# Patient Record
Sex: Male | Born: 1950
Health system: Southern US, Community
[De-identification: ages and names within clinical notes are randomized; demographics above are authoritative.]

## PROBLEM LIST (undated history)

## (undated) DIAGNOSIS — I10 Essential (primary) hypertension: Secondary | ICD-10-CM

## (undated) DIAGNOSIS — E119 Type 2 diabetes mellitus without complications: Secondary | ICD-10-CM

## (undated) DIAGNOSIS — E7800 Pure hypercholesterolemia, unspecified: Secondary | ICD-10-CM

## (undated) DIAGNOSIS — E1110 Type 2 diabetes mellitus with ketoacidosis without coma: Secondary | ICD-10-CM

## (undated) HISTORY — DX: Type 2 diabetes mellitus with ketoacidosis without coma: E11.10

## (undated) HISTORY — PX: KNEE SURGERY: SHX244

---

## 2004-09-25 ENCOUNTER — Other Ambulatory Visit: Payer: Self-pay

## 2004-09-25 ENCOUNTER — Emergency Department: Payer: Self-pay | Admitting: Emergency Medicine

## 2005-03-01 ENCOUNTER — Emergency Department: Payer: Self-pay | Admitting: Internal Medicine

## 2005-09-13 ENCOUNTER — Emergency Department: Payer: Self-pay | Admitting: Emergency Medicine

## 2005-10-05 ENCOUNTER — Ambulatory Visit: Payer: Self-pay | Admitting: Family Medicine

## 2006-05-31 DIAGNOSIS — E1040 Type 1 diabetes mellitus with diabetic neuropathy, unspecified: Secondary | ICD-10-CM | POA: Insufficient documentation

## 2006-06-07 DIAGNOSIS — I10 Essential (primary) hypertension: Secondary | ICD-10-CM | POA: Insufficient documentation

## 2006-06-28 ENCOUNTER — Ambulatory Visit: Payer: Self-pay | Admitting: Unknown Physician Specialty

## 2006-06-28 ENCOUNTER — Other Ambulatory Visit: Payer: Self-pay

## 2006-09-25 ENCOUNTER — Emergency Department: Payer: Self-pay | Admitting: Internal Medicine

## 2006-09-28 ENCOUNTER — Ambulatory Visit: Payer: Self-pay | Admitting: Ophthalmology

## 2006-09-28 IMAGING — CT CT ORBITS WITH CONTRAST
1 series · 15 of 30 positions shown, 19 images · non-contrast
Comparison: none

REASON FOR EXAM: orbittal cellulits    CALL report  [PHONE_NUMBER]
COMMENTS:

[Series 2: orbits_w 3.0 h30f · axial · 0.33mm/px · z∈[+364,+460]mm · 15 of 36 slices shown, 19 images]
[im 2/36  brain]
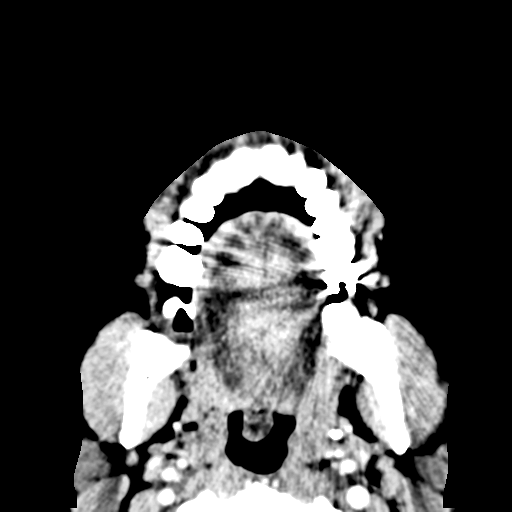
[im 2/36  bone]
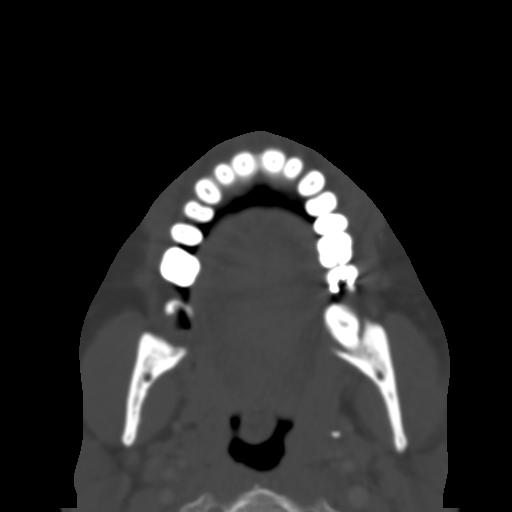
[im 4/36  bone]
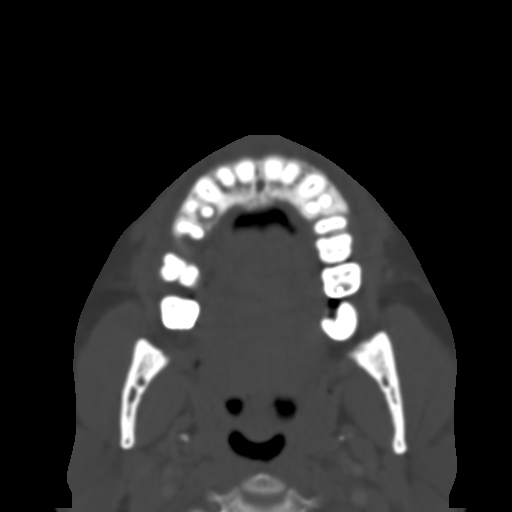
[im 7/36  bone]
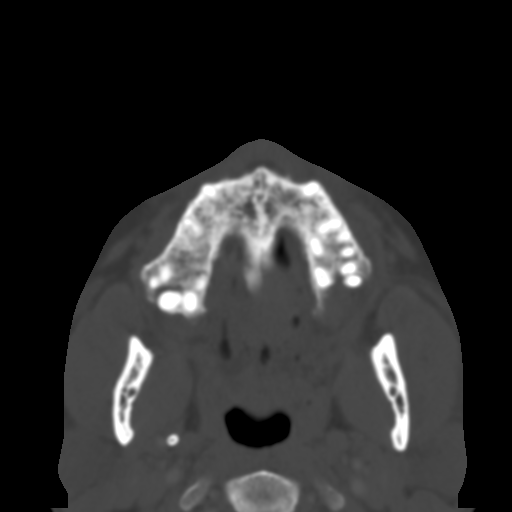
[im 9/36  bone]
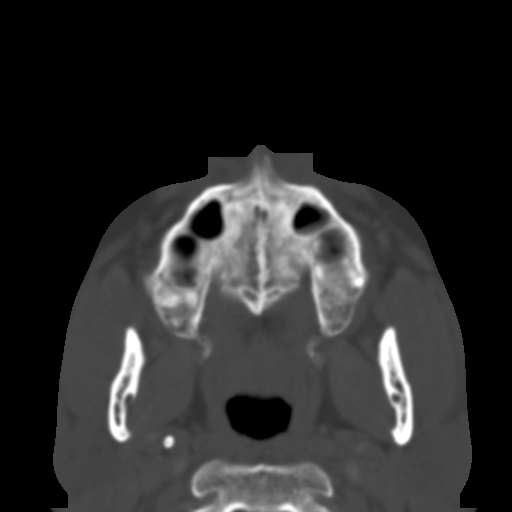
[im 11/36  brain]
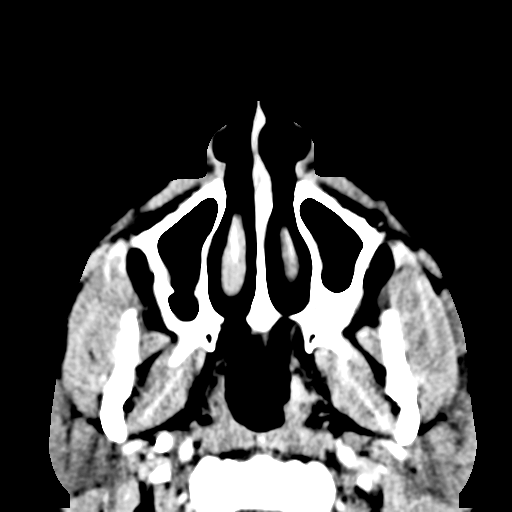
[im 11/36  bone]
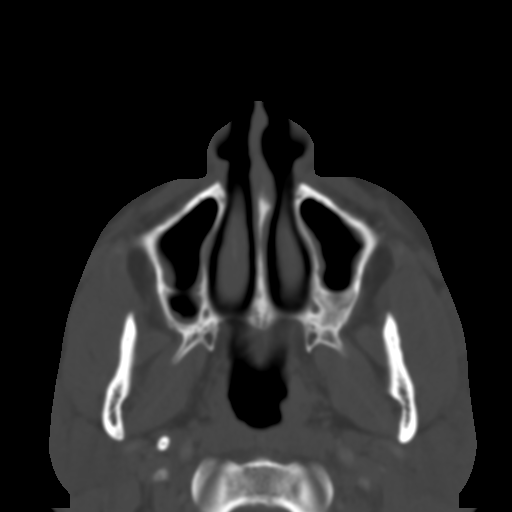
[im 14/36  bone]
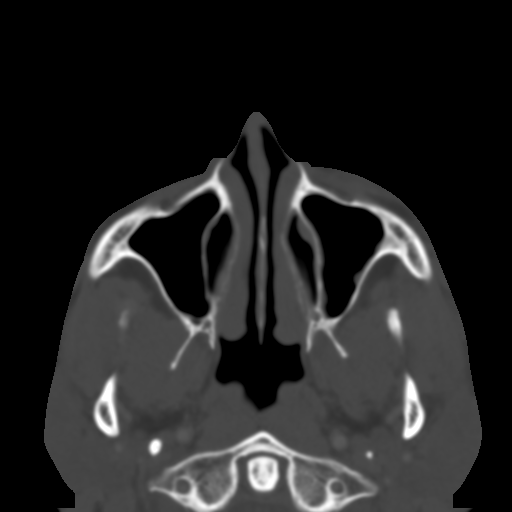
[im 16/36  bone]
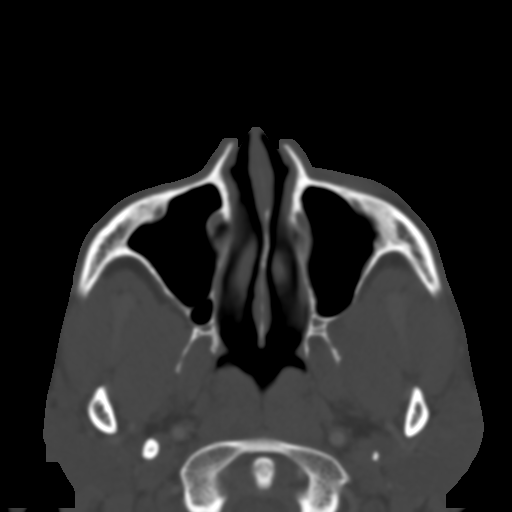
[im 19/36  bone]
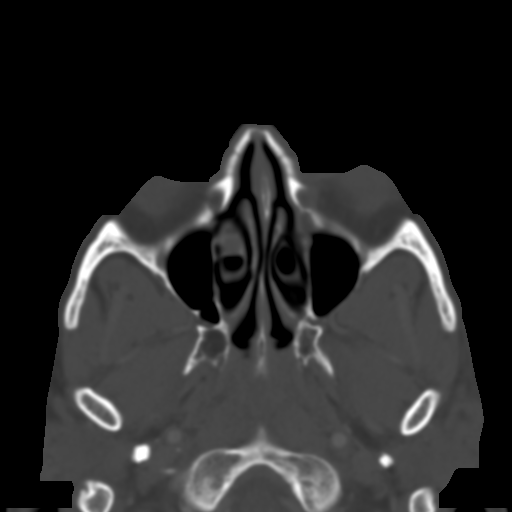
[im 20/36  brain]
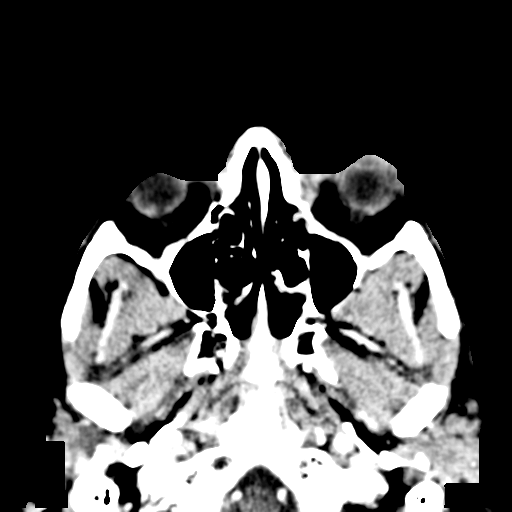
[im 20/36  bone]
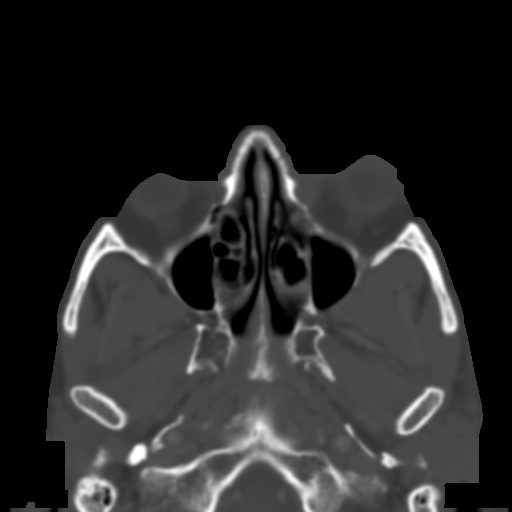
[im 22/36  bone]
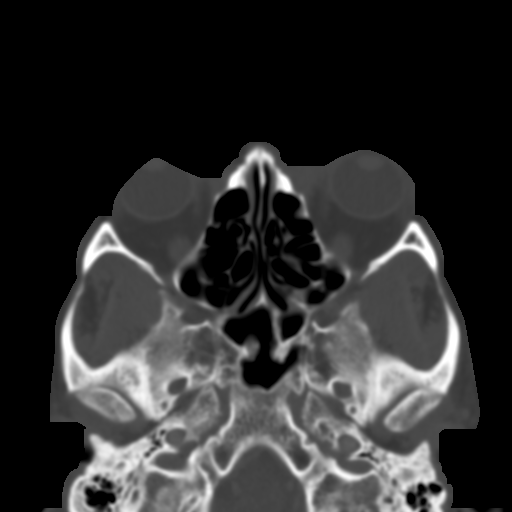
[im 25/36  bone]
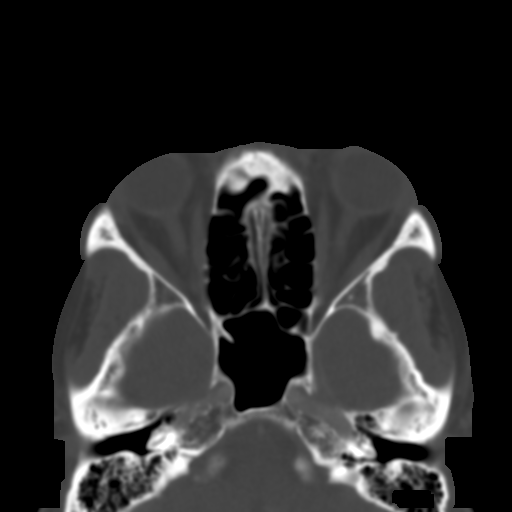
[im 27/36  bone]
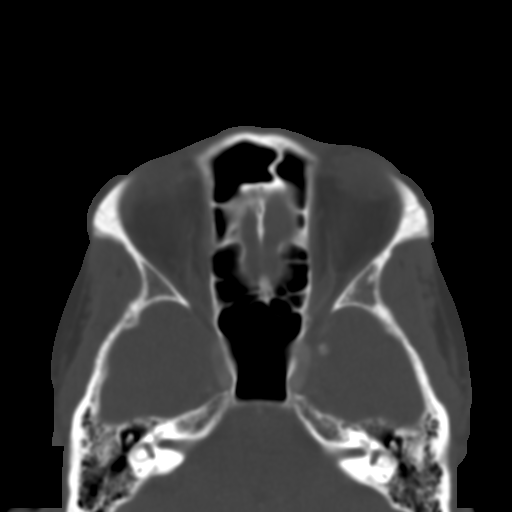
[im 29/36  brain]
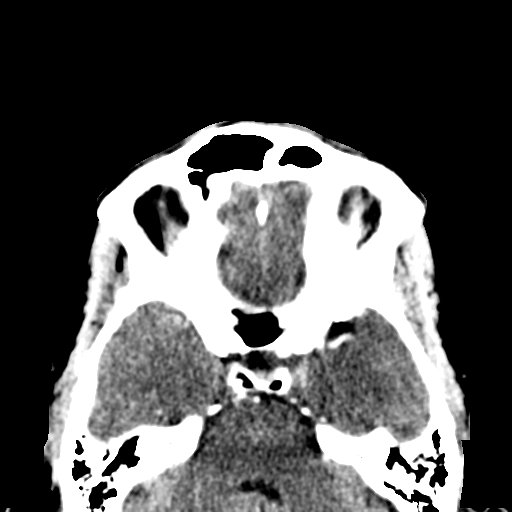
[im 29/36  bone]
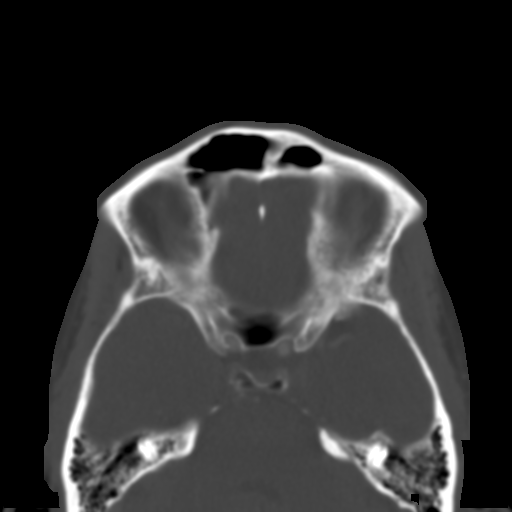
[im 32/36  bone]
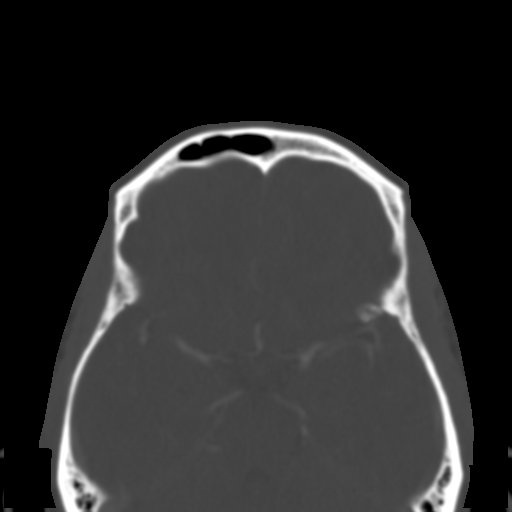
[im 34/36  bone]
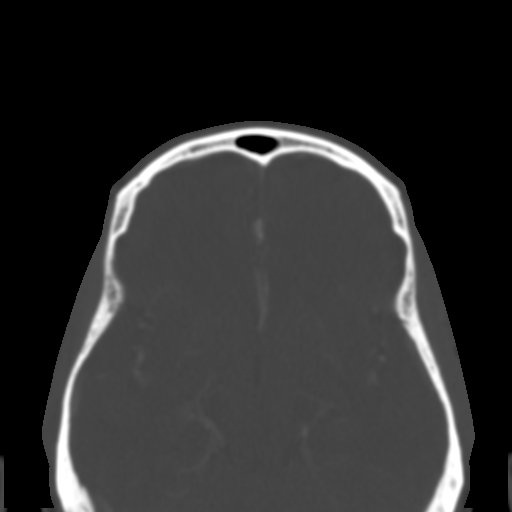

[15 of 30 positions shown; findings below may reference images not displayed]

PROCEDURE:     CT  - CT ORBITS OR TEMPORAL BONE W  - [DATE]  [DATE]

RESULT:     Patient has clinical diagnosis of orbital cellulitis on the
left. The patient received 60 cc of [VS].

There is inflammatory change in the preseptal compartment on the left. No
post septal inflammatory changes seen. The globe appears intact. There is no
more than minimal proptosis though clinically there  may appear to be more
due to swelling of the lids. I do not see findings to suggest periosteal
enhancement or subperiosteal abscess formation. The optic nerve and
extraocular muscles appear normal. At bone window settings there are no
air-fluid levels in the sinuses. A small amount of mucoperiosteal thickening
is seen involving the left maxillary sinus. The right maxillary sinus
exhibits minimal mucoperiosteal thickening inferiorly as well. The frontal
sinuses are clear.
IMPRESSION: 1. There is evidence of preseptal inflammation of the left orbit consistent
with cellulitis. No soft tissue gas is seen and no discrete abscess is
identified.
2. No postseptal abnormality is identified.
3. There is minimal mucoperiosteal thickening of the maxillary sinuses but
no air-fluid levels.

This report was called to the [facility] at [DATE] p.m. on [DATE]

## 2007-09-07 ENCOUNTER — Emergency Department: Payer: Self-pay | Admitting: Emergency Medicine

## 2007-09-07 IMAGING — CR RIGHT FOOT COMPLETE - 3+ VIEW
1 series · 3 of 3 positions shown · non-contrast
Comparison: none

REASON FOR EXAM: diabetic with breakdown
COMMENTS:

PROCEDURE:     DXR - DXR FOOT RT COMPLETE W/OBLIQUES  - [DATE] [DATE]
RESULT:     There does not appear to be evidence of fracture or dislocation.
 An exostosis is identified along the superior aspect of the talus.

[Series 1: view not recorded · 0.17mm/px · 3 of 3 slices shown]
[im 1/3]
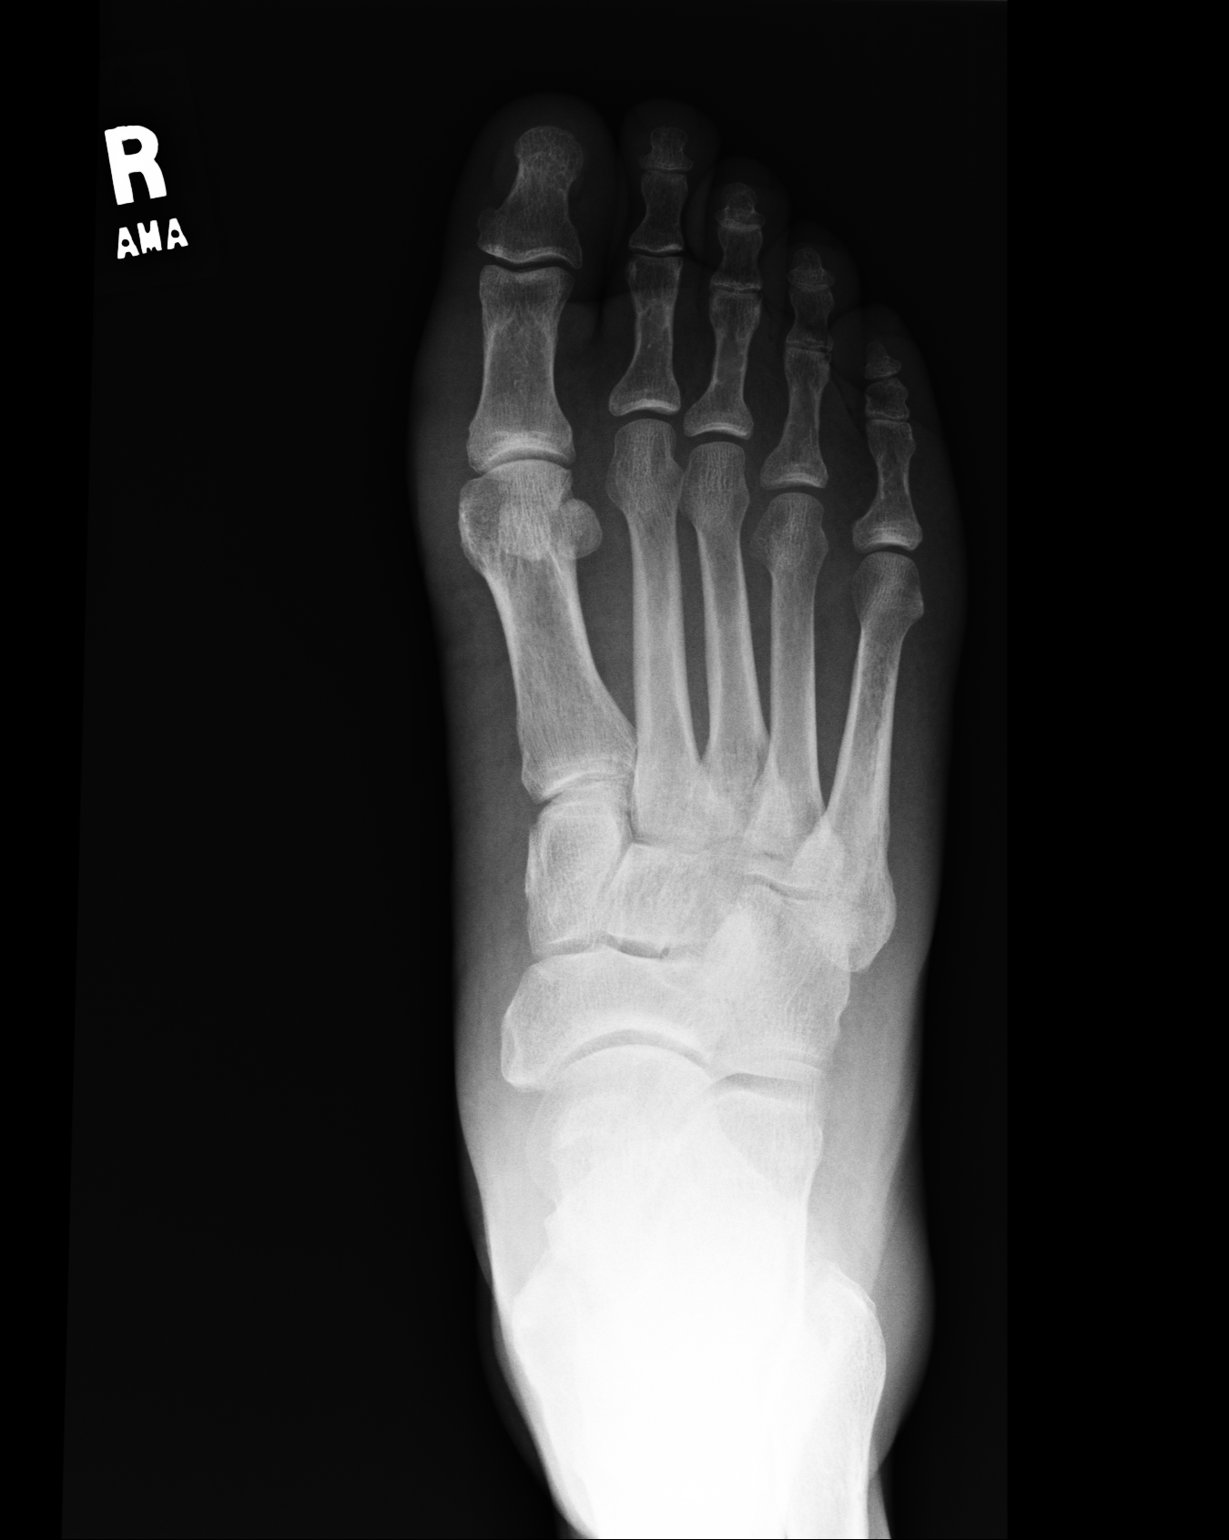
[im 2/3]
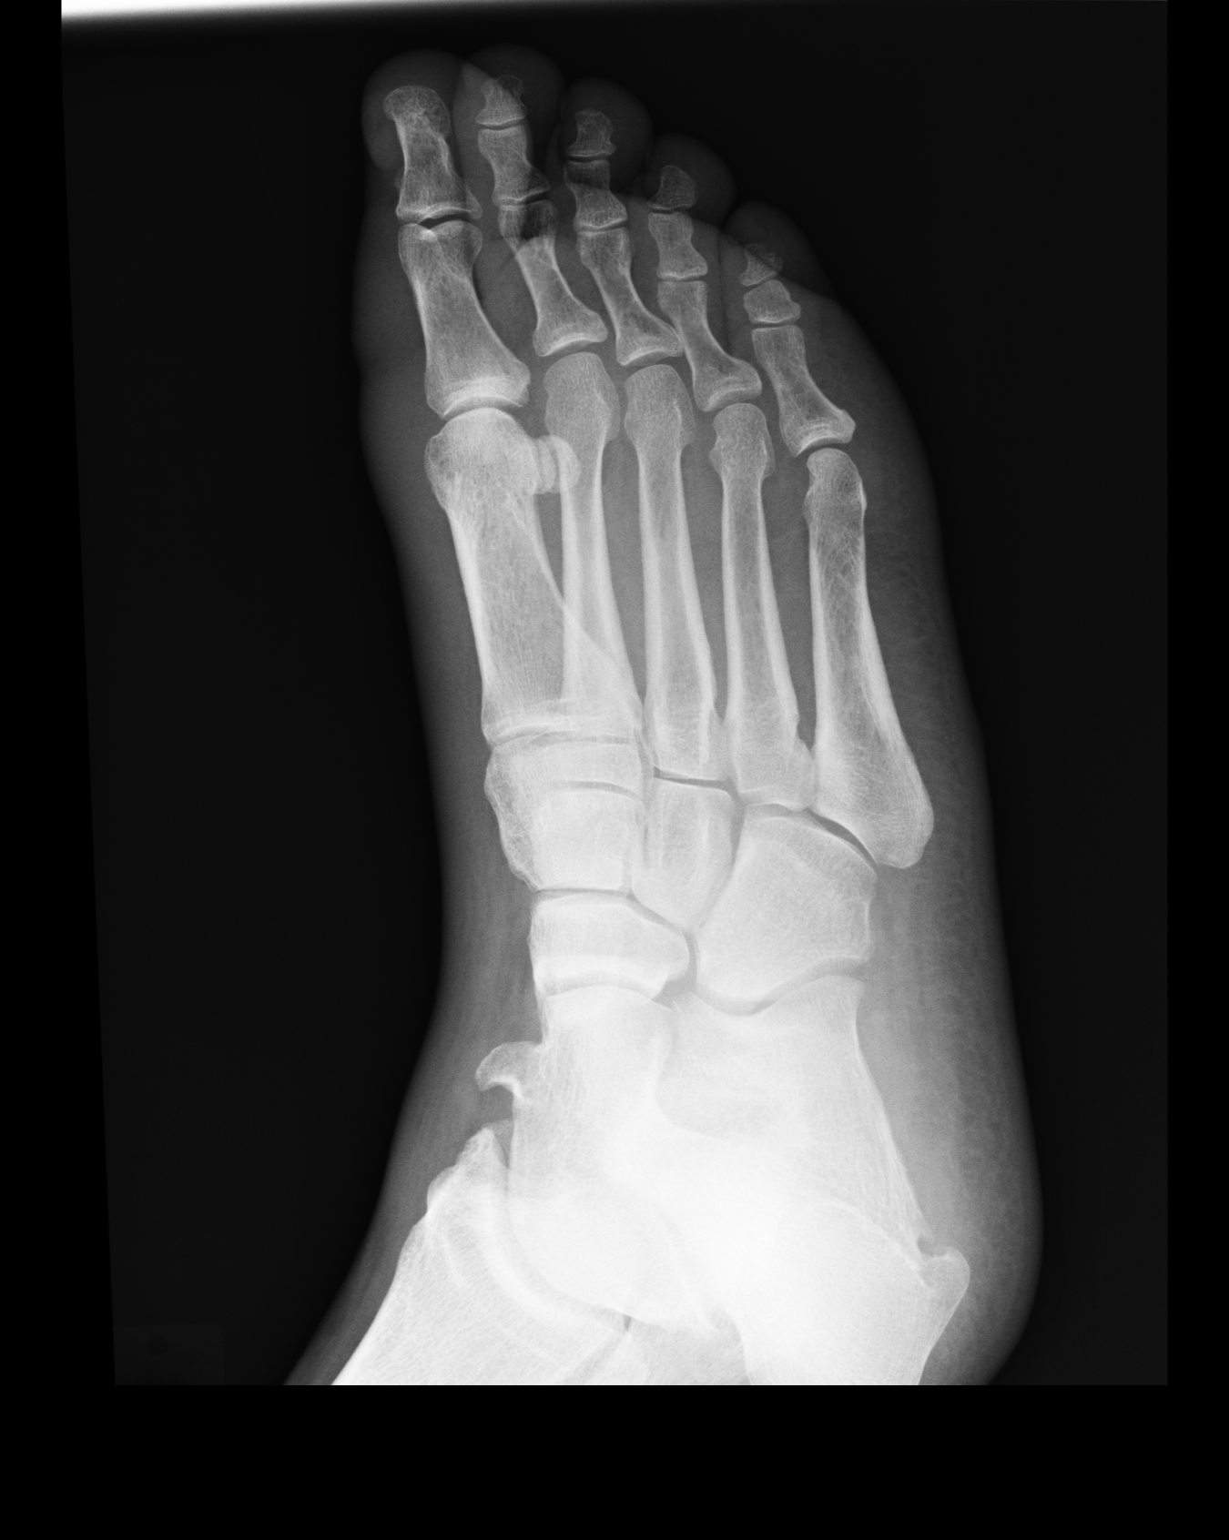
[im 3/3]
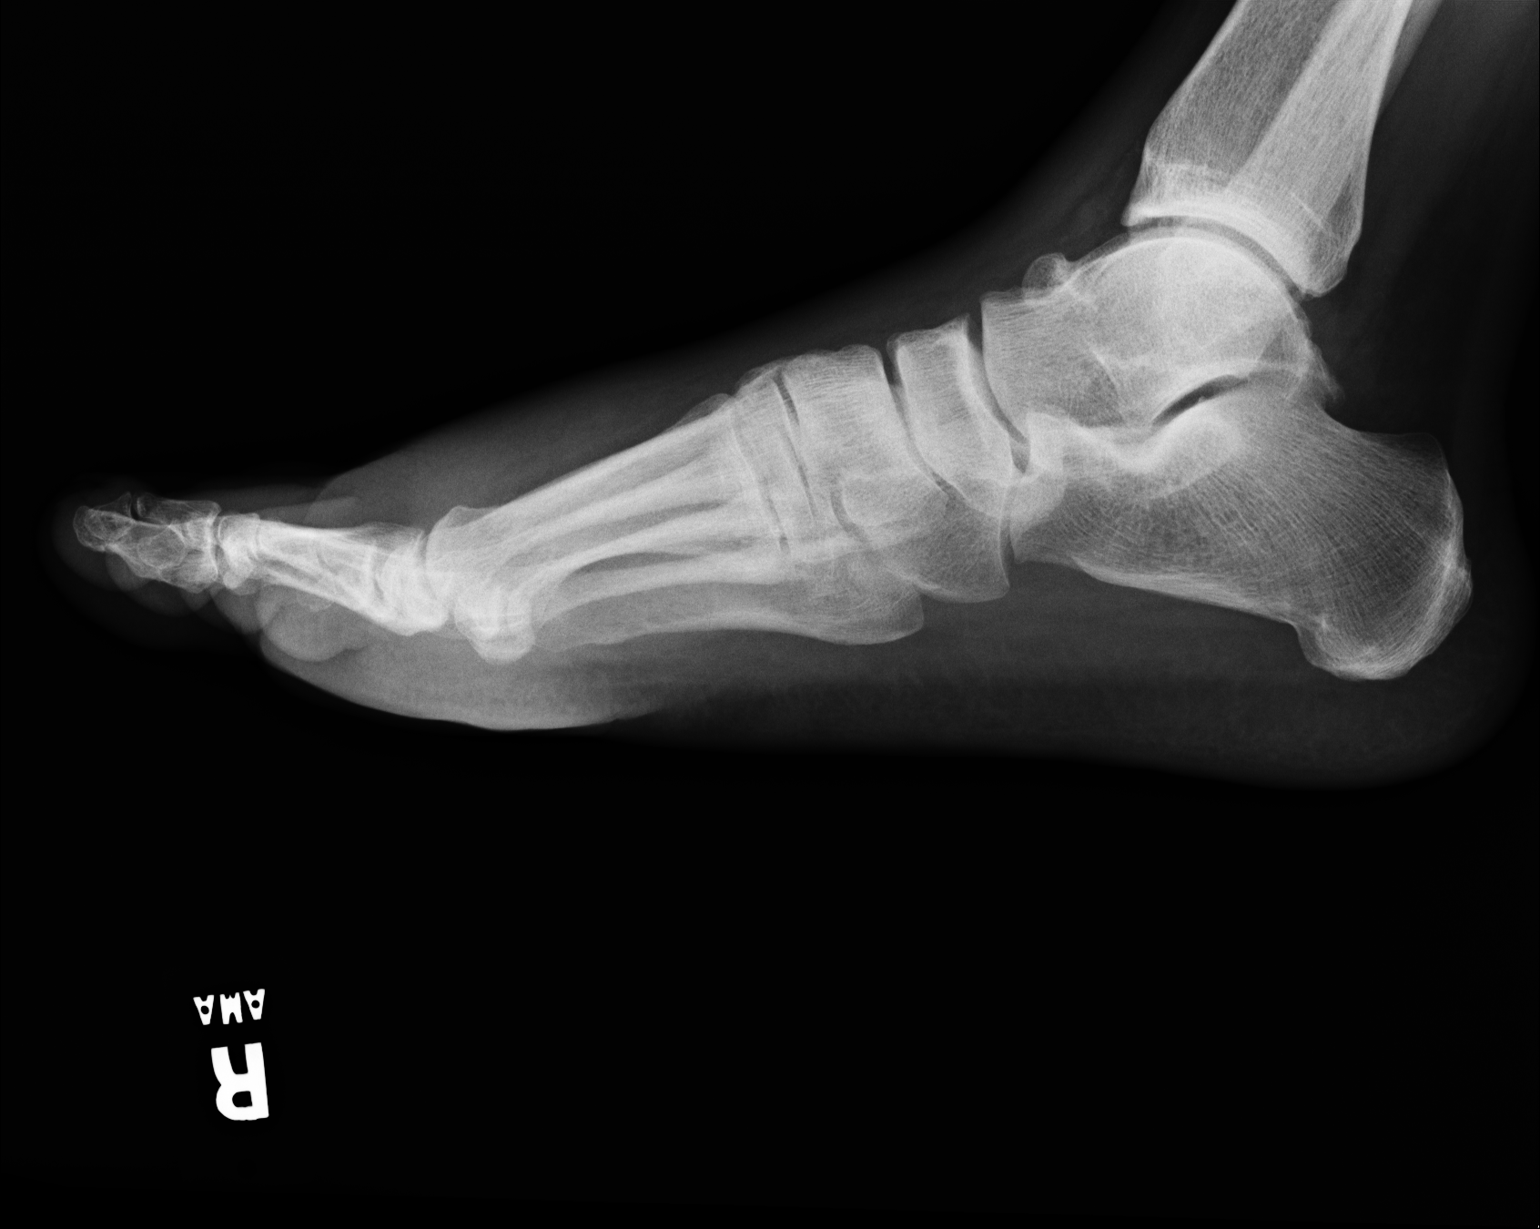

[3 of 3 positions shown; findings below may reference images not displayed]

IMPRESSION: No evidence of acute osseous abnormalities. If there is persistent clinical
concern, repeat evaluation in 7-10 days is recommended if clinically
warranted.

## 2008-08-13 ENCOUNTER — Emergency Department: Payer: Self-pay | Admitting: Emergency Medicine

## 2008-08-13 IMAGING — CR DG SHOULDER 3+V*L*
1 series · 4 of 4 positions shown · non-contrast
Comparison: none

REASON FOR EXAM: painful shoulder after moving some rims waiting room
COMMENTS:

PROCEDURE:     DXR - DXR SHOULDER LEFT COMPLETE  - [DATE]  [DATE]
RESULT:     No fracture, dislocation or other acute bony abnormality is
identified.

[Series 1: view not recorded · 0.17mm/px · 4 of 4 slices shown]
[im 1/4]
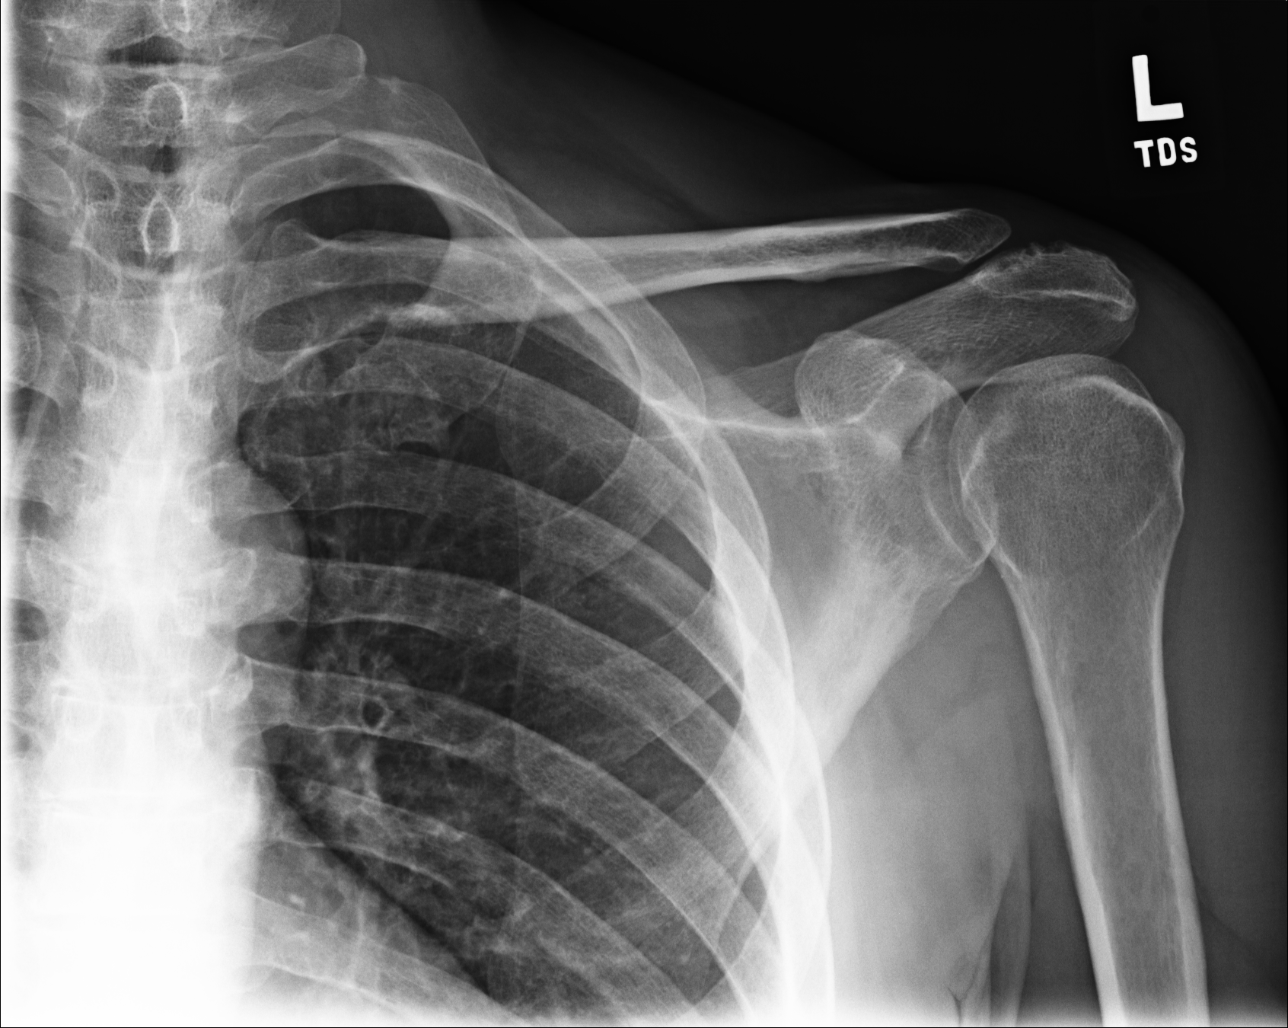
[im 2/4]
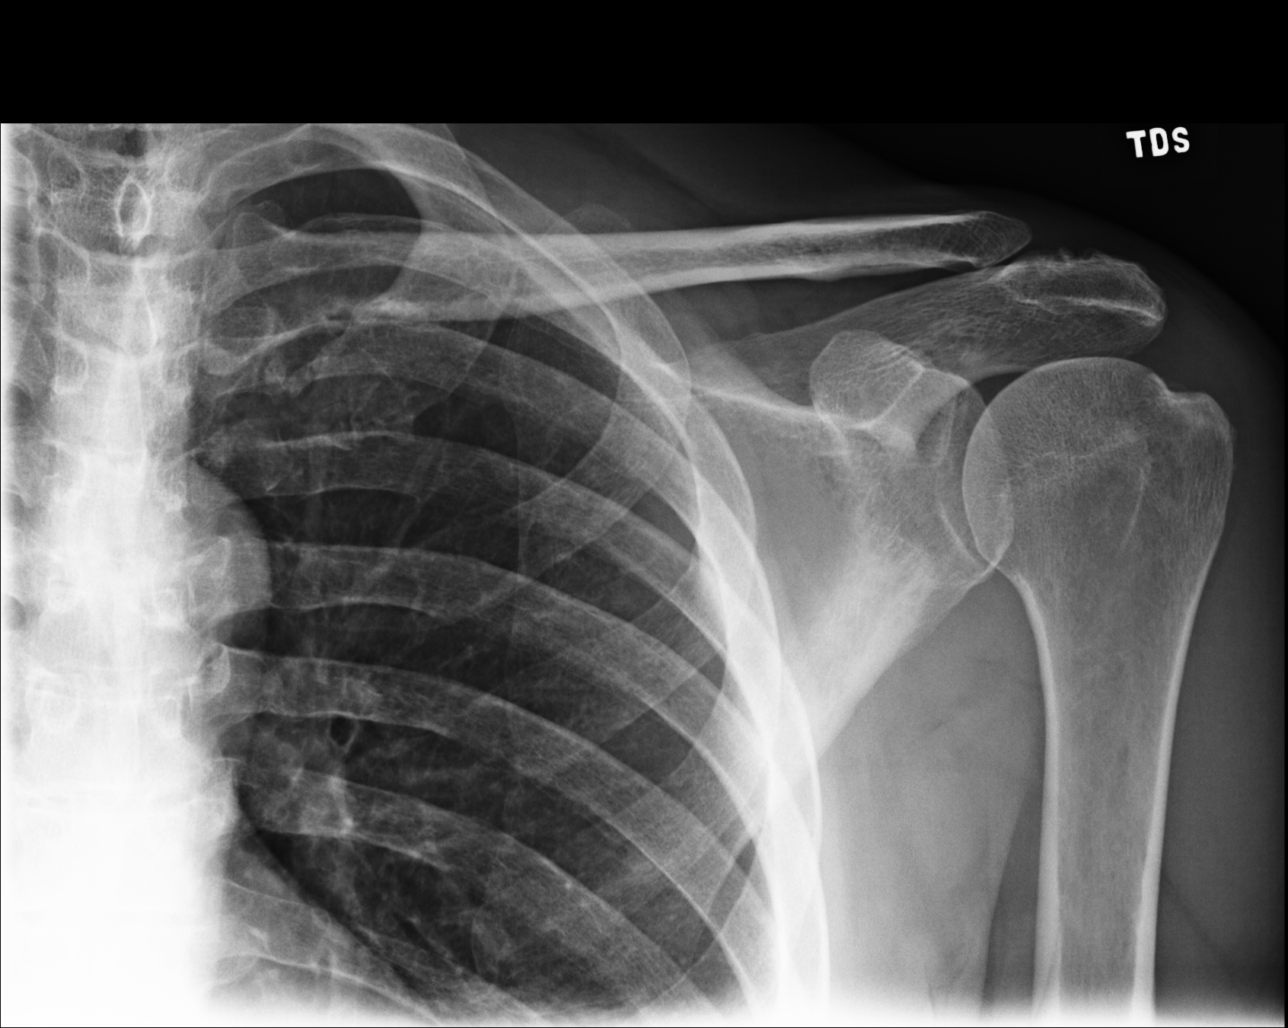
[im 3/4]
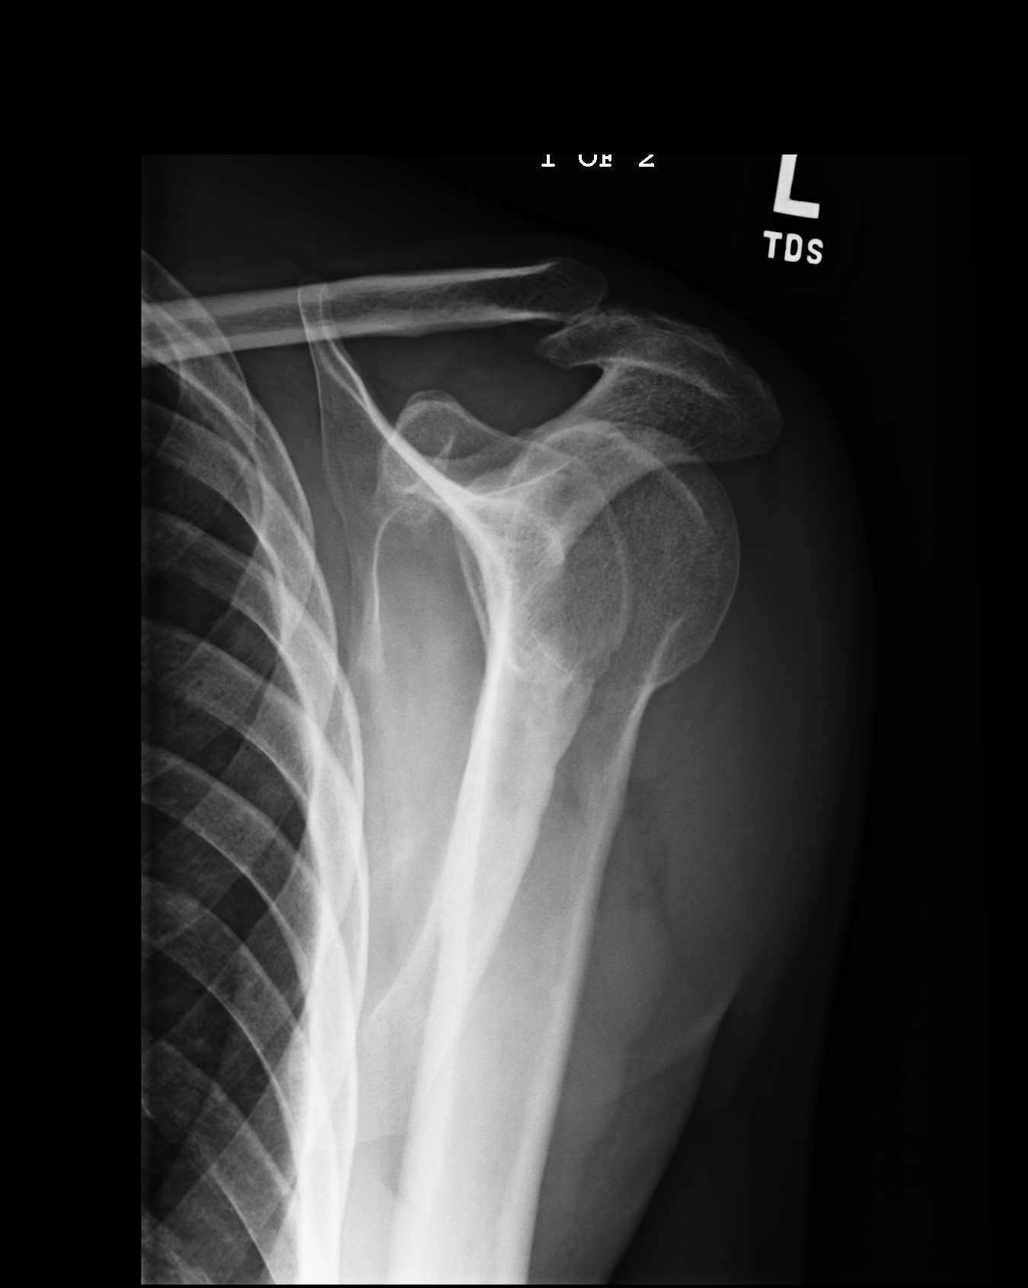
[im 4/4]
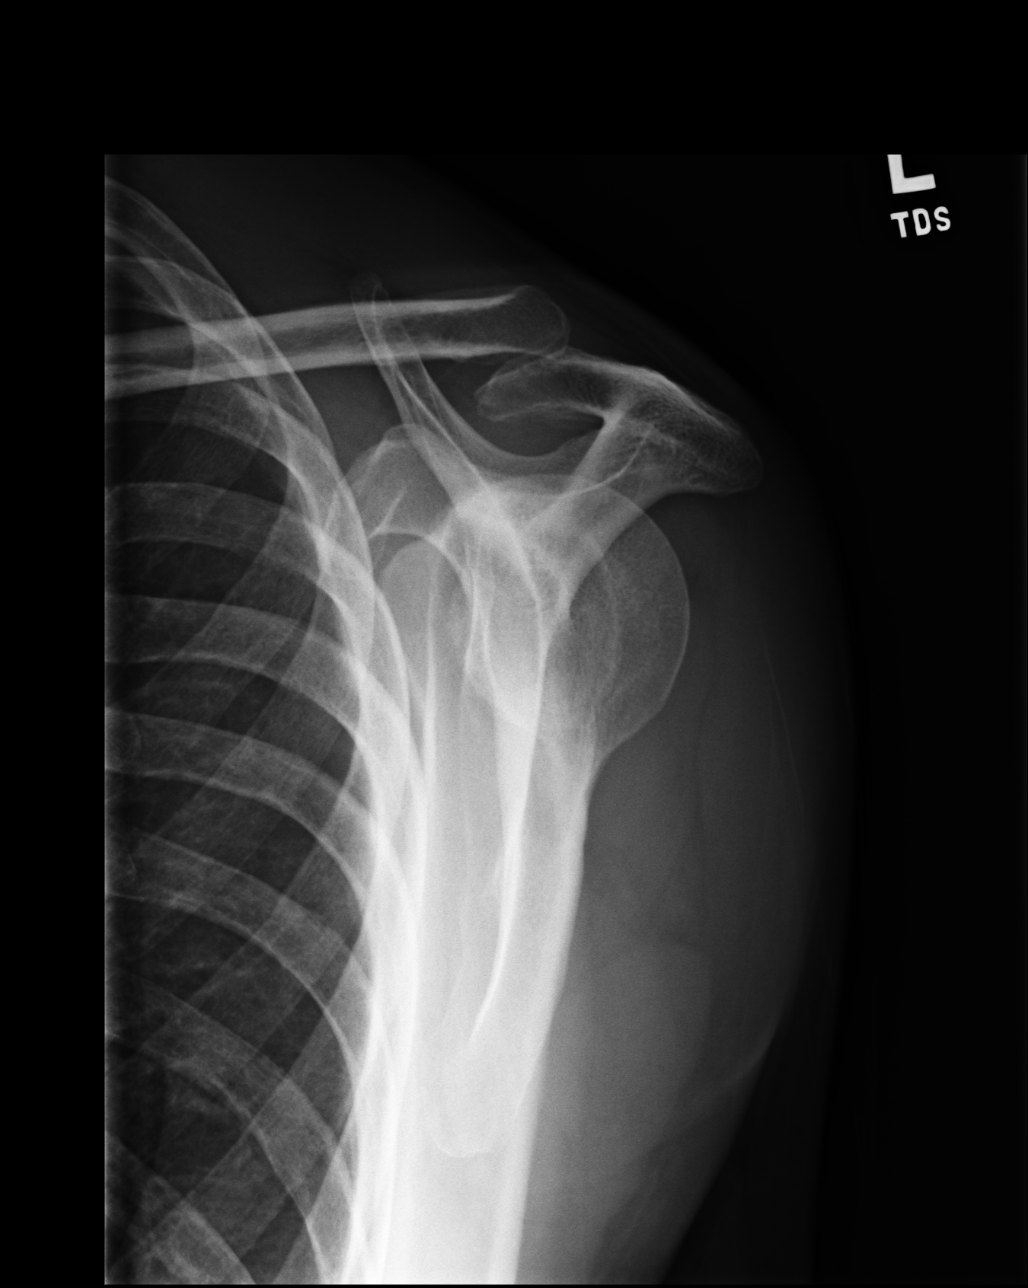

[4 of 4 positions shown; findings below may reference images not displayed]

IMPRESSION: No acute changes are identified.

## 2009-04-27 ENCOUNTER — Emergency Department: Payer: Self-pay | Admitting: Internal Medicine

## 2012-01-01 ENCOUNTER — Emergency Department: Payer: Self-pay | Admitting: Emergency Medicine

## 2012-03-15 ENCOUNTER — Emergency Department: Payer: Self-pay | Admitting: Emergency Medicine

## 2012-03-15 IMAGING — CR PELVIS - 1-2 VIEW
1 series · 1 of 1 positions shown · non-contrast
Comparison: none

REASON FOR EXAM: left hip pain
COMMENTS:   May transport without cardiac monitor

PROCEDURE:     DXR - DXR PELVIS AP ONLY  - [DATE]  [DATE]
RESULT:     Single view of the pelvis shows no fracture, dislocation or
foreign body. Atherosclerotic calcification is present. No significant
degenerative disease is evident. Femoral heads are smoothly marginated and
located properly.

[ap]
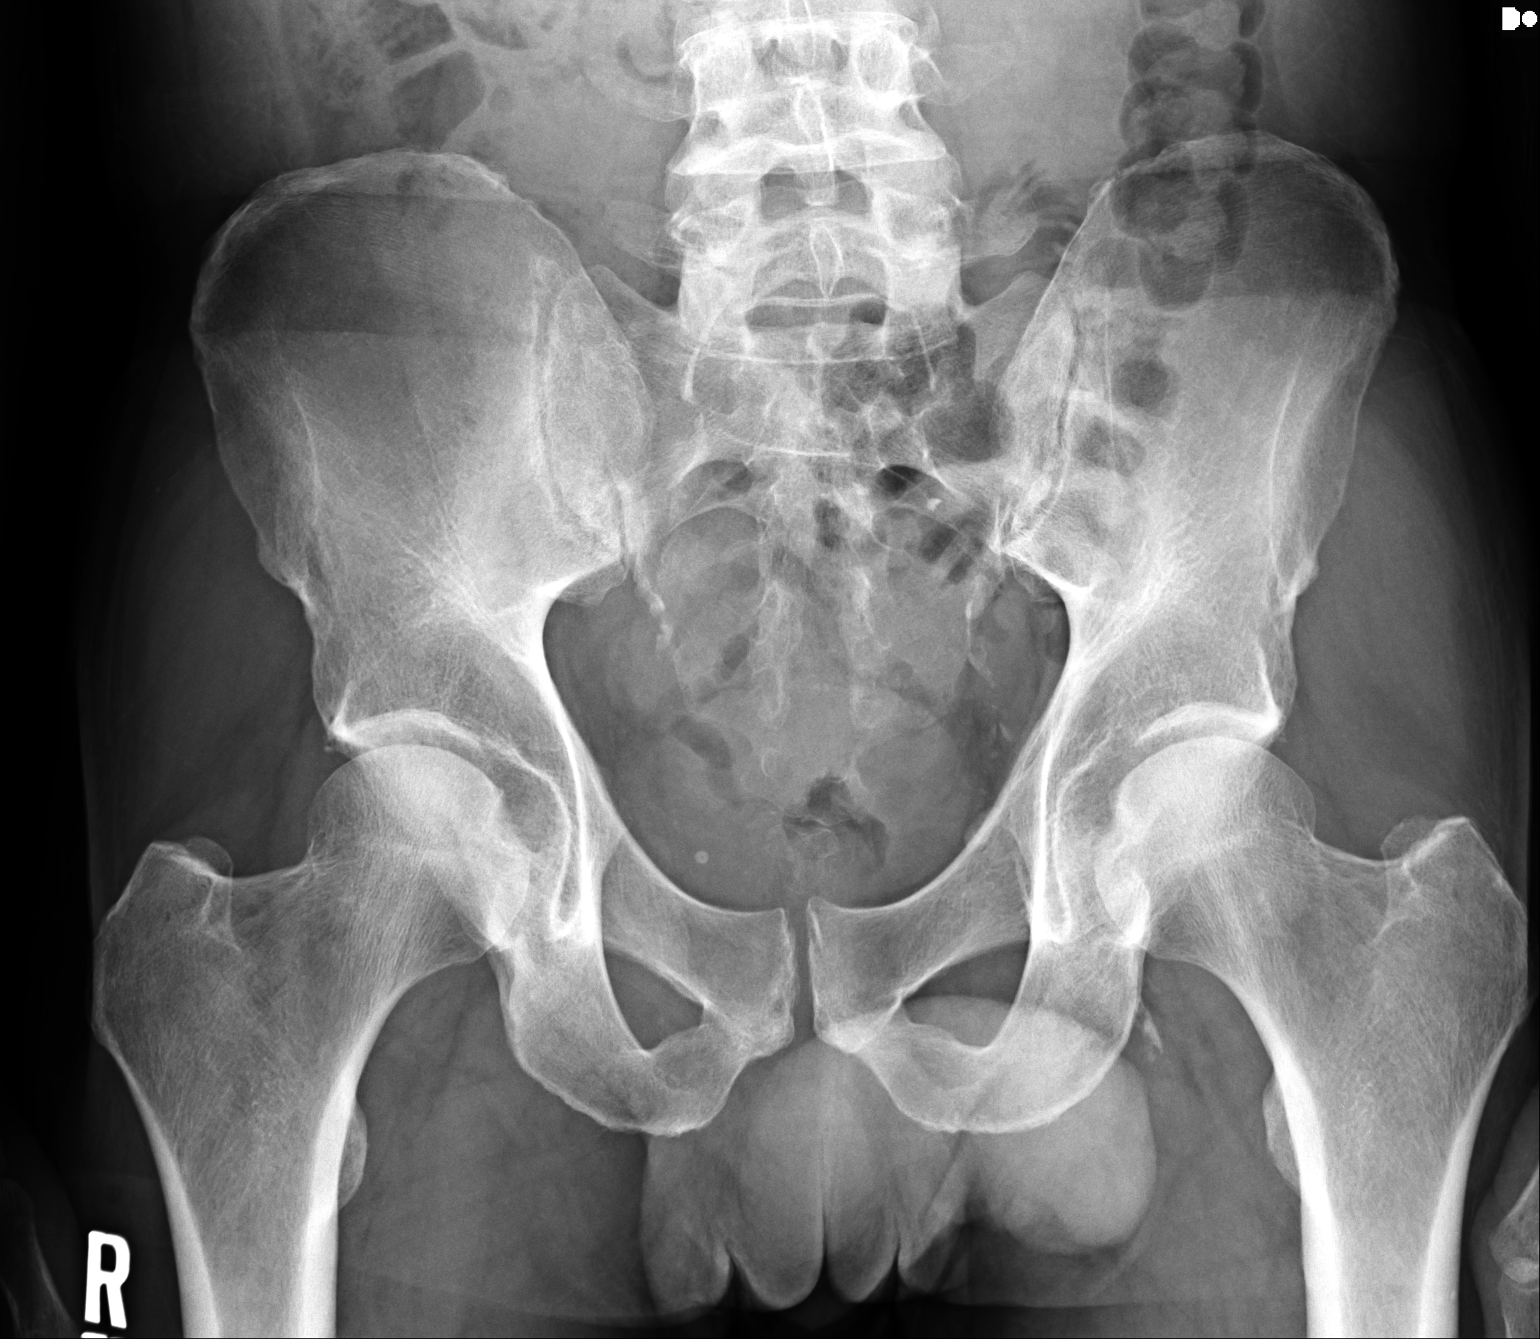

[1 of 1 positions shown; findings below may reference images not displayed]

IMPRESSION: No fracture or dislocation evident.

[REDACTED]

## 2012-03-15 IMAGING — CR DG HIP COMPLETE 2+V*L*
1 series · 2 of 2 positions shown · non-contrast
Comparison: none

REASON FOR EXAM: acute onset pain
COMMENTS:   May transport without cardiac monitor

PROCEDURE:     DXR - DXR HIP LEFT COMPLETE  - [DATE]  [DATE]
RESULT:     AP and frog leg views of the left hip show the femoral head is
smoothly marginated and located in the acetabulum. There is no fracture. No
foreign body is evident.

[Series 1: ap · 0.17mm/px · 2 of 2 slices shown]
[im 1/2]
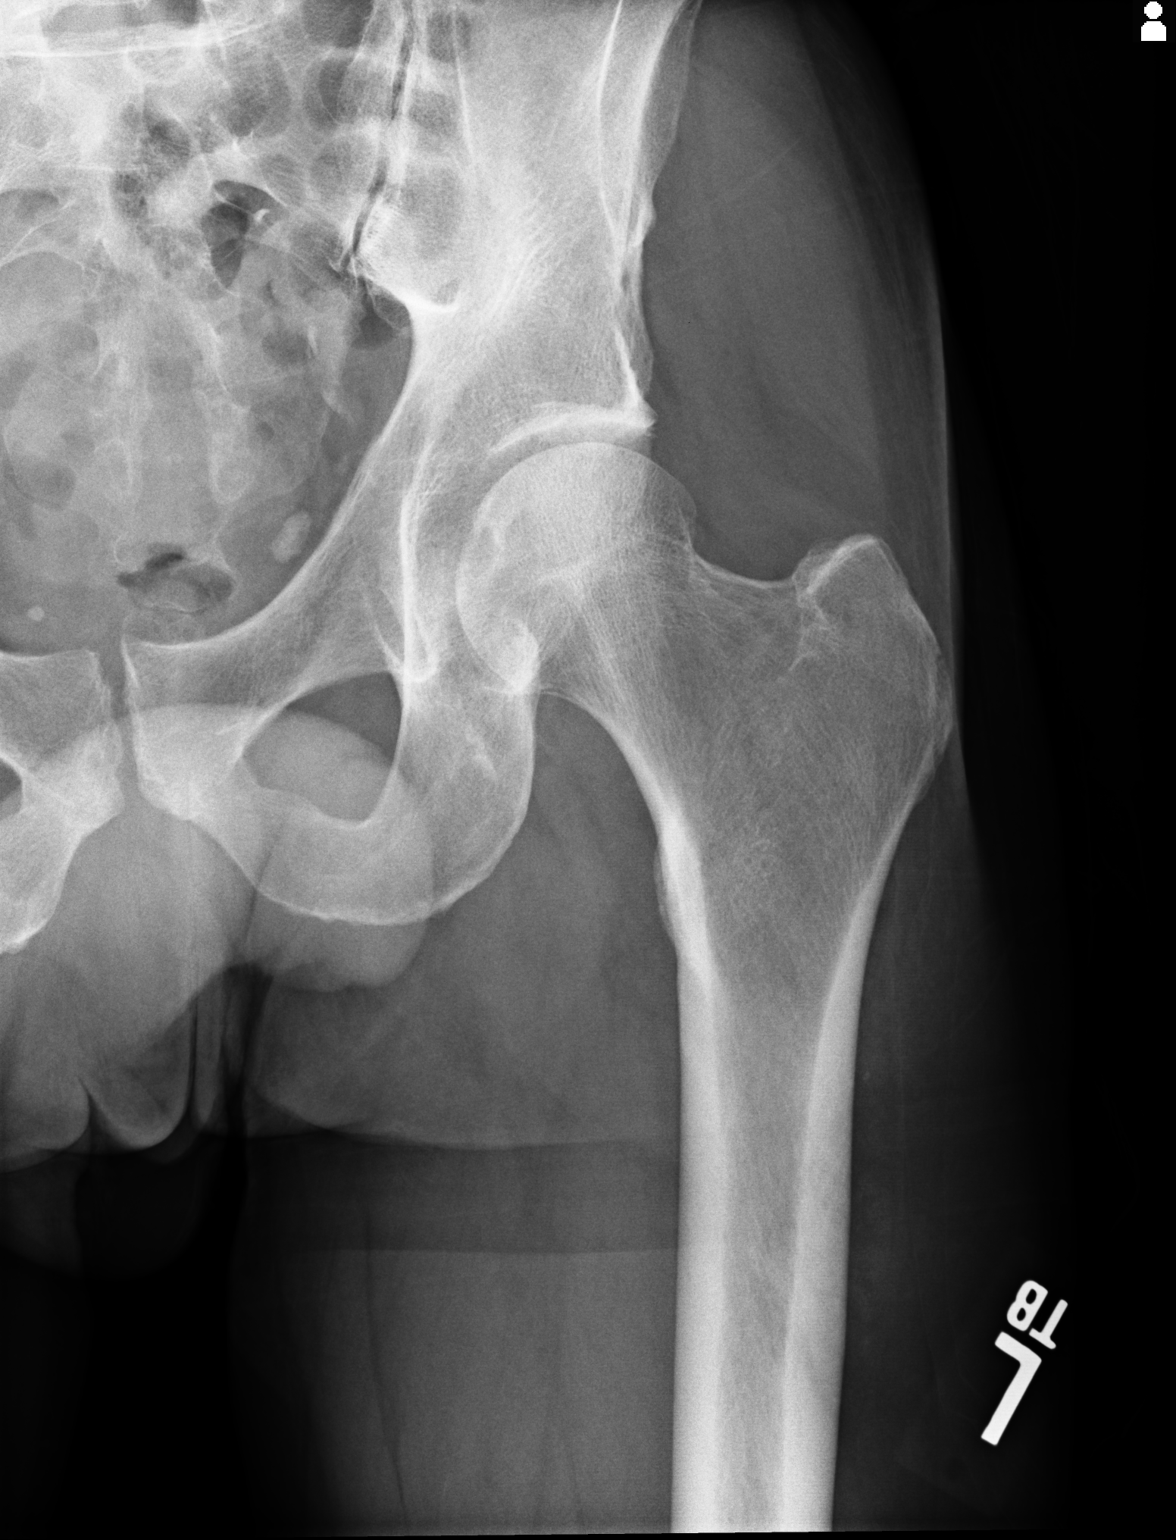
[im 2/2]
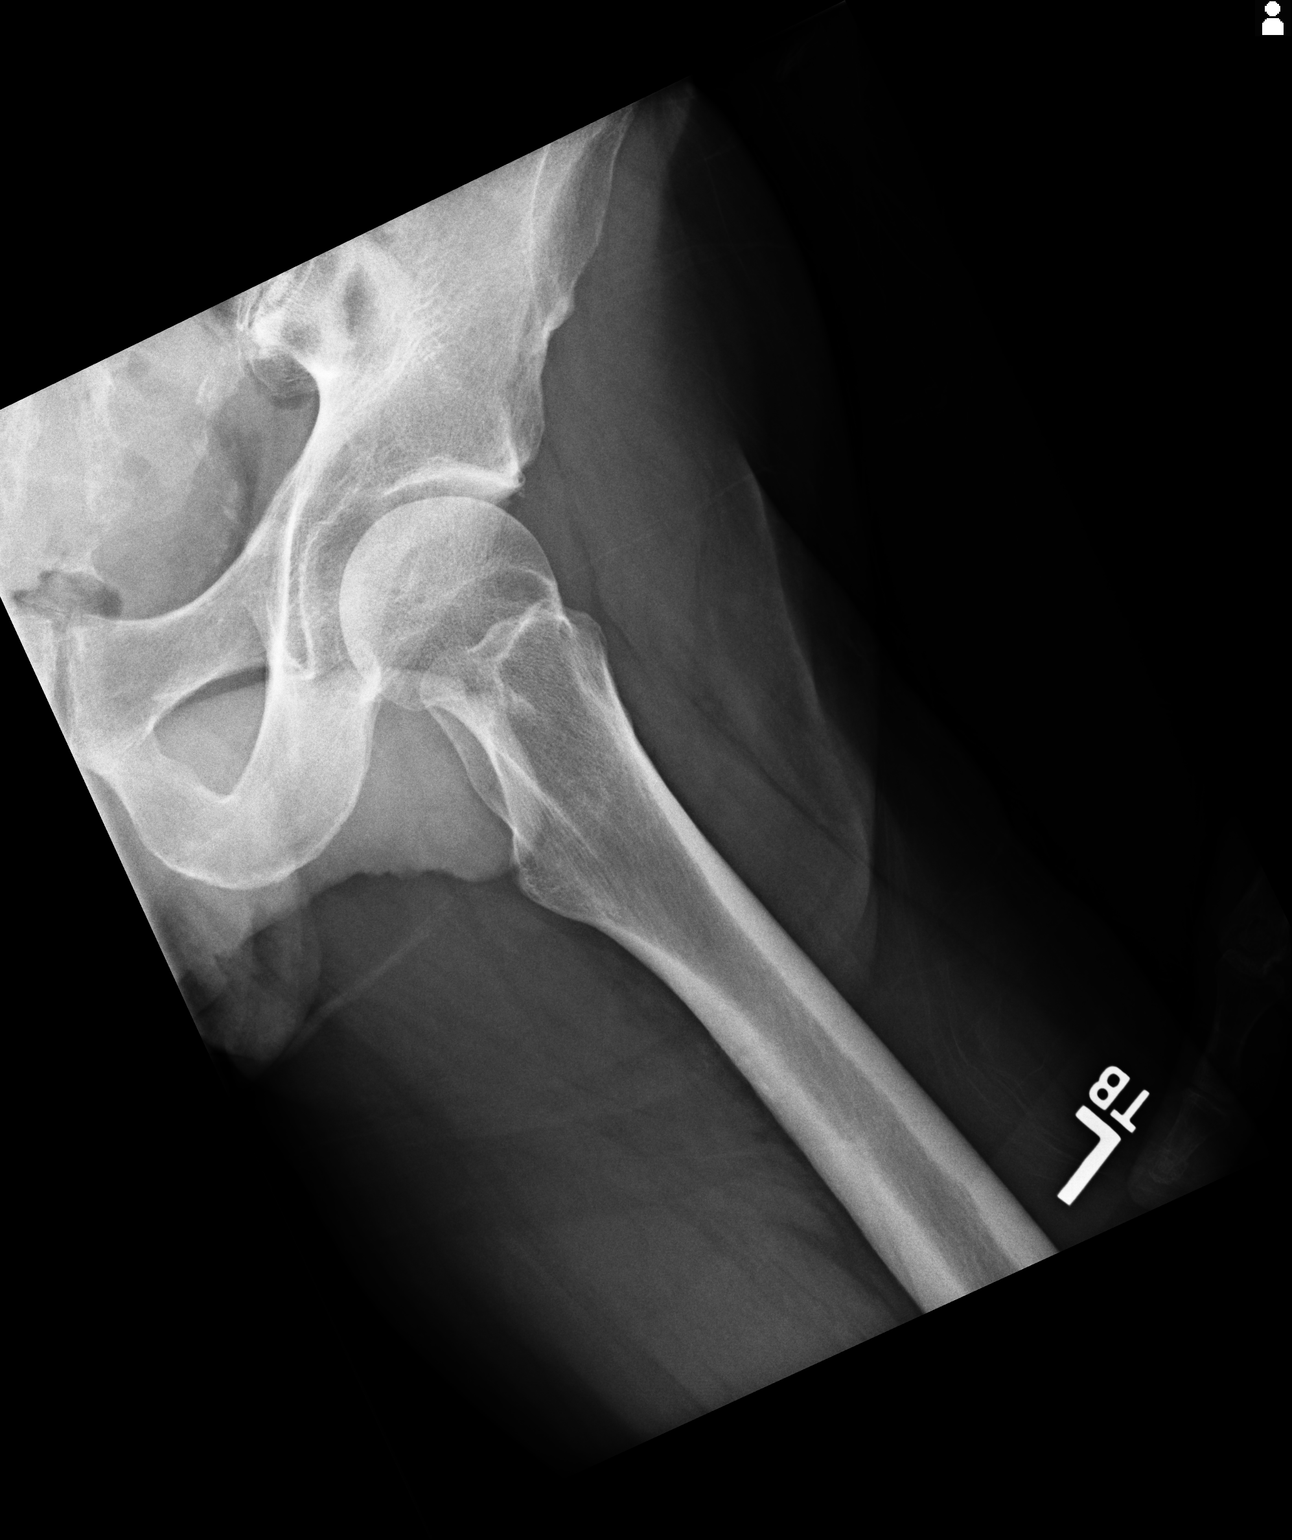

[2 of 2 positions shown; findings below may reference images not displayed]

IMPRESSION: No acute bony abnormality evident. MRI can be performed for
further assessment if clinically indicated.

[REDACTED]

## 2012-08-12 ENCOUNTER — Ambulatory Visit: Payer: Self-pay | Admitting: Family Medicine

## 2012-08-12 IMAGING — CR DG HIP COMPLETE 2+V*L*
1 series · 2 of 2 positions shown · non-contrast
Comparison: none

REASON FOR EXAM: pain
COMMENTS:

PROCEDURE:     KDR - KDXR HIP LEFT COMPLETE  - [DATE] [DATE]
RESULT:     There is atherosclerotic calcification present. There is no
fracture, dislocation or radiopaque foreign body.

[Series 1: ap · 0.17mm/px · 2 of 2 slices shown]
[im 1/2]
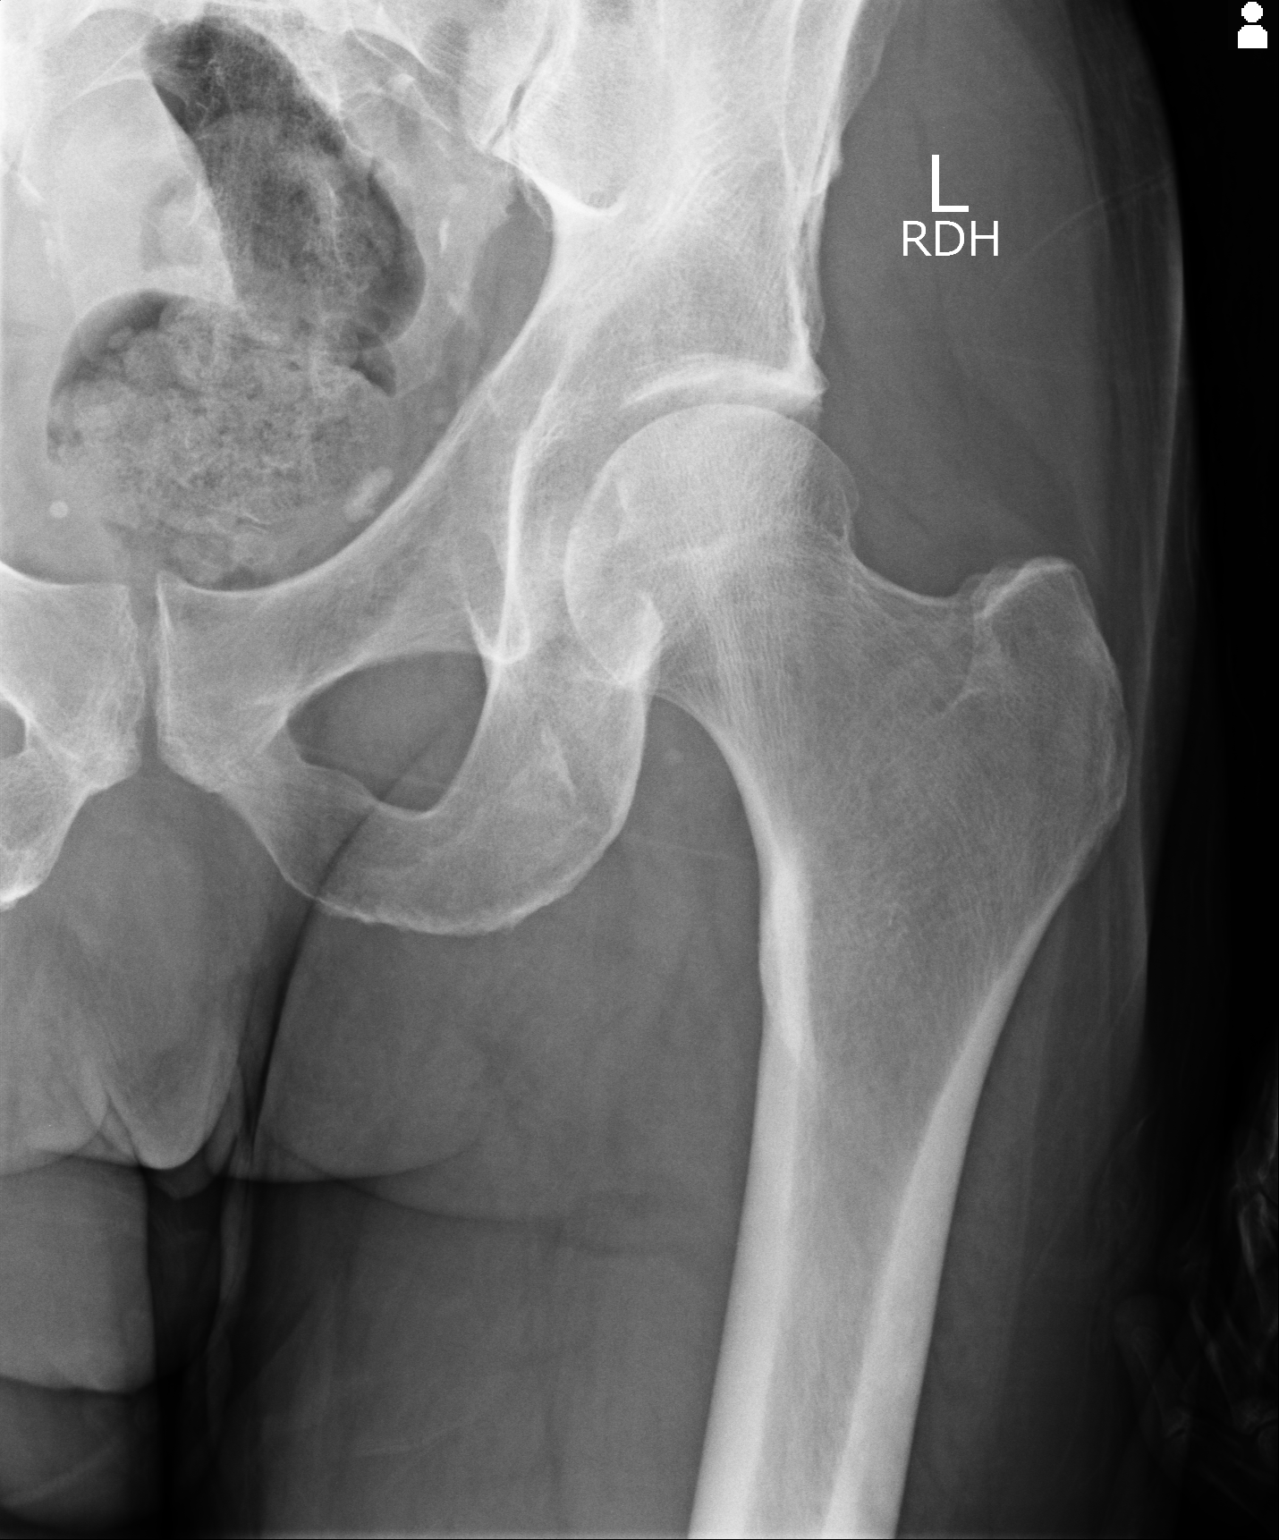
[im 2/2]
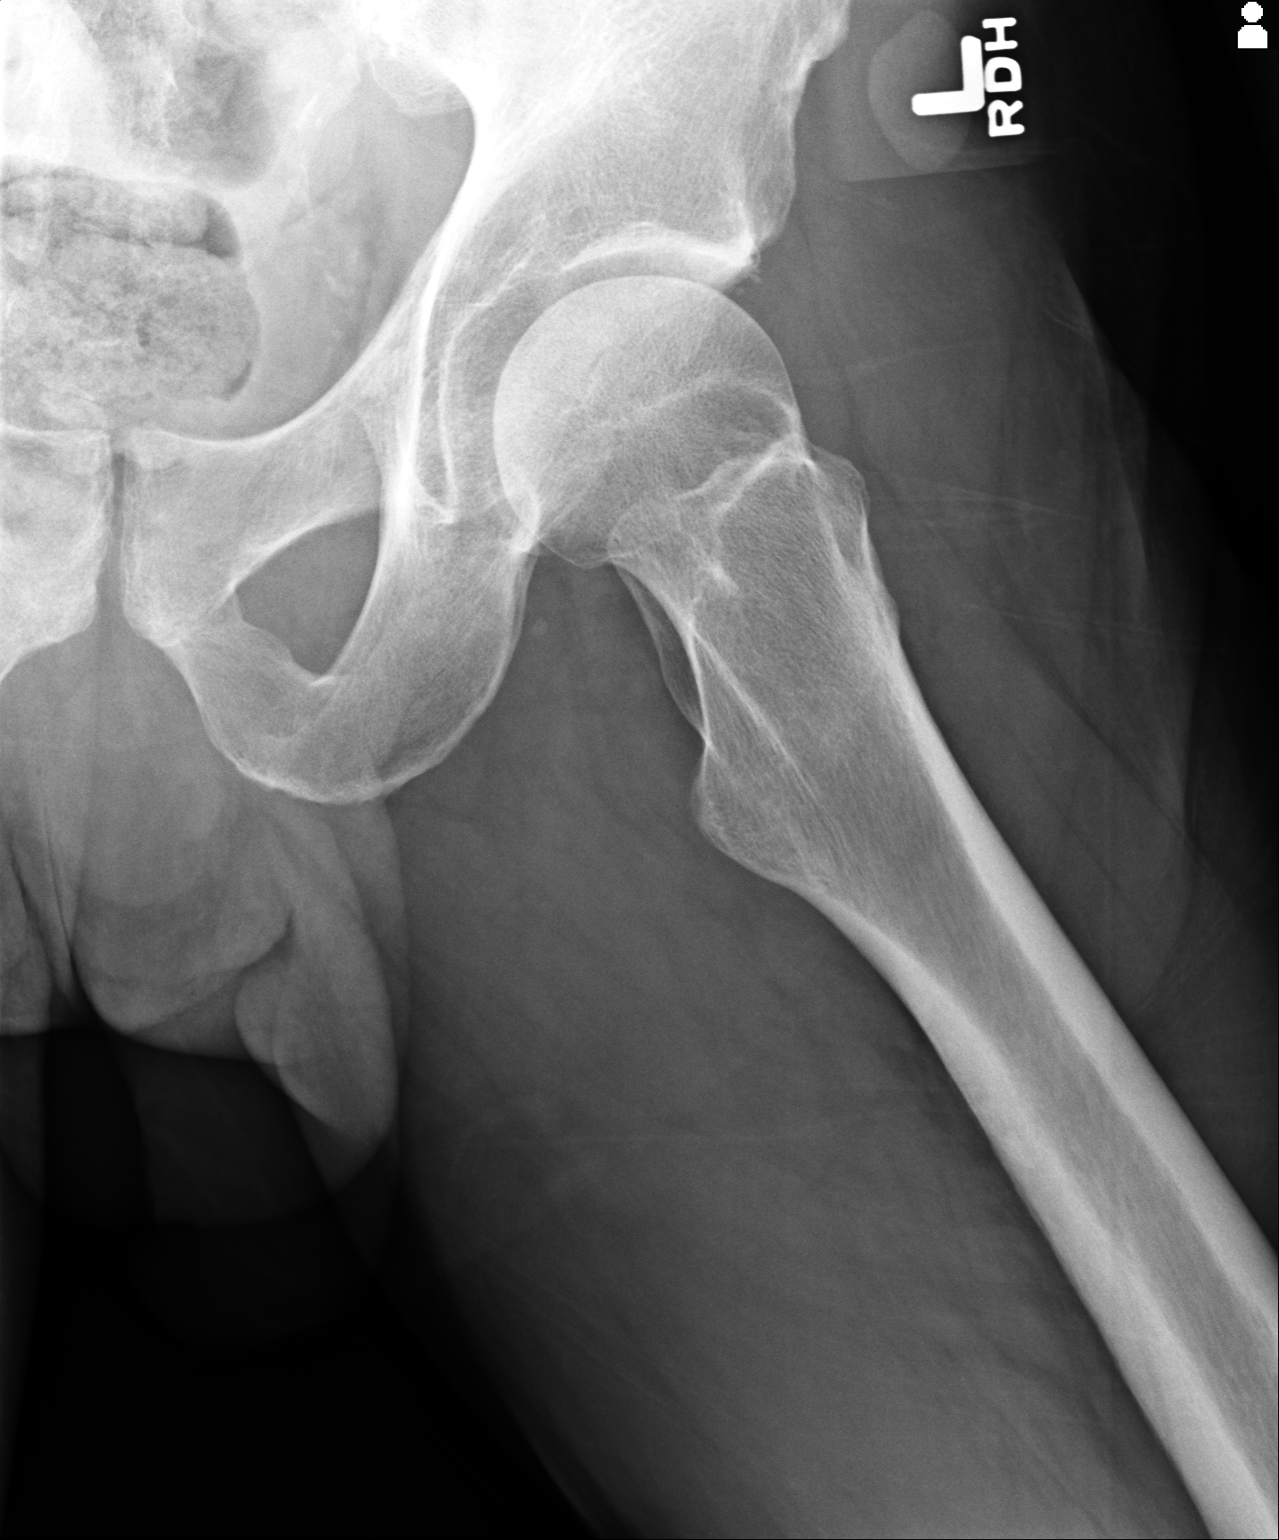

[2 of 2 positions shown; findings below may reference images not displayed]

IMPRESSION: No acute bony abnormality of the left hip. MRI followup is
available for further assessment if desired.

[REDACTED]

## 2012-08-12 IMAGING — CR DG LUMBAR SPINE 2-3V
1 series · 3 of 3 positions shown · non-contrast
Comparison: none

REASON FOR EXAM: pain
COMMENTS:

[Series 1: ap · 0.17mm/px · 3 of 3 slices shown]
[im 1/3]
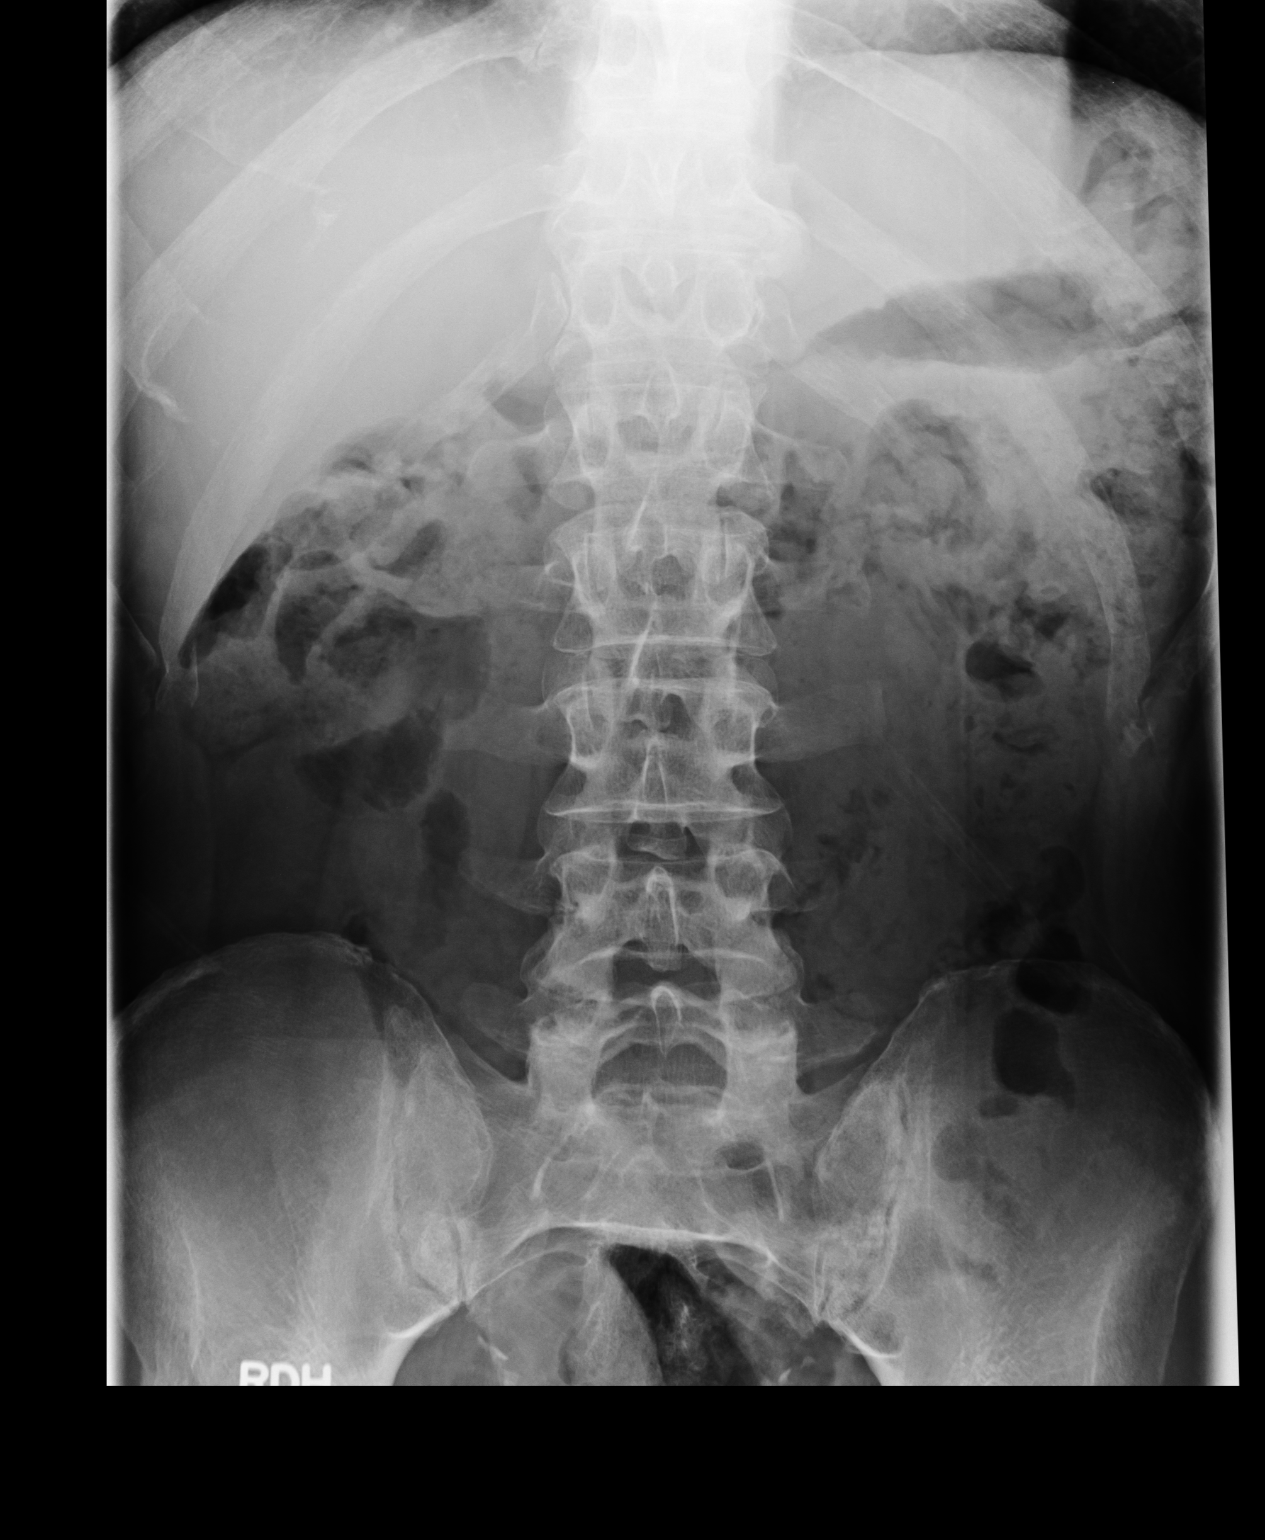
[im 2/3]
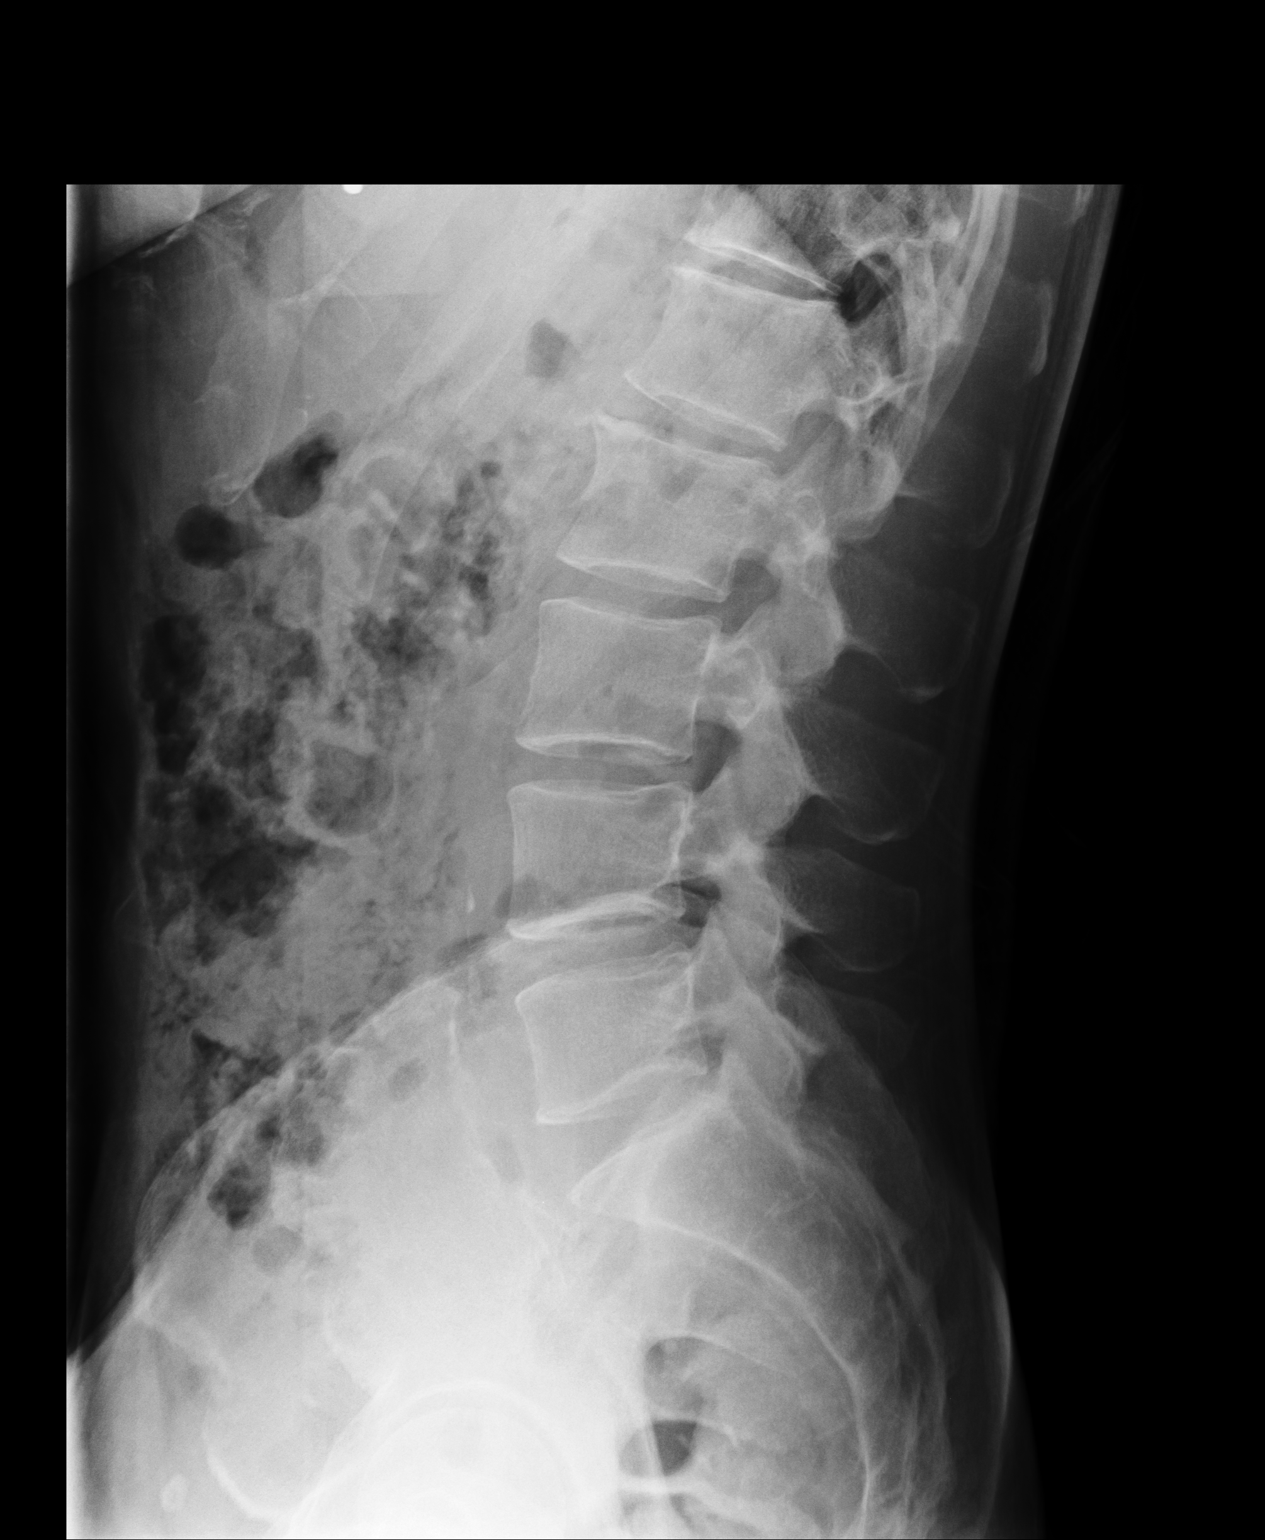
[im 3/3]
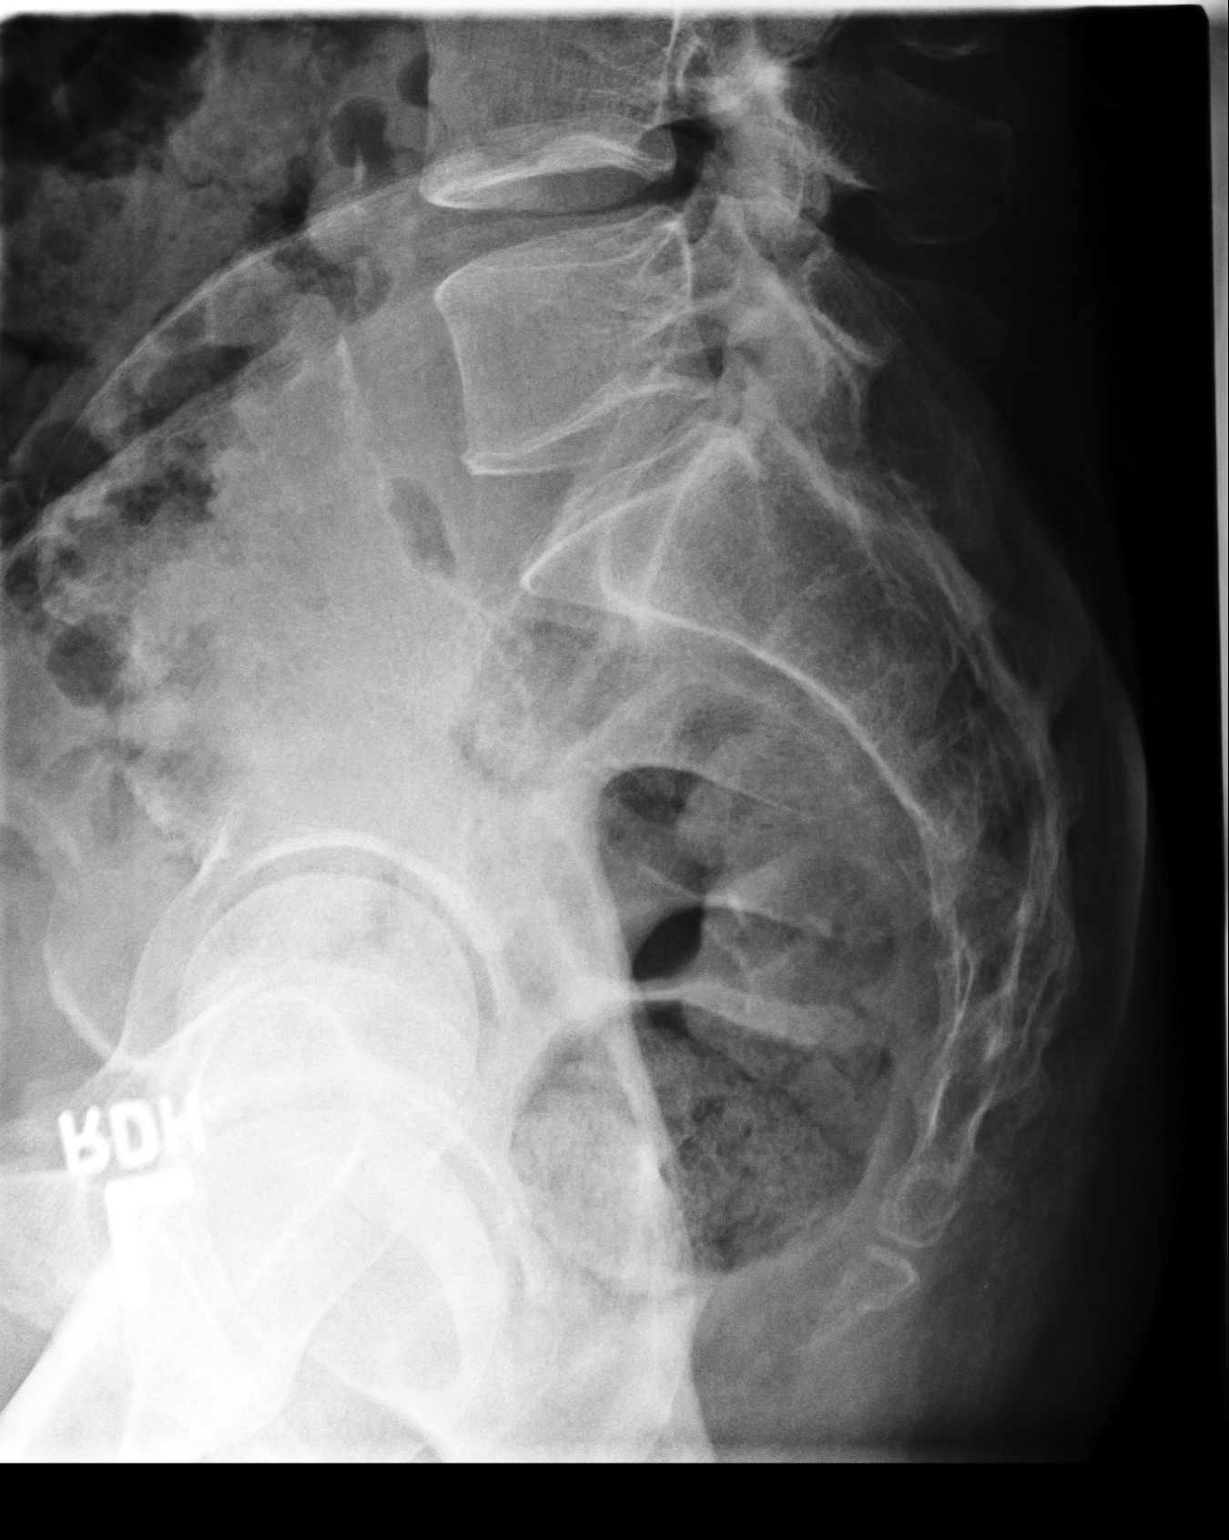

[3 of 3 positions shown; findings below may reference images not displayed]

PROCEDURE:     KDR - KDXR LUMBAR SPINE AP AND LATERAL  - [DATE] [DATE]

RESULT:     Lumbar spine images demonstrate no compression deformity,
subluxation, lytic or sclerotic mass or severe degenerative change. The
alignment is maintained. The disc spaces and vertebral body heights appear
unremarkable. Scattered atherosclerotic calcification is present.
IMPRESSION: 1. No acute bony abnormality evident.

[REDACTED]

## 2012-10-08 ENCOUNTER — Emergency Department: Payer: Self-pay | Admitting: Emergency Medicine

## 2012-10-10 ENCOUNTER — Inpatient Hospital Stay: Payer: Self-pay | Admitting: Internal Medicine

## 2012-10-10 LAB — CBC WITH DIFFERENTIAL/PLATELET
Basophil #: 0.1 10*3/uL (ref 0.0–0.1)
Basophil %: 0.4 %
Eosinophil %: 0.6 %
HGB: 12.8 g/dL — ABNORMAL LOW (ref 13.0–18.0)
Lymphocyte #: 1.4 10*3/uL (ref 1.0–3.6)
Lymphocyte %: 10.9 %
MCH: 30.8 pg (ref 26.0–34.0)
Neutrophil #: 10.1 10*3/uL — ABNORMAL HIGH (ref 1.4–6.5)
Neutrophil %: 78.4 %
Platelet: 251 10*3/uL (ref 150–440)
RBC: 4.15 10*6/uL — ABNORMAL LOW (ref 4.40–5.90)
RDW: 13 % (ref 11.5–14.5)
WBC: 12.8 10*3/uL — ABNORMAL HIGH (ref 3.8–10.6)

## 2012-10-10 LAB — BASIC METABOLIC PANEL
Anion Gap: 4 — ABNORMAL LOW (ref 7–16)
BUN: 21 mg/dL — ABNORMAL HIGH (ref 7–18)
Calcium, Total: 9.3 mg/dL (ref 8.5–10.1)
Chloride: 104 mmol/L (ref 98–107)
Co2: 31 mmol/L (ref 21–32)
EGFR (African American): 49 — ABNORMAL LOW
EGFR (Non-African Amer.): 42 — ABNORMAL LOW
Osmolality: 278 (ref 275–301)
Sodium: 139 mmol/L (ref 136–145)

## 2012-10-10 IMAGING — CT CT NECK WITH CONTRAST
2 series · 10 of 14 positions shown, 12 images · non-contrast
Comparison: none

REASON FOR EXAM: severe throat pain eval for RPA
COMMENTS:

[Series 2: soft tissue · axial · 0.59mm/px · z∈[+142,+394]mm · 8 of 108 slices shown, 10 images]
[im 12/108  soft-tissue]
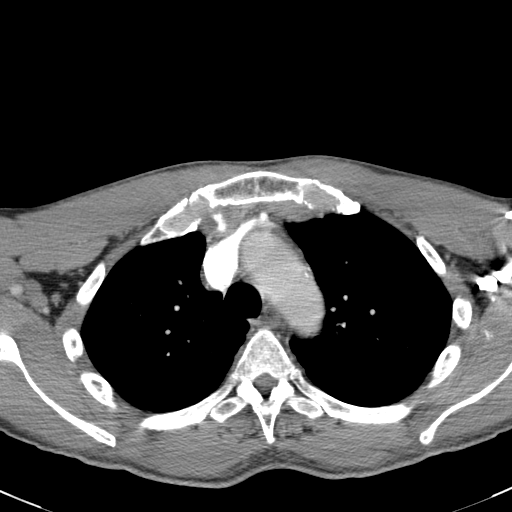
[im 12/108  bone]
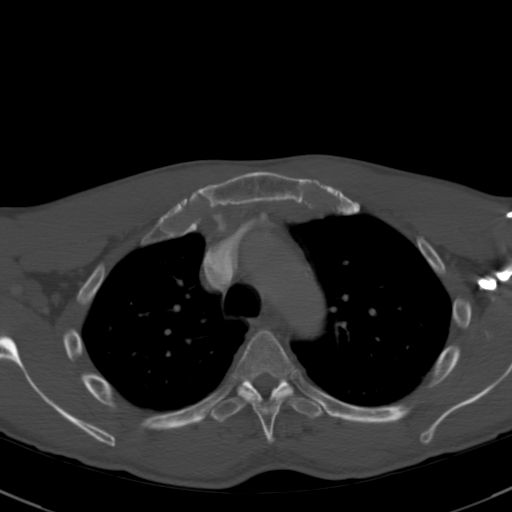
[im 24/108  bone]
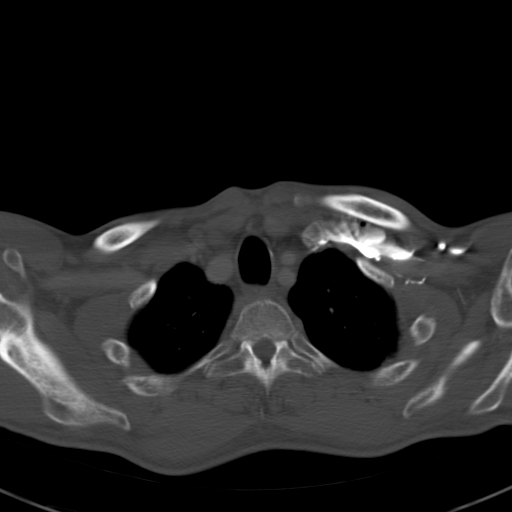
[im 36/108  bone]
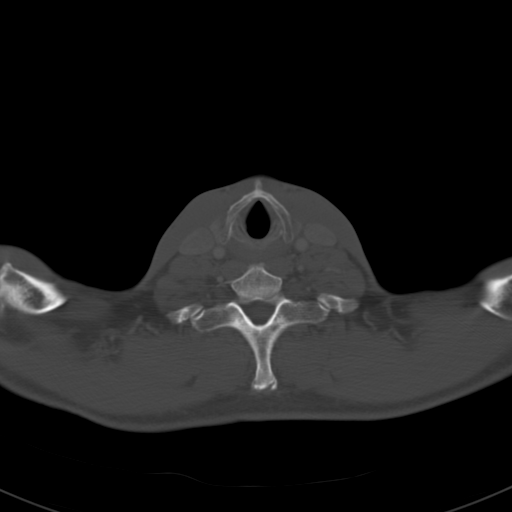
[im 48/108  bone]
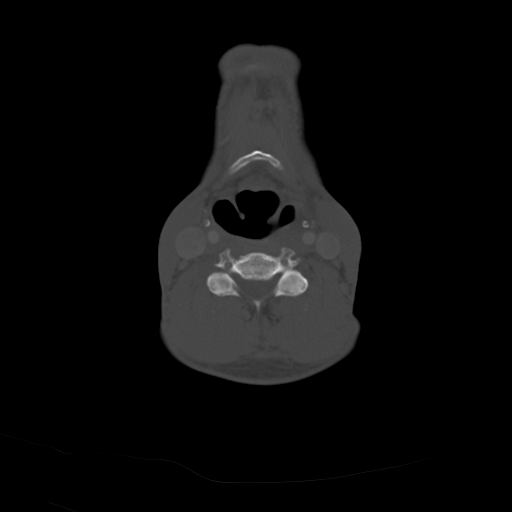
[im 60/108  soft-tissue]
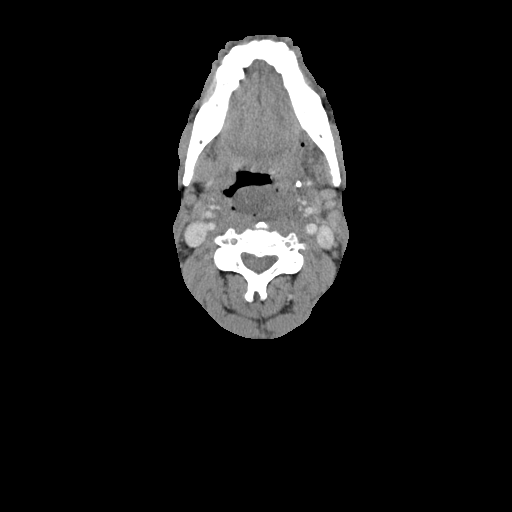
[im 60/108  bone]
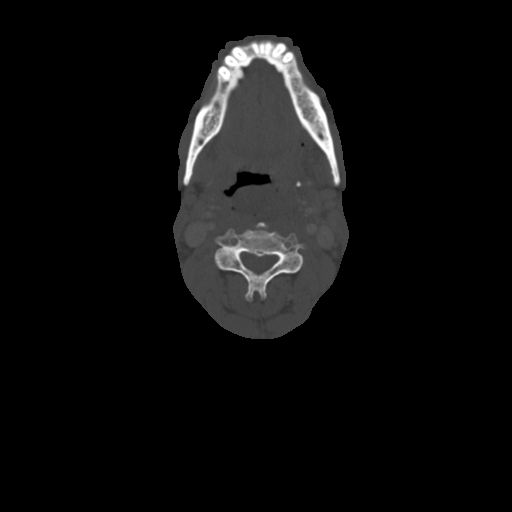
[im 72/108  bone]
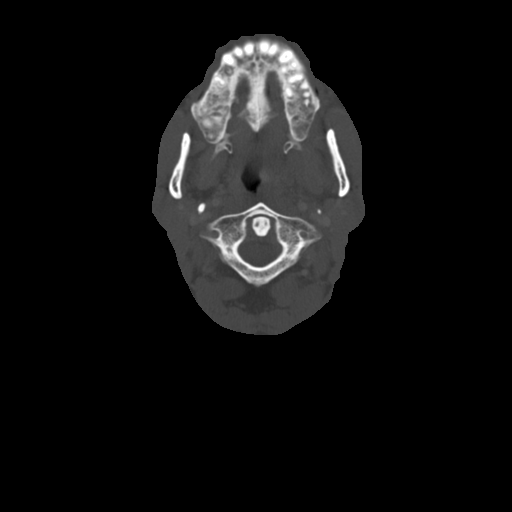
[im 84/108  bone]
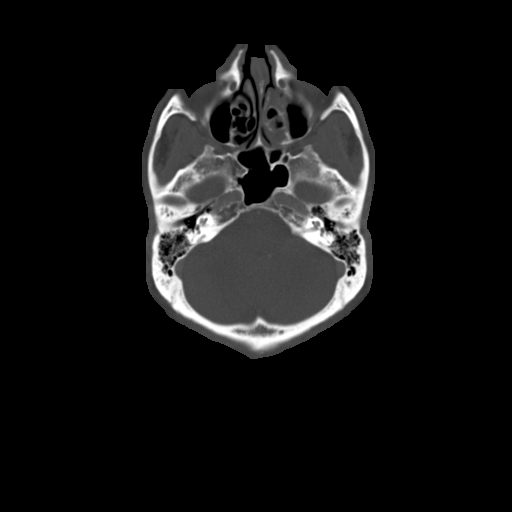
[im 96/108  bone]
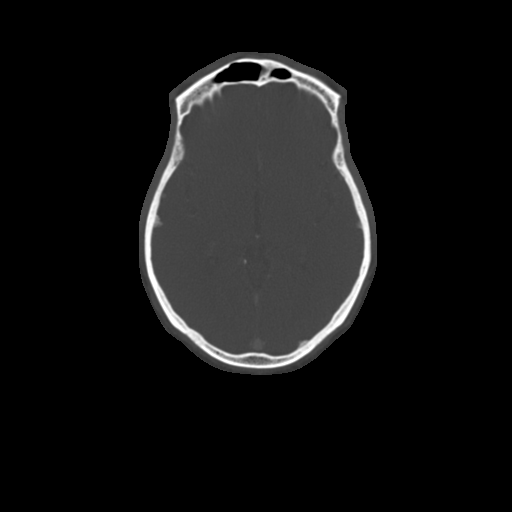

[Series 4: lung windows · axial · 0.66mm/px · z∈[+142,+178]mm · 2 of 36 slices shown]
[im 12/36  bone]
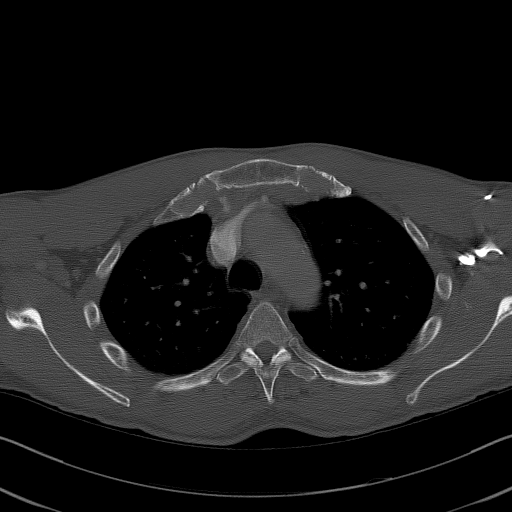
[im 24/36  bone]
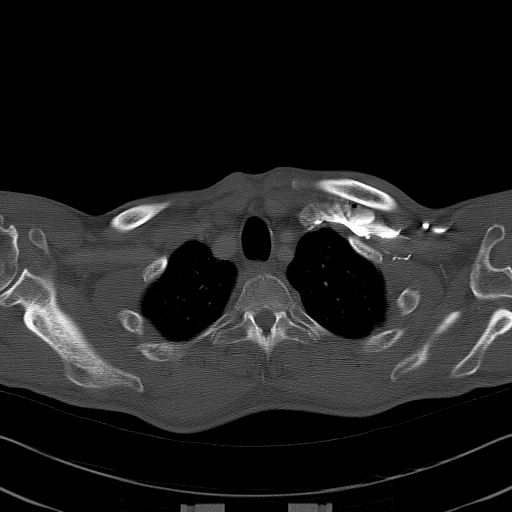

[10 of 14 positions shown; findings below may reference images not displayed]

PROCEDURE:     CT  - CT NECK WITH CONTRAST  - [DATE]  [DATE]

RESULT:     Axial CT scanning was performed through the neck with
reconstructions at 3 mm intervals and slice thicknesses. The patient's
suspected chronic renal insufficiency was discussed with Dr. MARKO ANA . The
patient received a reduced dose of 70 cc of [YF] due to an elevated
serum creatinine of 1.7. The patient was hydrated with 1 liter of fluid
prior to the study and was to receive followup fluid afterwards.

There is soft tissue fullness in the tonsillar regions slightly more
conspicuously on the left. No discrete in low density mass or area of
abnormal enhancement is demonstrated on the left or on the right. There is
narrowing of the oropharyngeal airway by the soft tissue swelling of the
tonsillar regions. There also is apparent edema of the uvula.

I do not see abnormal soft tissue gas collections in the peritonsillar
regions. No pathologic sized anterior or posterior cervical lymph nodes are
demonstrated. The jugular and carotid vessels demonstrate opacification.
There are calcifications at the level of the carotid bulbs bilaterally.

The laryngeal structures and the thyroid lobes appear normal. The pulmonary
apices exhibit no acute abnormalities. The cervical vertebral bodies are
preserved in height. There is some loss of the normal cervical lordosis. The
prevertebral soft tissue spaces do not appear significantly thickened.
IMPRESSION: 1. There is soft tissue swelling of the tonsillar regions bilaterally but
most conspicuously on the left. There is also uvular edema. The findings are
worrisome for severe pharyngitis. A discrete abscess is not demonstrated.
2. There are no air fluid levels in the paranasal sinuses. There is minimal
mucoperiosteal thickening within the left ethmoid and left maxillary sinuses.
3. There is soft tissue fullness in the region of the left piriform sinus
which is nonspecific. This merits further evaluation with direct
visualization.
4. There is no bulky cervical lymphadenopathy. There is atherosclerotic
calcification in the carotid bulbs bilaterally.

The above findings may be purely inflammatory or could reflect both
inflammatory and neoplastic processes. Neoplasm is raised due to the
asymmetric soft tissue density noted in the region of the left piriform
sinus which is entirely separate from the area of presumed inflammation in
the tonsillar region.

ENT evaluation is recommended.

[REDACTED]

## 2012-10-11 LAB — CBC WITH DIFFERENTIAL/PLATELET
Basophil #: 0 10*3/uL (ref 0.0–0.1)
Basophil %: 0.1 %
Eosinophil #: 0 10*3/uL (ref 0.0–0.7)
Eosinophil %: 0 %
HCT: 35.8 % — ABNORMAL LOW (ref 40.0–52.0)
HGB: 11.5 g/dL — ABNORMAL LOW (ref 13.0–18.0)
Lymphocyte #: 0.7 10*3/uL — ABNORMAL LOW (ref 1.0–3.6)
MCH: 29.2 pg (ref 26.0–34.0)
MCHC: 32.1 g/dL (ref 32.0–36.0)
MCV: 91 fL (ref 80–100)
Monocyte #: 0.3 x10 3/mm (ref 0.2–1.0)
Neutrophil #: 11.6 10*3/uL — ABNORMAL HIGH (ref 1.4–6.5)
Neutrophil %: 91.8 %
Platelet: 233 10*3/uL (ref 150–440)
RDW: 13.1 % (ref 11.5–14.5)

## 2012-10-11 LAB — BASIC METABOLIC PANEL
BUN: 17 mg/dL (ref 7–18)
Calcium, Total: 8.7 mg/dL (ref 8.5–10.1)
Chloride: 105 mmol/L (ref 98–107)
Creatinine: 1.36 mg/dL — ABNORMAL HIGH (ref 0.60–1.30)
EGFR (African American): >60
EGFR (Non-African Amer.): 57 — ABNORMAL LOW
Potassium: 4.2 mmol/L (ref 3.5–5.1)
Sodium: 137 mmol/L (ref 136–145)

## 2013-05-16 ENCOUNTER — Emergency Department: Payer: Self-pay | Admitting: Emergency Medicine

## 2013-08-27 ENCOUNTER — Emergency Department: Payer: Self-pay | Admitting: Emergency Medicine
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
## 2014-07-20 NOTE — Discharge Summary (Signed)
PATIENT NAME:  Nicholas Weiss, Nicholas Weiss MR#:  [id number] DATE OF BIRTH:  1950-08-16  DATE OF ADMISSION:  10/10/2012 DATE OF DISCHARGE:  10/11/2012  ADMITTING DIAGNOSIS:  Acute pharyngitis.   DISCHARGE DIAGNOSES:   1.  Severe pharyngitis with dysphagia, history of pharyngitis as well as dehydration and acute renal failure, resolving on IV fluids. 2.  History of hypertension.  3.  Hyperlipidemia.  4.  Diabetes mellitus, insulin dependent.   DISCHARGE CONDITION:  Stable.   DISCHARGE MEDICATIONS:  The patient is to continue: 1.  Azithromycin 250 mg by mouth 2 tablets once daily on the first day and then 1 tablet once daily for 4 days.  2.  Lovastatin 20 mg by mouth daily at bedtime.  3.  Tramadol 50 mg by mouth every 4 to 6 hours as needed.  4.  NovoLog mix 70/30 6 units subcutaneously once daily.  5.  Prednisone taper 60 mg by mouth once on 10/12/2012, then taper by 10 mg daily until stopped.  6.  Amoxicillin clavulanate 875/125 1 tablet twice daily for 10 days.  7.  Benzocaine/menthol topical lozenges one lozenge every two hours as needed.  70  The patient is not to take HCTZ unless recommended by primary care physician.   DIET:  2 gram salt, low-fat, low-cholesterol, carbohydrate-controlled diet.  Pureed consistency.  The patient was advised to advance diet as tolerated over the next few days and drink plenty of fluids.   ACTIVITY LIMITATIONS:  As tolerated.   FOLLOW-UP APPOINTMENT:  With Dr. Caryn Section in two days after discharge.  The patient was also advised to have kidney function checked in the next few days after discharge.   CONSULTANTS:  Care management.   RADIOLOGIC STUDIES:  CT scan of neck with contrast, 10/10/2012, showed soft tissue swelling of the tonsillar region bilaterally, but most conspicuity on the left.  There is also uvula edema.  The findings are worrisome for severe pharyngitis.  A discrete abscesses is not demonstrated.  There are no ear fluid levels in the paranasal  sinuses.  There is minimal mucoperiosteal thickening within the left ethmoid and left maxillary sinuses.  There is a soft tissue fullness in the region of the left pyriform sinus which is nonspecific.  This merits further evaluation with direct visualization.  There is no bulky cervical lymphadenopathy.  There is atherosclerotic calcification in the carotid bulbs bilaterally.  The above findings may be purely inflammatory or could be reflecting both inflammatory and neoplastic processes.  Neoplasm is raised due to the asymmetric soft tissue density noted in the region of left pyriform sinus which is entirely separate from the area of presumed inflammation in the frontal region.  ENT evaluation was recommended.   HOSPITAL COURSE:  The patient is a 64 year old male with past medical history significant for history of diabetes, hypertension as well as hyperlipidemia who presented to the hospital with sore throat.  Please refer to Dr. Ward Givens admission on 10/10/2012.  On arrival to the Emergency Room, the patient's temperature was 99, pulse was 92, respiratory rate was 18, blood pressure 145/95, saturation was 98% on room air.  Physical exam revealed some swelling of the throat area, sublingual area as well as submandibular lymphadenopathy, otherwise unremarkable.  However, visualization was somewhat impaired because of pain and swelling.    The patient's lab data done on admission showed elevation of BUN and creatinine to 21 and 1.73, glucose only 61, otherwise BMP was unremarkable.  The patient's CBC showed white blood cell count 12.8,  hemoglobin 7.8 and platelet count 251.  Absolute neutrophil count was elevated to 10.1.  The patient had beta strep checked on throat exam and showed that no beta streptococcus was (Dictation Anomaly) isolated in 8 to 12 hours.  The patient was admitted to the hospital for further evaluation.  He was started on a steroid taper as well as antibiotic therapy IV with Rocephin.   With this therapy he improved significantly.  He was able to tolerate clear liquid diet and diet was advanced to full liquid.  He was also advised to continue advancing diet depending on his tolerance.  We consulted ENT physician, however ENT physician was not available so we recommend the patient to be followed up by ENT physician as outpatient.  Meanwhile he is to continue antibiotics as well as steroid taper.  Double antibiotics were given for him including anaerobic bacteria antibiotic which would cover anaerobic bacteria.  In regards to his other medical problems such as diabetes and hyperlipidemia as well as hypertension, the patient is to continue his outpatient management.  In regards to acute renal failure, the patient was given IV fluids and his kidney function improved.  On 10/11/2012, the patient's creatinine is 1.36 with normal BUN of 17.  The patient was drinking plenty of fluids and was felt that he is going to recover perfectly fine, however would recommend to check his creatinine levels as outpatient to make sure of its stabilization and possibly normalization, normal levels.  If however the patient fails to have creatinine normalize, I would recommend nephrology consultation in this diabetic patient.  In regards to smoking, we discussed with patient smoking risks and recommended to stop.  The patient is being discharged in stable condition with above-mentioned medications and follow-up.    His vital signs on the day of discharge:  Temperature was 99.1, pulse was ranging from 86 to 93, respiration rate was 17 to 18, blood pressure rate was around 145/69 and saturations were 98% to 99% on room air at rest.   TIME SPENT:  40 minutes.    ____________________________ Theodoro Grist, MD rv:ea D: 10/11/2012 21:47:24 ET T: 10/12/2012 00:30:50 ET JOB#: [id number]  cc: Theodoro Grist, MD, <Dictator> Mazikeen Hehn MD ELECTRONICALLY SIGNED 10/20/2012 11:48

## 2014-07-20 NOTE — H&P (Signed)
PATIENT NAME:  Nicholas, Weiss MR#:  [id number] DATE OF BIRTH:  1951/03/12  DATE OF ADMISSION:  10/10/2012  PRIMARY CARE PHYSICIAN: Lelon Huh.    REFERRING PHYSICIAN: Marsa Aris.   CHIEF COMPLAINT: Sore throat.   HISTORY OF PRESENT ILLNESS: Nicholas Weiss is a 64 year old African American male with a past medical history of hypertension, diabetes mellitus. Started to experience symptoms of sore throat for the last 4 to 5 days. The symptoms have significantly worsened to the point that unable to swallow or speak. Concerning this, came to the Emergency Department. In the Emergency Department, the patient was somewhat drooling. The patient received Unasyn and Decadron with mild improvement, as well as pain medication. The patient experienced some subjective fever. Had some mild pain in the ears. Denies having any previous episodes. Denies having any sick contacts. CT neck done in the Emergency Department showed soft tissue swelling of the tonsillar regions bilaterally, mostly on the left, with uvula edema, worrisome for severe pharyngitis. No discrete abscess was identified. Also, there is soft tissue fullness in the region of the left piriform sinus. Concerning this, as the patient is unable to swallow, the decision is made to admit the patient for further treatment.   PAST MEDICAL HISTORY:  1. Hypertension.  2. Diabetes mellitus.  3. Right knee surgery.   ALLERGIES: No known drug allergies.   HOME MEDICATIONS:  1. Tramadol 50 mg every 4 to 6 hours as needed.  2. NovoLog 70/30 six units subcutaneous once a day.  3. Lovastatin 20 mg once a day.  4. Hydrochlorothiazide 25 mg once a day.  5. Azithromycin 250 mg once a day.   SOCIAL HISTORY: Smokes 1 pack a day. Drinks alcohol occasionally. Denies using any illicit drugs. Works as a Sports coach.   FAMILY HISTORY: Father had an MI. Mother is still living.   REVIEW OF SYSTEMS:  CONSTITUTIONAL: Generalized weakness.  EYES: No change in vision.   EARS, NOSE, THROAT: Has a sore throat, pain in the ears.  RESPIRATORY: Has cough. No shortness of breath.  CARDIOVASCULAR: No chest pain, palpitations. No pedal edema.  GASTROINTESTINAL: Has difficulty swallowing. No abdominal pain, nausea or vomiting.  GENITOURINARY: No dysuria or hematuria.  SKIN: No rash or lesions.  MUSCULOSKELETAL: No joint pains and aches.  NEUROLOGIC: No weakness or numbness in any part of the body.  HEMATOLOGY: No easy bruising or bleeding.  PSYCHIATRIC: No depression.   PHYSICAL EXAMINATION:  GENERAL: This is a well-built, well-nourished, age-appropriate male lying down in the bed, unable to speak sentences secondary to pain in the throat as well as swelling.  VITAL SIGNS: Temperature 99, pulse 92, blood pressure 145/95, respiratory rate of 18, oxygen saturation is 98% on room air.  HEENT: Head normocephalic, atraumatic. There is no scleral icterus. Conjunctivae normal. Pupils equal and react to light. Extraocular movements are intact. Mucous membranes moist. I could not see the oropharynx. Significant swelling of the uvula. Has submandibular lymphadenopathy.  HEART: S1, S2 regular. No murmurs are heard.  ABDOMEN: Bowel sounds present. Soft, nontender, nondistended. No hepatosplenomegaly.  EXTREMITIES: No pedal edema. Pulses 2+.  MUSCULOSKELETAL: Good range of motion in all of the extremities.  SKIN: No rash or lesions.  NEUROLOGIC: Alert, oriented to place, person and time. Cranial nerves II through XII intact. Motor 5/5 in upper and lower extremities.   LABS: CBC: WBC of 12.8, hemoglobin 12.8, platelet count of 251. CMP: BUN 21, creatinine of 1.73. CT neck shows soft tissue swelling of the tonsillar region  bilaterally,  on the left. Uvular edema. Worrisome for severe pharyngitis. No discrete abscess. No air-fluid levels in the paranasal sinuses. Soft tissue fullness in the region of the left piriform sinus. No bulky cervical lymphadenopathy. Atherosclerotic  calcifications in the carotid  bilaterally.   ASSESSMENT AND PLAN: [name] is a 64 year old male who comes to the Emergency Department with significant swelling of the pharynx.  1. Acute pharyngitis: The patient does not have any fever. Has a mild elevation of the WBC count. The patient received already Unasyn. Will continue with the Rocephin and Zithromax. Continue with the Decadron. If the patient does not have much improvement in the next few days, also consider doing excisional biopsy. Will obtain strep throat and cultures.  2. Diabetes mellitus: Continue with home medications. Especially starting the patient on Decadron, watch closely and cover with sliding scale insulin. Based on the requirement, will increase the dose of the insulin.  3. Hypertension: Currently well controlled. Continue the home medications. Will keep the patient on hydralazine as needed.  4. Mild renal insufficiency: Continue with intravenous fluids and follow up.  5. Keep the patient on deep vein thrombosis prophylaxis with Lovenox.   TIME SPENT: 45 minutes.    ____________________________ Nicholas Becton, MD pv:gb D: 10/11/2012 00:02:39 ET T: 10/11/2012 02:09:25 ET JOB#: [id number]  cc: Nicholas Becton, MD, <Dictator> Nicholas Weiss. Nicholas Section, MD Nicholas Becton MD ELECTRONICALLY SIGNED 10/12/2012 0:26

## 2014-10-09 IMAGING — US US ABDOMEN LIMITED
1 series · 14 of 25 positions shown · non-contrast
Comparison: Reports from CT of the abdomen and pelvis performed
[DATE], and abdominal ultrasound performed [DATE]

CLINICAL DATA: Acute onset of right upper quadrant abdominal pain
for 1 day. Initial encounter.

EXAM:
US ABDOMEN LIMITED - RIGHT UPPER QUADRANT

[Series 1: us abdomen limited · 0.18mm/px · 14 of 47 slices shown]
[im 1/47]
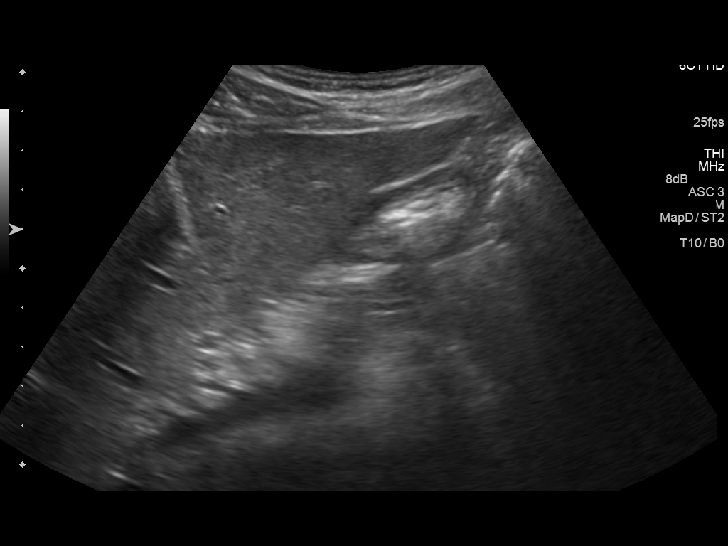
[im 4/47]
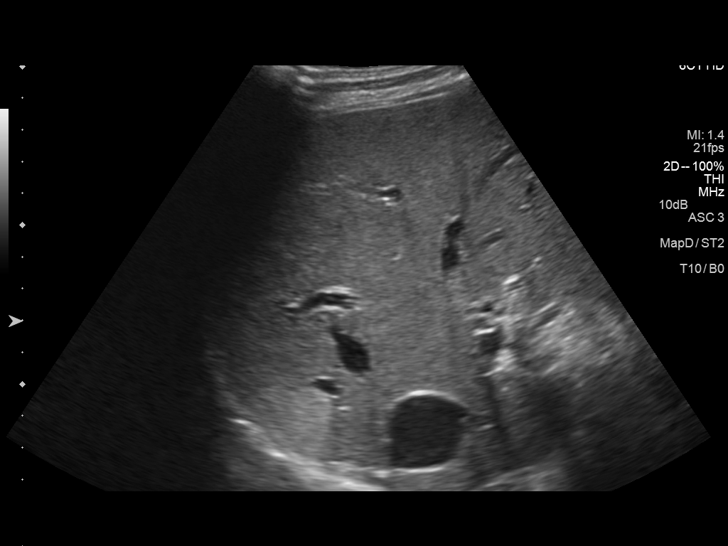
[im 8/47]
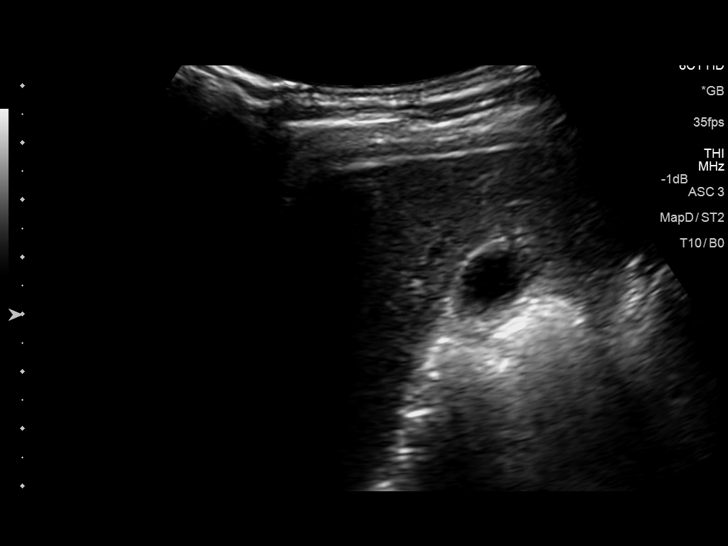
[im 12/47]
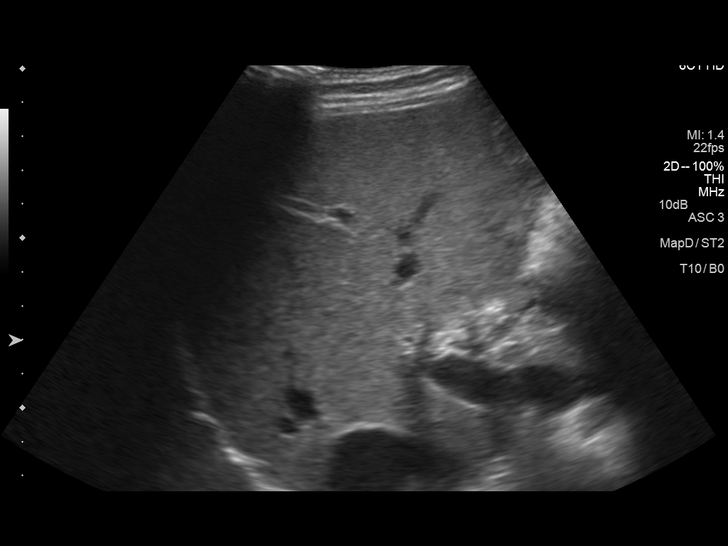
[im 16/47]
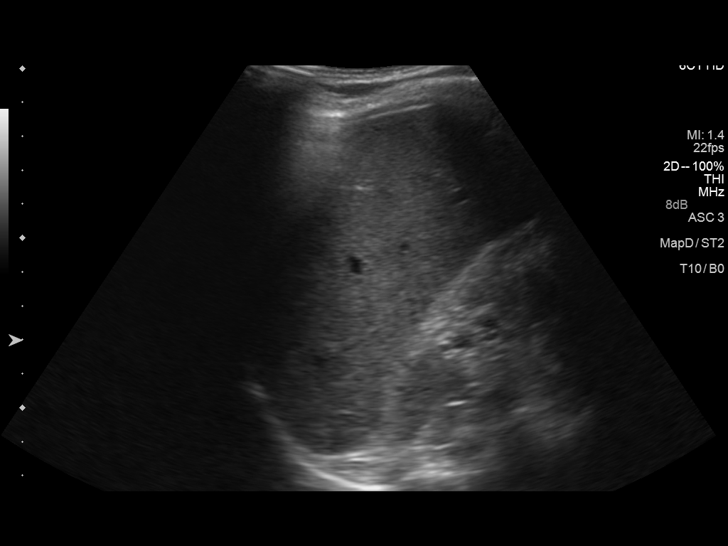
[im 18/47]
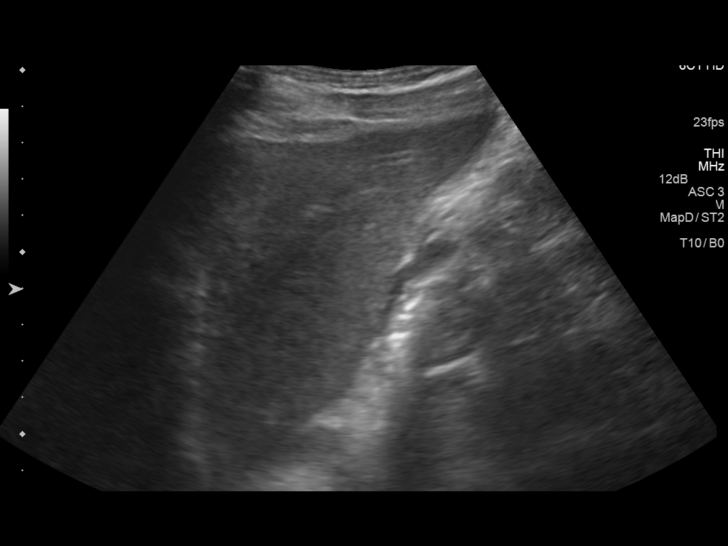
[im 22/47]
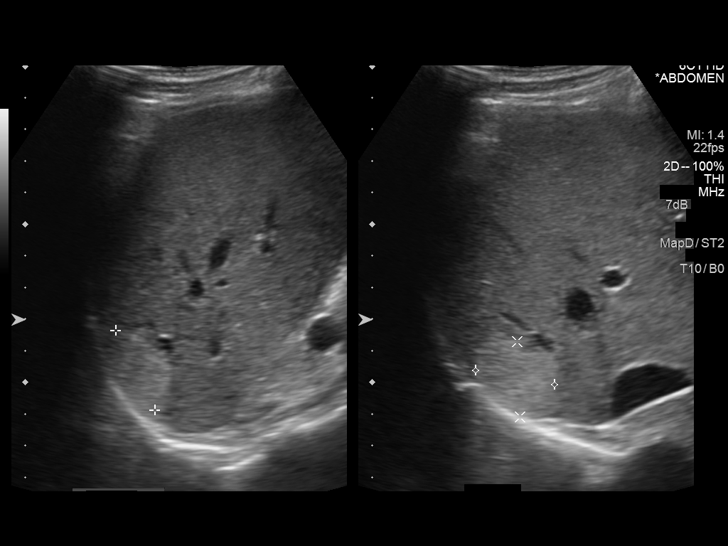
[im 25/47]
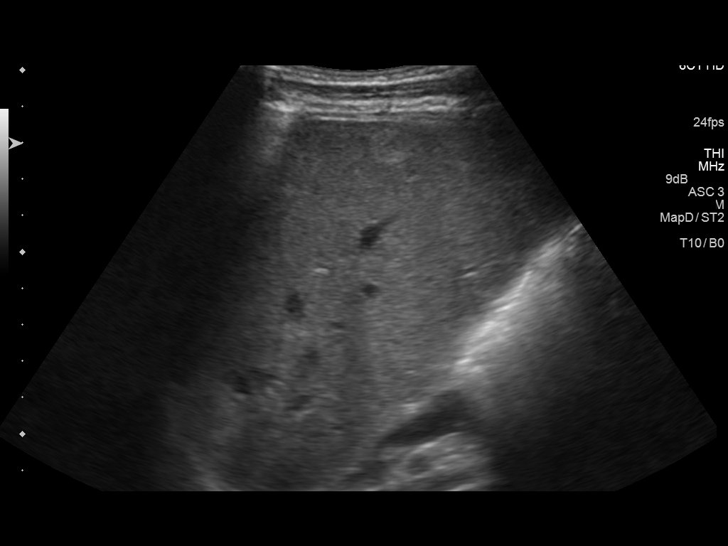
[im 29/47]
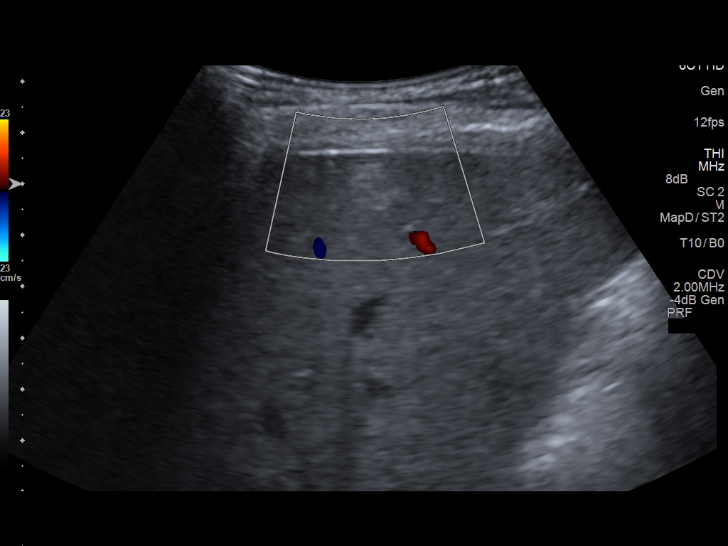
[im 31/47]
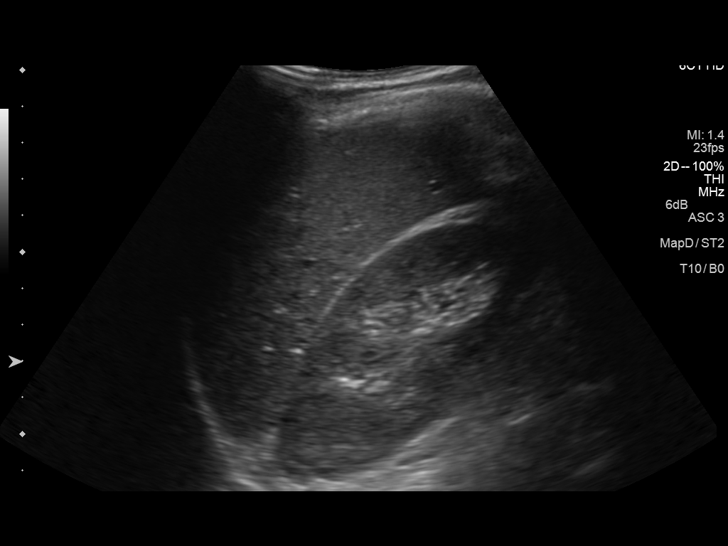
[im 35/47]
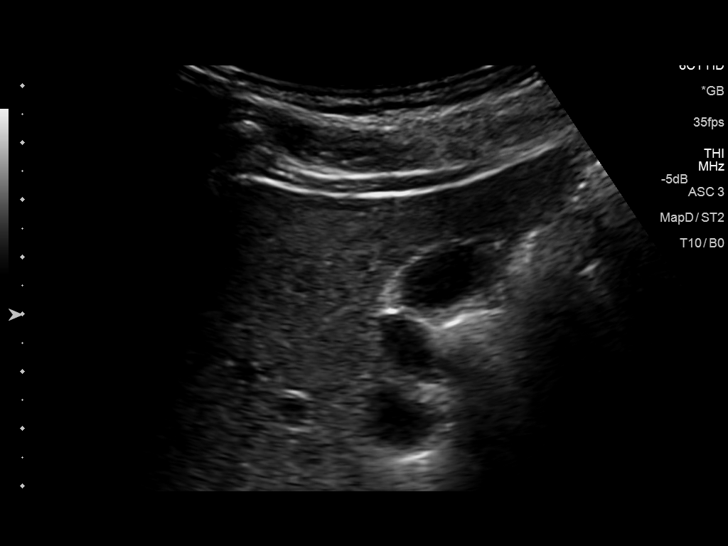
[im 39/47]
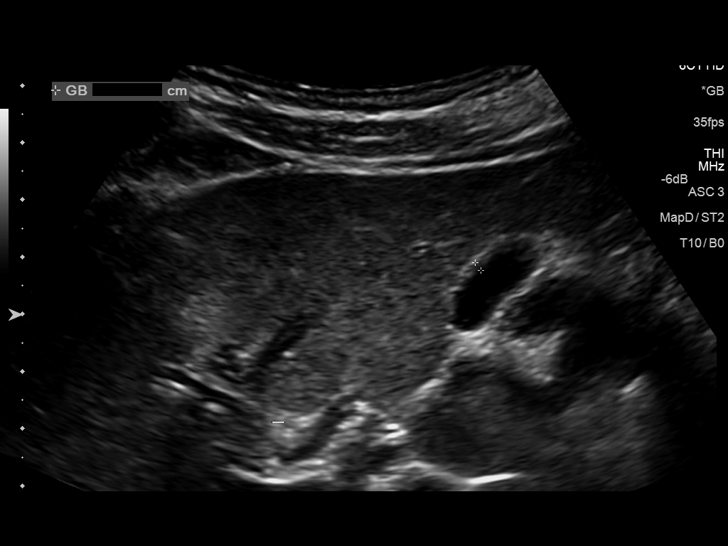
[im 43/47]
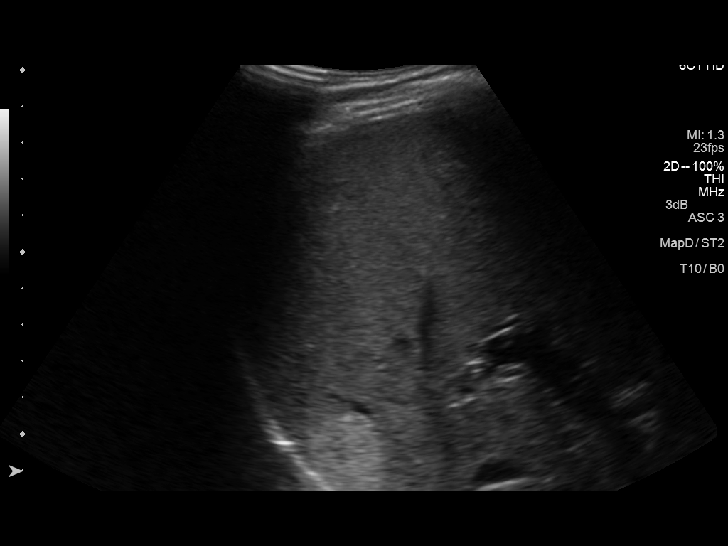
[im 47/47]
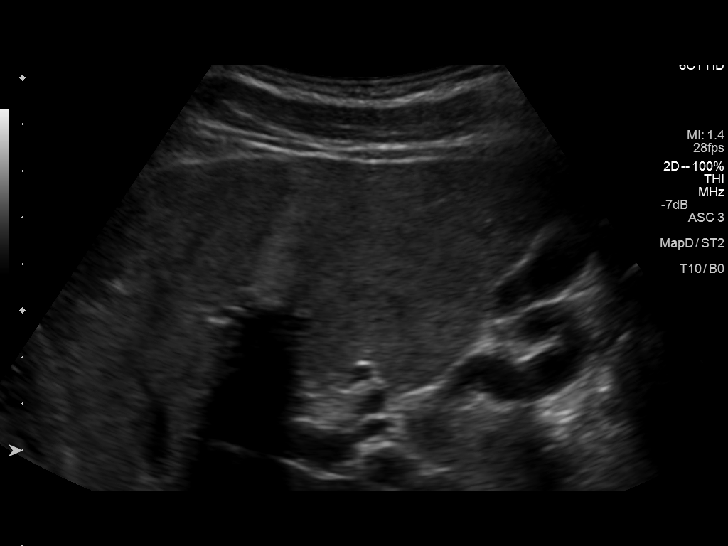

[14 of 25 positions shown; findings below may reference images not displayed]

FINDINGS: Gallbladder:

No gallstones or wall thickening visualized. No sonographic Murphy
sign noted.

Common bile duct:

Diameter: 0.3 cm, within normal limits in caliber.

Liver:

Vague regions of increased echogenicity are noted within the right
hepatic lobe, measuring 2.8 cm and 1.1 cm in size. These were first
visualized in [LG] and likely reflect hemangiomas. Within normal
limits in parenchymal echogenicity.
IMPRESSION: 1. No acute abnormality seen in the right upper quadrant.
2. Hemangiomas again noted within the liver. Liver otherwise
unremarkable.

## 2014-10-16 ENCOUNTER — Encounter: Payer: Self-pay | Admitting: Emergency Medicine

## 2014-10-16 DIAGNOSIS — K5900 Constipation, unspecified: Secondary | ICD-10-CM | POA: Insufficient documentation

## 2014-10-16 DIAGNOSIS — E119 Type 2 diabetes mellitus without complications: Secondary | ICD-10-CM | POA: Insufficient documentation

## 2014-10-16 DIAGNOSIS — N200 Calculus of kidney: Secondary | ICD-10-CM | POA: Insufficient documentation

## 2014-10-16 DIAGNOSIS — R1110 Vomiting, unspecified: Secondary | ICD-10-CM | POA: Insufficient documentation

## 2014-10-16 DIAGNOSIS — Z880 Allergy status to penicillin: Secondary | ICD-10-CM | POA: Insufficient documentation

## 2014-10-16 LAB — URINALYSIS COMPLETE WITH MICROSCOPIC (ARMC ONLY)
BACTERIA UA: NONE SEEN
Bilirubin Urine: NEGATIVE
Glucose, UA: >500 mg/dL — AB
Leukocytes, UA: NEGATIVE
Nitrite: NEGATIVE
Protein, ur: NEGATIVE mg/dL
RBC / HPF: NONE SEEN RBC/hpf (ref 0–5)
Specific Gravity, Urine: 1.026 (ref 1.005–1.030)
pH: 5 (ref 5.0–8.0)

## 2014-10-16 LAB — COMPREHENSIVE METABOLIC PANEL
ALK PHOS: 67 U/L (ref 38–126)
ALT: 19 U/L (ref 17–63)
AST: 16 U/L (ref 15–41)
Albumin: 4.4 g/dL (ref 3.5–5.0)
Anion gap: 11 (ref 5–15)
BUN: 29 mg/dL — AB (ref 6–20)
CALCIUM: 9.3 mg/dL (ref 8.9–10.3)
CO2: 27 mmol/L (ref 22–32)
Chloride: 97 mmol/L — ABNORMAL LOW (ref 101–111)
Creatinine, Ser: 1.93 mg/dL — ABNORMAL HIGH (ref 0.61–1.24)
GFR calc Af Amer: 41 mL/min — ABNORMAL LOW (ref >60)
GFR calc non Af Amer: 36 mL/min — ABNORMAL LOW (ref >60)
GLUCOSE: 410 mg/dL — AB (ref 65–99)
Potassium: 4.2 mmol/L (ref 3.5–5.1)
Sodium: 135 mmol/L (ref 135–145)
Total Bilirubin: 0.8 mg/dL (ref 0.3–1.2)
Total Protein: 8.7 g/dL — ABNORMAL HIGH (ref 6.5–8.1)

## 2014-10-16 LAB — CBC WITH DIFFERENTIAL/PLATELET
Basophils Absolute: 0 10*3/uL (ref 0–0.1)
Basophils Relative: 1 %
EOS PCT: 1 %
Eosinophils Absolute: 0 10*3/uL (ref 0–0.7)
HCT: 40.7 % (ref 40.0–52.0)
Hemoglobin: 13.2 g/dL (ref 13.0–18.0)
LYMPHS ABS: 0.9 10*3/uL — AB (ref 1.0–3.6)
LYMPHS PCT: 18 %
MCH: 29.7 pg (ref 26.0–34.0)
MCHC: 32.5 g/dL (ref 32.0–36.0)
MCV: 91.3 fL (ref 80.0–100.0)
MONOS PCT: 9 %
Monocytes Absolute: 0.5 10*3/uL (ref 0.2–1.0)
NEUTROS PCT: 71 %
Neutro Abs: 3.4 10*3/uL (ref 1.4–6.5)
Platelets: 261 10*3/uL (ref 150–440)
RBC: 4.46 MIL/uL (ref 4.40–5.90)
RDW: 13.1 % (ref 11.5–14.5)
WBC: 4.8 10*3/uL (ref 3.8–10.6)

## 2014-10-16 LAB — LIPASE, BLOOD: Lipase: 25 U/L (ref 22–51)

## 2014-10-16 NOTE — ED Notes (Signed)
Pt presents to ED with vomiting for the past 2 evenings and epigastric abd pain. Pt reports he is also a diabetic and his sugar was elevated tonight. Pain described as burning. Denies similar symptoms in the past. Last time vomiting was approx 1 hour ago. Denies diarrhea.

## 2014-10-17 ENCOUNTER — Emergency Department: Payer: Self-pay

## 2014-10-17 ENCOUNTER — Emergency Department
Admission: EM | Admit: 2014-10-17 | Discharge: 2014-10-17 | Disposition: A | Discharge status: Home / Self Care | Payer: Self-pay | Attending: Emergency Medicine | Admitting: Emergency Medicine

## 2014-10-17 DIAGNOSIS — R1011 Right upper quadrant pain: Secondary | ICD-10-CM

## 2014-10-17 DIAGNOSIS — K5900 Constipation, unspecified: Secondary | ICD-10-CM

## 2014-10-17 DIAGNOSIS — N200 Calculus of kidney: Secondary | ICD-10-CM

## 2014-10-17 HISTORY — DX: Type 2 diabetes mellitus without complications: E11.9

## 2014-10-17 LAB — GLUCOSE, CAPILLARY
GLUCOSE-CAPILLARY: 338 mg/dL — AB (ref 65–99)
Glucose-Capillary: 335 mg/dL — ABNORMAL HIGH (ref 65–99)

## 2014-10-17 IMAGING — CT CT ABD-PELV W/ CM
2 of 4 series · 14 of 42 positions shown, 18 images · IV contrast (omnipaque)
Comparison: Prior ultrasound from earlier the same day.

CLINICAL DATA: Initial evaluation for acute right upper
quadrant/epigastric pain, vomiting.

EXAM:
CT ABDOMEN AND PELVIS WITH CONTRAST
TECHNIQUE: Multidetector CT imaging of the abdomen and pelvis was performed
using the standard protocol following bolus administration of
intravenous contrast.
CONTRAST:  80mL OMNIPAQUE IOHEXOL 300 MG/ML  SOLN

[Series 2: routine abd pel with · axial · 0.67mm/px · z∈[-526,-116]mm · 11 of 94 slices shown, 15 images]
[im 6/94  soft-tissue]
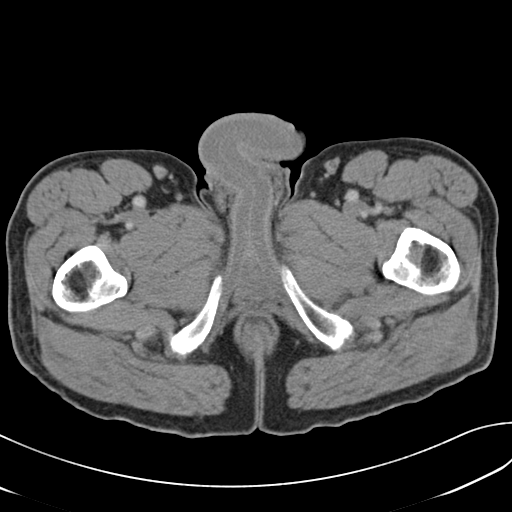
[im 6/94  bone]
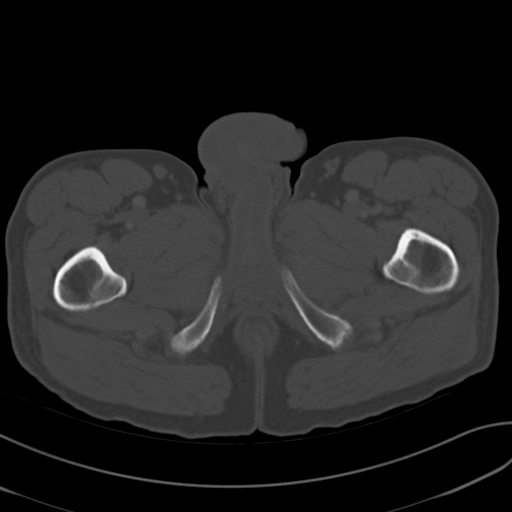
[im 16/94  soft-tissue]
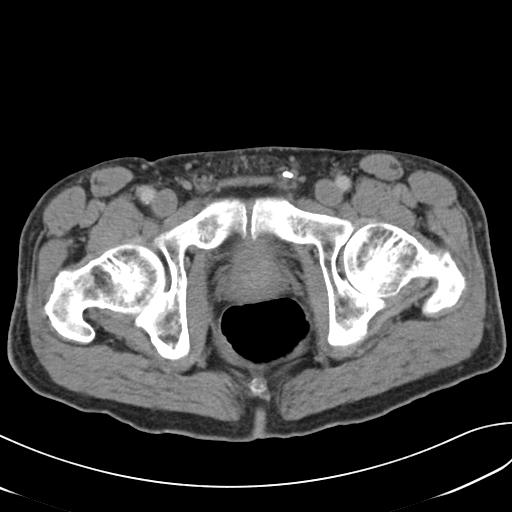
[im 26/94  soft-tissue]
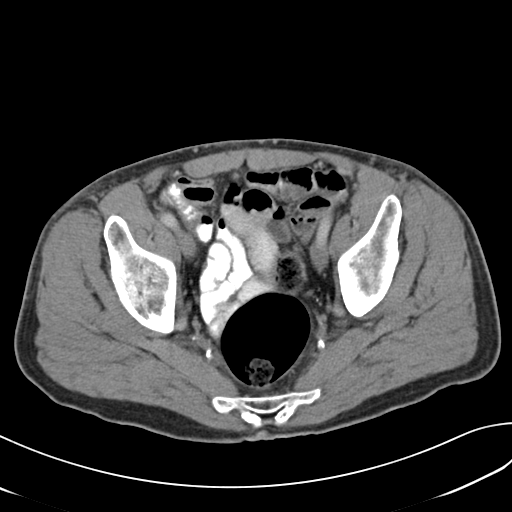
[im 37/94  soft-tissue]
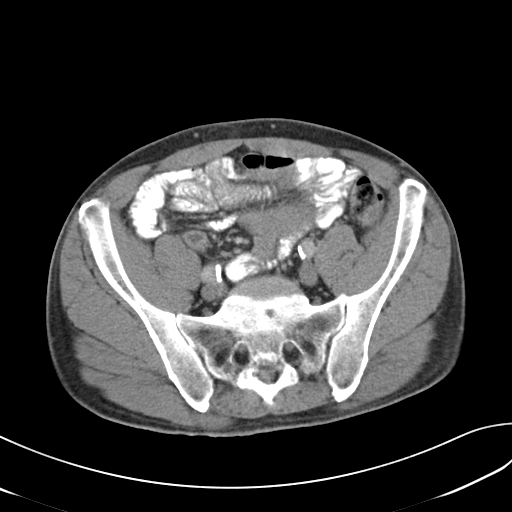
[im 47/94  soft-tissue]
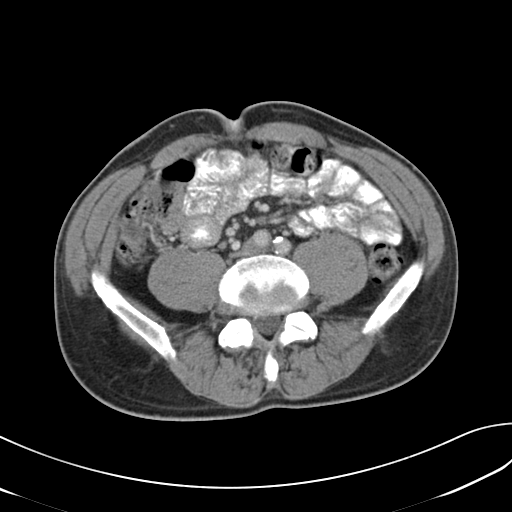
[im 57/94  soft-tissue]
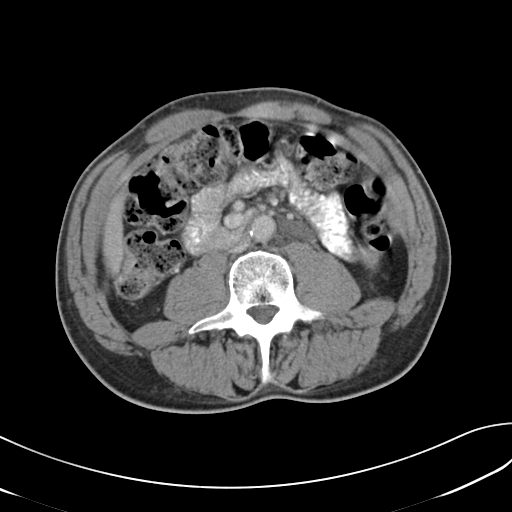
[im 68/94  soft-tissue]
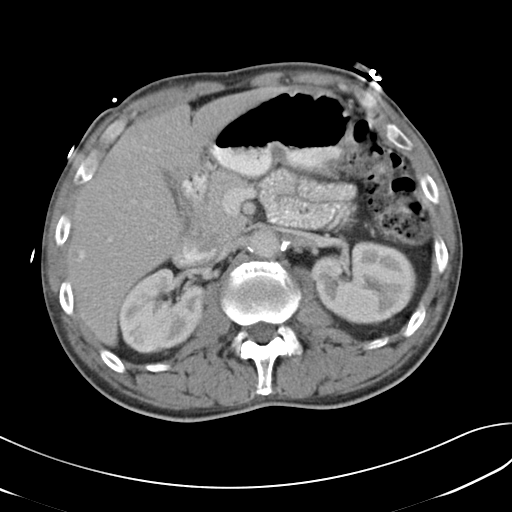
[im 73/94  lung]
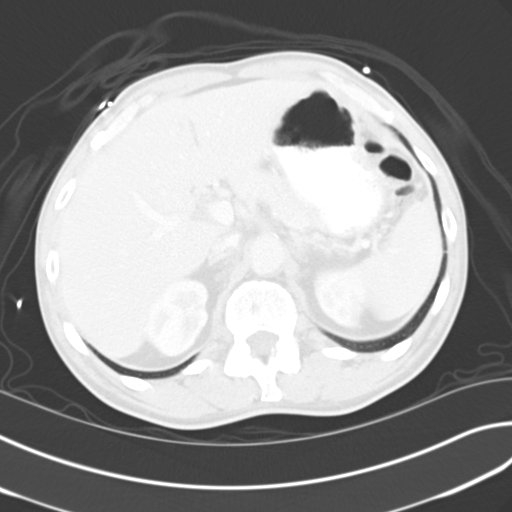
[im 78/94  soft-tissue]
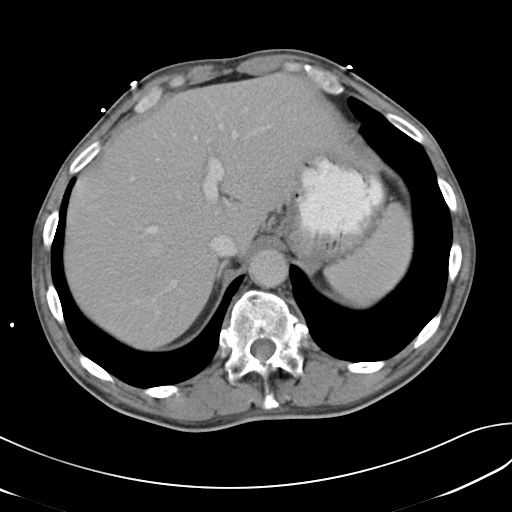
[im 78/94  lung]
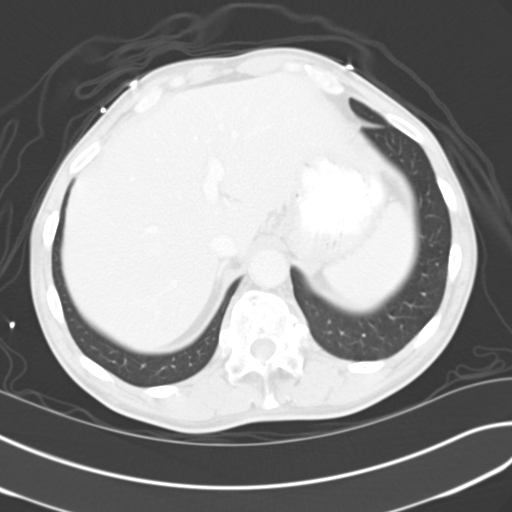
[im 83/94  lung]
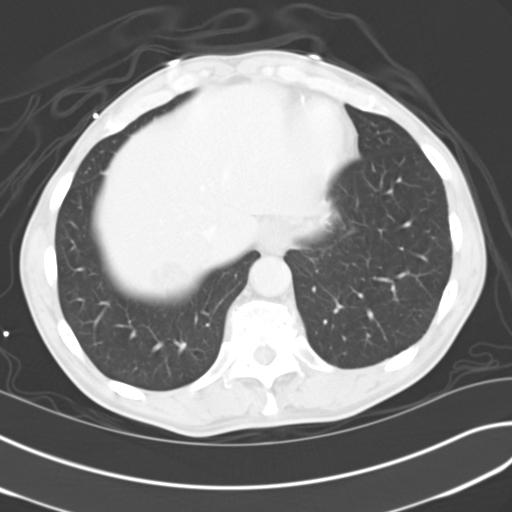
[im 88/94  soft-tissue]
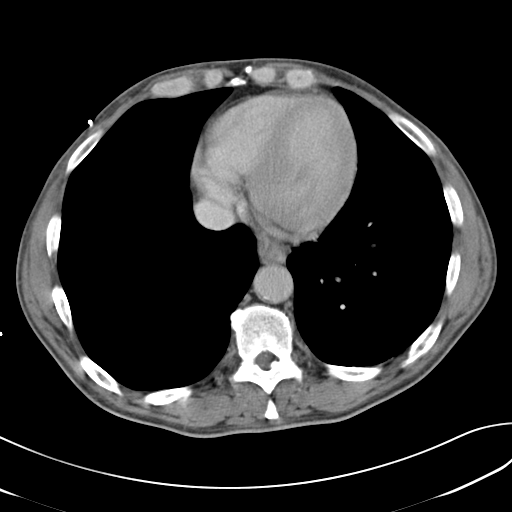
[im 88/94  lung]
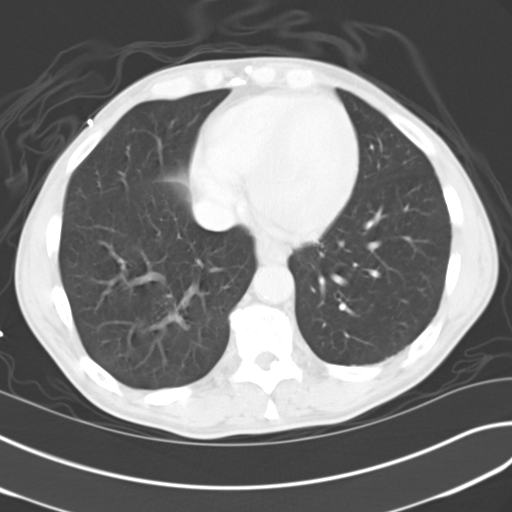
[im 88/94  bone]
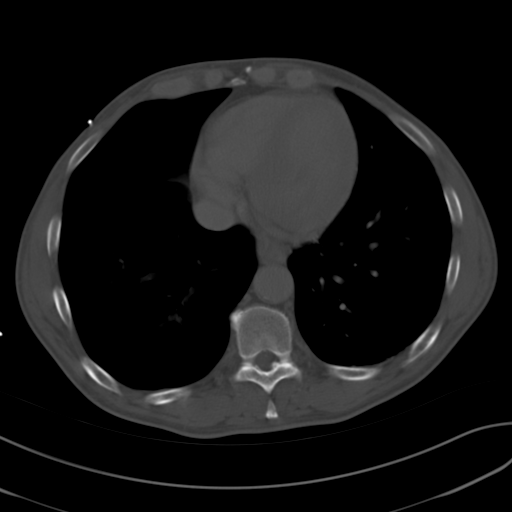

[Series 6: cor routine abd pel with · coronal · 0.97mm/px · 3 of 130 slices shown]
[im 44/130  soft-tissue]
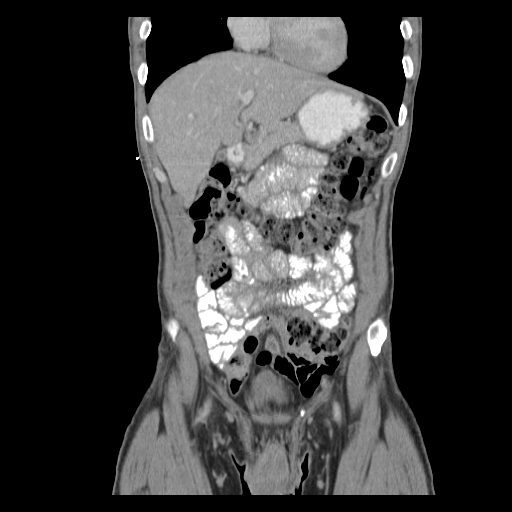
[im 58/130  soft-tissue]
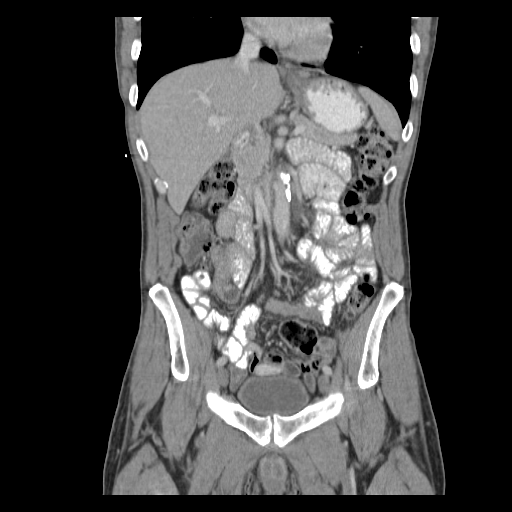
[im 72/130  soft-tissue]
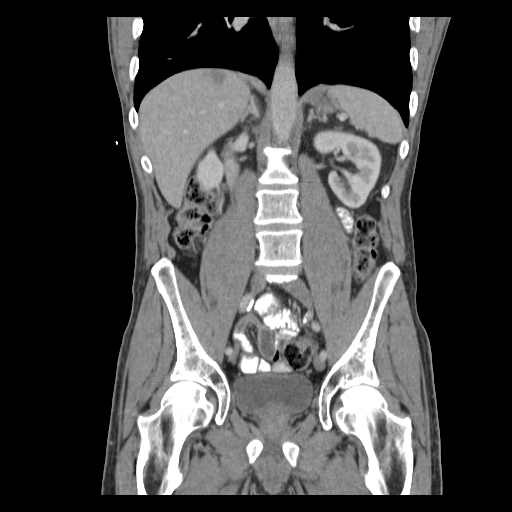

[14 of 42 positions shown; findings below may reference images not displayed]

FINDINGS: Visualized lung bases are clear. No pleural or pericardial effusion.
Single 3 mm nodule noted within the right middle lobe (series 4,
image 7).

2.0 x 2.6 cm hypodense lesion with irregular peripheral nodular
enhancement within the hepatic dome noted, most consistent with a
benign hemangioma. Liver is otherwise unremarkable.

Gallbladder is decompressed but grossly normal. No biliary
dilatation. Spleen, adrenal glands, and pancreas demonstrate a
normal contrast enhanced appearance.

Kidneys are equal in size with symmetric enhancement. Single 3 mm
nonobstructive stone present within the upper pole the right kidney.
No other radiopaque calculi identified. No hydronephrosis. No focal
enhancing renal mass.

Small hiatal hernia noted. Stomach within normal limits. No evidence
for bowel obstruction. No acute inflammatory changes seen about the
bowels. Appendix well visualized in the right lower quadrant and is
of normal caliber and appearance without associated inflammatory
changes to suggest acute appendicitis. Moderate amount of retained
stool diffusely throughout the colon, suggesting constipation.

Mild circumferential bladder wall thickening noted, likely related
to incomplete distension. Prostate within normal limits.

No free air or fluid. No pathologically enlarged intra-abdominal or
pelvic lymph nodes identified. Moderate aorto bi-iliac atheromatous
plaque. No aneurysm. There is a somewhat ill-defined hypodense
lesion that measures approximately 1.4 x 3.0 x 4.4 cm within the
left periaortic region (series 2, image 39). This measures simple
fluid density, and may reflect a small lymphocele or seroma.

No acute osseous abnormality. Small 1 cm lucent lesion within the
central sacrum noted (series 6, image 90), of doubtful clinical
significance. No worrisome lytic or blastic osseous lesions
identified.
IMPRESSION: 1. No CT evidence for acute intra-abdominal or pelvic process
identified.
2. Moderate amount of retained stool within the colon, suggesting
constipation.
3. 2.0 x 2.6 x 1.8 cm benign hemangioma within the right hepatic
lobe.
4. 3 mm nonobstructive right renal calculus.
5. Somewhat ill-defined cystic lesion within the left periaortic
region. This measures simple fluid density, and may reflect a small
lymphocele or possibly seroma. This is of doubtful clinical
significance.
6. Moderate aorto bi-iliac atheromatous disease.  No aneurysm.

## 2014-10-17 MED ORDER — MAGNESIUM CITRATE PO SOLN
ORAL | Status: AC
  Filled 2014-10-17: qty 296

## 2014-10-17 MED ORDER — IOHEXOL 240 MG/ML SOLN
25.0000 mL | Freq: Once | INTRAMUSCULAR | Status: AC | PRN
  Administered 2014-10-17: 25 mL via ORAL

## 2014-10-17 MED ORDER — SODIUM CHLORIDE 0.9 % IV BOLUS (SEPSIS)
1000.0000 mL | Freq: Once | INTRAVENOUS | Status: AC
  Administered 2014-10-17: 1000 mL via INTRAVENOUS

## 2014-10-17 MED ORDER — INSULIN ASPART 100 UNIT/ML ~~LOC~~ SOLN
8.0000 [IU] | Freq: Once | SUBCUTANEOUS | Status: AC
  Administered 2014-10-17: 8 [IU] via SUBCUTANEOUS

## 2014-10-17 MED ORDER — IOHEXOL 300 MG/ML  SOLN
80.0000 mL | Freq: Once | INTRAMUSCULAR | Status: AC | PRN
  Administered 2014-10-17: 80 mL via INTRAVENOUS

## 2014-10-17 MED ORDER — MAGNESIUM CITRATE PO SOLN
1.0000 | Freq: Once | ORAL | Status: AC
Start: 2014-10-17 — End: 2014-10-17
  Administered 2014-10-17: 1 via ORAL

## 2014-10-17 MED ORDER — INSULIN ASPART 100 UNIT/ML ~~LOC~~ SOLN
SUBCUTANEOUS | Status: AC
  Filled 2014-10-17: qty 8

## 2014-10-17 NOTE — ED Notes (Signed)
Pt resting comfortably with eyes closed, no distress noted.  

## 2014-10-17 NOTE — ED Provider Notes (Signed)
[facility] Emergency Department Provider Note  ____________________________________________  Time seen: Clock a.m.  I have reviewed the triage vital signs and the nursing notes.   HISTORY  Chief Complaint Abdominal Pain and Emesis      HPI Nicholas Weiss is a 64 y.o. male presents with epigastric abdominal pain that is currently 8 out of 10 and history of vomiting 2 days. Of note patient is a history of diabetes and states that his sugars been elevated tonight. Patient denies any fever and no diarrhea    Past Medical History  Diagnosis Date  . Diabetes mellitus without complication     There are no active problems to display for this patient.   Past Surgical History  Procedure Laterality Date  . Knee surgery      No current outpatient prescriptions on file.  Allergies Penicillins  No family history on file.  Social History History  Substance Use Topics  . Smoking status: Never Smoker   . Smokeless tobacco: Never Used  . Alcohol Use: Yes    Review of Systems  Constitutional: Negative for fever. Eyes: Negative for visual changes. ENT: Negative for sore throat. Cardiovascular: Negative for chest pain. Respiratory: Negative for shortness of breath. Gastrointestinal: Positive for abdominal pain and vomiting. Genitourinary: Negative for dysuria. Musculoskeletal: Negative for back pain. Skin: Negative for rash. Neurological: Negative for headaches, focal weakness or numbness.   10-point ROS otherwise negative.  ____________________________________________   PHYSICAL EXAM:  VITAL SIGNS: ED Triage Vitals  Enc Vitals Group     BP 10/16/14 2215 158/94 mmHg     Pulse Rate 10/16/14 2215 96     Resp 10/16/14 2215 22     Temp 10/16/14 2215 98.9 F (37.2 C)     Temp Source 10/16/14 2215 Oral     SpO2 10/16/14 2215 98 %     Weight 10/16/14 2215 150 lb (68.04 kg)     Height 10/16/14 2215 5\' 10"  (1.778 m)     Head Cir --    Peak Flow --      Pain Score 10/16/14 2216 9     Pain Loc --      Pain Edu? --      Excl. in [place]? --      Constitutional: Alert and oriented. Well appearing and in no distress. Eyes: Conjunctivae are normal. PERRL. Normal extraocular movements. ENT   Head: Normocephalic and atraumatic.   Nose: No congestion/rhinnorhea.   Mouth/Throat: Mucous membranes are moist.   Neck: No stridor. Cardiovascular: Normal rate, regular rhythm. Normal and symmetric distal pulses are present in all extremities. No murmurs, rubs, or gallops. Respiratory: Normal respiratory effort without tachypnea nor retractions. Breath sounds are clear and equal bilaterally. No wheezes/rales/rhonchi. Gastrointestinal: Positive epigastric pain with palpation. No distention. There is no CVA tenderness. Genitourinary: deferred Musculoskeletal: Nontender with normal range of motion in all extremities. No joint effusions.  No lower extremity tenderness nor edema. Neurologic:  Normal speech and language. No gross focal neurologic deficits are appreciated. Speech is normal.  Skin:  Skin is warm, dry and intact. No rash noted. Psychiatric: Mood and affect are normal. Speech and behavior are normal. Patient exhibits appropriate insight and judgment.  ____________________________________________    LABS (pertinent positives/negatives)  Labs Reviewed  CBC WITH DIFFERENTIAL/PLATELET - Abnormal; Notable for the following:    Lymphs Abs 0.9 (*)    All other components within normal limits  COMPREHENSIVE METABOLIC PANEL - Abnormal; Notable for the following:  Chloride 97 (*)    Glucose, Bld 410 (*)    BUN 29 (*)    Creatinine, Ser 1.93 (*)    Total Protein 8.7 (*)    GFR calc non Af Amer 36 (*)    GFR calc Af Amer 41 (*)    All other components within normal limits  URINALYSIS COMPLETEWITH MICROSCOPIC ([facility] ONLY) - Abnormal; Notable for the following:    Color, Urine STRAW (*)    APPearance CLEAR (*)     Glucose, UA >500 (*)    Ketones, ur 1+ (*)    Hgb urine dipstick 1+ (*)    Squamous Epithelial / LPF 0-5 (*)    All other components within normal limits  GLUCOSE, CAPILLARY - Abnormal; Notable for the following:    Glucose-Capillary 335 (*)    All other components within normal limits  GLUCOSE, CAPILLARY - Abnormal; Notable for the following:    Glucose-Capillary 338 (*)    All other components within normal limits  LIPASE, BLOOD       RADIOLOGY  Ultrasound of the abdomen revealed: IMPRESSION: 1. No acute abnormality seen in the right upper quadrant. 2. Hemangiomas again noted within the liver. Liver otherwise unremarkable.   CT of the abdomen revealed: IMPRESSION: 1. No CT evidence for acute intra-abdominal or pelvic process identified. 2. Moderate amount of retained stool within the colon, suggesting constipation. 3. 2.0 x 2.6 x 1.8 cm benign hemangioma within the right hepatic lobe. 4. 3 mm nonobstructive right renal calculus. 5. Somewhat ill-defined cystic lesion within the left periaortic region. This measures simple fluid density, and may reflect a small lymphocele or possibly seroma. This is of doubtful clinical significance. 6. Moderate aorto bi-iliac atheromatous disease. No aneurysm.   Electronically Signed By: Jeannine Boga M.D. On: 10/17/2014 06:03          INITIAL IMPRESSION / ASSESSMENT AND PLAN / ED COURSE  Pertinent labs & imaging results that were available during my care of the patient were reviewed by me and considered in my medical decision making (see chart for details).   ____________________________________________   FINAL CLINICAL IMPRESSION(S) / ED DIAGNOSES  Final diagnoses:  Constipation, unspecified constipation type  Right kidney stone      Gregor Hams, MD 10/17/14 [phone]

## 2014-10-17 NOTE — ED Notes (Signed)
Pt given oral contrast for CT

## 2014-10-17 NOTE — Discharge Instructions (Signed)
Constipation °Constipation is when a person has fewer than three bowel movements a week, has difficulty having a bowel movement, or has stools that are dry, hard, or larger than normal. As people grow older, constipation is more common. If you try to fix constipation with medicines that make you have a bowel movement (laxatives), the problem may get worse. Long-term laxative use may cause the muscles of the colon to become weak. A low-fiber diet, not taking in enough fluids, and taking certain medicines may make constipation worse.  °CAUSES  °· Certain medicines, such as antidepressants, pain medicine, iron supplements, antacids, and water pills.   °· Certain diseases, such as diabetes, irritable bowel syndrome (IBS), thyroid disease, or depression.   °· Not drinking enough water.   °· Not eating enough fiber-rich foods.   °· Stress or travel.   °· Lack of physical activity or exercise.   °· Ignoring the urge to have a bowel movement.   °· Using laxatives too much.   °SIGNS AND SYMPTOMS  °· Having fewer than three bowel movements a week.   °· Straining to have a bowel movement.   °· Having stools that are hard, dry, or larger than normal.   °· Feeling full or bloated.   °· Pain in the lower abdomen.   °· Not feeling relief after having a bowel movement.   °DIAGNOSIS  °Your health care provider will take a medical history and perform a physical exam. Further testing may be done for severe constipation. Some tests may include: °· A barium enema X-ray to examine your rectum, colon, and, sometimes, your small intestine.   °· A sigmoidoscopy to examine your lower colon.   °· A colonoscopy to examine your entire colon. °TREATMENT  °Treatment will depend on the severity of your constipation and what is causing it. Some dietary treatments include drinking more fluids and eating more fiber-rich foods. Lifestyle treatments may include regular exercise. If these diet and lifestyle recommendations do not help, your health care  provider may recommend taking over-the-counter laxative medicines to help you have bowel movements. Prescription medicines may be prescribed if over-the-counter medicines do not work.  °HOME CARE INSTRUCTIONS  °· Eat foods that have a lot of fiber, such as fruits, vegetables, whole grains, and beans. °· Limit foods high in fat and processed sugars, such as french fries, hamburgers, cookies, candies, and soda.   °· A fiber supplement may be added to your diet if you cannot get enough fiber from foods.   °· Drink enough fluids to keep your urine clear or pale yellow.   °· Exercise regularly or as directed by your health care provider.   °· Go to the restroom when you have the urge to go. Do not hold it.   °· Only take over-the-counter or prescription medicines as directed by your health care provider. Do not take other medicines for constipation without talking to your health care provider first.   °SEEK IMMEDIATE MEDICAL CARE IF:  °· You have bright red blood in your stool.   °· Your constipation lasts for more than 4 days or gets worse.   °· You have abdominal or rectal pain.   °· You have thin, pencil-like stools.   °· You have unexplained weight loss. °MAKE SURE YOU:  °· Understand these instructions. °· Will watch your condition. °· Will get help right away if you are not doing well or get worse. °Document Released: 12/13/2003 Document Revised: 03/21/2013 Document Reviewed: 12/26/2012 °[employer]® Patient Information ©2015 [employer]. This information is not intended to replace advice given to you by your health care provider. Make sure you discuss any questions   you have with your health care provider.  Kidney Stones Kidney stones (urolithiasis) are deposits that form inside your kidneys. The intense pain is caused by the stone moving through the urinary tract. When the stone moves, the ureter goes into spasm around the stone. The stone is usually passed in the urine.  CAUSES   A disorder that makes  certain neck glands produce too much parathyroid hormone (primary hyperparathyroidism).  A buildup of uric acid crystals, similar to gout in your joints.  Narrowing (stricture) of the ureter.  A kidney obstruction present at birth (congenital obstruction).  Previous surgery on the kidney or ureters.  Numerous kidney infections. SYMPTOMS   Feeling sick to your stomach (nauseous).  Throwing up (vomiting).  Blood in the urine (hematuria).  Pain that usually spreads (radiates) to the groin.  Frequency or urgency of urination. DIAGNOSIS   Taking a history and physical exam.  Blood or urine tests.  CT scan.  Occasionally, an examination of the inside of the urinary bladder (cystoscopy) is performed. TREATMENT   Observation.  Increasing your fluid intake.  Extracorporeal shock wave lithotripsy--This is a noninvasive procedure that uses shock waves to break up kidney stones.  Surgery may be needed if you have severe pain or persistent obstruction. There are various surgical procedures. Most of the procedures are performed with the use of small instruments. Only small incisions are needed to accommodate these instruments, so recovery time is minimized. The size, location, and chemical composition are all important variables that will determine the proper choice of action for you. Talk to your health care provider to better understand your situation so that you will minimize the risk of injury to yourself and your kidney.  HOME CARE INSTRUCTIONS   Drink enough water and fluids to keep your urine clear or pale yellow. This will help you to pass the stone or stone fragments.  Strain all urine through the provided strainer. Keep all particulate matter and stones for your health care provider to see. The stone causing the pain may be as small as a grain of salt. It is very important to use the strainer each and every time you pass your urine. The collection of your stone will allow  your health care provider to analyze it and verify that a stone has actually passed. The stone analysis will often identify what you can do to reduce the incidence of recurrences.  Only take over-the-counter or prescription medicines for pain, discomfort, or fever as directed by your health care provider.  Make a follow-up appointment with your health care provider as directed.  Get follow-up X-rays if required. The absence of pain does not always mean that the stone has passed. It may have only stopped moving. If the urine remains completely obstructed, it can cause loss of kidney function or even complete destruction of the kidney. It is your responsibility to make sure X-rays and follow-ups are completed. Ultrasounds of the kidney can show blockages and the status of the kidney. Ultrasounds are not associated with any radiation and can be performed easily in a matter of minutes. SEEK MEDICAL CARE IF:  You experience pain that is progressive and unresponsive to any pain medicine you have been prescribed. SEEK IMMEDIATE MEDICAL CARE IF:   Pain cannot be controlled with the prescribed medicine.  You have a fever or shaking chills.  The severity or intensity of pain increases over 18 hours and is not relieved by pain medicine.  You develop a new onset of  abdominal pain.  You feel faint or pass out.  You are unable to urinate. MAKE SURE YOU:   Understand these instructions.  Will watch your condition.  Will get help right away if you are not doing well or get worse. Document Released: 03/16/2005 Document Revised: 11/16/2012 Document Reviewed: 08/17/2012 [facility] Patient Information 2015 [place], [place]. This information is not intended to replace advice given to you by your health care provider. Make sure you discuss any questions you have with your health care provider.

## 2014-10-17 NOTE — ED Notes (Signed)
Report received from Marisa Severin RN. Patient care assumed. Patient/RN introduction complete. Will continue to monitor.

## 2014-10-17 NOTE — ED Notes (Signed)
Pt to ct 

## 2014-10-17 NOTE — ED Notes (Signed)
MD at bedside. 

## 2014-10-17 NOTE — ED Notes (Signed)
Patient discharged to home per MD order. Patient in stable condition, and deemed medically cleared by ED provider for discharge. Discharge instructions reviewed with patient/family using "Teach Back"; verbalized understanding of medication education and administration, and information about follow-up care. Denies further concerns. ° °

## 2015-01-12 ENCOUNTER — Emergency Department: Payer: Self-pay

## 2015-01-12 ENCOUNTER — Encounter: Payer: Self-pay | Admitting: *Deleted

## 2015-01-12 ENCOUNTER — Emergency Department
Admission: EM | Admit: 2015-01-12 | Discharge: 2015-01-12 | Disposition: A | Discharge status: Home / Self Care | Payer: Self-pay | Attending: Student | Admitting: Student

## 2015-01-12 DIAGNOSIS — R51 Headache: Secondary | ICD-10-CM | POA: Insufficient documentation

## 2015-01-12 DIAGNOSIS — Z794 Long term (current) use of insulin: Secondary | ICD-10-CM | POA: Insufficient documentation

## 2015-01-12 DIAGNOSIS — E1165 Type 2 diabetes mellitus with hyperglycemia: Secondary | ICD-10-CM | POA: Insufficient documentation

## 2015-01-12 DIAGNOSIS — R1013 Epigastric pain: Secondary | ICD-10-CM | POA: Insufficient documentation

## 2015-01-12 DIAGNOSIS — I10 Essential (primary) hypertension: Secondary | ICD-10-CM | POA: Insufficient documentation

## 2015-01-12 DIAGNOSIS — R1010 Upper abdominal pain, unspecified: Secondary | ICD-10-CM | POA: Insufficient documentation

## 2015-01-12 DIAGNOSIS — R42 Dizziness and giddiness: Secondary | ICD-10-CM | POA: Insufficient documentation

## 2015-01-12 DIAGNOSIS — Z79899 Other long term (current) drug therapy: Secondary | ICD-10-CM | POA: Insufficient documentation

## 2015-01-12 DIAGNOSIS — R739 Hyperglycemia, unspecified: Secondary | ICD-10-CM

## 2015-01-12 DIAGNOSIS — R079 Chest pain, unspecified: Secondary | ICD-10-CM | POA: Insufficient documentation

## 2015-01-12 DIAGNOSIS — M542 Cervicalgia: Secondary | ICD-10-CM | POA: Insufficient documentation

## 2015-01-12 DIAGNOSIS — R0602 Shortness of breath: Secondary | ICD-10-CM | POA: Insufficient documentation

## 2015-01-12 DIAGNOSIS — R110 Nausea: Secondary | ICD-10-CM | POA: Insufficient documentation

## 2015-01-12 DIAGNOSIS — Z880 Allergy status to penicillin: Secondary | ICD-10-CM | POA: Insufficient documentation

## 2015-01-12 HISTORY — DX: Pure hypercholesterolemia, unspecified: E78.00

## 2015-01-12 HISTORY — DX: Essential (primary) hypertension: I10

## 2015-01-12 LAB — TROPONIN I: Troponin I: <0.03 ng/mL (ref <0.031)

## 2015-01-12 LAB — CBC
HEMATOCRIT: 38.2 % — AB (ref 40.0–52.0)
HEMOGLOBIN: 12.6 g/dL — AB (ref 13.0–18.0)
MCH: 30.5 pg (ref 26.0–34.0)
MCHC: 33.1 g/dL (ref 32.0–36.0)
MCV: 92 fL (ref 80.0–100.0)
Platelets: 238 10*3/uL (ref 150–440)
RBC: 4.15 MIL/uL — AB (ref 4.40–5.90)
RDW: 13.3 % (ref 11.5–14.5)
WBC: 7.2 10*3/uL (ref 3.8–10.6)

## 2015-01-12 LAB — COMPREHENSIVE METABOLIC PANEL
ALBUMIN: 3.8 g/dL (ref 3.5–5.0)
ALT: 16 U/L — ABNORMAL LOW (ref 17–63)
AST: 18 U/L (ref 15–41)
Alkaline Phosphatase: 64 U/L (ref 38–126)
Anion gap: 14 (ref 5–15)
BUN: 30 mg/dL — AB (ref 6–20)
CHLORIDE: 94 mmol/L — AB (ref 101–111)
CO2: 22 mmol/L (ref 22–32)
Calcium: 8.9 mg/dL (ref 8.9–10.3)
Creatinine, Ser: 1.73 mg/dL — ABNORMAL HIGH (ref 0.61–1.24)
GFR calc Af Amer: 47 mL/min — ABNORMAL LOW (ref >60)
GFR, EST NON AFRICAN AMERICAN: 41 mL/min — AB (ref >60)
Glucose, Bld: 547 mg/dL (ref 65–99)
POTASSIUM: 4.9 mmol/L (ref 3.5–5.1)
SODIUM: 130 mmol/L — AB (ref 135–145)
Total Bilirubin: 1.1 mg/dL (ref 0.3–1.2)
Total Protein: 7.6 g/dL (ref 6.5–8.1)

## 2015-01-12 LAB — LIPASE, BLOOD: LIPASE: 24 U/L (ref 22–51)

## 2015-01-12 LAB — GLUCOSE, CAPILLARY
GLUCOSE-CAPILLARY: 294 mg/dL — AB (ref 65–99)
GLUCOSE-CAPILLARY: 433 mg/dL — AB (ref 65–99)
GLUCOSE-CAPILLARY: 481 mg/dL — AB (ref 65–99)
Glucose-Capillary: 398 mg/dL — ABNORMAL HIGH (ref 65–99)

## 2015-01-12 LAB — LACTIC ACID, PLASMA: Lactic Acid, Venous: 1.8 mmol/L (ref 0.5–2.0)

## 2015-01-12 IMAGING — CR DG CHEST 2V
1 series · 2 of 2 positions shown · non-contrast
Comparison: None.

CLINICAL DATA: Upper left chest pain for 2-3 days

EXAM:
CHEST  2 VIEW

[Series 1: dg chest 2 view · 0.14mm/px · 2 of 2 slices shown]
[im 1/2]
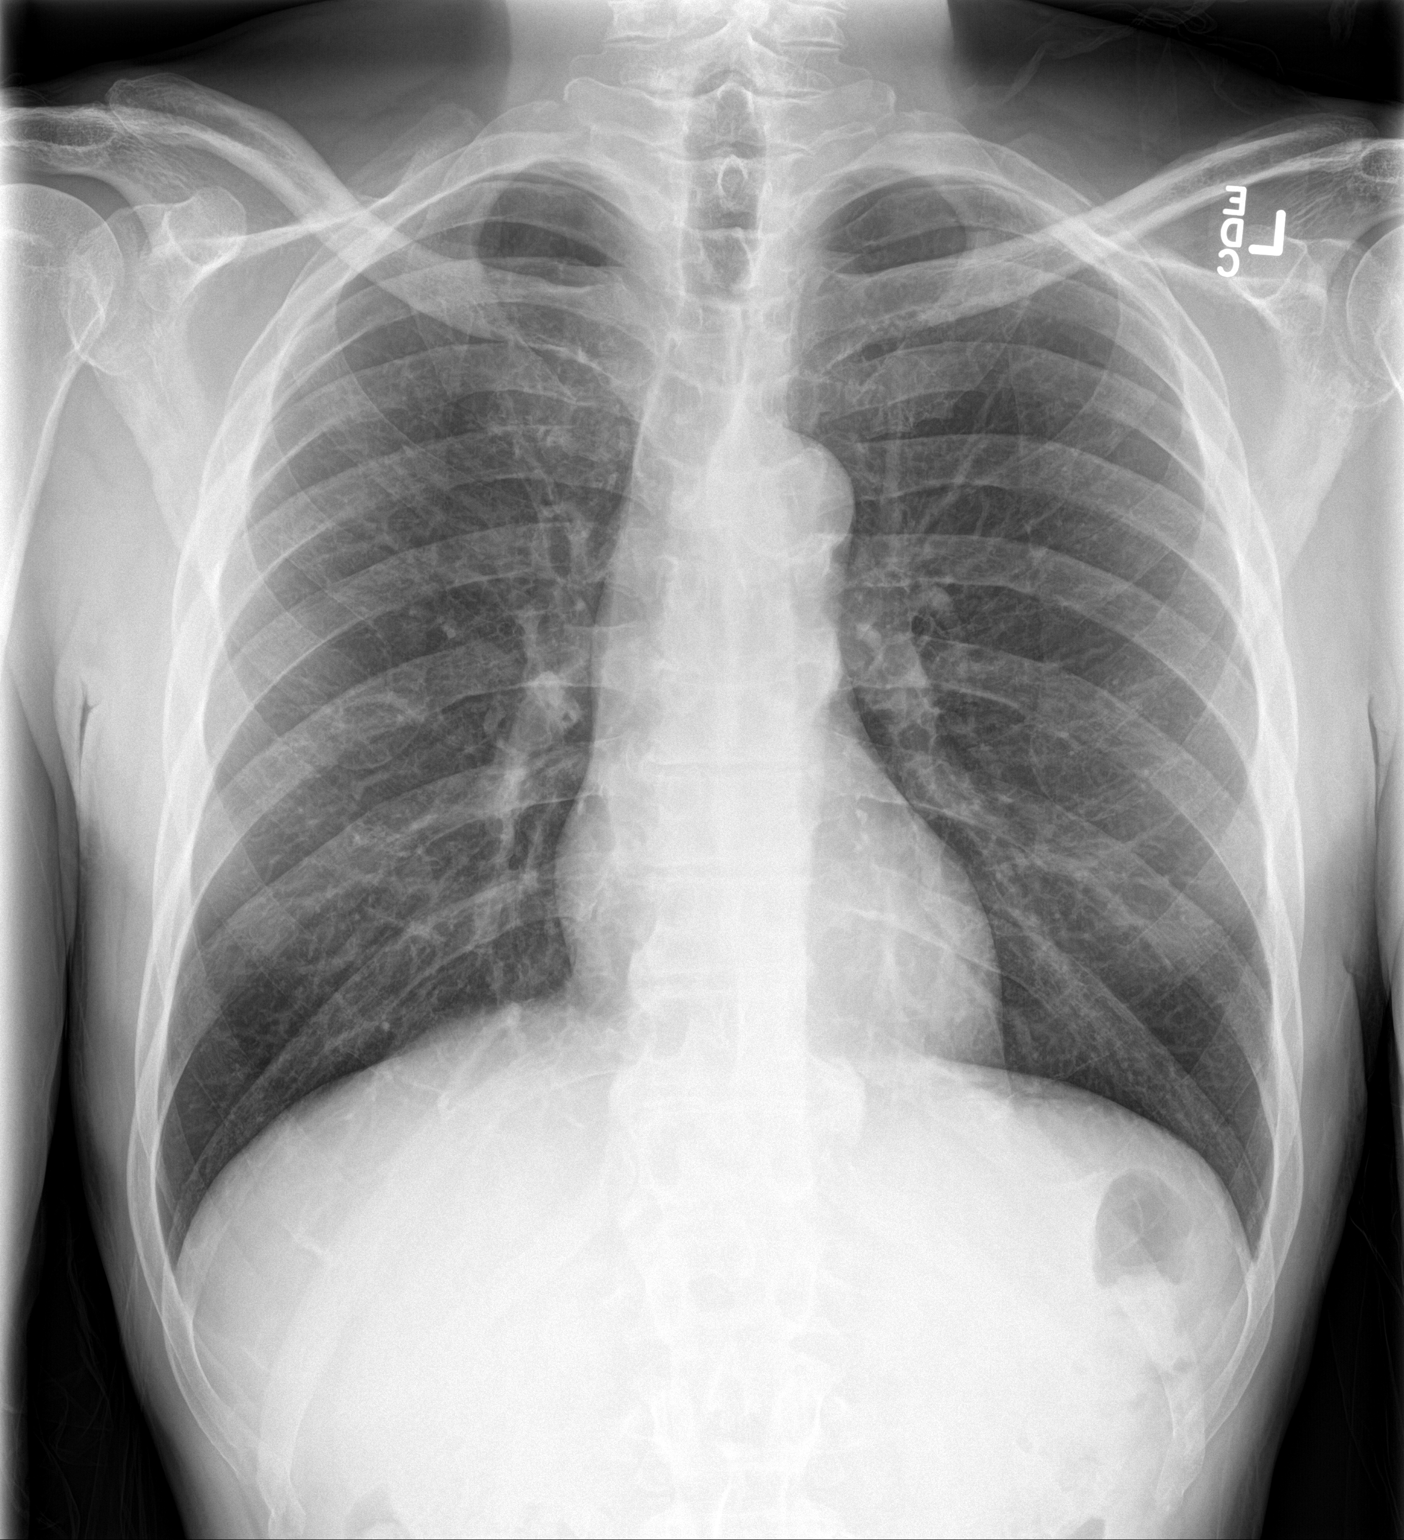
[im 2/2]
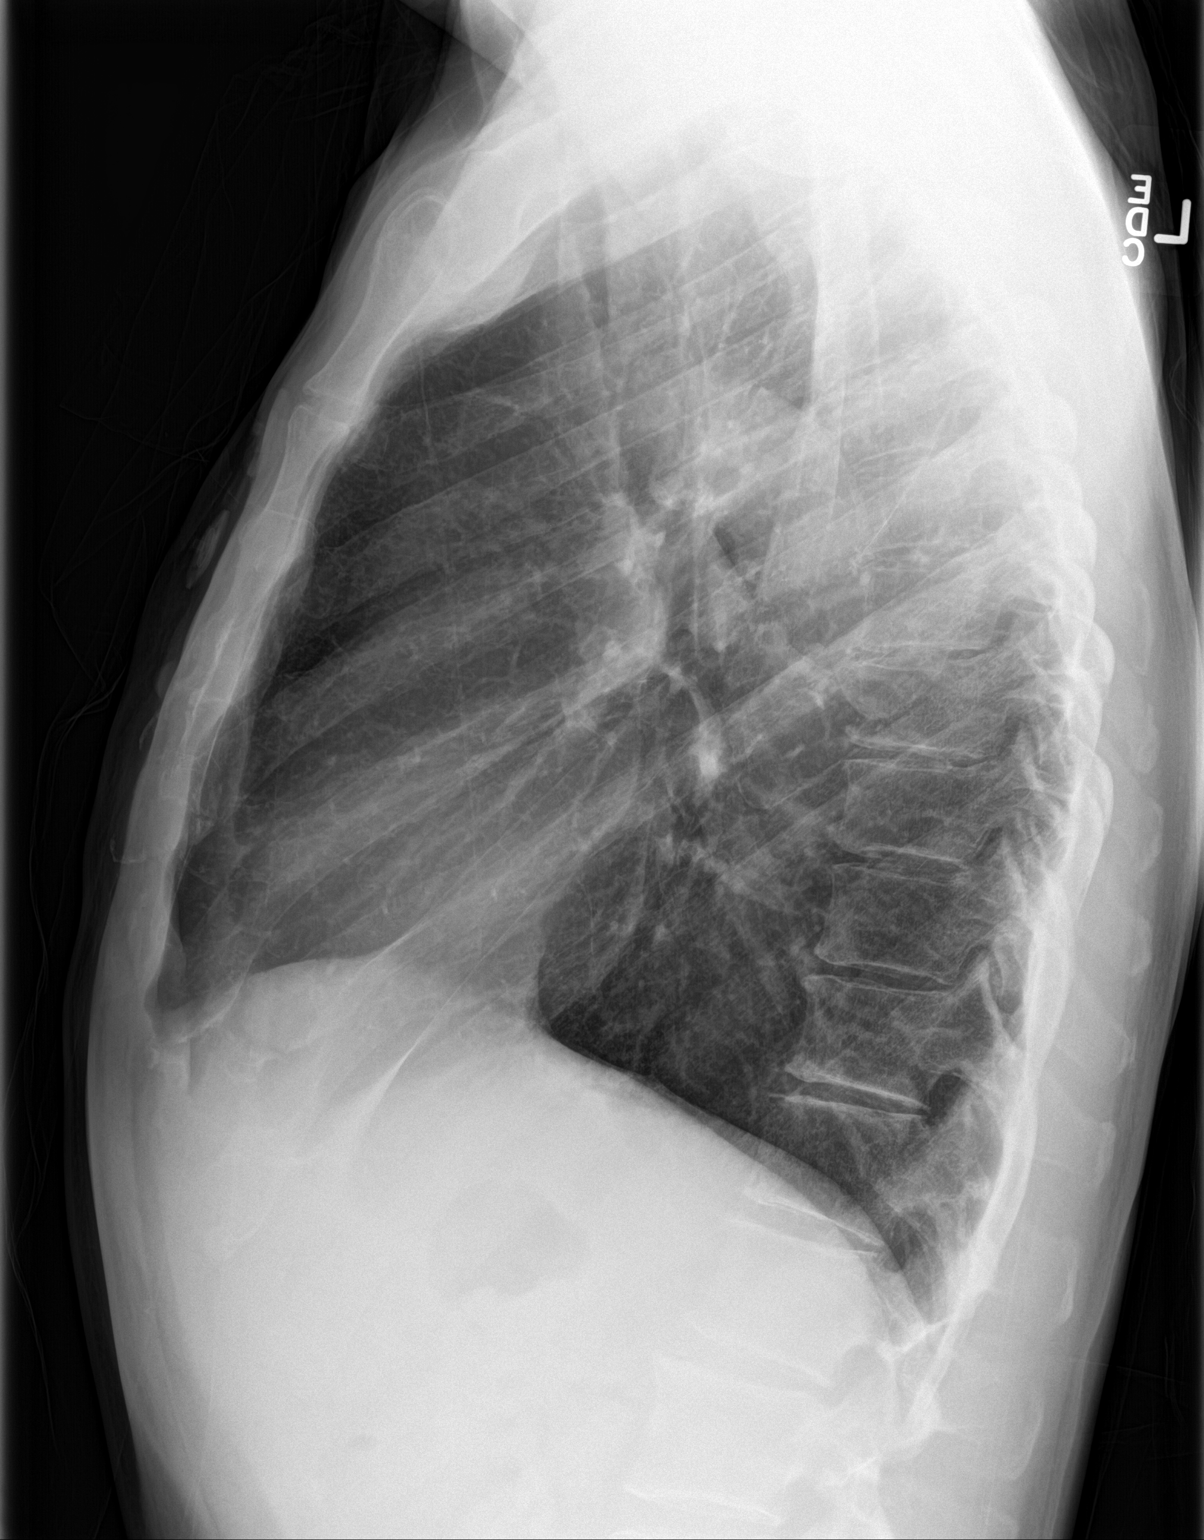

[2 of 2 positions shown; findings below may reference images not displayed]

FINDINGS: The heart size and mediastinal contours are within normal limits.
Both lungs are clear. The visualized skeletal structures are
unremarkable.
IMPRESSION: No active cardiopulmonary disease.

## 2015-01-12 IMAGING — CT CT ANGIO CHEST
2 of 6 series · 17 of 36 positions shown · IV contrast (APPLIED)
Comparison: None.

CLINICAL DATA: Neck pain starting this morning reproducible with
movement. Chest pain starting at [DATE] today. History of diabetes.

EXAM:
CT ANGIOGRAPHY CHEST, ABDOMEN AND PELVIS
TECHNIQUE: Multidetector CT imaging through the chest, abdomen and pelvis was
performed using the standard protocol during bolus administration of
intravenous contrast. Multiplanar reconstructed images and MIPs were
obtained and reviewed to evaluate the vascular anatomy.
CONTRAST:  100mL OMNIPAQUE IOHEXOL 350 MG/ML SOLN

[Series 6: arterial · axial · arterial · 0.66mm/px · z∈[+616,+1220]mm · 16 of 334 slices shown]
[im 16/334  lung]
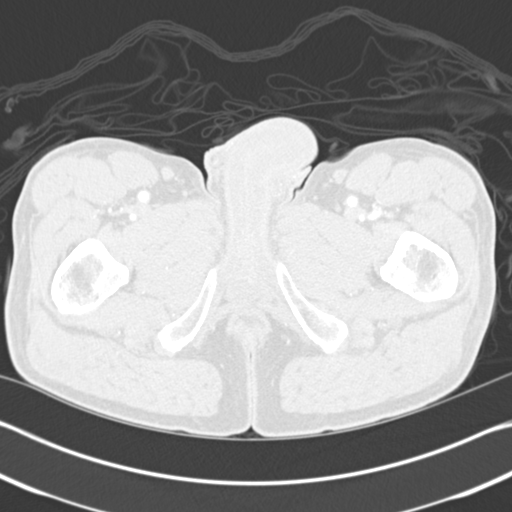
[im 31/334  mediastinal]
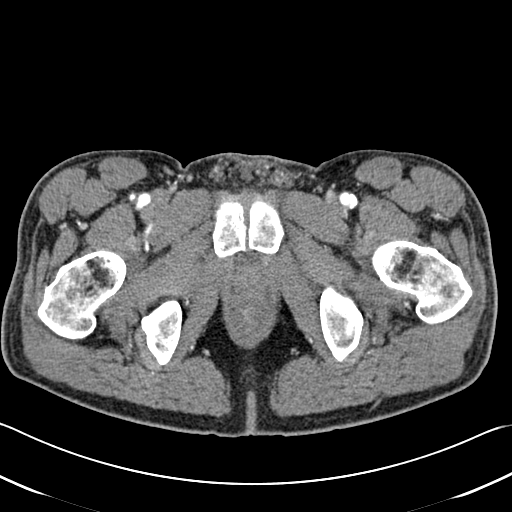
[im 61/334  lung]
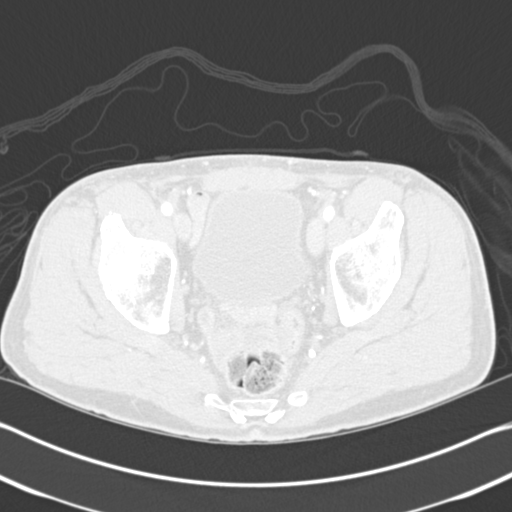
[im 76/334  mediastinal]
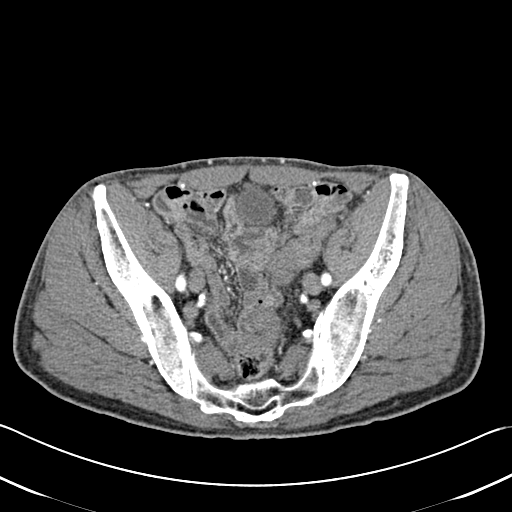
[im 91/334  lung]
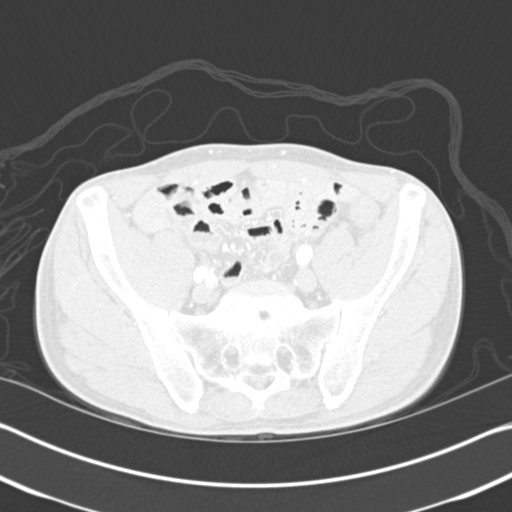
[im 122/334  mediastinal]
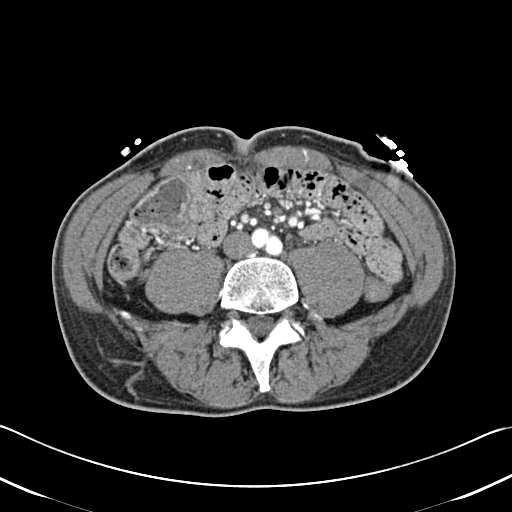
[im 137/334  lung]
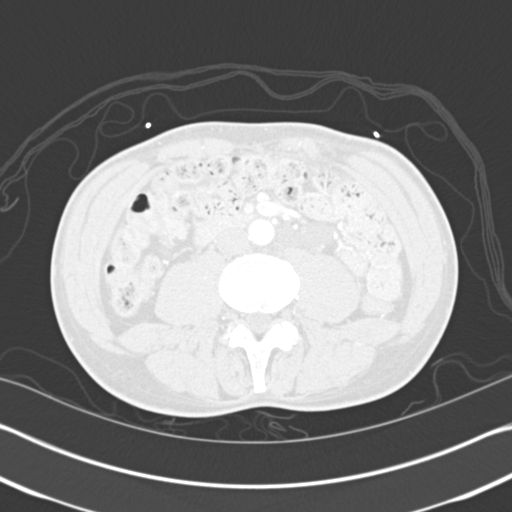
[im 152/334  mediastinal]
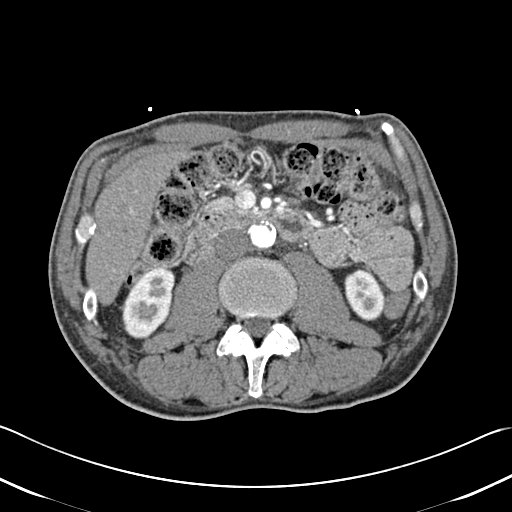
[im 182/334  lung]
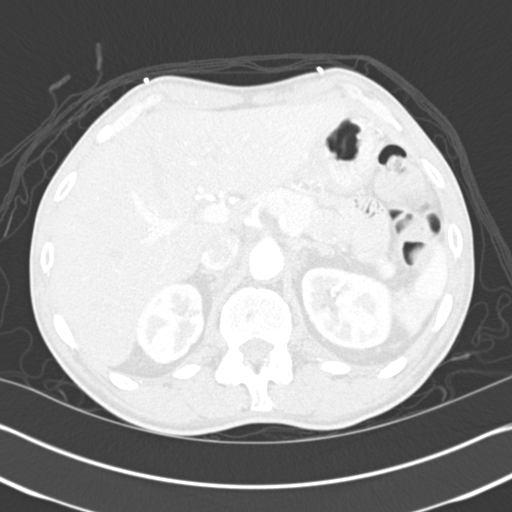
[im 197/334  mediastinal]
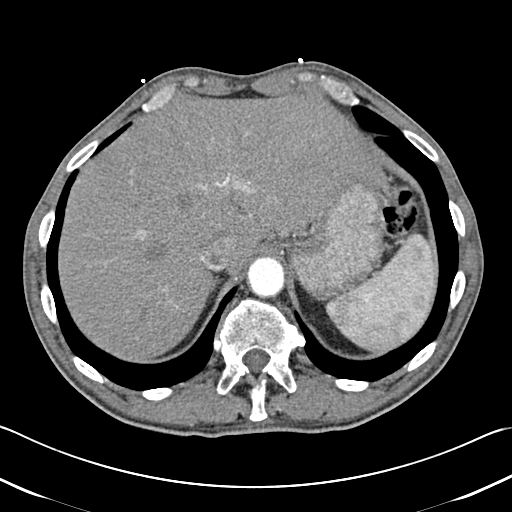
[im 212/334  lung]
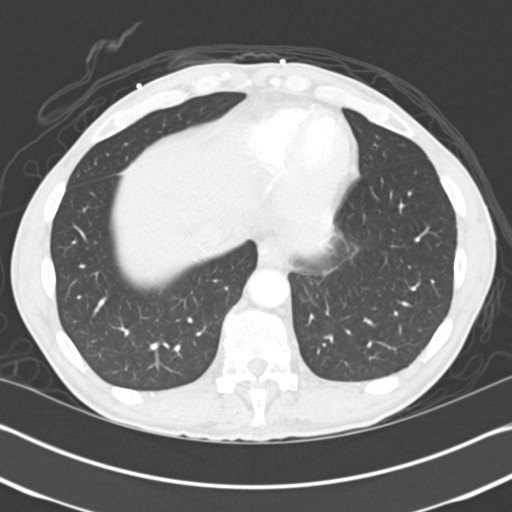
[im 243/334  mediastinal]
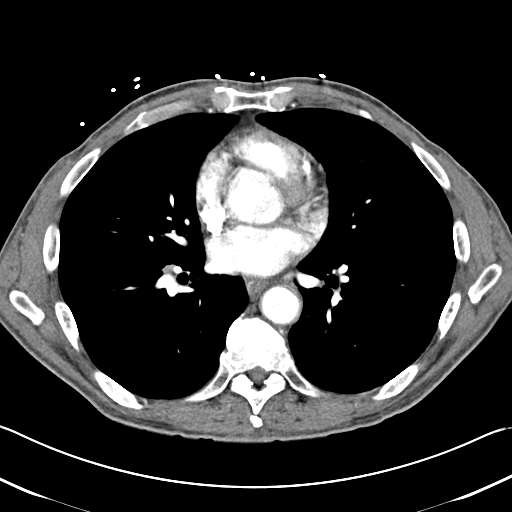
[im 258/334  lung]
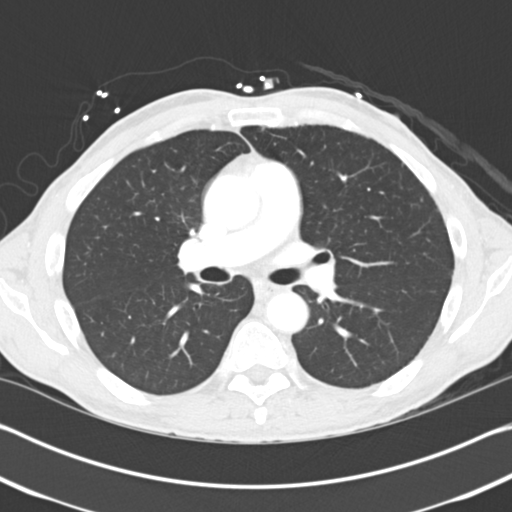
[im 273/334  mediastinal]
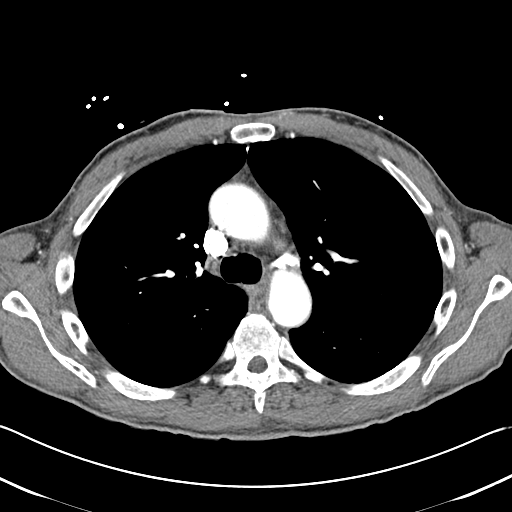
[im 303/334  lung]
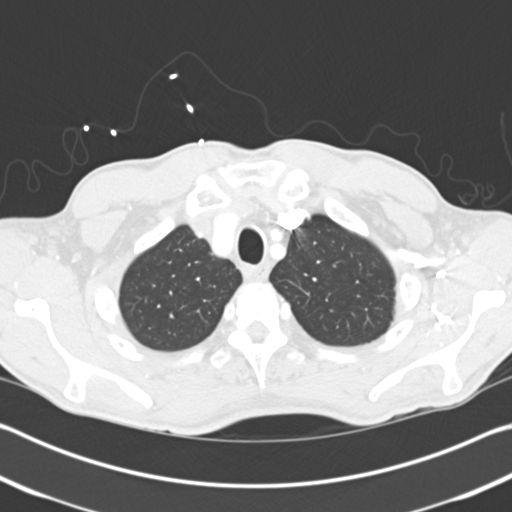
[im 318/334  mediastinal]
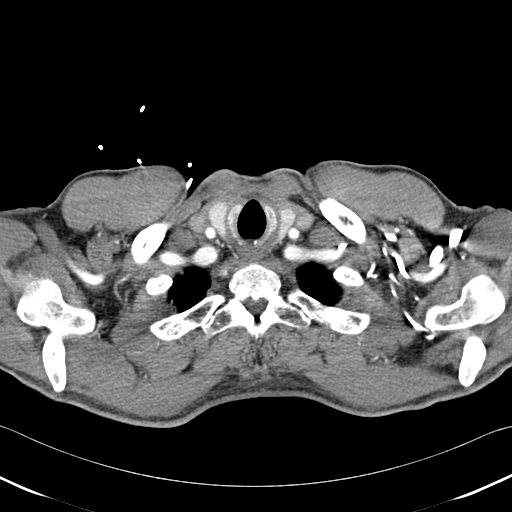

[Series 8: cor arterial mpr · coronal · arterial · 0.76mm/px · 1 of 125 slices shown]
[im 63/125  mediastinal]
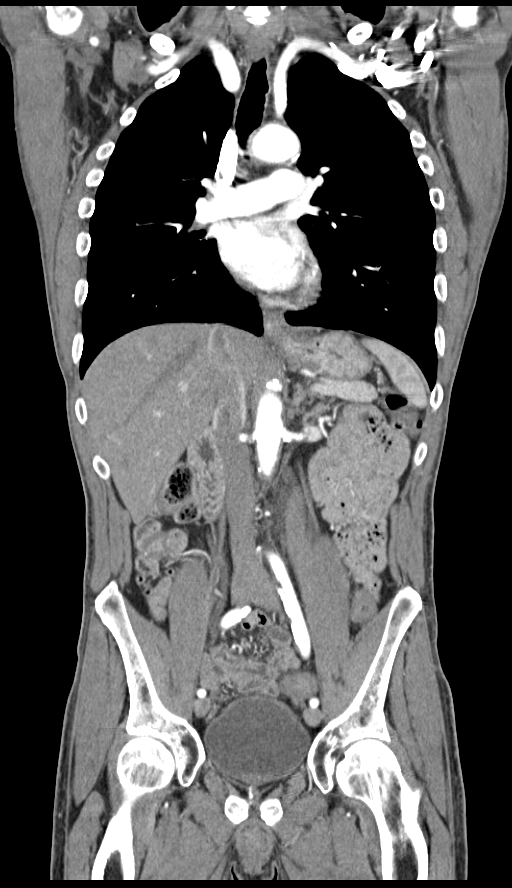

[17 of 36 positions shown; findings below may reference images not displayed]

FINDINGS: CTA CHEST FINDINGS

Unenhanced images of the chest demonstrate normal caliber thoracic
aorta with scattered calcifications. No intramural hematoma.
Coronary artery calcifications.

Images obtained during arterial phase of contrast bolus demonstrate
normal caliber thoracic aorta. No aortic dissection or aneurysm.
Motion artifact in the aortic root. Great vessel origins are patent.
Good opacification of the central and segmental pulmonary arteries.
No filling defects. No evidence of significant pulmonary embolus.

Esophagus is decompressed. No significant lymphadenopathy in the
chest. Lungs are clear and expanded. No focal airspace disease or
consolidation. There is a nodule in the superior segment left lower
lung measuring 10 mm maximal diameter. Suggest follow-up CT at
around 3, 9, and 24 months or PET-CT for further evaluation.

No pleural effusions.  No pneumothorax.  Airways appear patent.

Review of the MIP images confirms the above findings.

CTA ABDOMEN AND PELVIS FINDINGS

Normal caliber abdominal aorta with diffuse calcific and noncalcific
atherosclerotic change. No aneurysm or dissection. The abdominal
aorta, celiac axis, superior mesenteric artery, single bilateral
renal arteries, inferior mesenteric artery, and bilateral iliac,
external iliac, internal iliac, and common femoral arteries are
patent. Scattered calcifications are present throughout the
visualized major arteries. Renal nephrograms are symmetrical.

There is a 2.6 cm diameter hypodense lesion with peripheral nodular
enhancement demonstrated in segment 7 of the liver. This is
consistent with a hemangioma. Mild diffuse fatty infiltration of the
liver. The gallbladder, spleen, pancreas, adrenal glands, kidneys,
inferior vena cava, and retroperitoneal lymph nodes are
unremarkable. Portal and mesenteric veins are patent. Stomach, small
bowel, and colon are decompressed although the colon is stool
filled. No free air or free fluid in the abdomen. Abdominal wall
musculature appears intact.

Pelvis: Appendix is normal. Prostate gland is mildly enlarged.
Bladder wall is not thickened. No free or loculated pelvic fluid
collections. No pelvic mass or lymphadenopathy. No evidence of
diverticulitis.

Bones: Normal alignment of the thoracic and lumbar spine. No
vertebral compression deformities. Posterior elements appear intact.
Sternum and ribs are non depressed. Sacrum, pelvic, and hips appear
intact.

Review of the MIP images confirms the above findings.
IMPRESSION: No evidence of aneurysm or dissection of the thoracic or abdominal
aorta. Diffuse atherosclerotic changes are present. No significant
central pulmonary embolus. No acute process demonstrated in the
chest abdomen or pelvis. 1 cm nodule in the left lower lung. See
above followup recommendations. Fatty infiltration of the liver with
cavernous hemangioma in segment 6.

## 2015-01-12 MED ORDER — SODIUM CHLORIDE 0.9 % IV BOLUS (SEPSIS)
1000.0000 mL | Freq: Once | INTRAVENOUS | Status: AC
  Administered 2015-01-12: 1000 mL via INTRAVENOUS

## 2015-01-12 MED ORDER — INSULIN ASPART 100 UNIT/ML ~~LOC~~ SOLN
8.0000 [IU] | Freq: Once | SUBCUTANEOUS | Status: AC
  Administered 2015-01-12: 8 [IU] via SUBCUTANEOUS

## 2015-01-12 MED ORDER — MORPHINE SULFATE (PF) 4 MG/ML IV SOLN
4.0000 mg | Freq: Once | INTRAVENOUS | Status: AC
  Administered 2015-01-12: 4 mg via INTRAVENOUS
  Filled 2015-01-12: qty 1

## 2015-01-12 MED ORDER — OXYCODONE-ACETAMINOPHEN 5-325 MG PO TABS
1.0000 | ORAL_TABLET | Freq: Once | ORAL | Status: AC
  Administered 2015-01-12: 1 via ORAL
  Filled 2015-01-12: qty 1

## 2015-01-12 MED ORDER — INSULIN ASPART 100 UNIT/ML ~~LOC~~ SOLN
8.0000 [IU] | Freq: Once | SUBCUTANEOUS | Status: DC
  Filled 2015-01-12: qty 8

## 2015-01-12 MED ORDER — IOHEXOL 350 MG/ML SOLN
100.0000 mL | Freq: Once | INTRAVENOUS | Status: AC | PRN
  Administered 2015-01-12: 100 mL via INTRAVENOUS

## 2015-01-12 NOTE — ED Provider Notes (Signed)
-----------------------------------------   9:19 AM on 01/12/2015 -----------------------------------------  Care was assumed from Dr. Dahlia Client at 7 AM pending repeat glucose check. Glucose is now 294. The patient is comfortable. Vital signs stable. Heart rate was transiently elevated to the low 100s (this lasted less than 5 minutes) however has improved to 85 bpm at this time without any additional intervention. We'll discharge with return precautions, discharge infections provided by Dr. Dahlia Client, and PCP follow-up.  Joanne Gavel, MD 01/12/15 [phone]

## 2015-01-12 NOTE — ED Notes (Signed)
Pt. requested 2 blankets.

## 2015-01-12 NOTE — ED Notes (Signed)
Pt returned from CT °

## 2015-01-12 NOTE — ED Notes (Signed)
CBG 481 

## 2015-01-12 NOTE — ED Notes (Signed)
Pt presents w/ c/o neck pain starting this morning that is reproducible w/ movement. Pt c/o chest pain starting at 1400 today w/ accompanying sxs. Pt c/o abdominal starting at 1700. Pt denies cardiac hx, denies prior MI. Pt drove self to ED today.

## 2015-01-12 NOTE — ED Provider Notes (Signed)
[facility] Emergency Department Provider Note  ____________________________________________  Time seen: Approximately 0052 AM  I have reviewed the triage vital signs and the nursing notes.   HISTORY  Chief Complaint Chest Pain    HPI Nicholas Weiss is a 64 y.o. male with a history of diabetes and high blood pressure who comes in today with chest pain, neck pain and abdominal pain. The patient reports that the pain started around 5 PM when he was cleaning up. The patient reports that the pain felt like a flutter in his chest. The patient reports that he also had the abdominal pain as well. He reports that he does have some neck pain that has been going on for a couple of days that has been sharp and worse when he turns his neck side to side. The patient reports this pain is 8 out of 10 in intensity and although he would state is his whole body is mainly in his upper abdomen. The patient has had some nausea shortness of breath dizzy lightheadedness with no sweats no headache no problems with urination or bowel movements. The patient was concerned due to the pain today decided to come in for evaluation.   Past Medical History  Diagnosis Date  . Diabetes mellitus without complication ([place])   . Hypertension   . Hypercholesteremia     There are no active problems to display for this patient.   Past Surgical History  Procedure Laterality Date  . Knee surgery    . Knee surgery Left     Current Outpatient Rx  Name  Route  Sig  Dispense  Refill  . hydrochlorothiazide (HYDRODIURIL) 50 MG tablet   Oral   Take 1 tablet by mouth daily.         . insulin aspart (NOVOLOG) 100 UNIT/ML injection   Subcutaneous   Inject 10 Units into the skin 3 (three) times daily before meals. Per sliding scale         . insulin glargine (LANTUS) 100 UNIT/ML injection   Subcutaneous   Inject 20 Units into the skin at bedtime.         . lovastatin (MEVACOR) 40 MG tablet    Oral   Take 1 tablet by mouth daily.           Allergies Penicillins  History reviewed. No pertinent family history.  Social History Social History  Substance Use Topics  . Smoking status: Never Smoker   . Smokeless tobacco: Never Used  . Alcohol Use: 0.6 oz/week    1 Cans of beer per week     Comment: weekly    Review of Systems Constitutional: No fever/chills Eyes: No visual changes. ENT: No sore throat. Cardiovascular:  chest pain. Respiratory:  shortness of breath. Gastrointestinal: abdominal pain, nausea, no vomiting.  No diarrhea.  No constipation. Genitourinary: Negative for dysuria. Musculoskeletal: Negative for back pain. Skin: Negative for rash. Neurological: Headache  10-point ROS otherwise negative.  ____________________________________________   PHYSICAL EXAM:  VITAL SIGNS: ED Triage Vitals  Enc Vitals Group     BP 01/12/15 0020 147/102 mmHg     Pulse Rate 01/12/15 0020 127     Resp 01/12/15 0020 20     Temp 01/12/15 0020 98.3 F (36.8 C)     Temp Source 01/12/15 0020 Oral     SpO2 01/12/15 0020 98 %     Weight 01/12/15 0020 162 lb (73.483 kg)     Height 01/12/15 0020 5' 10.5" (1.791  m)     Head Cir --      Peak Flow --      Pain Score 01/12/15 0031 8     Pain Loc --      Pain Edu? --      Excl. in [place]? --     Constitutional: Alert and oriented. Well appearing and in no acute distress. Eyes: Conjunctivae are normal. PERRL. EOMI. Head: Atraumatic. Nose: No congestion/rhinnorhea. Mouth/Throat: Mucous membranes are moist.  Oropharynx non-erythematous. Cardiovascular: Normal rate, regular rhythm. Grossly normal heart sounds.  Good peripheral circulation. Respiratory: Normal respiratory effort.  No retractions. Lungs CTAB. Gastrointestinal: Soft and nontender. No distention. Positive bowel sounds Musculoskeletal: No lower extremity tenderness nor edema.   Neurologic:  Normal speech and language. No gross focal neurologic deficits are  appreciated. No gait instability. Skin:  Skin is warm, dry and intact.  Psychiatric: Mood and affect are normal.   ____________________________________________   LABS (all labs ordered are listed, but only abnormal results are displayed)  Labs Reviewed  CBC - Abnormal; Notable for the following:    RBC 4.15 (*)    Hemoglobin 12.6 (*)    HCT 38.2 (*)    All other components within normal limits  GLUCOSE, CAPILLARY - Abnormal; Notable for the following:    Glucose-Capillary 481 (*)    All other components within normal limits  COMPREHENSIVE METABOLIC PANEL - Abnormal; Notable for the following:    Sodium 130 (*)    Chloride 94 (*)    Glucose, Bld 547 (*)    BUN 30 (*)    Creatinine, Ser 1.73 (*)    ALT 16 (*)    GFR calc non Af Amer 41 (*)    GFR calc Af Amer 47 (*)    All other components within normal limits  GLUCOSE, CAPILLARY - Abnormal; Notable for the following:    Glucose-Capillary 433 (*)    All other components within normal limits  GLUCOSE, CAPILLARY - Abnormal; Notable for the following:    Glucose-Capillary 398 (*)    All other components within normal limits  TROPONIN I  LIPASE, BLOOD  LACTIC ACID, PLASMA  TROPONIN I  CBG MONITORING, ED  CBG MONITORING, ED   ____________________________________________  EKG  ED ECG REPORT I, Loney Hering, the attending physician, personally viewed and interpreted this ECG.   Date: 01/12/2015  EKG Time: 0028  Rate: 117  Rhythm: sinus tachycardia  Axis: normal  Intervals:none  ST&T Change: none  ____________________________________________  RADIOLOGY  CT angio chest/Abd/Pelvis: No evidence of aneurysm or dissection of the thoracic or abdominal aorta. Diffuse atherosclerotic changes are present, no significant central pulmonary embolus, no acute process demonstrated within the chest abdomen or pelvis. Chest x-ray: No cardiopulmonary  disease. ____________________________________________   PROCEDURES  Procedure(s) performed: None  Critical Care performed: No  ____________________________________________   INITIAL IMPRESSION / ASSESSMENT AND PLAN / ED COURSE  Pertinent labs & imaging results that were available during my care of the patient were reviewed by me and considered in my medical decision making (see chart for details).  This is a 64 year old male who comes in with chest pain. The patient also has some hyperglycemia with glucose of 547. The patient is tachycardic to the 1 teens. I'll give him a liter of normal saline check his blood work do a CT scan as the patient having chest pain belly pain and neck pain and reassess the patient.  The patient's blood work is unremarkable and the patient's  CT scan is unremarkable. I feel the patient may have been dehydrated which was the cause of his initial tachycardia. Patient's blood sugar is still elevated. He did receive 2 L of normal saline as well as 8 units of subcutaneous insulin. The patient's care was done at Dr. Darrick Penna who will continue to monitor the decrease of the patient's blood sugar and discharge him once his glucose is improved. ____________________________________________   FINAL CLINICAL IMPRESSION(S) / ED DIAGNOSES  Final diagnoses:  Chest pain, unspecified chest pain type  Epigastric pain  Hyperglycemia      Loney Hering, MD 01/12/15 [phone]

## 2015-01-12 NOTE — Discharge Instructions (Signed)
Abdominal Pain, Adult Many things can cause abdominal pain. Usually, abdominal pain is not caused by a disease and will improve without treatment. It can often be observed and treated at home. Your health care provider will do a physical exam and possibly order blood tests and X-rays to help determine the seriousness of your pain. However, in many cases, more time must pass before a clear cause of the pain can be found. Before that point, your health care provider may not know if you need more testing or further treatment. HOME CARE INSTRUCTIONS Monitor your abdominal pain for any changes. The following actions may help to alleviate any discomfort you are experiencing:  Only take over-the-counter or prescription medicines as directed by your health care provider.  Do not take laxatives unless directed to do so by your health care provider.  Try a clear liquid diet (broth, tea, or water) as directed by your health care provider. Slowly move to a bland diet as tolerated. SEEK MEDICAL CARE IF:  You have unexplained abdominal pain.  You have abdominal pain associated with nausea or diarrhea.  You have pain when you urinate or have a bowel movement.  You experience abdominal pain that wakes you in the night.  You have abdominal pain that is worsened or improved by eating food.  You have abdominal pain that is worsened with eating fatty foods.  You have a fever. SEEK IMMEDIATE MEDICAL CARE IF:  Your pain does not go away within 2 hours.  You keep throwing up (vomiting).  Your pain is felt only in portions of the abdomen, such as the right side or the left lower portion of the abdomen.  You pass bloody or black tarry stools. MAKE SURE YOU:  Understand these instructions.  Will watch your condition.  Will get help right away if you are not doing well or get worse.   This information is not intended to replace advice given to you by your health care provider. Make sure you discuss  any questions you have with your health care provider.   Document Released: 12/24/2004 Document Revised: 12/05/2014 Document Reviewed: 11/23/2012 Elsevier Interactive Patient Education 2016 [place].  Hyperglycemia Hyperglycemia occurs when the glucose (sugar) in your blood is too high. Hyperglycemia can happen for many reasons, but it most often happens to people who do not know they have diabetes or are not managing their diabetes properly.  CAUSES  Whether you have diabetes or not, there are other causes of hyperglycemia. Hyperglycemia can occur when you have diabetes, but it can also occur in other situations that you might not be as aware of, such as: Diabetes  If you have diabetes and are having problems controlling your blood glucose, hyperglycemia could occur because of some of the following reasons:  Not following your meal plan.  Not taking your diabetes medications or not taking it properly.  Exercising less or doing less activity than you normally do.  Being sick. Pre-diabetes  This cannot be ignored. Before people develop Type 2 diabetes, they almost always have "pre-diabetes." This is when your blood glucose levels are higher than normal, but not yet high enough to be diagnosed as diabetes. Research has shown that some long-term damage to the body, especially the heart and circulatory system, may already be occurring during pre-diabetes. If you take action to manage your blood glucose when you have pre-diabetes, you may delay or prevent Type 2 diabetes from developing. Stress  If you have diabetes, you may be "diet"  controlled or on oral medications or insulin to control your diabetes. However, you may find that your blood glucose is higher than usual in the hospital whether you have diabetes or not. This is often referred to as "stress hyperglycemia." Stress can elevate your blood glucose. This happens because of hormones put out by the body during times of stress. If  stress has been the cause of your high blood glucose, it can be followed regularly by your caregiver. That way he/she can make sure your hyperglycemia does not continue to get worse or progress to diabetes. Steroids  Steroids are medications that act on the infection fighting system (immune system) to block inflammation or infection. One side effect can be a rise in blood glucose. Most people can produce enough extra insulin to allow for this rise, but for those who cannot, steroids make blood glucose levels go even higher. It is not unusual for steroid treatments to "uncover" diabetes that is developing. It is not always possible to determine if the hyperglycemia will go away after the steroids are stopped. A special blood test called an A1c is sometimes done to determine if your blood glucose was elevated before the steroids were started. SYMPTOMS  Thirsty.  Frequent urination.  Dry mouth.  Blurred vision.  Tired or fatigue.  Weakness.  Sleepy.  Tingling in feet or leg. DIAGNOSIS  Diagnosis is made by monitoring blood glucose in one or all of the following ways:  A1c test. This is a chemical found in your blood.  Fingerstick blood glucose monitoring.  Laboratory results. TREATMENT  First, knowing the cause of the hyperglycemia is important before the hyperglycemia can be treated. Treatment may include, but is not be limited to:  Education.  Change or adjustment in medications.  Change or adjustment in meal plan.  Treatment for an illness, infection, etc.  More frequent blood glucose monitoring.  Change in exercise plan.  Decreasing or stopping steroids.  Lifestyle changes. HOME CARE INSTRUCTIONS   Test your blood glucose as directed.  Exercise regularly. Your caregiver will give you instructions about exercise. Pre-diabetes or diabetes which comes on with stress is helped by exercising.  Eat wholesome, balanced meals. Eat often and at regular, fixed times. Your  caregiver or nutritionist will give you a meal plan to guide your sugar intake.  Being at an ideal weight is important. If needed, losing as little as 10 to 15 pounds may help improve blood glucose levels. SEEK MEDICAL CARE IF:   You have questions about medicine, activity, or diet.  You continue to have symptoms (problems such as increased thirst, urination, or weight gain). SEEK IMMEDIATE MEDICAL CARE IF:   You are vomiting or have diarrhea.  Your breath smells fruity.  You are breathing faster or slower.  You are very sleepy or incoherent.  You have numbness, tingling, or pain in your feet or hands.  You have chest pain.  Your symptoms get worse even though you have been following your caregiver's orders.  If you have any other questions or concerns.   This information is not intended to replace advice given to you by your health care provider. Make sure you discuss any questions you have with your health care provider.   Document Released: 09/09/2000 Document Revised: 06/08/2011 Document Reviewed: 11/20/2014 Elsevier Interactive Patient Education [employer].

## 2015-01-12 NOTE — ED Notes (Signed)
Patient transported to CT 

## 2015-08-06 ENCOUNTER — Encounter: Payer: Self-pay | Admitting: Emergency Medicine

## 2015-08-06 ENCOUNTER — Emergency Department
Admission: EM | Admit: 2015-08-06 | Discharge: 2015-08-06 | Disposition: A | Discharge status: Home / Self Care | Payer: Self-pay | Attending: Emergency Medicine | Admitting: Emergency Medicine

## 2015-08-06 DIAGNOSIS — F1290 Cannabis use, unspecified, uncomplicated: Secondary | ICD-10-CM | POA: Insufficient documentation

## 2015-08-06 DIAGNOSIS — K859 Acute pancreatitis without necrosis or infection, unspecified: Secondary | ICD-10-CM | POA: Insufficient documentation

## 2015-08-06 DIAGNOSIS — R739 Hyperglycemia, unspecified: Secondary | ICD-10-CM

## 2015-08-06 DIAGNOSIS — Z794 Long term (current) use of insulin: Secondary | ICD-10-CM | POA: Insufficient documentation

## 2015-08-06 DIAGNOSIS — Z79899 Other long term (current) drug therapy: Secondary | ICD-10-CM | POA: Insufficient documentation

## 2015-08-06 DIAGNOSIS — E1165 Type 2 diabetes mellitus with hyperglycemia: Secondary | ICD-10-CM | POA: Insufficient documentation

## 2015-08-06 DIAGNOSIS — I10 Essential (primary) hypertension: Secondary | ICD-10-CM | POA: Insufficient documentation

## 2015-08-06 LAB — COMPREHENSIVE METABOLIC PANEL
ALK PHOS: 61 U/L (ref 38–126)
ALT: 16 U/L — ABNORMAL LOW (ref 17–63)
ANION GAP: 8 (ref 5–15)
AST: 19 U/L (ref 15–41)
Albumin: 4.1 g/dL (ref 3.5–5.0)
BUN: 32 mg/dL — ABNORMAL HIGH (ref 6–20)
CALCIUM: 9.3 mg/dL (ref 8.9–10.3)
CO2: 26 mmol/L (ref 22–32)
CREATININE: 1.74 mg/dL — AB (ref 0.61–1.24)
Chloride: 98 mmol/L — ABNORMAL LOW (ref 101–111)
GFR, EST AFRICAN AMERICAN: 47 mL/min — AB (ref >60)
GFR, EST NON AFRICAN AMERICAN: 40 mL/min — AB (ref >60)
Glucose, Bld: 560 mg/dL (ref 65–99)
Potassium: 4.7 mmol/L (ref 3.5–5.1)
SODIUM: 132 mmol/L — AB (ref 135–145)
TOTAL PROTEIN: 8.4 g/dL — AB (ref 6.5–8.1)
Total Bilirubin: 0.8 mg/dL (ref 0.3–1.2)

## 2015-08-06 LAB — GLUCOSE, CAPILLARY
GLUCOSE-CAPILLARY: 463 mg/dL — AB (ref 65–99)
GLUCOSE-CAPILLARY: 462 mg/dL — AB (ref 65–99)
Glucose-Capillary: 461 mg/dL — ABNORMAL HIGH (ref 65–99)
Glucose-Capillary: 250 mg/dL — ABNORMAL HIGH (ref 65–99)

## 2015-08-06 LAB — URINALYSIS COMPLETE WITH MICROSCOPIC (ARMC ONLY)
Bacteria, UA: NONE SEEN
Bilirubin Urine: NEGATIVE
Glucose, UA: >500 mg/dL — AB
HGB URINE DIPSTICK: NEGATIVE
LEUKOCYTES UA: NEGATIVE
NITRITE: NEGATIVE
PH: 5 (ref 5.0–8.0)
PROTEIN: NEGATIVE mg/dL
SPECIFIC GRAVITY, URINE: 1.024 (ref 1.005–1.030)

## 2015-08-06 LAB — CBC
HCT: 40.7 % (ref 40.0–52.0)
HEMOGLOBIN: 13.6 g/dL (ref 13.0–18.0)
MCH: 30.6 pg (ref 26.0–34.0)
MCHC: 33.5 g/dL (ref 32.0–36.0)
MCV: 91.4 fL (ref 80.0–100.0)
PLATELETS: 283 10*3/uL (ref 150–440)
RBC: 4.46 MIL/uL (ref 4.40–5.90)
RDW: 13.1 % (ref 11.5–14.5)
WBC: 4.9 10*3/uL (ref 3.8–10.6)

## 2015-08-06 LAB — LIPASE, BLOOD: LIPASE: 62 U/L — AB (ref 11–51)

## 2015-08-06 MED ORDER — OXYCODONE-ACETAMINOPHEN 5-325 MG PO TABS: 2.0000 | ORAL_TABLET | Freq: Four times a day (QID) | ORAL | Status: DC | PRN

## 2015-08-06 MED ORDER — INSULIN ASPART 100 UNIT/ML ~~LOC~~ SOLN
10.0000 [IU] | Freq: Once | SUBCUTANEOUS | Status: AC
Start: 2015-08-06 — End: 2015-08-06
  Administered 2015-08-06: 10 [IU] via SUBCUTANEOUS
  Filled 2015-08-06: qty 10

## 2015-08-06 MED ORDER — MORPHINE SULFATE (PF) 4 MG/ML IV SOLN
4.0000 mg | Freq: Once | INTRAVENOUS | Status: AC
  Administered 2015-08-06: 4 mg via INTRAVENOUS
  Filled 2015-08-06: qty 1

## 2015-08-06 MED ORDER — SODIUM CHLORIDE 0.9 % IV SOLN
Freq: Once | INTRAVENOUS | Status: AC
  Administered 2015-08-06: 13:00:00 via INTRAVENOUS

## 2015-08-06 MED ORDER — ONDANSETRON HCL 4 MG PO TABS: 4.0000 mg | ORAL_TABLET | Freq: Every day | ORAL | Status: DC | PRN

## 2015-08-06 MED ORDER — INSULIN ASPART 100 UNIT/ML ~~LOC~~ SOLN
10.0000 [IU] | Freq: Once | SUBCUTANEOUS | Status: AC
  Administered 2015-08-06: 10 [IU] via INTRAVENOUS
  Filled 2015-08-06: qty 10

## 2015-08-06 MED ORDER — SODIUM CHLORIDE 0.9 % IV SOLN
Freq: Once | INTRAVENOUS | Status: AC
  Administered 2015-08-06: 11:00:00 via INTRAVENOUS

## 2015-08-06 MED ORDER — METOCLOPRAMIDE HCL 5 MG/ML IJ SOLN
10.0000 mg | Freq: Once | INTRAMUSCULAR | Status: AC
  Administered 2015-08-06: 10 mg via INTRAVENOUS
  Filled 2015-08-06: qty 2

## 2015-08-06 NOTE — ED Notes (Signed)
Pt to ed with c/o abd pain and n/v x 3 days.  Pt states his cbg has been high.   States he is taking insulin as ordered.

## 2015-08-06 NOTE — Discharge Instructions (Signed)
Acute Pancreatitis Acute pancreatitis is a disease in which the pancreas becomes suddenly inflamed. The pancreas is a large gland located behind your stomach. The pancreas produces enzymes that help digest food. The pancreas also releases the hormones glucagon and insulin that help regulate blood sugar. Damage to the pancreas occurs when the digestive enzymes from the pancreas are activated and begin attacking the pancreas before being released into the intestine. Most acute attacks last a couple of days and can cause serious complications. Some people become dehydrated and develop low blood pressure. In severe cases, bleeding into the pancreas can lead to shock and can be life-threatening. The lungs, heart, and kidneys may fail. CAUSES  Pancreatitis can happen to anyone. In some cases, the cause is unknown. Most cases are caused by:  Alcohol abuse.  Gallstones. Other less common causes are:  Certain medicines.  Exposure to certain chemicals.  Infection.  Damage caused by an accident (trauma).  Abdominal surgery. SYMPTOMS   Pain in the upper abdomen that may radiate to the back.  Tenderness and swelling of the abdomen.  Nausea and vomiting. DIAGNOSIS  Your caregiver will perform a physical exam. Blood and stool tests may be done to confirm the diagnosis. Imaging tests may also be done, such as X-rays, CT scans, or an ultrasound of the abdomen. TREATMENT  Treatment usually requires a stay in the hospital. Treatment may include:  Pain medicine.  Fluid replacement through an intravenous line (IV).  Placing a tube in the stomach to remove stomach contents and control vomiting.  Not eating for 3 or 4 days. This gives your pancreas a rest, because enzymes are not being produced that can cause further damage.  Antibiotic medicines if your condition is caused by an infection.  Surgery of the pancreas or gallbladder. HOME CARE INSTRUCTIONS   Follow the diet advised by your  caregiver. This may involve avoiding alcohol and decreasing the amount of fat in your diet.  Eat smaller, more frequent meals. This reduces the amount of digestive juices the pancreas produces.  Drink enough fluids to keep your urine clear or pale yellow.  Only take over-the-counter or prescription medicines as directed by your caregiver.  Avoid drinking alcohol if it caused your condition.  Do not smoke.  Get plenty of rest.  Check your blood sugar at home as directed by your caregiver.  Keep all follow-up appointments as directed by your caregiver. SEEK MEDICAL CARE IF:   You do not recover as quickly as expected.  You develop new or worsening symptoms.  You have persistent pain, weakness, or nausea.  You recover and then have another episode of pain. SEEK IMMEDIATE MEDICAL CARE IF:   You are unable to eat or keep fluids down.  Your pain becomes severe.  You have a fever or persistent symptoms for more than 2 to 3 days.  You have a fever and your symptoms suddenly get worse.  Your skin or the white part of your eyes turn yellow (jaundice).  You develop vomiting.  You feel dizzy, or you faint.  Your blood sugar is high (over 300 mg/dL). MAKE SURE YOU:   Understand these instructions.  Will watch your condition.  Will get help right away if you are not doing well or get worse.   This information is not intended to replace advice given to you by your health care provider. Make sure you discuss any questions you have with your health care provider.   Document Released: 03/16/2005 Document Revised: 09/15/2011  Document Reviewed: 06/25/2011 Elsevier Interactive Patient Education 2016 [place].  Hyperglycemia Hyperglycemia occurs when the glucose (sugar) in your blood is too high. Hyperglycemia can happen for many reasons, but it most often happens to people who do not know they have diabetes or are not managing their diabetes properly.  CAUSES  Whether you  have diabetes or not, there are other causes of hyperglycemia. Hyperglycemia can occur when you have diabetes, but it can also occur in other situations that you might not be as aware of, such as: Diabetes  If you have diabetes and are having problems controlling your blood glucose, hyperglycemia could occur because of some of the following reasons:  Not following your meal plan.  Not taking your diabetes medications or not taking it properly.  Exercising less or doing less activity than you normally do.  Being sick. Pre-diabetes  This cannot be ignored. Before people develop Type 2 diabetes, they almost always have "pre-diabetes." This is when your blood glucose levels are higher than normal, but not yet high enough to be diagnosed as diabetes. Research has shown that some long-term damage to the body, especially the heart and circulatory system, may already be occurring during pre-diabetes. If you take action to manage your blood glucose when you have pre-diabetes, you may delay or prevent Type 2 diabetes from developing. Stress  If you have diabetes, you may be "diet" controlled or on oral medications or insulin to control your diabetes. However, you may find that your blood glucose is higher than usual in the hospital whether you have diabetes or not. This is often referred to as "stress hyperglycemia." Stress can elevate your blood glucose. This happens because of hormones put out by the body during times of stress. If stress has been the cause of your high blood glucose, it can be followed regularly by your caregiver. That way he/she can make sure your hyperglycemia does not continue to get worse or progress to diabetes. Steroids  Steroids are medications that act on the infection fighting system (immune system) to block inflammation or infection. One side effect can be a rise in blood glucose. Most people can produce enough extra insulin to allow for this rise, but for those who cannot,  steroids make blood glucose levels go even higher. It is not unusual for steroid treatments to "uncover" diabetes that is developing. It is not always possible to determine if the hyperglycemia will go away after the steroids are stopped. A special blood test called an A1c is sometimes done to determine if your blood glucose was elevated before the steroids were started. SYMPTOMS  Thirsty.  Frequent urination.  Dry mouth.  Blurred vision.  Tired or fatigue.  Weakness.  Sleepy.  Tingling in feet or leg. DIAGNOSIS  Diagnosis is made by monitoring blood glucose in one or all of the following ways:  A1c test. This is a chemical found in your blood.  Fingerstick blood glucose monitoring.  Laboratory results. TREATMENT  First, knowing the cause of the hyperglycemia is important before the hyperglycemia can be treated. Treatment may include, but is not be limited to:  Education.  Change or adjustment in medications.  Change or adjustment in meal plan.  Treatment for an illness, infection, etc.  More frequent blood glucose monitoring.  Change in exercise plan.  Decreasing or stopping steroids.  Lifestyle changes. HOME CARE INSTRUCTIONS   Test your blood glucose as directed.  Exercise regularly. Your caregiver will give you instructions about exercise. Pre-diabetes or diabetes  which comes on with stress is helped by exercising.  Eat wholesome, balanced meals. Eat often and at regular, fixed times. Your caregiver or nutritionist will give you a meal plan to guide your sugar intake.  Being at an ideal weight is important. If needed, losing as little as 10 to 15 pounds may help improve blood glucose levels. SEEK MEDICAL CARE IF:   You have questions about medicine, activity, or diet.  You continue to have symptoms (problems such as increased thirst, urination, or weight gain). SEEK IMMEDIATE MEDICAL CARE IF:   You are vomiting or have diarrhea.  Your breath smells  fruity.  You are breathing faster or slower.  You are very sleepy or incoherent.  You have numbness, tingling, or pain in your feet or hands.  You have chest pain.  Your symptoms get worse even though you have been following your caregiver's orders.  If you have any other questions or concerns.   This information is not intended to replace advice given to you by your health care provider. Make sure you discuss any questions you have with your health care provider.   Document Released: 09/09/2000 Document Revised: 06/08/2011 Document Reviewed: 11/20/2014 Elsevier Interactive Patient Education [employer].

## 2015-08-06 NOTE — ED Provider Notes (Signed)
[facility] Emergency Department Provider Note        Time seen: ----------------------------------------- 10:40 AM on 08/06/2015 -----------------------------------------    I have reviewed the triage vital signs and the nursing notes.   HISTORY  Chief Complaint Abdominal Pain    HPI Nicholas Weiss is a 65 y.o. male who presents ER with abdominal pain that is generalized as well as nausea vomiting for 3 days. Patient states his blood sugars been elevated but his been taking his insulin as prescribed. Denies alcohol use, denies fevers chills or other complaints. Main complaint is just persistent abdominal pain and vomiting   Past Medical History  Diagnosis Date  . Diabetes mellitus without complication ([place])   . Hypertension   . Hypercholesteremia     There are no active problems to display for this patient.   Past Surgical History  Procedure Laterality Date  . Knee surgery    . Knee surgery Left     Allergies Penicillins  Social History Social History  Substance Use Topics  . Smoking status: Never Smoker   . Smokeless tobacco: Never Used  . Alcohol Use: 0.6 oz/week    1 Cans of beer per week     Comment: weekly    Review of Systems Constitutional: Negative for fever. Eyes: Negative for visual changes. ENT: Negative for sore throat. Cardiovascular: Negative for chest pain. Respiratory: Negative for shortness of breath. Gastrointestinal: Positive for abdominal pain and vomiting Genitourinary: Negative for dysuria. Musculoskeletal: Negative for back pain. Skin: Negative for rash. Neurological: Negative for headaches, focal weakness or numbness.  10-point ROS otherwise negative.  ____________________________________________   PHYSICAL EXAM:  VITAL SIGNS: ED Triage Vitals  Enc Vitals Group     BP 08/06/15 0929 140/93 mmHg     Pulse Rate 08/06/15 0929 102     Resp 08/06/15 0929 18     Temp 08/06/15 0929 98.1 F (36.7  C)     Temp Source 08/06/15 0929 Oral     SpO2 08/06/15 0929 100 %     Weight 08/06/15 0929 150 lb (68.04 kg)     Height 08/06/15 0929 5\' 10"  (1.778 m)     Head Cir --      Peak Flow --      Pain Score 08/06/15 0929 8     Pain Loc --      Pain Edu? --      Excl. in [place]? --     Constitutional: Alert and oriented. Mild distress Eyes: Conjunctivae are normal. PERRL. Normal extraocular movements. ENT   Head: Normocephalic and atraumatic.   Nose: No congestion/rhinnorhea.   Mouth/Throat: Mucous membranes are moist.   Neck: No stridor. Cardiovascular: Normal rate, regular rhythm. No murmurs, rubs, or gallops. Respiratory: Normal respiratory effort without tachypnea nor retractions. Breath sounds are clear and equal bilaterally. No wheezes/rales/rhonchi. Gastrointestinal: Soft and nontender. Normal bowel sounds Musculoskeletal: Nontender with normal range of motion in all extremities. No lower extremity tenderness nor edema. Neurologic:  Normal speech and language. No gross focal neurologic deficits are appreciated.  Skin:  Skin is warm, dry and intact. No rash noted. Psychiatric: Mood and affect are normal. Speech and behavior are normal.  ____________________________________________  ED COURSE:  Pertinent labs & imaging results that were available during my care of the patient were reviewed by me and considered in my medical decision making (see chart for details). Patient presents to ER with abdominal pain and vomiting, he'll receive IV fluids, pain medicine and antiemetics.  We will assess for DKA. ____________________________________________    LABS (pertinent positives/negatives)  Labs Reviewed  GLUCOSE, CAPILLARY - Abnormal; Notable for the following:    Glucose-Capillary 461 (*)    All other components within normal limits  GLUCOSE, CAPILLARY - Abnormal; Notable for the following:    Glucose-Capillary 463 (*)    All other components within normal limits   LIPASE, BLOOD - Abnormal; Notable for the following:    Lipase 62 (*)    All other components within normal limits  COMPREHENSIVE METABOLIC PANEL - Abnormal; Notable for the following:    Sodium 132 (*)    Chloride 98 (*)    Glucose, Bld 560 (*)    BUN 32 (*)    Creatinine, Ser 1.74 (*)    Total Protein 8.4 (*)    ALT 16 (*)    GFR calc non Af Amer 40 (*)    GFR calc Af Amer 47 (*)    All other components within normal limits  URINALYSIS COMPLETEWITH MICROSCOPIC ([facility] ONLY) - Abnormal; Notable for the following:    Color, Urine STRAW (*)    APPearance CLEAR (*)    Glucose, UA >500 (*)    Ketones, ur 1+ (*)    Squamous Epithelial / LPF 0-5 (*)    All other components within normal limits  GLUCOSE, CAPILLARY - Abnormal; Notable for the following:    Glucose-Capillary 462 (*)    All other components within normal limits  CBC  CBG MONITORING, ED   ____________________________________________  FINAL ASSESSMENT AND PLAN  Hyperglycemia, possible gastroparesis, mild pancreatitis  Plan: Patient with labs as dictated above. Patient presented to the ER with abdominal pain which is likely secondary to pancreatitis and/or gastroparesis from hypoglycemia. His blood sugars have continued to trend down here. I will advise strict insulin use and close outpatient follow-up with his doctor. He is able to keep liquids down here before leaving    Earleen Newport, MD   Note: This dictation was prepared with Dragon dictation. Any transcriptional errors that result from this process are unintentional   Earleen Newport, MD 08/06/15 1242

## 2016-01-16 ENCOUNTER — Emergency Department
Admission: EM | Admit: 2016-01-16 | Discharge: 2016-01-16 | Disposition: A | Discharge status: Home / Self Care | Payer: Self-pay | Attending: Emergency Medicine | Admitting: Emergency Medicine

## 2016-01-16 ENCOUNTER — Encounter: Payer: Self-pay | Admitting: *Deleted

## 2016-01-16 DIAGNOSIS — Y929 Unspecified place or not applicable: Secondary | ICD-10-CM | POA: Insufficient documentation

## 2016-01-16 DIAGNOSIS — Z79899 Other long term (current) drug therapy: Secondary | ICD-10-CM | POA: Insufficient documentation

## 2016-01-16 DIAGNOSIS — X58XXXA Exposure to other specified factors, initial encounter: Secondary | ICD-10-CM | POA: Insufficient documentation

## 2016-01-16 DIAGNOSIS — I10 Essential (primary) hypertension: Secondary | ICD-10-CM | POA: Insufficient documentation

## 2016-01-16 DIAGNOSIS — Y999 Unspecified external cause status: Secondary | ICD-10-CM | POA: Insufficient documentation

## 2016-01-16 DIAGNOSIS — E1165 Type 2 diabetes mellitus with hyperglycemia: Secondary | ICD-10-CM | POA: Insufficient documentation

## 2016-01-16 DIAGNOSIS — R739 Hyperglycemia, unspecified: Secondary | ICD-10-CM

## 2016-01-16 DIAGNOSIS — Y939 Activity, unspecified: Secondary | ICD-10-CM | POA: Insufficient documentation

## 2016-01-16 DIAGNOSIS — T148XXA Other injury of unspecified body region, initial encounter: Secondary | ICD-10-CM

## 2016-01-16 DIAGNOSIS — S90822A Blister (nonthermal), left foot, initial encounter: Secondary | ICD-10-CM | POA: Insufficient documentation

## 2016-01-16 DIAGNOSIS — Z794 Long term (current) use of insulin: Secondary | ICD-10-CM | POA: Insufficient documentation

## 2016-01-16 LAB — GLUCOSE, CAPILLARY: Glucose-Capillary: 386 mg/dL — ABNORMAL HIGH (ref 65–99)

## 2016-01-16 MED ORDER — MUPIROCIN 2 % EX OINT: TOPICAL_OINTMENT | CUTANEOUS | 0 refills | Status: DC

## 2016-01-16 NOTE — ED Notes (Signed)
Discharge instructions reviewed with patient. Patient verbalized understanding. Patient ambulated to lobby without difficulty.   

## 2016-01-16 NOTE — ED Provider Notes (Signed)
[facility] Emergency Department Provider Note        Time seen: ----------------------------------------- 9:53 PM on 01/16/2016 -----------------------------------------    I have reviewed the triage vital signs and the nursing notes.   HISTORY  Chief Complaint Insect Bite    HPI Nicholas Weiss is a 65 y.o. male who presents to the ER for a blisteringarea to his left ankle. Patient has some swelling in his ankle throughout the day, and thinks he may been bitten by an insect. Patient states he woke up in the night rubbing it with his other leg. In the morning it was swollen, he was concerned it was a severe insect bite. He also reports she has not had his insulin tonight, blood sugarn  is 386   Past Medical History:  Diagnosis Date  . Diabetes mellitus without complication ([place])   . Hypercholesteremia   . Hypertension     There are no active problems to display for this patient.   Past Surgical History:  Procedure Laterality Date  . KNEE SURGERY    . KNEE SURGERY Left     Allergies Penicillins  Social History Social History  Substance Use Topics  . Smoking status: Never Smoker  . Smokeless tobacco: Never Used  . Alcohol use 0.6 oz/week    1 Cans of beer per week     Comment: weekly    Review of Systems Constitutional: Negative for fever. Skin:Positive for blister Neurological: Negative for headaches, focal weakness or numbness.  ____________________________________________   PHYSICAL EXAM:  VITAL SIGNS: ED Triage Vitals  Enc Vitals Group     BP 01/16/16 2127 110/87     Pulse Rate 01/16/16 2127 (!) 106     Resp 01/16/16 2127 16     Temp 01/16/16 2127 98.2 F (36.8 C)     Temp Source 01/16/16 2127 Oral     SpO2 01/16/16 2127 100 %     Weight 01/16/16 2128 157 lb (71.2 kg)     Height 01/16/16 2128 5\' 10"  (1.778 m)     Head Circumference --      Peak Flow --      Pain Score 01/16/16 2128 6     Pain Loc --      Pain  Edu? --      Excl. in [place]? --     Constitutional: Alert and oriented. Well appearing and in no distress. Musculoskeletal: Nontender with normal range of motion in all extremities. 2 cm fluid-filled blister is noted over the left foot medially, no surrounding erythema or cellulitis Neurologic:  Normal speech and language. No gross focal neurologic deficits are appreciated.  Skin:  Left foot blister as noted above Psychiatric: Mood and affect are normal. Speech and behavior are normal.  ___________________________________________  ED COURSE:  Pertinent labs & imaging results that were available during my care of the patient were reviewed by me and considered in my medical decision making (see chart for details). Clinical Course  Patient presents to ER for a blister on his left foot. No specific treatment is required.  Procedures ____________________________________________   LABS (pertinent positives/negatives)  Labs Reviewed  GLUCOSE, CAPILLARY - Abnormal; Notable for the following:       Result Value   Glucose-Capillary 386 (*)    All other components within normal limits  ____________________________________________  FINAL ASSESSMENT AND PLAN  Blister, hyperglycemia  Plan: Patient  a small left foot blister. I will prescribe Bactroban to use as needed. Advised he take  his home insulin as prescribed.   Earleen Newport, MD   Note: This dictation was prepared with Dragon dictation. Any transcriptional errors that result from this process are unintentional    Earleen Newport, MD 01/16/16 2155

## 2016-01-16 NOTE — ED Triage Notes (Signed)
Pt says he woke up this morning and he had a blistering/itchy area to the left ankle. Has had some swelling in his ankle through the day, thinks he may have been bit by an insect. Has been applying peroxide to the area.

## 2016-01-16 NOTE — ED Triage Notes (Signed)
CBG 386, pt states he has not had his insulin tonight

## 2016-01-24 ENCOUNTER — Ambulatory Visit (INDEPENDENT_AMBULATORY_CARE_PROVIDER_SITE_OTHER): Payer: Self-pay | Admitting: Family Medicine

## 2016-01-24 ENCOUNTER — Encounter: Payer: Self-pay | Admitting: Family Medicine

## 2016-01-24 VITALS — BP 110/64 | HR 92 | Temp 97.8°F | Resp 16 | Ht 70.0 in | Wt 149.0 lb

## 2016-01-24 DIAGNOSIS — E1140 Type 2 diabetes mellitus with diabetic neuropathy, unspecified: Secondary | ICD-10-CM

## 2016-01-24 DIAGNOSIS — E785 Hyperlipidemia, unspecified: Secondary | ICD-10-CM | POA: Insufficient documentation

## 2016-01-24 DIAGNOSIS — E7800 Pure hypercholesterolemia, unspecified: Secondary | ICD-10-CM | POA: Insufficient documentation

## 2016-01-24 DIAGNOSIS — I10 Essential (primary) hypertension: Secondary | ICD-10-CM

## 2016-01-24 DIAGNOSIS — E1165 Type 2 diabetes mellitus with hyperglycemia: Secondary | ICD-10-CM

## 2016-01-24 MED ORDER — INSULIN DEGLUDEC 200 UNIT/ML ~~LOC~~ SOPN: 40.0000 [IU] | PEN_INJECTOR | Freq: Every day | SUBCUTANEOUS | 0 refills | Status: DC

## 2016-01-24 NOTE — Progress Notes (Signed)
Patient: Nicholas Weiss, Male    DOB: [birth year], 65 y.o.   MRN: [id number] Visit Date: 01/24/2016  Today's Provider: Lelon Huh, MD   Chief Complaint  Patient presents with  . Annual Exam  . Diabetes    follow up  . Hyperlipidemia    follow up  . Hypertension    follow up   Subjective:    Annual physical exam Nicholas Weiss is a 65 y.o. male who presents today for health maintenance and complete physical. He feels poorly. He reports exercising daily . He reports he is sleeping fairly well. Patient was recently [facility] at [facility] 01/16/2016 for blister of his foot and hyperglycemia. Patient was given a prescription for Bactroban cream to apply and reports it has stopped draining and is much less swollen.  -----------------------------------------------------------------  Diabetes Mellitus Type II, Follow-up:   No results found for: HGBA1C Last seen for diabetes 2 years ago.  Management since then includes increasing Lantus to 48 units daily. He reports poor compliance with treatment. He is having side effects.  Current symptoms include hyperglycemia, paresthesia of the feet, polydipsia and visual disturbances and have been worsening. Home blood sugar records: fasting range: 400's  Episodes of hypoglycemia? no   Current Insulin Regimen: Humalog sliding scale. Had been on 40 units Lantus daily but has been out for about a month due to expense of medication.  Most Recent Eye Exam: >1 year ago Weight trend: decreasing steadily Prior visit with dietician: no Current diet: in general, an "unhealthy" diet Current exercise: walking  ------------------------------------------------------------------------   Hypertension, follow-up:  BP Readings from Last 3 Encounters:  09/08/13 126/88  01/16/16 110/87  08/06/15 (!) 145/93    He was last seen for hypertension 2 years ago.  BP at that visit was 126/88. Management since that visit includes no changes.He  reports good compliance with treatment. He is not having side effects.  He is exercising. He is adherent to low salt diet.   Outside blood pressures are not being checked. He is experiencing fatigue.  Patient denies chest pain, chest pressure/discomfort, claudication, dyspnea, exertional chest pressure/discomfort, irregular heart beat, lower extremity edema, near-syncope, orthopnea, palpitations, paroxysmal nocturnal dyspnea, syncope and tachypnea.   Cardiovascular risk factors include diabetes mellitus, dyslipidemia, hypertension and male gender.  Use of agents associated with hypertension: none.   ------------------------------------------------------------------------    Lipid/Cholesterol, Follow-up:   Last seen for this 2 years ago. Labs were ordered but not completed by the patient.  Management since that visit includes no changes.  Last Lipid Panel: No results found for: CHOL, TRIG, HDL, CHOLHDL, VLDL, LDLCALC, LDLDIRECT  He reports fair compliance with treatment. He is not having side effects.   Wt Readings from Last 3 Encounters:  09/08/13 164 lb (74.4 kg)  01/16/16 157 lb (71.2 kg)  08/06/15 150 lb (68 kg)    ------------------------------------------------------------------------   Review of Systems  Constitutional: Positive for appetite change, fatigue and unexpected weight change. Negative for chills and fever.  HENT: Positive for dental problem, nosebleeds and rhinorrhea. Negative for congestion, ear pain, hearing loss and trouble swallowing.   Eyes: Negative for pain and visual disturbance.  Respiratory: Positive for shortness of breath. Negative for cough and chest tightness.   Cardiovascular: Negative for chest pain, palpitations and leg swelling.  Gastrointestinal: Positive for abdominal pain. Negative for blood in stool, constipation, diarrhea, nausea and vomiting.  Endocrine: Positive for cold intolerance, polydipsia and polyuria. Negative for polyphagia.  Genitourinary: Negative for dysuria and flank pain.  Musculoskeletal: Positive for back pain. Negative for arthralgias, joint swelling, myalgias and neck stiffness.  Skin: Positive for wound. Negative for color change and rash.  Neurological: Positive for weakness and headaches. Negative for dizziness, tremors, seizures, speech difficulty and light-headedness.  Psychiatric/Behavioral: Negative for behavioral problems, confusion, decreased concentration, dysphoric mood and sleep disturbance. The patient is not nervous/anxious.   All other systems reviewed and are negative.   Social History      He  reports that he has never smoked. He has never used smokeless tobacco. He reports that he drinks about 0.6 oz of alcohol per week . He reports that he uses drugs, including Marijuana.       Social History   Social History  . Marital status: Single    Spouse name: N/A  . Number of children: 3  . Years of education: N/A   Occupational History  . Unemployed     gets Control and instrumentation engineer   Social History Main Topics  . Smoking status: Never Smoker  . Smokeless tobacco: Never Used  . Alcohol use 0.6 oz/week    1 Cans of beer per week     Comment: weekly  . Drug use:     Types: Marijuana     Comment: Last used 01/08/15  . Sexual activity: Yes    Birth control/ protection: None   Other Topics Concern  . None   Social History Narrative  . None    Past Medical History:  Diagnosis Date  . Diabetes mellitus without complication ([place])   . Hypercholesteremia   . Hypertension      Patient Active Problem List   Diagnosis Date Noted  . Hypercholesterolemia 01/24/2016  . Essential (primary) hypertension 06/07/2006  . Diabetes mellitus type 2, uncontrolled ([place]) 05/31/2006    Past Surgical History:  Procedure Laterality Date  . KNEE SURGERY Right    Torn meniscus  . KNEE SURGERY Left     Family History        Family Status  Relation Status  . Mother Deceased at age 71    complications of blocked Vascular veins   . Father Deceased at age 59   MI  . Sister Alive  . Brother Alive  . Sister Alive  . Sister Alive  . Sister Alive        His family history includes Heart attack in his father; Hypertension in his sister.    Allergies  Allergen Reactions  . Penicillins Other (See Comments)    Syncope  Has patient had a PCN reaction causing immediate rash, facial/tongue/throat swelling, SOB or lightheadedness with hypotension: No Has patient had a PCN reaction causing severe rash involving mucus membranes or skin necrosis: No Has patient had a PCN reaction that required hospitalization No Has patient had a PCN reaction occurring within the last 10 years: No If all of the above answers are "NO", then may proceed with Cephalosporin use.     Current Meds  Medication Sig  . hydrochlorothiazide (HYDRODIURIL) 50 MG tablet Take 1 tablet by mouth daily.  . insulin glargine (LANTUS) 100 UNIT/ML injection Inject 20 Units into the skin daily.   . insulin lispro (HUMALOG) 100 UNIT/ML injection Inject 10 Units into the skin 3 (three) times daily before meals. Per sliding scale  . lovastatin (MEVACOR) 40 MG tablet Take 1 tablet by mouth daily.  . mupirocin ointment (BACTROBAN) 2 % Apply to affected area 3 times daily  .  ondansetron (ZOFRAN) 4 MG tablet Take 1 tablet (4 mg total) by mouth daily as needed for nausea or vomiting.  Marland Kitchen oxyCODONE-acetaminophen (PERCOCET) 5-325 MG tablet Take 2 tablets by mouth every 6 (six) hours as needed for moderate pain or severe pain.    Patient Care Team: Birdie Sons, MD as PCP - General (Family Medicine)     Objective:   Vitals: BP 110/64 (BP Location: Left Arm, Patient Position: Sitting, Cuff Size: Normal)   Pulse 92   Temp 97.8 F (36.6 C) (Oral)   Resp 16   Ht 5\' 10"  (1.778 m)   Wt 149 lb (67.6 kg)   SpO2 100% Comment: room air  BMI 21.38 kg/m    Physical Exam   General Appearance:    Alert, cooperative, no  distress  Eyes:    PERRL, conjunctiva/corneas clear, EOM's intact       Lungs:     Clear to auscultation bilaterally, respirations unlabored  Heart:    Regular rate and rhythm  Neurologic:   Awake, alert, oriented x 3. No apparent focal neurological           defect.   Skin : About 4cm round denuded blister side of heel with no surrounding erythema and no drainage.     Depression Screen No flowsheet data found.    Assessment & Plan:    1. Essential (primary) hypertension   2. Uncontrolled type 2 diabetes mellitus with diabetic neuropathy, unspecified long term insulin use status ([place]) Uncontrolled due to expense of Lantus insulin. Given samples of Tresiba 200/ml to take 40 units daily and continue humalog sliding scale. Advised to increase water consumption until his sugars normalize and follow up in 1 month for labs.   3. Blister heel Does not appear infected at this time. Advised to keep covered when ambulating or wearing socks, and to continue bactroban until healed.   Lelon Huh, MD  [facility]

## 2016-02-24 ENCOUNTER — Ambulatory Visit: Payer: Self-pay | Admitting: Family Medicine

## 2016-03-25 ENCOUNTER — Encounter: Payer: Self-pay | Admitting: Emergency Medicine

## 2016-03-25 ENCOUNTER — Emergency Department: Payer: Self-pay

## 2016-03-25 ENCOUNTER — Emergency Department
Admission: EM | Admit: 2016-03-25 | Discharge: 2016-03-25 | Disposition: A | Discharge status: Home / Self Care | Payer: Self-pay | Attending: Emergency Medicine | Admitting: Emergency Medicine

## 2016-03-25 DIAGNOSIS — Z794 Long term (current) use of insulin: Secondary | ICD-10-CM | POA: Insufficient documentation

## 2016-03-25 DIAGNOSIS — R4182 Altered mental status, unspecified: Secondary | ICD-10-CM | POA: Insufficient documentation

## 2016-03-25 DIAGNOSIS — Z79899 Other long term (current) drug therapy: Secondary | ICD-10-CM | POA: Insufficient documentation

## 2016-03-25 DIAGNOSIS — E119 Type 2 diabetes mellitus without complications: Secondary | ICD-10-CM | POA: Insufficient documentation

## 2016-03-25 DIAGNOSIS — I10 Essential (primary) hypertension: Secondary | ICD-10-CM | POA: Insufficient documentation

## 2016-03-25 LAB — COMPREHENSIVE METABOLIC PANEL
ALT: 9 U/L — AB (ref 17–63)
AST: 18 U/L (ref 15–41)
Albumin: 3.1 g/dL — ABNORMAL LOW (ref 3.5–5.0)
Alkaline Phosphatase: 50 U/L (ref 38–126)
Anion gap: 5 (ref 5–15)
BUN: 18 mg/dL (ref 6–20)
CHLORIDE: 106 mmol/L (ref 101–111)
CO2: 24 mmol/L (ref 22–32)
CREATININE: 1.36 mg/dL — AB (ref 0.61–1.24)
Calcium: 8.3 mg/dL — ABNORMAL LOW (ref 8.9–10.3)
GFR calc non Af Amer: 54 mL/min — ABNORMAL LOW (ref >60)
Glucose, Bld: 316 mg/dL — ABNORMAL HIGH (ref 65–99)
POTASSIUM: 4.4 mmol/L (ref 3.5–5.1)
SODIUM: 135 mmol/L (ref 135–145)
Total Bilirubin: 0.4 mg/dL (ref 0.3–1.2)
Total Protein: 6.5 g/dL (ref 6.5–8.1)

## 2016-03-25 LAB — URINE DRUG SCREEN, QUALITATIVE (ARMC ONLY)
AMPHETAMINES, UR SCREEN: NOT DETECTED
BARBITURATES, UR SCREEN: NOT DETECTED
Benzodiazepine, Ur Scrn: NOT DETECTED
COCAINE METABOLITE, UR ~~LOC~~: POSITIVE — AB
Cannabinoid 50 Ng, Ur ~~LOC~~: POSITIVE — AB
MDMA (ECSTASY) UR SCREEN: NOT DETECTED
METHADONE SCREEN, URINE: NOT DETECTED
OPIATE, UR SCREEN: NOT DETECTED
Phencyclidine (PCP) Ur S: NOT DETECTED
TRICYCLIC, UR SCREEN: POSITIVE — AB

## 2016-03-25 LAB — ETHANOL: Alcohol, Ethyl (B): <5 mg/dL (ref <5)

## 2016-03-25 LAB — CBC
HCT: 34.1 % — ABNORMAL LOW (ref 40.0–52.0)
Hemoglobin: 11.6 g/dL — ABNORMAL LOW (ref 13.0–18.0)
MCH: 31.2 pg (ref 26.0–34.0)
MCHC: 33.9 g/dL (ref 32.0–36.0)
MCV: 92.1 fL (ref 80.0–100.0)
PLATELETS: 211 10*3/uL (ref 150–440)
RBC: 3.7 MIL/uL — AB (ref 4.40–5.90)
RDW: 13.9 % (ref 11.5–14.5)
WBC: 4.7 10*3/uL (ref 3.8–10.6)

## 2016-03-25 LAB — SALICYLATE LEVEL

## 2016-03-25 LAB — ACETAMINOPHEN LEVEL

## 2016-03-25 IMAGING — CT CT HEAD W/O CM
3 series · 15 of 46 positions shown, 18 images · non-contrast
Comparison: CT [DATE]

CLINICAL DATA: Altered mental status

EXAM:
CT HEAD WITHOUT CONTRAST
TECHNIQUE: Contiguous axial images were obtained from the base of the skull
through the vertex without intravenous contrast.

[Series 2: head wo · axial · 0.42mm/px · z∈[-65,+55]mm · 9 of 29 slices shown, 12 images]
[im 3/29  brain]
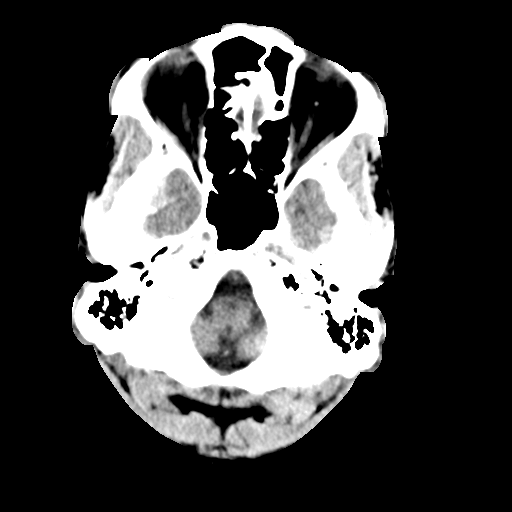
[im 3/29  bone]
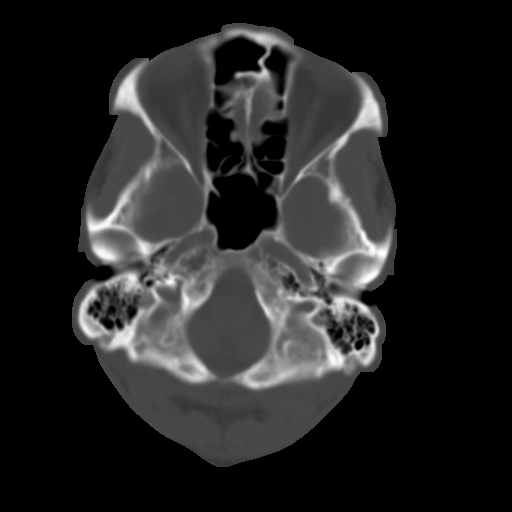
[im 6/29  brain]
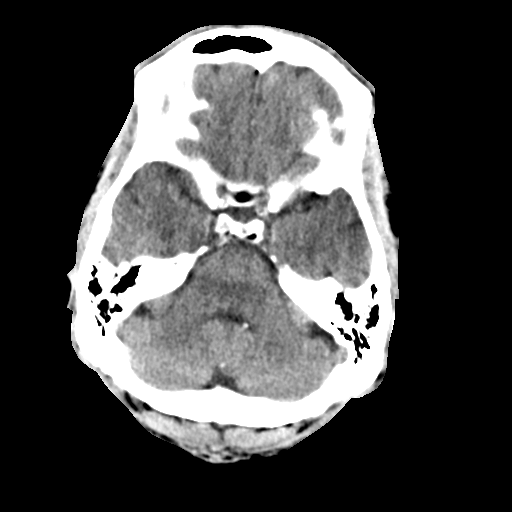
[im 9/29  brain]
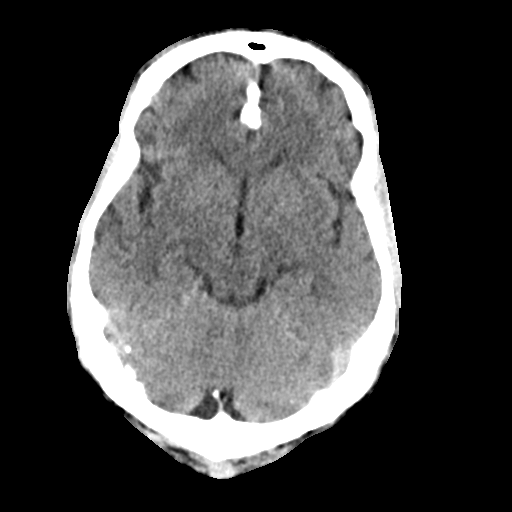
[im 12/29  brain]
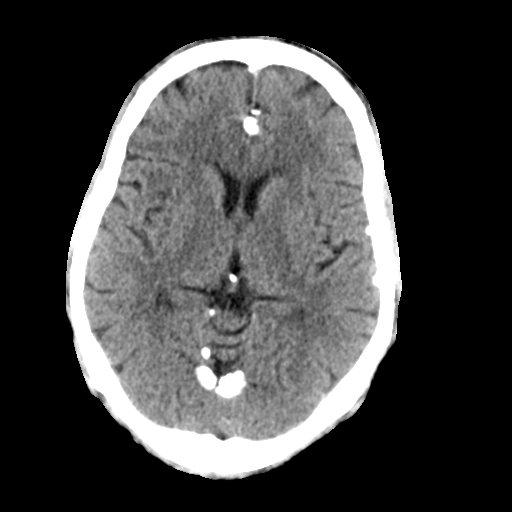
[im 15/29  brain]
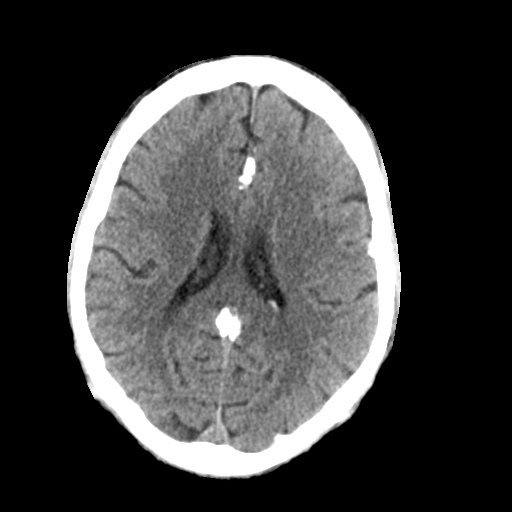
[im 15/29  bone]
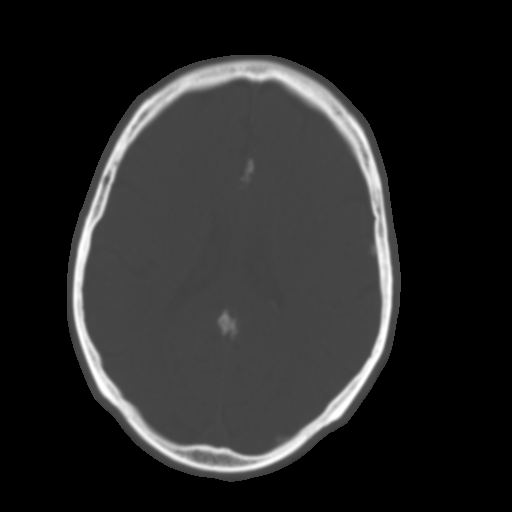
[im 18/29  brain]
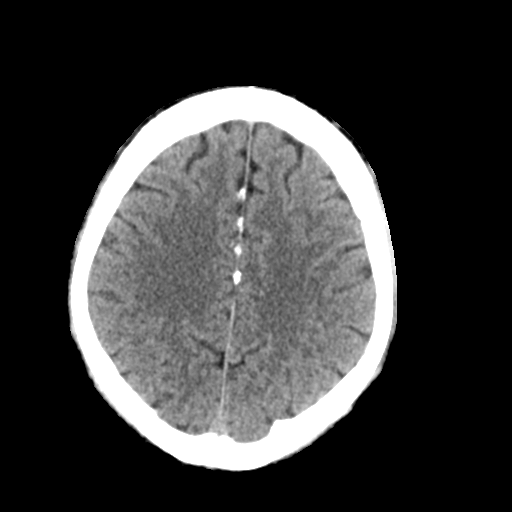
[im 21/29  brain]
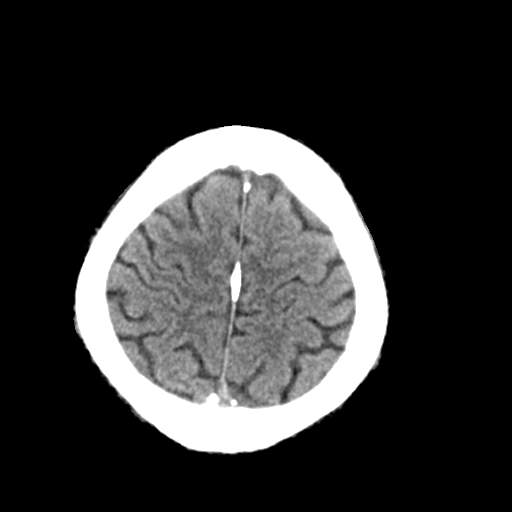
[im 24/29  brain]
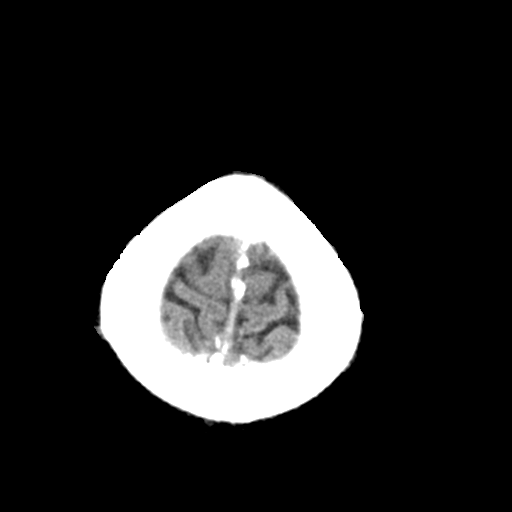
[im 27/29  brain]
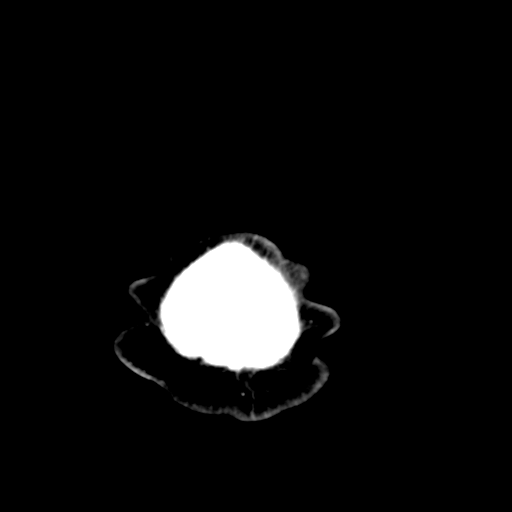
[im 27/29  bone]
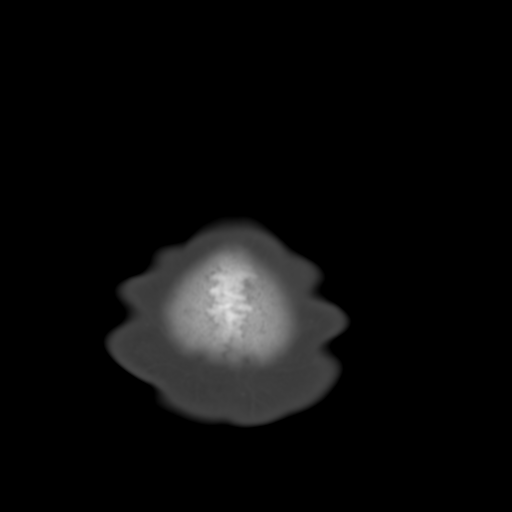

[Series 4: coronal soft tissue · coronal · 0.32mm/px · 3 of 71 slices shown]
[im 24/71  brain]
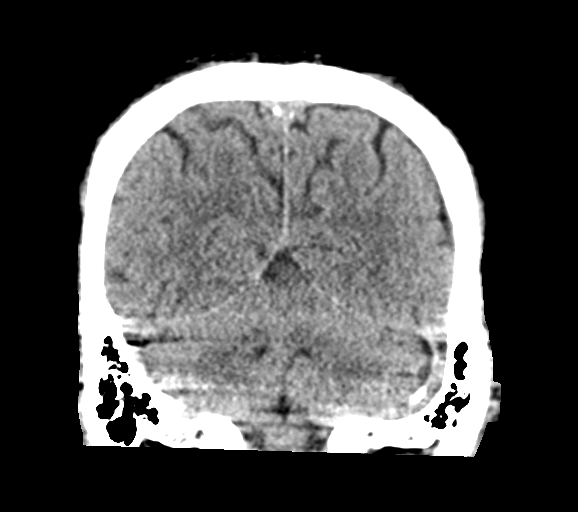
[im 32/71  brain]
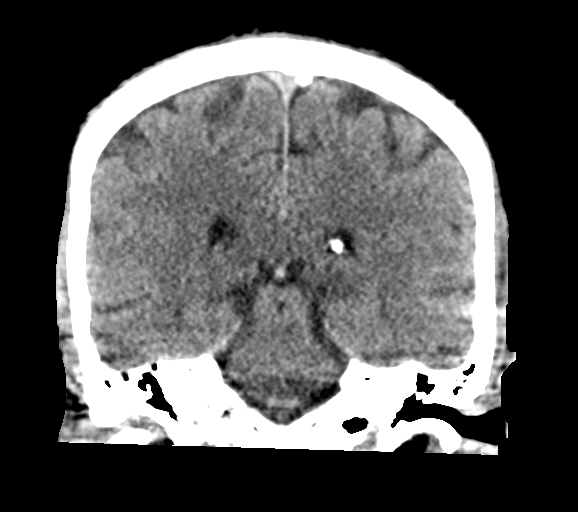
[im 39/71  brain]
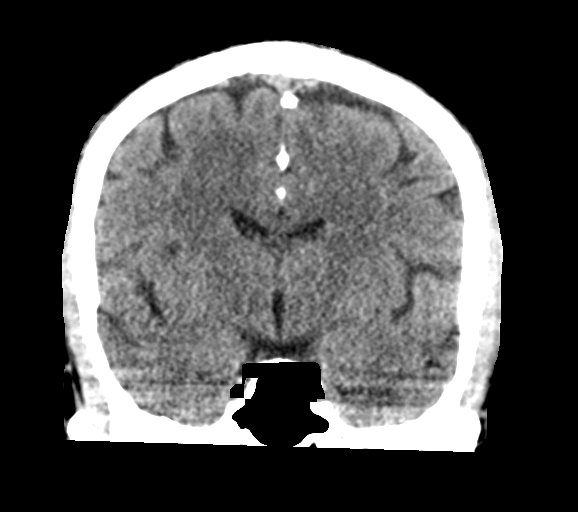

[Series 5: sagittal soft tissue · sagittal · 0.31mm/px · 3 of 60 slices shown]
[im 20/60  brain]
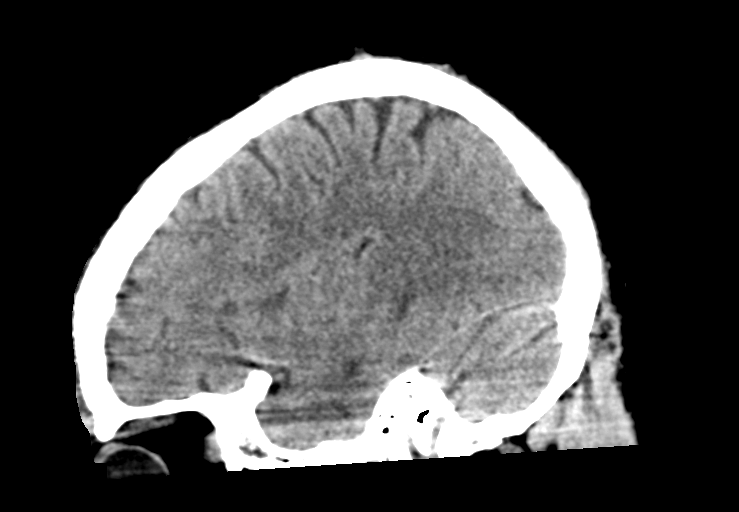
[im 30/60  brain]
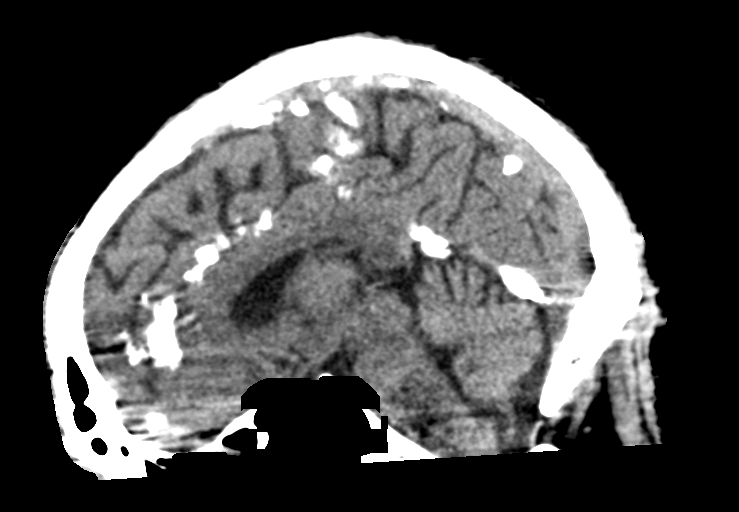
[im 40/60  brain]
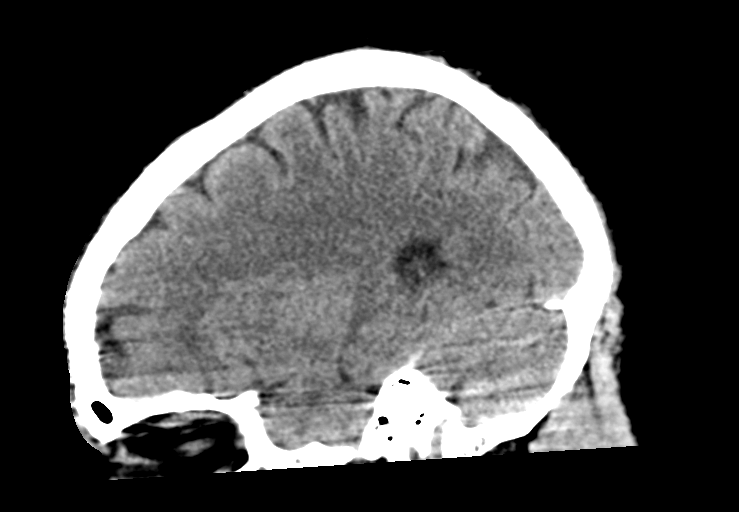

[15 of 46 positions shown; findings below may reference images not displayed]

FINDINGS: Brain: Ventricle size and cerebral volume normal. Negative for acute
or chronic infarction. Negative for intracranial hemorrhage. No mass
or edema. Incidental note is made of extensive dural calcification.

Vascular: No hyperdense vessel or unexpected calcification.

Skull: Negative

Sinuses/Orbits: Mild mucosal edema in the paranasal sinuses. Normal
orbit.

Other: None
IMPRESSION: Negative CT head

Mild mucosal edema in the paranasal sinuses

## 2016-03-25 MED ORDER — IBUPROFEN 400 MG PO TABS
ORAL_TABLET | ORAL | Status: AC
  Administered 2016-03-25: 400 mg via ORAL
  Filled 2016-03-25: qty 1

## 2016-03-25 MED ORDER — IBUPROFEN 400 MG PO TABS
400.0000 mg | ORAL_TABLET | Freq: Once | ORAL | Status: AC
  Administered 2016-03-25: 400 mg via ORAL

## 2016-03-25 NOTE — ED Provider Notes (Signed)
[facility] Emergency Department Provider Note   ____________________________________________    I have reviewed the triage vital signs and the nursing notes.   HISTORY  Chief Complaint Altered Mental Status     HPI Nicholas Weiss is a 65 y.o. male who was sent to the emergency department by group home because of possible altered mental status. Patient reports he feels well and has no complaints. He reported this morning that he had a mild headache so he took an Excedrin, apparently this alarmed group home staff because there was some question of whether was actually an Excedrin although he says he bought at the store. He reports his headache is improved. He does feel mildly dizzy, he denies focal neuro deficits. Denies drug or alcohol abuse   Past Medical History:  Diagnosis Date  . Diabetes mellitus without complication ([place])   . Hypercholesteremia   . Hypertension     Patient Active Problem List   Diagnosis Date Noted  . Hypercholesterolemia 01/24/2016  . Essential (primary) hypertension 06/07/2006  . Diabetes mellitus type 2, uncontrolled ([place]) 05/31/2006    Past Surgical History:  Procedure Laterality Date  . KNEE SURGERY Right    Torn meniscus  . KNEE SURGERY Left     Prior to Admission medications   Medication Sig Start Date End Date Taking? Authorizing Provider  hydrochlorothiazide (HYDRODIURIL) 50 MG tablet Take 1 tablet by mouth daily.    Historical Provider, MD  Insulin Degludec (TRESIBA FLEXTOUCH) 200 UNIT/ML SOPN Inject 40 Units into the skin daily. 01/24/16   Birdie Sons, MD  insulin lispro (HUMALOG) 100 UNIT/ML injection Inject 10 Units into the skin 3 (three) times daily before meals. Per sliding scale    Historical Provider, MD  lovastatin (MEVACOR) 40 MG tablet Take 1 tablet by mouth daily.    Historical Provider, MD  mupirocin ointment (BACTROBAN) 2 % Apply to affected area 3 times daily 01/16/16 01/15/17  Earleen Newport, MD  ondansetron (ZOFRAN) 4 MG tablet Take 1 tablet (4 mg total) by mouth daily as needed for nausea or vomiting. 08/06/15   Earleen Newport, MD  oxyCODONE-acetaminophen (PERCOCET) 5-325 MG tablet Take 2 tablets by mouth every 6 (six) hours as needed for moderate pain or severe pain. 08/06/15   Earleen Newport, MD     Allergies Penicillins  Family History  Problem Relation Age of Onset  . Heart attack Father   . Hypertension Sister   . Cancer Sister     Social History Social History  Substance Use Topics  . Smoking status: Never Smoker  . Smokeless tobacco: Never Used  . Alcohol use 0.6 oz/week    1 Cans of beer per week     Comment: weekly    Review of Systems  Constitutional: No fever/chills Eyes: No visual changes.  ENT: No neck pain Cardiovascular: Denies chest pain. Respiratory: Denies shortness of breath. No cough Gastrointestinal: No nausea, no vomiting.    Musculoskeletal: Negative for back pain. Skin: Negative for rash. Neurological: Negative for focal weakness  10-point ROS otherwise negative.  ____________________________________________   PHYSICAL EXAM:  VITAL SIGNS: ED Triage Vitals [03/25/16 0801]  Enc Vitals Group     BP (!) 179/101     Pulse Rate 86     Resp 18     Temp 98 F (36.7 C)     Temp src      SpO2 97 %     Weight 160 lb (72.6  kg)     Height 5' 10.5" (1.791 m)     Head Circumference      Peak Flow      Pain Score 7     Pain Loc      Pain Edu?      Excl. in [place]?     Constitutional: Alert and oriented. No acute distress. Pleasant and interactive Eyes: Conjunctivae are normal.  Head: Atraumatic. Nose: No congestion/rhinnorhea. Mouth/Throat: Mucous membranes are moist.    Cardiovascular: Normal rate, regular rhythm. Grossly normal heart sounds.  Good peripheral circulation. Respiratory: Normal respiratory effort.  No retractions. Lungs CTAB. Gastrointestinal: Soft and nontender. No distention.  No CVA  tenderness. Genitourinary: deferred Musculoskeletal: No lower extremity tenderness nor edema.  Warm and well perfused Neurologic:  Normal speech and language. No gross focal neurologic deficits are appreciated. Cranial nerves II through XII normal, normal strength in all extremities Skin:  Skin is warm, dry and intact. No rash noted. Psychiatric: Mood and affect are normal. Speech and behavior are normal.  ____________________________________________   LABS (all labs ordered are listed, but only abnormal results are displayed)  Labs Reviewed  CBC  COMPREHENSIVE METABOLIC PANEL  URINE DRUG SCREEN, QUALITATIVE ([place])  ETHANOL  ACETAMINOPHEN LEVEL  SALICYLATE LEVEL   ____________________________________________  EKG  ED ECG REPORT I, Lavonia Drafts, the attending physician, personally viewed and interpreted this ECG.  Date: 03/25/2016  Rate: 83 Rhythm: normal sinus rhythm QRS Axis: normal Intervals: normal ST/T Wave abnormalities: normal Conduction Disturbances: none Narrative Interpretation: unremarkable  ____________________________________________  RADIOLOGY  CT head unremarkable ____________________________________________   PROCEDURES  Procedure(s) performed: No    Critical Care performed: No ____________________________________________   INITIAL IMPRESSION / ASSESSMENT AND PLAN / ED COURSE  Pertinent labs & imaging results that were available during my care of the patient were reviewed by me and considered in my medical decision making (see chart for details).  Patient overall well-appearing. No evidence of altered mental status at this time, we will check labs, CT and reevaluate.  Clinical Course as of Mar 25 1258  Wed Mar 25, 2016  1052 Patient sleeping in room, fiance says he seems normal to her  [RK]  1151 Fiance again reiterates that the patient is at his baseline. He states that he is ready to go  [RK]    Clinical Course User  Index [RK] Lavonia Drafts, MD  Patient appears to be at his baseline. Fianc agrees, okay for discharge at this point suspect drug-related altered mental status ____________________________________________   FINAL CLINICAL IMPRESSION(S) / ED DIAGNOSES  Final diagnoses:  Altered mental status, unspecified altered mental status type      NEW MEDICATIONS STARTED DURING THIS VISIT:  New Prescriptions   No medications on file     Note:  This document was prepared using Dragon voice recognition software and may include unintentional dictation errors.    Lavonia Drafts, MD 03/25/16 1300

## 2016-03-25 NOTE — ED Triage Notes (Signed)
EMS pt from "[facility]" , ems reports slurred speech, drowsy, AMS.  Pt arrives drowsy , headache, dizzy. Pt alert and oriented x4

## 2016-04-06 ENCOUNTER — Encounter: Payer: Self-pay | Admitting: Emergency Medicine

## 2016-04-06 ENCOUNTER — Inpatient Hospital Stay
Admission: EM | Admit: 2016-04-06 | Discharge: 2016-04-08 | DRG: 637 | Disposition: A | Discharge status: Home / Self Care | Payer: Self-pay | Attending: Internal Medicine | Admitting: Internal Medicine

## 2016-04-06 DIAGNOSIS — E860 Dehydration: Secondary | ICD-10-CM | POA: Diagnosis present

## 2016-04-06 DIAGNOSIS — F1410 Cocaine abuse, uncomplicated: Secondary | ICD-10-CM | POA: Diagnosis present

## 2016-04-06 DIAGNOSIS — Z8249 Family history of ischemic heart disease and other diseases of the circulatory system: Secondary | ICD-10-CM

## 2016-04-06 DIAGNOSIS — Z7982 Long term (current) use of aspirin: Secondary | ICD-10-CM

## 2016-04-06 DIAGNOSIS — F1210 Cannabis abuse, uncomplicated: Secondary | ICD-10-CM | POA: Diagnosis present

## 2016-04-06 DIAGNOSIS — E1110 Type 2 diabetes mellitus with ketoacidosis without coma: Principal | ICD-10-CM | POA: Diagnosis present

## 2016-04-06 DIAGNOSIS — I10 Essential (primary) hypertension: Secondary | ICD-10-CM | POA: Diagnosis present

## 2016-04-06 DIAGNOSIS — R0789 Other chest pain: Secondary | ICD-10-CM | POA: Diagnosis present

## 2016-04-06 DIAGNOSIS — N170 Acute kidney failure with tubular necrosis: Secondary | ICD-10-CM | POA: Diagnosis present

## 2016-04-06 DIAGNOSIS — E7800 Pure hypercholesterolemia, unspecified: Secondary | ICD-10-CM | POA: Diagnosis present

## 2016-04-06 DIAGNOSIS — Z880 Allergy status to penicillin: Secondary | ICD-10-CM

## 2016-04-06 DIAGNOSIS — T383X6A Underdosing of insulin and oral hypoglycemic [antidiabetic] drugs, initial encounter: Secondary | ICD-10-CM | POA: Diagnosis present

## 2016-04-06 DIAGNOSIS — N289 Disorder of kidney and ureter, unspecified: Secondary | ICD-10-CM

## 2016-04-06 DIAGNOSIS — Z23 Encounter for immunization: Secondary | ICD-10-CM

## 2016-04-06 DIAGNOSIS — Z79899 Other long term (current) drug therapy: Secondary | ICD-10-CM

## 2016-04-06 DIAGNOSIS — Z794 Long term (current) use of insulin: Secondary | ICD-10-CM

## 2016-04-06 DIAGNOSIS — R1110 Vomiting, unspecified: Secondary | ICD-10-CM

## 2016-04-06 DIAGNOSIS — E785 Hyperlipidemia, unspecified: Secondary | ICD-10-CM | POA: Diagnosis present

## 2016-04-06 DIAGNOSIS — Z91128 Patient's intentional underdosing of medication regimen for other reason: Secondary | ICD-10-CM

## 2016-04-06 DIAGNOSIS — E1142 Type 2 diabetes mellitus with diabetic polyneuropathy: Secondary | ICD-10-CM | POA: Diagnosis present

## 2016-04-06 HISTORY — DX: Type 2 diabetes mellitus with ketoacidosis without coma: E11.10

## 2016-04-06 LAB — CBC
HEMATOCRIT: 42.9 % (ref 40.0–52.0)
HEMOGLOBIN: 14 g/dL (ref 13.0–18.0)
MCH: 30.6 pg (ref 26.0–34.0)
MCHC: 32.6 g/dL (ref 32.0–36.0)
MCV: 93.7 fL (ref 80.0–100.0)
Platelets: 311 10*3/uL (ref 150–440)
RBC: 4.58 MIL/uL (ref 4.40–5.90)
RDW: 13.7 % (ref 11.5–14.5)
WBC: 8.9 10*3/uL (ref 3.8–10.6)

## 2016-04-06 LAB — BLOOD GAS, ARTERIAL
ACID-BASE DEFICIT: 4.7 mmol/L — AB (ref 0.0–2.0)
BICARBONATE: 21.1 mmol/L (ref 20.0–28.0)
FIO2: 0.21
O2 SAT: 95.8 %
PATIENT TEMPERATURE: 37
PH ART: 7.32 — AB (ref 7.350–7.450)
pCO2 arterial: 41 mmHg (ref 32.0–48.0)
pO2, Arterial: 87 mmHg (ref 83.0–108.0)

## 2016-04-06 LAB — URINALYSIS, COMPLETE (UACMP) WITH MICROSCOPIC
BACTERIA UA: NONE SEEN
BILIRUBIN URINE: NEGATIVE
Glucose, UA: >=500 mg/dL — AB
HGB URINE DIPSTICK: NEGATIVE
Ketones, ur: 20 mg/dL — AB
LEUKOCYTES UA: NEGATIVE
NITRITE: NEGATIVE
PROTEIN: NEGATIVE mg/dL
SPECIFIC GRAVITY, URINE: 1.023 (ref 1.005–1.030)
pH: 5 (ref 5.0–8.0)

## 2016-04-06 LAB — COMPREHENSIVE METABOLIC PANEL
ALBUMIN: 4.1 g/dL (ref 3.5–5.0)
ALT: 15 U/L — ABNORMAL LOW (ref 17–63)
ANION GAP: 18 — AB (ref 5–15)
AST: 21 U/L (ref 15–41)
Alkaline Phosphatase: 74 U/L (ref 38–126)
BUN: 63 mg/dL — AB (ref 6–20)
CHLORIDE: 101 mmol/L (ref 101–111)
CO2: 17 mmol/L — ABNORMAL LOW (ref 22–32)
Calcium: 9 mg/dL (ref 8.9–10.3)
Creatinine, Ser: 2.91 mg/dL — ABNORMAL HIGH (ref 0.61–1.24)
GFR calc Af Amer: 25 mL/min — ABNORMAL LOW (ref >60)
GFR calc non Af Amer: 21 mL/min — ABNORMAL LOW (ref >60)
GLUCOSE: 696 mg/dL — AB (ref 65–99)
Potassium: 4.7 mmol/L (ref 3.5–5.1)
Sodium: 136 mmol/L (ref 135–145)
Total Bilirubin: 1.3 mg/dL — ABNORMAL HIGH (ref 0.3–1.2)
Total Protein: 8.6 g/dL — ABNORMAL HIGH (ref 6.5–8.1)

## 2016-04-06 LAB — GLUCOSE, CAPILLARY
GLUCOSE-CAPILLARY: 550 mg/dL — AB (ref 65–99)
GLUCOSE-CAPILLARY: 518 mg/dL — AB (ref 65–99)
GLUCOSE-CAPILLARY: 389 mg/dL — AB (ref 65–99)
Glucose-Capillary: 285 mg/dL — ABNORMAL HIGH (ref 65–99)
Glucose-Capillary: 388 mg/dL — ABNORMAL HIGH (ref 65–99)

## 2016-04-06 LAB — LIPASE, BLOOD: LIPASE: 19 U/L (ref 11–51)

## 2016-04-06 MED ORDER — SODIUM CHLORIDE 0.9 % IV SOLN: INTRAVENOUS | Status: DC

## 2016-04-06 MED ORDER — INSULIN REGULAR HUMAN 100 UNIT/ML IJ SOLN
INTRAMUSCULAR | Status: DC
  Administered 2016-04-06: 4.9 [IU]/h via INTRAVENOUS
  Filled 2016-04-06: qty 2.5

## 2016-04-06 MED ORDER — SODIUM CHLORIDE 0.9 % IV BOLUS (SEPSIS)
1000.0000 mL | Freq: Once | INTRAVENOUS | Status: AC
  Administered 2016-04-06: 1000 mL via INTRAVENOUS

## 2016-04-06 MED ORDER — INSULIN REGULAR HUMAN 100 UNIT/ML IJ SOLN
INTRAMUSCULAR | Status: DC
  Filled 2016-04-06: qty 2.5

## 2016-04-06 MED ORDER — DEXTROSE-NACL 5-0.45 % IV SOLN
INTRAVENOUS | Status: DC
  Administered 2016-04-06: 20:00:00 via INTRAVENOUS

## 2016-04-06 NOTE — ED Triage Notes (Signed)
Feeling fatigue.  Says sugar was up.  Brought by ems.  2 iv access left arm.

## 2016-04-06 NOTE — ED Provider Notes (Signed)
[facility] Emergency Department Provider Note  ____________________________________________  Time seen: Approximately 6:43 PM  I have reviewed the triage vital signs and the nursing notes.   HISTORY  Chief Complaint Weakness    HPI Nicholas Weiss is a 66 y.o. male with a history of DM 2, HTN, HL, presenting with 2 days of vomiting and hyperglycemia. The patient reports that for the past 3-4 days he has had blood sugars, as high as 500s this morning. For 2 days, he has been unable to keep down any food or liquid with associated anorexia. No abdominal pain, constipation or diarrhea. No fevers or chills. No known sick contacts. He reports that he has been compliant with his insulin regimen.   Past Medical History:  Diagnosis Date  . Diabetes mellitus without complication ([place])   . Hypercholesteremia   . Hypertension     Patient Active Problem List   Diagnosis Date Noted  . Hypercholesterolemia 01/24/2016  . Essential (primary) hypertension 06/07/2006  . Diabetes mellitus type 2, uncontrolled ([place]) 05/31/2006    Past Surgical History:  Procedure Laterality Date  . KNEE SURGERY Right    Torn meniscus  . KNEE SURGERY Left     Current Outpatient Rx  . Order #: [id number] Class: Historical Med  . Order #: [id number] Class: Historical Med  . Order #: [id number] Class: Sample  . Order #: [id number] Class: Historical Med  . Order #: [id number] Class: Historical Med  . Order #: [id number] Class: Print  . Order #: [id number] Class: Print  . Order #: [id number] Class: Print    Allergies Penicillins  Family History  Problem Relation Age of Onset  . Heart attack Father   . Hypertension Sister   . Cancer Sister     Social History Social History  Substance Use Topics  . Smoking status: Never Smoker  . Smokeless tobacco: Never Used  . Alcohol use 0.6 oz/week    1 Cans of beer per week     Comment: weekly    Review of Systems Constitutional: No  fever/chills.No lightheadedness or syncope. Positive generalized malaise and weakness. Eyes: No visual changes. ENT: No sore throat. No congestion or rhinorrhea. Cardiovascular: Denies chest pain. Denies palpitations. Respiratory: Denies shortness of breath.  No cough. Gastrointestinal: No abdominal pain.  Positive nausea, positive vomiting.  No diarrhea.  No constipation. Genitourinary: Negative for dysuria. Musculoskeletal: Negative for back pain. Skin: Negative for rash. Neurological: Negative for headaches. No focal numbness, tingling or weakness.   10-point ROS otherwise negative.  ____________________________________________   PHYSICAL EXAM:  VITAL SIGNS: ED Triage Vitals  Enc Vitals Group     BP 04/06/16 1730 112/84     Pulse Rate 04/06/16 1730 (!) 110     Resp 04/06/16 1730 18     Temp 04/06/16 1730 97.4 F (36.3 C)     Temp Source 04/06/16 1730 Oral     SpO2 04/06/16 1730 100 %     Weight 04/06/16 1731 160 lb (72.6 kg)     Height 04/06/16 1731 5\' 10"  (1.778 m)     Head Circumference --      Peak Flow --      Pain Score 04/06/16 1731 0     Pain Loc --      Pain Edu? --      Excl. in [place]? --     Constitutional: Alert and oriented. Chronically ill appearing, mildly uncomfortable but nontoxic. Answers questions appropriately. Eyes: Conjunctivae are normal.  EOMI. No scleral icterus. Head: Atraumatic. Nose: No congestion/rhinnorhea.  Mouth/Throat: Mucous membranes are moist.  Neck: No stridor.  Supple.  No JVD. No meningismus. Cardiovascular: Normal rate, regular rhythm. No murmurs, rubs or gallops.  Respiratory: Normal respiratory effort.  No accessory muscle use or retractions. Lungs CTAB.  No wheezes, rales or ronchi. Gastrointestinal: Soft, nontender and nondistended.  No guarding or rebound.  No peritoneal signs. Musculoskeletal: No LE edema.  Neurologic:  A&Ox3.  Speech is clear.  Face and smile are symmetric.  EOMI.  Moves all extremities well. Skin:  Skin is  warm, dry and intact. No rash noted. Mild jaundice. Psychiatric: Mood and affect are normal. Speech and behavior are normal.  Normal judgement.  ____________________________________________   LABS (all labs ordered are listed, but only abnormal results are displayed)  Labs Reviewed  COMPREHENSIVE METABOLIC PANEL - Abnormal; Notable for the following:       Result Value   CO2 17 (*)    Glucose, Bld 696 (*)    BUN 63 (*)    Creatinine, Ser 2.91 (*)    Total Protein 8.6 (*)    ALT 15 (*)    Total Bilirubin 1.3 (*)    GFR calc non Af Amer 21 (*)    GFR calc Af Amer 25 (*)    Anion gap 18 (*)    All other components within normal limits  LIPASE, BLOOD  CBC  URINALYSIS, COMPLETE (UACMP) WITH MICROSCOPIC  BLOOD GAS, VENOUS   ____________________________________________  EKG  ED ECG REPORT I, Eula Listen, the attending physician, personally viewed and interpreted this ECG.   Date: 04/06/2016  EKG Time: 2019  Rate: 88  Rhythm: normal sinus rhythm  Axis: normal  Intervals:none  ST&T Change: twave inv V3-5; no STEMI  ____________________________________________  RADIOLOGY  No results found.  ____________________________________________   PROCEDURES  Procedure(s) performed: None  Procedures  Critical Care performed: Yes ____________________________________________   INITIAL IMPRESSION / ASSESSMENT AND PLAN / ED COURSE  Pertinent labs & imaging results that were available during my care of the patient were reviewed by me and considered in my medical decision making (see chart for details).  65 y.o. M w/ a hx of DM 2 presenting with 2 days of vomiting and hyperglycemia. The patient's labs are consistent with DKA today; he has a blood sugar in the 600s and an anion gap of 18. It is possible that the patient has a viral GI illness which has him over into DKA, or that his DKA is the cause of his vomiting. There is no finding on his abdominal examination of  the consistent with an acute intra-abdominal infectious or surgical process. Imaging is not warranted at this time. We'll start him on an insulin drip, treat him with IV fluids and antiemetics, and admit him to the hospital for further treatment and evaluation.  CRITICAL CARE Performed by: Eula Listen   Total critical care time: 35 minutes  Critical care time was exclusive of separately billable procedures and treating other patients.  Critical care was necessary to treat or prevent imminent or life-threatening deterioration.  Critical care was time spent personally by me on the following activities: development of treatment plan with patient and/or surrogate as well as nursing, discussions with consultants, evaluation of patient's response to treatment, examination of patient, obtaining history from patient or surrogate, ordering and performing treatments and interventions, ordering and review of laboratory studies, ordering and review of radiographic studies, pulse oximetry and re-evaluation of patient's condition.   ____________________________________________  FINAL CLINICAL IMPRESSION(S) / ED DIAGNOSES  Final diagnoses:  Diabetic ketoacidosis without coma associated with type 2 diabetes mellitus (HCC)  Intractable vomiting, presence of nausea not specified, unspecified vomiting type    Clinical Course       NEW MEDICATIONS STARTED DURING THIS VISIT:  New Prescriptions   No medications on file      Eula Listen, MD 04/06/16 2331

## 2016-04-06 NOTE — H&P (Signed)
History and Physical   [facility] [place] @ [facility] Admission History and Physical [employer], D.O.    Patient Name: Nicholas Weiss MR#: [id number] Date of Birth: [birth year] Date of Admission: 04/06/2016  Referring MD/NP/PA: Eula Listen, MD Primary Care Physician: Lelon Huh, MD Patient coming from: Home  Chief Complaint: "High sugars", nausea, vomiting  HPI: Nicholas Weiss is a 66 y.o. male with a known history of insulin dependent DM, HTN, HLD was in a usual state of health until 4 days prior to arrival when he noticed his blood sugar levels were high in the morning. He reports blood sugar levels in the 500s during this time. He developed nausea and vomiting 2 days prior to arrival. He tried to eat but has been unable to keep either food or liquid over this time. He states he may have over eaten over the last few days. Admits fatigue, weakness, numbness and tingling in feet and hands. He states he has been compliant "for the most part" with his insulin regimen and with checking his blood glucose. States he sometimes misses checks and will skip insulin.  He denies fevers, chills, recent illness, diarrhea/constipation, abdominal pain, CP or SOB. Denies headache, dizziness, or change in mental status.   Incidentally he noted a recurrent pain in his right lateral lower rib cage. It comes and goes is sharp in nature worse with inspiration and pressure. He has noticed this pain over the last month. Believes it may be associated with meals but is unsure. He has not taken anything to relieve this pain and notices it both at rest and with activity. It resolves spontaneously. It is not associated with diaphoresis, nausea, vomiting, dizziness, SOB or chest pain.   Otherwise there has been no change in status. Patient has been taking medication as prescribed and there has been no recent change in medication.  No recent antibiotics.  There has been no travel or sick contacts.  Last hospitalization for hyperglycemia was 08/06/15.  ED Course: Given IVF NS bolus, IVF D5 drip, Insulin regular.  Review of Systems:  CONSTITUTIONAL: Positive for fatigue, weakness. No fever/chills, weight gain/loss, headache. EYES: No blurry or double vision. ENT: No tinnitus, postnasal drip, redness or soreness of the oropharynx. RESPIRATORY: No cough, dyspnea, wheeze.  No hemoptysis.  CARDIOVASCULAR: Positive for right lateral chest pain. No palpitations, syncope, orthopnea. No lower extremity edema.  GASTROINTESTINAL: Positive for nausea, vomiting. No abdominal pain, diarrhea, constipation.  No hematemesis, melena or hematochezia. GENITOURINARY: No dysuria, frequency, hematuria. ENDOCRINE: No polyuria or nocturia. No heat or cold intolerance. HEMATOLOGY: No anemia, bruising, bleeding. INTEGUMENTARY: No rashes, ulcers, lesions. MUSCULOSKELETAL: No arthritis, gout, dyspnea. NEUROLOGIC: No numbness, tingling, ataxia, seizure-type activity, weakness. PSYCHIATRIC: No anxiety, depression, insomnia.   Past Medical History:  Diagnosis Date  . Diabetes mellitus without complication ([place])   . Hypercholesteremia   . Hypertension     Past Surgical History:  Procedure Laterality Date  . KNEE SURGERY Right    Torn meniscus  . KNEE SURGERY Left      reports that he has never smoked. He has never used smokeless tobacco. He reports that he drinks about 0.6 oz of alcohol per week . He reports that he uses drugs, including Marijuana.  Allergies  Allergen Reactions  . Penicillins Other (See Comments)    Syncope  Has patient had a PCN reaction causing immediate rash, facial/tongue/throat swelling, SOB or lightheadedness with hypotension: No Has patient had a PCN reaction causing severe rash involving mucus membranes  or skin necrosis: No Has patient had a PCN reaction that required hospitalization No Has patient had a PCN reaction occurring within the last 10 years: No If all of the above  answers are "NO", then may proceed with Cephalosporin use.     Family History  Problem Relation Age of Onset  . Heart attack Father   . Hypertension Sister   . Cancer Sister    Family history has been reviewed and confirmed with patient.   Prior to Admission medications   Medication Sig Start Date End Date Taking? Authorizing Provider  aspirin 81 MG chewable tablet Chew 1 tablet by mouth daily.   Yes Historical Provider, MD  hydrochlorothiazide (HYDRODIURIL) 50 MG tablet Take 1 tablet by mouth daily.   Yes Historical Provider, MD  insulin detemir (LEVEMIR) 100 UNIT/ML injection Inject 40 Units into the skin at bedtime.   Yes Historical Provider, MD  insulin lispro (HUMALOG) 100 UNIT/ML injection Inject 10 Units into the skin 3 (three) times daily as needed for high blood sugar. Per sliding scale    Yes Historical Provider, MD  lovastatin (MEVACOR) 40 MG tablet Take 40 mg by mouth at bedtime.    Yes Historical Provider, MD    Physical Exam: Vitals:   04/06/16 1730 04/06/16 1731 04/06/16 1936  BP: 112/84  125/82  Pulse: (!) 110  92  Resp: 18  18  Temp: 97.4 F (36.3 C)    TempSrc: Oral    SpO2: 100%  99%  Weight:  72.6 kg (160 lb)   Height:  5\' 10"  (1.778 m)     GENERAL: 66 y.o.-year-old African American  patient, well-developed, well-nourished lying in the bed in no acute distress.  Pleasant and cooperative.   HEENT: Head atraumatic, normocephalic. Pupils equal, round, reactive to light and accommodation. No scleral icterus. Extraocular muscles intact. Nares are patent. Oropharynx is clear. Mucus membranes dry. NECK: Supple, full range of motion. No JVD, no bruit heard. No thyroid enlargement, no tenderness, no cervical lymphadenopathy. CHEST: Normal breath sounds bilaterally. No wheezing, rales, rhonchi or crackles. No use of accessory muscles of respiration.  Positive for reproducible right lateral chest wall tenderness.  CARDIOVASCULAR: S1, S2 normal. No murmurs, rubs, or  gallops. Cap refill <2 seconds. Pulses intact distally.  ABDOMEN: Soft, nondistended, nontender. No rebound, guarding, rigidity. Normoactive bowel sounds present in all four quadrants. No organomegaly or mass. EXTREMITIES: No pedal edema, cyanosis, or clubbing. No calf tenderness or Homan's sign.  NEUROLOGIC: The patient is alert and oriented x 3. Cranial nerves II through XII are grossly intact with no focal sensorimotor deficit. Muscle strength 5/5 in all extremities. Sensation intact. Gait not checked. PSYCHIATRIC:  Normal affect, mood, thought content. SKIN: Warm, dry, and intact without obvious rash, lesion, or ulcer.    Labs on Admission:  CBC:  Recent Labs Lab 04/06/16 1734  WBC 8.9  HGB 14.0  HCT 42.9  MCV 93.7  PLT 151   Basic Metabolic Panel:  Recent Labs Lab 04/06/16 1734  NA 136  K 4.7  CL 101  CO2 17*  GLUCOSE 696*  BUN 63*  CREATININE 2.91*  CALCIUM 9.0   GFR: Estimated Creatinine Clearance: 26.7 mL/min (by C-G formula based on SCr of 2.91 mg/dL (H)). Liver Function Tests:  Recent Labs Lab 04/06/16 1734  AST 21  ALT 15*  ALKPHOS 74  BILITOT 1.3*  PROT 8.6*  ALBUMIN 4.1    Recent Labs Lab 04/06/16 1734  LIPASE 19   No results for  input(s): AMMONIA in the last 168 hours. Coagulation Profile: No results for input(s): INR, PROTIME in the last 168 hours. Cardiac Enzymes: No results for input(s): CKTOTAL, CKMB, CKMBINDEX, TROPONINI in the last 168 hours. BNP (last 3 results) No results for input(s): PROBNP in the last 8760 hours. HbA1C: No results for input(s): HGBA1C in the last 72 hours. CBG:  Recent Labs Lab 04/06/16 1945 04/06/16 2044  GLUCAP 550* 518*   Lipid Profile: No results for input(s): CHOL, HDL, LDLCALC, TRIG, CHOLHDL, LDLDIRECT in the last 72 hours. Thyroid Function Tests: No results for input(s): TSH, T4TOTAL, FREET4, T3FREE, THYROIDAB in the last 72 hours. Anemia Panel: No results for input(s): VITAMINB12, FOLATE,  FERRITIN, TIBC, IRON, RETICCTPCT in the last 72 hours. Urine analysis:    Component Value Date/Time   COLORURINE STRAW (A) 04/06/2016 1734   APPEARANCEUR CLEAR (A) 04/06/2016 1734   LABSPEC 1.023 04/06/2016 1734   PHURINE 5.0 04/06/2016 1734   GLUCOSEU >=500 (A) 04/06/2016 1734   HGBUR NEGATIVE 04/06/2016 1734   BILIRUBINUR NEGATIVE 04/06/2016 1734   KETONESUR 20 (A) 04/06/2016 1734   PROTEINUR NEGATIVE 04/06/2016 1734   NITRITE NEGATIVE 04/06/2016 1734   LEUKOCYTESUR NEGATIVE 04/06/2016 1734   Sepsis Labs: @LABRCNTIP (procalcitonin:4,lacticidven:4) )No results found for this or any previous visit (from the past 240 hour(s)).   Radiological Exams on Admission: No results found.  EKG: Normal sinus rhythm at 88 bpm with normal axis andT wave inversion V3-5.   Assessment/Plan Active Problems:   * No active hospital problems. *    This is a 66 y.o. male with a history of  insulin dependent DM, HTN, HLD now being admitted with:  1. DKA, mild - Admit to Step down  - NPO - IVF, Insulin - Check BG hourly - Check A1C - BMP Q 4hrs - Check blood cultures, fluswab.  2. AKI 2/2 Dehydration - IVF - Monitor I/O  3. Atypical chest pain with T wave inversion I, II, V3-6 - Reproducible and right sided - trend troponin - Aspirin  4. Peripheral neuropathy - Start Gabapentin - Diabetic Education Team  5. History of hyperlipidemia - Continue Lovastatin  6. History of hypertension - Hold HCTZ for now   Admission status: Step Down IV Fluids: NS --> D5 Diet/Nutrition: NPO Consults called: Diabetes Coordinator  DVT Px: Lovenox, SCDs and early ambulation. Code Status: Full Code  Disposition Plan: Discharge to home 1-2 days   All the records are reviewed and case discussed with ED provider. Management plans discussed with the patient and/or family who express understanding and agree with plan of care.  Sham Alviar D.O. on 04/06/2016 at 8:57 PM Between 7am to 6pm - Pager  - [phone] After 6pm go to [email or web address] - Proofreader [facility] [place] [facility] Office [phone] CC: Primary care physician; Lelon Huh, MD   04/06/2016, 8:57 PM

## 2016-04-06 NOTE — ED Notes (Signed)
Pt arrived from triage.  Reports generalized weakness x 2 days.  Skin w/d.  Receiving fluids

## 2016-04-06 NOTE — ED Notes (Signed)
Lab reported a blood glucose of 696.  Charge nurse has been informed.

## 2016-04-07 LAB — URINE DRUG SCREEN, QUALITATIVE (ARMC ONLY)
AMPHETAMINES, UR SCREEN: NOT DETECTED
Barbiturates, Ur Screen: NOT DETECTED
Benzodiazepine, Ur Scrn: NOT DETECTED
COCAINE METABOLITE, UR ~~LOC~~: POSITIVE — AB
Cannabinoid 50 Ng, Ur ~~LOC~~: POSITIVE — AB
MDMA (ECSTASY) UR SCREEN: NOT DETECTED
METHADONE SCREEN, URINE: NOT DETECTED
Opiate, Ur Screen: NOT DETECTED
Phencyclidine (PCP) Ur S: NOT DETECTED
TRICYCLIC, UR SCREEN: NOT DETECTED

## 2016-04-07 LAB — CBC
HEMATOCRIT: 36.8 % — AB (ref 40.0–52.0)
HEMOGLOBIN: 12.4 g/dL — AB (ref 13.0–18.0)
MCH: 30.8 pg (ref 26.0–34.0)
MCHC: 33.8 g/dL (ref 32.0–36.0)
MCV: 91.2 fL (ref 80.0–100.0)
Platelets: 265 10*3/uL (ref 150–440)
RBC: 4.04 MIL/uL — AB (ref 4.40–5.90)
RDW: 13.3 % (ref 11.5–14.5)
WBC: 8.2 10*3/uL (ref 3.8–10.6)

## 2016-04-07 LAB — BASIC METABOLIC PANEL
ANION GAP: 6 (ref 5–15)
ANION GAP: 6 (ref 5–15)
ANION GAP: 7 (ref 5–15)
Anion gap: 7 (ref 5–15)
BUN: 52 mg/dL — AB (ref 6–20)
BUN: 54 mg/dL — AB (ref 6–20)
BUN: 53 mg/dL — AB (ref 6–20)
BUN: 52 mg/dL — AB (ref 6–20)
CHLORIDE: 109 mmol/L (ref 101–111)
CHLORIDE: 110 mmol/L (ref 101–111)
CHLORIDE: 109 mmol/L (ref 101–111)
CO2: 23 mmol/L (ref 22–32)
CO2: 23 mmol/L (ref 22–32)
CO2: 26 mmol/L (ref 22–32)
CO2: 22 mmol/L (ref 22–32)
CREATININE: 2.43 mg/dL — AB (ref 0.61–1.24)
Calcium: 8.6 mg/dL — ABNORMAL LOW (ref 8.9–10.3)
Calcium: 8.6 mg/dL — ABNORMAL LOW (ref 8.9–10.3)
Calcium: 8.9 mg/dL (ref 8.9–10.3)
Calcium: 8.6 mg/dL — ABNORMAL LOW (ref 8.9–10.3)
Chloride: 109 mmol/L (ref 101–111)
Creatinine, Ser: 2.45 mg/dL — ABNORMAL HIGH (ref 0.61–1.24)
Creatinine, Ser: 2.34 mg/dL — ABNORMAL HIGH (ref 0.61–1.24)
Creatinine, Ser: 1.95 mg/dL — ABNORMAL HIGH (ref 0.61–1.24)
GFR calc Af Amer: 31 mL/min — ABNORMAL LOW (ref >60)
GFR calc Af Amer: 31 mL/min — ABNORMAL LOW (ref >60)
GFR calc Af Amer: 32 mL/min — ABNORMAL LOW (ref >60)
GFR calc Af Amer: 40 mL/min — ABNORMAL LOW (ref >60)
GFR calc non Af Amer: 27 mL/min — ABNORMAL LOW (ref >60)
GFR, EST NON AFRICAN AMERICAN: 26 mL/min — AB (ref >60)
GFR, EST NON AFRICAN AMERICAN: 28 mL/min — AB (ref >60)
GFR, EST NON AFRICAN AMERICAN: 35 mL/min — AB (ref >60)
GLUCOSE: 268 mg/dL — AB (ref 65–99)
Glucose, Bld: 270 mg/dL — ABNORMAL HIGH (ref 65–99)
Glucose, Bld: 134 mg/dL — ABNORMAL HIGH (ref 65–99)
Glucose, Bld: 164 mg/dL — ABNORMAL HIGH (ref 65–99)
POTASSIUM: 3.7 mmol/L (ref 3.5–5.1)
POTASSIUM: 3.8 mmol/L (ref 3.5–5.1)
POTASSIUM: 4.2 mmol/L (ref 3.5–5.1)
POTASSIUM: 4.1 mmol/L (ref 3.5–5.1)
SODIUM: 139 mmol/L (ref 135–145)
Sodium: 138 mmol/L (ref 135–145)
Sodium: 142 mmol/L (ref 135–145)
Sodium: 138 mmol/L (ref 135–145)

## 2016-04-07 LAB — MAGNESIUM: Magnesium: 2.4 mg/dL (ref 1.7–2.4)

## 2016-04-07 LAB — GLUCOSE, CAPILLARY
GLUCOSE-CAPILLARY: 183 mg/dL — AB (ref 65–99)
GLUCOSE-CAPILLARY: 100 mg/dL — AB (ref 65–99)
GLUCOSE-CAPILLARY: 83 mg/dL (ref 65–99)
GLUCOSE-CAPILLARY: 108 mg/dL — AB (ref 65–99)
GLUCOSE-CAPILLARY: 128 mg/dL — AB (ref 65–99)
GLUCOSE-CAPILLARY: 157 mg/dL — AB (ref 65–99)
GLUCOSE-CAPILLARY: 340 mg/dL — AB (ref 65–99)
GLUCOSE-CAPILLARY: 220 mg/dL — AB (ref 65–99)
Glucose-Capillary: 274 mg/dL — ABNORMAL HIGH (ref 65–99)
Glucose-Capillary: 123 mg/dL — ABNORMAL HIGH (ref 65–99)
Glucose-Capillary: 177 mg/dL — ABNORMAL HIGH (ref 65–99)
Glucose-Capillary: 168 mg/dL — ABNORMAL HIGH (ref 65–99)

## 2016-04-07 LAB — PHOSPHORUS: Phosphorus: 2.8 mg/dL (ref 2.5–4.6)

## 2016-04-07 LAB — TROPONIN I
TROPONIN I: 0.04 ng/mL — AB (ref <0.03)
TROPONIN I: 0.03 ng/mL — AB (ref <0.03)
TROPONIN I: 0.03 ng/mL — AB (ref <0.03)

## 2016-04-07 LAB — BETA-HYDROXYBUTYRIC ACID: BETA-HYDROXYBUTYRIC ACID: 0.09 mmol/L (ref 0.05–0.27)

## 2016-04-07 MED ORDER — ACETAMINOPHEN 650 MG RE SUPP: 650.0000 mg | Freq: Four times a day (QID) | RECTAL | Status: DC | PRN

## 2016-04-07 MED ORDER — SODIUM CHLORIDE 0.9 % IV SOLN
INTRAVENOUS | Status: DC
  Administered 2016-04-07 – 2016-04-08 (×4): via INTRAVENOUS

## 2016-04-07 MED ORDER — SODIUM CHLORIDE 0.9 % IV SOLN
INTRAVENOUS | Status: DC
  Filled 2016-04-07: qty 2.5

## 2016-04-07 MED ORDER — HYDROCHLOROTHIAZIDE 25 MG PO TABS
50.0000 mg | ORAL_TABLET | Freq: Every day | ORAL | Status: DC
  Administered 2016-04-07: 50 mg via ORAL
  Filled 2016-04-07: qty 2

## 2016-04-07 MED ORDER — SODIUM CHLORIDE 0.9 % IV SOLN
INTRAVENOUS | Status: DC
Start: 2016-04-07 — End: 2016-04-07

## 2016-04-07 MED ORDER — INSULIN ASPART 100 UNIT/ML ~~LOC~~ SOLN
0.0000 [IU] | Freq: Every day | SUBCUTANEOUS | Status: DC
  Administered 2016-04-07: 22:00:00 2 [IU] via SUBCUTANEOUS
  Filled 2016-04-07: qty 2

## 2016-04-07 MED ORDER — ZOLPIDEM TARTRATE 5 MG PO TABS: 5.0000 mg | ORAL_TABLET | Freq: Every evening | ORAL | Status: DC | PRN

## 2016-04-07 MED ORDER — DEXTROSE-NACL 5-0.45 % IV SOLN: INTRAVENOUS | Status: DC

## 2016-04-07 MED ORDER — INFLUENZA VAC SPLIT QUAD 0.5 ML IM SUSY
0.5000 mL | PREFILLED_SYRINGE | INTRAMUSCULAR | Status: AC
  Administered 2016-04-08: 09:00:00 0.5 mL via INTRAMUSCULAR
  Filled 2016-04-07: qty 0.5

## 2016-04-07 MED ORDER — SODIUM CHLORIDE 0.9 % IV SOLN: INTRAVENOUS | Status: DC

## 2016-04-07 MED ORDER — BISACODYL 5 MG PO TBEC: 5.0000 mg | DELAYED_RELEASE_TABLET | Freq: Every day | ORAL | Status: DC | PRN

## 2016-04-07 MED ORDER — SODIUM CHLORIDE 0.9% FLUSH: 3.0000 mL | Freq: Two times a day (BID) | INTRAVENOUS | Status: DC

## 2016-04-07 MED ORDER — MAGNESIUM CITRATE PO SOLN
1.0000 | Freq: Once | ORAL | Status: DC | PRN
  Filled 2016-04-07: qty 296

## 2016-04-07 MED ORDER — OXYCODONE HCL 5 MG PO TABS: 5.0000 mg | ORAL_TABLET | ORAL | Status: DC | PRN

## 2016-04-07 MED ORDER — GABAPENTIN 100 MG PO CAPS
100.0000 mg | ORAL_CAPSULE | Freq: Two times a day (BID) | ORAL | Status: DC
  Administered 2016-04-07 – 2016-04-08 (×3): 100 mg via ORAL
  Filled 2016-04-07 (×3): qty 1

## 2016-04-07 MED ORDER — INSULIN GLARGINE 100 UNIT/ML ~~LOC~~ SOLN
12.0000 [IU] | Freq: Two times a day (BID) | SUBCUTANEOUS | Status: DC
  Administered 2016-04-07 – 2016-04-08 (×3): 12 [IU] via SUBCUTANEOUS
  Filled 2016-04-07 (×5): qty 0.12

## 2016-04-07 MED ORDER — PRAVASTATIN SODIUM 40 MG PO TABS
40.0000 mg | ORAL_TABLET | Freq: Every day | ORAL | Status: DC
  Administered 2016-04-07: 18:00:00 40 mg via ORAL
  Filled 2016-04-07: qty 1

## 2016-04-07 MED ORDER — ONDANSETRON HCL 4 MG/2ML IJ SOLN: 4.0000 mg | Freq: Four times a day (QID) | INTRAMUSCULAR | Status: DC | PRN

## 2016-04-07 MED ORDER — HEPARIN SODIUM (PORCINE) 5000 UNIT/ML IJ SOLN
5000.0000 [IU] | Freq: Three times a day (TID) | INTRAMUSCULAR | Status: DC
  Administered 2016-04-07 – 2016-04-08 (×3): 5000 [IU] via SUBCUTANEOUS
  Filled 2016-04-07 (×3): qty 1

## 2016-04-07 MED ORDER — IPRATROPIUM BROMIDE 0.02 % IN SOLN
0.5000 mg | Freq: Four times a day (QID) | RESPIRATORY_TRACT | Status: DC | PRN
  Filled 2016-04-07: qty 2.5

## 2016-04-07 MED ORDER — ASPIRIN 81 MG PO CHEW
81.0000 mg | CHEWABLE_TABLET | Freq: Every day | ORAL | Status: DC
  Administered 2016-04-07 – 2016-04-08 (×2): 81 mg via ORAL
  Filled 2016-04-07 (×2): qty 1

## 2016-04-07 MED ORDER — ACETAMINOPHEN 325 MG PO TABS
650.0000 mg | ORAL_TABLET | Freq: Four times a day (QID) | ORAL | Status: DC | PRN
  Administered 2016-04-08: 09:00:00 650 mg via ORAL
  Filled 2016-04-07: qty 2

## 2016-04-07 MED ORDER — ALBUTEROL SULFATE (2.5 MG/3ML) 0.083% IN NEBU: 2.5000 mg | INHALATION_SOLUTION | Freq: Four times a day (QID) | RESPIRATORY_TRACT | Status: DC | PRN

## 2016-04-07 MED ORDER — ONDANSETRON HCL 4 MG PO TABS: 4.0000 mg | ORAL_TABLET | Freq: Four times a day (QID) | ORAL | Status: DC | PRN

## 2016-04-07 MED ORDER — INSULIN ASPART 100 UNIT/ML ~~LOC~~ SOLN
4.0000 [IU] | Freq: Three times a day (TID) | SUBCUTANEOUS | Status: DC
  Administered 2016-04-07 – 2016-04-08 (×4): 4 [IU] via SUBCUTANEOUS
  Filled 2016-04-07 (×4): qty 4

## 2016-04-07 MED ORDER — INSULIN ASPART 100 UNIT/ML ~~LOC~~ SOLN
0.0000 [IU] | Freq: Three times a day (TID) | SUBCUTANEOUS | Status: DC
  Administered 2016-04-07: 3 [IU] via SUBCUTANEOUS
  Administered 2016-04-07: 18:00:00 11 [IU] via SUBCUTANEOUS
  Administered 2016-04-07: 3 [IU] via SUBCUTANEOUS
  Filled 2016-04-07 (×2): qty 3
  Filled 2016-04-07: qty 11

## 2016-04-07 MED ORDER — SODIUM CHLORIDE 0.9 % IV SOLN: 30.0000 meq | Freq: Once | INTRAVENOUS | Status: DC

## 2016-04-07 MED ORDER — PNEUMOCOCCAL VAC POLYVALENT 25 MCG/0.5ML IJ INJ
0.5000 mL | INJECTION | INTRAMUSCULAR | Status: AC
  Administered 2016-04-08: 09:00:00 0.5 mL via INTRAMUSCULAR
  Filled 2016-04-07: qty 0.5

## 2016-04-07 MED ORDER — SENNOSIDES-DOCUSATE SODIUM 8.6-50 MG PO TABS: 1.0000 | ORAL_TABLET | Freq: Every evening | ORAL | Status: DC | PRN

## 2016-04-07 NOTE — ED Notes (Signed)
Attempted to call report. Nurse chris in isolation room and will call me back

## 2016-04-07 NOTE — Progress Notes (Addendum)
Spoke with patient about diabetes and home regimen for diabetes control. Patient reports that he is followed by PCP for diabetes management. Patient reports that he currently does not have insurance and is having issues with getting his insulin. Patient states that he has 1 vial of Tresiba (almost completely used up), 1 vial of Levemir, 2 vials of Humalog, and 2 vials of 70/30 insulin at home. Patient reports that he is able to get some insulin from family that have extra insulin which is where he got 70/30 from. Patient reports that he has not taken any of the 70/30 insulin because he doesn't know anything about it. Patient reports that he has been on Levemir and Humalog and when he went to his doctor the last time his PCP provided him with some samples of Tresiba insulin to take once the Levemir ran out. Patient states that he has been using [name] 40 units QHS but admits that he is not taking the [name] every day if he doesn't need it. Patient reports that he uses Humalog correction based on glucose value. Asked for clarification on how he determines if he needs it. Patient states that if he checks his sugar and it is good then he doesn't take the [name] and sometimes skips the Humalog as well if his sugar is good. However, then he states that his sugars go way back up and he just takes the Humalog "cause it will bring my sugar down but the [name] will not." Discussed Tresiba, Levemir, Humalog and 70/30 in detail and how they should be taken.  Explained to the patient that if basal insulin is taken as prescribed and glucose is trending well, it is that way because the basal insulin is working. However when the basal insulin is skipped, the glucose steadily rises because there is no basal insulin working.  Patient states that he checks his glucose 3-4 times per day and that it fluctuates from low to Hi. Patient reports that he has experienced some issues with hypoglycemia over the past few weeks which is  another reason why he skips insulin sometimes if he doesn't think he needs it.  Discussed glucose and A1C goals. Discussed importance of checking CBGs and maintaining good CBG control to prevent long-term and short-term complications.  Explained how hyperglycemia leads to damage within blood vessels which lead to the common complications seen with uncontrolled diabetes. Stressed to the patient the importance of improving glycemic control to prevent further complications from uncontrolled diabetes. Encouraged patient to continue checking his glucose 3-4 times per day (before meals and at bedtime) and to keep a log book of glucose readings and insulin taken which he will need to take to doctor appointments. Explained to patient if he has any lows glucose values he needs to be sure his doctor is aware of that as well as insulin may need to be adjusted. Discussed generic NOVOLIN 70/30 insulin which can be purchased at [facility] for $25 per vial and provided handout on [employer] products. Encouraged patient to talk with his doctor prior to using the 70/30 to specific instructions on how much to take and ask about switching to 70/30 since it is more affordable. Patient states that he would like to see if he could qualify for any type of medication assistance. Informed patient a consult for CM would be ordered to discuss further and to request application for [facility]. Patient verbalized understanding of information discussed and he states that he has no further questions at this  time related to diabetes.  Thanks, Barnie Alderman, RN, MSN, CDE Diabetes Coordinator Inpatient Diabetes Program [phone] (Team Pager)

## 2016-04-07 NOTE — Progress Notes (Signed)
[facility] [place] NAME: Nicholas Weiss    MR#:  [id number]  DATE OF BIRTH:  1951/01/10  SUBJECTIVE: Today because of mild DKA, started on aggressive IV hydration, never required insulin drip. And the patient moved to 1C. He says he feels better. Patient says that he ran out of samples for tresiba for almost 3-4 days , and to have elevated blood sugar more than 500 so she he came to hospital because of that and also nausea and vomiting.   CHIEF COMPLAINT:   Chief Complaint  Patient presents with  . Weakness    REVIEW OF SYSTEMS:   ROS CONSTITUTIONAL: No fever, fatigue or weakness.  EYES: No blurred or double vision.  EARS, NOSE, AND THROAT: No tinnitus or ear pain.  RESPIRATORY: No cough, shortness of breath, wheezing or hemoptysis.  CARDIOVASCULAR: No chest pain, orthopnea, edema.  GASTROINTESTINAL: No nausea, vomiting,  GENITOURINARY: No dysuria, hematuria.  ENDOCRINE: No polyuria, nocturia,  HEMATOLOGY: No anemia, easy bruising or bleeding SKIN: No rash or lesion. MUSCULOSKELETAL: No joint pain or arthritis.   NEUROLOGIC: No tingling, numbness, weakness.  PSYCHIATRY: No anxiety or depression.   DRUG ALLERGIES:   Allergies  Allergen Reactions  . Penicillins Other (See Comments)    Syncope  Has patient had a PCN reaction causing immediate rash, facial/tongue/throat swelling, SOB or lightheadedness with hypotension: No Has patient had a PCN reaction causing severe rash involving mucus membranes or skin necrosis: No Has patient had a PCN reaction that required hospitalization No Has patient had a PCN reaction occurring within the last 10 years: No If all of the above answers are "NO", then may proceed with Cephalosporin use.     VITALS:  Blood pressure 124/75, pulse 95, temperature 98.2 F (36.8 C), temperature source Oral, resp. rate 16, height 5\' 10"  (1.778 m), weight 67.4 kg (148 lb 11.2 oz), SpO2 100 %.  PHYSICAL  EXAMINATION:  GENERAL:  66 y.o.-year-old patient lying in the bed with no acute distress.  EYES: Pupils equal, round, reactive to light and accommodation. No scleral icterus. Extraocular muscles intact.  HEENT: Head atraumatic, normocephalic. Oropharynx and nasopharynx clear.  NECK:  Supple, no jugular venous distention. No thyroid enlargement, no tenderness.  LUNGS: Normal breath sounds bilaterally, no wheezing, rales,rhonchi or crepitation. No use of accessory muscles of respiration.  CARDIOVASCULAR: S1, S2 normal. No murmurs, rubs, or gallops.  ABDOMEN: Soft, nontender, nondistended. Bowel sounds present. No organomegaly or mass.  EXTREMITIES: No pedal edema, cyanosis, or clubbing.  NEUROLOGIC: Cranial nerves II through XII are intact. Muscle strength 5/5 in all extremities. Sensation intact. Gait not checked.  PSYCHIATRIC: The patient is alert and oriented x 3.  SKIN: No obvious rash, lesion, or ulcer.    LABORATORY PANEL:   CBC  Recent Labs Lab 04/07/16 0115  WBC 8.2  HGB 12.4*  HCT 36.8*  PLT 265   ------------------------------------------------------------------------------------------------------------------  Chemistries   Recent Labs Lab 04/06/16 1734 04/07/16 0115  04/07/16 1108  NA 136 139  138  < > 138  K 4.7 3.8  3.7  < > 4.1  CL 101 109  109  < > 109  CO2 17* 23  23  < > 22  GLUCOSE 696* 270*  268*  < > 164*  BUN 63* 52*  54*  < > 52*  CREATININE 2.91* 2.43*  2.45*  < > 1.95*  CALCIUM 9.0 8.6*  8.6*  < > 8.6*  MG  --  2.4  --   --   AST 21  --   --   --   ALT 15*  --   --   --   ALKPHOS 74  --   --   --   BILITOT 1.3*  --   --   --   < > = values in this interval not displayed. ------------------------------------------------------------------------------------------------------------------  Cardiac Enzymes  Recent Labs Lab 04/07/16 1108  TROPONINI 0.04*    ------------------------------------------------------------------------------------------------------------------  RADIOLOGY:  No results found.  EKG:   Orders placed or performed during the hospital encounter of 04/06/16  . ED EKG  . ED EKG  . EKG 12-Lead  . EKG 12-Lead  . EKG 12-Lead    ASSESSMENT AND PLAN:   #1. Mild DKA;  Medication non compliance;Patient renal function improving, continue aggressive IV hydration, seen by diabetes nurse, now on Lantus, SSI with coverage.   2. ATN secondary to dehydration and DKA: Improving with IV fluids. #3 polysubstance abuse, urine toxicology showed cocaine,cannabinoids. in the urine.   All the records are reviewed and case discussed with Care Management/Social Workerr. Management plans discussed with the patient, family and they are in agreement.  CODE STATUS:full  TOTAL TIME TAKING CARE OF THIS PATIENT: 20minutes.   POSSIBLE D/C IN 1-2DAYS, DEPENDING ON CLINICAL CONDITION.   Epifanio Lesches M.D on 04/07/2016 at 4:40 PM  Between 7am to 6pm - Pager - [phone]  After 6pm go to [email or web address] - password [email or web address] [facility]  Office  [phone]  CC: Primary care physician; Lelon Huh, MD   Note: This dictation was prepared with Dragon dictation along with smaller phrase technology. Any transcriptional errors that result from this process are unintentional.[email or web address] [phone]

## 2016-04-07 NOTE — ED Notes (Signed)
Pt. Ate sandwich tray and is now resting comfortably. Denies needs. VS stable

## 2016-04-08 LAB — GLUCOSE, CAPILLARY
Glucose-Capillary: 103 mg/dL — ABNORMAL HIGH (ref 65–99)
Glucose-Capillary: 70 mg/dL (ref 65–99)

## 2016-04-08 LAB — BLOOD GAS, VENOUS
Acid-base deficit: 6 mmol/L — ABNORMAL HIGH (ref 0.0–2.0)
Bicarbonate: 21.9 mmol/L (ref 20.0–28.0)
PATIENT TEMPERATURE: 37
PCO2 VEN: 51 mmHg (ref 44.0–60.0)
PH VEN: 7.24 — AB (ref 7.250–7.430)

## 2016-04-08 LAB — BASIC METABOLIC PANEL
ANION GAP: 6 (ref 5–15)
BUN: 32 mg/dL — AB (ref 6–20)
CO2: 25 mmol/L (ref 22–32)
Calcium: 8.2 mg/dL — ABNORMAL LOW (ref 8.9–10.3)
Chloride: 108 mmol/L (ref 101–111)
Creatinine, Ser: 1.42 mg/dL — ABNORMAL HIGH (ref 0.61–1.24)
GFR calc Af Amer: 59 mL/min — ABNORMAL LOW (ref >60)
GFR, EST NON AFRICAN AMERICAN: 51 mL/min — AB (ref >60)
GLUCOSE: 106 mg/dL — AB (ref 65–99)
Potassium: 3.6 mmol/L (ref 3.5–5.1)
Sodium: 139 mmol/L (ref 135–145)

## 2016-04-08 LAB — HEMOGLOBIN A1C
HEMOGLOBIN A1C: 12.3 % — AB (ref 4.8–5.6)
MEAN PLASMA GLUCOSE: 306 mg/dL

## 2016-04-08 MED ORDER — INSULIN GLARGINE 100 UNIT/ML ~~LOC~~ SOLN: 12.0000 [IU] | Freq: Two times a day (BID) | SUBCUTANEOUS | 11 refills | Status: DC

## 2016-04-08 MED ORDER — INSULIN ASPART 100 UNIT/ML ~~LOC~~ SOLN: 8.0000 [IU] | Freq: Three times a day (TID) | SUBCUTANEOUS | 11 refills | Status: DC

## 2016-04-08 NOTE — Progress Notes (Signed)
Initial Nutrition Assessment  DOCUMENTATION CODES:   Not applicable  INTERVENTION:  Patient refused handout on carbohydrate counting.  Discussed in detail general healthy diet for patients with diabetes. Encouraged regular intake of balanced meals and snacks that include a carbohydrate, protein, fat, and fiber. Discussed more appropriate snack options patient can have in place of cake or pudding. Patient agreeable to adding a balanced breakfast and 1-2 snacks daily.  NUTRITION DIAGNOSIS:   Unintentional weight loss related to poor appetite, acute illness (noncompliance with insulin) as evidenced by per patient/family report, 7.5 percent weight loss over 2 weeks.  GOAL:   Patient will meet greater than or equal to 90% of their needs  MONITOR:   PO intake, Labs, Weight trends, I & O's  REASON FOR ASSESSMENT:   Diagnosis (DKA)    ASSESSMENT:   66 y.o. male with a known history of insulin dependent DM, HTN, HLD admitted with DKA, AKI secondary to dehydration, peripheral neuropathy.    -Per Home Meds patient home insulin regimen is Levemir 40 units QHS, Humalog 10 units TID PRN per sliding scale. However, after reviewing Diabetes Coordinator note, patient may not have been compliant with prescribed regimen due to confusion and difficulty getting medications.  Spoke with patient at bedside. He reports his appetite is good now, but PTA it had been poor for 2-3 days. He had been experiencing polyuria, weakness, and fatigue which made it difficult for him to eat. He reports he typically has 2 meals per day with snacks between meals, but he is unsure how best to eat to control his diabetes. He typically skips breakfast, lunch may be a sandwich, dinner is a hot meal with meat, carb, and vegetables. He reports snacks are usually cake or pudding. Patient refused handout to go over carbohydrate counting, but was open to discussion on eating with diabetes.  UBW 160-168 lbs. Patient has lost 12  lbs (7.5% body weight) over 2 weeks, which is significant for time frame.   Meal Completion: 60-100%  Medications reviewed and include: Novolog sliding scale TID with meals and daily at bedtime, Novolog 4 units TID with meals, Lantus 12 units BID, NS @ 150 ml/hr.  Labs reviewed: CBG 70-340 past 24 hrs, BUN 32, Creatinine 1.42, EGFR 59, elevated Troponin.   Nutrition-Focused physical exam completed. Findings are no fat depletion, no muscle depletion, and no edema.   Patient does not meet criteria for severe acute malnutrition as he reports his intake was only poor for 2-3 days PTA. He is at high risk for malnutrition in setting of significant weight loss. If patient was noncompliant with insulin, could be cause for weight loss.  Discussed with RN.   Diet Order:  Diet Carb Modified Fluid consistency: Thin; Room service appropriate? Yes  Skin:  Reviewed, no issues  Last BM:  04/07/2016  Height:   Ht Readings from Last 1 Encounters:  04/07/16 5' 10"  (1.778 m)    Weight:   Wt Readings from Last 1 Encounters:  04/07/16 148 lb 11.2 oz (67.4 kg)    Ideal Body Weight:  75.5 kg  BMI:  Body mass index is 21.34 kg/m.  Estimated Nutritional Needs:   Kcal:  3374-4514 (MSJ x 1.1-1.3)  Protein:  70-80 grams (1-1.2 grams/kg)  Fluid:  >/= 1.7 L/day (25 ml/kg)  EDUCATION NEEDS:   Education needs addressed  Willey Blade, MS, RD, LDN Pager: [phone] After Hours Pager: [phone]

## 2016-04-08 NOTE — Progress Notes (Signed)
PT Cancellation Note  Patient Details Name: Nicholas Weiss MRN: [id number] DOB: [birth year]   Cancelled Treatment:    Reason Eval/Treat Not Completed: Other (comment). Per RN, pt Independently ambulating in room to bathroom without evidence of instability or balance impairments.  Pt Ind PTA.  PT will sign off.  Please place new PT order if pt's presentation changes.  Thank you.   Collie Siad PT, DPT 04/08/2016, 9:39 AM

## 2016-04-08 NOTE — Discharge Summary (Signed)
Nicholas Weiss, is a 66 y.o. male  DOB Feb 20, 1951  MRN [id number].  Admission date:  04/06/2016  Admitting Physician  Harvie Bridge, DO  Discharge Date:  04/08/2016   Primary MD  Lelon Huh, MD  Recommendations for primary care physician for things to follow:  Follow  with primary doctor in 1 week   Admission Diagnosis  Dehydration [E86.0] Acute renal insufficiency [N28.9] Diabetic ketoacidosis without coma associated with type 2 diabetes mellitus ([place]) [E13.10] Intractable vomiting, presence of nausea not specified, unspecified vomiting type [R11.10]   Discharge Diagnosis  Dehydration [E86.0] Acute renal insufficiency [N28.9] Diabetic ketoacidosis without coma associated with type 2 diabetes mellitus ([place]) [E13.10] Intractable vomiting, presence of nausea not specified, unspecified vomiting type [R11.10]    Active Problems:   DKA (diabetic ketoacidoses) ([place])      Past Medical History:  Diagnosis Date  . Diabetes mellitus without complication ([place])   . Hypercholesteremia   . Hypertension     Past Surgical History:  Procedure Laterality Date  . KNEE SURGERY Right    Torn meniscus  . KNEE SURGERY Left        History of present illness and  Hospital Course:     Kindly see H&P for history of present illness and admission details, please review complete Labs, Consult reports and Test reports for all details in brief  HPI  from the history and physical done on the day of admission 66 year old male patient admitted for close, nausea, vomiting, mild DKA.    Hospital Course   #1 mild DKA sugars more than 500 at home with nausea, vomiting. Never required insulin drip. Patient received insulin IV push in the emergency room, admitted to hospitalist service , by diabetes nurse, patient noncompliant with  medications at home, patient stopped taking tresiba as he ran out of sample. sometimes skips Humalog.  Now patient on Lantus 12 units twice a day, Humalog 8 units 3 times a day with meals. Encouraged the patient to talk to pmd  using 70/30 insulin which is cheaper to purchase, right now discharged home with Lantus, Humalog.  #2 acute renal failure secondary to DKA: Improved with IV hydration, creatinine on admission 0.34 improved to 1.42.  #3 hypertension #4 hyperlipidemia.      Discharge Condition:    Follow UP  Follow-up Information    Lelon Huh, MD Follow up in 1 week(s).   Specialty:  Family Medicine Contact information: [address] [place] [address] [phone]             Discharge Instructions  and  Discharge Medications   Low sodium ADA diet    Allergies as of 04/08/2016      Reactions   Penicillins Other (See Comments)   Syncope  Has patient had a PCN reaction causing immediate rash, facial/tongue/throat swelling, SOB or lightheadedness with hypotension: No Has patient had a PCN reaction causing severe rash involving mucus membranes or skin necrosis: No Has patient had a PCN reaction that required hospitalization No Has patient had a PCN reaction occurring within the last 10 years: No If all of the above answers are "NO", then may proceed with Cephalosporin use.      Medication List    STOP taking these medications   insulin detemir 100 UNIT/ML injection Commonly known as:  LEVEMIR     TAKE these medications   aspirin 81 MG chewable tablet Chew 1 tablet by mouth daily.   hydrochlorothiazide 50 MG tablet Commonly known  as:  HYDRODIURIL Take 1 tablet by mouth daily.   insulin aspart 100 UNIT/ML injection Commonly known as:  novoLOG Inject 8 Units into the skin 3 (three) times daily with meals.   insulin glargine 100 UNIT/ML injection Commonly known as:  LANTUS Inject 0.12 mLs (12 Units total) into the skin 2 (two)  times daily.   insulin lispro 100 UNIT/ML injection Commonly known as:  HUMALOG Inject 10 Units into the skin 3 (three) times daily as needed for high blood sugar. Per sliding scale   lovastatin 40 MG tablet Commonly known as:  MEVACOR Take 40 mg by mouth at bedtime.         Diet and Activity recommendation: See Discharge Instructions above   Consults obtained - diabetes co ordinator   Major procedures and Radiology Reports - PLEASE review detailed and final reports for all details, in brief -      Ct Head Wo Contrast  Result Date: 03/25/2016 CLINICAL DATA:  Altered mental status EXAM: CT HEAD WITHOUT CONTRAST TECHNIQUE: Contiguous axial images were obtained from the base of the skull through the vertex without intravenous contrast. COMPARISON:  CT 09/25/2004 FINDINGS: Brain: Ventricle size and cerebral volume normal. Negative for acute or chronic infarction. Negative for intracranial hemorrhage. No mass or edema. Incidental note is made of extensive dural calcification. Vascular: No hyperdense vessel or unexpected calcification. Skull: Negative Sinuses/Orbits: Mild mucosal edema in the paranasal sinuses. Normal orbit. Other: None IMPRESSION: Negative CT head Mild mucosal edema in the paranasal sinuses Electronically Signed   By: Franchot Gallo M.D.   On: 03/25/2016 08:29    Micro Results     No results found for this or any previous visit (from the past 240 hour(s)).     Today   Subjective:   Nicholas Weiss today has no headache,no chest abdominal pain,no new weakness tingling or numbness, feels much better wants to go home today.   Objective:   Blood pressure (!) 153/94, pulse 76, temperature 97.8 F (36.6 C), temperature source Oral, resp. rate 20, height 5\' 10"  (1.778 m), weight 67.4 kg (148 lb 11.2 oz), SpO2 100 %.   Intake/Output Summary (Last 24 hours) at 04/08/16 0845 Last data filed at 04/08/16 0546  Gross per 24 hour  Intake          5144.06 ml   Output              450 ml  Net          4694.06 ml    Exam Awake Alert, Oriented x 3, No new F.N deficits, Normal affect [place].AT,PERRAL Supple Neck,No JVD, No cervical lymphadenopathy appriciated.  Symmetrical Chest wall movement, Good air movement bilaterally, CTAB RRR,No Gallops,Rubs or new Murmurs, No Parasternal Heave +ve B.Sounds, Abd Soft, Non tender, No organomegaly appriciated, No rebound -guarding or rigidity. No Cyanosis, Clubbing or edema, No new Rash or bruise  Data Review   CBC w Diff: Lab Results  Component Value Date   WBC 8.2 04/07/2016   HGB 12.4 (L) 04/07/2016   HGB 11.5 (L) 10/11/2012   HCT 36.8 (L) 04/07/2016   HCT 35.8 (L) 10/11/2012   PLT 265 04/07/2016   PLT 233 10/11/2012   LYMPHOPCT 18 10/16/2014   LYMPHOPCT 5.5 10/11/2012   MONOPCT 9 10/16/2014   MONOPCT 2.6 10/11/2012   EOSPCT 1 10/16/2014   EOSPCT 0.0 10/11/2012   BASOPCT 1 10/16/2014   BASOPCT 0.1 10/11/2012    CMP: Lab Results  Component Value Date  NA 139 04/08/2016   NA 137 10/11/2012   K 3.6 04/08/2016   K 4.2 10/11/2012   CL 108 04/08/2016   CL 105 10/11/2012   CO2 25 04/08/2016   CO2 26 10/11/2012   BUN 32 (H) 04/08/2016   BUN 17 10/11/2012   CREATININE 1.42 (H) 04/08/2016   CREATININE 1.36 (H) 10/11/2012   PROT 8.6 (H) 04/06/2016   ALBUMIN 4.1 04/06/2016   BILITOT 1.3 (H) 04/06/2016   ALKPHOS 74 04/06/2016   AST 21 04/06/2016   ALT 15 (L) 04/06/2016  .   Total Time in preparing paper work, data evaluation and todays exam - 73 minutes  Abrianna Sidman M.D on 04/08/2016 at 8:45 AM    Note: This dictation was prepared with Dragon dictation along with smaller phrase technology. Any transcriptional errors that result from this process are unintentional.

## 2016-04-08 NOTE — Progress Notes (Signed)
Inpatient Diabetes Program Recommendations  AACE/ADA: New Consensus Statement on Inpatient Glycemic Control (2015)  Target Ranges:  Prepandial:   less than 140 mg/dL      Peak postprandial:   less than 180 mg/dL (1-2 hours)      Critically ill patients:  140 - 180 mg/dL   Results for OMARION, MINNEHAN (MRN [id number]) as of 04/08/2016 08:09  Ref. Range 04/07/2016 06:17 04/07/2016 07:41 04/07/2016 09:07 04/07/2016 10:39 04/07/2016 11:44 04/07/2016 16:47 04/07/2016 21:17 04/08/2016 07:33  Glucose-Capillary Latest Ref Range: 65 - 99 mg/dL 177 (H) 168 (H) 108 (H) 128 (H) 157 (H) 340 (H) 220 (H) 103 (H)   Review of Glycemic Control  Current orders for Inpatient glycemic control: Lantus 12 units BID, Novolog 0-15 units TID with meals, Novolog 0-5 units QHS, Novolog 4 units TID with meals for meal coverage  Inpatient Diabetes Program Recommendations: Insulin - Meal Coverage: Please consider increasing meal coverage to Novolog 8 units TID with meals. Outpatient DM medications: Recommend MD re-evaluate outpatient DM medications and consider discharging on insulin dosages comparable to dosages being used as an inpatient. Please note patient has Levemir and Humalog at home.  Thanks, Barnie Alderman, RN, MSN, CDE Diabetes Coordinator Inpatient Diabetes Program [phone] (Team Pager from 8am to 5pm)

## 2016-04-08 NOTE — Care Management (Signed)
Admitted to [facility] with the diagnosis of DKA. Friend is Chrystie Nose. Sees Dr. Caryn Section as primary care physician. Medicaid application pending. Works at [employer]. Seen by Diabetic Coordination and Nutritional management prior to discharge. Discharge to home per Dr. Vianne Bulls. Shelbie Ammons RN MSN CCM Care Management

## 2016-04-12 LAB — CULTURE, BLOOD (ROUTINE X 2)
CULTURE: NO GROWTH
Culture: NO GROWTH

## 2016-05-10 ENCOUNTER — Telehealth: Payer: Self-pay | Admitting: Family Medicine

## 2016-05-10 NOTE — Telephone Encounter (Signed)
Please advise patient he is overdue for follow up of diabetes and needs hospital follow up scheduled.

## 2016-05-11 NOTE — Telephone Encounter (Signed)
Unable to reach pt at this time and unable to leave vm. Will try again later.

## 2016-05-18 NOTE — Telephone Encounter (Signed)
Unable to reach pt at this time and unable to leave vm. Will try again later.

## 2016-05-20 NOTE — Telephone Encounter (Signed)
Please advise 

## 2016-05-20 NOTE — Telephone Encounter (Signed)
Another attempt has been made to contact patient without luck.  Since it has been 10 days since Dr F initially wrote the message will you talk to him about what he wants to do. Thanks  ED

## 2016-09-07 ENCOUNTER — Telehealth: Payer: Self-pay | Admitting: Family Medicine

## 2016-09-07 NOTE — Telephone Encounter (Signed)
Pt contacted office to  request samples on the following medications:  CB# [phone]/MW  insulin aspart (NOVOLOG) 100 UNIT/ML injection  insulin glargine (LANTUS) 100 UNIT/ML injection

## 2016-09-07 NOTE — Telephone Encounter (Signed)
Please advise samples? 

## 2016-09-09 NOTE — Telephone Encounter (Signed)
He can have one  Basaglar pen which is the same thing as Lantus. We don't have any Novolog.

## 2016-09-10 NOTE — Telephone Encounter (Signed)
Pt returned Michelle's call. Pt was advised and voiced understanding. Pt stated he would come in today for the samples. Thanks TNP

## 2016-09-10 NOTE — Telephone Encounter (Signed)
Called pt to let him know that we have a sample of [place] waiting for him. No answer and unable to leave vm.

## 2017-01-12 ENCOUNTER — Ambulatory Visit: Payer: Self-pay | Admitting: Family Medicine

## 2017-01-20 ENCOUNTER — Encounter: Payer: Self-pay | Admitting: Family Medicine

## 2017-01-20 ENCOUNTER — Ambulatory Visit (INDEPENDENT_AMBULATORY_CARE_PROVIDER_SITE_OTHER): Payer: Self-pay | Admitting: Family Medicine

## 2017-01-20 VITALS — BP 152/84 | HR 95 | Temp 98.7°F | Resp 16 | Wt 178.0 lb

## 2017-01-20 DIAGNOSIS — E1165 Type 2 diabetes mellitus with hyperglycemia: Secondary | ICD-10-CM

## 2017-01-20 DIAGNOSIS — E1149 Type 2 diabetes mellitus with other diabetic neurological complication: Secondary | ICD-10-CM

## 2017-01-20 LAB — POCT GLYCOSYLATED HEMOGLOBIN (HGB A1C)
Est. average glucose Bld gHb Est-mCnc: 252
Hemoglobin A1C: 10.4

## 2017-01-20 MED ORDER — HYDROCHLOROTHIAZIDE 50 MG PO TABS: 50.0000 mg | ORAL_TABLET | Freq: Every day | ORAL | 1 refills | Status: DC

## 2017-01-20 NOTE — Progress Notes (Signed)
Patient: Nicholas Weiss Male    DOB: June 21, 1950   66 y.o.   MRN: [id number] Visit Date: 01/20/2017  Today's Provider: Lelon Huh, MD   Chief Complaint  Patient presents with  . Diabetes  . Hypertension   Subjective:    HPI   Diabetes Mellitus Type II, Follow-up:   Lab Results  Component Value Date   HGBA1C 12.3 (H) 04/06/2016   Last seen for diabetes 1 years ago.  Management since then includes; given samples of tresba. He reports fair compliance with treatment. Has been out of Novolog for 2-3 weeks. He is not having side effects.  Current symptoms include paresthesia of the feet and polyuria and have been worsening. Home blood sugar records: 240-260 in the evenings  Episodes of hypoglycemia? no   Current Insulin Regimen: Lantus 40 units at night Most Recent Eye Exam: >1 year ago Weight trend: increasing steadily Prior visit with dietician: no Current diet: diabetic Current exercise: none  ------------------------------------------------------------------------   Hypertension, follow-up:  BP Readings from Last 3 Encounters:  04/08/16 (!) 153/94  03/25/16 (!) 176/99  01/24/16 110/64    He was last seen for hypertension 1 years ago.  BP at that visit was 110/64. Management since that visit includes; no changes.He reports poor compliance with treatment. Has been out of HCTZ for the past 2-3 months He is not having side effects.  He is not exercising. He is adherent to low salt diet.   Outside blood pressures are checked at work and have been stable per patient report. He is experiencing none.  Patient denies chest pain, chest pressure/discomfort, claudication, dyspnea, exertional chest pressure/discomfort, fatigue, irregular heart beat, lower extremity edema, near-syncope, orthopnea, palpitations, paroxysmal nocturnal dyspnea, syncope and tachypnea.   Cardiovascular risk factors include advanced age (older than 41 for men, 12 for women),  hypertension and male gender.  Use of agents associated with hypertension: NSAIDS.   ------------------------------------------------------------------------     Allergies  Allergen Reactions  . Penicillins Other (See Comments)    Syncope  Has patient had a PCN reaction causing immediate rash, facial/tongue/throat swelling, SOB or lightheadedness with hypotension: No Has patient had a PCN reaction causing severe rash involving mucus membranes or skin necrosis: No Has patient had a PCN reaction that required hospitalization No Has patient had a PCN reaction occurring within the last 10 years: No If all of the above answers are "NO", then may proceed with Cephalosporin use.      Current Outpatient Prescriptions:  .  aspirin 81 MG chewable tablet, Chew 1 tablet by mouth daily., Disp: , Rfl:  .  insulin glargine (LANTUS) 100 UNIT/ML injection, Inject 0.12 mLs (12 Units total) into the skin 2 (two) times daily., Disp: 10 mL, Rfl: 11 .  lovastatin (MEVACOR) 40 MG tablet, Take 40 mg by mouth at bedtime. , Disp: , Rfl:  .  hydrochlorothiazide (HYDRODIURIL) 50 MG tablet, Take 1 tablet by mouth daily., Disp: , Rfl:  .  insulin aspart (NOVOLOG) 100 UNIT/ML injection, Inject 8 Units into the skin 3 (three) times daily with meals. (Patient not taking: Reported on 01/20/2017), Disp: 10 mL, Rfl: 11 .  insulin lispro (HUMALOG) 100 UNIT/ML injection, Inject 10 Units into the skin 3 (three) times daily as needed for high blood sugar. Per sliding scale , Disp: , Rfl:   Review of Systems  Constitutional: Negative for appetite change, chills and fever.  Respiratory: Negative for chest tightness, shortness of breath and wheezing.  Cardiovascular: Negative for chest pain and palpitations.  Gastrointestinal: Negative for abdominal pain, nausea and vomiting.  Endocrine: Positive for polyuria.  Neurological: Positive for numbness.    Social History  Substance Use Topics  . Smoking status: Never Smoker   . Smokeless tobacco: Never Used  . Alcohol use 0.6 oz/week    1 Cans of beer per week     Comment: weekly   Objective:   BP (!) 152/84 (BP Location: Right Arm, Cuff Size: Normal)   Pulse 95   Temp 98.7 F (37.1 C) (Oral)   Resp 16   Wt 178 lb (80.7 kg)   SpO2 98% Comment: room air  BMI 25.54 kg/m  Vitals:   01/20/17 0840 01/20/17 0844  BP: (!) 152/82 (!) 152/84  Pulse: 95   Resp: 16   Temp: 98.7 F (37.1 C)   TempSrc: Oral   SpO2: 98%   Weight: 178 lb (80.7 kg)      Physical Exam   General Appearance:    Alert, cooperative, no distress  Eyes:    PERRL, conjunctiva/corneas clear, EOM's intact       Lungs:     Clear to auscultation bilaterally, respirations unlabored  Heart:    Regular rate and rhythm  Neurologic:   Awake, alert, oriented x 3. No apparent focal neurological           defect.         Results for orders placed or performed in visit on 01/20/17  POCT HgB A1C  Result Value Ref Range   Hemoglobin A1C 10.4    Est. average glucose Bld gHb Est-mCnc 252        Assessment & Plan:      1. Uncontrolled type 2 diabetes mellitus with hyperglycemia ([place]) Fastings pretty well controlled on 40 units Lantus daily. Given 2 sample pens Lantus and 2 sample pens Humalog to restart sliding scale. Given contact information for medication management.  - POCT HgB A1C  2. Other diabetic neurological complication associated with type 2 diabetes mellitus ([place])  Return in about 3 months (around 04/22/2017).       Lelon Huh, MD  [facility]

## 2017-01-20 NOTE — Patient Instructions (Signed)
Check blood sugar before each meal      If blood sugar is >=150, but <200, take 8 units Humalog insulin before eating     If blood sugar is >=200, but <250, take 10 units Humalog insulin     If blood sugar is >=250, but <300, take 12 units Humalog insulin     If blood sugar is >=300, but <350, take 14 units Humalog insulin     If blood sugar is >=350, but <400, take 16 units Humalog insulin     If blood sugar is >400, take 18 units Humalog insulin

## 2017-04-15 ENCOUNTER — Telehealth: Payer: Self-pay | Admitting: Family Medicine

## 2017-04-15 NOTE — Telephone Encounter (Signed)
Advised pt samples are ready to pick-up.

## 2017-04-15 NOTE — Telephone Encounter (Signed)
He can have one vial of lantus and one vial of novolog

## 2017-04-15 NOTE — Telephone Encounter (Signed)
Please advise samples? 

## 2017-04-15 NOTE — Telephone Encounter (Signed)
Pt calling wanting to know if he can get simples for his insulin. Please return pt's call @ [phone]. Thanks CC

## 2017-04-22 ENCOUNTER — Encounter: Payer: Self-pay | Admitting: Family Medicine

## 2017-04-22 ENCOUNTER — Ambulatory Visit (INDEPENDENT_AMBULATORY_CARE_PROVIDER_SITE_OTHER): Payer: Self-pay | Admitting: Family Medicine

## 2017-04-22 VITALS — BP 120/70 | HR 89 | Temp 97.7°F | Resp 16 | Wt 177.0 lb

## 2017-04-22 DIAGNOSIS — Z23 Encounter for immunization: Secondary | ICD-10-CM

## 2017-04-22 DIAGNOSIS — E1149 Type 2 diabetes mellitus with other diabetic neurological complication: Secondary | ICD-10-CM

## 2017-04-22 DIAGNOSIS — Z1211 Encounter for screening for malignant neoplasm of colon: Secondary | ICD-10-CM

## 2017-04-22 LAB — POCT UA - MICROALBUMIN: Microalbumin Ur, POC: 20 mg/L

## 2017-04-22 LAB — POCT GLYCOSYLATED HEMOGLOBIN (HGB A1C)
Est. average glucose Bld gHb Est-mCnc: 263
Hemoglobin A1C: 10.8

## 2017-04-22 MED ORDER — LOVASTATIN 40 MG PO TABS: 40.0000 mg | ORAL_TABLET | Freq: Every day | ORAL | 4 refills | Status: DC

## 2017-04-22 MED ORDER — INSULIN GLARGINE 100 UNIT/ML ~~LOC~~ SOLN: 40.0000 [IU] | Freq: Every day | SUBCUTANEOUS | 1 refills | Status: DC

## 2017-04-22 NOTE — Progress Notes (Signed)
Patient: Nicholas Weiss Male    DOB: 1950/05/08   67 y.o.   MRN: [id number] Visit Date: 04/22/2017  Today's Provider: Lelon Huh, MD   Chief Complaint  Patient presents with  . Follow-up  . Diabetes  . Hypertension  . Hyperlipidemia   Subjective:    HPI   Diabetes Mellitus Type II, Follow-up:   Lab Results  Component Value Date   HGBA1C 10.8 04/22/2017   HGBA1C 10.4 01/20/2017   HGBA1C 12.3 (H) 04/06/2016   Last seen for diabetes 3 months ago.  Management since then includes; patient was given sample pens of Lantus and Humalog to restart sliding scale. He reports good compliance with treatment. He is having side effects. Tingling in feet and hands Current symptoms include none and have been unchanged. Home blood sugar records: fasting range: 145/220  Episodes of hypoglycemia? no   Current Insulin Regimen: Lantus 40 units every night and sliding scale novolog, but has cut Lantus back to 30 units the last few weeks.  Most Recent Eye Exam: 2 years ago Weight trend: stable Prior visit with dietician: no Current diet: well balanced Current exercise: some  ----------------------------------------------------------------    Hypertension, follow-up:  BP Readings from Last 3 Encounters:  04/22/17 120/70  01/20/17 (!) 152/84  04/08/16 (!) 153/94    He was last seen for hypertension 2 years ago.  BP at that visit was 110/64. Management since that visit includes; no cahnges.He reports good compliance with treatment. He is not having side effects. none He is exercising. He is not adherent to low salt diet.   Outside blood pressures are normal. He is experiencing none.  Patient denies none.   Cardiovascular risk factors include diabetes mellitus.  Use of agents associated with hypertension: none.   ----------------------------------------------------------------     Lipid/Cholesterol, Follow-up:   Last seen for this 2 years ago.  Management  since that visit includes; no changes.  Last Lipid Panel: No results found for: CHOL, TRIG, HDL, CHOLHDL, VLDL, LDLCALC, LDLDIRECT  He reports good compliance with treatment. He is not having side effects. none  Wt Readings from Last 3 Encounters:  04/22/17 177 lb (80.3 kg)  01/20/17 178 lb (80.7 kg)  04/07/16 148 lb 11.2 oz (67.4 kg)    ----------------------------------------------------------------     Allergies  Allergen Reactions  . Penicillins Other (See Comments)    Syncope  Has patient had a PCN reaction causing immediate rash, facial/tongue/throat swelling, SOB or lightheadedness with hypotension: No Has patient had a PCN reaction causing severe rash involving mucus membranes or skin necrosis: No Has patient had a PCN reaction that required hospitalization No Has patient had a PCN reaction occurring within the last 10 years: No If all of the above answers are "NO", then may proceed with Cephalosporin use.      Current Outpatient Medications:  .  aspirin 81 MG chewable tablet, Chew 1 tablet by mouth daily., Disp: , Rfl:  .  hydrochlorothiazide (HYDRODIURIL) 50 MG tablet, Take 1 tablet (50 mg total) by mouth daily., Disp: 90 tablet, Rfl: 1 .  insulin aspart (NOVOLOG) 100 UNIT/ML injection, Inject 8 Units into the skin 3 (three) times daily with meals., Disp: 10 mL, Rfl: 11 .  insulin glargine (LANTUS) 100 UNIT/ML injection, Inject 0.12 mLs (12 Units total) into the skin 2 (two) times daily. (Patient taking differently: Inject 40 Units into the skin 2 (two) times daily. ), Disp: 10 mL, Rfl: 11 .  insulin lispro (HUMALOG)  100 UNIT/ML injection, Inject 10 Units into the skin 3 (three) times daily as needed for high blood sugar. Per sliding scale , Disp: , Rfl:  .  lovastatin (MEVACOR) 40 MG tablet, Take 40 mg by mouth at bedtime. , Disp: , Rfl:   Review of Systems  Constitutional: Negative for appetite change, chills and fever.  Respiratory: Negative for chest tightness,  shortness of breath and wheezing.   Cardiovascular: Negative for chest pain and palpitations.  Gastrointestinal: Negative for abdominal pain, nausea and vomiting.    Social History   Tobacco Use  . Smoking status: Never Smoker  . Smokeless tobacco: Never Used  Substance Use Topics  . Alcohol use: Yes    Alcohol/week: 0.6 oz    Types: 1 Cans of beer per week    Comment: weekly   Objective:   BP 120/70 (BP Location: Right Arm, Patient Position: Sitting, Cuff Size: Large)   Pulse 89   Temp 97.7 F (36.5 C) (Oral)   Resp 16   Wt 177 lb (80.3 kg)   SpO2 95%   BMI 25.40 kg/m  Vitals:   04/22/17 0953  BP: 120/70  Pulse: 89  Resp: 16  Temp: 97.7 F (36.5 C)  TempSrc: Oral  SpO2: 95%  Weight: 177 lb (80.3 kg)     Physical Exam  General appearance: alert, well developed, well nourished, cooperative and in no distress Head: Normocephalic, without obvious abnormality, atraumatic Respiratory: Respirations even and unlabored, normal respiratory rate Extremities: No gross deformities Skin: Skin color, texture, turgor normal. No rashes seen  Psych: Appropriate mood and affect. Neurologic: Mental status: Alert, oriented to person, place, and time, thought content appropriate.  Results for orders placed or performed in visit on 04/22/17  POCT glycosylated hemoglobin (Hb A1C)  Result Value Ref Range   Hemoglobin A1C 10.8    Est. average glucose Bld gHb Est-mCnc 263   POCT UA - Microalbumin  Result Value Ref Range   Microalbumin Ur, POC 20 mg/L       Assessment & Plan:     1. Other diabetic neurological complication associated with type 2 diabetes mellitus ([place]) He provided inconsistent account regarding compliance with insulin regiment. Upon further questioning he states his fasting sugars are very good when he is able to get insulin. He does not have insurance so depends on samples of Lantus, so I don't think he is actually able to take it consistently. We doe have  samples today and was given two vials. Is is going to try to get on social security/Medicare. If not able to get pharmacy insurance then we could change to Novolin N.  - POCT glycosylated hemoglobin (Hb A1C) - POCT UA - Microalbumin  Recommended OTC vitamin B12 Refilled lovastatin  2. Colon cancer screening Given iFOBT collection kit.        Lelon Huh, MD  [facility]

## 2017-04-22 NOTE — Patient Instructions (Signed)
   Try OTC vitamin B12 1,000 mg once every day for diabetic neuropathy

## 2017-04-23 DIAGNOSIS — Z1211 Encounter for screening for malignant neoplasm of colon: Secondary | ICD-10-CM

## 2017-04-23 LAB — IFOBT (OCCULT BLOOD): IFOBT: NEGATIVE

## 2017-04-23 NOTE — Progress Notes (Signed)
Lab ordered and results in chart.

## 2017-04-23 NOTE — Progress Notes (Signed)
Test needs to be ordered and result entered into Epic so I get a result to sign off on. Thanks.  Df     Patient dropped off OC-Light. Results are negative.

## 2017-04-28 ENCOUNTER — Telehealth: Payer: Self-pay | Admitting: *Deleted

## 2017-04-28 NOTE — Telephone Encounter (Signed)
Left message for pt to return call.

## 2017-04-28 NOTE — Telephone Encounter (Signed)
-----   Message from Birdie Sons, MD sent at 04/23/2017  3:46 PM EST ----- Stool test is normal. Need to check once a year.

## 2017-04-29 NOTE — Telephone Encounter (Signed)
Tried calling patient. Left message to call back. 

## 2017-05-03 NOTE — Telephone Encounter (Signed)
Tried calling patient, no answer. Unable to contact the patient. Will save message to the chart.

## 2017-05-05 NOTE — Telephone Encounter (Signed)
Pt advised.   Thanks,   -Hamlin Devine  

## 2017-07-20 ENCOUNTER — Emergency Department
Admission: EM | Admit: 2017-07-20 | Discharge: 2017-07-20 | Disposition: A | Discharge status: Home / Self Care | Payer: Self-pay | Attending: Emergency Medicine | Admitting: Emergency Medicine

## 2017-07-20 ENCOUNTER — Emergency Department: Payer: Self-pay

## 2017-07-20 ENCOUNTER — Encounter: Payer: Self-pay | Admitting: Emergency Medicine

## 2017-07-20 ENCOUNTER — Other Ambulatory Visit: Payer: Self-pay

## 2017-07-20 DIAGNOSIS — R739 Hyperglycemia, unspecified: Secondary | ICD-10-CM

## 2017-07-20 DIAGNOSIS — R1012 Left upper quadrant pain: Secondary | ICD-10-CM | POA: Insufficient documentation

## 2017-07-20 DIAGNOSIS — E1140 Type 2 diabetes mellitus with diabetic neuropathy, unspecified: Secondary | ICD-10-CM | POA: Insufficient documentation

## 2017-07-20 DIAGNOSIS — E1165 Type 2 diabetes mellitus with hyperglycemia: Secondary | ICD-10-CM | POA: Insufficient documentation

## 2017-07-20 DIAGNOSIS — Z79899 Other long term (current) drug therapy: Secondary | ICD-10-CM | POA: Insufficient documentation

## 2017-07-20 DIAGNOSIS — Z794 Long term (current) use of insulin: Secondary | ICD-10-CM | POA: Insufficient documentation

## 2017-07-20 DIAGNOSIS — I10 Essential (primary) hypertension: Secondary | ICD-10-CM | POA: Insufficient documentation

## 2017-07-20 DIAGNOSIS — K2900 Acute gastritis without bleeding: Secondary | ICD-10-CM | POA: Insufficient documentation

## 2017-07-20 DIAGNOSIS — Z7982 Long term (current) use of aspirin: Secondary | ICD-10-CM | POA: Insufficient documentation

## 2017-07-20 DIAGNOSIS — E860 Dehydration: Secondary | ICD-10-CM

## 2017-07-20 LAB — COMPREHENSIVE METABOLIC PANEL
ALBUMIN: 4.2 g/dL (ref 3.5–5.0)
ALT: 15 U/L — AB (ref 17–63)
AST: 19 U/L (ref 15–41)
Alkaline Phosphatase: 68 U/L (ref 38–126)
Anion gap: 10 (ref 5–15)
BUN: 43 mg/dL — ABNORMAL HIGH (ref 6–20)
CHLORIDE: 94 mmol/L — AB (ref 101–111)
CO2: 25 mmol/L (ref 22–32)
CREATININE: 2.56 mg/dL — AB (ref 0.61–1.24)
Calcium: 8.8 mg/dL — ABNORMAL LOW (ref 8.9–10.3)
GFR calc Af Amer: 29 mL/min — ABNORMAL LOW (ref >60)
GFR calc non Af Amer: 25 mL/min — ABNORMAL LOW (ref >60)
GLUCOSE: 515 mg/dL — AB (ref 65–99)
POTASSIUM: 4.3 mmol/L (ref 3.5–5.1)
Sodium: 129 mmol/L — ABNORMAL LOW (ref 135–145)
Total Bilirubin: 0.6 mg/dL (ref 0.3–1.2)
Total Protein: 8.5 g/dL — ABNORMAL HIGH (ref 6.5–8.1)

## 2017-07-20 LAB — CBC
HEMATOCRIT: 38.3 % — AB (ref 40.0–52.0)
Hemoglobin: 12.9 g/dL — ABNORMAL LOW (ref 13.0–18.0)
MCH: 31.5 pg (ref 26.0–34.0)
MCHC: 33.8 g/dL (ref 32.0–36.0)
MCV: 93.4 fL (ref 80.0–100.0)
Platelets: 270 10*3/uL (ref 150–440)
RBC: 4.1 MIL/uL — ABNORMAL LOW (ref 4.40–5.90)
RDW: 13.1 % (ref 11.5–14.5)
WBC: 5.9 10*3/uL (ref 3.8–10.6)

## 2017-07-20 LAB — URINALYSIS, COMPLETE (UACMP) WITH MICROSCOPIC
Bilirubin Urine: NEGATIVE
Glucose, UA: >=500 mg/dL — AB
Hgb urine dipstick: NEGATIVE
Ketones, ur: 5 mg/dL — AB
LEUKOCYTES UA: NEGATIVE
Nitrite: NEGATIVE
PH: 5 (ref 5.0–8.0)
Protein, ur: NEGATIVE mg/dL
SPECIFIC GRAVITY, URINE: 1.021 (ref 1.005–1.030)

## 2017-07-20 LAB — BASIC METABOLIC PANEL
ANION GAP: 6 (ref 5–15)
BUN: 39 mg/dL — ABNORMAL HIGH (ref 6–20)
CALCIUM: 8.3 mg/dL — AB (ref 8.9–10.3)
CO2: 26 mmol/L (ref 22–32)
CREATININE: 1.85 mg/dL — AB (ref 0.61–1.24)
Chloride: 100 mmol/L — ABNORMAL LOW (ref 101–111)
GFR, EST AFRICAN AMERICAN: 43 mL/min — AB (ref >60)
GFR, EST NON AFRICAN AMERICAN: 37 mL/min — AB (ref >60)
Glucose, Bld: 315 mg/dL — ABNORMAL HIGH (ref 65–99)
Potassium: 4.2 mmol/L (ref 3.5–5.1)
Sodium: 132 mmol/L — ABNORMAL LOW (ref 135–145)

## 2017-07-20 LAB — GLUCOSE, CAPILLARY
GLUCOSE-CAPILLARY: 441 mg/dL — AB (ref 65–99)
GLUCOSE-CAPILLARY: 373 mg/dL — AB (ref 65–99)
Glucose-Capillary: 400 mg/dL — ABNORMAL HIGH (ref 65–99)
Glucose-Capillary: 275 mg/dL — ABNORMAL HIGH (ref 65–99)

## 2017-07-20 LAB — LIPASE, BLOOD: LIPASE: 30 U/L (ref 11–51)

## 2017-07-20 LAB — BLOOD GAS, VENOUS
Acid-base deficit: 2.4 mmol/L — ABNORMAL HIGH (ref 0.0–2.0)
BICARBONATE: 25.3 mmol/L (ref 20.0–28.0)
O2 SAT: 66.5 %
PCO2 VEN: 55 mmHg (ref 44.0–60.0)
PO2 VEN: 40 mmHg (ref 32.0–45.0)
Patient temperature: 37
pH, Ven: 7.27 (ref 7.250–7.430)

## 2017-07-20 IMAGING — CR DG CHEST 2V
1 series · 2 of 2 positions shown · non-contrast
Comparison: Chest radiograph and chest CT [DATE]

CLINICAL DATA: Cough and nausea

EXAM:
CHEST - 2 VIEW

[Series 1: dg chest 2 view · 0.14mm/px · 2 of 2 slices shown]
[im 1/2]
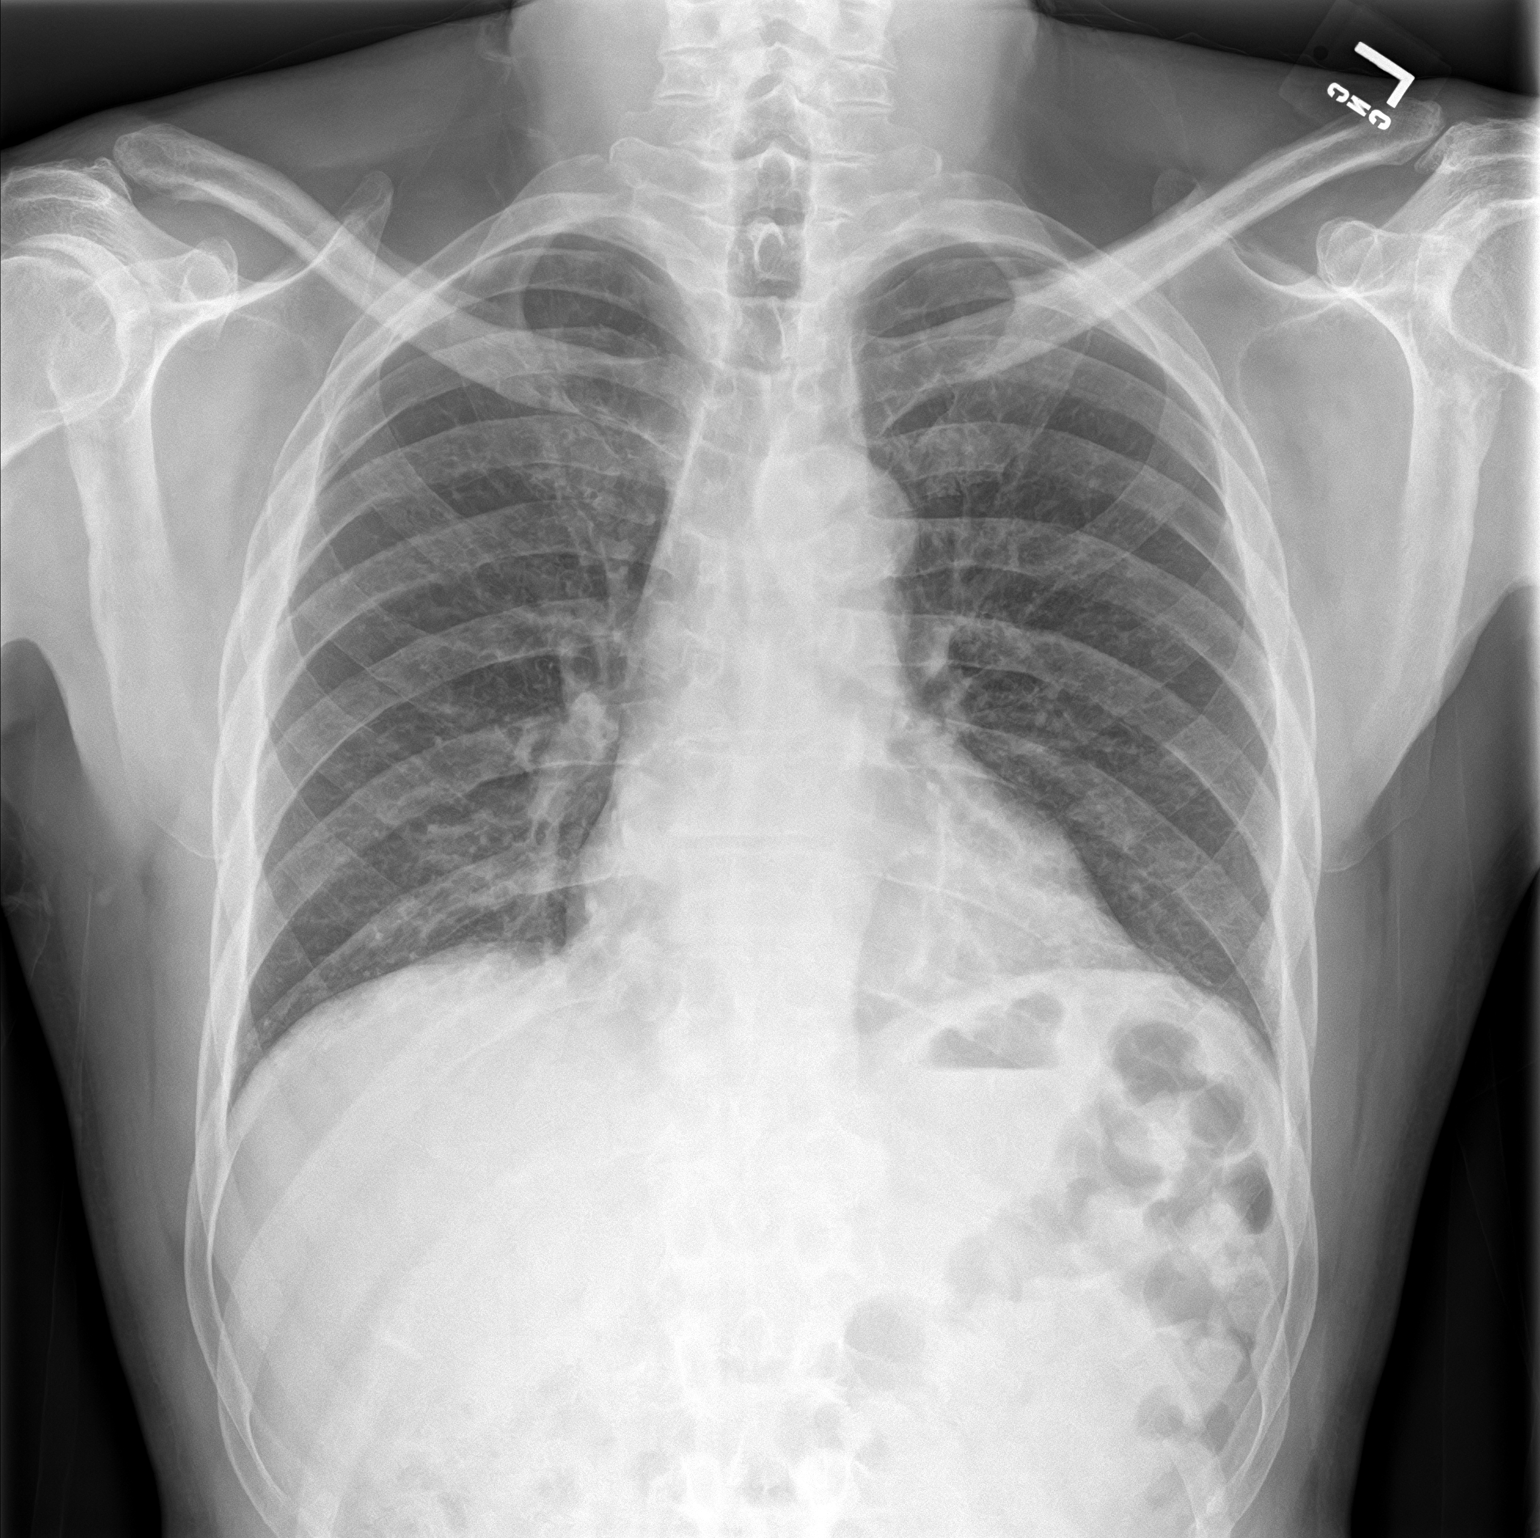
[im 2/2]
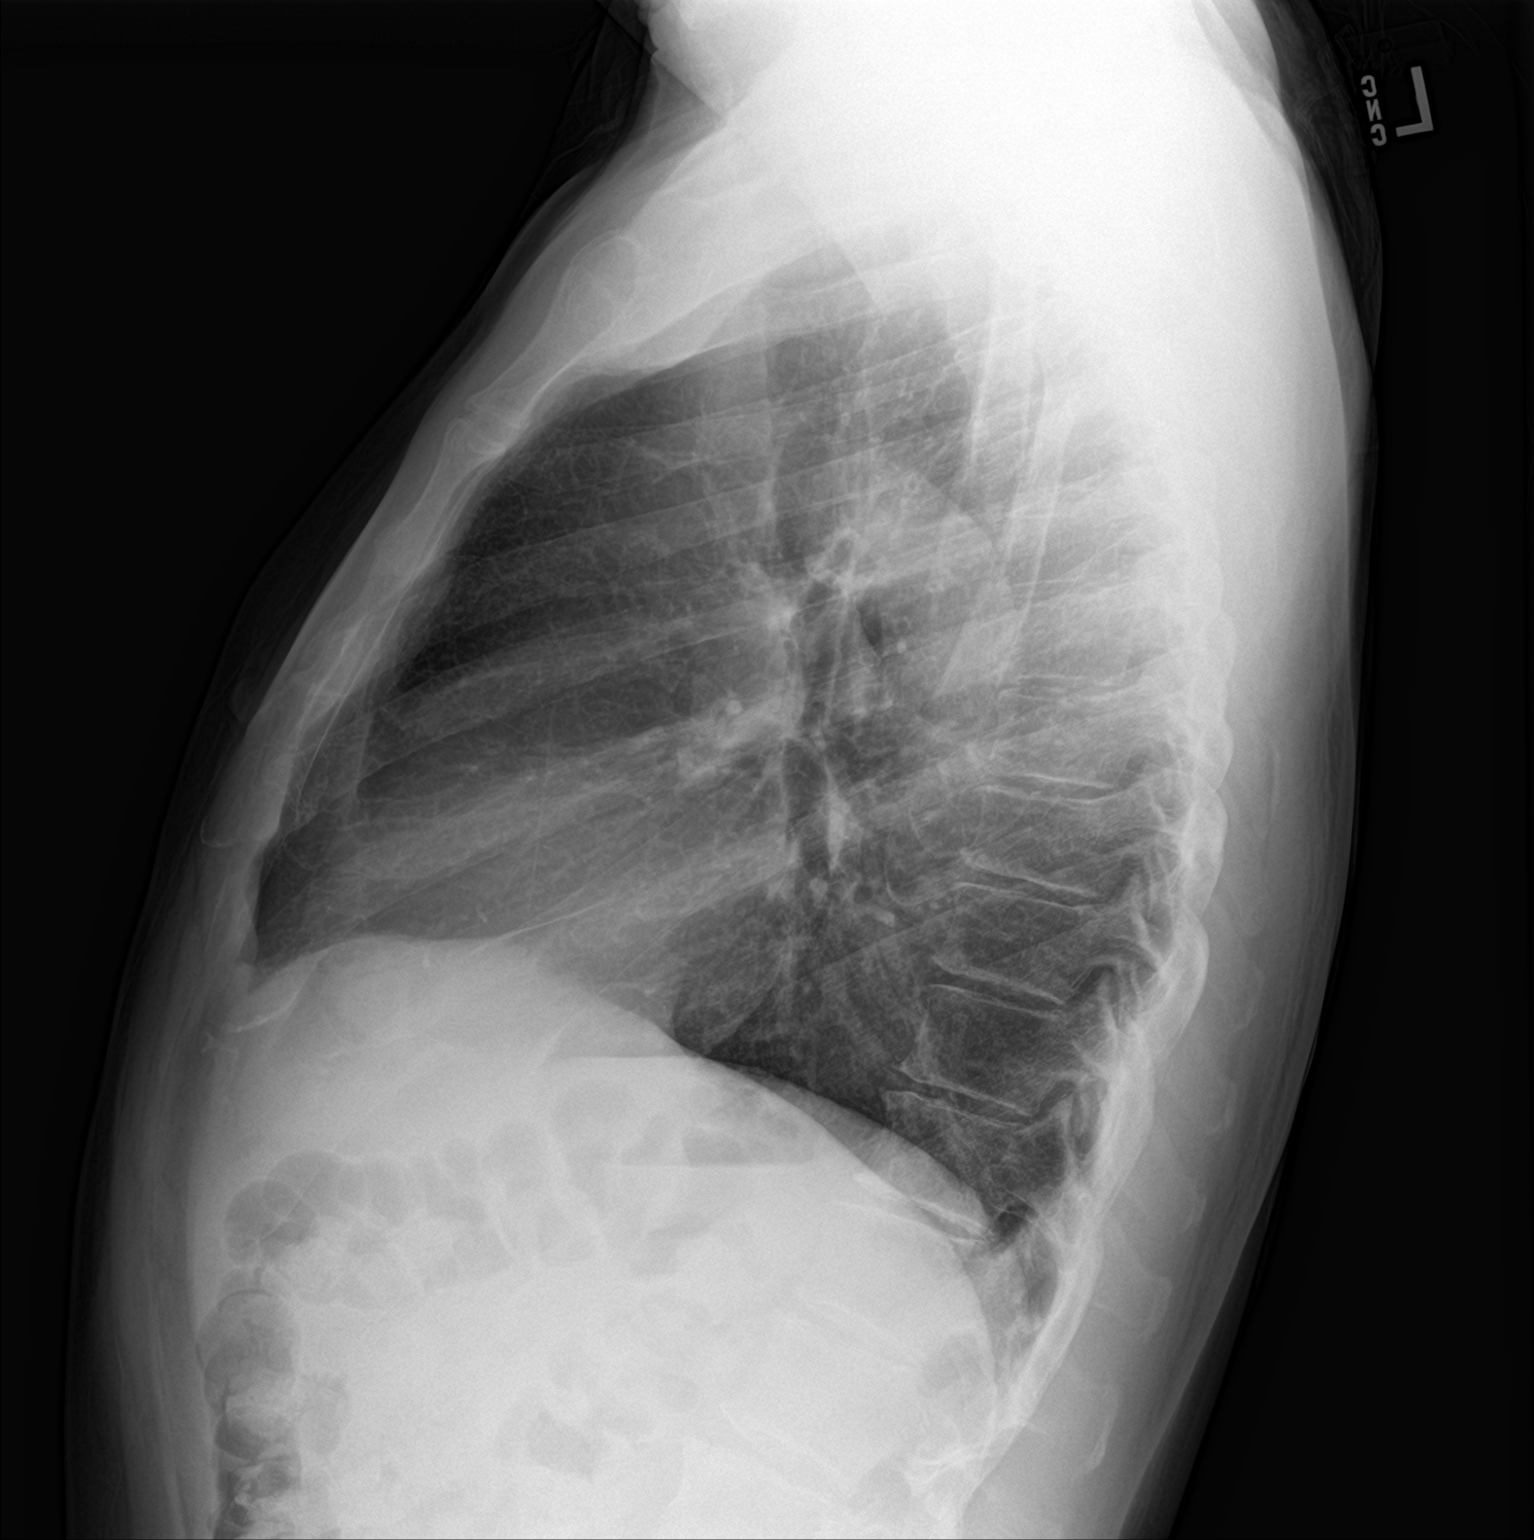

[2 of 2 positions shown; findings below may reference images not displayed]

FINDINGS: There is no appreciable edema or consolidation. Heart size and
pulmonary vascularity are normal. No adenopathy. No evident bone
lesions. There is aortic atherosclerosis.
IMPRESSION: Aortic atherosclerosis.  No edema or consolidation.

Aortic Atherosclerosis ([ZN]-[ZN]).

## 2017-07-20 MED ORDER — FAMOTIDINE IN NACL 20-0.9 MG/50ML-% IV SOLN
20.0000 mg | Freq: Once | INTRAVENOUS | Status: AC
  Administered 2017-07-20: 20 mg via INTRAVENOUS
  Filled 2017-07-20: qty 50

## 2017-07-20 MED ORDER — INSULIN ASPART 100 UNIT/ML ~~LOC~~ SOLN
8.0000 [IU] | Freq: Once | SUBCUTANEOUS | Status: AC
  Administered 2017-07-20: 8 [IU] via SUBCUTANEOUS
  Filled 2017-07-20: qty 1

## 2017-07-20 MED ORDER — LACTATED RINGERS IV BOLUS
2000.0000 mL | Freq: Once | INTRAVENOUS | Status: AC
  Administered 2017-07-20: 2000 mL via INTRAVENOUS

## 2017-07-20 MED ORDER — INSULIN GLARGINE 100 UNIT/ML ~~LOC~~ SOLN
20.0000 [IU] | Freq: Once | SUBCUTANEOUS | Status: AC
  Administered 2017-07-20: 20 [IU] via SUBCUTANEOUS
  Filled 2017-07-20: qty 0.2

## 2017-07-20 MED ORDER — ONDANSETRON HCL 4 MG/2ML IJ SOLN
4.0000 mg | Freq: Once | INTRAMUSCULAR | Status: AC
  Administered 2017-07-20: 4 mg via INTRAVENOUS
  Filled 2017-07-20: qty 2

## 2017-07-20 MED ORDER — ONDANSETRON 4 MG PO TBDP: 4.0000 mg | ORAL_TABLET | Freq: Three times a day (TID) | ORAL | 0 refills | Status: DC | PRN

## 2017-07-20 MED ORDER — METOCLOPRAMIDE HCL 10 MG PO TABS: 10.0000 mg | ORAL_TABLET | Freq: Four times a day (QID) | ORAL | 0 refills | Status: DC | PRN

## 2017-07-20 MED ORDER — CLONIDINE HCL 0.1 MG PO TABS
0.2000 mg | ORAL_TABLET | Freq: Once | ORAL | Status: AC
  Administered 2017-07-20: 0.2 mg via ORAL
  Filled 2017-07-20: qty 2

## 2017-07-20 MED ORDER — INSULIN ASPART 100 UNIT/ML ~~LOC~~ SOLN
6.0000 [IU] | Freq: Once | SUBCUTANEOUS | Status: AC
  Administered 2017-07-20: 6 [IU] via INTRAVENOUS
  Filled 2017-07-20: qty 1

## 2017-07-20 MED ORDER — DIPHENHYDRAMINE HCL 50 MG/ML IJ SOLN
25.0000 mg | Freq: Once | INTRAMUSCULAR | Status: AC
  Administered 2017-07-20: 25 mg via INTRAVENOUS
  Filled 2017-07-20: qty 1

## 2017-07-20 MED ORDER — FAMOTIDINE 20 MG PO TABS: 20.0000 mg | ORAL_TABLET | Freq: Two times a day (BID) | ORAL | 0 refills | Status: DC

## 2017-07-20 NOTE — ED Notes (Signed)
Pt given sandwich tray by RN

## 2017-07-20 NOTE — ED Triage Notes (Signed)
Patient ambulatory to triage with steady gait, without difficulty or distress noted; pt reports lower abd pain accomp by nausea since Friday

## 2017-07-20 NOTE — ED Notes (Signed)
Given diet ginger ale for PO challenge. Pt will call if has vomiting

## 2017-07-20 NOTE — ED Notes (Signed)
Patient transported to X-ray 

## 2017-07-20 NOTE — Discharge Instructions (Addendum)
Avoid alcohol for the next week.  Take your insulins as usual and follow up with your doctor this week.

## 2017-07-20 NOTE — ED Provider Notes (Signed)
[facility] Emergency Department Provider Note  ____________________________________________  Time seen: Approximately 8:15 AM  I have reviewed the triage vital signs and the nursing notes.   HISTORY  Chief Complaint Abdominal Pain    HPI Nicholas Weiss is a 67 y.o. male who complains of generalized abdominal pain nausea and vomiting for the past 4 days. He also when he went to a concert, tried to drink a shot of [place], after which she quickly had upset stomach and vomiting. Since then, he has been able to tolerate liquids but has continued to attempt taking alcohol which has provoked additional vomiting episodes. He's been compliant with his medications except that he forgot to take his Lantus last night. Denies fever or chills. Denies shortness of breath or chest pain. Abdominal pain is nonradiating, worse with alcohol, no alleviating factors. No black or bloody stool, no hematemesis.  patient also reports polyuria today.  Past Medical History:  Diagnosis Date  . Diabetes mellitus without complication ([place])   . Hypercholesteremia   . Hypertension      Patient Active Problem List   Diagnosis Date Noted  . DKA (diabetic ketoacidoses) ([place]) 04/06/2016  . Hypercholesterolemia 01/24/2016  . Essential (primary) hypertension 06/07/2006  . DM neuropathy with neurologic complication ([place]) 81/85/6314     Past Surgical History:  Procedure Laterality Date  . KNEE SURGERY Right    Torn meniscus  . KNEE SURGERY Left      Prior to Admission medications   Medication Sig Start Date End Date Taking? Authorizing Provider  aspirin 81 MG chewable tablet Chew 1 tablet by mouth daily.   Yes [provider]  hydrochlorothiazide (HYDRODIURIL) 50 MG tablet Take 1 tablet (50 mg total) by mouth daily. 01/20/17  Yes Birdie Sons, MD  insulin aspart (NOVOLOG) 100 UNIT/ML injection Inject 8 Units into the skin 3 (three) times daily with meals. 04/08/16  Yes  Epifanio Lesches, MD  insulin glargine (LANTUS) 100 UNIT/ML injection Inject 0.4 mLs (40 Units total) into the skin at bedtime. 04/22/17  Yes Birdie Sons, MD  lovastatin (MEVACOR) 40 MG tablet Take 1 tablet (40 mg total) by mouth at bedtime. 04/22/17  Yes Birdie Sons, MD  famotidine (PEPCID) 20 MG tablet Take 1 tablet (20 mg total) by mouth 2 (two) times daily. 07/20/17   Carrie Mew, MD  metoCLOPramide (REGLAN) 10 MG tablet Take 1 tablet (10 mg total) by mouth every 6 (six) hours as needed. 07/20/17   Carrie Mew, MD  ondansetron (ZOFRAN ODT) 4 MG disintegrating tablet Take 1 tablet (4 mg total) by mouth every 8 (eight) hours as needed for nausea or vomiting. 07/20/17   Carrie Mew, MD     Allergies Penicillins   Family History  Problem Relation Age of Onset  . Heart attack Father   . Hypertension Sister   . Cancer Sister     Social History Social History   Tobacco Use  . Smoking status: Never Smoker  . Smokeless tobacco: Never Used  Substance Use Topics  . Alcohol use: Yes    Alcohol/week: 0.6 oz    Types: 1 Cans of beer per week    Comment: weekly  . Drug use: Yes    Types: Marijuana    Comment: Last used 01/08/15    Review of Systems  Constitutional:   No fever or chills.  ENT:   No sore throat. No rhinorrhea. Cardiovascular:   No chest pain or syncope. Respiratory:   No dyspnea,  positive nonproductive cough. Gastrointestinal:   positive generalized abdominal pain with vomiting. No diarrhea, no constipation.a.  Musculoskeletal:   Negative for focal pain or swelling All other systems reviewed and are negative except as documented above in ROS and HPI.  ____________________________________________   PHYSICAL EXAM:  VITAL SIGNS: ED Triage Vitals [07/20/17 0535]  Enc Vitals Group     BP (!) 184/94     Pulse Rate (!) 105     Resp 18     Temp (!) 97.4 F (36.3 C)     Temp src      SpO2 94 %     Weight 170 lb (77.1 kg)     Height  5\' 10"  (1.778 m)     Head Circumference      Peak Flow      Pain Score 8     Pain Loc      Pain Edu?      Excl. in [place]?     Vital signs reviewed, nursing assessments reviewed.   Constitutional:   Alert and oriented. Well appearing and in no distress. Eyes:   Conjunctivae are normal. EOMI. PERRL. ENT      Head:   Normocephalic and atraumatic.      Nose:   No congestion/rhinnorhea.       Mouth/Throat:   MMM, no pharyngeal erythema. No peritonsillar mass.       Neck:   No meningismus. Full ROM. Hematological/Lymphatic/Immunilogical:   No cervical lymphadenopathy. Cardiovascular:   RRR. Symmetric bilateral radial and DP pulses.  No murmurs.  Respiratory:   Normal respiratory effort without tachypnea/retractions. Breath sounds are clear and equal bilaterally. No wheezes/rales/rhonchi. Gastrointestinal:   Soft without focal tenderness. Non distended. There is no CVA tenderness.  No rebound, rigidity, or guarding.  Musculoskeletal:   Normal range of motion in all extremities. No joint effusions.  No lower extremity tenderness.  No edema. Neurologic:   Normal speech and language.  Motor grossly intact. No acute focal neurologic deficits are appreciated.  Skin:    Skin is warm, dry and intact. No rash noted.  No petechiae, purpura, or bullae.  ____________________________________________    LABS (pertinent positives/negatives) (all labs ordered are listed, but only abnormal results are displayed) Labs Reviewed  COMPREHENSIVE METABOLIC PANEL - Abnormal; Notable for the following components:      Result Value   Sodium 129 (*)    Chloride 94 (*)    Glucose, Bld 515 (*)    BUN 43 (*)    Creatinine, Ser 2.56 (*)    Calcium 8.8 (*)    Total Protein 8.5 (*)    ALT 15 (*)    GFR calc non Af Amer 25 (*)    GFR calc Af Amer 29 (*)    All other components within normal limits  CBC - Abnormal; Notable for the following components:   RBC 4.10 (*)    Hemoglobin 12.9 (*)    HCT 38.3 (*)     All other components within normal limits  URINALYSIS, COMPLETE (UACMP) WITH MICROSCOPIC - Abnormal; Notable for the following components:   Color, Urine YELLOW (*)    APPearance CLEAR (*)    Glucose, UA >=500 (*)    Ketones, ur 5 (*)    All other components within normal limits  BLOOD GAS, VENOUS - Abnormal; Notable for the following components:   Acid-base deficit 2.4 (*)    All other components within normal limits  GLUCOSE, CAPILLARY - Abnormal; Notable for the  following components:   Glucose-Capillary 441 (*)    All other components within normal limits  GLUCOSE, CAPILLARY - Abnormal; Notable for the following components:   Glucose-Capillary 400 (*)    All other components within normal limits  BASIC METABOLIC PANEL - Abnormal; Notable for the following components:   Sodium 132 (*)    Chloride 100 (*)    Glucose, Bld 315 (*)    BUN 39 (*)    Creatinine, Ser 1.85 (*)    Calcium 8.3 (*)    GFR calc non Af Amer 37 (*)    GFR calc Af Amer 43 (*)    All other components within normal limits  GLUCOSE, CAPILLARY - Abnormal; Notable for the following components:   Glucose-Capillary 275 (*)    All other components within normal limits  GLUCOSE, CAPILLARY - Abnormal; Notable for the following components:   Glucose-Capillary 373 (*)    All other components within normal limits  LIPASE, BLOOD   ____________________________________________   EKG    ____________________________________________    RADIOLOGY  Dg Chest 2 View  Result Date: 07/20/2017 CLINICAL DATA:  Cough and nausea EXAM: CHEST - 2 VIEW COMPARISON:  Chest radiograph and chest CT January 12, 2015 FINDINGS: There is no appreciable edema or consolidation. Heart size and pulmonary vascularity are normal. No adenopathy. No evident bone lesions. There is aortic atherosclerosis. IMPRESSION: Aortic atherosclerosis.  No edema or consolidation. Aortic Atherosclerosis (ICD10-I70.0). Electronically Signed   By: Lowella Grip III M.D.   On: 07/20/2017 08:36    ____________________________________________   PROCEDURES Procedures  ____________________________________________  DIFFERENTIAL DIAGNOSIS   peptic ulcer disease, alcoholic gastritis, gastroparesis, dehydration secondary to hyperglycemia and osmotic diuresis.  CLINICAL IMPRESSION / ASSESSMENT AND PLAN / ED COURSE  Pertinent labs & imaging results that were available during my care of the patient were reviewed by me and considered in my medical decision making (see chart for details).    patient presents with abdominal pain and vomiting, likely alcoholic gastritis or related to underlying peptic ulcer disease aggravated by liquor intake. He also appears to be mildly dehydrated secondary to hyperglycemia. Give IV fluids for hydration, we'll give her half dose of his Lantus for better glycemic control. Labs are unremarkable and don't show any evidence of acidosis. No significant ketosis. I'll get a chest x-ray for his cough. If symptoms are improved with hydration, antacid therapy, he'll be suitable for discharge home.  Considering the patient's symptoms, medical history, and physical examination today, I have low suspicion for cholecystitis or biliary pathology, pancreatitis, perforation or bowel obstruction, hernia, intra-abdominal abscess, AAA or dissection, volvulus or intussusception, mesenteric ischemia, or appendicitis.    Clinical Course as of Jul 20 1332  Tue Jul 20, 2017  1249 Repeat bmp significantly improved.  Eating. Given AC insulin. Stable for DC home.   Creatinine(!): 1.85 [PS]    Clinical Course User Index [PS] Carrie Mew, MD     ____________________________________________   FINAL CLINICAL IMPRESSION(S) / ED DIAGNOSES    Final diagnoses:  Left upper quadrant pain  Acute gastritis without hemorrhage, unspecified gastritis type  Hyperglycemia  Dehydration     ED Discharge Orders        Ordered     ondansetron (ZOFRAN ODT) 4 MG disintegrating tablet  Every 8 hours PRN     07/20/17 1333    metoCLOPramide (REGLAN) 10 MG tablet  Every 6 hours PRN     07/20/17 1333    famotidine (PEPCID) 20 MG tablet  2 times daily     07/20/17 1333      Portions of this note were generated with dragon dictation software. Dictation errors may occur despite best attempts at proofreading.    Carrie Mew, MD 07/20/17 1334

## 2017-07-27 ENCOUNTER — Ambulatory Visit (INDEPENDENT_AMBULATORY_CARE_PROVIDER_SITE_OTHER): Payer: Self-pay | Admitting: Family Medicine

## 2017-07-27 ENCOUNTER — Encounter: Payer: Self-pay | Admitting: Family Medicine

## 2017-07-27 VITALS — BP 158/92 | HR 80 | Temp 97.8°F | Resp 16 | Wt 182.0 lb

## 2017-07-27 DIAGNOSIS — E1165 Type 2 diabetes mellitus with hyperglycemia: Secondary | ICD-10-CM

## 2017-07-27 DIAGNOSIS — K2970 Gastritis, unspecified, without bleeding: Secondary | ICD-10-CM

## 2017-07-27 DIAGNOSIS — E1149 Type 2 diabetes mellitus with other diabetic neurological complication: Secondary | ICD-10-CM

## 2017-07-27 DIAGNOSIS — I10 Essential (primary) hypertension: Secondary | ICD-10-CM

## 2017-07-27 LAB — POCT GLYCOSYLATED HEMOGLOBIN (HGB A1C)
Est. average glucose Bld gHb Est-mCnc: 272
Hemoglobin A1C: 11.1

## 2017-07-27 MED ORDER — INSULIN REGULAR HUMAN 100 UNIT/ML IJ SOLN: 8.0000 [IU] | Freq: Three times a day (TID) | INTRAMUSCULAR | 11 refills | Status: DC

## 2017-07-27 MED ORDER — GABAPENTIN 100 MG PO CAPS: ORAL_CAPSULE | ORAL | 5 refills | Status: DC

## 2017-07-27 MED ORDER — FAMOTIDINE 20 MG PO TABS: 20.0000 mg | ORAL_TABLET | Freq: Two times a day (BID) | ORAL | 3 refills | Status: DC

## 2017-07-27 MED ORDER — INSULIN NPH (HUMAN) (ISOPHANE) 100 UNIT/ML ~~LOC~~ SUSP: 20.0000 [IU] | Freq: Two times a day (BID) | SUBCUTANEOUS | 5 refills | Status: DC

## 2017-07-27 MED ORDER — INSULIN NPH (HUMAN) (ISOPHANE) 100 UNIT/ML ~~LOC~~ SUSP: SUBCUTANEOUS | 5 refills | Status: DC

## 2017-07-27 NOTE — Progress Notes (Signed)
Patient: Nicholas Weiss Male    DOB: 1950/04/14   67 y.o.   MRN: [id number] Visit Date: 07/27/2017  Today's Provider: Lelon Huh, MD   Chief Complaint  Patient presents with  . Follow-up  . Diabetes   Subjective:    HPI   Diabetes Mellitus Type II, Follow-up:   Lab Results  Component Value Date   HGBA1C 10.8 04/22/2017   HGBA1C 10.4 01/20/2017   HGBA1C 12.3 (H) 04/06/2016   Last seen for diabetes 3 months ago.  Management since then includes; patient was given samples of Lantus. He had no insurance and was encouraged to get on Banker or Pharmacy insurance. He reports good compliance with treatment. He is not having side effects.  Current symptoms include paresthesia of the feet and visual disturbances and have been stable. Home blood sugar records: fasting range: 96 this morning  Episodes of hypoglycemia? yes - 1-2 times a week   Current Insulin Regimen: see medication list Most Recent Eye Exam: >1 year ago Weight trend: increasing steadily Prior visit with dietician: no Current diet: in general, an "unhealthy" diet Current exercise: none  ------------------------------------------------------------------------   Follow up ER visit  Patient was seen in ER for LUQ abdominal pain, gastritis, Hyperglycemia, and Dehydration on 07/20/2017. He was treated for LUQ abdominal pain, gastritis, Hyperglycemia, and Dehydration. Treatment for this included; given IV fluids for hydration. Discharged home with rx for famotidine, metoclopramide, ondansetron. Glucose as high as 400,  He reports good compliance with treatment. He reports this condition is Improved.  ------------------------------------------------------------------------------------  He also complains of burning pain into his left first, second and third digits getting progressively worse over the last several month. Sometimes keeps up a night.    Allergies  Allergen Reactions  .  Penicillins Other (See Comments)    Syncope  Has patient had a PCN reaction causing immediate rash, facial/tongue/throat swelling, SOB or lightheadedness with hypotension: No Has patient had a PCN reaction causing severe rash involving mucus membranes or skin necrosis: No Has patient had a PCN reaction that required hospitalization No Has patient had a PCN reaction occurring within the last 10 years: No If all of the above answers are "NO", then may proceed with Cephalosporin use.      Current Outpatient Medications:  .  aspirin 81 MG chewable tablet, Chew 1 tablet by mouth daily., Disp: , Rfl:  .  famotidine (PEPCID) 20 MG tablet, Take 1 tablet (20 mg total) by mouth 2 (two) times daily., Disp: 60 tablet, Rfl: 0 .  hydrochlorothiazide (HYDRODIURIL) 50 MG tablet, Take 1 tablet (50 mg total) by mouth daily., Disp: 90 tablet, Rfl: 1 .  insulin aspart (NOVOLOG) 100 UNIT/ML injection, Inject 8 Units into the skin 3 (three) times daily with meals., Disp: 10 mL, Rfl: 11 .  insulin glargine (LANTUS) 100 UNIT/ML injection, Inject 0.4 mLs (40 Units total) into the skin at bedtime., Disp: 1 mL, Rfl: 1 .  lovastatin (MEVACOR) 40 MG tablet, Take 1 tablet (40 mg total) by mouth at bedtime., Disp: 90 tablet, Rfl: 4 .  metoCLOPramide (REGLAN) 10 MG tablet, Take 1 tablet (10 mg total) by mouth every 6 (six) hours as needed., Disp: 30 tablet, Rfl: 0 .  ondansetron (ZOFRAN ODT) 4 MG disintegrating tablet, Take 1 tablet (4 mg total) by mouth every 8 (eight) hours as needed for nausea or vomiting., Disp: 20 tablet, Rfl: 0  Review of Systems  Constitutional: Negative for appetite change, chills  and fever.  Eyes: Positive for visual disturbance (sometimes sees spots).  Respiratory: Negative for chest tightness, shortness of breath and wheezing.   Cardiovascular: Positive for leg swelling (in feet). Negative for chest pain and palpitations.  Gastrointestinal: Negative for abdominal pain, nausea and vomiting.    Endocrine: Positive for polyuria. Negative for polyphagia.  Neurological: Positive for numbness (in hands and feet) and headaches.    Social History   Tobacco Use  . Smoking status: Never Smoker  . Smokeless tobacco: Never Used  Substance Use Topics  . Alcohol use: Yes    Alcohol/week: 0.6 oz    Types: 1 Cans of beer per week    Comment: weekly   Objective:   BP (!) 158/92 (BP Location: Right Arm, Cuff Size: Large)   Pulse 80   Temp 97.8 F (36.6 C) (Oral)   Resp 16   Wt 182 lb (82.6 kg)   SpO2 99% Comment: room air  BMI 26.11 kg/m  Vitals:   07/27/17 1010 07/27/17 1013  BP: (!) 170/100 (!) 158/92  Pulse: 80   Resp: 16   Temp: 97.8 F (36.6 C)   TempSrc: Oral   SpO2: 99%   Weight: 182 lb (82.6 kg)      Physical Exam   General Appearance:    Alert, cooperative, no distress  Eyes:    PERRL, conjunctiva/corneas clear, EOM's intact       Lungs:     Clear to auscultation bilaterally, respirations unlabored  Heart:    Regular rate and rhythm  Neurologic:   Awake, alert, oriented x 3. No apparent focal neurological           defect. Negative Tinel's, negative Phalen's      Results for orders placed or performed in visit on 07/27/17  POCT HgB A1C  Result Value Ref Range   Hemoglobin A1C 11.1    Est. average glucose Bld gHb Est-mCnc 272        Assessment & Plan:     1. Uncontrolled type 2 diabetes mellitus with hyperglycemia ([place]) He has been relying of insulin samples to get by, but I don't think he is getting his insulin consistently, will change to generic NPH and insulin R which should be much more affordable. Advised of importance of taking insulin consistently. Will replace 40units lantus with 20 units daily NPH, but he is to call if he sees sugars over 250.  - POCT HgB A1C - insulin NPH Human (HUMULIN N,NOVOLIN N) 100 UNIT/ML injection; 20 units Daily at supper times  Dispense: 10 mL; Refill: 5 - insulin regular (NOVOLIN R RELION) 100 units/mL injection;  Inject 0.08 mLs (8 Units total) into the skin 3 (three) times daily before meals.  Dispense: 10 mL; Refill: 11  2. Other diabetic neurological complication associated with type 2 diabetes mellitus ([place]) start- gabapentin (NEURONTIN) 100 MG capsule; Take one at night for 4 days, then increase to two at night for 4 days, then increase to 3a night if needed.  Dispense: 90 capsule; Refill: 5  3. Essential (primary) hypertension Taking hctz inconsistently. He is to take every day and consider adding another medication if not better at follow up.   4. Gastritis without bleeding, unspecified chronicity, unspecified gastritis type He states improved with medications prescribed from ER which we will refill today.  - famotidine (PEPCID) 20 MG tablet; Take 1 tablet (20 mg total) by mouth 2 (two) times daily. As needed For stomach pains  Dispense: 60 tablet; Refill: 3  Lelon Huh, MD  [facility]

## 2017-07-27 NOTE — Patient Instructions (Signed)
   Start taking NPH insulin in the evening in  place of Lantus. Call me if your fasting blood sugars get over 250   Start taking Humulin R (regular insulin) in place of Humalog before meals.

## 2017-08-27 ENCOUNTER — Other Ambulatory Visit: Payer: Self-pay

## 2017-08-27 ENCOUNTER — Emergency Department: Payer: Self-pay

## 2017-08-27 ENCOUNTER — Emergency Department
Admission: EM | Admit: 2017-08-27 | Discharge: 2017-08-27 | Disposition: A | Discharge status: Home / Self Care | Payer: Self-pay | Attending: Student in an Organized Health Care Education/Training Program | Admitting: Student in an Organized Health Care Education/Training Program

## 2017-08-27 DIAGNOSIS — Z7982 Long term (current) use of aspirin: Secondary | ICD-10-CM | POA: Insufficient documentation

## 2017-08-27 DIAGNOSIS — E119 Type 2 diabetes mellitus without complications: Secondary | ICD-10-CM | POA: Insufficient documentation

## 2017-08-27 DIAGNOSIS — M25472 Effusion, left ankle: Secondary | ICD-10-CM | POA: Insufficient documentation

## 2017-08-27 DIAGNOSIS — I10 Essential (primary) hypertension: Secondary | ICD-10-CM | POA: Insufficient documentation

## 2017-08-27 DIAGNOSIS — E1140 Type 2 diabetes mellitus with diabetic neuropathy, unspecified: Secondary | ICD-10-CM | POA: Insufficient documentation

## 2017-08-27 DIAGNOSIS — Z794 Long term (current) use of insulin: Secondary | ICD-10-CM | POA: Insufficient documentation

## 2017-08-27 LAB — COMPREHENSIVE METABOLIC PANEL
ALK PHOS: 58 U/L (ref 38–126)
ALT: 12 U/L — ABNORMAL LOW (ref 17–63)
ANION GAP: 8 (ref 5–15)
AST: 16 U/L (ref 15–41)
Albumin: 3.9 g/dL (ref 3.5–5.0)
BUN: 19 mg/dL (ref 6–20)
CALCIUM: 8.9 mg/dL (ref 8.9–10.3)
CO2: 25 mmol/L (ref 22–32)
Chloride: 101 mmol/L (ref 101–111)
Creatinine, Ser: 1.35 mg/dL — ABNORMAL HIGH (ref 0.61–1.24)
GFR, EST NON AFRICAN AMERICAN: 54 mL/min — AB (ref >60)
Glucose, Bld: 254 mg/dL — ABNORMAL HIGH (ref 65–99)
Potassium: 4 mmol/L (ref 3.5–5.1)
SODIUM: 134 mmol/L — AB (ref 135–145)
Total Bilirubin: 0.5 mg/dL (ref 0.3–1.2)
Total Protein: 8.2 g/dL — ABNORMAL HIGH (ref 6.5–8.1)

## 2017-08-27 LAB — CBC
HCT: 36.9 % — ABNORMAL LOW (ref 40.0–52.0)
Hemoglobin: 12.3 g/dL — ABNORMAL LOW (ref 13.0–18.0)
MCH: 30.7 pg (ref 26.0–34.0)
MCHC: 33.3 g/dL (ref 32.0–36.0)
MCV: 92 fL (ref 80.0–100.0)
PLATELETS: 261 10*3/uL (ref 150–440)
RBC: 4.01 MIL/uL — ABNORMAL LOW (ref 4.40–5.90)
RDW: 13.6 % (ref 11.5–14.5)
WBC: 4 10*3/uL (ref 3.8–10.6)

## 2017-08-27 LAB — URIC ACID: URIC ACID, SERUM: 7.2 mg/dL (ref 4.4–7.6)

## 2017-08-27 IMAGING — DX DG ANKLE COMPLETE 3+V*L*
3 series · 3 of 3 positions shown · non-contrast
Comparison: None.

CLINICAL DATA: Left ankle swelling for 1 week.  No trauma.

EXAM:
LEFT ANKLE COMPLETE - 3+ VIEW

[ankle ap]
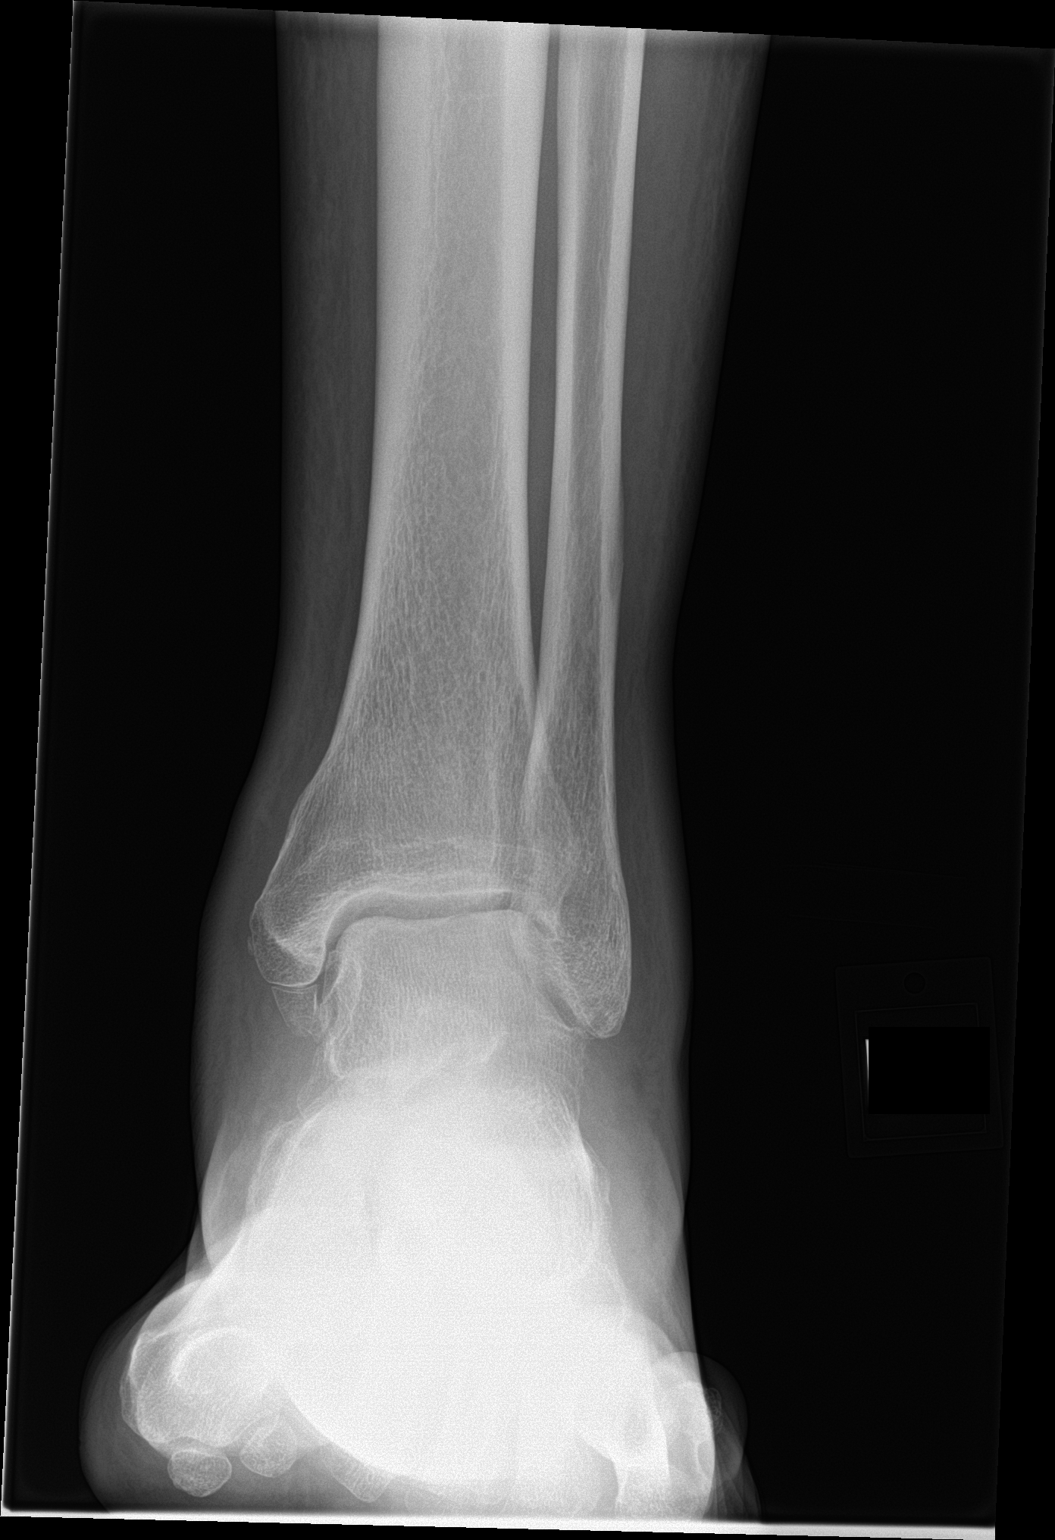

[ankle obl]
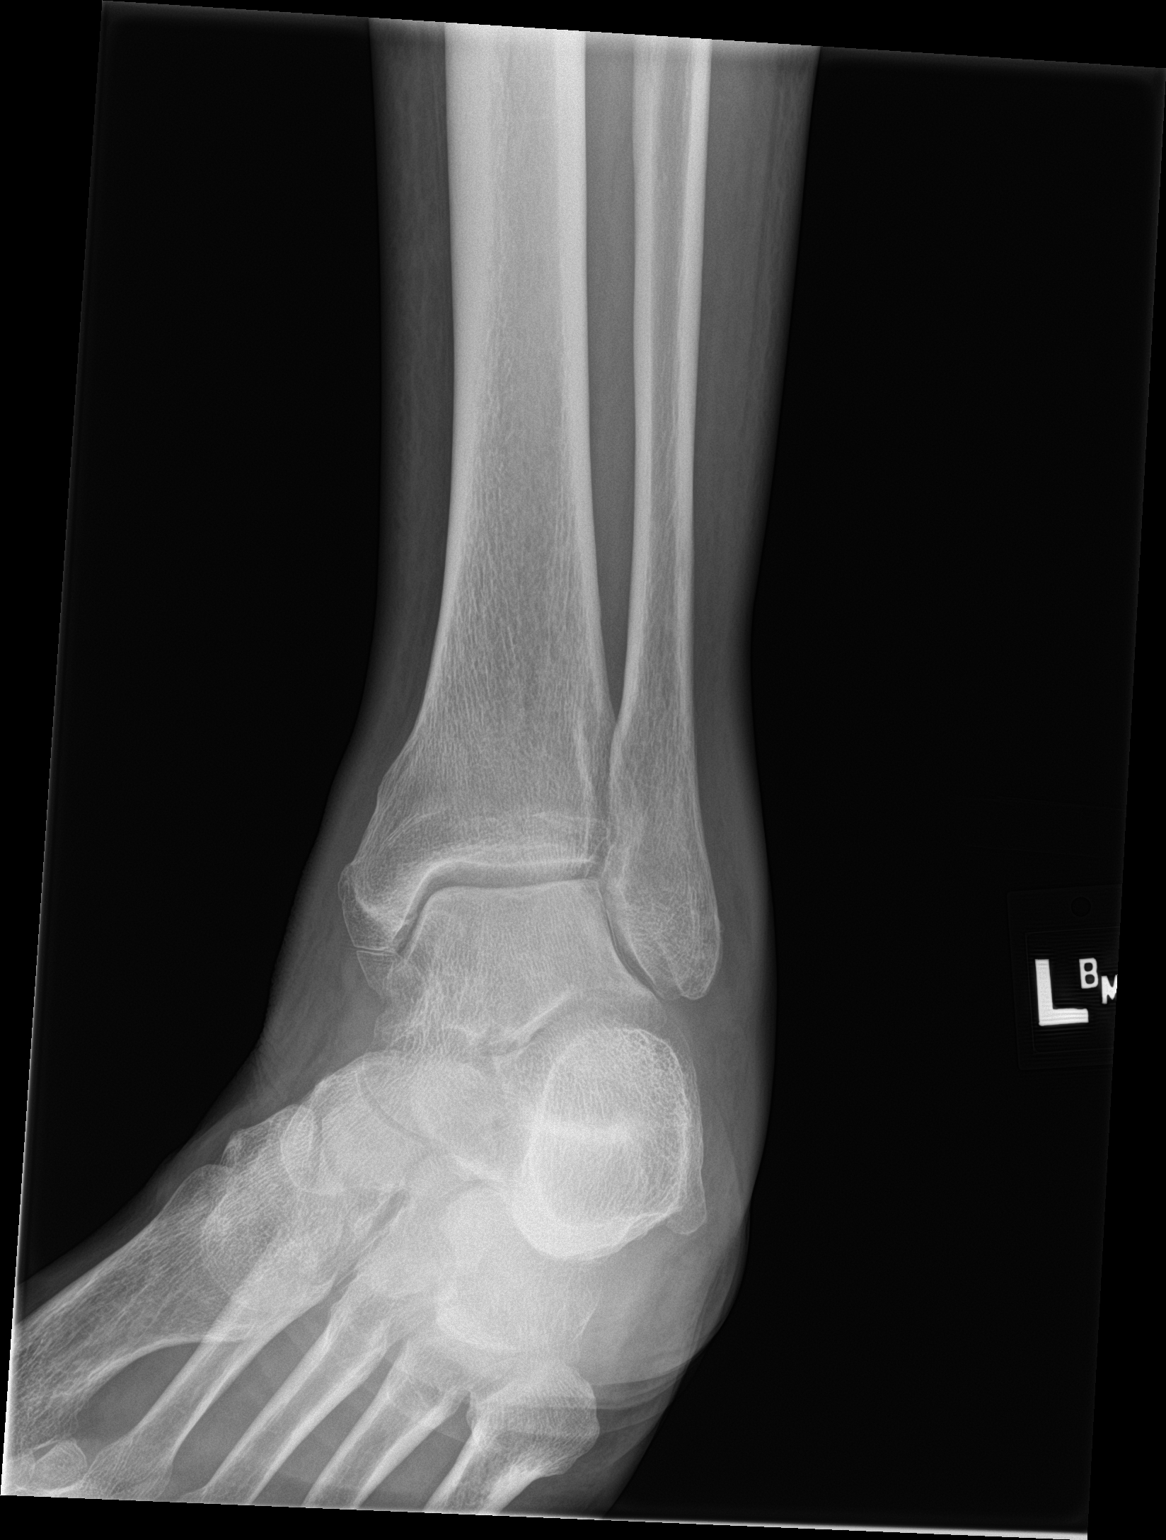

[ankle lat]
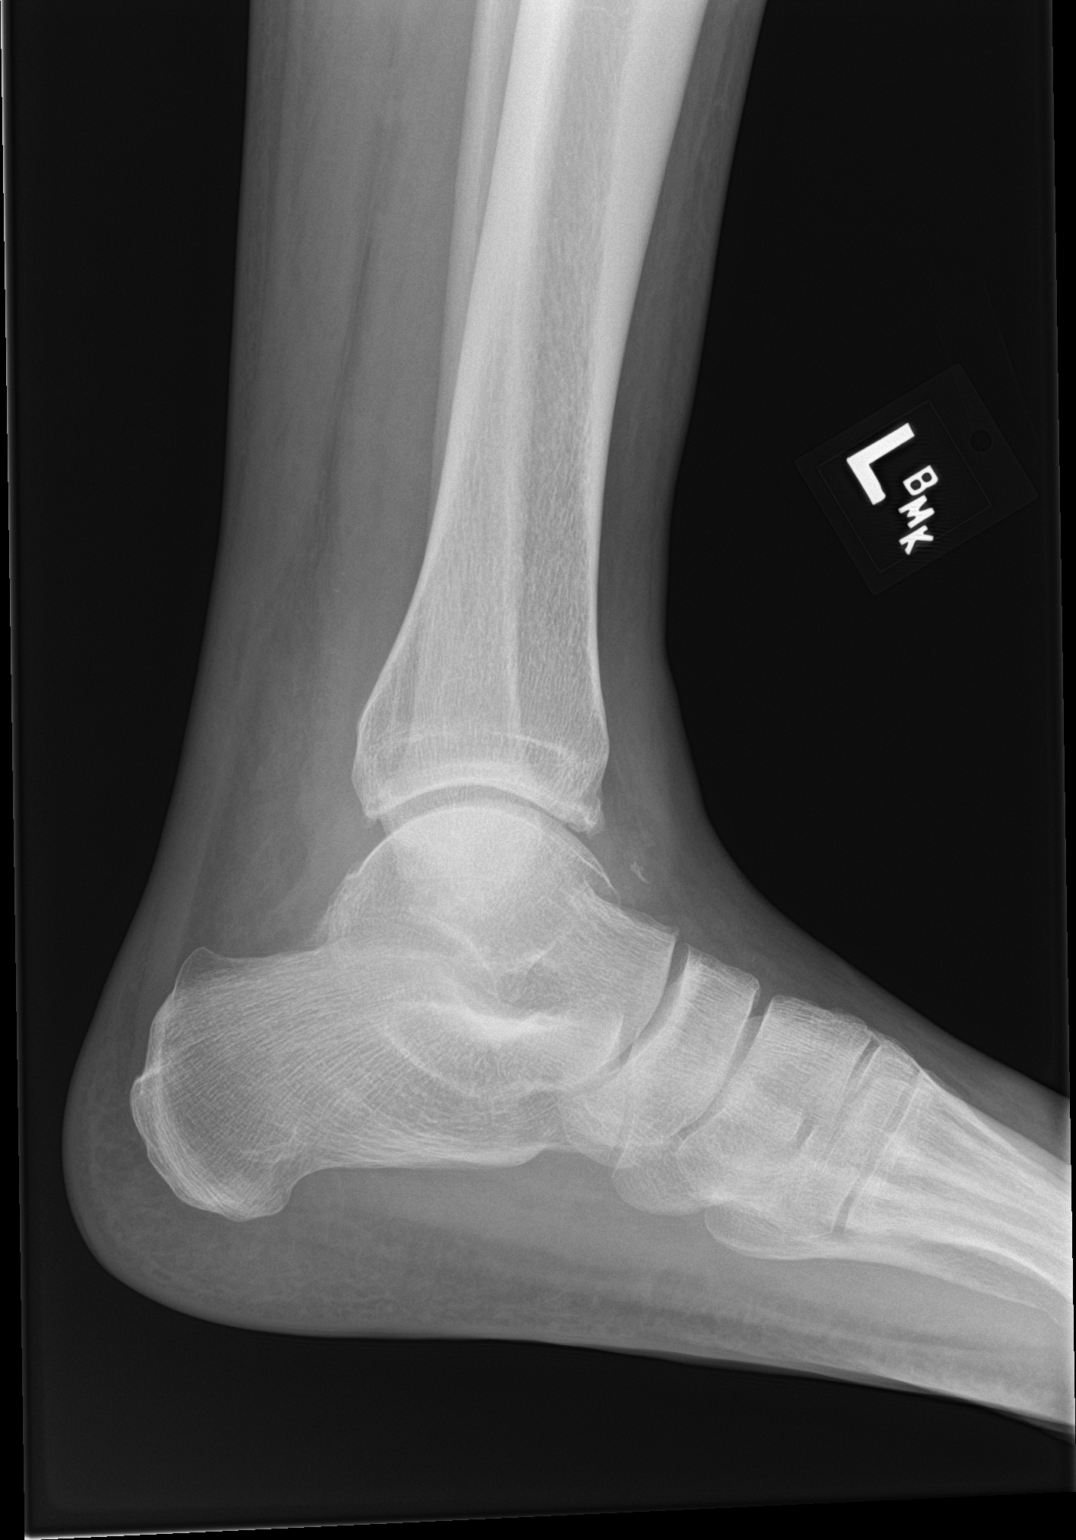

[3 of 3 positions shown; findings below may reference images not displayed]

FINDINGS: Well corticated calcification distal to the medial malleolus is
consistent with previous avulsion injury. Bilateral soft tissue
swelling identified. No acute fracture noted. A linear calcification
anterior to the ankle on the lateral view is likely from a previous
injury, possibly an avulsion injury. No other acute abnormalities.
IMPRESSION: No evidence of acute fracture. No dislocation. Soft tissue swelling.

## 2017-08-27 MED ORDER — DICLOFENAC SODIUM 1 % TD GEL: 2.0000 g | Freq: Four times a day (QID) | TRANSDERMAL | 0 refills | Status: DC

## 2017-08-27 MED ORDER — DICLOFENAC SODIUM 1 % TD GEL: 2.0000 g | Freq: Two times a day (BID) | TRANSDERMAL | Status: DC

## 2017-08-27 NOTE — ED Provider Notes (Signed)
[facility] Emergency Department Provider Note  ____________________________________________  Time seen: Approximately 10:48 PM  I have reviewed the triage vital signs and the nursing notes.   HISTORY  Chief Complaint Joint Swelling    HPI Nicholas Weiss is a 67 y.o. male that presents to the emergency department for evaluation of left ankle swelling for 4 days and discolored toenail for an unknown amount of time.  Patient denies any trauma.  He states that ankle swelled in his past when he would play basketball.  He has some numbness and tingling in his toes, which he says is normal with his diabetes.  He does not smoke.  He denies fever, chills, shortness of breath, chest pain, nausea, vomiting, abdominal pain.  Past Medical History:  Diagnosis Date  . Diabetes mellitus without complication ([place])   . Hypercholesteremia   . Hypertension     Patient Active Problem List   Diagnosis Date Noted  . DKA (diabetic ketoacidoses) ([place]) 04/06/2016  . Hypercholesterolemia 01/24/2016  . Essential (primary) hypertension 06/07/2006  . DM neuropathy with neurologic complication ([place]) 09/98/3382    Past Surgical History:  Procedure Laterality Date  . KNEE SURGERY Right    Torn meniscus  . KNEE SURGERY Left     Prior to Admission medications   Medication Sig Start Date End Date Taking? Authorizing Provider  aspirin 81 MG chewable tablet Chew 1 tablet by mouth daily.    [provider]  diclofenac sodium (VOLTAREN) 1 % GEL Apply 2 g topically 4 (four) times daily. 08/27/17   Laban Emperor, PA-C  famotidine (PEPCID) 20 MG tablet Take 1 tablet (20 mg total) by mouth 2 (two) times daily. As needed For stomach pains 07/27/17   Birdie Sons, MD  gabapentin (NEURONTIN) 100 MG capsule Take one at night for 4 days, then increase to two at night for 4 days, then increase to 3a night if needed. 07/27/17   Birdie Sons, MD  hydrochlorothiazide (HYDRODIURIL) 50  MG tablet Take 1 tablet (50 mg total) by mouth daily. 01/20/17   Birdie Sons, MD  insulin NPH Human (HUMULIN N,NOVOLIN N) 100 UNIT/ML injection 20 units Daily at supper times 07/27/17   Birdie Sons, MD  insulin regular (NOVOLIN R RELION) 100 units/mL injection Inject 0.08 mLs (8 Units total) into the skin 3 (three) times daily before meals. 07/27/17   Birdie Sons, MD  lovastatin (MEVACOR) 40 MG tablet Take 1 tablet (40 mg total) by mouth at bedtime. 04/22/17   Birdie Sons, MD    Allergies Penicillins  Family History  Problem Relation Age of Onset  . Heart attack Father   . Hypertension Sister   . Cancer Sister     Social History Social History   Tobacco Use  . Smoking status: Never Smoker  . Smokeless tobacco: Never Used  Substance Use Topics  . Alcohol use: Yes    Alcohol/week: 0.6 oz    Types: 1 Cans of beer per week    Comment: weekly  . Drug use: Yes    Types: Marijuana    Comment: Last used 01/08/15     Review of Systems  Constitutional: No fever/chills Cardiovascular: No chest pain. Respiratory: No SOB. Gastrointestinal: No abdominal pain.  No nausea, no vomiting.  Musculoskeletal: Positive for ankle pain. Skin: Negative for rash, abrasions, lacerations, ecchymosis. Neurological: Negative for headaches.   ____________________________________________   PHYSICAL EXAM:  VITAL SIGNS: ED Triage Vitals [08/27/17 1952]  Enc Vitals  Group     BP (!) 181/102     Pulse Rate 74     Resp 16     Temp 98.2 F (36.8 C)     Temp Source Oral     SpO2 98 %     Weight 178 lb (80.7 kg)     Height 5\' 10"  (1.778 m)     Head Circumference      Peak Flow      Pain Score 6     Pain Loc      Pain Edu?      Excl. in [place]?      Constitutional: Alert and oriented. Well appearing and in no acute distress. Eyes: Conjunctivae are normal. PERRL. EOMI. Head: Atraumatic. ENT:      Ears:      Nose: No congestion/rhinnorhea.      Mouth/Throat: Mucous  membranes are moist.  Neck: No stridor.  Cardiovascular: Normal rate, regular rhythm.  Good peripheral circulation.  Symmetric dorsalis pedis pulses bilaterally. Respiratory: Normal respiratory effort without tachypnea or retractions. Lungs CTAB. Good air entry to the bases with no decreased or absent breath sounds. Musculoskeletal: Full range of motion to all extremities. No gross deformities appreciated.  1+ nonpitting edema to left ankle.  Mild tenderness to palpation over superior lateral and medial malleolus. No calf or foot tenderness.  Neurologic:  Normal speech and language. No gross focal neurologic deficits are appreciated.  Skin:  Skin is warm, dry and intact.  Yellow right great toenail. Psychiatric: Mood and affect are normal. Speech and behavior are normal. Patient exhibits appropriate insight and judgement.   ____________________________________________   LABS (all labs ordered are listed, but only abnormal results are displayed)  Labs Reviewed  CBC - Abnormal; Notable for the following components:      Result Value   RBC 4.01 (*)    Hemoglobin 12.3 (*)    HCT 36.9 (*)    All other components within normal limits  COMPREHENSIVE METABOLIC PANEL - Abnormal; Notable for the following components:   Sodium 134 (*)    Glucose, Bld 254 (*)    Creatinine, Ser 1.35 (*)    Total Protein 8.2 (*)    ALT 12 (*)    GFR calc non Af Amer 54 (*)    All other components within normal limits  URIC ACID   ____________________________________________  EKG   ____________________________________________  RADIOLOGY Robinette Haines, personally viewed and evaluated these images (plain radiographs) as part of my medical decision making, as well as reviewing the written report by the radiologist.  Dg Ankle Complete Left  Result Date: 08/27/2017 CLINICAL DATA:  Left ankle swelling for 1 week.  No trauma. EXAM: LEFT ANKLE COMPLETE - 3+ VIEW COMPARISON:  None. FINDINGS: Well corticated  calcification distal to the medial malleolus is consistent with previous avulsion injury. Bilateral soft tissue swelling identified. No acute fracture noted. A linear calcification anterior to the ankle on the lateral view is likely from a previous injury, possibly an avulsion injury. No other acute abnormalities. IMPRESSION: No evidence of acute fracture. No dislocation. Soft tissue swelling. Electronically Signed   By: Dorise Bullion III M.D   On: 08/27/2017 22:31    ____________________________________________    PROCEDURES  Procedure(s) performed:    Procedures    Medications - No data to display   ____________________________________________   INITIAL IMPRESSION / ASSESSMENT AND PLAN / ED COURSE  Pertinent labs & imaging results that were available during my care of  the patient were reviewed by me and considered in my medical decision making (see chart for details).  Review of the [place] CSRS was performed in accordance of the [place] prior to dispensing any controlled drugs.    Patient presented to the emergency department for evaluation of ankle pain and swelling for 4 days.  Vital signs and exam are reassuring.  No acute injury on x-ray.  Labwork consistent with previous. Ankle was ace wrapped. Patient will be discharged home with prescriptions for voltaren gel. Patient is to follow up with PCP as directed. Patient is given ED precautions to return to the ED for any worsening or new symptoms.     ____________________________________________  FINAL CLINICAL IMPRESSION(S) / ED DIAGNOSES  Final diagnoses:  Left ankle swelling      NEW MEDICATIONS STARTED DURING THIS VISIT:  ED Discharge Orders        Ordered    diclofenac sodium (VOLTAREN) 1 % GEL  4 times daily     08/27/17 2324          This chart was dictated using voice recognition software/Dragon. Despite best efforts to proofread, errors can occur which can change the meaning. Any change was purely  unintentional.    Laban Emperor, PA-C 08/28/17 0009    Merlyn Lot, MD 08/28/17 0010

## 2017-08-27 NOTE — ED Triage Notes (Signed)
Pt states has left ankle swelling for one week. Pt states "my toenail is green too". Pt states pain is present "a little bit" with ambulation, no known injury. No redness noted on exam. Great toenail is discolored, but no drainage present.

## 2017-09-27 ENCOUNTER — Encounter: Payer: Self-pay | Admitting: Family Medicine

## 2017-09-27 ENCOUNTER — Ambulatory Visit (INDEPENDENT_AMBULATORY_CARE_PROVIDER_SITE_OTHER): Payer: Self-pay | Admitting: Family Medicine

## 2017-09-27 VITALS — BP 122/74 | HR 84 | Temp 98.2°F | Resp 16 | Ht 70.0 in | Wt 173.0 lb

## 2017-09-27 DIAGNOSIS — E1165 Type 2 diabetes mellitus with hyperglycemia: Secondary | ICD-10-CM

## 2017-09-27 LAB — POCT GLYCOSYLATED HEMOGLOBIN (HGB A1C)
ESTIMATED AVERAGE GLUCOSE: 275
Hemoglobin A1C: 11.2 % — AB (ref 4.0–5.6)

## 2017-09-27 MED ORDER — INSULIN REGULAR HUMAN 100 UNIT/ML IJ SOLN: 4.0000 [IU] | Freq: Three times a day (TID) | INTRAMUSCULAR | 11 refills | Status: DC

## 2017-09-27 MED ORDER — INSULIN NPH (HUMAN) (ISOPHANE) 100 UNIT/ML ~~LOC~~ SUSP: SUBCUTANEOUS | 5 refills | Status: DC

## 2017-09-27 MED ORDER — LOVASTATIN 40 MG PO TABS: 40.0000 mg | ORAL_TABLET | Freq: Every day | ORAL | 4 refills | Status: DC

## 2017-09-27 NOTE — Patient Instructions (Signed)
Check blood sugar before each meal      If blood sugar is >=150, but <250, take 4 units Regular insulin     If blood sugar is >=250, but <300, take 6 units Regular insulin     If blood sugar is >=300, but <350, take 8 units Regular insulin     If blood sugar is >=350, but <400, take 10 units Regular insulin     If blood sugar is >400, take 12 units Regular insulin

## 2017-09-27 NOTE — Progress Notes (Signed)
Patient: Nicholas Weiss Male    DOB: May 08, 1950   67 y.o.   MRN: [id number] Visit Date: 09/27/2017  Today's Provider: Lelon Huh, MD   Chief Complaint  Patient presents with  . Follow-up  . Diabetes  . Hypertension   Subjective:    HPI   Diabetes Mellitus Type II, Follow-up:   Lab Results  Component Value Date   HGBA1C 11.2 (A) 09/27/2017   HGBA1C 11.1 07/27/2017   HGBA1C 10.8 04/22/2017   Last seen for diabetes 2 months ago.  Management since then includes; changed to generic NPH and Insulin R which should be more affordable. Replaced 40 units Lantus with 20 units daily NPH, however, he states he still has some Lantus and novolog so he hasn't changed medications yet.  He reports good compliance with treatment. He is not having side effects. none Current symptoms include none and have been unchanged. Home blood sugar records: fasting range: 80-110  Episodes of hypoglycemia? no    Most Recent Eye Exam: due Weight trend: decreasing steadily Prior visit with dietician: no Current diet: in general, a "healthy" diet   Current exercise: none  ----------------------------------------------------------------   Hypertension, follow-up:  BP Readings from Last 3 Encounters:  09/27/17 122/74  08/27/17 (!) 166/88  07/27/17 (!) 158/92    He was last seen for hypertension 2 months ago.  BP at that visit was 170/100. Management since that visit includes; counseled patient to take mediation consistently qd.He reports good compliance with treatment. He is not having side effects. none He is not exercising. He is not adherent to low salt diet.   Outside blood pressures are 160/90. He is experiencing none.  Patient denies none.   Cardiovascular risk factors include diabetes mellitus.  Use of agents associated with hypertension: none.   ----------------------------------------------------------------   Other diabetic neurological complication associated with  type 2 diabetes mellitus ([place]) From 07/27/2017-started gabapentin (NEURONTIN) 100 MG capsule.    Allergies  Allergen Reactions  . Penicillins Other (See Comments)    Syncope  Has patient had a PCN reaction causing immediate rash, facial/tongue/throat swelling, SOB or lightheadedness with hypotension: No Has patient had a PCN reaction causing severe rash involving mucus membranes or skin necrosis: No Has patient had a PCN reaction that required hospitalization No Has patient had a PCN reaction occurring within the last 10 years: No If all of the above answers are "NO", then may proceed with Cephalosporin use.      Current Outpatient Medications:  .  aspirin 81 MG chewable tablet, Chew 1 tablet by mouth daily., Disp: , Rfl:  .  diclofenac sodium (VOLTAREN) 1 % GEL, Apply 2 g topically 4 (four) times daily., Disp: 100 g, Rfl: 0 .  famotidine (PEPCID) 20 MG tablet, Take 1 tablet (20 mg total) by mouth 2 (two) times daily. As needed For stomach pains, Disp: 60 tablet, Rfl: 3 .  gabapentin (NEURONTIN) 100 MG capsule, Take one at night for 4 days, then increase to two at night for 4 days, then increase to 3a night if needed., Disp: 90 capsule, Rfl: 5 .  hydrochlorothiazide (HYDRODIURIL) 50 MG tablet, Take 1 tablet (50 mg total) by mouth daily., Disp: 90 tablet, Rfl: 1 .  insulin NPH Human (HUMULIN N,NOVOLIN N) 100 UNIT/ML injection, 20 units Daily at supper times, Disp: 10 mL, Rfl: 5 .  insulin regular (NOVOLIN R RELION) 100 units/mL injection, Inject 0.08 mLs (8 Units total) into the skin 3 (three) times daily  before meals., Disp: 10 mL, Rfl: 11 .  lovastatin (MEVACOR) 40 MG tablet, Take 1 tablet (40 mg total) by mouth at bedtime., Disp: 90 tablet, Rfl: 4  Review of Systems  Constitutional: Negative for appetite change, chills and fever.  Respiratory: Negative for chest tightness, shortness of breath and wheezing.   Cardiovascular: Negative for chest pain and palpitations.  Gastrointestinal:  Negative for abdominal pain, nausea and vomiting.    Social History   Tobacco Use  . Smoking status: Never Smoker  . Smokeless tobacco: Never Used  Substance Use Topics  . Alcohol use: Yes    Alcohol/week: 0.6 oz    Types: 1 Cans of beer per week    Comment: weekly   Objective:   BP 122/74 (BP Location: Right Arm, Patient Position: Sitting, Cuff Size: Large)   Pulse 84   Temp 98.2 F (36.8 C) (Oral)   Resp 16   Ht 5\' 10"  (1.778 m)   Wt 173 lb (78.5 kg)   SpO2 98%   BMI 24.82 kg/m     Physical Exam  General appearance: alert, well developed, well nourished, cooperative and in no distress Head: Normocephalic, without obvious abnormality, atraumatic Respiratory: Respirations even and unlabored, normal respiratory rate Extremities: No gross deformities Skin: Skin color, texture, turgor normal. No rashes seen  Psych: Appropriate mood and affect. Neurologic: Mental status: Alert, oriented to person, place, and time, thought content appropriate. Results for orders placed or performed in visit on 09/27/17  POCT glycosylated hemoglobin (Hb A1C)  Result Value Ref Range   Hemoglobin A1C 11.2 (A) 4.0 - 5.6 %   HbA1c POC (<> result, manual entry)  4.0 - 5.6 %   HbA1c, POC (prediabetic range)  5.7 - 6.4 %   HbA1c, POC (controlled diabetic range)  0.0 - 7.0 %   Est. average glucose Bld gHb Est-mCnc 275        Assessment & Plan:     1. Uncontrolled type 2 diabetes mellitus with hyperglycemia (HCC) Change from Lantus and Novolog to generic- insulin NPH Human (HUMULIN N,NOVOLIN N) 100 UNIT/ML injection; 20 units Daily at supper times  Dispense: 10 mL; Refill: 5  - insulin regular (NOVOLIN R RELION) 100 units/mL injection; Inject 0.04-0.12 mLs (4-12 Units total) into the skin 3 (three) times daily before meals. According to sliding scale  Dispense: 10 mL; Refill: 11  Return in about 3 months (around 12/28/2017).       Lelon Huh, MD  [facility]

## 2018-01-03 ENCOUNTER — Ambulatory Visit (INDEPENDENT_AMBULATORY_CARE_PROVIDER_SITE_OTHER): Payer: Self-pay | Admitting: Family Medicine

## 2018-01-03 ENCOUNTER — Encounter: Payer: Self-pay | Admitting: Family Medicine

## 2018-01-03 VITALS — BP 124/74 | HR 94 | Temp 97.7°F | Wt 169.0 lb

## 2018-01-03 DIAGNOSIS — Z23 Encounter for immunization: Secondary | ICD-10-CM

## 2018-01-03 DIAGNOSIS — E1142 Type 2 diabetes mellitus with diabetic polyneuropathy: Secondary | ICD-10-CM

## 2018-01-03 DIAGNOSIS — E1165 Type 2 diabetes mellitus with hyperglycemia: Secondary | ICD-10-CM

## 2018-01-03 LAB — POCT GLYCOSYLATED HEMOGLOBIN (HGB A1C): Hemoglobin A1C: 11.2 % — AB (ref 4.0–5.6)

## 2018-01-03 MED ORDER — AMITRIPTYLINE HCL 10 MG PO TABS: ORAL_TABLET | ORAL | 4 refills | Status: DC

## 2018-01-03 MED ORDER — INSULIN NPH (HUMAN) (ISOPHANE) 100 UNIT/ML ~~LOC~~ SUSP: SUBCUTANEOUS | 5 refills | Status: DC

## 2018-01-03 MED ORDER — INSULIN REGULAR HUMAN 100 UNIT/ML IJ SOLN
4.0000 [IU] | Freq: Three times a day (TID) | INTRAMUSCULAR | 11 refills | Status: DC
Start: 2018-01-03 — End: 2019-07-28

## 2018-01-03 NOTE — Progress Notes (Signed)
Patient: Nicholas Weiss Male    DOB: 1951-03-08   67 y.o.   MRN: [id number] Visit Date: 01/03/2018  Today's Provider: Lelon Huh, MD   Chief Complaint  Patient presents with  . Diabetes  . Hypertension  . Hyperlipidemia   Subjective:    HPI    Diabetes Mellitus Type II, Follow-up:   Lab Results  Component Value Date   HGBA1C 11.2 (A) 09/27/2017   HGBA1C 11.1 07/27/2017   HGBA1C 10.8 04/22/2017   Last seen for diabetes 3 months ago.  Management since then includes changing lantus to NPH due to cost of basal insulin. Marland Kitchen He reports good compliance with treatment. He is not having side effects.  Current symptoms include paresthesia of the feet and have been worsening. Home blood sugar records: fasting range: 170-180's  Episodes of hypoglycemia? yes - pt says he has 2-3 lows a week.   Current Insulin Regimen: either 20 units NPH or 40 units Lantus every evening. And SS humulin R before before meals.  Most Recent Eye Exam: Pt has an appointment this month for an eye exam Weight trend: decreasing steadily Prior visit with dietician: no Current diet: in general, a "healthy" diet  , diabetic Current exercise: walking  ------------------------------------------------------------------------   Hypertension, follow-up:  BP Readings from Last 3 Encounters:  01/03/18 124/74  09/27/17 122/74  08/27/17 (!) 166/88    He was last seen for hypertension 3 months ago.   He is experiencing none.  Patient denies chest pain, chest pressure/discomfort, irregular heart beat and near-syncope.   Cardiovascular risk factors include diabetes mellitus.  Use of agents associated with hypertension: none.   ------------------------------------------------------------------------     Allergies  Allergen Reactions  . Penicillins Other (See Comments)    Syncope  Has patient had a PCN reaction causing immediate rash, facial/tongue/throat swelling, SOB or lightheadedness with  hypotension: No Has patient had a PCN reaction causing severe rash involving mucus membranes or skin necrosis: No Has patient had a PCN reaction that required hospitalization No Has patient had a PCN reaction occurring within the last 10 years: No If all of the above answers are "NO", then may proceed with Cephalosporin use.      Current Outpatient Medications:  .  aspirin 81 MG chewable tablet, Chew 1 tablet by mouth daily., Disp: , Rfl:  .  diclofenac sodium (VOLTAREN) 1 % GEL, Apply 2 g topically 4 (four) times daily., Disp: 100 g, Rfl: 0 .  famotidine (PEPCID) 20 MG tablet, Take 1 tablet (20 mg total) by mouth 2 (two) times daily. As needed For stomach pains, Disp: 60 tablet, Rfl: 3 .  gabapentin (NEURONTIN) 100 MG capsule, Take one at night for 4 days, then increase to two at night for 4 days, then increase to 3a night if needed., Disp: 90 capsule, Rfl: 5 .  hydrochlorothiazide (HYDRODIURIL) 50 MG tablet, Take 1 tablet (50 mg total) by mouth daily., Disp: 90 tablet, Rfl: 1 .  insulin NPH Human (HUMULIN N,NOVOLIN N) 100 UNIT/ML injection, 20 units Daily at supper times, Disp: 10 mL, Rfl: 5 .  insulin regular (NOVOLIN R RELION) 100 units/mL injection, Inject 0.04-0.12 mLs (4-12 Units total) into the skin 3 (three) times daily before meals. According to sliding scale, Disp: 10 mL, Rfl: 11 .  lovastatin (MEVACOR) 40 MG tablet, Take 1 tablet (40 mg total) by mouth at bedtime., Disp: 90 tablet, Rfl: 4  Review of Systems  Constitutional: Negative.   Respiratory: Negative.  Cardiovascular: Positive for leg swelling (Left ankle swells.). Negative for chest pain and palpitations.  Gastrointestinal: Negative.   Endocrine: Negative.   Musculoskeletal: Negative.   Neurological: Positive for numbness. Negative for dizziness, light-headedness and headaches.    Social History   Tobacco Use  . Smoking status: Never Smoker  . Smokeless tobacco: Never Used  Substance Use Topics  . Alcohol use:  Yes    Alcohol/week: 1.0 standard drinks    Types: 1 Cans of beer per week    Comment: weekly   Objective:   BP 124/74 (BP Location: Right Arm, Patient Position: Sitting, Cuff Size: Normal)   Pulse 94   Temp 97.7 F (36.5 C) (Oral)   Wt 169 lb (76.7 kg)   SpO2 99%   BMI 24.25 kg/m  Vitals:   01/03/18 1115  BP: 124/74  Pulse: 94  Temp: 97.7 F (36.5 C)  TempSrc: Oral  SpO2: 99%  Weight: 169 lb (76.7 kg)     Physical Exam   General Appearance:    Alert, cooperative, no distress  Eyes:    PERRL, conjunctiva/corneas clear, EOM's intact       Lungs:     Clear to auscultation bilaterally, respirations unlabored  Heart:    Regular rate and rhythm  Neurologic:   Awake, alert, oriented x 3. No apparent focal neurological           defect.       Results for orders placed or performed in visit on 01/03/18  POCT glycosylated hemoglobin (Hb A1C)  Result Value Ref Range   Hemoglobin A1C 11.2 (A) 4.0 - 5.6 %       Assessment & Plan:     1. Uncontrolled type 2 diabetes mellitus with hyperglycemia (HCC) Increase NPH to 20 units twice a day and reminded not to take both NPH and Lantus. Continue SS humulin r during the day.  - POCT glycosylated hemoglobin (Hb A1C) - insulin NPH Human (HUMULIN N,NOVOLIN N) 100 UNIT/ML injection; 20 units twice a day  Dispense: 10 mL; Refill: 5 - insulin regular (NOVOLIN R RELION) 100 units/mL injection; Inject 0.04-0.12 mLs (4-12 Units total) into the skin 3 (three) times daily before meals. According to sliding scale  Dispense: 10 mL; Refill: 11  2. Need for influenza vaccination  - Flu Vaccine QUAD 6+ mos PF IM (Fluarix Quad PF)   3. Diabetic polyneuropathy associated with type 2 diabetes mellitus (HCC) Having persistent tingling in hands and feet, but no numbness or burning.         Lelon Huh, MD  [facility]

## 2018-04-01 ENCOUNTER — Other Ambulatory Visit: Payer: Self-pay

## 2018-04-01 ENCOUNTER — Encounter: Payer: Self-pay | Admitting: *Deleted

## 2018-04-01 ENCOUNTER — Emergency Department
Admission: EM | Admit: 2018-04-01 | Discharge: 2018-04-02 | Disposition: A | Discharge status: Home / Self Care | Payer: Medicare Other | Attending: Emergency Medicine | Admitting: Emergency Medicine

## 2018-04-01 ENCOUNTER — Emergency Department: Payer: Medicare Other

## 2018-04-01 DIAGNOSIS — I10 Essential (primary) hypertension: Secondary | ICD-10-CM | POA: Diagnosis not present

## 2018-04-01 DIAGNOSIS — K209 Esophagitis, unspecified without bleeding: Secondary | ICD-10-CM

## 2018-04-01 DIAGNOSIS — R05 Cough: Secondary | ICD-10-CM | POA: Diagnosis not present

## 2018-04-01 DIAGNOSIS — R064 Hyperventilation: Secondary | ICD-10-CM | POA: Diagnosis not present

## 2018-04-01 DIAGNOSIS — R52 Pain, unspecified: Secondary | ICD-10-CM | POA: Diagnosis not present

## 2018-04-01 DIAGNOSIS — R000 Tachycardia, unspecified: Secondary | ICD-10-CM | POA: Diagnosis not present

## 2018-04-01 DIAGNOSIS — R1084 Generalized abdominal pain: Secondary | ICD-10-CM | POA: Diagnosis not present

## 2018-04-01 DIAGNOSIS — E119 Type 2 diabetes mellitus without complications: Secondary | ICD-10-CM | POA: Diagnosis not present

## 2018-04-01 DIAGNOSIS — R112 Nausea with vomiting, unspecified: Secondary | ICD-10-CM | POA: Diagnosis not present

## 2018-04-01 DIAGNOSIS — Z794 Long term (current) use of insulin: Secondary | ICD-10-CM | POA: Insufficient documentation

## 2018-04-01 DIAGNOSIS — R0602 Shortness of breath: Secondary | ICD-10-CM | POA: Diagnosis not present

## 2018-04-01 DIAGNOSIS — R109 Unspecified abdominal pain: Secondary | ICD-10-CM | POA: Diagnosis not present

## 2018-04-01 IMAGING — DX DG CHEST 1V PORT
1 series · 2 of 2 positions shown · non-contrast
Comparison: [DATE]

CLINICAL DATA: Cough and shortness of breath.

EXAM:
PORTABLE CHEST 1 VIEW

[Series 1: chest ap · 0.14mm/px · 2 of 2 slices shown]
[im 1/2]
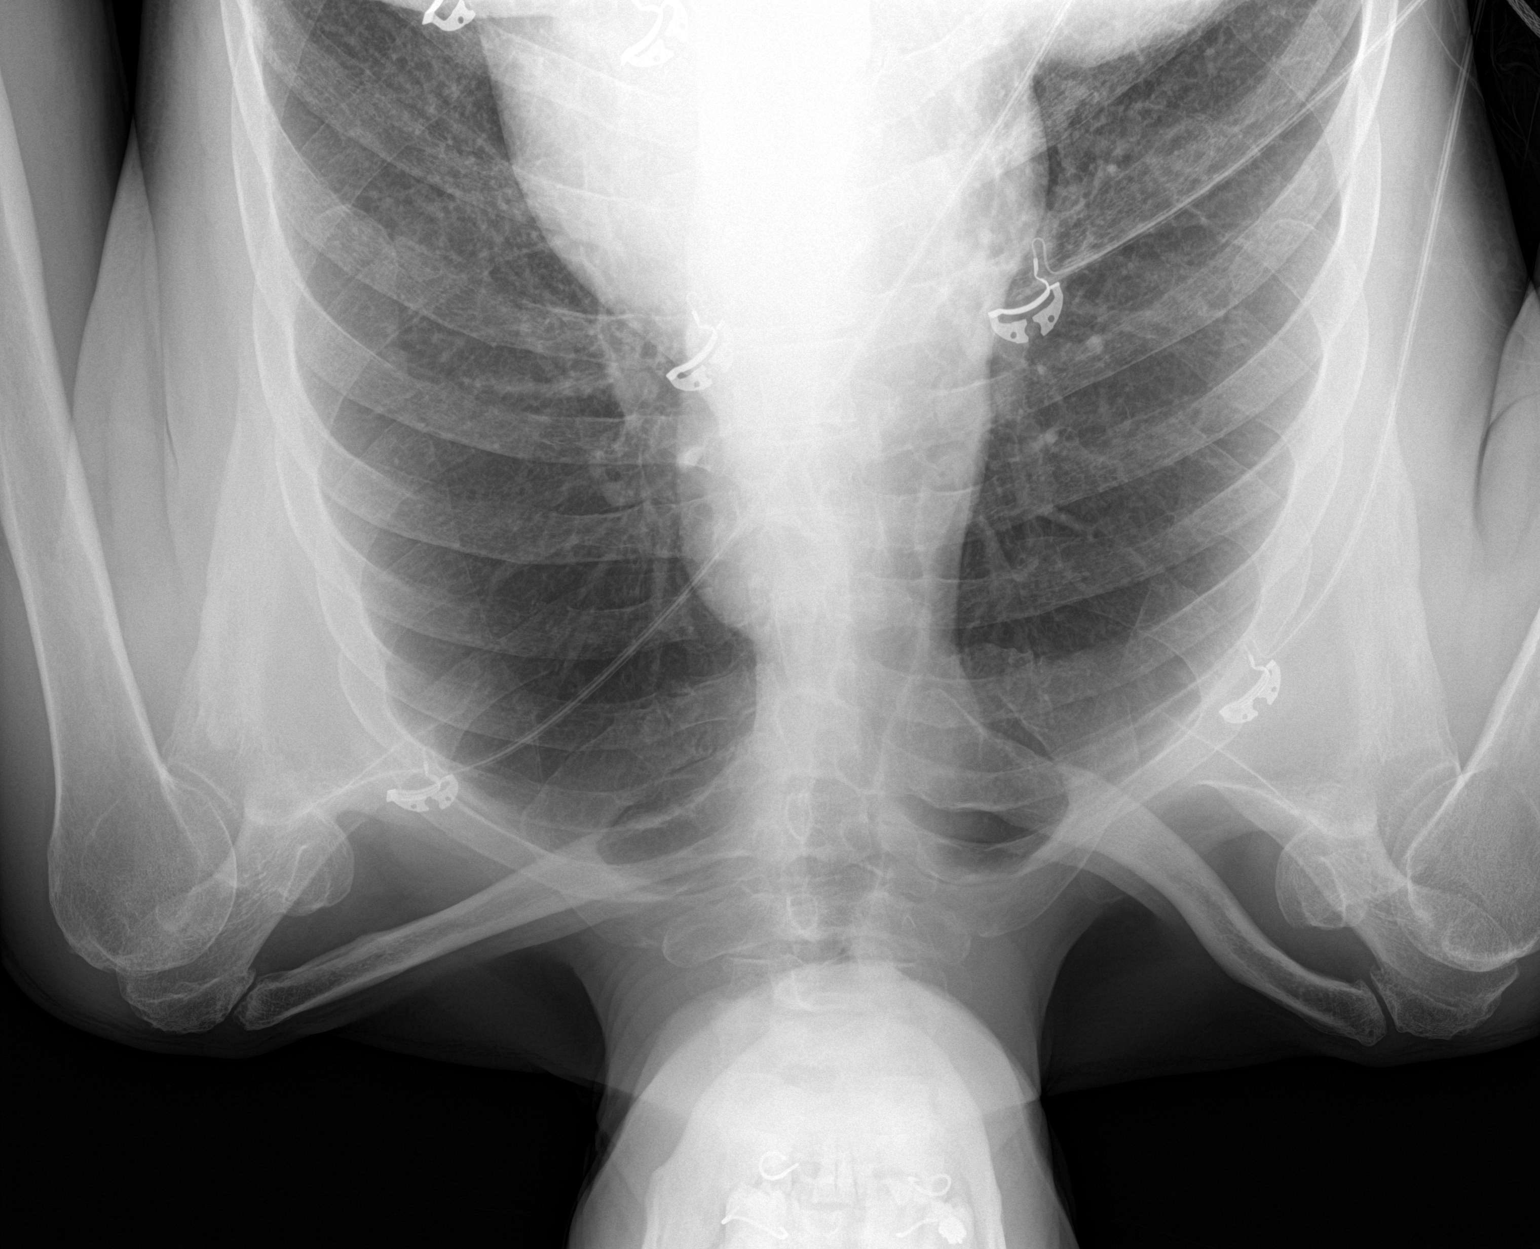
[im 2/2]
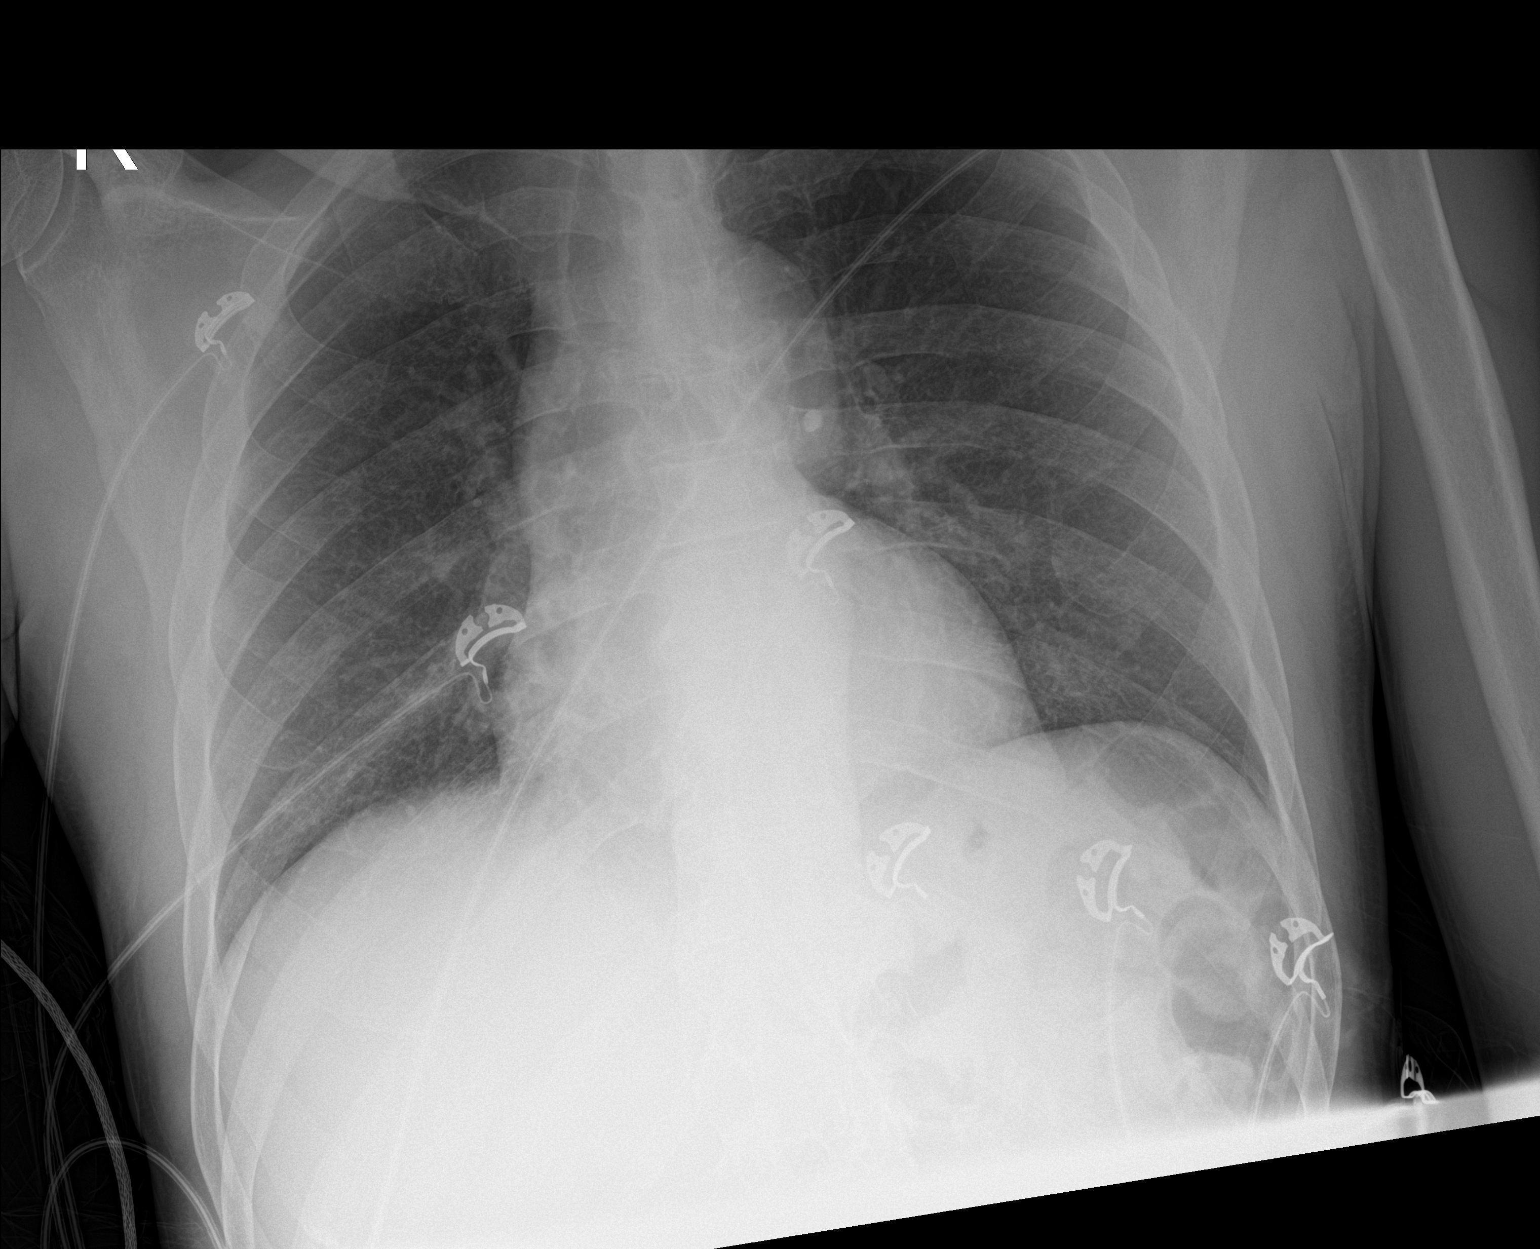

[2 of 2 positions shown; findings below may reference images not displayed]

FINDINGS: The cardiomediastinal contours are normal. The lungs are clear.
Pulmonary vasculature is normal. No consolidation, pleural effusion,
or pneumothorax. No acute osseous abnormalities are seen.
IMPRESSION: No acute chest findings.

## 2018-04-01 MED ORDER — ONDANSETRON HCL 4 MG/2ML IJ SOLN
4.0000 mg | Freq: Once | INTRAMUSCULAR | Status: AC
  Administered 2018-04-01: 4 mg via INTRAVENOUS
  Filled 2018-04-01: qty 2

## 2018-04-01 MED ORDER — SODIUM CHLORIDE 0.9 % IV BOLUS
1000.0000 mL | Freq: Once | INTRAVENOUS | Status: AC
  Administered 2018-04-01: 1000 mL via INTRAVENOUS

## 2018-04-01 NOTE — ED Triage Notes (Signed)
Pt arrives via EMS. Pt was pulled over by [employer], started c/o abdominal pain. Pt reporting that he has had abdominal pain all day and vomiting. Last insulin dose was this morning. Glucose en route to ED was 451

## 2018-04-01 NOTE — ED Provider Notes (Addendum)
[facility] Emergency Department Provider Note  ____________________________________________   First MD Initiated Contact with Patient 04/01/18 2342     (approximate)  I have reviewed the triage vital signs and the nursing notes.   HISTORY  Chief Complaint Abdominal Pain   HPI Nicholas Weiss is a 68 y.o. male who comes to the emergency department via EMS with abdominal pain nausea and vomiting.  The patient did not want to come to the hospital however he was pulled over by [facility] police for a traffic violation and once police showed up they noticed that he had abdominal discomfort and he requested an ambulance to come to the hospital.  He has a longstanding history of diabetes mellitus that is insulin-dependent and reports compliance with his insulin.  He has had DKA in the past.  His abdominal pain was gradual onset slowly progressive is now moderate severity and constant.  Nothing seems to make it better or worse.  He has no history of abdominal surgeries.      Past Medical History:  Diagnosis Date  . Diabetes mellitus without complication ([place])   . Hypercholesteremia   . Hypertension     Patient Active Problem List   Diagnosis Date Noted  . DKA (diabetic ketoacidoses) ([place]) 04/06/2016  . Hypercholesterolemia 01/24/2016  . Essential (primary) hypertension 06/07/2006  . DM neuropathy with neurologic complication ([place]) 29/93/7169    Past Surgical History:  Procedure Laterality Date  . KNEE SURGERY Right    Torn meniscus  . KNEE SURGERY Left     Prior to Admission medications   Medication Sig Start Date End Date Taking? Authorizing Provider  amitriptyline (ELAVIL) 10 MG tablet Start one tablet at bedtime x 4 days, then increase to two at bedtime x 4 days, then 3 at bedtime if needed for tingling in feet Patient taking differently: Take 30 mg by mouth at bedtime. Start one tablet at bedtime x 4 days, then increase to two at bedtime x 4 days,  then 3 at bedtime if needed for tingling in feet 01/03/18  Yes Fisher, Kirstie Peri, MD  aspirin 81 MG chewable tablet Chew 1 tablet by mouth daily.   Yes [provider]  gabapentin (NEURONTIN) 100 MG capsule Take one at night for 4 days, then increase to two at night for 4 days, then increase to 3a night if needed. Patient taking differently: Take 300 mg by mouth at bedtime. Take one at night for 4 days, then increase to two at night for 4 days, then increase to 3a night if needed. 07/27/17  Yes Birdie Sons, MD  hydrochlorothiazide (HYDRODIURIL) 50 MG tablet Take 1 tablet (50 mg total) by mouth daily. 01/20/17  Yes Birdie Sons, MD  insulin NPH Human (HUMULIN N,NOVOLIN N) 100 UNIT/ML injection 20 units twice a day 01/03/18  Yes Fisher, Kirstie Peri, MD  insulin regular (NOVOLIN R RELION) 100 units/mL injection Inject 0.04-0.12 mLs (4-12 Units total) into the skin 3 (three) times daily before meals. According to sliding scale 01/03/18  Yes Birdie Sons, MD  lovastatin (MEVACOR) 40 MG tablet Take 1 tablet (40 mg total) by mouth at bedtime. 09/27/17  Yes Birdie Sons, MD  diclofenac sodium (VOLTAREN) 1 % GEL Apply 2 g topically 4 (four) times daily. Patient not taking: Reported on 04/02/2018 08/27/17   Laban Emperor, PA-C  famotidine (PEPCID) 20 MG tablet Take 1 tablet (20 mg total) by mouth 2 (two) times daily. 04/02/18 04/02/19  Darel Hong,  MD  pantoprazole (PROTONIX) 40 MG tablet Take 1 tablet (40 mg total) by mouth daily. 04/02/18 04/02/19  Darel Hong, MD    Allergies Penicillins  Family History  Problem Relation Age of Onset  . Heart attack Father   . Hypertension Sister   . Cancer Sister     Social History Social History   Tobacco Use  . Smoking status: Never Smoker  . Smokeless tobacco: Never Used  Substance Use Topics  . Alcohol use: Yes    Alcohol/week: 1.0 standard drinks    Types: 1 Cans of beer per week    Comment: weekly  . Drug use: Yes    Types: Marijuana     Comment: Last used 01/08/15    Review of Systems Constitutional: No fever/chills Eyes: No visual changes. ENT: No sore throat. Cardiovascular: Denies chest pain. Respiratory: Denies shortness of breath. Gastrointestinal: Positive for abdominal pain.  Positive for nausea, positive for vomiting.  No diarrhea.  No constipation. Genitourinary: Negative for dysuria. Musculoskeletal: Negative for back pain. Skin: Negative for rash. Neurological: Negative for headaches, focal weakness or numbness.   ____________________________________________   PHYSICAL EXAM:  VITAL SIGNS: ED Triage Vitals  Enc Vitals Group     BP      Pulse      Resp      Temp      Temp src      SpO2      Weight      Height      Head Circumference      Peak Flow      Pain Score      Pain Loc      Pain Edu?      Excl. in [place]?     Constitutional: Alert and oriented x4 quite anxious appearing and hyperventilating.  Some ketones on his breath Eyes: PERRL EOMI. Head: Atraumatic. Nose: No congestion/rhinnorhea. Mouth/Throat: No trismus Neck: No stridor.   Cardiovascular: Tachycardic rate, regular rhythm. Grossly normal heart sounds.  Good peripheral circulation. Respiratory: Increased respiratory effort.  No retractions. Lungs CTAB and moving good air Gastrointestinal: Soft mild diffuse tenderness although with no focality.  No rebound or guarding no peritonitis Musculoskeletal: No lower extremity edema   Neurologic:  Normal speech and language. No gross focal neurologic deficits are appreciated. Skin:  Skin is warm, dry and intact. No rash noted. Psychiatric: Very anxious appearing and nervous    ____________________________________________   DIFFERENTIAL includes but not limited to  Mesenteric ischemia, bowel obstruction, DKA, HHS, pyelonephritis, dehydration ____________________________________________   LABS (all labs ordered are listed, but only abnormal results are displayed)  Labs  Reviewed  ACETAMINOPHEN LEVEL - Abnormal; Notable for the following components:      Result Value   Acetaminophen (Tylenol), Serum <10 (*)    All other components within normal limits  COMPREHENSIVE METABOLIC PANEL - Abnormal; Notable for the following components:   Sodium 134 (*)    CO2 20 (*)    Glucose, Bld 449 (*)    Creatinine, Ser 1.66 (*)    Total Protein 8.6 (*)    GFR calc non Af Amer 43 (*)    GFR calc Af Amer 49 (*)    Anion gap 16 (*)    All other components within normal limits  CBC WITH DIFFERENTIAL/PLATELET - Abnormal; Notable for the following components:   RBC 4.13 (*)    Hemoglobin 12.4 (*)    HCT 36.9 (*)    All other components within normal limits  BLOOD GAS, VENOUS - Abnormal; Notable for the following components:   pH, Ven 7.62 (*)    pCO2, Ven 22 (*)    pO2, Ven <31.0 (*)    Acid-Base Excess 3.1 (*)    All other components within normal limits  BETA-HYDROXYBUTYRIC ACID - Abnormal; Notable for the following components:   Beta-Hydroxybutyric Acid 3.19 (*)    All other components within normal limits  LACTIC ACID, PLASMA - Abnormal; Notable for the following components:   Lactic Acid, Venous 3.1 (*)    All other components within normal limits  TROPONIN I - Abnormal; Notable for the following components:   Troponin I 0.04 (*)    All other components within normal limits  POCT I-STAT, CHEM 8 - Abnormal; Notable for the following components:   Sodium 133 (*)    Creatinine, Ser 1.50 (*)    Glucose, Bld 443 (*)    Calcium, Ion 1.06 (*)    All other components within normal limits  ETHANOL  SALICYLATE LEVEL  LACTIC ACID, PLASMA    Lab work reviewed by me is very interesting.  His venous blood gas shows a pH of 7.62 and a very low CO2 at 22 which is consistent with acute respiratory alkalosis.  His first lactic acid is elevated although his second is not consistent with dehydration.  His blood sugar is high although no evidence of diabetic  ketoacidosis __________________________________________  EKG  ED ECG REPORT I, Darel Hong, the attending physician, personally viewed and interpreted this ECG.  Date: 04/05/2018 EKG Time:  Rate: 116 Rhythm: Sinus tachycardia QRS Axis: Leftward axis Intervals: First-degree AV block ST/T Wave abnormalities: normal Narrative Interpretation: no evidence of acute ischemia  ____________________________________________  RADIOLOGY  Chest x-ray reviewed by me with no acute disease CT of the abdomen pelvis reviewed by me with no acute intra-abdominal process but is suggestive of possible esophagitis ____________________________________________   PROCEDURES  Procedure(s) performed: no  .Critical Care Performed by: Darel Hong, MD Authorized by: Darel Hong, MD   Critical care provider statement:    Critical care time (minutes):  30   Critical care time was exclusive of:  Separately billable procedures and treating other patients   Critical care was necessary to treat or prevent imminent or life-threatening deterioration of the following conditions:  Dehydration   Critical care was time spent personally by me on the following activities:  Development of treatment plan with patient or surrogate, discussions with consultants, evaluation of patient's response to treatment, examination of patient, obtaining history from patient or surrogate, ordering and performing treatments and interventions, ordering and review of laboratory studies, ordering and review of radiographic studies, pulse oximetry, re-evaluation of patient's condition and review of old charts    Critical Care performed: yes  ____________________________________________   INITIAL IMPRESSION / [place] / ED COURSE  Pertinent labs & imaging results that were available during my care of the patient were reviewed by me and considered in my medical decision making (see chart for details).   As part  of my medical decision making, I reviewed the following data within the [place] History obtained from family if available, nursing notes, old chart and ekg, as well as notes from prior ED visits.  The patient comes to the emergency department with ketones on his breath along with elevated respiratory rate and abdominal pain and nausea.  My primary concern is actually diabetic ketoacidosis so we sent off a VBG in addition to his labs.  VBG is quite interesting showing acute respiratory alkalosis which could be secondary to a panic attack.  Given the severity of the patient's symptoms I am sending him to CT to evaluate for intra-abdominal pathology.     ----------------------------------------- 12:04 AM on 04/02/2018 -----------------------------------------  ISTAT cr 1.5 with no dka.  Will send straight to the scanner. ____________________________________________  The patient CT scan is negative for acute intra-abdominal pathology but is suggestive of severe esophagitis.  I initially treated the patient symptomatically with IV fentanyl and haloperidol for pain and nausea along with a liter of fluids.  When the CT came back suggesting esophagitis I treated him instead with continued Zofran, IV Pepcid, oral Cytotec, Maalox, and Carafate with near complete resolution of his symptoms.  His initial lactic acid was elevated although this cleared up nicely with fluids.  I think initially he was having anxiety related to being pulled over and his abdominal pain explaining the respiratory alkalosis.  I had a lengthy discussion with the patient regarding his findings and will start him on Protonix as well as Pepcid as an outpatient.  He was able to eat and drink and is discharged home in significantly improved condition.  He verbalizes understanding and agreement with the plan.  FINAL CLINICAL IMPRESSION(S) / ED DIAGNOSES  Final diagnoses:  Esophagitis  Hyperventilation      NEW  MEDICATIONS STARTED DURING THIS VISIT:  Discharge Medication List as of 04/02/2018  3:09 AM    START taking these medications   Details  pantoprazole (PROTONIX) 40 MG tablet Take 1 tablet (40 mg total) by mouth daily., Starting Sat 04/02/2018, Until Sun 04/02/2019, Print         Note:  This document was prepared using Dragon voice recognition software and may include unintentional dictation errors.    Darel Hong, MD 04/05/18 [id number]    Darel Hong, MD 04/14/18 [phone]

## 2018-04-02 ENCOUNTER — Emergency Department: Payer: Medicare Other

## 2018-04-02 ENCOUNTER — Encounter: Payer: Self-pay | Admitting: Radiology

## 2018-04-02 DIAGNOSIS — R05 Cough: Secondary | ICD-10-CM | POA: Diagnosis not present

## 2018-04-02 DIAGNOSIS — R109 Unspecified abdominal pain: Secondary | ICD-10-CM | POA: Diagnosis not present

## 2018-04-02 DIAGNOSIS — R0602 Shortness of breath: Secondary | ICD-10-CM | POA: Diagnosis not present

## 2018-04-02 LAB — COMPREHENSIVE METABOLIC PANEL
ALT: 16 U/L (ref 0–44)
ANION GAP: 16 — AB (ref 5–15)
AST: 20 U/L (ref 15–41)
Albumin: 4.2 g/dL (ref 3.5–5.0)
Alkaline Phosphatase: 66 U/L (ref 38–126)
BILIRUBIN TOTAL: 0.7 mg/dL (ref 0.3–1.2)
BUN: 23 mg/dL (ref 8–23)
CO2: 20 mmol/L — AB (ref 22–32)
Calcium: 9.2 mg/dL (ref 8.9–10.3)
Chloride: 98 mmol/L (ref 98–111)
Creatinine, Ser: 1.66 mg/dL — ABNORMAL HIGH (ref 0.61–1.24)
GFR calc Af Amer: 49 mL/min — ABNORMAL LOW (ref >60)
GFR calc non Af Amer: 43 mL/min — ABNORMAL LOW (ref >60)
Glucose, Bld: 449 mg/dL — ABNORMAL HIGH (ref 70–99)
Potassium: 5 mmol/L (ref 3.5–5.1)
Sodium: 134 mmol/L — ABNORMAL LOW (ref 135–145)
Total Protein: 8.6 g/dL — ABNORMAL HIGH (ref 6.5–8.1)

## 2018-04-02 LAB — POCT I-STAT, CHEM 8
BUN: 21 mg/dL (ref 8–23)
Calcium, Ion: 1.06 mmol/L — ABNORMAL LOW (ref 1.15–1.40)
Chloride: 100 mmol/L (ref 98–111)
Creatinine, Ser: 1.5 mg/dL — ABNORMAL HIGH (ref 0.61–1.24)
Glucose, Bld: 443 mg/dL — ABNORMAL HIGH (ref 70–99)
HCT: 41 % (ref 39.0–52.0)
Hemoglobin: 13.9 g/dL (ref 13.0–17.0)
Potassium: 5 mmol/L (ref 3.5–5.1)
Sodium: 133 mmol/L — ABNORMAL LOW (ref 135–145)
TCO2: 23 mmol/L (ref 22–32)

## 2018-04-02 LAB — BLOOD GAS, VENOUS
Acid-Base Excess: 3.1 mmol/L — ABNORMAL HIGH (ref 0.0–2.0)
Bicarbonate: 22.6 mmol/L (ref 20.0–28.0)
O2 Saturation: 62.8 %
Patient temperature: 37
pCO2, Ven: 22 mmHg — ABNORMAL LOW (ref 44.0–60.0)
pH, Ven: 7.62 (ref 7.250–7.430)
pO2, Ven: <31 mmHg — CL (ref 32.0–45.0)

## 2018-04-02 LAB — BETA-HYDROXYBUTYRIC ACID: Beta-Hydroxybutyric Acid: 3.19 mmol/L — ABNORMAL HIGH (ref 0.05–0.27)

## 2018-04-02 LAB — CBC WITH DIFFERENTIAL/PLATELET
Abs Immature Granulocytes: 0.02 10*3/uL (ref 0.00–0.07)
BASOS PCT: 0 %
Basophils Absolute: 0 10*3/uL (ref 0.0–0.1)
Eosinophils Absolute: 0 10*3/uL (ref 0.0–0.5)
Eosinophils Relative: 0 %
HCT: 36.9 % — ABNORMAL LOW (ref 39.0–52.0)
Hemoglobin: 12.4 g/dL — ABNORMAL LOW (ref 13.0–17.0)
Immature Granulocytes: 0 %
LYMPHS PCT: 15 %
Lymphs Abs: 1.2 10*3/uL (ref 0.7–4.0)
MCH: 30 pg (ref 26.0–34.0)
MCHC: 33.6 g/dL (ref 30.0–36.0)
MCV: 89.3 fL (ref 80.0–100.0)
MONOS PCT: 7 %
Monocytes Absolute: 0.5 10*3/uL (ref 0.1–1.0)
Neutro Abs: 6.5 10*3/uL (ref 1.7–7.7)
Neutrophils Relative %: 78 %
Platelets: 295 10*3/uL (ref 150–400)
RBC: 4.13 MIL/uL — ABNORMAL LOW (ref 4.22–5.81)
RDW: 12.5 % (ref 11.5–15.5)
WBC: 8.3 10*3/uL (ref 4.0–10.5)
nRBC: 0 % (ref 0.0–0.2)

## 2018-04-02 LAB — SALICYLATE LEVEL: Salicylate Lvl: <7 mg/dL (ref 2.8–30.0)

## 2018-04-02 LAB — ACETAMINOPHEN LEVEL: Acetaminophen (Tylenol), Serum: <10 ug/mL — ABNORMAL LOW (ref 10–30)

## 2018-04-02 LAB — ETHANOL: Alcohol, Ethyl (B): <10 mg/dL (ref <10)

## 2018-04-02 LAB — LACTIC ACID, PLASMA
Lactic Acid, Venous: 1.9 mmol/L (ref 0.5–1.9)
Lactic Acid, Venous: 3.1 mmol/L (ref 0.5–1.9)

## 2018-04-02 LAB — TROPONIN I: Troponin I: 0.04 ng/mL (ref <0.03)

## 2018-04-02 IMAGING — CT CT ABD-PELV W/ CM
2 of 5 series · 16 of 46 positions shown, 18 images · IV contrast (APPLIED)
Comparison: CT [DATE].

CLINICAL DATA: Abdominal pain. Abdominal infection. Vomiting.

EXAM:
CT ABDOMEN AND PELVIS WITH CONTRAST
TECHNIQUE: Multidetector CT imaging of the abdomen and pelvis was performed
using the standard protocol following bolus administration of
intravenous contrast.
CONTRAST:  100mL [YM] IOPAMIDOL ([YM]) INJECTION 61%

[Series 2: axial st · axial · 0.74mm/px · z∈[-750,-335]mm · 13 of 93 slices shown, 15 images]
[im 5/93  soft-tissue]
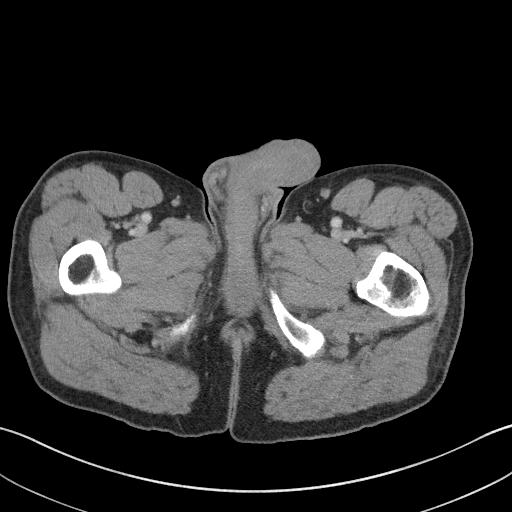
[im 5/93  bone]
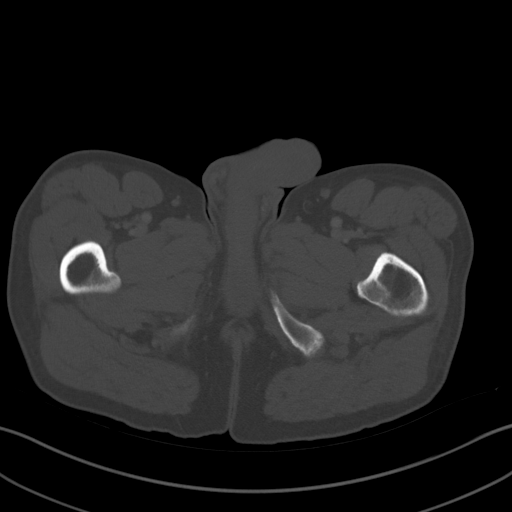
[im 14/93  soft-tissue]
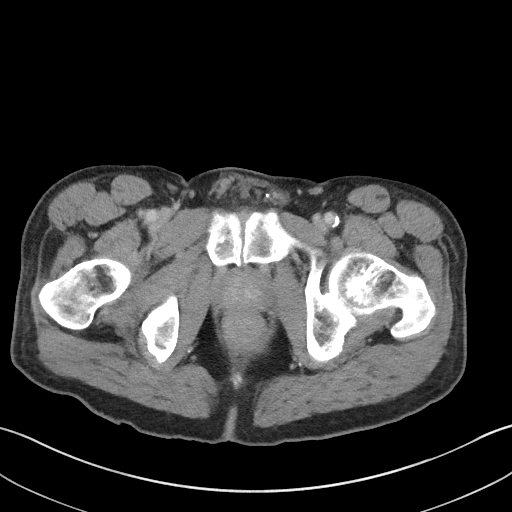
[im 18/93  soft-tissue]
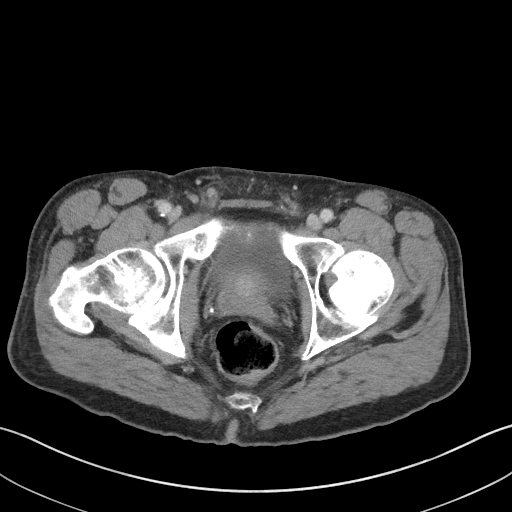
[im 27/93  soft-tissue]
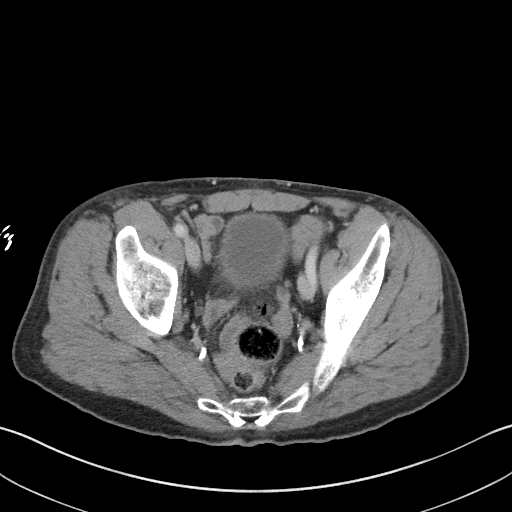
[im 31/93  soft-tissue]
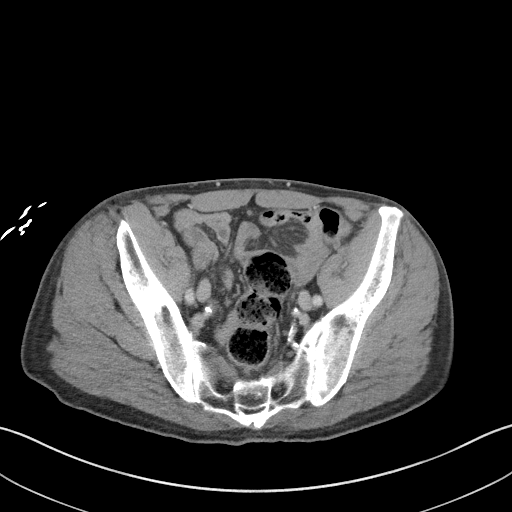
[im 40/93  soft-tissue]
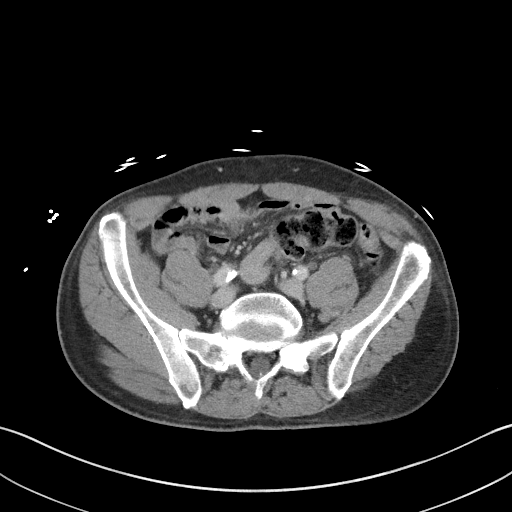
[im 49/93  soft-tissue]
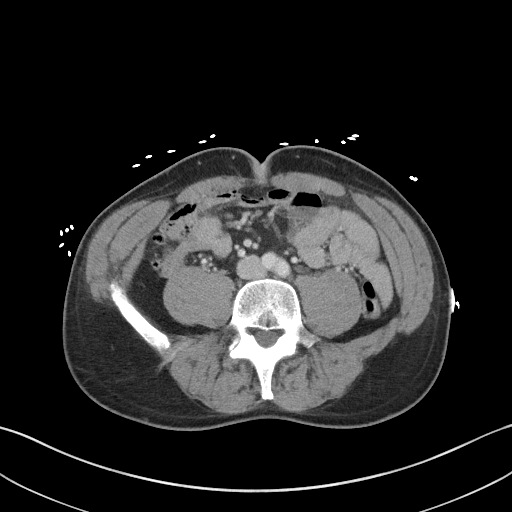
[im 53/93  soft-tissue]
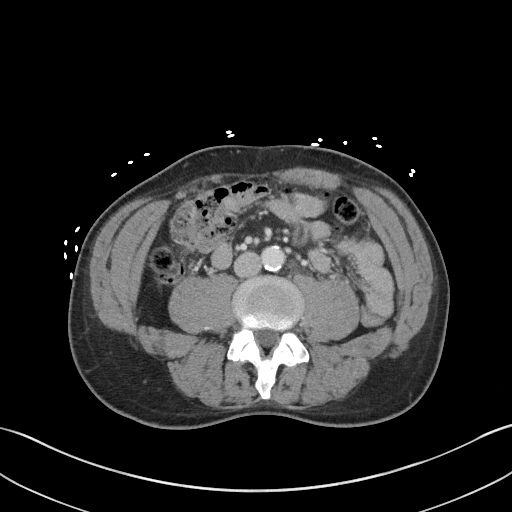
[im 62/93  soft-tissue]
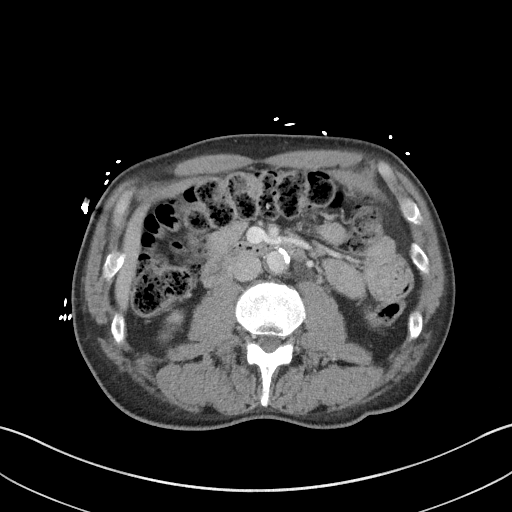
[im 62/93  bone]
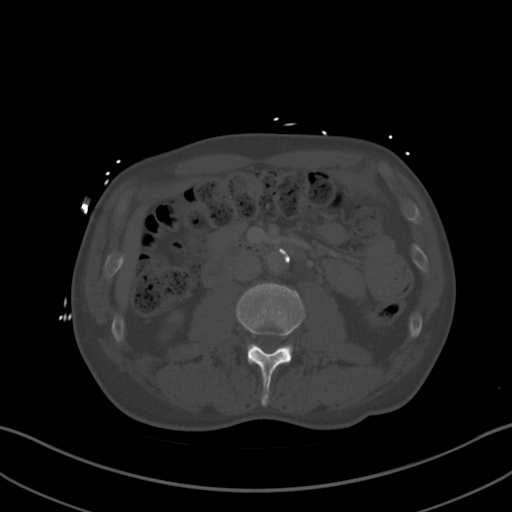
[im 66/93  soft-tissue]
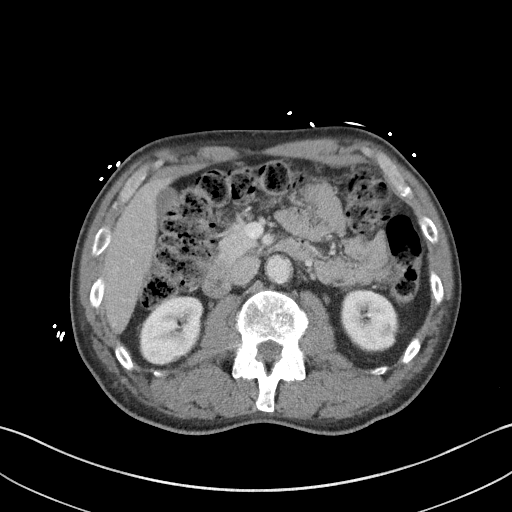
[im 75/93  soft-tissue]
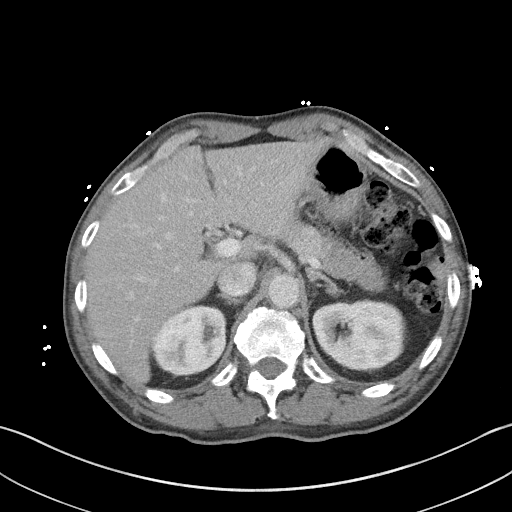
[im 79/93  soft-tissue]
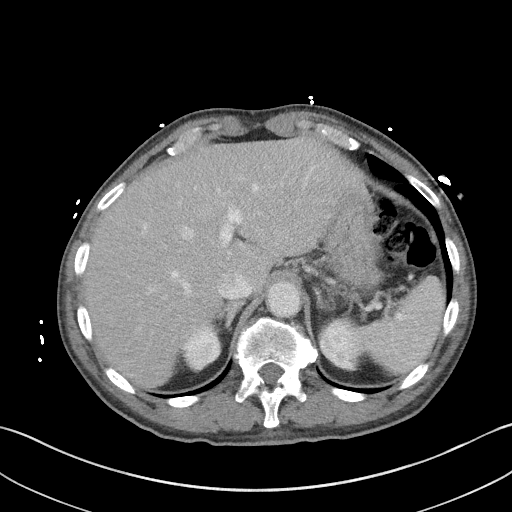
[im 88/93  soft-tissue]
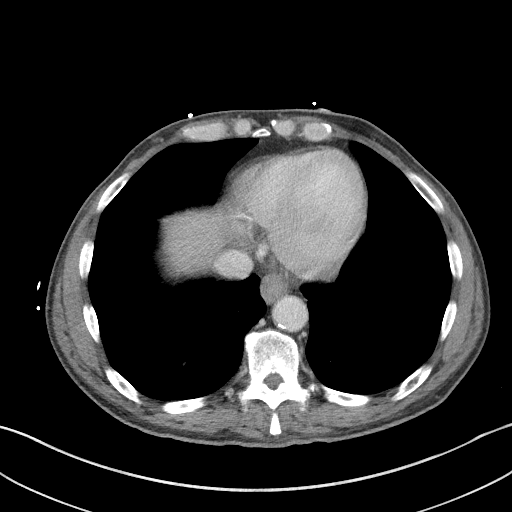

[Series 5: coronal st · coronal · 0.72mm/px · 3 of 79 slices shown]
[im 27/79  soft-tissue]
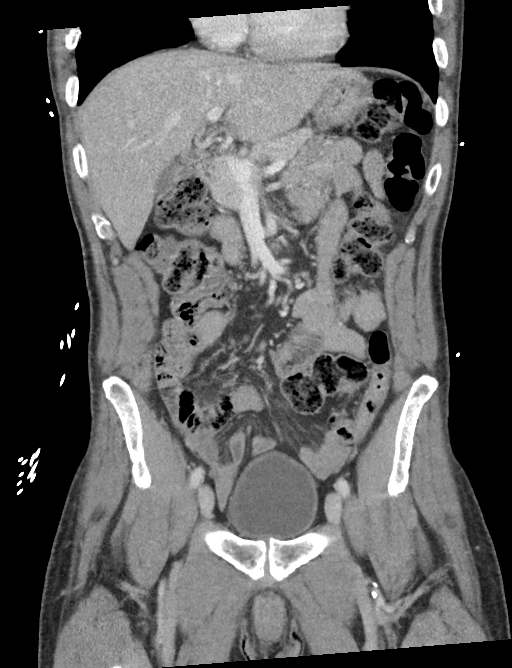
[im 35/79  soft-tissue]
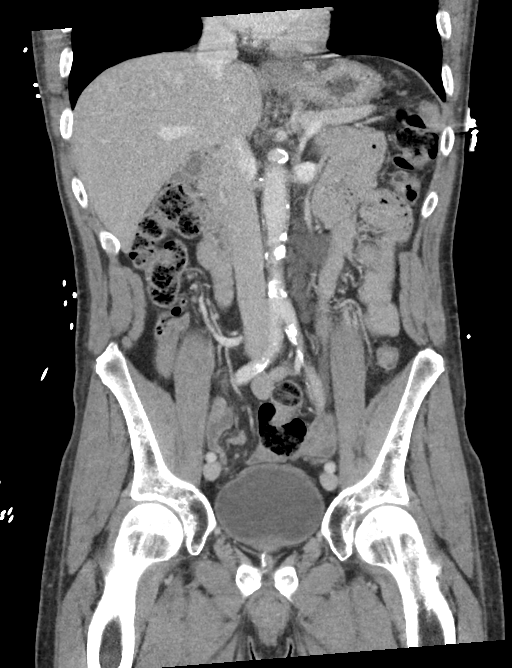
[im 44/79  soft-tissue]
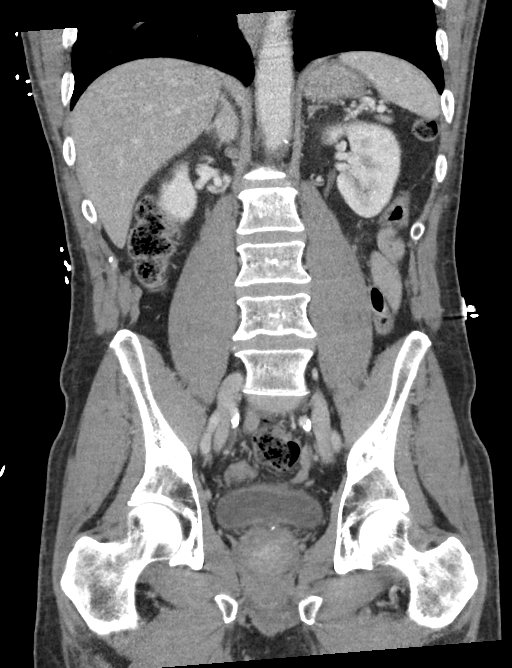

[16 of 46 positions shown; findings below may reference images not displayed]

FINDINGS: Lower chest: No pleural fluid or consolidation. Tiny right middle
lobe pulmonary nodule stable from [YM] and considered benign.

Hepatobiliary: Stable subcapsular hypodensity in the right lobe of
the liver consistent with hemangioma. No new focal hepatic
abnormality. Gallbladder physiologically distended, no calcified
stone. No biliary dilatation.

Pancreas: No ductal dilatation or inflammation.

Spleen: Normal in size without focal abnormality.

Adrenals/Urinary Tract: No adrenal nodule. No hydronephrosis or
perinephric edema. Homogeneous renal enhancement with symmetric
excretion on delayed phase imaging. Minimal cortical scarring in
both kidneys. Urinary bladder is physiologically distended without
wall thickening.

Stomach/Bowel: Mild distal esophageal wall thickening. Stomach is
under distended limiting assessment. No small bowel wall thickening,
inflammatory change, or obstruction. Normal appendix. Moderate
volume of stool throughout the colon. No colonic wall thickening or
inflammatory change. No significant diverticular disease.

Vascular/Lymphatic: Aorto bi-iliac atherosclerosis. No aneurysm. No
enlarged lymph nodes in the abdomen or pelvis.

Reproductive: Prominent prostate gland. Seminal vesicle
calcifications.

Other: Lobulated cystic structure in the left periaortic region is
unchanged from [YM], likely lymphatic malformation. No free air or
free fluid. No abdominal wall hernia.

Musculoskeletal: There are no acute or suspicious osseous
abnormalities.
IMPRESSION: 1. No acute abnormality or explanation for abdominal pain.
2. Mild distal esophageal wall thickening may be reflux or
esophagitis.
3. Stable cystic structure in the left retroperitoneum since [YM]
CT, likely lymphatic malformation and of no clinical significance.
4.  Aortic Atherosclerosis ([YM]-[YM]).

## 2018-04-02 MED ORDER — SODIUM CHLORIDE 0.9 % IV SOLN
40.0000 mg | Freq: Once | INTRAVENOUS | Status: AC
  Administered 2018-04-02: 40 mg via INTRAVENOUS
  Filled 2018-04-02: qty 4

## 2018-04-02 MED ORDER — MISOPROSTOL 200 MCG PO TABS
200.0000 ug | ORAL_TABLET | Freq: Once | ORAL | Status: AC
  Administered 2018-04-02: 200 ug via ORAL
  Filled 2018-04-02: qty 1

## 2018-04-02 MED ORDER — FAMOTIDINE 20 MG PO TABS: 20.0000 mg | ORAL_TABLET | Freq: Two times a day (BID) | ORAL | 0 refills | Status: DC

## 2018-04-02 MED ORDER — FENTANYL CITRATE (PF) 100 MCG/2ML IJ SOLN: 50.0000 ug | Freq: Once | INTRAMUSCULAR | Status: DC

## 2018-04-02 MED ORDER — HALOPERIDOL LACTATE 5 MG/ML IJ SOLN
2.5000 mg | Freq: Once | INTRAMUSCULAR | Status: AC
  Administered 2018-04-02: 2.5 mg via INTRAVENOUS
  Filled 2018-04-02: qty 1

## 2018-04-02 MED ORDER — PANTOPRAZOLE SODIUM 40 MG PO TBEC: 40.0000 mg | DELAYED_RELEASE_TABLET | Freq: Every day | ORAL | 0 refills | Status: DC

## 2018-04-02 MED ORDER — SODIUM CHLORIDE 0.9 % IV BOLUS
1000.0000 mL | Freq: Once | INTRAVENOUS | Status: AC
  Administered 2018-04-02: 1000 mL via INTRAVENOUS

## 2018-04-02 MED ORDER — SUCRALFATE 1 G PO TABS
1.0000 g | ORAL_TABLET | Freq: Once | ORAL | Status: AC
  Administered 2018-04-02: 1 g via ORAL
  Filled 2018-04-02: qty 1

## 2018-04-02 MED ORDER — ALUM & MAG HYDROXIDE-SIMETH 200-200-20 MG/5ML PO SUSP
30.0000 mL | Freq: Once | ORAL | Status: AC
  Administered 2018-04-02: 30 mL via ORAL
  Filled 2018-04-02: qty 30

## 2018-04-02 MED ORDER — FENTANYL CITRATE (PF) 100 MCG/2ML IJ SOLN
75.0000 ug | Freq: Once | INTRAMUSCULAR | Status: AC
  Administered 2018-04-02: 75 ug via INTRAVENOUS
  Filled 2018-04-02: qty 2

## 2018-04-02 MED ORDER — IOPAMIDOL (ISOVUE-300) INJECTION 61%
100.0000 mL | Freq: Once | INTRAVENOUS | Status: AC | PRN
  Administered 2018-04-02: 100 mL via INTRAVENOUS

## 2018-04-02 NOTE — Discharge Instructions (Signed)
Fortunately today your lab work and your CT scan were reassuring.  Please resume taking Pepcid twice a day and begin taking Protonix every day to help calm down the acid in your stomach and follow-up with your primary care physician this coming Monday for a recheck.  Return to the emergency department sooner for any concerns.  It was a pleasure to take care of you today, and thank you for coming to our emergency department.  If you have any questions or concerns before leaving please ask the nurse to grab me and I'm more than happy to go through your aftercare instructions again.  If you were prescribed any opioid pain medication today such as Norco, Vicodin, Percocet, morphine, hydrocodone, or oxycodone please make sure you do not drive when you are taking this medication as it can alter your ability to drive safely.  If you have any concerns once you are home that you are not improving or are in fact getting worse before you can make it to your follow-up appointment, please do not hesitate to call [phone] and come back for further evaluation.  Darel Hong, MD  Results for orders placed or performed during the hospital encounter of 04/01/18  Acetaminophen level  Result Value Ref Range   Acetaminophen (Tylenol), Serum <10 (L) 10 - 30 ug/mL  Ethanol  Result Value Ref Range   Alcohol, Ethyl (B) <16 <10 mg/dL  Salicylate level  Result Value Ref Range   Salicylate Lvl <9.6 2.8 - 30.0 mg/dL  Comprehensive metabolic panel  Result Value Ref Range   Sodium 134 (L) 135 - 145 mmol/L   Potassium 5.0 3.5 - 5.1 mmol/L   Chloride 98 98 - 111 mmol/L   CO2 20 (L) 22 - 32 mmol/L   Glucose, Bld 449 (H) 70 - 99 mg/dL   BUN 23 8 - 23 mg/dL   Creatinine, Ser 1.66 (H) 0.61 - 1.24 mg/dL   Calcium 9.2 8.9 - 10.3 mg/dL   Total Protein 8.6 (H) 6.5 - 8.1 g/dL   Albumin 4.2 3.5 - 5.0 g/dL   AST 20 15 - 41 U/L   ALT 16 0 - 44 U/L   Alkaline Phosphatase 66 38 - 126 U/L   Total Bilirubin 0.7 0.3 - 1.2 mg/dL    GFR calc non Af Amer 43 (L) >60 mL/min   GFR calc Af Amer 49 (L) >60 mL/min   Anion gap 16 (H) 5 - 15  CBC with Differential  Result Value Ref Range   WBC 8.3 4.0 - 10.5 K/uL   RBC 4.13 (L) 4.22 - 5.81 MIL/uL   Hemoglobin 12.4 (L) 13.0 - 17.0 g/dL   HCT 36.9 (L) 39.0 - 52.0 %   MCV 89.3 80.0 - 100.0 fL   MCH 30.0 26.0 - 34.0 pg   MCHC 33.6 30.0 - 36.0 g/dL   RDW 12.5 11.5 - 15.5 %   Platelets 295 150 - 400 K/uL   nRBC 0.0 0.0 - 0.2 %   Neutrophils Relative % 78 %   Neutro Abs 6.5 1.7 - 7.7 K/uL   Lymphocytes Relative 15 %   Lymphs Abs 1.2 0.7 - 4.0 K/uL   Monocytes Relative 7 %   Monocytes Absolute 0.5 0.1 - 1.0 K/uL   Eosinophils Relative 0 %   Eosinophils Absolute 0.0 0.0 - 0.5 K/uL   Basophils Relative 0 %   Basophils Absolute 0.0 0.0 - 0.1 K/uL   Immature Granulocytes 0 %   Abs Immature Granulocytes 0.02  0.00 - 0.07 K/uL  Blood gas, venous  Result Value Ref Range   pH, Ven 7.62 (HH) 7.250 - 7.430   pCO2, Ven 22 (L) 44.0 - 60.0 mmHg   pO2, Ven <31.0 (LL) 32.0 - 45.0 mmHg   Bicarbonate 22.6 20.0 - 28.0 mmol/L   Acid-Base Excess 3.1 (H) 0.0 - 2.0 mmol/L   O2 Saturation 62.8 %   Patient temperature 37.0    Collection site LINE    Sample type VENOUS   Beta-hydroxybutyric acid  Result Value Ref Range   Beta-Hydroxybutyric Acid 3.19 (H) 0.05 - 0.27 mmol/L  Lactic acid, plasma  Result Value Ref Range   Lactic Acid, Venous 3.1 (HH) 0.5 - 1.9 mmol/L  Lactic acid, plasma  Result Value Ref Range   Lactic Acid, Venous 1.9 0.5 - 1.9 mmol/L  Troponin I - Once  Result Value Ref Range   Troponin I 0.04 (HH) <0.03 ng/mL  I-STAT, chem 8  Result Value Ref Range   Sodium 133 (L) 135 - 145 mmol/L   Potassium 5.0 3.5 - 5.1 mmol/L   Chloride 100 98 - 111 mmol/L   BUN 21 8 - 23 mg/dL   Creatinine, Ser 1.50 (H) 0.61 - 1.24 mg/dL   Glucose, Bld 443 (H) 70 - 99 mg/dL   Calcium, Ion 1.06 (L) 1.15 - 1.40 mmol/L   TCO2 23 22 - 32 mmol/L   Hemoglobin 13.9 13.0 - 17.0 g/dL   HCT 41.0  39.0 - 52.0 %   Ct Abdomen Pelvis W Contrast  Result Date: 04/02/2018 CLINICAL DATA:  Abdominal pain. Abdominal infection. Vomiting. EXAM: CT ABDOMEN AND PELVIS WITH CONTRAST TECHNIQUE: Multidetector CT imaging of the abdomen and pelvis was performed using the standard protocol following bolus administration of intravenous contrast. CONTRAST:  150mL ISOVUE-300 IOPAMIDOL (ISOVUE-300) INJECTION 61% COMPARISON:  CT 10/17/2014. FINDINGS: Lower chest: No pleural fluid or consolidation. Tiny right middle lobe pulmonary nodule stable from 2016 and considered benign. Hepatobiliary: Stable subcapsular hypodensity in the right lobe of the liver consistent with hemangioma. No new focal hepatic abnormality. Gallbladder physiologically distended, no calcified stone. No biliary dilatation. Pancreas: No ductal dilatation or inflammation. Spleen: Normal in size without focal abnormality. Adrenals/Urinary Tract: No adrenal nodule. No hydronephrosis or perinephric edema. Homogeneous renal enhancement with symmetric excretion on delayed phase imaging. Minimal cortical scarring in both kidneys. Urinary bladder is physiologically distended without wall thickening. Stomach/Bowel: Mild distal esophageal wall thickening. Stomach is under distended limiting assessment. No small bowel wall thickening, inflammatory change, or obstruction. Normal appendix. Moderate volume of stool throughout the colon. No colonic wall thickening or inflammatory change. No significant diverticular disease. Vascular/Lymphatic: Aorto bi-iliac atherosclerosis. No aneurysm. No enlarged lymph nodes in the abdomen or pelvis. Reproductive: Prominent prostate gland. Seminal vesicle calcifications. Other: Lobulated cystic structure in the left periaortic region is unchanged from 2016, likely lymphatic malformation. No free air or free fluid. No abdominal wall hernia. Musculoskeletal: There are no acute or suspicious osseous abnormalities. IMPRESSION: 1. No acute  abnormality or explanation for abdominal pain. 2. Mild distal esophageal wall thickening may be reflux or esophagitis. 3. 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 4.  Aortic Atherosclerosis (ICD10-I70.0). Electronically Signed   By: Keith Rake M.D.   On: 04/02/2018 00:48   Dg Chest Port 1 View  Result Date: 04/02/2018 CLINICAL DATA:  Cough and shortness of breath. EXAM: PORTABLE CHEST 1 VIEW COMPARISON:  07/20/2017 FINDINGS: The cardiomediastinal contours are  normal. The lungs are clear. Pulmonary vasculature is normal. No consolidation, pleural effusion, or pneumothorax. No acute osseous abnormalities are seen. IMPRESSION: No acute chest findings. Electronically Signed   By: Keith Rake M.D.   On: 04/02/2018 00:10

## 2018-04-02 NOTE — ED Notes (Signed)
MD notified I-stat results ready.

## 2018-04-02 NOTE — ED Notes (Signed)
ED Provider at bedside. 

## 2018-04-12 ENCOUNTER — Ambulatory Visit: Payer: Self-pay | Admitting: Family Medicine

## 2018-04-19 ENCOUNTER — Ambulatory Visit: Payer: Self-pay | Admitting: Family Medicine

## 2018-04-26 ENCOUNTER — Ambulatory Visit (INDEPENDENT_AMBULATORY_CARE_PROVIDER_SITE_OTHER): Payer: Medicare Other | Admitting: Family Medicine

## 2018-04-26 ENCOUNTER — Encounter: Payer: Self-pay | Admitting: Family Medicine

## 2018-04-26 VITALS — BP 148/88 | HR 88 | Temp 98.0°F | Resp 16 | Ht 70.0 in | Wt 175.0 lb

## 2018-04-26 DIAGNOSIS — E1165 Type 2 diabetes mellitus with hyperglycemia: Secondary | ICD-10-CM

## 2018-04-26 DIAGNOSIS — I10 Essential (primary) hypertension: Secondary | ICD-10-CM

## 2018-04-26 DIAGNOSIS — E7800 Pure hypercholesterolemia, unspecified: Secondary | ICD-10-CM

## 2018-04-26 DIAGNOSIS — E1149 Type 2 diabetes mellitus with other diabetic neurological complication: Secondary | ICD-10-CM | POA: Diagnosis not present

## 2018-04-26 LAB — POCT GLYCOSYLATED HEMOGLOBIN (HGB A1C): Hemoglobin A1C: 9.9 % — AB (ref 4.0–5.6)

## 2018-04-26 LAB — POCT UA - MICROALBUMIN: Microalbumin Ur, POC: 20 mg/L

## 2018-04-26 MED ORDER — LOVASTATIN 40 MG PO TABS: 40.0000 mg | ORAL_TABLET | Freq: Every day | ORAL | 4 refills | Status: DC

## 2018-04-26 NOTE — Progress Notes (Signed)
Patient: Nicholas Weiss Male    DOB: [birth year]   68 y.o.   MRN: [id number] Visit Date: 04/26/2018  Today's Provider: Lelon Huh, MD   Chief Complaint  Patient presents with  . Follow-up    ER Follow up 04/01/2018  . Hyperlipidemia  . Hypertension  . Diabetes   Subjective:     HPI    Follow up ER visit  Patient was seen in ER for Abdominal pain on 04/01/2018. He was treated for Esophagitis and Hyperventilation. Treatment for this included starting Pantoprazole 40mg . He reports inadequate compliance with treatment.  Pt reports he is no longer taking Pantoprazole.  He reports this condition is Improved.  ------------------------------------------------------------------------------------      Diabetes Mellitus Type II, Follow-up:   Lab Results  Component Value Date   HGBA1C 11.2 (A) 01/03/2018   HGBA1C 11.2 (A) 09/27/2017   HGBA1C 11.1 07/27/2017   Last seen for diabetes 3 months ago.  Management since then includes increast NPH to 20 Units twice a day, advised not to take NPH and Lantus together.  Continued SS Humlin R during the day. He reports excellent compliance with treatment. He is not having side effects.  Current symptoms include nausea and visual disturbances and have been stable. Home blood sugar records: Pt reports his blood sugar this morning was 61 but can run between 200-300 at times.   Episodes of hypoglycemia? yes - Occasionally   Current Insulin Regimen: NPH 20 units twice a day and sliding scale Humulin R, but had only been using once a day until last few days.  Most Recent Eye Exam: Over a year; pt needs referral Weight trend: stable  Current diet: in general, a "healthy" diet   Current exercise: occasionally  ------------------------------------------------------------------------   Hypertension, follow-up:  BP Readings from Last 3 Encounters:  04/26/18 (!) 148/88  04/02/18 (!) 182/94  01/03/18 124/74    He was last  seen for hypertension 9 months ago.  BP at that visit was 152/98. Management since that visit includes advising pt to take HCTZ more constantly.  Will consider adding another BP medication if not controlled.  He reports excellent compliance with treatment. He is not having side effects.  He is exercising some. He is adherent to low salt diet.   Outside blood pressures are fluctuating a lot. He is experiencing none.  Patient denies chest pain, chest pressure/discomfort, exertional chest pressure/discomfort, lower extremity edema and syncope.   Cardiovascular risk factors include advanced age (older than 24 for men, 21 for women), diabetes mellitus, dyslipidemia, hypertension and male gender.  Use of agents associated with hypertension: none.   ------------------------------------------------------------------------    Lipid/Cholesterol, Follow-up:   Management since that visit includes No changes.  He reports excellent compliance with treatment. He is not having side effects.   Wt Readings from Last 3 Encounters:  04/26/18 175 lb (79.4 kg)  04/01/18 160 lb (72.6 kg)  01/03/18 169 lb (76.7 kg)    ------------------------------------------------------------------------    Allergies  Allergen Reactions  . Penicillins Other (See Comments)    Syncope  Has patient had a PCN reaction causing immediate rash, facial/tongue/throat swelling, SOB or lightheadedness with hypotension: No Has patient had a PCN reaction causing severe rash involving mucus membranes or skin necrosis: No Has patient had a PCN reaction that required hospitalization No Has patient had a PCN reaction occurring within the last 10 years: No If all of the above answers are "NO", then may proceed with Cephalosporin  use.      Current Outpatient Medications:  .  amitriptyline (ELAVIL) 10 MG tablet, Start one tablet at bedtime x 4 days, then increase to two at bedtime x 4 days, then 3 at bedtime if needed for  tingling in feet (Patient taking differently: Take 30 mg by mouth at bedtime. Start one tablet at bedtime x 4 days, then increase to two at bedtime x 4 days, then 3 at bedtime if needed for tingling in feet), Disp: 60 tablet, Rfl: 4 .  aspirin 81 MG chewable tablet, Chew 1 tablet by mouth daily., Disp: , Rfl:  .  famotidine (PEPCID) 20 MG tablet, Take 1 tablet (20 mg total) by mouth 2 (two) times daily., Disp: 60 tablet, Rfl: 0 .  gabapentin (NEURONTIN) 100 MG capsule, Take one at night for 4 days, then increase to two at night for 4 days, then increase to 3a night if needed. (Patient taking differently: Take 300 mg by mouth at bedtime. Take one at night for 4 days, then increase to two at night for 4 days, then increase to 3a night if needed.), Disp: 90 capsule, Rfl: 5 .  hydrochlorothiazide (HYDRODIURIL) 50 MG tablet, Take 1 tablet (50 mg total) by mouth daily., Disp: 90 tablet, Rfl: 1 .  insulin NPH Human (HUMULIN N,NOVOLIN N) 100 UNIT/ML injection, 20 units twice a day, Disp: 10 mL, Rfl: 5 .  insulin regular (NOVOLIN R RELION) 100 units/mL injection, Inject 0.04-0.12 mLs (4-12 Units total) into the skin 3 (three) times daily before meals. According to sliding scale, Disp: 10 mL, Rfl: 11 .  lovastatin (MEVACOR) 40 MG tablet, Take 1 tablet (40 mg total) by mouth at bedtime., Disp: 90 tablet, Rfl: 4 .  diclofenac sodium (VOLTAREN) 1 % GEL, Apply 2 g topically 4 (four) times daily. (Patient not taking: Reported on 04/02/2018), Disp: 100 g, Rfl: 0 .  pantoprazole (PROTONIX) 40 MG tablet, Take 1 tablet (40 mg total) by mouth daily. (Patient not taking: Reported on 04/26/2018), Disp: 30 tablet, Rfl: 0  Review of Systems  Constitutional: Negative.   Respiratory: Negative.   Cardiovascular: Negative.   Gastrointestinal: Positive for abdominal pain and nausea. Negative for abdominal distention, anal bleeding, blood in stool, constipation, diarrhea, rectal pain and vomiting.  Endocrine: Negative.     Neurological: Positive for numbness. Negative for dizziness, light-headedness and headaches.    Social History   Tobacco Use  . Smoking status: Never Smoker  . Smokeless tobacco: Never Used  Substance Use Topics  . Alcohol use: Yes    Alcohol/week: 1.0 standard drinks    Types: 1 Cans of beer per week    Comment: weekly      Objective:   BP (!) 148/88 (BP Location: Right Arm, Patient Position: Sitting, Cuff Size: Large)   Pulse 88   Temp 98 F (36.7 C) (Oral)   Resp 16   Ht 5\' 10"  (1.778 m)   Wt 175 lb (79.4 kg)   BMI 25.11 kg/m  Vitals:   04/26/18 1002  BP: (!) 148/88  Pulse: 88  Resp: 16  Temp: 98 F (36.7 C)  TempSrc: Oral  Weight: 175 lb (79.4 kg)  Height: 5\' 10"  (1.778 m)     Physical Exam   General Appearance:    Alert, cooperative, no distress  Eyes:    PERRL, conjunctiva/corneas clear, EOM's intact       Lungs:     Clear to auscultation bilaterally, respirations unlabored  Heart:    Regular rate  and rhythm  Neurologic:   Awake, alert, oriented x 3. No apparent focal neurological           defect.       Results for orders placed or performed in visit on 04/26/18  POCT glycosylated hemoglobin (Hb A1C)  Result Value Ref Range   Hemoglobin A1C 9.9 (A) 4.0 - 5.6 %  POCT UA - Microalbumin  Result Value Ref Range   Microalbumin Ur, POC 20 mg/L       Assessment & Plan    1. Uncontrolled type 2 diabetes mellitus with hyperglycemia (HCC) a1c improving, but not too goal, has only recently been taking NPH consistently twice a day. Advised to continue taking BID, but may need to reduce evening dose of morning sugars stay below 100.   2. Other diabetic neurological complication associated with type 2 diabetes mellitus (HCC)  - POCT glycosylated hemoglobin (Hb A1C) - POCT UA - Microalbumin  3. Essential (primary) hypertension Not quite to goal, but improved since taking hctz consistently. Consider adding CCB if not continuing to improve.   4.  Hypercholesterolemia He is tolerating lovastatin well with no adverse effects.  Check lipids at follow up in 3 months.      Lelon Huh, MD  [facility]

## 2018-04-26 NOTE — Patient Instructions (Addendum)
.   Please review the attached list of medications and notify my office if there are any errors.   . Please bring all of your medications to every appointment so we can make sure that our medication list is the same as yours.    Continue NPH insulin 82m units twice a day       If your morning sugars stay below 100 then you can reduce nighttime dose of NPH to 15 units   Check blood sugar before each meal      If blood sugar is >=150, but <250, take 4 units Regular insulin     If blood sugar is >=250, but <300, take 6 units Regular insulin     If blood sugar is >=300, but <350, take 8 units Regular insulin     If blood sugar is >=350, but <400, take 10 units Regular insulin     If blood sugar is >400, take 12 units Regular insulin

## 2018-05-14 ENCOUNTER — Emergency Department
Admission: EM | Admit: 2018-05-14 | Discharge: 2018-05-14 | Disposition: A | Discharge status: Home / Self Care | Payer: Medicare Other | Attending: Emergency Medicine | Admitting: Emergency Medicine

## 2018-05-14 ENCOUNTER — Other Ambulatory Visit: Payer: Self-pay

## 2018-05-14 DIAGNOSIS — Z7982 Long term (current) use of aspirin: Secondary | ICD-10-CM | POA: Diagnosis not present

## 2018-05-14 DIAGNOSIS — E162 Hypoglycemia, unspecified: Secondary | ICD-10-CM

## 2018-05-14 DIAGNOSIS — Z79899 Other long term (current) drug therapy: Secondary | ICD-10-CM | POA: Insufficient documentation

## 2018-05-14 DIAGNOSIS — R410 Disorientation, unspecified: Secondary | ICD-10-CM | POA: Diagnosis not present

## 2018-05-14 DIAGNOSIS — I10 Essential (primary) hypertension: Secondary | ICD-10-CM | POA: Insufficient documentation

## 2018-05-14 DIAGNOSIS — E11649 Type 2 diabetes mellitus with hypoglycemia without coma: Secondary | ICD-10-CM | POA: Insufficient documentation

## 2018-05-14 DIAGNOSIS — E1165 Type 2 diabetes mellitus with hyperglycemia: Secondary | ICD-10-CM | POA: Diagnosis not present

## 2018-05-14 DIAGNOSIS — E161 Other hypoglycemia: Secondary | ICD-10-CM | POA: Diagnosis not present

## 2018-05-14 DIAGNOSIS — R404 Transient alteration of awareness: Secondary | ICD-10-CM | POA: Diagnosis not present

## 2018-05-14 LAB — GLUCOSE, CAPILLARY
GLUCOSE-CAPILLARY: 194 mg/dL — AB (ref 70–99)
Glucose-Capillary: 88 mg/dL (ref 70–99)

## 2018-05-14 NOTE — ED Notes (Signed)
MD at bedside. 

## 2018-05-14 NOTE — ED Notes (Signed)
Pt has [place] sandwich tray and juice at bedside.

## 2018-05-14 NOTE — Discharge Instructions (Addendum)
If you take your insulin at night make sure you eat in the morning.  return to the emergency room for any new or worrisome symptoms.

## 2018-05-14 NOTE — ED Notes (Signed)
Family at bedside. 

## 2018-05-14 NOTE — ED Triage Notes (Signed)
Hypoglycemic this AM. CBG 53 with EMS. 3 times CBG has dropped. "animated when sugar is low." CBG 55 with EMS. glucose tube completed. VSS.   Arrives A&O, ambulatory. Type 1 DM. Takes insulin. Hasn't eaten anything today.

## 2018-05-14 NOTE — ED Notes (Signed)
States he took 35 units of levemir last night. States "it calls for 40 units but my sugars weren't really that high."   Pt denies dizziness at this time. Speaking in complete sentences. Moving around on own. No diaphoresis noted.

## 2018-05-14 NOTE — ED Notes (Signed)
Family had brought pt [employer] to eat. Pt denies any pain or dizziness. Ambulating without difficulty. Family taking pt home.

## 2018-05-14 NOTE — ED Provider Notes (Addendum)
[facility] Emergency Department Provider Note  ____________________________________________   I have reviewed the triage vital signs and the nursing notes. Where available I have reviewed prior notes and, if possible and indicated, outside hospital notes.    HISTORY  Chief Complaint Hypoglycemia    HPI Nicholas Weiss is a 68 y.o. male with a history of diabetes mellitus, he takes insulin, has some issues with compliance, took his insulin last night did not have anything to eat or drink this morning he states, and had low sugars.  Has not yet had anything to eat or drink.  Last sugar was 83 here.  States he is hungry.  No other complaints.     Past Medical History:  Diagnosis Date  . Diabetes mellitus without complication ([place])   . DKA (diabetic ketoacidoses) ([place]) 04/06/2016  . Hypercholesteremia   . Hypertension     Patient Active Problem List   Diagnosis Date Noted  . Hypercholesterolemia 01/24/2016  . Essential (primary) hypertension 06/07/2006  . DM neuropathy with neurologic complication ([place]) 78/58/8502    Past Surgical History:  Procedure Laterality Date  . KNEE SURGERY Right    Torn meniscus  . KNEE SURGERY Left     Prior to Admission medications   Medication Sig Start Date End Date Taking? Authorizing Provider  amitriptyline (ELAVIL) 10 MG tablet Start one tablet at bedtime x 4 days, then increase to two at bedtime x 4 days, then 3 at bedtime if needed for tingling in feet Patient taking differently: Take 30 mg by mouth at bedtime. Start one tablet at bedtime x 4 days, then increase to two at bedtime x 4 days, then 3 at bedtime if needed for tingling in feet 01/03/18   Birdie Sons, MD  aspirin 81 MG chewable tablet Chew 1 tablet by mouth daily.    [provider]  diclofenac sodium (VOLTAREN) 1 % GEL Apply 2 g topically 4 (four) times daily. Patient not taking: Reported on 04/02/2018 08/27/17   Laban Emperor, PA-C   famotidine (PEPCID) 20 MG tablet Take 1 tablet (20 mg total) by mouth 2 (two) times daily. 04/02/18 04/02/19  Darel Hong, MD  gabapentin (NEURONTIN) 100 MG capsule Take one at night for 4 days, then increase to two at night for 4 days, then increase to 3a night if needed. Patient taking differently: Take 300 mg by mouth at bedtime. Take one at night for 4 days, then increase to two at night for 4 days, then increase to 3a night if needed. 07/27/17   Birdie Sons, MD  hydrochlorothiazide (HYDRODIURIL) 50 MG tablet Take 1 tablet (50 mg total) by mouth daily. 01/20/17   Birdie Sons, MD  insulin NPH Human (HUMULIN N,NOVOLIN N) 100 UNIT/ML injection 20 units twice a day 01/03/18   Birdie Sons, MD  insulin regular (NOVOLIN R RELION) 100 units/mL injection Inject 0.04-0.12 mLs (4-12 Units total) into the skin 3 (three) times daily before meals. According to sliding scale 01/03/18   Birdie Sons, MD  lovastatin (MEVACOR) 40 MG tablet Take 1 tablet (40 mg total) by mouth at bedtime. 04/26/18   Birdie Sons, MD  pantoprazole (PROTONIX) 40 MG tablet Take 1 tablet (40 mg total) by mouth daily. Patient not taking: Reported on 04/26/2018 04/02/18 04/02/19  Darel Hong, MD    Allergies Penicillins  Family History  Problem Relation Age of Onset  . Heart attack Father   . Hypertension Sister   . Cancer Sister  Social History Social History   Tobacco Use  . Smoking status: Never Smoker  . Smokeless tobacco: Never Used  Substance Use Topics  . Alcohol use: Yes    Alcohol/week: 1.0 standard drinks    Types: 1 Cans of beer per week    Comment: weekly  . Drug use: Yes    Types: Marijuana    Comment: Last used 01/08/15    Review of Systems Constitutional: No fever/chills Eyes: No visual changes. ENT: No sore throat. No stiff neck no neck pain Cardiovascular: Denies chest pain. Respiratory: Denies shortness of breath. Gastrointestinal:   no vomiting.  No diarrhea.  No  constipation. Genitourinary: Negative for dysuria. Musculoskeletal: Negative lower extremity swelling Skin: Negative for rash. Neurological: Negative for severe headaches, focal weakness or numbness.   ____________________________________________   PHYSICAL EXAM:  VITAL SIGNS: ED Triage Vitals  Enc Vitals Group     BP 05/14/18 1105 (!) 164/82     Pulse Rate 05/14/18 1105 97     Resp 05/14/18 1105 18     Temp 05/14/18 1105 98.2 F (36.8 C)     Temp Source 05/14/18 1105 Oral     SpO2 05/14/18 1105 99 %     Weight 05/14/18 1101 172 lb (78 kg)     Height 05/14/18 1101 5\' 10"  (1.778 m)     Head Circumference --      Peak Flow --      Pain Score 05/14/18 1101 0     Pain Loc --      Pain Edu? --      Excl. in [place]? --     Constitutional: Alert and oriented. Well appearing and in no acute distress. Eyes: Conjunctivae are normal Head: Atraumatic HEENT: No congestion/rhinnorhea. Mucous membranes are moist.  Oropharynx non-erythematous Neck:   Nontender with no meningismus, no masses, no stridor Cardiovascular: Normal rate, regular rhythm. Grossly normal heart sounds.  Good peripheral circulation. Respiratory: Normal respiratory effort.  No retractions. Lungs CTAB. Abdominal: Soft and nontender. No distention. No guarding no rebound Back:  There is no focal tenderness or step off.  there is no midline tenderness there are no lesions noted. there is no CVA tenderness  Musculoskeletal: No lower extremity tenderness, no upper extremity tenderness. No joint effusions, no DVT signs strong distal pulses no edema Neurologic:  Normal speech and language. No gross focal neurologic deficits are appreciated.  Skin:  Skin is warm, dry and intact. No rash noted. Psychiatric: Mood and affect are normal. Speech and behavior are normal.  ____________________________________________   LABS (all labs ordered are listed, but only abnormal results are displayed)  Labs Reviewed  GLUCOSE, CAPILLARY     Pertinent labs  results that were available during my care of the patient were reviewed by me and considered in my medical decision making (see chart for details). ____________________________________________  EKG  I personally interpreted any EKGs ordered by me or triage  ____________________________________________  RADIOLOGY  Pertinent labs & imaging results that were available during my care of the patient were reviewed by me and considered in my medical decision making (see chart for details). If possible, patient and/or family made aware of any abnormal findings.  No results found. ____________________________________________    PROCEDURES  Procedure(s) performed: None  Procedures  Critical Care performed: None  ____________________________________________   INITIAL IMPRESSION / ASSESSMENT AND PLAN / ED COURSE  Pertinent labs & imaging results that were available during my care of the patient were reviewed by me and  considered in my medical decision making (see chart for details).  To medic patient here with recurrent hypoglycemia in the context of long entailing insulin usage and poor p.o. intake, he states he just did not feel like eating, however now he is very hungry, I have brought him food, he is eating, will monitor his sugars.  No other complaints or acute issues elicited on exam and work-up  ----------------------------------------- 1:01 PM on 05/14/2018 -----------------------------------------  Patient has finished eating the food that I provided him personally, and now is tucking into a [employer] sandwich.  He is evidence of other pathology identified, if he keeps eating we run the risk of hyperglycemia.  I think will probably discharge him before that.  Baseline sugars run high according to prior visits.  He is at 30 here has not been hypoglycemic here and has been eating nonstop we will hopefully get him home soon    ____________________________________________   FINAL CLINICAL IMPRESSION(S) / ED DIAGNOSES  Final diagnoses:  None      This chart was dictated using voice recognition software.  Despite best efforts to proofread,  errors can occur which can change meaning.     Schuyler Amor, MD 05/14/18 1153    Schuyler Amor, MD 05/14/18 1302

## 2018-06-22 ENCOUNTER — Other Ambulatory Visit: Payer: Self-pay | Admitting: Family Medicine

## 2018-07-28 ENCOUNTER — Ambulatory Visit: Payer: Medicare Other | Admitting: Family Medicine

## 2018-07-29 ENCOUNTER — Ambulatory Visit: Payer: Self-pay | Admitting: Family Medicine

## 2018-08-02 ENCOUNTER — Telehealth: Payer: Self-pay

## 2018-08-02 ENCOUNTER — Ambulatory Visit (INDEPENDENT_AMBULATORY_CARE_PROVIDER_SITE_OTHER): Payer: Medicare Other | Admitting: Family Medicine

## 2018-08-02 ENCOUNTER — Other Ambulatory Visit: Payer: Self-pay

## 2018-08-02 ENCOUNTER — Encounter: Payer: Self-pay | Admitting: Family Medicine

## 2018-08-02 VITALS — BP 117/72 | HR 83 | Temp 97.7°F | Wt 167.0 lb

## 2018-08-02 DIAGNOSIS — R42 Dizziness and giddiness: Secondary | ICD-10-CM | POA: Diagnosis not present

## 2018-08-02 DIAGNOSIS — E1149 Type 2 diabetes mellitus with other diabetic neurological complication: Secondary | ICD-10-CM

## 2018-08-02 DIAGNOSIS — N179 Acute kidney failure, unspecified: Secondary | ICD-10-CM

## 2018-08-02 DIAGNOSIS — R0600 Dyspnea, unspecified: Secondary | ICD-10-CM

## 2018-08-02 DIAGNOSIS — I10 Essential (primary) hypertension: Secondary | ICD-10-CM | POA: Diagnosis not present

## 2018-08-02 DIAGNOSIS — E7800 Pure hypercholesterolemia, unspecified: Secondary | ICD-10-CM

## 2018-08-02 LAB — POCT GLYCOSYLATED HEMOGLOBIN (HGB A1C): Hemoglobin A1C: 11.9 % — AB (ref 4.0–5.6)

## 2018-08-02 LAB — SPECIMEN STATUS REPORT

## 2018-08-02 MED ORDER — GABAPENTIN 100 MG PO CAPS: 300.0000 mg | ORAL_CAPSULE | Freq: Every day | ORAL | 5 refills | Status: DC

## 2018-08-02 MED ORDER — INSULIN DEGLUDEC 100 UNIT/ML ~~LOC~~ SOPN: PEN_INJECTOR | SUBCUTANEOUS | 0 refills | Status: DC

## 2018-08-02 MED ORDER — LOVASTATIN 40 MG PO TABS: 40.0000 mg | ORAL_TABLET | Freq: Every day | ORAL | 4 refills | Status: DC

## 2018-08-02 NOTE — Telephone Encounter (Signed)
Have sent new prescription with quantity of 90

## 2018-08-02 NOTE — Patient Instructions (Signed)
.   Please review the attached list of medications and notify my office if there are any errors.   . Please bring all of your medications to every appointment so we can make sure that our medication list is the same as yours.    Stop taking NPH insulin and take samples of Tresiba instead        Titrate dose of Tresiba to get your fasting sugars between 90 and 120.        Start Tresiba by taking 30 units every day Check fasting blood sugar every day. If fasting blood sugar is greater or equal to 200, take 2 units more than the day before. If fasting blood sugar is greater or equal to 120, but less than 200, take 1 unit more than the day before. If fasting blood sugar is less than 120, continue the same dose as the day before. If fasting blood sugar is less than 90, take 2 units LESS than the day before. If fasting blood sugar is less than 60, contact your healthcare provider. Do not take Insulin unless told otherwise by your provider.  Example:  Let's say that yesterday you took 30 units of Tresiba.  You check your blood sugar today before eating anything and it is 210.  You will INCREASE your dose by 2 units, so you will take 32 units today.  The next day you check your fasting blood sugar and it is 150.  You will INCREASE your dose another 1 unit, so you will take 33 units.  The next day you check your fasting blood sugar and it is 110.  You will continue 33 units of Tresiba per day.

## 2018-08-02 NOTE — Telephone Encounter (Signed)
Please advise patient he is very dehydrated, he needs to drink much more water. Need to recheck renal panel in 2 days to make sure it is improving. If he starts feeling any worse then he will need to go to the ER.

## 2018-08-02 NOTE — Telephone Encounter (Signed)
[place] needed clarification on Gabapentin.   The directions say to take three at bedtime but only dispense 30.  Should it be 90 with 5 refills?    Thanks,   -Mickel Baas

## 2018-08-02 NOTE — Progress Notes (Signed)
Patient: Nicholas Weiss Male    DOB: [birth year]   68 y.o.   MRN: [id number] Visit Date: 08/02/2018  Today's Provider: Lelon Huh, MD   Chief Complaint  Patient presents with  . Diabetes  . Hypertension  . Hyperlipidemia   Subjective:     HPI    Diabetes Mellitus Type II, Follow-up:   Lab Results  Component Value Date   HGBA1C 9.9 (A) 04/26/2018   HGBA1C 11.2 (A) 01/03/2018   HGBA1C 11.2 (A) 09/27/2017   Last seen for diabetes 4 months ago.  Management since then includes no changes. He reports excellent compliance with treatment. He is not having side effects.  Current symptoms include Numbness, Fatigue and have been stable. Home blood sugar records: fasting range: 100's  Episodes of hypoglycemia? On occasion   Current Insulin Regimen: Novolin NPH BID and sliding scale Novolin R Most Recent Eye Exam: Over a year Weight trend: stable Current diet: in general, a "healthy" diet   Current exercise: Some  ------------------------------------------------------------------------   Hypertension, follow-up:  BP Readings from Last 3 Encounters:  08/02/18 117/72  05/14/18 (!) 168/101  04/26/18 (!) 148/88    He was last seen for hypertension 4 months ago.  BP at that visit was 148/88. Management since that visit includes No changes He reports excellent compliance with treatment. He is not having side effects.  He is exercising. He is adherent to low salt diet.   Outside blood pressures are 160-180's/90/100's. He is experiencing dyspnea and lower extremity edema.  Patient denies chest pain, palpitations and syncope.   Cardiovascular risk factors include advanced age (older than 69 for men, 37 for women), diabetes mellitus, dyslipidemia, hypertension and male gender.  Use of agents associated with hypertension: none.   ------------------------------------------------------------------------    Lipid/Cholesterol, Follow-up:   Last seen for this 4  months ago.  Management since that visit includes no changes.  Last Lipid Panel: No results found for: CHOL, TRIG, HDL, CHOLHDL, VLDL, LDLCALC, LDLDIRECT  He reports excellent compliance with treatment. He is not having side effects.   Wt Readings from Last 3 Encounters:  08/02/18 167 lb (75.8 kg)  05/14/18 172 lb (78 kg)  04/26/18 175 lb (79.4 kg)    ------------------------------------------------------------------------  He also reports that he has been extremely fatigued for the last day. Started after mowing yesterday when the temperature was in the 80s. He checked his sugar which was just over 100. Denies any chest pain or pressure, but has felt a little short of breath. No coughing or other URI sx.   Allergies  Allergen Reactions  . Penicillins Other (See Comments)    Syncope  Has patient had a PCN reaction causing immediate rash, facial/tongue/throat swelling, SOB or lightheadedness with hypotension: No Has patient had a PCN reaction causing severe rash involving mucus membranes or skin necrosis: No Has patient had a PCN reaction that required hospitalization No Has patient had a PCN reaction occurring within the last 10 years: No If all of the above answers are "NO", then may proceed with Cephalosporin use.      Current Outpatient Medications:  .  amitriptyline (ELAVIL) 10 MG tablet, Start one tablet at bedtime x 4 days, then increase to two at bedtime x 4 days, then 3 at bedtime if needed for tingling in feet (Patient taking differently: Take 30 mg by mouth at bedtime. Start one tablet at bedtime x 4 days, then increase to two at bedtime x 4 days, then  3 at bedtime if needed for tingling in feet), Disp: 60 tablet, Rfl: 4 .  aspirin 81 MG chewable tablet, Chew 1 tablet by mouth daily., Disp: , Rfl:  .  famotidine (PEPCID) 20 MG tablet, Take 1 tablet (20 mg total) by mouth 2 (two) times daily., Disp: 60 tablet, Rfl: 0 .  gabapentin (NEURONTIN) 100 MG capsule, Take one at  night for 4 days, then increase to two at night for 4 days, then increase to 3a night if needed. (Patient taking differently: Take 300 mg by mouth at bedtime. Take one at night for 4 days, then increase to two at night for 4 days, then increase to 3a night if needed.), Disp: 90 capsule, Rfl: 5 .  hydrochlorothiazide (HYDRODIURIL) 50 MG tablet, Take 1 tablet by mouth once daily, Disp: 90 tablet, Rfl: 3 .  insulin NPH Human (HUMULIN N,NOVOLIN N) 100 UNIT/ML injection, 20 units twice a day, Disp: 10 mL, Rfl: 5 .  insulin regular (NOVOLIN R RELION) 100 units/mL injection, Inject 0.04-0.12 mLs (4-12 Units total) into the skin 3 (three) times daily before meals. According to sliding scale, Disp: 10 mL, Rfl: 11 .  lovastatin (MEVACOR) 40 MG tablet, Take 1 tablet (40 mg total) by mouth at bedtime., Disp: 90 tablet, Rfl: 4 .  diclofenac sodium (VOLTAREN) 1 % GEL, Apply 2 g topically 4 (four) times daily. (Patient not taking: Reported on 04/02/2018), Disp: 100 g, Rfl: 0 .  pantoprazole (PROTONIX) 40 MG tablet, Take 1 tablet (40 mg total) by mouth daily. (Patient not taking: Reported on 04/26/2018), Disp: 30 tablet, Rfl: 0  Review of Systems  Constitutional: Positive for fatigue. Negative for activity change, appetite change, chills, diaphoresis, fever and unexpected weight change.  Respiratory: Positive for shortness of breath. Negative for apnea, cough, choking, chest tightness, wheezing and stridor.   Cardiovascular: Positive for leg swelling. Negative for chest pain and palpitations.  Neurological: Positive for numbness.    Social History   Tobacco Use  . Smoking status: Never Smoker  . Smokeless tobacco: Never Used  Substance Use Topics  . Alcohol use: Yes    Alcohol/week: 1.0 standard drinks    Types: 1 Cans of beer per week    Comment: weekly      Objective:   BP 117/72 (BP Location: Right Arm, Patient Position: Sitting, Cuff Size: Normal)   Pulse 83   Temp 97.7 F (36.5 C) (Oral)   Wt 167  lb (75.8 kg)   BMI 23.96 kg/m  Vitals:   08/02/18 0952  BP: 117/72  Pulse: 83  Temp: 97.7 F (36.5 C)  TempSrc: Oral  Weight: 167 lb (75.8 kg)     Physical Exam   General Appearance:    Alert, cooperative, no distress  Eyes:    PERRL, conjunctiva/corneas clear, EOM's intact       Lungs:     Clear to auscultation bilaterally, respirations unlabored  Heart:    Regular rate and rhythm  Neurologic:   Awake, alert, oriented x 3. No apparent focal neurological           defect.       Results for orders placed or performed in visit on 08/02/18  POCT glycosylated hemoglobin (Hb A1C)  Result Value Ref Range   Hemoglobin A1C 11.9 (A) 4.0 - 5.6 %       Assessment & Plan    1. Essential (primary) hypertension Well controlled.  Continue current medications.     2. Hypercholesterolemia Doing well on  current medications. refill lovastatin (MEVACOR) 40 MG tablet; Take 1 tablet (40 mg total) by mouth at bedtime.  Dispense: 90 tablet; Refill: 4  3. Other diabetic neurological complication associated with type 2 diabetes mellitus (HCC) Normal finger stick glucoses, but a1c continues to climb. Likely having labilty sugars change. NPH to samples of Tresiba as per PI.  - POCT glycosylated hemoglobin (Hb A1C)  refill- gabapentin (NEURONTIN) 100 MG capsule; Take 3 capsules (300 mg total) by mouth at bedtime.  Dispense: 30 capsule; Refill: 5  4. Dizziness Onset yesterday after mowing lawn in hot weather.   - Comprehensive metabolic panel - Brain natriuretic peptide - CBC - Troponin I  5. Dyspnea, unspecified type  - Comprehensive metabolic panel - Brain natriuretic peptide - CBC - Troponin I     Lelon Huh, MD  [facility]

## 2018-08-02 NOTE — Telephone Encounter (Signed)
Nicholas Weiss from [employer] with STAT results.  CMP: BUN 34 high Creatinine: 2.27 high egr non african: 29 low egr if African: 34 Ag Ratio: 1.0 Low  CBC/platelet: no differential not able to run-Hemolyze  Troponin: 0.04 in range BNP: 100.7 High

## 2018-08-03 LAB — COMPREHENSIVE METABOLIC PANEL
ALT: 13 IU/L (ref 0–44)
AST: 14 IU/L (ref 0–40)
Albumin/Globulin Ratio: 1 — ABNORMAL LOW (ref 1.2–2.2)
Albumin: 4 g/dL (ref 3.8–4.8)
Alkaline Phosphatase: 66 IU/L (ref 39–117)
BUN/Creatinine Ratio: 15 (ref 10–24)
BUN: 34 mg/dL — ABNORMAL HIGH (ref 8–27)
Bilirubin Total: 0.2 mg/dL (ref 0.0–1.2)
CO2: 22 mmol/L (ref 20–29)
Calcium: 9.5 mg/dL (ref 8.6–10.2)
Chloride: 100 mmol/L (ref 96–106)
Creatinine, Ser: 2.27 mg/dL — ABNORMAL HIGH (ref 0.76–1.27)
GFR calc Af Amer: 34 mL/min/{1.73_m2} — ABNORMAL LOW (ref >59)
GFR calc non Af Amer: 29 mL/min/{1.73_m2} — ABNORMAL LOW (ref >59)
Globulin, Total: 4 g/dL (ref 1.5–4.5)
Glucose: 83 mg/dL (ref 65–99)
Potassium: 4.2 mmol/L (ref 3.5–5.2)
Sodium: 137 mmol/L (ref 134–144)
Total Protein: 8 g/dL (ref 6.0–8.5)

## 2018-08-03 LAB — CBC

## 2018-08-03 LAB — TROPONIN I: Troponin I: 0.04 ng/mL (ref 0.00–0.04)

## 2018-08-03 LAB — BRAIN NATRIURETIC PEPTIDE: BNP: 100.7 pg/mL — ABNORMAL HIGH (ref 0.0–100.0)

## 2018-08-03 NOTE — Telephone Encounter (Signed)
Patient advised and verbally voiced understanding. Patient agrees to have blood work rechecked tomorrow morning 08/04/2018. Lab slip left at the front desk.

## 2018-08-04 DIAGNOSIS — N179 Acute kidney failure, unspecified: Secondary | ICD-10-CM | POA: Diagnosis not present

## 2018-08-05 ENCOUNTER — Telehealth: Payer: Self-pay

## 2018-08-05 LAB — RENAL FUNCTION PANEL
Albumin: 3.9 g/dL (ref 3.8–4.8)
BUN/Creatinine Ratio: 16 (ref 10–24)
BUN: 31 mg/dL — ABNORMAL HIGH (ref 8–27)
CO2: 23 mmol/L (ref 20–29)
Calcium: 9.1 mg/dL (ref 8.6–10.2)
Chloride: 98 mmol/L (ref 96–106)
Creatinine, Ser: 1.97 mg/dL — ABNORMAL HIGH (ref 0.76–1.27)
GFR calc Af Amer: 40 mL/min/{1.73_m2} — ABNORMAL LOW (ref >59)
GFR calc non Af Amer: 35 mL/min/{1.73_m2} — ABNORMAL LOW (ref >59)
Glucose: 440 mg/dL — ABNORMAL HIGH (ref 65–99)
Phosphorus: 3.7 mg/dL (ref 2.8–4.1)
Potassium: 4.9 mmol/L (ref 3.5–5.2)
Sodium: 133 mmol/L — ABNORMAL LOW (ref 134–144)

## 2018-08-05 NOTE — Telephone Encounter (Signed)
-----   Message from Birdie Sons, MD sent at 08/05/2018  8:17 AM EDT ----- Kidney functions are better, but not back to baseline, is still a little dehydrated. Keep drinking plenty of fluids. Continue the samples of tresiba insulin and sliding scale novolin. We'll get in touch with him next week to see how his sugars are doing.

## 2018-08-05 NOTE — Telephone Encounter (Signed)
Left message to call back  

## 2018-08-08 NOTE — Telephone Encounter (Signed)
Patient advised and verbally voiced understanding.  

## 2018-08-16 ENCOUNTER — Telehealth: Payer: Self-pay | Admitting: Family Medicine

## 2018-08-16 NOTE — Telephone Encounter (Signed)
Tried calling; no answer.  08/16/2018   Thanks,   -Mickel Baas

## 2018-08-16 NOTE — Telephone Encounter (Signed)
-----   Message from Birdie Sons, MD sent at 08/05/2018  8:25 AM EDT ----- Regarding: FW: check to see how sugars are doing with samples of [name]  ----- Message ----- From: Birdie Sons, MD Sent: 08/05/2018   8:17 AM EDT To: Birdie Sons, MD Subject: check to see how sugars are doing with sampl#

## 2018-08-16 NOTE — Telephone Encounter (Signed)
Patient was changed from NPH insulin to samples of Tresiba at 08-02-2018 o.v. please check with patient and see what his sugars are running and how much tresiba he is using. If running low we can get more samples. Thanks!

## 2018-08-23 NOTE — Telephone Encounter (Signed)
Tried calling patient. No answer or voice message system.

## 2018-08-24 NOTE — Telephone Encounter (Signed)
Tried calling patient, no answer. Letter mailed to call the office and discuss medications.

## 2018-10-17 ENCOUNTER — Emergency Department
Admission: EM | Admit: 2018-10-17 | Discharge: 2018-10-17 | Disposition: A | Discharge status: Home / Self Care | Payer: Medicare HMO | Attending: Emergency Medicine | Admitting: Emergency Medicine

## 2018-10-17 ENCOUNTER — Other Ambulatory Visit: Payer: Self-pay

## 2018-10-17 ENCOUNTER — Emergency Department: Payer: Medicare HMO

## 2018-10-17 ENCOUNTER — Encounter: Payer: Self-pay | Admitting: Emergency Medicine

## 2018-10-17 DIAGNOSIS — Z794 Long term (current) use of insulin: Secondary | ICD-10-CM | POA: Diagnosis not present

## 2018-10-17 DIAGNOSIS — E860 Dehydration: Secondary | ICD-10-CM | POA: Diagnosis not present

## 2018-10-17 DIAGNOSIS — Z79899 Other long term (current) drug therapy: Secondary | ICD-10-CM | POA: Insufficient documentation

## 2018-10-17 DIAGNOSIS — R531 Weakness: Secondary | ICD-10-CM | POA: Insufficient documentation

## 2018-10-17 DIAGNOSIS — E1165 Type 2 diabetes mellitus with hyperglycemia: Secondary | ICD-10-CM | POA: Diagnosis not present

## 2018-10-17 DIAGNOSIS — E119 Type 2 diabetes mellitus without complications: Secondary | ICD-10-CM | POA: Diagnosis not present

## 2018-10-17 DIAGNOSIS — Z7982 Long term (current) use of aspirin: Secondary | ICD-10-CM | POA: Insufficient documentation

## 2018-10-17 DIAGNOSIS — I10 Essential (primary) hypertension: Secondary | ICD-10-CM | POA: Insufficient documentation

## 2018-10-17 LAB — CBC
HCT: 32.2 % — ABNORMAL LOW (ref 39.0–52.0)
Hemoglobin: 10.8 g/dL — ABNORMAL LOW (ref 13.0–17.0)
MCH: 30.1 pg (ref 26.0–34.0)
MCHC: 33.5 g/dL (ref 30.0–36.0)
MCV: 89.7 fL (ref 80.0–100.0)
Platelets: 251 10*3/uL (ref 150–400)
RBC: 3.59 MIL/uL — ABNORMAL LOW (ref 4.22–5.81)
RDW: 12.4 % (ref 11.5–15.5)
WBC: 4.3 10*3/uL (ref 4.0–10.5)
nRBC: 0 % (ref 0.0–0.2)

## 2018-10-17 LAB — COMPREHENSIVE METABOLIC PANEL
ALT: 13 U/L (ref 0–44)
AST: 16 U/L (ref 15–41)
Albumin: 3.6 g/dL (ref 3.5–5.0)
Alkaline Phosphatase: 56 U/L (ref 38–126)
Anion gap: 10 (ref 5–15)
BUN: 45 mg/dL — ABNORMAL HIGH (ref 8–23)
CO2: 23 mmol/L (ref 22–32)
Calcium: 9 mg/dL (ref 8.9–10.3)
Chloride: 104 mmol/L (ref 98–111)
Creatinine, Ser: 2.65 mg/dL — ABNORMAL HIGH (ref 0.61–1.24)
GFR calc Af Amer: 28 mL/min — ABNORMAL LOW (ref >60)
GFR calc non Af Amer: 24 mL/min — ABNORMAL LOW (ref >60)
Glucose, Bld: 315 mg/dL — ABNORMAL HIGH (ref 70–99)
Potassium: 3.6 mmol/L (ref 3.5–5.1)
Sodium: 137 mmol/L (ref 135–145)
Total Bilirubin: 0.8 mg/dL (ref 0.3–1.2)
Total Protein: 8 g/dL (ref 6.5–8.1)

## 2018-10-17 LAB — URINALYSIS, COMPLETE (UACMP) WITH MICROSCOPIC
Bacteria, UA: NONE SEEN
Bilirubin Urine: NEGATIVE
Glucose, UA: >=500 mg/dL — AB
Hgb urine dipstick: NEGATIVE
Ketones, ur: 20 mg/dL — AB
Leukocytes,Ua: NEGATIVE
Nitrite: NEGATIVE
Protein, ur: NEGATIVE mg/dL
Specific Gravity, Urine: 1.018 (ref 1.005–1.030)
pH: 5 (ref 5.0–8.0)

## 2018-10-17 LAB — GLUCOSE, CAPILLARY
Glucose-Capillary: 298 mg/dL — ABNORMAL HIGH (ref 70–99)
Glucose-Capillary: 258 mg/dL — ABNORMAL HIGH (ref 70–99)
Glucose-Capillary: 348 mg/dL — ABNORMAL HIGH (ref 70–99)
Glucose-Capillary: 367 mg/dL — ABNORMAL HIGH (ref 70–99)

## 2018-10-17 IMAGING — DX PORTABLE CHEST - 1 VIEW
1 series · 2 of 2 positions shown · non-contrast
Comparison: None.

CLINICAL DATA: Generalized weakness.

EXAM:
PORTABLE CHEST 1 VIEW

[Series 1: chest ap · 0.14mm/px · 2 of 2 slices shown]
[im 1/2]
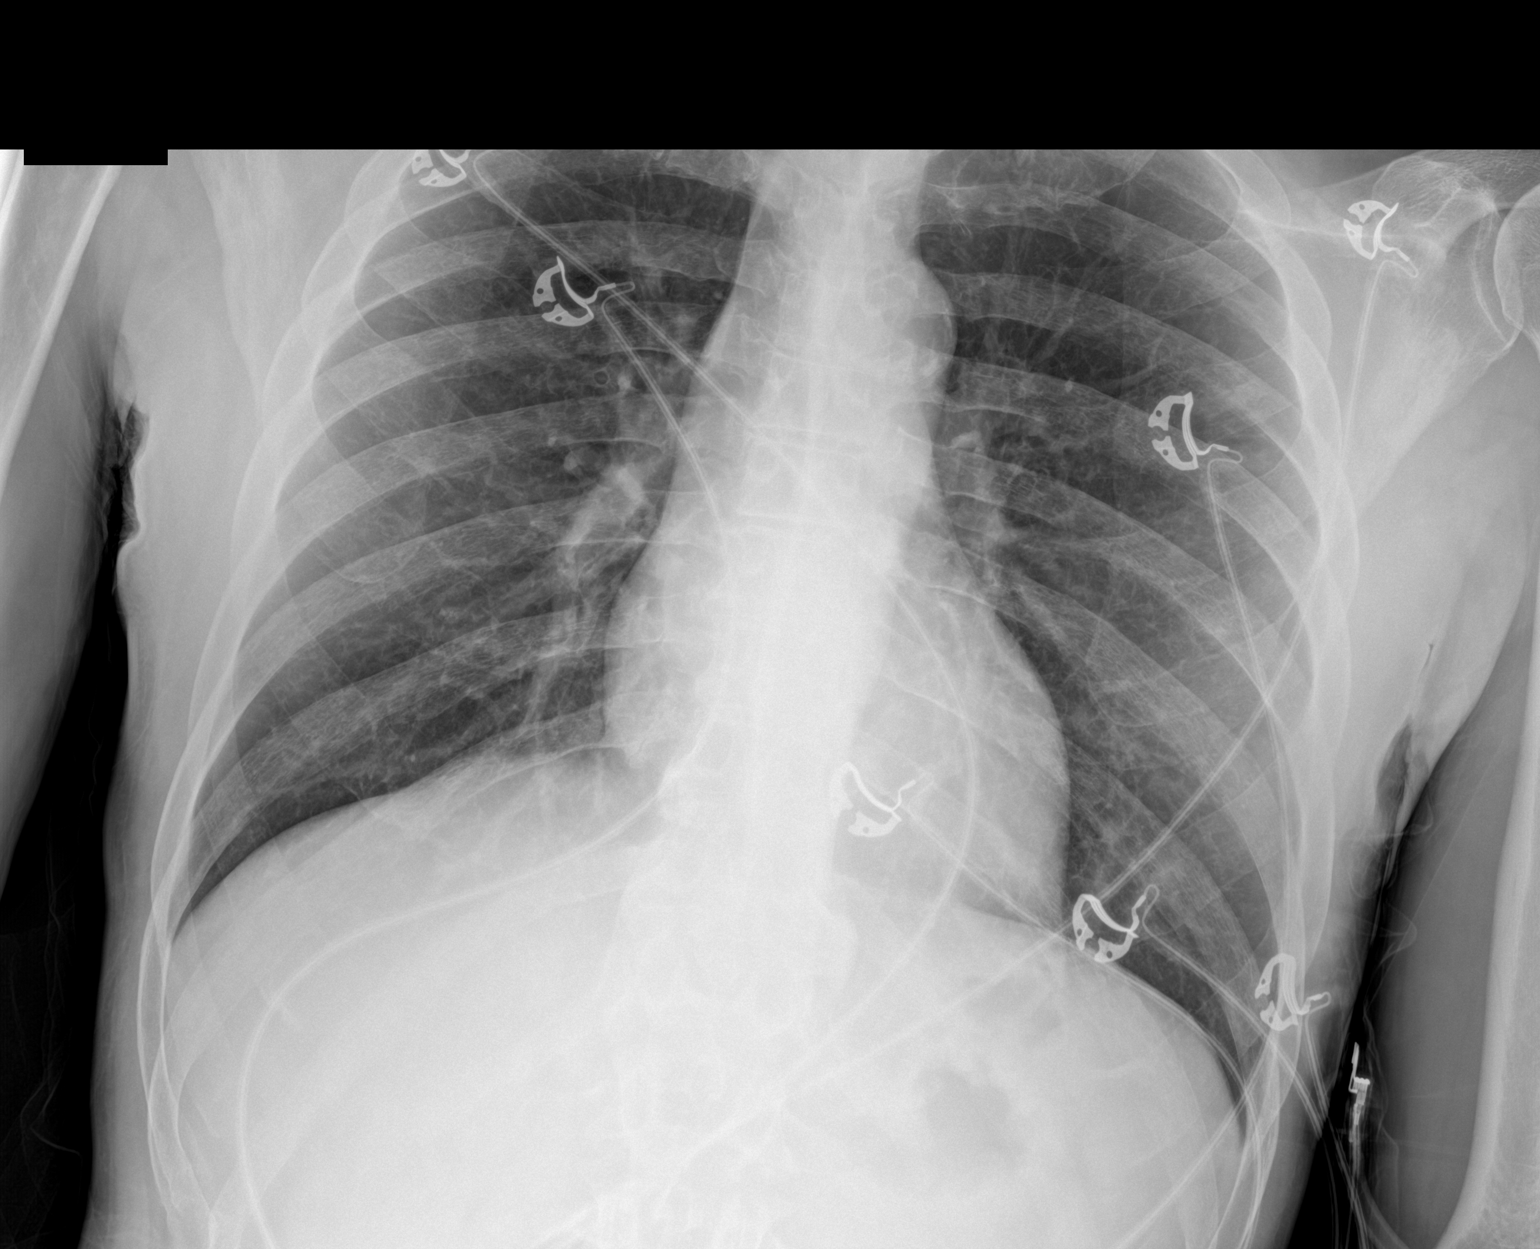
[im 2/2]
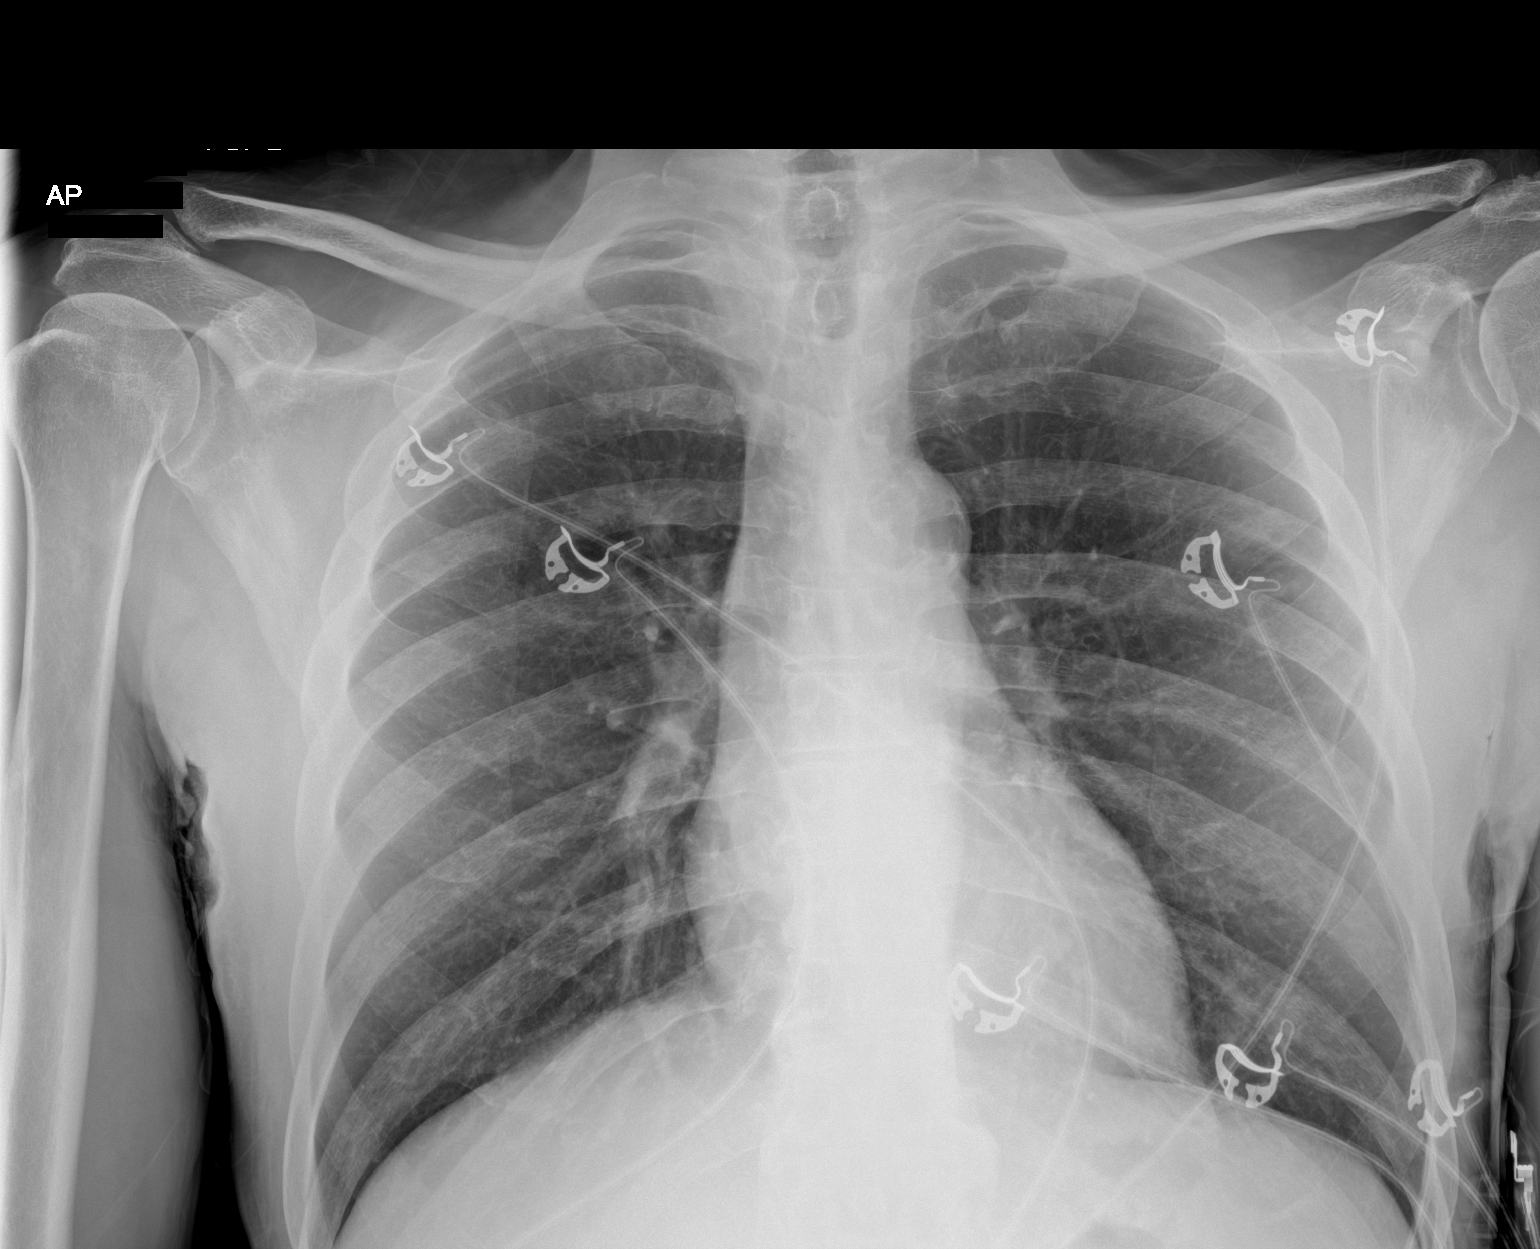

[2 of 2 positions shown; findings below may reference images not displayed]

FINDINGS: The heart size and mediastinal contours are within normal limits.
Both lungs are clear. The visualized skeletal structures are
unremarkable.
IMPRESSION: No active disease.

## 2018-10-17 MED ORDER — INSULIN ASPART 100 UNIT/ML ~~LOC~~ SOLN
6.0000 [IU] | Freq: Once | SUBCUTANEOUS | Status: DC
  Filled 2018-10-17: qty 1

## 2018-10-17 MED ORDER — SODIUM CHLORIDE 0.9 % IV BOLUS
1000.0000 mL | Freq: Once | INTRAVENOUS | Status: AC
  Administered 2018-10-17: 1000 mL via INTRAVENOUS

## 2018-10-17 MED ORDER — SODIUM CHLORIDE 0.9 % IV BOLUS
500.0000 mL | Freq: Once | INTRAVENOUS | Status: AC
  Administered 2018-10-17: 19:00:00 500 mL via INTRAVENOUS

## 2018-10-17 MED ORDER — SODIUM CHLORIDE 0.9 % IV BOLUS: 1000.0000 mL | Freq: Once | INTRAVENOUS | Status: DC

## 2018-10-17 MED ORDER — INSULIN ASPART 100 UNIT/ML ~~LOC~~ SOLN
5.0000 [IU] | Freq: Once | SUBCUTANEOUS | Status: AC
  Administered 2018-10-17: 5 [IU] via INTRAVENOUS
  Filled 2018-10-17: qty 1

## 2018-10-17 NOTE — ED Notes (Signed)
Patient given a diet ED sandwich tray and diet Ginger ale per Dr. Burlene Arnt.

## 2018-10-17 NOTE — ED Provider Notes (Addendum)
[facility] Emergency Department Provider Note  ____________________________________________   I have reviewed the triage vital signs and the nursing notes. Where available I have reviewed prior notes and, if possible and indicated, outside hospital notes.    HISTORY  Chief Complaint Weakness    HPI Nicholas Weiss is a 68 y.o. male with a history of diabetes mellitus, poor compliance, hypercholesterolemia hypertension, states that he has been having a little trouble controlling his sugar over the last couple days, see below so he did not take his insulin at some point and then it was high so he did take it.  He took his insulin most recently this morning and is been trending down.  He felt a little lightheaded.  He thinks he is getting little dehydrated because when his sugar is high he urinates a lot.  Denies dysuria or urinary frequency.  Denies chest pain shortness of breath focal numbness or weakness.  Did not fall did not hit his head.  He is eating and drinking well would like to eat and drink at this time.  No other complaints.   Past Medical History:  Diagnosis Date  . Diabetes mellitus without complication ([place])   . DKA (diabetic ketoacidoses) ([place]) 04/06/2016  . Hypercholesteremia   . Hypertension     Patient Active Problem List   Diagnosis Date Noted  . Hypercholesterolemia 01/24/2016  . Essential (primary) hypertension 06/07/2006  . DM neuropathy with neurologic complication ([place]) 02/54/2706    Past Surgical History:  Procedure Laterality Date  . KNEE SURGERY Right    Torn meniscus  . KNEE SURGERY Left     Prior to Admission medications   Medication Sig Start Date End Date Taking? Authorizing Provider  amitriptyline (ELAVIL) 10 MG tablet Start one tablet at bedtime x 4 days, then increase to two at bedtime x 4 days, then 3 at bedtime if needed for tingling in feet Patient taking differently: Take 30 mg by mouth at bedtime. Start one tablet  at bedtime x 4 days, then increase to two at bedtime x 4 days, then 3 at bedtime if needed for tingling in feet 01/03/18   Birdie Sons, MD  aspirin 81 MG chewable tablet Chew 1 tablet by mouth daily.    [provider]  diclofenac sodium (VOLTAREN) 1 % GEL Apply 2 g topically 4 (four) times daily. Patient not taking: Reported on 04/02/2018 08/27/17   Laban Emperor, PA-C  famotidine (PEPCID) 20 MG tablet Take 1 tablet (20 mg total) by mouth 2 (two) times daily. 04/02/18 04/02/19  Darel Hong, MD  gabapentin (NEURONTIN) 100 MG capsule Take 3 capsules (300 mg total) by mouth at bedtime. PLEASE DISREGARD PREVIOUS PRESCRIPTION FOR THAT WAS FOR 30 TABLETS 08/02/18   Birdie Sons, MD  hydrochlorothiazide (HYDRODIURIL) 50 MG tablet Take 1 tablet by mouth once daily 06/22/18   Birdie Sons, MD  insulin degludec (TRESIBA FLEXTOUCH) 100 UNIT/ML SOPN FlexTouch Pen Start at 30 units once a day, increase as directed to keep fasting glucose <120 08/02/18   Birdie Sons, MD  insulin regular (NOVOLIN R RELION) 100 units/mL injection Inject 0.04-0.12 mLs (4-12 Units total) into the skin 3 (three) times daily before meals. According to sliding scale 01/03/18   Birdie Sons, MD  lovastatin (MEVACOR) 40 MG tablet Take 1 tablet (40 mg total) by mouth at bedtime. 08/02/18   Birdie Sons, MD  pantoprazole (PROTONIX) 40 MG tablet Take 1 tablet (40 mg total) by  mouth daily. Patient not taking: Reported on 04/26/2018 04/02/18 04/02/19  Darel Hong, MD    Allergies Penicillins  Family History  Problem Relation Age of Onset  . Heart attack Father   . Hypertension Sister   . Cancer Sister     Social History Social History   Tobacco Use  . Smoking status: Never Smoker  . Smokeless tobacco: Never Used  Substance Use Topics  . Alcohol use: Yes    Alcohol/week: 1.0 standard drinks    Types: 1 Cans of beer per week    Comment: weekly  . Drug use: Yes    Types: Marijuana    Comment: Last used  01/08/15    Review of Systems Constitutional: No fever/chills Eyes: No visual changes. ENT: No sore throat. No stiff neck no neck pain Cardiovascular: Denies chest pain. Respiratory: Denies shortness of breath. Gastrointestinal:   no vomiting.  No diarrhea.  No constipation. Genitourinary: Negative for dysuria. Musculoskeletal: Negative lower extremity swelling Skin: Negative for rash. Neurological: Negative for severe headaches, focal weakness or numbness.   ____________________________________________   PHYSICAL EXAM:  VITAL SIGNS: ED Triage Vitals  Enc Vitals Group     BP 10/17/18 1422 102/70     Pulse Rate 10/17/18 1422 88     Resp 10/17/18 1422 14     Temp 10/17/18 1422 98.1 F (36.7 C)     Temp Source 10/17/18 1422 Oral     SpO2 10/17/18 1422 97 %     Weight 10/17/18 1414 160 lb (72.6 kg)     Height 10/17/18 1414 5\' 11"  (1.803 m)     Head Circumference --      Peak Flow --      Pain Score 10/17/18 1414 0     Pain Loc --      Pain Edu? --      Excl. in [place]? --     Constitutional: Alert and oriented. Well appearing and in no acute distress. Eyes: Conjunctivae are normal Head: Atraumatic HEENT: No congestion/rhinnorhea. Mucous membranes are dry.  Oropharynx non-erythematous Neck:   Nontender with no meningismus, no masses, no stridor Cardiovascular: Normal rate, regular rhythm. Grossly normal heart sounds.  Good peripheral circulation. Respiratory: Normal respiratory effort.  No retractions. Lungs CTAB. Abdominal: Soft and nontender. No distention. No guarding no rebound Back:  There is no focal tenderness or step off.  there is no midline tenderness there are no lesions noted. there is no CVA tenderness Musculoskeletal: No lower extremity tenderness, no upper extremity tenderness. No joint effusions, no DVT signs strong distal pulses no edema Neurologic:  Normal speech and language. No gross focal neurologic deficits are appreciated.  Skin:  Skin is warm, dry  and intact. No rash noted. Psychiatric: Mood and affect are normal. Speech and behavior are normal.  ____________________________________________   LABS (all labs ordered are listed, but only abnormal results are displayed)  Labs Reviewed  CBC - Abnormal; Notable for the following components:      Result Value   RBC 3.59 (*)    Hemoglobin 10.8 (*)    HCT 32.2 (*)    All other components within normal limits  COMPREHENSIVE METABOLIC PANEL - Abnormal; Notable for the following components:   Glucose, Bld 315 (*)    BUN 45 (*)    Creatinine, Ser 2.65 (*)    GFR calc non Af Amer 24 (*)    GFR calc Af Amer 28 (*)    All other components within normal limits  GLUCOSE, CAPILLARY - Abnormal; Notable for the following components:   Glucose-Capillary 298 (*)    All other components within normal limits  GLUCOSE, CAPILLARY - Abnormal; Notable for the following components:   Glucose-Capillary 258 (*)    All other components within normal limits  URINALYSIS, COMPLETE (UACMP) WITH MICROSCOPIC  CBG MONITORING, ED    Pertinent labs  results that were available during my care of the patient were reviewed by me and considered in my medical decision making (see chart for details). ____________________________________________  EKG  I personally interpreted any EKGs ordered by me or triage Sinus rhythm, prolonged PR interval, flipped T waves noted inferiorly and laterally which are consistent with multiple prior EKGs. ____________________________________________  RADIOLOGY  Pertinent labs & imaging results that were available during my care of the patient were reviewed by me and considered in my medical decision making (see chart for details). If possible, patient and/or family made aware of any abnormal findings.  Dg Chest Port 1 View  Result Date: 10/17/2018 CLINICAL DATA:  Generalized weakness. EXAM: PORTABLE CHEST 1 VIEW COMPARISON:  None. FINDINGS: The heart size and mediastinal  contours are within normal limits. Both lungs are clear. The visualized skeletal structures are unremarkable. IMPRESSION: No active disease. Electronically Signed   By: Dorise Bullion III M.D   On: 10/17/2018 14:46   ____________________________________________    PROCEDURES  Procedure(s) performed: None  Procedures  Critical Care performed: None  ____________________________________________   INITIAL IMPRESSION / ASSESSMENT AND PLAN / ED COURSE  Pertinent labs & imaging results that were available during my care of the patient were reviewed by me and considered in my medical decision making (see chart for details).  Patient's complaint at this time really is that he wants to eat something.  He has elevated blood sugar but she is not markedly above his baseline, there is no evidence of acute DKA.  He has a history of poor compliance and poor control of his sugars.  Sometimes he runs high sometimes he runs low.  This is currently more of a primary care problem.  He does appear to be somewhat dehydrated, has chronic renal insufficiency and his BUN and creatinine are elevated I am going to give him copious IV fluids here.  His preference would not be admission at this time he states.  We will see if we can get him safely home with close outpatient follow-up.  Patient eating and drinking.  His sugar has been trending down since he took his insulin this morning.  ----------------------------------------- 6:30 PM on 10/17/2018 -----------------------------------------  No evidence of DKA sugars back up a bit after taking a full meal here, we will give him some insulin prior to discharge, however, asymptomatic hyperglycemia is sufficient to mandate admission.  Creatinine is as noted somewhat up from his otherwise elevated baseline however we have given him a liter and half of fluid here he no longer feels generally weak he feels 100% better and he is requesting discharge.  Multiple patients in  the department to have coronavirus.  Is not safe for the patient to be here for longer than necessary however we will continue with the fluid bolus until it is gone I will give him some insulin here to start his sugar trending down again and he does understand the need to follow-up closely with primary care doctor for chronically poor control to hyperglycemia as well as dehydration secondary to hyperglycemia.  Patient will do so tomorrow he states.  He does understand  he needs a recheck of BUN/creatinine after these fluids from his doctor.  ----------------------------------------- 8:16 PM on 10/17/2018 -----------------------------------------  Requesting discharge does not 1 to be here anymore, he has been hydrated as much as we can in the time that he wants to stay, sugars are up but he has chronically poorly controlled sugars and there is no evidence of DKA.  He understands he needs to come back if he feels worse, and he will continue taking his insulin tonight, for this reason I do not want to give him more here, and he will follow closely with PCP for recheck of BUN/creatinine    ____________________________________________   FINAL CLINICAL IMPRESSION(S) / ED DIAGNOSES  Final diagnoses:  None      This chart was dictated using voice recognition software.  Despite best efforts to proofread,  errors can occur which can change meaning.      Schuyler Amor, MD 10/17/18 1737    Schuyler Amor, MD 10/17/18 1830    Schuyler Amor, MD 10/17/18 2017

## 2018-10-17 NOTE — ED Triage Notes (Signed)
Pt presents to ED via [facility] from home c/o generalized weakness. Pt states CBG this morning read "high," 388 with EMS. Pt states he is type 2 DM, however has hx of DKA.

## 2018-10-17 NOTE — Discharge Instructions (Addendum)
Drink plenty of nonsugar containing fluids, please see your doctor tomorrow to have a recheck of your BUN/creatinine, as her kidney function is appearing somewhat worse today.  Modify your diet appropriately and use your insulin as prescribed.  Return if you feel worse in any way

## 2018-10-17 NOTE — ED Notes (Signed)
E-signature  Pad not working.

## 2018-10-17 NOTE — ED Notes (Signed)
Patient stood and walked in room with a steady gait. Patient states he feels stronger. Dr. Burlene Arnt aware.

## 2018-10-17 NOTE — ED Notes (Signed)
Patient requested something to drink and eat. Dr. Burlene Arnt aware.

## 2018-10-18 ENCOUNTER — Telehealth: Payer: Self-pay | Admitting: Family Medicine

## 2018-10-18 DIAGNOSIS — E1149 Type 2 diabetes mellitus with other diabetic neurological complication: Secondary | ICD-10-CM

## 2018-10-18 MED ORDER — TRESIBA FLEXTOUCH 100 UNIT/ML ~~LOC~~ SOPN: PEN_INJECTOR | SUBCUTANEOUS | 0 refills | Status: DC

## 2018-10-18 NOTE — Telephone Encounter (Signed)
Pt called saying his blood sugar has been running high a couple days.  He went to the ER yesterday because he fell.  He was in their about 8 hrs.   Blood glucose numbers running 506, 340 yesterday  CB#  [phone]  teri

## 2018-10-18 NOTE — Telephone Encounter (Signed)
Patient called back asking if he can take more insulin. He was advised not to until he received a call back from his provider. He states that he is not in any distress, just concerned with his elevated numbers.

## 2018-10-18 NOTE — Telephone Encounter (Signed)
Patient advised as below and verbally voiced understanding. Samples left up front for pick up. Dr. Caryn Section, do you have the log book to log out samples? I didn't see it in the sample closet/

## 2018-10-18 NOTE — Telephone Encounter (Signed)
He can have 4 boxes of tresiba, we've got plenty of samples.

## 2018-10-18 NOTE — Telephone Encounter (Signed)
Take insulin by sliding scale three times daily before meals.  Take 8 units if >150 10 units if >200 12 units if >250 14 units if >300 16 units if >350 and  18 units if >400  Take insulin N 20 bid.

## 2018-10-19 NOTE — Telephone Encounter (Signed)
Check with Jiles Garter, sometimes she has it to make sure everything is being documented correctly.

## 2018-11-16 ENCOUNTER — Other Ambulatory Visit: Payer: Self-pay | Admitting: Family Medicine

## 2018-11-16 NOTE — Telephone Encounter (Signed)
LMTCB 11/16/2018  Thanks,   -Mickel Baas

## 2018-11-16 NOTE — Telephone Encounter (Signed)
Please check with patient and see if he is out of [name]. If not he can have 4 sample pens. Also needs to schedule follow up sometime within the next month to check A1c.

## 2018-11-17 NOTE — Telephone Encounter (Signed)
Samples ready for pt

## 2018-11-17 NOTE — Telephone Encounter (Signed)
lmtcb

## 2018-11-17 NOTE — Telephone Encounter (Signed)
Patient advised. Patient says he has one more pen left and would like more samples if we have them available. I scheduled patient's follow up appointment for 12/19/2018 at 9:40am.  Please prepare samples for patient.

## 2018-12-19 ENCOUNTER — Other Ambulatory Visit: Payer: Self-pay

## 2018-12-19 ENCOUNTER — Ambulatory Visit (INDEPENDENT_AMBULATORY_CARE_PROVIDER_SITE_OTHER): Payer: Medicare HMO | Admitting: Family Medicine

## 2018-12-19 ENCOUNTER — Encounter: Payer: Self-pay | Admitting: Family Medicine

## 2018-12-19 VITALS — BP 110/70 | HR 89 | Temp 96.8°F | Resp 18 | Wt 162.2 lb

## 2018-12-19 DIAGNOSIS — Z23 Encounter for immunization: Secondary | ICD-10-CM | POA: Diagnosis not present

## 2018-12-19 DIAGNOSIS — I10 Essential (primary) hypertension: Secondary | ICD-10-CM

## 2018-12-19 DIAGNOSIS — E7800 Pure hypercholesterolemia, unspecified: Secondary | ICD-10-CM

## 2018-12-19 DIAGNOSIS — E1165 Type 2 diabetes mellitus with hyperglycemia: Secondary | ICD-10-CM | POA: Diagnosis not present

## 2018-12-19 DIAGNOSIS — E1149 Type 2 diabetes mellitus with other diabetic neurological complication: Secondary | ICD-10-CM

## 2018-12-19 LAB — POCT GLYCOSYLATED HEMOGLOBIN (HGB A1C): Hemoglobin A1C: 12.8 % — AB (ref 4.0–5.6)

## 2018-12-19 MED ORDER — PANTOPRAZOLE SODIUM 40 MG PO TBEC: 40.0000 mg | DELAYED_RELEASE_TABLET | Freq: Every day | ORAL | 0 refills | Status: DC

## 2018-12-19 MED ORDER — TRESIBA FLEXTOUCH 100 UNIT/ML ~~LOC~~ SOPN: PEN_INJECTOR | SUBCUTANEOUS | 0 refills | Status: DC

## 2018-12-19 MED ORDER — TRESIBA FLEXTOUCH 100 UNIT/ML ~~LOC~~ SOPN: PEN_INJECTOR | SUBCUTANEOUS | 12 refills | Status: DC

## 2018-12-19 MED ORDER — GABAPENTIN 300 MG PO CAPS: 300.0000 mg | ORAL_CAPSULE | Freq: Every day | ORAL | 3 refills | Status: DC

## 2018-12-19 NOTE — Progress Notes (Signed)
Patient: Nicholas Weiss Male    DOB: [birth year]   68 y.o.   MRN: [id number] Visit Date: 12/19/2018  Today's Provider: Lelon Huh, MD   Chief Complaint  Patient presents with  . Hypertension  . Hyperlipidemia  . Diabetes   Subjective:     HPI   Diabetes Mellitus Type II, Follow-up:   Lab Results  Component Value Date   HGBA1C 11.9 (A) 08/02/2018   HGBA1C 9.9 (A) 04/26/2018   HGBA1C 11.2 (A) 01/03/2018   Last seen for diabetes 4 months ago.  Management since then includes giving samples of Tresiba. He reports excellent compliance with treatment. He is having side effects.  Current symptoms include none and have been stable. Home blood sugar records: patient states that fasting 80-160  Episodes of hypoglycemia? no   Current Insulin Regimen: Tresiba 30 units a day to keep sugar <120, Novolin NPH BID and sliding scale Novolin R (patient reports today that he has only been taking 20units of [name] an frequently skips it if sugar is in the low 100s.  Most Recent Eye Exam: >1year Weight trend: stable Current diet: well balanced Current exercise: walking  ------------------------------------------------------------------------   Hypertension, follow-up:  BP Readings from Last 3 Encounters:  12/19/18 110/70  10/17/18 (!) 168/91  08/02/18 117/72    He was last seen for hypertension 4 months ago.  /BP at that visit was 117/72. Management since that visit includes none.He reports excellent compliance with treatment. He is not having side effects.  He is exercising. He is adherent to low salt diet.   Outside blood pressures are systolic 220-254 and diastolic 27-06C. He is experiencing lower extremity edema.  Patient denies chest pain, chest pressure/discomfort, claudication, dyspnea, exertional chest pressure/discomfort, fatigue, irregular heart beat, near-syncope, orthopnea, palpitations, paroxysmal nocturnal dyspnea, syncope and tachypnea.    Cardiovascular risk factors include advanced age (older than 7 for men, 72 for women), diabetes mellitus, hypertension and male gender.  Use of agents associated with hypertension: NSAIDS.   ------------------------------------------------------------------------    Lipid/Cholesterol, Follow-up:   Last seen for this 4 months ago.  Management since that visit includes none.  Last Lipid Panel: No results found for: CHOL, TRIG, HDL, CHOLHDL, VLDL, LDLCALC, LDLDIRECT  He reports excellent compliance with treatment. He is not having side effects.   Wt Readings from Last 3 Encounters:  12/19/18 162 lb 3.2 oz (73.6 kg)  10/17/18 160 lb (72.6 kg)  08/02/18 167 lb (75.8 kg)    ------------------------------------------------------------------------   Allergies  Allergen Reactions  . Penicillins Other (See Comments)    Syncope  Has patient had a PCN reaction causing immediate rash, facial/tongue/throat swelling, SOB or lightheadedness with hypotension: No Has patient had a PCN reaction causing severe rash involving mucus membranes or skin necrosis: No Has patient had a PCN reaction that required hospitalization No Has patient had a PCN reaction occurring within the last 10 years: No If all of the above answers are "NO", then may proceed with Cephalosporin use.      Current Outpatient Medications:  .  amitriptyline (ELAVIL) 10 MG tablet, Start one tablet at bedtime x 4 days, then increase to two at bedtime x 4 days, then 3 at bedtime if needed for tingling in feet (Patient taking differently: Take 30 mg by mouth at bedtime. Start one tablet at bedtime x 4 days, then increase to two at bedtime x 4 days, then 3 at bedtime if needed for tingling in feet), Disp: 60 tablet,  Rfl: 4 .  aspirin 81 MG chewable tablet, Chew 1 tablet by mouth daily., Disp: , Rfl:  .  famotidine (PEPCID) 20 MG tablet, Take 1 tablet (20 mg total) by mouth 2 (two) times daily., Disp: 60 tablet, Rfl: 0 .   gabapentin (NEURONTIN) 100 MG capsule, Take 3 capsules (300 mg total) by mouth at bedtime. PLEASE DISREGARD PREVIOUS PRESCRIPTION FOR THAT WAS FOR 30 TABLETS, Disp: 90 capsule, Rfl: 5 .  hydrochlorothiazide (HYDRODIURIL) 50 MG tablet, Take 1 tablet by mouth once daily, Disp: 90 tablet, Rfl: 3 .  insulin degludec (TRESIBA FLEXTOUCH) 100 UNIT/ML SOPN FlexTouch Pen, Start at 30 units once a day, increase as directed to keep fasting glucose <120, Disp: 12 mL, Rfl: 0 .  insulin regular (NOVOLIN R RELION) 100 units/mL injection, Inject 0.04-0.12 mLs (4-12 Units total) into the skin 3 (three) times daily before meals. According to sliding scale, Disp: 10 mL, Rfl: 11 .  lovastatin (MEVACOR) 40 MG tablet, Take 1 tablet (40 mg total) by mouth at bedtime., Disp: 90 tablet, Rfl: 4 .  pantoprazole (PROTONIX) 40 MG tablet, Take 1 tablet (40 mg total) by mouth daily., Disp: 30 tablet, Rfl: 0 .  diclofenac sodium (VOLTAREN) 1 % GEL, Apply 2 g topically 4 (four) times daily. (Patient not taking: Reported on 04/02/2018), Disp: 100 g, Rfl: 0  Review of Systems  Constitutional: Negative for appetite change, chills and fever.  Respiratory: Negative for chest tightness, shortness of breath and wheezing.   Cardiovascular: Negative for chest pain and palpitations.  Gastrointestinal: Negative for abdominal pain, nausea and vomiting.    Social History   Tobacco Use  . Smoking status: Never Smoker  . Smokeless tobacco: Never Used  Substance Use Topics  . Alcohol use: Yes    Alcohol/week: 1.0 standard drinks    Types: 1 Cans of beer per week    Comment: weekly      Objective:   BP 110/70   Pulse 89   Temp (!) 96.8 F (36 C) (Oral)   Resp 18   Wt 162 lb 3.2 oz (73.6 kg)   SpO2 98%   BMI 22.62 kg/m  Vitals:   12/19/18 1002  BP: 110/70  Pulse: 89  Resp: 18  Temp: (!) 96.8 F (36 C)  TempSrc: Oral  SpO2: 98%  Weight: 162 lb 3.2 oz (73.6 kg)  Body mass index is 22.62 kg/m.   Physical Exam    General Appearance:    Alert, cooperative, no distress  Eyes:    PERRL, conjunctiva/corneas clear, EOM's intact       Lungs:     Clear to auscultation bilaterally, respirations unlabored  Heart:    Normal heart rate. Normal rhythm. No murmurs, rubs, or gallops.   MS:   All extremities are intact.   Neurologic:   Awake, alert, oriented x 3. No apparent focal neurological           defect.        Results for orders placed or performed in visit on 12/19/18  POCT glycosylated hemoglobin (Hb A1C)  Result Value Ref Range   Hemoglobin A1C 12.8 (A) 4.0 - 5.6 %   HbA1c POC (<> result, manual entry)     HbA1c, POC (prediabetic range)     HbA1c, POC (controlled diabetic range)         Assessment & Plan    1. Uncontrolled type 2 diabetes mellitus with hyperglycemia ([place]) He was instructed to take tresiba consistently every day. If  he starts seeing fasting sugars consistently under 80 we will reduce dose of tresiba.  - Comprehensive metabolic panel - Lipid panel  - insulin degludec (TRESIBA FLEXTOUCH) 100 UNIT/ML SOPN FlexTouch Pen; Start at 30 units once a day, increase as directed to keep fasting glucose <120  Dispense: 5 pen; Refill: 12 2. Need for influenza vaccination  - Flu Vaccine QUAD High Dose(Fluad)  3. Other diabetic neurological complication associated with type 2 diabetes mellitus (HCC) restarte- gabapentin (NEURONTIN) 300 MG capsule; Take 1 capsule (300 mg total) by mouth at bedtime. PLEASE DISREGARD PREVIOUS PRESCRIPTION FOR THAT WAS FOR 30 TABLETS  Dispense: 90 capsule; Refill: 3  4. Hypercholesterolemia He is tolerating lovastatin well with no adverse effects.    5. Essential (primary) hypertension Well controlled.  Continue current medications.    Refill pantoprazole (PROTONIX) 40 MG tablet; Take 1 tablet (40 mg total) by mouth daily. For acid reflux  Dispense: 30 tablet; Refill: 0     Lelon Huh, MD  [facility]

## 2018-12-19 NOTE — Patient Instructions (Addendum)
.   Please review the attached list of medications and notify my office if there are any errors.   . Please bring all of your medications to every appointment so we can make sure that our medication list is the same as yours.   . It is especially important to get the annual flu vaccine this year. If you haven't had it already, please go to your pharmacy or call the office as soon as possible to schedule you flu shot.   Start taking vitamin B12 1000mg  every day

## 2018-12-20 ENCOUNTER — Telehealth: Payer: Self-pay

## 2018-12-20 LAB — LIPID PANEL
Chol/HDL Ratio: 3 ratio (ref 0.0–5.0)
Cholesterol, Total: 138 mg/dL (ref 100–199)
HDL: 46 mg/dL (ref >39)
LDL Chol Calc (NIH): 78 mg/dL (ref 0–99)
Triglycerides: 70 mg/dL (ref 0–149)
VLDL Cholesterol Cal: 14 mg/dL (ref 5–40)

## 2018-12-20 LAB — COMPREHENSIVE METABOLIC PANEL
ALT: 16 IU/L (ref 0–44)
AST: 12 IU/L (ref 0–40)
Albumin/Globulin Ratio: 0.9 — ABNORMAL LOW (ref 1.2–2.2)
Albumin: 3.8 g/dL (ref 3.8–4.8)
Alkaline Phosphatase: 72 IU/L (ref 39–117)
BUN/Creatinine Ratio: 13 (ref 10–24)
BUN: 21 mg/dL (ref 8–27)
Bilirubin Total: <0.2 mg/dL (ref 0.0–1.2)
CO2: 25 mmol/L (ref 20–29)
Calcium: 9.1 mg/dL (ref 8.6–10.2)
Chloride: 101 mmol/L (ref 96–106)
Creatinine, Ser: 1.66 mg/dL — ABNORMAL HIGH (ref 0.76–1.27)
GFR calc Af Amer: 49 mL/min/{1.73_m2} — ABNORMAL LOW (ref >59)
GFR calc non Af Amer: 43 mL/min/{1.73_m2} — ABNORMAL LOW (ref >59)
Globulin, Total: 4.4 g/dL (ref 1.5–4.5)
Glucose: 148 mg/dL — ABNORMAL HIGH (ref 65–99)
Potassium: 4.3 mmol/L (ref 3.5–5.2)
Sodium: 139 mmol/L (ref 134–144)
Total Protein: 8.2 g/dL (ref 6.0–8.5)

## 2018-12-20 NOTE — Telephone Encounter (Signed)
-----   Message from Birdie Sons, MD sent at 12/20/2018  8:29 AM EDT ----- Kidney functions are at about 60%, which is better then when last checked. Cholesterol is good. Continue current dose of insulin and medications. Schedule follow up for diabetes in 3 months.

## 2018-12-20 NOTE — Telephone Encounter (Signed)
Attempted to contact patient on both contact numbers in the chart, no answer or voicemail.

## 2018-12-26 NOTE — Telephone Encounter (Signed)
Tried calling patient at both numbers listed in chart. No answer or voice message system.

## 2018-12-27 ENCOUNTER — Telehealth: Payer: Self-pay | Admitting: Family Medicine

## 2018-12-27 NOTE — Telephone Encounter (Addendum)
[id number] 

## 2018-12-27 NOTE — Telephone Encounter (Signed)
Pt returnerd call  teri

## 2018-12-30 NOTE — Telephone Encounter (Signed)
Unable to contact the patient. Letter mailed.  

## 2019-02-27 DIAGNOSIS — Z20828 Contact with and (suspected) exposure to other viral communicable diseases: Secondary | ICD-10-CM | POA: Diagnosis not present

## 2019-04-04 ENCOUNTER — Other Ambulatory Visit
Admission: RE | Admit: 2019-04-04 | Discharge: 2019-04-04 | Disposition: A | Discharge status: Home / Self Care | Payer: Medicare HMO | Source: Ambulatory Visit | Attending: Family Medicine | Admitting: Family Medicine

## 2019-04-04 ENCOUNTER — Telehealth: Payer: Self-pay

## 2019-04-04 ENCOUNTER — Ambulatory Visit (INDEPENDENT_AMBULATORY_CARE_PROVIDER_SITE_OTHER): Payer: Medicare HMO | Admitting: Family Medicine

## 2019-04-04 ENCOUNTER — Other Ambulatory Visit: Payer: Self-pay

## 2019-04-04 ENCOUNTER — Encounter: Payer: Self-pay | Admitting: Family Medicine

## 2019-04-04 VITALS — BP 158/80 | HR 92 | Temp 96.9°F | Resp 18 | Wt 163.0 lb

## 2019-04-04 DIAGNOSIS — E1165 Type 2 diabetes mellitus with hyperglycemia: Secondary | ICD-10-CM

## 2019-04-04 DIAGNOSIS — E1110 Type 2 diabetes mellitus with ketoacidosis without coma: Secondary | ICD-10-CM | POA: Diagnosis not present

## 2019-04-04 DIAGNOSIS — R531 Weakness: Secondary | ICD-10-CM

## 2019-04-04 DIAGNOSIS — E1010 Type 1 diabetes mellitus with ketoacidosis without coma: Secondary | ICD-10-CM | POA: Diagnosis not present

## 2019-04-04 LAB — COMPREHENSIVE METABOLIC PANEL
ALT: 11 U/L (ref 0–44)
AST: 15 U/L (ref 15–41)
Albumin: 3.7 g/dL (ref 3.5–5.0)
Alkaline Phosphatase: 65 U/L (ref 38–126)
Anion gap: 12 (ref 5–15)
BUN: 41 mg/dL — ABNORMAL HIGH (ref 8–23)
CO2: 23 mmol/L (ref 22–32)
Calcium: 9.1 mg/dL (ref 8.9–10.3)
Chloride: 100 mmol/L (ref 98–111)
Creatinine, Ser: 2.13 mg/dL — ABNORMAL HIGH (ref 0.61–1.24)
GFR calc Af Amer: 36 mL/min — ABNORMAL LOW (ref >60)
GFR calc non Af Amer: 31 mL/min — ABNORMAL LOW (ref >60)
Glucose, Bld: 313 mg/dL — ABNORMAL HIGH (ref 70–99)
Potassium: 3.6 mmol/L (ref 3.5–5.1)
Sodium: 135 mmol/L (ref 135–145)
Total Bilirubin: 0.6 mg/dL (ref 0.3–1.2)
Total Protein: 8.9 g/dL — ABNORMAL HIGH (ref 6.5–8.1)

## 2019-04-04 LAB — POCT GLYCOSYLATED HEMOGLOBIN (HGB A1C)
Est. average glucose Bld gHb Est-mCnc: 289
Hemoglobin A1C: 11.7 % — AB (ref 4.0–5.6)

## 2019-04-04 LAB — CBC
HCT: 33.2 % — ABNORMAL LOW (ref 39.0–52.0)
Hemoglobin: 10.9 g/dL — ABNORMAL LOW (ref 13.0–17.0)
MCH: 29.9 pg (ref 26.0–34.0)
MCHC: 32.8 g/dL (ref 30.0–36.0)
MCV: 91 fL (ref 80.0–100.0)
Platelets: 213 10*3/uL (ref 150–400)
RBC: 3.65 MIL/uL — ABNORMAL LOW (ref 4.22–5.81)
RDW: 12.4 % (ref 11.5–15.5)
WBC: 4.5 10*3/uL (ref 4.0–10.5)
nRBC: 0 % (ref 0.0–0.2)

## 2019-04-04 LAB — GLUCOSE, POCT (MANUAL RESULT ENTRY)
POC Glucose: 433 mg/dl — AB (ref 70–99)
POC Glucose: 391 mg/dl — AB (ref 70–99)
POC Glucose: 411 mg/dl — AB (ref 70–99)

## 2019-04-04 NOTE — Patient Instructions (Addendum)
.   Please review the attached list of medications and notify my office if there are any errors.   . Please go to the lab draw station through the [place] entrance at [facility]

## 2019-04-04 NOTE — Progress Notes (Signed)
Patient is very dehydrated and kidney functions are much worse. He needs to drink 6-8 glasses or water a day. Needs to increase Tresiba to 40 units EVERY day regardless of blood sugar  Needs to take 12 units of insulin R or Humalog three times a day before meals. Only skip insulin R if blood sugar is below 150 before eating.   Need to recheck renal panel on Thursday to make sure kidney functions are improving.

## 2019-04-04 NOTE — Telephone Encounter (Signed)
Patient advised and verbally voiced understanding. Patient agrees to treatment plan. Should the order for renal panel be placed as a STAT order for Thursday?

## 2019-04-04 NOTE — Progress Notes (Signed)
Patient: Nicholas Weiss Male    DOB: 09-30-1950   69 y.o.   MRN: [id number] Visit Date: 04/04/2019  Today's Provider: Lelon Huh, MD   Chief Complaint  Patient presents with  . Diabetes   Subjective:     HPI  Diabetes Mellitus Type II, Follow-up:  Lab Results  Component Value Date   HGBA1C 11.7 (A) 04/04/2019    Last seen for diabetes 3 months ago.  Management since then includes instructed to take tresiba consistently every day. If he starts seeing fasting sugars consistently under 80 we will reduce dose of tresiba. He reports excellent compliance with treatment. He is having side effects.  Current symptoms include none  Home blood sugar records: bloos glucose meter reading "HI"  Episodes of hypoglycemia? no              Current Insulin Regimen: Tresiba 30 units a day to keep sugar <120, Novolin NPH BID and sliding scale Novolin R  Most Recent Eye Exam: >1year 04/23/2015 Weight trend: stable Current diet: well balanced Current exercise: walking  He states that his skipped [name] yesterday when he took Insulin R instead due to very high blood sugar readints.  ------------------------------------------------------------------------   Hypertension, follow-up:     BP Readings from Last 3 Encounters:  12/19/18 110/70  10/17/18 (!) 168/91  08/02/18 117/72    He was last seen for hypertension 3 months ago.  /BP at that visit was 110/70. Management since that visit includes no change. He reports excellent compliance with treatment. He is not having side effects.  He is exercising. He is adherent to low salt diet.   Outside blood pressures  He is experiencing lower extremity edema.  Patient denies chest pain, chest pressure/discomfort, claudication, dyspnea, exertional chest pressure/discomfort, fatigue, irregular heart beat, near-syncope, orthopnea, palpitations, paroxysmal nocturnal dyspnea, syncope and tachypnea.   Cardiovascular risk factors  include advanced age (older than 24 for men, 37 for women), diabetes mellitus, hypertension and male gender.  Use of agents associated with hypertension: NSAIDS.   ------------------------------------------------------------------------    Lipid/Cholesterol, Follow-up:   Last seen for this 3 months ago.  Management since that visit includes none.  Last Lipid Panel: Lab Results  Component Value Date   CHOL 138 12/19/2018   HDL 46 12/19/2018   LDLCALC 78 12/19/2018   TRIG 70 12/19/2018   CHOLHDL 3.0 12/19/2018    He reports excellent compliance with treatment. He is not having side effects.      Wt Readings from Last 3 Encounters:  12/19/18 162 lb 3.2 oz (73.6 kg)  10/17/18 160 lb (72.6 kg)  08/02/18 167 lb (75.8 kg)    ------------------------------------------------------------------------   Allergies  Allergen Reactions  . Penicillins Other (See Comments)    Syncope  Has patient had a PCN reaction causing immediate rash, facial/tongue/throat swelling, SOB or lightheadedness with hypotension: No Has patient had a PCN reaction causing severe rash involving mucus membranes or skin necrosis: No Has patient had a PCN reaction that required hospitalization No Has patient had a PCN reaction occurring within the last 10 years: No If all of the above answers are "NO", then may proceed with Cephalosporin use.      Current Outpatient Medications:  .  aspirin 81 MG chewable tablet, Chew 1 tablet by mouth daily., Disp: , Rfl:  .  diclofenac sodium (VOLTAREN) 1 % GEL, Apply 2 g topically 4 (four) times daily., Disp: 100 g, Rfl: 0 .  gabapentin (NEURONTIN) 300  MG capsule, Take 1 capsule (300 mg total) by mouth at bedtime. PLEASE DISREGARD PREVIOUS PRESCRIPTION FOR THAT WAS FOR 30 TABLETS, Disp: 90 capsule, Rfl: 3 .  hydrochlorothiazide (HYDRODIURIL) 50 MG tablet, Take 1 tablet by mouth once daily, Disp: 90 tablet, Rfl: 3 .  insulin degludec (TRESIBA FLEXTOUCH) 100  UNIT/ML SOPN FlexTouch Pen, Start at 30 units once a day, increase as directed to keep fasting glucose <120, Disp: 5 pen, Rfl: 12 .  insulin regular (NOVOLIN R RELION) 100 units/mL injection, Inject 0.04-0.12 mLs (4-12 Units total) into the skin 3 (three) times daily before meals. According to sliding scale, Disp: 10 mL, Rfl: 11 .  lovastatin (MEVACOR) 40 MG tablet, Take 1 tablet (40 mg total) by mouth at bedtime., Disp: 90 tablet, Rfl: 4 .  pantoprazole (PROTONIX) 40 MG tablet, Take 1 tablet (40 mg total) by mouth daily. For acid reflux, Disp: 30 tablet, Rfl: 0 .  famotidine (PEPCID) 20 MG tablet, Take 1 tablet (20 mg total) by mouth 2 (two) times daily., Disp: 60 tablet, Rfl: 0  Review of Systems  Constitutional: Positive for fatigue. Negative for appetite change, chills and fever.  Respiratory: Negative.  Negative for chest tightness, shortness of breath and wheezing.   Cardiovascular: Negative.  Negative for chest pain and palpitations.  Gastrointestinal: Negative for abdominal pain, nausea and vomiting.  Endocrine: Negative.   Musculoskeletal: Negative.   Neurological: Positive for dizziness.    Social History   Tobacco Use  . Smoking status: Never Smoker  . Smokeless tobacco: Never Used  Substance Use Topics  . Alcohol use: Yes    Alcohol/week: 1.0 standard drinks    Types: 1 Cans of beer per week    Comment: weekly      Objective:   BP 110/70 (BP Location: Left Arm, Patient Position: Sitting, Cuff Size: Normal)   Pulse 92   Temp (!) 96.9 F (36.1 C) (Temporal)   Resp 18   Wt 163 lb (73.9 kg)   SpO2 96% Comment: room air  BMI 22.73 kg/m  Vitals:   04/04/19 1014  BP: 110/70  Pulse: 92  Resp: 18  Temp: (!) 96.9 F (36.1 C)  TempSrc: Temporal  SpO2: 96%  Weight: 163 lb (73.9 kg)  Body mass index is 22.73 kg/m.   Physical Exam   General Appearance:    Well developed, well nourished male. Initially very weak and lethargic.   Eyes:    PERRL,  conjunctiva/corneas clear, EOM's intact       Lungs:     Clear to auscultation bilaterally, respirations unlabored  Heart:    Normal heart rate. Normal rhythm. No murmurs, rubs, or gallops.   MS:   All extremities are intact.   Neurologic:   Awake, alert, oriented x 3. No apparent focal neurological           defect.        Results for orders placed or performed in visit on 04/04/19  POCT HgB A1C  Result Value Ref Range   Hemoglobin A1C 11.7 (A) 4.0 - 5.6 %   Est. average glucose Bld gHb Est-mCnc 289   POCT Glucose (CBG)  Result Value Ref Range   POC Glucose 433 (A) 70 - 99 mg/dl  POCT Glucose (CBG)  Result Value Ref Range   POC Glucose 411 (A) 70 - 99 mg/dl  POCT Glucose (CBG)  Result Value Ref Range   POC Glucose 391 (A) 70 - 99 mg/dl  Assessment & Plan    1. Uncontrolled type 2 diabetes mellitus with hyperglycemia ([place])   2. Weakness Patient initially given 10 units Humalog. FS glucose come down from 433 to 391 after 40 minutes. He was given additional 10 units after 391 FS glucose reading. He was given large glass of water to drink through today's. But the time he was released his weakness and dizziness had greatly improved. He was sent directly to outpatient lab at [facility]. Will advise on medication and insulin dose adjustments after reviewing labs.  - Comp Met (CMET) - CBC     Lelon Huh, MD  [facility]

## 2019-04-04 NOTE — Telephone Encounter (Signed)
-----   Message from Birdie Sons, MD sent at 04/04/2019 12:35 PM EST ----- Patient is very dehydrated and kidney functions are much worse. He needs to drink 6-8 glasses or water a day. Needs to increase Tresiba to 40 units EVERY day regardless of blood sugar  Needs to take 12 units of insulin R or Humalog three times a day before meals. Only skip insulin R if blood sugar is below 150 before eating.   Need to recheck renal panel on Thursday to make sure kidney functions are improving.

## 2019-04-05 ENCOUNTER — Encounter: Payer: Self-pay | Admitting: Emergency Medicine

## 2019-04-05 ENCOUNTER — Inpatient Hospital Stay
Admission: EM | Admit: 2019-04-05 | Discharge: 2019-04-07 | DRG: 638 | Disposition: A | Discharge status: Home / Self Care | Payer: Medicare HMO | Attending: Internal Medicine | Admitting: Internal Medicine

## 2019-04-05 ENCOUNTER — Other Ambulatory Visit: Payer: Self-pay

## 2019-04-05 ENCOUNTER — Inpatient Hospital Stay: Payer: Medicare HMO

## 2019-04-05 DIAGNOSIS — I10 Essential (primary) hypertension: Secondary | ICD-10-CM | POA: Diagnosis not present

## 2019-04-05 DIAGNOSIS — N179 Acute kidney failure, unspecified: Secondary | ICD-10-CM | POA: Diagnosis present

## 2019-04-05 DIAGNOSIS — R531 Weakness: Secondary | ICD-10-CM | POA: Diagnosis not present

## 2019-04-05 DIAGNOSIS — N1831 Chronic kidney disease, stage 3a: Secondary | ICD-10-CM

## 2019-04-05 DIAGNOSIS — E109 Type 1 diabetes mellitus without complications: Secondary | ICD-10-CM

## 2019-04-05 DIAGNOSIS — R0602 Shortness of breath: Secondary | ICD-10-CM | POA: Diagnosis not present

## 2019-04-05 DIAGNOSIS — Z20822 Contact with and (suspected) exposure to covid-19: Secondary | ICD-10-CM | POA: Diagnosis present

## 2019-04-05 DIAGNOSIS — E860 Dehydration: Secondary | ICD-10-CM | POA: Diagnosis present

## 2019-04-05 DIAGNOSIS — N183 Chronic kidney disease, stage 3 unspecified: Secondary | ICD-10-CM | POA: Diagnosis present

## 2019-04-05 DIAGNOSIS — E1165 Type 2 diabetes mellitus with hyperglycemia: Secondary | ICD-10-CM

## 2019-04-05 DIAGNOSIS — IMO0002 Reserved for concepts with insufficient information to code with codable children: Secondary | ICD-10-CM

## 2019-04-05 DIAGNOSIS — E1010 Type 1 diabetes mellitus with ketoacidosis without coma: Principal | ICD-10-CM | POA: Diagnosis present

## 2019-04-05 DIAGNOSIS — Z794 Long term (current) use of insulin: Secondary | ICD-10-CM | POA: Diagnosis not present

## 2019-04-05 DIAGNOSIS — J984 Other disorders of lung: Secondary | ICD-10-CM | POA: Diagnosis not present

## 2019-04-05 DIAGNOSIS — I160 Hypertensive urgency: Secondary | ICD-10-CM | POA: Diagnosis not present

## 2019-04-05 DIAGNOSIS — Z79899 Other long term (current) drug therapy: Secondary | ICD-10-CM | POA: Diagnosis not present

## 2019-04-05 DIAGNOSIS — I129 Hypertensive chronic kidney disease with stage 1 through stage 4 chronic kidney disease, or unspecified chronic kidney disease: Secondary | ICD-10-CM | POA: Diagnosis present

## 2019-04-05 DIAGNOSIS — R404 Transient alteration of awareness: Secondary | ICD-10-CM | POA: Diagnosis not present

## 2019-04-05 DIAGNOSIS — R112 Nausea with vomiting, unspecified: Secondary | ICD-10-CM | POA: Diagnosis not present

## 2019-04-05 DIAGNOSIS — E1310 Other specified diabetes mellitus with ketoacidosis without coma: Secondary | ICD-10-CM | POA: Diagnosis not present

## 2019-04-05 DIAGNOSIS — E1022 Type 1 diabetes mellitus with diabetic chronic kidney disease: Secondary | ICD-10-CM | POA: Diagnosis present

## 2019-04-05 DIAGNOSIS — I440 Atrioventricular block, first degree: Secondary | ICD-10-CM | POA: Diagnosis not present

## 2019-04-05 DIAGNOSIS — R069 Unspecified abnormalities of breathing: Secondary | ICD-10-CM | POA: Diagnosis not present

## 2019-04-05 DIAGNOSIS — Z8249 Family history of ischemic heart disease and other diseases of the circulatory system: Secondary | ICD-10-CM

## 2019-04-05 DIAGNOSIS — Z880 Allergy status to penicillin: Secondary | ICD-10-CM | POA: Diagnosis not present

## 2019-04-05 DIAGNOSIS — R911 Solitary pulmonary nodule: Secondary | ICD-10-CM | POA: Diagnosis present

## 2019-04-05 DIAGNOSIS — E785 Hyperlipidemia, unspecified: Secondary | ICD-10-CM | POA: Diagnosis present

## 2019-04-05 DIAGNOSIS — R110 Nausea: Secondary | ICD-10-CM | POA: Diagnosis present

## 2019-04-05 DIAGNOSIS — Z7982 Long term (current) use of aspirin: Secondary | ICD-10-CM | POA: Diagnosis not present

## 2019-04-05 DIAGNOSIS — E1110 Type 2 diabetes mellitus with ketoacidosis without coma: Secondary | ICD-10-CM | POA: Diagnosis not present

## 2019-04-05 DIAGNOSIS — E7800 Pure hypercholesterolemia, unspecified: Secondary | ICD-10-CM | POA: Diagnosis present

## 2019-04-05 DIAGNOSIS — E1065 Type 1 diabetes mellitus with hyperglycemia: Secondary | ICD-10-CM

## 2019-04-05 DIAGNOSIS — Z9114 Patient's other noncompliance with medication regimen: Secondary | ICD-10-CM

## 2019-04-05 DIAGNOSIS — N170 Acute kidney failure with tubular necrosis: Secondary | ICD-10-CM | POA: Diagnosis not present

## 2019-04-05 DIAGNOSIS — N6459 Other signs and symptoms in breast: Secondary | ICD-10-CM | POA: Diagnosis not present

## 2019-04-05 DIAGNOSIS — R000 Tachycardia, unspecified: Secondary | ICD-10-CM | POA: Diagnosis not present

## 2019-04-05 LAB — BASIC METABOLIC PANEL
Anion gap: 20 — ABNORMAL HIGH (ref 5–15)
Anion gap: 20 — ABNORMAL HIGH (ref 5–15)
BUN: 47 mg/dL — ABNORMAL HIGH (ref 8–23)
BUN: 44 mg/dL — ABNORMAL HIGH (ref 8–23)
CO2: 14 mmol/L — ABNORMAL LOW (ref 22–32)
CO2: 13 mmol/L — ABNORMAL LOW (ref 22–32)
Calcium: 8.4 mg/dL — ABNORMAL LOW (ref 8.9–10.3)
Calcium: 8.2 mg/dL — ABNORMAL LOW (ref 8.9–10.3)
Chloride: 100 mmol/L (ref 98–111)
Chloride: 103 mmol/L (ref 98–111)
Creatinine, Ser: 2.4 mg/dL — ABNORMAL HIGH (ref 0.61–1.24)
Creatinine, Ser: 2.35 mg/dL — ABNORMAL HIGH (ref 0.61–1.24)
GFR calc Af Amer: 31 mL/min — ABNORMAL LOW (ref >60)
GFR calc Af Amer: 32 mL/min — ABNORMAL LOW (ref >60)
GFR calc non Af Amer: 27 mL/min — ABNORMAL LOW (ref >60)
GFR calc non Af Amer: 28 mL/min — ABNORMAL LOW (ref >60)
Glucose, Bld: 714 mg/dL (ref 70–99)
Glucose, Bld: 657 mg/dL (ref 70–99)
Potassium: 5.6 mmol/L — ABNORMAL HIGH (ref 3.5–5.1)
Potassium: 5.3 mmol/L — ABNORMAL HIGH (ref 3.5–5.1)
Sodium: 134 mmol/L — ABNORMAL LOW (ref 135–145)
Sodium: 136 mmol/L (ref 135–145)

## 2019-04-05 LAB — URINALYSIS, COMPLETE (UACMP) WITH MICROSCOPIC
Bacteria, UA: NONE SEEN
Bilirubin Urine: NEGATIVE
Glucose, UA: >=500 mg/dL — AB
Hgb urine dipstick: NEGATIVE
Ketones, ur: 20 mg/dL — AB
Leukocytes,Ua: NEGATIVE
Nitrite: NEGATIVE
Protein, ur: NEGATIVE mg/dL
Specific Gravity, Urine: 1.021 (ref 1.005–1.030)
pH: 5 (ref 5.0–8.0)

## 2019-04-05 LAB — COMPREHENSIVE METABOLIC PANEL
ALT: 14 U/L (ref 0–44)
AST: 22 U/L (ref 15–41)
Albumin: 3.8 g/dL (ref 3.5–5.0)
Alkaline Phosphatase: 68 U/L (ref 38–126)
Anion gap: 25 — ABNORMAL HIGH (ref 5–15)
BUN: 47 mg/dL — ABNORMAL HIGH (ref 8–23)
CO2: 13 mmol/L — ABNORMAL LOW (ref 22–32)
Calcium: 9 mg/dL (ref 8.9–10.3)
Chloride: 94 mmol/L — ABNORMAL LOW (ref 98–111)
Creatinine, Ser: 2.6 mg/dL — ABNORMAL HIGH (ref 0.61–1.24)
GFR calc Af Amer: 29 mL/min — ABNORMAL LOW (ref >60)
GFR calc non Af Amer: 25 mL/min — ABNORMAL LOW (ref >60)
Glucose, Bld: 781 mg/dL (ref 70–99)
Potassium: 5.2 mmol/L — ABNORMAL HIGH (ref 3.5–5.1)
Sodium: 132 mmol/L — ABNORMAL LOW (ref 135–145)
Total Bilirubin: 1.4 mg/dL — ABNORMAL HIGH (ref 0.3–1.2)
Total Protein: 9.1 g/dL — ABNORMAL HIGH (ref 6.5–8.1)

## 2019-04-05 LAB — BLOOD GAS, VENOUS
Acid-base deficit: 12.8 mmol/L — ABNORMAL HIGH (ref 0.0–2.0)
Bicarbonate: 13.3 mmol/L — ABNORMAL LOW (ref 20.0–28.0)
FIO2: 0.21
O2 Saturation: 48.8 %
Patient temperature: 37
pCO2, Ven: 31 mmHg — ABNORMAL LOW (ref 44.0–60.0)
pH, Ven: 7.24 — ABNORMAL LOW (ref 7.250–7.430)
pO2, Ven: 32 mmHg (ref 32.0–45.0)

## 2019-04-05 LAB — CBC
HCT: 34.4 % — ABNORMAL LOW (ref 39.0–52.0)
Hemoglobin: 11.1 g/dL — ABNORMAL LOW (ref 13.0–17.0)
MCH: 29.9 pg (ref 26.0–34.0)
MCHC: 32.3 g/dL (ref 30.0–36.0)
MCV: 92.7 fL (ref 80.0–100.0)
Platelets: 223 10*3/uL (ref 150–400)
RBC: 3.71 MIL/uL — ABNORMAL LOW (ref 4.22–5.81)
RDW: 12.6 % (ref 11.5–15.5)
WBC: 6.7 10*3/uL (ref 4.0–10.5)
nRBC: 0 % (ref 0.0–0.2)

## 2019-04-05 LAB — GLUCOSE, CAPILLARY
Glucose-Capillary: 440 mg/dL — ABNORMAL HIGH (ref 70–99)
Glucose-Capillary: 459 mg/dL — ABNORMAL HIGH (ref 70–99)
Glucose-Capillary: 507 mg/dL (ref 70–99)
Glucose-Capillary: 557 mg/dL (ref 70–99)
Glucose-Capillary: 575 mg/dL (ref 70–99)
Glucose-Capillary: >600 mg/dL (ref 70–99)
Glucose-Capillary: >600 mg/dL (ref 70–99)
Glucose-Capillary: >600 mg/dL (ref 70–99)

## 2019-04-05 LAB — BETA-HYDROXYBUTYRIC ACID
Beta-Hydroxybutyric Acid: 6.55 mmol/L — ABNORMAL HIGH (ref 0.05–0.27)
Beta-Hydroxybutyric Acid: 6.6 mmol/L — ABNORMAL HIGH (ref 0.05–0.27)

## 2019-04-05 LAB — HEMOGLOBIN A1C
Hgb A1c MFr Bld: 12 % — ABNORMAL HIGH (ref 4.8–5.6)
Mean Plasma Glucose: 297.7 mg/dL

## 2019-04-05 LAB — POC SARS CORONAVIRUS 2 AG: SARS Coronavirus 2 Ag: NEGATIVE

## 2019-04-05 LAB — TROPONIN I (HIGH SENSITIVITY)
Troponin I (High Sensitivity): 16 ng/L (ref <18)
Troponin I (High Sensitivity): 19 ng/L — ABNORMAL HIGH (ref <18)

## 2019-04-05 LAB — LIPASE, BLOOD: Lipase: 19 U/L (ref 11–51)

## 2019-04-05 LAB — HIV ANTIBODY (ROUTINE TESTING W REFLEX): HIV Screen 4th Generation wRfx: NONREACTIVE

## 2019-04-05 IMAGING — DX DG CHEST 1V PORT
1 series · 2 of 2 positions shown · non-contrast
Comparison: [DATE]

CLINICAL DATA: DKA

EXAM:
PORTABLE CHEST 1 VIEW

[Series 1: chest ap · 0.14mm/px · 2 of 2 slices shown]
[im 1/2]
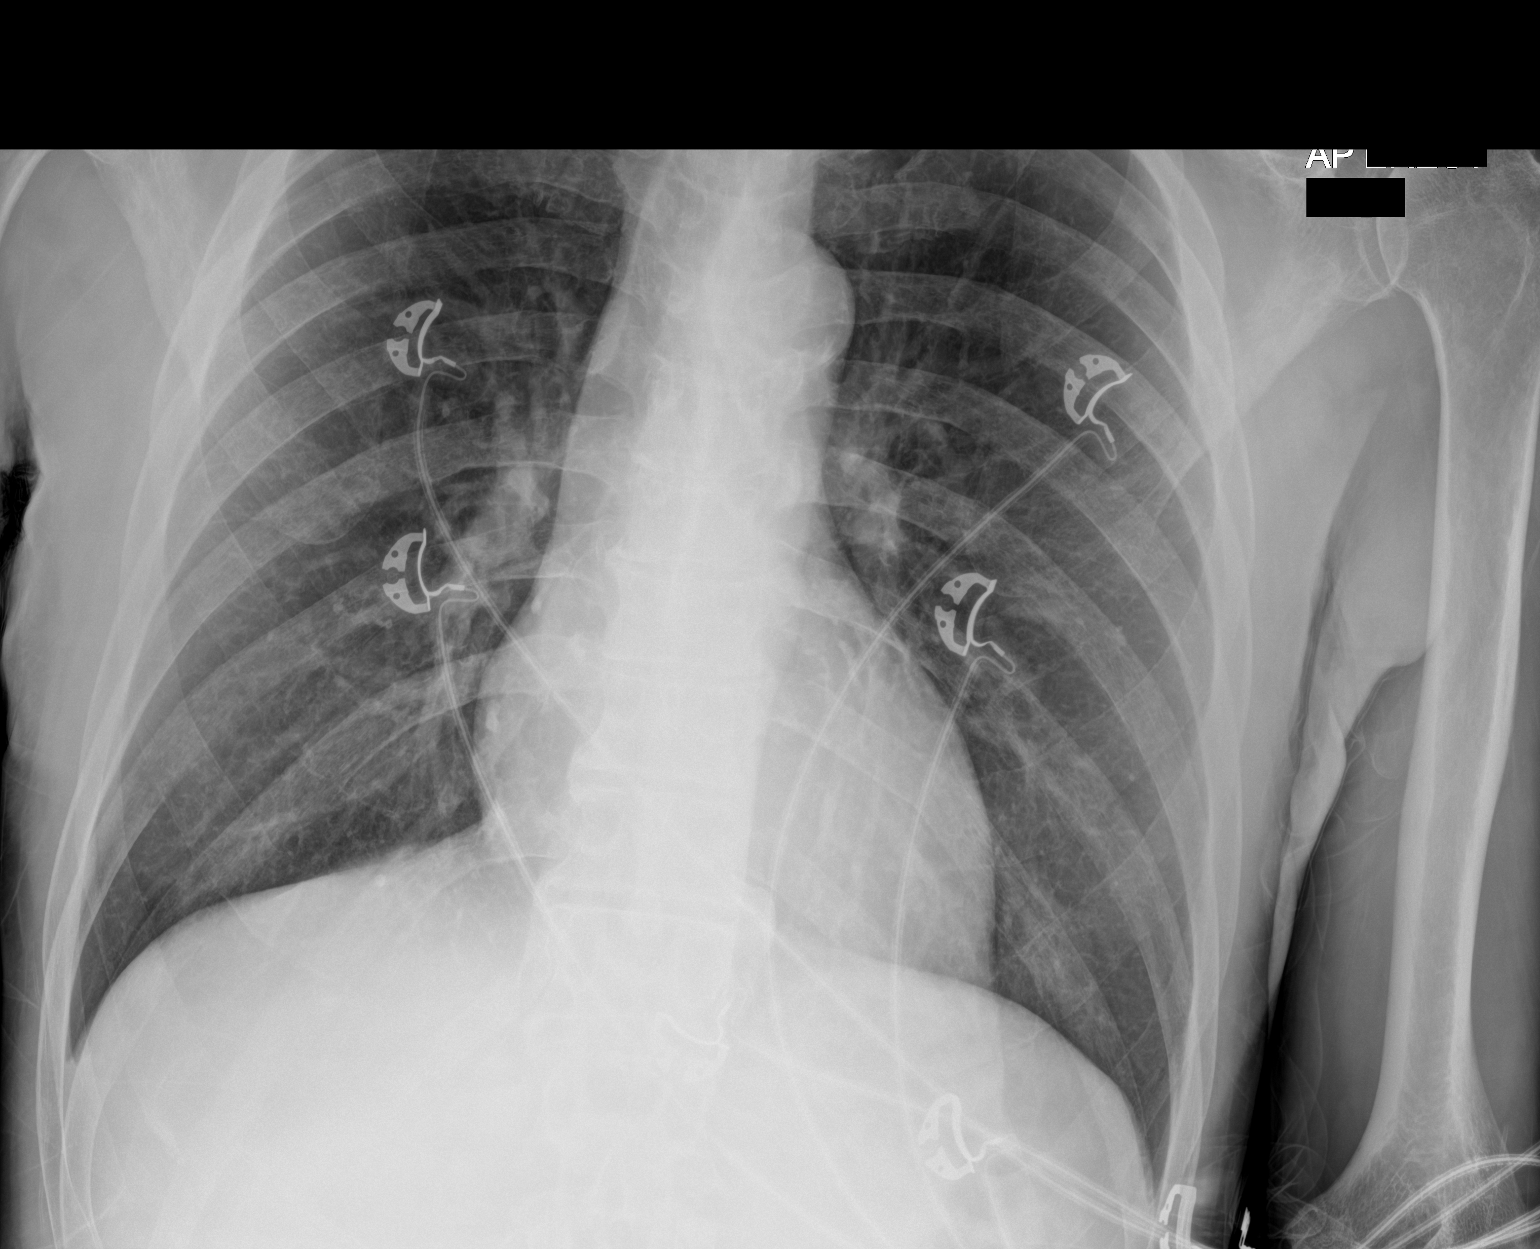
[im 2/2]
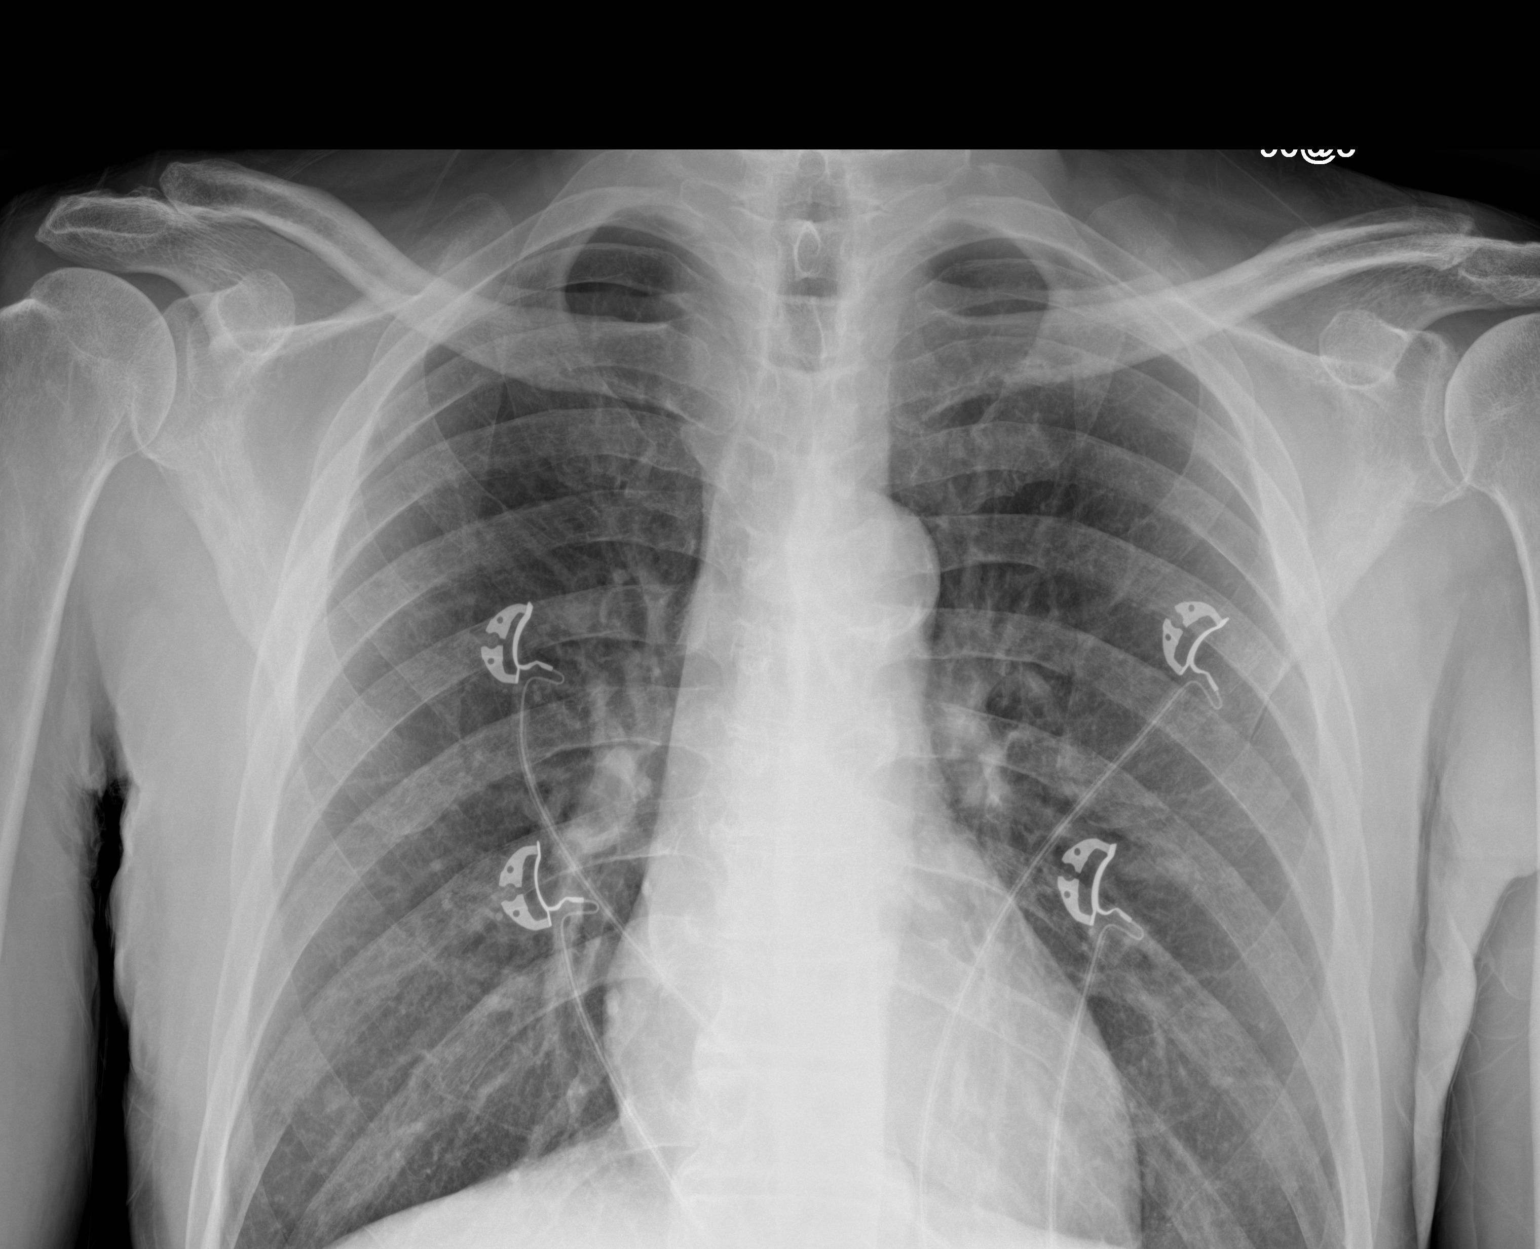

[2 of 2 positions shown; findings below may reference images not displayed]

FINDINGS: The heart size is normal. There is no pneumothorax or large pleural
effusion. There is no focal infiltrate. There is a rounded 1.1 cm
density overlying the left lower lung zone. Aortic calcifications
are noted.
IMPRESSION: 1. A 1.1 cm rounded density overlying the left lower lung zone.
Follow-up with a nonemergent outpatient two-view chest x-ray with
nipple markers is recommended.
2. Aortic atherosclerosis.
3. No acute cardiopulmonary findings.

## 2019-04-05 MED ORDER — DEXTROSE-NACL 5-0.45 % IV SOLN: INTRAVENOUS | Status: DC

## 2019-04-05 MED ORDER — SODIUM CHLORIDE 0.9 % IV BOLUS
1000.0000 mL | Freq: Once | INTRAVENOUS | Status: AC
  Administered 2019-04-05: 1000 mL via INTRAVENOUS

## 2019-04-05 MED ORDER — DEXTROSE 50 % IV SOLN: 0.0000 mL | INTRAVENOUS | Status: DC | PRN

## 2019-04-05 MED ORDER — LABETALOL HCL 5 MG/ML IV SOLN
10.0000 mg | Freq: Once | INTRAVENOUS | Status: AC
  Administered 2019-04-05: 10 mg via INTRAVENOUS
  Filled 2019-04-05: qty 4

## 2019-04-05 MED ORDER — PRAVASTATIN SODIUM 20 MG PO TABS
40.0000 mg | ORAL_TABLET | Freq: Every day | ORAL | Status: DC
  Administered 2019-04-06: 40 mg via ORAL
  Filled 2019-04-05: qty 2
  Filled 2019-04-05: qty 1

## 2019-04-05 MED ORDER — ENOXAPARIN SODIUM 30 MG/0.3ML ~~LOC~~ SOLN
30.0000 mg | SUBCUTANEOUS | Status: DC
  Administered 2019-04-05: 30 mg via SUBCUTANEOUS
  Filled 2019-04-05 (×2): qty 0.3

## 2019-04-05 MED ORDER — ASPIRIN 81 MG PO CHEW
81.0000 mg | CHEWABLE_TABLET | Freq: Every day | ORAL | Status: DC
  Administered 2019-04-05 – 2019-04-07 (×3): 81 mg via ORAL
  Filled 2019-04-05 (×3): qty 1

## 2019-04-05 MED ORDER — LABETALOL HCL 5 MG/ML IV SOLN: 10.0000 mg | INTRAVENOUS | Status: DC | PRN

## 2019-04-05 MED ORDER — FENTANYL CITRATE (PF) 100 MCG/2ML IJ SOLN
50.0000 ug | Freq: Once | INTRAMUSCULAR | Status: AC
  Administered 2019-04-05: 50 ug via INTRAVENOUS
  Filled 2019-04-05: qty 2

## 2019-04-05 MED ORDER — FAMOTIDINE 20 MG PO TABS
20.0000 mg | ORAL_TABLET | Freq: Two times a day (BID) | ORAL | Status: DC
  Administered 2019-04-05 – 2019-04-07 (×4): 20 mg via ORAL
  Filled 2019-04-05 (×4): qty 1

## 2019-04-05 MED ORDER — SODIUM CHLORIDE 0.9 % IV SOLN: INTRAVENOUS | Status: DC

## 2019-04-05 MED ORDER — INSULIN REGULAR(HUMAN) IN NACL 100-0.9 UT/100ML-% IV SOLN
INTRAVENOUS | Status: DC
  Filled 2019-04-05: qty 100

## 2019-04-05 MED ORDER — ONDANSETRON HCL 4 MG/2ML IJ SOLN
4.0000 mg | Freq: Once | INTRAMUSCULAR | Status: AC
  Administered 2019-04-05: 4 mg via INTRAVENOUS
  Filled 2019-04-05: qty 2

## 2019-04-05 MED ORDER — INSULIN REGULAR(HUMAN) IN NACL 100-0.9 UT/100ML-% IV SOLN
INTRAVENOUS | Status: DC
  Administered 2019-04-05: 9.5 [IU]/h via INTRAVENOUS
  Filled 2019-04-05: qty 100

## 2019-04-05 NOTE — ED Notes (Signed)
Pt st did not take his morning medication including insulin and hydrochlorothiazide  today due to "not feeling well".

## 2019-04-05 NOTE — ED Notes (Signed)
This RN Sharia Reeve to provide update at this time.

## 2019-04-05 NOTE — ED Notes (Signed)
Date and time results received: 04/05/19 1907 (use smartphrase ".now" to insert current time)  Test: cbg Critical Value: 781  Name of Provider Notified: Paduchowski  Orders Received? Or Actions Taken?: Actions Taken: edp paduchowski made aware

## 2019-04-05 NOTE — ED Notes (Signed)
ED Provider Paduchowski at bedside. 

## 2019-04-05 NOTE — ED Notes (Signed)
Date and time results received: 04/05/19 2145 (use smartphrase ".now" to insert current time)  Test: CBG Critical Value: 657  Name of Provider Notified: Sharlene Motts MD  Orders Received? Or Actions Taken?: Actions Taken: Sharlene Motts MD made aware

## 2019-04-05 NOTE — H&P (Signed)
History and Physical    Nicholas Weiss TGP:[id number] DOB: 1950-05-09 DOA: 04/05/2019  PCP: Birdie Sons, MD   Patient coming from: home  I have personally briefly reviewed patient's old medical records in [place]  Chief Complaint: Nausea, vomiting and weakness  HPI: Nicholas Weiss is a 69 y.o. male with medical history significant for insulin-dependent type 2 diabetes, hypertension, CKD 3, hospitalized in 2018 with DKA who presents to the emergency room with a several day onset of progressive weakness now associated with nausea and vomiting.  He has not been taking his medication as prescribed.  Says he was tested negative for Covid 2 weeks prior.  He denies abdominal pain or change in bowel habits.  He denies fever, cough or shortness of breath.  ED Course: On arrival in the emergency room he was afebrile with a temperature of 96 9, heart rate 92 respirations 18 with O2 sat 96% on room air.  His blood pressure was initially 110/70, increasing to 200/95 while in the ER requiring a single dose of IV labetalol.  On his blood work, blood sugar was 781 and he had an anion gap of 25.  Bicarb was 13, venous pH 7.24 and creatinine of 2.6 above baseline of 1.6 on last documentation in September 2020.  Troponin 16.  White cell count was normal, hemoglobin at baseline at 11.1.  Fit test was negative.  Urinalysis unremarkable.  EKG sinus tachycardia.  Lipase and chest x-ray pending. Review of Systems: As per HPI otherwise 10 point review of systems negative.  Past Medical History:  Diagnosis Date  . Diabetes mellitus without complication ([place])   . DKA (diabetic ketoacidoses) ([place]) 04/06/2016  . Hypercholesteremia   . Hypertension     Past Surgical History:  Procedure Laterality Date  . KNEE SURGERY Right    Torn meniscus  . KNEE SURGERY Left      reports that he has never smoked. He has never used smokeless tobacco. He reports current alcohol use of about 1.0 standard drinks of  alcohol per week. He reports current drug use. Drug: Marijuana.  Allergies  Allergen Reactions  . Penicillins Other (See Comments)    Syncope  Has patient had a PCN reaction causing immediate rash, facial/tongue/throat swelling, SOB or lightheadedness with hypotension: No Has patient had a PCN reaction causing severe rash involving mucus membranes or skin necrosis: No Has patient had a PCN reaction that required hospitalization No Has patient had a PCN reaction occurring within the last 10 years: No If all of the above answers are "NO", then may proceed with Cephalosporin use.     Family History  Problem Relation Age of Onset  . Heart attack Father   . Hypertension Sister   . Cancer Sister      Prior to Admission medications   Medication Sig Start Date End Date Taking? Authorizing Provider  aspirin 81 MG chewable tablet Chew 1 tablet by mouth daily.    [provider]  diclofenac sodium (VOLTAREN) 1 % GEL Apply 2 g topically 4 (four) times daily. Patient not taking: Reported on 04/04/2019 08/27/17   Laban Emperor, PA-C  famotidine (PEPCID) 20 MG tablet Take 1 tablet (20 mg total) by mouth 2 (two) times daily. 04/02/18 04/02/19  Darel Hong, MD  gabapentin (NEURONTIN) 300 MG capsule Take 1 capsule (300 mg total) by mouth at bedtime. PLEASE DISREGARD PREVIOUS PRESCRIPTION FOR THAT WAS FOR 30 TABLETS 12/19/18   Birdie Sons, MD  hydrochlorothiazide (HYDRODIURIL)  50 MG tablet Take 1 tablet by mouth once daily 06/22/18   Birdie Sons, MD  insulin degludec (TRESIBA FLEXTOUCH) 100 UNIT/ML SOPN FlexTouch Pen Start at 30 units once a day, increase as directed to keep fasting glucose <120 12/19/18   Birdie Sons, MD  insulin regular (NOVOLIN R RELION) 100 units/mL injection Inject 0.04-0.12 mLs (4-12 Units total) into the skin 3 (three) times daily before meals. According to sliding scale 01/03/18   Birdie Sons, MD  lovastatin (MEVACOR) 40 MG tablet Take 1 tablet (40 mg  total) by mouth at bedtime. 08/02/18   Birdie Sons, MD  pantoprazole (PROTONIX) 40 MG tablet Take 1 tablet (40 mg total) by mouth daily. For acid reflux Patient not taking: Reported on 04/04/2019 12/19/18   Birdie Sons, MD    Physical Exam: Vitals:   04/05/19 1756 04/05/19 1758 04/05/19 1830 04/05/19 1900  BP:   (!) 194/88 (!) 200/95  Pulse: (!) 104  (!) 107 (!) 110  Resp: 20  14 15   Temp: 98 F (36.7 C)     TempSrc: Oral     SpO2: 100%  97% 100%  Weight:  73.9 kg    Height:  5\' 10"  (1.778 m)       Vitals:   04/05/19 1756 04/05/19 1758 04/05/19 1830 04/05/19 1900  BP:   (!) 194/88 (!) 200/95  Pulse: (!) 104  (!) 107 (!) 110  Resp: 20  14 15   Temp: 98 F (36.7 C)     TempSrc: Oral     SpO2: 100%  97% 100%  Weight:  73.9 kg    Height:  5\' 10"  (1.778 m)      Constitutional: NAD, drowsy but easily arousable and oriented x 3 Eyes: PERRL, lids and conjunctivae normal ENMT: Mucous membranes are moist.  Neck: normal, supple, no masses, no thyromegaly Respiratory: clear to auscultation bilaterally, no wheezing, no crackles. Normal respiratory effort. No accessory muscle use.  Cardiovascular: Regular rate and rhythm, no murmurs / rubs / gallops. No extremity edema. 2+ pedal pulses. No carotid bruits.  Abdomen: no tenderness, no masses palpated. No hepatosplenomegaly. Bowel sounds positive.  Musculoskeletal: no clubbing / cyanosis. No joint deformity upper and lower extremities.  Skin: no rashes, lesions, ulcers.  Neurologic: No gross focal neurologic deficit. Psychiatric: Normal mood and affect.   Labs on Admission: I have personally reviewed following labs and imaging studies  CBC: Recent Labs  Lab 04/04/19 1205 04/05/19 1804  WBC 4.5 6.7  HGB 10.9* 11.1*  HCT 33.2* 34.4*  MCV 91.0 92.7  PLT 213 161   Basic Metabolic Panel: Recent Labs  Lab 04/04/19 1205 04/05/19 1804  NA 135 132*  K 3.6 5.2*  CL 100 94*  CO2 23 13*  GLUCOSE 313* 781*  BUN 41* 47*   CREATININE 2.13* 2.60*  CALCIUM 9.1 9.0   GFR: Estimated Creatinine Clearance: 28.9 mL/min (A) (by C-G formula based on SCr of 2.6 mg/dL (H)). Liver Function Tests: Recent Labs  Lab 04/04/19 1205 04/05/19 1804  AST 15 22  ALT 11 14  ALKPHOS 65 68  BILITOT 0.6 1.4*  PROT 8.9* 9.1*  ALBUMIN 3.7 3.8   No results for input(s): LIPASE, AMYLASE in the last 168 hours. No results for input(s): AMMONIA in the last 168 hours. Coagulation Profile: No results for input(s): INR, PROTIME in the last 168 hours. Cardiac Enzymes: No results for input(s): CKTOTAL, CKMB, CKMBINDEX, TROPONINI in the last 168 hours. BNP (last 3  results) No results for input(s): PROBNP in the last 8760 hours. HbA1C: Recent Labs    04/04/19 1022  HGBA1C 11.7*   CBG: Recent Labs  Lab 04/05/19 1806  GLUCAP >600*   Lipid Profile: No results for input(s): CHOL, HDL, LDLCALC, TRIG, CHOLHDL, LDLDIRECT in the last 72 hours. Thyroid Function Tests: No results for input(s): TSH, T4TOTAL, FREET4, T3FREE, THYROIDAB in the last 72 hours. Anemia Panel: No results for input(s): VITAMINB12, FOLATE, FERRITIN, TIBC, IRON, RETICCTPCT in the last 72 hours. Urine analysis:    Component Value Date/Time   COLORURINE STRAW (A) 04/05/2019 1804   APPEARANCEUR CLEAR (A) 04/05/2019 1804   LABSPEC 1.021 04/05/2019 1804   PHURINE 5.0 04/05/2019 1804   GLUCOSEU >=500 (A) 04/05/2019 1804   HGBUR NEGATIVE 04/05/2019 1804   BILIRUBINUR NEGATIVE 04/05/2019 1804   KETONESUR 20 (A) 04/05/2019 1804   PROTEINUR NEGATIVE 04/05/2019 1804   NITRITE NEGATIVE 04/05/2019 1804   LEUKOCYTESUR NEGATIVE 04/05/2019 1804    Radiological Exams on Admission: No results found.  EKG: Independently reviewed.  Assessment/Plan Active Problems:   DKA (diabetic ketoacidoses) ([place]) --Suspect related to medication noncompliance.  No evidence of acute infection at this time.  Troponin negative, lipase 19 and chest x-ray, no  infecton but lung nodule  seen. --Patient already started on IV bolus from the emergency room --Continue IV hydration with 1/2 normal saline, switching to D5 half NS when blood sugar falls below 250 --Serial BMPs, beta hydroxybutyric acid as ordered --A1c test as baseline glycemic control --IV insulin per protocol with plans to transition to subcutaneous insulin once anion gap closes --Supportive care with IV antiemetics, IV PPI or H2 blockers  Lung nodule CXR showed1.1 cm rounded density overlying the left lower lung zone. Follow-up with  two-view chest x-ray with nipple markers as recommended  Hypertensive urgency --BP was 200/95 in the ER, though he was normotensive on arrival --Related to poor medication compliance, as well as vomiting --IV labetalol was administered in the emergency room --Continue as needed IV labetalol in view of vomiting --Hold home hydrochlorothiazide. --Consider starting oral amlodipine    AKI (acute kidney injury) ([place]) superimposed on CKD 3 --Suspect related to intractable vomiting and DKA IV hydration Monitor renal function and avoid nephrotoxins Nephrology consult if worsening      DVT prophylaxis: lovenox  Code Status: full  Family Communication: none  Disposition Plan: Back to previous home environment Consults called: none     Athena Masse MD [facility]     04/05/2019, 7:37 PM

## 2019-04-05 NOTE — ED Provider Notes (Signed)
[facility] Emergency Department Provider Note  Time seen: 5:52 PM  I have reviewed the triage vital signs and the nursing notes.   HISTORY  Chief Complaint Hyperglycemia Weakness Vomiting   HPI Nicholas Weiss is a 69 y.o. male with a past medical history of diabetes, prior DKA, hypertension, hyperlipidemia, presents to the emergency department for generalized weakness nausea vomiting and high blood glucose.  According to the patient for the past 24 hours he has been nauseated with frequent episodes of vomiting abdominal soreness, feels weak and dehydrated.  Patient's blood glucose read "high" by EMS.  Patient does not know what his blood glucose has been at home.  Patient denies any fever cough or shortness of breath.  States he lives in a group home where there are known Covid positive patients.   Past Medical History:  Diagnosis Date  . Diabetes mellitus without complication ([place])   . DKA (diabetic ketoacidoses) ([place]) 04/06/2016  . Hypercholesteremia   . Hypertension     Patient Active Problem List   Diagnosis Date Noted  . Hypercholesterolemia 01/24/2016  . Essential (primary) hypertension 06/07/2006  . DM neuropathy with neurologic complication ([place]) 60/12/9321    Past Surgical History:  Procedure Laterality Date  . KNEE SURGERY Right    Torn meniscus  . KNEE SURGERY Left     Prior to Admission medications   Medication Sig Start Date End Date Taking? Authorizing Provider  aspirin 81 MG chewable tablet Chew 1 tablet by mouth daily.    [provider]  diclofenac sodium (VOLTAREN) 1 % GEL Apply 2 g topically 4 (four) times daily. Patient not taking: Reported on 04/04/2019 08/27/17   Laban Emperor, PA-C  famotidine (PEPCID) 20 MG tablet Take 1 tablet (20 mg total) by mouth 2 (two) times daily. 04/02/18 04/02/19  Darel Hong, MD  gabapentin (NEURONTIN) 300 MG capsule Take 1 capsule (300 mg total) by mouth at bedtime. PLEASE DISREGARD PREVIOUS  PRESCRIPTION FOR THAT WAS FOR 30 TABLETS 12/19/18   Birdie Sons, MD  hydrochlorothiazide (HYDRODIURIL) 50 MG tablet Take 1 tablet by mouth once daily 06/22/18   Birdie Sons, MD  insulin degludec (TRESIBA FLEXTOUCH) 100 UNIT/ML SOPN FlexTouch Pen Start at 30 units once a day, increase as directed to keep fasting glucose <120 12/19/18   Birdie Sons, MD  insulin regular (NOVOLIN R RELION) 100 units/mL injection Inject 0.04-0.12 mLs (4-12 Units total) into the skin 3 (three) times daily before meals. According to sliding scale 01/03/18   Birdie Sons, MD  lovastatin (MEVACOR) 40 MG tablet Take 1 tablet (40 mg total) by mouth at bedtime. 08/02/18   Birdie Sons, MD  pantoprazole (PROTONIX) 40 MG tablet Take 1 tablet (40 mg total) by mouth daily. For acid reflux Patient not taking: Reported on 04/04/2019 12/19/18   Birdie Sons, MD    Allergies  Allergen Reactions  . Penicillins Other (See Comments)    Syncope  Has patient had a PCN reaction causing immediate rash, facial/tongue/throat swelling, SOB or lightheadedness with hypotension: No Has patient had a PCN reaction causing severe rash involving mucus membranes or skin necrosis: No Has patient had a PCN reaction that required hospitalization No Has patient had a PCN reaction occurring within the last 10 years: No If all of the above answers are "NO", then may proceed with Cephalosporin use.     Family History  Problem Relation Age of Onset  . Heart attack Father   . Hypertension  Sister   . Cancer Sister     Social History Social History   Tobacco Use  . Smoking status: Never Smoker  . Smokeless tobacco: Never Used  Substance Use Topics  . Alcohol use: Yes    Alcohol/week: 1.0 standard drinks    Types: 1 Cans of beer per week    Comment: weekly  . Drug use: Yes    Types: Marijuana    Comment: Last used 01/08/15    Review of Systems Constitutional: Negative for fever.  Positive for generalized weakness and  fatigue Cardiovascular: Negative for chest pain. Respiratory: Negative for shortness of breath.  Negative for cough. Gastrointestinal: Mild abdominal soreness.  Positive for nausea vomiting. Genitourinary: Negative for urinary compaints Musculoskeletal: Negative for musculoskeletal complaints Neurological: Negative for headache All other ROS negative  ____________________________________________   PHYSICAL EXAM:  Constitutional: Alert and oriented.  Appears fatigued but no acute distress. Eyes: Normal exam ENT      Head: Normocephalic and atraumatic.      Mouth/Throat: Dry mucous membranes Cardiovascular: Regular rhythm rate around 100 bpm. Respiratory: Normal respiratory effort without tachypnea nor retractions. Breath sounds are clear  Gastrointestinal: Soft, mild tenderness diffusely without focal tenderness identified.  No rebound guarding or distention. Musculoskeletal: Nontender with normal range of motion in all extremities.  Neurologic:  Normal speech and language. No gross focal neurologic deficits Skin:  Skin is warm, dry and intact.  Psychiatric: Mood and affect are normal.  ____________________________________________    EKG  EKG viewed and interpreted by myself shows sinus tachycardia 105 bpm with a narrow QRS, normal axis, normal intervals, nonspecific ST changes.  ____________________________________________  INITIAL IMPRESSION / ASSESSMENT AND PLAN / ED COURSE  Pertinent labs & imaging results that were available during my care of the patient were reviewed by me and considered in my medical decision making (see chart for details).   Patient presents to the emergency department for generalized fatigue weakness nausea vomiting found to have "high" blood glucose.  Differential this time would include DKA, Covid, hyperglycemia, HHS, infectious etiology, metabolic or electrolyte abnormality, dehydration.  We will check labs including VBG and troponin, EKG.  We will  IV hydrate and continue to closely monitor in the emergency department.  We will also check a rapid Covid swab.  Covid swab is negative.  Patient's labs have resulted showing significant hyperglycemia with significant anion gap and VBG pH is 7.24, overall picture is consistent with diabetic ketoacidosis.  We will continue with IV hydration for a total of 2 L, I have ordered an insulin infusion.  Patient is quite hypertensive 200/95, states he was unable to take any medications this morning due to nausea and vomiting.  I have dose 10 mg of IV labetalol for the patient.  Spoke to the hospitalist and they will be admitting the patient to their service for further treatment.  Nicholas Weiss was evaluated in Emergency Department on 04/05/2019 for the symptoms described in the history of present illness. He was evaluated in the context of the global COVID-19 pandemic, which necessitated consideration that the patient might be at risk for infection with the SARS-CoV-2 virus that causes COVID-19. Institutional protocols and algorithms that pertain to the evaluation of patients at risk for COVID-19 are in a state of rapid change based on information released by regulatory bodies including the CDC and federal and state organizations. These policies and algorithms were followed during the patient's care in the ED.  CRITICAL CARE Performed by: Harvest Dark  Total critical care time: 30 minutes  Critical care time was exclusive of separately billable procedures and treating other patients.  Critical care was necessary to treat or prevent imminent or life-threatening deterioration.  Critical care was time spent personally by me on the following activities: development of treatment plan with patient and/or surrogate as well as nursing, discussions with consultants, evaluation of patient's response to treatment, examination of patient, obtaining history from patient or surrogate, ordering and performing  treatments and interventions, ordering and review of laboratory studies, ordering and review of radiographic studies, pulse oximetry and re-evaluation of patient's condition.  ____________________________________________   FINAL CLINICAL IMPRESSION(S) / ED DIAGNOSES  Diabetic ketoacidosis Nausea vomiting   Harvest Dark, MD 04/05/19 1929

## 2019-04-05 NOTE — ED Notes (Signed)
This Rn sent a message to pharmacy due to IV insulin reg (Myxredlin) out of stock at this time. Pharmacist, Gerald Stabs st he will send medication to this RN.

## 2019-04-05 NOTE — ED Triage Notes (Signed)
Pt from [facility] via [place]. Per EMS, pt c/o SHOB, lethargic today. Pt COVID neg 2 weeks ago. Per EMS CBG read "high". Pt given 200NS in route.

## 2019-04-06 ENCOUNTER — Inpatient Hospital Stay: Payer: Medicare HMO

## 2019-04-06 DIAGNOSIS — N1831 Chronic kidney disease, stage 3a: Secondary | ICD-10-CM

## 2019-04-06 DIAGNOSIS — N179 Acute kidney failure, unspecified: Secondary | ICD-10-CM

## 2019-04-06 DIAGNOSIS — E1310 Other specified diabetes mellitus with ketoacidosis without coma: Secondary | ICD-10-CM

## 2019-04-06 DIAGNOSIS — I160 Hypertensive urgency: Secondary | ICD-10-CM

## 2019-04-06 LAB — BASIC METABOLIC PANEL
Anion gap: 10 (ref 5–15)
Anion gap: 13 (ref 5–15)
Anion gap: 13 (ref 5–15)
Anion gap: 9 (ref 5–15)
BUN: 45 mg/dL — ABNORMAL HIGH (ref 8–23)
BUN: 46 mg/dL — ABNORMAL HIGH (ref 8–23)
BUN: 47 mg/dL — ABNORMAL HIGH (ref 8–23)
BUN: 48 mg/dL — ABNORMAL HIGH (ref 8–23)
CO2: 19 mmol/L — ABNORMAL LOW (ref 22–32)
CO2: 20 mmol/L — ABNORMAL LOW (ref 22–32)
CO2: 21 mmol/L — ABNORMAL LOW (ref 22–32)
CO2: 22 mmol/L (ref 22–32)
Calcium: 8.3 mg/dL — ABNORMAL LOW (ref 8.9–10.3)
Calcium: 8.4 mg/dL — ABNORMAL LOW (ref 8.9–10.3)
Calcium: 8.4 mg/dL — ABNORMAL LOW (ref 8.9–10.3)
Calcium: 8.4 mg/dL — ABNORMAL LOW (ref 8.9–10.3)
Chloride: 107 mmol/L (ref 98–111)
Chloride: 108 mmol/L (ref 98–111)
Chloride: 110 mmol/L (ref 98–111)
Chloride: 111 mmol/L (ref 98–111)
Creatinine, Ser: 2.06 mg/dL — ABNORMAL HIGH (ref 0.61–1.24)
Creatinine, Ser: 2.18 mg/dL — ABNORMAL HIGH (ref 0.61–1.24)
Creatinine, Ser: 2.39 mg/dL — ABNORMAL HIGH (ref 0.61–1.24)
Creatinine, Ser: 2.56 mg/dL — ABNORMAL HIGH (ref 0.61–1.24)
GFR calc Af Amer: 29 mL/min — ABNORMAL LOW (ref >60)
GFR calc Af Amer: 32 mL/min — ABNORMAL LOW (ref >60)
GFR calc Af Amer: 35 mL/min — ABNORMAL LOW (ref >60)
GFR calc Af Amer: 38 mL/min — ABNORMAL LOW (ref >60)
GFR calc non Af Amer: 25 mL/min — ABNORMAL LOW (ref >60)
GFR calc non Af Amer: 27 mL/min — ABNORMAL LOW (ref >60)
GFR calc non Af Amer: 30 mL/min — ABNORMAL LOW (ref >60)
GFR calc non Af Amer: 33 mL/min — ABNORMAL LOW (ref >60)
Glucose, Bld: 140 mg/dL — ABNORMAL HIGH (ref 70–99)
Glucose, Bld: 227 mg/dL — ABNORMAL HIGH (ref 70–99)
Glucose, Bld: 229 mg/dL — ABNORMAL HIGH (ref 70–99)
Glucose, Bld: 454 mg/dL — ABNORMAL HIGH (ref 70–99)
Potassium: 3.5 mmol/L (ref 3.5–5.1)
Potassium: 3.7 mmol/L (ref 3.5–5.1)
Potassium: 3.9 mmol/L (ref 3.5–5.1)
Potassium: 4 mmol/L (ref 3.5–5.1)
Sodium: 139 mmol/L (ref 135–145)
Sodium: 141 mmol/L (ref 135–145)
Sodium: 141 mmol/L (ref 135–145)
Sodium: 142 mmol/L (ref 135–145)

## 2019-04-06 LAB — GLUCOSE, CAPILLARY
Glucose-Capillary: 136 mg/dL — ABNORMAL HIGH (ref 70–99)
Glucose-Capillary: 137 mg/dL — ABNORMAL HIGH (ref 70–99)
Glucose-Capillary: 137 mg/dL — ABNORMAL HIGH (ref 70–99)
Glucose-Capillary: 164 mg/dL — ABNORMAL HIGH (ref 70–99)
Glucose-Capillary: 165 mg/dL — ABNORMAL HIGH (ref 70–99)
Glucose-Capillary: 217 mg/dL — ABNORMAL HIGH (ref 70–99)
Glucose-Capillary: 221 mg/dL — ABNORMAL HIGH (ref 70–99)
Glucose-Capillary: 259 mg/dL — ABNORMAL HIGH (ref 70–99)
Glucose-Capillary: 276 mg/dL — ABNORMAL HIGH (ref 70–99)
Glucose-Capillary: 358 mg/dL — ABNORMAL HIGH (ref 70–99)
Glucose-Capillary: 409 mg/dL — ABNORMAL HIGH (ref 70–99)
Glucose-Capillary: 76 mg/dL (ref 70–99)
Glucose-Capillary: 92 mg/dL (ref 70–99)

## 2019-04-06 LAB — BETA-HYDROXYBUTYRIC ACID
Beta-Hydroxybutyric Acid: 0.07 mmol/L (ref 0.05–0.27)
Beta-Hydroxybutyric Acid: 1.93 mmol/L — ABNORMAL HIGH (ref 0.05–0.27)

## 2019-04-06 LAB — SARS CORONAVIRUS 2 (TAT 6-24 HRS): SARS Coronavirus 2: NEGATIVE

## 2019-04-06 IMAGING — CR DG CHEST 2V
1 series · 2 of 2 positions shown · non-contrast
Comparison: [DATE].  [DATE].

CLINICAL DATA: Follow-up with nipple marker.

EXAM:
CHEST - 2 VIEW

[Series 1: dg chest 2 view · 0.14mm/px · 2 of 2 slices shown]
[im 1/2]
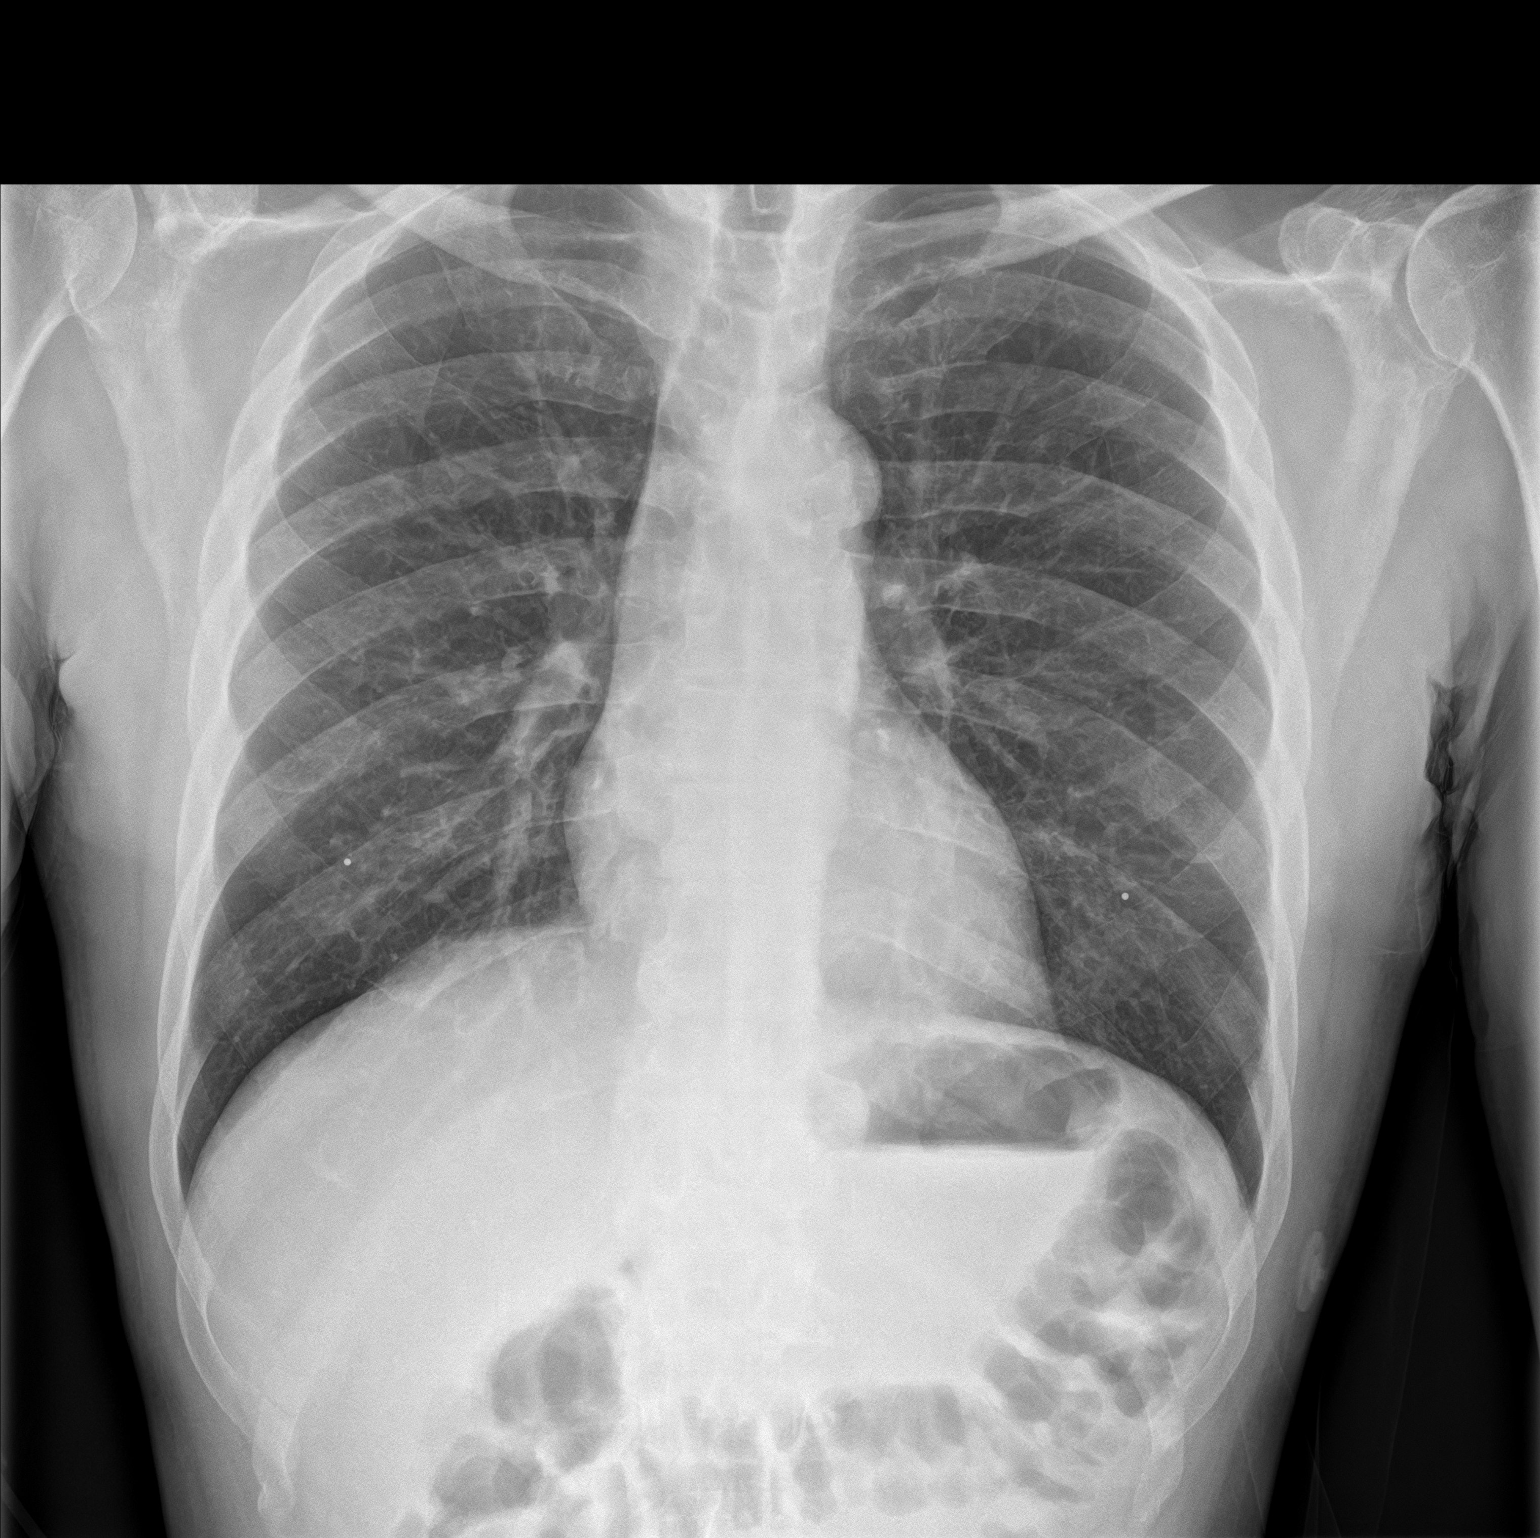
[im 2/2]
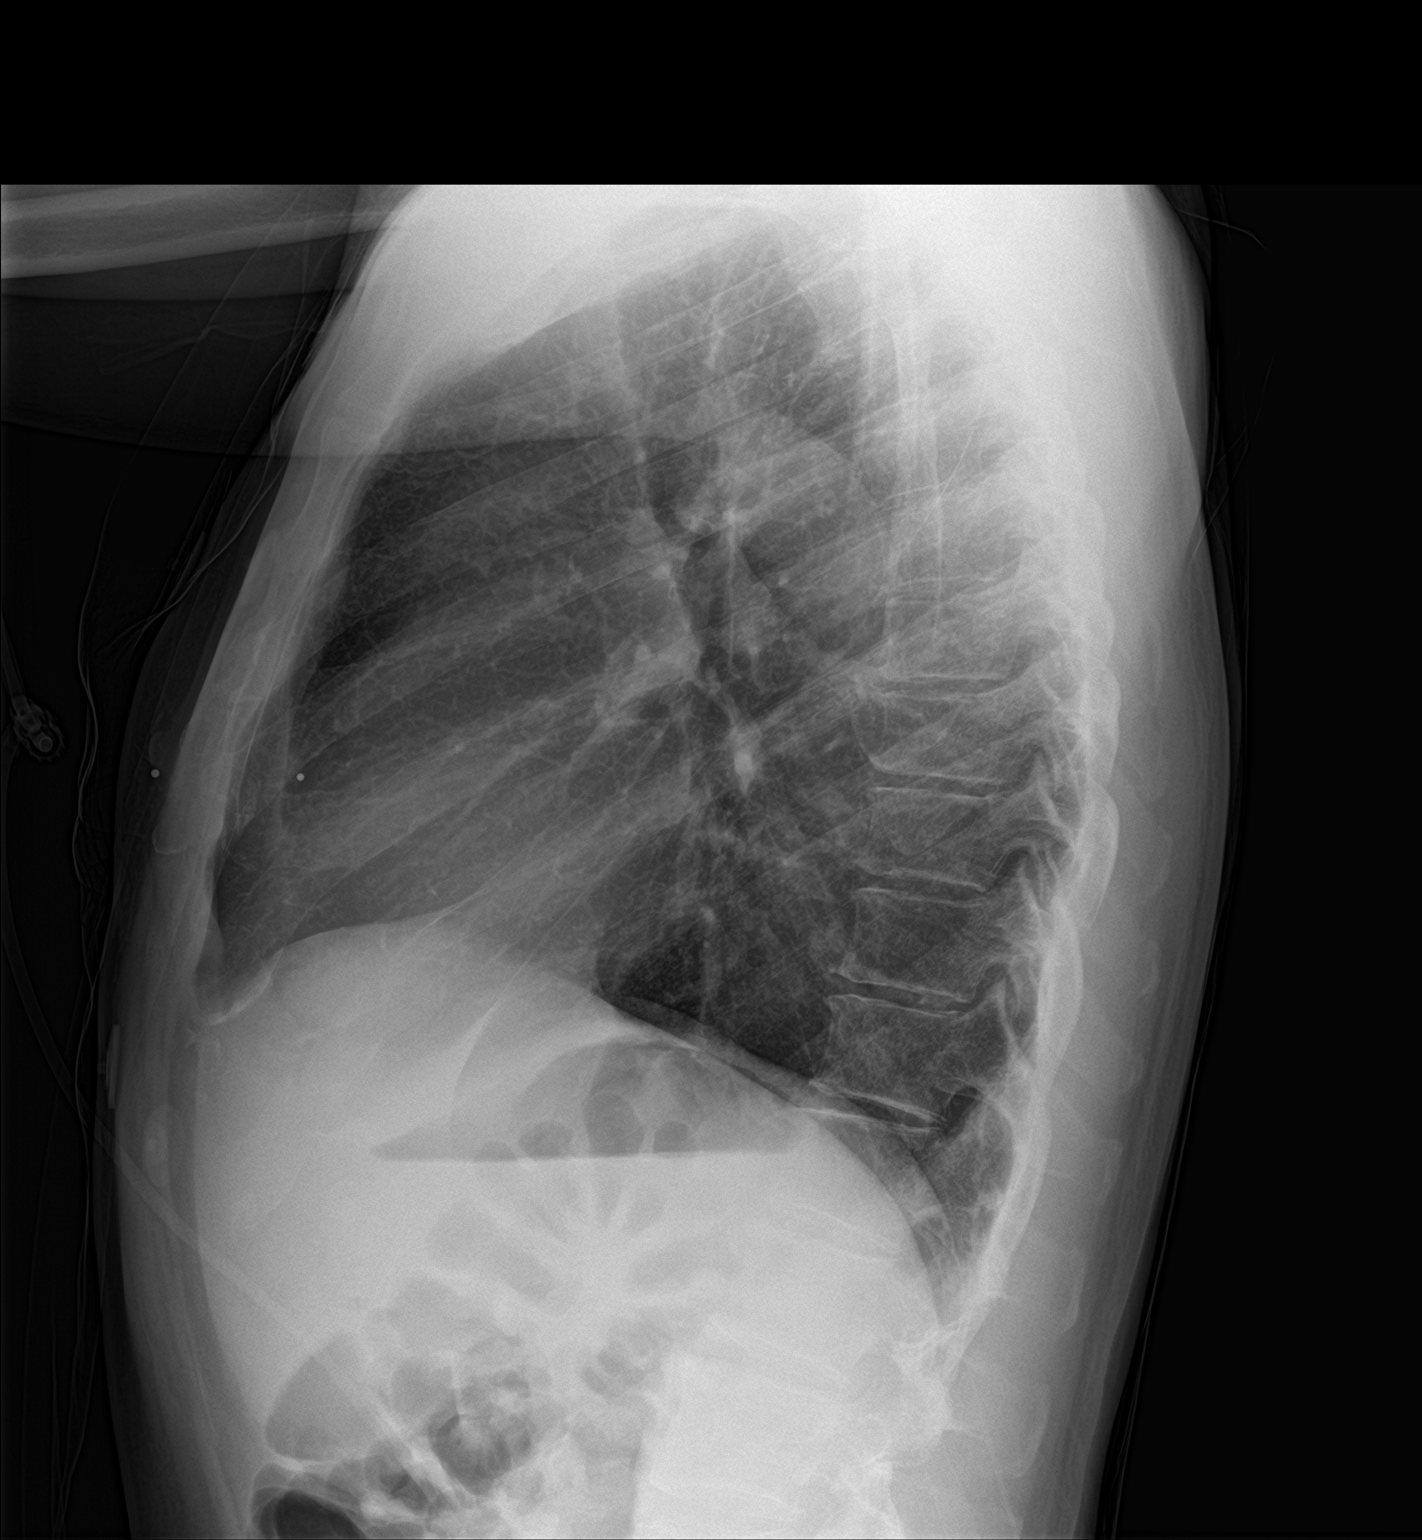

[2 of 2 positions shown; findings below may reference images not displayed]

FINDINGS: Mediastinum hilar structures normal. No clear-cut nodule noted on
today's exam. Nipple shadows are noted. No acute infiltrate. No
pleural effusion or pneumothorax. Heart size normal.
IMPRESSION: No clearcut nodule noted on today's exam. No acute cardiopulmonary
disease.

## 2019-04-06 MED ORDER — AMLODIPINE BESYLATE 5 MG PO TABS
5.0000 mg | ORAL_TABLET | Freq: Every day | ORAL | Status: DC
  Administered 2019-04-06 – 2019-04-07 (×2): 5 mg via ORAL
  Filled 2019-04-06: qty 1

## 2019-04-06 MED ORDER — INSULIN GLARGINE 100 UNIT/ML ~~LOC~~ SOLN
25.0000 [IU] | Freq: Every day | SUBCUTANEOUS | Status: DC
  Administered 2019-04-06 – 2019-04-07 (×2): 25 [IU] via SUBCUTANEOUS
  Filled 2019-04-06 (×2): qty 0.25

## 2019-04-06 MED ORDER — INSULIN ASPART 100 UNIT/ML ~~LOC~~ SOLN
0.0000 [IU] | SUBCUTANEOUS | Status: DC
  Administered 2019-04-06: 3 [IU] via SUBCUTANEOUS
  Administered 2019-04-06: 7 [IU] via SUBCUTANEOUS
  Administered 2019-04-06: 11 [IU] via SUBCUTANEOUS
  Filled 2019-04-06 (×3): qty 1

## 2019-04-06 MED ORDER — ENOXAPARIN SODIUM 40 MG/0.4ML ~~LOC~~ SOLN
40.0000 mg | SUBCUTANEOUS | Status: DC
  Administered 2019-04-06: 40 mg via SUBCUTANEOUS
  Filled 2019-04-06: qty 0.4

## 2019-04-06 NOTE — ED Notes (Signed)
Patient transported to X-ray 

## 2019-04-06 NOTE — ED Notes (Signed)
Gave pt meal tray.  Pt is sleeping at this time

## 2019-04-06 NOTE — Progress Notes (Signed)
PHARMACIST - PHYSICIAN COMMUNICATION  CONCERNING:  Enoxaparin (Lovenox) for DVT Prophylaxis    RECOMMENDATION: Patient was prescribed enoxaprin 30mg  q24 hours for VTE prophylaxis for CrCl <20ml/min  Filed Weights   04/05/19 1758  Weight: 163 lb (73.9 kg)    Body mass index is 23.39 kg/m.  Estimated Creatinine Clearance: 36.4 mL/min (A) (by C-G formula based on SCr of 2.06 mg/dL (H)).  Renal function has improved.   Based on [place] patient is candidate for enoxaparin 40mg  every 24  CrCl >23ml/min   DESCRIPTION: Pharmacy has adjusted enoxaparin dose per [facility] policy.  Patient is now receiving enoxaparin 40mg  every 24 hours.   Pernell Dupre, PharmD, BCPS Clinical Pharmacist 04/06/2019 1:45 PM

## 2019-04-06 NOTE — ED Notes (Signed)
Call from family member, gave phone to pt to update her. Called pharmacy to send lantus.  Gave pt his breakfast tray

## 2019-04-06 NOTE — Progress Notes (Signed)
PROGRESS NOTE                                                                                                                                                                                                             Patient Demographics:    Nicholas Weiss, is a 69 y.o. male, DOB - Oct 09, 1950, GYI:[id number]  Admit date - 04/05/2019   Admitting Physician Athena Masse, MD  Outpatient Primary MD for the patient is Fisher, Kirstie Peri, MD  LOS - 1    Chief Complaint  Patient presents with  . Shortness of Breath       Brief Narrative 69 year old male with insulin-dependent type 2 diabetes mellitus, chronic kidney disease stage III, hypertension, hospitalized in 2018 with DKA presented with generalized weakness, nausea and vomiting.  Patient reports that he saw his PCP 2 days back and CBG done in the office reported to be high and his Tresiba dose was increased from 30 units to 50 units daily.  He reports that his blood glucose were fairly stable in the 200s for the past week.  At work yesterday he was feeling weak and did not take his [name]. In the ED his blood sugar was 781 with an anion gap of 25, bicarb of 13 and acute on chronic kidney injury with creatinine of 2.6. Patient given aggressive IV hydration, placed on insulin drip and admitted for DKA.   Subjective:   Patient reports feeling better this morning.  No nausea or vomiting.  Still feels weak.   Assessment  & Plan :    Active Problems:   DKA (diabetic ketoacidoses) with uncontrolled type 2 diabetes mellitus ([place]) Possibly in the setting of dietary noncompliance and?  Insulin nonadherence. Tresiba dose increased by PCP just 2 days ago.  Anion gap closed.  Ordered subcu Lantus with sliding scale coverage (every 4 hours CBG monitoring).  Insulin drip to be discontinued 2 hours after supper Lantus given.  Continue IV hydration. Diabetic coordinator consulted for  education. Patient counseled on diet and medication adherence.  A1c of 12 (has been persistently >11 for the past year)  Active problems   Hypertensive urgency Possibly due to acute illness.  Blood pressure currently improved.  Patient only on HCTZ.  No proteinuria on UA. will discontinue HCTZ and add amlodipine and as needed hydralazine  AKI (acute kidney injury) superimposed on chronic kidney disease 3 ([place]) Baseline creatinine around 1.5-1.8.  Prerenal  secondary to DKA.  Avoid nephrotoxins.  Aggressive IV hydration      Code Status : Full code  Family Communication  : None  Disposition Plan  : Home possibly tomorrow if CBG stable and AKI resolved  Barriers For Discharge : Active symptoms  Consults  : None  Procedures  : None  DVT Prophylaxis  : Subcu Lovenox  Lab Results  Component Value Date   PLT 223 04/05/2019    Antibiotics  :    Anti-infectives (From admission, onward)   None        Objective:   Vitals:   04/06/19 0712 04/06/19 0735 04/06/19 0812 04/06/19 1000  BP:   117/60 (!) 147/81  Pulse: 81 82 80 80  Resp: 10 13 (!) 8 11  Temp:  98 F (36.7 C) 98.4 F (36.9 C) 98.3 F (36.8 C)  TempSrc:  Oral Oral Oral  SpO2: 99% 99% 100% 100%  Weight:      Height:        Wt Readings from Last 3 Encounters:  04/05/19 73.9 kg  04/04/19 73.9 kg  12/19/18 73.6 kg     Intake/Output Summary (Last 24 hours) at 04/06/2019 1119 Last data filed at 04/06/2019 0617 Gross per 24 hour  Intake 1000 ml  Output 500 ml  Net 500 ml     Physical Exam  Gen: not in distress, fatigued HEENT: moist mucosa, supple neck Chest: clear b/l, no added sounds CVS: N S1&S2, no murmurs, GI: soft, NT, ND, BS+ Musculoskeletal: warm, no edema     Data Review:    CBC Recent Labs  Lab 04/04/19 1205 04/05/19 1804  WBC 4.5 6.7  HGB 10.9* 11.1*  HCT 33.2* 34.4*  PLT 213 223  MCV 91.0 92.7  MCH 29.9 29.9  MCHC 32.8 32.3  RDW 12.4 12.6    Chemistries  Recent  Labs  Lab 04/04/19 1205 04/05/19 1804 04/05/19 1921 04/05/19 1930 04/05/19 2346 04/06/19 0318 04/06/19 0750  NA 135 132* 134* 136 139 141 142  K 3.6 5.2* 5.6* 5.3* 3.9 3.5 3.7  CL 100 94* 100 103 107 110 111  CO2 23 13* 14* 13* 19* 21* 22  GLUCOSE 313* 781* 714* 657* 454* 229* 140*  BUN 41* 47* 47* 44* 48* 46* 47*  CREATININE 2.13* 2.60* 2.40* 2.35* 2.56* 2.39* 2.06*  CALCIUM 9.1 9.0 8.4* 8.2* 8.4* 8.4* 8.3*  AST 15 22  --   --   --   --   --   ALT 11 14  --   --   --   --   --   ALKPHOS 65 68  --   --   --   --   --   BILITOT 0.6 1.4*  --   --   --   --   --    ------------------------------------------------------------------------------------------------------------------ No results for input(s): CHOL, HDL, LDLCALC, TRIG, CHOLHDL, LDLDIRECT in the last 72 hours.  Lab Results  Component Value Date   HGBA1C 12.0 (H) 04/05/2019   ------------------------------------------------------------------------------------------------------------------ No results for input(s): TSH, T4TOTAL, T3FREE, THYROIDAB in the last 72 hours.  Invalid input(s): FREET3 ------------------------------------------------------------------------------------------------------------------ No results for input(s): VITAMINB12, FOLATE, FERRITIN, TIBC, IRON, RETICCTPCT in the last 72 hours.  Coagulation profile No results for input(s): INR, PROTIME in the last 168 hours.  No results for input(s): DDIMER in the last 72 hours.  Cardiac Enzymes No  results for input(s): CKMB, TROPONINI, MYOGLOBIN in the last 168 hours.  Invalid input(s): CK ------------------------------------------------------------------------------------------------------------------    Component Value Date/Time   BNP 100.7 (H) 08/02/2018 1042    Inpatient Medications  Scheduled Meds: . aspirin  81 mg Oral Daily  . enoxaparin (LOVENOX) injection  30 mg Subcutaneous Q24H  . famotidine  20 mg Oral BID  . insulin aspart  0-20 Units  Subcutaneous Q4H  . insulin glargine  25 Units Subcutaneous Daily  . pravastatin  40 mg Oral q1800   Continuous Infusions: . sodium chloride Stopped (04/06/19 0617)  . insulin Stopped (04/06/19 1001)   PRN Meds:.dextrose, dextrose, labetalol  Micro Results Recent Results (from the past 240 hour(s))  SARS CORONAVIRUS 2 (TAT 6-24 HRS) Nasopharyngeal Nasopharyngeal Swab     Status: None   Collection Time: 04/05/19  7:21 PM   Specimen: Nasopharyngeal Swab  Result Value Ref Range Status   SARS Coronavirus 2 NEGATIVE NEGATIVE Final    Comment: (NOTE) SARS-CoV-2 target nucleic acids are NOT DETECTED. The SARS-CoV-2 RNA is generally detectable in upper and lower respiratory specimens during the acute phase of infection. Negative results do not preclude SARS-CoV-2 infection, do not rule out co-infections with other pathogens, and should not be used as the sole basis for treatment or other patient management decisions. Negative results must be combined with clinical observations, patient history, and epidemiological information. The expected result is Negative. Fact Sheet for Patients: SugarRoll.be Fact Sheet for Healthcare Providers: https://www.woods-mathews.com/ This test is not yet approved or cleared by the [place] FDA and  has been authorized for detection and/or diagnosis of SARS-CoV-2 by FDA under an Emergency Use Authorization (EUA). This EUA will remain  in effect (meaning this test can be used) for the duration of the COVID-19 declaration under Section 56 4(b)(1) of the Act, 21 U.S.C. section 360bbb-3(b)(1), unless the authorization is terminated or revoked sooner. Performed at [facility], [place] [address], [place], [place] [address]     Radiology Reports DG Chest 2 View  Result Date: 04/06/2019 CLINICAL DATA:  Follow-up with nipple marker. EXAM: CHEST - 2 VIEW COMPARISON:  2021.  10/17/2018. FINDINGS: Mediastinum hilar  structures normal. No clear-cut nodule noted on today's exam. Nipple shadows are noted. No acute infiltrate. No pleural effusion or pneumothorax. Heart size normal. IMPRESSION: No clearcut nodule noted on today's exam. No acute cardiopulmonary disease. Electronically Signed   By: Marcello Moores  Register   On: 04/06/2019 10:25   DG Chest Port 1 View  Result Date: 04/05/2019 CLINICAL DATA:  DKA EXAM: PORTABLE CHEST 1 VIEW COMPARISON:  October 17, 2018 FINDINGS: The heart size is normal. There is no pneumothorax or large pleural effusion. There is no focal infiltrate. There is a rounded 1.1 cm density overlying the left lower lung zone. Aortic calcifications are noted. IMPRESSION: 1. A 1.1 cm rounded density overlying the left lower lung zone. Follow-up with a nonemergent outpatient two-view chest x-ray with nipple markers is recommended. 2. Aortic atherosclerosis. 3. No acute cardiopulmonary findings. Electronically Signed   By: Constance Holster M.D.   On: 04/05/2019 20:03    Time Spent in minutes 35   Hiedi Touchton M.D on 04/06/2019 at 11:19 AM  Between 7am to 7pm - Pager - [phone]  After 7pm go to [email or web address] - password [place]  [facility] -  Office  [phone]

## 2019-04-06 NOTE — ED Notes (Signed)
Pt resting. Water given and TV turned on. New remote given. Pt in NAD at this time.

## 2019-04-06 NOTE — ED Notes (Signed)
Admitting MD notified that Lantus has been ordered and I will administer it as soon as I get it and stop insulin drip 2 hours after

## 2019-04-07 DIAGNOSIS — E109 Type 1 diabetes mellitus without complications: Secondary | ICD-10-CM

## 2019-04-07 DIAGNOSIS — E1065 Type 1 diabetes mellitus with hyperglycemia: Secondary | ICD-10-CM

## 2019-04-07 DIAGNOSIS — N179 Acute kidney failure, unspecified: Secondary | ICD-10-CM

## 2019-04-07 DIAGNOSIS — E1165 Type 2 diabetes mellitus with hyperglycemia: Secondary | ICD-10-CM

## 2019-04-07 DIAGNOSIS — IMO0002 Reserved for concepts with insufficient information to code with codable children: Secondary | ICD-10-CM

## 2019-04-07 DIAGNOSIS — N170 Acute kidney failure with tubular necrosis: Secondary | ICD-10-CM

## 2019-04-07 DIAGNOSIS — N1831 Chronic kidney disease, stage 3a: Secondary | ICD-10-CM

## 2019-04-07 DIAGNOSIS — R911 Solitary pulmonary nodule: Secondary | ICD-10-CM

## 2019-04-07 LAB — BASIC METABOLIC PANEL
Anion gap: 8 (ref 5–15)
BUN: 28 mg/dL — ABNORMAL HIGH (ref 8–23)
CO2: 23 mmol/L (ref 22–32)
Calcium: 8.2 mg/dL — ABNORMAL LOW (ref 8.9–10.3)
Chloride: 108 mmol/L (ref 98–111)
Creatinine, Ser: 1.49 mg/dL — ABNORMAL HIGH (ref 0.61–1.24)
GFR calc Af Amer: 56 mL/min — ABNORMAL LOW (ref >60)
GFR calc non Af Amer: 48 mL/min — ABNORMAL LOW (ref >60)
Glucose, Bld: 95 mg/dL (ref 70–99)
Potassium: 3.7 mmol/L (ref 3.5–5.1)
Sodium: 139 mmol/L (ref 135–145)

## 2019-04-07 LAB — GLUCOSE, CAPILLARY
Glucose-Capillary: 102 mg/dL — ABNORMAL HIGH (ref 70–99)
Glucose-Capillary: 123 mg/dL — ABNORMAL HIGH (ref 70–99)
Glucose-Capillary: 73 mg/dL (ref 70–99)

## 2019-04-07 MED ORDER — AMLODIPINE BESYLATE 10 MG PO TABS: 10.0000 mg | ORAL_TABLET | Freq: Every day | ORAL | 1 refills | Status: DC

## 2019-04-07 MED ORDER — TRESIBA FLEXTOUCH 100 UNIT/ML ~~LOC~~ SOPN: PEN_INJECTOR | SUBCUTANEOUS | 12 refills | Status: DC

## 2019-04-07 NOTE — Progress Notes (Signed)
Initial Nutrition Assessment  DOCUMENTATION CODES:   Not applicable  INTERVENTION:  Provide patient with diet education handout   NUTRITION DIAGNOSIS:   Food and nutrition related knowledge deficit related to chronic illness(IDDM) as evidenced by other (comment)(DKA; A1c 12).    GOAL:   Other (Comment)(Patient will adhere to dietary recommendations improving A1c)   MONITOR:   Labs, PO intake, Weight trends  REASON FOR ASSESSMENT:   Malnutrition Screening Tool    ASSESSMENT:  RD working remotely.  69 year old male with past medical history of T2DM, HTN, and CKD3. Patient  presented to ED with progressive weakness over the past several days associated with nausea and vomiting and admitted for DKA suspected secondary to medication noncompliance.  Unable to reach patient via phone this afternoon. Per notes, pt is discharging with recommendations to follow up with PCP. Patient with uncontrolled T2DM, blood sugar 781 in ED and his A1c has been >11 for the past year. Per notes, patient counseled on diet and medication adherence. RD will mail "Heart Healthy Consistent Carbohydrate Nutrition Therapy" handout from Academy of [employer] Dietetics for additional education.   Current wt 73.9 kg (162.58 lbs) Weight history reviewed; stable  Medications reviewed and include: SS novolog, Lantus 25 units daily,   Labs: CBGs 73-123 x 24 hrs, BUN 28 (H), Cr 1.49 (H) Lab Results  Component Value Date   HGBA1C 12.0 (H) 04/05/2019      NUTRITION - FOCUSED PHYSICAL EXAM: Unable to complete at this time, RD working remotely.  Diet Order:   Diet Order            Diet Carb Modified Fluid consistency: Thin; Room service appropriate? Yes  Diet effective now              EDUCATION NEEDS:   Education needs have been addressed  Skin:  Skin Assessment: Reviewed RN Assessment  Last BM:  1/07  Height:   Ht Readings from Last 1 Encounters:  04/05/19 5\' 10"  (1.778 m)     Weight:   Wt Readings from Last 1 Encounters:  04/05/19 73.9 kg    Ideal Body Weight:  75.5 kg  BMI:  Body mass index is 23.39 kg/m.  Estimated Nutritional Needs:   Kcal:  1800-2000  Protein:  90-100  Fluid:  >/= 1.8 L/day   Lajuan Lines, RD, LDN Clinical Nutrition [place] Telephone [phone] After Hours/Weekend Pager: [phone]

## 2019-04-07 NOTE — Discharge Instructions (Signed)
Preventing Diabetic Ketoacidosis Diabetic ketoacidosis (DKA) is a life-threatening complication of diabetes (diabetes mellitus). It develops when there is not enough of a hormone called insulin in the body. If the body does not have enough insulin, it cannot divide (break down) sugar (glucose) into usable cells, so it breaks down fats instead. This leads to the production of acids (ketones), which can cause the blood to have too much acid in it (acidosis). DKA is a medical emergency that must be treated at the hospital. You may be more likely to develop DKA if you have type 1 diabetes and you take insulin. You can prevent DKA by working closely with your health care provider to manage your diabetes. What nutrition changes can be made?   Follow your meal plan, as directed by your health care provider or diet and nutrition specialist (dietitian).  Eat healthy meals at about the same time every day. Have healthy snacks between meals.  Avoid not eating for long periods of time. Do not skip meals, especially if you are ill.  Avoid regularly eating foods that contain a lot of sugar. Also avoid drinking alcohol. Sugary food and alcohol increase your risk of high blood glucose (hyperglycemia), which increases your risk for DKA.  Drink enough fluid to keep your urine pale yellow. Dehydration increases your risk for DKA. What actions can I take to lower my risk? To lower your risk for diabetic ketoacidosis, manage your diabetes as directed by your health care provider:  Take insulin and other diabetes medicines as directed.  Check your blood glucose every day, as often as directed.  Follow your sick day plan whenever you cannot eat or drink as usual. Make this plan in advance with your health care provider.  Check your urine for ketones as often as directed. ? During times when you are sick, check your ketones every 4-6 hours. ? If you develop symptoms of DKA, check your ketones right away.  If you  have ketones in your urine: ? Contact your health care provider right away. ? Do not exercise.  Know the symptoms of DKA so that you can get treatment as soon as possible.  Make sure that people at work, home, and school know how to check your blood glucose, in case you are not able to do it yourself.  Carry a medical alert card or wear medical alert jewelry that says that you have diabetes. Why are these changes important? DKA is a warning sign that your diabetes is not being well-controlled. You may need to work with your health care provider to adjust your diabetes management plan. DKA can lead to a serious medical emergency that can be life-threatening. Where to find support For more support with preventing DKA:  Talk with your health care provider.  Consider joining a support group. The American Diabetes Association has an online support community at: community.diabetes.org/home Where to find more information Learn more about preventing DKA from:  American Diabetes Association: www.diabetes.org  American Heart Association: www.heart.org Contact a health care provider if: You develop symptoms of DKA, such as:  Fatigue.  Weight loss.  Excessive thirst.  Light-headedness.  Fruity or sweet-smelling breath.  Excessive urination.  Vision changes.  Confusion or irritability.  Nausea.  Vomiting.  Rapid breathing.  Pain in the abdomen.  Feeling warm in your face (flushed). This may or may not include a reddish color coming to your face. If you develop any of these symptoms, do not wait to see if the symptoms will  go away. Get medical help right away. Call your local emergency services (911 in the [place] Do not drive yourself to the hospital. Summary  DKA may be a warning sign that your diabetes is not being well-controlled. You may need to work with your health care provider to adjust your diabetes management plan.  Preventing high blood glucose and dehydration  helps prevent DKA.  Check your urine for ketones as often as directed. You may need to check more often when your blood glucose level is high and when you are ill.  DKA is a medical emergency. Make sure you know the symptoms so that you can recognize and get treatment right away. This information is not intended to replace advice given to you by your health care provider. Make sure you discuss any questions you have with your health care provider. Document Revised: 07/08/2018 Document Reviewed: 10/15/2016 Elsevier Patient Education  [place].

## 2019-04-07 NOTE — Progress Notes (Signed)
Inpatient Diabetes Program Recommendations  AACE/ADA: New Consensus Statement on Inpatient Glycemic Control (2015)  Target Ranges:  Prepandial:   less than 140 mg/dL      Peak postprandial:   less than 180 mg/dL (1-2 hours)      Critically ill patients:  140 - 180 mg/dL   Lab Results  Component Value Date   GLUCAP 123 (H) 04/07/2019   HGBA1C 12.0 (H) 04/05/2019    Review of Glycemic Control Results for TANK, DIFIORE (MRN [id number]) as of 04/07/2019 13:09  Ref. Range 04/06/2019 15:28 04/06/2019 20:22 04/07/2019 00:11 04/07/2019 05:35 04/07/2019 07:24  Glucose-Capillary Latest Ref Range: 70 - 99 mg/dL 259 (H) 76 73 102 (H) 123 (H)   Diabetes history: DM 2 Outpatient Diabetes medications:  Tresiba 50 units daily (just increased by MD), Novolin R 4-12 units tid with meals Current orders for Inpatient glycemic control:  Lantus 25 units daily, Novolog resistant q 4 hours  Inpatient Diabetes Program Recommendations:    Called and spoke with patient regarding A1C and DM.  Patient admits that he forgot to take his insulin the day that he came in the hospital.  He had just seen his PCP on 1/5 regarding his DM.  We briefly discussed A1C results of 12%.  He states that his MD wants his A1C to be 8% or lower.  Discussed basal versus meal coverage and normal blood sugar levels.  We also reviewed hypoglycemia signs symptoms and treatment.  Patient states "Donnald Garre got to do a better job".  Encouraged him to f/u with PCP and to call if blood sugars low <70 mg/dl or consistently high >200 mg/dL.  Patient appreciative of information.   Thanks  Adah Perl, RN, BC-ADM Inpatient Diabetes Coordinator Pager [phone] ([phone])

## 2019-04-07 NOTE — Progress Notes (Signed)
Pt discharged per MD order. IV removed. Prescription given to pt. Discharge instructions reviewed with pt. Pt verbalized understanding. All questions answered to pt satisfaction. Pt taken downstairs in wheelchair by staff.

## 2019-04-07 NOTE — Discharge Summary (Signed)
Physician Discharge Summary  Nicholas Weiss QVZ:[id number] DOB: 06/11/1950 DOA: 04/05/2019  PCP: Nicholas Sons, MD  Admit date: 04/05/2019 Discharge date: 04/07/2019  Admitted From: Home Disposition: Home  Recommendations for Outpatient Follow-up:  1. Follow up with PCP in 1-2 weeks  Home Health: None Equipment/Devices: None  Discharge Condition: Fair CODE STATUS: Full code Diet recommendation: Carb modified    Discharge Diagnoses:  Active Problems:   DKA (diabetic ketoacidoses) with uncontrolled diabetes type 1 (HCC)  Active problems   Hypertensive urgency   AKI (acute kidney injury) (HCC)   CKD (chronic kidney disease) stage 3, GFR 30-59 ml/min  Brief narrative/HPI  69 year old male with insulin-dependent type 2 diabetes mellitus, chronic kidney disease stage III, hypertension, hospitalized in 2018 with DKA presented with generalized weakness, nausea and vomiting.  Patient reports that he saw his PCP 2 days back and CBG done in the office reported to be high and his Tresiba dose was increased from 30 units to 50 units daily.  He reports that his blood glucose were fairly stable in the 200s for the past week.  At work yesterday he was feeling weak and did not take his [name]. In the ED his blood sugar was 781 with an anion gap of 25, bicarb of 13 and acute on chronic kidney injury with creatinine of 2.6. Patient given aggressive IV hydration, placed on insulin drip and admitted for DKA.   Hospital course Active Problems:   DKA (diabetic ketoacidoses) with uncontrolled type 2 diabetes mellitus ([place]) Possibly in the setting of dietary noncompliance and?  Insulin nonadherence.  Patient A1c has been persistently >11 for the past year. Tresiba dose increased to 50 units by PCP just 2 days ago, patient reports he has not taken the higher dose yet. Anion gap closed and patient given subcutaneous Lantus with premeal aspart. I will discharge him on his new dose of Tresiba (50  units) along with premeal aspart that he takes.  Patient counseled on diet and medication adherence.  Follow-up with PCP as outpatient.  Active problems   Hypertensive urgency Possibly due to acute illness.  Blood pressure currently improved.  Patient only on high dose HCTZ (50 mg). I will discontinue his HCTZ and place him on amlodipine 10 mg daily.  Adjust blood pressure medication as outpatient     AKI (acute kidney injury) superimposed on chronic kidney disease 3 (HCC) Baseline creatinine around 1.5-1.8.  Prerenal  secondary to DKA.    No proteinuria on UA.  Renal function improved to baseline with hydration.  Follow-up with PCP.   Patient stable to be discharged home with outpatient follow-up    Family Communication  : None  Disposition Plan  : Home    Consults  : None  Discharge Instructions   Allergies as of 04/07/2019      Reactions   Penicillins Other (See Comments)   Syncope  Has patient had a PCN reaction causing immediate rash, facial/tongue/throat swelling, SOB or lightheadedness with hypotension: No Has patient had a PCN reaction causing severe rash involving mucus membranes or skin necrosis: No Has patient had a PCN reaction that required hospitalization No Has patient had a PCN reaction occurring within the last 10 years: No If all of the above answers are "NO", then may proceed with Cephalosporin use.      Medication List    STOP taking these medications   diclofenac sodium 1 % Gel Commonly known as: Voltaren   hydrochlorothiazide 50 MG tablet Commonly known as:  HYDRODIURIL   pantoprazole 40 MG tablet Commonly known as: Protonix     TAKE these medications   amLODipine 10 MG tablet Commonly known as: NORVASC Take 1 tablet (10 mg total) by mouth daily.   aspirin 81 MG chewable tablet Chew 1 tablet by mouth daily.   famotidine 20 MG tablet Commonly known as: PEPCID Take 1 tablet (20 mg total) by mouth 2 (two) times daily.    gabapentin 300 MG capsule Commonly known as: NEURONTIN Take 1 capsule (300 mg total) by mouth at bedtime. PLEASE DISREGARD PREVIOUS PRESCRIPTION FOR THAT WAS FOR 30 TABLETS   insulin regular 100 units/mL injection Commonly known as: NovoLIN R ReliOn Inject 0.04-0.12 mLs (4-12 Units total) into the skin 3 (three) times daily before meals. According to sliding scale   lovastatin 40 MG tablet Commonly known as: MEVACOR Take 1 tablet (40 mg total) by mouth at bedtime.   Nicholas Weiss FlexTouch 100 UNIT/ML Sopn FlexTouch Pen Generic drug: insulin degludec 50 units once a day, increase as directed to keep fasting glucose <120 What changed: additional instructions      Follow-up Information    Nicholas Sons, MD Follow up in 1 week(s).   Specialty: Family Medicine Contact information: [address] [place] [address] [phone]          Allergies  Allergen Reactions  . Penicillins Other (See Comments)    Syncope  Has patient had a PCN reaction causing immediate rash, facial/tongue/throat swelling, SOB or lightheadedness with hypotension: No Has patient had a PCN reaction causing severe rash involving mucus membranes or skin necrosis: No Has patient had a PCN reaction that required hospitalization No Has patient had a PCN reaction occurring within the last 10 years: No If all of the above answers are "NO", then may proceed with Cephalosporin use.     Procedures/Studies: DG Chest 2 View  Result Date: 04/06/2019 CLINICAL DATA:  Follow-up with nipple marker. EXAM: CHEST - 2 VIEW COMPARISON:  2021.  10/17/2018. FINDINGS: Mediastinum hilar structures normal. No clear-cut nodule noted on today's exam. Nipple shadows are noted. No acute infiltrate. No pleural effusion or pneumothorax. Heart size normal. IMPRESSION: No clearcut nodule noted on today's exam. No acute cardiopulmonary disease. Electronically Signed   By: Nicholas Weiss  Register   On: 04/06/2019 10:25   DG Chest  Port 1 View  Result Date: 04/05/2019 CLINICAL DATA:  DKA EXAM: PORTABLE CHEST 1 VIEW COMPARISON:  October 17, 2018 FINDINGS: The heart size is normal. There is no pneumothorax or large pleural effusion. There is no focal infiltrate. There is a rounded 1.1 cm density overlying the left lower lung zone. Aortic calcifications are noted. IMPRESSION: 1. A 1.1 cm rounded density overlying the left lower lung zone. Follow-up with a nonemergent outpatient two-view chest x-ray with nipple markers is recommended. 2. Aortic atherosclerosis. 3. No acute cardiopulmonary findings. Electronically Signed   By: Constance Holster M.D.   On: 04/05/2019 20:03     Subjective: CBG stable.  Denies any symptoms. Discharge Exam: Vitals:   04/06/19 2022 04/07/19 0540  BP: (!) 150/84 (!) 175/91  Pulse: 83 77  Resp: 18 16  Temp: 98.2 F (36.8 C) 98.7 F (37.1 C)  SpO2: 100% 100%   Vitals:   04/06/19 1448 04/06/19 1508 04/06/19 2022 04/07/19 0540  BP: (!) 165/87 (!) 178/96 (!) 150/84 (!) 175/91  Pulse: 86 87 83 77  Resp: 15 17 18 16   Temp:  98.6 F (37 C) 98.2 F (36.8 C)  98.7 F (37.1 C)  TempSrc:  Oral Oral Oral  SpO2: 100% 100% 100% 100%  Weight:      Height:        General: Elderly male not in distress HEENT: Moist mucosa, supple neck Chest: Clear CVs: Normal S1-S2 GI: Soft, nondistended, nontender Musculoskeletal: Warm, no edema    The results of significant diagnostics from this hospitalization (including imaging, microbiology, ancillary and laboratory) are listed below for reference.     Microbiology: Recent Results (from the past 240 hour(s))  SARS CORONAVIRUS 2 (TAT 6-24 HRS) Nasopharyngeal Nasopharyngeal Swab     Status: None   Collection Time: 04/05/19  7:21 PM   Specimen: Nasopharyngeal Swab  Result Value Ref Range Status   SARS Coronavirus 2 NEGATIVE NEGATIVE Final    Comment: (NOTE) SARS-CoV-2 target nucleic acids are NOT DETECTED. The SARS-CoV-2 RNA is generally detectable in  upper and lower respiratory specimens during the acute phase of infection. Negative results do not preclude SARS-CoV-2 infection, do not rule out co-infections with other pathogens, and should not be used as the sole basis for treatment or other patient management decisions. Negative results must be combined with clinical observations, patient history, and epidemiological information. The expected result is Negative. Fact Sheet for Patients: SugarRoll.be Fact Sheet for Healthcare Providers: https://www.woods-mathews.com/ This test is not yet approved or cleared by the [place] FDA and  has been authorized for detection and/or diagnosis of SARS-CoV-2 by FDA under an Emergency Use Authorization (EUA). This EUA will remain  in effect (meaning this test can be used) for the duration of the COVID-19 declaration under Section 56 4(b)(1) of the Act, 21 U.S.C. section 360bbb-3(b)(1), unless the authorization is terminated or revoked sooner. Performed at [facility], [place] [address], [place], [place] [address]      Labs: BNP (last 3 results) Recent Labs    08/02/18 1042  BNP 242.3*   Basic Metabolic Panel: Recent Labs  Lab 04/05/19 2346 04/06/19 0318 04/06/19 0750 04/06/19 1345 04/07/19 0521  NA 139 141 142 141 139  K 3.9 3.5 3.7 4.0 3.7  CL 107 110 111 108 108  CO2 19* 21* 22 20* 23  GLUCOSE 454* 229* 140* 227* 95  BUN 48* 46* 47* 45* 28*  CREATININE 2.56* 2.39* 2.06* 2.18* 1.49*  CALCIUM 8.4* 8.4* 8.3* 8.4* 8.2*   Liver Function Tests: Recent Labs  Lab 04/04/19 1205 04/05/19 1804  AST 15 22  ALT 11 14  ALKPHOS 65 68  BILITOT 0.6 1.4*  PROT 8.9* 9.1*  ALBUMIN 3.7 3.8   Recent Labs  Lab 04/05/19 1950  LIPASE 19   No results for input(s): AMMONIA in the last 168 hours. CBC: Recent Labs  Lab 04/04/19 1205 04/05/19 1804  WBC 4.5 6.7  HGB 10.9* 11.1*  HCT 33.2* 34.4*  MCV 91.0 92.7  PLT 213 223    Cardiac Enzymes: No results for input(s): CKTOTAL, CKMB, CKMBINDEX, TROPONINI in the last 168 hours. BNP: Invalid input(s): POCBNP CBG: Recent Labs  Lab 04/06/19 1528 04/06/19 2022 04/07/19 0011 04/07/19 0535 04/07/19 0724  GLUCAP 259* 76 73 102* 123*   D-Dimer No results for input(s): DDIMER in the last 72 hours. Hgb A1c Recent Labs    04/04/19 1022 04/05/19 1930  HGBA1C 11.7* 12.0*   Lipid Profile No results for input(s): CHOL, HDL, LDLCALC, TRIG, CHOLHDL, LDLDIRECT in the last 72 hours. Thyroid function studies No results for input(s): TSH, T4TOTAL, T3FREE, THYROIDAB in the last 72 hours.  Invalid  input(s): FREET3 Anemia work up No results for input(s): VITAMINB12, FOLATE, FERRITIN, TIBC, IRON, RETICCTPCT in the last 72 hours. Urinalysis    Component Value Date/Time   COLORURINE STRAW (A) 04/05/2019 1804   APPEARANCEUR CLEAR (A) 04/05/2019 1804   LABSPEC 1.021 04/05/2019 1804   PHURINE 5.0 04/05/2019 1804   GLUCOSEU >=500 (A) 04/05/2019 1804   HGBUR NEGATIVE 04/05/2019 1804   BILIRUBINUR NEGATIVE 04/05/2019 1804   KETONESUR 20 (A) 04/05/2019 1804   PROTEINUR NEGATIVE 04/05/2019 1804   NITRITE NEGATIVE 04/05/2019 1804   LEUKOCYTESUR NEGATIVE 04/05/2019 1804   Sepsis Labs Invalid input(s): PROCALCITONIN,  WBC,  LACTICIDVEN Microbiology Recent Results (from the past 240 hour(s))  SARS CORONAVIRUS 2 (TAT 6-24 HRS) Nasopharyngeal Nasopharyngeal Swab     Status: None   Collection Time: 04/05/19  7:21 PM   Specimen: Nasopharyngeal Swab  Result Value Ref Range Status   SARS Coronavirus 2 NEGATIVE NEGATIVE Final    Comment: (NOTE) SARS-CoV-2 target nucleic acids are NOT DETECTED. The SARS-CoV-2 RNA is generally detectable in upper and lower respiratory specimens during the acute phase of infection. Negative results do not preclude SARS-CoV-2 infection, do not rule out co-infections with other pathogens, and should not be used as the sole basis for treatment  or other patient management decisions. Negative results must be combined with clinical observations, patient history, and epidemiological information. The expected result is Negative. Fact Sheet for Patients: SugarRoll.be Fact Sheet for Healthcare Providers: https://www.woods-mathews.com/ This test is not yet approved or cleared by the [place] FDA and  has been authorized for detection and/or diagnosis of SARS-CoV-2 by FDA under an Emergency Use Authorization (EUA). This EUA will remain  in effect (meaning this test can be used) for the duration of the COVID-19 declaration under Section 56 4(b)(1) of the Act, 21 U.S.C. section 360bbb-3(b)(1), unless the authorization is terminated or revoked sooner. Performed at [facility], [place] [address], [place], [place] [address]      Time coordinating discharge: 35 minutes  SIGNED:   Louellen Molder, MD  [facility] 04/07/2019, 9:25 AM Pager   If 7PM-7AM, please contact night-coverage [email or web address] Password [email or web address]

## 2019-04-07 NOTE — Care Management Important Message (Signed)
Important Message  Patient Details  Name: Nicholas Weiss MRN: [id number] Date of Birth: 01-18-1951   Medicare Important Message Given:  N/A - LOS <3 / Initial given by admissions  Initial Medicare IM given by Patient Access Associate on 04/07/2019 at 5:44am.     [name] 04/07/2019, 8:31 AM

## 2019-04-10 ENCOUNTER — Telehealth: Payer: Self-pay

## 2019-04-10 NOTE — Telephone Encounter (Signed)
HFU scheduled for 04/11/19 @ 9:40 AM.

## 2019-04-10 NOTE — Telephone Encounter (Signed)
Transition Care Management Follow-up Telephone Call  Date of discharge and from where: [facility] on 04/07/19  How have you been since you were released from the hospital? Doing good, blood sugar has been running around 105 in the AM and around 140-185 during the day. This AM it was 165 and pt took the [name]. Pt declined fever, pain, weakness, SOB or n/v/d since returning home.   Any questions or concerns? No   Items Reviewed:  Did the pt receive and understand the discharge instructions provided? Yes   Medications obtained and verified? Yes   Any new allergies since your discharge? No   Dietary orders reviewed? Yes  Do you have support at home? Yes   Other (ie: DME, Home Health, etc) N/A  Functional Questionnaire: (I = Independent and D = Dependent)  Bathing/Dressing- I   Meal Prep- I  Eating- I  Maintaining continence- I  Transferring/Ambulation- I  Managing Meds- I   Follow up appointments reviewed:    PCP Hospital f/u appt confirmed? Yes  Scheduled to see Dr Caryn Section on 04/11/19 @ 9:40 AM.  [facility] f/u appt confirmed? N/A   Are transportation arrangements needed? No   If their condition worsens, is the pt aware to call  their PCP or go to the ED? Yes  Was the patient provided with contact information for the PCP's office or ED? Yes  Was the pt encouraged to call back with questions or concerns? Yes

## 2019-04-11 ENCOUNTER — Other Ambulatory Visit: Payer: Self-pay

## 2019-04-11 ENCOUNTER — Encounter: Payer: Self-pay | Admitting: Family Medicine

## 2019-04-11 ENCOUNTER — Ambulatory Visit (INDEPENDENT_AMBULATORY_CARE_PROVIDER_SITE_OTHER): Payer: Medicare HMO | Admitting: Family Medicine

## 2019-04-11 VITALS — BP 140/72 | HR 88 | Temp 96.6°F | Resp 16 | Wt 172.0 lb

## 2019-04-11 DIAGNOSIS — E1165 Type 2 diabetes mellitus with hyperglycemia: Secondary | ICD-10-CM

## 2019-04-11 DIAGNOSIS — E1110 Type 2 diabetes mellitus with ketoacidosis without coma: Secondary | ICD-10-CM | POA: Diagnosis not present

## 2019-04-11 DIAGNOSIS — I10 Essential (primary) hypertension: Secondary | ICD-10-CM

## 2019-04-11 DIAGNOSIS — N179 Acute kidney failure, unspecified: Secondary | ICD-10-CM

## 2019-04-11 NOTE — Patient Instructions (Signed)
.   Please review the attached list of medications and notify my office if there are any errors.   . Please bring all of your medications to every appointment so we can make sure that our medication list is the same as yours.   

## 2019-04-11 NOTE — Progress Notes (Signed)
Patient: Nicholas Weiss Male    DOB: 05-01-1950   69 y.o.   MRN: [id number] Visit Date: 04/11/2019  Today's Provider: Lelon Huh, MD   Chief Complaint  Patient presents with  . Hospitalization Follow-up   Subjective:     HPI  Follow up Hospitalization  Patient was admitted to [facility] on 04/05/2019 and discharged on 04/07/2019. He was treated for Diabetic Ketoacidosis, hypertensive urgency, AKI, and CKD. Treatment for this included giving aggressive IV hydration, and placing on insulin drip.He was discharged on his new dose of Tresiba (50 units) along with premeal aspart that he takes. Patient was advised to follow up with PCP in 1-2 weeks. Telephone follow up was done on 04/10/2019 He reports good compliance with treatment. He reports this condition is Improved. His blood sugars this morning was 88.   ------------------------------------------------------------------------------------    Allergies  Allergen Reactions  . Penicillins Other (See Comments)    Syncope  Has patient had a PCN reaction causing immediate rash, facial/tongue/throat swelling, SOB or lightheadedness with hypotension: No Has patient had a PCN reaction causing severe rash involving mucus membranes or skin necrosis: No Has patient had a PCN reaction that required hospitalization No Has patient had a PCN reaction occurring within the last 10 years: No If all of the above answers are "NO", then may proceed with Cephalosporin use.      Current Outpatient Medications:  .  amLODipine (NORVASC) 10 MG tablet, Take 1 tablet (10 mg total) by mouth daily., Disp: 30 tablet, Rfl: 1 .  aspirin 81 MG chewable tablet, Chew 1 tablet by mouth daily., Disp: , Rfl:  .  gabapentin (NEURONTIN) 300 MG capsule, Take 1 capsule (300 mg total) by mouth at bedtime. PLEASE DISREGARD PREVIOUS PRESCRIPTION FOR THAT WAS FOR 30 TABLETS, Disp: 90 capsule, Rfl: 3 .  insulin degludec (TRESIBA FLEXTOUCH) 100 UNIT/ML SOPN  FlexTouch Pen, 50 units once a day, increase as directed to keep fasting glucose <120, Disp: 5 pen, Rfl: 12 .  insulin regular (NOVOLIN R RELION) 100 units/mL injection, Inject 0.04-0.12 mLs (4-12 Units total) into the skin 3 (three) times daily before meals. According to sliding scale, Disp: 10 mL, Rfl: 11 .  lovastatin (MEVACOR) 40 MG tablet, Take 1 tablet (40 mg total) by mouth at bedtime., Disp: 90 tablet, Rfl: 4  Review of Systems  Constitutional: Negative for appetite change, chills and fever.  Respiratory: Negative for chest tightness, shortness of breath and wheezing.   Cardiovascular: Negative for chest pain and palpitations.  Gastrointestinal: Negative for abdominal pain, nausea and vomiting.    Social History   Tobacco Use  . Smoking status: Never Smoker  . Smokeless tobacco: Never Used  Substance Use Topics  . Alcohol use: Yes    Alcohol/week: 1.0 standard drinks    Types: 1 Cans of beer per week    Comment: weekly      Objective:   BP 140/72 (BP Location: Right Arm, Cuff Size: Normal)   Pulse 88   Temp (!) 96.6 F (35.9 C) (Temporal)   Resp 16   Wt 172 lb (78 kg)   SpO2 97% Comment: room air  BMI 24.68 kg/m  Vitals:   04/11/19 0947 04/11/19 0949  BP: (!) 144/78 140/72  Pulse: 88   Resp: 16   Temp: (!) 96.6 F (35.9 C)   TempSrc: Temporal   SpO2: 97%   Weight: 172 lb (78 kg)   Body mass index is 24.68 kg/m.  Physical Exam   General Appearance:    Well developed, well nourished male in no acute distress  Eyes:    PERRL, conjunctiva/corneas clear, EOM's intact       Lungs:     Clear to auscultation bilaterally, respirations unlabored  Heart:    Normal heart rate. Normal rhythm. No murmurs, rubs, or gallops.   MS:   All extremities are intact.   Neurologic:   Awake, alert, oriented x 3. No apparent focal neurological           defect.            Assessment & Plan    1. Diabetic ketoacidosis without coma associated with type 2 diabetes mellitus  ([place]) Secondary to poor compliance with insulin regiment. Much better since hospitalization for IV rehydration and now taking insulin consistently. Stressed importance of taking basal insulin every day and regular insulin prior to mealss.  - Renal Function Panel  2. Essential (primary) hypertension Improved since discharge.   3. AKI (acute kidney injury) ([place]) Check renal panel today.  4. Uncontrolled type 2 diabetes mellitus with hyperglycemia ([place])  - C-peptide - Renal Function Panel  Future Appointments  Date Time Provider [place]  05/10/2019  9:40 AM Caryn Section, Kirstie Peri, MD [facility]    The entirety of the information documented in the History of Present Illness, Review of Systems and Physical Exam were personally obtained by me. Portions of this information were initially documented by Meyer Cory, CMA and reviewed by me for thoroughness and accuracy.       Lelon Huh, MD  [facility]

## 2019-04-12 ENCOUNTER — Encounter: Payer: Self-pay | Admitting: Family Medicine

## 2019-04-12 ENCOUNTER — Telehealth: Payer: Self-pay

## 2019-04-12 LAB — RENAL FUNCTION PANEL
Albumin: 3.6 g/dL — ABNORMAL LOW (ref 3.8–4.8)
BUN/Creatinine Ratio: 16 (ref 10–24)
BUN: 20 mg/dL (ref 8–27)
CO2: 24 mmol/L (ref 20–29)
Calcium: 8.7 mg/dL (ref 8.6–10.2)
Chloride: 101 mmol/L (ref 96–106)
Creatinine, Ser: 1.27 mg/dL (ref 0.76–1.27)
GFR calc Af Amer: 68 mL/min/{1.73_m2} (ref >59)
GFR calc non Af Amer: 58 mL/min/{1.73_m2} — ABNORMAL LOW (ref >59)
Glucose: 69 mg/dL (ref 65–99)
Phosphorus: 3.9 mg/dL (ref 2.8–4.1)
Potassium: 4.8 mmol/L (ref 3.5–5.2)
Sodium: 137 mmol/L (ref 134–144)

## 2019-04-12 LAB — C-PEPTIDE: C-Peptide: <0.1 ng/mL — ABNORMAL LOW (ref 1.1–4.4)

## 2019-04-12 NOTE — Telephone Encounter (Signed)
Attempted to contact patient, no answer or voicemail. Okay for [facility] to advise patient.  

## 2019-04-12 NOTE — Telephone Encounter (Signed)
-----   Message from Birdie Sons, MD sent at 04/12/2019 10:05 AM EST ----- Kidney functions are back to normal. Needs to take his insulin religiously every day and follow up in February as scheduled.

## 2019-04-12 NOTE — Telephone Encounter (Signed)
Patient advised and verbally voiced understanding.  

## 2019-04-22 ENCOUNTER — Emergency Department
Admission: EM | Admit: 2019-04-22 | Discharge: 2019-04-22 | Disposition: A | Discharge status: Home / Self Care | Payer: Medicare HMO | Attending: Student | Admitting: Student

## 2019-04-22 ENCOUNTER — Emergency Department: Payer: Medicare HMO

## 2019-04-22 ENCOUNTER — Other Ambulatory Visit: Payer: Self-pay

## 2019-04-22 DIAGNOSIS — E1022 Type 1 diabetes mellitus with diabetic chronic kidney disease: Secondary | ICD-10-CM | POA: Diagnosis not present

## 2019-04-22 DIAGNOSIS — E10649 Type 1 diabetes mellitus with hypoglycemia without coma: Secondary | ICD-10-CM | POA: Diagnosis not present

## 2019-04-22 DIAGNOSIS — R531 Weakness: Secondary | ICD-10-CM | POA: Insufficient documentation

## 2019-04-22 DIAGNOSIS — D649 Anemia, unspecified: Secondary | ICD-10-CM

## 2019-04-22 DIAGNOSIS — E11649 Type 2 diabetes mellitus with hypoglycemia without coma: Secondary | ICD-10-CM | POA: Diagnosis not present

## 2019-04-22 DIAGNOSIS — Z794 Long term (current) use of insulin: Secondary | ICD-10-CM | POA: Insufficient documentation

## 2019-04-22 DIAGNOSIS — E162 Hypoglycemia, unspecified: Secondary | ICD-10-CM | POA: Diagnosis not present

## 2019-04-22 DIAGNOSIS — I129 Hypertensive chronic kidney disease with stage 1 through stage 4 chronic kidney disease, or unspecified chronic kidney disease: Secondary | ICD-10-CM | POA: Insufficient documentation

## 2019-04-22 DIAGNOSIS — N1831 Chronic kidney disease, stage 3a: Secondary | ICD-10-CM | POA: Diagnosis not present

## 2019-04-22 DIAGNOSIS — E161 Other hypoglycemia: Secondary | ICD-10-CM | POA: Diagnosis not present

## 2019-04-22 DIAGNOSIS — Z79899 Other long term (current) drug therapy: Secondary | ICD-10-CM | POA: Diagnosis not present

## 2019-04-22 DIAGNOSIS — R404 Transient alteration of awareness: Secondary | ICD-10-CM | POA: Diagnosis not present

## 2019-04-22 DIAGNOSIS — R29810 Facial weakness: Secondary | ICD-10-CM | POA: Diagnosis not present

## 2019-04-22 DIAGNOSIS — I10 Essential (primary) hypertension: Secondary | ICD-10-CM | POA: Diagnosis not present

## 2019-04-22 LAB — URINALYSIS, COMPLETE (UACMP) WITH MICROSCOPIC
Bacteria, UA: NONE SEEN
Bilirubin Urine: NEGATIVE
Glucose, UA: 150 mg/dL — AB
Hgb urine dipstick: NEGATIVE
Ketones, ur: NEGATIVE mg/dL
Leukocytes,Ua: NEGATIVE
Nitrite: NEGATIVE
Protein, ur: NEGATIVE mg/dL
Specific Gravity, Urine: 1.009 (ref 1.005–1.030)
Squamous Epithelial / HPF: NONE SEEN (ref 0–5)
pH: 6 (ref 5.0–8.0)

## 2019-04-22 LAB — BASIC METABOLIC PANEL
Anion gap: 9 (ref 5–15)
BUN: 18 mg/dL (ref 8–23)
CO2: 27 mmol/L (ref 22–32)
Calcium: 8.8 mg/dL — ABNORMAL LOW (ref 8.9–10.3)
Chloride: 105 mmol/L (ref 98–111)
Creatinine, Ser: 1.44 mg/dL — ABNORMAL HIGH (ref 0.61–1.24)
GFR calc Af Amer: 58 mL/min — ABNORMAL LOW (ref >60)
GFR calc non Af Amer: 50 mL/min — ABNORMAL LOW (ref >60)
Glucose, Bld: 108 mg/dL — ABNORMAL HIGH (ref 70–99)
Potassium: 4.1 mmol/L (ref 3.5–5.1)
Sodium: 141 mmol/L (ref 135–145)

## 2019-04-22 LAB — CBC
HCT: 30.6 % — ABNORMAL LOW (ref 39.0–52.0)
Hemoglobin: 9.7 g/dL — ABNORMAL LOW (ref 13.0–17.0)
MCH: 30 pg (ref 26.0–34.0)
MCHC: 31.7 g/dL (ref 30.0–36.0)
MCV: 94.7 fL (ref 80.0–100.0)
Platelets: 356 10*3/uL (ref 150–400)
RBC: 3.23 MIL/uL — ABNORMAL LOW (ref 4.22–5.81)
RDW: 13.3 % (ref 11.5–15.5)
WBC: 5.1 10*3/uL (ref 4.0–10.5)
nRBC: 0 % (ref 0.0–0.2)

## 2019-04-22 LAB — GLUCOSE, CAPILLARY: Glucose-Capillary: 135 mg/dL — ABNORMAL HIGH (ref 70–99)

## 2019-04-22 IMAGING — CT CT HEAD W/O CM
3 series · 15 of 46 positions shown, 18 images · non-contrast
Comparison: [DATE]

CLINICAL DATA: Generalized weakness.

EXAM:
CT HEAD WITHOUT CONTRAST
TECHNIQUE: Contiguous axial images were obtained from the base of the skull
through the vertex without intravenous contrast.

[Series 3: head wo · axial · 0.39mm/px · z∈[-116,+4]mm · 9 of 29 slices shown, 12 images]
[im 3/29  brain]
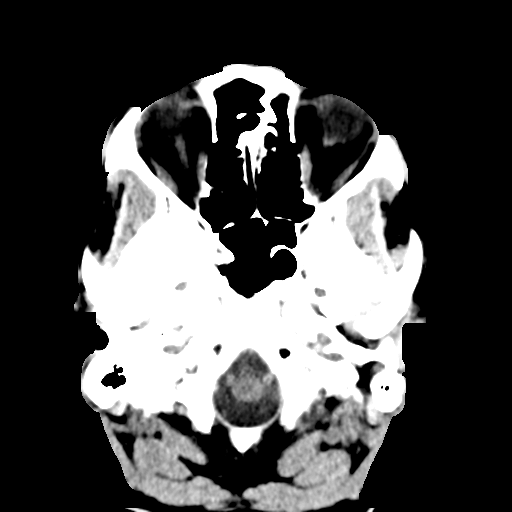
[im 3/29  bone]
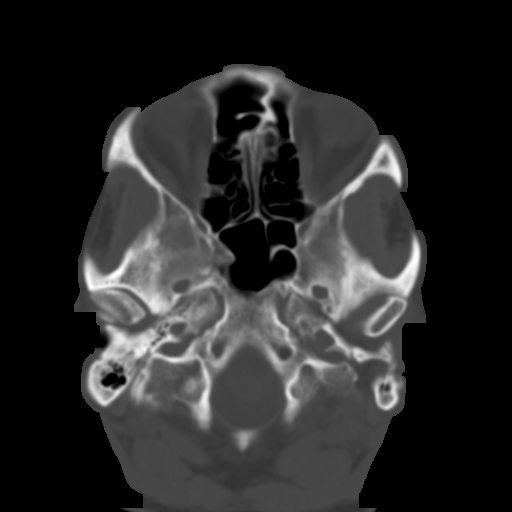
[im 6/29  brain]
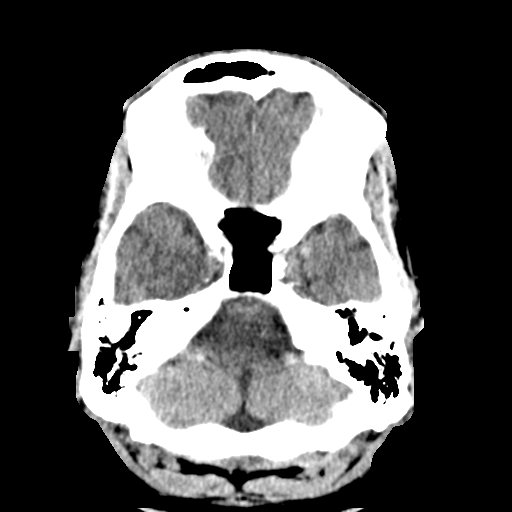
[im 9/29  brain]
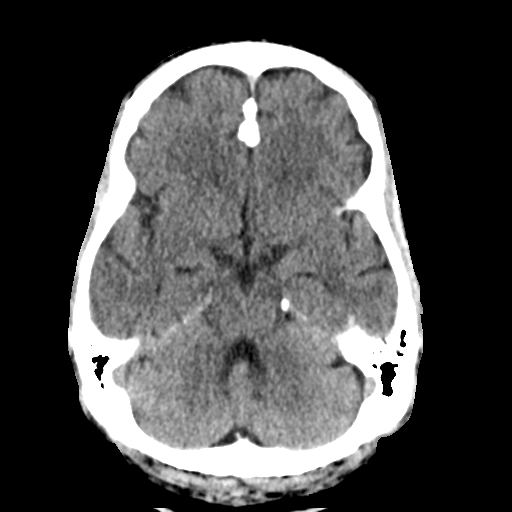
[im 12/29  brain]
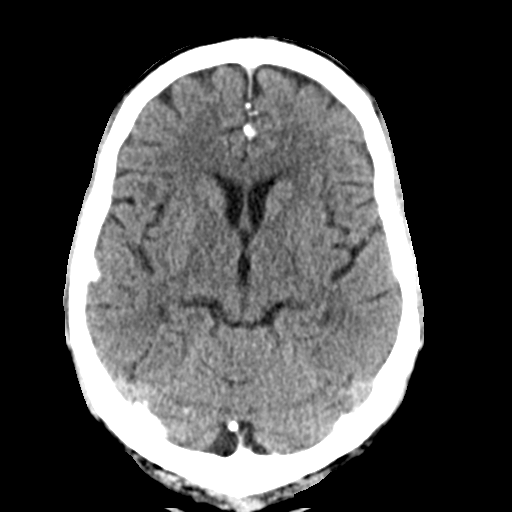
[im 15/29  brain]
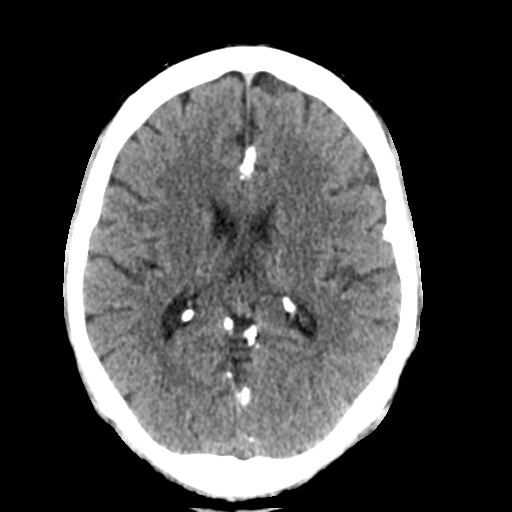
[im 15/29  bone]
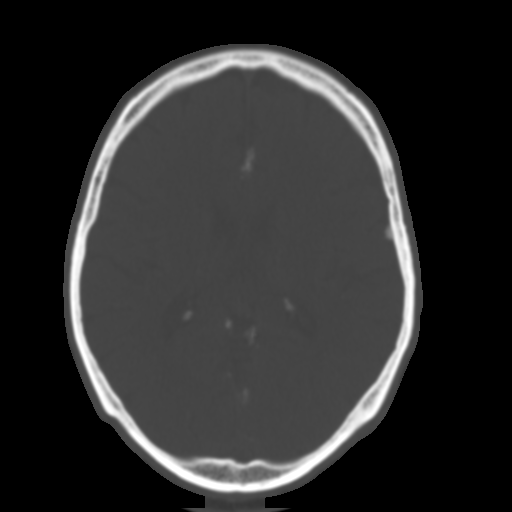
[im 18/29  brain]
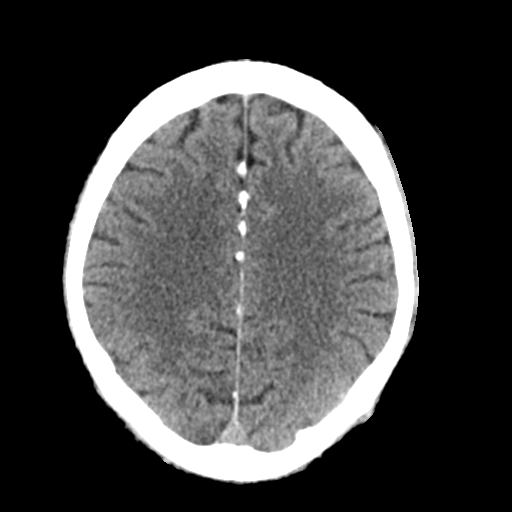
[im 21/29  brain]
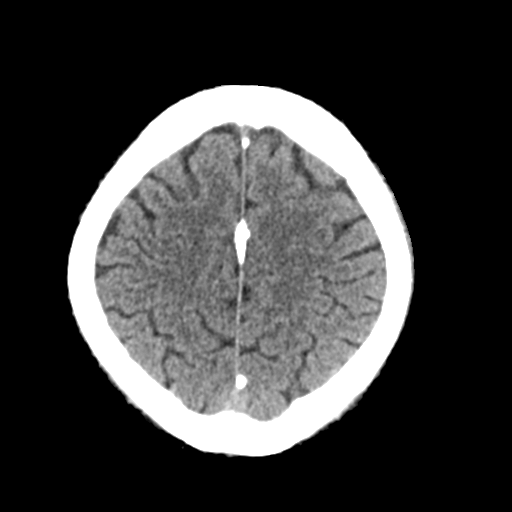
[im 24/29  brain]
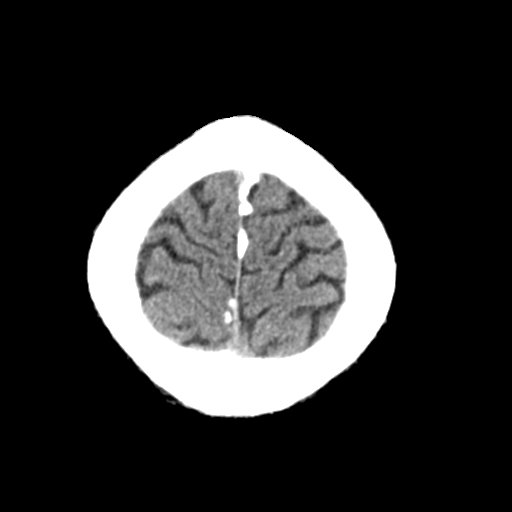
[im 27/29  brain]
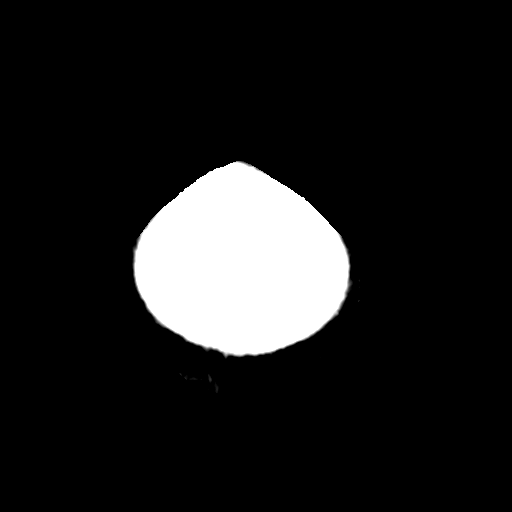
[im 27/29  bone]
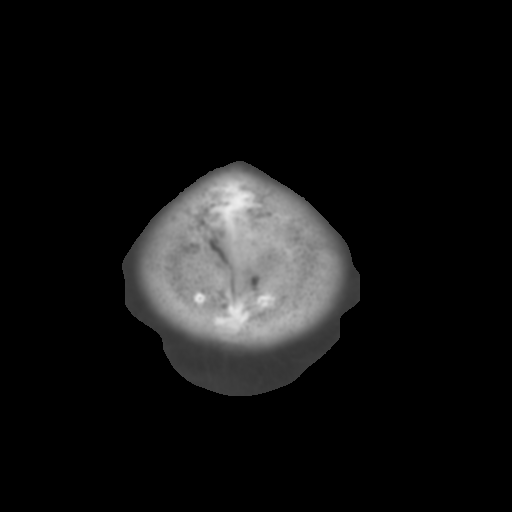

[Series 4: coronal soft tissue · coronal · 0.32mm/px · 3 of 67 slices shown]
[im 25/67  brain]
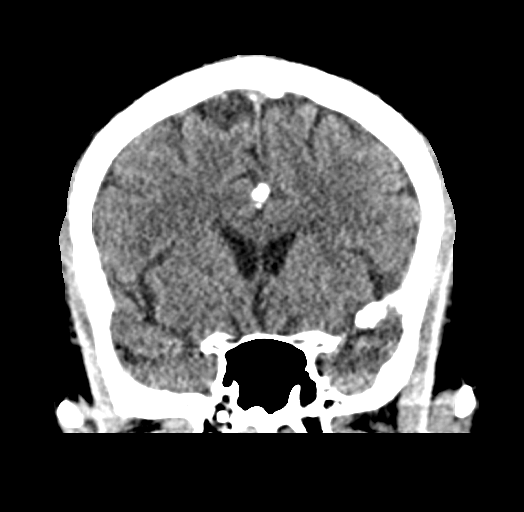
[im 31/67  brain]
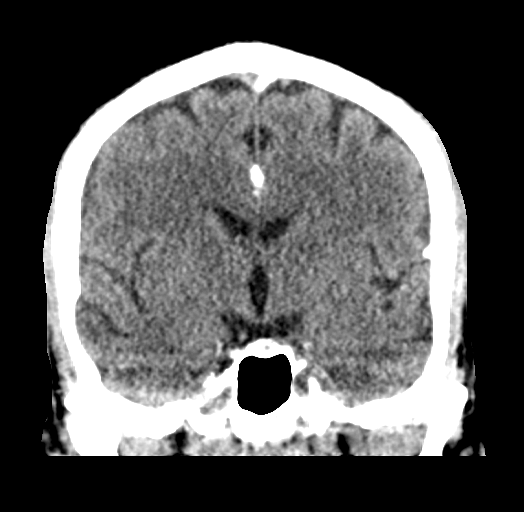
[im 37/67  brain]
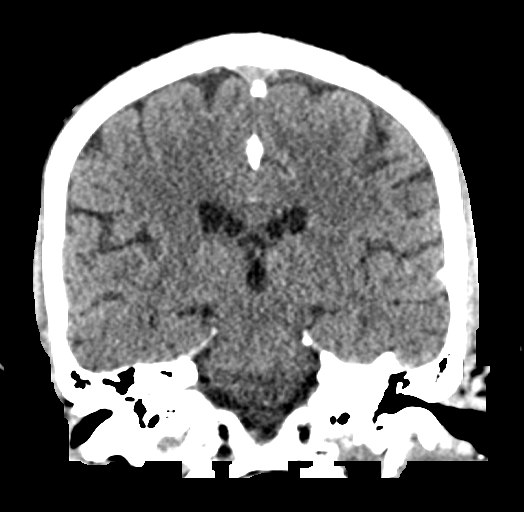

[Series 5: sagittal soft tissue · sagittal · 0.29mm/px · 3 of 53 slices shown]
[im 18/53  brain]
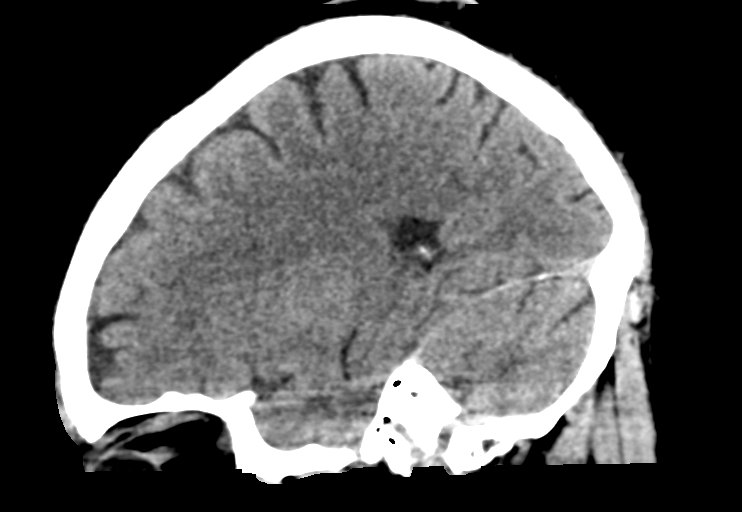
[im 27/53  brain]
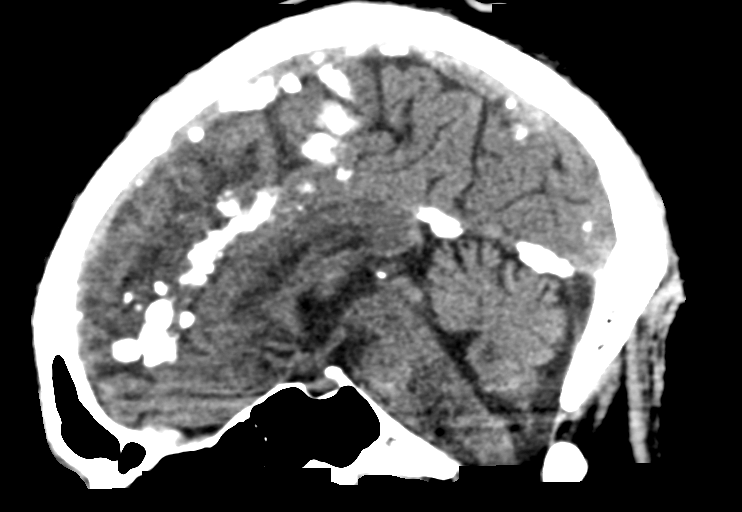
[im 35/53  brain]
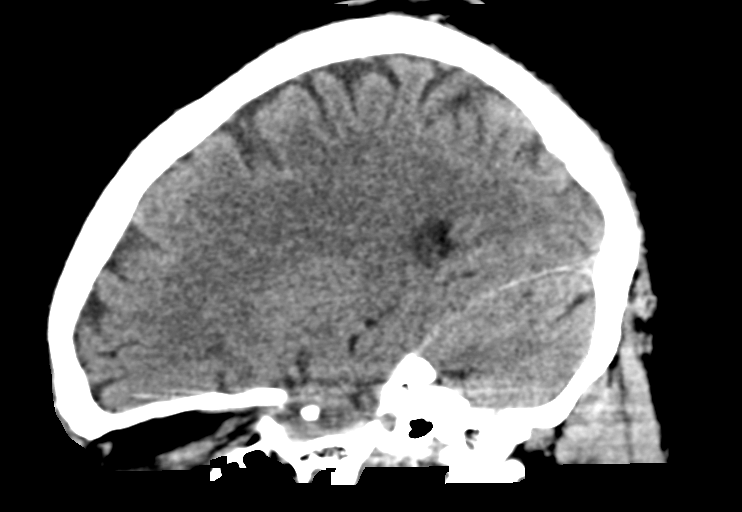

[15 of 46 positions shown; findings below may reference images not displayed]

FINDINGS: Brain: There is no evidence for acute hemorrhage, hydrocephalus,
mass lesion, or abnormal extra-axial fluid collection. No definite
CT evidence for acute infarction.

Vascular: No hyperdense vessel or unexpected calcification.

Skull: Normal. Negative for fracture or focal lesion.

Sinuses/Orbits: No acute finding.

Other: None.
IMPRESSION: No acute intracranial abnormality.

## 2019-04-22 NOTE — ED Provider Notes (Signed)
[facility] Emergency Department Provider Note  ____________________________________________   First MD Initiated Contact with Patient 04/22/19 [phone]     (approximate)  I have reviewed the triage vital signs and the nursing notes.  History  Chief Complaint Weakness    HPI Nicholas Weiss is a 69 y.o. male with history of HLD, HTN, DM1 who presents to the emergency department from his group home for seemingly sudden generalized weakness, found to be hypoglycemic to 67 by facility, CBG with EMS 72. Ate small breakfast.  On arrival to the ED point-of-care glucose 135.  Patient states he did administer too much of his sliding scale insulin (regular insulin) last night, for unclear reasons.  He states based on his blood sugar he should have given himself 6 units, but instead gave himself 8.  He reports taking his long-acting appropriately.  He only ate a small breakfast this morning when he began feeling generally weak, as above.  He denies any lateralizing weakness, no numbness, no facial droop, no speech difficulties.  No recent illnesses.  Denies any fevers, chills, cough, difficulty breathing, nausea, vomiting, diarrhea, urinary symptoms.   Past Medical Hx Past Medical History:  Diagnosis Date  . DKA (diabetic ketoacidoses) ([place]) 04/06/2016  . Hypercholesteremia   . Hypertension     Problem List Patient Active Problem List   Diagnosis Date Noted  . Lung nodule 04/07/2019  . Uncontrolled type 1 diabetes mellitus ([place])   . Acute renal failure with acute tubular necrosis superimposed on stage 3a chronic kidney disease ([place])   . Hypertensive urgency 04/05/2019  . AKI (acute kidney injury) ([place]) 04/05/2019  . CKD (chronic kidney disease) stage 3, GFR 30-59 ml/min 04/05/2019  . DKA (diabetic ketoacidoses) ([place]) 04/06/2016  . Hypercholesterolemia 01/24/2016  . Essential (primary) hypertension 06/07/2006  . Type 1 diabetes mellitus with diabetic neuropathy,  unspecified ([place]) 05/31/2006    Past Surgical Hx Past Surgical History:  Procedure Laterality Date  . KNEE SURGERY Right    Torn meniscus  . KNEE SURGERY Left     Medications Prior to Admission medications   Medication Sig Start Date End Date Taking? Authorizing Provider  amLODipine (NORVASC) 10 MG tablet Take 1 tablet (10 mg total) by mouth daily. 04/07/19   Dhungel, Flonnie Overman, MD  aspirin 81 MG chewable tablet Chew 1 tablet by mouth daily.    [provider]  gabapentin (NEURONTIN) 300 MG capsule Take 1 capsule (300 mg total) by mouth at bedtime. PLEASE DISREGARD PREVIOUS PRESCRIPTION FOR THAT WAS FOR 30 TABLETS 12/19/18   Birdie Sons, MD  insulin degludec (TRESIBA FLEXTOUCH) 100 UNIT/ML SOPN FlexTouch Pen 50 units once a day, increase as directed to keep fasting glucose <120 04/07/19   Dhungel, Nishant, MD  insulin regular (NOVOLIN R RELION) 100 units/mL injection Inject 0.04-0.12 mLs (4-12 Units total) into the skin 3 (three) times daily before meals. According to sliding scale 01/03/18   Birdie Sons, MD  lovastatin (MEVACOR) 40 MG tablet Take 1 tablet (40 mg total) by mouth at bedtime. 08/02/18   Birdie Sons, MD    Allergies Penicillins  Family Hx Family History  Problem Relation Age of Onset  . Heart attack Father   . Hypertension Sister   . Cancer Sister     Social Hx Social History   Tobacco Use  . Smoking status: Never Smoker  . Smokeless tobacco: Never Used  Substance Use Topics  . Alcohol use: Not Currently    Alcohol/week: 1.0  standard drinks    Types: 1 Cans of beer per week    Comment: Months since last ETOH consumptoin 04/22/2019  . Drug use: Yes    Types: Marijuana    Comment: Last used 04/01/2019     Review of Systems  Constitutional: Negative for fever, chills. + weakness, hypoglycemia Eyes: Negative for visual changes. ENT: Negative for sore throat. Cardiovascular: Negative for chest pain. Respiratory: Negative for shortness of  breath. Gastrointestinal: Negative for nausea, vomiting.  Genitourinary: Negative for dysuria. Musculoskeletal: Negative for leg swelling. Skin: Negative for rash. Neurological: Negative for for headaches.   Physical Exam  Vital Signs: ED Triage Vitals  Enc Vitals Group     BP 04/22/19 0920 (!) 150/133     Pulse Rate 04/22/19 0920 87     Resp 04/22/19 0920 14     Temp 04/22/19 0920 (!) 97.5 F (36.4 C)     Temp Source 04/22/19 0920 Oral     SpO2 04/22/19 0920 97 %     Weight 04/22/19 0927 172 lb 2.9 oz (78.1 kg)     Height 04/22/19 0927 5' 10.5" (1.791 m)     Head Circumference --      Peak Flow --      Pain Score 04/22/19 0927 0     Pain Loc --      Pain Edu? --      Excl. in [place]? --     Constitutional: Alert and oriented.  Head: Normocephalic. Atraumatic. Eyes: Conjunctivae clear. Sclera anicteric. Nose: No congestion. No rhinorrhea. Mouth/Throat: Wearing mask.  Neck: No stridor.   Cardiovascular: Normal rate, regular rhythm. Extremities well perfused. Respiratory: Normal respiratory effort.  Lungs CTAB. Gastrointestinal: Soft. Non-tender. Non-distended.  Rectal: RN chaperone present. Brown stool, slightly guaiac positive. Musculoskeletal: No lower extremity edema. No deformities. Neurologic:  Normal speech and language. No gross focal neurologic deficits are appreciated. Alert and oriented.  Face symmetric.  Tongue midline.  Cranial nerves II through XII intact. UE and LE strength 5/5 and symmetric. UE and LE SILT.  Skin: Skin is warm, dry and intact. No rash noted. Psychiatric: Mood and affect are appropriate for situation.  EKG  Personally reviewed.   Rate: 89 Rhythm: sinus Axis: normal Intervals: question prolonged PR, baseline artifact No acute ischemic changes No STEMI    Radiology  CTH: IMPRESSION:  No acute intracranial abnormality.    Procedures  Procedure(s) performed (including critical care):  Procedures   Initial Impression /  Assessment and Plan / ED Course  69 y.o. male who presents to the ED for generalized weakness, likely in the setting of hypoglycemia, related to increase insulin administration.  Ddx: symptomatic hypoglycemia, electrolyte abnormality, dehydration, UTI, amongst others. No focal weakness or other neurological symptoms, doubt central etiology.   Will obtain labs, CT imaging, EKG.  Suspect his presentation is related to his noted hypoglycemia during his weakness episode.  He does admit to administering slightly too much of his regular insulin last night, which is likely etiology of his hypoglycemia, especially in the setting of eating minimal at breakfast time.  Work up reveals mild anemia, Hgb 9.7, about ~1.5 drop from prior. Stool brown, mildly guaiac positive. No indication for transfusion, but will require outpatient GI follow up, given information, and patient voices understanding.   Otherwise, remainder of work up w/o actionable derangements. No electrolyte abnormality, no UTI, CT head negative. Has tolerated PO, ambulated, BG remained stable, and otherwise stable for discharge. Advised adherence to his medications only as  prescribed, healthy diet. Patient voices understanding and is comfortable w/ plan and discharge.   Final Clinical Impression(s) / ED Diagnosis  Final diagnoses:  Weakness  Hypoglycemia  Anemia, unspecified type       Note:  This document was prepared using Dragon voice recognition software and may include unintentional dictation errors.   Lilia Pro., MD 04/22/19 [phone]

## 2019-04-22 NOTE — ED Notes (Signed)
This RN called pt contact Nicholas Weiss at [phone]. Nicholas Weiss stated that she will send someone to come pick pt up.

## 2019-04-22 NOTE — Discharge Instructions (Addendum)
Thank you for letting us take care of you in the emergency department today.   Please continue to take any regular, prescribed medications. Please only dose your insulin as prescribed.  Please follow up with: - Your primary care doctor to review your ER visit and follow up on your symptoms.  - GI doctor, for follow up of your anemia + needing a colonoscopy  Please return to the ER for any new or worsening symptoms.

## 2019-04-22 NOTE — ED Triage Notes (Signed)
Patient arrived via EMS from [place]. Patient is AOx4 and ambulatory, however facility called due to presenting with stroke symptoms and sudden generalized weakness. Patient was able to get out of bed but was unable to get back in bed. Patient is stroke negative, patient has had weakness since this morning. Patient has been having trouble keeping blood sugar up. CBG on arrival was 53 per facility. CBG is now 72  CBG 72 BP156/100 HR 90 20 R AC

## 2019-04-22 NOTE — ED Notes (Signed)
Patient transported to CT 

## 2019-04-22 NOTE — ED Notes (Signed)
Patient ambulated in room without complication or assistance from staff or device.

## 2019-04-24 ENCOUNTER — Telehealth: Payer: Self-pay | Admitting: Family Medicine

## 2019-04-24 DIAGNOSIS — E1040 Type 1 diabetes mellitus with diabetic neuropathy, unspecified: Secondary | ICD-10-CM

## 2019-04-24 NOTE — Telephone Encounter (Signed)
Have sent order to sarah

## 2019-04-24 NOTE — Telephone Encounter (Signed)
Patient fiance is calling to request a referral to [facility] clinic to an endocrinologist Dr. Gabriel Carina for his diabetes. Please advise [phone]

## 2019-04-24 NOTE — Telephone Encounter (Signed)
Nicholas Weiss was advised that referral was placed. Also I stated to her that someone with the referral team will probably reach out to them for appointment.

## 2019-04-25 ENCOUNTER — Other Ambulatory Visit: Payer: Self-pay

## 2019-04-25 ENCOUNTER — Encounter: Payer: Self-pay | Admitting: Emergency Medicine

## 2019-04-25 ENCOUNTER — Emergency Department
Admission: EM | Admit: 2019-04-25 | Discharge: 2019-04-25 | Disposition: A | Discharge status: Home / Self Care | Payer: Medicare HMO | Attending: Emergency Medicine | Admitting: Emergency Medicine

## 2019-04-25 DIAGNOSIS — E161 Other hypoglycemia: Secondary | ICD-10-CM | POA: Diagnosis not present

## 2019-04-25 DIAGNOSIS — E162 Hypoglycemia, unspecified: Secondary | ICD-10-CM | POA: Diagnosis not present

## 2019-04-25 DIAGNOSIS — R000 Tachycardia, unspecified: Secondary | ICD-10-CM | POA: Diagnosis not present

## 2019-04-25 DIAGNOSIS — I129 Hypertensive chronic kidney disease with stage 1 through stage 4 chronic kidney disease, or unspecified chronic kidney disease: Secondary | ICD-10-CM | POA: Insufficient documentation

## 2019-04-25 DIAGNOSIS — E11649 Type 2 diabetes mellitus with hypoglycemia without coma: Secondary | ICD-10-CM | POA: Diagnosis not present

## 2019-04-25 DIAGNOSIS — R69 Illness, unspecified: Secondary | ICD-10-CM | POA: Diagnosis not present

## 2019-04-25 DIAGNOSIS — E1040 Type 1 diabetes mellitus with diabetic neuropathy, unspecified: Secondary | ICD-10-CM | POA: Diagnosis not present

## 2019-04-25 DIAGNOSIS — Z794 Long term (current) use of insulin: Secondary | ICD-10-CM | POA: Insufficient documentation

## 2019-04-25 DIAGNOSIS — F1210 Cannabis abuse, uncomplicated: Secondary | ICD-10-CM | POA: Diagnosis not present

## 2019-04-25 DIAGNOSIS — E10649 Type 1 diabetes mellitus with hypoglycemia without coma: Secondary | ICD-10-CM | POA: Diagnosis not present

## 2019-04-25 DIAGNOSIS — N1831 Chronic kidney disease, stage 3a: Secondary | ICD-10-CM | POA: Insufficient documentation

## 2019-04-25 DIAGNOSIS — E1022 Type 1 diabetes mellitus with diabetic chronic kidney disease: Secondary | ICD-10-CM | POA: Insufficient documentation

## 2019-04-25 DIAGNOSIS — I10 Essential (primary) hypertension: Secondary | ICD-10-CM | POA: Diagnosis not present

## 2019-04-25 DIAGNOSIS — Z7982 Long term (current) use of aspirin: Secondary | ICD-10-CM | POA: Diagnosis not present

## 2019-04-25 LAB — CBC WITH DIFFERENTIAL/PLATELET
Abs Immature Granulocytes: 0.02 10*3/uL (ref 0.00–0.07)
Basophils Absolute: 0 10*3/uL (ref 0.0–0.1)
Basophils Relative: 0 %
Eosinophils Absolute: 0 10*3/uL (ref 0.0–0.5)
Eosinophils Relative: 1 %
HCT: 30.8 % — ABNORMAL LOW (ref 39.0–52.0)
Hemoglobin: 10 g/dL — ABNORMAL LOW (ref 13.0–17.0)
Immature Granulocytes: 0 %
Lymphocytes Relative: 14 %
Lymphs Abs: 0.8 10*3/uL (ref 0.7–4.0)
MCH: 30.9 pg (ref 26.0–34.0)
MCHC: 32.5 g/dL (ref 30.0–36.0)
MCV: 95.1 fL (ref 80.0–100.0)
Monocytes Absolute: 0.3 10*3/uL (ref 0.1–1.0)
Monocytes Relative: 5 %
Neutro Abs: 4.2 10*3/uL (ref 1.7–7.7)
Neutrophils Relative %: 80 %
Platelets: 332 10*3/uL (ref 150–400)
RBC: 3.24 MIL/uL — ABNORMAL LOW (ref 4.22–5.81)
RDW: 13.4 % (ref 11.5–15.5)
WBC: 5.3 10*3/uL (ref 4.0–10.5)
nRBC: 0 % (ref 0.0–0.2)

## 2019-04-25 LAB — URINALYSIS, COMPLETE (UACMP) WITH MICROSCOPIC
Bilirubin Urine: NEGATIVE
Glucose, UA: >=500 mg/dL — AB
Hgb urine dipstick: NEGATIVE
Ketones, ur: NEGATIVE mg/dL
Leukocytes,Ua: NEGATIVE
Nitrite: NEGATIVE
Protein, ur: NEGATIVE mg/dL
Specific Gravity, Urine: 1.012 (ref 1.005–1.030)
Squamous Epithelial / HPF: NONE SEEN (ref 0–5)
pH: 6 (ref 5.0–8.0)

## 2019-04-25 LAB — BASIC METABOLIC PANEL
Anion gap: 9 (ref 5–15)
BUN: 25 mg/dL — ABNORMAL HIGH (ref 8–23)
CO2: 25 mmol/L (ref 22–32)
Calcium: 8.8 mg/dL — ABNORMAL LOW (ref 8.9–10.3)
Chloride: 104 mmol/L (ref 98–111)
Creatinine, Ser: 1.66 mg/dL — ABNORMAL HIGH (ref 0.61–1.24)
GFR calc Af Amer: 49 mL/min — ABNORMAL LOW (ref >60)
GFR calc non Af Amer: 42 mL/min — ABNORMAL LOW (ref >60)
Glucose, Bld: 183 mg/dL — ABNORMAL HIGH (ref 70–99)
Potassium: 4.1 mmol/L (ref 3.5–5.1)
Sodium: 138 mmol/L (ref 135–145)

## 2019-04-25 LAB — GLUCOSE, CAPILLARY
Glucose-Capillary: 193 mg/dL — ABNORMAL HIGH (ref 70–99)
Glucose-Capillary: 350 mg/dL — ABNORMAL HIGH (ref 70–99)

## 2019-04-25 MED ORDER — SODIUM CHLORIDE 0.9 % IV BOLUS
500.0000 mL | Freq: Once | INTRAVENOUS | Status: AC
  Administered 2019-04-25: 13:00:00 500 mL via INTRAVENOUS

## 2019-04-25 NOTE — ED Provider Notes (Signed)
[facility] Emergency Department Provider Note ____________________________________________   First MD Initiated Contact with Patient 04/25/19 1105     (approximate)  I have reviewed the triage vital signs and the nursing notes.   HISTORY  Chief Complaint Hypoglycemia    HPI Nicholas Weiss is a 69 y.o. male with PMH as noted below who presents with altered mental status and hypoglycemia.  Per EMS, his coworkers found him laying on a bed with his eyes rolled back in his head and minimally responsive.  His initial blood glucose was 28, and then the patient drank some orange juice which raised the glucose up to 65.  He then got oral glucose from EMS.  The patient states he is feeling a bit weak now but otherwise well.  He reports that this happened a few days ago and he had to come to the ER.  He states also that after taking his [name] yesterday morning, his blood sugars remained low enough throughout the day that he did not require any short acting insulin.  He states that his sugar was in the 60s yesterday before he took the [name] and he ate something to bring it up to 100.  The patient states that he did not eat breakfast this morning and has had decreased appetite over the last 1 to 2 days.  Past Medical History:  Diagnosis Date  . DKA (diabetic ketoacidoses) ([place]) 04/06/2016  . Hypercholesteremia   . Hypertension     Patient Active Problem List   Diagnosis Date Noted  . Lung nodule 04/07/2019  . Uncontrolled type 1 diabetes mellitus ([place])   . Acute renal failure with acute tubular necrosis superimposed on stage 3a chronic kidney disease ([place])   . Hypertensive urgency 04/05/2019  . AKI (acute kidney injury) ([place]) 04/05/2019  . CKD (chronic kidney disease) stage 3, GFR 30-59 ml/min 04/05/2019  . DKA (diabetic ketoacidoses) ([place]) 04/06/2016  . Hypercholesterolemia 01/24/2016  . Essential (primary) hypertension 06/07/2006  . Type 1 diabetes mellitus  with diabetic neuropathy, unspecified ([place]) 05/31/2006    Past Surgical History:  Procedure Laterality Date  . KNEE SURGERY Right    Torn meniscus  . KNEE SURGERY Left     Prior to Admission medications   Medication Sig Start Date End Date Taking? Authorizing Provider  amLODipine (NORVASC) 10 MG tablet Take 1 tablet (10 mg total) by mouth daily. 04/07/19   Dhungel, Flonnie Overman, MD  aspirin 81 MG chewable tablet Chew 1 tablet by mouth daily.    [provider]  gabapentin (NEURONTIN) 300 MG capsule Take 1 capsule (300 mg total) by mouth at bedtime. PLEASE DISREGARD PREVIOUS PRESCRIPTION FOR THAT WAS FOR 30 TABLETS 12/19/18   Birdie Sons, MD  insulin degludec (TRESIBA FLEXTOUCH) 100 UNIT/ML SOPN FlexTouch Pen 50 units once a day, increase as directed to keep fasting glucose <120 04/07/19   Dhungel, Nishant, MD  insulin regular (NOVOLIN R RELION) 100 units/mL injection Inject 0.04-0.12 mLs (4-12 Units total) into the skin 3 (three) times daily before meals. According to sliding scale 01/03/18   Birdie Sons, MD  lovastatin (MEVACOR) 40 MG tablet Take 1 tablet (40 mg total) by mouth at bedtime. 08/02/18   Birdie Sons, MD    Allergies Penicillins  Family History  Problem Relation Age of Onset  . Heart attack Father   . Hypertension Sister   . Cancer Sister     Social History Social History   Tobacco Use  . Smoking  status: Never Smoker  . Smokeless tobacco: Never Used  Substance Use Topics  . Alcohol use: Not Currently    Alcohol/week: 1.0 standard drinks    Types: 1 Cans of beer per week    Comment: Months since last ETOH consumptoin 04/22/2019  . Drug use: Yes    Types: Marijuana    Comment: Last used 04/01/2019    Review of Systems  Constitutional: No fever. Eyes: No redness. ENT: No sore throat. Cardiovascular: Denies chest pain. Respiratory: Denies shortness of breath. Gastrointestinal: No vomiting. Genitourinary: Negative for dysuria.  Musculoskeletal:  Negative for back pain. Skin: Negative for rash. Neurological: Negative for headache.   ____________________________________________   PHYSICAL EXAM:  VITAL SIGNS: ED Triage Vitals [04/25/19 1108]  Enc Vitals Group     BP (!) 158/97     Pulse Rate 93     Resp 15     Temp      Temp src      SpO2 100 %     Weight 168 lb (76.2 kg)     Height 5\' 11"  (1.803 m)     Head Circumference      Peak Flow      Pain Score 0     Pain Loc      Pain Edu?      Excl. in [place]?     Constitutional: Alert and oriented.  Relatively well appearing and in no acute distress. Eyes: Conjunctivae are normal.  EOMI. Head: Atraumatic. Nose: No congestion/rhinnorhea. Mouth/Throat: Mucous membranes are moist.   Neck: Normal range of motion.  Cardiovascular: Normal rate, regular rhythm.  Good peripheral circulation. Respiratory: Normal respiratory effort.  No retractions. Gastrointestinal: No distention.  Musculoskeletal: Extremities warm and well perfused.  Neurologic:  Normal speech and language.  Motor intact in all extremities.  No gross focal neurologic deficits are appreciated.  Skin:  Skin is warm and dry. No rash noted. Psychiatric: Mood and affect are normal. Speech and behavior are normal.  ____________________________________________   LABS (all labs ordered are listed, but only abnormal results are displayed)  Labs Reviewed  GLUCOSE, CAPILLARY - Abnormal; Notable for the following components:      Result Value   Glucose-Capillary 193 (*)    All other components within normal limits  BASIC METABOLIC PANEL - Abnormal; Notable for the following components:   Glucose, Bld 183 (*)    BUN 25 (*)    Creatinine, Ser 1.66 (*)    Calcium 8.8 (*)    GFR calc non Af Amer 42 (*)    GFR calc Af Amer 49 (*)    All other components within normal limits  CBC WITH DIFFERENTIAL/PLATELET - Abnormal; Notable for the following components:   RBC 3.24 (*)    Hemoglobin 10.0 (*)    HCT 30.8 (*)    All  other components within normal limits  GLUCOSE, CAPILLARY - Abnormal; Notable for the following components:   Glucose-Capillary 350 (*)    All other components within normal limits  URINALYSIS, COMPLETE (UACMP) WITH MICROSCOPIC   ____________________________________________  EKG   ____________________________________________  RADIOLOGY    ____________________________________________   PROCEDURES  Procedure(s) performed: No  Procedures  Critical Care performed: No ____________________________________________   INITIAL IMPRESSION / ASSESSMENT AND PLAN / ED COURSE  Pertinent labs & imaging results that were available during my care of the patient were reviewed by me and considered in my medical decision making (see chart for details).  69 year old male with history of diabetes both  on long-acting [name] and a Humalog sliding scale presents with altered mental status and hypoglycemia which has now resolved.  The patient has no acute complaints at this time other than feeling a bit tired.  I reviewed the past medical records in [place].  The patient was admitted with DKA earlier this month.  Subsequently he was seen on 1/23 due to hypoglycemia, and at that time he reported that he gave himself too much of his sliding scale short acting insulin the evening before and only ate a small breakfast.  I had an extensive discussion with the patient about his insulin management over the last few days.  He states that yesterday he hesitated about taking his [name] in the morning because his glucose was only in the 60s, so he ate something to bring it up to 100 before he took it.  He states he has eaten a bit less than normal in the last few days.  He did not end up needing any of his sliding scale insulin yesterday, and then did not eat breakfast this morning.  On exam he is slightly tired appearing but overall comfortable.  His vital signs are normal except for mild hypertension.   Neurologic exam is nonfocal.  The remainder of the exam is unremarkable.  Per the past records, the patient was recently increased to 50 units of the [name] and it appears that given his current p.o. intake his glucose is running a bit low at baseline.  ----------------------------------------- 2:25 PM on 04/25/2019 -----------------------------------------  The lab work-up is unremarkable except for a slightly increased creatinine although I suspect this is due to mild dehydration.  His hemoglobin is stable.  The patient has received fluids, and he ate a meal.  The glucose is now elevated, however because of the risk for recurrent hypoglycemia we will forego additional insulin at this time.  The patient feels comfortable going home.  I instructed him to decrease to 40 units of the Tresiba from 50 since his glucose appears to be running too low in the last few days.  I instructed him to check his glucose frequently and contact his doctor if he notices a trend of readings that are too high or too low.  I instructed him to follow-up with his PMD, and I see that the patient also has a referral to see endocrinology.  I gave him thorough return precautions and he expressed understanding.  ____________________________________________   FINAL CLINICAL IMPRESSION(S) / ED DIAGNOSES  Final diagnoses:  Hypoglycemia      NEW MEDICATIONS STARTED DURING THIS VISIT:  New Prescriptions   No medications on file     Note:  This document was prepared using Dragon voice recognition software and may include unintentional dictation errors.    Arta Silence, MD 04/25/19 1426

## 2019-04-25 NOTE — Discharge Instructions (Addendum)
We recommend that you decrease your Tresiba insulin to 40 units once a day since your sugars have been running low recently.  You should continue your short acting insulin sliding scale as prescribed.  Make sure you are eating at least small meals regularly throughout the day.  Call your doctor to schedule a follow-up appointment.  You should also follow-up with the endocrinologist as planned.  Return to the ER immediately for new or worsening glucose readings, either too high or too low, or if you feel weak, dizzy, short of breath, too sleepy, feel like you are going to pass out, or any other new or worsening symptoms that concern you.

## 2019-04-25 NOTE — ED Triage Notes (Signed)
Pt arrival via [facility] from home due to hypoglycemia. EMS states that the patient was at work when his coworkers found him laying on a bed with his eyes rolled back in his head. His blood sugar was 28 with them, they got him to drink 3 glasses of orange juice. With EMS his first blood sugar was 65.  EMS gave him one tube of oral glucose and got a second blood sugar of 105. Pt typically takes humolog and truceva at home but did not take anything last night and says he hasn't eaten much today.  VS with EMS- 180/90, 81 HR, 100% on RA

## 2019-04-26 ENCOUNTER — Ambulatory Visit (INDEPENDENT_AMBULATORY_CARE_PROVIDER_SITE_OTHER): Payer: Medicare HMO | Admitting: Physician Assistant

## 2019-04-26 ENCOUNTER — Ambulatory Visit: Payer: Self-pay | Admitting: *Deleted

## 2019-04-26 ENCOUNTER — Encounter: Payer: Self-pay | Admitting: Physician Assistant

## 2019-04-26 VITALS — BP 126/70 | HR 98 | Temp 96.8°F | Wt 170.0 lb

## 2019-04-26 DIAGNOSIS — E1040 Type 1 diabetes mellitus with diabetic neuropathy, unspecified: Secondary | ICD-10-CM

## 2019-04-26 NOTE — Telephone Encounter (Signed)
Tried calling patient. No answer or voice  message system. Will try calling back later.

## 2019-04-26 NOTE — Telephone Encounter (Signed)
His sugars are probably low because in the past he didn't take the tresiba consistently, but now that he is taking consistently he doesn't need as much.  He should reduce dose of Tresiba to 26 units, but still needs to take it consistently every day until his follow up.

## 2019-04-26 NOTE — Patient Instructions (Signed)
Diabetes Mellitus and Exercise Exercising regularly is important for your overall health, especially when you have diabetes (diabetes mellitus). Exercising is not only about losing weight. It has many other health benefits, such as increasing muscle strength and bone density and reducing body fat and stress. This leads to improved fitness, flexibility, and endurance, all of which result in better overall health. Exercise has additional benefits for people with diabetes, including:  Reducing appetite.  Helping to lower and control blood glucose.  Lowering blood pressure.  Helping to control amounts of fatty substances (lipids) in the blood, such as cholesterol and triglycerides.  Helping the body to respond better to insulin (improving insulin sensitivity).  Reducing how much insulin the body needs.  Decreasing the risk for heart disease by: ? Lowering cholesterol and triglyceride levels. ? Increasing the levels of good cholesterol. ? Lowering blood glucose levels. What is my activity plan? Your health care provider or certified diabetes educator can help you make a plan for the type and frequency of exercise (activity plan) that works for you. Make sure that you:  Do at least 150 minutes of moderate-intensity or vigorous-intensity exercise each week. This could be brisk walking, biking, or water aerobics. ? Do stretching and strength exercises, such as yoga or weightlifting, at least 2 times a week. ? Spread out your activity over at least 3 days of the week.  Get some form of physical activity every day. ? Do not go more than 2 days in a row without some kind of physical activity. ? Avoid being inactive for more than 30 minutes at a time. Take frequent breaks to walk or stretch.  Choose a type of exercise or activity that you enjoy, and set realistic goals.  Start slowly, and gradually increase the intensity of your exercise over time. What do I need to know about managing my  diabetes?   Check your blood glucose before and after exercising. ? If your blood glucose is 240 mg/dL (13.3 mmol/L) or higher before you exercise, check your urine for ketones. If you have ketones in your urine, do not exercise until your blood glucose returns to normal. ? If your blood glucose is 100 mg/dL (5.6 mmol/L) or lower, eat a snack containing 15-20 grams of carbohydrate. Check your blood glucose 15 minutes after the snack to make sure that your level is above 100 mg/dL (5.6 mmol/L) before you start your exercise.  Know the symptoms of low blood glucose (hypoglycemia) and how to treat it. Your risk for hypoglycemia increases during and after exercise. Common symptoms of hypoglycemia can include: ? Hunger. ? Anxiety. ? Sweating and feeling clammy. ? Confusion. ? Dizziness or feeling light-headed. ? Increased heart rate or palpitations. ? Blurry vision. ? Tingling or numbness around the mouth, lips, or tongue. ? Tremors or shakes. ? Irritability.  Keep a rapid-acting carbohydrate snack available before, during, and after exercise to help prevent or treat hypoglycemia.  Avoid injecting insulin into areas of the body that are going to be exercised. For example, avoid injecting insulin into: ? The arms, when playing tennis. ? The legs, when jogging.  Keep records of your exercise habits. Doing this can help you and your health care provider adjust your diabetes management plan as needed. Write down: ? Food that you eat before and after you exercise. ? Blood glucose levels before and after you exercise. ? The type and amount of exercise you have done. ? When your insulin is expected to peak, if you use   insulin. Avoid exercising at times when your insulin is peaking.  When you start a new exercise or activity, work with your health care provider to make sure the activity is safe for you, and to adjust your insulin, medicines, or food intake as needed.  Drink plenty of water while  you exercise to prevent dehydration or heat stroke. Drink enough fluid to keep your urine clear or pale yellow. Summary  Exercising regularly is important for your overall health, especially when you have diabetes (diabetes mellitus).  Exercising has many health benefits, such as increasing muscle strength and bone density and reducing body fat and stress.  Your health care provider or certified diabetes educator can help you make a plan for the type and frequency of exercise (activity plan) that works for you.  When you start a new exercise or activity, work with your health care provider to make sure the activity is safe for you, and to adjust your insulin, medicines, or food intake as needed. This information is not intended to replace advice given to you by your health care provider. Make sure you discuss any questions you have with your health care provider. Document Revised: 10/08/2016 Document Reviewed: 08/26/2015 Elsevier Patient Education  2020 Elsevier Inc.  

## 2019-04-26 NOTE — Progress Notes (Signed)
Patient: Nicholas Weiss Male    DOB: [birth year]   69 y.o.   MRN: [id number] Visit Date: 04/26/2019  Today's Provider: Trinna Post, PA-C   Chief Complaint  Patient presents with  . Follow-up   Subjective:     HPI Patient with a history of poorly controlled Type I diabetes presents today with concerns over hypoglycemia. States that he is concerned about taking insulin [facility]) due to his blood sugar dropping everytime he take the [facility].  Patient states that he gave his self the [facility] around 5:30 Am and then went back to sleep and does not remember anything afterwards. Patient states that he passed out around 12 noon and his son had called EMS due to him hitting his head. Patient had a  A1C done on 04/05/2019 and it was 12.0. He wants to figure out if he should stop taking the [facility] or be switched to another medication.  Partner reports that when he was found, his sugar was 26. Patient reports he used novolin last night before bed 10 units -at10 pm and went to bed at 1:00 AM. Reports his sugars were "three something" before he ate dinner. He woke up drenched in sweat, sugars was 26 reported by his family member. He has candy and juices by his bed side   During his ER visit his symptoms rapidly improved with oral intake. Today hasn't had anything besides half a peanut butter and jelly. Patient reports he doesn't eat regularly. His treiseba has been decreased from 50 units daily to 40 units daily after visiting the ER.    Allergies  Allergen Reactions  . Penicillins Other (See Comments)    Syncope  Has patient had a PCN reaction causing immediate rash, facial/tongue/throat swelling, SOB or lightheadedness with hypotension: No Has patient had a PCN reaction causing severe rash involving mucus membranes or skin necrosis: No Has patient had a PCN reaction that required hospitalization No Has patient had a PCN reaction occurring within the last 10 years: No If all of the  above answers are "NO", then may proceed with Cephalosporin use.      Current Outpatient Medications:  .  amLODipine (NORVASC) 10 MG tablet, Take 1 tablet (10 mg total) by mouth daily., Disp: 30 tablet, Rfl: 1 .  aspirin 81 MG chewable tablet, Chew 1 tablet by mouth daily., Disp: , Rfl:  .  gabapentin (NEURONTIN) 300 MG capsule, Take 1 capsule (300 mg total) by mouth at bedtime. PLEASE DISREGARD PREVIOUS PRESCRIPTION FOR THAT WAS FOR 30 TABLETS, Disp: 90 capsule, Rfl: 3 .  insulin degludec ([facility] FLEXTOUCH) 100 UNIT/ML SOPN FlexTouch Pen, 50 units once a day, increase as directed to keep fasting glucose <120, Disp: 5 pen, Rfl: 12 .  insulin regular (NOVOLIN R RELION) 100 units/mL injection, Inject 0.04-0.12 mLs (4-12 Units total) into the skin 3 (three) times daily before meals. According to sliding scale, Disp: 10 mL, Rfl: 11 .  lovastatin (MEVACOR) 40 MG tablet, Take 1 tablet (40 mg total) by mouth at bedtime., Disp: 90 tablet, Rfl: 4  Review of Systems  Social History   Tobacco Use  . Smoking status: Never Smoker  . Smokeless tobacco: Never Used  Substance Use Topics  . Alcohol use: Not Currently    Alcohol/week: 1.0 standard drinks    Types: 1 Cans of beer per week    Comment: Months since last ETOH consumptoin 04/22/2019      Objective:   BP 126/70 (BP  Location: Left Arm, Patient Position: Sitting, Cuff Size: Normal)   Pulse 98   Temp (!) 96.8 F (36 C) (Temporal)   Wt 170 lb (77.1 kg)   BMI 23.71 kg/m  Vitals:   04/26/19 1355  BP: 126/70  Pulse: 98  Temp: (!) 96.8 F (36 C)  TempSrc: Temporal  Weight: 170 lb (77.1 kg)  Body mass index is 23.71 kg/m.   Physical Exam Constitutional:      Appearance: Normal appearance.  Cardiovascular:     Rate and Rhythm: Normal rate and regular rhythm.     Heart sounds: Normal heart sounds.  Pulmonary:     Effort: Pulmonary effort is normal.     Breath sounds: Normal breath sounds.  Skin:    General: Skin is warm and  dry.  Neurological:     Mental Status: He is alert and oriented to person, place, and time. Mental status is at baseline.  Psychiatric:        Mood and Affect: Mood normal.        Behavior: Behavior normal.      No results found for any visits on 04/26/19.     Assessment & Plan    1. Type 1 diabetes mellitus with diabetic neuropathy, unspecified ([place])  Poorly controlled. His PCP has placed referral to [facility] on [phone]. His partner asks if I can call over to their clinic and make an appointment today. I have informed her that I cannot - the physicians personally review each referral and will schedule him when ready. Advised patient that he needs to be careful to not take his meal time insulin so close to bed as it has both rapid action but also does last for several hours in his body and can contribute to night time hypoglycemia. Patient also does not eat regularly. Patient has longstanding history of noncompliance with follow up and medication usage. I think he would be best served by endocrinology and this is underway. He may keep treseiba at 40 units daily until otherwise instructed by specialist.   The entirety of the information documented in the History of Present Illness, Review of Systems and Physical Exam were personally obtained by me. Portions of this information were initially documented by [facility] and reviewed by me for thoroughness and accuracy.        Trinna Post, PA-C  [facility]

## 2019-04-26 NOTE — Telephone Encounter (Signed)
Patient was up early this morning with high reading- took 40 unit [place] and went back to bed. Patient woke low and EMS were called. Patient refused to go back to ED for this. Patient states glucose has been high this week- and then he bottoms out after taking medication. Patient does admit his diet is not good. Appointment given due to patient ED history in the last few days.patient advised to monitor glucose level until appointment time.  Reason for Disposition . [1] Blood glucose < 70  mg/dL (3.9 mmol/L) or symptomatic, now improved with Care Advice AND [2] cause unknown  Answer Assessment - Initial Assessment Questions 1. SYMPTOMS: "What symptoms are you concerned about?"     43- glucose level-EMS called and got up to 91 with sugar water, peanut butter sandwich and orange juice 2. ONSET:  "When did the symptoms start?"     Ongoing- was seen yesterday at ED for same issue 3. BLOOD GLUCOSE: "What is your blood glucose level?"      Noon-12 after EMT left 4. USUAL RANGE: "What is your blood glucose level usually?" (e.g., usual fasting morning value, usual evening value)     200 5. TYPE 1 or 2:  "Do you know what type of diabetes you have?"  (e.g., Type 1, Type 2, Gestational; doesn't know)      Type 2 6. INSULIN: "Do you take insulin?" "What type of insulin(s) do you use? What is the mode of delivery? (syringe, pen; injection or pump) "When did you last give yourself an insulin dose?" (i.e., time or hours/minutes ago) "How much did you give?" (i.e., how many units)     Yes- triceba and nova log- last used this morning- 5:40 - took 40 units- reading 300 7. DIABETES PILLS: "Do you take any pills for your diabetes?"     no 8. OTHER SYMPTOMS: "Do you have any symptoms?" (e.g., fever, frequent urination, difficulty breathing, vomiting)     no 9. LOW BLOOD GLUCOSE TREATMENT: "What have you done so far to treat the low blood glucose level?"     Eating/drinking 10. FOOD: "When did you last eat or  drink?"       Stopped eating- 12 noon 11. ALONE: "Are you alone right now or is someone with you?"        No- son is calling 12. PREGNANCY: "Is there any chance you are pregnant?" "When was your last menstrual period?"       n/a  Protocols used: DIABETES - LOW BLOOD SUGAR-A-AH

## 2019-04-27 ENCOUNTER — Encounter: Payer: Self-pay | Admitting: Internal Medicine

## 2019-04-27 ENCOUNTER — Other Ambulatory Visit: Payer: Self-pay

## 2019-04-27 ENCOUNTER — Observation Stay
Admission: EM | Admit: 2019-04-27 | Discharge: 2019-04-28 | Disposition: A | Discharge status: Home / Self Care | Payer: Medicare HMO | Attending: Internal Medicine | Admitting: Internal Medicine

## 2019-04-27 DIAGNOSIS — E1022 Type 1 diabetes mellitus with diabetic chronic kidney disease: Secondary | ICD-10-CM | POA: Diagnosis not present

## 2019-04-27 DIAGNOSIS — N183 Chronic kidney disease, stage 3 unspecified: Secondary | ICD-10-CM | POA: Diagnosis present

## 2019-04-27 DIAGNOSIS — Z794 Long term (current) use of insulin: Secondary | ICD-10-CM | POA: Diagnosis not present

## 2019-04-27 DIAGNOSIS — R4189 Other symptoms and signs involving cognitive functions and awareness: Secondary | ICD-10-CM | POA: Diagnosis not present

## 2019-04-27 DIAGNOSIS — Z880 Allergy status to penicillin: Secondary | ICD-10-CM | POA: Diagnosis not present

## 2019-04-27 DIAGNOSIS — Z03818 Encounter for observation for suspected exposure to other biological agents ruled out: Secondary | ICD-10-CM | POA: Diagnosis not present

## 2019-04-27 DIAGNOSIS — E1149 Type 2 diabetes mellitus with other diabetic neurological complication: Secondary | ICD-10-CM

## 2019-04-27 DIAGNOSIS — E1040 Type 1 diabetes mellitus with diabetic neuropathy, unspecified: Secondary | ICD-10-CM | POA: Diagnosis present

## 2019-04-27 DIAGNOSIS — E1165 Type 2 diabetes mellitus with hyperglycemia: Secondary | ICD-10-CM

## 2019-04-27 DIAGNOSIS — E11649 Type 2 diabetes mellitus with hypoglycemia without coma: Secondary | ICD-10-CM | POA: Diagnosis not present

## 2019-04-27 DIAGNOSIS — Z79899 Other long term (current) drug therapy: Secondary | ICD-10-CM | POA: Insufficient documentation

## 2019-04-27 DIAGNOSIS — E10649 Type 1 diabetes mellitus with hypoglycemia without coma: Principal | ICD-10-CM | POA: Insufficient documentation

## 2019-04-27 DIAGNOSIS — E7800 Pure hypercholesterolemia, unspecified: Secondary | ICD-10-CM | POA: Insufficient documentation

## 2019-04-27 DIAGNOSIS — E162 Hypoglycemia, unspecified: Secondary | ICD-10-CM | POA: Diagnosis not present

## 2019-04-27 DIAGNOSIS — I129 Hypertensive chronic kidney disease with stage 1 through stage 4 chronic kidney disease, or unspecified chronic kidney disease: Secondary | ICD-10-CM | POA: Diagnosis not present

## 2019-04-27 DIAGNOSIS — E1065 Type 1 diabetes mellitus with hyperglycemia: Secondary | ICD-10-CM | POA: Diagnosis not present

## 2019-04-27 DIAGNOSIS — N1831 Chronic kidney disease, stage 3a: Secondary | ICD-10-CM | POA: Diagnosis not present

## 2019-04-27 DIAGNOSIS — R000 Tachycardia, unspecified: Secondary | ICD-10-CM | POA: Diagnosis not present

## 2019-04-27 DIAGNOSIS — Z20822 Contact with and (suspected) exposure to covid-19: Secondary | ICD-10-CM | POA: Diagnosis not present

## 2019-04-27 DIAGNOSIS — E161 Other hypoglycemia: Secondary | ICD-10-CM | POA: Diagnosis not present

## 2019-04-27 DIAGNOSIS — I10 Essential (primary) hypertension: Secondary | ICD-10-CM

## 2019-04-27 DIAGNOSIS — N170 Acute kidney failure with tubular necrosis: Secondary | ICD-10-CM | POA: Diagnosis not present

## 2019-04-27 DIAGNOSIS — Z7982 Long term (current) use of aspirin: Secondary | ICD-10-CM | POA: Insufficient documentation

## 2019-04-27 DIAGNOSIS — E785 Hyperlipidemia, unspecified: Secondary | ICD-10-CM | POA: Diagnosis present

## 2019-04-27 LAB — URINALYSIS, COMPLETE (UACMP) WITH MICROSCOPIC
Bacteria, UA: NONE SEEN
Bilirubin Urine: NEGATIVE
Glucose, UA: >=500 mg/dL — AB
Hgb urine dipstick: NEGATIVE
Ketones, ur: NEGATIVE mg/dL
Leukocytes,Ua: NEGATIVE
Nitrite: NEGATIVE
Protein, ur: NEGATIVE mg/dL
Specific Gravity, Urine: 1.003 — ABNORMAL LOW (ref 1.005–1.030)
Squamous Epithelial / HPF: NONE SEEN (ref 0–5)
pH: 6 (ref 5.0–8.0)

## 2019-04-27 LAB — COMPREHENSIVE METABOLIC PANEL
ALT: 20 U/L (ref 0–44)
AST: 25 U/L (ref 15–41)
Albumin: 3.4 g/dL — ABNORMAL LOW (ref 3.5–5.0)
Alkaline Phosphatase: 46 U/L (ref 38–126)
Anion gap: 10 (ref 5–15)
BUN: 23 mg/dL (ref 8–23)
CO2: 25 mmol/L (ref 22–32)
Calcium: 8.6 mg/dL — ABNORMAL LOW (ref 8.9–10.3)
Chloride: 99 mmol/L (ref 98–111)
Creatinine, Ser: 1.62 mg/dL — ABNORMAL HIGH (ref 0.61–1.24)
GFR calc Af Amer: 51 mL/min — ABNORMAL LOW (ref >60)
GFR calc non Af Amer: 44 mL/min — ABNORMAL LOW (ref >60)
Glucose, Bld: 213 mg/dL — ABNORMAL HIGH (ref 70–99)
Potassium: 4 mmol/L (ref 3.5–5.1)
Sodium: 134 mmol/L — ABNORMAL LOW (ref 135–145)
Total Bilirubin: 0.5 mg/dL (ref 0.3–1.2)
Total Protein: 8.1 g/dL (ref 6.5–8.1)

## 2019-04-27 LAB — TROPONIN I (HIGH SENSITIVITY)
Troponin I (High Sensitivity): 20 ng/L — ABNORMAL HIGH (ref <18)
Troponin I (High Sensitivity): 25 ng/L — ABNORMAL HIGH (ref <18)

## 2019-04-27 LAB — GLUCOSE, CAPILLARY
Glucose-Capillary: 212 mg/dL — ABNORMAL HIGH (ref 70–99)
Glucose-Capillary: 198 mg/dL — ABNORMAL HIGH (ref 70–99)
Glucose-Capillary: 195 mg/dL — ABNORMAL HIGH (ref 70–99)
Glucose-Capillary: 221 mg/dL — ABNORMAL HIGH (ref 70–99)
Glucose-Capillary: 175 mg/dL — ABNORMAL HIGH (ref 70–99)
Glucose-Capillary: 109 mg/dL — ABNORMAL HIGH (ref 70–99)
Glucose-Capillary: 101 mg/dL — ABNORMAL HIGH (ref 70–99)
Glucose-Capillary: 145 mg/dL — ABNORMAL HIGH (ref 70–99)
Glucose-Capillary: 191 mg/dL — ABNORMAL HIGH (ref 70–99)
Glucose-Capillary: 226 mg/dL — ABNORMAL HIGH (ref 70–99)
Glucose-Capillary: 259 mg/dL — ABNORMAL HIGH (ref 70–99)
Glucose-Capillary: 300 mg/dL — ABNORMAL HIGH (ref 70–99)

## 2019-04-27 LAB — CBC
HCT: 28.9 % — ABNORMAL LOW (ref 39.0–52.0)
Hemoglobin: 9.2 g/dL — ABNORMAL LOW (ref 13.0–17.0)
MCH: 30.3 pg (ref 26.0–34.0)
MCHC: 31.8 g/dL (ref 30.0–36.0)
MCV: 95.1 fL (ref 80.0–100.0)
Platelets: 267 10*3/uL (ref 150–400)
RBC: 3.04 MIL/uL — ABNORMAL LOW (ref 4.22–5.81)
RDW: 13.1 % (ref 11.5–15.5)
WBC: 5.9 10*3/uL (ref 4.0–10.5)
nRBC: 0 % (ref 0.0–0.2)

## 2019-04-27 LAB — URINE DRUG SCREEN, QUALITATIVE (ARMC ONLY)
Amphetamines, Ur Screen: NOT DETECTED
Barbiturates, Ur Screen: NOT DETECTED
Benzodiazepine, Ur Scrn: NOT DETECTED
Cannabinoid 50 Ng, Ur ~~LOC~~: NOT DETECTED
Cocaine Metabolite,Ur ~~LOC~~: POSITIVE — AB
MDMA (Ecstasy)Ur Screen: NOT DETECTED
Methadone Scn, Ur: NOT DETECTED
Opiate, Ur Screen: NOT DETECTED
Phencyclidine (PCP) Ur S: NOT DETECTED
Tricyclic, Ur Screen: NOT DETECTED

## 2019-04-27 LAB — SARS CORONAVIRUS 2 (TAT 6-24 HRS): SARS Coronavirus 2: NEGATIVE

## 2019-04-27 MED ORDER — HYDROXYZINE HCL 10 MG PO TABS
10.0000 mg | ORAL_TABLET | Freq: Three times a day (TID) | ORAL | Status: DC | PRN
  Filled 2019-04-27: qty 1

## 2019-04-27 MED ORDER — ACETAMINOPHEN 325 MG PO TABS
650.0000 mg | ORAL_TABLET | Freq: Four times a day (QID) | ORAL | Status: DC | PRN
  Administered 2019-04-28: 650 mg via ORAL
  Filled 2019-04-27: qty 2

## 2019-04-27 MED ORDER — HYDRALAZINE HCL 50 MG PO TABS
25.0000 mg | ORAL_TABLET | Freq: Three times a day (TID) | ORAL | Status: DC | PRN
  Filled 2019-04-27: qty 0.5

## 2019-04-27 MED ORDER — ENOXAPARIN SODIUM 40 MG/0.4ML ~~LOC~~ SOLN
40.0000 mg | SUBCUTANEOUS | Status: DC
  Administered 2019-04-27 – 2019-04-28 (×2): 40 mg via SUBCUTANEOUS
  Filled 2019-04-27 (×2): qty 0.4

## 2019-04-27 MED ORDER — ONDANSETRON HCL 4 MG PO TABS: 4.0000 mg | ORAL_TABLET | Freq: Four times a day (QID) | ORAL | Status: DC | PRN

## 2019-04-27 MED ORDER — AMLODIPINE BESYLATE 10 MG PO TABS
10.0000 mg | ORAL_TABLET | Freq: Every day | ORAL | Status: DC
  Administered 2019-04-27 – 2019-04-28 (×2): 10 mg via ORAL
  Filled 2019-04-27 (×2): qty 1

## 2019-04-27 MED ORDER — MAGNESIUM HYDROXIDE 400 MG/5ML PO SUSP: 30.0000 mL | Freq: Every day | ORAL | Status: DC | PRN

## 2019-04-27 MED ORDER — ENSURE MAX PROTEIN PO LIQD
11.0000 [oz_av] | Freq: Two times a day (BID) | ORAL | Status: DC
  Filled 2019-04-27: qty 330

## 2019-04-27 MED ORDER — PRAVASTATIN SODIUM 40 MG PO TABS
40.0000 mg | ORAL_TABLET | Freq: Every day | ORAL | Status: DC
  Administered 2019-04-27: 40 mg via ORAL
  Filled 2019-04-27: qty 1

## 2019-04-27 MED ORDER — INSULIN ASPART 100 UNIT/ML ~~LOC~~ SOLN
0.0000 [IU] | Freq: Three times a day (TID) | SUBCUTANEOUS | Status: DC
  Administered 2019-04-27: 5 [IU] via SUBCUTANEOUS
  Administered 2019-04-27: 2 [IU] via SUBCUTANEOUS
  Administered 2019-04-28: 1 [IU] via SUBCUTANEOUS
  Filled 2019-04-27 (×3): qty 1

## 2019-04-27 MED ORDER — ONDANSETRON HCL 4 MG/2ML IJ SOLN: 4.0000 mg | Freq: Four times a day (QID) | INTRAMUSCULAR | Status: DC | PRN

## 2019-04-27 MED ORDER — TRAZODONE HCL 50 MG PO TABS: 25.0000 mg | ORAL_TABLET | Freq: Every evening | ORAL | Status: DC | PRN

## 2019-04-27 MED ORDER — DEXTROSE-NACL 5-0.45 % IV SOLN: INTRAVENOUS | Status: DC

## 2019-04-27 MED ORDER — ACETAMINOPHEN 650 MG RE SUPP: 650.0000 mg | Freq: Four times a day (QID) | RECTAL | Status: DC | PRN

## 2019-04-27 MED ORDER — ASPIRIN 81 MG PO CHEW
81.0000 mg | CHEWABLE_TABLET | Freq: Every day | ORAL | Status: DC
  Administered 2019-04-27 – 2019-04-28 (×2): 81 mg via ORAL
  Filled 2019-04-27 (×2): qty 1

## 2019-04-27 MED ORDER — DEXTROSE 50 % IV SOLN: 50.0000 mL | INTRAVENOUS | Status: DC | PRN

## 2019-04-27 MED ORDER — DEXTROSE-NACL 5-0.9 % IV SOLN: INTRAVENOUS | Status: DC

## 2019-04-27 MED ORDER — GABAPENTIN 300 MG PO CAPS
300.0000 mg | ORAL_CAPSULE | Freq: Every day | ORAL | Status: DC
  Administered 2019-04-27: 300 mg via ORAL
  Filled 2019-04-27: qty 1

## 2019-04-27 NOTE — H&P (Signed)
History and Physical    Nicholas Weiss TIW:[id number] DOB: 1950/06/16 DOA: 04/27/2019  Referring MD/NP/PA:   PCP: Birdie Sons, MD   Patient coming from:  The patient is coming from home.  At baseline, pt is independent for most of ADL.        Chief Complaint: Hypoglycemia  HPI: Nicholas Weiss is a 69 y.o. male with medical history significant of hypertension, hyperlipidemia, type I diabetes mellitus, DKA, CKD stage IIIa, who presents with hypoglycemia.  Per report, pt was noted to hypoglycemia with blood sugar 45. Pt states that he took 40 units of Tresiba yesterday morning due to blood sugar being 227. Pt was given D10 and orange juice.  Patient was noted to be hypertensive and tachycardic. Of note, this was the second time in the last 24 hours. EMS presented to the patient's home yesterday morning at about 7:00 at which point the patient's glucose was noted to be 42. Patient was given glucose and advised to be transported to the emergency department, however the patient refused. Reportedly, pt was unresponsive initially. Pt was started on D5-1/2 NS in ED. His mental status improved quickly. When I saw pt in ED, he is alert, and orientated x4.  He moves all extremities normally.  No unilateral weakness, numbness or tingling his extremities.  Patient denies chest pain, cough, shortness breath, fever or chills.  No nausea, vomiting, diarrhea, abdominal pain, symptoms of UTI.  ED Course: pt was found to have WBC 5.9, positive UDS for cocaine, negative urinalysis, pending Covid PCR, renal function close to baseline, temperature 96.8 --> 98, blood pressure 178/101, tachycardia, oxygen saturation 96% on room air.  Patient is placed on MedSurg bed for observation.  Review of Systems:   General: no fevers, chills, no body weight gain, has fatigue HEENT: no blurry vision, hearing changes or sore throat Respiratory: no dyspnea, coughing, wheezing CV: no chest pain, no palpitations GI: no  nausea, vomiting, abdominal pain, diarrhea, constipation GU: no dysuria, burning on urination, increased urinary frequency, hematuria  Ext: no leg edema Neuro: no unilateral weakness, numbness, or tingling, no vision change or hearing loss. Had unresponsiveness Skin: no rash, no skin tear. MSK: No muscle spasm, no deformity, no limitation of range of movement in spin Heme: No easy bruising.  Travel history: No recent long distant travel.  Allergy:  Allergies  Allergen Reactions  . Penicillins Other (See Comments)    Syncope  Has patient had a PCN reaction causing immediate rash, facial/tongue/throat swelling, SOB or lightheadedness with hypotension: No Has patient had a PCN reaction causing severe rash involving mucus membranes or skin necrosis: No Has patient had a PCN reaction that required hospitalization No Has patient had a PCN reaction occurring within the last 10 years: No If all of the above answers are "NO", then may proceed with Cephalosporin use.     Past Medical History:  Diagnosis Date  . DKA (diabetic ketoacidoses) ([place]) 04/06/2016  . Hypercholesteremia   . Hypertension     Past Surgical History:  Procedure Laterality Date  . KNEE SURGERY Right    Torn meniscus  . KNEE SURGERY Left     Social History:  reports that he has never smoked. He has never used smokeless tobacco. He reports previous alcohol use of about 1.0 standard drinks of alcohol per week. He reports current drug use. Drug: Marijuana.  Family History:  Family History  Problem Relation Age of Onset  . Heart attack Father   . Hypertension  Sister   . Cancer Sister      Prior to Admission medications   Medication Sig Start Date End Date Taking? Authorizing Provider  amLODipine (NORVASC) 10 MG tablet Take 1 tablet (10 mg total) by mouth daily. 04/07/19  Yes Dhungel, Nishant, MD  aspirin 81 MG chewable tablet Chew 1 tablet by mouth daily.   Yes [provider]  gabapentin (NEURONTIN) 300 MG  capsule Take 1 capsule (300 mg total) by mouth at bedtime. PLEASE DISREGARD PREVIOUS PRESCRIPTION FOR THAT WAS FOR 30 TABLETS 12/19/18  Yes Birdie Sons, MD  insulin degludec (TRESIBA FLEXTOUCH) 100 UNIT/ML SOPN FlexTouch Pen 50 units once a day, increase as directed to keep fasting glucose <120 Patient taking differently: Inject 40 Units into the skin daily. 40 units once a day, increase as directed to keep fasting glucose <120 04/07/19  Yes Dhungel, Nishant, MD  insulin regular (NOVOLIN R RELION) 100 units/mL injection Inject 0.04-0.12 mLs (4-12 Units total) into the skin 3 (three) times daily before meals. According to sliding scale 01/03/18  Yes Birdie Sons, MD  lovastatin (MEVACOR) 40 MG tablet Take 1 tablet (40 mg total) by mouth at bedtime. 08/02/18  Yes Birdie Sons, MD    Physical Exam: Vitals:   04/27/19 0530 04/27/19 0600 04/27/19 0630 04/27/19 0700  BP: (!) 159/97 (!) 145/101 (!) 178/101 (!) 161/105  Pulse: 97 93 (!) 106 92  Resp: 16 11 (!) 22 14  Temp:      TempSrc:      SpO2: 100% 100% 96% 99%  Weight:      Height:       General: Not in acute distress HEENT:       Eyes: PERRL, EOMI, no scleral icterus.       ENT: No discharge from the ears and nose, no pharynx injection, no tonsillar enlargement.        Neck: No JVD, no bruit, no mass felt. Heme: No neck lymph node enlargement. Cardiac: S1/S2, RRR, No murmurs, No gallops or rubs. Respiratory: No rales, wheezing, rhonchi or rubs. GI: Soft, nondistended, nontender, no rebound pain, no organomegaly, BS present. GU: No hematuria Ext: No pitting leg edema bilaterally. 2+DP/PT pulse bilaterally. Musculoskeletal: No joint deformities, No joint redness or warmth, no limitation of ROM in spin. Skin: No rashes.  Neuro: Alert, oriented X3, cranial nerves II-XII grossly intact, moves all extremities normally. Muscle strength 5/5 in all extremities, sensation to light touch intact.  Psych: Patient is not psychotic, no  suicidal or hemocidal ideation.  Labs on Admission: I have personally reviewed following labs and imaging studies  CBC: Recent Labs  Lab 04/22/19 0925 04/25/19 1112 04/27/19 0434  WBC 5.1 5.3 5.9  NEUTROABS  --  4.2  --   HGB 9.7* 10.0* 9.2*  HCT 30.6* 30.8* 28.9*  MCV 94.7 95.1 95.1  PLT 356 332 127   Basic Metabolic Panel: Recent Labs  Lab 04/22/19 0925 04/25/19 1112 04/27/19 0434  NA 141 138 134*  K 4.1 4.1 4.0  CL 105 104 99  CO2 27 25 25   GLUCOSE 108* 183* 213*  BUN 18 25* 23  CREATININE 1.44* 1.66* 1.62*  CALCIUM 8.8* 8.8* 8.6*   GFR: Estimated Creatinine Clearance: 47.8 mL/min (A) (by C-G formula based on SCr of 1.62 mg/dL (H)). Liver Function Tests: Recent Labs  Lab 04/27/19 0434  AST 25  ALT 20  ALKPHOS 46  BILITOT 0.5  PROT 8.1  ALBUMIN 3.4*   No results for input(s):  LIPASE, AMYLASE in the last 168 hours. No results for input(s): AMMONIA in the last 168 hours. Coagulation Profile: No results for input(s): INR, PROTIME in the last 168 hours. Cardiac Enzymes: No results for input(s): CKTOTAL, CKMB, CKMBINDEX, TROPONINI in the last 168 hours. BNP (last 3 results) No results for input(s): PROBNP in the last 8760 hours. HbA1C: No results for input(s): HGBA1C in the last 72 hours. CBG: Recent Labs  Lab 04/27/19 0424 04/27/19 0458 04/27/19 0531 04/27/19 0603 04/27/19 0634  GLUCAP 212* 198* 221* 195* 175*   Lipid Profile: No results for input(s): CHOL, HDL, LDLCALC, TRIG, CHOLHDL, LDLDIRECT in the last 72 hours. Thyroid Function Tests: No results for input(s): TSH, T4TOTAL, FREET4, T3FREE, THYROIDAB in the last 72 hours. Anemia Panel: No results for input(s): VITAMINB12, FOLATE, FERRITIN, TIBC, IRON, RETICCTPCT in the last 72 hours. Urine analysis:    Component Value Date/Time   COLORURINE COLORLESS (A) 04/27/2019 0533   APPEARANCEUR CLEAR (A) 04/27/2019 0533   LABSPEC 1.003 (L) 04/27/2019 0533   PHURINE 6.0 04/27/2019 0533   GLUCOSEU  >=500 (A) 04/27/2019 0533   HGBUR NEGATIVE 04/27/2019 0533   BILIRUBINUR NEGATIVE 04/27/2019 0533   KETONESUR NEGATIVE 04/27/2019 0533   PROTEINUR NEGATIVE 04/27/2019 0533   NITRITE NEGATIVE 04/27/2019 0533   LEUKOCYTESUR NEGATIVE 04/27/2019 0533   Sepsis Labs: @LABRCNTIP (procalcitonin:4,lacticidven:4) )No results found for this or any previous visit (from the past 240 hour(s)).   Radiological Exams on Admission: No results found.   EKG: Independently reviewed.  Sinus tachycardia, QTC 527, nonspecific T wave change.   Assessment/Plan Principal Problem:   Hypoglycemia Active Problems:   Type 1 diabetes mellitus with diabetic neuropathy, unspecified (HCC)   Hypercholesterolemia   Essential (primary) hypertension   CKD (chronic kidney disease) stage 3, GFR 30-59 ml/min   Unresponsiveness   Hypoglycemia: it is most likely due to inappropriate use of insulin, or continuation of insulin with decreased oral intake.  Patient is currently on D5-1/2NS, blood sugar level is 170s.   -will place on med-surg bed for obs -check CBG q2h -prn D50. -change IVF to D5-NS at 75 cc/h since his sodium level is 134 -will consult diabetic educator  Type 1 diabetes mellitus with diabetic neuropathy, unspecified and with complication of CKD-IIIa: Most recent A1c 12.0, poorly controled. Patient is taking Novolin and [name] at home -hold tresiba  -SSI  Hypercholesterolemia: -Pravastatin  Essential (primary) hypertension: -Continue home medications: Amlodipine -hydralazine prn  CKD (chronic kidney disease) stage 3a, GFR 30-59 ml/min: Baseline creatinine 1.4-2.0 recently.  His creatinine is 1.62, BUN 23, renal function close to baseline -Patient is on IV fluid as above -Follow-up renal function by BMP  Unresponsiveness: Mental status is back to baseline.  Patient is alert and orientated x4.  No focal neurologic findings physical examination.  Most likely due to hypoglycemia. Pt has positive  UDS for cocaine metabolites, which may have contributed partially.  -Will hold off brain image -Frequent neuro check -Treat hypoglycemia as above   DVT ppx: SQ Lovenox Code Status: Full code Family Communication: None at bed side.     Disposition Plan:  Anticipate discharge back to previous home environment Consults called:  none Admission status: Med-surg bed for obs  Date of Service 04/27/2019    [facility]   If 7PM-7AM, please contact night-coverage [email or web address] Password [facility] 04/27/2019, 7:39 AM

## 2019-04-27 NOTE — Telephone Encounter (Signed)
Tried calling patient. Was advised by a male who answered the phone that patient has been hospitalized at [facility].

## 2019-04-27 NOTE — Plan of Care (Signed)
  Problem: Clinical Measurements: Goal: Ability to maintain clinical measurements within normal limits will improve Outcome: Progressing Goal: Respiratory complications will improve Outcome: Progressing   Problem: Activity: Goal: Risk for activity intolerance will decrease Outcome: Progressing   Problem: Elimination: Goal: Will not experience complications related to urinary retention Outcome: Progressing   Problem: Safety: Goal: Ability to remain free from injury will improve Outcome: Progressing   

## 2019-04-27 NOTE — ED Provider Notes (Signed)
[facility] Emergency Department Provider Note  ____________________________________________   First MD Initiated Contact with Patient 04/27/19 [phone]     (approximate)  I have reviewed the triage vital signs and the nursing notes.   HISTORY  Chief Complaint Hypoglycemia    HPI Nicholas Weiss is a 69 y.o. male with below list of previous medical conditions including diabetes mellitus DKA hypertension hyperlipidemia presents to the emergency department via EMS secondary to hypoglycemia.  EMS personnel states that this was the second time to the patient's home in the last 24 hours.  EMS presented to the patient's home yesterday morning at roughly 7:00 at which point the patient's glucose was noted to be 42 patient was given glucose at that time and advised to be transported to the emergency department however the patient refused.  Tonight patient was noted to be hypoglycemic again and on EMS arrival patient's glucose noted to be 45.  Patient given 2 and 50 mL of D10 with orange juice.  Patient noted to be hypertensive and tachycardic.  Patient states that he took 40 units of Tresiba yesterday morning before the initial episode of hypoglycemia.        Past Medical History:  Diagnosis Date  . DKA (diabetic ketoacidoses) ([place]) 04/06/2016  . Hypercholesteremia   . Hypertension     Patient Active Problem List   Diagnosis Date Noted  . Lung nodule 04/07/2019  . Uncontrolled type 1 diabetes mellitus ([place])   . Acute renal failure with acute tubular necrosis superimposed on stage 3a chronic kidney disease ([place])   . Hypertensive urgency 04/05/2019  . AKI (acute kidney injury) ([place]) 04/05/2019  . CKD (chronic kidney disease) stage 3, GFR 30-59 ml/min 04/05/2019  . DKA (diabetic ketoacidoses) ([place]) 04/06/2016  . Hypercholesterolemia 01/24/2016  . Essential (primary) hypertension 06/07/2006  . Type 1 diabetes mellitus with diabetic neuropathy, unspecified ([place])  05/31/2006    Past Surgical History:  Procedure Laterality Date  . KNEE SURGERY Right    Torn meniscus  . KNEE SURGERY Left     Prior to Admission medications   Medication Sig Start Date End Date Taking? Authorizing Provider  amLODipine (NORVASC) 10 MG tablet Take 1 tablet (10 mg total) by mouth daily. 04/07/19   Dhungel, Flonnie Overman, MD  aspirin 81 MG chewable tablet Chew 1 tablet by mouth daily.    [provider]  gabapentin (NEURONTIN) 300 MG capsule Take 1 capsule (300 mg total) by mouth at bedtime. PLEASE DISREGARD PREVIOUS PRESCRIPTION FOR THAT WAS FOR 30 TABLETS 12/19/18   Birdie Sons, MD  insulin degludec (TRESIBA FLEXTOUCH) 100 UNIT/ML SOPN FlexTouch Pen 50 units once a day, increase as directed to keep fasting glucose <120 04/07/19   Dhungel, Nishant, MD  insulin regular (NOVOLIN R RELION) 100 units/mL injection Inject 0.04-0.12 mLs (4-12 Units total) into the skin 3 (three) times daily before meals. According to sliding scale 01/03/18   Birdie Sons, MD  lovastatin (MEVACOR) 40 MG tablet Take 1 tablet (40 mg total) by mouth at bedtime. 08/02/18   Birdie Sons, MD    Allergies Penicillins  Family History  Problem Relation Age of Onset  . Heart attack Father   . Hypertension Sister   . Cancer Sister     Social History Social History   Tobacco Use  . Smoking status: Never Smoker  . Smokeless tobacco: Never Used  Substance Use Topics  . Alcohol use: Not Currently    Alcohol/week: 1.0 standard drinks  Types: 1 Cans of beer per week    Comment: Months since last ETOH consumptoin 04/22/2019  . Drug use: Yes    Types: Marijuana    Comment: Last used 04/01/2019    Review of Systems Constitutional: No fever/chills Eyes: No visual changes. ENT: No sore throat. Cardiovascular: Denies chest pain. Respiratory: Denies shortness of breath. Gastrointestinal: No abdominal pain.  No nausea, no vomiting.  No diarrhea.  No constipation. Genitourinary: Negative  for dysuria. Musculoskeletal: Negative for neck pain.  Negative for back pain. Integumentary: Negative for rash. Neurological: Negative for headaches, focal weakness or numbness. Endocrine: Positive for hypoglycemia  ____________________________________________   PHYSICAL EXAM:  VITAL SIGNS: ED Triage Vitals  Enc Vitals Group     BP 04/27/19 0430 (!) 187/94     Pulse Rate 04/27/19 0427 (!) 105     Resp 04/27/19 0427 14     Temp --      Temp Source 04/27/19 0427 Oral     SpO2 04/27/19 0426 100 %     Weight 04/27/19 0428 77.1 kg (170 lb)     Height 04/27/19 0428 1.803 m (5\' 11" )     Head Circumference --      Peak Flow --      Pain Score 04/27/19 0427 0     Pain Loc --      Pain Edu? --      Excl. in [place]? --     Constitutional: Alert and oriented.  Eyes: Conjunctivae are normal.  Mouth/Throat: Patient is wearing a mask. Neck: No stridor.  No meningeal signs.   Cardiovascular: Normal rate, regular rhythm. Good peripheral circulation. Grossly normal heart sounds. Respiratory: Normal respiratory effort.  No retractions. Gastrointestinal: Soft and nontender. No distention.  Musculoskeletal: No lower extremity tenderness nor edema. No gross deformities of extremities. Neurologic:  Normal speech and language. No gross focal neurologic deficits are appreciated.  Skin:  Skin is warm, dry and intact. Psychiatric: Mood and affect are normal. Speech and behavior are normal.  ____________________________________________   LABS (all labs ordered are listed, but only abnormal results are displayed)  Labs Reviewed  GLUCOSE, CAPILLARY - Abnormal; Notable for the following components:      Result Value   Glucose-Capillary 212 (*)    All other components within normal limits  CBC  COMPREHENSIVE METABOLIC PANEL  URINALYSIS, COMPLETE (UACMP) WITH MICROSCOPIC  URINE DRUG SCREEN, QUALITATIVE ([facility] ONLY)  TROPONIN I (HIGH SENSITIVITY)    _________ Procedures    ____________________________________________   INITIAL IMPRESSION / MDM / ASSESSMENT AND PLAN / ED COURSE  As part of my medical decision making, I reviewed the following data within the electronic MEDICAL RECORD NUMBER   69 year old male presented with above-stated history and physical exam secondary to hypoglycemia.  Patient does take [name] with last dose being yesterday morning.  Patient noted to be hypertensive and tachycardic on arrival despite current glucose of 212.  Reviewed the patient's chart revealed previous use of cocaine.  Patient does admit to using cocaine "a few hours ago".Given persistent hypoglycemia risk due to [name] half life of 25 hours, patient discussed with the hospitalist for admission     ____________________________________________  FINAL CLINICAL IMPRESSION(S) / ED DIAGNOSES  Final diagnoses:  Hypoglycemia     MEDICATIONS GIVEN DURING THIS VISIT:  Medications  dextrose 5 %-0.45 % sodium chloride infusion (has no administration in time range)     ED Discharge Orders    None      *Please note:  DANG MATHISON was evaluated in Emergency Department on 04/27/2019 for the symptoms described in the history of present illness. He was evaluated in the context of the global COVID-19 pandemic, which necessitated consideration that the patient might be at risk for infection with the SARS-CoV-2 virus that causes COVID-19. Institutional protocols and algorithms that pertain to the evaluation of patients at risk for COVID-19 are in a state of rapid change based on information released by regulatory bodies including the CDC and federal and state organizations. These policies and algorithms were followed during the patient's care in the ED.  Some ED evaluations and interventions may be delayed as a result of limited staffing during the pandemic.*  Note:  This document was prepared using Dragon voice recognition software and may include unintentional dictation errors.    Gregor Hams, MD 04/27/19 [phone]

## 2019-04-27 NOTE — ED Triage Notes (Addendum)
Pt to ED via [facility] from home. Per EMS at 0700 yesterday EMS was dispatched for low CBG. Upon arrival pt CBG 44 and pt became unresponsive. Per EMS pt took 40units of Tresiba due to CBG being 227.   EMS dispatched this morning due to low CBG of 45. Pt given D10 and orange juice.  Upon arrival pt A&Ox4. CBG 212. Pt hypertensive and tachycardic.

## 2019-04-27 NOTE — Progress Notes (Signed)
Inpatient Diabetes Program Recommendations  AACE/ADA: New Consensus Statement on Inpatient Glycemic Control (2015)  Target Ranges:  Prepandial:   less than 140 mg/dL      Peak postprandial:   less than 180 mg/dL (1-2 hours)      Critically ill patients:  140 - 180 mg/dL   Lab Results  Component Value Date   GLUCAP 101 (H) 04/27/2019   HGBA1C 12.0 (H) 04/05/2019    Review of Glycemic Control Results for Nicholas Weiss, Nicholas Weiss (MRN [id number]) as of 04/27/2019 11:59  Ref. Range 04/27/2019 05:31 04/27/2019 06:03 04/27/2019 06:34 04/27/2019 09:10 04/27/2019 11:05  Glucose-Capillary Latest Ref Range: 70 - 99 mg/dL 221 (H) 195 (H) 175 (H) 109 (H) 101 (H)   Diabetes history: DM2 Outpatient Diabetes medications: Tresiba 40 units daily + Novoling R 4-12 units tid meal coverage Current orders for Inpatient glycemic control: Novolog correction tid + hs 0-9 units  Inpatient Diabetes Program Recommendations:   DM coordinator spoke with patient on admission 04/07/19 concerning A1c of 12.0. While in the hospital: -Lantus 20 units qd (50% home basal insulin dose) -Decrease hs correction to 0-5 units  Tresiba effects on CBGs may last up to 42 hrs. Post injection.  Thank you, Nicholas Weiss. Nicholas Cosper, RN, MSN, CDE  Diabetes Coordinator Inpatient Glycemic Control Team Team Pager [phone] (8am-5pm) 04/27/2019 12:09 PM

## 2019-04-27 NOTE — ED Notes (Addendum)
Q62min sugar checks initiated per MD order.

## 2019-04-28 ENCOUNTER — Telehealth: Payer: Self-pay

## 2019-04-28 DIAGNOSIS — E162 Hypoglycemia, unspecified: Secondary | ICD-10-CM | POA: Diagnosis not present

## 2019-04-28 LAB — BASIC METABOLIC PANEL
Anion gap: 6 (ref 5–15)
BUN: 20 mg/dL (ref 8–23)
CO2: 25 mmol/L (ref 22–32)
Calcium: 8.3 mg/dL — ABNORMAL LOW (ref 8.9–10.3)
Chloride: 107 mmol/L (ref 98–111)
Creatinine, Ser: 1.26 mg/dL — ABNORMAL HIGH (ref 0.61–1.24)
GFR calc Af Amer: >60 mL/min (ref >60)
GFR calc non Af Amer: 59 mL/min — ABNORMAL LOW (ref >60)
Glucose, Bld: 147 mg/dL — ABNORMAL HIGH (ref 70–99)
Potassium: 4.5 mmol/L (ref 3.5–5.1)
Sodium: 138 mmol/L (ref 135–145)

## 2019-04-28 LAB — GLUCOSE, CAPILLARY
Glucose-Capillary: 100 mg/dL — ABNORMAL HIGH (ref 70–99)
Glucose-Capillary: 102 mg/dL — ABNORMAL HIGH (ref 70–99)
Glucose-Capillary: 142 mg/dL — ABNORMAL HIGH (ref 70–99)

## 2019-04-28 LAB — CBC
HCT: 29.1 % — ABNORMAL LOW (ref 39.0–52.0)
Hemoglobin: 9.6 g/dL — ABNORMAL LOW (ref 13.0–17.0)
MCH: 30.7 pg (ref 26.0–34.0)
MCHC: 33 g/dL (ref 30.0–36.0)
MCV: 93 fL (ref 80.0–100.0)
Platelets: 245 10*3/uL (ref 150–400)
RBC: 3.13 MIL/uL — ABNORMAL LOW (ref 4.22–5.81)
RDW: 13.1 % (ref 11.5–15.5)
WBC: 3.4 10*3/uL — ABNORMAL LOW (ref 4.0–10.5)
nRBC: 0 % (ref 0.0–0.2)

## 2019-04-28 MED ORDER — TRESIBA FLEXTOUCH 100 UNIT/ML ~~LOC~~ SOPN: PEN_INJECTOR | SUBCUTANEOUS | 12 refills | Status: DC

## 2019-04-28 MED ORDER — GABAPENTIN 300 MG PO CAPS: 300.0000 mg | ORAL_CAPSULE | Freq: Two times a day (BID) | ORAL | 3 refills | Status: DC

## 2019-04-28 NOTE — Discharge Summary (Signed)
Physician Discharge Summary  [name] LPF:[id number] DOB: 10-05-1950 DOA: 04/27/2019  PCP: Birdie Sons, MD  Admit date: 04/27/2019 Discharge date: 04/28/2019  Admitted From: Home Disposition: Home  Recommendations for Outpatient Follow-up:  1. Follow up with PCP in 3 to 5 days 2. Decrease Tresiba dose to 20 units daily 3. Discuss dosing changes with PCP  Home Health: No Equipment/Devices: None Discharge Condition: Stable CODE STATUS: Full Diet recommendation: Carb modified  Brief/Interim Summary: Nicholas Weiss is a 69 y.o. male with medical history significant of hypertension, hyperlipidemia, type I diabetes mellitus, DKA, CKD stage IIIa, who presents with hypoglycemia.  Per report, pt was noted to hypoglycemia with blood sugar 45. Pt states that he took 40 units of Tresiba yesterday morning due to blood sugar being 227.Pt was given D10and orange juice. Patient was noted to be hypertensive and tachycardic. Of note, this was the second time in the last 24 hours. EMS presented to the patient's home yesterday morning at about 7:00 at which point the patient's glucose was noted to be 42. Patient was given glucose and advised to be transported to the emergency department, however the patient refused. Reportedly, pt was unresponsive initially. Pt was started on D5-1/2 NS in ED. His mental status improved quickly. When I saw pt in ED, he is alert, and orientated x4.  He moves all extremities normally.  No unilateral weakness, numbness or tingling his extremities.  Patient denies chest pain, cough, shortness breath, fever or chills.  No nausea, vomiting, diarrhea, abdominal pain, symptoms of UTI.  1/29: Patient seen and examined.  Sitting up in bed.  Alert and oriented x3.  Tolerating p.o. diet.  Stable for discharge home at this time.  I have discussed with patient and made following recommendations.  He is currently on 40 units of Tresiba and has had at least 2 episodes of  nocturnal hypoglycemia.  I explained this medication is very long-acting insulin and that he should likely reduce the dose in the immediate post discharge time and discussed change with his primary care physician/prescribing provider.  I recommend he drop the dose down to 20 units of [name] daily.  He was also complaining of some neuropathy likely associated with his poorly controlled diabetes.  I recommended increasing his gabapentin to 300 mg twice daily and again to discuss this change with his primary care physician.  Patient expressed understanding of all instructions.  All questions were answered.  Patient stable for discharge home  Discharge Diagnoses:  Principal Problem:   Hypoglycemia Active Problems:   Type 1 diabetes mellitus with diabetic neuropathy, unspecified (HCC)   Hypercholesterolemia   Essential (primary) hypertension   CKD (chronic kidney disease) stage 3, GFR 30-59 ml/min   Unresponsiveness   Hypoglycemia most likely due to inappropriate use of insulin, or continuation of insulin with decreased oral intake.   Patient previously on 50 units of Tresiba and was decreased to 40 units Episodes of hypoglycemia appear to occur at night Recommend dropping the [name] dose down to 20 units daily on discharge Recommend discussing with primary care physician  Type 1 diabetes mellitus with diabetic neuropathy, unspecified and with complication of CKD-IIIa:  Most recent A1c 12.0, poorly controled.  Patient is taking Novolin and Tresiba at home See above for [name] recommendations Increase gabapentin dose to 300 mg twice daily   Hypercholesterolemia: -Pravastatin  Essential (primary) hypertension: -Continue home medications: Amlodipine   CKD (chronic kidney disease) stage 3a, GFR 30-59 ml/min:  Baseline creatinine 1.4-2.0 recently.  His creatinine is 1.62, BUN 23, renal function close to baseline Outpatient follow-up  Unresponsiveness:  Mental status is back to  baseline.   Patient is alert and oriented x4.   No focal neurologic findings physical examination.   Most likely due to hypoglycemia.  Pt has positive UDS for cocaine metabolites, which may have contributed partially.    Discharge Instructions  Discharge Instructions    Diet - low sodium heart healthy   Complete by: As directed    Increase activity slowly   Complete by: As directed      Allergies as of 04/28/2019      Reactions   Penicillins Other (See Comments)   Syncope  Has patient had a PCN reaction causing immediate rash, facial/tongue/throat swelling, SOB or lightheadedness with hypotension: No Has patient had a PCN reaction causing severe rash involving mucus membranes or skin necrosis: No Has patient had a PCN reaction that required hospitalization No Has patient had a PCN reaction occurring within the last 10 years: No If all of the above answers are "NO", then may proceed with Cephalosporin use.      Medication List    TAKE these medications   amLODipine 10 MG tablet Commonly known as: NORVASC Take 1 tablet (10 mg total) by mouth daily.   aspirin 81 MG chewable tablet Chew 1 tablet by mouth daily.   gabapentin 300 MG capsule Commonly known as: NEURONTIN Take 1 capsule (300 mg total) by mouth 2 (two) times daily. PLEASE DISREGARD PREVIOUS PRESCRIPTION FOR THAT WAS FOR 30 TABLETS What changed: when to take this   insulin regular 100 units/mL injection Commonly known as: NovoLIN R ReliOn Inject 0.04-0.12 mLs (4-12 Units total) into the skin 3 (three) times daily before meals. According to sliding scale   lovastatin 40 MG tablet Commonly known as: MEVACOR Take 1 tablet (40 mg total) by mouth at bedtime.   [name] FlexTouch 100 UNIT/ML Sopn FlexTouch Pen Generic drug: insulin degludec 20 units once a day, increase as directed to keep fasting glucose <120 What changed: additional instructions       Allergies  Allergen Reactions  . Penicillins Other (See  Comments)    Syncope  Has patient had a PCN reaction causing immediate rash, facial/tongue/throat swelling, SOB or lightheadedness with hypotension: No Has patient had a PCN reaction causing severe rash involving mucus membranes or skin necrosis: No Has patient had a PCN reaction that required hospitalization No Has patient had a PCN reaction occurring within the last 10 years: No If all of the above answers are "NO", then may proceed with Cephalosporin use.     Consultations:  None   Procedures/Studies: DG Chest 2 View  Result Date: 04/06/2019 CLINICAL DATA:  Follow-up with nipple marker. EXAM: CHEST - 2 VIEW COMPARISON:  2021.  10/17/2018. FINDINGS: Mediastinum hilar structures normal. No clear-cut nodule noted on today's exam. Nipple shadows are noted. No acute infiltrate. No pleural effusion or pneumothorax. Heart size normal. IMPRESSION: No clearcut nodule noted on today's exam. No acute cardiopulmonary disease. Electronically Signed   By: Marcello Moores  Register   On: 04/06/2019 10:25   CT Head Wo Contrast  Result Date: 04/22/2019 CLINICAL DATA:  Generalized weakness. EXAM: CT HEAD WITHOUT CONTRAST TECHNIQUE: Contiguous axial images were obtained from the base of the skull through the vertex without intravenous contrast. COMPARISON:  03/25/2016 FINDINGS: Brain: There is no evidence for acute hemorrhage, hydrocephalus, mass lesion, or abnormal extra-axial fluid collection. No definite CT evidence for acute infarction. Vascular: No  hyperdense vessel or unexpected calcification. Skull: Normal. Negative for fracture or focal lesion. Sinuses/Orbits: No acute finding. Other: None. IMPRESSION: No acute intracranial abnormality. Electronically Signed   By: Misty Stanley M.D.   On: 04/22/2019 13:39   DG Chest Port 1 View  Result Date: 04/05/2019 CLINICAL DATA:  DKA EXAM: PORTABLE CHEST 1 VIEW COMPARISON:  October 17, 2018 FINDINGS: The heart size is normal. There is no pneumothorax or large pleural  effusion. There is no focal infiltrate. There is a rounded 1.1 cm density overlying the left lower lung zone. Aortic calcifications are noted. IMPRESSION: 1. A 1.1 cm rounded density overlying the left lower lung zone. Follow-up with a nonemergent outpatient two-view chest x-ray with nipple markers is recommended. 2. Aortic atherosclerosis. 3. No acute cardiopulmonary findings. Electronically Signed   By: Constance Holster M.D.   On: 04/05/2019 20:03    (Echo, Carotid, EGD, Colonoscopy, ERCP)    Subjective: Seen and examined on the day of discharge Alert and oriented x4 Blood glucose improved Stable for discharge home  Discharge Exam: Vitals:   04/28/19 0426 04/28/19 0751  BP: (!) 143/92 (!) 160/95  Pulse: 81 82  Resp:  19  Temp:  97.6 F (36.4 C)  SpO2:  99%   Vitals:   04/27/19 1922 04/28/19 0411 04/28/19 0426 04/28/19 0751  BP: (!) 152/94 (!) 159/101 (!) 143/92 (!) 160/95  Pulse: 100 82 81 82  Resp: 20 16  19   Temp: 99.5 F (37.5 C) 98.3 F (36.8 C)  97.6 F (36.4 C)  TempSrc: Oral Oral  Oral  SpO2: 100% 100%  99%  Weight:   74.5 kg   Height:        General: Pt is alert, awake, not in acute distress Cardiovascular: RRR, S1/S2 +, no rubs, no gallops Respiratory: CTA bilaterally, no wheezing, no rhonchi Abdominal: Soft, NT, ND, bowel sounds + Extremities: no edema, no cyanosis    The results of significant diagnostics from this hospitalization (including imaging, microbiology, ancillary and laboratory) are listed below for reference.     Microbiology: Recent Results (from the past 240 hour(s))  SARS CORONAVIRUS 2 (TAT 6-24 HRS) Nasopharyngeal     Status: None   Collection Time: 04/27/19  5:33 AM   Specimen: Nasopharyngeal  Result Value Ref Range Status   SARS Coronavirus 2 NEGATIVE NEGATIVE Final    Comment: (NOTE) SARS-CoV-2 target nucleic acids are NOT DETECTED. The SARS-CoV-2 RNA is generally detectable in upper and lower respiratory specimens during the  acute phase of infection. Negative results do not preclude SARS-CoV-2 infection, do not rule out co-infections with other pathogens, and should not be used as the sole basis for treatment or other patient management decisions. Negative results must be combined with clinical observations, patient history, and epidemiological information. The expected result is Negative. Fact Sheet for Patients: SugarRoll.be Fact Sheet for Healthcare Providers: https://www.woods-mathews.com/ This test is not yet approved or cleared by the [place] FDA and  has been authorized for detection and/or diagnosis of SARS-CoV-2 by FDA under an Emergency Use Authorization (EUA). This EUA will remain  in effect (meaning this test can be used) for the duration of the COVID-19 declaration under Section 56 4(b)(1) of the Act, 21 U.S.C. section 360bbb-3(b)(1), unless the authorization is terminated or revoked sooner. Performed at [facility], [place] [address], [place], [place] [address]      Labs: BNP (last 3 results) Recent Labs    08/02/18 1042  BNP 100.7*   Basic  Metabolic Panel: Recent Labs  Lab 04/22/19 0925 04/25/19 1112 04/27/19 0434 04/28/19 0659  NA 141 138 134* 138  K 4.1 4.1 4.0 4.5  CL 105 104 99 107  CO2 27 25 25 25   GLUCOSE 108* 183* 213* 147*  BUN 18 25* 23 20  CREATININE 1.44* 1.66* 1.62* 1.26*  CALCIUM 8.8* 8.8* 8.6* 8.3*   Liver Function Tests: Recent Labs  Lab 04/27/19 0434  AST 25  ALT 20  ALKPHOS 46  BILITOT 0.5  PROT 8.1  ALBUMIN 3.4*   No results for input(s): LIPASE, AMYLASE in the last 168 hours. No results for input(s): AMMONIA in the last 168 hours. CBC: Recent Labs  Lab 04/22/19 0925 04/25/19 1112 04/27/19 0434 04/28/19 0659  WBC 5.1 5.3 5.9 3.4*  NEUTROABS  --  4.2  --   --   HGB 9.7* 10.0* 9.2* 9.6*  HCT 30.6* 30.8* 28.9* 29.1*  MCV 94.7 95.1 95.1 93.0  PLT 356 332 267 245   Cardiac Enzymes: No  results for input(s): CKTOTAL, CKMB, CKMBINDEX, TROPONINI in the last 168 hours. BNP: Invalid input(s): POCBNP CBG: Recent Labs  Lab 04/27/19 2013 04/27/19 2108 04/28/19 0013 04/28/19 0408 04/28/19 0752  GLUCAP 259* 300* 102* 100* 142*   D-Dimer No results for input(s): DDIMER in the last 72 hours. Hgb A1c No results for input(s): HGBA1C in the last 72 hours. Lipid Profile No results for input(s): CHOL, HDL, LDLCALC, TRIG, CHOLHDL, LDLDIRECT in the last 72 hours. Thyroid function studies No results for input(s): TSH, T4TOTAL, T3FREE, THYROIDAB in the last 72 hours.  Invalid input(s): FREET3 Anemia work up No results for input(s): VITAMINB12, FOLATE, FERRITIN, TIBC, IRON, RETICCTPCT in the last 72 hours. Urinalysis    Component Value Date/Time   COLORURINE COLORLESS (A) 04/27/2019 0533   APPEARANCEUR CLEAR (A) 04/27/2019 0533   LABSPEC 1.003 (L) 04/27/2019 0533   PHURINE 6.0 04/27/2019 0533   GLUCOSEU >=500 (A) 04/27/2019 0533   HGBUR NEGATIVE 04/27/2019 0533   BILIRUBINUR NEGATIVE 04/27/2019 0533   KETONESUR NEGATIVE 04/27/2019 0533   PROTEINUR NEGATIVE 04/27/2019 0533   NITRITE NEGATIVE 04/27/2019 0533   LEUKOCYTESUR NEGATIVE 04/27/2019 0533   Sepsis Labs Invalid input(s): PROCALCITONIN,  WBC,  LACTICIDVEN Microbiology Recent Results (from the past 240 hour(s))  SARS CORONAVIRUS 2 (TAT 6-24 HRS) Nasopharyngeal     Status: None   Collection Time: 04/27/19  5:33 AM   Specimen: Nasopharyngeal  Result Value Ref Range Status   SARS Coronavirus 2 NEGATIVE NEGATIVE Final    Comment: (NOTE) SARS-CoV-2 target nucleic acids are NOT DETECTED. The SARS-CoV-2 RNA is generally detectable in upper and lower respiratory specimens during the acute phase of infection. Negative results do not preclude SARS-CoV-2 infection, do not rule out co-infections with other pathogens, and should not be used as the sole basis for treatment or other patient management decisions. Negative  results must be combined with clinical observations, patient history, and epidemiological information. The expected result is Negative. Fact Sheet for Patients: SugarRoll.be Fact Sheet for Healthcare Providers: https://www.woods-mathews.com/ This test is not yet approved or cleared by the [place] FDA and  has been authorized for detection and/or diagnosis of SARS-CoV-2 by FDA under an Emergency Use Authorization (EUA). This EUA will remain  in effect (meaning this test can be used) for the duration of the COVID-19 declaration under Section 56 4(b)(1) of the Act, 21 U.S.C. section 360bbb-3(b)(1), unless the authorization is terminated or revoked sooner. Performed at [facility], [place]  [address] [place],  [address]      Time coordinating discharge: Over 30 minutes  SIGNED:   Sidney Ace, MD  [facility] 04/28/2019, 11:13 AM Pager   If 7PM-7AM, please contact night-coverage [email or web address] Password [email or web address]

## 2019-04-28 NOTE — Telephone Encounter (Signed)
Copied from [place] [id number]. Topic: General - Inquiry >> Apr 28, 2019 11:53 AM Mathis Bud wrote: Reason for CRM: Patient states he missed a call from office.  Patient is being discharged from [facility] today.  Did not find in chart why he was called. Call back [phone]

## 2019-04-28 NOTE — Progress Notes (Signed)
Discharge instructions explained to pt/ verbalized an understanding/ iv and tele removed/ will transport off unit via wheelchair.  

## 2019-05-05 DIAGNOSIS — E1042 Type 1 diabetes mellitus with diabetic polyneuropathy: Secondary | ICD-10-CM | POA: Diagnosis not present

## 2019-05-10 ENCOUNTER — Ambulatory Visit (INDEPENDENT_AMBULATORY_CARE_PROVIDER_SITE_OTHER): Payer: Medicare HMO | Admitting: Family Medicine

## 2019-05-10 ENCOUNTER — Encounter: Payer: Self-pay | Admitting: Family Medicine

## 2019-05-10 ENCOUNTER — Other Ambulatory Visit: Payer: Self-pay

## 2019-05-10 VITALS — BP 133/83 | HR 83 | Temp 98.2°F | Wt 174.0 lb

## 2019-05-10 DIAGNOSIS — E1040 Type 1 diabetes mellitus with diabetic neuropathy, unspecified: Secondary | ICD-10-CM | POA: Diagnosis not present

## 2019-05-10 DIAGNOSIS — I10 Essential (primary) hypertension: Secondary | ICD-10-CM | POA: Diagnosis not present

## 2019-05-10 DIAGNOSIS — E1149 Type 2 diabetes mellitus with other diabetic neurological complication: Secondary | ICD-10-CM

## 2019-05-10 DIAGNOSIS — E1065 Type 1 diabetes mellitus with hyperglycemia: Secondary | ICD-10-CM | POA: Diagnosis not present

## 2019-05-10 NOTE — Patient Instructions (Signed)
.   Please review the attached list of medications and notify my office if there are any errors.   . Please bring all of your medications to every appointment so we can make sure that our medication list is the same as yours.    Start taking gabapentin TWICE daily, once in the morning and once at night, to help with nerve pain in you feet.

## 2019-05-10 NOTE — Progress Notes (Signed)
Patient: Nicholas Weiss Male    DOB: 06-29-1950   69 y.o.   MRN: [id number] Visit Date: 05/10/2019  Today's Provider: Lelon Huh, MD   Chief Complaint  Patient presents with  . Diabetes   Subjective:     HPI  Diabetes Mellitus Type II, Follow-up:   Lab Results  Component Value Date   HGBA1C 12.0 (H) 04/05/2019   HGBA1C 11.7 (A) 04/04/2019   HGBA1C 12.8 (A) 12/19/2018    Last seen for diabetes 1 months ago.  Management since then includes patient advised to take insulin consistently. Patient reports Endo reduced [name] to 30 IU. He reports fair compliance with treatment. He is not having side effects.  Current symptoms include none  Home blood sugar records: fasting range: 339 this AM  Episodes of hypoglycemia? no   Current insulin regiment: Tresiba and Novolin Most Recent Eye Exam: not UTD Endo referred patient to [facility] on 05/09/2019 Weight trend: stable Current exercise: walking Current diet habits: in general, an "unhealthy" diet  Pertinent Labs:    Component Value Date/Time   CHOL 138 12/19/2018 1036   TRIG 70 12/19/2018 1036   HDL 46 12/19/2018 1036   LDLCALC 78 12/19/2018 1036   CREATININE 1.26 (H) 04/28/2019 0659   CREATININE 1.36 (H) 10/11/2012 0542    Wt Readings from Last 3 Encounters:  05/10/19 174 lb (78.9 kg)  04/28/19 164 lb 3.2 oz (74.5 kg)  04/26/19 170 lb (77.1 kg)   He did establish with Dr. Honor Junes on 05/05/2019 and reduced dose of Tresiba due to hypoglycemia. He has not had any significant hypoglycemic episodes since then. He is working on getting CGM system.   ------------------------------------------------------------------------  Allergies  Allergen Reactions  . Penicillins Other (See Comments)    Syncope  Has patient had a PCN reaction causing immediate rash, facial/tongue/throat swelling, SOB or lightheadedness with hypotension: No Has patient had a PCN reaction causing severe rash involving mucus  membranes or skin necrosis: No Has patient had a PCN reaction that required hospitalization No Has patient had a PCN reaction occurring within the last 10 years: No If all of the above answers are "NO", then may proceed with Cephalosporin use.      Current Outpatient Medications:  .  amLODipine (NORVASC) 10 MG tablet, Take 1 tablet (10 mg total) by mouth daily., Disp: 30 tablet, Rfl: 1 .  aspirin 81 MG chewable tablet, Chew 1 tablet by mouth daily., Disp: , Rfl:  .  gabapentin (NEURONTIN) 300 MG capsule, Take 1 capsule (300 mg total) by mouth 2 (two) times daily. PLEASE DISREGARD PREVIOUS PRESCRIPTION FOR THAT WAS FOR 30 TABLETS, Disp: 90 capsule, Rfl: 3 .  insulin degludec (TRESIBA FLEXTOUCH) 100 UNIT/ML SOPN FlexTouch Pen, 20 units once a day, increase as directed to keep fasting glucose <120, Disp: 5 pen, Rfl: 12 .  insulin regular (NOVOLIN R RELION) 100 units/mL injection, Inject 0.04-0.12 mLs (4-12 Units total) into the skin 3 (three) times daily before meals. According to sliding scale, Disp: 10 mL, Rfl: 11 .  lovastatin (MEVACOR) 40 MG tablet, Take 1 tablet (40 mg total) by mouth at bedtime., Disp: 90 tablet, Rfl: 4  Review of Systems  Constitutional: Negative.   Respiratory: Negative.   Cardiovascular: Negative.   Endocrine: Negative.   Musculoskeletal: Negative.     Social History   Tobacco Use  . Smoking status: Never Smoker  . Smokeless tobacco: Never Used  Substance Use Topics  . Alcohol  use: Not Currently    Alcohol/week: 1.0 standard drinks    Types: 1 Cans of beer per week    Comment: Months since last ETOH consumptoin 04/22/2019      Objective:   BP 133/83 (BP Location: Right Arm, Patient Position: Sitting, Cuff Size: Normal)   Pulse 83   Temp 98.2 F (36.8 C) (Temporal)   Wt 174 lb (78.9 kg)   BMI 24.61 kg/m  Vitals:   05/10/19 0949  BP: 133/83  Pulse: 83  Temp: 98.2 F (36.8 C)  TempSrc: Temporal  Weight: 174 lb (78.9 kg)  Body mass index is 24.61  kg/m.   Physical Exam   General: Appearance:    Well developed, well nourished male in no acute distress  Eyes:    PERRL, conjunctiva/corneas clear, EOM's intact       Lungs:     Clear to auscultation bilaterally, respirations unlabored  Heart:    Normal heart rate. Normal rhythm. No murmurs, rubs, or gallops.   MS:   All extremities are intact.   Neurologic:   Awake, alert, oriented x 3. No apparent focal neurological           defect.            Assessment & Plan    1. Uncontrolled type 1 diabetes mellitus with hyperglycemia (HCC) Recurrent hypoglycemic episodes since he has been able to get his basal insulin covered by insurance and now taking consistently. Improved since reducing dose of Tresiba. Is to follow up insulin regiment as instructed by Dr. Tildon Husky.   2. Type 1 diabetes mellitus with diabetic neuropathy, unspecified ([place]) Is now taking Gabapentin twice daily which is effective, he will need refill at the end of this month.   3. Essential (primary) hypertension Well controlled.  Continue current medications.        Lelon Huh, MD  [facility]

## 2019-05-19 MED ORDER — GABAPENTIN 300 MG PO CAPS: 300.0000 mg | ORAL_CAPSULE | Freq: Two times a day (BID) | ORAL | 3 refills | Status: DC

## 2019-05-23 DIAGNOSIS — E113393 Type 2 diabetes mellitus with moderate nonproliferative diabetic retinopathy without macular edema, bilateral: Secondary | ICD-10-CM | POA: Diagnosis not present

## 2019-05-23 LAB — HM DIABETES EYE EXAM

## 2019-06-02 DIAGNOSIS — Z20828 Contact with and (suspected) exposure to other viral communicable diseases: Secondary | ICD-10-CM | POA: Diagnosis not present

## 2019-07-14 DIAGNOSIS — Z20828 Contact with and (suspected) exposure to other viral communicable diseases: Secondary | ICD-10-CM | POA: Diagnosis not present

## 2019-07-27 ENCOUNTER — Inpatient Hospital Stay
Admission: EM | Admit: 2019-07-27 | Discharge: 2019-07-29 | DRG: 638 | Disposition: A | Discharge status: Home / Self Care | Payer: Medicare HMO | Attending: Internal Medicine | Admitting: Internal Medicine

## 2019-07-27 ENCOUNTER — Encounter: Payer: Self-pay | Admitting: Emergency Medicine

## 2019-07-27 ENCOUNTER — Emergency Department: Payer: Medicare HMO

## 2019-07-27 DIAGNOSIS — E1140 Type 2 diabetes mellitus with diabetic neuropathy, unspecified: Secondary | ICD-10-CM | POA: Diagnosis present

## 2019-07-27 DIAGNOSIS — I129 Hypertensive chronic kidney disease with stage 1 through stage 4 chronic kidney disease, or unspecified chronic kidney disease: Secondary | ICD-10-CM | POA: Diagnosis present

## 2019-07-27 DIAGNOSIS — Z79899 Other long term (current) drug therapy: Secondary | ICD-10-CM

## 2019-07-27 DIAGNOSIS — Z20822 Contact with and (suspected) exposure to covid-19: Secondary | ICD-10-CM | POA: Diagnosis not present

## 2019-07-27 DIAGNOSIS — F1410 Cocaine abuse, uncomplicated: Secondary | ICD-10-CM | POA: Diagnosis present

## 2019-07-27 DIAGNOSIS — R531 Weakness: Secondary | ICD-10-CM

## 2019-07-27 DIAGNOSIS — Z880 Allergy status to penicillin: Secondary | ICD-10-CM

## 2019-07-27 DIAGNOSIS — I10 Essential (primary) hypertension: Secondary | ICD-10-CM | POA: Diagnosis present

## 2019-07-27 DIAGNOSIS — Z794 Long term (current) use of insulin: Secondary | ICD-10-CM

## 2019-07-27 DIAGNOSIS — E1165 Type 2 diabetes mellitus with hyperglycemia: Secondary | ICD-10-CM | POA: Diagnosis not present

## 2019-07-27 DIAGNOSIS — Z9181 History of falling: Secondary | ICD-10-CM

## 2019-07-27 DIAGNOSIS — R0602 Shortness of breath: Secondary | ICD-10-CM | POA: Diagnosis not present

## 2019-07-27 DIAGNOSIS — E1122 Type 2 diabetes mellitus with diabetic chronic kidney disease: Secondary | ICD-10-CM | POA: Diagnosis present

## 2019-07-27 DIAGNOSIS — F1210 Cannabis abuse, uncomplicated: Secondary | ICD-10-CM | POA: Diagnosis present

## 2019-07-27 DIAGNOSIS — R61 Generalized hyperhidrosis: Secondary | ICD-10-CM | POA: Diagnosis not present

## 2019-07-27 DIAGNOSIS — Z8249 Family history of ischemic heart disease and other diseases of the circulatory system: Secondary | ICD-10-CM

## 2019-07-27 DIAGNOSIS — R739 Hyperglycemia, unspecified: Secondary | ICD-10-CM

## 2019-07-27 DIAGNOSIS — N1831 Chronic kidney disease, stage 3a: Secondary | ICD-10-CM | POA: Diagnosis present

## 2019-07-27 DIAGNOSIS — N183 Chronic kidney disease, stage 3 unspecified: Secondary | ICD-10-CM | POA: Diagnosis present

## 2019-07-27 DIAGNOSIS — E7800 Pure hypercholesterolemia, unspecified: Secondary | ICD-10-CM | POA: Diagnosis present

## 2019-07-27 DIAGNOSIS — Z7982 Long term (current) use of aspirin: Secondary | ICD-10-CM

## 2019-07-27 DIAGNOSIS — E872 Acidosis: Secondary | ICD-10-CM | POA: Diagnosis present

## 2019-07-27 DIAGNOSIS — N179 Acute kidney failure, unspecified: Secondary | ICD-10-CM | POA: Diagnosis present

## 2019-07-27 LAB — BLOOD GAS, VENOUS
Acid-base deficit: 2.4 mmol/L — ABNORMAL HIGH (ref 0.0–2.0)
Bicarbonate: 23.7 mmol/L (ref 20.0–28.0)
FIO2: 0.21
O2 Saturation: 20.6 %
Patient temperature: 37
pCO2, Ven: 45 mmHg (ref 44.0–60.0)
pH, Ven: 7.33 (ref 7.250–7.430)
pO2, Ven: <31 mmHg — CL (ref 32.0–45.0)

## 2019-07-27 LAB — BASIC METABOLIC PANEL
Anion gap: 15 (ref 5–15)
BUN: 40 mg/dL — ABNORMAL HIGH (ref 8–23)
CO2: 21 mmol/L — ABNORMAL LOW (ref 22–32)
Calcium: 9.3 mg/dL (ref 8.9–10.3)
Chloride: 102 mmol/L (ref 98–111)
Creatinine, Ser: 3.05 mg/dL — ABNORMAL HIGH (ref 0.61–1.24)
GFR calc Af Amer: 24 mL/min — ABNORMAL LOW (ref >60)
GFR calc non Af Amer: 20 mL/min — ABNORMAL LOW (ref >60)
Glucose, Bld: 449 mg/dL — ABNORMAL HIGH (ref 70–99)
Potassium: 4.8 mmol/L (ref 3.5–5.1)
Sodium: 138 mmol/L (ref 135–145)

## 2019-07-27 LAB — HEPATIC FUNCTION PANEL
ALT: 15 U/L (ref 0–44)
AST: 18 U/L (ref 15–41)
Albumin: 3.4 g/dL — ABNORMAL LOW (ref 3.5–5.0)
Alkaline Phosphatase: 61 U/L (ref 38–126)
Bilirubin, Direct: 0.1 mg/dL (ref 0.0–0.2)
Indirect Bilirubin: 0.9 mg/dL (ref 0.3–0.9)
Total Bilirubin: 1 mg/dL (ref 0.3–1.2)
Total Protein: 8.7 g/dL — ABNORMAL HIGH (ref 6.5–8.1)

## 2019-07-27 LAB — GLUCOSE, CAPILLARY: Glucose-Capillary: 449 mg/dL — ABNORMAL HIGH (ref 70–99)

## 2019-07-27 LAB — CBC
HCT: 32.4 % — ABNORMAL LOW (ref 39.0–52.0)
Hemoglobin: 11.1 g/dL — ABNORMAL LOW (ref 13.0–17.0)
MCH: 30.7 pg (ref 26.0–34.0)
MCHC: 34.3 g/dL (ref 30.0–36.0)
MCV: 89.5 fL (ref 80.0–100.0)
Platelets: 288 10*3/uL (ref 150–400)
RBC: 3.62 MIL/uL — ABNORMAL LOW (ref 4.22–5.81)
RDW: 12.4 % (ref 11.5–15.5)
WBC: 5.5 10*3/uL (ref 4.0–10.5)
nRBC: 0 % (ref 0.0–0.2)

## 2019-07-27 LAB — BETA-HYDROXYBUTYRIC ACID: Beta-Hydroxybutyric Acid: 2.56 mmol/L — ABNORMAL HIGH (ref 0.05–0.27)

## 2019-07-27 LAB — MAGNESIUM: Magnesium: 2.5 mg/dL — ABNORMAL HIGH (ref 1.7–2.4)

## 2019-07-27 LAB — LACTIC ACID, PLASMA: Lactic Acid, Venous: 3.7 mmol/L (ref 0.5–1.9)

## 2019-07-27 LAB — TROPONIN I (HIGH SENSITIVITY): Troponin I (High Sensitivity): 39 ng/L — ABNORMAL HIGH (ref <18)

## 2019-07-27 IMAGING — DX DG CHEST 1V PORT
1 series · 1 of 1 positions shown · non-contrast
Comparison: [DATE]

CLINICAL DATA: Shortness of breath and weakness

EXAM:
PORTABLE CHEST 1 VIEW

[chest ap]
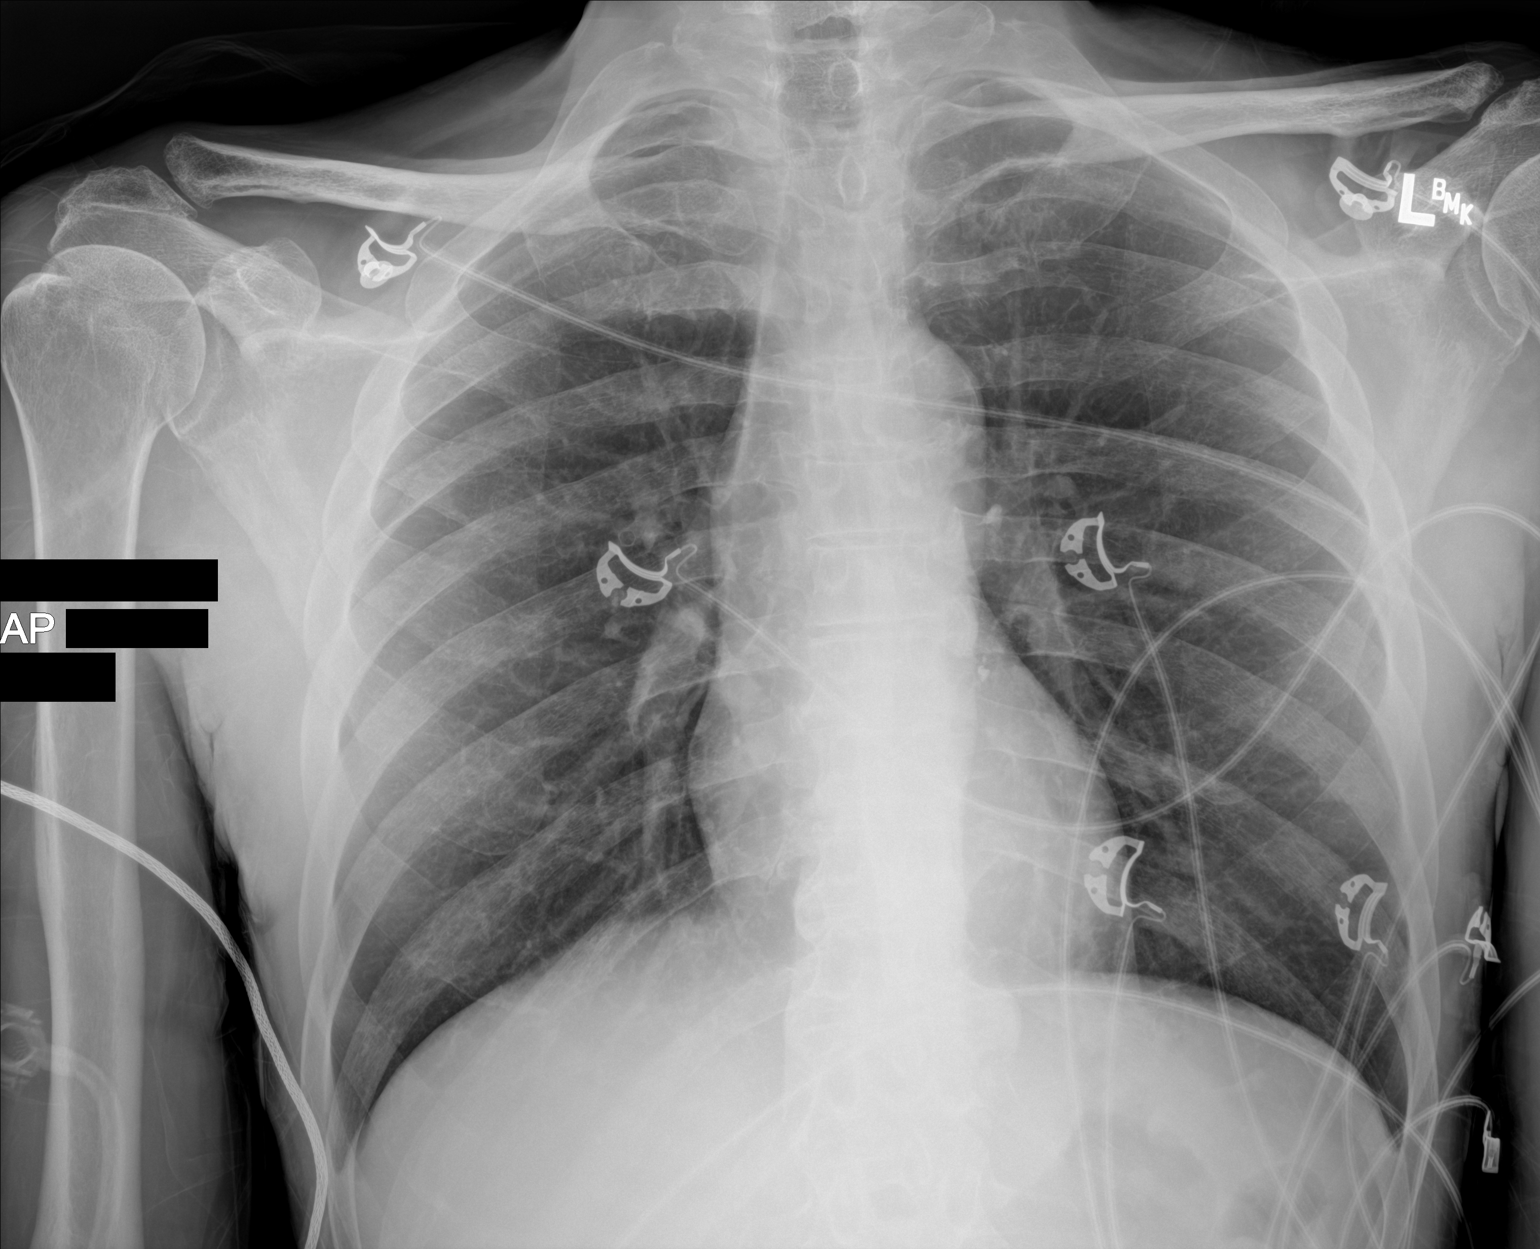

[1 of 1 positions shown; findings below may reference images not displayed]

FINDINGS: Cardiac shadow is within normal limits. Aortic calcifications are
noted. The lungs are well aerated bilaterally. No focal infiltrate
or sizable effusion is seen. No bony abnormality is noted.
IMPRESSION: No acute abnormality noted.

Aortic Atherosclerosis ([6I]-[6I]).

## 2019-07-27 MED ORDER — SODIUM CHLORIDE 0.9 % IV BOLUS
1000.0000 mL | Freq: Once | INTRAVENOUS | Status: AC
  Administered 2019-07-28: 1000 mL via INTRAVENOUS

## 2019-07-27 MED ORDER — SODIUM CHLORIDE 0.9 % IV BOLUS
1000.0000 mL | Freq: Once | INTRAVENOUS | Status: AC
  Administered 2019-07-27: 1000 mL via INTRAVENOUS

## 2019-07-27 NOTE — ED Triage Notes (Signed)
Pt arrived via EMS from home where pt has had increased weakness and hyperglycemia x 24 hours. Pt denies fever and SOB. Pt reports he took 20 units insulin approx 1500 today.

## 2019-07-27 NOTE — ED Provider Notes (Signed)
[facility] Emergency Department Provider Note  ____________________________________________   First MD Initiated Contact with Patient 07/27/19 2221     (approximate)  I have reviewed the triage vital signs and the nursing notes.   HISTORY  Chief Complaint Weakness and Hyperglycemia    HPI Nicholas Weiss is a 69 y.o. male with history of DKA, hypertension, hypercholesterol who comes in for weakness, hyperglycemia.  Patient stated that he does not feel well last night and did not sleep well so he did not take his normal insulin.  He stated that he just felt really weak all over.  He noted that his sugars were elevated.  His weakness is moderate, constant, nothing makes it better, nothing makes it worse.  Denies any chest pain, urinary symptoms, abdominal pain, chest pain.  He states that he might have taken some insulin afterwards but really hard to give exact confirmation on this.  He had some low-grade temperatures here but denies knowing he had any fevers.          Past Medical History:  Diagnosis Date  . DKA (diabetic ketoacidoses) ([place]) 04/06/2016  . Hypercholesteremia   . Hypertension     Patient Active Problem List   Diagnosis Date Noted  . Hypoglycemia 04/27/2019  . Unresponsiveness 04/27/2019  . Lung nodule 04/07/2019  . Uncontrolled type 1 diabetes mellitus ([place])   . Acute renal failure with acute tubular necrosis superimposed on stage 3a chronic kidney disease ([place])   . Hypertensive urgency 04/05/2019  . AKI (acute kidney injury) ([place]) 04/05/2019  . CKD (chronic kidney disease) stage 3, GFR 30-59 ml/min 04/05/2019  . DKA (diabetic ketoacidoses) ([place]) 04/06/2016  . Hypercholesterolemia 01/24/2016  . Essential (primary) hypertension 06/07/2006  . Type 1 diabetes mellitus with diabetic neuropathy, unspecified ([place]) 05/31/2006    Past Surgical History:  Procedure Laterality Date  . KNEE SURGERY Right    Torn meniscus  . KNEE SURGERY  Left     Prior to Admission medications   Medication Sig Start Date End Date Taking? Authorizing Provider  amLODipine (NORVASC) 10 MG tablet Take 1 tablet (10 mg total) by mouth daily. 04/07/19   Dhungel, Flonnie Overman, MD  aspirin 81 MG chewable tablet Chew 1 tablet by mouth daily.    [provider]  gabapentin (NEURONTIN) 300 MG capsule Take 1 capsule (300 mg total) by mouth 2 (two) times daily. 05/19/19   Birdie Sons, MD  insulin degludec (TRESIBA FLEXTOUCH) 100 UNIT/ML SOPN FlexTouch Pen 20 units once a day, increase as directed to keep fasting glucose <120 Patient taking differently: Inject 30 Units into the skin daily.  04/28/19   Sreenath, Sudheer B, MD  insulin regular (NOVOLIN R RELION) 100 units/mL injection Inject 0.04-0.12 mLs (4-12 Units total) into the skin 3 (three) times daily before meals. According to sliding scale 01/03/18   Birdie Sons, MD  lovastatin (MEVACOR) 40 MG tablet Take 1 tablet (40 mg total) by mouth at bedtime. 08/02/18   Birdie Sons, MD    Allergies Penicillins  Family History  Problem Relation Age of Onset  . Heart attack Father   . Hypertension Sister   . Cancer Sister     Social History Social History   Tobacco Use  . Smoking status: Never Smoker  . Smokeless tobacco: Never Used  Substance Use Topics  . Alcohol use: Not Currently    Alcohol/week: 1.0 standard drinks    Types: 1 Cans of beer per week  Comment: Months since last ETOH consumptoin 04/22/2019  . Drug use: Yes    Types: Marijuana    Comment: Last used 04/01/2019      Review of Systems Constitutional: No fever/chills positive weakness, high sugars Eyes: No visual changes. ENT: No sore throat. Cardiovascular: Denies chest pain. Respiratory: Denies shortness of breath. Gastrointestinal: No abdominal pain.  No nausea, no vomiting.  No diarrhea.  No constipation. Genitourinary: Negative for dysuria. Musculoskeletal: Negative for back pain. Skin: Negative for  rash. Neurological: Negative for headaches, focal weakness or numbness. All other ROS negative ____________________________________________   PHYSICAL EXAM:  VITAL SIGNS: ED Triage Vitals  Enc Vitals Group     BP 07/27/19 2219 105/84     Pulse Rate 07/27/19 2219 (!) 103     Resp 07/27/19 2219 13     Temp 07/27/19 2219 99.2 F (37.3 C)     Temp Source 07/27/19 2219 Oral     SpO2 07/27/19 2219 100 %     Weight 07/27/19 2220 156 lb 15.5 oz (71.2 kg)     Height --      Head Circumference --      Peak Flow --      Pain Score --      Pain Loc --      Pain Edu? --      Excl. in [place]? --     Constitutional: Alert and oriented. Well appearing and in no acute distress. Eyes: Conjunctivae are normal. EOMI. Head: Atraumatic. Nose: No congestion/rhinnorhea. Mouth/Throat: Mucous membranes are dry Neck: No stridor. Trachea Midline. FROM Cardiovascular: Tachycardic, regular rhythm. Grossly normal heart sounds.  Good peripheral circulation. Respiratory: Normal respiratory effort.  No retractions. Lungs CTAB. Gastrointestinal: Soft and nontender. No distention. No abdominal bruits.  Musculoskeletal: No lower extremity tenderness nor edema.  No joint effusions. Neurologic:  Normal speech and language. No gross focal neurologic deficits are appreciated.  Skin:  Skin is warm, dry and intact. No rash noted. Psychiatric: Mood and affect are normal. Speech and behavior are normal. GU: Deferred   ____________________________________________   LABS (all labs ordered are listed, but only abnormal results are displayed)  Labs Reviewed  GLUCOSE, CAPILLARY - Abnormal; Notable for the following components:      Result Value   Glucose-Capillary 449 (*)    All other components within normal limits  CULTURE, BLOOD (ROUTINE X 2)  CULTURE, BLOOD (ROUTINE X 2)  RESPIRATORY PANEL BY RT PCR (FLU A&B, COVID)  BASIC METABOLIC PANEL  CBC  URINALYSIS, COMPLETE (UACMP) WITH MICROSCOPIC  LACTIC ACID,  PLASMA  LACTIC ACID, PLASMA  HEPATIC FUNCTION PANEL  BLOOD GAS, VENOUS  BETA-HYDROXYBUTYRIC ACID  CBG MONITORING, ED  CBG MONITORING, ED  TROPONIN I (HIGH SENSITIVITY)   ____________________________________________   ED ECG REPORT I, Vanessa Hammon, the attending physician, personally viewed and interpreted this ECG.  EKG is sinus tachycardia rate of 106, no ST elevation, no T wave inversions, QTC of 576 ____________________________________________  RADIOLOGY Robert Bellow, personally viewed and evaluated these images (plain radiographs) as part of my medical decision making, as well as reviewing the written report by the radiologist.  ED MD interpretation:  No pna  Official radiology report(s): DG Chest Portable 1 View  Result Date: 07/27/2019 CLINICAL DATA:  Shortness of breath and weakness EXAM: PORTABLE CHEST 1 VIEW COMPARISON:  04/06/2019 FINDINGS: Cardiac shadow is within normal limits. Aortic calcifications are noted. The lungs are well aerated bilaterally. No focal infiltrate or sizable effusion is seen.  No bony abnormality is noted. IMPRESSION: No acute abnormality noted. Aortic Atherosclerosis (ICD10-I70.0). Electronically Signed   By: Inez Catalina M.D.   On: 07/27/2019 22:51    ____________________________________________   PROCEDURES  Procedure(s) performed (including Critical Care):  Procedures   ____________________________________________   INITIAL IMPRESSION / ASSESSMENT AND PLAN / ED COURSE  TYNER CODNER was evaluated in Emergency Department on 07/27/2019 for the symptoms described in the history of present illness. He was evaluated in the context of the global COVID-19 pandemic, which necessitated consideration that the patient might be at risk for infection with the SARS-CoV-2 virus that causes COVID-19. Institutional protocols and algorithms that pertain to the evaluation of patients at risk for COVID-19 are in a state of rapid change based on  information released by regulatory bodies including the CDC and federal and state organizations. These policies and algorithms were followed during the patient's care in the ED.    Patient is a 69 year old who comes in with generalized weakness in the setting of high sugars.  Patient was not compliant with his medications due to the weakness and not sleeping well.  Patient does have some low-grade temperatures.  Consider possible infection.  Will get labs evaluate for [place] abnormalities, AKI, DKA, chest x-ray evaluate for pneumonia, UA to evaluate for UTI, Covid swab.  Will give patient fluids.  Patient will be handed off to oncoming team pending labs.       ____________________________________________   FINAL CLINICAL IMPRESSION(S) / ED DIAGNOSES   Final diagnoses:  Hyperglycemia      MEDICATIONS GIVEN DURING THIS VISIT:  Medications  sodium chloride 0.9 % bolus 1,000 mL (has no administration in time range)     ED Discharge Orders    None       Note:  This document was prepared using Dragon voice recognition software and may include unintentional dictation errors.   Vanessa [place]lake, MD 07/27/19 [phone]

## 2019-07-28 ENCOUNTER — Other Ambulatory Visit: Payer: Self-pay

## 2019-07-28 ENCOUNTER — Encounter: Payer: Self-pay | Admitting: Internal Medicine

## 2019-07-28 DIAGNOSIS — N179 Acute kidney failure, unspecified: Secondary | ICD-10-CM

## 2019-07-28 DIAGNOSIS — R519 Headache, unspecified: Secondary | ICD-10-CM | POA: Diagnosis not present

## 2019-07-28 DIAGNOSIS — Z794 Long term (current) use of insulin: Secondary | ICD-10-CM

## 2019-07-28 DIAGNOSIS — E7800 Pure hypercholesterolemia, unspecified: Secondary | ICD-10-CM | POA: Diagnosis not present

## 2019-07-28 DIAGNOSIS — F1410 Cocaine abuse, uncomplicated: Secondary | ICD-10-CM | POA: Diagnosis present

## 2019-07-28 DIAGNOSIS — I10 Essential (primary) hypertension: Secondary | ICD-10-CM | POA: Diagnosis not present

## 2019-07-28 DIAGNOSIS — E1165 Type 2 diabetes mellitus with hyperglycemia: Principal | ICD-10-CM

## 2019-07-28 DIAGNOSIS — Z7982 Long term (current) use of aspirin: Secondary | ICD-10-CM | POA: Diagnosis not present

## 2019-07-28 DIAGNOSIS — E1122 Type 2 diabetes mellitus with diabetic chronic kidney disease: Secondary | ICD-10-CM | POA: Diagnosis not present

## 2019-07-28 DIAGNOSIS — I129 Hypertensive chronic kidney disease with stage 1 through stage 4 chronic kidney disease, or unspecified chronic kidney disease: Secondary | ICD-10-CM | POA: Diagnosis not present

## 2019-07-28 DIAGNOSIS — N1832 Chronic kidney disease, stage 3b: Secondary | ICD-10-CM

## 2019-07-28 DIAGNOSIS — Z79899 Other long term (current) drug therapy: Secondary | ICD-10-CM | POA: Diagnosis not present

## 2019-07-28 DIAGNOSIS — E1140 Type 2 diabetes mellitus with diabetic neuropathy, unspecified: Secondary | ICD-10-CM | POA: Diagnosis not present

## 2019-07-28 DIAGNOSIS — R531 Weakness: Secondary | ICD-10-CM

## 2019-07-28 DIAGNOSIS — Z9181 History of falling: Secondary | ICD-10-CM | POA: Diagnosis not present

## 2019-07-28 DIAGNOSIS — N1831 Chronic kidney disease, stage 3a: Secondary | ICD-10-CM | POA: Diagnosis not present

## 2019-07-28 DIAGNOSIS — S0990XA Unspecified injury of head, initial encounter: Secondary | ICD-10-CM | POA: Diagnosis not present

## 2019-07-28 DIAGNOSIS — R739 Hyperglycemia, unspecified: Secondary | ICD-10-CM

## 2019-07-28 DIAGNOSIS — Z880 Allergy status to penicillin: Secondary | ICD-10-CM | POA: Diagnosis not present

## 2019-07-28 DIAGNOSIS — F1210 Cannabis abuse, uncomplicated: Secondary | ICD-10-CM | POA: Diagnosis present

## 2019-07-28 DIAGNOSIS — Z20822 Contact with and (suspected) exposure to covid-19: Secondary | ICD-10-CM | POA: Diagnosis not present

## 2019-07-28 DIAGNOSIS — R69 Illness, unspecified: Secondary | ICD-10-CM | POA: Diagnosis not present

## 2019-07-28 DIAGNOSIS — Z8249 Family history of ischemic heart disease and other diseases of the circulatory system: Secondary | ICD-10-CM | POA: Diagnosis not present

## 2019-07-28 DIAGNOSIS — E872 Acidosis: Secondary | ICD-10-CM | POA: Diagnosis not present

## 2019-07-28 LAB — GLUCOSE, CAPILLARY
Glucose-Capillary: 320 mg/dL — ABNORMAL HIGH (ref 70–99)
Glucose-Capillary: 505 mg/dL (ref 70–99)
Glucose-Capillary: 421 mg/dL — ABNORMAL HIGH (ref 70–99)
Glucose-Capillary: 395 mg/dL — ABNORMAL HIGH (ref 70–99)
Glucose-Capillary: 329 mg/dL — ABNORMAL HIGH (ref 70–99)
Glucose-Capillary: 155 mg/dL — ABNORMAL HIGH (ref 70–99)
Glucose-Capillary: 126 mg/dL — ABNORMAL HIGH (ref 70–99)

## 2019-07-28 LAB — TROPONIN I (HIGH SENSITIVITY): Troponin I (High Sensitivity): 40 ng/L — ABNORMAL HIGH (ref <18)

## 2019-07-28 LAB — RESPIRATORY PANEL BY RT PCR (FLU A&B, COVID)
Influenza A by PCR: NEGATIVE
Influenza B by PCR: NEGATIVE
SARS Coronavirus 2 by RT PCR: NEGATIVE

## 2019-07-28 LAB — HEMOGLOBIN A1C
Hgb A1c MFr Bld: 12 % — ABNORMAL HIGH (ref 4.8–5.6)
Mean Plasma Glucose: 297.7 mg/dL

## 2019-07-28 LAB — BASIC METABOLIC PANEL
Anion gap: 8 (ref 5–15)
Anion gap: 10 (ref 5–15)
BUN: 39 mg/dL — ABNORMAL HIGH (ref 8–23)
BUN: 43 mg/dL — ABNORMAL HIGH (ref 8–23)
CO2: 22 mmol/L (ref 22–32)
CO2: 20 mmol/L — ABNORMAL LOW (ref 22–32)
Calcium: 8.3 mg/dL — ABNORMAL LOW (ref 8.9–10.3)
Calcium: 8.5 mg/dL — ABNORMAL LOW (ref 8.9–10.3)
Chloride: 110 mmol/L (ref 98–111)
Chloride: 108 mmol/L (ref 98–111)
Creatinine, Ser: 2.58 mg/dL — ABNORMAL HIGH (ref 0.61–1.24)
Creatinine, Ser: 2.62 mg/dL — ABNORMAL HIGH (ref 0.61–1.24)
GFR calc Af Amer: 29 mL/min — ABNORMAL LOW (ref >60)
GFR calc Af Amer: 28 mL/min — ABNORMAL LOW (ref >60)
GFR calc non Af Amer: 25 mL/min — ABNORMAL LOW (ref >60)
GFR calc non Af Amer: 24 mL/min — ABNORMAL LOW (ref >60)
Glucose, Bld: 330 mg/dL — ABNORMAL HIGH (ref 70–99)
Glucose, Bld: 386 mg/dL — ABNORMAL HIGH (ref 70–99)
Potassium: 4.5 mmol/L (ref 3.5–5.1)
Potassium: 4.2 mmol/L (ref 3.5–5.1)
Sodium: 140 mmol/L (ref 135–145)
Sodium: 138 mmol/L (ref 135–145)

## 2019-07-28 LAB — LIPASE, BLOOD: Lipase: 16 U/L (ref 11–51)

## 2019-07-28 LAB — LACTIC ACID, PLASMA: Lactic Acid, Venous: 1.2 mmol/L (ref 0.5–1.9)

## 2019-07-28 MED ORDER — SODIUM CHLORIDE 0.9 % IV SOLN: INTRAVENOUS | Status: DC

## 2019-07-28 MED ORDER — ENOXAPARIN SODIUM 30 MG/0.3ML ~~LOC~~ SOLN
30.0000 mg | SUBCUTANEOUS | Status: DC
  Administered 2019-07-28 – 2019-07-29 (×2): 30 mg via SUBCUTANEOUS
  Filled 2019-07-28 (×2): qty 0.3

## 2019-07-28 MED ORDER — ENSURE MAX PROTEIN PO LIQD
11.0000 [oz_av] | Freq: Two times a day (BID) | ORAL | Status: DC
  Administered 2019-07-29: 237 mL via ORAL
  Filled 2019-07-28: qty 330

## 2019-07-28 MED ORDER — AMLODIPINE BESYLATE 10 MG PO TABS
10.0000 mg | ORAL_TABLET | Freq: Every day | ORAL | Status: DC
  Administered 2019-07-28 – 2019-07-29 (×2): 10 mg via ORAL
  Filled 2019-07-28 (×2): qty 1

## 2019-07-28 MED ORDER — ONDANSETRON HCL 4 MG PO TABS: 4.0000 mg | ORAL_TABLET | Freq: Four times a day (QID) | ORAL | Status: DC | PRN

## 2019-07-28 MED ORDER — INSULIN ASPART 100 UNIT/ML ~~LOC~~ SOLN: 0.0000 [IU] | Freq: Every day | SUBCUTANEOUS | Status: DC

## 2019-07-28 MED ORDER — INSULIN ASPART 100 UNIT/ML ~~LOC~~ SOLN: 0.0000 [IU] | Freq: Three times a day (TID) | SUBCUTANEOUS | Status: DC

## 2019-07-28 MED ORDER — ACETAMINOPHEN 325 MG PO TABS
650.0000 mg | ORAL_TABLET | Freq: Four times a day (QID) | ORAL | Status: DC | PRN
  Administered 2019-07-28: 23:00:00 650 mg via ORAL
  Filled 2019-07-28: qty 2

## 2019-07-28 MED ORDER — TRAMADOL HCL 50 MG PO TABS
50.0000 mg | ORAL_TABLET | Freq: Four times a day (QID) | ORAL | Status: DC | PRN
  Administered 2019-07-28: 23:00:00 50 mg via ORAL
  Filled 2019-07-28: qty 1

## 2019-07-28 MED ORDER — ONDANSETRON HCL 4 MG/2ML IJ SOLN
4.0000 mg | Freq: Four times a day (QID) | INTRAMUSCULAR | Status: DC | PRN
  Administered 2019-07-29: 05:00:00 4 mg via INTRAVENOUS
  Filled 2019-07-28: qty 2

## 2019-07-28 MED ORDER — INSULIN ASPART 100 UNIT/ML ~~LOC~~ SOLN
15.0000 [IU] | Freq: Once | SUBCUTANEOUS | Status: AC
  Administered 2019-07-28: 09:00:00 15 [IU] via SUBCUTANEOUS
  Filled 2019-07-28: qty 1

## 2019-07-28 MED ORDER — INSULIN GLARGINE 100 UNIT/ML ~~LOC~~ SOLN
25.0000 [IU] | Freq: Every day | SUBCUTANEOUS | Status: DC
  Administered 2019-07-28 – 2019-07-29 (×2): 25 [IU] via SUBCUTANEOUS
  Filled 2019-07-28 (×3): qty 0.25

## 2019-07-28 MED ORDER — INSULIN ASPART 100 UNIT/ML ~~LOC~~ SOLN
0.0000 [IU] | Freq: Three times a day (TID) | SUBCUTANEOUS | Status: DC
  Administered 2019-07-28: 15 [IU] via SUBCUTANEOUS
  Administered 2019-07-28: 4 [IU] via SUBCUTANEOUS
  Filled 2019-07-28 (×2): qty 1

## 2019-07-28 NOTE — Progress Notes (Addendum)
Inpatient Diabetes Program Recommendations  AACE/ADA: New Consensus Statement on Inpatient Glycemic Control (2015)  Target Ranges:  Prepandial:   less than 140 mg/dL      Peak postprandial:   less than 180 mg/dL (1-2 hours)      Critically ill patients:  140 - 180 mg/dL   Lab Results  Component Value Date   ZESPQZ 300 (HH) 07/28/2019   HGBA1C 12.0 (H) 04/05/2019    Review of Glycemic Control  Results for JACKSYN, BEEKS (MRN [id number]) as of 07/28/2019 08:55  Ref. Range 07/27/2019 22:16 07/28/2019 02:11 07/28/2019 08:28  Glucose-Capillary Latest Ref Range: 70 - 99 mg/dL 449 (H) 320 (H) 505 (HH)    Diabetes history: DM1(does not make insulin.  Needs correction, basal and meal coverage)  Outpatient Diabetes medications:  Lantus 25 units daily + Novolog 0-20 TID + 0-5 units QHS  Current orders for Inpatient glycemic control: Lantus 30 units daily + Novolog 0-10 SSI  Inpatient Diabetes Program Recommendations:     CBG 505 mg/dl at 0828.  Please consider obtaining a current chemistry panel.  Patient has not had basal insulin in >24 hrs and has type 1 DM; concern for DKA.  Asked RN to administer Lantus as soon as possible.  It is ordered for 10:00.    Also, please consider adding Novolog 3 units meal coverage if eats at least 50%  Addendum @ 1345-Spoke with patient this morning and he was too lethargic to hold a conversation.  Back again to see him this afternoon and he is feeling better.  He states he did not take his [name] yesterday because he was not feeling well.  He then went to work and came to ED.  Educated patient on importance of taking basal insulin/Tresiba everyday even if he is not feeling well.  Explained basal insulin and how it works.  He can hold novolog if he does not eat.  He verbalizes understanding.  He does not have a follow up appointment with Honor Junes.  Asked him to follow up with Honor Junes in 1 week after discharge from hospital.  Reviewed patient's current A1c of  12%. (average 300mg /dl).   Explained what a A1c is and what it measures. Also reviewed goal A1c with patient, importance of good glucose control @ home, and blood sugar goals.  He is aware of symptoms of hypoglycemia and treatment.  Son entered room during conversation and I educated son as well on importance of administering [name] daily any why.     Thank you, Reche Dixon, RN, BSN Diabetes Coordinator Inpatient Diabetes Program [phone] (team pager from 8a-5p)

## 2019-07-28 NOTE — Plan of Care (Signed)
  RD identified need for education regarding diabetes at time of initial assessment.  Lab Results  Component Value Date   HGBA1C 12.0 (H) 07/27/2019    RD provided "Carbohydrate Counting for People with Diabetes" handout from the Academy of Nutrition and Dietetics. Discussed different food groups and their effects on blood sugar, emphasizing carbohydrate-containing foods. Provided list of carbohydrates and recommended serving sizes of common foods.  Discussed importance of controlled and consistent carbohydrate intake throughout the day. Provided examples of ways to balance meals/snacks and encouraged intake of high-fiber, whole grain complex carbohydrates. Teach back method used.  Expect fair to good compliance.  Body mass index is 22.52 kg/m. Pt meets criteria for normal weight based on current BMI.  Current diet order is heart healthy/carbohydrate modified, patient is consuming approximately 75% of meals at this time. Labs and medications reviewed. RD will continue to follow patient in setting of positive Malnutrition Screening Tool (MST) score.  Jacklynn Barnacle, MS, RD, LDN Pager number available on Amion

## 2019-07-28 NOTE — Progress Notes (Signed)
PHARMACIST - PHYSICIAN COMMUNICATION  CONCERNING:  Enoxaparin (Lovenox) for DVT Prophylaxis    RECOMMENDATION: Patient was prescribed enoxaprin 40mg  q24 hours for VTE prophylaxis.   Filed Weights   07/27/19 2220  Weight: 71.2 kg (156 lb 15.5 oz)    Body mass index is 22.52 kg/m.  Estimated Creatinine Clearance: 28.4 mL/min (A) (by C-G formula based on SCr of 2.58 mg/dL (H)).  Patient is candidate for enoxaparin 30mg  every 24 hours based on CrCl <28ml/min or Weight less then 45kg for women or <57kg for men   DESCRIPTION: Pharmacy has adjusted enoxaparin dose per [facility] policy.  Patient is now receiving enoxaparin 30mg  every 24 hours.  Ena Dawley, PharmD Clinical Pharmacist  07/28/2019 6:27 AM

## 2019-07-28 NOTE — Progress Notes (Signed)
Spoke to MD regarding patient lethargy and MD ordered BMP to check anion gap.

## 2019-07-28 NOTE — Progress Notes (Signed)
Call to Dr Mal Misty addressing FSBS 505 ml/dl. Per Dr Mal Misty we will administer 15 u short acting insulin.

## 2019-07-28 NOTE — Progress Notes (Signed)
Message sent to pharmacy to please make medication stat (lantus 25u). Awaiting med delivery this med is not in unit floorstock/ pyxis.

## 2019-07-28 NOTE — Progress Notes (Signed)
Initial Nutrition Assessment  DOCUMENTATION CODES:   Not applicable  INTERVENTION:  Provide Ensure Max Protein po BID, each supplement provides 150 kcal and 30 grams of protein.  Provided diet education regarding DM (note to follow).  NUTRITION DIAGNOSIS:   Inadequate oral intake related to decreased appetite, nausea, vomiting as evidenced by per patient/family report.  GOAL:   Patient will meet greater than or equal to 90% of their needs  MONITOR:   PO intake, Supplement acceptance, Labs, Weight trends, I & O's  REASON FOR ASSESSMENT:   Malnutrition Screening Tool    ASSESSMENT:   69 year old male with PMHx of HTN, DM, CKD stage III admitted with generalized weakness, vomiting, hyperglycemia, AKI.   Met with patient at bedside. He reports that he had nothing to eat for the two days PTA in setting of N/V. Today at lunch he was able to have soup and salad and reports he tolerated it well. Patient reports he usually has a good appetite at baseline. He was concerned regarding his recent weight loss. Discussed importance of adequate intake of protein. Patient is amenable to drinking Ensure Max to help meet protein needs. Also discussed importance of good glycemic control. Patient was amenable to discuss nutrition therapy for DM today so RD reviewed handout with patient.  Patient reports his UBW was 175 lbs and that he has been losing weight recently. According to chart patient was 78.9 kg on 05/10/2019. He is now 71.2 kg (156.97 lbs). He has lost 7.7 kg (9.8% body weight) over the past 2 months, which is significant for time frame.  Medications reviewed and include: Novolog 0-20 units TID, Novolog 0-5 units QHS, Lantus 25 units daily, NS at 100 mL/hr.  Labs reviewed: CBG 155-421, CO2 20, BUN 43, Creatinine 2.62. HgbA1c 12 on 4/29.  Patient is at risk for malnutrition.  NUTRITION - FOCUSED PHYSICAL EXAM:    Most Recent Value  Orbital Region  No depletion  Upper Arm Region   Mild depletion  Thoracic and Lumbar Region  No depletion  Buccal Region  No depletion  Temple Region  No depletion  Clavicle Bone Region  Mild depletion  Clavicle and Acromion Bone Region  No depletion  Scapular Bone Region  No depletion  Dorsal Hand  No depletion  Patellar Region  No depletion  Anterior Thigh Region  No depletion  Posterior Calf Region  No depletion  Edema (RD Assessment)  None  Hair  Reviewed  Eyes  Reviewed  Mouth  Reviewed  Skin  Reviewed  Nails  Reviewed     Diet Order:   Diet Order            Diet heart healthy/carb modified Room service appropriate? Yes; Fluid consistency: Thin  Diet effective now             EDUCATION NEEDS:   Education needs have been addressed  Skin:  Skin Assessment: Reviewed RN Assessment  Last BM:  Unknown  Height:   Ht Readings from Last 1 Encounters:  07/28/19 5' 10"  (1.778 m)   Weight:   Wt Readings from Last 1 Encounters:  07/27/19 71.2 kg   BMI:  Body mass index is 22.52 kg/m.  Estimated Nutritional Needs:   Kcal:  1800-2000  Protein:  90-100 grams  Fluid:  1.8-2 L/day  Jacklynn Barnacle, MS, RD, LDN Pager number available on Amion

## 2019-07-28 NOTE — H&P (Signed)
History and Physical    Nicholas Weiss:[id number] DOB: 02-02-1951 DOA: 07/27/2019  PCP: Birdie Sons, MD   Patient coming from: Home I have personally briefly reviewed patient's old medical records in [place]  Chief Complaint: Weakness  HPI: Nicholas Weiss is a 69 y.o. male with medical history significant for diabetes with several hospitalizations for DKA, hypertension, CKD 3 who presents to the emergency room with generalized weakness. Patient had been vomiting the past couple days and had not taken his insulin as a result. He denied cough, shortness of breath, fever or chills.  Denied abdominal pain vomiting or change in bowel habits.  Denies chest pain.  ED Course: On initial arrival in the emergency room blood sugar was in the 400s with an anion gap of 15.  Blood work was significant for creatinine of 3.58 above baseline of 1.62 for his CKD.  WBC was normal at 5.5.  Beta hydroxybutyric acid was 2.56.  Chest x-ray showed no acute findings.  Troponin slightly elevated at 40.  Patient was treated with IV fluid boluses in the emergency room.  At the time of hospitalist consult, blood sugar was 330 with an anion gap of 8.  Lactic acidosis initially elevated above to improve with IV hydration to 1.2.  Flu and Covid test negative Review of Systems: As per HPI otherwise 10 point review of systems negative.    Past Medical History:  Diagnosis Date  . DKA (diabetic ketoacidoses) ([place]) 04/06/2016  . Hypercholesteremia   . Hypertension     Past Surgical History:  Procedure Laterality Date  . KNEE SURGERY Right    Torn meniscus  . KNEE SURGERY Left      reports that he has never smoked. He has never used smokeless tobacco. He reports previous alcohol use of about 1.0 standard drinks of alcohol per week. He reports current drug use. Drug: Marijuana.  Allergies  Allergen Reactions  . Penicillins Other (See Comments)    Syncope  Has patient had a PCN reaction causing  immediate rash, facial/tongue/throat swelling, SOB or lightheadedness with hypotension: No Has patient had a PCN reaction causing severe rash involving mucus membranes or skin necrosis: No Has patient had a PCN reaction that required hospitalization No Has patient had a PCN reaction occurring within the last 10 years: No If all of the above answers are "NO", then may proceed with Cephalosporin use.     Family History  Problem Relation Age of Onset  . Heart attack Father   . Hypertension Sister   . Cancer Sister      Prior to Admission medications   Medication Sig Start Date End Date Taking? Authorizing Provider  amLODipine (NORVASC) 10 MG tablet Take 1 tablet (10 mg total) by mouth daily. 04/07/19  Yes Dhungel, Nishant, MD  aspirin 81 MG chewable tablet Chew 1 tablet by mouth daily.   Yes [provider]  gabapentin (NEURONTIN) 300 MG capsule Take 1 capsule (300 mg total) by mouth 2 (two) times daily. 05/19/19  Yes Birdie Sons, MD  insulin aspart (NOVOLOG) 100 UNIT/ML injection Inject 0-10 Units into the skin 4 (four) times daily -  before meals and at bedtime. If your sugar is under 100 before the meal and you eat a meal, take 2 units novolog after the meal If your sugar is 100-150, take 4 units before the meal If your sugar is 151-200, take 6 units before the meal If your sugar if 201-250 take 8 units  before the meal If your sugar is 251-300 take 10 units before the meal If your sugar is over 300, take 12 units before the meal.  If it is bedtime or any other time when you are not eating a meal and sugar is over 300, take 4 units If under 300 at bedtime, don't take any    Yes [provider]  insulin degludec (TRESIBA FLEXTOUCH) 100 UNIT/ML SOPN FlexTouch Pen 20 units once a day, increase as directed to keep fasting glucose <120 Patient taking differently: Inject 30 Units into the skin daily.  04/28/19  Yes Sreenath, Sudheer B, MD  lovastatin (MEVACOR) 40 MG tablet  Take 1 tablet (40 mg total) by mouth at bedtime. 08/02/18  Yes Birdie Sons, MD    Physical Exam: Vitals:   07/27/19 2220 07/27/19 2300 07/28/19 0000 07/28/19 0100  BP:  119/70 133/74 (!) 152/73  Pulse:  (!) 102 94 90  Resp:  13 12 13   Temp:      TempSrc:      SpO2:  99% 98% 98%  Weight: 71.2 kg        Vitals:   07/27/19 2220 07/27/19 2300 07/28/19 0000 07/28/19 0100  BP:  119/70 133/74 (!) 152/73  Pulse:  (!) 102 94 90  Resp:  13 12 13   Temp:      TempSrc:      SpO2:  99% 98% 98%  Weight: 71.2 kg       Constitutional: Drowsy but easily arousable, oriented x3, not in any acute distress. Eyes: PERLA, EOMI, irises appear normal, anicteric sclera,  ENMT: external ears and nose appear normal, normal hearing             Lips appears normal, oropharynx mucosa, tongue, posterior pharynx appear normal  Neck: neck appears normal, no masses, normal ROM, no thyromegaly, no JVD  CVS: S1-S2 clear, no murmur rubs or gallops,  , no carotid bruits, pedal pulses palpable, No LE edema Respiratory:  clear to auscultation bilaterally, no wheezing, rales or rhonchi. Respiratory effort normal. No accessory muscle use.  Abdomen: soft nontender, nondistended, normal bowel sounds, no hepatosplenomegaly, no hernias Musculoskeletal: : no cyanosis, clubbing , no contractures or atrophy Neuro: Cranial nerves II-XII intact, sensation, reflexes normal, strength Psych: judgement and insight appear normal, stable mood and affect,  Skin: no rashes or lesions or ulcers, no induration or nodules   Labs on Admission: I have personally reviewed following labs and imaging studies  CBC: Recent Labs  Lab 07/27/19 2232  WBC 5.5  HGB 11.1*  HCT 32.4*  MCV 89.5  PLT 144   Basic Metabolic Panel: Recent Labs  Lab 07/27/19 2232 07/28/19 0211  NA 138 140  K 4.8 4.5  CL 102 110  CO2 21* 22  GLUCOSE 449* 330*  BUN 40* 39*  CREATININE 3.05* 2.58*  CALCIUM 9.3 8.3*  MG 2.5*  --    GFR: Estimated  Creatinine Clearance: 28.4 mL/min (A) (by C-G formula based on SCr of 2.58 mg/dL (H)). Liver Function Tests: Recent Labs  Lab 07/27/19 2232  AST 18  ALT 15  ALKPHOS 61  BILITOT 1.0  PROT 8.7*  ALBUMIN 3.4*   No results for input(s): LIPASE, AMYLASE in the last 168 hours. No results for input(s): AMMONIA in the last 168 hours. Coagulation Profile: No results for input(s): INR, PROTIME in the last 168 hours. Cardiac Enzymes: No results for input(s): CKTOTAL, CKMB, CKMBINDEX, TROPONINI in the last 168 hours. BNP (last 3 results)  No results for input(s): PROBNP in the last 8760 hours. HbA1C: No results for input(s): HGBA1C in the last 72 hours. CBG: Recent Labs  Lab 07/27/19 2216 07/28/19 0211  GLUCAP 449* 320*   Lipid Profile: No results for input(s): CHOL, HDL, LDLCALC, TRIG, CHOLHDL, LDLDIRECT in the last 72 hours. Thyroid Function Tests: No results for input(s): TSH, T4TOTAL, FREET4, T3FREE, THYROIDAB in the last 72 hours. Anemia Panel: No results for input(s): VITAMINB12, FOLATE, FERRITIN, TIBC, IRON, RETICCTPCT in the last 72 hours. Urine analysis:    Component Value Date/Time   COLORURINE COLORLESS (A) 04/27/2019 0533   APPEARANCEUR CLEAR (A) 04/27/2019 0533   LABSPEC 1.003 (L) 04/27/2019 0533   PHURINE 6.0 04/27/2019 0533   GLUCOSEU >=500 (A) 04/27/2019 0533   HGBUR NEGATIVE 04/27/2019 0533   BILIRUBINUR NEGATIVE 04/27/2019 0533   KETONESUR NEGATIVE 04/27/2019 0533   PROTEINUR NEGATIVE 04/27/2019 0533   NITRITE NEGATIVE 04/27/2019 0533   LEUKOCYTESUR NEGATIVE 04/27/2019 0533    Radiological Exams on Admission: DG Chest Portable 1 View  Result Date: 07/27/2019 CLINICAL DATA:  Shortness of breath and weakness EXAM: PORTABLE CHEST 1 VIEW COMPARISON:  04/06/2019 FINDINGS: Cardiac shadow is within normal limits. Aortic calcifications are noted. The lungs are well aerated bilaterally. No focal infiltrate or sizable effusion is seen. No bony abnormality is noted.  IMPRESSION: No acute abnormality noted. Aortic Atherosclerosis (ICD10-I70.0). Electronically Signed   By: Inez Catalina M.D.   On: 07/27/2019 22:51    EKG: Independently reviewed.   Assessment/Plan Principal Problem:   Generalized weakness  Vomiting -Uncertain etiology. -No obvious source of infection. Covid negative -Continue IV hydration and continue to monitor -Follow-up lipase    Hyperglycemia due to type 2 diabetes mellitus (HCC) -Insulin sliding scale coverage.  Blood sugar now 330 with anion gap of 8    Essential (primary) hypertension -Continue home meds    AKI (acute kidney injury) ([place])   CKD (chronic kidney disease) stage 3, GFR 30-59 ml/min -Prerenal secondary to vomiting -IV hydration.  Monitor renal function and avoid nephrotoxins    DVT prophylaxis: Lovenox  Code Status: full code  Family Communication:  none  Disposition Plan: Back to previous home environment Consults called: none  Status:obs    Athena Masse MD [facility]     07/28/2019, 3:06 AM

## 2019-07-28 NOTE — Progress Notes (Addendum)
He feels better.  Earlier this morning, patient was lethargic according to his nurse, Elray Mcgregor.  However, his mental status had improved by the time of my visit.  He has no complaints.  He said he was diagnosed with diabetes mellitus at age 69 and was initially started on pills.  However, he was transitioned to insulin therapy because of poor glucose control.  He said his blood glucose levels have been running high at home (in the 300s and 400s).  He said he takes [name] 30 units daily but he had not taken it for the past 4 days or so.  He was only taking his fast acting insulin, usually about 12 units with each meal.  His thinking was that the fast acting insulin will bring down the glucose quickly so that he could go back to taking his [name].  However, his glucose levels were not improving with a fast acting insulin.  Vital signs are stable and physical exam is unremarkable.  Repeat BMP does not show any evidence of DKA but he is still hyperglycemic.  Glucose level was as high as 505 this morning.  He has been started on 25 units of Lantus daily.  Based on this information, patient will likely need more Lantus.  Continue NovoLog.  Continue IV fluids for hydration.  Patient has been educated on the importance of taking his [name], long-acting insulin, in addition to fast acting insulin every day.  He verbalized understanding.  Consult diabetic educator for more education on diabetes management.

## 2019-07-28 NOTE — Progress Notes (Signed)
Following message sent to MD:  patient lethargic and breath smells sweet last check done now remains 395 ml/dl. Can we please check anion gap, urine ketones, chem and VBG?

## 2019-07-29 ENCOUNTER — Inpatient Hospital Stay: Payer: Medicare HMO

## 2019-07-29 DIAGNOSIS — N1831 Chronic kidney disease, stage 3a: Secondary | ICD-10-CM

## 2019-07-29 LAB — BASIC METABOLIC PANEL
Anion gap: 5 (ref 5–15)
BUN: 27 mg/dL — ABNORMAL HIGH (ref 8–23)
CO2: 23 mmol/L (ref 22–32)
Calcium: 8.1 mg/dL — ABNORMAL LOW (ref 8.9–10.3)
Chloride: 108 mmol/L (ref 98–111)
Creatinine, Ser: 1.52 mg/dL — ABNORMAL HIGH (ref 0.61–1.24)
GFR calc Af Amer: 55 mL/min — ABNORMAL LOW (ref >60)
GFR calc non Af Amer: 47 mL/min — ABNORMAL LOW (ref >60)
Glucose, Bld: 173 mg/dL — ABNORMAL HIGH (ref 70–99)
Potassium: 4.2 mmol/L (ref 3.5–5.1)
Sodium: 136 mmol/L (ref 135–145)

## 2019-07-29 LAB — GLUCOSE, CAPILLARY
Glucose-Capillary: 108 mg/dL — ABNORMAL HIGH (ref 70–99)
Glucose-Capillary: 202 mg/dL — ABNORMAL HIGH (ref 70–99)

## 2019-07-29 IMAGING — CT CT HEAD W/O CM
4 series · 16 of 47 positions shown, 18 images · non-contrast
Comparison: [DATE]

CLINICAL DATA: Headache, recent fall

EXAM:
CT HEAD WITHOUT CONTRAST
TECHNIQUE: Contiguous axial images were obtained from the base of the skull
through the vertex without intravenous contrast.

[Series 2: head wo · axial · 0.41mm/px · z∈[-101,+4]mm · 7 of 29 slices shown, 9 images]
[im 4/29  brain]
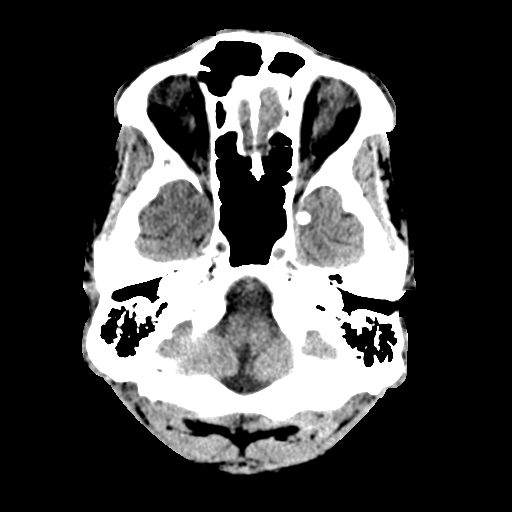
[im 4/29  bone]
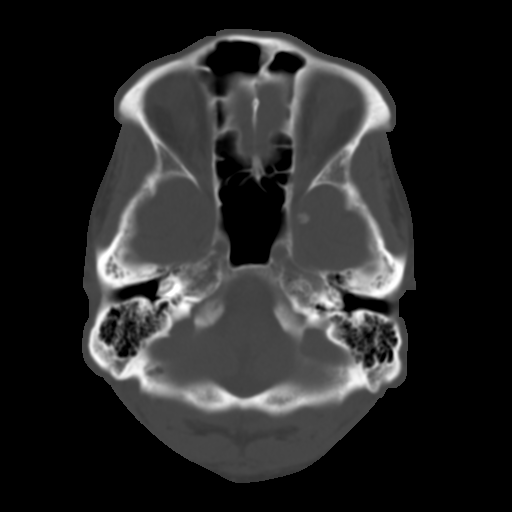
[im 8/29  brain]
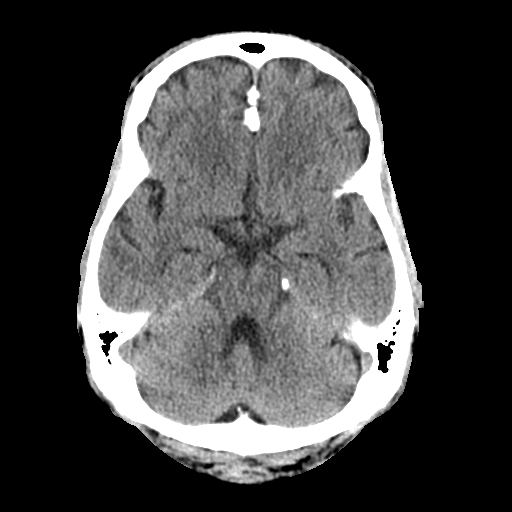
[im 11/29  brain]
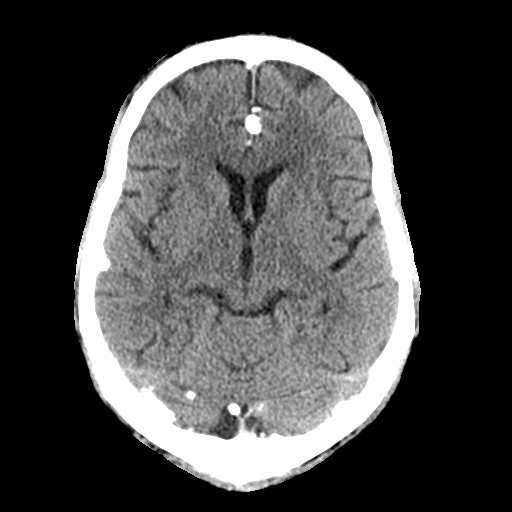
[im 15/29  brain]
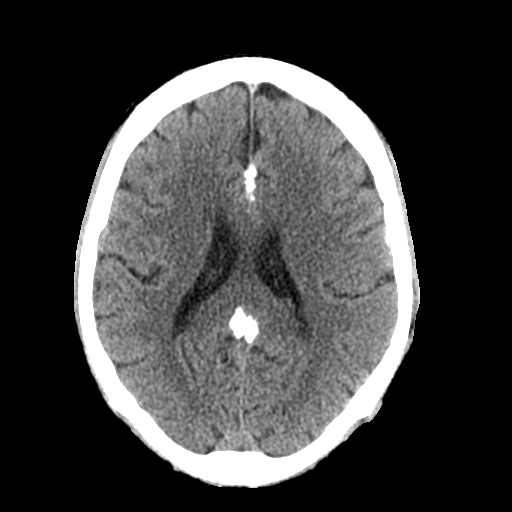
[im 18/29  brain]
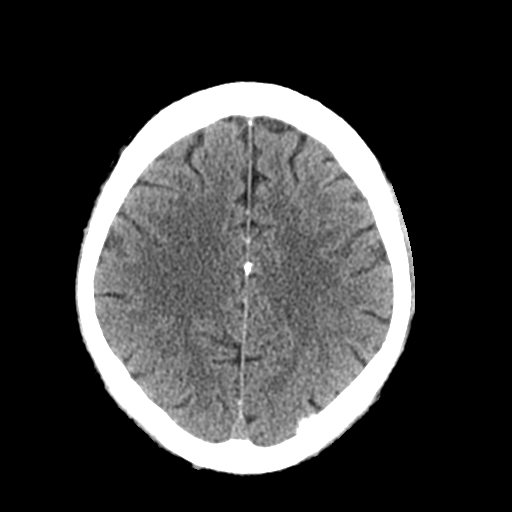
[im 18/29  bone]
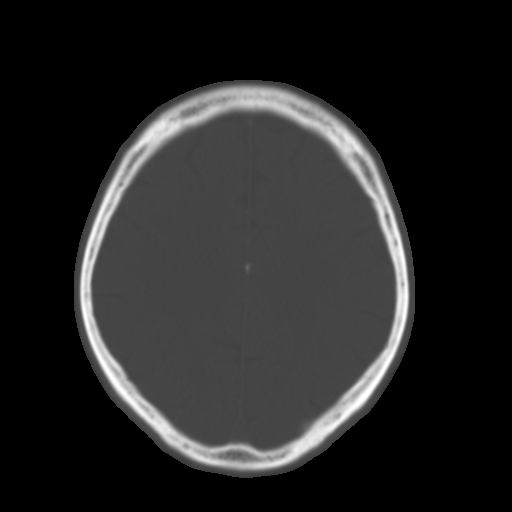
[im 22/29  brain]
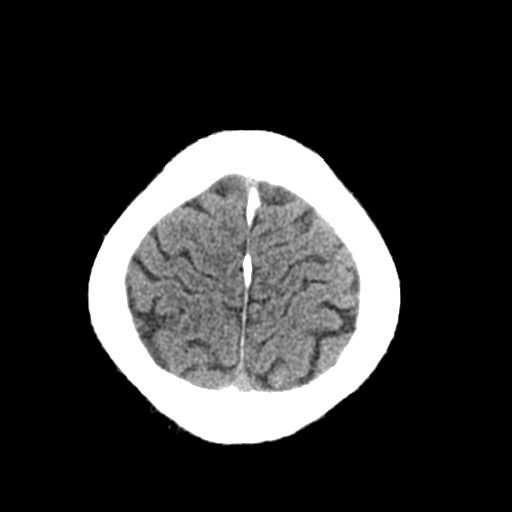
[im 25/29  brain]
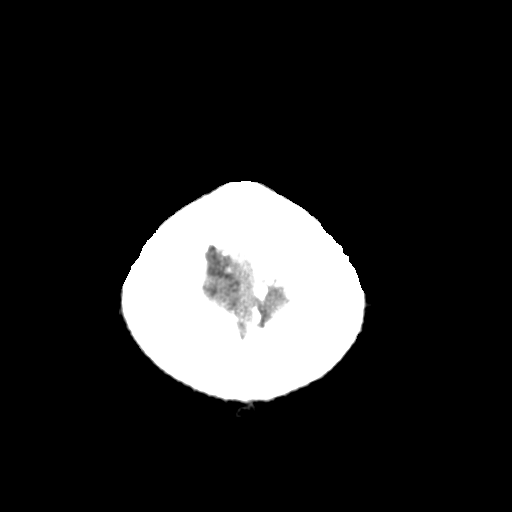

[Series 3: head bone · axial · 0.41mm/px · z∈[-102,-74]mm · 3 of 72 slices shown]
[im 8/72  bone]
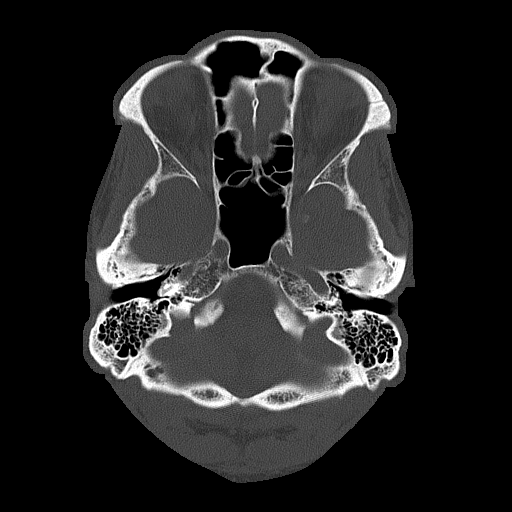
[im 15/72  bone]
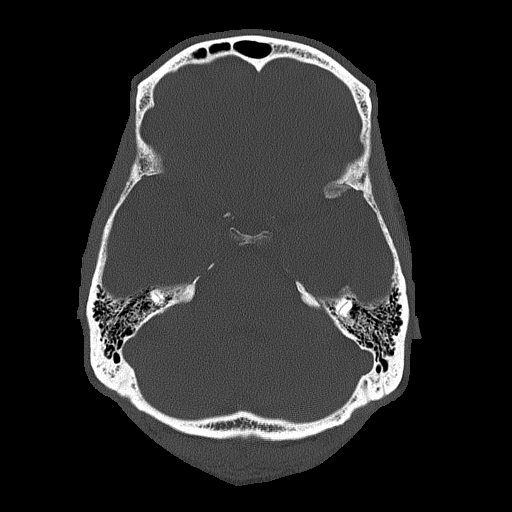
[im 22/72  bone]
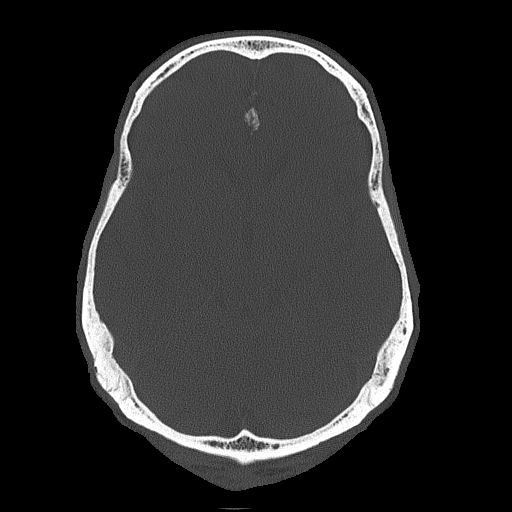

[Series 4: coronal soft tissue · coronal · 0.29mm/px · 3 of 68 slices shown]
[im 23/68  brain]
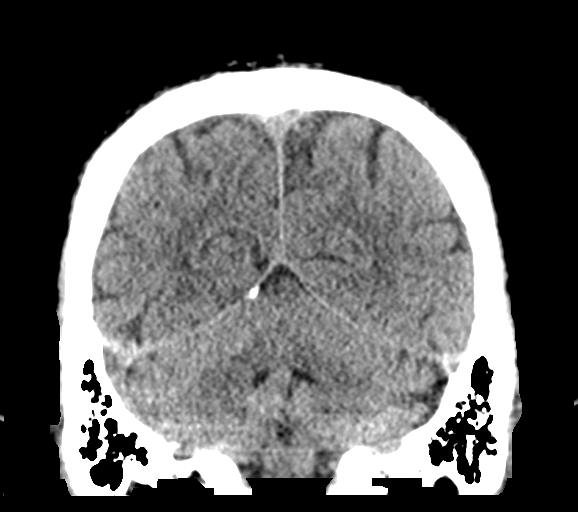
[im 30/68  brain]
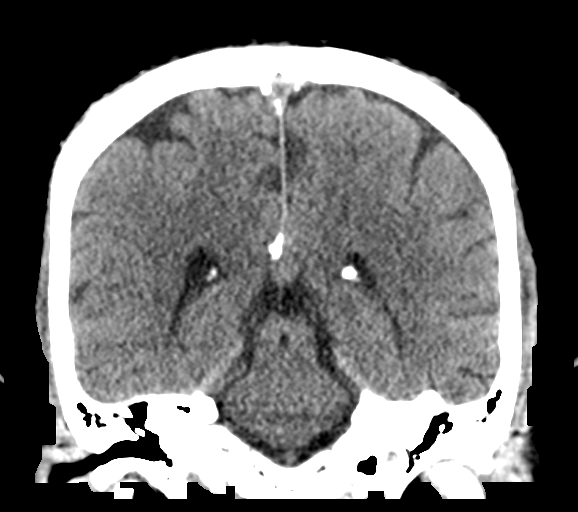
[im 38/68  brain]
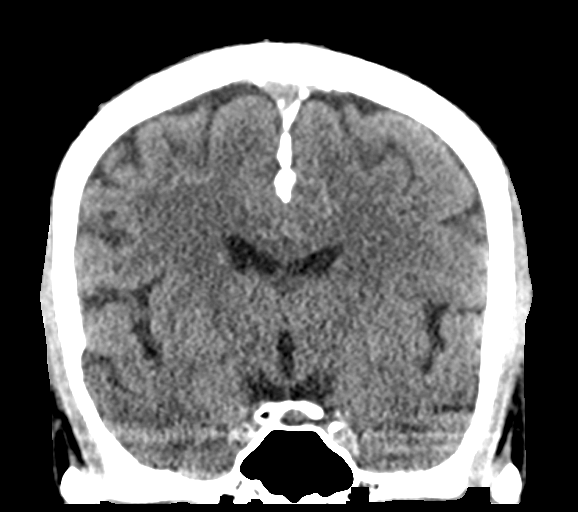

[Series 5: sagittal soft tissue · sagittal · 0.29mm/px · 3 of 56 slices shown]
[im 19/56  brain]
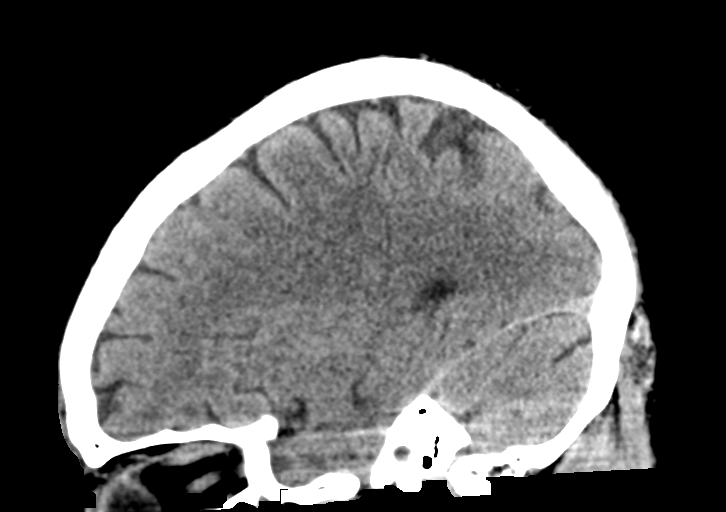
[im 28/56  brain]
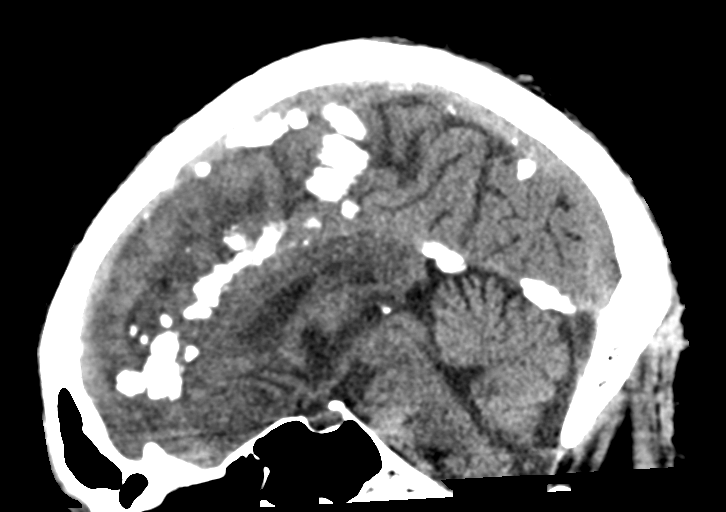
[im 37/56  brain]
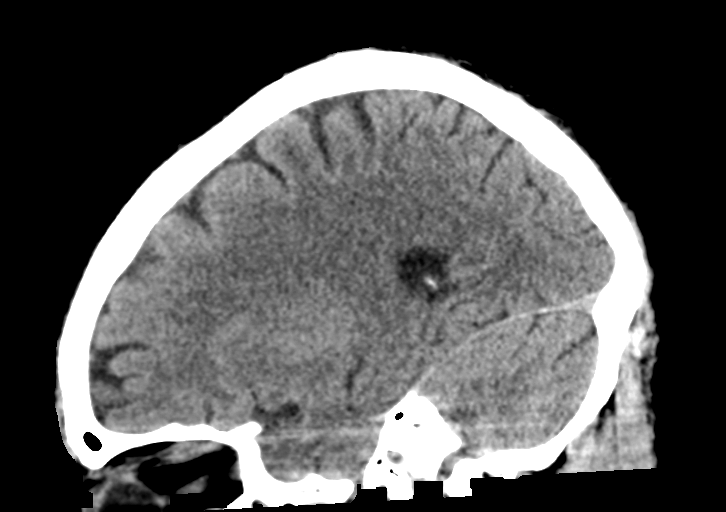

[16 of 47 positions shown; findings below may reference images not displayed]

FINDINGS: Brain: There is no acute intracranial hemorrhage, mass effect, or
edema. Gray-white differentiation is preserved. There is no
extra-axial fluid collection. Ventricles and sulci are within normal
limits in size and configuration. Prominent dural calcifications
again noted.

Vascular: There is atherosclerotic calcification at the skull base.

Skull: Calvarium is unremarkable.

Sinuses/Orbits: Mild patchy paranasal sinus mucosal thickening.
Visualized orbits are unremarkable.

Other: Mastoid air cells are clear.
IMPRESSION: No acute abnormality.

## 2019-07-29 MED ORDER — INSULIN ASPART 100 UNIT/ML ~~LOC~~ SOLN
0.0000 [IU] | Freq: Three times a day (TID) | SUBCUTANEOUS | Status: DC
  Administered 2019-07-29: 5 [IU] via SUBCUTANEOUS
  Administered 2019-07-29: 2 [IU] via SUBCUTANEOUS
  Filled 2019-07-29 (×2): qty 1

## 2019-07-29 MED ORDER — PRAVASTATIN SODIUM 20 MG PO TABS
40.0000 mg | ORAL_TABLET | Freq: Every day | ORAL | Status: DC
  Administered 2019-07-29: 16:00:00 40 mg via ORAL
  Filled 2019-07-29: qty 2

## 2019-07-29 MED ORDER — INSULIN ASPART 100 UNIT/ML ~~LOC~~ SOLN: 0.0000 [IU] | Freq: Every day | SUBCUTANEOUS | Status: DC

## 2019-07-29 MED ORDER — GABAPENTIN 300 MG PO CAPS
300.0000 mg | ORAL_CAPSULE | Freq: Two times a day (BID) | ORAL | Status: DC
  Administered 2019-07-29: 300 mg via ORAL
  Filled 2019-07-29: qty 1

## 2019-07-29 MED ORDER — ENOXAPARIN SODIUM 40 MG/0.4ML ~~LOC~~ SOLN: 40.0000 mg | SUBCUTANEOUS | Status: DC

## 2019-07-29 MED ORDER — ASPIRIN 81 MG PO CHEW
81.0000 mg | CHEWABLE_TABLET | Freq: Every day | ORAL | Status: DC
  Administered 2019-07-29: 81 mg via ORAL
  Filled 2019-07-29: qty 1

## 2019-07-29 MED ORDER — TRESIBA FLEXTOUCH 100 UNIT/ML ~~LOC~~ SOPN: 30.0000 [IU] | PEN_INJECTOR | Freq: Every day | SUBCUTANEOUS | Status: DC

## 2019-07-29 NOTE — Progress Notes (Signed)
Following message sent to MD Dr Mal Misty.   Wife asking to speak with you phone number is [phone].  Would like to discuss concerns. She is wondering if there may be some kind of concussion or TBI after episode of head trauma that required oral surgery that occurred appx one month ago. Pt did not seek hospital care but has had increased nausea since and wife says eyes have changed. Can we do imaging to check?  Suggested starting reglan for gastric motility. Awaiting response from MD

## 2019-07-29 NOTE — Progress Notes (Signed)
Discharge instructions reviewed using teach back method. Questions encouraged and answered. Pt leaving in stable condition via wheelchair.

## 2019-07-29 NOTE — Discharge Summary (Addendum)
Physician Discharge Summary  Nicholas Weiss WNI:[id number] DOB: 12-Jan-1951 DOA: 07/27/2019  PCP: Birdie Sons, MD  Admit date: 07/27/2019 Discharge date: 07/29/2019  Discharge disposition: Home    Recommendations for Outpatient Follow-Up:   Outpatient follow-up with PCP in 1 week  Discharge Diagnosis:   Principal Problem:   Generalized weakness Active Problems:   Essential (primary) hypertension   AKI (acute kidney injury) ([place])   CKD (chronic kidney disease) stage 3, GFR 30-59 ml/min   Hyperglycemia due to type 2 diabetes mellitus ([place])   Type 2 diabetes mellitus with hyperglycemia ([place])    Discharge Condition: Stable.  Diet recommendation: Low-salt diet, diabetic diet  Code status: Full code.    Hospital Course:   Nicholas Weiss is a 69 year old man with medical history significant for hypertension, type 2 diabetes mellitus with hospitalizations for DKA in the past, CKD stage IIIa, polysubstance abuse (cocaine, cannabis).  He presented to the hospital because of generalized weakness, nausea and vomiting.  Work-up revealed severe hyperglycemia (but he was not in DKA) and acute kidney injury.  He was treated with IV fluids and insulin (Lantus and NovoLog).  He takes [name] 30 units daily at home and NovoLog about 12 units with each meal.  However, he said that he had stopped taking his [name] for about 4 to 5 days (prior to admission) because his blood sugar was high.  He thought that he just needed NovoLog to bring his sugar down before he could resume [name].  He was therefore educated on the management of diabetes and its complications in general and the appropriate use of long-acting and fast acting insulin.  The patient stated he does not have any recurrent history of vomiting but he only had nausea and vomiting a few days prior to admission when his sugar was very high and he thinks that is what made him sick.  He insisted that nausea and vomiting happened for  a few days and it's not an ongoing problem.  His significant other Ivin Booty) was concerned that patient had fallen about a month prior to admission and at that time he hit his head on the floor.  She said patient had to undergo some type of oral surgery.  The wife was concerned that patient probably had something going on in his brain because he has been acting different. She said he had problems with his vision on 2 occasions. She also said she suspects that patient uses drugs like cocaine.  CT scan of the head was done but there was no evidence of acute intracranial abnormality. Patient said he takes Aspirin sparingly at home. I recommended he takes low dose Aspirin on a regular basis because of his risk for stroke. He's aware of risks associated with long term Aspirin use.   His condition has improved.  He was able to tolerate breakfast and lunch without any nausea or vomiting.  Creatinine and glucose levels have improved.  He is deemed stable for discharge to home. Case was discussed with Ivin Booty over the phone per patient's request.    Discharge Exam:   Vitals:   07/28/19 2230 07/28/19 2343  BP:  119/75  Pulse:  76  Resp:  16  Temp: 99.9 F (37.7 C) 98.3 F (36.8 C)  SpO2: 100% 98%   Vitals:   07/28/19 0827 07/28/19 1632 07/28/19 2230 07/28/19 2343  BP: (!) 167/81 (!) 152/79  119/75  Pulse: 97 86  76  Resp: 16 18  16   Temp:  98.1 F (36.7 C) 97.9 F (36.6 C) 99.9 F (37.7 C) 98.3 F (36.8 C)  TempSrc: Oral Oral Oral Oral  SpO2: 100% 100% 100% 98%  Weight:      Height:         GEN: NAD SKIN: No rash EYES: EOMI ENT: MMM CV: RRR PULM: CTA B ABD: soft, ND, NT, +BS CNS: AAO x 3, non focal EXT: No edema or tenderness   The results of significant diagnostics from this hospitalization (including imaging, microbiology, ancillary and laboratory) are listed below for reference.     Procedures and Diagnostic Studies:   DG Chest Portable 1 View  Result Date:  07/27/2019 CLINICAL DATA:  Shortness of breath and weakness EXAM: PORTABLE CHEST 1 VIEW COMPARISON:  04/06/2019 FINDINGS: Cardiac shadow is within normal limits. Aortic calcifications are noted. The lungs are well aerated bilaterally. No focal infiltrate or sizable effusion is seen. No bony abnormality is noted. IMPRESSION: No acute abnormality noted. Aortic Atherosclerosis (ICD10-I70.0). Electronically Signed   By: Inez Catalina M.D.   On: 07/27/2019 22:51     Labs:   Basic Metabolic Panel: Recent Labs  Lab 07/27/19 2232 07/27/19 2232 07/28/19 0211 07/28/19 0211 07/28/19 1113 07/29/19 0830  NA 138  --  140  --  138 136  K 4.8   < > 4.5   < > 4.2 4.2  CL 102  --  110  --  108 108  CO2 21*  --  22  --  20* 23  GLUCOSE 449*  --  330*  --  386* 173*  BUN 40*  --  39*  --  43* 27*  CREATININE 3.05*  --  2.58*  --  2.62* 1.52*  CALCIUM 9.3  --  8.3*  --  8.5* 8.1*  MG 2.5*  --   --   --   --   --    < > = values in this interval not displayed.   GFR Estimated Creatinine Clearance: 48.1 mL/min (A) (by C-G formula based on SCr of 1.52 mg/dL (H)). Liver Function Tests: Recent Labs  Lab 07/27/19 2232  AST 18  ALT 15  ALKPHOS 61  BILITOT 1.0  PROT 8.7*  ALBUMIN 3.4*   Recent Labs  Lab 07/28/19 0210  LIPASE 16   No results for input(s): AMMONIA in the last 168 hours. Coagulation profile No results for input(s): INR, PROTIME in the last 168 hours.  CBC: Recent Labs  Lab 07/27/19 2232  WBC 5.5  HGB 11.1*  HCT 32.4*  MCV 89.5  PLT 288   Cardiac Enzymes: No results for input(s): CKTOTAL, CKMB, CKMBINDEX, TROPONINI in the last 168 hours. BNP: Invalid input(s): POCBNP CBG: Recent Labs  Lab 07/28/19 1151 07/28/19 1602 07/28/19 2134 07/29/19 0501 07/29/19 1141  GLUCAP 329* 155* 126* 108* 202*   D-Dimer No results for input(s): DDIMER in the last 72 hours. Hgb A1c Recent Labs    07/27/19 2232  HGBA1C 12.0*   Lipid Profile No results for input(s): CHOL, HDL,  LDLCALC, TRIG, CHOLHDL, LDLDIRECT in the last 72 hours. Thyroid function studies No results for input(s): TSH, T4TOTAL, T3FREE, THYROIDAB in the last 72 hours.  Invalid input(s): FREET3 Anemia work up No results for input(s): VITAMINB12, FOLATE, FERRITIN, TIBC, IRON, RETICCTPCT in the last 72 hours. Microbiology Recent Results (from the past 240 hour(s))  Blood culture (routine x 2)     Status: None (Preliminary result)   Collection Time: 07/27/19 10:32 PM   Specimen: Right  Antecubital; Blood  Result Value Ref Range Status   Specimen Description RIGHT ANTECUBITAL  Final   Special Requests   Final    BOTTLES DRAWN AEROBIC AND ANAEROBIC Blood Culture results may not be optimal due to an excessive volume of blood received in culture bottles   Culture   Final    NO GROWTH 2 DAYS Performed at [facility], [address], [place], [place] [address]    Report Status PENDING  Incomplete  Blood culture (routine x 2)     Status: None (Preliminary result)   Collection Time: 07/27/19 10:32 PM   Specimen: Left Antecubital; Blood  Result Value Ref Range Status   Specimen Description LEFT ANTECUBITAL  Final   Special Requests   Final    BOTTLES DRAWN AEROBIC AND ANAEROBIC Blood Culture results may not be optimal due to an excessive volume of blood received in culture bottles   Culture   Final    NO GROWTH 2 DAYS Performed at [facility], [address] [place], [place] [address]    Report Status PENDING  Incomplete  Respiratory Panel by RT PCR (Flu A&B, Covid) - Nasopharyngeal Swab     Status: None   Collection Time: 07/27/19 10:32 PM   Specimen: Nasopharyngeal Swab  Result Value Ref Range Status   SARS Coronavirus 2 by RT PCR NEGATIVE NEGATIVE Final    Comment: (NOTE) SARS-CoV-2 target nucleic acids are NOT DETECTED. The SARS-CoV-2 RNA is generally detectable in upper respiratoy specimens during the acute phase of infection. The lowest concentration of SARS-CoV-2  viral copies this assay can detect is 131 copies/mL. A negative result does not preclude SARS-Cov-2 infection and should not be used as the sole basis for treatment or other patient management decisions. A negative result may occur with  improper specimen collection/handling, submission of specimen other than nasopharyngeal swab, presence of viral mutation(s) within the areas targeted by this assay, and inadequate number of viral copies (<131 copies/mL). A negative result must be combined with clinical observations, patient history, and epidemiological information. The expected result is Negative. Fact Sheet for Patients:  PinkCheek.be Fact Sheet for Healthcare Providers:  GravelBags.it This test is not yet ap proved or cleared by the [place] FDA and  has been authorized for detection and/or diagnosis of SARS-CoV-2 by FDA under an Emergency Use Authorization (EUA). This EUA will remain  in effect (meaning this test can be used) for the duration of the COVID-19 declaration under Section 564(b)(1) of the Act, 21 U.S.C. section 360bbb-3(b)(1), unless the authorization is terminated or revoked sooner.    Influenza A by PCR NEGATIVE NEGATIVE Final   Influenza B by PCR NEGATIVE NEGATIVE Final    Comment: (NOTE) The Xpert Xpress SARS-CoV-2/FLU/RSV assay is intended as an aid in  the diagnosis of influenza from Nasopharyngeal swab specimens and  should not be used as a sole basis for treatment. Nasal washings and  aspirates are unacceptable for Xpert Xpress SARS-CoV-2/FLU/RSV  testing. Fact Sheet for Patients: PinkCheek.be Fact Sheet for Healthcare Providers: GravelBags.it This test is not yet approved or cleared by the [place] FDA and  has been authorized for detection and/or diagnosis of SARS-CoV-2 by  FDA under an Emergency Use Authorization (EUA). This EUA will  remain  in effect (meaning this test can be used) for the duration of the  Covid-19 declaration under Section 564(b)(1) of the Act, 21  U.S.C. section 360bbb-3(b)(1), unless the authorization is  terminated or revoked. Performed at [facility], [place], [place] [address]      Discharge Instructions:   Discharge Instructions    Diet - low sodium heart healthy   Complete by: As directed    Diet Carb Modified   Complete by: As directed    Increase activity slowly   Complete by: As directed      Allergies as of 07/29/2019      Reactions   Penicillins Other (See Comments)   Syncope  Has patient had a PCN reaction causing immediate rash, facial/tongue/throat swelling, SOB or lightheadedness with hypotension: No Has patient had a PCN reaction causing severe rash involving mucus membranes or skin necrosis: No Has patient had a PCN reaction that required hospitalization No Has patient had a PCN reaction occurring within the last 10 years: No If all of the above answers are "NO", then may proceed with Cephalosporin use.      Medication List    TAKE these medications   amLODipine 10 MG tablet Commonly known as: NORVASC Take 1 tablet (10 mg total) by mouth daily.   aspirin 81 MG chewable tablet Chew 1 tablet by mouth daily.   gabapentin 300 MG capsule Commonly known as: NEURONTIN Take 1 capsule (300 mg total) by mouth 2 (two) times daily.   insulin aspart 100 UNIT/ML injection Commonly known as: novoLOG Inject 0-10 Units into the skin 4 (four) times daily -  before meals and at bedtime. If your sugar is under 100 before the meal and you eat a meal, take 2 units novolog after the meal If your sugar is 100-150, take 4 units before the meal If your sugar is 151-200, take 6 units before the meal If your sugar if 201-250 take 8 units before the meal If your sugar is 251-300 take 10 units before the meal If your sugar is over 300, take 12 units before the  meal.  If it is bedtime or any other time when you are not eating a meal and sugar is over 300, take 4 units If under 300 at bedtime, don't take any   lovastatin 40 MG tablet Commonly known as: MEVACOR Take 1 tablet (40 mg total) by mouth at bedtime.   [name] FlexTouch 100 UNIT/ML FlexTouch Pen Generic drug: insulin degludec Inject 0.3 mLs (30 Units total) into the skin daily.         Time coordinating discharge: 34 minutes  Signed:  Jerre Diguglielmo  [facility] 07/29/2019, 4:32 PM

## 2019-07-29 NOTE — Progress Notes (Signed)
PHARMACIST - PHYSICIAN COMMUNICATION  CONCERNING:  Enoxaparin (Lovenox) for DVT Prophylaxis    RECOMMENDATION: Patient was prescribed enoxaparin 30 mg q24 hours for VTE prophylaxis.   Filed Weights   07/27/19 2220  Weight: 71.2 kg (156 lb 15.5 oz)    Body mass index is 22.52 kg/m.  Estimated Creatinine Clearance: 48.1 mL/min (A) (by C-G formula based on SCr of 1.52 mg/dL (H)).   Based on [place] patient is candidate for enoxaparin 40 mg every 24 hours based on CrCl > 21ml/min  DESCRIPTION: Pharmacy has adjusted enoxaparin dose per [facility] policy.   Patient is now receiving enoxaparin 40 mg every 24 hours.  Tawnya Crook, PharmD Clinical Pharmacist  07/29/2019 12:03 PM

## 2019-07-31 ENCOUNTER — Telehealth: Payer: Self-pay

## 2019-07-31 LAB — GLUCOSE, CAPILLARY
Glucose-Capillary: 149 mg/dL — ABNORMAL HIGH (ref 70–99)
Glucose-Capillary: 75 mg/dL (ref 70–99)

## 2019-07-31 NOTE — Telephone Encounter (Signed)
No HFU scheduled at this time. 

## 2019-07-31 NOTE — Telephone Encounter (Signed)
Transition Care Management Follow-up Telephone Call  Date of discharge and from where: [facility] on 07/29/19.  How have you been since you were released from the hospital? Doing much better. BS is running between 117-200. The 200 reading is only at night before bed. Appetite has returned to normal. Declines pain, fever, weakness or n/v/d.  Any questions or concerns? No   Items Reviewed:  Did the pt receive and understand the discharge instructions provided? Yes   Medications obtained and verified? Yes   Any new allergies since your discharge? No   Dietary orders reviewed? Yes  Do you have support at home? Yes   Other (ie: DME, Home Health, etc): N/A  Functional Questionnaire: (I = Independent and D = Dependent)  Bathing/Dressing- I   Meal Prep- I  Eating- I  Maintaining continence- I  Transferring/Ambulation- I  Managing Meds- I   Follow up appointments reviewed:    PCP Hospital f/u appt confirmed? Pt has has a f/u apt already scheduled for 08/14/19. Pt declined scheduling an additional HFU apt.   [facility] f/u appt confirmed? N/A   Are transportation arrangements needed? No   If their condition worsens, is the pt aware to call  their PCP or go to the ED? Yes  Was the patient provided with contact information for the PCP's office or ED? Yes  Was the pt encouraged to call back with questions or concerns? Yes

## 2019-08-01 LAB — CULTURE, BLOOD (ROUTINE X 2)
Culture: NO GROWTH
Culture: NO GROWTH

## 2019-08-11 NOTE — Progress Notes (Signed)
Established patient visit   Patient: Nicholas Weiss   DOB: 04-28-1950   69 y.o. Male  MRN: [id number] Visit Date: 08/14/2019  Today's healthcare provider: Lelon Huh, MD   Chief Complaint  Patient presents with  . Hospitalization Follow-up   Subjective    HPI Follow up Hospitalization  Patient was admitted to [facility] on 07/28/2019 and discharged on 07/29/2019. He was treated for Hyperglycemia, Uncontrolled DM2. Treatment for this included insulin sliding scale coverage. Telephone follow up was done on 07/31/2019 He reports good compliance with treatment. He reports this condition is improved. Home fasting blood sugars are averaging between 80-140. His evening blood sugars has been as low as 80.   He states he often does not [name] is very high because he thought he was just supposed to take the Novolog sliding scale. He states he has frequently seen fasting sugars under 100 when he does take [name].   ----------------------------------------------------------------------------------------- -  Diabetes Mellitus Type II, Follow-up  Lab Results  Component Value Date   HGBA1C 12.0 (H) 07/27/2019   HGBA1C 12.0 (H) 04/05/2019   HGBA1C 11.7 (A) 04/04/2019   Wt Readings from Last 3 Encounters:  08/14/19 166 lb (75.3 kg)  07/27/19 156 lb 15.5 oz (71.2 kg)  05/10/19 174 lb (78.9 kg)   Last seen for diabetes 3 months ago.      Management since then includes patient advised to follow up insulin regiment as instructed by Dr.O'connol.   He reports good compliance with treatment. He is not having side effects.  Symptoms: No fatigue No foot ulcerations  No appetite changes No nausea  Yes paresthesia of the feet  No polydipsia  No polyuria No visual disturbances   No vomiting     Home blood sugar records: fasting range: 80-140  Episodes of hypoglycemia? No occurs mostly at night   Current insulin regiment: [name] and Novolog sliding scale.  Most Recent Eye Exam: 1  month ago Current exercise: none Current diet habits: in general, an "unhealthy" diet  Pertinent Labs: Lab Results  Component Value Date   CHOL 138 12/19/2018   HDL 46 12/19/2018   LDLCALC 78 12/19/2018   TRIG 70 12/19/2018   CHOLHDL 3.0 12/19/2018   Lab Results  Component Value Date   NA 136 07/29/2019   K 4.2 07/29/2019   CREATININE 1.52 (H) 07/29/2019   GFRNONAA 47 (L) 07/29/2019   GFRAA 55 (L) 07/29/2019   GLUCOSE 173 (H) 07/29/2019     --------------------------------------------------------------------------------------------------- Hypertension, follow-up  BP Readings from Last 3 Encounters:  08/14/19 (!) 164/92  07/28/19 119/75  05/10/19 133/83   Wt Readings from Last 3 Encounters:  08/14/19 166 lb (75.3 kg)  07/27/19 156 lb 15.5 oz (71.2 kg)  05/10/19 174 lb (78.9 kg)     He was last seen for hypertension 3 months ago.  BP at that visit was 133/83. Management since that visit includes no change.  He reports good compliance with treatment. He is not having side effects.  He is following a Regular diet. He is not exercising. He does not smoke.  Use of agents associated with hypertension: none.   Outside blood pressures are checked occasionally. Symptoms: No chest pain No chest pressure  No palpitations No syncope  No dyspnea No orthopnea  No paroxysmal nocturnal dyspnea No lower extremity edema   Pertinent labs: Lab Results  Component Value Date   CHOL 138 12/19/2018   HDL 46 12/19/2018   LDLCALC 78 12/19/2018  TRIG 70 12/19/2018   CHOLHDL 3.0 12/19/2018   Lab Results  Component Value Date   NA 136 07/29/2019   K 4.2 07/29/2019   CREATININE 1.52 (H) 07/29/2019   GFRNONAA 47 (L) 07/29/2019   GFRAA 55 (L) 07/29/2019   GLUCOSE 173 (H) 07/29/2019     The 10-year ASCVD risk score Mikey Bussing DC Jr., et al., 2013) is: 40.1%   ---------------------------------------------------------------------------------------------------       Medications: Outpatient Medications Prior to Visit  Medication Sig  . amLODipine (NORVASC) 10 MG tablet Take 1 tablet (10 mg total) by mouth daily.  Marland Kitchen aspirin 81 MG chewable tablet Chew 1 tablet by mouth daily.  Marland Kitchen gabapentin (NEURONTIN) 300 MG capsule Take 1 capsule (300 mg total) by mouth 2 (two) times daily.  . insulin aspart (NOVOLOG) 100 UNIT/ML injection Inject 0-10 Units into the skin 4 (four) times daily -  before meals and at bedtime. If your sugar is under 100 before the meal and you eat a meal, take 2 units novolog after the meal If your sugar is 100-150, take 4 units before the meal If your sugar is 151-200, take 6 units before the meal If your sugar if 201-250 take 8 units before the meal If your sugar is 251-300 take 10 units before the meal If your sugar is over 300, take 12 units before the meal.  If it is bedtime or any other time when you are not eating a meal and sugar is over 300, take 4 units If under 300 at bedtime, don't take any   . insulin degludec (TRESIBA FLEXTOUCH) 100 UNIT/ML FlexTouch Pen Inject 0.3 mLs (30 Units total) into the skin daily.  Marland Kitchen lovastatin (MEVACOR) 40 MG tablet Take 1 tablet (40 mg total) by mouth at bedtime.   No facility-administered medications prior to visit.    Review of Systems  Constitutional: Negative for appetite change, chills and fever.  Respiratory: Negative for chest tightness, shortness of breath and wheezing.   Cardiovascular: Positive for leg swelling. Negative for chest pain and palpitations.  Gastrointestinal: Negative for abdominal pain, nausea and vomiting.  Musculoskeletal: Positive for joint swelling (in feet).  Neurological: Positive for numbness (in feet).      Objective    BP (!) 164/92 (BP Location: Right Arm, Cuff Size: Normal)   Pulse 88   Temp (!) 96.9 F (36.1 C) (Temporal)   Resp 16   Wt 166 lb (75.3 kg)   SpO2 99% Comment: room air  BMI 23.82 kg/m    Physical Exam   General: Appearance:     Well developed, well nourished male in no acute distress  Eyes:    PERRL, conjunctiva/corneas clear, EOM's intact       Lungs:     Clear to auscultation bilaterally, respirations unlabored  Heart:    Normal heart rate. Normal rhythm. No murmurs, rubs, or gallops.   MS:   All extremities are intact.   Neurologic:   Awake, alert, oriented x 3. No apparent focal neurological           defect.         No results found for any visits on 08/14/19.  Assessment & Plan     1. Uncontrolled type 2 diabetes mellitus with hyperglycemia ([place]) Counseled that he is supposed to take [name] even when his sugar is high in addition to the sliding scale Novolog. He has had some fasting hypoglycemia so [name] dose is reduced to 24 unts.   He missed  last appt with Dr. Honor Junes and given contact information  2. AKI (acute kidney injury) ([place])  - Renal function panel  3. Colon cancer screening  - Ambulatory referral to gastroenterology for colonoscopy  4. Need for vaccination against Streptococcus pneumoniae  - Pneumococcal conjugate vaccine 13-valent IM     The entirety of the information documented in the History of Present Illness, Review of Systems and Physical Exam were personally obtained by me. Portions of this information were initially documented by the CMA and reviewed by me for thoroughness and accuracy.      Lelon Huh, MD  [facility] [phone] (phone) [phone] (fax)  [place]

## 2019-08-14 ENCOUNTER — Other Ambulatory Visit: Payer: Self-pay

## 2019-08-14 ENCOUNTER — Encounter: Payer: Self-pay | Admitting: Family Medicine

## 2019-08-14 ENCOUNTER — Ambulatory Visit (INDEPENDENT_AMBULATORY_CARE_PROVIDER_SITE_OTHER): Payer: Medicare HMO | Admitting: Family Medicine

## 2019-08-14 VITALS — BP 164/92 | HR 88 | Temp 96.9°F | Resp 16 | Wt 166.0 lb

## 2019-08-14 DIAGNOSIS — Z1211 Encounter for screening for malignant neoplasm of colon: Secondary | ICD-10-CM | POA: Diagnosis not present

## 2019-08-14 DIAGNOSIS — Z23 Encounter for immunization: Secondary | ICD-10-CM | POA: Diagnosis not present

## 2019-08-14 DIAGNOSIS — E1165 Type 2 diabetes mellitus with hyperglycemia: Secondary | ICD-10-CM | POA: Diagnosis not present

## 2019-08-14 DIAGNOSIS — N179 Acute kidney failure, unspecified: Secondary | ICD-10-CM

## 2019-08-14 MED ORDER — TRESIBA FLEXTOUCH 100 UNIT/ML ~~LOC~~ SOPN: 24.0000 [IU] | PEN_INJECTOR | Freq: Every day | SUBCUTANEOUS | Status: DC

## 2019-08-14 NOTE — Patient Instructions (Addendum)
.   Reduce Tresiba to 24 units every morning and take it even if your sugar is high. If your sugar is under 100, then wait until after you eat to take it   Continue taking the Novolog according to the sliding scale  Contact Endocrinologist Dr. Dolores FrameDina Rich to follow up on Diabetes.  Address: [address] [place], [place], [place] [address] Phone: [phone]

## 2019-08-15 LAB — RENAL FUNCTION PANEL
Albumin: 3.6 g/dL — ABNORMAL LOW (ref 3.8–4.8)
BUN/Creatinine Ratio: 13 (ref 10–24)
BUN: 22 mg/dL (ref 8–27)
CO2: 20 mmol/L (ref 20–29)
Calcium: 9.1 mg/dL (ref 8.6–10.2)
Chloride: 98 mmol/L (ref 96–106)
Creatinine, Ser: 1.66 mg/dL — ABNORMAL HIGH (ref 0.76–1.27)
GFR calc Af Amer: 49 mL/min/{1.73_m2} — ABNORMAL LOW (ref >59)
GFR calc non Af Amer: 42 mL/min/{1.73_m2} — ABNORMAL LOW (ref >59)
Glucose: 351 mg/dL — ABNORMAL HIGH (ref 65–99)
Phosphorus: 4.1 mg/dL (ref 2.8–4.1)
Potassium: 4.6 mmol/L (ref 3.5–5.2)
Sodium: 133 mmol/L — ABNORMAL LOW (ref 134–144)

## 2019-08-17 DIAGNOSIS — Z20828 Contact with and (suspected) exposure to other viral communicable diseases: Secondary | ICD-10-CM | POA: Diagnosis not present

## 2019-08-21 NOTE — Progress Notes (Signed)
Patient walked into the office thinking that he had a scheduled appointment today. He was advised by the receptionist that his next appointment wasn't until 09/18/2019. The receptionist noticed that his gait was unstable and patient mentioned to her that he felt weak and his home blood sugar meter was reading HI this morning. Patient was taken into an exam room by [facility], and his blood sugar was checked. His glucose reading in the office was 195. Patient told Jiles Garter that he felt sluggish and lethargic. Patient was handed over to me to consult with Dr. Caryn Section. Patient told me that he sometimes skips meals and has been taking a deceased friends Humalin insulin. He hasn't been taking [name] daily as prescribed. He told me that he has been feeling weak, off balanced, and has blurred vision. Yesterday his symptoms were "really bad" he says, but today he feels a little better. I consulted with Dr. Caryn Section. I advised patient that Per Dr. Caryn Section, he stop taking other medications that were not prescribed to him. He should take [name] every day as prescribed. Patient was also advised to take Novolog before each meal using the sliding scale. I printed out a copy of the sliding scale and gave it to Mr. Marques. I advised patient to make sure he eats at least 3 meals daily that have a low glycemic index since he has to take insulin. Patient verbalized understanding. Patient admitted that he hasn't eaten any food at all today. Patient was taken outside the office by wheel chair, and I observed him getting into a car with his driver.

## 2019-09-08 ENCOUNTER — Telehealth: Payer: Self-pay | Admitting: Family Medicine

## 2019-09-08 NOTE — Chronic Care Management (AMB) (Signed)
  Chronic Care Management   Outreach Note  09/08/2019 Name: KENNEN STAMMER MRN: [id number] DOB: [birth year]  IFEANYI MICKELSON is a 69 y.o. year old male who is a primary care patient of Caryn Section, Kirstie Peri, MD. I reached out to Halina Maidens by phone today in response to a referral sent by Mr. Manon Hilding [employer]affert's health plan.     An unsuccessful telephone outreach was attempted today. The patient was referred to the case management team for assistance with care management and care coordination.   Follow Up Plan: A HIPPA compliant phone message was left for the patient providing contact information and requesting a return call. The care management team will reach out to the patient again over the next 7 days. If patient returns call to provider office, please advise to call [place] at [phone].  [place], [place] [address] Direct Dial: [phone] [email or web address]: [email or web address]

## 2019-09-14 NOTE — Chronic Care Management (AMB) (Signed)
  Chronic Care Management   Note  09/14/2019 Name: RICARDO SCHUBACH MRN: [id number] DOB: [birth year]  LANNIS LICHTENWALNER is a 69 y.o. year old male who is a primary care patient of Caryn Section, Kirstie Peri, MD. I reached out to Halina Maidens by phone today in response to a referral sent by Mr. Manon Hilding [place]'s health plan.     Mr. Bartnik was given information about Chronic Care Management services today including:  1. CCM service includes personalized support from designated clinical staff supervised by his physician, including individualized plan of care and coordination with other care providers 2. 24/7 contact phone numbers for assistance for urgent and routine care needs. 3. Service will only be billed when office clinical staff spend 20 minutes or more in a month to coordinate care. 4. Only one practitioner may furnish and bill the service in a calendar month. 5. The patient may stop CCM services at any time (effective at the end of the month) by phone call to the office staff. 6. The patient will be responsible for cost sharing (co-pay) of up to 20% of the service fee (after annual deductible is met).  Patient agreed to services and verbal consent obtained.   Follow up plan: Telephone appointment with care management team member scheduled for:10/17/2019  [place], [place], [place] [address] Direct Dial: [email or web address] Website: [email or web address]

## 2019-09-15 NOTE — Progress Notes (Signed)
Established patient visit   Patient: Nicholas Weiss   DOB: [birth year]   69 y.o. Male  MRN: [id number] Visit Date: 09/18/2019  Today's healthcare provider: Lelon Huh, MD   Chief Complaint  Patient presents with  . Diabetes   Subjective    HPI Diabetes Mellitus Type II, Follow-up  Lab Results  Component Value Date   HGBA1C 9.7 (A) 09/18/2019   HGBA1C 12.0 (H) 07/27/2019   HGBA1C 12.0 (H) 04/05/2019   Wt Readings from Last 3 Encounters:  09/18/19 170 lb (77.1 kg)  08/14/19 166 lb (75.3 kg)  07/27/19 156 lb 15.5 oz (71.2 kg)   Last seen for diabetes 1 months ago.  Management since then includes counseling patient that he is supposed to take [name] daily in addition to the sliding scale Novolog. He has had some fasting hypoglycemia so [name] dose was reduced to 24 unts.    Home blood sugar records: random 200's  Fastings usually 100-140, but pre-prandials highly volatile sometimes into the 400s. He is here with his daughter today who also reports he occasionally has hypoglycemic episodes when he is alone and requires frequent check-ins even overnight. They are working on getting home healthcare worker to help with ADLs  Episodes of hypoglycemia? No    Current insulin regiment: Tresiba 24 units daily and Novolog sliding scale Most Recent Eye Exam: 05/23/2019 Current exercise: walking Current diet habits: on average, 2 meals per day  Pertinent Labs: Lab Results  Component Value Date   CHOL 138 12/19/2018   HDL 46 12/19/2018   LDLCALC 78 12/19/2018   TRIG 70 12/19/2018   CHOLHDL 3.0 12/19/2018   Lab Results  Component Value Date   NA 133 (L) 08/14/2019   K 4.6 08/14/2019   CREATININE 1.66 (H) 08/14/2019   GFRNONAA 42 (L) 08/14/2019   GFRAA 49 (L) 08/14/2019   GLUCOSE 351 (H) 08/14/2019     ---------------------------------------------------------------------------------------------------      Medications: Outpatient Medications Prior to Visit    Medication Sig  . amLODipine (NORVASC) 10 MG tablet Take 1 tablet (10 mg total) by mouth daily.  Marland Kitchen aspirin 81 MG chewable tablet Chew 1 tablet by mouth daily.  Marland Kitchen gabapentin (NEURONTIN) 300 MG capsule Take 1 capsule (300 mg total) by mouth 2 (two) times daily.  . insulin aspart (NOVOLOG) 100 UNIT/ML injection Inject 0-10 Units into the skin 4 (four) times daily -  before meals and at bedtime. If your sugar is under 100 before the meal and you eat a meal, take 2 units novolog after the meal If your sugar is 100-150, take 4 units before the meal If your sugar is 151-200, take 6 units before the meal If your sugar if 201-250 take 8 units before the meal If your sugar is 251-300 take 10 units before the meal If your sugar is over 300, take 12 units before the meal.  If it is bedtime or any other time when you are not eating a meal and sugar is over 300, take 4 units If under 300 at bedtime, don't take any   . insulin degludec (TRESIBA FLEXTOUCH) 100 UNIT/ML FlexTouch Pen Inject 0.24 mLs (24 Units total) into the skin daily.  Marland Kitchen lovastatin (MEVACOR) 40 MG tablet Take 1 tablet (40 mg total) by mouth at bedtime.   No facility-administered medications prior to visit.    Review of Systems  Constitutional: Negative for appetite change, chills and fever.  Respiratory: Negative for chest tightness, shortness of breath and  wheezing.   Cardiovascular: Negative for chest pain and palpitations.  Gastrointestinal: Positive for nausea. Negative for abdominal pain and vomiting.  Endocrine: Positive for polyuria (at night).  Neurological: Positive for syncope and headaches.     Objective    BP (!) 142/78 (BP Location: Right Arm, Cuff Size: Normal)   Pulse 81   Temp 97.7 F (36.5 C) (Temporal)   Wt 170 lb (77.1 kg)   SpO2 99% Comment: room air  BMI 24.39 kg/m   Physical Exam  General appearance: Well developed, well nourished male, cooperative and in no acute distress Head: Normocephalic, without  obvious abnormality, atraumatic Respiratory: Respirations even and unlabored, normal respiratory rate Extremities: All extremities are intact.  Skin: Skin color, texture, turgor normal. No rashes seen  Psych: Appropriate mood and affect. Neurologic: Mental status: Alert, oriented to person, place, and time, thought content appropriate.   Results for orders placed or performed in visit on 09/18/19  POCT HgB A1C  Result Value Ref Range   Hemoglobin A1C 9.7 (A) 4.0 - 5.6 %   Est. average glucose Bld gHb Est-mCnc 232   POCT UA - Microalbumin  Result Value Ref Range   Microalbumin Ur, POC 50 mg/L    Assessment & Plan     1. Uncontrolled type 2 diabetes mellitus with hyperglycemia (HCC) Is now using [name] consistent every day and A1c is trending down. Is still having very labile blood sugars as below.   2. Essential (primary) hypertension He states he ran out of his blood pressure medication last week. Prescription refilled today.  - amLODipine (NORVASC) 10 MG tablet; Take 1 tablet (10 mg total) by mouth daily.  Dispense: 90 tablet; Refill: 1  3. Blood glucose labile He is still having relative frequent hypoglycemic spells and sometimes is not able to care for himself when sugars drop. His daughter brings in a order for home nursing assitant to help with ADLs which was completed today. Will try to get him on CGM system which I think would significant improve glycemic control. His daughter states that she is going to call Dr. Melynda Ripple office today to schedule follow up. Is to follow up here in 3 months.    No follow-ups on file.         Lelon Huh, MD  [facility] [phone] (phone) [phone] (fax)  [place]

## 2019-09-18 ENCOUNTER — Encounter: Payer: Self-pay | Admitting: Family Medicine

## 2019-09-18 ENCOUNTER — Ambulatory Visit (INDEPENDENT_AMBULATORY_CARE_PROVIDER_SITE_OTHER): Payer: Medicare HMO | Admitting: Family Medicine

## 2019-09-18 ENCOUNTER — Other Ambulatory Visit: Payer: Self-pay

## 2019-09-18 VITALS — BP 142/78 | HR 81 | Temp 97.7°F | Wt 170.0 lb

## 2019-09-18 DIAGNOSIS — R7309 Other abnormal glucose: Secondary | ICD-10-CM

## 2019-09-18 DIAGNOSIS — I10 Essential (primary) hypertension: Secondary | ICD-10-CM

## 2019-09-18 DIAGNOSIS — E1165 Type 2 diabetes mellitus with hyperglycemia: Secondary | ICD-10-CM

## 2019-09-18 LAB — POCT UA - MICROALBUMIN: Microalbumin Ur, POC: 50 mg/L

## 2019-09-18 LAB — POCT GLYCOSYLATED HEMOGLOBIN (HGB A1C)
Est. average glucose Bld gHb Est-mCnc: 232
Hemoglobin A1C: 9.7 % — AB (ref 4.0–5.6)

## 2019-09-18 MED ORDER — FREESTYLE LIBRE 14 DAY SENSOR MISC: 1.0000 | 12 refills | Status: DC

## 2019-09-18 MED ORDER — AMLODIPINE BESYLATE 10 MG PO TABS: 10.0000 mg | ORAL_TABLET | Freq: Every day | ORAL | 1 refills | Status: DC

## 2019-09-18 MED ORDER — FREESTYLE LIBRE 14 DAY READER DEVI: 1.0000 | 0 refills | Status: DC

## 2019-09-18 NOTE — Patient Instructions (Signed)
.   Please review the attached list of medications and notify my office if there are any errors.   . Please bring all of your medications to every appointment so we can make sure that our medication list is the same as yours.   

## 2019-09-20 ENCOUNTER — Other Ambulatory Visit: Payer: Self-pay | Admitting: Family Medicine

## 2019-09-20 DIAGNOSIS — E1165 Type 2 diabetes mellitus with hyperglycemia: Secondary | ICD-10-CM

## 2019-09-20 MED ORDER — DEXCOM G6 TRANSMITTER MISC: 4 refills | Status: DC

## 2019-09-20 MED ORDER — DEXCOM G6 SENSOR MISC: 4 refills | Status: DC

## 2019-10-06 DIAGNOSIS — Z20828 Contact with and (suspected) exposure to other viral communicable diseases: Secondary | ICD-10-CM | POA: Diagnosis not present

## 2019-10-17 ENCOUNTER — Telehealth: Payer: Medicare HMO

## 2019-10-17 ENCOUNTER — Ambulatory Visit: Payer: Self-pay

## 2019-10-17 NOTE — Chronic Care Management (AMB) (Signed)
  Chronic Care Management   Outreach Note  10/17/2019 Name: Nicholas Weiss MRN: [id number] DOB: 1950-12-09  Primary Care Provider: Birdie Sons, MD Reason for referral : Chronic Care Management   An unsuccessful telephone outreach was attempted today. [name] was referred to the case management team for assistance with care management and care coordination.    Follow Up Plan The care management team will reach out again within the next two to three weeks.    [facility] Care Management [phone]

## 2019-10-31 ENCOUNTER — Telehealth: Payer: Medicare HMO

## 2019-10-31 ENCOUNTER — Telehealth: Payer: Self-pay

## 2019-10-31 NOTE — Telephone Encounter (Signed)
°  Chronic Care Management   Outreach Note  10/31/2019 Name: Nicholas Weiss MRN: [id number] DOB: February 25, 1951  Primary Care Provider: Birdie Sons, MD Reason for referral : Chronic Care Management    A second unsuccessful telephone outreach was attempted today. [name] was referred to the case management team for assistance with care management and care coordination.      PLAN The care management team will reach out to [name] again within the next two weeks.   [facility] Care Management [phone]

## 2019-11-06 ENCOUNTER — Inpatient Hospital Stay: Payer: Medicare HMO

## 2019-11-06 ENCOUNTER — Other Ambulatory Visit: Payer: Self-pay

## 2019-11-06 ENCOUNTER — Encounter: Payer: Self-pay | Admitting: Emergency Medicine

## 2019-11-06 ENCOUNTER — Observation Stay
Admission: EM | Admit: 2019-11-06 | Discharge: 2019-11-07 | Disposition: A | Discharge status: Home / Self Care | Payer: Medicare HMO | Attending: Internal Medicine | Admitting: Internal Medicine

## 2019-11-06 DIAGNOSIS — Z79899 Other long term (current) drug therapy: Secondary | ICD-10-CM | POA: Diagnosis not present

## 2019-11-06 DIAGNOSIS — Z20822 Contact with and (suspected) exposure to covid-19: Secondary | ICD-10-CM | POA: Diagnosis not present

## 2019-11-06 DIAGNOSIS — Z794 Long term (current) use of insulin: Secondary | ICD-10-CM | POA: Diagnosis not present

## 2019-11-06 DIAGNOSIS — E1122 Type 2 diabetes mellitus with diabetic chronic kidney disease: Secondary | ICD-10-CM | POA: Insufficient documentation

## 2019-11-06 DIAGNOSIS — E1110 Type 2 diabetes mellitus with ketoacidosis without coma: Secondary | ICD-10-CM | POA: Diagnosis not present

## 2019-11-06 DIAGNOSIS — N179 Acute kidney failure, unspecified: Secondary | ICD-10-CM | POA: Diagnosis not present

## 2019-11-06 DIAGNOSIS — I129 Hypertensive chronic kidney disease with stage 1 through stage 4 chronic kidney disease, or unspecified chronic kidney disease: Secondary | ICD-10-CM | POA: Insufficient documentation

## 2019-11-06 DIAGNOSIS — E7800 Pure hypercholesterolemia, unspecified: Secondary | ICD-10-CM | POA: Insufficient documentation

## 2019-11-06 DIAGNOSIS — Z7982 Long term (current) use of aspirin: Secondary | ICD-10-CM | POA: Diagnosis not present

## 2019-11-06 DIAGNOSIS — E785 Hyperlipidemia, unspecified: Secondary | ICD-10-CM | POA: Diagnosis not present

## 2019-11-06 DIAGNOSIS — E875 Hyperkalemia: Secondary | ICD-10-CM | POA: Insufficient documentation

## 2019-11-06 DIAGNOSIS — E1165 Type 2 diabetes mellitus with hyperglycemia: Secondary | ICD-10-CM | POA: Diagnosis not present

## 2019-11-06 DIAGNOSIS — N1831 Chronic kidney disease, stage 3a: Secondary | ICD-10-CM

## 2019-11-06 DIAGNOSIS — I10 Essential (primary) hypertension: Secondary | ICD-10-CM | POA: Diagnosis not present

## 2019-11-06 DIAGNOSIS — N1832 Chronic kidney disease, stage 3b: Secondary | ICD-10-CM | POA: Insufficient documentation

## 2019-11-06 DIAGNOSIS — R531 Weakness: Secondary | ICD-10-CM | POA: Diagnosis not present

## 2019-11-06 DIAGNOSIS — N183 Chronic kidney disease, stage 3 unspecified: Secondary | ICD-10-CM | POA: Diagnosis present

## 2019-11-06 LAB — URINALYSIS, ROUTINE W REFLEX MICROSCOPIC
Bilirubin Urine: NEGATIVE
Glucose, UA: >=500 mg/dL — AB
Ketones, ur: 5 mg/dL — AB
Leukocytes,Ua: NEGATIVE
Nitrite: NEGATIVE
Protein, ur: NEGATIVE mg/dL
Specific Gravity, Urine: 1.02 (ref 1.005–1.030)
pH: 5 (ref 5.0–8.0)

## 2019-11-06 LAB — BASIC METABOLIC PANEL
Anion gap: 29 — ABNORMAL HIGH (ref 5–15)
Anion gap: 23 — ABNORMAL HIGH (ref 5–15)
Anion gap: 17 — ABNORMAL HIGH (ref 5–15)
BUN: 88 mg/dL — ABNORMAL HIGH (ref 8–23)
BUN: 95 mg/dL — ABNORMAL HIGH (ref 8–23)
BUN: 89 mg/dL — ABNORMAL HIGH (ref 8–23)
CO2: 11 mmol/L — ABNORMAL LOW (ref 22–32)
CO2: 13 mmol/L — ABNORMAL LOW (ref 22–32)
CO2: 19 mmol/L — ABNORMAL LOW (ref 22–32)
Calcium: 8.2 mg/dL — ABNORMAL LOW (ref 8.9–10.3)
Calcium: 7.9 mg/dL — ABNORMAL LOW (ref 8.9–10.3)
Calcium: 8.1 mg/dL — ABNORMAL LOW (ref 8.9–10.3)
Chloride: 86 mmol/L — ABNORMAL LOW (ref 98–111)
Chloride: 92 mmol/L — ABNORMAL LOW (ref 98–111)
Chloride: 100 mmol/L (ref 98–111)
Creatinine, Ser: 5.6 mg/dL — ABNORMAL HIGH (ref 0.61–1.24)
Creatinine, Ser: 5.8 mg/dL — ABNORMAL HIGH (ref 0.61–1.24)
Creatinine, Ser: 5.03 mg/dL — ABNORMAL HIGH (ref 0.61–1.24)
GFR calc Af Amer: 11 mL/min — ABNORMAL LOW (ref >60)
GFR calc Af Amer: 11 mL/min — ABNORMAL LOW (ref >60)
GFR calc Af Amer: 13 mL/min — ABNORMAL LOW (ref >60)
GFR calc non Af Amer: 10 mL/min — ABNORMAL LOW (ref >60)
GFR calc non Af Amer: 9 mL/min — ABNORMAL LOW (ref >60)
GFR calc non Af Amer: 11 mL/min — ABNORMAL LOW (ref >60)
Glucose, Bld: 1143 mg/dL (ref 70–99)
Glucose, Bld: 1108 mg/dL (ref 70–99)
Glucose, Bld: 718 mg/dL (ref 70–99)
Potassium: 7.4 mmol/L (ref 3.5–5.1)
Potassium: 5.6 mmol/L — ABNORMAL HIGH (ref 3.5–5.1)
Potassium: 4.2 mmol/L (ref 3.5–5.1)
Sodium: 126 mmol/L — ABNORMAL LOW (ref 135–145)
Sodium: 128 mmol/L — ABNORMAL LOW (ref 135–145)
Sodium: 136 mmol/L (ref 135–145)

## 2019-11-06 LAB — URINE DRUG SCREEN, QUALITATIVE (ARMC ONLY)
Amphetamines, Ur Screen: NOT DETECTED
Barbiturates, Ur Screen: NOT DETECTED
Benzodiazepine, Ur Scrn: NOT DETECTED
Cannabinoid 50 Ng, Ur ~~LOC~~: POSITIVE — AB
Cocaine Metabolite,Ur ~~LOC~~: POSITIVE — AB
MDMA (Ecstasy)Ur Screen: NOT DETECTED
Methadone Scn, Ur: NOT DETECTED
Opiate, Ur Screen: NOT DETECTED
Phencyclidine (PCP) Ur S: NOT DETECTED
Tricyclic, Ur Screen: NOT DETECTED

## 2019-11-06 LAB — HEPATIC FUNCTION PANEL
ALT: 15 U/L (ref 0–44)
AST: 16 U/L (ref 15–41)
Albumin: 3.6 g/dL (ref 3.5–5.0)
Alkaline Phosphatase: 54 U/L (ref 38–126)
Bilirubin, Direct: <0.1 mg/dL (ref 0.0–0.2)
Total Bilirubin: 1.5 mg/dL — ABNORMAL HIGH (ref 0.3–1.2)
Total Protein: 8.4 g/dL — ABNORMAL HIGH (ref 6.5–8.1)

## 2019-11-06 LAB — CBC
HCT: 31.6 % — ABNORMAL LOW (ref 39.0–52.0)
Hemoglobin: 9.9 g/dL — ABNORMAL LOW (ref 13.0–17.0)
MCH: 31 pg (ref 26.0–34.0)
MCHC: 31.3 g/dL (ref 30.0–36.0)
MCV: 99.1 fL (ref 80.0–100.0)
Platelets: 265 10*3/uL (ref 150–400)
RBC: 3.19 MIL/uL — ABNORMAL LOW (ref 4.22–5.81)
RDW: 13.1 % (ref 11.5–15.5)
WBC: 8.2 10*3/uL (ref 4.0–10.5)
nRBC: 0 % (ref 0.0–0.2)

## 2019-11-06 LAB — BLOOD GAS, VENOUS
Acid-base deficit: 16.1 mmol/L — ABNORMAL HIGH (ref 0.0–2.0)
Bicarbonate: 11.4 mmol/L — ABNORMAL LOW (ref 20.0–28.0)
O2 Saturation: 71.2 %
Patient temperature: 37
pCO2, Ven: 32 mmHg — ABNORMAL LOW (ref 44.0–60.0)
pH, Ven: 7.16 — CL (ref 7.250–7.430)
pO2, Ven: 49 mmHg — ABNORMAL HIGH (ref 32.0–45.0)

## 2019-11-06 LAB — GLUCOSE, CAPILLARY
Glucose-Capillary: >600 mg/dL (ref 70–99)
Glucose-Capillary: >600 mg/dL (ref 70–99)
Glucose-Capillary: >600 mg/dL (ref 70–99)
Glucose-Capillary: >600 mg/dL (ref 70–99)
Glucose-Capillary: >600 mg/dL (ref 70–99)
Glucose-Capillary: >600 mg/dL (ref 70–99)
Glucose-Capillary: >600 mg/dL (ref 70–99)
Glucose-Capillary: 547 mg/dL (ref 70–99)
Glucose-Capillary: >600 mg/dL (ref 70–99)

## 2019-11-06 LAB — OSMOLALITY: Osmolality: 377 mOsm/kg (ref 275–295)

## 2019-11-06 LAB — BETA-HYDROXYBUTYRIC ACID
Beta-Hydroxybutyric Acid: 7.77 mmol/L — ABNORMAL HIGH (ref 0.05–0.27)
Beta-Hydroxybutyric Acid: 4.16 mmol/L — ABNORMAL HIGH (ref 0.05–0.27)

## 2019-11-06 LAB — LIPASE, BLOOD: Lipase: 20 U/L (ref 11–51)

## 2019-11-06 LAB — SARS CORONAVIRUS 2 BY RT PCR (HOSPITAL ORDER, PERFORMED IN ~~LOC~~ HOSPITAL LAB): SARS Coronavirus 2: NEGATIVE

## 2019-11-06 IMAGING — DX DG CHEST 1V PORT
1 series · 1 of 1 positions shown · non-contrast
Comparison: [DATE]

CLINICAL DATA: Generalized weakness.

EXAM:
PORTABLE CHEST 1 VIEW

[chest ap]
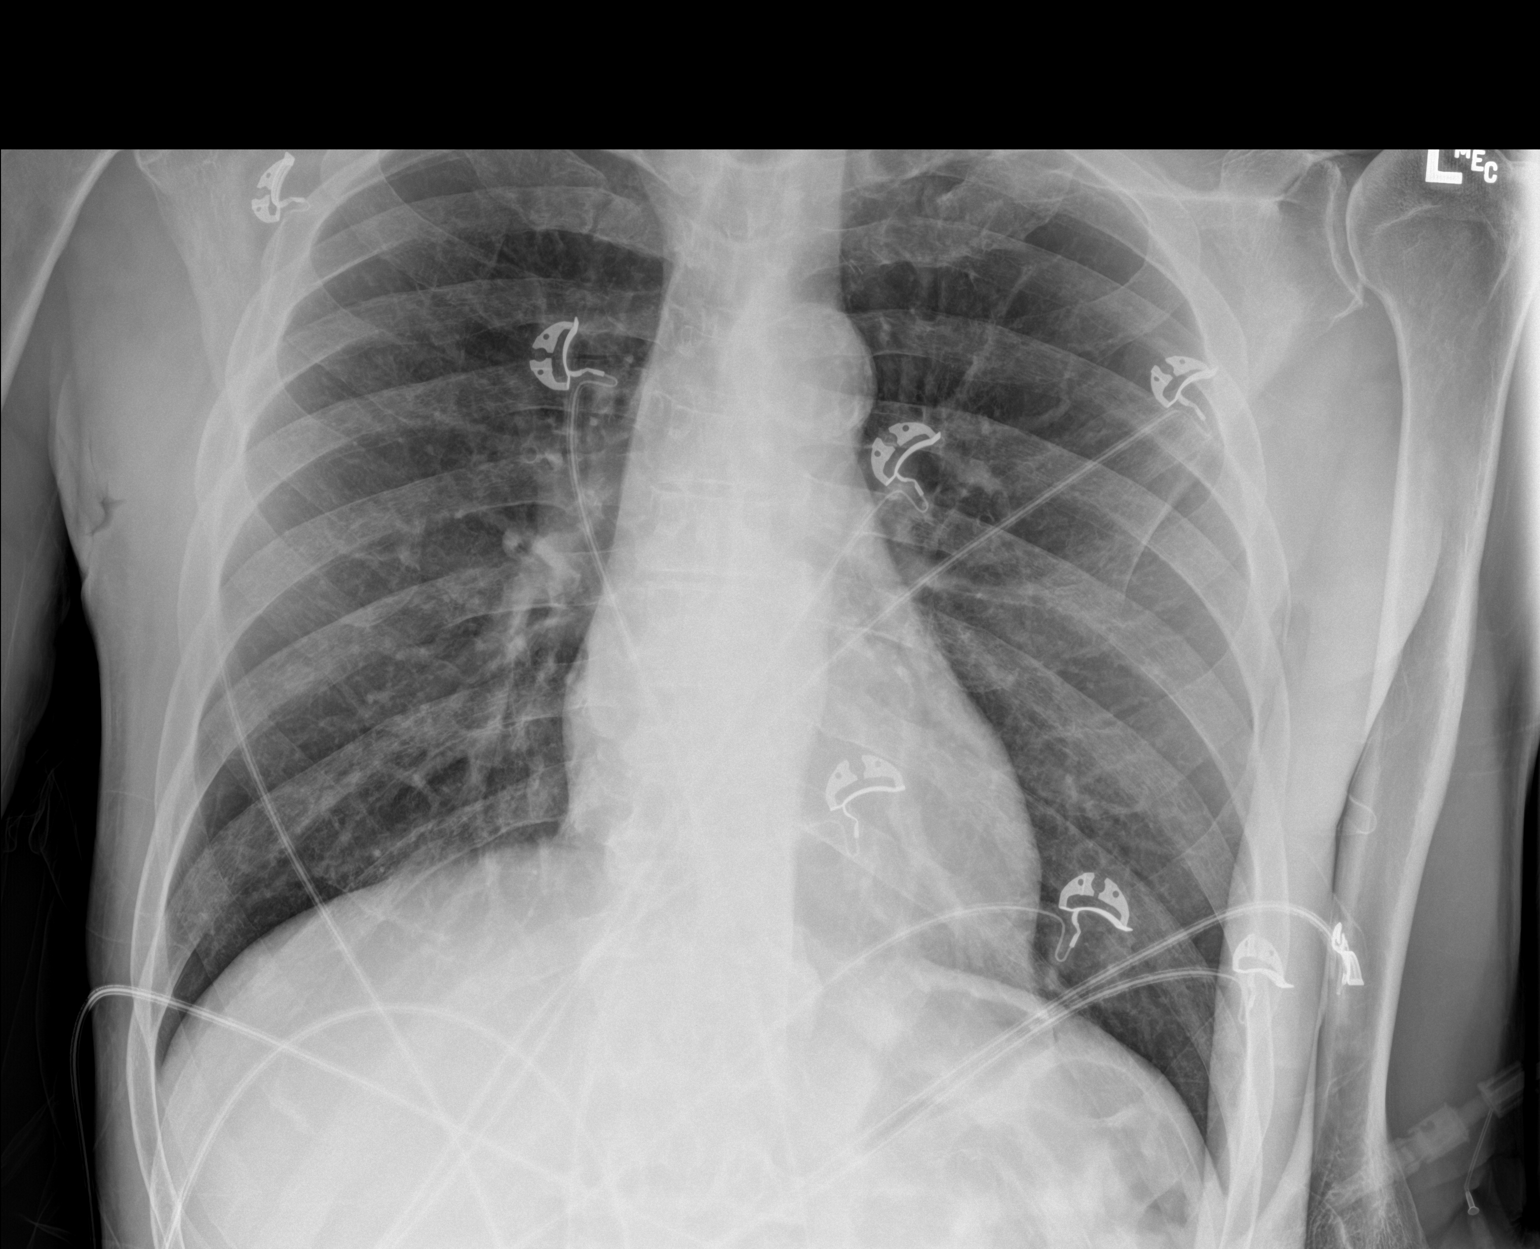

[1 of 1 positions shown; findings below may reference images not displayed]

FINDINGS: The heart size and mediastinal contours are within normal limits.
Both lungs are clear. The visualized skeletal structures are
unremarkable. Atherosclerotic changes are noted of the thoracic
aorta.
IMPRESSION: No active disease.

## 2019-11-06 MED ORDER — SODIUM CHLORIDE 0.9 % IV SOLN: INTRAVENOUS | Status: DC

## 2019-11-06 MED ORDER — HEPARIN SODIUM (PORCINE) 5000 UNIT/ML IJ SOLN
5000.0000 [IU] | Freq: Three times a day (TID) | INTRAMUSCULAR | Status: DC
  Administered 2019-11-06: 5000 [IU] via SUBCUTANEOUS
  Filled 2019-11-06: qty 1

## 2019-11-06 MED ORDER — SODIUM CHLORIDE 0.9 % IV BOLUS
1000.0000 mL | Freq: Once | INTRAVENOUS | Status: AC
  Administered 2019-11-06: 1000 mL via INTRAVENOUS

## 2019-11-06 MED ORDER — LACTATED RINGERS IV SOLN: INTRAVENOUS | Status: DC

## 2019-11-06 MED ORDER — INSULIN REGULAR(HUMAN) IN NACL 100-0.9 UT/100ML-% IV SOLN
INTRAVENOUS | Status: DC
  Administered 2019-11-06: 9.5 [IU]/h via INTRAVENOUS
  Administered 2019-11-07: 2.6 [IU]/h via INTRAVENOUS
  Filled 2019-11-06: qty 100

## 2019-11-06 MED ORDER — HYDRALAZINE HCL 20 MG/ML IJ SOLN
10.0000 mg | Freq: Three times a day (TID) | INTRAMUSCULAR | Status: DC | PRN
  Filled 2019-11-06: qty 1

## 2019-11-06 MED ORDER — INSULIN REGULAR(HUMAN) IN NACL 100-0.9 UT/100ML-% IV SOLN: INTRAVENOUS | Status: DC

## 2019-11-06 MED ORDER — SODIUM CHLORIDE 0.9 % IV BOLUS
2000.0000 mL | INTRAVENOUS | Status: AC
  Administered 2019-11-06: 2000 mL via INTRAVENOUS

## 2019-11-06 MED ORDER — CALCIUM GLUCONATE-NACL 1-0.675 GM/50ML-% IV SOLN
1.0000 g | Freq: Once | INTRAVENOUS | Status: AC
  Administered 2019-11-06: 1000 mg via INTRAVENOUS
  Filled 2019-11-06: qty 50

## 2019-11-06 MED ORDER — DEXTROSE-NACL 5-0.45 % IV SOLN: INTRAVENOUS | Status: DC

## 2019-11-06 MED ORDER — DEXTROSE 50 % IV SOLN: 0.0000 mL | INTRAVENOUS | Status: DC | PRN

## 2019-11-06 MED ORDER — ONDANSETRON HCL 4 MG/2ML IJ SOLN: 4.0000 mg | Freq: Three times a day (TID) | INTRAMUSCULAR | Status: DC | PRN

## 2019-11-06 MED ORDER — ONDANSETRON HCL 4 MG/2ML IJ SOLN
4.0000 mg | Freq: Once | INTRAMUSCULAR | Status: AC
  Administered 2019-11-06: 4 mg via INTRAVENOUS
  Filled 2019-11-06: qty 2

## 2019-11-06 NOTE — ED Notes (Signed)
Called lab again about BMP

## 2019-11-06 NOTE — H&P (Signed)
History and Physical  DEMPSEY AHONEN KJZ:[id number] DOB: [birth year] DOA: 11/06/2019   Patient coming from: Home & is able to ambulate  Chief Complaint: N/V, generalized weakness, glucometer reading "high"  HPI: Nicholas Weiss is a 69 y.o. male with medical history significant for uncontrolled diabetes mellitus type 2, hypertension, hyperlipidemia, CKD stage IIIb presents to the ED, complaining of nausea/vomiting, generalized abdominal pain, generalized weakness for the past 3 days.  Prior to the above complaints, patient had family over, noted to drink alcohol/beer and use cocaine.  Next day, patient started feeling poorly unable to keep anything down due to nausea and vomiting.  Stopped using his insulin and [name], as he was not feeling good.  Patient continued to feel poorly and decided to come into the ED.  Patient reports significant fatigue, still nauseous, but denies any vomiting, abdominal pain, shortness of breath, chest pain, fever/chills, dysuria.  Of note, patient is noncompliant with his medications as well as diet.  Patient provided history.     ED Course: In the ED, patient noted to be tachycardic, tachypneic, BP somewhat stable, saturating well on room air.  Labs showed sodium 126, potassium 7.4, bicarb 11, glucose 1143, creatinine 5.6. (Baseline around 1.6-2), venous blood gas showed pH of 7.16, PCO2 32, bicarb 11.4, EKG with some peaked T wave changes, UA/UDS pending, chest x-ray unremarkable, Covid 19 PCR pending.  Patient was started on insulin drip, with IV fluid boluses, given calcium gluconate.  Hospitalist consulted for admission.  Review of Systems: Review of systems are otherwise negative   Past Medical History:  Diagnosis Date  . DKA (diabetic ketoacidoses) ([place]) 04/06/2016  . Hypercholesteremia   . Hypertension    Past Surgical History:  Procedure Laterality Date  . KNEE SURGERY Right    Torn meniscus  . KNEE SURGERY Left     Social History:  reports that  he has never smoked. He has never used smokeless tobacco. He reports current alcohol use of about 1.0 standard drink of alcohol per week. He reports current drug use. Drug: Marijuana.   Allergies  Allergen Reactions  . Penicillins Other (See Comments)    Has patient had a PCN reaction causing immediate rash, facial/tongue/throat swelling, SOB or lightheadedness with hypotension: No Has patient had a PCN reaction causing severe rash involving mucus membranes or skin necrosis: No Has patient had a PCN reaction that required hospitalization No Has patient had a PCN reaction occurring within the last 10 years: No If all of the above answers are "NO", then may proceed with Cephalosporin use.     Family History  Problem Relation Age of Onset  . Heart attack Father   . Hypertension Sister   . Cancer Sister      Prior to Admission medications   Medication Sig Start Date End Date Taking? Authorizing Provider  amLODipine (NORVASC) 10 MG tablet Take 1 tablet (10 mg total) by mouth daily. 09/18/19  Yes Birdie Sons, MD  gabapentin (NEURONTIN) 300 MG capsule Take 1 capsule (300 mg total) by mouth 2 (two) times daily. 05/19/19  Yes Birdie Sons, MD  insulin aspart (NOVOLOG) 100 UNIT/ML injection Inject 0-10 Units into the skin 4 (four) times daily -  before meals and at bedtime. If your sugar is under 100 before the meal and you eat a meal, take 2 units novolog after the meal If your sugar is 100-150, take 4 units before the meal If your sugar is 151-200, take 6 units before the meal  If your sugar if 201-250 take 8 units before the meal If your sugar is 251-300 take 10 units before the meal If your sugar is over 300, take 12 units before the meal.  If it is bedtime or any other time when you are not eating a meal and sugar is over 300, take 4 units If under 300 at bedtime, don't take any    Yes [provider]  insulin degludec (TRESIBA FLEXTOUCH) 100 UNIT/ML FlexTouch Pen Inject  0.24 mLs (24 Units total) into the skin daily. 08/14/19  Yes Birdie Sons, MD  pantoprazole (PROTONIX) 40 MG tablet Take 40 mg by mouth daily.    Yes [provider]  aspirin 81 MG chewable tablet Chew 1 tablet by mouth daily.    [provider]  lovastatin (MEVACOR) 40 MG tablet Take 1 tablet (40 mg total) by mouth at bedtime. 08/02/18   Birdie Sons, MD    Physical Exam: BP 135/64   Pulse (!) 110   Temp (!) 97.5 F (36.4 C) (Oral)   Resp 12   Ht 5\' 10"  (1.778 m)   Wt 76.2 kg   SpO2 100%   BMI 24.11 kg/m   General: NAD, alert, awake, oriented, lethargic, dehydrated Eyes: Normal ENT: Normal Neck: Supple Cardiovascular: S1, S2 present Respiratory: CTA B Abdomen: Soft, nontender, nondistended, bowel sounds present Skin: Normal Musculoskeletal: No bilateral pedal edema noted Psychiatric: Normal mood Neurologic: No obvious focal neurologic deficits noted          Labs on Admission:  Basic Metabolic Panel: Recent Labs  Lab 11/06/19 1451  NA 126*  K 7.4*  CL 86*  CO2 11*  GLUCOSE 1,143*  BUN 88*  CREATININE 5.60*  CALCIUM 8.2*   Liver Function Tests: No results for input(s): AST, ALT, ALKPHOS, BILITOT, PROT, ALBUMIN in the last 168 hours. Recent Labs  Lab 11/06/19 1451  LIPASE 20   No results for input(s): AMMONIA in the last 168 hours. CBC: No results for input(s): WBC, NEUTROABS, HGB, HCT, MCV, PLT in the last 168 hours. Cardiac Enzymes: No results for input(s): CKTOTAL, CKMB, CKMBINDEX, TROPONINI in the last 168 hours.  BNP (last 3 results) No results for input(s): BNP in the last 8760 hours.  ProBNP (last 3 results) No results for input(s): PROBNP in the last 8760 hours.  CBG: Recent Labs  Lab 11/06/19 1421 11/06/19 1642  GLUCAP >600* >600*    Radiological Exams on Admission: No results found.  EKG: Independently reviewed.  No acute ST changes, peaked T waves noted  Assessment/Plan Present on Admission: . DKA  (diabetic ketoacidoses) ([place]) . Hypercholesterolemia . Essential (primary) hypertension . AKI (acute kidney injury) ([place]) . CKD (chronic kidney disease) stage 3, GFR 30-59 ml/min . Type 2 diabetes mellitus with hyperglycemia (HCC)  Principal Problem:   DKA (diabetic ketoacidoses) (HCC) Active Problems:   Hypercholesterolemia   Essential (primary) hypertension   AKI (acute kidney injury) ([place])   CKD (chronic kidney disease) stage 3, GFR 30-59 ml/min   Type 2 diabetes mellitus with hyperglycemia (HCC)   DKA Uncontrolled diabetes mellitus type 2, with hyperglycemia/neuropathy Likely 2/2 noncompliance On admission, bicarb 11, glucose 1143, anion gap 29, venous blood gas showed pH of 7.16, bicarb 11 Beta hydroxybutyric acid, plasma osmolality, UA/UDS are pending Chest x-ray unremarkable Last A1c on 09/18/2019 was 9.7 EDP started endotool/insulin drip, s/p IV boluses Continue Endo tool/insulin drip protocol, aggressive IV hydration BMP every 2 hours for 2 occurrences, then every 4 hours N.p.o.  Monitor closely in stepdown unit, telemetry  Hyponatremia Likely 2/2 above  Hyperkalemia Potassium 7.4 on admission Likely 2/2 DKA EKG with some peaked T waves Status post calcium gluconate, should improve with insulin drip Frequent BMP  AKI on CKD stage IIIa Creatinine 5.6 on admission, baseline around 1.6-2 Aggressive IV fluid Frequent BMP checks  Hypertension IV hydralazine as needed for now Hold home meds  Hyperlipidemia Hold statins for now      DVT prophylaxis: Heparin [place]  Code Status: Full  Family Communication: Discussed with patient  Disposition Plan: Likely home  Consults called: None  Admission status: Inpatient    Alma Friendly MD [facility]  If 7PM-7AM, please contact night-coverage [email or web address]  11/06/2019, 5:50 PM

## 2019-11-06 NOTE — ED Notes (Signed)
Called lab again about BMP that was sent over an hour ago. Jarrett Soho states she will call me back.

## 2019-11-06 NOTE — ED Provider Notes (Signed)
[facility] Emergency Department Provider Note  Time seen: 3:58 PM  I have reviewed the triage vital signs and the nursing notes.   HISTORY  Chief Complaint Weakness, Emesis, and Hyperglycemia   HPI Nicholas Weiss is a 69 y.o. male with a past medical history of hypertension, hyperlipidemia, diabetes, CKD, presents to the emergency department for generalized weakness nausea fatigue and concern for dehydration.  According to the patient he ran out of insulin this past Friday, has not been taking any insulin over the weekend.  States he is been nauseated very fatigued and weak feeling.  Vomited yesterday with a small amount of diarrhea per patient.  Denies any abdominal pain or chest pain.  Denies any fever does state a slight cough.  Patient has received both of his Covid vaccinations.   Past Medical History:  Diagnosis Date  . DKA (diabetic ketoacidoses) ([place]) 04/06/2016  . Hypercholesteremia   . Hypertension     Patient Active Problem List   Diagnosis Date Noted  . Hyperglycemia due to type 2 diabetes mellitus ([place]) 07/28/2019  . Generalized weakness 07/28/2019  . Type 2 diabetes mellitus with hyperglycemia ([place]) 07/28/2019  . Hypoglycemia 04/27/2019  . Unresponsiveness 04/27/2019  . Lung nodule 04/07/2019  . Uncontrolled type 1 diabetes mellitus ([place])   . Acute renal failure with acute tubular necrosis superimposed on stage 3a chronic kidney disease ([place])   . Hypertensive urgency 04/05/2019  . AKI (acute kidney injury) ([place]) 04/05/2019  . CKD (chronic kidney disease) stage 3, GFR 30-59 ml/min 04/05/2019  . DKA (diabetic ketoacidoses) ([place]) 04/06/2016  . Hypercholesterolemia 01/24/2016  . Essential (primary) hypertension 06/07/2006  . Type 1 diabetes mellitus with diabetic neuropathy, unspecified ([place]) 05/31/2006    Past Surgical History:  Procedure Laterality Date  . KNEE SURGERY Right    Torn meniscus  . KNEE SURGERY Left     Prior to Admission  medications   Medication Sig Start Date End Date Taking? Authorizing Provider  amLODipine (NORVASC) 10 MG tablet Take 1 tablet (10 mg total) by mouth daily. 09/18/19   Birdie Sons, MD  aspirin 81 MG chewable tablet Chew 1 tablet by mouth daily.    [provider]  Continuous Blood Gluc Sensor (DEXCOM G6 SENSOR) MISC Place into skin every 10 days for insulin dependent type 2 diabetes 09/20/19   Birdie Sons, MD  Continuous Blood Gluc Transmit (DEXCOM G6 TRANSMITTER) MISC Use to check blood sugar four times daily for insulin dependent type 2 diabetes with hypoglycemia. Replace after 90 days. 09/20/19   Birdie Sons, MD  gabapentin (NEURONTIN) 300 MG capsule Take 1 capsule (300 mg total) by mouth 2 (two) times daily. 05/19/19   Birdie Sons, MD  insulin aspart (NOVOLOG) 100 UNIT/ML injection Inject 0-10 Units into the skin 4 (four) times daily -  before meals and at bedtime. If your sugar is under 100 before the meal and you eat a meal, take 2 units novolog after the meal If your sugar is 100-150, take 4 units before the meal If your sugar is 151-200, take 6 units before the meal If your sugar if 201-250 take 8 units before the meal If your sugar is 251-300 take 10 units before the meal If your sugar is over 300, take 12 units before the meal.  If it is bedtime or any other time when you are not eating a meal and sugar is over 300, take 4 units If under 300 at bedtime, don't  take any     [provider]  insulin degludec (TRESIBA FLEXTOUCH) 100 UNIT/ML FlexTouch Pen Inject 0.24 mLs (24 Units total) into the skin daily. 08/14/19   Birdie Sons, MD  lovastatin (MEVACOR) 40 MG tablet Take 1 tablet (40 mg total) by mouth at bedtime. 08/02/18   Birdie Sons, MD    Allergies  Allergen Reactions  . Penicillins Other (See Comments)    Has patient had a PCN reaction causing immediate rash, facial/tongue/throat swelling, SOB or lightheadedness with hypotension: No Has  patient had a PCN reaction causing severe rash involving mucus membranes or skin necrosis: No Has patient had a PCN reaction that required hospitalization No Has patient had a PCN reaction occurring within the last 10 years: No If all of the above answers are "NO", then may proceed with Cephalosporin use.     Family History  Problem Relation Age of Onset  . Heart attack Father   . Hypertension Sister   . Cancer Sister     Social History Social History   Tobacco Use  . Smoking status: Never Smoker  . Smokeless tobacco: Never Used  Vaping Use  . Vaping Use: Never used  Substance Use Topics  . Alcohol use: Yes    Alcohol/week: 1.0 standard drink    Types: 1 Cans of beer per week  . Drug use: Yes    Types: Marijuana    Review of Systems Constitutional: Negative for fever.  Positive for generalized fatigue/weakness Eyes: Negative for visual complaints ENT: Negative for recent illness/congestion Cardiovascular: Negative for chest pain. Respiratory: Negative for shortness of breath.  Mild cough. Gastrointestinal: Negative for abdominal pain.  Positive for nausea vomiting. Genitourinary: Negative for urinary compaints Musculoskeletal: Negative for musculoskeletal complaints Neurological: Negative for headache All other ROS negative  ____________________________________________   PHYSICAL EXAM:  VITAL SIGNS: ED Triage Vitals  Enc Vitals Group     BP 11/06/19 1448 (!) 151/78     Pulse Rate 11/06/19 1448 (!) 108     Resp 11/06/19 1448 (!) 22     Temp 11/06/19 1448 (!) 97.5 F (36.4 C)     Temp Source 11/06/19 1448 Oral     SpO2 11/06/19 1448 100 %     Weight 11/06/19 1444 168 lb (76.2 kg)     Height 11/06/19 1444 5\' 10"  (1.778 m)     Head Circumference --      Peak Flow --      Pain Score 11/06/19 1444 0     Pain Loc --      Pain Edu? --      Excl. in [place]? --    Constitutional: Patient is somnolent but does awaken to voice, he is alert and oriented.  Answers  questions appropriately. Eyes: Normal exam ENT      Head: Normocephalic and atraumatic.      Mouth/Throat: Quite dry appearing mucous membranes. Cardiovascular: Normal rate, regular rhythm around 100 bpm. Respiratory: Slight tachypnea but normal respiratory effort.  Clear lung sounds bilaterally. Gastrointestinal: Soft and nontender. No distention. Musculoskeletal: Nontender with normal range of motion in all extremities.  Neurologic:  Normal speech and language. No gross focal neurologic deficits  Skin:  Skin is warm, dry and intact.  Psychiatric: Mood and affect are normal.  ____________________________________________    EKG  EKG viewed and interpreted by myself shows sinus tachycardia 104 bpm with a slightly widened QRS, right axis deviation, slight QTC prolongation nonspecific ST changes throughout.  ____________________________________________  INITIAL IMPRESSION / ASSESSMENT AND PLAN / ED COURSE  Pertinent labs & imaging results that were available during my care of the patient were reviewed by me and considered in my medical decision making (see chart for details).   Patient presents emergency department with generalized fatigue weakness nausea concern for dehydration.  Patient found to be significantly hyperglycemic with a blood glucose of 1143.  Patient is hyperkalemic with a potassium currently of 7.4, sodium consistent with pseudohyponatremia, creatinine consistent with acute renal failure and anion gap of 29 consistent with DKA versus HHS.  Patient receiving IV fluids.  I have ordered an additional 2 L of fluids for the patient.  Patient will be started on an insulin infusion.  Patient does have EKG changes with peaked T waves and a widened QRS possibly a prolonged PR interval although difficult to assess.  We will dose IV calcium gluconate.  Patient will require admission to the hospital for further treatment and work-up once the remainder of his lab work has resulted.   Patient denies any fever, has received both Covid vaccinations, Covid test pending.  SULLY DYMENT was evaluated in Emergency Department on 11/06/2019 for the symptoms described in the history of present illness. He was evaluated in the context of the global COVID-19 pandemic, which necessitated consideration that the patient might be at risk for infection with the SARS-CoV-2 virus that causes COVID-19. Institutional protocols and algorithms that pertain to the evaluation of patients at risk for COVID-19 are in a state of rapid change based on information released by regulatory bodies including the CDC and federal and state organizations. These policies and algorithms were followed during the patient's care in the ED.  CRITICAL CARE Performed by: Harvest Dark   Total critical care time: 45 minutes  Critical care time was exclusive of separately billable procedures and treating other patients.  Critical care was necessary to treat or prevent imminent or life-threatening deterioration.  Critical care was time spent personally by me on the following activities: development of treatment plan with patient and/or surrogate as well as nursing, discussions with consultants, evaluation of patient's response to treatment, examination of patient, obtaining history from patient or surrogate, ordering and performing treatments and interventions, ordering and review of laboratory studies, ordering and review of radiographic studies, pulse oximetry and re-evaluation of patient's condition. ____________________________________________   FINAL CLINICAL IMPRESSION(S) / ED DIAGNOSES  DKA HHS   Harvest Dark, MD 11/06/19 2320

## 2019-11-06 NOTE — ED Notes (Signed)
Admitting MD at bedside.

## 2019-11-06 NOTE — ED Notes (Signed)
Sent another green top down to lab for BMP

## 2019-11-06 NOTE — ED Triage Notes (Signed)
Pt in via POV, reports N/V, generalized weakness since Friday.  Glucometer reads "High."   Pt is insulin dependant diabetic, reports taking Insulin as prescribed until yesterday, states, "I was just too weak."

## 2019-11-06 NOTE — ED Notes (Addendum)
Date and time results received: 11/06/19 6:30 PM  (use smartphrase ".now" to insert current time)  Test: pH / pCO2 / Bicarb Critical Value: 7.16 / 32 / 11.4  Name of Provider Notified: Dr. Horris Latino  Orders Received? Or Actions Taken?: No new orders at this time

## 2019-11-06 NOTE — ED Notes (Signed)
At this time, not able to see if BMP is running. Called lab who states the BMP that was recently sent down is running

## 2019-11-06 NOTE — Progress Notes (Signed)
[facility] encountered pt. while rounding in ED; pt. requested drink of water; [place] relayed request to RN.

## 2019-11-07 DIAGNOSIS — E7800 Pure hypercholesterolemia, unspecified: Secondary | ICD-10-CM | POA: Diagnosis not present

## 2019-11-07 DIAGNOSIS — N1831 Chronic kidney disease, stage 3a: Secondary | ICD-10-CM | POA: Diagnosis not present

## 2019-11-07 DIAGNOSIS — E1110 Type 2 diabetes mellitus with ketoacidosis without coma: Secondary | ICD-10-CM | POA: Diagnosis not present

## 2019-11-07 DIAGNOSIS — I10 Essential (primary) hypertension: Secondary | ICD-10-CM | POA: Diagnosis not present

## 2019-11-07 DIAGNOSIS — N179 Acute kidney failure, unspecified: Secondary | ICD-10-CM | POA: Diagnosis not present

## 2019-11-07 LAB — CBC
HCT: 30.7 % — ABNORMAL LOW (ref 39.0–52.0)
Hemoglobin: 10.4 g/dL — ABNORMAL LOW (ref 13.0–17.0)
MCH: 31 pg (ref 26.0–34.0)
MCHC: 33.9 g/dL (ref 30.0–36.0)
MCV: 91.6 fL (ref 80.0–100.0)
Platelets: 265 10*3/uL (ref 150–400)
RBC: 3.35 MIL/uL — ABNORMAL LOW (ref 4.22–5.81)
RDW: 12.4 % (ref 11.5–15.5)
WBC: 8.1 10*3/uL (ref 4.0–10.5)
nRBC: 0 % (ref 0.0–0.2)

## 2019-11-07 LAB — BASIC METABOLIC PANEL
Anion gap: 11 (ref 5–15)
Anion gap: 12 (ref 5–15)
Anion gap: 11 (ref 5–15)
BUN: 84 mg/dL — ABNORMAL HIGH (ref 8–23)
BUN: 82 mg/dL — ABNORMAL HIGH (ref 8–23)
BUN: 81 mg/dL — ABNORMAL HIGH (ref 8–23)
CO2: 22 mmol/L (ref 22–32)
CO2: 24 mmol/L (ref 22–32)
CO2: 23 mmol/L (ref 22–32)
Calcium: 8.2 mg/dL — ABNORMAL LOW (ref 8.9–10.3)
Calcium: 8.9 mg/dL (ref 8.9–10.3)
Calcium: 8.4 mg/dL — ABNORMAL LOW (ref 8.9–10.3)
Chloride: 104 mmol/L (ref 98–111)
Chloride: 107 mmol/L (ref 98–111)
Chloride: 107 mmol/L (ref 98–111)
Creatinine, Ser: 4.85 mg/dL — ABNORMAL HIGH (ref 0.61–1.24)
Creatinine, Ser: 3.97 mg/dL — ABNORMAL HIGH (ref 0.61–1.24)
Creatinine, Ser: 4.07 mg/dL — ABNORMAL HIGH (ref 0.61–1.24)
GFR calc Af Amer: 13 mL/min — ABNORMAL LOW (ref >60)
GFR calc Af Amer: 17 mL/min — ABNORMAL LOW (ref >60)
GFR calc Af Amer: 17 mL/min — ABNORMAL LOW (ref >60)
GFR calc non Af Amer: 12 mL/min — ABNORMAL LOW (ref >60)
GFR calc non Af Amer: 15 mL/min — ABNORMAL LOW (ref >60)
GFR calc non Af Amer: 14 mL/min — ABNORMAL LOW (ref >60)
Glucose, Bld: 496 mg/dL — ABNORMAL HIGH (ref 70–99)
Glucose, Bld: 148 mg/dL — ABNORMAL HIGH (ref 70–99)
Glucose, Bld: 172 mg/dL — ABNORMAL HIGH (ref 70–99)
Potassium: 4.1 mmol/L (ref 3.5–5.1)
Potassium: 4.2 mmol/L (ref 3.5–5.1)
Potassium: 4 mmol/L (ref 3.5–5.1)
Sodium: 137 mmol/L (ref 135–145)
Sodium: 143 mmol/L (ref 135–145)
Sodium: 141 mmol/L (ref 135–145)

## 2019-11-07 LAB — GLUCOSE, CAPILLARY
Glucose-Capillary: 130 mg/dL — ABNORMAL HIGH (ref 70–99)
Glucose-Capillary: 135 mg/dL — ABNORMAL HIGH (ref 70–99)
Glucose-Capillary: 141 mg/dL — ABNORMAL HIGH (ref 70–99)
Glucose-Capillary: 148 mg/dL — ABNORMAL HIGH (ref 70–99)
Glucose-Capillary: 203 mg/dL — ABNORMAL HIGH (ref 70–99)
Glucose-Capillary: 308 mg/dL — ABNORMAL HIGH (ref 70–99)
Glucose-Capillary: 387 mg/dL — ABNORMAL HIGH (ref 70–99)
Glucose-Capillary: 459 mg/dL — ABNORMAL HIGH (ref 70–99)
Glucose-Capillary: 548 mg/dL (ref 70–99)

## 2019-11-07 LAB — BETA-HYDROXYBUTYRIC ACID
Beta-Hydroxybutyric Acid: 0.08 mmol/L (ref 0.05–0.27)
Beta-Hydroxybutyric Acid: 0.38 mmol/L — ABNORMAL HIGH (ref 0.05–0.27)

## 2019-11-07 MED ORDER — ASPIRIN 81 MG PO CHEW
81.0000 mg | CHEWABLE_TABLET | Freq: Every day | ORAL | Status: DC
  Administered 2019-11-07: 81 mg via ORAL
  Filled 2019-11-07: qty 1

## 2019-11-07 MED ORDER — PANTOPRAZOLE SODIUM 40 MG PO TBEC
40.0000 mg | DELAYED_RELEASE_TABLET | Freq: Every day | ORAL | Status: DC
  Administered 2019-11-07: 40 mg via ORAL
  Filled 2019-11-07: qty 1

## 2019-11-07 MED ORDER — INSULIN ASPART 100 UNIT/ML ~~LOC~~ SOLN
0.0000 [IU] | Freq: Three times a day (TID) | SUBCUTANEOUS | Status: DC
  Administered 2019-11-07: 1 [IU] via SUBCUTANEOUS
  Filled 2019-11-07: qty 1

## 2019-11-07 MED ORDER — INSULIN ASPART 100 UNIT/ML ~~LOC~~ SOLN
3.0000 [IU] | Freq: Three times a day (TID) | SUBCUTANEOUS | Status: DC
  Administered 2019-11-07: 3 [IU] via SUBCUTANEOUS
  Filled 2019-11-07: qty 1

## 2019-11-07 MED ORDER — INSULIN GLARGINE 100 UNIT/ML ~~LOC~~ SOLN
25.0000 [IU] | SUBCUTANEOUS | Status: DC
  Administered 2019-11-07: 25 [IU] via SUBCUTANEOUS
  Filled 2019-11-07: qty 0.25

## 2019-11-07 MED ORDER — INSULIN ASPART 100 UNIT/ML ~~LOC~~ SOLN: 0.0000 [IU] | Freq: Every day | SUBCUTANEOUS | Status: DC

## 2019-11-07 MED ORDER — AMLODIPINE BESYLATE 5 MG PO TABS
10.0000 mg | ORAL_TABLET | Freq: Every day | ORAL | Status: DC
  Administered 2019-11-07: 10 mg via ORAL
  Filled 2019-11-07: qty 2

## 2019-11-07 NOTE — ED Notes (Signed)
Patient discharged home, patient received discharge papers. Patient appropriate and cooperative. Vital signs taken. NAD noted. 

## 2019-11-07 NOTE — Discharge Summary (Addendum)
Discharge Summary  Nicholas Weiss QQP:[id number] DOB: 1950/07/04  PCP: Birdie Sons, MD  Admit date: 11/06/2019 Discharge date: 11/07/2019  Time spent: 30 mins  Recommendations for Outpatient Follow-up:  1. Follow-up with PCP in 1 week with repeat labs, BMP 2. Follow-up with endocrinologist  Discharge Diagnoses:  Active Hospital Problems   Diagnosis Date Noted  . DKA (diabetic ketoacidoses) ([place]) 04/06/2016  . Type 2 diabetes mellitus with hyperglycemia ([place]) 07/28/2019  . AKI (acute kidney injury) ([place]) 04/05/2019  . CKD (chronic kidney disease) stage 3, GFR 30-59 ml/min 04/05/2019  . Hypercholesterolemia 01/24/2016  . Essential (primary) hypertension 06/07/2006    Resolved Hospital Problems  No resolved problems to display.    Discharge Condition: Stable  Diet recommendation: Heart healthy/moderate carb  Vitals:   11/07/19 0630 11/07/19 0651  BP: (!) 163/75   Pulse: 91 88  Resp: 15 19  Temp:    SpO2: 100% 100%    History of present illness:  Nicholas Weiss is a 69 y.o. male with medical history significant for uncontrolled diabetes mellitus type 2, hypertension, hyperlipidemia, CKD stage IIIb presents to the ED, complaining of nausea/vomiting, generalized abdominal pain, generalized weakness for the past 3 days.  Prior to the above complaints, patient had family over, noted to drink alcohol/beer and use cocaine.  Next day, patient started feeling poorly unable to keep anything down due to nausea and vomiting.  Stopped using his insulin and [name], as he was not feeling good.  Patient continued to feel poorly and decided to come into the ED.  Patient reports significant fatigue, still nauseous, but denies any vomiting, abdominal pain, shortness of breath, chest pain, fever/chills, dysuria.  Of note, patient is noncompliant with his medications as well as diet.  Patient provided history. In the ED, patient noted to be tachycardic, tachypneic, BP somewhat stable,  saturating well on room air.  Labs showed sodium 126, potassium 7.4, bicarb 11, glucose 1143, creatinine 5.6. (Baseline around 1.6-2), venous blood gas showed pH of 7.16, PCO2 32, bicarb 11.4, EKG with some peaked T wave changes, chest x-ray unremarkable, Covid 19 PCR neg. Patient was started on insulin drip, with IV fluid boluses, given calcium gluconate.  Hospitalist consulted for admission.    Today, patient reported feeling much better, very eager to be discharged.  CBGs are well under control.  Patient tolerated his breakfast well, denied any nausea/vomiting or abdominal pain.  Patient denied any chest pain, shortness of breath, fever/chills.  Patient was able to ambulate the hallway without any issues.  Patient advised to be compliant with his medications, avoid alcohol or illicit drug use.  Follow-up with PCP with repeat labs.   Hospital Course:  Principal Problem:   DKA (diabetic ketoacidoses) ([facility]) Active Problems:   Hypercholesterolemia   Essential (primary) hypertension   AKI (acute kidney injury) ([place])   CKD (chronic kidney disease) stage 3, GFR 30-59 ml/min   Type 2 diabetes mellitus with hyperglycemia ([facility])   DKA Uncontrolled diabetes mellitus type 2, with hyperglycemia/neuropathy Resolved status post insulin drip, and aggressive IV hydration Likely 2/2 noncompliance On admission, bicarb 11, glucose 1143, anion gap 29, venous blood gas showed pH of 7.16, bicarb 11 Beta hydroxybutyric acid elevated, but trended down to normal UA with some ketones, UDS positive for marijuana and cocaine Chest x-ray unremarkable Last A1c on 09/18/2019 was 9.7 Continue home insulin regimen, patient advised to be compliant with his insulin regimen as well as his diet Follow-up with PCP, endocrinologist  Hyponatremia Resolved  Hyperkalemia Resolved Potassium 7.4 on admission Likely 2/2 DKA EKG with some peaked T waves Status post calcium gluconate, improved with insulin drip,  hydration  AKI on CKD stage IIIa Creatinine 5.6 on admission, baseline around 1.6-2 S/p aggressive IV fluid, encouraged to stay hydrated Follow-up with PCP with repeat labs in 1 week  Hypertension Continue home meds  Hyperlipidemia Continue statins        Malnutrition Type:      Malnutrition Characteristics:      Nutrition Interventions:      Estimated body mass index is 24.11 kg/m as calculated from the following:   Height as of this encounter: 5\' 10"  (1.778 m).   Weight as of this encounter: 76.2 kg.    Procedures:  None  Consultations:  None  Discharge Exam: BP (!) 163/75   Pulse 88   Temp (!) 97.5 F (36.4 C) (Oral)   Resp 19   Ht 5\' 10"  (1.778 m)   Wt 76.2 kg   SpO2 100%   BMI 24.11 kg/m   General: NAD Cardiovascular: S1, S2 present Respiratory: CTA B Abdomen: Soft, nontender, nondistended, bowel sounds present  Discharge Instructions You were cared for by a hospitalist during your hospital stay. If you have any questions about your discharge medications or the care you received while you were in the hospital after you are discharged, you can call the unit and asked to speak with the hospitalist on call if the hospitalist that took care of you is not available. Once you are discharged, your primary care physician will handle any further medical issues. Please note that NO REFILLS for any discharge medications will be authorized once you are discharged, as it is imperative that you return to your primary care physician (or establish a relationship with a primary care physician if you do not have one) for your aftercare needs so that they can reassess your need for medications and monitor your lab values.  Discharge Instructions    Diet - low sodium heart healthy   Complete by: As directed    Increase activity slowly   Complete by: As directed      Allergies as of 11/07/2019      Reactions   Penicillins Other (See Comments)   Has patient  had a PCN reaction causing immediate rash, facial/tongue/throat swelling, SOB or lightheadedness with hypotension: No Has patient had a PCN reaction causing severe rash involving mucus membranes or skin necrosis: No Has patient had a PCN reaction that required hospitalization No Has patient had a PCN reaction occurring within the last 10 years: No If all of the above answers are "NO", then may proceed with Cephalosporin use.      Medication List    TAKE these medications   amLODipine 10 MG tablet Commonly known as: NORVASC Take 1 tablet (10 mg total) by mouth daily.   aspirin 81 MG chewable tablet Chew 1 tablet by mouth daily.   insulin aspart 100 UNIT/ML injection Commonly known as: novoLOG Inject 0-10 Units into the skin 4 (four) times daily -  before meals and at bedtime. If your sugar is under 100 before the meal and you eat a meal, take 2 units novolog after the meal If your sugar is 100-150, take 4 units before the meal If your sugar is 151-200, take 6 units before the meal If your sugar if 201-250 take 8 units before the meal If your sugar is 251-300 take 10 units before the meal If your sugar is  over 300, take 12 units before the meal.  If it is bedtime or any other time when you are not eating a meal and sugar is over 300, take 4 units If under 300 at bedtime, don't take any   lovastatin 40 MG tablet Commonly known as: MEVACOR Take 1 tablet (40 mg total) by mouth at bedtime.   pantoprazole 40 MG tablet Commonly known as: PROTONIX Take 40 mg by mouth daily.   [name] FlexTouch 100 UNIT/ML FlexTouch Pen Generic drug: insulin degludec Inject 0.24 mLs (24 Units total) into the skin daily.      Allergies  Allergen Reactions  . Penicillins Other (See Comments)    Has patient had a PCN reaction causing immediate rash, facial/tongue/throat swelling, SOB or lightheadedness with hypotension: No Has patient had a PCN reaction causing severe rash involving mucus membranes  or skin necrosis: No Has patient had a PCN reaction that required hospitalization No Has patient had a PCN reaction occurring within the last 10 years: No If all of the above answers are "NO", then may proceed with Cephalosporin use.     Follow-up Information    Birdie Sons, MD. Schedule an appointment as soon as possible for a visit in 1 week(s).   Specialty: Family Medicine Contact information: [address] [place] [address] [phone]                The results of significant diagnostics from this hospitalization (including imaging, microbiology, ancillary and laboratory) are listed below for reference.    Significant Diagnostic Studies: DG Chest Port 1 View  Result Date: 11/06/2019 CLINICAL DATA:  Generalized weakness. EXAM: PORTABLE CHEST 1 VIEW COMPARISON:  07/27/2019 FINDINGS: The heart size and mediastinal contours are within normal limits. Both lungs are clear. The visualized skeletal structures are unremarkable. Atherosclerotic changes are noted of the thoracic aorta. IMPRESSION: No active disease. Electronically Signed   By: Constance Holster M.D.   On: 11/06/2019 18:08    Microbiology: Recent Results (from the past 240 hour(s))  SARS Coronavirus 2 by RT PCR (hospital order, performed in [facility] hospital lab) Nasopharyngeal Nasopharyngeal Swab     Status: None   Collection Time: 11/06/19  4:52 PM   Specimen: Nasopharyngeal Swab  Result Value Ref Range Status   SARS Coronavirus 2 NEGATIVE NEGATIVE Final    Comment: (NOTE) SARS-CoV-2 target nucleic acids are NOT DETECTED.  The SARS-CoV-2 RNA is generally detectable in upper and lower respiratory specimens during the acute phase of infection. The lowest concentration of SARS-CoV-2 viral copies this assay can detect is 250 copies / mL. A negative result does not preclude SARS-CoV-2 infection and should not be used as the sole basis for treatment or other patient management decisions.   A negative result may occur with improper specimen collection / handling, submission of specimen other than nasopharyngeal swab, presence of viral mutation(s) within the areas targeted by this assay, and inadequate number of viral copies (<250 copies / mL). A negative result must be combined with clinical observations, patient history, and epidemiological information.  Fact Sheet for Patients:   StrictlyIdeas.no  Fact Sheet for Healthcare Providers: BankingDealers.co.za  This test is not yet approved or  cleared by the [place] FDA and has been authorized for detection and/or diagnosis of SARS-CoV-2 by FDA under an Emergency Use Authorization (EUA).  This EUA will remain in effect (meaning this test can be used) for the duration of the COVID-19 declaration under Section 564(b)(1) of the Act,  21 U.S.C. section 360bbb-3(b)(1), unless the authorization is terminated or revoked sooner.  Performed at [facility], [address] [place], [place] [address]      Labs: Basic Metabolic Panel: Recent Labs  Lab 11/06/19 2000 11/06/19 2243 11/07/19 0046 11/07/19 0427 11/07/19 0549  NA 128* 136 137 141 143  K 5.6* 4.2 4.1 4.0 4.2  CL 92* 100 104 107 107  CO2 13* 19* 22 23 24   GLUCOSE 1,108* 718* 496* 172* 148*  BUN 95* 89* 84* 81* 82*  CREATININE 5.80* 5.03* 4.85* 4.07* 3.97*  CALCIUM 7.9* 8.1* 8.2* 8.4* 8.9   Liver Function Tests: Recent Labs  Lab 11/06/19 2000  AST 16  ALT 15  ALKPHOS 54  BILITOT 1.5*  PROT 8.4*  ALBUMIN 3.6   Recent Labs  Lab 11/06/19 1451  LIPASE 20   No results for input(s): AMMONIA in the last 168 hours. CBC: Recent Labs  Lab 11/06/19 1652 11/07/19 0427  WBC 8.2 8.1  HGB 9.9* 10.4*  HCT 31.6* 30.7*  MCV 99.1 91.6  PLT 265 265   Cardiac Enzymes: No results for input(s): CKTOTAL, CKMB, CKMBINDEX, TROPONINI in the last 168 hours. BNP: BNP (last 3 results) No results for  input(s): BNP in the last 8760 hours.  ProBNP (last 3 results) No results for input(s): PROBNP in the last 8760 hours.  CBG: Recent Labs  Lab 11/07/19 0336 11/07/19 0453 11/07/19 0544 11/07/19 0712 11/07/19 0929  GLUCAP 203* 141* 130* 135* 148*       Signed:  Alma Friendly, MD [facility] 11/07/2019, 1:29 PM

## 2019-11-07 NOTE — ED Notes (Signed)
Patient and writer ambulated in the hallway, patient gait was steady, patient did not demonstrate any SHOB. Patient was able to talk with writer, NAD noted

## 2019-11-07 NOTE — TOC Initial Note (Signed)
Transition of Care [facility]) - Initial/Assessment Note    Patient Details  Name: Nicholas Weiss MRN: [id number] Date of Birth: Jul 26, 1950  Transition of Care [facility]) CM/SW Contact:    Anselm Pancoast, RN Phone Number: 11/07/2019, 1:20 PM  Clinical Narrative:                 Spoke with patient via telephone with nurse, Donneta Romberg present. Updated that patient had been changed to Observation. Patient verbalized understanding and had no other needs or concerns. States he has no dc needs and lives home with his family support.         Patient Goals and CMS Choice        Expected Discharge Plan and Services                                                Prior Living Arrangements/Services                       Activities of Daily Living      Permission Sought/Granted                  Emotional Assessment              Admission diagnosis:  DKA (diabetic ketoacidoses) (Agency) [E11.10] Patient Active Problem List   Diagnosis Date Noted  . Hyperglycemia due to type 2 diabetes mellitus ([place]) 07/28/2019  . Generalized weakness 07/28/2019  . Type 2 diabetes mellitus with hyperglycemia ([place]) 07/28/2019  . Hypoglycemia 04/27/2019  . Unresponsiveness 04/27/2019  . Lung nodule 04/07/2019  . Uncontrolled type 1 diabetes mellitus ([place])   . Acute renal failure with acute tubular necrosis superimposed on stage 3a chronic kidney disease ([place])   . Hypertensive urgency 04/05/2019  . AKI (acute kidney injury) ([place]) 04/05/2019  . CKD (chronic kidney disease) stage 3, GFR 30-59 ml/min 04/05/2019  . DKA (diabetic ketoacidoses) ([place]) 04/06/2016  . Hypercholesterolemia 01/24/2016  . Essential (primary) hypertension 06/07/2006  . Type 1 diabetes mellitus with diabetic neuropathy, unspecified ([place]) 05/31/2006   PCP:  Birdie Sons, MD Pharmacy:   [facility] [address], [place] - [place] [address] [place] [address] Phone: [phone]  Fax: [place] [address]), [place] ([place]) [place] [address] Phone: [phone] Fax: [phone]     Social Determinants of Health (SDOH) Interventions    Readmission Risk Interventions No flowsheet data found.

## 2019-11-07 NOTE — Care Management CC44 (Signed)
Condition Code 44 Documentation Completed  Patient Details  Name: Nicholas Weiss MRN: [id number] Date of Birth: 1950-09-24   Condition Code 44 given:  Yes Patient signature on Condition Code 44 notice:  Yes Documentation of 2 MD's agreement:  Yes Code 44 added to claim:  Yes    Anselm Pancoast, RN 11/07/2019, 1:14 PM

## 2019-11-07 NOTE — Progress Notes (Addendum)
Inpatient Diabetes Program Recommendations  AACE/ADA: New Consensus Statement on Inpatient Glycemic Control   Target Ranges:  Prepandial:   less than 140 mg/dL      Peak postprandial:   less than 180 mg/dL (1-2 hours)      Critically ill patients:  140 - 180 mg/dL   Results for Nicholas Weiss, Nicholas Weiss (MRN [id number]) as of 11/07/2019 09:30  Ref. Range 11/07/2019 00:04 11/07/2019 00:44 11/07/2019 01:37 11/07/2019 02:36 11/07/2019 03:36 11/07/2019 04:53 11/07/2019 05:44 11/07/2019 07:12 11/07/2019 09:29  Glucose-Capillary Latest Ref Range: 70 - 99 mg/dL 548 (HH) 459 (H) 387 (H) 308 (H) 203 (H) 141 (H) 130 (H) 135 (H) 148 (H)  Results for Nicholas Weiss, Nicholas Weiss (MRN [id number]) as of 11/07/2019 09:30  Ref. Range 11/06/2019 20:00  CO2 Latest Ref Range: 22 - 32 mmol/L 13 (L)  Glucose Latest Ref Range: 70 - 99 mg/dL 1,108 (HH)  Anion gap Latest Ref Range: 5 - 15  23 (H)  Results for Nicholas Weiss, Nicholas Weiss (MRN [id number]) as of 11/07/2019 09:30  Ref. Range 07/27/2019 22:32 09/18/2019 11:09  Hemoglobin A1C Latest Ref Range: 4.0 - 5.6 % 12.0 (H) 9.7 (A)   Review of Glycemic Control  Diabetes history: DM1 Outpatient Diabetes medications: Tresiba 24 units daily, Novolog 0-10 units QID Current orders for Inpatient glycemic control: Lantus 25 units Q24H, Novolog 0-9 units TID with meals, Novolog 0-5 units QHS, Novolog 3 units TID with meals for meal coverage   NOTE: In reviewing chart, noted patient seen PCP on 09/18/19 and per office note patient was having frequent hypoglycemia and was asked to contact Dr. Honor Junes (Endocrinologist) for advice on adjustments with insulin. Last office visit in chart with Dr. Honor Junes was on 05/05/19.  Patient was last inpatient 07/28/19-07/29/19 and was seen by inpatient diabetes coordinator on 07/28/19. Per H&P on 11/06/19, "complaining of nausea/vomiting, generalized abdominal pain, generalized weakness for the past 3 days.  Prior to the above complaints, patient had family over, noted to drink alcohol/beer  and use cocaine.  Next day, patient started feeling poorly unable to keep anything down due to nausea and vomiting.  Stopped using his insulin and [name], as he was not feeling good.  Patient continued to feel poorly and decided to come into the ED." Initial glucose 1108 on 11/06/19 and patient was started on IV insulin for DKA. Patient has been transitioned from IV to SQ insulin and most current glucose 135 mg/dl.  Agree with current orders at this time.  Addendum 11/07/19@14 :00- Spoke with patient regarding DM, DKA, and importance of taking DM medications. Patient states that he had been consistently taking [name] 24 units daily and Novolog QID per correction scale but he admits that this past weekend he did not take the [name] on time and his sugar went up and he was having nausea, vomiting, and not feeling well. Patient states that his sister recently passed away 2 weeks ago and he was spending time with his brother this past weekend and they were drinking and smoked marijuana. Patient states he stayed up late and he was not awake on time to take his [name] the following morning. He notes when he did wake up he was already feeling really bad and was sick so he did not take his insulin. Discussed importance of taking insulin consistently. Explained that he has to take insulin consistently since he has DM1 and explained that he will go into DKA if he skips Tresiba insulin dosages.  Patient states that he understands that and he  knows better. Inquired about how glucose is trending when he does take insulin as prescribed and patient states that his glucose is trending much better lately and that he is not having hypoglycemia very often anymore. Patient states that he is using Novolog for correction per scale he was given by Dr. Honor Junes.  Patient reports that he is finding that his glucose is going up after eating.  Encouraged patient to talk with Dr. Honor Junes regarding glucose trends to see if he needs insulin  adjustments. Patient notes that he is in the process of trying to get the FreeStyle Libre CGM. Discussed how the FreeStyle Elenor Legato could provide more data for Dr. Honor Junes to use to continue to adjust insulin dosages if needed.  Asked patient to call Dr. Sherren Mocha office and ask about setting up a follow up appointment. Patient states he has everything he needs at home for DM management. Patient verbalized understanding of information discussed and is waiting to get discharge papers so he can be discharged from the Emergency Room today.   Thanks, Barnie Alderman, RN, MSN, CDE Diabetes Coordinator Inpatient Diabetes Program [phone] (Team Pager from 8am to 5pm)

## 2019-11-07 NOTE — Care Management Obs Status (Signed)
[place] NOTIFICATION   Patient Details  Name: Nicholas Weiss MRN: [id number] Date of Birth: [birth year]   Medicare Observation Status Notification Given:  Yes    Anselm Pancoast, RN 11/07/2019, 1:14 PM

## 2019-11-07 NOTE — ED Notes (Signed)
Patient eating breakfast. °

## 2019-11-07 NOTE — ED Notes (Signed)
Once cbg less than 246mL, message [place], NP  DO not start d5 .45%

## 2019-11-08 ENCOUNTER — Emergency Department
Admission: EM | Admit: 2019-11-08 | Discharge: 2019-11-08 | Discharge status: Against Medical Advice | Payer: Medicare HMO

## 2019-11-08 DIAGNOSIS — I959 Hypotension, unspecified: Secondary | ICD-10-CM | POA: Diagnosis not present

## 2019-11-08 DIAGNOSIS — R531 Weakness: Secondary | ICD-10-CM | POA: Diagnosis not present

## 2019-11-16 ENCOUNTER — Ambulatory Visit: Payer: Self-pay

## 2019-11-21 NOTE — Chronic Care Management (AMB) (Signed)
  Chronic Care Management   Outreach Note   Name: Nicholas Weiss MRN: [id number] DOB: December 29, 1950  Primary Care Provider: Birdie Sons, MD Reason for referral : Chronic Care Management    Third unsuccessful telephone outreach was attempted today. Nicholas Weiss was referred to the care management team for assistance with chronic care management and care coordination. His primary care provider will be notified of our unsuccessful attempts to maintain contact. The care management team will gladly outreach at any time in the future if he interested in receiving assistance.   PLAN The care management team will gladly follow up with Nicholas Weiss after the primary care provider has a conversation with him regarding recommendation for care management engagement and subsequent re-referral for care management services.    Cristy [facility] Care Management [facility] [phone]

## 2019-12-25 ENCOUNTER — Ambulatory Visit (INDEPENDENT_AMBULATORY_CARE_PROVIDER_SITE_OTHER): Payer: Medicare HMO | Admitting: Family Medicine

## 2019-12-25 ENCOUNTER — Encounter: Payer: Self-pay | Admitting: Family Medicine

## 2019-12-25 ENCOUNTER — Other Ambulatory Visit: Payer: Self-pay

## 2019-12-25 VITALS — BP 162/92 | HR 105 | Temp 98.3°F | Wt 169.6 lb

## 2019-12-25 DIAGNOSIS — E1165 Type 2 diabetes mellitus with hyperglycemia: Secondary | ICD-10-CM | POA: Diagnosis not present

## 2019-12-25 DIAGNOSIS — R7309 Other abnormal glucose: Secondary | ICD-10-CM | POA: Diagnosis not present

## 2019-12-25 DIAGNOSIS — Z23 Encounter for immunization: Secondary | ICD-10-CM | POA: Diagnosis not present

## 2019-12-25 DIAGNOSIS — I10 Essential (primary) hypertension: Secondary | ICD-10-CM

## 2019-12-25 LAB — POCT GLYCOSYLATED HEMOGLOBIN (HGB A1C)
Estimated Average Glucose: 226
Hemoglobin A1C: 9.5 % — AB (ref 4.0–5.6)

## 2019-12-25 MED ORDER — TRESIBA FLEXTOUCH 100 UNIT/ML ~~LOC~~ SOPN: 30.0000 [IU] | PEN_INJECTOR | Freq: Every day | SUBCUTANEOUS | Status: DC

## 2019-12-25 MED ORDER — INSULIN ASPART 100 UNIT/ML ~~LOC~~ SOLN: 0.0000 [IU] | Freq: Three times a day (TID) | SUBCUTANEOUS | 5 refills | Status: DC

## 2019-12-25 MED ORDER — PREGABALIN 75 MG PO CAPS: 75.0000 mg | ORAL_CAPSULE | Freq: Two times a day (BID) | ORAL | 3 refills | Status: DC

## 2019-12-25 NOTE — Patient Instructions (Signed)
.   Please review the attached list of medications and notify my office if there are any errors.    Please bring all of your medications to every appointment so we can make sure that our medication list is the same as yours.   Increase Tresiba to 30 units every day   Check your sugar before each meal If your sugar is 100-150, take 4 units before the meal If your sugar is 151-200, take 6 units before the meal If your sugar if 201-250 take 8 units before the meal If your sugar is 251-300 take 10 units before the meal If your sugar is over 300, take 12 units before the meal.  If it is bedtime or any other time when you are not eating a meal and sugar is over 300, take 4 units If under 300 at bedtime, don't take any

## 2019-12-25 NOTE — Progress Notes (Signed)
Established patient visit   Patient: Nicholas Weiss   DOB: Dec 08, 1950   69 y.o. Male  MRN: [id number] Visit Date: 12/25/2019  Today's healthcare provider: Lelon Huh, MD   Chief Complaint  Patient presents with  . Diabetes  . Hypertension   Subjective    HPI  Diabetes Mellitus Type II, Follow-up  Lab Results  Component Value Date   HGBA1C 9.7 (A) 09/18/2019   HGBA1C 12.0 (H) 07/27/2019   HGBA1C 12.0 (H) 04/05/2019   Wt Readings from Last 3 Encounters:  12/25/19 169 lb 9.6 oz (76.9 kg)  11/06/19 168 lb (76.2 kg)  09/18/19 170 lb (77.1 kg)   Last seen for diabetes 3 months ago. Marland Kitchen He reports excellent compliance with treatment. He is not having side effects.    Home blood sugar records: Fasting 150s to low 200s.   Episodes of hypoglycemia? No    Current insulin regiment: tresiba 0.24 ML daily He has been much more consistent in taking Levemir every day.  Current exercise: walking Current diet habits: on average, 2 meals per day  Pertinent Labs: Lab Results  Component Value Date   CHOL 138 12/19/2018   HDL 46 12/19/2018   LDLCALC 78 12/19/2018   TRIG 70 12/19/2018   CHOLHDL 3.0 12/19/2018   Lab Results  Component Value Date   NA 143 11/07/2019   K 4.2 11/07/2019   CREATININE 3.97 (H) 11/07/2019   GFRNONAA 15 (L) 11/07/2019   GFRAA 17 (L) 11/07/2019   GLUCOSE 148 (H) 11/07/2019     --------------------------------------------------------------------------------------------------- Hypertension, follow-up  BP Readings from Last 3 Encounters:  12/25/19 (!) 162/92  11/07/19 136/88  09/18/19 (!) 142/78   Wt Readings from Last 3 Encounters:  12/25/19 169 lb 9.6 oz (76.9 kg)  11/06/19 168 lb (76.2 kg)  09/18/19 170 lb (77.1 kg)     He was last seen for hypertension 3 months ago.  BP at that visit was 142/78. Management since that visit includes no changes.  He reports excellent compliance with treatment. He is not having side effects.  He  is following a Regular diet. He is exercising. He does not smoke.-  Use of agents associated with hypertension: none.  .  Pertinent labs: Lab Results  Component Value Date   CHOL 138 12/19/2018   HDL 46 12/19/2018   LDLCALC 78 12/19/2018   TRIG 70 12/19/2018   CHOLHDL 3.0 12/19/2018   Lab Results  Component Value Date   NA 143 11/07/2019   K 4.2 11/07/2019   CREATININE 3.97 (H) 11/07/2019   GFRNONAA 15 (L) 11/07/2019   GFRAA 17 (L) 11/07/2019   GLUCOSE 148 (H) 11/07/2019     The 10-year ASCVD risk score Mikey Bussing DC Jr., et al., 2013) is: 39.3%   ---------------------------------------------------------------------------------------------------    Medications: Outpatient Medications Prior to Visit  Medication Sig  . amLODipine (NORVASC) 10 MG tablet Take 1 tablet (10 mg total) by mouth daily.  Marland Kitchen aspirin 81 MG chewable tablet Chew 1 tablet by mouth daily.  . insulin aspart (NOVOLOG) 100 UNIT/ML injection Inject 0-10 Units into the skin 4 (four) times daily -  before meals and at bedtime. If your sugar is under 100 before the meal and you eat a meal, take 2 units novolog after the meal If your sugar is 100-150, take 4 units before the meal If your sugar is 151-200, take 6 units before the meal If your sugar if 201-250 take 8 units before the meal If your  sugar is 251-300 take 10 units before the meal If your sugar is over 300, take 12 units before the meal.  If it is bedtime or any other time when you are not eating a meal and sugar is over 300, take 4 units If under 300 at bedtime, don't take any   . insulin degludec (TRESIBA FLEXTOUCH) 100 UNIT/ML FlexTouch Pen Inject 0.24 mLs (24 Units total) into the skin daily.  Marland Kitchen lovastatin (MEVACOR) 40 MG tablet Take 1 tablet (40 mg total) by mouth at bedtime.  . pantoprazole (PROTONIX) 40 MG tablet Take 40 mg by mouth daily.    No facility-administered medications prior to visit.    Review of Systems  Constitutional: Negative  for appetite change, chills and fever.  Respiratory: Negative for chest tightness, shortness of breath and wheezing.   Cardiovascular: Negative for chest pain and palpitations.  Gastrointestinal: Negative for abdominal pain, nausea and vomiting.    Objective    BP (!) 162/92 (BP Location: Left Arm, Patient Position: Sitting, Cuff Size: Normal)   Pulse (!) 105   Temp 98.3 F (36.8 C) (Oral)   Wt 169 lb 9.6 oz (76.9 kg)   BMI 24.34 kg/m    Physical Exam   General: Appearance:    Well developed, well nourished male in no acute distress  Eyes:    PERRL, conjunctiva/corneas clear, EOM's intact       Lungs:     Clear to auscultation bilaterally, respirations unlabored  Heart:    Tachycardic. Normal rhythm. No murmurs, rubs, or gallops.   MS:   All extremities are intact.   Neurologic:   Awake, alert, oriented x 3. No apparent focal neurological           defect.        Results for orders placed or performed in visit on 12/25/19  POCT HgB A1C  Result Value Ref Range   Hemoglobin A1C 9.5 (A) 4.0 - 5.6 %   Estimated Average Glucose 226     Assessment & Plan     1. Uncontrolled type 2 diabetes mellitus with hyperglycemia ([place]) Better since taking Levemir consistently every day. [place] not to goal. Will increase from 24 units to 30 units levemir daily and continue current SS novolog.   2. Essential (primary) hypertension Well controlled.  Continue current medications.    3. Blood glucose labile Better now that he is taking basal insulin consistently every day.   4. Need for immunization against influenza  - Flu Vaccine QUAD High Dose(Fluad)  5. Diabetic neuropathy He states gabapentin is not helping at all. He would like to see podiatrist.  - pregabalin (LYRICA) 75 MG capsule; Take 1 capsule (75 mg total) by mouth 2 (two) times daily.  Dispense: 60 capsule; Refill: 3   Follow up 2 months.        The entirety of the information documented in the History of Present  Illness, Review of Systems and Physical Exam were personally obtained by me. Portions of this information were initially documented by the CMA and reviewed by me for thoroughness and accuracy.      Lelon Huh, MD  [facility] [phone] (phone) [phone] (fax)  [place]

## 2019-12-26 ENCOUNTER — Telehealth: Payer: Self-pay

## 2019-12-26 MED ORDER — INSULIN ASPART 100 UNIT/ML ~~LOC~~ SOLN: SUBCUTANEOUS | 5 refills | Status: DC

## 2019-12-26 NOTE — Telephone Encounter (Signed)
Spoke to pharmacy about the clarification for the prescription because she was wondering which instructions to use because the prescription says "Sig: Inject 0-10 Units into the skin 4 (four) times daily - before meals and at bedtime. Inject up to 14 units three times daily before mails according to sliding scale" please advise?

## 2019-12-26 NOTE — Telephone Encounter (Signed)
4x daily.

## 2019-12-26 NOTE — Telephone Encounter (Signed)
Copied from [place] [id number]. Topic: General - Other >> Dec 26, 2019  1:00 PM Rainey Pines A wrote: [facility] would like clarification on novolog instructions for patient. Best contact [phone]

## 2019-12-26 NOTE — Telephone Encounter (Signed)
Please review. Novlog sig has 3x daily and 4x daily. Which do you prefer? Thanks!

## 2019-12-27 NOTE — Telephone Encounter (Signed)
Pharmacist advised as below and verbalized understanding.

## 2019-12-27 NOTE — Telephone Encounter (Signed)
I called and spoke with pharmacist and advised her as below. Pharmacist says she is confused because the prescription that they received (which is now ready for patient to pick up) says to inject up to 14 units into the skin three times daily according to sliding scale. Pharmacist wants clarification on the directions. She says she also needs to know the maximin amount of units per day. Please clarify.

## 2019-12-27 NOTE — Telephone Encounter (Signed)
Up to 14 units 3 times a day before meals. Up to 42 units in a day.

## 2019-12-28 NOTE — Progress Notes (Addendum)
Subjective:   Nicholas Weiss is a 69 y.o. male who presents for an Initial Medicare Annual Wellness Visit.  Review of Systems    N/A  Cardiac Risk Factors include: advanced age (>71men, >40 women);diabetes mellitus;dyslipidemia;male gender;hypertension     Objective:    There were no vitals filed for this visit. There is no height or weight on file to calculate BMI.  Advanced Directives 01/01/2020 11/06/2019 07/28/2019 04/27/2019 04/27/2019 04/25/2019 04/22/2019  Does Patient Have a Medical Advance Directive? No No No No No No No  Would patient like information on creating a medical advance directive? No - Patient declined No - Patient declined No - Patient declined No - Patient declined No - Guardian declined - No - Patient declined    Current Medications (verified) Outpatient Encounter Medications as of 01/01/2020  Medication Sig  . amLODipine (NORVASC) 10 MG tablet Take 1 tablet (10 mg total) by mouth daily.  Marland Kitchen aspirin 81 MG chewable tablet Chew 1 tablet by mouth daily.  . insulin aspart (NOVOLOG) 100 UNIT/ML injection Inject up to 14 units three times daily before meals according to sliding scale  . insulin degludec (TRESIBA FLEXTOUCH) 100 UNIT/ML FlexTouch Pen Inject 30 Units into the skin daily.  Marland Kitchen lovastatin (MEVACOR) 40 MG tablet Take 1 tablet (40 mg total) by mouth at bedtime.  . pantoprazole (PROTONIX) 40 MG tablet Take 40 mg by mouth daily.   . pregabalin (LYRICA) 75 MG capsule Take 1 capsule (75 mg total) by mouth 2 (two) times daily.   No facility-administered encounter medications on file as of 01/01/2020.    Allergies (verified) Penicillins   History: Past Medical History:  Diagnosis Date  . DKA (diabetic ketoacidoses) 04/06/2016  . Hypercholesteremia   . Hypertension    Past Surgical History:  Procedure Laterality Date  . KNEE SURGERY Right    Torn meniscus  . KNEE SURGERY Left    Family History  Problem Relation Age of Onset  . Heart attack Father   .  Hypertension Sister   . Cancer Sister    Social History   Socioeconomic History  . Marital status: Significant Other    Spouse name: Not on file  . Number of children: 3  . Years of education: Not on file  . 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 Smoking status: Never Smoker  . Smokeless tobacco: Never Used  Vaping Use  . Vaping Use: Never used  Substance and Sexual Activity  . Alcohol use: Yes    Alcohol/week: 0.0 - 1.0 standard drinks  . Drug use: Yes    Types: Marijuana  . Sexual activity: Yes    Birth control/protection: None  Other Topics Concern  . Not on file  Social History Narrative  . Not on file   Social Determinants of Health   Financial Resource Strain: Low Risk   . Difficulty of Paying Living Expenses: Not hard at all  Food Insecurity: No Food Insecurity  . Worried About Charity fundraiser in the Last Year: Never true  . Ran Out of Food in the Last Year: Never true  Transportation Needs: No Transportation Needs  . Lack of Transportation (Medical): No  . Lack of Transportation (Non-Medical): No  Physical Activity: Insufficiently Active  . Days of Exercise per Week: 7 days  . Minutes of Exercise per Session: 20 min  Stress: No Stress Concern Present  . Feeling  of Stress : Not at all  Social Connections: Moderately Isolated  . Frequency of Communication with Friends and Family: More than three times a week  . Frequency of Social Gatherings with Friends and Family: More than three times a week  . Attends Religious Services: Never  . Active Member of Clubs or Organizations: No  . Attends Archivist Meetings: Never  . Marital Status: Living with partner    Tobacco Counseling Counseling given: Not Answered   Clinical Intake:  Pre-visit preparation completed: Yes  Pain : No/denies pain     Nutritional Risks: None Diabetes: Yes  How often do you need to  have someone help you when you read instructions, pamphlets, or other written materials from your doctor or pharmacy?: 1 - Never  Diabetic? Yes  Nutrition Risk Assessment:  Has the patient had any N/V/D within the last 2 months?  No  Does the patient have any non-healing wounds?  No  Has the patient had any unintentional weight loss or weight gain?  No   Diabetes:  Is the patient diabetic? Yes If diabetic, was a CBG obtained today?  No  Did the patient bring in their glucometer from home?  No  How often do you monitor your CBG's? Three to four times daily. .   Financial Strains and Diabetes Management:  Are you having any financial strains with the device, your supplies or your medication? No .  Does the patient want to be seen by Chronic Care Management for management of their diabetes?  No  Would the patient like to be referred to a Nutritionist or for Diabetic Management? Yes, referral placed today for a nutritionist.   Diabetic Exams:  Diabetic Eye Exam: Completed 05/23/19 Diabetic Foot Exam: Overdue, Pt has been advised about the importance in completing this exam. Note made to follow up on this next in office apt.    Interpreter Needed?: No  Information entered by :: [facility], LPN   Activities of Daily Living In your present state of health, do you have any difficulty performing the following activities: 01/01/2020 12/25/2019  Hearing? N N  Vision? Y Y  Comment Needs a new eye glass prescription. -  Difficulty concentrating or making decisions? N N  Comment - -  Walking or climbing stairs? N N  Dressing or bathing? N N  Doing errands, shopping? N N  Preparing Food and eating ? N -  Using the Toilet? N -  In the past six months, have you accidently leaked urine? N -  Do you have problems with loss of bowel control? N -  Managing your Medications? N -  Managing your Finances? N -  Housekeeping or managing your Housekeeping? N -  Some recent data might be hidden     Patient Care Team: Birdie Sons, MD as PCP - General (Family Medicine) Neldon Labella, RN as Case Manager Pa, [place] (Optometry)  Indicate any recent Medical Services you may have received from other than [facility] providers in the past year (date may be approximate).     Assessment:   This is a routine wellness examination for [facility].  Hearing/Vision screen No exam data present  Dietary issues and exercise activities discussed: Current Exercise Habits: Home exercise routine, Type of exercise: walking, Time (Minutes): 20, Frequency (Times/Week): 7, Weekly Exercise (Minutes/Week): 140, Intensity: Mild, Exercise limited by: None identified  Goals    . DIET - INCREASE WATER INTAKE     Recommend to drink at least  6-8 8oz glasses of water per day.      Depression Screen PHQ 2/9 Scores 12/25/2019 05/10/2019 04/26/2018 04/22/2017 01/24/2016  PHQ - 2 Score 2 0 0 0 6  PHQ- 9 Score 4 - 2 2 18     Fall Risk Fall Risk  01/01/2020 12/25/2019 05/10/2019 04/26/2018  Falls in the past year? 1 0 1 0  Number falls in past yr: 0 0 1 -  Injury with Fall? 0 0 0 -  Risk for fall due to : Other (Comment) - - -  Risk for fall due to: Comment Due to blood sugar drop. - - -  Follow up Falls prevention discussed Falls evaluation completed - -    Any stairs in or around the home? Yes  If so, are there any without handrails? No  Home free of loose throw rugs in walkways, pet beds, electrical cords, etc? Yes  Adequate lighting in your home to reduce risk of falls? Yes   ASSISTIVE DEVICES UTILIZED TO PREVENT FALLS:  Life alert? No  Use of a cane, walker or w/c? No  Grab bars in the bathroom? Yes  Shower chair or bench in shower? Yes  Elevated toilet seat or a handicapped toilet? Yes    Cognitive Function: Declined today.         Immunizations Immunization History  Administered Date(s) Administered  . Fluad Quad(high Dose 65+) 12/19/2018, 12/25/2019  . Influenza,inj,Quad PF,6+  Mos 04/08/2016, 04/22/2017, 01/03/2018  . Pneumococcal Conjugate-13 08/14/2019  . Pneumococcal Polysaccharide-23 04/08/2016  . Td 03/30/2016    TDAP status: Up to date Flu Vaccine status: Up to date Pneumococcal vaccine status: Up to date Covid-19 vaccine status: Completed vaccines  Qualifies for Shingles Vaccine? Yes   Zostavax completed No   Shingrix Completed?: No.    Education has been provided regarding the importance of this vaccine. Patient has been advised to call insurance company to determine out of pocket expense if they have not yet received this vaccine. Advised may also receive vaccine at local pharmacy or Health Dept. Verbalized acceptance and understanding.  Screening Tests Health Maintenance  Topic Date Due  . Hepatitis C Screening  Never done  . FOOT EXAM  Never done  . COVID-19 Vaccine (1) Never done  . COLONOSCOPY  Never done  . COLON CANCER SCREENING ANNUAL FOBT  04/23/2018  . OPHTHALMOLOGY EXAM  05/22/2020  . HEMOGLOBIN A1C  06/23/2020  . URINE MICROALBUMIN  09/17/2020  . PNA vac Low Risk Adult (2 of 2 - PPSV23) 04/08/2021  . TETANUS/TDAP  03/30/2026  . INFLUENZA VACCINE  Completed    Health Maintenance  Health Maintenance Due  Topic Date Due  . Hepatitis C Screening  Never done  . FOOT EXAM  Never done  . COVID-19 Vaccine (1) Never done  . COLONOSCOPY  Never done  . COLON CANCER SCREENING ANNUAL FOBT  04/23/2018    Colorectal cancer screening: Referral to GI placed today. Pt aware the office will call re: appt.  Lung Cancer Screening: (Low Dose CT Chest recommended if Age 30-80 years, 30 pack-year currently smoking OR have quit w/in 15years.) does not qualify.   Additional Screening:   Hepatitis C Screening: does qualify and would like this added to next blood work orders in office.  Vision Screening: Recommended annual ophthalmology exams for early detection of glaucoma and other disorders of the eye. Is the patient up to date with their  annual eye exam?  Yes  Who is the provider or what is  the name of the office in which the patient attends annual eye exams? Pt to schedule an apt @ [place] this fall.  If pt is not established with a provider, would they like to be referred to a provider to establish care? No .   Dental Screening: Recommended annual dental exams for proper oral hygiene  Community Resource Referral / Chronic Care Management: CRR required this visit?  No   CCM required this visit?  No      Plan:     I have personally reviewed and noted the following in the patient's chart:   . Medical and social history . Use of alcohol, tobacco or illicit drugs  . Current medications and supplements . Functional ability and status . Nutritional status . Physical activity . Advanced directives . List of other physicians . Hospitalizations, surgeries, and ER visits in previous 12 months . Vitals . Screenings to include cognitive, depression, and falls . Referrals and appointments  In addition, I have reviewed and discussed with patient certain preventive protocols, quality metrics, and best practice recommendations. A written personalized care plan for preventive services as well as general preventive health recommendations were provided to patient.     Quayshaun Hubbert [place], [place]   [id number]/05/[id number]   Nurse Notes: Pt needs a diabetic foot exam at next in office apt. Requested Covid vaccine card information to up date chart.

## 2020-01-01 ENCOUNTER — Ambulatory Visit (INDEPENDENT_AMBULATORY_CARE_PROVIDER_SITE_OTHER): Payer: Medicare HMO

## 2020-01-01 ENCOUNTER — Other Ambulatory Visit: Payer: Self-pay

## 2020-01-01 DIAGNOSIS — N1831 Chronic kidney disease, stage 3a: Secondary | ICD-10-CM | POA: Diagnosis not present

## 2020-01-01 DIAGNOSIS — Z0000 Encounter for general adult medical examination without abnormal findings: Secondary | ICD-10-CM

## 2020-01-01 DIAGNOSIS — E1040 Type 1 diabetes mellitus with diabetic neuropathy, unspecified: Secondary | ICD-10-CM | POA: Diagnosis not present

## 2020-01-01 DIAGNOSIS — Z1211 Encounter for screening for malignant neoplasm of colon: Secondary | ICD-10-CM | POA: Diagnosis not present

## 2020-01-01 DIAGNOSIS — E1165 Type 2 diabetes mellitus with hyperglycemia: Secondary | ICD-10-CM | POA: Diagnosis not present

## 2020-01-01 DIAGNOSIS — Z794 Long term (current) use of insulin: Secondary | ICD-10-CM | POA: Diagnosis not present

## 2020-01-01 NOTE — Patient Instructions (Signed)
[name] , Thank you for taking time to come for your Medicare Wellness Visit. I appreciate your ongoing commitment to your health goals. Please review the following plan we discussed and let me know if I can assist you in the future.   Screening recommendations/referrals: Colonoscopy: Referral placed today. Pt aware office will contact him re:apt.  Recommended yearly ophthalmology/optometry visit for glaucoma screening and checkup Recommended yearly dental visit for hygiene and checkup  Vaccinations: Influenza vaccine: Done 12/25/19. Pneumococcal vaccine: Completed series Tdap vaccine: Up to date, due 03/2026 Shingles vaccine: Shingrix discussed. Please contact your pharmacy for coverage information.     Advanced directives: Advance directive discussed with you today. Even though you declined this today please call our office should you change your mind and we can give you the proper paperwork for you to fill out.  Conditions/risks identified: Recommend to drink at least 6-8 8oz glasses of water per day.  Next appointment: 01/03/20 @ 11:00 AM with Dr Caryn Section   Preventive Care 16 Years and Older, Male Preventive care refers to lifestyle choices and visits with your health care provider that can promote health and wellness. What does preventive care include?  A yearly physical exam. This is also called an annual well check.  Dental exams once or twice a year.  Routine eye exams. Ask your health care provider how often you should have your eyes checked.  Personal lifestyle choices, including:  Daily care of your teeth and gums.  Regular physical activity.  Eating a healthy diet.  Avoiding tobacco and drug use.  Limiting alcohol use.  Practicing safe sex.  Taking low doses of aspirin every day.  Taking vitamin and mineral supplements as recommended by your health care provider. What happens during an annual well check? The services and screenings done by your health care  provider during your annual well check will depend on your age, overall health, lifestyle risk factors, and family history of disease. Counseling  Your health care provider may ask you questions about your:  Alcohol use.  Tobacco use.  Drug use.  Emotional well-being.  Home and relationship well-being.  Sexual activity.  Eating habits.  History of falls.  Memory and ability to understand (cognition).  Work and work Statistician. Screening  You may have the following tests or measurements:  Height, weight, and BMI.  Blood pressure.  Lipid and cholesterol levels. These may be checked every 5 years, or more frequently if you are over 73 years old.  Skin check.  Lung cancer screening. You may have this screening every year starting at age 93 if you have a 30-pack-year history of smoking and currently smoke or have quit within the past 15 years.  Fecal occult blood test (FOBT) of the stool. You may have this test every year starting at age 38.  Flexible sigmoidoscopy or colonoscopy. You may have a sigmoidoscopy every 5 years or a colonoscopy every 10 years starting at age 14.  Prostate cancer screening. Recommendations will vary depending on your family history and other risks.  Hepatitis C blood test.  Hepatitis B blood test.  Sexually transmitted disease (STD) testing.  Diabetes screening. This is done by checking your blood sugar (glucose) after you have not eaten for a while (fasting). You may have this done every 1-3 years.  Abdominal aortic aneurysm (AAA) screening. You may need this if you are a current or former smoker.  Osteoporosis. You may be screened starting at age 57 if you are at high risk.  Talk with your health care provider about your test results, treatment options, and if necessary, the need for more tests. Vaccines  Your health care provider may recommend certain vaccines, such as:  Influenza vaccine. This is recommended every year.  Tetanus,  diphtheria, and acellular pertussis (Tdap, Td) vaccine. You may need a Td booster every 10 years.  Zoster vaccine. You may need this after age 66.  Pneumococcal 13-valent conjugate (PCV13) vaccine. One dose is recommended after age 35.  Pneumococcal polysaccharide (PPSV23) vaccine. One dose is recommended after age 7. Talk to your health care provider about which screenings and vaccines you need and how often you need them. This information is not intended to replace advice given to you by your health care provider. Make sure you discuss any questions you have with your health care provider. Document Released: 04/12/2015 Document Revised: 12/04/2015 Document Reviewed: 01/15/2015 Elsevier Interactive Patient Education  2017 [place] Prevention in the Home Falls can cause injuries. They can happen to people of all ages. There are many things you can do to make your home safe and to help prevent falls. What can I do on the outside of my home?  Regularly fix the edges of walkways and driveways and fix any cracks.  Remove anything that might make you trip as you walk through a door, such as a raised step or threshold.  Trim any bushes or trees on the path to your home.  Use bright outdoor lighting.  Clear any walking paths of anything that might make someone trip, such as rocks or tools.  Regularly check to see if handrails are loose or broken. Make sure that both sides of any steps have handrails.  Any raised decks and porches should have guardrails on the edges.  Have any leaves, snow, or ice cleared regularly.  Use sand or salt on walking paths during winter.  Clean up any spills in your garage right away. This includes oil or grease spills. What can I do in the bathroom?  Use night lights.  Install grab bars by the toilet and in the tub and shower. Do not use towel bars as grab bars.  Use non-skid mats or decals in the tub or shower.  If you need to sit down in  the shower, use a plastic, non-slip stool.  Keep the floor dry. Clean up any water that spills on the floor as soon as it happens.  Remove soap buildup in the tub or shower regularly.  Attach bath mats securely with double-sided non-slip rug tape.  Do not have throw rugs and other things on the floor that can make you trip. What can I do in the bedroom?  Use night lights.  Make sure that you have a light by your bed that is easy to reach.  Do not use any sheets or blankets that are too big for your bed. They should not hang down onto the floor.  Have a firm chair that has side arms. You can use this for support while you get dressed.  Do not have throw rugs and other things on the floor that can make you trip. What can I do in the kitchen?  Clean up any spills right away.  Avoid walking on wet floors.  Keep items that you use a lot in easy-to-reach places.  If you need to reach something above you, use a strong step stool that has a grab bar.  Keep electrical cords out of the way.  Do not use floor polish or wax that makes floors slippery. If you must use wax, use non-skid floor wax.  Do not have throw rugs and other things on the floor that can make you trip. What can I do with my stairs?  Do not leave any items on the stairs.  Make sure that there are handrails on both sides of the stairs and use them. Fix handrails that are broken or loose. Make sure that handrails are as long as the stairways.  Check any carpeting to make sure that it is firmly attached to the stairs. Fix any carpet that is loose or worn.  Avoid having throw rugs at the top or bottom of the stairs. If you do have throw rugs, attach them to the floor with carpet tape.  Make sure that you have a light switch at the top of the stairs and the bottom of the stairs. If you do not have them, ask someone to add them for you. What else can I do to help prevent falls?  Wear shoes that:  Do not have high  heels.  Have rubber bottoms.  Are comfortable and fit you well.  Are closed at the toe. Do not wear sandals.  If you use a stepladder:  Make sure that it is fully opened. Do not climb a closed stepladder.  Make sure that both sides of the stepladder are locked into place.  Ask someone to hold it for you, if possible.  Clearly mark and make sure that you can see:  Any grab bars or handrails.  First and last steps.  Where the edge of each step is.  Use tools that help you move around (mobility aids) if they are needed. These include:  Canes.  Walkers.  Scooters.  Crutches.  Turn on the lights when you go into a dark area. Replace any light bulbs as soon as they burn out.  Set up your furniture so you have a clear path. Avoid moving your furniture around.  If any of your floors are uneven, fix them.  If there are any pets around you, be aware of where they are.  Review your medicines with your doctor. Some medicines can make you feel dizzy. This can increase your chance of falling. Ask your doctor what other things that you can do to help prevent falls. This information is not intended to replace advice given to you by your health care provider. Make sure you discuss any questions you have with your health care provider. Document Released: 01/10/2009 Document Revised: 08/22/2015 Document Reviewed: 04/20/2014 Elsevier Interactive Patient Education  2017 [employer].

## 2020-01-03 ENCOUNTER — Ambulatory Visit: Payer: Self-pay | Admitting: Family Medicine

## 2020-01-09 ENCOUNTER — Telehealth: Payer: Self-pay

## 2020-01-09 NOTE — Telephone Encounter (Signed)
Copied from [place] [id number]. Topic: General - Other >> Jan 09, 2020 11:49 AM Leward Quan A wrote: Reason for CRM: Patient called to inform Dr Caryn Section that he have not received the pregabalin (LYRICA) 75 MG capsule was informed by the pharmacy that he need Prior authorization to get that medication. Ph# [phone]

## 2020-01-18 ENCOUNTER — Other Ambulatory Visit: Payer: Self-pay

## 2020-01-18 ENCOUNTER — Telehealth (INDEPENDENT_AMBULATORY_CARE_PROVIDER_SITE_OTHER): Payer: Self-pay | Admitting: Gastroenterology

## 2020-01-18 DIAGNOSIS — Z1211 Encounter for screening for malignant neoplasm of colon: Secondary | ICD-10-CM

## 2020-01-18 MED ORDER — NA SULFATE-K SULFATE-MG SULF 17.5-3.13-1.6 GM/177ML PO SOLN: 1.0000 | Freq: Once | ORAL | 0 refills | Status: AC

## 2020-01-18 NOTE — Progress Notes (Signed)
Gastroenterology Pre-Procedure Review  Request Date: Thursday 02/01/20 Requesting Physician: Dr. Marius Ditch  PATIENT REVIEW QUESTIONS: The patient responded to the following health history questions as indicated:    1. Are you having any GI issues? no 2. Do you have a personal history of Polyps? no 3. Do you have a family history of Colon Cancer or Polyps? no 4. Diabetes Mellitus? yes (TYPE 1) 5. Joint replacements in the past 12 months?no 6. Major health problems in the past 3 months?yes (DKA ER Visit 11/06/19) 7. Any artificial heart valves, MVP, or defibrillator?no    MEDICATIONS & ALLERGIES:    Patient reports the following regarding taking any anticoagulation/antiplatelet therapy:   Plavix, Coumadin, Eliquis, Xarelto, Lovenox, Pradaxa, Brilinta, or Effient? no Aspirin? yes (81 mg daily)  Patient confirms/reports the following medications:  Current Outpatient Medications  Medication Sig Dispense Refill   amLODipine (NORVASC) 10 MG tablet Take 1 tablet (10 mg total) by mouth daily. 90 tablet 1   aspirin 81 MG chewable tablet Chew 1 tablet by mouth daily.     insulin aspart (NOVOLOG) 100 UNIT/ML injection Inject up to 14 units three times daily before meals according to sliding scale 10 mL 5   insulin degludec (TRESIBA FLEXTOUCH) 100 UNIT/ML FlexTouch Pen Inject 30 Units into the skin daily.     lovastatin (MEVACOR) 40 MG tablet Take 1 tablet (40 mg total) by mouth at bedtime. 90 tablet 4   pregabalin (LYRICA) 75 MG capsule Take 1 capsule (75 mg total) by mouth 2 (two) times daily. 60 capsule 3   Na Sulfate-K Sulfate-Mg Sulf 17.5-3.13-1.6 GM/177ML SOLN Take 1 kit by mouth once for 1 dose. 354 mL 0   pantoprazole (PROTONIX) 40 MG tablet Take 40 mg by mouth daily.  (Patient not taking: Reported on 01/18/2020)     No current facility-administered medications for this visit.    Patient confirms/reports the following allergies:  Allergies  Allergen Reactions   Penicillins Other  (See Comments)    Has patient had a PCN reaction causing immediate rash, facial/tongue/throat swelling, SOB or lightheadedness with hypotension: No Has patient had a PCN reaction causing severe rash involving mucus membranes or skin necrosis: No Has patient had a PCN reaction that required hospitalization No Has patient had a PCN reaction occurring within the last 10 years: No If all of the above answers are "NO", then may proceed with Cephalosporin use.     Orders Placed This Encounter  Procedures   Procedural/ Surgical Case Request: COLONOSCOPY WITH PROPOFOL    Standing Status:   Standing    Number of Occurrences:   1    Order Specific Question:   Pre-op diagnosis    Answer:   screening colonoscopy    Order Specific Question:   CPT Code    Answer:   59458    AUTHORIZATION INFORMATION Primary Insurance: 1D#: Group #:  Secondary Insurance: 1D#: Group #:  SCHEDULE INFORMATION: Date: 02/01/20 Time: Location:[facility]

## 2020-01-30 ENCOUNTER — Other Ambulatory Visit: Payer: Self-pay

## 2020-01-30 ENCOUNTER — Other Ambulatory Visit
Admission: RE | Admit: 2020-01-30 | Discharge: 2020-01-30 | Disposition: A | Discharge status: Home / Self Care | Payer: Medicare HMO | Source: Ambulatory Visit | Attending: Gastroenterology | Admitting: Gastroenterology

## 2020-01-30 DIAGNOSIS — Z01812 Encounter for preprocedural laboratory examination: Secondary | ICD-10-CM | POA: Diagnosis not present

## 2020-01-30 DIAGNOSIS — Z20822 Contact with and (suspected) exposure to covid-19: Secondary | ICD-10-CM | POA: Diagnosis not present

## 2020-01-30 LAB — SARS CORONAVIRUS 2 (TAT 6-24 HRS): SARS Coronavirus 2: NEGATIVE

## 2020-01-31 ENCOUNTER — Encounter: Payer: Self-pay | Admitting: Gastroenterology

## 2020-01-31 ENCOUNTER — Telehealth: Payer: Self-pay | Admitting: Gastroenterology

## 2020-01-31 ENCOUNTER — Other Ambulatory Visit: Payer: Self-pay | Admitting: Family Medicine

## 2020-01-31 ENCOUNTER — Other Ambulatory Visit: Payer: Self-pay

## 2020-01-31 MED ORDER — GOLYTELY 236 G PO SOLR: 4000.0000 mL | Freq: Once | ORAL | 0 refills | Status: AC

## 2020-01-31 NOTE — Telephone Encounter (Signed)
Requested Prescriptions  Pending Prescriptions Disp Refills  . TRESIBA FLEXTOUCH 100 UNIT/ML FlexTouch Pen [Pharmacy Med Name: [name] FlexTouch 100 UNIT/ML Subcutaneous Solution Pen-injector] 15 mL 0    Sig: INJECT 30 UNITS SUBCUTANEOUSLY ONCE DAILY. INCREASE AS DIRECTED TO KEEP FASTING GLUCOSE LESS THAN 120.     Endocrinology:  Diabetes - Insulins Failed - 01/31/2020  1:27 PM      Failed - HBA1C is between 0 and 7.9 and within 180 days    Hemoglobin A1C  Date Value Ref Range Status  12/25/2019 9.5 (A) 4.0 - 5.6 % Final   Hgb A1c MFr Bld  Date Value Ref Range Status  07/27/2019 12.0 (H) 4.8 - 5.6 % Final    Comment:    (NOTE) Pre diabetes:          5.7%-6.4% Diabetes:              >6.4% Glycemic control for   <7.0% adults with diabetes          Passed - Valid encounter within last 6 months    Recent Outpatient Visits          1 month ago Uncontrolled type 2 diabetes mellitus with hyperglycemia [facility])   [facility] Birdie Sons, MD   4 months ago Uncontrolled type 2 diabetes mellitus with hyperglycemia [facility])   [facility] Birdie Sons, MD   5 months ago Uncontrolled type 2 diabetes mellitus with hyperglycemia [facility])   [facility] Birdie Sons, MD   8 months ago Uncontrolled type 1 diabetes mellitus with hyperglycemia [facility])   [facility] Birdie Sons, MD   9 months ago Type 1 diabetes mellitus with diabetic neuropathy, unspecified [facility])   [place], Wendee Beavers, PA-C      Future Appointments            In 1 month Fisher, Kirstie Peri, MD [facility] [place]

## 2020-01-31 NOTE — Telephone Encounter (Signed)
[facility] calling stating they do not have prescription for pt prep TOMORROW 11.4.21 and pt is there to pick it up. Please resend prep or get pt sample for tomorrow. Pt had screening call on 10.21.21.

## 2020-02-01 ENCOUNTER — Ambulatory Visit: Payer: Medicare HMO | Admitting: Certified Registered"

## 2020-02-01 ENCOUNTER — Other Ambulatory Visit: Payer: Self-pay

## 2020-02-01 ENCOUNTER — Encounter
Admission: RE | Disposition: A | Discharge status: Home / Self Care | Payer: Self-pay | Source: Home / Self Care | Attending: Gastroenterology

## 2020-02-01 ENCOUNTER — Ambulatory Visit
Admission: RE | Admit: 2020-02-01 | Discharge: 2020-02-01 | Disposition: A | Discharge status: Home / Self Care | Payer: Medicare HMO | Attending: Gastroenterology | Admitting: Gastroenterology

## 2020-02-01 ENCOUNTER — Encounter: Payer: Self-pay | Admitting: Gastroenterology

## 2020-02-01 DIAGNOSIS — N1831 Chronic kidney disease, stage 3a: Secondary | ICD-10-CM | POA: Diagnosis not present

## 2020-02-01 DIAGNOSIS — K649 Unspecified hemorrhoids: Secondary | ICD-10-CM | POA: Diagnosis not present

## 2020-02-01 DIAGNOSIS — K259 Gastric ulcer, unspecified as acute or chronic, without hemorrhage or perforation: Secondary | ICD-10-CM | POA: Diagnosis not present

## 2020-02-01 DIAGNOSIS — E1122 Type 2 diabetes mellitus with diabetic chronic kidney disease: Secondary | ICD-10-CM | POA: Diagnosis not present

## 2020-02-01 DIAGNOSIS — K644 Residual hemorrhoidal skin tags: Secondary | ICD-10-CM | POA: Insufficient documentation

## 2020-02-01 DIAGNOSIS — K279 Peptic ulcer, site unspecified, unspecified as acute or chronic, without hemorrhage or perforation: Secondary | ICD-10-CM | POA: Diagnosis not present

## 2020-02-01 DIAGNOSIS — R1319 Other dysphagia: Secondary | ICD-10-CM

## 2020-02-01 DIAGNOSIS — B9681 Helicobacter pylori [H. pylori] as the cause of diseases classified elsewhere: Secondary | ICD-10-CM | POA: Insufficient documentation

## 2020-02-01 DIAGNOSIS — R1310 Dysphagia, unspecified: Secondary | ICD-10-CM | POA: Insufficient documentation

## 2020-02-01 DIAGNOSIS — E119 Type 2 diabetes mellitus without complications: Secondary | ICD-10-CM | POA: Diagnosis not present

## 2020-02-01 DIAGNOSIS — K2950 Unspecified chronic gastritis without bleeding: Secondary | ICD-10-CM | POA: Diagnosis not present

## 2020-02-01 DIAGNOSIS — K2930 Chronic superficial gastritis without bleeding: Secondary | ICD-10-CM | POA: Diagnosis not present

## 2020-02-01 DIAGNOSIS — Z1211 Encounter for screening for malignant neoplasm of colon: Secondary | ICD-10-CM | POA: Diagnosis not present

## 2020-02-01 DIAGNOSIS — I129 Hypertensive chronic kidney disease with stage 1 through stage 4 chronic kidney disease, or unspecified chronic kidney disease: Secondary | ICD-10-CM | POA: Diagnosis not present

## 2020-02-01 DIAGNOSIS — R933 Abnormal findings on diagnostic imaging of other parts of digestive tract: Secondary | ICD-10-CM | POA: Diagnosis not present

## 2020-02-01 HISTORY — PX: ESOPHAGOGASTRODUODENOSCOPY: SHX5428

## 2020-02-01 HISTORY — PX: COLONOSCOPY WITH PROPOFOL: SHX5780

## 2020-02-01 LAB — GLUCOSE, CAPILLARY: Glucose-Capillary: 174 mg/dL — ABNORMAL HIGH (ref 70–99)

## 2020-02-01 SURGERY — COLONOSCOPY WITH PROPOFOL
Anesthesia: General

## 2020-02-01 MED ORDER — GLYCOPYRROLATE 0.2 MG/ML IJ SOLN
INTRAMUSCULAR | Status: DC | PRN
  Administered 2020-02-01: .2 mg via INTRAVENOUS

## 2020-02-01 MED ORDER — PROPOFOL 500 MG/50ML IV EMUL
INTRAVENOUS | Status: DC | PRN
  Administered 2020-02-01: 145 ug/kg/min via INTRAVENOUS

## 2020-02-01 MED ORDER — PHENYLEPHRINE HCL (PRESSORS) 10 MG/ML IV SOLN
INTRAVENOUS | Status: DC | PRN
  Administered 2020-02-01 (×2): 100 ug via INTRAVENOUS

## 2020-02-01 MED ORDER — PROPOFOL 10 MG/ML IV BOLUS
INTRAVENOUS | Status: DC | PRN
  Administered 2020-02-01: 50 mg via INTRAVENOUS

## 2020-02-01 MED ORDER — TRESIBA FLEXTOUCH 100 UNIT/ML ~~LOC~~ SOPN
PEN_INJECTOR | SUBCUTANEOUS | 0 refills | Status: DC
Start: 2020-02-01 — End: 2020-02-23

## 2020-02-01 MED ORDER — SODIUM CHLORIDE 0.9 % IV SOLN: INTRAVENOUS | Status: DC

## 2020-02-01 MED ORDER — OMEPRAZOLE 40 MG PO CPDR: 40.0000 mg | DELAYED_RELEASE_CAPSULE | Freq: Two times a day (BID) | ORAL | 2 refills | Status: DC

## 2020-02-01 MED ORDER — LIDOCAINE HCL (CARDIAC) PF 100 MG/5ML IV SOSY
PREFILLED_SYRINGE | INTRAVENOUS | Status: DC | PRN
  Administered 2020-02-01: 100 mg via INTRAVENOUS

## 2020-02-01 NOTE — Op Note (Signed)
[facility] Gastroenterology Patient Name: Nicholas Weiss Procedure Date: 02/01/2020 10:19 AM MRN: [id number] Account #: [id number] Date of Birth: [birth year] Admit Type: Outpatient Age: 69 Room: [facility] ENDO ROOM 4 Gender: Male Note Status: Finalized Procedure:             Colonoscopy Indications:           Screening for colorectal malignant neoplasm, This is                         the patient's first colonoscopy Providers:             Lin Landsman MD, MD Referring MD:          Kirstie Peri. Caryn Section, MD (Referring MD) Medicines:             General Anesthesia Complications:         No immediate complications. Estimated blood loss: None. Procedure:             Pre-Anesthesia Assessment:                        - Prior to the procedure, a History and Physical was                         performed, and patient medications and allergies were                         reviewed. The patient is competent. The risks and                         benefits of the procedure and the sedation options and                         risks were discussed with the patient. All questions                         were answered and informed consent was obtained.                         Patient identification and proposed procedure were                         verified by the physician, the nurse, the                         anesthesiologist, the anesthetist and the technician                         in the pre-procedure area in the procedure room in the                         endoscopy suite. Mental Status Examination: alert and                         oriented. Airway Examination: normal oropharyngeal                         airway and neck mobility. Respiratory Examination:  clear to auscultation. CV Examination: normal.                         Prophylactic Antibiotics: The patient does not require                         prophylactic antibiotics. Prior Anticoagulants:  The                         patient has taken no previous anticoagulant or                         antiplatelet agents. ASA Grade Assessment: III - A                         patient with severe systemic disease. After reviewing                         the risks and benefits, the patient was deemed in                         satisfactory condition to undergo the procedure. The                         anesthesia plan was to use general anesthesia.                         Immediately prior to administration of medications,                         the patient was re-assessed for adequacy to receive                         sedatives. The heart rate, respiratory rate, oxygen                         saturations, blood pressure, adequacy of pulmonary                         ventilation, and response to care were monitored                         throughout the procedure. The physical status of the                         patient was re-assessed after the procedure.                        After obtaining informed consent, the colonoscope was                         passed under direct vision. Throughout the procedure,                         the patient's blood pressure, pulse, and oxygen                         saturations were monitored continuously. The  Colonoscope was introduced through the anus and                         advanced to the the cecum, identified by appendiceal                         orifice and ileocecal valve. The colonoscopy was                         performed without difficulty. The patient tolerated                         the procedure well. The quality of the bowel                         preparation was evaluated using the BBPS [facility] Bowel                         Preparation Scale) with scores of: Right Colon = 2                         (minor amount of residual staining, small fragments of                         stool and/or opaque liquid, but  mucosa seen well),                         Transverse Colon = 2 (minor amount of residual                         staining, small fragments of stool and/or opaque                         liquid, but mucosa seen well) and Left Colon = 2                         (minor amount of residual staining, small fragments of                         stool and/or opaque liquid, but mucosa seen well). The                         total BBPS score equals 6. Findings:      Hemorrhoids were found on perianal exam.      Copious quantities of semi-liquid stool was found in the entire colon,       precluding visualization. Lavage of the area was performed using 50 -       200 mL of sterile water, resulting in clearance with fair visualization.      Non-bleeding external hemorrhoids were found during retroflexion. The       hemorrhoids were medium-sized. Impression:            - Hemorrhoids found on perianal exam.                        - Stool in the entire examined colon.                        -  Non-bleeding external hemorrhoids.                        - No specimens collected. Recommendation:        - Discharge patient to home (with escort).                        - Resume previous diet today.                        - Continue present medications.                        - Repeat colonoscopy in 2 years with 2 day prep                         because the bowel preparation was suboptimal. Procedure Code(s):     --- Professional ---                        Z6109, Colorectal cancer screening; colonoscopy on                         individual not meeting criteria for high risk Diagnosis Code(s):     --- Professional ---                        Z12.11, Encounter for screening for malignant neoplasm                         of colon                        K64.4, Residual hemorrhoidal skin tags CPT copyright 2019 American Medical Association. All rights reserved. The codes documented in this report are preliminary  and upon coder review may  be revised to meet current compliance requirements. Dr. Ulyess Mort Lin Landsman MD, MD 02/01/2020 11:11:01 AM This report has been signed electronically. Number of Addenda: 0 Note Initiated On: 02/01/2020 10:19 AM Scope Withdrawal Time: 0 hours 12 minutes 58 seconds  Total Procedure Duration: 0 hours 16 minutes 48 seconds  Estimated Blood Loss:  Estimated blood loss: none.      [facility]

## 2020-02-01 NOTE — Anesthesia Postprocedure Evaluation (Signed)
Anesthesia Post Note  Patient: Nicholas Weiss  Procedure(s) Performed: COLONOSCOPY WITH PROPOFOL (N/A ) ESOPHAGOGASTRODUODENOSCOPY (EGD)  Patient location during evaluation: Endoscopy Anesthesia Type: General Level of consciousness: awake and alert and oriented Pain management: pain level controlled Vital Signs Assessment: post-procedure vital signs reviewed and stable Respiratory status: spontaneous breathing Cardiovascular status: blood pressure returned to baseline Anesthetic complications: no   No complications documented.   Last Vitals:  Vitals:   02/01/20 0925 02/01/20 1113  BP: (!) 143/82 117/67  Pulse: 87 66  Resp: 18 15  Temp: (!) 36.1 C (P) 36.6 C  SpO2: 100% 100%    Last Pain:  Vitals:   02/01/20 0925  TempSrc: Temporal  PainSc: 0-No pain                 Daielle Melcher

## 2020-02-01 NOTE — Transfer of Care (Signed)
Immediate Anesthesia Transfer of Care Note  Patient: Nicholas Weiss  Procedure(s) Performed: COLONOSCOPY WITH PROPOFOL (N/A ) ESOPHAGOGASTRODUODENOSCOPY (EGD)  Patient Location: Endoscopy Unit  Anesthesia Type:General  Level of Consciousness: drowsy and patient cooperative  Airway & Oxygen Therapy: Patient Spontanous Breathing and Patient connected to face mask oxygen  Post-op Assessment: Report given to RN and Post -op Vital signs reviewed and stable  Post vital signs: Reviewed and stable  Last Vitals:  Vitals Value Taken Time  BP 117/67 02/01/20 1112  Temp    Pulse 66 02/01/20 1113  Resp 15 02/01/20 1113  SpO2 100 % 02/01/20 1113  Vitals shown include unvalidated device data.  Last Pain:  Vitals:   02/01/20 0925  TempSrc: Temporal  PainSc: 0-No pain         Complications: No complications documented.

## 2020-02-01 NOTE — Anesthesia Procedure Notes (Signed)
Procedure Name: General with mask airway Performed by: Fletcher-Harrison, Abia Monaco, CRNA Pre-anesthesia Checklist: Patient identified, Emergency Drugs available, Suction available and Patient being monitored Patient Re-evaluated:Patient Re-evaluated prior to induction Oxygen Delivery Method: Simple face mask Induction Type: IV induction Placement Confirmation: positive ETCO2 and CO2 detector Dental Injury: Teeth and Oropharynx as per pre-operative assessment        

## 2020-02-01 NOTE — Anesthesia Preprocedure Evaluation (Addendum)
Anesthesia Evaluation  Patient identified by MRN, date of birth, ID band Patient awake    Reviewed: Allergy & Precautions, NPO status , Patient's Chart, lab work & pertinent test results  Airway Mallampati: II  TM Distance: >3 FB     Dental  (+) Upper Dentures, Partial Lower   Pulmonary neg pulmonary ROS,    Pulmonary exam normal        Cardiovascular hypertension, Normal cardiovascular exam     Neuro/Psych negative neurological ROS  negative psych ROS   GI/Hepatic negative GI ROS, Neg liver ROS,   Endo/Other  diabetes  Renal/GU Renal InsufficiencyRenal disease  negative genitourinary   Musculoskeletal negative musculoskeletal ROS (+)   Abdominal Normal abdominal exam  (+)   Peds negative pediatric ROS (+)  Hematology   Anesthesia Other Findings   Reproductive/Obstetrics                            Anesthesia Physical Anesthesia Plan  ASA: III  Anesthesia Plan: General   Post-op Pain Management:    Induction: Intravenous  PONV Risk Score and Plan:   Airway Management Planned: Nasal Cannula  Additional Equipment:   Intra-op Plan:   Post-operative Plan:   Informed Consent: I have reviewed the patients History and Physical, chart, labs and discussed the procedure including the risks, benefits and alternatives for the proposed anesthesia with the patient or authorized representative who has indicated his/her understanding and acceptance.     Dental advisory given  Plan Discussed with: CRNA and Surgeon  Anesthesia Plan Comments:         Anesthesia Quick Evaluation

## 2020-02-01 NOTE — Op Note (Signed)
[facility] Gastroenterology Patient Name: Nicholas Weiss Procedure Date: 02/01/2020 10:27 AM MRN: [id number] Account #: [id number] Date of Birth: 1951/01/02 Admit Type: Outpatient Age: 69 Room: [facility] ENDO ROOM 4 Gender: Male Note Status: Finalized Procedure:             Upper GI endoscopy Indications:           Dysphagia Providers:             Lin Landsman MD, MD Referring MD:          Kirstie Peri. Caryn Section, MD (Referring MD) Medicines:             General Anesthesia Complications:         No immediate complications. Estimated blood loss: None. Procedure:             Pre-Anesthesia Assessment:                        - Prior to the procedure, a History and Physical was                         performed, and patient medications and allergies were                         reviewed. The patient is competent. The risks and                         benefits of the procedure and the sedation options and                         risks were discussed with the patient. All questions                         were answered and informed consent was obtained.                         Patient identification and proposed procedure were                         verified by the physician, the nurse, the                         anesthesiologist, the anesthetist and the technician                         in the pre-procedure area in the procedure room in the                         endoscopy suite. Mental Status Examination: alert and                         oriented. Airway Examination: normal oropharyngeal                         airway and neck mobility. Respiratory Examination:                         clear to auscultation. CV Examination: normal.  Prophylactic Antibiotics: The patient does not require                         prophylactic antibiotics. Prior Anticoagulants: The                         patient has taken no previous anticoagulant or                          antiplatelet agents. ASA Grade Assessment: III - A                         patient with severe systemic disease. After reviewing                         the risks and benefits, the patient was deemed in                         satisfactory condition to undergo the procedure. The                         anesthesia plan was to use general anesthesia.                         Immediately prior to administration of medications,                         the patient was re-assessed for adequacy to receive                         sedatives. The heart rate, respiratory rate, oxygen                         saturations, blood pressure, adequacy of pulmonary                         ventilation, and response to care were monitored                         throughout the procedure. The physical status of the                         patient was re-assessed after the procedure.                        After obtaining informed consent, the endoscope was                         passed under direct vision. Throughout the procedure,                         the patient's blood pressure, pulse, and oxygen                         saturations were monitored continuously. The Endoscope                         was introduced through the mouth, and advanced to the  second part of duodenum. The upper GI endoscopy was                         accomplished without difficulty. The patient tolerated                         the procedure well. Findings:      The duodenal bulb and second portion of the duodenum were normal.      Many non-bleeding superficial gastric ulcers with a clean ulcer base       (Forrest Class III) were found on the greater curvature of the gastric       body, at the incisura, in the gastric antrum and in the prepyloric       region of the stomach. The largest lesion was 5 mm in largest dimension.      The gastric fundus, gastric body and gastric antrum were normal.        Biopsies were taken with a cold forceps for Helicobacter pylori testing.      The cardia and gastric fundus were normal on retroflexion.      Esophagogastric landmarks were identified: the gastroesophageal junction       was found at 40 cm from the incisors.      The gastroesophageal junction and examined esophagus were normal.       Biopsies were taken with a cold forceps for histology. Impression:            - Normal duodenal bulb and second portion of the                         duodenum.                        - Non-bleeding gastric ulcers with a clean ulcer base                         (Forrest Class III).                        - Normal gastric fundus, gastric body and antrum.                         Biopsied.                        - Esophagogastric landmarks identified.                        - Normal gastroesophageal junction and esophagus.                         Biopsied. Recommendation:        - Await pathology results.                        - Use Prilosec (omeprazole) 40 mg PO BID for 8 weeks.                        - Return to my office in 2 months.                        - Proceed with colonoscopy  as scheduled                        See colonoscopy report Procedure Code(s):     --- Professional ---                        [phone], Esophagogastroduodenoscopy, flexible,                         transoral; with biopsy, single or multiple Diagnosis Code(s):     --- Professional ---                        K25.9, Gastric ulcer, unspecified as acute or chronic,                         without hemorrhage or perforation                        R13.10, Dysphagia, unspecified CPT copyright 2019 American Medical Association. All rights reserved. The codes documented in this report are preliminary and upon coder review may  be revised to meet current compliance requirements. Dr. Ulyess Mort Lin Landsman MD, MD 02/01/2020 10:50:19 AM This report has been signed  electronically. Number of Addenda: 0 Note Initiated On: 02/01/2020 10:27 AM Estimated Blood Loss:  Estimated blood loss: none.      [facility]

## 2020-02-01 NOTE — Telephone Encounter (Signed)
Patient walked into the office requesting a sample of Tresiba. One sample pen given to patient. Patient has follow up appointment scheduled for 03/11/2020 with Dr. Caryn Section.

## 2020-02-01 NOTE — H&P (Signed)
Nicholas Darby, MD [address]  [place]  [place], [place] [address]  Main: [phone]  Fax: [phone] Pager: [phone]  Primary Care Physician:  Birdie Sons, MD Primary Gastroenterologist:  Dr. Cephas Weiss  Pre-Procedure History & Physical: HPI:  Nicholas Weiss is a 69 y.o. male is here for an endoscopy and colonoscopy.   Past Medical History:  Diagnosis Date  . DKA (diabetic ketoacidoses) 04/06/2016  . Hypercholesteremia   . Hypertension     Past Surgical History:  Procedure Laterality Date  . KNEE SURGERY Right    Torn meniscus  . KNEE SURGERY Left     Prior to Admission medications   Medication Sig Start Date End Date Taking? Authorizing Provider  amLODipine (NORVASC) 10 MG tablet Take 1 tablet (10 mg total) by mouth daily. 09/18/19   Birdie Sons, MD  aspirin 81 MG chewable tablet Chew 1 tablet by mouth daily.    [provider]  insulin aspart (NOVOLOG) 100 UNIT/ML injection Inject up to 14 units three times daily before meals according to sliding scale 12/26/19   Birdie Sons, MD  lovastatin (MEVACOR) 40 MG tablet Take 1 tablet (40 mg total) by mouth at bedtime. 08/02/18   Birdie Sons, MD  pantoprazole (PROTONIX) 40 MG tablet Take 40 mg by mouth daily.  Patient not taking: Reported on 01/18/2020    [provider]  pregabalin (LYRICA) 75 MG capsule Take 1 capsule (75 mg total) by mouth 2 (two) times daily. 12/25/19   Birdie Sons, MD  TRESIBA FLEXTOUCH 100 UNIT/ML FlexTouch Pen INJECT 30 UNITS SUBCUTANEOUSLY ONCE DAILY. INCREASE AS DIRECTED TO KEEP FASTING GLUCOSE LESS THAN 120. 01/31/20   Birdie Sons, MD    Allergies as of 01/18/2020 - Review Complete 01/18/2020  Allergen Reaction Noted  . Penicillins Other (See Comments) 10/16/2014    Family History  Problem Relation Age of Onset  . Heart attack Father   . Hypertension Sister   . Cancer Sister     Social History   Socioeconomic History  . Marital  status: Significant Other    Spouse name: Not on file  . Number of children: 3  . Years of education: Not on file  . 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 Smoking status: Never Smoker  . Smokeless tobacco: Never Used  Vaping Use  . Vaping Use: Never used  Substance and Sexual Activity  . Alcohol use: Yes    Alcohol/week: 0.0 - 1.0 standard drinks  . Drug use: Yes    Types: Marijuana  . Sexual activity: Yes    Birth control/protection: None  Other Topics Concern  . Not on file  Social History Narrative  . Not on file   Social Determinants of Health   Financial Resource Strain: Low Risk   . Difficulty of Paying Living Expenses: Not hard at all  Food Insecurity: No Food Insecurity  . Worried About Charity fundraiser in the Last Year: Never true  . Ran Out of Food in the Last Year: Never true  Transportation Needs: No Transportation Needs  . Lack of Transportation (Medical): No  . Lack of Transportation (Non-Medical): No  Physical Activity: Insufficiently Active  . Days of Exercise per Week: 7 days  . Minutes of Exercise per Session: 20 min  Stress: No Stress Concern Present  . Feeling of Stress : Not at  all  Social Connections: Moderately Isolated  . Frequency of Communication with Friends and Family: More than three times a week  . Frequency of Social Gatherings with Friends and Family: More than three times a week  . Attends Religious Services: Never  . Active Member of Clubs or Organizations: No  . Attends Archivist Meetings: Never  . Marital Status: Living with partner  Intimate Partner Violence: Not At Risk  . Fear of Current or Ex-Partner: No  . Emotionally Abused: No  . Physically Abused: No  . Sexually Abused: No    Review of Systems: See HPI, otherwise negative ROS  Physical Exam: BP (!) 143/82   Pulse 87   Temp (!) 97 F (36.1 C) (Temporal)   Resp 18   Ht 5'  10" (1.778 m)   Wt 76.2 kg   SpO2 100%   BMI 24.11 kg/m  General:   Alert,  pleasant and cooperative in NAD Head:  Normocephalic and atraumatic. Neck:  Supple; no masses or thyromegaly. Lungs:  Clear throughout to auscultation.    Heart:  Regular rate and rhythm. Abdomen:  Soft, nontender and nondistended. Normal bowel sounds, without guarding, and without rebound.   Neurologic:  Alert and  oriented x4;  grossly normal neurologically.  Impression/Plan: Nicholas Weiss is here for an endoscopy and colonoscopy to be performed for dysphagia, esophageal thickening on CT, colon cancer screening  Risks, benefits, limitations, and alternatives regarding  endoscopy and colonoscopy have been reviewed with the patient.  Questions have been answered.  All parties agreeable.   Sherri Sear, MD  02/01/2020, 9:28 AM

## 2020-02-02 ENCOUNTER — Encounter: Payer: Self-pay | Admitting: Gastroenterology

## 2020-02-02 LAB — SURGICAL PATHOLOGY

## 2020-02-05 ENCOUNTER — Telehealth: Payer: Self-pay

## 2020-02-05 DIAGNOSIS — A048 Other specified bacterial intestinal infections: Secondary | ICD-10-CM

## 2020-02-05 MED ORDER — AMOXICILLIN 500 MG PO TABS: 1000.0000 mg | ORAL_TABLET | Freq: Two times a day (BID) | ORAL | 0 refills | Status: DC

## 2020-02-05 MED ORDER — CLARITHROMYCIN 500 MG PO TABS: 500.0000 mg | ORAL_TABLET | Freq: Two times a day (BID) | ORAL | 0 refills | Status: AC

## 2020-02-05 NOTE — Telephone Encounter (Signed)
-----   Message from Lin Landsman, MD sent at 02/05/2020  9:09 AM EST ----- Plz send him the prescription for triple therapy to treat H Pylori for 14days Patient is already on omeprazole   Clarithromycin 500mg  BID Metronidazole 500 mg 3 times daily  Order H Pylori breath test in 4weeks after completing medication to confirm eradication. She should be off prilosec and H2 blocker atleast for 2weeks before the test  Thanks RV

## 2020-02-05 NOTE — Telephone Encounter (Signed)
Tried to call patient but voicemail is not set up  

## 2020-02-05 NOTE — Telephone Encounter (Signed)
Tried to call voice mail is not set up.

## 2020-02-05 NOTE — Telephone Encounter (Signed)
Sent medication to the pharmacy and order lab work in 6 weeks. Tried to call patient but voicemail is not set up

## 2020-02-06 MED ORDER — METRONIDAZOLE 500 MG PO TABS: 500.0000 mg | ORAL_TABLET | Freq: Three times a day (TID) | ORAL | 0 refills | Status: AC

## 2020-02-06 NOTE — Telephone Encounter (Signed)
Called [facility] and canceled the Amoxicillin called in the Flagyl. Patient verbalized understanding of results and will pick up medications and come back in 6 weeks for repeat H pylori

## 2020-02-23 ENCOUNTER — Other Ambulatory Visit: Payer: Self-pay | Admitting: Family Medicine

## 2020-03-11 ENCOUNTER — Ambulatory Visit (INDEPENDENT_AMBULATORY_CARE_PROVIDER_SITE_OTHER): Payer: Medicare HMO | Admitting: Family Medicine

## 2020-03-11 ENCOUNTER — Other Ambulatory Visit: Payer: Self-pay

## 2020-03-11 ENCOUNTER — Encounter: Payer: Self-pay | Admitting: Family Medicine

## 2020-03-11 VITALS — BP 151/78 | HR 87 | Temp 98.4°F | Resp 16 | Ht 70.0 in | Wt 171.0 lb

## 2020-03-11 DIAGNOSIS — E1165 Type 2 diabetes mellitus with hyperglycemia: Secondary | ICD-10-CM | POA: Diagnosis not present

## 2020-03-11 DIAGNOSIS — N1832 Chronic kidney disease, stage 3b: Secondary | ICD-10-CM

## 2020-03-11 LAB — POCT GLYCOSYLATED HEMOGLOBIN (HGB A1C)
Est. average glucose Bld gHb Est-mCnc: 243
Hemoglobin A1C: 10.1 % — AB (ref 4.0–5.6)

## 2020-03-11 NOTE — Patient Instructions (Signed)
.   Please review the attached list of medications and notify my office if there are any errors.   Check blood sugar before each meal      If blood sugar is >=150, but <200, take 6 units Humalog insulin before eating     If blood sugar is >=200, but <250, take 8 units Humalog insulin     If blood sugar is >=250, but <300, take 10 units Humalog insulin     If blood sugar is >=300, but <350, take 12 units Humalog insulin     If blood sugar is >=350, but <400, take 14 units Humalog insulin     If blood sugar is >400, take 16 units Humalog insulin

## 2020-03-11 NOTE — Progress Notes (Signed)
Established patient visit   Patient: Nicholas Weiss   DOB: 1950-12-15   69 y.o. Male  MRN: [id number] Visit Date: 03/11/2020  Today's healthcare provider: Lelon Huh, MD   Chief Complaint  Patient presents with  . Diabetes  . Hypertension   Subjective    HPI  Diabetes Mellitus Type II, Follow-up  Lab Results  Component Value Date   HGBA1C 10.1 (A) 03/11/2020   HGBA1C 9.5 (A) 12/25/2019   HGBA1C 9.7 (A) 09/18/2019   Wt Readings from Last 3 Encounters:  03/11/20 171 lb (77.6 kg)  02/01/20 168 lb (76.2 kg)  12/25/19 169 lb 9.6 oz (76.9 kg)   Last seen for diabetes 2 months ago.  Management since then includes increasing Tresiba from 24 units to 30 units daily . He was having some morning hypoglycemia at that dose so he has cut back to 28 units and is taking consistently every morning. He continues on Humalog SSI starting with 3 units around a  Blood sugar of 200 before meals.  He reports good compliance with treatment. He is not having side effects.  Symptoms: No fatigue No foot ulcerations  No appetite changes No nausea  No paresthesia of the feet  No polydipsia  No polyuria No visual disturbances   No vomiting     Home blood sugar records: fasting range: 110s However, patient reports that his sugars get really high (over 200s) in the evenings.   Episodes of hypoglycemia? No   Most Recent Eye Exam: 05/2019 Current exercise: no regular exercise Current diet habits: well balanced  Pertinent Labs: Lab Results  Component Value Date   CHOL 138 12/19/2018   HDL 46 12/19/2018   LDLCALC 78 12/19/2018   TRIG 70 12/19/2018   CHOLHDL 3.0 12/19/2018   Lab Results  Component Value Date   NA 143 11/07/2019   K 4.2 11/07/2019   CREATININE 3.97 (H) 11/07/2019   GFRNONAA 15 (L) 11/07/2019   GFRAA 17 (L) 11/07/2019   GLUCOSE 148 (H) 11/07/2019     Hypertension, follow-up  BP Readings from Last 3 Encounters:  03/11/20 (!) 151/78  02/01/20 137/87  12/25/19  (!) 162/92   Wt Readings from Last 3 Encounters:  03/11/20 171 lb (77.6 kg)  02/01/20 168 lb (76.2 kg)  12/25/19 169 lb 9.6 oz (76.9 kg)     He was last seen for hypertension 2 months ago.  BP at that visit was 162/92. Management since that visit includes no changes.  He reports good compliance with treatment. He is not having side effects.  He is following a Regular diet. He is not exercising. He does smoke.  Use of agents associated with hypertension: none.   Outside blood pressures are checked occasionally where he works. Patient reports 130s/80s. Symptoms: No chest pain No chest pressure  No palpitations No syncope  No dyspnea No orthopnea  No paroxysmal nocturnal dyspnea No lower extremity edema   Pertinent labs: Lab Results  Component Value Date   CHOL 138 12/19/2018   HDL 46 12/19/2018   LDLCALC 78 12/19/2018   TRIG 70 12/19/2018   CHOLHDL 3.0 12/19/2018   Lab Results  Component Value Date   NA 143 11/07/2019   K 4.2 11/07/2019   CREATININE 3.97 (H) 11/07/2019   GFRNONAA 15 (L) 11/07/2019   GFRAA 17 (L) 11/07/2019   GLUCOSE 148 (H) 11/07/2019     The 10-year ASCVD risk score Mikey Bussing DC Jr., et al., 2013) is: 35.4%  Medications: Outpatient Medications Prior to Visit  Medication Sig  . amLODipine (NORVASC) 10 MG tablet Take 1 tablet (10 mg total) by mouth daily.  Marland Kitchen aspirin 81 MG chewable tablet Chew 1 tablet by mouth daily.  . insulin aspart (NOVOLOG) 100 UNIT/ML injection Inject up to 14 units three times daily before meals according to sliding scale  . lovastatin (MEVACOR) 40 MG tablet Take 1 tablet (40 mg total) by mouth at bedtime.  . pregabalin (LYRICA) 75 MG capsule Take 1 capsule (75 mg total) by mouth 2 (two) times daily.  . TRESIBA FLEXTOUCH 100 UNIT/ML FlexTouch Pen INJECT 30 UNITS SUBCUTANEOUSLY ONCE DAILY. INCREASE AS DIRECTED TO KEEP FASTING GLUCOSE LESS THAN 120.  . omeprazole (PRILOSEC) 40 MG capsule Take 1 capsule (40 mg total) by mouth  2 (two) times daily before a meal.   No facility-administered medications prior to visit.    Review of Systems  Constitutional: Negative.   Respiratory: Negative for cough and shortness of breath.   Cardiovascular: Negative for chest pain, palpitations and leg swelling.  Gastrointestinal: Negative.   Musculoskeletal: Positive for arthralgias. Negative for myalgias.  Neurological: Positive for numbness. Negative for dizziness, light-headedness and headaches.      Objective    BP (!) 151/78   Pulse 87   Temp 98.4 F (36.9 C)   Resp 16   Ht 5\' 10"  (1.778 m)   Wt 171 lb (77.6 kg)   BMI 24.54 kg/m    Physical Exam   General: Appearance:    Well developed, well nourished male in no acute distress  Eyes:    PERRL, conjunctiva/corneas clear, EOM's intact       Lungs:     Clear to auscultation bilaterally, respirations unlabored  Heart:    Normal heart rate. Normal rhythm. No murmurs, rubs, or gallops.   MS:   All extremities are intact.   Neurologic:   Awake, alert, oriented x 3. No apparent focal neurological           defect.         Results for orders placed or performed in visit on 03/11/20  POCT glycosylated hemoglobin (Hb A1C)  Result Value Ref Range   Hemoglobin A1C 10.1 (A) 4.0 - 5.6 %   HbA1c POC (<> result, manual entry)     HbA1c, POC (prediabetic range)     HbA1c, POC (controlled diabetic range)     Est. average glucose Bld gHb Est-mCnc 243     Assessment & Plan     1. Uncontrolled type 2 diabetes mellitus with hyperglycemia ([place]) Fastings are well controlled. Continue same dose of Tresiba. However a1c is up and sugars in the 200s and 300s through the day. Will increase mealtime SS Humalog as per Patient Instructions.  - Ambulatory referral to Ophthalmology  Recheck A1c in 3 months.   2. Stage 3b chronic kidney disease ([place]) Has markedly elevated creating with last hospitalization. Need to recheck to day to make sure has returned to baseline.  - CBC -  Lipid panel - Comprehensive metabolic panel   No follow-ups on file.      The entirety of the information documented in the History of Present Illness, Review of Systems and Physical Exam were personally obtained by me. Portions of this information were initially documented by the CMA and reviewed by me for thoroughness and accuracy.      Lelon Huh, MD  [place] [facility] [phone] (phone) [phone] (fax)  [place]

## 2020-03-15 ENCOUNTER — Encounter: Payer: Self-pay | Admitting: Emergency Medicine

## 2020-03-15 ENCOUNTER — Emergency Department
Admission: EM | Admit: 2020-03-15 | Discharge: 2020-03-15 | Disposition: A | Discharge status: Home / Self Care | Payer: Medicare HMO | Attending: Student in an Organized Health Care Education/Training Program | Admitting: Student in an Organized Health Care Education/Training Program

## 2020-03-15 ENCOUNTER — Other Ambulatory Visit: Payer: Self-pay

## 2020-03-15 ENCOUNTER — Emergency Department: Payer: Medicare HMO

## 2020-03-15 DIAGNOSIS — Z7689 Persons encountering health services in other specified circumstances: Secondary | ICD-10-CM | POA: Diagnosis not present

## 2020-03-15 DIAGNOSIS — M19022 Primary osteoarthritis, left elbow: Secondary | ICD-10-CM | POA: Diagnosis not present

## 2020-03-15 DIAGNOSIS — E1040 Type 1 diabetes mellitus with diabetic neuropathy, unspecified: Secondary | ICD-10-CM | POA: Diagnosis not present

## 2020-03-15 DIAGNOSIS — M7989 Other specified soft tissue disorders: Secondary | ICD-10-CM | POA: Diagnosis not present

## 2020-03-15 DIAGNOSIS — I129 Hypertensive chronic kidney disease with stage 1 through stage 4 chronic kidney disease, or unspecified chronic kidney disease: Secondary | ICD-10-CM | POA: Insufficient documentation

## 2020-03-15 DIAGNOSIS — Z7982 Long term (current) use of aspirin: Secondary | ICD-10-CM | POA: Insufficient documentation

## 2020-03-15 DIAGNOSIS — Z79899 Other long term (current) drug therapy: Secondary | ICD-10-CM | POA: Insufficient documentation

## 2020-03-15 DIAGNOSIS — E7800 Pure hypercholesterolemia, unspecified: Secondary | ICD-10-CM | POA: Diagnosis not present

## 2020-03-15 DIAGNOSIS — N1831 Chronic kidney disease, stage 3a: Secondary | ICD-10-CM | POA: Insufficient documentation

## 2020-03-15 DIAGNOSIS — M25522 Pain in left elbow: Secondary | ICD-10-CM | POA: Insufficient documentation

## 2020-03-15 DIAGNOSIS — E1022 Type 1 diabetes mellitus with diabetic chronic kidney disease: Secondary | ICD-10-CM | POA: Insufficient documentation

## 2020-03-15 DIAGNOSIS — N1832 Chronic kidney disease, stage 3b: Secondary | ICD-10-CM | POA: Diagnosis not present

## 2020-03-15 DIAGNOSIS — R52 Pain, unspecified: Secondary | ICD-10-CM

## 2020-03-15 IMAGING — DX DG ELBOW COMPLETE 3+V*L*
4 series · 4 of 4 positions shown · non-contrast
Comparison: None.

CLINICAL DATA: No known injury, anterior pain for 2 days

EXAM:
LEFT ELBOW - COMPLETE 3+ VIEW

[elbow ap]
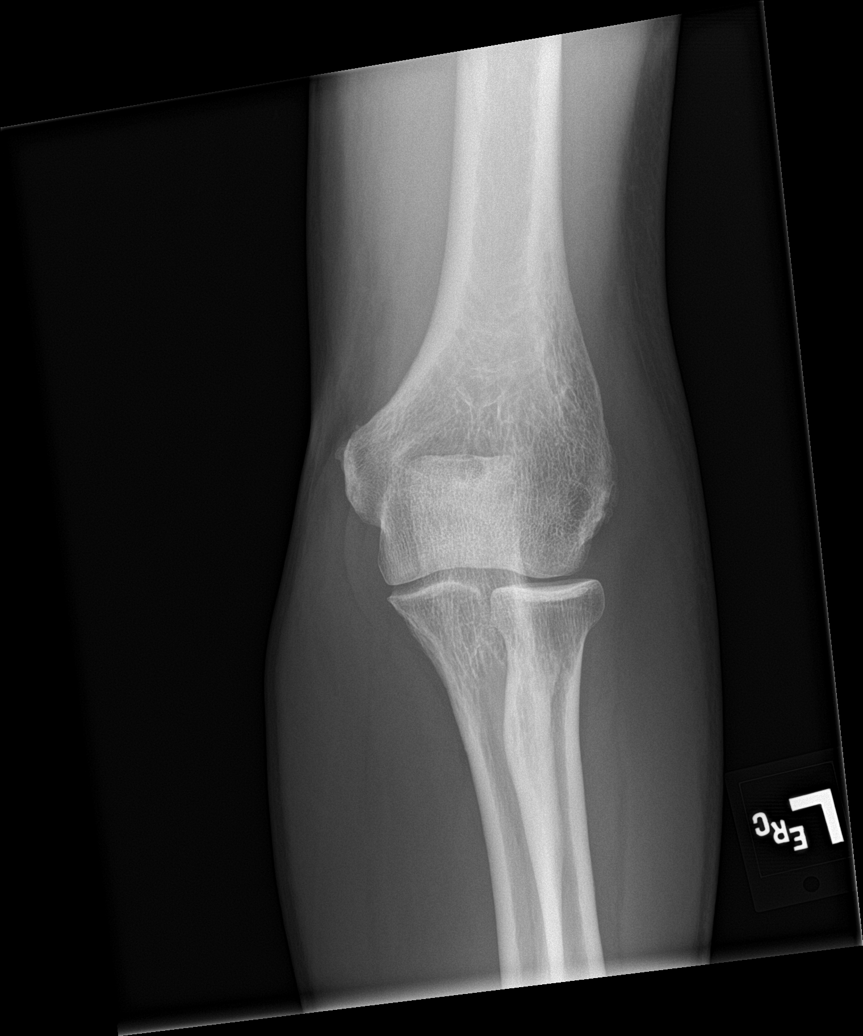

[elbow obl (1 of 2)]
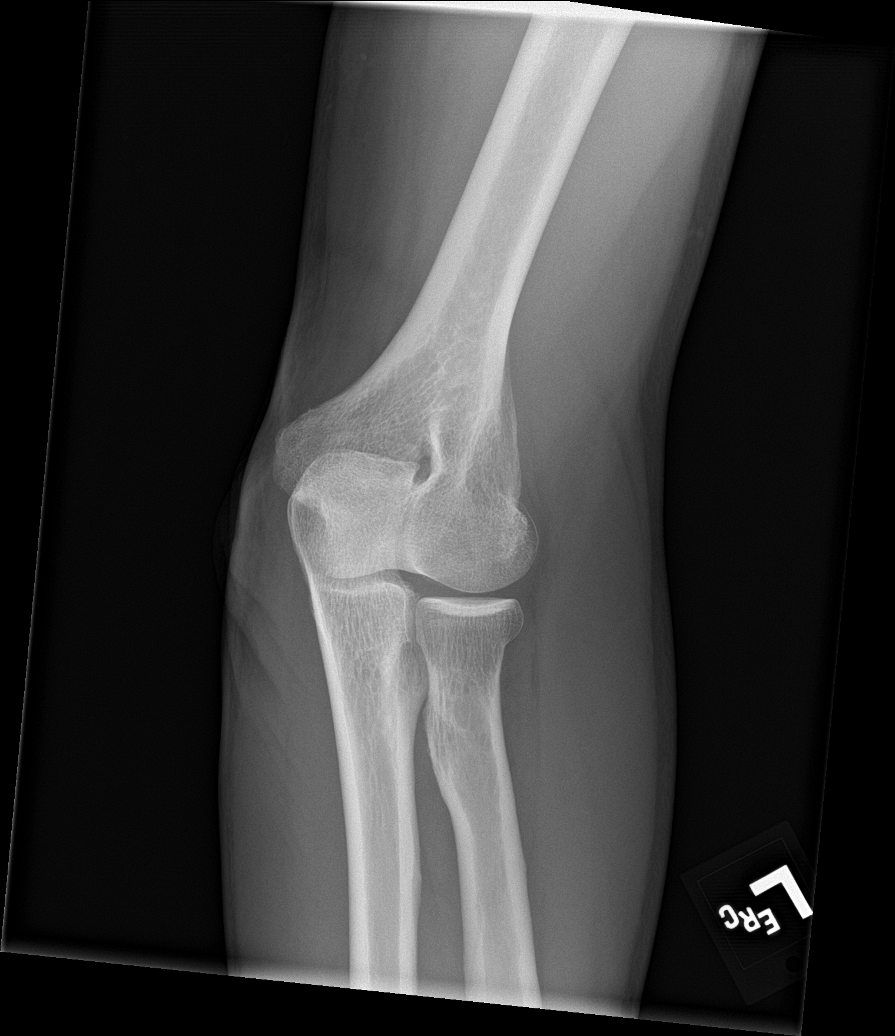

[elbow obl (2 of 2)]
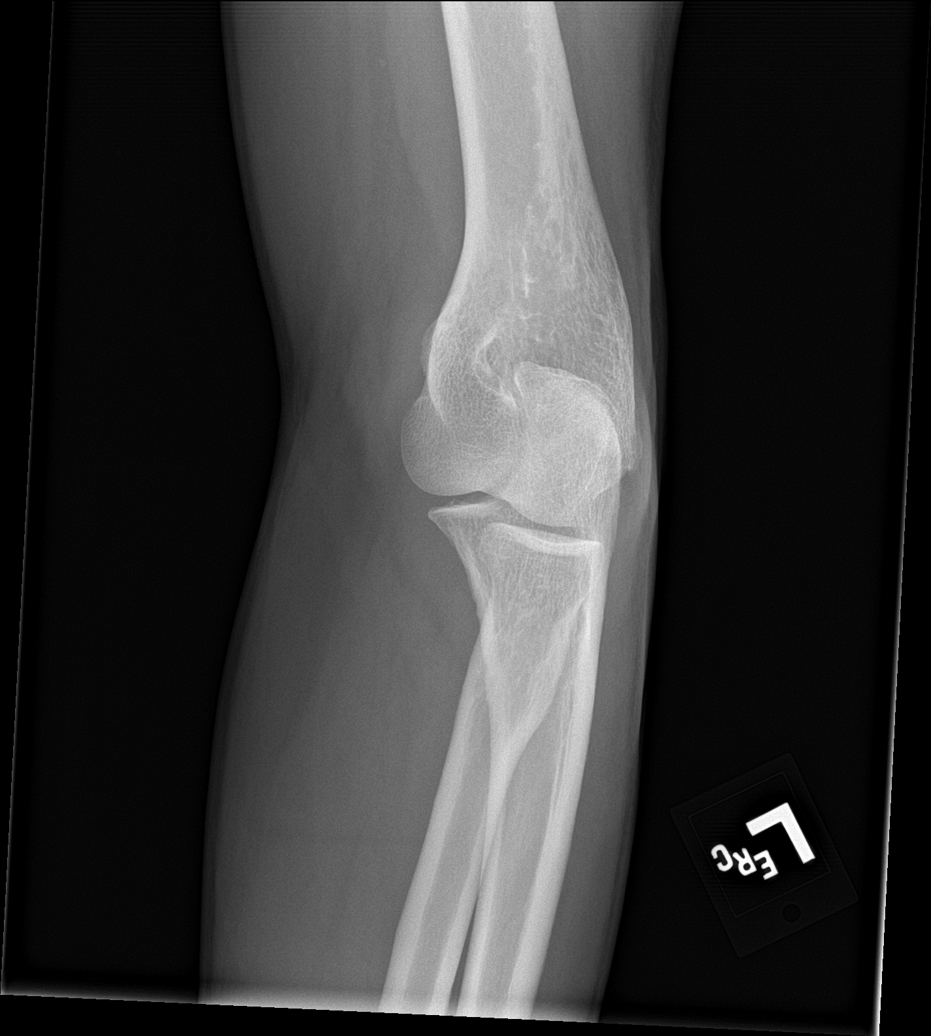

[elbow lat]
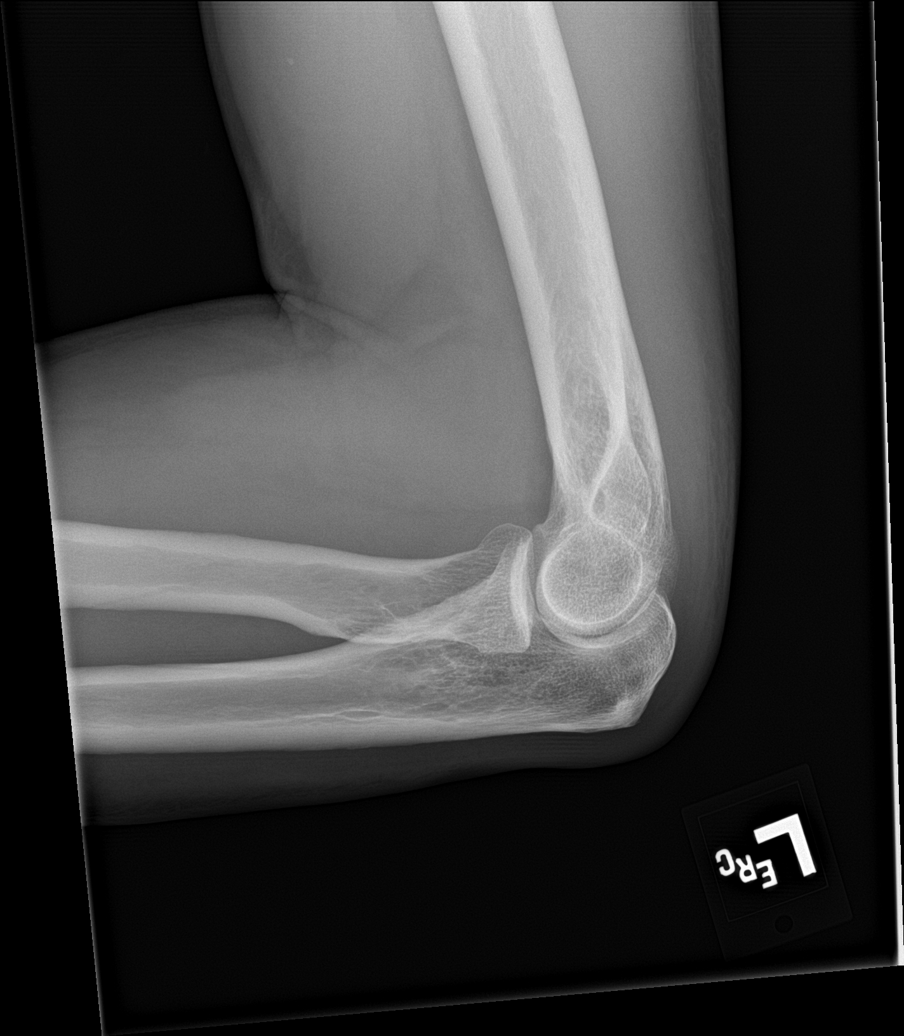

[4 of 4 positions shown; findings below may reference images not displayed]

FINDINGS: Some questionable irregularity along the articular surface of the
coronoid process seen on the oblique view. Possibly degenerative
though could correlate for point tenderness to exclude an acute
injury which is less favored given the absence of trauma. Mild soft
tissue swelling of the elbow is present. No large effusion. No other
sites concerning for fracture or other acute osseous abnormality.
Mild degenerative enthesopathic spurring along the medial and
lateral epicondyles of the humerus.
IMPRESSION: 1. Some questionable irregularity along the articular surface of the
coronoid process seen on the oblique view. Favor degenerative though
could correlate for point tenderness to exclude an acute injury
which is less favored given the absence of trauma or effusion.
2. Mild soft tissue swelling of the elbow.
3. Additional mild degenerative changes of the elbow with
enthesopathic changes of the medial and lateral epicondyles.

## 2020-03-15 MED ORDER — MELOXICAM 7.5 MG PO TABS: 7.5000 mg | ORAL_TABLET | Freq: Every day | ORAL | 0 refills | Status: AC

## 2020-03-15 NOTE — ED Triage Notes (Signed)
Pt reports left forearm pain since yesterday morning denies any injury. Pt talks in complete sentences no distress noted

## 2020-03-15 NOTE — Discharge Instructions (Addendum)
You have been prescribed Mobic. Please take this once daily with food. You may also take Tylenol 1000 mg 4x daily as needed. Follow up with Orthopedics early  next week.

## 2020-03-15 NOTE — ED Provider Notes (Signed)
[facility] Emergency Department Provider Note  ____________________________________________   Event Date/Time   First MD Initiated Contact with Patient 03/15/20 1918     (approximate)  I have reviewed the triage vital signs and the nursing notes.   HISTORY  Chief Complaint Arm Pain  HPI Nicholas Weiss is a 69 y.o. male who presents to the emergency department for evaluation of left elbow pain.  Patient states that he awoke with the pain yesterday morning.  He does not remember any specific fall, trauma or injury.  He states he has never had any pain like this before.  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  Pain is rated an 8/10.  He has not tried any alleviating factors to this time.         Past Medical History:  Diagnosis Date  . DKA (diabetic ketoacidoses) 04/06/2016  . Hypercholesteremia   . Hypertension     Patient Active Problem List   Diagnosis Date Noted  . PUD (peptic ulcer disease)   . Esophageal dysphagia   . Encounter for screening colonoscopy   . Hyperglycemia due to type 2 diabetes mellitus ([place]) 07/28/2019  . Generalized weakness 07/28/2019  . Type 2 diabetes mellitus with hyperglycemia ([place]) 07/28/2019  . Hypoglycemia 04/27/2019  . Unresponsiveness 04/27/2019  . Lung nodule 04/07/2019  . Uncontrolled type 1 diabetes mellitus ([place])   . Acute renal failure with acute tubular necrosis superimposed on stage 3a chronic kidney disease ([place])   . Hypertensive urgency 04/05/2019  . AKI (acute kidney injury) ([place]) 04/05/2019  . CKD (chronic kidney disease) stage 3, GFR 30-59 ml/min ([facility]) 04/05/2019  . DKA (diabetic ketoacidoses) 04/06/2016  . Hypercholesterolemia 01/24/2016  . Essential (primary) hypertension 06/07/2006  . Type 1 diabetes mellitus with diabetic neuropathy, unspecified ([place]) 05/31/2006    Past Surgical History:  Procedure Laterality Date  . COLONOSCOPY WITH PROPOFOL N/A 02/01/2020   Procedure:  COLONOSCOPY WITH PROPOFOL;  Surgeon: Lin Landsman, MD;  Location: [facility] ENDOSCOPY;  Service: Gastroenterology;  Laterality: N/A;  . ESOPHAGOGASTRODUODENOSCOPY  02/01/2020   Procedure: ESOPHAGOGASTRODUODENOSCOPY (EGD);  Surgeon: Lin Landsman, MD;  Location: [facility] ENDOSCOPY;  Service: Gastroenterology;;  . KNEE SURGERY Right    Torn meniscus  . KNEE SURGERY Left     Prior to Admission medications   Medication Sig Start Date End Date Taking? Authorizing Provider  amLODipine (NORVASC) 10 MG tablet Take 1 tablet (10 mg total) by mouth daily. 09/18/19   Birdie Sons, MD  aspirin 81 MG chewable tablet Chew 1 tablet by mouth daily.    [provider]  insulin aspart (NOVOLOG) 100 UNIT/ML injection Inject up to 14 units three times daily before meals according to sliding scale 12/26/19   Birdie Sons, MD  lovastatin (MEVACOR) 40 MG tablet Take 1 tablet (40 mg total) by mouth at bedtime. 08/02/18   Birdie Sons, MD  meloxicam (MOBIC) 7.5 MG tablet Take 1 tablet (7.5 mg total) by mouth daily for 10 days. 03/15/20 03/25/20  Marlana Salvage, PA  omeprazole (PRILOSEC) 40 MG capsule Take 1 capsule (40 mg total) by mouth 2 (two) times daily before a meal. 02/01/20 03/02/20  Vanga, Tally Due, MD  pregabalin (LYRICA) 75 MG capsule Take 1 capsule (75 mg total) by mouth 2 (two) times daily. 12/25/19   Birdie Sons, MD  TRESIBA FLEXTOUCH 100 UNIT/ML FlexTouch Pen INJECT 30 UNITS SUBCUTANEOUSLY ONCE DAILY. INCREASE AS DIRECTED TO KEEP  FASTING GLUCOSE LESS THAN 120. Patient taking differently: 28 Units daily. 02/23/20   Birdie Sons, MD    Allergies Penicillins  Family History  Problem Relation Age of Onset  . Heart attack Father   . Hypertension Sister   . Cancer Sister     Social History Social History   Tobacco Use  . Smoking status: Never Smoker  . Smokeless tobacco: Never Used  Vaping Use  . Vaping Use: Never used  Substance Use Topics  . Alcohol use: Yes     Alcohol/week: 0.0 - 1.0 standard drinks  . Drug use: Yes    Types: Marijuana    Review of Systems Constitutional: No fever/chills Eyes: No visual changes. ENT: No sore throat. Cardiovascular: Denies chest pain. Respiratory: Denies shortness of breath. Gastrointestinal: No abdominal pain.  No nausea, no vomiting.  No diarrhea.  No constipation. Genitourinary: Negative for dysuria. Musculoskeletal: + Right elbow pain, negative for back pain. Skin: Negative for rash. Neurological: Negative for headaches, focal weakness or numbness.   ____________________________________________   PHYSICAL EXAM:  VITAL SIGNS: ED Triage Vitals  Enc Vitals Group     BP 03/15/20 1910 (!) 160/85     Pulse Rate 03/15/20 1910 94     Resp 03/15/20 1910 20     Temp 03/15/20 1910 98.9 F (37.2 C)     Temp Source 03/15/20 1910 Oral     SpO2 03/15/20 1910 100 %     Weight 03/15/20 1911 171 lb (77.6 kg)     Height 03/15/20 1911 5\' 10"  (1.778 m)     Head Circumference --      Peak Flow --      Pain Score 03/15/20 1911 8     Pain Loc --      Pain Edu? --      Excl. in [place]? --    Constitutional: Alert and oriented. Well appearing and in no acute distress. Eyes: Conjunctivae are normal. PERRL. EOMI. Head: Atraumatic. Nose: No congestion/rhinnorhea. Mouth/Throat: Mucous membranes are moist.  Oropharynx non-erythematous. Neck: No stridor.   Cardiovascular: Normal rate, regular rhythm.  Good peripheral circulation. Respiratory: Normal respiratory effort.  No retractions. Musculoskeletal: There is tenderness to palpation of the anterior aspect of the left elbow joint, most prominent on the lateral side.  Patient has full range of motion without pain of the left elbow.  Patient has 5/5 flexion and extension without increase in pain. Neurologic:  Normal speech and language. No gross focal neurologic deficits are appreciated. No gait instability. Skin:  Skin is warm, dry and intact. No rash  noted. Psychiatric: Mood and affect are normal. Speech and behavior are normal.   ____________________________________________  RADIOLOGY  ED provider interpretation: Radiologist report was available for review by me, however images were unable to cross over in the directly visualized by myself during this patient's time in the emergency department  Official radiology report(s): DG ELBOW COMPLETE LEFT (3+VIEW)  Result Date: 03/15/2020 CLINICAL DATA:  No known injury, anterior pain for 2 days EXAM: LEFT ELBOW - COMPLETE 3+ VIEW COMPARISON:  None. FINDINGS: Some questionable irregularity along the articular surface of the coronoid process seen on the oblique view. Possibly degenerative though could correlate for point tenderness to exclude an acute injury which is less favored given the absence of trauma. Mild soft tissue swelling of the elbow is present. No large effusion. No other sites concerning for fracture or other acute osseous abnormality. Mild degenerative enthesopathic spurring along the medial and lateral epicondyles  of the humerus. IMPRESSION: 1. Some questionable irregularity along the articular surface of the coronoid process seen on the oblique view. Favor degenerative though could correlate for point tenderness to exclude an acute injury which is less favored given the absence of trauma or effusion. 2. Mild soft tissue swelling of the elbow. 3. Additional mild degenerative changes of the elbow with enthesopathic changes of the medial and lateral epicondyles. Electronically Signed   By: Lovena Le M.D.   On: 03/15/2020 19:50    ____________________________________________   INITIAL IMPRESSION / ASSESSMENT AND PLAN / ED COURSE  As part of my medical decision making, I reviewed the following data within the [place] notes reviewed and incorporated        Patient is a 69 year old male presents to the emergency department for evaluation of left elbow  pain.  He did not sustain any known trauma or injury, see HPI for further details.  On physical exam, the patient does have exquisite tenderness to palpation of the anterior aspect of the left elbow, but maintains full range of motion and strength without pain.  There is radiologist question about irregularity along the articular surface of the coronoid process which could be seen as degenerative or acute injury.  Unfortunately, the images are not able to be reviewed for clinical correlation by myself at this time.  Given this, the patient was placed in a posterior slab splint and sling with follow-up with Ortho in a few days to protect the joint in the event of acute fracture.  This was discussed with the patient, and discussed that it was unclear whether this was arthritic change versus acute fracture, and he is amenable with plan to splint and follow-up with Ortho.  We will treat his pain with meloxicam and over-the-counter Tylenol.  Patient is stable for outpatient therapy at this time.      ____________________________________________   FINAL CLINICAL IMPRESSION(S) / ED DIAGNOSES  Final diagnoses:  Left elbow pain     ED Discharge Orders         Ordered    meloxicam (MOBIC) 7.5 MG tablet  Daily        03/15/20 2019          *Please note:  Nicholas Weiss was evaluated in Emergency Department on 03/15/2020 for the symptoms described in the history of present illness. He was evaluated in the context of the global COVID-19 pandemic, which necessitated consideration that the patient might be at risk for infection with the SARS-CoV-2 virus that causes COVID-19. Institutional protocols and algorithms that pertain to the evaluation of patients at risk for COVID-19 are in a state of rapid change based on information released by regulatory bodies including the CDC and federal and state organizations. These policies and algorithms were followed during the patient's care in the ED.  Some ED  evaluations and interventions may be delayed as a result of limited staffing during and the pandemic.*   Note:  This document was prepared using Dragon voice recognition software and may include unintentional dictation errors.     ]   Marlana Salvage, PA 03/15/20 2333    Merlyn Lot, MD 03/15/20 2348

## 2020-03-16 LAB — CBC
Hematocrit: 30.4 % — ABNORMAL LOW (ref 37.5–51.0)
Hemoglobin: 10 g/dL — ABNORMAL LOW (ref 13.0–17.7)
MCH: 30.6 pg (ref 26.6–33.0)
MCHC: 32.9 g/dL (ref 31.5–35.7)
MCV: 93 fL (ref 79–97)
Platelets: 205 10*3/uL (ref 150–450)
RBC: 3.27 x10E6/uL — ABNORMAL LOW (ref 4.14–5.80)
RDW: 13.1 % (ref 11.6–15.4)
WBC: 4.4 10*3/uL (ref 3.4–10.8)

## 2020-03-16 LAB — COMPREHENSIVE METABOLIC PANEL
ALT: 20 IU/L (ref 0–44)
AST: 20 IU/L (ref 0–40)
Albumin/Globulin Ratio: 0.7 — ABNORMAL LOW (ref 1.2–2.2)
Albumin: 3.5 g/dL — ABNORMAL LOW (ref 3.8–4.8)
Alkaline Phosphatase: 63 IU/L (ref 44–121)
BUN/Creatinine Ratio: 17 (ref 10–24)
BUN: 26 mg/dL (ref 8–27)
Bilirubin Total: <0.2 mg/dL (ref 0.0–1.2)
CO2: 20 mmol/L (ref 20–29)
Calcium: 8.5 mg/dL — ABNORMAL LOW (ref 8.6–10.2)
Chloride: 101 mmol/L (ref 96–106)
Creatinine, Ser: 1.53 mg/dL — ABNORMAL HIGH (ref 0.76–1.27)
GFR calc Af Amer: 54 mL/min/{1.73_m2} — ABNORMAL LOW (ref >59)
GFR calc non Af Amer: 47 mL/min/{1.73_m2} — ABNORMAL LOW (ref >59)
Globulin, Total: 4.8 g/dL — ABNORMAL HIGH (ref 1.5–4.5)
Glucose: 301 mg/dL — ABNORMAL HIGH (ref 65–99)
Potassium: 5.3 mmol/L — ABNORMAL HIGH (ref 3.5–5.2)
Sodium: 135 mmol/L (ref 134–144)
Total Protein: 8.3 g/dL (ref 6.0–8.5)

## 2020-03-16 LAB — LIPID PANEL
Chol/HDL Ratio: 2.4 ratio (ref 0.0–5.0)
Cholesterol, Total: 118 mg/dL (ref 100–199)
HDL: 49 mg/dL (ref >39)
LDL Chol Calc (NIH): 53 mg/dL (ref 0–99)
Triglycerides: 80 mg/dL (ref 0–149)
VLDL Cholesterol Cal: 16 mg/dL (ref 5–40)

## 2020-03-20 ENCOUNTER — Emergency Department
Admission: EM | Admit: 2020-03-20 | Discharge: 2020-03-20 | Disposition: A | Discharge status: Home / Self Care | Payer: Medicare HMO | Attending: Emergency Medicine | Admitting: Emergency Medicine

## 2020-03-20 ENCOUNTER — Emergency Department: Payer: Medicare HMO

## 2020-03-20 ENCOUNTER — Other Ambulatory Visit: Payer: Self-pay

## 2020-03-20 DIAGNOSIS — E1165 Type 2 diabetes mellitus with hyperglycemia: Secondary | ICD-10-CM | POA: Insufficient documentation

## 2020-03-20 DIAGNOSIS — Z79899 Other long term (current) drug therapy: Secondary | ICD-10-CM | POA: Diagnosis not present

## 2020-03-20 DIAGNOSIS — M25522 Pain in left elbow: Secondary | ICD-10-CM | POA: Insufficient documentation

## 2020-03-20 DIAGNOSIS — E7800 Pure hypercholesterolemia, unspecified: Secondary | ICD-10-CM | POA: Insufficient documentation

## 2020-03-20 DIAGNOSIS — I129 Hypertensive chronic kidney disease with stage 1 through stage 4 chronic kidney disease, or unspecified chronic kidney disease: Secondary | ICD-10-CM | POA: Diagnosis not present

## 2020-03-20 DIAGNOSIS — M79602 Pain in left arm: Secondary | ICD-10-CM | POA: Insufficient documentation

## 2020-03-20 DIAGNOSIS — E1110 Type 2 diabetes mellitus with ketoacidosis without coma: Secondary | ICD-10-CM | POA: Diagnosis not present

## 2020-03-20 DIAGNOSIS — Z7982 Long term (current) use of aspirin: Secondary | ICD-10-CM | POA: Diagnosis not present

## 2020-03-20 DIAGNOSIS — E1169 Type 2 diabetes mellitus with other specified complication: Secondary | ICD-10-CM | POA: Diagnosis not present

## 2020-03-20 DIAGNOSIS — N1832 Chronic kidney disease, stage 3b: Secondary | ICD-10-CM | POA: Diagnosis not present

## 2020-03-20 DIAGNOSIS — E1122 Type 2 diabetes mellitus with diabetic chronic kidney disease: Secondary | ICD-10-CM | POA: Insufficient documentation

## 2020-03-20 DIAGNOSIS — Z794 Long term (current) use of insulin: Secondary | ICD-10-CM | POA: Diagnosis not present

## 2020-03-20 LAB — PROTEIN ELECTROPHORESIS
A/G Ratio: 0.8 (ref 0.7–1.7)
Albumin ELP: 3.6 g/dL (ref 2.9–4.4)
Alpha 1: 0.2 g/dL (ref 0.0–0.4)
Alpha 2: 0.6 g/dL (ref 0.4–1.0)
Beta: 0.6 g/dL — ABNORMAL LOW (ref 0.7–1.3)
Gamma Globulin: 3.2 g/dL — ABNORMAL HIGH (ref 0.4–1.8)
Globulin, Total: 4.5 g/dL — ABNORMAL HIGH (ref 2.2–3.9)
M-Spike, %: 3 g/dL — ABNORMAL HIGH
Total Protein: 8.1 g/dL (ref 6.0–8.5)

## 2020-03-20 LAB — SPECIMEN STATUS REPORT

## 2020-03-20 IMAGING — DX DG ELBOW COMPLETE 3+V*L*
4 series · 4 of 4 positions shown · non-contrast
Comparison: [DATE]

CLINICAL DATA: Pain

EXAM:
LEFT ELBOW - COMPLETE 3+ VIEW

[elbow ap]
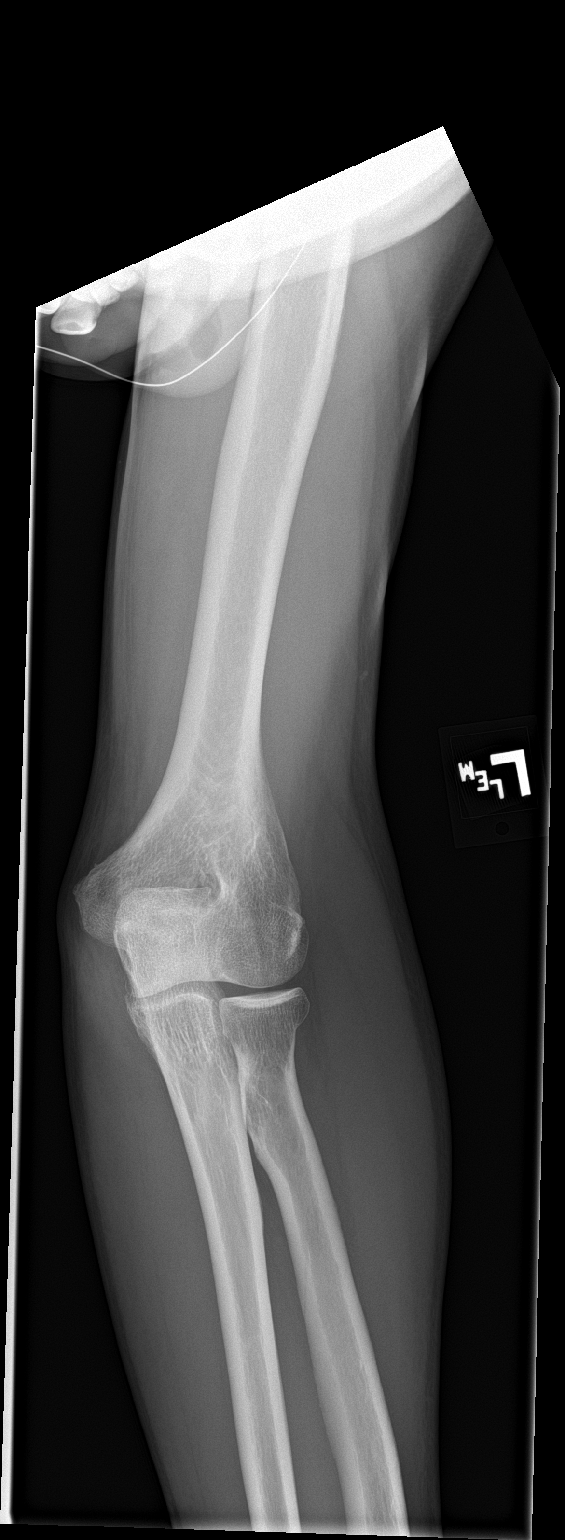

[elbow obl (1 of 2)]
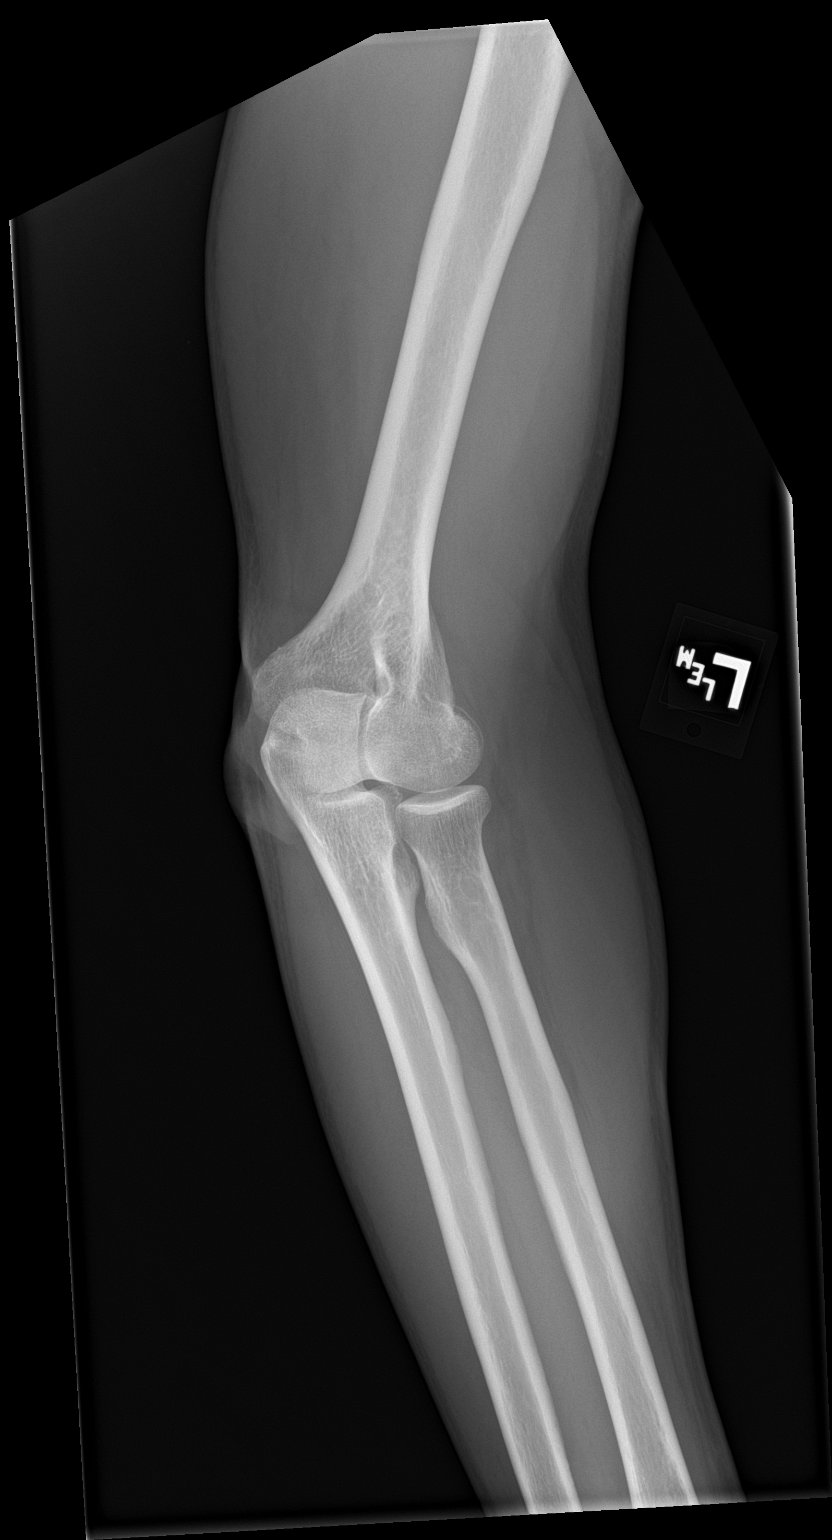

[elbow obl (2 of 2)]
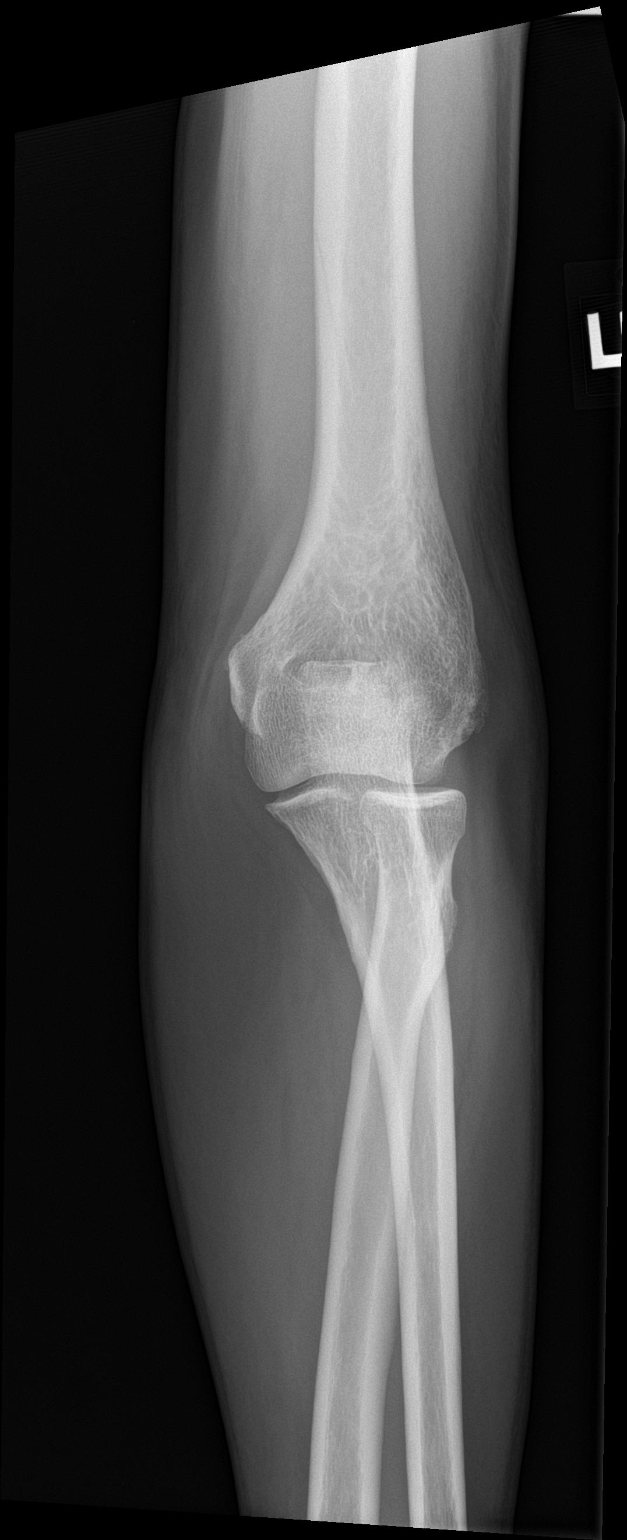

[elbow lat]
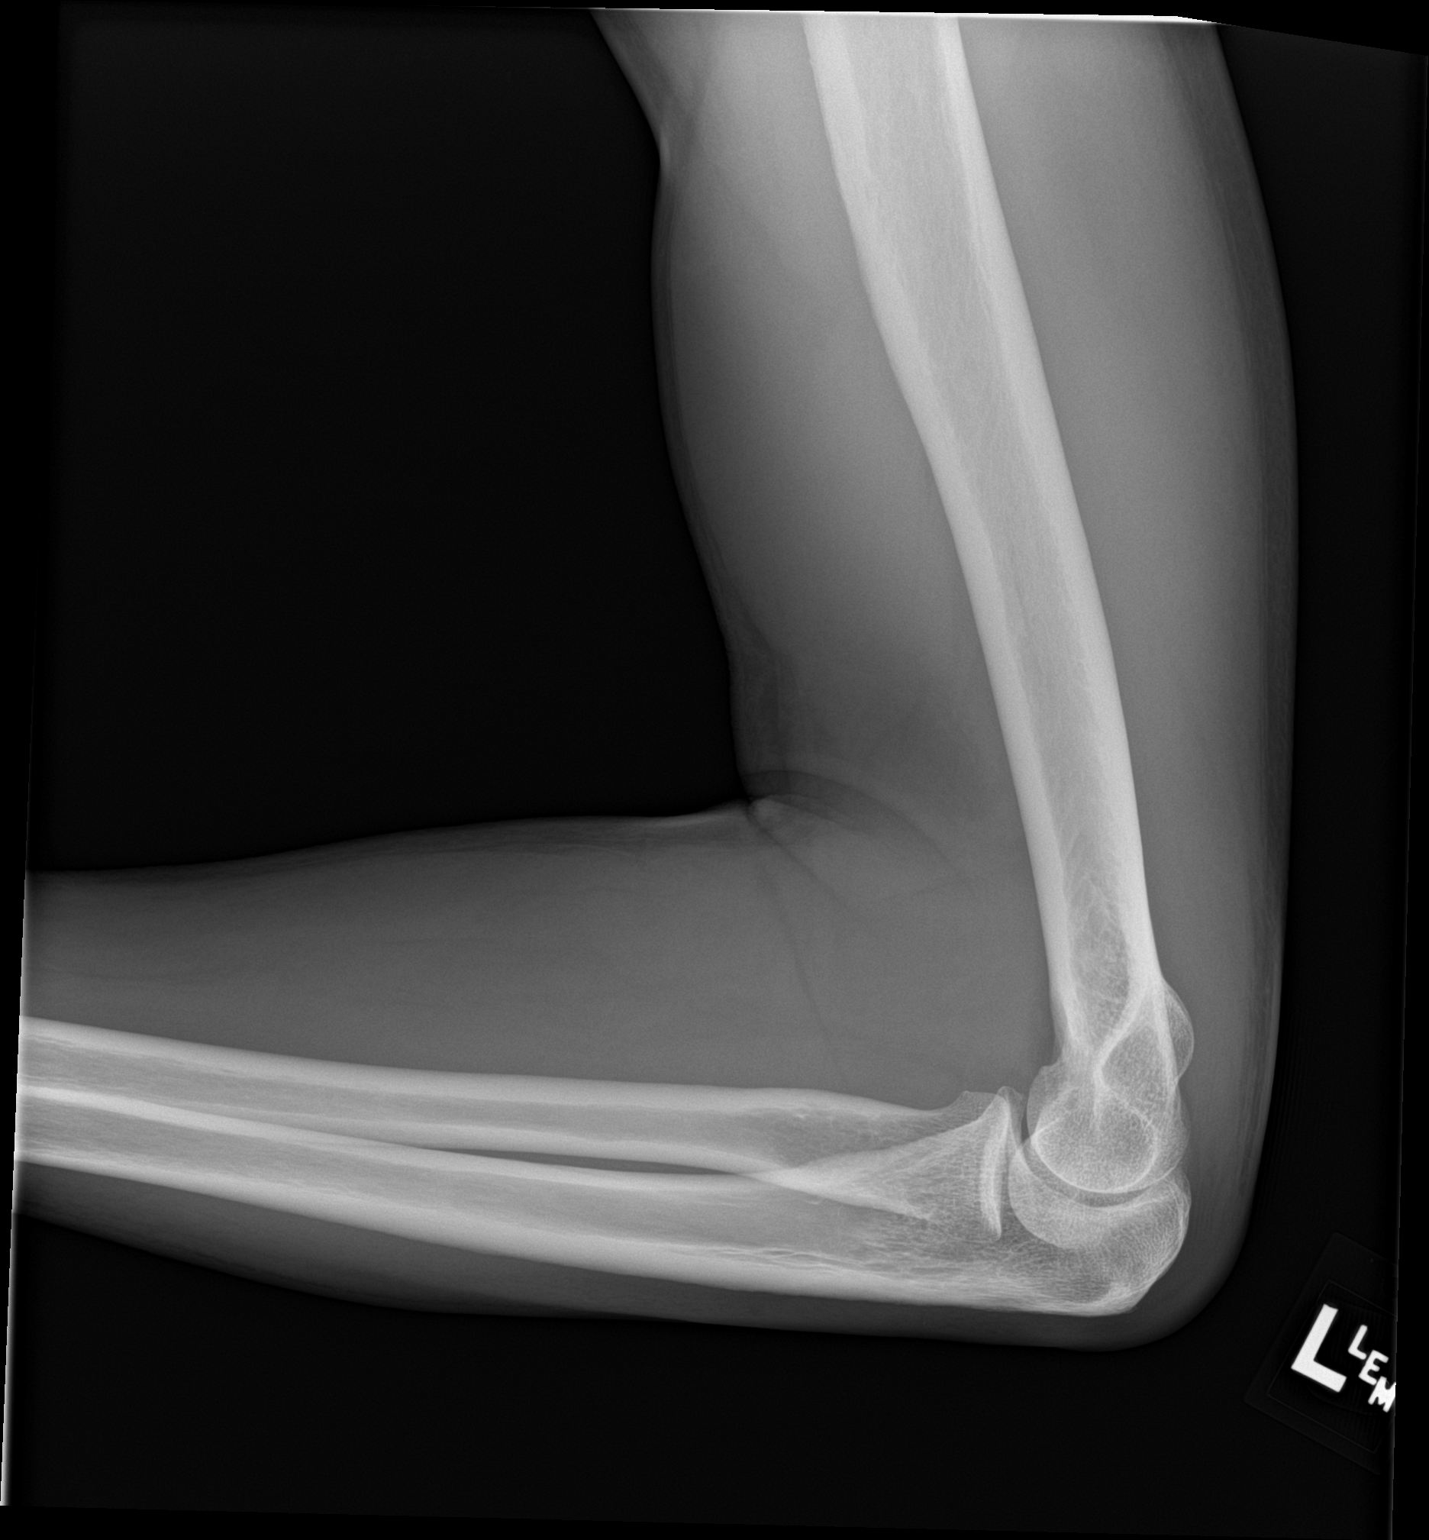

[4 of 4 positions shown; findings below may reference images not displayed]

FINDINGS: Frontal, lateral, and bilateral oblique views were obtained. No
evident fracture or dislocation. No joint effusion. Joint spaces
appear normal. No erosive change.
IMPRESSION: No fracture or dislocation.  No evident arthropathic change.

## 2020-03-20 IMAGING — US US EXTREM  UP VENOUS*L*
1 series · 13 of 24 positions shown · non-contrast
Comparison: None.

CLINICAL DATA: 68-year-old with left arm pain for 3 days.



[Series 1: us venous img upper uni left (dvt) · portal-venous · 13 of 48 slices shown]
[im 1/48]
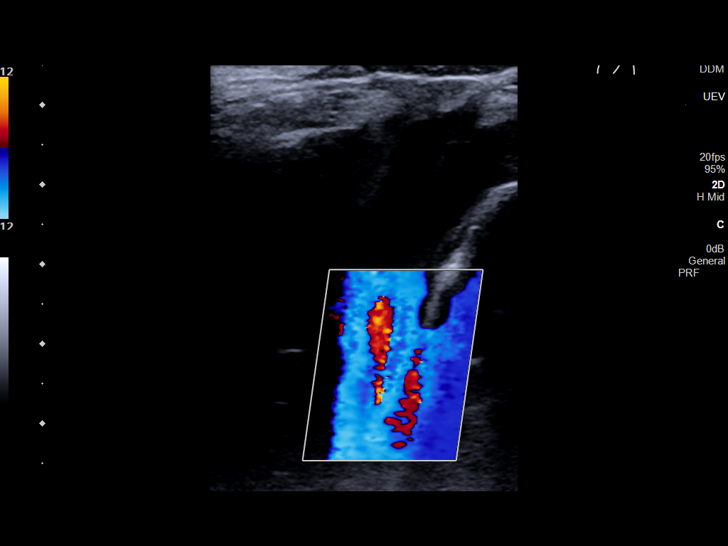
[im 5/48]
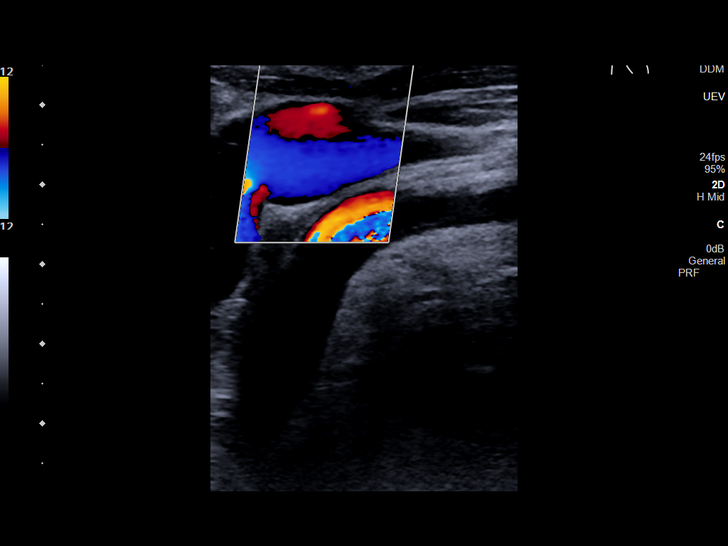
[im 9/48]
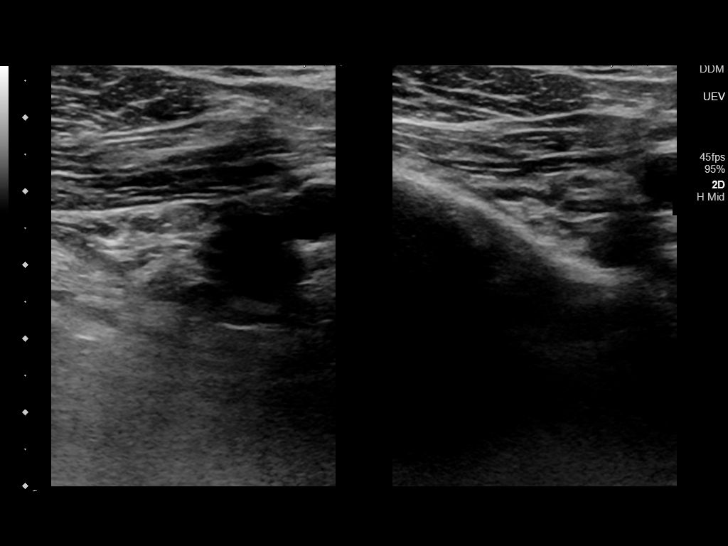
[im 13/48]
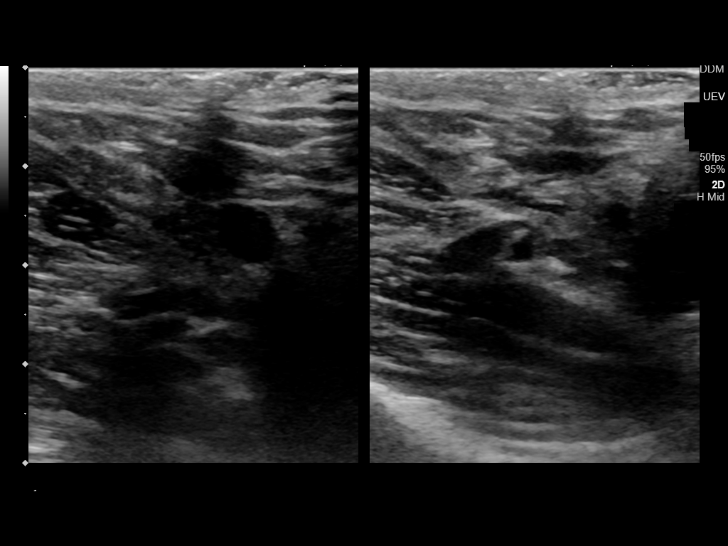
[im 17/48]
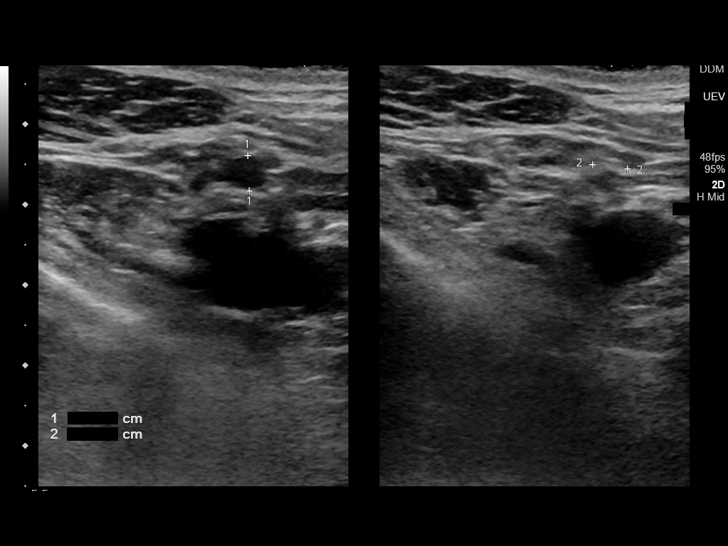
[im 21/48]
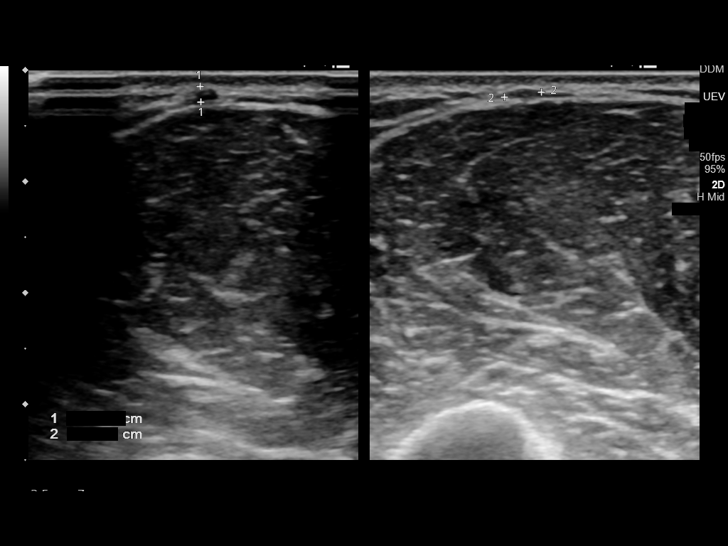
[im 25/48]
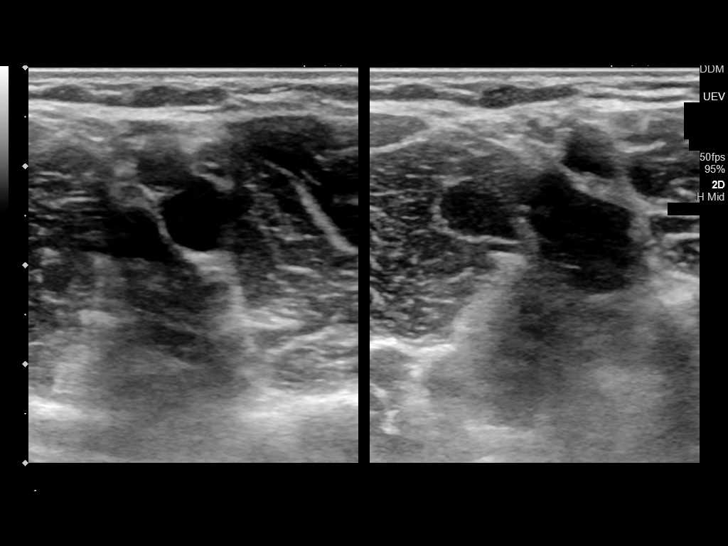
[im 27/48]
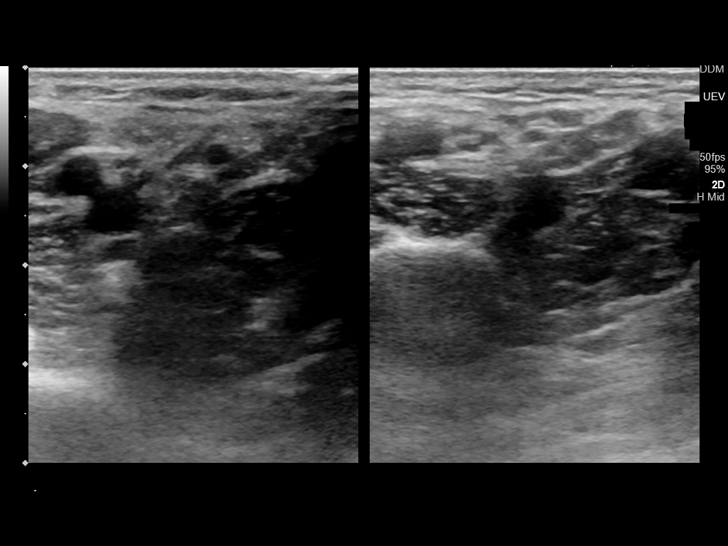
[im 31/48]
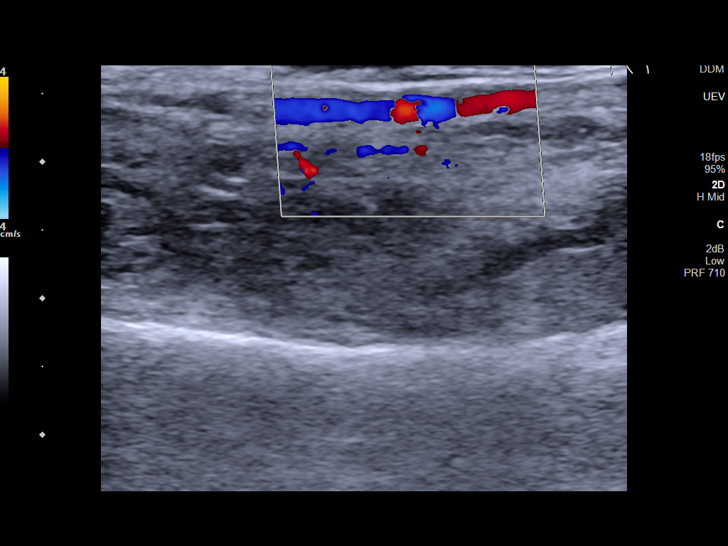
[im 35/48]
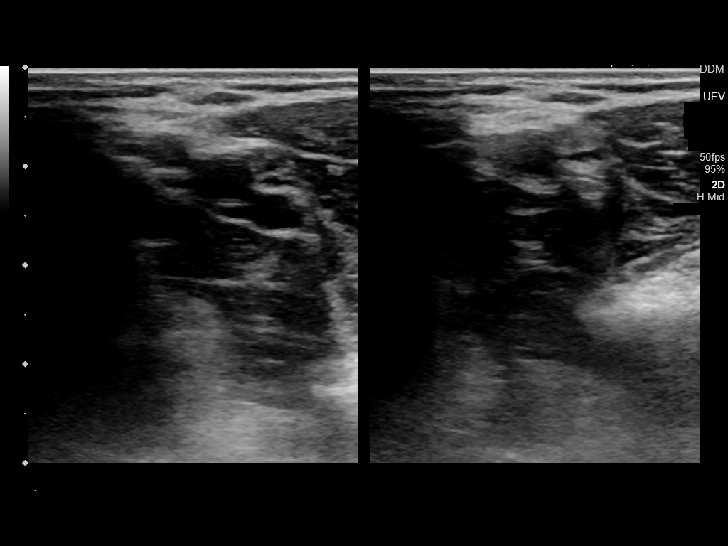
[im 39/48]
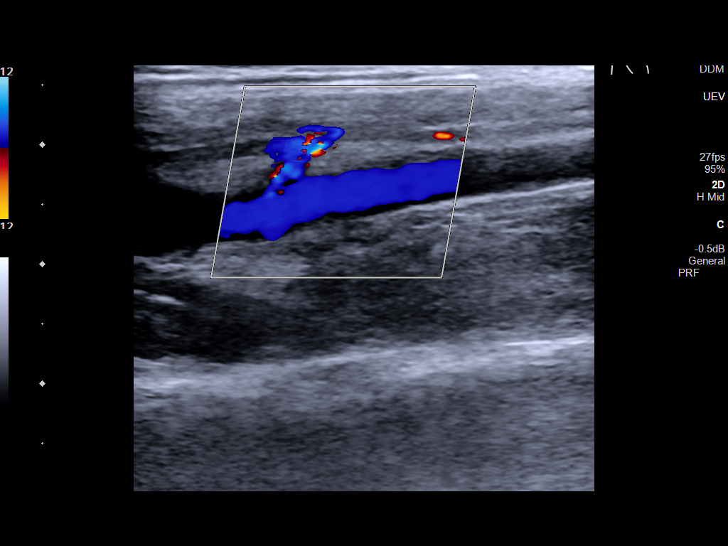
[im 43/48]
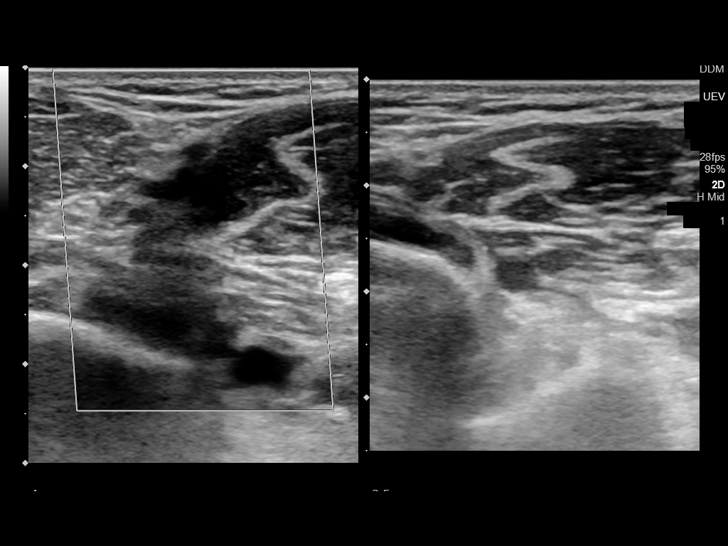
[im 48/48]
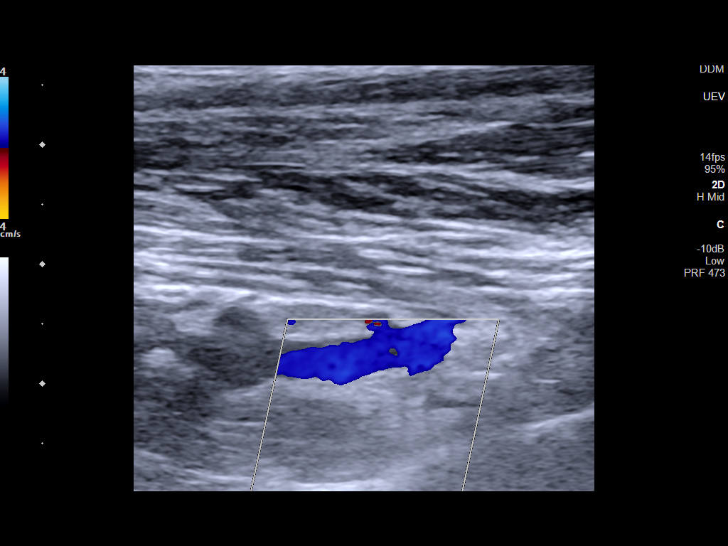

[13 of 24 positions shown; findings below may reference images not displayed]

FINDINGS: Contralateral Subclavian Vein: Normal color Doppler flow and
phasicity.

Internal Jugular Vein: No evidence of thrombus. Normal
compressibility, color Doppler flow and phasicity.

Subclavian Vein: No evidence of thrombus. Normal compressibility,
color Doppler flow and phasicity.

Axillary Vein: No evidence of thrombus. Normal compressibility,
color Doppler flow and augmentation.

Cephalic Vein: No evidence of thrombus. Normal compressibility,
color Doppler flow and augmentation.

Basilic Vein: No evidence of thrombus. Normal compressibility, color
Doppler flow and augmentation.

Brachial Veins: No evidence of thrombus. Normal compressibility,
color flow and augmentation.

Radial Veins: No evidence of thrombus. Normal compressibility and
color Doppler flow.

Ulnar Veins: No evidence of thrombus. Normal compressibility and
color Doppler flow.

Other Findings:  None visualized.
IMPRESSION: No evidence of DVT within the left upper extremity.

## 2020-03-20 MED ORDER — METHOCARBAMOL 500 MG PO TABS: 500.0000 mg | ORAL_TABLET | Freq: Three times a day (TID) | ORAL | 0 refills | Status: DC

## 2020-03-20 NOTE — ED Triage Notes (Addendum)
Reports left arm pain since Monday; was seen in ED for same. States no better. Reports pain is sharp and starts in forearm into bicep. Pt alert and oriented X4, cooperative, RR even and unlabored, color WNL. Pt in NAD.

## 2020-03-20 NOTE — ED Notes (Signed)
Pt expresses frustration with wait times

## 2020-03-20 NOTE — ED Provider Notes (Signed)
EKG: normal sinus rhythm, with 1st degree block. VR 82, PR 368, QTc 429. No acute ST elevations or depressions.   Independent EKG evaluation - see documentation/provider notes for ED course and visit details.   Duffy Bruce, MD 03/20/20 2042

## 2020-03-20 NOTE — ED Notes (Signed)
See triage note. Pt is ambulatory, alert and oriented x4. States no improvement to pain. Is able to move freely. NAD at this time, resting comfortably in bed

## 2020-03-20 NOTE — Discharge Instructions (Signed)
There is no fracture on your x-ray.  There is no blood clot on your ultrasound.  You can take the meloxicam prescription you were prescribed in the ER 2 days ago for pain and inflammation.  You can also take the Robaxin to help relax your muscles.  Please do not take the Robaxin if you are working or driving.  Please call Dr. Caryn Section in the morning for a follow-up appointment.

## 2020-03-20 NOTE — ED Provider Notes (Signed)
[facility] Emergency Department Provider Note  ____________________________________________  Time seen: Approximately 5:37 PM  I have reviewed the triage vital signs and the nursing notes.   HISTORY  Chief Complaint Arm Pain    HPI Nicholas Weiss is a 69 y.o. male that presents to the  emergency department for evaluation of left elbow pain that radiates into the left upper arm  for 4 days.  Patient states that pain starts to the inside of his elbow.  Pain shoots up his arm into his left shoulder.  Pain is worse with range of motion of left elbow and left shoulder.  Patient states that pain can lift up his arm normally and cannot hold it up but it feels worse when he lets his arm "drop" back down.  He was seen in this ER for the same 2 days ago.  A splint was placed 2 days ago for a possible small fracture.  He has removed the splint.  Pain has not improved since his last visit.  He was written a prescription for Mobic but and picked up the prescription yesterday.  He has not yet taken anything today for pain.  Patient states that no fevers, shortness of breath, chest pain, weakness, numbness, tingling.   Past Medical History:  Diagnosis Date  . DKA (diabetic ketoacidoses) 04/06/2016  . Hypercholesteremia   . Hypertension     Patient Active Problem List   Diagnosis Date Noted  . PUD (peptic ulcer disease)   . Esophageal dysphagia   . Encounter for screening colonoscopy   . Hyperglycemia due to type 2 diabetes mellitus ([place]) 07/28/2019  . Generalized weakness 07/28/2019  . Type 2 diabetes mellitus with hyperglycemia ([place]) 07/28/2019  . Hypoglycemia 04/27/2019  . Unresponsiveness 04/27/2019  . Lung nodule 04/07/2019  . Uncontrolled type 1 diabetes mellitus ([place])   . Acute renal failure with acute tubular necrosis superimposed on stage 3a chronic kidney disease ([place])   . Hypertensive urgency 04/05/2019  . AKI (acute kidney injury) ([place]) 04/05/2019  . CKD  (chronic kidney disease) stage 3, GFR 30-59 ml/min ([facility]) 04/05/2019  . DKA (diabetic ketoacidoses) 04/06/2016  . Hypercholesterolemia 01/24/2016  . Essential (primary) hypertension 06/07/2006  . Type 1 diabetes mellitus with diabetic neuropathy, unspecified ([place]) 05/31/2006    Past Surgical History:  Procedure Laterality Date  . COLONOSCOPY WITH PROPOFOL N/A 02/01/2020   Procedure: COLONOSCOPY WITH PROPOFOL;  Surgeon: Lin Landsman, MD;  Location: [facility] ENDOSCOPY;  Service: Gastroenterology;  Laterality: N/A;  . ESOPHAGOGASTRODUODENOSCOPY  02/01/2020   Procedure: ESOPHAGOGASTRODUODENOSCOPY (EGD);  Surgeon: Lin Landsman, MD;  Location: [facility] ENDOSCOPY;  Service: Gastroenterology;;  . KNEE SURGERY Right    Torn meniscus  . KNEE SURGERY Left     Prior to Admission medications   Medication Sig Start Date End Date Taking? Authorizing Provider  amLODipine (NORVASC) 10 MG tablet Take 1 tablet (10 mg total) by mouth daily. 09/18/19   Birdie Sons, MD  aspirin 81 MG chewable tablet Chew 1 tablet by mouth daily.    [provider]  insulin aspart (NOVOLOG) 100 UNIT/ML injection Inject up to 14 units three times daily before meals according to sliding scale 12/26/19   Birdie Sons, MD  lovastatin (MEVACOR) 40 MG tablet Take 1 tablet (40 mg total) by mouth at bedtime. 08/02/18   Birdie Sons, MD  meloxicam (MOBIC) 7.5 MG tablet Take 1 tablet (7.5 mg total) by mouth daily for 10 days. 03/15/20 03/25/20  Verne Carrow  J, PA  methocarbamol (ROBAXIN) 500 MG tablet Take 1 tablet (500 mg total) by mouth 3 (three) times daily. 03/20/20   Laban Emperor, PA-C  omeprazole (PRILOSEC) 40 MG capsule Take 1 capsule (40 mg total) by mouth 2 (two) times daily before a meal. 02/01/20 03/02/20  Vanga, Tally Due, MD  pregabalin (LYRICA) 75 MG capsule Take 1 capsule (75 mg total) by mouth 2 (two) times daily. 12/25/19   Birdie Sons, MD  TRESIBA FLEXTOUCH 100 UNIT/ML FlexTouch Pen  INJECT 30 UNITS SUBCUTANEOUSLY ONCE DAILY. INCREASE AS DIRECTED TO KEEP FASTING GLUCOSE LESS THAN 120. Patient taking differently: 28 Units daily. 02/23/20   Birdie Sons, MD    Allergies Penicillins  Family History  Problem Relation Age of Onset  . Heart attack Father   . Hypertension Sister   . Cancer Sister     Social History Social History   Tobacco Use  . Smoking status: Never Smoker  . Smokeless tobacco: Never Used  Vaping Use  . Vaping Use: Never used  Substance Use Topics  . Alcohol use: Yes    Alcohol/week: 0.0 - 1.0 standard drinks  . Drug use: Yes    Types: Marijuana     Review of Systems  Constitutional: No fever/chills Cardiovascular: No chest pain. Respiratory: No cough. No SOB. Gastrointestinal: No abdominal pain.  No nausea, no vomiting.  Musculoskeletal: Positive for elbow pain and arm pain.  Skin: Negative for rash, abrasions, lacerations, ecchymosis. Neurological: Negative for headaches, numbness or tingling   ____________________________________________   PHYSICAL EXAM:  VITAL SIGNS: ED Triage Vitals  Enc Vitals Group     BP 03/20/20 1248 (!) 156/84     Pulse Rate 03/20/20 1248 92     Resp 03/20/20 1248 18     Temp 03/20/20 1248 98.7 F (37.1 C)     Temp Source 03/20/20 1248 Oral     SpO2 03/20/20 1248 100 %     Weight 03/20/20 1249 169 lb 12.1 oz (77 kg)     Height 03/20/20 1249 5\' 10"  (1.778 m)     Head Circumference --      Peak Flow --      Pain Score 03/20/20 1249 8     Pain Loc --      Pain Edu? --      Excl. in [place]? --      Constitutional: Alert and oriented. Well appearing and in no acute distress. Eyes: Conjunctivae are normal. PERRL. EOMI. Head: Atraumatic. ENT:      Ears:      Nose: No congestion/rhinnorhea.      Mouth/Throat: Mucous membranes are moist.  Neck: No stridor. No cervical spine tenderness to palpation. Cardiovascular: Normal rate, regular rhythm.  Good peripheral circulation.  Symmetric radial  pulses bilaterally. Respiratory: Normal respiratory effort without tachypnea or retractions. Lungs CTAB. Good air entry to the bases with no decreased or absent breath sounds. Gastrointestinal: Bowel sounds 4 quadrants. Soft and nontender to palpation. No guarding or rigidity. No palpable masses. No distention. Musculoskeletal: Full range of motion to all extremities. No gross deformities appreciated.  Able to abduct the left shoulder without pain.  Able to flex and extend the left shoulder without pain.  Pain elicited with abduction of left shoulder.  Pain elicited with range of motion of left elbow.  Strength equal in upper extremities bilaterally. Neurologic:  Normal speech and language. No gross focal neurologic deficits are appreciated.  Skin:  Skin is warm, dry and intact. No  rash noted. Psychiatric: Mood and affect are normal. Speech and behavior are normal. Patient exhibits appropriate insight and judgement.   ____________________________________________   LABS (all labs ordered are listed, but only abnormal results are displayed)  Labs Reviewed - No data to display ____________________________________________  EKG   ____________________________________________  RADIOLOGY Robinette Haines, personally viewed and evaluated these images (plain radiographs) as part of my medical decision making, as well as reviewing the written report by the radiologist.  DG Elbow Complete Left  Result Date: 03/20/2020 CLINICAL DATA:  Pain EXAM: LEFT ELBOW - COMPLETE 3+ VIEW COMPARISON:  March 15, 2020 FINDINGS: Frontal, lateral, and bilateral oblique views were obtained. No evident fracture or dislocation. No joint effusion. Joint spaces appear normal. No erosive change. IMPRESSION: No fracture or dislocation.  No evident arthropathic change. Electronically Signed   By: Lowella Grip III M.D.   On: 03/20/2020 16:52   US Venous Img Upper Uni Left  Result Date: 03/20/2020 CLINICAL DATA:   69 year old with left arm pain for 3 days. EXAM: LEFT UPPER EXTREMITY VENOUS DOPPLER ULTRASOUND TECHNIQUE: Gray-scale sonography with graded compression, as well as color Doppler and duplex ultrasound were performed to evaluate the upper extremity deep venous system from the level of the subclavian vein and including the jugular, axillary, basilic, radial, ulnar and upper cephalic vein. Spectral Doppler was utilized to evaluate flow at rest and with distal augmentation maneuvers. COMPARISON:  None. FINDINGS: Contralateral Subclavian Vein: Normal color Doppler flow and phasicity. Internal Jugular Vein: No evidence of thrombus. Normal compressibility, color Doppler flow and phasicity. Subclavian Vein: No evidence of thrombus. Normal compressibility, color Doppler flow and phasicity. Axillary Vein: No evidence of thrombus. Normal compressibility, color Doppler flow and augmentation. Cephalic Vein: No evidence of thrombus. Normal compressibility, color Doppler flow and augmentation. Basilic Vein: No evidence of thrombus. Normal compressibility, color Doppler flow and augmentation. Brachial Veins: No evidence of thrombus. Normal compressibility, color flow and augmentation. Radial Veins: No evidence of thrombus. Normal compressibility and color Doppler flow. Ulnar Veins: No evidence of thrombus. Normal compressibility and color Doppler flow. Other Findings:  None visualized. IMPRESSION: No evidence of DVT within the left upper extremity. Electronically Signed   By: Markus Daft M.D.   On: 03/20/2020 17:24    ____________________________________________    PROCEDURES  Procedure(s) performed:    Procedures    Medications - No data to display   ____________________________________________   INITIAL IMPRESSION / ASSESSMENT AND PLAN / ED COURSE  Pertinent labs & imaging results that were available during my care of the patient were reviewed by me and considered in my medical decision making (see chart  for details).  Review of the [place] CSRS was performed in accordance of the [place] prior to dispensing any controlled drugs.   Patient presented to the emergency department for evaluation of left elbow pain that radiates into the left arm for 4 days.  Vital signs and exam are reassuring. X-ray today is negative for any acute bony abnormality. Ultrasound negative for DVT. Pain is reproducible with range of motion and palpation. I suspect inflammation or tendinitis. No SOB or CP. Patient will continue the mobic prescription he was prescribed in the ER 2 days ago. I will also give him a prescription for Robaxin. Patient will be discharged home with prescriptions for Robaxin. Patients blood pressure was elevated on discharge. He has not yet taken his blood pressure medication. He was encouraged to take his blood pressure medication when he gets home and as prescribed.  Patient is to follow up with his PCP as directed. Patient is given ED precautions to return to the ED for any worsening or new symptoms.  Nicholas Weiss was evaluated in Emergency Department on 03/20/2020 for the symptoms described in the history of present illness. He was evaluated in the context of the global COVID-19 pandemic, which necessitated consideration that the patient might be at risk for infection with the SARS-CoV-2 virus that causes COVID-19. Institutional protocols and algorithms that pertain to the evaluation of patients at risk for COVID-19 are in a state of rapid change based on information released by regulatory bodies including the CDC and federal and state organizations. These policies and algorithms were followed during the patient's care in the ED.   ____________________________________________  FINAL CLINICAL IMPRESSION(S) / ED DIAGNOSES  Final diagnoses:  Left elbow pain      NEW MEDICATIONS STARTED DURING THIS VISIT:  ED Discharge Orders         Ordered    methocarbamol (ROBAXIN) 500 MG tablet  3 times daily         03/20/20 1842              This chart was dictated using voice recognition software/Dragon. Despite best efforts to proofread, errors can occur which can change the meaning. Any change was purely unintentional.    Laban Emperor, PA-C 03/20/20 2242    Nance Pear, MD 03/21/20 [phone]

## 2020-03-24 ENCOUNTER — Other Ambulatory Visit: Payer: Self-pay | Admitting: Family Medicine

## 2020-03-24 DIAGNOSIS — D472 Monoclonal gammopathy: Secondary | ICD-10-CM

## 2020-03-26 ENCOUNTER — Telehealth: Payer: Self-pay

## 2020-03-26 NOTE — Telephone Encounter (Signed)
Copied from [place] [id number]. Topic: General - Other >> Mar 26, 2020  9:04 AM Celene Kras wrote: Reason for CRM: Pt called stating that he had a call from office. He states that there was no VM. Please advise.

## 2020-03-26 NOTE — Telephone Encounter (Signed)
-----   Message from Birdie Sons, MD sent at 03/24/2020 11:08 AM EST ----- Kidney functions are much better, but patient has unusual elevation of certain proteins in the blood stream called gamma globulins. He needs referral to hematology for further evaluation, have placed order. He should be getting call from Judson Roch this week.

## 2020-03-26 NOTE — Telephone Encounter (Signed)
Patient advised and agrees to referral. Please schedule. 

## 2020-04-01 NOTE — Telephone Encounter (Signed)
Pt calling again. He states that he has not received a call with his lab results and is requesting to have nurse give him a call back. Please advise.

## 2020-04-02 NOTE — Telephone Encounter (Signed)
Is Nicholas Weiss off? Patient is calling requesting an update for this referral appointment. Please call patient.

## 2020-04-08 ENCOUNTER — Encounter: Payer: Self-pay | Admitting: Oncology

## 2020-04-08 ENCOUNTER — Encounter (INDEPENDENT_AMBULATORY_CARE_PROVIDER_SITE_OTHER): Payer: Self-pay

## 2020-04-08 ENCOUNTER — Inpatient Hospital Stay: Payer: Medicare HMO | Attending: Oncology | Admitting: Oncology

## 2020-04-08 ENCOUNTER — Inpatient Hospital Stay: Payer: Medicare HMO

## 2020-04-08 VITALS — BP 130/81 | HR 76 | Temp 98.1°F | Resp 16 | Ht 70.0 in | Wt 169.3 lb

## 2020-04-08 DIAGNOSIS — E1022 Type 1 diabetes mellitus with diabetic chronic kidney disease: Secondary | ICD-10-CM | POA: Insufficient documentation

## 2020-04-08 DIAGNOSIS — Z79899 Other long term (current) drug therapy: Secondary | ICD-10-CM | POA: Insufficient documentation

## 2020-04-08 DIAGNOSIS — R778 Other specified abnormalities of plasma proteins: Secondary | ICD-10-CM

## 2020-04-08 DIAGNOSIS — E7800 Pure hypercholesterolemia, unspecified: Secondary | ICD-10-CM | POA: Diagnosis not present

## 2020-04-08 DIAGNOSIS — D631 Anemia in chronic kidney disease: Secondary | ICD-10-CM | POA: Diagnosis not present

## 2020-04-08 DIAGNOSIS — I129 Hypertensive chronic kidney disease with stage 1 through stage 4 chronic kidney disease, or unspecified chronic kidney disease: Secondary | ICD-10-CM | POA: Insufficient documentation

## 2020-04-08 DIAGNOSIS — Z8249 Family history of ischemic heart disease and other diseases of the circulatory system: Secondary | ICD-10-CM | POA: Diagnosis not present

## 2020-04-08 DIAGNOSIS — D649 Anemia, unspecified: Secondary | ICD-10-CM | POA: Diagnosis not present

## 2020-04-08 DIAGNOSIS — Z794 Long term (current) use of insulin: Secondary | ICD-10-CM | POA: Diagnosis not present

## 2020-04-08 DIAGNOSIS — N1831 Chronic kidney disease, stage 3a: Secondary | ICD-10-CM | POA: Diagnosis not present

## 2020-04-08 DIAGNOSIS — Z7982 Long term (current) use of aspirin: Secondary | ICD-10-CM | POA: Diagnosis not present

## 2020-04-08 LAB — RETICULOCYTES
Immature Retic Fract: 9.4 % (ref 2.3–15.9)
RBC.: 3.42 MIL/uL — ABNORMAL LOW (ref 4.22–5.81)
Retic Count, Absolute: 23.9 10*3/uL (ref 19.0–186.0)
Retic Ct Pct: 0.7 % (ref 0.4–3.1)

## 2020-04-08 LAB — CBC WITH DIFFERENTIAL/PLATELET
Abs Immature Granulocytes: 0.01 10*3/uL (ref 0.00–0.07)
Basophils Absolute: 0 10*3/uL (ref 0.0–0.1)
Basophils Relative: 0 %
Eosinophils Absolute: 0 10*3/uL (ref 0.0–0.5)
Eosinophils Relative: 1 %
HCT: 31.2 % — ABNORMAL LOW (ref 39.0–52.0)
Hemoglobin: 10.5 g/dL — ABNORMAL LOW (ref 13.0–17.0)
Immature Granulocytes: 0 %
Lymphocytes Relative: 35 %
Lymphs Abs: 1 10*3/uL (ref 0.7–4.0)
MCH: 30.7 pg (ref 26.0–34.0)
MCHC: 33.7 g/dL (ref 30.0–36.0)
MCV: 91.2 fL (ref 80.0–100.0)
Monocytes Absolute: 0.3 10*3/uL (ref 0.1–1.0)
Monocytes Relative: 10 %
Neutro Abs: 1.5 10*3/uL — ABNORMAL LOW (ref 1.7–7.7)
Neutrophils Relative %: 54 %
Platelets: 191 10*3/uL (ref 150–400)
RBC: 3.42 MIL/uL — ABNORMAL LOW (ref 4.22–5.81)
RDW: 12.5 % (ref 11.5–15.5)
WBC: 2.9 10*3/uL — ABNORMAL LOW (ref 4.0–10.5)
nRBC: 0 % (ref 0.0–0.2)

## 2020-04-08 LAB — IRON AND TIBC
Iron: 56 ug/dL (ref 45–182)
Saturation Ratios: 22 % (ref 17.9–39.5)
TIBC: 252 ug/dL (ref 250–450)
UIBC: 196 ug/dL

## 2020-04-08 LAB — FOLATE: Folate: 14.8 ng/mL (ref >5.9)

## 2020-04-08 LAB — COMPREHENSIVE METABOLIC PANEL
ALT: 19 U/L (ref 0–44)
AST: 19 U/L (ref 15–41)
Albumin: 3.6 g/dL (ref 3.5–5.0)
Alkaline Phosphatase: 50 U/L (ref 38–126)
Anion gap: 7 (ref 5–15)
BUN: 26 mg/dL — ABNORMAL HIGH (ref 8–23)
CO2: 26 mmol/L (ref 22–32)
Calcium: 8.4 mg/dL — ABNORMAL LOW (ref 8.9–10.3)
Chloride: 101 mmol/L (ref 98–111)
Creatinine, Ser: 1.29 mg/dL — ABNORMAL HIGH (ref 0.61–1.24)
GFR, Estimated: >60 mL/min (ref >60)
Glucose, Bld: 159 mg/dL — ABNORMAL HIGH (ref 70–99)
Potassium: 4.4 mmol/L (ref 3.5–5.1)
Sodium: 134 mmol/L — ABNORMAL LOW (ref 135–145)
Total Bilirubin: 0.4 mg/dL (ref 0.3–1.2)
Total Protein: 9.1 g/dL — ABNORMAL HIGH (ref 6.5–8.1)

## 2020-04-08 LAB — FERRITIN: Ferritin: 112 ng/mL (ref 24–336)

## 2020-04-08 LAB — VITAMIN B12: Vitamin B-12: 296 pg/mL (ref 180–914)

## 2020-04-08 LAB — TSH: TSH: 1.6 u[IU]/mL (ref 0.350–4.500)

## 2020-04-08 NOTE — Progress Notes (Signed)
[place]  Hematology/Oncology Consult note [facility] [phone] [phone]  Patient Care Team: Birdie Sons, MD as PCP - General (Family Medicine) [place], [facility])   Name of the patient: Nicholas Weiss  [id number]  [birth year]    Reason for referral-abnormal SPEP   Referring physician-Dr. Lelon Huh  Date of visit: 04/08/20   History of presenting illness- Patient is a 70 year old African-American male with a past medical history significant for uncontrolled type 1 diabetes, chronic kidney disease stage III chronic normocytic anemia referred for abnormal SPEP. Patient's creatinine has been fluctuating between 1.5-2 but about 5 months ago it went up all the way to 5 and his blood sugars were in the 1000 range. More recently his kidney numbers have been drifting back to normal values. As a part of the work-up he had serum protein electrophoresis done which showed an elevated gammaglobulin fraction with an M spike of 3%. The amount of M spike was not quantified in the specimen. He has therefore been referred to [place] for further management. Patient endorses chronic fatigue reports that his appetite and weight have remained stable  ECOG PS- 1  Pain scale- 0   Review of systems- Review of Systems  Constitutional: Positive for malaise/fatigue. Negative for chills, fever and weight loss.  HENT: Negative for congestion, ear discharge and nosebleeds.   Eyes: Negative for blurred vision.  Respiratory: Negative for cough, hemoptysis, sputum production, shortness of breath and wheezing.   Cardiovascular: Negative for chest pain, palpitations, orthopnea and claudication.  Gastrointestinal: Negative for abdominal pain, blood in stool, constipation, diarrhea, heartburn, melena, nausea and vomiting.  Genitourinary: Negative for dysuria, flank pain, frequency, hematuria and urgency.  Musculoskeletal: Negative for back pain, joint pain and  myalgias.  Skin: Negative for rash.  Neurological: Negative for dizziness, tingling, focal weakness, seizures, weakness and headaches.  Endo/Heme/Allergies: Does not bruise/bleed easily.  Psychiatric/Behavioral: Negative for depression and suicidal ideas. The patient does not have insomnia.     Allergies  Allergen Reactions  . Penicillins Other (See Comments)    Has patient had a PCN reaction causing immediate rash, facial/tongue/throat swelling, SOB or lightheadedness with hypotension: No Has patient had a PCN reaction causing severe rash involving mucus membranes or skin necrosis: No Has patient had a PCN reaction that required hospitalization No Has patient had a PCN reaction occurring within the last 10 years: No If all of the above answers are "NO", then may proceed with Cephalosporin use.     Patient Active Problem List   Diagnosis Date Noted  . PUD (peptic ulcer disease)   . Esophageal dysphagia   . Encounter for screening colonoscopy   . Hyperglycemia due to type 2 diabetes mellitus ([place]) 07/28/2019  . Generalized weakness 07/28/2019  . Type 2 diabetes mellitus with hyperglycemia ([place]) 07/28/2019  . Hypoglycemia 04/27/2019  . Unresponsiveness 04/27/2019  . Lung nodule 04/07/2019  . Uncontrolled type 1 diabetes mellitus ([place])   . Acute renal failure with acute tubular necrosis superimposed on stage 3a chronic kidney disease ([place])   . Hypertensive urgency 04/05/2019  . AKI (acute kidney injury) ([place]) 04/05/2019  . CKD (chronic kidney disease) stage 3, GFR 30-59 ml/min (HCC) 04/05/2019  . DKA (diabetic ketoacidoses) 04/06/2016  . Hypercholesterolemia 01/24/2016  . Essential (primary) hypertension 06/07/2006  . Type 1 diabetes mellitus with diabetic neuropathy, unspecified ([place]) 05/31/2006     Past Medical History:  Diagnosis Date  . DKA (diabetic ketoacidoses) 04/06/2016  . Hypercholesteremia   .  Hypertension      Past Surgical History:  Procedure Laterality Date   . COLONOSCOPY WITH PROPOFOL N/A 02/01/2020   Procedure: COLONOSCOPY WITH PROPOFOL;  Surgeon: Lin Landsman, MD;  Location: [facility] ENDOSCOPY;  Service: Gastroenterology;  Laterality: N/A;  . ESOPHAGOGASTRODUODENOSCOPY  02/01/2020   Procedure: ESOPHAGOGASTRODUODENOSCOPY (EGD);  Surgeon: Lin Landsman, MD;  Location: [facility] ENDOSCOPY;  Service: Gastroenterology;;  . KNEE SURGERY Right    Torn meniscus  . KNEE SURGERY Left     Social History   Socioeconomic History  . Marital status: Significant Other    Spouse name: Not on file  . Number of children: 3  . Years of education: Not on file  . 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 Smoking status: Never Smoker  . Smokeless tobacco: Never Used  Vaping Use  . Vaping Use: Never used  Substance and Sexual Activity  . Alcohol use: Yes    Alcohol/week: 0.0 - 1.0 standard drinks  . Drug use: Yes    Types: Marijuana  . Sexual activity: Yes    Birth control/protection: None  Other Topics Concern  . Not on file  Social History Narrative  . Not on file   Social Determinants of Health   Financial Resource Strain: Low Risk   . Difficulty of Paying Living Expenses: Not hard at all  Food Insecurity: No Food Insecurity  . Worried About Charity fundraiser in the Last Year: Never true  . Ran Out of Food in the Last Year: Never true  Transportation Needs: No Transportation Needs  . Lack of Transportation (Medical): No  . Lack of Transportation (Non-Medical): No  Physical Activity: Insufficiently Active  . Days of Exercise per Week: 7 days  . Minutes of Exercise per Session: 20 min  Stress: No Stress Concern Present  . Feeling of Stress : Not at all  Social Connections: Moderately Isolated  . Frequency of Communication with Friends and Family: More than three times a week  . Frequency of Social Gatherings with Friends and Family: More than three times a  week  . Attends Religious Services: Never  . Active Member of Clubs or Organizations: No  . Attends Archivist Meetings: Never  . Marital Status: Living with partner  Intimate Partner Violence: Not At Risk  . Fear of Current or Ex-Partner: No  . Emotionally Abused: No  . Physically Abused: No  . Sexually Abused: No     Family History  Problem Relation Age of Onset  . Heart attack Father   . Hypertension Sister   . Cancer Sister      Current Outpatient Medications:  .  amLODipine (NORVASC) 10 MG tablet, Take 1 tablet (10 mg total) by mouth daily., Disp: 90 tablet, Rfl: 1 .  aspirin 81 MG chewable tablet, Chew 1 tablet by mouth daily., Disp: , Rfl:  .  insulin aspart (NOVOLOG) 100 UNIT/ML injection, Inject up to 14 units three times daily before meals according to sliding scale, Disp: 10 mL, Rfl: 5 .  lovastatin (MEVACOR) 40 MG tablet, Take 1 tablet (40 mg total) by mouth at bedtime., Disp: 90 tablet, Rfl: 4 .  methocarbamol (ROBAXIN) 500 MG tablet, Take 1 tablet (500 mg total) by mouth 3 (three) times daily., Disp: 15 tablet, Rfl: 0 .  omeprazole (PRILOSEC) 40 MG capsule, Take 1 capsule (40 mg total) by mouth 2 (two) times daily before a  meal., Disp: 60 capsule, Rfl: 2 .  pregabalin (LYRICA) 75 MG capsule, Take 1 capsule (75 mg total) by mouth 2 (two) times daily., Disp: 60 capsule, Rfl: 3 .  TRESIBA FLEXTOUCH 100 UNIT/ML FlexTouch Pen, INJECT 30 UNITS SUBCUTANEOUSLY ONCE DAILY. INCREASE AS DIRECTED TO KEEP FASTING GLUCOSE LESS THAN 120. (Patient taking differently: 28 Units daily.), Disp: 15 mL, Rfl: 0   Physical exam: There were no vitals filed for this visit. Physical Exam Constitutional:      General: He is not in acute distress. Eyes:     Extraocular Movements: EOM normal.  Cardiovascular:     Rate and Rhythm: Normal rate and regular rhythm.     Heart sounds: Normal heart sounds.  Pulmonary:     Effort: Pulmonary effort is normal.     Breath sounds: Normal  breath sounds.  Abdominal:     General: Bowel sounds are normal.     Palpations: Abdomen is soft.  Lymphadenopathy:     Comments: No palpable cervical, supraclavicular, axillary or inguinal adenopathy   Skin:    General: Skin is warm and dry.  Neurological:     Mental Status: He is alert and oriented to person, place, and time.        CMP Latest Ref Rng & Units 03/15/2020  Glucose 65 - 99 mg/dL -  BUN 8 - 27 mg/dL -  Creatinine 0.76 - 1.27 mg/dL -  Sodium 134 - 144 mmol/L -  Potassium 3.5 - 5.2 mmol/L -  Chloride 96 - 106 mmol/L -  CO2 20 - 29 mmol/L -  Calcium 8.6 - 10.2 mg/dL -  Total Protein 6.0 - 8.5 g/dL 8.1  Total Bilirubin 0.0 - 1.2 mg/dL -  Alkaline Phos 44 - 121 IU/L -  AST 0 - 40 IU/L -  ALT 0 - 44 IU/L -   CBC Latest Ref Rng & Units 03/15/2020  WBC 3.4 - 10.8 x10E3/uL 4.4  Hemoglobin 13.0 - 17.7 g/dL 10.0(L)  Hematocrit 37.5 - 51.0 % 30.4(L)  Platelets 150 - 450 x10E3/uL 205    No images are attached to the encounter.  DG Elbow Complete Left  Result Date: 03/20/2020 CLINICAL DATA:  Pain EXAM: LEFT ELBOW - COMPLETE 3+ VIEW COMPARISON:  March 15, 2020 FINDINGS: Frontal, lateral, and bilateral oblique views were obtained. No evident fracture or dislocation. No joint effusion. Joint spaces appear normal. No erosive change. IMPRESSION: No fracture or dislocation.  No evident arthropathic change. Electronically Signed   By: Lowella Grip III M.D.   On: 03/20/2020 16:52   DG ELBOW COMPLETE LEFT (3+VIEW)  Result Date: 03/15/2020 CLINICAL DATA:  No known injury, anterior pain for 2 days EXAM: LEFT ELBOW - COMPLETE 3+ VIEW COMPARISON:  None. FINDINGS: Some questionable irregularity along the articular surface of the coronoid process seen on the oblique view. Possibly degenerative though could correlate for point tenderness to exclude an acute injury which is less favored given the absence of trauma. Mild soft tissue swelling of the elbow is present. No large  effusion. No other sites concerning for fracture or other acute osseous abnormality. Mild degenerative enthesopathic spurring along the medial and lateral epicondyles of the humerus. IMPRESSION: 1. Some questionable irregularity along the articular surface of the coronoid process seen on the oblique view. Favor degenerative though could correlate for point tenderness to exclude an acute injury which is less favored given the absence of trauma or effusion. 2. Mild soft tissue swelling of the elbow. 3. Additional mild degenerative  changes of the elbow with enthesopathic changes of the medial and lateral epicondyles. Electronically Signed   By: Lovena Le M.D.   On: 03/15/2020 19:50   US Venous Img Upper Uni Left  Result Date: 03/20/2020 CLINICAL DATA:  70 year old with left arm pain for 3 days. EXAM: LEFT UPPER EXTREMITY VENOUS DOPPLER ULTRASOUND TECHNIQUE: Gray-scale sonography with graded compression, as well as color Doppler and duplex ultrasound were performed to evaluate the upper extremity deep venous system from the level of the subclavian vein and including the jugular, axillary, basilic, radial, ulnar and upper cephalic vein. Spectral Doppler was utilized to evaluate flow at rest and with distal augmentation maneuvers. COMPARISON:  None. FINDINGS: Contralateral Subclavian Vein: Normal color Doppler flow and phasicity. Internal Jugular Vein: No evidence of thrombus. Normal compressibility, color Doppler flow and phasicity. Subclavian Vein: No evidence of thrombus. Normal compressibility, color Doppler flow and phasicity. Axillary Vein: No evidence of thrombus. Normal compressibility, color Doppler flow and augmentation. Cephalic Vein: No evidence of thrombus. Normal compressibility, color Doppler flow and augmentation. Basilic Vein: No evidence of thrombus. Normal compressibility, color Doppler flow and augmentation. Brachial Veins: No evidence of thrombus. Normal compressibility, color flow and  augmentation. Radial Veins: No evidence of thrombus. Normal compressibility and color Doppler flow. Ulnar Veins: No evidence of thrombus. Normal compressibility and color Doppler flow. Other Findings:  None visualized. IMPRESSION: No evidence of DVT within the left upper extremity. Electronically Signed   By: Markus Daft M.D.   On: 03/20/2020 17:24    Assessment and plan- Patient is a 70 y.o. male referred for abnormal SPEP  Abnormal SPEP: Patient noted to have elevated gammaglobulin fraction and M protein of 3% which was not quantified. Today I'll check a Myeloma panel as well as serum free light chains.  Normocytic anemia: Likely secondary to anemia of chronic disease from uncontrolled diabetes as well as anemia of chronic kidney disease. I will do a complete anemia work-up including a CBC with differential, CMP, ferritin and iron studies, B12 and folate, reticulocyte count and haptoglobin.  In person or video visit in 2 weeks time   Visit Diagnosis 1. Normocytic anemia   2. Anemia in chronic kidney disease, unspecified CKD stage   3. Abnormal SPEP     Dr. Randa Evens, MD, MPH [facility] [phone] 04/08/2020 2:56 PM

## 2020-04-08 NOTE — Progress Notes (Signed)
Patient didn't have any concern

## 2020-04-09 LAB — PROTEIN ELECTRO, RANDOM URINE
Albumin ELP, Urine: 38.3 %
Alpha-1-Globulin, U: 2.5 %
Alpha-2-Globulin, U: 9 %
Beta Globulin, U: 25.9 %
Gamma Globulin, U: 24.3 %
M Component, Ur: 17.9 % — ABNORMAL HIGH
Total Protein, Urine: 47.7 mg/dL

## 2020-04-09 LAB — KAPPA/LAMBDA LIGHT CHAINS
Kappa free light chain: 16.8 mg/L (ref 3.3–19.4)
Kappa, lambda light chain ratio: 0.05 — ABNORMAL LOW (ref 0.26–1.65)
Lambda free light chains: 370.6 mg/L — ABNORMAL HIGH (ref 5.7–26.3)

## 2020-04-09 LAB — HAPTOGLOBIN: Haptoglobin: 158 mg/dL (ref 32–363)

## 2020-04-10 LAB — MULTIPLE MYELOMA PANEL, SERUM
Albumin SerPl Elph-Mcnc: 3.6 g/dL (ref 2.9–4.4)
Albumin/Glob SerPl: 0.8 (ref 0.7–1.7)
Alpha 1: 0.2 g/dL (ref 0.0–0.4)
Alpha2 Glob SerPl Elph-Mcnc: 0.7 g/dL (ref 0.4–1.0)
B-Globulin SerPl Elph-Mcnc: 0.7 g/dL (ref 0.7–1.3)
Gamma Glob SerPl Elph-Mcnc: 3.3 g/dL — ABNORMAL HIGH (ref 0.4–1.8)
Globulin, Total: 4.9 g/dL — ABNORMAL HIGH (ref 2.2–3.9)
IgA: 27 mg/dL — ABNORMAL LOW (ref 61–437)
IgG (Immunoglobin G), Serum: 3806 mg/dL — ABNORMAL HIGH (ref 603–1613)
IgM (Immunoglobulin M), Srm: <5 mg/dL — ABNORMAL LOW (ref 20–172)
M Protein SerPl Elph-Mcnc: 3.1 g/dL — ABNORMAL HIGH
Total Protein ELP: 8.5 g/dL (ref 6.0–8.5)

## 2020-04-20 ENCOUNTER — Other Ambulatory Visit: Payer: Self-pay | Admitting: Family Medicine

## 2020-04-22 ENCOUNTER — Inpatient Hospital Stay: Payer: Medicare HMO | Admitting: Oncology

## 2020-04-22 ENCOUNTER — Telehealth: Payer: Self-pay

## 2020-04-22 NOTE — Telephone Encounter (Signed)
Pt states he has no ins for insulin and has been completely out all weekend, waiting on cb

## 2020-04-22 NOTE — Telephone Encounter (Signed)
Copied from [place] [id number]. Topic: General - Other >> Apr 22, 2020  9:16 AM Leward Quan A wrote: Reason for CRM: Patient called in to inquire of Dr Caryn Section if he have samples of TRESIBA FLEXTOUCH 100 UNIT/ML FlexTouch Pen per patient his insurance have changed effective 04/30/20 and the Rx is too expensive to pay out of pocket. Please advise can be reached at Ph#  [phone]

## 2020-04-23 ENCOUNTER — Inpatient Hospital Stay (HOSPITAL_BASED_OUTPATIENT_CLINIC_OR_DEPARTMENT_OTHER): Payer: Medicare HMO | Admitting: Oncology

## 2020-04-23 ENCOUNTER — Ambulatory Visit
Admission: RE | Admit: 2020-04-23 | Discharge: 2020-04-23 | Disposition: A | Discharge status: Home / Self Care | Payer: Medicare HMO | Source: Ambulatory Visit | Attending: Oncology | Admitting: Oncology

## 2020-04-23 ENCOUNTER — Other Ambulatory Visit: Payer: Self-pay

## 2020-04-23 ENCOUNTER — Other Ambulatory Visit: Payer: Self-pay | Admitting: Family Medicine

## 2020-04-23 VITALS — BP 125/79 | HR 83 | Temp 96.9°F | Resp 20 | Wt 165.3 lb

## 2020-04-23 DIAGNOSIS — R778 Other specified abnormalities of plasma proteins: Secondary | ICD-10-CM | POA: Diagnosis not present

## 2020-04-23 DIAGNOSIS — Z794 Long term (current) use of insulin: Secondary | ICD-10-CM | POA: Diagnosis not present

## 2020-04-23 DIAGNOSIS — R899 Unspecified abnormal finding in specimens from other organs, systems and tissues: Secondary | ICD-10-CM

## 2020-04-23 DIAGNOSIS — D649 Anemia, unspecified: Secondary | ICD-10-CM | POA: Diagnosis not present

## 2020-04-23 DIAGNOSIS — M79642 Pain in left hand: Secondary | ICD-10-CM | POA: Diagnosis not present

## 2020-04-23 DIAGNOSIS — E1022 Type 1 diabetes mellitus with diabetic chronic kidney disease: Secondary | ICD-10-CM | POA: Diagnosis not present

## 2020-04-23 DIAGNOSIS — Z8249 Family history of ischemic heart disease and other diseases of the circulatory system: Secondary | ICD-10-CM | POA: Diagnosis not present

## 2020-04-23 DIAGNOSIS — Z7982 Long term (current) use of aspirin: Secondary | ICD-10-CM | POA: Diagnosis not present

## 2020-04-23 DIAGNOSIS — S52592A Other fractures of lower end of left radius, initial encounter for closed fracture: Secondary | ICD-10-CM | POA: Diagnosis not present

## 2020-04-23 DIAGNOSIS — Z79899 Other long term (current) drug therapy: Secondary | ICD-10-CM | POA: Diagnosis not present

## 2020-04-23 DIAGNOSIS — E7800 Pure hypercholesterolemia, unspecified: Secondary | ICD-10-CM | POA: Diagnosis not present

## 2020-04-23 DIAGNOSIS — N1831 Chronic kidney disease, stage 3a: Secondary | ICD-10-CM | POA: Diagnosis not present

## 2020-04-23 DIAGNOSIS — I129 Hypertensive chronic kidney disease with stage 1 through stage 4 chronic kidney disease, or unspecified chronic kidney disease: Secondary | ICD-10-CM | POA: Diagnosis not present

## 2020-04-23 IMAGING — CR DG HAND COMPLETE 3+V*L*
3 series · 3 of 3 positions shown · non-contrast
Comparison: None.

CLINICAL DATA: Pain status post fall.

EXAM:
LEFT HAND - COMPLETE 3+ VIEW

[hand ap]
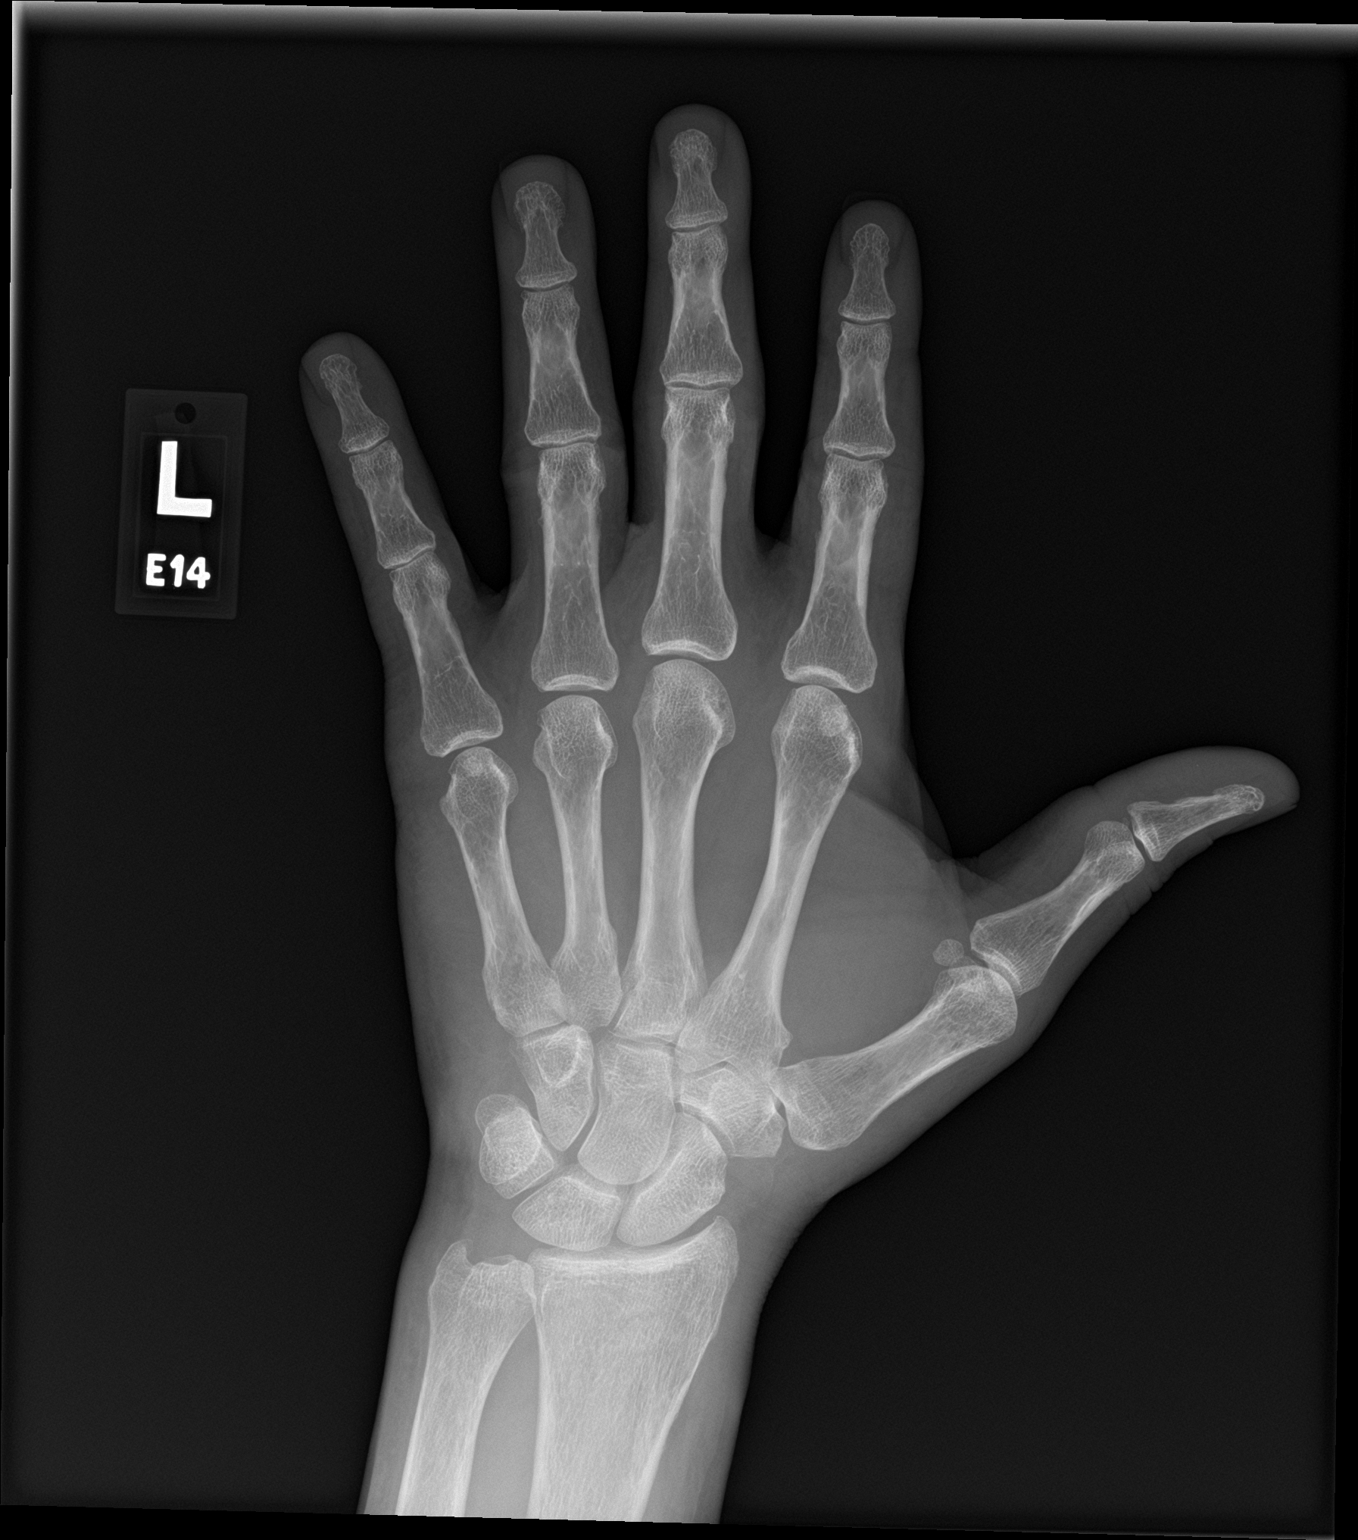

[hand obl]
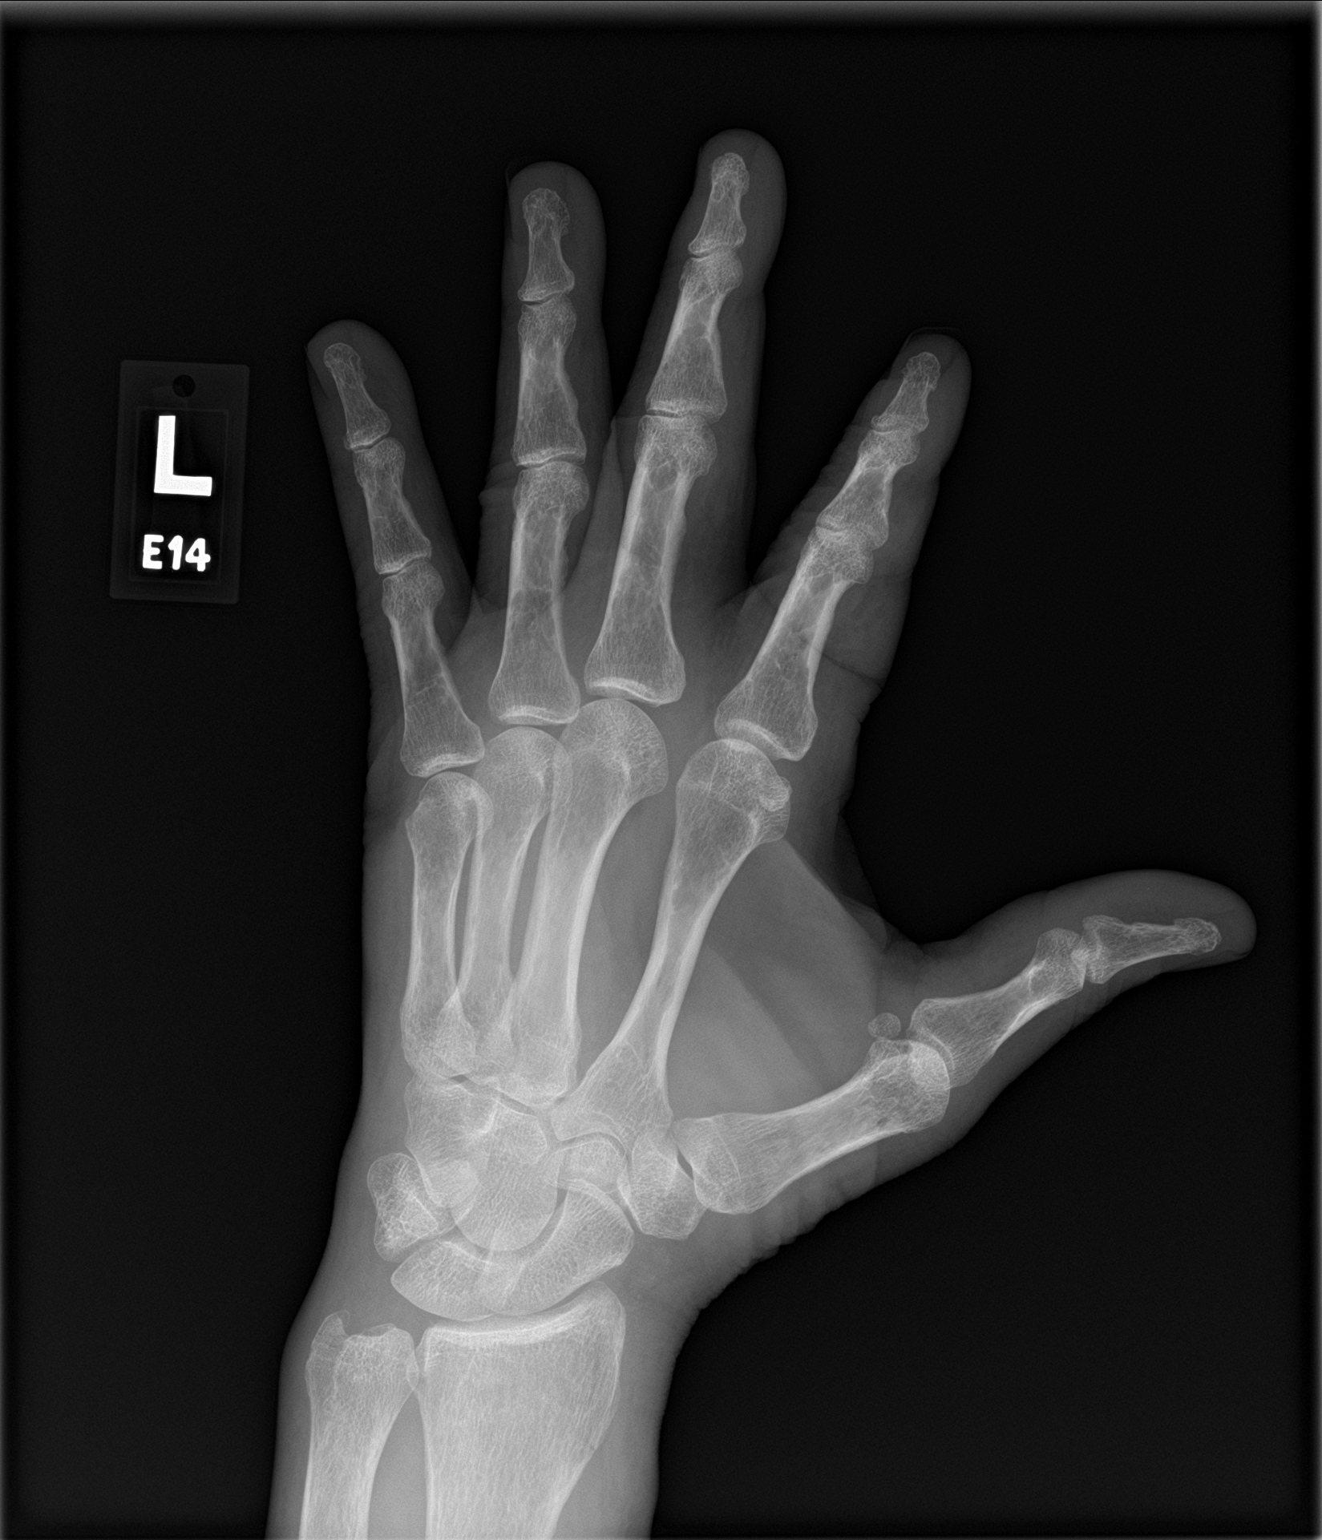

[hand lat]
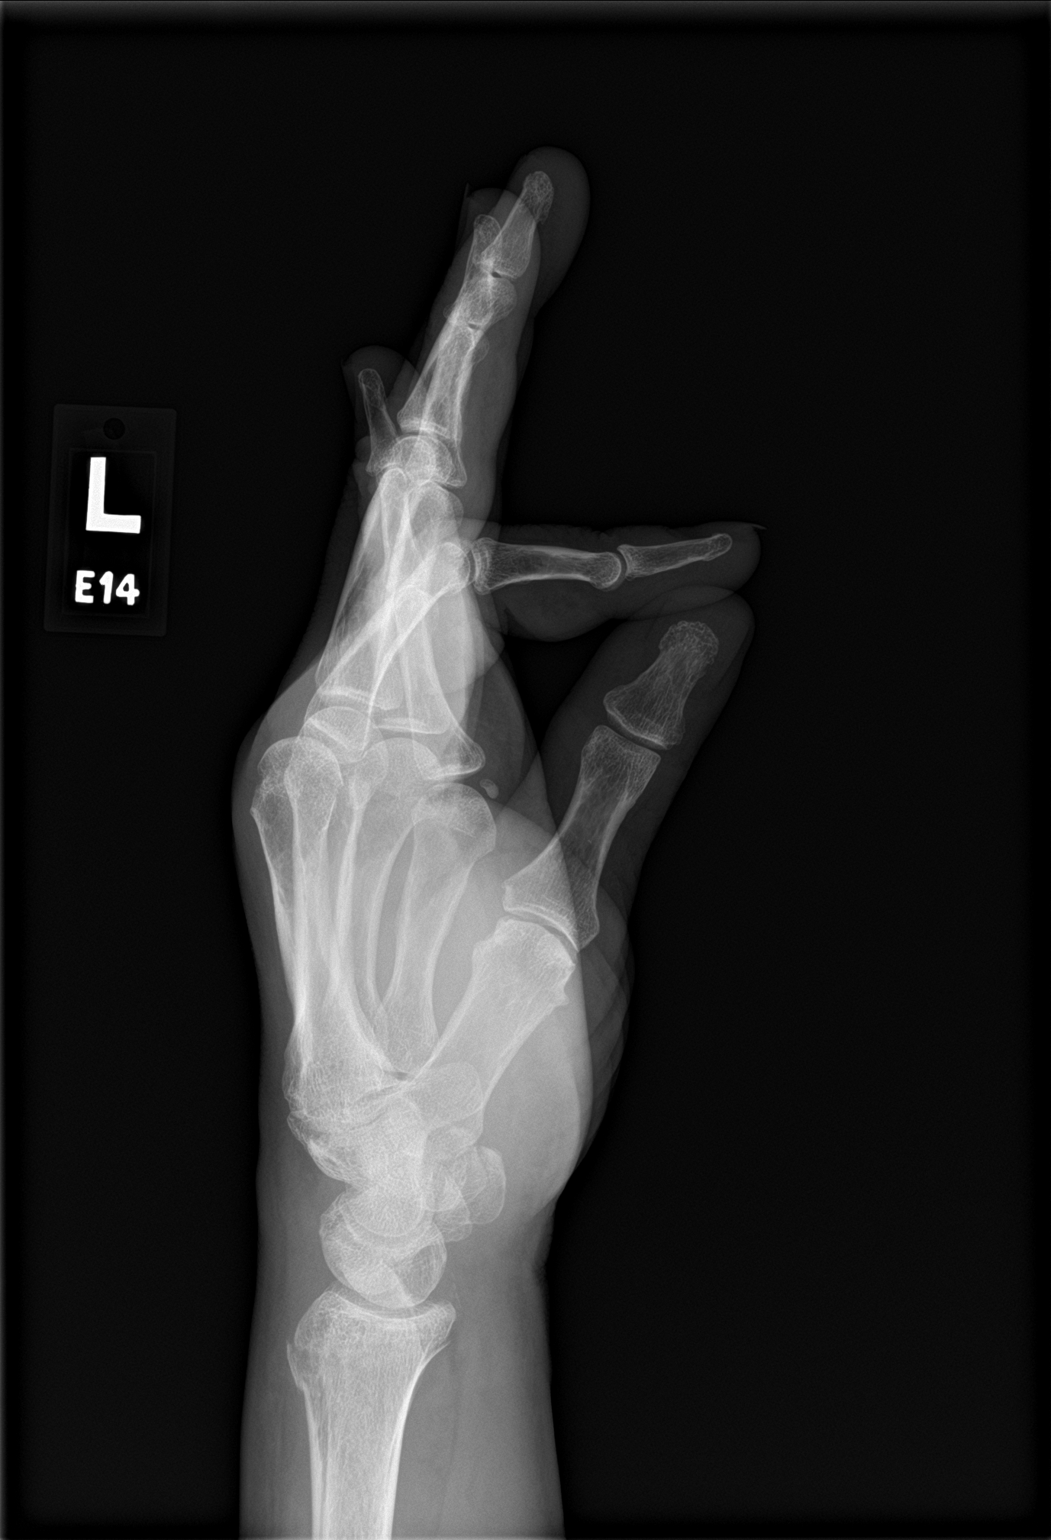

[3 of 3 positions shown; findings below may reference images not displayed]

FINDINGS: There is a nondisplaced fracture through the distal radius. There is
surrounding soft tissue swelling. There is no dislocation. Mild
degenerative changes are noted of the hand.
IMPRESSION: Nondisplaced fracture through the distal radius with surrounding
soft tissue swelling. No dislocation. Mild degenerative changes of
the hand.

## 2020-04-23 NOTE — Telephone Encounter (Signed)
We have 1 box left of tresiba currently.

## 2020-04-23 NOTE — Telephone Encounter (Signed)
OK, he can have one box. Is he going to be getting insurance to cover [name]? If not then we can apply for patient assistance.

## 2020-04-24 ENCOUNTER — Other Ambulatory Visit: Payer: Self-pay

## 2020-04-24 DIAGNOSIS — S52502A Unspecified fracture of the lower end of left radius, initial encounter for closed fracture: Secondary | ICD-10-CM

## 2020-04-24 MED ORDER — TRESIBA FLEXTOUCH 100 UNIT/ML ~~LOC~~ SOPN: PEN_INJECTOR | SUBCUTANEOUS | 0 refills | Status: DC

## 2020-04-24 NOTE — Telephone Encounter (Signed)
Patient advised sample is ready for pick up. Patient states he now has insurance and his insurance will cover [name].

## 2020-04-25 ENCOUNTER — Encounter: Payer: Self-pay | Admitting: Oncology

## 2020-04-25 DIAGNOSIS — Z79899 Other long term (current) drug therapy: Secondary | ICD-10-CM | POA: Diagnosis not present

## 2020-04-25 DIAGNOSIS — Z7982 Long term (current) use of aspirin: Secondary | ICD-10-CM | POA: Diagnosis not present

## 2020-04-25 DIAGNOSIS — I129 Hypertensive chronic kidney disease with stage 1 through stage 4 chronic kidney disease, or unspecified chronic kidney disease: Secondary | ICD-10-CM | POA: Diagnosis not present

## 2020-04-25 DIAGNOSIS — Z8249 Family history of ischemic heart disease and other diseases of the circulatory system: Secondary | ICD-10-CM | POA: Diagnosis not present

## 2020-04-25 DIAGNOSIS — R778 Other specified abnormalities of plasma proteins: Secondary | ICD-10-CM | POA: Diagnosis not present

## 2020-04-25 DIAGNOSIS — E1022 Type 1 diabetes mellitus with diabetic chronic kidney disease: Secondary | ICD-10-CM | POA: Diagnosis not present

## 2020-04-25 DIAGNOSIS — D649 Anemia, unspecified: Secondary | ICD-10-CM | POA: Diagnosis not present

## 2020-04-25 DIAGNOSIS — N1831 Chronic kidney disease, stage 3a: Secondary | ICD-10-CM | POA: Diagnosis not present

## 2020-04-25 DIAGNOSIS — E7800 Pure hypercholesterolemia, unspecified: Secondary | ICD-10-CM | POA: Diagnosis not present

## 2020-04-25 DIAGNOSIS — Z794 Long term (current) use of insulin: Secondary | ICD-10-CM | POA: Diagnosis not present

## 2020-04-25 NOTE — Progress Notes (Signed)
   Hematology/Oncology Consult note [facility]  [phone] [phone]  Patient Care Team: Fisher, Donald E, MD as PCP - General (Family Medicine) Pa, [facility] (Optometry)   Name of the patient: Nicholas Weiss  [id number]  01/13/1951   Date of visit: 04/25/20  Diagnosis-normocytic anemia  Chief complaint/ Reason for visit-discussed results of blood work  Heme/Onc history: Patient is a 69-year-old African-American male with a past medical history significant for uncontrolled type 1 diabetes, chronic kidney disease stage III chronic normocytic anemia referred for abnormal SPEP. Patient's creatinine has been fluctuating between 1.5-2 but about 5 months ago it went up all the way to 5 and his blood sugars were in the 1000 range. More recently his kidney numbers have been drifting back to normal values. As a part of the work-up he had serum protein electrophoresis done which showed an elevated gammaglobulin fraction with an M spike of 3%. The amount of M spike was not quantified in the specimen. He has therefore been referred to us for further management. Patient endorses chronic fatigue reports that his appetite and weight have remained stable  Results of blood work from 04/08/2020 were as follows: CBC showed white count of 2.9, H&H of 10.5/31.2 with an MCV of 91 and a platelet count of 191.  CMP showed a mildly elevated creatinine of 1.2 which was better as compared to 5 months ago when it was 3.9.  Total protein was mildly elevated at 9.1 calcium normal at 8.4 ferritin and iron studies B12 folate TSH and haptoglobin were normal.  Myeloma panel revealed an elevated IgG level of 06/04/2004 with an M protein of 3.1 g.  Immunofixation showed IgG lambda specificity.  Serum free light chain ratio was elevated at 25 and free light chain lambda elevated at 370  Interval history-patient had a recent fall a few days ago and reports that his left hand is  swollen  ECOG PS- 1 Pain scale- 0   Review of systems- Review of Systems  Constitutional: Positive for malaise/fatigue. Negative for chills, fever and weight loss.  HENT: Negative for congestion, ear discharge and nosebleeds.   Eyes: Negative for blurred vision.  Respiratory: Negative for cough, hemoptysis, sputum production, shortness of breath and wheezing.   Cardiovascular: Negative for chest pain, palpitations, orthopnea and claudication.  Gastrointestinal: Negative for abdominal pain, blood in stool, constipation, diarrhea, heartburn, melena, nausea and vomiting.  Genitourinary: Negative for dysuria, flank pain, frequency, hematuria and urgency.  Musculoskeletal: Negative for back pain, joint pain and myalgias.  Skin: Negative for rash.  Neurological: Negative for dizziness, tingling, focal weakness, seizures, weakness and headaches.  Endo/Heme/Allergies: Does not bruise/bleed easily.  Psychiatric/Behavioral: Negative for depression and suicidal ideas. The patient does not have insomnia.       Allergies  Allergen Reactions  . Penicillins Other (See Comments)    Has patient had a PCN reaction causing immediate rash, facial/tongue/throat swelling, SOB or lightheadedness with hypotension: No Has patient had a PCN reaction causing severe rash involving mucus membranes or skin necrosis: No Has patient had a PCN reaction that required hospitalization No Has patient had a PCN reaction occurring within the last 10 years: No If all of the above answers are "NO", then may proceed with Cephalosporin use.      Past Medical History:  Diagnosis Date  . DKA (diabetic ketoacidoses) 04/06/2016  . Hypercholesteremia   . Hypertension      Past Surgical History:  Procedure Laterality Date  .   COLONOSCOPY WITH PROPOFOL N/A 02/01/2020   Procedure: COLONOSCOPY WITH PROPOFOL;  Surgeon: Vanga, Rohini Reddy, MD;  Location: [facility] ENDOSCOPY;  Service: Gastroenterology;  Laterality: N/A;  .  ESOPHAGOGASTRODUODENOSCOPY  02/01/2020   Procedure: ESOPHAGOGASTRODUODENOSCOPY (EGD);  Surgeon: Vanga, Rohini Reddy, MD;  Location: [facility] ENDOSCOPY;  Service: Gastroenterology;;  . KNEE SURGERY Right    Torn meniscus  . KNEE SURGERY Left     Social History   Socioeconomic History  . Marital status: Significant Other    Spouse name: Not on file  . Number of children: 3  . Years of education: Not on file  . 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 Smoking status: Never Smoker  . Smokeless tobacco: Never Used  Vaping Use  . Vaping Use: Never used  Substance and Sexual Activity  . Alcohol use: Not Currently    Alcohol/week: 0.0 - 1.0 standard drinks  . Drug use: Yes    Types: Marijuana  . Sexual activity: Yes    Birth control/protection: None  Other Topics Concern  . Not on file  Social History Narrative  . Not on file   Social Determinants of Health   Financial Resource Strain: Low Risk   . Difficulty of Paying Living Expenses: Not hard at all  Food Insecurity: No Food Insecurity  . Worried About Running Out of Food in the Last Year: Never true  . Ran Out of Food in the Last Year: Never true  Transportation Needs: No Transportation Needs  . Lack of Transportation (Medical): No  . Lack of Transportation (Non-Medical): No  Physical Activity: Insufficiently Active  . Days of Exercise per Week: 7 days  . Minutes of Exercise per Session: 20 min  Stress: No Stress Concern Present  . Feeling of Stress : Not at all  Social Connections: Moderately Isolated  . Frequency of Communication with Friends and Family: More than three times a week  . Frequency of Social Gatherings with Friends and Family: More than three times a week  . Attends Religious Services: Never  . Active Member of Clubs or Organizations: No  . Attends Club or Organization Meetings: Never  . Marital Status: Living with partner   Intimate Partner Violence: Not At Risk  . Fear of Current or Ex-Partner: No  . Emotionally Abused: No  . Physically Abused: No  . Sexually Abused: No    Family History  Problem Relation Age of Onset  . Heart attack Father   . Hypertension Sister   . Cancer Sister      Current Outpatient Medications:  .  amLODipine (NORVASC) 10 MG tablet, Take 1 tablet (10 mg total) by mouth daily., Disp: 90 tablet, Rfl: 1 .  aspirin 81 MG chewable tablet, Chew 1 tablet by mouth daily., Disp: , Rfl:  .  insulin aspart (NOVOLOG) 100 UNIT/ML injection, Inject up to 14 units three times daily before meals according to sliding scale, Disp: 10 mL, Rfl: 5 .  insulin degludec (TRESIBA FLEXTOUCH) 100 UNIT/ML FlexTouch Pen, INJECT 30 UNITS SUBCUTANEOUSLY ONCE DAILY. INCREASE AS DIRECTED TO KEEP FASTING GLUCOSE LESS THAN 120., Disp: 3 mL, Rfl: 0 .  lovastatin (MEVACOR) 40 MG tablet, Take 1 tablet (40 mg total) by mouth at bedtime., Disp: 90 tablet, Rfl: 4 .  methocarbamol (ROBAXIN) 500 MG tablet, Take 1 tablet (500 mg total) by mouth 3 (three) times daily., Disp: 15 tablet, Rfl: 0 .  omeprazole (PRILOSEC) 40   MG capsule, Take 1 capsule (40 mg total) by mouth 2 (two) times daily before a meal., Disp: 60 capsule, Rfl: 2 .  pregabalin (LYRICA) 75 MG capsule, Take 1 capsule (75 mg total) by mouth 2 (two) times daily., Disp: 60 capsule, Rfl: 3  Physical exam:  Physical Exam Constitutional:      General: He is not in acute distress. Eyes:     Extraocular Movements: EOM normal.  Cardiovascular:     Rate and Rhythm: Normal rate and regular rhythm.     Heart sounds: Normal heart sounds.  Pulmonary:     Effort: Pulmonary effort is normal.     Breath sounds: Normal breath sounds.  Abdominal:     General: Bowel sounds are normal.     Palpations: Abdomen is soft.  Musculoskeletal:     Comments: Left hand swelling  Skin:    General: Skin is warm and dry.  Neurological:     Mental Status: He is alert and oriented  to person, place, and time.      CMP Latest Ref Rng & Units 04/08/2020  Glucose 70 - 99 mg/dL 159(H)  BUN 8 - 23 mg/dL 26(H)  Creatinine 0.61 - 1.24 mg/dL 1.29(H)  Sodium 135 - 145 mmol/L 134(L)  Potassium 3.5 - 5.1 mmol/L 4.4  Chloride 98 - 111 mmol/L 101  CO2 22 - 32 mmol/L 26  Calcium 8.9 - 10.3 mg/dL 8.4(L)  Total Protein 6.5 - 8.1 g/dL 9.1(H)  Total Bilirubin 0.3 - 1.2 mg/dL 0.4  Alkaline Phos 38 - 126 U/L 50  AST 15 - 41 U/L 19  ALT 0 - 44 U/L 19   CBC Latest Ref Rng & Units 04/08/2020  WBC 4.0 - 10.5 K/uL 2.9(L)  Hemoglobin 13.0 - 17.0 g/dL 10.5(L)  Hematocrit 39.0 - 52.0 % 31.2(L)  Platelets 150 - 400 K/uL 191    No images are attached to the encounter.  DG Hand Complete Left  Result Date: 04/23/2020 CLINICAL DATA:  Pain status post fall. EXAM: LEFT HAND - COMPLETE 3+ VIEW COMPARISON:  None. FINDINGS: There is a nondisplaced fracture through the distal radius. There is surrounding soft tissue swelling. There is no dislocation. Mild degenerative changes are noted of the hand. IMPRESSION: Nondisplaced fracture through the distal radius with surrounding soft tissue swelling. No dislocation. Mild degenerative changes of the hand. Electronically Signed   By: Christopher  Green M.D.   On: 04/23/2020 20:00     Assessment and plan- Patient is a 69 y.o. male referred for normocytic anemia here to discuss the results of blood work  Patient's anemia has remained stable around 10 for the last 1 year.He had an elevated serum creatinine of 3.95 months ago likely secondary to uncontrolled diabetes which had come down to 1.2 recently.  He does have an elevated total protein and myeloma panel revealed an elevated IgG level of 06/04/2004 with an M protein of 3.1 g.  Serum free light chain ratio was also abnormal at 25.  Based on these labs he at least has an MGUS.  He definitely needs a bone marrow biopsy to rule out smoldering multiple myeloma or overt multiple myeloma.  I would also like him  to get a PET CT scan As a part of staging work-up for myeloma as well as 24-hour urine protein electrophoresis.  I will see him back after these tests are done  Patient had a recent fall and reports pain and swelling in his left wrist and I will order x-rays   to rule out fracture   Visit Diagnosis 1. Normocytic anemia   2. Abnormal laboratory test result      Dr.  , MD, MPH [facility] [phone] 04/25/2020 1:16 PM                

## 2020-04-25 NOTE — Progress Notes (Signed)
Patient on schedule for BMB 04/29/2020,called and spoke with patient's fiance with pre procedure instructions given. Made aware to be here @ 0830, NPO after Mn prior to procedure, and driver for discharge/recovery post procedure. Stated understanding.

## 2020-04-26 ENCOUNTER — Other Ambulatory Visit: Payer: Self-pay | Admitting: Student

## 2020-04-26 ENCOUNTER — Other Ambulatory Visit: Payer: Self-pay

## 2020-04-26 DIAGNOSIS — S52532A Colles' fracture of left radius, initial encounter for closed fracture: Secondary | ICD-10-CM | POA: Diagnosis not present

## 2020-04-26 DIAGNOSIS — R899 Unspecified abnormal finding in specimens from other organs, systems and tissues: Secondary | ICD-10-CM

## 2020-04-26 DIAGNOSIS — D649 Anemia, unspecified: Secondary | ICD-10-CM

## 2020-04-29 ENCOUNTER — Ambulatory Visit
Admission: RE | Admit: 2020-04-29 | Discharge: 2020-04-29 | Disposition: A | Discharge status: Home / Self Care | Payer: Medicare HMO | Source: Ambulatory Visit | Attending: Oncology | Admitting: Oncology

## 2020-04-29 ENCOUNTER — Other Ambulatory Visit: Payer: Self-pay

## 2020-04-29 DIAGNOSIS — R899 Unspecified abnormal finding in specimens from other organs, systems and tissues: Secondary | ICD-10-CM | POA: Diagnosis not present

## 2020-04-29 DIAGNOSIS — D72819 Decreased white blood cell count, unspecified: Secondary | ICD-10-CM | POA: Diagnosis not present

## 2020-04-29 DIAGNOSIS — D649 Anemia, unspecified: Secondary | ICD-10-CM

## 2020-04-29 DIAGNOSIS — C9000 Multiple myeloma not having achieved remission: Secondary | ICD-10-CM | POA: Diagnosis present

## 2020-04-29 DIAGNOSIS — C9030 Solitary plasmacytoma not having achieved remission: Secondary | ICD-10-CM | POA: Insufficient documentation

## 2020-04-29 LAB — CBC WITH DIFFERENTIAL/PLATELET
Abs Immature Granulocytes: 0.01 10*3/uL (ref 0.00–0.07)
Basophils Absolute: 0 10*3/uL (ref 0.0–0.1)
Basophils Relative: 0 %
Eosinophils Absolute: 0 10*3/uL (ref 0.0–0.5)
Eosinophils Relative: 1 %
HCT: 28.1 % — ABNORMAL LOW (ref 39.0–52.0)
Hemoglobin: 9.3 g/dL — ABNORMAL LOW (ref 13.0–17.0)
Immature Granulocytes: 0 %
Lymphocytes Relative: 23 %
Lymphs Abs: 0.8 10*3/uL (ref 0.7–4.0)
MCH: 30.8 pg (ref 26.0–34.0)
MCHC: 33.1 g/dL (ref 30.0–36.0)
MCV: 93 fL (ref 80.0–100.0)
Monocytes Absolute: 0.5 10*3/uL (ref 0.1–1.0)
Monocytes Relative: 13 %
Neutro Abs: 2.3 10*3/uL (ref 1.7–7.7)
Neutrophils Relative %: 63 %
Platelets: 248 10*3/uL (ref 150–400)
RBC: 3.02 MIL/uL — ABNORMAL LOW (ref 4.22–5.81)
RDW: 12.8 % (ref 11.5–15.5)
WBC: 3.6 10*3/uL — ABNORMAL LOW (ref 4.0–10.5)
nRBC: 0 % (ref 0.0–0.2)

## 2020-04-29 LAB — UPEP/TP, 24-HR URINE
Albumin, U: 24.2 %
Alpha 1, Urine: 3.5 %
Alpha 2, Urine: 18.8 %
Beta, Urine: 33.7 %
Gamma Globulin, Urine: 19.8 %
M-Spike, mg/24 hr: 12.2 mg/24 hr — ABNORMAL HIGH
M-spike, %: 9.3 % — ABNORMAL HIGH
Total Protein, Urine-Ur/day: 131 mg/24 hr (ref 30–150)
Total Protein, Urine: 14.6 mg/dL
Total Volume: 900

## 2020-04-29 LAB — GLUCOSE, CAPILLARY: Glucose-Capillary: 56 mg/dL — ABNORMAL LOW (ref 70–99)

## 2020-04-29 IMAGING — CT CT BIOPSY AND ASPIRATION BONE MARROW
1 of 2 series · 10 of 14 positions shown, 13 images · non-contrast
Comparison: none

INDICATION: MYELOMA WORKUP, ANEMIA

[Series 2: i-spiral 5.0 b30f · axial · 0.59mm/px · z∈[-136,-52]mm · 10 of 30 slices shown, 13 images]
[im 3/30  soft-tissue]
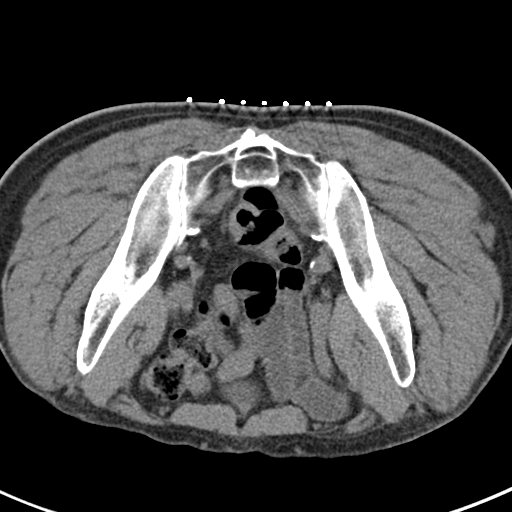
[im 3/30  bone]
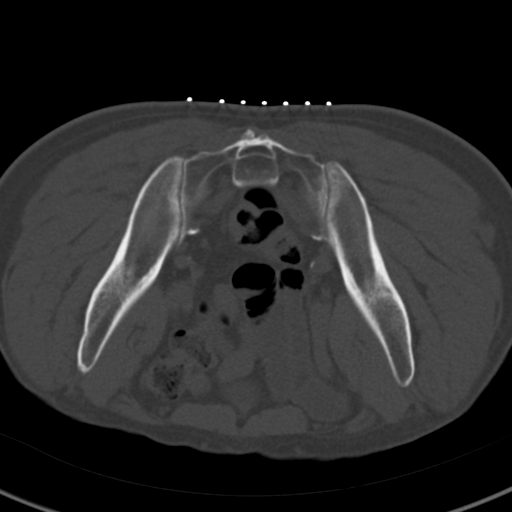
[im 6/30  bone]
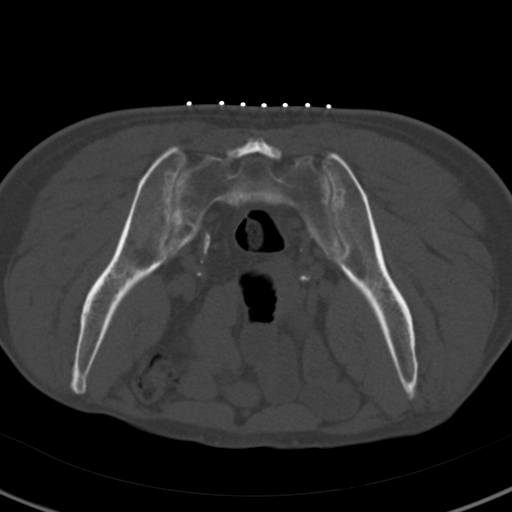
[im 8/30  bone]
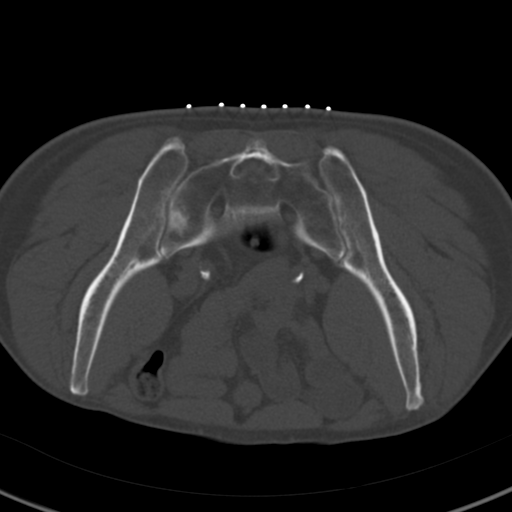
[im 11/30  bone]
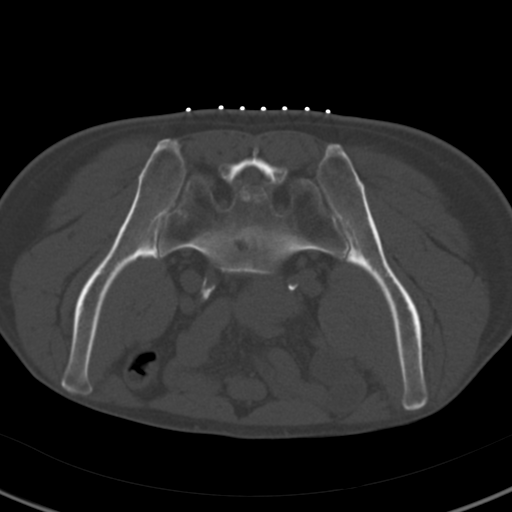
[im 14/30  soft-tissue]
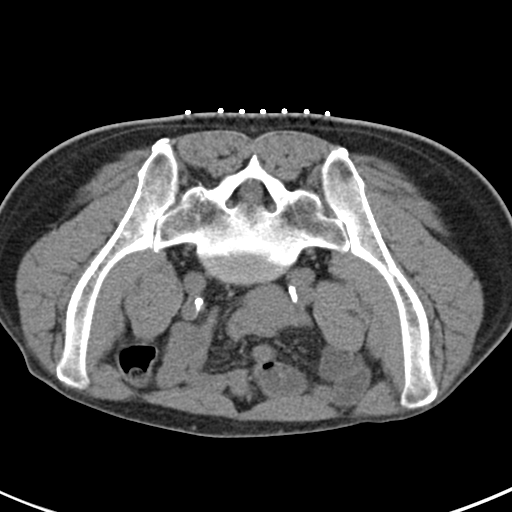
[im 14/30  bone]
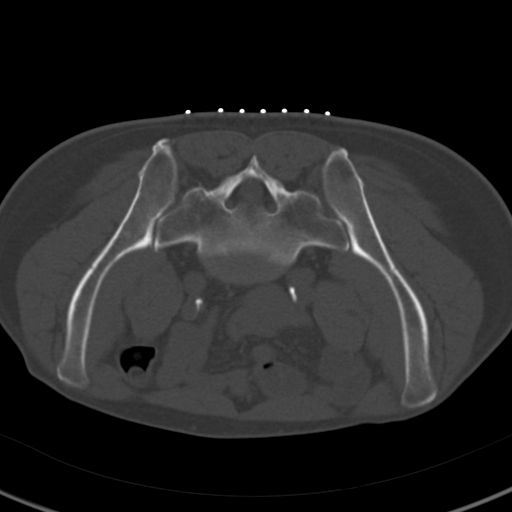
[im 16/30  bone]
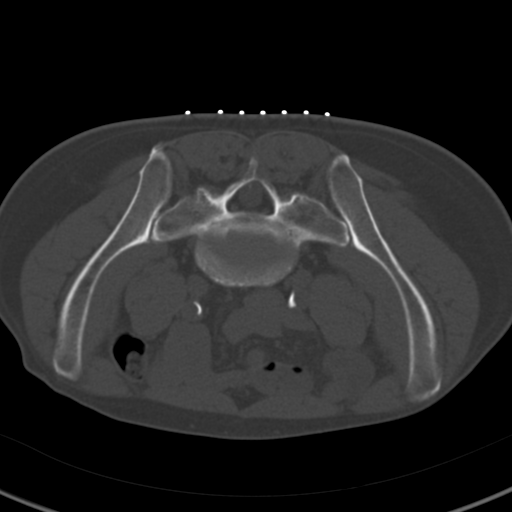
[im 19/30  bone]
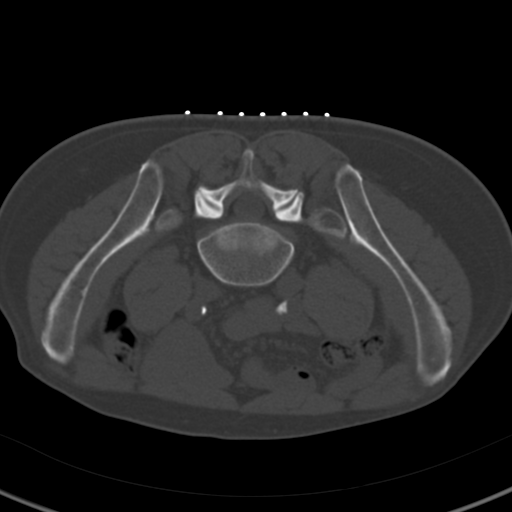
[im 22/30  bone]
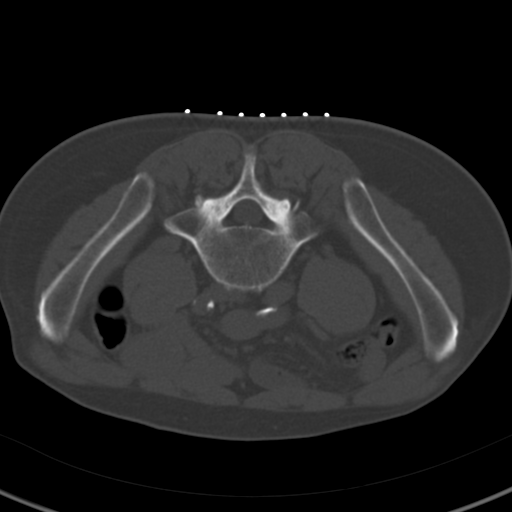
[im 24/30  soft-tissue]
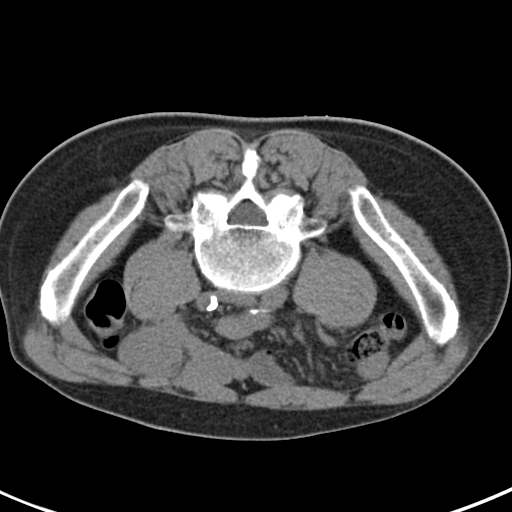
[im 24/30  bone]
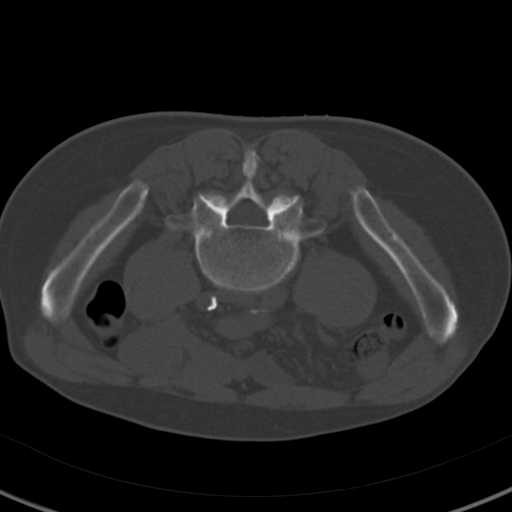
[im 27/30  bone]
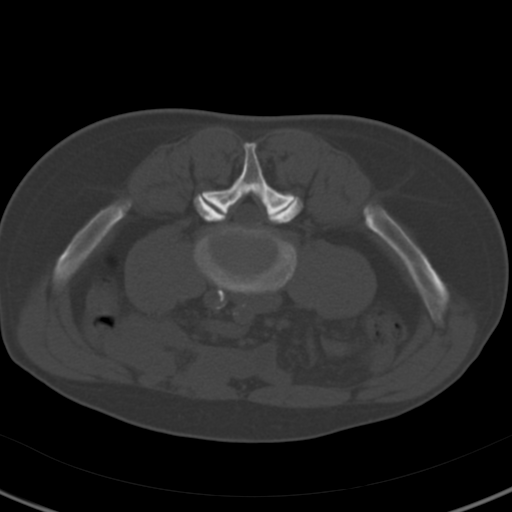

[10 of 14 positions shown; findings below may reference images not displayed]

EXAM:
CT GUIDED RIGHT ILIAC BONE MARROW ASPIRATION AND CORE BIOPSY

Radiologist:  ALFEREZ

Guidance:  CT

FLUOROSCOPY TIME:  Fluoroscopy Time: None.

MEDICATIONS:
1% lidocaine local

ANESTHESIA/SEDATION:
2.0 mg IV Versed; 100 mcg IV Fentanyl

Moderate Sedation Time:  10 minutes

The patient was continuously monitored during the procedure by the
interventional radiology nurse under my direct supervision.

CONTRAST:  None.

COMPLICATIONS:
None

PROCEDURE:
Informed consent was obtained from the patient following explanation
of the procedure, risks, benefits and alternatives. The patient
understands, agrees and consents for the procedure. All questions
were addressed. A time out was performed.

The patient was positioned prone and non-contrast localization CT
was performed of the pelvis to demonstrate the iliac marrow spaces.

Maximal barrier sterile technique utilized including caps, mask,
sterile gowns, sterile gloves, large sterile drape, hand hygiene,
and Betadine prep.

Under sterile conditions and local anesthesia, an 11 gauge coaxial
bone biopsy needle was advanced into the right iliac marrow space.
Needle position was confirmed with CT imaging. Initially, bone
marrow aspiration was performed. Next, the 11 gauge outer cannula
was utilized to obtain a right iliac bone marrow core biopsy. Needle
was removed. Hemostasis was obtained with compression. The patient
tolerated the procedure well. Samples were prepared with the
cytotechnologist. No immediate complications.
IMPRESSION: CT guided right iliac bone marrow aspiration and core biopsy.

## 2020-04-29 MED ORDER — SODIUM CHLORIDE 0.9 % IV SOLN: INTRAVENOUS | Status: DC

## 2020-04-29 MED ORDER — MIDAZOLAM HCL 2 MG/2ML IJ SOLN
INTRAMUSCULAR | Status: AC
  Filled 2020-04-29: qty 2

## 2020-04-29 MED ORDER — FENTANYL CITRATE (PF) 100 MCG/2ML IJ SOLN
INTRAMUSCULAR | Status: AC
  Filled 2020-04-29: qty 2

## 2020-04-29 MED ORDER — MIDAZOLAM HCL 2 MG/2ML IJ SOLN
INTRAMUSCULAR | Status: AC | PRN
  Administered 2020-04-29 (×2): 1 mg via INTRAVENOUS

## 2020-04-29 MED ORDER — FENTANYL CITRATE (PF) 100 MCG/2ML IJ SOLN
INTRAMUSCULAR | Status: AC | PRN
  Administered 2020-04-29 (×2): 50 ug via INTRAVENOUS

## 2020-04-29 MED ORDER — HEPARIN SOD (PORK) LOCK FLUSH 100 UNIT/ML IV SOLN
INTRAVENOUS | Status: AC
  Filled 2020-04-29: qty 5

## 2020-04-29 NOTE — OR Nursing (Signed)
CBG 56. PA Anderson Malta made aware, no new orders. PT asymptomatic at this time.

## 2020-04-29 NOTE — Progress Notes (Signed)
Patient clinically stable post BMB per Dr Annamaria Boots, tolerated well. Awake/alert and oriented post procedure. Received Versed 2 mg along with Fentanyl 100 mcg IV for procedure. Denies complaints at this time. Dressing to posterior sacral area/dry/intact. Discharge instructions given to patient with questions answered.

## 2020-04-29 NOTE — H&P (Signed)
Chief Complaint: Normocytic anemia with abnormal labs. Request is for bone marrow biopsy.  Referring Physician(s): Rao,Archana C  Supervising Physician: Daryll Brod  Patient Status: [facility] - Out-pt  History of Present Illness: Nicholas Weiss is a 70 y.o. male History of DM type 1, HLD,CKD, normocytic anemia. Found to have an elevated total protein and an elevated myeloma panel . Team is requesting a bone marrow biopsy to rule out multiple myeloma.  Currently without any significant complaints. Patient alert and laying in bed, calm and comfortable. Denies any fevers, headache, chest pain, SOB, cough, abdominal pain, nausea, vomiting or bleeding. Return precautions and treatment recommendations and follow-up discussed with the patient who is agreeable with the plan.  Past Medical History:  Diagnosis Date  . DKA (diabetic ketoacidoses) 04/06/2016  . Hypercholesteremia   . Hypertension     Past Surgical History:  Procedure Laterality Date  . COLONOSCOPY WITH PROPOFOL N/A 02/01/2020   Procedure: COLONOSCOPY WITH PROPOFOL;  Surgeon: Lin Landsman, MD;  Location[facility] ENDOSCOPY;  Service: Gastroenterology;  Laterality: N/A;  . ESOPHAGOGASTRODUODENOSCOPY  02/01/2020   Procedure: ESOPHAGOGASTRODUODENOSCOPY (EGD);  Surgeon: Lin Landsman, MD;  Location: [facility] ENDOSCOPY;  Service: Gastroenterology;;  . KNEE SURGERY Right    Torn meniscus  . KNEE SURGERY Left     Allergies: Penicillins  Medications: Prior to Admission medications   Medication Sig Start Date End Date Taking? Authorizing Provider  amLODipine (NORVASC) 10 MG tablet Take 1 tablet (10 mg total) by mouth daily. 09/18/19  Yes Birdie Sons, MD  aspirin 81 MG chewable tablet Chew 1 tablet by mouth daily.   Yes [provider]  insulin aspart (NOVOLOG) 100 UNIT/ML injection Inject up to 14 units three times daily before meals according to sliding scale 12/26/19  Yes Fisher, Kirstie Peri, MD  lovastatin  (MEVACOR) 40 MG tablet Take 1 tablet (40 mg total) by mouth at bedtime. 08/02/18  Yes Birdie Sons, MD  methocarbamol (ROBAXIN) 500 MG tablet Take 1 tablet (500 mg total) by mouth 3 (three) times daily. 03/20/20  Yes Laban Emperor, PA-C  pregabalin (LYRICA) 75 MG capsule Take 1 capsule (75 mg total) by mouth 2 (two) times daily. 12/25/19  Yes Birdie Sons, MD  insulin degludec (TRESIBA FLEXTOUCH) 100 UNIT/ML FlexTouch Pen INJECT 30 UNITS SUBCUTANEOUSLY ONCE DAILY. INCREASE AS DIRECTED TO KEEP FASTING GLUCOSE LESS THAN 120. 04/24/20   Birdie Sons, MD  omeprazole (PRILOSEC) 40 MG capsule Take 1 capsule (40 mg total) by mouth 2 (two) times daily before a meal. 02/01/20 03/02/20  Lin Landsman, MD     Family History  Problem Relation Age of Onset  . Heart attack Father   . Hypertension Sister   . Cancer Sister     Social History   Socioeconomic History  . Marital status: Significant Other    Spouse name: Not on file  . Number of children: 3  . Years of education: Not on file  . 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 Smoking status: Never Smoker  . Smokeless tobacco: Never Used  Vaping Use  . Vaping Use: Never used  Substance and Sexual Activity  . Alcohol use: Yes    Alcohol/week: 0.0 - 1.0 standard drinks    Comment: "once every 2 months"  . Drug use: Not Currently    Types: Marijuana    Comment: "none in a long time'  .  Sexual activity: Yes    Birth control/protection: None  Other Topics Concern  . Not on file  Social History Narrative  . Not on file   Social Determinants of Health   Financial Resource Strain: Low Risk   . Difficulty of Paying Living Expenses: Not hard at all  Food Insecurity: No Food Insecurity  . Worried About Charity fundraiser in the Last Year: Never true  . Ran Out of Food in the Last Year: Never true  Transportation Needs: No Transportation Needs  .  Lack of Transportation (Medical): No  . Lack of Transportation (Non-Medical): No  Physical Activity: Insufficiently Active  . Days of Exercise per Week: 7 days  . Minutes of Exercise per Session: 20 min  Stress: No Stress Concern Present  . Feeling of Stress : Not at all  Social Connections: Moderately Isolated  . Frequency of Communication with Friends and Family: More than three times a week  . Frequency of Social Gatherings with Friends and Family: More than three times a week  . Attends Religious Services: Never  . Active Member of Clubs or Organizations: No  . Attends Archivist Meetings: Never  . Marital Status: Living with partner    Review of Systems: A 12 point ROS discussed and pertinent positives are indicated in the HPI above.  All other systems are negative.  Review of Systems  Constitutional: Negative for fever.  HENT: Negative for congestion.   Respiratory: Negative for cough and shortness of breath.   Cardiovascular: Negative for chest pain.  Gastrointestinal: Negative for abdominal pain.  Neurological: Negative for headaches.  Psychiatric/Behavioral: Negative for behavioral problems and confusion.    Vital Signs: BP (!) 154/97   Pulse 87   Temp 98.3 F (36.8 C) (Oral)   Resp 18   Ht 5' 10.5" (1.791 m)   Wt 166 lb (75.3 kg)   SpO2 100%   BMI 23.48 kg/m   Physical Exam Vitals and nursing note reviewed.  Constitutional:      Appearance: He is well-developed and well-nourished.  HENT:     Head: Normocephalic.  Cardiovascular:     Rate and Rhythm: Normal rate and regular rhythm.     Heart sounds: Normal heart sounds.  Pulmonary:     Effort: Pulmonary effort is normal.     Breath sounds: Normal breath sounds.  Musculoskeletal:        General: Normal range of motion.     Cervical back: Normal range of motion.     Comments: Cast noted to left arm.   Skin:    General: Skin is dry.  Neurological:     Mental Status: He is alert and oriented  to person, place, and time.  Psychiatric:        Mood and Affect: Mood and affect normal.     Imaging: DG Hand Complete Left  Result Date: 04/23/2020 CLINICAL DATA:  Pain status post fall. EXAM: LEFT HAND - COMPLETE 3+ VIEW COMPARISON:  None. FINDINGS: There is a nondisplaced fracture through the distal radius. There is surrounding soft tissue swelling. There is no dislocation. Mild degenerative changes are noted of the hand. IMPRESSION: Nondisplaced fracture through the distal radius with surrounding soft tissue swelling. No dislocation. Mild degenerative changes of the hand. Electronically Signed   By: Constance Holster M.D.   On: 04/23/2020 20:00    Labs:  CBC: Recent Labs    11/06/19 1652 11/07/19 0427 03/15/20 1056 04/08/20 1200  WBC 8.2 8.1 4.4  2.9*  HGB 9.9* 10.4* 10.0* 10.5*  HCT 31.6* 30.7* 30.4* 31.2*  PLT 265 265 205 191    COAGS: No results for input(s): INR, APTT in the last 8760 hours.  BMP: Recent Labs    11/07/19 0046 11/07/19 0427 11/07/19 0549 03/15/20 1056 04/08/20 1200  NA 137 141 143 135 134*  K 4.1 4.0 4.2 5.3* 4.4  CL 104 107 107 101 101  CO2 22 23 24 20 26   GLUCOSE 496* 172* 148* 301* 159*  BUN 84* 81* 82* 26 26*  CALCIUM 8.2* 8.4* 8.9 8.5* 8.4*  CREATININE 4.85* 4.07* 3.97* 1.53* 1.29*  GFRNONAA 12* 14* 15* 47* >60  GFRAA 13* 17* 17* 54*  --     LIVER FUNCTION TESTS: Recent Labs    07/27/19 2232 08/14/19 1033 11/06/19 2000 03/15/20 1056 04/08/20 1200  BILITOT 1.0  --  1.5* <0.2 0.4  AST 18  --  16 20 19   ALT 15  --  15 20 19   ALKPHOS 61  --  54 63 50  PROT 8.7*  --  8.4* 8.1  8.3 9.1*  ALBUMIN 3.4* 3.6* 3.6 3.5* 3.6    Assessment and Plan:  70 y.o. male outpatient. History of DM type 1, HLD,CKD, normocytic anemia. Found to have an elevated total protein and an elevated myeloma panel . Team is requesting a bone marrow biopsy to rule out multiple myeloma.   All labs and medications are within acceptable parameters.  Allergies include PCN. Patient has been NPO since midnight.  Risks and benefits of bone marrow biopsy was discussed with the patient and/or patient's family including, but not limited to bleeding, infection, damage to adjacent structures or low yield requiring additional tests.  All of the questions were answered and there is agreement to proceed.  Consent signed and in chart.    Thank you for this interesting consult.  I greatly enjoyed meeting TRISTRAM MILIAN and look forward to participating in their care.  A copy of this report was sent to the requesting provider on this date.  Electronically Signed: Jacqualine Mau, NP 04/29/2020, 8:26 AM   I spent a total of  30 Minutes   in face to face in clinical consultation, greater than 50% of which was counseling/coordinating care for bone marrow biopsy.

## 2020-04-29 NOTE — Procedures (Signed)
Interventional Radiology Procedure Note  Procedure: CT RIGHT ILIAC BM ASP AND CORE BX    Complications: None  Estimated Blood Loss:  MIN  Findings: 11 G CORE AND ASP    M. Daryll Brod, MD

## 2020-04-29 NOTE — Discharge Instructions (Signed)
Moderate Conscious Sedation, Adult, Care After This sheet gives you information about how to care for yourself after your procedure. Your health care provider may also give you more specific instructions. If you have problems or questions, contact your health care provider. What can I expect after the procedure? After the procedure, it is common to have:  Sleepiness for several hours.  Impaired judgment for several hours.  Difficulty with balance.  Vomiting if you eat too soon. Follow these instructions at home: For the time period you were told by your health care provider:  Rest.  Do not participate in activities where you could fall or become injured.  Do not drive or use machinery.  Do not drink alcohol.  Do not take sleeping pills or medicines that cause drowsiness.  Do not make important decisions or sign legal documents.  Do not take care of children on your own.      Eating and drinking  Follow the diet recommended by your health care provider.  Drink enough fluid to keep your urine pale yellow.  If you vomit: ? Drink water, juice, or soup when you can drink without vomiting. ? Make sure you have little or no nausea before eating solid foods.   General instructions  Take over-the-counter and prescription medicines only as told by your health care provider.  Have a responsible adult stay with you for the time you are told. It is important to have someone help care for you until you are awake and alert.  Do not smoke.  Keep all follow-up visits as told by your health care provider. This is important. Contact a health care provider if:  You are still sleepy or having trouble with balance after 24 hours.  You feel light-headed.  You keep feeling nauseous or you keep vomiting.  You develop a rash.  You have a fever.  You have redness or swelling around the IV site. Get help right away if:  You have trouble breathing.  You have new-onset confusion at  home. Summary  After the procedure, it is common to feel sleepy, have impaired judgment, or feel nauseous if you eat too soon.  Rest after you get home. Know the things you should not do after the procedure.  Follow the diet recommended by your health care provider and drink enough fluid to keep your urine pale yellow.  Get help right away if you have trouble breathing or new-onset confusion at home. This information is not intended to replace advice given to you by your health care provider. Make sure you discuss any questions you have with your health care provider. Document Revised: 07/14/2019 Document Reviewed: 02/09/2019 Elsevier Patient Education  2021 Elsevier Inc. Bone Marrow Aspiration and Bone Marrow Biopsy, Adult, Care After This sheet gives you information about how to care for yourself after your procedure. Your health care provider may also give you more specific instructions. If you have problems or questions, contact your health care provider. What can I expect after the procedure? After the procedure, it is common to have:  Mild pain and tenderness.  Swelling.  Bruising. Follow these instructions at home: Puncture site care  Follow instructions from your health care provider about how to take care of the puncture site. Make sure you: ? Wash your hands with soap and water before and after you change your bandage (dressing). If soap and water are not available, use hand sanitizer. ? Change your dressing as told by your health care provider.  Check   your puncture site every day for signs of infection. Check for: ? More redness, swelling, or pain. ? Fluid or blood. ? Warmth. ? Pus or a bad smell.   Activity  Return to your normal activities as told by your health care provider. Ask your health care provider what activities are safe for you.  Do not lift anything that is heavier than 10 lb (4.5 kg), or the limit that you are told, until your health care provider  says that it is safe.  Do not drive for 24 hours if you were given a sedative during your procedure. General instructions  Take over-the-counter and prescription medicines only as told by your health care provider.  Do not take baths, swim, or use a hot tub until your health care provider approves. Ask your health care provider if you may take showers. You may only be allowed to take sponge baths.  If directed, put ice on the affected area. To do this: ? Put ice in a plastic bag. ? Place a towel between your skin and the bag. ? Leave the ice on for 20 minutes, 2-3 times a day.  Keep all follow-up visits as told by your health care provider. This is important.   Contact a health care provider if:  Your pain is not controlled with medicine.  You have a fever.  You have more redness, swelling, or pain around the puncture site.  You have fluid or blood coming from the puncture site.  Your puncture site feels warm to the touch.  You have pus or a bad smell coming from the puncture site. Summary  After the procedure, it is common to have mild pain, tenderness, swelling, and bruising.  Follow instructions from your health care provider about how to take care of the puncture site and what activities are safe for you.  Take over-the-counter and prescription medicines only as told by your health care provider.  Contact a health care provider if you have any signs of infection, such as fluid or blood coming from the puncture site. This information is not intended to replace advice given to you by your health care provider. Make sure you discuss any questions you have with your health care provider. Document Revised: 08/02/2018 Document Reviewed: 08/02/2018 Elsevier Patient Education  2021 Elsevier Inc.  

## 2020-05-06 ENCOUNTER — Encounter (HOSPITAL_COMMUNITY): Payer: Self-pay | Admitting: Oncology

## 2020-05-07 ENCOUNTER — Encounter (HOSPITAL_COMMUNITY): Payer: Self-pay | Admitting: Oncology

## 2020-05-07 ENCOUNTER — Telehealth: Payer: Self-pay | Admitting: Oncology

## 2020-05-07 NOTE — Telephone Encounter (Signed)
Called pt to see if agreeable to change appt from 9:15 to 2:45 on 05/28/20. Patient confirmed change. Mailing updated AVS.

## 2020-05-10 LAB — SURGICAL PATHOLOGY

## 2020-05-14 ENCOUNTER — Ambulatory Visit
Admission: RE | Admit: 2020-05-14 | Discharge: 2020-05-14 | Disposition: A | Discharge status: Home / Self Care | Payer: Medicare HMO | Source: Ambulatory Visit | Attending: Oncology | Admitting: Oncology

## 2020-05-14 ENCOUNTER — Other Ambulatory Visit: Payer: Self-pay

## 2020-05-14 DIAGNOSIS — I700 Atherosclerosis of aorta: Secondary | ICD-10-CM | POA: Diagnosis not present

## 2020-05-14 DIAGNOSIS — R899 Unspecified abnormal finding in specimens from other organs, systems and tissues: Secondary | ICD-10-CM | POA: Diagnosis not present

## 2020-05-14 DIAGNOSIS — D649 Anemia, unspecified: Secondary | ICD-10-CM | POA: Diagnosis not present

## 2020-05-14 DIAGNOSIS — C9000 Multiple myeloma not having achieved remission: Secondary | ICD-10-CM | POA: Diagnosis not present

## 2020-05-14 LAB — GLUCOSE, CAPILLARY: Glucose-Capillary: 93 mg/dL (ref 70–99)

## 2020-05-14 IMAGING — PT NM PET TUM IMG INITIAL (PI) SKULL BASE T - THIGH
9 of 10 series · 19 of 25 positions shown · non-contrast
Comparison: CT abdomen [DATE]

CLINICAL DATA: Initial treatment strategy for multiple myeloma.

EXAM:
NUCLEAR MEDICINE PET SKULL BASE TO THIGH
TECHNIQUE: 9.4 mCi F-18 FDG was injected intravenously. Full-ring PET imaging
was performed from the skull base to thigh after the radiotracer. CT
data was obtained and used for attenuation correction and anatomic
localization.
Fasting blood glucose: 93 mg/dl

[Series 3: ct wb 5.0 b30f · axial · 5.0mm · 0.98mm/px · z∈[-234,+198]mm · 2 of 290 slices shown]
[im 1/290]
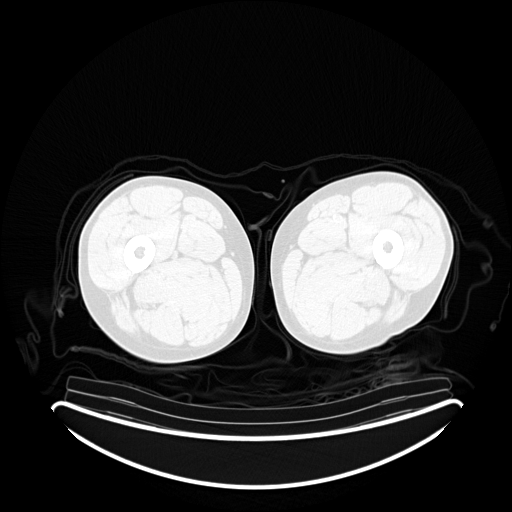
[im 145/290]
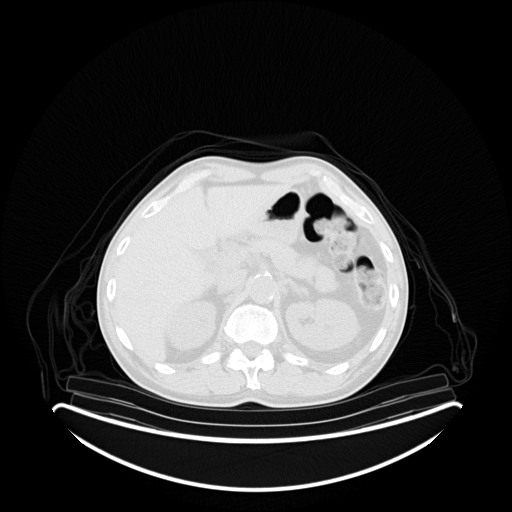

[Series 5: pet wb uncorrected (nac) · axial · 5.0mm · 4.07mm/px · z∈[-234,+632]mm · 3 of 290 slices shown]
[im 1/290]
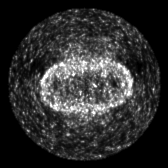
[im 145/290]
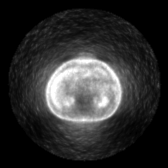
[im 290/290]
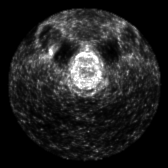

[Series 6: pet wb (ac) · axial · 5.0mm · 3.39mm/px · z∈[+54,+632]mm · 3 of 290 slices shown]
[im 97/290]
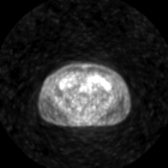
[im 193/290]
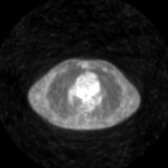
[im 290/290]
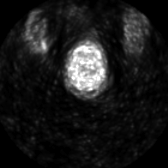

[Series 603: fused axial · 3 of 288 slices shown]
[im 96/288]
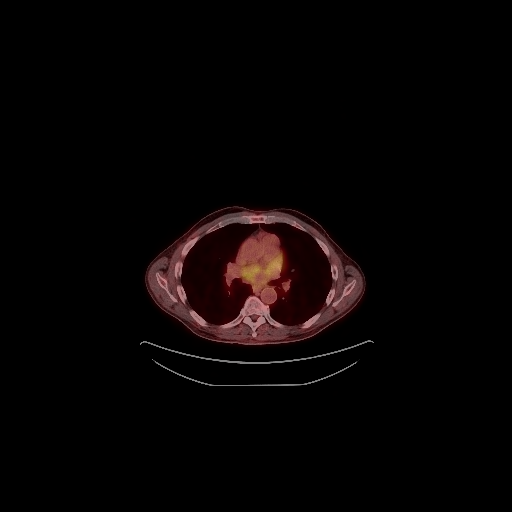
[im 192/288]
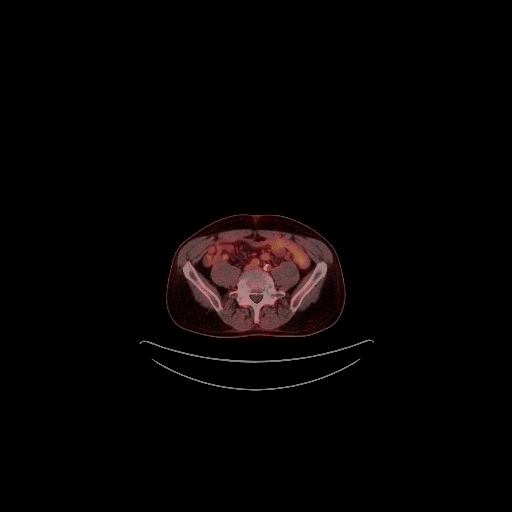
[im 288/288]
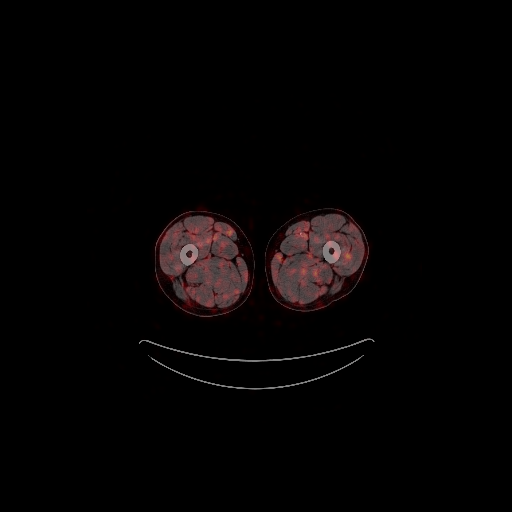

[Series 605: fused sagittal · 2 of 116 slices shown]
[im 1/116]
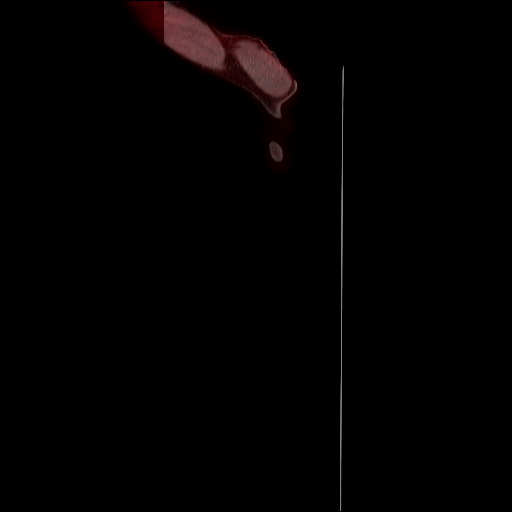
[im 116/116]
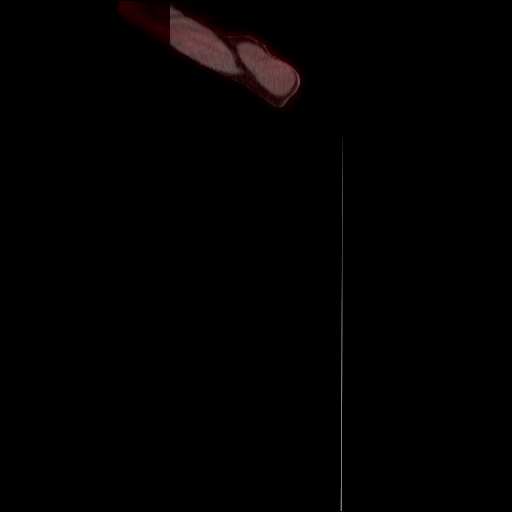

[Series 606: pet axial · 3 of 288 slices shown]
[im 1/288]
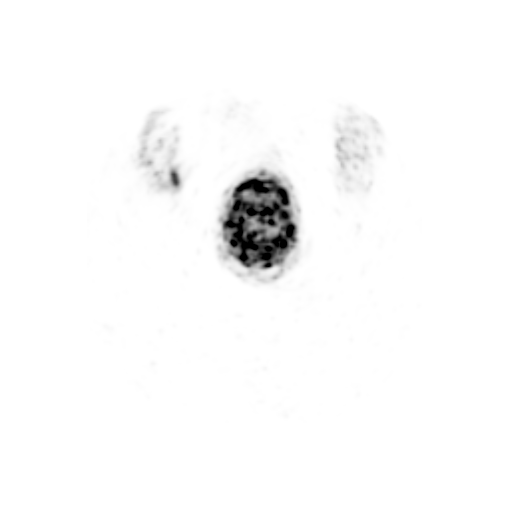
[im 192/288]
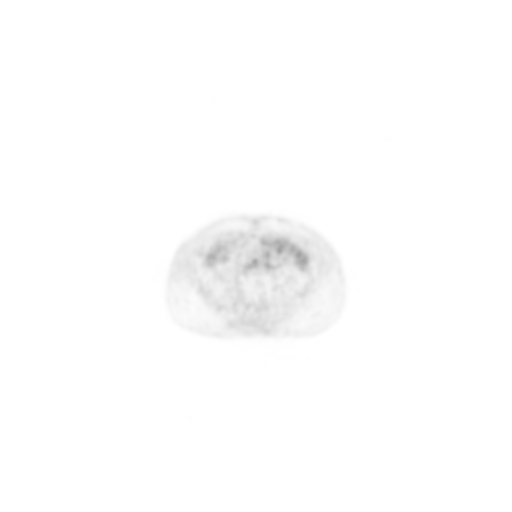
[im 288/288]
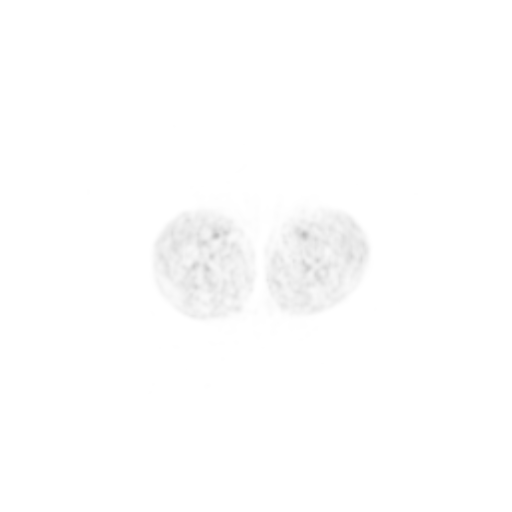

[Series 607: pet coronal · 1 of 103 slices shown]
[im 1/103]
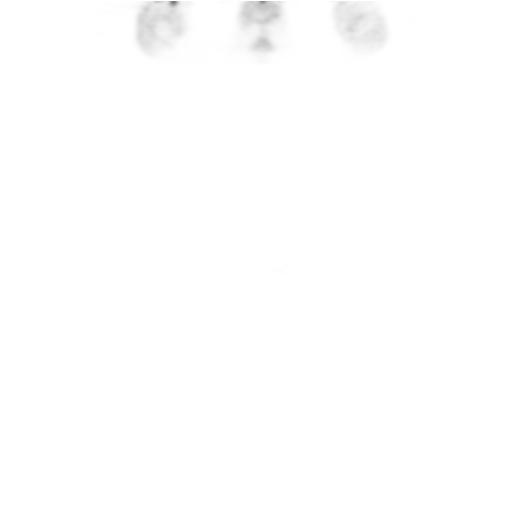

[Series 608: pet sagittal · 1 of 132 slices shown]
[im 132/132]
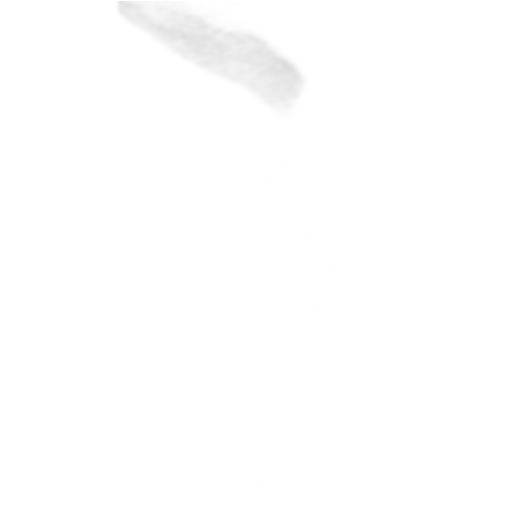

[Series 1060: results mm oncology reading · 1.0mm · 0.45mm/px · 1 of 1 slices shown]
[im 1/1]
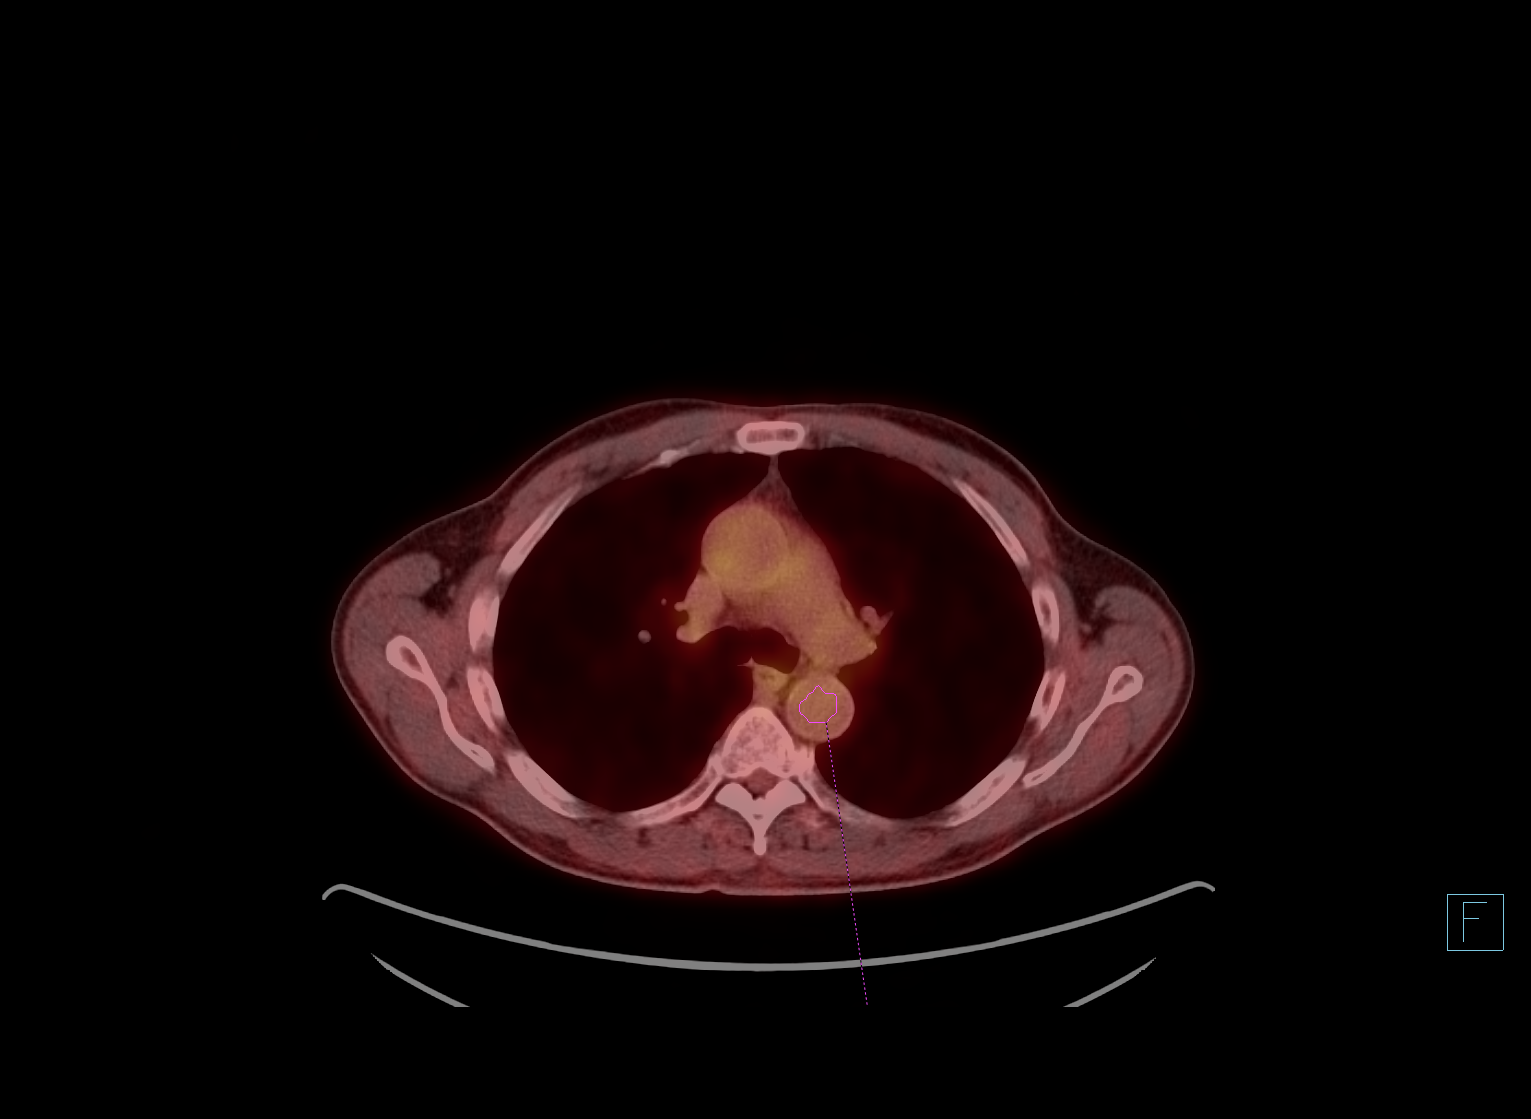

[19 of 25 positions shown; findings below may reference images not displayed]

FINDINGS: Mediastinal blood pool activity: SUV max

Liver activity: SUV max NA

NECK: No hypermetabolic lymph nodes in the neck.

Incidental CT findings: none

CHEST: No hypermetabolic mediastinal or hilar nodes. No suspicious
pulmonary nodules on the CT scan.

Incidental CT findings: Scattered aortic calcifications and
three-vessel coronary artery calcifications.

ABDOMEN/PELVIS: No abnormal hypermetabolic activity within the
liver, pancreas, adrenal glands, or spleen. No hypermetabolic lymph
nodes in the abdomen or pelvis.

Incidental CT findings: Age advanced atherosclerotic calcifications
involving the aorta and branch vessel ostia and iliac arteries. No
aneurysm.

SKELETON: No hypermetabolic bone lesions are identified. No definite
lytic myelomatous lesions are identified on the CT scan.

Incidental CT findings: none
IMPRESSION: No hypermetabolic lytic myelomatous lesions are identified.

## 2020-05-14 MED ORDER — FLUDEOXYGLUCOSE F - 18 (FDG) INJECTION
9.4000 | Freq: Once | INTRAVENOUS | Status: AC | PRN
  Administered 2020-05-14: 9.4 via INTRAVENOUS

## 2020-05-17 DIAGNOSIS — S52532A Colles' fracture of left radius, initial encounter for closed fracture: Secondary | ICD-10-CM | POA: Diagnosis not present

## 2020-05-28 ENCOUNTER — Ambulatory Visit: Payer: Medicare HMO | Admitting: Oncology

## 2020-05-28 ENCOUNTER — Encounter: Payer: Self-pay | Admitting: Oncology

## 2020-05-28 ENCOUNTER — Inpatient Hospital Stay: Payer: Medicare HMO

## 2020-05-28 ENCOUNTER — Inpatient Hospital Stay: Payer: Medicare HMO | Attending: Oncology | Admitting: Oncology

## 2020-05-28 VITALS — BP 156/86 | HR 88 | Wt 177.1 lb

## 2020-05-28 DIAGNOSIS — D631 Anemia in chronic kidney disease: Secondary | ICD-10-CM | POA: Diagnosis not present

## 2020-05-28 DIAGNOSIS — D638 Anemia in other chronic diseases classified elsewhere: Secondary | ICD-10-CM | POA: Insufficient documentation

## 2020-05-28 DIAGNOSIS — D472 Monoclonal gammopathy: Secondary | ICD-10-CM

## 2020-05-28 DIAGNOSIS — C9000 Multiple myeloma not having achieved remission: Secondary | ICD-10-CM

## 2020-05-28 DIAGNOSIS — N183 Chronic kidney disease, stage 3 unspecified: Secondary | ICD-10-CM | POA: Diagnosis not present

## 2020-05-28 LAB — CBC WITH DIFFERENTIAL/PLATELET
Abs Immature Granulocytes: 0 10*3/uL (ref 0.00–0.07)
Basophils Absolute: 0 10*3/uL (ref 0.0–0.1)
Basophils Relative: 0 %
Eosinophils Absolute: 0.1 10*3/uL (ref 0.0–0.5)
Eosinophils Relative: 2 %
HCT: 28.9 % — ABNORMAL LOW (ref 39.0–52.0)
Hemoglobin: 9.7 g/dL — ABNORMAL LOW (ref 13.0–17.0)
Immature Granulocytes: 0 %
Lymphocytes Relative: 31 %
Lymphs Abs: 1.1 10*3/uL (ref 0.7–4.0)
MCH: 31.7 pg (ref 26.0–34.0)
MCHC: 33.6 g/dL (ref 30.0–36.0)
MCV: 94.4 fL (ref 80.0–100.0)
Monocytes Absolute: 0.4 10*3/uL (ref 0.1–1.0)
Monocytes Relative: 11 %
Neutro Abs: 1.9 10*3/uL (ref 1.7–7.7)
Neutrophils Relative %: 56 %
Platelets: 251 10*3/uL (ref 150–400)
RBC: 3.06 MIL/uL — ABNORMAL LOW (ref 4.22–5.81)
RDW: 13.6 % (ref 11.5–15.5)
WBC: 3.4 10*3/uL — ABNORMAL LOW (ref 4.0–10.5)
nRBC: 0 % (ref 0.0–0.2)

## 2020-05-28 LAB — COMPREHENSIVE METABOLIC PANEL
ALT: 20 U/L (ref 0–44)
AST: 20 U/L (ref 15–41)
Albumin: 3.6 g/dL (ref 3.5–5.0)
Alkaline Phosphatase: 51 U/L (ref 38–126)
Anion gap: 10 (ref 5–15)
BUN: 33 mg/dL — ABNORMAL HIGH (ref 8–23)
CO2: 24 mmol/L (ref 22–32)
Calcium: 8.5 mg/dL — ABNORMAL LOW (ref 8.9–10.3)
Chloride: 102 mmol/L (ref 98–111)
Creatinine, Ser: 1.98 mg/dL — ABNORMAL HIGH (ref 0.61–1.24)
GFR, Estimated: 36 mL/min — ABNORMAL LOW (ref >60)
Glucose, Bld: 171 mg/dL — ABNORMAL HIGH (ref 70–99)
Potassium: 4.2 mmol/L (ref 3.5–5.1)
Sodium: 136 mmol/L (ref 135–145)
Total Bilirubin: 0.4 mg/dL (ref 0.3–1.2)
Total Protein: 8.7 g/dL — ABNORMAL HIGH (ref 6.5–8.1)

## 2020-05-29 DIAGNOSIS — N183 Chronic kidney disease, stage 3 unspecified: Secondary | ICD-10-CM | POA: Diagnosis not present

## 2020-05-29 DIAGNOSIS — D631 Anemia in chronic kidney disease: Secondary | ICD-10-CM | POA: Diagnosis not present

## 2020-05-29 DIAGNOSIS — C9000 Multiple myeloma not having achieved remission: Secondary | ICD-10-CM | POA: Diagnosis not present

## 2020-05-29 LAB — KAPPA/LAMBDA LIGHT CHAINS
Kappa free light chain: 13.8 mg/L (ref 3.3–19.4)
Kappa, lambda light chain ratio: 0.07 — ABNORMAL LOW (ref 0.26–1.65)
Lambda free light chains: 197.8 mg/L — ABNORMAL HIGH (ref 5.7–26.3)

## 2020-05-29 LAB — ERYTHROPOIETIN: Erythropoietin: 16.5 m[IU]/mL (ref 2.6–18.5)

## 2020-05-30 ENCOUNTER — Other Ambulatory Visit: Payer: Self-pay

## 2020-05-30 DIAGNOSIS — C9000 Multiple myeloma not having achieved remission: Secondary | ICD-10-CM

## 2020-05-30 DIAGNOSIS — D472 Monoclonal gammopathy: Secondary | ICD-10-CM

## 2020-05-30 LAB — MULTIPLE MYELOMA PANEL, SERUM
Albumin SerPl Elph-Mcnc: 3.6 g/dL (ref 2.9–4.4)
Albumin/Glob SerPl: 0.8 (ref 0.7–1.7)
Alpha 1: 0.2 g/dL (ref 0.0–0.4)
Alpha2 Glob SerPl Elph-Mcnc: 0.6 g/dL (ref 0.4–1.0)
B-Globulin SerPl Elph-Mcnc: 0.7 g/dL (ref 0.7–1.3)
Gamma Glob SerPl Elph-Mcnc: 3.3 g/dL — ABNORMAL HIGH (ref 0.4–1.8)
Globulin, Total: 4.9 g/dL — ABNORMAL HIGH (ref 2.2–3.9)
IgA: 25 mg/dL — ABNORMAL LOW (ref 61–437)
IgG (Immunoglobin G), Serum: 4230 mg/dL — ABNORMAL HIGH (ref 603–1613)
IgM (Immunoglobulin M), Srm: <5 mg/dL — ABNORMAL LOW (ref 20–172)
M Protein SerPl Elph-Mcnc: 3 g/dL — ABNORMAL HIGH
Total Protein ELP: 8.5 g/dL (ref 6.0–8.5)

## 2020-05-31 DIAGNOSIS — H2513 Age-related nuclear cataract, bilateral: Secondary | ICD-10-CM | POA: Diagnosis not present

## 2020-05-31 DIAGNOSIS — E113393 Type 2 diabetes mellitus with moderate nonproliferative diabetic retinopathy without macular edema, bilateral: Secondary | ICD-10-CM | POA: Diagnosis not present

## 2020-05-31 LAB — HM DIABETES EYE EXAM

## 2020-06-03 LAB — UPEP/TP, 24-HR URINE
Albumin, U: 51.4 %
Alpha 1, Urine: 1.6 %
Alpha 2, Urine: 9.1 %
Beta, Urine: 15.2 %
Gamma Globulin, Urine: 22.7 %
M-Spike, mg/24 hr: 60.9 mg/24 hr — ABNORMAL HIGH
M-spike, %: 16.8 % — ABNORMAL HIGH
Total Protein, Urine-Ur/day: 363 mg/24 hr — ABNORMAL HIGH (ref 30–150)
Total Protein, Urine: 27.9 mg/dL
Total Volume: 1300

## 2020-06-04 ENCOUNTER — Inpatient Hospital Stay: Payer: Medicare HMO

## 2020-06-04 VITALS — BP 162/86 | HR 88

## 2020-06-04 DIAGNOSIS — D631 Anemia in chronic kidney disease: Secondary | ICD-10-CM | POA: Diagnosis not present

## 2020-06-04 DIAGNOSIS — D472 Monoclonal gammopathy: Secondary | ICD-10-CM

## 2020-06-04 DIAGNOSIS — N183 Chronic kidney disease, stage 3 unspecified: Secondary | ICD-10-CM

## 2020-06-04 DIAGNOSIS — C9000 Multiple myeloma not having achieved remission: Secondary | ICD-10-CM | POA: Diagnosis not present

## 2020-06-04 LAB — HEMOGLOBIN AND HEMATOCRIT, BLOOD
HCT: 27.9 % — ABNORMAL LOW (ref 39.0–52.0)
Hemoglobin: 9.2 g/dL — ABNORMAL LOW (ref 13.0–17.0)

## 2020-06-04 MED ORDER — EPOETIN ALFA-EPBX 40000 UNIT/ML IJ SOLN
40000.0000 [IU] | Freq: Once | INTRAMUSCULAR | Status: AC
  Administered 2020-06-04: 40000 [IU] via SUBCUTANEOUS
  Filled 2020-06-04: qty 1

## 2020-06-09 NOTE — Progress Notes (Signed)
Hematology/Oncology Consult note [facility]  [phone] [phone]  Patient Care Team: Birdie Sons, MD as PCP - General (Family Medicine) [facility], [facility])   Name of the patient: Nicholas Weiss  [id number]  1950-07-01   Date of visit: 06/09/20  Diagnosis-smoldering multiple myeloma versus overt myeloma not currently on myeloma treatment  Anemia of chronic kidney disease  Chief complaint/ Reason for visit-discuss PET CT scan and bone marrow biopsy results and further management  Heme/Onc history:  Patient is a 70 year old African-American male with a past medical history significant for uncontrolled type 1 diabetes, chronic kidney disease stage III chronic normocytic anemia referred for abnormal SPEP. Patient's creatinine has been fluctuating between 1.5-2 but about 5 months ago it went up all the way to 5 and his blood sugars were in the 1000 range. More recently his kidney numbers have been drifting back to normal values. As a part of the work-up he had serum protein electrophoresis done which showed an elevated gammaglobulin fraction with an M spike of 3%. The amount of M spike was not quantified in the specimen. He has therefore been referred to [place] for further management.Patient endorses chronic fatigue reports that his appetite and weight have remained stable  Results of blood work from 04/08/2020 were as follows: CBC showed white count of 2.9, H&H of 10.5/31.2 with an MCV of 91 and a platelet count of 191.  CMP showed a mildly elevated creatinine of 1.2 which was better as compared to 5 months ago when it was 3.9.  Total protein was mildly elevated at 9.1 calcium normal at 8.4 ferritin and iron studies B12 folate TSH and haptoglobin were normal.  Myeloma panel revealed an elevated IgG level of 06/04/2004 with an M protein of 3.1 g.  Immunofixation showed IgG lambda specificity.  Serum free light chain ratio was elevated  at 25 and free light chain lambda elevated at 370  Further myeloma work-up including a PET CT scan did not reveal any evidence of edematous lesions.  Bone marrow biopsy showed 23% plasma cells by manual count and 30% by CD138 IHC.  Normal cytogenetics.  FISH studies for myeloma showed gain of 1 q.  13 q-. detected.  P53 not detected.   Interval history-patient is here with his wife today.  He reports baseline fatigue but denies other complaints.  Denies any new aches and pains anywhere  ECOG PS- 1 Pain scale- 0   Review of systems- Review of Systems  Constitutional: Positive for malaise/fatigue. Negative for chills, fever and weight loss.  HENT: Negative for congestion, ear discharge and nosebleeds.   Eyes: Negative for blurred vision.  Respiratory: Negative for cough, hemoptysis, sputum production, shortness of breath and wheezing.   Cardiovascular: Negative for chest pain, palpitations, orthopnea and claudication.  Gastrointestinal: Negative for abdominal pain, blood in stool, constipation, diarrhea, heartburn, melena, nausea and vomiting.  Genitourinary: Negative for dysuria, flank pain, frequency, hematuria and urgency.  Musculoskeletal: Negative for back pain, joint pain and myalgias.  Skin: Negative for rash.  Neurological: Negative for dizziness, tingling, focal weakness, seizures, weakness and headaches.  Endo/Heme/Allergies: Does not bruise/bleed easily.  Psychiatric/Behavioral: Negative for depression and suicidal ideas. The patient does not have insomnia.       Allergies  Allergen Reactions  . Penicillins Other (See Comments)    Has patient had a PCN reaction causing immediate rash, facial/tongue/throat swelling, SOB or lightheadedness with hypotension: No Has patient had a PCN reaction causing  severe rash involving mucus membranes or skin necrosis: No Has patient had a PCN reaction that required hospitalization No Has patient had a PCN reaction occurring within the last  10 years: No If all of the above answers are "NO", then may proceed with Cephalosporin use.      Past Medical History:  Diagnosis Date  . DKA (diabetic ketoacidoses) 04/06/2016  . Hypercholesteremia   . Hypertension      Past Surgical History:  Procedure Laterality Date  . COLONOSCOPY WITH PROPOFOL N/A 02/01/2020   Procedure: COLONOSCOPY WITH PROPOFOL;  Surgeon: Lin Landsman, MD;  Location: [facility] ENDOSCOPY;  Service: Gastroenterology;  Laterality: N/A;  . ESOPHAGOGASTRODUODENOSCOPY  02/01/2020   Procedure: ESOPHAGOGASTRODUODENOSCOPY (EGD);  Surgeon: Lin Landsman, MD;  Location: [facility] ENDOSCOPY;  Service: Gastroenterology;;  . KNEE SURGERY Right    Torn meniscus  . KNEE SURGERY Left     Social History   Socioeconomic History  . Marital status: Single    Spouse name: Not on file  . Number of children: 3  . Years of education: Not on file  . 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 Smoking status: Never Smoker  . Smokeless tobacco: Never Used  Vaping Use  . Vaping Use: Never used  Substance and Sexual Activity  . Alcohol use: Yes    Alcohol/week: 0.0 - 1.0 standard drinks    Comment: "once every 2 months"  . Drug use: Not Currently    Types: Marijuana    Comment: "none in a long time'  . Sexual activity: Yes    Birth control/protection: None  Other Topics Concern  . Not on file  Social History Narrative  . Not on file   Social Determinants of Health   Financial Resource Strain: Low Risk   . Difficulty of Paying Living Expenses: Not hard at all  Food Insecurity: No Food Insecurity  . Worried About Charity fundraiser in the Last Year: Never true  . Ran Out of Food in the Last Year: Never true  Transportation Needs: No Transportation Needs  . Lack of Transportation (Medical): No  . Lack of Transportation (Non-Medical): No  Physical Activity: Insufficiently Active  . Days of  Exercise per Week: 7 days  . Minutes of Exercise per Session: 20 min  Stress: No Stress Concern Present  . Feeling of Stress : Not at all  Social Connections: Moderately Isolated  . Frequency of Communication with Friends and Family: More than three times a week  . Frequency of Social Gatherings with Friends and Family: More than three times a week  . Attends Religious Services: Never  . Active Member of Clubs or Organizations: No  . Attends Archivist Meetings: Never  . Marital Status: Living with partner  Intimate Partner Violence: Not At Risk  . Fear of Current or Ex-Partner: No  . Emotionally Abused: No  . Physically Abused: No  . Sexually Abused: No    Family History  Problem Relation Age of Onset  . Heart attack Father   . Hypertension Sister   . Cancer Sister      Current Outpatient Medications:  .  amLODipine (NORVASC) 10 MG tablet, Take 1 tablet (10 mg total) by mouth daily., Disp: 90 tablet, Rfl: 1 .  aspirin 81 MG chewable tablet, Chew 1 tablet by mouth daily., Disp: , Rfl:  .  insulin aspart (NOVOLOG) 100 UNIT/ML injection, Inject  up to 14 units three times daily before meals according to sliding scale, Disp: 10 mL, Rfl: 5 .  insulin degludec (TRESIBA FLEXTOUCH) 100 UNIT/ML FlexTouch Pen, INJECT 30 UNITS SUBCUTANEOUSLY ONCE DAILY. INCREASE AS DIRECTED TO KEEP FASTING GLUCOSE LESS THAN 120., Disp: 3 mL, Rfl: 0 .  lovastatin (MEVACOR) 40 MG tablet, Take 1 tablet (40 mg total) by mouth at bedtime., Disp: 90 tablet, Rfl: 4 .  methocarbamol (ROBAXIN) 500 MG tablet, Take 1 tablet (500 mg total) by mouth 3 (three) times daily., Disp: 15 tablet, Rfl: 0 .  pregabalin (LYRICA) 75 MG capsule, Take 1 capsule (75 mg total) by mouth 2 (two) times daily., Disp: 60 capsule, Rfl: 3 .  omeprazole (PRILOSEC) 40 MG capsule, Take 1 capsule (40 mg total) by mouth 2 (two) times daily before a meal., Disp: 60 capsule, Rfl: 2  Physical exam:  Vitals:   05/28/20 1454  BP: (!)  156/86  Pulse: 88  SpO2: 100%  Weight: 177 lb 1.6 oz (80.3 kg)   Physical Exam Constitutional:      General: He is not in acute distress. Eyes:     Pupils: Pupils are equal, round, and reactive to light.  Cardiovascular:     Rate and Rhythm: Normal rate and regular rhythm.     Heart sounds: Normal heart sounds.  Pulmonary:     Effort: Pulmonary effort is normal.     Breath sounds: Normal breath sounds.  Skin:    General: Skin is warm and dry.  Neurological:     Mental Status: He is alert and oriented to person, place, and time.      CMP Latest Ref Rng & Units 05/28/2020  Glucose 70 - 99 mg/dL 171(H)  BUN 8 - 23 mg/dL 33(H)  Creatinine 0.61 - 1.24 mg/dL 1.98(H)  Sodium 135 - 145 mmol/L 136  Potassium 3.5 - 5.1 mmol/L 4.2  Chloride 98 - 111 mmol/L 102  CO2 22 - 32 mmol/L 24  Calcium 8.9 - 10.3 mg/dL 8.5(L)  Total Protein 6.5 - 8.1 g/dL 8.7(H)  Total Bilirubin 0.3 - 1.2 mg/dL 0.4  Alkaline Phos 38 - 126 U/L 51  AST 15 - 41 U/L 20  ALT 0 - 44 U/L 20   CBC Latest Ref Rng & Units 06/04/2020  WBC 4.0 - 10.5 K/uL -  Hemoglobin 13.0 - 17.0 g/dL 9.2(L)  Hematocrit 39.0 - 52.0 % 27.9(L)  Platelets 150 - 400 K/uL -    No images are attached to the encounter.  NM PET Image Initial (PI) Skull Base To Thigh  Result Date: 05/14/2020 CLINICAL DATA:  Initial treatment strategy for multiple myeloma. EXAM: NUCLEAR MEDICINE PET SKULL BASE TO THIGH TECHNIQUE: 9.4 mCi F-18 FDG was injected intravenously. Full-ring PET imaging was performed from the skull base to thigh after the radiotracer. CT data was obtained and used for attenuation correction and anatomic localization. Fasting blood glucose: 93 mg/dl COMPARISON:  CT abdomen 04/02/2018 FINDINGS: Mediastinal blood pool activity: SUV max 1.86 Liver activity: SUV max NA NECK: No hypermetabolic lymph nodes in the neck. Incidental CT findings: none CHEST: No hypermetabolic mediastinal or hilar nodes. No suspicious pulmonary nodules on the CT  scan. Incidental CT findings: Scattered aortic calcifications and three-vessel coronary artery calcifications. ABDOMEN/PELVIS: No abnormal hypermetabolic activity within the liver, pancreas, adrenal glands, or spleen. No hypermetabolic lymph nodes in the abdomen or pelvis. Incidental CT findings: Age advanced atherosclerotic calcifications involving the aorta and branch vessel ostia and iliac arteries. No aneurysm. SKELETON: No  hypermetabolic bone lesions are identified. No definite lytic myelomatous lesions are identified on the CT scan. Incidental CT findings: none IMPRESSION: No hypermetabolic lytic myelomatous lesions are identified. Electronically Signed   By: Marijo Sanes M.D.   On: 05/14/2020 16:57     Assessment and plan- Patient is a 70 y.o. male here to discuss bone marrow and ept scan results and further management  Patient noted to have an anemia with a hemoglobin that has remained between 9.5-10.5 over the last 1 year.  Although his renal functions were abnormal about 3 months ago presently they are back to baseline with a creatinine of 1.2.  Labs from today were pending at the time of my visit.  Myeloma panel showed M protein of 3 g with immunofixation showing IgG lambda monoclonal protein.  IgG levels elevated at 4230.  Kappa lambda light chain ratio elevated at 25.  Bone marrow biopsy shows 30% plasma cells by CD 138 IHC.  PET scan showed no evidence of myeloma.  The only "CRAB criteria" that he has at present is anemia. He does have baseline CKD possibly due to HTN/ DM. His anemia can be due to CKD as well. At this time I would like to label his condition as smoldering multiple myeloma instead of overt myeloma and give him a trial of EPO over the next 2-3 months.  If his Hb remains stable between 10-11 with EPO, I will watch him without startign active myeloma treatment. However, if his CKD and /or anemia worsens along with other myeloma labs, I will have a low threshold to initiate  myeloma treatment.  Discussed natural history of multiple myeloma and treatments involved. Patient and his wife understand my plan well.   I will see him in 9 weeks    Visit Diagnosis 1. Smoldering multiple myeloma ([place])   2. Anemia of chronic kidney failure, stage 3 (moderate) ([facility])      Dr. Randa Evens, MD, MPH [facility] [phone] 06/09/2020 4:06 PM

## 2020-06-12 ENCOUNTER — Other Ambulatory Visit: Payer: Self-pay | Admitting: Family Medicine

## 2020-06-12 ENCOUNTER — Telehealth: Payer: Self-pay

## 2020-06-12 MED ORDER — TRESIBA FLEXTOUCH 100 UNIT/ML ~~LOC~~ SOPN: PEN_INJECTOR | SUBCUTANEOUS | 0 refills | Status: DC

## 2020-06-12 NOTE — Telephone Encounter (Signed)
Copied from [place] [id number]. Topic: Quick Communication - Rx Refill/Question >> Jun 12, 2020  1:26 PM Lenon Curt, [place] A wrote: Medication: insulin degludec (TRESIBA FLEXTOUCH) 100 UNIT/ML FlexTouch Pen   Has the patient contacted their pharmacy? Yes. Pharmacy has contacted patient and directed them to contact PCP  Preferred Pharmacy (with phone number or street name): [facility] ([facility] [place] - [address]  Phone:  [phone]  Agent: Please be advised that RX refills may take up to 3 business days. We ask that you follow-up with your pharmacy.

## 2020-06-12 NOTE — Telephone Encounter (Signed)
Copied from [place] [id number]. Topic: General - Other >> Jun 12, 2020  1:28 PM Tessa Lerner A wrote: Reason for CRM: Patient would like to be contact regarding insulin samples Patient is uncertain of their financial ability to receive prescriptions this month Please contact to advise further if possible

## 2020-06-12 NOTE — Telephone Encounter (Signed)
I called and spoke with patient. He was requesting a refill on Tresiba. I advised patient that a refill was sent in earlier today by Dr. Caryn Section. Patient plans to check with pharmacy to see if medication is ready for pick up.

## 2020-06-14 ENCOUNTER — Ambulatory Visit (INDEPENDENT_AMBULATORY_CARE_PROVIDER_SITE_OTHER): Payer: Medicare HMO | Admitting: Family Medicine

## 2020-06-14 ENCOUNTER — Other Ambulatory Visit: Payer: Self-pay

## 2020-06-14 VITALS — BP 142/85 | HR 81 | Temp 97.9°F | Ht 71.0 in | Wt 182.8 lb

## 2020-06-14 DIAGNOSIS — E1165 Type 2 diabetes mellitus with hyperglycemia: Secondary | ICD-10-CM | POA: Diagnosis not present

## 2020-06-14 DIAGNOSIS — E1122 Type 2 diabetes mellitus with diabetic chronic kidney disease: Secondary | ICD-10-CM | POA: Diagnosis not present

## 2020-06-14 LAB — POCT GLYCOSYLATED HEMOGLOBIN (HGB A1C)
Estimated Average Glucose: 206
Hemoglobin A1C: 8.8 % — AB (ref 4.0–5.6)

## 2020-06-14 NOTE — Patient Instructions (Signed)
.   Please review the attached list of medications and notify my office if there are any errors.   . Please bring all of your medications to every appointment so we can make sure that our medication list is the same as yours.   

## 2020-06-14 NOTE — Progress Notes (Signed)
Established patient visit   Patient: Nicholas Weiss   DOB: 06/22/1950   70 y.o. Male  MRN: [id number] Visit Date: 06/14/2020  Today's healthcare provider: Lelon Huh, MD   No chief complaint on file.  Subjective    HPI  Diabetes Mellitus Type II, follow-up  Lab Results  Component Value Date   HGBA1C 10.1 (A) 03/11/2020   HGBA1C 9.5 (A) 12/25/2019   HGBA1C 9.7 (A) 09/18/2019   Last seen for diabetes 3 months ago.  Management since then includes; Fastings are well controlled. Continue same dose of Tresiba. However a1c is up and sugars in the 200s and 300s through the day. Will increase mealtime SS Humalog as per Patient Instructions.  He reports excellent compliance with treatment. He is not having side effects.   Home blood sugar records: fasting range: 80-120 avg in morning  Episodes of hypoglycemia? No    Current insulin regiment: tresiba injection once daily (he takes in morning) Most Recent Eye Exam: 05/31/2020  --------------------------------------------------------------------------------------------------- Hypertension, follow-up  BP Readings from Last 3 Encounters:  06/04/20 (!) 162/86  05/28/20 (!) 156/86  04/29/20 (!) 143/92   Wt Readings from Last 3 Encounters:  05/28/20 177 lb 1.6 oz (80.3 kg)  04/29/20 166 lb (75.3 kg)  04/23/20 165 lb 4.8 oz (75 kg)     He was last seen for hypertension 6 months ago.  BP at that visit was 162/92. Management since that visit includes; Well controlled.  Continue current medications.  He reports excellent compliance with treatment. He is not having side effects.  He is exercising. - walking He is adherent to low salt diet.   Outside blood pressures are 140s/80s.  He does not smoke.  Use of agents associated with hypertension: NSAIDS. - 2 every morning  ---------------------------------------------------------------------------------------------------  Stage 3b chronic kidney disease ([place]) From  03/11/2020-Has markedly elevated creating with last hospitalization.     Medications: Outpatient Medications Prior to Visit  Medication Sig  . amLODipine (NORVASC) 10 MG tablet Take 1 tablet (10 mg total) by mouth daily.  Marland Kitchen aspirin 81 MG chewable tablet Chew 1 tablet by mouth daily.  . insulin aspart (NOVOLOG) 100 UNIT/ML injection Inject up to 14 units three times daily before meals according to sliding scale  . insulin degludec (TRESIBA FLEXTOUCH) 100 UNIT/ML FlexTouch Pen INJECT 30 UNITS SUBCUTANEOUSLY ONCE DAILY. INCREASE AS DIRECTED TO KEEP FASTING BLOOD SUGAR LESS THAN 120.  . lovastatin (MEVACOR) 40 MG tablet Take 1 tablet (40 mg total) by mouth at bedtime.  . methocarbamol (ROBAXIN) 500 MG tablet Take 1 tablet (500 mg total) by mouth 3 (three) times daily.  Marland Kitchen omeprazole (PRILOSEC) 40 MG capsule Take 1 capsule (40 mg total) by mouth 2 (two) times daily before a meal.  . pregabalin (LYRICA) 75 MG capsule Take 1 capsule (75 mg total) by mouth 2 (two) times daily.   No facility-administered medications prior to visit.    Review of Systems  Constitutional: Negative for appetite change, chills and fever.  Respiratory: Negative for chest tightness, shortness of breath and wheezing.   Cardiovascular: Negative for chest pain and palpitations.  Gastrointestinal: Negative for abdominal pain, nausea and vomiting.       Objective    BP (!) 142/85 (BP Location: Right Arm, Patient Position: Sitting, Cuff Size: Normal)   Pulse 81   Temp 97.9 F (36.6 C) (Temporal)   Ht 5' 11"  (1.803 m)   Wt 182 lb 12.8 oz (82.9 kg)   SpO2  100%   BMI 25.50 kg/m     Physical Exam    General: Appearance:     Well developed, well nourished male in no acute distress  Eyes:    PERRL, conjunctiva/corneas clear, EOM's intact       Lungs:     Clear to auscultation bilaterally, respirations unlabored  Heart:    Normal heart rate. Normal rhythm. No murmurs, rubs, or gallops.   MS:   All extremities are  intact.   Neurologic:   Awake, alert, oriented x 3. No apparent focal neurological           defect.         Results for orders placed or performed in visit on 06/14/20  POCT HgB A1C  Result Value Ref Range   Hemoglobin A1C 8.8 (A) 4.0 - 5.6 %   Estimated Average Glucose 206     Assessment & Plan     1. Uncontrolled type 2 diabetes mellitus with hyperglycemia ([place]) Improved now that he is taking [name] every day consistently along with SSI Novolog. Continue current medications.  Follow up 3 months.    Has follow up with Dr. Janese Banks in May. Will review her labs. Consider nephrology referral if eGFR is not improving .      The entirety of the information documented in the History of Present Illness, Review of Systems and Physical Exam were personally obtained by me. Portions of this information were initially documented by the CMA and reviewed by me for thoroughness and accuracy.      Lelon Huh, MD  [facility] [phone] (phone) [phone] (fax)  [place]

## 2020-06-18 ENCOUNTER — Ambulatory Visit: Payer: Medicare HMO

## 2020-06-18 ENCOUNTER — Other Ambulatory Visit: Payer: Medicare HMO

## 2020-06-20 ENCOUNTER — Other Ambulatory Visit: Payer: Self-pay | Admitting: Family Medicine

## 2020-06-20 NOTE — Telephone Encounter (Signed)
pregabalin (LYRICA) 75 MG capsule Pharmacy faxed refill request for the following medications:  pregabalin (LYRICA) 75 MG capsule  Last Rx: 12/25/19 LOV: 06/14/2020 Please advise. Thanks TNP

## 2020-06-21 MED ORDER — PREGABALIN 75 MG PO CAPS: 75.0000 mg | ORAL_CAPSULE | Freq: Two times a day (BID) | ORAL | 4 refills | Status: DC

## 2020-06-25 ENCOUNTER — Other Ambulatory Visit: Payer: Self-pay | Admitting: Family Medicine

## 2020-06-25 ENCOUNTER — Inpatient Hospital Stay: Payer: Medicare HMO

## 2020-06-25 DIAGNOSIS — E1040 Type 1 diabetes mellitus with diabetic neuropathy, unspecified: Secondary | ICD-10-CM

## 2020-06-25 DIAGNOSIS — I10 Essential (primary) hypertension: Secondary | ICD-10-CM

## 2020-06-25 MED ORDER — TRESIBA FLEXTOUCH 100 UNIT/ML ~~LOC~~ SOPN
PEN_INJECTOR | SUBCUTANEOUS | 3 refills | Status: DC
Start: 2020-06-25 — End: 2020-08-02

## 2020-06-25 MED ORDER — INSULIN ASPART 100 UNIT/ML ~~LOC~~ SOLN: SUBCUTANEOUS | 4 refills | Status: DC

## 2020-06-25 MED ORDER — AMLODIPINE BESYLATE 10 MG PO TABS: 10.0000 mg | ORAL_TABLET | Freq: Every day | ORAL | 4 refills | Status: DC

## 2020-06-26 ENCOUNTER — Inpatient Hospital Stay: Payer: Medicare HMO

## 2020-06-26 ENCOUNTER — Other Ambulatory Visit: Payer: Self-pay

## 2020-06-26 VITALS — BP 169/97 | HR 84

## 2020-06-26 DIAGNOSIS — C9000 Multiple myeloma not having achieved remission: Secondary | ICD-10-CM

## 2020-06-26 DIAGNOSIS — D631 Anemia in chronic kidney disease: Secondary | ICD-10-CM | POA: Diagnosis not present

## 2020-06-26 DIAGNOSIS — N183 Chronic kidney disease, stage 3 unspecified: Secondary | ICD-10-CM

## 2020-06-26 DIAGNOSIS — D472 Monoclonal gammopathy: Secondary | ICD-10-CM

## 2020-06-26 LAB — HEMOGLOBIN AND HEMATOCRIT, BLOOD
HCT: 31.9 % — ABNORMAL LOW (ref 39.0–52.0)
Hemoglobin: 10.4 g/dL — ABNORMAL LOW (ref 13.0–17.0)

## 2020-06-26 MED ORDER — EPOETIN ALFA-EPBX 40000 UNIT/ML IJ SOLN
40000.0000 [IU] | Freq: Once | INTRAMUSCULAR | Status: AC
  Administered 2020-06-26: 40000 [IU] via SUBCUTANEOUS
  Filled 2020-06-26: qty 1

## 2020-06-27 ENCOUNTER — Telehealth: Payer: Self-pay | Admitting: Family Medicine

## 2020-06-27 DIAGNOSIS — E1165 Type 2 diabetes mellitus with hyperglycemia: Secondary | ICD-10-CM

## 2020-06-27 NOTE — Telephone Encounter (Signed)
Order for requested Diabetic supplies pulled and pending in this encounter. Please advise on qty and directions for supplies.

## 2020-06-27 NOTE — Telephone Encounter (Addendum)
[facility] mail order pharm is calling and pt needs new rx for BD pen needles 1 ml 31gx5/16 also needs true metrix solution and bd single use alcohol swabs  enough for 90 day supply. Pt has new Photographer mail order

## 2020-07-04 DIAGNOSIS — S52532A Colles' fracture of left radius, initial encounter for closed fracture: Secondary | ICD-10-CM | POA: Diagnosis not present

## 2020-07-09 ENCOUNTER — Ambulatory Visit: Payer: Medicare HMO

## 2020-07-09 ENCOUNTER — Other Ambulatory Visit: Payer: Medicare HMO

## 2020-07-11 MED ORDER — TRUE METRIX LEVEL 1 LOW VI SOLN: 2 refills | Status: DC

## 2020-07-11 MED ORDER — BD SWAB SINGLE USE REGULAR PADS
MEDICATED_PAD | 4 refills | Status: DC
Start: 2020-07-11 — End: 2022-02-18

## 2020-07-11 MED ORDER — INSULIN SYRINGE 31G X 5/16" 1 ML MISC
4 refills | Status: DC
Start: 2020-07-11 — End: 2022-02-18

## 2020-07-16 ENCOUNTER — Inpatient Hospital Stay: Payer: Medicare HMO

## 2020-07-16 ENCOUNTER — Inpatient Hospital Stay: Payer: Medicare HMO | Attending: Oncology

## 2020-07-16 ENCOUNTER — Other Ambulatory Visit: Payer: Self-pay

## 2020-07-16 VITALS — BP 119/75 | HR 73

## 2020-07-16 DIAGNOSIS — C9000 Multiple myeloma not having achieved remission: Secondary | ICD-10-CM

## 2020-07-16 DIAGNOSIS — N183 Chronic kidney disease, stage 3 unspecified: Secondary | ICD-10-CM

## 2020-07-16 DIAGNOSIS — D631 Anemia in chronic kidney disease: Secondary | ICD-10-CM | POA: Diagnosis not present

## 2020-07-16 DIAGNOSIS — D472 Monoclonal gammopathy: Secondary | ICD-10-CM

## 2020-07-16 LAB — HEMOGLOBIN AND HEMATOCRIT, BLOOD
HCT: 30 % — ABNORMAL LOW (ref 39.0–52.0)
Hemoglobin: 9.9 g/dL — ABNORMAL LOW (ref 13.0–17.0)

## 2020-07-16 MED ORDER — EPOETIN ALFA-EPBX 40000 UNIT/ML IJ SOLN
40000.0000 [IU] | Freq: Once | INTRAMUSCULAR | Status: AC
  Administered 2020-07-16: 40000 [IU] via SUBCUTANEOUS
  Filled 2020-07-16: qty 1

## 2020-07-20 ENCOUNTER — Emergency Department
Admission: EM | Admit: 2020-07-20 | Discharge: 2020-07-21 | Disposition: A | Discharge status: Home / Self Care | Payer: Medicare HMO | Attending: Emergency Medicine | Admitting: Emergency Medicine

## 2020-07-20 ENCOUNTER — Other Ambulatory Visit: Payer: Self-pay

## 2020-07-20 DIAGNOSIS — Z79899 Other long term (current) drug therapy: Secondary | ICD-10-CM | POA: Insufficient documentation

## 2020-07-20 DIAGNOSIS — R55 Syncope and collapse: Secondary | ICD-10-CM | POA: Diagnosis not present

## 2020-07-20 DIAGNOSIS — I129 Hypertensive chronic kidney disease with stage 1 through stage 4 chronic kidney disease, or unspecified chronic kidney disease: Secondary | ICD-10-CM | POA: Insufficient documentation

## 2020-07-20 DIAGNOSIS — Z7982 Long term (current) use of aspirin: Secondary | ICD-10-CM | POA: Diagnosis not present

## 2020-07-20 DIAGNOSIS — Z794 Long term (current) use of insulin: Secondary | ICD-10-CM | POA: Insufficient documentation

## 2020-07-20 DIAGNOSIS — R531 Weakness: Secondary | ICD-10-CM | POA: Diagnosis not present

## 2020-07-20 DIAGNOSIS — E1122 Type 2 diabetes mellitus with diabetic chronic kidney disease: Secondary | ICD-10-CM | POA: Insufficient documentation

## 2020-07-20 DIAGNOSIS — R61 Generalized hyperhidrosis: Secondary | ICD-10-CM | POA: Diagnosis not present

## 2020-07-20 DIAGNOSIS — N1831 Chronic kidney disease, stage 3a: Secondary | ICD-10-CM | POA: Insufficient documentation

## 2020-07-20 LAB — CBC WITH DIFFERENTIAL/PLATELET
Abs Immature Granulocytes: 0.01 10*3/uL (ref 0.00–0.07)
Basophils Absolute: 0 10*3/uL (ref 0.0–0.1)
Basophils Relative: 1 %
Eosinophils Absolute: 0 10*3/uL (ref 0.0–0.5)
Eosinophils Relative: 1 %
HCT: 30.5 % — ABNORMAL LOW (ref 39.0–52.0)
Hemoglobin: 10.6 g/dL — ABNORMAL LOW (ref 13.0–17.0)
Immature Granulocytes: 0 %
Lymphocytes Relative: 40 %
Lymphs Abs: 1 10*3/uL (ref 0.7–4.0)
MCH: 31.9 pg (ref 26.0–34.0)
MCHC: 34.8 g/dL (ref 30.0–36.0)
MCV: 91.9 fL (ref 80.0–100.0)
Monocytes Absolute: 0.4 10*3/uL (ref 0.1–1.0)
Monocytes Relative: 14 %
Neutro Abs: 1.1 10*3/uL — ABNORMAL LOW (ref 1.7–7.7)
Neutrophils Relative %: 44 %
Platelets: 200 10*3/uL (ref 150–400)
RBC: 3.32 MIL/uL — ABNORMAL LOW (ref 4.22–5.81)
RDW: 13.3 % (ref 11.5–15.5)
WBC: 2.5 10*3/uL — ABNORMAL LOW (ref 4.0–10.5)
nRBC: 0 % (ref 0.0–0.2)

## 2020-07-20 LAB — BASIC METABOLIC PANEL
Anion gap: 6 (ref 5–15)
BUN: 29 mg/dL — ABNORMAL HIGH (ref 8–23)
CO2: 24 mmol/L (ref 22–32)
Calcium: 8.2 mg/dL — ABNORMAL LOW (ref 8.9–10.3)
Chloride: 106 mmol/L (ref 98–111)
Creatinine, Ser: 1.93 mg/dL — ABNORMAL HIGH (ref 0.61–1.24)
GFR, Estimated: 37 mL/min — ABNORMAL LOW (ref >60)
Glucose, Bld: 130 mg/dL — ABNORMAL HIGH (ref 70–99)
Potassium: 4.4 mmol/L (ref 3.5–5.1)
Sodium: 136 mmol/L (ref 135–145)

## 2020-07-20 LAB — URINALYSIS, COMPLETE (UACMP) WITH MICROSCOPIC
Bacteria, UA: NONE SEEN
Bilirubin Urine: NEGATIVE
Glucose, UA: >=500 mg/dL — AB
Hgb urine dipstick: NEGATIVE
Ketones, ur: NEGATIVE mg/dL
Leukocytes,Ua: NEGATIVE
Nitrite: NEGATIVE
Protein, ur: 30 mg/dL — AB
Specific Gravity, Urine: 1.016 (ref 1.005–1.030)
pH: 5 (ref 5.0–8.0)

## 2020-07-20 LAB — TROPONIN I (HIGH SENSITIVITY)
Troponin I (High Sensitivity): 19 ng/L — ABNORMAL HIGH (ref <18)
Troponin I (High Sensitivity): 18 ng/L — ABNORMAL HIGH (ref <18)

## 2020-07-20 LAB — CBG MONITORING, ED: Glucose-Capillary: 137 mg/dL — ABNORMAL HIGH (ref 70–99)

## 2020-07-20 MED ORDER — SODIUM CHLORIDE 0.9 % IV BOLUS
500.0000 mL | Freq: Once | INTRAVENOUS | Status: AC
  Administered 2020-07-20: 500 mL via INTRAVENOUS

## 2020-07-20 NOTE — ED Provider Notes (Signed)
[facility] Emergency Department Provider Note ____________________________________________   Event Date/Time   First MD Initiated Contact with Patient 07/20/20 1942     (approximate)  I have reviewed the triage vital signs and the nursing notes.   HISTORY  Chief Complaint Altered Mental Status (Pt arrives from home via [facility] - initial callout for diabetic problem - upon arrival pt hypotensive 80/66 -- 500cc NS bolus subsequently administered to 18G L AC placed by ems pta with improvement to 130/80- all other vitals stable. Per ems pt al noted to be diaphoretic and altered - GCS 13..  Pt now back to baseline. Per spouse at baseline pt GCS 15 and ambulates independently)    HPI AHMEER TUMAN is a 70 y.o. male with PMH as noted below including diabetes, hypertension, and chronic kidney disease who presents with an episode of near syncope or altered mental status.  The patient states that he was at a funeral this afternoon.  When he returned home he suddenly became lightheaded and passed out.  EMS reported that the patient's family noted that he was lethargic and altered for approximately 10 minutes.  He has now returned to his baseline, the patient states that he feels fine.  He states he checked his sugar earlier today and it was high although it was normal with EMS.  He states he has had some fatigue and decreased appetite recently.  He denies any associated chest pain, shortness of breath, vomiting, diarrhea, headache, weakness, numbness, or other acute symptoms.   Past Medical History:  Diagnosis Date  . DKA (diabetic ketoacidoses) 04/06/2016  . Hypercholesteremia   . Hypertension     Patient Active Problem List   Diagnosis Date Noted  . Anemia of chronic kidney failure, stage 3 (moderate) ([place]) 05/28/2020  . PUD (peptic ulcer disease)   . Esophageal dysphagia   . Encounter for screening colonoscopy   . Hyperglycemia due to type 2 diabetes mellitus  ([place]) 07/28/2019  . Generalized weakness 07/28/2019  . Type 2 diabetes mellitus with hyperglycemia ([place]) 07/28/2019  . Hypoglycemia 04/27/2019  . Unresponsiveness 04/27/2019  . Lung nodule 04/07/2019  . Uncontrolled type 1 diabetes mellitus ([place])   . Acute renal failure with acute tubular necrosis superimposed on stage 3a chronic kidney disease ([place])   . Hypertensive urgency 04/05/2019  . AKI (acute kidney injury) ([place]) 04/05/2019  . CKD (chronic kidney disease) stage 3, GFR 30-59 ml/min ([facility]) 04/05/2019  . DKA (diabetic ketoacidoses) 04/06/2016  . Hypercholesterolemia 01/24/2016  . Essential (primary) hypertension 06/07/2006  . Type 1 diabetes mellitus with diabetic neuropathy, unspecified ([place]) 05/31/2006    Past Surgical History:  Procedure Laterality Date  . COLONOSCOPY WITH PROPOFOL N/A 02/01/2020   Procedure: COLONOSCOPY WITH PROPOFOL;  Surgeon: Lin Landsman, MD;  Location: [facility] ENDOSCOPY;  Service: Gastroenterology;  Laterality: N/A;  . ESOPHAGOGASTRODUODENOSCOPY  02/01/2020   Procedure: ESOPHAGOGASTRODUODENOSCOPY (EGD);  Surgeon: Lin Landsman, MD;  Location: [facility];  Service: Gastroenterology;;  . KNEE SURGERY Right    Torn meniscus  . KNEE SURGERY Left     Prior to Admission medications   Medication Sig Start Date End Date Taking? Authorizing Provider  Alcohol Swabs (B-D SINGLE USE SWABS REGULAR) PADS Use to check blood sugar four times daily for type 1 diabetes 07/11/20   Birdie Sons, MD  amLODipine (NORVASC) 10 MG tablet Take 1 tablet (10 mg total) by mouth daily. 06/25/20   Birdie Sons, MD  aspirin 81 MG chewable  tablet Chew 1 tablet by mouth daily.    [provider]  Blood Glucose Calibration (TRUE METRIX LEVEL 1) Low SOLN Use to check blood sugar four times daily for type 1 diabetes 07/11/20   Birdie Sons, MD  insulin aspart (NOVOLOG) 100 UNIT/ML injection Inject up to 14 units three times daily before meals according to  sliding scale 06/25/20   Birdie Sons, MD  insulin degludec (TRESIBA FLEXTOUCH) 100 UNIT/ML FlexTouch Pen INJECT 30 UNITS SUBCUTANEOUSLY ONCE DAILY. INCREASE AS DIRECTED TO KEEP FASTING BLOOD SUGAR LESS THAN 120. 06/25/20   Birdie Sons, MD  Insulin Syringe-Needle U-100 (INSULIN SYRINGE 1CC/31GX5/16") 31G X 5/16" 1 ML MISC For insulin injections up to 4 times daily 07/11/20   Birdie Sons, MD  lovastatin (MEVACOR) 40 MG tablet Take 1 tablet (40 mg total) by mouth at bedtime. 08/02/18   Birdie Sons, MD  omeprazole (PRILOSEC) 40 MG capsule Take 1 capsule (40 mg total) by mouth 2 (two) times daily before a meal. 02/01/20 03/02/20  Vanga, Tally Due, MD  pregabalin (LYRICA) 75 MG capsule Take 1 capsule (75 mg total) by mouth 2 (two) times daily. 06/21/20   Birdie Sons, MD    Allergies Penicillins  Family History  Problem Relation Age of Onset  . Heart attack Father   . Hypertension Sister   . Cancer Sister     Social History Social History   Tobacco Use  . Smoking status: Never Smoker  . Smokeless tobacco: Never Used  Vaping Use  . Vaping Use: Never used  Substance Use Topics  . Alcohol use: Yes    Alcohol/week: 0.0 - 1.0 standard drinks    Comment: "once every 2 months"  . Drug use: Not Currently    Types: Marijuana    Comment: "none in a long time'    Review of Systems  Constitutional: No fever/chills. Eyes: No visual changes. ENT: No sore throat. Cardiovascular: Denies chest pain. Respiratory: Denies shortness of breath. Gastrointestinal: No vomiting or diarrhea.  Genitourinary: Negative for dysuria.  Musculoskeletal: Negative for back pain. Skin: Negative for rash. Neurological: Negative for headaches, focal weakness or numbness.   ____________________________________________   PHYSICAL EXAM:  VITAL SIGNS: ED Triage Vitals  Enc Vitals Group     BP 07/20/20 1933 (!) 145/90     Pulse Rate 07/20/20 1933 75     Resp 07/20/20 1933 14     Temp  07/20/20 1933 97.8 F (36.6 C)     Temp Source 07/20/20 1933 Oral     SpO2 07/20/20 1933 100 %     Weight 07/20/20 1935 178 lb (80.7 kg)     Height 07/20/20 1935 5' 10"  (1.778 m)     Head Circumference --      Peak Flow --      Pain Score 07/20/20 1934 0     Pain Loc --      Pain Edu? --      Excl. in [place]? --     Constitutional: Alert and oriented. Well appearing and in no acute distress. Eyes: Conjunctivae are normal.  EOMI.  PERRLA. Head: Atraumatic. Nose: No congestion/rhinnorhea. Mouth/Throat: Mucous membranes are slightly dry. Neck: Normal range of motion.  Cardiovascular: Normal rate, regular rhythm. Grossly normal heart sounds.  Good peripheral circulation. Respiratory: Normal respiratory effort.  No retractions. Lungs CTAB. Gastrointestinal: Soft and nontender. No distention.  Genitourinary: No flank tenderness. Musculoskeletal: No lower extremity edema.  Extremities warm and well perfused.  Neurologic:  Normal speech and language.  Motor intact in all extremities.  Normal coordination.  No ataxia on finger-to-nose.  No pronator drift.  No facial droop. Skin:  Skin is warm and dry. No rash noted. Psychiatric: Mood and affect are normal. Speech and behavior are normal.  ____________________________________________   LABS (all labs ordered are listed, but only abnormal results are displayed)  Labs Reviewed  BASIC METABOLIC PANEL - Abnormal; Notable for the following components:      Result Value   Glucose, Bld 130 (*)    BUN 29 (*)    Creatinine, Ser 1.93 (*)    Calcium 8.2 (*)    GFR, Estimated 37 (*)    All other components within normal limits  CBC WITH DIFFERENTIAL/PLATELET - Abnormal; Notable for the following components:   WBC 2.5 (*)    RBC 3.32 (*)    Hemoglobin 10.6 (*)    HCT 30.5 (*)    Neutro Abs 1.1 (*)    All other components within normal limits  URINALYSIS, COMPLETE (UACMP) WITH MICROSCOPIC - Abnormal; Notable for the following components:    Color, Urine YELLOW (*)    APPearance CLEAR (*)    Glucose, UA >=500 (*)    Protein, ur 30 (*)    All other components within normal limits  CBG MONITORING, ED - Abnormal; Notable for the following components:   Glucose-Capillary 137 (*)    All other components within normal limits  TROPONIN I (HIGH SENSITIVITY) - Abnormal; Notable for the following components:   Troponin I (High Sensitivity) 19 (*)    All other components within normal limits  TROPONIN I (HIGH SENSITIVITY) - Abnormal; Notable for the following components:   Troponin I (High Sensitivity) 18 (*)    All other components within normal limits   ____________________________________________  EKG  ED ECG REPORT I, Arta Silence, the attending physician, personally viewed and interpreted this ECG.  Date: 07/21/2020 EKG Time: 1955 Rate: 74 Rhythm: normal sinus rhythm QRS Axis: normal Intervals: Prolonged PR interval ST/T Wave abnormalities: normal Narrative Interpretation: no evidence of acute ischemia; no significant change when compared to EKG of 03/20/2020  ____________________________________________  RADIOLOGY    ____________________________________________   PROCEDURES  Procedure(s) performed: No  Procedures  Critical Care performed: No ____________________________________________   INITIAL IMPRESSION / ASSESSMENT AND PLAN / ED COURSE  Pertinent labs & imaging results that were available during my care of the patient were reviewed by me and considered in my medical decision making (see chart for details).  70 year old male with PMH as noted above including diabetes, hypertension, chronic kidney disease presents after an episode of near syncope and/or altered mental status that lasted up to 10 minutes.  He has now returned to his baseline and is without any acute complaints.  I reviewed the past medical records in [place].  The patient currently follows with the cancer center for concern for  possible smoldering multiple myeloma and anemia although he has not formally been diagnosed with multiple myeloma.  He was last seen in the ED twice last December for elbow pain, and admitted for DKA last August.  On exam today the patient is alert and oriented.  His vital signs are normal except for mild hypertension.  Neurologic exam is nonfocal.  The physical exam is otherwise unremarkable.  Differential includes vasovagal near syncope, dehydration/hypovolemia, hypo- or hyperglycemia, other metabolic disturbance, or less likely infection or cardiac cause.  I do not suspect seizure, TIA, or other CNS cause.  Given the nonfocal neurologic exam there is no indication for brain imaging or other emergent neurological work-up.  We will obtain labs, give a fluid bolus, and reassess.  ----------------------------------------- 12:02 AM on 07/21/2020 -----------------------------------------  Lab work-up is unremarkable.  Troponins are negative x2 (slightly elevated likely due to to the patient's CKD but not increasing and lower than prior values), creatinine is at baseline, anemia is not worsened from baseline, and the urinalysis is negative.  The patient states he feels well.  His partner is now here who agrees that he is at his baseline.  The patient feels comfortable and wants to go home.  I counseled him on the results of the work-up.  At this time, I do not suspect arrhythmia or other etiology that would require further monitoring and I think it is reasonable to send him home.  Return precautions given, and he expresses understanding.   ____________________________________________   FINAL CLINICAL IMPRESSION(S) / ED DIAGNOSES  Final diagnoses:  Near syncope      NEW MEDICATIONS STARTED DURING THIS VISIT:  Discharge Medication List as of 07/21/2020 12:03 AM       Note:  This document was prepared using Dragon voice recognition software and may include unintentional dictation errors.    Arta Silence, MD 07/21/20 [id number]

## 2020-07-20 NOTE — ED Notes (Signed)
Urinal at bedside and pt instructed to call when specimen provided.

## 2020-07-20 NOTE — ED Notes (Signed)
Pt sts he has attempted to provide specimen and is unable. Pt provided beverage and snack and instructed to attempt again. Pt verbalized understanding.

## 2020-07-20 NOTE — ED Notes (Signed)
ED Provider at bedside. 

## 2020-07-20 NOTE — ED Notes (Signed)
Pt attempting UA collection at this time.  Visitor at bedside.

## 2020-07-21 NOTE — Discharge Instructions (Addendum)
Make sure to drink plenty of fluids, take your insulin as prescribed, and eat regularly throughout the day.  Follow-up with your regular doctor within the next week.  Return to the ER for new, worsening, or persistent weakness, dizziness, lightheadedness, recurrent episodes of confusion, feeling like you are going to pass out, or any other new or worsening symptoms that concern you.

## 2020-07-30 ENCOUNTER — Other Ambulatory Visit: Payer: Medicare HMO

## 2020-07-30 ENCOUNTER — Ambulatory Visit: Payer: Medicare HMO | Admitting: Oncology

## 2020-07-30 ENCOUNTER — Ambulatory Visit: Payer: Medicare HMO

## 2020-08-01 ENCOUNTER — Emergency Department: Payer: Medicare HMO

## 2020-08-01 ENCOUNTER — Observation Stay
Admission: EM | Admit: 2020-08-01 | Discharge: 2020-08-02 | Disposition: A | Discharge status: Home / Self Care | Payer: Medicare HMO | Attending: Hospitalist | Admitting: Hospitalist

## 2020-08-01 ENCOUNTER — Other Ambulatory Visit: Payer: Self-pay

## 2020-08-01 ENCOUNTER — Encounter: Payer: Self-pay | Admitting: Internal Medicine

## 2020-08-01 DIAGNOSIS — R42 Dizziness and giddiness: Secondary | ICD-10-CM | POA: Diagnosis not present

## 2020-08-01 DIAGNOSIS — Z7982 Long term (current) use of aspirin: Secondary | ICD-10-CM | POA: Diagnosis not present

## 2020-08-01 DIAGNOSIS — E1040 Type 1 diabetes mellitus with diabetic neuropathy, unspecified: Secondary | ICD-10-CM

## 2020-08-01 DIAGNOSIS — R0602 Shortness of breath: Secondary | ICD-10-CM | POA: Diagnosis not present

## 2020-08-01 DIAGNOSIS — Y900 Blood alcohol level of less than 20 mg/100 ml: Secondary | ICD-10-CM | POA: Diagnosis not present

## 2020-08-01 DIAGNOSIS — I441 Atrioventricular block, second degree: Secondary | ICD-10-CM

## 2020-08-01 DIAGNOSIS — Z79899 Other long term (current) drug therapy: Secondary | ICD-10-CM | POA: Insufficient documentation

## 2020-08-01 DIAGNOSIS — N1832 Chronic kidney disease, stage 3b: Secondary | ICD-10-CM | POA: Diagnosis not present

## 2020-08-01 DIAGNOSIS — Z794 Long term (current) use of insulin: Secondary | ICD-10-CM | POA: Diagnosis not present

## 2020-08-01 DIAGNOSIS — R55 Syncope and collapse: Secondary | ICD-10-CM | POA: Diagnosis not present

## 2020-08-01 DIAGNOSIS — N179 Acute kidney failure, unspecified: Secondary | ICD-10-CM | POA: Diagnosis present

## 2020-08-01 DIAGNOSIS — R231 Pallor: Secondary | ICD-10-CM | POA: Diagnosis not present

## 2020-08-01 DIAGNOSIS — Z20822 Contact with and (suspected) exposure to covid-19: Secondary | ICD-10-CM | POA: Insufficient documentation

## 2020-08-01 DIAGNOSIS — N183 Chronic kidney disease, stage 3 unspecified: Secondary | ICD-10-CM | POA: Diagnosis present

## 2020-08-01 DIAGNOSIS — E1122 Type 2 diabetes mellitus with diabetic chronic kidney disease: Secondary | ICD-10-CM | POA: Insufficient documentation

## 2020-08-01 DIAGNOSIS — I129 Hypertensive chronic kidney disease with stage 1 through stage 4 chronic kidney disease, or unspecified chronic kidney disease: Secondary | ICD-10-CM | POA: Insufficient documentation

## 2020-08-01 DIAGNOSIS — I10 Essential (primary) hypertension: Secondary | ICD-10-CM

## 2020-08-01 DIAGNOSIS — R531 Weakness: Secondary | ICD-10-CM

## 2020-08-01 DIAGNOSIS — D472 Monoclonal gammopathy: Secondary | ICD-10-CM

## 2020-08-01 DIAGNOSIS — R519 Headache, unspecified: Secondary | ICD-10-CM | POA: Diagnosis not present

## 2020-08-01 DIAGNOSIS — D631 Anemia in chronic kidney disease: Secondary | ICD-10-CM | POA: Diagnosis present

## 2020-08-01 DIAGNOSIS — I440 Atrioventricular block, first degree: Secondary | ICD-10-CM | POA: Diagnosis present

## 2020-08-01 DIAGNOSIS — D638 Anemia in other chronic diseases classified elsewhere: Secondary | ICD-10-CM | POA: Diagnosis present

## 2020-08-01 DIAGNOSIS — I959 Hypotension, unspecified: Secondary | ICD-10-CM | POA: Diagnosis not present

## 2020-08-01 DIAGNOSIS — R404 Transient alteration of awareness: Secondary | ICD-10-CM | POA: Diagnosis not present

## 2020-08-01 DIAGNOSIS — C9000 Multiple myeloma not having achieved remission: Secondary | ICD-10-CM

## 2020-08-01 LAB — URINE DRUG SCREEN, QUALITATIVE (ARMC ONLY)
Amphetamines, Ur Screen: NOT DETECTED
Barbiturates, Ur Screen: NOT DETECTED
Benzodiazepine, Ur Scrn: NOT DETECTED
Cannabinoid 50 Ng, Ur ~~LOC~~: POSITIVE — AB
Cocaine Metabolite,Ur ~~LOC~~: POSITIVE — AB
MDMA (Ecstasy)Ur Screen: NOT DETECTED
Methadone Scn, Ur: NOT DETECTED
Opiate, Ur Screen: NOT DETECTED
Phencyclidine (PCP) Ur S: NOT DETECTED
Tricyclic, Ur Screen: NOT DETECTED

## 2020-08-01 LAB — CBC WITH DIFFERENTIAL/PLATELET
Abs Immature Granulocytes: 0 10*3/uL (ref 0.00–0.07)
Basophils Absolute: 0 10*3/uL (ref 0.0–0.1)
Basophils Relative: 0 %
Eosinophils Absolute: 0 10*3/uL (ref 0.0–0.5)
Eosinophils Relative: 1 %
HCT: 29.7 % — ABNORMAL LOW (ref 39.0–52.0)
Hemoglobin: 9.7 g/dL — ABNORMAL LOW (ref 13.0–17.0)
Immature Granulocytes: 0 %
Lymphocytes Relative: 39 %
Lymphs Abs: 1 10*3/uL (ref 0.7–4.0)
MCH: 30.6 pg (ref 26.0–34.0)
MCHC: 32.7 g/dL (ref 30.0–36.0)
MCV: 93.7 fL (ref 80.0–100.0)
Monocytes Absolute: 0.4 10*3/uL (ref 0.1–1.0)
Monocytes Relative: 17 %
Neutro Abs: 1.1 10*3/uL — ABNORMAL LOW (ref 1.7–7.7)
Neutrophils Relative %: 43 %
Platelets: 203 10*3/uL (ref 150–400)
RBC: 3.17 MIL/uL — ABNORMAL LOW (ref 4.22–5.81)
RDW: 13.2 % (ref 11.5–15.5)
WBC: 2.5 10*3/uL — ABNORMAL LOW (ref 4.0–10.5)
nRBC: 0 % (ref 0.0–0.2)

## 2020-08-01 LAB — COMPREHENSIVE METABOLIC PANEL
ALT: 11 U/L (ref 0–44)
AST: 13 U/L — ABNORMAL LOW (ref 15–41)
Albumin: 3.1 g/dL — ABNORMAL LOW (ref 3.5–5.0)
Alkaline Phosphatase: 45 U/L (ref 38–126)
Anion gap: 6 (ref 5–15)
BUN: 48 mg/dL — ABNORMAL HIGH (ref 8–23)
CO2: 22 mmol/L (ref 22–32)
Calcium: 8.5 mg/dL — ABNORMAL LOW (ref 8.9–10.3)
Chloride: 108 mmol/L (ref 98–111)
Creatinine, Ser: 2.65 mg/dL — ABNORMAL HIGH (ref 0.61–1.24)
GFR, Estimated: 26 mL/min — ABNORMAL LOW (ref >60)
Glucose, Bld: 129 mg/dL — ABNORMAL HIGH (ref 70–99)
Potassium: 4.4 mmol/L (ref 3.5–5.1)
Sodium: 136 mmol/L (ref 135–145)
Total Bilirubin: 0.4 mg/dL (ref 0.3–1.2)
Total Protein: 8.3 g/dL — ABNORMAL HIGH (ref 6.5–8.1)

## 2020-08-01 LAB — URINALYSIS, COMPLETE (UACMP) WITH MICROSCOPIC
Bacteria, UA: NONE SEEN
Bilirubin Urine: NEGATIVE
Glucose, UA: >=500 mg/dL — AB
Ketones, ur: NEGATIVE mg/dL
Leukocytes,Ua: NEGATIVE
Nitrite: NEGATIVE
Protein, ur: NEGATIVE mg/dL
Specific Gravity, Urine: 1.01 (ref 1.005–1.030)
WBC, UA: NONE SEEN WBC/hpf (ref 0–5)
pH: 5 (ref 5.0–8.0)

## 2020-08-01 LAB — TROPONIN I (HIGH SENSITIVITY)
Troponin I (High Sensitivity): 16 ng/L (ref <18)
Troponin I (High Sensitivity): 15 ng/L (ref <18)

## 2020-08-01 LAB — TSH: TSH: 0.518 u[IU]/mL (ref 0.350–4.500)

## 2020-08-01 LAB — RESP PANEL BY RT-PCR (FLU A&B, COVID) ARPGX2
Influenza A by PCR: NEGATIVE
Influenza B by PCR: NEGATIVE
SARS Coronavirus 2 by RT PCR: NEGATIVE

## 2020-08-01 LAB — CBG MONITORING, ED: Glucose-Capillary: 91 mg/dL (ref 70–99)

## 2020-08-01 LAB — MAGNESIUM: Magnesium: 1.9 mg/dL (ref 1.7–2.4)

## 2020-08-01 LAB — PHOSPHORUS: Phosphorus: 4.4 mg/dL (ref 2.5–4.6)

## 2020-08-01 LAB — ETHANOL: Alcohol, Ethyl (B): <10 mg/dL (ref <10)

## 2020-08-01 LAB — GLUCOSE, CAPILLARY: Glucose-Capillary: 211 mg/dL — ABNORMAL HIGH (ref 70–99)

## 2020-08-01 IMAGING — CR DG CHEST 2V
2 series · 2 of 2 positions shown · non-contrast
Comparison: [DATE]; PET-CT [DATE]

CLINICAL DATA: Shortness of breath

EXAM:
CHEST - 2 VIEW

[chest lat]
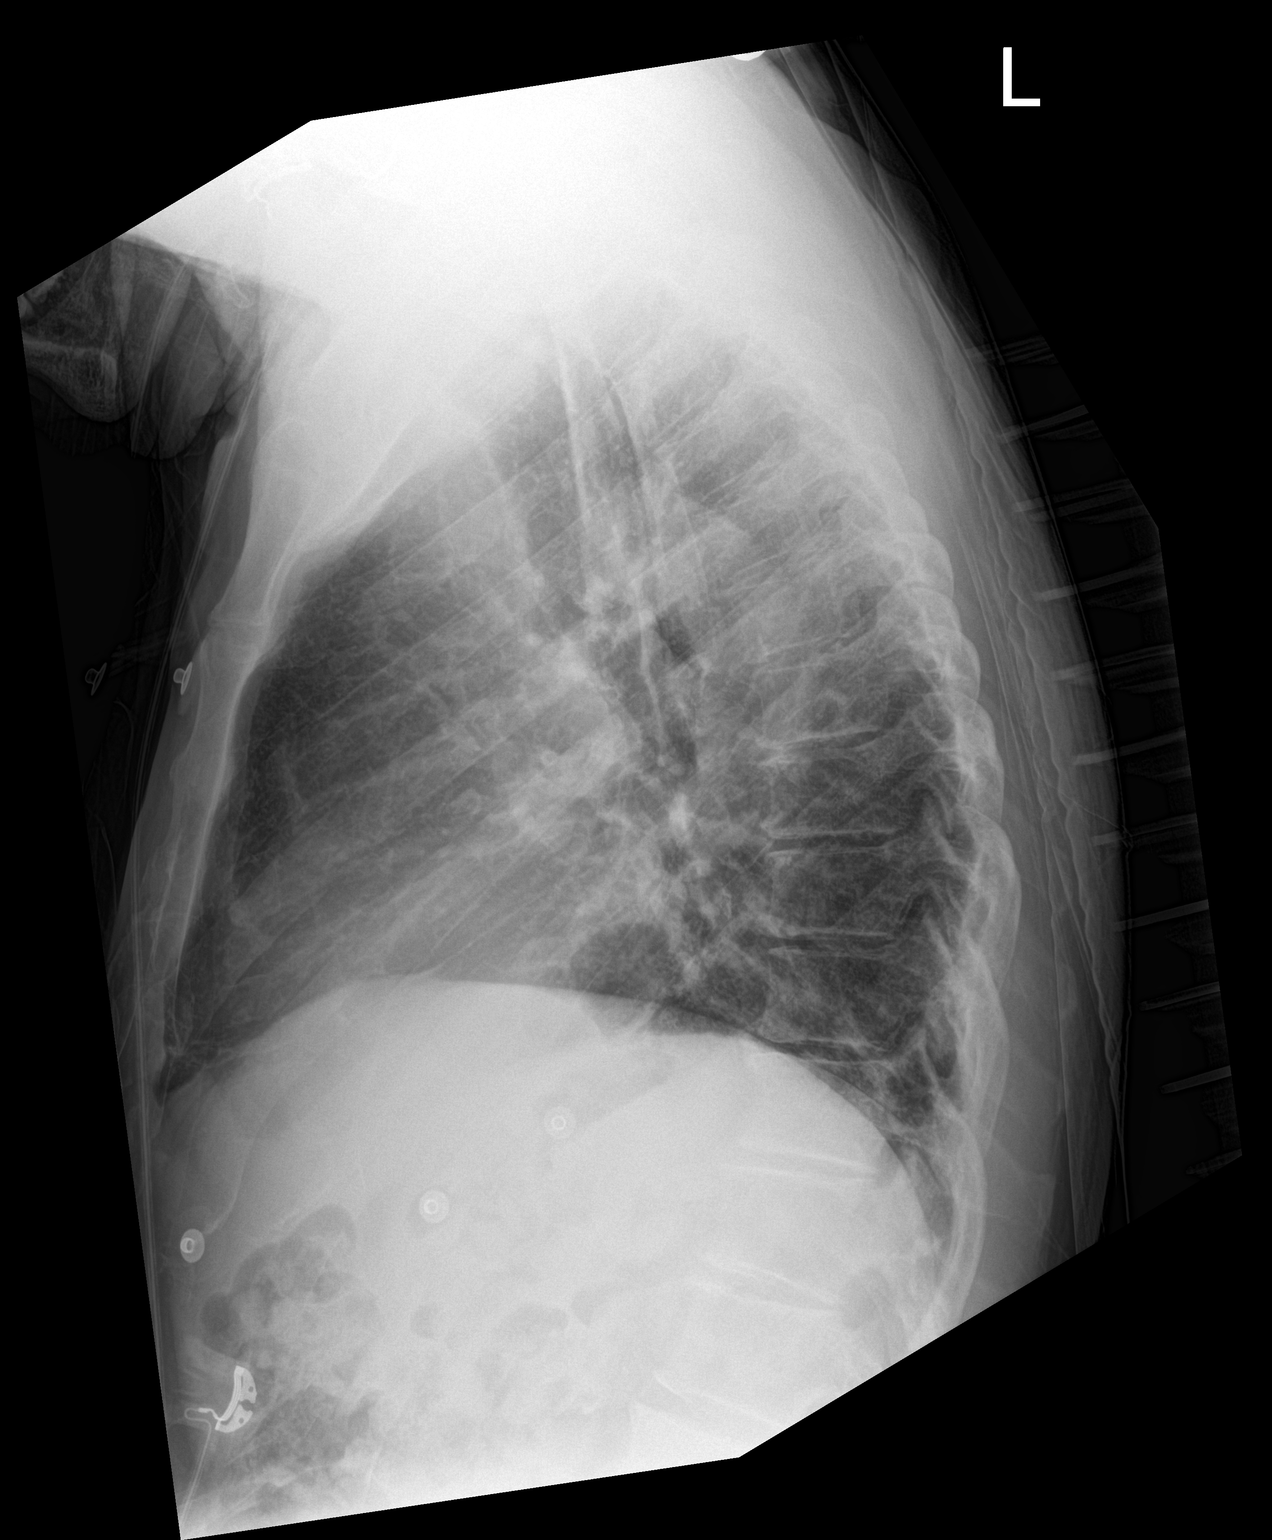

[chest ap]
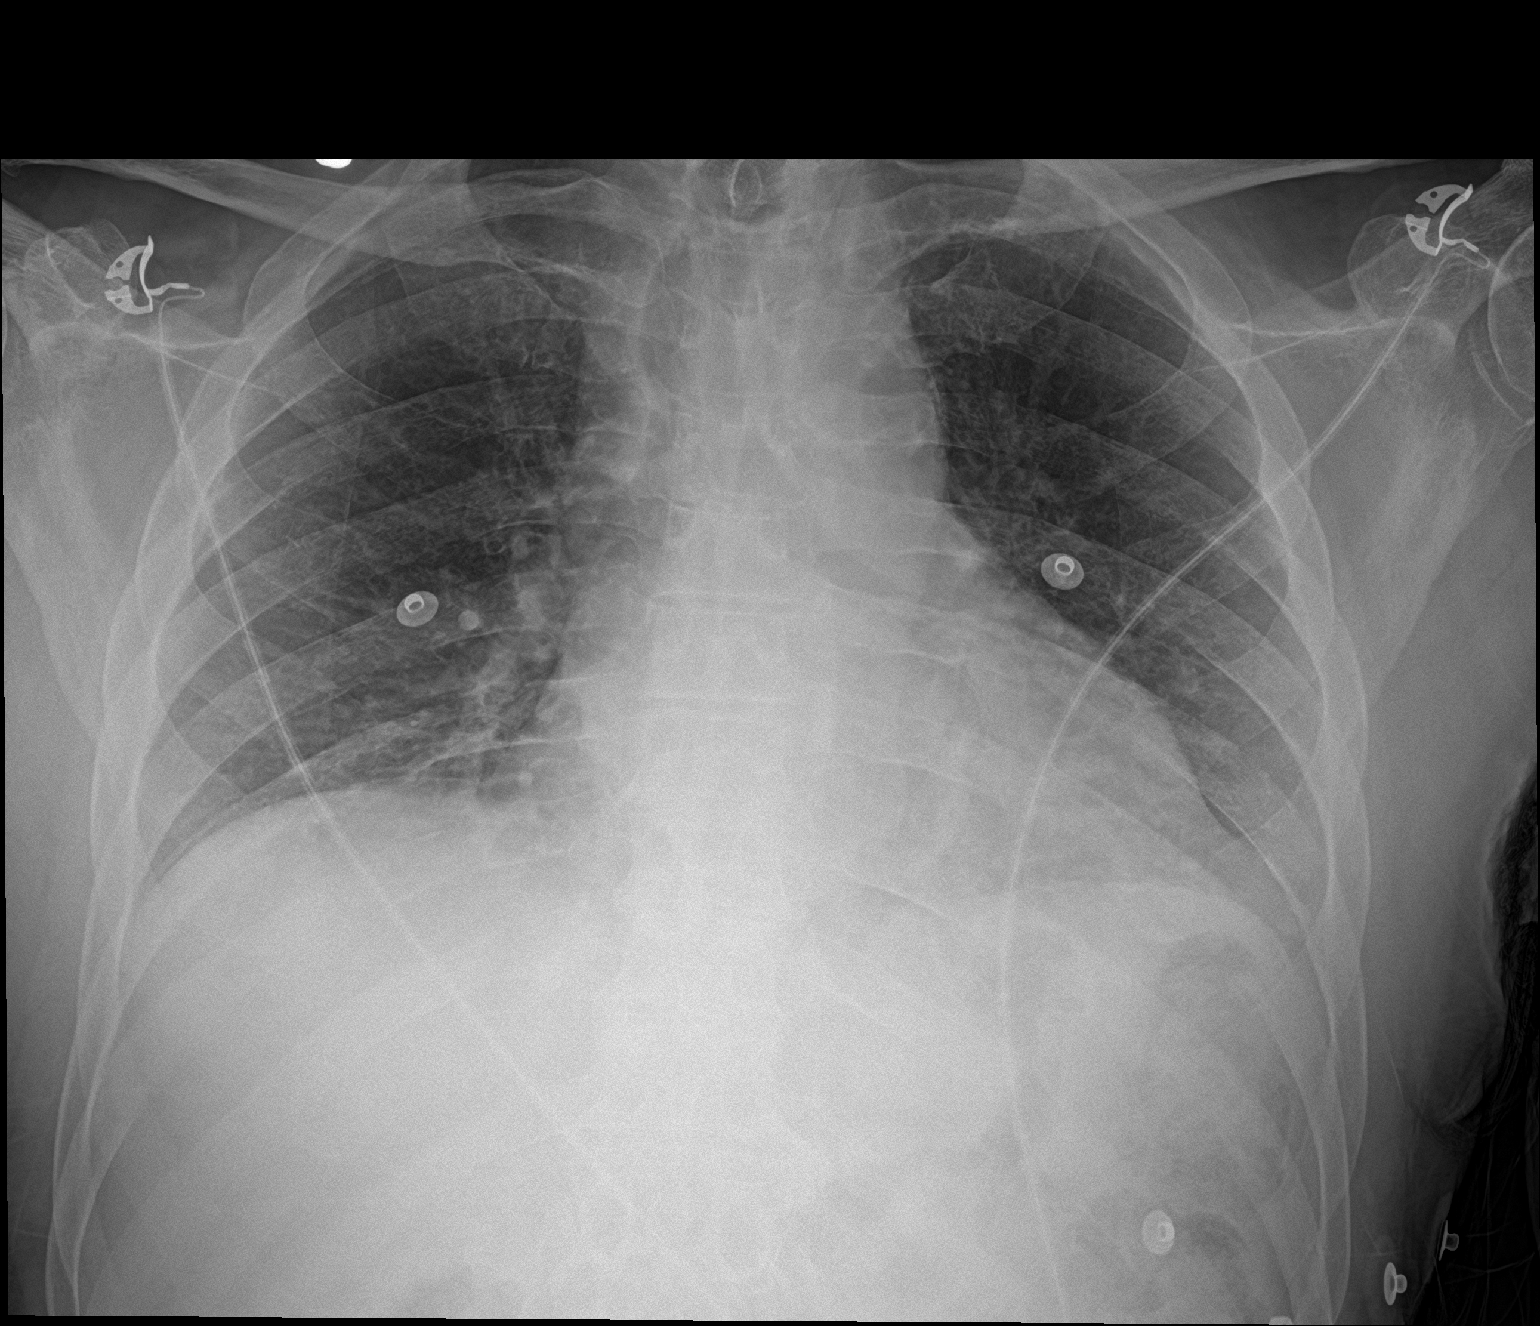

[2 of 2 positions shown; findings below may reference images not displayed]

FINDINGS: Lungs are clear. Heart is upper normal in size with pulmonary
vascularity normal. No adenopathy. No bone lesions appreciable.
IMPRESSION: Lungs clear.  Heart upper normal in size.

## 2020-08-01 IMAGING — CT CT HEAD W/O CM
3 of 5 series · 14 of 47 positions shown, 16 images · non-contrast
Comparison: [DATE]

CLINICAL DATA: Headache.  Dizziness.

EXAM:
CT HEAD WITHOUT CONTRAST
TECHNIQUE: Contiguous axial images were obtained from the base of the skull
through the vertex without intravenous contrast.

[Series 3: ax head wo · axial · 0.34mm/px · z∈[+345,+455]mm · 8 of 30 slices shown, 10 images]
[im 4/30  brain]
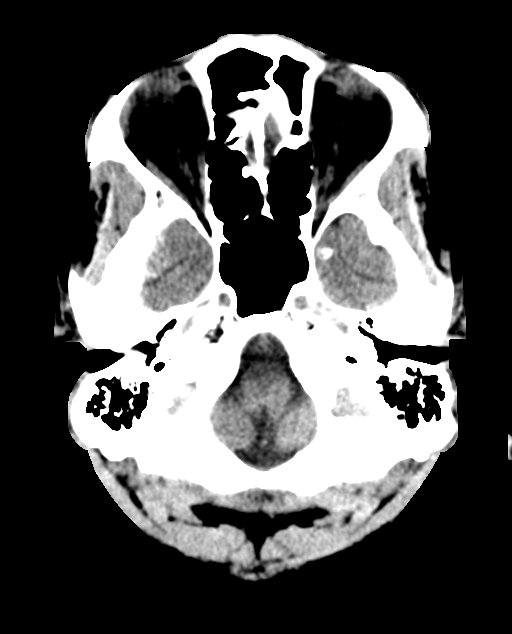
[im 4/30  bone]
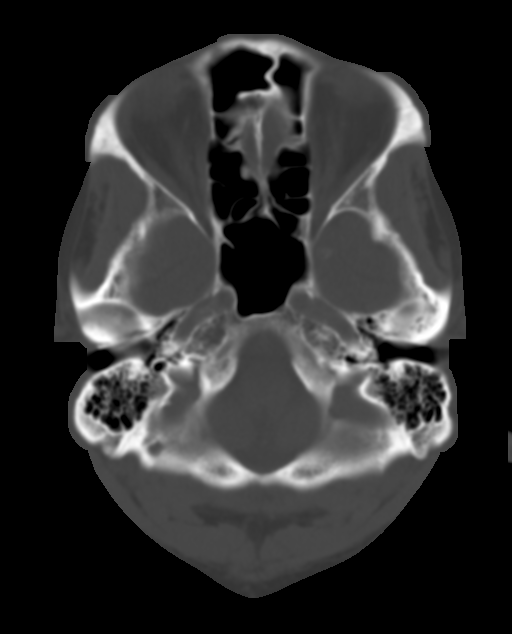
[im 7/30  brain]
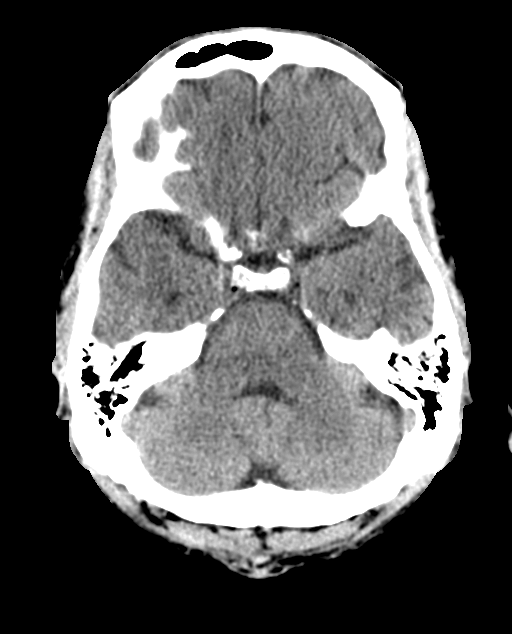
[im 10/30  brain]
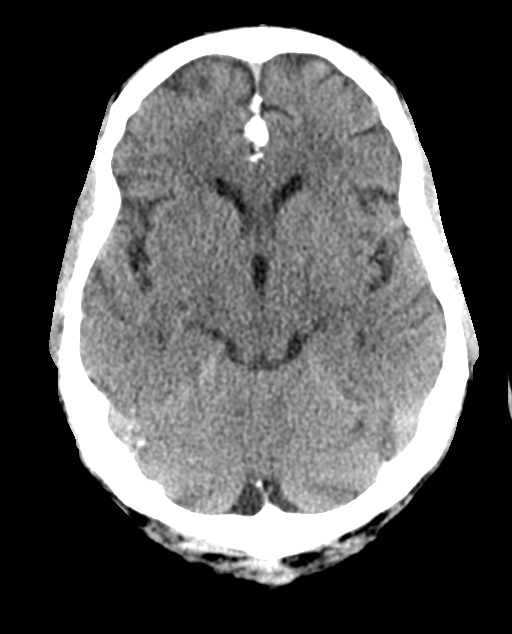
[im 13/30  brain]
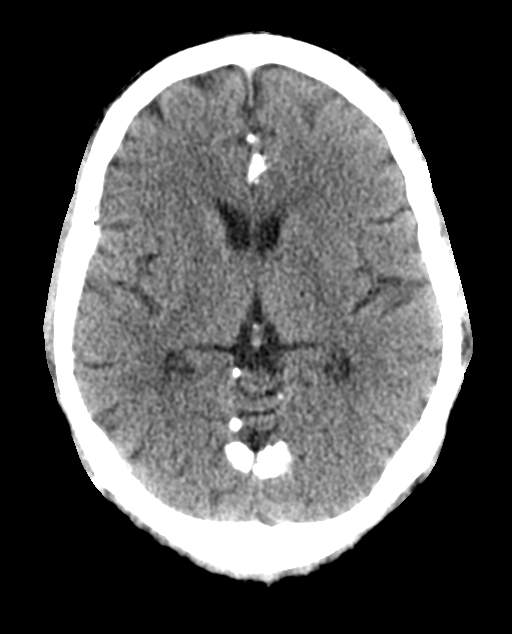
[im 17/30  brain]
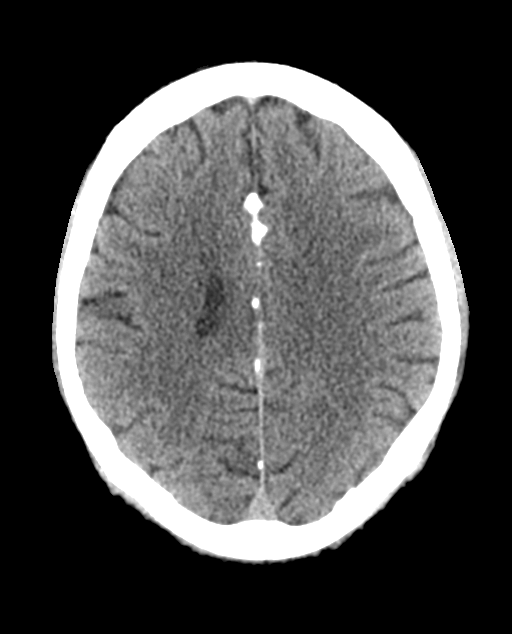
[im 17/30  bone]
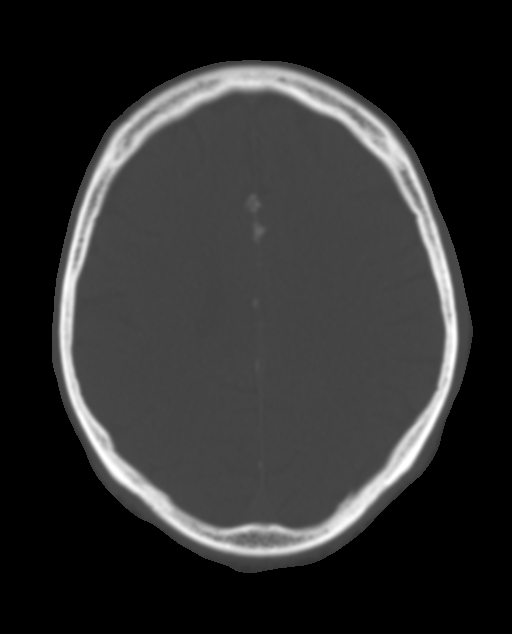
[im 20/30  brain]
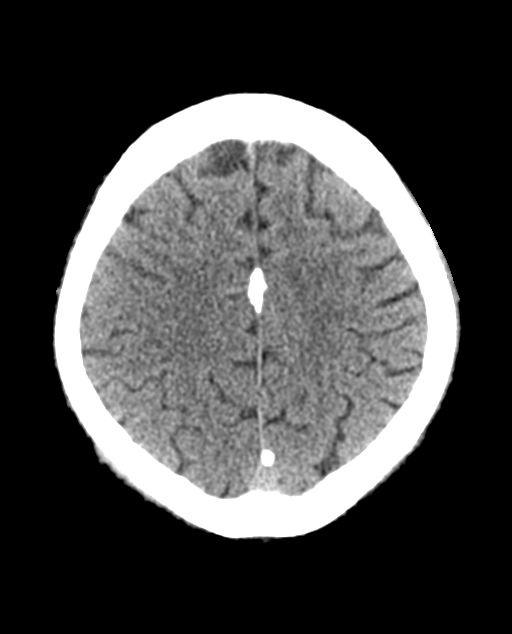
[im 23/30  brain]
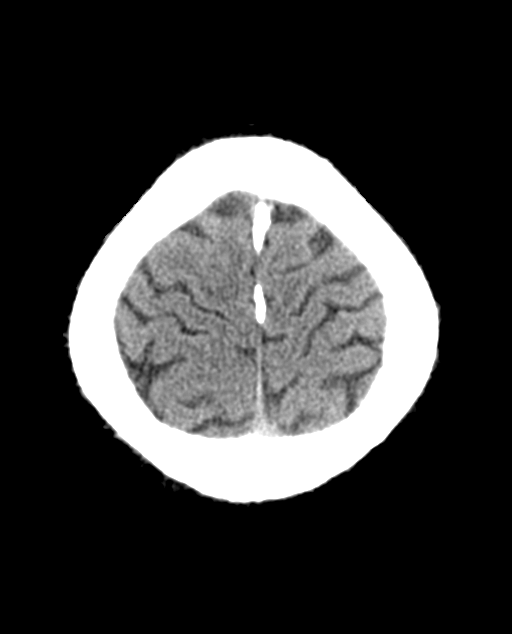
[im 26/30  brain]
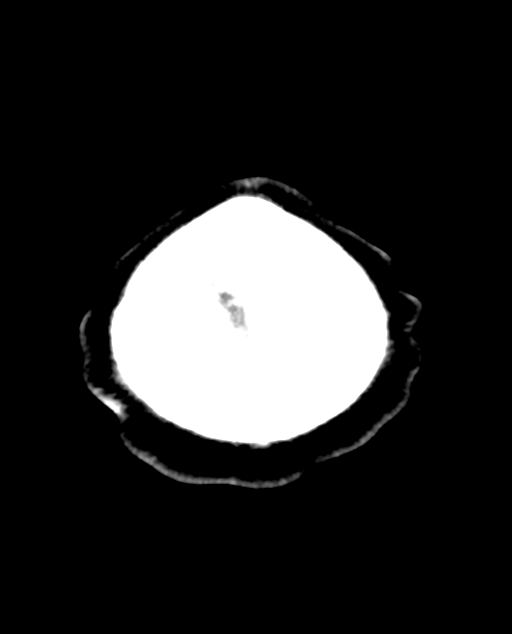

[Series 6: coronal soft tissue · coronal · 0.32mm/px · 3 of 38 slices shown]
[im 13/38  brain]
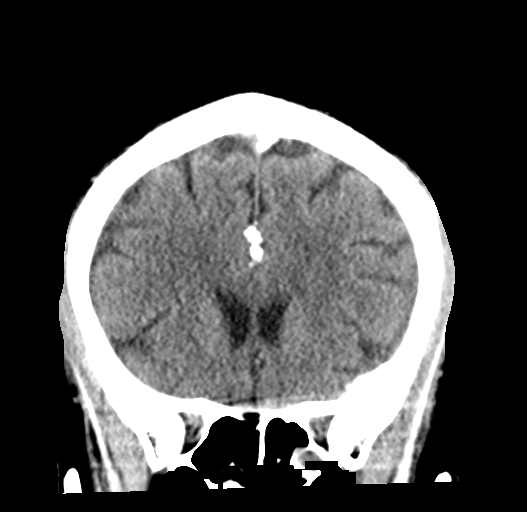
[im 17/38  brain]
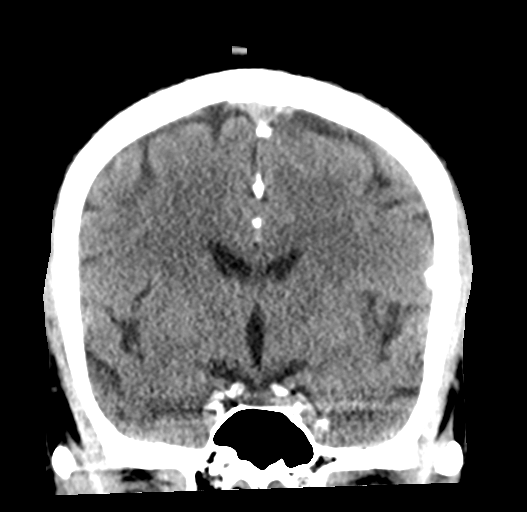
[im 21/38  brain]
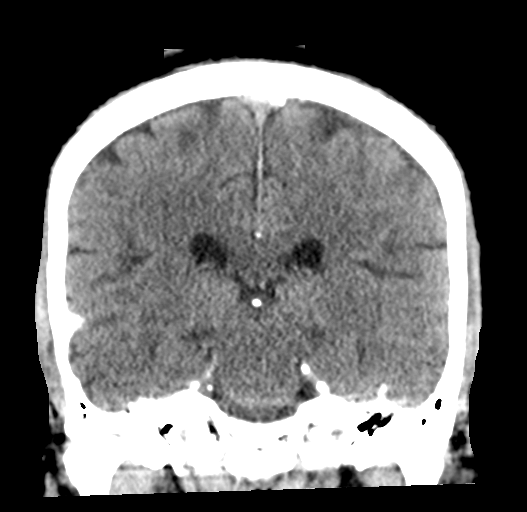

[Series 7: sagittal soft tissue · sagittal · 0.30mm/px · 3 of 31 slices shown]
[im 11/31  brain]
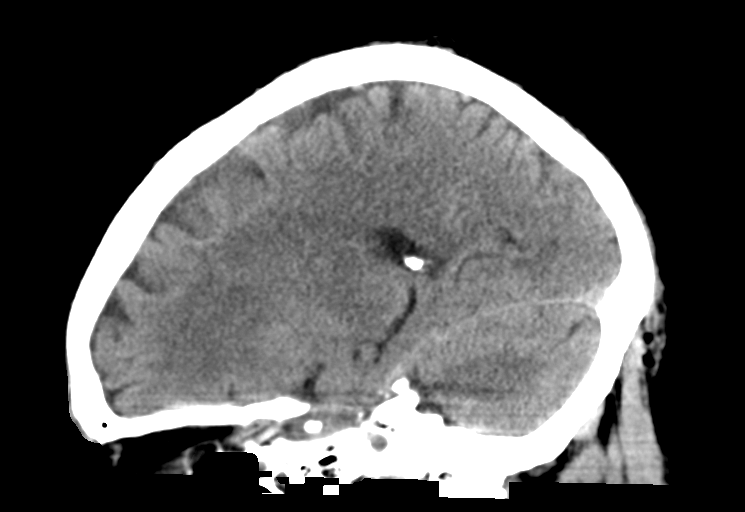
[im 16/31  brain]
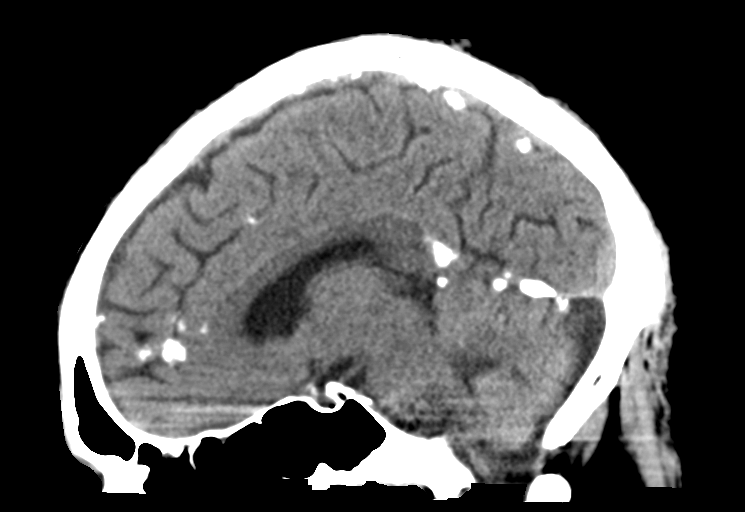
[im 21/31  brain]
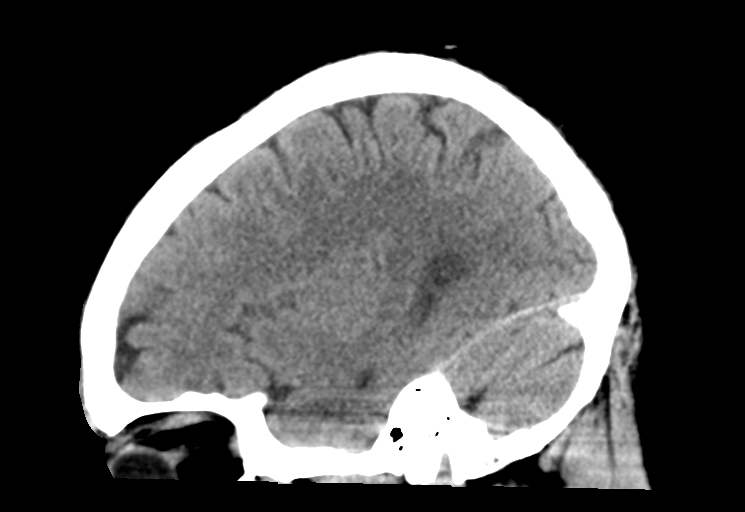

[14 of 47 positions shown; findings below may reference images not displayed]

FINDINGS: Brain: Stable dense dural calcifications. Normal size and position
of the ventricles. No intracranial hemorrhage, mass lesion or CT
evidence of acute infarction.

Vascular: No hyperdense vessel or unexpected calcification.

Skull: Normal. Negative for fracture or focal lesion.

Sinuses/Orbits: Stable bilateral ethmoid air cell opacification and
retention cyst. Unremarkable orbits.

Other: None.
IMPRESSION: No acute abnormality.

## 2020-08-01 MED ORDER — INSULIN DEGLUDEC 100 UNIT/ML ~~LOC~~ SOPN: 30.0000 [IU] | PEN_INJECTOR | Freq: Every day | SUBCUTANEOUS | Status: DC

## 2020-08-01 MED ORDER — GABAPENTIN 300 MG PO CAPS
300.0000 mg | ORAL_CAPSULE | Freq: Two times a day (BID) | ORAL | Status: DC
  Administered 2020-08-01: 300 mg via ORAL
  Filled 2020-08-01: qty 1

## 2020-08-01 MED ORDER — INSULIN ASPART 100 UNIT/ML IJ SOLN
0.0000 [IU] | Freq: Every day | INTRAMUSCULAR | Status: DC
  Administered 2020-08-01: 2 [IU] via SUBCUTANEOUS
  Filled 2020-08-01: qty 1

## 2020-08-01 MED ORDER — ACETAMINOPHEN 650 MG RE SUPP: 650.0000 mg | Freq: Four times a day (QID) | RECTAL | Status: DC | PRN

## 2020-08-01 MED ORDER — PANTOPRAZOLE SODIUM 40 MG PO TBEC: 80.0000 mg | DELAYED_RELEASE_TABLET | Freq: Every day | ORAL | Status: DC

## 2020-08-01 MED ORDER — INSULIN GLARGINE 100 UNIT/ML ~~LOC~~ SOLN
30.0000 [IU] | Freq: Every day | SUBCUTANEOUS | Status: DC
  Filled 2020-08-01: qty 0.3

## 2020-08-01 MED ORDER — ASPIRIN 81 MG PO CHEW
81.0000 mg | CHEWABLE_TABLET | Freq: Every day | ORAL | Status: DC
  Administered 2020-08-01: 22:00:00 81 mg via ORAL
  Filled 2020-08-01: qty 1

## 2020-08-01 MED ORDER — AMLODIPINE BESYLATE 10 MG PO TABS: 10.0000 mg | ORAL_TABLET | Freq: Every day | ORAL | Status: DC

## 2020-08-01 MED ORDER — PRAVASTATIN SODIUM 20 MG PO TABS
40.0000 mg | ORAL_TABLET | Freq: Every day | ORAL | Status: DC
  Filled 2020-08-01: qty 2
  Filled 2020-08-01: qty 1

## 2020-08-01 MED ORDER — HEPARIN SODIUM (PORCINE) 5000 UNIT/ML IJ SOLN
5000.0000 [IU] | Freq: Three times a day (TID) | INTRAMUSCULAR | Status: DC
  Administered 2020-08-01 – 2020-08-02 (×2): 5000 [IU] via SUBCUTANEOUS
  Filled 2020-08-01 (×2): qty 1

## 2020-08-01 MED ORDER — ONDANSETRON HCL 4 MG PO TABS: 4.0000 mg | ORAL_TABLET | Freq: Four times a day (QID) | ORAL | Status: DC | PRN

## 2020-08-01 MED ORDER — ONDANSETRON HCL 4 MG/2ML IJ SOLN: 4.0000 mg | Freq: Four times a day (QID) | INTRAMUSCULAR | Status: DC | PRN

## 2020-08-01 MED ORDER — SODIUM CHLORIDE 0.9% FLUSH
3.0000 mL | Freq: Two times a day (BID) | INTRAVENOUS | Status: DC
  Administered 2020-08-01 (×2): 3 mL via INTRAVENOUS

## 2020-08-01 MED ORDER — ACETAMINOPHEN 325 MG PO TABS
650.0000 mg | ORAL_TABLET | Freq: Four times a day (QID) | ORAL | Status: DC | PRN
  Administered 2020-08-01: 22:00:00 650 mg via ORAL
  Filled 2020-08-01: qty 2

## 2020-08-01 MED ORDER — SODIUM CHLORIDE 0.9 % IV SOLN: INTRAVENOUS | Status: DC

## 2020-08-01 MED ORDER — INSULIN ASPART 100 UNIT/ML IJ SOLN: 0.0000 [IU] | Freq: Three times a day (TID) | INTRAMUSCULAR | Status: DC

## 2020-08-01 NOTE — H&P (Signed)
History and Physical   Nicholas Weiss XIP:[id number] DOB: [birth year] DOA: 08/01/2020  PCP: Birdie Sons, MD  Outpatient Specialists: Dr. Janese Banks, hematology Patient coming from: Home via EMS  I have personally briefly reviewed patient's old medical records in [place].  Chief Concern: Dizziness  HPI: Nicholas Weiss is a 70 y.o. male with medical history significant for insulin-dependent diabetes mellitus, hypertension, smoldering multiple myeloma, chronic neutropenia/leukopenia, CKD 3B, anemia of chronic kidney disease, presents to the emergency department for chief concerns of dizziness and near syncopal episode.  Patient states that the dizziness lasts approximately 1 to 2 minutes, associated with some diaphoresis.  He states that he was out bringing the trash can and from the facility of his work when he experienced this episode.  He states that he never lost consciousness nor did he have an episode of collapse, or seizures.  He endorses profound weakness.  He reports that on 07/20/2020 the same thing happened.  He denies recent changes to medications.  At bedside, he is awake alert and oriented x4, he denies headaches, dysphagia, nausea, vomiting, chest pain, shortness of breath, abdominal pain, dysuria, hematuria, swelling in his legs.  He denies changes to his weight.  He reports that he has good p.o. intake and drinks a lot of water.  He states that the previous day he drank approximately 1 gallon of water and he has been drinking water today.  Social history: lives with girlfriend. He denies tobacco use, etoh, recreational drug use. 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   Vaccination history: he endorses covid 19 vaccination, three doses  ROS: Constitutional: no weight change, no fever ENT/Mouth: no sore throat, no rhinorrhea Eyes: no eye pain, no vision changes Cardiovascular: no chest pain, no dyspnea,  no edema, no palpitations Respiratory: no cough, no sputum, no  wheezing Gastrointestinal: no nausea, no vomiting, no diarrhea, no constipation Genitourinary: no urinary incontinence, no dysuria, no hematuria Musculoskeletal: no arthralgias, no myalgias Skin: no skin lesions, no pruritus, Neuro: + weakness, + dizziness, no loss of consciousness, no syncope Psych: no anxiety, no depression,  no decrease appetite Heme/Lymph: no bruising, no bleeding  ED Course: Discussed with EDP, patient requiring hospitalization for near syncopal event.  Vitals in the emergency department was remarkable for temperature of 98, respiration rate of 16, blood pressure 127/81, SPO2 of 100% on room air.  Labs in the emergency department was remarkable for sodium level 136, potassium 4.  4, chloride 108, bicarb 22, BUN 48, serum creatinine of 2.65, nonfasting blood glucose 128, WBC 2.5, hemoglobin 9.8, platelets 203, COVID is negative.  Per ED provider and ED nurse note, EMS arrived on the scene with blood pressure of 70/30, and after 1 L of fluid, his blood pressure was 140/80.  Assessment/Plan  Principal Problem:   Pre-syncope Active Problems:   Essential (primary) hypertension   AKI (acute kidney injury) (HCC)   CKD (chronic kidney disease) stage 3, GFR 30-59 ml/min (HCC)   Generalized weakness   Anemia of chronic kidney failure, stage 3 (moderate) (HCC)   Heart block AV first degree   Smoldering multiple myeloma (HCC)   Near syncope- etiology work-up in progress, query heat exhaustion versus progressing heart block - CT of the head without contrast was read as no acute abnormality - Echo complete - Status post normal saline 1 L bolus per EMS - Normal saline 125 mL/h for 1 day ordered - Check EtOH, UDS, B12 levels - Admit to MedSurg,  observation, with telemetry, 3 days ordered - Primary hospitalist team to consider cardiology consult pending echo result - Would recommend discharge with Holter monitor for 14 days with outpatient follow-up with PCP and/or  cardiology  First-degree heart block-with progressively increasing PR interval - EKG on 07/20/2020 showed a PR interval of 382, ED EKG on admission today showed PR interval of 408 - Complete echo ordered - We will follow high-sensitivity troponin  Acute kidney injury-query dehydration secondary to heat exhaustion from outdoor activity - Renal artery Doppler complete ordered - Nephrology, Dr. Holley Raring has been consulted via secure chat and via epic inpatient order consult - N.p.o. after midnight to reduce bowel gas - BMP in the a.m.  Insulin-dependent diabetes mellitus-last A1c was 8.1 on 06/14/2020 -Last A1c was 06/14/2020: 8.8 - Insulin glargine 30 units subcutaneous daily for 5/6, patient states he had his dose on 5/5 already - Insulin SSI for renal sensitivity with at bedtime coverage ordered  Hypertension-amlodipine 10 mg daily resumed for 08/02/2020  Hyperlipidemia- pravastatin 40 mg nightly resumed  Peripheral neuropathy-gabapentin 300 mg p.o. twice daily  Smoldering multiple myeloma Chronic neutropenia  - At baseline - Outpatient follow-up  Chart reviewed.   05/14/2020: Patient had an outpatient nuclear medicine PET scan from skull base to thigh and was read as no hypermetabolic lytic myelomatous lesions are identified  DVT prophylaxis: Heparin 5000 units subcutaneous, every 8 hours Code Status: Full code Diet: Heart/carb modified healthy Family Communication: No Disposition Plan: Pending clinical course Consults called: Nephrology Admission status: MedSurg, observation, with telemetry  Past Medical History:  Diagnosis Date  . DKA (diabetic ketoacidoses) 04/06/2016  . Hypercholesteremia   . Hypertension    Past Surgical History:  Procedure Laterality Date  . COLONOSCOPY WITH PROPOFOL N/A 02/01/2020   Procedure: COLONOSCOPY WITH PROPOFOL;  Surgeon: Lin Landsman, MD;  [facility] ENDOSCOPY;  Service: Gastroenterology;  Laterality: N/A;  .  ESOPHAGOGASTRODUODENOSCOPY  02/01/2020   Procedure: ESOPHAGOGASTRODUODENOSCOPY (EGD);  Surgeon: Lin Landsman, MD;  Location: [facility] ENDOSCOPY;  Service: Gastroenterology;;  . KNEE SURGERY Right    Torn meniscus  . KNEE SURGERY Left    Social History:  reports that he has never smoked. He has never used smokeless tobacco. He reports current alcohol use. He reports previous drug use. Drug: Marijuana.  Allergies  Allergen Reactions  . Penicillins Other (See Comments)    Has patient had a PCN reaction causing immediate rash, facial/tongue/throat swelling, SOB or lightheadedness with hypotension: No Has patient had a PCN reaction causing severe rash involving mucus membranes or skin necrosis: No Has patient had a PCN reaction that required hospitalization No Has patient had a PCN reaction occurring within the last 10 years: No If all of the above answers are "NO", then may proceed with Cephalosporin use.    Family History  Problem Relation Age of Onset  . Heart attack Father   . Hypertension Sister   . Cancer Sister    Family history: Family history reviewed and not pertinent  Prior to Admission medications   Medication Sig Start Date End Date Taking? Authorizing Provider  Alcohol Swabs (B-D SINGLE USE SWABS REGULAR) PADS Use to check blood sugar four times daily for type 1 diabetes 07/11/20   Birdie Sons, MD  amLODipine (NORVASC) 10 MG tablet Take 1 tablet (10 mg total) by mouth daily. 06/25/20   Birdie Sons, MD  aspirin 81 MG chewable tablet Chew 1 tablet by mouth daily.    [provider]  Blood Glucose  Calibration (TRUE METRIX LEVEL 1) Low SOLN Use to check blood sugar four times daily for type 1 diabetes 07/11/20   Birdie Sons, MD  insulin aspart (NOVOLOG) 100 UNIT/ML injection Inject up to 14 units three times daily before meals according to sliding scale 06/25/20   Birdie Sons, MD  insulin degludec (TRESIBA FLEXTOUCH) 100 UNIT/ML FlexTouch Pen INJECT 30  UNITS SUBCUTANEOUSLY ONCE DAILY. INCREASE AS DIRECTED TO KEEP FASTING BLOOD SUGAR LESS THAN 120. 06/25/20   Birdie Sons, MD  Insulin Syringe-Needle U-100 (INSULIN SYRINGE 1CC/31GX5/16") 31G X 5/16" 1 ML MISC For insulin injections up to 4 times daily 07/11/20   Birdie Sons, MD  lovastatin (MEVACOR) 40 MG tablet Take 1 tablet (40 mg total) by mouth at bedtime. 08/02/18   Birdie Sons, MD  omeprazole (PRILOSEC) 40 MG capsule Take 1 capsule (40 mg total) by mouth 2 (two) times daily before a meal. 02/01/20 03/02/20  Vanga, Tally Due, MD  pregabalin (LYRICA) 75 MG capsule Take 1 capsule (75 mg total) by mouth 2 (two) times daily. 06/21/20   Birdie Sons, MD   Physical Exam: Vitals:   08/01/20 1705 08/01/20 1708 08/01/20 1710 08/01/20 1937  BP: (!) 151/85 (!) 147/92 (!) 145/91 (!) 155/91  Pulse: 73 75 80 79  Resp:    14  Temp: 97.8 F (36.6 C)   97.9 F (36.6 C)  TempSrc:      SpO2: 100% 100% 100% 100%  Weight:      Height:       Constitutional: appears age appropriate, NAD, calm, comfortable Eyes: PERRL, lids and conjunctivae normal ENMT: Mucous membranes are moist. Posterior pharynx clear of any exudate or lesions. Age-appropriate dentition. Hearing appropriate Neck: normal, supple, no masses, no thyromegaly Respiratory: clear to auscultation bilaterally, no wheezing, no crackles. Normal respiratory effort. No accessory muscle use.  Cardiovascular: Regular rate and rhythm, no murmurs / rubs / gallops. No extremity edema. 2+ pedal pulses. No carotid bruits.  Abdomen: no tenderness, no masses palpated, no hepatosplenomegaly. Bowel sounds positive.  Musculoskeletal: no clubbing / cyanosis. No joint deformity upper and lower extremities. Good ROM, no contractures, no atrophy. Normal muscle tone.  Skin: no rashes, lesions, ulcers. No induration Neurologic: Sensation intact. Strength 5/5 in all 4. Flat affect Psychiatric: Normal judgment and insight. Alert and oriented x 3. Normal  mood.   EKG: independently reviewed, showing sinus rhythm with rate of 70, first-degree block, QTC 447.  PR interval 408.  Previous EKG on 07/20/2020 had a PR interval of 382  Chest x-ray on Admission: I personally reviewed and I agree with radiologist reading as below.  DG Chest 2 View  Result Date: 08/01/2020 CLINICAL DATA:  Shortness of breath EXAM: CHEST - 2 VIEW COMPARISON:  November 06, 2019; PET-CT May 14, 2020 FINDINGS: Lungs are clear. Heart is upper normal in size with pulmonary vascularity normal. No adenopathy. No bone lesions appreciable. IMPRESSION: Lungs clear.  Heart upper normal in size. Electronically Signed   By: Lowella Grip III M.D.   On: 08/01/2020 14:37   CT Head Wo Contrast  Result Date: 08/01/2020 CLINICAL DATA:  Headache.  Dizziness. EXAM: CT HEAD WITHOUT CONTRAST TECHNIQUE: Contiguous axial images were obtained from the base of the skull through the vertex without intravenous contrast. COMPARISON:  07/29/2019 FINDINGS: Brain: Stable dense dural calcifications. Normal size and position of the ventricles. No intracranial hemorrhage, mass lesion or CT evidence of acute infarction. Vascular: No hyperdense vessel or unexpected calcification. Skull:  Normal. Negative for fracture or focal lesion. Sinuses/Orbits: Stable bilateral ethmoid air cell opacification and retention cyst. Unremarkable orbits. Other: None. IMPRESSION: No acute abnormality. Electronically Signed   By: Claudie Revering M.D.   On: 08/01/2020 15:28   Labs on Admission: I have personally reviewed following labs  CBC: Recent Labs  Lab 08/01/20 1320  WBC 2.5*  NEUTROABS 1.1*  HGB 9.7*  HCT 29.7*  MCV 93.7  PLT 342   Basic Metabolic Panel: Recent Labs  Lab 08/01/20 1320 08/01/20 1610  NA 136  --   K 4.4  --   CL 108  --   CO2 22  --   GLUCOSE 129*  --   BUN 48*  --   CREATININE 2.65*  --   CALCIUM 8.5*  --   MG 1.9  --   PHOS  --  4.4   GFR: Estimated Creatinine Clearance: 27.9 mL/min  (A) (by C-G formula based on SCr of 2.65 mg/dL (H)).  Liver Function Tests: Recent Labs  Lab 08/01/20 1320  AST 13*  ALT 11  ALKPHOS 45  BILITOT 0.4  PROT 8.3*  ALBUMIN 3.1*   CBG: Recent Labs  Lab 08/01/20 1415 08/01/20 2108  GLUCAP 91 211*   Urine analysis:    Component Value Date/Time   COLORURINE YELLOW (A) 08/01/2020 1320   APPEARANCEUR CLEAR (A) 08/01/2020 1320   LABSPEC 1.010 08/01/2020 1320   PHURINE 5.0 08/01/2020 1320   GLUCOSEU >=500 (A) 08/01/2020 1320   HGBUR SMALL (A) 08/01/2020 1320   BILIRUBINUR NEGATIVE 08/01/2020 [place] 08/01/2020 1320   PROTEINUR NEGATIVE 08/01/2020 1320   NITRITE NEGATIVE 08/01/2020 1320   LEUKOCYTESUR NEGATIVE 08/01/2020 1320   Ontario Pettengill N Jabar Krysiak D.O. [facility]  If 7PM-7AM, please contact overnight-coverage provider If 7AM-7PM, please contact day coverage provider [email or web address]  08/02/2020, 12:02 AM

## 2020-08-01 NOTE — ED Triage Notes (Signed)
Pt coming from work , stated he felt dizzy while he was taking the trash co-worker called EMS.  EMS BP on scene 70/30 , after 1L fluid 140/80. EMS states on there assessment Neg stroke scale

## 2020-08-01 NOTE — ED Notes (Signed)
Patient transported to CT 

## 2020-08-01 NOTE — ED Provider Notes (Signed)
[facility] Emergency Department Provider Note  ____________________________________________   Event Date/Time   First MD Initiated Contact with Patient 08/01/20 1312     (approximate)  I have reviewed the triage vital signs and the nursing notes.   HISTORY  Chief Complaint Hypotension (Pt coming from work. EMS was called out for generalized weakness)    HPI Nicholas Weiss is a 70 y.o. male with diabetes, hypertension, hyperlipidemia who comes in with weakness.  Patient states that he was at work and was walking towards the trash can when he started feeling dizzy like he was going to pass out so he laid his head down on the top of the trash can.  He denies losing consciousness or blacking out.  A coworker found him hunched over the trash can and brought him inside and noted that he seemed more lethargic therefore called EMS.  EMS found him to be hypotensive in the 60Y systolic and gave him a liter of fluid.  Patient denies any chest pain, no abdominal pain, maybe some mild shortness of breath initially but now resolved.  No leg swelling or other concerns  On review of records patient came in on 4/23 for low blood pressures as well.  Patient was given 1 L of fluid and seems to be doing better.            Past Medical History:  Diagnosis Date  . DKA (diabetic ketoacidoses) 04/06/2016  . Hypercholesteremia   . Hypertension     Patient Active Problem List   Diagnosis Date Noted  . Anemia of chronic kidney failure, stage 3 (moderate) ([place]) 05/28/2020  . PUD (peptic ulcer disease)   . Esophageal dysphagia   . Encounter for screening colonoscopy   . Hyperglycemia due to type 2 diabetes mellitus ([place]) 07/28/2019  . Generalized weakness 07/28/2019  . Type 2 diabetes mellitus with hyperglycemia ([place]) 07/28/2019  . Hypoglycemia 04/27/2019  . Unresponsiveness 04/27/2019  . Lung nodule 04/07/2019  . Uncontrolled type 1 diabetes mellitus ([place])   . Acute  renal failure with acute tubular necrosis superimposed on stage 3a chronic kidney disease ([place])   . Hypertensive urgency 04/05/2019  . AKI (acute kidney injury) ([place]) 04/05/2019  . CKD (chronic kidney disease) stage 3, GFR 30-59 ml/min (HCC) 04/05/2019  . DKA (diabetic ketoacidoses) 04/06/2016  . Hypercholesterolemia 01/24/2016  . Essential (primary) hypertension 06/07/2006  . Type 1 diabetes mellitus with diabetic neuropathy, unspecified ([place]) 05/31/2006    Past Surgical History:  Procedure Laterality Date  . COLONOSCOPY WITH PROPOFOL N/A 02/01/2020   Procedure: COLONOSCOPY WITH PROPOFOL;  Surgeon: Lin Landsman, MD;  Location: [facility] ENDOSCOPY;  Service: Gastroenterology;  Laterality: N/A;  . ESOPHAGOGASTRODUODENOSCOPY  02/01/2020   Procedure: ESOPHAGOGASTRODUODENOSCOPY (EGD);  Surgeon: Lin Landsman, MD;  Location: [facility] ENDOSCOPY;  Service: Gastroenterology;;  . KNEE SURGERY Right    Torn meniscus  . KNEE SURGERY Left     Prior to Admission medications   Medication Sig Start Date End Date Taking? Authorizing Provider  Alcohol Swabs (B-D SINGLE USE SWABS REGULAR) PADS Use to check blood sugar four times daily for type 1 diabetes 07/11/20   Birdie Sons, MD  amLODipine (NORVASC) 10 MG tablet Take 1 tablet (10 mg total) by mouth daily. 06/25/20   Birdie Sons, MD  aspirin 81 MG chewable tablet Chew 1 tablet by mouth daily.    [provider]  Blood Glucose Calibration (TRUE METRIX LEVEL 1) Low SOLN Use to check blood  sugar four times daily for type 1 diabetes 07/11/20   Birdie Sons, MD  insulin aspart (NOVOLOG) 100 UNIT/ML injection Inject up to 14 units three times daily before meals according to sliding scale 06/25/20   Birdie Sons, MD  insulin degludec (TRESIBA FLEXTOUCH) 100 UNIT/ML FlexTouch Pen INJECT 30 UNITS SUBCUTANEOUSLY ONCE DAILY. INCREASE AS DIRECTED TO KEEP FASTING BLOOD SUGAR LESS THAN 120. 06/25/20   Birdie Sons, MD  Insulin  Syringe-Needle U-100 (INSULIN SYRINGE 1CC/31GX5/16") 31G X 5/16" 1 ML MISC For insulin injections up to 4 times daily 07/11/20   Birdie Sons, MD  lovastatin (MEVACOR) 40 MG tablet Take 1 tablet (40 mg total) by mouth at bedtime. 08/02/18   Birdie Sons, MD  omeprazole (PRILOSEC) 40 MG capsule Take 1 capsule (40 mg total) by mouth 2 (two) times daily before a meal. 02/01/20 03/02/20  Vanga, Tally Due, MD  pregabalin (LYRICA) 75 MG capsule Take 1 capsule (75 mg total) by mouth 2 (two) times daily. 06/21/20   Birdie Sons, MD    Allergies Penicillins  Family History  Problem Relation Age of Onset  . Heart attack Father   . Hypertension Sister   . Cancer Sister     Social History Social History   Tobacco Use  . Smoking status: Never Smoker  . Smokeless tobacco: Never Used  Vaping Use  . Vaping Use: Never used  Substance Use Topics  . Alcohol use: Yes    Alcohol/week: 0.0 - 1.0 standard drinks    Comment: "once every 2 months"  . Drug use: Not Currently    Types: Marijuana    Comment: "none in a long time'      Review of Systems Constitutional: No fever/chills +lightheadness  Eyes: No visual changes. ENT: No sore throat. Cardiovascular: Denies chest pain. Respiratory: Denies shortness of breath. Gastrointestinal: No abdominal pain.  No nausea, no vomiting.  No diarrhea.  No constipation. Genitourinary: Negative for dysuria. Musculoskeletal: Negative for back pain. Skin: Negative for rash. Neurological: Negative for headaches, focal weakness or numbness. All other ROS negative ____________________________________________   PHYSICAL EXAM:  VITAL SIGNS: Blood pressure (!) 144/78, pulse 75, temperature 98 F (36.7 C), temperature source Oral, resp. rate 12, height 5\' 10"  (1.778 m), weight 80.7 kg, SpO2 99 %.  Constitutional: Alert and oriented.  Appears tired but does awaken and answers questions Eyes: Conjunctivae are normal. EOMI. Head: Atraumatic. Nose:  No congestion/rhinnorhea. Mouth/Throat: Mucous membranes are moist.   Neck: No stridor. Trachea Midline. FROM Cardiovascular: Normal rate, regular rhythm. Grossly normal heart sounds.  Good peripheral circulation. Respiratory: Normal respiratory effort.  No retractions. Lungs CTAB. Gastrointestinal: Soft and nontender. No distention. No abdominal bruits.  Musculoskeletal: No lower extremity tenderness nor edema.  No joint effusions. Neurologic:  Normal speech and language. No gross focal neurologic deficits are appreciated.  Cranial nerves appear intact.  Equal strength in arms and legs Skin:  Skin is warm, dry and intact. No rash noted. Psychiatric: Mood and affect are normal. Speech and behavior are normal. GU: Deferred   ____________________________________________   LABS (all labs ordered are listed, but only abnormal results are displayed)  Labs Reviewed  CBC WITH DIFFERENTIAL/PLATELET - Abnormal; Notable for the following components:      Result Value   WBC 2.5 (*)    RBC 3.17 (*)    Hemoglobin 9.7 (*)    HCT 29.7 (*)    Neutro Abs 1.1 (*)    All other components within  normal limits  COMPREHENSIVE METABOLIC PANEL - Abnormal; Notable for the following components:   Glucose, Bld 129 (*)    BUN 48 (*)    Creatinine, Ser 2.65 (*)    Calcium 8.5 (*)    Total Protein 8.3 (*)    Albumin 3.1 (*)    AST 13 (*)    GFR, Estimated 26 (*)    All other components within normal limits  RESP PANEL BY RT-PCR (FLU A&B, COVID) ARPGX2  MAGNESIUM  ETHANOL  URINALYSIS, COMPLETE (UACMP) WITH MICROSCOPIC  URINE DRUG SCREEN, QUALITATIVE ([facility] ONLY)  CBG MONITORING, ED  TROPONIN I (HIGH SENSITIVITY)  TROPONIN I (HIGH SENSITIVITY)   ____________________________________________   ED ECG REPORT I, Vanessa Ina, the attending physician, personally viewed and interpreted this ECG.  Normal sinus rate of 70, no ST elevation, first-degree AV block, normal  intervals ____________________________________________  RADIOLOGY Robert Bellow, personally viewed and evaluated these images (plain radiographs) as part of my medical decision making, as well as reviewing the written report by the radiologist.  ED MD interpretation: No pneumonia  Official radiology report(s): DG Chest 2 View  Result Date: 08/01/2020 CLINICAL DATA:  Shortness of breath EXAM: CHEST - 2 VIEW COMPARISON:  November 06, 2019; PET-CT May 14, 2020 FINDINGS: Lungs are clear. Heart is upper normal in size with pulmonary vascularity normal. No adenopathy. No bone lesions appreciable. IMPRESSION: Lungs clear.  Heart upper normal in size. Electronically Signed   By: Lowella Grip III M.D.   On: 08/01/2020 14:37   CT Head Wo Contrast  Result Date: 08/01/2020 CLINICAL DATA:  Headache.  Dizziness. EXAM: CT HEAD WITHOUT CONTRAST TECHNIQUE: Contiguous axial images were obtained from the base of the skull through the vertex without intravenous contrast. COMPARISON:  07/29/2019 FINDINGS: Brain: Stable dense dural calcifications. Normal size and position of the ventricles. No intracranial hemorrhage, mass lesion or CT evidence of acute infarction. Vascular: No hyperdense vessel or unexpected calcification. Skull: Normal. Negative for fracture or focal lesion. Sinuses/Orbits: Stable bilateral ethmoid air cell opacification and retention cyst. Unremarkable orbits. Other: None. IMPRESSION: No acute abnormality. Electronically Signed   By: Claudie Revering M.D.   On: 08/01/2020 15:28    ____________________________________________   PROCEDURES  Procedure(s) performed (including Critical Care):  Procedures   ____________________________________________   INITIAL IMPRESSION / ASSESSMENT AND PLAN / ED COURSE  HEZAKIAH CHAMPEAU was evaluated in Emergency Department on 08/01/2020 for the symptoms described in the history of present illness. He was evaluated in the context of the global COVID-19  pandemic, which necessitated consideration that the patient might be at risk for infection with the SARS-CoV-2 virus that causes COVID-19. Institutional protocols and algorithms that pertain to the evaluation of patients at risk for COVID-19 are in a state of rapid change based on information released by regulatory bodies including the CDC and federal and state organizations. These policies and algorithms were followed during the patient's care in the ED.    Patient comes in with near syncopal episode.  On review of records he had another syncopal episode recently and was also hypotensive during that.  Denies any reason to be hypotensive.  States that he is eating and drinking well.  Denies any alcohol or drug use.  Given this is the second time this been occurring to elderly male I think it would be best to admit him for echocardiogram and cardiac monitoring.  His EKG does show type I AV block but his cardiac markers are without evidence  of ACS.  Labs were also ordered to make sure there is no signs of dehydration, AKI, UTI.  Patient had been given a liter of fluid and orthostatics are now negative.  Given this is patient's second time with this happening will admit to the hospital team       ____________________________________________   FINAL CLINICAL IMPRESSION(S) / ED DIAGNOSES   Final diagnoses:  Near syncope  M[place] type 1 second degree AV block      MEDICATIONS GIVEN DURING THIS VISIT:  Medications - No data to display   ED Discharge Orders    None       Note:  This document was prepared using Dragon voice recognition software and may include unintentional dictation errors.   Vanessa Lorton, MD 08/01/20 [phone]

## 2020-08-01 NOTE — ED Notes (Signed)
Informed RN bed assigned [id number]

## 2020-08-02 ENCOUNTER — Telehealth: Payer: Self-pay | Admitting: *Deleted

## 2020-08-02 ENCOUNTER — Observation Stay: Payer: Medicare HMO

## 2020-08-02 ENCOUNTER — Observation Stay: Admit: 2020-08-02 | Payer: Medicare HMO

## 2020-08-02 ENCOUNTER — Other Ambulatory Visit: Payer: Self-pay

## 2020-08-02 DIAGNOSIS — N183 Chronic kidney disease, stage 3 unspecified: Secondary | ICD-10-CM

## 2020-08-02 DIAGNOSIS — Z0100 Encounter for examination of eyes and vision without abnormal findings: Secondary | ICD-10-CM | POA: Diagnosis not present

## 2020-08-02 DIAGNOSIS — H4921 Sixth [abducent] nerve palsy, right eye: Secondary | ICD-10-CM | POA: Diagnosis not present

## 2020-08-02 DIAGNOSIS — D472 Monoclonal gammopathy: Secondary | ICD-10-CM

## 2020-08-02 DIAGNOSIS — C9000 Multiple myeloma not having achieved remission: Secondary | ICD-10-CM

## 2020-08-02 DIAGNOSIS — N179 Acute kidney failure, unspecified: Secondary | ICD-10-CM | POA: Diagnosis not present

## 2020-08-02 DIAGNOSIS — R55 Syncope and collapse: Secondary | ICD-10-CM | POA: Diagnosis not present

## 2020-08-02 LAB — CBC
HCT: 29.7 % — ABNORMAL LOW (ref 39.0–52.0)
Hemoglobin: 10.1 g/dL — ABNORMAL LOW (ref 13.0–17.0)
MCH: 31.3 pg (ref 26.0–34.0)
MCHC: 34 g/dL (ref 30.0–36.0)
MCV: 92 fL (ref 80.0–100.0)
Platelets: 214 10*3/uL (ref 150–400)
RBC: 3.23 MIL/uL — ABNORMAL LOW (ref 4.22–5.81)
RDW: 12.9 % (ref 11.5–15.5)
WBC: 2.5 10*3/uL — ABNORMAL LOW (ref 4.0–10.5)
nRBC: 0 % (ref 0.0–0.2)

## 2020-08-02 LAB — BASIC METABOLIC PANEL
Anion gap: 4 — ABNORMAL LOW (ref 5–15)
BUN: 38 mg/dL — ABNORMAL HIGH (ref 8–23)
CO2: 24 mmol/L (ref 22–32)
Calcium: 8.2 mg/dL — ABNORMAL LOW (ref 8.9–10.3)
Chloride: 111 mmol/L (ref 98–111)
Creatinine, Ser: 1.48 mg/dL — ABNORMAL HIGH (ref 0.61–1.24)
GFR, Estimated: 52 mL/min — ABNORMAL LOW (ref >60)
Glucose, Bld: 87 mg/dL (ref 70–99)
Potassium: 3.9 mmol/L (ref 3.5–5.1)
Sodium: 139 mmol/L (ref 135–145)

## 2020-08-02 LAB — GLUCOSE, CAPILLARY: Glucose-Capillary: 78 mg/dL (ref 70–99)

## 2020-08-02 LAB — HIV ANTIBODY (ROUTINE TESTING W REFLEX): HIV Screen 4th Generation wRfx: NONREACTIVE

## 2020-08-02 IMAGING — US US RENAL ARTERY DUPLEX
1 series · 14 of 25 positions shown · non-contrast
Comparison: Prior PET-CT [DATE]

CLINICAL DATA: 69-year-old male with hypertension and acute kidney
injury

EXAM:
RENAL/URINARY TRACT ULTRASOUND
RENAL DUPLEX DOPPLER ULTRASOUND

[Series 1: us renal art complete · 14 of 70 slices shown]
[im 1/70]
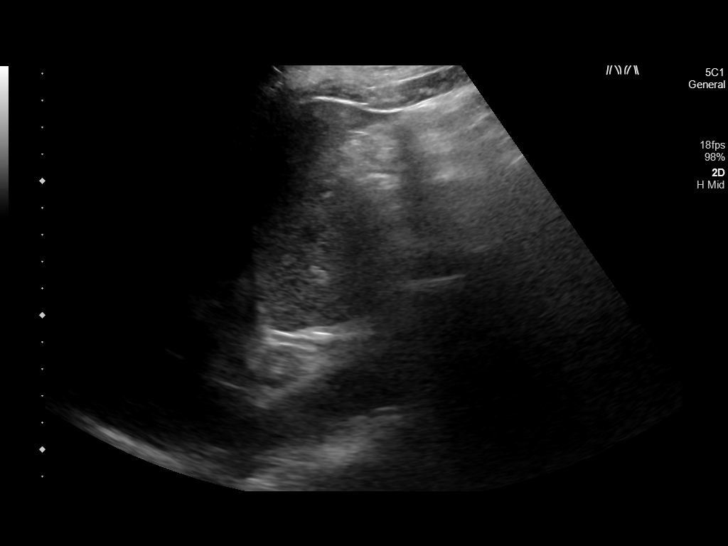
[im 6/70]
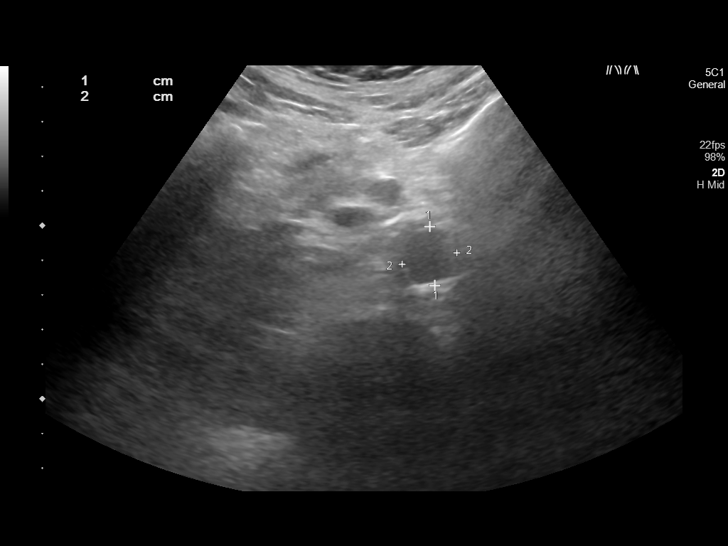
[im 12/70]
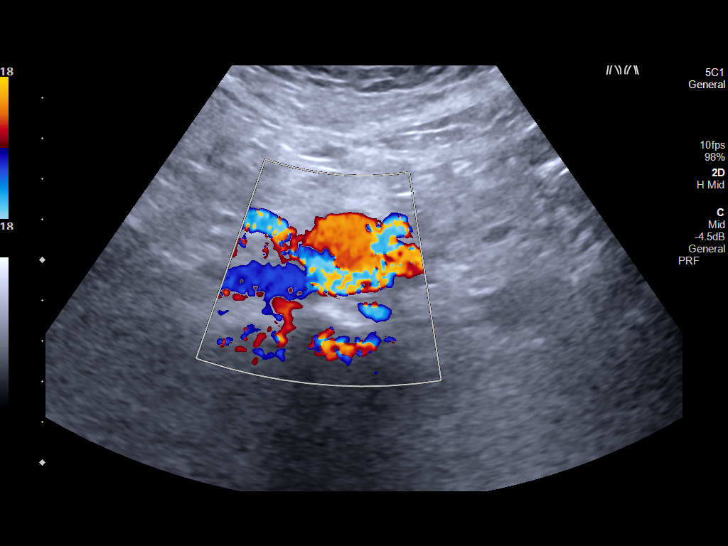
[im 18/70]
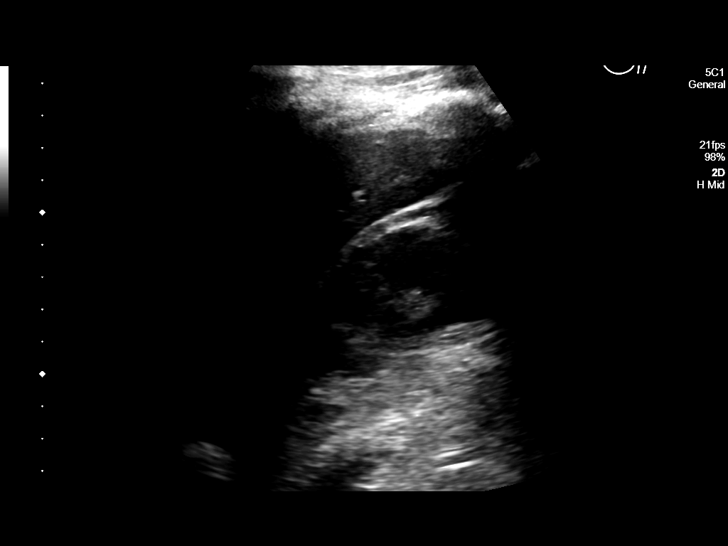
[im 24/70]
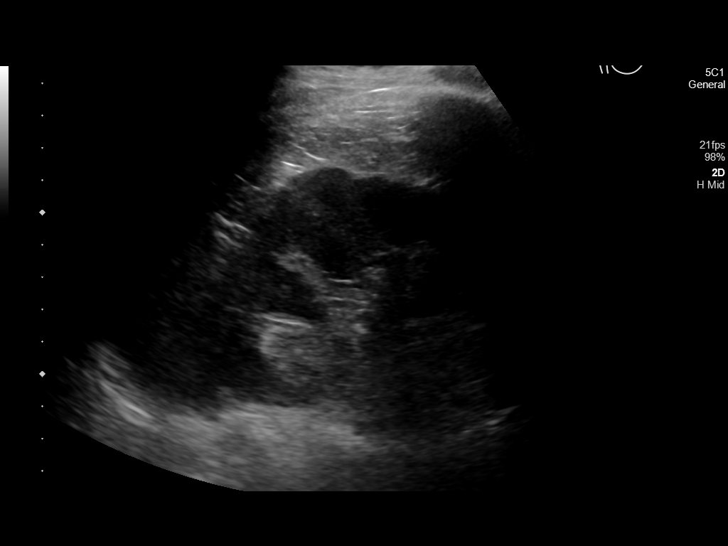
[im 26/70]
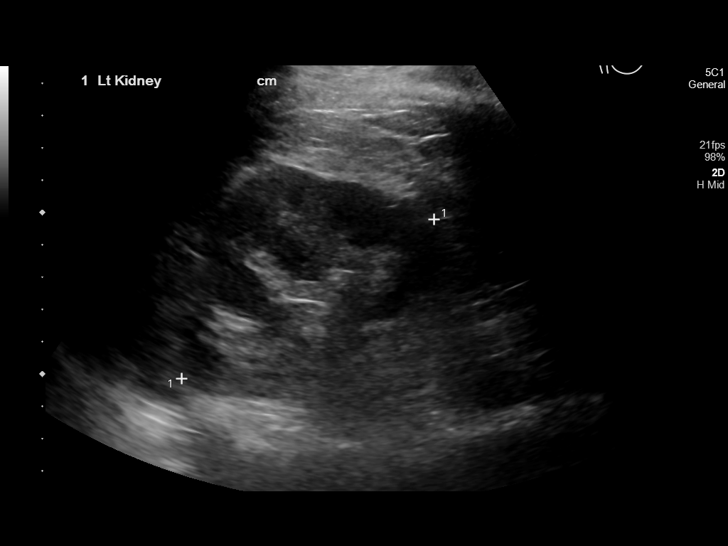
[im 32/70]
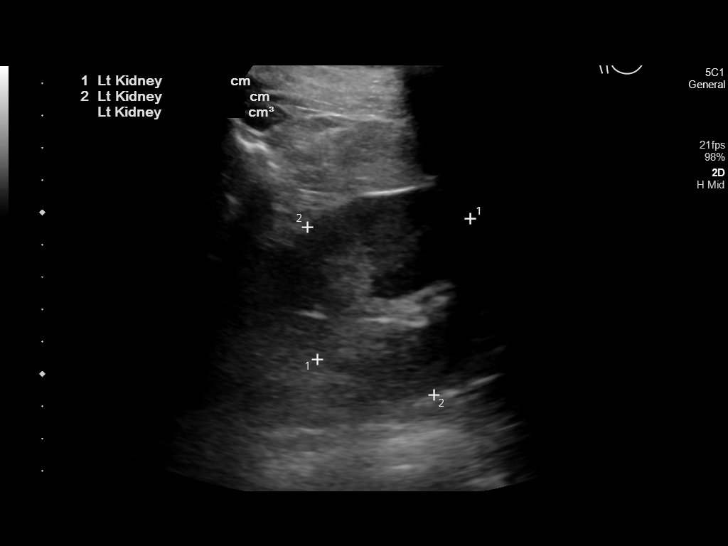
[im 38/70]
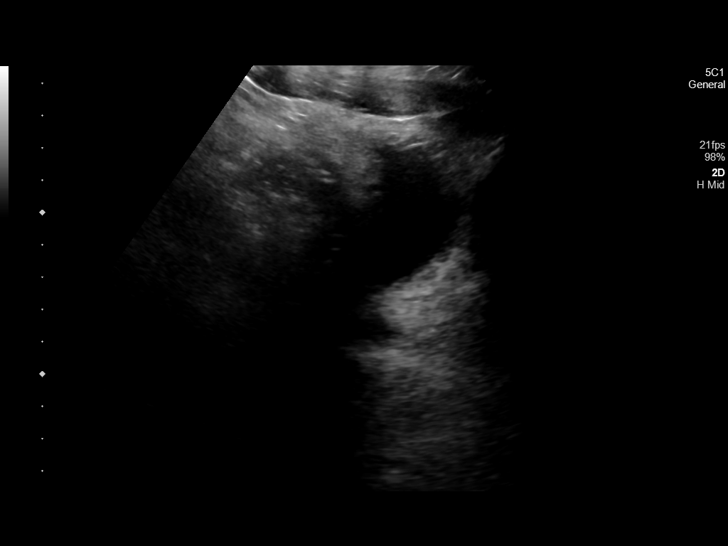
[im 44/70]
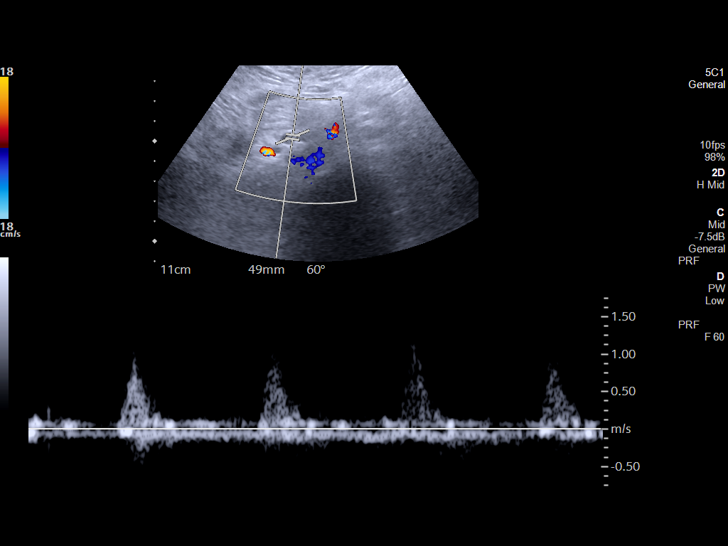
[im 47/70]
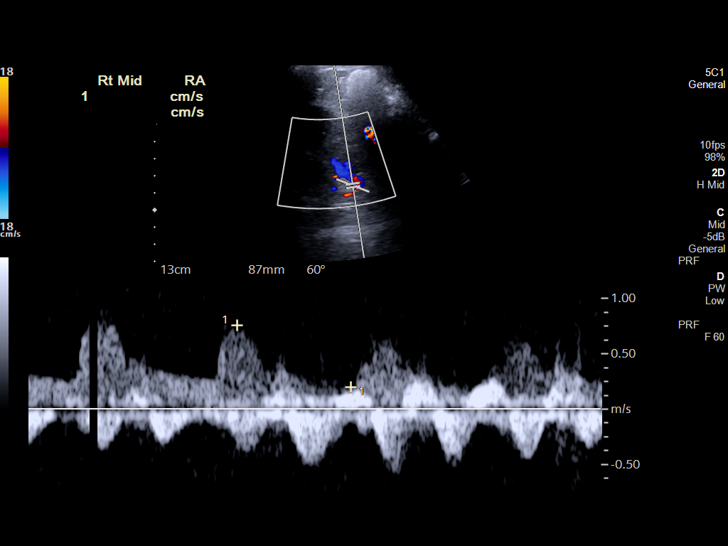
[im 52/70]
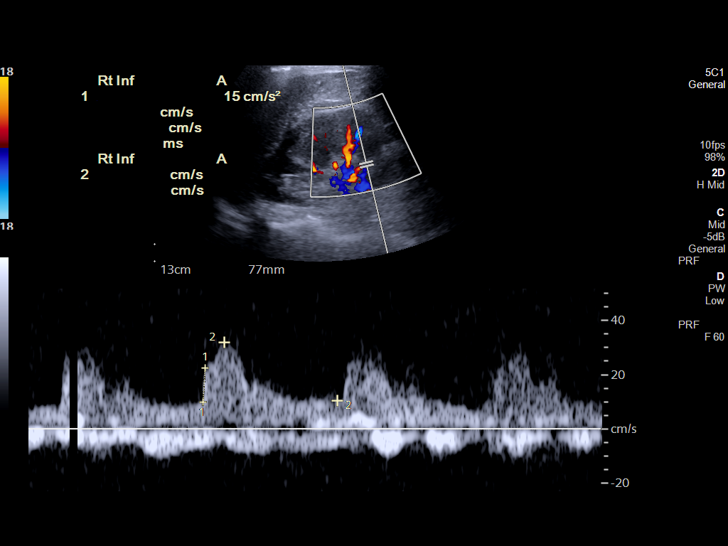
[im 58/70]
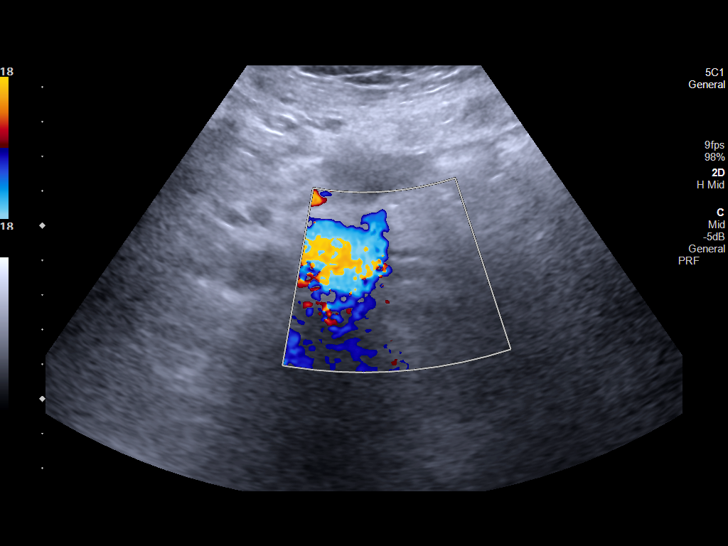
[im 64/70]
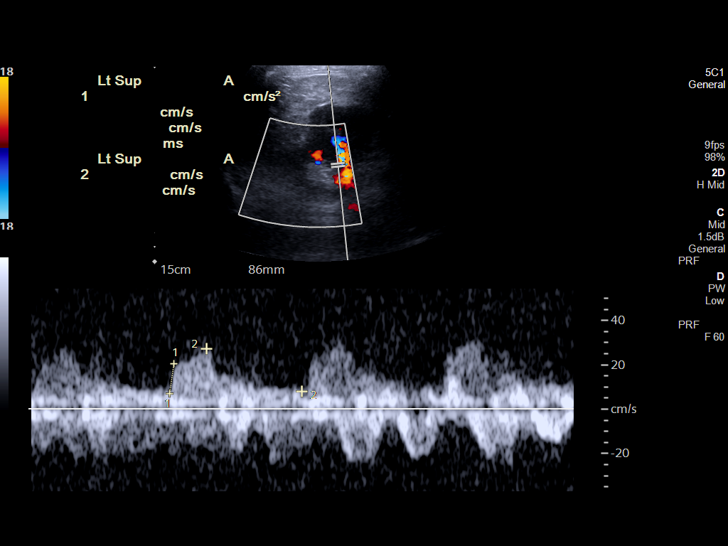
[im 70/70]
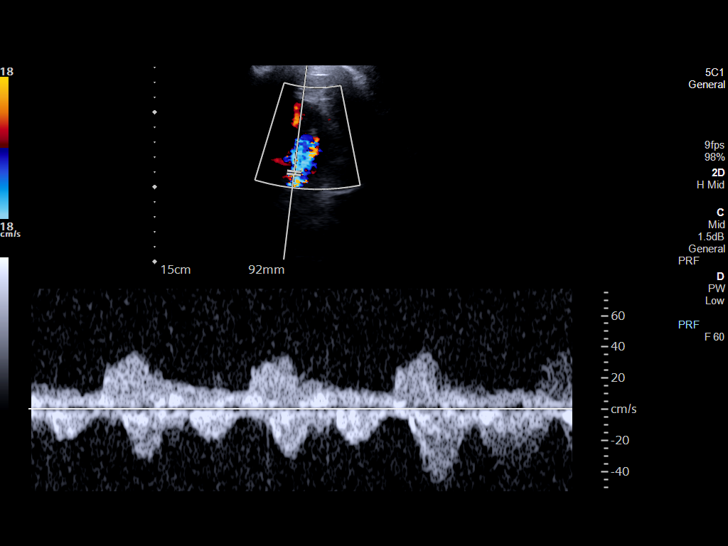

[14 of 25 positions shown; findings below may reference images not displayed]

FINDINGS: Right Kidney:

Length: 8.5 cm. Echogenicity within normal limits. No mass or
hydronephrosis visualized.

Left Kidney:

Length: 9.2 cm. Echogenicity within normal limits. No mass or
hydronephrosis visualized.

Bladder:  Within normal limits for degree of distension.

RENAL DUPLEX ULTRASOUND

Right Renal Artery Velocities:

Origin:  101 cm/sec

Mid:  76 cm/sec

Hilum:  69 cm/sec

Interlobar:  33 cm/sec

Arcuate:  27 cm/sec

Left Renal Artery Velocities:

Origin:  117 cm/sec

Mid:  60 cm/sec

Hilum:  44 cm/sec

Interlobar:  31 cm/sec

Arcuate:  20 cm/sec

Aortic Velocity:  146 cm/sec

Right Renal-Aortic Ratios:

Origin:

Mid:

Hilum:

Interlobar:

Arcuate:

Left Renal-Aortic Ratios:

Origin:

Mid:

Hilum:

Interlobar:

Arcuate:
IMPRESSION: 1. No evidence of hemodynamically significant renal artery stenosis.
2. Normal sonographic appearance of the kidneys.

## 2020-08-02 MED ORDER — TRESIBA FLEXTOUCH 100 UNIT/ML ~~LOC~~ SOPN: PEN_INJECTOR | SUBCUTANEOUS | 3 refills | Status: DC

## 2020-08-02 NOTE — Discharge Summary (Addendum)
Physician Discharge Summary   OSINACHI NAVARRETTE  male DOB: [birth year]  HTD:[id number]  PCP: Birdie Sons, MD  Admit date: 08/01/2020 Discharge date: 08/02/2020  Admitted From: home Disposition:  home CODE STATUS: Full code  Discharge Instructions    Diet - low sodium heart healthy   Complete by: As directed    Discharge instructions   Complete by: As directed    Please make sure you keep well hydrated and avoid using cocaine.  Your blood sugar has been normal without your long-acting insulin Tresiba, so I have reduced it to 10 units daily (down from 30).  Please follow up with your outpatient doctor for further adjustment. - -   Increase activity slowly   Complete by: As directed        Hospital Course:  For full details, please see H&P, progress notes, consult notes and ancillary notes.  Briefly,  LAURIE LOVEJOY is a 70 y.o. male with medical history significant for insulin-dependent diabetes mellitus, hypertension, smoldering multiple myeloma, chronic neutropenia/leukopenia, CKD 3B, anemia of chronic kidney disease, cocaine use, who presented to the emergency department for chief concerns of dizziness and near syncopal episode.  Patient states that the dizziness lasts approximately 1 to 2 minutes, associated with some diaphoresis.  He states that he was out bringing the trash can and from the facility of his work when he experienced this episode.  He states that he never lost consciousness nor did he have an episode of collapse, or seizures.  He endorses profound weakness.  He reports that on 07/20/2020 the same thing happened.  He denies recent changes to medications.  Near syncope - CT of the head without contrast was read as no acute abnormality.  EKG showed 1st degree AV block, no ACS-related changes.  Trop neg.  Orthostatic BP measurement neg for orthostasis. - Labs on presentation showed AKI which improved with normal saline 1 L bolus per EMS and Normal saline 125  mL/h for 1 day  --UDS positive for cocaine and Cannabinoid. --Etiology of dizziness likely due to dehydration in the setting of drug use.  First-degree heart block --trop neg.  Pt denied chest pain.    Acute kidney injury, POA, resolved --Cr 2.65 on presentation, improved to 1.48 the next day with IV hydration.  Insulin-dependent diabetes mellitus, poorly controlled -Last A1c was 06/14/2020: 8.8 BG had been normal during hospitalization without home [name] (not ordered on admission), so 30 units daily as listed on home med may be too much, so pt was discharged on 10 units daily.  Pt is to follow up with outpatient doctor for further adjustment.  Hypertension -continued home amlodipine 10 mg   Hyperlipidemia - continued pravastatin 40 mg  Peripheral neuropathy -continued gabapentin 300 mg p.o. twice daily  Smoldering multiple myeloma Chronic neutropenia  - At baseline - Outpatient follow-up  Active cocaine use UDS positive for cocaine on presentation, and every UDS had been positive for cocaine since available record on 2017.   Discharge Diagnoses:  Principal Problem:   Pre-syncope Active Problems:   Essential (primary) hypertension   AKI (acute kidney injury) (HCC)   CKD (chronic kidney disease) stage 3, GFR 30-59 ml/min (HCC)   Generalized weakness   Anemia of chronic kidney failure, stage 3 (moderate) (HCC)   Heart block AV first degree   Smoldering multiple myeloma ([place])     Discharge Instructions:  Allergies as of 08/02/2020      Reactions   Penicillins Other (See Comments)  Has patient had a PCN reaction causing immediate rash, facial/tongue/throat swelling, SOB or lightheadedness with hypotension: No Has patient had a PCN reaction causing severe rash involving mucus membranes or skin necrosis: No Has patient had a PCN reaction that required hospitalization No Has patient had a PCN reaction occurring within the last 10 years: No If all of the above  answers are "NO", then may proceed with Cephalosporin use.      Medication List    STOP taking these medications   pregabalin 75 MG capsule Commonly known as: Lyrica     TAKE these medications   amLODipine 10 MG tablet Commonly known as: NORVASC Take 1 tablet (10 mg total) by mouth daily.   aspirin 81 MG chewable tablet Chew 1 tablet by mouth daily.   B-D SINGLE USE SWABS REGULAR Pads Use to check blood sugar four times daily for type 1 diabetes   gabapentin 300 MG capsule Commonly known as: NEURONTIN Take 1 capsule by mouth 2 (two) times daily.   insulin aspart 100 UNIT/ML injection Commonly known as: novoLOG Inject up to 14 units three times daily before meals according to sliding scale   INSULIN SYRINGE 1CC/31GX5/16" 31G X 5/16" 1 ML Misc For insulin injections up to 4 times daily   lovastatin 40 MG tablet Commonly known as: MEVACOR Take 1 tablet (40 mg total) by mouth at bedtime.   omeprazole 40 MG capsule Commonly known as: PRILOSEC Take 1 capsule (40 mg total) by mouth 2 (two) times daily before a meal.   [name] FlexTouch 100 UNIT/ML FlexTouch Pen Generic drug: insulin degludec Reduce to 10 units daily (down from 30) due to normal blood sugar in the hospital without this insulin. What changed: additional instructions   True Metrix Level 1 Low Soln Use to check blood sugar four times daily for type 1 diabetes         Allergies  Allergen Reactions  . Penicillins Other (See Comments)    Has patient had a PCN reaction causing immediate rash, facial/tongue/throat swelling, SOB or lightheadedness with hypotension: No Has patient had a PCN reaction causing severe rash involving mucus membranes or skin necrosis: No Has patient had a PCN reaction that required hospitalization No Has patient had a PCN reaction occurring within the last 10 years: No If all of the above answers are "NO", then may proceed with Cephalosporin use.      The results of  significant diagnostics from this hospitalization (including imaging, microbiology, ancillary and laboratory) are listed below for reference.   Consultations:   Procedures/Studies: DG Chest 2 View  Result Date: 08/01/2020 CLINICAL DATA:  Shortness of breath EXAM: CHEST - 2 VIEW COMPARISON:  November 06, 2019; PET-CT May 14, 2020 FINDINGS: Lungs are clear. Heart is upper normal in size with pulmonary vascularity normal. No adenopathy. No bone lesions appreciable. IMPRESSION: Lungs clear.  Heart upper normal in size. Electronically Signed   By: Lowella Grip III M.D.   On: 08/01/2020 14:37   CT Head Wo Contrast  Result Date: 08/01/2020 CLINICAL DATA:  Headache.  Dizziness. EXAM: CT HEAD WITHOUT CONTRAST TECHNIQUE: Contiguous axial images were obtained from the base of the skull through the vertex without intravenous contrast. COMPARISON:  07/29/2019 FINDINGS: Brain: Stable dense dural calcifications. Normal size and position of the ventricles. No intracranial hemorrhage, mass lesion or CT evidence of acute infarction. Vascular: No hyperdense vessel or unexpected calcification. Skull: Normal. Negative for fracture or focal lesion. Sinuses/Orbits: Stable bilateral ethmoid air cell opacification and  retention cyst. Unremarkable orbits. Other: None. IMPRESSION: No acute abnormality. Electronically Signed   By: Claudie Revering M.D.   On: 08/01/2020 15:28   US RENAL ART COMPLETE  Result Date: 08/02/2020 CLINICAL DATA:  70 year old male with hypertension and acute kidney injury EXAM: RENAL/URINARY TRACT ULTRASOUND RENAL DUPLEX DOPPLER ULTRASOUND COMPARISON:  Prior PET-CT 05/14/2020 FINDINGS: Right Kidney: Length: 8.5 cm. Echogenicity within normal limits. No mass or hydronephrosis visualized. Left Kidney: Length: 9.2 cm. Echogenicity within normal limits. No mass or hydronephrosis visualized. Bladder:  Within normal limits for degree of distension. RENAL DUPLEX ULTRASOUND Right Renal Artery Velocities:  Origin:  101 cm/sec Mid:  76 cm/sec Hilum:  69 cm/sec Interlobar:  33 cm/sec Arcuate:  27 cm/sec Left Renal Artery Velocities: Origin:  117 cm/sec Mid:  60 cm/sec Hilum:  44 cm/sec Interlobar:  31 cm/sec Arcuate:  20 cm/sec Aortic Velocity:  146 cm/sec Right Renal-Aortic Ratios: Origin: 0.7 Mid:  0.5 Hilum: 0.5 Interlobar: 0.2 Arcuate: 0.2 Left Renal-Aortic Ratios: Origin: 0.8 Mid: 0.4 Hilum: 0.3 Interlobar: 0.2 Arcuate: 0.1 IMPRESSION: 1. No evidence of hemodynamically significant renal artery stenosis. 2. Normal sonographic appearance of the kidneys. Signed, Criselda Peaches, MD, [facility] Electronically Signed   By: Jacqulynn Cadet M.D.   On: 08/02/2020 09:56      Labs: BNP (last 3 results) No results for input(s): BNP in the last 8760 hours. Basic Metabolic Panel: Recent Labs  Lab 08/01/20 1320 08/01/20 1610 08/02/20 0330  NA 136  --  139  K 4.4  --  3.9  CL 108  --  111  CO2 22  --  24  GLUCOSE 129*  --  87  BUN 48*  --  38*  CREATININE 2.65*  --  1.48*  CALCIUM 8.5*  --  8.2*  MG 1.9  --   --   PHOS  --  4.4  --    Liver Function Tests: Recent Labs  Lab 08/01/20 1320  AST 13*  ALT 11  ALKPHOS 45  BILITOT 0.4  PROT 8.3*  ALBUMIN 3.1*   No results for input(s): LIPASE, AMYLASE in the last 168 hours. No results for input(s): AMMONIA in the last 168 hours. CBC: Recent Labs  Lab 08/01/20 1320 08/02/20 0330  WBC 2.5* 2.5*  NEUTROABS 1.1*  --   HGB 9.7* 10.1*  HCT 29.7* 29.7*  MCV 93.7 92.0  PLT 203 214   Cardiac Enzymes: No results for input(s): CKTOTAL, CKMB, CKMBINDEX, TROPONINI in the last 168 hours. BNP: Invalid input(s): POCBNP CBG: Recent Labs  Lab 08/01/20 1415 08/01/20 2108 08/02/20 0834  GLUCAP 91 211* 78   D-Dimer No results for input(s): DDIMER in the last 72 hours. Hgb A1c No results for input(s): HGBA1C in the last 72 hours. Lipid Profile No results for input(s):  CHOL, HDL, LDLCALC, TRIG, CHOLHDL, LDLDIRECT in the last 72 hours. Thyroid function studies Recent Labs    08/01/20 1610  TSH 0.518   Anemia work up No results for input(s): VITAMINB12, FOLATE, FERRITIN, TIBC, IRON, RETICCTPCT in the last 72 hours. Urinalysis    Component Value Date/Time   COLORURINE YELLOW (A) 08/01/2020 1320   APPEARANCEUR CLEAR (A) 08/01/2020 1320   LABSPEC 1.010 08/01/2020 1320   PHURINE 5.0 08/01/2020 1320   GLUCOSEU >=500 (A) 08/01/2020 1320   HGBUR SMALL (A) 08/01/2020 1320   BILIRUBINUR NEGATIVE 08/01/2020 [place] 08/01/2020 [place] 08/01/2020 1320   NITRITE NEGATIVE  08/01/2020 1320   LEUKOCYTESUR NEGATIVE 08/01/2020 1320   Sepsis Labs Invalid input(s): PROCALCITONIN,  WBC,  LACTICIDVEN Microbiology Recent Results (from the past 240 hour(s))  Resp Panel by RT-PCR (Flu A&B, Covid) Nasopharyngeal Swab     Status: None   Collection Time: 08/01/20  1:31 PM   Specimen: Nasopharyngeal Swab; Nasopharyngeal(NP) swabs in vial transport medium  Result Value Ref Range Status   SARS Coronavirus 2 by RT PCR NEGATIVE NEGATIVE Final    Comment: (NOTE) SARS-CoV-2 target nucleic acids are NOT DETECTED.  The SARS-CoV-2 RNA is generally detectable in upper respiratory specimens during the acute phase of infection. The lowest concentration of SARS-CoV-2 viral copies this assay can detect is 138 copies/mL. A negative result does not preclude SARS-Cov-2 infection and should not be used as the sole basis for treatment or other patient management decisions. A negative result may occur with  improper specimen collection/handling, submission of specimen other than nasopharyngeal swab, presence of viral mutation(s) within the areas targeted by this assay, and inadequate number of viral copies(<138 copies/mL). A negative result must be combined with clinical observations, patient history, and epidemiological information. The expected  result is Negative.  Fact Sheet for Patients:  EntrepreneurPulse.com.au  Fact Sheet for Healthcare Providers:  IncredibleEmployment.be  This test is no t yet approved or cleared by the [place] FDA and  has been authorized for detection and/or diagnosis of SARS-CoV-2 by FDA under an Emergency Use Authorization (EUA). This EUA will remain  in effect (meaning this test can be used) for the duration of the COVID-19 declaration under Section 564(b)(1) of the Act, 21 U.S.C.section 360bbb-3(b)(1), unless the authorization is terminated  or revoked sooner.       Influenza A by PCR NEGATIVE NEGATIVE Final   Influenza B by PCR NEGATIVE NEGATIVE Final    Comment: (NOTE) The Xpert Xpress SARS-CoV-2/FLU/RSV plus assay is intended as an aid in the diagnosis of influenza from Nasopharyngeal swab specimens and should not be used as a sole basis for treatment. Nasal washings and aspirates are unacceptable for Xpert Xpress SARS-CoV-2/FLU/RSV testing.  Fact Sheet for Patients: EntrepreneurPulse.com.au  Fact Sheet for Healthcare Providers: IncredibleEmployment.be  This test is not yet approved or cleared by the [place] FDA and has been authorized for detection and/or diagnosis of SARS-CoV-2 by FDA under an Emergency Use Authorization (EUA). This EUA will remain in effect (meaning this test can be used) for the duration of the COVID-19 declaration under Section 564(b)(1) of the Act, 21 U.S.C. section 360bbb-3(b)(1), unless the authorization is terminated or revoked.  Performed at [facility], [address] [place], [place] [address]      Total time spend on discharging this patient, including the last patient exam, discussing the hospital stay, instructions for ongoing care as it relates to all pertinent caregivers, as well as preparing the medical discharge records, prescriptions, and/or  referrals as applicable, is 30 minutes.    Enzo Bi, MD  [facility] 08/02/2020, 11:19 AM

## 2020-08-02 NOTE — Plan of Care (Signed)
  Problem: Education: Goal: Knowledge of General Education information will improve Description: Including pain rating scale, medication(s)/side effects and non-pharmacologic comfort measures Outcome: Adequate for Discharge   Problem: Health Behavior/Discharge Planning: Goal: Ability to manage health-related needs will improve Outcome: Adequate for Discharge   Problem: Clinical Measurements: Goal: Ability to maintain clinical measurements within normal limits will improve Outcome: Adequate for Discharge Goal: Will remain free from infection Outcome: Adequate for Discharge Goal: Diagnostic test results will improve Outcome: Adequate for Discharge Goal: Respiratory complications will improve Outcome: Adequate for Discharge Goal: Cardiovascular complication will be avoided Outcome: Adequate for Discharge   Problem: Nutrition: Goal: Adequate nutrition will be maintained Outcome: Adequate for Discharge   Problem: Activity: Goal: Risk for activity intolerance will decrease Outcome: Adequate for Discharge   Problem: Coping: Goal: Level of anxiety will decrease Outcome: Adequate for Discharge

## 2020-08-02 NOTE — Progress Notes (Signed)
Pt off floor to ordered US

## 2020-08-02 NOTE — Progress Notes (Signed)
Pt ambulated independently off the floor for discharge, refused transport.  Pt wearing his own clothes and has verbalized understanding of all discharge instructions provided.

## 2020-08-02 NOTE — Chronic Care Management (AMB) (Signed)
  Chronic Care Management   Outreach Note  08/02/2020 Name: Nicholas Weiss MRN: [id number] DOB: Dec 04, 1950  Nicholas Weiss is a 70 y.o. year old male who is a primary care patient of Caryn Section, Kirstie Peri, MD. I reached out to Nicholas Weiss by phone today in response to a referral sent by Nicholas Weiss PCP, Dr. Caryn Section.     An unsuccessful telephone outreach was attempted today. The patient was referred to the case management team for assistance with care management and care coordination.   Follow Up Plan: The care management team will reach out to the patient again over the next 7 days.  If patient returns call to provider office, please advise to call [facility] at [phone].  [place] Management

## 2020-08-06 ENCOUNTER — Other Ambulatory Visit: Payer: Self-pay

## 2020-08-06 ENCOUNTER — Inpatient Hospital Stay (HOSPITAL_BASED_OUTPATIENT_CLINIC_OR_DEPARTMENT_OTHER): Payer: Medicare HMO | Admitting: Oncology

## 2020-08-06 ENCOUNTER — Other Ambulatory Visit: Payer: Self-pay | Admitting: *Deleted

## 2020-08-06 ENCOUNTER — Encounter: Payer: Self-pay | Admitting: Oncology

## 2020-08-06 ENCOUNTER — Inpatient Hospital Stay: Payer: Medicare HMO | Attending: Oncology

## 2020-08-06 ENCOUNTER — Inpatient Hospital Stay: Payer: Medicare HMO

## 2020-08-06 VITALS — BP 140/87 | HR 76 | Temp 95.3°F | Resp 18 | Wt 178.0 lb

## 2020-08-06 DIAGNOSIS — D631 Anemia in chronic kidney disease: Secondary | ICD-10-CM | POA: Diagnosis not present

## 2020-08-06 DIAGNOSIS — C9000 Multiple myeloma not having achieved remission: Secondary | ICD-10-CM

## 2020-08-06 DIAGNOSIS — N183 Chronic kidney disease, stage 3 unspecified: Secondary | ICD-10-CM

## 2020-08-06 DIAGNOSIS — I959 Hypotension, unspecified: Secondary | ICD-10-CM

## 2020-08-06 DIAGNOSIS — E1065 Type 1 diabetes mellitus with hyperglycemia: Secondary | ICD-10-CM | POA: Diagnosis not present

## 2020-08-06 DIAGNOSIS — D472 Monoclonal gammopathy: Secondary | ICD-10-CM

## 2020-08-06 DIAGNOSIS — Z79899 Other long term (current) drug therapy: Secondary | ICD-10-CM | POA: Diagnosis not present

## 2020-08-06 DIAGNOSIS — D649 Anemia, unspecified: Secondary | ICD-10-CM | POA: Diagnosis not present

## 2020-08-06 DIAGNOSIS — D708 Other neutropenia: Secondary | ICD-10-CM | POA: Diagnosis not present

## 2020-08-06 DIAGNOSIS — E1022 Type 1 diabetes mellitus with diabetic chronic kidney disease: Secondary | ICD-10-CM | POA: Diagnosis not present

## 2020-08-06 DIAGNOSIS — Z7982 Long term (current) use of aspirin: Secondary | ICD-10-CM | POA: Diagnosis not present

## 2020-08-06 LAB — COMPREHENSIVE METABOLIC PANEL
ALT: 18 U/L (ref 0–44)
AST: 20 U/L (ref 15–41)
Albumin: 3.1 g/dL — ABNORMAL LOW (ref 3.5–5.0)
Alkaline Phosphatase: 49 U/L (ref 38–126)
Anion gap: 7 (ref 5–15)
BUN: 40 mg/dL — ABNORMAL HIGH (ref 8–23)
CO2: 25 mmol/L (ref 22–32)
Calcium: 8.1 mg/dL — ABNORMAL LOW (ref 8.9–10.3)
Chloride: 103 mmol/L (ref 98–111)
Creatinine, Ser: 1.83 mg/dL — ABNORMAL HIGH (ref 0.61–1.24)
GFR, Estimated: 40 mL/min — ABNORMAL LOW (ref >60)
Glucose, Bld: 204 mg/dL — ABNORMAL HIGH (ref 70–99)
Potassium: 4.3 mmol/L (ref 3.5–5.1)
Sodium: 135 mmol/L (ref 135–145)
Total Bilirubin: 0.4 mg/dL (ref 0.3–1.2)
Total Protein: 8 g/dL (ref 6.5–8.1)

## 2020-08-06 LAB — CBC WITH DIFFERENTIAL/PLATELET
Abs Immature Granulocytes: 0 10*3/uL (ref 0.00–0.07)
Basophils Absolute: 0 10*3/uL (ref 0.0–0.1)
Basophils Relative: 1 %
Eosinophils Absolute: 0 10*3/uL (ref 0.0–0.5)
Eosinophils Relative: 2 %
HCT: 30 % — ABNORMAL LOW (ref 39.0–52.0)
Hemoglobin: 10 g/dL — ABNORMAL LOW (ref 13.0–17.0)
Immature Granulocytes: 0 %
Lymphocytes Relative: 35 %
Lymphs Abs: 0.7 10*3/uL (ref 0.7–4.0)
MCH: 31.1 pg (ref 26.0–34.0)
MCHC: 33.3 g/dL (ref 30.0–36.0)
MCV: 93.2 fL (ref 80.0–100.0)
Monocytes Absolute: 0.3 10*3/uL (ref 0.1–1.0)
Monocytes Relative: 18 %
Neutro Abs: 0.9 10*3/uL — ABNORMAL LOW (ref 1.7–7.7)
Neutrophils Relative %: 44 %
Platelets: 143 10*3/uL — ABNORMAL LOW (ref 150–400)
RBC: 3.22 MIL/uL — ABNORMAL LOW (ref 4.22–5.81)
RDW: 13 % (ref 11.5–15.5)
WBC: 1.9 10*3/uL — ABNORMAL LOW (ref 4.0–10.5)
nRBC: 0 % (ref 0.0–0.2)

## 2020-08-06 MED ORDER — EPOETIN ALFA-EPBX 40000 UNIT/ML IJ SOLN
40000.0000 [IU] | Freq: Once | INTRAMUSCULAR | Status: AC
  Administered 2020-08-06: 40000 [IU] via SUBCUTANEOUS
  Filled 2020-08-06: qty 1

## 2020-08-06 NOTE — Progress Notes (Signed)
Pt in for follow up, reports has been seeing double for a week. States seen in ED last week for dehydration and low blood pressure.  Reports feeling very weak and pain in right hand today.

## 2020-08-07 LAB — KAPPA/LAMBDA LIGHT CHAINS
Kappa free light chain: 13.5 mg/L (ref 3.3–19.4)
Kappa, lambda light chain ratio: 0.04 — ABNORMAL LOW (ref 0.26–1.65)
Lambda free light chains: 337.3 mg/L — ABNORMAL HIGH (ref 5.7–26.3)

## 2020-08-07 NOTE — Chronic Care Management (AMB) (Signed)
  Chronic Care Management   Note  08/07/2020 Name: JONTEZ REDFIELD MRN: [id number] DOB: 1950-07-26  BAIRON KLEMANN is a 70 y.o. year old male who is a primary care patient of Caryn Section, Kirstie Peri, MD. I reached out to Halina Maidens by phone today in response to a referral sent by Mr. Manon Hilding Markov's PCP, Dr. Caryn Section.     Mr. Parekh was given information about Chronic Care Management services today including:  1. CCM service includes personalized support from designated clinical staff supervised by his physician, including individualized plan of care and coordination with other care providers 2. 24/7 contact phone numbers for assistance for urgent and routine care needs. 3. Service will only be billed when office clinical staff spend 20 minutes or more in a month to coordinate care. 4. Only one practitioner may furnish and bill the service in a calendar month. 5. The patient may stop CCM services at any time (effective at the end of the month) by phone call to the office staff. 6. The patient will be responsible for cost sharing (co-pay) of up to 20% of the service fee (after annual deductible is met).  Patient agreed to services and verbal consent obtained.   Follow up plan: Telephone appointment with care management team member scheduled for: 08/12/2020  [place] Management

## 2020-08-10 LAB — METHYLMALONIC ACID, SERUM: Methylmalonic Acid, Quantitative: 161 nmol/L (ref 0–378)

## 2020-08-10 NOTE — Progress Notes (Signed)
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 Patient's creatinine has been fluctuating between 1.5-2 but about 5 months ago it went up all the way to 5 and his blood sugars were in the 1000 range. More recently his kidney numbers have been drifting back to normal values. As a part of the work-up he had serum protein electrophoresis done which showed an elevated gammaglobulin fraction with an M spike of 3%. The amount of M spike was not quantified in the specimen. He has therefore been referred to [place] for further management.Patient endorses chronic fatigue reports that his appetite and weight have remained stable  Results of blood work from 04/08/2020 were as follows: CBC showed white count of 2.9, H&H of 10.5/31.2 with an MCV of 91 and a platelet count of 191. CMP showed a mildly elevated creatinine of 1.2 which was better as compared to 5 months ago when it was 3.9. Total protein was mildly elevated at 9.1 calcium normal at 8.4 ferritin and iron studies B12 folate TSH and haptoglobin were normal. Myeloma panel revealed an elevated IgG level of 06/04/2004 with an M protein of 3.1 g. Immunofixation showed IgG lambda specificity. Serum free light chain  ratio was elevated at 25 and free light chain lambda elevated at 370  Further myeloma work-up including a PET CT scan did not reveal any evidence of edematous lesions.  Bone marrow biopsy showed 23% plasma cells by manual count and 30% by CD138 IHC.  Normal cytogenetics.  FISH studies for myeloma showed gain of 1 q.  13 q-. detected.  P53 not detected.  Interval history-patient was recently admitted to the hospital for symptoms of syncope and AKI.  He was also found to have first-degree AV block and was supposed to follow-up with cardiology.  He does not have an appointment with them.  He continues to use cocaine on and off.  ECOG PS- 1 Pain scale- 0   Review of systems- Review of Systems  Constitutional: Positive for malaise/fatigue. Negative for chills, fever and weight loss.  HENT: Negative for congestion, ear discharge and nosebleeds.   Eyes: Negative for blurred vision.  Respiratory: Negative for cough, hemoptysis, sputum production, shortness of breath and wheezing.   Cardiovascular: Positive for palpitations. Negative for chest pain, orthopnea and claudication.  Gastrointestinal: Negative for abdominal pain, blood in stool, constipation, diarrhea, heartburn, melena, nausea and vomiting.  Genitourinary: Negative for dysuria, flank pain, frequency, hematuria and urgency.  Musculoskeletal: Negative for back pain, joint pain and myalgias.  Skin: Negative for rash.  Neurological: Negative for dizziness, tingling, focal weakness, seizures, weakness and headaches.  Endo/Heme/Allergies: Does not bruise/bleed easily.  Psychiatric/Behavioral: Negative for depression and suicidal ideas. The patient does not have insomnia.      Allergies  Allergen Reactions  . Penicillins Other (See Comments)    Has patient had  a PCN reaction causing immediate rash, facial/tongue/throat swelling, SOB or lightheadedness with hypotension: No Has patient had a PCN reaction causing severe rash involving mucus  membranes or skin necrosis: No Has patient had a PCN reaction that required hospitalization No Has patient had a PCN reaction occurring within the last 10 years: No If all of the above answers are "NO", then may proceed with Cephalosporin use.      Past Medical History:  Diagnosis Date  . DKA (diabetic ketoacidoses) 04/06/2016  . Hypercholesteremia   . Hypertension      Past Surgical History:  Procedure Laterality Date  . COLONOSCOPY WITH PROPOFOL N/A 02/01/2020   Procedure: COLONOSCOPY WITH PROPOFOL;  Surgeon: Lin Landsman, MD;  Location: [facility] ENDOSCOPY;  Service: Gastroenterology;  Laterality: N/A;  . ESOPHAGOGASTRODUODENOSCOPY  02/01/2020   Procedure: ESOPHAGOGASTRODUODENOSCOPY (EGD);  Surgeon: Lin Landsman, MD;  Location: [facility] ENDOSCOPY;  Service: Gastroenterology;;  . KNEE SURGERY Right    Torn meniscus  . KNEE SURGERY Left     Social History   Socioeconomic History  . Marital status: Single    Spouse name: Not on file  . Number of children: 3  . Years of education: Not on file  . 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 Smoking status: Never Smoker  . Smokeless tobacco: Never Used  Vaping Use  . Vaping Use: Never used  Substance and Sexual Activity  . Alcohol use: Yes    Alcohol/week: 0.0 - 1.0 standard drinks    Comment: "once every 2 months"  . Drug use: Not Currently    Types: Marijuana    Comment: "none in a long time'  . Sexual activity: Yes    Birth control/protection: None  Other Topics Concern  . Not on file  Social History Narrative  . Not on file   Social Determinants of Health   Financial Resource Strain: Low Risk   . Difficulty of Paying Living Expenses: Not hard at all  Food Insecurity: No Food Insecurity  . Worried About Charity fundraiser in the Last Year: Never true  . Ran Out of Food in the Last Year: Never true  Transportation Needs: No  Transportation Needs  . Lack of Transportation (Medical): No  . Lack of Transportation (Non-Medical): No  Physical Activity: Insufficiently Active  . Days of Exercise per Week: 7 days  . Minutes of Exercise per Session: 20 min  Stress: No Stress Concern Present  . Feeling of Stress : Not at all  Social Connections: Moderately Isolated  . Frequency of Communication with Friends and Family: More than three times a week  . Frequency of Social Gatherings with Friends and Family: More than three times a week  . Attends Religious Services: Never  . Active Member of Clubs or Organizations: No  . Attends Archivist Meetings: Never  . Marital Status: Living with partner  Intimate Partner Violence: Not At Risk  . Fear of Current or Ex-Partner: No  . Emotionally Abused: No  . Physically Abused: No  . Sexually Abused: No    Family History  Problem Relation Age of Onset  . Heart attack Father   . Hypertension Sister   . Cancer Sister      Current Outpatient Medications:  .  Alcohol Swabs (B-D SINGLE USE SWABS REGULAR) PADS, Use to check blood sugar four times daily for type 1 diabetes, Disp: 300 each, Rfl:  4 .  amLODipine (NORVASC) 10 MG tablet, Take 1 tablet (10 mg total) by mouth daily., Disp: 90 tablet, Rfl: 4 .  aspirin 81 MG chewable tablet, Chew 1 tablet by mouth daily., Disp: , Rfl:  .  Blood Glucose Calibration (TRUE METRIX LEVEL 1) Low SOLN, Use to check blood sugar four times daily for type 1 diabetes, Disp: 1 each, Rfl: 2 .  gabapentin (NEURONTIN) 300 MG capsule, Take 1 capsule by mouth 2 (two) times daily., Disp: , Rfl:  .  insulin aspart (NOVOLOG) 100 UNIT/ML injection, Inject up to 14 units three times daily before meals according to sliding scale, Disp: 30 mL, Rfl: 4 .  insulin degludec (TRESIBA FLEXTOUCH) 100 UNIT/ML FlexTouch Pen, Reduce to 10 units daily (down from 30) due to normal blood sugar in the hospital without this insulin., Disp: 30 mL, Rfl: 3 .  Insulin  Syringe-Needle U-100 (INSULIN SYRINGE 1CC/31GX5/16") 31G X 5/16" 1 ML MISC, For insulin injections up to 4 times daily, Disp: 270 each, Rfl: 4 .  lovastatin (MEVACOR) 40 MG tablet, Take 1 tablet (40 mg total) by mouth at bedtime., Disp: 90 tablet, Rfl: 4 .  omeprazole (PRILOSEC) 40 MG capsule, Take 1 capsule (40 mg total) by mouth 2 (two) times daily before a meal. (Patient not taking: Reported on 08/06/2020), Disp: 60 capsule, Rfl: 2  Physical exam:  Vitals:   08/06/20 1433  BP: 140/87  Pulse: 76  Resp: 18  Temp: (!) 95.3 F (35.2 C)  TempSrc: Tympanic  SpO2: 100%  Weight: 178 lb (80.7 kg)   Physical Exam Constitutional:      General: He is not in acute distress. Cardiovascular:     Rate and Rhythm: Normal rate and regular rhythm.     Heart sounds: Normal heart sounds.  Pulmonary:     Effort: Pulmonary effort is normal.     Breath sounds: Normal breath sounds.  Abdominal:     General: Bowel sounds are normal.     Palpations: Abdomen is soft.  Skin:    General: Skin is warm and dry.  Neurological:     Mental Status: He is alert and oriented to person, place, and time.      CMP Latest Ref Rng & Units 08/06/2020  Glucose 70 - 99 mg/dL 204(H)  BUN 8 - 23 mg/dL 40(H)  Creatinine 0.61 - 1.24 mg/dL 1.83(H)  Sodium 135 - 145 mmol/L 135  Potassium 3.5 - 5.1 mmol/L 4.3  Chloride 98 - 111 mmol/L 103  CO2 22 - 32 mmol/L 25  Calcium 8.9 - 10.3 mg/dL 8.1(L)  Total Protein 6.5 - 8.1 g/dL 8.0  Total Bilirubin 0.3 - 1.2 mg/dL 0.4  Alkaline Phos 38 - 126 U/L 49  AST 15 - 41 U/L 20  ALT 0 - 44 U/L 18   CBC Latest Ref Rng & Units 08/06/2020  WBC 4.0 - 10.5 K/uL 1.9(L)  Hemoglobin 13.0 - 17.0 g/dL 10.0(L)  Hematocrit 39.0 - 52.0 % 30.0(L)  Platelets 150 - 400 K/uL 143(L)    No images are attached to the encounter.  DG Chest 2 View  Result Date: 08/01/2020 CLINICAL DATA:  Shortness of breath EXAM: CHEST - 2 VIEW COMPARISON:  November 06, 2019; PET-CT May 14, 2020 FINDINGS: Lungs  are clear. Heart is upper normal in size with pulmonary vascularity normal. No adenopathy. No bone lesions appreciable. IMPRESSION: Lungs clear.  Heart upper normal in size. Electronically Signed   By: Lowella Grip III M.D.   On:  08/01/2020 14:37   CT Head Wo Contrast  Result Date: 08/01/2020 CLINICAL DATA:  Headache.  Dizziness. EXAM: CT HEAD WITHOUT CONTRAST TECHNIQUE: Contiguous axial images were obtained from the base of the skull through the vertex without intravenous contrast. COMPARISON:  07/29/2019 FINDINGS: Brain: Stable dense dural calcifications. Normal size and position of the ventricles. No intracranial hemorrhage, mass lesion or CT evidence of acute infarction. Vascular: No hyperdense vessel or unexpected calcification. Skull: Normal. Negative for fracture or focal lesion. Sinuses/Orbits: Stable bilateral ethmoid air cell opacification and retention cyst. Unremarkable orbits. Other: None. IMPRESSION: No acute abnormality. Electronically Signed   By: Claudie Revering M.D.   On: 08/01/2020 15:28   US RENAL ART COMPLETE  Result Date: 08/02/2020 CLINICAL DATA:  70 year old male with hypertension and acute kidney injury EXAM: RENAL/URINARY TRACT ULTRASOUND RENAL DUPLEX DOPPLER ULTRASOUND COMPARISON:  Prior PET-CT 05/14/2020 FINDINGS: Right Kidney: Length: 8.5 cm. Echogenicity within normal limits. No mass or hydronephrosis visualized. Left Kidney: Length: 9.2 cm. Echogenicity within normal limits. No mass or hydronephrosis visualized. Bladder:  Within normal limits for degree of distension. RENAL DUPLEX ULTRASOUND Right Renal Artery Velocities: Origin:  101 cm/sec Mid:  76 cm/sec Hilum:  69 cm/sec Interlobar:  33 cm/sec Arcuate:  27 cm/sec Left Renal Artery Velocities: Origin:  117 cm/sec Mid:  60 cm/sec Hilum:  44 cm/sec Interlobar:  31 cm/sec Arcuate:  20 cm/sec Aortic Velocity:  146 cm/sec Right Renal-Aortic Ratios: Origin: 0.7 Mid:  0.5 Hilum: 0.5 Interlobar: 0.2 Arcuate: 0.2 Left Renal-Aortic  Ratios: Origin: 0.8 Mid: 0.4 Hilum: 0.3 Interlobar: 0.2 Arcuate: 0.1 IMPRESSION: 1. No evidence of hemodynamically significant renal artery stenosis. 2. Normal sonographic appearance of the kidneys. Signed, Criselda Peaches, MD, [facility] Electronically Signed   By: Jacqulynn Cadet M.D.   On: 08/02/2020 09:56     Assessment and plan- Patient is a 69 y.o. male with history of smoldering multiple myeloma here for for routine follow-up  Smoldering multiple myeloma:H&H has presently remained stable between 9.5-10.5 over the last year and a half.  Myeloma panel is currently pending and serum free light chain ratio. Has been overall stable at 25.  His kidney functions fluctuate due to his uncontrolled diabetes and possibly cocaine use but presently stable.  Based on his labs today there does not appear to be an overt concern for progression to multiple myeloma.    Anemia of chronic kidney disease: He has been getting Retacrit to keep his hemoglobin between 10-11 for anemia of chronic kidney disease which he will continue.   Neutropenia: His white count is presently lower than before at 1.9.  He has had intermittently low white cell counts but has never been this low.  Neutrophil count is 0.9.  I suspect his neutropenia secondary to cocaine use.  Patient was also found to have first-degree AV block on his hospitalization which typically does not require any intervention but given his ongoing symptoms of palpitations I will refer him to cardiology as well  CBC with differential in 6 weeks and 12 weeks and I will see him in 12 weeks.  Also check myeloma panel and serum free light chain in 12 weeks   Visit Diagnosis 1. Smoldering multiple myeloma ([place])   2. Anemia of chronic kidney failure, stage 3 (moderate) (HCC)   3. Other neutropenia (HCC)   4. Erythropoietin (EPO) stimulating agent anemia management patient      Dr. Randa Evens, MD, MPH [place] [facility]  [facility] [phone] 08/10/2020 10:31 AM

## 2020-08-12 ENCOUNTER — Telehealth: Payer: Medicare HMO

## 2020-08-12 ENCOUNTER — Telehealth: Payer: Self-pay

## 2020-08-12 LAB — MULTIPLE MYELOMA PANEL, SERUM
Albumin SerPl Elph-Mcnc: 3.4 g/dL (ref 2.9–4.4)
Albumin/Glob SerPl: 0.8 (ref 0.7–1.7)
Alpha 1: 0.1 g/dL (ref 0.0–0.4)
Alpha2 Glob SerPl Elph-Mcnc: 0.5 g/dL (ref 0.4–1.0)
B-Globulin SerPl Elph-Mcnc: 0.6 g/dL — ABNORMAL LOW (ref 0.7–1.3)
Gamma Glob SerPl Elph-Mcnc: 3.4 g/dL — ABNORMAL HIGH (ref 0.4–1.8)
Globulin, Total: 4.6 g/dL — ABNORMAL HIGH (ref 2.2–3.9)
IgA: 22 mg/dL — ABNORMAL LOW (ref 61–437)
IgG (Immunoglobin G), Serum: 4096 mg/dL — ABNORMAL HIGH (ref 603–1613)
IgM (Immunoglobulin M), Srm: <5 mg/dL — ABNORMAL LOW (ref 20–172)
M Protein SerPl Elph-Mcnc: 3 g/dL — ABNORMAL HIGH
Total Protein ELP: 8 g/dL (ref 6.0–8.5)

## 2020-08-12 NOTE — Telephone Encounter (Signed)
  Care Management   Follow Up Note   08/12/2020 Name: Nicholas Weiss MRN: [id number] DOB: 04-16-1950   Primary Care Provider: Birdie Sons, MD Reason for referral : Chronic Care Management   An unsuccessful telephone outreach was attempted today. Mr. Kuenzi was referred to the care management team for assistance with care management and care coordination.   Unable to leave a voice message due to voice mailbox not being set up.    Follow Up Plan:  Will notify the Care Guide team to assist with rescheduling.    [facility]y [facility] Care Management [facility] [phone]

## 2020-08-20 ENCOUNTER — Ambulatory Visit: Payer: Self-pay

## 2020-08-20 ENCOUNTER — Telehealth: Payer: Medicare HMO

## 2020-08-20 NOTE — Chronic Care Management (AMB) (Signed)
  Care Management   Follow Up Note   08/20/2020 Name: Nicholas Weiss MRN: [id number] DOB: 1950/07/06   Primary Care Provider: Birdie Sons, MD Reason for referral : Chronic Care Management   An unsuccessful telephone outreach was attempted today. The patient was referred to the case management team for assistance with care management and care coordination.     Follow Up Plan:  Unable to leave voice message due to voice mailbox not being set up. Will notify the Care Guide team of need to reschedule.    [facility] Care Management [facility] [phone]

## 2020-08-27 ENCOUNTER — Inpatient Hospital Stay: Payer: Medicare HMO

## 2020-08-27 ENCOUNTER — Other Ambulatory Visit: Payer: Self-pay

## 2020-08-27 VITALS — BP 128/82 | HR 91

## 2020-08-27 DIAGNOSIS — N183 Chronic kidney disease, stage 3 unspecified: Secondary | ICD-10-CM | POA: Diagnosis not present

## 2020-08-27 DIAGNOSIS — Z79899 Other long term (current) drug therapy: Secondary | ICD-10-CM | POA: Diagnosis not present

## 2020-08-27 DIAGNOSIS — D631 Anemia in chronic kidney disease: Secondary | ICD-10-CM | POA: Diagnosis not present

## 2020-08-27 DIAGNOSIS — C9000 Multiple myeloma not having achieved remission: Secondary | ICD-10-CM | POA: Diagnosis not present

## 2020-08-27 DIAGNOSIS — Z7982 Long term (current) use of aspirin: Secondary | ICD-10-CM | POA: Diagnosis not present

## 2020-08-27 DIAGNOSIS — D472 Monoclonal gammopathy: Secondary | ICD-10-CM

## 2020-08-27 LAB — HEMOGLOBIN AND HEMATOCRIT, BLOOD
HCT: 32.8 % — ABNORMAL LOW (ref 39.0–52.0)
Hemoglobin: 10.9 g/dL — ABNORMAL LOW (ref 13.0–17.0)

## 2020-08-27 MED ORDER — EPOETIN ALFA-EPBX 40000 UNIT/ML IJ SOLN
40000.0000 [IU] | Freq: Once | INTRAMUSCULAR | Status: AC
  Administered 2020-08-27: 40000 [IU] via SUBCUTANEOUS
  Filled 2020-08-27: qty 1

## 2020-09-02 ENCOUNTER — Encounter: Payer: Self-pay | Admitting: Oncology

## 2020-09-04 ENCOUNTER — Ambulatory Visit: Payer: Medicare HMO | Admitting: Internal Medicine

## 2020-09-04 NOTE — Progress Notes (Deleted)
New Outpatient Visit Date: 09/04/2020  Referring Provider: Sindy Guadeloupe, MD [place],  [place] [address]  Chief Complaint: ***  HPI:  Mr. Walls is a 70 y.o. male who is being seen today for the evaluation of hypotension in the setting of dehydration at the request of Dr. Janese Banks. He has a history of smoldering multiple myeloma, hypertension, diabetes mellitus, CKD stage IIIb, anemia of chronic disease, and chronic neutropenia/leukopenia.  He presented to the ED on 08/01/2020 with dizziness and associated diaphoresis accompanied by profound weakness.  He never passed out.  EKG was notable for first-degree AV block.  Urine drug screen was positive for cocaine and cannabinoids.  Echo was ordered but never completed.  It was felt that drug use and dehydration contributed to his symptoms.  Due to this presentation and palpitations, he was referred for further cardiac evaluation by Dr. Janese Banks she saw Mr. Sweetin on 08/06/2020.  --------------------------------------------------------------------------------------------------  Cardiovascular History & Procedures: Cardiovascular Problems:  First-degree AV block  Dizziness  Risk Factors:  Hypertension, diabetes mellitus, polysubstance abuse, male gender, and age greater than 44  Cath/PCI:  None  CV Surgery:  None  EP Procedures and Devices:  None  Non-Invasive Evaluation(s):  Renal artery Doppler (08/02/2020): No evidence of hemodynamically significant renal artery stenosis.  Recent CV Pertinent Labs: Lab Results  Component Value Date   CHOL 118 03/15/2020   HDL 49 03/15/2020   LDLCALC 53 03/15/2020   TRIG 80 03/15/2020   CHOLHDL 2.4 03/15/2020   BNP 100.7 (H) 08/02/2018   K 4.3 08/06/2020   K 4.2 10/11/2012   MG 1.9 08/01/2020   BUN 40 (H) 08/06/2020   BUN 26 03/15/2020   BUN 17 10/11/2012   CREATININE 1.83 (H) 08/06/2020   CREATININE 1.36 (H) 10/11/2012     --------------------------------------------------------------------------------------------------  Past Medical History:  Diagnosis Date  . DKA (diabetic ketoacidoses) 04/06/2016  . Hypercholesteremia   . Hypertension     Past Surgical History:  Procedure Laterality Date  . COLONOSCOPY WITH PROPOFOL N/A 02/01/2020   Procedure: COLONOSCOPY WITH PROPOFOL;  Surgeon: Lin Landsman, MD;  Location: [facility] ENDOSCOPY;  Service: Gastroenterology;  Laterality: N/A;  . ESOPHAGOGASTRODUODENOSCOPY  02/01/2020   Procedure: ESOPHAGOGASTRODUODENOSCOPY (EGD);  Surgeon: Lin Landsman, MD;  Location: [facility] ENDOSCOPY;  Service: Gastroenterology;;  . KNEE SURGERY Right    Torn meniscus  . KNEE SURGERY Left     No outpatient medications have been marked as taking for the 09/04/20 encounter (Appointment) with Antwine Agosto, Harrell Gave, MD.    Allergies: Penicillins  Social History   Tobacco Use  . Smoking status: Never Smoker  . Smokeless tobacco: Never Used  Vaping Use  . Vaping Use: Never used  Substance Use Topics  . Alcohol use: Yes    Alcohol/week: 0.0 - 1.0 standard drinks    Comment: "once every 2 months"  . Drug use: Not Currently    Types: Marijuana    Comment: "none in a long time'    Family History  Problem Relation Age of Onset  . Heart attack Father   . Hypertension Sister   . Cancer Sister     Review of Systems: A 12-system review of systems was performed and was negative except as noted in the HPI.  --------------------------------------------------------------------------------------------------  Physical Exam: There were no vitals taken for this visit.  General:  *** HEENT: No conjunctival pallor or scleral icterus. Facemask in place. Neck: Supple without lymphadenopathy, thyromegaly, JVD, or HJR. No carotid bruit. Lungs: Normal  work of breathing. Clear to auscultation bilaterally without wheezes or crackles. Heart: Regular rate and rhythm without murmurs, rubs,  or gallops. Non-displaced PMI. Abd: Bowel sounds present. Soft, NT/ND without hepatosplenomegaly Ext: No lower extremity edema. Radial, PT, and DP pulses are 2+ bilaterally Skin: Warm and dry without rash. Neuro: CNIII-XII intact. Strength and fine-touch sensation intact in upper and lower extremities bilaterally. Psych: Normal mood and affect.  EKG:  ***  Lab Results  Component Value Date   WBC 1.9 (L) 08/06/2020   HGB 10.9 (L) 08/27/2020   HCT 32.8 (L) 08/27/2020   MCV 93.2 08/06/2020   PLT 143 (L) 08/06/2020    Lab Results  Component Value Date   NA 135 08/06/2020   K 4.3 08/06/2020   CL 103 08/06/2020   CO2 25 08/06/2020   BUN 40 (H) 08/06/2020   CREATININE 1.83 (H) 08/06/2020   GLUCOSE 204 (H) 08/06/2020   ALT 18 08/06/2020    Lab Results  Component Value Date   CHOL 118 03/15/2020   HDL 49 03/15/2020   LDLCALC 53 03/15/2020   TRIG 80 03/15/2020   CHOLHDL 2.4 03/15/2020     --------------------------------------------------------------------------------------------------  ASSESSMENT AND PLAN: Harrell Gave Nicandro Perrault, MD 09/04/2020 8:14 AM

## 2020-09-11 ENCOUNTER — Inpatient Hospital Stay
Admission: EM | Admit: 2020-09-11 | Discharge: 2020-09-13 | DRG: 093 | Disposition: A | Discharge status: Home / Self Care | Payer: Medicare HMO | Attending: Internal Medicine | Admitting: Internal Medicine

## 2020-09-11 ENCOUNTER — Other Ambulatory Visit: Payer: Self-pay

## 2020-09-11 ENCOUNTER — Encounter: Payer: Self-pay | Admitting: Emergency Medicine

## 2020-09-11 DIAGNOSIS — Z79899 Other long term (current) drug therapy: Secondary | ICD-10-CM

## 2020-09-11 DIAGNOSIS — I10 Essential (primary) hypertension: Secondary | ICD-10-CM | POA: Diagnosis not present

## 2020-09-11 DIAGNOSIS — K219 Gastro-esophageal reflux disease without esophagitis: Secondary | ICD-10-CM | POA: Diagnosis present

## 2020-09-11 DIAGNOSIS — R253 Fasciculation: Secondary | ICD-10-CM | POA: Diagnosis not present

## 2020-09-11 DIAGNOSIS — E1165 Type 2 diabetes mellitus with hyperglycemia: Secondary | ICD-10-CM | POA: Diagnosis not present

## 2020-09-11 DIAGNOSIS — E875 Hyperkalemia: Secondary | ICD-10-CM | POA: Diagnosis present

## 2020-09-11 DIAGNOSIS — R4689 Other symptoms and signs involving appearance and behavior: Secondary | ICD-10-CM | POA: Diagnosis not present

## 2020-09-11 DIAGNOSIS — G253 Myoclonus: Secondary | ICD-10-CM | POA: Diagnosis not present

## 2020-09-11 DIAGNOSIS — D472 Monoclonal gammopathy: Secondary | ICD-10-CM | POA: Diagnosis present

## 2020-09-11 DIAGNOSIS — R252 Cramp and spasm: Secondary | ICD-10-CM | POA: Diagnosis not present

## 2020-09-11 DIAGNOSIS — Z20822 Contact with and (suspected) exposure to covid-19: Secondary | ICD-10-CM | POA: Diagnosis present

## 2020-09-11 DIAGNOSIS — I129 Hypertensive chronic kidney disease with stage 1 through stage 4 chronic kidney disease, or unspecified chronic kidney disease: Secondary | ICD-10-CM | POA: Diagnosis present

## 2020-09-11 DIAGNOSIS — E1065 Type 1 diabetes mellitus with hyperglycemia: Secondary | ICD-10-CM | POA: Diagnosis present

## 2020-09-11 DIAGNOSIS — Z794 Long term (current) use of insulin: Secondary | ICD-10-CM

## 2020-09-11 DIAGNOSIS — E1040 Type 1 diabetes mellitus with diabetic neuropathy, unspecified: Secondary | ICD-10-CM | POA: Diagnosis present

## 2020-09-11 DIAGNOSIS — I440 Atrioventricular block, first degree: Secondary | ICD-10-CM | POA: Diagnosis present

## 2020-09-11 DIAGNOSIS — W06XXXA Fall from bed, initial encounter: Secondary | ICD-10-CM | POA: Diagnosis present

## 2020-09-11 DIAGNOSIS — Z7982 Long term (current) use of aspirin: Secondary | ICD-10-CM

## 2020-09-11 DIAGNOSIS — R278 Other lack of coordination: Secondary | ICD-10-CM | POA: Diagnosis present

## 2020-09-11 DIAGNOSIS — R3915 Urgency of urination: Secondary | ICD-10-CM | POA: Diagnosis present

## 2020-09-11 DIAGNOSIS — R259 Unspecified abnormal involuntary movements: Secondary | ICD-10-CM | POA: Diagnosis not present

## 2020-09-11 DIAGNOSIS — Z880 Allergy status to penicillin: Secondary | ICD-10-CM

## 2020-09-11 DIAGNOSIS — R251 Tremor, unspecified: Secondary | ICD-10-CM | POA: Diagnosis present

## 2020-09-11 DIAGNOSIS — E7800 Pure hypercholesterolemia, unspecified: Secondary | ICD-10-CM | POA: Diagnosis present

## 2020-09-11 DIAGNOSIS — E10649 Type 1 diabetes mellitus with hypoglycemia without coma: Secondary | ICD-10-CM | POA: Diagnosis present

## 2020-09-11 DIAGNOSIS — Z809 Family history of malignant neoplasm, unspecified: Secondary | ICD-10-CM

## 2020-09-11 DIAGNOSIS — Z8249 Family history of ischemic heart disease and other diseases of the circulatory system: Secondary | ICD-10-CM

## 2020-09-11 DIAGNOSIS — E1022 Type 1 diabetes mellitus with diabetic chronic kidney disease: Secondary | ICD-10-CM | POA: Diagnosis present

## 2020-09-11 DIAGNOSIS — N1831 Chronic kidney disease, stage 3a: Secondary | ICD-10-CM | POA: Diagnosis present

## 2020-09-11 DIAGNOSIS — E785 Hyperlipidemia, unspecified: Secondary | ICD-10-CM | POA: Diagnosis present

## 2020-09-11 DIAGNOSIS — F1410 Cocaine abuse, uncomplicated: Secondary | ICD-10-CM | POA: Diagnosis present

## 2020-09-11 LAB — BASIC METABOLIC PANEL
Anion gap: 1 — ABNORMAL LOW (ref 5–15)
BUN: 38 mg/dL — ABNORMAL HIGH (ref 8–23)
CO2: 23 mmol/L (ref 22–32)
Calcium: 8 mg/dL — ABNORMAL LOW (ref 8.9–10.3)
Chloride: 106 mmol/L (ref 98–111)
Creatinine, Ser: 1.84 mg/dL — ABNORMAL HIGH (ref 0.61–1.24)
GFR, Estimated: 40 mL/min — ABNORMAL LOW (ref >60)
Glucose, Bld: 284 mg/dL — ABNORMAL HIGH (ref 70–99)
Potassium: 5.1 mmol/L (ref 3.5–5.1)
Sodium: 130 mmol/L — ABNORMAL LOW (ref 135–145)

## 2020-09-11 LAB — CBC
HCT: 31.3 % — ABNORMAL LOW (ref 39.0–52.0)
Hemoglobin: 10.3 g/dL — ABNORMAL LOW (ref 13.0–17.0)
MCH: 30.5 pg (ref 26.0–34.0)
MCHC: 32.9 g/dL (ref 30.0–36.0)
MCV: 92.6 fL (ref 80.0–100.0)
Platelets: 200 10*3/uL (ref 150–400)
RBC: 3.38 MIL/uL — ABNORMAL LOW (ref 4.22–5.81)
RDW: 13.8 % (ref 11.5–15.5)
WBC: 2.9 10*3/uL — ABNORMAL LOW (ref 4.0–10.5)
nRBC: 0 % (ref 0.0–0.2)

## 2020-09-11 NOTE — ED Triage Notes (Signed)
Patient presents via [facility] with c/o hyperglycemia and tremors. BS 361 for ems. Patient states tremors are new. Pt having uncontrollable tremors primarily of upper extremities during triage. Pt sent over by [facility] for tremors.

## 2020-09-12 ENCOUNTER — Emergency Department: Payer: Medicare HMO

## 2020-09-12 ENCOUNTER — Inpatient Hospital Stay: Payer: Medicare HMO

## 2020-09-12 DIAGNOSIS — F1410 Cocaine abuse, uncomplicated: Secondary | ICD-10-CM | POA: Diagnosis present

## 2020-09-12 DIAGNOSIS — G9389 Other specified disorders of brain: Secondary | ICD-10-CM | POA: Diagnosis not present

## 2020-09-12 DIAGNOSIS — E10649 Type 1 diabetes mellitus with hypoglycemia without coma: Secondary | ICD-10-CM | POA: Diagnosis not present

## 2020-09-12 DIAGNOSIS — Z809 Family history of malignant neoplasm, unspecified: Secondary | ICD-10-CM | POA: Diagnosis not present

## 2020-09-12 DIAGNOSIS — Z7982 Long term (current) use of aspirin: Secondary | ICD-10-CM | POA: Diagnosis not present

## 2020-09-12 DIAGNOSIS — I129 Hypertensive chronic kidney disease with stage 1 through stage 4 chronic kidney disease, or unspecified chronic kidney disease: Secondary | ICD-10-CM | POA: Diagnosis not present

## 2020-09-12 DIAGNOSIS — R253 Fasciculation: Secondary | ICD-10-CM | POA: Diagnosis present

## 2020-09-12 DIAGNOSIS — R251 Tremor, unspecified: Secondary | ICD-10-CM | POA: Diagnosis not present

## 2020-09-12 DIAGNOSIS — Z8249 Family history of ischemic heart disease and other diseases of the circulatory system: Secondary | ICD-10-CM | POA: Diagnosis not present

## 2020-09-12 DIAGNOSIS — I10 Essential (primary) hypertension: Secondary | ICD-10-CM | POA: Diagnosis not present

## 2020-09-12 DIAGNOSIS — E875 Hyperkalemia: Secondary | ICD-10-CM | POA: Diagnosis not present

## 2020-09-12 DIAGNOSIS — N179 Acute kidney failure, unspecified: Secondary | ICD-10-CM | POA: Diagnosis not present

## 2020-09-12 DIAGNOSIS — N189 Chronic kidney disease, unspecified: Secondary | ICD-10-CM | POA: Diagnosis not present

## 2020-09-12 DIAGNOSIS — W06XXXA Fall from bed, initial encounter: Secondary | ICD-10-CM | POA: Diagnosis present

## 2020-09-12 DIAGNOSIS — R4182 Altered mental status, unspecified: Secondary | ICD-10-CM | POA: Diagnosis not present

## 2020-09-12 DIAGNOSIS — Z79899 Other long term (current) drug therapy: Secondary | ICD-10-CM | POA: Diagnosis not present

## 2020-09-12 DIAGNOSIS — R3915 Urgency of urination: Secondary | ICD-10-CM | POA: Diagnosis present

## 2020-09-12 DIAGNOSIS — Z880 Allergy status to penicillin: Secondary | ICD-10-CM | POA: Diagnosis not present

## 2020-09-12 DIAGNOSIS — D472 Monoclonal gammopathy: Secondary | ICD-10-CM | POA: Diagnosis not present

## 2020-09-12 DIAGNOSIS — R739 Hyperglycemia, unspecified: Secondary | ICD-10-CM | POA: Diagnosis not present

## 2020-09-12 DIAGNOSIS — K219 Gastro-esophageal reflux disease without esophagitis: Secondary | ICD-10-CM | POA: Diagnosis not present

## 2020-09-12 DIAGNOSIS — E160 Drug-induced hypoglycemia without coma: Secondary | ICD-10-CM | POA: Diagnosis not present

## 2020-09-12 DIAGNOSIS — Z794 Long term (current) use of insulin: Secondary | ICD-10-CM | POA: Diagnosis not present

## 2020-09-12 DIAGNOSIS — T383X5A Adverse effect of insulin and oral hypoglycemic [antidiabetic] drugs, initial encounter: Secondary | ICD-10-CM

## 2020-09-12 DIAGNOSIS — E1022 Type 1 diabetes mellitus with diabetic chronic kidney disease: Secondary | ICD-10-CM | POA: Diagnosis not present

## 2020-09-12 DIAGNOSIS — E1065 Type 1 diabetes mellitus with hyperglycemia: Secondary | ICD-10-CM | POA: Diagnosis present

## 2020-09-12 DIAGNOSIS — G253 Myoclonus: Secondary | ICD-10-CM | POA: Diagnosis not present

## 2020-09-12 DIAGNOSIS — E785 Hyperlipidemia, unspecified: Secondary | ICD-10-CM | POA: Diagnosis present

## 2020-09-12 DIAGNOSIS — R278 Other lack of coordination: Secondary | ICD-10-CM | POA: Diagnosis present

## 2020-09-12 DIAGNOSIS — E7800 Pure hypercholesterolemia, unspecified: Secondary | ICD-10-CM | POA: Diagnosis present

## 2020-09-12 DIAGNOSIS — E1040 Type 1 diabetes mellitus with diabetic neuropathy, unspecified: Secondary | ICD-10-CM | POA: Diagnosis not present

## 2020-09-12 DIAGNOSIS — I440 Atrioventricular block, first degree: Secondary | ICD-10-CM | POA: Diagnosis present

## 2020-09-12 DIAGNOSIS — R29818 Other symptoms and signs involving the nervous system: Secondary | ICD-10-CM | POA: Diagnosis not present

## 2020-09-12 DIAGNOSIS — Z20822 Contact with and (suspected) exposure to covid-19: Secondary | ICD-10-CM | POA: Diagnosis not present

## 2020-09-12 DIAGNOSIS — N1831 Chronic kidney disease, stage 3a: Secondary | ICD-10-CM | POA: Diagnosis present

## 2020-09-12 LAB — CBC
HCT: 32.8 % — ABNORMAL LOW (ref 39.0–52.0)
Hemoglobin: 10.7 g/dL — ABNORMAL LOW (ref 13.0–17.0)
MCH: 30.2 pg (ref 26.0–34.0)
MCHC: 32.6 g/dL (ref 30.0–36.0)
MCV: 92.7 fL (ref 80.0–100.0)
Platelets: 209 10*3/uL (ref 150–400)
RBC: 3.54 MIL/uL — ABNORMAL LOW (ref 4.22–5.81)
RDW: 13.7 % (ref 11.5–15.5)
WBC: 2.8 10*3/uL — ABNORMAL LOW (ref 4.0–10.5)
nRBC: 0 % (ref 0.0–0.2)

## 2020-09-12 LAB — RESP PANEL BY RT-PCR (FLU A&B, COVID) ARPGX2
Influenza A by PCR: NEGATIVE
Influenza B by PCR: NEGATIVE
SARS Coronavirus 2 by RT PCR: NEGATIVE

## 2020-09-12 LAB — CBG MONITORING, ED
Glucose-Capillary: 114 mg/dL — ABNORMAL HIGH (ref 70–99)
Glucose-Capillary: 142 mg/dL — ABNORMAL HIGH (ref 70–99)
Glucose-Capillary: 146 mg/dL — ABNORMAL HIGH (ref 70–99)

## 2020-09-12 LAB — AMMONIA: Ammonia: 23 umol/L (ref 9–35)

## 2020-09-12 LAB — HEPATIC FUNCTION PANEL
ALT: 15 U/L (ref 0–44)
AST: 22 U/L (ref 15–41)
Albumin: 3 g/dL — ABNORMAL LOW (ref 3.5–5.0)
Alkaline Phosphatase: 37 U/L — ABNORMAL LOW (ref 38–126)
Bilirubin, Direct: 0.2 mg/dL (ref 0.0–0.2)
Indirect Bilirubin: 0.5 mg/dL (ref 0.3–0.9)
Total Bilirubin: 0.7 mg/dL (ref 0.3–1.2)
Total Protein: 8.4 g/dL — ABNORMAL HIGH (ref 6.5–8.1)

## 2020-09-12 LAB — BASIC METABOLIC PANEL
Anion gap: 6 (ref 5–15)
BUN: 32 mg/dL — ABNORMAL HIGH (ref 8–23)
CO2: 24 mmol/L (ref 22–32)
Calcium: 8.2 mg/dL — ABNORMAL LOW (ref 8.9–10.3)
Chloride: 105 mmol/L (ref 98–111)
Creatinine, Ser: 1.47 mg/dL — ABNORMAL HIGH (ref 0.61–1.24)
GFR, Estimated: 52 mL/min — ABNORMAL LOW (ref >60)
Glucose, Bld: 55 mg/dL — ABNORMAL LOW (ref 70–99)
Potassium: 3.7 mmol/L (ref 3.5–5.1)
Sodium: 135 mmol/L (ref 135–145)

## 2020-09-12 LAB — MAGNESIUM: Magnesium: 2 mg/dL (ref 1.7–2.4)

## 2020-09-12 LAB — CK: Total CK: 177 U/L (ref 49–397)

## 2020-09-12 LAB — URINE DRUG SCREEN, QUALITATIVE (ARMC ONLY)
Amphetamines, Ur Screen: NOT DETECTED
Barbiturates, Ur Screen: NOT DETECTED
Benzodiazepine, Ur Scrn: NOT DETECTED
Cannabinoid 50 Ng, Ur ~~LOC~~: POSITIVE — AB
Cocaine Metabolite,Ur ~~LOC~~: POSITIVE — AB
MDMA (Ecstasy)Ur Screen: NOT DETECTED
Methadone Scn, Ur: NOT DETECTED
Opiate, Ur Screen: NOT DETECTED
Phencyclidine (PCP) Ur S: NOT DETECTED
Tricyclic, Ur Screen: NOT DETECTED

## 2020-09-12 LAB — URINALYSIS, COMPLETE (UACMP) WITH MICROSCOPIC
Bacteria, UA: NONE SEEN
Bilirubin Urine: NEGATIVE
Glucose, UA: >=500 mg/dL — AB
Ketones, ur: NEGATIVE mg/dL
Leukocytes,Ua: NEGATIVE
Nitrite: NEGATIVE
Protein, ur: NEGATIVE mg/dL
Specific Gravity, Urine: 1.009 (ref 1.005–1.030)
Squamous Epithelial / HPF: NONE SEEN (ref 0–5)
pH: 5 (ref 5.0–8.0)

## 2020-09-12 LAB — GLUCOSE, CAPILLARY
Glucose-Capillary: 303 mg/dL — ABNORMAL HIGH (ref 70–99)
Glucose-Capillary: 128 mg/dL — ABNORMAL HIGH (ref 70–99)
Glucose-Capillary: 190 mg/dL — ABNORMAL HIGH (ref 70–99)

## 2020-09-12 LAB — TSH: TSH: 1.722 u[IU]/mL (ref 0.350–4.500)

## 2020-09-12 IMAGING — MR MR HEAD W/O CM
11 series · 44 of 48 positions shown · non-contrast
Comparison: Head CT [9U] hours today, and earlier.

CLINICAL DATA: 69-year-old male with altered mental status.
Hyperglycemia. Tremors.

EXAM:
MRI HEAD WITHOUT CONTRAST
TECHNIQUE: Multiplanar, multiecho pulse sequences of the brain and surrounding
structures were obtained without intravenous contrast.

[Series 5: ax dwi_tracew · axial · 3.0mm · 0.65mm/px · z∈[-135,+19]mm · 4 of 48 slices shown]
[im 1/48]
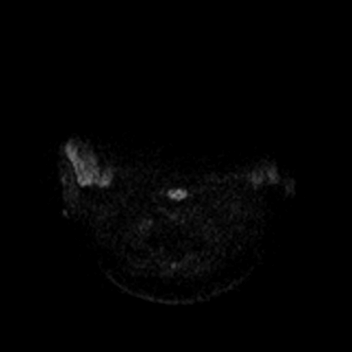
[im 16/48]
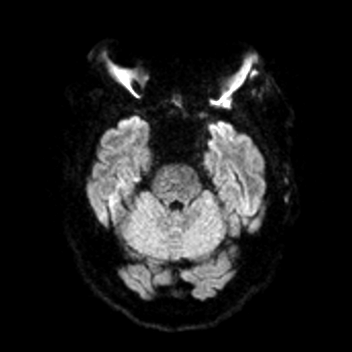
[im 32/48]
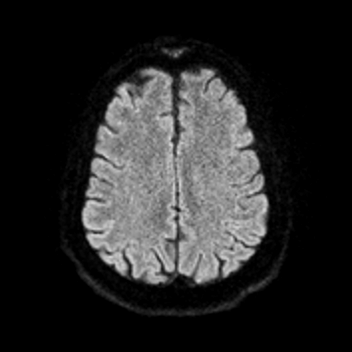
[im 48/48]
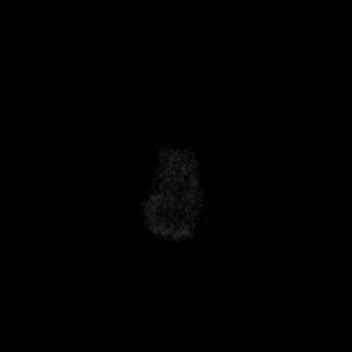

[Series 6: ax dwi_adc · axial · 3.0mm · 0.65mm/px · z∈[-135,+19]mm · 4 of 48 slices shown]
[im 1/48]
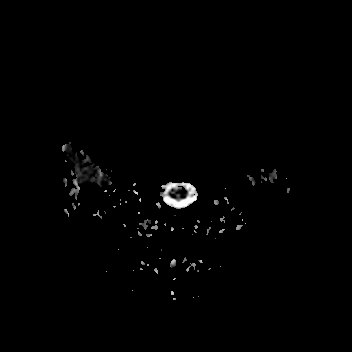
[im 16/48]
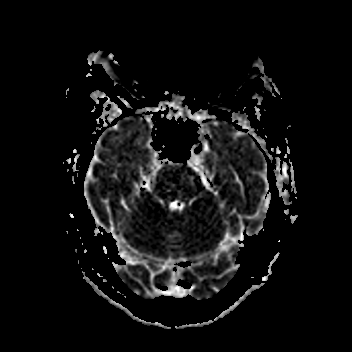
[im 32/48]
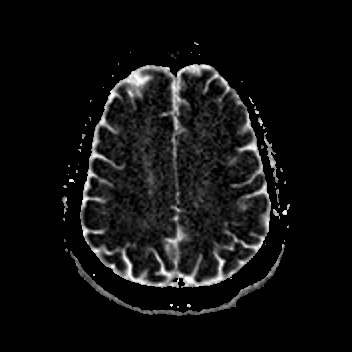
[im 48/48]
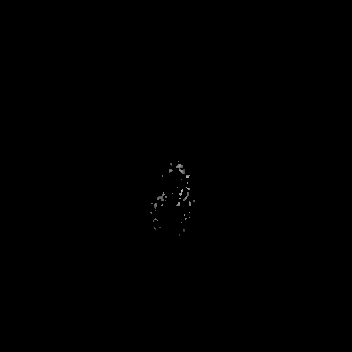

[Series 7: cor dwi_tracew · coronal · 5.0mm · 0.65mm/px · 3 of 36 slices shown]
[im 1/36]
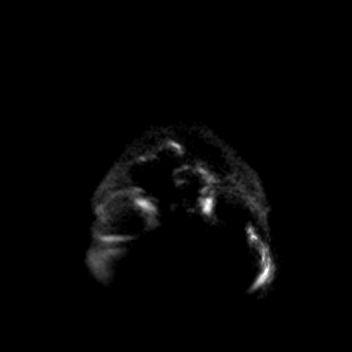
[im 18/36]
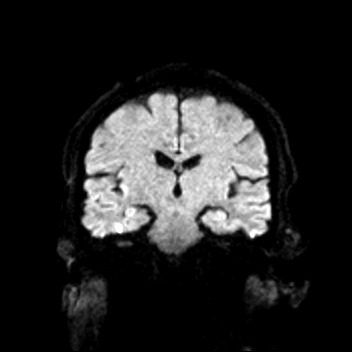
[im 36/36]
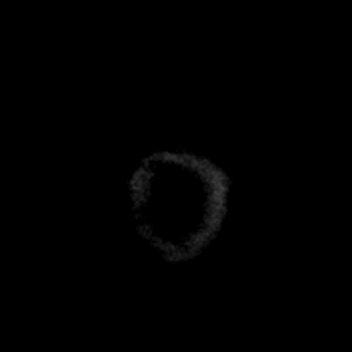

[Series 8: cor dwi_adc · coronal · 5.0mm · 0.65mm/px · 3 of 36 slices shown]
[im 1/36]
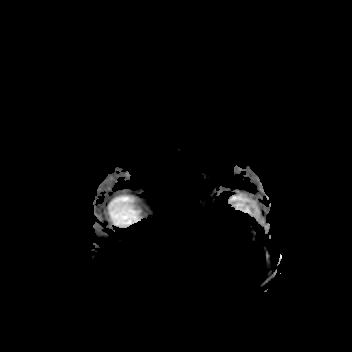
[im 18/36]
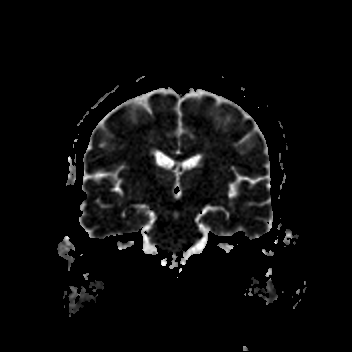
[im 36/36]
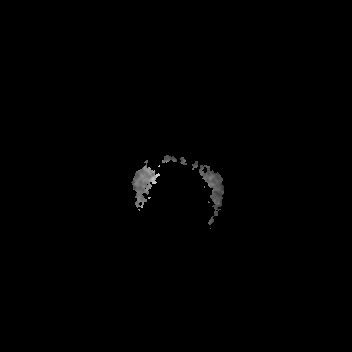

[Series 9: T1 · sagittal · 5.0mm · 0.62mm/px · 2 of 23 slices shown (1 of 2)]
[im 1/23]
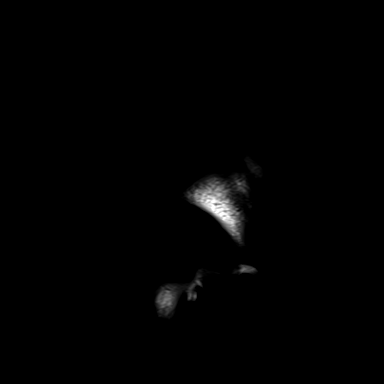
[im 23/23]
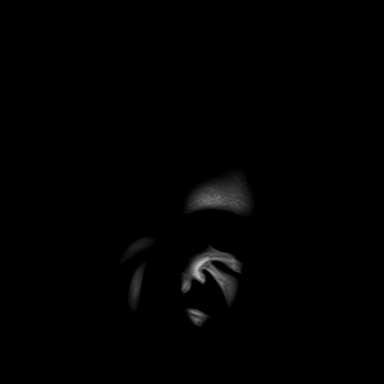

[Series 10: T2 · axial · 5.0mm · 0.53mm/px · z∈[-111,+33]mm · 2 of 25 slices shown (1 of 2)]
[im 1/25]
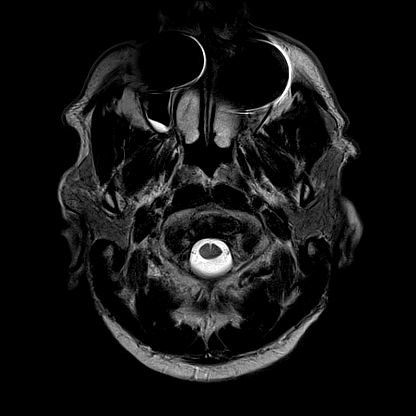
[im 25/25]
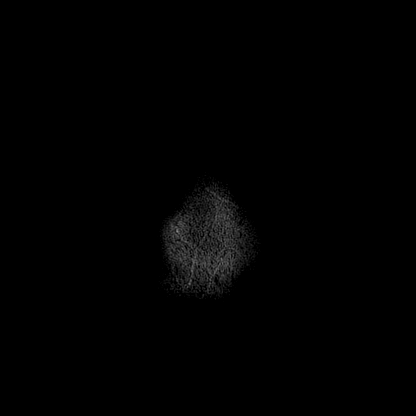

[Series 12: pha_images · axial · 3.0mm · 0.90mm/px · z∈[-123,+47]mm · 5 of 58 slices shown]
[im 1/58]
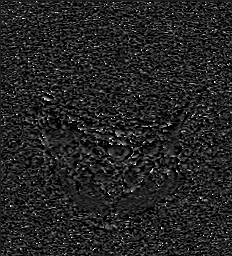
[im 15/58]
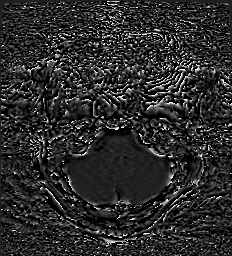
[im 29/58]
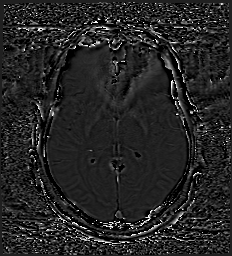
[im 43/58]
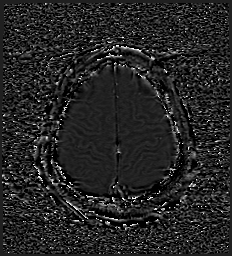
[im 58/58]
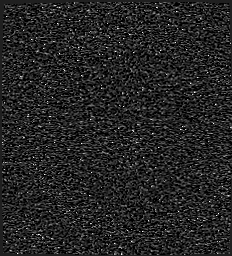

[Series 13: swi_images · axial · 3.0mm · 0.90mm/px · z∈[-126,+50]mm · 5 of 60 slices shown]
[im 1/60]
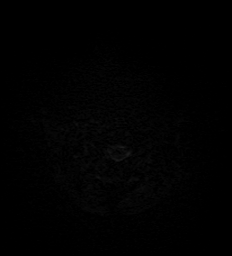
[im 15/60]
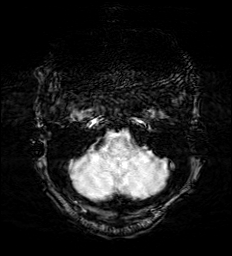
[im 30/60]
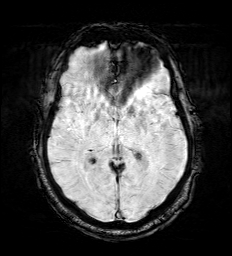
[im 45/60]
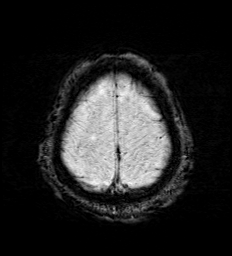
[im 60/60]
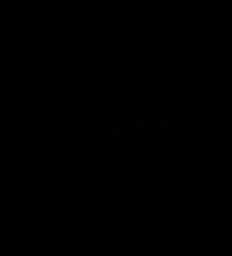

[Series 15: FLAIR · axial · 3.0mm · 0.53mm/px · z∈[-120,+42]mm · 5 of 55 slices shown]
[im 1/55]
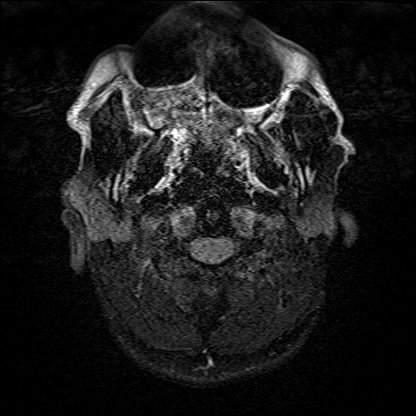
[im 14/55]
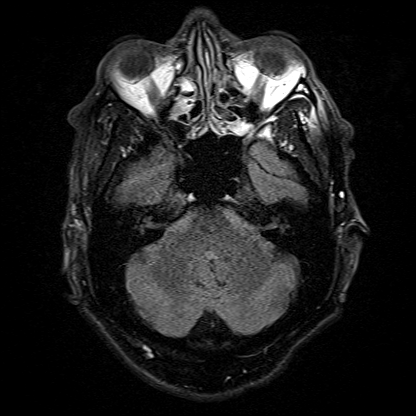
[im 28/55]
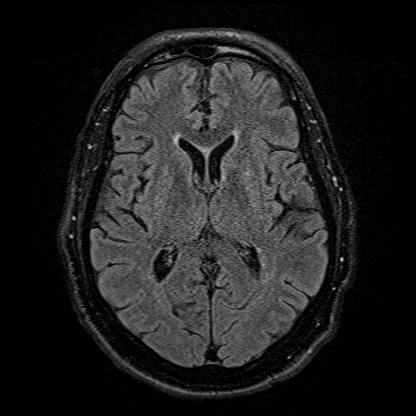
[im 41/55]
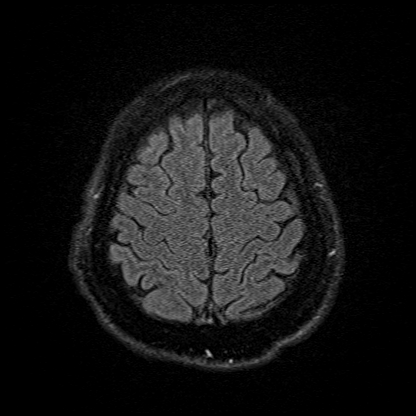
[im 55/55]
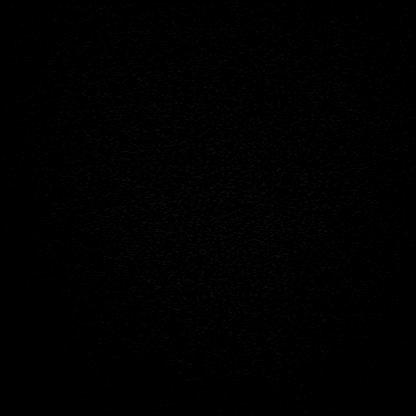

[Series 16: T1 · axial · 1.0mm · 0.98mm/px · z∈[-118,+41]mm · 9 of 160 slices shown (2 of 2)]
[im 1/160]
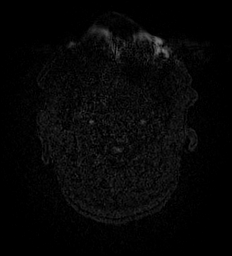
[im 14/160]
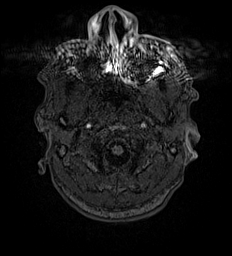
[im 27/160]
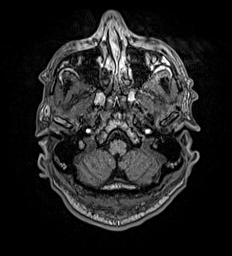
[im 54/160]
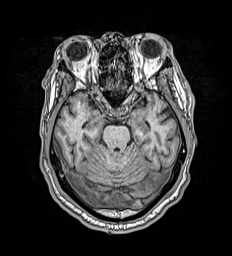
[im 67/160]
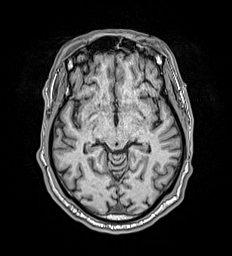
[im 93/160]
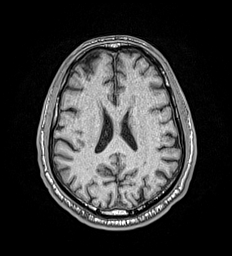
[im 107/160]
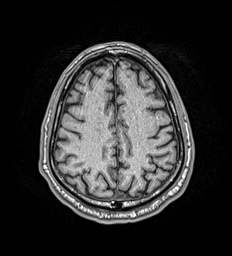
[im 133/160]
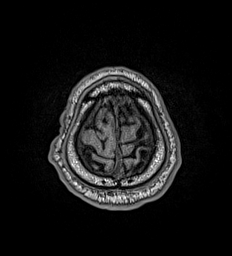
[im 160/160]
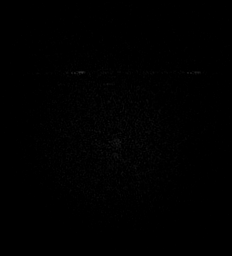

[Series 17: T2 · coronal · 5.0mm · 0.57mm/px · 2 of 29 slices shown (2 of 2)]
[im 1/29]
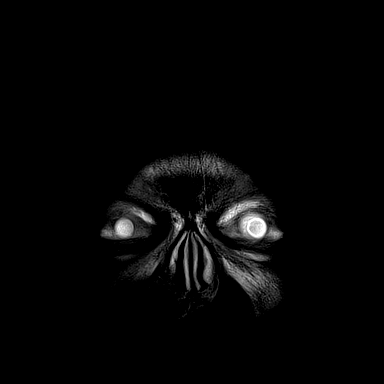
[im 29/29]
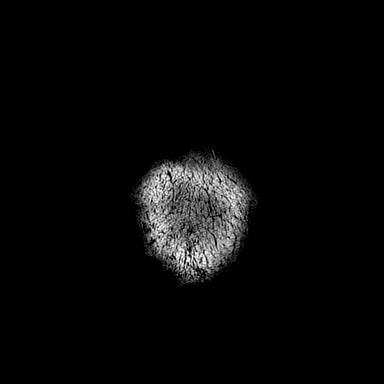

[44 of 48 positions shown; findings below may reference images not displayed]

FINDINGS: Brain: No restricted diffusion to suggest acute infarction. No
midline shift, mass effect, evidence of mass lesion,
ventriculomegaly, extra-axial collection or acute intracranial
hemorrhage. Cervicomedullary junction and pituitary are within
normal limits.

Patchy mostly periventricular white matter T2 and FLAIR
hyperintensity is mild to moderate for age. Occasional scattered
subcortical white matter involvement. Superimposed several chronic
microhemorrhages in the bilateral corona radiata and left caudate
(series 13, image 35). But no cortical encephalomalacia or other
chronic cerebral blood products identified. The other deep gray
matter nuclei, the brainstem and cerebellum appear negative.

Vascular: Major intracranial vascular flow voids are preserved.

Skull and upper cervical spine: Negative. Visualized bone marrow
signal is within normal limits.

Sinuses/Orbits: Negative orbits. Trace paranasal sinus mucosal
thickening. Dental hardware susceptibility artifact.

Other: Mastoids are clear. Grossly normal visible internal auditory
structures. Visible scalp and face appear negative.
IMPRESSION: 1. No acute intracranial abnormality.
2. Evidence of moderate for age chronic small vessel disease,
including several chronic micro-hemorrhages in the bilateral corona
radiata and the left caudate.

## 2020-09-12 IMAGING — CT CT HEAD W/O CM
3 of 6 series · 15 of 47 positions shown, 18 images · non-contrast
Comparison: [DATE]

CLINICAL DATA: Acute neurological deficit, suspecting stroke.
Hyperglycemia and tremors.

EXAM:
CT HEAD WITHOUT CONTRAST
TECHNIQUE: Contiguous axial images were obtained from the base of the skull
through the vertex without intravenous contrast.

[Series 2: head wo · axial · 0.42mm/px · z∈[-103,+17]mm · 10 of 29 slices shown, 13 images]
[im 3/29  brain]
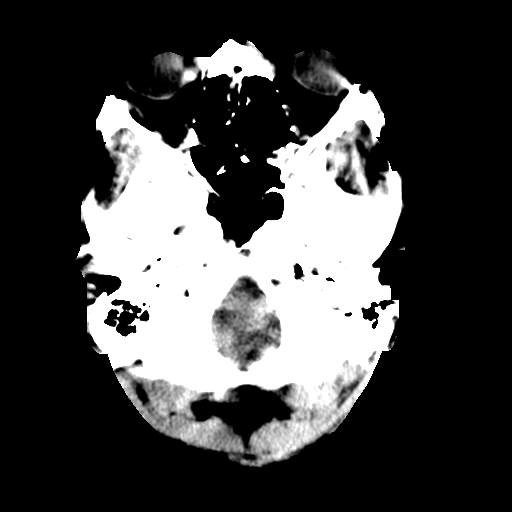
[im 3/29  bone]
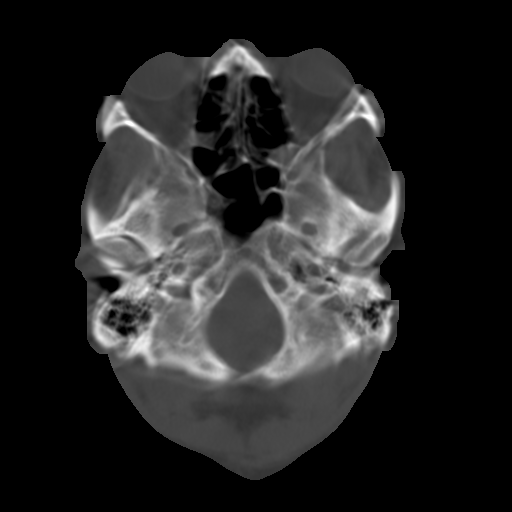
[im 5/29  brain]
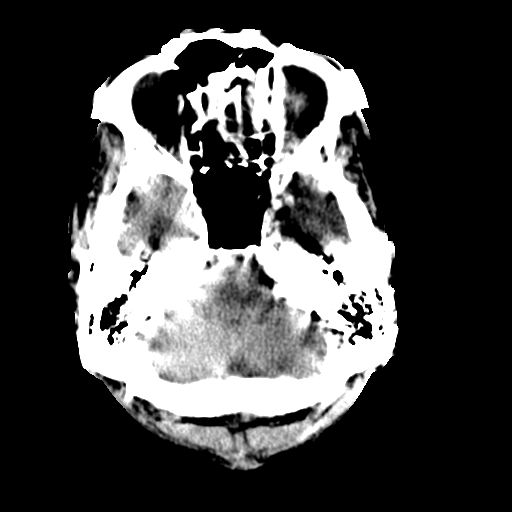
[im 9/29  brain]
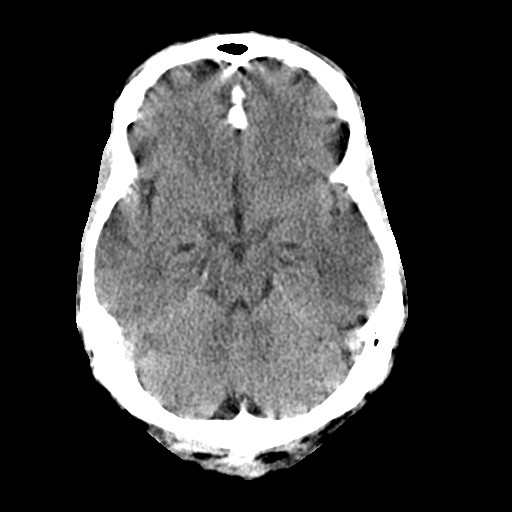
[im 11/29  brain]
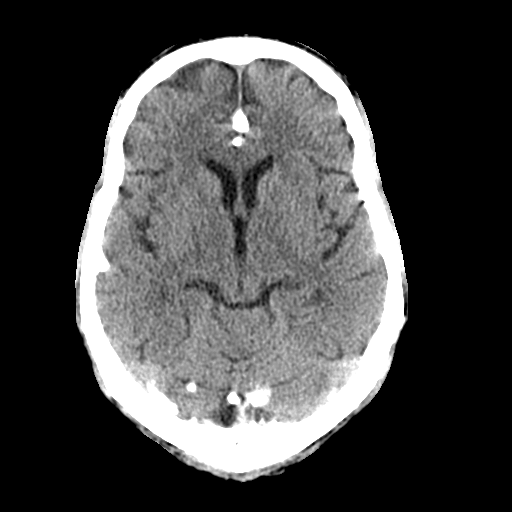
[im 13/29  brain]
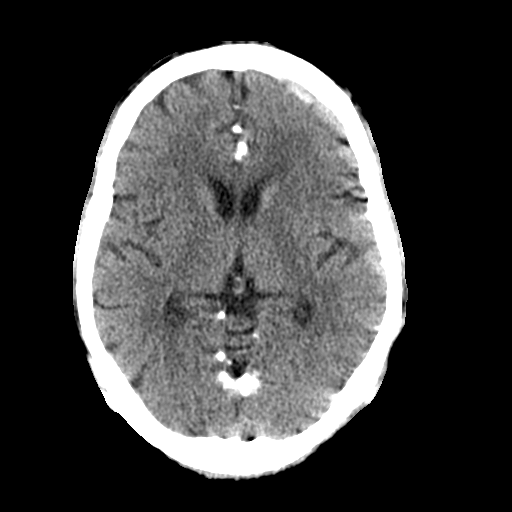
[im 13/29  bone]
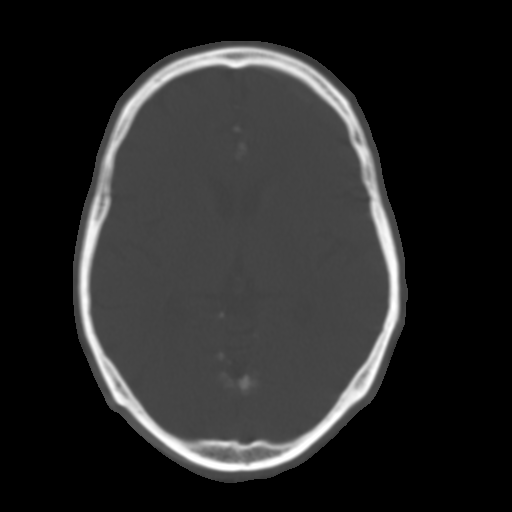
[im 17/29  brain]
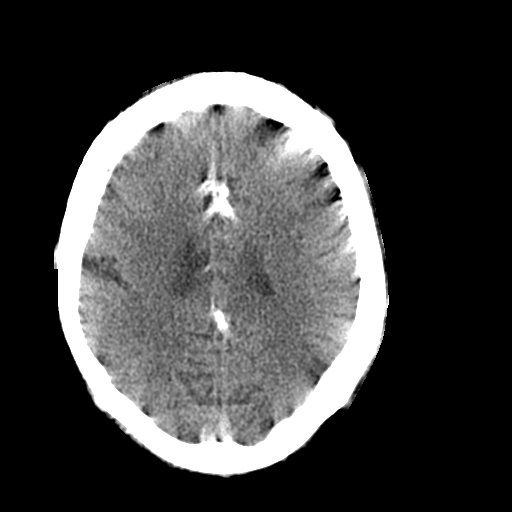
[im 19/29  brain]
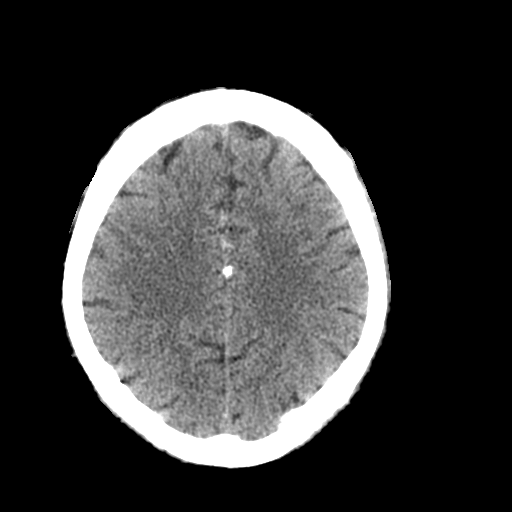
[im 21/29  brain]
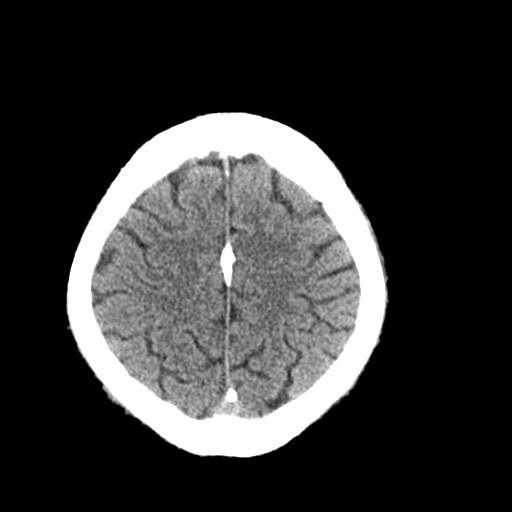
[im 25/29  brain]
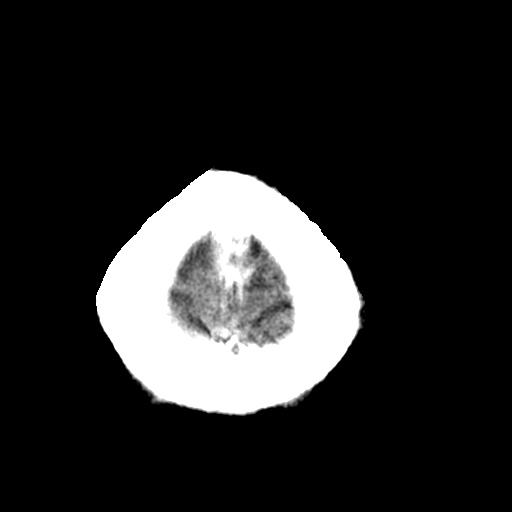
[im 25/29  bone]
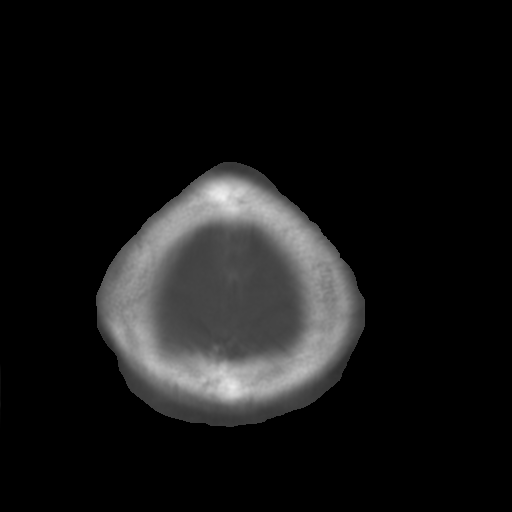
[im 27/29  brain]
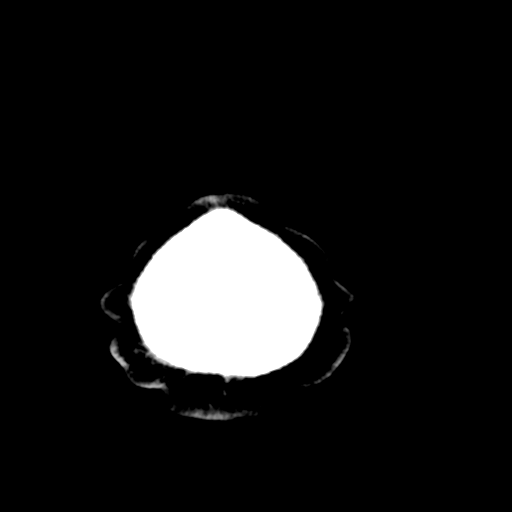

[Series 8: coronal soft tissue · coronal · 0.32mm/px · 3 of 67 slices shown]
[im 17/67  brain]
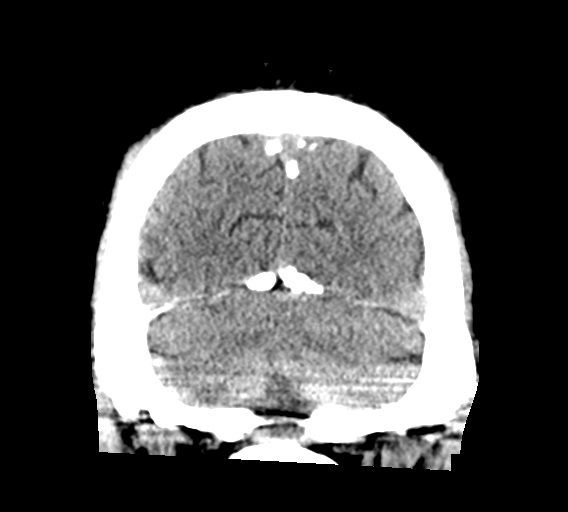
[im 34/67  brain]
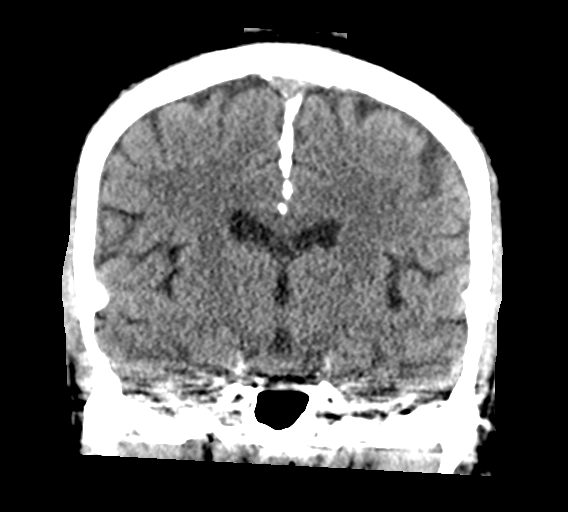
[im 50/67  brain]
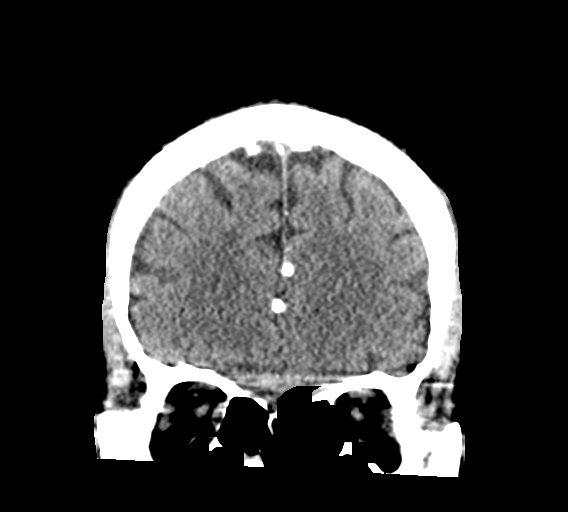

[Series 9: sagittal soft tissue · sagittal · 0.32mm/px · 2 of 54 slices shown]
[im 18/54  brain]
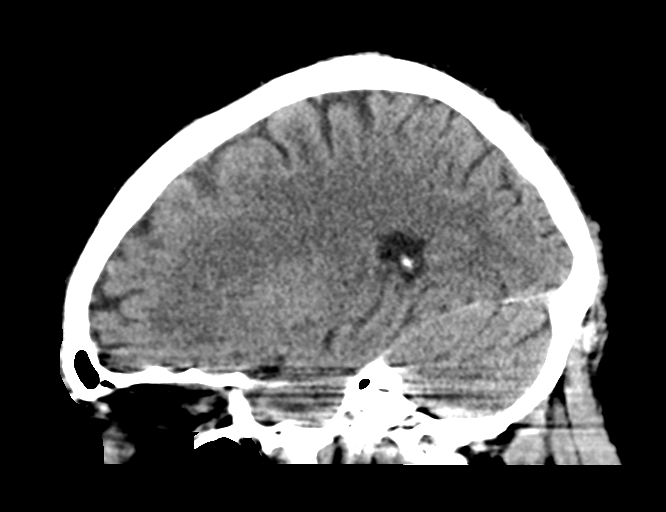
[im 36/54  brain]
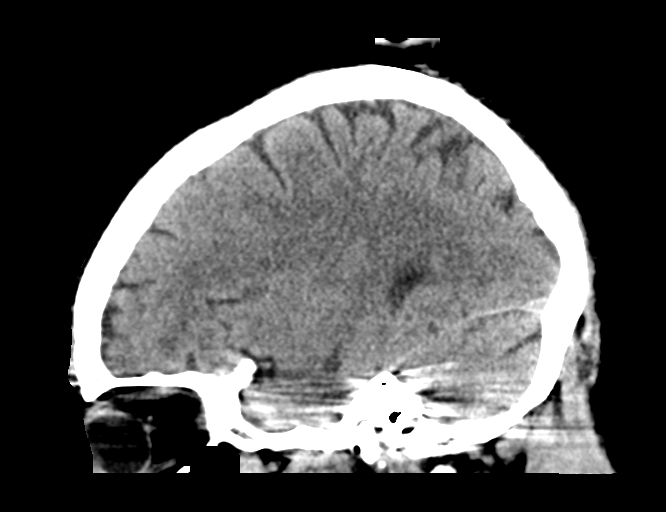

[15 of 47 positions shown; findings below may reference images not displayed]

FINDINGS: Brain: Motion artifact limits examination even with repeat imaging.
No evidence of mass effect or midline shift. No abnormal extra-axial
fluid collections. Gray-white matter junctions are distinct. Basal
cisterns are not effaced. Multiple calcifications in the falx and
tentorium, unchanged. No acute intracranial hemorrhage.

Vascular: No hyperdense vessel or unexpected calcification.

Skull: Calvarium appears intact.

Sinuses/Orbits: Opacification of some of the ethmoid air cells.
Paranasal sinuses and mastoid air cells are otherwise clear.

Other: No significant changes since prior study.
IMPRESSION: No acute intracranial abnormalities.

## 2020-09-12 MED ORDER — ASPIRIN 81 MG PO CHEW
81.0000 mg | CHEWABLE_TABLET | Freq: Every day | ORAL | Status: DC
  Administered 2020-09-12 – 2020-09-13 (×2): 81 mg via ORAL
  Filled 2020-09-12 (×2): qty 1

## 2020-09-12 MED ORDER — INSULIN ASPART 100 UNIT/ML IJ SOLN
0.0000 [IU] | Freq: Three times a day (TID) | INTRAMUSCULAR | Status: DC
  Administered 2020-09-12: 11 [IU] via SUBCUTANEOUS
  Administered 2020-09-12 – 2020-09-13 (×2): 3 [IU] via SUBCUTANEOUS
  Administered 2020-09-13: 5 [IU] via SUBCUTANEOUS
  Filled 2020-09-12 (×5): qty 1

## 2020-09-12 MED ORDER — AMMONIA AROMATIC IN INHA
RESPIRATORY_TRACT | Status: AC
  Filled 2020-09-12: qty 10

## 2020-09-12 MED ORDER — MAGNESIUM HYDROXIDE 400 MG/5ML PO SUSP: 30.0000 mL | Freq: Every day | ORAL | Status: DC | PRN

## 2020-09-12 MED ORDER — ONDANSETRON HCL 4 MG/2ML IJ SOLN: 4.0000 mg | Freq: Four times a day (QID) | INTRAMUSCULAR | Status: DC | PRN

## 2020-09-12 MED ORDER — SODIUM CHLORIDE 0.9 % IV SOLN: INTRAVENOUS | Status: DC

## 2020-09-12 MED ORDER — INSULIN DEGLUDEC 100 UNIT/ML ~~LOC~~ SOPN: 10.0000 [IU] | PEN_INJECTOR | Freq: Every day | SUBCUTANEOUS | Status: DC

## 2020-09-12 MED ORDER — AMLODIPINE BESYLATE 5 MG PO TABS
10.0000 mg | ORAL_TABLET | Freq: Every day | ORAL | Status: DC
  Administered 2020-09-12 – 2020-09-13 (×2): 10 mg via ORAL
  Filled 2020-09-12 (×2): qty 2

## 2020-09-12 MED ORDER — ENOXAPARIN SODIUM 40 MG/0.4ML IJ SOSY
40.0000 mg | PREFILLED_SYRINGE | INTRAMUSCULAR | Status: DC
  Administered 2020-09-12 – 2020-09-13 (×2): 40 mg via SUBCUTANEOUS
  Filled 2020-09-12 (×2): qty 0.4

## 2020-09-12 MED ORDER — GABAPENTIN 300 MG PO CAPS: 300.0000 mg | ORAL_CAPSULE | Freq: Two times a day (BID) | ORAL | Status: DC

## 2020-09-12 MED ORDER — PRAVASTATIN SODIUM 40 MG PO TABS
40.0000 mg | ORAL_TABLET | Freq: Every day | ORAL | Status: DC
  Administered 2020-09-12: 40 mg via ORAL
  Filled 2020-09-12: qty 2
  Filled 2020-09-12 (×2): qty 1

## 2020-09-12 MED ORDER — DEXTROSE 50 % IV SOLN
1.0000 | Freq: Once | INTRAVENOUS | Status: AC
  Administered 2020-09-12: 50 mL via INTRAVENOUS
  Filled 2020-09-12: qty 50

## 2020-09-12 MED ORDER — PREGABALIN 75 MG PO CAPS
75.0000 mg | ORAL_CAPSULE | Freq: Two times a day (BID) | ORAL | Status: DC
  Administered 2020-09-12 – 2020-09-13 (×2): 75 mg via ORAL
  Filled 2020-09-12 (×2): qty 1

## 2020-09-12 MED ORDER — ONDANSETRON HCL 4 MG PO TABS: 4.0000 mg | ORAL_TABLET | Freq: Four times a day (QID) | ORAL | Status: DC | PRN

## 2020-09-12 MED ORDER — ACETAMINOPHEN 325 MG PO TABS
650.0000 mg | ORAL_TABLET | Freq: Four times a day (QID) | ORAL | Status: DC | PRN
  Administered 2020-09-12 – 2020-09-13 (×2): 650 mg via ORAL
  Filled 2020-09-12 (×2): qty 2

## 2020-09-12 MED ORDER — ACETAMINOPHEN 650 MG RE SUPP: 650.0000 mg | Freq: Four times a day (QID) | RECTAL | Status: DC | PRN

## 2020-09-12 MED ORDER — LORAZEPAM 2 MG/ML IJ SOLN
1.0000 mg | Freq: Once | INTRAMUSCULAR | Status: AC
  Administered 2020-09-12: 1 mg via INTRAVENOUS
  Filled 2020-09-12: qty 1

## 2020-09-12 MED ORDER — TRAZODONE HCL 50 MG PO TABS
25.0000 mg | ORAL_TABLET | Freq: Every evening | ORAL | Status: DC | PRN
  Administered 2020-09-12: 25 mg via ORAL
  Filled 2020-09-12 (×2): qty 1

## 2020-09-12 NOTE — Progress Notes (Signed)
Inpatient Diabetes Program Recommendations  AACE/ADA: New Consensus Statement on Inpatient Glycemic Control   Target Ranges:  Prepandial:   less than 140 mg/dL      Peak postprandial:   less than 180 mg/dL (1-2 hours)      Critically ill patients:  140 - 180 mg/dL   Results for ELBER, GALYEAN (MRN [id number]) as of 09/12/2020 09:29  Ref. Range 09/12/2020 00:02 09/12/2020 07:19 09/12/2020 08:06  Glucose-Capillary Latest Ref Range: 70 - 99 mg/dL 114 (H) 146 (H) 142 (H)  Results for LORENCE, NAGENGAST (MRN [id number]) as of 09/12/2020 09:29  Ref. Range 09/11/2020 20:11 09/12/2020 05:21  Glucose Latest Ref Range: 70 - 99 mg/dL 284 (H) 55 (L)    Review of Glycemic Control  Diabetes history: DM1 Outpatient Diabetes medications: Tresiba 25 units QAM, Novolog 0-10 units TID with meals (dose based on glucose per sliding scale) Current orders for Inpatient glycemic control: Novolog 0-15 units TID with meals and HS  Inpatient Diabetes Program Recommendations:    Insulin: Please decrease Novolog correction to 0-9 units and change to Q4H (for closer glucose monitoring and so patient can consistently get insulin correction if needed).  Noted glucose down to 55 mg at 5:21 am today; patient reports he took [name] 25 units yesterday morning at home.  Please consider ordering Lantus 15 units Q24H.   NOTE: In reviewing chart, noted patient seen Dr. Honor Junes (Endocrinologist) for initial consult on 05/05/2019 and that was the only noted visit with Dr. Honor Junes. Also noted patient was inpatient 11/06/19-11/07/19 and I spoke with patient on 11/07/19 during that hospitalization. Per H&P on 09/12/20, patient admitted with muscle twitching, initially hyperglycemia and later developing hypoglycemia this morning at 5:21 am and noted to have fall in ED (note by Lorenza Evangelist, RN at 5:34 am today. Spoke with patient over phone to inquire about DM regimen and control. Patient reports that he is consistently taking Tresiba 25 units QAM  and Novolog 0-10 units TID with meals (dose based on sliding scale). Patient states his glucose usually runs ok and notes hypoglycemia every day (in the 50-60 mg/dl) and frequently fasting glucose 70-80's mg/dl.  Patient states that he has not had any changes with outpatient insulin regimen recently. Discussed dangers of hypoglycemia. Explained that if he is having hypoglycemia daily, he likely needs to have insulin regimen adjusted and he may need to have Tresiba dose decreased especially if fasting is frequently 70-80 mg/dl. Patient does not recall hypoglycemic episode from this morning when glucose was 55 mg/dl and he fell in the Emergency Department early this morning. Inquired about seeing Dr. Honor Junes (Endocrinologist) for initial visit on 05/05/19 and no follow up since then. Patient stated "I forgot about him."  Encouraged patient to get re-established with Dr. Honor Junes so he can assist with getting DM under better control especially given he has DM1. Patient states he would be willing to see him again. Informed patient I would add a reminder to discharge paperwork to call Dr. Honor Junes to set up appointment.  Asked patient to be sure to pay attention to discharge orders in case his insulin regimen is changed at time of discharge. Patient verbalized understanding of information and states that he has no further questions at this time.  Thanks, Barnie Alderman, RN, MSN, CDE Diabetes Coordinator Inpatient Diabetes Program [phone] (Team Pager from 8am to 5pm)

## 2020-09-12 NOTE — H&P (Signed)
[place]   PATIENT NAME: Nicholas Weiss    MR#:  [id number]  DATE OF BIRTH:  10/04/1950  DATE OF ADMISSION:  09/11/2020  PRIMARY CARE PHYSICIAN: Birdie Sons, MD   Patient is coming from: Home  REQUESTING/REFERRING PHYSICIAN: Ward, Cyril Mourning, DO  CHIEF COMPLAINT:   Chief Complaint  Patient presents with  . Tremors  . Hyperglycemia    HISTORY OF PRESENT ILLNESS:  Nicholas Weiss is a 70 y.o. male with medical history significant for type 1 diabetes mellitus, hypertension and dyslipidemia, who presented to the emergency room with acute onset of diffuse right-sided muscle twitches that started around noon yesterday.  He described it he as "jumping" that she has not experienced before.  She denies any paresthesias or focal muscle weakness, headache or dizziness or blurred vision, dysphagia or dysarthria.  No fever or chills.  No dyspnea or cough or wheezing or hemoptysis.  No chest pain or palpitations.  He was noted to be hyper glycemic when she was seen in urgent care for assessment of her twitches.  He he was then referred to the emergency room.  No headache or dizziness or blurred vision.  No neck pain or stiffness.  The patient was seen by teleneurology in the emergency room.  He later developed hypoglycemia with blood glucose of 55  in the morning and had an accidental fall from bed without injuries.  ED Course: When she came to the ER temperature was 99.2 and blood pressure 147/97 and later 200/112 with otherwise normal vital signs.  Labs revealed potassium of 5.1 and sodium 130 with blood glucose of 284, BUN of 38 and creatinine 1.84 Compared to 38/1.48 on 08/02/2020.  CBC showed leukopenia of 2.9 and anemia close to baseline.  UA was unremarkable.    EKG as reviewed by me : Showed sinus rhythm with a rate of 74 with first-degree AV block Imaging: Noncontrast head CT scan revealed no acute intracranial normalities. Brain MRI revealed, -1. No acute intracranial  abnormality. 2. Evidence of moderate for age chronic small vessel disease, including several chronic micro-hemorrhages in the bilateral corona radiata and the left caudate.  The patient was given 1 mg of IV Ativan and 125 mL per hour of IV normal saline.  He will be admitted to a medical monitored bed for further evaluation and management PAST MEDICAL HISTORY:   Past Medical History:  Diagnosis Date  . DKA (diabetic ketoacidoses) 04/06/2016  . Hypercholesteremia   . Hypertension     PAST SURGICAL HISTORY:   Past Surgical History:  Procedure Laterality Date  . COLONOSCOPY WITH PROPOFOL N/A 02/01/2020   Procedure: COLONOSCOPY WITH PROPOFOL;  Surgeon: Lin Landsman, MD;  Location[facility] ENDOSCOPY;  Service: Gastroenterology;  Laterality: N/A;  . ESOPHAGOGASTRODUODENOSCOPY  02/01/2020   Procedure: ESOPHAGOGASTRODUODENOSCOPY (EGD);  Surgeon: Lin Landsman, MD;  Location: [facility] ENDOSCOPY;  Service: Gastroenterology;;  . KNEE SURGERY Right    Torn meniscus  . KNEE SURGERY Left     SOCIAL HISTORY:   Social History   Tobacco Use  . Smoking status: Never  . Smokeless tobacco: Never  Substance Use Topics  . Alcohol use: Yes    Alcohol/week: 0.0 - 1.0 standard drinks    Comment: "once every 2 months"    FAMILY HISTORY:   Family History  Problem Relation Age of Onset  . Heart attack Father   . Hypertension Sister   . Cancer Sister     DRUG ALLERGIES:  Allergies  Allergen Reactions  . Penicillins Other (See Comments)    Has patient had a PCN reaction causing immediate rash, facial/tongue/throat swelling, SOB or lightheadedness with hypotension: No Has patient had a PCN reaction causing severe rash involving mucus membranes or skin necrosis: No Has patient had a PCN reaction that required hospitalization No Has patient had a PCN reaction occurring within the last 10 years: No If all of the above answers are "NO", then may proceed with Cephalosporin use.      REVIEW OF SYSTEMS:   ROS As per history of present illness. All pertinent systems were reviewed above. Constitutional, HEENT, cardiovascular, respiratory, GI, GU, musculoskeletal, neuro, psychiatric, endocrine, integumentary and hematologic systems were reviewed and are otherwise negative/unremarkable except for positive findings mentioned above in the HPI.   MEDICATIONS AT HOME:   Prior to Admission medications   Medication Sig Start Date End Date Taking? Authorizing Provider  Alcohol Swabs (B-D SINGLE USE SWABS REGULAR) PADS Use to check blood sugar four times daily for type 1 diabetes 07/11/20   Birdie Sons, MD  amLODipine (NORVASC) 10 MG tablet Take 1 tablet (10 mg total) by mouth daily. 06/25/20   Birdie Sons, MD  aspirin 81 MG chewable tablet Chew 1 tablet by mouth daily.    [provider]  Blood Glucose Calibration (TRUE METRIX LEVEL 1) Low SOLN Use to check blood sugar four times daily for type 1 diabetes 07/11/20   Birdie Sons, MD  gabapentin (NEURONTIN) 300 MG capsule Take 1 capsule by mouth 2 (two) times daily.    [provider]  insulin aspart (NOVOLOG) 100 UNIT/ML injection Inject up to 14 units three times daily before meals according to sliding scale 06/25/20   Birdie Sons, MD  insulin degludec (TRESIBA FLEXTOUCH) 100 UNIT/ML FlexTouch Pen Reduce to 10 units daily (down from 30) due to normal blood sugar in the hospital without this insulin. 08/02/20   Enzo Bi, MD  Insulin Syringe-Needle U-100 (INSULIN SYRINGE 1CC/31GX5/16") 31G X 5/16" 1 ML MISC For insulin injections up to 4 times daily 07/11/20   Birdie Sons, MD  lovastatin (MEVACOR) 40 MG tablet Take 1 tablet (40 mg total) by mouth at bedtime. 08/02/18   Birdie Sons, MD  omeprazole (PRILOSEC) 40 MG capsule Take 1 capsule (40 mg total) by mouth 2 (two) times daily before a meal. Patient not taking: Reported on 08/06/2020 02/01/20 03/02/20  Lin Landsman, MD  pregabalin  (LYRICA) 75 MG capsule Take 75 mg by mouth in the morning and at bedtime. 05/31/20   [provider]      VITAL SIGNS:  Blood pressure (!) 190/95, pulse 78, temperature 98.2 F (36.8 C), temperature source Oral, resp. rate 15, height 5\' 10"  (1.778 m), weight 81.6 kg, SpO2 99 %.  PHYSICAL EXAMINATION:  Physical Exam  GENERAL:  70 y.o.-year-old African-American male patient lying in the bed with no acute distress.  EYES: Pupils equal, round, reactive to light and accommodation. No scleral icterus. Extraocular muscles intact.  HEENT: Head atraumatic, normocephalic. Oropharynx and nasopharynx clear.  NECK:  Supple, no jugular venous distention. No thyroid enlargement, no tenderness.  LUNGS: Normal breath sounds bilaterally, no wheezing, rales,rhonchi or crepitation. No use of accessory muscles of respiration.  CARDIOVASCULAR: Regular rate and rhythm, S1, S2 normal. No murmurs, rubs, or gallops.  ABDOMEN: Soft, nondistended, nontender. Bowel sounds present. No organomegaly or mass.  EXTREMITIES: No pedal edema, cyanosis, or clubbing.  NEUROLOGIC: Cranial nerves II through  XII are intact. Muscle strength 5/5 in all extremities. Sensation intact. Gait not checked.  He was fairly somnolent but arousable. PSYCHIATRIC: The patient is alert and oriented x 3.  Normal affect and good eye contact. SKIN: No obvious rash, lesion, or ulcer.   LABORATORY PANEL:   CBC Recent Labs  Lab 09/12/20 0521  WBC 2.8*  HGB 10.7*  HCT 32.8*  PLT 209    ------------------------------------------------------------------------------------------------------------------  Chemistries  Recent Labs  Lab 09/11/20 2015 09/12/20 0521  NA  --  135  K  --  3.7  CL  --  105  CO2  --  24  GLUCOSE  --  55*  BUN  --  32*  CREATININE  --  1.47*  CALCIUM  --  8.2*  MG 2.0  --   AST 22  --   ALT 15  --   ALKPHOS 37*  --   BILITOT 0.7  --      ------------------------------------------------------------------------------------------------------------------  Cardiac Enzymes No results for input(s): TROPONINI in the last 168 hours. ------------------------------------------------------------------------------------------------------------------  RADIOLOGY:  CT Head Wo Contrast  Result Date: 09/12/2020 CLINICAL DATA:  Acute neurological deficit, suspecting stroke. Hyperglycemia and tremors. EXAM: CT HEAD WITHOUT CONTRAST TECHNIQUE: Contiguous axial images were obtained from the base of the skull through the vertex without intravenous contrast. COMPARISON:  07/29/2020 FINDINGS: Brain: Motion artifact limits examination even with repeat imaging. No evidence of mass effect or midline shift. No abnormal extra-axial fluid collections. Gray-white matter junctions are distinct. Basal cisterns are not effaced. Multiple calcifications in the falx and tentorium, unchanged. No acute intracranial hemorrhage. Vascular: No hyperdense vessel or unexpected calcification. Skull: Calvarium appears intact. Sinuses/Orbits: Opacification of some of the ethmoid air cells. Paranasal sinuses and mastoid air cells are otherwise clear. Other: No significant changes since prior study. IMPRESSION: No acute intracranial abnormalities. Electronically Signed   By: Lucienne Capers M.D.   On: 09/12/2020 01:08   MR BRAIN WO CONTRAST  Result Date: 09/12/2020 CLINICAL DATA:  70 year old male with altered mental status. Hyperglycemia. Tremors. EXAM: MRI HEAD WITHOUT CONTRAST TECHNIQUE: Multiplanar, multiecho pulse sequences of the brain and surrounding structures were obtained without intravenous contrast. COMPARISON:  Head CT 0053 hours today, and earlier. FINDINGS: Brain: No restricted diffusion to suggest acute infarction. No midline shift, mass effect, evidence of mass lesion, ventriculomegaly, extra-axial collection or acute intracranial hemorrhage. Cervicomedullary  junction and pituitary are within normal limits. Patchy mostly periventricular white matter T2 and FLAIR hyperintensity is mild to moderate for age. Occasional scattered subcortical white matter involvement. Superimposed several chronic microhemorrhages in the bilateral corona radiata and left caudate (series 13, image 35). But no cortical encephalomalacia or other chronic cerebral blood products identified. The other deep gray matter nuclei, the brainstem and cerebellum appear negative. Vascular: Major intracranial vascular flow voids are preserved. Skull and upper cervical spine: Negative. Visualized bone marrow signal is within normal limits. Sinuses/Orbits: Negative orbits. Trace paranasal sinus mucosal thickening. Dental hardware susceptibility artifact. Other: Mastoids are clear. Grossly normal visible internal auditory structures. Visible scalp and face appear negative. IMPRESSION: 1. No acute intracranial abnormality. 2. Evidence of moderate for age chronic small vessel disease, including several chronic micro-hemorrhages in the bilateral corona radiata and the left caudate. Electronically Signed   By: Genevie Ann M.D.   On: 09/12/2020 05:28      IMPRESSION AND PLAN:  Active Problems:   Muscle twitching  1.  Diffuse right-sided muscle twitching initially with hyperglycemia and later with hypoglycemia with type 1 diabetes  mellitus.  We will need to rule out CVA and seizures - The patient was seen by teleneurology and the thought was that this is likely metabolic. - The patient's [name] will be held off. - The patient will be placed on hypoglycemia protocol. - We will place him on supplement coverage with NovoLog. - We will obtain an EEG and brain MRI without contrast. - Neurochecks will be followed. - Neurology consult to be obtained. - I notified Dr. Curly Shores about the patient  2.  Acute kidney injury superimposed on stage IIIa chronic kidney disease. - The patient will be hydrated with IV  normal saline and will follow BMP. - We will avoid nephrotoxins.  3.  Mild hyperkalemia. - We will monitor potassium with hydration.  4.  Essential hypertension. - We will continue Norvasc.  5.  Dyslipidemia. - We will continue statin therapy.  6.  GERD. - We will continue PPI therapy.  7.  Peripheral neuropathy with diabetes mellitus type 1. - We will continue Lyrica.     DVT prophylaxis: Lovenox.  Code Status: full code.  Family Communication:  The plan of care was discussed in details with the patient (and family). I answered all questions. The patient agreed to proceed with the above mentioned plan. Further management will depend upon hospital course. Disposition Plan: Back to previous home environment Consults called: Neurology. All the records are reviewed and case discussed with ED provider.  Status is: Inpatient  Remains inpatient appropriate because:Altered mental status, Ongoing diagnostic testing needed not appropriate for outpatient work up, Unsafe d/c plan, IV treatments appropriate due to intensity of illness or inability to take PO, and Inpatient level of care appropriate due to severity of illness  Dispo: The patient is from: Home              Anticipated d/c is to: Home              Patient currently is not medically stable to d/c.   Difficult to place patient No    TOTAL TIME TAKING CARE OF THIS PATIENT: 55 minutes.    Christel Mormon M.D on 09/12/2020 at 7:12 AM  [facility]   From 7 PM-7 AM, contact night-coverage [email or web address]  CC: Primary care physician; Birdie Sons, MD

## 2020-09-12 NOTE — ED Notes (Signed)
Pt given several warm blankets. Rectal temp 96.2. MD notified.

## 2020-09-12 NOTE — Progress Notes (Signed)
No charge progress note.  Nicholas Weiss is a 70 y.o. male with medical history significant for type 1 diabetes mellitus, hypertension and dyslipidemia, who presented to the emergency room with acute onset of diffuse right-sided muscle twitches that started around noon yesterday.  He described it he as "jumping" that she has not experienced before.  She denies any paresthesias or focal muscle weakness, headache or dizziness or blurred vision, dysphagia or dysarthria.  No fever or chills.  No dyspnea or cough or wheezing or hemoptysis.  No chest pain or palpitations.  Patient has an history of smoldering multiple myeloma for which he is under a treatment of hematologist.  Chronic leukopenia.  Rest of the labs are unremarkable. CT head and MRI without any acute abnormality.  Neurology was consulted and patient underwent EEG which was within normal limit.  Patient did had an episode of hypoglycemia where the sugar dropped to 55 requiring intervention. Remained asymptomatic.  Patient is on higher dose of Tresiba at home and according to him he does develop hypoglycemia at times. -We will decrease the home dose of Tresiba on discharge. -Continue with SSI  Awaiting final neurology recommendations.  Patient was seen and examined today.  No complaints.  No more twitching. Had benign exam.  Continue with current management and follow-up neurology recommendations

## 2020-09-12 NOTE — ED Notes (Signed)
MD amin notified about pt's blood pressure.

## 2020-09-12 NOTE — ED Notes (Signed)
Patient returns from CT.  Blankets on patient to help increase warmth.

## 2020-09-12 NOTE — ED Notes (Signed)
Ammonia inhalant used so MRI could arouse pt to answer questions. Order per Ward, DO

## 2020-09-12 NOTE — ED Notes (Signed)
Tele neuro placed at bedside.

## 2020-09-12 NOTE — Discharge Instructions (Signed)
Please call Dr. Mee Hives (Endocrinologist-Diabetes doctor) to get an appointment so you can get re-established with him. His office number is [phone].  Please be sure your primary care doctor (Dr. Caryn Section) is aware that you are having low blood glucose every day.  Be sure to pay attention to discharge orders to see if your insulin regimen changes and then follow up with Dr. Caryn Section.

## 2020-09-12 NOTE — Consult Note (Signed)
[facility] TeleNeurology Consult Services  Stat Consult  Date of Service:   09/12/2020 01:26:25  Diagnosis:       R25.1 - Tremor  Impression: 70 yr old man, hx of HTN, lipids, DM, chronic kidney disease, who comes to ER for tremors and twitching in all 4 extremities, and head tremor started at noon on 6/15 per patient. He denied hx of seizures, no recent change in medications, no new medical hx. He denied fever, but he is cold and requires several blankets in ER. He denied neurological hx. He has had diplopia, for last 2 weeks. He admits to some confusion in the last few days.  CT head calcifications, no acute changes. Exam non focal, he has action tremor, asterixis on exam.   Diff Dx: r/o CVA causing ballismus, seizure, metabolic abnormality causing asterixis, other. Out of thrombolytic window and clinically not LVO.   Rec:   - check Ammonia, TSH, levels  - MRI Brain  - EEG  - on ASA  - further work up and recs depending on clinical course and results.   d/w pt.  D/w ER Dr Leonides Schanz the above recs.  CT HEAD: Showed No Acute Hemorrhage or Acute Core Infarct Reviewed calcifications, no acute changes.  Our recommendations are outlined below.  Diagnostic Studies: MRI brain without contrast Routine EEG  Nursing Recommendations: Maintain Euglycemia and Euthermia  DVT Prophylaxis: Choice of Primary Team  Disposition: Neurology will follow   Metrics: [facility] Notification Time: 09/12/2020 01:22:14 Stamp Time: 09/12/2020 01:26:25 Callback Response Time: 09/12/2020 01:27:44   ----------------------------------------------------------------------------------------------------  Chief Complaint: tremors  History of Present Illness: Patient is a 70 year old Male.  70 yr old man, hx of HTN, lipids, DM, chronic kidney disease, who comes to ER for tremors and twitching in all 4 extremities, and head tremor started at noon on 6/15 per patient. He  denied hx of seizures, no recent change in medications, no new medical hx. He denied fever, but he is cold and requires several blankets in ER. He denied neurological hx. He has had diplopia, for last 2 weeks. He admits to some confusion in the last few days.   Past Medical History:      Hypertension      Diabetes Mellitus      Hyperlipidemia  Anticoagulant use:  No  Antiplatelet use: Yes asa   Examination: BP(147/97), Pulse(76), Blood Glucose(114)  Neuro Exam: General: Alert,Awake, Oriented to Time, Place, Person  Speech: Fluent:  Language: Intact:  Face: Symmetric:  Facial Sensation: Intact:  Visual Fields: Intact:  Extraocular Movements: Intact:  Motor Exam: No Drift:  Sensation: Intact:  Coordination: Intact:  he has action tremor, asterixis on exam.     Patient / Family was informed the Neurology Consult would occur via TeleHealth consult by way of interactive audio and video telecommunications and consented to receiving care in this manner.  Patient is being evaluated for possible acute neurologic impairment and high probability of imminent or life - threatening deterioration.I spent total of 35 minutes providing care to this patient, including time for face to face visit via telemedicine, review of medical records, imaging studies and discussion of findings with providers, the patient and / or family.   Dr Lloyd Huger   [facility] [phone]  Case [id number]

## 2020-09-12 NOTE — TOC Progression Note (Signed)
Transition of Care [facility]) - Progression Note    Patient Details  Name: Nicholas Weiss MRN: [id number] Date of Birth: Jan 10, 1951  Transition of Care [facility]) CM/SW [place], RN Phone Number: 09/12/2020, 4:46 PM  Clinical Narrative:   Patient lives at home with wife and children.  Wife can assist patient if needed.  No concerns getting to appointments or getting medications.  Patient can take medications as directed.    Patient currently has no home health services or DME.  Denies any concerns for TOC at this time. TOC contact information given, will monitor if needs arise during stay.    Expected Discharge Plan: Home/Self Care Barriers to Discharge: Continued Medical Work up  Expected Discharge Plan and Services Expected Discharge Plan: Home/Self Care   Discharge Planning Services: CM Consult   Living arrangements for the past 2 months: Single Family Home                                       Social Determinants of Health (SDOH) Interventions    Readmission Risk Interventions No flowsheet data found.

## 2020-09-12 NOTE — ED Notes (Signed)
Patient advises he smoked marijuana earlier this week.  States "I've been smoking pot for my whole life."

## 2020-09-12 NOTE — ED Notes (Signed)
Pt provided with graham crackers

## 2020-09-12 NOTE — ED Notes (Signed)
Teleneuro on screen at this time.

## 2020-09-12 NOTE — ED Provider Notes (Signed)
[facility]: [facility] Emergency Department Provider Note  ____________________________________________   Event Date/Time   First MD Initiated Contact with Patient 09/12/20 0018     (approximate)  I have reviewed the triage vital signs and the nursing notes.   HISTORY  Chief Complaint Tremors and Hyperglycemia    HPI JAME MORRELL is a 70 y.o. male history of hypertension, hyperlipidemia, diabetes, chronic kidney disease who presents to the emergency department with diffuse twitches that started around noon today.  States he is just "jumping".  He has never had similar symptoms.  He denies fevers, cough, chest pain, shortness of breath, numbness, tingling, weakness.  No medication changes.  No drug or alcohol use.  Seen at urgent care today and sent to the emergency department for further evaluation.        Past Medical History:  Diagnosis Date   DKA (diabetic ketoacidoses) 04/06/2016   Hypercholesteremia    Hypertension     Patient Active Problem List   Diagnosis Date Noted   Muscle twitching 09/12/2020   Smoldering multiple myeloma ([place]) 08/02/2020   Pre-syncope 08/01/2020   Heart block AV first degree 08/01/2020   Anemia of chronic kidney failure, stage 3 (moderate) ([place]) 05/28/2020   PUD (peptic ulcer disease)    Esophageal dysphagia    Encounter for screening colonoscopy    Hyperglycemia due to type 2 diabetes mellitus ([place]) 07/28/2019   Generalized weakness 07/28/2019   Type 2 diabetes mellitus with hyperglycemia ([place]) 07/28/2019   Hypoglycemia 04/27/2019   Unresponsiveness 04/27/2019   Lung nodule 04/07/2019   Uncontrolled type 1 diabetes mellitus ([place])    Acute renal failure with acute tubular necrosis superimposed on stage 3a chronic kidney disease ([place])    Hypertensive urgency 04/05/2019   AKI (acute kidney injury) ([place]) 04/05/2019   CKD (chronic kidney disease) stage 3, GFR 30-59 ml/min ([place]) 04/05/2019   DKA (diabetic ketoacidoses)  04/06/2016   Hypercholesterolemia 01/24/2016   Essential (primary) hypertension 06/07/2006   Type 1 diabetes mellitus with diabetic neuropathy, unspecified ([place]) 05/31/2006    Past Surgical History:  Procedure Laterality Date   COLONOSCOPY WITH PROPOFOL N/A 02/01/2020   Procedure: COLONOSCOPY WITH PROPOFOL;  Surgeon: Lin Landsman, MD;  Location: [facility] ENDOSCOPY;  Service: Gastroenterology;  Laterality: N/A;   ESOPHAGOGASTRODUODENOSCOPY  02/01/2020   Procedure: ESOPHAGOGASTRODUODENOSCOPY (EGD);  Surgeon: Lin Landsman, MD;  Location: [facility] ENDOSCOPY;  Service: Gastroenterology;;   KNEE SURGERY Right    Torn meniscus   KNEE SURGERY Left     Prior to Admission medications   Medication Sig Start Date End Date Taking? Authorizing Provider  Alcohol Swabs (B-D SINGLE USE SWABS REGULAR) PADS Use to check blood sugar four times daily for type 1 diabetes 07/11/20   Birdie Sons, MD  amLODipine (NORVASC) 10 MG tablet Take 1 tablet (10 mg total) by mouth daily. 06/25/20   Birdie Sons, MD  aspirin 81 MG chewable tablet Chew 1 tablet by mouth daily.    [provider]  Blood Glucose Calibration (TRUE METRIX LEVEL 1) Low SOLN Use to check blood sugar four times daily for type 1 diabetes 07/11/20   Birdie Sons, MD  gabapentin (NEURONTIN) 300 MG capsule Take 1 capsule by mouth 2 (two) times daily.    [provider]  insulin aspart (NOVOLOG) 100 UNIT/ML injection Inject up to 14 units three times daily before meals according to sliding scale 06/25/20   Birdie Sons, MD  insulin degludec (TRESIBA FLEXTOUCH) 100 UNIT/ML  FlexTouch Pen Reduce to 10 units daily (down from 30) due to normal blood sugar in the hospital without this insulin. 08/02/20   Enzo Bi, MD  Insulin Syringe-Needle U-100 (INSULIN SYRINGE 1CC/31GX5/16") 31G X 5/16" 1 ML MISC For insulin injections up to 4 times daily 07/11/20   Birdie Sons, MD  lovastatin (MEVACOR) 40 MG tablet Take 1 tablet (40 mg  total) by mouth at bedtime. 08/02/18   Birdie Sons, MD  omeprazole (PRILOSEC) 40 MG capsule Take 1 capsule (40 mg total) by mouth 2 (two) times daily before a meal. Patient not taking: Reported on 08/06/2020 02/01/20 03/02/20  Lin Landsman, MD    Allergies Penicillins  Family History  Problem Relation Age of Onset   Heart attack Father    Hypertension Sister    Cancer Sister     Social History Social History   Tobacco Use   Smoking status: Never   Smokeless tobacco: Never  Vaping Use   Vaping Use: Never used  Substance Use Topics   Alcohol use: Yes    Alcohol/week: 0.0 - 1.0 standard drinks    Comment: "once every 2 months"   Drug use: Not Currently    Types: Marijuana    Comment: "none in a long time'    Review of Systems Constitutional: No fever. Eyes: No visual changes. ENT: No sore throat. Cardiovascular: Denies chest pain. Respiratory: Denies shortness of breath. Gastrointestinal: No nausea, vomiting, diarrhea. Genitourinary: Negative for dysuria. Musculoskeletal: Negative for back pain. Skin: Negative for rash. Neurological: Negative for focal weakness or numbness.  + Muscle twitching.  ____________________________________________   PHYSICAL EXAM:  VITAL SIGNS: ED Triage Vitals [09/11/20 2009]  Enc Vitals Group     BP (!) 147/97     Pulse Rate 76     Resp 18     Temp 99.2 F (37.3 C)     Temp Source Oral     SpO2 99 %     Weight 180 lb (81.6 kg)     Height _0  (1.778 m)     Head Circumference      Peak Flow      Pain Score 0     Pain Loc      Pain Edu?      Excl. in [place]?    CONSTITUTIONAL: Alert and oriented and responds appropriately to questions. Well-appearing; well-nourished HEAD: Normocephalic EYES: Conjunctivae clear, pupils appear equal, EOM appear intact ENT: normal nose; moist mucous membranes NECK: Supple, normal ROM CARD: RRR; S1 and S2 appreciated; no murmurs, no clicks, no rubs, no gallops RESP: Normal chest  excursion without splinting or tachypnea; breath sounds clear and equal bilaterally; no wheezes, no rhonchi, no rales, no hypoxia or respiratory distress, speaking full sentences ABD/GI: Normal bowel sounds; non-distended; soft, non-tender, no rebound, no guarding, no peritoneal signs, no hepatosplenomegaly BACK: The back appears normal EXT: Normal ROM in all joints; no deformity noted, no edema; no cyanosis SKIN: Normal color for age and race; warm; no rash on exposed skin NEURO: Moves all extremities equally, strength 5/5 in all 4 extremities, cranial nerves II through XII intact, normal speech, intermittent muscle twitching noted to his face and upper and lower extremities, no asterixis, no clonus PSYCH: The patient's mood and manner are appropriate.  ____________________________________________   LABS (all labs ordered are listed, but only abnormal results are displayed)  Labs Reviewed  BASIC METABOLIC PANEL - Abnormal; Notable for the following components:      Result Value  Sodium 130 (*)    Glucose, Bld 284 (*)    BUN 38 (*)    Creatinine, Ser 1.84 (*)    Calcium 8.0 (*)    GFR, Estimated 40 (*)    Anion gap 1 (*)    All other components within normal limits  CBC - Abnormal; Notable for the following components:   WBC 2.9 (*)    RBC 3.38 (*)    Hemoglobin 10.3 (*)    HCT 31.3 (*)    All other components within normal limits  URINALYSIS, COMPLETE (UACMP) WITH MICROSCOPIC - Abnormal; Notable for the following components:   Color, Urine STRAW (*)    APPearance CLEAR (*)    Glucose, UA >=500 (*)    Hgb urine dipstick SMALL (*)    All other components within normal limits  HEPATIC FUNCTION PANEL - Abnormal; Notable for the following components:   Total Protein 8.4 (*)    Albumin 3.0 (*)    Alkaline Phosphatase 37 (*)    All other components within normal limits  CBG MONITORING, ED - Abnormal; Notable for the following components:   Glucose-Capillary 114 (*)    All other  components within normal limits  RESP PANEL BY RT-PCR (FLU A&B, COVID) ARPGX2  MAGNESIUM  CK  AMMONIA  TSH  CBG MONITORING, ED   ____________________________________________  EKG   EKG Interpretation  Date/Time:  Wednesday September 11 2020 20:21:25 EDT Ventricular Rate:  75 PR Interval:  376 QRS Duration: 90 QT Interval:  394 QTC Calculation: 439 R Axis:   87 Text Interpretation: Sinus rhythm with 1st degree A-V block Otherwise normal ECG Confirmed by Pryor Curia [phone]) on 09/12/2020 12:39:00 AM         ____________________________________________  RADIOLOGY Jessie Foot Chrishaun Sasso, personally viewed and evaluated these images (plain radiographs) as part of my medical decision making, as well as reviewing the written report by the radiologist.  ED MD interpretation: CT head shows no acute abnormality.  Official radiology report(s): CT Head Wo Contrast  Result Date: 09/12/2020 CLINICAL DATA:  Acute neurological deficit, suspecting stroke. Hyperglycemia and tremors. EXAM: CT HEAD WITHOUT CONTRAST TECHNIQUE: Contiguous axial images were obtained from the base of the skull through the vertex without intravenous contrast. COMPARISON:  07/29/2020 FINDINGS: Brain: Motion artifact limits examination even with repeat imaging. No evidence of mass effect or midline shift. No abnormal extra-axial fluid collections. Gray-white matter junctions are distinct. Basal cisterns are not effaced. Multiple calcifications in the falx and tentorium, unchanged. No acute intracranial hemorrhage. Vascular: No hyperdense vessel or unexpected calcification. Skull: Calvarium appears intact. Sinuses/Orbits: Opacification of some of the ethmoid air cells. Paranasal sinuses and mastoid air cells are otherwise clear. Other: No significant changes since prior study. IMPRESSION: No acute intracranial abnormalities. Electronically Signed   By: Lucienne Capers M.D.   On: 09/12/2020 01:08     ____________________________________________   PROCEDURES  Procedure(s) performed (including Critical Care):  Procedures   ____________________________________________   INITIAL IMPRESSION / ASSESSMENT AND PLAN / ED COURSE  As part of my medical decision making, I reviewed the following data within the [place] notes reviewed and incorporated, Labs reviewed , EKG interpreted , Old chart reviewed, Radiograph reviewed , Discussed with admitting physician , A consult was requested and obtained from this/these consultant(s) Neurology, and Notes from prior ED visits         Patient here with intermittent muscle twitching.  Initial labs show potassium of 5.1, calcium of 8.3 which  appears to be his baseline.  We will add on magnesium level and check his albumin.  He is not in DKA.  Blood glucose normal at this time.  This does not look like rigors but will check rectal temp given oral temp of 99.2 here.  Does not appear to be seizure-like activity.  He has no obvious focal neurologic deficits but did complain of intermittent headaches, confusion with urgent care.  Will obtain CT of the head.  No history of any neurologic conditions, Parkinson's.  This does not appear to be tremors.  ED PROGRESS  Patient continues to have intermittent twitching.  Work-up reveals normal electrolytes, CK and normal head CT.  Will discuss with telemetry neurology for consultation.  2:00 AM  Patient evaluated by telemetry neurology.  They suspect that this could be metabolic in nature and recommend obtaining ammonia level, TSH.  Also recommend MRI of the brain without contrast to rule out stroke and admission for EEG to rule out seizure.  Discussed with patient who agrees on admission.  Will give IV Ativan prior to MRI as I am aware that he will not be able to stay still due to this twitching.  2:28 AM Discussed patient's case with hospitalist, Dr. Sidney Ace.  I have recommended admission  and patient (and family if present) agree with this plan. Admitting physician will place admission orders.   I reviewed all nursing notes, vitals, pertinent previous records and reviewed/interpreted all EKGs, lab and urine results, imaging (as available).  ____________________________________________   FINAL CLINICAL IMPRESSION(S) / ED DIAGNOSES  Final diagnoses:  Muscle twitching     ED Discharge Orders     None       *Please note:  Nicholas Weiss was evaluated in Emergency Department on 09/12/2020 for the symptoms described in the history of present illness. He was evaluated in the context of the global COVID-19 pandemic, which necessitated consideration that the patient might be at risk for infection with the SARS-CoV-2 virus that causes COVID-19. Institutional protocols and algorithms that pertain to the evaluation of patients at risk for COVID-19 are in a state of rapid change based on information released by regulatory bodies including the CDC and federal and state organizations. These policies and algorithms were followed during the patient's care in the ED.  Some ED evaluations and interventions may be delayed as a result of limited staffing during and the pandemic.*   Note:  This document was prepared using Dragon voice recognition software and may include unintentional dictation errors.    Ehab Humber, Delice Bison, DO 09/12/20 [phone]

## 2020-09-12 NOTE — ED Notes (Signed)
cbg 114

## 2020-09-12 NOTE — ED Notes (Signed)
Patient transported to CT 

## 2020-09-12 NOTE — Consult Note (Signed)
Neurology Consultation Reason for Consult: Muscle twitching, c/f seizure Requesting Physician: Eugenie Norrie  CC: "Jumping movements"  History is obtained from: patient and chart review   HPI: Nicholas Weiss is a 70 y.o. male with a past medical history significant for substance use (chronic cocaine use and marijuana), diabetes (A1c 8.8% 06/14/2020), hypertension, hyperlipidemia, hypertension, chronic neutropenia/leukopenia (thought to be secondary to cocaine use), chronic kidney disease C/B anemia, smoldering multiple myeloma (PET/CT without lesions), first-degree heart block,.    He reports that he last used cocaine on Monday evening (6/13), using marijuana at the same time.  He was up about 3 hours after using and then went to bed without any incident at the time.  When he woke up on Tuesday he was having increased jumping movements.  He did have an episode of loss of consciousness Tuesday when he stood up which was not associated with any preceding lightheadedness or diaphoresis but which he did not make too much of the time; he did not have any confusion on regaining consciousness and lost consciousness only briefly.  However his movements seemed worse on Wednesday prompting his presentation for further evaluation.  Since then they have nearly resolved although he still has some mild intermittent jerking.  He notes that the movements were better with rest and worse with action, for example when he would try to bring a glass to his lips the water would get thrown out of the glass but if he was resting his arm in his lap he could hold a glass steadily without swinging at.  He reports he has never had jerking this severe before although he has had smaller level of jerking that he has currently intermittently for at least a few months.  He has not been able to identify a clear trigger.  Note he was at times inconsistent in his history, sometimes reporting that the movements do not start until Wednesday but  then reporting that his movements actually started on Tuesday.  Regarding his cocaine use, he reports that this is purely recreational, and he has stopped at times but tends to use again when he is out with certain friends.  He is concerned that this is contributing to his movements and is motivated to avoid it in the future, noting he will hang out with friends who do not use and reports he does not need any other assistance and ceasing use at this time.  He was most recently admitted from 5/5-5/6 for near syncope, found to have an AKI (creatinine improved from 2.65 on presentation to 1.48 with IV hydration).  On review of systems he additionally notes he has been gaining some weight and he has urinary urgency when his sugars are high, but denies any bowel or bladder incontinence, any tongue bites, any confusional episodes.  He has been having some double vision which is improved with the use of prism glasses although he notes that currently he feels the glasses are overcorrected and needs some further adjustment   ROS: All other review of systems was negative except as noted in the HPI.   Past Medical History:  Diagnosis Date   DKA (diabetic ketoacidoses) 04/06/2016   Hypercholesteremia    Hypertension    Past Surgical History:  Procedure Laterality Date   COLONOSCOPY WITH PROPOFOL N/A 02/01/2020   Procedure: COLONOSCOPY WITH PROPOFOL;  Surgeon: Lin Landsman, MD;  Location: [facility] ENDOSCOPY;  Service: Gastroenterology;  Laterality: N/A;   ESOPHAGOGASTRODUODENOSCOPY  02/01/2020   Procedure: ESOPHAGOGASTRODUODENOSCOPY (EGD);  Surgeon: Lin Landsman, MD;  Location: [facility] ENDOSCOPY;  Service: Gastroenterology;;   KNEE SURGERY Right    Torn meniscus   KNEE SURGERY Left    Current Outpatient Medications  Medication Instructions   Alcohol Swabs (B-D SINGLE USE SWABS REGULAR) PADS Use to check blood sugar four times daily for type 1 diabetes   amLODipine (NORVASC) 10 mg, Oral, Daily    aspirin 81 MG chewable tablet 1 tablet, Oral, Daily   Blood Glucose Calibration (TRUE METRIX LEVEL 1) Low SOLN Use to check blood sugar four times daily for type 1 diabetes   gabapentin (NEURONTIN) 300 MG capsule 1 capsule, Oral, 2 times daily   insulin aspart (NOVOLOG) 100 UNIT/ML injection Inject up to 14 units three times daily before meals according to sliding scale   insulin degludec (TRESIBA FLEXTOUCH) 100 UNIT/ML FlexTouch Pen Reduce to 10 units daily (down from 30) due to normal blood sugar in the hospital without this insulin.   Insulin Syringe-Needle U-100 (INSULIN SYRINGE 1CC/31GX5/16") 31G X 5/16" 1 ML MISC For insulin injections up to 4 times daily   lovastatin (MEVACOR) 40 mg, Oral, Daily at bedtime   omeprazole (PRILOSEC) 40 mg, Oral, 2 times daily before meals   pregabalin (LYRICA) 75 mg, Oral, 2 times daily    Family History  Problem Relation Age of Onset   Heart attack Father    Hypertension Sister    Cancer Sister     Social History:  reports that he has never smoked. He has never used smokeless tobacco. He reports current alcohol use. He reports previous drug use. Drug: Marijuana.  He additionally endorses cocaine use    Exam: Current vital signs: BP (!) 170/121   Pulse 73   Temp 98.2 F (36.8 C) (Oral)   Resp 16   Ht 5' 10"  (1.778 m)   Wt 81.6 kg   SpO2 100%   BMI 25.83 kg/m  Vital signs in last 24 hours: Temp:  [96.2 F (35.7 C)-99.2 F (37.3 C)] 98.2 F (36.8 C) (06/16 0718) Pulse Rate:  [70-78] 73 (06/16 0800) Resp:  [10-18] 16 (06/16 0718) BP: (147-200)/(93-121) 170/121 (06/16 0800) SpO2:  [98 %-100 %] 100 % (06/16 0800) Weight:  [81.6 kg] 81.6 kg (06/15 2009)   Physical Exam  Constitutional: Appears well-developed and well-nourished.  Psych: Affect appropriate to situation, calm and cooperative Eyes: No scleral injection HENT: No oropharyngeal obstruction.  MSK: no joint deformities.  Cardiovascular: Normal rate and regular rhythm.   Respiratory: Effort normal, non-labored breathing GI: Soft.  No distension. There is no tenderness.  Skin: Warm dry and intact visible skin  Neuro: Mental Status: Patient is awake, alert, oriented to person, place, month, year, and situation. Patient is able to give a clear and coherent history, other than some minor inconsistencies in timeline as documented in HPI above. No signs of aphasia or neglect Cranial Nerves: II: Visual Fields are full. Pupils are equal, round, and reactive to light III,IV, VI: EOMI without ptosis, though there is intermittent involuntary blinking movement and with his prismatic lenses He reports worsening double vision to right gaze, improved, V: Facial sensation is symmetric to light touch VII: Facial movement is symmetric.  VIII: hearing is intact to voice X: Uvula elevates symmetrically XI: Shoulder shrug is symmetric. XII: tongue is midline without atrophy or fasciculations.  Motor: Tone is normal. Bulk is normal. 5/5 strength was present in all four extremities.  He did have 1 or 2 brief small myoclonic jerks during my  evaluation, which he notes is similar in character to the movements he came in concerned about, but much less severe in amplitude/frequency Sensory: Sensation is symmetric to light touch and temperature in the arms and legs.  There is a length dependent loss of temperature sensation in the arms and legs Deep Tendon Reflexes: 2+ and symmetric in the biceps and patellae.  Cerebellar: FNF and HKS are intact bilaterally    I have reviewed labs in epic and the results pertinent to this consultation are: Labs notable for creatinine essentially at baseline (1.84), BUN 38, glucose initially 284 with a low of 55 (improving to the mid 100s with IV dextrose), CK1 77 CBC with stable leukopenia (2.9), anemia (hemoglobin 10.3) UA with small hemoglobin as well as marked glucosuria, UDS pending Ammonia 23 TSH 1.7   I have reviewed the images  obtained: MRI brain personally reviewed, there is some evidence of chronic microvascular changes and minor chronic hemorrhages likely hypertensive/cocaine related in the bilateral white matter, with the largest in the left caudate.  No acute intracranial abnormalities  EEG within normal limits   Impression: Given the description and time course of movements the patient provides, as well as myoclonic jerks witnessed on my evaluation, I agree with the teleneurology evaluation that these movements represent a toxic/metabolic myoclonus.  EEG is reassuring and movements are improving.  I spent a significant amount of time counseling patient on potential role of substance use and triggering these movements and he expressed significant interest and intent to stop  Recommendations: -Continued substance use counseling -No further inpatient neurological work-up indicated   Lesleigh Noe MD-PhD [facility] [phone] [facility] coverage for [facility] is from 8 AM to 4 AM in-house and 4 PM to 8 PM by telephone/video. 8 PM to 8 AM emergent questions or overnight urgent questions should be addressed to Teleneurology On-call or [name] neurohospitalist; contact information can be found on AMION  Greater than 80 minutes were spent in direct care of this patient today, over which 50% was in direct patient contact performing examination, collecting history, and counseling patient on results and plan as documented above.

## 2020-09-12 NOTE — ED Notes (Signed)
ED Provider at bedside. 

## 2020-09-12 NOTE — Progress Notes (Signed)
Eeg done 

## 2020-09-12 NOTE — ED Notes (Signed)
Patient transported to MRI 

## 2020-09-12 NOTE — ED Notes (Addendum)
Pt found on ground after hearing a "thud" and finding pt on the ground. Pt had 1mg  ativan administered at 0338 just prior to MRI. Pt was placed in bed with bilateral rails raised prior to fall. Pt was wearing shoes. Wires were appropriately organized. VS updated and consistent with prior vitals. NO distress. C/o headache. MD notified. Pt ambulated to restroom with staff and placed back in bed. Fall assessment upgraded to HIGH RISK. Fall armband placed on left wrist.

## 2020-09-12 NOTE — Procedures (Addendum)
Patient Name: [name]  MRN: [id number]  Epilepsy Attending: Lora Havens  Referring Physician/Provider: Dr Eugenie Norrie Date: 09/12/2020 Duration: 29.45 mins  Patient history: 70 yo M with diffuse right-sided muscle twitching initially with hyperglycemia and later with hypoglycemia. EEG to evaluate for seizure.  Level of alertness: Awake, asleep  AEDs during EEG study: Pregabalin  Technical aspects: This EEG study was done with scalp electrodes positioned according to the 10-20 International system of electrode placement. Electrical activity was acquired at a sampling rate of 500Hz  and reviewed with a high frequency filter of 70Hz  and a low frequency filter of 1Hz . EEG data were recorded continuously and digitally stored.   Description: The posterior dominant rhythm consists of 10 Hz activity of moderate voltage (25-35 uV) seen predominantly in posterior head regions, symmetric and reactive to eye opening and eye closing. Sleep was characterized by vertex waves, sleep spindles (12 to 14 Hz), maximal frontocentral region. Physiologic photic driving was not seen during photic stimulation. Hyperventilation was not performed.     IMPRESSION: This study is within normal limits. No seizures or epileptiform discharges were seen throughout the recording.  Nicholas Weiss Nicholas Weiss

## 2020-09-13 DIAGNOSIS — E1040 Type 1 diabetes mellitus with diabetic neuropathy, unspecified: Secondary | ICD-10-CM

## 2020-09-13 LAB — BASIC METABOLIC PANEL
Anion gap: 2 — ABNORMAL LOW (ref 5–15)
BUN: 28 mg/dL — ABNORMAL HIGH (ref 8–23)
CO2: 25 mmol/L (ref 22–32)
Calcium: 7.8 mg/dL — ABNORMAL LOW (ref 8.9–10.3)
Chloride: 108 mmol/L (ref 98–111)
Creatinine, Ser: 1.46 mg/dL — ABNORMAL HIGH (ref 0.61–1.24)
GFR, Estimated: 52 mL/min — ABNORMAL LOW (ref >60)
Glucose, Bld: 123 mg/dL — ABNORMAL HIGH (ref 70–99)
Potassium: 4 mmol/L (ref 3.5–5.1)
Sodium: 135 mmol/L (ref 135–145)

## 2020-09-13 LAB — GLUCOSE, CAPILLARY
Glucose-Capillary: 196 mg/dL — ABNORMAL HIGH (ref 70–99)
Glucose-Capillary: 204 mg/dL — ABNORMAL HIGH (ref 70–99)

## 2020-09-13 LAB — HEMOGLOBIN A1C
Hgb A1c MFr Bld: 10.8 % — ABNORMAL HIGH (ref 4.8–5.6)
Mean Plasma Glucose: 263 mg/dL

## 2020-09-13 MED ORDER — TRESIBA FLEXTOUCH 100 UNIT/ML ~~LOC~~ SOPN: PEN_INJECTOR | SUBCUTANEOUS | 3 refills | Status: DC

## 2020-09-13 NOTE — Plan of Care (Signed)

## 2020-09-13 NOTE — Progress Notes (Signed)
Inpatient Diabetes Program Recommendations  AACE/ADA: New Consensus Statement on Inpatient Glycemic Control (2015)  Target Ranges:  Prepandial:   less than 140 mg/dL      Peak postprandial:   less than 180 mg/dL (1-2 hours)      Critically ill patients:  140 - 180 mg/dL   Results for Nicholas Weiss, Nicholas Weiss (MRN [id number]) as of 09/13/2020 08:23  Ref. Range 09/12/2020 11:59 09/12/2020 17:29 09/12/2020 21:10 09/13/2020 08:12  Glucose-Capillary Latest Ref Range: 70 - 99 mg/dL 303 (H)  11 units NOVOLOG  128 (H) 190 (H)  3 units NOVOLOG  196 (H)     History: Type 1 Diabetes  Home DM Meds: Tresiba 25 units QAM        Novolog 0-10 units TID with meals (dose based on glucose per sliding scale)  Current Orders: Novolog Moderate Correction Scale/ SSI (0-15 units) TID AC + HS     MD- Note CBG 196 this AM  No basal insulin ordered at this time  Please consider starting Lantus 8 units Daily this AM (0.1 units/kg) (About 1/3 total home dose to start)     --Will follow patient during hospitalization--  Wyn Quaker RN, MSN, CDE Diabetes Coordinator Inpatient Glycemic Control Team Team Pager: [phone] (8a-5p)

## 2020-09-13 NOTE — Progress Notes (Signed)
Pt discharged home with all instructions provided and questions answered.   BP (!) 144/91 (BP Location: Right Arm)   Pulse 84   Temp 98.4 F (36.9 C)   Resp 16   Ht 5\' 10"  (1.778 m)   Wt 81.6 kg   SpO2 100%   BMI 25.83 kg/m

## 2020-09-13 NOTE — Discharge Summary (Signed)
Physician Discharge Summary  Nicholas Weiss PRF:[id number] DOB: 1951-01-23 DOA: 09/11/2020  PCP: Birdie Sons, MD  Admit date: 09/11/2020 Discharge date: 09/13/2020  Admitted From: Home Disposition: Home  Recommendations for Outpatient Follow-up:  Follow up with PCP in 1-2 weeks Follow-up with oncology according to his schedule Please obtain BMP/CBC in one week Please follow up on the following pending results: None  Home Health: No Equipment/Devices: None Discharge Condition: Stable CODE STATUS: Full Diet recommendation: Heart Healthy / Carb Modified   Brief/Interim Summary: Nicholas Weiss is a 70 y.o. male with medical history significant for type 1 diabetes mellitus, hypertension and dyslipidemia, who presented to the emergency room with acute onset of diffuse right-sided muscle twitches that started around noon yesterday.  He described it he as "jumping" that she has not experienced before.  She denies any paresthesias or focal muscle weakness, headache or dizziness or blurred vision, dysphagia or dysarthria.  No fever or chills.  No dyspnea or cough or wheezing or hemoptysis.  No chest pain or palpitations.   Patient has an history of smoldering multiple myeloma for which he is under a treatment of hematologist.  Chronic leukopenia.  Rest of the labs are unremarkable. CT head and MRI without any acute abnormality.  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  Patient was counseled extensively to avoid cocaine and other illicit drugs.   Patient did had an episode of hypoglycemia where the sugar dropped to 55 requiring intervention. Remained asymptomatic.  Patient is on higher dose of Tresiba at home and according to him he does develop hypoglycemia at times. Home dose of Tresiba was decreased from 25 to 20 mg.  Patient remained  stable and no further episode.  He he will continue with rest of his home medications and follow-up with his providers.  Discharge Diagnoses:  Active Problems:   Muscle twitching   Discharge Instructions  Discharge Instructions     Diet - low sodium heart healthy   Complete by: As directed    Discharge instructions   Complete by: As directed    It was pleasure taking care of you. We decreased the dose of Tresiba from 25 to 20 mg daily as you were experiencing some low blood sugar. Please check your blood glucose level before using NovoLog and adjust your dose accordingly to prevent any low sugars. Avoid cocaine and other illicit drugs. Continue with your rest of the medications and follow-up with your oncologist and primary care doctor.   Increase activity slowly   Complete by: As directed       Allergies as of 09/13/2020       Reactions   Penicillins Other (See Comments)   Has patient had a PCN reaction causing immediate rash, facial/tongue/throat swelling, SOB or lightheadedness with hypotension: No Has patient had a PCN reaction causing severe rash involving mucus membranes or skin necrosis: No Has patient had a PCN reaction that required hospitalization No Has patient had a PCN reaction occurring within the last 10 years: No If all of the above answers are "NO", then may proceed with Cephalosporin use.        Medication List     TAKE these medications    amLODipine 10 MG tablet Commonly known as: NORVASC Take 1 tablet (10 mg total) by mouth daily.   aspirin 81 MG chewable tablet Chew 1  tablet by mouth daily.   B-D SINGLE USE SWABS REGULAR Pads Use to check blood sugar four times daily for type 1 diabetes   insulin aspart 100 UNIT/ML injection Commonly known as: novoLOG Inject up to 14 units three times daily before meals according to sliding scale   INSULIN SYRINGE 1CC/31GX5/16" 31G X 5/16" 1 ML Misc For insulin injections up to 4 times daily    lovastatin 40 MG tablet Commonly known as: MEVACOR Take 1 tablet (40 mg total) by mouth at bedtime.   pregabalin 75 MG capsule Commonly known as: LYRICA Take 75 mg by mouth in the morning and at bedtime.   [name] FlexTouch 100 UNIT/ML FlexTouch Pen Generic drug: insulin degludec 20 units daily. What changed: additional instructions   True Metrix Level 1 Low Soln Use to check blood sugar four times daily for type 1 diabetes        Follow-up Information     Birdie Sons, MD. Schedule an appointment as soon as possible for a visit in 1 week(s).   Specialty: Family Medicine Contact information: [id number] [place] [address] [phone]                Allergies  Allergen Reactions   Penicillins Other (See Comments)    Has patient had a PCN reaction causing immediate rash, facial/tongue/throat swelling, SOB or lightheadedness with hypotension: No Has patient had a PCN reaction causing severe rash involving mucus membranes or skin necrosis: No Has patient had a PCN reaction that required hospitalization No Has patient had a PCN reaction occurring within the last 10 years: No If all of the above answers are "NO", then may proceed with Cephalosporin use.     Consultations: Neurology  Procedures/Studies: CT Head Wo Contrast  Result Date: 09/12/2020 CLINICAL DATA:  Acute neurological deficit, suspecting stroke. Hyperglycemia and tremors. EXAM: CT HEAD WITHOUT CONTRAST TECHNIQUE: Contiguous axial images were obtained from the base of the skull through the vertex without intravenous contrast. COMPARISON:  07/29/2020 FINDINGS: Brain: Motion artifact limits examination even with repeat imaging. No evidence of mass effect or midline shift. No abnormal extra-axial fluid collections. Gray-white matter junctions are distinct. Basal cisterns are not effaced. Multiple calcifications in the falx and tentorium, unchanged. No acute intracranial hemorrhage.  Vascular: No hyperdense vessel or unexpected calcification. Skull: Calvarium appears intact. Sinuses/Orbits: Opacification of some of the ethmoid air cells. Paranasal sinuses and mastoid air cells are otherwise clear. Other: No significant changes since prior study. IMPRESSION: No acute intracranial abnormalities. Electronically Signed   By: Lucienne Capers M.D.   On: 09/12/2020 01:08   MR BRAIN WO CONTRAST  Result Date: 09/12/2020 CLINICAL DATA:  70 year old male with altered mental status. Hyperglycemia. Tremors. EXAM: MRI HEAD WITHOUT CONTRAST TECHNIQUE: Multiplanar, multiecho pulse sequences of the brain and surrounding structures were obtained without intravenous contrast. COMPARISON:  Head CT 0053 hours today, and earlier. FINDINGS: Brain: No restricted diffusion to suggest acute infarction. No midline shift, mass effect, evidence of mass lesion, ventriculomegaly, extra-axial collection or acute intracranial hemorrhage. Cervicomedullary junction and pituitary are within normal limits. Patchy mostly periventricular white matter T2 and FLAIR hyperintensity is mild to moderate for age. Occasional scattered subcortical white matter involvement. Superimposed several chronic microhemorrhages in the bilateral corona radiata and left caudate (series 13, image 35). But no cortical encephalomalacia or other chronic cerebral blood products identified. The other deep gray matter nuclei, the brainstem and cerebellum appear negative. Vascular: Major intracranial vascular flow voids are preserved. Skull  and upper cervical spine: Negative. Visualized bone marrow signal is within normal limits. Sinuses/Orbits: Negative orbits. Trace paranasal sinus mucosal thickening. Dental hardware susceptibility artifact. Other: Mastoids are clear. Grossly normal visible internal auditory structures. Visible scalp and face appear negative. IMPRESSION: 1. No acute intracranial abnormality. 2. Evidence of moderate for age chronic small  vessel disease, including several chronic micro-hemorrhages in the bilateral corona radiata and the left caudate. Electronically Signed   By: Genevie Ann M.D.   On: 09/12/2020 05:28   EEG adult  Result Date: 09/12/2020 Lora Havens, MD     09/12/2020  3:32 PM Patient Name: Nicholas Weiss MRN: [id number] Epilepsy Attending: Lora Havens Referring Physician/Provider: Dr Eugenie Norrie Date: 09/12/2020 Duration: 29.45 mins Patient history: 70 yo M with diffuse right-sided muscle twitching initially with hyperglycemia and later with hypoglycemia. EEG to evaluate for seizure. Level of alertness: Awake, asleep AEDs during EEG study: Pregabalin Technical aspects: This EEG study was done with scalp electrodes positioned according to the 10-20 International system of electrode placement. Electrical activity was acquired at a sampling rate of 500Hz  and reviewed with a high frequency filter of 70Hz  and a low frequency filter of 1Hz . EEG data were recorded continuously and digitally stored. Description: The posterior dominant rhythm consists of 10 Hz activity of moderate voltage (25-35 uV) seen predominantly in posterior head regions, symmetric and reactive to eye opening and eye closing. Sleep was characterized by vertex waves, sleep spindles (12 to 14 Hz), maximal frontocentral region. Physiologic photic driving was not seen during photic stimulation. Hyperventilation was not performed.   IMPRESSION: This study is within normal limits. No seizures or epileptiform discharges were seen throughout the recording. Priyanka Barbra Sarks    Subjective: Patient was seen and examined today.  No new complaint.  He was counseled again for substance abuse especially cocaine.  Discharge Exam: Vitals:   09/13/20 0401 09/13/20 0740  BP: 110/77 (!) 145/95  Pulse: 75 83  Resp: 16 18  Temp: 98 F (36.7 C) 98.1 F (36.7 C)  SpO2: 99% 100%   Vitals:   09/12/20 1929 09/12/20 2329 09/13/20 0401 09/13/20 0740  BP: (!) 162/101 129/76  110/77 (!) 145/95  Pulse: 82 83 75 83  Resp: 16 16 16 18   Temp: 98.1 F (36.7 C) 98.1 F (36.7 C) 98 F (36.7 C) 98.1 F (36.7 C)  TempSrc: Oral Oral Oral Oral  SpO2: 99% 99% 99% 100%  Weight:      Height:        General: Pt is alert, awake, not in acute distress Cardiovascular: RRR, S1/S2 +, no rubs, no gallops Respiratory: CTA bilaterally, no wheezing, no rhonchi Abdominal: Soft, NT, ND, bowel sounds + Extremities: no edema, no cyanosis   The results of significant diagnostics from this hospitalization (including imaging, microbiology, ancillary and laboratory) are listed below for reference.    Microbiology: Recent Results (from the past 240 hour(s))  Resp Panel by RT-PCR (Flu A&B, Covid) Nasopharyngeal Swab     Status: None   Collection Time: 09/12/20  2:34 AM   Specimen: Nasopharyngeal Swab; Nasopharyngeal(NP) swabs in vial transport medium  Result Value Ref Range Status   SARS Coronavirus 2 by RT PCR NEGATIVE NEGATIVE Final    Comment: (NOTE) SARS-CoV-2 target nucleic acids are NOT DETECTED.  The SARS-CoV-2 RNA is generally detectable in upper respiratory specimens during the acute phase of infection. The lowest concentration of SARS-CoV-2 viral copies this assay can detect is 138 copies/mL. A negative result does  not preclude SARS-Cov-2 infection and should not be used as the sole basis for treatment or other patient management decisions. A negative result may occur with  improper specimen collection/handling, submission of specimen other than nasopharyngeal swab, presence of viral mutation(s) within the areas targeted by this assay, and inadequate number of viral copies(<138 copies/mL). A negative result must be combined with clinical observations, patient history, and epidemiological information. The expected result is Negative.  Fact Sheet for Patients:  EntrepreneurPulse.com.au  Fact Sheet for Healthcare Providers:   IncredibleEmployment.be  This test is no t yet approved or cleared by the [place] FDA and  has been authorized for detection and/or diagnosis of SARS-CoV-2 by FDA under an Emergency Use Authorization (EUA). This EUA will remain  in effect (meaning this test can be used) for the duration of the COVID-19 declaration under Section 564(b)(1) of the Act, 21 U.S.C.section 360bbb-3(b)(1), unless the authorization is terminated  or revoked sooner.       Influenza A by PCR NEGATIVE NEGATIVE Final   Influenza B by PCR NEGATIVE NEGATIVE Final    Comment: (NOTE) The Xpert Xpress SARS-CoV-2/FLU/RSV plus assay is intended as an aid in the diagnosis of influenza from Nasopharyngeal swab specimens and should not be used as a sole basis for treatment. Nasal washings and aspirates are unacceptable for Xpert Xpress SARS-CoV-2/FLU/RSV testing.  Fact Sheet for Patients: EntrepreneurPulse.com.au  Fact Sheet for Healthcare Providers: IncredibleEmployment.be  This test is not yet approved or cleared by the [place] FDA and has been authorized for detection and/or diagnosis of SARS-CoV-2 by FDA under an Emergency Use Authorization (EUA). This EUA will remain in effect (meaning this test can be used) for the duration of the COVID-19 declaration under Section 564(b)(1) of the Act, 21 U.S.C. section 360bbb-3(b)(1), unless the authorization is terminated or revoked.  Performed at [facility], [address] [place], [place] [address]      Labs: BNP (last 3 results) No results for input(s): BNP in the last 8760 hours. Basic Metabolic Panel: Recent Labs  Lab 09/11/20 2011 09/11/20 2015 09/12/20 0521 09/13/20 0349  NA 130*  --  135 135  K 5.1  --  3.7 4.0  CL 106  --  105 108  CO2 23  --  24 25  GLUCOSE 284*  --  55* 123*  BUN 38*  --  32* 28*  CREATININE 1.84*  --  1.47* 1.46*  CALCIUM 8.0*  --  8.2* 7.8*  MG  --   2.0  --   --    Liver Function Tests: Recent Labs  Lab 09/11/20 2015  AST 22  ALT 15  ALKPHOS 37*  BILITOT 0.7  PROT 8.4*  ALBUMIN 3.0*   No results for input(s): LIPASE, AMYLASE in the last 168 hours. Recent Labs  Lab 09/12/20 0234  AMMONIA 23   CBC: Recent Labs  Lab 09/11/20 2011 09/12/20 0521  WBC 2.9* 2.8*  HGB 10.3* 10.7*  HCT 31.3* 32.8*  MCV 92.6 92.7  PLT 200 209   Cardiac Enzymes: Recent Labs  Lab 09/11/20 2015  CKTOTAL 177   BNP: Invalid input(s): POCBNP CBG: Recent Labs  Lab 09/12/20 0806 09/12/20 1159 09/12/20 1729 09/12/20 2110 09/13/20 0812  GLUCAP 142* 303* 128* 190* 196*   D-Dimer No results for input(s): DDIMER in the last 72 hours. Hgb A1c Recent Labs    09/11/20 2015  HGBA1C 10.8*   Lipid Profile No results for input(s): CHOL, HDL, LDLCALC, TRIG, CHOLHDL, LDLDIRECT in  the last 72 hours. Thyroid function studies Recent Labs    09/12/20 0234  TSH 1.722   Anemia work up No results for input(s): VITAMINB12, FOLATE, FERRITIN, TIBC, IRON, RETICCTPCT in the last 72 hours. Urinalysis    Component Value Date/Time   COLORURINE STRAW (A) 09/11/2020 2011   APPEARANCEUR CLEAR (A) 09/11/2020 2011   LABSPEC 1.009 09/11/2020 2011   PHURINE 5.0 09/11/2020 2011   GLUCOSEU >=500 (A) 09/11/2020 2011   HGBUR SMALL (A) 09/11/2020 2011   BILIRUBINUR NEGATIVE 09/11/2020 2011   KETONESUR NEGATIVE 09/11/2020 2011   PROTEINUR NEGATIVE 09/11/2020 2011   NITRITE NEGATIVE 09/11/2020 2011   LEUKOCYTESUR NEGATIVE 09/11/2020 2011   Sepsis Labs Invalid input(s): PROCALCITONIN,  WBC,  LACTICIDVEN Microbiology Recent Results (from the past 240 hour(s))  Resp Panel by RT-PCR (Flu A&B, Covid) Nasopharyngeal Swab     Status: None   Collection Time: 09/12/20  2:34 AM   Specimen: Nasopharyngeal Swab; Nasopharyngeal(NP) swabs in vial transport medium  Result Value Ref Range Status   SARS Coronavirus 2 by RT PCR NEGATIVE NEGATIVE Final    Comment:  (NOTE) SARS-CoV-2 target nucleic acids are NOT DETECTED.  The SARS-CoV-2 RNA is generally detectable in upper respiratory specimens during the acute phase of infection. The lowest concentration of SARS-CoV-2 viral copies this assay can detect is 138 copies/mL. A negative result does not preclude SARS-Cov-2 infection and should not be used as the sole basis for treatment or other patient management decisions. A negative result may occur with  improper specimen collection/handling, submission of specimen other than nasopharyngeal swab, presence of viral mutation(s) within the areas targeted by this assay, and inadequate number of viral copies(<138 copies/mL). A negative result must be combined with clinical observations, patient history, and epidemiological information. The expected result is Negative.  Fact Sheet for Patients:  EntrepreneurPulse.com.au  Fact Sheet for Healthcare Providers:  IncredibleEmployment.be  This test is no t yet approved or cleared by the [place] FDA and  has been authorized for detection and/or diagnosis of SARS-CoV-2 by FDA under an Emergency Use Authorization (EUA). This EUA will remain  in effect (meaning this test can be used) for the duration of the COVID-19 declaration under Section 564(b)(1) of the Act, 21 U.S.C.section 360bbb-3(b)(1), unless the authorization is terminated  or revoked sooner.       Influenza A by PCR NEGATIVE NEGATIVE Final   Influenza B by PCR NEGATIVE NEGATIVE Final    Comment: (NOTE) The Xpert Xpress SARS-CoV-2/FLU/RSV plus assay is intended as an aid in the diagnosis of influenza from Nasopharyngeal swab specimens and should not be used as a sole basis for treatment. Nasal washings and aspirates are unacceptable for Xpert Xpress SARS-CoV-2/FLU/RSV testing.  Fact Sheet for Patients: EntrepreneurPulse.com.au  Fact Sheet for Healthcare  Providers: IncredibleEmployment.be  This test is not yet approved or cleared by the [place] FDA and has been authorized for detection and/or diagnosis of SARS-CoV-2 by FDA under an Emergency Use Authorization (EUA). This EUA will remain in effect (meaning this test can be used) for the duration of the COVID-19 declaration under Section 564(b)(1) of the Act, 21 U.S.C. section 360bbb-3(b)(1), unless the authorization is terminated or revoked.  Performed at [facility], [address] [place], [place] [address]     Time coordinating discharge: Over 30 minutes  SIGNED:  Lorella Nimrod, MD  [facility] 09/13/2020, 9:53 AM  If 7PM-7AM, please contact night-coverage [email or web address]  This record has been created using Systems analyst. Errors have  been sought and corrected,but may not always be located. Such creation errors do not reflect on the standard of care.

## 2020-09-17 ENCOUNTER — Inpatient Hospital Stay: Payer: Medicare HMO

## 2020-09-17 ENCOUNTER — Inpatient Hospital Stay: Payer: Medicare HMO | Attending: Oncology

## 2020-09-17 VITALS — BP 158/80 | HR 80

## 2020-09-17 DIAGNOSIS — N183 Chronic kidney disease, stage 3 unspecified: Secondary | ICD-10-CM | POA: Diagnosis present

## 2020-09-17 DIAGNOSIS — D631 Anemia in chronic kidney disease: Secondary | ICD-10-CM

## 2020-09-17 DIAGNOSIS — C9000 Multiple myeloma not having achieved remission: Secondary | ICD-10-CM | POA: Diagnosis present

## 2020-09-17 DIAGNOSIS — D472 Monoclonal gammopathy: Secondary | ICD-10-CM

## 2020-09-17 LAB — HEMOGLOBIN AND HEMATOCRIT, BLOOD
HCT: 31.5 % — ABNORMAL LOW (ref 39.0–52.0)
Hemoglobin: 10.7 g/dL — ABNORMAL LOW (ref 13.0–17.0)

## 2020-09-17 MED ORDER — EPOETIN ALFA-EPBX 40000 UNIT/ML IJ SOLN
40000.0000 [IU] | Freq: Once | INTRAMUSCULAR | Status: AC
  Administered 2020-09-17: 40000 [IU] via SUBCUTANEOUS
  Filled 2020-09-17: qty 1

## 2020-09-18 DIAGNOSIS — H4921 Sixth [abducent] nerve palsy, right eye: Secondary | ICD-10-CM | POA: Diagnosis not present

## 2020-09-25 ENCOUNTER — Ambulatory Visit (INDEPENDENT_AMBULATORY_CARE_PROVIDER_SITE_OTHER): Payer: Medicare HMO

## 2020-09-25 ENCOUNTER — Encounter: Payer: Self-pay | Admitting: Oncology

## 2020-09-25 DIAGNOSIS — I10 Essential (primary) hypertension: Secondary | ICD-10-CM | POA: Diagnosis not present

## 2020-09-25 DIAGNOSIS — E1165 Type 2 diabetes mellitus with hyperglycemia: Secondary | ICD-10-CM

## 2020-09-25 NOTE — Chronic Care Management (AMB) (Signed)
  Care Management   Follow Up Note   09/25/2020 Name: Nicholas Weiss MRN: [id number] DOB: 1951/03/07   Primary Care Provider :Birdie Sons, MD Reason for referral : Chronic Care Management   Nicholas Weiss was referred to the care management team for assistance with chronic care management and care coordination. His primary care provider will be notified of our unsuccessful attempts to establish and maintain contact. The care management team will gladly outreach at any time in the future if he is interested in receiving assistance.   PLAN The care management team will gladly follow up with Nicholas Weiss after the primary care provider has a conversation with him regarding recommendation for care management engagement and subsequent re-referral for care management services.   [facility] Care Management [facility] [phone]    PLAN

## 2020-10-02 ENCOUNTER — Encounter: Payer: Self-pay | Admitting: Family Medicine

## 2020-10-02 ENCOUNTER — Other Ambulatory Visit: Payer: Self-pay

## 2020-10-02 ENCOUNTER — Ambulatory Visit (INDEPENDENT_AMBULATORY_CARE_PROVIDER_SITE_OTHER): Payer: Medicare HMO | Admitting: Family Medicine

## 2020-10-02 VITALS — BP 136/82 | HR 79 | Wt 183.0 lb

## 2020-10-02 DIAGNOSIS — D649 Anemia, unspecified: Secondary | ICD-10-CM

## 2020-10-02 DIAGNOSIS — N1832 Chronic kidney disease, stage 3b: Secondary | ICD-10-CM | POA: Diagnosis not present

## 2020-10-02 DIAGNOSIS — I10 Essential (primary) hypertension: Secondary | ICD-10-CM | POA: Diagnosis not present

## 2020-10-02 DIAGNOSIS — E1165 Type 2 diabetes mellitus with hyperglycemia: Secondary | ICD-10-CM | POA: Diagnosis not present

## 2020-10-02 MED ORDER — AMLODIPINE BESYLATE 10 MG PO TABS: 5.0000 mg | ORAL_TABLET | Freq: Every day | ORAL | Status: DC

## 2020-10-02 NOTE — Progress Notes (Signed)
Established patient visit   Patient: Nicholas Weiss   DOB: May 01, 1950   70 y.o. Male  MRN: [id number] Visit Date: 10/02/2020  Today's healthcare provider: Lelon Huh, MD   Chief Complaint  Patient presents with   Diabetes   Subjective    HPI  Diabetes Mellitus Type II, Follow-up  Lab Results  Component Value Date   HGBA1C 10.8 (H) 09/11/2020   HGBA1C 8.8 (A) 06/14/2020   HGBA1C 10.1 (A) 03/11/2020   Wt Readings from Last 3 Encounters:  10/02/20 183 lb (83 kg)  09/11/20 180 lb (81.6 kg)  08/06/20 178 lb (80.7 kg)   Last seen for diabetes 3 months ago.  Management since then includes continue same medications. Since last visit patient was hospitalized on 09/11/2020 for muscle twitching and Diabetes. Per discharge note, home dose of [name] was decreased from 25 to 20mg . He reports excellent compliance with treatment. He is not having side effects.  Symptoms: No fatigue No foot ulcerations  No appetite changes No nausea  Yes paresthesia of the feet  No polydipsia  No polyuria Yes visual disturbances   No vomiting     Home blood sugar records: fasting range: 140-210  Episodes of hypoglycemia? Yes    Current insulin regiment:  Most Recent Eye Exam: 06/01/2019 Current exercise: walking Current diet habits: in general, a "healthy" diet    Pertinent Labs: Lab Results  Component Value Date   CHOL 118 03/15/2020   HDL 49 03/15/2020   LDLCALC 53 03/15/2020   TRIG 80 03/15/2020   CHOLHDL 2.4 03/15/2020   Lab Results  Component Value Date   NA 135 09/13/2020   K 4.0 09/13/2020   CREATININE 1.46 (H) 09/13/2020   GFRNONAA 52 (L) 09/13/2020   GFRAA 54 (L) 03/15/2020   GLUCOSE 123 (H) 09/13/2020     ---------------------------------------------------------------------------------------------------      Medications: Outpatient Medications Prior to Visit  Medication Sig   Alcohol Swabs (B-D SINGLE USE SWABS REGULAR) PADS Use to check blood sugar four  times daily for type 1 diabetes   amLODipine (NORVASC) 10 MG tablet Take 1 tablet (10 mg total) by mouth daily.   aspirin 81 MG chewable tablet Chew 1 tablet by mouth daily.   Blood Glucose Calibration (TRUE METRIX LEVEL 1) Low SOLN Use to check blood sugar four times daily for type 1 diabetes   insulin aspart (NOVOLOG) 100 UNIT/ML injection Inject up to 14 units three times daily before meals according to sliding scale   insulin degludec (TRESIBA FLEXTOUCH) 100 UNIT/ML FlexTouch Pen 20 units daily.   Insulin Syringe-Needle U-100 (INSULIN SYRINGE 1CC/31GX5/16") 31G X 5/16" 1 ML MISC For insulin injections up to 4 times daily   lovastatin (MEVACOR) 40 MG tablet Take 1 tablet (40 mg total) by mouth at bedtime.   pregabalin (LYRICA) 75 MG capsule Take 75 mg by mouth in the morning and at bedtime.   No facility-administered medications prior to visit.    Review of Systems  Constitutional: Negative.   Respiratory:  Positive for cough. Negative for apnea, choking, chest tightness, shortness of breath, wheezing and stridor.   Cardiovascular: Negative.   Gastrointestinal: Negative.   Endocrine: Negative.   Musculoskeletal:  Positive for neck stiffness. Negative for arthralgias, back pain, gait problem, joint swelling, myalgias and neck pain.  Skin:  Negative for wound.  Neurological:  Positive for tremors. Negative for dizziness, light-headedness and headaches.      Objective    BP 136/82 (BP Location:  Right Arm, Patient Position: Sitting, Cuff Size: Large)   Pulse 79   Wt 183 lb (83 kg)   SpO2 100%   BMI 26.26 kg/m    Physical Exam   General: Appearance:     Well developed, well nourished male in no acute distress  Eyes:    PERRL, conjunctiva/corneas clear, EOM's intact       Lungs:     Clear to auscultation bilaterally, respirations unlabored  Heart:    Normal heart rate. Normal rhythm. No murmurs, rubs, or gallops.    MS:   All extremities are intact.    Neurologic:   Awake,  alert, oriented x 3. No apparent focal neurological           defect.         Assessment & Plan     1. Uncontrolled type 2 diabetes mellitus with hyperglycemia ([place]) Has cut back to 20 Tresiba due to hypglycemia and fastings still vary from 90s to low 200s. Continue current medication. Reprinted Novolog SSI  2. Stage 3b chronic kidney disease ([place])  - Comprehensive metabolic panel  3. Essential (primary) hypertension Well controlled, he has already reduced amlodipine to 1/2 tablet daily since he was getting on higher dose.  - amLODipine (NORVASC) 10 MG tablet; Take 0.5 tablets (5 mg total) by mouth daily.  4. Anemia, unspecified type  - CBC   Follow up to check A1c in about 6 weeks.       The entirety of the information documented in the History of Present Illness, Review of Systems and Physical Exam were personally obtained by me. Portions of this information were initially documented by the CMA and reviewed by me for thoroughness and accuracy.     Lelon Huh, MD  [facility] [phone] (phone) [phone] (fax)  [place]

## 2020-10-02 NOTE — Patient Instructions (Addendum)
Check blood sugar before each meal      If blood sugar is >=150, but <200, take 2 units Novolog insulin before eating     If blood sugar is >=200, but <250, take 4 units Novolog insulin     If blood sugar is >=250, but <300, take 6 units Novolog insulin     If blood sugar is >=300, but <350, take 8 units Novolog insulin     If blood sugar is >=350, but <400  take 10 units Novolog insulin  Please go to the lab draw station in [address] on the second floor of [facility] . Normal hours are 8:00am to 11:30am and 1:00pm to 4:00pm Monday through Friday

## 2020-10-03 DIAGNOSIS — H4921 Sixth [abducent] nerve palsy, right eye: Secondary | ICD-10-CM | POA: Diagnosis not present

## 2020-10-03 DIAGNOSIS — D649 Anemia, unspecified: Secondary | ICD-10-CM | POA: Diagnosis not present

## 2020-10-03 DIAGNOSIS — N1832 Chronic kidney disease, stage 3b: Secondary | ICD-10-CM | POA: Diagnosis not present

## 2020-10-04 LAB — CBC
Hematocrit: 33.1 % — ABNORMAL LOW (ref 37.5–51.0)
Hemoglobin: 11.2 g/dL — ABNORMAL LOW (ref 13.0–17.7)
MCH: 29.9 pg (ref 26.6–33.0)
MCHC: 33.8 g/dL (ref 31.5–35.7)
MCV: 89 fL (ref 79–97)
Platelets: 202 10*3/uL (ref 150–450)
RBC: 3.74 x10E6/uL — ABNORMAL LOW (ref 4.14–5.80)
RDW: 13.7 % (ref 11.6–15.4)
WBC: 2.9 10*3/uL — ABNORMAL LOW (ref 3.4–10.8)

## 2020-10-04 LAB — COMPREHENSIVE METABOLIC PANEL
ALT: 14 IU/L (ref 0–44)
AST: 13 IU/L (ref 0–40)
Albumin/Globulin Ratio: 0.6 — ABNORMAL LOW (ref 1.2–2.2)
Albumin: 3.3 g/dL — ABNORMAL LOW (ref 3.8–4.8)
Alkaline Phosphatase: 55 IU/L (ref 44–121)
BUN/Creatinine Ratio: 15 (ref 10–24)
BUN: 24 mg/dL (ref 8–27)
Bilirubin Total: 0.4 mg/dL (ref 0.0–1.2)
CO2: 23 mmol/L (ref 20–29)
Calcium: 8.5 mg/dL — ABNORMAL LOW (ref 8.6–10.2)
Chloride: 101 mmol/L (ref 96–106)
Creatinine, Ser: 1.62 mg/dL — ABNORMAL HIGH (ref 0.76–1.27)
Globulin, Total: 5.8 g/dL — ABNORMAL HIGH (ref 1.5–4.5)
Glucose: 205 mg/dL — ABNORMAL HIGH (ref 65–99)
Potassium: 4.5 mmol/L (ref 3.5–5.2)
Sodium: 135 mmol/L (ref 134–144)
Total Protein: 9.1 g/dL — ABNORMAL HIGH (ref 6.0–8.5)
eGFR: 46 mL/min/{1.73_m2} — ABNORMAL LOW (ref >59)

## 2020-10-08 ENCOUNTER — Inpatient Hospital Stay: Payer: Medicare HMO | Attending: Oncology

## 2020-10-08 ENCOUNTER — Other Ambulatory Visit: Payer: Self-pay

## 2020-10-08 ENCOUNTER — Inpatient Hospital Stay: Payer: Medicare HMO

## 2020-10-08 VITALS — BP 162/83 | HR 88

## 2020-10-08 DIAGNOSIS — N183 Chronic kidney disease, stage 3 unspecified: Secondary | ICD-10-CM | POA: Diagnosis present

## 2020-10-08 DIAGNOSIS — C9000 Multiple myeloma not having achieved remission: Secondary | ICD-10-CM | POA: Diagnosis present

## 2020-10-08 DIAGNOSIS — D472 Monoclonal gammopathy: Secondary | ICD-10-CM

## 2020-10-08 DIAGNOSIS — D631 Anemia in chronic kidney disease: Secondary | ICD-10-CM

## 2020-10-08 LAB — HEMOGLOBIN AND HEMATOCRIT, BLOOD
HCT: 32.2 % — ABNORMAL LOW (ref 39.0–52.0)
Hemoglobin: 10.7 g/dL — ABNORMAL LOW (ref 13.0–17.0)

## 2020-10-08 MED ORDER — EPOETIN ALFA-EPBX 40000 UNIT/ML IJ SOLN
40000.0000 [IU] | Freq: Once | INTRAMUSCULAR | Status: AC
  Administered 2020-10-08: 40000 [IU] via SUBCUTANEOUS
  Filled 2020-10-08: qty 1

## 2020-10-29 ENCOUNTER — Inpatient Hospital Stay: Payer: Medicare HMO

## 2020-10-29 ENCOUNTER — Inpatient Hospital Stay: Payer: Medicare HMO | Attending: Oncology

## 2020-10-29 VITALS — BP 156/87 | HR 86

## 2020-10-29 DIAGNOSIS — N183 Chronic kidney disease, stage 3 unspecified: Secondary | ICD-10-CM

## 2020-10-29 DIAGNOSIS — D631 Anemia in chronic kidney disease: Secondary | ICD-10-CM

## 2020-10-29 DIAGNOSIS — C9000 Multiple myeloma not having achieved remission: Secondary | ICD-10-CM | POA: Diagnosis not present

## 2020-10-29 DIAGNOSIS — D472 Monoclonal gammopathy: Secondary | ICD-10-CM

## 2020-10-29 LAB — HEMOGLOBIN AND HEMATOCRIT, BLOOD
HCT: 33.1 % — ABNORMAL LOW (ref 39.0–52.0)
Hemoglobin: 10.9 g/dL — ABNORMAL LOW (ref 13.0–17.0)

## 2020-10-29 MED ORDER — EPOETIN ALFA-EPBX 40000 UNIT/ML IJ SOLN
40000.0000 [IU] | Freq: Once | INTRAMUSCULAR | Status: AC
  Administered 2020-10-29: 40000 [IU] via SUBCUTANEOUS
  Filled 2020-10-29: qty 1

## 2020-11-03 ENCOUNTER — Other Ambulatory Visit: Payer: Self-pay | Admitting: Family Medicine

## 2020-11-03 NOTE — Telephone Encounter (Signed)
Requested medication (s) are due for refill today: -  Requested medication (s) are on the active medication list: hx med  Last refill:  09/12/20  Future visit scheduled: yes  Notes to clinic:  historical med and provider   Requested Prescriptions  Pending Prescriptions Disp Refills   pregabalin (LYRICA) 75 MG capsule [Pharmacy Med Name: Pregabalin 75 MG Oral Capsule] 60 capsule 0    Sig: Take 1 capsule by mouth twice daily      Not Delegated - Neurology:  Anticonvulsants - Controlled Failed - 11/03/2020  5:48 PM      Failed - This refill cannot be delegated      Passed - Valid encounter within last 12 months    Recent Outpatient Visits           1 month ago Uncontrolled type 2 diabetes mellitus with hyperglycemia [facility])   [facility] Birdie Sons, MD   4 months ago Uncontrolled type 2 diabetes mellitus with hyperglycemia [facility])   [facility] Birdie Sons, MD   7 months ago Uncontrolled type 2 diabetes mellitus with hyperglycemia [facility])   [facility] Birdie Sons, MD   10 months ago Uncontrolled type 2 diabetes mellitus with hyperglycemia [facility])   [facility] Birdie Sons, MD   1 year ago Uncontrolled type 2 diabetes mellitus with hyperglycemia [facility])   [facility] Birdie Sons, MD       Future Appointments             In 1 month Fisher, Kirstie Peri, MD [facility], [place]

## 2020-11-04 ENCOUNTER — Other Ambulatory Visit: Payer: Self-pay

## 2020-11-04 ENCOUNTER — Emergency Department
Admission: EM | Admit: 2020-11-04 | Discharge: 2020-11-04 | Disposition: A | Discharge status: Home / Self Care | Payer: Medicare HMO | Attending: Emergency Medicine | Admitting: Emergency Medicine

## 2020-11-04 ENCOUNTER — Encounter: Payer: Self-pay | Admitting: Oncology

## 2020-11-04 DIAGNOSIS — E1169 Type 2 diabetes mellitus with other specified complication: Secondary | ICD-10-CM | POA: Insufficient documentation

## 2020-11-04 DIAGNOSIS — R61 Generalized hyperhidrosis: Secondary | ICD-10-CM | POA: Diagnosis not present

## 2020-11-04 DIAGNOSIS — R110 Nausea: Secondary | ICD-10-CM | POA: Diagnosis not present

## 2020-11-04 DIAGNOSIS — E1140 Type 2 diabetes mellitus with diabetic neuropathy, unspecified: Secondary | ICD-10-CM | POA: Diagnosis not present

## 2020-11-04 DIAGNOSIS — Z7982 Long term (current) use of aspirin: Secondary | ICD-10-CM | POA: Insufficient documentation

## 2020-11-04 DIAGNOSIS — I959 Hypotension, unspecified: Secondary | ICD-10-CM | POA: Insufficient documentation

## 2020-11-04 DIAGNOSIS — E1165 Type 2 diabetes mellitus with hyperglycemia: Secondary | ICD-10-CM | POA: Insufficient documentation

## 2020-11-04 DIAGNOSIS — E860 Dehydration: Secondary | ICD-10-CM | POA: Diagnosis not present

## 2020-11-04 DIAGNOSIS — R531 Weakness: Secondary | ICD-10-CM | POA: Diagnosis not present

## 2020-11-04 DIAGNOSIS — I440 Atrioventricular block, first degree: Secondary | ICD-10-CM | POA: Diagnosis not present

## 2020-11-04 DIAGNOSIS — N183 Chronic kidney disease, stage 3 unspecified: Secondary | ICD-10-CM | POA: Diagnosis not present

## 2020-11-04 DIAGNOSIS — I129 Hypertensive chronic kidney disease with stage 1 through stage 4 chronic kidney disease, or unspecified chronic kidney disease: Secondary | ICD-10-CM | POA: Insufficient documentation

## 2020-11-04 DIAGNOSIS — Z794 Long term (current) use of insulin: Secondary | ICD-10-CM | POA: Insufficient documentation

## 2020-11-04 DIAGNOSIS — E7800 Pure hypercholesterolemia, unspecified: Secondary | ICD-10-CM | POA: Insufficient documentation

## 2020-11-04 DIAGNOSIS — R0902 Hypoxemia: Secondary | ICD-10-CM | POA: Diagnosis not present

## 2020-11-04 DIAGNOSIS — Z79899 Other long term (current) drug therapy: Secondary | ICD-10-CM | POA: Insufficient documentation

## 2020-11-04 DIAGNOSIS — E1122 Type 2 diabetes mellitus with diabetic chronic kidney disease: Secondary | ICD-10-CM | POA: Diagnosis not present

## 2020-11-04 LAB — CBC
HCT: 33.3 % — ABNORMAL LOW (ref 39.0–52.0)
Hemoglobin: 11.3 g/dL — ABNORMAL LOW (ref 13.0–17.0)
MCH: 31.4 pg (ref 26.0–34.0)
MCHC: 33.9 g/dL (ref 30.0–36.0)
MCV: 92.5 fL (ref 80.0–100.0)
Platelets: 168 10*3/uL (ref 150–400)
RBC: 3.6 MIL/uL — ABNORMAL LOW (ref 4.22–5.81)
RDW: 14.5 % (ref 11.5–15.5)
WBC: 4.3 10*3/uL (ref 4.0–10.5)
nRBC: 0 % (ref 0.0–0.2)

## 2020-11-04 LAB — TROPONIN I (HIGH SENSITIVITY): Troponin I (High Sensitivity): 23 ng/L — ABNORMAL HIGH (ref <18)

## 2020-11-04 LAB — BASIC METABOLIC PANEL
Anion gap: 3 — ABNORMAL LOW (ref 5–15)
BUN: 37 mg/dL — ABNORMAL HIGH (ref 8–23)
CO2: 27 mmol/L (ref 22–32)
Calcium: 8.3 mg/dL — ABNORMAL LOW (ref 8.9–10.3)
Chloride: 105 mmol/L (ref 98–111)
Creatinine, Ser: 2.51 mg/dL — ABNORMAL HIGH (ref 0.61–1.24)
GFR, Estimated: 27 mL/min — ABNORMAL LOW (ref >60)
Glucose, Bld: 190 mg/dL — ABNORMAL HIGH (ref 70–99)
Potassium: 4.5 mmol/L (ref 3.5–5.1)
Sodium: 135 mmol/L (ref 135–145)

## 2020-11-04 LAB — CBG MONITORING, ED: Glucose-Capillary: 217 mg/dL — ABNORMAL HIGH (ref 70–99)

## 2020-11-04 MED ORDER — SODIUM CHLORIDE 0.9 % IV BOLUS
1000.0000 mL | Freq: Once | INTRAVENOUS | Status: AC
  Administered 2020-11-04: 1000 mL via INTRAVENOUS

## 2020-11-04 NOTE — ED Triage Notes (Signed)
PER EMS: pt is from home with c/o of hypotension 84/55. He reports feeling fatigued since Thursday associated with nausea. Pt was pale and diaphoretic on EMS arrival. Hx of diabetes, CBG-150. A&Ox4. Denies pain .  300 cc NS given en route, last BP was 130/80.

## 2020-11-04 NOTE — ED Notes (Signed)
Pt A&Ox4 ambulatory at d/c with independent steady gait. NAD. Pt verbalized understanding of d/c instructions and follow up care.

## 2020-11-04 NOTE — ED Notes (Signed)
Pt ambulatory around ED with independent steady gait. He reported he felt fine and felt at baseline and good to go home. Pt denies dizziness or lightheadedness.

## 2020-11-04 NOTE — Discharge Instructions (Addendum)
Please seek medical attention for any high fevers, chest pain, shortness of breath, change in behavior, persistent vomiting, bloody stool or any other new or concerning symptoms.  

## 2020-11-04 NOTE — ED Provider Notes (Signed)
[facility] Emergency Department Provider Note   ____________________________________________   I have reviewed the triage vital signs and the nursing notes.   HISTORY  Chief Complaint Weakness   History limited by: Not Limited   HPI Nicholas Weiss is a 70 y.o. male who presents to the emergency department today because of concerns for weakness.  The patient states that he started noticing some lightheadedness and weakness today.  Patient states it was worse when he would stand up and then gradually get better.  He did feel like it made it hard for him to walk at the time.  States when EMS arrived she was found to be hypotensive.  Patient states that he has not ate or drink as much as he normally does today.  He denies any recent illness, fevers chest pain or shortness of breath. He says that similar symptoms have happened to him in the past.    Records reviewed. Per medical record review patient has a history of DKA, HLD, HTN.  Past Medical History:  Diagnosis Date   DKA (diabetic ketoacidoses) 04/06/2016   Hypercholesteremia    Hypertension     Patient Active Problem List   Diagnosis Date Noted   Muscle twitching 09/12/2020   Smoldering multiple myeloma ([place]) 08/02/2020   Pre-syncope 08/01/2020   Heart block AV first degree 08/01/2020   Anemia of chronic kidney failure, stage 3 (moderate) ([place]) 05/28/2020   PUD (peptic ulcer disease)    Esophageal dysphagia    Encounter for screening colonoscopy    Hyperglycemia due to type 2 diabetes mellitus ([place]) 07/28/2019   Generalized weakness 07/28/2019   Type 2 diabetes mellitus with hyperglycemia ([place]) 07/28/2019   Hypoglycemia 04/27/2019   Unresponsiveness 04/27/2019   Lung nodule 04/07/2019   Uncontrolled type 1 diabetes mellitus ([place])    Acute renal failure with acute tubular necrosis superimposed on stage 3a chronic kidney disease ([place])    Hypertensive urgency 04/05/2019   AKI (acute kidney injury)  ([place]) 04/05/2019   CKD (chronic kidney disease) stage 3, GFR 30-59 ml/min ([place]) 04/05/2019   DKA (diabetic ketoacidoses) 04/06/2016   Hypercholesterolemia 01/24/2016   Essential (primary) hypertension 06/07/2006   Type 1 diabetes mellitus with diabetic neuropathy, unspecified ([place]) 05/31/2006    Past Surgical History:  Procedure Laterality Date   COLONOSCOPY WITH PROPOFOL N/A 02/01/2020   Procedure: COLONOSCOPY WITH PROPOFOL;  Surgeon: Lin Landsman, MD;  Location: [facility] ENDOSCOPY;  Service: Gastroenterology;  Laterality: N/A;   ESOPHAGOGASTRODUODENOSCOPY  02/01/2020   Procedure: ESOPHAGOGASTRODUODENOSCOPY (EGD);  Surgeon: Lin Landsman, MD;  Location: [facility] ENDOSCOPY;  Service: Gastroenterology;;   KNEE SURGERY Right    Torn meniscus   KNEE SURGERY Left     Prior to Admission medications   Medication Sig Start Date End Date Taking? Authorizing Provider  Alcohol Swabs (B-D SINGLE USE SWABS REGULAR) PADS Use to check blood sugar four times daily for type 1 diabetes 07/11/20   Birdie Sons, MD  amLODipine (NORVASC) 10 MG tablet Take 0.5 tablets (5 mg total) by mouth daily. 10/02/20   Birdie Sons, MD  aspirin 81 MG chewable tablet Chew 1 tablet by mouth daily.    [provider]  Blood Glucose Calibration (TRUE METRIX LEVEL 1) Low SOLN Use to check blood sugar four times daily for type 1 diabetes 07/11/20   Birdie Sons, MD  insulin aspart (NOVOLOG) 100 UNIT/ML injection Inject up to 14 units three times daily before meals according to sliding scale 06/25/20  Birdie Sons, MD  insulin degludec (TRESIBA FLEXTOUCH) 100 UNIT/ML FlexTouch Pen 20 units daily. 09/13/20   Lorella Nimrod, MD  Insulin Syringe-Needle U-100 (INSULIN SYRINGE 1CC/31GX5/16") 31G X 5/16" 1 ML MISC For insulin injections up to 4 times daily 07/11/20   Birdie Sons, MD  lovastatin (MEVACOR) 40 MG tablet Take 1 tablet (40 mg total) by mouth at bedtime. 08/02/18   Birdie Sons, MD  pregabalin  (LYRICA) 75 MG capsule Take 75 mg by mouth in the morning and at bedtime. 05/31/20   [provider]    Allergies Penicillins  Family History  Problem Relation Age of Onset   Heart attack Father    Hypertension Sister    Cancer Sister     Social History Social History   Tobacco Use   Smoking status: Never   Smokeless tobacco: Never  Vaping Use   Vaping Use: Never used  Substance Use Topics   Alcohol use: Yes    Alcohol/week: 0.0 - 1.0 standard drinks    Comment: "once every 2 months"   Drug use: Not Currently    Types: Marijuana    Comment: "none in a long time'    Review of Systems Constitutional: No fever/chills Eyes: No visual changes. ENT: No sore throat. Cardiovascular: Denies chest pain. Respiratory: Denies shortness of breath. Gastrointestinal: No abdominal pain.  No nausea, no vomiting.  No diarrhea.   Genitourinary: Negative for dysuria. Musculoskeletal: Negative for back pain. Skin: Negative for rash. Neurological: Positive for lightheadedness.   ____________________________________________   PHYSICAL EXAM:  VITAL SIGNS: ED Triage Vitals  Enc Vitals Group     BP 11/04/20 1942 119/73     Pulse Rate 11/04/20 1942 74     Resp 11/04/20 1942 18     Temp 11/04/20 1942 97.7 F (36.5 C)     Temp Source 11/04/20 1942 Oral     SpO2 11/04/20 1942 99 %     Weight 11/04/20 1939 179 lb (81.2 kg)     Height 11/04/20 1939 5' 10.5" (1.791 m)     Head Circumference --      Peak Flow --      Pain Score 11/04/20 1939 0   Constitutional: Alert and oriented.  Eyes: Conjunctivae are normal.  ENT      Head: Normocephalic and atraumatic.      Nose: No congestion/rhinnorhea.      Mouth/Throat: Mucous membranes are moist.      Neck: No stridor. Hematological/Lymphatic/Immunilogical: No cervical lymphadenopathy. Cardiovascular: Normal rate, regular rhythm.  No murmurs, rubs, or gallops.  Respiratory: Normal respiratory effort without tachypnea nor  retractions. Breath sounds are clear and equal bilaterally. No wheezes/rales/rhonchi. Gastrointestinal: Soft and non tender. No rebound. No guarding.  Genitourinary: Deferred Musculoskeletal: Normal range of motion in all extremities. No lower extremity edema. Neurologic:  Normal speech and language. No gross focal neurologic deficits are appreciated.  Skin:  Skin is warm, dry and intact. No rash noted. Psychiatric: Mood and affect are normal. Speech and behavior are normal. Patient exhibits appropriate insight and judgment.  ____________________________________________    LABS (pertinent positives/negatives)  Trop hs 23 CBC wbc 4.3, hgb 11.3, plt 168 BMP na 135, k 4.5, glu 190, cr 2.51   ____________________________________________    RADIOLOGY  None  ____________________________________________   PROCEDURES  Procedures  ____________________________________________   INITIAL IMPRESSION / ASSESSMENT AND PLAN / ED COURSE  Pertinent labs & imaging results that were available during my care of the patient were reviewed  by me and considered in my medical decision making (see chart for details).   Patient presented to the emergency department today because of concerns for minute episodes of lightheadedness today.  He did have concerns for some possible dehydration.  Creatinine was elevated over his baseline.  Patient was given IV fluids here.  He stated he did feel better and when he was ambulated he had improved.  The patient did feel comfortable going home and I think that is reasonable at this time.   ____________________________________________   FINAL CLINICAL IMPRESSION(S) / ED DIAGNOSES  Final diagnoses:  Dehydration  Weakness     Note: This dictation was prepared with Dragon dictation. Any transcriptional errors that result from this process are unintentional     Nance Pear, MD 11/04/20 2340

## 2020-11-08 DIAGNOSIS — D649 Anemia, unspecified: Secondary | ICD-10-CM | POA: Insufficient documentation

## 2020-11-08 DIAGNOSIS — D631 Anemia in chronic kidney disease: Secondary | ICD-10-CM | POA: Diagnosis not present

## 2020-11-08 DIAGNOSIS — E1022 Type 1 diabetes mellitus with diabetic chronic kidney disease: Secondary | ICD-10-CM | POA: Diagnosis not present

## 2020-11-08 DIAGNOSIS — D638 Anemia in other chronic diseases classified elsewhere: Secondary | ICD-10-CM | POA: Insufficient documentation

## 2020-11-08 DIAGNOSIS — N184 Chronic kidney disease, stage 4 (severe): Secondary | ICD-10-CM | POA: Diagnosis not present

## 2020-11-13 ENCOUNTER — Encounter: Payer: Self-pay | Admitting: Oncology

## 2020-11-19 ENCOUNTER — Inpatient Hospital Stay: Payer: Medicare HMO

## 2020-11-19 VITALS — BP 171/89 | HR 82

## 2020-11-19 DIAGNOSIS — N183 Chronic kidney disease, stage 3 unspecified: Secondary | ICD-10-CM | POA: Diagnosis not present

## 2020-11-19 DIAGNOSIS — D472 Monoclonal gammopathy: Secondary | ICD-10-CM

## 2020-11-19 DIAGNOSIS — C9000 Multiple myeloma not having achieved remission: Secondary | ICD-10-CM | POA: Diagnosis not present

## 2020-11-19 DIAGNOSIS — D631 Anemia in chronic kidney disease: Secondary | ICD-10-CM

## 2020-11-19 LAB — HEMOGLOBIN AND HEMATOCRIT, BLOOD
HCT: 31.1 % — ABNORMAL LOW (ref 39.0–52.0)
Hemoglobin: 10.3 g/dL — ABNORMAL LOW (ref 13.0–17.0)

## 2020-11-19 MED ORDER — EPOETIN ALFA-EPBX 40000 UNIT/ML IJ SOLN: 40000.0000 [IU] | Freq: Once | INTRAMUSCULAR | Status: DC

## 2020-11-28 ENCOUNTER — Encounter: Payer: Self-pay | Admitting: Emergency Medicine

## 2020-11-28 ENCOUNTER — Emergency Department: Payer: Medicare (Managed Care)

## 2020-11-28 ENCOUNTER — Inpatient Hospital Stay
Admission: EM | Admit: 2020-11-28 | Discharge: 2020-12-04 | DRG: 637 | Disposition: A | Discharge status: Home Health | Payer: Medicare (Managed Care) | Attending: Internal Medicine | Admitting: Internal Medicine

## 2020-11-28 ENCOUNTER — Other Ambulatory Visit: Payer: Self-pay

## 2020-11-28 DIAGNOSIS — E871 Hypo-osmolality and hyponatremia: Secondary | ICD-10-CM | POA: Diagnosis present

## 2020-11-28 DIAGNOSIS — Z79899 Other long term (current) drug therapy: Secondary | ICD-10-CM

## 2020-11-28 DIAGNOSIS — R259 Unspecified abnormal involuntary movements: Secondary | ICD-10-CM

## 2020-11-28 DIAGNOSIS — D709 Neutropenia, unspecified: Secondary | ICD-10-CM | POA: Diagnosis present

## 2020-11-28 DIAGNOSIS — Z20822 Contact with and (suspected) exposure to covid-19: Secondary | ICD-10-CM | POA: Diagnosis present

## 2020-11-28 DIAGNOSIS — E1165 Type 2 diabetes mellitus with hyperglycemia: Secondary | ICD-10-CM

## 2020-11-28 DIAGNOSIS — I129 Hypertensive chronic kidney disease with stage 1 through stage 4 chronic kidney disease, or unspecified chronic kidney disease: Secondary | ICD-10-CM | POA: Diagnosis present

## 2020-11-28 DIAGNOSIS — R451 Restlessness and agitation: Secondary | ICD-10-CM | POA: Diagnosis not present

## 2020-11-28 DIAGNOSIS — N183 Chronic kidney disease, stage 3 unspecified: Secondary | ICD-10-CM | POA: Diagnosis present

## 2020-11-28 DIAGNOSIS — E162 Hypoglycemia, unspecified: Secondary | ICD-10-CM | POA: Diagnosis present

## 2020-11-28 DIAGNOSIS — C9000 Multiple myeloma not having achieved remission: Secondary | ICD-10-CM | POA: Diagnosis present

## 2020-11-28 DIAGNOSIS — E860 Dehydration: Secondary | ICD-10-CM | POA: Diagnosis not present

## 2020-11-28 DIAGNOSIS — D631 Anemia in chronic kidney disease: Secondary | ICD-10-CM | POA: Diagnosis present

## 2020-11-28 DIAGNOSIS — E10649 Type 1 diabetes mellitus with hypoglycemia without coma: Secondary | ICD-10-CM | POA: Diagnosis not present

## 2020-11-28 DIAGNOSIS — Z7982 Long term (current) use of aspirin: Secondary | ICD-10-CM

## 2020-11-28 DIAGNOSIS — N1832 Chronic kidney disease, stage 3b: Secondary | ICD-10-CM | POA: Diagnosis present

## 2020-11-28 DIAGNOSIS — N179 Acute kidney failure, unspecified: Secondary | ICD-10-CM | POA: Diagnosis present

## 2020-11-28 DIAGNOSIS — F05 Delirium due to known physiological condition: Secondary | ICD-10-CM | POA: Diagnosis present

## 2020-11-28 DIAGNOSIS — G253 Myoclonus: Secondary | ICD-10-CM | POA: Diagnosis present

## 2020-11-28 DIAGNOSIS — D696 Thrombocytopenia, unspecified: Secondary | ICD-10-CM | POA: Diagnosis present

## 2020-11-28 DIAGNOSIS — Z794 Long term (current) use of insulin: Secondary | ICD-10-CM

## 2020-11-28 DIAGNOSIS — E1065 Type 1 diabetes mellitus with hyperglycemia: Secondary | ICD-10-CM | POA: Diagnosis present

## 2020-11-28 DIAGNOSIS — F1210 Cannabis abuse, uncomplicated: Secondary | ICD-10-CM | POA: Diagnosis present

## 2020-11-28 DIAGNOSIS — Z880 Allergy status to penicillin: Secondary | ICD-10-CM

## 2020-11-28 DIAGNOSIS — F1410 Cocaine abuse, uncomplicated: Secondary | ICD-10-CM | POA: Diagnosis present

## 2020-11-28 DIAGNOSIS — Z8249 Family history of ischemic heart disease and other diseases of the circulatory system: Secondary | ICD-10-CM

## 2020-11-28 DIAGNOSIS — E1040 Type 1 diabetes mellitus with diabetic neuropathy, unspecified: Secondary | ICD-10-CM | POA: Diagnosis present

## 2020-11-28 DIAGNOSIS — G9341 Metabolic encephalopathy: Secondary | ICD-10-CM | POA: Diagnosis present

## 2020-11-28 DIAGNOSIS — E1022 Type 1 diabetes mellitus with diabetic chronic kidney disease: Secondary | ICD-10-CM | POA: Diagnosis present

## 2020-11-28 DIAGNOSIS — E7800 Pure hypercholesterolemia, unspecified: Secondary | ICD-10-CM | POA: Diagnosis present

## 2020-11-28 DIAGNOSIS — I10 Essential (primary) hypertension: Secondary | ICD-10-CM

## 2020-11-28 DIAGNOSIS — Z8711 Personal history of peptic ulcer disease: Secondary | ICD-10-CM

## 2020-11-28 LAB — CBC WITH DIFFERENTIAL/PLATELET
Abs Immature Granulocytes: 0.01 10*3/uL (ref 0.00–0.07)
Basophils Absolute: 0 10*3/uL (ref 0.0–0.1)
Basophils Relative: 0 %
Eosinophils Absolute: 0 10*3/uL (ref 0.0–0.5)
Eosinophils Relative: 1 %
HCT: 30.4 % — ABNORMAL LOW (ref 39.0–52.0)
Hemoglobin: 10 g/dL — ABNORMAL LOW (ref 13.0–17.0)
Immature Granulocytes: 0 %
Lymphocytes Relative: 28 %
Lymphs Abs: 0.9 10*3/uL (ref 0.7–4.0)
MCH: 30.8 pg (ref 26.0–34.0)
MCHC: 32.9 g/dL (ref 30.0–36.0)
MCV: 93.5 fL (ref 80.0–100.0)
Monocytes Absolute: 0.4 10*3/uL (ref 0.1–1.0)
Monocytes Relative: 13 %
Neutro Abs: 1.7 10*3/uL (ref 1.7–7.7)
Neutrophils Relative %: 58 %
Platelets: 130 10*3/uL — ABNORMAL LOW (ref 150–400)
RBC: 3.25 MIL/uL — ABNORMAL LOW (ref 4.22–5.81)
RDW: 14.3 % (ref 11.5–15.5)
WBC: 3 10*3/uL — ABNORMAL LOW (ref 4.0–10.5)
nRBC: 0 % (ref 0.0–0.2)

## 2020-11-28 LAB — CBG MONITORING, ED
Glucose-Capillary: 135 mg/dL — ABNORMAL HIGH (ref 70–99)
Glucose-Capillary: 169 mg/dL — ABNORMAL HIGH (ref 70–99)
Glucose-Capillary: 60 mg/dL — ABNORMAL LOW (ref 70–99)
Glucose-Capillary: 42 mg/dL — CL (ref 70–99)
Glucose-Capillary: 138 mg/dL — ABNORMAL HIGH (ref 70–99)

## 2020-11-28 LAB — BASIC METABOLIC PANEL
Anion gap: 6 (ref 5–15)
BUN: 33 mg/dL — ABNORMAL HIGH (ref 8–23)
CO2: 25 mmol/L (ref 22–32)
Calcium: 8.4 mg/dL — ABNORMAL LOW (ref 8.9–10.3)
Chloride: 104 mmol/L (ref 98–111)
Creatinine, Ser: 2.29 mg/dL — ABNORMAL HIGH (ref 0.61–1.24)
GFR, Estimated: 31 mL/min — ABNORMAL LOW (ref >60)
Glucose, Bld: 130 mg/dL — ABNORMAL HIGH (ref 70–99)
Potassium: 4.1 mmol/L (ref 3.5–5.1)
Sodium: 135 mmol/L (ref 135–145)

## 2020-11-28 LAB — CBC
HCT: 29.8 % — ABNORMAL LOW (ref 39.0–52.0)
Hemoglobin: 10.1 g/dL — ABNORMAL LOW (ref 13.0–17.0)
MCH: 31.3 pg (ref 26.0–34.0)
MCHC: 33.9 g/dL (ref 30.0–36.0)
MCV: 92.3 fL (ref 80.0–100.0)
Platelets: 138 10*3/uL — ABNORMAL LOW (ref 150–400)
RBC: 3.23 MIL/uL — ABNORMAL LOW (ref 4.22–5.81)
RDW: 14.1 % (ref 11.5–15.5)
WBC: 2.4 10*3/uL — ABNORMAL LOW (ref 4.0–10.5)
nRBC: 0 % (ref 0.0–0.2)

## 2020-11-28 IMAGING — CT CT CERVICAL SPINE W/O CM
4 of 8 series · 11 of 34 positions shown, 12 images · non-contrast
Comparison: None.

CLINICAL DATA: Hypoglycemia.

EXAM:
CT CERVICAL SPINE WITHOUT CONTRAST
TECHNIQUE: Multidetector CT imaging of the cervical spine was performed without
intravenous contrast. Multiplanar CT image reconstructions were also
generated.

[Series 6: sagittal bone · sagittal · 0.25mm/px · 6 of 80 slices shown]
[im 14/80  bone]
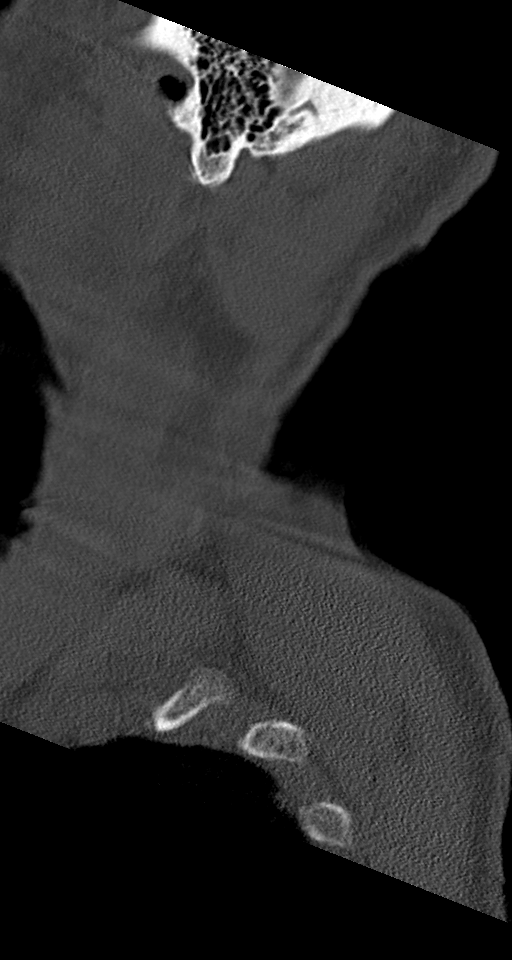
[im 21/80  soft-tissue]
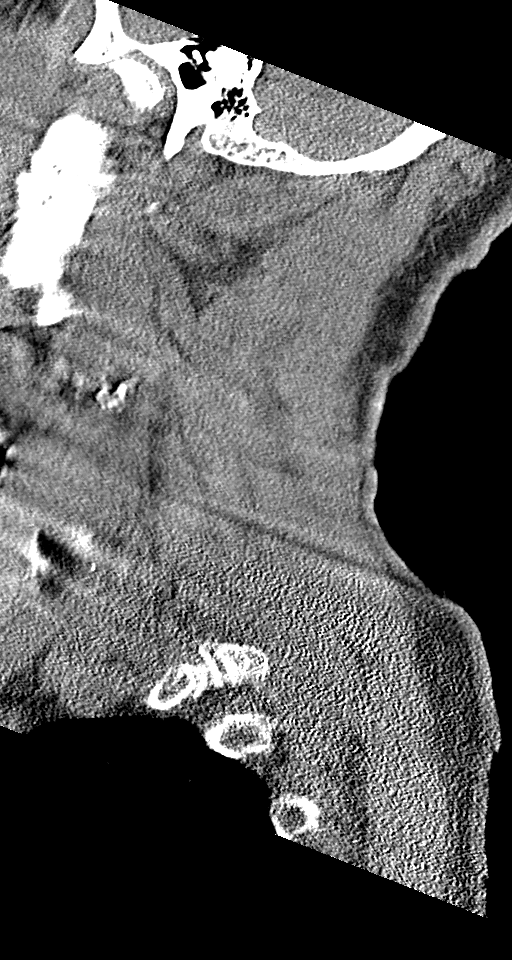
[im 27/80  bone]
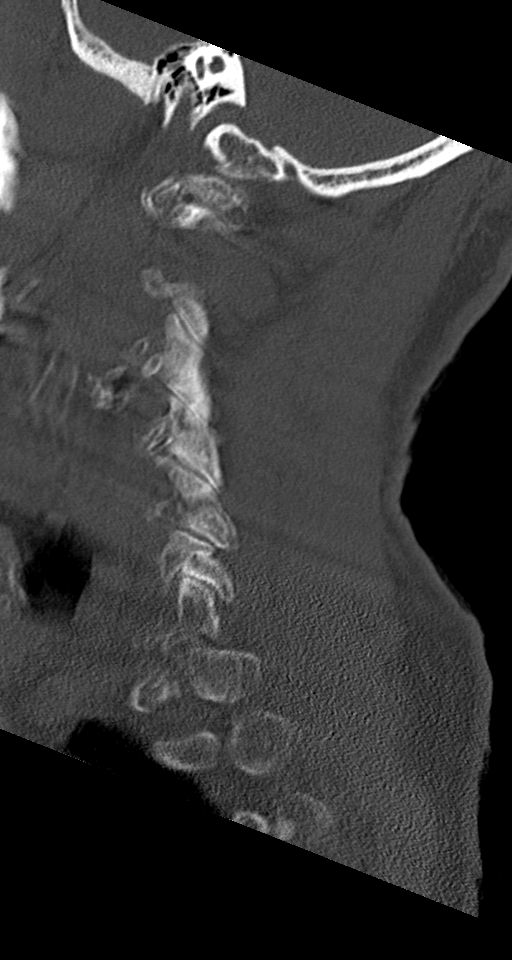
[im 40/80  bone]
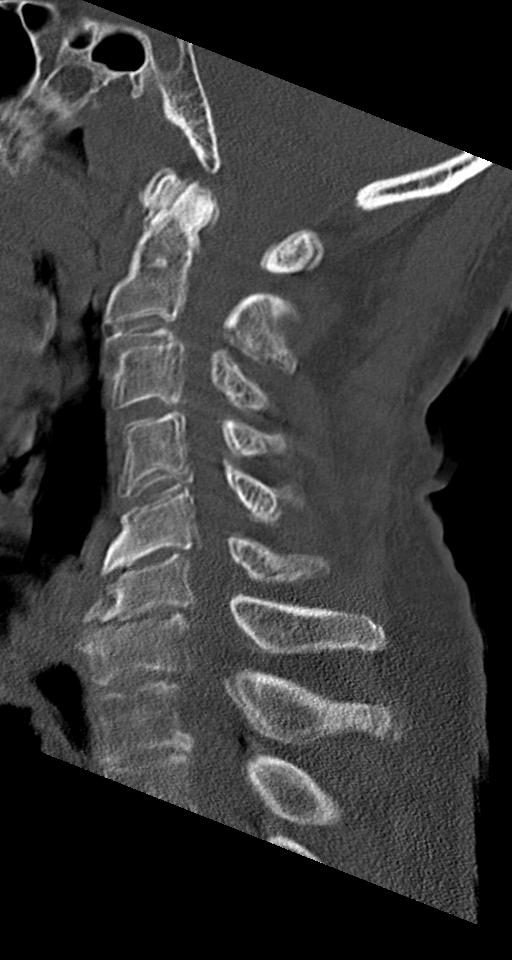
[im 53/80  bone]
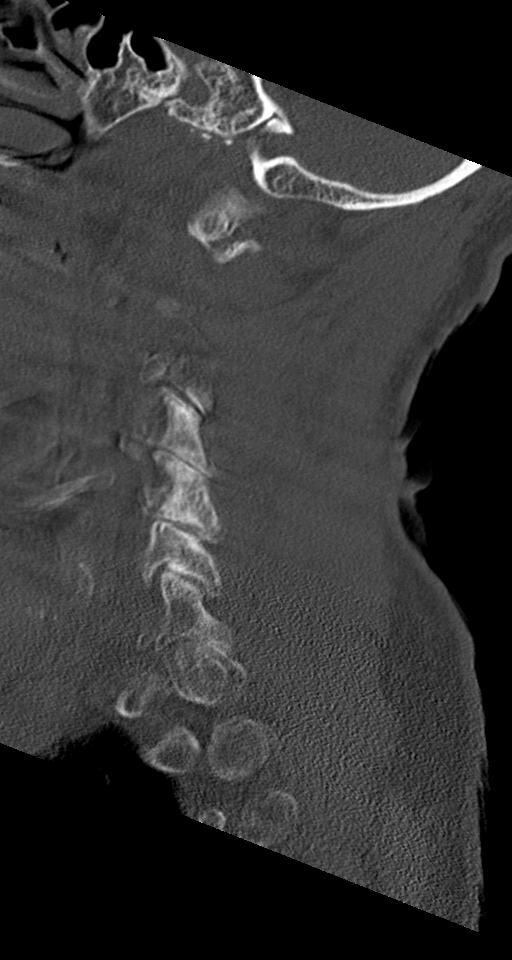
[im 66/80  bone]
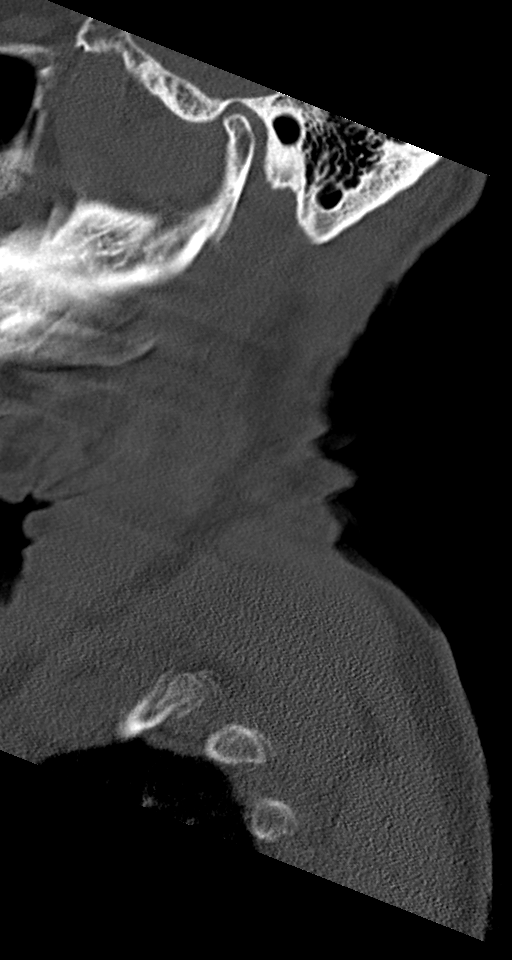

[Series 7: coronal bone · coronal · 0.31mm/px · 1 of 65 slices shown]
[im 33/65  bone]
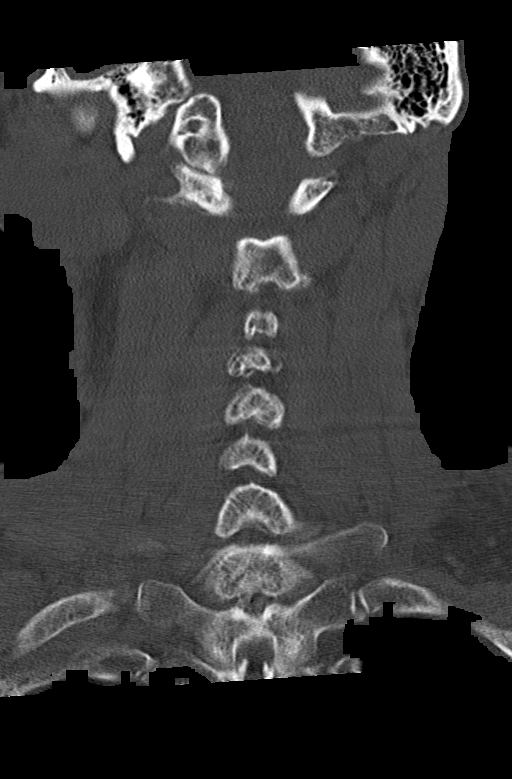

[Series 8: orthogonal bone · axial · 0.25mm/px · z∈[+96,+170]mm · 2 of 122 slices shown, 3 images (1 of 2)]
[im 41/122  soft-tissue]
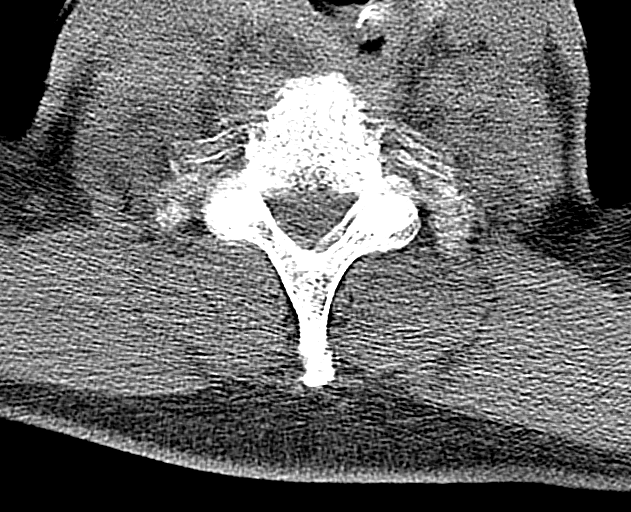
[im 41/122  bone]
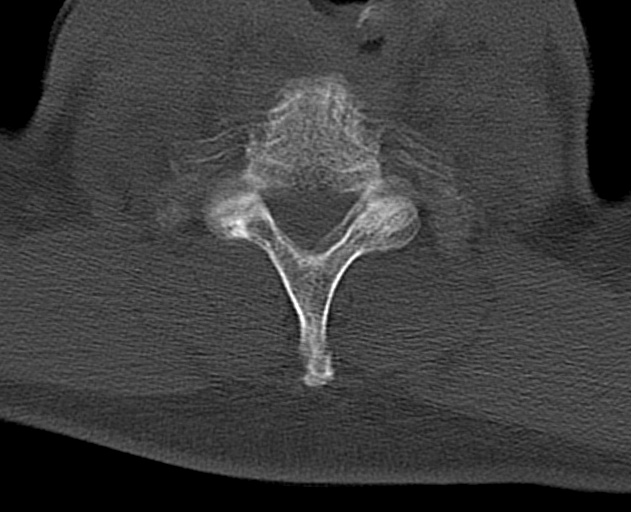
[im 81/122  bone]
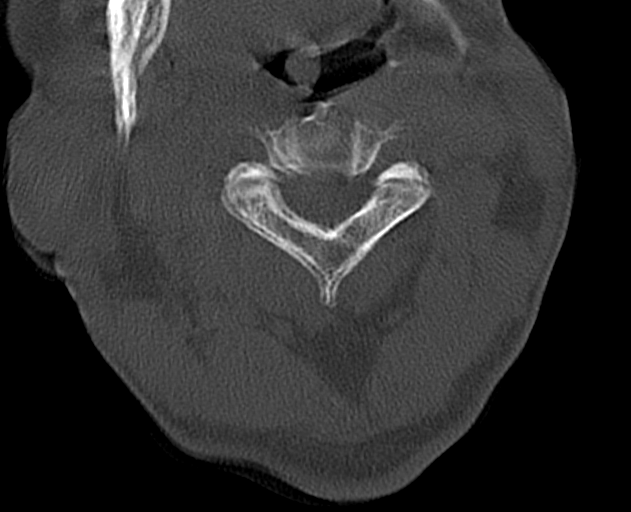

[Series 15: orthogonal bone · axial · 0.22mm/px · z∈[+107,+179]mm · 2 of 115 slices shown (2 of 2)]
[im 39/115  bone]
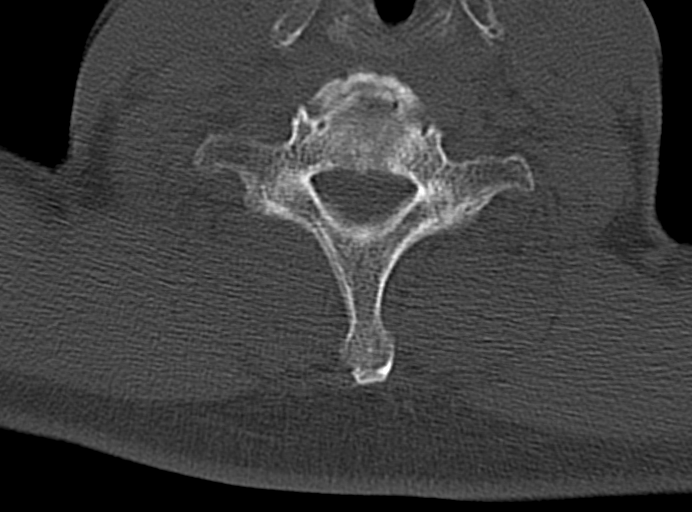
[im 77/115  bone]
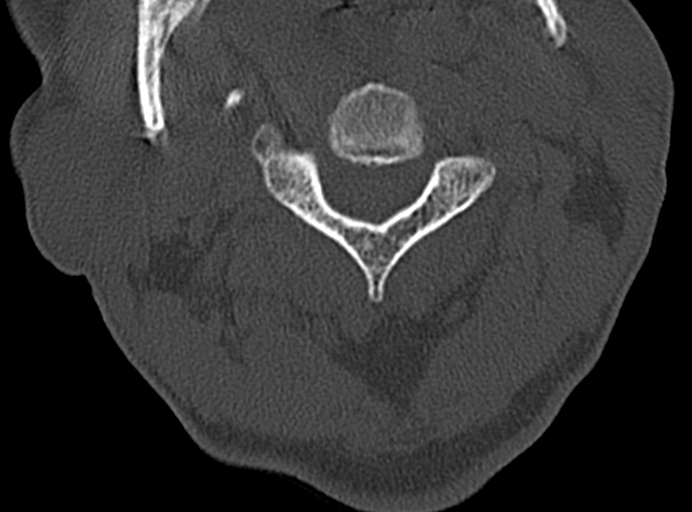

[11 of 34 positions shown; findings below may reference images not displayed]

FINDINGS: Alignment: Normal.

Skull base and vertebrae: No acute fracture. No primary bone lesion
or focal pathologic process.

Soft tissues and spinal canal: No prevertebral fluid or swelling. No
visible canal hematoma.

Disc levels: Mild endplate sclerosis and mild to moderate severity
anterior osteophyte formation is seen at the levels of C5-C6 and
C6-C7.

Mild intervertebral disc space narrowing is seen at the levels of
C5-C6 and C6-C7.

Mild, bilateral multilevel facet joint hypertrophy is noted.

Upper chest: Negative.

Other: None.
IMPRESSION: 1. No acute fracture or subluxation of the cervical spine.
2. Mild to moderate severity degenerative changes at the levels of
C5-C6 and C6-C7.

## 2020-11-28 IMAGING — CT CT HEAD W/O CM
3 of 6 series · 16 of 47 positions shown, 19 images · non-contrast
Comparison: [DATE]

CLINICAL DATA: Hypoglycemia.

EXAM:
CT HEAD WITHOUT CONTRAST
TECHNIQUE: Contiguous axial images were obtained from the base of the skull
through the vertex without intravenous contrast.

[Series 3: coronal soft tissue · coronal · 0.32mm/px · 3 of 71 slices shown]
[im 18/71  brain]
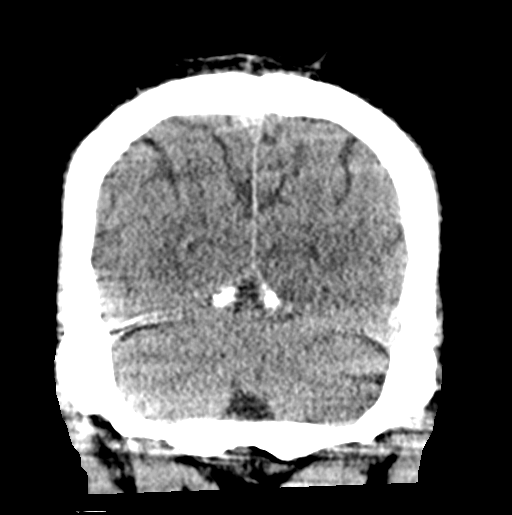
[im 36/71  brain]
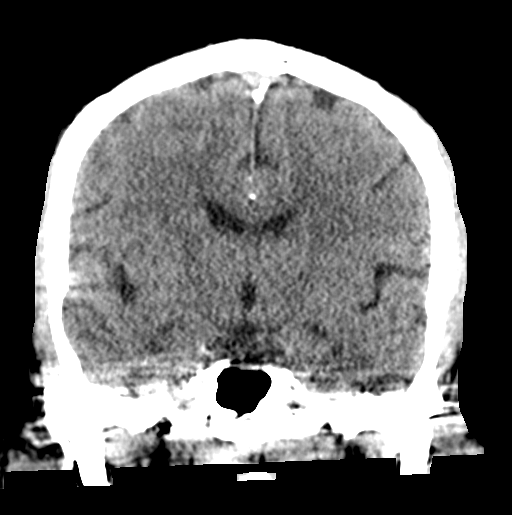
[im 53/71  brain]
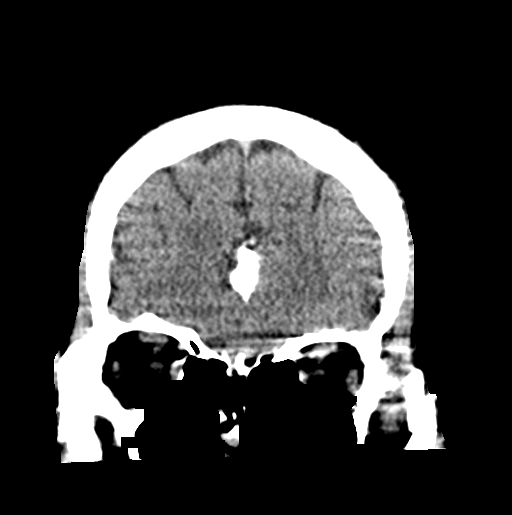

[Series 4: sagittal soft tissue · sagittal · 0.32mm/px · 2 of 56 slices shown]
[im 19/56  brain]
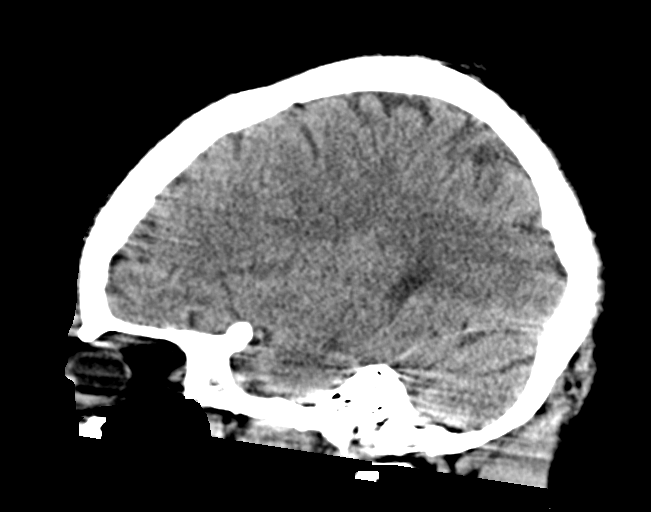
[im 37/56  brain]
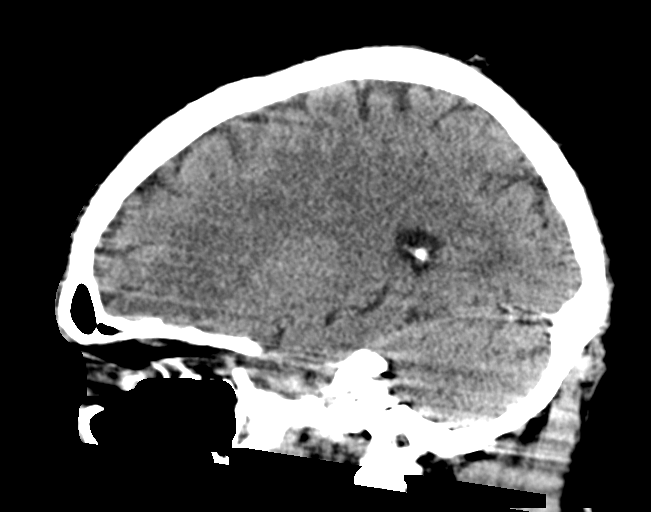

[Series 6: head wo · axial · 0.42mm/px · z∈[+243,+373]mm · 11 of 32 slices shown, 14 images]
[im 3/32  brain]
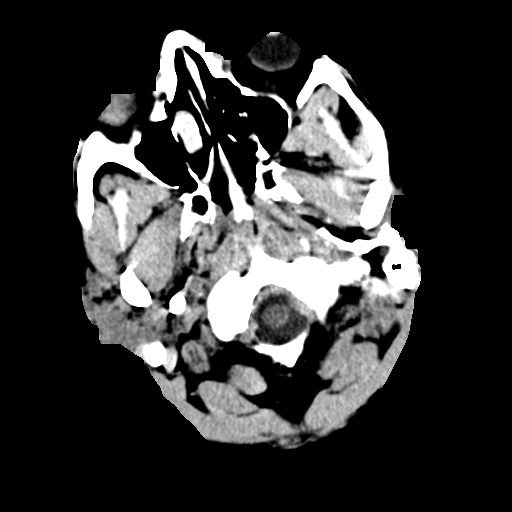
[im 3/32  bone]
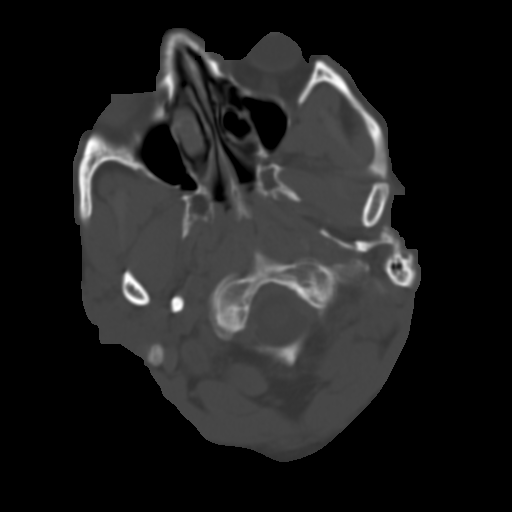
[im 5/32  brain]
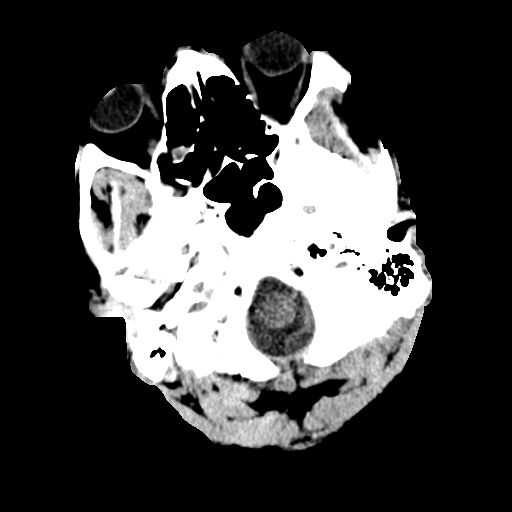
[im 7/32  brain]
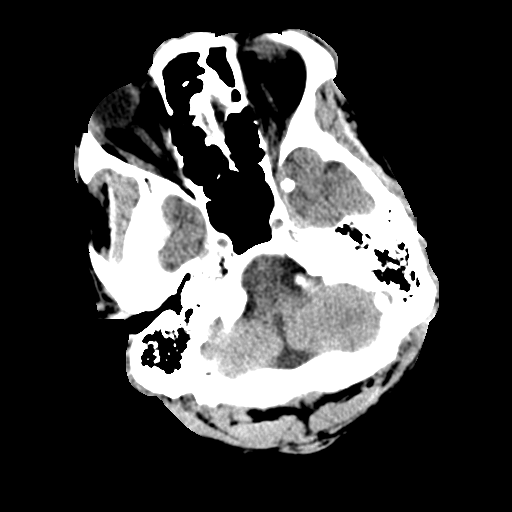
[im 12/32  brain]
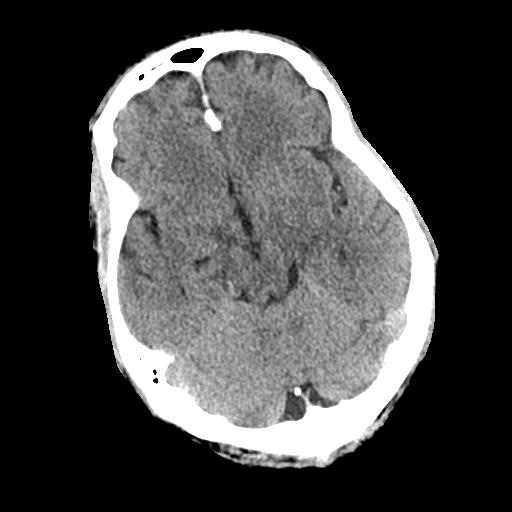
[im 14/32  brain]
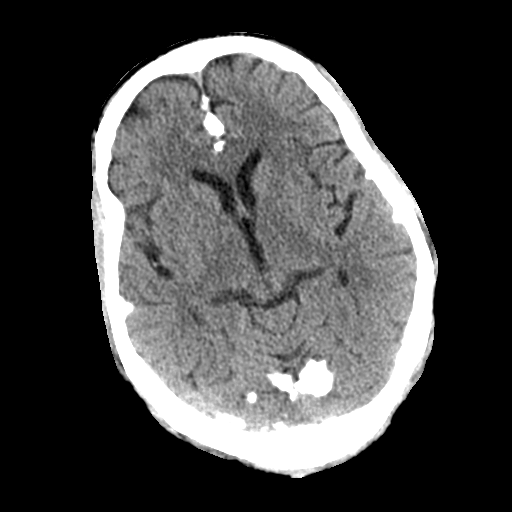
[im 14/32  bone]
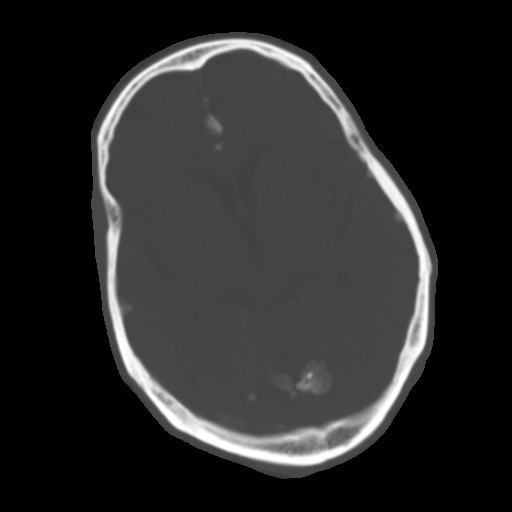
[im 16/32  brain]
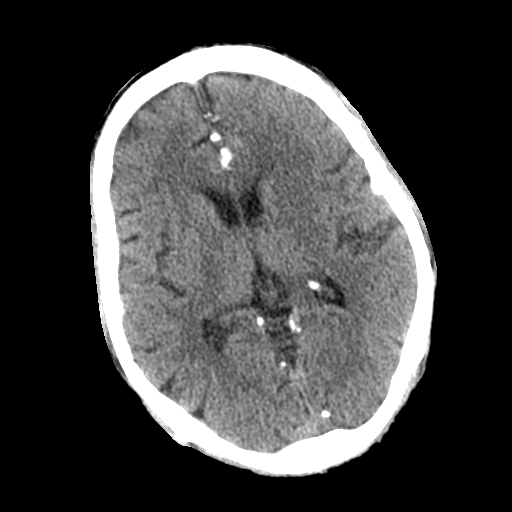
[im 18/32  brain]
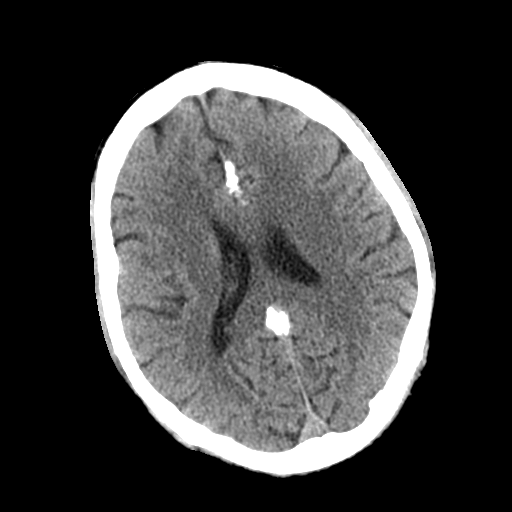
[im 20/32  brain]
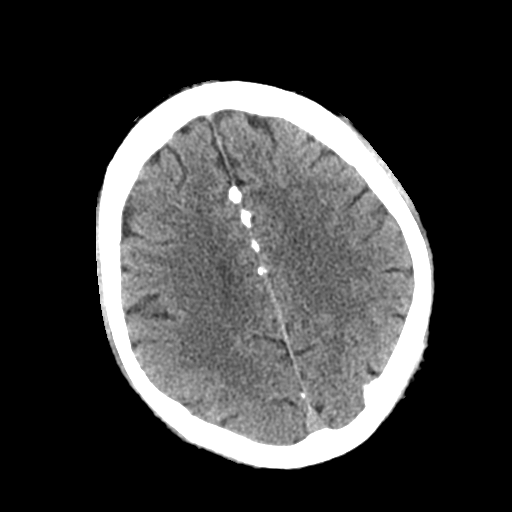
[im 25/32  brain]
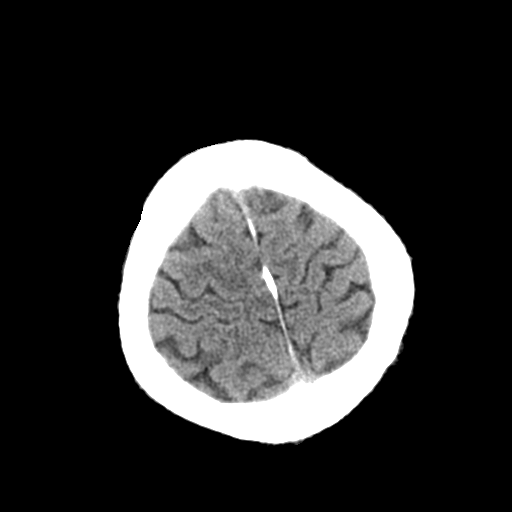
[im 25/32  bone]
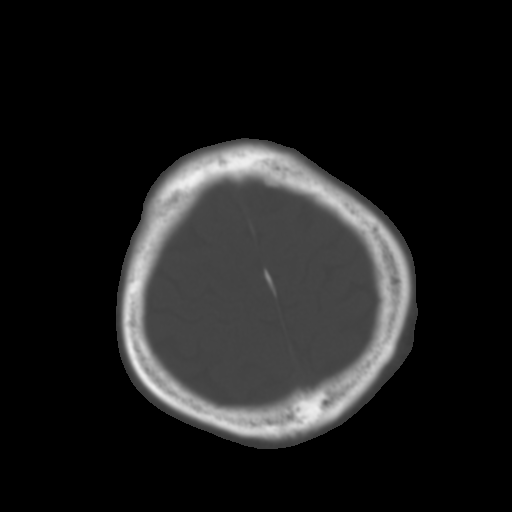
[im 27/32  brain]
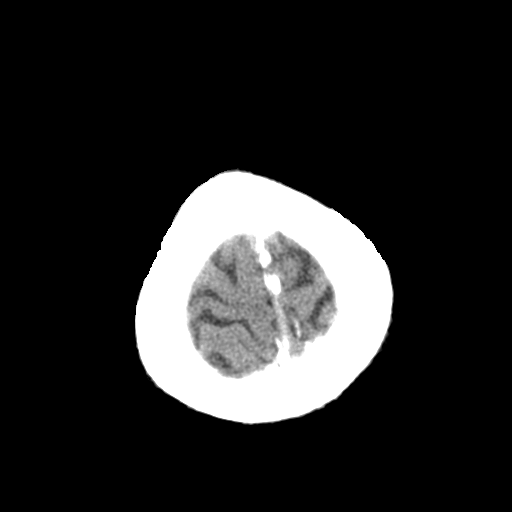
[im 29/32  brain]
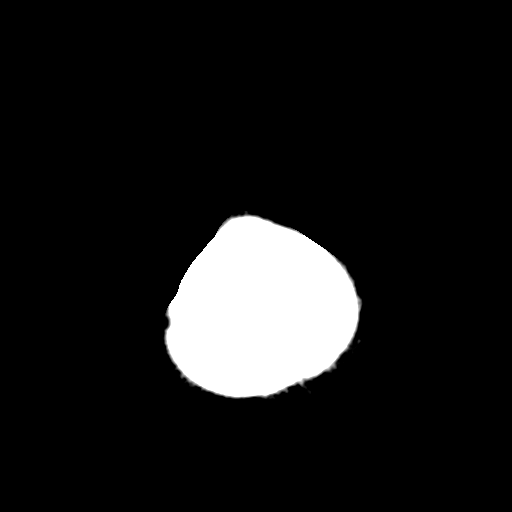

[16 of 47 positions shown; findings below may reference images not displayed]

FINDINGS: Brain: No evidence of acute infarction, hemorrhage, hydrocephalus,
extra-axial collection or mass lesion/mass effect.

Stable areas of calcification are seen throughout the falx and
tentorium.

Vascular: No hyperdense vessel or unexpected calcification.

Skull: Normal. Negative for fracture or focal lesion.

Sinuses/Orbits: There is moderate severity right ethmoid sinus
mucosal thickening.

Other: None.
IMPRESSION: 1. No acute intracranial pathology.

## 2020-11-28 MED ORDER — DEXTROSE 5 % IV SOLN: Freq: Once | INTRAVENOUS | Status: AC

## 2020-11-28 MED ORDER — LACTATED RINGERS IV SOLN: INTRAVENOUS | Status: DC

## 2020-11-28 MED ORDER — ACETAMINOPHEN 650 MG RE SUPP: 650.0000 mg | Freq: Four times a day (QID) | RECTAL | Status: DC | PRN

## 2020-11-28 MED ORDER — INSULIN ASPART 100 UNIT/ML IJ SOLN
0.0000 [IU] | Freq: Three times a day (TID) | INTRAMUSCULAR | Status: DC
  Administered 2020-11-29: 2 [IU] via SUBCUTANEOUS
  Administered 2020-11-30 (×2): 3 [IU] via SUBCUTANEOUS
  Administered 2020-11-30: 1 [IU] via SUBCUTANEOUS
  Administered 2020-12-01: 3 [IU] via SUBCUTANEOUS
  Administered 2020-12-01: 4 [IU] via SUBCUTANEOUS
  Administered 2020-12-01: 3 [IU] via SUBCUTANEOUS
  Administered 2020-12-02: 5 [IU] via SUBCUTANEOUS
  Filled 2020-11-28 (×8): qty 1

## 2020-11-28 MED ORDER — HEPARIN SODIUM (PORCINE) 5000 UNIT/ML IJ SOLN
5000.0000 [IU] | Freq: Three times a day (TID) | INTRAMUSCULAR | Status: DC
  Administered 2020-11-29 – 2020-12-04 (×14): 5000 [IU] via SUBCUTANEOUS
  Filled 2020-11-28 (×14): qty 1

## 2020-11-28 MED ORDER — HYDROCODONE-ACETAMINOPHEN 5-325 MG PO TABS: 1.0000 | ORAL_TABLET | ORAL | Status: DC | PRN

## 2020-11-28 MED ORDER — ONDANSETRON HCL 4 MG/2ML IJ SOLN: 4.0000 mg | Freq: Four times a day (QID) | INTRAMUSCULAR | Status: DC | PRN

## 2020-11-28 MED ORDER — AMLODIPINE BESYLATE 5 MG PO TABS
10.0000 mg | ORAL_TABLET | Freq: Once | ORAL | Status: AC
  Administered 2020-11-28: 10 mg via ORAL
  Filled 2020-11-28: qty 2

## 2020-11-28 MED ORDER — POLYETHYLENE GLYCOL 3350 17 G PO PACK: 17.0000 g | PACK | Freq: Every day | ORAL | Status: DC | PRN

## 2020-11-28 MED ORDER — LORAZEPAM 2 MG/ML IJ SOLN
1.0000 mg | Freq: Once | INTRAMUSCULAR | Status: AC
  Administered 2020-11-28: 1 mg via INTRAVENOUS
  Filled 2020-11-28: qty 1

## 2020-11-28 MED ORDER — DEXTROSE 50 % IV SOLN
INTRAVENOUS | Status: AC
  Administered 2020-11-28: 50 mL via INTRAVENOUS
  Filled 2020-11-28: qty 50

## 2020-11-28 MED ORDER — MORPHINE SULFATE (PF) 2 MG/ML IV SOLN
2.0000 mg | INTRAVENOUS | Status: DC | PRN
Start: 2020-11-28 — End: 2020-12-04

## 2020-11-28 MED ORDER — ACETAMINOPHEN 325 MG PO TABS
650.0000 mg | ORAL_TABLET | Freq: Four times a day (QID) | ORAL | Status: DC | PRN
  Administered 2020-12-04: 650 mg via ORAL
  Filled 2020-11-28: qty 2

## 2020-11-28 MED ORDER — ONDANSETRON HCL 4 MG PO TABS: 4.0000 mg | ORAL_TABLET | Freq: Four times a day (QID) | ORAL | Status: DC | PRN

## 2020-11-28 MED ORDER — LORAZEPAM 2 MG/ML IJ SOLN
4.0000 mg | INTRAMUSCULAR | Status: AC | PRN
Start: 2020-11-28 — End: 2020-11-29
  Administered 2020-11-29 (×2): 4 mg via INTRAVENOUS
  Filled 2020-11-28 (×2): qty 2

## 2020-11-28 MED ORDER — LACTATED RINGERS IV BOLUS
1000.0000 mL | Freq: Once | INTRAVENOUS | Status: AC
  Administered 2020-11-28: 1000 mL via INTRAVENOUS

## 2020-11-28 MED ORDER — DEXTROSE 50 % IV SOLN: 1.0000 | Freq: Once | INTRAVENOUS | Status: AC

## 2020-11-28 MED ORDER — HALOPERIDOL LACTATE 5 MG/ML IJ SOLN
5.0000 mg | Freq: Once | INTRAMUSCULAR | Status: AC
  Administered 2020-11-28: 5 mg via INTRAMUSCULAR
  Filled 2020-11-28: qty 1

## 2020-11-28 MED ORDER — PREGABALIN 75 MG PO CAPS
75.0000 mg | ORAL_CAPSULE | ORAL | Status: AC
  Administered 2020-11-28: 75 mg via ORAL
  Filled 2020-11-28: qty 1

## 2020-11-28 NOTE — H&P (Addendum)
History and Physical    Nicholas Weiss IRJ:[id number] DOB: 11/26/1950 DOA: 11/28/2020  PCP: Birdie Sons, MD  Chief Complaint: Hypoglycemia and involuntary movement  HPI: Nicholas Weiss is a 70 y.o. male with a past medical history of type 1 insulin-dependent diabetes mellitus, hypertension, hyperlipidemia, multiple myeloma and chronic leukopenia, cocaine abuse.  Patient was recently admitted in June of this year for right-sided myoclonic jerking.  The patient underwent an extensive work-up including MRI of the brain and EEG which was largely unremarkable.  Neurology was consulted and it was concluded that this was related to possibly cocaine metabolites.  Upon EMS arrival for assessment of low blood sugars he was noted to have some intermittent myoclonic jerking movements that started today.  Most of the history provided by ER doctor as when I examined he is sedated after receiving Ativan and Haldol to obtain CT imaging.  Per ED provider, the patient states he sometimes gets these movements now and then.  They are much worse today.  He denies any urinary or bowel incontinence or loss of consciousness.  He denies any tongue biting.  Not on any blood thinners.  His blood sugar in the emergency department was noted to be as low as 42.  He was given an amp of D50 and started on a D5 drip.  The patient told the ED provider that he took his usual dose of insulin and ate a regular breakfast this morning. Per ED provider, he denies any chest pain, abdominal pain, nausea, vomiting, diarrhea.  He states he last used cocaine on Monday.  Denies any other illicit drug use or alcohol use.  Upon my exam he is still having myoclonic jerking primarily of the upper extremities bilaterally.  He required sedation with Ativan and Haldol by the ED provider to obtain CT imaging.  ED Course: Chest was negative.  CT C-spine showed no acute findings.  His creatinine is elevated at 2.29.  Review of his chart shows a  baseline creatinine 1.4-1.8.  Repeat blood sugar in the 130s.  WBC at 2.48 hemoglobin at 10 and hematocrit 29.  Vital signs are stable.  Review of Systems: Unable to obtain 14 point review of systems due to him being sedated   Past Medical History:  Diagnosis Date   DKA (diabetic ketoacidoses) 04/06/2016   Hypercholesteremia    Hypertension     Past Surgical History:  Procedure Laterality Date   COLONOSCOPY WITH PROPOFOL N/A 02/01/2020   Procedure: COLONOSCOPY WITH PROPOFOL;  Surgeon: Lin Landsman, MD;  Location: [facility] ENDOSCOPY;  Service: Gastroenterology;  Laterality: N/A;   ESOPHAGOGASTRODUODENOSCOPY  02/01/2020   Procedure: ESOPHAGOGASTRODUODENOSCOPY (EGD);  Surgeon: Lin Landsman, MD;  Location: [facility] ENDOSCOPY;  Service: Gastroenterology;;   KNEE SURGERY Right    Torn meniscus   KNEE SURGERY Left     Social History   Socioeconomic History   Marital status: Single    Spouse name: Not on file   Number of children: 3   Years of education: Not on file   Highest education level: High school graduate  Occupational History    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  Tobacco Use   Smoking status: Never   Smokeless tobacco: Never  Vaping Use   Vaping Use: Never used  Substance and Sexual Activity   Alcohol use: Yes    Alcohol/week: 0.0 - 1.0 standard drinks    Comment: "once every 2 months"   Drug use: Not Currently    Types: Marijuana  Comment: "none in a long time'   Sexual activity: Yes    Birth control/protection: None  Other Topics Concern   Not on file  Social History Narrative   Not on file   Social Determinants of Health   Financial Resource Strain: Low Risk    Difficulty of Paying Living Expenses: Not hard at all  Food Insecurity: No Food Insecurity   Worried About Charity fundraiser in the Last Year: Never true   [place] in the Last Year: Never true  Transportation Needs: No Transportation Needs   Lack of Transportation (Medical): No   Lack  of Transportation (Non-Medical): No  Physical Activity: Insufficiently Active   Days of Exercise per Week: 7 days   Minutes of Exercise per Session: 20 min  Stress: No Stress Concern Present   Feeling of Stress : Not at all  Social Connections: Moderately Isolated   Frequency of Communication with Friends and Family: More than three times a week   Frequency of Social Gatherings with Friends and Family: More than three times a week   Attends Religious Services: Never   Marine scientist or Organizations: No   Attends Music therapist: Never   Marital Status: Living with partner  Intimate Partner Violence: Not At Risk   Fear of Current or Ex-Partner: No   Emotionally Abused: No   Physically Abused: No   Sexually Abused: No    Allergies  Allergen Reactions   Penicillins Other (See Comments)    Has patient had a PCN reaction causing immediate rash, facial/tongue/throat swelling, SOB or lightheadedness with hypotension: No Has patient had a PCN reaction causing severe rash involving mucus membranes or skin necrosis: No Has patient had a PCN reaction that required hospitalization No Has patient had a PCN reaction occurring within the last 10 years: No If all of the above answers are "NO", then may proceed with Cephalosporin use.     Family History  Problem Relation Age of Onset   Heart attack Father    Hypertension Sister    Cancer Sister     Prior to Admission medications   Medication Sig Start Date End Date Taking? Authorizing Provider  Alcohol Swabs (B-D SINGLE USE SWABS REGULAR) PADS Use to check blood sugar four times daily for type 1 diabetes 07/11/20   Birdie Sons, MD  amLODipine (NORVASC) 10 MG tablet Take 0.5 tablets (5 mg total) by mouth daily. 10/02/20   Birdie Sons, MD  aspirin 81 MG chewable tablet Chew 1 tablet by mouth daily.    [provider]  Blood Glucose Calibration (TRUE METRIX LEVEL 1) Low SOLN Use to check blood sugar four  times daily for type 1 diabetes 07/11/20   Birdie Sons, MD  insulin aspart (NOVOLOG) 100 UNIT/ML injection Inject up to 14 units three times daily before meals according to sliding scale 06/25/20   Birdie Sons, MD  insulin degludec (TRESIBA FLEXTOUCH) 100 UNIT/ML FlexTouch Pen 20 units daily. 09/13/20   Lorella Nimrod, MD  Insulin Syringe-Needle U-100 (INSULIN SYRINGE 1CC/31GX5/16") 31G X 5/16" 1 ML MISC For insulin injections up to 4 times daily 07/11/20   Birdie Sons, MD  lovastatin (MEVACOR) 40 MG tablet Take 1 tablet (40 mg total) by mouth at bedtime. 08/02/18   Birdie Sons, MD  pregabalin (LYRICA) 75 MG capsule Take 1 capsule by mouth twice daily 11/08/20   Birdie Sons, MD    Physical Exam: Vitals:  11/28/20 1749 11/28/20 1827 11/28/20 1954 11/28/20 2030  BP: (!) 159/143 (!) 159/143 (!) 149/89 (!) 171/83  Pulse:  82 75 80  Resp:  14 13 20   Temp:      TempSrc:      SpO2:  100% 100% 98%  Weight:      Height:         General:  Appears calm and comfortable and is in NAD Cardiovascular:  RRR, no m/r/g.  Respiratory:   CTA bilaterally with no wheezes/rales/rhonchi.  Normal respiratory effort. Abdomen:  soft, NT, ND, NABS Skin:  no rash or induration seen on limited exam Musculoskeletal:  grossly normal tone BUE/BLE, good ROM, no bony abnormality Lower extremity:  No LE edema.  Limited foot exam with no ulcerations.  2+ distal pulses. Psychiatric: He is sedated after receiving Ativan and Haldol.  Episodic myoclonic jerking of bilateral upper extremities primarily. Neurologic:  CN 2-12 grossly intact, moves all extremities in coordinated fashion, sensation intact    Radiological Exams on Admission: Independently reviewed - see discussion in A/P where applicable  CT HEAD WO CONTRAST (5MM)  Result Date: 11/28/2020 CLINICAL DATA:  Hypoglycemia. EXAM: CT HEAD WITHOUT CONTRAST TECHNIQUE: Contiguous axial images were obtained from the base of the skull through the  vertex without intravenous contrast. COMPARISON:  September 12, 2020 FINDINGS: Brain: No evidence of acute infarction, hemorrhage, hydrocephalus, extra-axial collection or mass lesion/mass effect. Stable areas of calcification are seen throughout the falx and tentorium. Vascular: No hyperdense vessel or unexpected calcification. Skull: Normal. Negative for fracture or focal lesion. Sinuses/Orbits: There is moderate severity right ethmoid sinus mucosal thickening. Other: None. IMPRESSION: 1. No acute intracranial pathology. Electronically Signed   By: Virgina Norfolk M.D.   On: 11/28/2020 21:17   CT Cervical Spine Wo Contrast  Result Date: 11/28/2020 CLINICAL DATA:  Hypoglycemia. EXAM: CT CERVICAL SPINE WITHOUT CONTRAST TECHNIQUE: Multidetector CT imaging of the cervical spine was performed without intravenous contrast. Multiplanar CT image reconstructions were also generated. COMPARISON:  None. FINDINGS: Alignment: Normal. Skull base and vertebrae: No acute fracture. No primary bone lesion or focal pathologic process. Soft tissues and spinal canal: No prevertebral fluid or swelling. No visible canal hematoma. Disc levels: Mild endplate sclerosis and mild to moderate severity anterior osteophyte formation is seen at the levels of C5-C6 and C6-C7. Mild intervertebral disc space narrowing is seen at the levels of C5-C6 and C6-C7. Mild, bilateral multilevel facet joint hypertrophy is noted. Upper chest: Negative. Other: None. IMPRESSION: 1. No acute fracture or subluxation of the cervical spine. 2. Mild to moderate severity degenerative changes at the levels of C5-C6 and C6-C7. Electronically Signed   By: Virgina Norfolk M.D.   On: 11/28/2020 21:25     Labs on Admission: I have personally reviewed the available labs and imaging studies at the time of the admission.  Pertinent labs: WBC 2.4, hemoglobin 10, hematocrit 29, BUN 33, creatinine 2.29, blood glucose 130, initial blood glucose  42   Assessment/Plan Persistent hyperglycemia: Given 1 amp of D50 in the emergency department now on a D5 drip.  Start Accu-Cheks every 2 hours.  If blood sugars start rising discontinue the D5 drip.  We will hold home insulin regimen including Tresiba and NovoLog sliding scale insulin before meals.  Currently NPO.  Type 1 insulin-dependent diabetes mellitus: Holding home [name] and NovoLog sliding scale insulin before meals  Hypertension: Continue home amlodipine  Chronic leukopenia: No acute treatment  Hyperlipidemia: Continue home lovastatin  Neuropathy: Continue home Lyrica  Cocaine use: Counsel on cessation  Acute kidney injury on chronic kidney disease: Given lactated Ringer's 1 L bolus in the emergency department.  Continue lactated Ringer's with maintenance rate at 75 cc an hour. Repeat BMP in AM.  Episodic myoclonic jerking: UDS pending. Can discuss with neurology in the morning if extensive work-up should be obtained again with a EEG and MRI as he recently had similar symptomatology in June and extensive work-up was negative and was concluded that this was due to cocaine metabolites.  The patient told the ED provider last time he used cocaine was on Monday.  Ativan as needed ordered for any further episodic myoclonic jerking.  Level of Care: MedSurg DVT prophylaxis: Heparin subcu Code Status: Full code Consults: None Admission status: Observation   Leslee Home DO [facility]   How to contact the [facility] Attending or Consulting provider [place] or covering provider during after hours [place], for this patient?  Check the care team in [facility] and look for a) attending/consulting [facility] provider listed and b) the [facility] team listed Log into [email or web address] and use [place]'s universal password to access. If you do not have the password, please contact the hospital operator. Locate the [facility] provider you are looking for under [facility] and page to a number that you can  be directly reached. If you still have difficulty reaching the provider, please page the [facility] (Director on Call) for the Hospitalists listed on amion for assistance.   11/28/2020, 10:09 PM

## 2020-11-28 NOTE — ED Triage Notes (Signed)
Pt comes into the ED via [facility] from home c/o hypoglycemia with family reading in the 40's.  Fire gave him 1 round of glucagon and orange juice and last CBG was 87.  Pt also presents with involuntary movements of the face and arms.  Pt states he has had this in the past but never as consistent as this.  Pt neurologically intact at this time and is able to answer all questions.  Pt states he only takes BP medications and Lyrica.

## 2020-11-28 NOTE — ED Provider Notes (Signed)
[facility] Emergency Department Provider Note  ____________________________________________   Event Date/Time   First MD Initiated Contact with Patient 11/28/20 1742     (approximate)  I have reviewed the triage vital signs and the nursing notes.   HISTORY  Chief Complaint Hypoglycemia and Involuntary movements   HPI Nicholas Weiss is a 70 y.o. male with a past medical history of HTN, HDL, multiple myeloma and chronic leukopenia, type 1 insulin-dependent diabetes and recent admission 6/15-6/17 for right-sided myoclonic jerking thought to be secondary to assuming possible cocaine metabolites after he underwent extensive work-up including MRI brain and EEG who presents via EMS from home for assessment of low blood sugars and some intermittent tolerating jerking movements that he states started today.  He states he will sometimes get these movements now and then but they are much worse today and actually caused him to fall and hit his head.  He did not have any incontinence or OC.  States he is not any blood thinners.  He states he is not sure why his sugars were low as he states he took his usual dose of insulin and ate a regular breakfast.  He was not aware that reportedly in the 40s per triage note from EMS.  He denies any acute chest pain, abdominal pain, nausea, vomiting, diarrhea, dysuria, rash or recent injuries or falls.  He states he last used cocaine 4 days ago.  Denies any today or illicit drug use or EtOH use.         Past Medical History:  Diagnosis Date   DKA (diabetic ketoacidoses) 04/06/2016   Hypercholesteremia    Hypertension     Patient Active Problem List   Diagnosis Date Noted   Muscle twitching 09/12/2020   Smoldering multiple myeloma ([place]) 08/02/2020   Pre-syncope 08/01/2020   Heart block AV first degree 08/01/2020   Anemia of chronic kidney failure, stage 3 (moderate) ([place]) 05/28/2020   PUD (peptic ulcer disease)    Esophageal  dysphagia    Encounter for screening colonoscopy    Hyperglycemia due to type 2 diabetes mellitus ([place]) 07/28/2019   Generalized weakness 07/28/2019   Type 2 diabetes mellitus with hyperglycemia ([place]) 07/28/2019   Hypoglycemia 04/27/2019   Unresponsiveness 04/27/2019   Lung nodule 04/07/2019   Uncontrolled type 1 diabetes mellitus ([place])    Acute renal failure with acute tubular necrosis superimposed on stage 3a chronic kidney disease ([employer])    Hypertensive urgency 04/05/2019   AKI (acute kidney injury) ([place]) 04/05/2019   CKD (chronic kidney disease) stage 3, GFR 30-59 ml/min ([place]) 04/05/2019   DKA (diabetic ketoacidoses) 04/06/2016   Hypercholesterolemia 01/24/2016   Essential (primary) hypertension 06/07/2006   Type 1 diabetes mellitus with diabetic neuropathy, unspecified ([employer] [place]) 05/31/2006    Past Surgical History:  Procedure Laterality Date   COLONOSCOPY WITH PROPOFOL N/A 02/01/2020   Procedure: COLONOSCOPY WITH PROPOFOL;  Surgeon: Lin Landsman, MD;  Location: [facility] ENDOSCOPY;  Service: Gastroenterology;  Laterality: N/A;   ESOPHAGOGASTRODUODENOSCOPY  02/01/2020   Procedure: ESOPHAGOGASTRODUODENOSCOPY (EGD);  Surgeon: Lin Landsman, MD;  Location[facility] ENDOSCOPY;  Service: Gastroenterology;;   KNEE SURGERY Right    Torn meniscus   KNEE SURGERY Left     Prior to Admission medications   Medication Sig Start Date End Date Taking? Authorizing Provider  Alcohol Swabs (B-D SINGLE USE SWABS REGULAR) PADS Use to check blood sugar four times daily for type 1 diabetes 07/11/20   Birdie Sons, MD  amLODipine ([place])  10 MG tablet Take 0.5 tablets (5 mg total) by mouth daily. 10/02/20   Birdie Sons, MD  aspirin 81 MG chewable tablet Chew 1 tablet by mouth daily.    [provider]  Blood Glucose Calibration (TRUE METRIX LEVEL 1) Low SOLN Use to check blood sugar four times daily for type 1 diabetes 07/11/20   Birdie Sons, MD  insulin aspart (NOVOLOG) 100 UNIT/ML  injection Inject up to 14 units three times daily before meals according to sliding scale 06/25/20   Birdie Sons, MD  insulin degludec (TRESIBA FLEXTOUCH) 100 UNIT/ML FlexTouch Pen 20 units daily. 09/13/20   Lorella Nimrod, MD  Insulin Syringe-Needle U-100 (INSULIN SYRINGE 1CC/31GX5/16") 31G X 5/16" 1 ML MISC For insulin injections up to 4 times daily 07/11/20   Birdie Sons, MD  lovastatin (MEVACOR) 40 MG tablet Take 1 tablet (40 mg total) by mouth at bedtime. 08/02/18   Birdie Sons, MD  pregabalin (LYRICA) 75 MG capsule Take 1 capsule by mouth twice daily 11/08/20   Birdie Sons, MD    Allergies Penicillins  Family History  Problem Relation Age of Onset   Heart attack Father    Hypertension Sister    Cancer Sister     Social History Social History   Tobacco Use   Smoking status: Never   Smokeless tobacco: Never  Vaping Use   Vaping Use: Never used  Substance Use Topics   Alcohol use: Yes    Alcohol/week: 0.0 - 1.0 standard drinks    Comment: "once every 2 months"   Drug use: Not Currently    Types: Marijuana    Comment: "none in a long time'    Review of Systems  Review of Systems  Constitutional:  Negative for chills and fever.  HENT:  Negative for sore throat.   Eyes:  Negative for pain.  Respiratory:  Negative for cough and stridor.   Cardiovascular:  Negative for chest pain.  Gastrointestinal:  Negative for vomiting.  Genitourinary:  Negative for dysuria.  Musculoskeletal:  Positive for falls.  Skin:  Negative for rash.  Neurological:  Positive for tremors and headaches. Negative for seizures and loss of consciousness.  Psychiatric/Behavioral:  Negative for suicidal ideas.   All other systems reviewed and are negative.    ____________________________________________   PHYSICAL EXAM:  VITAL SIGNS: ED Triage Vitals  Enc Vitals Group     BP 11/28/20 1415 (!) 204/90     Pulse Rate 11/28/20 1415 81     Resp 11/28/20 1415 18     Temp 11/28/20  1415 98.9 F (37.2 C)     Temp Source 11/28/20 1415 Oral     SpO2 11/28/20 1415 99 %     Weight 11/28/20 1416 179 lb (81.2 kg)     Height 11/28/20 1416 5' 10.5" (1.791 m)     Head Circumference --      Peak Flow --      Pain Score 11/28/20 1416 0     Pain Loc --      Pain Edu? --      Excl. in [place]? --    Vitals:   11/28/20 2100 11/28/20 2130  BP: (!) 152/87 (!) 169/96  Pulse: 76 74  Resp: 10 10  Temp:    SpO2: 94% 100%   Physical Exam Vitals and nursing note reviewed.  Constitutional:      Appearance: He is well-developed.  HENT:     Head: Normocephalic and atraumatic.  Eyes:     Conjunctiva/sclera: Conjunctivae normal.  Cardiovascular:     Rate and Rhythm: Normal rate and regular rhythm.     Heart sounds: No murmur heard. Pulmonary:     Effort: Pulmonary effort is normal. No respiratory distress.     Breath sounds: Normal breath sounds.  Abdominal:     Palpations: Abdomen is soft.     Tenderness: There is no abdominal tenderness.  Musculoskeletal:     Cervical back: Neck supple.  Skin:    General: Skin is warm and dry.  Neurological:     Mental Status: He is alert and oriented to person, place, and time.     Motor: Tremor present.     Comments: Patient has apparent myoclonic jerking of both extremities and his jaw.  This does not seem obviously suppressible.  PERRLA.  EOMI.  He does have symmetric grip strength in his upper and lower extremities.  Sensation is intact to light touch all extremities.     ____________________________________________   LABS (all labs ordered are listed, but only abnormal results are displayed)  Labs Reviewed  BASIC METABOLIC PANEL - Abnormal; Notable for the following components:      Result Value   Glucose, Bld 130 (*)    BUN 33 (*)    Creatinine, Ser 2.29 (*)    Calcium 8.4 (*)    GFR, Estimated 31 (*)    All other components within normal limits  CBC - Abnormal; Notable for the following components:   WBC 2.4 (*)    RBC  3.23 (*)    Hemoglobin 10.1 (*)    HCT 29.8 (*)    Platelets 138 (*)    All other components within normal limits  CBC WITH DIFFERENTIAL/PLATELET - Abnormal; Notable for the following components:   WBC 3.0 (*)    RBC 3.25 (*)    Hemoglobin 10.0 (*)    HCT 30.4 (*)    Platelets 130 (*)    All other components within normal limits  CBG MONITORING, ED - Abnormal; Notable for the following components:   Glucose-Capillary 135 (*)    All other components within normal limits  CBG MONITORING, ED - Abnormal; Notable for the following components:   Glucose-Capillary 169 (*)    All other components within normal limits  CBG MONITORING, ED - Abnormal; Notable for the following components:   Glucose-Capillary 60 (*)    All other components within normal limits  CBG MONITORING, ED - Abnormal; Notable for the following components:   Glucose-Capillary 42 (*)    All other components within normal limits  CBG MONITORING, ED - Abnormal; Notable for the following components:   Glucose-Capillary 138 (*)    All other components within normal limits  URINALYSIS, COMPLETE (UACMP) WITH MICROSCOPIC  URINE DRUG SCREEN, QUALITATIVE ([facility] ONLY)  ETHANOL  CBC  BASIC METABOLIC PANEL   ____________________________________________  EKG ____________________________________________  RADIOLOGY  ED MD interpretation: CT head shows no evidence of skull fracture, intracranial hemorrhage, subacute CVA or other acute intracranial process.  CT C-spine shows no evidence of acute C-spine injury.  Official radiology report(s): CT HEAD WO CONTRAST (5MM)  Result Date: 11/28/2020 CLINICAL DATA:  Hypoglycemia. EXAM: CT HEAD WITHOUT CONTRAST TECHNIQUE: Contiguous axial images were obtained from the base of the skull through the vertex without intravenous contrast. COMPARISON:  September 12, 2020 FINDINGS: Brain: No evidence of acute infarction, hemorrhage, hydrocephalus, extra-axial collection or mass lesion/mass effect.  Stable areas of calcification are seen throughout the  falx and tentorium. Vascular: No hyperdense vessel or unexpected calcification. Skull: Normal. Negative for fracture or focal lesion. Sinuses/Orbits: There is moderate severity right ethmoid sinus mucosal thickening. Other: None. IMPRESSION: 1. No acute intracranial pathology. Electronically Signed   By: Virgina Norfolk M.D.   On: 11/28/2020 21:17   CT Cervical Spine Wo Contrast  Result Date: 11/28/2020 CLINICAL DATA:  Hypoglycemia. EXAM: CT CERVICAL SPINE WITHOUT CONTRAST TECHNIQUE: Multidetector CT imaging of the cervical spine was performed without intravenous contrast. Multiplanar CT image reconstructions were also generated. COMPARISON:  None. FINDINGS: Alignment: Normal. Skull base and vertebrae: No acute fracture. No primary bone lesion or focal pathologic process. Soft tissues and spinal canal: No prevertebral fluid or swelling. No visible canal hematoma. Disc levels: Mild endplate sclerosis and mild to moderate severity anterior osteophyte formation is seen at the levels of C5-C6 and C6-C7. Mild intervertebral disc space narrowing is seen at the levels of C5-C6 and C6-C7. Mild, bilateral multilevel facet joint hypertrophy is noted. Upper chest: Negative. Other: None. IMPRESSION: 1. No acute fracture or subluxation of the cervical spine. 2. Mild to moderate severity degenerative changes at the levels of C5-C6 and C6-C7. Electronically Signed   By: Virgina Norfolk M.D.   On: 11/28/2020 21:25    ____________________________________________   PROCEDURES  Procedure(s) performed (including Critical Care):  .1-3 Lead EKG Interpretation  Date/Time: 11/28/2020 10:56 PM Performed by: Lucrezia Starch, MD Authorized by: Lucrezia Starch, MD     Interpretation: normal     ECG rate assessment: normal     Rhythm: sinus rhythm     Ectopy: none     Conduction: normal     ____________________________________________   INITIAL IMPRESSION /  ASSESSMENT AND PLAN / ED COURSE      Patient presents with above-stated history exam for assessment of significant jerking movements seemingly started today causing patient to fall.  Patient was also reportedly hypoglycemic with the family versus EMS reading in the 58s.  He did receive some glucagon and orange juice with EMS.  On arrival he states he does not remember this but otherwise alert and oriented and while jerking in all extremities and his jaw has relatively nonfocal supine neuro exam.  No obvious trauma on exam.  Given report of fall with patient stating he hit his head and age over 49 and abnormal movements CT head and C-spine obtained.  CT head shows no evidence of skull fracture, intracranial hemorrhage, subacute CVA or other acute intracranial process.  CT C-spine shows no evidence of acute C-spine injury.  Unclear if these movements are related to metabolic derangement versus sympathomimetic ingestion.  Do not seem epileptic on my assessment of the he was given some Ativan and Haldol which seemed to help.   BMP shows no significant electrolyte or metabolic derangements.  CBC shows WBC count of 2.4 without acute anemia and platelets of 138.  While undergoing initial ED work-up patient's sugar was noted to decrease to 60.  He was placed on a D5 drip.  Blood sugar 1 hour later was 42.  He was given a bolus a D50 bolus and subsequently started with a sugar of 138.  At this point patient is known to be sleeping and I suspect somewhat sedated from his Ativan and Haldol which initially required to suppress his jerking obtain CDs.  Given labile sugars and uncontrolled movements while awake preventing patient from ambulating safely will admit to medicine service for observation.  I suspect this may been a recurrence  of episode that occurred 2 months ago with patient endorsing recent cocaine use and possibly experiencing aftereffects of some metabolites.  I will admit to medicine service for further  evaluation and management.    ____________________________________________   FINAL CLINICAL IMPRESSION(S) / ED DIAGNOSES  Final diagnoses:  Abnormal movements  Hypoglycemia  Cocaine abuse (HCC)    Medications  heparin injection 5,000 Units (5,000 Units Subcutaneous Patient Refused/Not Given 11/28/20 2238)  acetaminophen (TYLENOL) tablet 650 mg (has no administration in time range)    Or  acetaminophen (TYLENOL) suppository 650 mg (has no administration in time range)  HYDROcodone-acetaminophen (NORCO/VICODIN) 5-325 MG per tablet 1-2 tablet (has no administration in time range)  morphine 2 MG/ML injection 2 mg (has no administration in time range)  polyethylene glycol (MIRALAX / GLYCOLAX) packet 17 g (has no administration in time range)  ondansetron (ZOFRAN) tablet 4 mg (has no administration in time range)    Or  ondansetron (ZOFRAN) injection 4 mg (has no administration in time range)  insulin aspart (novoLOG) injection 0-6 Units (has no administration in time range)  LORazepam (ATIVAN) injection 1 mg (1 mg Intravenous Given 11/28/20 1804)  pregabalin (LYRICA) capsule 75 mg (75 mg Oral Given 11/28/20 1806)  amLODipine (NORVASC) tablet 10 mg (10 mg Oral Given 11/28/20 1805)  lactated ringers bolus 1,000 mL (0 mLs Intravenous Stopped 11/28/20 1927)  haloperidol lactate (HALDOL) injection 5 mg (5 mg Intramuscular Given 11/28/20 1905)  LORazepam (ATIVAN) injection 1 mg (1 mg Intravenous Given 11/28/20 1905)  dextrose 5 % solution ( Intravenous Stopped 11/28/20 2019)  dextrose 50 % solution 50 mL (50 mLs Intravenous Given 11/28/20 2019)     ED Discharge Orders     None        Note:  This document was prepared using Dragon voice recognition software and may include unintentional dictation errors.    Lucrezia Starch, MD 11/28/20 [phone]

## 2020-11-29 ENCOUNTER — Observation Stay: Payer: Medicare (Managed Care)

## 2020-11-29 ENCOUNTER — Encounter: Payer: Self-pay | Admitting: Oncology

## 2020-11-29 DIAGNOSIS — E1040 Type 1 diabetes mellitus with diabetic neuropathy, unspecified: Secondary | ICD-10-CM | POA: Diagnosis present

## 2020-11-29 DIAGNOSIS — E871 Hypo-osmolality and hyponatremia: Secondary | ICD-10-CM | POA: Diagnosis present

## 2020-11-29 DIAGNOSIS — E10649 Type 1 diabetes mellitus with hypoglycemia without coma: Secondary | ICD-10-CM | POA: Diagnosis present

## 2020-11-29 DIAGNOSIS — D696 Thrombocytopenia, unspecified: Secondary | ICD-10-CM | POA: Diagnosis present

## 2020-11-29 DIAGNOSIS — Z8711 Personal history of peptic ulcer disease: Secondary | ICD-10-CM | POA: Diagnosis not present

## 2020-11-29 DIAGNOSIS — R259 Unspecified abnormal involuntary movements: Secondary | ICD-10-CM | POA: Diagnosis not present

## 2020-11-29 DIAGNOSIS — E162 Hypoglycemia, unspecified: Secondary | ICD-10-CM | POA: Diagnosis not present

## 2020-11-29 DIAGNOSIS — D631 Anemia in chronic kidney disease: Secondary | ICD-10-CM | POA: Diagnosis present

## 2020-11-29 DIAGNOSIS — Z79899 Other long term (current) drug therapy: Secondary | ICD-10-CM | POA: Diagnosis not present

## 2020-11-29 DIAGNOSIS — Z794 Long term (current) use of insulin: Secondary | ICD-10-CM | POA: Diagnosis not present

## 2020-11-29 DIAGNOSIS — F05 Delirium due to known physiological condition: Secondary | ICD-10-CM | POA: Diagnosis present

## 2020-11-29 DIAGNOSIS — E1065 Type 1 diabetes mellitus with hyperglycemia: Secondary | ICD-10-CM | POA: Diagnosis present

## 2020-11-29 DIAGNOSIS — D709 Neutropenia, unspecified: Secondary | ICD-10-CM | POA: Diagnosis present

## 2020-11-29 DIAGNOSIS — C9000 Multiple myeloma not having achieved remission: Secondary | ICD-10-CM | POA: Diagnosis present

## 2020-11-29 DIAGNOSIS — G253 Myoclonus: Secondary | ICD-10-CM | POA: Diagnosis present

## 2020-11-29 DIAGNOSIS — I129 Hypertensive chronic kidney disease with stage 1 through stage 4 chronic kidney disease, or unspecified chronic kidney disease: Secondary | ICD-10-CM | POA: Diagnosis present

## 2020-11-29 DIAGNOSIS — E7800 Pure hypercholesterolemia, unspecified: Secondary | ICD-10-CM | POA: Diagnosis present

## 2020-11-29 DIAGNOSIS — N1832 Chronic kidney disease, stage 3b: Secondary | ICD-10-CM | POA: Diagnosis present

## 2020-11-29 DIAGNOSIS — N179 Acute kidney failure, unspecified: Secondary | ICD-10-CM | POA: Diagnosis present

## 2020-11-29 DIAGNOSIS — E860 Dehydration: Secondary | ICD-10-CM | POA: Diagnosis not present

## 2020-11-29 DIAGNOSIS — E1022 Type 1 diabetes mellitus with diabetic chronic kidney disease: Secondary | ICD-10-CM | POA: Diagnosis present

## 2020-11-29 DIAGNOSIS — Z20822 Contact with and (suspected) exposure to covid-19: Secondary | ICD-10-CM | POA: Diagnosis present

## 2020-11-29 DIAGNOSIS — G9341 Metabolic encephalopathy: Secondary | ICD-10-CM | POA: Diagnosis present

## 2020-11-29 DIAGNOSIS — F1410 Cocaine abuse, uncomplicated: Secondary | ICD-10-CM | POA: Diagnosis present

## 2020-11-29 DIAGNOSIS — F1210 Cannabis abuse, uncomplicated: Secondary | ICD-10-CM | POA: Diagnosis present

## 2020-11-29 LAB — GLUCOSE, CAPILLARY
Glucose-Capillary: 254 mg/dL — ABNORMAL HIGH (ref 70–99)
Glucose-Capillary: 245 mg/dL — ABNORMAL HIGH (ref 70–99)
Glucose-Capillary: 211 mg/dL — ABNORMAL HIGH (ref 70–99)
Glucose-Capillary: 149 mg/dL — ABNORMAL HIGH (ref 70–99)
Glucose-Capillary: 145 mg/dL — ABNORMAL HIGH (ref 70–99)
Glucose-Capillary: 171 mg/dL — ABNORMAL HIGH (ref 70–99)

## 2020-11-29 LAB — BLOOD GAS, ARTERIAL
Acid-Base Excess: 5.4 mmol/L — ABNORMAL HIGH (ref 0.0–2.0)
Bicarbonate: 30.5 mmol/L — ABNORMAL HIGH (ref 20.0–28.0)
FIO2: 0.21
O2 Saturation: 96.7 %
Patient temperature: 37
pCO2 arterial: 46 mmHg (ref 32.0–48.0)
pH, Arterial: 7.43 (ref 7.350–7.450)
pO2, Arterial: 85 mmHg (ref 83.0–108.0)

## 2020-11-29 LAB — MAGNESIUM: Magnesium: 1.8 mg/dL (ref 1.7–2.4)

## 2020-11-29 LAB — URINALYSIS, COMPLETE (UACMP) WITH MICROSCOPIC
Bacteria, UA: NONE SEEN
Bilirubin Urine: NEGATIVE
Glucose, UA: 50 mg/dL — AB
Ketones, ur: NEGATIVE mg/dL
Leukocytes,Ua: NEGATIVE
Nitrite: NEGATIVE
Protein, ur: NEGATIVE mg/dL
Specific Gravity, Urine: 1.008 (ref 1.005–1.030)
Squamous Epithelial / HPF: NONE SEEN (ref 0–5)
WBC, UA: NONE SEEN WBC/hpf (ref 0–5)
pH: 6 (ref 5.0–8.0)

## 2020-11-29 LAB — URINE DRUG SCREEN, QUALITATIVE (ARMC ONLY)
Amphetamines, Ur Screen: NOT DETECTED
Barbiturates, Ur Screen: NOT DETECTED
Benzodiazepine, Ur Scrn: NOT DETECTED
Cannabinoid 50 Ng, Ur ~~LOC~~: POSITIVE — AB
Cocaine Metabolite,Ur ~~LOC~~: POSITIVE — AB
MDMA (Ecstasy)Ur Screen: NOT DETECTED
Methadone Scn, Ur: NOT DETECTED
Opiate, Ur Screen: NOT DETECTED
Phencyclidine (PCP) Ur S: NOT DETECTED
Tricyclic, Ur Screen: NOT DETECTED

## 2020-11-29 LAB — CBG MONITORING, ED
Glucose-Capillary: 52 mg/dL — ABNORMAL LOW (ref 70–99)
Glucose-Capillary: 114 mg/dL — ABNORMAL HIGH (ref 70–99)
Glucose-Capillary: 145 mg/dL — ABNORMAL HIGH (ref 70–99)
Glucose-Capillary: 130 mg/dL — ABNORMAL HIGH (ref 70–99)
Glucose-Capillary: 144 mg/dL — ABNORMAL HIGH (ref 70–99)
Glucose-Capillary: 147 mg/dL — ABNORMAL HIGH (ref 70–99)
Glucose-Capillary: 153 mg/dL — ABNORMAL HIGH (ref 70–99)
Glucose-Capillary: 182 mg/dL — ABNORMAL HIGH (ref 70–99)
Glucose-Capillary: 234 mg/dL — ABNORMAL HIGH (ref 70–99)

## 2020-11-29 LAB — PHOSPHORUS: Phosphorus: 2.9 mg/dL (ref 2.5–4.6)

## 2020-11-29 LAB — ETHANOL: Alcohol, Ethyl (B): <10 mg/dL (ref <10)

## 2020-11-29 LAB — FOLATE: Folate: 15.6 ng/mL (ref >5.9)

## 2020-11-29 LAB — BASIC METABOLIC PANEL
Anion gap: 5 (ref 5–15)
BUN: 24 mg/dL — ABNORMAL HIGH (ref 8–23)
CO2: 25 mmol/L (ref 22–32)
Calcium: 8.1 mg/dL — ABNORMAL LOW (ref 8.9–10.3)
Chloride: 101 mmol/L (ref 98–111)
Creatinine, Ser: 1.34 mg/dL — ABNORMAL HIGH (ref 0.61–1.24)
GFR, Estimated: 58 mL/min — ABNORMAL LOW (ref >60)
Glucose, Bld: 370 mg/dL — ABNORMAL HIGH (ref 70–99)
Potassium: 4.2 mmol/L (ref 3.5–5.1)
Sodium: 131 mmol/L — ABNORMAL LOW (ref 135–145)

## 2020-11-29 LAB — CBC
HCT: 33.7 % — ABNORMAL LOW (ref 39.0–52.0)
Hemoglobin: 11.2 g/dL — ABNORMAL LOW (ref 13.0–17.0)
MCH: 30.6 pg (ref 26.0–34.0)
MCHC: 33.2 g/dL (ref 30.0–36.0)
MCV: 92.1 fL (ref 80.0–100.0)
Platelets: 136 10*3/uL — ABNORMAL LOW (ref 150–400)
RBC: 3.66 MIL/uL — ABNORMAL LOW (ref 4.22–5.81)
RDW: 14.1 % (ref 11.5–15.5)
WBC: 2.4 10*3/uL — ABNORMAL LOW (ref 4.0–10.5)
nRBC: 0 % (ref 0.0–0.2)

## 2020-11-29 LAB — AMMONIA: Ammonia: 13 umol/L (ref 9–35)

## 2020-11-29 LAB — IRON AND TIBC
Iron: 62 ug/dL (ref 45–182)
Saturation Ratios: 29 % (ref 17.9–39.5)
TIBC: 214 ug/dL — ABNORMAL LOW (ref 250–450)
UIBC: 152 ug/dL

## 2020-11-29 LAB — VITAMIN B12: Vitamin B-12: 840 pg/mL (ref 180–914)

## 2020-11-29 LAB — RESP PANEL BY RT-PCR (FLU A&B, COVID) ARPGX2
Influenza A by PCR: NEGATIVE
Influenza B by PCR: NEGATIVE
SARS Coronavirus 2 by RT PCR: NEGATIVE

## 2020-11-29 LAB — VITAMIN D 25 HYDROXY (VIT D DEFICIENCY, FRACTURES): Vit D, 25-Hydroxy: 46.81 ng/mL (ref 30–100)

## 2020-11-29 IMAGING — CT CT HEAD W/O CM
4 series · 15 of 47 positions shown, 17 images · non-contrast
Comparison: Head CT [DATE] and MRI [DATE]

CLINICAL DATA: Head trauma.  Hypoglycemia and involuntary movement.

EXAM:
CT HEAD WITHOUT CONTRAST
TECHNIQUE: Contiguous axial images were obtained from the base of the skull
through the vertex without intravenous contrast.

[Series 2: head wo · axial · 0.42mm/px · z∈[-137,-17]mm · 7 of 33 slices shown, 9 images]
[im 5/33  brain]
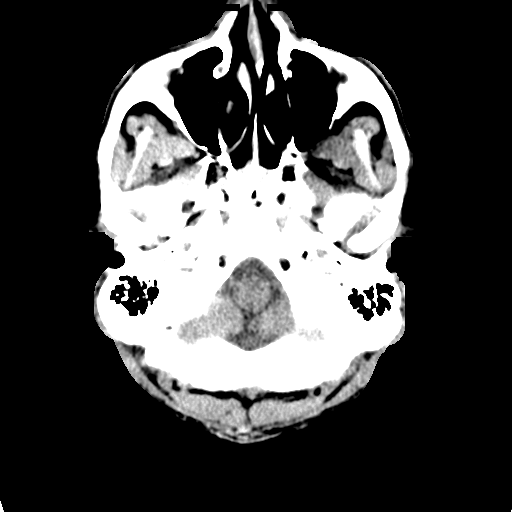
[im 5/33  bone]
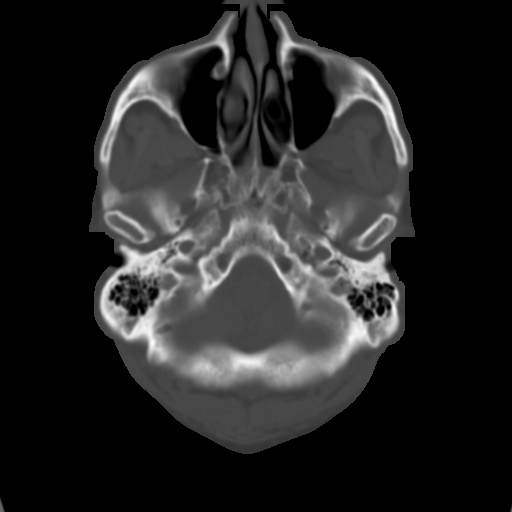
[im 9/33  brain]
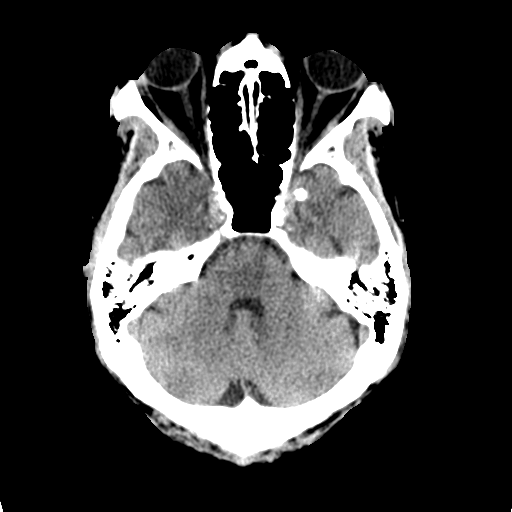
[im 13/33  brain]
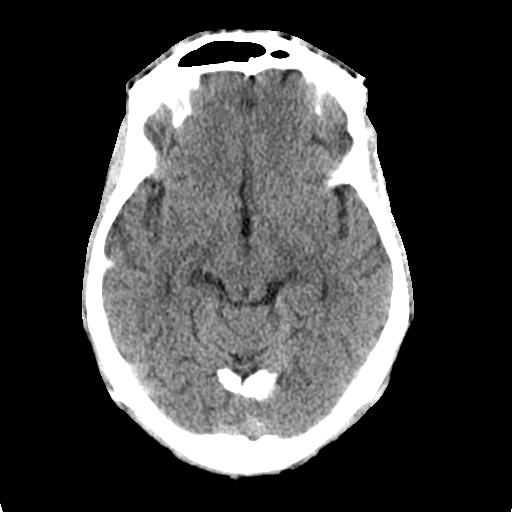
[im 17/33  brain]
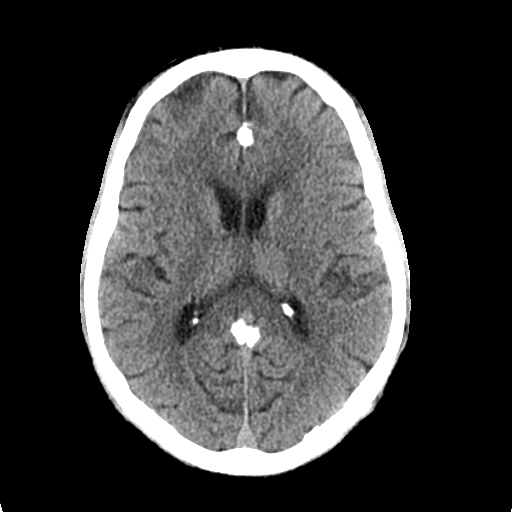
[im 21/33  brain]
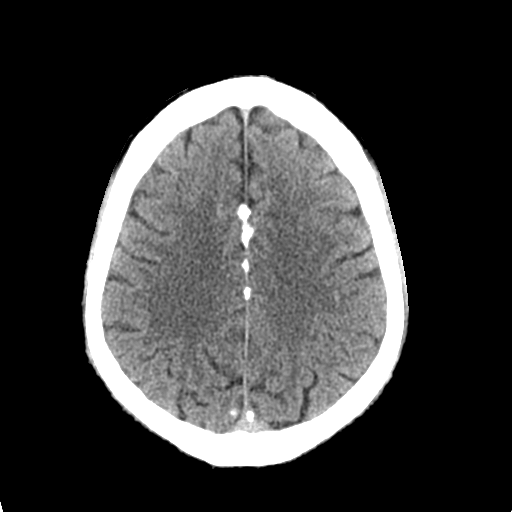
[im 21/33  bone]
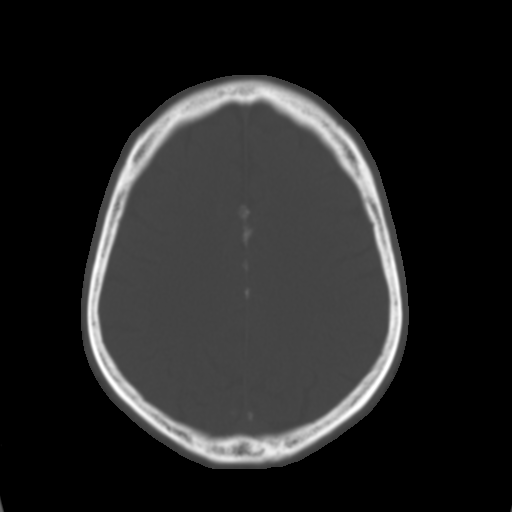
[im 25/33  brain]
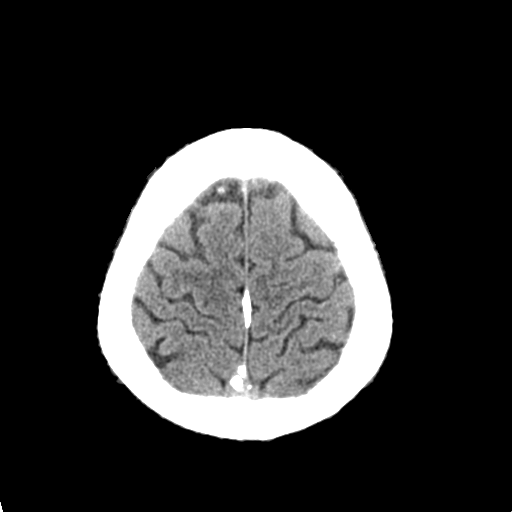
[im 29/33  brain]
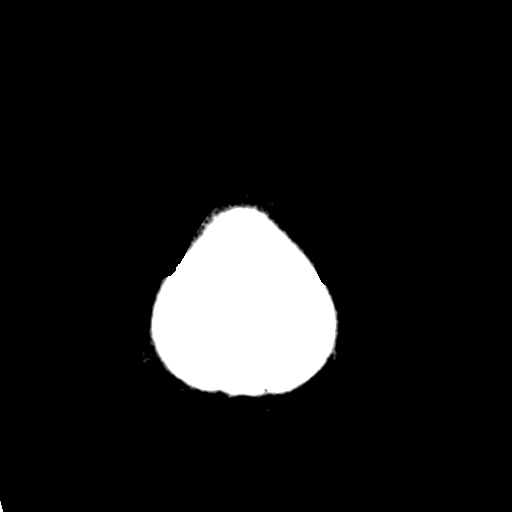

[Series 3: head bone · axial · 0.42mm/px · z∈[-141,-125]mm · 2 of 82 slices shown]
[im 9/82  bone]
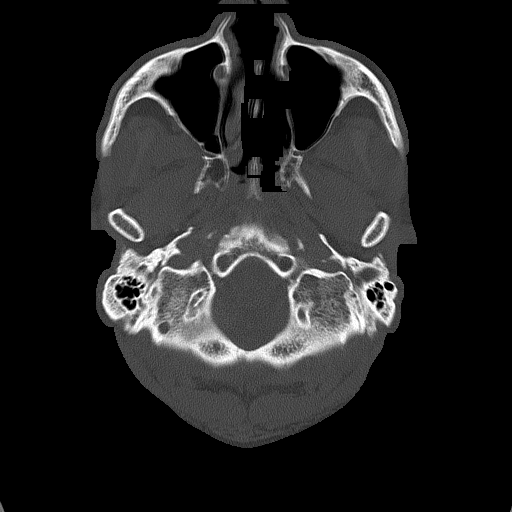
[im 17/82  bone]
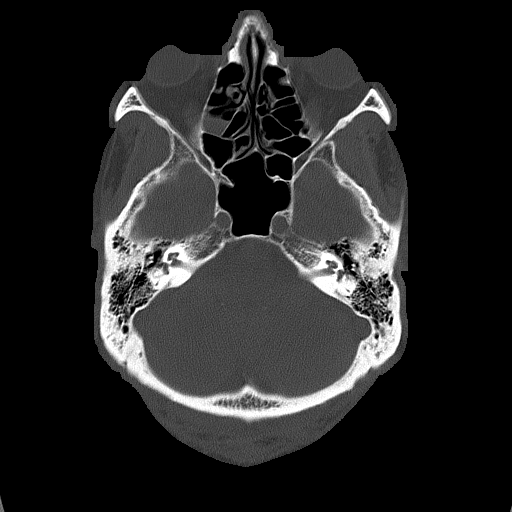

[Series 4: coronal soft tissue · coronal · 0.29mm/px · 3 of 75 slices shown]
[im 25/75  brain]
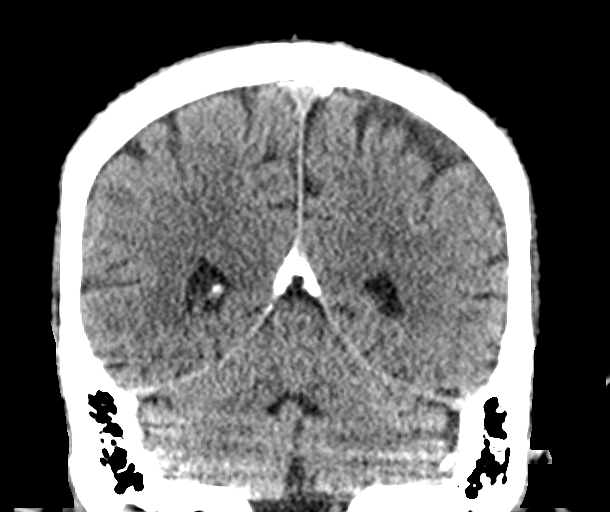
[im 33/75  brain]
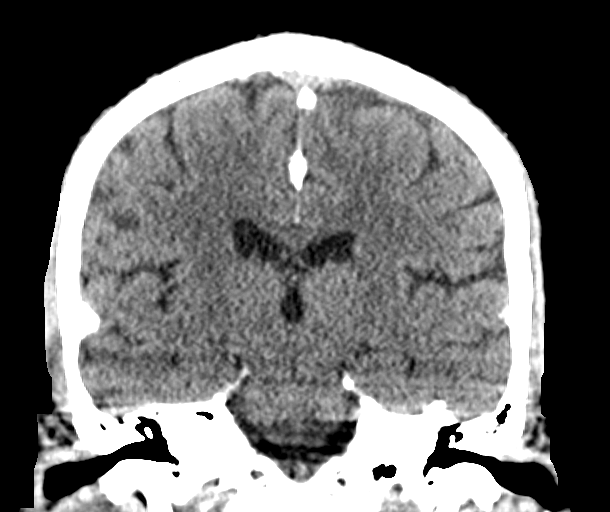
[im 42/75  brain]
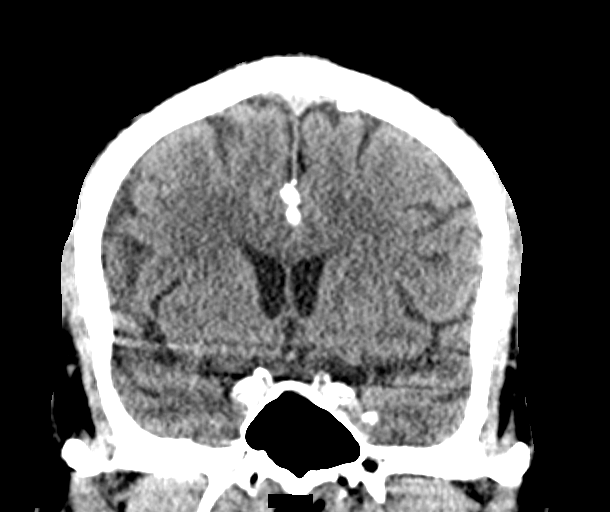

[Series 5: sagittal soft tissue · sagittal · 0.29mm/px · 3 of 58 slices shown]
[im 20/58  brain]
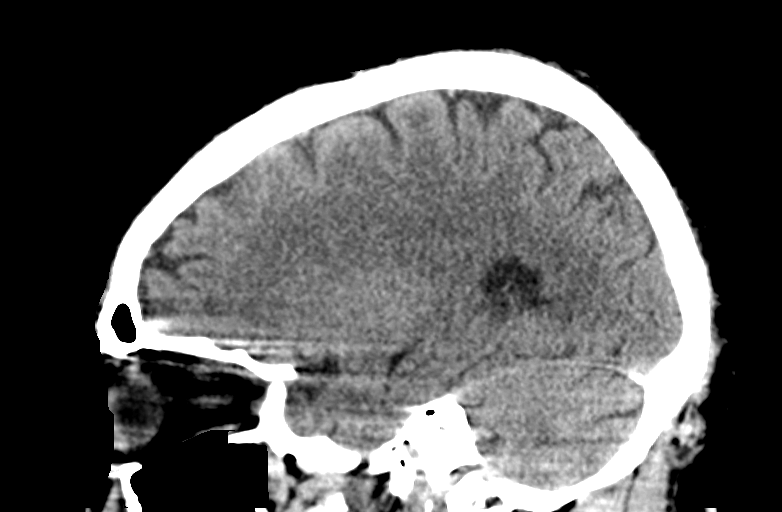
[im 29/58  brain]
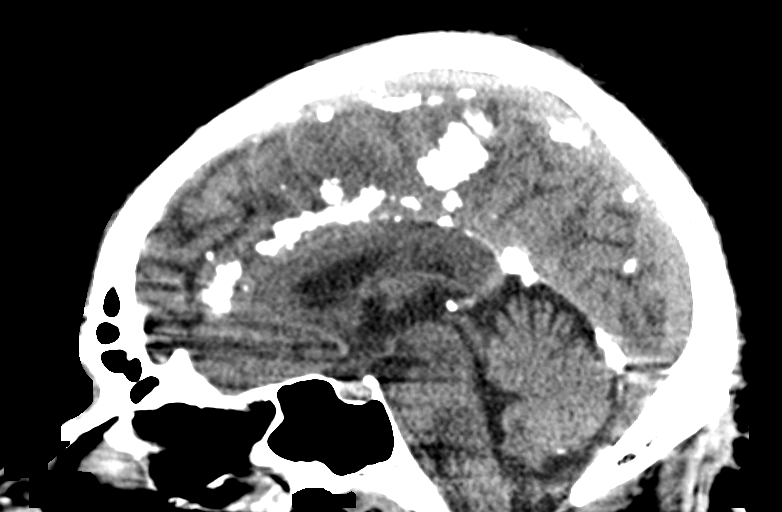
[im 39/58  brain]
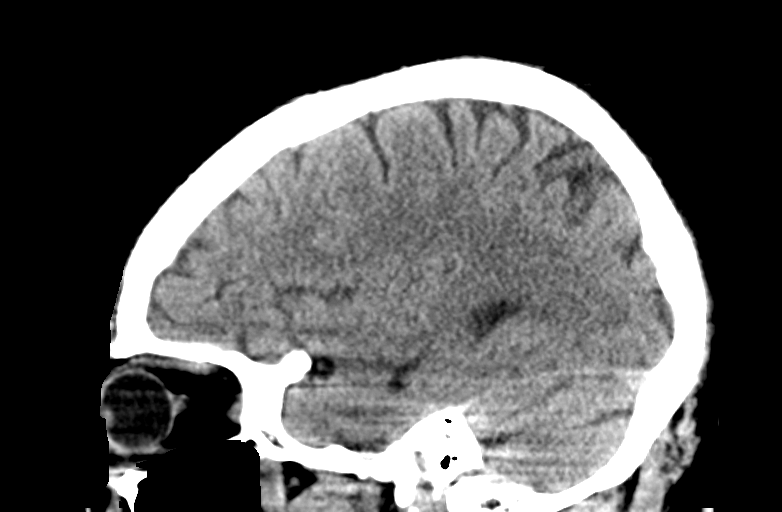

[15 of 47 positions shown; findings below may reference images not displayed]

FINDINGS: Brain: There is no evidence of an acute infarct, intracranial
hemorrhage, mass, midline shift, or extra-axial fluid collection.
The ventricles and sulci are normal. Hypodensities in the cerebral
white matter are unchanged and nonspecific but compatible with mild
chronic small vessel ischemic disease. Prominent dural
calcifications are again noted along the falx and tentorium.

Vascular: Calcified atherosclerosis at the skull base. No hyperdense
vessel.

Skull: No fracture or suspicious osseous lesion.

Sinuses/Orbits: Moderate right ethmoid air cell opacification,
slightly increased from yesterday's CT. Clear mastoid air cells.
Unremarkable orbits.

Other: None.
IMPRESSION: 1. No evidence of acute intracranial abnormality.
2. Mild chronic small vessel ischemic disease.

## 2020-11-29 MED ORDER — ASPIRIN 81 MG PO CHEW
81.0000 mg | CHEWABLE_TABLET | Freq: Every day | ORAL | Status: DC
  Administered 2020-11-30 – 2020-12-03 (×4): 81 mg via ORAL
  Filled 2020-11-29 (×5): qty 1

## 2020-11-29 MED ORDER — LORAZEPAM 2 MG/ML IJ SOLN
4.0000 mg | INTRAMUSCULAR | Status: AC | PRN
  Administered 2020-11-29 – 2020-11-30 (×2): 4 mg via INTRAVENOUS
  Filled 2020-11-29 (×2): qty 2

## 2020-11-29 MED ORDER — THIAMINE HCL 100 MG/ML IJ SOLN
500.0000 mg | INTRAMUSCULAR | Status: AC
  Administered 2020-11-29 – 2020-12-01 (×3): 500 mg via INTRAVENOUS
  Filled 2020-11-29 (×3): qty 5

## 2020-11-29 MED ORDER — DEXTROSE 50 % IV SOLN
INTRAVENOUS | Status: AC
  Filled 2020-11-29: qty 50

## 2020-11-29 MED ORDER — METOPROLOL TARTRATE 5 MG/5ML IV SOLN
5.0000 mg | Freq: Four times a day (QID) | INTRAVENOUS | Status: DC | PRN
  Administered 2020-11-29: 5 mg via INTRAVENOUS
  Filled 2020-11-29: qty 5

## 2020-11-29 MED ORDER — SODIUM CHLORIDE 0.9 % IV SOLN: INTRAVENOUS | Status: DC

## 2020-11-29 MED ORDER — DEXTROSE 10 % IV SOLN: INTRAVENOUS | Status: DC

## 2020-11-29 MED ORDER — AMLODIPINE BESYLATE 10 MG PO TABS
10.0000 mg | ORAL_TABLET | Freq: Every day | ORAL | Status: DC
  Administered 2020-11-30 – 2020-12-03 (×4): 10 mg via ORAL
  Filled 2020-11-29 (×5): qty 1

## 2020-11-29 MED ORDER — HYDRALAZINE HCL 20 MG/ML IJ SOLN
10.0000 mg | Freq: Four times a day (QID) | INTRAMUSCULAR | Status: DC | PRN
  Administered 2020-11-29: 10 mg via INTRAVENOUS
  Filled 2020-11-29: qty 1

## 2020-11-29 MED ORDER — CLONIDINE HCL 0.2 MG/24HR TD PTWK
0.2000 mg | MEDICATED_PATCH | TRANSDERMAL | Status: DC
  Administered 2020-11-29: 0.2 mg via TRANSDERMAL
  Filled 2020-11-29: qty 1

## 2020-11-29 NOTE — Progress Notes (Signed)
Eeg done 

## 2020-11-29 NOTE — ED Notes (Signed)
Gown and full bed linen change done. Patient repositioned back in bed.

## 2020-11-29 NOTE — Procedures (Signed)
History: 70 yo M with AMS  Sedation: None  Technique: This EEG was acquired with electrodes placed according to the International 10-20 electrode system (including Fp1, Fp2, F3, F4, C3, C4, P3, P4, O1, O2, T3, T4, T5, T6, A1, A2, Fz, Cz, Pz). The following electrodes were missing or displaced: none.   Background: The background is relatively low voltage but there is a posterior dominant rhythm of 9 Hz that attenuates with eye opening. In addition, there is generalized irregular delta and theta range intrusion into the background.   Photic stimulation: Physiologic driving is not performed  EEG Abnormalities: 1) Generalized irregular slow activity.   Clinical Interpretation: This EEG is consistent with a mild generalized non-specific cerebral dysfunction(encephalopathy). There was no seizure or seizure predisposition recorded on this study. Please note that lack of epileptiform activity on EEG does not preclude the possibility of epilepsy.   Roland Rack, MD [facility] [phone]  If 7pm- 7am, please page neurology on call as listed in [place].

## 2020-11-29 NOTE — Progress Notes (Signed)
Patient disoriented and fell out of his bed, patient noted to be on the floor face down. Bed alarm noted to be on and floor mats in place.  States he hit his head. Patient assisted back to bed by staff.  Vital signs checked.  MD made aware.

## 2020-11-29 NOTE — ED Notes (Signed)
MD paged regarding CBG. Turning off D10 drip per verbal order.

## 2020-11-29 NOTE — Progress Notes (Addendum)
Inpatient Diabetes Program Recommendations  AACE/ADA: New Consensus Statement on Inpatient Glycemic Control (2015)  Target Ranges:  Prepandial:   less than 140 mg/dL      Peak postprandial:   less than 180 mg/dL (1-2 hours)      Critically ill patients:  140 - 180 mg/dL  Results for AMERICUS, PERKEY (MRN [id number]) as of 11/29/2020 06:16  Ref. Range 11/28/2020 14:21 11/28/2020 18:09 11/28/2020 19:08 11/28/2020 19:59 11/28/2020 21:01  Glucose-Capillary Latest Ref Range: 70 - 99 mg/dL 135 (H) 169 (H) 60 (L) 42 (LL)  50 ml D50% 138 (H)  Results for TRAXTON, KOLENDA (MRN [id number]) as of 11/29/2020 06:16  Ref. Range 11/29/2020 01:16 11/29/2020 01:55 11/29/2020 02:18 11/29/2020 02:31 11/29/2020 02:42 11/29/2020 03:13 11/29/2020 04:25 11/29/2020 05:45  Glucose-Capillary Latest Ref Range: 70 - 99 mg/dL 52 (L)  50 ml D50% 145 (H) 114 (H) 144 (H) 130 (H) 147 (H) 153 (H) 182 (H)     Admit with: Hypoglycemia  History: Type 1 diabetes, Multiple myeloma and cCronic leukopenia, Cocaine abuse  Home DM Meds: Novolog TID per SSI (up to 14 units)       Tresiba 20 units Daily  Current Orders: Novolog 0-6 units TID    Seen by new PCP on 11/08/2020 (Dr. Netty Starring with [facility]) Referral made to Dr. Honor Junes with ENDO at that visit    D10% IVF running 50cc/hr  Novolog very sensitive SSI orders placed   CBGs stable since 2am today   Looks like Creatinine elevated from July 2022--GFR was only 62 back in July This could be contributing to Hypoglycemia issues this admission May need to reduce Insulin for home at time of discharge and strongly recommend pt get re-established with Dr. Honor Junes with ENDO dept      --Will follow patient during hospitalization--  Wyn Quaker RN, MSN, CDE Diabetes Coordinator Inpatient Glycemic Control Team Team Pager: [phone] (8a-5p)

## 2020-11-29 NOTE — Progress Notes (Signed)
SLP Follow up  Note  Patient Details Name: Nicholas Weiss MRN: [id number] DOB: 1950-07-01   In the absence of any acute neurological abnormalities, suspect pt's AMS is related to his multiple medical conditions as well as polysubstance abuse. As these are treated, hopefully pt's mentation will also improve as well as his consumption of POs. Per his nurse, as pt became more alert he was able to eat his lunch without issue. Chest x-ray also negative for acute cardiopulmonary abnormalities. Recommend OT consult for bilateral UE jerking, nursing will need to assist pt with self-feeding.   Almas Rake B. Rutherford Nail M.S., CCC-SLP, [place] [facility] [phone]  Stormy Fabian 11/29/2020, 3:34 PM

## 2020-11-29 NOTE — Progress Notes (Signed)
[facility] Progress Note  Patient: Nicholas Weiss    TFT:[id number]  DOA: 11/28/2020     Date of Service: the patient was seen and examined on 11/29/2020  Chief Complaint  Patient presents with   Hypoglycemia   Involuntary movements   Brief hospital course: Nicholas Weiss is a 70 y.o. male with a past medical history of type 1 insulin-dependent diabetes mellitus, hypertension, hyperlipidemia, multiple myeloma and chronic leukopenia, cocaine abuse.   Patient was recently admitted in June of this year for right-sided myoclonic jerking.  The patient underwent an extensive work-up including MRI of the brain and EEG which was largely unremarkable.  Neurology was consulted and it was concluded that this was related to possibly cocaine metabolites.   Upon EMS arrival for assessment of low blood sugars he was noted to have some intermittent myoclonic jerking movements that started today.  Most of the history provided by ER doctor as when I examined he is sedated after receiving Ativan and Haldol to obtain CT imaging.  Per ED provider, the patient states he sometimes gets these movements now and then.  They are much worse today.  He denies any urinary or bowel incontinence or loss of consciousness.  He denies any tongue biting.  Not on any blood thinners.  His blood sugar in the emergency department was noted to be as low as 42.  He was given an amp of D50 and started on a D5 drip.  The patient told the ED provider that he took his usual dose of insulin and ate a regular breakfast this morning. Per ED provider, he denies any chest pain, abdominal pain, nausea, vomiting, diarrhea.  He states he last used cocaine on Monday.  Denies any other illicit drug use or alcohol use.   Upon my exam he is still having myoclonic jerking primarily of the upper extremities bilaterally.  He required sedation with Ativan and Haldol by the ED provider to obtain CT imaging.   ED Course: Chest was negative.  CT C-spine  showed no acute findings.  His creatinine is elevated at 2.29.  Review of his chart shows a baseline creatinine 1.4-1.8.  Repeat blood sugar in the 130s.  WBC at 2.48 hemoglobin at 10 and hematocrit 29.  Vital signs are stable.   Assessment and Plan:  Metabolic encephalopathy Patient fell out of the bed and hit his head, CT head negative for any acute pathology EEG negative for seizures, shows encephalopathy ABG negative for hypercapnia, ammonia level 13 within normal range Supportive care and fall precautions Continue IV fluid for hydration Started thiamine IV 500 milligrams daily for 3 days   Persistent hypoglycemia: Given 1 amp of D50 in the emergency department now on a D5 drip.  Start Accu-Cheks every 2 hours.  If blood sugars start rising discontinue the D5 drip.   We will hold home insulin regimen including Tresiba and NovoLog sliding scale insulin before meals.   Currently NPO. Start diet when ASM resolve   Type 1 insulin-dependent diabetes mellitus: Holding home [name] and NovoLog sliding scale insulin before meals   Hypertension: Continue home amlodipine, clonidine patch Use IV Lopressor as needed and IV hydralazine as needed    Chronic leukopenia: No acute treatment   Hyperlipidemia: Continue home lovastatin   Neuropathy: Continue home Lyrica   Cocaine use: Counsel on cessation UDS positive for cocaine and THC  Acute kidney injury on chronic kidney disease:  Given lactated Ringer's 1 L bolus in the emergency department.  Continue  NS IVF 75 cc an hour. Repeat BMP in AM.   Episodic myoclonic jerking: UDS pending. Can discuss with neurology in the morning if extensive work-up should be obtained again with a EEG and MRI as he recently had similar symptomatology in June and extensive work-up was negative and was concluded that this was due to cocaine metabolites.  The patient told the ED provider last time he used cocaine was on Monday.  Ativan as needed ordered for any  further episodic myoclonic jerking. EEG: consistent with a mild generalized non-specific cerebral dysfunction(encephalopathy). There was no seizure or seizure predisposition recorded on this study. Please note that lack of epileptiform activity on EEG does not preclude the possibility of epilepsy.    Body mass index is 25.32 kg/m.  Interventions:     Diet: Carb modified diet DVT Prophylaxis: Subcutaneous Heparin    Advance goals of care discussion: Full code  Family Communication: family was NOT present at bedside, at the time of interview.  The pt could not provided permission to discuss medical plan with the family.   Disposition:  Pt is from Home, admitted with hypoglycemia, AMS, still has AMS, which precludes a safe discharge. Discharge to Home, when medically stable, may be in 2 to 3 days.  Subjective: She was admitted overnight due to hypoglycemia, still patient has altered mental status.  AO x1 and patient was very sleepy could not follow commands and could not offer any complaints. Did fell out of the bed as per nursing report and hit his head, no LOC.  CT scan head negative for any intracranial pathology  Physical Exam: General:  obtunded, AAOx 1, sleepy.  Appear in no distress, sleepy Eyes: Briefly opened, and dozing off ENT: Oral Mucosa Clear, moist  Neck: no JVD,  Cardiovascular: S1 and S2 Present, no Murmur,  Respiratory: good respiratory effort, Bilateral Air entry equal and Decreased, no Crackles, no wheezes Abdomen: Bowel Sound present, Soft and no tenderness,  Skin: no rashes Extremities: no Pedal edema, no calf tenderness Neurologic: sleepy, cultivated, moving EXTR spontaneously, unable to follow commands Gait not checked due to patient safety concerns  Vitals:   11/29/20 1017 11/29/20 1038 11/29/20 1109 11/29/20 1331  BP: (!) 167/106  (!) 196/123 (!) 184/111  Pulse: 80     Resp:  16    Temp:  (!) 97.5 F (36.4 C)    TempSrc:  Oral    SpO2: 96%      Weight:      Height:        Intake/Output Summary (Last 24 hours) at 11/29/2020 1718 Last data filed at 11/29/2020 1410 Gross per 24 hour  Intake 240 ml  Output 200 ml  Net 40 ml   Filed Weights   11/28/20 1416  Weight: 81.2 kg    Data Reviewed: I have personally reviewed and interpreted daily labs, tele strips, imagings as discussed above. I reviewed all nursing notes, pharmacy notes, vitals, pertinent old records I have discussed plan of care as described above with RN and patient/family.  CBC: Recent Labs  Lab 11/28/20 1419 11/28/20 1800 11/29/20 0703  WBC 2.4* 3.0* 2.4*  NEUTROABS  --  1.7  --   HGB 10.1* 10.0* 11.2*  HCT 29.8* 30.4* 33.7*  MCV 92.3 93.5 92.1  PLT 138* 130* 169*   Basic Metabolic Panel: Recent Labs  Lab 11/28/20 1419 11/29/20 0703 11/29/20 1018  NA 135 131*  --   K 4.1 4.2  --   CL 104  101  --   CO2 25 25  --   GLUCOSE 130* 370*  --   BUN 33* 24*  --   CREATININE 2.29* 1.34*  --   CALCIUM 8.4* 8.1*  --   MG  --   --  1.8  PHOS  --   --  2.9    Studies: CT HEAD WO CONTRAST (5MM)  Result Date: 11/29/2020 CLINICAL DATA:  Head trauma.  Hypoglycemia and involuntary movement. EXAM: CT HEAD WITHOUT CONTRAST TECHNIQUE: Contiguous axial images were obtained from the base of the skull through the vertex without intravenous contrast. COMPARISON:  Head CT 11/28/2020 and MRI 09/12/2020 FINDINGS: Brain: There is no evidence of an acute infarct, intracranial hemorrhage, mass, midline shift, or extra-axial fluid collection. The ventricles and sulci are normal. Hypodensities in the cerebral white matter are unchanged and nonspecific but compatible with mild chronic small vessel ischemic disease. Prominent dural calcifications are again noted along the falx and tentorium. Vascular: Calcified atherosclerosis at the skull base. No hyperdense vessel. Skull: No fracture or suspicious osseous lesion. Sinuses/Orbits: Moderate right ethmoid air cell opacification,  slightly increased from yesterday's CT. Clear mastoid air cells. Unremarkable orbits. Other: None. IMPRESSION: 1. No evidence of acute intracranial abnormality. 2. Mild chronic small vessel ischemic disease. Electronically Signed   By: Logan Bores M.D.   On: 11/29/2020 12:25   CT HEAD WO CONTRAST (5MM)  Result Date: 11/28/2020 CLINICAL DATA:  Hypoglycemia. EXAM: CT HEAD WITHOUT CONTRAST TECHNIQUE: Contiguous axial images were obtained from the base of the skull through the vertex without intravenous contrast. COMPARISON:  September 12, 2020 FINDINGS: Brain: No evidence of acute infarction, hemorrhage, hydrocephalus, extra-axial collection or mass lesion/mass effect. Stable areas of calcification are seen throughout the falx and tentorium. Vascular: No hyperdense vessel or unexpected calcification. Skull: Normal. Negative for fracture or focal lesion. Sinuses/Orbits: There is moderate severity right ethmoid sinus mucosal thickening. Other: None. IMPRESSION: 1. No acute intracranial pathology. Electronically Signed   By: Virgina Norfolk M.D.   On: 11/28/2020 21:17   CT Cervical Spine Wo Contrast  Result Date: 11/28/2020 CLINICAL DATA:  Hypoglycemia. EXAM: CT CERVICAL SPINE WITHOUT CONTRAST TECHNIQUE: Multidetector CT imaging of the cervical spine was performed without intravenous contrast. Multiplanar CT image reconstructions were also generated. COMPARISON:  None. FINDINGS: Alignment: Normal. Skull base and vertebrae: No acute fracture. No primary bone lesion or focal pathologic process. Soft tissues and spinal canal: No prevertebral fluid or swelling. No visible canal hematoma. Disc levels: Mild endplate sclerosis and mild to moderate severity anterior osteophyte formation is seen at the levels of C5-C6 and C6-C7. Mild intervertebral disc space narrowing is seen at the levels of C5-C6 and C6-C7. Mild, bilateral multilevel facet joint hypertrophy is noted. Upper chest: Negative. Other: None. IMPRESSION: 1. No  acute fracture or subluxation of the cervical spine. 2. Mild to moderate severity degenerative changes at the levels of C5-C6 and C6-C7. Electronically Signed   By: Virgina Norfolk M.D.   On: 11/28/2020 21:25   EEG adult  Result Date: 11/29/2020 Greta Doom, MD     11/29/2020  4:04 PM History: 70 yo M with AMS Sedation: None Technique: This EEG was acquired with electrodes placed according to the International 10-20 electrode system (including Fp1, Fp2, F3, F4, C3, C4, P3, P4, O1, O2, T3, T4, T5, T6, A1, A2, Fz, Cz, Pz). The following electrodes were missing or displaced: none. Background: The background is relatively low voltage but there is a posterior dominant rhythm of  9 Hz that attenuates with eye opening. In addition, there is generalized irregular delta and theta range intrusion into the background. Photic stimulation: Physiologic driving is not performed EEG Abnormalities: 1) Generalized irregular slow activity. Clinical Interpretation: This EEG is consistent with a mild generalized non-specific cerebral dysfunction(encephalopathy). There was no seizure or seizure predisposition recorded on this study. Please note that lack of epileptiform activity on EEG does not preclude the possibility of epilepsy. Roland Rack, MD [facility] [phone] If 7pm- 7am, please page neurology on call as listed in [place].    Scheduled Meds:  amLODipine  10 mg Oral Daily   aspirin  81 mg Oral Daily   cloNIDine  0.2 mg Transdermal Weekly   heparin  5,000 Units Subcutaneous Q8H   insulin aspart  0-6 Units Subcutaneous TID WC   Continuous Infusions:  sodium chloride 75 mL/hr at 11/29/20 1341   dextrose Stopped (11/29/20 0838)   thiamine injection 500 mg (11/29/20 1343)   PRN Meds: acetaminophen **OR** acetaminophen, hydrALAZINE, HYDROcodone-acetaminophen, LORazepam, metoprolol tartrate, morphine injection, ondansetron **OR** ondansetron (ZOFRAN) IV, polyethylene glycol  Time spent:  35 minutes  Author: Val Riles. MD [facility] 11/29/2020 5:18 PM  To reach On-call, see care teams to locate the attending and reach out to them via [email or web address]. If 7PM-7AM, please contact night-coverage If you still have difficulty reaching the attending provider, please page the [facility] (Director on Call) for [facility] on amion for assistance.

## 2020-11-29 NOTE — Plan of Care (Signed)
Patient's family requested transfer to [facility] Transfer initiated, called to [facility] for transfer but they are at the capacity right now,and they are not accepting any transfers at this time.  We will try to call them again tomorrow.

## 2020-11-29 NOTE — Progress Notes (Signed)
Patient remains disoriented throughout shift.  Appears to be more alert and asking questions.  Patient still attempting to get out of bed without assistance, previous fall noted in shift.  Fall precautions/seizure and aspiration precautions continued.  Unable to do admission questions due to patient's disorientation at this time.

## 2020-11-30 DIAGNOSIS — F1410 Cocaine abuse, uncomplicated: Secondary | ICD-10-CM | POA: Diagnosis present

## 2020-11-30 DIAGNOSIS — E162 Hypoglycemia, unspecified: Secondary | ICD-10-CM | POA: Diagnosis not present

## 2020-11-30 LAB — GLUCOSE, CAPILLARY
Glucose-Capillary: 269 mg/dL — ABNORMAL HIGH (ref 70–99)
Glucose-Capillary: 326 mg/dL — ABNORMAL HIGH (ref 70–99)
Glucose-Capillary: 283 mg/dL — ABNORMAL HIGH (ref 70–99)
Glucose-Capillary: 302 mg/dL — ABNORMAL HIGH (ref 70–99)
Glucose-Capillary: 254 mg/dL — ABNORMAL HIGH (ref 70–99)
Glucose-Capillary: 233 mg/dL — ABNORMAL HIGH (ref 70–99)
Glucose-Capillary: 227 mg/dL — ABNORMAL HIGH (ref 70–99)
Glucose-Capillary: 196 mg/dL — ABNORMAL HIGH (ref 70–99)
Glucose-Capillary: 186 mg/dL — ABNORMAL HIGH (ref 70–99)
Glucose-Capillary: 208 mg/dL — ABNORMAL HIGH (ref 70–99)
Glucose-Capillary: 290 mg/dL — ABNORMAL HIGH (ref 70–99)

## 2020-11-30 LAB — CBC
HCT: 37.6 % — ABNORMAL LOW (ref 39.0–52.0)
Hemoglobin: 12.6 g/dL — ABNORMAL LOW (ref 13.0–17.0)
MCH: 30.3 pg (ref 26.0–34.0)
MCHC: 33.5 g/dL (ref 30.0–36.0)
MCV: 90.4 fL (ref 80.0–100.0)
Platelets: 133 10*3/uL — ABNORMAL LOW (ref 150–400)
RBC: 4.16 MIL/uL — ABNORMAL LOW (ref 4.22–5.81)
RDW: 14 % (ref 11.5–15.5)
WBC: 2.5 10*3/uL — ABNORMAL LOW (ref 4.0–10.5)
nRBC: 0 % (ref 0.0–0.2)

## 2020-11-30 LAB — BASIC METABOLIC PANEL
Anion gap: 9 (ref 5–15)
BUN: 26 mg/dL — ABNORMAL HIGH (ref 8–23)
CO2: 23 mmol/L (ref 22–32)
Calcium: 8.2 mg/dL — ABNORMAL LOW (ref 8.9–10.3)
Chloride: 105 mmol/L (ref 98–111)
Creatinine, Ser: 1.51 mg/dL — ABNORMAL HIGH (ref 0.61–1.24)
GFR, Estimated: 50 mL/min — ABNORMAL LOW (ref >60)
Glucose, Bld: 279 mg/dL — ABNORMAL HIGH (ref 70–99)
Potassium: 5 mmol/L (ref 3.5–5.1)
Sodium: 137 mmol/L (ref 135–145)

## 2020-11-30 LAB — MAGNESIUM: Magnesium: 1.9 mg/dL (ref 1.7–2.4)

## 2020-11-30 LAB — PHOSPHORUS: Phosphorus: 2.7 mg/dL (ref 2.5–4.6)

## 2020-11-30 MED ORDER — HALOPERIDOL 2 MG PO TABS
2.0000 mg | ORAL_TABLET | Freq: Two times a day (BID) | ORAL | Status: DC
  Administered 2020-11-30 – 2020-12-03 (×7): 2 mg via ORAL
  Filled 2020-11-30 (×8): qty 1

## 2020-11-30 MED ORDER — HALOPERIDOL LACTATE 5 MG/ML IJ SOLN: 2.0000 mg | Freq: Four times a day (QID) | INTRAMUSCULAR | Status: DC | PRN

## 2020-11-30 MED ORDER — HALOPERIDOL LACTATE 5 MG/ML IJ SOLN
INTRAMUSCULAR | Status: AC
  Filled 2020-11-30: qty 1

## 2020-11-30 MED ORDER — GABAPENTIN 100 MG PO CAPS
200.0000 mg | ORAL_CAPSULE | Freq: Three times a day (TID) | ORAL | Status: DC
  Administered 2020-11-30 – 2020-12-03 (×11): 200 mg via ORAL
  Filled 2020-11-30 (×11): qty 2

## 2020-11-30 MED ORDER — HALOPERIDOL LACTATE 5 MG/ML IJ SOLN
5.0000 mg | Freq: Once | INTRAMUSCULAR | Status: AC
  Administered 2020-11-30: 5 mg via INTRAMUSCULAR

## 2020-11-30 MED ORDER — INSULIN ASPART 100 UNIT/ML IJ SOLN
3.0000 [IU] | Freq: Once | INTRAMUSCULAR | Status: AC
  Administered 2020-11-30: 3 [IU] via SUBCUTANEOUS
  Filled 2020-11-30: qty 1

## 2020-11-30 NOTE — Consult Note (Signed)
Client seen and not able to arouse, IV Haldol given earlier for agitation.  This provider spoke with his RN who reports he will take crushed meds.  Recommend Haldol 2 mg oral BID for substance abuse and agitation along with gabapentin 200 mg TID for stimulant withdrawal symptoms.  Psych will continue to follow.  His sister-in-law was in the room and reports he is not aggressive at his baseline.  Waylan Boga, PMHNP

## 2020-11-30 NOTE — Plan of Care (Signed)
Since no current beds at [facility]/friend requesting to place orders for transfer to [facility].  Dr. Informed.

## 2020-11-30 NOTE — Progress Notes (Addendum)
[facility] Progress Note  Patient: Nicholas Weiss    PVV:[id number]  DOA: 11/28/2020     Date of Service: the patient was seen and examined on 11/30/2020  Chief Complaint  Patient presents with   Hypoglycemia   Involuntary movements   Brief hospital course: Nicholas Weiss is a 70 y.o. male with a past medical history of type 1 insulin-dependent diabetes mellitus, hypertension, hyperlipidemia, multiple myeloma and chronic leukopenia, cocaine abuse.   Patient was recently admitted in June of this year for right-sided myoclonic jerking.  The patient underwent an extensive work-up including MRI of the brain and EEG which was largely unremarkable.  Neurology was consulted and it was concluded that this was related to possibly cocaine metabolites.   Upon EMS arrival for assessment of low blood sugars he was noted to have some intermittent myoclonic jerking movements that started today.  Most of the history provided by ER doctor as when I examined he is sedated after receiving Ativan and Haldol to obtain CT imaging.  Per ED provider, the patient states he sometimes gets these movements now and then.  They are much worse today.  He denies any urinary or bowel incontinence or loss of consciousness.  He denies any tongue biting.  Not on any blood thinners.  His blood sugar in the emergency department was noted to be as low as 42.  He was given an amp of D50 and started on a D5 drip.  The patient told the ED provider that he took his usual dose of insulin and ate a regular breakfast this morning. Per ED provider, he denies any chest pain, abdominal pain, nausea, vomiting, diarrhea.  He states he last used cocaine on Monday.  Denies any other illicit drug use or alcohol use.   Upon my exam he is still having myoclonic jerking primarily of the upper extremities bilaterally.  He required sedation with Ativan and Haldol by the ED provider to obtain CT imaging.   ED Course: Chest was negative.  CT C-spine  showed no acute findings.  His creatinine is elevated at 2.29.  Review of his chart shows a baseline creatinine 1.4-1.8.  Repeat blood sugar in the 130s.  WBC at 2.48 hemoglobin at 10 and hematocrit 29.  Vital signs are stable.   Assessment and Plan:  Agitation and delirium, noticed on 9/3 Patient was given Haldol 5 mg IM x1 dose Did, recommended Haldol 2 mg IV every 6 hourly as needed and gabapentin 20 mg p.o. 3 times daily for drug abuse withdrawal symptoms We will continue to follow psych for further recommendation  Metabolic encephalopathy Patient fell out of the bed and hit his head, CT head negative for any acute pathology EEG negative for seizures, shows encephalopathy ABG negative for hypercapnia, ammonia level 13 within normal range Supportive care and fall precautions Continue IV fluid for hydration Started thiamine IV 500 milligrams daily for 3 days   Persistent hypoglycemia: Given 1 amp of D50 in the emergency department now on a D5 drip.  Start Accu-Cheks every 2 hours.  If blood sugars start rising discontinue the D5 drip.   We will hold home insulin regimen including Tresiba and NovoLog sliding scale insulin before meals.   Hypoglycemia resolved, Resume diet when patient is awake and alert Continue aspiration precautions    Type 1 insulin-dependent diabetes mellitus: Holding home [name] and NovoLog sliding scale insulin before meals   Hypertension: Continue home amlodipine, clonidine patch Use IV Lopressor as needed and IV  hydralazine as needed    Chronic leukopenia: No acute treatment   Hyperlipidemia: Continue home lovastatin   Neuropathy: Continue home Lyrica   Cocaine use: Counsel on cessation UDS positive for cocaine and THC  Acute kidney injury on chronic kidney disease:  Given lactated Ringer's 1 L bolus in the emergency department.   Continue  NS IVF 75 cc an hour. Repeat BMP in AM. Cr 2.29--1.34--1.51    Episodic myoclonic jerking: UDS pending.  Can discuss with neurology in the morning if extensive work-up should be obtained again with a EEG and MRI as he recently had similar symptomatology in June and extensive work-up was negative and was concluded that this was due to cocaine metabolites.  The patient told the ED provider last time he used cocaine was on Monday.  Ativan as needed ordered for any further episodic myoclonic jerking. EEG: consistent with a mild generalized non-specific cerebral dysfunction(encephalopathy). There was no seizure or seizure predisposition recorded on this study. Please note that lack of epileptiform activity on EEG does not preclude the possibility of epilepsy.    Body mass index is 25.32 kg/m.  Interventions:   9/2 family requested to transfer patient to [facility] for higher level of care, patient's girlfriend was not satisfied as patient fell in the room when he was just transferred from the ED.  [facility] does not have beds at this time, suggested to call back in the morning.  9/3 called again today at [facility] for transfer, case is open.  Awaiting for callback 9/3 w/d [facility] hospitalist Dr. Pincus Sanes who did not accept the patient because patient does not need higher level of care/tertiary center and currently they are at the capacity and not accepting transfers at family request.     Diet: Carb modified diet DVT Prophylaxis: Subcutaneous Heparin    Advance goals of care discussion: Full code  Family Communication: family was NOT present at bedside, at the time of interview.  The pt could not provided permission to discuss medical plan with the family.   Disposition:  Pt is from Home, admitted with hypoglycemia, AMS, still has AMS, which precludes a safe discharge. Discharge to Home, when medically stable, may be in 2 to 3 days.  Subjective: She was admitted overnight due to hypoglycemia, still patient has altered mental status.  AO x1 and patient was very sleepy could not follow commands and could not offer any  complaints. Did fell out of the bed as per nursing report and hit his head, no LOC.  CT scan head negative for any intracranial pathology  Physical Exam: General: Awake and alert, AAO x2, patient was agitated and was given Haldol so he was dozing off during my interview. Appear in no distress, sleepy Eyes: Briefly opened, and dozing off ENT: Oral Mucosa Clear, moist  Neck: no JVD,  Cardiovascular: S1 and S2 Present, no Murmur,  Respiratory: good respiratory effort, Bilateral Air entry equal and Decreased, no Crackles, no wheezes Abdomen: Bowel Sound present, Soft and no tenderness,  Skin: no rashes Extremities: no Pedal edema, no calf tenderness Neurologic: sleepy, cultivated, moving EXTR spontaneously, unable to follow commands Gait not checked due to patient safety concerns  Vitals:   11/29/20 2119 11/30/20 0407 11/30/20 0413 11/30/20 0803  BP: (!) 149/92  (!) 165/111 129/88  Pulse: 85  98 99  Resp: _0 Temp: 98.1 F (36.7 C)  97.8 F (36.6 C)   TempSrc: Axillary  Axillary   SpO2: 100%  100% 100%  Weight:  74.5 kg    Height:        Intake/Output Summary (Last 24 hours) at 11/30/2020 1305 Last data filed at 11/30/2020 0930 Gross per 24 hour  Intake 1747.81 ml  Output 1075 ml  Net 672.81 ml   Filed Weights   11/28/20 1416 11/30/20 0407  Weight: 81.2 kg 74.5 kg    Data Reviewed: I have personally reviewed and interpreted daily labs, tele strips, imagings as discussed above. I reviewed all nursing notes, pharmacy notes, vitals, pertinent old records I have discussed plan of care as described above with RN and patient/family.  CBC: Recent Labs  Lab 11/28/20 1419 11/28/20 1800 12-26-2020 0703 11/30/20 0444  WBC 2.4* 3.0* 2.4* 2.5*  NEUTROABS  --  1.7  --   --   HGB 10.1* 10.0* 11.2* 12.6*  HCT 29.8* 30.4* 33.7* 37.6*  MCV 92.3 93.5 92.1 90.4  PLT 138* 130* 136* 675*   Basic Metabolic Panel: Recent Labs  Lab 11/28/20 1419 12-26-20 0703 2020-12-26 1018  11/30/20 0444  NA 135 131*  --  137  K 4.1 4.2  --  5.0  CL 104 101  --  105  CO2 25 25  --  23  GLUCOSE 130* 370*  --  279*  BUN 33* 24*  --  26*  CREATININE 2.29* 1.34*  --  1.51*  CALCIUM 8.4* 8.1*  --  8.2*  MG  --   --  1.8 1.9  PHOS  --   --  2.9 2.7    Studies: EEG adult  Result Date: 12-26-2020 Greta Doom, MD     12-26-2020  4:04 PM History: 70 yo M with AMS Sedation: None Technique: This EEG was acquired with electrodes placed according to the International 10-20 electrode system (including Fp1, Fp2, F3, F4, C3, C4, P3, P4, O1, O2, T3, T4, T5, T6, A1, A2, Fz, Cz, Pz). The following electrodes were missing or displaced: none. Background: The background is relatively low voltage but there is a posterior dominant rhythm of 9 Hz that attenuates with eye opening. In addition, there is generalized irregular delta and theta range intrusion into the background. Photic stimulation: Physiologic driving is not performed EEG Abnormalities: 1) Generalized irregular slow activity. Clinical Interpretation: This EEG is consistent with a mild generalized non-specific cerebral dysfunction(encephalopathy). There was no seizure or seizure predisposition recorded on this study. Please note that lack of epileptiform activity on EEG does not preclude the possibility of epilepsy. Roland Rack, MD [facility] [phone] If 7pm- 7am, please page neurology on call as listed in [place].    Scheduled Meds:  amLODipine  10 mg Oral Daily   aspirin  81 mg Oral Daily   cloNIDine  0.2 mg Transdermal Weekly   gabapentin  200 mg Oral TID   haloperidol  2 mg Oral BID   haloperidol lactate       heparin  5,000 Units Subcutaneous Q8H   insulin aspart  0-6 Units Subcutaneous TID WC   Continuous Infusions:  sodium chloride 75 mL/hr at 11/30/20 0406   dextrose Stopped (26-Dec-2020 0838)   thiamine injection 500 mg (2020-12-26 1343)   PRN Meds: acetaminophen **OR** acetaminophen, haloperidol  lactate, hydrALAZINE, HYDROcodone-acetaminophen, metoprolol tartrate, morphine injection, ondansetron **OR** ondansetron (ZOFRAN) IV, polyethylene glycol  Time spent: 35 minutes  Author: Val Riles. MD [facility] 11/30/2020 1:05 PM  To reach On-call, see care teams to locate the attending and reach out to them via [email or web address]. If 7PM-7AM, please contact night-coverage  If you still have difficulty reaching the attending provider, please page the [facility] (Director on Call) for [facility] on amion for assistance.

## 2020-11-30 NOTE — TOC Initial Note (Signed)
Transition of Care [facility]) - Initial/Assessment Note    Patient Details  Name: Nicholas Weiss MRN: [id number] Date of Birth: 1951/03/26  Transition [facility]) CM/SW Contact:    Magnus Ivan, LCSW Phone Number: 11/30/2020, 9:45 AM  Clinical Narrative:         Patient is disoriented. Spoke to patient's friend Nicholas Weiss) for high risk screening. Patient lives with Nicholas Weiss. At baseline, patient drives himself to appointments. No HH or SNF history. Patient has a tub/shower bench at home. PCP is Dr. Netty Starring at [facility]. Pharmacy is Paediatric nurse on [facility]. Nicholas Weiss denies TOC needs at this time.          Expected Discharge Plan: Home/Self Care Barriers to Discharge: Continued Medical Work up   Patient Goals and CMS Choice Patient states their goals for this hospitalization and ongoing recovery are:: to return home with [facility].gov Compare Post Acute Care list provided to:: Patient Represenative (must comment)    Expected Discharge Plan and Services Expected Discharge Plan: Home/Self Care       Living arrangements for the past 2 months: Single Family Home                                      Prior Living Arrangements/Services Living arrangements for the past 2 months: Single Family Home Lives with:: Friends Patient language and need for interpreter reviewed:: Yes Do you feel safe going back to the place where you live?: Yes      Need for Family Participation in Patient Care: Yes (Comment) Care giver support system in place?: Yes (comment)   Criminal Activity/Legal Involvement Pertinent to Current Situation/Hospitalization: No - Comment as needed  Activities of Daily Living      Permission Sought/Granted Permission sought to share information with : Facility Art therapist granted to share information with : Yes, Verbal Permission Granted (by friend Nicholas Weiss)     Permission granted to share info w AGENCY: if needed         Emotional Assessment       Orientation: : Fluctuating Orientation (Suspected and/or reported Sundowners) Alcohol / Substance Use: Not Applicable Psych Involvement: No (comment)  Admission diagnosis:  Cocaine abuse ([place]) [F14.10] Hypoglycemia [E16.2] Abnormal movements [R25.9] Patient Active Problem List   Diagnosis Date Noted   Muscle twitching 09/12/2020   Smoldering multiple myeloma ([place]) 08/02/2020   Pre-syncope 08/01/2020   Heart block AV first degree 08/01/2020   Anemia of chronic kidney failure, stage 3 (moderate) ([place]) 05/28/2020   PUD (peptic ulcer disease)    Esophageal dysphagia    Encounter for screening colonoscopy    Hyperglycemia due to type 2 diabetes mellitus ([place]) 07/28/2019   Generalized weakness 07/28/2019   Type 2 diabetes mellitus with hyperglycemia ([place]) 07/28/2019   Hypoglycemia 04/27/2019   Unresponsiveness 04/27/2019   Lung nodule 04/07/2019   Uncontrolled type 1 diabetes mellitus ([place])    Acute renal failure with acute tubular necrosis superimposed on stage 3a chronic kidney disease ([place])    Hypertensive urgency 04/05/2019   AKI (acute kidney injury) ([place]) 04/05/2019   CKD (chronic kidney disease) stage 3, GFR 30-59 ml/min ([place]) 04/05/2019   DKA (diabetic ketoacidoses) 04/06/2016   Hypercholesterolemia 01/24/2016   Essential (primary) hypertension 06/07/2006   Type 1 diabetes mellitus with diabetic neuropathy, unspecified ([place]) 05/31/2006   PCP:  Birdie Sons, MD Pharmacy:   [facility] [phone] -  [address] (N), [place] ([place]) [place] [address] Phone: [phone] Fax: [facility] - [place], [place] [address] Phone: [phone] Fax: [phone]     Social Determinants of Health (SDOH) Interventions    Readmission Risk Interventions Readmission Risk  Prevention Plan 11/30/2020  Transportation Screening Complete  Medication Review ([place]) Complete  PCP or Specialist appointment within 3-5 days of discharge Complete  HRI or Home Care Consult Complete  SW Recovery Care/Counseling Consult Complete  [place] Complete  Some recent data might be hidden

## 2020-11-30 NOTE — Progress Notes (Signed)
PT Cancellation Note  Patient Details Name: Nicholas Weiss MRN: [id number] DOB: 1950/11/17   Cancelled Treatment:    Reason Eval/Treat Not Completed: Medical issues which prohibited therapy;Patient's level of consciousness (Per discusion with RN, staff having difficulty calming and orienting patient this date. Additional measures in process. WIll attempt to evaluate again at later date/time as appropriate.)  12:04 PM, 11/30/20 Etta Grandchild, PT, DPT Physical Therapist - [facility]  [phone] ([place])    [place] 11/30/2020, 12:04 PM

## 2020-11-30 NOTE — Progress Notes (Signed)
OT Cancellation Note  Patient Details Name: Nicholas Weiss MRN: [id number] DOB: [birth year]   Cancelled Treatment:    Reason Eval/Treat Not Completed: Medical issues which prohibited therapy (Nursing reports pt. has been aggressive/agitated/combative with staff this am. Will continue to monitor, and intevene at a later time, or date.)  Harrel Carina, MS, OTR/L 11/30/2020, 10:05 AM

## 2020-11-30 NOTE — Progress Notes (Signed)
Inpatient Diabetes Program Recommendations  AACE/ADA: New Consensus Statement on Inpatient Glycemic Control (2015)  Target Ranges:  Prepandial:   less than 140 mg/dL      Peak postprandial:   less than 180 mg/dL (1-2 hours)      Critically ill patients:  140 - 180 mg/dL   Lab Results  Component Value Date   GLUCAP 283 (H) 11/30/2020   HGBA1C 10.8 (H) 09/11/2020    Review of Glycemic Control  Diabetes history: DM type 1 Outpatient Diabetes medications: tresiba 20 units, novolog SSI upt to 14 units tid Current orders for Inpatient glycemic control:  Novolog 0-6 units tid  Inpatient Diabetes Program Recommendations:    Note glucose trends have increased: -  Consider d/cing D10 IV fluids -  If pt not on D 10 with elevations may consider starting a portion of basal insulin.  See note from DM coordinator on 9/2.  Thanks,  Tama Headings RN, MSN, BC-ADM Inpatient Diabetes Coordinator Team Pager [phone] (8a-5p)

## 2020-12-01 ENCOUNTER — Inpatient Hospital Stay: Payer: Medicare (Managed Care)

## 2020-12-01 DIAGNOSIS — F1410 Cocaine abuse, uncomplicated: Secondary | ICD-10-CM | POA: Diagnosis not present

## 2020-12-01 DIAGNOSIS — E162 Hypoglycemia, unspecified: Secondary | ICD-10-CM | POA: Diagnosis not present

## 2020-12-01 LAB — CBC WITH DIFFERENTIAL/PLATELET
Abs Immature Granulocytes: 0.01 10*3/uL (ref 0.00–0.07)
Basophils Absolute: 0 10*3/uL (ref 0.0–0.1)
Basophils Relative: 0 %
Eosinophils Absolute: 0 10*3/uL (ref 0.0–0.5)
Eosinophils Relative: 1 %
HCT: 32.5 % — ABNORMAL LOW (ref 39.0–52.0)
Hemoglobin: 11.1 g/dL — ABNORMAL LOW (ref 13.0–17.0)
Immature Granulocytes: 1 %
Lymphocytes Relative: 38 %
Lymphs Abs: 0.7 10*3/uL (ref 0.7–4.0)
MCH: 31.2 pg (ref 26.0–34.0)
MCHC: 34.2 g/dL (ref 30.0–36.0)
MCV: 91.3 fL (ref 80.0–100.0)
Monocytes Absolute: 0.2 10*3/uL (ref 0.1–1.0)
Monocytes Relative: 12 %
Neutro Abs: 0.9 10*3/uL — ABNORMAL LOW (ref 1.7–7.7)
Neutrophils Relative %: 48 %
Platelets: 135 10*3/uL — ABNORMAL LOW (ref 150–400)
RBC: 3.56 MIL/uL — ABNORMAL LOW (ref 4.22–5.81)
RDW: 14.3 % (ref 11.5–15.5)
Smear Review: NORMAL
WBC: 1.8 10*3/uL — ABNORMAL LOW (ref 4.0–10.5)
nRBC: 0 % (ref 0.0–0.2)

## 2020-12-01 LAB — CBC
HCT: 32.7 % — ABNORMAL LOW (ref 39.0–52.0)
Hemoglobin: 10.9 g/dL — ABNORMAL LOW (ref 13.0–17.0)
MCH: 29.9 pg (ref 26.0–34.0)
MCHC: 33.3 g/dL (ref 30.0–36.0)
MCV: 89.8 fL (ref 80.0–100.0)
Platelets: 129 10*3/uL — ABNORMAL LOW (ref 150–400)
RBC: 3.64 MIL/uL — ABNORMAL LOW (ref 4.22–5.81)
RDW: 13.9 % (ref 11.5–15.5)
WBC: 1.8 10*3/uL — ABNORMAL LOW (ref 4.0–10.5)
nRBC: 0 % (ref 0.0–0.2)

## 2020-12-01 LAB — BASIC METABOLIC PANEL
Anion gap: 5 (ref 5–15)
BUN: 34 mg/dL — ABNORMAL HIGH (ref 8–23)
CO2: 21 mmol/L — ABNORMAL LOW (ref 22–32)
Calcium: 8 mg/dL — ABNORMAL LOW (ref 8.9–10.3)
Chloride: 111 mmol/L (ref 98–111)
Creatinine, Ser: 1.72 mg/dL — ABNORMAL HIGH (ref 0.61–1.24)
GFR, Estimated: 43 mL/min — ABNORMAL LOW (ref >60)
Glucose, Bld: 204 mg/dL — ABNORMAL HIGH (ref 70–99)
Potassium: 4.2 mmol/L (ref 3.5–5.1)
Sodium: 137 mmol/L (ref 135–145)

## 2020-12-01 LAB — GLUCOSE, CAPILLARY
Glucose-Capillary: 264 mg/dL — ABNORMAL HIGH (ref 70–99)
Glucose-Capillary: 320 mg/dL — ABNORMAL HIGH (ref 70–99)
Glucose-Capillary: 268 mg/dL — ABNORMAL HIGH (ref 70–99)
Glucose-Capillary: 284 mg/dL — ABNORMAL HIGH (ref 70–99)

## 2020-12-01 LAB — PHOSPHORUS: Phosphorus: 2.8 mg/dL (ref 2.5–4.6)

## 2020-12-01 LAB — MAGNESIUM: Magnesium: 1.9 mg/dL (ref 1.7–2.4)

## 2020-12-01 IMAGING — US US RENAL
1 series · 14 of 21 positions shown · non-contrast
Comparison: Renal ultrasound [DATE]

CLINICAL DATA: Acute kidney injury.

EXAM:
RENAL / URINARY TRACT ULTRASOUND COMPLETE

[Series 1: us renal · 14 of 21 slices shown]
[im 1/21]
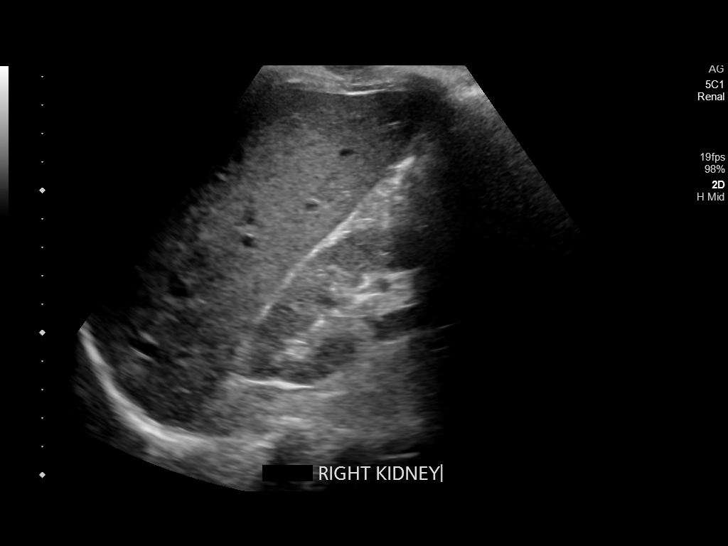
[im 3/21]
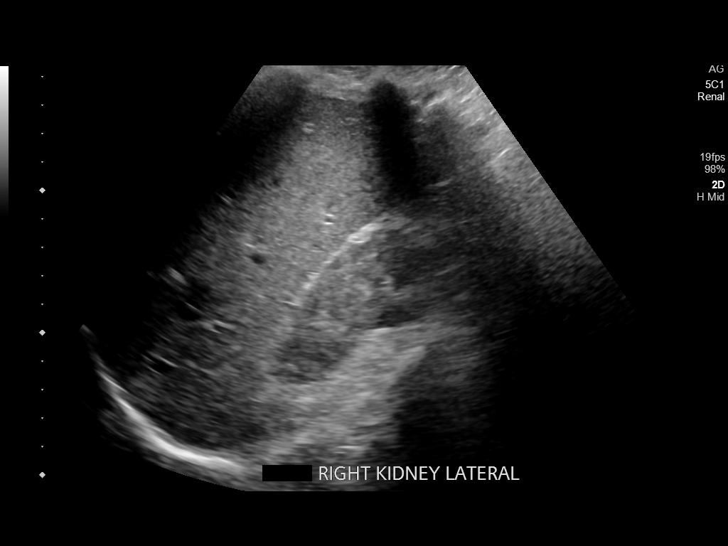
[im 4/21]
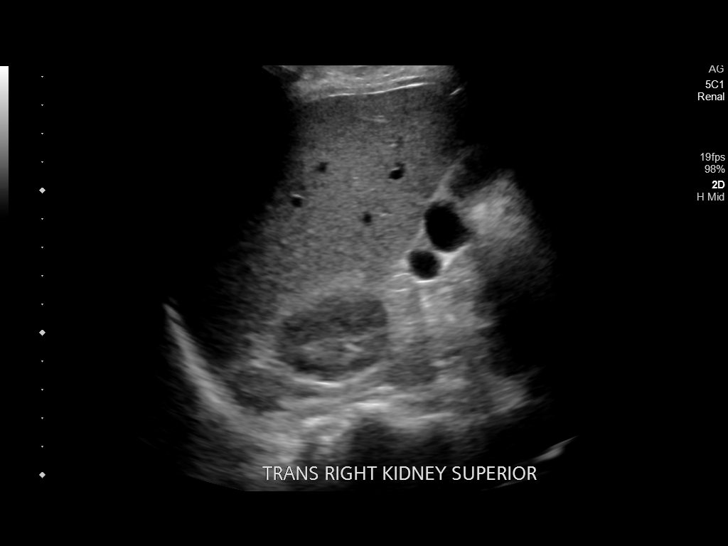
[im 6/21]
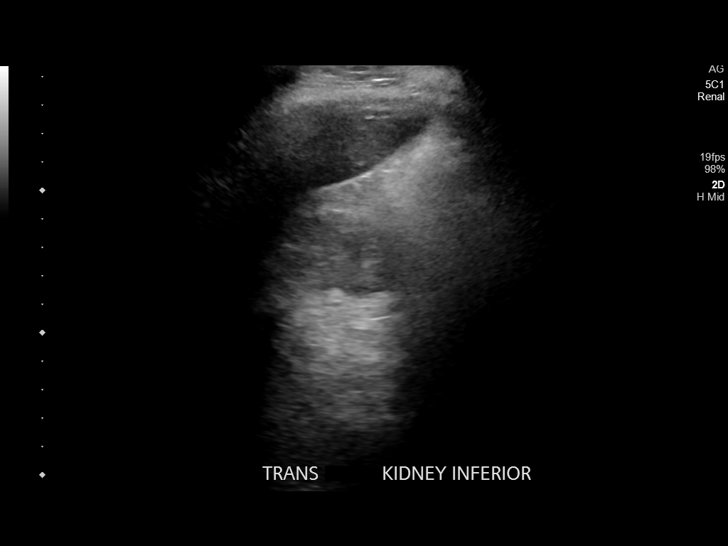
[im 7/21]
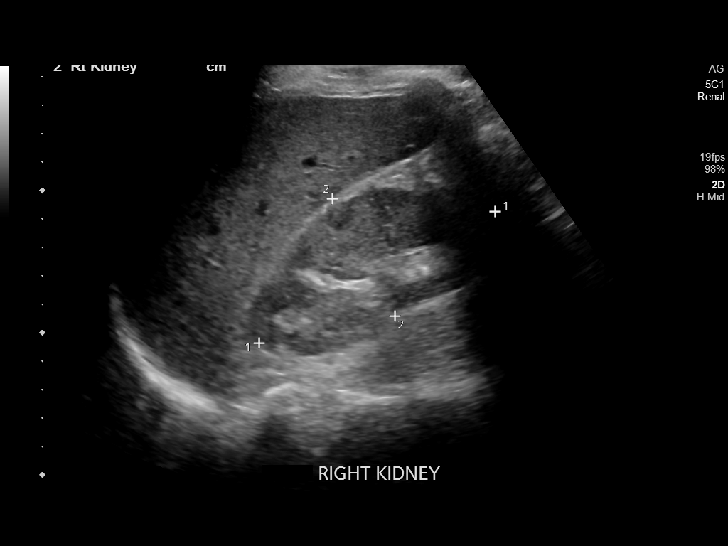
[im 9/21]
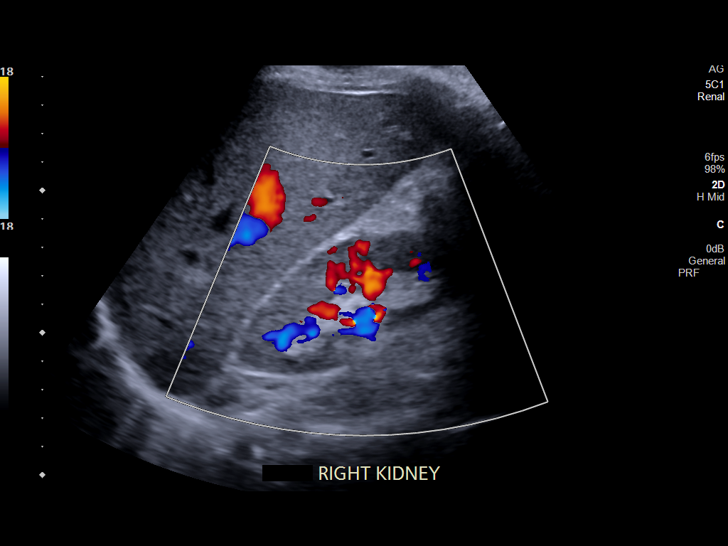
[im 10/21]
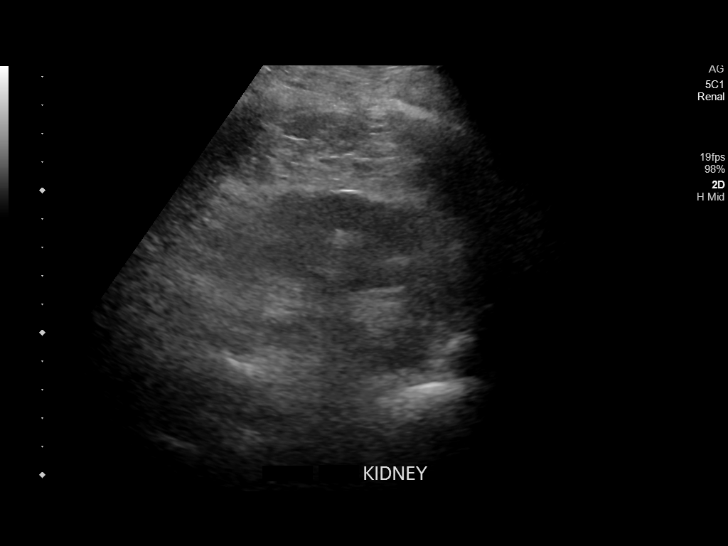
[im 12/21]
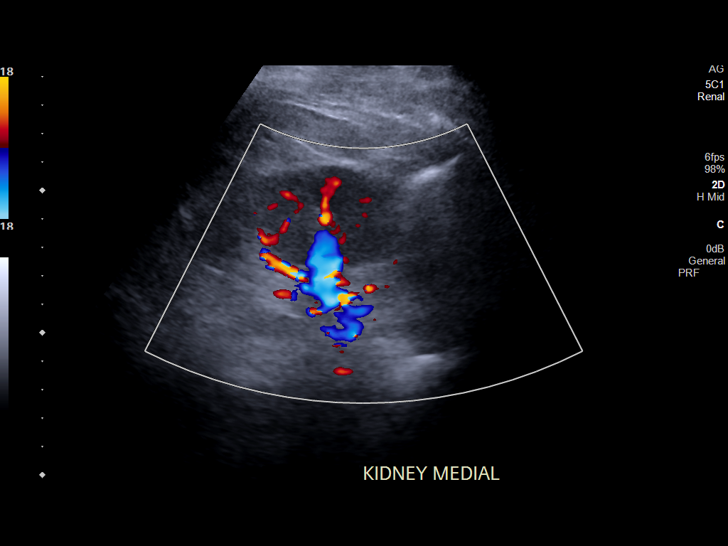
[im 13/21]
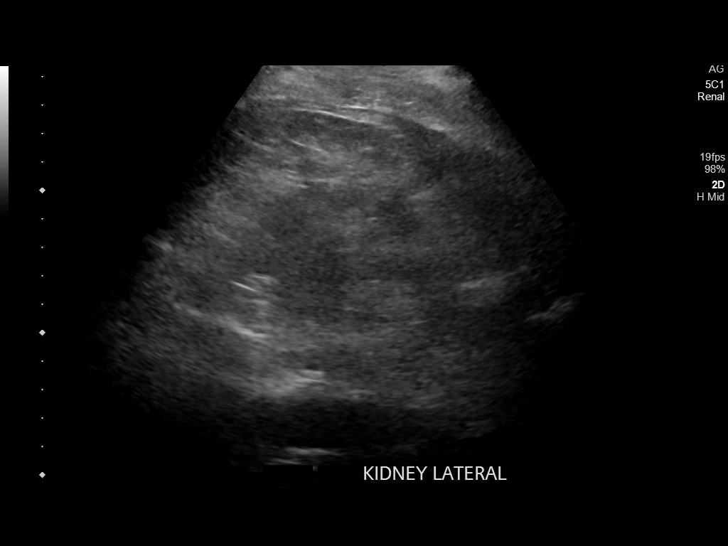
[im 15/21]
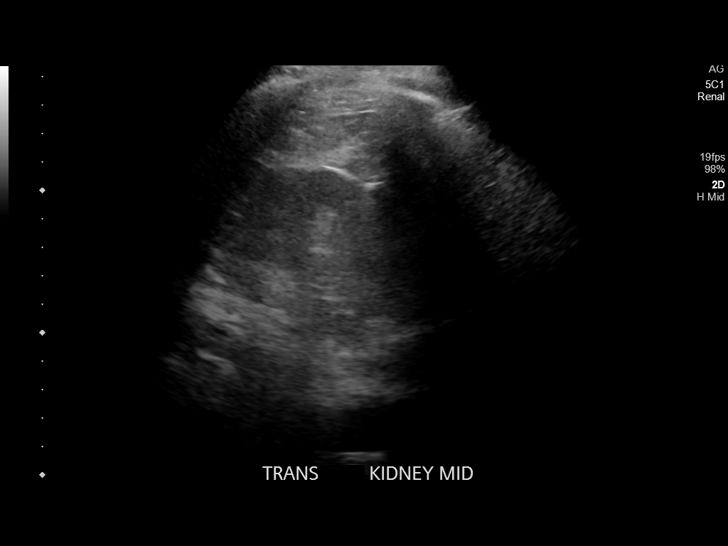
[im 16/21]
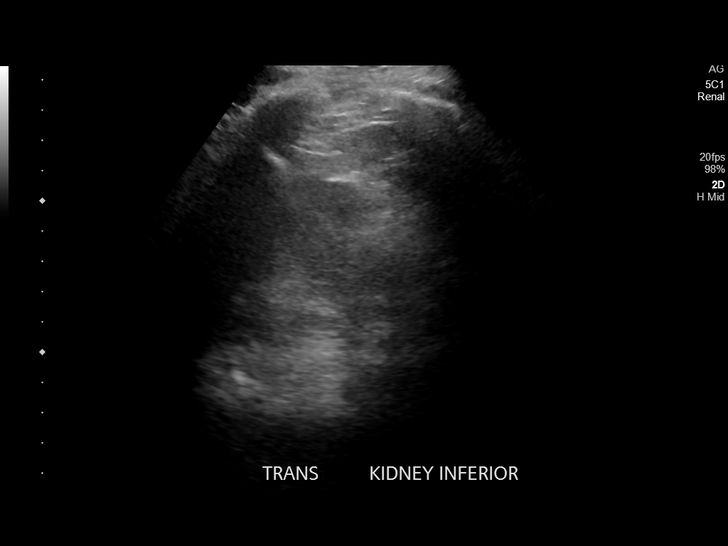
[im 18/21]
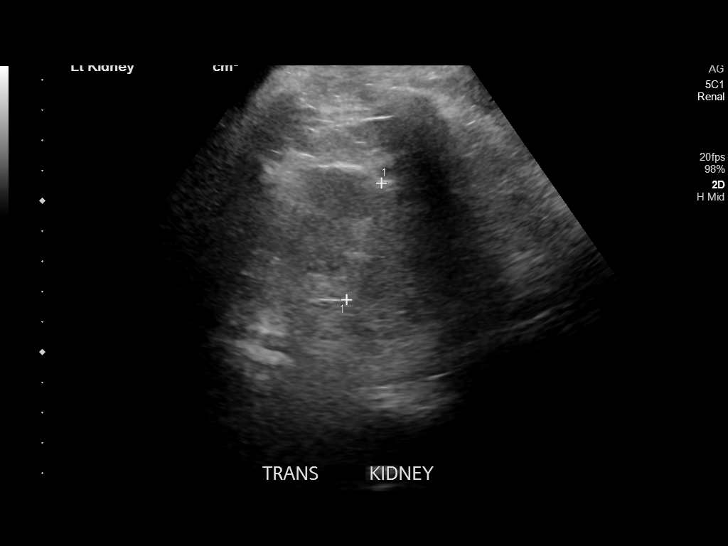
[im 19/21]
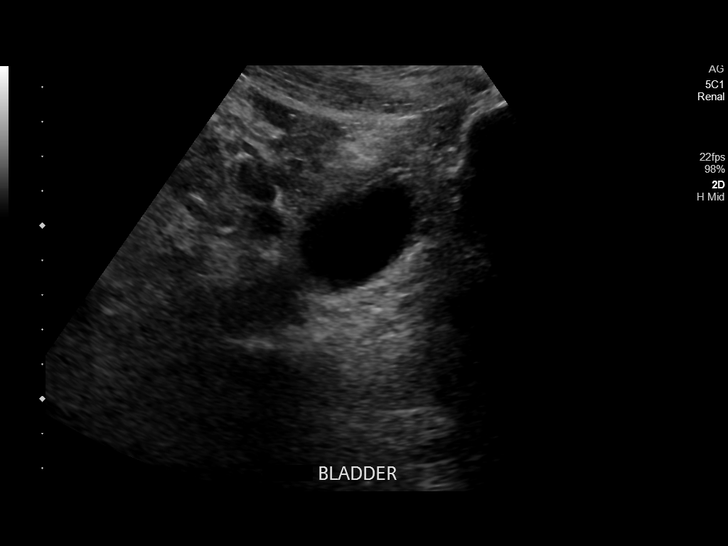
[im 21/21]
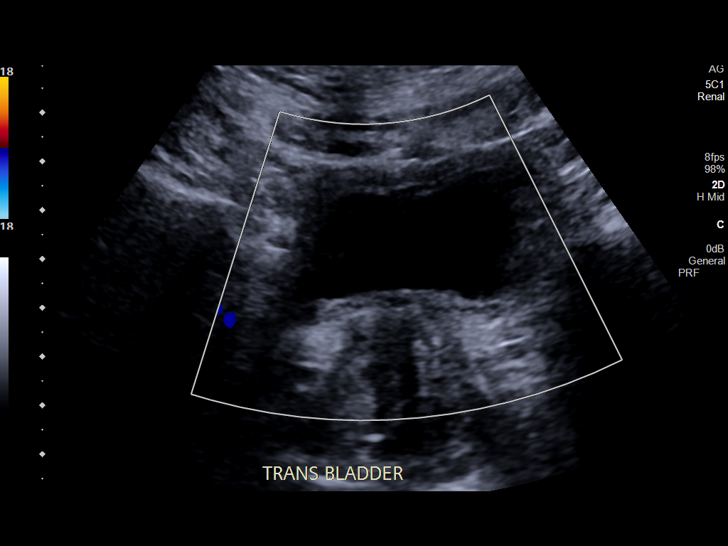

[14 of 21 positions shown; findings below may reference images not displayed]

FINDINGS: Right Kidney:

Renal measurements: 9.5 x 4.7 x 4.6 cm = volume: 108 mL. Borderline
increased parenchymal echogenicity. No hydronephrosis. No visualized
stone or focal lesion.

Left Kidney:

Renal measurements: 8.3 x 4.6 x 4.0 cm = volume: 81 mL. Limited
visualization due to overlying bowel gas and rib shadowing. No
hydronephrosis. More detailed assessment is limited.

Bladder:

Appears normal for degree of bladder distention.

Other:

None.
IMPRESSION: 1. No obstructive uropathy.
2. Limited evaluation of the left kidney due to overlying bowel gas
and rib shadowing.
3. Suggestion of mild increased right renal parenchymal echogenicity
suggesting chronic medical renal disease.

## 2020-12-01 NOTE — Progress Notes (Signed)
Update on patient condition given to sister, Denny Peon.

## 2020-12-01 NOTE — Evaluation (Signed)
Occupational Therapy Evaluation Patient Details Name: Nicholas Weiss MRN: [id number] DOB: 1950-06-19 Today's Date: 12/01/2020    History of Present Illness Nicholas Weiss is a 34yoM who comes to [facility] on 11/28/20 c hypoglemic episode and jerking. PMH: DM1, HTN, HLD, multiple myeloma, coacaine abuse. EEG and MRI in june for similar was unrevealing. Pt sustained a fall OOB to floor in prone on 9/2.   Clinical Impression   Pt seen for OT evaluation this date. Prior to admission, pt was independent in all ADLs and functional mobility, living in a 1-story home with girlfriend. Pt currently requires SUPERVISION for  bed mobility, seated LB dressing, standing grooming tasks, and functional mobility of household distances (~177f) with RW due to current functional impairments (See OT Problem List below). Pt would benefit from additional skilled OT services to maximize return to PLOF and minimize risk of future falls, injury, caregiver burden, and readmission. Upon discharge, recommend no OT follow-up.     Follow Up Recommendations  No OT follow up;Supervision - Intermittent    Equipment Recommendations  Other (comment) (2ww)       Precautions / Restrictions Precautions Precautions: Fall Restrictions Weight Bearing Restrictions: No      Mobility Bed Mobility Overal bed mobility: Needs Assistance Bed Mobility: Sit to Supine;Supine to Sit     Supine to sit: Supervision;HOB elevated Sit to supine: Supervision;HOB elevated   General bed mobility comments: Supervision for safety; use of BUE for support; increased time/effort    Transfers Overall transfer level: Needs assistance Equipment used: Rolling walker (2 wheeled) Transfers: Sit to/from Stand Sit to Stand: Supervision         General transfer comment: SUPERVISION to perform STS transfer with RW    Balance Overall balance assessment: Needs assistance Sitting-balance support: No upper extremity supported;Feet supported Sitting  balance-Leahy Scale: Good Sitting balance - Comments: Good sitting balance reaching outside BOS to don socks   Standing balance support: Bilateral upper extremity supported;During functional activity Standing balance-Leahy Scale: Fair Standing balance comment: SUPERVISION with b/l UE from RW                           ADL either performed or assessed with clinical judgement   ADL Overall ADL's : Needs assistance/impaired     Grooming: Wash/dry hands;Supervision/safety;Standing               Lower Body Dressing: Supervision/safety;Sitting/lateral leans Lower Body Dressing Details (indicate cue type and reason): to don socks sitting EOB             Functional mobility during ADLs: Supervision/safety;Rolling walker        Pertinent Vitals/Pain Pain Assessment: No/denies pain        Extremity/Trunk Assessment Upper Extremity Assessment Upper Extremity Assessment: Overall WFL for tasks assessed   Lower Extremity Assessment Lower Extremity Assessment: Overall WFL for tasks assessed   Cervical / Trunk Assessment Cervical / Trunk Assessment: Normal   Communication Communication Communication: No difficulties   Cognition Arousal/Alertness: Lethargic;Awake/alert Behavior During Therapy: WFL for tasks assessed/performed Overall Cognitive Status: Within Functional Limits for tasks assessed                                 General Comments: A&Ox4. Eyes closed during PLOF questions, but agreeable and pleasant throughout              [place] Private residence Living Arrangements: Spouse/significant other Available Help at Discharge: Family;Available 24 hours/day Type of Home: House Home Access: Stairs to enter [employer] of Steps: 1 STE bil railing Entrance Stairs-Rails: Can reach both Home Layout: One level     Bathroom Shower/Tub: Occupational psychologist:  Standard     Home Equipment: Cane - single point;Tub bench;Grab bars - toilet;Grab bars - tub/shower          Prior Functioning/Environment Level of Independence: Independent        Comments: Reports being Ind with all ADL's, IADL's, community ambulation without AD, medication management, and driving.        OT Problem List: Impaired balance (sitting and/or standing);Decreased activity tolerance      OT Treatment/Interventions: Self-care/ADL training;Therapeutic exercise;Energy conservation;DME and/or AE instruction;Therapeutic activities;Patient/family education;Balance training    OT Goals(Current goals can be found in the care plan section) Acute Rehab OT Goals Patient Stated Goal: to go gome OT Goal Formulation: With patient Time For Goal Achievement: 12/15/20 Potential to Achieve Goals: Good  OT Frequency: Min 1X/week    AM-PAC OT "6 Clicks" Daily Activity     Outcome Measure Help from another person eating meals?: None Help from another person taking care of personal grooming?: A Little Help from another person toileting, which includes using toliet, bedpan, or urinal?: A Little Help from another person bathing (including washing, rinsing, drying)?: A Little Help from another person to put on and taking off regular upper body clothing?: None Help from another person to put on and taking off regular lower body clothing?: A Little 6 Click Score: 20   End of Session Equipment Utilized During Treatment: Gait belt;Rolling walker Nurse Communication: Mobility status  Activity Tolerance: Patient tolerated treatment well Patient left: in bed;with call bell/phone within reach;with bed alarm set;with nursing/sitter in room  OT Visit Diagnosis: Unsteadiness on feet (R26.81);History of falling (Z91.81)                Time: 1045-1101 OT Time Calculation (min): 16 min Charges:  OT General Charges $OT Visit: 1 Visit OT Evaluation $OT Eval Moderate Complexity: [place], OTR/L [place]

## 2020-12-01 NOTE — Evaluation (Signed)
Physical Therapy Evaluation Patient Details Name: Nicholas Weiss MRN: [id number] DOB: 12-Jul-1950 Today's Date: 12/01/2020   History of Present Illness  Nicholas Weiss is a 70yoM who comes to [facility] on 11/28/20 c hypoglemic episode and jerking. PMH: DM1, HTN, HLD, multiple myeloma, coacaine abuse. EEG and MRI in june for similar was unrevealing. Pt sustained a fall OOB to floor in prone on 9/2.   Clinical Impression  Pt is a 70 year old M admitted to hospital on 11/28/20 for hypoglycemia. At baseline, pt was independent with all ADL's, IADL's, community ambulation without AD, and driving. Pt presents with decreased bil knee ext (lacking ~10-15deg), decreased standing balance, impaired safety awareness, intermittent lethargy, and mild decrease in strength in L elbow, resulting in impaired functional mobility from baseline. Due to deficits, pt required supervision for safety with bed mobility, CGA for safety with transfers, and CGA-min assist for gait with and without AD. Pt demonstrates improved safety, balance, assist levels, and quality of gait with use of RW vs. No AD. Deficits limit the pt's ability to safely and independently perform ADL's, transfer, and ambulate. Pt will benefit from acute skilled PT services to address deficits for return to baseline function. Pt appropriate to ambulate with nursing while hospitalized, with use of RW; pt and RN verbalized understanding. Balance deficits expected to improve with continued therapeutic intervention while hospitalized. At this time, PT recommends HHPT with initial 24/7 supervision. Pt will also benefit from RW to improve balance and for energy conservation with functional mobility.     Follow Up Recommendations Home health PT;Supervision/Assistance - 24 hour    Equipment Recommendations  Rolling walker with 5" wheels       Precautions / Restrictions Precautions Precautions: Fall Restrictions Weight Bearing Restrictions: No      Mobility  Bed  Mobility Overal bed mobility: Needs Assistance Bed Mobility: Sit to Supine;Supine to Sit     Supine to sit: Supervision;HOB elevated Sit to supine: Supervision;HOB elevated   General bed mobility comments: Supervision for safety; use of BUE for support; increased time/effort    Transfers Overall transfer level: Needs assistance Equipment used: None;Rolling walker (2 wheeled) Transfers: Sit to/from Stand Sit to Stand: Min guard         General transfer comment: CGA for safety to perform STS transfer with and without AD; increased reliance on momentum for facilitation; verbal cues for sequencing and hand placement with RW  Ambulation/Gait Ambulation/Gait assistance: Min guard;Min assist Gait Distance (Feet): 220 Feet (67f x1 without AD, 1633fx1 with RW) Assistive device: Rolling walker (2 wheeled);None       General Gait Details: Initially min assist for balance to ambulate without AD, demonstrating increased M/L sway, scissoring gait pattern, supination RLE, slowed cadence, narrow BOS, and decreased heel strike/toe off bil. Improved to CGA with RW; improved quality of gait with less M/L sway and improved step length/foot clearance.     Balance Overall balance assessment: Needs assistance Sitting-balance support: No upper extremity supported;Feet supported Sitting balance-Leahy Scale: Good     Standing balance support: No upper extremity supported;Bilateral upper extremity supported;During functional activity   Standing balance comment: Initially poor without AD, progressing to fair with AD      Pertinent Vitals/Pain Pain Assessment: No/denies pain    Home Living Family/patient expects to be discharged to:: Private residence Living Arrangements: Spouse/significant other Available Help at Discharge: Family;Available 24 hours/day Type of Home: House Home Access: Stairs to enter Entrance Stairs-Rails: Can reach both Entrance Stairs-Number of Steps: 1  STE bil  railing Home Layout: One level Home Equipment: Cane - single point;Tub bench;Grab bars - toilet;Grab bars - tub/shower      Prior Function Level of Independence: Independent         Comments: Reports being Ind with all ADL's, IADL's, community ambulation without AD, medication management, and driving.        Extremity/Trunk Assessment   Upper Extremity Assessment Upper Extremity Assessment: Overall WFL for tasks assessed (Grossly 4+/5, will decreased L elbow flex/ext at 4/5; sensation intact)    Lower Extremity Assessment Lower Extremity Assessment: Overall WFL for tasks assessed (Grossly 4+/5; limited bil knee ext by ~10-15deg (chronic); sensation intact; coordination intact)    Cervical / Trunk Assessment Cervical / Trunk Assessment: Normal  Communication   Communication: No difficulties  Cognition Arousal/Alertness: Lethargic;Awake/alert Behavior During Therapy: WFL for tasks assessed/performed Overall Cognitive Status: Within Functional Limits for tasks assessed      General Comments: A&O x4, able to follow 100% of simple 2 step commands         Exercises Other Exercises Other Exercises: Able to perform bed mobility, transfers, and gait. Improved balance/quality of gait with RW. Verbal cues for safety/sequencing. Other Exercises: Pt educated regarding: PT role/POC, DC recommendations, benefits/safety with RW, ambulating with nursing.   Assessment/Plan    PT Assessment Patient needs continued PT services  PT Problem List Decreased strength;Decreased range of motion;Decreased balance;Decreased mobility;Decreased safety awareness       PT Treatment Interventions Gait training;Stair training;Functional mobility training;Therapeutic activities;Therapeutic exercise;Balance training;Neuromuscular re-education    PT Goals (Current goals can be found in the Care Plan section)  Acute Rehab PT Goals Patient Stated Goal: "go home" PT Goal Formulation: With  patient Time For Goal Achievement: 12/15/20 Potential to Achieve Goals: Good    Frequency Min 2X/week    AM-PAC PT "6 Clicks" Mobility  Outcome Measure Help needed turning from your back to your side while in a flat bed without using bedrails?: None Help needed moving from lying on your back to sitting on the side of a flat bed without using bedrails?: A Little Help needed moving to and from a bed to a chair (including a wheelchair)?: None Help needed standing up from a chair using your arms (e.g., wheelchair or bedside chair)?: A Little Help needed to walk in hospital room?: A Little Help needed climbing 3-5 steps with a railing? : A Little 6 Click Score: 20    End of Session Equipment Utilized During Treatment: Gait belt Activity Tolerance: Patient tolerated treatment well Patient left: in bed;with call bell/phone within reach;with bed alarm set;with nursing/sitter in room Nurse Communication: Mobility status (amb with nursing with RW) PT Visit Diagnosis: Unsteadiness on feet (R26.81);History of falling (Z91.81);Difficulty in walking, not elsewhere classified (R26.2)    Time: 5573-2202 PT Time Calculation (min) (ACUTE ONLY): 19 min   Charges:   PT Evaluation $PT Eval Low Complexity: 1 Low PT Treatments $Gait Training: 8-22 mins      Herminio Commons, PT, DPT 10:24 AM,12/01/20

## 2020-12-01 NOTE — Progress Notes (Signed)
Inpatient Diabetes Program Recommendations  AACE/ADA: New Consensus Statement on Inpatient Glycemic Control (2015)  Target Ranges:  Prepandial:   less than 140 mg/dL      Peak postprandial:   less than 180 mg/dL (1-2 hours)      Critically ill patients:  140 - 180 mg/dL   Lab Results  Component Value Date   GLUCAP 264 (H) 12/01/2020   HGBA1C 10.8 (H) 09/11/2020    Review of Glycemic Control Results for Nicholas Weiss, Nicholas Weiss (MRN [id number]) as of 12/01/2020 08:09  Ref. Range 11/30/2020 09:36 11/30/2020 11:45 11/30/2020 13:45 11/30/2020 15:36 11/30/2020 16:42 11/30/2020 18:08 11/30/2020 20:10 11/30/2020 23:07 12/01/2020 07:33  Glucose-Capillary Latest Ref Range: 70 - 99 mg/dL 302 (H) 254 (H) 233 (H) 227 (H) 196 (H) 186 (H) 208 (H) 290 (H) 264 (H)   Diabetes history: DM type 1 Outpatient Diabetes medications: tresiba 20 units, novolog SSI upt to 14 units tid Current orders for Inpatient glycemic control:  Novolog 0-6 units tid  Inpatient Diabetes Program Recommendations:    Note glucose trends have increased:  Glucose trends mostly in the 200 range -  May consider starting a portion of basal insulin, Semglee 8 units.  See note from DM coordinator on 9/2.  Thanks,  Tama Headings RN, MSN, BC-ADM Inpatient Diabetes Coordinator Team Pager [phone] (8a-5p)

## 2020-12-01 NOTE — Plan of Care (Signed)

## 2020-12-01 NOTE — Progress Notes (Signed)
[facility] Progress Note  Patient: Nicholas Weiss    CWC:[id number]  DOA: 11/28/2020     Date of Service: the patient was seen and examined on 12/01/2020  Chief Complaint  Patient presents with   Hypoglycemia   Involuntary movements   Brief hospital course: Nicholas Weiss is a 70 y.o. male with a past medical history of type 1 insulin-dependent diabetes mellitus, hypertension, hyperlipidemia, multiple myeloma and chronic leukopenia, cocaine abuse.   Patient was recently admitted in June of this year for right-sided myoclonic jerking.  The patient underwent an extensive work-up including MRI of the brain and EEG which was largely unremarkable.  Neurology was consulted and it was concluded that this was related to possibly cocaine metabolites.   Upon EMS arrival for assessment of low blood sugars he was noted to have some intermittent myoclonic jerking movements that started today.  Most of the history provided by ER doctor as when I examined he is sedated after receiving Ativan and Haldol to obtain CT imaging.  Per ED provider, the patient states he sometimes gets these movements now and then.  They are much worse today.  He denies any urinary or bowel incontinence or loss of consciousness.  He denies any tongue biting.  Not on any blood thinners.  His blood sugar in the emergency department was noted to be as low as 42.  He was given an amp of D50 and started on a D5 drip.  The patient told the ED provider that he took his usual dose of insulin and ate a regular breakfast this morning. Per ED provider, he denies any chest pain, abdominal pain, nausea, vomiting, diarrhea.  He states he last used cocaine on Monday.  Denies any other illicit drug use or alcohol use.   Upon my exam he is still having myoclonic jerking primarily of the upper extremities bilaterally.  He required sedation with Ativan and Haldol by the ED provider to obtain CT imaging.   ED Course: Chest was negative.  CT C-spine  showed no acute findings.  His creatinine is elevated at 2.29.  Review of his chart shows a baseline creatinine 1.4-1.8.  Repeat blood sugar in the 130s.  WBC at 2.48 hemoglobin at 10 and hematocrit 29.  Vital signs are stable.   Assessment and Plan:  Agitation and delirium, noticed on 9/3 Patient was given Haldol 5 mg IM x1 dose Did, recommended Haldol 2 mg IV every 6 hourly as needed and gabapentin 20 mg p.o. 3 times daily for drug abuse withdrawal symptoms We will continue to follow psych for further recommendation 9/4 today patient is AOx3, following commands and behaving appropriately, RN was advised to keep close eye without one-to-one sitter   Metabolic encephalopathy Patient fell out of the bed and hit his head, CT head negative for any acute pathology EEG negative for seizures, shows encephalopathy ABG negative for hypercapnia, ammonia level 13 within normal range Supportive care and fall precautions Continue IV fluid for hydration Started thiamine IV 500 milligrams daily for 3 days  AKI, most likely prerenal due to dehydration Given lactated Ringer's 1 L bolus in the emergency department.   Cr 2.29--1.34--1.51 --1.72 slightly trending up Patient was on IV fluid, he pulled out IV line which was reinserted and IV fluid was restarted Creatinine is trending up We will check bladder scan periodically  Follow renal sonogram Monitor renal functions and urine output daily Encouraged for oral hydration   Persistent hypoglycemia: Given 1 amp of D50  in the emergency department now on a D5 drip.  Start Accu-Cheks every 2 hours.  If blood sugars start rising discontinue the D5 drip.   We will hold home insulin regimen including Tresiba and NovoLog sliding scale insulin before meals.   Hypoglycemia resolved, Resumed diet  Continue aspiration precautions    Type 1 insulin-dependent diabetes mellitus: Holding home [name] and NovoLog sliding scale insulin before meals   Hypertension:  Continue home amlodipine, clonidine patch Use IV Lopressor as needed and IV hydralazine as needed    Chronic leukopenia: No acute treatment   Hyperlipidemia: Continue home lovastatin   Neuropathy: Continue home Lyrica   Cocaine use: Counsel on cessation UDS positive for cocaine and THC   Episodic myoclonic jerking: UDS pending. Can discuss with neurology in the morning if extensive work-up should be obtained again with a EEG and MRI as he recently had similar symptomatology in June and extensive work-up was negative and was concluded that this was due to cocaine metabolites.  The patient told the ED provider last time he used cocaine was on Monday.  Ativan as needed ordered for any further episodic myoclonic jerking. EEG: consistent with a mild generalized non-specific cerebral dysfunction(encephalopathy). There was no seizure or seizure predisposition recorded on this study. Please note that lack of epileptiform activity on EEG does not preclude the possibility of epilepsy.    Body mass index is 25.32 kg/m.  Interventions:   9/2 family requested to transfer patient to [facility] for higher level of care, patient's girlfriend was not satisfied as patient fell in the room when he was just transferred from the ED.  [facility] does not have beds at this time, suggested to call back in the morning.  9/3 called again today at [facility] for transfer, case is open.  Awaiting for callback 9/3 w/d [facility] hospitalist Dr. Pincus Sanes who did not accept the patient because patient does not need higher level of care/tertiary center and currently they are at the capacity and not accepting transfers at family request.     Diet: Carb modified diet DVT Prophylaxis: Subcutaneous Heparin    Advance goals of care discussion: Full code  Family Communication: family was NOT present at bedside, at the time of interview.  The pt could not provided permission to discuss medical plan with the family.   Disposition:  Pt is from  Home, admitted with hypoglycemia, AMS, AKI still has AKI, which precludes a safe discharge. Discharge to Home with HTPT, when medically stable, may be in 2 to 3 days.   Subjective: No significant overnight issues, yesterday patient was agitated today patient is behaving nicely.  Patient received Haldol yesterday, monitored with one-to-one sitter in the room.  Today patient is much better.  AAOx3, encephalopathy resolved. Patient was unable to offer any complaints,   Physical Exam: General: Awake and alert, AAO x3, still not fully awake Appear in no distress,  Eyes: EOMI  ENT: Oral Mucosa Clear, moist  Neck: no JVD,  Cardiovascular: S1 and S2 Present, no Murmur,  Respiratory: good respiratory effort, Bilateral Air entry equal and Decreased, no Crackles, no wheezes Abdomen: Bowel Sound present, Soft and no tenderness,  Skin: no rashes Extremities: no Pedal edema, no calf tenderness Neurologic: CN grossly intact, no focal deficits  Gait not checked due to patient safety concerns  Vitals:   11/30/20 1523 11/30/20 2042 12/01/20 0441 12/01/20 0753  BP: 135/80 (!) 132/92 (!) 145/88 137/90  Pulse: 95 85 89 90  Resp: 18 18 16  18  Temp: 98.3 F (36.8 C) (!) 97.5 F (36.4 C) 97.9 F (36.6 C) (!) 97.5 F (36.4 C)  TempSrc: Oral Oral Oral Oral  SpO2: 100% 99% 99% 100%  Weight:   77 kg   Height:        Intake/Output Summary (Last 24 hours) at 12/01/2020 1225 Last data filed at 12/01/2020 1044 Gross per 24 hour  Intake 0 ml  Output 300 ml  Net -300 ml   Filed Weights   11/28/20 1416 11/30/20 0407 12/01/20 0441  Weight: 81.2 kg 74.5 kg 77 kg    Data Reviewed: I have personally reviewed and interpreted daily labs, tele strips, imagings as discussed above. I reviewed all nursing notes, pharmacy notes, vitals, pertinent old records I have discussed plan of care as described above with RN and patient/family.  CBC: Recent Labs  Lab 11/28/20 1419 11/28/20 1800 11/29/20 0703  11/30/20 0444 12/01/20 0432  WBC 2.4* 3.0* 2.4* 2.5* 1.8*  1.8*  NEUTROABS  --  1.7  --   --  0.9*  HGB 10.1* 10.0* 11.2* 12.6* 11.1*  10.9*  HCT 29.8* 30.4* 33.7* 37.6* 32.5*  32.7*  MCV 92.3 93.5 92.1 90.4 91.3  89.8  PLT 138* 130* 136* 133* 135*  448*   Basic Metabolic Panel: Recent Labs  Lab 11/28/20 1419 11/29/20 0703 11/29/20 1018 11/30/20 0444 12/01/20 0432  NA 135 131*  --  137 137  K 4.1 4.2  --  5.0 4.2  CL 104 101  --  105 111  CO2 25 25  --  23 21*  GLUCOSE 130* 370*  --  279* 204*  BUN 33* 24*  --  26* 34*  CREATININE 2.29* 1.34*  --  1.51* 1.72*  CALCIUM 8.4* 8.1*  --  8.2* 8.0*  MG  --   --  1.8 1.9 1.9  PHOS  --   --  2.9 2.7 2.8    Studies: No results found.  Scheduled Meds:  amLODipine  10 mg Oral Daily   aspirin  81 mg Oral Daily   cloNIDine  0.2 mg Transdermal Weekly   gabapentin  200 mg Oral TID   haloperidol  2 mg Oral BID   heparin  5,000 Units Subcutaneous Q8H   insulin aspart  0-6 Units Subcutaneous TID WC   Continuous Infusions:  sodium chloride 75 mL/hr at 12/01/20 1025   dextrose Stopped (11/29/20 0838)   thiamine injection 500 mg (11/30/20 1354)   PRN Meds: acetaminophen **OR** acetaminophen, haloperidol lactate, hydrALAZINE, HYDROcodone-acetaminophen, metoprolol tartrate, morphine injection, ondansetron **OR** ondansetron (ZOFRAN) IV, polyethylene glycol  Time spent: 35 minutes  Author: Val Riles. MD [facility] 12/01/2020 12:25 PM  To reach On-call, see care teams to locate the attending and reach out to them via [email or web address]. If 7PM-7AM, please contact night-coverage If you still have difficulty reaching the attending provider, please page the [facility] (Director on Call) for [facility] on amion for assistance.

## 2020-12-01 NOTE — TOC Progression Note (Signed)
Transition of Care [facility]) - Progression Note    Patient Details  Name: Nicholas Weiss MRN: [id number] Date of Birth: 05-08-1950  Transition of Care [facility]) CM/SW [place], LCSW Phone Number: 12/01/2020, 10:05 AM  Clinical Narrative:   PT recommends RW and HHPT. Called Nicholas Weiss who is agreeable. No agency preference. RW ordered through [place]. HHPT referral to [facility] with [place]. Confirmed home address in chart with Nicholas Weiss.    Expected Discharge Plan: Home/Self Care Barriers to Discharge: Continued Medical Work up  Expected Discharge Plan and Services Expected Discharge Plan: Home/Self Care       Living arrangements for the past 2 months: Single Family Home                                       Social Determinants of Health (SDOH) Interventions    Readmission Risk Interventions Readmission Risk Prevention Plan 11/30/2020  Transportation Screening Complete  Medication Review Press photographer) Complete  PCP or Specialist appointment within 3-5 days of discharge Complete  HRI or Home Care Consult Complete  SW Recovery Care/Counseling Consult Complete  [place] Complete  Some recent data might be hidden

## 2020-12-02 DIAGNOSIS — N179 Acute kidney failure, unspecified: Secondary | ICD-10-CM

## 2020-12-02 DIAGNOSIS — E162 Hypoglycemia, unspecified: Secondary | ICD-10-CM | POA: Diagnosis not present

## 2020-12-02 DIAGNOSIS — N1832 Chronic kidney disease, stage 3b: Secondary | ICD-10-CM | POA: Diagnosis not present

## 2020-12-02 DIAGNOSIS — G9341 Metabolic encephalopathy: Secondary | ICD-10-CM

## 2020-12-02 DIAGNOSIS — E1040 Type 1 diabetes mellitus with diabetic neuropathy, unspecified: Secondary | ICD-10-CM

## 2020-12-02 HISTORY — DX: Metabolic encephalopathy: G93.41

## 2020-12-02 LAB — GLUCOSE, CAPILLARY
Glucose-Capillary: 385 mg/dL — ABNORMAL HIGH (ref 70–99)
Glucose-Capillary: 351 mg/dL — ABNORMAL HIGH (ref 70–99)
Glucose-Capillary: 210 mg/dL — ABNORMAL HIGH (ref 70–99)
Glucose-Capillary: 44 mg/dL — CL (ref 70–99)
Glucose-Capillary: 49 mg/dL — ABNORMAL LOW (ref 70–99)
Glucose-Capillary: 163 mg/dL — ABNORMAL HIGH (ref 70–99)

## 2020-12-02 LAB — CBC
HCT: 30.8 % — ABNORMAL LOW (ref 39.0–52.0)
Hemoglobin: 10.3 g/dL — ABNORMAL LOW (ref 13.0–17.0)
MCH: 30.8 pg (ref 26.0–34.0)
MCHC: 33.4 g/dL (ref 30.0–36.0)
MCV: 92.2 fL (ref 80.0–100.0)
Platelets: 112 10*3/uL — ABNORMAL LOW (ref 150–400)
RBC: 3.34 MIL/uL — ABNORMAL LOW (ref 4.22–5.81)
RDW: 13.9 % (ref 11.5–15.5)
WBC: 2.3 10*3/uL — ABNORMAL LOW (ref 4.0–10.5)
nRBC: 0 % (ref 0.0–0.2)

## 2020-12-02 LAB — MAGNESIUM: Magnesium: 1.6 mg/dL — ABNORMAL LOW (ref 1.7–2.4)

## 2020-12-02 LAB — BASIC METABOLIC PANEL
Anion gap: 6 (ref 5–15)
BUN: 36 mg/dL — ABNORMAL HIGH (ref 8–23)
CO2: 21 mmol/L — ABNORMAL LOW (ref 22–32)
Calcium: 7.8 mg/dL — ABNORMAL LOW (ref 8.9–10.3)
Chloride: 104 mmol/L (ref 98–111)
Creatinine, Ser: 1.78 mg/dL — ABNORMAL HIGH (ref 0.61–1.24)
GFR, Estimated: 41 mL/min — ABNORMAL LOW (ref >60)
Glucose, Bld: 420 mg/dL — ABNORMAL HIGH (ref 70–99)
Potassium: 5.3 mmol/L — ABNORMAL HIGH (ref 3.5–5.1)
Sodium: 131 mmol/L — ABNORMAL LOW (ref 135–145)

## 2020-12-02 LAB — PHOSPHORUS: Phosphorus: 2.7 mg/dL (ref 2.5–4.6)

## 2020-12-02 MED ORDER — INSULIN ASPART 100 UNIT/ML IJ SOLN
5.0000 [IU] | Freq: Three times a day (TID) | INTRAMUSCULAR | Status: DC
  Administered 2020-12-02 – 2020-12-04 (×5): 5 [IU] via SUBCUTANEOUS
  Filled 2020-12-02 (×4): qty 1

## 2020-12-02 MED ORDER — INSULIN ASPART 100 UNIT/ML IJ SOLN: 7.0000 [IU] | Freq: Three times a day (TID) | INTRAMUSCULAR | Status: DC

## 2020-12-02 MED ORDER — INSULIN ASPART 100 UNIT/ML IJ SOLN
0.0000 [IU] | Freq: Three times a day (TID) | INTRAMUSCULAR | Status: DC
  Administered 2020-12-02: 15 [IU] via SUBCUTANEOUS
  Administered 2020-12-02: 5 [IU] via SUBCUTANEOUS
  Filled 2020-12-02: qty 1

## 2020-12-02 MED ORDER — MAGNESIUM SULFATE 2 GM/50ML IV SOLN
2.0000 g | Freq: Once | INTRAVENOUS | Status: AC
  Administered 2020-12-02: 2 g via INTRAVENOUS
  Filled 2020-12-02: qty 50

## 2020-12-02 MED ORDER — INSULIN GLARGINE-YFGN 100 UNIT/ML ~~LOC~~ SOLN
15.0000 [IU] | Freq: Every day | SUBCUTANEOUS | Status: DC
  Administered 2020-12-02 – 2020-12-03 (×2): 15 [IU] via SUBCUTANEOUS
  Filled 2020-12-02 (×3): qty 0.15

## 2020-12-02 NOTE — Progress Notes (Signed)
B Morrison notified blood sugar49, 44, pt eating [employer], no treatment ordered due to patient eating, blood sugar will be rechecked in 1 hr.

## 2020-12-02 NOTE — Progress Notes (Addendum)
PROGRESS NOTE    Nicholas Weiss  ZTI:[id number] DOB: 02-23-1951 DOA: 11/28/2020 PCP: Birdie Sons, MD   Brief Narrative: Nicholas Weiss is a 70 y.o. male with a history of type 1 diabetes mellitus, hypertension, hyperlipidemia, multiple myeloma, current leukopenia, cocaine abuse.  Patient presented secondary to hypoglycemia and involuntary movements.  He was initially taken off insulin and given D5 IV fluids with improvement of his hyperglycemia.  While admitted, he developed delirium/agitation requiring EEG without evidence of seizure activity.  Now improved.   Assessment & Plan:   Active Problems:   Type 1 diabetes mellitus with diabetic neuropathy, unspecified (HCC)   AKI (acute kidney injury) ([place])   CKD (chronic kidney disease) stage 3, GFR 30-59 ml/min (HCC)   Hypoglycemia   Cocaine abuse ([place])   Acute metabolic encephalopathy   Metabolic encephalopathy Agitation Delirium Patient was managed with Haldol IV as needed.  Psychiatry was consulted with recommendations for haldol 2 mg BID scheduled.  Patient received empiric treatment with thiamine IV 500 milligrams daily x3 days. Unsure etiology but resolved. -Continue Haldol  2 mg BID but will need to confirm discharge medication with psychiatry  AKI on CKD IIIb Difficult to assess baseline but appears to be around 1.6. Creatinine of 2.29 on admission. Resolved with IV fluids  Hypoglycemia Unsure of etiology. Patient required 1 amp of D50 in the ED and was placed on D5 water followed by D10 water and insulin was discontinued. Resolved.  Fall Occurred while patient was confused. CT head unremarkable for acute injury.  Chronic anemia Normocytic.  Recent poorly prepped colonoscopy (2021) with evidence of nonbleeding hemorrhoids.  Multiple gastric ulcers seen on recent upper endoscopy (2021).  Hemoglobin is currently stable no evidence of bleeding.  Iron panel does not appear to suggest iron deficiency anemia but there is  no ferritin ordered. -Ferritin in AM  Diabetes mellitus, type 1 Patient is on Tresiba 20 units daily as an outpatient. Patient requires basal insulin at all times. No evidence of DKA at this time. -Restart long acting insulin with Semglee 15 units daily and titrate up as needed -Increase to SSI moderate scale -Start Novolog 5 units TID with meals and titrate as needed  Primary hypertension Patient is on lisinopril and amlodipine as an outpatient.  Hyperlipidemia Patient is on lovastatin as outpatient.  Diabetic neuropathy Pregablin held presumably secondary to altered mental status/AKI vs myoclonic jerking in setting of AKI. Started on gabapentin 200 mg TID. -Continue gabapentin for now but can likely resume home pregablin on discharge.  Hypomagnesemia -IV magnesium replacement  Chronic leukopenia Neutropenia Afebrile. Appears to be new from this year. Pathology smear review (9/4) is pending. [place].  Polysubstance abuse UDS positive for cocaine and THC. Counseled this admission.  Myoclonic jerking EEG without evidence of seizure activity.    DVT prophylaxis: Subcutaneous heparin Code Status:   Code Status: Full Code Family Communication: None at bedside Disposition Plan: Discharge home likely in 1 to 2 days pending improvement of blood sugar, psychiatry recommendations   Consultants:  Psychiatry  Procedures:  EEG (11/29/2020) Clinical Interpretation: This EEG is consistent with a mild generalized non-specific cerebral dysfunction(encephalopathy). There was no seizure or seizure predisposition recorded on this study. Please note that lack of epileptiform activity on EEG does not preclude the possibility of epilepsy.   Antimicrobials: None   Subjective: Patient reports no issues this morning.  Objective: Vitals:   12/02/20 0349 12/02/20 0500 12/02/20 0803 12/02/20 1524  BP: (!) 145/82  127/81  112/62  Pulse: 92  83 83  Resp: 16  18 18   Temp: 98 F (36.7 C)  98.7  F (37.1 C) 98.6 F (37 C)  TempSrc: Oral  Oral   SpO2: 100%  100% 99%  Weight:  77.3 kg    Height:        Intake/Output Summary (Last 24 hours) at 12/02/2020 1645 Last data filed at 12/02/2020 1507 Gross per 24 hour  Intake 5089.82 ml  Output 1100 ml  Net 3989.82 ml   Filed Weights   11/30/20 0407 12/01/20 0441 12/02/20 0500  Weight: 74.5 kg 77 kg 77.3 kg    Examination:  General exam: Appears calm and comfortable Respiratory system: Clear to auscultation. Respiratory effort normal. Cardiovascular system: S1 & S2 heard, RRR. No murmurs, rubs, gallops or clicks. Gastrointestinal system: Abdomen is nondistended, soft and nontender. No organomegaly or masses felt. Normal bowel sounds heard. Central nervous system: Alert. No focal neurological deficits. Musculoskeletal: No edema. No calf tenderness Skin: No cyanosis. No rashes    Data Reviewed: I have personally reviewed following labs and imaging studies  CBC Lab Results  Component Value Date   WBC 2.3 (L) 12/02/2020   RBC 3.34 (L) 12/02/2020   HGB 10.3 (L) 12/02/2020   HCT 30.8 (L) 12/02/2020   MCV 92.2 12/02/2020   MCH 30.8 12/02/2020   PLT 112 (L) 12/02/2020   MCHC 33.4 12/02/2020   RDW 13.9 12/02/2020   LYMPHSABS 0.7 12/01/2020   MONOABS 0.2 12/01/2020   EOSABS 0.0 12/01/2020   BASOSABS 0.0 03/50/0938     Last metabolic panel Lab Results  Component Value Date   NA 131 (L) 12/02/2020   K 5.3 (H) 12/02/2020   CL 104 12/02/2020   CO2 21 (L) 12/02/2020   BUN 36 (H) 12/02/2020   CREATININE 1.78 (H) 12/02/2020   GLUCOSE 420 (H) 12/02/2020   GFRNONAA 41 (L) 12/02/2020   GFRAA 54 (L) 03/15/2020   CALCIUM 7.8 (L) 12/02/2020   PHOS 2.7 12/02/2020   PROT 9.1 (H) 10/03/2020   ALBUMIN 3.3 (L) 10/03/2020   LABGLOB 5.8 (H) 10/03/2020   AGRATIO 0.6 (L) 10/03/2020   BILITOT 0.4 10/03/2020   ALKPHOS 55 10/03/2020   AST 13 10/03/2020   ALT 14 10/03/2020   ANIONGAP 6 12/02/2020    CBG (last 3)  Recent Labs     12/02/20 0724 12/02/20 1137 12/02/20 1632  GLUCAP 385* 351* 210*     GFR: Estimated Creatinine Clearance: 42.3 mL/min (A) (by C-G formula based on SCr of 1.78 mg/dL (H)).  Coagulation Profile: No results for input(s): INR, PROTIME in the last 168 hours.  Recent Results (from the past 240 hour(s))  Resp Panel by RT-PCR (Flu A&B, Covid)     Status: None   Collection Time: 11/29/20  1:39 AM  Result Value Ref Range Status   SARS Coronavirus 2 by RT PCR NEGATIVE NEGATIVE Final    Comment: (NOTE) SARS-CoV-2 target nucleic acids are NOT DETECTED.  The SARS-CoV-2 RNA is generally detectable in upper respiratory specimens during the acute phase of infection. The lowest concentration of SARS-CoV-2 viral copies this assay can detect is 138 copies/mL. A negative result does not preclude SARS-Cov-2 infection and should not be used as the sole basis for treatment or other patient management decisions. A negative result may occur with  improper specimen collection/handling, submission of specimen other than nasopharyngeal swab, presence of viral mutation(s) within the areas targeted by this assay, and inadequate number of viral copies(<138  copies/mL). A negative result must be combined with clinical observations, patient history, and epidemiological information. The expected result is Negative.  Fact Sheet for Patients:  EntrepreneurPulse.com.au  Fact Sheet for Healthcare Providers:  IncredibleEmployment.be  This test is no t yet approved or cleared by the [place] FDA and  has been authorized for detection and/or diagnosis of SARS-CoV-2 by FDA under an Emergency Use Authorization (EUA). This EUA will remain  in effect (meaning this test can be used) for the duration of the COVID-19 declaration under Section 564(b)(1) of the Act, 21 U.S.C.section 360bbb-3(b)(1), unless the authorization is terminated  or revoked sooner.       Influenza A by  PCR NEGATIVE NEGATIVE Final   Influenza B by PCR NEGATIVE NEGATIVE Final    Comment: (NOTE) The Xpert Xpress SARS-CoV-2/FLU/RSV plus assay is intended as an aid in the diagnosis of influenza from Nasopharyngeal swab specimens and should not be used as a sole basis for treatment. Nasal washings and aspirates are unacceptable for Xpert Xpress SARS-CoV-2/FLU/RSV testing.  Fact Sheet for Patients: EntrepreneurPulse.com.au  Fact Sheet for Healthcare Providers: IncredibleEmployment.be  This test is not yet approved or cleared by the [place] FDA and has been authorized for detection and/or diagnosis of SARS-CoV-2 by FDA under an Emergency Use Authorization (EUA). This EUA will remain in effect (meaning this test can be used) for the duration of the COVID-19 declaration under Section 564(b)(1) of the Act, 21 U.S.C. section 360bbb-3(b)(1), unless the authorization is terminated or revoked.  Performed at [facility], [address] [place], [place] [address]         Radiology Studies: US RENAL  Result Date: 12/01/2020 CLINICAL DATA:  Acute kidney injury. EXAM: RENAL / URINARY TRACT ULTRASOUND COMPLETE COMPARISON:  Renal ultrasound 08/02/2020 FINDINGS: Right Kidney: Renal measurements: 9.5 x 4.7 x 4.6 cm = volume: 108 mL. Borderline increased parenchymal echogenicity. No hydronephrosis. No visualized stone or focal lesion. Left Kidney: Renal measurements: 8.3 x 4.6 x 4.0 cm = volume: 81 mL. Limited visualization due to overlying bowel gas and rib shadowing. No hydronephrosis. More detailed assessment is limited. Bladder: Appears normal for degree of bladder distention. Other: None. IMPRESSION: 1. No obstructive uropathy. 2. Limited evaluation of the left kidney due to overlying bowel gas and rib shadowing. 3. Suggestion of mild increased right renal parenchymal echogenicity suggesting chronic medical renal disease. Electronically Signed   By:  Keith Rake M.D.   On: 12/01/2020 18:30        Scheduled Meds:  amLODipine  10 mg Oral Daily   aspirin  81 mg Oral Daily   cloNIDine  0.2 mg Transdermal Weekly   gabapentin  200 mg Oral TID   haloperidol  2 mg Oral BID   heparin  5,000 Units Subcutaneous Q8H   insulin aspart  0-15 Units Subcutaneous TID WC   insulin aspart  5 Units Subcutaneous TID WC   insulin glargine-yfgn  15 Units Subcutaneous Daily   Continuous Infusions:  sodium chloride 75 mL/hr at 12/02/20 1507     LOS: 3 days     Cordelia Poche, MD [facility] 12/02/2020, 4:45 PM  If 7PM-7AM, please contact night-coverage [email or web address]

## 2020-12-02 NOTE — Care Management Important Message (Signed)
Important Message  Patient Details  Name: Nicholas Weiss MRN: [id number] Date of Birth: 1950-06-02   Medicare Important Message Given:  Yes     Juliann Pulse A Shavonte Zhao 12/02/2020, 1:55 PM

## 2020-12-03 DIAGNOSIS — G9341 Metabolic encephalopathy: Secondary | ICD-10-CM

## 2020-12-03 DIAGNOSIS — E162 Hypoglycemia, unspecified: Secondary | ICD-10-CM | POA: Diagnosis not present

## 2020-12-03 DIAGNOSIS — N179 Acute kidney failure, unspecified: Secondary | ICD-10-CM | POA: Diagnosis not present

## 2020-12-03 DIAGNOSIS — N1832 Chronic kidney disease, stage 3b: Secondary | ICD-10-CM | POA: Diagnosis not present

## 2020-12-03 LAB — GLUCOSE, CAPILLARY
Glucose-Capillary: 169 mg/dL — ABNORMAL HIGH (ref 70–99)
Glucose-Capillary: 191 mg/dL — ABNORMAL HIGH (ref 70–99)
Glucose-Capillary: 105 mg/dL — ABNORMAL HIGH (ref 70–99)
Glucose-Capillary: 185 mg/dL — ABNORMAL HIGH (ref 70–99)

## 2020-12-03 LAB — BASIC METABOLIC PANEL
Anion gap: 6 (ref 5–15)
BUN: 35 mg/dL — ABNORMAL HIGH (ref 8–23)
CO2: 23 mmol/L (ref 22–32)
Calcium: 8.1 mg/dL — ABNORMAL LOW (ref 8.9–10.3)
Chloride: 108 mmol/L (ref 98–111)
Creatinine, Ser: 1.42 mg/dL — ABNORMAL HIGH (ref 0.61–1.24)
GFR, Estimated: 54 mL/min — ABNORMAL LOW (ref >60)
Glucose, Bld: 161 mg/dL — ABNORMAL HIGH (ref 70–99)
Potassium: 4.2 mmol/L (ref 3.5–5.1)
Sodium: 137 mmol/L (ref 135–145)

## 2020-12-03 LAB — CBC
HCT: 28.2 % — ABNORMAL LOW (ref 39.0–52.0)
Hemoglobin: 9.7 g/dL — ABNORMAL LOW (ref 13.0–17.0)
MCH: 30.7 pg (ref 26.0–34.0)
MCHC: 34.4 g/dL (ref 30.0–36.0)
MCV: 89.2 fL (ref 80.0–100.0)
Platelets: 110 10*3/uL — ABNORMAL LOW (ref 150–400)
RBC: 3.16 MIL/uL — ABNORMAL LOW (ref 4.22–5.81)
RDW: 13.8 % (ref 11.5–15.5)
WBC: 1.9 10*3/uL — ABNORMAL LOW (ref 4.0–10.5)
nRBC: 0 % (ref 0.0–0.2)

## 2020-12-03 LAB — PATHOLOGIST SMEAR REVIEW

## 2020-12-03 LAB — FERRITIN: Ferritin: 212 ng/mL (ref 24–336)

## 2020-12-03 LAB — MAGNESIUM: Magnesium: 1.9 mg/dL (ref 1.7–2.4)

## 2020-12-03 NOTE — Consult Note (Signed)
[facility] Face-to-Face Psychiatry Consult   Reason for Consult: Consult follow-up 70 year old man with diabetes who had been seen earlier in his hospital stay for delirium Referring Physician: Lonny Prude Patient Identification: Nicholas Weiss MRN:  [id number] Principal Diagnosis: <principal problem not specified> Diagnosis:  Active Problems:   Type 1 diabetes mellitus with diabetic neuropathy, unspecified ([place])   AKI (acute kidney injury) ([place])   CKD (chronic kidney disease) stage 3, GFR 30-59 ml/min ([facility])   Hypoglycemia   Cocaine abuse ([place])   Acute metabolic encephalopathy   Total Time spent with patient: 30 minutes  Subjective:   Nicholas Weiss is a 70 y.o. male patient admitted with "I am feeling fine".  HPI: Following up on earlier consult visits.  Patient had been agitated and delirious when he acutely came into the hospital.  With improvements in his medical condition his mental status has improved greatly.  Found patient awake alert and communicative.  Fully oriented.  Denying any current symptoms.  Medically stabilizing probably ready to be discharged soon.  Past Psychiatric History: No past psychiatric history.  Risk to Self:   Risk to Others:   Prior Inpatient Therapy:   Prior Outpatient Therapy:    Past Medical History:  Past Medical History:  Diagnosis Date   DKA (diabetic ketoacidoses) 04/06/2016   Hypercholesteremia    Hypertension     Past Surgical History:  Procedure Laterality Date   COLONOSCOPY WITH PROPOFOL N/A 02/01/2020   Procedure: COLONOSCOPY WITH PROPOFOL;  Surgeon: Lin Landsman, MD;  Location: [facility] ENDOSCOPY;  Service: Gastroenterology;  Laterality: N/A;   ESOPHAGOGASTRODUODENOSCOPY  02/01/2020   Procedure: ESOPHAGOGASTRODUODENOSCOPY (EGD);  Surgeon: Lin Landsman, MD;  Location: [facility] ENDOSCOPY;  Service: Gastroenterology;;   KNEE SURGERY Right    Torn meniscus   KNEE SURGERY Left    Family History:  Family History  Problem Relation Age of  Onset   Heart attack Father    Hypertension Sister    Cancer Sister    Family Psychiatric  History: See previous Social History:  Social History   Substance and Sexual Activity  Alcohol Use Yes   Alcohol/week: 0.0 - 1.0 standard drinks   Comment: "once every 2 months"     Social History   Substance and Sexual Activity  Drug Use Not Currently   Types: Marijuana   Comment: "none in a long time'    Social History   Socioeconomic History   Marital status: Single    Spouse name: Not on file   Number of children: 3   Years of education: Not on file   Highest education level: High school graduate  Occupational History    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  Tobacco Use   Smoking status: Never   Smokeless tobacco: Never  Vaping Use   Vaping Use: Never used  Substance and Sexual Activity   Alcohol use: Yes    Alcohol/week: 0.0 - 1.0 standard drinks    Comment: "once every 2 months"   Drug use: Not Currently    Types: Marijuana    Comment: "none in a long time'   Sexual activity: Yes    Birth control/protection: None  Other Topics Concern   Not on file  Social History Narrative   Not on file   Social Determinants of Health   Financial Resource Strain: Low Risk    Difficulty of Paying Living Expenses: Not hard at all  Food Insecurity: No Food Insecurity   Worried About Charity fundraiser in the  Last Year: Never true   Ran Out of Food in the Last Year: Never true  Transportation Needs: No Transportation Needs   Lack of Transportation (Medical): No   Lack of Transportation (Non-Medical): No  Physical Activity: Insufficiently Active   Days of Exercise per Week: 7 days   Minutes of Exercise per Session: 20 min  Stress: No Stress Concern Present   Feeling of Stress : Not at all  Social Connections: Moderately Isolated   Frequency of Communication with Friends and Family: More than three times a week   Frequency of Social Gatherings with Friends and Family: More than  three times a week   Attends Religious Services: Never   Marine scientist or Organizations: No   Attends Archivist Meetings: Never   Marital Status: Living with partner   Additional Social History:    Allergies:   Allergies  Allergen Reactions   Penicillins Other (See Comments)    Has patient had a PCN reaction causing immediate rash, facial/tongue/throat swelling, SOB or lightheadedness with hypotension: No Has patient had a PCN reaction causing severe rash involving mucus membranes or skin necrosis: No Has patient had a PCN reaction that required hospitalization No Has patient had a PCN reaction occurring within the last 10 years: No If all of the above answers are "NO", then may proceed with Cephalosporin use.     Labs:  Results for orders placed or performed during the hospital encounter of 11/28/20 (from the past 48 hour(s))  Glucose, capillary     Status: Abnormal   Collection Time: 12/01/20  4:52 PM  Result Value Ref Range   Glucose-Capillary 268 (H) 70 - 99 mg/dL    Comment: Glucose reference range applies only to samples taken after fasting for at least 8 hours.   Comment 1 Notify RN    Comment 2 Document in Chart   Glucose, capillary     Status: Abnormal   Collection Time: 12/01/20  9:54 PM  Result Value Ref Range   Glucose-Capillary 284 (H) 70 - 99 mg/dL    Comment: Glucose reference range applies only to samples taken after fasting for at least 8 hours.  Basic metabolic panel     Status: Abnormal   Collection Time: 12/02/20  4:55 AM  Result Value Ref Range   Sodium 131 (L) 135 - 145 mmol/L   Potassium 5.3 (H) 3.5 - 5.1 mmol/L   Chloride 104 98 - 111 mmol/L   CO2 21 (L) 22 - 32 mmol/L   Glucose, Bld 420 (H) 70 - 99 mg/dL    Comment: Glucose reference range applies only to samples taken after fasting for at least 8 hours.   BUN 36 (H) 8 - 23 mg/dL   Creatinine, Ser 1.78 (H) 0.61 - 1.24 mg/dL   Calcium 7.8 (L) 8.9 - 10.3 mg/dL   GFR, Estimated  41 (L) >60 mL/min    Comment: (NOTE) Calculated using the CKD-EPI Creatinine Equation (2021)    Anion gap 6 5 - 15    Comment: Performed at [facility], [address] [place], [place] [address]  CBC     Status: Abnormal   Collection Time: 12/02/20  4:55 AM  Result Value Ref Range   WBC 2.3 (L) 4.0 - 10.5 K/uL   RBC 3.34 (L) 4.22 - 5.81 MIL/uL   Hemoglobin 10.3 (L) 13.0 - 17.0 g/dL   HCT 30.8 (L) 39.0 - 52.0 %   MCV 92.2 80.0 - 100.0 fL  MCH 30.8 26.0 - 34.0 pg   MCHC 33.4 30.0 - 36.0 g/dL   RDW 13.9 11.5 - 15.5 %   Platelets 112 (L) 150 - 400 K/uL    Comment: Immature Platelet Fraction may be clinically indicated, consider ordering this additional test [id number]    nRBC 0.0 0.0 - 0.2 %    Comment: Performed at [facility], [address] [place], [place] [address]  Magnesium     Status: Abnormal   Collection Time: 12/02/20  4:55 AM  Result Value Ref Range   Magnesium 1.6 (L) 1.7 - 2.4 mg/dL    Comment: Performed at [facility], [address], [place], [place] [address]  Phosphorus     Status: None   Collection Time: 12/02/20  4:55 AM  Result Value Ref Range   Phosphorus 2.7 2.5 - 4.6 mg/dL    Comment: Performed at [facility], [address] [place], [place] [address]  Glucose, capillary     Status: Abnormal   Collection Time: 12/02/20  7:24 AM  Result Value Ref Range   Glucose-Capillary 385 (H) 70 - 99 mg/dL    Comment: Glucose reference range applies only to samples taken after fasting for at least 8 hours.  Glucose, capillary     Status: Abnormal   Collection Time: 12/02/20 11:37 AM  Result Value Ref Range   Glucose-Capillary 351 (H) 70 - 99 mg/dL    Comment: Glucose reference range applies only to samples taken after fasting for at least 8 hours.  Glucose, capillary     Status: Abnormal   Collection Time: 12/02/20  4:32 PM  Result Value Ref Range   Glucose-Capillary 210 (H) 70 - 99 mg/dL    Comment: Glucose  reference range applies only to samples taken after fasting for at least 8 hours.   Comment 1 Notify RN    Comment 2 Document in Chart   Glucose, capillary     Status: Abnormal   Collection Time: 12/02/20  8:35 PM  Result Value Ref Range   Glucose-Capillary 49 (L) 70 - 99 mg/dL    Comment: Glucose reference range applies only to samples taken after fasting for at least 8 hours.  Glucose, capillary     Status: Abnormal   Collection Time: 12/02/20  8:37 PM  Result Value Ref Range   Glucose-Capillary 44 (LL) 70 - 99 mg/dL    Comment: Glucose reference range applies only to samples taken after fasting for at least 8 hours.  Glucose, capillary     Status: Abnormal   Collection Time: 12/02/20  9:39 PM  Result Value Ref Range   Glucose-Capillary 163 (H) 70 - 99 mg/dL    Comment: Glucose reference range applies only to samples taken after fasting for at least 8 hours.   Comment 1 Notify RN   Basic metabolic panel     Status: Abnormal   Collection Time: 12/03/20  6:21 AM  Result Value Ref Range   Sodium 137 135 - 145 mmol/L   Potassium 4.2 3.5 - 5.1 mmol/L   Chloride 108 98 - 111 mmol/L   CO2 23 22 - 32 mmol/L   Glucose, Bld 161 (H) 70 - 99 mg/dL    Comment: Glucose reference range applies only to samples taken after fasting for at least 8 hours.   BUN 35 (H) 8 - 23 mg/dL   Creatinine, Ser 1.42 (H) 0.61 - 1.24 mg/dL   Calcium 8.1 (L) 8.9 - 10.3 mg/dL  GFR, Estimated 54 (L) >60 mL/min    Comment: (NOTE) Calculated using the CKD-EPI Creatinine Equation (2021)    Anion gap 6 5 - 15    Comment: Performed at [facility], [address] [place], [place] [address]  CBC     Status: Abnormal   Collection Time: 12/03/20  6:21 AM  Result Value Ref Range   WBC 1.9 (L) 4.0 - 10.5 K/uL   RBC 3.16 (L) 4.22 - 5.81 MIL/uL   Hemoglobin 9.7 (L) 13.0 - 17.0 g/dL   HCT 28.2 (L) 39.0 - 52.0 %   MCV 89.2 80.0 - 100.0 fL   MCH 30.7 26.0 - 34.0 pg   MCHC 34.4 30.0 - 36.0 g/dL   RDW 13.8  11.5 - 15.5 %   Platelets 110 (L) 150 - 400 K/uL    Comment: Immature Platelet Fraction may be clinically indicated, consider ordering this additional test [id number]    nRBC 0.0 0.0 - 0.2 %    Comment: Performed at [facility], [address] [place], [place] [address]  Ferritin     Status: None   Collection Time: 12/03/20  6:21 AM  Result Value Ref Range   Ferritin 212 24 - 336 ng/mL    Comment: Performed at [facility], [address], [place], [place] [address]  Magnesium     Status: None   Collection Time: 12/03/20  6:21 AM  Result Value Ref Range   Magnesium 1.9 1.7 - 2.4 mg/dL    Comment: Performed at [facility], [address] [place], [place] [address]  Glucose, capillary     Status: Abnormal   Collection Time: 12/03/20  7:23 AM  Result Value Ref Range   Glucose-Capillary 169 (H) 70 - 99 mg/dL    Comment: Glucose reference range applies only to samples taken after fasting for at least 8 hours.  Glucose, capillary     Status: Abnormal   Collection Time: 12/03/20 12:30 PM  Result Value Ref Range   Glucose-Capillary 191 (H) 70 - 99 mg/dL    Comment: Glucose reference range applies only to samples taken after fasting for at least 8 hours.    Current Facility-Administered Medications  Medication Dose Route Frequency Provider Last Rate Last Admin   0.9 %  sodium chloride infusion   Intravenous Continuous Sharion Settler, NP 75 mL/hr at 12/03/20 0503 Infusion Verify at 12/03/20 0503   acetaminophen (TYLENOL) tablet 650 mg  650 mg Oral Q6H PRN Imagene Sheller S, DO       Or   acetaminophen (TYLENOL) suppository 650 mg  650 mg Rectal Q6H PRN Imagene Sheller S, DO       amLODipine (NORVASC) tablet 10 mg  10 mg Oral Daily Val Riles, MD   10 mg at 12/03/20 7782   aspirin chewable tablet 81 mg  81 mg Oral Daily Val Riles, MD   81 mg at 12/03/20 0912   cloNIDine (CATAPRES - Dosed in mg/24 hr) patch 0.2 mg  0.2 mg Transdermal Weekly Val Riles, MD   0.2 mg at 11/29/20 1332   gabapentin (NEURONTIN) capsule 200 mg  200 mg Oral TID Patrecia Pour, NP   200 mg at 12/03/20 0912   heparin injection 5,000 Units  5,000 Units Subcutaneous Q8H Anwar, Bonnee Quin S, DO   5,000 Units at 12/03/20 1234   hydrALAZINE (APRESOLINE) injection 10 mg  10 mg Intravenous Q6H PRN Val Riles, MD   10 mg at 11/29/20 1109  HYDROcodone-acetaminophen (NORCO/VICODIN) 5-325 MG per tablet 1-2 tablet  1-2 tablet Oral Q4H PRN Imagene Sheller S, DO       insulin aspart (novoLOG) injection 5 Units  5 Units Subcutaneous TID WC Mariel Aloe, MD   5 Units at 12/03/20 1234   insulin glargine-yfgn (SEMGLEE) injection 15 Units  15 Units Subcutaneous Daily Mariel Aloe, MD   15 Units at 12/03/20 0911   metoprolol tartrate (LOPRESSOR) injection 5 mg  5 mg Intravenous Q6H PRN Val Riles, MD   5 mg at 11/29/20 1404   morphine 2 MG/ML injection 2 mg  2 mg Intravenous Q2H PRN Imagene Sheller S, DO       ondansetron (ZOFRAN) tablet 4 mg  4 mg Oral Q6H PRN Imagene Sheller S, DO       Or   ondansetron (ZOFRAN) injection 4 mg  4 mg Intravenous Q6H PRN Anwar, Shayan S, DO       polyethylene glycol (MIRALAX / GLYCOLAX) packet 17 g  17 g Oral Daily PRN Leslee Home, DO        Musculoskeletal: Strength & Muscle Tone: within normal limits Gait & Station: normal Patient leans: N/A            Psychiatric Specialty Exam:  Presentation  General Appearance:  No data recorded Eye Contact: No data recorded Speech: No data recorded Speech Volume: No data recorded Handedness: No data recorded  Mood and Affect  Mood: No data recorded Affect: No data recorded  Thought Process  Thought Processes: No data recorded Descriptions of Associations:No data recorded Orientation:No data recorded Thought Content:No data recorded History of Schizophrenia/Schizoaffective disorder:No data recorded Duration of Psychotic Symptoms:No data recorded Hallucinations:No  data recorded Ideas of Reference:No data recorded Suicidal Thoughts:No data recorded Homicidal Thoughts:No data recorded  Sensorium  Memory: No data recorded Judgment: No data recorded Insight: No data recorded  Executive Functions  Concentration: No data recorded Attention Span: No data recorded Recall: No data recorded Fund of Knowledge: No data recorded Language: No data recorded  Psychomotor Activity  Psychomotor Activity: No data recorded  Assets  Assets: No data recorded  Sleep  Sleep: No data recorded  Physical Exam: Physical Exam Vitals and nursing note reviewed.  Constitutional:      Appearance: Normal appearance.  HENT:     Head: Normocephalic and atraumatic.     Mouth/Throat:     Pharynx: Oropharynx is clear.  Eyes:     Pupils: Pupils are equal, round, and reactive to light.  Cardiovascular:     Rate and Rhythm: Normal rate and regular rhythm.  Pulmonary:     Effort: Pulmonary effort is normal.     Breath sounds: Normal breath sounds.  Abdominal:     General: Abdomen is flat.     Palpations: Abdomen is soft.  Musculoskeletal:        General: Normal range of motion.  Skin:    General: Skin is warm and dry.  Neurological:     General: No focal deficit present.     Mental Status: He is alert. Mental status is at baseline.  Psychiatric:        Mood and Affect: Mood normal.        Thought Content: Thought content normal.   Review of Systems  Constitutional: Negative.   HENT: Negative.    Eyes: Negative.   Respiratory: Negative.    Cardiovascular: Negative.   Gastrointestinal: Negative.   Musculoskeletal: Negative.   Skin: Negative.   Neurological:  Negative.   Psychiatric/Behavioral: Negative.    Blood pressure (!) 158/94, pulse 73, temperature 98.1 F (36.7 C), temperature source Oral, resp. rate 16, height 5' 10.5" (1.791 m), weight 79 kg, SpO2 100 %. Body mass index is 24.64 kg/m.  Treatment Plan Summary: Plan discontinue  Haldol.  No current indication for it.  Patient informed of the reason it had been used in of the discharge of the medicine now.  No further outpatient treatment at discharge.  Disposition: No evidence of imminent risk to self or others at present.   Patient does not meet criteria for psychiatric inpatient admission. Supportive therapy provided about ongoing stressors.  Alethia Berthold, MD 12/03/2020 4:19 PM

## 2020-12-03 NOTE — Progress Notes (Signed)
PROGRESS NOTE    [name]  XMI:[id number] DOB: 08-23-1950 DOA: 11/28/2020 PCP: Birdie Sons, MD   Brief Narrative: Nicholas Weiss is a 70 y.o. male with a history of type 2 diabetes mellitus, hypertension, hyperlipidemia, multiple myeloma, current leukopenia, cocaine abuse.  Patient presented secondary to hypoglycemia and involuntary movements.  He was initially taken off insulin and given D5 IV fluids with improvement of his hyperglycemia.  While admitted, he developed delirium/agitation requiring EEG without evidence of seizure activity.  Now improved.   Assessment & Plan:   Active Problems:   Type 1 diabetes mellitus with diabetic neuropathy, unspecified (HCC)   AKI (acute kidney injury) ([place])   CKD (chronic kidney disease) stage 3, GFR 30-59 ml/min (HCC)   Hypoglycemia   Cocaine abuse ([place])   Acute metabolic encephalopathy   Metabolic encephalopathy Agitation Delirium Patient was managed with Haldol IV as needed.  Psychiatry was consulted with recommendations for haldol 2 mg BID scheduled.  Patient received empiric treatment with thiamine IV 500 milligrams daily x3 days. Unsure etiology but resolved. -Continue Haldol  2 mg BID but will need to confirm discharge medication with psychiatry  AKI on CKD IIIb Difficult to assess baseline but appears to be around 1.6. Creatinine of 2.29 on admission. Resolved with IV fluids  Hypoglycemia Unsure of etiology. Patient required 1 amp of D50 in the ED and was placed on D5 water followed by D10 water and insulin was discontinued. Resolved.  Fall Occurred while patient was confused. CT head unremarkable for acute injury.  Chronic anemia Normocytic.  Recent poorly prepped colonoscopy (2021) with evidence of nonbleeding hemorrhoids.  Multiple gastric ulcers seen on recent upper endoscopy (2021).  Hemoglobin is currently stable no evidence of bleeding.  Iron panel does not appear to suggest iron deficiency anemia. Ferritin  normal  Diabetes mellitus, type 2 Patient is on Tresiba 20 units daily as an outpatient. On further chart review, in addition to continued discussion with patient, it appears patient is not a type 1 diabetic patient. Recurrent hypoglycemia on 9/5 but this may have been secondary to receiving insulin without a meal. Patient may need more simplified regimen on discharge without sliding scale. -Continue Semglee 15 units daily and Novology 5 units TID with meals -Discontinue SSI  Primary hypertension Patient is on lisinopril and amlodipine as an outpatient.  Hyperlipidemia Patient is on lovastatin as outpatient.  Diabetic neuropathy Pregablin held presumably secondary to altered mental status/AKI vs myoclonic jerking in setting of AKI. Started on gabapentin 200 mg TID. -Continue gabapentin for now but can likely resume home pregablin on discharge.  Hypomagnesemia Given supplementation -Follow-up magnesium  Chronic leukopenia Neutropenia Afebrile. Appears to be new from this year. Pathology smear review (9/4) is pending. [place].  Polysubstance abuse UDS positive for cocaine and THC. Counseled this admission.  Myoclonic jerking EEG without evidence of seizure activity.    DVT prophylaxis: Subcutaneous heparin Code Status:   Code Status: Full Code Family Communication: None at bedside Disposition Plan: Discharge home likely in 24 hours if no significant hypoglycemic episodes, psychiatry recommendations for outpatient medications   Consultants:  Psychiatry  Procedures:  EEG (11/29/2020) Clinical Interpretation: This EEG is consistent with a mild generalized non-specific cerebral dysfunction(encephalopathy). There was no seizure or seizure predisposition recorded on this study. Please note that lack of epileptiform activity on EEG does not preclude the possibility of epilepsy.   Antimicrobials: None   Subjective: No issues this morning. Hypoglycemia last night. Patient reports  not eating  much yesterday prior to eating a burger from [employer].  Objective: Vitals:   12/02/20 0803 12/02/20 1524 12/02/20 2046 12/03/20 0422  BP: 127/81 112/62 137/76 (!) 154/88  Pulse: 83 83 76 68  Resp: 18 18 16 16   Temp: 98.7 F (37.1 C) 98.6 F (37 C) 98.6 F (37 C) 98 F (36.7 C)  TempSrc: Oral     SpO2: 100% 99% 100% 100%  Weight:    79 kg  Height:        Intake/Output Summary (Last 24 hours) at 12/03/2020 0854 Last data filed at 12/03/2020 3009 Gross per 24 hour  Intake 2570.84 ml  Output 825 ml  Net 1745.84 ml    Filed Weights   12/01/20 0441 12/02/20 0500 12/03/20 0422  Weight: 77 kg 77.3 kg 79 kg    Examination:  General exam: Appears calm and comfortable Respiratory system: Clear to auscultation. Respiratory effort normal. Cardiovascular system: S1 & S2 heard, RRR. No murmurs, rubs, gallops or clicks. Gastrointestinal system: Abdomen is nondistended, soft and nontender. No organomegaly or masses felt. Normal bowel sounds heard. Central nervous system: Alert and oriented. No focal neurological deficits. Musculoskeletal: No edema. No calf tenderness Skin: No cyanosis. No rashes Psychiatry: Judgement and insight appear normal. Mood & affect appropriate.     Data Reviewed: I have personally reviewed following labs and imaging studies  CBC Lab Results  Component Value Date   WBC 1.9 (L) 12/03/2020   RBC 3.16 (L) 12/03/2020   HGB 9.7 (L) 12/03/2020   HCT 28.2 (L) 12/03/2020   MCV 89.2 12/03/2020   MCH 30.7 12/03/2020   PLT 110 (L) 12/03/2020   MCHC 34.4 12/03/2020   RDW 13.8 12/03/2020   LYMPHSABS 0.7 12/01/2020   MONOABS 0.2 12/01/2020   EOSABS 0.0 12/01/2020   BASOSABS 0.0 23/30/0762     Last metabolic panel Lab Results  Component Value Date   NA 137 12/03/2020   K 4.2 12/03/2020   CL 108 12/03/2020   CO2 23 12/03/2020   BUN 35 (H) 12/03/2020   CREATININE 1.42 (H) 12/03/2020   GLUCOSE 161 (H) 12/03/2020   GFRNONAA 54 (L) 12/03/2020    GFRAA 54 (L) 03/15/2020   CALCIUM 8.1 (L) 12/03/2020   PHOS 2.7 12/02/2020   PROT 9.1 (H) 10/03/2020   ALBUMIN 3.3 (L) 10/03/2020   LABGLOB 5.8 (H) 10/03/2020   AGRATIO 0.6 (L) 10/03/2020   BILITOT 0.4 10/03/2020   ALKPHOS 55 10/03/2020   AST 13 10/03/2020   ALT 14 10/03/2020   ANIONGAP 6 12/03/2020    CBG (last 3)  Recent Labs    12/02/20 2037 12/02/20 2139 12/03/20 0723  GLUCAP 44* 163* 169*      GFR: Estimated Creatinine Clearance: 53 mL/min (A) (by C-G formula based on SCr of 1.42 mg/dL (H)).  Coagulation Profile: No results for input(s): INR, PROTIME in the last 168 hours.  Recent Results (from the past 240 hour(s))  Resp Panel by RT-PCR (Flu A&B, Covid)     Status: None   Collection Time: 11/29/20  1:39 AM  Result Value Ref Range Status   SARS Coronavirus 2 by RT PCR NEGATIVE NEGATIVE Final    Comment: (NOTE) SARS-CoV-2 target nucleic acids are NOT DETECTED.  The SARS-CoV-2 RNA is generally detectable in upper respiratory specimens during the acute phase of infection. The lowest concentration of SARS-CoV-2 viral copies this assay can detect is 138 copies/mL. A negative result does not preclude SARS-Cov-2 infection and should not be used as the sole  basis for treatment or other patient management decisions. A negative result may occur with  improper specimen collection/handling, submission of specimen other than nasopharyngeal swab, presence of viral mutation(s) within the areas targeted by this assay, and inadequate number of viral copies(<138 copies/mL). A negative result must be combined with clinical observations, patient history, and epidemiological information. The expected result is Negative.  Fact Sheet for Patients:  EntrepreneurPulse.com.au  Fact Sheet for Healthcare Providers:  IncredibleEmployment.be  This test is no t yet approved or cleared by the [place] FDA and  has been authorized for detection  and/or diagnosis of SARS-CoV-2 by FDA under an Emergency Use Authorization (EUA). This EUA will remain  in effect (meaning this test can be used) for the duration of the COVID-19 declaration under Section 564(b)(1) of the Act, 21 U.S.C.section 360bbb-3(b)(1), unless the authorization is terminated  or revoked sooner.       Influenza A by PCR NEGATIVE NEGATIVE Final   Influenza B by PCR NEGATIVE NEGATIVE Final    Comment: (NOTE) The Xpert Xpress SARS-CoV-2/FLU/RSV plus assay is intended as an aid in the diagnosis of influenza from Nasopharyngeal swab specimens and should not be used as a sole basis for treatment. Nasal washings and aspirates are unacceptable for Xpert Xpress SARS-CoV-2/FLU/RSV testing.  Fact Sheet for Patients: EntrepreneurPulse.com.au  Fact Sheet for Healthcare Providers: IncredibleEmployment.be  This test is not yet approved or cleared by the [place] FDA and has been authorized for detection and/or diagnosis of SARS-CoV-2 by FDA under an Emergency Use Authorization (EUA). This EUA will remain in effect (meaning this test can be used) for the duration of the COVID-19 declaration under Section 564(b)(1) of the Act, 21 U.S.C. section 360bbb-3(b)(1), unless the authorization is terminated or revoked.  Performed at [facility], [address], [place], [place] [address]          Radiology Studies: US RENAL  Result Date: 12/01/2020 CLINICAL DATA:  Acute kidney injury. EXAM: RENAL / URINARY TRACT ULTRASOUND COMPLETE COMPARISON:  Renal ultrasound 08/02/2020 FINDINGS: Right Kidney: Renal measurements: 9.5 x 4.7 x 4.6 cm = volume: 108 mL. Borderline increased parenchymal echogenicity. No hydronephrosis. No visualized stone or focal lesion. Left Kidney: Renal measurements: 8.3 x 4.6 x 4.0 cm = volume: 81 mL. Limited visualization due to overlying bowel gas and rib shadowing. No hydronephrosis. More detailed  assessment is limited. Bladder: Appears normal for degree of bladder distention. Other: None. IMPRESSION: 1. No obstructive uropathy. 2. Limited evaluation of the left kidney due to overlying bowel gas and rib shadowing. 3. Suggestion of mild increased right renal parenchymal echogenicity suggesting chronic medical renal disease. Electronically Signed   By: Keith Rake M.D.   On: 12/01/2020 18:30        Scheduled Meds:  amLODipine  10 mg Oral Daily   aspirin  81 mg Oral Daily   cloNIDine  0.2 mg Transdermal Weekly   gabapentin  200 mg Oral TID   haloperidol  2 mg Oral BID   heparin  5,000 Units Subcutaneous Q8H   insulin aspart  5 Units Subcutaneous TID WC   insulin glargine-yfgn  15 Units Subcutaneous Daily   Continuous Infusions:  sodium chloride 75 mL/hr at 12/03/20 0503     LOS: 4 days     Cordelia Poche, MD [facility] 12/03/2020, 8:54 AM  If 7PM-7AM, please contact night-coverage [email or web address]

## 2020-12-03 NOTE — Progress Notes (Signed)
Occupational Therapy Treatment Patient Details Name: Nicholas Weiss MRN: [id number] DOB: 1951/03/09 Today's Date: 12/03/2020    History of present illness Nicholas Weiss is a 6yoM who comes to [facility] on 11/28/20 c hypoglemic episode and jerking. PMH: DM1, HTN, HLD, multiple myeloma, coacaine abuse. EEG and MRI in june for similar was unrevealing. Pt sustained a fall OOB to floor in prone on 9/2.   OT comments  Nicholas Weiss performed well today: A&O x 4, pleasant and conversational, demonstrating a clear understanding of the events that had landed him in the hospital. We discussed signs and symptoms of hypoglycemia and required responses. He verbalized understanding, stated that his partner and son, with whom he lives, were also aware of this. Pt engaged in bed mobility, transfers, dressing, toileting, all with Mod I and w/o AD. Pt reports feeling at his baseline level of fxl mobility and eager to return home. No further OT needed at this time.    Follow Up Recommendations  No OT follow up;Supervision - Intermittent    Equipment Recommendations  None recommended by OT    Recommendations for Other Services      Precautions / Restrictions Precautions Precautions: Fall Restrictions Weight Bearing Restrictions: No       Mobility Bed Mobility Overal bed mobility: Modified Independent             General bed mobility comments: increased time/effort    Transfers Overall transfer level: Modified independent Equipment used: None             General transfer comment: increased time/effort    Balance Overall balance assessment: Modified Independent   Sitting balance-Leahy Scale: Good Sitting balance - Comments: No balance concerns reaching outside BOS   Standing balance support: During functional activity;No upper extremity supported Standing balance-Leahy Scale: Good                             ADL either performed or assessed with clinical judgement   ADL  Overall ADL's : At baseline;Modified independent                                             Vision       Perception     Praxis      Cognition Arousal/Alertness: Awake/alert Behavior During Therapy: WFL for tasks assessed/performed Overall Cognitive Status: Within Functional Limits for tasks assessed                                 General Comments: A&O x 4        Exercises Other Exercises Other Exercises: Bed mobility, transfers, toileting, dressing. Discussion re: DM/medication mgmt   Shoulder Instructions       General Comments Multiple skin tears on left upper arm, appears to be from tape    Pertinent Vitals/ Pain       Pain Assessment: No/denies pain  Home Living                                          Prior Functioning/Environment              Frequency   (no additional hospital-based OT  needed)        Progress Toward Goals  OT Goals(current goals can now be found in the care plan section)  Progress towards OT goals: Progressing toward goals  Acute Rehab OT Goals Patient Stated Goal: to go home OT Goal Formulation: With patient Time For Goal Achievement: 12/15/20 Potential to Achieve Goals: Good  Plan Discharge plan remains appropriate;Frequency needs to be updated    Co-evaluation                 AM-PAC OT "6 Clicks" Daily Activity     Outcome Measure   Help from another person eating meals?: None Help from another person taking care of personal grooming?: None Help from another person toileting, which includes using toliet, bedpan, or urinal?: None Help from another person bathing (including washing, rinsing, drying)?: None Help from another person to put on and taking off regular upper body clothing?: None Help from another person to put on and taking off regular lower body clothing?: None 6 Click Score: 24    End of Session    OT Visit Diagnosis: Unsteadiness on feet  (R26.81);History of falling (Z91.81)   Activity Tolerance Patient tolerated treatment well   Patient Left in bed;with call bell/phone within reach;with bed alarm set;with nursing/sitter in room   Nurse Communication          Time: 7517-0017 OT Time Calculation (min): 12 min  Charges: OT General Charges $OT Visit: 1 Visit OT Treatments $Self Care/Home Management : 8-22 mins  Josiah Lobo, PhD, MS, OTR/L 12/03/20, 10:44 AM

## 2020-12-03 NOTE — TOC Progression Note (Addendum)
Transition of Care [facility]) - Progression Note    Patient Details  Name: Nicholas Weiss MRN: [id number] Date of Birth: 04/14/1950  Transition of Care [facility]) CM/SW [place], LCSW Phone Number: 12/03/2020, 9:36 AM  Clinical Narrative:  Left message for [facility] representative to see if they could add a nurse to services for diabetes management.   12:12 pm: Alvis Lemmings is able to add a nurse.  Expected Discharge Plan: Home/Self Care Barriers to Discharge: Continued Medical Work up  Expected Discharge Plan and Services Expected Discharge Plan: Home/Self Care       Living arrangements for the past 2 months: Single Family Home                                       Social Determinants of Health (SDOH) Interventions    Readmission Risk Interventions Readmission Risk Prevention Plan 11/30/2020  Transportation Screening Complete  Medication Review Press photographer) Complete  PCP or Specialist appointment within 3-5 days of discharge Complete  HRI or Home Care Consult Complete  SW Recovery Care/Counseling Consult Complete  [place] Complete  Some recent data might be hidden

## 2020-12-03 NOTE — Progress Notes (Signed)
Physical Therapy Treatment Patient Details Name: Nicholas Weiss MRN: [id number] DOB: February 06, 1951 Today's Date: 12/03/2020    History of Present Illness Nicholas Weiss is a 33yoM who comes to [facility] on 11/28/20 c hypoglemic episode and jerking. PMH: DM1, HTN, HLD, multiple myeloma, coacaine abuse. EEG and MRI in june for similar was unrevealing. Pt sustained a fall OOB to floor in prone on 9/2.    PT Comments    Pt received supine in bed stating he needed to use the restroom. PT provided CGA during ambulatory transfer and standing toileting/hygiene for safety and assist with stability due to increased trunk sway. Pt then agreeable to walking in the hall and performing additional functional exercises. Pt increased ambulation distance to 3105f w/o AD. CGA provided for safety and mild stability. Narrow BOS with occasional scissoring - pt states scissoring is on purpose. PT asked pt to refrain and scissoring was no longer observed (although BOS did remain narrow). Pt then performed x10 STS without UE support. Pt is improving however requires further stability training, especially as he states he does not want to use an AD. Would benefit from skilled PT to address above deficits and promote optimal return to PLOF.   Follow Up Recommendations  Home health PT;Supervision/Assistance - 24 hour     Equipment Recommendations  Rolling walker with 5" wheels    Recommendations for Other Services       Precautions / Restrictions Precautions Precautions: Fall Restrictions Weight Bearing Restrictions: No    Mobility  Bed Mobility Overal bed mobility: Modified Independent             General bed mobility comments: increased time/effort    Transfers Overall transfer level: Modified independent Equipment used: None Transfers: Sit to/from Stand           General transfer comment: increased effort - 10 reps for functional strength and endurance  Ambulation/Gait Ambulation/Gait assistance:  Min guard Gait Distance (Feet): 360 Feet Assistive device: None Gait Pattern/deviations: Step-through pattern;Scissoring;Narrow base of support Gait velocity: decreased   General Gait Details: CGA for safety and mild stability to ambulate w/o AD. Narrow BOS with occasional scissoring - pt states scissoring is on purpose. PT asked pt to refrain and scissoring was no longer observed (although BOS did remain narrow)   Stairs             Wheelchair Mobility    Modified Rankin (Stroke Patients Only)       Balance Overall balance assessment: Modified Independent   Sitting balance-Leahy Scale: Good Sitting balance - Comments: No balance concerns   Standing balance support: During functional activity;No upper extremity supported Standing balance-Leahy Scale: Good Standing balance comment: CGA during standing toileting and hand washing as well as ambulation - all w/o AD.                            Cognition Arousal/Alertness: Awake/alert Behavior During Therapy: WFL for tasks assessed/performed Overall Cognitive Status: Within Functional Limits for tasks assessed                                 General Comments: pleasant and agreeable      Exercises Other Exercises Other Exercises: Performed x10 STS with no UE support from fully lowered EOB. Slight muscular fatigue with decrease in power and speed of movement in final 4 reps. Toileting also performed at beginning of session -  pt stood to toilet and wash hands, CGA for safety due to trunk sway with no LOB noted.    General Comments        Pertinent Vitals/Pain Pain Assessment: No/denies pain    Home Living                      Prior Function            PT Goals (current goals can now be found in the care plan section) Acute Rehab PT Goals Patient Stated Goal: to go home PT Goal Formulation: With patient Time For Goal Achievement: 12/15/20 Potential to Achieve Goals: Good     Frequency    Min 2X/week      PT Plan      Co-evaluation              AM-PAC PT "6 Clicks" Mobility   Outcome Measure  Help needed turning from your back to your side while in a flat bed without using bedrails?: None Help needed moving from lying on your back to sitting on the side of a flat bed without using bedrails?: None Help needed moving to and from a bed to a chair (including a wheelchair)?: None Help needed standing up from a chair using your arms (e.g., wheelchair or bedside chair)?: None Help needed to walk in hospital room?: A Little Help needed climbing 3-5 steps with a railing? : A Little 6 Click Score: 22    End of Session Equipment Utilized During Treatment: Gait belt Activity Tolerance: Patient tolerated treatment well Patient left: in bed;with call bell/phone within reach;with bed alarm set Nurse Communication: Mobility status (amb with nursing with RW) PT Visit Diagnosis: Unsteadiness on feet (R26.81);History of falling (Z91.81);Difficulty in walking, not elsewhere classified (R26.2)     Time: 9311-2162 PT Time Calculation (min) (ACUTE ONLY): 11 min  Charges:  $Therapeutic Activity: 8-22 mins                    Patrina Levering PT, DPT 12/03/20 3:12 PM [phone]    [facility] 12/03/2020, 3:06 PM

## 2020-12-04 DIAGNOSIS — N179 Acute kidney failure, unspecified: Secondary | ICD-10-CM | POA: Diagnosis not present

## 2020-12-04 DIAGNOSIS — N1832 Chronic kidney disease, stage 3b: Secondary | ICD-10-CM | POA: Diagnosis not present

## 2020-12-04 DIAGNOSIS — F1410 Cocaine abuse, uncomplicated: Secondary | ICD-10-CM | POA: Diagnosis not present

## 2020-12-04 DIAGNOSIS — G9341 Metabolic encephalopathy: Secondary | ICD-10-CM | POA: Diagnosis not present

## 2020-12-04 LAB — GLUCOSE, CAPILLARY: Glucose-Capillary: 272 mg/dL — ABNORMAL HIGH (ref 70–99)

## 2020-12-04 LAB — BASIC METABOLIC PANEL
Anion gap: 6 (ref 5–15)
BUN: 22 mg/dL (ref 8–23)
CO2: 24 mmol/L (ref 22–32)
Calcium: 8 mg/dL — ABNORMAL LOW (ref 8.9–10.3)
Chloride: 103 mmol/L (ref 98–111)
Creatinine, Ser: 1.16 mg/dL (ref 0.61–1.24)
GFR, Estimated: >60 mL/min (ref >60)
Glucose, Bld: 245 mg/dL — ABNORMAL HIGH (ref 70–99)
Potassium: 4.1 mmol/L (ref 3.5–5.1)
Sodium: 133 mmol/L — ABNORMAL LOW (ref 135–145)

## 2020-12-04 LAB — CBC
HCT: 29.8 % — ABNORMAL LOW (ref 39.0–52.0)
Hemoglobin: 10 g/dL — ABNORMAL LOW (ref 13.0–17.0)
MCH: 29.7 pg (ref 26.0–34.0)
MCHC: 33.6 g/dL (ref 30.0–36.0)
MCV: 88.4 fL (ref 80.0–100.0)
Platelets: 107 10*3/uL — ABNORMAL LOW (ref 150–400)
RBC: 3.37 MIL/uL — ABNORMAL LOW (ref 4.22–5.81)
RDW: 13.5 % (ref 11.5–15.5)
WBC: 1.9 10*3/uL — ABNORMAL LOW (ref 4.0–10.5)
nRBC: 0 % (ref 0.0–0.2)

## 2020-12-04 MED ORDER — AMLODIPINE BESYLATE 10 MG PO TABS
10.0000 mg | ORAL_TABLET | Freq: Every day | ORAL | 0 refills | Status: DC
Start: 2020-12-04 — End: 2021-08-11

## 2020-12-04 MED ORDER — TRESIBA FLEXTOUCH 100 UNIT/ML ~~LOC~~ SOPN: PEN_INJECTOR | SUBCUTANEOUS | 3 refills | Status: DC

## 2020-12-04 NOTE — TOC Transition Note (Signed)
Transition of Care [facility]) - CM/SW Discharge Note   Patient Details  Name: Nicholas Weiss MRN: [id number] Date of Birth: 1950-05-26  Transition of Care [facility]) CM/SW Contact:  Candie Chroman, LCSW Phone Number: 12/04/2020, 10:17 AM   Clinical Narrative:  Patient has orders to discharge home today. Left message for [facility] representative to notify. No further concerns. CSW signing off.   Final next level of care: [place] Barriers to Discharge: Barriers Resolved   Patient Goals and CMS Choice Patient states their goals for this hospitalization and ongoing recovery are:: to return home with [facility].gov Compare Post Acute Care list provided to:: Patient Represenative (must comment)    Discharge Placement                    Patient and family notified of of transfer: 12/04/20  Discharge Plan and Services                          HH Arranged: RN, PT [facility] Agency: [place] Date [facility] Agency Contacted: 12/04/20   Representative spoke with at [place]: Adela Lank  Social Determinants of Health ([place]) Interventions     Readmission Risk Interventions Readmission Risk Prevention Plan 11/30/2020  Transportation Screening Complete  Medication Review Press photographer) Complete  PCP or Specialist appointment within 3-5 days of discharge Complete  HRI or Home Care Consult Complete  SW Recovery Care/Counseling Consult Complete  Palliative Care Screening Not [place] Complete  Some recent data might be hidden

## 2020-12-04 NOTE — Discharge Summary (Signed)
Physician Discharge Summary  Nicholas Weiss ZWC:[id number] DOB: [birth year] DOA: 11/28/2020  PCP: Birdie Sons, MD  Admit date: 11/28/2020 Discharge date: 12/04/2020  Admitted From: Home Disposition: Home  Recommendations for Outpatient Follow-up:  Follow up with PCP in 1 week with repeat CBC/BMP Follow up in ED if symptoms worsen or new appear   Home Health: PT/RN Equipment/Devices: None  Discharge Condition: Stable CODE STATUS: Full Diet recommendation: Heart healthy/carb modified  Brief/Interim Summary: 70 y.o. male with a history of type 2 diabetes mellitus, hypertension, hyperlipidemia, multiple myeloma, current leukopenia, cocaine abuse presented with hypoglycemia and involuntary movements.  He was initially taken off insulin and given D5 IV fluids with improvement of his hypoglycemia.  He also developed delirium/agitation; EEG was negative for seizure activity.  He was treated with Haldol as needed.  Psychiatry evaluated the patient and discontinued Haldol and cleared the patient for discharge.  His mental status is much improved and kidney function has improved back to baseline.  He will be discharged home today with home with PT/RN.  Discharge Diagnoses:    Acute metabolic encephalopathy Agitation/delirium -Empirically treated with thiamine 500 mg daily for 3 days. -Also required initial IV Haldol which was changed to Haldol 2 mg twice daily by psychiatry.  Psychiatry reevaluated the patient on 12/03/2020 and DC'd Haldol with no need for any other medications.  Psychiatry has cleared the patient for discharge.   -Mental status is much improved and is probably back to baseline.  Will need home health PT -CT of the head was negative for acute intracranial abnormity on presentation  AKI on CKD stage IIIb -Baseline creatinine around 1.6.  Creatinine 2.29 on admission. -Resolved with IV fluids.  Outpatient follow-up.  Hypoglycemia Diabetes mellitus type 2 -Required D50 in the  ED followed by D5 and D10.  Subsequently hypoglycemia has resolved.  Currently hyperglycemic.  Discharge patient on lower dose of insulin.  Carb modified diet.  Outpatient follow-up  Anemia of chronic disease -Possibly from kidney disease.  Hemoglobin stable.  Outpatient follow-up  Chronic leukopenia -Stable.  Outpatient follow-up  Thrombocytopenia -No signs of bleeding.  Outpatient follow-up  Primary hypertension -Blood pressure currently stable.  Resume home regimen.  Outpatient follow-up  Polysubstance abuse -UDS was positive for cocaine and THC.  He was counseled by prior hospitalist during this admission regarding cessation  Myoclonic jerking -EEG without evidence of seizure activity.  Resolved.  Hyperlipidemia -Resume lovastatin  Mild hyponatremia -Outpatient follow-up  Hypomagnesemia - improved    Discharge Instructions  Discharge Instructions     Diet - low sodium heart healthy   Complete by: As directed    Diet Carb Modified   Complete by: As directed    Increase activity slowly   Complete by: As directed       Allergies as of 12/04/2020       Reactions   Penicillins Other (See Comments)   Has patient had a PCN reaction causing immediate rash, facial/tongue/throat swelling, SOB or lightheadedness with hypotension: No Has patient had a PCN reaction causing severe rash involving mucus membranes or skin necrosis: No Has patient had a PCN reaction that required hospitalization No Has patient had a PCN reaction occurring within the last 10 years: No If all of the above answers are "NO", then may proceed with Cephalosporin use.        Medication List     TAKE these medications    amLODipine 10 MG tablet Commonly known as: NORVASC Take 1 tablet (10 mg  total) by mouth daily. What changed: how much to take   aspirin 81 MG chewable tablet Chew 1 tablet by mouth daily.   B-D SINGLE USE SWABS REGULAR Pads Use to check blood sugar four times daily for  type 1 diabetes   insulin aspart 100 UNIT/ML injection Commonly known as: novoLOG Inject up to 14 units three times daily before meals according to sliding scale   INSULIN SYRINGE 1CC/31GX5/16" 31G X 5/16" 1 ML Misc For insulin injections up to 4 times daily   lisinopril 2.5 MG tablet Commonly known as: ZESTRIL Take 2.5 mg by mouth daily.   lovastatin 40 MG tablet Commonly known as: MEVACOR Take 40 mg by mouth at bedtime.   pregabalin 75 MG capsule Commonly known as: LYRICA Take 1 capsule by mouth twice daily   Tresiba FlexTouch 100 UNIT/ML FlexTouch Pen Generic drug: insulin degludec 15 units daily. What changed: additional instructions   True Metrix Level 1 Low Soln Use to check blood sugar four times daily for type 1 diabetes               Durable Medical Equipment  (From admission, onward)           Start     Ordered   12/01/20 0910  For home use only DME Walker rolling  Once       Question Answer Comment  Walker: With [place] Wheels   Patient needs a walker to treat with the following condition Abnormality of gait and mobility      12/01/20 0910            Follow-up Information     [facility] Follow up.   Specialty: Home Health Services Why: They will follow up with you for your home health needs: Physical therapy and nursing. Contact information: [place] [address] [phone]         Birdie Sons, MD. Schedule an appointment as soon as possible for a visit in 1 week(s).   Specialty: Family Medicine Why: with repeat cbc/bmp Contact information: [address] [place] [address] [phone]                Allergies  Allergen Reactions   Penicillins Other (See Comments)    Has patient had a PCN reaction causing immediate rash, facial/tongue/throat swelling, SOB or lightheadedness with hypotension: No Has patient had a PCN reaction causing severe rash involving  mucus membranes or skin necrosis: No Has patient had a PCN reaction that required hospitalization No Has patient had a PCN reaction occurring within the last 10 years: No If all of the above answers are "NO", then may proceed with Cephalosporin use.     Consultations: Psychiatry   Procedures/Studies: CT HEAD WO CONTRAST (5MM)  Result Date: 11/29/2020 CLINICAL DATA:  Head trauma.  Hypoglycemia and involuntary movement. EXAM: CT HEAD WITHOUT CONTRAST TECHNIQUE: Contiguous axial images were obtained from the base of the skull through the vertex without intravenous contrast. COMPARISON:  Head CT 11/28/2020 and MRI 09/12/2020 FINDINGS: Brain: There is no evidence of an acute infarct, intracranial hemorrhage, mass, midline shift, or extra-axial fluid collection. The ventricles and sulci are normal. Hypodensities in the cerebral white matter are unchanged and nonspecific but compatible with mild chronic small vessel ischemic disease. Prominent dural calcifications are again noted along the falx and tentorium. Vascular: Calcified atherosclerosis at the skull base. No hyperdense vessel. Skull: No fracture or suspicious osseous lesion. Sinuses/Orbits: Moderate  right ethmoid air cell opacification, slightly increased from yesterday's CT. Clear mastoid air cells. Unremarkable orbits. Other: None. IMPRESSION: 1. No evidence of acute intracranial abnormality. 2. Mild chronic small vessel ischemic disease. Electronically Signed   By: Logan Bores M.D.   On: 11/29/2020 12:25   CT HEAD WO CONTRAST (5MM)  Result Date: 11/28/2020 CLINICAL DATA:  Hypoglycemia. EXAM: CT HEAD WITHOUT CONTRAST TECHNIQUE: Contiguous axial images were obtained from the base of the skull through the vertex without intravenous contrast. COMPARISON:  September 12, 2020 FINDINGS: Brain: No evidence of acute infarction, hemorrhage, hydrocephalus, extra-axial collection or mass lesion/mass effect. Stable areas of calcification are seen throughout the  falx and tentorium. Vascular: No hyperdense vessel or unexpected calcification. Skull: Normal. Negative for fracture or focal lesion. Sinuses/Orbits: There is moderate severity right ethmoid sinus mucosal thickening. Other: None. IMPRESSION: 1. No acute intracranial pathology. Electronically Signed   By: Virgina Norfolk M.D.   On: 11/28/2020 21:17   CT Cervical Spine Wo Contrast  Result Date: 11/28/2020 CLINICAL DATA:  Hypoglycemia. EXAM: CT CERVICAL SPINE WITHOUT CONTRAST TECHNIQUE: Multidetector CT imaging of the cervical spine was performed without intravenous contrast. Multiplanar CT image reconstructions were also generated. COMPARISON:  None. FINDINGS: Alignment: Normal. Skull base and vertebrae: No acute fracture. No primary bone lesion or focal pathologic process. Soft tissues and spinal canal: No prevertebral fluid or swelling. No visible canal hematoma. Disc levels: Mild endplate sclerosis and mild to moderate severity anterior osteophyte formation is seen at the levels of C5-C6 and C6-C7. Mild intervertebral disc space narrowing is seen at the levels of C5-C6 and C6-C7. Mild, bilateral multilevel facet joint hypertrophy is noted. Upper chest: Negative. Other: None. IMPRESSION: 1. No acute fracture or subluxation of the cervical spine. 2. Mild to moderate severity degenerative changes at the levels of C5-C6 and C6-C7. Electronically Signed   By: Virgina Norfolk M.D.   On: 11/28/2020 21:25   US RENAL  Result Date: 12/01/2020 CLINICAL DATA:  Acute kidney injury. EXAM: RENAL / URINARY TRACT ULTRASOUND COMPLETE COMPARISON:  Renal ultrasound 08/02/2020 FINDINGS: Right Kidney: Renal measurements: 9.5 x 4.7 x 4.6 cm = volume: 108 mL. Borderline increased parenchymal echogenicity. No hydronephrosis. No visualized stone or focal lesion. Left Kidney: Renal measurements: 8.3 x 4.6 x 4.0 cm = volume: 81 mL. Limited visualization due to overlying bowel gas and rib shadowing. No hydronephrosis. More detailed  assessment is limited. Bladder: Appears normal for degree of bladder distention. Other: None. IMPRESSION: 1. No obstructive uropathy. 2. Limited evaluation of the left kidney due to overlying bowel gas and rib shadowing. 3. Suggestion of mild increased right renal parenchymal echogenicity suggesting chronic medical renal disease. Electronically Signed   By: Keith Rake M.D.   On: 12/01/2020 18:30   EEG adult  Result Date: 11/29/2020 Greta Doom, MD     11/29/2020  4:04 PM History: 70 yo M with AMS Sedation: None Technique: This EEG was acquired with electrodes placed according to the International 10-20 electrode system (including Fp1, Fp2, F3, F4, C3, C4, P3, P4, O1, O2, T3, T4, T5, T6, A1, A2, Fz, Cz, Pz). The following electrodes were missing or displaced: none. Background: The background is relatively low voltage but there is a posterior dominant rhythm of 9 Hz that attenuates with eye opening. In addition, there is generalized irregular delta and theta range intrusion into the background. Photic stimulation: Physiologic driving is not performed EEG Abnormalities: 1) Generalized irregular slow activity. Clinical Interpretation: This EEG is consistent with a  mild generalized non-specific cerebral dysfunction(encephalopathy). There was no seizure or seizure predisposition recorded on this study. Please note that lack of epileptiform activity on EEG does not preclude the possibility of epilepsy. Roland Rack, MD [facility] [phone] If 7pm- 7am, please page neurology on call as listed in [place].      Subjective: Patient seen and examined at bedside.  Poor historian.  Wants to go home today.  No overnight fever, vomiting reported.  Discharge Exam: Vitals:   12/04/20 0358 12/04/20 0738  BP: 137/86 (!) 146/78  Pulse: 76 75  Resp: 16 18  Temp: 98.7 F (37.1 C) 98.5 F (36.9 C)  SpO2: 100% 100%    General: Pt is alert, awake, not in acute distress.  Looks  chronically ill.  Currently on room air.  Poor historian. Cardiovascular: rate controlled, S1/S2 + Respiratory: bilateral decreased breath sounds at bases Abdominal: Soft, NT, ND, bowel sounds + Extremities: no edema, no cyanosis    The results of significant diagnostics from this hospitalization (including imaging, microbiology, ancillary and laboratory) are listed below for reference.     Microbiology: Recent Results (from the past 240 hour(s))  Resp Panel by RT-PCR (Flu A&B, Covid)     Status: None   Collection Time: 11/29/20  1:39 AM  Result Value Ref Range Status   SARS Coronavirus 2 by RT PCR NEGATIVE NEGATIVE Final    Comment: (NOTE) SARS-CoV-2 target nucleic acids are NOT DETECTED.  The SARS-CoV-2 RNA is generally detectable in upper respiratory specimens during the acute phase of infection. The lowest concentration of SARS-CoV-2 viral copies this assay can detect is 138 copies/mL. A negative result does not preclude SARS-Cov-2 infection and should not be used as the sole basis for treatment or other patient management decisions. A negative result may occur with  improper specimen collection/handling, submission of specimen other than nasopharyngeal swab, presence of viral mutation(s) within the areas targeted by this assay, and inadequate number of viral copies(<138 copies/mL). A negative result must be combined with clinical observations, patient history, and epidemiological information. The expected result is Negative.  Fact Sheet for Patients:  EntrepreneurPulse.com.au  Fact Sheet for Healthcare Providers:  IncredibleEmployment.be  This test is no t yet approved or cleared by the [place] FDA and  has been authorized for detection and/or diagnosis of SARS-CoV-2 by FDA under an Emergency Use Authorization (EUA). This EUA will remain  in effect (meaning this test can be used) for the duration of the COVID-19 declaration  under Section 564(b)(1) of the Act, 21 U.S.C.section 360bbb-3(b)(1), unless the authorization is terminated  or revoked sooner.       Influenza A by PCR NEGATIVE NEGATIVE Final   Influenza B by PCR NEGATIVE NEGATIVE Final    Comment: (NOTE) The Xpert Xpress SARS-CoV-2/FLU/RSV plus assay is intended as an aid in the diagnosis of influenza from Nasopharyngeal swab specimens and should not be used as a sole basis for treatment. Nasal washings and aspirates are unacceptable for Xpert Xpress SARS-CoV-2/FLU/RSV testing.  Fact Sheet for Patients: EntrepreneurPulse.com.au  Fact Sheet for Healthcare Providers: IncredibleEmployment.be  This test is not yet approved or cleared by the [place] FDA and has been authorized for detection and/or diagnosis of SARS-CoV-2 by FDA under an Emergency Use Authorization (EUA). This EUA will remain in effect (meaning this test can be used) for the duration of the COVID-19 declaration under Section 564(b)(1) of the Act, 21 U.S.C. section 360bbb-3(b)(1), unless the authorization is terminated or revoked.  Performed at [facility]  Lab, [place], [place] [address]      Labs: BNP (last 3 results) No results for input(s): BNP in the last 8760 hours. Basic Metabolic Panel: Recent Labs  Lab 11/29/20 1018 11/30/20 0444 12/01/20 0432 12/02/20 0455 12/03/20 0621 12/04/20 0342  NA  --  137 137 131* 137 133*  K  --  5.0 4.2 5.3* 4.2 4.1  CL  --  105 111 104 108 103  CO2  --  23 21* 21* 23 24  GLUCOSE  --  279* 204* 420* 161* 245*  BUN  --  26* 34* 36* 35* 22  CREATININE  --  1.51* 1.72* 1.78* 1.42* 1.16  CALCIUM  --  8.2* 8.0* 7.8* 8.1* 8.0*  MG 1.8 1.9 1.9 1.6* 1.9  --   PHOS 2.9 2.7 2.8 2.7  --   --    Liver Function Tests: No results for input(s): AST, ALT, ALKPHOS, BILITOT, PROT, ALBUMIN in the last 168 hours. No results for input(s): LIPASE, AMYLASE in the last 168 hours. Recent  Labs  Lab 11/29/20 1313  AMMONIA 13   CBC: Recent Labs  Lab 11/28/20 1800 11/29/20 0703 11/30/20 0444 12/01/20 0432 12/02/20 0455 12/03/20 0621 12/04/20 0342  WBC 3.0*   < > 2.5* 1.8*  1.8* 2.3* 1.9* 1.9*  NEUTROABS 1.7  --   --  0.9*  --   --   --   HGB 10.0*   < > 12.6* 11.1*  10.9* 10.3* 9.7* 10.0*  HCT 30.4*   < > 37.6* 32.5*  32.7* 30.8* 28.2* 29.8*  MCV 93.5   < > 90.4 91.3  89.8 92.2 89.2 88.4  PLT 130*   < > 133* 135*  129* 112* 110* 107*   < > = values in this interval not displayed.   Cardiac Enzymes: No results for input(s): CKTOTAL, CKMB, CKMBINDEX, TROPONINI in the last 168 hours. BNP: Invalid input(s): POCBNP CBG: Recent Labs  Lab 12/03/20 0723 12/03/20 1230 12/03/20 1641 12/03/20 2103 12/04/20 0753  GLUCAP 169* 191* 105* 185* 272*   D-Dimer No results for input(s): DDIMER in the last 72 hours. Hgb A1c No results for input(s): HGBA1C in the last 72 hours. Lipid Profile No results for input(s): CHOL, HDL, LDLCALC, TRIG, CHOLHDL, LDLDIRECT in the last 72 hours. Thyroid function studies No results for input(s): TSH, T4TOTAL, T3FREE, THYROIDAB in the last 72 hours.  Invalid input(s): FREET3 Anemia work up Recent Labs    12/03/20 0621  FERRITIN 212   Urinalysis    Component Value Date/Time   COLORURINE STRAW (A) 11/28/2020 1800   APPEARANCEUR CLEAR (A) 11/28/2020 1800   LABSPEC 1.008 11/28/2020 1800   PHURINE 6.0 11/28/2020 1800   GLUCOSEU 50 (A) 11/28/2020 1800   HGBUR SMALL (A) 11/28/2020 1800   BILIRUBINUR NEGATIVE 11/28/2020 1800   KETONESUR NEGATIVE 11/28/2020 1800   PROTEINUR NEGATIVE 11/28/2020 1800   NITRITE NEGATIVE 11/28/2020 1800   LEUKOCYTESUR NEGATIVE 11/28/2020 1800   Sepsis Labs Invalid input(s): PROCALCITONIN,  WBC,  LACTICIDVEN Microbiology Recent Results (from the past 240 hour(s))  Resp Panel by RT-PCR (Flu A&B, Covid)     Status: None   Collection Time: 11/29/20  1:39 AM  Result Value Ref Range Status   SARS  Coronavirus 2 by RT PCR NEGATIVE NEGATIVE Final    Comment: (NOTE) SARS-CoV-2 target nucleic acids are NOT DETECTED.  The SARS-CoV-2 RNA is generally detectable in upper respiratory specimens during the acute phase of infection. The lowest concentration of  SARS-CoV-2 viral copies this assay can detect is 138 copies/mL. A negative result does not preclude SARS-Cov-2 infection and should not be used as the sole basis for treatment or other patient management decisions. A negative result may occur with  improper specimen collection/handling, submission of specimen other than nasopharyngeal swab, presence of viral mutation(s) within the areas targeted by this assay, and inadequate number of viral copies(<138 copies/mL). A negative result must be combined with clinical observations, patient history, and epidemiological information. The expected result is Negative.  Fact Sheet for Patients:  EntrepreneurPulse.com.au  Fact Sheet for Healthcare Providers:  IncredibleEmployment.be  This test is no t yet approved or cleared by the [place] FDA and  has been authorized for detection and/or diagnosis of SARS-CoV-2 by FDA under an Emergency Use Authorization (EUA). This EUA will remain  in effect (meaning this test can be used) for the duration of the COVID-19 declaration under Section 564(b)(1) of the Act, 21 U.S.C.section 360bbb-3(b)(1), unless the authorization is terminated  or revoked sooner.       Influenza A by PCR NEGATIVE NEGATIVE Final   Influenza B by PCR NEGATIVE NEGATIVE Final    Comment: (NOTE) The Xpert Xpress SARS-CoV-2/FLU/RSV plus assay is intended as an aid in the diagnosis of influenza from Nasopharyngeal swab specimens and should not be used as a sole basis for treatment. Nasal washings and aspirates are unacceptable for Xpert Xpress SARS-CoV-2/FLU/RSV testing.  Fact Sheet for  Patients: EntrepreneurPulse.com.au  Fact Sheet for Healthcare Providers: IncredibleEmployment.be  This test is not yet approved or cleared by the [place] FDA and has been authorized for detection and/or diagnosis of SARS-CoV-2 by FDA under an Emergency Use Authorization (EUA). This EUA will remain in effect (meaning this test can be used) for the duration of the COVID-19 declaration under Section 564(b)(1) of the Act, 21 U.S.C. section 360bbb-3(b)(1), unless the authorization is terminated or revoked.  Performed at [facility], [address], [place], [place] [address]      Time coordinating discharge: 35 minutes  SIGNED:   Aline August, MD  [facility] 12/04/2020, 9:54 AM

## 2020-12-05 ENCOUNTER — Encounter: Payer: Self-pay | Admitting: Oncology

## 2020-12-06 ENCOUNTER — Observation Stay
Admission: EM | Admit: 2020-12-06 | Discharge: 2020-12-07 | Disposition: A | Discharge status: Home / Self Care | Payer: Medicare (Managed Care) | Attending: Internal Medicine | Admitting: Internal Medicine

## 2020-12-06 ENCOUNTER — Ambulatory Visit: Payer: Medicare HMO | Admitting: Family Medicine

## 2020-12-06 ENCOUNTER — Observation Stay: Payer: Medicare (Managed Care)

## 2020-12-06 ENCOUNTER — Other Ambulatory Visit: Payer: Self-pay

## 2020-12-06 DIAGNOSIS — Z794 Long term (current) use of insulin: Secondary | ICD-10-CM | POA: Diagnosis not present

## 2020-12-06 DIAGNOSIS — I129 Hypertensive chronic kidney disease with stage 1 through stage 4 chronic kidney disease, or unspecified chronic kidney disease: Secondary | ICD-10-CM | POA: Insufficient documentation

## 2020-12-06 DIAGNOSIS — Y900 Blood alcohol level of less than 20 mg/100 ml: Secondary | ICD-10-CM | POA: Diagnosis not present

## 2020-12-06 DIAGNOSIS — N1832 Chronic kidney disease, stage 3b: Secondary | ICD-10-CM | POA: Diagnosis not present

## 2020-12-06 DIAGNOSIS — Z79899 Other long term (current) drug therapy: Secondary | ICD-10-CM | POA: Insufficient documentation

## 2020-12-06 DIAGNOSIS — F1410 Cocaine abuse, uncomplicated: Secondary | ICD-10-CM | POA: Diagnosis present

## 2020-12-06 DIAGNOSIS — I440 Atrioventricular block, first degree: Secondary | ICD-10-CM | POA: Diagnosis present

## 2020-12-06 DIAGNOSIS — E7800 Pure hypercholesterolemia, unspecified: Secondary | ICD-10-CM | POA: Diagnosis present

## 2020-12-06 DIAGNOSIS — R531 Weakness: Secondary | ICD-10-CM | POA: Diagnosis present

## 2020-12-06 DIAGNOSIS — Z7982 Long term (current) use of aspirin: Secondary | ICD-10-CM | POA: Diagnosis not present

## 2020-12-06 DIAGNOSIS — N179 Acute kidney failure, unspecified: Principal | ICD-10-CM | POA: Insufficient documentation

## 2020-12-06 DIAGNOSIS — K279 Peptic ulcer, site unspecified, unspecified as acute or chronic, without hemorrhage or perforation: Secondary | ICD-10-CM | POA: Diagnosis present

## 2020-12-06 DIAGNOSIS — E785 Hyperlipidemia, unspecified: Secondary | ICD-10-CM | POA: Diagnosis present

## 2020-12-06 DIAGNOSIS — E1122 Type 2 diabetes mellitus with diabetic chronic kidney disease: Secondary | ICD-10-CM | POA: Diagnosis not present

## 2020-12-06 DIAGNOSIS — C9000 Multiple myeloma not having achieved remission: Secondary | ICD-10-CM | POA: Diagnosis present

## 2020-12-06 DIAGNOSIS — D472 Monoclonal gammopathy: Secondary | ICD-10-CM | POA: Diagnosis present

## 2020-12-06 DIAGNOSIS — E162 Hypoglycemia, unspecified: Secondary | ICD-10-CM | POA: Diagnosis present

## 2020-12-06 DIAGNOSIS — R0602 Shortness of breath: Secondary | ICD-10-CM

## 2020-12-06 DIAGNOSIS — Z20822 Contact with and (suspected) exposure to covid-19: Secondary | ICD-10-CM | POA: Insufficient documentation

## 2020-12-06 DIAGNOSIS — N183 Chronic kidney disease, stage 3 unspecified: Secondary | ICD-10-CM | POA: Diagnosis present

## 2020-12-06 DIAGNOSIS — I10 Essential (primary) hypertension: Secondary | ICD-10-CM | POA: Diagnosis present

## 2020-12-06 LAB — COMPREHENSIVE METABOLIC PANEL
ALT: 39 U/L (ref 0–44)
AST: 41 U/L (ref 15–41)
Albumin: 2.9 g/dL — ABNORMAL LOW (ref 3.5–5.0)
Alkaline Phosphatase: 46 U/L (ref 38–126)
Anion gap: 5 (ref 5–15)
BUN: 45 mg/dL — ABNORMAL HIGH (ref 8–23)
CO2: 26 mmol/L (ref 22–32)
Calcium: 8.3 mg/dL — ABNORMAL LOW (ref 8.9–10.3)
Chloride: 104 mmol/L (ref 98–111)
Creatinine, Ser: 3.12 mg/dL — ABNORMAL HIGH (ref 0.61–1.24)
GFR, Estimated: 21 mL/min — ABNORMAL LOW (ref >60)
Glucose, Bld: 88 mg/dL (ref 70–99)
Potassium: 4.1 mmol/L (ref 3.5–5.1)
Sodium: 135 mmol/L (ref 135–145)
Total Bilirubin: 0.7 mg/dL (ref 0.3–1.2)
Total Protein: 7.9 g/dL (ref 6.5–8.1)

## 2020-12-06 LAB — CBC WITH DIFFERENTIAL/PLATELET
Abs Immature Granulocytes: 0.01 10*3/uL (ref 0.00–0.07)
Basophils Absolute: 0 10*3/uL (ref 0.0–0.1)
Basophils Relative: 1 %
Eosinophils Absolute: 0 10*3/uL (ref 0.0–0.5)
Eosinophils Relative: 2 %
HCT: 26 % — ABNORMAL LOW (ref 39.0–52.0)
Hemoglobin: 9.1 g/dL — ABNORMAL LOW (ref 13.0–17.0)
Immature Granulocytes: 1 %
Lymphocytes Relative: 41 %
Lymphs Abs: 0.9 10*3/uL (ref 0.7–4.0)
MCH: 31.1 pg (ref 26.0–34.0)
MCHC: 35 g/dL (ref 30.0–36.0)
MCV: 88.7 fL (ref 80.0–100.0)
Monocytes Absolute: 0.4 10*3/uL (ref 0.1–1.0)
Monocytes Relative: 18 %
Neutro Abs: 0.7 10*3/uL — ABNORMAL LOW (ref 1.7–7.7)
Neutrophils Relative %: 37 %
Platelets: 112 10*3/uL — ABNORMAL LOW (ref 150–400)
RBC: 2.93 MIL/uL — ABNORMAL LOW (ref 4.22–5.81)
RDW: 13.6 % (ref 11.5–15.5)
Smear Review: NORMAL
WBC: 2 10*3/uL — ABNORMAL LOW (ref 4.0–10.5)
nRBC: 0 % (ref 0.0–0.2)

## 2020-12-06 LAB — URINE DRUG SCREEN, QUALITATIVE (ARMC ONLY)
Amphetamines, Ur Screen: NOT DETECTED
Barbiturates, Ur Screen: NOT DETECTED
Benzodiazepine, Ur Scrn: NOT DETECTED
Cannabinoid 50 Ng, Ur ~~LOC~~: POSITIVE — AB
Cocaine Metabolite,Ur ~~LOC~~: NOT DETECTED
MDMA (Ecstasy)Ur Screen: NOT DETECTED
Methadone Scn, Ur: NOT DETECTED
Opiate, Ur Screen: NOT DETECTED
Phencyclidine (PCP) Ur S: NOT DETECTED
Tricyclic, Ur Screen: NOT DETECTED

## 2020-12-06 LAB — TROPONIN I (HIGH SENSITIVITY)
Troponin I (High Sensitivity): 26 ng/L — ABNORMAL HIGH (ref <18)
Troponin I (High Sensitivity): 21 ng/L — ABNORMAL HIGH (ref <18)

## 2020-12-06 LAB — RESP PANEL BY RT-PCR (FLU A&B, COVID) ARPGX2
Influenza A by PCR: NEGATIVE
Influenza B by PCR: NEGATIVE
SARS Coronavirus 2 by RT PCR: NEGATIVE

## 2020-12-06 LAB — CK: Total CK: 221 U/L (ref 49–397)

## 2020-12-06 LAB — ETHANOL: Alcohol, Ethyl (B): <10 mg/dL (ref <10)

## 2020-12-06 IMAGING — DX DG CHEST 1V PORT
1 series · 1 of 1 positions shown · non-contrast
Comparison: [DATE]

CLINICAL DATA: Weakness short of breath

EXAM:
PORTABLE CHEST 1 VIEW

[chest ap]
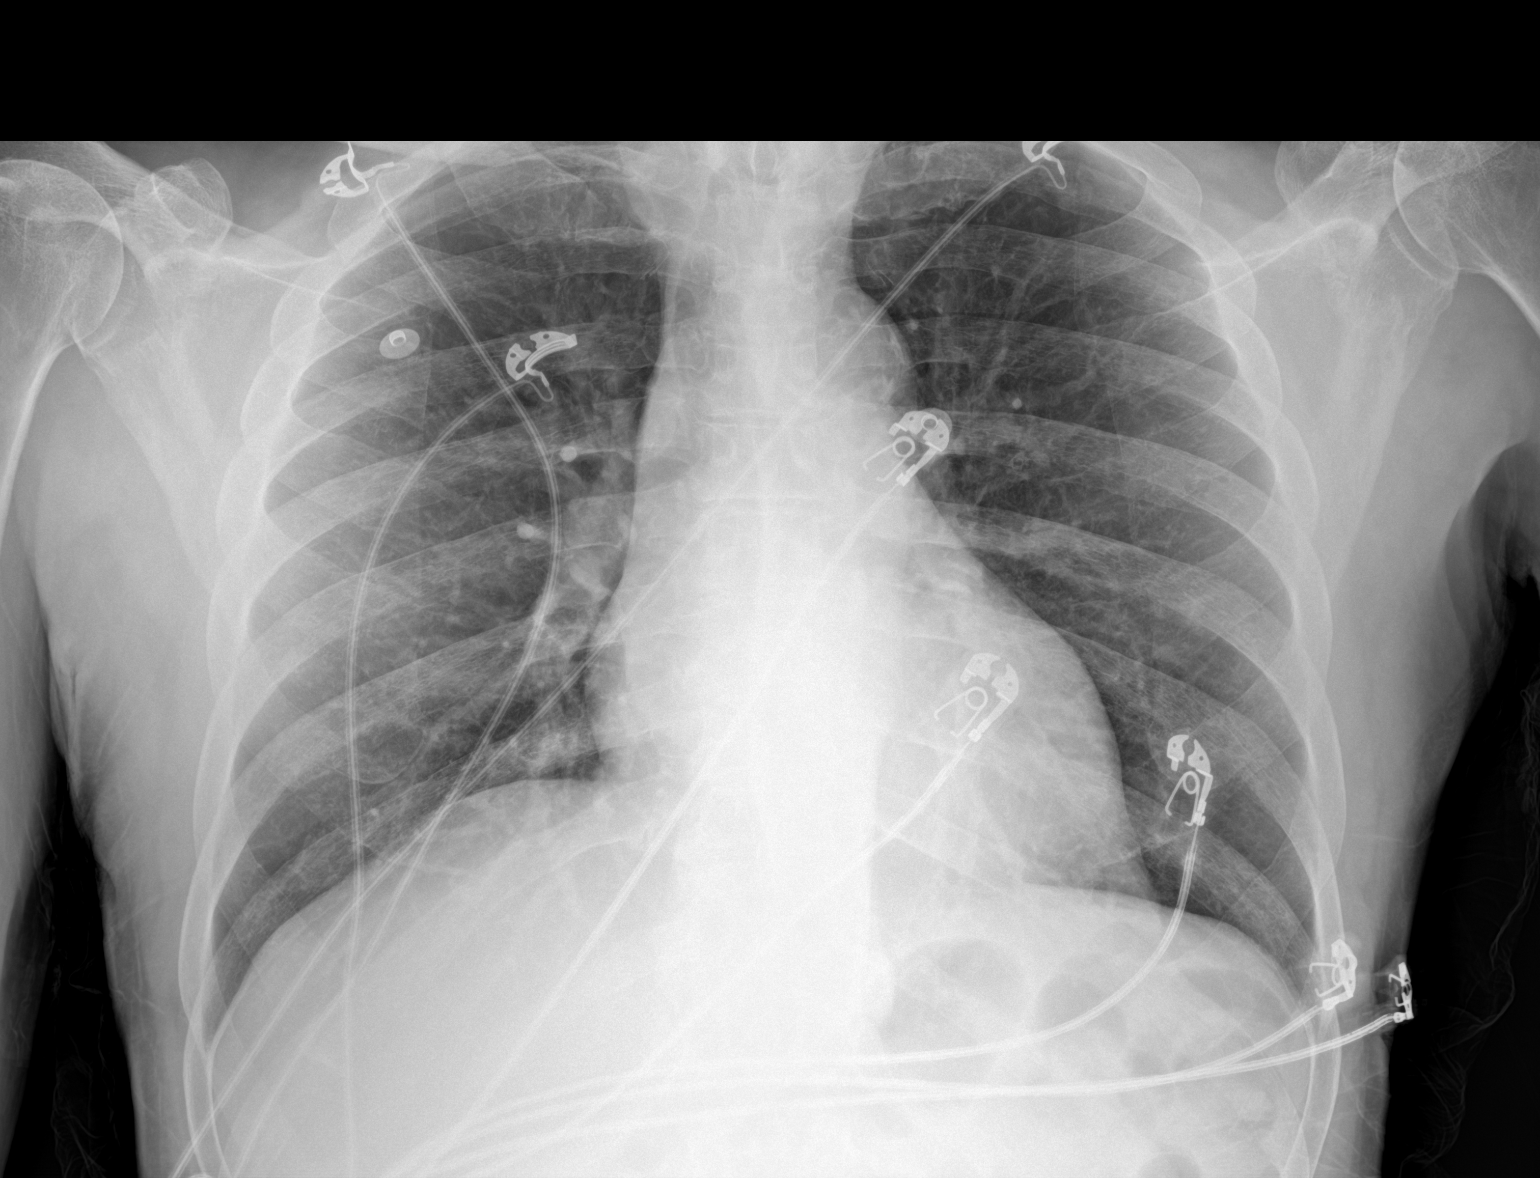

[1 of 1 positions shown; findings below may reference images not displayed]

FINDINGS: The heart size and mediastinal contours are within normal limits.
Aortic atherosclerosis. Both lungs are clear. The visualized
skeletal structures are unremarkable.
IMPRESSION: No active disease.

## 2020-12-06 MED ORDER — HEPARIN SODIUM (PORCINE) 5000 UNIT/ML IJ SOLN
5000.0000 [IU] | Freq: Three times a day (TID) | INTRAMUSCULAR | Status: DC
  Administered 2020-12-06 – 2020-12-07 (×2): 5000 [IU] via SUBCUTANEOUS
  Filled 2020-12-06 (×2): qty 1

## 2020-12-06 MED ORDER — SODIUM CHLORIDE 0.9 % IV BOLUS
1000.0000 mL | Freq: Once | INTRAVENOUS | Status: AC
  Administered 2020-12-06: 1000 mL via INTRAVENOUS

## 2020-12-06 MED ORDER — ONDANSETRON HCL 4 MG/2ML IJ SOLN: 4.0000 mg | Freq: Four times a day (QID) | INTRAMUSCULAR | Status: DC | PRN

## 2020-12-06 MED ORDER — INSULIN ASPART 100 UNIT/ML IJ SOLN: 0.0000 [IU] | Freq: Every day | INTRAMUSCULAR | Status: DC

## 2020-12-06 MED ORDER — SODIUM CHLORIDE 0.9 % IV SOLN: INTRAVENOUS | Status: DC

## 2020-12-06 MED ORDER — ACETAMINOPHEN 650 MG RE SUPP: 650.0000 mg | Freq: Four times a day (QID) | RECTAL | Status: DC | PRN

## 2020-12-06 MED ORDER — MIDAZOLAM HCL 2 MG/2ML IJ SOLN
1.0000 mg | INTRAMUSCULAR | Status: DC | PRN
Start: 2020-12-06 — End: 2020-12-07

## 2020-12-06 MED ORDER — ACETAMINOPHEN 325 MG PO TABS: 650.0000 mg | ORAL_TABLET | Freq: Four times a day (QID) | ORAL | Status: DC | PRN

## 2020-12-06 MED ORDER — ASPIRIN 81 MG PO CHEW
81.0000 mg | CHEWABLE_TABLET | Freq: Every day | ORAL | Status: DC
  Administered 2020-12-07: 81 mg via ORAL
  Filled 2020-12-06: qty 1

## 2020-12-06 MED ORDER — LORAZEPAM 2 MG/ML IJ SOLN
1.0000 mg | Freq: Once | INTRAMUSCULAR | Status: AC
  Administered 2020-12-06: 1 mg via INTRAVENOUS
  Filled 2020-12-06: qty 1

## 2020-12-06 MED ORDER — ONDANSETRON HCL 4 MG PO TABS: 4.0000 mg | ORAL_TABLET | Freq: Four times a day (QID) | ORAL | Status: DC | PRN

## 2020-12-06 MED ORDER — PRAVASTATIN SODIUM 20 MG PO TABS: 40.0000 mg | ORAL_TABLET | Freq: Every day | ORAL | Status: DC

## 2020-12-06 MED ORDER — INSULIN ASPART 100 UNIT/ML IJ SOLN
0.0000 [IU] | Freq: Three times a day (TID) | INTRAMUSCULAR | Status: DC
  Administered 2020-12-07: 3 [IU] via SUBCUTANEOUS
  Filled 2020-12-06: qty 1

## 2020-12-06 NOTE — ED Notes (Signed)
MD at bedside. 

## 2020-12-06 NOTE — ED Triage Notes (Signed)
Pt comes from home via [facility]. Pt states they were sitting at the pool and started feeling weak and light headed, got up to go inside and felt too weak to walk straight. Wife called EMS reporting "seizures" d/t pt stumbling. When medic arrived pt was Aox4 and hypotensive at 84/50s. 500 cc NS given en route.

## 2020-12-06 NOTE — H&P (Signed)
History and Physical   Nicholas Weiss:[id number] DOB: 01/05/1951 DOA: 12/06/2020  PCP: [facility]  Outpatient Specialists: Dr. Janese Banks, medical oncology Patient coming from: Home via EMS  I have personally briefly reviewed patient's old medical records in [place].  Chief Concern: Tremors  HPI: Nicholas Weiss is a 70 y.o. male with medical history significant for insulin-dependent diabetes mellitus, hypertension, smoldering multiple myeloma, chronic neutropenia/leukopenia, CKD 3B, anemia of chronic kidney disease, presents to the emergency department from home via EMS for chief concerns of tremors.  At bedside he is able to tell me his name, age, current location.  He asked when he can go home.    He had jerking motions.  He was aware of these jerking motions and abnormal walking the entire time.  He was sitting at the pool and resting. He got up and had jerking. He had sausage, eggs. He took his insulin today. 20 u tresiba and 10 u on sliding scale novolog (for 400 blood sugar).  He reports that the tremors and jerking motions resolved when EMS arrived.  He took his blood pressure medicines. He didn't take amlodipine. He took lisinopril   He denies passing out or syncope. He endorses jerking motions similar to last time.  Patient states that he drinks about 4 glasses of water per day.  He does endorse that he urinates less while he is at home than when he is in the hospital.  Social history: He lives with his son and girlfriend. He denies tobacco products. He endorses THC use. He reports the last time he used cocaine was 11/17/20. 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  Vaccination history: He is vaccinated for covid 19, Moderna, 3 doses   ROS: Constitutional: + weight change, no fever ENT/Mouth: no sore throat, no rhinorrhea Eyes: no eye pain, no vision changes Cardiovascular: no chest pain, no dyspnea,  no edema, no palpitations Respiratory: no cough,  no sputum, no wheezing Gastrointestinal: no nausea, no vomiting, no diarrhea, no constipation Genitourinary: no urinary incontinence, no dysuria, no hematuria Musculoskeletal: no arthralgias, no myalgias Skin: no skin lesions, no pruritus, Neuro: + weakness, no loss of consciousness, no syncope Psych: no anxiety, no depression, + decrease appetite Heme/Lymph: no bruising, no bleeding  ED Course: Discussed with emergency medicine provider, patient requiring hospitalization for chief concerns of hypotension and weakness.  Vitals in the emergency department was remarkable for temperature of 98.1, respiration rate of 18, heart rate of 74, initial blood pressure 119/69, and improved to 138/128.  SPO2 of 99% on room air.  Labs in the emergency department was remarkable for serum sodium of 135, potassium 4.1, chloride 104, bicarb 26, BUN of 45, serum creatinine of 3.12, nonfasting blood glucose 88, WBC 2.0, hemoglobin 9.1, platelets 112, EGFR 21.  In the emergency department patient was given Ativan 1 mg.  Assessment/Plan  Principal Problem:   AKI (acute kidney injury) ([place]) Active Problems:   Hypercholesterolemia   Essential (primary) hypertension   CKD (chronic kidney disease) stage 3, GFR 30-59 ml/min (HCC)   Hypoglycemia   Generalized weakness   PUD (peptic ulcer disease)   Heart block AV first degree   Smoldering multiple myeloma (HCC)   Cocaine abuse ([place])   # Weakness and hypotension-etiology is multifactorial including hypoglycemia, hypotension, prerenal acute kidney injury, cocaine use, noncompliance with medication, vs prerenal in setting of poor water intake - UDS has been ordered - Ethanol level has been ordered -  Status post 500 mL of fluid given by EMS, no documentation if the fluid is sodium chloride versus lactated ringer or anything else - Sodium chloride 150 mL/h ordered for 1 day - Fall precautions, seizures precaution  # Acute kidney injury-I suspect this is  prerenal complicated by cocaine use causing vasovagal spasms. - BMP in the a.m. History of hypertension - Holding home lisinopril 2.5 mg, amlodipine 10 mg daily at this time due to initial hypotension and acute kidney injury - Patient had a renal ultrasound on 12/01/2020 which showed no obstructive uropathy.  Limited evaluation of the left kidney due to overlying bowel gas and rib shadowing.  Suggestion of mild increase right renal parenchymal echogenicity suggesting chronic medical renal disease. - Urine sodium, urine osmolality has been ordered  # Tremors-etiology work-up in progress at this time, resolved with Versed  # History of cocaine use-UDS ordered and was negative for cocaine  # Multiple myeloma-outpatient follow-up with hematologist/oncologist  Chart reviewed.   Hospitalization from 11/28/2020 to 12/04/2020: He was admitted for acute metabolic encephalopathy, agitation, delirium. Psychiatry was consulted.  Patient initially required IV Haldol and empirically treated with thiamine. Mental status improved from baseline. He was also found to have acute kidney injury that resolved with IV fluids.  DVT prophylaxis: Heparin 5000 units subcutaneous every 8 hours Code Status: Full code Diet: Heart healthy Family Communication: None at this time Disposition Plan: Pending clinical course Consults called: None at this time Admission status: MedSurg, observation, telemetry  Past Medical History:  Diagnosis Date   DKA (diabetic ketoacidoses) 04/06/2016   Hypercholesteremia    Hypertension    Past Surgical History:  Procedure Laterality Date   COLONOSCOPY WITH PROPOFOL N/A 02/01/2020   Procedure: COLONOSCOPY WITH PROPOFOL;  Surgeon: Lin Landsman, MD;  Location: [place];  Service: Gastroenterology;  Laterality: N/A;   ESOPHAGOGASTRODUODENOSCOPY  02/01/2020   Procedure: ESOPHAGOGASTRODUODENOSCOPY (EGD);  Surgeon: Lin Landsman, MD;  Location[facility] ENDOSCOPY;  Service:  Gastroenterology;;   KNEE SURGERY Right    Torn meniscus   KNEE SURGERY Left    Social History:  reports that he has never smoked. He has never used smokeless tobacco. He reports current alcohol use. He reports that he does not currently use drugs after having used the following drugs: Marijuana.  Allergies  Allergen Reactions   Penicillins Other (See Comments)    Has patient had a PCN reaction causing immediate rash, facial/tongue/throat swelling, SOB or lightheadedness with hypotension: No Has patient had a PCN reaction causing severe rash involving mucus membranes or skin necrosis: No Has patient had a PCN reaction that required hospitalization No Has patient had a PCN reaction occurring within the last 10 years: No If all of the above answers are "NO", then may proceed with Cephalosporin use.    Family History  Problem Relation Age of Onset   Heart attack Father    Hypertension Sister    Cancer Sister    Family history: Family history reviewed and not pertinent  Prior to Admission medications   Medication Sig Start Date End Date Taking? Authorizing Provider  Alcohol Swabs (B-D SINGLE USE SWABS REGULAR) PADS Use to check blood sugar four times daily for type 1 diabetes 07/11/20   Birdie Sons, MD  amLODipine (NORVASC) 10 MG tablet Take 1 tablet (10 mg total) by mouth daily. 12/04/20   Aline August, MD  aspirin 81 MG chewable tablet Chew 1 tablet by mouth daily.    [provider]  Blood Glucose Calibration (  TRUE METRIX LEVEL 1) Low SOLN Use to check blood sugar four times daily for type 1 diabetes 07/11/20   Birdie Sons, MD  insulin aspart (NOVOLOG) 100 UNIT/ML injection Inject up to 14 units three times daily before meals according to sliding scale 06/25/20   Birdie Sons, MD  insulin degludec (TRESIBA FLEXTOUCH) 100 UNIT/ML FlexTouch Pen 15 units daily. 12/04/20   Aline August, MD  Insulin Syringe-Needle U-100 (INSULIN SYRINGE 1CC/31GX5/16") 31G X 5/16" 1 ML  MISC For insulin injections up to 4 times daily 07/11/20   Birdie Sons, MD  lisinopril (ZESTRIL) 2.5 MG tablet Take 2.5 mg by mouth daily. 11/08/20 11/08/21  [provider]  lovastatin (MEVACOR) 40 MG tablet Take 40 mg by mouth at bedtime. 04/10/19   [provider]  pregabalin (LYRICA) 75 MG capsule Take 1 capsule by mouth twice daily 11/08/20   Birdie Sons, MD   Physical Exam: Vitals:   12/06/20 1530 12/06/20 1730 12/06/20 1815 12/06/20 2021  BP: 123/72 (!) 173/92 (!) 177/93 (!) 179/99  Pulse: 75 79 80 81  Resp: (!) 25 15 15 18   Temp:    97.7 F (36.5 C)  TempSrc:    Oral  SpO2: 100% 100% 99% 100%  Weight:    75.5 kg  Height:    5' 5"  (1.651 m)   Constitutional: appears older than chronological age, NAD, calm, comfortable Eyes: PERRL, lids and conjunctivae normal ENMT: Mucous membranes are moist. Posterior pharynx clear of any exudate or lesions. Age-appropriate dentition. Hearing appropriate Neck: normal, supple, no masses, no thyromegaly Respiratory: clear to auscultation bilaterally, no wheezing, no crackles. Normal respiratory effort. No accessory muscle use.  Cardiovascular: Regular rate and rhythm, no murmurs / rubs / gallops. No extremity edema. 2+ pedal pulses. No carotid bruits.  Abdomen: no tenderness, no masses palpated, no hepatosplenomegaly. Bowel sounds positive.  Musculoskeletal: no clubbing / cyanosis. No joint deformity upper and lower extremities. Good ROM, no contractures, no atrophy. Normal muscle tone.  Skin: no rashes, lesions, ulcers. No induration Neurologic: Sensation intact. Strength 5/5 in all 4.  Psychiatric: Normal judgment and insight. Alert and oriented x 3.  Flat affect.   EKG: ordered  Chest x-ray on Admission: I personally reviewed and I agree with radiologist reading as below.  DG Chest Port 1 View  Result Date: 12/06/2020 CLINICAL DATA:  Weakness short of breath EXAM: PORTABLE CHEST 1 VIEW COMPARISON:  08/01/2020  FINDINGS: The heart size and mediastinal contours are within normal limits. Aortic atherosclerosis. Both lungs are clear. The visualized skeletal structures are unremarkable. IMPRESSION: No active disease. Electronically Signed   By: Donavan Foil M.D.   On: 12/06/2020 18:40    Labs on Admission: I have personally reviewed following labs  CBC: Recent Labs  Lab 12/01/20 0432 12/02/20 0455 12/03/20 0621 12/04/20 0342 12/06/20 1520  WBC 1.8*  1.8* 2.3* 1.9* 1.9* 2.0*  NEUTROABS 0.9*  --   --   --  0.7*  HGB 11.1*  10.9* 10.3* 9.7* 10.0* 9.1*  HCT 32.5*  32.7* 30.8* 28.2* 29.8* 26.0*  MCV 91.3  89.8 92.2 89.2 88.4 88.7  PLT 135*  129* 112* 110* 107* 706*   Basic Metabolic Panel: Recent Labs  Lab 11/30/20 0444 12/01/20 0432 12/02/20 0455 12/03/20 0621 12/04/20 0342 12/06/20 1520  NA 137 137 131* 137 133* 135  K 5.0 4.2 5.3* 4.2 4.1 4.1  CL 105 111 104 108 103 104  CO2 23 21* 21* 23 24 26  GLUCOSE 279* 204* 420* 161* 245* 88  BUN 26* 34* 36* 35* 22 45*  CREATININE 1.51* 1.72* 1.78* 1.42* 1.16 3.12*  CALCIUM 8.2* 8.0* 7.8* 8.1* 8.0* 8.3*  MG 1.9 1.9 1.6* 1.9  --   --   PHOS 2.7 2.8 2.7  --   --   --    GFR: Estimated Creatinine Clearance: 21.8 mL/min (A) (by C-G formula based on SCr of 3.12 mg/dL (H)).  Liver Function Tests: Recent Labs  Lab 12/06/20 1520  AST 41  ALT 39  ALKPHOS 46  BILITOT 0.7  PROT 7.9  ALBUMIN 2.9*   CBG: Recent Labs  Lab 12/03/20 0723 12/03/20 1230 12/03/20 1641 12/03/20 2103 12/04/20 0753  GLUCAP 169* 191* 105* 185* 272*   Urine analysis:    Component Value Date/Time   COLORURINE STRAW (A) 11/28/2020 1800   APPEARANCEUR CLEAR (A) 11/28/2020 1800   LABSPEC 1.008 11/28/2020 1800   PHURINE 6.0 11/28/2020 1800   GLUCOSEU 50 (A) 11/28/2020 1800   HGBUR SMALL (A) 11/28/2020 1800   BILIRUBINUR NEGATIVE 11/28/2020 1800   KETONESUR NEGATIVE 11/28/2020 1800   PROTEINUR NEGATIVE 11/28/2020 1800   NITRITE NEGATIVE 11/28/2020 1800    LEUKOCYTESUR NEGATIVE 11/28/2020 1800   Dr. Tobie Poet [facility]  If 7PM-7AM, please contact overnight-coverage provider If 7AM-7PM, please contact day coverage provider [email or web address]  12/06/2020, 10:21 PM

## 2020-12-06 NOTE — ED Notes (Signed)
Rainbow tubed to lab at this time.

## 2020-12-06 NOTE — ED Provider Notes (Signed)
[facility] Emergency Department Provider Note   ____________________________________________   Event Date/Time   First MD Initiated Contact with Patient 12/06/20 1516     (approximate)  I have reviewed the triage vital signs and the nursing notes.   HISTORY  Chief Complaint Hypotension (Symptomatic hypotension)    HPI Nicholas Weiss is a 70 y.o. male who presents via EMS after an episode of presyncope and weakness and found to be hypotensive in the 80s/50s  LOCATION: Generalized DURATION: Just prior to arrival TIMING: Resolved since onset SEVERITY: Severe QUALITY: Generalized weakness and hypotension CONTEXT: Patient states that he got up from a seated position and felt significant weakness in his lower extremities and was stumbling therefore his wife called EMS for evaluation and found him to be hypotensive.  Patient responded to fluids MODIFYING FACTORS: Getting up from a seated position worsens this lightheadedness and weakness and is partially relieved laying flat ASSOCIATED SYMPTOMS: Denies   Per medical record review, patient has history of cocaine abuse          Past Medical History:  Diagnosis Date   DKA (diabetic ketoacidoses) 04/06/2016   Hypercholesteremia    Hypertension     Patient Active Problem List   Diagnosis Date Noted   Acute metabolic encephalopathy 44/05/4740   Cocaine abuse ([place]) 11/30/2020   Muscle twitching 09/12/2020   Smoldering multiple myeloma ([place]) 08/02/2020   Pre-syncope 08/01/2020   Heart block AV first degree 08/01/2020   Anemia of chronic kidney failure, stage 3 (moderate) ([place]) 05/28/2020   PUD (peptic ulcer disease)    Esophageal dysphagia    Encounter for screening colonoscopy    Hyperglycemia due to type 2 diabetes mellitus ([place]) 07/28/2019   Generalized weakness 07/28/2019   Type 2 diabetes mellitus with hyperglycemia ([place]) 07/28/2019   Hypoglycemia 04/27/2019   Unresponsiveness 04/27/2019    Lung nodule 04/07/2019   Uncontrolled type 1 diabetes mellitus ([place])    Acute renal failure with acute tubular necrosis superimposed on stage 3a chronic kidney disease ([place])    Hypertensive urgency 04/05/2019   AKI (acute kidney injury) ([place]) 04/05/2019   CKD (chronic kidney disease) stage 3, GFR 30-59 ml/min ([place]) 04/05/2019   DKA (diabetic ketoacidoses) 04/06/2016   Hypercholesterolemia 01/24/2016   Essential (primary) hypertension 06/07/2006   Type 1 diabetes mellitus with diabetic neuropathy, unspecified ([place]) 05/31/2006    Past Surgical History:  Procedure Laterality Date   COLONOSCOPY WITH PROPOFOL N/A 02/01/2020   Procedure: COLONOSCOPY WITH PROPOFOL;  Surgeon: Lin Landsman, MD;  Location: [facility] ENDOSCOPY;  Service: Gastroenterology;  Laterality: N/A;   ESOPHAGOGASTRODUODENOSCOPY  02/01/2020   Procedure: ESOPHAGOGASTRODUODENOSCOPY (EGD);  Surgeon: Lin Landsman, MD;  Location[facility] ENDOSCOPY;  Service: Gastroenterology;;   KNEE SURGERY Right    Torn meniscus   KNEE SURGERY Left     Prior to Admission medications   Medication Sig Start Date End Date Taking? Authorizing Provider  insulin aspart (NOVOLOG) 100 UNIT/ML injection Inject up to 14 units three times daily before meals according to sliding scale 06/25/20  Yes Fisher, Kirstie Peri, MD  insulin degludec (TRESIBA FLEXTOUCH) 100 UNIT/ML FlexTouch Pen 15 units daily. Patient taking differently: Inject 15 Units into the skin daily. 15 units daily. 12/04/20  Yes Aline August, MD  lisinopril (ZESTRIL) 2.5 MG tablet Take 2.5 mg by mouth daily. 11/08/20 11/08/21 Yes [provider]  lovastatin (MEVACOR) 40 MG tablet Take 40 mg by mouth at bedtime. 04/10/19  Yes [provider]  pregabalin (LYRICA)  75 MG capsule Take 1 capsule by mouth twice daily 11/08/20  Yes Birdie Sons, MD  Alcohol Swabs (B-D SINGLE USE SWABS REGULAR) PADS Use to check blood sugar four times daily for type 1 diabetes 07/11/20   Birdie Sons, MD  amLODipine (NORVASC) 10 MG tablet Take 1 tablet (10 mg total) by mouth daily. Patient not taking: Reported on 12/06/2020 12/04/20   Aline August, MD  aspirin 81 MG chewable tablet Chew 1 tablet by mouth daily. Patient not taking: Reported on 12/06/2020    [provider]  Blood Glucose Calibration (TRUE METRIX LEVEL 1) Low SOLN Use to check blood sugar four times daily for type 1 diabetes 07/11/20   Birdie Sons, MD  Insulin Syringe-Needle U-100 (INSULIN SYRINGE 1CC/31GX5/16") 31G X 5/16" 1 ML MISC For insulin injections up to 4 times daily 07/11/20   Birdie Sons, MD    Allergies Penicillins  Family History  Problem Relation Age of Onset   Heart attack Father    Hypertension Sister    Cancer Sister     Social History Social History   Tobacco Use   Smoking status: Never   Smokeless tobacco: Never  Vaping Use   Vaping Use: Never used  Substance Use Topics   Alcohol use: Yes    Alcohol/week: 0.0 - 1.0 standard drinks    Comment: "once every 2 months"   Drug use: Not Currently    Types: Marijuana    Comment: "none in a long time'    Review of Systems Constitutional: No fever/chills Eyes: No visual changes. ENT: No sore throat. Cardiovascular: Denies chest pain. Respiratory: Denies shortness of breath. Gastrointestinal: No abdominal pain.  No nausea, no vomiting.  No diarrhea. Genitourinary: Negative for dysuria. Musculoskeletal: Negative for acute arthralgias Skin: Negative for rash. Neurological: Negative for headaches, weakness/numbness/paresthesias in any extremity Psychiatric: Negative for suicidal ideation/homicidal ideation   ____________________________________________   PHYSICAL EXAM:  VITAL SIGNS: ED Triage Vitals  Enc Vitals Group     BP 12/06/20 1514 119/69     Pulse Rate 12/06/20 1514 74     Resp 12/06/20 1514 18     Temp 12/06/20 1514 98.1 F (36.7 C)     Temp Source 12/06/20 1514 Oral     SpO2 12/06/20 1514 99 %      Weight 12/06/20 1517 166 lb 0.1 oz (75.3 kg)     Height 12/06/20 1517 5' 10.5" (1.791 m)     Head Circumference --      Peak Flow --      Pain Score 12/06/20 1517 0     Pain Loc --      Pain Edu? --      Excl. in [place]? --    Constitutional: Alert and oriented. Well appearing and in no acute distress. Eyes: Conjunctivae are normal. PERRL. Head: Atraumatic. Nose: No congestion/rhinnorhea. Mouth/Throat: Mucous membranes are moist. Neck: No stridor Cardiovascular: Grossly normal heart sounds.  Good peripheral circulation. Respiratory: Normal respiratory effort.  No retractions. Gastrointestinal: Soft and nontender. No distention. Musculoskeletal: No obvious deformities Neurologic:  Normal speech and language. No gross focal neurologic deficits are appreciated. Skin:  Skin is warm and dry. No rash noted. Psychiatric: Mood and affect are normal. Speech and behavior are normal.  ____________________________________________   LABS (all labs ordered are listed, but only abnormal results are displayed)  Labs Reviewed  URINE DRUG SCREEN, QUALITATIVE ([place]) - Abnormal; Notable for the following components:  Result Value   Cannabinoid 50 Ng, Ur [place] POSITIVE (*)    All other components within normal limits  CBC WITH DIFFERENTIAL/PLATELET - Abnormal; Notable for the following components:   WBC 2.0 (*)    RBC 2.93 (*)    Hemoglobin 9.1 (*)    HCT 26.0 (*)    Platelets 112 (*)    Neutro Abs 0.7 (*)    All other components within normal limits  COMPREHENSIVE METABOLIC PANEL - Abnormal; Notable for the following components:   BUN 45 (*)    Creatinine, Ser 3.12 (*)    Calcium 8.3 (*)    Albumin 2.9 (*)    GFR, Estimated 21 (*)    All other components within normal limits  TROPONIN I (HIGH SENSITIVITY) - Abnormal; Notable for the following components:   Troponin I (High Sensitivity) 26 (*)    All other components within normal limits  TROPONIN I (HIGH SENSITIVITY) - Abnormal;  Notable for the following components:   Troponin I (High Sensitivity) 21 (*)    All other components within normal limits  RESP PANEL BY RT-PCR (FLU A&B, COVID) ARPGX2  ETHANOL  CK  BASIC METABOLIC PANEL  CBC  SODIUM, URINE, RANDOM  OSMOLALITY, URINE   RADIOLOGY  ED MD interpretation: One-view portable chest x-ray shows no evidence of acute abnormalities including no pneumonia, pneumothorax, or widened mediastinum  Official radiology report(s): DG Chest Port 1 View  Result Date: 12/06/2020 CLINICAL DATA:  Weakness short of breath EXAM: PORTABLE CHEST 1 VIEW COMPARISON:  08/01/2020 FINDINGS: The heart size and mediastinal contours are within normal limits. Aortic atherosclerosis. Both lungs are clear. The visualized skeletal structures are unremarkable. IMPRESSION: No active disease. Electronically Signed   By: Donavan Foil M.D.   On: 12/06/2020 18:40    ____________________________________________   PROCEDURES  Procedure(s) performed (including Critical Care):  .1-3 Lead EKG Interpretation Performed by: Naaman Plummer, MD Authorized by: Naaman Plummer, MD     Interpretation: normal     ECG rate:  80   ECG rate assessment: normal     Rhythm: sinus rhythm     Ectopy: none     Conduction: normal     ____________________________________________   INITIAL IMPRESSION / ASSESSMENT AND PLAN / ED COURSE  As part of my medical decision making, I reviewed the following data within the electronic medical record, if available:  Nursing notes reviewed and incorporated, Labs reviewed, EKG interpreted, Old chart reviewed, Radiograph reviewed and Notes from prior ED visits reviewed and incorporated        This patient presents with generalized weakness and fatigue likely secondary to dehydration. Suspect acute kidney injury of prerenal origin. Doubt intrinsic renal dysfunction or obstructive nephropathy. Considered alternate etiologies of the patients symptoms including infectious  processes, severe metabolic derangements or electrolyte abnormalities, ischemia/ACS, heart failure, and intracranial/central processes but think these are unlikely given the history and physical exam.  Plan: labs, 1Lfluid resuscitation, pain/nausea control, reassessment  Dispo: Admit to medicine      ____________________________________________   FINAL CLINICAL IMPRESSION(S) / ED DIAGNOSES  Final diagnoses:  Shortness of breath  Weakness     ED Discharge Orders     None        Note:  This document was prepared using Dragon voice recognition software and may include unintentional dictation errors.    Naaman Plummer, MD 12/06/20 [phone]

## 2020-12-07 DIAGNOSIS — N179 Acute kidney failure, unspecified: Secondary | ICD-10-CM | POA: Diagnosis not present

## 2020-12-07 LAB — BASIC METABOLIC PANEL
Anion gap: 3 — ABNORMAL LOW (ref 5–15)
BUN: 38 mg/dL — ABNORMAL HIGH (ref 8–23)
CO2: 26 mmol/L (ref 22–32)
Calcium: 8.4 mg/dL — ABNORMAL LOW (ref 8.9–10.3)
Chloride: 105 mmol/L (ref 98–111)
Creatinine, Ser: 1.85 mg/dL — ABNORMAL HIGH (ref 0.61–1.24)
GFR, Estimated: 39 mL/min — ABNORMAL LOW (ref >60)
Glucose, Bld: 283 mg/dL — ABNORMAL HIGH (ref 70–99)
Potassium: 4.8 mmol/L (ref 3.5–5.1)
Sodium: 134 mmol/L — ABNORMAL LOW (ref 135–145)

## 2020-12-07 LAB — CBC
HCT: 30.5 % — ABNORMAL LOW (ref 39.0–52.0)
Hemoglobin: 10.5 g/dL — ABNORMAL LOW (ref 13.0–17.0)
MCH: 30.4 pg (ref 26.0–34.0)
MCHC: 34.4 g/dL (ref 30.0–36.0)
MCV: 88.4 fL (ref 80.0–100.0)
Platelets: 129 10*3/uL — ABNORMAL LOW (ref 150–400)
RBC: 3.45 MIL/uL — ABNORMAL LOW (ref 4.22–5.81)
RDW: 13.6 % (ref 11.5–15.5)
WBC: 2.3 10*3/uL — ABNORMAL LOW (ref 4.0–10.5)
nRBC: 0 % (ref 0.0–0.2)

## 2020-12-07 LAB — SODIUM, URINE, RANDOM: Sodium, Ur: 46 mmol/L

## 2020-12-07 LAB — GLUCOSE, CAPILLARY
Glucose-Capillary: 356 mg/dL — ABNORMAL HIGH (ref 70–99)
Glucose-Capillary: 232 mg/dL — ABNORMAL HIGH (ref 70–99)

## 2020-12-07 LAB — OSMOLALITY, URINE: Osmolality, Ur: 368 mOsm/kg (ref 300–900)

## 2020-12-07 MED ORDER — LABETALOL HCL 5 MG/ML IV SOLN: 20.0000 mg | INTRAVENOUS | Status: DC | PRN

## 2020-12-07 MED ORDER — HYDRALAZINE HCL 20 MG/ML IJ SOLN
10.0000 mg | Freq: Four times a day (QID) | INTRAMUSCULAR | Status: DC | PRN
  Administered 2020-12-07: 10 mg via INTRAVENOUS
  Filled 2020-12-07: qty 1

## 2020-12-07 NOTE — Discharge Summary (Signed)
Physician Discharge Summary  Nicholas Weiss TTS:[id number] DOB: 1951-01-09 DOA: 12/06/2020  PCP: [facility] date: 12/06/2020 Discharge date: 12/07/2020  Admitted From: Home Disposition: Home  Recommendations for Outpatient Follow-up:  Follow up with PCP in 1-2 weeks Please obtain BMP/CBC in one week Please follow up on the following pending results: None  Home Health: No Equipment/Devices: None Discharge Condition: Stable CODE STATUS: Full Diet recommendation: Heart Healthy / Carb Modified   Brief/Interim Summary:  Nicholas Weiss is a 70 y.o. male with medical history significant for insulin-dependent diabetes mellitus, hypertension, smoldering multiple myeloma, chronic neutropenia/leukopenia, CKD 3B, anemia of chronic kidney disease, presents to the emergency department from home via EMS for chief concerns of tremors. Patient was experiencing intermittent abnormal jerky movements for the past year.  Admitted for AKI secondary to dehydration.  Renal function improved with IV fluid.  Next morning he was feeling at baseline.  UDS positive for cannabinoid.  He was counseled again for substance abuse and advised to follow-up closely with PCP who can refer him to see a neurologist if needed for those abnormal jerky movements.  No jerky movements while he was in the hospital.  Recent hospitalization with concern of acute metabolic encephalopathy, agitation and delirium.  Psych was also consulted.  He was treated empirically with thiamine.  He had AKI during that admission which resolved with IV fluid.  Patient can resume his home medications and follow-up with his providers for further recommendations.  Discharge Diagnoses:  Principal Problem:   AKI (acute kidney injury) ([place]) Active Problems:   Hypercholesterolemia   Essential (primary) hypertension   CKD (chronic kidney disease) stage 3, GFR 30-59 ml/min (HCC)   Hypoglycemia   Generalized weakness   PUD (peptic ulcer  disease)   Heart block AV first degree   Smoldering multiple myeloma ([place])   Cocaine abuse ([place])   Discharge Instructions  Discharge Instructions     Diet - low sodium heart healthy   Complete by: As directed    Discharge instructions   Complete by: As directed    It was pleasure taking care of you. Continue taking your medications and follow up closely with your doctor. Keep your self well-hydrated. Your primary care doctor also gave you a referral to see a neurologist if those abnormal jerking movements continues.   Increase activity slowly   Complete by: As directed       Allergies as of 12/07/2020       Reactions   Penicillins Other (See Comments)   Has patient had a PCN reaction causing immediate rash, facial/tongue/throat swelling, SOB or lightheadedness with hypotension: No Has patient had a PCN reaction causing severe rash involving mucus membranes or skin necrosis: No Has patient had a PCN reaction that required hospitalization No Has patient had a PCN reaction occurring within the last 10 years: No If all of the above answers are "NO", then may proceed with Cephalosporin use.        Medication List     TAKE these medications    amLODipine 10 MG tablet Commonly known as: NORVASC Take 1 tablet (10 mg total) by mouth daily.   aspirin 81 MG chewable tablet Chew 1 tablet by mouth daily.   B-D SINGLE USE SWABS REGULAR Pads Use to check blood sugar four times daily for type 1 diabetes   insulin aspart 100 UNIT/ML injection Commonly known as: novoLOG Inject up to 14 units three times daily before meals according to sliding scale  INSULIN SYRINGE 1CC/31GX5/16" 31G X 5/16" 1 ML Misc For insulin injections up to 4 times daily   lisinopril 2.5 MG tablet Commonly known as: ZESTRIL Take 2.5 mg by mouth daily.   lovastatin 40 MG tablet Commonly known as: MEVACOR Take 40 mg by mouth at bedtime.   pregabalin 75 MG capsule Commonly known as: LYRICA Take 1  capsule by mouth twice daily   Tresiba FlexTouch 100 UNIT/ML FlexTouch Pen Generic drug: insulin degludec 15 units daily. What changed:  how much to take how to take this when to take this   True Metrix Level 1 Low Soln Use to check blood sugar four times daily for type 1 diabetes        Follow-up Information     [facility]. Schedule an appointment as soon as possible for a visit in 1 week(s).   Contact information: [place] [phone]                Allergies  Allergen Reactions   Penicillins Other (See Comments)    Has patient had a PCN reaction causing immediate rash, facial/tongue/throat swelling, SOB or lightheadedness with hypotension: No Has patient had a PCN reaction causing severe rash involving mucus membranes or skin necrosis: No Has patient had a PCN reaction that required hospitalization No Has patient had a PCN reaction occurring within the last 10 years: No If all of the above answers are "NO", then may proceed with Cephalosporin use.     Consultations: None  Procedures/Studies: CT HEAD WO CONTRAST (5MM)  Result Date: 11/29/2020 CLINICAL DATA:  Head trauma.  Hypoglycemia and involuntary movement. EXAM: CT HEAD WITHOUT CONTRAST TECHNIQUE: Contiguous axial images were obtained from the base of the skull through the vertex without intravenous contrast. COMPARISON:  Head CT 11/28/2020 and MRI 09/12/2020 FINDINGS: Brain: There is no evidence of an acute infarct, intracranial hemorrhage, mass, midline shift, or extra-axial fluid collection. The ventricles and sulci are normal. Hypodensities in the cerebral white matter are unchanged and nonspecific but compatible with mild chronic small vessel ischemic disease. Prominent dural calcifications are again noted along the falx and tentorium. Vascular: Calcified atherosclerosis at the skull base. No hyperdense vessel. Skull: No fracture or suspicious osseous lesion.  Sinuses/Orbits: Moderate right ethmoid air cell opacification, slightly increased from yesterday's CT. Clear mastoid air cells. Unremarkable orbits. Other: None. IMPRESSION: 1. No evidence of acute intracranial abnormality. 2. Mild chronic small vessel ischemic disease. Electronically Signed   By: Logan Bores M.D.   On: 11/29/2020 12:25   CT HEAD WO CONTRAST (5MM)  Result Date: 11/28/2020 CLINICAL DATA:  Hypoglycemia. EXAM: CT HEAD WITHOUT CONTRAST TECHNIQUE: Contiguous axial images were obtained from the base of the skull through the vertex without intravenous contrast. COMPARISON:  September 12, 2020 FINDINGS: Brain: No evidence of acute infarction, hemorrhage, hydrocephalus, extra-axial collection or mass lesion/mass effect. Stable areas of calcification are seen throughout the falx and tentorium. Vascular: No hyperdense vessel or unexpected calcification. Skull: Normal. Negative for fracture or focal lesion. Sinuses/Orbits: There is moderate severity right ethmoid sinus mucosal thickening. Other: None. IMPRESSION: 1. No acute intracranial pathology. Electronically Signed   By: Virgina Norfolk M.D.   On: 11/28/2020 21:17   CT Cervical Spine Wo Contrast  Result Date: 11/28/2020 CLINICAL DATA:  Hypoglycemia. EXAM: CT CERVICAL SPINE WITHOUT CONTRAST TECHNIQUE: Multidetector CT imaging of the cervical spine was performed without intravenous contrast. Multiplanar CT image reconstructions were also generated. COMPARISON:  None. FINDINGS: Alignment: Normal.  Skull base and vertebrae: No acute fracture. No primary bone lesion or focal pathologic process. Soft tissues and spinal canal: No prevertebral fluid or swelling. No visible canal hematoma. Disc levels: Mild endplate sclerosis and mild to moderate severity anterior osteophyte formation is seen at the levels of C5-C6 and C6-C7. Mild intervertebral disc space narrowing is seen at the levels of C5-C6 and C6-C7. Mild, bilateral multilevel facet joint hypertrophy is  noted. Upper chest: Negative. Other: None. IMPRESSION: 1. No acute fracture or subluxation of the cervical spine. 2. Mild to moderate severity degenerative changes at the levels of C5-C6 and C6-C7. Electronically Signed   By: Virgina Norfolk M.D.   On: 11/28/2020 21:25   US RENAL  Result Date: 12/01/2020 CLINICAL DATA:  Acute kidney injury. EXAM: RENAL / URINARY TRACT ULTRASOUND COMPLETE COMPARISON:  Renal ultrasound 08/02/2020 FINDINGS: Right Kidney: Renal measurements: 9.5 x 4.7 x 4.6 cm = volume: 108 mL. Borderline increased parenchymal echogenicity. No hydronephrosis. No visualized stone or focal lesion. Left Kidney: Renal measurements: 8.3 x 4.6 x 4.0 cm = volume: 81 mL. Limited visualization due to overlying bowel gas and rib shadowing. No hydronephrosis. More detailed assessment is limited. Bladder: Appears normal for degree of bladder distention. Other: None. IMPRESSION: 1. No obstructive uropathy. 2. Limited evaluation of the left kidney due to overlying bowel gas and rib shadowing. 3. Suggestion of mild increased right renal parenchymal echogenicity suggesting chronic medical renal disease. Electronically Signed   By: Keith Rake M.D.   On: 12/01/2020 18:30   DG Chest Port 1 View  Result Date: 12/06/2020 CLINICAL DATA:  Weakness short of breath EXAM: PORTABLE CHEST 1 VIEW COMPARISON:  08/01/2020 FINDINGS: The heart size and mediastinal contours are within normal limits. Aortic atherosclerosis. Both lungs are clear. The visualized skeletal structures are unremarkable. IMPRESSION: No active disease. Electronically Signed   By: Donavan Foil M.D.   On: 12/06/2020 18:40   EEG adult  Result Date: 11/29/2020 Greta Doom, MD     11/29/2020  4:04 PM History: 70 yo M with AMS Sedation: None Technique: This EEG was acquired with electrodes placed according to the International 10-20 electrode system (including Fp1, Fp2, F3, F4, C3, C4, P3, P4, O1, O2, T3, T4, T5, T6, A1, A2, Fz, Cz, Pz). The  following electrodes were missing or displaced: none. Background: The background is relatively low voltage but there is a posterior dominant rhythm of 9 Hz that attenuates with eye opening. In addition, there is generalized irregular delta and theta range intrusion into the background. Photic stimulation: Physiologic driving is not performed EEG Abnormalities: 1) Generalized irregular slow activity. Clinical Interpretation: This EEG is consistent with a mild generalized non-specific cerebral dysfunction(encephalopathy). There was no seizure or seizure predisposition recorded on this study. Please note that lack of epileptiform activity on EEG does not preclude the possibility of epilepsy. Roland Rack, MD [facility] [phone] If 7pm- 7am, please page neurology on call as listed in [place].    Subjective: Patient was seen and examined today.  Denies any complaints.  He feels that he is at his baseline.  We discussed about staying hydrated and away from street drugs.  No abnormal jerking movements since in hospital. Also advised to see a neurologist if his abnormal jerky movements persists.  Discharge Exam: Vitals:   12/07/20 0454 12/07/20 0734  BP: (!) 185/96 (!) 167/88  Pulse: 76 88  Resp: 20 16  Temp: 98.1 F (36.7 C) 98.2 F (36.8 C)  SpO2: 100% 100%  Vitals:   12/06/20 1815 12/06/20 2021 12/07/20 0454 12/07/20 0734  BP: (!) 177/93 (!) 179/99 (!) 185/96 (!) 167/88  Pulse: 80 81 76 88  Resp: 15 18 20 16   Temp:  97.7 F (36.5 C) 98.1 F (36.7 C) 98.2 F (36.8 C)  TempSrc:  Oral Oral Oral  SpO2: 99% 100% 100% 100%  Weight:  75.5 kg    Height:  5' 5"  (1.651 m)      General: Pt is alert, awake, not in acute distress Cardiovascular: RRR, S1/S2 +, no rubs, no gallops Respiratory: CTA bilaterally, no wheezing, no rhonchi Abdominal: Soft, NT, ND, bowel sounds + Extremities: no edema, no cyanosis   The results of significant diagnostics from this hospitalization  (including imaging, microbiology, ancillary and laboratory) are listed below for reference.    Microbiology: Recent Results (from the past 240 hour(s))  Resp Panel by RT-PCR (Flu A&B, Covid)     Status: None   Collection Time: 11/29/20  1:39 AM  Result Value Ref Range Status   SARS Coronavirus 2 by RT PCR NEGATIVE NEGATIVE Final    Comment: (NOTE) SARS-CoV-2 target nucleic acids are NOT DETECTED.  The SARS-CoV-2 RNA is generally detectable in upper respiratory specimens during the acute phase of infection. The lowest concentration of SARS-CoV-2 viral copies this assay can detect is 138 copies/mL. A negative result does not preclude SARS-Cov-2 infection and should not be used as the sole basis for treatment or other patient management decisions. A negative result may occur with  improper specimen collection/handling, submission of specimen other than nasopharyngeal swab, presence of viral mutation(s) within the areas targeted by this assay, and inadequate number of viral copies(<138 copies/mL). A negative result must be combined with clinical observations, patient history, and epidemiological information. The expected result is Negative.  Fact Sheet for Patients:  EntrepreneurPulse.com.au  Fact Sheet for Healthcare Providers:  IncredibleEmployment.be  This test is no t yet approved or cleared by the [place] FDA and  has been authorized for detection and/or diagnosis of SARS-CoV-2 by FDA under an Emergency Use Authorization (EUA). This EUA will remain  in effect (meaning this test can be used) for the duration of the COVID-19 declaration under Section 564(b)(1) of the Act, 21 U.S.C.section 360bbb-3(b)(1), unless the authorization is terminated  or revoked sooner.       Influenza A by PCR NEGATIVE NEGATIVE Final   Influenza B by PCR NEGATIVE NEGATIVE Final    Comment: (NOTE) The Xpert Xpress SARS-CoV-2/FLU/RSV plus assay is intended as  an aid in the diagnosis of influenza from Nasopharyngeal swab specimens and should not be used as a sole basis for treatment. Nasal washings and aspirates are unacceptable for Xpert Xpress SARS-CoV-2/FLU/RSV testing.  Fact Sheet for Patients: EntrepreneurPulse.com.au  Fact Sheet for Healthcare Providers: IncredibleEmployment.be  This test is not yet approved or cleared by the [place] FDA and has been authorized for detection and/or diagnosis of SARS-CoV-2 by FDA under an Emergency Use Authorization (EUA). This EUA will remain in effect (meaning this test can be used) for the duration of the COVID-19 declaration under Section 564(b)(1) of the Act, 21 U.S.C. section 360bbb-3(b)(1), unless the authorization is terminated or revoked.  Performed at [facility], [address] [place], [place] [address]   Resp Panel by RT-PCR (Flu A&B, Covid) Nasopharyngeal Swab     Status: None   Collection Time: 12/06/20  5:03 PM   Specimen: Nasopharyngeal Swab; Nasopharyngeal(NP) swabs in vial transport medium  Result Value Ref Range  Status   SARS Coronavirus 2 by RT PCR NEGATIVE NEGATIVE Final    Comment: (NOTE) SARS-CoV-2 target nucleic acids are NOT DETECTED.  The SARS-CoV-2 RNA is generally detectable in upper respiratory specimens during the acute phase of infection. The lowest concentration of SARS-CoV-2 viral copies this assay can detect is 138 copies/mL. A negative result does not preclude SARS-Cov-2 infection and should not be used as the sole basis for treatment or other patient management decisions. A negative result may occur with  improper specimen collection/handling, submission of specimen other than nasopharyngeal swab, presence of viral mutation(s) within the areas targeted by this assay, and inadequate number of viral copies(<138 copies/mL). A negative result must be combined with clinical observations, patient history, and  epidemiological information. The expected result is Negative.  Fact Sheet for Patients:  EntrepreneurPulse.com.au  Fact Sheet for Healthcare Providers:  IncredibleEmployment.be  This test is no t yet approved or cleared by the [place] FDA and  has been authorized for detection and/or diagnosis of SARS-CoV-2 by FDA under an Emergency Use Authorization (EUA). This EUA will remain  in effect (meaning this test can be used) for the duration of the COVID-19 declaration under Section 564(b)(1) of the Act, 21 U.S.C.section 360bbb-3(b)(1), unless the authorization is terminated  or revoked sooner.       Influenza A by PCR NEGATIVE NEGATIVE Final   Influenza B by PCR NEGATIVE NEGATIVE Final    Comment: (NOTE) The Xpert Xpress SARS-CoV-2/FLU/RSV plus assay is intended as an aid in the diagnosis of influenza from Nasopharyngeal swab specimens and should not be used as a sole basis for treatment. Nasal washings and aspirates are unacceptable for Xpert Xpress SARS-CoV-2/FLU/RSV testing.  Fact Sheet for Patients: EntrepreneurPulse.com.au  Fact Sheet for Healthcare Providers: IncredibleEmployment.be  This test is not yet approved or cleared by the [place] FDA and has been authorized for detection and/or diagnosis of SARS-CoV-2 by FDA under an Emergency Use Authorization (EUA). This EUA will remain in effect (meaning this test can be used) for the duration of the COVID-19 declaration under Section 564(b)(1) of the Act, 21 U.S.C. section 360bbb-3(b)(1), unless the authorization is terminated or revoked.  Performed at [facility], [address] [place], [place] [address]      Labs: BNP (last 3 results) No results for input(s): BNP in the last 8760 hours. Basic Metabolic Panel: Recent Labs  Lab 12/01/20 0432 12/02/20 0455 12/03/20 0621 12/04/20 0342 12/06/20 1520 12/07/20 0445  NA  137 131* 137 133* 135 134*  K 4.2 5.3* 4.2 4.1 4.1 4.8  CL 111 104 108 103 104 105  CO2 21* 21* 23 24 26 26   GLUCOSE 204* 420* 161* 245* 88 283*  BUN 34* 36* 35* 22 45* 38*  CREATININE 1.72* 1.78* 1.42* 1.16 3.12* 1.85*  CALCIUM 8.0* 7.8* 8.1* 8.0* 8.3* 8.4*  MG 1.9 1.6* 1.9  --   --   --   PHOS 2.8 2.7  --   --   --   --    Liver Function Tests: Recent Labs  Lab 12/06/20 1520  AST 41  ALT 39  ALKPHOS 46  BILITOT 0.7  PROT 7.9  ALBUMIN 2.9*   No results for input(s): LIPASE, AMYLASE in the last 168 hours. No results for input(s): AMMONIA in the last 168 hours. CBC: Recent Labs  Lab 12/01/20 0432 12/02/20 0455 12/03/20 0621 12/04/20 0342 12/06/20 1520 12/07/20 0445  WBC 1.8*  1.8* 2.3* 1.9* 1.9* 2.0* 2.3*  NEUTROABS 0.9*  --   --   --  0.7*  --   HGB 11.1*  10.9* 10.3* 9.7* 10.0* 9.1* 10.5*  HCT 32.5*  32.7* 30.8* 28.2* 29.8* 26.0* 30.5*  MCV 91.3  89.8 92.2 89.2 88.4 88.7 88.4  PLT 135*  129* 112* 110* 107* 112* 129*   Cardiac Enzymes: Recent Labs  Lab 12/06/20 2008  CKTOTAL 221   BNP: Invalid input(s): POCBNP CBG: Recent Labs  Lab 12/03/20 1230 12/03/20 1641 12/03/20 2103 12/04/20 0753 12/07/20 0730  GLUCAP 191* 105* 185* 272* 356*   D-Dimer No results for input(s): DDIMER in the last 72 hours. Hgb A1c No results for input(s): HGBA1C in the last 72 hours. Lipid Profile No results for input(s): CHOL, HDL, LDLCALC, TRIG, CHOLHDL, LDLDIRECT in the last 72 hours. Thyroid function studies No results for input(s): TSH, T4TOTAL, T3FREE, THYROIDAB in the last 72 hours.  Invalid input(s): FREET3 Anemia work up No results for input(s): VITAMINB12, FOLATE, FERRITIN, TIBC, IRON, RETICCTPCT in the last 72 hours. Urinalysis    Component Value Date/Time   COLORURINE STRAW (A) 11/28/2020 1800   APPEARANCEUR CLEAR (A) 11/28/2020 1800   LABSPEC 1.008 11/28/2020 1800   PHURINE 6.0 11/28/2020 1800   GLUCOSEU 50 (A) 11/28/2020 1800   HGBUR SMALL (A)  11/28/2020 1800   BILIRUBINUR NEGATIVE 11/28/2020 1800   KETONESUR NEGATIVE 11/28/2020 1800   PROTEINUR NEGATIVE 11/28/2020 1800   NITRITE NEGATIVE 11/28/2020 1800   LEUKOCYTESUR NEGATIVE 11/28/2020 1800   Sepsis Labs Invalid input(s): PROCALCITONIN,  WBC,  LACTICIDVEN Microbiology Recent Results (from the past 240 hour(s))  Resp Panel by RT-PCR (Flu A&B, Covid)     Status: None   Collection Time: 11/29/20  1:39 AM  Result Value Ref Range Status   SARS Coronavirus 2 by RT PCR NEGATIVE NEGATIVE Final    Comment: (NOTE) SARS-CoV-2 target nucleic acids are NOT DETECTED.  The SARS-CoV-2 RNA is generally detectable in upper respiratory specimens during the acute phase of infection. The lowest concentration of SARS-CoV-2 viral copies this assay can detect is 138 copies/mL. A negative result does not preclude SARS-Cov-2 infection and should not be used as the sole basis for treatment or other patient management decisions. A negative result may occur with  improper specimen collection/handling, submission of specimen other than nasopharyngeal swab, presence of viral mutation(s) within the areas targeted by this assay, and inadequate number of viral copies(<138 copies/mL). A negative result must be combined with clinical observations, patient history, and epidemiological information. The expected result is Negative.  Fact Sheet for Patients:  EntrepreneurPulse.com.au  Fact Sheet for Healthcare Providers:  IncredibleEmployment.be  This test is no t yet approved or cleared by the [place] FDA and  has been authorized for detection and/or diagnosis of SARS-CoV-2 by FDA under an Emergency Use Authorization (EUA). This EUA will remain  in effect (meaning this test can be used) for the duration of the COVID-19 declaration under Section 564(b)(1) of the Act, 21 U.S.C.section 360bbb-3(b)(1), unless the authorization is terminated  or revoked sooner.        Influenza A by PCR NEGATIVE NEGATIVE Final   Influenza B by PCR NEGATIVE NEGATIVE Final    Comment: (NOTE) The Xpert Xpress SARS-CoV-2/FLU/RSV plus assay is intended as an aid in the diagnosis of influenza from Nasopharyngeal swab specimens and should not be used as a sole basis for treatment. Nasal washings and aspirates are unacceptable for Xpert Xpress SARS-CoV-2/FLU/RSV testing.  Fact Sheet for Patients: EntrepreneurPulse.com.au  Fact Sheet for Healthcare Providers: IncredibleEmployment.be  This test is not yet approved or  cleared by the [place] and has been authorized for detection and/or diagnosis of SARS-CoV-2 by FDA under an Emergency Use Authorization (EUA). This EUA will remain in effect (meaning this test can be used) for the duration of the COVID-19 declaration under Section 564(b)(1) of the Act, 21 U.S.C. section 360bbb-3(b)(1), unless the authorization is terminated or revoked.  Performed at [facility], [place], [place] [address]   Resp Panel by RT-PCR (Flu A&B, Covid) Nasopharyngeal Swab     Status: None   Collection Time: 12/06/20  5:03 PM   Specimen: Nasopharyngeal Swab; Nasopharyngeal(NP) swabs in vial transport medium  Result Value Ref Range Status   SARS Coronavirus 2 by RT PCR NEGATIVE NEGATIVE Final    Comment: (NOTE) SARS-CoV-2 target nucleic acids are NOT DETECTED.  The SARS-CoV-2 RNA is generally detectable in upper respiratory specimens during the acute phase of infection. The lowest concentration of SARS-CoV-2 viral copies this assay can detect is 138 copies/mL. A negative result does not preclude SARS-Cov-2 infection and should not be used as the sole basis for treatment or other patient management decisions. A negative result may occur with  improper specimen collection/handling, submission of specimen other than nasopharyngeal swab, presence of viral mutation(s)  within the areas targeted by this assay, and inadequate number of viral copies(<138 copies/mL). A negative result must be combined with clinical observations, patient history, and epidemiological information. The expected result is Negative.  Fact Sheet for Patients:  EntrepreneurPulse.com.au  Fact Sheet for Healthcare Providers:  IncredibleEmployment.be  This test is no t yet approved or cleared by the [place] FDA and  has been authorized for detection and/or diagnosis of SARS-CoV-2 by FDA under an Emergency Use Authorization (EUA). This EUA will remain  in effect (meaning this test can be used) for the duration of the COVID-19 declaration under Section 564(b)(1) of the Act, 21 U.S.C.section 360bbb-3(b)(1), unless the authorization is terminated  or revoked sooner.       Influenza A by PCR NEGATIVE NEGATIVE Final   Influenza B by PCR NEGATIVE NEGATIVE Final    Comment: (NOTE) The Xpert Xpress SARS-CoV-2/FLU/RSV plus assay is intended as an aid in the diagnosis of influenza from Nasopharyngeal swab specimens and should not be used as a sole basis for treatment. Nasal washings and aspirates are unacceptable for Xpert Xpress SARS-CoV-2/FLU/RSV testing.  Fact Sheet for Patients: EntrepreneurPulse.com.au  Fact Sheet for Healthcare Providers: IncredibleEmployment.be  This test is not yet approved or cleared by the [place] FDA and has been authorized for detection and/or diagnosis of SARS-CoV-2 by FDA under an Emergency Use Authorization (EUA). This EUA will remain in effect (meaning this test can be used) for the duration of the COVID-19 declaration under Section 564(b)(1) of the Act, 21 U.S.C. section 360bbb-3(b)(1), unless the authorization is terminated or revoked.  Performed at [facility], [address] [place], [place] [address]     Time coordinating discharge: Over 30  minutes  SIGNED:  Lorella Nimrod, MD  [facility] 12/07/2020, 9:40 AM  If 7PM-7AM, please contact night-coverage [email or web address]  This record has been created using Systems analyst. Errors have been sought and corrected,but may not always be located. Such creation errors do not reflect on the standard of care.

## 2020-12-09 DIAGNOSIS — F1911 Other psychoactive substance abuse, in remission: Secondary | ICD-10-CM | POA: Insufficient documentation

## 2020-12-10 ENCOUNTER — Inpatient Hospital Stay: Payer: Medicare (Managed Care) | Admitting: Oncology

## 2020-12-10 ENCOUNTER — Inpatient Hospital Stay: Payer: Medicare (Managed Care)

## 2020-12-10 ENCOUNTER — Other Ambulatory Visit: Payer: Self-pay | Admitting: Nephrology

## 2020-12-10 DIAGNOSIS — E1122 Type 2 diabetes mellitus with diabetic chronic kidney disease: Secondary | ICD-10-CM

## 2020-12-10 DIAGNOSIS — N1832 Chronic kidney disease, stage 3b: Secondary | ICD-10-CM

## 2020-12-17 ENCOUNTER — Encounter: Payer: Self-pay | Admitting: Oncology

## 2020-12-17 ENCOUNTER — Inpatient Hospital Stay (HOSPITAL_BASED_OUTPATIENT_CLINIC_OR_DEPARTMENT_OTHER): Payer: Medicare (Managed Care) | Admitting: Oncology

## 2020-12-17 ENCOUNTER — Inpatient Hospital Stay: Payer: Medicare (Managed Care)

## 2020-12-17 ENCOUNTER — Inpatient Hospital Stay: Payer: Medicare (Managed Care) | Attending: Oncology

## 2020-12-17 VITALS — BP 149/86 | HR 73 | Temp 96.3°F | Resp 16 | Wt 170.7 lb

## 2020-12-17 DIAGNOSIS — N189 Chronic kidney disease, unspecified: Secondary | ICD-10-CM

## 2020-12-17 DIAGNOSIS — Z862 Personal history of diseases of the blood and blood-forming organs and certain disorders involving the immune mechanism: Secondary | ICD-10-CM | POA: Diagnosis not present

## 2020-12-17 DIAGNOSIS — D631 Anemia in chronic kidney disease: Secondary | ICD-10-CM | POA: Diagnosis present

## 2020-12-17 DIAGNOSIS — N183 Chronic kidney disease, stage 3 unspecified: Secondary | ICD-10-CM

## 2020-12-17 DIAGNOSIS — C9000 Multiple myeloma not having achieved remission: Secondary | ICD-10-CM

## 2020-12-17 DIAGNOSIS — D649 Anemia, unspecified: Secondary | ICD-10-CM | POA: Diagnosis not present

## 2020-12-17 DIAGNOSIS — D472 Monoclonal gammopathy: Secondary | ICD-10-CM

## 2020-12-17 DIAGNOSIS — Z79899 Other long term (current) drug therapy: Secondary | ICD-10-CM

## 2020-12-17 LAB — COMPREHENSIVE METABOLIC PANEL
ALT: 13 U/L (ref 0–44)
AST: 15 U/L (ref 15–41)
Albumin: 3.1 g/dL — ABNORMAL LOW (ref 3.5–5.0)
Alkaline Phosphatase: 45 U/L (ref 38–126)
Anion gap: 3 — ABNORMAL LOW (ref 5–15)
BUN: 33 mg/dL — ABNORMAL HIGH (ref 8–23)
CO2: 25 mmol/L (ref 22–32)
Calcium: 8.2 mg/dL — ABNORMAL LOW (ref 8.9–10.3)
Chloride: 108 mmol/L (ref 98–111)
Creatinine, Ser: 1.49 mg/dL — ABNORMAL HIGH (ref 0.61–1.24)
GFR, Estimated: 51 mL/min — ABNORMAL LOW (ref >60)
Glucose, Bld: 190 mg/dL — ABNORMAL HIGH (ref 70–99)
Potassium: 4.4 mmol/L (ref 3.5–5.1)
Sodium: 136 mmol/L (ref 135–145)
Total Bilirubin: 0.6 mg/dL (ref 0.3–1.2)
Total Protein: 8.2 g/dL — ABNORMAL HIGH (ref 6.5–8.1)

## 2020-12-17 LAB — CBC WITH DIFFERENTIAL/PLATELET
Abs Immature Granulocytes: 0.01 10*3/uL (ref 0.00–0.07)
Basophils Absolute: 0 10*3/uL (ref 0.0–0.1)
Basophils Relative: 0 %
Eosinophils Absolute: 0 10*3/uL (ref 0.0–0.5)
Eosinophils Relative: 1 %
HCT: 26.8 % — ABNORMAL LOW (ref 39.0–52.0)
Hemoglobin: 8.8 g/dL — ABNORMAL LOW (ref 13.0–17.0)
Immature Granulocytes: 0 %
Lymphocytes Relative: 30 %
Lymphs Abs: 0.7 10*3/uL (ref 0.7–4.0)
MCH: 30.1 pg (ref 26.0–34.0)
MCHC: 32.8 g/dL (ref 30.0–36.0)
MCV: 91.8 fL (ref 80.0–100.0)
Monocytes Absolute: 0.3 10*3/uL (ref 0.1–1.0)
Monocytes Relative: 12 %
Neutro Abs: 1.4 10*3/uL — ABNORMAL LOW (ref 1.7–7.7)
Neutrophils Relative %: 57 %
Platelets: 182 10*3/uL (ref 150–400)
RBC: 2.92 MIL/uL — ABNORMAL LOW (ref 4.22–5.81)
RDW: 14.2 % (ref 11.5–15.5)
WBC: 2.5 10*3/uL — ABNORMAL LOW (ref 4.0–10.5)
nRBC: 0 % (ref 0.0–0.2)

## 2020-12-17 MED ORDER — EPOETIN ALFA-EPBX 40000 UNIT/ML IJ SOLN
40000.0000 [IU] | Freq: Once | INTRAMUSCULAR | Status: AC
  Administered 2020-12-17: 40000 [IU] via SUBCUTANEOUS
  Filled 2020-12-17: qty 1

## 2020-12-17 NOTE — Progress Notes (Signed)
Pt in for follow up, denies any concerns today. 

## 2020-12-17 NOTE — Progress Notes (Signed)
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 Patient's creatinine has been fluctuating between 1.5-2 but about 5 months ago it went up all the way to 5 and his blood sugars were in the 1000 range. More recently his kidney numbers have been drifting back to normal values. As a part of the work-up he had serum protein electrophoresis done which showed an elevated gammaglobulin fraction with an M spike of 3%. The amount of M spike was not quantified in the specimen. He has therefore been referred to [place] for further management. Patient endorses chronic fatigue reports that his appetite and weight have remained stable   Results of blood work from 04/08/2020 were as follows: CBC showed white count of 2.9, H&H of 10.5/31.2 with an MCV of 91 and a platelet count of 191.  CMP showed a mildly elevated creatinine of 1.2 which was better as compared to 5 months ago when it was 3.9.  Total protein was mildly elevated at 9.1 calcium normal at 8.4 ferritin and iron studies B12 folate TSH and haptoglobin were normal.  Myeloma panel revealed an elevated IgG level of 06/04/2004 with an M protein of 3.1 g.  Immunofixation showed IgG lambda  specificity.  Serum free light chain ratio was elevated at 25 and free light chain lambda elevated at 370   Further myeloma work-up including a PET CT scan did not reveal any evidence of edematous lesions.  Bone marrow biopsy showed 23% plasma cells by manual count and 30% by CD138 IHC.  Normal cytogenetics.  FISH studies for myeloma showed gain of 1 q.  13 q-. detected.  P53 not detected.  Interval history-states that he has not used any cocaine for the last 3 weeks.  He uses marijuana.  Recent alcohol levels were also undetectable in serum.  ECOG PS- 1 Pain scale- 0   Review of systems- Review of Systems  Constitutional:  Positive for malaise/fatigue. Negative for chills, fever and weight loss.  HENT:  Negative for congestion, ear discharge and nosebleeds.   Eyes:  Negative for blurred vision.  Respiratory:  Negative for cough, hemoptysis, sputum production, shortness of breath and wheezing.   Cardiovascular:  Negative for chest pain, palpitations, orthopnea and claudication.  Gastrointestinal:  Negative for abdominal pain, blood in stool, constipation, diarrhea, heartburn, melena, nausea and vomiting.  Genitourinary:  Negative for dysuria, flank pain, frequency, hematuria and urgency.  Musculoskeletal:  Negative for back pain, joint pain and myalgias.  Skin:  Negative for rash.  Neurological:  Negative for dizziness, tingling, focal weakness, seizures, weakness and headaches.  Endo/Heme/Allergies:  Does not bruise/bleed easily.  Psychiatric/Behavioral:  Negative for depression and suicidal ideas. The patient does not have insomnia.      Allergies  Allergen Reactions   Penicillins Other (See Comments)    Has patient had a PCN reaction  causing immediate rash, facial/tongue/throat swelling, SOB or lightheadedness with hypotension: No Has patient had a PCN reaction causing severe rash involving mucus membranes or skin necrosis: No Has patient had a PCN reaction that required  hospitalization No Has patient had a PCN reaction occurring within the last 10 years: No If all of the above answers are "NO", then may proceed with Cephalosporin use.      Past Medical History:  Diagnosis Date   DKA (diabetic ketoacidoses) 04/06/2016   Hypercholesteremia    Hypertension      Past Surgical History:  Procedure Laterality Date   COLONOSCOPY WITH PROPOFOL N/A 02/01/2020   Procedure: COLONOSCOPY WITH PROPOFOL;  Surgeon: Lin Landsman, MD;  Location: [facility] ENDOSCOPY;  Service: Gastroenterology;  Laterality: N/A;   ESOPHAGOGASTRODUODENOSCOPY  02/01/2020   Procedure: ESOPHAGOGASTRODUODENOSCOPY (EGD);  Surgeon: Lin Landsman, MD;  Location: [facility] ENDOSCOPY;  Service: Gastroenterology;;   KNEE SURGERY Right    Torn meniscus   KNEE SURGERY Left     Social History   Socioeconomic History   Marital status: Single    Spouse name: Not on file   Number of children: 3   Years of education: Not on file   Highest education level: High school graduate  Occupational History    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  Tobacco Use   Smoking status: Never   Smokeless tobacco: Never  Vaping Use   Vaping Use: Never used  Substance and Sexual Activity   Alcohol use: Yes    Alcohol/week: 0.0 - 1.0 standard drinks    Comment: "once every 2 months"   Drug use: Not Currently    Types: Marijuana    Comment: "none in a long time'   Sexual activity: Yes    Birth control/protection: None  Other Topics Concern   Not on file  Social History Narrative   Not on file   Social Determinants of Health   Financial Resource Strain: Low Risk    Difficulty of Paying Living Expenses: Not hard at all  Food Insecurity: No Food Insecurity   Worried About Charity fundraiser in the Last Year: Never true   Ran Out of Food in the Last Year: Never true  Transportation Needs: No Transportation Needs   Lack of Transportation (Medical): No   Lack of Transportation (Non-Medical): No  Physical  Activity: Insufficiently Active   Days of Exercise per Week: 7 days   Minutes of Exercise per Session: 20 min  Stress: No Stress Concern Present   Feeling of Stress : Not at all  Social Connections: Moderately Isolated   Frequency of Communication with Friends and Family: More than three times a week   Frequency of Social Gatherings with Friends and Family: More than three times a week   Attends Religious Services: Never   Marine scientist or Organizations: No   Attends Music therapist: Never   Marital Status: Living with partner  Intimate Partner Violence: Not At Risk   Fear of Current or Ex-Partner: No   Emotionally Abused: No   Physically Abused: No   Sexually Abused: No    Family History  Problem Relation Age of Onset   Heart attack Father    Hypertension Sister    Cancer Sister      Current Outpatient Medications:    Alcohol Swabs (B-D SINGLE USE SWABS REGULAR) PADS, Use to check blood sugar four times daily for type 1 diabetes, Disp: 300 each, Rfl: 4   amLODipine (NORVASC)  10 MG tablet, Take 1 tablet (10 mg total) by mouth daily., Disp: 30 tablet, Rfl: 0   Blood Glucose Calibration (TRUE METRIX LEVEL 1) Low SOLN, Use to check blood sugar four times daily for type 1 diabetes, Disp: 1 each, Rfl: 2   insulin aspart (NOVOLOG) 100 UNIT/ML injection, Inject up to 14 units three times daily before meals according to sliding scale, Disp: 30 mL, Rfl: 4   insulin degludec (TRESIBA FLEXTOUCH) 100 UNIT/ML FlexTouch Pen, 15 units daily. (Patient taking differently: Inject 15 Units into the skin daily. 15 units daily.), Disp: 30 mL, Rfl: 3   Insulin Syringe-Needle U-100 (INSULIN SYRINGE 1CC/31GX5/16") 31G X 5/16" 1 ML MISC, For insulin injections up to 4 times daily, Disp: 270 each, Rfl: 4   lisinopril (ZESTRIL) 2.5 MG tablet, Take 2.5 mg by mouth daily., Disp: , Rfl:    lovastatin (MEVACOR) 40 MG tablet, Take 40 mg by mouth at bedtime., Disp: , Rfl:    pregabalin  (LYRICA) 75 MG capsule, Take 1 capsule by mouth twice daily, Disp: 180 capsule, Rfl: 1   aspirin 81 MG chewable tablet, Chew 1 tablet by mouth daily. (Patient not taking: Reported on 12/17/2020), Disp: , Rfl:   Physical exam:  Vitals:   12/17/20 1023  BP: (!) 149/86  Pulse: 73  Resp: 16  Temp: (!) 96.3 F (35.7 C)  TempSrc: Tympanic  SpO2: 100%  Weight: 170 lb 11.2 oz (77.4 kg)   Physical Exam Cardiovascular:     Rate and Rhythm: Normal rate and regular rhythm.     Heart sounds: Normal heart sounds.  Pulmonary:     Effort: Pulmonary effort is normal.     Breath sounds: Normal breath sounds.  Musculoskeletal:     Comments: Trace bilateral edema  Skin:    General: Skin is warm and dry.  Neurological:     Mental Status: He is alert and oriented to person, place, and time.     CMP Latest Ref Rng & Units 12/17/2020  Glucose 70 - 99 mg/dL 190(H)  BUN 8 - 23 mg/dL 33(H)  Creatinine 0.61 - 1.24 mg/dL 1.49(H)  Sodium 135 - 145 mmol/L 136  Potassium 3.5 - 5.1 mmol/L 4.4  Chloride 98 - 111 mmol/L 108  CO2 22 - 32 mmol/L 25  Calcium 8.9 - 10.3 mg/dL 8.2(L)  Total Protein 6.5 - 8.1 g/dL 8.2(H)  Total Bilirubin 0.3 - 1.2 mg/dL 0.6  Alkaline Phos 38 - 126 U/L 45  AST 15 - 41 U/L 15  ALT 0 - 44 U/L 13   CBC Latest Ref Rng & Units 12/17/2020  WBC 4.0 - 10.5 K/uL 2.5(L)  Hemoglobin 13.0 - 17.0 g/dL 8.8(L)  Hematocrit 39.0 - 52.0 % 26.8(L)  Platelets 150 - 400 K/uL 182    No images are attached to the encounter.  CT HEAD WO CONTRAST (5MM)  Result Date: 11/29/2020 CLINICAL DATA:  Head trauma.  Hypoglycemia and involuntary movement. EXAM: CT HEAD WITHOUT CONTRAST TECHNIQUE: Contiguous axial images were obtained from the base of the skull through the vertex without intravenous contrast. COMPARISON:  Head CT 11/28/2020 and MRI 09/12/2020 FINDINGS: Brain: There is no evidence of an acute infarct, intracranial hemorrhage, mass, midline shift, or extra-axial fluid collection. The ventricles  and sulci are normal. Hypodensities in the cerebral white matter are unchanged and nonspecific but compatible with mild chronic small vessel ischemic disease. Prominent dural calcifications are again noted along the falx and tentorium. Vascular: Calcified atherosclerosis at the skull base. No  hyperdense vessel. Skull: No fracture or suspicious osseous lesion. Sinuses/Orbits: Moderate right ethmoid air cell opacification, slightly increased from yesterday's CT. Clear mastoid air cells. Unremarkable orbits. Other: None. IMPRESSION: 1. No evidence of acute intracranial abnormality. 2. Mild chronic small vessel ischemic disease. Electronically Signed   By: Logan Bores M.D.   On: 11/29/2020 12:25   CT HEAD WO CONTRAST (5MM)  Result Date: 11/28/2020 CLINICAL DATA:  Hypoglycemia. EXAM: CT HEAD WITHOUT CONTRAST TECHNIQUE: Contiguous axial images were obtained from the base of the skull through the vertex without intravenous contrast. COMPARISON:  September 12, 2020 FINDINGS: Brain: No evidence of acute infarction, hemorrhage, hydrocephalus, extra-axial collection or mass lesion/mass effect. Stable areas of calcification are seen throughout the falx and tentorium. Vascular: No hyperdense vessel or unexpected calcification. Skull: Normal. Negative for fracture or focal lesion. Sinuses/Orbits: There is moderate severity right ethmoid sinus mucosal thickening. Other: None. IMPRESSION: 1. No acute intracranial pathology. Electronically Signed   By: Virgina Norfolk M.D.   On: 11/28/2020 21:17   CT Cervical Spine Wo Contrast  Result Date: 11/28/2020 CLINICAL DATA:  Hypoglycemia. EXAM: CT CERVICAL SPINE WITHOUT CONTRAST TECHNIQUE: Multidetector CT imaging of the cervical spine was performed without intravenous contrast. Multiplanar CT image reconstructions were also generated. COMPARISON:  None. FINDINGS: Alignment: Normal. Skull base and vertebrae: No acute fracture. No primary bone lesion or focal pathologic process. Soft  tissues and spinal canal: No prevertebral fluid or swelling. No visible canal hematoma. Disc levels: Mild endplate sclerosis and mild to moderate severity anterior osteophyte formation is seen at the levels of C5-C6 and C6-C7. Mild intervertebral disc space narrowing is seen at the levels of C5-C6 and C6-C7. Mild, bilateral multilevel facet joint hypertrophy is noted. Upper chest: Negative. Other: None. IMPRESSION: 1. No acute fracture or subluxation of the cervical spine. 2. Mild to moderate severity degenerative changes at the levels of C5-C6 and C6-C7. Electronically Signed   By: Virgina Norfolk M.D.   On: 11/28/2020 21:25   US RENAL  Result Date: 12/01/2020 CLINICAL DATA:  Acute kidney injury. EXAM: RENAL / URINARY TRACT ULTRASOUND COMPLETE COMPARISON:  Renal ultrasound 08/02/2020 FINDINGS: Right Kidney: Renal measurements: 9.5 x 4.7 x 4.6 cm = volume: 108 mL. Borderline increased parenchymal echogenicity. No hydronephrosis. No visualized stone or focal lesion. Left Kidney: Renal measurements: 8.3 x 4.6 x 4.0 cm = volume: 81 mL. Limited visualization due to overlying bowel gas and rib shadowing. No hydronephrosis. More detailed assessment is limited. Bladder: Appears normal for degree of bladder distention. Other: None. IMPRESSION: 1. No obstructive uropathy. 2. Limited evaluation of the left kidney due to overlying bowel gas and rib shadowing. 3. Suggestion of mild increased right renal parenchymal echogenicity suggesting chronic medical renal disease. Electronically Signed   By: Keith Rake M.D.   On: 12/01/2020 18:30   DG Chest Port 1 View  Result Date: 12/06/2020 CLINICAL DATA:  Weakness short of breath EXAM: PORTABLE CHEST 1 VIEW COMPARISON:  08/01/2020 FINDINGS: The heart size and mediastinal contours are within normal limits. Aortic atherosclerosis. Both lungs are clear. The visualized skeletal structures are unremarkable. IMPRESSION: No active disease. Electronically Signed   By: Donavan Foil M.D.   On: 12/06/2020 18:40   EEG adult  Result Date: 11/29/2020 Greta Doom, MD     11/29/2020  4:04 PM History: 70 yo M with AMS Sedation: None Technique: This EEG was acquired with electrodes placed according to the International 10-20 electrode system (including Fp1, Fp2, F3, F4, C3, C4, P3,  P4, O1, O2, T3, T4, T5, T6, A1, A2, Fz, Cz, Pz). The following electrodes were missing or displaced: none. Background: The background is relatively low voltage but there is a posterior dominant rhythm of 9 Hz that attenuates with eye opening. In addition, there is generalized irregular delta and theta range intrusion into the background. Photic stimulation: Physiologic driving is not performed EEG Abnormalities: 1) Generalized irregular slow activity. Clinical Interpretation: This EEG is consistent with a mild generalized non-specific cerebral dysfunction(encephalopathy). There was no seizure or seizure predisposition recorded on this study. Please note that lack of epileptiform activity on EEG does not preclude the possibility of epilepsy. Roland Rack, MD [facility] [phone] If 7pm- 7am, please page neurology on call as listed in [place].     Assessment and plan- Patient is a 70 y.o. male who is here for follow-up of following issues:  Smoldering multiple myeloma: Patient has chronic CKD likely secondary to uncontrolled diabetes and his kidney functions fluctuate greatly between 1.2-5.  Presently they are better at 1.4.  CKD likely secondary to diabetes since there is no acute worsening of his kidney functions to suggest myeloma.  Anemia has remained overall stable around 10 with the use of Retacrit.  He will receive his Retacrit today as well.  Myeloma panel and serum free light chains are presently pending and we have been monitoring that every 3 months.  Serum free light chain ratio remained stable around 25.  Myeloma panel does reveal an elevated IgG of 4096 with an M  protein of 3.0.  Based on today's level of there is a consistent increase in his M protein or free light chain ratio I will have a low threshold to treat him as an overt multiple myeloma.  He did have a PET scan in February 2022 which did not show any myelomatous lytic lesions.  No evidence of hypercalcemia. 2.  Neutropenia: Remains overall stable with a neutrophil count that fluctuates between [phone].  He does intermittently use cocaine as well which could be one of the etiologies. 3.  Anemia of CKD: Continue Retacrit  Labs in 3 in 6 months and I will see him back in 6 months repeat bone survey in the next 1 to 2 months   Visit Diagnosis 1. History of anemia due to chronic kidney disease   2. Erythropoietin (EPO) stimulating agent anemia management patient   3. Smoldering multiple myeloma ([place])      Dr. Randa Evens, MD, MPH [facility] [phone] 12/17/2020 4:13 PM

## 2020-12-18 LAB — KAPPA/LAMBDA LIGHT CHAINS
Kappa free light chain: 12.3 mg/L (ref 3.3–19.4)
Kappa, lambda light chain ratio: 0.04 — ABNORMAL LOW (ref 0.26–1.65)
Lambda free light chains: 275.1 mg/L — ABNORMAL HIGH (ref 5.7–26.3)

## 2020-12-20 ENCOUNTER — Ambulatory Visit: Payer: Medicare (Managed Care)

## 2020-12-20 LAB — MULTIPLE MYELOMA PANEL, SERUM
Albumin SerPl Elph-Mcnc: 3.1 g/dL (ref 2.9–4.4)
Albumin/Glob SerPl: 0.7 (ref 0.7–1.7)
Alpha 1: 0.2 g/dL (ref 0.0–0.4)
Alpha2 Glob SerPl Elph-Mcnc: 0.6 g/dL (ref 0.4–1.0)
B-Globulin SerPl Elph-Mcnc: 0.6 g/dL — ABNORMAL LOW (ref 0.7–1.3)
Gamma Glob SerPl Elph-Mcnc: 3.1 g/dL — ABNORMAL HIGH (ref 0.4–1.8)
Globulin, Total: 4.5 g/dL — ABNORMAL HIGH (ref 2.2–3.9)
IgA: 20 mg/dL — ABNORMAL LOW (ref 61–437)
IgG (Immunoglobin G), Serum: 3940 mg/dL — ABNORMAL HIGH (ref 603–1613)
IgM (Immunoglobulin M), Srm: <5 mg/dL — ABNORMAL LOW (ref 20–172)
M Protein SerPl Elph-Mcnc: 2.6 g/dL — ABNORMAL HIGH
Total Protein ELP: 7.6 g/dL (ref 6.0–8.5)

## 2020-12-28 ENCOUNTER — Other Ambulatory Visit: Payer: Self-pay | Admitting: Family Medicine

## 2020-12-28 DIAGNOSIS — E1040 Type 1 diabetes mellitus with diabetic neuropathy, unspecified: Secondary | ICD-10-CM

## 2020-12-28 NOTE — Telephone Encounter (Signed)
No longer under prescriber's care- now goes to [facility] effective 12/05/20

## 2021-01-06 ENCOUNTER — Ambulatory Visit: Payer: Medicare HMO

## 2021-01-07 ENCOUNTER — Inpatient Hospital Stay: Payer: Medicare (Managed Care) | Attending: Oncology

## 2021-01-07 ENCOUNTER — Inpatient Hospital Stay: Payer: Medicare (Managed Care)

## 2021-01-07 VITALS — BP 144/80 | HR 83

## 2021-01-07 DIAGNOSIS — N189 Chronic kidney disease, unspecified: Secondary | ICD-10-CM | POA: Insufficient documentation

## 2021-01-07 DIAGNOSIS — D631 Anemia in chronic kidney disease: Secondary | ICD-10-CM | POA: Insufficient documentation

## 2021-01-07 DIAGNOSIS — N183 Chronic kidney disease, stage 3 unspecified: Secondary | ICD-10-CM

## 2021-01-07 DIAGNOSIS — D472 Monoclonal gammopathy: Secondary | ICD-10-CM

## 2021-01-07 LAB — HEMOGLOBIN AND HEMATOCRIT, BLOOD
HCT: 27.8 % — ABNORMAL LOW (ref 39.0–52.0)
Hemoglobin: 9 g/dL — ABNORMAL LOW (ref 13.0–17.0)

## 2021-01-07 MED ORDER — EPOETIN ALFA-EPBX 40000 UNIT/ML IJ SOLN
40000.0000 [IU] | Freq: Once | INTRAMUSCULAR | Status: AC
  Administered 2021-01-07: 40000 [IU] via SUBCUTANEOUS
  Filled 2021-01-07: qty 1

## 2021-01-28 ENCOUNTER — Other Ambulatory Visit: Payer: Self-pay

## 2021-01-28 ENCOUNTER — Inpatient Hospital Stay: Payer: Medicare (Managed Care) | Attending: Oncology

## 2021-01-28 ENCOUNTER — Inpatient Hospital Stay: Payer: Medicare (Managed Care)

## 2021-01-28 VITALS — BP 154/83 | HR 82

## 2021-01-28 DIAGNOSIS — D631 Anemia in chronic kidney disease: Secondary | ICD-10-CM | POA: Insufficient documentation

## 2021-01-28 DIAGNOSIS — C9000 Multiple myeloma not having achieved remission: Secondary | ICD-10-CM | POA: Diagnosis not present

## 2021-01-28 DIAGNOSIS — D472 Monoclonal gammopathy: Secondary | ICD-10-CM

## 2021-01-28 DIAGNOSIS — N1832 Chronic kidney disease, stage 3b: Secondary | ICD-10-CM | POA: Diagnosis present

## 2021-01-28 DIAGNOSIS — N183 Chronic kidney disease, stage 3 unspecified: Secondary | ICD-10-CM

## 2021-01-28 DIAGNOSIS — N189 Chronic kidney disease, unspecified: Secondary | ICD-10-CM | POA: Insufficient documentation

## 2021-01-28 LAB — HEMOGLOBIN AND HEMATOCRIT, BLOOD
HCT: 29.5 % — ABNORMAL LOW (ref 39.0–52.0)
Hemoglobin: 9.6 g/dL — ABNORMAL LOW (ref 13.0–17.0)

## 2021-01-28 MED ORDER — EPOETIN ALFA-EPBX 40000 UNIT/ML IJ SOLN
40000.0000 [IU] | Freq: Once | INTRAMUSCULAR | Status: AC
  Administered 2021-01-28: 40000 [IU] via SUBCUTANEOUS
  Filled 2021-01-28: qty 1

## 2021-02-18 ENCOUNTER — Inpatient Hospital Stay: Payer: Medicare (Managed Care)

## 2021-02-27 ENCOUNTER — Emergency Department
Admission: EM | Admit: 2021-02-27 | Discharge: 2021-02-27 | Disposition: A | Discharge status: Home / Self Care | Payer: Medicare (Managed Care) | Attending: Student in an Organized Health Care Education/Training Program | Admitting: Student in an Organized Health Care Education/Training Program

## 2021-02-27 ENCOUNTER — Emergency Department: Payer: Medicare (Managed Care)

## 2021-02-27 ENCOUNTER — Other Ambulatory Visit: Payer: Self-pay

## 2021-02-27 DIAGNOSIS — Z79899 Other long term (current) drug therapy: Secondary | ICD-10-CM | POA: Diagnosis not present

## 2021-02-27 DIAGNOSIS — N1831 Chronic kidney disease, stage 3a: Secondary | ICD-10-CM | POA: Insufficient documentation

## 2021-02-27 DIAGNOSIS — Z20822 Contact with and (suspected) exposure to covid-19: Secondary | ICD-10-CM | POA: Diagnosis not present

## 2021-02-27 DIAGNOSIS — E1122 Type 2 diabetes mellitus with diabetic chronic kidney disease: Secondary | ICD-10-CM | POA: Diagnosis not present

## 2021-02-27 DIAGNOSIS — G253 Myoclonus: Secondary | ICD-10-CM | POA: Diagnosis not present

## 2021-02-27 DIAGNOSIS — Z7982 Long term (current) use of aspirin: Secondary | ICD-10-CM | POA: Insufficient documentation

## 2021-02-27 DIAGNOSIS — E1140 Type 2 diabetes mellitus with diabetic neuropathy, unspecified: Secondary | ICD-10-CM | POA: Insufficient documentation

## 2021-02-27 DIAGNOSIS — I129 Hypertensive chronic kidney disease with stage 1 through stage 4 chronic kidney disease, or unspecified chronic kidney disease: Secondary | ICD-10-CM | POA: Insufficient documentation

## 2021-02-27 DIAGNOSIS — Z794 Long term (current) use of insulin: Secondary | ICD-10-CM | POA: Insufficient documentation

## 2021-02-27 DIAGNOSIS — E11649 Type 2 diabetes mellitus with hypoglycemia without coma: Secondary | ICD-10-CM | POA: Insufficient documentation

## 2021-02-27 LAB — URINALYSIS, COMPLETE (UACMP) WITH MICROSCOPIC
Bacteria, UA: NONE SEEN
Bilirubin Urine: NEGATIVE
Glucose, UA: >1000 mg/dL — AB
Ketones, ur: NEGATIVE mg/dL
Leukocytes,Ua: NEGATIVE
Nitrite: NEGATIVE
Protein, ur: NEGATIVE mg/dL
RBC / HPF: NONE SEEN RBC/hpf (ref 0–5)
Specific Gravity, Urine: 1.01 (ref 1.005–1.030)
Squamous Epithelial / HPF: NONE SEEN (ref 0–5)
WBC, UA: NONE SEEN WBC/hpf (ref 0–5)
pH: 5.5 (ref 5.0–8.0)

## 2021-02-27 LAB — CBC
HCT: 30.8 % — ABNORMAL LOW (ref 39.0–52.0)
Hemoglobin: 9.9 g/dL — ABNORMAL LOW (ref 13.0–17.0)
MCH: 30.6 pg (ref 26.0–34.0)
MCHC: 32.1 g/dL (ref 30.0–36.0)
MCV: 95.1 fL (ref 80.0–100.0)
Platelets: 189 10*3/uL (ref 150–400)
RBC: 3.24 MIL/uL — ABNORMAL LOW (ref 4.22–5.81)
RDW: 14.9 % (ref 11.5–15.5)
WBC: 2.5 10*3/uL — ABNORMAL LOW (ref 4.0–10.5)
nRBC: 0 % (ref 0.0–0.2)

## 2021-02-27 LAB — URINE DRUG SCREEN, QUALITATIVE (ARMC ONLY)
Amphetamines, Ur Screen: NOT DETECTED
Barbiturates, Ur Screen: NOT DETECTED
Benzodiazepine, Ur Scrn: NOT DETECTED
Cannabinoid 50 Ng, Ur ~~LOC~~: POSITIVE — AB
Cocaine Metabolite,Ur ~~LOC~~: NOT DETECTED
MDMA (Ecstasy)Ur Screen: NOT DETECTED
Methadone Scn, Ur: NOT DETECTED
Opiate, Ur Screen: NOT DETECTED
Phencyclidine (PCP) Ur S: NOT DETECTED
Tricyclic, Ur Screen: NOT DETECTED

## 2021-02-27 LAB — CBG MONITORING, ED
Glucose-Capillary: 173 mg/dL — ABNORMAL HIGH (ref 70–99)
Glucose-Capillary: 157 mg/dL — ABNORMAL HIGH (ref 70–99)

## 2021-02-27 LAB — BASIC METABOLIC PANEL
Anion gap: 3 — ABNORMAL LOW (ref 5–15)
BUN: 35 mg/dL — ABNORMAL HIGH (ref 8–23)
CO2: 26 mmol/L (ref 22–32)
Calcium: 8.1 mg/dL — ABNORMAL LOW (ref 8.9–10.3)
Chloride: 103 mmol/L (ref 98–111)
Creatinine, Ser: 1.74 mg/dL — ABNORMAL HIGH (ref 0.61–1.24)
GFR, Estimated: 42 mL/min — ABNORMAL LOW (ref >60)
Glucose, Bld: 162 mg/dL — ABNORMAL HIGH (ref 70–99)
Potassium: 4 mmol/L (ref 3.5–5.1)
Sodium: 132 mmol/L — ABNORMAL LOW (ref 135–145)

## 2021-02-27 LAB — RESP PANEL BY RT-PCR (FLU A&B, COVID) ARPGX2
Influenza A by PCR: NEGATIVE
Influenza B by PCR: NEGATIVE
SARS Coronavirus 2 by RT PCR: NEGATIVE

## 2021-02-27 IMAGING — CT CT HEAD W/O CM
4 series · 16 of 47 positions shown, 18 images · non-contrast
Comparison: CT head [DATE]

CLINICAL DATA: Head trauma

EXAM:
CT HEAD WITHOUT CONTRAST
TECHNIQUE: Contiguous axial images were obtained from the base of the skull
through the vertex without intravenous contrast.

[Series 2: head bone · axial · 0.43mm/px · z∈[+439,+469]mm · 3 of 77 slices shown]
[im 8/77  bone]
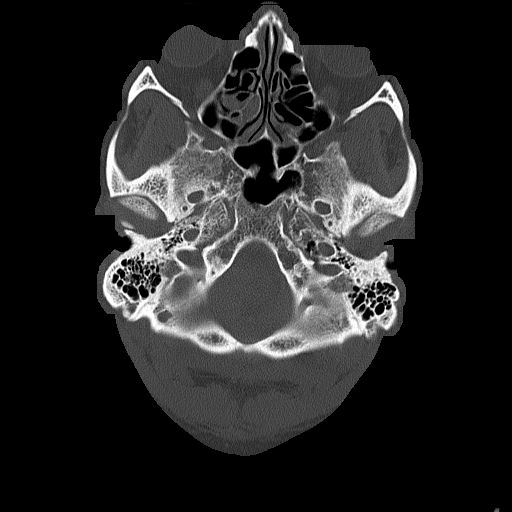
[im 16/77  bone]
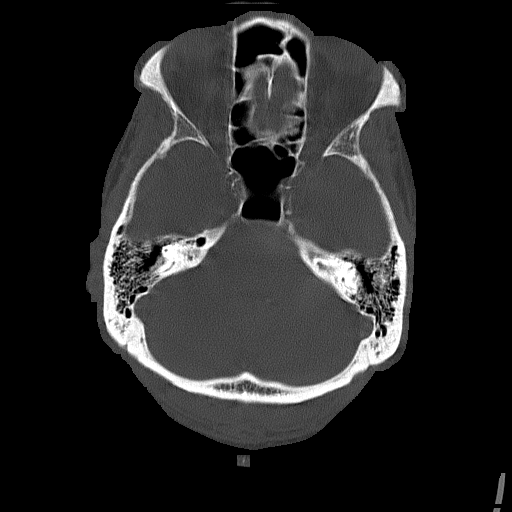
[im 23/77  bone]
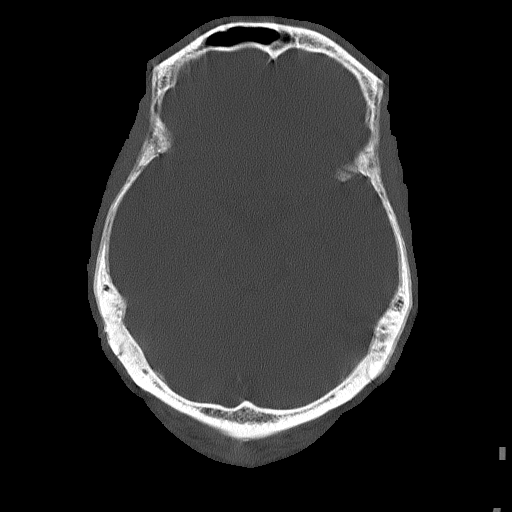

[Series 3: head wo · axial · 0.43mm/px · z∈[+440,+555]mm · 7 of 31 slices shown, 9 images]
[im 4/31  brain]
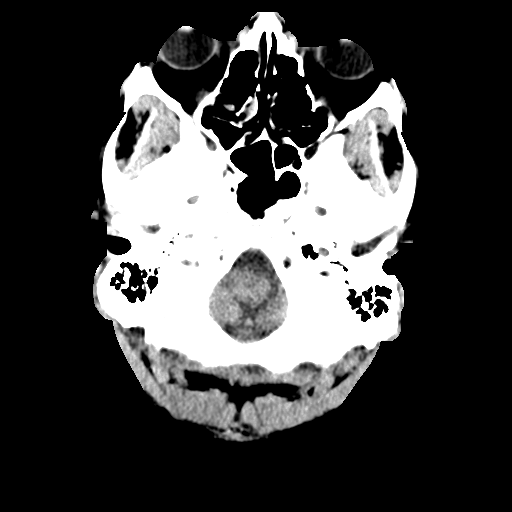
[im 4/31  bone]
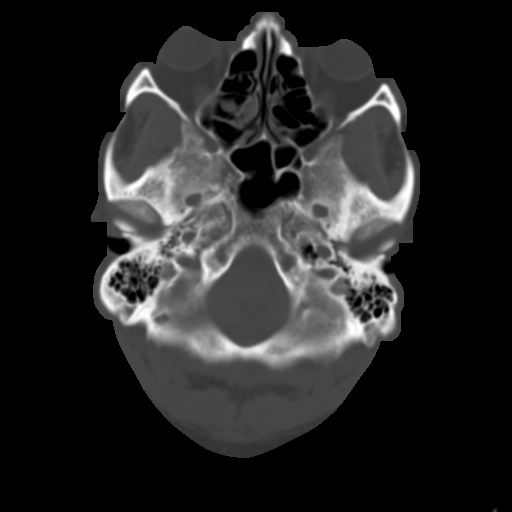
[im 8/31  brain]
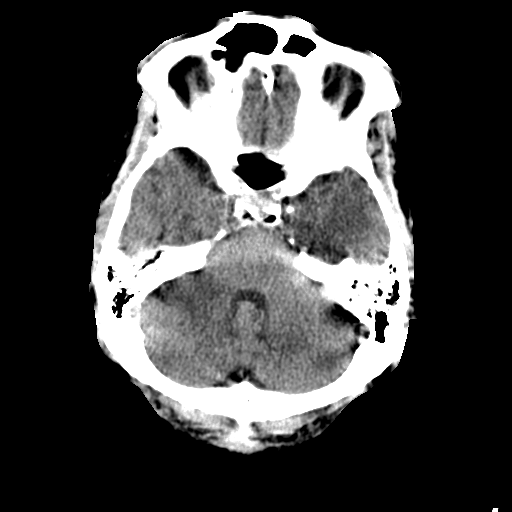
[im 12/31  brain]
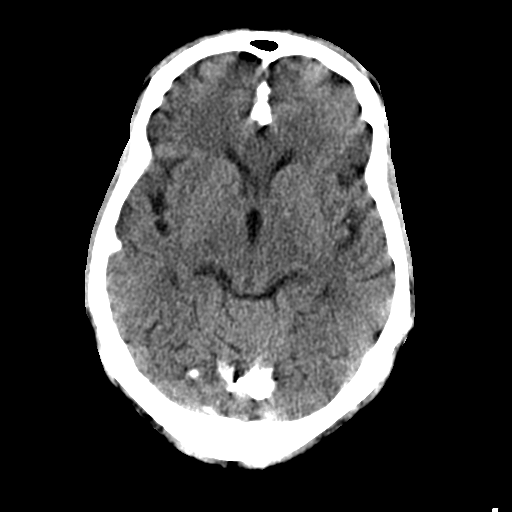
[im 16/31  brain]
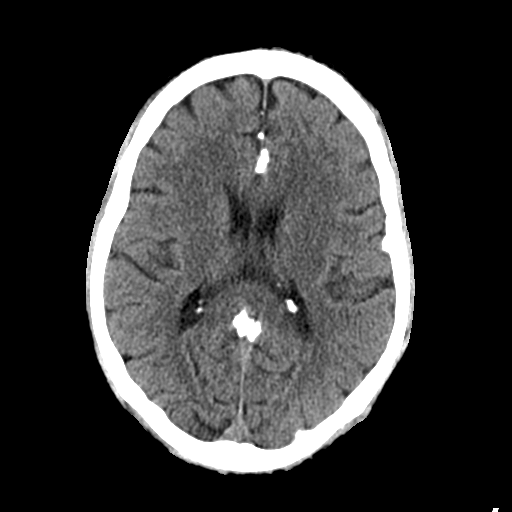
[im 19/31  brain]
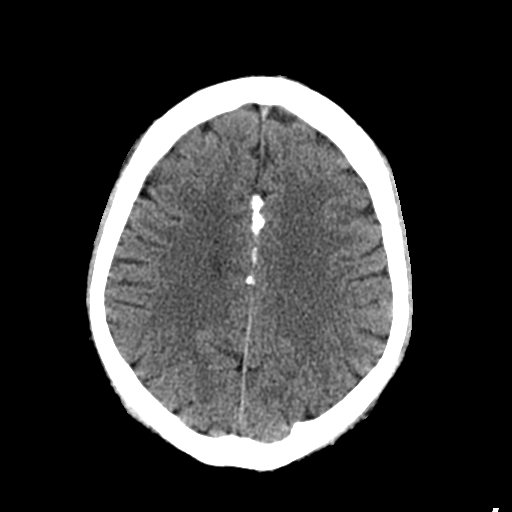
[im 19/31  bone]
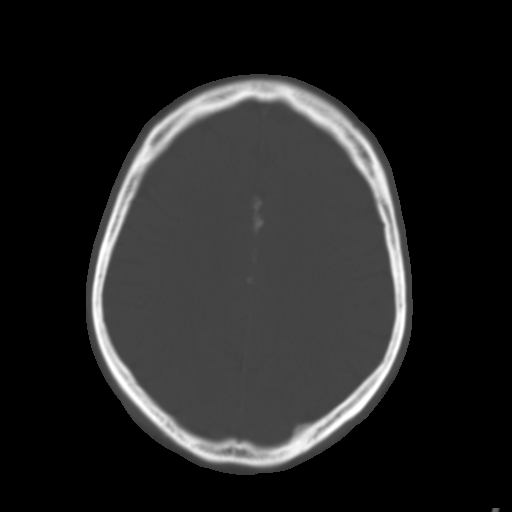
[im 23/31  brain]
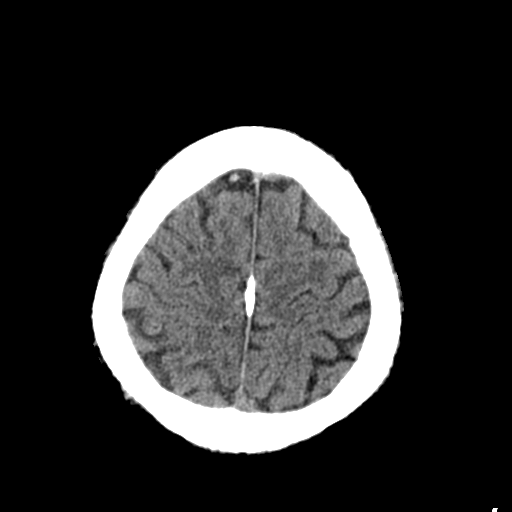
[im 27/31  brain]
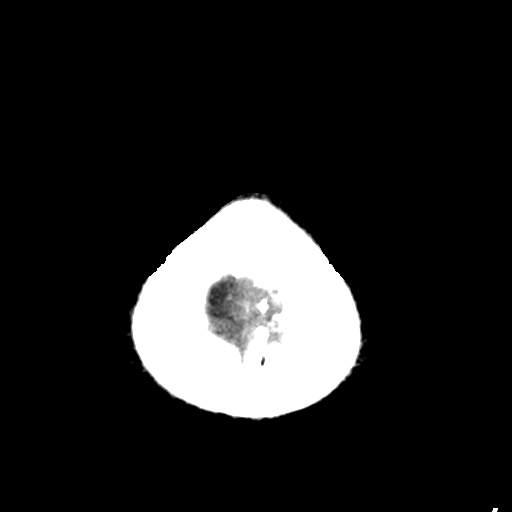

[Series 4: coronal soft tissue · coronal · 0.30mm/px · 3 of 69 slices shown]
[im 23/69  brain]
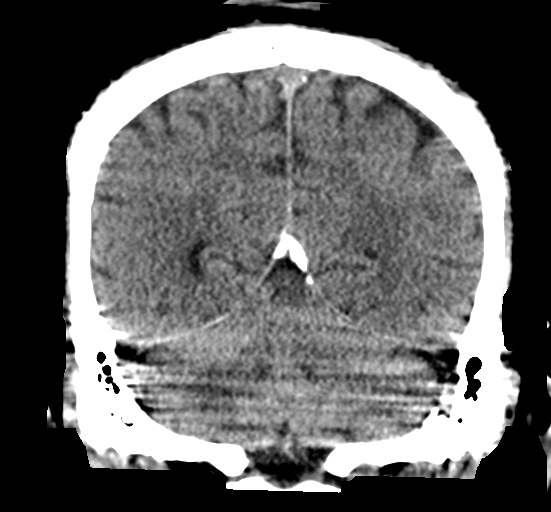
[im 31/69  brain]
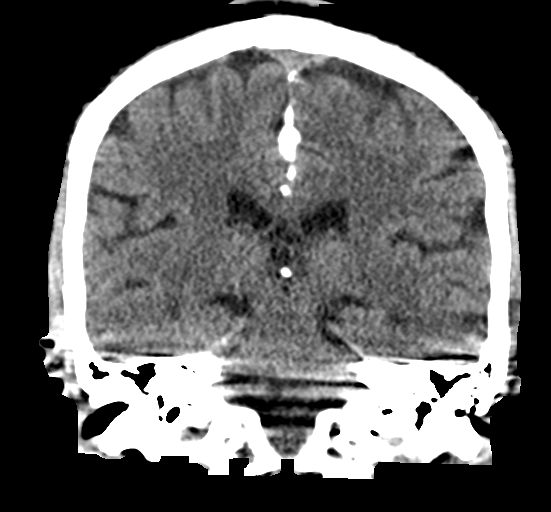
[im 38/69  brain]
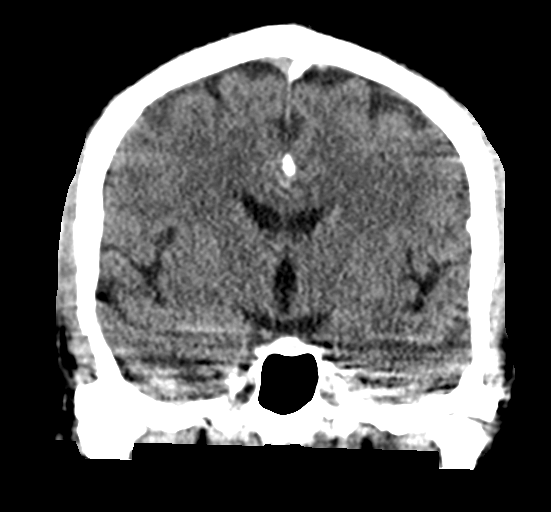

[Series 5: sagittal soft tissue · sagittal · 0.30mm/px · 3 of 54 slices shown]
[im 18/54  brain]
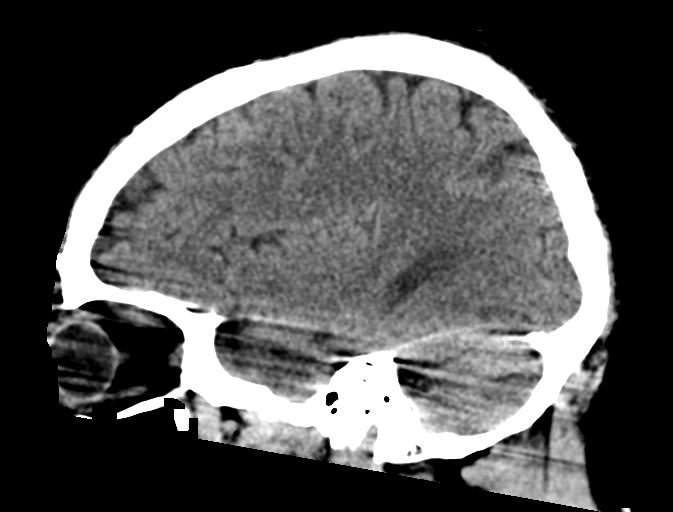
[im 27/54  brain]
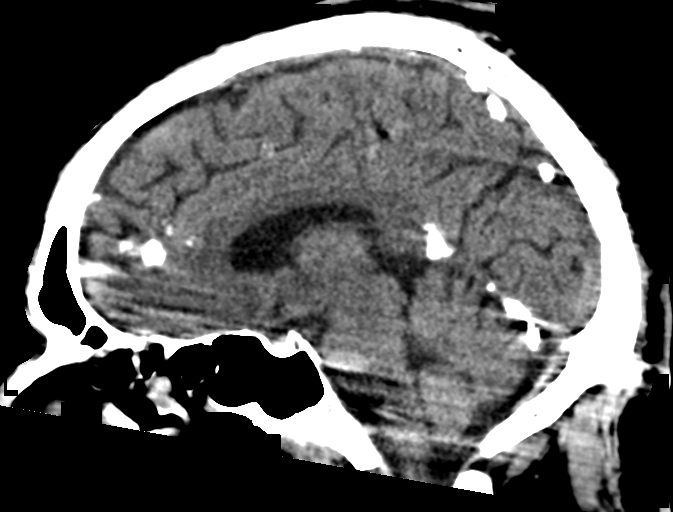
[im 36/54  brain]
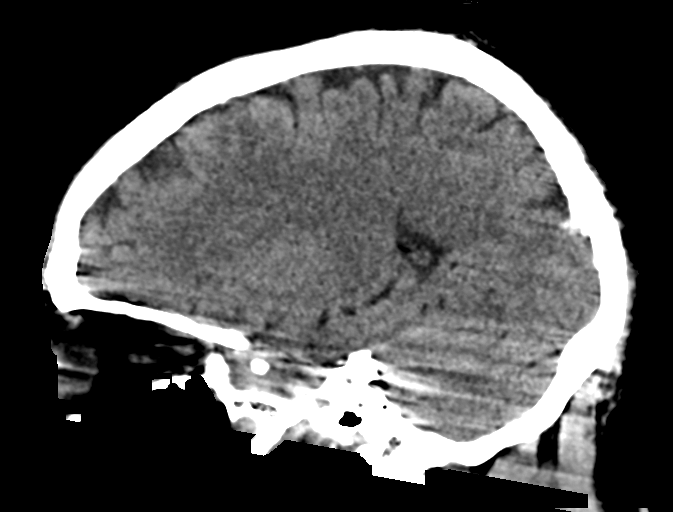

[16 of 47 positions shown; findings below may reference images not displayed]

FINDINGS: Brain: No acute intracranial hemorrhage, mass effect, or herniation.
No extra-axial fluid collections. No evidence of acute territorial
infarct. No hydrocephalus.

Vascular: No hyperdense vessel or unexpected calcification. Dense
falcine and tentorial calcifications again seen.

Skull: Normal. Negative for fracture or focal lesion.

Sinuses/Orbits: No acute finding.

Other: None.
IMPRESSION: No acute intracranial process identified.

## 2021-02-27 IMAGING — DX DG CHEST 1V PORT
1 series · 2 of 2 positions shown · non-contrast
Comparison: Chest radiograph [DATE]

CLINICAL DATA: Weakness

EXAM:
PORTABLE CHEST 1 VIEW

[Series 1: chest ap · 0.14mm/px · 2 of 2 slices shown]
[im 1/2]
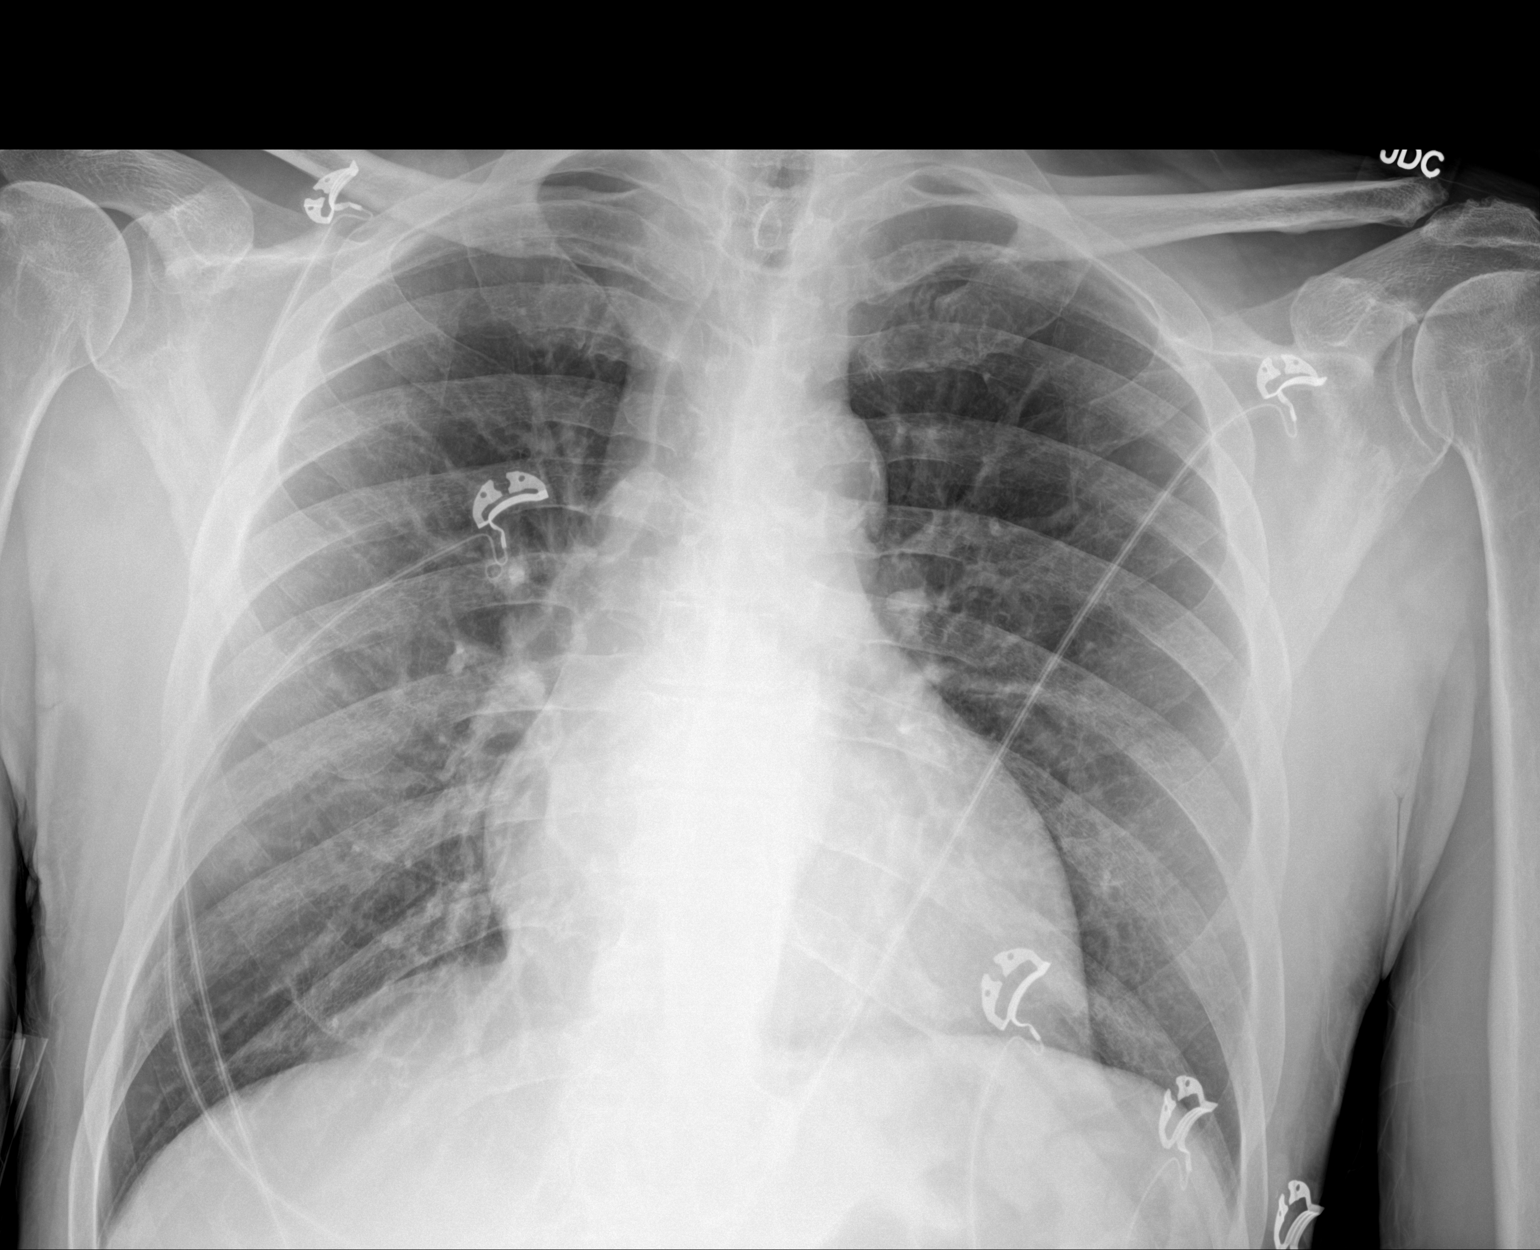
[im 2/2]
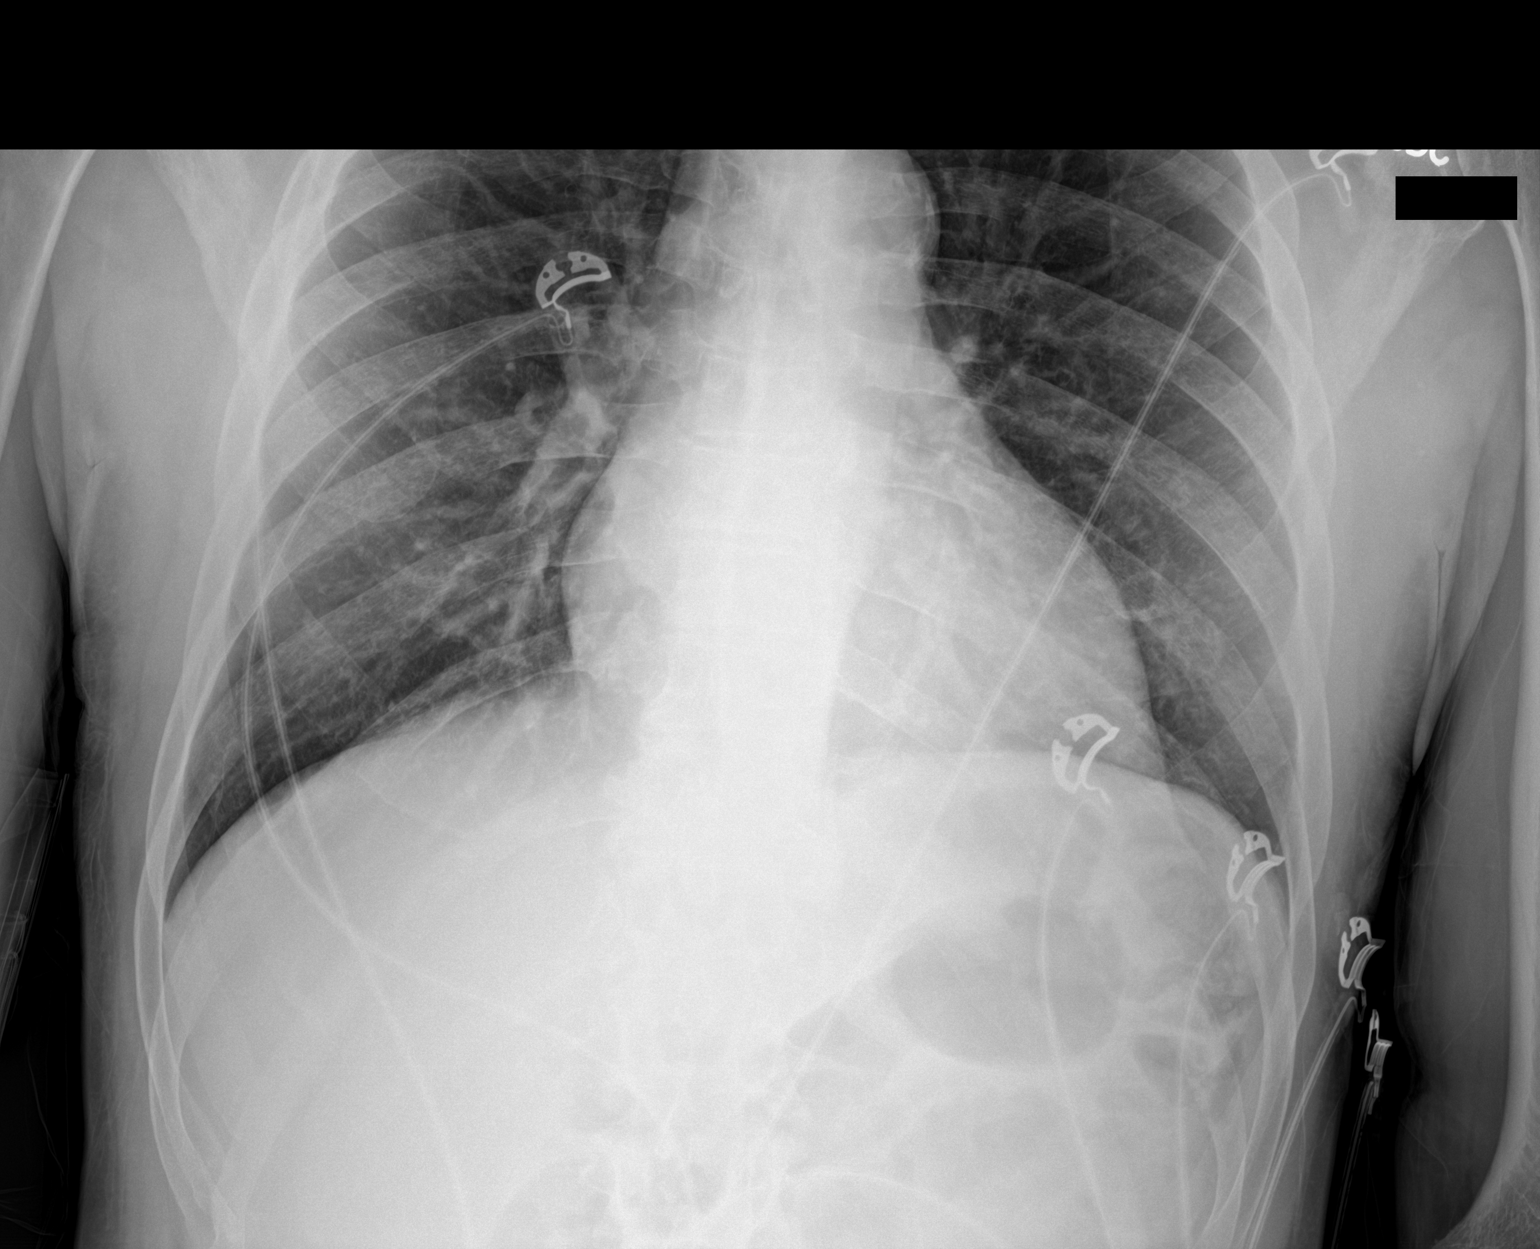

[2 of 2 positions shown; findings below may reference images not displayed]

FINDINGS: The cardiomediastinal silhouette is stable.

There is no focal consolidation or pulmonary edema. There is no
pleural effusion or pneumothorax.

There is no acute osseous abnormality.
IMPRESSION: No radiographic evidence of acute cardiopulmonary process.

## 2021-02-27 MED ORDER — SODIUM CHLORIDE 0.9 % IV BOLUS
1000.0000 mL | Freq: Once | INTRAVENOUS | Status: AC
  Administered 2021-02-27: 1000 mL via INTRAVENOUS

## 2021-02-27 MED ORDER — DIAZEPAM 5 MG PO TABS
5.0000 mg | ORAL_TABLET | Freq: Once | ORAL | Status: AC
  Administered 2021-02-27: 5 mg via ORAL
  Filled 2021-02-27: qty 1

## 2021-02-27 MED ORDER — THIAMINE HCL 100 MG/ML IJ SOLN
100.0000 mg | Freq: Once | INTRAMUSCULAR | Status: AC
  Administered 2021-02-27: 100 mg via INTRAVENOUS
  Filled 2021-02-27: qty 2

## 2021-02-27 NOTE — ED Notes (Signed)
Dietary called for pt for request chicken noodle soup and crackers

## 2021-02-27 NOTE — ED Provider Notes (Signed)
Patient signed out to me at 3 PM is a 87 disorder who presents with myoclonic jerking his work-up here is overall reassuring.  CT head and labs are unremarkable.  Troponins x2 in the setting.  He has been here for about 2 hours.  Plan to to see your complaints.  Refractory unable to ambulate needing admission.  After Valium patient significantly improved.  No longer having myoclonic jerking.  Provider had also spoke with neurology who did not have any specific recommendations.  He is stable for discharge.   Rada Hay, MD 02/27/21 [phone]

## 2021-02-27 NOTE — ED Triage Notes (Addendum)
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 Pt with hx myoclonic jerking that he was going to see a neuroloigst about but the jerking decreased. Pt stating jerking got worse 10hrs ago. Pt sustained an unwitnessed fall at work. Pt denies hitting his head or LOC. Pt denies any pain at this time. CBG on EMS arrival 61. Pt given D10 and repeat CBG 334. Pt given 156ml NS. CBG on arrival 173. Pt A&Ox4. 20g LAC. Pt hx HTN and DM. Pt stating he did take his insulin this morning without eating.

## 2021-02-27 NOTE — ED Notes (Signed)
Patient transported to CT 

## 2021-02-27 NOTE — ED Provider Notes (Signed)
[facility] Emergency Department Provider Note    Event Date/Time   First MD Initiated Contact with Patient 02/27/21 1238     (approximate)  I have reviewed the triage vital signs and the nursing notes.   HISTORY  Chief Complaint Hypoglycemia (Myoclonus jerking )    HPI Nicholas Weiss is a 70 y.o. male below listed past medical history presents to the ER for evaluation of fall that occurred at his group home today.  Patient has a history of myoclonic jerking not on any medications for this this is been dealing with this for quite some time.  Became worse about 10 hours ago.  States he has had some chills.  States this morning he took his insulin and did not eat any breakfast because he was preparing food for members of group home.  He denies any chest pain or pressure.  No shortness of breath.  Past Medical History:  Diagnosis Date   DKA (diabetic ketoacidoses) 04/06/2016   Hypercholesteremia    Hypertension    Family History  Problem Relation Age of Onset   Heart attack Father    Hypertension Sister    Cancer Sister    Past Surgical History:  Procedure Laterality Date   COLONOSCOPY WITH PROPOFOL N/A 02/01/2020   Procedure: COLONOSCOPY WITH PROPOFOL;  Surgeon: Lin Landsman, MD;  Location: [place];  Service: Gastroenterology;  Laterality: N/A;   ESOPHAGOGASTRODUODENOSCOPY  02/01/2020   Procedure: ESOPHAGOGASTRODUODENOSCOPY (EGD);  Surgeon: Lin Landsman, MD;  Location: [facility] END[facility]COPY;  Service: Gastroenterology;;   KNEE SURGERY Right    Torn meniscus   KNEE SURGERY Left    Patient Active Problem List   Diagnosis Date Noted   Acute metabolic encephalopathy 91/63/8466   Cocaine abuse ([place]) 11/30/2020   Muscle twitching 09/12/2020   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   Pre-syncope 08/01/2020   Heart block AV first degree 08/01/2020   Anemia of chronic kidney failure, stage 3 (moderate) ([place]) 05/28/2020   PUD (peptic ulcer  disease)    Esophageal dysphagia    Encounter for screening colonoscopy    Hyperglycemia due to type 2 diabetes mellitus ([place]) 07/28/2019   Generalized weakness 07/28/2019   Type 2 diabetes mellitus with hyperglycemia ([place]) 07/28/2019   Hypoglycemia 04/27/2019   Unresponsiveness 04/27/2019   Lung nodule 04/07/2019   Uncontrolled type 1 diabetes mellitus    Acute renal failure with acute tubular necrosis superimposed on stage 3a chronic kidney disease ([place])    Hypertensive urgency 04/05/2019   AKI (acute kidney injury) ([place]) 04/05/2019   CKD (chronic kidney disease) stage 3, GFR 30-59 ml/min ([place]) 04/05/2019   DKA (diabetic ketoacidoses) 04/06/2016   Hypercholesterolemia 01/24/2016   Essential (primary) hypertension 06/07/2006   Type 1 diabetes mellitus with diabetic neuropathy, unspecified ([place]) 05/31/2006      Prior to Admission medications   Medication Sig Start Date End Date Taking? Authorizing Provider  Alcohol Swabs (B-D SINGLE USE SWABS REGULAR) PADS Use to check blood sugar four times daily for type 1 diabetes 07/11/20   Birdie Sons, MD  amLODipine (NORVASC) 10 MG tablet Take 1 tablet (10 mg total) by mouth daily. 12/04/20   Aline August, MD  aspirin 81 MG chewable tablet Chew 1 tablet by mouth daily. Patient not taking: Reported on 12/17/2020    [provider]  Blood Glucose Calibration (TRUE METRIX LEVEL 1) Low SOLN Use to check blood sugar four times daily for type 1 diabetes 07/11/20   Birdie Sons,  MD  insulin aspart (NOVOLOG) 100 UNIT/ML injection Inject up to 14 units three times daily before meals according to sliding scale 06/25/20   Birdie Sons, MD  insulin degludec (TRESIBA FLEXTOUCH) 100 UNIT/ML FlexTouch Pen 15 units daily. Patient taking differently: Inject 15 Units into the skin daily. 15 units daily. 12/04/20   Aline August, MD  Insulin Syringe-Needle U-100 (INSULIN SYRINGE 1CC/31GX5/16") 31G X 5/16" 1 ML MISC For insulin injections up to 4  times daily 07/11/20   Birdie Sons, MD  lisinopril (ZESTRIL) 2.5 MG tablet Take 2.5 mg by mouth daily. 11/08/20 11/08/21  [provider]  lovastatin (MEVACOR) 40 MG tablet Take 40 mg by mouth at bedtime. 04/10/19   [provider]  pregabalin (LYRICA) 75 MG capsule Take 1 capsule by mouth twice daily 11/08/20   Birdie Sons, MD    Allergies Penicillins    Social History Social History   Tobacco Use   Smoking status: Never   Smokeless tobacco: Never  Vaping Use   Vaping Use: Never used  Substance Use Topics   Alcohol use: Yes    Alcohol/week: 0.0 - 1.0 standard drinks    Comment: "once every 2 months"   Drug use: Not Currently    Types: Marijuana    Comment: "none in a long time'    Review of Systems Patient denies headaches, rhinorrhea, blurry vision, numbness, shortness of breath, chest pain, edema, cough, abdominal pain, nausea, vomiting, diarrhea, dysuria, fevers, rashes or hallucinations unless otherwise stated above in HPI. ____________________________________________   PHYSICAL EXAM:  VITAL SIGNS: Vitals:   02/27/21 1415 02/27/21 1425  BP:  (!) 147/64  Pulse: 78 78  Resp:  (!) 22  Temp:    SpO2: 100% 100%    Constitutional: Alert and oriented.  Eyes: Conjunctivae are normal.  Head: Atraumatic. Nose: No congestion/rhinnorhea. Mouth/Throat: Mucous membranes are moist.   Neck: No stridor. Painless ROM.  Cardiovascular: Normal rate, regular rhythm. Grossly normal heart sounds.  Good peripheral circulation. Respiratory: Normal respiratory effort.  No retractions. Lungs CTAB. Gastrointestinal: Soft and nontender. No distention. No abdominal bruits. No CVA tenderness. Genitourinary:  Musculoskeletal: No lower extremity tenderness nor edema.  No joint effusions. Neurologic:  Normal speech and language. Diffuse bilateral intermittent myoclonic episodes.  Able to follow commands Skin:  Skin is warm, dry and intact. No rash  noted. Psychiatric: Mood and affect are normal. Speech and behavior are normal.  ____________________________________________   LABS (all labs ordered are listed, but only abnormal results are displayed)  Results for orders placed or performed during the hospital encounter of 02/27/21 (from the past 24 hour(s))  CBG monitoring, ED     Status: Abnormal   Collection Time: 02/27/21 12:39 PM  Result Value Ref Range   Glucose-Capillary 173 (H) 70 - 99 mg/dL  Basic metabolic panel     Status: Abnormal   Collection Time: 02/27/21 12:49 PM  Result Value Ref Range   Sodium 132 (L) 135 - 145 mmol/L   Potassium 4.0 3.5 - 5.1 mmol/L   Chloride 103 98 - 111 mmol/L   CO2 26 22 - 32 mmol/L   Glucose, Bld 162 (H) 70 - 99 mg/dL   BUN 35 (H) 8 - 23 mg/dL   Creatinine, Ser 1.74 (H) 0.61 - 1.24 mg/dL   Calcium 8.1 (L) 8.9 - 10.3 mg/dL   GFR, Estimated 42 (L) >60 mL/min   Anion gap 3 (L) 5 - 15  CBC     Status: Abnormal  Collection Time: 02/27/21 12:49 PM  Result Value Ref Range   WBC 2.5 (L) 4.0 - 10.5 K/uL   RBC 3.24 (L) 4.22 - 5.81 MIL/uL   Hemoglobin 9.9 (L) 13.0 - 17.0 g/dL   HCT 30.8 (L) 39.0 - 52.0 %   MCV 95.1 80.0 - 100.0 fL   MCH 30.6 26.0 - 34.0 pg   MCHC 32.1 30.0 - 36.0 g/dL   RDW 14.9 11.5 - 15.5 %   Platelets 189 150 - 400 K/uL   nRBC 0.0 0.0 - 0.2 %  Urinalysis, Complete w Microscopic     Status: Abnormal   Collection Time: 02/27/21 12:49 PM  Result Value Ref Range   Color, Urine YELLOW YELLOW   APPearance CLEAR CLEAR   Specific Gravity, Urine 1.010 1.005 - 1.030   pH 5.5 5.0 - 8.0   Glucose, UA >1,000 (A) NEGATIVE mg/dL   Hgb urine dipstick TRACE (A) NEGATIVE   Bilirubin Urine NEGATIVE NEGATIVE   Ketones, ur NEGATIVE NEGATIVE mg/dL   Protein, ur NEGATIVE NEGATIVE mg/dL   Nitrite NEGATIVE NEGATIVE   Leukocytes,Ua NEGATIVE NEGATIVE   Squamous Epithelial / LPF NONE SEEN 0 - 5   WBC, UA NONE SEEN 0 - 5 WBC/hpf   RBC / HPF NONE SEEN 0 - 5 RBC/hpf   Bacteria, UA NONE  SEEN NONE SEEN  Urine Drug Screen, Qualitative ([facility] only)     Status: Abnormal   Collection Time: 02/27/21 12:49 PM  Result Value Ref Range   Tricyclic, Ur Screen NONE DETECTED NONE DETECTED   Amphetamines, Ur Screen NONE DETECTED NONE DETECTED   MDMA (Ecstasy)Ur Screen NONE DETECTED NONE DETECTED   Cocaine Metabolite,Ur [place] NONE DETECTED NONE DETECTED   Opiate, Ur Screen NONE DETECTED NONE DETECTED   Phencyclidine (PCP) Ur S NONE DETECTED NONE DETECTED   Cannabinoid 50 Ng, Ur [place] POSITIVE (A) NONE DETECTED   Barbiturates, Ur Screen NONE DETECTED NONE DETECTED   Benzodiazepine, Ur Scrn NONE DETECTED NONE DETECTED   Methadone Scn, Ur NONE DETECTED NONE DETECTED  Resp Panel by RT-PCR (Flu A&B, Covid) Nasopharyngeal Swab     Status: None   Collection Time: 02/27/21  1:49 PM   Specimen: Nasopharyngeal Swab; Nasopharyngeal(NP) swabs in vial transport medium  Result Value Ref Range   SARS Coronavirus 2 by RT PCR NEGATIVE NEGATIVE   Influenza A by PCR NEGATIVE NEGATIVE   Influenza B by PCR NEGATIVE NEGATIVE  CBG monitoring, ED     Status: Abnormal   Collection Time: 02/27/21  2:07 PM  Result Value Ref Range   Glucose-Capillary 157 (H) 70 - 99 mg/dL   ____________________________________________  EKG My review and personal interpretation at Time: 12:42   Indication: hypoglycemia  Rate: 110  Rhythm: sinus Axis: normal Other: motion artifact, non specific t wave abn ____________________________________________  RADIOLOGY  I personally reviewed all radiographic images ordered to evaluate for the above acute complaints and reviewed radiology reports and findings.  These findings were personally discussed with the patient.  Please see medical record for radiology report.  ____________________________________________   PROCEDURES  Procedure(s) performed:  Procedures    Critical Care performed: no ____________________________________________   INITIAL IMPRESSION / ASSESSMENT AND  PLAN / ED COURSE  Pertinent labs & imaging results that were available during my care of the patient were reviewed by me and considered in my medical decision making (see chart for details).   DDX: dka, hypoglcyemia, sepsis, dehydration,d ysrhythmia, cva  VICTORIOUS KUNDINGER is a 70 y.o. who presents  to the ED with jerking episode of myoclonic appearing with episode of borderline low blood sugar.  Patient took his morning diabetic medication did not have anything to eat.  Is otherwise been compliant with his medications.  Blood work sent for the blood differential.  I will order CT imaging.  Is nontoxic-appearing hemodynamically stable protecting his airway afebrile.  He has a history of similar myoclonic episodes that resolved and were previously felt to be related to polysubstance use.  Clinical Course as of 02/27/21 1523  Thu Feb 27, 2021  1353 CT imaging reassuring.  Still having tremor.  Patient denies any discomfort at this time.  Has chronic leukopenia.  Blood work otherwise at baseline, no sign of DKA.  Giving IV fluids. [PR]  1518 Patient still with persistent myoclonic episodes.  Will give low-dose of oral benzo.  He is mentating appropriately.  He does admit to marijuana use and has a history of cocaine abuse.  I do not feel that further neuroimaging clinically indicated based on his presentation.  We will add on ammonia level.  Patient be signed out to oncoming physician for further observation and reassessment. [PR]    Clinical Course User Index [PR] Merlyn Lot, MD    The patient was evaluated in Emergency Department today for the symptoms described in the history of present illness. He/she was evaluated in the context of the global COVID-19 pandemic, which necessitated consideration that the patient might be at risk for infection with the SARS-CoV-2 virus that causes COVID-19. Institutional protocols and algorithms that pertain to the evaluation of patients at risk for COVID-19 are  in a state of rapid change based on information released by regulatory bodies including the CDC and federal and state organizations. These policies and algorithms were followed during the patient's care in the ED.  As part of my medical decision making, I reviewed the following data within the [facility] notes reviewed and incorporated, Labs reviewed, notes from prior ED visits and [place] Controlled Substance Database   ____________________________________________   FINAL CLINICAL IMPRESSION(S) / ED DIAGNOSES  Final diagnoses:  Myoclonus      NEW MEDICATIONS STARTED DURING THIS VISIT:  New Prescriptions   No medications on file     Note:  This document was prepared using Dragon voice recognition software and may include unintentional dictation errors.    Merlyn Lot, MD 02/27/21 [phone]

## 2021-02-27 NOTE — ED Notes (Signed)
Unable to obtain accurate BP at this time due to myoclonic episodes.

## 2021-02-28 ENCOUNTER — Other Ambulatory Visit: Payer: Self-pay

## 2021-03-06 ENCOUNTER — Other Ambulatory Visit: Payer: Self-pay | Admitting: *Deleted

## 2021-03-06 DIAGNOSIS — D631 Anemia in chronic kidney disease: Secondary | ICD-10-CM

## 2021-03-06 DIAGNOSIS — D472 Monoclonal gammopathy: Secondary | ICD-10-CM

## 2021-03-11 ENCOUNTER — Inpatient Hospital Stay: Payer: Medicare (Managed Care) | Attending: Oncology

## 2021-03-25 ENCOUNTER — Other Ambulatory Visit: Payer: Self-pay | Admitting: *Deleted

## 2021-03-25 DIAGNOSIS — D631 Anemia in chronic kidney disease: Secondary | ICD-10-CM

## 2021-04-01 ENCOUNTER — Inpatient Hospital Stay: Payer: Medicare (Managed Care) | Attending: Oncology

## 2021-04-01 ENCOUNTER — Other Ambulatory Visit: Payer: Self-pay

## 2021-04-01 VITALS — BP 160/83 | HR 85

## 2021-04-01 DIAGNOSIS — D631 Anemia in chronic kidney disease: Secondary | ICD-10-CM | POA: Diagnosis present

## 2021-04-01 DIAGNOSIS — N183 Chronic kidney disease, stage 3 unspecified: Secondary | ICD-10-CM

## 2021-04-01 DIAGNOSIS — N1832 Chronic kidney disease, stage 3b: Secondary | ICD-10-CM | POA: Insufficient documentation

## 2021-04-01 DIAGNOSIS — D472 Monoclonal gammopathy: Secondary | ICD-10-CM

## 2021-04-01 LAB — CBC WITH DIFFERENTIAL/PLATELET
Abs Immature Granulocytes: 0 10*3/uL (ref 0.00–0.07)
Basophils Absolute: 0 10*3/uL (ref 0.0–0.1)
Basophils Relative: 0 %
Eosinophils Absolute: 0 10*3/uL (ref 0.0–0.5)
Eosinophils Relative: 0 %
HCT: 29.3 % — ABNORMAL LOW (ref 39.0–52.0)
Hemoglobin: 9.5 g/dL — ABNORMAL LOW (ref 13.0–17.0)
Immature Granulocytes: 0 %
Lymphocytes Relative: 27 %
Lymphs Abs: 0.7 10*3/uL (ref 0.7–4.0)
MCH: 30.8 pg (ref 26.0–34.0)
MCHC: 32.4 g/dL (ref 30.0–36.0)
MCV: 95.1 fL (ref 80.0–100.0)
Monocytes Absolute: 0.3 10*3/uL (ref 0.1–1.0)
Monocytes Relative: 12 %
Neutro Abs: 1.7 10*3/uL (ref 1.7–7.7)
Neutrophils Relative %: 61 %
Platelets: 179 10*3/uL (ref 150–400)
RBC: 3.08 MIL/uL — ABNORMAL LOW (ref 4.22–5.81)
RDW: 14.1 % (ref 11.5–15.5)
WBC: 2.7 10*3/uL — ABNORMAL LOW (ref 4.0–10.5)
nRBC: 0 % (ref 0.0–0.2)

## 2021-04-01 MED ORDER — EPOETIN ALFA-EPBX 40000 UNIT/ML IJ SOLN
40000.0000 [IU] | Freq: Once | INTRAMUSCULAR | Status: AC
  Administered 2021-04-01: 40000 [IU] via SUBCUTANEOUS
  Filled 2021-04-01: qty 1

## 2021-04-22 ENCOUNTER — Inpatient Hospital Stay: Payer: Medicare (Managed Care)

## 2021-04-23 ENCOUNTER — Telehealth: Payer: Self-pay | Admitting: Oncology

## 2021-04-23 NOTE — Telephone Encounter (Signed)
Pt called to reschedule his appt that he missed on 1-24. Call back at [phone]

## 2021-04-24 ENCOUNTER — Other Ambulatory Visit: Payer: Self-pay

## 2021-04-24 ENCOUNTER — Inpatient Hospital Stay: Payer: Medicare (Managed Care)

## 2021-04-24 VITALS — BP 160/82 | HR 88

## 2021-04-24 DIAGNOSIS — D472 Monoclonal gammopathy: Secondary | ICD-10-CM

## 2021-04-24 DIAGNOSIS — N183 Chronic kidney disease, stage 3 unspecified: Secondary | ICD-10-CM

## 2021-04-24 DIAGNOSIS — D631 Anemia in chronic kidney disease: Secondary | ICD-10-CM

## 2021-04-24 DIAGNOSIS — N1832 Chronic kidney disease, stage 3b: Secondary | ICD-10-CM | POA: Diagnosis not present

## 2021-04-24 LAB — HEMOGLOBIN AND HEMATOCRIT, BLOOD
HCT: 28.3 % — ABNORMAL LOW (ref 39.0–52.0)
Hemoglobin: 9.5 g/dL — ABNORMAL LOW (ref 13.0–17.0)

## 2021-04-24 MED ORDER — EPOETIN ALFA-EPBX 40000 UNIT/ML IJ SOLN
40000.0000 [IU] | Freq: Once | INTRAMUSCULAR | Status: AC
  Administered 2021-04-24: 40000 [IU] via SUBCUTANEOUS
  Filled 2021-04-24: qty 1

## 2021-04-25 NOTE — Progress Notes (Signed)
Patient has stage IIIb CKD Dr. Randa Evens, MD, MPH [facility] [phone] 04/25/2021 10:31 AM

## 2021-05-07 ENCOUNTER — Emergency Department
Admission: EM | Admit: 2021-05-07 | Discharge: 2021-05-07 | Disposition: A | Discharge status: Home / Self Care | Payer: Medicare (Managed Care) | Attending: Student in an Organized Health Care Education/Training Program | Admitting: Student in an Organized Health Care Education/Training Program

## 2021-05-07 ENCOUNTER — Emergency Department: Payer: Medicare (Managed Care)

## 2021-05-07 ENCOUNTER — Other Ambulatory Visit: Payer: Self-pay

## 2021-05-07 DIAGNOSIS — N189 Chronic kidney disease, unspecified: Secondary | ICD-10-CM | POA: Insufficient documentation

## 2021-05-07 DIAGNOSIS — I129 Hypertensive chronic kidney disease with stage 1 through stage 4 chronic kidney disease, or unspecified chronic kidney disease: Secondary | ICD-10-CM | POA: Diagnosis not present

## 2021-05-07 DIAGNOSIS — E1022 Type 1 diabetes mellitus with diabetic chronic kidney disease: Secondary | ICD-10-CM | POA: Diagnosis not present

## 2021-05-07 DIAGNOSIS — E1065 Type 1 diabetes mellitus with hyperglycemia: Secondary | ICD-10-CM | POA: Diagnosis not present

## 2021-05-07 DIAGNOSIS — R519 Headache, unspecified: Secondary | ICD-10-CM | POA: Insufficient documentation

## 2021-05-07 DIAGNOSIS — R1013 Epigastric pain: Secondary | ICD-10-CM | POA: Diagnosis not present

## 2021-05-07 DIAGNOSIS — Z79899 Other long term (current) drug therapy: Secondary | ICD-10-CM | POA: Insufficient documentation

## 2021-05-07 DIAGNOSIS — Z20822 Contact with and (suspected) exposure to covid-19: Secondary | ICD-10-CM | POA: Diagnosis not present

## 2021-05-07 DIAGNOSIS — R0602 Shortness of breath: Secondary | ICD-10-CM | POA: Diagnosis not present

## 2021-05-07 DIAGNOSIS — R112 Nausea with vomiting, unspecified: Secondary | ICD-10-CM | POA: Diagnosis present

## 2021-05-07 LAB — URINALYSIS, COMPLETE (UACMP) WITH MICROSCOPIC
Bacteria, UA: NONE SEEN
Bilirubin Urine: NEGATIVE
Glucose, UA: >=500 mg/dL — AB
Ketones, ur: NEGATIVE mg/dL
Leukocytes,Ua: NEGATIVE
Nitrite: NEGATIVE
Protein, ur: 30 mg/dL — AB
Specific Gravity, Urine: 1.015 (ref 1.005–1.030)
Squamous Epithelial / HPF: NONE SEEN (ref 0–5)
pH: 8 (ref 5.0–8.0)

## 2021-05-07 LAB — CBC
HCT: 34.6 % — ABNORMAL LOW (ref 39.0–52.0)
Hemoglobin: 11.1 g/dL — ABNORMAL LOW (ref 13.0–17.0)
MCH: 30.9 pg (ref 26.0–34.0)
MCHC: 32.1 g/dL (ref 30.0–36.0)
MCV: 96.4 fL (ref 80.0–100.0)
Platelets: 270 10*3/uL (ref 150–400)
RBC: 3.59 MIL/uL — ABNORMAL LOW (ref 4.22–5.81)
RDW: 14.6 % (ref 11.5–15.5)
WBC: 6.9 10*3/uL (ref 4.0–10.5)
nRBC: 0 % (ref 0.0–0.2)

## 2021-05-07 LAB — URINE DRUG SCREEN, QUALITATIVE (ARMC ONLY)
Amphetamines, Ur Screen: NOT DETECTED
Barbiturates, Ur Screen: NOT DETECTED
Benzodiazepine, Ur Scrn: NOT DETECTED
Cannabinoid 50 Ng, Ur ~~LOC~~: POSITIVE — AB
Cocaine Metabolite,Ur ~~LOC~~: NOT DETECTED
MDMA (Ecstasy)Ur Screen: NOT DETECTED
Methadone Scn, Ur: NOT DETECTED
Opiate, Ur Screen: NOT DETECTED
Phencyclidine (PCP) Ur S: NOT DETECTED
Tricyclic, Ur Screen: NOT DETECTED

## 2021-05-07 LAB — COMPREHENSIVE METABOLIC PANEL
ALT: 15 U/L (ref 0–44)
AST: 22 U/L (ref 15–41)
Albumin: 3.3 g/dL — ABNORMAL LOW (ref 3.5–5.0)
Alkaline Phosphatase: 52 U/L (ref 38–126)
Anion gap: 8 (ref 5–15)
BUN: 29 mg/dL — ABNORMAL HIGH (ref 8–23)
CO2: 24 mmol/L (ref 22–32)
Calcium: 8.5 mg/dL — ABNORMAL LOW (ref 8.9–10.3)
Chloride: 99 mmol/L (ref 98–111)
Creatinine, Ser: 1.51 mg/dL — ABNORMAL HIGH (ref 0.61–1.24)
GFR, Estimated: 50 mL/min — ABNORMAL LOW (ref >60)
Glucose, Bld: 329 mg/dL — ABNORMAL HIGH (ref 70–99)
Potassium: 4.6 mmol/L (ref 3.5–5.1)
Sodium: 131 mmol/L — ABNORMAL LOW (ref 135–145)
Total Bilirubin: 0.6 mg/dL (ref 0.3–1.2)
Total Protein: 10.6 g/dL — ABNORMAL HIGH (ref 6.5–8.1)

## 2021-05-07 LAB — BLOOD GAS, VENOUS
Acid-Base Excess: 4.2 mmol/L — ABNORMAL HIGH (ref 0.0–2.0)
Bicarbonate: 28.4 mmol/L — ABNORMAL HIGH (ref 20.0–28.0)
O2 Saturation: 44.6 %
Patient temperature: 37
pCO2, Ven: 40 mmHg — ABNORMAL LOW (ref 44.0–60.0)
pH, Ven: 7.46 — ABNORMAL HIGH (ref 7.250–7.430)
pO2, Ven: <31 mmHg — CL (ref 32.0–45.0)

## 2021-05-07 LAB — RESP PANEL BY RT-PCR (FLU A&B, COVID) ARPGX2
Influenza A by PCR: NEGATIVE
Influenza B by PCR: NEGATIVE
SARS Coronavirus 2 by RT PCR: NEGATIVE

## 2021-05-07 LAB — CBG MONITORING, ED
Glucose-Capillary: 322 mg/dL — ABNORMAL HIGH (ref 70–99)
Glucose-Capillary: 187 mg/dL — ABNORMAL HIGH (ref 70–99)

## 2021-05-07 LAB — ETHANOL: Alcohol, Ethyl (B): <10 mg/dL (ref <10)

## 2021-05-07 LAB — TROPONIN I (HIGH SENSITIVITY)
Troponin I (High Sensitivity): 19 ng/L — ABNORMAL HIGH (ref <18)
Troponin I (High Sensitivity): 16 ng/L (ref <18)

## 2021-05-07 LAB — LACTIC ACID, PLASMA: Lactic Acid, Venous: 1.7 mmol/L (ref 0.5–1.9)

## 2021-05-07 LAB — LIPASE, BLOOD: Lipase: 30 U/L (ref 11–51)

## 2021-05-07 IMAGING — DX DG CHEST 1V PORT
1 series · 1 of 1 positions shown · non-contrast
Comparison: Chest x-ray [DATE].

CLINICAL DATA: 70-year-old male with history of shortness of
breath.

EXAM:
PORTABLE CHEST 1 VIEW

[chest ap]
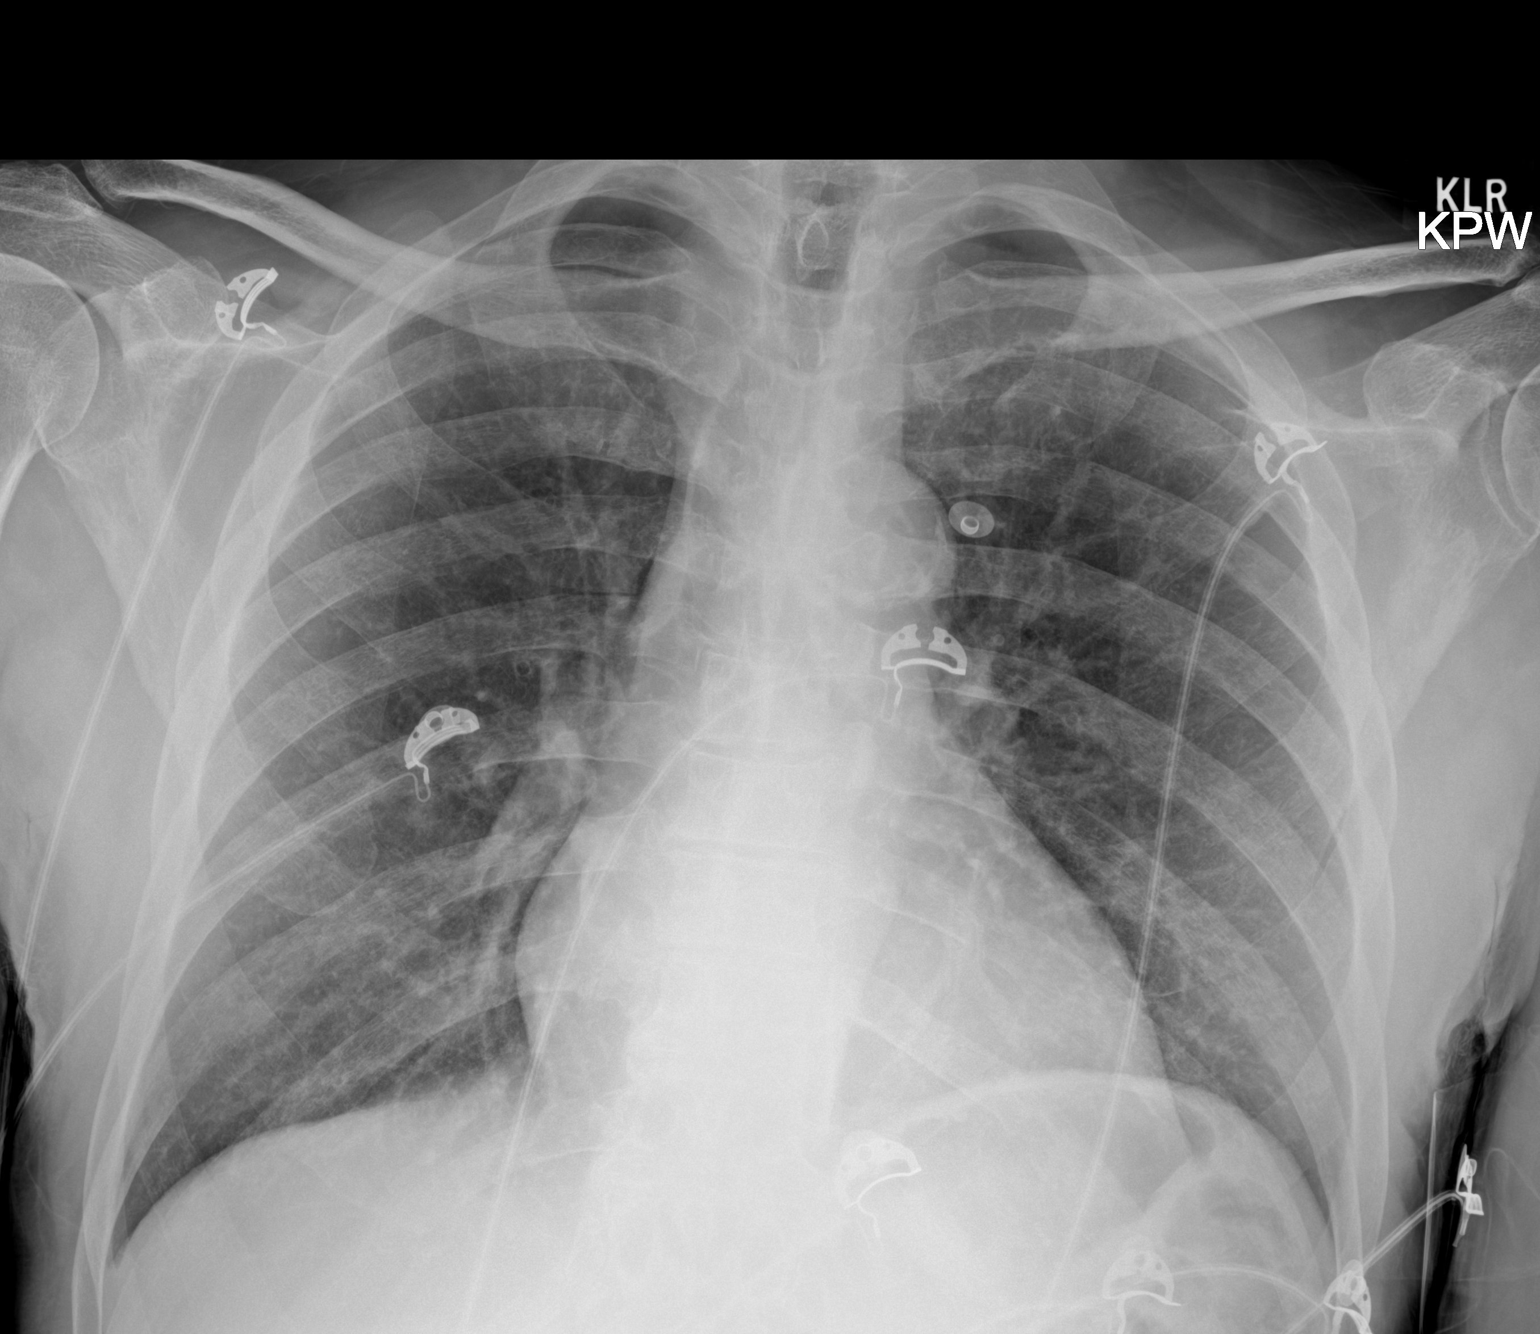

[1 of 1 positions shown; findings below may reference images not displayed]

FINDINGS: Lung volumes are normal. No consolidative airspace disease. No
pleural effusions. No pneumothorax. No pulmonary nodule or mass
noted. Pulmonary vasculature and the cardiomediastinal silhouette
are within normal limits. Atherosclerosis in the thoracic aorta.
IMPRESSION: 1.  No radiographic evidence of acute cardiopulmonary disease.
2. Aortic atherosclerosis.

## 2021-05-07 IMAGING — CT CT HEAD W/O CM
4 series · 17 of 47 positions shown, 19 images · non-contrast
Comparison: [DATE]

CLINICAL DATA: Mental status change.



[Series 3: head wo · axial · 0.41mm/px · z∈[-89,+26]mm · 7 of 31 slices shown, 9 images]
[im 4/31  brain]
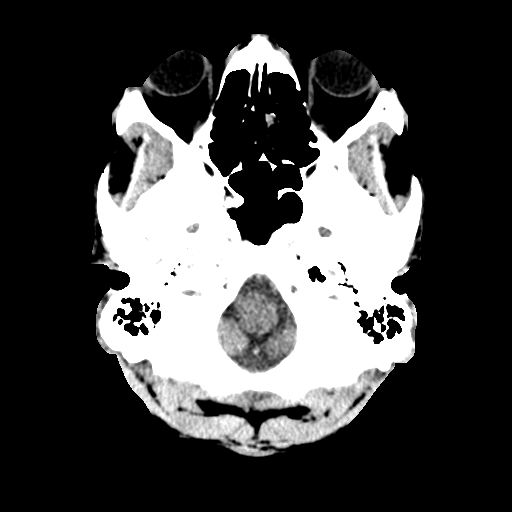
[im 4/31  bone]
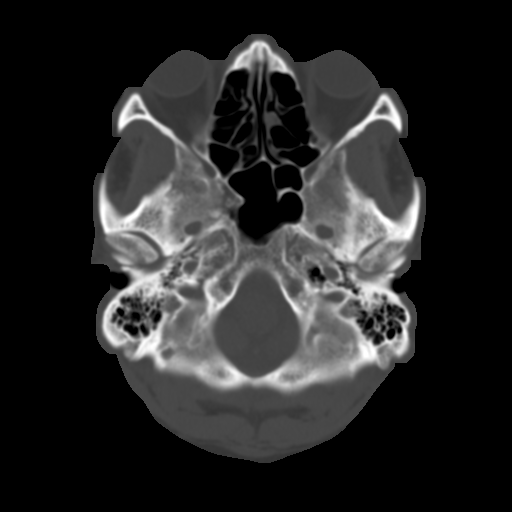
[im 8/31  brain]
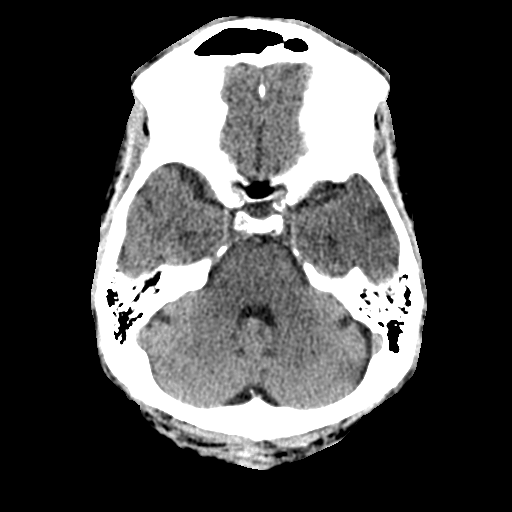
[im 12/31  brain]
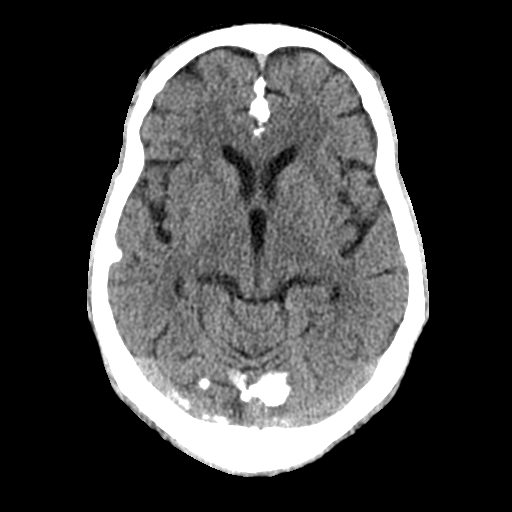
[im 16/31  brain]
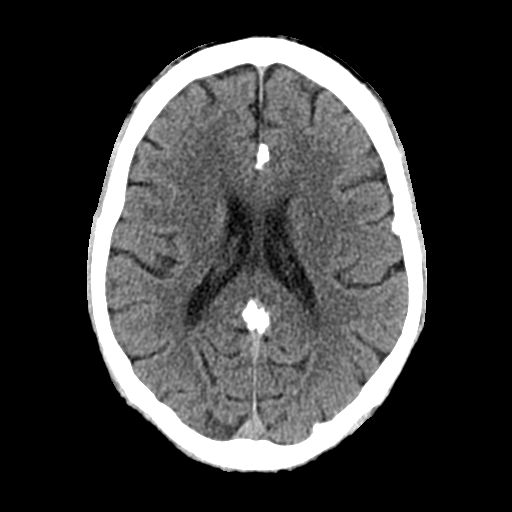
[im 19/31  brain]
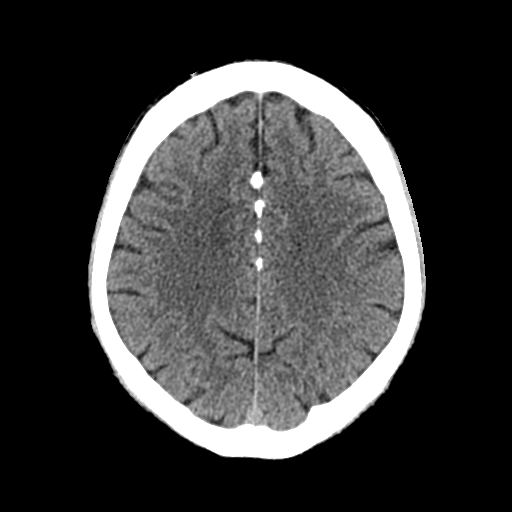
[im 19/31  bone]
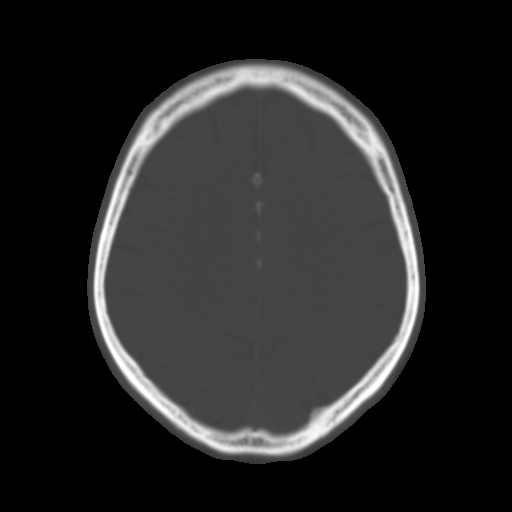
[im 23/31  brain]
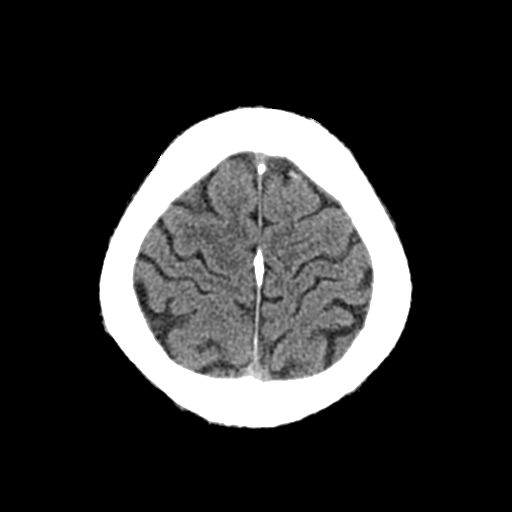
[im 27/31  brain]
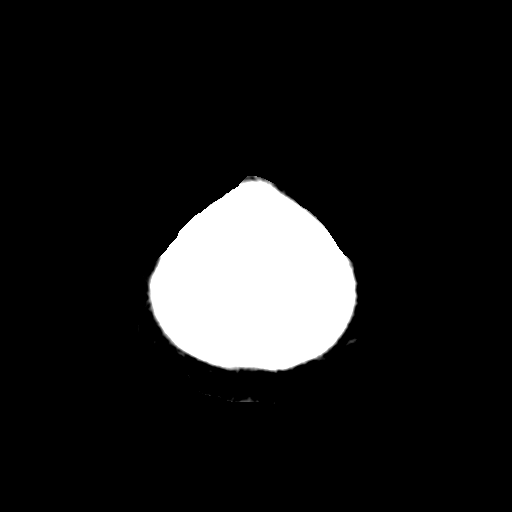

[Series 4: head bone · axial · 0.41mm/px · z∈[-90,-38]mm · 4 of 76 slices shown]
[im 8/76  bone]
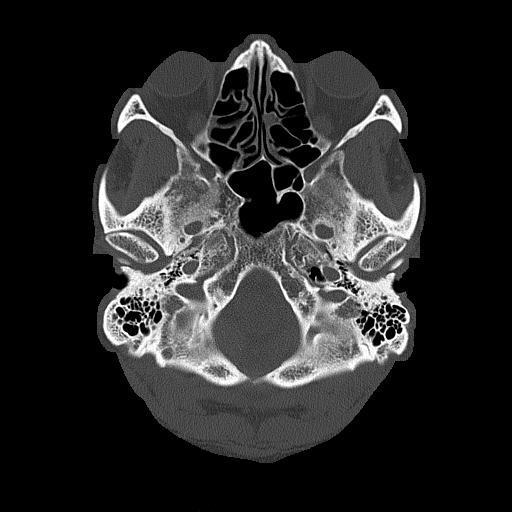
[im 16/76  bone]
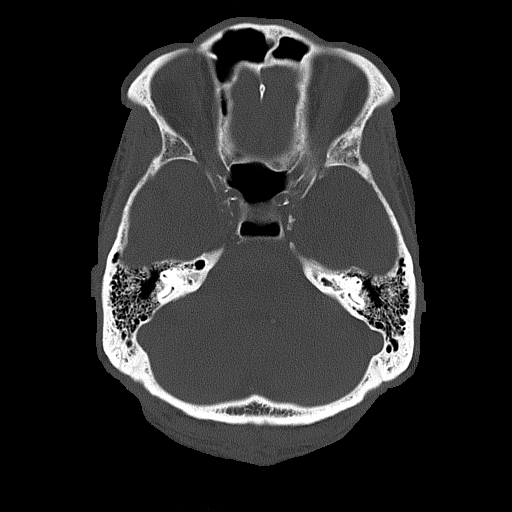
[im 23/76  bone]
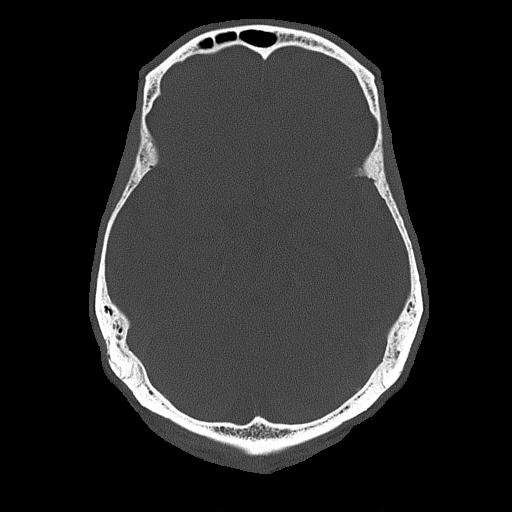
[im 34/76  bone]
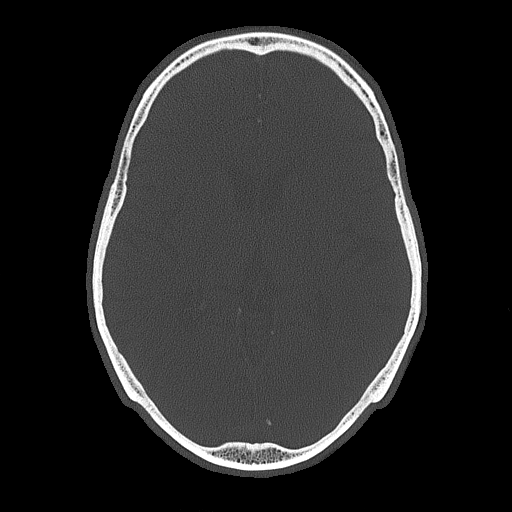

[Series 5: coronal soft tissue · coronal · 0.30mm/px · 3 of 68 slices shown]
[im 23/68  brain]
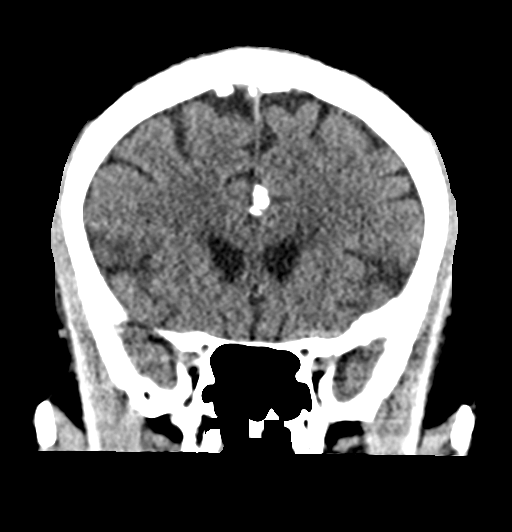
[im 30/68  brain]
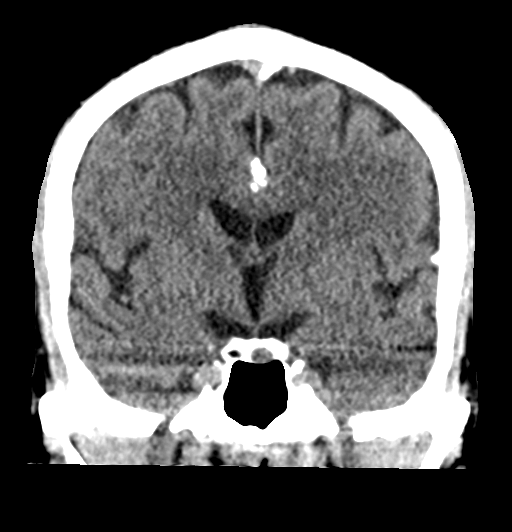
[im 38/68  brain]
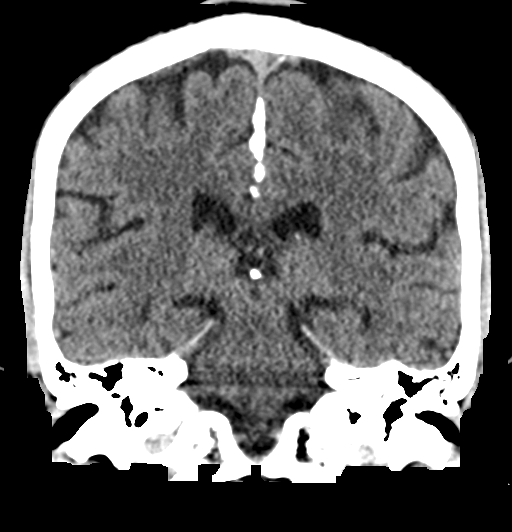

[Series 6: sagittal soft tissue · sagittal · 0.31mm/px · 3 of 52 slices shown]
[im 18/52  brain]
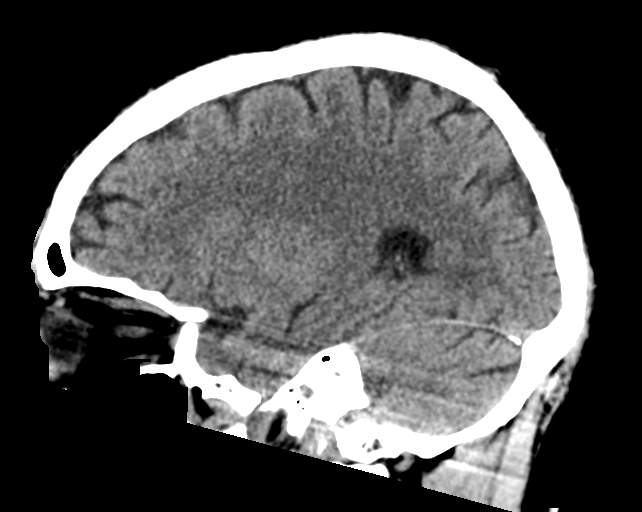
[im 26/52  brain]
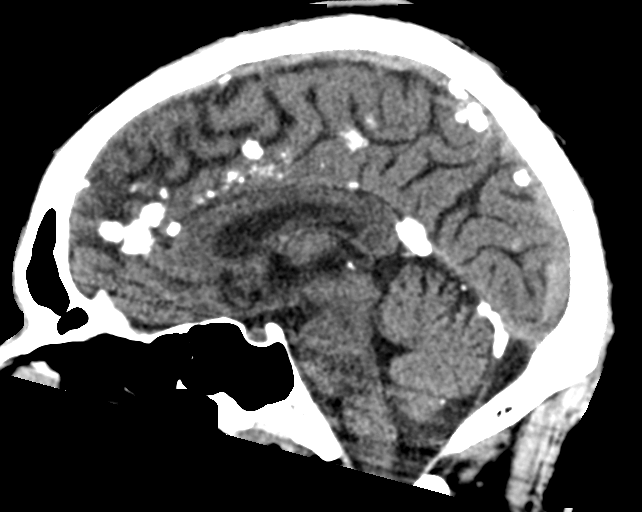
[im 35/52  brain]
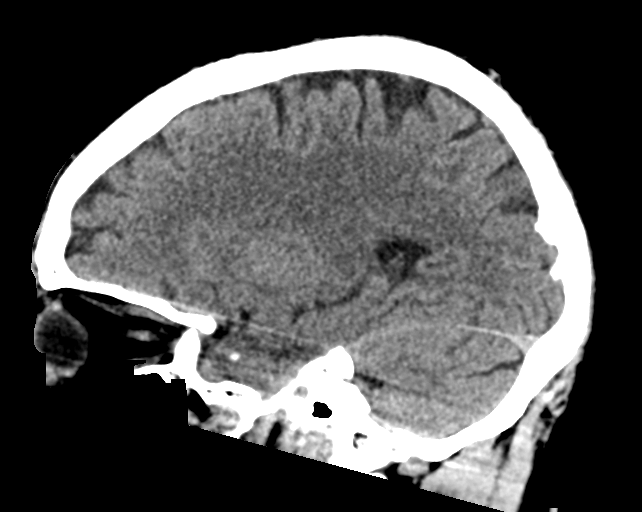

[17 of 47 positions shown; findings below may reference images not displayed]

FINDINGS: Brain: No evidence of acute infarction, hemorrhage, extra-axial
collection, ventriculomegaly, or mass effect. Generalized cerebral
atrophy. Periventricular white matter low attenuation likely
secondary to microangiopathy.

Vascular: Cerebrovascular atherosclerotic calcifications are noted.

Skull: Negative for fracture or focal lesion.

Sinuses/Orbits: Visualized portions of the orbits are unremarkable.
Visualized portions of the paranasal sinuses are unremarkable.
Visualized portions of the mastoid air cells are unremarkable.

Other: None.
IMPRESSION: 1. No acute intracranial pathology.
2. Chronic small vessel ischemic changes.

## 2021-05-07 IMAGING — CT CT ANGIO CHEST-ABD-PELV FOR DISSECTION W/ AND WO/W CM
2 of 7 series · 10 of 36 positions shown, 15 images · non-contrast
Comparison: [DATE]

CLINICAL DATA: Nausea, vomiting, diarrhea, hypertension

EXAM:
CT ANGIOGRAPHY CHEST, ABDOMEN AND PELVIS
TECHNIQUE: Non-contrast CT of the chest was initially obtained.

[Series 6: axial arterial · axial · arterial · 0.82mm/px · z∈[-857,-275]mm · 9 of 230 slices shown, 13 images]
[im 18/230  mediastinal]
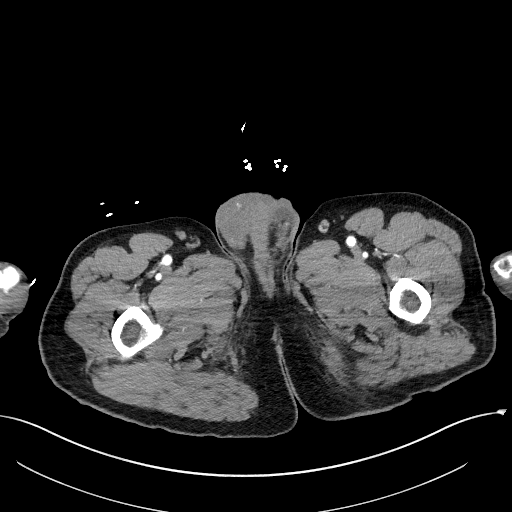
[im 18/230  bone]
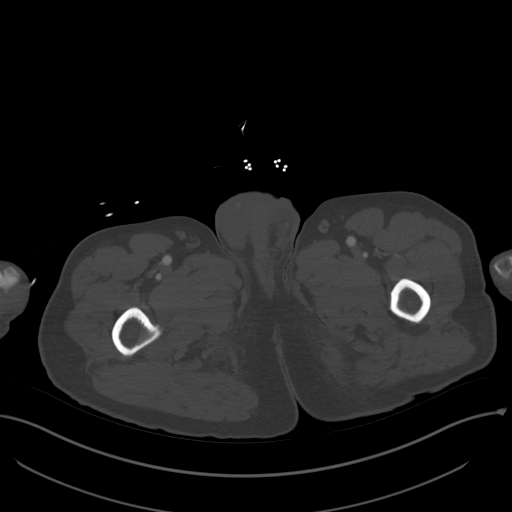
[im 53/230  mediastinal]
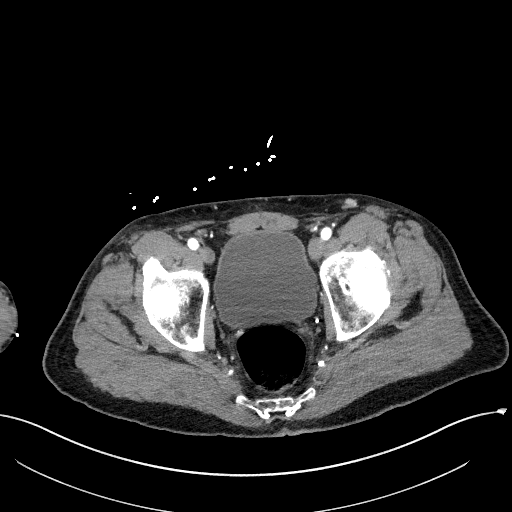
[im 71/230  mediastinal]
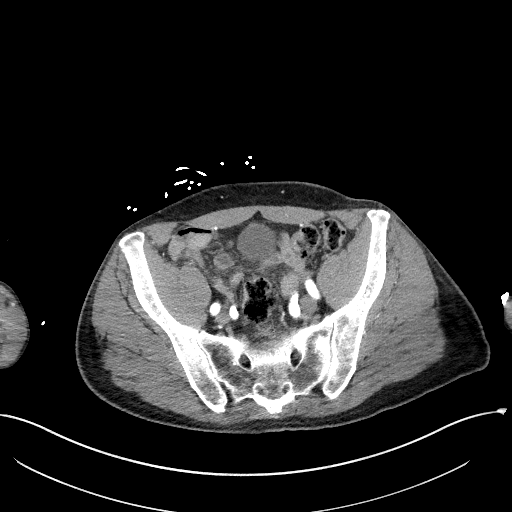
[im 106/230  mediastinal]
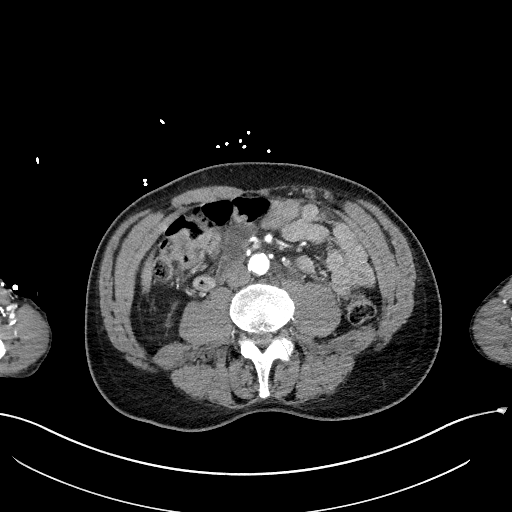
[im 124/230  mediastinal]
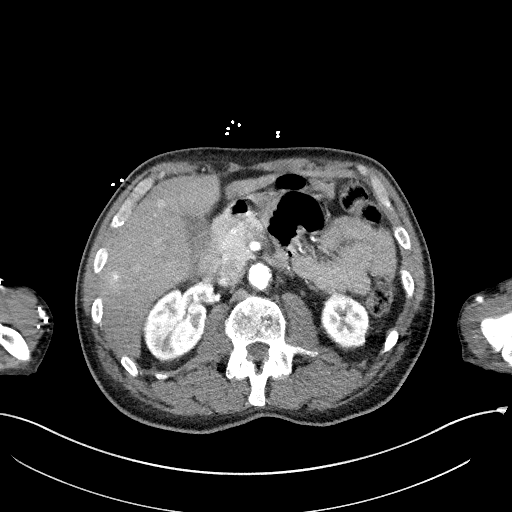
[im 159/230  mediastinal]
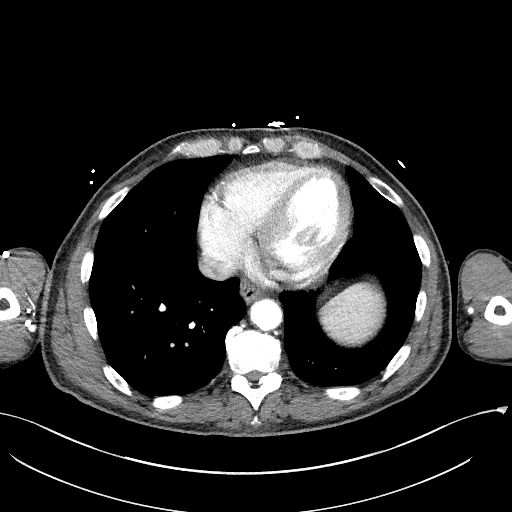
[im 159/230  lung]
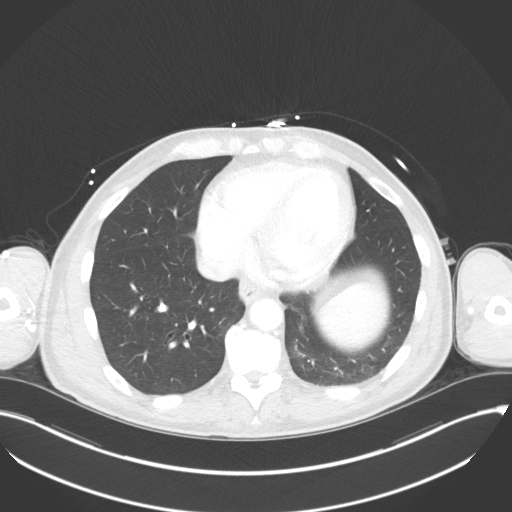
[im 177/230  mediastinal]
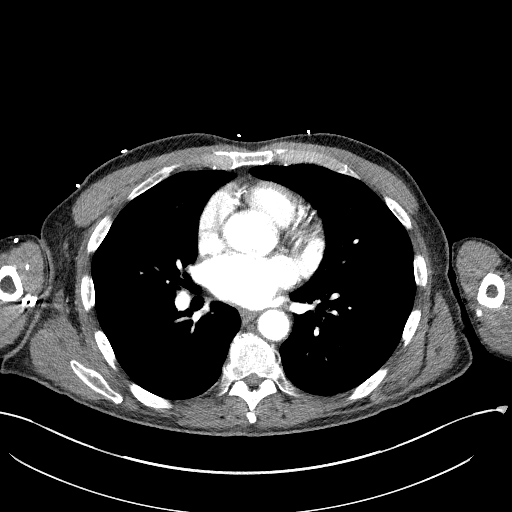
[im 177/230  lung]
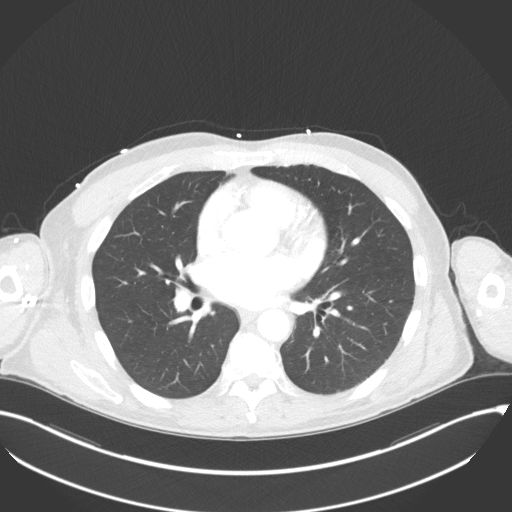
[im 194/230  lung]
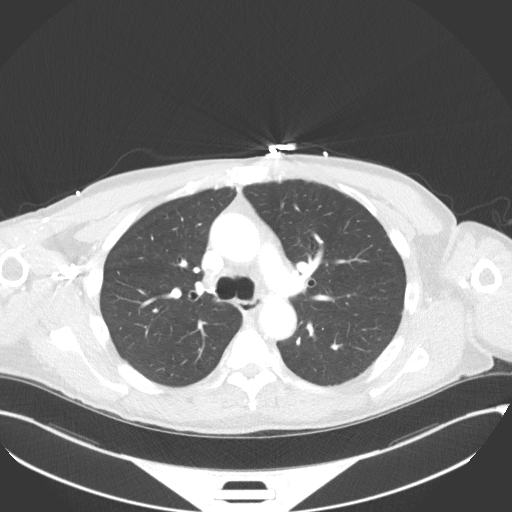
[im 212/230  mediastinal]
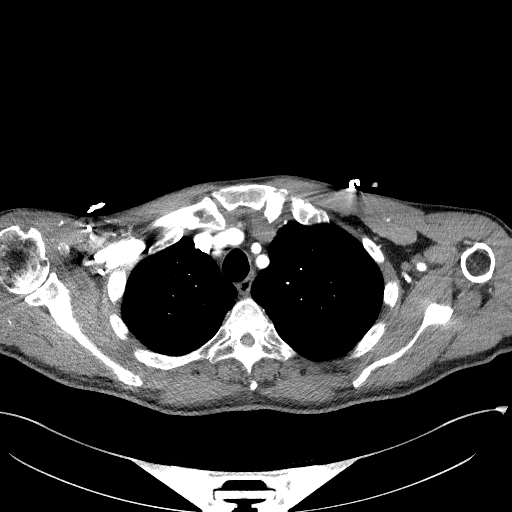
[im 212/230  lung]
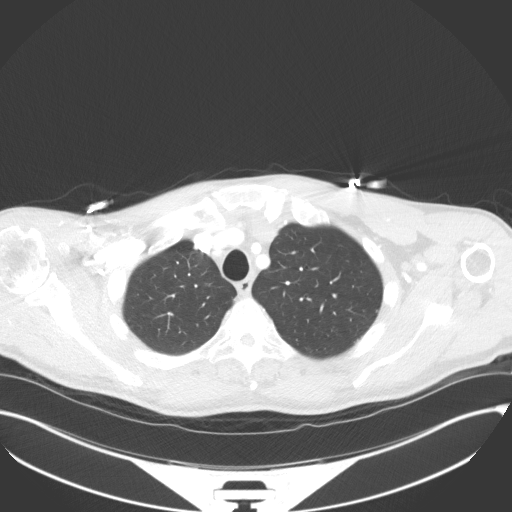

[Series 9: coronals · coronal · 0.72mm/px · 1 of 131 slices shown, 2 images]
[im 66/131  mediastinal]
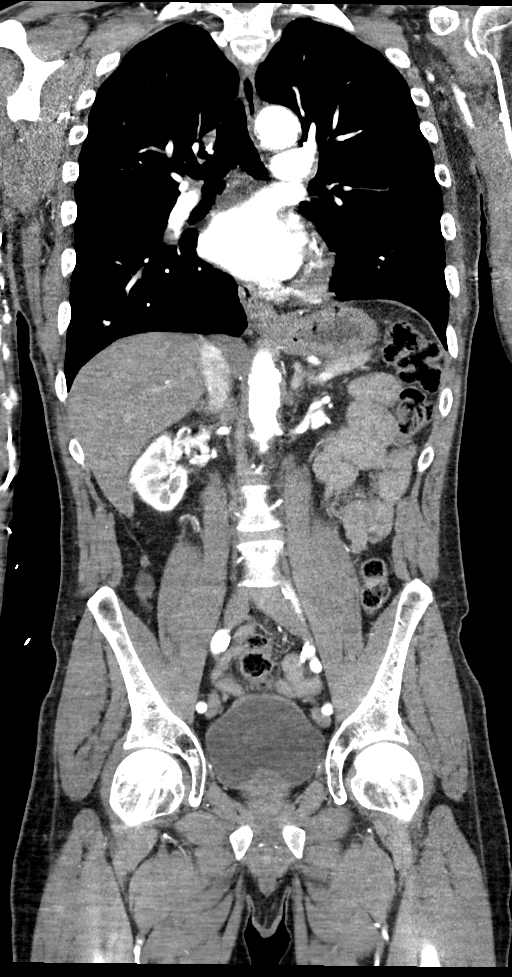
[im 66/131  bone]
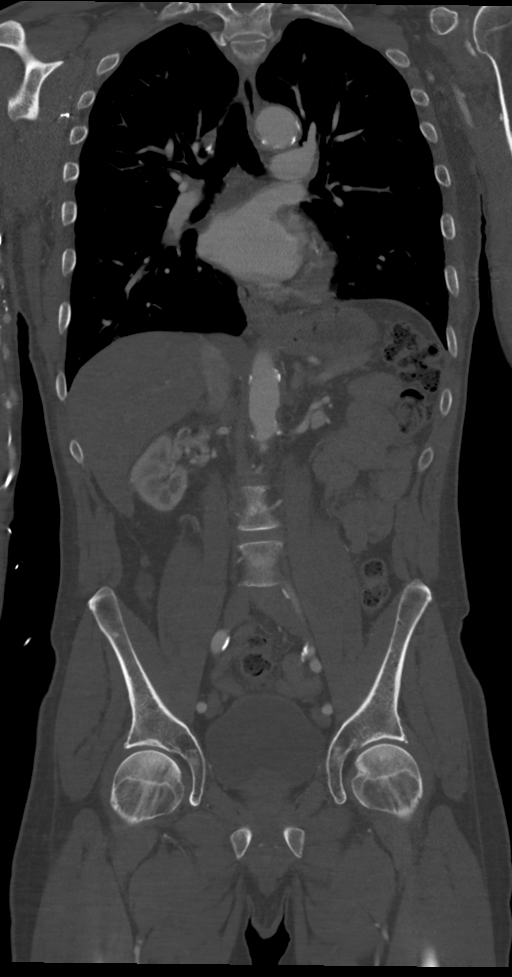

[10 of 36 positions shown; findings below may reference images not displayed]

Multidetector CT imaging through the chest, abdomen and pelvis was
performed using the standard protocol during bolus administration of
intravenous contrast. Multiplanar reconstructed images and MIPs were
obtained and reviewed to evaluate the vascular anatomy.

RADIATION DOSE REDUCTION: This exam was performed according to the
departmental dose-optimization program which includes automated
exposure control, adjustment of the mA and/or kV according to
patient size and/or use of iterative reconstruction technique.

CONTRAST:  100mL OMNIPAQUE IOHEXOL 350 MG/ML SOLN
FINDINGS: CTA CHEST FINDINGS

Cardiovascular: Preferential opacification of the thoracic aorta. No
evidence of thoracic aortic aneurysm or dissection. Normal heart
size. No pericardial effusion. Thoracic aortic atherosclerosis.
Coronary artery atherosclerosis.

Mediastinum/Nodes: No enlarged mediastinal, hilar, or axillary lymph
nodes. Thyroid gland, trachea, and esophagus demonstrate no
significant findings.

Lungs/Pleura: Lungs are clear. No pleural effusion or pneumothorax.
Mild right basilar scarring.

Musculoskeletal: No chest wall abnormality. No acute or significant
osseous findings.

Review of the MIP images confirms the above findings.

CTA ABDOMEN AND PELVIS FINDINGS

VASCULAR

Aorta: Normal caliber aorta without aneurysm, dissection, vasculitis
or significant stenosis. Abdominal aortic atherosclerosis.

Celiac: Patent without evidence of aneurysm, dissection, vasculitis
or significant stenosis. Atherosclerotic plaque at the origin of the
celiac artery.

SMA: Patent without evidence of aneurysm, dissection, vasculitis or
significant stenosis. Atherosclerotic plaque at the origin of the
SMA.

Renals: Both renal arteries are patent without evidence of aneurysm,
dissection, vasculitis, fibromuscular dysplasia or significant
stenosis. Atherosclerotic plaque at the origin of the renal
arteries.

IMA: Patent without evidence of aneurysm, dissection, vasculitis or
significant stenosis.

Inflow: Patent without evidence of aneurysm, dissection, vasculitis
or significant stenosis. Atherosclerotic plaque in the left common
femoral artery with less than 50% stenosis.

Veins: No obvious venous abnormality within the limitations of this
arterial phase study.

Review of the MIP images confirms the above findings.

NON-VASCULAR

Hepatobiliary: No focal liver abnormality is seen. No gallstones,
gallbladder wall thickening, or biliary dilatation.

Pancreas: Unremarkable. No pancreatic ductal dilatation or
surrounding inflammatory changes.

Spleen: Normal in size without focal abnormality.

Adrenals/Urinary Tract: Adrenal glands are unremarkable. Kidneys are
normal, without renal calculi, focal lesion, or hydronephrosis.
Bladder is unremarkable.

Stomach/Bowel: Stomach is within normal limits. Appendix appears
normal. No evidence of bowel wall thickening, distention, or
inflammatory changes.

Lymphatic: No lymphadenopathy.

Reproductive: Prostate is unremarkable.

Other: No abdominal wall hernia or abnormality. No abdominopelvic
ascites.

Musculoskeletal: No acute or significant osseous findings.

Review of the MIP images confirms the above findings.
IMPRESSION: 1. No evidence of thoracic or abdominal aortic aneurysm or
dissection.
2. Aortic Atherosclerosis ([5R]-[5R]).
3. Coronary artery atherosclerosis.

## 2021-05-07 MED ORDER — ONDANSETRON HCL 4 MG PO TABS: 4.0000 mg | ORAL_TABLET | Freq: Three times a day (TID) | ORAL | 0 refills | Status: DC | PRN

## 2021-05-07 MED ORDER — LISINOPRIL 5 MG PO TABS
2.5000 mg | ORAL_TABLET | Freq: Once | ORAL | Status: AC
Start: 2021-05-07 — End: 2021-05-07
  Administered 2021-05-07: 2.5 mg via ORAL
  Filled 2021-05-07: qty 1

## 2021-05-07 MED ORDER — ONDANSETRON HCL 4 MG/2ML IJ SOLN
4.0000 mg | Freq: Once | INTRAMUSCULAR | Status: AC
Start: 2021-05-07 — End: 2021-05-07
  Administered 2021-05-07: 4 mg via INTRAVENOUS
  Filled 2021-05-07: qty 2

## 2021-05-07 MED ORDER — AMLODIPINE BESYLATE 5 MG PO TABS
5.0000 mg | ORAL_TABLET | Freq: Once | ORAL | Status: AC
  Administered 2021-05-07: 5 mg via ORAL
  Filled 2021-05-07: qty 1

## 2021-05-07 MED ORDER — LABETALOL HCL 5 MG/ML IV SOLN
10.0000 mg | Freq: Once | INTRAVENOUS | Status: AC
  Administered 2021-05-07: 10 mg via INTRAVENOUS
  Filled 2021-05-07: qty 4

## 2021-05-07 MED ORDER — INSULIN ASPART 100 UNIT/ML IJ SOLN
10.0000 [IU] | Freq: Once | INTRAMUSCULAR | Status: AC
  Administered 2021-05-07: 10 [IU] via SUBCUTANEOUS
  Filled 2021-05-07: qty 1

## 2021-05-07 MED ORDER — IOHEXOL 350 MG/ML SOLN
100.0000 mL | Freq: Once | INTRAVENOUS | Status: AC | PRN
  Administered 2021-05-07: 100 mL via INTRAVENOUS

## 2021-05-07 MED ORDER — SODIUM CHLORIDE 0.9 % IV BOLUS
500.0000 mL | Freq: Once | INTRAVENOUS | Status: AC
  Administered 2021-05-07: 500 mL via INTRAVENOUS

## 2021-05-07 MED ORDER — MORPHINE SULFATE (PF) 4 MG/ML IV SOLN
4.0000 mg | INTRAVENOUS | Status: DC | PRN
  Administered 2021-05-07: 4 mg via INTRAVENOUS
  Filled 2021-05-07: qty 1

## 2021-05-07 NOTE — ED Triage Notes (Signed)
Report per EMS,  Reports nausea, vomiting, and diarrhea since this morning. Vomited twice. Hx hypertension, diabetes, blood cancer

## 2021-05-07 NOTE — Progress Notes (Signed)
Inpatient Diabetes Program Recommendations  AACE/ADA: New Consensus Statement on Inpatient Glycemic Control   Target Ranges:  Prepandial:   less than 140 mg/dL      Peak postprandial:   less than 180 mg/dL (1-2 hours)      Critically ill patients:  140 - 180 mg/dL    Latest Reference Range & Units 05/07/21 07:14  Glucose-Capillary 70 - 99 mg/dL 322 (H)    Latest Reference Range & Units 05/07/21 06:59  CO2 22 - 32 mmol/L 24  Glucose 70 - 99 mg/dL 329 (H)  Anion gap 5 - 15  8   Review of Glycemic Control  Diabetes history: DM1 Outpatient Diabetes medications: Tresiba 15 units daily, Novolog 14 units TID with meals Current orders for Inpatient glycemic control: None; in ED  Inpatient Diabetes Program Recommendations:    Insulin: If admitted, please consider ordering Semglee 15 units daily, CBGs AC&HS (or Q4H if NPO), Novolog 0-9 units AC&HS (or Q4H if NPO), and if ordered diet Novolog 4 units TID with meals for meal coverage if patient eats at least 50% of meals.  NOTE: Per chart, patient has Type 1 DM. Noted patient seen Dr. Gabriel Carina (Endocrinologist) for initial consult on 04/15/21. Per office note on 04/15/21, " He takes [name] U100 20 units each morning and NovoLog U100 SSI correction only approximately 2 units per 20 mg/dl that blood glucose is over 200 mg/dl.Last Hb A1c on 12/09/2020 was 12%. Patient instructed to Continue Tresiba 20 units daily.  - Counseled him to take NovoLog before eating: 2 units per "fistfull" of carbohydrates. Also ok to continue with his SSI for hyperglycemia. Discussed why we dose NovoLog before eating carbs." Noted labs from 04/15/21 on note by Dr. Gabriel Carina, GAD-65 11.8 U/mL, c-peptide <0.1 ng/mL, and A1C 9.5 %.  Thanks, Nicholas Alderman, RN, MSN, CDE Diabetes Coordinator Inpatient Diabetes Program [phone] (Team Pager from 8am to 5pm)

## 2021-05-07 NOTE — ED Notes (Signed)
Follow up pcp info provided all questions answered  

## 2021-05-07 NOTE — ED Provider Notes (Addendum)
[facility] Provider Note    Event Date/Time   First MD Initiated Contact with Patient 05/07/21 [phone]     (approximate)   History   Vomiting   HPI  Nicholas Weiss is a 71 y.o. male with a history of poorly controlled type 1 diabetes presents to the ER for evaluation of epigastric pain nausea vomiting.  Patient is ill-appearing.  Does not provide much additional history.  States he feels short of breath.  Denies any pain ripping or tearing through to his back.  No diarrhea.  Has not noted any bloody stools.  No sick contacts.  Denies any chest pain at this time.     Physical Exam   Triage Vital Signs: ED Triage Vitals  Enc Vitals Group     BP      Pulse      Resp      Temp      Temp src      SpO2      Weight      Height      Head Circumference      Peak Flow      Pain Score      Pain Loc      Pain Edu?      Excl. in [place]?     Most recent vital signs: Vitals:   05/07/21 1109 05/07/21 1141  BP: 112/82 115/77  Pulse: 85 78  Resp:  14  Temp:  98.4 F (36.9 C)  SpO2: 97% 100%     Constitutional: Alert, ill appearing Eyes: Conjunctivae are normal.  Head: Atraumatic. Nose: No congestion/rhinnorhea. Mouth/Throat: Mucous membranes are moist.   Neck: Painless ROM.  Cardiovascular:   Good peripheral circulation. Respiratory: Normal respiratory effort.  No retractions.  Gastrointestinal: Soft and nontender.  Musculoskeletal:  no deformity Neurologic:  MAE spontaneously. No gross focal neurologic deficits are appreciated.  Skin:  Skin is warm, dry and intact. No rash noted. Psychiatric: Mood and affect are normal. Speech and behavior are normal.    ED Results / Procedures / Treatments   Labs (all labs ordered are listed, but only abnormal results are displayed) Labs Reviewed  CBC - Abnormal; Notable for the following components:      Result Value   RBC 3.59 (*)    Hemoglobin 11.1 (*)    HCT 34.6 (*)    All other components within  normal limits  COMPREHENSIVE METABOLIC PANEL - Abnormal; Notable for the following components:   Sodium 131 (*)    Glucose, Bld 329 (*)    BUN 29 (*)    Creatinine, Ser 1.51 (*)    Calcium 8.5 (*)    Total Protein 10.6 (*)    Albumin 3.3 (*)    GFR, Estimated 50 (*)    All other components within normal limits  BLOOD GAS, VENOUS - Abnormal; Notable for the following components:   pH, Ven 7.46 (*)    pCO2, Ven 40 (*)    pO2, Ven <31.0 (*)    Bicarbonate 28.4 (*)    Acid-Base Excess 4.2 (*)    All other components within normal limits  URINALYSIS, COMPLETE (UACMP) WITH MICROSCOPIC - Abnormal; Notable for the following components:   Color, Urine STRAW (*)    APPearance CLEAR (*)    Glucose, UA >=500 (*)    Hgb urine dipstick SMALL (*)    Protein, ur 30 (*)    All other components within normal limits  URINE DRUG  SCREEN, QUALITATIVE ([facility] ONLY) - Abnormal; Notable for the following components:   Cannabinoid 50 Ng, Ur [place] POSITIVE (*)    All other components within normal limits  CBG MONITORING, ED - Abnormal; Notable for the following components:   Glucose-Capillary 322 (*)    All other components within normal limits  CBG MONITORING, ED - Abnormal; Notable for the following components:   Glucose-Capillary 187 (*)    All other components within normal limits  TROPONIN I (HIGH SENSITIVITY) - Abnormal; Notable for the following components:   Troponin I (High Sensitivity) 19 (*)    All other components within normal limits  LACTIC ACID, PLASMA  ETHANOL  LIPASE, BLOOD  CBG MONITORING, ED  TROPONIN I (HIGH SENSITIVITY)     EKG  ED ECG REPORT I, Merlyn Lot, the attending physician, personally viewed and interpreted this ECG.   Date: 05/07/2021  EKG Time: 6:58  Rate: 90  Rhythm: sinus  Axis: normal  Intervals: normal  ST&T Change: poor r wave progression    RADIOLOGY Please see ED Course for my review and interpretation.  I personally reviewed all radiographic  images ordered to evaluate for the above acute complaints and reviewed radiology reports and findings.  These findings were personally discussed with the patient.  Please see medical record for radiology report.    PROCEDURES:  Critical Care performed: No  Procedures   MEDICATIONS ORDERED IN ED: Medications  morphine (PF) 4 MG/ML injection 4 mg (4 mg Intravenous Given 05/07/21 0705)  ondansetron (ZOFRAN) injection 4 mg (4 mg Intravenous Given 05/07/21 0705)  sodium chloride 0.9 % bolus 500 mL (0 mLs Intravenous Stopped 05/07/21 0826)  iohexol (OMNIPAQUE) 350 MG/ML injection 100 mL (100 mLs Intravenous Contrast Given 05/07/21 0819)  insulin aspart (novoLOG) injection 10 Units (10 Units Subcutaneous Given 05/07/21 0856)  labetalol (NORMODYNE) injection 10 mg (10 mg Intravenous Given 05/07/21 1031)  lisinopril (ZESTRIL) tablet 2.5 mg (2.5 mg Oral Given 05/07/21 1045)  amLODipine (NORVASC) tablet 5 mg (5 mg Oral Given 05/07/21 1045)     IMPRESSION / MDM / ASSESSMENT AND PLAN / ED COURSE  I reviewed the triage vital signs and the nursing notes.                              Differential diagnosis includes, but is not limited to, dka, electrolyte abn, sepsis, acs, dissection, pna, enteritis, gastritic, pancreatitis, biliary pathology  Patient presenting with nausea and epigastric discomfort as described above.  He is protecting his airway does appear to have coo small respirations therefore have a high suspicion for DKA.  He is hypertensive but denies any pain ripping or tearing through to his back.  Has a history of CKD.  Will order blood work will order IV fluids as well as IV morphine and IV Zofran and reassess.  Given his hypertension and hyperglycemia, may require hospitalization.  The patient will be placed on continuous pulse oximetry and telemetry for monitoring.  Laboratory evaluation will be sent to evaluate for the above complaints.     Clinical Course as of 05/07/21 1337  Wed May 07, 2021   0716 Chest x-ray by my review does not show any evidence of pneumothorax. [PR]  1937 VBG with respiratory alkalosis [PR]  0833 CTA chest abdomen pelvis by my review does not show any evidence of acute dissection.  Will await formal radiology report.  CT head by my review does not show any evidence  of large subdural or bleed.  Will await formal radiology report [PR]  [phone] Patient does admit to drinking alcohol last night. [PR]  0850 Patient's UDS is cannabinol positive.  CT imaging per radiology is reassuring without acute abnormality.  We will continue observe we will give insulin to bring blood sugar down but no findings of DKA. [PR]  1042 Patient states that he is now feeling much better.  Lactate normal.  Possible cannabinoid related presentation [PR]  1042  states he did not take his morning medications.  Will reorder as likely explains his hyperglycemia and hypertension. [PR]  1200 BP significantly improved. [PR]  7867 CBG improved. [PR]  1334 Patient reassessed.  His work-up in the ER has been reassuring he is hemodynamically stable blood pressure and vitals improved after taking his home medications.  He is tolerating pO.  States that he feels well and feels comfortable being discharged at this point.  Given that he is cleared for outpatient follow-up.  We discussed signs and symptoms which she should return to the ER. [PR]    Clinical Course User Index [PR] Merlyn Lot, MD     FINAL CLINICAL IMPRESSION(S) / ED DIAGNOSES   Final diagnoses:  Nausea and vomiting, unspecified vomiting type     Rx / DC Orders   ED Discharge Orders          Ordered    ondansetron (ZOFRAN) 4 MG tablet  Every 8 hours PRN        05/07/21 1335             Note:  This document was prepared using Dragon voice recognition software and may include unintentional dictation errors.      Merlyn Lot, MD 05/07/21 [phone]

## 2021-05-08 DIAGNOSIS — E785 Hyperlipidemia, unspecified: Secondary | ICD-10-CM | POA: Insufficient documentation

## 2021-05-13 ENCOUNTER — Ambulatory Visit: Payer: Medicare (Managed Care) | Admitting: *Deleted

## 2021-05-13 ENCOUNTER — Inpatient Hospital Stay: Payer: Medicare (Managed Care)

## 2021-05-13 ENCOUNTER — Inpatient Hospital Stay: Payer: Medicare (Managed Care) | Attending: Oncology

## 2021-06-03 ENCOUNTER — Inpatient Hospital Stay: Payer: Medicare (Managed Care)

## 2021-06-04 ENCOUNTER — Telehealth: Payer: Self-pay | Admitting: *Deleted

## 2021-06-04 NOTE — Telephone Encounter (Signed)
Patient needs to reschedule missed appointments from 06/03/2021 ?

## 2021-06-05 ENCOUNTER — Other Ambulatory Visit: Payer: Self-pay

## 2021-06-05 ENCOUNTER — Inpatient Hospital Stay: Payer: Medicare (Managed Care)

## 2021-06-05 ENCOUNTER — Inpatient Hospital Stay: Payer: Medicare (Managed Care) | Attending: Oncology

## 2021-06-05 VITALS — BP 155/85 | HR 81

## 2021-06-05 DIAGNOSIS — N1832 Chronic kidney disease, stage 3b: Secondary | ICD-10-CM | POA: Diagnosis not present

## 2021-06-05 DIAGNOSIS — D631 Anemia in chronic kidney disease: Secondary | ICD-10-CM

## 2021-06-05 DIAGNOSIS — N183 Chronic kidney disease, stage 3 unspecified: Secondary | ICD-10-CM

## 2021-06-05 LAB — HEMOGLOBIN AND HEMATOCRIT, BLOOD
HCT: 28.5 % — ABNORMAL LOW (ref 39.0–52.0)
Hemoglobin: 9.3 g/dL — ABNORMAL LOW (ref 13.0–17.0)

## 2021-06-05 MED ORDER — EPOETIN ALFA-EPBX 40000 UNIT/ML IJ SOLN
40000.0000 [IU] | Freq: Once | INTRAMUSCULAR | Status: AC
  Administered 2021-06-05: 15:00:00 40000 [IU] via SUBCUTANEOUS
  Filled 2021-06-05: qty 1

## 2021-06-10 ENCOUNTER — Inpatient Hospital Stay: Payer: Medicare (Managed Care)

## 2021-06-11 ENCOUNTER — Encounter: Payer: Self-pay | Admitting: Oncology

## 2021-06-24 ENCOUNTER — Inpatient Hospital Stay: Payer: Medicare (Managed Care)

## 2021-06-24 ENCOUNTER — Inpatient Hospital Stay: Payer: Medicare (Managed Care) | Admitting: Oncology

## 2021-07-28 ENCOUNTER — Telehealth: Payer: Self-pay | Admitting: Oncology

## 2021-07-28 NOTE — Telephone Encounter (Signed)
pt called in to have misse appts 3/28 r/s .Nicholas Weiss  ?

## 2021-08-05 DIAGNOSIS — K769 Liver disease, unspecified: Secondary | ICD-10-CM | POA: Insufficient documentation

## 2021-08-05 DIAGNOSIS — R1084 Generalized abdominal pain: Secondary | ICD-10-CM | POA: Insufficient documentation

## 2021-08-06 ENCOUNTER — Inpatient Hospital Stay: Payer: Medicare (Managed Care)

## 2021-08-06 ENCOUNTER — Inpatient Hospital Stay: Payer: Medicare (Managed Care) | Admitting: Oncology

## 2021-08-06 ENCOUNTER — Telehealth: Payer: Self-pay | Admitting: Oncology

## 2021-08-06 NOTE — Telephone Encounter (Signed)
pt partner called, pt will not be able to make appt he is in the hospital...KJ needs to r/s ?

## 2021-08-09 ENCOUNTER — Encounter: Payer: Self-pay | Admitting: Radiology

## 2021-08-09 ENCOUNTER — Observation Stay: Payer: Medicare (Managed Care)

## 2021-08-09 ENCOUNTER — Observation Stay
Admission: EM | Admit: 2021-08-09 | Discharge: 2021-08-11 | Disposition: A | Discharge status: Home / Self Care | Payer: Medicare (Managed Care) | Attending: Family Medicine | Admitting: Family Medicine

## 2021-08-09 DIAGNOSIS — D638 Anemia in other chronic diseases classified elsewhere: Secondary | ICD-10-CM | POA: Diagnosis present

## 2021-08-09 DIAGNOSIS — Z794 Long term (current) use of insulin: Secondary | ICD-10-CM | POA: Insufficient documentation

## 2021-08-09 DIAGNOSIS — N179 Acute kidney failure, unspecified: Secondary | ICD-10-CM | POA: Insufficient documentation

## 2021-08-09 DIAGNOSIS — I639 Cerebral infarction, unspecified: Principal | ICD-10-CM | POA: Insufficient documentation

## 2021-08-09 DIAGNOSIS — I129 Hypertensive chronic kidney disease with stage 1 through stage 4 chronic kidney disease, or unspecified chronic kidney disease: Secondary | ICD-10-CM | POA: Diagnosis not present

## 2021-08-09 DIAGNOSIS — E1122 Type 2 diabetes mellitus with diabetic chronic kidney disease: Secondary | ICD-10-CM | POA: Diagnosis not present

## 2021-08-09 DIAGNOSIS — N170 Acute kidney failure with tubular necrosis: Secondary | ICD-10-CM | POA: Diagnosis present

## 2021-08-09 DIAGNOSIS — N1831 Chronic kidney disease, stage 3a: Secondary | ICD-10-CM | POA: Insufficient documentation

## 2021-08-09 DIAGNOSIS — E1140 Type 2 diabetes mellitus with diabetic neuropathy, unspecified: Secondary | ICD-10-CM | POA: Insufficient documentation

## 2021-08-09 DIAGNOSIS — Z79899 Other long term (current) drug therapy: Secondary | ICD-10-CM | POA: Insufficient documentation

## 2021-08-09 DIAGNOSIS — U071 COVID-19: Secondary | ICD-10-CM | POA: Diagnosis not present

## 2021-08-09 DIAGNOSIS — I10 Essential (primary) hypertension: Secondary | ICD-10-CM | POA: Diagnosis present

## 2021-08-09 DIAGNOSIS — I440 Atrioventricular block, first degree: Secondary | ICD-10-CM | POA: Diagnosis not present

## 2021-08-09 DIAGNOSIS — R55 Syncope and collapse: Secondary | ICD-10-CM | POA: Diagnosis not present

## 2021-08-09 DIAGNOSIS — N200 Calculus of kidney: Secondary | ICD-10-CM | POA: Diagnosis not present

## 2021-08-09 DIAGNOSIS — K279 Peptic ulcer, site unspecified, unspecified as acute or chronic, without hemorrhage or perforation: Secondary | ICD-10-CM | POA: Diagnosis present

## 2021-08-09 DIAGNOSIS — D631 Anemia in chronic kidney disease: Secondary | ICD-10-CM | POA: Insufficient documentation

## 2021-08-09 DIAGNOSIS — E1040 Type 1 diabetes mellitus with diabetic neuropathy, unspecified: Secondary | ICD-10-CM | POA: Diagnosis present

## 2021-08-09 DIAGNOSIS — E860 Dehydration: Secondary | ICD-10-CM

## 2021-08-09 DIAGNOSIS — N183 Chronic kidney disease, stage 3 unspecified: Secondary | ICD-10-CM | POA: Diagnosis present

## 2021-08-09 LAB — CBC
HCT: 31.9 % — ABNORMAL LOW (ref 39.0–52.0)
Hemoglobin: 10.2 g/dL — ABNORMAL LOW (ref 13.0–17.0)
MCH: 31 pg (ref 26.0–34.0)
MCHC: 32 g/dL (ref 30.0–36.0)
MCV: 97 fL (ref 80.0–100.0)
Platelets: 163 10*3/uL (ref 150–400)
RBC: 3.29 MIL/uL — ABNORMAL LOW (ref 4.22–5.81)
RDW: 13.2 % (ref 11.5–15.5)
WBC: 3.2 10*3/uL — ABNORMAL LOW (ref 4.0–10.5)
nRBC: 0 % (ref 0.0–0.2)

## 2021-08-09 LAB — CBC WITH DIFFERENTIAL/PLATELET
Abs Immature Granulocytes: 0 10*3/uL (ref 0.00–0.07)
Basophils Absolute: 0 10*3/uL (ref 0.0–0.1)
Basophils Relative: 0 %
Eosinophils Absolute: 0 10*3/uL (ref 0.0–0.5)
Eosinophils Relative: 1 %
HCT: 27.4 % — ABNORMAL LOW (ref 39.0–52.0)
Hemoglobin: 8.9 g/dL — ABNORMAL LOW (ref 13.0–17.0)
Immature Granulocytes: 0 %
Lymphocytes Relative: 39 %
Lymphs Abs: 1 10*3/uL (ref 0.7–4.0)
MCH: 31.6 pg (ref 26.0–34.0)
MCHC: 32.5 g/dL (ref 30.0–36.0)
MCV: 97.2 fL (ref 80.0–100.0)
Monocytes Absolute: 0.3 10*3/uL (ref 0.1–1.0)
Monocytes Relative: 12 %
Neutro Abs: 1.2 10*3/uL — ABNORMAL LOW (ref 1.7–7.7)
Neutrophils Relative %: 48 %
Platelets: 170 10*3/uL (ref 150–400)
RBC: 2.82 MIL/uL — ABNORMAL LOW (ref 4.22–5.81)
RDW: 13.3 % (ref 11.5–15.5)
WBC: 2.5 10*3/uL — ABNORMAL LOW (ref 4.0–10.5)
nRBC: 0 % (ref 0.0–0.2)

## 2021-08-09 LAB — COMPREHENSIVE METABOLIC PANEL
ALT: 13 U/L (ref 0–44)
AST: 15 U/L (ref 15–41)
Albumin: 3 g/dL — ABNORMAL LOW (ref 3.5–5.0)
Alkaline Phosphatase: 41 U/L (ref 38–126)
Anion gap: 7 (ref 5–15)
BUN: 39 mg/dL — ABNORMAL HIGH (ref 8–23)
CO2: 26 mmol/L (ref 22–32)
Calcium: 8.6 mg/dL — ABNORMAL LOW (ref 8.9–10.3)
Chloride: 104 mmol/L (ref 98–111)
Creatinine, Ser: 2.46 mg/dL — ABNORMAL HIGH (ref 0.61–1.24)
GFR, Estimated: 28 mL/min — ABNORMAL LOW (ref >60)
Glucose, Bld: 78 mg/dL (ref 70–99)
Potassium: 4.1 mmol/L (ref 3.5–5.1)
Sodium: 137 mmol/L (ref 135–145)
Total Bilirubin: 0.5 mg/dL (ref 0.3–1.2)
Total Protein: 8.6 g/dL — ABNORMAL HIGH (ref 6.5–8.1)

## 2021-08-09 LAB — TSH: TSH: 0.34 u[IU]/mL — ABNORMAL LOW (ref 0.350–4.500)

## 2021-08-09 LAB — LACTIC ACID, PLASMA
Lactic Acid, Venous: 1.4 mmol/L (ref 0.5–1.9)
Lactic Acid, Venous: 0.8 mmol/L (ref 0.5–1.9)

## 2021-08-09 LAB — CREATININE, SERUM
Creatinine, Ser: 2.01 mg/dL — ABNORMAL HIGH (ref 0.61–1.24)
GFR, Estimated: 35 mL/min — ABNORMAL LOW (ref >60)

## 2021-08-09 LAB — T4, FREE: Free T4: 0.9 ng/dL (ref 0.61–1.12)

## 2021-08-09 LAB — LIPASE, BLOOD: Lipase: 40 U/L (ref 11–51)

## 2021-08-09 LAB — CK: Total CK: 80 U/L (ref 49–397)

## 2021-08-09 LAB — TROPONIN I (HIGH SENSITIVITY): Troponin I (High Sensitivity): 23 ng/L — ABNORMAL HIGH (ref <18)

## 2021-08-09 LAB — MAGNESIUM: Magnesium: 2.1 mg/dL (ref 1.7–2.4)

## 2021-08-09 IMAGING — CR DG CHEST 2V
1 series · 2 of 2 positions shown · non-contrast
Comparison: [DATE]

CLINICAL DATA: Syncopal episode.

EXAM:
CHEST - 2 VIEW

[Series 1: dg chest 2 view · 0.14mm/px · 2 of 2 slices shown]
[im 1/2]
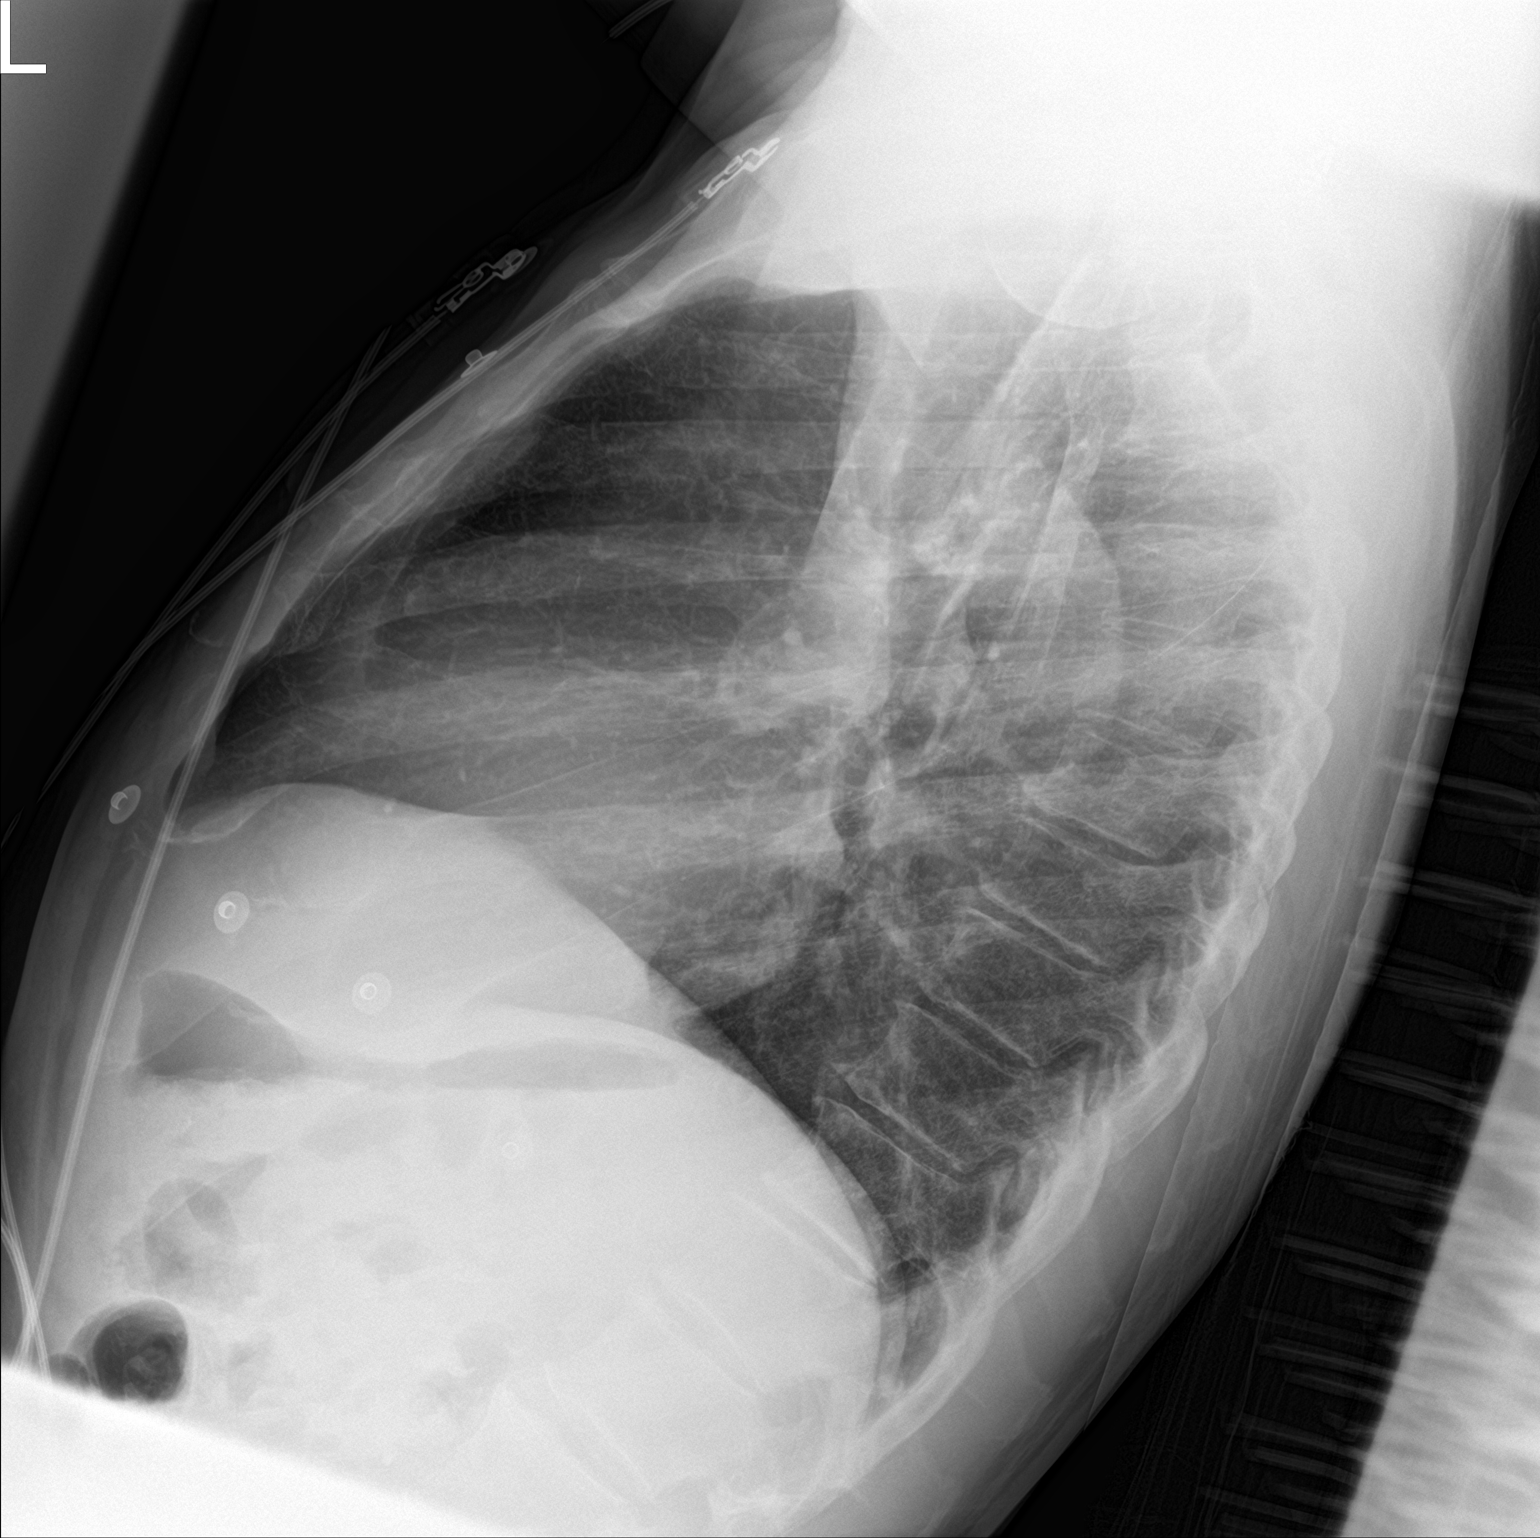
[im 2/2]
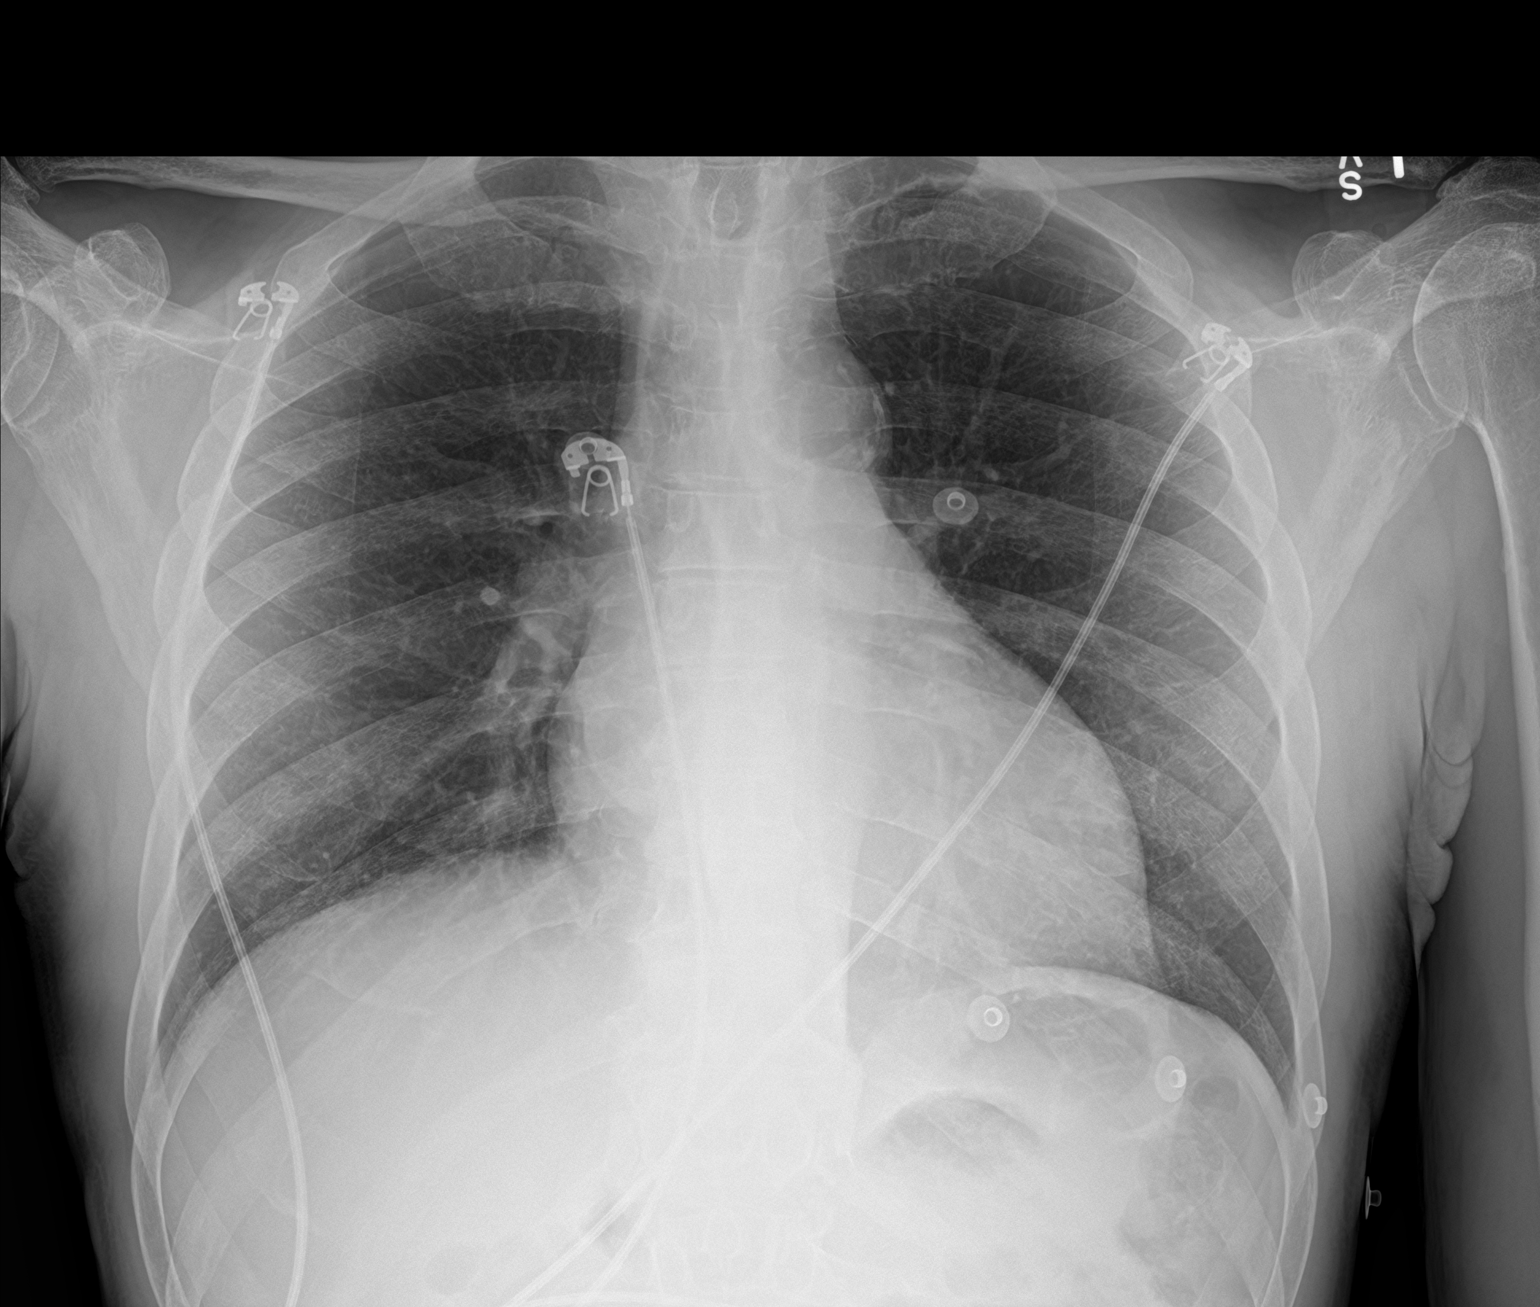

[2 of 2 positions shown; findings below may reference images not displayed]

FINDINGS: The heart size and mediastinal contours are within normal limits.
There is moderate severity calcification of the aortic arch. Both
lungs are clear. The visualized skeletal structures are
unremarkable.
IMPRESSION: No active cardiopulmonary disease.

## 2021-08-09 MED ORDER — SODIUM CHLORIDE 0.9 % IV BOLUS
1000.0000 mL | Freq: Once | INTRAVENOUS | Status: AC
  Administered 2021-08-09: 1000 mL via INTRAVENOUS

## 2021-08-09 MED ORDER — ACETAMINOPHEN 650 MG RE SUPP: 650.0000 mg | Freq: Four times a day (QID) | RECTAL | Status: DC | PRN

## 2021-08-09 MED ORDER — SODIUM CHLORIDE 0.9 % IV SOLN: INTRAVENOUS | Status: DC

## 2021-08-09 MED ORDER — SODIUM CHLORIDE 0.9 % IV SOLN: INTRAVENOUS | Status: AC

## 2021-08-09 MED ORDER — SODIUM CHLORIDE 0.9% FLUSH
3.0000 mL | Freq: Two times a day (BID) | INTRAVENOUS | Status: DC
  Administered 2021-08-10 – 2021-08-11 (×3): 3 mL via INTRAVENOUS

## 2021-08-09 MED ORDER — ACETAMINOPHEN 325 MG PO TABS
650.0000 mg | ORAL_TABLET | Freq: Four times a day (QID) | ORAL | Status: DC | PRN
  Administered 2021-08-10 (×2): 650 mg via ORAL
  Filled 2021-08-09 (×2): qty 2

## 2021-08-09 MED ORDER — HEPARIN SODIUM (PORCINE) 5000 UNIT/ML IJ SOLN
5000.0000 [IU] | Freq: Two times a day (BID) | INTRAMUSCULAR | Status: DC
  Administered 2021-08-09 – 2021-08-11 (×3): 5000 [IU] via SUBCUTANEOUS
  Filled 2021-08-09 (×4): qty 1

## 2021-08-09 NOTE — ED Notes (Signed)
Received report from Efrain RN. ?

## 2021-08-09 NOTE — ED Provider Notes (Signed)
? ?[facility] ?Provider Note ? ? ? None  ?  (approximate) ? ? ?History  ? ?Loss of Consciousness (Pt sitting outside in chair for unknown amount of time, syncopal episode per family, diaphoretic at scene, positive for orthostatic vitals 90/58 sitting and 79/46 standing, pt A&O) ? ? ?HPI ? ?Nicholas Weiss is a 71 y.o. male   with past medical history of diabetes, hypertension, hyperlipidemia, here with syncopal episode.  The patient states that he was outside today.  He states that he had been standing, but got very acutely lightheaded, dizzy, and felt like he is going to pass out.  He then "slumped down" and fell to the ground.  Reports he feels generally weak.  He was recently admitted at [facility] for what was diagnosed as possible viral gastroenteritis with dehydration was given IV fluids and monitored for this.  He states he has been trying to drink since then but continues to feel generally weak.  Denies any medication changes.  No chest pain.  No lightheadedness.  No focal numbness or weakness.  He now feels back to his baseline. ? ?  ? ? ?Physical Exam  ? ?Triage Vital Signs: ?ED Triage Vitals  ?Enc Vitals Group  ?   BP --   ?   Pulse --   ?   Resp --   ?   Temp --   ?   Temp src --   ?   SpO2 --   ?   Weight 08/09/21 1752 140 lb (63.5 kg)  ?   Height 08/09/21 1752 '5\' 10"'$  (1.778 m)  ?   Head Circumference --   ?   Peak Flow --   ?   Pain Score 08/09/21 1751 0  ?   Pain Loc --   ?   Pain Edu? --   ?   Excl. in [place]? --   ? ? ?Most recent vital signs: ?Vitals:  ? 08/09/21 1905 08/09/21 1931  ?BP: (!) 168/94 (!) 152/82  ?Pulse: 71 73  ?Resp: 18 18  ?Temp:  97.6 ?F (36.4 ?C)  ?SpO2: 99% 100%  ? ? ? ?General: Awake, no distress.  ?CV:  Good peripheral perfusion.  No murmurs. ?Resp:  Normal effort.  Lungs clear to auscultation bilaterally. ?Abd:  No distention.  No tenderness. ?Other:  Dry mucous membranes.  No apparent trauma to the head or neck. ? ? ?ED Results / Procedures / Treatments   ? ?Labs ?(all labs ordered are listed, but only abnormal results are displayed) ?Labs Reviewed  ?CBC WITH DIFFERENTIAL/PLATELET - Abnormal; Notable for the following components:  ?    Result Value  ? WBC 2.5 (*)   ? RBC 2.82 (*)   ? Hemoglobin 8.9 (*)   ? HCT 27.4 (*)   ? Neutro Abs 1.2 (*)   ? All other components within normal limits  ?COMPREHENSIVE METABOLIC PANEL - Abnormal; Notable for the following components:  ? BUN 39 (*)   ? Creatinine, Ser 2.46 (*)   ? Calcium 8.6 (*)   ? Total Protein 8.6 (*)   ? Albumin 3.0 (*)   ? GFR, Estimated 28 (*)   ? All other components within normal limits  ?TROPONIN I (HIGH SENSITIVITY) - Abnormal; Notable for the following components:  ? Troponin I (High Sensitivity) 23 (*)   ? All other components within normal limits  ?RESP PANEL BY RT-PCR (FLU A&B, COVID) ARPGX2  ?LIPASE, BLOOD  ?LACTIC ACID, PLASMA  ?  LACTIC ACID, PLASMA  ?MAGNESIUM  ?TROPONIN I (HIGH SENSITIVITY)  ? ? ? ?EKG ?Sinus rhythm with pronounced first-degree AV block, ventricular rate 68.  PR 392, QRS 90, QTc 467.  No acute ST elevations ? ? ?RADIOLOGY ? ? ? ?I also independently reviewed and agree with radiologist interpretations. ? ? ?PROCEDURES: ? ?Critical Care performed: No ? ?.1-3 Lead EKG Interpretation ?Performed by: Duffy Bruce, MD ?Authorized by: Duffy Bruce, MD  ? ?  Interpretation: normal   ?  ECG rate:  60-80 ?  ECG rate assessment: normal   ?  Rhythm: sinus rhythm   ?  Ectopy: PVCs   ?  Conduction: normal   ?Comments:  ?   Indication: Syncope ? ? ? ?MEDICATIONS ORDERED IN ED: ?Medications  ?0.9 %  sodium chloride infusion ( Intravenous New Bag/Given 08/09/21 2023)  ?sodium chloride 0.9 % bolus 1,000 mL (1,000 mLs Intravenous New Bag/Given 08/09/21 1817)  ? ? ? ?IMPRESSION / MDM / ASSESSMENT AND PLAN / ED COURSE  ?I reviewed the triage vital signs and the nursing notes. ?             ?               ? ? ?The patient is on the cardiac monitor to evaluate for evidence of arrhythmia and/or  significant heart rate changes. ? ? ?Ddx:  ?Differential includes the following, with pertinent life- or limb-threatening emergencies considered: ? ?Arrhythmia, orthostasis, CVA, seizure, ACS, PE, symptomatic anemia ? ? ?MDM:  ?71 year-old male with history of diabetes, hypertension, hyperlipidemia, here with generalized weakness and syncope.  Regarding his syncopal episode, clinically I suspect orthostasis in the setting of dehydration related to recent viral GI illness.  Abdomen is soft at this time.  However, EKG is remarkable for severe first-degree AV block with near P on T phenomenon.  Lab work shows significant acute on chronic kidney injury with creatinine of 2.5, above just 1.6 when he was in the hospital [facility] per my review of their records.  Patient also appears generally weak and orthostatic despite fluids.  Will admit for observation and telemetry.  CBC shows baseline anemia.  No significant leukocytosis.  No evidence of ischemia on EKG and he has no chest pain.  Admit to the hospitalist service. ? ? ?MEDICATIONS GIVEN IN ED: ?Medications  ?0.9 %  sodium chloride infusion ( Intravenous New Bag/Given 08/09/21 2023)  ?sodium chloride 0.9 % bolus 1,000 mL (1,000 mLs Intravenous New Bag/Given 08/09/21 1817)  ? ? ? ?Consults:  ?Hospitalist consulted for admission ? ? ?EMR reviewed  ?Reviewed admission notes from [facility] including recent lab work showing baseline creatinine 1.4-1.6 ? ? ? ? ?FINAL CLINICAL IMPRESSION(S) / ED DIAGNOSES  ? ?Final diagnoses:  ?Syncope and collapse  ?Dehydration  ?Acute renal failure superimposed on chronic kidney disease, unspecified CKD stage, unspecified acute renal failure type ([place])  ?First degree AV block  ? ? ? ?Rx / DC Orders  ? ?ED Discharge Orders   ? ? None  ? ?  ? ? ? ?Note:  This document was prepared using Dragon voice recognition software and may include unintentional dictation errors. ?  ?Duffy Bruce, MD ?08/09/21 2045 ? ?

## 2021-08-09 NOTE — H&P (Signed)
?History and Physical  ? ? ?Patient: Nicholas Weiss GGE:[id number] DOB: September 01, 1950 ?DOA: 08/09/2021 ?DOS: the patient was seen and examined on 08/10/2021 ?PCP: Dion Body, MD  ?Patient coming from: Home ? ?Chief Complaint:  ?Chief Complaint  ?Patient presents with  ? Loss of Consciousness  ?  Pt sitting outside in chair for unknown amount of time, syncopal episode per family, diaphoretic at scene, positive for orthostatic vitals 90/58 sitting and 79/46 standing, pt A&O  ? ?HPI: Nicholas Weiss is a 71 y.o. male with medical history significant of hypertension, diabetes mellitus type 2, dyslipidemia, THC abuse, presenting with syncopal episode patient states that he was at home standing next to his truck he realized that he was sliding down and that he was out and lost consciousness while he was falling notes he did not hit his head. ?Patient otherwise does not report headaches blurred vision chest pain confusion incontinence tongue biting chills or feeling ill prior to this episode at home.  EKG shows a severe PR prolonged interview of 397 his previous EKG PR interval Was 172. ?Review of Systems  ?Constitutional:  Positive for malaise/fatigue.  ?Neurological:  Positive for dizziness, loss of consciousness and weakness.  ?All other systems reviewed and are negative. ? ?Past Medical History:  ?Diagnosis Date  ? DKA (diabetic ketoacidoses) 04/06/2016  ? Hypercholesteremia   ? Hypertension   ? ?Past Surgical History:  ?Procedure Laterality Date  ? COLONOSCOPY WITH PROPOFOL N/A 02/01/2020  ? Procedure: COLONOSCOPY WITH PROPOFOL;  Surgeon: Lin Landsman, MD;  Location: [facility] ENDOSCOPY;  Service: Gastroenterology;  Laterality: N/A;  ? ESOPHAGOGASTRODUODENOSCOPY  02/01/2020  ? Procedure: ESOPHAGOGASTRODUODENOSCOPY (EGD);  Surgeon: Lin Landsman, MD;  Location: [facility] ENDOSCOPY;  Service: Gastroenterology;;  ? KNEE SURGERY Right   ? Torn meniscus  ? KNEE SURGERY Left   ? ?Social History:  reports that he has  never smoked. He has never used smokeless tobacco. He reports current alcohol use. He reports that he does not currently use drugs after having used the following drugs: Marijuana. ? ?Allergies  ?Allergen Reactions  ? Penicillins Other (See Comments)  ?  Has patient had a PCN reaction causing immediate rash, facial/tongue/throat swelling, SOB or lightheadedness with hypotension: No ?Has patient had a PCN reaction causing severe rash involving mucus membranes or skin necrosis: No ?Has patient had a PCN reaction that required hospitalization No ?Has patient had a PCN reaction occurring within the last 10 years: No ?If all of the above answers are "NO", then may proceed with Cephalosporin use. ?  ? ? ?Family History  ?Problem Relation Age of Onset  ? Heart attack Father   ? Hypertension Sister   ? Cancer Sister   ? ? ?Prior to Admission medications   ?Medication Sig Start Date End Date Taking? Authorizing Provider  ?Alcohol Swabs (B-D SINGLE USE SWABS REGULAR) PADS Use to check blood sugar four times daily for type 1 diabetes 07/11/20   Birdie Sons, MD  ?amLODipine (NORVASC) 10 MG tablet Take 1 tablet (10 mg total) by mouth daily. 12/04/20   Aline August, MD  ?aspirin 81 MG chewable tablet Chew 1 tablet by mouth daily. ?Patient not taking: Reported on 12/17/2020    [provider]  ?Blood Glucose Calibration (TRUE METRIX LEVEL 1) Low SOLN Use to check blood sugar four times daily for type 1 diabetes 07/11/20   Birdie Sons, MD  ?FARXIGA 10 MG TABS tablet Take 10 mg by mouth daily. 05/05/21   [provider]  ?gabapentin (NEURONTIN) 300 MG capsule Take 300 mg by mouth 2 (two) times daily. 04/04/21   [provider]  ?insulin aspart (NOVOLOG) 100 UNIT/ML injection Inject up to 14 units three times daily before meals according to sliding scale 06/25/20   Birdie Sons, MD  ?insulin degludec (TRESIBA FLEXTOUCH) 100 UNIT/ML FlexTouch Pen 15 units daily. ?Patient taking differently: Inject 15  Units into the skin daily. 15 units daily. 12/04/20   Aline August, MD  ?Insulin Syringe-Needle U-100 (INSULIN SYRINGE 1CC/31GX5/16") 31G X 5/16" 1 ML MISC For insulin injections up to 4 times daily 07/11/20   Birdie Sons, MD  ?lisinopril (ZESTRIL) 2.5 MG tablet Take 2.5 mg by mouth daily. 11/08/20 11/08/21  [provider]  ?lovastatin (MEVACOR) 40 MG tablet Take 40 mg by mouth at bedtime. 04/10/19   [provider]  ?ondansetron (ZOFRAN) 4 MG tablet Take 1 tablet (4 mg total) by mouth every 8 (eight) hours as needed for nausea or vomiting. 05/07/21   Merlyn Lot, MD  ?pregabalin [facility]) 75 MG capsule Take 1 capsule by mouth twice daily 11/08/20   Birdie Sons, MD  ? ? ?Physical Exam: ?Vitals:  ? 08/09/21 1752 08/09/21 1905 08/09/21 1931  ?BP:  (!) 168/94 (!) 152/82  ?Pulse:  71 73  ?Resp:  18 18  ?Temp:   97.6 ?F (36.4 ?C)  ?SpO2:  99% 100%  ?Weight: 63.5 kg    ?Height: '5\' 10"'$  (1.778 m)    ? ?Physical Exam ?Vitals and nursing note reviewed.  ?Constitutional:   ?   General: He is not in acute distress. ?   Appearance: Normal appearance. He is not ill-appearing, toxic-appearing or diaphoretic.  ?HENT:  ?   Head: Normocephalic and atraumatic.  ?   Right Ear: Hearing and external ear normal.  ?   Left Ear: Hearing and external ear normal.  ?   Nose: Nose normal. No nasal deformity.  ?   Mouth/Throat:  ?   Lips: Pink.  ?   Mouth: Mucous membranes are moist.  ?   Tongue: No lesions.  ?Eyes:  ?   Extraocular Movements: Extraocular movements intact.  ?   Pupils: Pupils are equal, round, and reactive to light.  ?Neck:  ?   Vascular: No carotid bruit.  ?Cardiovascular:  ?   Rate and Rhythm: Normal rate and regular rhythm.  ?   Pulses: Normal pulses.  ?   Heart sounds: Normal heart sounds.  ?Pulmonary:  ?   Effort: Pulmonary effort is normal.  ?   Breath sounds: Normal breath sounds.  ?Abdominal:  ?   General: Bowel sounds are normal. There is no distension.  ?   Palpations: Abdomen is soft. There  is no mass.  ?   Tenderness: There is no abdominal tenderness. There is no guarding.  ?   Hernia: No hernia is present.  ?Musculoskeletal:  ?   Right lower leg: No edema.  ?   Left lower leg: No edema.  ?Skin: ?   General: Skin is warm.  ?Neurological:  ?   General: No focal deficit present.  ?   Mental Status: He is alert and oriented to person, place, and time.  ?   Cranial Nerves: Cranial nerves 2-12 are intact.  ?   Motor: Motor function is intact.  ?Psychiatric:     ?   Attention and Perception: Attention normal.     ?   Mood and Affect: Mood normal.     ?  Speech: Speech normal.     ?   Behavior: Behavior normal. Behavior is cooperative.     ?   Cognition and Memory: Cognition normal.  ? ? ?Data Reviewed: ?Results for orders placed or performed during the hospital encounter of 08/09/21 (from the past 24 hour(s))  ?CBC with Differential     Status: Abnormal  ? Collection Time: 08/09/21  5:54 PM  ?Result Value Ref Range  ? WBC 2.5 (L) 4.0 - 10.5 K/uL  ? RBC 2.82 (L) 4.22 - 5.81 MIL/uL  ? Hemoglobin 8.9 (L) 13.0 - 17.0 g/dL  ? HCT 27.4 (L) 39.0 - 52.0 %  ? MCV 97.2 80.0 - 100.0 fL  ? MCH 31.6 26.0 - 34.0 pg  ? MCHC 32.5 30.0 - 36.0 g/dL  ? RDW 13.3 11.5 - 15.5 %  ? Platelets 170 150 - 400 K/uL  ? nRBC 0.0 0.0 - 0.2 %  ? Neutrophils Relative % 48 %  ? Neutro Abs 1.2 (L) 1.7 - 7.7 K/uL  ? Lymphocytes Relative 39 %  ? Lymphs Abs 1.0 0.7 - 4.0 K/uL  ? Monocytes Relative 12 %  ? Monocytes Absolute 0.3 0.1 - 1.0 K/uL  ? Eosinophils Relative 1 %  ? Eosinophils Absolute 0.0 0.0 - 0.5 K/uL  ? Basophils Relative 0 %  ? Basophils Absolute 0.0 0.0 - 0.1 K/uL  ? Immature Granulocytes 0 %  ? Abs Immature Granulocytes 0.00 0.00 - 0.07 K/uL  ?Comprehensive metabolic panel     Status: Abnormal  ? Collection Time: 08/09/21  5:54 PM  ?Result Value Ref Range  ? Sodium 137 135 - 145 mmol/L  ? Potassium 4.1 3.5 - 5.1 mmol/L  ? Chloride 104 98 - 111 mmol/L  ? CO2 26 22 - 32 mmol/L  ? Glucose, Bld 78 70 - 99 mg/dL  ? BUN 39 (H) 8 - 23  mg/dL  ? Creatinine, Ser 2.46 (H) 0.61 - 1.24 mg/dL  ? Calcium 8.6 (L) 8.9 - 10.3 mg/dL  ? Total Protein 8.6 (H) 6.5 - 8.1 g/dL  ? Albumin 3.0 (L) 3.5 - 5.0 g/dL  ? AST 15 15 - 41 U/L  ? ALT 13 0 - 44 U/L

## 2021-08-10 ENCOUNTER — Observation Stay: Payer: Medicare (Managed Care)

## 2021-08-10 ENCOUNTER — Other Ambulatory Visit: Payer: Self-pay

## 2021-08-10 ENCOUNTER — Observation Stay (HOSPITAL_BASED_OUTPATIENT_CLINIC_OR_DEPARTMENT_OTHER)
Admit: 2021-08-10 | Discharge: 2021-08-10 | Disposition: A | Discharge status: Home / Self Care | Payer: Medicare (Managed Care) | Attending: Internal Medicine | Admitting: Internal Medicine

## 2021-08-10 ENCOUNTER — Encounter: Payer: Self-pay | Admitting: Internal Medicine

## 2021-08-10 DIAGNOSIS — I517 Cardiomegaly: Secondary | ICD-10-CM | POA: Diagnosis not present

## 2021-08-10 DIAGNOSIS — R55 Syncope and collapse: Secondary | ICD-10-CM | POA: Diagnosis not present

## 2021-08-10 DIAGNOSIS — I10 Essential (primary) hypertension: Secondary | ICD-10-CM | POA: Diagnosis not present

## 2021-08-10 LAB — URINE DRUG SCREEN, QUALITATIVE (ARMC ONLY)
Amphetamines, Ur Screen: NOT DETECTED
Barbiturates, Ur Screen: NOT DETECTED
Benzodiazepine, Ur Scrn: NOT DETECTED
Cannabinoid 50 Ng, Ur ~~LOC~~: POSITIVE — AB
Cocaine Metabolite,Ur ~~LOC~~: POSITIVE — AB
MDMA (Ecstasy)Ur Screen: NOT DETECTED
Methadone Scn, Ur: NOT DETECTED
Opiate, Ur Screen: NOT DETECTED
Phencyclidine (PCP) Ur S: NOT DETECTED
Tricyclic, Ur Screen: NOT DETECTED

## 2021-08-10 LAB — COMPREHENSIVE METABOLIC PANEL
ALT: 11 U/L (ref 0–44)
AST: 16 U/L (ref 15–41)
Albumin: 2.6 g/dL — ABNORMAL LOW (ref 3.5–5.0)
Alkaline Phosphatase: 41 U/L (ref 38–126)
Anion gap: 2 — ABNORMAL LOW (ref 5–15)
BUN: 31 mg/dL — ABNORMAL HIGH (ref 8–23)
CO2: 24 mmol/L (ref 22–32)
Calcium: 7.7 mg/dL — ABNORMAL LOW (ref 8.9–10.3)
Chloride: 108 mmol/L (ref 98–111)
Creatinine, Ser: 1.67 mg/dL — ABNORMAL HIGH (ref 0.61–1.24)
GFR, Estimated: 44 mL/min — ABNORMAL LOW (ref >60)
Glucose, Bld: 212 mg/dL — ABNORMAL HIGH (ref 70–99)
Potassium: 4.7 mmol/L (ref 3.5–5.1)
Sodium: 134 mmol/L — ABNORMAL LOW (ref 135–145)
Total Bilirubin: 1 mg/dL (ref 0.3–1.2)
Total Protein: 8 g/dL (ref 6.5–8.1)

## 2021-08-10 LAB — URINALYSIS, COMPLETE (UACMP) WITH MICROSCOPIC
Bacteria, UA: NONE SEEN
Bilirubin Urine: NEGATIVE
Glucose, UA: >=500 mg/dL — AB
Ketones, ur: NEGATIVE mg/dL
Leukocytes,Ua: NEGATIVE
Nitrite: NEGATIVE
Protein, ur: 30 mg/dL — AB
Specific Gravity, Urine: 1.021 (ref 1.005–1.030)
Squamous Epithelial / HPF: NONE SEEN (ref 0–5)
pH: 5 (ref 5.0–8.0)

## 2021-08-10 LAB — CBC
HCT: 26.6 % — ABNORMAL LOW (ref 39.0–52.0)
Hemoglobin: 8.6 g/dL — ABNORMAL LOW (ref 13.0–17.0)
MCH: 30.7 pg (ref 26.0–34.0)
MCHC: 32.3 g/dL (ref 30.0–36.0)
MCV: 95 fL (ref 80.0–100.0)
Platelets: 168 10*3/uL (ref 150–400)
RBC: 2.8 MIL/uL — ABNORMAL LOW (ref 4.22–5.81)
RDW: 13.4 % (ref 11.5–15.5)
WBC: 2.9 10*3/uL — ABNORMAL LOW (ref 4.0–10.5)
nRBC: 0 % (ref 0.0–0.2)

## 2021-08-10 LAB — CBG MONITORING, ED
Glucose-Capillary: 239 mg/dL — ABNORMAL HIGH (ref 70–99)
Glucose-Capillary: 214 mg/dL — ABNORMAL HIGH (ref 70–99)
Glucose-Capillary: 179 mg/dL — ABNORMAL HIGH (ref 70–99)

## 2021-08-10 LAB — GLUCOSE, CAPILLARY
Glucose-Capillary: 82 mg/dL (ref 70–99)
Glucose-Capillary: 96 mg/dL (ref 70–99)

## 2021-08-10 LAB — VITAMIN D 25 HYDROXY (VIT D DEFICIENCY, FRACTURES): Vit D, 25-Hydroxy: 32.09 ng/mL (ref 30–100)

## 2021-08-10 LAB — SEDIMENTATION RATE: Sed Rate: >140 mm/hr — ABNORMAL HIGH (ref 0–20)

## 2021-08-10 LAB — LIPID PANEL
Cholesterol: 128 mg/dL (ref 0–200)
HDL: 38 mg/dL — ABNORMAL LOW (ref >40)
LDL Cholesterol: 60 mg/dL (ref 0–99)
Total CHOL/HDL Ratio: 3.4 RATIO
Triglycerides: 152 mg/dL — ABNORMAL HIGH (ref <150)
VLDL: 30 mg/dL (ref 0–40)

## 2021-08-10 LAB — PHOSPHORUS: Phosphorus: 3.2 mg/dL (ref 2.5–4.6)

## 2021-08-10 LAB — HIV ANTIBODY (ROUTINE TESTING W REFLEX): HIV Screen 4th Generation wRfx: NONREACTIVE

## 2021-08-10 LAB — ECHOCARDIOGRAM COMPLETE: S' Lateral: 2.7 cm

## 2021-08-10 LAB — C-REACTIVE PROTEIN: CRP: 0.6 mg/dL (ref <1.0)

## 2021-08-10 LAB — TROPONIN I (HIGH SENSITIVITY): Troponin I (High Sensitivity): 23 ng/L — ABNORMAL HIGH (ref <18)

## 2021-08-10 LAB — RESP PANEL BY RT-PCR (FLU A&B, COVID) ARPGX2
Influenza A by PCR: NEGATIVE
Influenza B by PCR: NEGATIVE
SARS Coronavirus 2 by RT PCR: POSITIVE — AB

## 2021-08-10 LAB — HEMOGLOBIN A1C
Hgb A1c MFr Bld: 10.2 % — ABNORMAL HIGH (ref 4.8–5.6)
Mean Plasma Glucose: 246.04 mg/dL

## 2021-08-10 IMAGING — CT CT ABD-PELV W/O CM
2 of 4 series · 16 of 46 positions shown, 18 images · non-contrast
Comparison: [DATE]

CLINICAL DATA: Recent syncopal episode with orthostatic hypotension



[Series 2: routine abd/pel wo · axial · 0.75mm/px · z∈[-972,-547]mm · 13 of 95 slices shown, 15 images]
[im 5/95  soft-tissue]
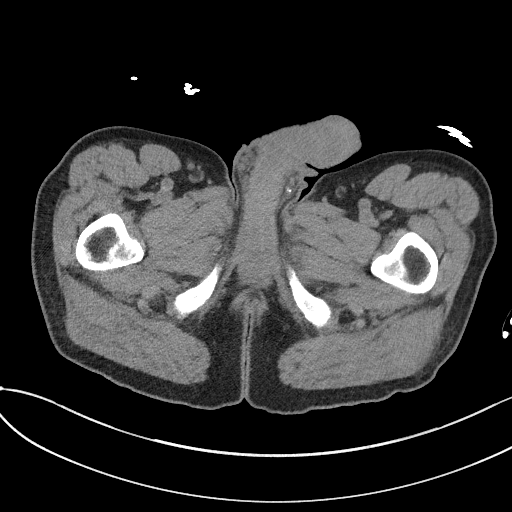
[im 5/95  bone]
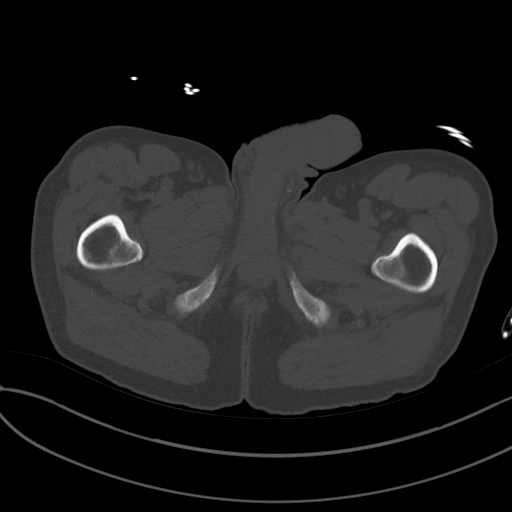
[im 13/95  soft-tissue]
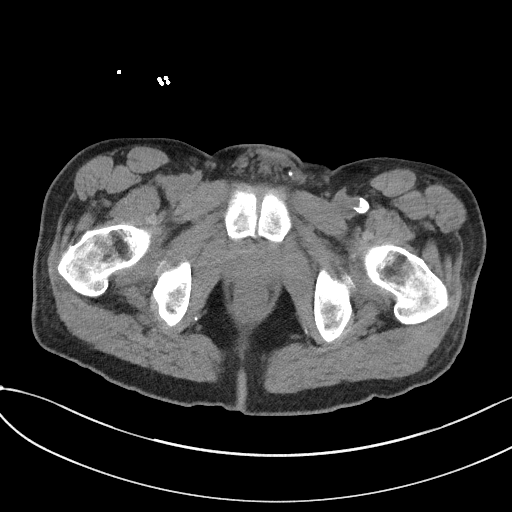
[im 21/95  soft-tissue]
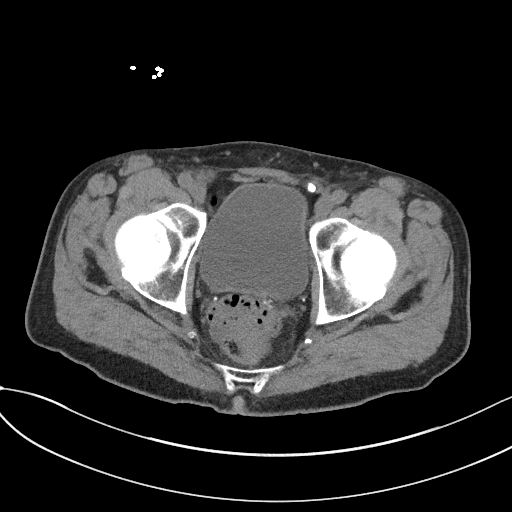
[im 25/95  soft-tissue]
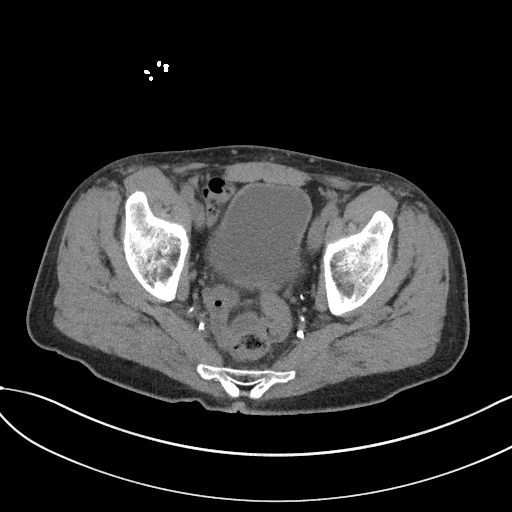
[im 33/95  soft-tissue]
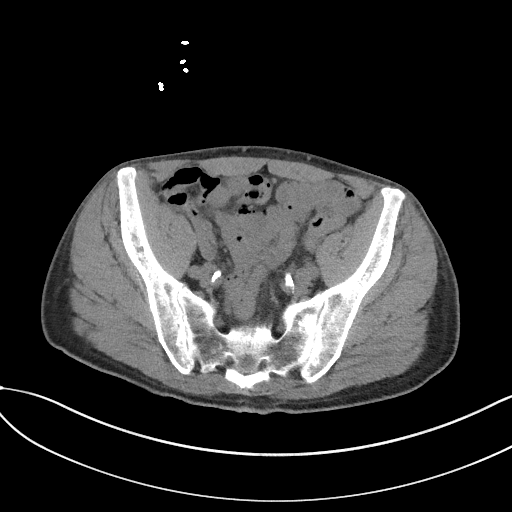
[im 41/95  soft-tissue]
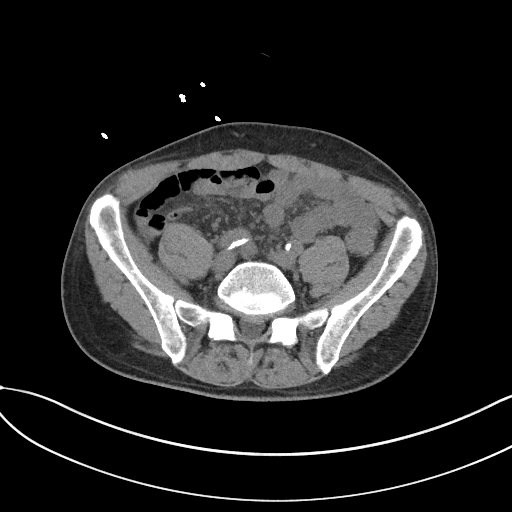
[im 50/95  soft-tissue]
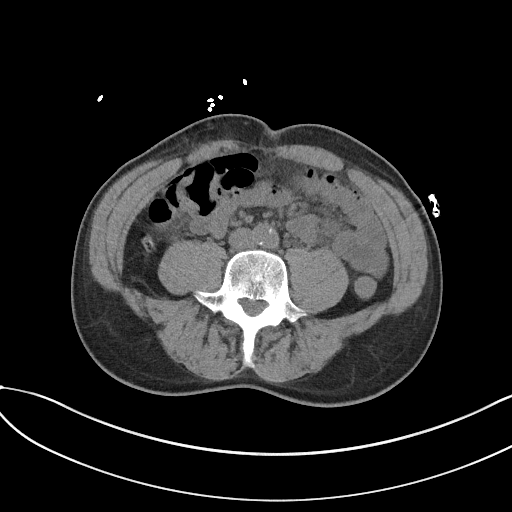
[im 54/95  soft-tissue]
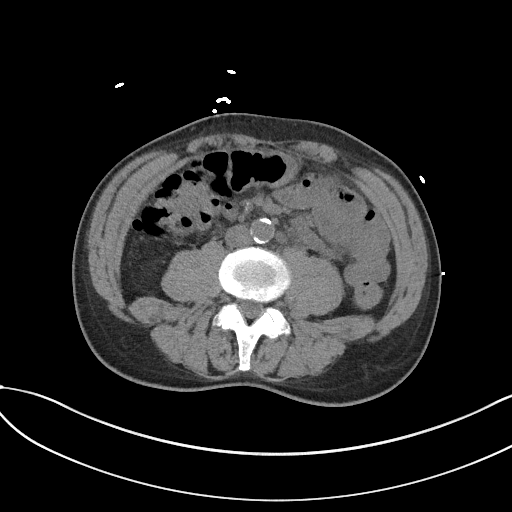
[im 62/95  soft-tissue]
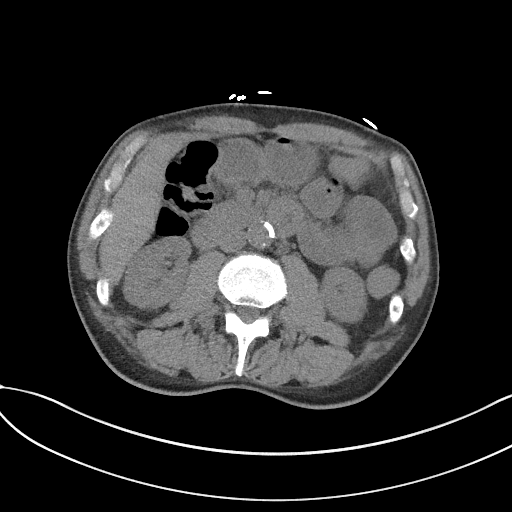
[im 62/95  bone]
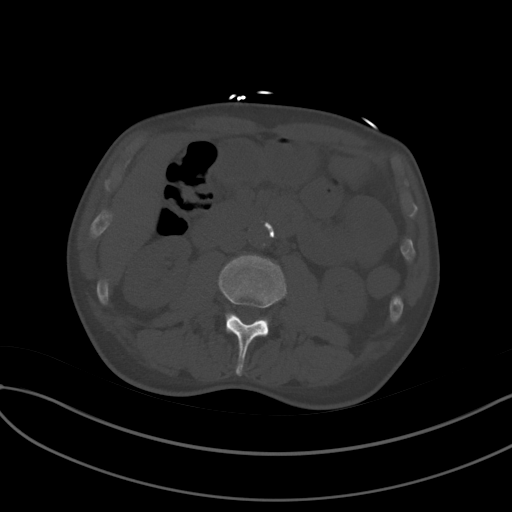
[im 70/95  soft-tissue]
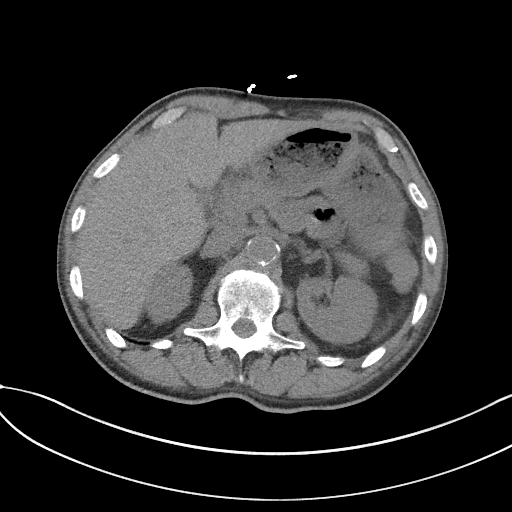
[im 74/95  soft-tissue]
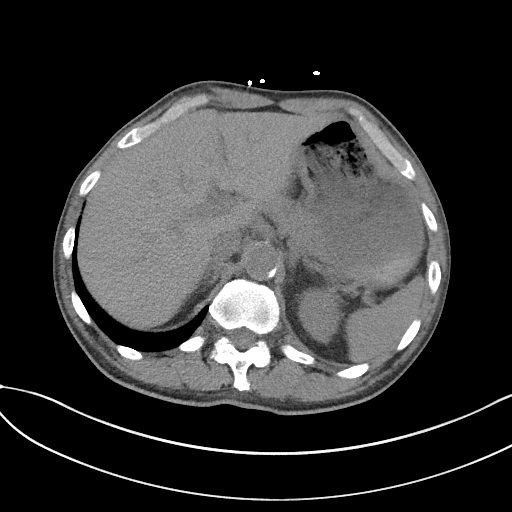
[im 82/95  soft-tissue]
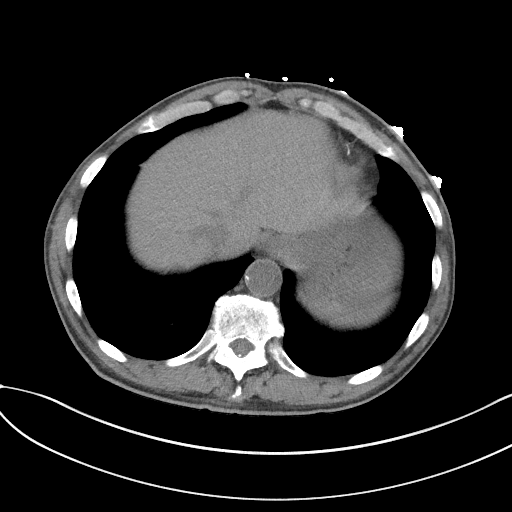
[im 90/95  soft-tissue]
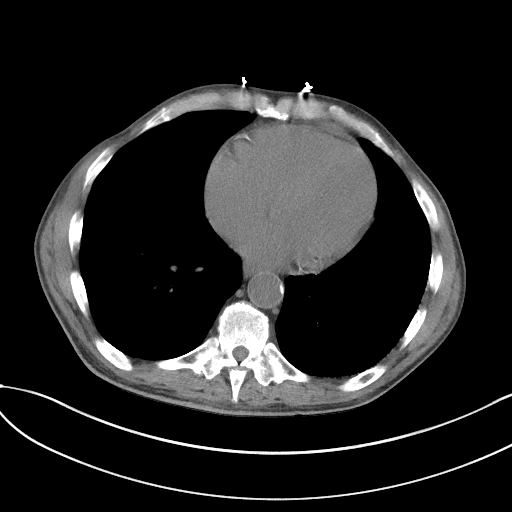

[Series 5: coronal st · coronal · 0.70mm/px · 3 of 83 slices shown]
[im 28/83  soft-tissue]
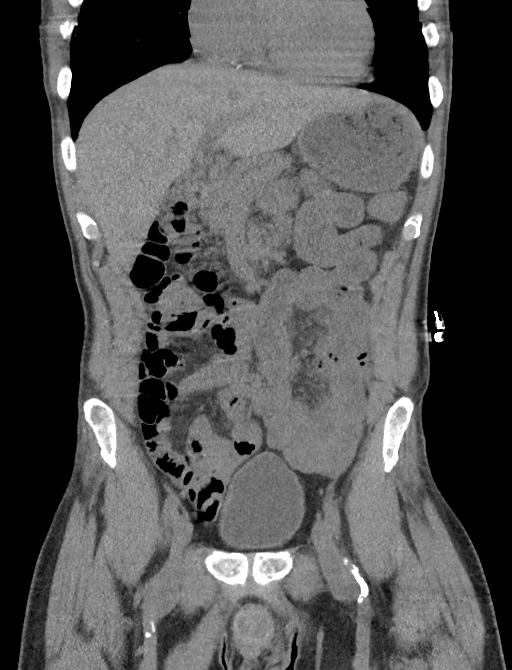
[im 37/83  soft-tissue]
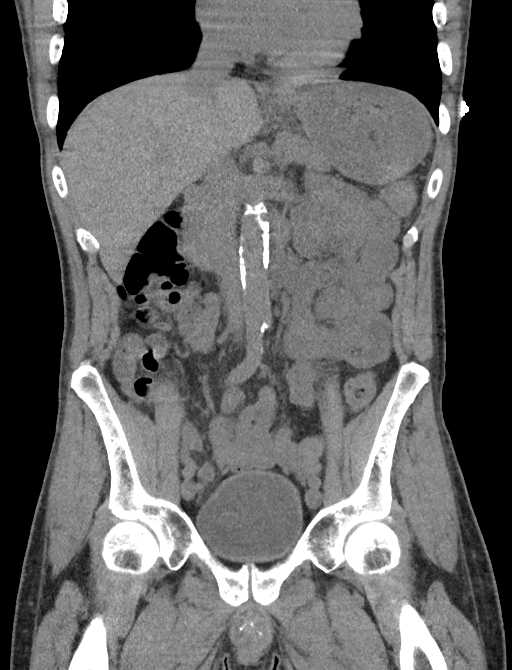
[im 46/83  soft-tissue]
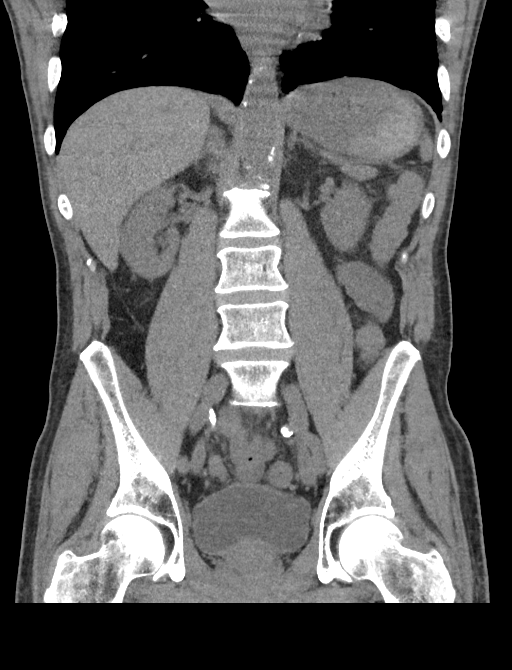

[16 of 46 positions shown; findings below may reference images not displayed]

FINDINGS: Lower chest: No acute abnormality.

Hepatobiliary: No focal liver abnormality is seen. No gallstones,
gallbladder wall thickening, or biliary dilatation.

Pancreas: Unremarkable. No pancreatic ductal dilatation or
surrounding inflammatory changes.

Spleen: Normal in size without focal abnormality.

Adrenals/Urinary Tract: Adrenal glands are within normal limits.
Kidneys are well visualized bilaterally. Tiny nonobstructing right
renal stone is noted in the upper pole. Ureters are within normal
limits. The bladder is well distended.

Stomach/Bowel: The appendix is within normal limits. No obstructive
or inflammatory changes of the colon are seen. Small bowel and
stomach are within normal limits.

Vascular/Lymphatic: Aortic atherosclerosis. No enlarged abdominal or
pelvic lymph nodes.

Reproductive: Prostate is unremarkable.

Other: No abdominal wall hernia or abnormality. No abdominopelvic
ascites.

Musculoskeletal: No acute or significant osseous findings.
IMPRESSION: Tiny nonobstructing right renal stone.

No other focal abnormality is noted.

## 2021-08-10 IMAGING — MR MR MRA HEAD W/O CM
1 series · 26 of 48 positions shown · IV contrast (gadavist)
Comparison: CT neck [DATE].

CLINICAL DATA: Stroke/TIA, determine embolic source; Carotid artery
stenosis screening, risk factors. Syncope.

EXAM:
MRA NECK WITHOUT AND WITH CONTRAST
MRA HEAD WITHOUT CONTRAST
TECHNIQUE: Multiplanar and multiecho pulse sequences of the neck were obtained
without and with intravenous contrast. Angiographic images of the
neck were obtained using MRA technique without and with intravenous
contrast; Angiographic images of the Circle of Willis were obtained
using MRA technique without intravenous contrast.
CONTRAST:  6mL GADAVIST GADOBUTROL 1 MMOL/ML IV SOLN

[Series 5: TOF · axial · 0.5mm · 0.48mm/px · z∈[-22,+74]mm · 26 of 217 slices shown]
[im 1/217]
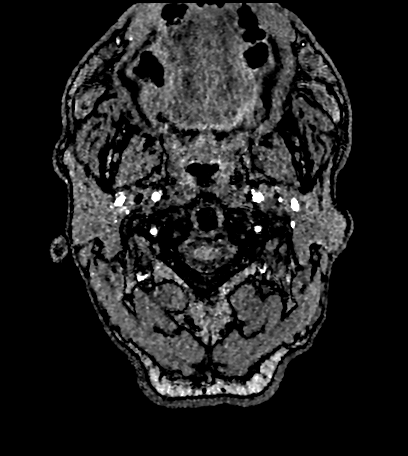
[im 5/217]
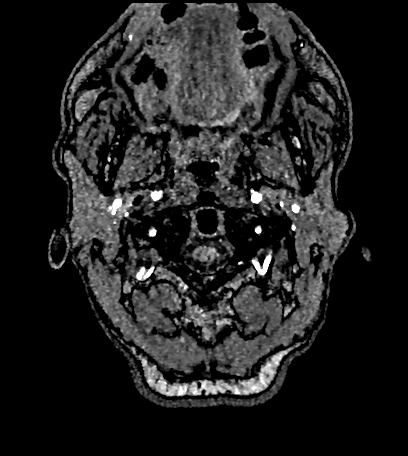
[im 10/217]
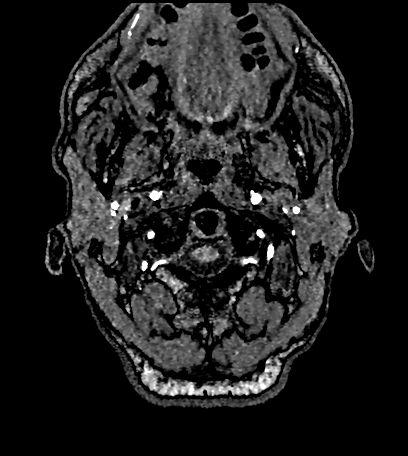
[im 14/217]
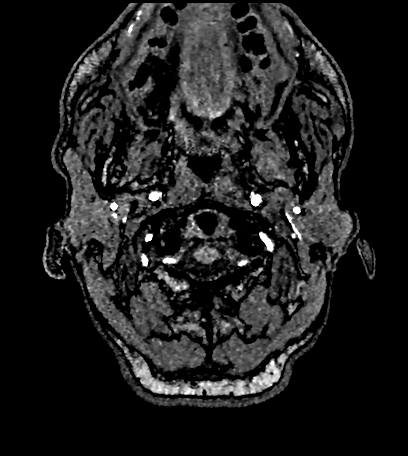
[im 19/217]
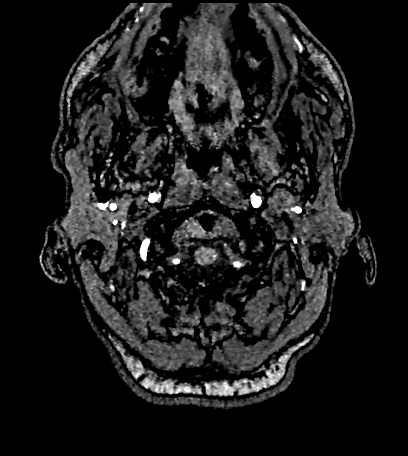
[im 23/217]
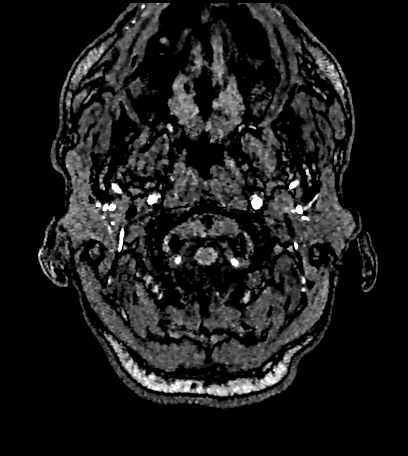
[im 28/217]
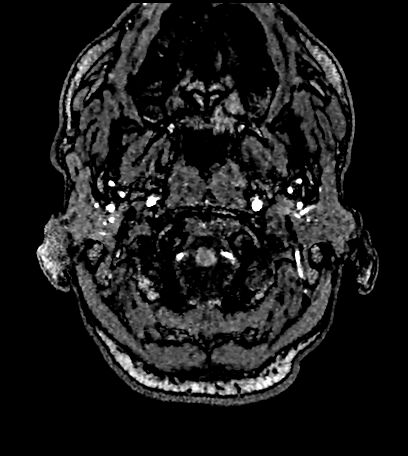
[im 33/217]
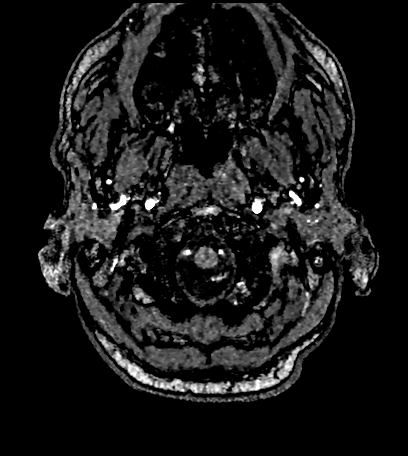
[im 37/217]
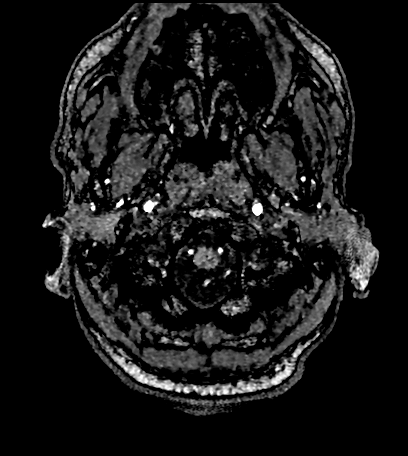
[im 42/217]
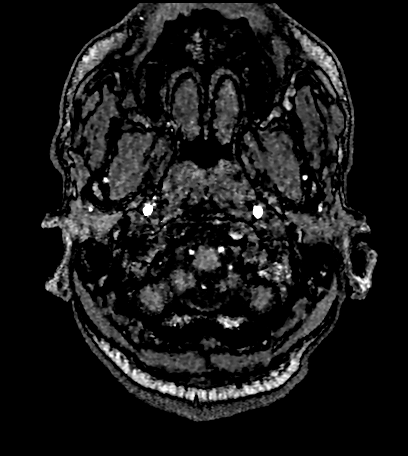
[im 46/217]
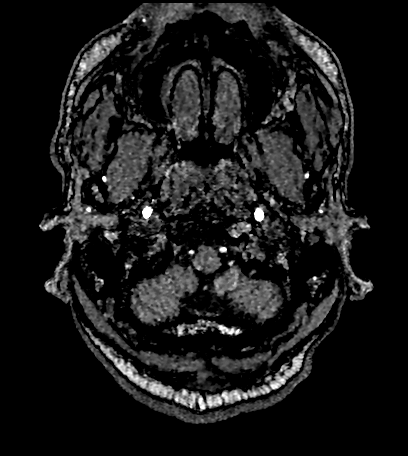
[im 51/217]
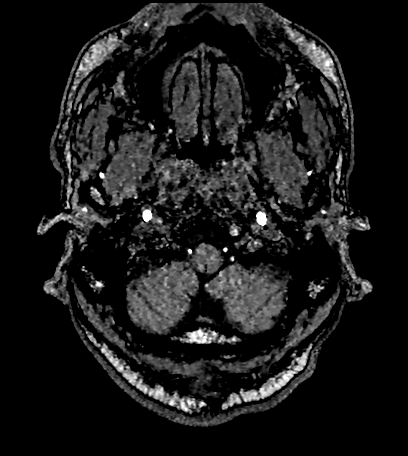
[im 56/217]
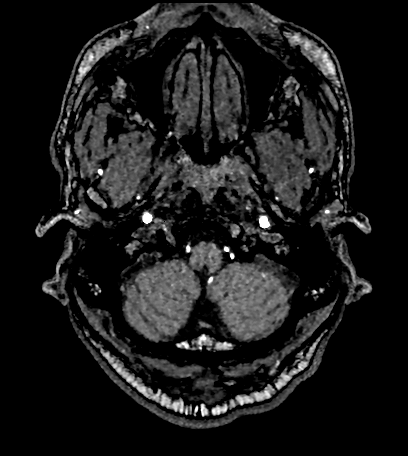
[im 60/217]
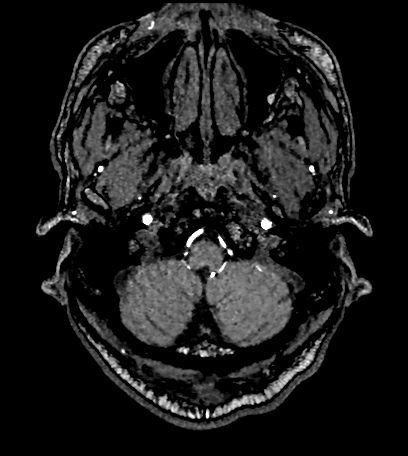
[im 65/217]
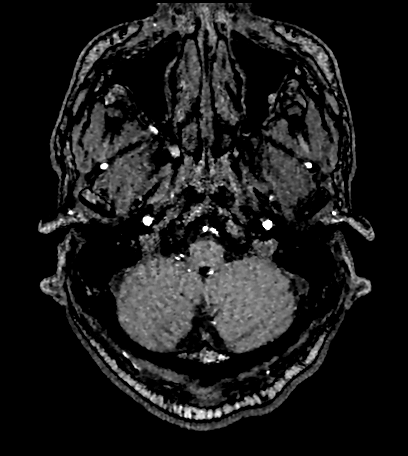
[im 69/217]
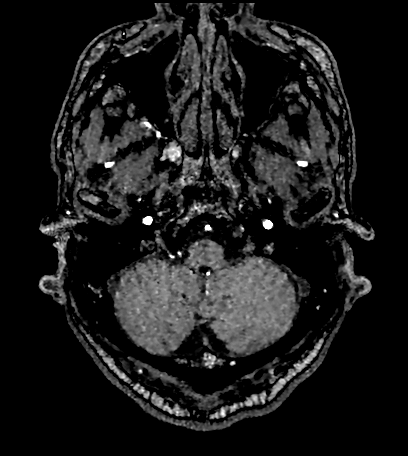
[im 74/217]
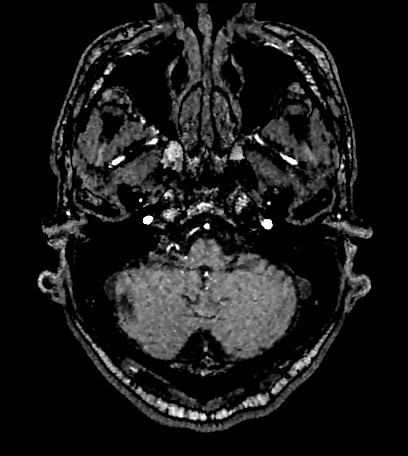
[im 79/217]
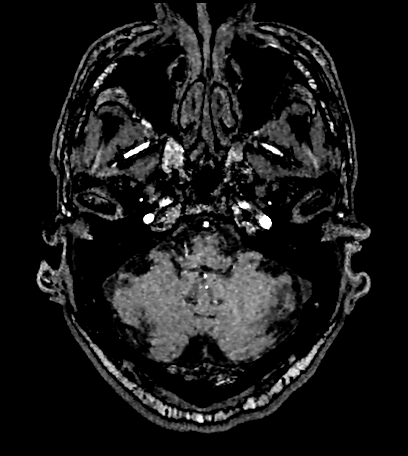
[im 83/217]
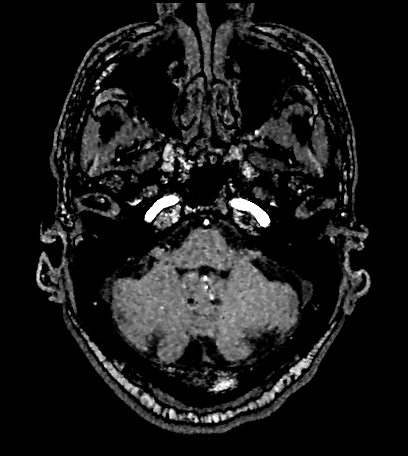
[im 97/217]
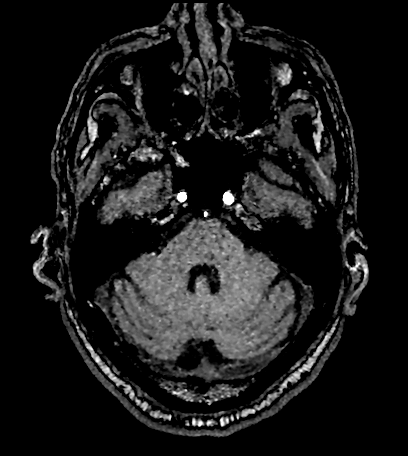
[im 111/217]
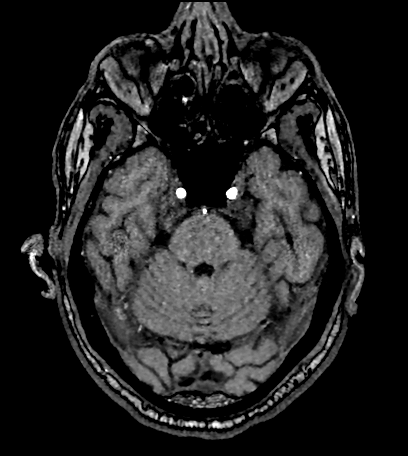
[im 125/217]
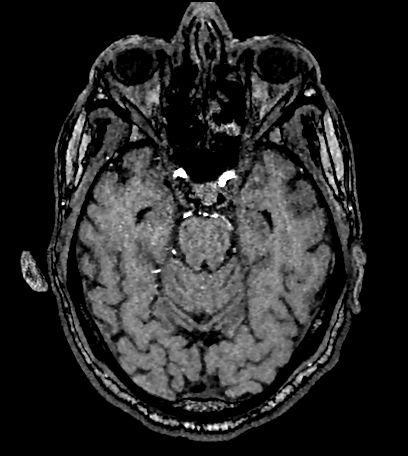
[im 152/217]
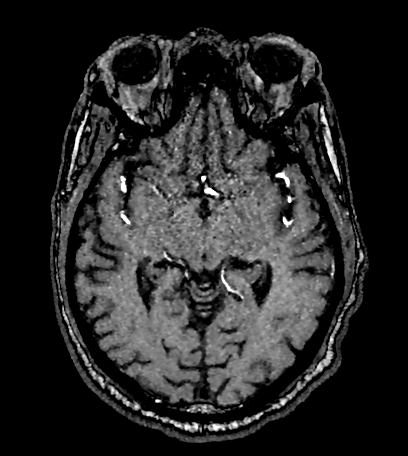
[im 180/217]
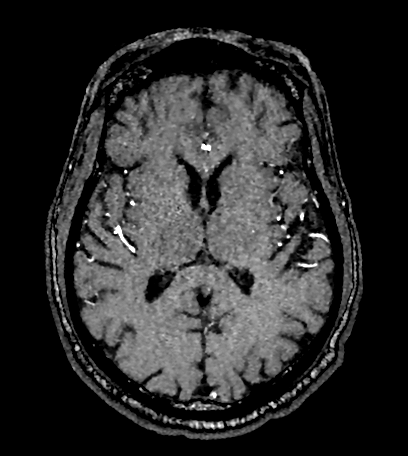
[im 184/217]
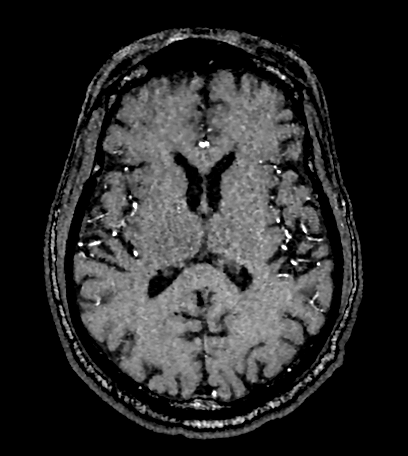
[im 207/217]
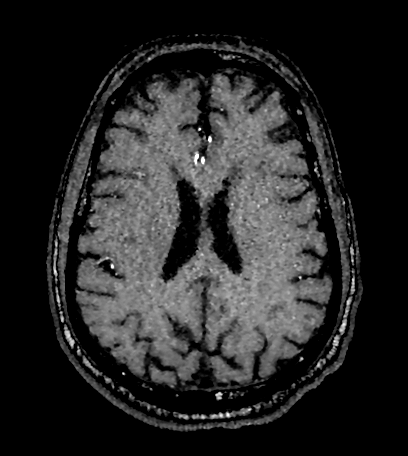

[26 of 48 positions shown; findings below may reference images not displayed]

FINDINGS: MRA NECK FINDINGS

The intracranial vertebral arteries are patent to the basilar.
Patent bilateral PICA, right AICA, and bilateral SCA origins are
visualized. A left AICA is not clearly identified. The basilar
artery is widely patent. There are large posterior communicating
arteries and hypoplastic P1 segments bilaterally. Both PCAs are
patent without evidence of a significant proximal stenosis.

The internal carotid arteries are widely patent from skull base to
carotid termini. ACAs and MCAs are patent without evidence of a
proximal branch occlusion or significant proximal stenosis. The
right A1 segment is hypoplastic. No aneurysm is identified.

MRA HEAD FINDINGS

Standard 3 vessel aortic arch. Widely patent brachiocephalic and
subclavian arteries.

The cervical carotid arteries are patent with atherosclerotic plaque
at the carotid bifurcations resulting in approximately 60% stenosis
of the right ICA origin and approximally 45% stenosis of the left
ICA origin.

The vertebral arteries are patent and codominant with antegrade flow
bilaterally. There may be a moderate stenosis of the right vertebral
origin. No significant stenosis is evident on the left.
IMPRESSION: 1. Negative head MRA.
2. Cervical carotid atherosclerosis with 60% right and 45% left ICA
origin stenoses.
3. Patent vertebral arteries with a possible moderate stenosis of
the right vertebral origin.

## 2021-08-10 IMAGING — MR MR MRA NECK WO/W CM
4 of 5 series · 36 of 48 positions shown · IV contrast (gadavist)
Comparison: CT neck [DATE].

CLINICAL DATA: Stroke/TIA, determine embolic source; Carotid artery
stenosis screening, risk factors. Syncope.

EXAM:
MRA NECK WITHOUT AND WITH CONTRAST
MRA HEAD WITHOUT CONTRAST
TECHNIQUE: Multiplanar and multiecho pulse sequences of the neck were obtained
without and with intravenous contrast. Angiographic images of the
neck were obtained using MRA technique without and with intravenous
contrast; Angiographic images of the Circle of Willis were obtained
using MRA technique without intravenous contrast.
CONTRAST:  6mL GADAVIST GADOBUTROL 1 MMOL/ML IV SOLN

[Series 18: angio_fl3d_cor_pre_ttc=2.0s · coronal · 0.9mm · 0.85mm/px · 9 of 96 slices shown]
[im 1/96]
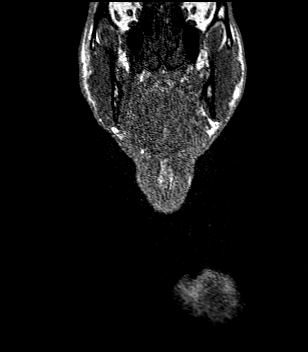
[im 12/96]
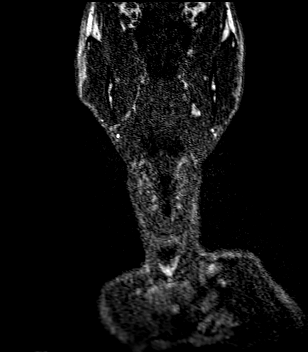
[im 24/96]
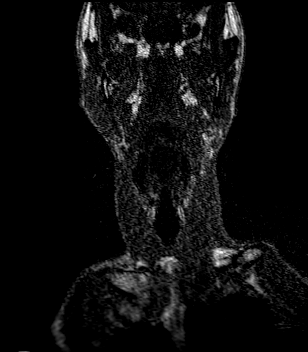
[im 36/96]
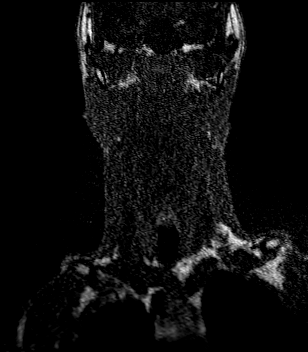
[im 48/96]
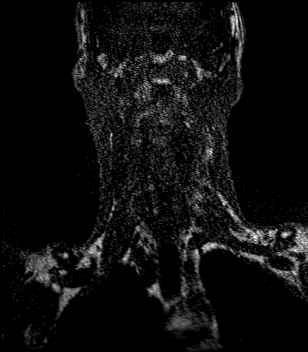
[im 60/96]
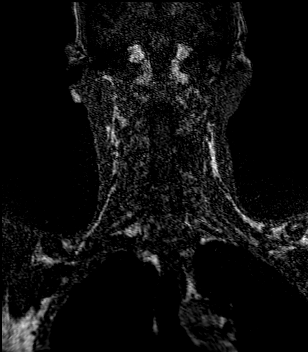
[im 72/96]
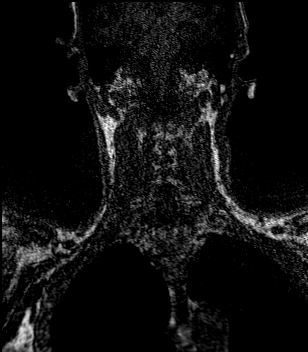
[im 84/96]
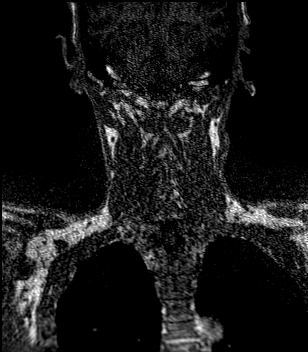
[im 96/96]
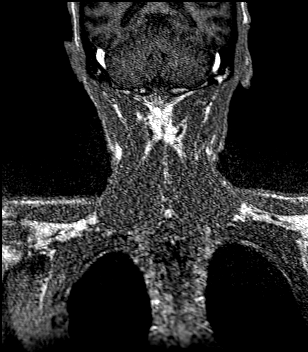

[Series 21: angio_fl3d_cor_post_ttc=2.0s · coronal · 0.9mm · 0.85mm/px · 9 of 96 slices shown]
[im 1/96]
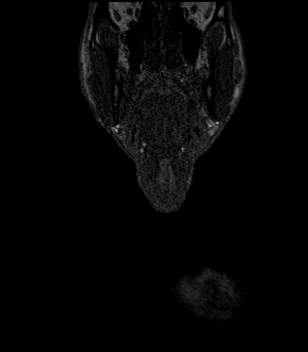
[im 12/96]
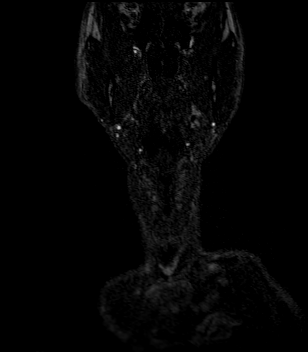
[im 24/96]
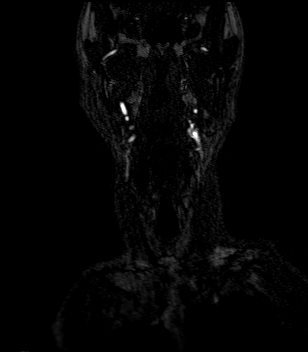
[im 36/96]
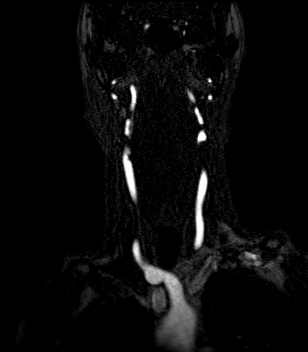
[im 48/96]
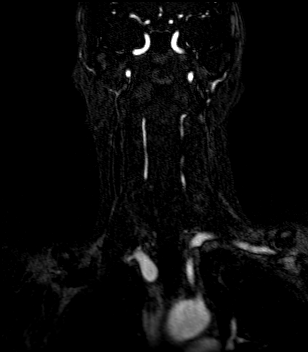
[im 60/96]
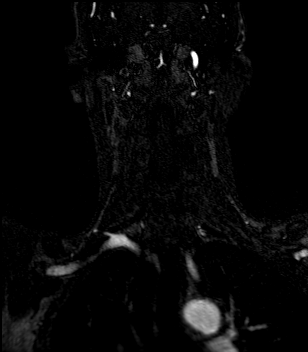
[im 72/96]
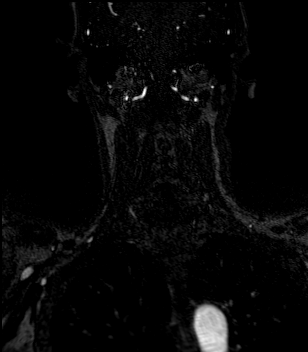
[im 84/96]
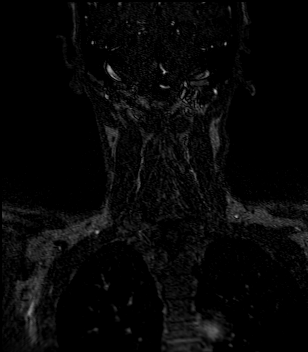
[im 96/96]
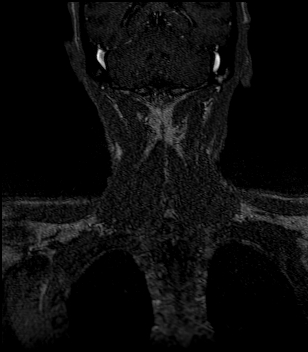

[Series 22: angio_fl3d_cor_post_ttc=2.0s_moco-adv · coronal · 0.9mm · 0.85mm/px · 9 of 96 slices shown]
[im 1/96]
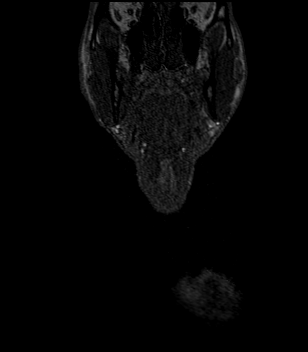
[im 12/96]
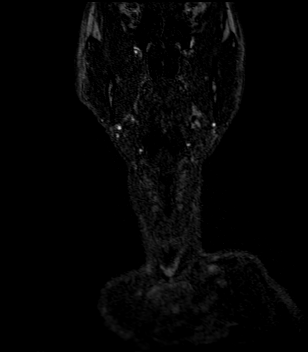
[im 24/96]
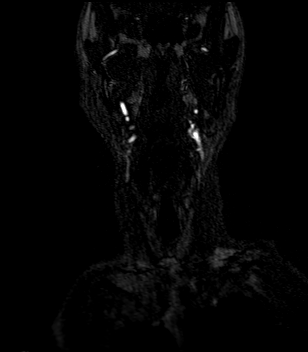
[im 36/96]
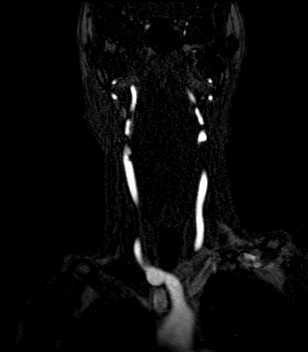
[im 48/96]
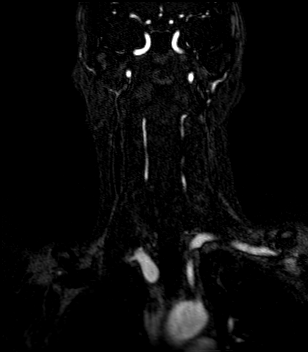
[im 60/96]
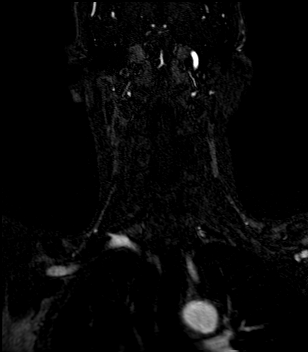
[im 72/96]
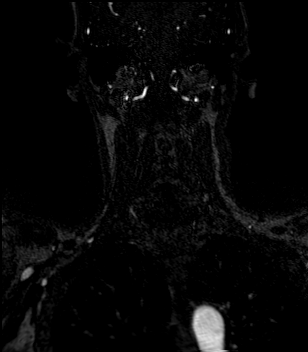
[im 84/96]
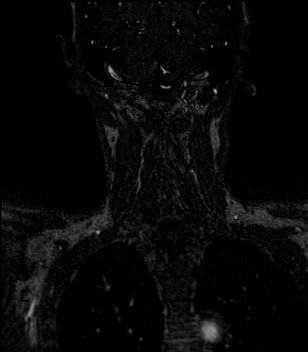
[im 96/96]
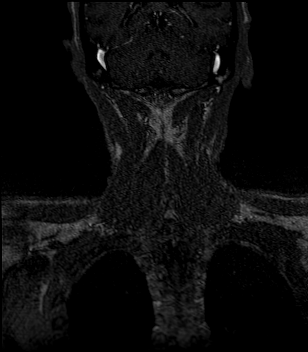

[Series 23: angio_fl3d_cor_post_ttc=2.0s_moco-adv_sub · coronal · 0.9mm · 0.85mm/px · 9 of 96 slices shown]
[im 1/96]
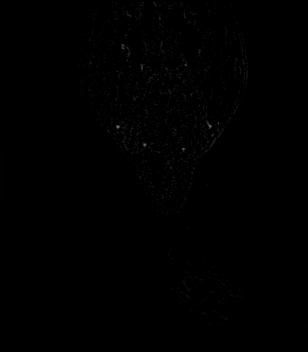
[im 12/96]
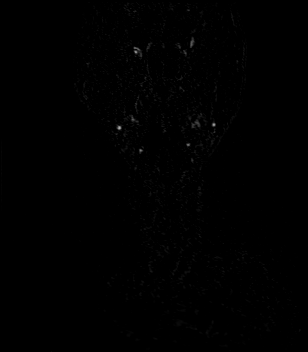
[im 24/96]
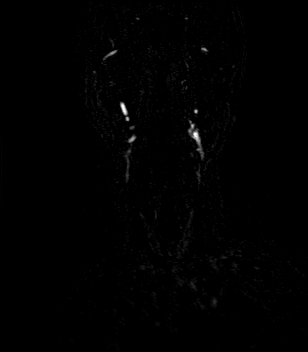
[im 36/96]
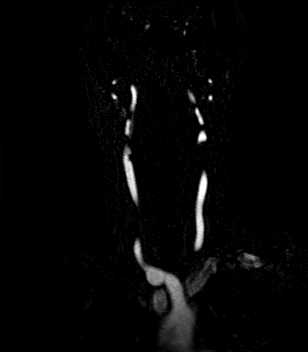
[im 48/96]
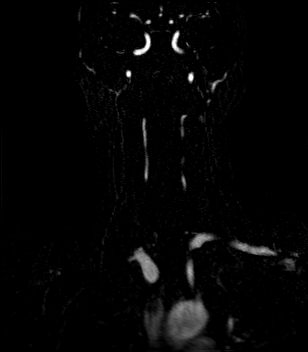
[im 60/96]
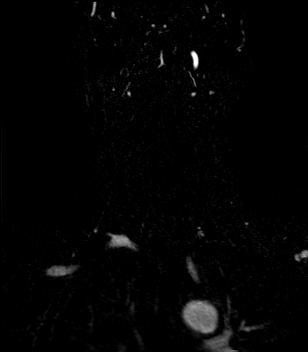
[im 72/96]
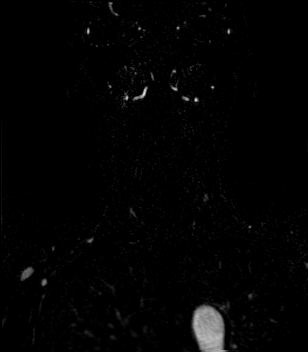
[im 84/96]
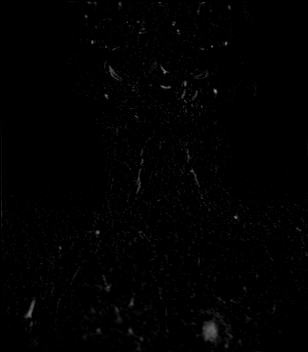
[im 96/96]
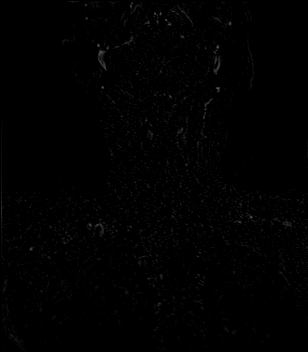

[36 of 48 positions shown; findings below may reference images not displayed]

FINDINGS: MRA NECK FINDINGS

The intracranial vertebral arteries are patent to the basilar.
Patent bilateral PICA, right AICA, and bilateral SCA origins are
visualized. A left AICA is not clearly identified. The basilar
artery is widely patent. There are large posterior communicating
arteries and hypoplastic P1 segments bilaterally. Both PCAs are
patent without evidence of a significant proximal stenosis.

The internal carotid arteries are widely patent from skull base to
carotid termini. ACAs and MCAs are patent without evidence of a
proximal branch occlusion or significant proximal stenosis. The
right A1 segment is hypoplastic. No aneurysm is identified.

MRA HEAD FINDINGS

Standard 3 vessel aortic arch. Widely patent brachiocephalic and
subclavian arteries.

The cervical carotid arteries are patent with atherosclerotic plaque
at the carotid bifurcations resulting in approximately 60% stenosis
of the right ICA origin and approximally 45% stenosis of the left
ICA origin.

The vertebral arteries are patent and codominant with antegrade flow
bilaterally. There may be a moderate stenosis of the right vertebral
origin. No significant stenosis is evident on the left.
IMPRESSION: 1. Negative head MRA.
2. Cervical carotid atherosclerosis with 60% right and 45% left ICA
origin stenoses.
3. Patent vertebral arteries with a possible moderate stenosis of
the right vertebral origin.

## 2021-08-10 IMAGING — MR MR HEAD W/O CM
12 series · 46 of 48 positions shown · non-contrast
Comparison: Prior CT from [DATE].

CLINICAL DATA: Initial evaluation for syncope/presyncope. Stroke
suspected.

EXAM:
MRI HEAD WITHOUT CONTRAST
TECHNIQUE: Multiplanar, multiecho pulse sequences of the brain and surrounding
structures were obtained without intravenous contrast.

[Series 5: ax dwi_tracew · axial · 3.0mm · 0.65mm/px · z∈[-121,+32]mm · 4 of 48 slices shown]
[im 1/48]
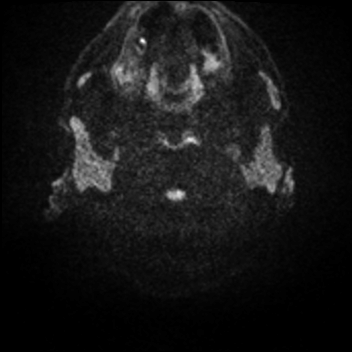
[im 16/48]
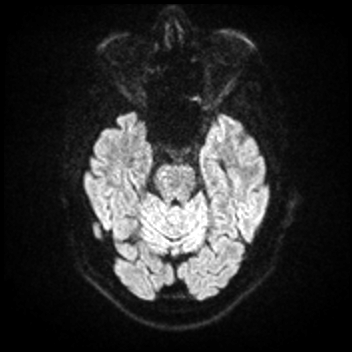
[im 32/48]
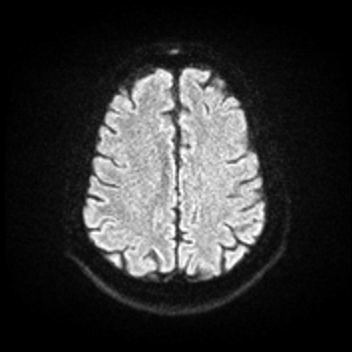
[im 48/48]
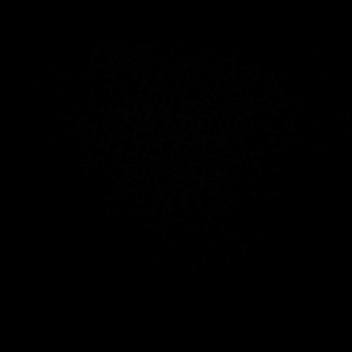

[Series 6: ax dwi_adc · axial · 3.0mm · 0.65mm/px · z∈[-121,+25]mm · 3 of 46 slices shown]
[im 1/46]
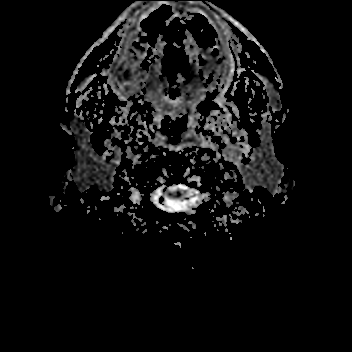
[im 23/46]
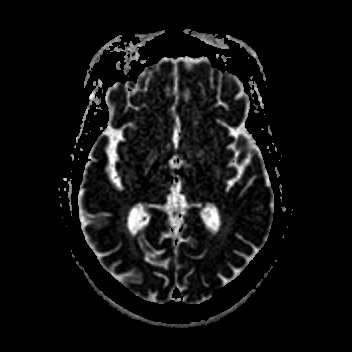
[im 46/46]
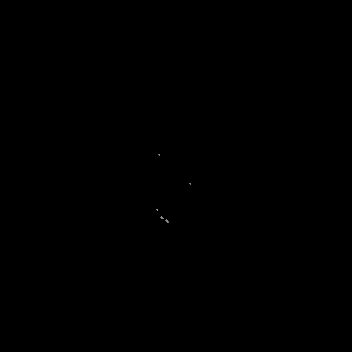

[Series 7: cor dwi_tracew · coronal · 5.0mm · 0.60mm/px · 2 of 34 slices shown]
[im 1/34]
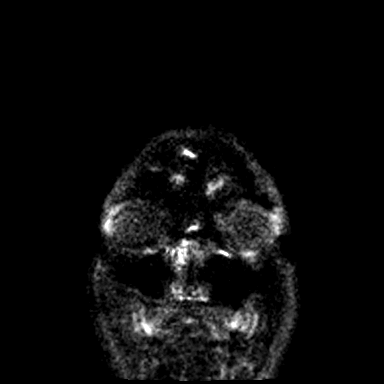
[im 34/34]
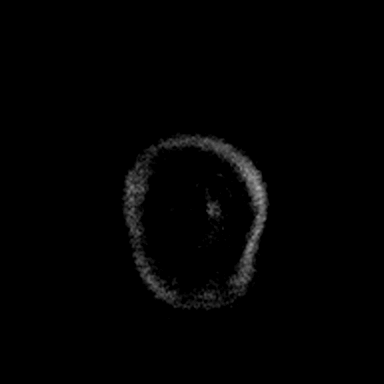

[Series 8: cor dwi_adc · coronal · 5.0mm · 0.60mm/px · 2 of 34 slices shown]
[im 1/34]
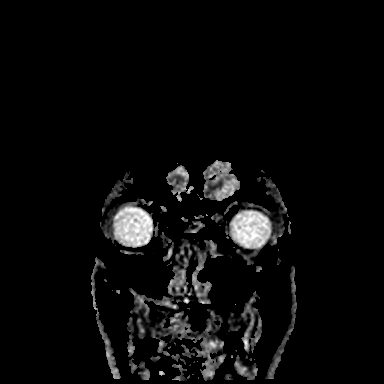
[im 34/34]
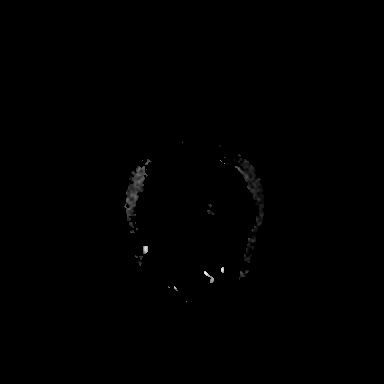

[Series 9: T1 · sagittal · 5.0mm · 0.62mm/px · 2 of 22 slices shown (1 of 2)]
[im 1/22]
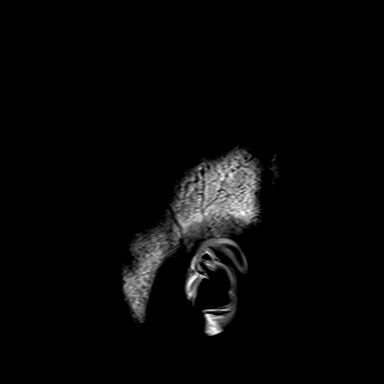
[im 22/22]
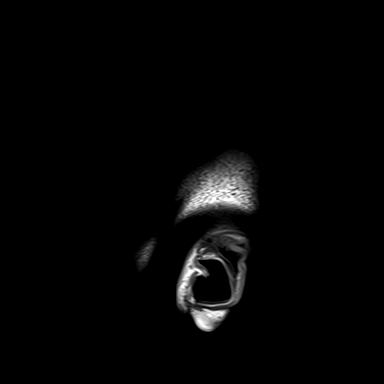

[Series 10: T2 · axial · 5.0mm · 0.53mm/px · z∈[-111,+25]mm · 2 of 24 slices shown (1 of 2)]
[im 1/24]
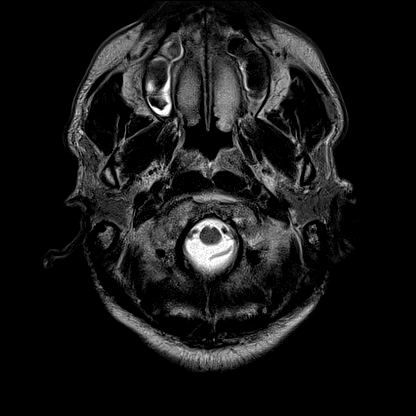
[im 24/24]
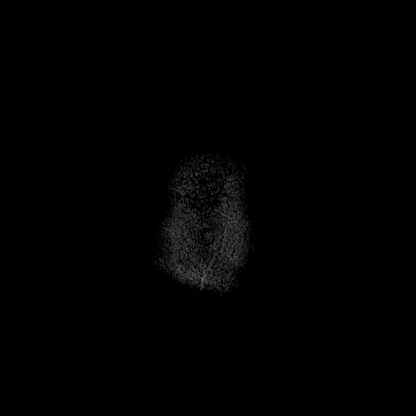

[Series 11: ax swi_mag · axial · 2.0mm · 0.90mm/px · z∈[-121,+34]mm · 6 of 80 slices shown]
[im 1/80]
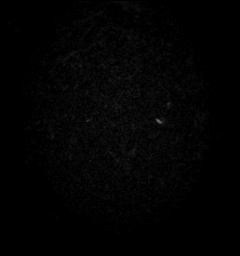
[im 16/80]
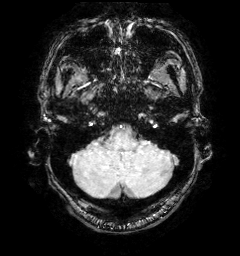
[im 32/80]
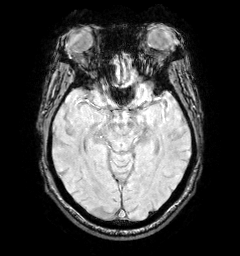
[im 48/80]
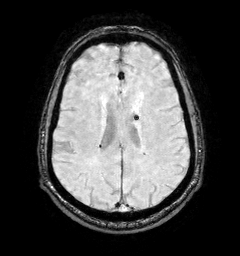
[im 64/80]
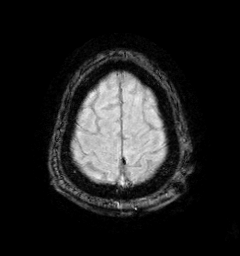
[im 80/80]
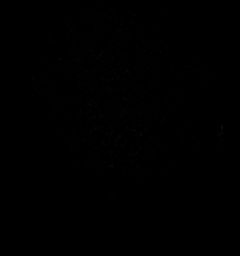

[Series 12: ax swi_pha · axial · 2.0mm · 0.90mm/px · z∈[-121,+34]mm · 6 of 80 slices shown]
[im 1/80]
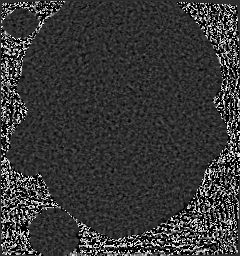
[im 16/80]
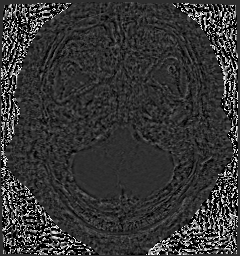
[im 32/80]
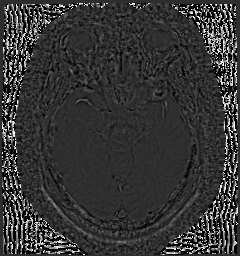
[im 48/80]
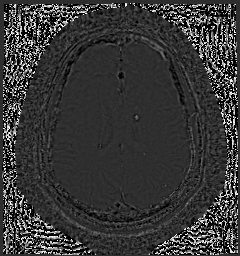
[im 64/80]
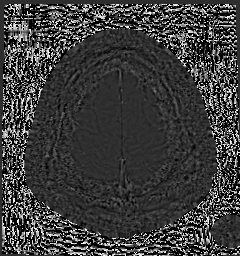
[im 80/80]
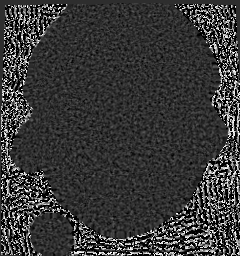

[Series 13: ax swi_swi · axial · 2.0mm · 0.90mm/px · z∈[-121,+34]mm · 6 of 80 slices shown]
[im 1/80]
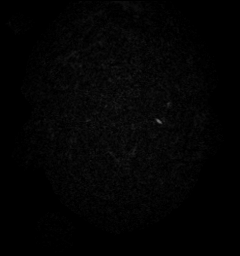
[im 16/80]
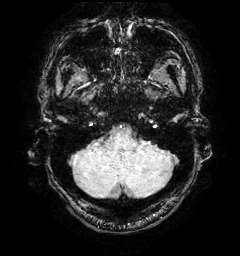
[im 32/80]
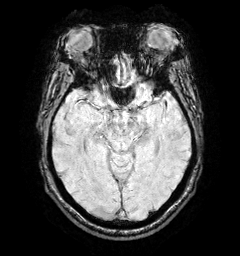
[im 48/80]
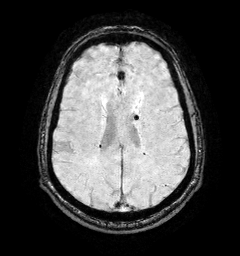
[im 64/80]
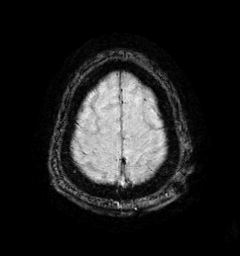
[im 80/80]
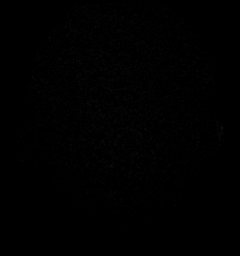

[Series 15: FLAIR · axial · 3.0mm · 0.53mm/px · z∈[-113,+26]mm · 3 of 48 slices shown]
[im 1/48]
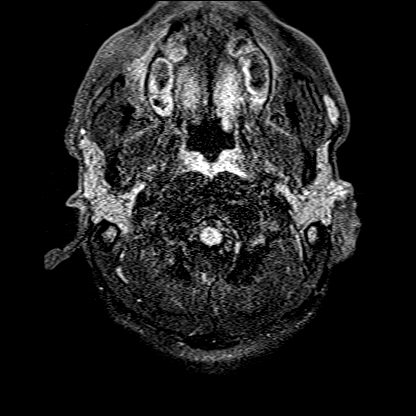
[im 24/48]
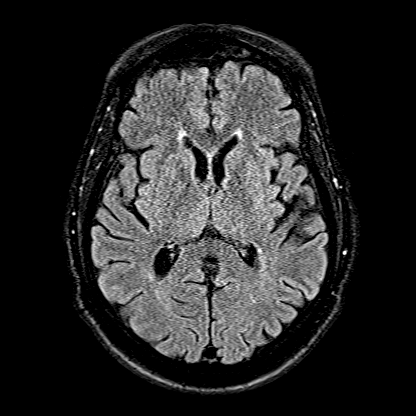
[im 48/48]
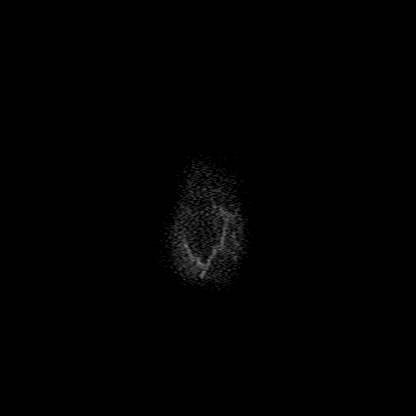

[Series 16: T1 · axial · 1.0mm · 0.98mm/px · z∈[-116,+24]mm · 8 of 144 slices shown (2 of 2)]
[im 1/144]
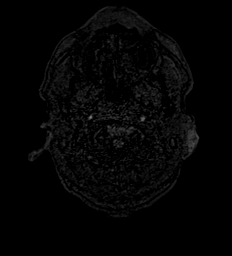
[im 16/144]
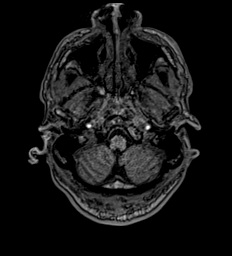
[im 48/144]
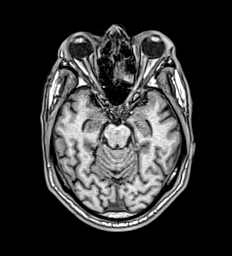
[im 64/144]
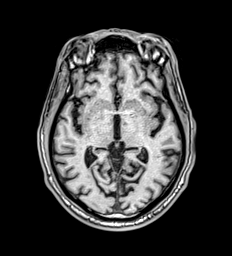
[im 80/144]
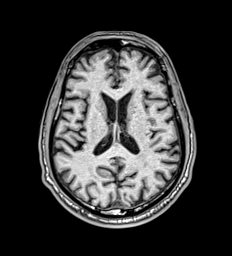
[im 96/144]
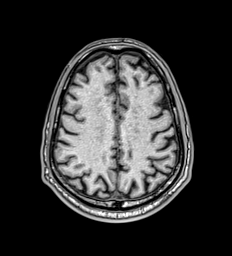
[im 128/144]
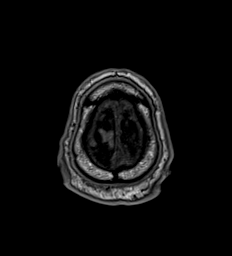
[im 144/144]
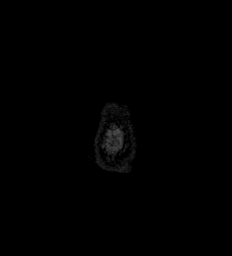

[Series 17: T2 · coronal · 5.0mm · 0.57mm/px · 2 of 26 slices shown (2 of 2)]
[im 1/26]
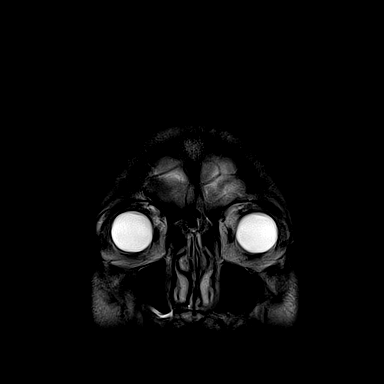
[im 26/26]
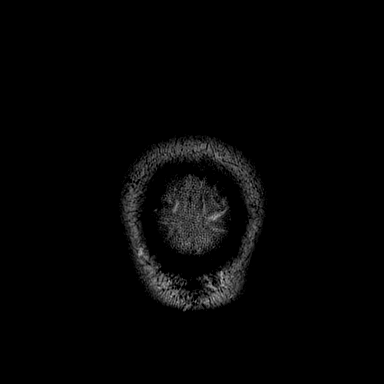

[46 of 48 positions shown; findings below may reference images not displayed]

FINDINGS: Brain: Cerebral volume within normal limits for age. Scattered
patchy T2/FLAIR hyperintensity involving the periventricular deep
white matter both cerebral hemispheres, most consistent with chronic
small vessel ischemic disease, mild in nature.

Patchy restricted diffusion involving the posterior left
periventricular white matter at the left frontoparietal region
consistent with an acute ischemic small vessel type infarct (series
5, images 25, 28). Infarct measures up to approximately 1.5 cm in
length (series 7, image 13). No associated hemorrhage or significant
regional mass effect. No other evidence for acute or subacute
ischemia. Gray-white matter differentiation otherwise maintained. No
acute intracranial hemorrhage. Few scattered chronic micro
hemorrhages noted about the left greater than right periventricular
white matter, likely related to chronic poorly controlled
hypertension.

No mass lesion, midline shift or mass effect. No hydrocephalus or
extra-axial fluid collection. Prominent dural calcifications noted.
Pituitary gland suprasellar region normal.

Vascular: Major intracranial vascular flow voids are maintained.

Skull and upper cervical spine: Craniocervical junction within
normal limits. Bone marrow signal intensity normal. Probable
subcentimeter benign hemangioma noted within the C2 vertebral body.
No scalp soft tissue abnormality.

Sinuses/Orbits: Globes orbital soft tissues within normal limits.
Scattered mucosal thickening about the ethmoidal air cells and
maxillary sinuses. No mastoid effusion.

Other: None.
IMPRESSION: 1. 1.5 cm acute ischemic infarct involving the posterior left
periventricular white matter at the left frontoparietal region. No
associated hemorrhage or significant regional mass effect.
2. Underlying mild chronic microvascular ischemic disease.
3. Few scattered chronic micro hemorrhages about the left greater
than right periventricular white matter, likely related to chronic
poorly controlled hypertension.

## 2021-08-10 MED ORDER — PANTOPRAZOLE SODIUM 40 MG IV SOLR
40.0000 mg | Freq: Two times a day (BID) | INTRAVENOUS | Status: DC
  Administered 2021-08-10: 40 mg via INTRAVENOUS
  Filled 2021-08-10: qty 10

## 2021-08-10 MED ORDER — PANTOPRAZOLE SODIUM 40 MG PO TBEC
40.0000 mg | DELAYED_RELEASE_TABLET | Freq: Two times a day (BID) | ORAL | Status: DC
  Administered 2021-08-10 – 2021-08-11 (×3): 40 mg via ORAL
  Filled 2021-08-10 (×3): qty 1

## 2021-08-10 MED ORDER — ASPIRIN 81 MG PO CHEW
81.0000 mg | CHEWABLE_TABLET | Freq: Every day | ORAL | Status: DC
  Administered 2021-08-10 – 2021-08-11 (×2): 81 mg via ORAL
  Filled 2021-08-10 (×2): qty 1

## 2021-08-10 MED ORDER — INSULIN GLARGINE-YFGN 100 UNIT/ML ~~LOC~~ SOLN
15.0000 [IU] | Freq: Every day | SUBCUTANEOUS | Status: DC
  Administered 2021-08-10 – 2021-08-11 (×2): 15 [IU] via SUBCUTANEOUS
  Filled 2021-08-10 (×2): qty 0.15

## 2021-08-10 MED ORDER — HYDRALAZINE HCL 20 MG/ML IJ SOLN
10.0000 mg | Freq: Four times a day (QID) | INTRAMUSCULAR | Status: DC | PRN
Start: 2021-08-10 — End: 2021-08-11

## 2021-08-10 MED ORDER — CLOPIDOGREL BISULFATE 75 MG PO TABS
300.0000 mg | ORAL_TABLET | Freq: Once | ORAL | Status: AC
  Administered 2021-08-10: 300 mg via ORAL
  Filled 2021-08-10: qty 4

## 2021-08-10 MED ORDER — ATORVASTATIN CALCIUM 80 MG PO TABS
80.0000 mg | ORAL_TABLET | Freq: Every day | ORAL | Status: DC
  Administered 2021-08-10: 80 mg via ORAL
  Filled 2021-08-10: qty 1

## 2021-08-10 MED ORDER — CLOPIDOGREL BISULFATE 75 MG PO TABS
75.0000 mg | ORAL_TABLET | Freq: Every day | ORAL | Status: DC
  Administered 2021-08-11: 75 mg via ORAL
  Filled 2021-08-10: qty 1

## 2021-08-10 MED ORDER — GADOBUTROL 1 MMOL/ML IV SOLN
6.0000 mL | Freq: Once | INTRAVENOUS | Status: AC | PRN
  Administered 2021-08-10: 6 mL via INTRAVENOUS

## 2021-08-10 MED ORDER — STROKE: EARLY STAGES OF RECOVERY BOOK
Freq: Once | Status: AC
  Administered 2021-08-10: 1

## 2021-08-10 MED ORDER — AMLODIPINE BESYLATE 5 MG PO TABS: 10.0000 mg | ORAL_TABLET | Freq: Every day | ORAL | Status: DC

## 2021-08-10 MED ORDER — INSULIN ASPART 100 UNIT/ML IJ SOLN
0.0000 [IU] | Freq: Three times a day (TID) | INTRAMUSCULAR | Status: DC
  Administered 2021-08-10: 5 [IU] via SUBCUTANEOUS
  Administered 2021-08-10: 3 [IU] via SUBCUTANEOUS
  Filled 2021-08-10 (×2): qty 1

## 2021-08-10 MED ORDER — HYDRALAZINE HCL 20 MG/ML IJ SOLN: 20.0000 mg | Freq: Four times a day (QID) | INTRAMUSCULAR | Status: DC | PRN

## 2021-08-10 NOTE — Consult Note (Signed)
?Neurology Consultation ?Reason for Consult: Stroke on MRI ?Requesting Physician: Val Riles ? ?CC: Syncope and collapse ? ?History is obtained from: Patient and chart review  ? ?HPI: Nicholas Weiss is a 71 y.o. male with a past medical history significant for hypertension, hyperlipidemia, cocaine abuse, CKD, heart block, type 1 diabetes, multiple myeloma, anemia of chronic disease. ? ?History is somewhat limited by patient irritability.  He reports that he had been in his normal state of health other than being hospitalized recently for his stomach which had improved.  He reports he is no longer taking aspirin or Plavix, but was started on some special aspirin for his stomach and his last hospitalization, on review this medication is Zofran.  He reports he thinks his sugars are doing just fine, and that he checks them frequently.  He reports he has not used any cocaine recently, although he continues to use marijuana.  He denies any issues taking his statin, although he reports that he misses his night medications occasionally.  He denies any other transient neurological symptoms or other infectious symptoms. ? ?He notes that he was just fine, standing by his truck, started to slide down and was better by the time he got back up, but that family with him insisted he come to the ED. ? ?He is frustrated about having COVID and not being informed immediately about his stroke when his after his MRI was completed ? ? ?LKW: Unknown given patient asymptomatic from this stroke and syncope event not felt to be related ?tPA given?: No, patient asymptomatic ?Premorbid modified rankin scale:  ?    0 - No symptoms. ? ?ROS: All other review of systems was negative except as noted in the HPI.  ? ?Past Medical History:  ?Diagnosis Date  ? DKA (diabetic ketoacidoses) 04/06/2016  ? Hypercholesteremia   ? Hypertension   ? ?Past Surgical History:  ?Procedure Laterality Date  ? COLONOSCOPY WITH PROPOFOL N/A 02/01/2020  ? Procedure:  COLONOSCOPY WITH PROPOFOL;  Surgeon: Lin Landsman, MD;  Location: [facility] ENDOSCOPY;  Service: Gastroenterology;  Laterality: N/A;  ? ESOPHAGOGASTRODUODENOSCOPY  02/01/2020  ? Procedure: ESOPHAGOGASTRODUODENOSCOPY (EGD);  Surgeon: Lin Landsman, MD;  Location: [facility] ENDOSCOPY;  Service: Gastroenterology;;  ? KNEE SURGERY Right   ? Torn meniscus  ? KNEE SURGERY Left   ? ?Current Outpatient Medications  ?Medication Instructions  ? Alcohol Swabs (B-D SINGLE USE SWABS REGULAR) PADS Use to check blood sugar four times daily for type 1 diabetes  ? amLODipine (NORVASC) 10 mg, Oral, Daily  ? Blood Glucose Calibration (TRUE METRIX LEVEL 1) Low SOLN Use to check blood sugar four times daily for type 1 diabetes  ? Farxiga 10 mg, Oral, Daily  ? gabapentin (NEURONTIN) 300 mg, Oral, 2 times daily  ? insulin aspart (NOVOLOG) 100 UNIT/ML injection Inject up to 14 units three times daily before meals according to sliding scale  ? insulin degludec (TRESIBA FLEXTOUCH) 100 UNIT/ML FlexTouch Pen 15 units daily.  ? Insulin Syringe-Needle U-100 (INSULIN SYRINGE 1CC/31GX5/16") 31G X 5/16" 1 ML MISC For insulin injections up to 4 times daily  ? lisinopril (ZESTRIL) 2.5 mg, Oral, Daily  ? ondansetron (ZOFRAN) 4 mg, Oral, Every 8 hours PRN  ? pregabalin (LYRICA) 75 MG capsule Take 1 capsule by mouth twice daily  ? ? ? ?Family History  ?Problem Relation Age of Onset  ? Heart attack Father   ? Hypertension Sister   ? Cancer Sister   ? ? ?Social History:  reports that  he has never smoked. He has never used smokeless tobacco. He reports current alcohol use. He reports that he does not currently use drugs after having used the following drugs: Marijuana. ? ? ?Exam: ?Current vital signs: ?BP (!) 143/84   Pulse 70   Temp 97.6 ?F (36.4 ?C)   Resp 12   Ht _0  (1.778 m)   Wt 63.5 kg   SpO2 100%   BMI 20.09 kg/m?  ?Vital signs in last 24 hours: ?Temp:  [97.6 ?F (36.4 ?C)] 97.6 ?F (36.4 ?C) (05/13 1931) ?Pulse Rate:  [70-83] 70 (05/14  0600) ?Resp:  [10-24] 12 (05/14 0600) ?BP: (121-173)/(72-95) 143/84 (05/14 0600) ?SpO2:  [99 %-100 %] 100 % (05/14 0600) ?Weight:  [63.5 kg] 63.5 kg (05/13 1752) ? ? ?Physical Exam  ?Constitutional: Appears well-developed and well-nourished.  ?Psych: Affect irritable but he does cooperate with examination ?Eyes: No scleral injection ?HENT: No oropharyngeal obstruction.  ?MSK: no joint deformities.  ?Cardiovascular: Normal rate and regular rhythm.  ?Respiratory: Effort normal, non-labored breathing ?GI: Soft.  No distension. There is no tenderness.  ?Skin: Warm dry and intact visible skin ? ?Neuro: ?Mental Status: ?Patient is awake, alert, oriented to person, place, month, year, and situation. ?Patient is able to give a clear and coherent history. ?No signs of aphasia or neglect ?Cranial Nerves: ?II: Visual Fields are full. Pupils are equal, round, and reactive to light.   ?III,IV, VI: EOMI without ptosis or diploplia.  ?V: Facial sensation is symmetric to temperature and light touch ?VII: Facial movement is symmetric.  ?VIII: hearing is intact to voice ?X: Uvula elevates symmetrically ?XI: Shoulder shrug is symmetric. ?XII: tongue is midline without atrophy or fasciculations.  ?Motor: ?Tone is normal. Bulk is normal. 5/5 strength was present in all four extremities.  No pronator drift, no drift of the bilateral lower extremities ?Sensory: ?Sensation is symmetric to light touch and temperature in the arms and legs. ?Deep Tendon Reflexes: ?2+ and symmetric in the brachioradialis and patellae.  ?Cerebellar: ?FNF and HKS are intact bilaterally ?Gait:  ?Deferred  ? ?NIHSS total 0 ?Performed at 5/14 at 4 PM  ? ? ?I have reviewed labs in epic and the results pertinent to this consultation are: ? ?Basic Metabolic Panel: ?Recent Labs  ?Lab 08/09/21 ?1754 08/09/21 ?2252 08/10/21 ?0500  ?NA 137  --  134*  ?K 4.1  --  4.7  ?CL 104  --  108  ?CO2 26  --  24  ?GLUCOSE 78  --  212*  ?BUN 39*  --  31*  ?CREATININE 2.46* 2.01* 1.67*   ?CALCIUM 8.6*  --  7.7*  ?MG  --  2.1  --   ? ? ?CBC: ?Recent Labs  ?Lab 08/09/21 ?1754 08/09/21 ?2252 08/10/21 ?0500  ?WBC 2.5* 3.2* 2.9*  ?NEUTROABS 1.2*  --   --   ?HGB 8.9* 10.2* 8.6*  ?HCT 27.4* 31.9* 26.6*  ?MCV 97.2 97.0 95.0  ?PLT 170 163 168  ? ? ?Coagulation Studies: ?No results for input(s): LABPROT, INR in the last 72 hours.  ? ?Lab Results  ?Component Value Date  ? CHOL 118 03/15/2020  ? HDL 49 03/15/2020  ? [place] 53 03/15/2020  ? TRIG 80 03/15/2020  ? CHOLHDL 2.4 03/15/2020  ? ?Lab Results  ?Component Value Date  ? HGBA1C 10.2 (H) 08/09/2021  ?  ? ?I have reviewed the images obtained: ? ?MRI brain personally reviewed, agree with radiology: ?1. 1.5 cm acute ischemic infarct involving the posterior left ?periventricular white  matter at the left frontoparietal region. No ?associated hemorrhage or significant regional mass effect. ?2. Underlying mild chronic microvascular ischemic disease. ?3. Few scattered chronic micro hemorrhages about the left greater ?than right periventricular white matter, likely related to chronic ?poorly controlled hypertension. ? ?MRA brain personally reviewed, agree with radiology: ?1. Negative head MRA. ?2. Cervical carotid atherosclerosis with 60% right and 45% left ICA ?origin stenoses. ?3. Patent vertebral arteries with a possible moderate stenosis of ?the right vertebral origin. ? ?CT Ab/Pelv w/o contrast ?-  Tiny non obstructing right renal stone ? ?CXR negative ? ?ECHO: ? 1. Left ventricular ejection fraction, by estimation, is 60 to 65%. The  ?left ventricle has normal function. The left ventricle has no regional  ?wall motion abnormalities. There is mild concentric LVH with severe  ?asymmetric left ventricular hypertrophy of  ? the basal-septal segment (1.6 cm). LVOT gradient was not measured. Left  ?ventricular diastolic parameters are consistent with Grade II diastolic  ?dysfunction (pseudonormalization). The average left ventricular global  ?longitudinal strain is  -14.9 %.  ? 2. Right ventricular systolic function is normal. The right ventricular  ?size is normal. There is normal pulmonary artery systolic pressure.  ? 3. The mitral valve is normal in structure. Mild mit

## 2021-08-10 NOTE — Assessment & Plan Note (Signed)
Lab Results  ?Component Value Date  ? CREATININE 2.01 (H) 08/09/2021  ? CREATININE 2.46 (H) 08/09/2021  ? CREATININE 1.51 (H) 05/07/2021  ?Acute kidney injury on chronic kidney disease. ?As documented above. ?Attribute to hypovolemia, prerenal issues. ?Will obtain CT of the abdomen and pelvis to identify any hydronephrosis. ?Renally dose all needed medications and avoid contrast. ? ?

## 2021-08-10 NOTE — ED Notes (Signed)
Bladder scan showed 265m in bladder ?

## 2021-08-10 NOTE — Assessment & Plan Note (Signed)
?    Latest Ref Rng & Units 08/09/2021  ? 10:52 PM 08/09/2021  ?  5:54 PM 06/05/2021  ?  2:30 PM  ?CBC  ?WBC 4.0 - 10.5 K/uL 3.2   2.5     ?Hemoglobin 13.0 - 17.0 g/dL 10.2   8.9   9.3    ?Hematocrit 39.0 - 52.0 % 31.9   27.4   28.5    ?Platelets 150 - 400 K/uL 163   170     ? ?Follow CBC, type and screen, transfusion deemed appropriate. ?

## 2021-08-10 NOTE — Progress Notes (Signed)
?  Echocardiogram ?2D Echocardiogram has been performed. ? Nicholas Weiss ?08/10/2021, 11:20 AM ?

## 2021-08-10 NOTE — Progress Notes (Signed)
Inpatient Diabetes Program Recommendations ? ?AACE/ADA: New Consensus Statement on Inpatient Glycemic Control (2015) ? ?Target Ranges:  Prepandial:   less than 140 mg/dL ?     Peak postprandial:   less than 180 mg/dL (1-2 hours) ?     Critically ill patients:  140 - 180 mg/dL  ? ?Lab Results  ?Component Value Date  ? GLUCAP 214 (H) 08/10/2021  ? HGBA1C 10.2 (H) 08/09/2021  ? ? ?Review of Glycemic Control ? ?Diabetes history: DM1 ?Outpatient Diabetes medications: Tresiba 15 QD, Novolog 0-14 units TID, Farxiga 10 QD ?Current orders for Inpatient glycemic control: Semglee 15 QD, Novolog 0-15 TID ? ?HgbA1C - 10.2% ?Covid 19+ ?239, 214 mg/dL this am. ? ?Inpatient Diabetes Program Recommendations:   ? ?Insulin orders just started. ? ?Will continue to follow.  ? ?Thank you. ?Lorenda Peck, RD, LDN, CDE ?Inpatient Diabetes Coordinator ?[phone]  ? ? ? ? ?

## 2021-08-10 NOTE — Progress Notes (Signed)
SLP Cancellation Note ? ?Patient Details ?Name: Nicholas Weiss ?MRN: [id number] ?DOB: 12/20/1950 ? ? ?Cancelled treatment:       Reason Eval/Treat Not Completed: SLP screened, no needs identified, will sign off (Per chart review and conversation with PT, pt passed "[place]" and pt with no observed difficulty swallowing by PT. Additionally, per PT assessment and MD documentation, no cognitive deficits or difficulty with communication identified. SLP to sign off at this time.) ? ?Cherrie Gauze, M.S., CCC-SLP ?Speech-Language Pathologist ?[facility] ?[phone] ([place]) ? ?Quintella Baton ?08/10/2021, 1:56 PM ?

## 2021-08-10 NOTE — Progress Notes (Signed)
SLP Cancellation Note ? ?Patient Details ?Name: Nicholas Weiss ?MRN: [id number] ?DOB: 1950/08/12 ? ? ?Cancelled treatment:       Reason Eval/Treat Not Completed: Patient at procedure or test/unavailable  ? ?SLP consult received and appreciated. Chart review completed. Pt currently OTF for STAT MRA. Per chart review, pt passed "[facility] Dysphagia Screen." Will continue efforts as appropriate. ? ?Cherrie Gauze, M.S., CCC-SLP ?Speech-Language Pathologist ?[facility] ?[phone] ([place]) ? ?Quintella Baton ?08/10/2021, 12:20 PM ?

## 2021-08-10 NOTE — Progress Notes (Signed)
PHARMACIST - PHYSICIAN COMMUNICATION ? ?CONCERNING: IV to Oral Route Change Policy ? ?RECOMMENDATION: ?This patient is receiving pantoprazole by the intravenous route.  Based on criteria approved by the Pharmacy and Therapeutics Committee, the intravenous medication(s) is/are being converted to the equivalent oral dose form(s). ? ? ?DESCRIPTION: ?These criteria include: ?The patient is eating (either orally or via tube) and/or has been taking other orally administered medications for a least 24 hours ?The patient has no evidence of active gastrointestinal bleeding or impaired GI absorption (gastrectomy, short bowel, patient on TNA or NPO). ? ?If you have questions about this conversion, please contact the Pharmacy Department  ? ?Benita Gutter, RPH ?08/10/2021 8:36 AM  ?

## 2021-08-10 NOTE — Assessment & Plan Note (Signed)
Start patient on IV PPI and follow symptoms. ?

## 2021-08-10 NOTE — Progress Notes (Signed)
[facility] Progress Note ? ?Patient: Nicholas Weiss    DTO:[id number]  DOA: 08/09/2021    ? ?Date of Service: the patient was seen and examined on 08/10/2021 ? ?Chief Complaint  ?Patient presents with  ? Loss of Consciousness  ?  Pt sitting outside in chair for unknown amount of time, syncopal episode per family, diaphoretic at scene, positive for orthostatic vitals 90/58 sitting and 79/46 standing, pt A&O  ? ?Brief hospital course: ?Nicholas Weiss is a 71 y.o. male with medical history significant of hypertension, diabetes mellitus type 2, dyslipidemia, THC abuse, presenting with syncopal episode patient states that he was at home standing next to his truck he realized that he was sliding down and that he was out and lost consciousness while he was falling notes he did not hit his head. ?Patient otherwise does not report headaches blurred vision chest pain confusion incontinence tongue biting chills or feeling ill prior to this episode at home.  EKG shows a severe PR prolonged interview of 397 his previous EKG PR interval Was 172. ?MRI brain: 1. 1.5 cm acute ischemic infarct involving the posterior left ?periventricular white matter at the left frontoparietal region. No ?associated hemorrhage or significant regional mass effect. ?2. Underlying mild chronic microvascular ischemic disease. ?3. Few scattered chronic micro hemorrhages about the left greater ?than right periventricular white matter, likely related to chronic ?poorly controlled hypertension. ?  ?COVID-19 positive, without any respiratory symptoms ? ?Cardiology and neurology were consulted, patient admitted for further management as below ? ?Assessment and Plan: ? ?Acute CVA, patient presented with syncope and collapse ?MRI positive for 1.5 cm acute ischemic infarct left periventricular white matter at left frontoparietal region. ?SLP, PT and OT eval done, recommended no needs ?Patient has no neurological findings on exam ?MRA head and neck: 1.  Negative head MRA. 2. Cervical carotid atherosclerosis with 60% right and 45% left ICAorigin stenoses. 3. Patent vertebral arteries with a possible moderate stenosis of ?the right vertebral origin.  Follow with vascular surgery as an outpatient ?Started aspirin 81 mg p.o. daily, Lipitor 80 mg p.o. daily ?Neurology consulted recommended Plavix 300 mg x 1 dose followed by 20 mg p.o. daily ?Continue neurochecks every 4 hourly as per protocol ?TTE LVEF 60--65%, no wall motion abnormality, mild concentric LV with severe asymmetric LV hypertrophy of basal septal segment, grade 2 diastolic dysfunction ?Follow lipid profile ?Follow neurology for further recommendation ?  ? ?Heart block AV first degree ?EKG concerning for severe first-degree intraventricular block with a PR prolonged at 397. ?Previous EKG PR interval is 172.  Troponin slightly elevated could be demand ischemia, patient denies any chest pain ?Monitor patient on telemetry and cardiac telemetry unit. ?Cardiology consulted, TTE done as above.  Cardiology recommended Zio patch for 2 weeks as an outpatient, driving restriction.  No any other work-up as an inpatient. ?TSH 0.3, free T4 level 0.9, subclinical hypothyroid, follow with PCP. ?F/u ANA RPR Lyme disease CRP ESR 140 elevated ?  ? ?COVID-19 viral infection positive ?Patient denies any symptoms ?Continue to monitor vital signs ? ?Acute kidney injury on chronic kidney disease 3a ?Attribute to hypovolemia, prerenal issues. ?Renally dose all needed medications and avoid contrast. ?CT a/p : Tiny nonobstructing right renal stone. No other focal abnormality is noted  ?  ?Essential (primary) hypertension ?Home medication lisinopril, amlodipine, ?Held antihypertensive medications for now due to CVA, to a low permissive hypertension for 48 hours. ?Use IV hydralazine as needed ?Continue to monitor BP and titrate medications accordingly ? ?  ?  Type 1 diabetes mellitus with diabetic neuropathy, unspecified ([place]) ?We will  continue patient's home regimen with [name] and sliding scale insulin per glycemic protocol calculation. ?Patient is a type I diabetic with an positive anti-GAD. ?Hemoglobin A1c 10.2, poorly controlled diabetes, recommend to follow with PCP and endocrine as an outpatient ? ? ?PUD (peptic ulcer disease) ?Start patient on IV PPI and follow symptoms. ?  ?Anemia of chronic kidney failure, stage 3 (moderate) (HCC) ?Hb 10.2--8.6 could be dilutional low, continue to monitor CBC and transfuse if Hb less than 7 ? ? ?Body mass index is 20.09 kg/m?.  ?Interventions: ?  ?  ?Diet: Carb modified diet ?DVT Prophylaxis: Subcutaneous Heparin   ? ?Advance goals of care discussion: Full code ? ?Family Communication: family was NOT present at bedside, at the time of interview.  ?The pt provided permission to discuss medical plan with the family. Opportunity was given to ask question and all questions were answered satisfactorily.  ? ?Disposition:  ?Pt is from Home, admitted with syncope, found to have acute stroke, first-degree AV block, which precludes a safe discharge. ?Discharge to Home, when clinically stable, most likely tomorrow a.m. ? ?Subjective: No significant events overnight, patient did not know positive findings so I explained to him regarding MRI positive for CVA, and COVID-positive.  Patient did not accept the diagnosis and he was placed off.  Patient denies any focal deficits, denied any chest palpitations, no headache or dizziness.  Patient was able to walk around and go to the bathroom without any assistance. ?Patient was advised to stay in the hospital until work-up is complete and plan for disposition tomorrow a.m. ?Patient was not willing to stay, he might sign AMA. ? ?Physical Exam: ?General:  alert oriented to time, place, and person.  ?Appear in mild distress, affect anxious ?Eyes: PERRLA ?ENT: Oral Mucosa Clear, moist  ?Neck: no JVD,  ?Cardiovascular: S1 and S2 Present, no Murmur,  ?Respiratory: good  respiratory effort, Bilateral Air entry equal and Decreased, no Crackles, no wheezes ?Abdomen: Bowel Sound present, Soft and no tenderness,  ?Skin: no rashes ?Extremities: no Pedal edema, no calf tenderness ?Neurologic: without any new focal findings ?Gait not checked due to patient safety concerns ? ?Vitals:  ? 08/10/21 0459 08/10/21 0600 08/10/21 0900 08/10/21 1411  ?BP: 123/74 (!) 143/84  (!) 161/95  ?Pulse: 72 70  74  ?Resp: 10 12 14 18   ?Temp:    98.4 ?F (36.9 ?C)  ?TempSrc:    Oral  ?SpO2: 100% 100%  100%  ?Weight:      ?Height:      ? ? ?Intake/Output Summary (Last 24 hours) at 08/10/2021 1451 ?Last data filed at 08/09/2021 2317 ?Gross per 24 hour  ?Intake 1250 ml  ?Output 375 ml  ?Net 875 ml  ? ?Filed Weights  ? 08/09/21 1752  ?Weight: 63.5 kg  ? ? ?Data Reviewed: ?I have personally reviewed and interpreted daily labs, tele strips, imagings as discussed above. ?I reviewed all nursing notes, pharmacy notes, vitals, pertinent old records ?I have discussed plan of care as described above with RN and patient/family. ? ?CBC: ?Recent Labs  ?Lab 08/09/21 ?1754 08/09/21 ?2252 08/10/21 ?0500  ?WBC 2.5* 3.2* 2.9*  ?NEUTROABS 1.2*  --   --   ?HGB 8.9* 10.2* 8.6*  ?HCT 27.4* 31.9* 26.6*  ?MCV 97.2 97.0 95.0  ?PLT 170 163 168  ? ?Basic Metabolic Panel: ?Recent Labs  ?Lab 08/09/21 ?1754 08/09/21 ?2252 08/10/21 ?0500  ?NA 137  --  134*  ?  K 4.1  --  4.7  ?CL 104  --  108  ?CO2 26  --  24  ?GLUCOSE 78  --  212*  ?BUN 39*  --  31*  ?CREATININE 2.46* 2.01* 1.67*  ?CALCIUM 8.6*  --  7.7*  ?MG  --  2.1  --   ? ? ?Studies: ?CT ABDOMEN PELVIS WO CONTRAST ? ?Result Date: 08/10/2021 ?CLINICAL DATA:  Recent syncopal episode with orthostatic hypotension EXAM: CT ABDOMEN AND PELVIS WITHOUT CONTRAST TECHNIQUE: Multidetector CT imaging of the abdomen and pelvis was performed following the standard protocol without IV contrast. RADIATION DOSE REDUCTION: This exam was performed according to the departmental dose-optimization program which  includes automated exposure control, adjustment of the mA and/or kV according to patient size and/or use of iterative reconstruction technique. COMPARISON:  05/07/2021 FINDINGS: Lower chest: No acute abnormality. He

## 2021-08-10 NOTE — Consult Note (Signed)
?Cardiology Consultation:  ? ?Patient ID: Nicholas Weiss ?MRN: [id number]; DOB: Apr 30, 1950 ? ?Admit date: 08/09/2021 ?Date of Consult: 08/10/2021 ? ?PCP:  Nicholas Body, MD ?  ?[facility] Providers ?Cardiologist:  None    ? ? ?Patient Profile:  ? ?Nicholas Weiss is a 71 y.o. male with a hx of HTN, DM, HLD, THC use who is being seen 08/10/2021 for the evaluation of syncope at the request of Dr Nicholas Weiss. ? ?History of Present Illness:  ? ?Nicholas Weiss presented 08/09/2021 after losing consciousness while standing next to his truck. No palpitations around the time of the event. He did feel poorly prior to the episode. No seizure activity. ? ?He tells me he was recently hospitalized in [place] for a GI illness. He tells me that he was home several hours before he passed out. He felt fine before passing out. No palpitations. No sweating, lightheadedness. He woke up after he hit the ground. No injury.  ? ? ?Past Medical History:  ?Diagnosis Date  ? DKA (diabetic ketoacidoses) 04/06/2016  ? Hypercholesteremia   ? Hypertension   ? ? ?Past Surgical History:  ?Procedure Laterality Date  ? COLONOSCOPY WITH PROPOFOL N/A 02/01/2020  ? Procedure: COLONOSCOPY WITH PROPOFOL;  Surgeon: Nicholas Landsman, MD;  Location: [facility] ENDOSCOPY;  Service: Gastroenterology;  Laterality: N/A;  ? ESOPHAGOGASTRODUODENOSCOPY  02/01/2020  ? Procedure: ESOPHAGOGASTRODUODENOSCOPY (EGD);  Surgeon: Nicholas Landsman, MD;  Location: [facility] ENDOSCOPY;  Service: Gastroenterology;;  ? KNEE SURGERY Right   ? Torn meniscus  ? KNEE SURGERY Left   ?  ? ? ? ?Inpatient Medications: ?Scheduled Meds: ? aspirin  81 mg Oral Daily  ? atorvastatin  80 mg Oral QHS  ? [START ON 08/11/2021] clopidogrel  75 mg Oral Daily  ? heparin  5,000 Units Subcutaneous Q12H  ? insulin aspart  0-15 Units Subcutaneous TID WC  ? insulin glargine-yfgn  15 Units Subcutaneous Daily  ? pantoprazole  40 mg Oral BID  ? sodium chloride flush  3 mL Intravenous Q12H  ? ?Continuous  Infusions: ? ?PRN Meds: ?acetaminophen **OR** acetaminophen, hydrALAZINE ? ?Allergies:    ?Allergies  ?Allergen Reactions  ? Penicillins Other (See Comments)  ?  Has patient had a PCN reaction causing immediate rash, facial/tongue/throat swelling, SOB or lightheadedness with hypotension: No ?Has patient had a PCN reaction causing severe rash involving mucus membranes or skin necrosis: No ?Has patient had a PCN reaction that required hospitalization No ?Has patient had a PCN reaction occurring within the last 10 years: No ?If all of the above answers are "NO", then may proceed with Cephalosporin use. ? ?Other reaction(s): Other (see comments) ?Other reaction(s): Other (See Comments) ?Has patient had a PCN reaction causing immediate rash, facial/tongue/throat swelling, SOB or lightheadedness with hypotension: No ?Has patient had a PCN reaction causing severe rash involving mucus membranes or skin necrosis: No ?Has patient had a PCN reaction that required hospitalization No ?Has patient had a PCN reaction occurring within the last 10 years: No ?If all of the above answers are "NO", then may proceed with Cephalosporin use. ?Has patient had a PCN reaction causing immediate rash, facial/tongue/throat swelling, SOB or lightheadedness with hypotension: No ?Has patient had a PCN reaction causing severe rash involving mucus membranes or skin necrosis: No ?Has patient had a PCN reaction that required hospitalization No ?Has patient had a PCN reaction occurring within the last 10 years: No ?If all of the above answers are "NO", then may proceed with Cephalosporin use.  ? ? ?  Social History:   ?Social History  ? ?Socioeconomic History  ? Marital status: Single  ?  Spouse name: Not on file  ? Number of children: 3  ? Years of education: Not on file  ? Highest education level: High school graduate  ?Occupational History  ?  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  ?Tobacco Use  ? Smoking status: Never  ? Smokeless tobacco: Never  ?Vaping  Use  ? Vaping Use: Never used  ?Substance and Sexual Activity  ? Alcohol use: Yes  ?  Alcohol/week: 0.0 - 1.0 standard drinks  ?  Comment: "once every 2 months"  ? Drug use: Not Currently  ?  Types: Marijuana  ?  Comment: "none in a long time'  ? Sexual activity: Yes  ?  Birth control/protection: None  ?Other Topics Concern  ? Not on file  ?Social History Narrative  ? Not on file  ? ?Social Determinants of Health  ? ?Financial Resource Strain: Not on file  ?Food Insecurity: Not on file  ?Transportation Needs: Not on file  ?Physical Activity: Not on file  ?Stress: Not on file  ?Social Connections: Not on file  ?Intimate Partner Violence: Not on file  ?  ?Family History:   ? ?Family History  ?Problem Relation Age of Onset  ? Heart attack Father   ? Hypertension Sister   ? Cancer Sister   ?  ? ?ROS:  ?Please see the history of present illness.  ? ?All other ROS reviewed and negative.    ? ?Physical Exam/Data:  ? ?Vitals:  ? 08/10/21 0200 08/10/21 0459 08/10/21 0600 08/10/21 0900  ?BP: 121/72 123/74 (!) 143/84   ?Pulse: 83 72 70   ?Resp: (!) '24 10 12 14  '$ ?Temp:      ?SpO2: 100% 100% 100%   ?Weight:      ?Height:      ? ? ?Intake/Output Summary (Last 24 hours) at 08/10/2021 1237 ?Last data filed at 08/09/2021 2317 ?Gross per 24 hour  ?Intake 1250 ml  ?Output 375 ml  ?Net 875 ml  ? ? ?  08/09/2021  ?  5:52 PM 05/07/2021  ?  6:57 AM 02/27/2021  ? 12:45 PM  ?Last 3 Weights  ?Weight (lbs) 140 lb 160 lb 240 lb  ?Weight (kg) 63.504 kg 72.576 kg 108.863 kg  ?   ?Weiss mass index is 20.09 kg/m?.  ? ? ? ?General:  Well nourished, well developed, in no acute distress. Laying flat. ?HEENT: normal ?Neck: no JVD ?Vascular: No carotid bruits; Distal pulses 2+ bilaterally ?Cardiac:  normal S1, S2; RRR; no murmur  ?Lungs:  clear to auscultation bilaterally, no wheezing, rhonchi or rales  ?Abd: soft, nontender, no hepatomegaly  ?Ext: no edema ?Musculoskeletal:  No deformities, BUE and BLE strength normal and equal ?Skin: warm and dry  ?Neuro:   CNs 2-12 intact, no focal abnormalities noted ?Psych:  Normal affect  ? ?EKG:  The EKG was personally reviewed and demonstrates:   ? ?08/09/2021 ECG show sinus rhythm with markedly prolonged PR interval ?02/28/2021 ECG shows sinus rhythm with more normal appearing PR interval ? ? ?08/10/2021 Echo ?LVEF 60 ?Mild LVH with severe basal septal hypertrophy. No significant LVOT gradient. ?Mild MR ? ? ? ?Telemetry:  Telemetry was personally reviewed and demonstrates:  sinus rhythm. ? ? ? ?Laboratory Data: ? ?High Sensitivity Troponin:   ?Recent Labs  ?Lab 08/09/21 ?1754 08/09/21 ?2252  ?TROPONINIHS 23* 23*  ?   ?Chemistry ?Recent Labs  ?Lab 08/09/21 ?1754 08/09/21 ?  2252 08/10/21 ?0500  ?NA 137  --  134*  ?K 4.1  --  4.7  ?CL 104  --  108  ?CO2 26  --  24  ?GLUCOSE 78  --  212*  ?BUN 39*  --  31*  ?CREATININE 2.46* 2.01* 1.67*  ?CALCIUM 8.6*  --  7.7*  ?MG  --  2.1  --   ?GFRNONAA 28* 35* 44*  ?ANIONGAP 7  --  2*  ?  ?Recent Labs  ?Lab 08/09/21 ?1754 08/10/21 ?0500  ?PROT 8.6* 8.0  ?ALBUMIN 3.0* 2.6*  ?AST 15 16  ?ALT 13 11  ?ALKPHOS 41 41  ?BILITOT 0.5 1.0  ? ?Lipids No results for input(s): CHOL, TRIG, HDL, LABVLDL, LDLCALC, CHOLHDL in the last 168 hours.  ?Hematology ?Recent Labs  ?Lab 08/09/21 ?1754 08/09/21 ?2252 08/10/21 ?0500  ?WBC 2.5* 3.2* 2.9*  ?RBC 2.82* 3.29* 2.80*  ?HGB 8.9* 10.2* 8.6*  ?HCT 27.4* 31.9* 26.6*  ?MCV 97.2 97.0 95.0  ?MCH 31.6 31.0 30.7  ?MCHC 32.5 32.0 32.3  ?RDW 13.3 13.2 13.4  ?PLT 170 163 168  ? ?Thyroid  ?Recent Labs  ?Lab 08/09/21 ?2252  ?TSH 0.340*  ?FREET4 0.90  ?  ?BNPNo results for input(s): BNP, PROBNP in the last 168 hours.  ?DDimer No results for input(s): DDIMER in the last 168 hours. ? ? ?Radiology/Studies:  ?CT ABDOMEN PELVIS WO CONTRAST ? ?Result Date: 08/10/2021 ?CLINICAL DATA:  Recent syncopal episode with orthostatic hypotension EXAM: CT ABDOMEN AND PELVIS WITHOUT CONTRAST TECHNIQUE: Multidetector CT imaging of the abdomen and pelvis was performed following the standard protocol  without IV contrast. RADIATION DOSE REDUCTION: This exam was performed according to the departmental dose-optimization program which includes automated exposure control, adjustment of the mA and/or kV according to p

## 2021-08-10 NOTE — Evaluation (Signed)
Physical Therapy Evaluation ?Patient Details ?Name: Nicholas Weiss ?MRN: [id number] ?DOB: 1950/09/14 ?Today's Date: 08/10/2021 ? ?History of Present Illness ? presented to ER secondary to syncopal episode with + LOC; admitted for TIA/CVA work up.  MRI significant for L frontoparietal infarct; also noted COVID +  ?Clinical Impression ? Patient seated indep on bench/chair in ER room upon arrival to session; using R UE to manipulate utensils and eat lunch.    Alert and oriented; follows commands and agreeable to participation with session.  Denies pain.  Bilat UE/LE strength and ROM grossly symmetrical and WFL; no focal weakness, sensory or coordination deficit appreciated.  Able to complete sit/stand, basic transfers and gait (75') without assist device, indep.  Demonstrates reciprocal stepping pattern with good step height/length, good trunk rotation and arm swing; safetly completes head turns, negotiates obstacles and turns without difficulty. Endorses ambulating indep in room since admission; feels gait performance is baseline for him ?Appears to be at baseline level of functional ability; no acute PT needs identified at this time.  Patient in agreement.  Will complete orders at this time; please re-consult should needs change. ?   ? ?Recommendations for follow up therapy are one component of a multi-disciplinary discharge planning process, led by the attending physician.  Recommendations may be updated based on patient status, additional functional criteria and insurance authorization. ? ?Follow Up Recommendations No PT follow up ? ?  ?Assistance Recommended at Discharge None  ?Patient can return home with the following ?   ? ?  ?Equipment Recommendations    ?Recommendations for Other Services ?    ?  ?Functional Status Assessment Patient has not had a recent decline in their functional status  ? ?  ?Precautions / Restrictions Precautions ?Precautions: Fall ?Restrictions ?Weight Bearing Restrictions: No  ? ?   ? ?Mobility ? Bed Mobility ?  ?  ?  ?  ?  ?  ?  ?General bed mobility comments: seated indep on chair/bench in room ?  ? ?Transfers ?Overall transfer level: Independent ?Equipment used: None ?  ?  ?  ?  ?  ?  ?  ?General transfer comment: good LE strength/stability ?  ? ?Ambulation/Gait ?Ambulation/Gait assistance: Independent ?Gait Distance (Feet): 75 Feet ?Assistive device: None ?  ?  ?  ?  ?General Gait Details: reciprocal stepping pattern with good step height/length, good trunk rotation and arm swing; safetly completes head turns, negotiates obstacles and turns without difficulty. Endorses ambulating indep in room since admission; feels gait performance is baseline for him ? ?Stairs ?  ?  ?  ?  ?  ? ?Wheelchair Mobility ?  ? ?Modified Rankin (Stroke Patients Only) ?  ? ?  ? ?Balance Overall balance assessment: Modified Independent ?  ?Sitting balance-Leahy Scale: Normal ?  ?  ?  ?Standing balance-Leahy Scale: Normal ?  ?  ?  ?  ?  ?  ?  ?  ?High Level Balance Comments: maintans feet apart/eyes closed, feet together without difficulty; standing functional reach >6"; retrieves item from floor with mod indep.  Good safety and control throughout ?  ?  ?  ?   ? ? ? ?Pertinent Vitals/Pain Pain Assessment ?Pain Assessment: No/denies pain  ? ? ?Home Living Family/patient expects to be discharged to:: Private residence ?Living Arrangements: Spouse/significant other ?Available Help at Discharge: Family;Available 24 hours/day ?Type of Home: House ?Home Access: Stairs to enter ?Entrance Stairs-Rails: Can reach both ?Entrance Stairs-Number of Steps: 1 STE bil railing ?  ?Home Layout:  One level ?Home Equipment: Kasandra Knudsen - single point;Tub bench;Grab bars - toilet;Grab bars - tub/shower ?   ?  ?Prior Function Prior Level of Function : Independent/Modified Independent ?  ?  ?  ?  ?  ?  ?Mobility Comments: Indep with ADLs, household and community mobilization; denies fall history. ?  ?  ? ? ?Hand Dominance  ?   ? ?   ?Extremity/Trunk Assessment  ? Upper Extremity Assessment ?Upper Extremity Assessment: Overall WFL for tasks assessed (grossly 5/5 throughout; no focal weakness, sensory or coordination deficit) ?  ? ?Lower Extremity Assessment ?Lower Extremity Assessment: Overall WFL for tasks assessed (grossly 5/5 throughout; no focal weakness, sensory or coordination deficit) ?  ? ?   ?Communication  ? Communication: No difficulties  ?Cognition Arousal/Alertness: Awake/alert ?Behavior During Therapy: [facility] for tasks assessed/performed ?Overall Cognitive Status: Within Functional Limits for tasks assessed ?  ?  ?  ?  ?  ?  ?  ?  ?  ?  ?  ?  ?  ?  ?  ?  ?  ?  ?  ? ?  ?General Comments   ? ?  ?Exercises    ? ?Assessment/Plan  ?  ?PT Assessment Patient does not need any further PT services  ?PT Problem List   ? ?   ?  ?PT Treatment Interventions     ? ?PT Goals (Current goals can be found in the Care Plan section)  ?Acute Rehab PT Goals ?Patient Stated Goal: to return home ?PT Goal Formulation: All assessment and education complete, DC therapy ?Time For Goal Achievement: 08/10/21 ?Potential to Achieve Goals: Good ? ?  ?Frequency   ?  ? ? ?Co-evaluation   ?  ?  ?  ?  ? ? ?  ?AM-PAC PT "6 Clicks" Mobility  ?Outcome Measure Help needed turning from your back to your side while in a flat bed without using bedrails?: None ?Help needed moving from lying on your back to sitting on the side of a flat bed without using bedrails?: None ?Help needed moving to and from a bed to a chair (including a wheelchair)?: None ?Help needed standing up from a chair using your arms (e.g., wheelchair or bedside chair)?: None ?Help needed to walk in hospital room?: None ?Help needed climbing 3-5 steps with a railing? : None ?6 Click Score: 24 ? ?  ?End of Session   ?Activity Tolerance: Patient tolerated treatment well ?  ?  ?PT Visit Diagnosis: Muscle weakness (generalized) (M62.81);Difficulty in walking, not elsewhere classified (R26.2) ?  ? ?Time:  1152-1202 ?PT Time Calculation (min) (ACUTE ONLY): 10 min ? ? ?Charges:   PT Evaluation ?$PT Eval Low Complexity: 1 Low ?  ?  ?   ?Shown Dissinger H. Owens Shark, PT, DPT, NCS ?08/10/21, 1:09 PM ?[phone] ? ? ?

## 2021-08-10 NOTE — Assessment & Plan Note (Signed)
Differentials include TIA/CVA/orthostasis. ?We will obtain an MRI of the brain, 2D echocardiogram and carotid Dopplers. ?We will also follow patient's H&H. ?Aspiration and fall precautions. ?Neurochecks x24 hours. ?

## 2021-08-10 NOTE — Assessment & Plan Note (Signed)
Blood pressure (!) 173/95, pulse 73, temperature 97.6 ?F (36.4 ?C), resp. rate 15, height '5\' 10"'$  (1.778 m), weight 63.5 kg, SpO2 100 %. ? ?Vitals:  ? 08/09/21 1905 08/09/21 1931 08/09/21 2300  ?BP: (!) 168/94 (!) 152/82 (!) 173/95  ? ?We will continue patient on amlodipine in addition to as needed hydralazine. ?Lisinopril currently held secondary to AKI. ? ?

## 2021-08-10 NOTE — Assessment & Plan Note (Signed)
We will continue patient's home regimen with Nicholas Weiss and sliding scale insulin per glycemic protocol calculation. ?Patient is a type I diabetic with an positive anti-GAD. ?

## 2021-08-10 NOTE — ED Notes (Signed)
Patient is resting comfortably. Breathing easy and unlabored, will continue to monitor. ?

## 2021-08-10 NOTE — Assessment & Plan Note (Signed)
EKG concerning for severe first-degree intraventricular block with a PR prolonged at 397. ?Previous EKG PR interval is 172. ?Monitor patient on telemetry and cardiac telemetry unit. ?Cardiology consult Dr. Rockey Situ a.m. message sent. ?2D echocardiogram ordered for any structural issues. ?We will also obtain ANA RPR Lyme disease CRP ESR thyroid. ?

## 2021-08-11 DIAGNOSIS — R55 Syncope and collapse: Secondary | ICD-10-CM | POA: Diagnosis not present

## 2021-08-11 LAB — BASIC METABOLIC PANEL
Anion gap: 5 (ref 5–15)
BUN: 25 mg/dL — ABNORMAL HIGH (ref 8–23)
CO2: 24 mmol/L (ref 22–32)
Calcium: 8.4 mg/dL — ABNORMAL LOW (ref 8.9–10.3)
Chloride: 106 mmol/L (ref 98–111)
Creatinine, Ser: 1.39 mg/dL — ABNORMAL HIGH (ref 0.61–1.24)
GFR, Estimated: 55 mL/min — ABNORMAL LOW (ref >60)
Glucose, Bld: 112 mg/dL — ABNORMAL HIGH (ref 70–99)
Potassium: 4.3 mmol/L (ref 3.5–5.1)
Sodium: 135 mmol/L (ref 135–145)

## 2021-08-11 LAB — RPR: RPR Ser Ql: NONREACTIVE

## 2021-08-11 LAB — GLUCOSE, CAPILLARY
Glucose-Capillary: 104 mg/dL — ABNORMAL HIGH (ref 70–99)
Glucose-Capillary: 116 mg/dL — ABNORMAL HIGH (ref 70–99)

## 2021-08-11 MED ORDER — ASPIRIN 81 MG PO CHEW: 81.0000 mg | CHEWABLE_TABLET | Freq: Every day | ORAL | 11 refills | Status: DC

## 2021-08-11 MED ORDER — CLOPIDOGREL BISULFATE 75 MG PO TABS: 75.0000 mg | ORAL_TABLET | Freq: Every day | ORAL | 0 refills | Status: DC

## 2021-08-11 MED ORDER — ATORVASTATIN CALCIUM 80 MG PO TABS: 80.0000 mg | ORAL_TABLET | Freq: Every day | ORAL | 2 refills | Status: DC

## 2021-08-11 NOTE — Progress Notes (Signed)
Inpatient Diabetes Program Recommendations ? ?AACE/ADA: New Consensus Statement on Inpatient Glycemic Control (2015) ? ?Target Ranges:  Prepandial:   less than 140 mg/dL ?     Peak postprandial:   less than 180 mg/dL (1-2 hours) ?     Critically ill patients:  140 - 180 mg/dL  ? ?Lab Results  ?Component Value Date  ? GLUCAP 104 (H) 08/11/2021  ? HGBA1C 10.2 (H) 08/09/2021  ? ? ?Review of Glycemic Control ? Latest Reference Range & Units 08/10/21 11:20 08/10/21 16:39 08/10/21 20:53 08/11/21 04:53  ?Glucose-Capillary 70 - 99 mg/dL 179 (H) 82 96 104 (H)  ? ?Diabetes history:  ?DM1 ?Outpatient Diabetes medications:  ?Tresiba 20 units daily ?Novolog (up to 14 units daily with meals) ?Current orders for Inpatient glycemic control:  ?Novolog moderate tid with meals ?Semglee 15 units daily ?Inpatient Diabetes Program Recommendations:   ? ?Called and spoke with patient by phone regarding his DM management.  He states that his blood sugars have been okay at home.  Attempted to explain that his A1C was increased and that it indicated average blood sugars 243 mg/dL.  He states that he rarely takes SSI because the scale does not call for it?  May need set- meal coverage.  He states he has F/U appt. With MD this week.  He thinks it is his diabetes specialist.  No further questions at this time.  ? ?Thanks,  ?Adah Perl, RN, BC-ADM ?Inpatient Diabetes Coordinator ?Pager [phone]  (8a-5p) ? ? ? ?

## 2021-08-11 NOTE — Discharge Instructions (Signed)
Advised to follow-up with primary care physician in 1 week. ?Advised to follow-up with cardiology for Zio patch monitoring. ?Advised to follow-up with neurology following stroke. ?Advised to take aspirin and Plavix for 21 days followed by aspirin only therapy. ?

## 2021-08-11 NOTE — Discharge Summary (Signed)
Physician Discharge Summary  ?Nicholas Weiss TMA:[id number] DOB: [birth year] DOA: 08/09/2021 ? ?PCP: Nicholas Body, MD ? ?Admit date: 08/09/2021 ?Discharge date: 08/11/2021 ? ?Admitted From: Home ?Disposition: Home ? ?Recommendations for Outpatient Follow-up:  ?Follow up with PCP in 1-2 weeks ?Please obtain BMP/CBC in one week ?Advised to follow-up with cardiology for Zio patch monitoring. ?Advised to follow-up with neurology following stroke. ?Advised to take aspirin and Plavix for 21 days followed by aspirin only therapy. ? ?Home Health: None ?Equipment/Devices: None ? ?Discharge Condition: Stable ?CODE STATUS:Full code ?Diet recommendation: Heart Healthy  ? ?Brief Summary / Hospital course: ?Nicholas Weiss is a 71 y.o. male with medical history significant of hypertension, diabetes mellitus type 2, dyslipidemia, THC abuse, presenting with syncopal episode.  Patient states that he was at home standing next to his truck, he realized that he was sliding down and that he was out and lost consciousness while he was falling,  notes he did not hit his head. Patient otherwise does not report headaches, blurred vision, chest pain, confusion, incontinence, tongue biting, chills or feeling ill prior to this episode at home.  EKG shows severe PR prolonged interval of 397 his previous EKG PR interval Was 172. ?MRI brain: 1. 1.5 cm acute ischemic infarct involving the posterior left periventricular white matter at the left frontoparietal region. No associated hemorrhage or significant regional mass effect. COVID-19 positive, without any respiratory symptoms. ?Patient was admitted for further evaluation.Cardiology and neurology were consulted, cardiology recommended outpatient work-up Zio patch monitoring at discharge.  Neurologist recommended dual antiplatelet therapy for 21 days followed by aspirin only therapy, high-dose statins.  Outpatient neurology follow-up.  Patient has no respiratory symptoms given incidental COVID  positive.  Neurology and cardiology signed off.  Patient want to be discharged and patient is being discharged. ? ?Discharge Diagnoses:  ?Principal Problem: ?  Syncope and collapse ?Active Problems: ?  Heart block AV first degree ?  Acute renal failure with acute tubular necrosis superimposed on stage 3a chronic kidney disease ([place]) ?  Type 1 diabetes mellitus with diabetic neuropathy, unspecified ([place]) ?  Essential (primary) hypertension ?  PUD (peptic ulcer disease) ?  Anemia of chronic kidney failure, stage 3 (moderate) (HCC) ? ?Acute CVA, patient presented with syncope and collapse: ?MRI positive for 1.5 cm acute ischemic infarct left periventricular white matter at left frontoparietal region. ?SLP, PT and OT eval done, recommended no needs ?Patient has no neurological findings on exam ?MRA head and neck: 1. Negative head MRA. 2. Cervical carotid atherosclerosis with 60% right and 45% left ICAorigin stenoses. 3. Patent vertebral arteries with a possible moderate stenosis of the right vertebral origin.  Follow with vascular surgery as an outpatient ?Started aspirin 81 mg p.o. daily, Lipitor 80 mg p.o. daily ?Neurology consulted recommended Plavix 300 mg x 1 dose followed by 75 mg p.o. daily x 21 days ?Continue neurochecks every 4 hourly as per protocol ?TTE LVEF 60--65%, no wall motion abnormality, mild concentric LV with severe asymmetric LV hypertrophy of basal septal segment, grade 2 diastolic dysfunction ?  ?Heart block AV first degree ?EKG concerning for severe first-degree intraventricular block with a PR prolonged at 397. ?Previous EKG PR interval is 172.  Troponin slightly elevated could be demand ischemia, patient denies any chest pain ?Monitor patient on telemetry and cardiac telemetry unit. ?Cardiology consulted, TTE done as above.  Cardiology recommended Zio patch for 2 weeks as an outpatient, driving restriction.  No any other work-up as an inpatient. ?TSH 0.3, free T4  level 0.9, subclinical  hypothyroid, follow with PCP. ?F/u ANA RPR Lyme disease CRP ESR 140 elevated ?  ?  ?COVID-19 viral infection positive ?Patient denies any symptoms ?Continue to monitor vital signs ?  ?Acute kidney injury on chronic kidney disease 3a ?Attribute to hypovolemia, prerenal issues. ?Renally dose all needed medications and avoid contrast. ?CT a/p : Tiny nonobstructing right renal stone. No other focal abnormality is noted  ?Renal functions improving. ?  ?Essential (primary) hypertension ?Home medication lisinopril, amlodipine, ?Held antihypertensive medications for now due to CVA, to a low permissive hypertension for 48 hours. ?Use IV hydralazine as needed ?Continue to monitor BP and titrate medications accordingly ?  ?  ?Type 1 diabetes mellitus with diabetic neuropathy, unspecified ([place]) ?We will continue patient's home regimen with [name] and sliding scale insulin per glycemic protocol calculation. ?Patient is a type I diabetic with an positive anti-GAD. ?Hemoglobin A1c 10.2, poorly controlled diabetes, recommend to follow with PCP and endocrine as an outpatient ?  ?  ?PUD (peptic ulcer disease) ?Continue protonix. ?  ?Anemia of chronic kidney failure, stage 3 (moderate) (HCC) ?Hb 10.2--8.6 could be dilutional low, continue to monitor CBC and transfuse if Hb less than 7 ?  ?  ?Body mass index is 20.09 kg/m?.  ?Interventions: ? ?Discharge Instructions ? ?Discharge Instructions   ? ? Call MD for:  difficulty breathing, headache or visual disturbances   Complete by: As directed ?  ? Call MD for:  persistant dizziness or light-headedness   Complete by: As directed ?  ? Call MD for:  persistant nausea and vomiting   Complete by: As directed ?  ? Diet - low sodium heart healthy   Complete by: As directed ?  ? Diet Carb Modified   Complete by: As directed ?  ? Discharge instructions   Complete by: As directed ?  ? Advised to follow-up with primary care physician in 1 week. ?Advised to follow-up with cardiology for Zio patch  monitoring. ?Advised to follow-up with neurology following stroke. ?Advised to take aspirin and Plavix for 21 days followed by aspirin only therapy.  ? Increase activity slowly   Complete by: As directed ?  ? ?  ? ?Allergies as of 08/11/2021   ? ?   Reactions  ? Penicillins Other (See Comments)  ? Has patient had a PCN reaction causing immediate rash, facial/tongue/throat swelling, SOB or lightheadedness with hypotension: No ?Has patient had a PCN reaction causing severe rash involving mucus membranes or skin necrosis: No ?Has patient had a PCN reaction that required hospitalization No ?Has patient had a PCN reaction occurring within the last 10 years: No ?If all of the above answers are "NO", then may proceed with Cephalosporin use. ?Other reaction(s): Other (see comments) ?Other reaction(s): Other (See Comments) ?Has patient had a PCN reaction causing immediate rash, facial/tongue/throat swelling, SOB or lightheadedness with hypotension: No ?Has patient had a PCN reaction causing severe rash involving mucus membranes or skin necrosis: No ?Has patient had a PCN reaction that required hospitalization No ?Has patient had a PCN reaction occurring within the last 10 years: No ?If all of the above answers are "NO", then may proceed with Cephalosporin use. ?Has patient had a PCN reaction causing immediate rash, facial/tongue/throat swelling, SOB or lightheadedness with hypotension: No ?Has patient had a PCN reaction causing severe rash involving mucus membranes or skin necrosis: No ?Has patient had a PCN reaction that required hospitalization No ?Has patient had a PCN reaction occurring within the last 10  years: No ?If all of the above answers are "NO", then may proceed with Cephalosporin use.  ? ?  ? ?  ?Medication List  ?  ? ?STOP taking these medications   ? ?amLODipine 10 MG tablet ?Commonly known as: NORVASC ?  ?insulin aspart 100 UNIT/ML injection ?Commonly known as: novoLOG ?  ?pregabalin 75 MG capsule ?Commonly  known as: LYRICA ?  ? ?  ? ?TAKE these medications   ? ?aspirin 81 MG chewable tablet ?Chew 1 tablet (81 mg total) by mouth daily. ?Start taking on: Aug 12, 2021 ?  ?atorvastatin 80 MG tablet ?Commonly known as:

## 2021-08-11 NOTE — Plan of Care (Signed)
?  Problem: Education: ?Goal: Knowledge of disease or condition will improve ?Outcome: Adequate for Discharge ?Goal: Knowledge of secondary prevention will improve (SELECT ALL) ?Outcome: Adequate for Discharge ?Goal: Knowledge of patient specific risk factors will improve (INDIVIDUALIZE FOR PATIENT) ?Outcome: Adequate for Discharge ?Goal: Individualized Educational Video(s) ?Outcome: Adequate for Discharge ?  ?Problem: Coping: ?Goal: Will verbalize positive feelings about self ?Outcome: Adequate for Discharge ?Goal: Will identify appropriate support needs ?Outcome: Adequate for Discharge ?  ?Problem: Health Behavior/Discharge Planning: ?Goal: Ability to manage health-related needs will improve ?Outcome: Adequate for Discharge ?  ?Problem: Self-Care: ?Goal: Ability to participate in self-care as condition permits will improve ?Outcome: Adequate for Discharge ?Goal: Verbalization of feelings and concerns over difficulty with self-care will improve ?Outcome: Adequate for Discharge ?Goal: Ability to communicate needs accurately will improve ?Outcome: Adequate for Discharge ?  ?Problem: Nutrition: ?Goal: Risk of aspiration will decrease ?Outcome: Adequate for Discharge ?Goal: Dietary intake will improve ?Outcome: Adequate for Discharge ?  ?Problem: Intracerebral Hemorrhage Tissue Perfusion: ?Goal: Complications of Intracerebral Hemorrhage will be minimized ?Outcome: Adequate for Discharge ?  ?Problem: Ischemic Stroke/TIA Tissue Perfusion: ?Goal: Complications of ischemic stroke/TIA will be minimized ?Outcome: Adequate for Discharge ?  ?Problem: Education: ?Goal: Knowledge of General Education information will improve ?Description: Including pain rating scale, medication(s)/side effects and non-pharmacologic comfort measures ?Outcome: Adequate for Discharge ?  ?Problem: Health Behavior/Discharge Planning: ?Goal: Ability to manage health-related needs will improve ?Outcome: Adequate for Discharge ?  ?Problem: Clinical  Measurements: ?Goal: Ability to maintain clinical measurements within normal limits will improve ?Outcome: Adequate for Discharge ?Goal: Will remain free from infection ?Outcome: Adequate for Discharge ?Goal: Diagnostic test results will improve ?Outcome: Adequate for Discharge ?Goal: Respiratory complications will improve ?Outcome: Adequate for Discharge ?Goal: Cardiovascular complication will be avoided ?Outcome: Adequate for Discharge ?  ?Problem: Activity: ?Goal: Risk for activity intolerance will decrease ?Outcome: Adequate for Discharge ?  ?Problem: Nutrition: ?Goal: Adequate nutrition will be maintained ?Outcome: Adequate for Discharge ?  ?Problem: Coping: ?Goal: Level of anxiety will decrease ?Outcome: Adequate for Discharge ?  ?Problem: Elimination: ?Goal: Will not experience complications related to bowel motility ?Outcome: Adequate for Discharge ?Goal: Will not experience complications related to urinary retention ?Outcome: Adequate for Discharge ?  ?Problem: Pain Managment: ?Goal: General experience of comfort will improve ?Outcome: Adequate for Discharge ?  ?Problem: Safety: ?Goal: Ability to remain free from injury will improve ?Outcome: Adequate for Discharge ?  ?Problem: Skin Integrity: ?Goal: Risk for impaired skin integrity will decrease ?Outcome: Adequate for Discharge ?  ?

## 2021-08-11 NOTE — Progress Notes (Signed)
Assumed care of patient at 1900. A&O x4. RA. Reporting minor HA at bedtime resolved w/ tylenol. Pt refusing bed alarm overnight and safety precaution education reinforced. Medication administration per MAR. Full assessment as documented. Call bell within reach, pt making needs known.  ?

## 2021-08-11 NOTE — Progress Notes (Signed)
OT Cancellation Note ? ?Patient Details ?Name: Nicholas Weiss ?MRN: [id number] ?DOB: June 17, 1950 ? ? ?Cancelled Treatment:    Reason Eval/Treat Not Completed: OT screened, no needs identified, will sign off. Per chart review and staff report, pt is at baseline level of functional for mobility and self care tasks at this time. OT to complete orders. ? ?Darleen Crocker, [place], OTR/L , CBIS ?ascom [phone]  ?08/11/21, 9:08 AM  ?

## 2021-08-11 NOTE — Progress Notes (Signed)
Order to discharge pt home.  Discharge instructions/AVS given to patient and reviewed - education provided as needed.  Pt advised to call PCP and/or come back to the hospital if there are any problems. Pt verbalized understanding.    

## 2021-08-12 LAB — LYME DISEASE SEROLOGY W/REFLEX: Lyme Total Antibody EIA: NEGATIVE

## 2021-08-12 LAB — ANA W/REFLEX: Anti Nuclear Antibody (ANA): NEGATIVE

## 2021-08-12 LAB — RHEUMATOID FACTOR: Rheumatoid fact SerPl-aCnc: <10 IU/mL (ref <14.0)

## 2021-08-13 DIAGNOSIS — U071 COVID-19: Secondary | ICD-10-CM | POA: Insufficient documentation

## 2021-08-13 DIAGNOSIS — Z8673 Personal history of transient ischemic attack (TIA), and cerebral infarction without residual deficits: Secondary | ICD-10-CM | POA: Insufficient documentation

## 2021-08-14 ENCOUNTER — Encounter: Payer: Medicare (Managed Care) | Admitting: Cardiology

## 2021-08-18 DIAGNOSIS — E871 Hypo-osmolality and hyponatremia: Secondary | ICD-10-CM | POA: Insufficient documentation

## 2021-08-22 ENCOUNTER — Ambulatory Visit
Admission: RE | Admit: 2021-08-22 | Discharge: 2021-08-22 | Disposition: A | Discharge status: Home / Self Care | Payer: Medicare (Managed Care) | Source: Ambulatory Visit | Attending: Oncology | Admitting: Oncology

## 2021-08-22 ENCOUNTER — Inpatient Hospital Stay: Payer: Medicare (Managed Care)

## 2021-08-22 ENCOUNTER — Encounter: Payer: Self-pay | Admitting: Oncology

## 2021-08-22 ENCOUNTER — Inpatient Hospital Stay (HOSPITAL_BASED_OUTPATIENT_CLINIC_OR_DEPARTMENT_OTHER): Payer: Medicare (Managed Care) | Admitting: Oncology

## 2021-08-22 ENCOUNTER — Inpatient Hospital Stay: Payer: Medicare (Managed Care) | Attending: Oncology

## 2021-08-22 VITALS — BP 91/65 | HR 88 | Temp 97.9°F | Resp 18 | Wt 167.5 lb

## 2021-08-22 DIAGNOSIS — N183 Chronic kidney disease, stage 3 unspecified: Secondary | ICD-10-CM

## 2021-08-22 DIAGNOSIS — Z79899 Other long term (current) drug therapy: Secondary | ICD-10-CM | POA: Diagnosis not present

## 2021-08-22 DIAGNOSIS — D472 Monoclonal gammopathy: Secondary | ICD-10-CM

## 2021-08-22 DIAGNOSIS — Z7984 Long term (current) use of oral hypoglycemic drugs: Secondary | ICD-10-CM | POA: Diagnosis not present

## 2021-08-22 DIAGNOSIS — D631 Anemia in chronic kidney disease: Secondary | ICD-10-CM | POA: Insufficient documentation

## 2021-08-22 DIAGNOSIS — E1022 Type 1 diabetes mellitus with diabetic chronic kidney disease: Secondary | ICD-10-CM | POA: Insufficient documentation

## 2021-08-22 DIAGNOSIS — D649 Anemia, unspecified: Secondary | ICD-10-CM | POA: Diagnosis not present

## 2021-08-22 DIAGNOSIS — N1832 Chronic kidney disease, stage 3b: Secondary | ICD-10-CM | POA: Insufficient documentation

## 2021-08-22 DIAGNOSIS — Z794 Long term (current) use of insulin: Secondary | ICD-10-CM | POA: Insufficient documentation

## 2021-08-22 DIAGNOSIS — Z7982 Long term (current) use of aspirin: Secondary | ICD-10-CM | POA: Diagnosis not present

## 2021-08-22 LAB — FOLATE: Folate: 13.3 ng/mL (ref >5.9)

## 2021-08-22 LAB — IRON AND TIBC
Iron: 61 ug/dL (ref 45–182)
Saturation Ratios: 28 % (ref 17.9–39.5)
TIBC: 220 ug/dL — ABNORMAL LOW (ref 250–450)
UIBC: 159 ug/dL

## 2021-08-22 LAB — VITAMIN B12: Vitamin B-12: 682 pg/mL (ref 180–914)

## 2021-08-22 LAB — CBC WITH DIFFERENTIAL/PLATELET
Abs Immature Granulocytes: 0.02 10*3/uL (ref 0.00–0.07)
Basophils Absolute: 0 10*3/uL (ref 0.0–0.1)
Basophils Relative: 0 %
Eosinophils Absolute: 0.1 10*3/uL (ref 0.0–0.5)
Eosinophils Relative: 1 %
HCT: 28.2 % — ABNORMAL LOW (ref 39.0–52.0)
Hemoglobin: 9.1 g/dL — ABNORMAL LOW (ref 13.0–17.0)
Immature Granulocytes: 0 %
Lymphocytes Relative: 25 %
Lymphs Abs: 1.2 10*3/uL (ref 0.7–4.0)
MCH: 31.6 pg (ref 26.0–34.0)
MCHC: 32.3 g/dL (ref 30.0–36.0)
MCV: 97.9 fL (ref 80.0–100.0)
Monocytes Absolute: 0.5 10*3/uL (ref 0.1–1.0)
Monocytes Relative: 10 %
Neutro Abs: 3 10*3/uL (ref 1.7–7.7)
Neutrophils Relative %: 64 %
Platelets: 209 10*3/uL (ref 150–400)
RBC: 2.88 MIL/uL — ABNORMAL LOW (ref 4.22–5.81)
RDW: 13.9 % (ref 11.5–15.5)
WBC: 4.7 10*3/uL (ref 4.0–10.5)
nRBC: 0 % (ref 0.0–0.2)

## 2021-08-22 LAB — COMPREHENSIVE METABOLIC PANEL
ALT: 24 U/L (ref 0–44)
AST: 24 U/L (ref 15–41)
Albumin: 3 g/dL — ABNORMAL LOW (ref 3.5–5.0)
Alkaline Phosphatase: 62 U/L (ref 38–126)
Anion gap: 6 (ref 5–15)
BUN: 23 mg/dL (ref 8–23)
CO2: 24 mmol/L (ref 22–32)
Calcium: 8.1 mg/dL — ABNORMAL LOW (ref 8.9–10.3)
Chloride: 105 mmol/L (ref 98–111)
Creatinine, Ser: 1.52 mg/dL — ABNORMAL HIGH (ref 0.61–1.24)
GFR, Estimated: 50 mL/min — ABNORMAL LOW (ref >60)
Glucose, Bld: 96 mg/dL (ref 70–99)
Potassium: 4 mmol/L (ref 3.5–5.1)
Sodium: 135 mmol/L (ref 135–145)
Total Bilirubin: 0.3 mg/dL (ref 0.3–1.2)
Total Protein: 9 g/dL — ABNORMAL HIGH (ref 6.5–8.1)

## 2021-08-22 LAB — FERRITIN: Ferritin: 126 ng/mL (ref 24–336)

## 2021-08-22 IMAGING — CR DG BONE SURVEY MET
8 of 10 series · 8 of 10 positions shown · non-contrast
Comparison: CT [DATE]

CLINICAL DATA: Anemia of chronic renal disease.

EXAM:
METASTATIC BONE SURVEY

[chest pa]
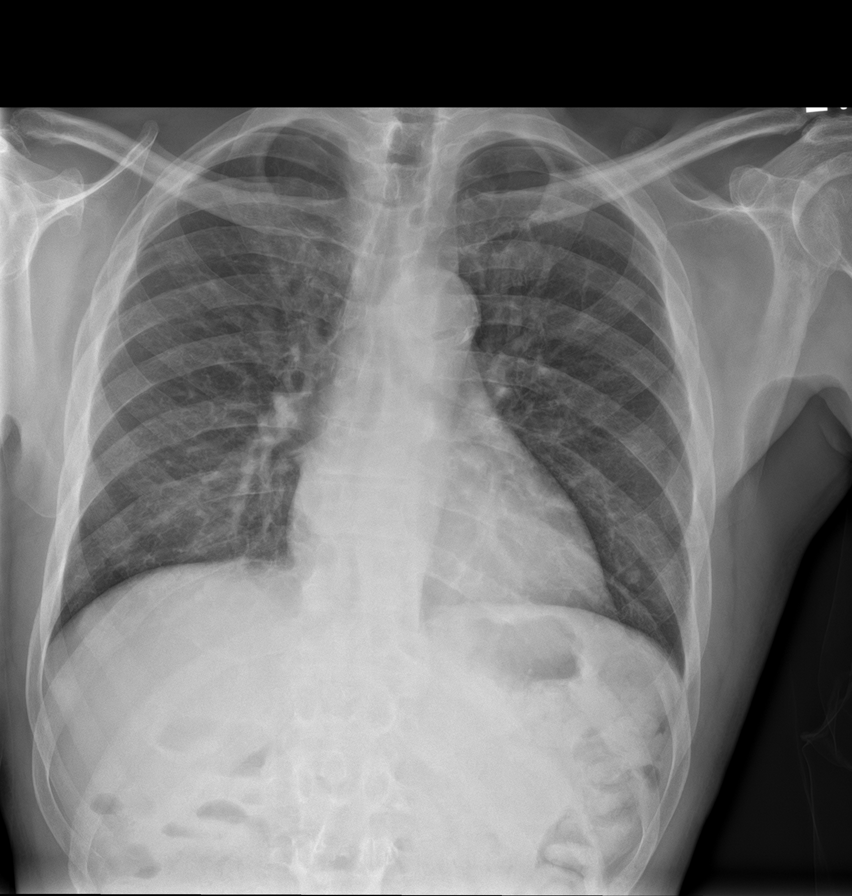

[c-spine lat]
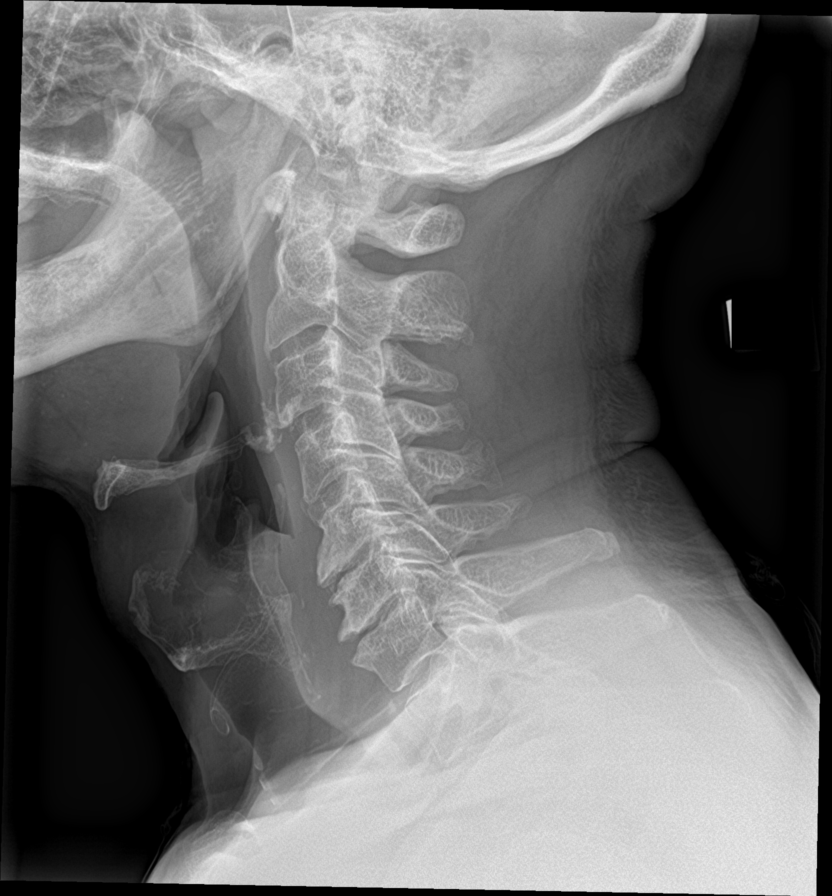

[c-spine swimmers]
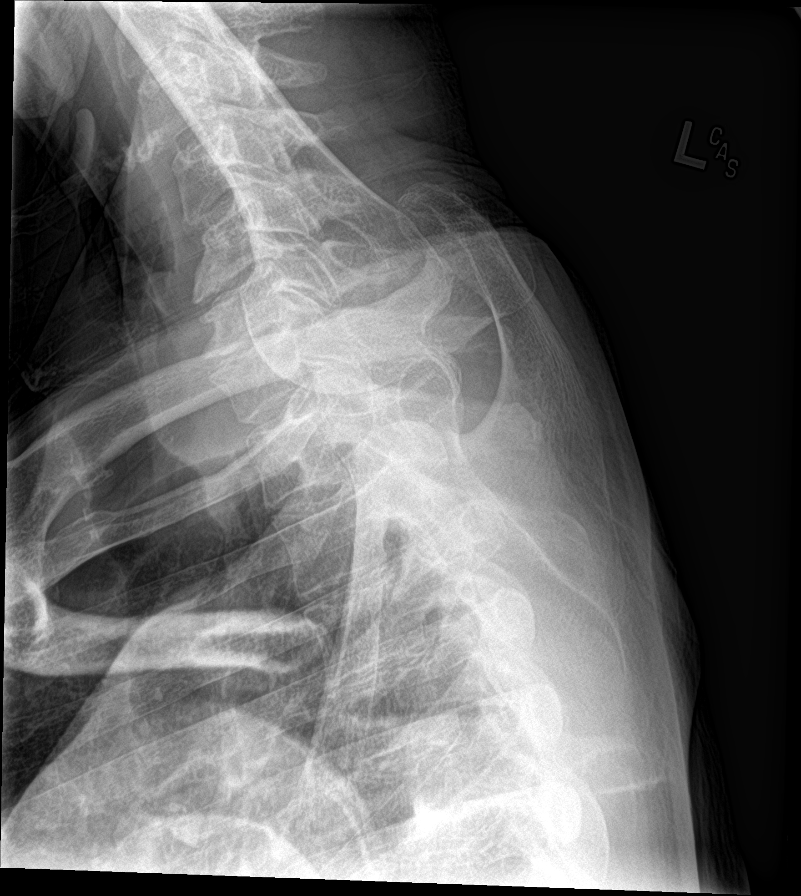

[skull lat]
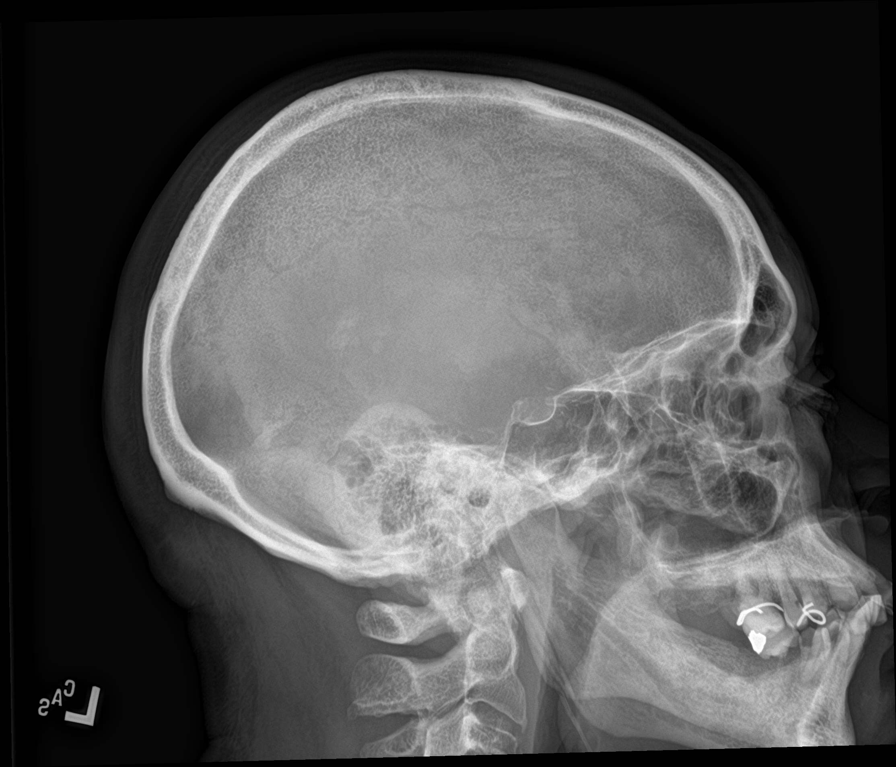

[c-spine ap]
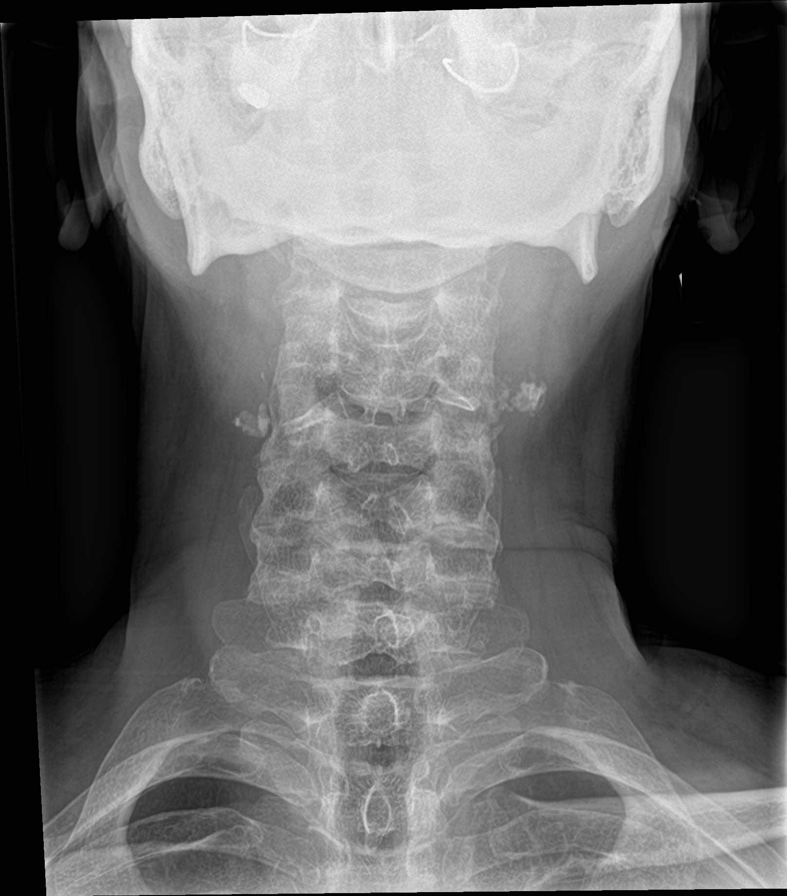

[shoulder ap (1 of 2)]
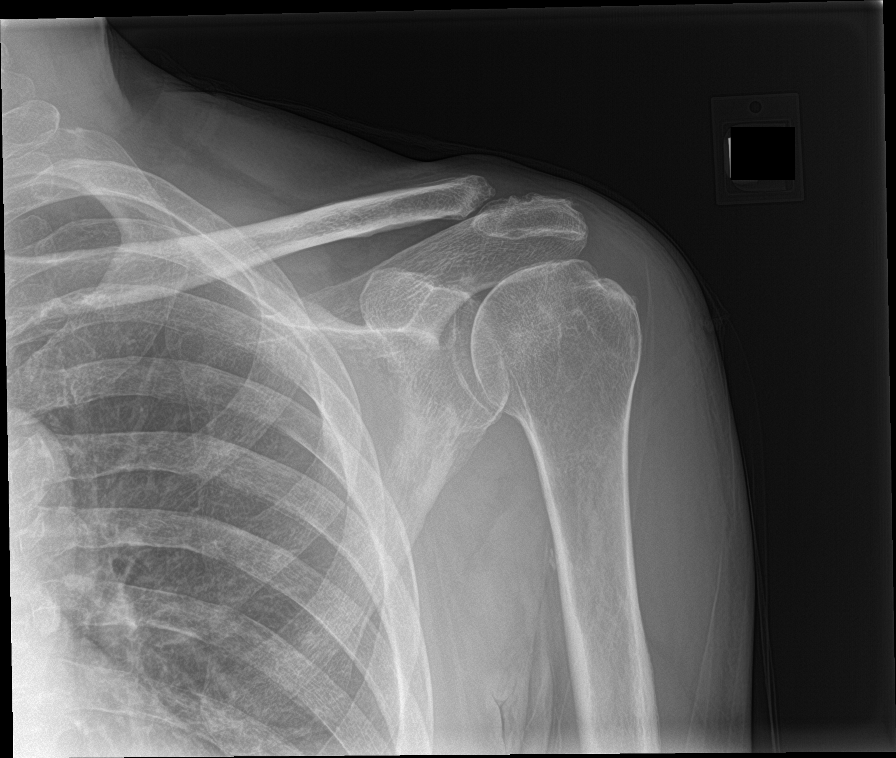

[shoulder ap (2 of 2)]
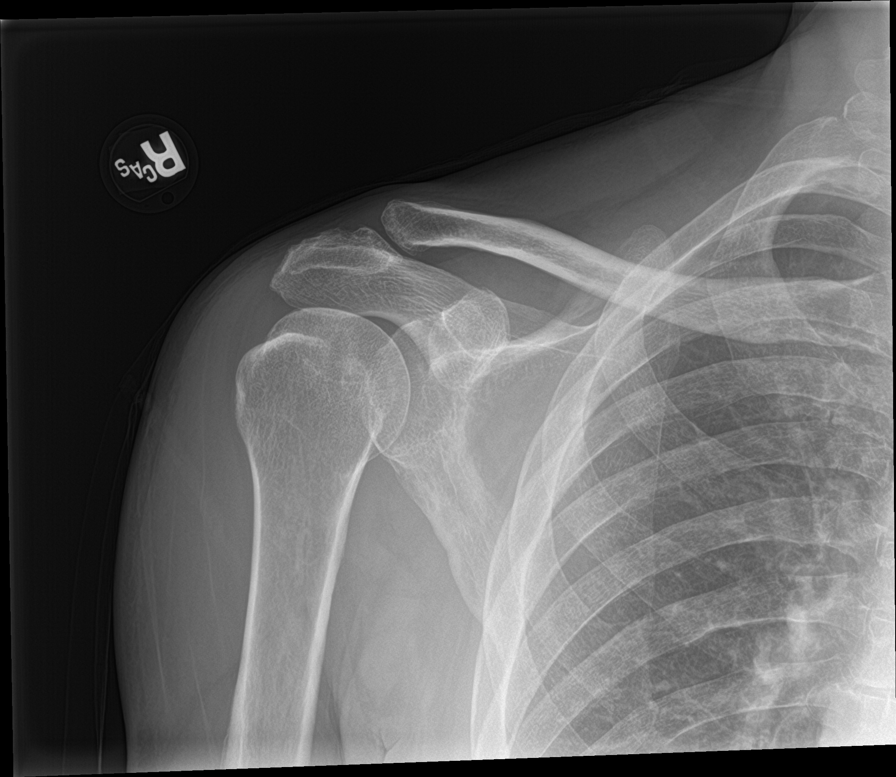

[humerus ap]
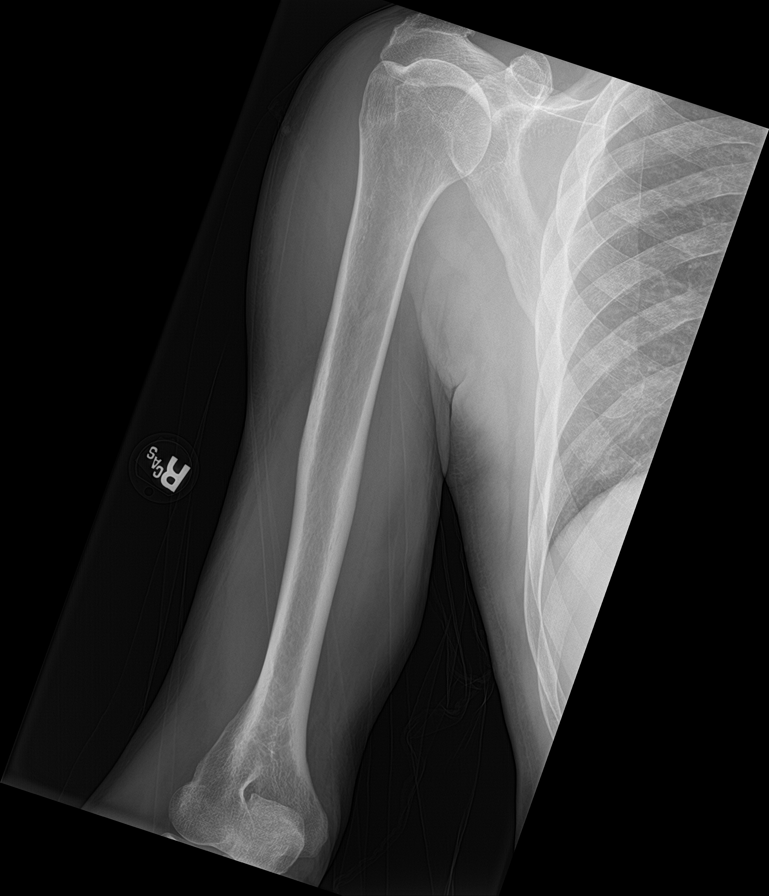

[8 of 10 positions shown; findings below may reference images not displayed]

FINDINGS: No lytic or sclerotic lesion identified in the calvarium. No lytic
or sclerotic lesion identified in the ribs or shoulder girdles. No
lytic or sclerotic lesion identified in the spine or pelvis. No
lytic or sclerotic lesion identified in the extremities.
IMPRESSION: No aggressive osseous lesion identified.

## 2021-08-22 MED ORDER — EPOETIN ALFA-EPBX 40000 UNIT/ML IJ SOLN
40000.0000 [IU] | Freq: Once | INTRAMUSCULAR | Status: AC
  Administered 2021-08-22: 40000 [IU] via SUBCUTANEOUS
  Filled 2021-08-22: qty 1

## 2021-08-22 NOTE — Progress Notes (Signed)
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 Patient's creatinine has been fluctuating between 1.5-2 but about 5 months ago it went up all the way to 5 and his blood sugars were in the 1000 range. More recently his kidney numbers have been drifting back to normal values. As a part of the work-up he had serum protein electrophoresis done which showed an elevated gammaglobulin fraction with an M spike of 3%. The amount of M spike was not quantified in the specimen. He has therefore been referred to [place] for further management. Patient endorses chronic fatigue reports that his appetite and weight have remained stable   Results of blood work from 04/08/2020 were as follows: CBC showed white count of 2.9, H&H of 10.5/31.2 with an MCV of 91 and a platelet count of 191.  CMP showed a mildly elevated creatinine of 1.2 which was better as compared to 5 months ago when it was 3.9.  Total protein was mildly elevated at 9.1 calcium normal at 8.4 ferritin and iron studies B12 folate TSH and haptoglobin were normal.  Myeloma panel revealed an elevated IgG level of 06/04/2004 with an M protein of 3.1 g.  Immunofixation  showed IgG lambda specificity.  Serum free light chain ratio was elevated at 25 and free light chain lambda elevated at 370   Further myeloma work-up including a PET CT scan did not reveal any evidence of edematous lesions.  Bone marrow biopsy showed 23% plasma cells by manual count and 30% by CD138 IHC.  Normal cytogenetics.  FISH studies for myeloma showed gain of 1 q.  13 q-. detected.  P53 not detected.    Interval history-patient was recently hospitalized forSyncopal episode and symptoms of gastroenteritis.  He is reporting feeling better since then.   ECOG PS- 1 Pain scale- 0 Opioid associated constipation- no  Review of systems- Review of Systems  Constitutional:  Positive for malaise/fatigue. Negative for chills, fever and weight loss.  HENT:  Negative for congestion, ear discharge and nosebleeds.   Eyes:  Negative for blurred vision.  Respiratory:  Negative for cough, hemoptysis, sputum production, shortness of breath and wheezing.   Cardiovascular:  Negative for chest pain, palpitations, orthopnea and claudication.  Gastrointestinal:  Negative for abdominal pain, blood in stool, constipation, diarrhea, heartburn, melena, nausea and vomiting.  Genitourinary:  Negative for dysuria, flank pain, frequency, hematuria and urgency.  Musculoskeletal:  Negative for back pain, joint pain and myalgias.  Skin:  Negative for rash.  Neurological:  Negative for dizziness, tingling, focal weakness, seizures, weakness and headaches.  Endo/Heme/Allergies:  Does not bruise/bleed easily.  Psychiatric/Behavioral:  Negative for depression and suicidal ideas. The patient does not have insomnia.       Allergies  Allergen Reactions   Penicillins Other (See Comments)    Has patient had a  PCN reaction causing immediate rash, facial/tongue/throat swelling, SOB or lightheadedness with hypotension: No Has patient had a PCN reaction causing severe rash involving mucus membranes or skin necrosis: No Has  patient had a PCN reaction that required hospitalization No Has patient had a PCN reaction occurring within the last 10 years: No If all of the above answers are "NO", then may proceed with Cephalosporin use.  Other reaction(s): Other (see comments) Other reaction(s): Other (See Comments) Has patient had a PCN reaction causing immediate rash, facial/tongue/throat swelling, SOB or lightheadedness with hypotension: No Has patient had a PCN reaction causing severe rash involving mucus membranes or skin necrosis: No Has patient had a PCN reaction that required hospitalization No Has patient had a PCN reaction occurring within the last 10 years: No If all of the above answers are "NO", then may proceed with Cephalosporin use. Has patient had a PCN reaction causing immediate rash, facial/tongue/throat swelling, SOB or lightheadedness with hypotension: No Has patient had a PCN reaction causing severe rash involving mucus membranes or skin necrosis: No Has patient had a PCN reaction that required hospitalization No Has patient had a PCN reaction occurring within the last 10 years: No If all of the above answers are "NO", then may proceed with Cephalosporin use.     Past Medical History:  Diagnosis Date   DKA (diabetic ketoacidoses) 04/06/2016   Hypercholesteremia    Hypertension      Past Surgical History:  Procedure Laterality Date   COLONOSCOPY WITH PROPOFOL N/A 02/01/2020   Procedure: COLONOSCOPY WITH PROPOFOL;  Surgeon: Lin Landsman, MD;  Location[facility] ENDOSCOPY;  Service: Gastroenterology;  Laterality: N/A;   ESOPHAGOGASTRODUODENOSCOPY  02/01/2020   Procedure: ESOPHAGOGASTRODUODENOSCOPY (EGD);  Surgeon: Lin Landsman, MD;  Location: [facility] ENDOSCOPY;  Service: Gastroenterology;;   KNEE SURGERY Right    Torn meniscus   KNEE SURGERY Left     Social History   Socioeconomic History   Marital status: Single    Spouse name: Not on file   Number of children: 3   Years of  education: Not on file   Highest education level: High school graduate  Occupational History    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  Tobacco Use   Smoking status: Never   Smokeless tobacco: Never  Vaping Use   Vaping Use: Never used  Substance and Sexual Activity   Alcohol use: Yes    Alcohol/week: 0.0 - 1.0 standard drinks    Comment: "once every 2 months"   Drug use: Not Currently    Types: Marijuana    Comment: "none in a long time'   Sexual activity: Yes    Birth control/protection: None  Other Topics Concern   Not on file  Social History Narrative   Not on file   Social Determinants of Health   Financial Resource Strain: Not on file  Food Insecurity: Not on file  Transportation Needs: Not on file  Physical Activity: Not on file  Stress: Not on file  Social Connections: Not on file  Intimate Partner Violence: Not on file    Family History  Problem Relation Age of Onset   Heart attack Father    Hypertension Sister    Cancer Sister      Current Outpatient Medications:    aspirin 81 MG chewable tablet, Chew 1 tablet (81 mg total) by mouth daily., Disp: 30 tablet, Rfl: 11   atorvastatin (LIPITOR) 80 MG tablet, Take 1 tablet (80 mg total) by mouth at bedtime., Disp: 30  tablet, Rfl: 2   Blood Glucose Calibration (TRUE METRIX LEVEL 1) Low SOLN, Use to check blood sugar four times daily for type 1 diabetes, Disp: 1 each, Rfl: 2   clopidogrel (PLAVIX) 75 MG tablet, Take 1 tablet (75 mg total) by mouth daily., Disp: 20 tablet, Rfl: 0   FARXIGA 10 MG TABS tablet, Take 10 mg by mouth daily., Disp: , Rfl:    gabapentin (NEURONTIN) 300 MG capsule, Take 300 mg by mouth 2 (two) times daily., Disp: , Rfl:    insulin degludec (TRESIBA FLEXTOUCH) 100 UNIT/ML FlexTouch Pen, 15 units daily. (Patient taking differently: Inject 20 Units into the skin daily. 20 units daily.), Disp: 30 mL, Rfl: 3   lisinopril (ZESTRIL) 2.5 MG tablet, Take 2.5 mg by mouth daily., Disp: , Rfl:    Alcohol Swabs  (B-D SINGLE USE SWABS REGULAR) PADS, Use to check blood sugar four times daily for type 1 diabetes, Disp: 300 each, Rfl: 4   Insulin Syringe-Needle U-100 (INSULIN SYRINGE 1CC/31GX5/16") 31G X 5/16" 1 ML MISC, For insulin injections up to 4 times daily, Disp: 270 each, Rfl: 4   ondansetron (ZOFRAN) 4 MG tablet, Take 1 tablet (4 mg total) by mouth every 8 (eight) hours as needed for nausea or vomiting. (Patient not taking: Reported on 08/22/2021), Disp: 20 tablet, Rfl: 0  Physical exam:  Vitals:   08/22/21 1037  BP: 91/65  Pulse: 88  Resp: 18  Temp: 97.9 F (36.6 C)  SpO2: 97%  Weight: 167 lb 8 oz (76 kg)   Physical Exam Constitutional:      General: He is not in acute distress. Cardiovascular:     Rate and Rhythm: Normal rate and regular rhythm.     Heart sounds: Normal heart sounds.  Pulmonary:     Effort: Pulmonary effort is normal.     Breath sounds: Normal breath sounds.  Abdominal:     General: Bowel sounds are normal.     Palpations: Abdomen is soft.  Skin:    General: Skin is warm and dry.  Neurological:     Mental Status: He is alert and oriented to person, place, and time.        Latest Ref Rng & Units 08/22/2021   10:14 AM  CMP  Glucose 70 - 99 mg/dL 96    BUN 8 - 23 mg/dL 23    Creatinine 0.61 - 1.24 mg/dL 1.52    Sodium 135 - 145 mmol/L 135    Potassium 3.5 - 5.1 mmol/L 4.0    Chloride 98 - 111 mmol/L 105    CO2 22 - 32 mmol/L 24    Calcium 8.9 - 10.3 mg/dL 8.1    Total Protein 6.5 - 8.1 g/dL 9.0    Total Bilirubin 0.3 - 1.2 mg/dL 0.3    Alkaline Phos 38 - 126 U/L 62    AST 15 - 41 U/L 24    ALT 0 - 44 U/L 24        Latest Ref Rng & Units 08/22/2021   10:14 AM  CBC  WBC 4.0 - 10.5 K/uL 4.7    Hemoglobin 13.0 - 17.0 g/dL 9.1    Hematocrit 39.0 - 52.0 % 28.2    Platelets 150 - 400 K/uL 209      No images are attached to the encounter.  CT ABDOMEN PELVIS WO CONTRAST  Result Date: 08/10/2021 CLINICAL DATA:  Recent syncopal episode with orthostatic  hypotension EXAM: CT ABDOMEN AND PELVIS WITHOUT CONTRAST TECHNIQUE:  Multidetector CT imaging of the abdomen and pelvis was performed following the standard protocol without IV contrast. RADIATION DOSE REDUCTION: This exam was performed according to the departmental dose-optimization program which includes automated exposure control, adjustment of the mA and/or kV according to patient size and/or use of iterative reconstruction technique. COMPARISON:  05/07/2021 FINDINGS: Lower chest: No acute abnormality. Hepatobiliary: No focal liver abnormality is seen. No gallstones, gallbladder wall thickening, or biliary dilatation. Pancreas: Unremarkable. No pancreatic ductal dilatation or surrounding inflammatory changes. Spleen: Normal in size without focal abnormality. Adrenals/Urinary Tract: Adrenal glands are within normal limits. Kidneys are well visualized bilaterally. Tiny nonobstructing right renal stone is noted in the upper pole. Ureters are within normal limits. The bladder is well distended. Stomach/Bowel: The appendix is within normal limits. No obstructive or inflammatory changes of the colon are seen. Small bowel and stomach are within normal limits. Vascular/Lymphatic: Aortic atherosclerosis. No enlarged abdominal or pelvic lymph nodes. Reproductive: Prostate is unremarkable. Other: No abdominal wall hernia or abnormality. No abdominopelvic ascites. Musculoskeletal: No acute or significant osseous findings. IMPRESSION: Tiny nonobstructing right renal stone. No other focal abnormality is noted. Electronically Signed   By: Inez Catalina M.D.   On: 08/10/2021 01:12   X-ray chest PA and lateral  Result Date: 08/09/2021 CLINICAL DATA:  Syncopal episode. EXAM: CHEST - 2 VIEW COMPARISON:  May 07, 2021 FINDINGS: The heart size and mediastinal contours are within normal limits. There is moderate severity calcification of the aortic arch. Both lungs are clear. The visualized skeletal structures are unremarkable.  IMPRESSION: No active cardiopulmonary disease. Electronically Signed   By: Virgina Norfolk M.D.   On: 08/09/2021 21:58   MR ANGIO HEAD WO CONTRAST  Result Date: 08/10/2021 CLINICAL DATA:  Stroke/TIA, determine embolic source; Carotid artery stenosis screening, risk factors. Syncope. EXAM: MRA NECK WITHOUT AND WITH CONTRAST MRA HEAD WITHOUT CONTRAST TECHNIQUE: Multiplanar and multiecho pulse sequences of the neck were obtained without and with intravenous contrast. Angiographic images of the neck were obtained using MRA technique without and with intravenous contrast; Angiographic images of the Circle of Willis were obtained using MRA technique without intravenous contrast. CONTRAST:  28m GADAVIST GADOBUTROL 1 MMOL/ML IV SOLN COMPARISON:  CT neck 10/11/2012. FINDINGS: MRA NECK FINDINGS The intracranial vertebral arteries are patent to the basilar. Patent bilateral PICA, right AICA, and bilateral SCA origins are visualized. A left AICA is not clearly identified. The basilar artery is widely patent. There are large posterior communicating arteries and hypoplastic P1 segments bilaterally. Both PCAs are patent without evidence of a significant proximal stenosis. The internal carotid arteries are widely patent from skull base to carotid termini. ACAs and MCAs are patent without evidence of a proximal branch occlusion or significant proximal stenosis. The right A1 segment is hypoplastic. No aneurysm is identified. MRA HEAD FINDINGS Standard 3 vessel aortic arch. Widely patent brachiocephalic and subclavian arteries. The cervical carotid arteries are patent with atherosclerotic plaque at the carotid bifurcations resulting in approximately 60% stenosis of the right ICA origin and approximally 45% stenosis of the left ICA origin. The vertebral arteries are patent and codominant with antegrade flow bilaterally. There may be a moderate stenosis of the right vertebral origin. No significant stenosis is evident on the left.  IMPRESSION: 1. Negative head MRA. 2. Cervical carotid atherosclerosis with 60% right and 45% left ICA origin stenoses. 3. Patent vertebral arteries with a possible moderate stenosis of the right vertebral origin. Electronically Signed   By: ALogan BoresM.D.   On: 08/10/2021  13:27   MR ANGIO NECK W WO CONTRAST  Result Date: 08/10/2021 CLINICAL DATA:  Stroke/TIA, determine embolic source; Carotid artery stenosis screening, risk factors. Syncope. EXAM: MRA NECK WITHOUT AND WITH CONTRAST MRA HEAD WITHOUT CONTRAST TECHNIQUE: Multiplanar and multiecho pulse sequences of the neck were obtained without and with intravenous contrast. Angiographic images of the neck were obtained using MRA technique without and with intravenous contrast; Angiographic images of the Circle of Willis were obtained using MRA technique without intravenous contrast. CONTRAST:  11m GADAVIST GADOBUTROL 1 MMOL/ML IV SOLN COMPARISON:  CT neck 10/11/2012. FINDINGS: MRA NECK FINDINGS The intracranial vertebral arteries are patent to the basilar. Patent bilateral PICA, right AICA, and bilateral SCA origins are visualized. A left AICA is not clearly identified. The basilar artery is widely patent. There are large posterior communicating arteries and hypoplastic P1 segments bilaterally. Both PCAs are patent without evidence of a significant proximal stenosis. The internal carotid arteries are widely patent from skull base to carotid termini. ACAs and MCAs are patent without evidence of a proximal branch occlusion or significant proximal stenosis. The right A1 segment is hypoplastic. No aneurysm is identified. MRA HEAD FINDINGS Standard 3 vessel aortic arch. Widely patent brachiocephalic and subclavian arteries. The cervical carotid arteries are patent with atherosclerotic plaque at the carotid bifurcations resulting in approximately 60% stenosis of the right ICA origin and approximally 45% stenosis of the left ICA origin. The vertebral arteries are  patent and codominant with antegrade flow bilaterally. There may be a moderate stenosis of the right vertebral origin. No significant stenosis is evident on the left. IMPRESSION: 1. Negative head MRA. 2. Cervical carotid atherosclerosis with 60% right and 45% left ICA origin stenoses. 3. Patent vertebral arteries with a possible moderate stenosis of the right vertebral origin. Electronically Signed   By: ALogan BoresM.D.   On: 08/10/2021 13:27   MR BRAIN WO CONTRAST  Result Date: 08/10/2021 CLINICAL DATA:  Initial evaluation for syncope/presyncope. Stroke suspected. EXAM: MRI HEAD WITHOUT CONTRAST TECHNIQUE: Multiplanar, multiecho pulse sequences of the brain and surrounding structures were obtained without intravenous contrast. COMPARISON:  Prior CT from 05/07/2021. FINDINGS: Brain: Cerebral volume within normal limits for age. Scattered patchy T2/FLAIR hyperintensity involving the periventricular deep white matter both cerebral hemispheres, most consistent with chronic small vessel ischemic disease, mild in nature. Patchy restricted diffusion involving the posterior left periventricular white matter at the left frontoparietal region consistent with an acute ischemic small vessel type infarct (series 5, images 25, 28). Infarct measures up to approximately 1.5 cm in length (series 7, image 13). No associated hemorrhage or significant regional mass effect. No other evidence for acute or subacute ischemia. Gray-white matter differentiation otherwise maintained. No acute intracranial hemorrhage. Few scattered chronic micro hemorrhages noted about the left greater than right periventricular white matter, likely related to chronic poorly controlled hypertension. No mass lesion, midline shift or mass effect. No hydrocephalus or extra-axial fluid collection. Prominent dural calcifications noted. Pituitary gland suprasellar region normal. Vascular: Major intracranial vascular flow voids are maintained. Skull and upper  cervical spine: Craniocervical junction within normal limits. Bone marrow signal intensity normal. Probable subcentimeter benign hemangioma noted within the C2 vertebral body. No scalp soft tissue abnormality. Sinuses/Orbits: Globes orbital soft tissues within normal limits. Scattered mucosal thickening about the ethmoidal air cells and maxillary sinuses. No mastoid effusion. Other: None. IMPRESSION: 1. 1.5 cm acute ischemic infarct involving the posterior left periventricular white matter at the left frontoparietal region. No associated hemorrhage or significant regional mass effect.  2. Underlying mild chronic microvascular ischemic disease. 3. Few scattered chronic micro hemorrhages about the left greater than right periventricular white matter, likely related to chronic poorly controlled hypertension. Electronically Signed   By: Jeannine Boga M.D.   On: 08/10/2021 03:27   DG Bone Survey Met  Result Date: 08/22/2021 CLINICAL DATA:  Anemia of chronic renal disease. EXAM: METASTATIC BONE SURVEY COMPARISON:  CT 08/10/2021 FINDINGS: No lytic or sclerotic lesion identified in the calvarium. No lytic or sclerotic lesion identified in the ribs or shoulder girdles. No lytic or sclerotic lesion identified in the spine or pelvis. No lytic or sclerotic lesion identified in the extremities. IMPRESSION: No aggressive osseous lesion identified. Electronically Signed   By: Suzy Bouchard M.D.   On: 08/22/2021 13:13   ECHOCARDIOGRAM COMPLETE  Result Date: 08/10/2021    ECHOCARDIOGRAM REPORT   Patient Name:   NISAIAH BECHTOL Date of Exam: 08/10/2021 Medical Rec #:  [id number]       Height:       70.0 in Accession #:    [id number]      Weight:       140.0 lb Date of Birth:  [birth year]      BSA:          1.794 m Patient Age:    4 years        BP:           143/84 mmHg Patient Gender: M               HR:           76 bpm. Exam Location:  Inpatient Procedure: 2D Echo, Cardiac Doppler, Color Doppler, 3D Echo and Strain  Analysis Indications:     Syncope R55  History:         Patient has no prior history of Echocardiogram examinations.                  Arrythmias:Heart block AV first degree; Risk                  Factors:Hypertension, Diabetes and Dyslipidemia. Acute kidney                  injury on chronic kidney disease.  Sonographer:     Darlina Sicilian RDCS Referring Phys:  [place] Diagnosing Phys: Ida Rogue MD IMPRESSIONS  1. Left ventricular ejection fraction, by estimation, is 60 to 65%. The left ventricle has normal function. The left ventricle has no regional wall motion abnormalities. There is mild concentric LVH with severe asymmetric left ventricular hypertrophy of  the basal-septal segment (1.6 cm). LVOT gradient was not measured. Left ventricular diastolic parameters are consistent with Grade II diastolic dysfunction (pseudonormalization). The average left ventricular global longitudinal strain is -14.9 %.  2. Right ventricular systolic function is normal. The right ventricular size is normal. There is normal pulmonary artery systolic pressure.  3. The mitral valve is normal in structure. Mild mitral valve regurgitation. No evidence of mitral stenosis.  4. The aortic valve is tricuspid. Aortic valve regurgitation is not visualized. Aortic valve sclerosis is present, with no evidence of aortic valve stenosis.  5. The inferior vena cava is normal in size with greater than 50% respiratory variability, suggesting right atrial pressure of 3 mmHg. FINDINGS  Left Ventricle: Left ventricular ejection fraction, by estimation, is 60 to 65%. The left ventricle has normal function. The left ventricle has no regional wall motion abnormalities. The average left ventricular  global longitudinal strain is -14.9 %. The left ventricular internal cavity size was normal in size. There is severe asymmetric left ventricular hypertrophy of the basal-septal segment. Left ventricular diastolic parameters are consistent with  Grade II diastolic dysfunction (pseudonormalization). Right Ventricle: The right ventricular size is normal. No increase in right ventricular wall thickness. Right ventricular systolic function is normal. There is normal pulmonary artery systolic pressure. The tricuspid regurgitant velocity is 1.51 m/s, and  with an assumed right atrial pressure of 3 mmHg, the estimated right ventricular systolic pressure is 72.5 mmHg. Left Atrium: Left atrial size was normal in size. Right Atrium: Right atrial size was normal in size. Pericardium: There is no evidence of pericardial effusion. Mitral Valve: The mitral valve is normal in structure. Mild mitral valve regurgitation. No evidence of mitral valve stenosis. Tricuspid Valve: The tricuspid valve is normal in structure. Tricuspid valve regurgitation is mild . No evidence of tricuspid stenosis. Aortic Valve: The aortic valve is tricuspid. Aortic valve regurgitation is not visualized. Aortic valve sclerosis is present, with no evidence of aortic valve stenosis. Pulmonic Valve: The pulmonic valve was normal in structure. Pulmonic valve regurgitation is not visualized. No evidence of pulmonic stenosis. Aorta: The aortic root is normal in size and structure. Venous: The inferior vena cava is normal in size with greater than 50% respiratory variability, suggesting right atrial pressure of 3 mmHg. IAS/Shunts: No atrial level shunt detected by color flow Doppler.  LEFT VENTRICLE PLAX 2D LVIDd:         3.90 cm LVIDs:         2.70 cm   2D Longitudinal Strain LV PW:         1.50 cm   2D Strain GLS Avg:     -14.9 % LV IVS:        1.60 cm LVOT diam:     2.40 cm LV SV:         63 LV SV Index:   35        3D Volume EF: LVOT Area:     4.52 cm  3D EF:        65 %                          LV EDV:       134 ml                          LV ESV:       47 ml                          LV SV:        87 ml RIGHT VENTRICLE RV S prime:     9.46 cm/s TAPSE (M-mode): 2.0 cm LEFT ATRIUM             Index         RIGHT ATRIUM           Index LA diam:        3.80 cm 2.12 cm/m   RA Area:     16.40 cm LA Vol (A2C):   54.5 ml 30.38 ml/m  RA Volume:   41.40 ml  23.08 ml/m LA Vol (A4C):   55.5 ml 30.94 ml/m LA Biplane Vol: 54.8 ml 30.55 ml/m  AORTIC VALVE LVOT Vmax:   66.60 cm/s LVOT Vmean:  48.900 cm/s LVOT VTI:  0.140 m  AORTA Ao Root diam: 3.40 cm Ao Asc diam:  3.45 cm TRICUSPID VALVE TR Peak grad:   9.1 mmHg TR Vmax:        151.00 cm/s  SHUNTS Systemic VTI:  0.14 m Systemic Diam: 2.40 cm Ida Rogue MD Electronically signed by Ida Rogue MD Signature Date/Time: 08/10/2021/12:35:57 PM    Final (Updated)      Assessment and plan- Patient is a 71 y.o. male who is here for follow-up of following issues  Smoldering multiple myeloma: Myeloma panel and serum free light chains are currently pending.  His last level was checked 6 months ago and patient did not show up for repeat labs again in 3 months later.  M protein based on last labs were stable around 2.5 g and serum free light chains ratio stable around 25.  I had an bone survey today which does not show any evidence of lytic lesions.  Suspect anemia secondary to chronic kidney disease.  Chronic kidney disease: Likely secondary to diabetes.  Continue to monitor  Normocytic anemia: Hemoglobin fluctuates around 10.  He is on Retacrit and will get his next dose today.  H&H and Retacrit every 3 weeks.  CBC CMP myeloma panel and serum free light chains in 3 months in 6 months.  I will see him back in 6 months   Visit Diagnosis 1. Anemia of chronic kidney failure, stage 3 (moderate) ([place])   2. Smoldering multiple myeloma   3. Erythropoietin (EPO) stimulating agent anemia management patient      Dr. Randa Evens, MD, MPH [facility] at [facility] [place] [phone] 08/22/2021 3:51 PM

## 2021-08-26 LAB — KAPPA/LAMBDA LIGHT CHAINS
Kappa free light chain: 12.9 mg/L (ref 3.3–19.4)
Kappa, lambda light chain ratio: 0.04 — ABNORMAL LOW (ref 0.26–1.65)
Lambda free light chains: 331.8 mg/L — ABNORMAL HIGH (ref 5.7–26.3)

## 2021-08-27 LAB — MULTIPLE MYELOMA PANEL, SERUM
Albumin SerPl Elph-Mcnc: 3.6 g/dL (ref 2.9–4.4)
Albumin/Glob SerPl: 0.7 (ref 0.7–1.7)
Alpha 1: 0.2 g/dL (ref 0.0–0.4)
Alpha2 Glob SerPl Elph-Mcnc: 0.7 g/dL (ref 0.4–1.0)
B-Globulin SerPl Elph-Mcnc: 0.7 g/dL (ref 0.7–1.3)
Gamma Glob SerPl Elph-Mcnc: 4.1 g/dL — ABNORMAL HIGH (ref 0.4–1.8)
Globulin, Total: 5.7 g/dL — ABNORMAL HIGH (ref 2.2–3.9)
IgA: 16 mg/dL — ABNORMAL LOW (ref 61–437)
IgG (Immunoglobin G), Serum: 4726 mg/dL — ABNORMAL HIGH (ref 603–1613)
IgM (Immunoglobulin M), Srm: <5 mg/dL — ABNORMAL LOW (ref 20–172)
M Protein SerPl Elph-Mcnc: 4 g/dL — ABNORMAL HIGH
Total Protein ELP: 9.3 g/dL — ABNORMAL HIGH (ref 6.0–8.5)

## 2021-09-05 ENCOUNTER — Encounter: Payer: Self-pay | Admitting: Oncology

## 2021-09-08 ENCOUNTER — Other Ambulatory Visit: Payer: Self-pay | Admitting: Family Medicine

## 2021-09-12 ENCOUNTER — Inpatient Hospital Stay: Payer: Medicare HMO | Attending: Oncology

## 2021-09-12 ENCOUNTER — Other Ambulatory Visit: Payer: Self-pay

## 2021-09-12 ENCOUNTER — Inpatient Hospital Stay: Payer: Medicare HMO

## 2021-09-12 DIAGNOSIS — D631 Anemia in chronic kidney disease: Secondary | ICD-10-CM

## 2021-09-12 DIAGNOSIS — C9000 Multiple myeloma not having achieved remission: Secondary | ICD-10-CM | POA: Insufficient documentation

## 2021-09-12 DIAGNOSIS — N1832 Chronic kidney disease, stage 3b: Secondary | ICD-10-CM | POA: Insufficient documentation

## 2021-09-12 LAB — CBC WITH DIFFERENTIAL/PLATELET
Abs Immature Granulocytes: 0 10*3/uL (ref 0.00–0.07)
Basophils Absolute: 0 10*3/uL (ref 0.0–0.1)
Basophils Relative: 0 %
Eosinophils Absolute: 0 10*3/uL (ref 0.0–0.5)
Eosinophils Relative: 1 %
HCT: 29.8 % — ABNORMAL LOW (ref 39.0–52.0)
Hemoglobin: 9.7 g/dL — ABNORMAL LOW (ref 13.0–17.0)
Immature Granulocytes: 0 %
Lymphocytes Relative: 32 %
Lymphs Abs: 0.9 10*3/uL (ref 0.7–4.0)
MCH: 32 pg (ref 26.0–34.0)
MCHC: 32.6 g/dL (ref 30.0–36.0)
MCV: 98.3 fL (ref 80.0–100.0)
Monocytes Absolute: 0.3 10*3/uL (ref 0.1–1.0)
Monocytes Relative: 11 %
Neutro Abs: 1.6 10*3/uL — ABNORMAL LOW (ref 1.7–7.7)
Neutrophils Relative %: 56 %
Platelets: 269 10*3/uL (ref 150–400)
RBC: 3.03 MIL/uL — ABNORMAL LOW (ref 4.22–5.81)
RDW: 15.3 % (ref 11.5–15.5)
WBC: 2.8 10*3/uL — ABNORMAL LOW (ref 4.0–10.5)
nRBC: 0 % (ref 0.0–0.2)

## 2021-09-12 LAB — COMPREHENSIVE METABOLIC PANEL
ALT: 22 U/L (ref 0–44)
AST: 21 U/L (ref 15–41)
Albumin: 3.3 g/dL — ABNORMAL LOW (ref 3.5–5.0)
Alkaline Phosphatase: 62 U/L (ref 38–126)
Anion gap: 5 (ref 5–15)
BUN: 24 mg/dL — ABNORMAL HIGH (ref 8–23)
CO2: 24 mmol/L (ref 22–32)
Calcium: 8.3 mg/dL — ABNORMAL LOW (ref 8.9–10.3)
Chloride: 102 mmol/L (ref 98–111)
Creatinine, Ser: 1.71 mg/dL — ABNORMAL HIGH (ref 0.61–1.24)
GFR, Estimated: 43 mL/min — ABNORMAL LOW (ref >60)
Glucose, Bld: 61 mg/dL — ABNORMAL LOW (ref 70–99)
Potassium: 4.2 mmol/L (ref 3.5–5.1)
Sodium: 131 mmol/L — ABNORMAL LOW (ref 135–145)
Total Bilirubin: 0.4 mg/dL (ref 0.3–1.2)
Total Protein: 9.8 g/dL — ABNORMAL HIGH (ref 6.5–8.1)

## 2021-09-15 ENCOUNTER — Inpatient Hospital Stay (HOSPITAL_BASED_OUTPATIENT_CLINIC_OR_DEPARTMENT_OTHER): Payer: Medicare HMO | Admitting: Oncology

## 2021-09-15 ENCOUNTER — Inpatient Hospital Stay: Payer: Medicare HMO

## 2021-09-15 ENCOUNTER — Telehealth: Payer: Self-pay

## 2021-09-15 ENCOUNTER — Encounter: Payer: Self-pay | Admitting: Oncology

## 2021-09-15 ENCOUNTER — Inpatient Hospital Stay: Payer: Medicare HMO | Admitting: Oncology

## 2021-09-15 VITALS — BP 146/83 | HR 87 | Temp 98.7°F | Resp 20 | Wt 159.6 lb

## 2021-09-15 DIAGNOSIS — D472 Monoclonal gammopathy: Secondary | ICD-10-CM

## 2021-09-15 DIAGNOSIS — C9000 Multiple myeloma not having achieved remission: Secondary | ICD-10-CM | POA: Diagnosis not present

## 2021-09-15 DIAGNOSIS — D631 Anemia in chronic kidney disease: Secondary | ICD-10-CM | POA: Diagnosis not present

## 2021-09-15 DIAGNOSIS — N1832 Chronic kidney disease, stage 3b: Secondary | ICD-10-CM | POA: Diagnosis not present

## 2021-09-15 DIAGNOSIS — N183 Chronic kidney disease, stage 3 unspecified: Secondary | ICD-10-CM

## 2021-09-15 LAB — KAPPA/LAMBDA LIGHT CHAINS
Kappa free light chain: 13.5 mg/L (ref 3.3–19.4)
Kappa, lambda light chain ratio: 0.04 — ABNORMAL LOW (ref 0.26–1.65)
Lambda free light chains: 316.9 mg/L — ABNORMAL HIGH (ref 5.7–26.3)

## 2021-09-15 MED ORDER — EPOETIN ALFA-EPBX 40000 UNIT/ML IJ SOLN
40000.0000 [IU] | Freq: Once | INTRAMUSCULAR | Status: AC
  Administered 2021-09-15: 40000 [IU] via SUBCUTANEOUS
  Filled 2021-09-15: qty 1

## 2021-09-15 NOTE — Addendum Note (Signed)
Addended by: Delice Bison E on: 09/15/2021 03:38 PM   Modules accepted: Orders

## 2021-09-15 NOTE — Telephone Encounter (Signed)
Bone marrow biopsy form faxed to specialty scheduling requested ASAP.

## 2021-09-15 NOTE — Progress Notes (Signed)
Hematology/Oncology Consult note [facility]  Telephone:([phone] [phone]  Patient Care Team: Dion Body, MD as PCP - General (Family Medicine) [place], [place] (Optometry) Sindy Guadeloupe, MD as Consulting Physician (Oncology)   Name of the patient: Nicholas Weiss  [id number]  05-Jun-1950   Date of visit: 09/15/21  Diagnosis- smoldering multiple myeloma versus overt myeloma not currently on myeloma treatment    Chief complaint/ Reason for visit-discussed results of blood work and further management  Heme/Onc history:  Patient is a 70 year old African-American male with a past medical history significant for uncontrolled type 1 diabetes, chronic kidney disease stage III chronic normocytic anemia referred for abnormal SPEP. Patient's creatinine has been fluctuating between 1.5-2 but about 5 months ago it went up all the way to 5 and his blood sugars were in the 1000 range. More recently his kidney numbers have been drifting back to normal values. As a part of the work-up he had serum protein electrophoresis done which showed an elevated gammaglobulin fraction with an M spike of 3%. The amount of M spike was not quantified in the specimen. He has therefore been referred to [place] for further management. Patient endorses chronic fatigue reports that his appetite and weight have remained stable   Results of blood work from 04/08/2020 were as follows: CBC showed white count of 2.9, H&H of 10.5/31.2 with an MCV of 91 and a platelet count of 191.  CMP showed a mildly elevated creatinine of 1.2 which was better as compared to 5 months ago when it was 3.9.  Total protein was mildly elevated at 9.1 calcium normal at 8.4 ferritin and iron studies B12 folate TSH and haptoglobin were normal.  Myeloma panel revealed an elevated IgG level of 06/04/2004 with an M protein of 3.1 g.  Immunofixation showed IgG lambda specificity.  Serum free light chain ratio was  elevated at 25 and free light chain lambda elevated at 370   Further myeloma work-up including a PET CT scan did not reveal any evidence of edematous lesions.  Bone marrow biopsy showed 23% plasma cells by manual count and 30% by CD138 IHC.  Normal cytogenetics.  FISH studies for myeloma showed gain of 1 q.  13 q-. detected.  P53 not detected.    Interval history-patient reports feeling well overall.  Denies any new complaints at this time.  ECOG PS- 1 Pain scale- 0   Review of systems- Review of Systems  Constitutional:  Positive for malaise/fatigue. Negative for chills, fever and weight loss.  HENT:  Negative for congestion, ear discharge and nosebleeds.   Eyes:  Negative for blurred vision.  Respiratory:  Negative for cough, hemoptysis, sputum production, shortness of breath and wheezing.   Cardiovascular:  Negative for chest pain, palpitations, orthopnea and claudication.  Gastrointestinal:  Negative for abdominal pain, blood in stool, constipation, diarrhea, heartburn, melena, nausea and vomiting.  Genitourinary:  Negative for dysuria, flank pain, frequency, hematuria and urgency.  Musculoskeletal:  Negative for back pain, joint pain and myalgias.  Skin:  Negative for rash.  Neurological:  Negative for dizziness, tingling, focal weakness, seizures, weakness and headaches.  Endo/Heme/Allergies:  Does not bruise/bleed easily.  Psychiatric/Behavioral:  Negative for depression and suicidal ideas. The patient does not have insomnia.       Allergies  Allergen Reactions   Penicillins Other (See Comments)    Has patient had a PCN reaction causing immediate rash, facial/tongue/throat swelling, SOB or lightheadedness with hypotension: No Has patient  had a PCN reaction causing severe rash involving mucus membranes or skin necrosis: No Has patient had a PCN reaction that required hospitalization No Has patient had a PCN reaction occurring within the last 10 years: No If all of the above  answers are "NO", then may proceed with Cephalosporin use.  Other reaction(s): Other (see comments) Other reaction(s): Other (See Comments) Has patient had a PCN reaction causing immediate rash, facial/tongue/throat swelling, SOB or lightheadedness with hypotension: No Has patient had a PCN reaction causing severe rash involving mucus membranes or skin necrosis: No Has patient had a PCN reaction that required hospitalization No Has patient had a PCN reaction occurring within the last 10 years: No If all of the above answers are "NO", then may proceed with Cephalosporin use. Has patient had a PCN reaction causing immediate rash, facial/tongue/throat swelling, SOB or lightheadedness with hypotension: No Has patient had a PCN reaction causing severe rash involving mucus membranes or skin necrosis: No Has patient had a PCN reaction that required hospitalization No Has patient had a PCN reaction occurring within the last 10 years: No If all of the above answers are "NO", then may proceed with Cephalosporin use.     Past Medical History:  Diagnosis Date   DKA (diabetic ketoacidoses) 04/06/2016   Hypercholesteremia    Hypertension      Past Surgical History:  Procedure Laterality Date   COLONOSCOPY WITH PROPOFOL N/A 02/01/2020   Procedure: COLONOSCOPY WITH PROPOFOL;  Surgeon: Lin Landsman, MD;  Location: [facility] ENDOSCOPY;  Service: Gastroenterology;  Laterality: N/A;   ESOPHAGOGASTRODUODENOSCOPY  02/01/2020   Procedure: ESOPHAGOGASTRODUODENOSCOPY (EGD);  Surgeon: Lin Landsman, MD;  Location: [facility] ENDOSCOPY;  Service: Gastroenterology;;   KNEE SURGERY Right    Torn meniscus   KNEE SURGERY Left     Social History   Socioeconomic History   Marital status: Single    Spouse name: Not on file   Number of children: 3   Years of education: Not on file   Highest education level: High school graduate  Occupational History    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  Tobacco Use   Smoking  status: Never   Smokeless tobacco: Never  Vaping Use   Vaping Use: Never used  Substance and Sexual Activity   Alcohol use: Yes    Alcohol/week: 0.0 - 1.0 standard drinks of alcohol    Comment: "once every 2 months"   Drug use: Not Currently    Types: Marijuana    Comment: "none in a long time'   Sexual activity: Yes    Birth control/protection: None  Other Topics Concern   Not on file  Social History Narrative   Not on file   Social Determinants of Health   Financial Resource Strain: Low Risk  (01/01/2020)   Overall Financial Resource Strain (CARDIA)    Difficulty of Paying Living Expenses: Not hard at all  Food Insecurity: No Food Insecurity (01/01/2020)   Hunger Vital Sign    Worried About Running Out of Food in the Last Year: Never true    Ran Out of Food in the Last Year: Never true  Transportation Needs: No Transportation Needs (01/01/2020)   PRAPARE - Hydrologist (Medical): No    Lack of Transportation (Non-Medical): No  Physical Activity: Insufficiently Active (01/01/2020)   Exercise Vital Sign    Days of Exercise per Week: 7 days    Minutes of Exercise per Session: 20 min  Stress: No Stress Concern  Present (01/01/2020)   [place]    Feeling of Stress : Not at all  Social Connections: Moderately Isolated (01/01/2020)   Social Connection and Isolation Panel [NHANES]    Frequency of Communication with Friends and Family: More than three times a week    Frequency of Social Gatherings with Friends and Family: More than three times a week    Attends Religious Services: Never    Marine scientist or Organizations: No    Attends Archivist Meetings: Never    Marital Status: Living with partner  Intimate Partner Violence: Not At Risk (01/01/2020)   Humiliation, Afraid, Rape, and Kick questionnaire    Fear of Current or Ex-Partner: No    Emotionally Abused: No     Physically Abused: No    Sexually Abused: No    Family History  Problem Relation Age of Onset   Heart attack Father    Hypertension Sister    Cancer Sister      Current Outpatient Medications:    Alcohol Swabs (B-D SINGLE USE SWABS REGULAR) PADS, Use to check blood sugar four times daily for type 1 diabetes, Disp: 300 each, Rfl: 4   aspirin 81 MG chewable tablet, Chew 1 tablet (81 mg total) by mouth daily., Disp: 30 tablet, Rfl: 11   atorvastatin (LIPITOR) 80 MG tablet, Take 1 tablet (80 mg total) by mouth at bedtime., Disp: 30 tablet, Rfl: 2   Blood Glucose Calibration (TRUE METRIX LEVEL 1) Low SOLN, Use to check blood sugar four times daily for type 1 diabetes, Disp: 1 each, Rfl: 2   clopidogrel (PLAVIX) 75 MG tablet, Take 1 tablet (75 mg total) by mouth daily., Disp: 20 tablet, Rfl: 0   FARXIGA 10 MG TABS tablet, Take 10 mg by mouth daily., Disp: , Rfl:    gabapentin (NEURONTIN) 300 MG capsule, Take 300 mg by mouth 2 (two) times daily., Disp: , Rfl:    insulin degludec (TRESIBA FLEXTOUCH) 100 UNIT/ML FlexTouch Pen, 15 units daily. (Patient taking differently: Inject 20 Units into the skin daily. 20 units daily.), Disp: 30 mL, Rfl: 3   Insulin Syringe-Needle U-100 (INSULIN SYRINGE 1CC/31GX5/16") 31G X 5/16" 1 ML MISC, For insulin injections up to 4 times daily, Disp: 270 each, Rfl: 4   lisinopril (ZESTRIL) 2.5 MG tablet, Take 2.5 mg by mouth daily., Disp: , Rfl:    NOVOLOG 100 UNIT/ML injection, INJECT UP TO 14 UNITS THREE TIMES DAILY BEFORE MEALS ACCORDING TO SLIDING SCALE, Disp: 10 mL, Rfl: 1   ondansetron (ZOFRAN) 4 MG tablet, Take 1 tablet (4 mg total) by mouth every 8 (eight) hours as needed for nausea or vomiting. (Patient not taking: Reported on 08/22/2021), Disp: 20 tablet, Rfl: 0  Physical exam:  Vitals:   09/15/21 1309  BP: (!) 146/83  Pulse: 87  Resp: 20  Temp: 98.7 F (37.1 C)  SpO2: (!) 10%  Weight: 159 lb 9.6 oz (72.4 kg)   Physical Exam Constitutional:       General: He is not in acute distress. Cardiovascular:     Rate and Rhythm: Normal rate and regular rhythm.     Heart sounds: Normal heart sounds.  Pulmonary:     Effort: Pulmonary effort is normal.     Breath sounds: Normal breath sounds.  Abdominal:     General: Bowel sounds are normal.     Palpations: Abdomen is soft.  Skin:    General: Skin is warm  and dry.  Neurological:     Mental Status: He is alert and oriented to person, place, and time.         Latest Ref Rng & Units 09/12/2021   11:27 AM  CMP  Glucose 70 - 99 mg/dL 61   BUN 8 - 23 mg/dL 24   Creatinine 0.61 - 1.24 mg/dL 1.71   Sodium 135 - 145 mmol/L 131   Potassium 3.5 - 5.1 mmol/L 4.2   Chloride 98 - 111 mmol/L 102   CO2 22 - 32 mmol/L 24   Calcium 8.9 - 10.3 mg/dL 8.3   Total Protein 6.5 - 8.1 g/dL 9.8   Total Bilirubin 0.3 - 1.2 mg/dL 0.4   Alkaline Phos 38 - 126 U/L 62   AST 15 - 41 U/L 21   ALT 0 - 44 U/L 22       Latest Ref Rng & Units 09/12/2021   11:27 AM  CBC  WBC 4.0 - 10.5 K/uL 2.8   Hemoglobin 13.0 - 17.0 g/dL 9.7   Hematocrit 39.0 - 52.0 % 29.8   Platelets 150 - 400 K/uL 269     No images are attached to the encounter.  DG Bone Survey Met  Result Date: 08/22/2021 CLINICAL DATA:  Anemia of chronic renal disease. EXAM: METASTATIC BONE SURVEY COMPARISON:  CT 08/10/2021 FINDINGS: No lytic or sclerotic lesion identified in the calvarium. No lytic or sclerotic lesion identified in the ribs or shoulder girdles. No lytic or sclerotic lesion identified in the spine or pelvis. No lytic or sclerotic lesion identified in the extremities. IMPRESSION: No aggressive osseous lesion identified. Electronically Signed   By: Suzy Bouchard M.D.   On: 08/22/2021 13:13     Assessment and plan- Patient is a 71 y.o. male with IgG lambda smoldering multiple myeloma here to discuss further management  Patient's hemoglobin has been stable between 9-10 for the last 1 year.  He also has baseline leukopenia/neutropenia  with an [place] that is typically between 1-1.5.  Platelet counts are normal.  He has baseline CKD with a creatinine that fluctuates between 1.5-2.2 which could be secondary to his hypertension or diabetes but he has never had a formal kidney biopsy either.  Presently his myeloma panel shows further increase in the IgG M protein to 4 g with a total IgG of 4000 726.  Serum free light chain ratio has been around 25.  Serum free lambda light chain goes up and down.  It was 370 a year ago then went down to 197 and now back up to 331.  Given that his M protein is significantly elevated at 4 g as compared to a prior value of 3 g I would like to repeat his bone marrow biopsy at this time as well as a PET scan.  I am having a low threshold to call this multiple myeloma and proceed with treatment but I will wait about studies before deciding about further options  Anemia possibly secondary to chronic kidney disease: He has been on Retacrit every 3 weeks and will receive his next dose today and I will see him back in 3 weeks for his next dose of Retacrit and talk about bone marrow and PET CT scan results   Visit Diagnosis 1. Smoldering multiple myeloma      Dr. Randa Evens, MD, MPH [facility] at [facility] [phone] 09/15/2021 1:30 PM

## 2021-09-16 ENCOUNTER — Telehealth: Payer: Self-pay | Admitting: *Deleted

## 2021-09-16 NOTE — Telephone Encounter (Signed)
Called the home and spoke to family member and told her that pt has BM bx on 6/27. He will need to arrive at medical mall at [facility] 7:30. He will go to admission desk and will get papers and bracelet. They will take him to get the BM bx in IR. He must have a driver and nothing to eat or drink for 8 hours before the procedure. Also a staff member from IR usually calls 1-2 days prior to go over the appts and what meds he may need or not. Also I told her that he has pet scan the next day 6/28.

## 2021-09-18 ENCOUNTER — Telehealth: Payer: Self-pay | Admitting: Pharmacist

## 2021-09-18 ENCOUNTER — Encounter: Payer: Self-pay | Admitting: Oncology

## 2021-09-18 ENCOUNTER — Other Ambulatory Visit (HOSPITAL_COMMUNITY): Payer: Self-pay

## 2021-09-18 LAB — MULTIPLE MYELOMA PANEL, SERUM
Albumin SerPl Elph-Mcnc: 3.6 g/dL (ref 2.9–4.4)
Albumin/Glob SerPl: 0.7 (ref 0.7–1.7)
Alpha 1: 0.3 g/dL (ref 0.0–0.4)
Alpha2 Glob SerPl Elph-Mcnc: 0.7 g/dL (ref 0.4–1.0)
B-Globulin SerPl Elph-Mcnc: 0.7 g/dL (ref 0.7–1.3)
Gamma Glob SerPl Elph-Mcnc: 4.2 g/dL — ABNORMAL HIGH (ref 0.4–1.8)
Globulin, Total: 5.8 g/dL — ABNORMAL HIGH (ref 2.2–3.9)
IgA: 16 mg/dL — ABNORMAL LOW (ref 61–437)
IgG (Immunoglobin G), Serum: 5463 mg/dL — ABNORMAL HIGH (ref 603–1613)
IgM (Immunoglobulin M), Srm: <5 mg/dL — ABNORMAL LOW (ref 20–172)
M Protein SerPl Elph-Mcnc: 4.1 g/dL — ABNORMAL HIGH
Total Protein ELP: 9.4 g/dL — ABNORMAL HIGH (ref 6.0–8.5)

## 2021-09-18 NOTE — Telephone Encounter (Signed)
Oral Oncology Pharmacist Encounter  Received new prescription for Revlimid (lenalidomide) for the treatment of newly diagnosed IgG lambda multiple myeloma (previously smoldering myeloma) in conjunction with daratumumab, bortezomib, and dexamethasone, planned duration until disease control or unacceptable drug toxicity.  CMP from 09/12/21 assessed, SCr elevated ay 1.71, CrCl ~43 mL/min. Prescription dose and frequency assessed. Due to renal impairment, plan on starting patient on reduced lenalidomide dose  Current medication list in Epic reviewed, no DDIs with lenalidomide identified.   Evaluated chart and no patient barriers to medication adherence identified.   Oral Oncology Clinic will continue to follow for insurance authorization, copayment issues, initial counseling and start date.   Remi Haggard, PharmD, BCPS, BCOP, CPP Hematology/Oncology Clinical Pharmacist Practitioner [place]/[facility]/AP Oral Chemotherapy Navigation Clinic [phone]  09/18/2021 4:09 PM

## 2021-09-19 ENCOUNTER — Telehealth: Payer: Self-pay | Admitting: Pharmacy Technician

## 2021-09-19 NOTE — Progress Notes (Signed)
Attempted to reach patient at all provided numbers in chart to go over pre-procedure instructions 09/19/21 @ 12:55 pm. Listed cell number has VM that has not been set up. Number listed on DPR form as ok to leave detailed VM on is disconnected. Patient was not at work when attempting to reach him at work number. Left VM at listed home number with instructions to please call back. Will attempt to reach patient again Monday 6/26 if call is not returned before the weekend.

## 2021-09-22 ENCOUNTER — Other Ambulatory Visit (HOSPITAL_COMMUNITY): Payer: Self-pay

## 2021-09-22 ENCOUNTER — Other Ambulatory Visit: Payer: Self-pay | Admitting: Radiology

## 2021-09-22 DIAGNOSIS — C9000 Multiple myeloma not having achieved remission: Secondary | ICD-10-CM

## 2021-09-22 NOTE — Progress Notes (Signed)
Spoke with patient 09/22/21 @ 11:15 am. Nicholas Weiss over pre-procedure instructions to include the need to arrive at 7:30 for 8:30 procedure, need to be NPO after midnight, need to hold blood thinners (Aspirin-81mg  and Plavix) morning of procedure, need to take 1/2 dose of insulin tonight and hold insulin morning of procedure and need for driver post-procedure. Patient verbalized understanding.

## 2021-09-23 ENCOUNTER — Other Ambulatory Visit: Payer: Self-pay

## 2021-09-23 ENCOUNTER — Ambulatory Visit
Admission: RE | Admit: 2021-09-23 | Discharge: 2021-09-23 | Disposition: A | Discharge status: Home / Self Care | Payer: Medicare HMO | Source: Ambulatory Visit | Attending: Oncology | Admitting: Oncology

## 2021-09-23 ENCOUNTER — Encounter: Payer: Self-pay | Admitting: Oncology

## 2021-09-23 DIAGNOSIS — I129 Hypertensive chronic kidney disease with stage 1 through stage 4 chronic kidney disease, or unspecified chronic kidney disease: Secondary | ICD-10-CM | POA: Diagnosis not present

## 2021-09-23 DIAGNOSIS — Z7984 Long term (current) use of oral hypoglycemic drugs: Secondary | ICD-10-CM | POA: Insufficient documentation

## 2021-09-23 DIAGNOSIS — N189 Chronic kidney disease, unspecified: Secondary | ICD-10-CM | POA: Insufficient documentation

## 2021-09-23 DIAGNOSIS — Z794 Long term (current) use of insulin: Secondary | ICD-10-CM | POA: Diagnosis not present

## 2021-09-23 DIAGNOSIS — Q998 Other specified chromosome abnormalities: Secondary | ICD-10-CM | POA: Insufficient documentation

## 2021-09-23 DIAGNOSIS — C9000 Multiple myeloma not having achieved remission: Secondary | ICD-10-CM | POA: Diagnosis not present

## 2021-09-23 DIAGNOSIS — Z8249 Family history of ischemic heart disease and other diseases of the circulatory system: Secondary | ICD-10-CM | POA: Insufficient documentation

## 2021-09-23 DIAGNOSIS — D708 Other neutropenia: Secondary | ICD-10-CM | POA: Diagnosis not present

## 2021-09-23 DIAGNOSIS — D649 Anemia, unspecified: Secondary | ICD-10-CM | POA: Insufficient documentation

## 2021-09-23 DIAGNOSIS — E1122 Type 2 diabetes mellitus with diabetic chronic kidney disease: Secondary | ICD-10-CM | POA: Insufficient documentation

## 2021-09-23 DIAGNOSIS — D472 Monoclonal gammopathy: Secondary | ICD-10-CM | POA: Diagnosis not present

## 2021-09-23 LAB — CBC WITH DIFFERENTIAL/PLATELET
Abs Immature Granulocytes: 0.01 10*3/uL (ref 0.00–0.07)
Basophils Absolute: 0 10*3/uL (ref 0.0–0.1)
Basophils Relative: 0 %
Eosinophils Absolute: 0.1 10*3/uL (ref 0.0–0.5)
Eosinophils Relative: 2 %
HCT: 31.8 % — ABNORMAL LOW (ref 39.0–52.0)
Hemoglobin: 10.1 g/dL — ABNORMAL LOW (ref 13.0–17.0)
Immature Granulocytes: 0 %
Lymphocytes Relative: 40 %
Lymphs Abs: 1.1 10*3/uL (ref 0.7–4.0)
MCH: 31.7 pg (ref 26.0–34.0)
MCHC: 31.8 g/dL (ref 30.0–36.0)
MCV: 99.7 fL (ref 80.0–100.0)
Monocytes Absolute: 0.3 10*3/uL (ref 0.1–1.0)
Monocytes Relative: 12 %
Neutro Abs: 1.2 10*3/uL — ABNORMAL LOW (ref 1.7–7.7)
Neutrophils Relative %: 46 %
Platelets: 264 10*3/uL (ref 150–400)
RBC: 3.19 MIL/uL — ABNORMAL LOW (ref 4.22–5.81)
RDW: 15.2 % (ref 11.5–15.5)
WBC: 2.7 10*3/uL — ABNORMAL LOW (ref 4.0–10.5)
nRBC: 0 % (ref 0.0–0.2)

## 2021-09-23 LAB — GLUCOSE, CAPILLARY: Glucose-Capillary: 87 mg/dL (ref 70–99)

## 2021-09-23 MED ORDER — MIDAZOLAM HCL 2 MG/2ML IJ SOLN
INTRAMUSCULAR | Status: AC
  Filled 2021-09-23: qty 2

## 2021-09-23 MED ORDER — FENTANYL CITRATE (PF) 100 MCG/2ML IJ SOLN
INTRAMUSCULAR | Status: AC
  Filled 2021-09-23: qty 2

## 2021-09-23 MED ORDER — FENTANYL CITRATE (PF) 100 MCG/2ML IJ SOLN
INTRAMUSCULAR | Status: AC | PRN
  Administered 2021-09-23 (×2): 50 ug via INTRAVENOUS

## 2021-09-23 MED ORDER — HEPARIN SOD (PORK) LOCK FLUSH 100 UNIT/ML IV SOLN
INTRAVENOUS | Status: AC
  Filled 2021-09-23: qty 5

## 2021-09-23 MED ORDER — SODIUM CHLORIDE 0.9 % IV SOLN: INTRAVENOUS | Status: DC

## 2021-09-23 MED ORDER — MIDAZOLAM HCL 2 MG/2ML IJ SOLN
INTRAMUSCULAR | Status: AC | PRN
  Administered 2021-09-23: 1 mg via INTRAVENOUS

## 2021-09-23 NOTE — H&P (Signed)
Chief Complaint: Patient was seen in consultation today for Dr. Smith Robert at the request of Rao,Archana C  Referring Physician(s): Creig Hines  Supervising Physician: Irish Lack  Patient Status: [facility] - Out-pt  History of Present Illness: Nicholas Weiss is a 71 y.o. male with PMHx significant for DM, CKD, chronic anemia with abnormal SPEP, smoldering IgG lambda multiple myeloma not currently on treatment however recent increase in IgG M protein, patient follows with Dr. Smith Robert and was last seen on 6/19 with request received for bone marrow biopsy.   The patient denies any current chest pain or shortness of breath. The patient denies any history of sleep apnea or chronic oxygen use. He has no known complications to sedation.    Past Medical History:  Diagnosis Date   DKA (diabetic ketoacidoses) 04/06/2016   Hypercholesteremia    Hypertension     Past Surgical History:  Procedure Laterality Date   COLONOSCOPY WITH PROPOFOL N/A 02/01/2020   Procedure: COLONOSCOPY WITH PROPOFOL;  Surgeon: Toney Reil, MD;  Location: [facility] ENDOSCOPY;  Service: Gastroenterology;  Laterality: N/A;   ESOPHAGOGASTRODUODENOSCOPY  02/01/2020   Procedure: ESOPHAGOGASTRODUODENOSCOPY (EGD);  Surgeon: Toney Reil, MD;  Location: [facility] ENDOSCOPY;  Service: Gastroenterology;;   KNEE SURGERY Right    Torn meniscus   KNEE SURGERY Left     Allergies: Penicillins  Medications: Prior to Admission medications   Medication Sig Start Date End Date Taking? Authorizing Provider  atorvastatin (LIPITOR) 80 MG tablet Take 1 tablet (80 mg total) by mouth at bedtime. 08/11/21  Yes Cipriano Bunker, MD  clopidogrel (PLAVIX) 75 MG tablet Take 1 tablet (75 mg total) by mouth daily. 08/12/21  Yes Cipriano Bunker, MD  FARXIGA 10 MG TABS tablet Take 10 mg by mouth daily. 05/05/21  Yes [provider]  gabapentin (NEURONTIN) 300 MG capsule Take 300 mg by mouth 2 (two) times daily. 04/04/21  Yes [provider]  insulin degludec (TRESIBA FLEXTOUCH) 100 UNIT/ML FlexTouch Pen 15 units daily. Patient taking differently: Inject 20 Units into the skin daily. 20 units daily. 12/04/20  Yes Glade Lloyd, MD  lisinopril (ZESTRIL) 2.5 MG tablet Take 2.5 mg by mouth daily. 11/08/20 11/08/21 Yes [provider]  NOVOLOG 100 UNIT/ML injection INJECT UP TO 14 UNITS THREE TIMES DAILY BEFORE MEALS ACCORDING TO SLIDING SCALE 09/09/21  Yes Malva Limes, MD  Alcohol Swabs (B-D SINGLE USE SWABS REGULAR) PADS Use to check blood sugar four times daily for type 1 diabetes 07/11/20   Malva Limes, MD  aspirin 81 MG chewable tablet Chew 1 tablet (81 mg total) by mouth daily. Patient not taking: Reported on 09/23/2021 08/12/21   Cipriano Bunker, MD  Blood Glucose Calibration (TRUE METRIX LEVEL 1) Low SOLN Use to check blood sugar four times daily for type 1 diabetes 07/11/20   Malva Limes, MD  Insulin Syringe-Needle U-100 (INSULIN SYRINGE 1CC/31GX5/16") 31G X 5/16" 1 ML MISC For insulin injections up to 4 times daily 07/11/20   Malva Limes, MD  ondansetron (ZOFRAN) 4 MG tablet Take 1 tablet (4 mg total) by mouth every 8 (eight) hours as needed for nausea or vomiting. Patient not taking: Reported on 08/22/2021 05/07/21   Willy Eddy, MD     Family History  Problem Relation Age of Onset   Heart attack Father    Hypertension Sister    Cancer Sister     Social History   Socioeconomic History   Marital status: Single    Spouse name:  Not on file   Number of children: 3   Years of education: Not on file   Highest education level: High school graduate  Occupational History    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  Tobacco Use   Smoking status: Never   Smokeless tobacco: Never  Vaping Use   Vaping Use: Never used  Substance and Sexual Activity   Alcohol use: Not Currently    Alcohol/week: 0.0 - 1.0 standard drinks of alcohol    Comment: "once every 2 months"   Drug use: Not Currently     Types: Marijuana    Comment: last week   Sexual activity: Yes    Birth control/protection: None  Other Topics Concern   Not on file  Social History Narrative   Lives with girlfriend "sharon"   Social Determinants of Health   Financial Resource Strain: Low Risk  (01/01/2020)   Overall Financial Resource Strain (CARDIA)    Difficulty of Paying Living Expenses: Not hard at all  Food Insecurity: No Food Insecurity (01/01/2020)   Hunger Vital Sign    Worried About Running Out of Food in the Last Year: Never true    Ran Out of Food in the Last Year: Never true  Transportation Needs: No Transportation Needs (01/01/2020)   PRAPARE - Administrator, Civil Service (Medical): No    Lack of Transportation (Non-Medical): No  Physical Activity: Insufficiently Active (01/01/2020)   Exercise Vital Sign    Days of Exercise per Week: 7 days    Minutes of Exercise per Session: 20 min  Stress: No Stress Concern Present (01/01/2020)   [employer] of Occupational Health - Occupational Stress Questionnaire    Feeling of Stress : Not at all  Social Connections: Moderately Isolated (01/01/2020)   Social Connection and Isolation Panel [NHANES]    Frequency of Communication with Friends and Family: More than three times a week    Frequency of Social Gatherings with Friends and Family: More than three times a week    Attends Religious Services: Never    Database administrator or Organizations: No    Attends Banker Meetings: Never    Marital Status: Living with partner    Review of Systems: A 12 point ROS discussed and pertinent positives are indicated in the HPI above.  All other systems are negative.  Review of Systems  Vital Signs: BP (!) 167/93   Temp 97.8 F (36.6 C) (Oral)   Resp 20   Ht 5' 10.5" (1.791 m)   Wt 168 lb (76.2 kg)   SpO2 99%   BMI 23.76 kg/m    Physical Exam Constitutional:      Appearance: Normal appearance.  HENT:     Head: Normocephalic  and atraumatic.  Cardiovascular:     Rate and Rhythm: Normal rate and regular rhythm.  Pulmonary:     Effort: Pulmonary effort is normal. No respiratory distress.  Neurological:     Mental Status: He is alert and oriented to person, place, and time.     Imaging: No results found.  Labs:  CBC: Recent Labs    08/10/21 0500 08/22/21 1014 09/12/21 1127 09/23/21 0724  WBC 2.9* 4.7 2.8* 2.7*  HGB 8.6* 9.1* 9.7* 10.1*  HCT 26.6* 28.2* 29.8* 31.8*  PLT 168 209 269 264    COAGS: No results for input(s): "INR", "APTT" in the last 8760 hours.  BMP: Recent Labs    08/10/21 0500 08/11/21 0856 08/22/21 1014 09/12/21 1127  NA 134* 135 135 131*  K 4.7 4.3 4.0 4.2  CL 108 106 105 102  CO2 24 24 24 24   GLUCOSE 212* 112* 96 61*  BUN 31* 25* 23 24*  CALCIUM 7.7* 8.4* 8.1* 8.3*  CREATININE 1.67* 1.39* 1.52* 1.71*  GFRNONAA 44* 55* 50* 43*    LIVER FUNCTION TESTS: Recent Labs    08/09/21 1754 08/10/21 0500 08/22/21 1014 09/12/21 1127  BILITOT 0.5 1.0 0.3 0.4  AST 15 16 24 21   ALT 13 11 24 22   ALKPHOS 41 41 62 62  PROT 8.6* 8.0 9.0* 9.8*  ALBUMIN 3.0* 2.6* 3.0* 3.3*    Assessment and Plan: This is a 71 year old male with PMHx significant for DM, CKD, chronic anemia with abnormal SPEP, smoldering IgG lambda multiple myeloma not currently on treatment however recent increase in IgG M protein, patient follows with Dr. Smith Robert and was last seen on 6/19 with request received for bone marrow biopsy.   The patient has been NPO, labs and vitals have been reviewed.  Risks and benefits of image guided bone marrow biopsy with moderate sedation was discussed with the patient and/or patient's family including, but not limited to bleeding, infection, damage to adjacent structures or low yield requiring additional tests.  All of the questions were answered and there is agreement to proceed.  Consent signed and in chart.   Thank you for this interesting consult.  I greatly enjoyed  meeting GARBRIEL HEBER and look forward to participating in their care.  A copy of this report was sent to the requesting provider on this date.  Electronically Signed: Berneta Levins, PA-C 09/23/2021, 8:21 AM   I spent a total of 15 Minutes in face to face in clinical consultation, greater than 50% of which was counseling/coordinating care for multiple myeloma.

## 2021-09-24 ENCOUNTER — Ambulatory Visit
Admission: RE | Admit: 2021-09-24 | Discharge: 2021-09-24 | Disposition: A | Discharge status: Home / Self Care | Payer: Medicare HMO | Source: Ambulatory Visit | Attending: Oncology | Admitting: Oncology

## 2021-09-24 DIAGNOSIS — D472 Monoclonal gammopathy: Secondary | ICD-10-CM

## 2021-09-25 ENCOUNTER — Other Ambulatory Visit (HOSPITAL_COMMUNITY): Payer: Self-pay

## 2021-09-25 DIAGNOSIS — E1065 Type 1 diabetes mellitus with hyperglycemia: Secondary | ICD-10-CM | POA: Diagnosis not present

## 2021-09-25 DIAGNOSIS — R1084 Generalized abdominal pain: Secondary | ICD-10-CM | POA: Diagnosis not present

## 2021-09-25 DIAGNOSIS — I517 Cardiomegaly: Secondary | ICD-10-CM | POA: Diagnosis not present

## 2021-09-25 DIAGNOSIS — R000 Tachycardia, unspecified: Secondary | ICD-10-CM | POA: Diagnosis not present

## 2021-09-25 DIAGNOSIS — R9431 Abnormal electrocardiogram [ECG] [EKG]: Secondary | ICD-10-CM | POA: Diagnosis not present

## 2021-09-25 DIAGNOSIS — I440 Atrioventricular block, first degree: Secondary | ICD-10-CM | POA: Diagnosis not present

## 2021-09-25 DIAGNOSIS — I4891 Unspecified atrial fibrillation: Secondary | ICD-10-CM | POA: Diagnosis not present

## 2021-09-25 DIAGNOSIS — C9000 Multiple myeloma not having achieved remission: Secondary | ICD-10-CM | POA: Diagnosis not present

## 2021-09-25 DIAGNOSIS — I491 Atrial premature depolarization: Secondary | ICD-10-CM | POA: Diagnosis not present

## 2021-09-25 LAB — SURGICAL PATHOLOGY

## 2021-09-26 ENCOUNTER — Inpatient Hospital Stay: Payer: Medicare HMO | Admitting: Pharmacist

## 2021-09-26 ENCOUNTER — Inpatient Hospital Stay: Payer: Medicare HMO | Admitting: Oncology

## 2021-09-26 ENCOUNTER — Inpatient Hospital Stay: Payer: Medicare HMO

## 2021-09-26 DIAGNOSIS — E860 Dehydration: Secondary | ICD-10-CM | POA: Diagnosis not present

## 2021-09-26 DIAGNOSIS — E1065 Type 1 diabetes mellitus with hyperglycemia: Secondary | ICD-10-CM | POA: Diagnosis not present

## 2021-09-26 DIAGNOSIS — R2689 Other abnormalities of gait and mobility: Secondary | ICD-10-CM | POA: Diagnosis not present

## 2021-09-26 DIAGNOSIS — E162 Hypoglycemia, unspecified: Secondary | ICD-10-CM | POA: Diagnosis not present

## 2021-09-26 DIAGNOSIS — R1084 Generalized abdominal pain: Secondary | ICD-10-CM | POA: Diagnosis not present

## 2021-09-26 DIAGNOSIS — R404 Transient alteration of awareness: Secondary | ICD-10-CM | POA: Diagnosis not present

## 2021-09-26 DIAGNOSIS — E161 Other hypoglycemia: Secondary | ICD-10-CM | POA: Diagnosis not present

## 2021-09-26 DIAGNOSIS — Z743 Need for continuous supervision: Secondary | ICD-10-CM | POA: Diagnosis not present

## 2021-09-29 ENCOUNTER — Encounter: Payer: Self-pay | Admitting: Oncology

## 2021-09-29 ENCOUNTER — Inpatient Hospital Stay (HOSPITAL_BASED_OUTPATIENT_CLINIC_OR_DEPARTMENT_OTHER): Payer: Medicare Other | Admitting: Pharmacist

## 2021-09-29 ENCOUNTER — Telehealth: Payer: Self-pay

## 2021-09-29 ENCOUNTER — Inpatient Hospital Stay: Payer: Medicare Other | Attending: Oncology

## 2021-09-29 ENCOUNTER — Inpatient Hospital Stay: Payer: Medicare Other

## 2021-09-29 ENCOUNTER — Inpatient Hospital Stay (HOSPITAL_BASED_OUTPATIENT_CLINIC_OR_DEPARTMENT_OTHER): Payer: Medicare Other | Admitting: Oncology

## 2021-09-29 VITALS — BP 143/90 | HR 77 | Temp 97.6°F | Resp 16 | Ht 70.5 in | Wt 161.7 lb

## 2021-09-29 DIAGNOSIS — N183 Chronic kidney disease, stage 3 unspecified: Secondary | ICD-10-CM

## 2021-09-29 DIAGNOSIS — N1832 Chronic kidney disease, stage 3b: Secondary | ICD-10-CM | POA: Diagnosis present

## 2021-09-29 DIAGNOSIS — D649 Anemia, unspecified: Secondary | ICD-10-CM

## 2021-09-29 DIAGNOSIS — Z5112 Encounter for antineoplastic immunotherapy: Secondary | ICD-10-CM | POA: Diagnosis not present

## 2021-09-29 DIAGNOSIS — R253 Fasciculation: Secondary | ICD-10-CM

## 2021-09-29 DIAGNOSIS — Z7961 Long term (current) use of immunomodulator: Secondary | ICD-10-CM | POA: Insufficient documentation

## 2021-09-29 DIAGNOSIS — Z794 Long term (current) use of insulin: Secondary | ICD-10-CM | POA: Insufficient documentation

## 2021-09-29 DIAGNOSIS — Z7969 Long term (current) use of other immunomodulators and immunosuppressants: Secondary | ICD-10-CM | POA: Diagnosis not present

## 2021-09-29 DIAGNOSIS — C9000 Multiple myeloma not having achieved remission: Secondary | ICD-10-CM | POA: Diagnosis not present

## 2021-09-29 DIAGNOSIS — D631 Anemia in chronic kidney disease: Secondary | ICD-10-CM | POA: Diagnosis not present

## 2021-09-29 DIAGNOSIS — Z7982 Long term (current) use of aspirin: Secondary | ICD-10-CM | POA: Insufficient documentation

## 2021-09-29 DIAGNOSIS — Z79899 Other long term (current) drug therapy: Secondary | ICD-10-CM

## 2021-09-29 DIAGNOSIS — Z7984 Long term (current) use of oral hypoglycemic drugs: Secondary | ICD-10-CM | POA: Insufficient documentation

## 2021-09-29 DIAGNOSIS — Z7902 Long term (current) use of antithrombotics/antiplatelets: Secondary | ICD-10-CM | POA: Diagnosis not present

## 2021-09-29 LAB — COMPREHENSIVE METABOLIC PANEL
ALT: 19 U/L (ref 0–44)
AST: 19 U/L (ref 15–41)
Albumin: 3.1 g/dL — ABNORMAL LOW (ref 3.5–5.0)
Alkaline Phosphatase: 58 U/L (ref 38–126)
Anion gap: 4 — ABNORMAL LOW (ref 5–15)
BUN: 40 mg/dL — ABNORMAL HIGH (ref 8–23)
CO2: 24 mmol/L (ref 22–32)
Calcium: 8.5 mg/dL — ABNORMAL LOW (ref 8.9–10.3)
Chloride: 107 mmol/L (ref 98–111)
Creatinine, Ser: 1.96 mg/dL — ABNORMAL HIGH (ref 0.61–1.24)
GFR, Estimated: 37 mL/min — ABNORMAL LOW (ref >60)
Glucose, Bld: 64 mg/dL — ABNORMAL LOW (ref 70–99)
Potassium: 3.8 mmol/L (ref 3.5–5.1)
Sodium: 135 mmol/L (ref 135–145)
Total Bilirubin: 0.4 mg/dL (ref 0.3–1.2)
Total Protein: 9.2 g/dL — ABNORMAL HIGH (ref 6.5–8.1)

## 2021-09-29 LAB — CBC WITH DIFFERENTIAL/PLATELET
Abs Immature Granulocytes: 0.01 10*3/uL (ref 0.00–0.07)
Basophils Absolute: 0 10*3/uL (ref 0.0–0.1)
Basophils Relative: 0 %
Eosinophils Absolute: 0.1 10*3/uL (ref 0.0–0.5)
Eosinophils Relative: 1 %
HCT: 29.8 % — ABNORMAL LOW (ref 39.0–52.0)
Hemoglobin: 9.6 g/dL — ABNORMAL LOW (ref 13.0–17.0)
Immature Granulocytes: 0 %
Lymphocytes Relative: 30 %
Lymphs Abs: 1.1 10*3/uL (ref 0.7–4.0)
MCH: 31.9 pg (ref 26.0–34.0)
MCHC: 32.2 g/dL (ref 30.0–36.0)
MCV: 99 fL (ref 80.0–100.0)
Monocytes Absolute: 0.3 10*3/uL (ref 0.1–1.0)
Monocytes Relative: 8 %
Neutro Abs: 2.1 10*3/uL (ref 1.7–7.7)
Neutrophils Relative %: 61 %
Platelets: 225 10*3/uL (ref 150–400)
RBC: 3.01 MIL/uL — ABNORMAL LOW (ref 4.22–5.81)
RDW: 14.8 % (ref 11.5–15.5)
WBC: 3.5 10*3/uL — ABNORMAL LOW (ref 4.0–10.5)
nRBC: 0 % (ref 0.0–0.2)

## 2021-09-29 MED ORDER — PROCHLORPERAZINE MALEATE 10 MG PO TABS: 10.0000 mg | ORAL_TABLET | Freq: Four times a day (QID) | ORAL | 1 refills | Status: DC | PRN

## 2021-09-29 MED ORDER — ACYCLOVIR 400 MG PO TABS: 400.0000 mg | ORAL_TABLET | Freq: Two times a day (BID) | ORAL | 3 refills | Status: DC

## 2021-09-29 MED ORDER — LENALIDOMIDE 10 MG PO CAPS: 10.0000 mg | ORAL_CAPSULE | Freq: Every day | ORAL | 0 refills | Status: DC

## 2021-09-29 MED ORDER — EPOETIN ALFA-EPBX 40000 UNIT/ML IJ SOLN
40000.0000 [IU] | Freq: Once | INTRAMUSCULAR | Status: AC
  Administered 2021-09-29: 40000 [IU] via SUBCUTANEOUS
  Filled 2021-09-29: qty 1

## 2021-09-29 MED ORDER — ONDANSETRON HCL 8 MG PO TABS: 8.0000 mg | ORAL_TABLET | Freq: Two times a day (BID) | ORAL | 1 refills | Status: DC | PRN

## 2021-09-29 NOTE — Progress Notes (Signed)
START ON PATHWAY REGIMEN - Multiple Myeloma and Other Plasma Cell Dyscrasias     A cycle is every 21 days:     Bortezomib      Lenalidomide      Dexamethasone   **Always confirm dose/schedule in your pharmacy ordering system**  Patient Characteristics: Multiple Myeloma, Newly Diagnosed, Transplant Ineligible or Refused, Unknown or Awaiting Test Results Disease Classification: Multiple Myeloma R-ISS Staging: Unknown Therapeutic Status: Newly Diagnosed Is Patient Eligible for Transplant<= Transplant Ineligible or Refused Risk Status: Unknown Intent of Therapy: Non-Curative / Palliative Intent, Discussed with Patient

## 2021-09-29 NOTE — Progress Notes (Signed)
Oral Chemotherapy Pharmacist Encounter   Patient was not available to speak with me at this time. Spoke with his partner Ivin Booty who was present during his appt this morning. Instructed Ivin Booty to call [facility] on Wednesday to set-up delivery. She knows he would ideally start on Friday 10/03/21 along with his Velcade.   Reviewed the REMs survey questions with Ivin Booty. Provider Ivin Booty with my number to call if she has trouble getting the medication filled at [facility].  I will educate him in person on the lenalidomide on 10/03/21 during his infusion appt.    Darl Pikes, PharmD, BCPS, BCOP, CPP Hematology/Oncology Clinical Pharmacist [address]/DB/AP [facility] [phone]  09/29/2021 3:19 PM

## 2021-09-29 NOTE — Telephone Encounter (Signed)
Per Dr. Janese Banks patient needs to be seen by nephrology and endocrinology ASAP. Patient looks like he has been seen by [facility] in the last year as well as Endo at [place] clinic. LVM for friend Ivin Booty to see if there were specific times they wanted appointments for me to schedule for them or if they wanted to call themselves to make an appointment at their convenience. LVM for them to call back in regards.

## 2021-09-29 NOTE — Addendum Note (Signed)
Addended by: Delice Bison E on: 09/29/2021 01:29 PM   Modules accepted: Orders

## 2021-09-29 NOTE — Progress Notes (Signed)
Patient on plan of care prior to pathways. 

## 2021-09-29 NOTE — Progress Notes (Signed)
Hematology/Oncology Consult note [facility]  Telephone:([phone] [phone]  Patient Care Team: Dion Body, MD as PCP - General (Family Medicine) [place], [facility] (Optometry) Sindy Guadeloupe, MD as Consulting Physician (Oncology)   Name of the patient: Nicholas Weiss  [id number]  02-16-1951   Date of visit: 09/29/21  Diagnosis-IgG lambda multiple myeloma.  Staging pending  Chief complaint/ Reason for visit-discuss bone marrow biopsy results and further management  Heme/Onc history: Patient is a 71 year old African-American male with a past medical history significant for uncontrolled type 1 diabetes, chronic kidney disease stage III chronic normocytic anemia referred for abnormal SPEP. Patient's creatinine has been fluctuating between 1.5-2 but about 5 months ago it went up all the way to 5 and his blood sugars were in the 1000 range. More recently his kidney numbers have been drifting back to normal values. As a part of the work-up he had serum protein electrophoresis done which showed an elevated gammaglobulin fraction with an M spike of 3%. The amount of M spike was not quantified in the specimen. He has therefore been referred to [place] for further management. Patient endorses chronic fatigue reports that his appetite and weight have remained stable   Results of blood work from 04/08/2020 were as follows: CBC showed white count of 2.9, H&H of 10.5/31.2 with an MCV of 91 and a platelet count of 191.  CMP showed a mildly elevated creatinine of 1.2 which was better as compared to 5 months ago when it was 3.9.  Total protein was mildly elevated at 9.1 calcium normal at 8.4 ferritin and iron studies B12 folate TSH and haptoglobin were normal.  Myeloma panel revealed an elevated IgG level of 06/04/2004 with an M protein of 3.1 g.  Immunofixation showed IgG lambda specificity.  Serum free light chain ratio was elevated at 25 and free light chain lambda  elevated at 370   Further myeloma work-up including a PET CT scan did not reveal any evidence of edematous lesions.  Bone marrow biopsy showed 23% plasma cells by manual count and 30% by CD138 IHC.  Normal cytogenetics.  FISH studies for myeloma showed gain of 1 q.  13 q-. detected.  P53 not detected.  He was treated for a year and has having smoldering multiple myeloma since both CKD and anemia have been stable for 3 to 4 years and could be secondary to uncontrolled diabetes.[name] 2023 IgG levels increased to 5463 with an M protein of 4.1 g as compared to 2.6 g in September 2022.  Serum free light chain ratio remains around 25.  Therefore a repeat bone marrow biopsy was done which shows further increase in plasma cells ranging from 30 to 70% and by immunohistochemistry 60 to 70% of the cells were positive for CD138    Interval history-patient was recently admitted to [facility] for another episode of uncontrolled hyperglycemia.  Today he is in the clinic with his wife.  He reports random episodes of twitching which come and go.  He is somewhat drowsy.  ECOG PS- 1 Pain scale- 0   Review of systems- Review of Systems  Constitutional:  Positive for malaise/fatigue. Negative for chills, fever and weight loss.  HENT:  Negative for congestion, ear discharge and nosebleeds.   Eyes:  Negative for blurred vision.  Respiratory:  Negative for cough, hemoptysis, sputum production, shortness of breath and wheezing.   Cardiovascular:  Negative for chest pain, palpitations, orthopnea and claudication.  Gastrointestinal:  Negative  for abdominal pain, blood in stool, constipation, diarrhea, heartburn, melena, nausea and vomiting.  Genitourinary:  Negative for dysuria, flank pain, frequency, hematuria and urgency.  Musculoskeletal:  Negative for back pain, joint pain and myalgias.  Skin:  Negative for rash.  Neurological:  Positive for tremors and sensory change (Peripheral neuropathy). Negative for dizziness,  tingling, focal weakness, seizures, weakness and headaches.  Endo/Heme/Allergies:  Does not bruise/bleed easily.  Psychiatric/Behavioral:  Negative for depression and suicidal ideas. The patient does not have insomnia.       Allergies  Allergen Reactions   Penicillins Other (See Comments)    Has patient had a PCN reaction causing immediate rash, facial/tongue/throat swelling, SOB or lightheadedness with hypotension: No Has patient had a PCN reaction causing severe rash involving mucus membranes or skin necrosis: No Has patient had a PCN reaction that required hospitalization No Has patient had a PCN reaction occurring within the last 10 years: No If all of the above answers are "NO", then may proceed with Cephalosporin use.  Other reaction(s): Other (see comments) Other reaction(s): Other (See Comments) Has patient had a PCN reaction causing immediate rash, facial/tongue/throat swelling, SOB or lightheadedness with hypotension: No Has patient had a PCN reaction causing severe rash involving mucus membranes or skin necrosis: No Has patient had a PCN reaction that required hospitalization No Has patient had a PCN reaction occurring within the last 10 years: No If all of the above answers are "NO", then may proceed with Cephalosporin use. Has patient had a PCN reaction causing immediate rash, facial/tongue/throat swelling, SOB or lightheadedness with hypotension: No Has patient had a PCN reaction causing severe rash involving mucus membranes or skin necrosis: No Has patient had a PCN reaction that required hospitalization No Has patient had a PCN reaction occurring within the last 10 years: No If all of the above answers are "NO", then may proceed with Cephalosporin use.     Past Medical History:  Diagnosis Date   DKA (diabetic ketoacidoses) 04/06/2016   Hypercholesteremia    Hypertension      Past Surgical History:  Procedure Laterality Date   COLONOSCOPY WITH PROPOFOL N/A  02/01/2020   Procedure: COLONOSCOPY WITH PROPOFOL;  Surgeon: Toney Reil, MD;  Location: [facility] ENDOSCOPY;  Service: Gastroenterology;  Laterality: N/A;   ESOPHAGOGASTRODUODENOSCOPY  02/01/2020   Procedure: ESOPHAGOGASTRODUODENOSCOPY (EGD);  Surgeon: Toney Reil, MD;  Location: [facility] ENDOSCOPY;  Service: Gastroenterology;;   KNEE SURGERY Right    Torn meniscus   KNEE SURGERY Left     Social History   Socioeconomic History   Marital status: Single    Spouse name: Not on file   Number of children: 3   Years of education: Not on file   Highest education level: High school graduate  Occupational History    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  Tobacco Use   Smoking status: Never   Smokeless tobacco: Never  Vaping Use   Vaping Use: Never used  Substance and Sexual Activity   Alcohol use: Not Currently    Alcohol/week: 0.0 - 1.0 standard drinks of alcohol    Comment: "once every 2 months"   Drug use: Not Currently    Types: Marijuana    Comment: last week   Sexual activity: Yes    Birth control/protection: None  Other Topics Concern   Not on file  Social History Narrative   Lives with girlfriend "sharon"   Social Determinants of Health   Financial Resource Strain: Low Risk  (01/01/2020)  Overall Financial Resource Strain (CARDIA)    Difficulty of Paying Living Expenses: Not hard at all  Food Insecurity: No Food Insecurity (01/01/2020)   Hunger Vital Sign    Worried About Running Out of Food in the Last Year: Never true    Ran Out of Food in the Last Year: Never true  Transportation Needs: No Transportation Needs (01/01/2020)   PRAPARE - Hydrologist (Medical): No    Lack of Transportation (Non-Medical): No  Physical Activity: Insufficiently Active (01/01/2020)   Exercise Vital Sign    Days of Exercise per Week: 7 days    Minutes of Exercise per Session: 20 min  Stress: No Stress Concern Present (01/01/2020)   [place]    Feeling of Stress : Not at all  Social Connections: Moderately Isolated (01/01/2020)   Social Connection and Isolation Panel [NHANES]    Frequency of Communication with Friends and Family: More than three times a week    Frequency of Social Gatherings with Friends and Family: More than three times a week    Attends Religious Services: Never    Marine scientist or Organizations: No    Attends Archivist Meetings: Never    Marital Status: Living with partner  Intimate Partner Violence: Not At Risk (01/01/2020)   Humiliation, Afraid, Rape, and Kick questionnaire    Fear of Current or Ex-Partner: No    Emotionally Abused: No    Physically Abused: No    Sexually Abused: No    Family History  Problem Relation Age of Onset   Heart attack Father    Hypertension Sister    Cancer Sister      Current Outpatient Medications:    Alcohol Swabs (B-D SINGLE USE SWABS REGULAR) PADS, Use to check blood sugar four times daily for type 1 diabetes, Disp: 300 each, Rfl: 4   atorvastatin (LIPITOR) 80 MG tablet, Take 1 tablet (80 mg total) by mouth at bedtime., Disp: 30 tablet, Rfl: 2   Blood Glucose Calibration (TRUE METRIX LEVEL 1) Low SOLN, Use to check blood sugar four times daily for type 1 diabetes, Disp: 1 each, Rfl: 2   clopidogrel (PLAVIX) 75 MG tablet, Take 1 tablet (75 mg total) by mouth daily., Disp: 20 tablet, Rfl: 0   famotidine (PEPCID) 20 MG tablet, Take by mouth., Disp: , Rfl:    FARXIGA 10 MG TABS tablet, Take 10 mg by mouth daily., Disp: , Rfl:    gabapentin (NEURONTIN) 300 MG capsule, Take 300 mg by mouth 2 (two) times daily., Disp: , Rfl:    insulin degludec (TRESIBA FLEXTOUCH) 100 UNIT/ML FlexTouch Pen, 15 units daily. (Patient taking differently: Inject 20 Units into the skin daily. 20 units daily.), Disp: 30 mL, Rfl: 3   Insulin Syringe-Needle U-100 (INSULIN SYRINGE 1CC/31GX5/16") 31G X 5/16" 1 ML MISC, For  insulin injections up to 4 times daily, Disp: 270 each, Rfl: 4   lisinopril (ZESTRIL) 2.5 MG tablet, Take 2.5 mg by mouth daily., Disp: , Rfl:    NOVOLOG 100 UNIT/ML injection, INJECT UP TO 14 UNITS THREE TIMES DAILY BEFORE MEALS ACCORDING TO SLIDING SCALE, Disp: 10 mL, Rfl: 1   ondansetron (ZOFRAN) 4 MG tablet, Take 1 tablet (4 mg total) by mouth every 8 (eight) hours as needed for nausea or vomiting., Disp: 20 tablet, Rfl: 0   aspirin 81 MG chewable tablet, Chew 1 tablet (81 mg total) by mouth daily. (  Patient not taking: Reported on 09/23/2021), Disp: 30 tablet, Rfl: 11   lenalidomide (REVLIMID) 10 MG capsule, Take 1 capsule (10 mg total) by mouth daily. Take for 14 days, then hold for 7 days. Repeat every 21 days., Disp: 14 capsule, Rfl: 0  Physical exam:  Vitals:   09/29/21 0924  BP: (!) 143/90  Pulse: 77  Resp: 16  Temp: 97.6 F (36.4 C)  TempSrc: Tympanic  SpO2: 100%  Weight: 161 lb 11.2 oz (73.3 kg)  Height: 5' 10.5" (1.791 m)   Physical Exam Cardiovascular:     Rate and Rhythm: Normal rate and regular rhythm.     Heart sounds: Normal heart sounds.  Pulmonary:     Effort: Pulmonary effort is normal.     Breath sounds: Normal breath sounds.  Abdominal:     General: Bowel sounds are normal.     Palpations: Abdomen is soft.  Skin:    General: Skin is warm and dry.  Neurological:     Mental Status: He is alert and oriented to person, place, and time.     Comments: Twitching noted in bilateral shoulders and hands         Latest Ref Rng & Units 09/29/2021    9:05 AM  CMP  Glucose 70 - 99 mg/dL 64   BUN 8 - 23 mg/dL 40   Creatinine 0.61 - 1.24 mg/dL 1.96   Sodium 135 - 145 mmol/L 135   Potassium 3.5 - 5.1 mmol/L 3.8   Chloride 98 - 111 mmol/L 107   CO2 22 - 32 mmol/L 24   Calcium 8.9 - 10.3 mg/dL 8.5   Total Protein 6.5 - 8.1 g/dL 9.2   Total Bilirubin 0.3 - 1.2 mg/dL 0.4   Alkaline Phos 38 - 126 U/L 58   AST 15 - 41 U/L 19   ALT 0 - 44 U/L 19       Latest Ref  Rng & Units 09/29/2021    9:05 AM  CBC  WBC 4.0 - 10.5 K/uL 3.5   Hemoglobin 13.0 - 17.0 g/dL 9.6   Hematocrit 39.0 - 52.0 % 29.8   Platelets 150 - 400 K/uL 225     No images are attached to the encounter.  CT BONE MARROW BIOPSY & ASPIRATION  Result Date: 09/23/2021 CLINICAL DATA:  Smoldering IgG lambda multiple myeloma not currently on treatment however recent increase in IgG M protein. Prior bone marrow biopsy on 04/29/2020. EXAM: CT GUIDED BONE MARROW ASPIRATION AND BIOPSY ANESTHESIA/SEDATION: Moderate (conscious) sedation was employed during this procedure. A total of Versed 1.0 mg and Fentanyl 100 mcg was administered intravenously by radiology nursing. Moderate Sedation Time: 18 minutes. The patient's level of consciousness and vital signs were monitored continuously by radiology nursing throughout the procedure under my direct supervision. PROCEDURE: The procedure risks, benefits, and alternatives were explained to the patient. Questions regarding the procedure were encouraged and answered. The patient understands and consents to the procedure. A time out was performed prior to initiating the procedure. The right gluteal region was prepped with chlorhexidine. Sterile gown and sterile gloves were used for the procedure. Local anesthesia was provided with 1% Lidocaine. Under CT guidance, an 11 gauge On Control bone cutting needle was advanced from a posterior approach into the right iliac bone. Needle positioning was confirmed with CT. Initial non heparinized and heparinized aspirate samples were obtained of bone marrow. Core biopsy was performed via the On Control drill needle. COMPLICATIONS: None FINDINGS: Inspection of initial aspirate  did reveal visible particles. Intact core biopsy sample was obtained. IMPRESSION: CT guided bone marrow biopsy of right posterior iliac bone with both aspirate and core samples obtained. Electronically Signed   By: Aletta Edouard M.D.   On: 09/23/2021 09:59      Assessment and plan- Patient is a 72 y.o. male who is here for follow-up of following issues:  Multiple myeloma: IgG is now significantly elevated with an M protein of 4 g as compared to September 2022.  Bone marrow biopsy also shows significant increase in plasma cells and by immunohistochemistry CD138 positive cells were about 60 to 70% which confirms the diagnosis of multiple myeloma.  Patient went for his PET scan but was not feeling well enough to go through it and therefore we need to reschedule his PET scan at this time.  His previous bone marrow biopsy did show 1 q. gain which puts him at high risk disease.  Awaiting cytogenetics and FISH studies.  Given his uncontrolled diabetes, use of marijuana and cocaine, underlying CKD-I do not think that the patient is a candidate for bone marrow transplant down the line but I will refer him to [facility] myeloma service for further assessment.  For now I am proceeding as if he would not be a transplant candidate and therefore offering him a triplet regimen instead of a D RVD regimen.  Patient has some mild baseline neuropathy and therefore I will give him weekly Velcade instead of twice weekly regimen at 1.3 mg per metered square along with Revlimid 10 mg 2 weeks on and 1 week off.  Decadron will be 20 mg weekly due to his underlying diabetes instead of 40 mg.  If he tolerates the present dose of Revlimid better I will increase it to 15 mg given his underlying CKD.  I am not giving him pulse dose steroids at this time given his recent admission to [facility] for uncontrolled diabetes.  Patient will get his first dose of Velcade and Decadron in 4 days time.  He will also be receiving his Revlimid prescription this week.  I am doing Velcade weekly given his baseline neuropathy.  In a multicenter, open-label phase 3 trial (SWOG S0777), 525 patients with previously untreated MM were randomly assigned to receive six months of induction therapy with either VRd or Rd, each  followed by Rd maintenance until progression or unacceptable toxicity. Approximately one-third of patients had high-risk disease. At a median follow-up of 84 months, VRd resulted in the following: Higher rates of overall response (OR; 82 versus 72 percent) and complete response (CR; 16 versus 8 percent) Superior progression-free survival (PFS; median 41 versus 29 months; hazard ratio [HR] 0.74; 95% CI 0.59-0.93) Superior OS (median not reached versus 69 months; HR 0.71; 95% CI 0.54-0.93)   Discussed risks and benefits of Revlimid including all but not limited to nausea, vomiting, low blood counts, diarrhea, skin rash as well as risk of thrombosis.  He will need to stay on low-dose aspirin  While patient is on Velcade he will also need to stay on acyclovir prophylaxis and I will be sending a prescription for the same.  I am awaiting PET CT scan to decide if he would be requiring bisphosphonates or not.  His prior bone survey in May 2023 did not reveal any evidence of lytic lesions.  Patient noted to have some random episodes of twitching especially involving his bilateral shoulders and hands.  He also has baseline neuropathy.  I am referring him to Dr. Mickeal Skinner  for this.  Patient has previously seen Dr. Gabriel Carina for his diabetes and we will reach out to her office to reestablish care since his blood sugars are likely to fluctuate during his myeloma treatment  Patient was supposed to see nephrology as an outpatient but did not go there due to his hospitalization and I am referring him again to nephrology at this time.  Patient will proceed with Velcade this week and then on a weekly basis and I will see him back in 2 weeks.  Normocytic anemia: Likely secondary to myeloma and chronic kidney disease.  He will receive his Retacrit today.   Visit Diagnosis 1. Anemia of chronic kidney failure, stage 3 (moderate) ([place])   2. Muscle twitching   3. Multiple myeloma not having achieved remission ([place])       Dr. Randa Evens, MD, MPH [facility] at [facility] [phone] 09/29/2021 12:25 PM

## 2021-10-01 ENCOUNTER — Encounter (HOSPITAL_COMMUNITY): Payer: Self-pay | Admitting: Oncology

## 2021-10-01 LAB — KAPPA/LAMBDA LIGHT CHAINS
Kappa free light chain: 14.4 mg/L (ref 3.3–19.4)
Kappa, lambda light chain ratio: 0.07 — ABNORMAL LOW (ref 0.26–1.65)
Lambda free light chains: 203.2 mg/L — ABNORMAL HIGH (ref 5.7–26.3)

## 2021-10-03 ENCOUNTER — Inpatient Hospital Stay: Payer: Medicare Other | Admitting: Pharmacist

## 2021-10-03 ENCOUNTER — Inpatient Hospital Stay: Payer: Medicare HMO

## 2021-10-03 ENCOUNTER — Inpatient Hospital Stay: Payer: Medicare Other

## 2021-10-03 ENCOUNTER — Other Ambulatory Visit: Payer: Self-pay

## 2021-10-03 VITALS — BP 154/94 | HR 80 | Temp 97.8°F | Resp 17 | Wt 164.7 lb

## 2021-10-03 DIAGNOSIS — C9000 Multiple myeloma not having achieved remission: Secondary | ICD-10-CM

## 2021-10-03 DIAGNOSIS — Z5112 Encounter for antineoplastic immunotherapy: Secondary | ICD-10-CM | POA: Diagnosis not present

## 2021-10-03 MED ORDER — BORTEZOMIB CHEMO SQ INJECTION 3.5 MG (2.5MG/ML)
1.3000 mg/m2 | Freq: Once | INTRAMUSCULAR | Status: AC
  Administered 2021-10-03: 2.5 mg via SUBCUTANEOUS
  Filled 2021-10-03: qty 1

## 2021-10-03 MED ORDER — DEXAMETHASONE 4 MG PO TABS
20.0000 mg | ORAL_TABLET | Freq: Once | ORAL | Status: AC
  Administered 2021-10-03: 20 mg via ORAL
  Filled 2021-10-03: qty 5

## 2021-10-03 NOTE — Patient Instructions (Signed)
[facility] AT [place]  Discharge Instructions: Thank you for choosing [facility] to provide your oncology and hematology care.  If you have a lab appointment with the [place], please go directly to the [place] [facility] and check in at the registration area.  Wear comfortable clothing and clothing appropriate for easy access to any Portacath or PICC line.   We strive to give you quality time with your provider. You may need to reschedule your appointment if you arrive late (15 or more minutes).  Arriving late affects you and other patients whose appointments are after yours.  Also, if you miss three or more appointments without notifying the office, you may be dismissed from the clinic at the provider's discretion.      For prescription refill requests, have your pharmacy contact our office and allow 72 hours for refills to be completed.    Today you received the following chemotherapy and/or immunotherapy agents: VELCADE injection   To help prevent nausea and vomiting after your treatment, we encourage you to take your nausea medication as directed.  BELOW ARE SYMPTOMS THAT SHOULD BE REPORTED IMMEDIATELY: *FEVER GREATER THAN 100.4 F (38 C) OR HIGHER *CHILLS OR SWEATING *NAUSEA AND VOMITING THAT IS NOT CONTROLLED WITH YOUR NAUSEA MEDICATION *UNUSUAL SHORTNESS OF BREATH *UNUSUAL BRUISING OR BLEEDING *URINARY PROBLEMS (pain or burning when urinating, or frequent urination) *BOWEL PROBLEMS (unusual diarrhea, constipation, pain near the anus) TENDERNESS IN MOUTH AND THROAT WITH OR WITHOUT PRESENCE OF ULCERS (sore throat, sores in mouth, or a toothache) UNUSUAL RASH, SWELLING OR PAIN  UNUSUAL VAGINAL DISCHARGE OR ITCHING   Items with * indicate a potential emergency and should be followed up as soon as possible or go to the Emergency Department if any problems should occur.  Please show the CHEMOTHERAPY ALERT CARD or IMMUNOTHERAPY ALERT CARD at  check-in to the Emergency Department and triage nurse.  Should you have questions after your visit or need to cancel or reschedule your appointment, please contact [facility] [place] AT [place]  [phone] and follow the prompts.  Office hours are 8:00 a.m. to 4:30 p.m. Monday - Friday. Please note that voicemails left after 4:00 p.m. may not be returned until the following business day.  We are closed weekends and major holidays. You have access to a nurse at all times for urgent questions. Please call the main number to the clinic [phone] and follow the prompts.  For any non-urgent questions, you may also contact your provider using MyChart. We now offer e-Visits for anyone 75 and older to request care online for non-urgent symptoms. For details visit [email or web address].   Also download the MyChart app! Go to the app store, search "MyChart", open the app, select [place], and log in with your MyChart username and password.  Masks are optional in the cancer centers. If you would like for your care team to wear a mask while they are taking care of you, please let them know. For doctor visits, patients may have with them one support person who is at least 69 years old. At this time, visitors are not allowed in the infusion area.

## 2021-10-03 NOTE — Progress Notes (Signed)
Ok to treat with today's labs per MD. Creatinine 1.96  T.Jenetta Downer Dr Lars Masson, PharmD

## 2021-10-03 NOTE — Progress Notes (Signed)
[place]  [phone] [phone]  Patient Care Team: Dion Body, MD as PCP - General (Family Medicine) [place], [place] (Optometry) Sindy Guadeloupe, MD as Consulting Physician (Oncology)   Name of the patient: Nicholas Weiss  [id number]  Mar 16, 1951   Date of visit: 10/03/21  HPI: Patient is a 71 y.o. male with newly diagnosed IgG lambda multiple myeloma (previously smoldering myeloma). Planned treatment with lenalidomide, bortezomib and dexamethasone. Treatment to start today 10/03/21.  Reason for Consult: Revlimid (lenalidomide) oral chemotherapy education.   PAST MEDICAL HISTORY: Past Medical History:  Diagnosis Date   DKA (diabetic ketoacidoses) 04/06/2016   Hypercholesteremia    Hypertension     HEMATOLOGY/ONCOLOGY HISTORY:  Oncology History  Multiple myeloma ([place])  09/29/2021 Initial Diagnosis   Multiple myeloma ([place])   09/29/2021 Cancer Staging   Staging form: Plasma Cell Myeloma and Plasma Cell Disorders, AJCC 8th Edition - Clinical stage from 09/29/2021: No stage assigned - Signed by Sindy Guadeloupe, MD on 09/29/2021 Stage prefix: Initial diagnosis   Multiple myeloma not having achieved remission ([place])  09/29/2021 Initial Diagnosis   Multiple myeloma not having achieved remission ([place])   10/03/2021 -  Chemotherapy   Patient is on Treatment Plan : MYELOMA NON-TRANSPLANT CANDIDATES VRd weekly q21d       ALLERGIES:  is allergic to penicillins.  MEDICATIONS:  Current Outpatient Medications  Medication Sig Dispense Refill   acyclovir (ZOVIRAX) 400 MG tablet Take 1 tablet (400 mg total) by mouth 2 (two) times daily. 60 tablet 3   Alcohol Swabs (B-D SINGLE USE SWABS REGULAR) PADS Use to check blood sugar four times daily for type 1 diabetes 300 each 4   aspirin 81 MG chewable tablet Chew 1 tablet (81 mg total) by mouth daily. (Patient not taking: Reported on 09/23/2021) 30 tablet 11   atorvastatin  (LIPITOR) 80 MG tablet Take 1 tablet (80 mg total) by mouth at bedtime. 30 tablet 2   Blood Glucose Calibration (TRUE METRIX LEVEL 1) Low SOLN Use to check blood sugar four times daily for type 1 diabetes 1 each 2   clopidogrel (PLAVIX) 75 MG tablet Take 1 tablet (75 mg total) by mouth daily. 20 tablet 0   famotidine (PEPCID) 20 MG tablet Take by mouth.     FARXIGA 10 MG TABS tablet Take 10 mg by mouth daily.     gabapentin (NEURONTIN) 300 MG capsule Take 300 mg by mouth 2 (two) times daily.     insulin degludec (TRESIBA FLEXTOUCH) 100 UNIT/ML FlexTouch Pen 15 units daily. (Patient taking differently: Inject 20 Units into the skin daily. 20 units daily.) 30 mL 3   Insulin Syringe-Needle U-100 (INSULIN SYRINGE 1CC/31GX5/16") 31G X 5/16" 1 ML MISC For insulin injections up to 4 times daily 270 each 4   lenalidomide (REVLIMID) 10 MG capsule Take 1 capsule (10 mg total) by mouth daily. Take for 14 days, then hold for 7 days. Repeat every 21 days. 14 capsule 0   lisinopril (ZESTRIL) 2.5 MG tablet Take 2.5 mg by mouth daily.     NOVOLOG 100 UNIT/ML injection INJECT UP TO 14 UNITS THREE TIMES DAILY BEFORE MEALS ACCORDING TO SLIDING SCALE 10 mL 1   ondansetron (ZOFRAN) 4 MG tablet Take 1 tablet (4 mg total) by mouth every 8 (eight) hours as needed for nausea or vomiting. 20 tablet 0   ondansetron (ZOFRAN) 8 MG tablet Take 1 tablet (8 mg total) by mouth 2 (two) times daily  as needed (Nausea or vomiting). 30 tablet 1   prochlorperazine (COMPAZINE) 10 MG tablet Take 1 tablet (10 mg total) by mouth every 6 (six) hours as needed (Nausea or vomiting). 30 tablet 1   No current facility-administered medications for this visit.    VITAL SIGNS: There were no vitals taken for this visit. There were no vitals filed for this visit.  Estimated body mass index is 23.3 kg/m as calculated from the following:   Height as of 09/29/21: 5' 10.5" (1.791 m).   Weight as of an earlier encounter on 10/03/21: 74.7 kg (164 lb 10.9  oz).  LABS: CBC:    Component Value Date/Time   WBC 3.5 (L) 09/29/2021 0905   HGB 9.6 (L) 09/29/2021 0905   HGB 11.2 (L) 10/03/2020 1317   HCT 29.8 (L) 09/29/2021 0905   HCT 33.1 (L) 10/03/2020 1317   PLT 225 09/29/2021 0905   PLT 202 10/03/2020 1317   MCV 99.0 09/29/2021 0905   MCV 89 10/03/2020 1317   MCV 91 10/11/2012 0542   NEUTROABS 2.1 09/29/2021 0905   NEUTROABS 11.6 (H) 10/11/2012 0542   LYMPHSABS 1.1 09/29/2021 0905   LYMPHSABS 0.7 (L) 10/11/2012 0542   MONOABS 0.3 09/29/2021 0905   MONOABS 0.3 10/11/2012 0542   EOSABS 0.1 09/29/2021 0905   EOSABS 0.0 10/11/2012 0542   BASOSABS 0.0 09/29/2021 0905   BASOSABS 0.0 10/11/2012 0542   Comprehensive Metabolic Panel:    Component Value Date/Time   NA 135 09/29/2021 0905   NA 135 10/03/2020 1317   NA 137 10/11/2012 0542   K 3.8 09/29/2021 0905   K 4.2 10/11/2012 0542   CL 107 09/29/2021 0905   CL 105 10/11/2012 0542   CO2 24 09/29/2021 0905   CO2 26 10/11/2012 0542   BUN 40 (H) 09/29/2021 0905   BUN 24 10/03/2020 1317   BUN 17 10/11/2012 0542   CREATININE 1.96 (H) 09/29/2021 0905   CREATININE 1.36 (H) 10/11/2012 0542   GLUCOSE 64 (L) 09/29/2021 0905   GLUCOSE 239 (H) 10/11/2012 0542   CALCIUM 8.5 (L) 09/29/2021 0905   CALCIUM 8.7 10/11/2012 0542   AST 19 09/29/2021 0905   ALT 19 09/29/2021 0905   ALKPHOS 58 09/29/2021 0905   BILITOT 0.4 09/29/2021 0905   BILITOT 0.4 10/03/2020 1317   PROT 9.2 (H) 09/29/2021 0905   PROT 9.1 (H) 10/03/2020 1317   ALBUMIN 3.1 (L) 09/29/2021 0905   ALBUMIN 3.3 (L) 10/03/2020 1317     Present during today's visit: Patient only, seen in infusion  Start plan: patient was to start today, but has not yet heard from or called [place]. He will start the lenalidomide when he has the medication in hand Patient was instructed to remain on schedule and stop his lenalidomide after day 14 of his cycle   Patient Education I spoke with patient for overview of new  oral chemotherapy medication: lenalidomide   Administration: Counseled patient on administration, dosing, side effects, monitoring, drug-food interactions, safe handling, storage, and disposal. Patient will take 1 capsule (10 mg total) by mouth daily. Take for 14 days, then hold for 7 days. Repeat every 21 days.  Patient will also take the following: Aspirin 14m by mouth daily Patient still needs to put this up, instructed to start as soon as possible Acyclovir Patient still needs to pick this up, instructed to start as soon as possible  Side Effects: Side effects include but not limited to: rash/itchy skin, N/V, fatigue,  decreased wbc/hgb/plt, constipation or diarrhea.    Drug-drug Interactions (DDI): No current lenalidomide DDI  Adherence: After discussion with patient no patient barriers to medication adherence identified.  Reviewed with patient importance of keeping a medication schedule and plan for any missed doses.  Mr. Unangst voiced understanding and appreciation. All questions answered. Medication handout provided.  Provided patient with Oral [facility] phone number. Patient knows to call the office with questions or concerns. Oral Chemotherapy Navigation [facility] will continue to follow.  Patient expressed understanding and was in agreement with this plan. He also understands that He can call clinic at any time with any questions, concerns, or complaints.   Medication Access Issues: Patient was provided with the nursing line phone number for [facility]. Asked patient to call today  Follow-up plan: RTC in one week  Thank you for allowing me to participate in the care of this patient.   Time Total: 20 mins  Visit consisted of counseling and education on dealing with issues of symptom management in the setting of serious and potentially life-threatening illness.Greater than 50%  of this time was spent counseling and coordinating care  related to the above assessment and plan.  Signed by: Darl Pikes, PharmD, BCPS, Salley Slaughter, CPP Hematology/Oncology Clinical Pharmacist Practitioner [place]/DB/AP [facility] [phone]  10/03/2021 3:53 PM

## 2021-10-03 NOTE — Progress Notes (Signed)
Patient tolerated first Velcade injection well today, no questions/concerns voiced. Patient monitored post injection.Stable at discharge. AVS given.

## 2021-10-06 ENCOUNTER — Ambulatory Visit: Payer: Medicare HMO

## 2021-10-06 ENCOUNTER — Other Ambulatory Visit: Payer: Medicare HMO

## 2021-10-06 ENCOUNTER — Ambulatory Visit: Payer: Medicare HMO | Admitting: Oncology

## 2021-10-06 LAB — MULTIPLE MYELOMA PANEL, SERUM
Albumin SerPl Elph-Mcnc: 3.4 g/dL (ref 2.9–4.4)
Albumin/Glob SerPl: 0.7 (ref 0.7–1.7)
Alpha 1: 0.2 g/dL (ref 0.0–0.4)
Alpha2 Glob SerPl Elph-Mcnc: 0.5 g/dL (ref 0.4–1.0)
B-Globulin SerPl Elph-Mcnc: 0.7 g/dL (ref 0.7–1.3)
Gamma Glob SerPl Elph-Mcnc: 4 g/dL — ABNORMAL HIGH (ref 0.4–1.8)
Globulin, Total: 5.4 g/dL — ABNORMAL HIGH (ref 2.2–3.9)
IgA: 17 mg/dL — ABNORMAL LOW (ref 61–437)
IgG (Immunoglobin G), Serum: 4999 mg/dL — ABNORMAL HIGH (ref 603–1613)
IgM (Immunoglobulin M), Srm: <5 mg/dL — ABNORMAL LOW (ref 20–172)
M Protein SerPl Elph-Mcnc: 3.5 g/dL — ABNORMAL HIGH
Total Protein ELP: 8.8 g/dL — ABNORMAL HIGH (ref 6.0–8.5)

## 2021-10-07 ENCOUNTER — Encounter (HOSPITAL_COMMUNITY): Payer: Self-pay | Admitting: Oncology

## 2021-10-09 ENCOUNTER — Telehealth: Payer: Self-pay | Admitting: Pharmacist

## 2021-10-09 ENCOUNTER — Telehealth: Payer: Self-pay

## 2021-10-09 ENCOUNTER — Ambulatory Visit
Admission: RE | Admit: 2021-10-09 | Discharge: 2021-10-09 | Disposition: A | Discharge status: Home / Self Care | Payer: Medicare Other | Source: Ambulatory Visit | Attending: Oncology | Admitting: Oncology

## 2021-10-09 DIAGNOSIS — D472 Monoclonal gammopathy: Secondary | ICD-10-CM | POA: Diagnosis not present

## 2021-10-09 DIAGNOSIS — C9000 Multiple myeloma not having achieved remission: Secondary | ICD-10-CM | POA: Diagnosis present

## 2021-10-09 LAB — GLUCOSE, CAPILLARY: Glucose-Capillary: 182 mg/dL — ABNORMAL HIGH (ref 70–99)

## 2021-10-09 MED ORDER — FLUDEOXYGLUCOSE F - 18 (FDG) INJECTION
9.1100 | Freq: Once | INTRAVENOUS | Status: AC
  Administered 2021-10-09: 9.11 via INTRAVENOUS

## 2021-10-09 NOTE — Telephone Encounter (Signed)
Oral Chemotherapy Pharmacist Encounter   Attempted to call patient to verify he had his Revlimid delivered and started taking his medication. Patient also as of last Friday's appt needed to pick up aspirin and acyclovir. I also planned to verify he had picked those up.  Unable to reach patient, will attempt to reach him at a later date/time.   Darl Pikes, PharmD, BCPS, BCOP, CPP Hematology/Oncology Clinical Pharmacist [place]/DB/AP [facility] [phone]  10/09/2021 10:11 AM

## 2021-10-09 NOTE — Telephone Encounter (Signed)
Reached out to Dr. Delfino Lovett office at [phone] and spoke to Claiborne Billings the NP coordinator regarding pts referral. Per Claiborne Billings: she has tried contacting pt more than once yesterday with no success. I reached out to the phone number on file (Pts friend Chrystie Nose) and explained the situation. She provided me with pts work phone number stating that is where the pt currently is. Reached out to work number with no answer. Left a VM with my name and call back phone numer for pt to return call.

## 2021-10-10 ENCOUNTER — Inpatient Hospital Stay: Payer: Medicare Other

## 2021-10-10 VITALS — BP 171/93 | HR 75 | Temp 96.0°F | Resp 18 | Wt 159.2 lb

## 2021-10-10 DIAGNOSIS — C9000 Multiple myeloma not having achieved remission: Secondary | ICD-10-CM

## 2021-10-10 DIAGNOSIS — Z5112 Encounter for antineoplastic immunotherapy: Secondary | ICD-10-CM | POA: Diagnosis not present

## 2021-10-10 LAB — CBC WITH DIFFERENTIAL/PLATELET
Abs Immature Granulocytes: 0 10*3/uL (ref 0.00–0.07)
Basophils Absolute: 0 10*3/uL (ref 0.0–0.1)
Basophils Relative: 1 %
Eosinophils Absolute: 0.1 10*3/uL (ref 0.0–0.5)
Eosinophils Relative: 2 %
HCT: 30 % — ABNORMAL LOW (ref 39.0–52.0)
Hemoglobin: 9.7 g/dL — ABNORMAL LOW (ref 13.0–17.0)
Immature Granulocytes: 0 %
Lymphocytes Relative: 36 %
Lymphs Abs: 1.3 10*3/uL (ref 0.7–4.0)
MCH: 32 pg (ref 26.0–34.0)
MCHC: 32.3 g/dL (ref 30.0–36.0)
MCV: 99 fL (ref 80.0–100.0)
Monocytes Absolute: 0.4 10*3/uL (ref 0.1–1.0)
Monocytes Relative: 10 %
Neutro Abs: 1.9 10*3/uL (ref 1.7–7.7)
Neutrophils Relative %: 51 %
Platelets: 295 10*3/uL (ref 150–400)
RBC: 3.03 MIL/uL — ABNORMAL LOW (ref 4.22–5.81)
RDW: 15.3 % (ref 11.5–15.5)
WBC: 3.8 10*3/uL — ABNORMAL LOW (ref 4.0–10.5)
nRBC: 0 % (ref 0.0–0.2)

## 2021-10-10 LAB — COMPREHENSIVE METABOLIC PANEL
ALT: 35 U/L (ref 0–44)
AST: 25 U/L (ref 15–41)
Albumin: 3.3 g/dL — ABNORMAL LOW (ref 3.5–5.0)
Alkaline Phosphatase: 64 U/L (ref 38–126)
Anion gap: 6 (ref 5–15)
BUN: 41 mg/dL — ABNORMAL HIGH (ref 8–23)
CO2: 24 mmol/L (ref 22–32)
Calcium: 8.1 mg/dL — ABNORMAL LOW (ref 8.9–10.3)
Chloride: 102 mmol/L (ref 98–111)
Creatinine, Ser: 2.11 mg/dL — ABNORMAL HIGH (ref 0.61–1.24)
GFR, Estimated: 33 mL/min — ABNORMAL LOW (ref >60)
Glucose, Bld: 137 mg/dL — ABNORMAL HIGH (ref 70–99)
Potassium: 5.2 mmol/L — ABNORMAL HIGH (ref 3.5–5.1)
Sodium: 132 mmol/L — ABNORMAL LOW (ref 135–145)
Total Bilirubin: 0.6 mg/dL (ref 0.3–1.2)
Total Protein: 9.1 g/dL — ABNORMAL HIGH (ref 6.5–8.1)

## 2021-10-10 LAB — LACTATE DEHYDROGENASE: LDH: 115 U/L (ref 98–192)

## 2021-10-10 MED ORDER — DEXAMETHASONE 4 MG PO TABS
20.0000 mg | ORAL_TABLET | Freq: Once | ORAL | Status: AC
  Administered 2021-10-10: 20 mg via ORAL
  Filled 2021-10-10: qty 5

## 2021-10-10 MED ORDER — BORTEZOMIB CHEMO SQ INJECTION 3.5 MG (2.5MG/ML)
1.3000 mg/m2 | Freq: Once | INTRAMUSCULAR | Status: AC
  Administered 2021-10-10: 2.5 mg via SUBCUTANEOUS
  Filled 2021-10-10: qty 1

## 2021-10-10 NOTE — Progress Notes (Signed)
Potassium 5.2 and Creatinine 2.11, per Dr. Janese Banks okay to proceed with Velcade, no new orders at this time.

## 2021-10-11 LAB — BETA 2 MICROGLOBULIN, SERUM: Beta-2 Microglobulin: 5.8 mg/L — ABNORMAL HIGH (ref 0.6–2.4)

## 2021-10-17 ENCOUNTER — Encounter: Payer: Self-pay | Admitting: Oncology

## 2021-10-17 ENCOUNTER — Inpatient Hospital Stay: Payer: Medicare Other

## 2021-10-17 ENCOUNTER — Inpatient Hospital Stay (HOSPITAL_BASED_OUTPATIENT_CLINIC_OR_DEPARTMENT_OTHER): Payer: Medicare Other | Admitting: Oncology

## 2021-10-17 VITALS — Temp 98.0°F

## 2021-10-17 VITALS — BP 153/81 | HR 75 | Resp 18 | Wt 163.4 lb

## 2021-10-17 DIAGNOSIS — Z5111 Encounter for antineoplastic chemotherapy: Secondary | ICD-10-CM

## 2021-10-17 DIAGNOSIS — C9000 Multiple myeloma not having achieved remission: Secondary | ICD-10-CM

## 2021-10-17 DIAGNOSIS — Z5112 Encounter for antineoplastic immunotherapy: Secondary | ICD-10-CM | POA: Diagnosis not present

## 2021-10-17 LAB — COMPREHENSIVE METABOLIC PANEL
ALT: 40 U/L (ref 0–44)
AST: 32 U/L (ref 15–41)
Albumin: 3.2 g/dL — ABNORMAL LOW (ref 3.5–5.0)
Alkaline Phosphatase: 63 U/L (ref 38–126)
Anion gap: 4 — ABNORMAL LOW (ref 5–15)
BUN: 31 mg/dL — ABNORMAL HIGH (ref 8–23)
CO2: 26 mmol/L (ref 22–32)
Calcium: 8.5 mg/dL — ABNORMAL LOW (ref 8.9–10.3)
Chloride: 106 mmol/L (ref 98–111)
Creatinine, Ser: 1.64 mg/dL — ABNORMAL HIGH (ref 0.61–1.24)
GFR, Estimated: 45 mL/min — ABNORMAL LOW (ref >60)
Glucose, Bld: 83 mg/dL (ref 70–99)
Potassium: 4.6 mmol/L (ref 3.5–5.1)
Sodium: 136 mmol/L (ref 135–145)
Total Bilirubin: 0.6 mg/dL (ref 0.3–1.2)
Total Protein: 8.4 g/dL — ABNORMAL HIGH (ref 6.5–8.1)

## 2021-10-17 LAB — CBC WITH DIFFERENTIAL/PLATELET
Abs Immature Granulocytes: 0.02 10*3/uL (ref 0.00–0.07)
Basophils Absolute: 0 10*3/uL (ref 0.0–0.1)
Basophils Relative: 0 %
Eosinophils Absolute: 0.1 10*3/uL (ref 0.0–0.5)
Eosinophils Relative: 2 %
HCT: 28.7 % — ABNORMAL LOW (ref 39.0–52.0)
Hemoglobin: 9.2 g/dL — ABNORMAL LOW (ref 13.0–17.0)
Immature Granulocytes: 1 %
Lymphocytes Relative: 23 %
Lymphs Abs: 1 10*3/uL (ref 0.7–4.0)
MCH: 31.6 pg (ref 26.0–34.0)
MCHC: 32.1 g/dL (ref 30.0–36.0)
MCV: 98.6 fL (ref 80.0–100.0)
Monocytes Absolute: 0.2 10*3/uL (ref 0.1–1.0)
Monocytes Relative: 5 %
Neutro Abs: 2.8 10*3/uL (ref 1.7–7.7)
Neutrophils Relative %: 69 %
Platelets: 180 10*3/uL (ref 150–400)
RBC: 2.91 MIL/uL — ABNORMAL LOW (ref 4.22–5.81)
RDW: 14.9 % (ref 11.5–15.5)
WBC: 4.1 10*3/uL (ref 4.0–10.5)
nRBC: 0 % (ref 0.0–0.2)

## 2021-10-17 MED ORDER — DEXAMETHASONE 4 MG PO TABS
20.0000 mg | ORAL_TABLET | Freq: Once | ORAL | Status: AC
  Administered 2021-10-17: 20 mg via ORAL
  Filled 2021-10-17: qty 5

## 2021-10-17 MED ORDER — BORTEZOMIB CHEMO SQ INJECTION 3.5 MG (2.5MG/ML)
1.3000 mg/m2 | Freq: Once | INTRAMUSCULAR | Status: AC
  Administered 2021-10-17: 2.5 mg via SUBCUTANEOUS
  Filled 2021-10-17: qty 1

## 2021-10-17 NOTE — Patient Instructions (Signed)
[facility]  Discharge Instructions: Thank you for choosing [facility] to provide your oncology and hematology care.  If you have a lab appointment with the Cancer Center, please go directly to the Cancer Center and check in at the registration area.  Wear comfortable clothing and clothing appropriate for easy access to any Portacath or PICC line.   We strive to give you quality time with your provider. You may need to reschedule your appointment if you arrive late (15 or more minutes).  Arriving late affects you and other patients whose appointments are after yours.  Also, if you miss three or more appointments without notifying the office, you may be dismissed from the clinic at the provider's discretion.      For prescription refill requests, have your pharmacy contact our office and allow 72 hours for refills to be completed.    Today you received the following chemotherapy and/or immunotherapy agents Vidaza      To help prevent nausea and vomiting after your treatment, we encourage you to take your nausea medication as directed.  BELOW ARE SYMPTOMS THAT SHOULD BE REPORTED IMMEDIATELY: *FEVER GREATER THAN 100.4 F (38 C) OR HIGHER *CHILLS OR SWEATING *NAUSEA AND VOMITING THAT IS NOT CONTROLLED WITH YOUR NAUSEA MEDICATION *UNUSUAL SHORTNESS OF BREATH *UNUSUAL BRUISING OR BLEEDING *URINARY PROBLEMS (pain or burning when urinating, or frequent urination) *BOWEL PROBLEMS (unusual diarrhea, constipation, pain near the anus) TENDERNESS IN MOUTH AND THROAT WITH OR WITHOUT PRESENCE OF ULCERS (sore throat, sores in mouth, or a toothache) UNUSUAL RASH, SWELLING OR PAIN  UNUSUAL VAGINAL DISCHARGE OR ITCHING   Items with * indicate a potential emergency and should be followed up as soon as possible or go to the Emergency Department if any problems should occur.  Please show the CHEMOTHERAPY ALERT CARD or IMMUNOTHERAPY ALERT CARD at check-in to the  Emergency Department and triage nurse.  Should you have questions after your visit or need to cancel or reschedule your appointment, please contact [facility]  [phone] and follow the prompts.  Office hours are 8:00 a.m. to 4:30 p.m. Monday - Friday. Please note that voicemails left after 4:00 p.m. may not be returned until the following business day.  We are closed weekends and major holidays. You have access to a nurse at all times for urgent questions. Please call the main number to the clinic [phone] and follow the prompts.  For any non-urgent questions, you may also contact your provider using MyChart. We now offer e-Visits for anyone 18 and older to request care online for non-urgent symptoms. For details visit [email or web address].   Also download the MyChart app! Go to the app store, search "MyChart", open the app, select [facility], and log in with your MyChart username and password.  Masks are optional in the cancer centers. If you would like for your care team to wear a mask while they are taking care of you, please let them know. For doctor visits, patients may have with them one support person who is at least 69 years old. At this time, visitors are not allowed in the infusion area.   

## 2021-10-17 NOTE — Progress Notes (Signed)
Cr 1.64. Per Pedro Earls., CMA per Dr. Janese Banks, okay to proceed with treatment.

## 2021-10-17 NOTE — Progress Notes (Signed)
Hematology/Oncology Consult note [facility]  Telephone:([phone] [phone]  Patient Care Team: Dion Body, MD as PCP - General (Family Medicine) [place], [place] (Optometry) Sindy Guadeloupe, MD as Consulting Physician (Oncology)   Name of the patient: Nicholas Weiss  [id number]  10/02/1950   Date of visit: 10/17/21  Diagnosis-high risk IgG lambda multiple myeloma R-ISS stage III  Chief complaint/ Reason for visit-on treatment assessment prior to cycle 1 day 15 of Velcade  Heme/Onc history:  Patient is a 71 year old African-American male with a past medical history significant for uncontrolled type 1 diabetes, chronic kidney disease stage III chronic normocytic anemia referred for abnormal SPEP. Patient's creatinine has been fluctuating between 1.5-2 but about 5 months ago it went up all the way to 5 and his blood sugars were in the 1000 range. More recently his kidney numbers have been drifting back to normal values. As a part of the work-up he had serum protein electrophoresis done which showed an elevated gammaglobulin fraction with an M spike of 3%. The amount of M spike was not quantified in the specimen. He has therefore been referred to [place] for further management. Patient endorses chronic fatigue reports that his appetite and weight have remained stable   Results of blood work from 04/08/2020 were as follows: CBC showed white count of 2.9, H&H of 10.5/31.2 with an MCV of 91 and a platelet count of 191.  CMP showed a mildly elevated creatinine of 1.2 which was better as compared to 5 months ago when it was 3.9.  Total protein was mildly elevated at 9.1 calcium normal at 8.4 ferritin and iron studies B12 folate TSH and haptoglobin were normal.  Myeloma panel revealed an elevated IgG level of 06/04/2004 with an M protein of 3.1 g.  Immunofixation showed IgG lambda specificity.  Serum free light chain ratio was elevated at 25 and free light  chain lambda elevated at 370   Further myeloma work-up including a PET CT scan did not reveal any evidence of edematous lesions.  Bone marrow biopsy showed 23% plasma cells by manual count and 30% by CD138 IHC.  Normal cytogenetics.  FISH studies for myeloma showed gain of 1 q.  13 q-. detected.  P53 not detected.   He was treated for a year and has having smoldering multiple myeloma since both CKD and anemia have been stable for 3 to 4 years and could be secondary to uncontrolled diabetes.In June 2023 IgG levels increased to 5463 with an M protein of 4.1 g as compared to 2.6 g in September 2022.  Serum free light chain ratio remains around 25.  Therefore a repeat bone marrow biopsy was done which shows further increase in plasma cells ranging from 30 to 70% and by immunohistochemistry 60 to 70% of the cells were positive for CD138.  Cytogenetics normal.  FISH study showed 4: 14 translocation and gain of 1 q. making this high risk RISS stage III.  Repeat PET scan showed no lytic lesions  Interval history-patient is tolerating Revlimid 10 mg well without any significant side effects.  He thinks he is taking his aspirin and acyclovir but he is not sure.  He still has 4 to 5 pills of Revlimid because he was started late on his cycle.  Denies using any cocaine  ECOG PS- 1 Pain scale- 0   Review of systems- Review of Systems  Constitutional:  Positive for malaise/fatigue. Negative for chills, fever and weight loss.  HENT:  Negative for congestion, ear discharge and nosebleeds.   Eyes:  Negative for blurred vision.  Respiratory:  Negative for cough, hemoptysis, sputum production, shortness of breath and wheezing.   Cardiovascular:  Negative for chest pain, palpitations, orthopnea and claudication.  Gastrointestinal:  Negative for abdominal pain, blood in stool, constipation, diarrhea, heartburn, melena, nausea and vomiting.  Genitourinary:  Negative for dysuria, flank pain, frequency, hematuria and  urgency.  Musculoskeletal:  Negative for back pain, joint pain and myalgias.  Skin:  Negative for rash.  Neurological:  Negative for dizziness, tingling, focal weakness, seizures, weakness and headaches.  Endo/Heme/Allergies:  Does not bruise/bleed easily.  Psychiatric/Behavioral:  Negative for depression and suicidal ideas. The patient does not have insomnia.       Allergies  Allergen Reactions   Penicillins Other (See Comments)    Has patient had a PCN reaction causing immediate rash, facial/tongue/throat swelling, SOB or lightheadedness with hypotension: No Has patient had a PCN reaction causing severe rash involving mucus membranes or skin necrosis: No Has patient had a PCN reaction that required hospitalization No Has patient had a PCN reaction occurring within the last 10 years: No If all of the above answers are "NO", then may proceed with Cephalosporin use.  Other reaction(s): Other (see comments) Other reaction(s): Other (See Comments) Has patient had a PCN reaction causing immediate rash, facial/tongue/throat swelling, SOB or lightheadedness with hypotension: No Has patient had a PCN reaction causing severe rash involving mucus membranes or skin necrosis: No Has patient had a PCN reaction that required hospitalization No Has patient had a PCN reaction occurring within the last 10 years: No If all of the above answers are "NO", then may proceed with Cephalosporin use. Has patient had a PCN reaction causing immediate rash, facial/tongue/throat swelling, SOB or lightheadedness with hypotension: No Has patient had a PCN reaction causing severe rash involving mucus membranes or skin necrosis: No Has patient had a PCN reaction that required hospitalization No Has patient had a PCN reaction occurring within the last 10 years: No If all of the above answers are "NO", then may proceed with Cephalosporin use.     Past Medical History:  Diagnosis Date   DKA (diabetic ketoacidoses)  04/06/2016   Hypercholesteremia    Hypertension      Past Surgical History:  Procedure Laterality Date   COLONOSCOPY WITH PROPOFOL N/A 02/01/2020   Procedure: COLONOSCOPY WITH PROPOFOL;  Surgeon: Lin Landsman, MD;  Location: [facility] ENDOSCOPY;  Service: Gastroenterology;  Laterality: N/A;   ESOPHAGOGASTRODUODENOSCOPY  02/01/2020   Procedure: ESOPHAGOGASTRODUODENOSCOPY (EGD);  Surgeon: Lin Landsman, MD;  Location: [facility] ENDOSCOPY;  Service: Gastroenterology;;   KNEE SURGERY Right    Torn meniscus   KNEE SURGERY Left     Social History   Socioeconomic History   Marital status: Single    Spouse name: Not on file   Number of children: 3   Years of education: Not on file   Highest education level: High school graduate  Occupational History    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  Tobacco Use   Smoking status: Never   Smokeless tobacco: Never  Vaping Use   Vaping Use: Never used  Substance and Sexual Activity   Alcohol use: Not Currently    Alcohol/week: 0.0 - 1.0 standard drinks of alcohol    Comment: "once every 2 months"   Drug use: Not Currently    Types: Marijuana    Comment: last week   Sexual activity: Yes  Birth control/protection: None  Other Topics Concern   Not on file  Social History Narrative   Lives with girlfriend "sharon"   Social Determinants of Health   Financial Resource Strain: Low Risk  (01/01/2020)   Overall Financial Resource Strain (CARDIA)    Difficulty of Paying Living Expenses: Not hard at all  Food Insecurity: No Food Insecurity (01/01/2020)   Hunger Vital Sign    Worried About Running Out of Food in the Last Year: Never true    [place] in the Last Year: Never true  Transportation Needs: No Transportation Needs (01/01/2020)   PRAPARE - Hydrologist (Medical): No    Lack of Transportation (Non-Medical): No  Physical Activity: Insufficiently Active (01/01/2020)   Exercise Vital Sign    Days of Exercise  per Week: 7 days    Minutes of Exercise per Session: 20 min  Stress: No Stress Concern Present (01/01/2020)   [place]    Feeling of Stress : Not at all  Social Connections: Moderately Isolated (01/01/2020)   Social Connection and Isolation Panel [NHANES]    Frequency of Communication with Friends and Family: More than three times a week    Frequency of Social Gatherings with Friends and Family: More than three times a week    Attends Religious Services: Never    Marine scientist or Organizations: No    Attends Archivist Meetings: Never    Marital Status: Living with partner  Intimate Partner Violence: Not At Risk (01/01/2020)   Humiliation, Afraid, Rape, and Kick questionnaire    Fear of Current or Ex-Partner: No    Emotionally Abused: No    Physically Abused: No    Sexually Abused: No    Family History  Problem Relation Age of Onset   Heart attack Father    Hypertension Sister    Cancer Sister      Current Outpatient Medications:    acyclovir (ZOVIRAX) 400 MG tablet, Take 1 tablet (400 mg total) by mouth 2 (two) times daily., Disp: 60 tablet, Rfl: 3   Alcohol Swabs (B-D SINGLE USE SWABS REGULAR) PADS, Use to check blood sugar four times daily for type 1 diabetes, Disp: 300 each, Rfl: 4   ASPIRIN 81 PO, Take 81 mg by mouth daily., Disp: , Rfl:    atorvastatin (LIPITOR) 80 MG tablet, Take 1 tablet (80 mg total) by mouth at bedtime., Disp: 30 tablet, Rfl: 2   Blood Glucose Calibration (TRUE METRIX LEVEL 1) Low SOLN, Use to check blood sugar four times daily for type 1 diabetes, Disp: 1 each, Rfl: 2   clopidogrel (PLAVIX) 75 MG tablet, Take 1 tablet (75 mg total) by mouth daily., Disp: 20 tablet, Rfl: 0   FARXIGA 10 MG TABS tablet, Take 10 mg by mouth daily., Disp: , Rfl:    gabapentin (NEURONTIN) 300 MG capsule, Take 300 mg by mouth 2 (two) times daily., Disp: , Rfl:    insulin degludec (TRESIBA  FLEXTOUCH) 100 UNIT/ML FlexTouch Pen, 15 units daily. (Patient taking differently: Inject 20 Units into the skin daily. 20 units daily.), Disp: 30 mL, Rfl: 3   Insulin Syringe-Needle U-100 (INSULIN SYRINGE 1CC/31GX5/16") 31G X 5/16" 1 ML MISC, For insulin injections up to 4 times daily, Disp: 270 each, Rfl: 4   lenalidomide (REVLIMID) 10 MG capsule, Take 1 capsule (10 mg total) by mouth daily. Take for 14 days, then hold for 7  days. Repeat every 21 days., Disp: 14 capsule, Rfl: 0   lisinopril (ZESTRIL) 2.5 MG tablet, Take 2.5 mg by mouth daily., Disp: , Rfl:    NOVOLOG 100 UNIT/ML injection, INJECT UP TO 14 UNITS THREE TIMES DAILY BEFORE MEALS ACCORDING TO SLIDING SCALE, Disp: 10 mL, Rfl: 1   ondansetron (ZOFRAN) 8 MG tablet, Take 1 tablet (8 mg total) by mouth 2 (two) times daily as needed (Nausea or vomiting)., Disp: 30 tablet, Rfl: 1   ondansetron (ZOFRAN) 4 MG tablet, Take 1 tablet (4 mg total) by mouth every 8 (eight) hours as needed for nausea or vomiting. (Patient not taking: Reported on 10/17/2021), Disp: 20 tablet, Rfl: 0   prochlorperazine (COMPAZINE) 10 MG tablet, Take 1 tablet (10 mg total) by mouth every 6 (six) hours as needed (Nausea or vomiting). (Patient not taking: Reported on 10/17/2021), Disp: 30 tablet, Rfl: 1  Physical exam:  Vitals:   10/17/21 1327  BP: (!) 153/81  Pulse: 75  Resp: 18  SpO2: 98%  Weight: 163 lb 6.4 oz (74.1 kg)   Physical Exam Constitutional:      General: He is not in acute distress. Cardiovascular:     Rate and Rhythm: Normal rate and regular rhythm.     Heart sounds: Normal heart sounds.  Pulmonary:     Effort: Pulmonary effort is normal.     Breath sounds: Normal breath sounds.  Abdominal:     General: Bowel sounds are normal.     Palpations: Abdomen is soft.  Skin:    General: Skin is warm and dry.  Neurological:     Mental Status: He is alert and oriented to person, place, and time.         Latest Ref Rng & Units 10/17/2021    1:07 PM   CMP  Glucose 70 - 99 mg/dL 83   BUN 8 - 23 mg/dL 31   Creatinine 0.61 - 1.24 mg/dL 1.64   Sodium 135 - 145 mmol/L 136   Potassium 3.5 - 5.1 mmol/L 4.6   Chloride 98 - 111 mmol/L 106   CO2 22 - 32 mmol/L 26   Calcium 8.9 - 10.3 mg/dL 8.5   Total Protein 6.5 - 8.1 g/dL 8.4   Total Bilirubin 0.3 - 1.2 mg/dL 0.6   Alkaline Phos 38 - 126 U/L 63   AST 15 - 41 U/L 32   ALT 0 - 44 U/L 40       Latest Ref Rng & Units 10/17/2021    1:07 PM  CBC  WBC 4.0 - 10.5 K/uL 4.1   Hemoglobin 13.0 - 17.0 g/dL 9.2   Hematocrit 39.0 - 52.0 % 28.7   Platelets 150 - 400 K/uL 180     No images are attached to the encounter.  NM PET Image Restag (PS) Skull Base To Thigh  Result Date: 10/11/2021 CLINICAL DATA:  Subsequent treatment strategy for multiple myeloma. EXAM: NUCLEAR MEDICINE PET SKULL BASE TO THIGH TECHNIQUE: 9.11 mCi F-18 FDG was injected intravenously. Full-ring PET imaging was performed from the skull base to thigh after the radiotracer. CT data was obtained and used for attenuation correction and anatomic localization. Fasting blood glucose: 182 mg/dl COMPARISON:  Multiple priors including PET-CT May 14, 2020 FINDINGS: Mediastinal blood pool activity: SUV max 1.84 Liver activity: SUV max NA NECK: Craniocervical misregistration artifact related to patient motion. No hypermetabolic cervical lymph nodes. Incidental CT findings: none CHEST: No hypermetabolic thoracic adenopathy. No hypermetabolic pulmonary nodules or masses. Incidental CT  findings: Aortic atherosclerosis. Coronary artery calcifications. No suspicious pulmonary nodules or masses. ABDOMEN/PELVIS: No abnormal hypermetabolic activity within the liver, pancreas, adrenal glands, or spleen. No hypermetabolic lymph nodes in the abdomen or pelvis. Incidental CT findings: Aortic and branch vessel atherosclerosis without abdominal aortic aneurysm. SKELETON: No focal hypermetabolic activity to suggest skeletal metastasis. Incidental CT  findings: Multilevel degenerative changes spine. IMPRESSION: 1. No abnormal foci of increased uptake within the skeleton to suggest metabolically active multiple myeloma. 2. No suspicious bone lesions identified on the CT portion of the exam. 3. No tracer avid lymph node or mass identified to suggest soft tissue plasmacytoma Electronically Signed   By: Dahlia Bailiff M.D.   On: 10/11/2021 10:56   CT BONE MARROW BIOPSY & ASPIRATION  Result Date: 09/23/2021 CLINICAL DATA:  Smoldering IgG lambda multiple myeloma not currently on treatment however recent increase in IgG M protein. Prior bone marrow biopsy on 04/29/2020. EXAM: CT GUIDED BONE MARROW ASPIRATION AND BIOPSY ANESTHESIA/SEDATION: Moderate (conscious) sedation was employed during this procedure. A total of Versed 1.0 mg and Fentanyl 100 mcg was administered intravenously by radiology nursing. Moderate Sedation Time: 18 minutes. The patient's level of consciousness and vital signs were monitored continuously by radiology nursing throughout the procedure under my direct supervision. PROCEDURE: The procedure risks, benefits, and alternatives were explained to the patient. Questions regarding the procedure were encouraged and answered. The patient understands and consents to the procedure. A time out was performed prior to initiating the procedure. The right gluteal region was prepped with chlorhexidine. Sterile gown and sterile gloves were used for the procedure. Local anesthesia was provided with 1% Lidocaine. Under CT guidance, an 11 gauge On Control bone cutting needle was advanced from a posterior approach into the right iliac bone. Needle positioning was confirmed with CT. Initial non heparinized and heparinized aspirate samples were obtained of bone marrow. Core biopsy was performed via the On Control drill needle. COMPLICATIONS: None FINDINGS: Inspection of initial aspirate did reveal visible particles. Intact core biopsy sample was obtained.  IMPRESSION: CT guided bone marrow biopsy of right posterior iliac bone with both aspirate and core samples obtained. Electronically Signed   By: Aletta Edouard M.D.   On: 09/23/2021 09:59     Assessment and plan- Patient is a 71 y.o. male with newly diagnosed R-ISS stage III high risk IgG lambda multiple myeloma here for on treatment assessment prior to cycle 1 day 15 of VelcadeCounts okay to proceed with cycle 1 day 15 of Velcade today.  He will directly proceed for cycle 2 of treatment in 1 week and I will see him back in 4 weeks for start of cycle 3.  He is receiving Velcade on a weekly basis at 1.3 mg per metered square.  Patient gets Decadron 20 mg once a week with each dose of Velcade.  He is currently on Revlimid 10 mg 2 weeks on and 1 week off which she will stop for this week and start new cycle next week.  If he tolerates this dose well I will increase it further to 15 mg  Continue aspirin prophylaxis as well as acyclovir.  PET CT scan did not show any evidence of active myelomatous lytic lesions.  I am therefore not starting him on bisphosphonates at this time.  Normocytic anemia: Possibly a combination of anemia of chronic kidney disease and multiple myeloma.  He gets Retacrit every 3 weeks and will receive his next dose in 1 week  Patient has upcoming appointment with [facility]  Dr. Leida Lauth next month.  I do not think that he is a candidate for transplant but if he is deemed to be a candidate and if [facility] would like me to switch him to an [place] VRD regimen I will do so after their recommendations   Visit Diagnosis 1. Multiple myeloma not having achieved remission ([place])   2. Encounter for antineoplastic chemotherapy      Dr. Randa Evens, MD, MPH [facility] [phone] 10/17/2021 2:41 PM

## 2021-10-18 DIAGNOSIS — Z5112 Encounter for antineoplastic immunotherapy: Secondary | ICD-10-CM | POA: Diagnosis not present

## 2021-10-20 ENCOUNTER — Other Ambulatory Visit: Payer: Self-pay

## 2021-10-20 ENCOUNTER — Other Ambulatory Visit: Payer: Self-pay | Admitting: *Deleted

## 2021-10-20 DIAGNOSIS — C9000 Multiple myeloma not having achieved remission: Secondary | ICD-10-CM

## 2021-10-20 MED ORDER — LENALIDOMIDE 10 MG PO CAPS: 10.0000 mg | ORAL_CAPSULE | Freq: Every day | ORAL | 0 refills | Status: DC

## 2021-10-21 ENCOUNTER — Other Ambulatory Visit: Payer: Self-pay

## 2021-10-21 DIAGNOSIS — C9000 Multiple myeloma not having achieved remission: Secondary | ICD-10-CM

## 2021-10-23 ENCOUNTER — Telehealth: Payer: Self-pay | Admitting: *Deleted

## 2021-10-23 LAB — UPEP/TP, 24-HR URINE
Albumin, U: 18 %
Alpha 1, Urine: 7.6 %
Alpha 2, Urine: 22.1 %
Beta, Urine: 26.6 %
Gamma Globulin, Urine: 25.8 %
M-Spike, mg/24 hr: 14.8 mg/24 hr — ABNORMAL HIGH
M-spike, %: 8.6 % — ABNORMAL HIGH
Total Protein, Urine-Ur/day: 172 mg/24 hr — ABNORMAL HIGH (ref 30–150)
Total Protein, Urine: 8.4 mg/dL
Total Volume: 2050

## 2021-10-23 NOTE — Telephone Encounter (Signed)
Spoke to [place] at [place] who stated patient survery was flagged due to pt not knowing that REVLIMID causes birth defects and they wanted to make sure pt was educated and aware of medication side effects. Restriction was lifted per [place] Reached out to [place] and spoke to [facility] who stated she will let the appropiate dept know that restriction was lifted in order to process the medication order.

## 2021-10-23 NOTE — Telephone Encounter (Signed)
Message from [facility] stating that patient survey has been flagged. They request we call [employer] to clear it and then notify them when it is clear [phone]

## 2021-10-24 ENCOUNTER — Inpatient Hospital Stay: Payer: Medicare Other

## 2021-10-24 ENCOUNTER — Inpatient Hospital Stay: Payer: Medicare HMO

## 2021-10-24 ENCOUNTER — Other Ambulatory Visit: Payer: Self-pay

## 2021-10-24 ENCOUNTER — Inpatient Hospital Stay: Payer: Medicare Other | Admitting: Internal Medicine

## 2021-10-24 VITALS — BP 170/89 | HR 79 | Temp 96.9°F | Resp 18 | Wt 162.6 lb

## 2021-10-24 DIAGNOSIS — C9000 Multiple myeloma not having achieved remission: Secondary | ICD-10-CM

## 2021-10-24 DIAGNOSIS — N183 Chronic kidney disease, stage 3 unspecified: Secondary | ICD-10-CM

## 2021-10-24 DIAGNOSIS — Z5112 Encounter for antineoplastic immunotherapy: Secondary | ICD-10-CM | POA: Diagnosis not present

## 2021-10-24 LAB — CBC WITH DIFFERENTIAL/PLATELET
Abs Immature Granulocytes: 0.01 10*3/uL (ref 0.00–0.07)
Basophils Absolute: 0 10*3/uL (ref 0.0–0.1)
Basophils Relative: 0 %
Eosinophils Absolute: 0.1 10*3/uL (ref 0.0–0.5)
Eosinophils Relative: 3 %
HCT: 27.7 % — ABNORMAL LOW (ref 39.0–52.0)
Hemoglobin: 9.1 g/dL — ABNORMAL LOW (ref 13.0–17.0)
Immature Granulocytes: 0 %
Lymphocytes Relative: 16 %
Lymphs Abs: 0.8 10*3/uL (ref 0.7–4.0)
MCH: 32.4 pg (ref 26.0–34.0)
MCHC: 32.9 g/dL (ref 30.0–36.0)
MCV: 98.6 fL (ref 80.0–100.0)
Monocytes Absolute: 0.5 10*3/uL (ref 0.1–1.0)
Monocytes Relative: 10 %
Neutro Abs: 3.5 10*3/uL (ref 1.7–7.7)
Neutrophils Relative %: 71 %
Platelets: 138 10*3/uL — ABNORMAL LOW (ref 150–400)
RBC: 2.81 MIL/uL — ABNORMAL LOW (ref 4.22–5.81)
RDW: 14.3 % (ref 11.5–15.5)
WBC: 4.9 10*3/uL (ref 4.0–10.5)
nRBC: 0 % (ref 0.0–0.2)

## 2021-10-24 LAB — COMPREHENSIVE METABOLIC PANEL
ALT: 39 U/L (ref 0–44)
AST: 25 U/L (ref 15–41)
Albumin: 3.4 g/dL — ABNORMAL LOW (ref 3.5–5.0)
Alkaline Phosphatase: 63 U/L (ref 38–126)
Anion gap: 4 — ABNORMAL LOW (ref 5–15)
BUN: 32 mg/dL — ABNORMAL HIGH (ref 8–23)
CO2: 25 mmol/L (ref 22–32)
Calcium: 8.2 mg/dL — ABNORMAL LOW (ref 8.9–10.3)
Chloride: 108 mmol/L (ref 98–111)
Creatinine, Ser: 1.48 mg/dL — ABNORMAL HIGH (ref 0.61–1.24)
GFR, Estimated: 51 mL/min — ABNORMAL LOW (ref >60)
Glucose, Bld: 60 mg/dL — ABNORMAL LOW (ref 70–99)
Potassium: 4.1 mmol/L (ref 3.5–5.1)
Sodium: 137 mmol/L (ref 135–145)
Total Bilirubin: 0.6 mg/dL (ref 0.3–1.2)
Total Protein: 8.2 g/dL — ABNORMAL HIGH (ref 6.5–8.1)

## 2021-10-24 MED ORDER — BORTEZOMIB CHEMO SQ INJECTION 3.5 MG (2.5MG/ML)
1.3000 mg/m2 | Freq: Once | INTRAMUSCULAR | Status: AC
  Administered 2021-10-24: 2.5 mg via SUBCUTANEOUS
  Filled 2021-10-24: qty 1

## 2021-10-24 MED ORDER — DEXAMETHASONE 4 MG PO TABS
20.0000 mg | ORAL_TABLET | Freq: Once | ORAL | Status: AC
  Administered 2021-10-24: 20 mg via ORAL
  Filled 2021-10-24: qty 5

## 2021-10-24 MED ORDER — EPOETIN ALFA-EPBX 40000 UNIT/ML IJ SOLN
40000.0000 [IU] | Freq: Once | INTRAMUSCULAR | Status: AC
  Administered 2021-10-24: 40000 [IU] via SUBCUTANEOUS
  Filled 2021-10-24: qty 1

## 2021-10-24 NOTE — Patient Instructions (Signed)
[facility] [place]  Discharge Instructions: Thank you for choosing [facility] to provide your oncology and hematology care.  If you have a lab appointment with the Cancer Center, please go directly to the Cancer Center and check in at the registration area.  Wear comfortable clothing and clothing appropriate for easy access to any Portacath or PICC line.   We strive to give you quality time with your provider. You may need to reschedule your appointment if you arrive late (15 or more minutes).  Arriving late affects you and other patients whose appointments are after yours.  Also, if you miss three or more appointments without notifying the office, you may be dismissed from the clinic at the provider's discretion.      For prescription refill requests, have your pharmacy contact our office and allow 72 hours for refills to be completed.    Today you received the following chemotherapy and/or immunotherapy agents Velcade      To help prevent nausea and vomiting after your treatment, we encourage you to take your nausea medication as directed.  BELOW ARE SYMPTOMS THAT SHOULD BE REPORTED IMMEDIATELY: *FEVER GREATER THAN 100.4 F (38 C) OR HIGHER *CHILLS OR SWEATING *NAUSEA AND VOMITING THAT IS NOT CONTROLLED WITH YOUR NAUSEA MEDICATION *UNUSUAL SHORTNESS OF BREATH *UNUSUAL BRUISING OR BLEEDING *URINARY PROBLEMS (pain or burning when urinating, or frequent urination) *BOWEL PROBLEMS (unusual diarrhea, constipation, pain near the anus) TENDERNESS IN MOUTH AND THROAT WITH OR WITHOUT PRESENCE OF ULCERS (sore throat, sores in mouth, or a toothache) UNUSUAL RASH, SWELLING OR PAIN  UNUSUAL VAGINAL DISCHARGE OR ITCHING   Items with * indicate a potential emergency and should be followed up as soon as possible or go to the Emergency Department if any problems should occur.  Please show the CHEMOTHERAPY ALERT CARD or IMMUNOTHERAPY ALERT CARD at check-in to the  Emergency Department and triage nurse.  Should you have questions after your visit or need to cancel or reschedule your appointment, please contact [facility] [place]  [phone] and follow the prompts.  Office hours are 8:00 a.m. to 4:30 p.m. Monday - Friday. Please note that voicemails left after 4:00 p.m. may not be returned until the following business day.  We are closed weekends and major holidays. You have access to a nurse at all times for urgent questions. Please call the main number to the clinic [phone] and follow the prompts.  For any non-urgent questions, you may also contact your provider using MyChart. We now offer e-Visits for anyone 18 and older to request care online for non-urgent symptoms. For details visit [email or web address].   Also download the MyChart app! Go to the app store, search "MyChart", open the app, select [facility], and log in with your MyChart username and password.  Masks are optional in the cancer centers. If you would like for your care team to wear a mask while they are taking care of you, please let them know. For doctor visits, patients may have with them one support person who is at least 69 years old. At this time, visitors are not allowed in the infusion area.   

## 2021-10-28 ENCOUNTER — Other Ambulatory Visit: Payer: Self-pay

## 2021-10-31 ENCOUNTER — Inpatient Hospital Stay: Payer: Medicare Other | Admitting: Pharmacist

## 2021-10-31 ENCOUNTER — Inpatient Hospital Stay: Payer: Medicare Other

## 2021-10-31 ENCOUNTER — Inpatient Hospital Stay: Payer: Medicare Other | Attending: Oncology | Admitting: Medical Oncology

## 2021-10-31 ENCOUNTER — Encounter: Payer: Self-pay | Admitting: Medical Oncology

## 2021-10-31 VITALS — BP 159/87 | HR 83 | Temp 98.7°F | Resp 20 | Wt 159.9 lb

## 2021-10-31 DIAGNOSIS — Z7969 Long term (current) use of other immunomodulators and immunosuppressants: Secondary | ICD-10-CM | POA: Diagnosis not present

## 2021-10-31 DIAGNOSIS — Z7982 Long term (current) use of aspirin: Secondary | ICD-10-CM | POA: Diagnosis not present

## 2021-10-31 DIAGNOSIS — Z5111 Encounter for antineoplastic chemotherapy: Secondary | ICD-10-CM | POA: Diagnosis not present

## 2021-10-31 DIAGNOSIS — Z7961 Long term (current) use of immunomodulator: Secondary | ICD-10-CM | POA: Insufficient documentation

## 2021-10-31 DIAGNOSIS — Z79624 Long term (current) use of inhibitors of nucleotide synthesis: Secondary | ICD-10-CM | POA: Diagnosis not present

## 2021-10-31 DIAGNOSIS — Z794 Long term (current) use of insulin: Secondary | ICD-10-CM | POA: Insufficient documentation

## 2021-10-31 DIAGNOSIS — D649 Anemia, unspecified: Secondary | ICD-10-CM

## 2021-10-31 DIAGNOSIS — Z7984 Long term (current) use of oral hypoglycemic drugs: Secondary | ICD-10-CM | POA: Diagnosis not present

## 2021-10-31 DIAGNOSIS — I160 Hypertensive urgency: Secondary | ICD-10-CM | POA: Insufficient documentation

## 2021-10-31 DIAGNOSIS — C9000 Multiple myeloma not having achieved remission: Secondary | ICD-10-CM

## 2021-10-31 DIAGNOSIS — R011 Cardiac murmur, unspecified: Secondary | ICD-10-CM | POA: Diagnosis not present

## 2021-10-31 DIAGNOSIS — Z7902 Long term (current) use of antithrombotics/antiplatelets: Secondary | ICD-10-CM | POA: Diagnosis not present

## 2021-10-31 DIAGNOSIS — D631 Anemia in chronic kidney disease: Secondary | ICD-10-CM | POA: Diagnosis present

## 2021-10-31 DIAGNOSIS — N183 Chronic kidney disease, stage 3 unspecified: Secondary | ICD-10-CM | POA: Diagnosis not present

## 2021-10-31 DIAGNOSIS — D708 Other neutropenia: Secondary | ICD-10-CM

## 2021-10-31 DIAGNOSIS — Z79899 Other long term (current) drug therapy: Secondary | ICD-10-CM | POA: Insufficient documentation

## 2021-10-31 DIAGNOSIS — N1832 Chronic kidney disease, stage 3b: Secondary | ICD-10-CM | POA: Insufficient documentation

## 2021-10-31 LAB — COMPREHENSIVE METABOLIC PANEL
ALT: 31 U/L (ref 0–44)
AST: 22 U/L (ref 15–41)
Albumin: 3.2 g/dL — ABNORMAL LOW (ref 3.5–5.0)
Alkaline Phosphatase: 66 U/L (ref 38–126)
Anion gap: 6 (ref 5–15)
BUN: 23 mg/dL (ref 8–23)
CO2: 26 mmol/L (ref 22–32)
Calcium: 8.1 mg/dL — ABNORMAL LOW (ref 8.9–10.3)
Chloride: 105 mmol/L (ref 98–111)
Creatinine, Ser: 1.67 mg/dL — ABNORMAL HIGH (ref 0.61–1.24)
GFR, Estimated: 44 mL/min — ABNORMAL LOW (ref >60)
Glucose, Bld: 137 mg/dL — ABNORMAL HIGH (ref 70–99)
Potassium: 4 mmol/L (ref 3.5–5.1)
Sodium: 137 mmol/L (ref 135–145)
Total Bilirubin: 0.3 mg/dL (ref 0.3–1.2)
Total Protein: 8 g/dL (ref 6.5–8.1)

## 2021-10-31 LAB — CBC WITH DIFFERENTIAL/PLATELET
Abs Immature Granulocytes: 0 10*3/uL (ref 0.00–0.07)
Basophils Absolute: 0 10*3/uL (ref 0.0–0.1)
Basophils Relative: 1 %
Eosinophils Absolute: 0.1 10*3/uL (ref 0.0–0.5)
Eosinophils Relative: 2 %
HCT: 27.6 % — ABNORMAL LOW (ref 39.0–52.0)
Hemoglobin: 8.9 g/dL — ABNORMAL LOW (ref 13.0–17.0)
Immature Granulocytes: 0 %
Lymphocytes Relative: 35 %
Lymphs Abs: 1.2 10*3/uL (ref 0.7–4.0)
MCH: 32.5 pg (ref 26.0–34.0)
MCHC: 32.2 g/dL (ref 30.0–36.0)
MCV: 100.7 fL — ABNORMAL HIGH (ref 80.0–100.0)
Monocytes Absolute: 0.6 10*3/uL (ref 0.1–1.0)
Monocytes Relative: 17 %
Neutro Abs: 1.5 10*3/uL — ABNORMAL LOW (ref 1.7–7.7)
Neutrophils Relative %: 45 %
Platelets: 244 10*3/uL (ref 150–400)
RBC: 2.74 MIL/uL — ABNORMAL LOW (ref 4.22–5.81)
RDW: 15 % (ref 11.5–15.5)
WBC: 3.3 10*3/uL — ABNORMAL LOW (ref 4.0–10.5)
nRBC: 0 % (ref 0.0–0.2)

## 2021-10-31 MED ORDER — DEXAMETHASONE 4 MG PO TABS
20.0000 mg | ORAL_TABLET | Freq: Once | ORAL | Status: AC
  Administered 2021-10-31: 20 mg via ORAL
  Filled 2021-10-31: qty 5

## 2021-10-31 MED ORDER — BORTEZOMIB CHEMO SQ INJECTION 3.5 MG (2.5MG/ML)
1.3000 mg/m2 | Freq: Once | INTRAMUSCULAR | Status: AC
  Administered 2021-10-31: 2.5 mg via SUBCUTANEOUS
  Filled 2021-10-31: qty 1

## 2021-10-31 NOTE — Progress Notes (Addendum)
Hematology/Oncology Consult note [facility]  Telephone:([phone] [phone]  Patient Care Team: Dion Body, MD as PCP - General (Family Medicine) Pa, [place] (Optometry) Sindy Guadeloupe, MD as Consulting Physician (Oncology)   Name of the patient: Nicholas Weiss  [id number]  [birth year]   Date of visit: 10/31/21  Diagnosis-high risk IgG lambda multiple myeloma R-ISS stage III  Chief complaint/ Reason for visit-on treatment assessment prior to cycle 1 day 15 of Velcade  Heme/Onc history:  Patient is a 71 year old African-American male with a past medical history significant for uncontrolled type 1 diabetes, chronic kidney disease stage III chronic normocytic anemia referred for abnormal SPEP. Patient's creatinine has been fluctuating between 1.5-2 but about 5 months ago it went up all the way to 5 and his blood sugars were in the 1000 range. More recently his kidney numbers have been drifting back to normal values. As a part of the work-up he had serum protein electrophoresis done which showed an elevated gammaglobulin fraction with an M spike of 3%. The amount of M spike was not quantified in the specimen. He has therefore been referred to [place] for further management. Patient endorses chronic fatigue reports that his appetite and weight have remained stable   Results of blood work from 04/08/2020 were as follows: CBC showed white count of 2.9, H&H of 10.5/31.2 with an MCV of 91 and a platelet count of 191.  CMP showed a mildly elevated creatinine of 1.2 which was better as compared to 5 months ago when it was 3.9.  Total protein was mildly elevated at 9.1 calcium normal at 8.4 ferritin and iron studies B12 folate TSH and haptoglobin were normal.  Myeloma panel revealed an elevated IgG level of 06/04/2004 with an M protein of 3.1 g.  Immunofixation showed IgG lambda specificity.  Serum free light chain ratio was elevated at 25 and free light  chain lambda elevated at 370   Further myeloma work-up including a PET CT scan did not reveal any evidence of edematous lesions.  Bone marrow biopsy showed 23% plasma cells by manual count and 30% by CD138 IHC.  Normal cytogenetics.  FISH studies for myeloma showed gain of 1 q.  13 q-. detected.  P53 not detected.   He was treated for a year and has having smoldering multiple myeloma since both CKD and anemia have been stable for 3 to 4 years and could be secondary to uncontrolled diabetes.In June 2023 IgG levels increased to 5463 with an M protein of 4.1 g as compared to 2.6 g in September 2022.  Serum free light chain ratio remains around 25.  Therefore a repeat bone marrow biopsy was done which shows further increase in plasma cells ranging from 30 to 70% and by immunohistochemistry 60 to 70% of the cells were positive for CD138.  Cytogenetics normal.  FISH study showed 4: 14 translocation and gain of 1 q. making this high risk RISS stage III.  Repeat PET scan showed no lytic lesions  Interval history- Patient reports that he is doing "really well". He states that he took his first month of Revlimid as directed but had some trouble getting his refill. Because of thsi has been off of this medication for 2 weeks. He asks when he should restart. No side effects. He has had some weight loss since last visit (159 today from 163) but he reports he recently had dental work completed and is still sore. He reports that he  is taking his asa and acyclovir.   ECOG PS- 1 Pain scale- 0   Review of systems- Review of Systems  Constitutional:  Negative for chills, fever, malaise/fatigue and weight loss.  HENT:  Negative for congestion, ear discharge and nosebleeds.   Eyes:  Negative for blurred vision.  Respiratory:  Negative for cough, hemoptysis, sputum production, shortness of breath and wheezing.   Cardiovascular:  Negative for chest pain, palpitations, orthopnea and claudication.  Gastrointestinal:   Negative for abdominal pain, blood in stool, constipation, diarrhea, heartburn, melena, nausea and vomiting.  Genitourinary:  Negative for dysuria, flank pain, frequency, hematuria and urgency.  Musculoskeletal:  Negative for back pain, joint pain and myalgias.  Skin:  Negative for rash.  Neurological:  Negative for dizziness, tingling, focal weakness, seizures, weakness and headaches.  Endo/Heme/Allergies:  Does not bruise/bleed easily.  Psychiatric/Behavioral:  Negative for depression and suicidal ideas. The patient does not have insomnia.       Allergies  Allergen Reactions   Penicillins Other (See Comments)    Has patient had a PCN reaction causing immediate rash, facial/tongue/throat swelling, SOB or lightheadedness with hypotension: No Has patient had a PCN reaction causing severe rash involving mucus membranes or skin necrosis: No Has patient had a PCN reaction that required hospitalization No Has patient had a PCN reaction occurring within the last 10 years: No If all of the above answers are "NO", then may proceed with Cephalosporin use.  Other reaction(s): Other (see comments) Other reaction(s): Other (See Comments) Has patient had a PCN reaction causing immediate rash, facial/tongue/throat swelling, SOB or lightheadedness with hypotension: No Has patient had a PCN reaction causing severe rash involving mucus membranes or skin necrosis: No Has patient had a PCN reaction that required hospitalization No Has patient had a PCN reaction occurring within the last 10 years: No If all of the above answers are "NO", then may proceed with Cephalosporin use. Has patient had a PCN reaction causing immediate rash, facial/tongue/throat swelling, SOB or lightheadedness with hypotension: No Has patient had a PCN reaction causing severe rash involving mucus membranes or skin necrosis: No Has patient had a PCN reaction that required hospitalization No Has patient had a PCN reaction occurring  within the last 10 years: No If all of the above answers are "NO", then may proceed with Cephalosporin use.     Past Medical History:  Diagnosis Date   DKA (diabetic ketoacidoses) 04/06/2016   Hypercholesteremia    Hypertension      Past Surgical History:  Procedure Laterality Date   COLONOSCOPY WITH PROPOFOL N/A 02/01/2020   Procedure: COLONOSCOPY WITH PROPOFOL;  Surgeon: Lin Landsman, MD;  Location[facility] ENDOSCOPY;  Service: Gastroenterology;  Laterality: N/A;   ESOPHAGOGASTRODUODENOSCOPY  02/01/2020   Procedure: ESOPHAGOGASTRODUODENOSCOPY (EGD);  Surgeon: Lin Landsman, MD;  Location: [facility] ENDOSCOPY;  Service: Gastroenterology;;   KNEE SURGERY Right    Torn meniscus   KNEE SURGERY Left     Social History   Socioeconomic History   Marital status: Single    Spouse name: Not on file   Number of children: 3   Years of education: Not on file   Highest education level: High school graduate  Occupational History    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  Tobacco Use   Smoking status: Never   Smokeless tobacco: Never  Vaping Use   Vaping Use: Never used  Substance and Sexual Activity   Alcohol use: Not Currently    Alcohol/week: 0.0 - 1.0 standard drinks  of alcohol    Comment: "once every 2 months"   Drug use: Not Currently    Types: Marijuana    Comment: last week   Sexual activity: Yes    Birth control/protection: None  Other Topics Concern   Not on file  Social History Narrative   Lives with girlfriend "sharon"   Social Determinants of Health   Financial Resource Strain: Low Risk  (01/01/2020)   Overall Financial Resource Strain (CARDIA)    Difficulty of Paying Living Expenses: Not hard at all  Food Insecurity: No Food Insecurity (01/01/2020)   Hunger Vital Sign    Worried About Running Out of Food in the Last Year: Never true    [place] in the Last Year: Never true  Transportation Needs: No Transportation Needs (01/01/2020)   PRAPARE -  Hydrologist (Medical): No    Lack of Transportation (Non-Medical): No  Physical Activity: Insufficiently Active (01/01/2020)   Exercise Vital Sign    Days of Exercise per Week: 7 days    Minutes of Exercise per Session: 20 min  Stress: No Stress Concern Present (01/01/2020)   [place]    Feeling of Stress : Not at all  Social Connections: Moderately Isolated (01/01/2020)   Social Connection and Isolation Panel [NHANES]    Frequency of Communication with Friends and Family: More than three times a week    Frequency of Social Gatherings with Friends and Family: More than three times a week    Attends Religious Services: Never    Marine scientist or Organizations: No    Attends Archivist Meetings: Never    Marital Status: Living with partner  Intimate Partner Violence: Not At Risk (01/01/2020)   Humiliation, Afraid, Rape, and Kick questionnaire    Fear of Current or Ex-Partner: No    Emotionally Abused: No    Physically Abused: No    Sexually Abused: No    Family History  Problem Relation Age of Onset   Heart attack Father    Hypertension Sister    Cancer Sister      Current Outpatient Medications:    acyclovir (ZOVIRAX) 400 MG tablet, Take 1 tablet (400 mg total) by mouth 2 (two) times daily., Disp: 60 tablet, Rfl: 3   Alcohol Swabs (B-D SINGLE USE SWABS REGULAR) PADS, Use to check blood sugar four times daily for type 1 diabetes, Disp: 300 each, Rfl: 4   ASPIRIN 81 PO, Take 81 mg by mouth daily., Disp: , Rfl:    atorvastatin (LIPITOR) 80 MG tablet, Take 1 tablet (80 mg total) by mouth at bedtime., Disp: 30 tablet, Rfl: 2   Blood Glucose Calibration (TRUE METRIX LEVEL 1) Low SOLN, Use to check blood sugar four times daily for type 1 diabetes, Disp: 1 each, Rfl: 2   clopidogrel (PLAVIX) 75 MG tablet, Take 1 tablet (75 mg total) by mouth daily., Disp: 20 tablet, Rfl: 0    FARXIGA 10 MG TABS tablet, Take 10 mg by mouth daily., Disp: , Rfl:    gabapentin (NEURONTIN) 300 MG capsule, Take 300 mg by mouth 2 (two) times daily., Disp: , Rfl:    insulin degludec (TRESIBA FLEXTOUCH) 100 UNIT/ML FlexTouch Pen, 15 units daily. (Patient taking differently: Inject 20 Units into the skin daily. 20 units daily.), Disp: 30 mL, Rfl: 3   Insulin Syringe-Needle U-100 (INSULIN SYRINGE 1CC/31GX5/16") 31G X 5/16" 1 ML MISC, For insulin  injections up to 4 times daily, Disp: 270 each, Rfl: 4   lenalidomide (REVLIMID) 10 MG capsule, Take 1 capsule (10 mg total) by mouth daily. Take for 14 days, then hold for 7 days. Repeat every 21 days., Disp: 14 capsule, Rfl: 0   lisinopril (ZESTRIL) 2.5 MG tablet, Take 2.5 mg by mouth daily., Disp: , Rfl:    NOVOLOG 100 UNIT/ML injection, INJECT UP TO 14 UNITS THREE TIMES DAILY BEFORE MEALS ACCORDING TO SLIDING SCALE, Disp: 10 mL, Rfl: 1   ondansetron (ZOFRAN) 8 MG tablet, Take 1 tablet (8 mg total) by mouth 2 (two) times daily as needed (Nausea or vomiting)., Disp: 30 tablet, Rfl: 1   ondansetron (ZOFRAN) 4 MG tablet, Take 1 tablet (4 mg total) by mouth every 8 (eight) hours as needed for nausea or vomiting. (Patient not taking: Reported on 10/17/2021), Disp: 20 tablet, Rfl: 0   prochlorperazine (COMPAZINE) 10 MG tablet, Take 1 tablet (10 mg total) by mouth every 6 (six) hours as needed (Nausea or vomiting). (Patient not taking: Reported on 10/17/2021), Disp: 30 tablet, Rfl: 1 No current facility-administered medications for this visit.  Facility-Administered Medications Ordered in Other Visits:    bortezomib SQ (VELCADE) chemo injection (2.90m/mL concentration) 2.5 mg, 1.3 mg/m2 (Treatment Plan Recorded), Subcutaneous, Once, RSindy Guadeloupe MD  Physical exam:  Vitals:   10/31/21 1320  BP: (!) 159/87  Pulse: 83  Resp: 20  Temp: 98.7 F (37.1 C)  SpO2: 100%  Weight: 159 lb 14.4 oz (72.5 kg)   Physical Exam Constitutional:      General: He is not  in acute distress. Cardiovascular:     Rate and Rhythm: Normal rate and regular rhythm.     Heart sounds: Normal heart sounds.  Pulmonary:     Effort: Pulmonary effort is normal.     Breath sounds: Normal breath sounds.  Abdominal:     General: Bowel sounds are normal.     Palpations: Abdomen is soft.  Skin:    General: Skin is warm and dry.  Neurological:     Mental Status: He is alert and oriented to person, place, and time.         Latest Ref Rng & Units 10/31/2021    1:05 PM  CMP  Glucose 70 - 99 mg/dL 137   BUN 8 - 23 mg/dL 23   Creatinine 0.61 - 1.24 mg/dL 1.67   Sodium 135 - 145 mmol/L 137   Potassium 3.5 - 5.1 mmol/L 4.0   Chloride 98 - 111 mmol/L 105   CO2 22 - 32 mmol/L 26   Calcium 8.9 - 10.3 mg/dL 8.1   Total Protein 6.5 - 8.1 g/dL 8.0   Total Bilirubin 0.3 - 1.2 mg/dL 0.3   Alkaline Phos 38 - 126 U/L 66   AST 15 - 41 U/L 22   ALT 0 - 44 U/L 31       Latest Ref Rng & Units 10/31/2021    1:05 PM  CBC  WBC 4.0 - 10.5 K/uL 3.3   Hemoglobin 13.0 - 17.0 g/dL 8.9   Hematocrit 39.0 - 52.0 % 27.6   Platelets 150 - 400 K/uL 244     No images are attached to the encounter.  NM PET Image Restag (PS) Skull Base To Thigh  Result Date: 10/11/2021 CLINICAL DATA:  Subsequent treatment strategy for multiple myeloma. EXAM: NUCLEAR MEDICINE PET SKULL BASE TO THIGH TECHNIQUE: 9.11 mCi F-18 FDG was injected intravenously. Full-ring PET imaging was performed  from the skull base to thigh after the radiotracer. CT data was obtained and used for attenuation correction and anatomic localization. Fasting blood glucose: 182 mg/dl COMPARISON:  Multiple priors including PET-CT May 14, 2020 FINDINGS: Mediastinal blood pool activity: SUV max 1.84 Liver activity: SUV max NA NECK: Craniocervical misregistration artifact related to patient motion. No hypermetabolic cervical lymph nodes. Incidental CT findings: none CHEST: No hypermetabolic thoracic adenopathy. No hypermetabolic pulmonary  nodules or masses. Incidental CT findings: Aortic atherosclerosis. Coronary artery calcifications. No suspicious pulmonary nodules or masses. ABDOMEN/PELVIS: No abnormal hypermetabolic activity within the liver, pancreas, adrenal glands, or spleen. No hypermetabolic lymph nodes in the abdomen or pelvis. Incidental CT findings: Aortic and branch vessel atherosclerosis without abdominal aortic aneurysm. SKELETON: No focal hypermetabolic activity to suggest skeletal metastasis. Incidental CT findings: Multilevel degenerative changes spine. IMPRESSION: 1. No abnormal foci of increased uptake within the skeleton to suggest metabolically active multiple myeloma. 2. No suspicious bone lesions identified on the CT portion of the exam. 3. No tracer avid lymph node or mass identified to suggest soft tissue plasmacytoma Electronically Signed   By: Dahlia Bailiff M.D.   On: 10/11/2021 10:56     Assessment and plan- Patient is a 71 y.o. male with newly diagnosed R-ISS stage III high risk IgG lambda multiple myeloma here for on treatment assessment prior to cycle 2 day 1 of Velcade.  Velcade on a weekly basis at 1.3 mg per metered square.  Patient gets Decadron 20 mg once a week with each dose of Velcade.  He is currently on Revlimid 10 mg 2 weeks on and 1 week off which he has had some difficulties with taking due to compliance and access. Discussed with Pharmacy. He will take his Revlimid starting today for 7 days. He will then STOP the Revlimid for 7 days to be back on track with his cycles. Labs reviewed and acceptable for treatment today. Velcade today. He will need to return in 1 week for labs prior to consideration of additional Velcade given labs today that are borderline. We did NOT increase his Revlimid as previously suggested due to poor adherence. This may be considered at his next visit.   PET CT scan did not show any evidence of active myelomatous lytic lesions so Dr. Janese Banks does not have him on any  bisphosphonates at this time.  Normocytic anemia: Possibly a combination of anemia of chronic kidney disease and multiple myeloma. Continue Retacrit Q3 weeks.     Visit Diagnosis 1. Multiple myeloma not having achieved remission ([place])   2. Anemia of chronic kidney failure, stage 3 (moderate) ([place])   3. Encounter for antineoplastic chemotherapy   4. Erythropoietin (EPO) stimulating agent anemia management patient   5. Other neutropenia ([place])   6. Normocytic anemia      Nelwyn Salisbury PA-C [place] [facility] 10/31/2021 1:56 PM

## 2021-10-31 NOTE — Progress Notes (Signed)
[place]  Telephone:([phone] [phone]  Patient Care Team: Dion Body, MD as PCP - General (Family Medicine) [place], [place] (Optometry) Sindy Guadeloupe, MD as Consulting Physician (Oncology)   Name of the patient: Nicholas Weiss  [id number]  1950/06/21   Date of visit: 10/31/21  HPI: Patient is a 71 y.o. male with newly diagnosed IgG lambda multiple myeloma (previously smoldering myeloma). PAtient is currently begin treated with lenalidomide, bortezomib and dexamethasone. Which started on today 10/03/21.  Reason for Consult: Medication adherence review.    PAST MEDICAL HISTORY: Past Medical History:  Diagnosis Date   DKA (diabetic ketoacidoses) 04/06/2016   Hypercholesteremia    Hypertension     HEMATOLOGY/ONCOLOGY HISTORY:  Oncology History  Multiple myeloma ([place])  09/29/2021 Initial Diagnosis   Multiple myeloma ([place])   09/29/2021 Cancer Staging   Staging form: Plasma Cell Myeloma and Plasma Cell Disorders, AJCC 8th Edition - Clinical stage from 09/29/2021: No stage assigned - Signed by Sindy Guadeloupe, MD on 09/29/2021 Stage prefix: Initial diagnosis   10/17/2021 Cancer Staging   Staging form: Plasma Cell Myeloma and Plasma Cell Disorders, AJCC 8th Edition - Clinical stage from 10/17/2021: RISS Stage III (Beta-2-microglobulin (mg/L): 5.8, Albumin (g/dL): 3.3, ISS: Stage III, High-risk cytogenetics: Present, LDH: Normal) - Signed by Sindy Guadeloupe, MD on 10/17/2021 Stage prefix: Initial diagnosis Beta 2 microglobulin range (mg/L): Greater than or equal to 5.5 Albumin range (g/dL): Less than 3.5 Cytogenetics: 1q addition, t(4;14) translocation Serum calcium level: Normal Serum creatinine level: Elevated   Multiple myeloma not having achieved remission ([place])  09/29/2021 Initial Diagnosis   Multiple myeloma not having achieved remission ([place])   10/03/2021 -  Chemotherapy   Patient is on Treatment Plan : MYELOMA  NON-TRANSPLANT CANDIDATES VRd weekly q21d       ALLERGIES:  is allergic to penicillins.  MEDICATIONS:  Current Outpatient Medications  Medication Sig Dispense Refill   acyclovir (ZOVIRAX) 400 MG tablet Take 1 tablet (400 mg total) by mouth 2 (two) times daily. 60 tablet 3   Alcohol Swabs (B-D SINGLE USE SWABS REGULAR) PADS Use to check blood sugar four times daily for type 1 diabetes 300 each 4   ASPIRIN 81 PO Take 81 mg by mouth daily.     atorvastatin (LIPITOR) 80 MG tablet Take 1 tablet (80 mg total) by mouth at bedtime. 30 tablet 2   Blood Glucose Calibration (TRUE METRIX LEVEL 1) Low SOLN Use to check blood sugar four times daily for type 1 diabetes 1 each 2   clopidogrel (PLAVIX) 75 MG tablet Take 1 tablet (75 mg total) by mouth daily. 20 tablet 0   FARXIGA 10 MG TABS tablet Take 10 mg by mouth daily.     gabapentin (NEURONTIN) 300 MG capsule Take 300 mg by mouth 2 (two) times daily.     insulin degludec (TRESIBA FLEXTOUCH) 100 UNIT/ML FlexTouch Pen 15 units daily. (Patient taking differently: Inject 20 Units into the skin daily. 20 units daily.) 30 mL 3   Insulin Syringe-Needle U-100 (INSULIN SYRINGE 1CC/31GX5/16") 31G X 5/16" 1 ML MISC For insulin injections up to 4 times daily 270 each 4   lenalidomide (REVLIMID) 10 MG capsule Take 1 capsule (10 mg total) by mouth daily. Take for 14 days, then hold for 7 days. Repeat every 21 days. 14 capsule 0   lisinopril (ZESTRIL) 2.5 MG tablet Take 2.5 mg by mouth daily.     NOVOLOG 100 UNIT/ML injection INJECT UP  TO 14 UNITS THREE TIMES DAILY BEFORE MEALS ACCORDING TO SLIDING SCALE 10 mL 1   ondansetron (ZOFRAN) 4 MG tablet Take 1 tablet (4 mg total) by mouth every 8 (eight) hours as needed for nausea or vomiting. (Patient not taking: Reported on 10/17/2021) 20 tablet 0   ondansetron (ZOFRAN) 8 MG tablet Take 1 tablet (8 mg total) by mouth 2 (two) times daily as needed (Nausea or vomiting). 30 tablet 1   prochlorperazine (COMPAZINE) 10 MG tablet  Take 1 tablet (10 mg total) by mouth every 6 (six) hours as needed (Nausea or vomiting). (Patient not taking: Reported on 10/17/2021) 30 tablet 1   No current facility-administered medications for this visit.    VITAL SIGNS: There were no vitals taken for this visit. There were no vitals filed for this visit.  Estimated body mass index is 22.62 kg/m as calculated from the following:   Height as of 09/29/21: 5' 10.5" (1.791 m).   Weight as of an earlier encounter on 10/31/21: 72.5 kg (159 lb 14.4 oz).  LABS: CBC:    Component Value Date/Time   WBC 3.3 (L) 10/31/2021 1305   HGB 8.9 (L) 10/31/2021 1305   HGB 11.2 (L) 10/03/2020 1317   HCT 27.6 (L) 10/31/2021 1305   HCT 33.1 (L) 10/03/2020 1317   PLT 244 10/31/2021 1305   PLT 202 10/03/2020 1317   MCV 100.7 (H) 10/31/2021 1305   MCV 89 10/03/2020 1317   MCV 91 10/11/2012 0542   NEUTROABS 1.5 (L) 10/31/2021 1305   NEUTROABS 11.6 (H) 10/11/2012 0542   LYMPHSABS 1.2 10/31/2021 1305   LYMPHSABS 0.7 (L) 10/11/2012 0542   MONOABS 0.6 10/31/2021 1305   MONOABS 0.3 10/11/2012 0542   EOSABS 0.1 10/31/2021 1305   EOSABS 0.0 10/11/2012 0542   BASOSABS 0.0 10/31/2021 1305   BASOSABS 0.0 10/11/2012 0542   Comprehensive Metabolic Panel:    Component Value Date/Time   NA 137 10/31/2021 1305   NA 135 10/03/2020 1317   NA 137 10/11/2012 0542   K 4.0 10/31/2021 1305   K 4.2 10/11/2012 0542   CL 105 10/31/2021 1305   CL 105 10/11/2012 0542   CO2 26 10/31/2021 1305   CO2 26 10/11/2012 0542   BUN 23 10/31/2021 1305   BUN 24 10/03/2020 1317   BUN 17 10/11/2012 0542   CREATININE 1.67 (H) 10/31/2021 1305   CREATININE 1.36 (H) 10/11/2012 0542   GLUCOSE 137 (H) 10/31/2021 1305   GLUCOSE 239 (H) 10/11/2012 0542   CALCIUM 8.1 (L) 10/31/2021 1305   CALCIUM 8.7 10/11/2012 0542   AST 22 10/31/2021 1305   ALT 31 10/31/2021 1305   ALKPHOS 66 10/31/2021 1305   BILITOT 0.3 10/31/2021 1305   BILITOT 0.4 10/03/2020 1317   PROT 8.0 10/31/2021 1305    PROT 9.1 (H) 10/03/2020 1317   ALBUMIN 3.2 (L) 10/31/2021 1305   ALBUMIN 3.3 (L) 10/03/2020 1317     Present during today's visit: patient only, seen in infusion  Assessment and Plan: Patient brought all of his home medication was him as instructed. Patient's lenalidomide was delivered last week but he has not yet resumed treatment, today is C1 D8.  I instructed him to resume his lenalidomide today and take for 7 days, followed by 7 days off. To keep his schedule IV/oral aligned He will have 10 tablets remaining to start his cycle 3 as planned on 8/18. His next refill should be written to lenalidomide 43m 4 tablets only, due to patient's current  surplus Provided patient with another medication calendar and suggested he cross of the days as he takes his medication Also provided patient with the phone number he needs to call for refills, [phone] Aspirin 42m was not among the medication with him today, He stated that he has not yet picked that up. Reminded him of the importance of this medication for the prevent of clots. I asked him to pick up some ASAP and begin taking 826mdaily.  Patient has his acyclovir and reports taking this twice daily as written     Medication Access Issues: No issues, approved for manuf assistance, patients know to call the pharmacy for refills, I marked on his calendar when he should call. Asked him to call me if he has issues getting his medication filled.   Patient expressed understanding and was in agreement with this plan. He also understands that He can call clinic at any time with any questions, concerns, or complaints.   Follow-up plan: RTC next week  Thank you for allowing me to participate in the care of this very pleasant patient.   Time Total: 20 mins  Visit consisted of counseling and education on dealing with issues of symptom management in the setting of serious and potentially life-threatening illness.Greater than 50%  of this time was  spent counseling and coordinating care related to the above assessment and plan.  Signed by: AlDarl PikesPharmD, BCPS, BCSalley SlaughterCPP Hematology/Oncology Clinical Pharmacist Practitioner [place]/DB/AP Oral ChRincon [facility] [id number]/06/2021 3:32 PM

## 2021-10-31 NOTE — Patient Instructions (Signed)
[facility]  Discharge Instructions: Thank you for choosing [facility] to provide your oncology and hematology care.  If you have a lab appointment with the Cancer Center, please go directly to the Cancer Center and check in at the registration area.  Wear comfortable clothing and clothing appropriate for easy access to any Portacath or PICC line.   We strive to give you quality time with your provider. You may need to reschedule your appointment if you arrive late (15 or more minutes).  Arriving late affects you and other patients whose appointments are after yours.  Also, if you miss three or more appointments without notifying the office, you may be dismissed from the clinic at the provider's discretion.      For prescription refill requests, have your pharmacy contact our office and allow 72 hours for refills to be completed.    Today you received the following chemotherapy and/or immunotherapy agents VELCADE      To help prevent nausea and vomiting after your treatment, we encourage you to take your nausea medication as directed.  BELOW ARE SYMPTOMS THAT SHOULD BE REPORTED IMMEDIATELY: *FEVER GREATER THAN 100.4 F (38 C) OR HIGHER *CHILLS OR SWEATING *NAUSEA AND VOMITING THAT IS NOT CONTROLLED WITH YOUR NAUSEA MEDICATION *UNUSUAL SHORTNESS OF BREATH *UNUSUAL BRUISING OR BLEEDING *URINARY PROBLEMS (pain or burning when urinating, or frequent urination) *BOWEL PROBLEMS (unusual diarrhea, constipation, pain near the anus) TENDERNESS IN MOUTH AND THROAT WITH OR WITHOUT PRESENCE OF ULCERS (sore throat, sores in mouth, or a toothache) UNUSUAL RASH, SWELLING OR PAIN  UNUSUAL VAGINAL DISCHARGE OR ITCHING   Items with * indicate a potential emergency and should be followed up as soon as possible or go to the Emergency Department if any problems should occur.  Please show the CHEMOTHERAPY ALERT CARD or IMMUNOTHERAPY ALERT CARD at check-in to the  Emergency Department and triage nurse.  Should you have questions after your visit or need to cancel or reschedule your appointment, please contact [facility]  [phone] and follow the prompts.  Office hours are 8:00 a.m. to 4:30 p.m. Monday - Friday. Please note that voicemails left after 4:00 p.m. may not be returned until the following business day.  We are closed weekends and major holidays. You have access to a nurse at all times for urgent questions. Please call the main number to the clinic [phone] and follow the prompts.  For any non-urgent questions, you may also contact your provider using MyChart. We now offer e-Visits for anyone 18 and older to request care online for non-urgent symptoms. For details visit [email or web address].   Also download the MyChart app! Go to the app store, search "MyChart", open the app, select [facility], and log in with your MyChart username and password.  Masks are optional in the cancer centers. If you would like for your care team to wear a mask while they are taking care of you, please let them know. For doctor visits, patients may have with them one support person who is at least 69 years old. At this time, visitors are not allowed in the infusion area.  Bortezomib Injection What is this medication? BORTEZOMIB (bor TEZ oh mib) treats lymphoma. It may also be used to treat multiple myeloma, a type of bone marrow cancer. It works by blocking a protein that causes cancer cells to grow and multiply. This helps to slow or stop the spread of cancer cells. This medicine may   be used for other purposes; ask your health care provider or pharmacist if you have questions. COMMON BRAND NAME(S): Velcade What should I tell my care team before I take this medication? They need to know if you have any of these conditions: Dehydration Diabetes Heart disease Liver disease Tingling of the fingers or toes or other nerve  disorder An unusual or allergic reaction to bortezomib, other medications, foods, dyes, or preservatives If you or your partner are pregnant or trying to get pregnant Breastfeeding How should I use this medication? This medication is injected into a vein or under the skin. It is given by your care team in a hospital or clinic setting. Talk to your care team about the use of this medication in children. Special care may be needed. Overdosage: If you think you have taken too much of this medicine contact a poison control center or emergency room at once. NOTE: This medicine is only for you. Do not share this medicine with others. What if I miss a dose? Keep appointments for follow-up doses. It is important not to miss your dose. Call your care team if you are unable to keep an appointment. What may interact with this medication? Ketoconazole Rifampin This list may not describe all possible interactions. Give your health care provider a list of all the medicines, herbs, non-prescription drugs, or dietary supplements you use. Also tell them if you smoke, drink alcohol, or use illegal drugs. Some items may interact with your medicine. What should I watch for while using this medication? Your condition will be monitored carefully while you are receiving this medication. You may need blood work while taking this medication. This medication may affect your coordination, reaction time, or judgment. Do not drive or operate machinery until you know how this medication affects you. Sit up or stand slowly to reduce the risk of dizzy or fainting spells. Drinking alcohol with this medication can increase the risk of these side effects. This medication may increase your risk of getting an infection. Call your care team for advice if you get a fever, chills, sore throat, or other symptoms of a cold or flu. Do not treat yourself. Try to avoid being around people who are sick. Check with your care team if you have  severe diarrhea, nausea, and vomiting, or if you sweat a lot. The loss of too much body fluid may make it dangerous for you to take this medication. Talk to your care team if you may be pregnant. Serious birth defects can occur if you take this medication during pregnancy and for 7 months after the last dose. You will need a negative pregnancy test before starting this medication. Contraception is recommended while taking this medication and for 7 months after the last dose. Your care team can help you find the option that works for you. If your partner can get pregnant, use a condom during sex while taking this medication and for 4 months after the last dose. Do not breastfeed while taking this medication and for 2 months after the last dose. This medication may cause infertility. Talk to your care team if you are concerned about your fertility. What side effects may I notice from receiving this medication? Side effects that you should report to your care team as soon as possible: Allergic reactions--skin rash, itching, hives, swelling of the face, lips, tongue, or throat Bleeding--bloody or black, tar-like stools, vomiting blood or brown material that looks like coffee grounds, red or dark brown urine, small   red or purple spots on skin, unusual bruising or bleeding Bleeding in the brain--severe headache, stiff neck, confusion, dizziness, change in vision, numbness or weakness of the face, arm, or leg, trouble speaking, trouble walking, vomiting Bowel blockage--stomach cramping, unable to have a bowel movement or pass gas, loss of appetite, vomiting Heart failure--shortness of breath, swelling of the ankles, feet, or hands, sudden weight gain, unusual weakness or fatigue Infection--fever, chills, cough, sore throat, wounds that don't heal, pain or trouble when passing urine, general feeling of discomfort or being unwell Liver injury--right upper belly pain, loss of appetite, nausea, light-colored  stool, dark yellow or brown urine, yellowing skin or eyes, unusual weakness or fatigue Low blood pressure--dizziness, feeling faint or lightheaded, blurry vision Lung injury--shortness of breath or trouble breathing, cough, spitting up blood, chest pain, fever Pain, tingling, or numbness in the hands or feet Severe or prolonged diarrhea Stomach pain, bloody diarrhea, pale skin, unusual weakness or fatigue, decrease in the amount of urine, which may be signs of hemolytic uremic syndrome Sudden and severe headache, confusion, change in vision, seizures, which may be signs of posterior reversible encephalopathy syndrome (PRES) TTP--purple spots on the skin or inside the mouth, pale skin, yellowing skin or eyes, unusual weakness or fatigue, fever, fast or irregular heartbeat, confusion, change in vision, trouble speaking, trouble walking Tumor lysis syndrome (TLS)--nausea, vomiting, diarrhea, decrease in the amount of urine, dark urine, unusual weakness or fatigue, confusion, muscle pain or cramps, fast or irregular heartbeat, joint pain Side effects that usually do not require medical attention (report to your care team if they continue or are bothersome): Constipation Diarrhea Fatigue Loss of appetite Nausea This list may not describe all possible side effects. Call your doctor for medical advice about side effects. You may report side effects to FDA at 1-800-FDA-1088. Where should I keep my medication? This medication is given in a hospital or clinic. It will not be stored at home. NOTE: This sheet is a summary. It may not cover all possible information. If you have questions about this medicine, talk to your doctor, pharmacist, or health care provider.  2023 Elsevier/Gold Standard (2021-08-13 00:00:00)   

## 2021-11-03 ENCOUNTER — Other Ambulatory Visit: Payer: Self-pay | Admitting: Oncology

## 2021-11-03 DIAGNOSIS — C9000 Multiple myeloma not having achieved remission: Secondary | ICD-10-CM

## 2021-11-06 ENCOUNTER — Other Ambulatory Visit: Payer: Self-pay | Admitting: *Deleted

## 2021-11-06 NOTE — Telephone Encounter (Signed)
Duplicate request

## 2021-11-07 ENCOUNTER — Inpatient Hospital Stay: Payer: Medicare Other

## 2021-11-07 ENCOUNTER — Encounter: Payer: Self-pay | Admitting: Medical Oncology

## 2021-11-07 ENCOUNTER — Inpatient Hospital Stay (HOSPITAL_BASED_OUTPATIENT_CLINIC_OR_DEPARTMENT_OTHER): Payer: Medicare Other | Admitting: Internal Medicine

## 2021-11-07 ENCOUNTER — Inpatient Hospital Stay (HOSPITAL_BASED_OUTPATIENT_CLINIC_OR_DEPARTMENT_OTHER): Payer: Medicare Other | Admitting: Medical Oncology

## 2021-11-07 ENCOUNTER — Other Ambulatory Visit: Payer: Self-pay

## 2021-11-07 VITALS — BP 195/81 | HR 73 | Temp 98.7°F | Resp 20 | Wt 163.1 lb

## 2021-11-07 DIAGNOSIS — C9000 Multiple myeloma not having achieved remission: Secondary | ICD-10-CM | POA: Diagnosis not present

## 2021-11-07 DIAGNOSIS — I639 Cerebral infarction, unspecified: Secondary | ICD-10-CM

## 2021-11-07 DIAGNOSIS — R011 Cardiac murmur, unspecified: Secondary | ICD-10-CM | POA: Diagnosis not present

## 2021-11-07 DIAGNOSIS — I160 Hypertensive urgency: Secondary | ICD-10-CM | POA: Diagnosis not present

## 2021-11-07 LAB — CBC WITH DIFFERENTIAL/PLATELET
Abs Immature Granulocytes: 0 10*3/uL (ref 0.00–0.07)
Basophils Absolute: 0 10*3/uL (ref 0.0–0.1)
Basophils Relative: 0 %
Eosinophils Absolute: 0.2 10*3/uL (ref 0.0–0.5)
Eosinophils Relative: 6 %
HCT: 29.1 % — ABNORMAL LOW (ref 39.0–52.0)
Hemoglobin: 9.3 g/dL — ABNORMAL LOW (ref 13.0–17.0)
Immature Granulocytes: 0 %
Lymphocytes Relative: 30 %
Lymphs Abs: 1 10*3/uL (ref 0.7–4.0)
MCH: 31.5 pg (ref 26.0–34.0)
MCHC: 32 g/dL (ref 30.0–36.0)
MCV: 98.6 fL (ref 80.0–100.0)
Monocytes Absolute: 0.2 10*3/uL (ref 0.1–1.0)
Monocytes Relative: 7 %
Neutro Abs: 2 10*3/uL (ref 1.7–7.7)
Neutrophils Relative %: 57 %
Platelets: 243 10*3/uL (ref 150–400)
RBC: 2.95 MIL/uL — ABNORMAL LOW (ref 4.22–5.81)
RDW: 14.7 % (ref 11.5–15.5)
WBC: 3.5 10*3/uL — ABNORMAL LOW (ref 4.0–10.5)
nRBC: 0 % (ref 0.0–0.2)

## 2021-11-07 LAB — COMPREHENSIVE METABOLIC PANEL
ALT: 57 U/L — ABNORMAL HIGH (ref 0–44)
AST: 46 U/L — ABNORMAL HIGH (ref 15–41)
Albumin: 3.3 g/dL — ABNORMAL LOW (ref 3.5–5.0)
Alkaline Phosphatase: 69 U/L (ref 38–126)
Anion gap: 8 (ref 5–15)
BUN: 16 mg/dL (ref 8–23)
CO2: 26 mmol/L (ref 22–32)
Calcium: 8.1 mg/dL — ABNORMAL LOW (ref 8.9–10.3)
Chloride: 104 mmol/L (ref 98–111)
Creatinine, Ser: 1.37 mg/dL — ABNORMAL HIGH (ref 0.61–1.24)
GFR, Estimated: 56 mL/min — ABNORMAL LOW (ref >60)
Glucose, Bld: 55 mg/dL — ABNORMAL LOW (ref 70–99)
Potassium: 3.9 mmol/L (ref 3.5–5.1)
Sodium: 138 mmol/L (ref 135–145)
Total Bilirubin: 0.4 mg/dL (ref 0.3–1.2)
Total Protein: 7.9 g/dL (ref 6.5–8.1)

## 2021-11-07 MED ORDER — AMLODIPINE BESYLATE 5 MG PO TABS: 5.0000 mg | ORAL_TABLET | Freq: Every day | ORAL | 0 refills | Status: DC

## 2021-11-07 MED ORDER — BACLOFEN 5 MG PO TABS: 5.0000 mg | ORAL_TABLET | Freq: Three times a day (TID) | ORAL | 1 refills | Status: DC | PRN

## 2021-11-07 NOTE — Progress Notes (Signed)
Symptom Management [place] at [facility] [phone] [phone]  Patient Care Team: Dion Body, MD as PCP - General (Family Medicine) [place], [place] (Optometry) Sindy Guadeloupe, MD as Consulting Physician (Oncology)   Name of the patient: Nicholas Weiss  [id number]  1950-10-26   Date of visit: 11/07/21  Reason for Consult: Nicholas Weiss is a 71 y.o. male who presents today for:  Elevated BP: Patient presented for treatment of Velcade today.  Found to have elevated blood pressure readings (638V systolic).  Blood pressure did not lower on recheck.  Patient referred to [facility] clinic.  Today he reports that he has not eaten yet today and it is early in the morning compared to when he normally comes in.  He states that this is likely the reason for his blood pressure elevation.  He states that he feels "great ".  He denies any headache, visual changes, chest pain, shortness of breath.  He has chronic off-and-on mild peripheral edema which is stable per patient.  He has never seen a cardiologist.  He has seen a nephrologist 1 time according to the patient.  Currently he takes lisinopril 2.5 mg daily which he tolerates well.  No recent missed dose.   Denies any neurologic complaints. Denies recent fevers or illnesses. Denies any easy bleeding or bruising. Reports good appetite and denies weight loss. Denies chest pain. Denies any nausea, vomiting, constipation, or diarrhea. Denies urinary complaints. Patient offers no further specific complaints today.    PAST MEDICAL HISTORY: Past Medical History:  Diagnosis Date   DKA (diabetic ketoacidoses) 04/06/2016   Hypercholesteremia    Hypertension     PAST SURGICAL HISTORY:  Past Surgical History:  Procedure Laterality Date   COLONOSCOPY WITH PROPOFOL N/A 02/01/2020   Procedure: COLONOSCOPY WITH PROPOFOL;  Surgeon: Lin Landsman, MD;  Location: [facility] ENDOSCOPY;  Service:  Gastroenterology;  Laterality: N/A;   ESOPHAGOGASTRODUODENOSCOPY  02/01/2020   Procedure: ESOPHAGOGASTRODUODENOSCOPY (EGD);  Surgeon: Lin Landsman, MD;  Location: [facility] ENDOSCOPY;  Service: Gastroenterology;;   KNEE SURGERY Right    Torn meniscus   KNEE SURGERY Left     HEMATOLOGY/ONCOLOGY HISTORY:  Oncology History  Multiple myeloma ([place])  09/29/2021 Initial Diagnosis   Multiple myeloma ([place])   09/29/2021 Cancer Staging   Staging form: Plasma Cell Myeloma and Plasma Cell Disorders, AJCC 8th Edition - Clinical stage from 09/29/2021: No stage assigned - Signed by Sindy Guadeloupe, MD on 09/29/2021 Stage prefix: Initial diagnosis   10/17/2021 Cancer Staging   Staging form: Plasma Cell Myeloma and Plasma Cell Disorders, AJCC 8th Edition - Clinical stage from 10/17/2021: RISS Stage III (Beta-2-microglobulin (mg/L): 5.8, Albumin (g/dL): 3.3, ISS: Stage III, High-risk cytogenetics: Present, LDH: Normal) - Signed by Sindy Guadeloupe, MD on 10/17/2021 Stage prefix: Initial diagnosis Beta 2 microglobulin range (mg/L): Greater than or equal to 5.5 Albumin range (g/dL): Less than 3.5 Cytogenetics: 1q addition, t(4;14) translocation Serum calcium level: Normal Serum creatinine level: Elevated   Multiple myeloma not having achieved remission ([place])  09/29/2021 Initial Diagnosis   Multiple myeloma not having achieved remission ([place])   10/03/2021 -  Chemotherapy   Patient is on Treatment Plan : MYELOMA NON-TRANSPLANT CANDIDATES VRd weekly q21d       ALLERGIES:  is allergic to penicillins.  MEDICATIONS:  Current Outpatient Medications  Medication Sig Dispense Refill   acyclovir (ZOVIRAX) 400 MG tablet Take 1 tablet (400 mg total) by mouth 2 (two) times daily. Sac  tablet 3   Alcohol Swabs (B-D SINGLE USE SWABS REGULAR) PADS Use to check blood sugar four times daily for type 1 diabetes 300 each 4   amLODipine (NORVASC) 5 MG tablet Take 1 tablet (5 mg total) by mouth daily. 30 tablet 0   ASPIRIN 81 PO  Take 81 mg by mouth daily.     atorvastatin (LIPITOR) 80 MG tablet Take 1 tablet (80 mg total) by mouth at bedtime. 30 tablet 2   Blood Glucose Calibration (TRUE METRIX LEVEL 1) Low SOLN Use to check blood sugar four times daily for type 1 diabetes 1 each 2   clopidogrel (PLAVIX) 75 MG tablet Take 1 tablet (75 mg total) by mouth daily. 20 tablet 0   FARXIGA 10 MG TABS tablet Take 10 mg by mouth daily.     gabapentin (NEURONTIN) 300 MG capsule Take 300 mg by mouth 2 (two) times daily.     insulin degludec (TRESIBA FLEXTOUCH) 100 UNIT/ML FlexTouch Pen 15 units daily. (Patient taking differently: Inject 20 Units into the skin daily. 20 units daily.) 30 mL 3   Insulin Syringe-Needle U-100 (INSULIN SYRINGE 1CC/31GX5/16") 31G X 5/16" 1 ML MISC For insulin injections up to 4 times daily 270 each 4   lenalidomide (REVLIMID) 10 MG capsule Take 1 capsule (10 mg total) by mouth daily. Take for 14 days, then hold for 7 days. Repeat every 21 days. 14 capsule 0   lisinopril (ZESTRIL) 2.5 MG tablet Take 2.5 mg by mouth daily.     NOVOLOG 100 UNIT/ML injection INJECT UP TO 14 UNITS THREE TIMES DAILY BEFORE MEALS ACCORDING TO SLIDING SCALE 10 mL 1   ondansetron (ZOFRAN) 8 MG tablet Take 1 tablet (8 mg total) by mouth 2 (two) times daily as needed (Nausea or vomiting). 30 tablet 1   baclofen 5 MG TABS Take 5 mg by mouth 3 (three) times daily as needed for muscle spasms. 60 tablet 1   ondansetron (ZOFRAN) 4 MG tablet Take 1 tablet (4 mg total) by mouth every 8 (eight) hours as needed for nausea or vomiting. (Patient not taking: Reported on 10/17/2021) 20 tablet 0   prochlorperazine (COMPAZINE) 10 MG tablet Take 1 tablet (10 mg total) by mouth every 6 (six) hours as needed (Nausea or vomiting). (Patient not taking: Reported on 10/17/2021) 30 tablet 1   No current facility-administered medications for this visit.    VITAL SIGNS: BP (!) 195/81   Pulse 73   Temp 98.7 F (37.1 C)   Resp 20   Wt 163 lb 1.6 oz (74 kg)    SpO2 99%   BMI 23.07 kg/m  Filed Weights   11/07/21 1025  Weight: 163 lb 1.6 oz (74 kg)    Estimated body mass index is 23.07 kg/m as calculated from the following:   Height as of 09/29/21: 5' 10.5" (1.791 m).   Weight as of this encounter: 163 lb 1.6 oz (74 kg).  LABS: CBC:    Component Value Date/Time   WBC 3.5 (L) 11/07/2021 0821   HGB 9.3 (L) 11/07/2021 0821   HGB 11.2 (L) 10/03/2020 1317   HCT 29.1 (L) 11/07/2021 0821   HCT 33.1 (L) 10/03/2020 1317   PLT 243 11/07/2021 0821   PLT 202 10/03/2020 1317   MCV 98.6 11/07/2021 0821   MCV 89 10/03/2020 1317   MCV 91 10/11/2012 0542   NEUTROABS 2.0 11/07/2021 0821   NEUTROABS 11.6 (H) 10/11/2012 0542   LYMPHSABS 1.0 11/07/2021 0821   LYMPHSABS 0.7 (  L) 10/11/2012 0542   MONOABS 0.2 11/07/2021 0821   MONOABS 0.3 10/11/2012 0542   EOSABS 0.2 11/07/2021 0821   EOSABS 0.0 10/11/2012 0542   BASOSABS 0.0 11/07/2021 0821   BASOSABS 0.0 10/11/2012 0542   Comprehensive Metabolic Panel:    Component Value Date/Time   NA 138 11/07/2021 0821   NA 135 10/03/2020 1317   NA 137 10/11/2012 0542   K 3.9 11/07/2021 0821   K 4.2 10/11/2012 0542   CL 104 11/07/2021 0821   CL 105 10/11/2012 0542   CO2 26 11/07/2021 0821   CO2 26 10/11/2012 0542   BUN 16 11/07/2021 0821   BUN 24 10/03/2020 1317   BUN 17 10/11/2012 0542   CREATININE 1.37 (H) 11/07/2021 0821   CREATININE 1.36 (H) 10/11/2012 0542   GLUCOSE 55 (L) 11/07/2021 0821   GLUCOSE 239 (H) 10/11/2012 0542   CALCIUM 8.1 (L) 11/07/2021 0821   CALCIUM 8.7 10/11/2012 0542   AST 46 (H) 11/07/2021 0821   ALT 57 (H) 11/07/2021 0821   ALKPHOS 69 11/07/2021 0821   BILITOT 0.4 11/07/2021 0821   BILITOT 0.4 10/03/2020 1317   PROT 7.9 11/07/2021 0821   PROT 9.1 (H) 10/03/2020 1317   ALBUMIN 3.3 (L) 11/07/2021 0821   ALBUMIN 3.3 (L) 10/03/2020 1317    RADIOGRAPHIC STUDIES: NM PET Image Restag (PS) Skull Base To Thigh  Result Date: 10/11/2021 CLINICAL DATA:  Subsequent treatment  strategy for multiple myeloma. EXAM: NUCLEAR MEDICINE PET SKULL BASE TO THIGH TECHNIQUE: 9.11 mCi F-18 FDG was injected intravenously. Full-ring PET imaging was performed from the skull base to thigh after the radiotracer. CT data was obtained and used for attenuation correction and anatomic localization. Fasting blood glucose: 182 mg/dl COMPARISON:  Multiple priors including PET-CT May 14, 2020 FINDINGS: Mediastinal blood pool activity: SUV max 1.84 Liver activity: SUV max NA NECK: Craniocervical misregistration artifact related to patient motion. No hypermetabolic cervical lymph nodes. Incidental CT findings: none CHEST: No hypermetabolic thoracic adenopathy. No hypermetabolic pulmonary nodules or masses. Incidental CT findings: Aortic atherosclerosis. Coronary artery calcifications. No suspicious pulmonary nodules or masses. ABDOMEN/PELVIS: No abnormal hypermetabolic activity within the liver, pancreas, adrenal glands, or spleen. No hypermetabolic lymph nodes in the abdomen or pelvis. Incidental CT findings: Aortic and branch vessel atherosclerosis without abdominal aortic aneurysm. SKELETON: No focal hypermetabolic activity to suggest skeletal metastasis. Incidental CT findings: Multilevel degenerative changes spine. IMPRESSION: 1. No abnormal foci of increased uptake within the skeleton to suggest metabolically active multiple myeloma. 2. No suspicious bone lesions identified on the CT portion of the exam. 3. No tracer avid lymph node or mass identified to suggest soft tissue plasmacytoma Electronically Signed   By: Dahlia Bailiff M.D.   On: 10/11/2021 10:56    PERFORMANCE STATUS (ECOG) : 0 - Asymptomatic  Review of Systems Unless otherwise noted, a complete review of systems is negative.  Physical Exam General: NAD Cardiovascular: regular rate and rhythm, Soft diastolic murmur. Mild non-pitting peripheral edema of bilateral lower legs.  Pulmonary: clear ant fields Abdomen: soft, nontender, +  bowel sounds GU: no suprapubic tenderness Extremities:  no joint deformities Skin: no rashes Neurological: Weakness but otherwise nonfocal  Assessment and Plan- Patient is a 71 y.o. male    Encounter Diagnoses  Name Primary?   Hypertensive urgency Yes   Newly recognized heart murmur    New. New murmur. EKG today shows normal sinus rhythm with 1st degree AV block and possible left atrial enlargement. Labs are fairly stable with exception  of mild elevation of LFTs- likely treatment related and not likely secondary to end organ failure. Referring to cardiology for further evaluation and starting him on amlodipine in addition to his lisinopril given his CKD. He will see his PCP within the week, stay hydrated with water, follow a low salt diet and monitor BP at home- has machine. Discussed red flag signs and symptoms. No Velcade today as this can elevated BP further.    Patient expressed understanding and was in agreement with this plan. He also understands that He can call clinic at any time with any questions, concerns, or complaints.   Thank you for allowing me to participate in the care of this very pleasant patient.   Time Total: 25  Visit consisted of counseling and education dealing with the complex and emotionally intense issues of symptom management in the setting of serious illness.Greater than 50%  of this time was spent counseling and coordinating care related to the above assessment and plan.  Signed by: Nelwyn Salisbury, PA-C

## 2021-11-07 NOTE — Progress Notes (Signed)
[facility] [place], [place] [address] [phone]   New Patient Evaluation  Date of Service: 11/07/21 Patient Name: Nicholas Weiss Patient MRN: [id number] Patient DOB: 26-Sep-1950 Provider: Ventura Sellers, MD  Identifying Statement:  Nicholas Weiss is a 71 y.o. male with Cerebrovascular accident (CVA), unspecified mechanism ([place]) who presents for initial consultation and evaluation regarding cancer associated neurologic deficits.    Referring Provider: Dion Body, MD [facility] [place],  [place] [address]  Primary Cancer:  Oncologic History: Oncology History  Multiple myeloma ([place])  09/29/2021 Initial Diagnosis   Multiple myeloma ([place])   09/29/2021 Cancer Staging   Staging form: Plasma Cell Myeloma and Plasma Cell Disorders, AJCC 8th Edition - Clinical stage from 09/29/2021: No stage assigned - Signed by Sindy Guadeloupe, MD on 09/29/2021 Stage prefix: Initial diagnosis   10/17/2021 Cancer Staging   Staging form: Plasma Cell Myeloma and Plasma Cell Disorders, AJCC 8th Edition - Clinical stage from 10/17/2021: RISS Stage III (Beta-2-microglobulin (mg/L): 5.8, Albumin (g/dL): 3.3, ISS: Stage III, High-risk cytogenetics: Present, LDH: Normal) - Signed by Sindy Guadeloupe, MD on 10/17/2021 Stage prefix: Initial diagnosis Beta 2 microglobulin range (mg/L): Greater than or equal to 5.5 Albumin range (g/dL): Less than 3.5 Cytogenetics: 1q addition, t(4;14) translocation Serum calcium level: Normal Serum creatinine level: Elevated   Multiple myeloma not having achieved remission ([place])  09/29/2021 Initial Diagnosis   Multiple myeloma not having achieved remission ([place])   10/03/2021 -  Chemotherapy   Patient is on Treatment Plan : MYELOMA NON-TRANSPLANT CANDIDATES VRd weekly q21d       History of Present Illness: The patient's records from the referring physician were obtained and reviewed and the  patient interviewed to confirm this HPI.  Nicholas Weiss presents today for follow up after recent stroke admission.  On May 13th of this year, he experienced impairment or loss of consciousness, fall at home.  He returned to normal within minutes, no residual weakness or numbness.  CNS imaging demonstrated left subcortical infarct.  Following complete workup, he was discharged to home.  Currently he denies any focal symptoms, but does describe twitching or flinging of his hands, arms, shoulders at times.  This has been ongoing for more than a year.    Medications: Current Outpatient Medications on File Prior to Visit  Medication Sig Dispense Refill   acyclovir (ZOVIRAX) 400 MG tablet Take 1 tablet (400 mg total) by mouth 2 (two) times daily. 60 tablet 3   Alcohol Swabs (B-D SINGLE USE SWABS REGULAR) PADS Use to check blood sugar four times daily for type 1 diabetes 300 each 4   ASPIRIN 81 PO Take 81 mg by mouth daily.     atorvastatin (LIPITOR) 80 MG tablet Take 1 tablet (80 mg total) by mouth at bedtime. 30 tablet 2   Blood Glucose Calibration (TRUE METRIX LEVEL 1) Low SOLN Use to check blood sugar four times daily for type 1 diabetes 1 each 2   clopidogrel (PLAVIX) 75 MG tablet Take 1 tablet (75 mg total) by mouth daily. 20 tablet 0   FARXIGA 10 MG TABS tablet Take 10 mg by mouth daily.     gabapentin (NEURONTIN) 300 MG capsule Take 300 mg by mouth 2 (two) times daily.     insulin degludec (TRESIBA FLEXTOUCH) 100 UNIT/ML FlexTouch Pen 15 units daily. (Patient taking differently: Inject 20 Units into the skin daily. 20 units daily.)  30 mL 3   Insulin Syringe-Needle U-100 (INSULIN SYRINGE 1CC/31GX5/16") 31G X 5/16" 1 ML MISC For insulin injections up to 4 times daily 270 each 4   lenalidomide (REVLIMID) 10 MG capsule Take 1 capsule (10 mg total) by mouth daily. Take for 14 days, then hold for 7 days. Repeat every 21 days. 14 capsule 0   lisinopril (ZESTRIL) 2.5 MG tablet Take 2.5 mg by mouth daily.      NOVOLOG 100 UNIT/ML injection INJECT UP TO 14 UNITS THREE TIMES DAILY BEFORE MEALS ACCORDING TO SLIDING SCALE 10 mL 1   ondansetron (ZOFRAN) 4 MG tablet Take 1 tablet (4 mg total) by mouth every 8 (eight) hours as needed for nausea or vomiting. (Patient not taking: Reported on 10/17/2021) 20 tablet 0   ondansetron (ZOFRAN) 8 MG tablet Take 1 tablet (8 mg total) by mouth 2 (two) times daily as needed (Nausea or vomiting). 30 tablet 1   prochlorperazine (COMPAZINE) 10 MG tablet Take 1 tablet (10 mg total) by mouth every 6 (six) hours as needed (Nausea or vomiting). (Patient not taking: Reported on 10/17/2021) 30 tablet 1   No current facility-administered medications on file prior to visit.    Allergies:  Allergies  Allergen Reactions   Penicillins Other (See Comments)    Has patient had a PCN reaction causing immediate rash, facial/tongue/throat swelling, SOB or lightheadedness with hypotension: No Has patient had a PCN reaction causing severe rash involving mucus membranes or skin necrosis: No Has patient had a PCN reaction that required hospitalization No Has patient had a PCN reaction occurring within the last 10 years: No If all of the above answers are "NO", then may proceed with Cephalosporin use.  Other reaction(s): Other (see comments) Other reaction(s): Other (See Comments) Has patient had a PCN reaction causing immediate rash, facial/tongue/throat swelling, SOB or lightheadedness with hypotension: No Has patient had a PCN reaction causing severe rash involving mucus membranes or skin necrosis: No Has patient had a PCN reaction that required hospitalization No Has patient had a PCN reaction occurring within the last 10 years: No If all of the above answers are "NO", then may proceed with Cephalosporin use. Has patient had a PCN reaction causing immediate rash, facial/tongue/throat swelling, SOB or lightheadedness with hypotension: No Has patient had a PCN reaction causing severe  rash involving mucus membranes or skin necrosis: No Has patient had a PCN reaction that required hospitalization No Has patient had a PCN reaction occurring within the last 10 years: No If all of the above answers are "NO", then may proceed with Cephalosporin use.   Past Medical History:  Past Medical History:  Diagnosis Date   DKA (diabetic ketoacidoses) 04/06/2016   Hypercholesteremia    Hypertension    Past Surgical History:  Past Surgical History:  Procedure Laterality Date   COLONOSCOPY WITH PROPOFOL N/A 02/01/2020   Procedure: COLONOSCOPY WITH PROPOFOL;  Surgeon: Lin Landsman, MD;  Location: [facility] ENDOSCOPY;  Service: Gastroenterology;  Laterality: N/A;   ESOPHAGOGASTRODUODENOSCOPY  02/01/2020   Procedure: ESOPHAGOGASTRODUODENOSCOPY (EGD);  Surgeon: Lin Landsman, MD;  Location: [facility] ENDOSCOPY;  Service: Gastroenterology;;   KNEE SURGERY Right    Torn meniscus   KNEE SURGERY Left    Social History:  Social History   Socioeconomic History   Marital status: Single    Spouse name: Not on file   Number of children: 3   Years of education: Not on file   Highest education level: High school graduate  Occupational History  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  Tobacco Use   Smoking status: Never   Smokeless tobacco: Never  Vaping Use   Vaping Use: Never used  Substance and Sexual Activity   Alcohol use: Not Currently    Alcohol/week: 0.0 - 1.0 standard drinks of alcohol    Comment: "once every 2 months"   Drug use: Not Currently    Types: Marijuana    Comment: last week   Sexual activity: Yes    Birth control/protection: None  Other Topics Concern   Not on file  Social History Narrative   Lives with girlfriend "sharon"   Social Determinants of Health   Financial Resource Strain: Low Risk  (01/01/2020)   Overall Financial Resource Strain (CARDIA)    Difficulty of Paying Living Expenses: Not hard at all  Food Insecurity: No Food Insecurity (01/01/2020)    Hunger Vital Sign    Worried About Running Out of Food in the Last Year: Never true    Ran Out of Food in the Last Year: Never true  Transportation Needs: No Transportation Needs (01/01/2020)   PRAPARE - Hydrologist (Medical): No    Lack of Transportation (Non-Medical): No  Physical Activity: Insufficiently Active (01/01/2020)   Exercise Vital Sign    Days of Exercise per Week: 7 days    Minutes of Exercise per Session: 20 min  Stress: No Stress Concern Present (01/01/2020)   [place]    Feeling of Stress : Not at all  Social Connections: Moderately Isolated (01/01/2020)   Social Connection and Isolation Panel [NHANES]    Frequency of Communication with Friends and Family: More than three times a week    Frequency of Social Gatherings with Friends and Family: More than three times a week    Attends Religious Services: Never    Marine scientist or Organizations: No    Attends Archivist Meetings: Never    Marital Status: Living with partner  Intimate Partner Violence: Not At Risk (01/01/2020)   Humiliation, Afraid, Rape, and Kick questionnaire    Fear of Current or Ex-Partner: No    Emotionally Abused: No    Physically Abused: No    Sexually Abused: No   Family History:  Family History  Problem Relation Age of Onset   Heart attack Father    Hypertension Sister    Cancer Sister     Review of Systems: Constitutional: Doesn't report fevers, chills or abnormal weight loss Eyes: Doesn't report blurriness of vision Ears, nose, mouth, throat, and face: Doesn't report sore throat Respiratory: Doesn't report cough, dyspnea or wheezes Cardiovascular: Doesn't report palpitation, chest discomfort  Gastrointestinal:  Doesn't report nausea, constipation, diarrhea GU: Doesn't report incontinence Skin: Doesn't report skin rashes Neurological: Per HPI Musculoskeletal: Doesn't  report joint pain Behavioral/Psych: Doesn't report anxiety  Physical Exam:    11/07/2021   10:25 AM 11/07/2021    9:46 AM 11/07/2021    9:30 AM  Vitals with BMI  Weight 163 lbs 2 oz    Systolic 622 633 354  Diastolic 81 93 95  Pulse 73 73 74    KPS: 90. General: Alert, cooperative, pleasant, in no acute distress Head: Normal EENT: No conjunctival injection or scleral icterus.  Lungs: Resp effort normal Cardiac: Regular rate Abdomen: Non-distended abdomen Skin: No rashes cyanosis or petechiae. Extremities: No clubbing or edema  Neurologic Exam: Mental Status: Awake, alert, attentive to examiner. Oriented to self  and environment. Language is fluent with intact comprehension.  Cranial Nerves: Visual acuity is grossly normal. Visual fields are full. Extra-ocular movements intact. No ptosis. Face is symmetric Motor: Tone and bulk are normal. Power is full in both arms and legs.  Noted asterixis with posturing. Reflexes are symmetric, no pathologic reflexes present.  Sensory: Intact to light touch Gait: Normal.   Labs: I have reviewed the data as listed    Component Value Date/Time   NA 138 11/07/2021 0821   NA 135 10/03/2020 1317   NA 137 10/11/2012 0542   K 3.9 11/07/2021 0821   K 4.2 10/11/2012 0542   CL 104 11/07/2021 0821   CL 105 10/11/2012 0542   CO2 26 11/07/2021 0821   CO2 26 10/11/2012 0542   GLUCOSE 55 (L) 11/07/2021 0821   GLUCOSE 239 (H) 10/11/2012 0542   BUN 16 11/07/2021 0821   BUN 24 10/03/2020 1317   BUN 17 10/11/2012 0542   CREATININE 1.37 (H) 11/07/2021 0821   CREATININE 1.36 (H) 10/11/2012 0542   CALCIUM 8.1 (L) 11/07/2021 0821   CALCIUM 8.7 10/11/2012 0542   PROT 7.9 11/07/2021 0821   PROT 9.1 (H) 10/03/2020 1317   ALBUMIN 3.3 (L) 11/07/2021 0821   ALBUMIN 3.3 (L) 10/03/2020 1317   AST 46 (H) 11/07/2021 0821   ALT 57 (H) 11/07/2021 0821   ALKPHOS 69 11/07/2021 0821   BILITOT 0.4 11/07/2021 0821   BILITOT 0.4 10/03/2020 1317   GFRNONAA 56 (L)  11/07/2021 0821   GFRNONAA 57 (L) 10/11/2012 0542   GFRAA 54 (L) 03/15/2020 1056   GFRAA >60 10/11/2012 0542   Lab Results  Component Value Date   WBC 3.5 (L) 11/07/2021   NEUTROABS 2.0 11/07/2021   HGB 9.3 (L) 11/07/2021   HCT 29.1 (L) 11/07/2021   MCV 98.6 11/07/2021   PLT 243 11/07/2021    Imaging:  NM PET Image Restag (PS) Skull Base To Thigh  Result Date: 10/11/2021 CLINICAL DATA:  Subsequent treatment strategy for multiple myeloma. EXAM: NUCLEAR MEDICINE PET SKULL BASE TO THIGH TECHNIQUE: 9.11 mCi F-18 FDG was injected intravenously. Full-ring PET imaging was performed from the skull base to thigh after the radiotracer. CT data was obtained and used for attenuation correction and anatomic localization. Fasting blood glucose: 182 mg/dl COMPARISON:  Multiple priors including PET-CT May 14, 2020 FINDINGS: Mediastinal blood pool activity: SUV max 1.84 Liver activity: SUV max NA NECK: Craniocervical misregistration artifact related to patient motion. No hypermetabolic cervical lymph nodes. Incidental CT findings: none CHEST: No hypermetabolic thoracic adenopathy. No hypermetabolic pulmonary nodules or masses. Incidental CT findings: Aortic atherosclerosis. Coronary artery calcifications. No suspicious pulmonary nodules or masses. ABDOMEN/PELVIS: No abnormal hypermetabolic activity within the liver, pancreas, adrenal glands, or spleen. No hypermetabolic lymph nodes in the abdomen or pelvis. Incidental CT findings: Aortic and branch vessel atherosclerosis without abdominal aortic aneurysm. SKELETON: No focal hypermetabolic activity to suggest skeletal metastasis. Incidental CT findings: Multilevel degenerative changes spine. IMPRESSION: 1. No abnormal foci of increased uptake within the skeleton to suggest metabolically active multiple myeloma. 2. No suspicious bone lesions identified on the CT portion of the exam. 3. No tracer avid lymph node or mass identified to suggest soft tissue  plasmacytoma Electronically Signed   By: Dahlia Bailiff M.D.   On: 10/11/2021 10:56     Assessment/Plan Cerebrovascular accident (CVA), unspecified mechanism ([place])  Nicholas Weiss presents with clinical and radiographic syndrome consistent with cerebral infarction.  Etiology is suspected large vessel thrombosis, atherosclerosis.  He completed 21 days course of dual anti-platelet therapy, and has been compliant with high dose statin and 4m aspirin.  His blood pressure remains elevated today, he will be started on amlodipine in addition to his low dose lisinopril.  Hypertension will be managed further by his PCP, visit later this month.  His involuntary movements are most consistent with asterixis and myoclonus.  This is likely secondary to renal dysfunction, plus cancer/chemo and recent stroke as exacerbating features.  We discussed and recommended a trial of low dose PRN baclofen for periods of myoclonus which interfere with day to day functioning.  We spent twenty additional minutes teaching regarding the natural history, biology, and historical experience in the treatment of neurologic complications of cancer.   We appreciate the opportunity to participate in the care of Nicholas Weiss   We ask that Nicholas HARNOISreturn to clinic in 3 months, or sooner as needed.  All questions were answered. The patient knows to call the clinic with any problems, questions or concerns. No barriers to learning were detected.  The total time spent in the encounter was 40 minutes and more than 50% was on counseling and review of test results   ZVentura Sellers MD Medical Director of Neuro-Oncology [facility]08/11/23 10:44 AM

## 2021-11-07 NOTE — Progress Notes (Unsigned)
Hold treatment today per Nelwyn Salisbury, PA due to patients blood pressure.

## 2021-11-10 ENCOUNTER — Other Ambulatory Visit: Payer: Self-pay | Admitting: *Deleted

## 2021-11-10 NOTE — Telephone Encounter (Signed)
This is a duplicate request

## 2021-11-12 ENCOUNTER — Other Ambulatory Visit: Payer: Self-pay

## 2021-11-14 ENCOUNTER — Inpatient Hospital Stay: Payer: Medicare Other

## 2021-11-14 ENCOUNTER — Inpatient Hospital Stay: Payer: Medicare HMO

## 2021-11-14 VITALS — BP 144/85 | HR 75 | Temp 98.1°F | Resp 18

## 2021-11-14 DIAGNOSIS — C9000 Multiple myeloma not having achieved remission: Secondary | ICD-10-CM

## 2021-11-14 DIAGNOSIS — N183 Chronic kidney disease, stage 3 unspecified: Secondary | ICD-10-CM

## 2021-11-14 LAB — COMPREHENSIVE METABOLIC PANEL
ALT: 53 U/L — ABNORMAL HIGH (ref 0–44)
AST: 51 U/L — ABNORMAL HIGH (ref 15–41)
Albumin: 3.2 g/dL — ABNORMAL LOW (ref 3.5–5.0)
Alkaline Phosphatase: 66 U/L (ref 38–126)
Anion gap: 5 (ref 5–15)
BUN: 22 mg/dL (ref 8–23)
CO2: 25 mmol/L (ref 22–32)
Calcium: 8 mg/dL — ABNORMAL LOW (ref 8.9–10.3)
Chloride: 107 mmol/L (ref 98–111)
Creatinine, Ser: 1.38 mg/dL — ABNORMAL HIGH (ref 0.61–1.24)
GFR, Estimated: 56 mL/min — ABNORMAL LOW (ref >60)
Glucose, Bld: 97 mg/dL (ref 70–99)
Potassium: 3.9 mmol/L (ref 3.5–5.1)
Sodium: 137 mmol/L (ref 135–145)
Total Bilirubin: 0.6 mg/dL (ref 0.3–1.2)
Total Protein: 7.5 g/dL (ref 6.5–8.1)

## 2021-11-14 LAB — CBC WITH DIFFERENTIAL/PLATELET
Abs Immature Granulocytes: 0.01 10*3/uL (ref 0.00–0.07)
Basophils Absolute: 0 10*3/uL (ref 0.0–0.1)
Basophils Relative: 1 %
Eosinophils Absolute: 0.1 10*3/uL (ref 0.0–0.5)
Eosinophils Relative: 2 %
HCT: 25.2 % — ABNORMAL LOW (ref 39.0–52.0)
Hemoglobin: 8.1 g/dL — ABNORMAL LOW (ref 13.0–17.0)
Immature Granulocytes: 0 %
Lymphocytes Relative: 23 %
Lymphs Abs: 0.9 10*3/uL (ref 0.7–4.0)
MCH: 31.5 pg (ref 26.0–34.0)
MCHC: 32.1 g/dL (ref 30.0–36.0)
MCV: 98.1 fL (ref 80.0–100.0)
Monocytes Absolute: 0.4 10*3/uL (ref 0.1–1.0)
Monocytes Relative: 11 %
Neutro Abs: 2.3 10*3/uL (ref 1.7–7.7)
Neutrophils Relative %: 63 %
Platelets: 222 10*3/uL (ref 150–400)
RBC: 2.57 MIL/uL — ABNORMAL LOW (ref 4.22–5.81)
RDW: 14.6 % (ref 11.5–15.5)
WBC: 3.6 10*3/uL — ABNORMAL LOW (ref 4.0–10.5)
nRBC: 0 % (ref 0.0–0.2)

## 2021-11-14 MED ORDER — BORTEZOMIB CHEMO SQ INJECTION 3.5 MG (2.5MG/ML)
1.3000 mg/m2 | Freq: Once | INTRAMUSCULAR | Status: AC
  Administered 2021-11-14: 2.5 mg via SUBCUTANEOUS
  Filled 2021-11-14: qty 1

## 2021-11-14 MED ORDER — DEXAMETHASONE 4 MG PO TABS
20.0000 mg | ORAL_TABLET | Freq: Once | ORAL | Status: AC
  Administered 2021-11-14: 20 mg via ORAL
  Filled 2021-11-14: qty 5

## 2021-11-14 MED ORDER — EPOETIN ALFA-EPBX 40000 UNIT/ML IJ SOLN
40000.0000 [IU] | Freq: Once | INTRAMUSCULAR | Status: AC
  Administered 2021-11-14: 40000 [IU] via SUBCUTANEOUS
  Filled 2021-11-14: qty 1

## 2021-11-14 NOTE — Patient Instructions (Signed)
[facility] AT [place]  Discharge Instructions: Thank you for choosing [facility] to provide your oncology and hematology care.  If you have a lab appointment with the [facility], please go directly to the [facility] and check in at the registration area.  Wear comfortable clothing and clothing appropriate for easy access to any Portacath or PICC line.   We strive to give you quality time with your provider. You may need to reschedule your appointment if you arrive late (15 or more minutes).  Arriving late affects you and other patients whose appointments are after yours.  Also, if you miss three or more appointments without notifying the office, you may be dismissed from the clinic at the provider's discretion.      For prescription refill requests, have your pharmacy contact our office and allow 72 hours for refills to be completed.    Today you received the following chemotherapy and/or immunotherapy agents Velcade & Retacrit      To help prevent nausea and vomiting after your treatment, we encourage you to take your nausea medication as directed.  BELOW ARE SYMPTOMS THAT SHOULD BE REPORTED IMMEDIATELY: *FEVER GREATER THAN 100.4 F (38 C) OR HIGHER *CHILLS OR SWEATING *NAUSEA AND VOMITING THAT IS NOT CONTROLLED WITH YOUR NAUSEA MEDICATION *UNUSUAL SHORTNESS OF BREATH *UNUSUAL BRUISING OR BLEEDING *URINARY PROBLEMS (pain or burning when urinating, or frequent urination) *BOWEL PROBLEMS (unusual diarrhea, constipation, pain near the anus) TENDERNESS IN MOUTH AND THROAT WITH OR WITHOUT PRESENCE OF ULCERS (sore throat, sores in mouth, or a toothache) UNUSUAL RASH, SWELLING OR PAIN  UNUSUAL VAGINAL DISCHARGE OR ITCHING   Items with * indicate a potential emergency and should be followed up as soon as possible or go to the Emergency Department if any problems should occur.  Please show the CHEMOTHERAPY ALERT CARD or IMMUNOTHERAPY ALERT CARD at  check-in to the Emergency Department and triage nurse.  Should you have questions after your visit or need to cancel or reschedule your appointment, please contact [facility] [place] AT [place]  [phone] and follow the prompts.  Office hours are 8:00 a.m. to 4:30 p.m. Monday - Friday. Please note that voicemails left after 4:00 p.m. may not be returned until the following business day.  We are closed weekends and major holidays. You have access to a nurse at all times for urgent questions. Please call the main number to the clinic [phone] and follow the prompts.  For any non-urgent questions, you may also contact your provider using MyChart. We now offer e-Visits for anyone 33 and older to request care online for non-urgent symptoms. For details visit [email or web address].   Also download the MyChart app! Go to the app store, search "MyChart", open the app, select [facility], and log in with your MyChart username and password.  Masks are optional in the cancer centers. If you would like for your care team to wear a mask while they are taking care of you, please let them know. For doctor visits, patients may have with them one support person who is at least 69 years old. At this time, visitors are not allowed in the infusion area.

## 2021-11-17 LAB — KAPPA/LAMBDA LIGHT CHAINS
Kappa free light chain: 12.8 mg/L (ref 3.3–19.4)
Kappa, lambda light chain ratio: 0.08 — ABNORMAL LOW (ref 0.26–1.65)
Lambda free light chains: 167.1 mg/L — ABNORMAL HIGH (ref 5.7–26.3)

## 2021-11-20 ENCOUNTER — Telehealth: Payer: Self-pay | Admitting: *Deleted

## 2021-11-20 LAB — MULTIPLE MYELOMA PANEL, SERUM
Albumin SerPl Elph-Mcnc: 3.2 g/dL (ref 2.9–4.4)
Albumin/Glob SerPl: 0.9 (ref 0.7–1.7)
Alpha 1: 0.2 g/dL (ref 0.0–0.4)
Alpha2 Glob SerPl Elph-Mcnc: 0.6 g/dL (ref 0.4–1.0)
B-Globulin SerPl Elph-Mcnc: 0.6 g/dL — ABNORMAL LOW (ref 0.7–1.3)
Gamma Glob SerPl Elph-Mcnc: 2.4 g/dL — ABNORMAL HIGH (ref 0.4–1.8)
Globulin, Total: 3.9 g/dL (ref 2.2–3.9)
IgA: 16 mg/dL — ABNORMAL LOW (ref 61–437)
IgG (Immunoglobin G), Serum: 2901 mg/dL — ABNORMAL HIGH (ref 603–1613)
IgM (Immunoglobulin M), Srm: <5 mg/dL — ABNORMAL LOW (ref 20–172)
M Protein SerPl Elph-Mcnc: 2.3 g/dL — ABNORMAL HIGH
Total Protein ELP: 7.1 g/dL (ref 6.0–8.5)

## 2021-11-20 NOTE — Telephone Encounter (Signed)
Got a call from [facility] about the pt's second opinion and pt had an appt and he called and told [facility] to cancel it , he was not wanting it anymore.

## 2021-11-21 ENCOUNTER — Inpatient Hospital Stay: Payer: Medicare Other

## 2021-11-21 ENCOUNTER — Other Ambulatory Visit: Payer: Self-pay

## 2021-11-21 ENCOUNTER — Encounter: Payer: Self-pay | Admitting: Oncology

## 2021-11-21 ENCOUNTER — Ambulatory Visit
Admission: RE | Admit: 2021-11-21 | Discharge: 2021-11-21 | Disposition: A | Discharge status: Home / Self Care | Payer: Medicare Other | Source: Ambulatory Visit | Attending: Oncology | Admitting: Oncology

## 2021-11-21 ENCOUNTER — Inpatient Hospital Stay (HOSPITAL_BASED_OUTPATIENT_CLINIC_OR_DEPARTMENT_OTHER): Payer: Medicare Other | Admitting: Oncology

## 2021-11-21 VITALS — BP 142/71 | HR 73 | Temp 95.0°F | Resp 18 | Wt 164.0 lb

## 2021-11-21 DIAGNOSIS — M7989 Other specified soft tissue disorders: Secondary | ICD-10-CM | POA: Insufficient documentation

## 2021-11-21 DIAGNOSIS — M79661 Pain in right lower leg: Secondary | ICD-10-CM | POA: Insufficient documentation

## 2021-11-21 DIAGNOSIS — Z5111 Encounter for antineoplastic chemotherapy: Secondary | ICD-10-CM

## 2021-11-21 DIAGNOSIS — Z79899 Other long term (current) drug therapy: Secondary | ICD-10-CM

## 2021-11-21 DIAGNOSIS — C9000 Multiple myeloma not having achieved remission: Secondary | ICD-10-CM

## 2021-11-21 DIAGNOSIS — D708 Other neutropenia: Secondary | ICD-10-CM

## 2021-11-21 LAB — COMPREHENSIVE METABOLIC PANEL
ALT: 31 U/L (ref 0–44)
AST: 19 U/L (ref 15–41)
Albumin: 3.1 g/dL — ABNORMAL LOW (ref 3.5–5.0)
Alkaline Phosphatase: 57 U/L (ref 38–126)
Anion gap: 6 (ref 5–15)
BUN: 20 mg/dL (ref 8–23)
CO2: 26 mmol/L (ref 22–32)
Calcium: 8 mg/dL — ABNORMAL LOW (ref 8.9–10.3)
Chloride: 105 mmol/L (ref 98–111)
Creatinine, Ser: 1.46 mg/dL — ABNORMAL HIGH (ref 0.61–1.24)
GFR, Estimated: 52 mL/min — ABNORMAL LOW (ref >60)
Glucose, Bld: 138 mg/dL — ABNORMAL HIGH (ref 70–99)
Potassium: 3.8 mmol/L (ref 3.5–5.1)
Sodium: 137 mmol/L (ref 135–145)
Total Bilirubin: 0.8 mg/dL (ref 0.3–1.2)
Total Protein: 7.2 g/dL (ref 6.5–8.1)

## 2021-11-21 LAB — CBC WITH DIFFERENTIAL/PLATELET
Abs Immature Granulocytes: 0 10*3/uL (ref 0.00–0.07)
Basophils Absolute: 0 10*3/uL (ref 0.0–0.1)
Basophils Relative: 0 %
Eosinophils Absolute: 0.4 10*3/uL (ref 0.0–0.5)
Eosinophils Relative: 12 %
HCT: 25.4 % — ABNORMAL LOW (ref 39.0–52.0)
Hemoglobin: 8.2 g/dL — ABNORMAL LOW (ref 13.0–17.0)
Immature Granulocytes: 0 %
Lymphocytes Relative: 25 %
Lymphs Abs: 0.8 10*3/uL (ref 0.7–4.0)
MCH: 31.8 pg (ref 26.0–34.0)
MCHC: 32.3 g/dL (ref 30.0–36.0)
MCV: 98.4 fL (ref 80.0–100.0)
Monocytes Absolute: 0.5 10*3/uL (ref 0.1–1.0)
Monocytes Relative: 16 %
Neutro Abs: 1.4 10*3/uL — ABNORMAL LOW (ref 1.7–7.7)
Neutrophils Relative %: 47 %
Platelets: 164 10*3/uL (ref 150–400)
RBC: 2.58 MIL/uL — ABNORMAL LOW (ref 4.22–5.81)
RDW: 15.2 % (ref 11.5–15.5)
WBC: 3.1 10*3/uL — ABNORMAL LOW (ref 4.0–10.5)
nRBC: 0 % (ref 0.0–0.2)

## 2021-11-21 MED ORDER — DEXAMETHASONE 4 MG PO TABS
40.0000 mg | ORAL_TABLET | Freq: Once | ORAL | Status: AC
  Administered 2021-11-21: 40 mg via ORAL
  Filled 2021-11-21: qty 10

## 2021-11-21 MED ORDER — ACYCLOVIR 400 MG PO TABS: 400.0000 mg | ORAL_TABLET | Freq: Two times a day (BID) | ORAL | 3 refills | Status: DC

## 2021-11-21 MED ORDER — BORTEZOMIB CHEMO SQ INJECTION 3.5 MG (2.5MG/ML)
1.3000 mg/m2 | Freq: Once | INTRAMUSCULAR | Status: AC
  Administered 2021-11-21: 2.5 mg via SUBCUTANEOUS
  Filled 2021-11-21: qty 1

## 2021-11-21 NOTE — Progress Notes (Signed)
Patient states that his medications have been making him feel dizzy/unsteady and he has a cold, and his right leg is swollen and painful (7/10)

## 2021-11-21 NOTE — Patient Instructions (Signed)
Continue taking Acyclovir per Dr. Janese Banks. RX sent to pharmacy.

## 2021-11-21 NOTE — Progress Notes (Signed)
ON PATHWAY REGIMEN - Multiple Myeloma and Other Plasma Cell Dyscrasias  No Change  Continue With Treatment as Ordered.  Original Decision Date/Time: 09/29/2021 12:32     A cycle is every 21 days:     Bortezomib      Lenalidomide      Dexamethasone   **Always confirm dose/schedule in your pharmacy ordering system**  Patient Characteristics: Multiple Myeloma, Newly Diagnosed, Transplant Ineligible or Refused, Unknown or Awaiting Test Results Disease Classification: Multiple Myeloma R-ISS Staging: Unknown Therapeutic Status: Newly Diagnosed Is Patient Eligible for Transplant<= Transplant Ineligible or Refused Risk Status: Unknown Intent of Therapy: Non-Curative / Palliative Intent, Discussed with Patient

## 2021-11-22 ENCOUNTER — Other Ambulatory Visit: Payer: Self-pay | Admitting: Oncology

## 2021-11-22 DIAGNOSIS — C9000 Multiple myeloma not having achieved remission: Secondary | ICD-10-CM

## 2021-11-26 ENCOUNTER — Inpatient Hospital Stay
Admission: EM | Admit: 2021-11-26 | Discharge: 2021-11-28 | DRG: 637 | Disposition: A | Discharge status: Home Health | Payer: Medicare Other | Attending: Hospitalist | Admitting: Hospitalist

## 2021-11-26 ENCOUNTER — Encounter: Payer: Self-pay | Admitting: Oncology

## 2021-11-26 ENCOUNTER — Emergency Department: Payer: Medicare Other

## 2021-11-26 ENCOUNTER — Other Ambulatory Visit: Payer: Self-pay

## 2021-11-26 DIAGNOSIS — F14129 Cocaine abuse with intoxication, unspecified: Secondary | ICD-10-CM | POA: Diagnosis present

## 2021-11-26 DIAGNOSIS — Z7902 Long term (current) use of antithrombotics/antiplatelets: Secondary | ICD-10-CM

## 2021-11-26 DIAGNOSIS — E1040 Type 1 diabetes mellitus with diabetic neuropathy, unspecified: Secondary | ICD-10-CM | POA: Diagnosis present

## 2021-11-26 DIAGNOSIS — E1010 Type 1 diabetes mellitus with ketoacidosis without coma: Principal | ICD-10-CM | POA: Diagnosis present

## 2021-11-26 DIAGNOSIS — E875 Hyperkalemia: Secondary | ICD-10-CM | POA: Diagnosis present

## 2021-11-26 DIAGNOSIS — R739 Hyperglycemia, unspecified: Secondary | ICD-10-CM

## 2021-11-26 DIAGNOSIS — U071 COVID-19: Secondary | ICD-10-CM | POA: Diagnosis present

## 2021-11-26 DIAGNOSIS — N1831 Chronic kidney disease, stage 3a: Secondary | ICD-10-CM | POA: Diagnosis present

## 2021-11-26 DIAGNOSIS — D631 Anemia in chronic kidney disease: Secondary | ICD-10-CM | POA: Diagnosis present

## 2021-11-26 DIAGNOSIS — E1110 Type 2 diabetes mellitus with ketoacidosis without coma: Secondary | ICD-10-CM | POA: Diagnosis present

## 2021-11-26 DIAGNOSIS — Z8249 Family history of ischemic heart disease and other diseases of the circulatory system: Secondary | ICD-10-CM

## 2021-11-26 DIAGNOSIS — Z91199 Patient's noncompliance with other medical treatment and regimen due to unspecified reason: Secondary | ICD-10-CM

## 2021-11-26 DIAGNOSIS — I129 Hypertensive chronic kidney disease with stage 1 through stage 4 chronic kidney disease, or unspecified chronic kidney disease: Secondary | ICD-10-CM | POA: Diagnosis present

## 2021-11-26 DIAGNOSIS — C9000 Multiple myeloma not having achieved remission: Secondary | ICD-10-CM | POA: Diagnosis present

## 2021-11-26 DIAGNOSIS — R531 Weakness: Principal | ICD-10-CM

## 2021-11-26 DIAGNOSIS — E1022 Type 1 diabetes mellitus with diabetic chronic kidney disease: Secondary | ICD-10-CM | POA: Diagnosis present

## 2021-11-26 DIAGNOSIS — Z880 Allergy status to penicillin: Secondary | ICD-10-CM

## 2021-11-26 DIAGNOSIS — N179 Acute kidney failure, unspecified: Secondary | ICD-10-CM | POA: Diagnosis present

## 2021-11-26 DIAGNOSIS — Z794 Long term (current) use of insulin: Secondary | ICD-10-CM

## 2021-11-26 DIAGNOSIS — I440 Atrioventricular block, first degree: Secondary | ICD-10-CM | POA: Diagnosis present

## 2021-11-26 DIAGNOSIS — I4891 Unspecified atrial fibrillation: Secondary | ICD-10-CM

## 2021-11-26 DIAGNOSIS — E7800 Pure hypercholesterolemia, unspecified: Secondary | ICD-10-CM | POA: Diagnosis present

## 2021-11-26 DIAGNOSIS — Z8673 Personal history of transient ischemic attack (TIA), and cerebral infarction without residual deficits: Secondary | ICD-10-CM

## 2021-11-26 DIAGNOSIS — Z79899 Other long term (current) drug therapy: Secondary | ICD-10-CM

## 2021-11-26 DIAGNOSIS — R000 Tachycardia, unspecified: Secondary | ICD-10-CM | POA: Diagnosis present

## 2021-11-26 DIAGNOSIS — Z7982 Long term (current) use of aspirin: Secondary | ICD-10-CM

## 2021-11-26 LAB — BLOOD GAS, VENOUS
Acid-base deficit: 15.2 mmol/L — ABNORMAL HIGH (ref 0.0–2.0)
Bicarbonate: 12.5 mmol/L — ABNORMAL LOW (ref 20.0–28.0)
O2 Saturation: 53.2 %
Patient temperature: 37
pCO2, Ven: 35 mmHg — ABNORMAL LOW (ref 44–60)
pH, Ven: 7.16 — CL (ref 7.25–7.43)
pO2, Ven: 40 mmHg (ref 32–45)

## 2021-11-26 LAB — CBC WITH DIFFERENTIAL/PLATELET
Abs Immature Granulocytes: 0.02 10*3/uL (ref 0.00–0.07)
Basophils Absolute: 0 10*3/uL (ref 0.0–0.1)
Basophils Relative: 0 %
Eosinophils Absolute: 0 10*3/uL (ref 0.0–0.5)
Eosinophils Relative: 0 %
HCT: 36.9 % — ABNORMAL LOW (ref 39.0–52.0)
Hemoglobin: 11.1 g/dL — ABNORMAL LOW (ref 13.0–17.0)
Immature Granulocytes: 0 %
Lymphocytes Relative: 9 %
Lymphs Abs: 0.7 10*3/uL (ref 0.7–4.0)
MCH: 30.7 pg (ref 26.0–34.0)
MCHC: 30.1 g/dL (ref 30.0–36.0)
MCV: 101.9 fL — ABNORMAL HIGH (ref 80.0–100.0)
Monocytes Absolute: 0.4 10*3/uL (ref 0.1–1.0)
Monocytes Relative: 6 %
Neutro Abs: 6.6 10*3/uL (ref 1.7–7.7)
Neutrophils Relative %: 85 %
Platelets: 257 10*3/uL (ref 150–400)
RBC: 3.62 MIL/uL — ABNORMAL LOW (ref 4.22–5.81)
RDW: 15 % (ref 11.5–15.5)
WBC: 7.8 10*3/uL (ref 4.0–10.5)
nRBC: 0 % (ref 0.0–0.2)

## 2021-11-26 LAB — CBG MONITORING, ED: Glucose-Capillary: >600 mg/dL (ref 70–99)

## 2021-11-26 MED ORDER — DILTIAZEM HCL 25 MG/5ML IV SOLN
10.0000 mg | Freq: Once | INTRAVENOUS | Status: AC
  Administered 2021-11-26: 10 mg via INTRAVENOUS
  Filled 2021-11-26: qty 5

## 2021-11-26 MED ORDER — SODIUM CHLORIDE 0.9 % IV BOLUS
1000.0000 mL | Freq: Once | INTRAVENOUS | Status: AC
  Administered 2021-11-26: 1000 mL via INTRAVENOUS

## 2021-11-26 NOTE — ED Provider Notes (Signed)
[facility] Provider Note    Event Date/Time   First MD Initiated Contact with Patient 11/26/21 2310     (approximate)   History   Hyperglycemia and Weakness   HPI  Nicholas Weiss is a 71 y.o. male brought to the ED via EMS from home with a chief complaint of hyperglycemia.  Patient with a history of DKA, hypertension, hyperlipidemia, multiple myeloma who has had increased blood sugars x2 days; has not taking his medication today due to lack of appetite.  Endorses generalized weakness for the past 2 weeks.  Endorses nausea without vomiting.  Denies fever, cough, chest pain, shortness of breath, abdominal pain, dysuria or diarrhea.     Past Medical History   Past Medical History:  Diagnosis Date   DKA (diabetic ketoacidoses) 04/06/2016   Hypercholesteremia    Hypertension      Active Problem List   Patient Active Problem List   Diagnosis Date Noted   Stroke (cerebrum) ([place]) 11/07/2021   Multiple myeloma ([place]) 09/29/2021   Multiple myeloma not having achieved remission ([place]) 09/29/2021   Chronic hyponatremia 08/18/2021   COVID-19 virus detected 08/13/2021   History of CVA (cerebrovascular accident) 08/13/2021   Syncope and collapse 08/09/2021   Generalized abdominal pain 08/05/2021   Hepatic lesion 08/05/2021   Dyslipidemia 05/08/2021   History of substance abuse ([place]) 71/08/2692   Acute metabolic encephalopathy 85/46/2703   Cocaine abuse ([place]) 11/30/2020   Anemia of chronic disease 11/08/2020   Muscle twitching 09/12/2020   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   Pre-syncope 08/01/2020   Heart block AV first degree 08/01/2020   Anemia of chronic kidney failure, stage 3 (moderate) ([place]) 05/28/2020   PUD (peptic ulcer disease)    Esophageal dysphagia    Encounter for screening colonoscopy    Generalized weakness 07/28/2019   Hypoglycemia 04/27/2019   Unresponsiveness 04/27/2019   Lung nodule 04/07/2019   Uncontrolled type 1 diabetes  mellitus    Acute renal failure with acute tubular necrosis superimposed on stage 3a chronic kidney disease ([place])    Hypertensive urgency 04/05/2019   AKI (acute kidney injury) ([place]) 04/05/2019   CKD (chronic kidney disease) stage 3, GFR 30-59 ml/min ([place]) 04/05/2019   DKA (diabetic ketoacidoses) 04/06/2016   Hypercholesterolemia 01/24/2016   Essential (primary) hypertension 06/07/2006   Type 1 diabetes mellitus with diabetic neuropathy, unspecified ([place]) 05/31/2006     Past Surgical History   Past Surgical History:  Procedure Laterality Date   COLONOSCOPY WITH PROPOFOL N/A 02/01/2020   Procedure: COLONOSCOPY WITH PROPOFOL;  Surgeon: Lin Landsman, MD;  Location: [facility] ENDOSCOPY;  Service: Gastroenterology;  Laterality: N/A;   ESOPHAGOGASTRODUODENOSCOPY  02/01/2020   Procedure: ESOPHAGOGASTRODUODENOSCOPY (EGD);  Surgeon: Lin Landsman, MD;  Location: [facility] ENDOSCOPY;  Service: Gastroenterology;;   KNEE SURGERY Right    Torn meniscus   KNEE SURGERY Left      Home Medications   Prior to Admission medications   Medication Sig Start Date End Date Taking? Authorizing Provider  acyclovir (ZOVIRAX) 400 MG tablet Take 1 tablet (400 mg total) by mouth 2 (two) times daily. 11/21/21   Sindy Guadeloupe, MD  Alcohol Swabs (B-D SINGLE USE SWABS REGULAR) PADS Use to check blood sugar four times daily for type 1 diabetes 07/11/20   Birdie Sons, MD  amLODipine (NORVASC) 5 MG tablet Take 1 tablet (5 mg total) by mouth daily. 11/07/21   Hughie Closs, PA-C  ASPIRIN 81 PO Take 81 mg by mouth daily.  [provider]  atorvastatin (LIPITOR) 80 MG tablet Take 1 tablet (80 mg total) by mouth at bedtime. 08/11/21   Shawna Clamp, MD  baclofen 5 MG TABS Take 5 mg by mouth 3 (three) times daily as needed for muscle spasms. 11/07/21   Ventura Sellers, MD  Blood Glucose Calibration (TRUE METRIX LEVEL 1) Low SOLN Use to check blood sugar four times daily for type 1 diabetes 07/11/20    Birdie Sons, MD  clopidogrel (PLAVIX) 75 MG tablet Take 1 tablet (75 mg total) by mouth daily. 08/12/21   Shawna Clamp, MD  FARXIGA 10 MG TABS tablet Take 10 mg by mouth daily. 05/05/21   [provider]  gabapentin (NEURONTIN) 300 MG capsule Take 300 mg by mouth 2 (two) times daily. 04/04/21   [provider]  insulin degludec (TRESIBA FLEXTOUCH) 100 UNIT/ML FlexTouch Pen 15 units daily. Patient taking differently: Inject 20 Units into the skin daily. 20 units daily. 12/04/20   Aline August, MD  Insulin Syringe-Needle U-100 (INSULIN SYRINGE 1CC/31GX5/16") 31G X 5/16" 1 ML MISC For insulin injections up to 4 times daily 07/11/20   Birdie Sons, MD  lenalidomide (REVLIMID) 10 MG capsule TAKE 1 CAPSULE BY MOUTH EVERY DAY FOR 14 DAYS THEN HOLD FOR 7 DAYS. REPEAT EVERY 21 DAYS. 11/26/21   Sindy Guadeloupe, MD  lisinopril (ZESTRIL) 2.5 MG tablet Take 2.5 mg by mouth daily. 11/08/20 11/08/21  [provider]  NOVOLOG 100 UNIT/ML injection INJECT UP TO 14 UNITS THREE TIMES DAILY BEFORE MEALS ACCORDING TO SLIDING SCALE 09/09/21   Birdie Sons, MD  ondansetron (ZOFRAN) 4 MG tablet Take 1 tablet (4 mg total) by mouth every 8 (eight) hours as needed for nausea or vomiting. Patient not taking: Reported on 10/17/2021 05/07/21   Merlyn Lot, MD  prochlorperazine (COMPAZINE) 10 MG tablet Take 1 tablet (10 mg total) by mouth every 6 (six) hours as needed (Nausea or vomiting). Patient not taking: Reported on 10/17/2021 09/29/21 11/21/21  Sindy Guadeloupe, MD     Allergies  Penicillins   Family History   Family History  Problem Relation Age of Onset   Heart attack Father    Hypertension Sister    Cancer Sister      Physical Exam  Triage Vital Signs: ED Triage Vitals  Enc Vitals Group     BP      Pulse      Resp      Temp      Temp src      SpO2      Weight      Height      Head Circumference      Peak Flow      Pain Score      Pain Loc      Pain Edu?       Excl. in [place]?     Updated Vital Signs: BP 138/73   Pulse 97   Temp (!) 97.2 F (36.2 C) (Oral)   Resp 18   SpO2 99%    General: Awake, mild distress.  Appears weak.  Dry mucous membranes. CV:  Tachycardic, irregularly irregular rhythm.  Good peripheral perfusion.  Resp:  Normal effort.  CTA B. Abd:  Nontender to light or deep palpation.  No distention.  Other:  No truncal vesicles.  Alert and oriented x3.  CN II toXII grossly intact.  5/5 motor strength and sensation all extremities. MAEx4.    ED Results / Procedures /  Treatments  Labs (all labs ordered are listed, but only abnormal results are displayed) Labs Reviewed  CULTURE, BLOOD (ROUTINE X 2)  CULTURE, BLOOD (ROUTINE X 2)  SARS CORONAVIRUS 2 BY RT PCR  CBC WITH DIFFERENTIAL/PLATELET  COMPREHENSIVE METABOLIC PANEL  LIPASE, BLOOD  URINALYSIS, ROUTINE W REFLEX MICROSCOPIC  LACTIC ACID, PLASMA  LACTIC ACID, PLASMA  BLOOD GAS, VENOUS  PROCALCITONIN  BETA-HYDROXYBUTYRIC ACID  ETHANOL  URINE DRUG SCREEN, QUALITATIVE ([facility] ONLY)  TROPONIN I (HIGH SENSITIVITY)     EKG  ED ECG REPORT I, Nuvia Hileman J, the attending physician, personally viewed and interpreted this ECG.   Date: 11/26/2021  EKG Time: 2317  Rate: 115  Rhythm: atrial fibrillation, rate 115  Axis: Normal  Intervals: Peaked T waves  ST&T Change: QTC 523 Compared to EKG dated 11/07/2021, new onset A-fib, peaked T waves and prolonged QTc   RADIOLOGY I have independently visualized and interpreted patient's chest x-ray as well as noted the radiology interpretation:  X-ray:  Official radiology report(s): No results found.   PROCEDURES:  Critical Care performed: Yes, see critical care procedure note(s)  CRITICAL CARE Performed by: Paulette Blanch   Total critical care time: *** minutes  Critical care time was exclusive of separately billable procedures and treating other patients.  Critical care was necessary to treat or prevent imminent or  life-threatening deterioration.  Critical care was time spent personally by me on the following activities: development of treatment plan with patient and/or surrogate as well as nursing, discussions with consultants, evaluation of patient's response to treatment, examination of patient, obtaining history from patient or surrogate, ordering and performing treatments and interventions, ordering and review of laboratory studies, ordering and review of radiographic studies, pulse oximetry and re-evaluation of patient's condition.   Marland Kitchen1-3 Lead EKG Interpretation  Performed by: Paulette Blanch, MD Authorized by: Paulette Blanch, MD     Interpretation: abnormal     ECG rate:  115   ECG rate assessment: tachycardic     Rhythm: atrial fibrillation     Ectopy: none     Conduction: normal   Comments:     Patient placed on cardiac monitor to evaluate for arrhythmias    MEDICATIONS ORDERED IN ED: Medications  sodium chloride 0.9 % bolus 1,000 mL (has no administration in time range)     IMPRESSION / MDM / ASSESSMENT AND PLAN / ED COURSE  I reviewed the triage vital signs and the nursing notes.                             71 year old male with type 1 diabetes presenting with hyperglycemia, generalized weakness and new onset atrial fibrillation.  Differential diagnosis includes but is not limited to ACS, metabolic, infectious etiologies, etc.  I have personally reviewed patient's records and note an oncology office visit for multiple myeloma on 11/21/2021.  Patient's presentation is most consistent with acute presentation with potential threat to life or bodily function.  The patient is on the cardiac monitor to evaluate for evidence of arrhythmia and/or significant heart rate changes.  We will obtain sepsis work-up, VBG, beta hydroxybutyrate.  Initiate IV fluid resuscitation.  Anticipate hospitalization.      FINAL CLINICAL IMPRESSION(S) / ED DIAGNOSES   Final diagnoses:  Generalized weakness   Hyperglycemia  Atrial fibrillation, unspecified type ([place])     Rx / DC Orders   ED Discharge Orders     None  Note:  This document was prepared using Dragon voice recognition software and may include unintentional dictation errors. 

## 2021-11-26 NOTE — ED Triage Notes (Signed)
Hyperglycemia x 2 days with weakness x2 wks. Hx DM and bone cancer. Bgl >600 with EMS. Denies taking DM medicines today and reports decreased oral intake x2 days. Reports nausea. IV placed with EMS en route. 300 mL NaCl given en route.

## 2021-11-26 NOTE — ED Notes (Signed)
MD notified pt is in Afib with HR variable from ~90-143. New orders placed.

## 2021-11-27 ENCOUNTER — Other Ambulatory Visit: Payer: Self-pay

## 2021-11-27 ENCOUNTER — Inpatient Hospital Stay
Admit: 2021-11-27 | Discharge: 2021-11-27 | Disposition: A | Discharge status: Home / Self Care | Payer: Medicare Other | Attending: Pulmonary Disease | Admitting: Pulmonary Disease

## 2021-11-27 DIAGNOSIS — E875 Hyperkalemia: Secondary | ICD-10-CM | POA: Diagnosis present

## 2021-11-27 DIAGNOSIS — Z8673 Personal history of transient ischemic attack (TIA), and cerebral infarction without residual deficits: Secondary | ICD-10-CM | POA: Diagnosis not present

## 2021-11-27 DIAGNOSIS — F14129 Cocaine abuse with intoxication, unspecified: Secondary | ICD-10-CM | POA: Diagnosis present

## 2021-11-27 DIAGNOSIS — Z7902 Long term (current) use of antithrombotics/antiplatelets: Secondary | ICD-10-CM | POA: Diagnosis not present

## 2021-11-27 DIAGNOSIS — E1010 Type 1 diabetes mellitus with ketoacidosis without coma: Principal | ICD-10-CM

## 2021-11-27 DIAGNOSIS — D631 Anemia in chronic kidney disease: Secondary | ICD-10-CM | POA: Diagnosis present

## 2021-11-27 DIAGNOSIS — U071 COVID-19: Secondary | ICD-10-CM | POA: Diagnosis present

## 2021-11-27 DIAGNOSIS — I129 Hypertensive chronic kidney disease with stage 1 through stage 4 chronic kidney disease, or unspecified chronic kidney disease: Secondary | ICD-10-CM | POA: Diagnosis present

## 2021-11-27 DIAGNOSIS — E1110 Type 2 diabetes mellitus with ketoacidosis without coma: Secondary | ICD-10-CM | POA: Diagnosis present

## 2021-11-27 DIAGNOSIS — Z91199 Patient's noncompliance with other medical treatment and regimen due to unspecified reason: Secondary | ICD-10-CM | POA: Diagnosis not present

## 2021-11-27 DIAGNOSIS — I4891 Unspecified atrial fibrillation: Secondary | ICD-10-CM | POA: Diagnosis not present

## 2021-11-27 DIAGNOSIS — R531 Weakness: Secondary | ICD-10-CM | POA: Diagnosis not present

## 2021-11-27 DIAGNOSIS — E1040 Type 1 diabetes mellitus with diabetic neuropathy, unspecified: Secondary | ICD-10-CM | POA: Diagnosis present

## 2021-11-27 DIAGNOSIS — Z880 Allergy status to penicillin: Secondary | ICD-10-CM | POA: Diagnosis not present

## 2021-11-27 DIAGNOSIS — N179 Acute kidney failure, unspecified: Secondary | ICD-10-CM | POA: Diagnosis present

## 2021-11-27 DIAGNOSIS — Z794 Long term (current) use of insulin: Secondary | ICD-10-CM | POA: Diagnosis not present

## 2021-11-27 DIAGNOSIS — I440 Atrioventricular block, first degree: Secondary | ICD-10-CM | POA: Diagnosis present

## 2021-11-27 DIAGNOSIS — E1022 Type 1 diabetes mellitus with diabetic chronic kidney disease: Secondary | ICD-10-CM | POA: Diagnosis present

## 2021-11-27 DIAGNOSIS — R000 Tachycardia, unspecified: Secondary | ICD-10-CM | POA: Diagnosis present

## 2021-11-27 DIAGNOSIS — Z79899 Other long term (current) drug therapy: Secondary | ICD-10-CM | POA: Diagnosis not present

## 2021-11-27 DIAGNOSIS — Z7982 Long term (current) use of aspirin: Secondary | ICD-10-CM | POA: Diagnosis not present

## 2021-11-27 DIAGNOSIS — N1831 Chronic kidney disease, stage 3a: Secondary | ICD-10-CM | POA: Diagnosis present

## 2021-11-27 DIAGNOSIS — E7800 Pure hypercholesterolemia, unspecified: Secondary | ICD-10-CM | POA: Diagnosis present

## 2021-11-27 DIAGNOSIS — Z8249 Family history of ischemic heart disease and other diseases of the circulatory system: Secondary | ICD-10-CM | POA: Diagnosis not present

## 2021-11-27 DIAGNOSIS — C9000 Multiple myeloma not having achieved remission: Secondary | ICD-10-CM | POA: Diagnosis present

## 2021-11-27 LAB — GLUCOSE, CAPILLARY
Glucose-Capillary: >600 mg/dL (ref 70–99)
Glucose-Capillary: >600 mg/dL (ref 70–99)
Glucose-Capillary: 526 mg/dL (ref 70–99)
Glucose-Capillary: >600 mg/dL (ref 70–99)
Glucose-Capillary: 519 mg/dL (ref 70–99)
Glucose-Capillary: 541 mg/dL (ref 70–99)
Glucose-Capillary: 434 mg/dL — ABNORMAL HIGH (ref 70–99)
Glucose-Capillary: 469 mg/dL — ABNORMAL HIGH (ref 70–99)
Glucose-Capillary: 363 mg/dL — ABNORMAL HIGH (ref 70–99)
Glucose-Capillary: 426 mg/dL — ABNORMAL HIGH (ref 70–99)
Glucose-Capillary: 368 mg/dL — ABNORMAL HIGH (ref 70–99)
Glucose-Capillary: 289 mg/dL — ABNORMAL HIGH (ref 70–99)
Glucose-Capillary: 248 mg/dL — ABNORMAL HIGH (ref 70–99)
Glucose-Capillary: 194 mg/dL — ABNORMAL HIGH (ref 70–99)
Glucose-Capillary: 173 mg/dL — ABNORMAL HIGH (ref 70–99)
Glucose-Capillary: 161 mg/dL — ABNORMAL HIGH (ref 70–99)
Glucose-Capillary: 198 mg/dL — ABNORMAL HIGH (ref 70–99)
Glucose-Capillary: 212 mg/dL — ABNORMAL HIGH (ref 70–99)
Glucose-Capillary: 184 mg/dL — ABNORMAL HIGH (ref 70–99)

## 2021-11-27 LAB — URINE DRUG SCREEN, QUALITATIVE (ARMC ONLY)
Amphetamines, Ur Screen: NOT DETECTED
Barbiturates, Ur Screen: NOT DETECTED
Benzodiazepine, Ur Scrn: NOT DETECTED
Cannabinoid 50 Ng, Ur ~~LOC~~: POSITIVE — AB
Cocaine Metabolite,Ur ~~LOC~~: POSITIVE — AB
MDMA (Ecstasy)Ur Screen: NOT DETECTED
Methadone Scn, Ur: NOT DETECTED
Opiate, Ur Screen: NOT DETECTED
Phencyclidine (PCP) Ur S: NOT DETECTED
Tricyclic, Ur Screen: NOT DETECTED

## 2021-11-27 LAB — BASIC METABOLIC PANEL
Anion gap: 15 (ref 5–15)
Anion gap: 9 (ref 5–15)
BUN: 52 mg/dL — ABNORMAL HIGH (ref 8–23)
BUN: 51 mg/dL — ABNORMAL HIGH (ref 8–23)
CO2: 18 mmol/L — ABNORMAL LOW (ref 22–32)
CO2: 26 mmol/L (ref 22–32)
Calcium: 8.4 mg/dL — ABNORMAL LOW (ref 8.9–10.3)
Calcium: 8.7 mg/dL — ABNORMAL LOW (ref 8.9–10.3)
Chloride: 107 mmol/L (ref 98–111)
Chloride: 111 mmol/L (ref 98–111)
Creatinine, Ser: 2.33 mg/dL — ABNORMAL HIGH (ref 0.61–1.24)
Creatinine, Ser: 2.09 mg/dL — ABNORMAL HIGH (ref 0.61–1.24)
GFR, Estimated: 30 mL/min — ABNORMAL LOW (ref >60)
GFR, Estimated: 34 mL/min — ABNORMAL LOW (ref >60)
Glucose, Bld: 557 mg/dL (ref 70–99)
Glucose, Bld: 265 mg/dL — ABNORMAL HIGH (ref 70–99)
Potassium: 4.3 mmol/L (ref 3.5–5.1)
Potassium: 3.6 mmol/L (ref 3.5–5.1)
Sodium: 140 mmol/L (ref 135–145)
Sodium: 146 mmol/L — ABNORMAL HIGH (ref 135–145)

## 2021-11-27 LAB — COMPREHENSIVE METABOLIC PANEL
ALT: 28 U/L (ref 0–44)
AST: 30 U/L (ref 15–41)
Albumin: 3.7 g/dL (ref 3.5–5.0)
Alkaline Phosphatase: 68 U/L (ref 38–126)
Anion gap: 25 — ABNORMAL HIGH (ref 5–15)
BUN: 49 mg/dL — ABNORMAL HIGH (ref 8–23)
CO2: 13 mmol/L — ABNORMAL LOW (ref 22–32)
Calcium: 8.8 mg/dL — ABNORMAL LOW (ref 8.9–10.3)
Chloride: 98 mmol/L (ref 98–111)
Creatinine, Ser: 2.26 mg/dL — ABNORMAL HIGH (ref 0.61–1.24)
GFR, Estimated: 31 mL/min — ABNORMAL LOW (ref >60)
Glucose, Bld: 685 mg/dL (ref 70–99)
Potassium: 6 mmol/L — ABNORMAL HIGH (ref 3.5–5.1)
Sodium: 136 mmol/L (ref 135–145)
Total Bilirubin: 1.1 mg/dL (ref 0.3–1.2)
Total Protein: 8.4 g/dL — ABNORMAL HIGH (ref 6.5–8.1)

## 2021-11-27 LAB — URINALYSIS, ROUTINE W REFLEX MICROSCOPIC
Bacteria, UA: NONE SEEN
Bilirubin Urine: NEGATIVE
Glucose, UA: >=500 mg/dL — AB
Hgb urine dipstick: NEGATIVE
Ketones, ur: 20 mg/dL — AB
Leukocytes,Ua: NEGATIVE
Nitrite: NEGATIVE
Protein, ur: NEGATIVE mg/dL
Specific Gravity, Urine: 1.021 (ref 1.005–1.030)
pH: 5 (ref 5.0–8.0)

## 2021-11-27 LAB — ECHOCARDIOGRAM COMPLETE
Calc EF: 61.7 %
S' Lateral: 2.3 cm
Single Plane A2C EF: 60 %
Single Plane A4C EF: 64 %
Weight: 2338.64 oz

## 2021-11-27 LAB — TROPONIN I (HIGH SENSITIVITY)
Troponin I (High Sensitivity): 33 ng/L — ABNORMAL HIGH (ref <18)
Troponin I (High Sensitivity): 43 ng/L — ABNORMAL HIGH (ref <18)

## 2021-11-27 LAB — STREP PNEUMONIAE URINARY ANTIGEN: Strep Pneumo Urinary Antigen: NEGATIVE

## 2021-11-27 LAB — MRSA NEXT GEN BY PCR, NASAL: MRSA by PCR Next Gen: NOT DETECTED

## 2021-11-27 LAB — LACTIC ACID, PLASMA
Lactic Acid, Venous: 6.7 mmol/L (ref 0.5–1.9)
Lactic Acid, Venous: 2.6 mmol/L (ref 0.5–1.9)

## 2021-11-27 LAB — MAGNESIUM: Magnesium: 2.4 mg/dL (ref 1.7–2.4)

## 2021-11-27 LAB — TSH: TSH: 0.894 u[IU]/mL (ref 0.350–4.500)

## 2021-11-27 LAB — BETA-HYDROXYBUTYRIC ACID
Beta-Hydroxybutyric Acid: >8 mmol/L — ABNORMAL HIGH (ref 0.05–0.27)
Beta-Hydroxybutyric Acid: 0.36 mmol/L — ABNORMAL HIGH (ref 0.05–0.27)

## 2021-11-27 LAB — CBG MONITORING, ED
Glucose-Capillary: >600 mg/dL (ref 70–99)
Glucose-Capillary: >600 mg/dL (ref 70–99)

## 2021-11-27 LAB — T4, FREE: Free T4: 0.93 ng/dL (ref 0.61–1.12)

## 2021-11-27 LAB — ETHANOL: Alcohol, Ethyl (B): <10 mg/dL (ref <10)

## 2021-11-27 LAB — LIPASE, BLOOD: Lipase: 25 U/L (ref 11–51)

## 2021-11-27 LAB — PHOSPHORUS: Phosphorus: 4.4 mg/dL (ref 2.5–4.6)

## 2021-11-27 LAB — HEMOGLOBIN A1C
Hgb A1c MFr Bld: 8.9 % — ABNORMAL HIGH (ref 4.8–5.6)
Mean Plasma Glucose: 208.73 mg/dL

## 2021-11-27 LAB — SARS CORONAVIRUS 2 BY RT PCR: SARS Coronavirus 2 by RT PCR: POSITIVE — AB

## 2021-11-27 LAB — PROCALCITONIN: Procalcitonin: 1.73 ng/mL

## 2021-11-27 MED ORDER — DEXTROSE-NACL 5-0.45 % IV SOLN: INTRAVENOUS | Status: DC

## 2021-11-27 MED ORDER — SODIUM CHLORIDE 0.9 % IV BOLUS: 20.0000 mL/kg | Freq: Once | INTRAVENOUS | Status: DC

## 2021-11-27 MED ORDER — SODIUM BICARBONATE 8.4 % IV SOLN
50.0000 meq | Freq: Once | INTRAVENOUS | Status: AC
  Administered 2021-11-27: 50 meq via INTRAVENOUS
  Filled 2021-11-27: qty 50

## 2021-11-27 MED ORDER — INSULIN ASPART 100 UNIT/ML IJ SOLN: 0.0000 [IU] | Freq: Every day | INTRAMUSCULAR | Status: DC

## 2021-11-27 MED ORDER — INSULIN REGULAR(HUMAN) IN NACL 100-0.9 UT/100ML-% IV SOLN
INTRAVENOUS | Status: DC
  Administered 2021-11-27: 9 [IU]/h via INTRAVENOUS
  Administered 2021-11-27: 8 [IU]/h via INTRAVENOUS
  Filled 2021-11-27 (×2): qty 100

## 2021-11-27 MED ORDER — APIXABAN 5 MG PO TABS
5.0000 mg | ORAL_TABLET | Freq: Two times a day (BID) | ORAL | Status: DC
  Administered 2021-11-27: 5 mg via ORAL
  Filled 2021-11-27: qty 1

## 2021-11-27 MED ORDER — LIVING WELL WITH DIABETES BOOK
Freq: Once | Status: AC
  Administered 2021-11-27: 1
  Filled 2021-11-27: qty 1

## 2021-11-27 MED ORDER — DEXTROSE 50 % IV SOLN: 0.0000 mL | INTRAVENOUS | Status: DC | PRN

## 2021-11-27 MED ORDER — INSULIN ASPART 100 UNIT/ML IJ SOLN
3.0000 [IU] | Freq: Three times a day (TID) | INTRAMUSCULAR | Status: DC
  Administered 2021-11-28: 3 [IU] via SUBCUTANEOUS
  Filled 2021-11-27: qty 1

## 2021-11-27 MED ORDER — DOCUSATE SODIUM 100 MG PO CAPS: 100.0000 mg | ORAL_CAPSULE | Freq: Two times a day (BID) | ORAL | Status: DC | PRN

## 2021-11-27 MED ORDER — INSULIN GLARGINE-YFGN 100 UNIT/ML ~~LOC~~ SOLN
16.0000 [IU] | Freq: Every day | SUBCUTANEOUS | Status: DC
Start: 2021-11-27 — End: 2021-11-28
  Administered 2021-11-27 – 2021-11-28 (×2): 16 [IU] via SUBCUTANEOUS
  Filled 2021-11-27 (×2): qty 0.16

## 2021-11-27 MED ORDER — CHLORHEXIDINE GLUCONATE CLOTH 2 % EX PADS
6.0000 | MEDICATED_PAD | Freq: Every day | CUTANEOUS | Status: DC
  Administered 2021-11-27: 6 via TOPICAL

## 2021-11-27 MED ORDER — CALCIUM GLUCONATE-NACL 1-0.675 GM/50ML-% IV SOLN
1.0000 g | Freq: Once | INTRAVENOUS | Status: AC
  Administered 2021-11-27: 1000 mg via INTRAVENOUS
  Filled 2021-11-27: qty 50

## 2021-11-27 MED ORDER — SODIUM CHLORIDE 0.9 % IV BOLUS
500.0000 mL | Freq: Once | INTRAVENOUS | Status: AC
  Administered 2021-11-27: 500 mL via INTRAVENOUS

## 2021-11-27 MED ORDER — POLYETHYLENE GLYCOL 3350 17 G PO PACK
17.0000 g | PACK | Freq: Every day | ORAL | Status: DC | PRN
Start: 2021-11-27 — End: 2021-11-28

## 2021-11-27 MED ORDER — INSULIN ASPART 100 UNIT/ML IJ SOLN
0.0000 [IU] | Freq: Three times a day (TID) | INTRAMUSCULAR | Status: DC
  Administered 2021-11-27: 3 [IU] via SUBCUTANEOUS
  Administered 2021-11-28: 2 [IU] via SUBCUTANEOUS
  Filled 2021-11-27: qty 1

## 2021-11-27 MED ORDER — SODIUM CHLORIDE 0.9 % IV SOLN: INTRAVENOUS | Status: DC

## 2021-11-27 MED ORDER — HEPARIN SODIUM (PORCINE) 5000 UNIT/ML IJ SOLN: 5000.0000 [IU] | Freq: Three times a day (TID) | INTRAMUSCULAR | Status: DC

## 2021-11-27 MED ORDER — LACTATED RINGERS IV SOLN: INTRAVENOUS | Status: DC

## 2021-11-27 NOTE — Inpatient Diabetes Management (Signed)
Inpatient Diabetes Program Recommendations  AACE/ADA: New Consensus Statement on Inpatient Glycemic Control (2015)  Target Ranges:  Prepandial:   less than 140 mg/dL      Peak postprandial:   less than 180 mg/dL (1-2 hours)      Critically ill patients:  140 - 180 mg/dL   Lab Results  Component Value Date   GLUCAP 289 (H) 11/27/2021   HGBA1C 10.2 (H) 08/09/2021    Review of Glycemic Control  Latest Reference Range & Units 11/26/21 23:24  CO2 22 - 32 mmol/L 13 (L)  Glucose 70 - 99 mg/dL 685 (HH)  Anion gap 5 - 15  25 (H)  (HH): Data is critically high (L): Data is abnormally low (H): Data is abnormally high  Latest Reference Range & Units 11/26/21 23:24  CO2 22 - 32 mmol/L 13 (L)  Glucose 70 - 99 mg/dL 685 (HH)  Anion gap 5 - 15  25 (H)  (HH): Data is critically high (L): Data is abnormally low (H): Data is abnormally high  Diabetes history: DM1(does not make insulin.  Needs correction, basal and meal coverage)  Outpatient Diabetes medications:  Tresiba 20 unist QD Novolog SSI  Current orders for Inpatient glycemic control:  IV insulin  Inpatient Diabetes Program Recommendations:    When MD is ready to transition to SQ insulin, please consider:  Semglee 16 units (80% of home dose) 2 hrs prior to discontinuing IV insulin then QD Novolog 0-9 units TID and 0-5 QHS Novolog 3 units TID with meals IF consumes at least 50%  Will continue to follow while inpatient.  Thank you, Reche Dixon, MSN, [place] Diabetes Coordinator Inpatient Diabetes Program [phone] (team pager from 8a-5p)

## 2021-11-27 NOTE — H&P (Addendum)
NAME:  GREYCEN FELTER, MRN:  [id number], DOB:  [birth year], LOS: 0 ADMISSION DATE:  11/26/2021, CONSULTATION DATE:  11/27/21 REFERRING MD:  Lurline Hare CHIEF COMPLAINT:  Hyperglycemia    HPI  71 y.o with significant PMH of multiple myeloma, atrial fibrillation, T1DM, AV block first-degree, CKD stage III, CVA, chronic anemia with abnormal SPEP, HTN, HLD and cocaine abuse who presented to the ED with chief complaints of hyperglycemia.  ED Course: In the emergency department, vital signs showed blood pressure of 138/73 mm Hg, tachycardia of 132 beats per minute, a respiratory rate of 18 breaths per minute, and a temperature of 97.6F (36.2C), oxygen saturation 98% on room air.   Pertinent Labs/Diagnostics Findings: Chemistry:Na+/ K+: 136/6.0,  glucose:685  BUN/Cr.:49/2.26, CO2 13, Anion Gap 25 CBC: WBC: 7.8 up from 3.1, Hgb/Hct: 11.1/36.9 Other Lab findings: Beta hydroxybutyrate >8.0 PCT: 1.73, Lactic acid: 6.7 COVID PCR: positive, Arterial Blood Gas result:  pO2 40; pCO2 35; pH 7.16;  HCO3 12.5, %O2 Sat 53.2.  Imaging:  CXR> no active disease  The laboratory data showed an anion gap, metabolic acidosis, and hyperglycemia consistent with the diagnosis of DKA. Urinalysis and chest xray demonstrated no evidence of infection. The patient's hemoglobin A1c (A1C) pending. The management was initiated with initial intravenous fluid, electrolytes replacement, and intervenous Insulin bolus followed by intravenous insulin infusion as per DKA protocol. Serum electrolytes were closely monitored.  Patient was also noted to be in A-fib with RVR, he received IV Cardizem with rate improvement.  PCCM consulted to admit for further management of DKA.  Past Medical History    Stroke (cerebrum) ([place]) 11/07/2021   Multiple myeloma ([place]) 09/29/2021   Multiple myeloma not having achieved remission ([place]) 09/29/2021   Chronic hyponatremia 08/18/2021   COVID-19 virus detected 08/13/2021   History of CVA  (cerebrovascular accident) 08/13/2021   Syncope and collapse 08/09/2021   Generalized abdominal pain 08/05/2021   Hepatic lesion 08/05/2021   Dyslipidemia 05/08/2021   History of substance abuse ([place]) 99/35/7017   Acute metabolic encephalopathy 79/39/0300   Cocaine abuse ([place]) 11/30/2020   Anemia of chronic disease 11/08/2020   Muscle twitching 09/12/2020   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   Pre-syncope 08/01/2020   Heart block AV first degree 08/01/2020   Anemia of chronic kidney failure, stage 3 (moderate) ([place]) 05/28/2020   PUD (peptic ulcer disease)     Esophageal dysphagia     Encounter for screening colonoscopy     Generalized weakness 07/28/2019   Hypoglycemia 04/27/2019   Unresponsiveness 04/27/2019   Lung nodule 04/07/2019   Uncontrolled type 1 diabetes mellitus     Acute renal failure with acute tubular necrosis superimposed on stage 3a chronic kidney disease ([place])     Hypertensive urgency 04/05/2019   AKI (acute kidney injury) ([place]) 04/05/2019   CKD (chronic kidney disease) stage 3, GFR 30-59 ml/min ([place]) 04/05/2019   DKA (diabetic ketoacidoses) 04/06/2016   Hypercholesterolemia 01/24/2016   Essential (primary) hypertension 06/07/2006   Type 1 diabetes mellitus with diabetic neuropathy, unspecified ([place])    [facility] Events   8/31: Admitted to ICU with DKA  Consults:  Diabetes coordinator  Procedures:  None  Significant Diagnostic Tests:  8/31: Chest Xray> no acute cardiopulmonary process  Micro Data:  8/30: SARS-CoV-2 PCR> positive 8/30: Blood culture x2> 8/30: Urine Culture> 8/31: MRSA PCR>>   Antimicrobials:  None  OBJECTIVE  Blood pressure 122/61, pulse 91, temperature (!) 97.2 F (36.2 C), temperature source Oral, resp. rate 20, SpO2 100 %.  Intake/Output Summary (Last 24 hours) at 11/27/2021 0303 Last data filed at 11/27/2021 0151 Gross per 24 hour  Intake 1000 ml  Output --  Net 1000 ml   There were no vitals  filed for this visit.   Physical Examination  GENERAL:71  year-old male critically ill patient lying in the bed with no acute distress.  EYES: Pupils equal, round, reactive to light and accommodation. No scleral icterus. Extraocular muscles intact.  HEENT: Head atraumatic, normocephalic. Oropharynx and nasopharynx clear.  NECK:  Supple, no jugular venous distention. No thyroid enlargement, no tenderness.  LUNGS: Normal breath sounds bilaterally, no wheezing, rales,rhonchi or crepitation. No use of accessory muscles of respiration.  CARDIOVASCULAR: S1, S2 normal. No murmurs, rubs, or gallops.  ABDOMEN: Soft, nontender, nondistended. Bowel sounds present. No organomegaly or mass.  EXTREMITIES: No pedal edema, cyanosis, or clubbing.  NEUROLOGIC: Cranial nerves II through XII are intact.  Muscle strength 5/5 in all extremities. Sensation intact. Gait not checked.  PSYCHIATRIC: The patient is alert and oriented x 1.  SKIN: No obvious rash, lesion, or ulcer.   Labs/imaging that I havepersonally reviewed  (right click and "Reselect all SmartList Selections" daily)     Labs   CBC: Recent Labs  Lab 11/21/21 1315 11/26/21 2324  WBC 3.1* 7.8  NEUTROABS 1.4* 6.6  HGB 8.2* 11.1*  HCT 25.4* 36.9*  MCV 98.4 101.9*  PLT 164 361    Basic Metabolic Panel: Recent Labs  Lab 11/21/21 1315 11/26/21 2324  NA 137 136  K 3.8 6.0*  CL 105 98  CO2 26 13*  GLUCOSE 138* 685*  BUN 20 49*  CREATININE 1.46* 2.26*  CALCIUM 8.0* 8.8*   GFR: Estimated Creatinine Clearance: 32.8 mL/min (A) (by C-G formula based on SCr of 2.26 mg/dL (H)). Recent Labs  Lab 11/21/21 1315 11/26/21 2324  PROCALCITON  --  1.73  WBC 3.1* 7.8  LATICACIDVEN  --  6.7*    Liver Function Tests: Recent Labs  Lab 11/21/21 1315 11/26/21 2324  AST 19 30  ALT 31 28  ALKPHOS 57 68  BILITOT 0.8 1.1  PROT 7.2 8.4*  ALBUMIN 3.1* 3.7   Recent Labs  Lab 11/26/21 2324  LIPASE 25   No results for input(s): "AMMONIA"  in the last 168 hours.  ABG    Component Value Date/Time   PHART 7.43 11/29/2020 1221   PCO2ART 46 11/29/2020 1221   PO2ART 85 11/29/2020 1221   HCO3 12.5 (L) 11/26/2021 2324   TCO2 23 04/02/2018 0003   ACIDBASEDEF 15.2 (H) 11/26/2021 2324   O2SAT 53.2 11/26/2021 2324     Coagulation Profile: No results for input(s): "INR", "PROTIME" in the last 168 hours.  Cardiac Enzymes: No results for input(s): "CKTOTAL", "CKMB", "CKMBINDEX", "TROPONINI" in the last 168 hours.  HbA1C: Hgb A1c MFr Bld  Date/Time Value Ref Range Status  08/09/2021 10:52 PM 10.2 (H) 4.8 - 5.6 % Final    Comment:    (NOTE) Pre diabetes:          5.7%-6.4%  Diabetes:              >6.4%  Glycemic control for   <7.0% adults with diabetes   09/11/2020 08:15 PM 10.8 (H) 4.8 - 5.6 % Final    Comment:    (NOTE)         Prediabetes: 5.7 - 6.4         Diabetes: >6.4         Glycemic control for adults  with diabetes: <7.0     CBG: Recent Labs  Lab 11/26/21 2321 11/27/21 0115 11/27/21 0150 11/27/21 0226  GLUCAP >600* >600* >600* >600*    Review of Systems:   UANBLE TO OBTAIN FROM PATIENT DUE TO AMS  Past Medical History  He,  has a past medical history of DKA (diabetic ketoacidoses) (04/06/2016), Hypercholesteremia, and Hypertension.   Surgical History    Past Surgical History:  Procedure Laterality Date   COLONOSCOPY WITH PROPOFOL N/A 02/01/2020   Procedure: COLONOSCOPY WITH PROPOFOL;  Surgeon: Lin Landsman, MD;  Location[facility] ENDOSCOPY;  Service: Gastroenterology;  Laterality: N/A;   ESOPHAGOGASTRODUODENOSCOPY  02/01/2020   Procedure: ESOPHAGOGASTRODUODENOSCOPY (EGD);  Surgeon: Lin Landsman, MD;  Location[facility] ENDOSCOPY;  Service: Gastroenterology;;   KNEE SURGERY Right    Torn meniscus   KNEE SURGERY Left      Social History   reports that he has never smoked. He has never used smokeless tobacco. He reports that he does not currently use alcohol. He reports that he does not  currently use drugs after having used the following drugs: Marijuana.   Family History   His family history includes Cancer in his sister; Heart attack in his father; Hypertension in his sister.   Allergies Allergies  Allergen Reactions   Penicillins Other (See Comments)    Has patient had a PCN reaction causing immediate rash, facial/tongue/throat swelling, SOB or lightheadedness with hypotension: No Has patient had a PCN reaction causing severe rash involving mucus membranes or skin necrosis: No Has patient had a PCN reaction that required hospitalization No Has patient had a PCN reaction occurring within the last 10 years: No If all of the above answers are "NO", then may proceed with Cephalosporin use.  Other reaction(s): Other (see comments) Other reaction(s): Other (See Comments) Has patient had a PCN reaction causing immediate rash, facial/tongue/throat swelling, SOB or lightheadedness with hypotension: No Has patient had a PCN reaction causing severe rash involving mucus membranes or skin necrosis: No Has patient had a PCN reaction that required hospitalization No Has patient had a PCN reaction occurring within the last 10 years: No If all of the above answers are "NO", then may proceed with Cephalosporin use. Has patient had a PCN reaction causing immediate rash, facial/tongue/throat swelling, SOB or lightheadedness with hypotension: No Has patient had a PCN reaction causing severe rash involving mucus membranes or skin necrosis: No Has patient had a PCN reaction that required hospitalization No Has patient had a PCN reaction occurring within the last 10 years: No If all of the above answers are "NO", then may proceed with Cephalosporin use.     Home Medications  Prior to Admission medications   Medication Sig Start Date End Date Taking? Authorizing Provider  acyclovir (ZOVIRAX) 400 MG tablet Take 1 tablet (400 mg total) by mouth 2 (two) times daily. 11/21/21   Sindy Guadeloupe,  MD  Alcohol Swabs (B-D SINGLE USE SWABS REGULAR) PADS Use to check blood sugar four times daily for type 1 diabetes 07/11/20   Birdie Sons, MD  amLODipine (NORVASC) 5 MG tablet Take 1 tablet (5 mg total) by mouth daily. 11/07/21   Hughie Closs, PA-C  ASPIRIN 81 PO Take 81 mg by mouth daily.    [provider]  atorvastatin (LIPITOR) 80 MG tablet Take 1 tablet (80 mg total) by mouth at bedtime. 08/11/21   Shawna Clamp, MD  baclofen 5 MG TABS Take 5 mg by mouth 3 (three) times daily  as needed for muscle spasms. 11/07/21   Ventura Sellers, MD  Blood Glucose Calibration (TRUE METRIX LEVEL 1) Low SOLN Use to check blood sugar four times daily for type 1 diabetes 07/11/20   Birdie Sons, MD  clopidogrel (PLAVIX) 75 MG tablet Take 1 tablet (75 mg total) by mouth daily. Patient not taking: Reported on 11/27/2021 08/12/21   Shawna Clamp, MD  FARXIGA 10 MG TABS tablet Take 10 mg by mouth daily. 05/05/21   [provider]  gabapentin (NEURONTIN) 300 MG capsule Take 300 mg by mouth in the morning and 600 mg at night. 04/04/21   [provider]  insulin degludec (TRESIBA FLEXTOUCH) 100 UNIT/ML FlexTouch Pen 15 units daily. Patient taking differently: Inject 20 Units into the skin daily. 20 units daily. 12/04/20   Aline August, MD  Insulin Syringe-Needle U-100 (INSULIN SYRINGE 1CC/31GX5/16") 31G X 5/16" 1 ML MISC For insulin injections up to 4 times daily 07/11/20   Birdie Sons, MD  lenalidomide (REVLIMID) 10 MG capsule TAKE 1 CAPSULE BY MOUTH EVERY DAY FOR 14 DAYS THEN HOLD FOR 7 DAYS. REPEAT EVERY 21 DAYS. 11/26/21   Sindy Guadeloupe, MD  lisinopril (ZESTRIL) 2.5 MG tablet Take 2.5 mg by mouth daily. 11/08/20 11/08/21  [provider]  NOVOLOG 100 UNIT/ML injection INJECT UP TO 14 UNITS THREE TIMES DAILY BEFORE MEALS ACCORDING TO SLIDING SCALE 09/09/21   Birdie Sons, MD  ondansetron (ZOFRAN) 4 MG tablet Take 1 tablet (4 mg total) by mouth every 8 (eight) hours as  needed for nausea or vomiting. Patient not taking: Reported on 10/17/2021 05/07/21   Merlyn Lot, MD  prochlorperazine (COMPAZINE) 10 MG tablet Take 1 tablet (10 mg total) by mouth every 6 (six) hours as needed (Nausea or vomiting). Patient not taking: Reported on 10/17/2021 09/29/21 11/21/21  Sindy Guadeloupe, MD    Scheduled Meds:  Chlorhexidine Gluconate Cloth  6 each Topical Daily   Continuous Infusions:  sodium chloride 125 mL/hr at 11/27/21 0116   dextrose 5 % and 0.45% NaCl     insulin 9 Units/hr (11/27/21 0119)   PRN Meds:.dextrose, docusate sodium, polyethylene glycol  Active Hospital Problem list     Assessment & Plan:  Diabetic Ketoacidosis likely multifactorial in the setting of noncompliance, ?underlying infectious process and Cocaine Intoxication Anion Gap Metabolic Acidosis PMHx: DM type 1  UDS +Cocaine + Marijuana -CXR, UA negative, Blood  and urine Cultures for infection pending -Troponin  slightly elevated, EKG  shows no ischemia -Will check Lipase for pancreatitis -Received Insulin (regular) 0.1u/kg (~10 units) IV x1. Continue Insulin drip, DKA protocol -Keep NPO -Glucose q1h to titrate insulin -4h BMP+Phosphorus+pH (ABG/VBG)  -Aggressive  volume repletion  -Goal to normalize anion gap  -Diabetes coordinator consult  Sepsis of unknown source Meets SIRS Criteria  -Monitor fever curve -Trend WBC's & Procalcitonin -Follow cultures as above -Hold empiric antibiotics pending cultures & sensitivities   AFib+RVR new onset? Likely provoked in the setting of above? Elevated Troponin likely demand ischemia  -Rate improved with IV cardizem -Serial EKGs -Trend Troponins -TSH, FT4 -TTEcho -Start Heparin  -Cardiology consult if appropriate  AKI on CKD stage III Hyperkalemia corrected with Insulin  -Monitor I&O's / urinary output -Follow BMP -Ensure adequate renal perfusion -Avoid nephrotoxic agents as able -Replace electrolytes as indicated  COVID-19  Infection Asymptomatic -Supplemental O2 as needed to maintain O2 saturations 88 to 92% -Inhalers with flutter valve  Hx Recent CVA HTN HLD -Continue Aspirin and Plavix -Continue  atorvastatin 80 mg -Hold lisinopril in setting of AKI -Continue amlodipine    Best practice:  Diet:  NPO Pain/Anxiety/Delirium protocol (if indicated): No VAP protocol (if indicated): Not indicated DVT prophylaxis: Subcutaneous Heparin GI prophylaxis: PPI Glucose control:  Insulin gtt Central venous access:  N/A Arterial line:  N/A Foley:  N/A Mobility:  bed rest  PT consulted: N/A Last date of multidisciplinary goals of care discussion [8/31] Code Status:  full code Disposition: Stepdown   = Goals of Care = Code Status Order: FULL  Primary Emergency Contact: [place], Home Phone: [phone] Wishes to pursue full aggressive treatment and intervention options, including CPR and intubation, but goals of care will be addressed on going with family if that should become necessary.  Critical care time: 45 minutes       Rufina Falco, DNP, CCRN, FNP-C, AGACNP-BC Acute Care Nurse Practitioner [facility]  PCCM on call pager [phone] until 7 am

## 2021-11-27 NOTE — Progress Notes (Signed)
Echocardiogram 2D Echocardiogram has been performed.  Nicholas Weiss 11/27/2021, 3:29 PM

## 2021-11-27 NOTE — Care Plan (Signed)
   BRIEF PCCM NOTE  Pt was seen earlier by PCCM provider Rufina Falco, NP.  Please see their H&P with attestation by Dr. Mortimer Fries for full assessment & plan.  BRIEF PT DESCRIPTION / SYNOPSIS :  71 y.o. male admitted with severe Diabetic Ketoacidosis,  likely multifactorial in the setting of med noncompliance, COVID-19 infection, and Cocaine/marijuana Intoxication.  Also with AKI on CKD Stage III, and new onset A. Fib w/ RVR.    SUBJECTIVE / INTERVAL HISTORY :  -Afebrile, hemodynamically stable, no vasopressors, on room air -Incidentally found to be COVID-positive, but is asymptomatic on room air, no infiltrates on chest x-ray -Anion gap and metabolic acidosis improving with fluids and insulin ~follow-up BMP is pending -Consult diabetes coronary for recommendations with conversion to basal and bolus dose insulin -Patient requesting to be able to eat and drink  OBJECTIVE :   Today's Vitals   11/27/21 0530 11/27/21 0600 11/27/21 0630 11/27/21 0700  BP: 131/63 (!) 126/57 (!) 132/57 (!) 151/79  Pulse: 92 92 90 89  Resp: '13 13 13 10  '$ Temp:      TempSrc:      SpO2: 100% 99% 98% 99%  Weight:      PainSc:       Body mass index is 20.68 kg/m.   ASSESSMENT / PLAN :    Diabetic Ketoacidosis likely multifactorial in the setting of noncompliance, ?underlying infectious process and Cocaine Intoxication Anion Gap Metabolic Acidosis PMHx: DM type 1  -Follow DKA protocol -Aggressive IVF -Insulin gtt -Follow BMP q4h -Once Anion gap closed and Bicarb >20, can convert to Bolus + basal insulin SQ -Check Hgb A1c -Consult Diabetes coordinator, appreciate input ~recommends starting Semglee 16 units daily, sliding scale insulin 0 to 9 units, and NovoLog 3 units 3 times daily with meals once ready for conversion off insulin drip -Encourage substance abuse counseling  Sepsis due to COVID-19 infection (asymptomatic) (Meets SIRS Criteria on admission) -Monitor fever curve -Trend WBC's &  Procalcitonin -Follow cultures as above -Chest x-ray without infiltrate and urinalysis negative for UTI -We will hold off on further antibiotics for now pending cultures and sensitivities -Trend inflammatory markers -Will hold off on Steroids and Remdesivir as pt is asymptomatic on room air and CXR without infiltrate  AFib+RVR new onset?, Likely provoked in the setting of above? Mildly elevated troponin, suspect demand ischemia -Continuous cardiac monitoring -Maintain MAP >65 -IV fluids -Thyroid panel normal (TSH 0.89, free T40.93) -Trend HS Troponin until peaked -Echocardiogram pending -Start Eliquis for anticoagulation -Consult cardiology, appreciate input  AKI on CKD stage III Hyperkalemia corrected with Insulin -Monitor I&O's / urinary output -Follow BMP -Ensure adequate renal perfusion -Avoid nephrotoxic agents as able -Replace electrolytes as indicated -IV fluids  Hx Recent CVA HTN HLD -Continue Aspirin and Plavix -Continue atorvastatin 80 mg -Hold lisinopril in setting of AKI -Continue amlodipine      Additional Critical Care Time: 30 minutes  Darel Hong, AGACNP-BC [facility] Prefer epic messenger for cross cover needs If after hours, please call E-link

## 2021-11-27 NOTE — TOC Initial Note (Signed)
Transition of Care [facility]) - Initial/Assessment Note    Patient Details  Name: Nicholas Weiss MRN: [id number] Date of Birth: 12-30-1950  Transition of Care [facility]) CM/SW Contact:    Nicholas Hutching, RN Phone Number: 11/27/2021, 2:57 PM  Clinical Narrative:                 Patient admitted to the hospital with DKA found to be covid positive on admission.  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  Patient drives.  RNCM attempted to call patient in the room but no answer, was able to speak with Nicholas Weiss via phone.  She reports that he has no trouble getting his medications and that he checks his blood sugars and takes his insulin like he should.  She reports that he has had problems with his high sugars previously.  He sees Dr. Ancil Boozer for endocrinology.   He would benefit from [facility] at discharge.  Corene Cornea with [facility] given referral for RN and PT.  Expected Discharge Plan: [place] Barriers to Discharge: Continued Medical Work up   Patient Goals and CMS Choice        Expected Discharge Plan and Services Expected Discharge Plan: [place]   Discharge Planning Services: CM Consult   Living arrangements for the past 2 months: Single Family Home                                      Prior Living Arrangements/Services Living arrangements for the past 2 months: Single Family Home Lives with:: Significant Other Patient language and need for interpreter reviewed:: Yes Do you feel safe going back to the place where you live?: Yes      Need for Family Participation in Patient Care: Yes (Comment) Care giver support system in place?: Yes (comment)   Criminal Activity/Legal Involvement Pertinent to Current Situation/Hospitalization: No - Comment as needed  Activities of Daily Living      Permission Sought/Granted Permission sought to share  information with : Case Manager, Family Supports    Share Information with NAME: Nicholas Weiss     Permission granted to share info w Relationship: signficant other  Permission granted to share info w Contact Information: [phone]  Emotional Assessment       Orientation: : Oriented to Self, Oriented to Place, Oriented to  Time, Oriented to Situation Alcohol / Substance Use: Illicit Drugs Psych Involvement: No (comment)  Admission diagnosis:  Hyperkalemia [E87.5] DKA (diabetic ketoacidosis) ([place]) [E11.10] Hyperglycemia [R73.9] Generalized weakness [R53.1] Diabetic ketoacidosis without coma associated with type 1 diabetes mellitus ([place]) [E10.10] Atrial fibrillation, unspecified type ([place]) [I48.91] Patient Active Problem List   Diagnosis Date Noted   DKA (diabetic ketoacidosis) ([place]) 11/27/2021   Stroke (cerebrum) ([place]) 11/07/2021   Multiple myeloma ([place]) 09/29/2021   Multiple myeloma not having achieved remission ([place]) 09/29/2021   Chronic hyponatremia 08/18/2021   COVID-19 virus detected 08/13/2021   History of CVA (cerebrovascular accident) 08/13/2021   Syncope and collapse 08/09/2021   Generalized abdominal pain 08/05/2021   Hepatic lesion 08/05/2021   Dyslipidemia 05/08/2021   History of substance abuse ([place]) 35/57/3220   Acute metabolic encephalopathy 25/42/7062   Cocaine abuse ([place]) 11/30/2020   Anemia of chronic disease 11/08/2020   Muscle twitching 09/12/2020   Smoldering multiple  myeloma 08/02/2020   Pre-syncope 08/01/2020   Heart block AV first degree 08/01/2020   Anemia of chronic kidney failure, stage 3 (moderate) ([place]) 05/28/2020   PUD (peptic ulcer disease)    Esophageal dysphagia    Encounter for screening colonoscopy    Generalized weakness 07/28/2019   Hypoglycemia 04/27/2019   Unresponsiveness 04/27/2019   Lung nodule 04/07/2019   Uncontrolled type 1 diabetes mellitus    Acute renal failure with acute tubular necrosis superimposed on stage 3a  chronic kidney disease ([place])    Hypertensive urgency 04/05/2019   AKI (acute kidney injury) ([place]) 04/05/2019   CKD (chronic kidney disease) stage 3, GFR 30-59 ml/min ([place]) 04/05/2019   DKA (diabetic ketoacidoses) 04/06/2016   Hypercholesterolemia 01/24/2016   Essential (primary) hypertension 06/07/2006   Type 1 diabetes mellitus with diabetic neuropathy, unspecified ([place]) 05/31/2006   PCP:  Dion Body, MD Pharmacy:   [facility] [address]), [place] ([place]) [place] [address] Phone: [phone] Fax: [place], [place] [address] Phone: [phone] Fax: [phone]  [place], [place] [address] Phone: [phone] Fax: [phone]     Social Determinants of Health (SDOH) Interventions    Readmission Risk Interventions    11/27/2021    2:55 PM 11/30/2020    9:44 AM  Readmission Risk Prevention Plan  Transportation Screening Complete Complete  Medication Review (RN Care Manager) Complete Complete  PCP or Specialist appointment within 3-5 days of discharge Complete Complete  HRI or Home Care Consult Complete Complete  SW Recovery Care/Counseling Consult Complete Complete  Palliative Care Screening Not Applicable Not Applicable  [place] Not Applicable Complete

## 2021-11-27 NOTE — TOC Progression Note (Signed)
Transition of Care [facility]) - Progression Note    Patient Details  Name: Nicholas Weiss MRN: [id number] Date of Birth: [birth year]  Transition of Care [facility]) CM/SW Contact  Shelbie Hutching, RN Phone Number: 11/27/2021, 4:12 PM  Clinical Narrative:    [facility] is unable to accept [facility] referral.    Expected Discharge Plan: [place] Barriers to Discharge: Continued Medical Work up  Expected Discharge Plan and Services Expected Discharge Plan: [place]   Discharge Planning Services: CM Consult   Living arrangements for the past 2 months: Single Family Home                                       Social Determinants of Health (SDOH) Interventions    Readmission Risk Interventions    11/27/2021    2:55 PM 11/30/2020    9:44 AM  Readmission Risk Prevention Plan  Transportation Screening Complete Complete  Medication Review ([place]) Complete Complete  PCP or Specialist appointment within 3-5 days of discharge Complete Complete  HRI or Home Care Consult Complete Complete  SW Recovery Care/Counseling Consult Complete Complete  Palliative Care Screening Not Applicable Not [place] Not Applicable Complete

## 2021-11-27 NOTE — Consult Note (Signed)
[place] NOTE       Patient ID: SOCORRO EBRON MRN: [id number] DOB/AGE: [birth year] 71 y.o.  Admit date: 11/26/2021 Referring Physician Darel Hong, NP  Primary Physician Dr. Netty Starring  Primary Cardiologist none Reason for Consultation ?AF RVR  HPI: BREKKEN BEACH is a 71yoM with a PMH of uncontrolled type 1 diabetes, multiple myeloma, CKD 3, history of CVA, HTN, HLD, cocaine abuse who presented to [facility] ED the late evening of 11/26/2021 with elevated blood sugar readings for 2 days in the setting of poor appetite and not taking his medications and 2 weeks of generalized weakness.  UDS positive for cocaine and marijuana.  He is being treated for DKA, incidentally COVID-positive on presentation, and tachycardic thought to be atrial fibrillation with RVR, so cardiology is consulted for further assistance.   Patient states he was not feeling great the past couple days, had poor appetite, and did not take any of his medications yesterday.  He currently states he feels somewhat of a rattle in his throat that somewhat sore.  He was found to be in DKA with profoundly elevated blood glucose at 685 with an anion gap of 25 that has now closed and elevated beta hydroxybutyrate at greater than 8, much improved to 0.36 this morning. He currently feels a lot better this morning, denies chest pain, shortness of breath, palpitations, presyncope or dizziness.  His initial EKG was read by the monitor as atrial fibrillation with RVR with rate 116, he was given a one-time dose of IV diltiazem with improvement in his heart rate to the 80s and 90s.   He thinks he was seen by a cardiologist once last year, he is unsure of the reason he was seen by cardiology, unsure if he has had a stress test or heart catheterization in the past (I have found no record of either study).  He does not use tobacco or drink alcohol.  Admits to marijuana use most recently 2 days ago, and occasional cocaine use,  most recently 7 days ago.  His vitals are notable for a blood pressure recently 153/71, heart rate 83.  Labs are notable for initially a potassium of 6.0, BUN/creatinine 49/2.26, GFR 31, blood glucose 685 with an anion gap of 25.  Most recent labs show improvement with sodium 146, BUN/creatinine 51/2.09, GFR of 30 blood glucose 265, and a closed anion gap of 9.  High-sensitivity troponin minimally elevated at 33-43.  H&H stable at 11/36 platelets 257.  TSH and T4 within normal limits.  I reviewed his EKGs and telemetry with Dr. Saralyn Pilar -  the patient has a baseline first-degree AV block with prolonged PR interval at 3104m. His initial EKG is most consistent with sinus tachycardia with prolonged PR interval (P waves buried in the peaked T waves).  Telemetry shows a mostly regular rhythm with a prolonged PR interval to the point that P waves are small and buried in the T wave.  Since P waves are present, this is inconsistent with atrial fibrillation.  Throughout the morning this rhythm has become more regular, with rate in the 80s to 90s.  Review of systems complete and found to be negative unless listed above     Past Medical History:  Diagnosis Date   DKA (diabetic ketoacidoses) 04/06/2016   Hypercholesteremia    Hypertension     Past Surgical History:  Procedure Laterality Date   COLONOSCOPY WITH PROPOFOL N/A 02/01/2020   Procedure: COLONOSCOPY WITH PROPOFOL;  Surgeon: VLin Landsman  MD;  Location: [facility] ENDOSCOPY;  Service: Gastroenterology;  Laterality: N/A;   ESOPHAGOGASTRODUODENOSCOPY  02/01/2020   Procedure: ESOPHAGOGASTRODUODENOSCOPY (EGD);  Surgeon: Lin Landsman, MD;  Location: [facility] ENDOSCOPY;  Service: Gastroenterology;;   KNEE SURGERY Right    Torn meniscus   KNEE SURGERY Left     Medications Prior to Admission  Medication Sig Dispense Refill Last Dose   acyclovir (ZOVIRAX) 400 MG tablet Take 1 tablet (400 mg total) by mouth 2 (two) times daily. 60 tablet 3    Alcohol  Swabs (B-D SINGLE USE SWABS REGULAR) PADS Use to check blood sugar four times daily for type 1 diabetes 300 each 4    amLODipine (NORVASC) 5 MG tablet Take 1 tablet (5 mg total) by mouth daily. 30 tablet 0    ASPIRIN 81 PO Take 81 mg by mouth daily.      atorvastatin (LIPITOR) 80 MG tablet Take 1 tablet (80 mg total) by mouth at bedtime. 30 tablet 2    baclofen 5 MG TABS Take 5 mg by mouth 3 (three) times daily as needed for muscle spasms. 60 tablet 1    Blood Glucose Calibration (TRUE METRIX LEVEL 1) Low SOLN Use to check blood sugar four times daily for type 1 diabetes 1 each 2    clopidogrel (PLAVIX) 75 MG tablet Take 1 tablet (75 mg total) by mouth daily. (Patient not taking: Reported on 11/27/2021) 20 tablet 0 Not Taking   FARXIGA 10 MG TABS tablet Take 10 mg by mouth daily.      gabapentin (NEURONTIN) 300 MG capsule Take 300 mg by mouth in the morning and 600 mg at night.      insulin degludec (TRESIBA FLEXTOUCH) 100 UNIT/ML FlexTouch Pen 15 units daily. (Patient taking differently: Inject 20 Units into the skin daily. 20 units daily.) 30 mL 3    Insulin Syringe-Needle U-100 (INSULIN SYRINGE 1CC/31GX5/16") 31G X 5/16" 1 ML MISC For insulin injections up to 4 times daily 270 each 4    lenalidomide (REVLIMID) 10 MG capsule TAKE 1 CAPSULE BY MOUTH EVERY DAY FOR 14 DAYS THEN HOLD FOR 7 DAYS. REPEAT EVERY 21 DAYS. 14 capsule 0    lisinopril (ZESTRIL) 2.5 MG tablet Take 2.5 mg by mouth daily.      NOVOLOG 100 UNIT/ML injection INJECT UP TO 14 UNITS THREE TIMES DAILY BEFORE MEALS ACCORDING TO SLIDING SCALE 10 mL 1    ondansetron (ZOFRAN) 4 MG tablet Take 1 tablet (4 mg total) by mouth every 8 (eight) hours as needed for nausea or vomiting. (Patient not taking: Reported on 10/17/2021) 20 tablet 0    Social History   Socioeconomic History   Marital status: Single    Spouse name: Not on file   Number of children: 3   Years of education: Not on file   Highest education level: High school graduate   Occupational History    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  Tobacco Use   Smoking status: Never   Smokeless tobacco: Never  Vaping Use   Vaping Use: Never used  Substance and Sexual Activity   Alcohol use: Not Currently    Alcohol/week: 0.0 - 1.0 standard drinks of alcohol    Comment: "once every 2 months"   Drug use: Not Currently    Types: Marijuana    Comment: last week   Sexual activity: Yes    Birth control/protection: None  Other Topics Concern   Not on file  Social History Narrative   Lives with girlfriend "  sharon"   Social Determinants of Health   Financial Resource Strain: Low Risk  (01/01/2020)   Overall Financial Resource Strain (CARDIA)    Difficulty of Paying Living Expenses: Not hard at all  Food Insecurity: No Food Insecurity (01/01/2020)   Hunger Vital Sign    Worried About Running Out of Food in the Last Year: Never true    Ran Out of Food in the Last Year: Never true  Transportation Needs: No Transportation Needs (01/01/2020)   PRAPARE - Hydrologist (Medical): No    Lack of Transportation (Non-Medical): No  Physical Activity: Insufficiently Active (01/01/2020)   Exercise Vital Sign    Days of Exercise per Week: 7 days    Minutes of Exercise per Session: 20 min  Stress: No Stress Concern Present (01/01/2020)   [place]    Feeling of Stress : Not at all  Social Connections: Moderately Isolated (01/01/2020)   Social Connection and Isolation Panel [NHANES]    Frequency of Communication with Friends and Family: More than three times a week    Frequency of Social Gatherings with Friends and Family: More than three times a week    Attends Religious Services: Never    Marine scientist or Organizations: No    Attends Archivist Meetings: Never    Marital Status: Living with partner  Intimate Partner Violence: Not At Risk (01/01/2020)   Humiliation, Afraid,  Rape, and Kick questionnaire    Fear of Current or Ex-Partner: No    Emotionally Abused: No    Physically Abused: No    Sexually Abused: No    Family History  Problem Relation Age of Onset   Heart attack Father    Hypertension Sister    Cancer Sister       PHYSICAL EXAM General: Pleasant black male, well nourished, appears fatigued.  Sitting upright in ICU bed. HEENT:  Normocephalic and atraumatic. Neck:  No JVD.  Lungs: Normal respiratory effort on room air. Clear bilaterally to auscultation. No wheezes, crackles, rhonchi.  Heart: HRRR . Normal S1 and S2 without gallops or murmurs.  Abdomen: Non-distended appearing.  Msk: Normal strength and tone for age. Extremities: Warm and well perfused. No clubbing, cyanosis.  No peripheral edema.  Neuro: Alert and oriented X 3. Psych:  Answers questions appropriately.   Labs: Basic Metabolic Panel: Recent Labs    11/27/21 0458 11/27/21 0959  NA 140 146*  K 4.3 3.6  CL 107 111  CO2 18* 26  GLUCOSE 557* 265*  BUN 52* 51*  CREATININE 2.33* 2.09*  CALCIUM 8.4* 8.7*  MG 2.4  --   PHOS 4.4  --    Liver Function Tests: Recent Labs    11/26/21 2324  AST 30  ALT 28  ALKPHOS 68  BILITOT 1.1  PROT 8.4*  ALBUMIN 3.7   Recent Labs    11/26/21 2324  LIPASE 25   CBC: Recent Labs    11/26/21 2324  WBC 7.8  NEUTROABS 6.6  HGB 11.1*  HCT 36.9*  MCV 101.9*  PLT 257   Cardiac Enzymes: Recent Labs    11/26/21 2324 11/27/21 0230  TROPONINIHS 33* 43*   BNP: Invalid input(s): "POCBNP" D-Dimer: No results for input(s): "DDIMER" in the last 72 hours. Hemoglobin A1C: No results for input(s): "HGBA1C" in the last 72 hours. Fasting Lipid Panel: No results for input(s): "CHOL", "HDL", "LDLCALC", "TRIG", "CHOLHDL", "LDLDIRECT" in the last 72  hours. Thyroid Function Tests: Recent Labs    11/27/21 0458  TSH 0.894   Anemia Panel: No results for input(s): "VITAMINB12", "FOLATE", "FERRITIN", "TIBC", "IRON", "RETICCTPCT"  in the last 72 hours.  DG Chest Port 1 View  Result Date: 11/26/2021 CLINICAL DATA:  Hyperglycemia and weakness. EXAM: PORTABLE CHEST 1 VIEW COMPARISON:  Aug 09, 2021 FINDINGS: The heart size and mediastinal contours are within normal limits. There is moderate severity calcification of the aortic arch. Both lungs are clear. The visualized skeletal structures are unremarkable. IMPRESSION: No active disease. Electronically Signed   By: Virgina Norfolk M.D.   On: 11/26/2021 23:46     Radiology: [facility] Chest Port 1 View  Result Date: 11/26/2021 CLINICAL DATA:  Hyperglycemia and weakness. EXAM: PORTABLE CHEST 1 VIEW COMPARISON:  Aug 09, 2021 FINDINGS: The heart size and mediastinal contours are within normal limits. There is moderate severity calcification of the aortic arch. Both lungs are clear. The visualized skeletal structures are unremarkable. IMPRESSION: No active disease. Electronically Signed   By: Virgina Norfolk M.D.   On: 11/26/2021 23:46   US Venous Img Lower Bilateral  Result Date: 11/21/2021 CLINICAL DATA:  Lower right leg swelling and pain, rule out DVT EXAM: BILATERAL LOWER EXTREMITY VENOUS DOPPLER ULTRASOUND TECHNIQUE: Gray-scale sonography with compression, as well as color and duplex ultrasound, were performed to evaluate the deep venous system(s) from the level of the common femoral vein through the popliteal and proximal calf veins. COMPARISON:  None Available. FINDINGS: On both sides: Normal compressibility of the common femoral, superficial femoral, and popliteal veins, as well as the visualized calf veins. Visualized portions of profunda femoral vein and great saphenous vein unremarkable. No filling defects to suggest DVT on grayscale or color Doppler imaging. Doppler waveforms show normal direction of venous flow, normal respiratory plasticity and response to augmentation. IMPRESSION: Negative for DVT in the lower extremities. Electronically Signed   By: Jorje Guild M.D.   On:  11/21/2021 17:07    ECHO 08/10/2021  1. Left ventricular ejection fraction, by estimation, is 60 to 65%. The  left ventricle has normal function. The left ventricle has no regional  wall motion abnormalities. There is mild concentric LVH with severe  asymmetric left ventricular hypertrophy of   the basal-septal segment (1.6 cm). LVOT gradient was not measured. Left  ventricular diastolic parameters are consistent with Grade II diastolic  dysfunction (pseudonormalization). The average left ventricular global  longitudinal strain is -14.9 %.   2. Right ventricular systolic function is normal. The right ventricular  size is normal. There is normal pulmonary artery systolic pressure.   3. The mitral valve is normal in structure. Mild mitral valve  regurgitation. No evidence of mitral stenosis.   4. The aortic valve is tricuspid. Aortic valve regurgitation is not  visualized. Aortic valve sclerosis is present, with no evidence of aortic  valve stenosis.   5. The inferior vena cava is normal in size with greater than 50%  respiratory variability, suggesting right atrial pressure of 3 mmHg.   TELEMETRY reviewed by me (LT) 11/27/2021 : narrow complex regular tachycardia most c/w sinus tachycardia with a long PR interval    EKG reviewed by me and Dr. Saralyn Pilar: sinus tachycardia rate 116 with long PR interval, artifact  Data reviewed by me (LT) 11/27/2021: Admission H&P, ED provider note, previous EKGs, CBC, BMP, troponin, prior echo, telemetry, vitals  ASSESSMENT AND PLAN:  JOHNCHARLES FUSSELMAN is a 39yoM with a PMH of uncontrolled type 1 diabetes, multiple  myeloma, CKD 3, history of CVA, HTN, HLD, cocaine abuse who presented to [facility] ED the late evening of 11/26/2021 with elevated blood sugar readings for 2 days in the setting of poor appetite and not taking his medications and 2 weeks of generalized weakness.  UDS positive for cocaine and marijuana.  He is being treated for DKA, incidentally  COVID-positive on presentation, and tachycardic thought to be atrial fibrillation with RVR, so cardiology is consulted for further assistance.   #DKA (h/o uncontrolled T1DM)  #Covid 19  #sinus rhythm 1* AV block  Presents with poor appetite, generalized weakness, labs reveal an anion gap metabolic acidosis in the setting of DKA and incidental finding of the COVID-19 infection.  Initial EKG was read by the monitor as atrial fibrillation with rate 116, however upon further interpretation with myself and Dr. Saralyn Pilar - the presence of P waves (buried in the peaked T wave) with a baseline prolonged PR interval is inconsistent with atrial fibrillation.  His heart rate improved after 1 dose of IV diltiazem and has been mostly in the 80s 90s throughout the day. -Agree with current therapy per primary team -S/p IV diltiazem 10 mg x 1 -Continue home blood pressure medicines, agree with holding ACE in the setting of an AKI -Defer beta-blocker with history of cocaine abuse, CCB is okay -Discontinue Eliquis -Echocardiogram complete -No further cardiac diagnostics necessary.  #Asymmetric LVH Seen on echo 07/2021 with grade 2 diastolic dysfunction.  Recommend aggressive blood pressure control, outpatient monitoring.  #Cocaine abuse Strongly recommended cessation  This patient's plan of care was discussed and created with Dr. Clayborn Bigness and he is in agreement.  Signed: Tristan Schroeder , PA-C 11/27/2021, 12:18 PM [facility]

## 2021-11-28 ENCOUNTER — Other Ambulatory Visit: Payer: Self-pay

## 2021-11-28 ENCOUNTER — Inpatient Hospital Stay: Payer: Medicare Other

## 2021-11-28 LAB — BASIC METABOLIC PANEL
Anion gap: 4 — ABNORMAL LOW (ref 5–15)
BUN: 45 mg/dL — ABNORMAL HIGH (ref 8–23)
CO2: 26 mmol/L (ref 22–32)
Calcium: 8.4 mg/dL — ABNORMAL LOW (ref 8.9–10.3)
Chloride: 113 mmol/L — ABNORMAL HIGH (ref 98–111)
Creatinine, Ser: 1.78 mg/dL — ABNORMAL HIGH (ref 0.61–1.24)
GFR, Estimated: 41 mL/min — ABNORMAL LOW (ref >60)
Glucose, Bld: 158 mg/dL — ABNORMAL HIGH (ref 70–99)
Potassium: 3.9 mmol/L (ref 3.5–5.1)
Sodium: 143 mmol/L (ref 135–145)

## 2021-11-28 LAB — PHOSPHORUS: Phosphorus: 3.1 mg/dL (ref 2.5–4.6)

## 2021-11-28 LAB — CBC
HCT: 26.6 % — ABNORMAL LOW (ref 39.0–52.0)
Hemoglobin: 8.6 g/dL — ABNORMAL LOW (ref 13.0–17.0)
MCH: 30.4 pg (ref 26.0–34.0)
MCHC: 32.3 g/dL (ref 30.0–36.0)
MCV: 94 fL (ref 80.0–100.0)
Platelets: 236 10*3/uL (ref 150–400)
RBC: 2.83 MIL/uL — ABNORMAL LOW (ref 4.22–5.81)
RDW: 14.9 % (ref 11.5–15.5)
WBC: 6.8 10*3/uL (ref 4.0–10.5)
nRBC: 0 % (ref 0.0–0.2)

## 2021-11-28 LAB — GLUCOSE, CAPILLARY
Glucose-Capillary: 168 mg/dL — ABNORMAL HIGH (ref 70–99)
Glucose-Capillary: 191 mg/dL — ABNORMAL HIGH (ref 70–99)

## 2021-11-28 LAB — LEGIONELLA PNEUMOPHILA SEROGP 1 UR AG: L. pneumophila Serogp 1 Ur Ag: NEGATIVE

## 2021-11-28 LAB — MAGNESIUM: Magnesium: 2.1 mg/dL (ref 1.7–2.4)

## 2021-11-28 LAB — FERRITIN: Ferritin: 283 ng/mL (ref 24–336)

## 2021-11-28 LAB — C-REACTIVE PROTEIN: CRP: <0.5 mg/dL (ref <1.0)

## 2021-11-28 LAB — D-DIMER, QUANTITATIVE: D-Dimer, Quant: 0.77 ug/mL-FEU — ABNORMAL HIGH (ref 0.00–0.50)

## 2021-11-28 LAB — PROCALCITONIN: Procalcitonin: 4.45 ng/mL

## 2021-11-28 MED ORDER — LISINOPRIL 10 MG PO TABS: 10.0000 mg | ORAL_TABLET | Freq: Every day | ORAL | 2 refills | Status: DC

## 2021-11-28 MED ORDER — ASPIRIN 81 MG PO TBEC
81.0000 mg | DELAYED_RELEASE_TABLET | Freq: Every day | ORAL | Status: DC
  Administered 2021-11-28: 81 mg via ORAL
  Filled 2021-11-28: qty 1

## 2021-11-28 MED ORDER — AMLODIPINE BESYLATE 10 MG PO TABS
10.0000 mg | ORAL_TABLET | Freq: Every day | ORAL | Status: DC
  Administered 2021-11-28: 10 mg via ORAL
  Filled 2021-11-28: qty 1

## 2021-11-28 MED ORDER — AMLODIPINE BESYLATE 10 MG PO TABS: 10.0000 mg | ORAL_TABLET | Freq: Every day | ORAL | 2 refills | Status: DC

## 2021-11-28 MED ORDER — TRESIBA FLEXTOUCH 100 UNIT/ML ~~LOC~~ SOPN: 20.0000 [IU] | PEN_INJECTOR | Freq: Every day | SUBCUTANEOUS | Status: DC

## 2021-11-28 NOTE — Consult Note (Signed)
PHARMACY CONSULT NOTE - FOLLOW UP  Pharmacy Consult for Electrolyte Monitoring and Replacement   Recent Labs: Potassium (mmol/L)  Date Value  11/28/2021 3.9  10/11/2012 4.2   Magnesium (mg/dL)  Date Value  11/28/2021 2.1   Calcium (mg/dL)  Date Value  11/28/2021 8.4 (L)   Calcium, Total (mg/dL)  Date Value  10/11/2012 8.7   Albumin (g/dL)  Date Value  11/26/2021 3.7  10/03/2020 3.3 (L)   Phosphorus (mg/dL)  Date Value  11/28/2021 3.1   Sodium (mmol/L)  Date Value  11/28/2021 143  10/03/2020 135  10/11/2012 137     Assessment: 70yoM with a PMH of uncontrolled type 1 diabetes, multiple myeloma, CKD 3, history of CVA, HTN, HLD, cocaine abuse who presented to [facility] ED the late evening of 11/26/2021 with elevated blood sugar readings for 2 days in the setting of poor appetite and not taking his medications and 2 weeks of generalized weakness.  Goal of Therapy:  Electrolytes WNL  Plan:  Electrolytes WNL. No need for repletion at this time. Pharmacy to sign off and monitor electrolytes peripherally but please re-engage if additional electrolyte concerns arise.   Darrick Penna ,PharmD Clinical Pharmacist 11/28/2021 9:35 AM

## 2021-11-28 NOTE — Discharge Summary (Signed)
Physician Discharge Summary   Nicholas Weiss  male DOB: 1950/12/03  Nicholas Weiss:[id number]  PCP: Nicholas Body, MD  Admit date: 11/26/2021 Discharge date: 11/28/2021  Admitted From: home Disposition:  home CODE STATUS: Full code  Discharge Instructions     Discharge instructions   Complete by: As directed    Your blood pressure has been high, so I have increased your amlodipine (from 5 mg to 10 mg) and Lisinopril (from 2.5 mg to 10 mg).   Nicholas Weiss [facility] Course:  For full details, please see H&P, progress notes, consult notes and ancillary notes.  Briefly,  Nicholas Weiss is a 71 y.o male with significant PMH of multiple myeloma, atrial fibrillation, T1DM, CKD stage III, CVA, chronic anemia, HTN, and cocaine abuse who presented to the ED with chief complaints of hyperglycemia.  Diabetic Ketoacidosis  likely multifactorial in the setting of noncompliance, Cocaine Intoxication Anion Gap Metabolic Acidosis DM type 1, uncontrolled --started on insulin gtt and transitioned to subQ insulin after about 14 hours.  Pt received IVF. --pt was discharged back on home insulin regimen.   Cocaine abuse UDS +Cocaine + Marijuana --pt advised to stop using cocaine  Sepsis, ruled out Met SIRS Criteria, however, no source of infection.   -CXR, UA negative.  Procal elevated, but no leukocytosis.   --pt was not started on abx   Afib, ruled out --cardiology consulted, and ruled out Afib   AKI on CKD stage IIIa --Cr 2.26 on presentation, improved with IVF to 1.78 prior to discharge.  Hyperkalemia  --resolved after insulin gtt  COVID-19 Infection Asymptomatic --was tested pos back in May 2023   HTN --BP elevated.  Increased amlodipine (from 5 mg to 10 mg) and Lisinopril (from 2.5 mg to 10 mg).  Hx Recent CVA --cont ASA 81.  Not on plavix PTA since pt finished the course. --cont statin  HLD -Continue atorvastatin 80 mg  multiple myeloma --pt is currently  COVID pos, so his oncology appointments and treatment are postponed by 2 weeks, per Dr. Janese Weiss.   Unless noted above, medications under "STOP" list are ones pt was not taking PTA.  Discharge Diagnoses:  Principal Problem:   DKA (diabetic ketoacidosis) ([place])   30 Day Unplanned Readmission Risk Score    Flowsheet Row ED to Hosp-Admission (Current) from 11/26/2021 in [place] [facility]  30 Day Unplanned Readmission Risk Score (%) 31.07 Filed at 11/28/2021 0801       This score is the patient's risk of an unplanned readmission within 30 days of being discharged (0 -100%). The score is based on dignosis, age, lab data, medications, orders, and past utilization.   Low:  0-14.9   Medium: 15-21.9   High: 22-29.9   Extreme: 30 and above         Discharge Instructions:  Allergies as of 11/28/2021       Reactions   Penicillins Other (See Comments)   Has patient had a PCN reaction causing immediate rash, facial/tongue/throat swelling, SOB or lightheadedness with hypotension: No Has patient had a PCN reaction causing severe rash involving mucus membranes or skin necrosis: No Has patient had a PCN reaction that required hospitalization No Has patient had a PCN reaction occurring within the last 10 years: No If all of the above answers are "NO", then may proceed with Cephalosporin use. Other reaction(s): Other (see comments) Other reaction(s): Other (See Comments) Has patient had a PCN reaction causing immediate  rash, facial/tongue/throat swelling, SOB or lightheadedness with hypotension: No Has patient had a PCN reaction causing severe rash involving mucus membranes or skin necrosis: No Has patient had a PCN reaction that required hospitalization No Has patient had a PCN reaction occurring within the last 10 years: No If all of the above answers are "NO", then may proceed with Cephalosporin use. Has patient had a PCN reaction causing immediate rash, facial/tongue/throat  swelling, SOB or lightheadedness with hypotension: No Has patient had a PCN reaction causing severe rash involving mucus membranes or skin necrosis: No Has patient had a PCN reaction that required hospitalization No Has patient had a PCN reaction occurring within the last 10 years: No If all of the above answers are "NO", then may proceed with Cephalosporin use.        Medication List     STOP taking these medications    clopidogrel 75 MG tablet Commonly known as: PLAVIX   ondansetron 4 MG tablet Commonly known as: Zofran       TAKE these medications    acyclovir 400 MG tablet Commonly known as: ZOVIRAX Take 1 tablet (400 mg total) by mouth 2 (two) times daily.   amLODipine 10 MG tablet Commonly known as: NORVASC Take 1 tablet (10 mg total) by mouth daily. What changed:  medication strength how much to take   ASPIRIN 81 PO Take 81 mg by mouth daily.   atorvastatin 80 MG tablet Commonly known as: LIPITOR Take 1 tablet (80 mg total) by mouth at bedtime.   B-D SINGLE USE SWABS REGULAR Pads Use to check blood sugar four times daily for type 1 diabetes   Baclofen 5 MG Tabs Take 5 mg by mouth 3 (three) times daily as needed for muscle spasms.   Farxiga 10 MG Tabs tablet Generic drug: dapagliflozin propanediol Take 10 mg by mouth daily.   gabapentin 300 MG capsule Commonly known as: NEURONTIN Take 300 mg by mouth in the morning and 600 mg at night.   INSULIN SYRINGE 1CC/31GX5/16" 31G X 5/16" 1 ML Misc For insulin injections up to 4 times daily   lenalidomide 10 MG capsule Commonly known as: REVLIMID TAKE 1 CAPSULE BY MOUTH EVERY DAY FOR 14 DAYS THEN HOLD FOR 7 DAYS. REPEAT EVERY 21 DAYS.   lisinopril 10 MG tablet Commonly known as: ZESTRIL Take 1 tablet (10 mg total) by mouth daily. What changed:  medication strength how much to take   NovoLOG 100 UNIT/ML injection Generic drug: insulin aspart INJECT UP TO 14 UNITS THREE TIMES DAILY BEFORE MEALS  ACCORDING TO SLIDING SCALE   Tresiba FlexTouch 100 UNIT/ML FlexTouch Pen Generic drug: insulin degludec Inject 20 Units into the skin daily. Home dose. What changed:  how much to take how to take this when to take this additional instructions   True Metrix Level 1 Low Soln Use to check blood sugar four times daily for type 1 diabetes         Follow-up Information     Nicholas Body, MD Follow up in 1 week(s).   Specialty: Family Medicine Contact information: [place] [address] [phone]                 Allergies  Allergen Reactions   Penicillins Other (See Comments)    Has patient had a PCN reaction causing immediate rash, facial/tongue/throat swelling, SOB or lightheadedness with hypotension: No Has patient had a PCN reaction causing severe rash involving mucus membranes or  skin necrosis: No Has patient had a PCN reaction that required hospitalization No Has patient had a PCN reaction occurring within the last 10 years: No If all of the above answers are "NO", then may proceed with Cephalosporin use.  Other reaction(s): Other (see comments) Other reaction(s): Other (See Comments) Has patient had a PCN reaction causing immediate rash, facial/tongue/throat swelling, SOB or lightheadedness with hypotension: No Has patient had a PCN reaction causing severe rash involving mucus membranes or skin necrosis: No Has patient had a PCN reaction that required hospitalization No Has patient had a PCN reaction occurring within the last 10 years: No If all of the above answers are "NO", then may proceed with Cephalosporin use. Has patient had a PCN reaction causing immediate rash, facial/tongue/throat swelling, SOB or lightheadedness with hypotension: No Has patient had a PCN reaction causing severe rash involving mucus membranes or skin necrosis: No Has patient had a PCN reaction that required hospitalization No Has  patient had a PCN reaction occurring within the last 10 years: No If all of the above answers are "NO", then may proceed with Cephalosporin use.     The results of significant diagnostics from this hospitalization (including imaging, microbiology, ancillary and laboratory) are listed below for reference.   Consultations:   Procedures/Studies: ECHOCARDIOGRAM COMPLETE  Result Date: 11/27/2021    ECHOCARDIOGRAM REPORT   Patient Name:   Nicholas Weiss Date of Exam: 11/27/2021 Medical Rec #:  [id number]       Height:       70.5 in Accession #:    [id number]      Weight:       146.2 lb Date of Birth:  12-Jan-1951      BSA:          1.837 m Patient Age:    71 years        BP:           139/61 mmHg Patient Gender: M               HR:           81 bpm. Exam Location:  [facility] Procedure: 2D Echo, Cardiac Doppler and Color Doppler Indications:     I48.91* Unspeicified atrial fibrillation  History:         Patient has prior history of Echocardiogram examinations, most                  recent 08/10/2021. Risk Factors:Hypertension.  Sonographer:     Bernadene Person RDCS Referring Phys:  [id number] Bradly Bienenstock Diagnosing Phys: Yolonda Kida MD IMPRESSIONS  1. Left ventricular ejection fraction, by estimation, is 70 to 75%. The left ventricle has hyperdynamic function. The left ventricle has no regional wall motion abnormalities. Left ventricular diastolic parameters were normal.  2. Right ventricular systolic function is normal. The right ventricular size is normal. There is normal pulmonary artery systolic pressure.  3. The mitral valve is normal in structure. No evidence of mitral valve regurgitation.  4. The aortic valve is normal in structure. Aortic valve regurgitation is not visualized. FINDINGS  Left Ventricle: Left ventricular ejection fraction, by estimation, is 70 to 75%. The left ventricle has hyperdynamic function. The left ventricle has no regional wall motion abnormalities. The left ventricular internal  cavity size was normal in size. There is no left ventricular hypertrophy. Left ventricular diastolic parameters were normal. Right Ventricle: The right ventricular size is normal. No increase in right ventricular wall thickness. Right ventricular  systolic function is normal. There is normal pulmonary artery systolic pressure. The tricuspid regurgitant velocity is 2.14 m/s, and  with an assumed right atrial pressure of 8 mmHg, the estimated right ventricular systolic pressure is 54.9 mmHg. Left Atrium: Left atrial size was normal in size. Right Atrium: Right atrial size was normal in size. Pericardium: There is no evidence of pericardial effusion. Mitral Valve: The mitral valve is normal in structure. No evidence of mitral valve regurgitation. Tricuspid Valve: The tricuspid valve is normal in structure. Tricuspid valve regurgitation is not demonstrated. Aortic Valve: The aortic valve is normal in structure. Aortic valve regurgitation is not visualized. Pulmonic Valve: The pulmonic valve was normal in structure. Pulmonic valve regurgitation is trivial. Aorta: The ascending aorta was not well visualized. IAS/Shunts: No atrial level shunt detected by color flow Doppler.  LEFT VENTRICLE PLAX 2D LVIDd:         4.60 cm LVIDs:         2.30 cm LV PW:         1.00 cm LV IVS:        1.20 cm LVOT diam:     2.10 cm LV SV:         76 LV SV Index:   41 LVOT Area:     3.46 cm  LV Volumes (MOD) LV vol d, MOD A2C: 97.7 ml LV vol d, MOD A4C: 96.0 ml LV vol s, MOD A2C: 39.1 ml LV vol s, MOD A4C: 34.6 ml LV SV MOD A2C:     58.6 ml LV SV MOD A4C:     96.0 ml LV SV MOD BP:      62.1 ml RIGHT VENTRICLE RV S prime:     10.60 cm/s TAPSE (M-mode): 2.1 cm LEFT ATRIUM             Index        RIGHT ATRIUM           Index LA diam:        3.50 cm 1.91 cm/m   RA Area:     17.20 cm LA Vol (A2C):   82.9 ml 45.14 ml/m  RA Volume:   43.50 ml  23.69 ml/m LA Vol (A4C):   71.3 ml 38.82 ml/m LA Biplane Vol: 77.0 ml 41.93 ml/m  AORTIC VALVE LVOT  Vmax:   101.00 cm/s LVOT Vmean:  70.000 cm/s LVOT VTI:    0.218 m  AORTA Ao Root diam: 3.60 cm Ao Asc diam:  3.40 cm TRICUSPID VALVE TR Peak grad:   18.3 mmHg TR Vmax:        214.00 cm/s  SHUNTS Systemic VTI:  0.22 m Systemic Diam: 2.10 cm Yolonda Kida MD Electronically signed by Yolonda Kida MD Signature Date/Time: 11/27/2021/11:08:39 PM    Final    DG Chest Port 1 View  Result Date: 11/26/2021 CLINICAL DATA:  Hyperglycemia and weakness. EXAM: PORTABLE CHEST 1 VIEW COMPARISON:  Aug 09, 2021 FINDINGS: The heart size and mediastinal contours are within normal limits. There is moderate severity calcification of the aortic arch. Both lungs are clear. The visualized skeletal structures are unremarkable. IMPRESSION: No active disease. Electronically Signed   By: Virgina Norfolk M.D.   On: 11/26/2021 23:46   US Venous Img Lower Bilateral  Result Date: 11/21/2021 CLINICAL DATA:  Lower right leg swelling and pain, rule out DVT EXAM: BILATERAL LOWER EXTREMITY VENOUS DOPPLER ULTRASOUND TECHNIQUE: Gray-scale sonography with compression, as well as color and duplex ultrasound, were performed  to evaluate the deep venous system(s) from the level of the common femoral vein through the popliteal and proximal calf veins. COMPARISON:  None Available. FINDINGS: On both sides: Normal compressibility of the common femoral, superficial femoral, and popliteal veins, as well as the visualized calf veins. Visualized portions of profunda femoral vein and great saphenous vein unremarkable. No filling defects to suggest DVT on grayscale or color Doppler imaging. Doppler waveforms show normal direction of venous flow, normal respiratory plasticity and response to augmentation. IMPRESSION: Negative for DVT in the lower extremities. Electronically Signed   By: Jorje Guild M.D.   On: 11/21/2021 17:07      Labs: BNP (last 3 results) No results for input(s): "BNP" in the last 8760 hours. Basic Metabolic Panel: Recent  Labs  Lab 11/21/21 1315 11/26/21 2324 11/27/21 0458 11/27/21 0959 11/28/21 0407  NA 137 136 140 146* 143  K 3.8 6.0* 4.3 3.6 3.9  CL 105 98 107 111 113*  CO2 26 13* 18* 26 26  GLUCOSE 138* 685* 557* 265* 158*  BUN 20 49* 52* 51* 45*  CREATININE 1.46* 2.26* 2.33* 2.09* 1.78*  CALCIUM 8.0* 8.8* 8.4* 8.7* 8.4*  MG  --   --  2.4  --  2.1  PHOS  --   --  4.4  --  3.1   Liver Function Tests: Recent Labs  Lab 11/21/21 1315 11/26/21 2324  AST 19 30  ALT 31 28  ALKPHOS 57 68  BILITOT 0.8 1.1  PROT 7.2 8.4*  ALBUMIN 3.1* 3.7   Recent Labs  Lab 11/26/21 2324  LIPASE 25   No results for input(s): "AMMONIA" in the last 168 hours. CBC: Recent Labs  Lab 11/21/21 1315 11/26/21 2324 11/28/21 0407  WBC 3.1* 7.8 6.8  NEUTROABS 1.4* 6.6  --   HGB 8.2* 11.1* 8.6*  HCT 25.4* 36.9* 26.6*  MCV 98.4 101.9* 94.0  PLT 164 257 236   Cardiac Enzymes: No results for input(s): "CKTOTAL", "CKMB", "CKMBINDEX", "TROPONINI" in the last 168 hours. BNP: Invalid input(s): "POCBNP" CBG: Recent Labs  Lab 11/27/21 1317 11/27/21 1436 11/27/21 1711 11/27/21 2112 11/28/21 0758  GLUCAP 161* 198* 212* 184* 168*   D-Dimer Recent Labs    11/28/21 0407  DDIMER 0.77*   Hgb A1c Recent Labs    11/27/21 0959  HGBA1C 8.9*   Lipid Profile No results for input(s): "CHOL", "HDL", "LDLCALC", "TRIG", "CHOLHDL", "LDLDIRECT" in the last 72 hours. Thyroid function studies Recent Labs    11/27/21 0458  TSH 0.894   Anemia work up Recent Labs    11/28/21 0407  FERRITIN 283   Urinalysis    Component Value Date/Time   COLORURINE YELLOW (A) 11/27/2021 0323   APPEARANCEUR CLEAR (A) 11/27/2021 0323   LABSPEC 1.021 11/27/2021 0323   PHURINE 5.0 11/27/2021 0323   GLUCOSEU >=500 (A) 11/27/2021 0323   HGBUR NEGATIVE 11/27/2021 0323   BILIRUBINUR NEGATIVE 11/27/2021 0323   KETONESUR 20 (A) 11/27/2021 0323   PROTEINUR NEGATIVE 11/27/2021 0323   NITRITE NEGATIVE 11/27/2021 0323    LEUKOCYTESUR NEGATIVE 11/27/2021 0323   Sepsis Labs Recent Labs  Lab 11/21/21 1315 11/26/21 2324 11/28/21 0407  WBC 3.1* 7.8 6.8   Microbiology Recent Results (from the past 240 hour(s))  Culture, blood (routine x 2)     Status: None (Preliminary result)   Collection Time: 11/26/21 11:24 PM   Specimen: BLOOD  Result Value Ref Range Status   Specimen Description BLOOD RIGHT [facility]  Final   Special Requests  Final    BOTTLES DRAWN AEROBIC AND ANAEROBIC Blood Culture results may not be optimal due to an excessive volume of blood received in culture bottles   Culture   Final    NO GROWTH 2 DAYS Performed at [facility], [address], [place], [place] [address]    Report Status PENDING  Incomplete  Culture, blood (routine x 2)     Status: None (Preliminary result)   Collection Time: 11/26/21 11:24 PM   Specimen: BLOOD  Result Value Ref Range Status   Specimen Description BLOOD LEFT AC  Final   Special Requests   Final    BOTTLES DRAWN AEROBIC AND ANAEROBIC Blood Culture results may not be optimal due to an excessive volume of blood received in culture bottles   Culture   Final    NO GROWTH 2 DAYS Performed at [facility], [address] [place], [place] [address]    Report Status PENDING  Incomplete  SARS Coronavirus 2 by RT PCR (hospital order, performed in [facility] hospital lab) *cepheid single result test* Anterior Nasal Swab     Status: Abnormal   Collection Time: 11/26/21 11:24 PM   Specimen: Anterior Nasal Swab  Result Value Ref Range Status   SARS Coronavirus 2 by RT PCR POSITIVE (A) NEGATIVE Final    Comment: (NOTE) SARS-CoV-2 target nucleic acids are DETECTED  SARS-CoV-2 RNA is generally detectable in upper respiratory specimens  during the acute phase of infection.  Positive results are indicative  of the presence of the identified virus, but do not rule out bacterial infection or co-infection with other pathogens not detected by the test.   Clinical correlation with patient history and  other diagnostic information is necessary to determine patient infection status.  The expected result is negative.  Fact Sheet for Patients:   https://www.patel.info/   Fact Sheet for Healthcare Providers:   https://hall.com/    This test is not yet approved or cleared by the [place] FDA and  has been authorized for detection and/or diagnosis of SARS-CoV-2 by FDA under an Emergency Use Authorization (EUA).  This EUA will remain in effect (meaning this test can be used) for the duration of  the COVID-19 declaration under Section 564(b)(1)  of the Act, 21 U.S.C. section 360-bbb-3(b)(1), unless the authorization is terminated or revoked sooner.   Performed at [facility], [address] [place], [place] [address]   MRSA Next Gen by PCR, Nasal     Status: None   Collection Time: 11/27/21  2:18 AM   Specimen: Nasal Mucosa; Nasal Swab  Result Value Ref Range Status   MRSA by PCR Next Gen NOT DETECTED NOT DETECTED Final    Comment: (NOTE) The GeneXpert MRSA Assay (FDA approved for NASAL specimens only), is one component of a comprehensive MRSA colonization surveillance program. It is not intended to diagnose MRSA infection nor to guide or monitor treatment for MRSA infections. Test performance is not FDA approved in patients less than 52 years old. Performed at [facility], [address] [place], [place] [address]      Total time spend on discharging this patient, including the last patient exam, discussing the hospital stay, instructions for ongoing care as it relates to all pertinent caregivers, as well as preparing the medical discharge records, prescriptions, and/or referrals as applicable, is 45 minutes.    Enzo Bi, MD  [facility] 11/28/2021, 10:43 AM

## 2021-11-28 NOTE — Progress Notes (Signed)
Discussed discharge instruction with patient, including medications and follow up appointments.  Sent home AVS documentation.

## 2021-12-01 ENCOUNTER — Encounter: Payer: Self-pay | Admitting: Oncology

## 2021-12-01 LAB — CULTURE, BLOOD (ROUTINE X 2)
Culture: NO GROWTH
Culture: NO GROWTH

## 2021-12-01 NOTE — Progress Notes (Signed)
Hematology/Oncology Consult note [facility]  Telephone:([phone] [phone]  Patient Care Team: Dion Body, MD as PCP - General (Family Medicine) [place], [place] (Optometry) Sindy Guadeloupe, MD as Consulting Physician (Oncology)   Name of the patient: Nicholas Weiss  [id number]  Jan 03, 1951   Date of visit: 12/01/21  Diagnosis- high risk IgG lambda multiple myeloma R-ISS stage III  Chief complaint/ Reason for visit- on Treatment assessment prior to cycle 3-day 1 of Velcade  Heme/Onc history: Patient is a 71 year old African-American male with a past medical history significant for uncontrolled type 1 diabetes, chronic kidney disease stage III chronic normocytic anemia referred for abnormal SPEP. Patient's creatinine has been fluctuating between 1.5-2 but about 5 months ago it went up all the way to 5 and his blood sugars were in the 1000 range. More recently his kidney numbers have been drifting back to normal values. As a part of the work-up he had serum protein electrophoresis done which showed an elevated gammaglobulin fraction with an M spike of 3%. The amount of M spike was not quantified in the specimen. He has therefore been referred to [place] for further management. Patient endorses chronic fatigue reports that his appetite and weight have remained stable   Results of blood work from 04/08/2020 were as follows: CBC showed white count of 2.9, H&H of 10.5/31.2 with an MCV of 91 and a platelet count of 191.  CMP showed a mildly elevated creatinine of 1.2 which was better as compared to 5 months ago when it was 3.9.  Total protein was mildly elevated at 9.1 calcium normal at 8.4 ferritin and iron studies B12 folate TSH and haptoglobin were normal.  Myeloma panel revealed an elevated IgG level of 06/04/2004 with an M protein of 3.1 g.  Immunofixation showed IgG lambda specificity.  Serum free light chain ratio was elevated at 25 and free light  chain lambda elevated at 370   Further myeloma work-up including a PET CT scan did not reveal any evidence of edematous lesions.  Bone marrow biopsy showed 23% plasma cells by manual count and 30% by CD138 IHC.  Normal cytogenetics.  FISH studies for myeloma showed gain of 1 q.  13 q-. detected.  P53 not detected.   He was treated for a year and has having smoldering multiple myeloma since both CKD and anemia have been stable for 3 to 4 years and could be secondary to uncontrolled diabetes.In June 2023 IgG levels increased to 5463 with an M protein of 4.1 g as compared to 2.6 g in September 2022.  Serum free light chain ratio remains around 25.  Therefore a repeat bone marrow biopsy was done which shows further increase in plasma cells ranging from 30 to 70% and by immunohistochemistry 60 to 70% of the cells were positive for CD138.  Cytogenetics normal.  FISH study showed 4: 14 translocation and gain of 1 q. making this high risk RISS stage III.  Repeat PET scan showed no lytic lesions    Interval history-patient has baseline fatigue which is remained unchanged.  Reports being compliant with his Revlimid and aspirin and acyclovir.  Does complain of bilateral lower extremity swelling right greater than left  ECOG PS- 2 Pain scale- 0   Review of systems- Review of Systems  Constitutional:  Positive for malaise/fatigue. Negative for chills, fever and weight loss.  HENT:  Negative for congestion, ear discharge and nosebleeds.   Eyes:  Negative for blurred vision.  Respiratory:  Negative for cough, hemoptysis, sputum production, shortness of breath and wheezing.   Cardiovascular:  Positive for leg swelling. Negative for chest pain, palpitations, orthopnea and claudication.  Gastrointestinal:  Negative for abdominal pain, blood in stool, constipation, diarrhea, heartburn, melena, nausea and vomiting.  Genitourinary:  Negative for dysuria, flank pain, frequency, hematuria and urgency.   Musculoskeletal:  Negative for back pain, joint pain and myalgias.  Skin:  Negative for rash.  Neurological:  Negative for dizziness, tingling, focal weakness, seizures, weakness and headaches.  Endo/Heme/Allergies:  Does not bruise/bleed easily.  Psychiatric/Behavioral:  Negative for depression and suicidal ideas. The patient does not have insomnia.       Allergies  Allergen Reactions   Penicillins Other (See Comments)    Has patient had a PCN reaction causing immediate rash, facial/tongue/throat swelling, SOB or lightheadedness with hypotension: No Has patient had a PCN reaction causing severe rash involving mucus membranes or skin necrosis: No Has patient had a PCN reaction that required hospitalization No Has patient had a PCN reaction occurring within the last 10 years: No If all of the above answers are "NO", then may proceed with Cephalosporin use.  Other reaction(s): Other (see comments) Other reaction(s): Other (See Comments) Has patient had a PCN reaction causing immediate rash, facial/tongue/throat swelling, SOB or lightheadedness with hypotension: No Has patient had a PCN reaction causing severe rash involving mucus membranes or skin necrosis: No Has patient had a PCN reaction that required hospitalization No Has patient had a PCN reaction occurring within the last 10 years: No If all of the above answers are "NO", then may proceed with Cephalosporin use. Has patient had a PCN reaction causing immediate rash, facial/tongue/throat swelling, SOB or lightheadedness with hypotension: No Has patient had a PCN reaction causing severe rash involving mucus membranes or skin necrosis: No Has patient had a PCN reaction that required hospitalization No Has patient had a PCN reaction occurring within the last 10 years: No If all of the above answers are "NO", then may proceed with Cephalosporin use.     Past Medical History:  Diagnosis Date   DKA (diabetic ketoacidoses) 04/06/2016    Hypercholesteremia    Hypertension      Past Surgical History:  Procedure Laterality Date   COLONOSCOPY WITH PROPOFOL N/A 02/01/2020   Procedure: COLONOSCOPY WITH PROPOFOL;  Surgeon: Lin Landsman, MD;  Location: [facility] ENDOSCOPY;  Service: Gastroenterology;  Laterality: N/A;   ESOPHAGOGASTRODUODENOSCOPY  02/01/2020   Procedure: ESOPHAGOGASTRODUODENOSCOPY (EGD);  Surgeon: Lin Landsman, MD;  Location: [facility] ENDOSCOPY;  Service: Gastroenterology;;   KNEE SURGERY Right    Torn meniscus   KNEE SURGERY Left     Social History   Socioeconomic History   Marital status: Single    Spouse name: Not on file   Number of children: 3   Years of education: Not on file   Highest education level: High school graduate  Occupational History    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  Tobacco Use   Smoking status: Never   Smokeless tobacco: Never  Vaping Use   Vaping Use: Never used  Substance and Sexual Activity   Alcohol use: Not Currently    Alcohol/week: 0.0 - 1.0 standard drinks of alcohol    Comment: "once every 2 months"   Drug use: Not Currently    Types: Marijuana    Comment: last week   Sexual activity: Yes    Birth control/protection: None  Other Topics Concern   Not on file  Social History Narrative   Lives with girlfriend "sharon"   Social Determinants of Health   Financial Resource Strain: Low Risk  (01/01/2020)   Overall Financial Resource Strain (CARDIA)    Difficulty of Paying Living Expenses: Not hard at all  Food Insecurity: No Food Insecurity (01/01/2020)   Hunger Vital Sign    Worried About Running Out of Food in the Last Year: Never true    [place] in the Last Year: Never true  Transportation Needs: No Transportation Needs (01/01/2020)   PRAPARE - Hydrologist (Medical): No    Lack of Transportation (Non-Medical): No  Physical Activity: Insufficiently Active (01/01/2020)   Exercise Vital Sign    Days of Exercise per Week:  7 days    Minutes of Exercise per Session: 20 min  Stress: No Stress Concern Present (01/01/2020)   [place]    Feeling of Stress : Not at all  Social Connections: Moderately Isolated (01/01/2020)   Social Connection and Isolation Panel [NHANES]    Frequency of Communication with Friends and Family: More than three times a week    Frequency of Social Gatherings with Friends and Family: More than three times a week    Attends Religious Services: Never    Marine scientist or Organizations: No    Attends Archivist Meetings: Never    Marital Status: Living with partner  Intimate Partner Violence: Not At Risk (01/01/2020)   Humiliation, Afraid, Rape, and Kick questionnaire    Fear of Current or Ex-Partner: No    Emotionally Abused: No    Physically Abused: No    Sexually Abused: No    Family History  Problem Relation Age of Onset   Heart attack Father    Hypertension Sister    Cancer Sister      Current Outpatient Medications:    Alcohol Swabs (B-D SINGLE USE SWABS REGULAR) PADS, Use to check blood sugar four times daily for type 1 diabetes, Disp: 300 each, Rfl: 4   ASPIRIN 81 PO, Take 81 mg by mouth daily., Disp: , Rfl:    atorvastatin (LIPITOR) 80 MG tablet, Take 1 tablet (80 mg total) by mouth at bedtime., Disp: 30 tablet, Rfl: 2   baclofen 5 MG TABS, Take 5 mg by mouth 3 (three) times daily as needed for muscle spasms., Disp: 60 tablet, Rfl: 1   Blood Glucose Calibration (TRUE METRIX LEVEL 1) Low SOLN, Use to check blood sugar four times daily for type 1 diabetes, Disp: 1 each, Rfl: 2   FARXIGA 10 MG TABS tablet, Take 10 mg by mouth daily., Disp: , Rfl:    gabapentin (NEURONTIN) 300 MG capsule, Take 300 mg by mouth in the morning and 600 mg at night., Disp: , Rfl:    Insulin Syringe-Needle U-100 (INSULIN SYRINGE 1CC/31GX5/16") 31G X 5/16" 1 ML MISC, For insulin injections up to 4 times daily, Disp:  270 each, Rfl: 4   NOVOLOG 100 UNIT/ML injection, INJECT UP TO 14 UNITS THREE TIMES DAILY BEFORE MEALS ACCORDING TO SLIDING SCALE, Disp: 10 mL, Rfl: 1   acyclovir (ZOVIRAX) 400 MG tablet, Take 1 tablet (400 mg total) by mouth 2 (two) times daily., Disp: 60 tablet, Rfl: 3   amLODipine (NORVASC) 10 MG tablet, Take 1 tablet (10 mg total) by mouth daily., Disp: 30 tablet, Rfl: 2   insulin degludec (TRESIBA FLEXTOUCH) 100 UNIT/ML FlexTouch Pen, Inject 20 Units into  the skin daily. Home dose., Disp: , Rfl:    lenalidomide (REVLIMID) 10 MG capsule, TAKE 1 CAPSULE BY MOUTH EVERY DAY FOR 14 DAYS THEN HOLD FOR 7 DAYS. REPEAT EVERY 21 DAYS., Disp: 14 capsule, Rfl: 0   lisinopril (ZESTRIL) 10 MG tablet, Take 1 tablet (10 mg total) by mouth daily., Disp: 30 tablet, Rfl: 2  Physical exam:  Vitals:   11/21/21 1322  BP: (!) 142/71  Pulse: 73  Resp: 18  Temp: (!) 95 F (35 C)  TempSrc: Tympanic  SpO2: 100%  Weight: 164 lb (74.4 kg)   Physical Exam Constitutional:      General: He is not in acute distress. Cardiovascular:     Rate and Rhythm: Normal rate and regular rhythm.     Heart sounds: Normal heart sounds.  Pulmonary:     Effort: Pulmonary effort is normal.     Breath sounds: Normal breath sounds.  Musculoskeletal:     Comments: B/l LE edema right greater than left  Skin:    General: Skin is warm and dry.  Neurological:     Mental Status: He is alert and oriented to person, place, and time.         Latest Ref Rng & Units 11/28/2021    4:07 AM  CMP  Glucose 70 - 99 mg/dL 158   BUN 8 - 23 mg/dL 45   Creatinine 0.61 - 1.24 mg/dL 1.78   Sodium 135 - 145 mmol/L 143   Potassium 3.5 - 5.1 mmol/L 3.9   Chloride 98 - 111 mmol/L 113   CO2 22 - 32 mmol/L 26   Calcium 8.9 - 10.3 mg/dL 8.4       Latest Ref Rng & Units 11/28/2021    4:07 AM  CBC  WBC 4.0 - 10.5 K/uL 6.8   Hemoglobin 13.0 - 17.0 g/dL 8.6   Hematocrit 39.0 - 52.0 % 26.6   Platelets 150 - 400 K/uL 236     No images are  attached to the encounter.  ECHOCARDIOGRAM COMPLETE  Result Date: 11/27/2021    ECHOCARDIOGRAM REPORT   Patient Name:   ASHETON SCHEFFLER Date of Exam: 11/27/2021 Medical Rec #:  [id number]       Height:       70.5 in Accession #:    [id number]      Weight:       146.2 lb Date of Birth:  27-Sep-1950      BSA:          1.837 m Patient Age:    34 years        BP:           139/61 mmHg Patient Gender: M               HR:           81 bpm. Exam Location:  [facility] Procedure: 2D Echo, Cardiac Doppler and Color Doppler Indications:     I48.91* Unspeicified atrial fibrillation  History:         Patient has prior history of Echocardiogram examinations, most                  recent 08/10/2021. Risk Factors:Hypertension.  Sonographer:     Bernadene Person RDCS Referring Phys:  [id number] Bradly Bienenstock Diagnosing Phys: Yolonda Kida MD IMPRESSIONS  1. Left ventricular ejection fraction, by estimation, is 70 to 75%. The left ventricle has hyperdynamic function. The left ventricle has no regional wall motion  abnormalities. Left ventricular diastolic parameters were normal.  2. Right ventricular systolic function is normal. The right ventricular size is normal. There is normal pulmonary artery systolic pressure.  3. The mitral valve is normal in structure. No evidence of mitral valve regurgitation.  4. The aortic valve is normal in structure. Aortic valve regurgitation is not visualized. FINDINGS  Left Ventricle: Left ventricular ejection fraction, by estimation, is 70 to 75%. The left ventricle has hyperdynamic function. The left ventricle has no regional wall motion abnormalities. The left ventricular internal cavity size was normal in size. There is no left ventricular hypertrophy. Left ventricular diastolic parameters were normal. Right Ventricle: The right ventricular size is normal. No increase in right ventricular wall thickness. Right ventricular systolic function is normal. There is normal pulmonary artery systolic  pressure. The tricuspid regurgitant velocity is 2.14 m/s, and  with an assumed right atrial pressure of 8 mmHg, the estimated right ventricular systolic pressure is 44.6 mmHg. Left Atrium: Left atrial size was normal in size. Right Atrium: Right atrial size was normal in size. Pericardium: There is no evidence of pericardial effusion. Mitral Valve: The mitral valve is normal in structure. No evidence of mitral valve regurgitation. Tricuspid Valve: The tricuspid valve is normal in structure. Tricuspid valve regurgitation is not demonstrated. Aortic Valve: The aortic valve is normal in structure. Aortic valve regurgitation is not visualized. Pulmonic Valve: The pulmonic valve was normal in structure. Pulmonic valve regurgitation is trivial. Aorta: The ascending aorta was not well visualized. IAS/Shunts: No atrial level shunt detected by color flow Doppler.  LEFT VENTRICLE PLAX 2D LVIDd:         4.60 cm LVIDs:         2.30 cm LV PW:         1.00 cm LV IVS:        1.20 cm LVOT diam:     2.10 cm LV SV:         76 LV SV Index:   41 LVOT Area:     3.46 cm  LV Volumes (MOD) LV vol d, MOD A2C: 97.7 ml LV vol d, MOD A4C: 96.0 ml LV vol s, MOD A2C: 39.1 ml LV vol s, MOD A4C: 34.6 ml LV SV MOD A2C:     58.6 ml LV SV MOD A4C:     96.0 ml LV SV MOD BP:      62.1 ml RIGHT VENTRICLE RV S prime:     10.60 cm/s TAPSE (M-mode): 2.1 cm LEFT ATRIUM             Index        RIGHT ATRIUM           Index LA diam:        3.50 cm 1.91 cm/m   RA Area:     17.20 cm LA Vol (A2C):   82.9 ml 45.14 ml/m  RA Volume:   43.50 ml  23.69 ml/m LA Vol (A4C):   71.3 ml 38.82 ml/m LA Biplane Vol: 77.0 ml 41.93 ml/m  AORTIC VALVE LVOT Vmax:   101.00 cm/s LVOT Vmean:  70.000 cm/s LVOT VTI:    0.218 m  AORTA Ao Root diam: 3.60 cm Ao Asc diam:  3.40 cm TRICUSPID VALVE TR Peak grad:   18.3 mmHg TR Vmax:        214.00 cm/s  SHUNTS Systemic VTI:  0.22 m Systemic Diam: 2.10 cm Yolonda Kida MD Electronically signed by Yolonda Kida MD Signature  Date/Time: 11/27/2021/11:08:39 PM    Final    DG Chest Port 1 View  Result Date: 11/26/2021 CLINICAL DATA:  Hyperglycemia and weakness. EXAM: PORTABLE CHEST 1 VIEW COMPARISON:  Aug 09, 2021 FINDINGS: The heart size and mediastinal contours are within normal limits. There is moderate severity calcification of the aortic arch. Both lungs are clear. The visualized skeletal structures are unremarkable. IMPRESSION: No active disease. Electronically Signed   By: Virgina Norfolk M.D.   On: 11/26/2021 23:46   US Venous Img Lower Bilateral  Result Date: 11/21/2021 CLINICAL DATA:  Lower right leg swelling and pain, rule out DVT EXAM: BILATERAL LOWER EXTREMITY VENOUS DOPPLER ULTRASOUND TECHNIQUE: Gray-scale sonography with compression, as well as color and duplex ultrasound, were performed to evaluate the deep venous system(s) from the level of the common femoral vein through the popliteal and proximal calf veins. COMPARISON:  None Available. FINDINGS: On both sides: Normal compressibility of the common femoral, superficial femoral, and popliteal veins, as well as the visualized calf veins. Visualized portions of profunda femoral vein and great saphenous vein unremarkable. No filling defects to suggest DVT on grayscale or color Doppler imaging. Doppler waveforms show normal direction of venous flow, normal respiratory plasticity and response to augmentation. IMPRESSION: Negative for DVT in the lower extremities. Electronically Signed   By: Jorje Guild M.D.   On: 11/21/2021 17:07     Assessment and plan- Patient is a 71 y.o. male with newly diagnosed R-ISS stage III high risk IgG lambda multiple myeloma.  For on treatment assessment prior to cycle 3-day 1 of Velcade  Counts okay to proceed with cycle 3-day 1 of Velcade.  He is presently getting Velcade at a weekly dose of 1.3 mg per metered square.  He is also on Revlimid10 mg 2 weeks on 1 week off based on his renal functions which she seems to be tolerating  without any significant side effects.  His hemoglobin is lower at 8.6 today and I will plan to restart his Retacrit at this time.  He did get Retacrit a week ago and will receive his Retacrit in 2 weeks.  I will see him back in 2 weeks for cycle 3-day 15 of Velcade with repeat myeloma labs  Continue acyclovir prophylaxis along with aspirin  Bilateral lower extremity swelling: Likely secondary to underlying CKD/hypoalbuminemia: I will obtain bilateral lower extremity ultrasound nevertheless   Visit Diagnosis 1. Multiple myeloma not having achieved remission (HCC)   2. Pain and swelling of lower leg, right   3. Encounter for antineoplastic chemotherapy   4. High risk medication use      Dr. Randa Evens, MD, MPH [facility] at [facility] [phone] 12/01/2021 11:42 AM

## 2021-12-05 ENCOUNTER — Ambulatory Visit: Payer: Medicare Other

## 2021-12-05 ENCOUNTER — Other Ambulatory Visit: Payer: Medicare Other

## 2021-12-05 ENCOUNTER — Ambulatory Visit: Payer: Medicare Other | Admitting: Oncology

## 2021-12-05 ENCOUNTER — Inpatient Hospital Stay: Payer: Medicare Other

## 2021-12-06 ENCOUNTER — Inpatient Hospital Stay
Admission: EM | Admit: 2021-12-06 | Discharge: 2021-12-09 | DRG: 917 | Disposition: A | Discharge status: Home / Self Care | Payer: Medicare Other | Attending: Internal Medicine | Admitting: Internal Medicine

## 2021-12-06 ENCOUNTER — Other Ambulatory Visit: Payer: Self-pay

## 2021-12-06 ENCOUNTER — Emergency Department: Payer: Medicare Other

## 2021-12-06 DIAGNOSIS — E7800 Pure hypercholesterolemia, unspecified: Secondary | ICD-10-CM | POA: Diagnosis present

## 2021-12-06 DIAGNOSIS — I4891 Unspecified atrial fibrillation: Secondary | ICD-10-CM | POA: Diagnosis present

## 2021-12-06 DIAGNOSIS — Z7902 Long term (current) use of antithrombotics/antiplatelets: Secondary | ICD-10-CM

## 2021-12-06 DIAGNOSIS — Z8673 Personal history of transient ischemic attack (TIA), and cerebral infarction without residual deficits: Secondary | ICD-10-CM

## 2021-12-06 DIAGNOSIS — N170 Acute kidney failure with tubular necrosis: Secondary | ICD-10-CM | POA: Diagnosis present

## 2021-12-06 DIAGNOSIS — Z91148 Patient's other noncompliance with medication regimen for other reason: Secondary | ICD-10-CM

## 2021-12-06 DIAGNOSIS — F1210 Cannabis abuse, uncomplicated: Secondary | ICD-10-CM | POA: Diagnosis present

## 2021-12-06 DIAGNOSIS — F1410 Cocaine abuse, uncomplicated: Secondary | ICD-10-CM | POA: Diagnosis present

## 2021-12-06 DIAGNOSIS — Z794 Long term (current) use of insulin: Secondary | ICD-10-CM

## 2021-12-06 DIAGNOSIS — E1110 Type 2 diabetes mellitus with ketoacidosis without coma: Secondary | ICD-10-CM | POA: Diagnosis present

## 2021-12-06 DIAGNOSIS — I10 Essential (primary) hypertension: Secondary | ICD-10-CM | POA: Diagnosis present

## 2021-12-06 DIAGNOSIS — R112 Nausea with vomiting, unspecified: Secondary | ICD-10-CM

## 2021-12-06 DIAGNOSIS — C9000 Multiple myeloma not having achieved remission: Secondary | ICD-10-CM | POA: Diagnosis present

## 2021-12-06 DIAGNOSIS — E785 Hyperlipidemia, unspecified: Secondary | ICD-10-CM | POA: Diagnosis present

## 2021-12-06 DIAGNOSIS — Y92009 Unspecified place in unspecified non-institutional (private) residence as the place of occurrence of the external cause: Secondary | ICD-10-CM

## 2021-12-06 DIAGNOSIS — I129 Hypertensive chronic kidney disease with stage 1 through stage 4 chronic kidney disease, or unspecified chronic kidney disease: Secondary | ICD-10-CM | POA: Diagnosis present

## 2021-12-06 DIAGNOSIS — E1010 Type 1 diabetes mellitus with ketoacidosis without coma: Secondary | ICD-10-CM | POA: Diagnosis present

## 2021-12-06 DIAGNOSIS — N1831 Chronic kidney disease, stage 3a: Secondary | ICD-10-CM | POA: Diagnosis present

## 2021-12-06 DIAGNOSIS — Z8249 Family history of ischemic heart disease and other diseases of the circulatory system: Secondary | ICD-10-CM

## 2021-12-06 DIAGNOSIS — E1022 Type 1 diabetes mellitus with diabetic chronic kidney disease: Secondary | ICD-10-CM | POA: Diagnosis present

## 2021-12-06 DIAGNOSIS — Z7982 Long term (current) use of aspirin: Secondary | ICD-10-CM

## 2021-12-06 DIAGNOSIS — F14129 Cocaine abuse with intoxication, unspecified: Secondary | ICD-10-CM | POA: Diagnosis present

## 2021-12-06 DIAGNOSIS — E1040 Type 1 diabetes mellitus with diabetic neuropathy, unspecified: Secondary | ICD-10-CM | POA: Diagnosis present

## 2021-12-06 DIAGNOSIS — Z91199 Patient's noncompliance with other medical treatment and regimen due to unspecified reason: Secondary | ICD-10-CM

## 2021-12-06 DIAGNOSIS — Z20822 Contact with and (suspected) exposure to covid-19: Secondary | ICD-10-CM | POA: Diagnosis present

## 2021-12-06 DIAGNOSIS — N179 Acute kidney failure, unspecified: Secondary | ICD-10-CM | POA: Diagnosis present

## 2021-12-06 DIAGNOSIS — U099 Post covid-19 condition, unspecified: Secondary | ICD-10-CM | POA: Diagnosis present

## 2021-12-06 DIAGNOSIS — E875 Hyperkalemia: Secondary | ICD-10-CM | POA: Diagnosis not present

## 2021-12-06 DIAGNOSIS — T405X1A Poisoning by cocaine, accidental (unintentional), initial encounter: Secondary | ICD-10-CM | POA: Diagnosis not present

## 2021-12-06 DIAGNOSIS — Z8711 Personal history of peptic ulcer disease: Secondary | ICD-10-CM

## 2021-12-06 DIAGNOSIS — Z880 Allergy status to penicillin: Secondary | ICD-10-CM

## 2021-12-06 DIAGNOSIS — Z7984 Long term (current) use of oral hypoglycemic drugs: Secondary | ICD-10-CM

## 2021-12-06 DIAGNOSIS — Z809 Family history of malignant neoplasm, unspecified: Secondary | ICD-10-CM

## 2021-12-06 DIAGNOSIS — D472 Monoclonal gammopathy: Secondary | ICD-10-CM | POA: Diagnosis present

## 2021-12-06 DIAGNOSIS — G9341 Metabolic encephalopathy: Secondary | ICD-10-CM | POA: Diagnosis present

## 2021-12-06 DIAGNOSIS — Z79899 Other long term (current) drug therapy: Secondary | ICD-10-CM

## 2021-12-06 DIAGNOSIS — D631 Anemia in chronic kidney disease: Secondary | ICD-10-CM | POA: Diagnosis present

## 2021-12-06 LAB — CBC WITH DIFFERENTIAL/PLATELET
Abs Immature Granulocytes: 0.04 10*3/uL (ref 0.00–0.07)
Basophils Absolute: 0 10*3/uL (ref 0.0–0.1)
Basophils Relative: 1 %
Eosinophils Absolute: 0 10*3/uL (ref 0.0–0.5)
Eosinophils Relative: 0 %
HCT: 33.9 % — ABNORMAL LOW (ref 39.0–52.0)
Hemoglobin: 9.5 g/dL — ABNORMAL LOW (ref 13.0–17.0)
Immature Granulocytes: 1 %
Lymphocytes Relative: 7 %
Lymphs Abs: 0.5 10*3/uL — ABNORMAL LOW (ref 0.7–4.0)
MCH: 30.3 pg (ref 26.0–34.0)
MCHC: 28 g/dL — ABNORMAL LOW (ref 30.0–36.0)
MCV: 108 fL — ABNORMAL HIGH (ref 80.0–100.0)
Monocytes Absolute: 1 10*3/uL (ref 0.1–1.0)
Monocytes Relative: 13 %
Neutro Abs: 6 10*3/uL (ref 1.7–7.7)
Neutrophils Relative %: 78 %
Platelets: 537 10*3/uL — ABNORMAL HIGH (ref 150–400)
RBC: 3.14 MIL/uL — ABNORMAL LOW (ref 4.22–5.81)
RDW: 15.9 % — ABNORMAL HIGH (ref 11.5–15.5)
WBC: 7.6 10*3/uL (ref 4.0–10.5)
nRBC: 0 % (ref 0.0–0.2)

## 2021-12-06 MED ORDER — LACTATED RINGERS IV BOLUS
1000.0000 mL | Freq: Once | INTRAVENOUS | Status: AC
  Administered 2021-12-07: 1000 mL via INTRAVENOUS

## 2021-12-06 MED ORDER — FAMOTIDINE IN NACL 20-0.9 MG/50ML-% IV SOLN
20.0000 mg | Freq: Once | INTRAVENOUS | Status: AC
  Administered 2021-12-07: 20 mg via INTRAVENOUS
  Filled 2021-12-06: qty 50

## 2021-12-06 MED ORDER — ONDANSETRON HCL 4 MG/2ML IJ SOLN
4.0000 mg | Freq: Once | INTRAMUSCULAR | Status: AC
  Administered 2021-12-07: 4 mg via INTRAVENOUS
  Filled 2021-12-06: qty 2

## 2021-12-06 NOTE — ED Notes (Signed)
ED Provider at bedside. 

## 2021-12-06 NOTE — ED Triage Notes (Signed)
Pt was recently seen in the ER 11/26/21 for DKA, partner sts she the symptoms are the same, vomiting and hyperglycemia.

## 2021-12-06 NOTE — ED Provider Notes (Signed)
[facility] Provider Note    Event Date/Time   First MD Initiated Contact with Patient 12/06/21 2321     (approximate)   History   Hyperglycemia   HPI  Level V caveat: Limited by altered mentation  Nicholas Weiss is a 71 y.o. male who presents to the ED from home with a chief complaint of nausea, vomiting and hyperglycemia.  Patient is a poorly controlled diabetic who was recently admitted for same.  He was also diagnosed with COVID-19 infection 11/26/2021.  Family member states he was never back to his usual state of health and began to get worse today.  Endorses nausea/vomiting and generalized weakness.  Denies fever, cough, chest pain, shortness of breath, dysuria or diarrhea.     Past Medical History   Past Medical History:  Diagnosis Date   DKA (diabetic ketoacidoses) 04/06/2016   Hypercholesteremia    Hypertension      Active Problem List   Patient Active Problem List   Diagnosis Date Noted   DKA (diabetic ketoacidosis) ([place]) 11/27/2021   Stroke (cerebrum) ([place]) 11/07/2021   Multiple myeloma ([place]) 09/29/2021   Multiple myeloma not having achieved remission ([place]) 09/29/2021   Chronic hyponatremia 08/18/2021   COVID-19 virus detected 08/13/2021   History of CVA (cerebrovascular accident) 08/13/2021   Syncope and collapse 08/09/2021   Generalized abdominal pain 08/05/2021   Hepatic lesion 08/05/2021   Dyslipidemia 05/08/2021   History of substance abuse ([place]) 57/26/2035   Acute metabolic encephalopathy 59/74/1638   Cocaine abuse ([place]) 11/30/2020   Anemia of chronic disease 11/08/2020   Muscle twitching 09/12/2020   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   Pre-syncope 08/01/2020   Heart block AV first degree 08/01/2020   Anemia of chronic kidney failure, stage 3 (moderate) ([place]) 05/28/2020   PUD (peptic ulcer disease)    Esophageal dysphagia    Encounter for screening colonoscopy    Generalized weakness 07/28/2019   Hypoglycemia  04/27/2019   Unresponsiveness 04/27/2019   Lung nodule 04/07/2019   Uncontrolled type 1 diabetes mellitus    Acute renal failure with acute tubular necrosis superimposed on stage 3a chronic kidney disease ([place])    Hypertensive urgency 04/05/2019   AKI (acute kidney injury) ([place]) 04/05/2019   CKD (chronic kidney disease) stage 3, GFR 30-59 ml/min ([place]) 04/05/2019   DKA (diabetic ketoacidoses) 04/06/2016   Hypercholesterolemia 01/24/2016   Essential (primary) hypertension 06/07/2006   Type 1 diabetes mellitus with diabetic neuropathy, unspecified ([place]) 05/31/2006     Past Surgical History   Past Surgical History:  Procedure Laterality Date   COLONOSCOPY WITH PROPOFOL N/A 02/01/2020   Procedure: COLONOSCOPY WITH PROPOFOL;  Surgeon: Lin Landsman, MD;  Location: [facility] ENDOSCOPY;  Service: Gastroenterology;  Laterality: N/A;   ESOPHAGOGASTRODUODENOSCOPY  02/01/2020   Procedure: ESOPHAGOGASTRODUODENOSCOPY (EGD);  Surgeon: Lin Landsman, MD;  Location: [facility] ENDOSCOPY;  Service: Gastroenterology;;   KNEE SURGERY Right    Torn meniscus   KNEE SURGERY Left      Home Medications   Prior to Admission medications   Medication Sig Start Date End Date Taking? Authorizing Provider  acyclovir (ZOVIRAX) 400 MG tablet Take 1 tablet (400 mg total) by mouth 2 (two) times daily. 11/21/21  Yes Sindy Guadeloupe, MD  Alcohol Swabs (B-D SINGLE USE SWABS REGULAR) PADS Use to check blood sugar four times daily for type 1 diabetes 07/11/20  Yes Birdie Sons, MD  amLODipine (NORVASC) 10 MG tablet Take 1 tablet (10 mg total) by mouth daily.  11/28/21 02/26/22 Yes Enzo Bi, MD  ASPIRIN 81 PO Take 81 mg by mouth daily.   Yes [provider]  atorvastatin (LIPITOR) 80 MG tablet Take 1 tablet (80 mg total) by mouth at bedtime. 08/11/21  Yes Shawna Clamp, MD  baclofen 5 MG TABS Take 5 mg by mouth 3 (three) times daily as needed for muscle spasms. 11/07/21  Yes Vaslow, Acey Lav, MD  Blood Glucose  Calibration (TRUE METRIX LEVEL 1) Low SOLN Use to check blood sugar four times daily for type 1 diabetes 07/11/20  Yes Fisher, Kirstie Peri, MD  FARXIGA 10 MG TABS tablet Take 10 mg by mouth daily. 05/05/21  Yes [provider]  gabapentin (NEURONTIN) 300 MG capsule Take 300 mg by mouth in the morning and 600 mg at night. 04/04/21  Yes [provider]  insulin degludec (TRESIBA FLEXTOUCH) 100 UNIT/ML FlexTouch Pen Inject 20 Units into the skin daily. Home dose. 11/28/21  Yes Enzo Bi, MD  Insulin Syringe-Needle U-100 (INSULIN SYRINGE 1CC/31GX5/16") 31G X 5/16" 1 ML MISC For insulin injections up to 4 times daily 07/11/20  Yes Fisher, Kirstie Peri, MD  lenalidomide (REVLIMID) 10 MG capsule TAKE 1 CAPSULE BY MOUTH EVERY DAY FOR 14 DAYS THEN HOLD FOR 7 DAYS. REPEAT EVERY 21 DAYS. 11/26/21  Yes Sindy Guadeloupe, MD  lisinopril (ZESTRIL) 10 MG tablet Take 1 tablet (10 mg total) by mouth daily. 11/28/21 02/26/22 Yes Enzo Bi, MD  NOVOLOG 100 UNIT/ML injection INJECT UP TO 14 UNITS THREE TIMES DAILY BEFORE MEALS ACCORDING TO SLIDING SCALE 09/09/21  Yes Birdie Sons, MD  prochlorperazine (COMPAZINE) 10 MG tablet Take 1 tablet (10 mg total) by mouth every 6 (six) hours as needed (Nausea or vomiting). Patient not taking: Reported on 10/17/2021 09/29/21 11/21/21  Sindy Guadeloupe, MD     Allergies  Penicillins   Family History   Family History  Problem Relation Age of Onset   Heart attack Father    Hypertension Sister    Cancer Sister      Physical Exam  Triage Vital Signs: ED Triage Vitals  Enc Vitals Group     BP      Pulse      Resp      Temp      Temp src      SpO2      Weight      Height      Head Circumference      Peak Flow      Pain Score      Pain Loc      Pain Edu?      Excl. in [place]?     Updated Vital Signs: BP 112/62   Pulse 94   Temp (!) 96.5 F (35.8 C) (Rectal)   Resp (!) 26   SpO2 100%    General: Awake, moderate distress.  CV:  RRR.  Good peripheral perfusion.   Resp:  Increased effort.  CTA B. Abd:  Mild tenderness to epigastrium without rebound or guarding.  No distention.  Other:  Mildly dry mucous membranes.  Moaning.   ED Results / Procedures / Treatments  Labs (all labs ordered are listed, but only abnormal results are displayed) Labs Reviewed  CBC WITH DIFFERENTIAL/PLATELET - Abnormal; Notable for the following components:      Result Value   RBC 3.14 (*)    Hemoglobin 9.5 (*)    HCT 33.9 (*)    MCV 108.0 (*)    MCHC  28.0 (*)    RDW 15.9 (*)    Platelets 537 (*)    Lymphs Abs 0.5 (*)    All other components within normal limits  COMPREHENSIVE METABOLIC PANEL - Abnormal; Notable for the following components:   Potassium 6.9 (*)    CO2 <7 (*)    Glucose, Bld 805 (*)    BUN 63 (*)    Creatinine, Ser 3.28 (*)    Calcium 8.7 (*)    Albumin 3.4 (*)    GFR, Estimated 20 (*)    All other components within normal limits  BLOOD GAS, VENOUS - Abnormal; Notable for the following components:   pH, Ven 7.02 (*)    pCO2, Ven <18 (*)    pO2, Ven 59 (*)    Bicarbonate 4.4 (*)    Acid-base deficit 24.9 (*)    All other components within normal limits  TROPONIN I (HIGH SENSITIVITY) - Abnormal; Notable for the following components:   Troponin I (High Sensitivity) 41 (*)    All other components within normal limits  ETHANOL  LIPASE, BLOOD  URINALYSIS, ROUTINE W REFLEX MICROSCOPIC  BETA-HYDROXYBUTYRIC ACID     EKG  ED ECG REPORT I, Zhane Donlan J, the attending physician, personally viewed and interpreted this ECG.   Date: 12/06/2021  EKG Time: 2335  Rate: 91  Rhythm: normal sinus rhythm  Axis: Normal  Intervals: QTc 514  ST&T Change: Hyperacute T waves   RADIOLOGY I have independently visualized and interpreted patient's chest x-ray as well as noted the radiology interpretation:  X-ray: No active disease  Official radiology report(s): DG Chest Port 1 View  Result Date: 12/06/2021 CLINICAL DATA:  Hyperglycemia and vomiting  EXAM: PORTABLE CHEST 1 VIEW COMPARISON:  Radiographs 11/26/2021 FINDINGS: No focal consolidation, pleural effusion, or pneumothorax. Normal cardiomediastinal silhouette. No acute osseous abnormality. Aortic calcification. IMPRESSION: No active disease. Aortic Atherosclerosis (ICD10-I70.0). Electronically Signed   By: Placido Sou M.D.   On: 12/06/2021 23:51     PROCEDURES:  Critical Care performed: Yes, see critical care procedure note(s)  CRITICAL CARE Performed by: Paulette Blanch   Total critical care time: 45 minutes  Critical care time was exclusive of separately billable procedures and treating other patients.  Critical care was necessary to treat or prevent imminent or life-threatening deterioration.  Critical care was time spent personally by me on the following activities: development of treatment plan with patient and/or surrogate as well as nursing, discussions with consultants, evaluation of patient's response to treatment, examination of patient, obtaining history from patient or surrogate, ordering and performing treatments and interventions, ordering and review of laboratory studies, ordering and review of radiographic studies, pulse oximetry and re-evaluation of patient's condition.   Marland Kitchen1-3 Lead EKG Interpretation  Performed by: Paulette Blanch, MD Authorized by: Paulette Blanch, MD     Interpretation: normal     ECG rate:  95   ECG rate assessment: normal     Rhythm: sinus rhythm     Ectopy: none     Conduction: normal   Comments:     Patient placed on cardiac monitor to evaluate for arrhythmias    MEDICATIONS ORDERED IN ED: Medications  famotidine (PEPCID) IVPB 20 mg premix (20 mg Intravenous New Bag/Given 12/07/21 0012)  sodium bicarbonate injection 50 mEq (has no administration in time range)  lactated ringers bolus 1,350 mL (has no administration in time range)  insulin regular, human (MYXREDLIN) 100 units/ 100 mL infusion (has no administration in time range)  lactated ringers infusion (has no administration in time range)  dextrose 5 % in lactated ringers infusion (has no administration in time range)  dextrose 50 % solution 0-50 mL (has no administration in time range)  albuterol (PROVENTIL) (2.5 MG/3ML) 0.083% nebulizer solution 2.5 mg (has no administration in time range)  patiromer Daryll Drown) packet 8.4 g (has no administration in time range)  lactated ringers bolus 1,000 mL (1,000 mLs Intravenous New Bag/Given 12/07/21 0014)  ondansetron (ZOFRAN) injection 4 mg (4 mg Intravenous Given 12/07/21 0010)     IMPRESSION / MDM / ASSESSMENT AND PLAN / ED COURSE  I reviewed the triage vital signs and the nursing notes.                             71 year old male presenting with hyperglycemia, nausea and vomiting.  Differential diagnosis includes but is not limited to DKA, infectious, metabolic etiologies, etc.  I have personally reviewed patient's records and note his hospitalization 8/30 - 11/28/2021 where he required initial ICU stay.  Patient's presentation is most consistent with acute presentation with potential threat to life or bodily function.  The patient is on the cardiac monitor to evaluate for evidence of arrhythmia and/or significant heart rate changes.  Will obtain labwork, initiate IV fluid resuscitation, VBG, beta hydroxybutyrate.  Anticipate hospitalization.  Clinical Course as of 12/07/21 0036  Sun Dec 07, 2021  0006 VBG pH 7.02/<18/59/4.4; will administer 1 amp sodium bicarbonate.  Anticipate admission to CCU. [JS]  8346 Verbal from lab glucose greater than 800, potassium 6.9.  Will start Endo tool and hyperkalemia cocktail.  Will discuss with CCU intensivist for admission. [JS]  0034 Anion gap is unable to be calculated.  Troponin is minimally elevated at 41 which is likely secondary to demand ischemia.  I have spoken with NP Ouma from CCU who will evaluate patient at bedside.  He is already getting 1 amp of sodium bicarbonate and  starting on insulin drip.  Not sure if he will tolerate [place] given complaints of vomiting.  Will administer albuterol nebulizer to shift potassium.  Discussed with intensivist who does not want second amp of sodium bicarbonate. [JS]    Clinical Course User Index [JS] Paulette Blanch, MD     FINAL CLINICAL IMPRESSION(S) / ED DIAGNOSES   Final diagnoses:  Diabetic ketoacidosis without coma associated with type 1 diabetes mellitus ([place])  Hyperkalemia  Nausea and vomiting, unspecified vomiting type     Rx / DC Orders   ED Discharge Orders     None        Note:  This document was prepared using Dragon voice recognition software and may include unintentional dictation errors.   Paulette Blanch, MD 12/07/21 [phone]

## 2021-12-07 ENCOUNTER — Encounter: Payer: Self-pay | Admitting: Internal Medicine

## 2021-12-07 DIAGNOSIS — I129 Hypertensive chronic kidney disease with stage 1 through stage 4 chronic kidney disease, or unspecified chronic kidney disease: Secondary | ICD-10-CM | POA: Diagnosis present

## 2021-12-07 DIAGNOSIS — T405X2D Poisoning by cocaine, intentional self-harm, subsequent encounter: Secondary | ICD-10-CM

## 2021-12-07 DIAGNOSIS — R112 Nausea with vomiting, unspecified: Secondary | ICD-10-CM

## 2021-12-07 DIAGNOSIS — E1022 Type 1 diabetes mellitus with diabetic chronic kidney disease: Secondary | ICD-10-CM | POA: Diagnosis present

## 2021-12-07 DIAGNOSIS — I4891 Unspecified atrial fibrillation: Secondary | ICD-10-CM | POA: Diagnosis present

## 2021-12-07 DIAGNOSIS — Z20822 Contact with and (suspected) exposure to covid-19: Secondary | ICD-10-CM | POA: Diagnosis present

## 2021-12-07 DIAGNOSIS — G9341 Metabolic encephalopathy: Secondary | ICD-10-CM | POA: Diagnosis present

## 2021-12-07 DIAGNOSIS — E7800 Pure hypercholesterolemia, unspecified: Secondary | ICD-10-CM | POA: Diagnosis present

## 2021-12-07 DIAGNOSIS — Z7902 Long term (current) use of antithrombotics/antiplatelets: Secondary | ICD-10-CM | POA: Diagnosis not present

## 2021-12-07 DIAGNOSIS — Z8673 Personal history of transient ischemic attack (TIA), and cerebral infarction without residual deficits: Secondary | ICD-10-CM | POA: Diagnosis not present

## 2021-12-07 DIAGNOSIS — C9000 Multiple myeloma not having achieved remission: Secondary | ICD-10-CM | POA: Diagnosis present

## 2021-12-07 DIAGNOSIS — T405X1A Poisoning by cocaine, accidental (unintentional), initial encounter: Secondary | ICD-10-CM | POA: Diagnosis present

## 2021-12-07 DIAGNOSIS — E1040 Type 1 diabetes mellitus with diabetic neuropathy, unspecified: Secondary | ICD-10-CM | POA: Diagnosis present

## 2021-12-07 DIAGNOSIS — U099 Post covid-19 condition, unspecified: Secondary | ICD-10-CM | POA: Diagnosis present

## 2021-12-07 DIAGNOSIS — Z8249 Family history of ischemic heart disease and other diseases of the circulatory system: Secondary | ICD-10-CM | POA: Diagnosis not present

## 2021-12-07 DIAGNOSIS — E1010 Type 1 diabetes mellitus with ketoacidosis without coma: Secondary | ICD-10-CM | POA: Diagnosis present

## 2021-12-07 DIAGNOSIS — Z794 Long term (current) use of insulin: Secondary | ICD-10-CM | POA: Diagnosis not present

## 2021-12-07 DIAGNOSIS — N1831 Chronic kidney disease, stage 3a: Secondary | ICD-10-CM | POA: Diagnosis present

## 2021-12-07 DIAGNOSIS — F14129 Cocaine abuse with intoxication, unspecified: Secondary | ICD-10-CM | POA: Diagnosis present

## 2021-12-07 DIAGNOSIS — D472 Monoclonal gammopathy: Secondary | ICD-10-CM | POA: Diagnosis present

## 2021-12-07 DIAGNOSIS — D631 Anemia in chronic kidney disease: Secondary | ICD-10-CM | POA: Diagnosis present

## 2021-12-07 DIAGNOSIS — Y92009 Unspecified place in unspecified non-institutional (private) residence as the place of occurrence of the external cause: Secondary | ICD-10-CM | POA: Diagnosis not present

## 2021-12-07 DIAGNOSIS — Z7982 Long term (current) use of aspirin: Secondary | ICD-10-CM | POA: Diagnosis not present

## 2021-12-07 DIAGNOSIS — E875 Hyperkalemia: Secondary | ICD-10-CM | POA: Diagnosis present

## 2021-12-07 DIAGNOSIS — N170 Acute kidney failure with tubular necrosis: Secondary | ICD-10-CM | POA: Diagnosis present

## 2021-12-07 DIAGNOSIS — Z809 Family history of malignant neoplasm, unspecified: Secondary | ICD-10-CM | POA: Diagnosis not present

## 2021-12-07 LAB — COMPREHENSIVE METABOLIC PANEL
ALT: 23 U/L (ref 0–44)
AST: 32 U/L (ref 15–41)
Albumin: 3.4 g/dL — ABNORMAL LOW (ref 3.5–5.0)
Alkaline Phosphatase: 78 U/L (ref 38–126)
BUN: 63 mg/dL — ABNORMAL HIGH (ref 8–23)
CO2: <7 mmol/L — ABNORMAL LOW (ref 22–32)
Calcium: 8.7 mg/dL — ABNORMAL LOW (ref 8.9–10.3)
Chloride: 98 mmol/L (ref 98–111)
Creatinine, Ser: 3.28 mg/dL — ABNORMAL HIGH (ref 0.61–1.24)
GFR, Estimated: 20 mL/min — ABNORMAL LOW (ref >60)
Glucose, Bld: 805 mg/dL (ref 70–99)
Potassium: 6.9 mmol/L (ref 3.5–5.1)
Sodium: 135 mmol/L (ref 135–145)
Total Bilirubin: 0.5 mg/dL (ref 0.3–1.2)
Total Protein: 8.1 g/dL (ref 6.5–8.1)

## 2021-12-07 LAB — BASIC METABOLIC PANEL
Anion gap: 25 — ABNORMAL HIGH (ref 5–15)
Anion gap: 17 — ABNORMAL HIGH (ref 5–15)
Anion gap: 4 — ABNORMAL LOW (ref 5–15)
Anion gap: 9 (ref 5–15)
BUN: 62 mg/dL — ABNORMAL HIGH (ref 8–23)
BUN: 58 mg/dL — ABNORMAL HIGH (ref 8–23)
BUN: 51 mg/dL — ABNORMAL HIGH (ref 8–23)
BUN: 50 mg/dL — ABNORMAL HIGH (ref 8–23)
CO2: 9 mmol/L — ABNORMAL LOW (ref 22–32)
CO2: 17 mmol/L — ABNORMAL LOW (ref 22–32)
CO2: 26 mmol/L (ref 22–32)
CO2: 24 mmol/L (ref 22–32)
Calcium: 8.4 mg/dL — ABNORMAL LOW (ref 8.9–10.3)
Calcium: 8.4 mg/dL — ABNORMAL LOW (ref 8.9–10.3)
Calcium: 8.7 mg/dL — ABNORMAL LOW (ref 8.9–10.3)
Calcium: 8.7 mg/dL — ABNORMAL LOW (ref 8.9–10.3)
Chloride: 103 mmol/L (ref 98–111)
Chloride: 107 mmol/L (ref 98–111)
Chloride: 112 mmol/L — ABNORMAL HIGH (ref 98–111)
Chloride: 111 mmol/L (ref 98–111)
Creatinine, Ser: 3.1 mg/dL — ABNORMAL HIGH (ref 0.61–1.24)
Creatinine, Ser: 2.81 mg/dL — ABNORMAL HIGH (ref 0.61–1.24)
Creatinine, Ser: 2.24 mg/dL — ABNORMAL HIGH (ref 0.61–1.24)
Creatinine, Ser: 2.26 mg/dL — ABNORMAL HIGH (ref 0.61–1.24)
GFR, Estimated: 21 mL/min — ABNORMAL LOW (ref >60)
GFR, Estimated: 24 mL/min — ABNORMAL LOW (ref >60)
GFR, Estimated: 31 mL/min — ABNORMAL LOW (ref >60)
GFR, Estimated: 31 mL/min — ABNORMAL LOW (ref >60)
Glucose, Bld: 763 mg/dL (ref 70–99)
Glucose, Bld: 535 mg/dL (ref 70–99)
Glucose, Bld: 169 mg/dL — ABNORMAL HIGH (ref 70–99)
Glucose, Bld: 153 mg/dL — ABNORMAL HIGH (ref 70–99)
Potassium: 4.8 mmol/L (ref 3.5–5.1)
Potassium: 3.9 mmol/L (ref 3.5–5.1)
Potassium: 3.6 mmol/L (ref 3.5–5.1)
Potassium: 4.5 mmol/L (ref 3.5–5.1)
Sodium: 137 mmol/L (ref 135–145)
Sodium: 141 mmol/L (ref 135–145)
Sodium: 142 mmol/L (ref 135–145)
Sodium: 144 mmol/L (ref 135–145)

## 2021-12-07 LAB — URINALYSIS, ROUTINE W REFLEX MICROSCOPIC
Bacteria, UA: NONE SEEN
Bilirubin Urine: NEGATIVE
Glucose, UA: >=500 mg/dL — AB
Hgb urine dipstick: NEGATIVE
Ketones, ur: 5 mg/dL — AB
Leukocytes,Ua: NEGATIVE
Nitrite: NEGATIVE
Protein, ur: NEGATIVE mg/dL
Specific Gravity, Urine: 1.02 (ref 1.005–1.030)
pH: 5 (ref 5.0–8.0)

## 2021-12-07 LAB — GLUCOSE, CAPILLARY
Glucose-Capillary: 108 mg/dL — ABNORMAL HIGH (ref 70–99)
Glucose-Capillary: 132 mg/dL — ABNORMAL HIGH (ref 70–99)
Glucose-Capillary: 142 mg/dL — ABNORMAL HIGH (ref 70–99)
Glucose-Capillary: 150 mg/dL — ABNORMAL HIGH (ref 70–99)
Glucose-Capillary: 151 mg/dL — ABNORMAL HIGH (ref 70–99)
Glucose-Capillary: 185 mg/dL — ABNORMAL HIGH (ref 70–99)
Glucose-Capillary: 210 mg/dL — ABNORMAL HIGH (ref 70–99)
Glucose-Capillary: 238 mg/dL — ABNORMAL HIGH (ref 70–99)
Glucose-Capillary: 281 mg/dL — ABNORMAL HIGH (ref 70–99)
Glucose-Capillary: 348 mg/dL — ABNORMAL HIGH (ref 70–99)
Glucose-Capillary: 401 mg/dL — ABNORMAL HIGH (ref 70–99)
Glucose-Capillary: 402 mg/dL — ABNORMAL HIGH (ref 70–99)
Glucose-Capillary: 435 mg/dL — ABNORMAL HIGH (ref 70–99)
Glucose-Capillary: 497 mg/dL — ABNORMAL HIGH (ref 70–99)
Glucose-Capillary: 499 mg/dL — ABNORMAL HIGH (ref 70–99)
Glucose-Capillary: 526 mg/dL (ref 70–99)
Glucose-Capillary: 572 mg/dL (ref 70–99)
Glucose-Capillary: 598 mg/dL (ref 70–99)
Glucose-Capillary: 97 mg/dL (ref 70–99)
Glucose-Capillary: >600 mg/dL (ref 70–99)
Glucose-Capillary: >600 mg/dL (ref 70–99)
Glucose-Capillary: >600 mg/dL (ref 70–99)
Glucose-Capillary: >600 mg/dL (ref 70–99)

## 2021-12-07 LAB — TROPONIN I (HIGH SENSITIVITY)
Troponin I (High Sensitivity): 41 ng/L — ABNORMAL HIGH (ref <18)
Troponin I (High Sensitivity): 81 ng/L — ABNORMAL HIGH (ref <18)

## 2021-12-07 LAB — ETHANOL: Alcohol, Ethyl (B): <10 mg/dL (ref <10)

## 2021-12-07 LAB — BLOOD GAS, VENOUS
Acid-base deficit: 24.9 mmol/L — ABNORMAL HIGH (ref 0.0–2.0)
Bicarbonate: 4.4 mmol/L — ABNORMAL LOW (ref 20.0–28.0)
O2 Saturation: 76.4 %
Patient temperature: 37
pCO2, Ven: <18 mmHg — CL (ref 44–60)
pH, Ven: 7.02 — CL (ref 7.25–7.43)
pO2, Ven: 59 mmHg — ABNORMAL HIGH (ref 32–45)

## 2021-12-07 LAB — URINE DRUG SCREEN, QUALITATIVE (ARMC ONLY)
Amphetamines, Ur Screen: NOT DETECTED
Barbiturates, Ur Screen: NOT DETECTED
Benzodiazepine, Ur Scrn: NOT DETECTED
Cannabinoid 50 Ng, Ur ~~LOC~~: POSITIVE — AB
Cocaine Metabolite,Ur ~~LOC~~: POSITIVE — AB
MDMA (Ecstasy)Ur Screen: NOT DETECTED
Methadone Scn, Ur: NOT DETECTED
Opiate, Ur Screen: NOT DETECTED
Phencyclidine (PCP) Ur S: NOT DETECTED
Tricyclic, Ur Screen: NOT DETECTED

## 2021-12-07 LAB — CBC
HCT: 28.5 % — ABNORMAL LOW (ref 39.0–52.0)
Hemoglobin: 8.4 g/dL — ABNORMAL LOW (ref 13.0–17.0)
MCH: 31.1 pg (ref 26.0–34.0)
MCHC: 29.5 g/dL — ABNORMAL LOW (ref 30.0–36.0)
MCV: 105.6 fL — ABNORMAL HIGH (ref 80.0–100.0)
Platelets: 406 10*3/uL — ABNORMAL HIGH (ref 150–400)
RBC: 2.7 MIL/uL — ABNORMAL LOW (ref 4.22–5.81)
RDW: 15.7 % — ABNORMAL HIGH (ref 11.5–15.5)
WBC: 6.6 10*3/uL (ref 4.0–10.5)
nRBC: 0 % (ref 0.0–0.2)

## 2021-12-07 LAB — CBG MONITORING, ED
Glucose-Capillary: >600 mg/dL (ref 70–99)
Glucose-Capillary: >600 mg/dL (ref 70–99)

## 2021-12-07 LAB — LACTIC ACID, PLASMA
Lactic Acid, Venous: 8.3 mmol/L (ref 0.5–1.9)
Lactic Acid, Venous: 6 mmol/L (ref 0.5–1.9)
Lactic Acid, Venous: 2.8 mmol/L (ref 0.5–1.9)
Lactic Acid, Venous: 2.1 mmol/L (ref 0.5–1.9)

## 2021-12-07 LAB — MAGNESIUM: Magnesium: 2.8 mg/dL — ABNORMAL HIGH (ref 1.7–2.4)

## 2021-12-07 LAB — BETA-HYDROXYBUTYRIC ACID
Beta-Hydroxybutyric Acid: 4.03 mmol/L — ABNORMAL HIGH (ref 0.05–0.27)
Beta-Hydroxybutyric Acid: <0.05 mmol/L — ABNORMAL LOW (ref 0.05–0.27)
Beta-Hydroxybutyric Acid: >8 mmol/L — ABNORMAL HIGH (ref 0.05–0.27)

## 2021-12-07 LAB — LIPASE, BLOOD: Lipase: 31 U/L (ref 11–51)

## 2021-12-07 LAB — TSH: TSH: 3.743 u[IU]/mL (ref 0.350–4.500)

## 2021-12-07 LAB — MRSA NEXT GEN BY PCR, NASAL: MRSA by PCR Next Gen: NOT DETECTED

## 2021-12-07 LAB — T4, FREE: Free T4: 0.97 ng/dL (ref 0.61–1.12)

## 2021-12-07 LAB — PHOSPHORUS: Phosphorus: 10.4 mg/dL — ABNORMAL HIGH (ref 2.5–4.6)

## 2021-12-07 MED ORDER — INSULIN ASPART 100 UNIT/ML IJ SOLN: 10.0000 [IU] | Freq: Once | INTRAMUSCULAR | Status: DC

## 2021-12-07 MED ORDER — AMLODIPINE BESYLATE 10 MG PO TABS
10.0000 mg | ORAL_TABLET | Freq: Every day | ORAL | Status: DC
  Administered 2021-12-07 – 2021-12-09 (×3): 10 mg via ORAL
  Filled 2021-12-07 (×3): qty 1

## 2021-12-07 MED ORDER — DEXTROSE 50 % IV SOLN: 0.0000 mL | INTRAVENOUS | Status: DC | PRN

## 2021-12-07 MED ORDER — LACTATED RINGERS IV BOLUS
1000.0000 mL | Freq: Once | INTRAVENOUS | Status: AC
  Administered 2021-12-07: 1000 mL via INTRAVENOUS

## 2021-12-07 MED ORDER — CHLORHEXIDINE GLUCONATE CLOTH 2 % EX PADS: 6.0000 | MEDICATED_PAD | Freq: Every day | CUTANEOUS | Status: DC

## 2021-12-07 MED ORDER — INSULIN ASPART 100 UNIT/ML IJ SOLN
0.0000 [IU] | INTRAMUSCULAR | Status: DC
  Administered 2021-12-07: 2 [IU] via SUBCUTANEOUS
  Administered 2021-12-07 – 2021-12-08 (×3): 1 [IU] via SUBCUTANEOUS
  Administered 2021-12-08: 2 [IU] via SUBCUTANEOUS
  Administered 2021-12-08: 3 [IU] via SUBCUTANEOUS
  Filled 2021-12-07 (×6): qty 1

## 2021-12-07 MED ORDER — POLYETHYLENE GLYCOL 3350 17 G PO PACK: 17.0000 g | PACK | Freq: Every day | ORAL | Status: DC | PRN

## 2021-12-07 MED ORDER — LACTATED RINGERS IV BOLUS
20.0000 mL/kg | Freq: Once | INTRAVENOUS | Status: AC
Start: 2021-12-07 — End: 2021-12-07
  Administered 2021-12-07: 1350 mL via INTRAVENOUS

## 2021-12-07 MED ORDER — INSULIN GLARGINE-YFGN 100 UNIT/ML ~~LOC~~ SOLN
15.0000 [IU] | Freq: Every day | SUBCUTANEOUS | Status: DC
  Filled 2021-12-07: qty 0.15

## 2021-12-07 MED ORDER — INSULIN REGULAR(HUMAN) IN NACL 100-0.9 UT/100ML-% IV SOLN
INTRAVENOUS | Status: DC
Start: 2021-12-07 — End: 2021-12-07

## 2021-12-07 MED ORDER — CALCIUM GLUCONATE-NACL 1-0.675 GM/50ML-% IV SOLN
1.0000 g | Freq: Once | INTRAVENOUS | Status: AC
  Administered 2021-12-07: 1000 mg via INTRAVENOUS
  Filled 2021-12-07: qty 50

## 2021-12-07 MED ORDER — INSULIN REGULAR(HUMAN) IN NACL 100-0.9 UT/100ML-% IV SOLN
INTRAVENOUS | Status: DC
  Administered 2021-12-07: 6 [IU]/h via INTRAVENOUS
  Administered 2021-12-07: 18 [IU]/h via INTRAVENOUS
  Administered 2021-12-07: 10.5 [IU]/h via INTRAVENOUS
  Filled 2021-12-07 (×3): qty 100

## 2021-12-07 MED ORDER — ALBUTEROL SULFATE (2.5 MG/3ML) 0.083% IN NEBU
2.5000 mg | INHALATION_SOLUTION | Freq: Once | RESPIRATORY_TRACT | Status: AC
  Administered 2021-12-07: 2.5 mg via RESPIRATORY_TRACT
  Filled 2021-12-07: qty 3

## 2021-12-07 MED ORDER — INSULIN GLARGINE-YFGN 100 UNIT/ML ~~LOC~~ SOLN
15.0000 [IU] | Freq: Every day | SUBCUTANEOUS | Status: DC
  Administered 2021-12-08: 15 [IU] via SUBCUTANEOUS
  Filled 2021-12-07: qty 0.15

## 2021-12-07 MED ORDER — INSULIN GLARGINE-YFGN 100 UNIT/ML ~~LOC~~ SOLN
5.0000 [IU] | Freq: Once | SUBCUTANEOUS | Status: AC
  Administered 2021-12-07: 5 [IU] via SUBCUTANEOUS
  Filled 2021-12-07 (×2): qty 0.05

## 2021-12-07 MED ORDER — INSULIN ASPART 100 UNIT/ML IV SOLN
10.0000 [IU] | Freq: Once | INTRAVENOUS | Status: AC
  Administered 2021-12-07: 10 [IU] via INTRAVENOUS
  Filled 2021-12-07: qty 0.1

## 2021-12-07 MED ORDER — SODIUM BICARBONATE 8.4 % IV SOLN
50.0000 meq | Freq: Once | INTRAVENOUS | Status: AC
  Administered 2021-12-07: 50 meq via INTRAVENOUS
  Filled 2021-12-07: qty 50

## 2021-12-07 MED ORDER — HEPARIN SODIUM (PORCINE) 5000 UNIT/ML IJ SOLN
5000.0000 [IU] | Freq: Three times a day (TID) | INTRAMUSCULAR | Status: DC
  Administered 2021-12-07 – 2021-12-09 (×7): 5000 [IU] via SUBCUTANEOUS
  Filled 2021-12-07 (×7): qty 1

## 2021-12-07 MED ORDER — DEXTROSE IN LACTATED RINGERS 5 % IV SOLN: INTRAVENOUS | Status: DC

## 2021-12-07 MED ORDER — PATIROMER SORBITEX CALCIUM 8.4 G PO PACK
8.4000 g | PACK | Freq: Every day | ORAL | Status: DC
  Administered 2021-12-07: 8.4 g via ORAL
  Filled 2021-12-07 (×2): qty 1

## 2021-12-07 MED ORDER — INSULIN ASPART 100 UNIT/ML IJ SOLN: 0.0000 [IU] | INTRAMUSCULAR | Status: DC

## 2021-12-07 MED ORDER — DOCUSATE SODIUM 100 MG PO CAPS: 100.0000 mg | ORAL_CAPSULE | Freq: Two times a day (BID) | ORAL | Status: DC | PRN

## 2021-12-07 MED ORDER — LACTATED RINGERS IV SOLN: INTRAVENOUS | Status: DC

## 2021-12-07 NOTE — Plan of Care (Signed)
Continuing with plan of care. 

## 2021-12-07 NOTE — H&P (Addendum)
NAME:  Nicholas Weiss, MRN:  [id number], DOB:  1950/07/12, LOS: 0 ADMISSION DATE:  12/06/2021, CONSULTATION DATE:  12/07/21 REFERRING MD:  Lurline Hare CHIEF COMPLAINT:  Hyperglycemia    HPI  71 y.o with significant PMH of multiple myeloma, atrial fibrillation, T1DM, AV block first-degree, CKD stage III, CVA, chronic anemia with abnormal SPEP, HTN, HLD and cocaine abuse who presented to the ED with chief complaints of hyperglycemia.  ED Course: In the emergency department, vital signs showed blood pressure of 112/62 mm Hg, HR 94 per minute, a respiratory rate of 26 breaths per minute, and a temperature of 96.37F (35.8C), oxygen saturation 100% on room air.   Pertinent Labs/Diagnostics Findings: Chemistry:Na+/ K+: 135/6.9,  glucose: 805  BUN/Cr.:63/3.28, CO2 <7 Anion Gap not calculated CBC: WBC: 7.6, Hgb/Hct: 9.5/33.9 Other Lab findings: Beta hydroxybutyrate >8.0 PCT: pending, Lactic acid: 8.3, COVID PCR: recently  +, Troponin 41, Venous Blood Gas result:  pO2 59; pCO2 <18; pH 7.02;  HCO3 4.4, %O2 Sat 76.4  Imaging:  CXR> no active disease  The laboratory data showed an anion gap, metabolic acidosis, and hyperglycemia consistent with the diagnosis of DKA. Urinalysis and chest xray demonstrated no evidence of infection. The patient's hemoglobin A1c (A1C) pending. Given ECG changes and severe AKI, the management was initiated with initial intravenous fluid, hyperkalemia correction with IV calcium gluconate, and intravenous (IV) bicarbonate push followed by intravenous insulin bolus and infusion as per DKA protocol. Serum electrolytes were closely monitored. PCCM consulted to admit for further management of DKA.  Past Medical History    Stroke (cerebrum) ([place]) 11/07/2021   Multiple myeloma ([place]) 09/29/2021   Multiple myeloma not having achieved remission ([place]) 09/29/2021   Chronic hyponatremia 08/18/2021   COVID-19 virus detected 08/13/2021   History of CVA (cerebrovascular accident)  08/13/2021   Syncope and collapse 08/09/2021   Generalized abdominal pain 08/05/2021   Hepatic lesion 08/05/2021   Dyslipidemia 05/08/2021   History of substance abuse ([place]) 98/42/1031   Acute metabolic encephalopathy 28/01/8866   Cocaine abuse ([place]) 11/30/2020   Anemia of chronic disease 11/08/2020   Muscle twitching 09/12/2020   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   Pre-syncope 08/01/2020   Heart block AV first degree 08/01/2020   Anemia of chronic kidney failure, stage 3 (moderate) ([place]) 05/28/2020   PUD (peptic ulcer disease)     Esophageal dysphagia     Encounter for screening colonoscopy     Generalized weakness 07/28/2019   Hypoglycemia 04/27/2019   Unresponsiveness 04/27/2019   Lung nodule 04/07/2019   Uncontrolled type 1 diabetes mellitus     Acute renal failure with acute tubular necrosis superimposed on stage 3a chronic kidney disease ([place])     Hypertensive urgency 04/05/2019   AKI (acute kidney injury) ([place]) 04/05/2019   CKD (chronic kidney disease) stage 3, GFR 30-59 ml/min ([place]) 04/05/2019   DKA (diabetic ketoacidoses) 04/06/2016   Hypercholesterolemia 01/24/2016   Essential (primary) hypertension 06/07/2006   Type 1 diabetes mellitus with diabetic neuropathy, unspecified ([place])    Significant Hospital Events   9/10: Admitted to ICU with DKA  Consults:  Diabetes coordinator  Procedures:  None  Significant Diagnostic Tests:  9/10: Chest Xray> no acute cardiopulmonary process  Micro Data:  8/30: SARS-CoV-2 PCR> positive 9/10: Blood culture x2> 9/10: Urine Culture> 9/10: MRSA PCR>>   Antimicrobials:  None  OBJECTIVE  Blood pressure (!) 105/51, pulse 91, temperature (!) 97.4 F (36.3 C), temperature source Oral, resp. rate 18, SpO2 100 %.  No intake or output data in the 24 hours ending 12/07/21 0148  There were no vitals filed for this visit.   Physical Examination  GENERAL:71  year-old male critically ill patient lying in the bed with  no acute distress.  EYES: Pupils equal, round, reactive to light and accommodation. No scleral icterus. Extraocular muscles intact.  HEENT: Head atraumatic, normocephalic. Oropharynx and nasopharynx clear.  NECK:  Supple, no jugular venous distention. No thyroid enlargement, no tenderness.  LUNGS: Normal breath sounds bilaterally, no wheezing, rales,rhonchi or crepitation. No use of accessory muscles of respiration.  CARDIOVASCULAR: S1, S2 normal. No murmurs, rubs, or gallops.  ABDOMEN: Soft, nontender, nondistended. Bowel sounds present. No organomegaly or mass.  EXTREMITIES: No pedal edema, cyanosis, or clubbing.  NEUROLOGIC: Cranial nerves II through XII are intact.  Muscle strength 5/5 in all extremities. Sensation intact. Gait not checked.  PSYCHIATRIC: The patient is alert and oriented x 1.  SKIN: No obvious rash, lesion, or ulcer.   Labs/imaging that I havepersonally reviewed  (right click and "Reselect all SmartList Selections" daily)     Labs   CBC: Recent Labs  Lab 12/06/21 2347  WBC 7.6  NEUTROABS 6.0  HGB 9.5*  HCT 33.9*  MCV 108.0*  PLT 537*    Basic Metabolic Panel: Recent Labs  Lab 12/06/21 2347  NA 135  K 6.9*  CL 98  CO2 <7*  GLUCOSE 805*  BUN 63*  CREATININE 3.28*  CALCIUM 8.7*  MG 2.8*  PHOS 10.4*   GFR: Estimated Creatinine Clearance: 20.6 mL/min (A) (by C-G formula based on SCr of 3.28 mg/dL (H)). Recent Labs  Lab 12/06/21 2347  WBC 7.6    Liver Function Tests: Recent Labs  Lab 12/06/21 2347  AST 32  ALT 23  ALKPHOS 78  BILITOT 0.5  PROT 8.1  ALBUMIN 3.4*   Recent Labs  Lab 12/06/21 2347  LIPASE 31   No results for input(s): "AMMONIA" in the last 168 hours.  ABG    Component Value Date/Time   PHART 7.43 11/29/2020 1221   PCO2ART 46 11/29/2020 1221   PO2ART 85 11/29/2020 1221   HCO3 4.4 (L) 12/06/2021 2347   TCO2 23 04/02/2018 0003   ACIDBASEDEF 24.9 (H) 12/06/2021 2347   O2SAT 76.4 12/06/2021 2347     Coagulation  Profile: No results for input(s): "INR", "PROTIME" in the last 168 hours.  Cardiac Enzymes: No results for input(s): "CKTOTAL", "CKMB", "CKMBINDEX", "TROPONINI" in the last 168 hours.  HbA1C: Hgb A1c MFr Bld  Date/Time Value Ref Range Status  11/27/2021 09:59 AM 8.9 (H) 4.8 - 5.6 % Final    Comment:    (NOTE) Pre diabetes:          5.7%-6.4%  Diabetes:              >6.4%  Glycemic control for   <7.0% adults with diabetes   08/09/2021 10:52 PM 10.2 (H) 4.8 - 5.6 % Final    Comment:    (NOTE) Pre diabetes:          5.7%-6.4%  Diabetes:              >6.4%  Glycemic control for   <7.0% adults with diabetes     CBG: Recent Labs  Lab 12/07/21 0102  GLUCAP >600*    Review of Systems:   UANBLE TO OBTAIN FROM PATIENT DUE TO AMS  Past Medical History  He,  has a past medical history of DKA (diabetic ketoacidoses) (04/06/2016), Hypercholesteremia, and  Hypertension.   Surgical History    Past Surgical History:  Procedure Laterality Date   COLONOSCOPY WITH PROPOFOL N/A 02/01/2020   Procedure: COLONOSCOPY WITH PROPOFOL;  Surgeon: Lin Landsman, MD;  Location[facility] ENDOSCOPY;  Service: Gastroenterology;  Laterality: N/A;   ESOPHAGOGASTRODUODENOSCOPY  02/01/2020   Procedure: ESOPHAGOGASTRODUODENOSCOPY (EGD);  Surgeon: Lin Landsman, MD;  Location: [facility] ENDOSCOPY;  Service: Gastroenterology;;   KNEE SURGERY Right    Torn meniscus   KNEE SURGERY Left      Social History   reports that he has never smoked. He has never used smokeless tobacco. He reports that he does not currently use alcohol. He reports that he does not currently use drugs after having used the following drugs: Marijuana.   Family History   His family history includes Cancer in his sister; Heart attack in his father; Hypertension in his sister.   Allergies Allergies  Allergen Reactions   Penicillins Other (See Comments)    Has patient had a PCN reaction causing immediate rash,  facial/tongue/throat swelling, SOB or lightheadedness with hypotension: No Has patient had a PCN reaction causing severe rash involving mucus membranes or skin necrosis: No Has patient had a PCN reaction that required hospitalization No Has patient had a PCN reaction occurring within the last 10 years: No If all of the above answers are "NO", then may proceed with Cephalosporin use.  Other reaction(s): Other (see comments) Other reaction(s): Other (See Comments) Has patient had a PCN reaction causing immediate rash, facial/tongue/throat swelling, SOB or lightheadedness with hypotension: No Has patient had a PCN reaction causing severe rash involving mucus membranes or skin necrosis: No Has patient had a PCN reaction that required hospitalization No Has patient had a PCN reaction occurring within the last 10 years: No If all of the above answers are "NO", then may proceed with Cephalosporin use. Has patient had a PCN reaction causing immediate rash, facial/tongue/throat swelling, SOB or lightheadedness with hypotension: No Has patient had a PCN reaction causing severe rash involving mucus membranes or skin necrosis: No Has patient had a PCN reaction that required hospitalization No Has patient had a PCN reaction occurring within the last 10 years: No If all of the above answers are "NO", then may proceed with Cephalosporin use.     Home Medications  Prior to Admission medications   Medication Sig Start Date End Date Taking? Authorizing Provider  acyclovir (ZOVIRAX) 400 MG tablet Take 1 tablet (400 mg total) by mouth 2 (two) times daily. 11/21/21   Sindy Guadeloupe, MD  Alcohol Swabs (B-D SINGLE USE SWABS REGULAR) PADS Use to check blood sugar four times daily for type 1 diabetes 07/11/20   Birdie Sons, MD  amLODipine (NORVASC) 5 MG tablet Take 1 tablet (5 mg total) by mouth daily. 11/07/21   Hughie Closs, PA-C  ASPIRIN 81 PO Take 81 mg by mouth daily.    [provider]   atorvastatin (LIPITOR) 80 MG tablet Take 1 tablet (80 mg total) by mouth at bedtime. 08/11/21   Shawna Clamp, MD  baclofen 5 MG TABS Take 5 mg by mouth 3 (three) times daily as needed for muscle spasms. 11/07/21   Ventura Sellers, MD  Blood Glucose Calibration (TRUE METRIX LEVEL 1) Low SOLN Use to check blood sugar four times daily for type 1 diabetes 07/11/20   Birdie Sons, MD  clopidogrel (PLAVIX) 75 MG tablet Take 1 tablet (75 mg total) by mouth daily. Patient not taking: Reported on  11/27/2021 08/12/21   Shawna Clamp, MD  FARXIGA 10 MG TABS tablet Take 10 mg by mouth daily. 05/05/21   [provider]  gabapentin (NEURONTIN) 300 MG capsule Take 300 mg by mouth in the morning and 600 mg at night. 04/04/21   [provider]  insulin degludec (TRESIBA FLEXTOUCH) 100 UNIT/ML FlexTouch Pen 15 units daily. Patient taking differently: Inject 20 Units into the skin daily. 20 units daily. 12/04/20   Aline August, MD  Insulin Syringe-Needle U-100 (INSULIN SYRINGE 1CC/31GX5/16") 31G X 5/16" 1 ML MISC For insulin injections up to 4 times daily 07/11/20   Birdie Sons, MD  lenalidomide (REVLIMID) 10 MG capsule TAKE 1 CAPSULE BY MOUTH EVERY DAY FOR 14 DAYS THEN HOLD FOR 7 DAYS. REPEAT EVERY 21 DAYS. 11/26/21   Sindy Guadeloupe, MD  lisinopril (ZESTRIL) 2.5 MG tablet Take 2.5 mg by mouth daily. 11/08/20 11/08/21  [provider]  NOVOLOG 100 UNIT/ML injection INJECT UP TO 14 UNITS THREE TIMES DAILY BEFORE MEALS ACCORDING TO SLIDING SCALE 09/09/21   Birdie Sons, MD  ondansetron (ZOFRAN) 4 MG tablet Take 1 tablet (4 mg total) by mouth every 8 (eight) hours as needed for nausea or vomiting. Patient not taking: Reported on 10/17/2021 05/07/21   Merlyn Lot, MD  prochlorperazine (COMPAZINE) 10 MG tablet Take 1 tablet (10 mg total) by mouth every 6 (six) hours as needed (Nausea or vomiting). Patient not taking: Reported on 10/17/2021 09/29/21 11/21/21  Sindy Guadeloupe, MD    Scheduled  Meds:  heparin  5,000 Units Subcutaneous Q8H   insulin aspart  10 Units Intravenous Once   patiromer  8.4 g Oral Daily   Continuous Infusions:  calcium gluconate 1,000 mg (12/07/21 0142)   dextrose 5% lactated ringers     insulin 12 Units/hr (12/07/21 0140)   lactated ringers     lactated ringers     PRN Meds:.dextrose, docusate sodium, polyethylene glycol  Active Hospital Problem list     Assessment & Plan:  Diabetic Ketoacidosis likely in the setting of noncompliance, ?underlying infectious process and Cocaine Intoxication PMHx: DM type 1  UDS +Cocaine + Marijuana -CXR, UA negative, Blood  and urine Cultures for infection pending -Troponin  slightly elevated, EKG peaked T waves with no evidence of ischemia -Received Insulin (regular) 0.1u/kg (~10 units) IV x1. Continue Insulin drip, per DKA protocol -Keep NPO -Glucose q1h to titrate insulin -4h BMP+mag, (ABG/VBG)  -Aggressive  volume repletion  -Goal to normalize anion gap  -Diabetes coordinator consult  Suspected Sepsis of unknown source Meets SIRS Criteria  Recent COVID-19 infection (asymptomatic) -Monitor fever curve -Trend WBC's & Procalcitonin -Follow cultures as above -Hold empiric antibiotics for now  pending cultures & sensitivities   AKI on CKD stage III Hyperkalemia corrected separately Severe Anion Gap Metabolic Acidosis with Lactic Acidosis -Monitor I&O's / urinary output -Follow BMP -Ensure adequate renal perfusion -Avoid nephrotoxic agents as able -Replace electrolytes as indicated  Hx Recent CVA HTN HLD -Continue Aspirin and Plavix -Continue atorvastatin 80 mg -Hold lisinopril in setting of AKI -Continue amlodipine  Polysubstance Abuse UDS+ Cocaine & Marijuana -High risk for withdrawal -Counseling provided    Best practice:  Diet:  NPO Pain/Anxiety/Delirium protocol (if indicated): No VAP protocol (if indicated): Not indicated DVT prophylaxis: Subcutaneous Heparin GI prophylaxis:  PPI Glucose control:  Insulin gtt Central venous access:  N/A Arterial line:  N/A Foley:  N/A Mobility:  bed rest  PT consulted: N/A Last date of multidisciplinary goals of care  discussion [8/31] Code Status:  full code Disposition: Stepdown   = Goals of Care = Code Status Order: FULL  Primary Emergency Contact: Bruce,[name], Home Phone: [phone] Wishes to pursue full aggressive treatment and intervention options, including CPR and intubation, but goals of care will be addressed on going with family if that should become necessary.  Critical care time: 45 minutes       Rufina Falco, DNP, CCRN, FNP-C, AGACNP-BC Acute Care Nurse Practitioner [facility]  PCCM on call pager [phone] until 7 am

## 2021-12-07 NOTE — Progress Notes (Signed)
Patient's glucose reading is 97, per endo tool drop patient's insulin drip to 0.9 units/hour and patient to start transition off insulin drip as orders were placed.  Reached out to Dr. Mortimer Fries regarding the current glucose reading and concern continuing insulin drip for two more hours and patient's glucose dropping lower.  Per Dr. Mortimer Fries move forward with turning off the insulin drip now.  Will continue to monitor.

## 2021-12-07 NOTE — Progress Notes (Signed)
Notified Rufina Falco NP that patient lactic acid is 8.3 and troponin is 81. Awaiting further orders.

## 2021-12-07 NOTE — Progress Notes (Signed)
Pt glucose remains above 600 despite being no insulin drip at 12 units per hour. Per endotool pt is not responding. Notified Rufina Falco NP. Awaiting further orders.

## 2021-12-07 NOTE — Progress Notes (Signed)
[facility] Progress Note Patient Name: [name] DOB: Feb 05, 1951 MRN: [id number]   Date of Service  12/07/2021  HPI/Events of Note  70/M with history of multiple myeloma, afib, who presents with hyperglycemia. Workup in the ED consistent with DKA, with an anion gap of 25.  He also had a significant lactate of 6.7, and was positive for COVID. He was started on fluids, insulin, and admitted to the ICU.   eICU Interventions  DKA - Started on an insulin drip. Will transition to [place] insulin when anion gap has closed.  - Continue IVF. Will add dextrose containing fluid when glucose drops below 263m/dL - Monitor K, replete as warranted.  - Serial BMP.  - Unclear what had triggered DKA - compliance, vs. COVID, vs. Substance abuse  COVID - maintain on appropriate isolation - Presently afebrile, maintianing SpO2 >90% on room air.  - Will continue to monitor closely.   Substance abuse - Tested positive for cocaine.  - Will monitor for signs and symptoms of withdrawal.           Lynise Porr M DELA CRUZ 12/07/2021, 2:25 AM

## 2021-12-07 NOTE — Consult Note (Signed)
PHARMACY CONSULT NOTE - FOLLOW UP  Pharmacy Consult for Electrolyte Monitoring and Replacement   Recent Labs: Potassium (mmol/L)  Date Value  12/07/2021 3.9  10/11/2012 4.2   Magnesium (mg/dL)  Date Value  12/06/2021 2.8 (H)   Calcium (mg/dL)  Date Value  12/07/2021 8.4 (L)   Calcium, Total (mg/dL)  Date Value  10/11/2012 8.7   Albumin (g/dL)  Date Value  12/06/2021 3.4 (L)  10/03/2020 3.3 (L)   Phosphorus (mg/dL)  Date Value  12/06/2021 10.4 (H)   Sodium (mmol/L)  Date Value  12/07/2021 141  10/03/2020 135  10/11/2012 137     Assessment: Pharmacy has been consulted to monitor and replace electrolytes in 71yo male with history of T1DM, CKD3, HTN, HLD and multiple myeloma being admitted with DKA. UDS also positive for cocaine and marijuana.  IVF: D5+LR@125ml /h  Goal of Therapy:  Electrolytes WNL  Plan:  - CCMD aware of hyperphosphatemia. Potential to have nephrology consulted, will defer pharmacological treatment currently - No electrolyte replacement needed - Will f/u with AM labs  Pearla Dubonnet ,PharmD Clinical Pharmacist 12/07/2021 8:10 AM

## 2021-12-08 DIAGNOSIS — E1010 Type 1 diabetes mellitus with ketoacidosis without coma: Secondary | ICD-10-CM

## 2021-12-08 LAB — SARS CORONAVIRUS 2 BY RT PCR: SARS Coronavirus 2 by RT PCR: NEGATIVE

## 2021-12-08 LAB — CBC WITH DIFFERENTIAL/PLATELET
Abs Immature Granulocytes: 0.01 10*3/uL (ref 0.00–0.07)
Basophils Absolute: 0 10*3/uL (ref 0.0–0.1)
Basophils Relative: 0 %
Eosinophils Absolute: 0 10*3/uL (ref 0.0–0.5)
Eosinophils Relative: 0 %
HCT: 27.3 % — ABNORMAL LOW (ref 39.0–52.0)
Hemoglobin: 8.9 g/dL — ABNORMAL LOW (ref 13.0–17.0)
Immature Granulocytes: 0 %
Lymphocytes Relative: 12 %
Lymphs Abs: 0.7 10*3/uL (ref 0.7–4.0)
MCH: 30.3 pg (ref 26.0–34.0)
MCHC: 32.6 g/dL (ref 30.0–36.0)
MCV: 92.9 fL (ref 80.0–100.0)
Monocytes Absolute: 1.1 10*3/uL — ABNORMAL HIGH (ref 0.1–1.0)
Monocytes Relative: 17 %
Neutro Abs: 4.5 10*3/uL (ref 1.7–7.7)
Neutrophils Relative %: 71 %
Platelets: 377 10*3/uL (ref 150–400)
RBC: 2.94 MIL/uL — ABNORMAL LOW (ref 4.22–5.81)
RDW: 15 % (ref 11.5–15.5)
Smear Review: NORMAL
WBC: 6.3 10*3/uL (ref 4.0–10.5)
nRBC: 0 % (ref 0.0–0.2)

## 2021-12-08 LAB — BASIC METABOLIC PANEL
Anion gap: 4 — ABNORMAL LOW (ref 5–15)
BUN: 46 mg/dL — ABNORMAL HIGH (ref 8–23)
CO2: 27 mmol/L (ref 22–32)
Calcium: 8.3 mg/dL — ABNORMAL LOW (ref 8.9–10.3)
Chloride: 112 mmol/L — ABNORMAL HIGH (ref 98–111)
Creatinine, Ser: 2.04 mg/dL — ABNORMAL HIGH (ref 0.61–1.24)
GFR, Estimated: 35 mL/min — ABNORMAL LOW (ref >60)
Glucose, Bld: 134 mg/dL — ABNORMAL HIGH (ref 70–99)
Potassium: 4.2 mmol/L (ref 3.5–5.1)
Sodium: 143 mmol/L (ref 135–145)

## 2021-12-08 LAB — GLUCOSE, CAPILLARY
Glucose-Capillary: >600 mg/dL (ref 70–99)
Glucose-Capillary: 121 mg/dL — ABNORMAL HIGH (ref 70–99)
Glucose-Capillary: 124 mg/dL — ABNORMAL HIGH (ref 70–99)
Glucose-Capillary: 162 mg/dL — ABNORMAL HIGH (ref 70–99)
Glucose-Capillary: 238 mg/dL — ABNORMAL HIGH (ref 70–99)
Glucose-Capillary: 219 mg/dL — ABNORMAL HIGH (ref 70–99)

## 2021-12-08 LAB — URINE CULTURE: Culture: NO GROWTH

## 2021-12-08 LAB — MAGNESIUM: Magnesium: 2 mg/dL (ref 1.7–2.4)

## 2021-12-08 LAB — PHOSPHORUS: Phosphorus: 3.4 mg/dL (ref 2.5–4.6)

## 2021-12-08 MED ORDER — GABAPENTIN 300 MG PO CAPS
300.0000 mg | ORAL_CAPSULE | Freq: Every morning | ORAL | Status: DC
  Administered 2021-12-08 – 2021-12-09 (×2): 300 mg via ORAL
  Filled 2021-12-08 (×2): qty 1

## 2021-12-08 MED ORDER — ONDANSETRON HCL 4 MG/2ML IJ SOLN
4.0000 mg | Freq: Four times a day (QID) | INTRAMUSCULAR | Status: DC | PRN
  Administered 2021-12-08: 4 mg via INTRAVENOUS
  Filled 2021-12-08: qty 2

## 2021-12-08 MED ORDER — NEPRO/CARBSTEADY PO LIQD
237.0000 mL | Freq: Three times a day (TID) | ORAL | Status: DC
  Administered 2021-12-08 – 2021-12-09 (×3): 237 mL via ORAL

## 2021-12-08 MED ORDER — ASPIRIN 81 MG PO TBEC
81.0000 mg | DELAYED_RELEASE_TABLET | Freq: Every day | ORAL | Status: DC
  Administered 2021-12-08 – 2021-12-09 (×2): 81 mg via ORAL
  Filled 2021-12-08 (×2): qty 1

## 2021-12-08 MED ORDER — ADULT MULTIVITAMIN W/MINERALS CH
1.0000 | ORAL_TABLET | Freq: Every day | ORAL | Status: DC
  Administered 2021-12-09: 1 via ORAL
  Filled 2021-12-08: qty 1

## 2021-12-08 MED ORDER — ATORVASTATIN CALCIUM 20 MG PO TABS
80.0000 mg | ORAL_TABLET | Freq: Every day | ORAL | Status: DC
  Administered 2021-12-08: 80 mg via ORAL
  Filled 2021-12-08: qty 4

## 2021-12-08 MED ORDER — ACETAMINOPHEN 325 MG PO TABS: 650.0000 mg | ORAL_TABLET | Freq: Four times a day (QID) | ORAL | Status: DC | PRN

## 2021-12-08 MED ORDER — BACLOFEN 10 MG PO TABS: 5.0000 mg | ORAL_TABLET | Freq: Three times a day (TID) | ORAL | Status: DC | PRN

## 2021-12-08 MED ORDER — LACTATED RINGERS IV SOLN: INTRAVENOUS | Status: DC

## 2021-12-08 MED ORDER — INSULIN ASPART 100 UNIT/ML IJ SOLN
0.0000 [IU] | Freq: Every day | INTRAMUSCULAR | Status: DC
  Administered 2021-12-08: 2 [IU] via SUBCUTANEOUS
  Filled 2021-12-08: qty 1

## 2021-12-08 MED ORDER — GABAPENTIN 300 MG PO CAPS
600.0000 mg | ORAL_CAPSULE | Freq: Every day | ORAL | Status: DC
  Administered 2021-12-08: 600 mg via ORAL
  Filled 2021-12-08: qty 2

## 2021-12-08 MED ORDER — INSULIN ASPART 100 UNIT/ML IJ SOLN: 0.0000 [IU] | Freq: Three times a day (TID) | INTRAMUSCULAR | Status: DC

## 2021-12-08 MED ORDER — DICYCLOMINE HCL 20 MG PO TABS
20.0000 mg | ORAL_TABLET | Freq: Three times a day (TID) | ORAL | Status: DC
  Administered 2021-12-08 – 2021-12-09 (×4): 20 mg via ORAL
  Filled 2021-12-08 (×8): qty 1

## 2021-12-08 MED ORDER — ACYCLOVIR 200 MG PO CAPS
400.0000 mg | ORAL_CAPSULE | Freq: Two times a day (BID) | ORAL | Status: DC
  Administered 2021-12-08 – 2021-12-09 (×3): 400 mg via ORAL
  Filled 2021-12-08 (×4): qty 2

## 2021-12-08 MED ORDER — LISINOPRIL 10 MG PO TABS
10.0000 mg | ORAL_TABLET | Freq: Every day | ORAL | Status: DC
  Administered 2021-12-08 – 2021-12-09 (×2): 10 mg via ORAL
  Filled 2021-12-08: qty 2
  Filled 2021-12-08: qty 1

## 2021-12-08 MED ORDER — OXYCODONE HCL 5 MG PO TABS: 5.0000 mg | ORAL_TABLET | ORAL | Status: DC | PRN

## 2021-12-08 NOTE — Consult Note (Signed)
PHARMACY CONSULT NOTE - FOLLOW UP  Pharmacy Consult for Electrolyte Monitoring and Replacement   Recent Labs: Potassium (mmol/L)  Date Value  12/08/2021 4.2  10/11/2012 4.2   Magnesium (mg/dL)  Date Value  12/08/2021 2.0   Calcium (mg/dL)  Date Value  12/08/2021 8.3 (L)   Calcium, Total (mg/dL)  Date Value  10/11/2012 8.7   Albumin (g/dL)  Date Value  12/06/2021 3.4 (L)  10/03/2020 3.3 (L)   Phosphorus (mg/dL)  Date Value  12/08/2021 3.4   Sodium (mmol/L)  Date Value  12/08/2021 143  10/03/2020 135  10/11/2012 137     Assessment: Pharmacy has been consulted to monitor and replace electrolytes in 71yo male with history of T1DM, CKD3, HTN, HLD and multiple myeloma being admitted with DKA. UDS also positive for cocaine and marijuana.  IVF: lactated ringers 75 ml/h  Goal of Therapy:  Electrolytes WNL  Plan:  No electrolyte replacement needed Because this consult was generated as part of an ICU order set and patient is transferring pharmacy will sign off for now Please feel free to reach out if any further assistance is needed  Dallie Piles ,PharmD Clinical Pharmacist 12/08/2021 7:09 AM

## 2021-12-08 NOTE — TOC Initial Note (Signed)
Transition of Care [facility]) - Initial/Assessment Note    Patient Details  Name: Nicholas Weiss MRN: [id number] Date of Birth: [birth year]  Transition of Care [facility]) CM/SW Contact:    Shelbie Hutching, RN Phone Number: 12/08/2021, 10:35 AM  Clinical Narrative:                 Patient admitted to the hospital for DKA, recently admitted and discharged for the same.  RNCM met with patient at the bedside, patient is not feeling well, his stomach hurts.   Patient lives with his significant other Chrystie Nose, he is independent, he drives.  He reports that he checks his blood sugars and takes his medications like prescribed.    TOC will follow.   Expected Discharge Plan: [place] Barriers to Discharge: Continued Medical Work up   Patient Goals and CMS Choice Patient states their goals for this hospitalization and ongoing recovery are:: patient's stomach doesn't feel well- wants to feel better      Expected Discharge Plan and Services Expected Discharge Plan: [place]   Discharge Planning Services: CM Consult   Living arrangements for the past 2 months: Single Family Home                 DME Arranged: N/A DME Agency: NA                  Prior Living Arrangements/Services Living arrangements for the past 2 months: Single Family Home Lives with:: Significant Other Patient language and need for interpreter reviewed:: Yes Do you feel safe going back to the place where you live?: Yes      Need for Family Participation in Patient Care: Yes (Comment) Care giver support system in place?: Yes (comment)   Criminal Activity/Legal Involvement Pertinent to Current Situation/Hospitalization: No - Comment as needed  Activities of Daily Living Home Assistive Devices/Equipment: None ADL Screening (condition at time of admission) Patient's cognitive ability adequate to safely complete daily activities?: Yes Is the patient deaf or have difficulty  hearing?: No Does the patient have difficulty seeing, even when wearing glasses/contacts?: No Does the patient have difficulty concentrating, remembering, or making decisions?: No Patient able to express need for assistance with ADLs?: Yes Does the patient have difficulty dressing or bathing?: No Independently performs ADLs?: Yes (appropriate for developmental age) Does the patient have difficulty walking or climbing stairs?: Yes Weakness of Legs: Both Weakness of Arms/Hands: Both  Permission Sought/Granted Permission sought to share information with : Case Manager, Family Supports Permission granted to share information with : Yes, Verbal Permission Granted  Share Information with NAME: Chrystie Nose     Permission granted to share info w Relationship: significant other  Permission granted to share info w Contact Information: [phone]  Emotional Assessment Appearance:: Appears stated age Attitude/Demeanor/Rapport: Engaged Affect (typically observed): Accepting Orientation: : Oriented to Self, Oriented to Place, Oriented to  Time, Oriented to Situation   Psych Involvement: No (comment)  Admission diagnosis:  Hyperkalemia [E87.5] DKA (diabetic ketoacidosis) ([place]) [E11.10] Diabetic ketoacidosis without coma associated with type 1 diabetes mellitus ([place]) [E10.10] Nausea and vomiting, unspecified vomiting type [R11.2] Patient Active Problem List   Diagnosis Date Noted   DKA (diabetic ketoacidosis) ([place]) 11/27/2021   Stroke (cerebrum) ([place]) 11/07/2021   Multiple myeloma ([place]) 09/29/2021   Multiple myeloma not having achieved remission ([place]) 09/29/2021   Chronic hyponatremia 08/18/2021   COVID-19 virus detected 08/13/2021   History of CVA (cerebrovascular accident) 08/13/2021  Syncope and collapse 08/09/2021   Generalized abdominal pain 08/05/2021   Hepatic lesion 08/05/2021   Dyslipidemia 05/08/2021   History of substance abuse ([place]) 69/62/9528   Acute metabolic  encephalopathy 41/32/4401   Cocaine abuse ([place]) 11/30/2020   Anemia of chronic disease 11/08/2020   Muscle twitching 09/12/2020   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   Pre-syncope 08/01/2020   Heart block AV first degree 08/01/2020   Anemia of chronic kidney failure, stage 3 (moderate) ([place]) 05/28/2020   PUD (peptic ulcer disease)    Esophageal dysphagia    Encounter for screening colonoscopy    Generalized weakness 07/28/2019   Hypoglycemia 04/27/2019   Unresponsiveness 04/27/2019   Lung nodule 04/07/2019   Uncontrolled type 1 diabetes mellitus    Acute renal failure with acute tubular necrosis superimposed on stage 3a chronic kidney disease ([place])    Hypertensive urgency 04/05/2019   AKI (acute kidney injury) ([place]) 04/05/2019   CKD (chronic kidney disease) stage 3, GFR 30-59 ml/min ([place]) 04/05/2019   DKA (diabetic ketoacidoses) 04/06/2016   Hypercholesterolemia 01/24/2016   Essential (primary) hypertension 06/07/2006   Type 1 diabetes mellitus with diabetic neuropathy, unspecified ([place]) 05/31/2006   PCP:  Dion Body, MD Pharmacy:   [facility] [address] ([address]), [place]) [place] [address] Phone: [phone] Fax: [place] [facility] [place], [place] [address] Phone: [phone] Fax: [phone]  [place], [place] [address] Phone: [phone] Fax: [phone]     Social Determinants of Health (SDOH) Interventions    Readmission Risk Interventions    12/08/2021   10:33 AM 11/27/2021    2:55 PM 11/30/2020    9:44 AM  Readmission Risk Prevention Plan  Transportation Screening Complete Complete Complete  Medication Review (RN Care Manager) Complete Complete Complete  PCP or Specialist appointment within 3-5 days of discharge  Complete Complete Complete  HRI or Home Care Consult Complete Complete Complete  SW Recovery Care/Counseling Consult Complete Complete Complete  Palliative Care Screening Not Applicable Not Applicable Not Applicable  Skilled Nursing Facility Not Applicable Not Applicable Complete

## 2021-12-08 NOTE — Plan of Care (Signed)
  Problem: Education: Goal: Knowledge of General Education information will improve Description: Including pain rating scale, medication(s)/side effects and non-pharmacologic comfort measures Outcome: Progressing   Problem: Clinical Measurements: Goal: Ability to maintain clinical measurements within normal limits will improve Outcome: Progressing Goal: Will remain free from infection Outcome: Progressing Goal: Diagnostic test results will improve Outcome: Progressing Goal: Respiratory complications will improve Outcome: Progressing Goal: Cardiovascular complication will be avoided Outcome: Progressing   Problem: Activity: Goal: Risk for activity intolerance will decrease Outcome: Progressing   Problem: Nutrition: Goal: Adequate nutrition will be maintained Outcome: Progressing   Problem: Elimination: Goal: Will not experience complications related to bowel motility Outcome: Progressing Goal: Will not experience complications related to urinary retention Outcome: Progressing   Problem: Safety: Goal: Ability to remain free from injury will improve Outcome: Progressing   Problem: Metabolic: Goal: Ability to maintain appropriate glucose levels will improve Outcome: Progressing   Problem: Nutritional: Goal: Maintenance of adequate nutrition will improve Outcome: Progressing   Problem: Skin Integrity: Goal: Risk for impaired skin integrity will decrease Outcome: Progressing   Problem: Tissue Perfusion: Goal: Adequacy of tissue perfusion will improve Outcome: Progressing

## 2021-12-08 NOTE — Inpatient Diabetes Management (Signed)
Inpatient Diabetes Program Recommendations  AACE/ADA: New Consensus Statement on Inpatient Glycemic Control  Target Ranges:  Prepandial:   less than 140 mg/dL      Peak postprandial:   less than 180 mg/dL (1-2 hours)      Critically ill patients:  140 - 180 mg/dL    Latest Reference Range & Units 12/08/21 04:11 12/08/21 08:17  Glucose-Capillary 70 - 99 mg/dL 121 (H) 124 (H)    Latest Reference Range & Units 12/07/21 12:51 12/07/21 14:01 12/07/21 15:01 12/07/21 16:05 12/07/21 17:08 12/07/21 19:56 12/07/21 23:49  Glucose-Capillary 70 - 99 mg/dL 185 (H) 142 (H) 108 (H) 97 132 (H) 151 (H) 150 (H)    Latest Reference Range & Units 12/06/21 23:47  CO2 22 - 32 mmol/L <7 (L)  Glucose 70 - 99 mg/dL 805 (HH)  Anion gap 5 - 15  NOT CALCULATED   Review of Glycemic Control  Diabetes history: DM1; admitted with DKA; UDS +cocaine, + marijuana   Outpatient Diabetes medications: Tresiba 20 units daily, Novolog up to 14 units TID with meals per sliding scale, Farxiga 10 mg daily Current orders for Inpatient glycemic control: Semglee 15 units QHS, Novolog 0-9 units Q4H  Inpatient Diabetes Program Recommendations:    Insulin: If patient is eating well, please consider changing frequency of CBGs and Novolog 0-9 units to AC&HS.   NOTE: Patient admitted on 12/07/21 with DKA likely in setting of noncompliance (cocaine and marijuana positive), suspected sepsis, AKI on CKD, and polysubstance abuse. Patient well known to inpatient diabetes team due to frequent hospital admission; recently inpatient 11/27/21-11/28/21 with DKA and inpatient diabetes coordinator spoke with patient on 11/27/21 regarding DM control. Will follow along while inpatient.   Thanks, Barnie Alderman, RN, MSN, [place] Diabetes Coordinator Inpatient Diabetes Program [phone] (Team Pager from 8am to [place])

## 2021-12-08 NOTE — Progress Notes (Addendum)
PROGRESS NOTE    Nicholas Weiss  VCB:[id number] DOB: 1950-07-17 DOA: 12/06/2021 PCP: Dion Body, MD    Brief Narrative:  71 y.o with significant PMH of multiple myeloma, atrial fibrillation, T1DM, AV block first-degree, CKD stage III, CVA, chronic anemia with abnormal SPEP, HTN, HLD and cocaine abuse who presented to the ED with chief complaints of hyperglycemia.  Well-known to the hospitalist service.  Frequent readmissions for similar complaints.  Admitted to intensive care unit given severe acidosis and deteriorated mental status.  Was on insulin gtt.  Anion gap closed and DKA resolved.  Mental status improved.  Patient transition to [facility] hospitalist service, [place] assumed care on 9/11.  On my evaluation patient is resting in bed.  He is mentating clearly.  Vital signs are stable.  He endorses abdominal pain and discomfort.   Assessment & Plan:   Principal Problem:   DKA (diabetic ketoacidosis) ([place]) Active Problems:   Cocaine abuse ([place])   Acute renal failure with acute tubular necrosis superimposed on stage 3a chronic kidney disease (HCC)   Type 1 diabetes mellitus with diabetic neuropathy, unspecified ([place])   Essential (primary) hypertension   Hypercholesterolemia   Smoldering multiple myeloma  Diabetic ketoacidosis Type 1 diabetes mellitus, poorly controlled Medication noncompliance I suspect that patient presented in DKA due to medication nonadherence in the setting of substance use and recent COVID infection.  He is well-known to the hospitalist service.  Anion gap closed.  DKA resolved. Initial concern for sepsis felt unlikely, sepsis ruled out Plan: Transfer to [place] to carb modified Basal bolus regimen Diabetes coordinator consult Every 4 CBGs for now Goal blood sugar 140-180  AKI on CKD stage IIIa Hyperkalemia Anion gap metabolic acidosis In the setting of DKA/poorly controlled type 1 diabetes Renal indices improved Daily renal  function  Recent COVID-19 infection Tested positive on 8/31.  States he has a home test which was negative Plan: Repeat COVID-19 testing here in 1 precautions until test result known  Hypertension Hyperlipidemia Lisinopril on hold in setting of AKI Amlodipine resumed Statin resumed  History of CVA PTA aspirin and Plavix  Polysubstance use UDS positive for cocaine and marijuana Educated on cessation  Smoldering multiple myeloma According to home medication reconciliation patient's Revlimid is currently on hold.  Defer to outpatient  DVT prophylaxis: SQ heparin Code Status: Full Family Communication: Friend at bedside 9/11 Disposition Plan: Status is: Inpatient Remains inpatient appropriate because: DKA, resolved.  Poorly controlled type 1 diabetes mellitus.  If symptoms improve anticipate discharge 9/12.   Level of care: Med-Surg  Consultants:  None  Procedures:  None  Antimicrobials: None   Subjective: And examined.  Distress due to pain.  Endorses crampy abdominal pain.  No other complaints  Objective: Vitals:   12/08/21 1000 12/08/21 1100 12/08/21 1200 12/08/21 1300  BP: (!) 166/77 (!) 160/71 (!) 159/77 (!) 140/69  Pulse:      Resp: (!) 0 10 (!) 7 19  Temp:  99.1 F (37.3 C)    TempSrc:  Oral    SpO2:      Weight:      Height:        Intake/Output Summary (Last 24 hours) at 12/08/2021 1438 Last data filed at 12/08/2021 1232 Gross per 24 hour  Intake 1077.05 ml  Output 865 ml  Net 212.05 ml   Filed Weights   12/07/21 0216 12/08/21 0500  Weight: 65.7 kg 71.7 kg    Examination:  General exam: Mild distress due to pain  Respiratory system: Lungs clear.  Normal work of breathing.  Room air Cardiovascular system: S1 S2, RRR, no murmurs, no pedal edema Gastrointestinal system: Soft, nondistended, mild diffuse tender to palpation, normal bowel sounds Central nervous system: Alert and oriented. No focal neurological deficits. Extremities: Symmetric  5 x 5 power. Skin: No rashes, lesions or ulcers Psychiatry: Judgement and insight appear normal. Mood & affect appropriate.     Data Reviewed: I have personally reviewed following labs and imaging studies  CBC: Recent Labs  Lab 12/06/21 2347 12/07/21 0322 12/08/21 0428  WBC 7.6 6.6 6.3  NEUTROABS 6.0  --  4.5  HGB 9.5* 8.4* 8.9*  HCT 33.9* 28.5* 27.3*  MCV 108.0* 105.6* 92.9  PLT 537* 406* 381   Basic Metabolic Panel: Recent Labs  Lab 12/06/21 2347 12/07/21 0322 12/07/21 0640 12/07/21 1337 12/07/21 1835 12/08/21 0428  NA 135 137 141 142 144 143  K 6.9* 4.8 3.9 3.6 4.5 4.2  CL 98 103 107 112* 111 112*  CO2 <7* 9* 17* 26 24 27   GLUCOSE 805* 763* 535* 169* 153* 134*  BUN 63* 62* 58* 51* 50* 46*  CREATININE 3.28* 3.10* 2.81* 2.24* 2.26* 2.04*  CALCIUM 8.7* 8.4* 8.4* 8.7* 8.7* 8.3*  MG 2.8*  --   --   --   --  2.0  PHOS 10.4*  --   --   --   --  3.4   GFR: Estimated Creatinine Clearance: 35.1 mL/min (A) (by C-G formula based on SCr of 2.04 mg/dL (H)). Liver Function Tests: Recent Labs  Lab 12/06/21 2347  AST 32  ALT 23  ALKPHOS 78  BILITOT 0.5  PROT 8.1  ALBUMIN 3.4*   Recent Labs  Lab 12/06/21 2347  LIPASE 31   No results for input(s): "AMMONIA" in the last 168 hours. Coagulation Profile: No results for input(s): "INR", "PROTIME" in the last 168 hours. Cardiac Enzymes: No results for input(s): "CKTOTAL", "CKMB", "CKMBINDEX", "TROPONINI" in the last 168 hours. BNP (last 3 results) No results for input(s): "PROBNP" in the last 8760 hours. HbA1C: No results for input(s): "HGBA1C" in the last 72 hours. CBG: Recent Labs  Lab 12/07/21 1956 12/07/21 2349 12/08/21 0411 12/08/21 0817 12/08/21 1117  GLUCAP 151* 150* 121* 124* 162*   Lipid Profile: No results for input(s): "CHOL", "HDL", "LDLCALC", "TRIG", "CHOLHDL", "LDLDIRECT" in the last 72 hours. Thyroid Function Tests: Recent Labs    12/06/21 2347  TSH 3.743  FREET4 0.97   Anemia Panel: No  results for input(s): "VITAMINB12", "FOLATE", "FERRITIN", "TIBC", "IRON", "RETICCTPCT" in the last 72 hours. Sepsis Labs: Recent Labs  Lab 12/07/21 0322 12/07/21 0639 12/07/21 1130 12/07/21 1337  LATICACIDVEN 8.3* 6.0* 2.8* 2.1*    Recent Results (from the past 240 hour(s))  MRSA Next Gen by PCR, Nasal     Status: None   Collection Time: 12/07/21  2:26 AM   Specimen: Nasal Mucosa; Nasal Swab  Result Value Ref Range Status   MRSA by PCR Next Gen NOT DETECTED NOT DETECTED Final    Comment: (NOTE) The GeneXpert MRSA Assay (FDA approved for NASAL specimens only), is one component of a comprehensive MRSA colonization surveillance program. It is not intended to diagnose MRSA infection nor to guide or monitor treatment for MRSA infections. Test performance is not FDA approved in patients less than 59 years old. Performed at [facility] [address], [place], [place] [address]   Urine Culture     Status: None   Collection Time: 12/07/21  2:44 AM   Specimen: Urine, Catheterized  Result Value Ref Range Status   Specimen Description   Final    URINE, CATHETERIZED Performed at [facility], [address], [place], [place] [address]    Special Requests   Final    NONE Performed at [facility], [address], [place], [place] [address]    Culture   Final    NO GROWTH Performed at [facility], [place] [address] [place], [place] [address]    Report Status 12/08/2021 FINAL  Final  Culture, blood (Routine X 2) w Reflex to ID Panel     Status: None (Preliminary result)   Collection Time: 12/07/21  3:05 AM   Specimen: BLOOD  Result Value Ref Range Status   Specimen Description BLOOD LEFT HAND  Final   Special Requests   Final    BOTTLES DRAWN AEROBIC AND ANAEROBIC Blood Culture adequate volume   Culture   Final    NO GROWTH 1 DAY Performed at [facility], [address] [place], [place] [address]    Report Status PENDING   Incomplete  Culture, blood (Routine X 2) w Reflex to ID Panel     Status: None (Preliminary result)   Collection Time: 12/07/21  3:20 AM   Specimen: BLOOD  Result Value Ref Range Status   Specimen Description BLOOD RIGHT HAND  Final   Special Requests   Final    BOTTLES DRAWN AEROBIC AND ANAEROBIC Blood Culture adequate volume   Culture   Final    NO GROWTH 1 DAY Performed at [facility], [address] [place], [place] [address]    Report Status PENDING  Incomplete         Radiology Studies: DG Chest Port 1 View  Result Date: 12/06/2021 CLINICAL DATA:  Hyperglycemia and vomiting EXAM: PORTABLE CHEST 1 VIEW COMPARISON:  Radiographs 11/26/2021 FINDINGS: No focal consolidation, pleural effusion, or pneumothorax. Normal cardiomediastinal silhouette. No acute osseous abnormality. Aortic calcification. IMPRESSION: No active disease. Aortic Atherosclerosis (ICD10-I70.0). Electronically Signed   By: Placido Sou M.D.   On: 12/06/2021 23:51        Scheduled Meds:  acyclovir  400 mg Oral BID   amLODipine  10 mg Oral Daily   aspirin EC  81 mg Oral Daily   atorvastatin  80 mg Oral QHS   Chlorhexidine Gluconate Cloth  6 each Topical Daily   dicyclomine  20 mg Oral TID AC & HS   feeding supplement (NEPRO CARB STEADY)  237 mL Oral TID BM   gabapentin  300 mg Oral q morning   gabapentin  600 mg Oral QHS   heparin  5,000 Units Subcutaneous Q8H   insulin aspart  0-9 Units Subcutaneous Q4H   insulin glargine-yfgn  15 Units Subcutaneous QHS   lisinopril  10 mg Oral Daily   [START ON 12/09/2021] multivitamin with minerals  1 tablet Oral Daily   Continuous Infusions:  lactated ringers 75 mL/hr at 12/08/21 0947     LOS: 1 day    Sidney Ace, MD [facility]   If 7PM-7AM, please contact night-coverage  12/08/2021, 2:38 PM

## 2021-12-09 DIAGNOSIS — E1010 Type 1 diabetes mellitus with ketoacidosis without coma: Secondary | ICD-10-CM | POA: Diagnosis not present

## 2021-12-09 LAB — GLUCOSE, CAPILLARY: Glucose-Capillary: 94 mg/dL (ref 70–99)

## 2021-12-09 LAB — BASIC METABOLIC PANEL
Anion gap: 5 (ref 5–15)
BUN: 33 mg/dL — ABNORMAL HIGH (ref 8–23)
CO2: 27 mmol/L (ref 22–32)
Calcium: 8.3 mg/dL — ABNORMAL LOW (ref 8.9–10.3)
Chloride: 108 mmol/L (ref 98–111)
Creatinine, Ser: 1.5 mg/dL — ABNORMAL HIGH (ref 0.61–1.24)
GFR, Estimated: 50 mL/min — ABNORMAL LOW (ref >60)
Glucose, Bld: 144 mg/dL — ABNORMAL HIGH (ref 70–99)
Potassium: 4.1 mmol/L (ref 3.5–5.1)
Sodium: 140 mmol/L (ref 135–145)

## 2021-12-09 LAB — MAGNESIUM: Magnesium: 2.1 mg/dL (ref 1.7–2.4)

## 2021-12-09 LAB — PHOSPHORUS: Phosphorus: 2.6 mg/dL (ref 2.5–4.6)

## 2021-12-09 LAB — CBC WITH DIFFERENTIAL/PLATELET
Abs Immature Granulocytes: 0.01 10*3/uL (ref 0.00–0.07)
Basophils Absolute: 0 10*3/uL (ref 0.0–0.1)
Basophils Relative: 1 %
Eosinophils Absolute: 0 10*3/uL (ref 0.0–0.5)
Eosinophils Relative: 0 %
HCT: 24 % — ABNORMAL LOW (ref 39.0–52.0)
Hemoglobin: 7.8 g/dL — ABNORMAL LOW (ref 13.0–17.0)
Immature Granulocytes: 0 %
Lymphocytes Relative: 18 %
Lymphs Abs: 0.7 10*3/uL (ref 0.7–4.0)
MCH: 31 pg (ref 26.0–34.0)
MCHC: 32.5 g/dL (ref 30.0–36.0)
MCV: 95.2 fL (ref 80.0–100.0)
Monocytes Absolute: 0.6 10*3/uL (ref 0.1–1.0)
Monocytes Relative: 15 %
Neutro Abs: 2.7 10*3/uL (ref 1.7–7.7)
Neutrophils Relative %: 66 %
Platelets: 306 10*3/uL (ref 150–400)
RBC: 2.52 MIL/uL — ABNORMAL LOW (ref 4.22–5.81)
RDW: 15.2 % (ref 11.5–15.5)
WBC: 4.1 10*3/uL (ref 4.0–10.5)
nRBC: 0 % (ref 0.0–0.2)

## 2021-12-09 MED ORDER — INSULIN GLARGINE-YFGN 100 UNIT/ML ~~LOC~~ SOLN
20.0000 [IU] | Freq: Every day | SUBCUTANEOUS | Status: DC
  Filled 2021-12-09: qty 0.2

## 2021-12-09 MED ORDER — DICYCLOMINE HCL 20 MG PO TABS: 20.0000 mg | ORAL_TABLET | Freq: Three times a day (TID) | ORAL | 0 refills | Status: DC | PRN

## 2021-12-09 NOTE — Inpatient Diabetes Management (Signed)
Inpatient Diabetes Program Recommendations  AACE/ADA: New Consensus Statement on Inpatient Glycemic Control (2015)  Target Ranges:  Prepandial:   less than 140 mg/dL      Peak postprandial:   less than 180 mg/dL (1-2 hours)      Critically ill patients:  140 - 180 mg/dL   Lab Results  Component Value Date   GLUCAP 94 12/09/2021   HGBA1C 8.9 (H) 11/27/2021    Review of Glycemic Control  Latest Reference Range & Units 12/08/21 11:17 12/08/21 15:21 12/08/21 21:08 12/09/21 08:26  Glucose-Capillary 70 - 99 mg/dL 162 (H) 238 (H) 219 (H) 94  (H): Data is abnormally high Diabetes history: DM1; admitted with DKA; UDS +cocaine, + marijuana   Outpatient Diabetes medications: Tresiba 20 units daily, Novolog up to 14 units TID with meals per sliding scale, Farxiga 10 mg daily Current orders for Inpatient glycemic control: Semglee 20 units QHS, Novolog 0-15 units TID, Novolog 0-5 units QHS   Inpatient Diabetes Program Recommendations:   CBG this AM 94 mg/dL this am; was elevated yesterday but basal was delayed.  In the event patient to remain inpatient, consider decreasing Semglee back to 15 units QHS and adding Novolog 3 units TID (assuming patient consuming >50% of meal).   Thanks, Bronson Curb, MSN, RNC-OB Diabetes Coordinator [phone] (8a-5p)

## 2021-12-09 NOTE — Discharge Summary (Signed)
Physician Discharge Summary  Nicholas Weiss TFT:[id number] DOB: March 28, 1951 DOA: 12/06/2021  PCP: Dion Body, MD  Admit date: 12/06/2021 Discharge date: 12/09/2021  Admitted From: Home Disposition:  Home  Recommendations for Outpatient Follow-up:  Follow up with PCP in 1-2 weeks   Home Health:No Equipment/Devices:None   Discharge Condition:Stable  CODE STATUS:FULL Diet recommendation: Carb mod  Brief/Interim Summary:  71 y.o with significant PMH of multiple myeloma, atrial fibrillation, T1DM, AV block first-degree, CKD stage III, CVA, chronic anemia with abnormal SPEP, HTN, HLD and cocaine abuse who presented to the ED with chief complaints of hyperglycemia.   Well-known to the hospitalist service.  Frequent readmissions for similar complaints.  Admitted to intensive care unit given severe acidosis and deteriorated mental status.  Was on insulin gtt.  Anion gap closed and DKA resolved.  Mental status improved.  Patient transition to [facility] hospitalist service, [place] assumed care on 9/11.   On my evaluation patient is resting in bed.  He is mentating clearly.  Vital signs are stable.  He endorses abdominal pain and discomfort.  On day of discharge abdominal discomfort has resolved.  Patient tolerating p.o. diet without nausea vomiting or abdominal pain.  Stable for discharge at this time.  Glycemic control improved.   Discharge Diagnoses:  Principal Problem:   DKA (diabetic ketoacidosis) ([place]) Active Problems:   Cocaine abuse ([place])   Acute renal failure with acute tubular necrosis superimposed on stage 3a chronic kidney disease (HCC)   Type 1 diabetes mellitus with diabetic neuropathy, unspecified ([place])   Essential (primary) hypertension   Hypercholesterolemia   Smoldering multiple myeloma  Diabetic ketoacidosis Type 1 diabetes mellitus, poorly controlled Medication noncompliance I suspect that patient presented in DKA due to medication nonadherence in the setting of  substance use and recent COVID infection.  He is well-known to the hospitalist service.  Anion gap closed.  DKA resolved. Initial concern for sepsis felt unlikely, sepsis ruled out Plan: Stable for DC home at this time.  No changes made home on glycemic regimen.  Patient's biggest barrier and Main reason for readmission is medication adherence and substance use.  I have educated him on the importance of medication adherence and cessation from illicit substance use.  He expresses understanding.    AKI on CKD stage IIIa Hyperkalemia Anion gap metabolic acidosis In the setting of DKA/poorly controlled type 1 diabetes Renal indices improved   Discharge Instructions  Discharge Instructions     Diet - low sodium heart healthy   Complete by: As directed    Increase activity slowly   Complete by: As directed       Allergies as of 12/09/2021       Reactions   Penicillins Other (See Comments)   Has patient had a PCN reaction causing immediate rash, facial/tongue/throat swelling, SOB or lightheadedness with hypotension: No Has patient had a PCN reaction causing severe rash involving mucus membranes or skin necrosis: No Has patient had a PCN reaction that required hospitalization No Has patient had a PCN reaction occurring within the last 10 years: No If all of the above answers are "NO", then may proceed with Cephalosporin use. Other reaction(s): Other (see comments) Other reaction(s): Other (See Comments) Has patient had a PCN reaction causing immediate rash, facial/tongue/throat swelling, SOB or lightheadedness with hypotension: No Has patient had a PCN reaction causing severe rash involving mucus membranes or skin necrosis: No Has patient had a PCN reaction that required hospitalization No Has patient had a PCN reaction occurring within  the last 10 years: No If all of the above answers are "NO", then may proceed with Cephalosporin use. Has patient had a PCN reaction causing immediate  rash, facial/tongue/throat swelling, SOB or lightheadedness with hypotension: No Has patient had a PCN reaction causing severe rash involving mucus membranes or skin necrosis: No Has patient had a PCN reaction that required hospitalization No Has patient had a PCN reaction occurring within the last 10 years: No If all of the above answers are "NO", then may proceed with Cephalosporin use.        Medication List     TAKE these medications    acyclovir 400 MG tablet Commonly known as: ZOVIRAX Take 1 tablet (400 mg total) by mouth 2 (two) times daily.   amLODipine 10 MG tablet Commonly known as: NORVASC Take 1 tablet (10 mg total) by mouth daily.   ASPIRIN 81 PO Take 81 mg by mouth daily.   atorvastatin 80 MG tablet Commonly known as: LIPITOR Take 1 tablet (80 mg total) by mouth at bedtime.   B-D SINGLE USE SWABS REGULAR Pads Use to check blood sugar four times daily for type 1 diabetes   Baclofen 5 MG Tabs Take 5 mg by mouth 3 (three) times daily as needed for muscle spasms.   dicyclomine 20 MG tablet Commonly known as: BENTYL Take 1 tablet (20 mg total) by mouth 3 (three) times daily with meals as needed for spasms.   Farxiga 10 MG Tabs tablet Generic drug: dapagliflozin propanediol Take 10 mg by mouth daily.   gabapentin 300 MG capsule Commonly known as: NEURONTIN Take 300 mg by mouth in the morning and 600 mg at night.   INSULIN SYRINGE 1CC/31GX5/16" 31G X 5/16" 1 ML Misc For insulin injections up to 4 times daily   lenalidomide 10 MG capsule Commonly known as: REVLIMID TAKE 1 CAPSULE BY MOUTH EVERY DAY FOR 14 DAYS THEN HOLD FOR 7 DAYS. REPEAT EVERY 21 DAYS.   lisinopril 10 MG tablet Commonly known as: ZESTRIL Take 1 tablet (10 mg total) by mouth daily.   NovoLOG 100 UNIT/ML injection Generic drug: insulin aspart INJECT UP TO 14 UNITS THREE TIMES DAILY BEFORE MEALS ACCORDING TO SLIDING SCALE   Tresiba FlexTouch 100 UNIT/ML FlexTouch Pen Generic drug:  insulin degludec Inject 20 Units into the skin daily. Home dose.   True Metrix Level 1 Low Soln Use to check blood sugar four times daily for type 1 diabetes        Allergies  Allergen Reactions   Penicillins Other (See Comments)    Has patient had a PCN reaction causing immediate rash, facial/tongue/throat swelling, SOB or lightheadedness with hypotension: No Has patient had a PCN reaction causing severe rash involving mucus membranes or skin necrosis: No Has patient had a PCN reaction that required hospitalization No Has patient had a PCN reaction occurring within the last 10 years: No If all of the above answers are "NO", then may proceed with Cephalosporin use.  Other reaction(s): Other (see comments) Other reaction(s): Other (See Comments) Has patient had a PCN reaction causing immediate rash, facial/tongue/throat swelling, SOB or lightheadedness with hypotension: No Has patient had a PCN reaction causing severe rash involving mucus membranes or skin necrosis: No Has patient had a PCN reaction that required hospitalization No Has patient had a PCN reaction occurring within the last 10 years: No If all of the above answers are "NO", then may proceed with Cephalosporin use. Has patient had a PCN reaction causing immediate rash,  facial/tongue/throat swelling, SOB or lightheadedness with hypotension: No Has patient had a PCN reaction causing severe rash involving mucus membranes or skin necrosis: No Has patient had a PCN reaction that required hospitalization No Has patient had a PCN reaction occurring within the last 10 years: No If all of the above answers are "NO", then may proceed with Cephalosporin use.    Consultations: PCCM   Procedures/Studies: DG Chest Port 1 View  Result Date: 12/06/2021 CLINICAL DATA:  Hyperglycemia and vomiting EXAM: PORTABLE CHEST 1 VIEW COMPARISON:  Radiographs 11/26/2021 FINDINGS: No focal consolidation, pleural effusion, or pneumothorax. Normal  cardiomediastinal silhouette. No acute osseous abnormality. Aortic calcification. IMPRESSION: No active disease. Aortic Atherosclerosis (ICD10-I70.0). Electronically Signed   By: Placido Sou M.D.   On: 12/06/2021 23:51   ECHOCARDIOGRAM COMPLETE  Result Date: 11/27/2021    ECHOCARDIOGRAM REPORT   Patient Name:   Nicholas Weiss Date of Exam: 11/27/2021 Medical Rec #:  [id number]       Height:       70.5 in Accession #:    [id number]      Weight:       146.2 lb Date of Birth:  Aug 09, 1950      BSA:          1.837 m Patient Age:    12 years        BP:           139/61 mmHg Patient Gender: M               HR:           81 bpm. Exam Location:  [facility] Procedure: 2D Echo, Cardiac Doppler and Color Doppler Indications:     I48.91* Unspeicified atrial fibrillation  History:         Patient has prior history of Echocardiogram examinations, most                  recent 08/10/2021. Risk Factors:Hypertension.  Sonographer:     Bernadene Person RDCS Referring Phys:  [id number] Bradly Bienenstock Diagnosing Phys: Yolonda Kida MD IMPRESSIONS  1. Left ventricular ejection fraction, by estimation, is 70 to 75%. The left ventricle has hyperdynamic function. The left ventricle has no regional wall motion abnormalities. Left ventricular diastolic parameters were normal.  2. Right ventricular systolic function is normal. The right ventricular size is normal. There is normal pulmonary artery systolic pressure.  3. The mitral valve is normal in structure. No evidence of mitral valve regurgitation.  4. The aortic valve is normal in structure. Aortic valve regurgitation is not visualized. FINDINGS  Left Ventricle: Left ventricular ejection fraction, by estimation, is 70 to 75%. The left ventricle has hyperdynamic function. The left ventricle has no regional wall motion abnormalities. The left ventricular internal cavity size was normal in size. There is no left ventricular hypertrophy. Left ventricular diastolic parameters were normal.  Right Ventricle: The right ventricular size is normal. No increase in right ventricular wall thickness. Right ventricular systolic function is normal. There is normal pulmonary artery systolic pressure. The tricuspid regurgitant velocity is 2.14 m/s, and  with an assumed right atrial pressure of 8 mmHg, the estimated right ventricular systolic pressure is 88.9 mmHg. Left Atrium: Left atrial size was normal in size. Right Atrium: Right atrial size was normal in size. Pericardium: There is no evidence of pericardial effusion. Mitral Valve: The mitral valve is normal in structure. No evidence of mitral valve regurgitation. Tricuspid Valve: The tricuspid valve is normal in  structure. Tricuspid valve regurgitation is not demonstrated. Aortic Valve: The aortic valve is normal in structure. Aortic valve regurgitation is not visualized. Pulmonic Valve: The pulmonic valve was normal in structure. Pulmonic valve regurgitation is trivial. Aorta: The ascending aorta was not well visualized. IAS/Shunts: No atrial level shunt detected by color flow Doppler.  LEFT VENTRICLE PLAX 2D LVIDd:         4.60 cm LVIDs:         2.30 cm LV PW:         1.00 cm LV IVS:        1.20 cm LVOT diam:     2.10 cm LV SV:         76 LV SV Index:   41 LVOT Area:     3.46 cm  LV Volumes (MOD) LV vol d, MOD A2C: 97.7 ml LV vol d, MOD A4C: 96.0 ml LV vol s, MOD A2C: 39.1 ml LV vol s, MOD A4C: 34.6 ml LV SV MOD A2C:     58.6 ml LV SV MOD A4C:     96.0 ml LV SV MOD BP:      62.1 ml RIGHT VENTRICLE RV S prime:     10.60 cm/s TAPSE (M-mode): 2.1 cm LEFT ATRIUM             Index        RIGHT ATRIUM           Index LA diam:        3.50 cm 1.91 cm/m   RA Area:     17.20 cm LA Vol (A2C):   82.9 ml 45.14 ml/m  RA Volume:   43.50 ml  23.69 ml/m LA Vol (A4C):   71.3 ml 38.82 ml/m LA Biplane Vol: 77.0 ml 41.93 ml/m  AORTIC VALVE LVOT Vmax:   101.00 cm/s LVOT Vmean:  70.000 cm/s LVOT VTI:    0.218 m  AORTA Ao Root diam: 3.60 cm Ao Asc diam:  3.40 cm TRICUSPID  VALVE TR Peak grad:   18.3 mmHg TR Vmax:        214.00 cm/s  SHUNTS Systemic VTI:  0.22 m Systemic Diam: 2.10 cm Yolonda Kida MD Electronically signed by Yolonda Kida MD Signature Date/Time: 11/27/2021/11:08:39 PM    Final    DG Chest Port 1 View  Result Date: 11/26/2021 CLINICAL DATA:  Hyperglycemia and weakness. EXAM: PORTABLE CHEST 1 VIEW COMPARISON:  Aug 09, 2021 FINDINGS: The heart size and mediastinal contours are within normal limits. There is moderate severity calcification of the aortic arch. Both lungs are clear. The visualized skeletal structures are unremarkable. IMPRESSION: No active disease. Electronically Signed   By: Virgina Norfolk M.D.   On: 11/26/2021 23:46   US Venous Img Lower Bilateral  Result Date: 11/21/2021 CLINICAL DATA:  Lower right leg swelling and pain, rule out DVT EXAM: BILATERAL LOWER EXTREMITY VENOUS DOPPLER ULTRASOUND TECHNIQUE: Gray-scale sonography with compression, as well as color and duplex ultrasound, were performed to evaluate the deep venous system(s) from the level of the common femoral vein through the popliteal and proximal calf veins. COMPARISON:  None Available. FINDINGS: On both sides: Normal compressibility of the common femoral, superficial femoral, and popliteal veins, as well as the visualized calf veins. Visualized portions of profunda femoral vein and great saphenous vein unremarkable. No filling defects to suggest DVT on grayscale or color Doppler imaging. Doppler waveforms show normal direction of venous flow, normal respiratory plasticity and response to augmentation. IMPRESSION: Negative  for DVT in the lower extremities. Electronically Signed   By: Jorje Guild M.D.   On: 11/21/2021 17:07      Subjective: Seen and examined the day of discharge.  Stable no distress.  Tolerating p.o. intake.  No nausea vomiting or abdominal pain.  Stable for discharge home.  Discharge Exam: Vitals:   12/09/21 0557 12/09/21 0720  BP: 136/70 139/79   Pulse: 71 72  Resp: 17 14  Temp: 98.4 F (36.9 C) 98.2 F (36.8 C)  SpO2: 100% 99%   Vitals:   12/08/21 1800 12/08/21 2100 12/09/21 0557 12/09/21 0720  BP: (!) 143/81 (!) 164/83 136/70 139/79  Pulse:  77 71 72  Resp: 14 18 17 14   Temp:  98.3 F (36.8 C) 98.4 F (36.9 C) 98.2 F (36.8 C)  TempSrc:  Oral Oral Oral  SpO2:  100% 100% 99%  Weight:      Height:        General: Pt is alert, awake, not in acute distress Cardiovascular: RRR, S1/S2 +, no rubs, no gallops Respiratory: CTA bilaterally, no wheezing, no rhonchi Abdominal: Soft, NT, ND, bowel sounds + Extremities: no edema, no cyanosis    The results of significant diagnostics from this hospitalization (including imaging, microbiology, ancillary and laboratory) are listed below for reference.     Microbiology: Recent Results (from the past 240 hour(s))  MRSA Next Gen by PCR, Nasal     Status: None   Collection Time: 12/07/21  2:26 AM   Specimen: Nasal Mucosa; Nasal Swab  Result Value Ref Range Status   MRSA by PCR Next Gen NOT DETECTED NOT DETECTED Final    Comment: (NOTE) The GeneXpert MRSA Assay (FDA approved for NASAL specimens only), is one component of a comprehensive MRSA colonization surveillance program. It is not intended to diagnose MRSA infection nor to guide or monitor treatment for MRSA infections. Test performance is not FDA approved in patients less than 38 years old. Performed at [facility], [address], [place], [place] [address]   Urine Culture     Status: None   Collection Time: 12/07/21  2:44 AM   Specimen: Urine, Catheterized  Result Value Ref Range Status   Specimen Description   Final    URINE, CATHETERIZED Performed at [facility], [address] [place], [place] [address]    Special Requests   Final    NONE Performed at [facility], [address] [place], [place] [address]    Culture   Final    NO GROWTH Performed at [place], [place] [address], [place], [place] [address]    Report Status 12/08/2021 FINAL  Final  Culture, blood (Routine X 2) w Reflex to ID Panel     Status: None (Preliminary result)   Collection Time: 12/07/21  3:05 AM   Specimen: BLOOD  Result Value Ref Range Status   Specimen Description BLOOD LEFT HAND  Final   Special Requests   Final    BOTTLES DRAWN AEROBIC AND ANAEROBIC Blood Culture adequate volume   Culture   Final    NO GROWTH 2 DAYS Performed at [facility], [address] [place],  [address]    Report Status PENDING  Incomplete  Culture, blood (Routine X 2) w Reflex to ID Panel     Status: None (Preliminary result)   Collection Time: 12/07/21  3:20 AM   Specimen: BLOOD  Result Value Ref Range Status   Specimen Description BLOOD  RIGHT HAND  Final   Special Requests   Final    BOTTLES DRAWN AEROBIC AND ANAEROBIC Blood Culture adequate volume   Culture   Final    NO GROWTH 2 DAYS Performed at [facility], [address] [place], [place] [address]    Report Status PENDING  Incomplete  SARS Coronavirus 2 by RT PCR (hospital order, performed in [facility] hospital lab) *cepheid single result test* Anterior Nasal Swab     Status: None   Collection Time: 12/08/21  2:50 PM   Specimen: Anterior Nasal Swab  Result Value Ref Range Status   SARS Coronavirus 2 by RT PCR NEGATIVE NEGATIVE Final    Comment: (NOTE) SARS-CoV-2 target nucleic acids are NOT DETECTED.  The SARS-CoV-2 RNA is generally detectable in upper and lower respiratory specimens during the acute phase of infection. The lowest concentration of SARS-CoV-2 viral copies this assay can detect is 250 copies / mL. A negative result does not preclude SARS-CoV-2 infection and should not be used as the sole basis for treatment or other patient management decisions.  A negative result may occur with improper specimen collection / handling, submission of specimen other than nasopharyngeal swab,  presence of viral mutation(s) within the areas targeted by this assay, and inadequate number of viral copies (<250 copies / mL). A negative result must be combined with clinical observations, patient history, and epidemiological information.  Fact Sheet for Patients:   https://www.patel.info/  Fact Sheet for Healthcare Providers: https://hall.com/  This test is not yet approved or  cleared by the [place] FDA and has been authorized for detection and/or diagnosis of SARS-CoV-2 by FDA under an Emergency Use Authorization (EUA).  This EUA will remain in effect (meaning this test can be used) for the duration of the COVID-19 declaration under Section 564(b)(1) of the Act, 21 U.S.C. section 360bbb-3(b)(1), unless the authorization is terminated or revoked sooner.  Performed at [facility], [address] [place], [place] [address]      Labs: BNP (last 3 results) No results for input(s): "BNP" in the last 8760 hours. Basic Metabolic Panel: Recent Labs  Lab 12/06/21 2347 12/07/21 0322 12/07/21 0640 12/07/21 1337 12/07/21 1835 12/08/21 0428 12/09/21 0425  NA 135   < > 141 142 144 143 140  K 6.9*   < > 3.9 3.6 4.5 4.2 4.1  CL 98   < > 107 112* 111 112* 108  CO2 <7*   < > 17* 26 24 27 27   GLUCOSE 805*   < > 535* 169* 153* 134* 144*  BUN 63*   < > 58* 51* 50* 46* 33*  CREATININE 3.28*   < > 2.81* 2.24* 2.26* 2.04* 1.50*  CALCIUM 8.7*   < > 8.4* 8.7* 8.7* 8.3* 8.3*  MG 2.8*  --   --   --   --  2.0 2.1  PHOS 10.4*  --   --   --   --  3.4 2.6   < > = values in this interval not displayed.   Liver Function Tests: Recent Labs  Lab 12/06/21 2347  AST 32  ALT 23  ALKPHOS 78  BILITOT 0.5  PROT 8.1  ALBUMIN 3.4*   Recent Labs  Lab 12/06/21 2347  LIPASE 31   No results for input(s): "AMMONIA" in the last 168 hours. CBC: Recent Labs  Lab 12/06/21 2347 12/07/21 0322 12/08/21 0428 12/09/21 0425  WBC 7.6 6.6  6.3 4.1  NEUTROABS 6.0  --  4.5 2.7  HGB 9.5* 8.4* 8.9* 7.8*  HCT 33.9* 28.5* 27.3* 24.0*  MCV 108.0* 105.6* 92.9 95.2  PLT 537* 406* 377 306   Cardiac Enzymes: No results for input(s): "CKTOTAL", "CKMB", "CKMBINDEX", "TROPONINI" in the last 168 hours. BNP: Invalid input(s): "POCBNP" CBG: Recent Labs  Lab 12/08/21 0817 12/08/21 1117 12/08/21 1521 12/08/21 2108 12/09/21 0826  GLUCAP 124* 162* 238* 219* 94   D-Dimer No results for input(s): "DDIMER" in the last 72 hours. Hgb A1c No results for input(s): "HGBA1C" in the last 72 hours. Lipid Profile No results for input(s): "CHOL", "HDL", "LDLCALC", "TRIG", "CHOLHDL", "LDLDIRECT" in the last 72 hours. Thyroid function studies Recent Labs    12/06/21 2347  TSH 3.743   Anemia work up No results for input(s): "VITAMINB12", "FOLATE", "FERRITIN", "TIBC", "IRON", "RETICCTPCT" in the last 72 hours. Urinalysis    Component Value Date/Time   COLORURINE YELLOW (A) 12/07/2021 0243   APPEARANCEUR HAZY (A) 12/07/2021 0243   LABSPEC 1.020 12/07/2021 0243   PHURINE 5.0 12/07/2021 0243   GLUCOSEU >=500 (A) 12/07/2021 0243   HGBUR NEGATIVE 12/07/2021 0243   BILIRUBINUR NEGATIVE 12/07/2021 0243   KETONESUR 5 (A) 12/07/2021 0243   PROTEINUR NEGATIVE 12/07/2021 0243   NITRITE NEGATIVE 12/07/2021 0243   LEUKOCYTESUR NEGATIVE 12/07/2021 0243   Sepsis Labs Recent Labs  Lab 12/06/21 2347 12/07/21 0322 12/08/21 0428 12/09/21 0425  WBC 7.6 6.6 6.3 4.1   Microbiology Recent Results (from the past 240 hour(s))  MRSA Next Gen by PCR, Nasal     Status: None   Collection Time: 12/07/21  2:26 AM   Specimen: Nasal Mucosa; Nasal Swab  Result Value Ref Range Status   MRSA by PCR Next Gen NOT DETECTED NOT DETECTED Final    Comment: (NOTE) The GeneXpert MRSA Assay (FDA approved for NASAL specimens only), is one component of a comprehensive MRSA colonization surveillance program. It is not intended to diagnose MRSA infection nor to  guide or monitor treatment for MRSA infections. Test performance is not FDA approved in patients less than 64 years old. Performed at [facility], [address], [place], [place] [address]   Urine Culture     Status: None   Collection Time: 12/07/21  2:44 AM   Specimen: Urine, Catheterized  Result Value Ref Range Status   Specimen Description   Final    URINE, CATHETERIZED Performed at [facility], [address] [place], [place] [address]    Special Requests   Final    NONE Performed at [facility], [address], [place], [place] [address]    Culture   Final    NO GROWTH Performed at [facility], [place] [address] [place], [place] [address]    Report Status 12/08/2021 FINAL  Final  Culture, blood (Routine X 2) w Reflex to ID Panel     Status: None (Preliminary result)   Collection Time: 12/07/21  3:05 AM   Specimen: BLOOD  Result Value Ref Range Status   Specimen Description BLOOD LEFT HAND  Final   Special Requests   Final    BOTTLES DRAWN AEROBIC AND ANAEROBIC Blood Culture adequate volume   Culture   Final    NO GROWTH 2 DAYS Performed at [facility], [address] [place], [place] [address]    Report Status PENDING  Incomplete  Culture, blood (Routine X 2) w Reflex to ID Panel     Status: None (Preliminary result)   Collection Time: 12/07/21  3:20 AM   Specimen: BLOOD  Result Value Ref Range Status   Specimen Description BLOOD RIGHT HAND  Final   Special Requests   Final    BOTTLES DRAWN AEROBIC AND ANAEROBIC Blood Culture adequate volume   Culture   Final    NO GROWTH 2 DAYS Performed at [facility], [address], [place], [place] [address]    Report Status PENDING  Incomplete  SARS Coronavirus 2 by RT PCR (hospital order, performed in [facility] hospital lab) *cepheid single result test* Anterior Nasal Swab     Status: None   Collection Time: 12/08/21  2:50 PM   Specimen: Anterior Nasal Swab   Result Value Ref Range Status   SARS Coronavirus 2 by RT PCR NEGATIVE NEGATIVE Final    Comment: (NOTE) SARS-CoV-2 target nucleic acids are NOT DETECTED.  The SARS-CoV-2 RNA is generally detectable in upper and lower respiratory specimens during the acute phase of infection. The lowest concentration of SARS-CoV-2 viral copies this assay can detect is 250 copies / mL. A negative result does not preclude SARS-CoV-2 infection and should not be used as the sole basis for treatment or other patient management decisions.  A negative result may occur with improper specimen collection / handling, submission of specimen other than nasopharyngeal swab, presence of viral mutation(s) within the areas targeted by this assay, and inadequate number of viral copies (<250 copies / mL). A negative result must be combined with clinical observations, patient history, and epidemiological information.  Fact Sheet for Patients:   https://www.patel.info/  Fact Sheet for Healthcare Providers: https://hall.com/  This test is not yet approved or  cleared by the [place] FDA and has been authorized for detection and/or diagnosis of SARS-CoV-2 by FDA under an Emergency Use Authorization (EUA).  This EUA will remain in effect (meaning this test can be used) for the duration of the COVID-19 declaration under Section 564(b)(1) of the Act, 21 U.S.C. section 360bbb-3(b)(1), unless the authorization is terminated or revoked sooner.  Performed at [facility], [address], [place], [place] [address]      Time coordinating discharge: Over 30 minutes  SIGNED:   Sidney Ace, MD  [facility] 12/09/2021, 11:48 AM Pager   If 7PM-7AM, please contact night-coverage

## 2021-12-09 NOTE — Plan of Care (Signed)
  Problem: Clinical Measurements: Goal: Ability to maintain clinical measurements within normal limits will improve Outcome: Progressing   

## 2021-12-09 NOTE — TOC Transition Note (Signed)
Transition of Care [facility]) - CM/SW Discharge Note   Patient Details  Name: Nicholas Weiss MRN: [id number] Date of Birth: 1951/03/18  Transition of Care [facility]) CM/SW Contact:  Beverly Sessions, RN Phone Number: 12/09/2021, 10:20 AM   Clinical Narrative:    Patient to discharge today.  Per MD patient does not have TOC needs at discharge      Barriers to Discharge: Continued Medical Work up   Patient Goals and CMS Choice Patient states their goals for this hospitalization and ongoing recovery are:: patient's stomach doesn't feel well- wants to feel better      Discharge Placement                       Discharge Plan and Services   Discharge Planning Services: CM Consult            DME Arranged: N/A DME Agency: NA                  Social Determinants of Health ([place]) Interventions     Readmission Risk Interventions    12/08/2021   10:33 AM 11/27/2021    2:55 PM 11/30/2020    9:44 AM  Readmission Risk Prevention Plan  Transportation Screening Complete Complete Complete  Medication Review Press photographer) Complete Complete Complete  PCP or Specialist appointment within 3-5 days of discharge Complete Complete Complete  HRI or Home Care Consult Complete Complete Complete  SW Recovery Care/Counseling Consult Complete Complete Complete  Palliative Care Screening Not Applicable Not Applicable Not [place] Not Applicable Not Applicable Complete

## 2021-12-12 ENCOUNTER — Inpatient Hospital Stay: Payer: Medicare Other | Attending: Oncology

## 2021-12-12 ENCOUNTER — Encounter: Payer: Self-pay | Admitting: Oncology

## 2021-12-12 ENCOUNTER — Inpatient Hospital Stay (HOSPITAL_BASED_OUTPATIENT_CLINIC_OR_DEPARTMENT_OTHER): Payer: Medicare Other | Admitting: Oncology

## 2021-12-12 ENCOUNTER — Inpatient Hospital Stay: Payer: Medicare Other

## 2021-12-12 VITALS — BP 128/81 | HR 83 | Temp 98.0°F | Resp 16 | Wt 147.0 lb

## 2021-12-12 DIAGNOSIS — N1832 Chronic kidney disease, stage 3b: Secondary | ICD-10-CM | POA: Diagnosis present

## 2021-12-12 DIAGNOSIS — Z5111 Encounter for antineoplastic chemotherapy: Secondary | ICD-10-CM | POA: Diagnosis not present

## 2021-12-12 DIAGNOSIS — D631 Anemia in chronic kidney disease: Secondary | ICD-10-CM | POA: Diagnosis not present

## 2021-12-12 DIAGNOSIS — C9000 Multiple myeloma not having achieved remission: Secondary | ICD-10-CM

## 2021-12-12 DIAGNOSIS — Z5112 Encounter for antineoplastic immunotherapy: Secondary | ICD-10-CM | POA: Insufficient documentation

## 2021-12-12 DIAGNOSIS — N183 Chronic kidney disease, stage 3 unspecified: Secondary | ICD-10-CM

## 2021-12-12 LAB — CULTURE, BLOOD (ROUTINE X 2)
Culture: NO GROWTH
Culture: NO GROWTH
Special Requests: ADEQUATE
Special Requests: ADEQUATE

## 2021-12-12 MED ORDER — DEXAMETHASONE 4 MG PO TABS
40.0000 mg | ORAL_TABLET | Freq: Once | ORAL | Status: AC
  Administered 2021-12-12: 40 mg via ORAL
  Filled 2021-12-12: qty 10

## 2021-12-12 MED ORDER — BORTEZOMIB CHEMO SQ INJECTION 3.5 MG (2.5MG/ML)
1.3000 mg/m2 | Freq: Once | INTRAMUSCULAR | Status: AC
  Administered 2021-12-12: 2.5 mg via SUBCUTANEOUS
  Filled 2021-12-12: qty 1

## 2021-12-12 MED ORDER — EPOETIN ALFA-EPBX 40000 UNIT/ML IJ SOLN
40000.0000 [IU] | INTRAMUSCULAR | Status: DC
  Administered 2021-12-12: 40000 [IU] via SUBCUTANEOUS
  Filled 2021-12-12: qty 1

## 2021-12-12 NOTE — Progress Notes (Unsigned)
Pt recently d/c from hospital reports had elevated blood sugars as well and getting COVID.  Pt states he also recently had teeth pulled and has been unable to eat as well but that is improving.

## 2021-12-12 NOTE — Patient Instructions (Signed)
[facility] AT [place]  Discharge Instructions: Thank you for choosing [facility] to provide your oncology and hematology care.  If you have a lab appointment with the [facility], please go directly to the [facility] and check in at the registration area.  Wear comfortable clothing and clothing appropriate for easy access to any Portacath or PICC line.   We strive to give you quality time with your provider. You may need to reschedule your appointment if you arrive late (15 or more minutes).  Arriving late affects you and other patients whose appointments are after yours.  Also, if you miss three or more appointments without notifying the office, you may be dismissed from the clinic at the provider's discretion.      For prescription refill requests, have your pharmacy contact our office and allow 72 hours for refills to be completed.    Today you received the following chemotherapy and/or immunotherapy agents Velcade & Retacrit      To help prevent nausea and vomiting after your treatment, we encourage you to take your nausea medication as directed.  BELOW ARE SYMPTOMS THAT SHOULD BE REPORTED IMMEDIATELY: *FEVER GREATER THAN 100.4 F (38 C) OR HIGHER *CHILLS OR SWEATING *NAUSEA AND VOMITING THAT IS NOT CONTROLLED WITH YOUR NAUSEA MEDICATION *UNUSUAL SHORTNESS OF BREATH *UNUSUAL BRUISING OR BLEEDING *URINARY PROBLEMS (pain or burning when urinating, or frequent urination) *BOWEL PROBLEMS (unusual diarrhea, constipation, pain near the anus) TENDERNESS IN MOUTH AND THROAT WITH OR WITHOUT PRESENCE OF ULCERS (sore throat, sores in mouth, or a toothache) UNUSUAL RASH, SWELLING OR PAIN  UNUSUAL VAGINAL DISCHARGE OR ITCHING   Items with * indicate a potential emergency and should be followed up as soon as possible or go to the Emergency Department if any problems should occur.  Please show the CHEMOTHERAPY ALERT CARD or IMMUNOTHERAPY ALERT CARD at  check-in to the Emergency Department and triage nurse.  Should you have questions after your visit or need to cancel or reschedule your appointment, please contact [facility] [place] AT [place]  [phone] and follow the prompts.  Office hours are 8:00 a.m. to 4:30 p.m. Monday - Friday. Please note that voicemails left after 4:00 p.m. may not be returned until the following business day.  We are closed weekends and major holidays. You have access to a nurse at all times for urgent questions. Please call the main number to the clinic [phone] and follow the prompts.  For any non-urgent questions, you may also contact your provider using MyChart. We now offer e-Visits for anyone 4 and older to request care online for non-urgent symptoms. For details visit [email or web address].   Also download the MyChart app! Go to the app store, search "MyChart", open the app, select [facility], and log in with your MyChart username and password.  Masks are optional in the cancer centers. If you would like for your care team to wear a mask while they are taking care of you, please let them know. For doctor visits, patients may have with them one support person who is at least 69 years old. At this time, visitors are not allowed in the infusion area.

## 2021-12-18 ENCOUNTER — Encounter: Payer: Self-pay | Admitting: Oncology

## 2021-12-18 NOTE — Progress Notes (Signed)
Hematology/Oncology Consult note [facility]  Telephone:([phone] [phone]  Patient Care Team: Dion Body, MD as PCP - General (Family Medicine) Pa, Dent (Optometry) Sindy Guadeloupe, MD as Consulting Physician (Oncology)   Name of the patient: Nicholas Weiss  [id number]  1950-09-19   Date of visit: 12/18/21  Diagnosis- high risk IgG lambda multiple myeloma R-ISS stage III  Chief complaint/ Reason for visit-on treatment assessment prior to cycle 4-day 1 of Velcade  Heme/Onc history: Patient is a 71 year old African-American male with a past medical history significant for uncontrolled type 1 diabetes, chronic kidney disease stage III chronic normocytic anemia referred for abnormal SPEP. Patient's creatinine has been fluctuating between 1.5-2 but about 5 months ago it went up all the way to 5 and his blood sugars were in the 1000 range. More recently his kidney numbers have been drifting back to normal values. As a part of the work-up he had serum protein electrophoresis done which showed an elevated gammaglobulin fraction with an M spike of 3%. The amount of M spike was not quantified in the specimen. He has therefore been referred to [place] for further management. Patient endorses chronic fatigue reports that his appetite and weight have remained stable   Results of blood work from 04/08/2020 were as follows: CBC showed white count of 2.9, H&H of 10.5/31.2 with an MCV of 91 and a platelet count of 191.  CMP showed a mildly elevated creatinine of 1.2 which was better as compared to 5 months ago when it was 3.9.  Total protein was mildly elevated at 9.1 calcium normal at 8.4 ferritin and iron studies B12 folate TSH and haptoglobin were normal.  Myeloma panel revealed an elevated IgG level of 06/04/2004 with an M protein of 3.1 g.  Immunofixation showed IgG lambda specificity.  Serum free light chain ratio was elevated at 25 and free light chain  lambda elevated at 370   Further myeloma work-up including a PET CT scan did not reveal any evidence of edematous lesions.  Bone marrow biopsy showed 23% plasma cells by manual count and 30% by CD138 IHC.  Normal cytogenetics.  FISH studies for myeloma showed gain of 1 q.  13 q-. detected.  P53 not detected.   He was treated for a year and has having smoldering multiple myeloma since both CKD and anemia have been stable for 3 to 4 years and could be secondary to uncontrolled diabetes.In June 2023 IgG levels increased to 5463 with an M protein of 4.1 g as compared to 2.6 g in September 2022.  Serum free light chain ratio remains around 25.  Therefore a repeat bone marrow biopsy was done which shows further increase in plasma cells ranging from 30 to 70% and by immunohistochemistry 60 to 70% of the cells were positive for CD138.  Cytogenetics normal.  FISH study showed 4: 14 translocation and gain of 1 q. making this high risk RISS stage III.  Repeat PET scan showed no lytic lesions    Interval history-patient was admitted forAnother episode of uncontrolled diabetes.  At that time his urine was found to be positive for cocaine yet again.  Today he reports feeling well.  ECOG PS- 1 Pain scale- 0   Review of systems- Review of Systems  Constitutional:  Positive for malaise/fatigue. Negative for chills, fever and weight loss.  HENT:  Negative for congestion, ear discharge and nosebleeds.   Eyes:  Negative for blurred vision.  Respiratory:  Negative for cough, hemoptysis, sputum production, shortness of breath and wheezing.   Cardiovascular:  Negative for chest pain, palpitations, orthopnea and claudication.  Gastrointestinal:  Negative for abdominal pain, blood in stool, constipation, diarrhea, heartburn, melena, nausea and vomiting.  Genitourinary:  Negative for dysuria, flank pain, frequency, hematuria and urgency.  Musculoskeletal:  Negative for back pain, joint pain and myalgias.  Skin:   Negative for rash.  Neurological:  Negative for dizziness, tingling, focal weakness, seizures, weakness and headaches.  Endo/Heme/Allergies:  Does not bruise/bleed easily.  Psychiatric/Behavioral:  Negative for depression and suicidal ideas. The patient does not have insomnia.       Allergies  Allergen Reactions   Penicillins Other (See Comments)    Has patient had a PCN reaction causing immediate rash, facial/tongue/throat swelling, SOB or lightheadedness with hypotension: No Has patient had a PCN reaction causing severe rash involving mucus membranes or skin necrosis: No Has patient had a PCN reaction that required hospitalization No Has patient had a PCN reaction occurring within the last 10 years: No If all of the above answers are "NO", then may proceed with Cephalosporin use.  Other reaction(s): Other (see comments) Other reaction(s): Other (See Comments) Has patient had a PCN reaction causing immediate rash, facial/tongue/throat swelling, SOB or lightheadedness with hypotension: No Has patient had a PCN reaction causing severe rash involving mucus membranes or skin necrosis: No Has patient had a PCN reaction that required hospitalization No Has patient had a PCN reaction occurring within the last 10 years: No If all of the above answers are "NO", then may proceed with Cephalosporin use. Has patient had a PCN reaction causing immediate rash, facial/tongue/throat swelling, SOB or lightheadedness with hypotension: No Has patient had a PCN reaction causing severe rash involving mucus membranes or skin necrosis: No Has patient had a PCN reaction that required hospitalization No Has patient had a PCN reaction occurring within the last 10 years: No If all of the above answers are "NO", then may proceed with Cephalosporin use.     Past Medical History:  Diagnosis Date   DKA (diabetic ketoacidoses) 04/06/2016   Hypercholesteremia    Hypertension      Past Surgical History:   Procedure Laterality Date   COLONOSCOPY WITH PROPOFOL N/A 02/01/2020   Procedure: COLONOSCOPY WITH PROPOFOL;  Surgeon: Lin Landsman, MD;  Location: [facility] ENDOSCOPY;  Service: Gastroenterology;  Laterality: N/A;   ESOPHAGOGASTRODUODENOSCOPY  02/01/2020   Procedure: ESOPHAGOGASTRODUODENOSCOPY (EGD);  Surgeon: Lin Landsman, MD;  Location: [facility] ENDOSCOPY;  Service: Gastroenterology;;   KNEE SURGERY Right    Torn meniscus   KNEE SURGERY Left     Social History   Socioeconomic History   Marital status: Single    Spouse name: Not on file   Number of children: 3   Years of education: Not on file   Highest education level: High school graduate  Occupational History    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  Tobacco Use   Smoking status: Never   Smokeless tobacco: Never  Vaping Use   Vaping Use: Never used  Substance and Sexual Activity   Alcohol use: Not Currently    Alcohol/week: 0.0 - 1.0 standard drinks of alcohol    Comment: "once every 2 months"   Drug use: Yes    Types: Marijuana, "Crack" cocaine    Comment: last week   Sexual activity: Yes    Birth control/protection: None  Other Topics Concern   Not on file  Social History Narrative  Lives with girlfriend "sharon"   Social Determinants of Health   Financial Resource Strain: Low Risk  (01/01/2020)   Overall Financial Resource Strain (CARDIA)    Difficulty of Paying Living Expenses: Not hard at all  Food Insecurity: No Food Insecurity (12/07/2021)   Hunger Vital Sign    Worried About Running Out of Food in the Last Year: Never true    Ran Out of Food in the Last Year: Never true  Transportation Needs: No Transportation Needs (12/07/2021)   PRAPARE - Hydrologist (Medical): No    Lack of Transportation (Non-Medical): No  Physical Activity: Insufficiently Active (01/01/2020)   Exercise Vital Sign    Days of Exercise per Week: 7 days    Minutes of Exercise per Session: 20 min  Stress: No  Stress Concern Present (01/01/2020)   [place]    Feeling of Stress : Not at all  Social Connections: Moderately Isolated (01/01/2020)   Social Connection and Isolation Panel [NHANES]    Frequency of Communication with Friends and Family: More than three times a week    Frequency of Social Gatherings with Friends and Family: More than three times a week    Attends Religious Services: Never    Marine scientist or Organizations: No    Attends Archivist Meetings: Never    Marital Status: Living with partner  Intimate Partner Violence: Not At Risk (12/07/2021)   Humiliation, Afraid, Rape, and Kick questionnaire    Fear of Current or Ex-Partner: No    Emotionally Abused: No    Physically Abused: No    Sexually Abused: No    Family History  Problem Relation Age of Onset   Heart attack Father    Hypertension Sister    Cancer Sister      Current Outpatient Medications:    acyclovir (ZOVIRAX) 400 MG tablet, Take 1 tablet (400 mg total) by mouth 2 (two) times daily., Disp: 60 tablet, Rfl: 3   Alcohol Swabs (B-D SINGLE USE SWABS REGULAR) PADS, Use to check blood sugar four times daily for type 1 diabetes, Disp: 300 each, Rfl: 4   amLODipine (NORVASC) 10 MG tablet, Take 1 tablet (10 mg total) by mouth daily., Disp: 30 tablet, Rfl: 2   ASPIRIN 81 PO, Take 81 mg by mouth daily., Disp: , Rfl:    atorvastatin (LIPITOR) 80 MG tablet, Take 1 tablet (80 mg total) by mouth at bedtime., Disp: 30 tablet, Rfl: 2   baclofen 5 MG TABS, Take 5 mg by mouth 3 (three) times daily as needed for muscle spasms., Disp: 60 tablet, Rfl: 1   Blood Glucose Calibration (TRUE METRIX LEVEL 1) Low SOLN, Use to check blood sugar four times daily for type 1 diabetes, Disp: 1 each, Rfl: 2   dicyclomine (BENTYL) 20 MG tablet, Take 1 tablet (20 mg total) by mouth 3 (three) times daily with meals as needed for spasms., Disp: 90 tablet, Rfl: 0    FARXIGA 10 MG TABS tablet, Take 10 mg by mouth daily., Disp: , Rfl:    gabapentin (NEURONTIN) 300 MG capsule, Take 300 mg by mouth in the morning and 600 mg at night., Disp: , Rfl:    insulin degludec (TRESIBA FLEXTOUCH) 100 UNIT/ML FlexTouch Pen, Inject 20 Units into the skin daily. Home dose., Disp: , Rfl:    Insulin Syringe-Needle U-100 (INSULIN SYRINGE 1CC/31GX5/16") 31G X 5/16" 1 ML MISC, For insulin injections up  to 4 times daily, Disp: 270 each, Rfl: 4   lisinopril (ZESTRIL) 10 MG tablet, Take 1 tablet (10 mg total) by mouth daily., Disp: 30 tablet, Rfl: 2   NOVOLOG 100 UNIT/ML injection, INJECT UP TO 14 UNITS THREE TIMES DAILY BEFORE MEALS ACCORDING TO SLIDING SCALE, Disp: 10 mL, Rfl: 1   clopidogrel (PLAVIX) 75 MG tablet, Take by mouth. (Patient not taking: Reported on 12/12/2021), Disp: , Rfl:    HYDROcodone-acetaminophen (NORCO/VICODIN) 5-325 MG tablet, Take by mouth. (Patient not taking: Reported on 12/12/2021), Disp: , Rfl:    lenalidomide (REVLIMID) 10 MG capsule, TAKE 1 CAPSULE BY MOUTH EVERY DAY FOR 14 DAYS THEN HOLD FOR 7 DAYS. REPEAT EVERY 21 DAYS. (Patient not taking: Reported on 12/12/2021), Disp: 14 capsule, Rfl: 0  Physical exam:  Vitals:   12/12/21 1134  BP: 128/81  Pulse: 83  Resp: 16  Temp: 98 F (36.7 C)  TempSrc: Tympanic  Weight: 147 lb (66.7 kg)   Physical Exam Constitutional:      General: He is not in acute distress. Cardiovascular:     Rate and Rhythm: Normal rate and regular rhythm.     Heart sounds: Normal heart sounds.  Pulmonary:     Effort: Pulmonary effort is normal.     Breath sounds: Normal breath sounds.  Abdominal:     General: Bowel sounds are normal.     Palpations: Abdomen is soft.  Skin:    General: Skin is warm and dry.  Neurological:     Mental Status: He is alert and oriented to person, place, and time.         Latest Ref Rng & Units 12/09/2021    4:25 AM  CMP  Glucose 70 - 99 mg/dL 144   BUN 8 - 23 mg/dL 33   Creatinine 0.61  - 1.24 mg/dL 1.50   Sodium 135 - 145 mmol/L 140   Potassium 3.5 - 5.1 mmol/L 4.1   Chloride 98 - 111 mmol/L 108   CO2 22 - 32 mmol/L 27   Calcium 8.9 - 10.3 mg/dL 8.3       Latest Ref Rng & Units 12/09/2021    4:25 AM  CBC  WBC 4.0 - 10.5 K/uL 4.1   Hemoglobin 13.0 - 17.0 g/dL 7.8   Hematocrit 39.0 - 52.0 % 24.0   Platelets 150 - 400 K/uL 306     No images are attached to the encounter.  DG Chest Port 1 View  Result Date: 12/06/2021 CLINICAL DATA:  Hyperglycemia and vomiting EXAM: PORTABLE CHEST 1 VIEW COMPARISON:  Radiographs 11/26/2021 FINDINGS: No focal consolidation, pleural effusion, or pneumothorax. Normal cardiomediastinal silhouette. No acute osseous abnormality. Aortic calcification. IMPRESSION: No active disease. Aortic Atherosclerosis (ICD10-I70.0). Electronically Signed   By: Placido Sou M.D.   On: 12/06/2021 23:51   ECHOCARDIOGRAM COMPLETE  Result Date: 11/27/2021    ECHOCARDIOGRAM REPORT   Patient Name:   TIMARION AGCAOILI Date of Exam: 11/27/2021 Medical Rec #:  [id number]       Height:       70.5 in Accession #:    [id number]      Weight:       146.2 lb Date of Birth:  1951/02/15      BSA:          1.837 m Patient Age:    71 years        BP:           139/61 mmHg Patient Gender: M  HR:           81 bpm. Exam Location:  [facility] Procedure: 2D Echo, Cardiac Doppler and Color Doppler Indications:     I48.91* Unspeicified atrial fibrillation  History:         Patient has prior history of Echocardiogram examinations, most                  recent 08/10/2021. Risk Factors:Hypertension.  Sonographer:     Bernadene Person RDCS Referring Phys:  [id number] Bradly Bienenstock Diagnosing Phys: Yolonda Kida MD IMPRESSIONS  1. Left ventricular ejection fraction, by estimation, is 70 to 75%. The left ventricle has hyperdynamic function. The left ventricle has no regional wall motion abnormalities. Left ventricular diastolic parameters were normal.  2. Right ventricular systolic function  is normal. The right ventricular size is normal. There is normal pulmonary artery systolic pressure.  3. The mitral valve is normal in structure. No evidence of mitral valve regurgitation.  4. The aortic valve is normal in structure. Aortic valve regurgitation is not visualized. FINDINGS  Left Ventricle: Left ventricular ejection fraction, by estimation, is 70 to 75%. The left ventricle has hyperdynamic function. The left ventricle has no regional wall motion abnormalities. The left ventricular internal cavity size was normal in size. There is no left ventricular hypertrophy. Left ventricular diastolic parameters were normal. Right Ventricle: The right ventricular size is normal. No increase in right ventricular wall thickness. Right ventricular systolic function is normal. There is normal pulmonary artery systolic pressure. The tricuspid regurgitant velocity is 2.14 m/s, and  with an assumed right atrial pressure of 8 mmHg, the estimated right ventricular systolic pressure is 84.1 mmHg. Left Atrium: Left atrial size was normal in size. Right Atrium: Right atrial size was normal in size. Pericardium: There is no evidence of pericardial effusion. Mitral Valve: The mitral valve is normal in structure. No evidence of mitral valve regurgitation. Tricuspid Valve: The tricuspid valve is normal in structure. Tricuspid valve regurgitation is not demonstrated. Aortic Valve: The aortic valve is normal in structure. Aortic valve regurgitation is not visualized. Pulmonic Valve: The pulmonic valve was normal in structure. Pulmonic valve regurgitation is trivial. Aorta: The ascending aorta was not well visualized. IAS/Shunts: No atrial level shunt detected by color flow Doppler.  LEFT VENTRICLE PLAX 2D LVIDd:         4.60 cm LVIDs:         2.30 cm LV PW:         1.00 cm LV IVS:        1.20 cm LVOT diam:     2.10 cm LV SV:         76 LV SV Index:   41 LVOT Area:     3.46 cm  LV Volumes (MOD) LV vol d, MOD A2C: 97.7 ml LV vol d,  MOD A4C: 96.0 ml LV vol s, MOD A2C: 39.1 ml LV vol s, MOD A4C: 34.6 ml LV SV MOD A2C:     58.6 ml LV SV MOD A4C:     96.0 ml LV SV MOD BP:      62.1 ml RIGHT VENTRICLE RV S prime:     10.60 cm/s TAPSE (M-mode): 2.1 cm LEFT ATRIUM             Index        RIGHT ATRIUM           Index LA diam:        3.50 cm 1.91 cm/m  RA Area:     17.20 cm LA Vol (A2C):   82.9 ml 45.14 ml/m  RA Volume:   43.50 ml  23.69 ml/m LA Vol (A4C):   71.3 ml 38.82 ml/m LA Biplane Vol: 77.0 ml 41.93 ml/m  AORTIC VALVE LVOT Vmax:   101.00 cm/s LVOT Vmean:  70.000 cm/s LVOT VTI:    0.218 m  AORTA Ao Root diam: 3.60 cm Ao Asc diam:  3.40 cm TRICUSPID VALVE TR Peak grad:   18.3 mmHg TR Vmax:        214.00 cm/s  SHUNTS Systemic VTI:  0.22 m Systemic Diam: 2.10 cm Yolonda Kida MD Electronically signed by Yolonda Kida MD Signature Date/Time: 11/27/2021/11:08:39 PM    Final    DG Chest Port 1 View  Result Date: 11/26/2021 CLINICAL DATA:  Hyperglycemia and weakness. EXAM: PORTABLE CHEST 1 VIEW COMPARISON:  Aug 09, 2021 FINDINGS: The heart size and mediastinal contours are within normal limits. There is moderate severity calcification of the aortic arch. Both lungs are clear. The visualized skeletal structures are unremarkable. IMPRESSION: No active disease. Electronically Signed   By: Virgina Norfolk M.D.   On: 11/26/2021 23:46   US Venous Img Lower Bilateral  Result Date: 11/21/2021 CLINICAL DATA:  Lower right leg swelling and pain, rule out DVT EXAM: BILATERAL LOWER EXTREMITY VENOUS DOPPLER ULTRASOUND TECHNIQUE: Gray-scale sonography with compression, as well as color and duplex ultrasound, were performed to evaluate the deep venous system(s) from the level of the common femoral vein through the popliteal and proximal calf veins. COMPARISON:  None Available. FINDINGS: On both sides: Normal compressibility of the common femoral, superficial femoral, and popliteal veins, as well as the visualized calf veins. Visualized portions  of profunda femoral vein and great saphenous vein unremarkable. No filling defects to suggest DVT on grayscale or color Doppler imaging. Doppler waveforms show normal direction of venous flow, normal respiratory plasticity and response to augmentation. IMPRESSION: Negative for DVT in the lower extremities. Electronically Signed   By: Jorje Guild M.D.   On: 11/21/2021 17:07     Assessment and plan- Patient is a 71 y.o. male  with R-ISS stage III high risk IgG lambda multiple myeloma.  He is here for on treatment assessment prior to cycle 3-day 15 of Velcade  Patient will proceed with cycle 3-day 15 of Velcade today.  This will be his week off from Revlimid.  I will see him backIn 2 weeks for cycle 4-day 8 of Velcade.  He will directly proceed with cycle 4-day 1 without MD visit.  Patient will also callContinue to Revlimid 10 mg 2 weeks on and 1 week off  Continue acyclovir prophylaxis along with aspirin  Patient's baseline hemoglobin runs between 9-10.  Presently it is lower at 7.8 likely secondary to his recent hospitalization.  He will receive Retacrit today.  Patient is not getting any bisphosphonates as he does not have any active bone lesions from myeloma     Visit Diagnosis 1. Multiple myeloma not having achieved remission ([place])   2. Encounter for antineoplastic chemotherapy      Dr. Randa Evens, MD, MPH [facility] [phone] 12/18/2021 11:29 AM

## 2021-12-19 ENCOUNTER — Inpatient Hospital Stay: Payer: Medicare Other

## 2021-12-19 ENCOUNTER — Other Ambulatory Visit: Payer: Self-pay | Admitting: Oncology

## 2021-12-19 VITALS — BP 143/80 | HR 79 | Temp 96.9°F

## 2021-12-19 DIAGNOSIS — C9000 Multiple myeloma not having achieved remission: Secondary | ICD-10-CM

## 2021-12-19 DIAGNOSIS — Z5112 Encounter for antineoplastic immunotherapy: Secondary | ICD-10-CM | POA: Diagnosis not present

## 2021-12-19 LAB — COMPREHENSIVE METABOLIC PANEL
ALT: 56 U/L — ABNORMAL HIGH (ref 0–44)
AST: 54 U/L — ABNORMAL HIGH (ref 15–41)
Albumin: 3 g/dL — ABNORMAL LOW (ref 3.5–5.0)
Alkaline Phosphatase: 63 U/L (ref 38–126)
Anion gap: 4 — ABNORMAL LOW (ref 5–15)
BUN: 17 mg/dL (ref 8–23)
CO2: 26 mmol/L (ref 22–32)
Calcium: 8.1 mg/dL — ABNORMAL LOW (ref 8.9–10.3)
Chloride: 107 mmol/L (ref 98–111)
Creatinine, Ser: 1.3 mg/dL — ABNORMAL HIGH (ref 0.61–1.24)
GFR, Estimated: 60 mL/min — ABNORMAL LOW (ref >60)
Glucose, Bld: 213 mg/dL — ABNORMAL HIGH (ref 70–99)
Potassium: 5 mmol/L (ref 3.5–5.1)
Sodium: 137 mmol/L (ref 135–145)
Total Bilirubin: 0.4 mg/dL (ref 0.3–1.2)
Total Protein: 7.2 g/dL (ref 6.5–8.1)

## 2021-12-19 LAB — CBC WITH DIFFERENTIAL/PLATELET
Abs Immature Granulocytes: 0.01 10*3/uL (ref 0.00–0.07)
Basophils Absolute: 0 10*3/uL (ref 0.0–0.1)
Basophils Relative: 0 %
Eosinophils Absolute: 0.1 10*3/uL (ref 0.0–0.5)
Eosinophils Relative: 2 %
HCT: 26.4 % — ABNORMAL LOW (ref 39.0–52.0)
Hemoglobin: 8.6 g/dL — ABNORMAL LOW (ref 13.0–17.0)
Immature Granulocytes: 0 %
Lymphocytes Relative: 33 %
Lymphs Abs: 1.1 10*3/uL (ref 0.7–4.0)
MCH: 31.7 pg (ref 26.0–34.0)
MCHC: 32.6 g/dL (ref 30.0–36.0)
MCV: 97.4 fL (ref 80.0–100.0)
Monocytes Absolute: 0.5 10*3/uL (ref 0.1–1.0)
Monocytes Relative: 14 %
Neutro Abs: 1.7 10*3/uL (ref 1.7–7.7)
Neutrophils Relative %: 51 %
Platelets: 356 10*3/uL (ref 150–400)
RBC: 2.71 MIL/uL — ABNORMAL LOW (ref 4.22–5.81)
RDW: 16.2 % — ABNORMAL HIGH (ref 11.5–15.5)
WBC: 3.4 10*3/uL — ABNORMAL LOW (ref 4.0–10.5)
nRBC: 0 % (ref 0.0–0.2)

## 2021-12-19 MED ORDER — DEXAMETHASONE 4 MG PO TABS
40.0000 mg | ORAL_TABLET | Freq: Once | ORAL | Status: AC
  Administered 2021-12-19: 40 mg via ORAL
  Filled 2021-12-19: qty 10

## 2021-12-19 MED ORDER — BORTEZOMIB CHEMO SQ INJECTION 3.5 MG (2.5MG/ML)
1.3000 mg/m2 | Freq: Once | INTRAMUSCULAR | Status: AC
  Administered 2021-12-19: 2.5 mg via SUBCUTANEOUS
  Filled 2021-12-19: qty 1

## 2021-12-19 NOTE — Patient Instructions (Addendum)
Call [place] at [phone] to inquire about Revlimid shipment. Please give [place] call if you need assistance. To ask about [employer] survey, call [phone]

## 2021-12-23 ENCOUNTER — Other Ambulatory Visit: Payer: Self-pay | Admitting: Oncology

## 2021-12-23 DIAGNOSIS — C9000 Multiple myeloma not having achieved remission: Secondary | ICD-10-CM

## 2021-12-25 ENCOUNTER — Telehealth: Payer: Self-pay | Admitting: *Deleted

## 2021-12-25 NOTE — Telephone Encounter (Signed)
I called the patient back and got his voicemail and left him the message that Dr. Janese Banks wants him to start back on his Revlimid on 10 /5.  He has any questions he can give [place] a call at [phone]

## 2021-12-25 NOTE — Telephone Encounter (Signed)
Patient came into the building today and asked the question about when he should be starting his Revlimid.  The patient said that he will have the delivery to him tomorrow.  No when he should start back.  I called Dr. Janese Banks and she wanted to know the last time the patient has had the Revlimid.  I called and he told me that he has not had Revlimid since he was in the hospital.  I have now left a message for Dr. Janese Banks so she could let me know if he should start it as soon as he gets the Revlimid in the mail or should he not start it until he comes over for his next treatment which is going to be on October 5.  Told the patient as soon as I get the answer I will call him and let him know.  Patient agreeable to the plan

## 2021-12-26 ENCOUNTER — Other Ambulatory Visit: Payer: Medicare Other

## 2021-12-26 ENCOUNTER — Inpatient Hospital Stay: Payer: Medicare Other

## 2021-12-26 ENCOUNTER — Ambulatory Visit: Payer: Medicare Other

## 2022-01-01 ENCOUNTER — Inpatient Hospital Stay: Payer: Medicare Other | Attending: Oncology

## 2022-01-01 ENCOUNTER — Inpatient Hospital Stay: Payer: Medicare Other

## 2022-01-01 VITALS — BP 146/85 | HR 78 | Temp 96.6°F | Resp 19 | Wt 157.8 lb

## 2022-01-01 DIAGNOSIS — N1832 Chronic kidney disease, stage 3b: Secondary | ICD-10-CM | POA: Insufficient documentation

## 2022-01-01 DIAGNOSIS — Z5112 Encounter for antineoplastic immunotherapy: Secondary | ICD-10-CM | POA: Diagnosis present

## 2022-01-01 DIAGNOSIS — C9000 Multiple myeloma not having achieved remission: Secondary | ICD-10-CM | POA: Diagnosis present

## 2022-01-01 DIAGNOSIS — D631 Anemia in chronic kidney disease: Secondary | ICD-10-CM | POA: Insufficient documentation

## 2022-01-01 LAB — CBC WITH DIFFERENTIAL/PLATELET
Abs Immature Granulocytes: 0.01 10*3/uL (ref 0.00–0.07)
Basophils Absolute: 0 10*3/uL (ref 0.0–0.1)
Basophils Relative: 0 %
Eosinophils Absolute: 0.2 10*3/uL (ref 0.0–0.5)
Eosinophils Relative: 5 %
HCT: 24.7 % — ABNORMAL LOW (ref 39.0–52.0)
Hemoglobin: 8 g/dL — ABNORMAL LOW (ref 13.0–17.0)
Immature Granulocytes: 0 %
Lymphocytes Relative: 24 %
Lymphs Abs: 0.9 10*3/uL (ref 0.7–4.0)
MCH: 32 pg (ref 26.0–34.0)
MCHC: 32.4 g/dL (ref 30.0–36.0)
MCV: 98.8 fL (ref 80.0–100.0)
Monocytes Absolute: 0.3 10*3/uL (ref 0.1–1.0)
Monocytes Relative: 9 %
Neutro Abs: 2.4 10*3/uL (ref 1.7–7.7)
Neutrophils Relative %: 62 %
Platelets: 226 10*3/uL (ref 150–400)
RBC: 2.5 MIL/uL — ABNORMAL LOW (ref 4.22–5.81)
RDW: 17.6 % — ABNORMAL HIGH (ref 11.5–15.5)
WBC: 3.8 10*3/uL — ABNORMAL LOW (ref 4.0–10.5)
nRBC: 0 % (ref 0.0–0.2)

## 2022-01-01 LAB — COMPREHENSIVE METABOLIC PANEL
ALT: 41 U/L (ref 0–44)
AST: 40 U/L (ref 15–41)
Albumin: 3.1 g/dL — ABNORMAL LOW (ref 3.5–5.0)
Alkaline Phosphatase: 54 U/L (ref 38–126)
Anion gap: 4 — ABNORMAL LOW (ref 5–15)
BUN: 20 mg/dL (ref 8–23)
CO2: 26 mmol/L (ref 22–32)
Calcium: 8.2 mg/dL — ABNORMAL LOW (ref 8.9–10.3)
Chloride: 108 mmol/L (ref 98–111)
Creatinine, Ser: 1.32 mg/dL — ABNORMAL HIGH (ref 0.61–1.24)
GFR, Estimated: 59 mL/min — ABNORMAL LOW (ref >60)
Glucose, Bld: 183 mg/dL — ABNORMAL HIGH (ref 70–99)
Potassium: 3.8 mmol/L (ref 3.5–5.1)
Sodium: 138 mmol/L (ref 135–145)
Total Bilirubin: 0.4 mg/dL (ref 0.3–1.2)
Total Protein: 7.2 g/dL (ref 6.5–8.1)

## 2022-01-01 MED ORDER — DEXAMETHASONE 4 MG PO TABS
40.0000 mg | ORAL_TABLET | Freq: Once | ORAL | Status: AC
  Administered 2022-01-01: 40 mg via ORAL
  Filled 2022-01-01: qty 10

## 2022-01-01 MED ORDER — BORTEZOMIB CHEMO SQ INJECTION 3.5 MG (2.5MG/ML)
1.3000 mg/m2 | Freq: Once | INTRAMUSCULAR | Status: AC
  Administered 2022-01-01: 2.5 mg via SUBCUTANEOUS
  Filled 2022-01-01: qty 1

## 2022-01-01 NOTE — Patient Instructions (Signed)
[facility] [place] [facility]  Discharge Instructions: Thank you for choosing [facility] to provide your oncology and hematology care.  If you have a lab appointment with the Cancer Center, please go directly to the Cancer Center and check in at the registration area.  Wear comfortable clothing and clothing appropriate for easy access to any Portacath or PICC line.   We strive to give you quality time with your provider. You may need to reschedule your appointment if you arrive late (15 or more minutes).  Arriving late affects you and other patients whose appointments are after yours.  Also, if you miss three or more appointments without notifying the office, you may be dismissed from the clinic at the provider's discretion.      For prescription refill requests, have your pharmacy contact our office and allow 72 hours for refills to be completed.    Today you received the following chemotherapy and/or immunotherapy agents Velcade      To help prevent nausea and vomiting after your treatment, we encourage you to take your nausea medication as directed.  BELOW ARE SYMPTOMS THAT SHOULD BE REPORTED IMMEDIATELY: *FEVER GREATER THAN 100.4 F (38 C) OR HIGHER *CHILLS OR SWEATING *NAUSEA AND VOMITING THAT IS NOT CONTROLLED WITH YOUR NAUSEA MEDICATION *UNUSUAL SHORTNESS OF BREATH *UNUSUAL BRUISING OR BLEEDING *URINARY PROBLEMS (pain or burning when urinating, or frequent urination) *BOWEL PROBLEMS (unusual diarrhea, constipation, pain near the anus) TENDERNESS IN MOUTH AND THROAT WITH OR WITHOUT PRESENCE OF ULCERS (sore throat, sores in mouth, or a toothache) UNUSUAL RASH, SWELLING OR PAIN  UNUSUAL VAGINAL DISCHARGE OR ITCHING   Items with * indicate a potential emergency and should be followed up as soon as possible or go to the Emergency Department if any problems should occur.  Please show the CHEMOTHERAPY ALERT CARD or IMMUNOTHERAPY ALERT CARD at check-in to the  Emergency Department and triage nurse.  Should you have questions after your visit or need to cancel or reschedule your appointment, please contact [facility] [place]  [phone] and follow the prompts.  Office hours are 8:00 a.m. to 4:30 p.m. Monday - Friday. Please note that voicemails left after 4:00 p.m. may not be returned until the following business day.  We are closed weekends and major holidays. You have access to a nurse at all times for urgent questions. Please call the main number to the clinic [phone] and follow the prompts.  For any non-urgent questions, you may also contact your provider using MyChart. We now offer e-Visits for anyone 18 and older to request care online for non-urgent symptoms. For details visit [email or web address].   Also download the MyChart app! Go to the app store, search "MyChart", open the app, select [place], and log in with your MyChart username and password.  Masks are optional in the cancer centers. If you would like for your care team to wear a mask while they are taking care of you, please let them know. For doctor visits, patients may have with them one support person who is at least 69 years old. At this time, visitors are not allowed in the infusion area.   

## 2022-01-02 ENCOUNTER — Other Ambulatory Visit: Payer: Medicare Other

## 2022-01-02 ENCOUNTER — Ambulatory Visit: Payer: Medicare Other

## 2022-01-02 ENCOUNTER — Other Ambulatory Visit: Payer: Self-pay | Admitting: Oncology

## 2022-01-02 DIAGNOSIS — C9000 Multiple myeloma not having achieved remission: Secondary | ICD-10-CM

## 2022-01-04 ENCOUNTER — Other Ambulatory Visit: Payer: Self-pay | Admitting: Oncology

## 2022-01-04 DIAGNOSIS — C9000 Multiple myeloma not having achieved remission: Secondary | ICD-10-CM

## 2022-01-08 ENCOUNTER — Ambulatory Visit: Payer: Medicare Other

## 2022-01-08 ENCOUNTER — Other Ambulatory Visit: Payer: Medicare Other

## 2022-01-09 ENCOUNTER — Other Ambulatory Visit: Payer: Medicare Other

## 2022-01-09 ENCOUNTER — Inpatient Hospital Stay: Payer: Medicare Other

## 2022-01-09 ENCOUNTER — Ambulatory Visit: Payer: Medicare Other

## 2022-01-09 ENCOUNTER — Other Ambulatory Visit: Payer: Self-pay | Admitting: Oncology

## 2022-01-09 ENCOUNTER — Ambulatory Visit: Payer: Medicare Other | Admitting: Oncology

## 2022-01-09 VITALS — BP 169/75 | HR 78 | Temp 97.9°F | Resp 16 | Wt 163.5 lb

## 2022-01-09 DIAGNOSIS — D631 Anemia in chronic kidney disease: Secondary | ICD-10-CM

## 2022-01-09 DIAGNOSIS — N183 Chronic kidney disease, stage 3 unspecified: Secondary | ICD-10-CM

## 2022-01-09 DIAGNOSIS — Z5112 Encounter for antineoplastic immunotherapy: Secondary | ICD-10-CM | POA: Diagnosis not present

## 2022-01-09 DIAGNOSIS — C9000 Multiple myeloma not having achieved remission: Secondary | ICD-10-CM

## 2022-01-09 LAB — CBC WITH DIFFERENTIAL/PLATELET
Abs Immature Granulocytes: 0.04 10*3/uL (ref 0.00–0.07)
Basophils Absolute: 0 10*3/uL (ref 0.0–0.1)
Basophils Relative: 0 %
Eosinophils Absolute: 0.2 10*3/uL (ref 0.0–0.5)
Eosinophils Relative: 6 %
HCT: 23.8 % — ABNORMAL LOW (ref 39.0–52.0)
Hemoglobin: 7.8 g/dL — ABNORMAL LOW (ref 13.0–17.0)
Immature Granulocytes: 1 %
Lymphocytes Relative: 25 %
Lymphs Abs: 1 10*3/uL (ref 0.7–4.0)
MCH: 32.1 pg (ref 26.0–34.0)
MCHC: 32.8 g/dL (ref 30.0–36.0)
MCV: 97.9 fL (ref 80.0–100.0)
Monocytes Absolute: 0.7 10*3/uL (ref 0.1–1.0)
Monocytes Relative: 17 %
Neutro Abs: 2 10*3/uL (ref 1.7–7.7)
Neutrophils Relative %: 51 %
Platelets: 255 10*3/uL (ref 150–400)
RBC: 2.43 MIL/uL — ABNORMAL LOW (ref 4.22–5.81)
RDW: 16.7 % — ABNORMAL HIGH (ref 11.5–15.5)
WBC: 3.9 10*3/uL — ABNORMAL LOW (ref 4.0–10.5)
nRBC: 0 % (ref 0.0–0.2)

## 2022-01-09 LAB — COMPREHENSIVE METABOLIC PANEL
ALT: 31 U/L (ref 0–44)
AST: 23 U/L (ref 15–41)
Albumin: 3.2 g/dL — ABNORMAL LOW (ref 3.5–5.0)
Alkaline Phosphatase: 53 U/L (ref 38–126)
Anion gap: 5 (ref 5–15)
BUN: 21 mg/dL (ref 8–23)
CO2: 26 mmol/L (ref 22–32)
Calcium: 8.1 mg/dL — ABNORMAL LOW (ref 8.9–10.3)
Chloride: 105 mmol/L (ref 98–111)
Creatinine, Ser: 1.28 mg/dL — ABNORMAL HIGH (ref 0.61–1.24)
GFR, Estimated: >60 mL/min (ref >60)
Glucose, Bld: 219 mg/dL — ABNORMAL HIGH (ref 70–99)
Potassium: 4 mmol/L (ref 3.5–5.1)
Sodium: 136 mmol/L (ref 135–145)
Total Bilirubin: 0.5 mg/dL (ref 0.3–1.2)
Total Protein: 7 g/dL (ref 6.5–8.1)

## 2022-01-09 MED ORDER — BORTEZOMIB CHEMO SQ INJECTION 3.5 MG (2.5MG/ML)
1.3000 mg/m2 | Freq: Once | INTRAMUSCULAR | Status: AC
  Administered 2022-01-09: 2.5 mg via SUBCUTANEOUS
  Filled 2022-01-09: qty 1

## 2022-01-09 MED ORDER — EPOETIN ALFA-EPBX 40000 UNIT/ML IJ SOLN
40000.0000 [IU] | INTRAMUSCULAR | Status: DC
  Administered 2022-01-09: 40000 [IU] via SUBCUTANEOUS
  Filled 2022-01-09: qty 1

## 2022-01-09 MED ORDER — DEXAMETHASONE 4 MG PO TABS
40.0000 mg | ORAL_TABLET | Freq: Once | ORAL | Status: AC
  Administered 2022-01-09: 40 mg via ORAL
  Filled 2022-01-09: qty 10

## 2022-01-09 NOTE — Progress Notes (Signed)
Ok to treat with Hgb 7.8 per Dr Janese Banks.  Velcade and Retacrit to be given today.

## 2022-01-09 NOTE — Patient Instructions (Addendum)
[facility]CER CTR AT [place]  Discharge Instructions: PLEASE RESTART REVLIMID 01/23/22.   Thank you for choosing [facility] to provide your oncology and hematology care.  If you have a lab appointment with the [facility], please go directly to the [facility] and check in at the registration area.  Wear comfortable clothing and clothing appropriate for easy access to any Portacath or PICC line.   We strive to give you quality time with your provider. You may need to reschedule your appointment if you arrive late (15 or more minutes).  Arriving late affects you and other patients whose appointments are after yours.  Also, if you miss three or more appointments without notifying the office, you may be dismissed from the clinic at the provider's discretion.      For prescription refill requests, have your pharmacy contact our office and allow 72 hours for refills to be completed.    Today you received the following chemotherapy and/or immunotherapy agents Velcade      To help prevent nausea and vomiting after your treatment, we encourage you to take your nausea medication as directed.  BELOW ARE SYMPTOMS THAT SHOULD BE REPORTED IMMEDIATELY: *FEVER GREATER THAN 100.4 F (38 C) OR HIGHER *CHILLS OR SWEATING *NAUSEA AND VOMITING THAT IS NOT CONTROLLED WITH YOUR NAUSEA MEDICATION *UNUSUAL SHORTNESS OF BREATH *UNUSUAL BRUISING OR BLEEDING *URINARY PROBLEMS (pain or burning when urinating, or frequent urination) *BOWEL PROBLEMS (unusual diarrhea, constipation, pain near the anus) TENDERNESS IN MOUTH AND THROAT WITH OR WITHOUT PRESENCE OF ULCERS (sore throat, sores in mouth, or a toothache) UNUSUAL RASH, SWELLING OR PAIN  UNUSUAL VAGINAL DISCHARGE OR ITCHING   Items with * indicate a potential emergency and should be followed up as soon as possible or go to the Emergency Department if any problems should occur.  Please show the CHEMOTHERAPY ALERT CARD or  IMMUNOTHERAPY ALERT CARD at check-in to the Emergency Department and triage nurse.  Should you have questions after your visit or need to cancel or reschedule your appointment, please contact [facility] [place] AT [place]  [phone] and follow the prompts.  Office hours are 8:00 a.m. to 4:30 p.m. Monday - Friday. Please note that voicemails left after 4:00 p.m. may not be returned until the following business day.  We are closed weekends and major holidays. You have access to a nurse at all times for urgent questions. Please call the main number to the clinic [phone] and follow the prompts.  For any non-urgent questions, you may also contact your provider using MyChart. We now offer e-Visits for anyone 32 and older to request care online for non-urgent symptoms. For details visit [email or web address].   Also download the MyChart app! Go to the app store, search "MyChart", open the app, select [place], and log in with your MyChart username and password.  Masks are optional in the cancer centers. If you would like for your care team to wear a mask while they are taking care of you, please let them know. For doctor visits, patients may have with them one support person who is at least 69 years old. At this time, visitors are not allowed in the infusion area.

## 2022-01-15 ENCOUNTER — Telehealth: Payer: Self-pay | Admitting: *Deleted

## 2022-01-15 ENCOUNTER — Other Ambulatory Visit: Payer: Self-pay

## 2022-01-15 ENCOUNTER — Other Ambulatory Visit: Payer: Medicare Other

## 2022-01-15 ENCOUNTER — Ambulatory Visit: Payer: Medicare Other | Admitting: Nurse Practitioner

## 2022-01-15 ENCOUNTER — Telehealth: Payer: Self-pay

## 2022-01-15 ENCOUNTER — Ambulatory Visit: Payer: Medicare Other

## 2022-01-15 DIAGNOSIS — C9000 Multiple myeloma not having achieved remission: Secondary | ICD-10-CM

## 2022-01-15 MED ORDER — LENALIDOMIDE 10 MG PO CAPS: 10.0000 mg | ORAL_CAPSULE | Freq: Every day | ORAL | 0 refills | Status: DC

## 2022-01-15 NOTE — Telephone Encounter (Signed)
Patient called stating that he needs his Revlimid refilled as he is to start taking it tomorrow

## 2022-01-15 NOTE — Telephone Encounter (Signed)
Reached out to pt to make him aware that REVLIMID rx has been sent for approval. However, pt is to not start taking medication until 01/23/22 per Dr. Janese Banks. Pt did not answer; reached out to Chrystie Nose and she stated she is currently driving and will return my call to provide another phone number where I may reach pt.

## 2022-01-16 ENCOUNTER — Ambulatory Visit: Payer: Medicare Other | Admitting: Nurse Practitioner

## 2022-01-16 ENCOUNTER — Other Ambulatory Visit: Payer: Medicare Other

## 2022-01-16 ENCOUNTER — Inpatient Hospital Stay: Payer: Medicare Other

## 2022-01-16 ENCOUNTER — Ambulatory Visit: Payer: Medicare Other

## 2022-01-16 ENCOUNTER — Encounter: Payer: Self-pay | Admitting: Oncology

## 2022-01-16 ENCOUNTER — Inpatient Hospital Stay (HOSPITAL_BASED_OUTPATIENT_CLINIC_OR_DEPARTMENT_OTHER): Payer: Medicare Other | Admitting: Nurse Practitioner

## 2022-01-16 ENCOUNTER — Other Ambulatory Visit: Payer: Self-pay | Admitting: Pharmacist

## 2022-01-16 VITALS — BP 140/78 | HR 78 | Temp 97.9°F | Resp 16 | Wt 166.0 lb

## 2022-01-16 DIAGNOSIS — Z5112 Encounter for antineoplastic immunotherapy: Secondary | ICD-10-CM | POA: Diagnosis not present

## 2022-01-16 DIAGNOSIS — Z5111 Encounter for antineoplastic chemotherapy: Secondary | ICD-10-CM | POA: Diagnosis not present

## 2022-01-16 DIAGNOSIS — C9000 Multiple myeloma not having achieved remission: Secondary | ICD-10-CM | POA: Diagnosis not present

## 2022-01-16 LAB — COMPREHENSIVE METABOLIC PANEL
ALT: 32 U/L (ref 0–44)
AST: 23 U/L (ref 15–41)
Albumin: 3.3 g/dL — ABNORMAL LOW (ref 3.5–5.0)
Alkaline Phosphatase: 59 U/L (ref 38–126)
Anion gap: 4 — ABNORMAL LOW (ref 5–15)
BUN: 22 mg/dL (ref 8–23)
CO2: 26 mmol/L (ref 22–32)
Calcium: 8.5 mg/dL — ABNORMAL LOW (ref 8.9–10.3)
Chloride: 109 mmol/L (ref 98–111)
Creatinine, Ser: 1.25 mg/dL — ABNORMAL HIGH (ref 0.61–1.24)
GFR, Estimated: >60 mL/min (ref >60)
Glucose, Bld: 55 mg/dL — ABNORMAL LOW (ref 70–99)
Potassium: 4 mmol/L (ref 3.5–5.1)
Sodium: 139 mmol/L (ref 135–145)
Total Bilirubin: 0.4 mg/dL (ref 0.3–1.2)
Total Protein: 7.5 g/dL (ref 6.5–8.1)

## 2022-01-16 LAB — CBC WITH DIFFERENTIAL/PLATELET
Abs Immature Granulocytes: 0.01 10*3/uL (ref 0.00–0.07)
Basophils Absolute: 0 10*3/uL (ref 0.0–0.1)
Basophils Relative: 1 %
Eosinophils Absolute: 0.1 10*3/uL (ref 0.0–0.5)
Eosinophils Relative: 3 %
HCT: 25.9 % — ABNORMAL LOW (ref 39.0–52.0)
Hemoglobin: 8.2 g/dL — ABNORMAL LOW (ref 13.0–17.0)
Immature Granulocytes: 0 %
Lymphocytes Relative: 25 %
Lymphs Abs: 1 10*3/uL (ref 0.7–4.0)
MCH: 31.9 pg (ref 26.0–34.0)
MCHC: 31.7 g/dL (ref 30.0–36.0)
MCV: 100.8 fL — ABNORMAL HIGH (ref 80.0–100.0)
Monocytes Absolute: 0.6 10*3/uL (ref 0.1–1.0)
Monocytes Relative: 16 %
Neutro Abs: 2.1 10*3/uL (ref 1.7–7.7)
Neutrophils Relative %: 55 %
Platelets: 321 10*3/uL (ref 150–400)
RBC: 2.57 MIL/uL — ABNORMAL LOW (ref 4.22–5.81)
RDW: 17.9 % — ABNORMAL HIGH (ref 11.5–15.5)
WBC: 3.8 10*3/uL — ABNORMAL LOW (ref 4.0–10.5)
nRBC: 0 % (ref 0.0–0.2)

## 2022-01-16 MED ORDER — DEXAMETHASONE 4 MG PO TABS
40.0000 mg | ORAL_TABLET | Freq: Once | ORAL | Status: AC
  Administered 2022-01-16: 40 mg via ORAL
  Filled 2022-01-16: qty 10

## 2022-01-16 MED ORDER — BORTEZOMIB CHEMO SQ INJECTION 3.5 MG (2.5MG/ML)
1.3000 mg/m2 | Freq: Once | INTRAMUSCULAR | Status: AC
  Administered 2022-01-16: 2.5 mg via SUBCUTANEOUS
  Filled 2022-01-16: qty 1

## 2022-01-16 MED ORDER — LENALIDOMIDE 10 MG PO CAPS: 10.0000 mg | ORAL_CAPSULE | Freq: Every day | ORAL | 0 refills | Status: DC

## 2022-01-16 NOTE — Patient Instructions (Signed)
[facility] [place]  Discharge Instructions: Thank you for choosing [facility] to provide your oncology and hematology care.  If you have a lab appointment with the Cancer Center, please go directly to the Cancer Center and check in at the registration area.  Wear comfortable clothing and clothing appropriate for easy access to any Portacath or PICC line.   We strive to give you quality time with your provider. You may need to reschedule your appointment if you arrive late (15 or more minutes).  Arriving late affects you and other patients whose appointments are after yours.  Also, if you miss three or more appointments without notifying the office, you may be dismissed from the clinic at the provider's discretion.      For prescription refill requests, have your pharmacy contact our office and allow 72 hours for refills to be completed.    Today you received the following chemotherapy and/or immunotherapy agents VELCADE      To help prevent nausea and vomiting after your treatment, we encourage you to take your nausea medication as directed.  BELOW ARE SYMPTOMS THAT SHOULD BE REPORTED IMMEDIATELY: *FEVER GREATER THAN 100.4 F (38 C) OR HIGHER *CHILLS OR SWEATING *NAUSEA AND VOMITING THAT IS NOT CONTROLLED WITH YOUR NAUSEA MEDICATION *UNUSUAL SHORTNESS OF BREATH *UNUSUAL BRUISING OR BLEEDING *URINARY PROBLEMS (pain or burning when urinating, or frequent urination) *BOWEL PROBLEMS (unusual diarrhea, constipation, pain near the anus) TENDERNESS IN MOUTH AND THROAT WITH OR WITHOUT PRESENCE OF ULCERS (sore throat, sores in mouth, or a toothache) UNUSUAL RASH, SWELLING OR PAIN  UNUSUAL VAGINAL DISCHARGE OR ITCHING   Items with * indicate a potential emergency and should be followed up as soon as possible or go to the Emergency Department if any problems should occur.  Please show the CHEMOTHERAPY ALERT CARD or IMMUNOTHERAPY ALERT CARD at check-in to the  Emergency Department and triage nurse.  Should you have questions after your visit or need to cancel or reschedule your appointment, please contact [facility]  [phone] and follow the prompts.  Office hours are 8:00 a.m. to 4:30 p.m. Monday - Friday. Please note that voicemails left after 4:00 p.m. may not be returned until the following business day.  We are closed weekends and major holidays. You have access to a nurse at all times for urgent questions. Please call the main number to the clinic [phone] and follow the prompts.  For any non-urgent questions, you may also contact your provider using MyChart. We now offer e-Visits for anyone 18 and older to request care online for non-urgent symptoms. For details visit [email or web address].   Also download the MyChart app! Go to the app store, search "MyChart", open the app, select [place], and log in with your MyChart username and password.  Masks are optional in the cancer centers. If you would like for your care team to wear a mask while they are taking care of you, please let them know. For doctor visits, patients may have with them one support person who is at least 69 years old. At this time, visitors are not allowed in the infusion area.  Bortezomib Injection What is this medication? BORTEZOMIB (bor TEZ oh mib) treats lymphoma. It may also be used to treat multiple myeloma, a type of bone marrow cancer. It works by blocking a protein that causes cancer cells to grow and multiply. This helps to slow or stop the spread of cancer cells. This medicine may   be used for other purposes; ask your health care provider or pharmacist if you have questions. COMMON BRAND NAME(S): Velcade What should I tell my care team before I take this medication? They need to know if you have any of these conditions: Dehydration Diabetes Heart disease Liver disease Tingling of the fingers or toes or other nerve  disorder An unusual or allergic reaction to bortezomib, other medications, foods, dyes, or preservatives If you or your partner are pregnant or trying to get pregnant Breastfeeding How should I use this medication? This medication is injected into a vein or under the skin. It is given by your care team in a hospital or clinic setting. Talk to your care team about the use of this medication in children. Special care may be needed. Overdosage: If you think you have taken too much of this medicine contact a poison control center or emergency room at once. NOTE: This medicine is only for you. Do not share this medicine with others. What if I miss a dose? Keep appointments for follow-up doses. It is important not to miss your dose. Call your care team if you are unable to keep an appointment. What may interact with this medication? Ketoconazole Rifampin This list may not describe all possible interactions. Give your health care provider a list of all the medicines, herbs, non-prescription drugs, or dietary supplements you use. Also tell them if you smoke, drink alcohol, or use illegal drugs. Some items may interact with your medicine. What should I watch for while using this medication? Your condition will be monitored carefully while you are receiving this medication. You may need blood work while taking this medication. This medication may affect your coordination, reaction time, or judgment. Do not drive or operate machinery until you know how this medication affects you. Sit up or stand slowly to reduce the risk of dizzy or fainting spells. Drinking alcohol with this medication can increase the risk of these side effects. This medication may increase your risk of getting an infection. Call your care team for advice if you get a fever, chills, sore throat, or other symptoms of a cold or flu. Do not treat yourself. Try to avoid being around people who are sick. Check with your care team if you have  severe diarrhea, nausea, and vomiting, or if you sweat a lot. The loss of too much body fluid may make it dangerous for you to take this medication. Talk to your care team if you may be pregnant. Serious birth defects can occur if you take this medication during pregnancy and for 7 months after the last dose. You will need a negative pregnancy test before starting this medication. Contraception is recommended while taking this medication and for 7 months after the last dose. Your care team can help you find the option that works for you. If your partner can get pregnant, use a condom during sex while taking this medication and for 4 months after the last dose. Do not breastfeed while taking this medication and for 2 months after the last dose. This medication may cause infertility. Talk to your care team if you are concerned about your fertility. What side effects may I notice from receiving this medication? Side effects that you should report to your care team as soon as possible: Allergic reactions--skin rash, itching, hives, swelling of the face, lips, tongue, or throat Bleeding--bloody or black, tar-like stools, vomiting blood or brown material that looks like coffee grounds, red or dark brown urine, small   red or purple spots on skin, unusual bruising or bleeding Bleeding in the brain--severe headache, stiff neck, confusion, dizziness, change in vision, numbness or weakness of the face, arm, or leg, trouble speaking, trouble walking, vomiting Bowel blockage--stomach cramping, unable to have a bowel movement or pass gas, loss of appetite, vomiting Heart failure--shortness of breath, swelling of the ankles, feet, or hands, sudden weight gain, unusual weakness or fatigue Infection--fever, chills, cough, sore throat, wounds that don't heal, pain or trouble when passing urine, general feeling of discomfort or being unwell Liver injury--right upper belly pain, loss of appetite, nausea, light-colored  stool, dark yellow or brown urine, yellowing skin or eyes, unusual weakness or fatigue Low blood pressure--dizziness, feeling faint or lightheaded, blurry vision Lung injury--shortness of breath or trouble breathing, cough, spitting up blood, chest pain, fever Pain, tingling, or numbness in the hands or feet Severe or prolonged diarrhea Stomach pain, bloody diarrhea, pale skin, unusual weakness or fatigue, decrease in the amount of urine, which may be signs of hemolytic uremic syndrome Sudden and severe headache, confusion, change in vision, seizures, which may be signs of posterior reversible encephalopathy syndrome (PRES) TTP--purple spots on the skin or inside the mouth, pale skin, yellowing skin or eyes, unusual weakness or fatigue, fever, fast or irregular heartbeat, confusion, change in vision, trouble speaking, trouble walking Tumor lysis syndrome (TLS)--nausea, vomiting, diarrhea, decrease in the amount of urine, dark urine, unusual weakness or fatigue, confusion, muscle pain or cramps, fast or irregular heartbeat, joint pain Side effects that usually do not require medical attention (report to your care team if they continue or are bothersome): Constipation Diarrhea Fatigue Loss of appetite Nausea This list may not describe all possible side effects. Call your doctor for medical advice about side effects. You may report side effects to FDA at 1-800-FDA-1088. Where should I keep my medication? This medication is given in a hospital or clinic. It will not be stored at home. NOTE: This sheet is a summary. It may not cover all possible information. If you have questions about this medicine, talk to your doctor, pharmacist, or health care provider.  2023 Elsevier/Gold Standard (2021-08-13 00:00:00)   

## 2022-01-16 NOTE — Progress Notes (Signed)
Pt returns for follow-up and valcade today. He reports bilateral lower extremity edema. He states that it is usually on one leg that swells, but today both appear swollen.

## 2022-01-16 NOTE — Progress Notes (Signed)
Hematology/Oncology Consult Note [facility]  Telephone:([phone] [phone]  Patient Care Team: Dion Body, MD as PCP - General (Family Medicine) [place], [place] (Optometry) Sindy Guadeloupe, MD as Consulting Physician (Oncology)   Name of the patient: Nicholas Weiss  [id number]  04-Sep-1950   Date of visit: 01/16/22  Diagnosis- high risk IgG lambda multiple myeloma R-ISS stage III  Chief complaint/ Reason for visit-on treatment assessment prior to cycle 4-day 15 of Velcade  Heme/Onc history: Patient is a 71 year old African-American male with a past medical history significant for uncontrolled type 1 diabetes, chronic kidney disease stage III chronic normocytic anemia referred for abnormal SPEP. Patient's creatinine has been fluctuating between 1.5-2 but about 5 months ago it went up all the way to 5 and his blood sugars were in the 1000 range. More recently his kidney numbers have been drifting back to normal values. As a part of the work-up he had serum protein electrophoresis done which showed an elevated gammaglobulin fraction with an M spike of 3%. The amount of M spike was not quantified in the specimen. He has therefore been referred to [place] for further management. Patient endorses chronic fatigue reports that his appetite and weight have remained stable   Results of blood work from 04/08/2020 were as follows: CBC showed white count of 2.9, H&H of 10.5/31.2 with an MCV of 91 and a platelet count of 191.  CMP showed a mildly elevated creatinine of 1.2 which was better as compared to 5 months ago when it was 3.9.  Total protein was mildly elevated at 9.1 calcium normal at 8.4 ferritin and iron studies B12 folate TSH and haptoglobin were normal.  Myeloma panel revealed an elevated IgG level of 06/04/2004 with an M protein of 3.1 g.  Immunofixation showed IgG lambda specificity.  Serum free light chain ratio was elevated at 25 and free light chain  lambda elevated at 370   Further myeloma work-up including a PET CT scan did not reveal any evidence of edematous lesions.  Bone marrow biopsy showed 23% plasma cells by manual count and 30% by CD138 IHC.  Normal cytogenetics.  FISH studies for myeloma showed gain of 1 q.  13 q-. detected.  P53 not detected.   He was treated for a year and has having smoldering multiple myeloma since both CKD and anemia have been stable for 3 to 4 years and could be secondary to uncontrolled diabetes.In June 2023 IgG levels increased to 5463 with an M protein of 4.1 g as compared to 2.6 g in September 2022.  Serum free light chain ratio remains around 25.  Therefore a repeat bone marrow biopsy was done which shows further increase in plasma cells ranging from 30 to 70% and by immunohistochemistry 60 to 70% of the cells were positive for CD138.  Cytogenetics normal.  FISH study showed 4: 14 translocation and gain of 1 q. making this high risk RISS stage III.  Repeat PET scan showed no lytic lesions    Interval history- Patient is 71 year old male who returns to clinic for consideration of D15C4 of velcade. No interval hospitalizations. He's feeling well. Denies complaints. He ran out of revlimid last week. Has been 7 days since last dose.   ECOG PS- 1 Pain scale- 0  Review of systems- Review of Systems  Constitutional:  Negative for chills, fever, malaise/fatigue and weight loss.  HENT:  Negative for congestion, ear discharge and nosebleeds.   Eyes:  Negative  for blurred vision.  Respiratory:  Negative for cough, hemoptysis, sputum production, shortness of breath and wheezing.   Cardiovascular:  Negative for chest pain, palpitations, orthopnea and claudication.  Gastrointestinal:  Negative for abdominal pain, blood in stool, constipation, diarrhea, heartburn, melena, nausea and vomiting.  Genitourinary:  Negative for dysuria, flank pain, frequency, hematuria and urgency.  Musculoskeletal:  Negative for back pain,  falls, joint pain and myalgias.  Skin:  Negative for rash.  Neurological:  Negative for dizziness, tingling, focal weakness, seizures, weakness and headaches.  Endo/Heme/Allergies:  Does not bruise/bleed easily.  Psychiatric/Behavioral:  Positive for substance abuse (cocaine). Negative for depression and suicidal ideas. The patient is not nervous/anxious and does not have insomnia.       Allergies  Allergen Reactions   Penicillins Other (See Comments)    Has patient had a PCN reaction causing immediate rash, facial/tongue/throat swelling, SOB or lightheadedness with hypotension: No Has patient had a PCN reaction causing severe rash involving mucus membranes or skin necrosis: No Has patient had a PCN reaction that required hospitalization No Has patient had a PCN reaction occurring within the last 10 years: No If all of the above answers are "NO", then may proceed with Cephalosporin use.  Other reaction(s): Other (see comments) Other reaction(s): Other (See Comments) Has patient had a PCN reaction causing immediate rash, facial/tongue/throat swelling, SOB or lightheadedness with hypotension: No Has patient had a PCN reaction causing severe rash involving mucus membranes or skin necrosis: No Has patient had a PCN reaction that required hospitalization No Has patient had a PCN reaction occurring within the last 10 years: No If all of the above answers are "NO", then may proceed with Cephalosporin use. Has patient had a PCN reaction causing immediate rash, facial/tongue/throat swelling, SOB or lightheadedness with hypotension: No Has patient had a PCN reaction causing severe rash involving mucus membranes or skin necrosis: No Has patient had a PCN reaction that required hospitalization No Has patient had a PCN reaction occurring within the last 10 years: No If all of the above answers are "NO", then may proceed with Cephalosporin use.     Past Medical History:  Diagnosis Date   DKA  (diabetic ketoacidoses) 04/06/2016   Hypercholesteremia    Hypertension      Past Surgical History:  Procedure Laterality Date   COLONOSCOPY WITH PROPOFOL N/A 02/01/2020   Procedure: COLONOSCOPY WITH PROPOFOL;  Surgeon: Lin Landsman, MD;  Location: [facility] ENDOSCOPY;  Service: Gastroenterology;  Laterality: N/A;   ESOPHAGOGASTRODUODENOSCOPY  02/01/2020   Procedure: ESOPHAGOGASTRODUODENOSCOPY (EGD);  Surgeon: Lin Landsman, MD;  Location: [facility] ENDOSCOPY;  Service: Gastroenterology;;   KNEE SURGERY Right    Torn meniscus   KNEE SURGERY Left     Social History   Socioeconomic History   Marital status: Single    Spouse name: Not on file   Number of children: 3   Years of education: Not on file   Highest education level: High school graduate  Occupational History    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  Tobacco Use   Smoking status: Never   Smokeless tobacco: Never  Vaping Use   Vaping Use: Never used  Substance and Sexual Activity   Alcohol use: Not Currently    Alcohol/week: 0.0 - 1.0 standard drinks of alcohol    Comment: "once every 2 months"   Drug use: Yes    Types: Marijuana, "Crack" cocaine    Comment: last week   Sexual activity: Yes    Birth control/protection:  None  Other Topics Concern   Not on file  Social History Narrative   Lives with girlfriend "sharon"   Social Determinants of Health   Financial Resource Strain: Low Risk  (01/01/2020)   Overall Financial Resource Strain (CARDIA)    Difficulty of Paying Living Expenses: Not hard at all  Food Insecurity: No Food Insecurity (12/07/2021)   Hunger Vital Sign    Worried About Running Out of Food in the Last Year: Never true    Ran Out of Food in the Last Year: Never true  Transportation Needs: No Transportation Needs (12/07/2021)   PRAPARE - Hydrologist (Medical): No    Lack of Transportation (Non-Medical): No  Physical Activity: Insufficiently Active (01/01/2020)   Exercise  Vital Sign    Days of Exercise per Week: 7 days    Minutes of Exercise per Session: 20 min  Stress: No Stress Concern Present (01/01/2020)   [place]    Feeling of Stress : Not at all  Social Connections: Moderately Isolated (01/01/2020)   Social Connection and Isolation Panel [NHANES]    Frequency of Communication with Friends and Family: More than three times a week    Frequency of Social Gatherings with Friends and Family: More than three times a week    Attends Religious Services: Never    Marine scientist or Organizations: No    Attends Archivist Meetings: Never    Marital Status: Living with partner  Intimate Partner Violence: Not At Risk (12/07/2021)   Humiliation, Afraid, Rape, and Kick questionnaire    Fear of Current or Ex-Partner: No    Emotionally Abused: No    Physically Abused: No    Sexually Abused: No    Family History  Problem Relation Age of Onset   Heart attack Father    Hypertension Sister    Cancer Sister      Current Outpatient Medications:    acyclovir (ZOVIRAX) 400 MG tablet, Take 1 tablet (400 mg total) by mouth 2 (two) times daily., Disp: 60 tablet, Rfl: 3   Alcohol Swabs (B-D SINGLE USE SWABS REGULAR) PADS, Use to check blood sugar four times daily for type 1 diabetes, Disp: 300 each, Rfl: 4   amLODipine (NORVASC) 10 MG tablet, Take 1 tablet (10 mg total) by mouth daily., Disp: 30 tablet, Rfl: 2   ASPIRIN 81 PO, Take 81 mg by mouth daily., Disp: , Rfl:    atorvastatin (LIPITOR) 80 MG tablet, Take 1 tablet (80 mg total) by mouth at bedtime., Disp: 30 tablet, Rfl: 2   baclofen 5 MG TABS, Take 5 mg by mouth 3 (three) times daily as needed for muscle spasms., Disp: 60 tablet, Rfl: 1   Blood Glucose Calibration (TRUE METRIX LEVEL 1) Low SOLN, Use to check blood sugar four times daily for type 1 diabetes, Disp: 1 each, Rfl: 2   clopidogrel (PLAVIX) 75 MG tablet, Take by mouth.  (Patient not taking: Reported on 12/12/2021), Disp: , Rfl:    dicyclomine (BENTYL) 20 MG tablet, Take 1 tablet (20 mg total) by mouth 3 (three) times daily with meals as needed for spasms., Disp: 90 tablet, Rfl: 0   FARXIGA 10 MG TABS tablet, Take 10 mg by mouth daily., Disp: , Rfl:    gabapentin (NEURONTIN) 300 MG capsule, Take 300 mg by mouth in the morning and 600 mg at night., Disp: , Rfl:    HYDROcodone-acetaminophen (NORCO/VICODIN) 5-325  MG tablet, Take by mouth. (Patient not taking: Reported on 12/12/2021), Disp: , Rfl:    insulin degludec (TRESIBA FLEXTOUCH) 100 UNIT/ML FlexTouch Pen, Inject 20 Units into the skin daily. Home dose., Disp: , Rfl:    Insulin Syringe-Needle U-100 (INSULIN SYRINGE 1CC/31GX5/16") 31G X 5/16" 1 ML MISC, For insulin injections up to 4 times daily, Disp: 270 each, Rfl: 4   lenalidomide (REVLIMID) 10 MG capsule, Take 1 capsule (10 mg total) by mouth daily., Disp: 14 capsule, Rfl: 0   lisinopril (ZESTRIL) 10 MG tablet, Take 1 tablet (10 mg total) by mouth daily., Disp: 30 tablet, Rfl: 2   NOVOLOG 100 UNIT/ML injection, INJECT UP TO 14 UNITS THREE TIMES DAILY BEFORE MEALS ACCORDING TO SLIDING SCALE, Disp: 10 mL, Rfl: 1  Physical exam:  Vitals:   01/16/22 0908  BP: (!) 140/78  Pulse: 78  Resp: 16  Temp: 97.9 F (36.6 C)  TempSrc: Oral  SpO2: 100%  Weight: 166 lb (75.3 kg)   Physical Exam Constitutional:      General: He is not in acute distress. Cardiovascular:     Rate and Rhythm: Normal rate and regular rhythm.     Heart sounds: Normal heart sounds.  Pulmonary:     Effort: Pulmonary effort is normal.     Breath sounds: Normal breath sounds.  Abdominal:     General: Bowel sounds are normal.     Palpations: Abdomen is soft.  Skin:    General: Skin is warm and dry.  Neurological:     Mental Status: He is alert and oriented to person, place, and time.         Latest Ref Rng & Units 01/16/2022    8:47 AM  CMP  Glucose 70 - 99 mg/dL 55   BUN 8 -  23 mg/dL 22   Creatinine 0.61 - 1.24 mg/dL 1.25   Sodium 135 - 145 mmol/L 139   Potassium 3.5 - 5.1 mmol/L 4.0   Chloride 98 - 111 mmol/L 109   CO2 22 - 32 mmol/L 26   Calcium 8.9 - 10.3 mg/dL 8.5   Total Protein 6.5 - 8.1 g/dL 7.5   Total Bilirubin 0.3 - 1.2 mg/dL 0.4   Alkaline Phos 38 - 126 U/L 59   AST 15 - 41 U/L 23   ALT 0 - 44 U/L 32       Latest Ref Rng & Units 01/16/2022    8:47 AM  CBC  WBC 4.0 - 10.5 K/uL 3.8   Hemoglobin 13.0 - 17.0 g/dL 8.2   Hematocrit 39.0 - 52.0 % 25.9   Platelets 150 - 400 K/uL 321    No images are attached to the encounter.  No results found.   Assessment and plan- Patient is a 71 y.o. male with R-ISS stage III high risk IgG lambda multiple myeloma, not transplant candidate, currently on VRd chemotherapy.  He is here for treatment assessment prior to cycle 4-day 15 of Velcade.   He will continue Revlimid 10 mg 2 weeks on, 1 week off. He has run out of revlimid last week. Spoke to [facility], rph who will order revlimid today and have sent. Advised him to restart revlimid upon receipt. He will continue 2 weeks on, 1 week off. Alyson will do virtual visit with him next week to confirm receipt and help assist in medication management.   Counts reviewed and acceptable for treatment today. Proceed with velcade today.   He will proceed directly to cycle 5,  day 1 without MD visit. He will see Dr Janese Banks back in 2 weeks for cycle 5-Day 8 of velcade.   Continue acyclovir prophylaxis along with aspirin daily.   Anemia- Baseline hemoglobin 9-10. More recently 7-8. Today, 8.2. Consider retacrit at next visit.   CKD- gfr > 60 today. Cr 1.25. Monitor.   Bisphosphonates- no active lesions from MM. Bisphosphonates on hold.   Cocaine abuse- encouraged avoidance.   Leg swelling- likely related to medication and anemia. Encouraged compression stockings.   Disposition:  D15-C4 velcade today 1 week- labs (cbc, cmp), D1C5 of velcade, +/- retacrit 2 weeks- labs  (cbc, cmp), Dr Janese Banks, [facility] of velcade Virtual visit with alyson on 10/24- la   Visit Diagnosis 1. Multiple myeloma not having achieved remission ([place])    Beckey Rutter, [place], AGNP-C [place] at [facility] [phone] (clinic) 01/16/2022

## 2022-01-19 ENCOUNTER — Inpatient Hospital Stay (HOSPITAL_BASED_OUTPATIENT_CLINIC_OR_DEPARTMENT_OTHER): Payer: Medicare Other | Admitting: Pharmacist

## 2022-01-19 DIAGNOSIS — C9000 Multiple myeloma not having achieved remission: Secondary | ICD-10-CM

## 2022-01-19 NOTE — Progress Notes (Signed)
[facility] [place]  [phone] [phone]    I connected with Mr. Sharman on 01/19/22 at 10:00 AM EDT by telephone and verified that I am speaking with the correct person using two identifiers.  Name of the patient: Nicholas Weiss  [id number]  [birth year]   Location: Patient: away from home, on cell Provider: in office   I discussed the limitations, risks, security and privacy concerns of performing an evaluation and management service by telephone and the availability of in person appointments. I also discussed with the patient that there may be a patient responsible charge related to this service. The patient expressed understanding and agreed to proceed.  HPI: Patient is a 71 y.o. male with IgG lambda multiple myeloma (previously smoldering myeloma). Patient is currently begin treated with lenalidomide, bortezomib and dexamethasone. Treatment started on 10/03/21.  Reason for Consult: Oral chemotherapy follow-up for lenalidomide therapy.   PAST MEDICAL HISTORY: Past Medical History:  Diagnosis Date   DKA (diabetic ketoacidoses) 04/06/2016   Hypercholesteremia    Hypertension     HEMATOLOGY/ONCOLOGY HISTORY:  Oncology History  Multiple myeloma ([place])  09/29/2021 Initial Diagnosis   Multiple myeloma ([place])   09/29/2021 Cancer Staging   Staging form: Plasma Cell Myeloma and Plasma Cell Disorders, AJCC 8th Edition - Clinical stage from 09/29/2021: No stage assigned - Signed by Sindy Guadeloupe, MD on 09/29/2021 Stage prefix: Initial diagnosis   10/17/2021 Cancer Staging   Staging form: Plasma Cell Myeloma and Plasma Cell Disorders, AJCC 8th Edition - Clinical stage from 10/17/2021: RISS Stage III (Beta-2-microglobulin (mg/L): 5.8, Albumin (g/dL): 3.3, ISS: Stage III, High-risk cytogenetics: Present, LDH: Normal) - Signed by Sindy Guadeloupe, MD on 10/17/2021 Stage prefix: Initial diagnosis Beta 2 microglobulin range (mg/L):  Greater than or equal to 5.5 Albumin range (g/dL): Less than 3.5 Cytogenetics: 1q addition, t(4;14) translocation Serum calcium level: Normal Serum creatinine level: Elevated   Multiple myeloma not having achieved remission ([place])  09/29/2021 Initial Diagnosis   Multiple myeloma not having achieved remission ([place])   10/03/2021 - 11/14/2021 Chemotherapy   Patient is on Treatment Plan : MYELOMA NON-TRANSPLANT CANDIDATES VRd weekly q21d     11/21/2021 -  Chemotherapy   Patient is on Treatment Plan : MYELOMA NON-TRANSPLANT CANDIDATES VRd weekly q21d       ALLERGIES:  is allergic to penicillins.  MEDICATIONS:  Current Outpatient Medications  Medication Sig Dispense Refill   acyclovir (ZOVIRAX) 400 MG tablet Take 1 tablet (400 mg total) by mouth 2 (two) times daily. 60 tablet 3   Alcohol Swabs (B-D SINGLE USE SWABS REGULAR) PADS Use to check blood sugar four times daily for type 1 diabetes 300 each 4   amLODipine (NORVASC) 10 MG tablet Take 1 tablet (10 mg total) by mouth daily. 30 tablet 2   ASPIRIN 81 PO Take 81 mg by mouth daily.     atorvastatin (LIPITOR) 80 MG tablet Take 1 tablet (80 mg total) by mouth at bedtime. 30 tablet 2   baclofen 5 MG TABS Take 5 mg by mouth 3 (three) times daily as needed for muscle spasms. 60 tablet 1   Blood Glucose Calibration (TRUE METRIX LEVEL 1) Low SOLN Use to check blood sugar four times daily for type 1 diabetes 1 each 2   clopidogrel (PLAVIX) 75 MG tablet Take by mouth. (Patient not taking: Reported on 12/12/2021)     dicyclomine (BENTYL) 20 MG tablet Take 1 tablet (20 mg total) by mouth 3 (three)  times daily with meals as needed for spasms. 90 tablet 0   FARXIGA 10 MG TABS tablet Take 10 mg by mouth daily.     gabapentin (NEURONTIN) 300 MG capsule Take 300 mg by mouth in the morning and 600 mg at night.     HYDROcodone-acetaminophen (NORCO/VICODIN) 5-325 MG tablet Take by mouth. (Patient not taking: Reported on 12/12/2021)     insulin degludec (TRESIBA  FLEXTOUCH) 100 UNIT/ML FlexTouch Pen Inject 20 Units into the skin daily. Home dose.     Insulin Syringe-Needle U-100 (INSULIN SYRINGE 1CC/31GX5/16") 31G X 5/16" 1 ML MISC For insulin injections up to 4 times daily 270 each 4   lenalidomide (REVLIMID) 10 MG capsule Take 1 capsule (10 mg total) by mouth daily. Take for 14 days, then hold for 7 days. Repeat every 21 days. 14 capsule 0   lisinopril (ZESTRIL) 10 MG tablet Take 1 tablet (10 mg total) by mouth daily. 30 tablet 2   NOVOLOG 100 UNIT/ML injection INJECT UP TO 14 UNITS THREE TIMES DAILY BEFORE MEALS ACCORDING TO SLIDING SCALE 10 mL 1   No current facility-administered medications for this visit.    VITAL SIGNS: There were no vitals taken for this visit. There were no vitals filed for this visit.  Estimated body mass index is 23.48 kg/m as calculated from the following:   Height as of 12/07/21: 5' 10.5" (1.791 m).   Weight as of 01/16/22: 75.3 kg (166 lb).  LABS: CBC:    Component Value Date/Time   WBC 3.8 (L) 01/16/2022 0847   HGB 8.2 (L) 01/16/2022 0847   HGB 11.2 (L) 10/03/2020 1317   HCT 25.9 (L) 01/16/2022 0847   HCT 33.1 (L) 10/03/2020 1317   PLT 321 01/16/2022 0847   PLT 202 10/03/2020 1317   MCV 100.8 (H) 01/16/2022 0847   MCV 89 10/03/2020 1317   MCV 91 10/11/2012 0542   NEUTROABS 2.1 01/16/2022 0847   NEUTROABS 11.6 (H) 10/11/2012 0542   LYMPHSABS 1.0 01/16/2022 0847   LYMPHSABS 0.7 (L) 10/11/2012 0542   MONOABS 0.6 01/16/2022 0847   MONOABS 0.3 10/11/2012 0542   EOSABS 0.1 01/16/2022 0847   EOSABS 0.0 10/11/2012 0542   BASOSABS 0.0 01/16/2022 0847   BASOSABS 0.0 10/11/2012 0542   Comprehensive Metabolic Panel:    Component Value Date/Time   NA 139 01/16/2022 0847   NA 135 10/03/2020 1317   NA 137 10/11/2012 0542   K 4.0 01/16/2022 0847   K 4.2 10/11/2012 0542   CL 109 01/16/2022 0847   CL 105 10/11/2012 0542   CO2 26 01/16/2022 0847   CO2 26 10/11/2012 0542   BUN 22 01/16/2022 0847   BUN 24  10/03/2020 1317   BUN 17 10/11/2012 0542   CREATININE 1.25 (H) 01/16/2022 0847   CREATININE 1.36 (H) 10/11/2012 0542   GLUCOSE 55 (L) 01/16/2022 0847   GLUCOSE 239 (H) 10/11/2012 0542   CALCIUM 8.5 (L) 01/16/2022 0847   CALCIUM 8.7 10/11/2012 0542   AST 23 01/16/2022 0847   ALT 32 01/16/2022 0847   ALKPHOS 59 01/16/2022 0847   BILITOT 0.4 01/16/2022 0847   BILITOT 0.4 10/03/2020 1317   PROT 7.5 01/16/2022 0847   PROT 9.1 (H) 10/03/2020 1317   ALBUMIN 3.3 (L) 01/16/2022 0847   ALBUMIN 3.3 (L) 10/03/2020 1317    On phone during today's visit: patient only  Assessment and Plan-  CPP asked to call patient due to lenalidomide adherence concerns Called patient and he has not  yet called [place] to set-up medication delivery, provided him with the phoen number and ask him to cal them today then call me back to let me know when it will be delivered Patient has not called me back, but I can see in the [place] portal a dispense date of 01/19/22 Attempted to reach patient on 10/24 to discuss how to handle the delay in the start of the lenalidomide (he was suppose to have started on 01/16/22) No answer and unable to LVM Mailed patient medication calendar and one give to [place], [place] to give to him when in clinic on Friday 10/27    Thank you for allowing me to participate in the care of this very pleasant patient.   Time Total: 15 mins of non face-to-face telephone visit time during this encounter  Visit consisted of counseling and education on dealing with issues of symptom management in the setting of serious and potentially life-threatening illness.Greater than 50%  of this time was spent counseling and coordinating care related to the above assessment and plan.   Darl Pikes, PharmD, BCPS, BCOP, CPP Hematology/Oncology Clinical Pharmacist Practitioner [place]/DB/AP [facility] [phone]  01/19/2022 11:40 AM

## 2022-01-22 ENCOUNTER — Other Ambulatory Visit: Payer: Medicare Other

## 2022-01-22 ENCOUNTER — Ambulatory Visit: Payer: Medicare Other

## 2022-01-23 ENCOUNTER — Encounter: Payer: Self-pay | Admitting: Oncology

## 2022-01-23 ENCOUNTER — Inpatient Hospital Stay: Payer: Medicare Other

## 2022-01-23 ENCOUNTER — Ambulatory Visit: Payer: Medicare Other | Admitting: Oncology

## 2022-01-23 ENCOUNTER — Encounter: Payer: Self-pay | Admitting: Nurse Practitioner

## 2022-01-23 VITALS — BP 148/79 | HR 85 | Temp 97.8°F | Wt 161.5 lb

## 2022-01-23 DIAGNOSIS — C9000 Multiple myeloma not having achieved remission: Secondary | ICD-10-CM

## 2022-01-23 DIAGNOSIS — Z5112 Encounter for antineoplastic immunotherapy: Secondary | ICD-10-CM | POA: Diagnosis not present

## 2022-01-23 DIAGNOSIS — N183 Chronic kidney disease, stage 3 unspecified: Secondary | ICD-10-CM

## 2022-01-23 LAB — COMPREHENSIVE METABOLIC PANEL
ALT: 31 U/L (ref 0–44)
AST: 28 U/L (ref 15–41)
Albumin: 3.3 g/dL — ABNORMAL LOW (ref 3.5–5.0)
Alkaline Phosphatase: 59 U/L (ref 38–126)
Anion gap: 7 (ref 5–15)
BUN: 22 mg/dL (ref 8–23)
CO2: 25 mmol/L (ref 22–32)
Calcium: 8.2 mg/dL — ABNORMAL LOW (ref 8.9–10.3)
Chloride: 106 mmol/L (ref 98–111)
Creatinine, Ser: 1.86 mg/dL — ABNORMAL HIGH (ref 0.61–1.24)
GFR, Estimated: 39 mL/min — ABNORMAL LOW (ref >60)
Glucose, Bld: 197 mg/dL — ABNORMAL HIGH (ref 70–99)
Potassium: 4.5 mmol/L (ref 3.5–5.1)
Sodium: 138 mmol/L (ref 135–145)
Total Bilirubin: 0.3 mg/dL (ref 0.3–1.2)
Total Protein: 7.5 g/dL (ref 6.5–8.1)

## 2022-01-23 LAB — CBC WITH DIFFERENTIAL/PLATELET
Abs Immature Granulocytes: 0 10*3/uL (ref 0.00–0.07)
Basophils Absolute: 0 10*3/uL (ref 0.0–0.1)
Basophils Relative: 1 %
Eosinophils Absolute: 0.1 10*3/uL (ref 0.0–0.5)
Eosinophils Relative: 4 %
HCT: 27.4 % — ABNORMAL LOW (ref 39.0–52.0)
Hemoglobin: 8.6 g/dL — ABNORMAL LOW (ref 13.0–17.0)
Immature Granulocytes: 0 %
Lymphocytes Relative: 27 %
Lymphs Abs: 0.9 10*3/uL (ref 0.7–4.0)
MCH: 31.4 pg (ref 26.0–34.0)
MCHC: 31.4 g/dL (ref 30.0–36.0)
MCV: 100 fL (ref 80.0–100.0)
Monocytes Absolute: 0.4 10*3/uL (ref 0.1–1.0)
Monocytes Relative: 13 %
Neutro Abs: 1.8 10*3/uL (ref 1.7–7.7)
Neutrophils Relative %: 55 %
Platelets: 318 10*3/uL (ref 150–400)
RBC: 2.74 MIL/uL — ABNORMAL LOW (ref 4.22–5.81)
RDW: 17.4 % — ABNORMAL HIGH (ref 11.5–15.5)
WBC: 3.3 10*3/uL — ABNORMAL LOW (ref 4.0–10.5)
nRBC: 0 % (ref 0.0–0.2)

## 2022-01-23 MED ORDER — DEXAMETHASONE 4 MG PO TABS
40.0000 mg | ORAL_TABLET | Freq: Once | ORAL | Status: AC
  Administered 2022-01-23: 40 mg via ORAL
  Filled 2022-01-23: qty 10

## 2022-01-23 MED ORDER — BORTEZOMIB CHEMO SQ INJECTION 3.5 MG (2.5MG/ML)
1.3000 mg/m2 | Freq: Once | INTRAMUSCULAR | Status: AC
  Administered 2022-01-23: 2.5 mg via SUBCUTANEOUS
  Filled 2022-01-23: qty 1

## 2022-01-23 NOTE — Patient Instructions (Signed)
[facility] [place]  Discharge Instructions: Thank you for choosing [facility] to provide your oncology and hematology care.  If you have a lab appointment with the Cancer Center, please go directly to the Cancer Center and check in at the registration area.  Wear comfortable clothing and clothing appropriate for easy access to any Portacath or PICC line.   We strive to give you quality time with your provider. You may need to reschedule your appointment if you arrive late (15 or more minutes).  Arriving late affects you and other patients whose appointments are after yours.  Also, if you miss three or more appointments without notifying the office, you may be dismissed from the clinic at the provider's discretion.      For prescription refill requests, have your pharmacy contact our office and allow 72 hours for refills to be completed.    Today you received the following chemotherapy and/or immunotherapy agents Velcade      To help prevent nausea and vomiting after your treatment, we encourage you to take your nausea medication as directed.  BELOW ARE SYMPTOMS THAT SHOULD BE REPORTED IMMEDIATELY: *FEVER GREATER THAN 100.4 F (38 C) OR HIGHER *CHILLS OR SWEATING *NAUSEA AND VOMITING THAT IS NOT CONTROLLED WITH YOUR NAUSEA MEDICATION *UNUSUAL SHORTNESS OF BREATH *UNUSUAL BRUISING OR BLEEDING *URINARY PROBLEMS (pain or burning when urinating, or frequent urination) *BOWEL PROBLEMS (unusual diarrhea, constipation, pain near the anus) TENDERNESS IN MOUTH AND THROAT WITH OR WITHOUT PRESENCE OF ULCERS (sore throat, sores in mouth, or a toothache) UNUSUAL RASH, SWELLING OR PAIN  UNUSUAL VAGINAL DISCHARGE OR ITCHING   Items with * indicate a potential emergency and should be followed up as soon as possible or go to the Emergency Department if any problems should occur.  Please show the CHEMOTHERAPY ALERT CARD or IMMUNOTHERAPY ALERT CARD at check-in to the  Emergency Department and triage nurse.  Should you have questions after your visit or need to cancel or reschedule your appointment, please contact [facility]  [phone] and follow the prompts.  Office hours are 8:00 a.m. to 4:30 p.m. Monday - Friday. Please note that voicemails left after 4:00 p.m. may not be returned until the following business day.  We are closed weekends and major holidays. You have access to a nurse at all times for urgent questions. Please call the main number to the clinic [phone] and follow the prompts.  For any non-urgent questions, you may also contact your provider using MyChart. We now offer e-Visits for anyone 18 and older to request care online for non-urgent symptoms. For details visit [email or web address].   Also download the MyChart app! Go to the app store, search "MyChart", open the app, select [place], and log in with your MyChart username and password.  Masks are optional in the cancer centers. If you would like for your care team to wear a mask while they are taking care of you, please let them know. For doctor visits, patients may have with them one support person who is at least 69 years old. At this time, visitors are not allowed in the infusion area.   

## 2022-01-23 NOTE — Progress Notes (Signed)
Called to infusion at patient's request to evaluate lower extremity edema. Patient noticed increased swelling of LLE today. Upon assessment, appears both lower legs are warm, well perfused, nontender, with 2+ pitting edema bilaterally. Suspect dependent edema. Recommended conservative management measures including elevation, ambulation, and compression. Patient will rtc if symptoms aren't improved.

## 2022-01-23 NOTE — Progress Notes (Signed)
Pt reports that last week his left ankle was swollen and now today both right and left ankles are swollen and that the swelling goes all the way up his left leg. Per Dr. Janese Banks proceed with Velcade and Beckey Rutter NP to assess pt in infusion.  Per Judeen Hammans RN per Dr. Janese Banks no Retacrit at this time. It is due every 3 weeks.   1210: Lauren NP assessed pt at chairside.   1214: Pt stable at discharge.

## 2022-01-29 ENCOUNTER — Other Ambulatory Visit: Payer: Self-pay | Admitting: Oncology

## 2022-01-29 DIAGNOSIS — C9000 Multiple myeloma not having achieved remission: Secondary | ICD-10-CM

## 2022-01-30 ENCOUNTER — Ambulatory Visit: Payer: Medicare Other

## 2022-01-30 ENCOUNTER — Inpatient Hospital Stay: Payer: Medicare Other

## 2022-01-30 ENCOUNTER — Inpatient Hospital Stay: Payer: Medicare Other | Attending: Oncology

## 2022-01-30 ENCOUNTER — Encounter: Payer: Self-pay | Admitting: Oncology

## 2022-01-30 ENCOUNTER — Inpatient Hospital Stay (HOSPITAL_BASED_OUTPATIENT_CLINIC_OR_DEPARTMENT_OTHER): Payer: Medicare Other | Admitting: Oncology

## 2022-01-30 VITALS — BP 137/85 | HR 65 | Temp 97.4°F | Ht 70.5 in | Wt 158.6 lb

## 2022-01-30 DIAGNOSIS — E1022 Type 1 diabetes mellitus with diabetic chronic kidney disease: Secondary | ICD-10-CM | POA: Insufficient documentation

## 2022-01-30 DIAGNOSIS — N183 Chronic kidney disease, stage 3 unspecified: Secondary | ICD-10-CM | POA: Insufficient documentation

## 2022-01-30 DIAGNOSIS — Z7969 Long term (current) use of other immunomodulators and immunosuppressants: Secondary | ICD-10-CM | POA: Diagnosis not present

## 2022-01-30 DIAGNOSIS — Z79899 Other long term (current) drug therapy: Secondary | ICD-10-CM | POA: Insufficient documentation

## 2022-01-30 DIAGNOSIS — Z7961 Long term (current) use of immunomodulator: Secondary | ICD-10-CM | POA: Diagnosis not present

## 2022-01-30 DIAGNOSIS — Z794 Long term (current) use of insulin: Secondary | ICD-10-CM | POA: Insufficient documentation

## 2022-01-30 DIAGNOSIS — N1832 Chronic kidney disease, stage 3b: Secondary | ICD-10-CM | POA: Diagnosis present

## 2022-01-30 DIAGNOSIS — E7800 Pure hypercholesterolemia, unspecified: Secondary | ICD-10-CM | POA: Insufficient documentation

## 2022-01-30 DIAGNOSIS — Z7984 Long term (current) use of oral hypoglycemic drugs: Secondary | ICD-10-CM | POA: Diagnosis not present

## 2022-01-30 DIAGNOSIS — C9000 Multiple myeloma not having achieved remission: Secondary | ICD-10-CM | POA: Insufficient documentation

## 2022-01-30 DIAGNOSIS — I129 Hypertensive chronic kidney disease with stage 1 through stage 4 chronic kidney disease, or unspecified chronic kidney disease: Secondary | ICD-10-CM | POA: Insufficient documentation

## 2022-01-30 DIAGNOSIS — Z5111 Encounter for antineoplastic chemotherapy: Secondary | ICD-10-CM

## 2022-01-30 DIAGNOSIS — D631 Anemia in chronic kidney disease: Secondary | ICD-10-CM

## 2022-01-30 DIAGNOSIS — Z7982 Long term (current) use of aspirin: Secondary | ICD-10-CM | POA: Insufficient documentation

## 2022-01-30 DIAGNOSIS — Z79624 Long term (current) use of inhibitors of nucleotide synthesis: Secondary | ICD-10-CM | POA: Insufficient documentation

## 2022-01-30 DIAGNOSIS — E10649 Type 1 diabetes mellitus with hypoglycemia without coma: Secondary | ICD-10-CM | POA: Diagnosis not present

## 2022-01-30 LAB — CBC WITH DIFFERENTIAL/PLATELET
Abs Immature Granulocytes: 0.01 10*3/uL (ref 0.00–0.07)
Basophils Absolute: 0 10*3/uL (ref 0.0–0.1)
Basophils Relative: 1 %
Eosinophils Absolute: 0.1 10*3/uL (ref 0.0–0.5)
Eosinophils Relative: 2 %
HCT: 29.6 % — ABNORMAL LOW (ref 39.0–52.0)
Hemoglobin: 9.2 g/dL — ABNORMAL LOW (ref 13.0–17.0)
Immature Granulocytes: 0 %
Lymphocytes Relative: 22 %
Lymphs Abs: 0.8 10*3/uL (ref 0.7–4.0)
MCH: 30.7 pg (ref 26.0–34.0)
MCHC: 31.1 g/dL (ref 30.0–36.0)
MCV: 98.7 fL (ref 80.0–100.0)
Monocytes Absolute: 0.3 10*3/uL (ref 0.1–1.0)
Monocytes Relative: 7 %
Neutro Abs: 2.6 10*3/uL (ref 1.7–7.7)
Neutrophils Relative %: 68 %
Platelets: 244 10*3/uL (ref 150–400)
RBC: 3 MIL/uL — ABNORMAL LOW (ref 4.22–5.81)
RDW: 16.5 % — ABNORMAL HIGH (ref 11.5–15.5)
WBC: 3.8 10*3/uL — ABNORMAL LOW (ref 4.0–10.5)
nRBC: 0 % (ref 0.0–0.2)

## 2022-01-30 LAB — COMPREHENSIVE METABOLIC PANEL
ALT: 30 U/L (ref 0–44)
AST: 28 U/L (ref 15–41)
Albumin: 3.7 g/dL (ref 3.5–5.0)
Alkaline Phosphatase: 63 U/L (ref 38–126)
Anion gap: 7 (ref 5–15)
BUN: 23 mg/dL (ref 8–23)
CO2: 25 mmol/L (ref 22–32)
Calcium: 8.6 mg/dL — ABNORMAL LOW (ref 8.9–10.3)
Chloride: 105 mmol/L (ref 98–111)
Creatinine, Ser: 1.5 mg/dL — ABNORMAL HIGH (ref 0.61–1.24)
GFR, Estimated: 50 mL/min — ABNORMAL LOW (ref >60)
Glucose, Bld: 141 mg/dL — ABNORMAL HIGH (ref 70–99)
Potassium: 4.1 mmol/L (ref 3.5–5.1)
Sodium: 137 mmol/L (ref 135–145)
Total Bilirubin: 0.4 mg/dL (ref 0.3–1.2)
Total Protein: 7.7 g/dL (ref 6.5–8.1)

## 2022-01-30 MED ORDER — BORTEZOMIB CHEMO SQ INJECTION 3.5 MG (2.5MG/ML)
1.3000 mg/m2 | Freq: Once | INTRAMUSCULAR | Status: AC
  Administered 2022-01-30: 2.5 mg via SUBCUTANEOUS
  Filled 2022-01-30: qty 1

## 2022-01-30 MED ORDER — EPOETIN ALFA-EPBX 40000 UNIT/ML IJ SOLN
40000.0000 [IU] | INTRAMUSCULAR | Status: DC
  Administered 2022-01-30: 40000 [IU] via SUBCUTANEOUS
  Filled 2022-01-30: qty 1

## 2022-01-30 MED ORDER — DEXAMETHASONE 4 MG PO TABS
40.0000 mg | ORAL_TABLET | Freq: Once | ORAL | Status: AC
  Administered 2022-01-30: 40 mg via ORAL
  Filled 2022-01-30: qty 10

## 2022-01-30 NOTE — Patient Instructions (Signed)
[facility] [place]  Discharge Instructions: Thank you for choosing [facility] to provide your oncology and hematology care.  If you have a lab appointment with the [facility], please go directly to the [facility] and check in at the registration area.  Wear comfortable clothing and clothing appropriate for easy access to any Portacath or PICC line.   We strive to give you quality time with your provider. You may need to reschedule your appointment if you arrive late (15 or more minutes).  Arriving late affects you and other patients whose appointments are after yours.  Also, if you miss three or more appointments without notifying the office, you may be dismissed from the clinic at the provider's discretion.      For prescription refill requests, have your pharmacy contact our office and allow 72 hours for refills to be completed.    Today you received the following chemotherapy and/or immunotherapy agents velcade, decadron, retacrit      To help prevent nausea and vomiting after your treatment, we encourage you to take your nausea medication as directed.  BELOW ARE SYMPTOMS THAT SHOULD BE REPORTED IMMEDIATELY: *FEVER GREATER THAN 100.4 F (38 C) OR HIGHER *CHILLS OR SWEATING *NAUSEA AND VOMITING THAT IS NOT CONTROLLED WITH YOUR NAUSEA MEDICATION *UNUSUAL SHORTNESS OF BREATH *UNUSUAL BRUISING OR BLEEDING *URINARY PROBLEMS (pain or burning when urinating, or frequent urination) *BOWEL PROBLEMS (unusual diarrhea, constipation, pain near the anus) TENDERNESS IN MOUTH AND THROAT WITH OR WITHOUT PRESENCE OF ULCERS (sore throat, sores in mouth, or a toothache) UNUSUAL RASH, SWELLING OR PAIN  UNUSUAL VAGINAL DISCHARGE OR ITCHING   Items with * indicate a potential emergency and should be followed up as soon as possible or go to the Emergency Department if any problems should occur.  Please show the CHEMOTHERAPY ALERT CARD or IMMUNOTHERAPY ALERT  CARD at check-in to the Emergency Department and triage nurse.  Should you have questions after your visit or need to cancel or reschedule your appointment, please contact [facility] [place] [facility] [place]  [phone] and follow the prompts.  Office hours are 8:00 a.m. to 4:30 p.m. Monday - Friday. Please note that voicemails left after 4:00 p.m. may not be returned until the following business day.  We are closed weekends and major holidays. You have access to a nurse at all times for urgent questions. Please call the main number to the clinic [phone] and follow the prompts.  For any non-urgent questions, you may also contact your provider using MyChart. We now offer e-Visits for anyone 46 and older to request care online for non-urgent symptoms. For details visit [email or web address].   Also download the MyChart app! Go to the app store, search "MyChart", open the app, select [place], and log in with your MyChart username and password.  Masks are optional in the cancer centers. If you would like for your care team to wear a mask while they are taking care of you, please let them know. For doctor visits, patients may have with them one support person who is at least 69 years old. At this time, visitors are not allowed in the infusion area.

## 2022-01-30 NOTE — Progress Notes (Signed)
Hematology/Oncology Consult note [facility]  Telephone:([phone] [phone]  Patient Care Team: Dion Body, MD as PCP - General (Family Medicine) Pa, Ward (Optometry) Sindy Guadeloupe, MD as Consulting Physician (Oncology)   Name of the patient: Nicholas Weiss  [id number]  1950-07-10   Date of visit: 01/30/22  Diagnosis- gh risk IgG lambda multiple myeloma R-ISS stage III   Chief complaint/ Reason for visit-on treatment assessment prior to cycle 5-day 8 of Velcade  Heme/Onc history: Patient is a 71 year old African-American male with a past medical history significant for uncontrolled type 1 diabetes, chronic kidney disease stage III chronic normocytic anemia referred for abnormal SPEP. Patient's creatinine has been fluctuating between 1.5-2 but about 5 months ago it went up all the way to 5 and his blood sugars were in the 1000 range. More recently his kidney numbers have been drifting back to normal values. As a part of the work-up he had serum protein electrophoresis done which showed an elevated gammaglobulin fraction with an M spike of 3%. The amount of M spike was not quantified in the specimen. He has therefore been referred to [place] for further management. Patient endorses chronic fatigue reports that his appetite and weight have remained stable   Results of blood work from 04/08/2020 were as follows: CBC showed white count of 2.9, H&H of 10.5/31.2 with an MCV of 91 and a platelet count of 191.  CMP showed a mildly elevated creatinine of 1.2 which was better as compared to 5 months ago when it was 3.9.  Total protein was mildly elevated at 9.1 calcium normal at 8.4 ferritin and iron studies B12 folate TSH and haptoglobin were normal.  Myeloma panel revealed an elevated IgG level of 06/04/2004 with an M protein of 3.1 g.  Immunofixation showed IgG lambda specificity.  Serum free light chain ratio was elevated at 25 and free light chain  lambda elevated at 370   Further myeloma work-up including a PET CT scan did not reveal any evidence of edematous lesions.  Bone marrow biopsy showed 23% plasma cells by manual count and 30% by CD138 IHC.  Normal cytogenetics.  FISH studies for myeloma showed gain of 1 q.  13 q-. detected.  P53 not detected.   He was treated for a year and has having smoldering multiple myeloma since both CKD and anemia have been stable for 3 to 4 years and could be secondary to uncontrolled diabetes.In June 2023 IgG levels increased to 5463 with an M protein of 4.1 g as compared to 2.6 g in September 2022.  Serum free light chain ratio remains around 25.  Therefore a repeat bone marrow biopsy was done which shows further increase in plasma cells ranging from 30 to 70% and by immunohistochemistry 60 to 70% of the cells were positive for CD138.  Cytogenetics normal.  FISH study showed 4: 14 translocation and gain of 1 q. making this high risk RISS stage III.  Repeat PET scan showed no lytic lesions      Interval history- Patient was hospitalized in September 2023 for another episode of hyperglycemia.  Presently he is doing well.  Reports being compliant with Revlimid.  ECOG PS- 1 Pain scale- 0   Review of systems- Review of Systems  Constitutional:  Positive for malaise/fatigue. Negative for chills, fever and weight loss.  HENT:  Negative for congestion, ear discharge and nosebleeds.   Eyes:  Negative for blurred vision.  Respiratory:  Negative for  cough, hemoptysis, sputum production, shortness of breath and wheezing.   Cardiovascular:  Negative for chest pain, palpitations, orthopnea and claudication.  Gastrointestinal:  Negative for abdominal pain, blood in stool, constipation, diarrhea, heartburn, melena, nausea and vomiting.  Genitourinary:  Negative for dysuria, flank pain, frequency, hematuria and urgency.  Musculoskeletal:  Negative for back pain, joint pain and myalgias.  Skin:  Negative for rash.   Neurological:  Negative for dizziness, tingling, focal weakness, seizures, weakness and headaches.  Endo/Heme/Allergies:  Does not bruise/bleed easily.  Psychiatric/Behavioral:  Negative for depression and suicidal ideas. The patient does not have insomnia.       Allergies  Allergen Reactions   Penicillins Other (See Comments)    Has patient had a PCN reaction causing immediate rash, facial/tongue/throat swelling, SOB or lightheadedness with hypotension: No Has patient had a PCN reaction causing severe rash involving mucus membranes or skin necrosis: No Has patient had a PCN reaction that required hospitalization No Has patient had a PCN reaction occurring within the last 10 years: No If all of the above answers are "NO", then may proceed with Cephalosporin use.  Other reaction(s): Other (see comments) Other reaction(s): Other (See Comments) Has patient had a PCN reaction causing immediate rash, facial/tongue/throat swelling, SOB or lightheadedness with hypotension: No Has patient had a PCN reaction causing severe rash involving mucus membranes or skin necrosis: No Has patient had a PCN reaction that required hospitalization No Has patient had a PCN reaction occurring within the last 10 years: No If all of the above answers are "NO", then may proceed with Cephalosporin use. Has patient had a PCN reaction causing immediate rash, facial/tongue/throat swelling, SOB or lightheadedness with hypotension: No Has patient had a PCN reaction causing severe rash involving mucus membranes or skin necrosis: No Has patient had a PCN reaction that required hospitalization No Has patient had a PCN reaction occurring within the last 10 years: No If all of the above answers are "NO", then may proceed with Cephalosporin use.     Past Medical History:  Diagnosis Date   DKA (diabetic ketoacidoses) 04/06/2016   Hypercholesteremia    Hypertension      Past Surgical History:  Procedure Laterality Date    COLONOSCOPY WITH PROPOFOL N/A 02/01/2020   Procedure: COLONOSCOPY WITH PROPOFOL;  Surgeon: Lin Landsman, MD;  Location: [facility] ENDOSCOPY;  Service: Gastroenterology;  Laterality: N/A;   ESOPHAGOGASTRODUODENOSCOPY  02/01/2020   Procedure: ESOPHAGOGASTRODUODENOSCOPY (EGD);  Surgeon: Lin Landsman, MD;  Location: [facility] ENDOSCOPY;  Service: Gastroenterology;;   KNEE SURGERY Right    Torn meniscus   KNEE SURGERY Left     Social History   Socioeconomic History   Marital status: Single    Spouse name: Not on file   Number of children: 3   Years of education: Not on file   Highest education level: High school graduate  Occupational History    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  Tobacco Use   Smoking status: Never   Smokeless tobacco: Never  Vaping Use   Vaping Use: Never used  Substance and Sexual Activity   Alcohol use: Not Currently    Alcohol/week: 0.0 - 1.0 standard drinks of alcohol    Comment: "once every 2 months"   Drug use: Yes    Types: Marijuana, "Crack" cocaine    Comment: last week   Sexual activity: Yes    Birth control/protection: None  Other Topics Concern   Not on file  Social History Narrative   Lives with  girlfriend "sharon"   Social Determinants of Health   Financial Resource Strain: Low Risk  (01/01/2020)   Overall Financial Resource Strain (CARDIA)    Difficulty of Paying Living Expenses: Not hard at all  Food Insecurity: No Food Insecurity (12/07/2021)   Hunger Vital Sign    Worried About Running Out of Food in the Last Year: Never true    Ran Out of Food in the Last Year: Never true  Transportation Needs: No Transportation Needs (12/07/2021)   PRAPARE - Hydrologist (Medical): No    Lack of Transportation (Non-Medical): No  Physical Activity: Insufficiently Active (01/01/2020)   Exercise Vital Sign    Days of Exercise per Week: 7 days    Minutes of Exercise per Session: 20 min  Stress: No Stress Concern Present  (01/01/2020)   [place]    Feeling of Stress : Not at all  Social Connections: Moderately Isolated (01/01/2020)   Social Connection and Isolation Panel [NHANES]    Frequency of Communication with Friends and Family: More than three times a week    Frequency of Social Gatherings with Friends and Family: More than three times a week    Attends Religious Services: Never    Marine scientist or Organizations: No    Attends Archivist Meetings: Never    Marital Status: Living with partner  Intimate Partner Violence: Not At Risk (12/07/2021)   Humiliation, Afraid, Rape, and Kick questionnaire    Fear of Current or Ex-Partner: No    Emotionally Abused: No    Physically Abused: No    Sexually Abused: No    Family History  Problem Relation Age of Onset   Heart attack Father    Hypertension Sister    Cancer Sister      Current Outpatient Medications:    acyclovir (ZOVIRAX) 400 MG tablet, Take 1 tablet (400 mg total) by mouth 2 (two) times daily., Disp: 60 tablet, Rfl: 3   Alcohol Swabs (B-D SINGLE USE SWABS REGULAR) PADS, Use to check blood sugar four times daily for type 1 diabetes, Disp: 300 each, Rfl: 4   amLODipine (NORVASC) 10 MG tablet, Take 1 tablet (10 mg total) by mouth daily., Disp: 30 tablet, Rfl: 2   ASPIRIN 81 PO, Take 81 mg by mouth daily., Disp: , Rfl:    atorvastatin (LIPITOR) 80 MG tablet, Take 1 tablet (80 mg total) by mouth at bedtime., Disp: 30 tablet, Rfl: 2   baclofen 5 MG TABS, Take 5 mg by mouth 3 (three) times daily as needed for muscle spasms., Disp: 60 tablet, Rfl: 1   Blood Glucose Calibration (TRUE METRIX LEVEL 1) Low SOLN, Use to check blood sugar four times daily for type 1 diabetes, Disp: 1 each, Rfl: 2   FARXIGA 10 MG TABS tablet, Take 10 mg by mouth daily., Disp: , Rfl:    gabapentin (NEURONTIN) 300 MG capsule, Take 300 mg by mouth in the morning and 600 mg at night., Disp: ,  Rfl:    insulin degludec (TRESIBA FLEXTOUCH) 100 UNIT/ML FlexTouch Pen, Inject 20 Units into the skin daily. Home dose., Disp: , Rfl:    Insulin Syringe-Needle U-100 (INSULIN SYRINGE 1CC/31GX5/16") 31G X 5/16" 1 ML MISC, For insulin injections up to 4 times daily, Disp: 270 each, Rfl: 4   lenalidomide (REVLIMID) 10 MG capsule, Take 1 capsule (10 mg total) by mouth daily. Take for 14 days, then hold for  7 days. Repeat every 21 days., Disp: 14 capsule, Rfl: 0   lisinopril (ZESTRIL) 10 MG tablet, Take 1 tablet (10 mg total) by mouth daily., Disp: 30 tablet, Rfl: 2   NOVOLOG 100 UNIT/ML injection, INJECT UP TO 14 UNITS THREE TIMES DAILY BEFORE MEALS ACCORDING TO SLIDING SCALE, Disp: 10 mL, Rfl: 1   clopidogrel (PLAVIX) 75 MG tablet, Take by mouth. (Patient not taking: Reported on 12/12/2021), Disp: , Rfl:    dicyclomine (BENTYL) 20 MG tablet, Take 1 tablet (20 mg total) by mouth 3 (three) times daily with meals as needed for spasms., Disp: 90 tablet, Rfl: 0   HYDROcodone-acetaminophen (NORCO/VICODIN) 5-325 MG tablet, Take by mouth. (Patient not taking: Reported on 12/12/2021), Disp: , Rfl:  No current facility-administered medications for this visit.  Facility-Administered Medications Ordered in Other Visits:    epoetin alfa-epbx (RETACRIT) injection 40,000 Units, 40,000 Units, Subcutaneous, Q21 days, Sindy Guadeloupe, MD, 40,000 Units at 01/30/22 1043  Physical exam:  Vitals:   01/30/22 0916  BP: 137/85  Pulse: 65  Temp: (!) 97.4 F (36.3 C)  TempSrc: Tympanic  Weight: 158 lb 9.6 oz (71.9 kg)  Height: 5' 10.5" (1.791 m)   Physical Exam Constitutional:      General: He is not in acute distress. Cardiovascular:     Rate and Rhythm: Normal rate and regular rhythm.     Heart sounds: Normal heart sounds.  Pulmonary:     Effort: Pulmonary effort is normal.     Breath sounds: Normal breath sounds.  Abdominal:     General: Bowel sounds are normal.     Palpations: Abdomen is soft.  Skin:     General: Skin is warm and dry.  Neurological:     Mental Status: He is alert and oriented to person, place, and time.         Latest Ref Rng & Units 01/30/2022    9:03 AM  CMP  Glucose 70 - 99 mg/dL 141   BUN 8 - 23 mg/dL 23   Creatinine 0.61 - 1.24 mg/dL 1.50   Sodium 135 - 145 mmol/L 137   Potassium 3.5 - 5.1 mmol/L 4.1   Chloride 98 - 111 mmol/L 105   CO2 22 - 32 mmol/L 25   Calcium 8.9 - 10.3 mg/dL 8.6   Total Protein 6.5 - 8.1 g/dL 7.7   Total Bilirubin 0.3 - 1.2 mg/dL 0.4   Alkaline Phos 38 - 126 U/L 63   AST 15 - 41 U/L 28   ALT 0 - 44 U/L 30       Latest Ref Rng & Units 01/30/2022    9:03 AM  CBC  WBC 4.0 - 10.5 K/uL 3.8   Hemoglobin 13.0 - 17.0 g/dL 9.2   Hematocrit 39.0 - 52.0 % 29.6   Platelets 150 - 400 K/uL 244     Assessment and plan- Patient is a 71 y.o. male with R-ISS stage III high risk IgG lambda multiple myeloma.  He is here for on treatment assessment prior to cycle 5-day 8 of Velcade  Counts okay to proceed with cycle 5-day 8 of Velcade today.  He is receiving Velcade on a weekly basis.  He is also on Revlimid 10 mg 2 weeks on and 1 week off.  He will take Revlimid for 1 more week followed by a week off.  Patient missed his prior appointment to [facility].  He is willing to see them at this time.  Based on their assessment we  will decide if he would be a potential candidate for transplant down the line.  Overall patient is a brittle diabetic with frequent hospitalizations and  history of cocaine use.  Urine drug screen was positive for cocaine in September 2023 along with cannabinoids.  I therefore doubt that he would be a candidate for transplant but I will defer that to [facility].  I have therefore not added daratumumab to his regimen.  Continue acyclovir prophylaxis along with low-dose aspirin.  Patient does not have any baseline lytic bone lesions however I will plan to start him on Zometa for prophylaxis every 3 months.  Patient is edentulous and therefore does  not require any dental clearance.  Anemia of chronic kidney disease: He is on Retacrit every 3 weeks and will receive his next dose today   Visit Diagnosis 1. Multiple myeloma not having achieved remission ([place])   2. High risk medication use   3. Encounter for antineoplastic chemotherapy      Dr. Randa Evens, MD, MPH [facility] at [facility] [phone] 01/30/2022 12:51 PM

## 2022-01-31 ENCOUNTER — Other Ambulatory Visit: Payer: Self-pay

## 2022-02-04 ENCOUNTER — Other Ambulatory Visit: Payer: Self-pay

## 2022-02-04 DIAGNOSIS — C9000 Multiple myeloma not having achieved remission: Secondary | ICD-10-CM

## 2022-02-04 MED ORDER — LENALIDOMIDE 10 MG PO CAPS: ORAL_CAPSULE | ORAL | 0 refills | Status: DC

## 2022-02-05 ENCOUNTER — Other Ambulatory Visit: Payer: Self-pay

## 2022-02-05 DIAGNOSIS — C9000 Multiple myeloma not having achieved remission: Secondary | ICD-10-CM

## 2022-02-05 MED ORDER — LENALIDOMIDE 10 MG PO CAPS: ORAL_CAPSULE | ORAL | 0 refills | Status: DC

## 2022-02-05 NOTE — Addendum Note (Signed)
Addended by: Betti Cruz on: 02/05/2022 10:49 AM   Modules accepted: Orders

## 2022-02-06 ENCOUNTER — Inpatient Hospital Stay: Payer: Medicare Other

## 2022-02-06 ENCOUNTER — Inpatient Hospital Stay (HOSPITAL_BASED_OUTPATIENT_CLINIC_OR_DEPARTMENT_OTHER): Payer: Medicare Other | Admitting: Internal Medicine

## 2022-02-06 ENCOUNTER — Other Ambulatory Visit: Payer: Self-pay

## 2022-02-06 ENCOUNTER — Encounter: Payer: Self-pay | Admitting: Internal Medicine

## 2022-02-06 ENCOUNTER — Other Ambulatory Visit: Payer: Self-pay | Admitting: Pharmacist

## 2022-02-06 VITALS — BP 114/76 | HR 76 | Temp 97.8°F | Resp 16 | Wt 156.0 lb

## 2022-02-06 DIAGNOSIS — C9000 Multiple myeloma not having achieved remission: Secondary | ICD-10-CM

## 2022-02-06 DIAGNOSIS — Z8673 Personal history of transient ischemic attack (TIA), and cerebral infarction without residual deficits: Secondary | ICD-10-CM | POA: Diagnosis not present

## 2022-02-06 LAB — COMPREHENSIVE METABOLIC PANEL
ALT: 29 U/L (ref 0–44)
AST: 25 U/L (ref 15–41)
Albumin: 3.3 g/dL — ABNORMAL LOW (ref 3.5–5.0)
Alkaline Phosphatase: 65 U/L (ref 38–126)
Anion gap: 9 (ref 5–15)
BUN: 29 mg/dL — ABNORMAL HIGH (ref 8–23)
CO2: 25 mmol/L (ref 22–32)
Calcium: 8.5 mg/dL — ABNORMAL LOW (ref 8.9–10.3)
Chloride: 102 mmol/L (ref 98–111)
Creatinine, Ser: 1.55 mg/dL — ABNORMAL HIGH (ref 0.61–1.24)
GFR, Estimated: 48 mL/min — ABNORMAL LOW (ref >60)
Glucose, Bld: 181 mg/dL — ABNORMAL HIGH (ref 70–99)
Potassium: 3.9 mmol/L (ref 3.5–5.1)
Sodium: 136 mmol/L (ref 135–145)
Total Bilirubin: 0.4 mg/dL (ref 0.3–1.2)
Total Protein: 6.9 g/dL (ref 6.5–8.1)

## 2022-02-06 LAB — CBC WITH DIFFERENTIAL/PLATELET
Abs Immature Granulocytes: 0.02 10*3/uL (ref 0.00–0.07)
Basophils Absolute: 0 10*3/uL (ref 0.0–0.1)
Basophils Relative: 1 %
Eosinophils Absolute: 0.3 10*3/uL (ref 0.0–0.5)
Eosinophils Relative: 9 %
HCT: 28.7 % — ABNORMAL LOW (ref 39.0–52.0)
Hemoglobin: 9 g/dL — ABNORMAL LOW (ref 13.0–17.0)
Immature Granulocytes: 1 %
Lymphocytes Relative: 21 %
Lymphs Abs: 0.6 10*3/uL — ABNORMAL LOW (ref 0.7–4.0)
MCH: 31 pg (ref 26.0–34.0)
MCHC: 31.4 g/dL (ref 30.0–36.0)
MCV: 99 fL (ref 80.0–100.0)
Monocytes Absolute: 0.6 10*3/uL (ref 0.1–1.0)
Monocytes Relative: 21 %
Neutro Abs: 1.4 10*3/uL — ABNORMAL LOW (ref 1.7–7.7)
Neutrophils Relative %: 47 %
Platelets: 208 10*3/uL (ref 150–400)
RBC: 2.9 MIL/uL — ABNORMAL LOW (ref 4.22–5.81)
RDW: 16.7 % — ABNORMAL HIGH (ref 11.5–15.5)
WBC: 2.9 10*3/uL — ABNORMAL LOW (ref 4.0–10.5)
nRBC: 0 % (ref 0.0–0.2)

## 2022-02-06 MED ORDER — BORTEZOMIB CHEMO SQ INJECTION 3.5 MG (2.5MG/ML)
1.3000 mg/m2 | Freq: Once | INTRAMUSCULAR | Status: AC
  Administered 2022-02-06: 2.5 mg via SUBCUTANEOUS
  Filled 2022-02-06: qty 1

## 2022-02-06 MED ORDER — LENALIDOMIDE 10 MG PO CAPS: ORAL_CAPSULE | ORAL | 0 refills | Status: DC

## 2022-02-06 MED ORDER — DEXAMETHASONE 4 MG PO TABS
40.0000 mg | ORAL_TABLET | Freq: Once | ORAL | Status: AC
  Administered 2022-02-06: 40 mg via ORAL
  Filled 2022-02-06: qty 10

## 2022-02-06 NOTE — Progress Notes (Signed)
Pt in for follow up, pt reports has noticed some change in gait when walking at times.  Pt has voiced concerns regarding revlimid medication, oral chemo pharmacist to speak with pt today.

## 2022-02-06 NOTE — Progress Notes (Signed)
[facility] [place], [place] [address] [phone]   Interval Evaluation  Date of Service: 02/06/22 Patient Name: Nicholas Weiss Patient MRN: [id number] Patient DOB: 1950-07-04 Provider: Ventura Sellers, MD  Identifying Statement:  Nicholas Weiss is a 71 y.o. male with History of CVA (cerebrovascular accident) who presents for initial consultation and evaluation regarding cancer associated neurologic deficits.    Referring Provider: Dion Body, MD [facility] [place],  [place] [address]  Primary Cancer:  Oncologic History: Oncology History  Multiple myeloma ([place])  09/29/2021 Initial Diagnosis   Multiple myeloma ([place])   09/29/2021 Cancer Staging   Staging form: Plasma Cell Myeloma and Plasma Cell Disorders, AJCC 8th Edition - Clinical stage from 09/29/2021: No stage assigned - Signed by Sindy Guadeloupe, MD on 09/29/2021 Stage prefix: Initial diagnosis   10/17/2021 Cancer Staging   Staging form: Plasma Cell Myeloma and Plasma Cell Disorders, AJCC 8th Edition - Clinical stage from 10/17/2021: RISS Stage III (Beta-2-microglobulin (mg/L): 5.8, Albumin (g/dL): 3.3, ISS: Stage III, High-risk cytogenetics: Present, LDH: Normal) - Signed by Sindy Guadeloupe, MD on 10/17/2021 Stage prefix: Initial diagnosis Beta 2 microglobulin range (mg/L): Greater than or equal to 5.5 Albumin range (g/dL): Less than 3.5 Cytogenetics: 1q addition, t(4;14) translocation Serum calcium level: Normal Serum creatinine level: Elevated   Multiple myeloma not having achieved remission ([place])  09/29/2021 Initial Diagnosis   Multiple myeloma not having achieved remission ([place])   10/03/2021 - 11/14/2021 Chemotherapy   Patient is on Treatment Plan : MYELOMA NON-TRANSPLANT CANDIDATES VRd weekly q21d     11/21/2021 -  Chemotherapy   Patient is on Treatment Plan : MYELOMA NON-TRANSPLANT CANDIDATES VRd weekly q21d       Interval  History: Nicholas Weiss presents today for follow up.  No new or progressive neurologic symptoms.  He still has some episodes of jerking in his arms or hands, but this is typically ass'd with low blood sugar.  Recently hospitalized for DKA.  Continues on velcade, revlimid for myeloma.  H+P Patient presents today for follow up after recent stroke admission.  On May 13th of this year, he experienced impairment or loss of consciousness, fall at home.  He returned to normal within minutes, no residual weakness or numbness.  CNS imaging demonstrated left subcortical infarct.  Following complete workup, he was discharged to home.  Currently he denies any focal symptoms, but does describe twitching or flinging of his hands, arms, shoulders at times.  This has been ongoing for more than a year.    Medications: Current Outpatient Medications on File Prior to Visit  Medication Sig Dispense Refill   acyclovir (ZOVIRAX) 400 MG tablet Take 1 tablet (400 mg total) by mouth 2 (two) times daily. 60 tablet 3   Alcohol Swabs (B-D SINGLE USE SWABS REGULAR) PADS Use to check blood sugar four times daily for type 1 diabetes 300 each 4   amLODipine (NORVASC) 10 MG tablet Take 1 tablet (10 mg total) by mouth daily. 30 tablet 2   ASPIRIN 81 PO Take 81 mg by mouth daily.     atorvastatin (LIPITOR) 80 MG tablet Take 1 tablet (80 mg total) by mouth at bedtime. 30 tablet 2   baclofen 5 MG TABS Take 5 mg by mouth 3 (three) times daily as needed for muscle spasms. 60 tablet 1   Blood Glucose Calibration (TRUE METRIX LEVEL 1) Low SOLN Use to check blood  sugar four times daily for type 1 diabetes 1 each 2   clopidogrel (PLAVIX) 75 MG tablet Take by mouth.     FARXIGA 10 MG TABS tablet Take 10 mg by mouth daily.     gabapentin (NEURONTIN) 300 MG capsule Take 300 mg by mouth in the morning and 600 mg at night.     insulin degludec (TRESIBA FLEXTOUCH) 100 UNIT/ML FlexTouch Pen Inject 20 Units into the skin daily. Home dose.      Insulin Syringe-Needle U-100 (INSULIN SYRINGE 1CC/31GX5/16") 31G X 5/16" 1 ML MISC For insulin injections up to 4 times daily 270 each 4   lisinopril (ZESTRIL) 10 MG tablet Take 1 tablet (10 mg total) by mouth daily. 30 tablet 2   NOVOLOG 100 UNIT/ML injection INJECT UP TO 14 UNITS THREE TIMES DAILY BEFORE MEALS ACCORDING TO SLIDING SCALE 10 mL 1   dicyclomine (BENTYL) 20 MG tablet Take 1 tablet (20 mg total) by mouth 3 (three) times daily with meals as needed for spasms. (Patient not taking: Reported on 02/06/2022) 90 tablet 0   ondansetron (ZOFRAN) 8 MG tablet Take 8 mg by mouth 2 (two) times daily as needed. (Patient not taking: Reported on 02/06/2022)     [DISCONTINUED] prochlorperazine (COMPAZINE) 10 MG tablet Take 1 tablet (10 mg total) by mouth every 6 (six) hours as needed (Nausea or vomiting). (Patient not taking: Reported on 10/17/2021) 30 tablet 1   No current facility-administered medications on file prior to visit.    Allergies:  Allergies  Allergen Reactions   Penicillins Other (See Comments)    Has patient had a PCN reaction causing immediate rash, facial/tongue/throat swelling, SOB or lightheadedness with hypotension: No Has patient had a PCN reaction causing severe rash involving mucus membranes or skin necrosis: No Has patient had a PCN reaction that required hospitalization No Has patient had a PCN reaction occurring within the last 10 years: No If all of the above answers are "NO", then may proceed with Cephalosporin use.  Other reaction(s): Other (see comments) Other reaction(s): Other (See Comments) Has patient had a PCN reaction causing immediate rash, facial/tongue/throat swelling, SOB or lightheadedness with hypotension: No Has patient had a PCN reaction causing severe rash involving mucus membranes or skin necrosis: No Has patient had a PCN reaction that required hospitalization No Has patient had a PCN reaction occurring within the last 10 years: No If all of the  above answers are "NO", then may proceed with Cephalosporin use. Has patient had a PCN reaction causing immediate rash, facial/tongue/throat swelling, SOB or lightheadedness with hypotension: No Has patient had a PCN reaction causing severe rash involving mucus membranes or skin necrosis: No Has patient had a PCN reaction that required hospitalization No Has patient had a PCN reaction occurring within the last 10 years: No If all of the above answers are "NO", then may proceed with Cephalosporin use.   Past Medical History:  Past Medical History:  Diagnosis Date   DKA (diabetic ketoacidoses) 04/06/2016   Hypercholesteremia    Hypertension    Past Surgical History:  Past Surgical History:  Procedure Laterality Date   COLONOSCOPY WITH PROPOFOL N/A 02/01/2020   Procedure: COLONOSCOPY WITH PROPOFOL;  Surgeon: Lin Landsman, MD;  Location: [facility] ENDOSCOPY;  Service: Gastroenterology;  Laterality: N/A;   ESOPHAGOGASTRODUODENOSCOPY  02/01/2020   Procedure: ESOPHAGOGASTRODUODENOSCOPY (EGD);  Surgeon: Lin Landsman, MD;  Location: [facility] ENDOSCOPY;  Service: Gastroenterology;;   KNEE SURGERY Right    Torn meniscus   KNEE SURGERY Left  Social History:  Social History   Socioeconomic History   Marital status: Single    Spouse name: Not on file   Number of children: 3   Years of education: Not on file   Highest education level: High school graduate  Occupational History    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  Tobacco Use   Smoking status: Never   Smokeless tobacco: Never  Vaping Use   Vaping Use: Never used  Substance and Sexual Activity   Alcohol use: Not Currently    Alcohol/week: 0.0 - 1.0 standard drinks of alcohol    Comment: "once every 2 months"   Drug use: Yes    Types: Marijuana, "Crack" cocaine    Comment: last week   Sexual activity: Yes    Birth control/protection: None  Other Topics Concern   Not on file  Social History Narrative   Lives with girlfriend  "sharon"   Social Determinants of Health   Financial Resource Strain: Low Risk  (01/01/2020)   Overall Financial Resource Strain (CARDIA)    Difficulty of Paying Living Expenses: Not hard at all  Food Insecurity: No Food Insecurity (12/07/2021)   Hunger Vital Sign    Worried About Running Out of Food in the Last Year: Never true    Ran Out of Food in the Last Year: Never true  Transportation Needs: No Transportation Needs (12/07/2021)   PRAPARE - Hydrologist (Medical): No    Lack of Transportation (Non-Medical): No  Physical Activity: Insufficiently Active (01/01/2020)   Exercise Vital Sign    Days of Exercise per Week: 7 days    Minutes of Exercise per Session: 20 min  Stress: No Stress Concern Present (01/01/2020)   [place]    Feeling of Stress : Not at all  Social Connections: Moderately Isolated (01/01/2020)   Social Connection and Isolation Panel [NHANES]    Frequency of Communication with Friends and Family: More than three times a week    Frequency of Social Gatherings with Friends and Family: More than three times a week    Attends Religious Services: Never    Marine scientist or Organizations: No    Attends Archivist Meetings: Never    Marital Status: Living with partner  Intimate Partner Violence: Not At Risk (12/07/2021)   Humiliation, Afraid, Rape, and Kick questionnaire    Fear of Current or Ex-Partner: No    Emotionally Abused: No    Physically Abused: No    Sexually Abused: No   Family History:  Family History  Problem Relation Age of Onset   Heart attack Father    Hypertension Sister    Cancer Sister     Review of Systems: Constitutional: Doesn't report fevers, chills or abnormal weight loss Eyes: Doesn't report blurriness of vision Ears, nose, mouth, throat, and face: Doesn't report sore throat Respiratory: Doesn't report cough, dyspnea or  wheezes Cardiovascular: Doesn't report palpitation, chest discomfort  Gastrointestinal:  Doesn't report nausea, constipation, diarrhea GU: Doesn't report incontinence Skin: Doesn't report skin rashes Neurological: Per HPI Musculoskeletal: Doesn't report joint pain Behavioral/Psych: Doesn't report anxiety  Physical Exam:    02/06/2022   10:31 AM 01/30/2022    9:16 AM 01/23/2022   10:20 AM  Vitals with BMI  Height  5' 10.5"   Weight 156 lbs 158 lbs 10 oz 161 lbs 8 oz  BMI 09.32 35.57   Systolic 322 025 427  Diastolic  76 85 79  Pulse 76 65 85    KPS: 90. General: Alert, cooperative, pleasant, in no acute distress Head: Normal EENT: No conjunctival injection or scleral icterus.  Lungs: Resp effort normal Cardiac: Regular rate Abdomen: Non-distended abdomen Skin: No rashes cyanosis or petechiae. Extremities: No clubbing or edema  Neurologic Exam: Mental Status: Awake, alert, attentive to examiner. Oriented to self and environment. Language is fluent with intact comprehension.  Cranial Nerves: Visual acuity is grossly normal. Visual fields are full. Extra-ocular movements intact. No ptosis. Face is symmetric Motor: Tone and bulk are normal. Power is full in both arms and legs.  Mild asterixis with posturing. Reflexes are symmetric, no pathologic reflexes present.  Sensory: Intact to light touch Gait: Normal.   Labs: I have reviewed the data as listed    Component Value Date/Time   NA 137 01/30/2022 0903   NA 135 10/03/2020 1317   NA 137 10/11/2012 0542   K 4.1 01/30/2022 0903   K 4.2 10/11/2012 0542   CL 105 01/30/2022 0903   CL 105 10/11/2012 0542   CO2 25 01/30/2022 0903   CO2 26 10/11/2012 0542   GLUCOSE 141 (H) 01/30/2022 0903   GLUCOSE 239 (H) 10/11/2012 0542   BUN 23 01/30/2022 0903   BUN 24 10/03/2020 1317   BUN 17 10/11/2012 0542   CREATININE 1.50 (H) 01/30/2022 0903   CREATININE 1.36 (H) 10/11/2012 0542   CALCIUM 8.6 (L) 01/30/2022 0903   CALCIUM 8.7  10/11/2012 0542   PROT 7.7 01/30/2022 0903   PROT 9.1 (H) 10/03/2020 1317   ALBUMIN 3.7 01/30/2022 0903   ALBUMIN 3.3 (L) 10/03/2020 1317   AST 28 01/30/2022 0903   ALT 30 01/30/2022 0903   ALKPHOS 63 01/30/2022 0903   BILITOT 0.4 01/30/2022 0903   BILITOT 0.4 10/03/2020 1317   GFRNONAA 50 (L) 01/30/2022 0903   GFRNONAA 57 (L) 10/11/2012 0542   GFRAA 54 (L) 03/15/2020 1056   GFRAA >60 10/11/2012 0542   Lab Results  Component Value Date   WBC 2.9 (L) 02/06/2022   NEUTROABS 1.4 (L) 02/06/2022   HGB 9.0 (L) 02/06/2022   HCT 28.7 (L) 02/06/2022   MCV 99.0 02/06/2022   PLT 208 02/06/2022     Assessment/Plan History of CVA (cerebrovascular accident)  Nicholas Weiss is clinically stable today.  We recommended he continue high dose statin and 14m aspirin.  Hypertension regimen managed by PCP.  We counseled further today on secondary stroke prevention, diet and exercise in particular.  His involuntary movements are most consistent with asterixis and myoclonus.  These are improved today, and we agree they could be provoked by hypoglyemia.  He will continue to keep a close watch on his sugars at home.  We appreciate the opportunity to participate in the care of TKEATON Weiss   We ask that Nicholas PALAZZIreturn to clinic as needed.  All questions were answered. The patient knows to call the clinic with any problems, questions or concerns. No barriers to learning were detected.  The total time spent in the encounter was 30 minutes and more than 50% was on counseling and review of test results   ZVentura Sellers MD Medical Director of Neuro-Oncology [facility] [place]11/10/23 10:47 AM

## 2022-02-06 NOTE — Progress Notes (Signed)
Patient provided with updated Revlimid calendar while in clinic today

## 2022-02-09 LAB — KAPPA/LAMBDA LIGHT CHAINS
Kappa free light chain: 20.1 mg/L — ABNORMAL HIGH (ref 3.3–19.4)
Kappa, lambda light chain ratio: 0.16 — ABNORMAL LOW (ref 0.26–1.65)
Lambda free light chains: 124.3 mg/L — ABNORMAL HIGH (ref 5.7–26.3)

## 2022-02-10 LAB — MULTIPLE MYELOMA PANEL, SERUM
Albumin SerPl Elph-Mcnc: 3.2 g/dL (ref 2.9–4.4)
Albumin/Glob SerPl: 1.1 (ref 0.7–1.7)
Alpha 1: 0.2 g/dL (ref 0.0–0.4)
Alpha2 Glob SerPl Elph-Mcnc: 0.5 g/dL (ref 0.4–1.0)
B-Globulin SerPl Elph-Mcnc: 0.7 g/dL (ref 0.7–1.3)
Gamma Glob SerPl Elph-Mcnc: 1.7 g/dL (ref 0.4–1.8)
Globulin, Total: 3.1 g/dL (ref 2.2–3.9)
IgA: 29 mg/dL — ABNORMAL LOW (ref 61–437)
IgG (Immunoglobin G), Serum: 2136 mg/dL — ABNORMAL HIGH (ref 603–1613)
IgM (Immunoglobulin M), Srm: <5 mg/dL — ABNORMAL LOW (ref 20–172)
M Protein SerPl Elph-Mcnc: 1.6 g/dL — ABNORMAL HIGH
Total Protein ELP: 6.3 g/dL (ref 6.0–8.5)

## 2022-02-12 DIAGNOSIS — N401 Enlarged prostate with lower urinary tract symptoms: Secondary | ICD-10-CM | POA: Insufficient documentation

## 2022-02-13 ENCOUNTER — Inpatient Hospital Stay (HOSPITAL_BASED_OUTPATIENT_CLINIC_OR_DEPARTMENT_OTHER): Payer: Medicare Other | Admitting: Oncology

## 2022-02-13 ENCOUNTER — Inpatient Hospital Stay: Payer: Medicare Other

## 2022-02-13 ENCOUNTER — Encounter: Payer: Self-pay | Admitting: Oncology

## 2022-02-13 VITALS — BP 147/92 | HR 75 | Resp 16 | Wt 152.4 lb

## 2022-02-13 DIAGNOSIS — C9000 Multiple myeloma not having achieved remission: Secondary | ICD-10-CM

## 2022-02-13 DIAGNOSIS — Z79899 Other long term (current) drug therapy: Secondary | ICD-10-CM | POA: Diagnosis not present

## 2022-02-13 DIAGNOSIS — D649 Anemia, unspecified: Secondary | ICD-10-CM

## 2022-02-13 DIAGNOSIS — Z5111 Encounter for antineoplastic chemotherapy: Secondary | ICD-10-CM | POA: Diagnosis not present

## 2022-02-13 LAB — CBC WITH DIFFERENTIAL/PLATELET
Abs Immature Granulocytes: 0.01 10*3/uL (ref 0.00–0.07)
Basophils Absolute: 0 10*3/uL (ref 0.0–0.1)
Basophils Relative: 1 %
Eosinophils Absolute: 0.2 10*3/uL (ref 0.0–0.5)
Eosinophils Relative: 6 %
HCT: 29.8 % — ABNORMAL LOW (ref 39.0–52.0)
Hemoglobin: 9.5 g/dL — ABNORMAL LOW (ref 13.0–17.0)
Immature Granulocytes: 0 %
Lymphocytes Relative: 50 %
Lymphs Abs: 1.6 10*3/uL (ref 0.7–4.0)
MCH: 30.5 pg (ref 26.0–34.0)
MCHC: 31.9 g/dL (ref 30.0–36.0)
MCV: 95.8 fL (ref 80.0–100.0)
Monocytes Absolute: 0.5 10*3/uL (ref 0.1–1.0)
Monocytes Relative: 15 %
Neutro Abs: 0.9 10*3/uL — ABNORMAL LOW (ref 1.7–7.7)
Neutrophils Relative %: 28 %
Platelets: 251 10*3/uL (ref 150–400)
RBC: 3.11 MIL/uL — ABNORMAL LOW (ref 4.22–5.81)
RDW: 16.2 % — ABNORMAL HIGH (ref 11.5–15.5)
WBC: 3.1 10*3/uL — ABNORMAL LOW (ref 4.0–10.5)
nRBC: 0 % (ref 0.0–0.2)

## 2022-02-13 LAB — COMPREHENSIVE METABOLIC PANEL
ALT: 116 U/L — ABNORMAL HIGH (ref 0–44)
AST: 107 U/L — ABNORMAL HIGH (ref 15–41)
Albumin: 3.5 g/dL (ref 3.5–5.0)
Alkaline Phosphatase: 64 U/L (ref 38–126)
Anion gap: 5 (ref 5–15)
BUN: 33 mg/dL — ABNORMAL HIGH (ref 8–23)
CO2: 25 mmol/L (ref 22–32)
Calcium: 8.5 mg/dL — ABNORMAL LOW (ref 8.9–10.3)
Chloride: 106 mmol/L (ref 98–111)
Creatinine, Ser: 1.74 mg/dL — ABNORMAL HIGH (ref 0.61–1.24)
GFR, Estimated: 42 mL/min — ABNORMAL LOW (ref >60)
Glucose, Bld: 104 mg/dL — ABNORMAL HIGH (ref 70–99)
Potassium: 4.7 mmol/L (ref 3.5–5.1)
Sodium: 136 mmol/L (ref 135–145)
Total Bilirubin: 0.4 mg/dL (ref 0.3–1.2)
Total Protein: 7.4 g/dL (ref 6.5–8.1)

## 2022-02-13 MED ORDER — BORTEZOMIB CHEMO SQ INJECTION 3.5 MG (2.5MG/ML)
1.3000 mg/m2 | Freq: Once | INTRAMUSCULAR | Status: AC
  Administered 2022-02-13: 2.5 mg via SUBCUTANEOUS
  Filled 2022-02-13: qty 1

## 2022-02-13 MED ORDER — DEXAMETHASONE 4 MG PO TABS
40.0000 mg | ORAL_TABLET | Freq: Once | ORAL | Status: AC
  Administered 2022-02-13: 40 mg via ORAL
  Filled 2022-02-13: qty 10

## 2022-02-13 NOTE — Progress Notes (Signed)
Pt has recently noticed some balance issues. As well as, sates at times he goes a whole week without having a BM when he finally does have one; it is very hard for pt to push out.

## 2022-02-13 NOTE — Patient Instructions (Signed)
[facility]  Discharge Instructions: Thank you for choosing [facility] to provide your oncology and hematology care.  If you have a lab appointment with the Cancer Center, please go directly to the Cancer Center and check in at the registration area.  Wear comfortable clothing and clothing appropriate for easy access to any Portacath or PICC line.   We strive to give you quality time with your provider. You may need to reschedule your appointment if you arrive late (15 or more minutes).  Arriving late affects you and other patients whose appointments are after yours.  Also, if you miss three or more appointments without notifying the office, you may be dismissed from the clinic at the provider's discretion.      For prescription refill requests, have your pharmacy contact our office and allow 72 hours for refills to be completed.    Today you received the following chemotherapy and/or immunotherapy agents Velcade      To help prevent nausea and vomiting after your treatment, we encourage you to take your nausea medication as directed.  BELOW ARE SYMPTOMS THAT SHOULD BE REPORTED IMMEDIATELY: *FEVER GREATER THAN 100.4 F (38 C) OR HIGHER *CHILLS OR SWEATING *NAUSEA AND VOMITING THAT IS NOT CONTROLLED WITH YOUR NAUSEA MEDICATION *UNUSUAL SHORTNESS OF BREATH *UNUSUAL BRUISING OR BLEEDING *URINARY PROBLEMS (pain or burning when urinating, or frequent urination) *BOWEL PROBLEMS (unusual diarrhea, constipation, pain near the anus) TENDERNESS IN MOUTH AND THROAT WITH OR WITHOUT PRESENCE OF ULCERS (sore throat, sores in mouth, or a toothache) UNUSUAL RASH, SWELLING OR PAIN  UNUSUAL VAGINAL DISCHARGE OR ITCHING   Items with * indicate a potential emergency and should be followed up as soon as possible or go to the Emergency Department if any problems should occur.  Please show the CHEMOTHERAPY ALERT CARD or IMMUNOTHERAPY ALERT CARD at check-in to the  Emergency Department and triage nurse.  Should you have questions after your visit or need to cancel or reschedule your appointment, please contact [facility]  [phone] and follow the prompts.  Office hours are 8:00 a.m. to 4:30 p.m. Monday - Friday. Please note that voicemails left after 4:00 p.m. may not be returned until the following business day.  We are closed weekends and major holidays. You have access to a nurse at all times for urgent questions. Please call the main number to the clinic [phone] and follow the prompts.  For any non-urgent questions, you may also contact your provider using MyChart. We now offer e-Visits for anyone 18 and older to request care online for non-urgent symptoms. For details visit [email or web address].   Also download the MyChart app! Go to the app store, search "MyChart", open the app, select [facility], and log in with your MyChart username and password.  Masks are optional in the cancer centers. If you would like for your care team to wear a mask while they are taking care of you, please let them know. For doctor visits, patients may have with them one support person who is at least 69 years old. At this time, visitors are not allowed in the infusion area.   

## 2022-02-13 NOTE — Progress Notes (Signed)
Hematology/Oncology Consult note [facility]  [phone] [phone]  Patient Care Team: Dion Body, MD as PCP - General (Family Medicine) Pa, Finley (Optometry) Sindy Guadeloupe, MD as Consulting Physician (Oncology)   Name of the patient: Nicholas Weiss  [id number]  [birth year]   Date of visit: 02/13/22  Diagnosis- high risk IgG lambda multiple myeloma R-ISS stage III     Chief complaint/ Reason for visit-on treatment assessment prior to cycle 6-day 1 of Velcade  Heme/Onc history: Patient is a 71 year old African-American male with a past medical history significant for uncontrolled type 1 diabetes, chronic kidney disease stage III chronic normocytic anemia referred for abnormal SPEP. Patient's creatinine has been fluctuating between 1.5-2 but about 5 months ago it went up all the way to 5 and his blood sugars were in the 1000 range. More recently his kidney numbers have been drifting back to normal values. As a part of the work-up he had serum protein electrophoresis done which showed an elevated gammaglobulin fraction with an M spike of 3%. The amount of M spike was not quantified in the specimen. He has therefore been referred to [place] for further management. Patient endorses chronic fatigue reports that his appetite and weight have remained stable   Results of blood work from 04/08/2020 were as follows: CBC showed white count of 2.9, H&H of 10.5/31.2 with an MCV of 91 and a platelet count of 191.  CMP showed a mildly elevated creatinine of 1.2 which was better as compared to 5 months ago when it was 3.9.  Total protein was mildly elevated at 9.1 calcium normal at 8.4 ferritin and iron studies B12 folate TSH and haptoglobin were normal.  Myeloma panel revealed an elevated IgG level of 06/04/2004 with an M protein of 3.1 g.  Immunofixation showed IgG lambda specificity.  Serum free light chain ratio was elevated at 25 and free light  chain lambda elevated at 370   Further myeloma work-up including a PET CT scan did not reveal any evidence of edematous lesions.  Bone marrow biopsy showed 23% plasma cells by manual count and 30% by CD138 IHC.  Normal cytogenetics.  FISH studies for myeloma showed gain of 1 q.  13 q-. detected.  P53 not detected.   He was treated for a year and has having smoldering multiple myeloma since both CKD and anemia have been stable for 3 to 4 years and could be secondary to uncontrolled diabetes.In June 2023 IgG levels increased to 5463 with an M protein of 4.1 g as compared to 2.6 g in September 2022.  Serum free light chain ratio remains around 25.  Therefore a repeat bone marrow biopsy was done which shows further increase in plasma cells ranging from 30 to 70% and by immunohistochemistry 60 to 70% of the cells were positive for CD138.  Cytogenetics normal.  FISH study showed 4: 14 translocation and gain of 1 q. making this high risk RISS stage III.  Repeat PET scan showed no lytic lesions    Interval history-patient reports being compliant with his acyclovir.  He will be starting his next dose of Revlimid today.  Denies any significant side effects at this time  ECOG PS- 1 Pain scale- 0   Review of systems- Review of Systems  Constitutional:  Positive for malaise/fatigue. Negative for chills, fever and weight loss.  HENT:  Negative for congestion, ear discharge and nosebleeds.   Eyes:  Negative for blurred vision.  Respiratory:  Negative for cough, hemoptysis, sputum production, shortness of breath and wheezing.   Cardiovascular:  Negative for chest pain, palpitations, orthopnea and claudication.  Gastrointestinal:  Negative for abdominal pain, blood in stool, constipation, diarrhea, heartburn, melena, nausea and vomiting.  Genitourinary:  Negative for dysuria, flank pain, frequency, hematuria and urgency.  Musculoskeletal:  Negative for back pain, joint pain and myalgias.  Skin:  Negative for  rash.  Neurological:  Negative for dizziness, tingling, focal weakness, seizures, weakness and headaches.  Endo/Heme/Allergies:  Does not bruise/bleed easily.  Psychiatric/Behavioral:  Negative for depression and suicidal ideas. The patient does not have insomnia.       Allergies  Allergen Reactions   Penicillins Other (See Comments)    Has patient had a PCN reaction causing immediate rash, facial/tongue/throat swelling, SOB or lightheadedness with hypotension: No Has patient had a PCN reaction causing severe rash involving mucus membranes or skin necrosis: No Has patient had a PCN reaction that required hospitalization No Has patient had a PCN reaction occurring within the last 10 years: No If all of the above answers are "NO", then may proceed with Cephalosporin use.  Other reaction(s): Other (see comments) Other reaction(s): Other (See Comments) Has patient had a PCN reaction causing immediate rash, facial/tongue/throat swelling, SOB or lightheadedness with hypotension: No Has patient had a PCN reaction causing severe rash involving mucus membranes or skin necrosis: No Has patient had a PCN reaction that required hospitalization No Has patient had a PCN reaction occurring within the last 10 years: No If all of the above answers are "NO", then may proceed with Cephalosporin use. Has patient had a PCN reaction causing immediate rash, facial/tongue/throat swelling, SOB or lightheadedness with hypotension: No Has patient had a PCN reaction causing severe rash involving mucus membranes or skin necrosis: No Has patient had a PCN reaction that required hospitalization No Has patient had a PCN reaction occurring within the last 10 years: No If all of the above answers are "NO", then may proceed with Cephalosporin use.     Past Medical History:  Diagnosis Date   DKA (diabetic ketoacidoses) 04/06/2016   Hypercholesteremia    Hypertension      Past Surgical History:  Procedure  Laterality Date   COLONOSCOPY WITH PROPOFOL N/A 02/01/2020   Procedure: COLONOSCOPY WITH PROPOFOL;  Surgeon: Lin Landsman, MD;  Location: [facility] ENDOSCOPY;  Service: Gastroenterology;  Laterality: N/A;   ESOPHAGOGASTRODUODENOSCOPY  02/01/2020   Procedure: ESOPHAGOGASTRODUODENOSCOPY (EGD);  Surgeon: Lin Landsman, MD;  Location: [facility] ENDOSCOPY;  Service: Gastroenterology;;   KNEE SURGERY Right    Torn meniscus   KNEE SURGERY Left     Social History   Socioeconomic History   Marital status: Single    Spouse name: Not on file   Number of children: 3   Years of education: Not on file   Highest education level: High school graduate  Occupational History    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  Tobacco Use   Smoking status: Never   Smokeless tobacco: Never  Vaping Use   Vaping Use: Never used  Substance and Sexual Activity   Alcohol use: Not Currently    Alcohol/week: 0.0 - 1.0 standard drinks of alcohol    Comment: "once every 2 months"   Drug use: Yes    Types: Marijuana, "Crack" cocaine    Comment: last week   Sexual activity: Yes    Birth control/protection: None  Other Topics Concern   Not on file  Social History Narrative  Lives with girlfriend "sharon"   Social Determinants of Health   Financial Resource Strain: Low Risk  (01/01/2020)   Overall Financial Resource Strain (CARDIA)    Difficulty of Paying Living Expenses: Not hard at all  Food Insecurity: No Food Insecurity (12/07/2021)   Hunger Vital Sign    Worried About Running Out of Food in the Last Year: Never true    Ran Out of Food in the Last Year: Never true  Transportation Needs: No Transportation Needs (12/07/2021)   PRAPARE - Hydrologist (Medical): No    Lack of Transportation (Non-Medical): No  Physical Activity: Insufficiently Active (01/01/2020)   Exercise Vital Sign    Days of Exercise per Week: 7 days    Minutes of Exercise per Session: 20 min  Stress: No Stress  Concern Present (01/01/2020)   [place]    Feeling of Stress : Not at all  Social Connections: Moderately Isolated (01/01/2020)   Social Connection and Isolation Panel [NHANES]    Frequency of Communication with Friends and Family: More than three times a week    Frequency of Social Gatherings with Friends and Family: More than three times a week    Attends Religious Services: Never    Marine scientist or Organizations: No    Attends Archivist Meetings: Never    Marital Status: Living with partner  Intimate Partner Violence: Not At Risk (12/07/2021)   Humiliation, Afraid, Rape, and Kick questionnaire    Fear of Current or Ex-Partner: No    Emotionally Abused: No    Physically Abused: No    Sexually Abused: No    Family History  Problem Relation Age of Onset   Heart attack Father    Hypertension Sister    Cancer Sister      Current Outpatient Medications:    Alcohol Swabs (B-D SINGLE USE SWABS REGULAR) PADS, Use to check blood sugar four times daily for type 1 diabetes, Disp: 300 each, Rfl: 4   amLODipine (NORVASC) 10 MG tablet, Take 1 tablet (10 mg total) by mouth daily., Disp: 30 tablet, Rfl: 2   ASPIRIN 81 PO, Take 81 mg by mouth daily., Disp: , Rfl:    atorvastatin (LIPITOR) 80 MG tablet, Take 1 tablet (80 mg total) by mouth at bedtime., Disp: 30 tablet, Rfl: 2   Blood Glucose Calibration (TRUE METRIX LEVEL 1) Low SOLN, Use to check blood sugar four times daily for type 1 diabetes, Disp: 1 each, Rfl: 2   clopidogrel (PLAVIX) 75 MG tablet, Take by mouth., Disp: , Rfl:    Docusate Sodium (DSS) 100 MG CAPS, Take by mouth., Disp: , Rfl:    FARXIGA 10 MG TABS tablet, Take 10 mg by mouth daily., Disp: , Rfl:    gabapentin (NEURONTIN) 300 MG capsule, Take 300 mg by mouth in the morning and 600 mg at night., Disp: , Rfl:    insulin degludec (TRESIBA FLEXTOUCH) 100 UNIT/ML FlexTouch Pen, Inject 20 Units  into the skin daily. Home dose., Disp: , Rfl:    Insulin Syringe-Needle U-100 (INSULIN SYRINGE 1CC/31GX5/16") 31G X 5/16" 1 ML MISC, For insulin injections up to 4 times daily, Disp: 270 each, Rfl: 4   lenalidomide (REVLIMID) 10 MG capsule, TAKE 1 CAPSULE BY MOUTH DAILY FOR 14 DAYS, THEN HOLD FOR 7 DAYS. REPEAT EVERY 21 DAYS, Disp: 14 capsule, Rfl: 0   lisinopril (ZESTRIL) 10 MG tablet, Take 1 tablet (10  mg total) by mouth daily., Disp: 30 tablet, Rfl: 2   acyclovir (ZOVIRAX) 400 MG tablet, Take 1 tablet (400 mg total) by mouth 2 (two) times daily. (Patient not taking: Reported on 02/13/2022), Disp: 60 tablet, Rfl: 3   baclofen 5 MG TABS, Take 5 mg by mouth 3 (three) times daily as needed for muscle spasms. (Patient not taking: Reported on 02/13/2022), Disp: 60 tablet, Rfl: 1   dicyclomine (BENTYL) 20 MG tablet, Take 1 tablet (20 mg total) by mouth 3 (three) times daily with meals as needed for spasms. (Patient not taking: Reported on 02/06/2022), Disp: 90 tablet, Rfl: 0   NOVOLOG 100 UNIT/ML injection, INJECT UP TO 14 UNITS THREE TIMES DAILY BEFORE MEALS ACCORDING TO SLIDING SCALE (Patient not taking: Reported on 02/13/2022), Disp: 10 mL, Rfl: 1   ondansetron (ZOFRAN) 8 MG tablet, Take 8 mg by mouth 2 (two) times daily as needed. (Patient not taking: Reported on 02/06/2022), Disp: , Rfl:    tamsulosin (FLOMAX) 0.4 MG CAPS capsule, Take by mouth. (Patient not taking: Reported on 02/13/2022), Disp: , Rfl:   Physical exam:  Vitals:   02/13/22 1006  BP: (!) 147/92  Pulse: 75  Resp: 16  SpO2: 98%  Weight: 152 lb 6.4 oz (69.1 kg)   Physical Exam Constitutional:      General: He is not in acute distress. Cardiovascular:     Rate and Rhythm: Normal rate and regular rhythm.     Heart sounds: Normal heart sounds.  Pulmonary:     Effort: Pulmonary effort is normal.     Breath sounds: Normal breath sounds.  Abdominal:     General: Bowel sounds are normal.     Palpations: Abdomen is soft.   Skin:    General: Skin is warm and dry.  Neurological:     Mental Status: He is alert and oriented to person, place, and time.         Latest Ref Rng & Units 02/13/2022    9:29 AM  CMP  Glucose 70 - 99 mg/dL 104   BUN 8 - 23 mg/dL 33   Creatinine 0.61 - 1.24 mg/dL 1.74   Sodium 135 - 145 mmol/L 136   Potassium 3.5 - 5.1 mmol/L 4.7   Chloride 98 - 111 mmol/L 106   CO2 22 - 32 mmol/L 25   Calcium 8.9 - 10.3 mg/dL 8.5   Total Protein 6.5 - 8.1 g/dL 7.4   Total Bilirubin 0.3 - 1.2 mg/dL 0.4   Alkaline Phos 38 - 126 U/L 64   AST 15 - 41 U/L 107   ALT 0 - 44 U/L 116       Latest Ref Rng & Units 02/13/2022    9:29 AM  CBC  WBC 4.0 - 10.5 K/uL 3.1   Hemoglobin 13.0 - 17.0 g/dL 9.5   Hematocrit 39.0 - 52.0 % 29.8   Platelets 150 - 400 K/uL 251      Assessment and plan- Patient is a 71 y.o. male  male with R-ISS stage III high risk IgG lambda multiple myeloma.  He is here for on treatment assessment prior to cycle 6-day 1 of Velcade  Counts okay to proceed with cycle 6-day 1 of Velcade today.  He will be missing day 8 next week as our cancer center is closed on Friday.  He will directly come in 2 weeks for cycle 6-day 15 of Velcade.  He has started his Revlimid today and will take it 2 weeks on  and 1 week off 10 mg.  Continue low-dose aspirin along with acyclovir prophylaxis.   To start the patient on prophylactic Zometa today every 3 months.  He did not have any baseline bone lesions.  However it is serum calcium is borderline at 8.5 and given his CKD I will hold off on giving it to him today.  If his calcium levels are consistently greater than 8.5 we will introduce Zometa every 3 months.  Anemia of chronic kidney disease: Hemoglobin remained stable around 9-10.  He will receive his Retacrit again in 2 weeks   Visit Diagnosis 1. Encounter for antineoplastic chemotherapy   2. Multiple myeloma not having achieved remission ([place])   3. High risk medication use      Dr.  Randa Evens, MD, MPH [facility] [phone] 02/13/2022 12:52 PM

## 2022-02-13 NOTE — Progress Notes (Signed)
Nutrition Assessment   Reason for Assessment:   Patient identified on Malnutrition Screening report for weight loss   ASSESSMENT:  71 year old male with multiple myeloma.  Past medical history of substance abuse, DKA, DM, HTN, CKD stage III, CVA.  Patient receiving velcade and revlimid.    Met with patient during infusion.  Patient reports good appetite.  Has been trying to avoid fast foods and eating more foods from "the ground."  I am trying to eat more baked foods and stay away from the fried foods.  Drinking mostly water.  Denies nutrition impact symptoms from treatment.     Medications: reviewed   Labs: reviewed   Anthropometrics:   Height: 70 inches Weight: 152 lb 6.4 oz 166 lb on 10/20 but had swelling 148-160s BMI: 21    NUTRITION DIAGNOSIS: none at this time    INTERVENTION:  Encouraged well balanced diet including lean protein foods and plant foods. Encouraged sugar free beverages Contact information provided   Next Visit: no follow-up   Savaya Hakes B. Zenia Resides, [place], [place] Registered Dietitian [phone]

## 2022-02-17 ENCOUNTER — Emergency Department: Payer: Medicare Other

## 2022-02-17 ENCOUNTER — Other Ambulatory Visit: Payer: Self-pay

## 2022-02-17 ENCOUNTER — Other Ambulatory Visit: Payer: Medicare Other

## 2022-02-17 ENCOUNTER — Encounter (HOSPITAL_COMMUNITY): Payer: Self-pay

## 2022-02-17 ENCOUNTER — Emergency Department
Admission: EM | Admit: 2022-02-17 | Discharge: 2022-02-17 | Disposition: A | Discharge status: Other Acute Inpatient Hospital | Payer: Medicare Other | Attending: Student in an Organized Health Care Education/Training Program | Admitting: Student in an Organized Health Care Education/Training Program

## 2022-02-17 ENCOUNTER — Encounter: Payer: Self-pay | Admitting: Emergency Medicine

## 2022-02-17 ENCOUNTER — Inpatient Hospital Stay (HOSPITAL_COMMUNITY)
Admission: EM | Admit: 2022-02-17 | Discharge: 2022-02-25 | DRG: 683 | Disposition: A | Discharge status: Home Health | Payer: Medicare Other | Attending: Internal Medicine | Admitting: Internal Medicine

## 2022-02-17 DIAGNOSIS — E1065 Type 1 diabetes mellitus with hyperglycemia: Secondary | ICD-10-CM | POA: Diagnosis not present

## 2022-02-17 DIAGNOSIS — Y92009 Unspecified place in unspecified non-institutional (private) residence as the place of occurrence of the external cause: Secondary | ICD-10-CM | POA: Insufficient documentation

## 2022-02-17 DIAGNOSIS — E7800 Pure hypercholesterolemia, unspecified: Secondary | ICD-10-CM | POA: Diagnosis not present

## 2022-02-17 DIAGNOSIS — Z79899 Other long term (current) drug therapy: Secondary | ICD-10-CM

## 2022-02-17 DIAGNOSIS — Z8673 Personal history of transient ischemic attack (TIA), and cerebral infarction without residual deficits: Secondary | ICD-10-CM | POA: Diagnosis not present

## 2022-02-17 DIAGNOSIS — W010XXA Fall on same level from slipping, tripping and stumbling without subsequent striking against object, initial encounter: Secondary | ICD-10-CM | POA: Diagnosis not present

## 2022-02-17 DIAGNOSIS — Z7902 Long term (current) use of antithrombotics/antiplatelets: Secondary | ICD-10-CM | POA: Diagnosis not present

## 2022-02-17 DIAGNOSIS — N1831 Chronic kidney disease, stage 3a: Secondary | ICD-10-CM | POA: Diagnosis present

## 2022-02-17 DIAGNOSIS — S0083XA Contusion of other part of head, initial encounter: Secondary | ICD-10-CM | POA: Diagnosis not present

## 2022-02-17 DIAGNOSIS — E1022 Type 1 diabetes mellitus with diabetic chronic kidney disease: Secondary | ICD-10-CM | POA: Diagnosis not present

## 2022-02-17 DIAGNOSIS — R296 Repeated falls: Secondary | ICD-10-CM | POA: Diagnosis not present

## 2022-02-17 DIAGNOSIS — Z880 Allergy status to penicillin: Secondary | ICD-10-CM | POA: Diagnosis not present

## 2022-02-17 DIAGNOSIS — N183 Chronic kidney disease, stage 3 unspecified: Secondary | ICD-10-CM | POA: Diagnosis present

## 2022-02-17 DIAGNOSIS — E162 Hypoglycemia, unspecified: Secondary | ICD-10-CM | POA: Diagnosis not present

## 2022-02-17 DIAGNOSIS — N179 Acute kidney failure, unspecified: Secondary | ICD-10-CM

## 2022-02-17 DIAGNOSIS — R55 Syncope and collapse: Secondary | ICD-10-CM | POA: Diagnosis present

## 2022-02-17 DIAGNOSIS — R7309 Other abnormal glucose: Secondary | ICD-10-CM | POA: Diagnosis not present

## 2022-02-17 DIAGNOSIS — Z794 Long term (current) use of insulin: Secondary | ICD-10-CM | POA: Diagnosis not present

## 2022-02-17 DIAGNOSIS — M549 Dorsalgia, unspecified: Secondary | ICD-10-CM | POA: Diagnosis not present

## 2022-02-17 DIAGNOSIS — S2242XA Multiple fractures of ribs, left side, initial encounter for closed fracture: Secondary | ICD-10-CM | POA: Diagnosis present

## 2022-02-17 DIAGNOSIS — S270XXA Traumatic pneumothorax, initial encounter: Secondary | ICD-10-CM | POA: Insufficient documentation

## 2022-02-17 DIAGNOSIS — Z8711 Personal history of peptic ulcer disease: Secondary | ICD-10-CM

## 2022-02-17 DIAGNOSIS — E875 Hyperkalemia: Secondary | ICD-10-CM | POA: Diagnosis present

## 2022-02-17 DIAGNOSIS — I129 Hypertensive chronic kidney disease with stage 1 through stage 4 chronic kidney disease, or unspecified chronic kidney disease: Secondary | ICD-10-CM | POA: Diagnosis present

## 2022-02-17 DIAGNOSIS — E10649 Type 1 diabetes mellitus with hypoglycemia without coma: Secondary | ICD-10-CM | POA: Diagnosis present

## 2022-02-17 DIAGNOSIS — Z7982 Long term (current) use of aspirin: Secondary | ICD-10-CM | POA: Diagnosis not present

## 2022-02-17 DIAGNOSIS — Z8249 Family history of ischemic heart disease and other diseases of the circulatory system: Secondary | ICD-10-CM

## 2022-02-17 DIAGNOSIS — S299XXA Unspecified injury of thorax, initial encounter: Secondary | ICD-10-CM | POA: Diagnosis present

## 2022-02-17 DIAGNOSIS — F1410 Cocaine abuse, uncomplicated: Secondary | ICD-10-CM | POA: Diagnosis present

## 2022-02-17 DIAGNOSIS — C9000 Multiple myeloma not having achieved remission: Secondary | ICD-10-CM | POA: Diagnosis present

## 2022-02-17 DIAGNOSIS — E1040 Type 1 diabetes mellitus with diabetic neuropathy, unspecified: Secondary | ICD-10-CM | POA: Diagnosis not present

## 2022-02-17 DIAGNOSIS — I10 Essential (primary) hypertension: Secondary | ICD-10-CM | POA: Insufficient documentation

## 2022-02-17 DIAGNOSIS — S0012XA Contusion of left eyelid and periocular area, initial encounter: Secondary | ICD-10-CM | POA: Diagnosis not present

## 2022-02-17 DIAGNOSIS — Z8579 Personal history of other malignant neoplasms of lymphoid, hematopoietic and related tissues: Secondary | ICD-10-CM | POA: Diagnosis not present

## 2022-02-17 DIAGNOSIS — W1839XA Other fall on same level, initial encounter: Secondary | ICD-10-CM | POA: Diagnosis not present

## 2022-02-17 LAB — URINE DRUG SCREEN, QUALITATIVE (ARMC ONLY)
Amphetamines, Ur Screen: NOT DETECTED
Barbiturates, Ur Screen: NOT DETECTED
Benzodiazepine, Ur Scrn: NOT DETECTED
Cannabinoid 50 Ng, Ur ~~LOC~~: POSITIVE — AB
Cocaine Metabolite,Ur ~~LOC~~: POSITIVE — AB
MDMA (Ecstasy)Ur Screen: NOT DETECTED
Methadone Scn, Ur: NOT DETECTED
Opiate, Ur Screen: NOT DETECTED
Phencyclidine (PCP) Ur S: NOT DETECTED
Tricyclic, Ur Screen: NOT DETECTED

## 2022-02-17 LAB — CBC WITH DIFFERENTIAL/PLATELET
Abs Immature Granulocytes: 0.02 10*3/uL (ref 0.00–0.07)
Basophils Absolute: 0 10*3/uL (ref 0.0–0.1)
Basophils Relative: 0 %
Eosinophils Absolute: 0.1 10*3/uL (ref 0.0–0.5)
Eosinophils Relative: 1 %
HCT: 30.9 % — ABNORMAL LOW (ref 39.0–52.0)
Hemoglobin: 10 g/dL — ABNORMAL LOW (ref 13.0–17.0)
Immature Granulocytes: 1 %
Lymphocytes Relative: 20 %
Lymphs Abs: 0.9 10*3/uL (ref 0.7–4.0)
MCH: 30.2 pg (ref 26.0–34.0)
MCHC: 32.4 g/dL (ref 30.0–36.0)
MCV: 93.4 fL (ref 80.0–100.0)
Monocytes Absolute: 0.5 10*3/uL (ref 0.1–1.0)
Monocytes Relative: 11 %
Neutro Abs: 2.8 10*3/uL (ref 1.7–7.7)
Neutrophils Relative %: 67 %
Platelets: 269 10*3/uL (ref 150–400)
RBC: 3.31 MIL/uL — ABNORMAL LOW (ref 4.22–5.81)
RDW: 16.6 % — ABNORMAL HIGH (ref 11.5–15.5)
WBC: 4.3 10*3/uL (ref 4.0–10.5)
nRBC: 0 % (ref 0.0–0.2)

## 2022-02-17 LAB — BASIC METABOLIC PANEL
Anion gap: 10 (ref 5–15)
BUN: 96 mg/dL — ABNORMAL HIGH (ref 8–23)
CO2: 23 mmol/L (ref 22–32)
Calcium: 8.7 mg/dL — ABNORMAL LOW (ref 8.9–10.3)
Chloride: 107 mmol/L (ref 98–111)
Creatinine, Ser: 4.98 mg/dL — ABNORMAL HIGH (ref 0.61–1.24)
GFR, Estimated: 12 mL/min — ABNORMAL LOW (ref >60)
Glucose, Bld: 62 mg/dL — ABNORMAL LOW (ref 70–99)
Potassium: 5.2 mmol/L — ABNORMAL HIGH (ref 3.5–5.1)
Sodium: 140 mmol/L (ref 135–145)

## 2022-02-17 LAB — TROPONIN I (HIGH SENSITIVITY)
Troponin I (High Sensitivity): 22 ng/L — ABNORMAL HIGH (ref <18)
Troponin I (High Sensitivity): 21 ng/L — ABNORMAL HIGH (ref <18)

## 2022-02-17 LAB — URINALYSIS, ROUTINE W REFLEX MICROSCOPIC
Bacteria, UA: NONE SEEN
Bilirubin Urine: NEGATIVE
Glucose, UA: >=500 mg/dL — AB
Ketones, ur: NEGATIVE mg/dL
Nitrite: NEGATIVE
Protein, ur: 100 mg/dL — AB
Specific Gravity, Urine: 1.015 (ref 1.005–1.030)
WBC, UA: >50 WBC/hpf — ABNORMAL HIGH (ref 0–5)
pH: 7 (ref 5.0–8.0)

## 2022-02-17 LAB — CBG MONITORING, ED
Glucose-Capillary: 33 mg/dL — CL (ref 70–99)
Glucose-Capillary: 113 mg/dL — ABNORMAL HIGH (ref 70–99)
Glucose-Capillary: 158 mg/dL — ABNORMAL HIGH (ref 70–99)
Glucose-Capillary: 28 mg/dL — CL (ref 70–99)
Glucose-Capillary: 28 mg/dL — CL (ref 70–99)

## 2022-02-17 LAB — TYPE AND SCREEN
ABO/RH(D): A POS
Antibody Screen: NEGATIVE

## 2022-02-17 LAB — PROTIME-INR
INR: 1.1 (ref 0.8–1.2)
Prothrombin Time: 13.9 seconds (ref 11.4–15.2)

## 2022-02-17 LAB — HEPATIC FUNCTION PANEL
ALT: 62 U/L — ABNORMAL HIGH (ref 0–44)
AST: 35 U/L (ref 15–41)
Albumin: 3.9 g/dL (ref 3.5–5.0)
Alkaline Phosphatase: 62 U/L (ref 38–126)
Bilirubin, Direct: <0.1 mg/dL (ref 0.0–0.2)
Total Bilirubin: 0.8 mg/dL (ref 0.3–1.2)
Total Protein: 8.2 g/dL — ABNORMAL HIGH (ref 6.5–8.1)

## 2022-02-17 LAB — APTT: aPTT: 26 seconds (ref 24–36)

## 2022-02-17 MED ORDER — HYDROMORPHONE HCL 1 MG/ML IJ SOLN
0.5000 mg | Freq: Once | INTRAMUSCULAR | Status: AC
  Administered 2022-02-17: 0.5 mg via INTRAVENOUS

## 2022-02-17 MED ORDER — SODIUM CHLORIDE 0.9 % IV BOLUS
1000.0000 mL | Freq: Once | INTRAVENOUS | Status: AC
  Administered 2022-02-17: 1000 mL via INTRAVENOUS

## 2022-02-17 MED ORDER — DEXTROSE 50 % IV SOLN
INTRAVENOUS | Status: AC
  Filled 2022-02-17: qty 50

## 2022-02-17 MED ORDER — DEXTROSE 50 % IV SOLN
25.0000 g | Freq: Once | INTRAVENOUS | Status: AC
  Administered 2022-02-17: 25 g via INTRAVENOUS

## 2022-02-17 MED ORDER — DEXTROSE 50 % IV SOLN
25.0000 g | Freq: Once | INTRAVENOUS | Status: AC
  Administered 2022-02-17: 25 g via INTRAVENOUS
  Filled 2022-02-17: qty 50

## 2022-02-17 MED ORDER — DEXTROSE 10 % IV SOLN: INTRAVENOUS | Status: DC

## 2022-02-17 MED ORDER — IOHEXOL 300 MG/ML  SOLN: 100.0000 mL | Freq: Once | INTRAMUSCULAR | Status: DC | PRN

## 2022-02-17 MED ORDER — HYDROMORPHONE HCL 1 MG/ML IJ SOLN
INTRAMUSCULAR | Status: AC
  Filled 2022-02-17: qty 0.5

## 2022-02-17 NOTE — ED Notes (Signed)
Verbal order for 0.5 mg IV dilaudid per Dr. Starleen Blue

## 2022-02-17 NOTE — ED Notes (Signed)
EMTALA reviewed by this RN.  Transfer consent signed. ?

## 2022-02-17 NOTE — ED Triage Notes (Signed)
First Nurse Note;  Pt via EMS from home. Pt was getting out of bed and fell, pt walked to the kithcen and fell 3-4 more time. Pt c/o back pain and L sided rib pain. Pt has hematoma to L eyebrow. VSS. Pt is A&Ox4 and NAD. Pt was ambulatory on scene   166 CBG 120/63 BP  90 HR  100% on RA

## 2022-02-17 NOTE — ED Notes (Signed)
called to [facility] for transport to [place] to ed per MD mchugh /rep:tammy.

## 2022-02-17 NOTE — ED Notes (Signed)
Pt continues to to be restless, is redirectable, but continues to move freely on stretcher, tangling himself up in monitoring devices. Attempted to advised pt to remain calm, CBG has improved.

## 2022-02-17 NOTE — ED Notes (Signed)
[facility] has arrived for transport, refer to [facility] for dextrose orders

## 2022-02-17 NOTE — ED Notes (Signed)
Dr. Starleen Blue notified of pt's decrease CBG at this time, verbal orders given

## 2022-02-17 NOTE — ED Triage Notes (Signed)
Patient brought back into triage in wheelchair grunting stating his back and left side hurt. Patient unsure how he fell. States he was alone. Denies any blood thinners.

## 2022-02-17 NOTE — ED Provider Notes (Signed)
[place] EMERGENCY DEPARTMENT Provider Note   CSN: [id number] Arrival date & time: 02/17/22  2347     History {Add pertinent medical, surgical, social history, OB history to HPI:1} Chief Complaint  Patient presents with   Fall   Hypoglycemia    Nicholas Weiss is a 71 y.o. male.   Fall Associated symptoms include chest pain (ribs).  Hypoglycemia   71 y.o. M with hx of MM, dyslipidemia, cocaine abuse, dysphagic, first degree heart block, PUD, prior stroke, DM1, presenting to the ED from [facility].  Initially evaluated there [place] today for multiple falls--- unclear as to why but suspected due to EtOH and other substance abuse.  While at [facility] was found to he hypoglycemic, started on D10 drip, UDS + for cocaine and marijauana.  Also found to have left 8th and 9th rib fractures along with small PTX.  After consult with trauma team here, sent to [facility] for further evaluation.  On arrival he is AAOx3.  He does report pain in the left chest and pain with breathing.  He is able to speak in sentences and does not appear distressed.  Denies any new complaints.  Home Medications Prior to Admission medications   Medication Sig Start Date End Date Taking? Authorizing Provider  acyclovir (ZOVIRAX) 400 MG tablet Take 1 tablet (400 mg total) by mouth 2 (two) times daily. Patient not taking: Reported on 02/13/2022 11/21/21   Sindy Guadeloupe, MD  Alcohol Swabs (B-D SINGLE USE SWABS REGULAR) PADS Use to check blood sugar four times daily for type 1 diabetes 07/11/20   Birdie Sons, MD  amLODipine (NORVASC) 10 MG tablet Take 1 tablet (10 mg total) by mouth daily. 11/28/21 02/26/22  Enzo Bi, MD  ASPIRIN 81 PO Take 81 mg by mouth daily.    [provider]  atorvastatin (LIPITOR) 80 MG tablet Take 1 tablet (80 mg total) by mouth at bedtime. 08/11/21   Shawna Clamp, MD  baclofen 5 MG TABS Take 5 mg by mouth 3 (three) times daily as needed for muscle spasms. Patient not taking:  Reported on 02/13/2022 11/07/21   Ventura Sellers, MD  Blood Glucose Calibration (TRUE METRIX LEVEL 1) Low SOLN Use to check blood sugar four times daily for type 1 diabetes 07/11/20   Birdie Sons, MD  clopidogrel (PLAVIX) 75 MG tablet Take by mouth. 12/03/21   [provider]  dicyclomine (BENTYL) 20 MG tablet Take 1 tablet (20 mg total) by mouth 3 (three) times daily with meals as needed for spasms. Patient not taking: Reported on 02/06/2022 12/09/21 01/08/22  Sidney Ace, MD  Docusate Sodium (DSS) 100 MG CAPS Take by mouth.    [provider]  FARXIGA 10 MG TABS tablet Take 10 mg by mouth daily. 05/05/21   [provider]  gabapentin (NEURONTIN) 300 MG capsule Take 300 mg by mouth in the morning and 600 mg at night. 04/04/21   [provider]  insulin degludec (TRESIBA FLEXTOUCH) 100 UNIT/ML FlexTouch Pen Inject 20 Units into the skin daily. Home dose. 11/28/21   Enzo Bi, MD  Insulin Syringe-Needle U-100 (INSULIN SYRINGE 1CC/31GX5/16") 31G X 5/16" 1 ML MISC For insulin injections up to 4 times daily 07/11/20   Birdie Sons, MD  lenalidomide (REVLIMID) 10 MG capsule TAKE 1 CAPSULE BY MOUTH DAILY FOR 14 DAYS, THEN HOLD FOR 7 DAYS. REPEAT EVERY 21 DAYS 02/06/22   Sindy Guadeloupe, MD  lisinopril (ZESTRIL) 10 MG tablet Take  1 tablet (10 mg total) by mouth daily. 11/28/21 02/26/22  Enzo Bi, MD  NOVOLOG 100 UNIT/ML injection INJECT UP TO 14 UNITS THREE TIMES DAILY BEFORE MEALS ACCORDING TO SLIDING SCALE Patient not taking: Reported on 02/13/2022 09/09/21   Birdie Sons, MD  ondansetron (ZOFRAN) 8 MG tablet Take 8 mg by mouth 2 (two) times daily as needed. Patient not taking: Reported on 02/06/2022 01/01/22   [provider]  tamsulosin (FLOMAX) 0.4 MG CAPS capsule Take by mouth. Patient not taking: Reported on 02/13/2022 02/12/22   [provider]  prochlorperazine (COMPAZINE) 10 MG tablet Take 1 tablet (10 mg total) by mouth every 6 (six)  hours as needed (Nausea or vomiting). Patient not taking: Reported on 10/17/2021 09/29/21 11/21/21  Sindy Guadeloupe, MD      Allergies    Penicillins    Review of Systems   Review of Systems  Cardiovascular:  Positive for chest pain (ribs).  All other systems reviewed and are negative.   Physical Exam Updated Vital Signs Pulse 90   Temp 98.4 F (36.9 C) (Oral)   Resp 16   SpO2 97%   Physical Exam Vitals and nursing note reviewed.  Constitutional:      Appearance: He is well-developed.  HENT:     Head: Normocephalic and atraumatic.  Eyes:     Conjunctiva/sclera: Conjunctivae normal.     Pupils: Pupils are equal, round, and reactive to light.  Cardiovascular:     Rate and Rhythm: Normal rate and regular rhythm.     Heart sounds: Normal heart sounds.  Pulmonary:     Effort: Pulmonary effort is normal.     Breath sounds: Normal breath sounds.     Comments: Sats 97-100% on RA, no acute distress, able to speak in sentences, able to laugh and joke during exam Chest:     Comments: Tenderness left lower lateral ribs Abdominal:     General: Bowel sounds are normal.     Palpations: Abdomen is soft.  Musculoskeletal:        General: Normal range of motion.     Cervical back: Normal range of motion.  Skin:    General: Skin is warm and dry.  Neurological:     Mental Status: He is alert and oriented to person, place, and time.     ED Results / Procedures / Treatments   Labs (all labs ordered are listed, but only abnormal results are displayed) Labs Reviewed - No data to display  EKG None  Radiology DG Chest Portable 1 View  Result Date: 02/17/2022 CLINICAL DATA:  Evaluate pneumothorax.  Fall. EXAM: PORTABLE CHEST 1 VIEW COMPARISON:  Chest x-ray 12/06/2021.  Chest CT 02/17/2022. FINDINGS: Small left pneumothorax is present (proximally 20%). This is stable to minimally increased compared to the prior CT given differences in technique. Left chest wall emphysema is again seen.  There is no evidence for mediastinal shift or focal lung infiltrate. The cardiomediastinal silhouette is within normal limits. Osseous structures appear stable. IMPRESSION: 1. Small left pneumothorax, stable to minimally increased compared to the prior CT given differences in technique. 2. Left chest wall emphysema. Electronically Signed   By: Ronney Asters M.D.   On: 02/17/2022 21:35   CT ABDOMEN PELVIS WO CONTRAST  Result Date: 02/17/2022 CLINICAL DATA:  Blunt abdominal trauma. Fall getting out of bed. Additional falls in the kitchen. EXAM: CT ABDOMEN AND PELVIS WITHOUT CONTRAST TECHNIQUE: Multidetector CT imaging of the abdomen and pelvis was performed following the  standard protocol without IV contrast. RADIATION DOSE REDUCTION: This exam was performed according to the departmental dose-optimization program which includes automated exposure control, adjustment of the mA and/or kV according to patient size and/or use of iterative reconstruction technique. COMPARISON:  Included portions from chest CT earlier today. Abdominopelvic CT 08/10/2021 FINDINGS: Lower chest: Left basilar pneumothorax, increased from chest CT earlier today. Again seen subcutaneous emphysema in the left chest wall. Left lateral eighth and ninth rib fractures again seen. Hepatobiliary: Lack of contrast limits detailed assessment, allowing for this there is no evidence of injury. Gallbladder physiologically distended, no calcified stone. No biliary dilatation. Pancreas: Unremarkable unenhanced appearance, no evidence of injury. Spleen: Lack of IV contrast limits detailed assessment. Allowing for this, no evidence of splenic injury. No perisplenic free fluid. Adrenals/Urinary Tract: No adrenal hemorrhage or renal injury identified. Bladder is unremarkable. Stomach/Bowel: No obvious bowel injury on this unenhanced exam. There is mild fluid distention of the duodenum. Scattered fluid within nondilated small bowel. No definite bowel wall  thickening or inflammation. Moderate to large colonic stool burden. Normal appendix visualized. Vascular/Lymphatic: Advanced aortic atherosclerosis. There is no perivascular stranding or retroperitoneal fluid to suggest injury. No bulky abdominopelvic adenopathy. Reproductive: Enlarged prostate gland causes mass effect on the bladder base. Other: No free air or free fluid. Musculoskeletal: Unchanged appearance of mild L1 superior endplate compression fracture from CT earlier today. No acute fracture of the pelvis. IMPRESSION: 1. Left basilar pneumothorax, increased from chest CT earlier today. Left lateral eighth and ninth rib fractures again seen. 2. No evidence of acute traumatic injury to the abdomen or pelvis allowing for limitations related to lack of IV contrast. 3. Unchanged appearance of mild L1 superior endplate compression fracture from CT earlier today. 4. Enlarged prostate gland causes mass effect on the bladder base. Aortic Atherosclerosis (ICD10-I70.0). Electronically Signed   By: Keith Rake M.D.   On: 02/17/2022 20:46   CT Head Wo Contrast  Result Date: 02/17/2022 CLINICAL DATA:  Fall. EXAM: CT HEAD WITHOUT CONTRAST CT MAXILLOFACIAL WITHOUT CONTRAST CT CERVICAL SPINE WITHOUT CONTRAST TECHNIQUE: Multidetector CT imaging of the head, cervical spine, and maxillofacial structures were performed using the standard protocol without intravenous contrast. Multiplanar CT image reconstructions of the cervical spine and maxillofacial structures were also generated. RADIATION DOSE REDUCTION: This exam was performed according to the departmental dose-optimization program which includes automated exposure control, adjustment of the mA and/or kV according to patient size and/or use of iterative reconstruction technique. COMPARISON:  MRI brain dated Aug 10, 2021. CT head dated May 07, 2021. CT cervical spine dated February 27, 2021. FINDINGS: CT HEAD FINDINGS Brain: No evidence of acute infarction,  hemorrhage, hydrocephalus, extra-axial collection or mass lesion/mass effect. Stable mild atrophy and chronic microvascular ischemic changes. Vascular: Atherosclerotic vascular calcification of the carotid siphons. No hyperdense vessel. Skull: Normal. Negative for fracture or focal lesion. Other: None. CT MAXILLOFACIAL FINDINGS Osseous: No fracture or mandibular dislocation. No destructive process. Orbits: Negative. No traumatic or inflammatory finding. Sinuses: Tiny retention cysts in the left maxillary sinus. Remaining paranasal sinuses and mastoid air cells are clear. Soft tissues: Negative. CT CERVICAL SPINE FINDINGS Alignment: No traumatic malalignment. Skull base and vertebrae: No acute fracture. No primary bone lesion or focal pathologic process. Soft tissues and spinal canal: No prevertebral fluid or swelling. No visible canal hematoma. Disc levels: Moderate disc height loss and uncovertebral hypertrophy at C5-C6 and C6-C7. Moderate left facet arthropathy at C3-C4. Upper chest: Please see separate CT chest report from same day. Other:  None. IMPRESSION: 1. No acute intracranial abnormality. 2. No acute facial fracture. 3. No acute cervical spine fracture or traumatic malalignment. Electronically Signed   By: Titus Dubin M.D.   On: 02/17/2022 19:19   CT Maxillofacial Wo Contrast  Result Date: 02/17/2022 CLINICAL DATA:  Fall. EXAM: CT HEAD WITHOUT CONTRAST CT MAXILLOFACIAL WITHOUT CONTRAST CT CERVICAL SPINE WITHOUT CONTRAST TECHNIQUE: Multidetector CT imaging of the head, cervical spine, and maxillofacial structures were performed using the standard protocol without intravenous contrast. Multiplanar CT image reconstructions of the cervical spine and maxillofacial structures were also generated. RADIATION DOSE REDUCTION: This exam was performed according to the departmental dose-optimization program which includes automated exposure control, adjustment of the mA and/or kV according to patient size  and/or use of iterative reconstruction technique. COMPARISON:  MRI brain dated Aug 10, 2021. CT head dated May 07, 2021. CT cervical spine dated February 27, 2021. FINDINGS: CT HEAD FINDINGS Brain: No evidence of acute infarction, hemorrhage, hydrocephalus, extra-axial collection or mass lesion/mass effect. Stable mild atrophy and chronic microvascular ischemic changes. Vascular: Atherosclerotic vascular calcification of the carotid siphons. No hyperdense vessel. Skull: Normal. Negative for fracture or focal lesion. Other: None. CT MAXILLOFACIAL FINDINGS Osseous: No fracture or mandibular dislocation. No destructive process. Orbits: Negative. No traumatic or inflammatory finding. Sinuses: Tiny retention cysts in the left maxillary sinus. Remaining paranasal sinuses and mastoid air cells are clear. Soft tissues: Negative. CT CERVICAL SPINE FINDINGS Alignment: No traumatic malalignment. Skull base and vertebrae: No acute fracture. No primary bone lesion or focal pathologic process. Soft tissues and spinal canal: No prevertebral fluid or swelling. No visible canal hematoma. Disc levels: Moderate disc height loss and uncovertebral hypertrophy at C5-C6 and C6-C7. Moderate left facet arthropathy at C3-C4. Upper chest: Please see separate CT chest report from same day. Other: None. IMPRESSION: 1. No acute intracranial abnormality. 2. No acute facial fracture. 3. No acute cervical spine fracture or traumatic malalignment. Electronically Signed   By: Titus Dubin M.D.   On: 02/17/2022 19:19   CT Cervical Spine Wo Contrast  Result Date: 02/17/2022 CLINICAL DATA:  Fall. EXAM: CT HEAD WITHOUT CONTRAST CT MAXILLOFACIAL WITHOUT CONTRAST CT CERVICAL SPINE WITHOUT CONTRAST TECHNIQUE: Multidetector CT imaging of the head, cervical spine, and maxillofacial structures were performed using the standard protocol without intravenous contrast. Multiplanar CT image reconstructions of the cervical spine and maxillofacial  structures were also generated. RADIATION DOSE REDUCTION: This exam was performed according to the departmental dose-optimization program which includes automated exposure control, adjustment of the mA and/or kV according to patient size and/or use of iterative reconstruction technique. COMPARISON:  MRI brain dated Aug 10, 2021. CT head dated May 07, 2021. CT cervical spine dated February 27, 2021. FINDINGS: CT HEAD FINDINGS Brain: No evidence of acute infarction, hemorrhage, hydrocephalus, extra-axial collection or mass lesion/mass effect. Stable mild atrophy and chronic microvascular ischemic changes. Vascular: Atherosclerotic vascular calcification of the carotid siphons. No hyperdense vessel. Skull: Normal. Negative for fracture or focal lesion. Other: None. CT MAXILLOFACIAL FINDINGS Osseous: No fracture or mandibular dislocation. No destructive process. Orbits: Negative. No traumatic or inflammatory finding. Sinuses: Tiny retention cysts in the left maxillary sinus. Remaining paranasal sinuses and mastoid air cells are clear. Soft tissues: Negative. CT CERVICAL SPINE FINDINGS Alignment: No traumatic malalignment. Skull base and vertebrae: No acute fracture. No primary bone lesion or focal pathologic process. Soft tissues and spinal canal: No prevertebral fluid or swelling. No visible canal hematoma. Disc levels: Moderate disc height loss and uncovertebral hypertrophy at C5-C6 and  C6-C7. Moderate left facet arthropathy at C3-C4. Upper chest: Please see separate CT chest report from same day. Other: None. IMPRESSION: 1. No acute intracranial abnormality. 2. No acute facial fracture. 3. No acute cervical spine fracture or traumatic malalignment. Electronically Signed   By: Titus Dubin M.D.   On: 02/17/2022 19:19   CT CHEST WO CONTRAST  Result Date: 02/17/2022 CLINICAL DATA:  Left-sided rib pain after a fall. EXAM: CT CHEST WITHOUT CONTRAST TECHNIQUE: Multidetector CT imaging of the chest was performed  following the standard protocol without IV contrast. RADIATION DOSE REDUCTION: This exam was performed according to the departmental dose-optimization program which includes automated exposure control, adjustment of the mA and/or kV according to patient size and/or use of iterative reconstruction technique. COMPARISON:  Chest x-ray dated December 06, 2021. PET-CT dated October 09, 2021. FINDINGS: Cardiovascular: No significant vascular findings. Normal heart size. No pericardial effusion. Coronary, aortic arch, and branch vessel atherosclerotic vascular disease. Mediastinum/Nodes: No enlarged mediastinal or axillary lymph nodes. Thyroid gland, trachea, and esophagus demonstrate no significant findings. Lungs/Pleura: Small left pneumothorax. Mild subsegmental atelectasis in the posterior left lower lobe. Unchanged calcified granuloma along the left major fissure. The right lung is clear. No consolidation or pleural effusion. Upper Abdomen: No acute abnormality. Musculoskeletal: Acute nondisplaced fracture of the left lateral eighth rib. Acute minimally displaced fracture of the left posterolateral ninth rib. Subcutaneous emphysema in the left posterolateral chest wall extending into the lower neck. Minimal L1 superior endplate compression fracture, new since July. IMPRESSION: 1. Acute fractures of the left eighth and ninth ribs with small left pneumothorax. 2. Minimal L1 superior endplate compression fracture, new since July. Correlate with point tenderness. 3.  Aortic atherosclerosis (ICD10-I70.0). Electronically Signed   By: Titus Dubin M.D.   On: 02/17/2022 19:08    Procedures Procedures  {Document cardiac monitor, telemetry assessment procedure when appropriate:1}  Medications Ordered in ED Medications - No data to display  ED Course/ Medical Decision Making/ A&P                           Medical Decision Making Amount and/or Complexity of Data Reviewed Labs: ordered.   ***  {Document critical  care time when appropriate:1} {Document review of labs and clinical decision tools ie heart score, Chads2Vasc2 etc:1}  {Document your independent review of radiology images, and any outside records:1} {Document your discussion with family members, caretakers, and with consultants:1} {Document social determinants of health affecting pt's care:1} {Document your decision making why or why not admission, treatments were needed:1} Final Clinical Impression(s) / ED Diagnoses Final diagnoses:  None    Rx / DC Orders ED Discharge Orders     None

## 2022-02-17 NOTE — ED Triage Notes (Signed)
BIB [facility] from [facility], patient had a fall. Per initial Chest CT; broken ribs on L side and pneumo on L side. Did repeat Chest CT and pneumo grew in size.   Initial CBG=38, gave amp d50. Went up to 150. Rechecked later and fell back to 28. Given a 2nd amp of D50. Presents to ED on D10 drip '@100m'$ /hr. CBG now= 133.   18G L UA.

## 2022-02-17 NOTE — ED Provider Notes (Signed)
[facility] Provider Note    Event Date/Time   First MD Initiated Contact with Patient 02/17/22 1823     (approximate)   History   Fall   HPI  Nicholas Weiss is a 71 y.o. male with history of smoldering multiple myeloma, hypertension, CVA, and as listed in the EMR presents to the emergency department after falling several times today. He denies loss of consciousness. He recalls trying to get to the "water fountain" for a drink. He is unsure what had caused him to fall. Pain is mainly on the left rib/back area.          Physical Exam   Triage Vital Signs: ED Triage Vitals  Enc Vitals Group     BP 02/17/22 1815 (!) 94/59     Pulse Rate 02/17/22 1815 85     Resp 02/17/22 1815 17     Temp 02/17/22 1815 97.9 F (36.6 C)     Temp Source 02/17/22 1815 Oral     SpO2 02/17/22 1815 100 %     Weight 02/17/22 1816 152 lb (68.9 kg)     Height 02/17/22 1816 5' 10.5" (1.791 m)     Head Circumference --      Peak Flow --      Pain Score --      Pain Loc --      Pain Edu? --      Excl. in [place]? --     Most recent vital signs: Vitals:   02/17/22 1818 02/17/22 1914  BP: 90/74 (!) 154/111  Pulse:  92  Resp:  20  Temp:  98.4 F (36.9 C)  SpO2:  100%     General: Awake, restless, obvious pain CV:  Good peripheral perfusion.  Resp:  Normal effort. Diminished Abd:  No distention. No abdominal tenderness Other:  Facial contusions on forehead and periorbital left eye   ED Results / Procedures / Treatments   Labs (all labs ordered are listed, but only abnormal results are displayed) Labs Reviewed  CBC WITH DIFFERENTIAL/PLATELET - Abnormal; Notable for the following components:      Result Value   RBC 3.31 (*)    Hemoglobin 10.0 (*)    HCT 30.9 (*)    RDW 16.6 (*)    All other components within normal limits  BASIC METABOLIC PANEL - Abnormal; Notable for the following components:   Potassium 5.2 (*)    Glucose, Bld 62 (*)    BUN 96 (*)     Creatinine, Ser 4.98 (*)    Calcium 8.7 (*)    GFR, Estimated 12 (*)    All other components within normal limits  TROPONIN I (HIGH SENSITIVITY) - Abnormal; Notable for the following components:   Troponin I (High Sensitivity) 22 (*)    All other components within normal limits  URINALYSIS, ROUTINE W REFLEX MICROSCOPIC  CBG MONITORING, ED  TYPE AND SCREEN  TROPONIN I (HIGH SENSITIVITY)     EKG  Pending   RADIOLOGY  Rib fractures and pneumothorax noted on chest ct. Image viewed and interpreted by me. Radiology report consistent with the same.  PROCEDURES:  Critical Care performed: No  Procedures   MEDICATIONS ORDERED IN ED: Medications  iohexol (OMNIPAQUE) 300 MG/ML solution 100 mL (has no administration in time range)     IMPRESSION / MDM / ASSESSMENT AND PLAN / ED COURSE  I reviewed the triage vital signs and the nursing notes.  Differential diagnosis includes, but is not limited to, ICH, maxillofacial bone fracture, rib fracture, musculoskeletal pain, cardiac event, CVA, UTI, substance use.  Patient's presentation is most consistent with acute presentation with potential threat to life or bodily function.  71 year old male presents to the ER for evaluation after multiple falls today. See HPI. He is obviously in pain, however slightly hypotensive. Will order labs, EKG, imaging, and repeat vitals.  Notified by radiology staff of probable pneumothorax and rib fractures. CT chest added.  Pneumothorax noted on CT with contiguous rib fractures. Will send to more acute section of ER. Patient stable at this moment.  Patient moved to room 13. Handed off to Dr. Starleen Blue.     FINAL CLINICAL IMPRESSION(S) / ED DIAGNOSES   Final diagnoses:  Traumatic pneumothorax, initial encounter     Rx / DC Orders   ED Discharge Orders     None        Note:  This document was prepared using Dragon voice recognition software and may include  unintentional dictation errors.   Victorino Dike, FNP 02/17/22 2101    Rada Hay, MD 02/17/22 2154    Rada Hay, MD 02/17/22 [phone]

## 2022-02-17 NOTE — ED Notes (Signed)
Pt in CT scanner at this time

## 2022-02-17 NOTE — ED Notes (Addendum)
Pt is slinging his arms and legs, and moving all over the stretcher, monitor cables removed d/t pt's constant movement, Pt is GCS 14, pt able to follow commands and understands needs to lay still and keep monitoring devices on, pt is redirectable, but upon leaving the room pt remains restless in bed with constance movement, Starleen Blue, Jennette Dubin, MD has been made aware

## 2022-02-18 ENCOUNTER — Other Ambulatory Visit: Payer: Medicare Other

## 2022-02-18 ENCOUNTER — Observation Stay (HOSPITAL_COMMUNITY): Payer: Medicare Other

## 2022-02-18 ENCOUNTER — Inpatient Hospital Stay (HOSPITAL_COMMUNITY): Payer: Medicare Other

## 2022-02-18 ENCOUNTER — Ambulatory Visit: Payer: Medicare Other

## 2022-02-18 DIAGNOSIS — Z7982 Long term (current) use of aspirin: Secondary | ICD-10-CM | POA: Diagnosis not present

## 2022-02-18 DIAGNOSIS — N183 Chronic kidney disease, stage 3 unspecified: Secondary | ICD-10-CM | POA: Diagnosis present

## 2022-02-18 DIAGNOSIS — E10649 Type 1 diabetes mellitus with hypoglycemia without coma: Secondary | ICD-10-CM | POA: Diagnosis present

## 2022-02-18 DIAGNOSIS — S2242XA Multiple fractures of ribs, left side, initial encounter for closed fracture: Secondary | ICD-10-CM

## 2022-02-18 DIAGNOSIS — Z794 Long term (current) use of insulin: Secondary | ICD-10-CM | POA: Diagnosis not present

## 2022-02-18 DIAGNOSIS — E162 Hypoglycemia, unspecified: Secondary | ICD-10-CM

## 2022-02-18 DIAGNOSIS — I129 Hypertensive chronic kidney disease with stage 1 through stage 4 chronic kidney disease, or unspecified chronic kidney disease: Secondary | ICD-10-CM | POA: Diagnosis present

## 2022-02-18 DIAGNOSIS — E875 Hyperkalemia: Secondary | ICD-10-CM | POA: Diagnosis present

## 2022-02-18 DIAGNOSIS — E7800 Pure hypercholesterolemia, unspecified: Secondary | ICD-10-CM | POA: Diagnosis present

## 2022-02-18 DIAGNOSIS — N179 Acute kidney failure, unspecified: Secondary | ICD-10-CM | POA: Diagnosis present

## 2022-02-18 DIAGNOSIS — F1410 Cocaine abuse, uncomplicated: Secondary | ICD-10-CM | POA: Diagnosis present

## 2022-02-18 DIAGNOSIS — E1065 Type 1 diabetes mellitus with hyperglycemia: Secondary | ICD-10-CM | POA: Diagnosis not present

## 2022-02-18 DIAGNOSIS — S270XXA Traumatic pneumothorax, initial encounter: Secondary | ICD-10-CM | POA: Diagnosis present

## 2022-02-18 DIAGNOSIS — Z79899 Other long term (current) drug therapy: Secondary | ICD-10-CM | POA: Diagnosis not present

## 2022-02-18 DIAGNOSIS — E1040 Type 1 diabetes mellitus with diabetic neuropathy, unspecified: Secondary | ICD-10-CM | POA: Diagnosis present

## 2022-02-18 DIAGNOSIS — E1022 Type 1 diabetes mellitus with diabetic chronic kidney disease: Secondary | ICD-10-CM | POA: Diagnosis present

## 2022-02-18 DIAGNOSIS — N1831 Chronic kidney disease, stage 3a: Secondary | ICD-10-CM | POA: Diagnosis present

## 2022-02-18 DIAGNOSIS — Z8673 Personal history of transient ischemic attack (TIA), and cerebral infarction without residual deficits: Secondary | ICD-10-CM | POA: Diagnosis not present

## 2022-02-18 DIAGNOSIS — C9000 Multiple myeloma not having achieved remission: Secondary | ICD-10-CM

## 2022-02-18 DIAGNOSIS — R296 Repeated falls: Secondary | ICD-10-CM | POA: Diagnosis present

## 2022-02-18 DIAGNOSIS — Z8249 Family history of ischemic heart disease and other diseases of the circulatory system: Secondary | ICD-10-CM | POA: Diagnosis not present

## 2022-02-18 DIAGNOSIS — Z7902 Long term (current) use of antithrombotics/antiplatelets: Secondary | ICD-10-CM | POA: Diagnosis not present

## 2022-02-18 DIAGNOSIS — R55 Syncope and collapse: Secondary | ICD-10-CM

## 2022-02-18 DIAGNOSIS — I10 Essential (primary) hypertension: Secondary | ICD-10-CM

## 2022-02-18 DIAGNOSIS — W010XXA Fall on same level from slipping, tripping and stumbling without subsequent striking against object, initial encounter: Secondary | ICD-10-CM | POA: Diagnosis present

## 2022-02-18 DIAGNOSIS — Z880 Allergy status to penicillin: Secondary | ICD-10-CM | POA: Diagnosis not present

## 2022-02-18 DIAGNOSIS — Z8711 Personal history of peptic ulcer disease: Secondary | ICD-10-CM | POA: Diagnosis not present

## 2022-02-18 LAB — COMPREHENSIVE METABOLIC PANEL
ALT: 55 U/L — ABNORMAL HIGH (ref 0–44)
ALT: 56 U/L — ABNORMAL HIGH (ref 0–44)
ALT: 42 U/L (ref 0–44)
AST: 40 U/L (ref 15–41)
AST: 43 U/L — ABNORMAL HIGH (ref 15–41)
AST: 36 U/L (ref 15–41)
Albumin: 3.4 g/dL — ABNORMAL LOW (ref 3.5–5.0)
Albumin: 3.3 g/dL — ABNORMAL LOW (ref 3.5–5.0)
Albumin: 2.7 g/dL — ABNORMAL LOW (ref 3.5–5.0)
Alkaline Phosphatase: 58 U/L (ref 38–126)
Alkaline Phosphatase: 54 U/L (ref 38–126)
Alkaline Phosphatase: 50 U/L (ref 38–126)
Anion gap: 10 (ref 5–15)
Anion gap: 16 — ABNORMAL HIGH (ref 5–15)
Anion gap: 10 (ref 5–15)
BUN: 81 mg/dL — ABNORMAL HIGH (ref 8–23)
BUN: 79 mg/dL — ABNORMAL HIGH (ref 8–23)
BUN: 54 mg/dL — ABNORMAL HIGH (ref 8–23)
CO2: 21 mmol/L — ABNORMAL LOW (ref 22–32)
CO2: 17 mmol/L — ABNORMAL LOW (ref 22–32)
CO2: 17 mmol/L — ABNORMAL LOW (ref 22–32)
Calcium: 8.1 mg/dL — ABNORMAL LOW (ref 8.9–10.3)
Calcium: 8.4 mg/dL — ABNORMAL LOW (ref 8.9–10.3)
Calcium: 7.8 mg/dL — ABNORMAL LOW (ref 8.9–10.3)
Chloride: 107 mmol/L (ref 98–111)
Chloride: 106 mmol/L (ref 98–111)
Chloride: 109 mmol/L (ref 98–111)
Creatinine, Ser: 4.1 mg/dL — ABNORMAL HIGH (ref 0.61–1.24)
Creatinine, Ser: 3.67 mg/dL — ABNORMAL HIGH (ref 0.61–1.24)
Creatinine, Ser: 2.36 mg/dL — ABNORMAL HIGH (ref 0.61–1.24)
GFR, Estimated: 15 mL/min — ABNORMAL LOW (ref >60)
GFR, Estimated: 17 mL/min — ABNORMAL LOW (ref >60)
GFR, Estimated: 29 mL/min — ABNORMAL LOW (ref >60)
Glucose, Bld: 101 mg/dL — ABNORMAL HIGH (ref 70–99)
Glucose, Bld: 70 mg/dL (ref 70–99)
Glucose, Bld: 221 mg/dL — ABNORMAL HIGH (ref 70–99)
Potassium: 5.3 mmol/L — ABNORMAL HIGH (ref 3.5–5.1)
Potassium: 5.4 mmol/L — ABNORMAL HIGH (ref 3.5–5.1)
Potassium: 4.9 mmol/L (ref 3.5–5.1)
Sodium: 138 mmol/L (ref 135–145)
Sodium: 139 mmol/L (ref 135–145)
Sodium: 136 mmol/L (ref 135–145)
Total Bilirubin: 0.5 mg/dL (ref 0.3–1.2)
Total Bilirubin: 0.7 mg/dL (ref 0.3–1.2)
Total Bilirubin: 0.5 mg/dL (ref 0.3–1.2)
Total Protein: 6.9 g/dL (ref 6.5–8.1)
Total Protein: 6.7 g/dL (ref 6.5–8.1)
Total Protein: 6 g/dL — ABNORMAL LOW (ref 6.5–8.1)

## 2022-02-18 LAB — GLUCOSE, CAPILLARY
Glucose-Capillary: 173 mg/dL — ABNORMAL HIGH (ref 70–99)
Glucose-Capillary: 206 mg/dL — ABNORMAL HIGH (ref 70–99)
Glucose-Capillary: 216 mg/dL — ABNORMAL HIGH (ref 70–99)

## 2022-02-18 LAB — BASIC METABOLIC PANEL
Anion gap: 7 (ref 5–15)
Anion gap: 7 (ref 5–15)
BUN: 70 mg/dL — ABNORMAL HIGH (ref 8–23)
BUN: 65 mg/dL — ABNORMAL HIGH (ref 8–23)
CO2: 23 mmol/L (ref 22–32)
CO2: 22 mmol/L (ref 22–32)
Calcium: 8.2 mg/dL — ABNORMAL LOW (ref 8.9–10.3)
Calcium: 8.2 mg/dL — ABNORMAL LOW (ref 8.9–10.3)
Chloride: 108 mmol/L (ref 98–111)
Chloride: 108 mmol/L (ref 98–111)
Creatinine, Ser: 3.05 mg/dL — ABNORMAL HIGH (ref 0.61–1.24)
Creatinine, Ser: 2.6 mg/dL — ABNORMAL HIGH (ref 0.61–1.24)
GFR, Estimated: 22 mL/min — ABNORMAL LOW (ref >60)
GFR, Estimated: 26 mL/min — ABNORMAL LOW (ref >60)
Glucose, Bld: 36 mg/dL — CL (ref 70–99)
Glucose, Bld: 47 mg/dL — ABNORMAL LOW (ref 70–99)
Potassium: 4.8 mmol/L (ref 3.5–5.1)
Potassium: 5.6 mmol/L — ABNORMAL HIGH (ref 3.5–5.1)
Sodium: 138 mmol/L (ref 135–145)
Sodium: 137 mmol/L (ref 135–145)

## 2022-02-18 LAB — CBG MONITORING, ED
Glucose-Capillary: 117 mg/dL — ABNORMAL HIGH (ref 70–99)
Glucose-Capillary: 133 mg/dL — ABNORMAL HIGH (ref 70–99)
Glucose-Capillary: 143 mg/dL — ABNORMAL HIGH (ref 70–99)
Glucose-Capillary: 190 mg/dL — ABNORMAL HIGH (ref 70–99)
Glucose-Capillary: 26 mg/dL — CL (ref 70–99)
Glucose-Capillary: 32 mg/dL — CL (ref 70–99)
Glucose-Capillary: 48 mg/dL — ABNORMAL LOW (ref 70–99)
Glucose-Capillary: 97 mg/dL (ref 70–99)

## 2022-02-18 LAB — CBC WITH DIFFERENTIAL/PLATELET
Abs Immature Granulocytes: 0.01 10*3/uL (ref 0.00–0.07)
Abs Immature Granulocytes: 0.01 10*3/uL (ref 0.00–0.07)
Basophils Absolute: 0 10*3/uL (ref 0.0–0.1)
Basophils Absolute: 0 10*3/uL (ref 0.0–0.1)
Basophils Relative: 0 %
Basophils Relative: 0 %
Eosinophils Absolute: 0 10*3/uL (ref 0.0–0.5)
Eosinophils Absolute: 0 10*3/uL (ref 0.0–0.5)
Eosinophils Relative: 1 %
Eosinophils Relative: 1 %
HCT: 29.4 % — ABNORMAL LOW (ref 39.0–52.0)
HCT: 29.6 % — ABNORMAL LOW (ref 39.0–52.0)
Hemoglobin: 9.5 g/dL — ABNORMAL LOW (ref 13.0–17.0)
Hemoglobin: 9.6 g/dL — ABNORMAL LOW (ref 13.0–17.0)
Immature Granulocytes: 0 %
Immature Granulocytes: 0 %
Lymphocytes Relative: 13 %
Lymphocytes Relative: 21 %
Lymphs Abs: 0.5 10*3/uL — ABNORMAL LOW (ref 0.7–4.0)
Lymphs Abs: 0.6 10*3/uL — ABNORMAL LOW (ref 0.7–4.0)
MCH: 31 pg (ref 26.0–34.0)
MCH: 31.6 pg (ref 26.0–34.0)
MCHC: 32.3 g/dL (ref 30.0–36.0)
MCHC: 32.4 g/dL (ref 30.0–36.0)
MCV: 96.1 fL (ref 80.0–100.0)
MCV: 97.4 fL (ref 80.0–100.0)
Monocytes Absolute: 0.4 10*3/uL (ref 0.1–1.0)
Monocytes Absolute: 0.4 10*3/uL (ref 0.1–1.0)
Monocytes Relative: 11 %
Monocytes Relative: 12 %
Neutro Abs: 2.1 10*3/uL (ref 1.7–7.7)
Neutro Abs: 2.6 10*3/uL (ref 1.7–7.7)
Neutrophils Relative %: 66 %
Neutrophils Relative %: 75 %
Platelets: 221 10*3/uL (ref 150–400)
Platelets: 234 10*3/uL (ref 150–400)
RBC: 3.04 MIL/uL — ABNORMAL LOW (ref 4.22–5.81)
RBC: 3.06 MIL/uL — ABNORMAL LOW (ref 4.22–5.81)
RDW: 16.6 % — ABNORMAL HIGH (ref 11.5–15.5)
RDW: 16.7 % — ABNORMAL HIGH (ref 11.5–15.5)
WBC: 3.1 10*3/uL — ABNORMAL LOW (ref 4.0–10.5)
WBC: 3.4 10*3/uL — ABNORMAL LOW (ref 4.0–10.5)
nRBC: 0 % (ref 0.0–0.2)
nRBC: 0 % (ref 0.0–0.2)

## 2022-02-18 LAB — MAGNESIUM: Magnesium: 2.5 mg/dL — ABNORMAL HIGH (ref 1.7–2.4)

## 2022-02-18 LAB — BETA-HYDROXYBUTYRIC ACID: Beta-Hydroxybutyric Acid: <0.05 mmol/L — ABNORMAL LOW (ref 0.05–0.27)

## 2022-02-18 MED ORDER — OXYCODONE HCL 5 MG PO TABS
10.0000 mg | ORAL_TABLET | ORAL | Status: DC | PRN
  Administered 2022-02-18 – 2022-02-22 (×8): 10 mg via ORAL
  Filled 2022-02-18 (×8): qty 2

## 2022-02-18 MED ORDER — FENTANYL CITRATE PF 50 MCG/ML IJ SOSY
50.0000 ug | PREFILLED_SYRINGE | Freq: Once | INTRAMUSCULAR | Status: AC
  Administered 2022-02-18: 50 ug via INTRAVENOUS
  Filled 2022-02-18: qty 1

## 2022-02-18 MED ORDER — METHOCARBAMOL 1000 MG/10ML IJ SOLN
500.0000 mg | Freq: Four times a day (QID) | INTRAVENOUS | Status: DC | PRN
  Filled 2022-02-18: qty 5

## 2022-02-18 MED ORDER — ONDANSETRON HCL 4 MG PO TABS: 4.0000 mg | ORAL_TABLET | Freq: Four times a day (QID) | ORAL | Status: DC | PRN

## 2022-02-18 MED ORDER — SODIUM CHLORIDE 0.9 % IV SOLN: INTRAVENOUS | Status: DC

## 2022-02-18 MED ORDER — PROCHLORPERAZINE EDISYLATE 10 MG/2ML IJ SOLN: 10.0000 mg | INTRAMUSCULAR | Status: DC | PRN

## 2022-02-18 MED ORDER — ACETAMINOPHEN 325 MG PO TABS
650.0000 mg | ORAL_TABLET | Freq: Four times a day (QID) | ORAL | Status: DC
  Administered 2022-02-18 – 2022-02-25 (×27): 650 mg via ORAL
  Filled 2022-02-18 (×27): qty 2

## 2022-02-18 MED ORDER — DOCUSATE SODIUM 100 MG PO CAPS
100.0000 mg | ORAL_CAPSULE | Freq: Two times a day (BID) | ORAL | Status: DC
  Administered 2022-02-18 – 2022-02-19 (×4): 100 mg via ORAL
  Filled 2022-02-18 (×4): qty 1

## 2022-02-18 MED ORDER — DEXTROSE 50 % IV SOLN
0.0000 mL | INTRAVENOUS | Status: DC | PRN
  Administered 2022-02-18 – 2022-02-22 (×8): 50 mL via INTRAVENOUS
  Filled 2022-02-18 (×8): qty 50

## 2022-02-18 MED ORDER — DEXTROSE IN LACTATED RINGERS 5 % IV SOLN: INTRAVENOUS | Status: DC

## 2022-02-18 MED ORDER — LACTATED RINGERS IV BOLUS
1000.0000 mL | Freq: Once | INTRAVENOUS | Status: AC
  Administered 2022-02-18: 1000 mL via INTRAVENOUS

## 2022-02-18 MED ORDER — SIMETHICONE 80 MG PO CHEW
80.0000 mg | CHEWABLE_TABLET | Freq: Four times a day (QID) | ORAL | Status: DC | PRN
  Administered 2022-02-19: 80 mg via ORAL
  Filled 2022-02-18: qty 1

## 2022-02-18 MED ORDER — INSULIN ASPART 100 UNIT/ML IJ SOLN
0.0000 [IU] | Freq: Three times a day (TID) | INTRAMUSCULAR | Status: DC
  Administered 2022-02-20: 1 [IU] via SUBCUTANEOUS

## 2022-02-18 MED ORDER — ONDANSETRON HCL 4 MG/2ML IJ SOLN: 4.0000 mg | Freq: Four times a day (QID) | INTRAMUSCULAR | Status: DC | PRN

## 2022-02-18 MED ORDER — OXYCODONE HCL 5 MG PO TABS: 5.0000 mg | ORAL_TABLET | ORAL | Status: DC | PRN

## 2022-02-18 MED ORDER — ACETAMINOPHEN 650 MG RE SUPP: 650.0000 mg | Freq: Four times a day (QID) | RECTAL | Status: DC | PRN

## 2022-02-18 MED ORDER — INSULIN DETEMIR 100 UNIT/ML ~~LOC~~ SOLN
5.0000 [IU] | Freq: Every day | SUBCUTANEOUS | Status: DC
  Administered 2022-02-18: 5 [IU] via SUBCUTANEOUS
  Filled 2022-02-18 (×2): qty 0.05

## 2022-02-18 MED ORDER — AMLODIPINE BESYLATE 10 MG PO TABS
10.0000 mg | ORAL_TABLET | Freq: Every day | ORAL | Status: DC
  Administered 2022-02-18 – 2022-02-23 (×6): 10 mg via ORAL
  Filled 2022-02-18 (×3): qty 1
  Filled 2022-02-18: qty 2
  Filled 2022-02-18 (×2): qty 1

## 2022-02-18 MED ORDER — SODIUM ZIRCONIUM CYCLOSILICATE 10 G PO PACK
10.0000 g | PACK | Freq: Every day | ORAL | Status: AC
  Administered 2022-02-18: 10 g via ORAL
  Filled 2022-02-18: qty 1

## 2022-02-18 MED ORDER — INSULIN REGULAR(HUMAN) IN NACL 100-0.9 UT/100ML-% IV SOLN: INTRAVENOUS | Status: DC

## 2022-02-18 MED ORDER — DEXTROSE 50 % IV SOLN
1.0000 | Freq: Once | INTRAVENOUS | Status: AC
  Administered 2022-02-18: 50 mL via INTRAVENOUS
  Filled 2022-02-18: qty 50

## 2022-02-18 MED ORDER — INSULIN ASPART 100 UNIT/ML IJ SOLN: 0.0000 [IU] | Freq: Every day | INTRAMUSCULAR | Status: DC

## 2022-02-18 MED ORDER — GABAPENTIN 300 MG PO CAPS: 300.0000 mg | ORAL_CAPSULE | Freq: Three times a day (TID) | ORAL | Status: DC

## 2022-02-18 MED ORDER — OXYCODONE HCL 5 MG PO TABS
5.0000 mg | ORAL_TABLET | ORAL | Status: DC | PRN
  Administered 2022-02-18 – 2022-02-19 (×2): 5 mg via ORAL
  Filled 2022-02-18 (×2): qty 1

## 2022-02-18 MED ORDER — ACETAMINOPHEN 325 MG PO TABS: 650.0000 mg | ORAL_TABLET | Freq: Four times a day (QID) | ORAL | Status: DC | PRN

## 2022-02-18 MED ORDER — HYDROMORPHONE HCL 1 MG/ML IJ SOLN
0.5000 mg | INTRAMUSCULAR | Status: DC | PRN
  Administered 2022-02-18 – 2022-02-19 (×2): 0.5 mg via INTRAVENOUS
  Filled 2022-02-18: qty 0.5
  Filled 2022-02-18: qty 1

## 2022-02-18 MED ORDER — GABAPENTIN 100 MG PO CAPS
100.0000 mg | ORAL_CAPSULE | Freq: Three times a day (TID) | ORAL | Status: DC
  Administered 2022-02-18 – 2022-02-25 (×22): 100 mg via ORAL
  Filled 2022-02-18 (×22): qty 1

## 2022-02-18 NOTE — H&P (Signed)
History and Physical    Nicholas Weiss JOI:[id number] DOB: October 30, 1950 DOA: 02/17/2022  DOS: the patient was seen and examined on 02/17/2022  PCP: Dion Body, MD   Patient coming from: Home  I have personally briefly reviewed patient's old medical records in [place]  CC: transferred for trauma evaluation HPI: 71 year old African-American male history of cocaine abuse, type 1 diabetes, multiple myeloma, CKD stage IIIa baseline creatinine 1.5-1.7, presents as a transfer from [facility] ER due to traumatic pneumothorax.  Patient states that he was doing some cocaine last week.  This is a chronic drug use problem for him.  He states he got out of bed last night.  He stumbled around and fell in his kitchen.  He states he fell about 3 times.  He struck the left side of his chest on his stove.  He had immediate pain.  I called 911.  He is brought to the [facility] ER for evaluation. he is found to have traumatic pneumothorax.  Also was noted to have hyperglycemia along with acute renal failure.  EDP was uncomfortable managing patient's traumatic pneumothorax and and sent the patient to [name] ER for evaluation.  Still awaiting trauma evaluation. Patient is noted to have acute renal failure on his labs at [facility]. His baseline creatinine is approximately 1.5-1.7.  In the ER, he was noted to have a creatinine of 4.0.  He is also noted to be persistently hyperglycemic.  He required a dextrose infusion to keep his blood sugar elevated.  [facility] hospitalist contacted for admission due to his acute renal failure and hypoglycemia.   ED Course: Traumatic pneumothorax, multiple left-sided rib fractures.  Creatinine elevated to over 4.  Baseline creatinine 1.5-1.7.  Also persistently hyperglycemic.  Review of Systems:  Review of Systems  Constitutional: Negative.   HENT: Negative.    Eyes: Negative.   Respiratory: Negative.    Cardiovascular:        Left-sided rib pain with  inspiration.  Gastrointestinal: Negative.   Genitourinary:  Positive for frequency.  Musculoskeletal:  Positive for falls.       Left-sided rib pain.  Skin: Negative.   Neurological: Negative.   Endo/Heme/Allergies: Negative.   Psychiatric/Behavioral:  Positive for substance abuse.   All other systems reviewed and are negative.   Past Medical History:  Diagnosis Date   DKA (diabetic ketoacidoses) 04/06/2016   Hypercholesteremia    Hypertension     Past Surgical History:  Procedure Laterality Date   COLONOSCOPY WITH PROPOFOL N/A 02/01/2020   Procedure: COLONOSCOPY WITH PROPOFOL;  Surgeon: Lin Landsman, MD;  Location: [facility] ENDOSCOPY;  Service: Gastroenterology;  Laterality: N/A;   ESOPHAGOGASTRODUODENOSCOPY  02/01/2020   Procedure: ESOPHAGOGASTRODUODENOSCOPY (EGD);  Surgeon: Lin Landsman, MD;  Location: [facility] ENDOSCOPY;  Service: Gastroenterology;;   KNEE SURGERY Right    Torn meniscus   KNEE SURGERY Left      reports that he has never smoked. He has never used smokeless tobacco. He reports that he does not currently use alcohol. He reports current drug use. Drugs: Marijuana and "Crack" cocaine.  Allergies  Allergen Reactions   Penicillins Other (See Comments)    Has patient had a PCN reaction causing immediate rash, facial/tongue/throat swelling, SOB or lightheadedness with hypotension: No Has patient had a PCN reaction causing severe rash involving mucus membranes or skin necrosis: No Has patient had a PCN reaction that required hospitalization No Has patient had a PCN reaction occurring within the last 10 years: No If all of  the above answers are "NO", then may proceed with Cephalosporin use.  Other reaction(s): Other (see comments) Other reaction(s): Other (See Comments) Has patient had a PCN reaction causing immediate rash, facial/tongue/throat swelling, SOB or lightheadedness with hypotension: No Has patient had a PCN reaction causing severe rash involving  mucus membranes or skin necrosis: No Has patient had a PCN reaction that required hospitalization No Has patient had a PCN reaction occurring within the last 10 years: No If all of the above answers are "NO", then may proceed with Cephalosporin use. Has patient had a PCN reaction causing immediate rash, facial/tongue/throat swelling, SOB or lightheadedness with hypotension: No Has patient had a PCN reaction causing severe rash involving mucus membranes or skin necrosis: No Has patient had a PCN reaction that required hospitalization No Has patient had a PCN reaction occurring within the last 10 years: No If all of the above answers are "NO", then may proceed with Cephalosporin use.    Family History  Problem Relation Age of Onset   Heart attack Father    Hypertension Sister    Cancer Sister     Prior to Admission medications   Medication Sig Start Date End Date Taking? Authorizing Provider  acyclovir (ZOVIRAX) 400 MG tablet Take 1 tablet (400 mg total) by mouth 2 (two) times daily. Patient not taking: Reported on 02/13/2022 11/21/21   Sindy Guadeloupe, MD  Alcohol Swabs (B-D SINGLE USE SWABS REGULAR) PADS Use to check blood sugar four times daily for type 1 diabetes 07/11/20   Birdie Sons, MD  amLODipine (NORVASC) 10 MG tablet Take 1 tablet (10 mg total) by mouth daily. 11/28/21 02/26/22  Enzo Bi, MD  ASPIRIN 81 PO Take 81 mg by mouth daily.    [provider]  atorvastatin (LIPITOR) 80 MG tablet Take 1 tablet (80 mg total) by mouth at bedtime. 08/11/21   Shawna Clamp, MD  baclofen 5 MG TABS Take 5 mg by mouth 3 (three) times daily as needed for muscle spasms. Patient not taking: Reported on 02/13/2022 11/07/21   Ventura Sellers, MD  Blood Glucose Calibration (TRUE METRIX LEVEL 1) Low SOLN Use to check blood sugar four times daily for type 1 diabetes 07/11/20   Birdie Sons, MD  clopidogrel (PLAVIX) 75 MG tablet Take by mouth. 12/03/21   [provider]   dicyclomine (BENTYL) 20 MG tablet Take 1 tablet (20 mg total) by mouth 3 (three) times daily with meals as needed for spasms. Patient not taking: Reported on 02/06/2022 12/09/21 01/08/22  Sidney Ace, MD  Docusate Sodium (DSS) 100 MG CAPS Take by mouth.    [provider]  FARXIGA 10 MG TABS tablet Take 10 mg by mouth daily. 05/05/21   [provider]  gabapentin (NEURONTIN) 300 MG capsule Take 300 mg by mouth in the morning and 600 mg at night. 04/04/21   [provider]  insulin degludec (TRESIBA FLEXTOUCH) 100 UNIT/ML FlexTouch Pen Inject 20 Units into the skin daily. Home dose. 11/28/21   Enzo Bi, MD  Insulin Syringe-Needle U-100 (INSULIN SYRINGE 1CC/31GX5/16") 31G X 5/16" 1 ML MISC For insulin injections up to 4 times daily 07/11/20   Birdie Sons, MD  lenalidomide (REVLIMID) 10 MG capsule TAKE 1 CAPSULE BY MOUTH DAILY FOR 14 DAYS, THEN HOLD FOR 7 DAYS. REPEAT EVERY 21 DAYS 02/06/22   Sindy Guadeloupe, MD  lisinopril (ZESTRIL) 10 MG tablet Take 1 tablet (10 mg total) by mouth daily. 11/28/21 02/26/22  Enzo Bi, MD  NOVOLOG 100 UNIT/ML injection INJECT UP TO 14 UNITS THREE TIMES DAILY BEFORE MEALS ACCORDING TO SLIDING SCALE Patient not taking: Reported on 02/13/2022 09/09/21   Birdie Sons, MD  ondansetron (ZOFRAN) 8 MG tablet Take 8 mg by mouth 2 (two) times daily as needed. Patient not taking: Reported on 02/06/2022 01/01/22   [provider]  tamsulosin (FLOMAX) 0.4 MG CAPS capsule Take by mouth. Patient not taking: Reported on 02/13/2022 02/12/22   [provider]  prochlorperazine (COMPAZINE) 10 MG tablet Take 1 tablet (10 mg total) by mouth every 6 (six) hours as needed (Nausea or vomiting). Patient not taking: Reported on 10/17/2021 09/29/21 11/21/21  Sindy Guadeloupe, MD    Physical Exam: Vitals:   02/17/22 2353 02/17/22 2356 02/17/22 2358  BP:   123/85  Pulse: 90    Resp: 16    Temp: 98.4 F (36.9 C)    TempSrc: Oral    SpO2: 97%     Weight:  68.9 kg   Height:  _0  (1.778 m)     Physical Exam Vitals and nursing note reviewed.  Constitutional:      General: He is not in acute distress.    Appearance: He is not ill-appearing, toxic-appearing or diaphoretic.  HENT:     Head: Normocephalic and atraumatic.     Nose: Congestion present.  Cardiovascular:     Rate and Rhythm: Normal rate and regular rhythm.     Pulses: Normal pulses.  Pulmonary:     Effort: Pulmonary effort is normal.     Comments: Subcutaneous crepitus noted on left anterior chest wall near the clavicle. Abdominal:     General: Abdomen is flat. Bowel sounds are normal. There is no distension.     Palpations: Abdomen is soft.     Tenderness: There is no abdominal tenderness. There is no rebound.  Musculoskeletal:     Right lower leg: No edema.     Left lower leg: No edema.  Skin:    General: Skin is warm and dry.     Capillary Refill: Capillary refill takes less than 2 seconds.  Neurological:     General: No focal deficit present.     Mental Status: He is alert and oriented to person, place, and time.      Labs on Admission: I have personally reviewed following labs and imaging studies  CBC: Recent Labs  Lab 02/13/22 0929 02/17/22 1918 02/18/22 0015  WBC 3.1* 4.3 3.4*  NEUTROABS 0.9* 2.8 2.6  HGB 9.5* 10.0* 9.5*  HCT 29.8* 30.9* 29.4*  MCV 95.8 93.4 96.1  PLT 251 269 951   Basic Metabolic Panel: Recent Labs  Lab 02/13/22 0929 02/17/22 1918 02/18/22 0015  NA 136 140 138  K 4.7 5.2* 5.3*  CL 106 107 107  CO2 25 23 21*  GLUCOSE 104* 62* 101*  BUN 33* 96* 81*  CREATININE 1.74* 4.98* 4.10*  CALCIUM 8.5* 8.7* 8.1*   GFR: Estimated Creatinine Clearance: 16.8 mL/min (A) (by C-G formula based on SCr of 4.1 mg/dL (H)). Liver Function Tests: Recent Labs  Lab 02/13/22 0929 02/17/22 2119 02/18/22 0015  AST 107* 35 40  ALT 116* 62* 55*  ALKPHOS 64 62 58  BILITOT 0.4 0.8 0.5  PROT 7.4 8.2* 6.9  ALBUMIN 3.5 3.9 3.4*    No results for input(s): "LIPASE", "AMYLASE" in the last 168 hours. No results for input(s): "AMMONIA" in the last 168 hours. Coagulation Profile: Recent Labs  Lab  02/17/22 2119  INR 1.1   Cardiac Enzymes: Recent Labs  Lab 02/17/22 1918 02/17/22 2119  TROPONINIHS 22* 21*   BNP (last 3 results) No results for input(s): "PROBNP" in the last 8760 hours. HbA1C: No results for input(s): "HGBA1C" in the last 72 hours. CBG: Recent Labs  Lab 02/17/22 2142 02/17/22 2300 02/17/22 2303 02/17/22 2313 02/18/22 0016  GLUCAP 113* 28* 28* 158* 97   Lipid Profile: No results for input(s): "CHOL", "HDL", "LDLCALC", "TRIG", "CHOLHDL", "LDLDIRECT" in the last 72 hours. Thyroid Function Tests: No results for input(s): "TSH", "T4TOTAL", "FREET4", "T3FREE", "THYROIDAB" in the last 72 hours. Anemia Panel: No results for input(s): "VITAMINB12", "FOLATE", "FERRITIN", "TIBC", "IRON", "RETICCTPCT" in the last 72 hours. Urine analysis:    Component Value Date/Time   COLORURINE YELLOW (A) 02/17/2022 2240   APPEARANCEUR CLOUDY (A) 02/17/2022 2240   LABSPEC 1.015 02/17/2022 2240   PHURINE 7.0 02/17/2022 2240   GLUCOSEU >=500 (A) 02/17/2022 2240   HGBUR MODERATE (A) 02/17/2022 2240   BILIRUBINUR NEGATIVE 02/17/2022 2240   KETONESUR NEGATIVE 02/17/2022 2240   PROTEINUR 100 (A) 02/17/2022 2240   NITRITE NEGATIVE 02/17/2022 2240   LEUKOCYTESUR SMALL (A) 02/17/2022 2240    Radiological Exams on Admission: I have personally reviewed images DG Chest Portable 1 View  Result Date: 02/17/2022 CLINICAL DATA:  Evaluate pneumothorax.  Fall. EXAM: PORTABLE CHEST 1 VIEW COMPARISON:  Chest x-ray 12/06/2021.  Chest CT 02/17/2022. FINDINGS: Small left pneumothorax is present (proximally 20%). This is stable to minimally increased compared to the prior CT given differences in technique. Left chest wall emphysema is again seen. There is no evidence for mediastinal shift or focal lung infiltrate. The  cardiomediastinal silhouette is within normal limits. Osseous structures appear stable. IMPRESSION: 1. Small left pneumothorax, stable to minimally increased compared to the prior CT given differences in technique. 2. Left chest wall emphysema. Electronically Signed   By: Ronney Asters M.D.   On: 02/17/2022 21:35   CT ABDOMEN PELVIS WO CONTRAST  Result Date: 02/17/2022 CLINICAL DATA:  Blunt abdominal trauma. Fall getting out of bed. Additional falls in the kitchen. EXAM: CT ABDOMEN AND PELVIS WITHOUT CONTRAST TECHNIQUE: Multidetector CT imaging of the abdomen and pelvis was performed following the standard protocol without IV contrast. RADIATION DOSE REDUCTION: This exam was performed according to the departmental dose-optimization program which includes automated exposure control, adjustment of the mA and/or kV according to patient size and/or use of iterative reconstruction technique. COMPARISON:  Included portions from chest CT earlier today. Abdominopelvic CT 08/10/2021 FINDINGS: Lower chest: Left basilar pneumothorax, increased from chest CT earlier today. Again seen subcutaneous emphysema in the left chest wall. Left lateral eighth and ninth rib fractures again seen. Hepatobiliary: Lack of contrast limits detailed assessment, allowing for this there is no evidence of injury. Gallbladder physiologically distended, no calcified stone. No biliary dilatation. Pancreas: Unremarkable unenhanced appearance, no evidence of injury. Spleen: Lack of IV contrast limits detailed assessment. Allowing for this, no evidence of splenic injury. No perisplenic free fluid. Adrenals/Urinary Tract: No adrenal hemorrhage or renal injury identified. Bladder is unremarkable. Stomach/Bowel: No obvious bowel injury on this unenhanced exam. There is mild fluid distention of the duodenum. Scattered fluid within nondilated small bowel. No definite bowel wall thickening or inflammation. Moderate to large colonic stool burden. Normal  appendix visualized. Vascular/Lymphatic: Advanced aortic atherosclerosis. There is no perivascular stranding or retroperitoneal fluid to suggest injury. No bulky abdominopelvic adenopathy. Reproductive: Enlarged prostate gland causes mass effect on the bladder base. Other: No  free air or free fluid. Musculoskeletal: Unchanged appearance of mild L1 superior endplate compression fracture from CT earlier today. No acute fracture of the pelvis. IMPRESSION: 1. Left basilar pneumothorax, increased from chest CT earlier today. Left lateral eighth and ninth rib fractures again seen. 2. No evidence of acute traumatic injury to the abdomen or pelvis allowing for limitations related to lack of IV contrast. 3. Unchanged appearance of mild L1 superior endplate compression fracture from CT earlier today. 4. Enlarged prostate gland causes mass effect on the bladder base. Aortic Atherosclerosis (ICD10-I70.0). Electronically Signed   By: Keith Rake M.D.   On: 02/17/2022 20:46   CT Head Wo Contrast  Result Date: 02/17/2022 CLINICAL DATA:  Fall. EXAM: CT HEAD WITHOUT CONTRAST CT MAXILLOFACIAL WITHOUT CONTRAST CT CERVICAL SPINE WITHOUT CONTRAST TECHNIQUE: Multidetector CT imaging of the head, cervical spine, and maxillofacial structures were performed using the standard protocol without intravenous contrast. Multiplanar CT image reconstructions of the cervical spine and maxillofacial structures were also generated. RADIATION DOSE REDUCTION: This exam was performed according to the departmental dose-optimization program which includes automated exposure control, adjustment of the mA and/or kV according to patient size and/or use of iterative reconstruction technique. COMPARISON:  MRI brain dated Aug 10, 2021. CT head dated May 07, 2021. CT cervical spine dated February 27, 2021. FINDINGS: CT HEAD FINDINGS Brain: No evidence of acute infarction, hemorrhage, hydrocephalus, extra-axial collection or mass lesion/mass effect.  Stable mild atrophy and chronic microvascular ischemic changes. Vascular: Atherosclerotic vascular calcification of the carotid siphons. No hyperdense vessel. Skull: Normal. Negative for fracture or focal lesion. Other: None. CT MAXILLOFACIAL FINDINGS Osseous: No fracture or mandibular dislocation. No destructive process. Orbits: Negative. No traumatic or inflammatory finding. Sinuses: Tiny retention cysts in the left maxillary sinus. Remaining paranasal sinuses and mastoid air cells are clear. Soft tissues: Negative. CT CERVICAL SPINE FINDINGS Alignment: No traumatic malalignment. Skull base and vertebrae: No acute fracture. No primary bone lesion or focal pathologic process. Soft tissues and spinal canal: No prevertebral fluid or swelling. No visible canal hematoma. Disc levels: Moderate disc height loss and uncovertebral hypertrophy at C5-C6 and C6-C7. Moderate left facet arthropathy at C3-C4. Upper chest: Please see separate CT chest report from same day. Other: None. IMPRESSION: 1. No acute intracranial abnormality. 2. No acute facial fracture. 3. No acute cervical spine fracture or traumatic malalignment. Electronically Signed   By: Titus Dubin M.D.   On: 02/17/2022 19:19   CT Maxillofacial Wo Contrast  Result Date: 02/17/2022 CLINICAL DATA:  Fall. EXAM: CT HEAD WITHOUT CONTRAST CT MAXILLOFACIAL WITHOUT CONTRAST CT CERVICAL SPINE WITHOUT CONTRAST TECHNIQUE: Multidetector CT imaging of the head, cervical spine, and maxillofacial structures were performed using the standard protocol without intravenous contrast. Multiplanar CT image reconstructions of the cervical spine and maxillofacial structures were also generated. RADIATION DOSE REDUCTION: This exam was performed according to the departmental dose-optimization program which includes automated exposure control, adjustment of the mA and/or kV according to patient size and/or use of iterative reconstruction technique. COMPARISON:  MRI brain dated Aug 10, 2021. CT head dated May 07, 2021. CT cervical spine dated February 27, 2021. FINDINGS: CT HEAD FINDINGS Brain: No evidence of acute infarction, hemorrhage, hydrocephalus, extra-axial collection or mass lesion/mass effect. Stable mild atrophy and chronic microvascular ischemic changes. Vascular: Atherosclerotic vascular calcification of the carotid siphons. No hyperdense vessel. Skull: Normal. Negative for fracture or focal lesion. Other: None. CT MAXILLOFACIAL FINDINGS Osseous: No fracture or mandibular dislocation. No destructive process. Orbits: Negative. No traumatic or  inflammatory finding. Sinuses: Tiny retention cysts in the left maxillary sinus. Remaining paranasal sinuses and mastoid air cells are clear. Soft tissues: Negative. CT CERVICAL SPINE FINDINGS Alignment: No traumatic malalignment. Skull base and vertebrae: No acute fracture. No primary bone lesion or focal pathologic process. Soft tissues and spinal canal: No prevertebral fluid or swelling. No visible canal hematoma. Disc levels: Moderate disc height loss and uncovertebral hypertrophy at C5-C6 and C6-C7. Moderate left facet arthropathy at C3-C4. Upper chest: Please see separate CT chest report from same day. Other: None. IMPRESSION: 1. No acute intracranial abnormality. 2. No acute facial fracture. 3. No acute cervical spine fracture or traumatic malalignment. Electronically Signed   By: Titus Dubin M.D.   On: 02/17/2022 19:19   CT Cervical Spine Wo Contrast  Result Date: 02/17/2022 CLINICAL DATA:  Fall. EXAM: CT HEAD WITHOUT CONTRAST CT MAXILLOFACIAL WITHOUT CONTRAST CT CERVICAL SPINE WITHOUT CONTRAST TECHNIQUE: Multidetector CT imaging of the head, cervical spine, and maxillofacial structures were performed using the standard protocol without intravenous contrast. Multiplanar CT image reconstructions of the cervical spine and maxillofacial structures were also generated. RADIATION DOSE REDUCTION: This exam was performed according  to the departmental dose-optimization program which includes automated exposure control, adjustment of the mA and/or kV according to patient size and/or use of iterative reconstruction technique. COMPARISON:  MRI brain dated Aug 10, 2021. CT head dated May 07, 2021. CT cervical spine dated February 27, 2021. FINDINGS: CT HEAD FINDINGS Brain: No evidence of acute infarction, hemorrhage, hydrocephalus, extra-axial collection or mass lesion/mass effect. Stable mild atrophy and chronic microvascular ischemic changes. Vascular: Atherosclerotic vascular calcification of the carotid siphons. No hyperdense vessel. Skull: Normal. Negative for fracture or focal lesion. Other: None. CT MAXILLOFACIAL FINDINGS Osseous: No fracture or mandibular dislocation. No destructive process. Orbits: Negative. No traumatic or inflammatory finding. Sinuses: Tiny retention cysts in the left maxillary sinus. Remaining paranasal sinuses and mastoid air cells are clear. Soft tissues: Negative. CT CERVICAL SPINE FINDINGS Alignment: No traumatic malalignment. Skull base and vertebrae: No acute fracture. No primary bone lesion or focal pathologic process. Soft tissues and spinal canal: No prevertebral fluid or swelling. No visible canal hematoma. Disc levels: Moderate disc height loss and uncovertebral hypertrophy at C5-C6 and C6-C7. Moderate left facet arthropathy at C3-C4. Upper chest: Please see separate CT chest report from same day. Other: None. IMPRESSION: 1. No acute intracranial abnormality. 2. No acute facial fracture. 3. No acute cervical spine fracture or traumatic malalignment. Electronically Signed   By: Titus Dubin M.D.   On: 02/17/2022 19:19   CT CHEST WO CONTRAST  Result Date: 02/17/2022 CLINICAL DATA:  Left-sided rib pain after a fall. EXAM: CT CHEST WITHOUT CONTRAST TECHNIQUE: Multidetector CT imaging of the chest was performed following the standard protocol without IV contrast. RADIATION DOSE REDUCTION: This exam was  performed according to the departmental dose-optimization program which includes automated exposure control, adjustment of the mA and/or kV according to patient size and/or use of iterative reconstruction technique. COMPARISON:  Chest x-ray dated December 06, 2021. PET-CT dated October 09, 2021. FINDINGS: Cardiovascular: No significant vascular findings. Normal heart size. No pericardial effusion. Coronary, aortic arch, and branch vessel atherosclerotic vascular disease. Mediastinum/Nodes: No enlarged mediastinal or axillary lymph nodes. Thyroid gland, trachea, and esophagus demonstrate no significant findings. Lungs/Pleura: Small left pneumothorax. Mild subsegmental atelectasis in the posterior left lower lobe. Unchanged calcified granuloma along the left major fissure. The right lung is clear. No consolidation or pleural effusion. Upper Abdomen: No acute abnormality. Musculoskeletal: Acute  nondisplaced fracture of the left lateral eighth rib. Acute minimally displaced fracture of the left posterolateral ninth rib. Subcutaneous emphysema in the left posterolateral chest wall extending into the lower neck. Minimal L1 superior endplate compression fracture, new since July. IMPRESSION: 1. Acute fractures of the left eighth and ninth ribs with small left pneumothorax. 2. Minimal L1 superior endplate compression fracture, new since July. Correlate with point tenderness. 3.  Aortic atherosclerosis (ICD10-I70.0). Electronically Signed   By: Titus Dubin M.D.   On: 02/17/2022 19:08    EKG: My personal interpretation of EKG shows: NSR    Assessment/Plan Principal Problem:   Acute renal failure superimposed on stage 3 chronic kidney disease, unspecified acute renal failure type, unspecified whether stage 3a or 3b CKD (HCC) Active Problems:   Hypoglycemia   Cocaine abuse (HCC)   Syncope and collapse   Traumatic pneumothorax   Multiple fractures of ribs, left side, initial encounter for closed fracture   Type 1  diabetes mellitus with diabetic neuropathy, unspecified ([place])   Essential (primary) hypertension   Multiple myeloma ([place])    Assessment and Plan: * Acute renal failure superimposed on stage 3 chronic kidney disease, unspecified acute renal failure type, unspecified whether stage 3a or 3b CKD ([place]) Admit to observation. Continue with IVF. Place foley for accurate I&Os  Multiple fractures of ribs, left side, initial encounter for closed fracture Trauma surgery to address.  Traumatic pneumothorax Trauma surgery to address.  Syncope and collapse Likely a combination of ARF and cocaine abuse. Echo at end of August 2023 showed normal LVEF. Continue with telemetry  Cocaine abuse ([place]) UDS persistently positive for cocaine. Pt admits to using cocaine.  Hypoglycemia Had persistent hypoglycemia while at [facility] ED. Continue with glucose infusion. Likely due to ARF. Given his type 1 DM, cannot hold SQ insulin for more than 24 hours. Will need to restart insulin in the next 12-24 hours.   Multiple myeloma (HCC) Chronic.  Essential (primary) hypertension Hold ACEI during ARF.  Type 1 diabetes mellitus with diabetic neuropathy, unspecified (HCC) Chronic. Hold insulin while hypoglycemic requiring dextrose infusion.    DVT prophylaxis: SCDs Code Status: Full Code Family Communication: no family at bedside  Disposition Plan: return home  Consults called: EDP has contacted trauma surgery  Admission status: Observation, Telemetry bed   Kristopher Oppenheim, DO [facility] 02/18/2022, 1:07 AM

## 2022-02-18 NOTE — Assessment & Plan Note (Signed)
Trauma surgery to address.

## 2022-02-18 NOTE — ED Notes (Addendum)
Trauma Event Note    Reason for Call : Assistance for Chest tube placement - left side   Initial Focused Assessment: - Pt sleepy but easily aroused and oriented - CBG currently 26 - actively being treated by primary RN. - VSS - c/o no current pain - intermittent left sided rib pain with inspiration  Interventions: - Procedure explained to patient and consent signed. - Timeout performed, site cleaned with chloraprep, pt draped, sterile field maintained throughout procedure. - Lidocaine injected to L chest by Dr. Zenia Resides - Lacinda Axon pigtail placed to L lateral chest by Dr. Zenia Resides - Pigtail sutured in place, covered with gauze and taped securely. - [place] hooked to -20cm H20 wall suction  - Positive whoosh of air and sanguineous fluid return - CXR ordered - 11mg fentanyl given  Plan of Care: - Obtain CXR - Admit to hospitalist - med-surg - F/u Chest X-rays to ensure re-inflation of lung - Trauma to follow for pneumo and chest tube maintenance.   Event Summary: Pt presented as a trama [facility]  Pt had a fall where he sustained L rib fractures 8-9 and a left pneumothorax.  Pt also sustained an L1 superior endplate compression fracture (neurosurg following) Pt is also having glycemic control issues - hospitalist managing this.   Last imported Vital Signs BP 139/77 (BP Location: Right Arm)   Pulse 69   Temp 97.7 F (36.5 C) (Oral)   Resp 13   Ht '5\' 10"'$  (1.778 m)   Wt 68.9 kg   SpO2 100%   BMI 21.81 kg/m   Trending CBC Recent Labs    02/17/22 1918 02/18/22 0015 02/18/22 0314  WBC 4.3 3.4* 3.1*  HGB 10.0* 9.5* 9.6*  HCT 30.9* 29.4* 29.6*  PLT 269 234 221    Trending Coag's Recent Labs    02/17/22 2119  APTT 26  INR 1.1    Trending BMET Recent Labs    02/18/22 0015 02/18/22 0314 02/18/22 0844  NA 138 139 138  K 5.3* 5.4* 4.8  CL 107 106 108  CO2 21* 17* 23  BUN 81* 79* 70*  CREATININE 4.10* 3.67* 3.05*  GLUCOSE 101* 70 36*    Zahrah Sutherlin W   Trauma Response RN  Please call TRN at [phone] further assistance.

## 2022-02-18 NOTE — Progress Notes (Signed)
Imaging reviewed, left pneumothorax has been slowly increasing in size. Decision made to place a left chest tube. I reviewed the procedure details at bedside with the patient and consent was obtained.

## 2022-02-18 NOTE — Assessment & Plan Note (Signed)
Hold ACEI during ARF.

## 2022-02-18 NOTE — ED Notes (Signed)
Paged Dr. Thereasa Solo ([facility]) for RN [place]

## 2022-02-18 NOTE — Subjective & Objective (Signed)
CC: transferred for trauma evaluation HPI: 72 year old African-American male history of cocaine abuse, type 1 diabetes, multiple myeloma, CKD stage IIIa baseline creatinine 1.5-1.7, presents as a transfer from [facility] ER due to traumatic pneumothorax.  Patient states that he was doing some cocaine last week.  This is a chronic drug use problem for him.  He states he got out of bed last night.  He stumbled around and fell in his kitchen.  He states he fell about 3 times.  He struck the left side of his chest on his stove.  He had immediate pain.  I called 911.  He is brought to the [facility] ER for evaluation. he is found to have traumatic pneumothorax.  Also was noted to have hyperglycemia along with acute renal failure.  EDP was uncomfortable managing patient's traumatic pneumothorax and and sent the [facility] to [name] for evaluation.  Still awaiting trauma evaluation. Patient is noted to have acute renal failure on his labs at [facility]. His baseline creatinine is approximately 1.5-1.7.  In the ER, he was noted to have a creatinine of 4.0.  He is also noted to be persistently hyperglycemic.  He required a dextrose infusion to keep his blood sugar elevated.  [facility] hospitalist contacted for admission due to his acute renal failure and hypoglycemia.

## 2022-02-18 NOTE — Consult Note (Signed)
Admitting Physician: [place]  Service: Trauma Surgery  CC: [place]  Subjective   Mechanism of Injury: GENERAL WEARING is an 71 y.o. male who presented as a trauma transfer from [facility].  He falls a lot, can't give [place] a clear history.  He was trying to get some water.  Past Medical History:  Diagnosis Date   DKA (diabetic ketoacidoses) 04/06/2016   Hypercholesteremia    Hypertension     Past Surgical History:  Procedure Laterality Date   COLONOSCOPY WITH PROPOFOL N/A 02/01/2020   Procedure: COLONOSCOPY WITH PROPOFOL;  Surgeon: Lin Landsman, MD;  Location: [facility] ENDOSCOPY;  Service: Gastroenterology;  Laterality: N/A;   ESOPHAGOGASTRODUODENOSCOPY  02/01/2020   Procedure: ESOPHAGOGASTRODUODENOSCOPY (EGD);  Surgeon: Lin Landsman, MD;  Location: [facility] ENDOSCOPY;  Service: Gastroenterology;;   KNEE SURGERY Right    Torn meniscus   KNEE SURGERY Left     Family History  Problem Relation Age of Onset   Heart attack Father    Hypertension Sister    Cancer Sister     Social:  reports that he has never smoked. He has never used smokeless tobacco. He reports that he does not currently use alcohol. He reports current drug use. Drugs: Marijuana and "Crack" cocaine.  Allergies:  Allergies  Allergen Reactions   Penicillins Other (See Comments)    Has patient had a PCN reaction causing immediate rash, facial/tongue/throat swelling, SOB or lightheadedness with hypotension: No Has patient had a PCN reaction causing severe rash involving mucus membranes or skin necrosis: No Has patient had a PCN reaction that required hospitalization No Has patient had a PCN reaction occurring within the last 10 years: No If all of the above answers are "NO", then may proceed with Cephalosporin use.  Other reaction(s): Other (see comments) Other reaction(s): Other (See Comments) Has patient had a PCN reaction causing immediate rash, facial/tongue/throat swelling, SOB or lightheadedness  with hypotension: No Has patient had a PCN reaction causing severe rash involving mucus membranes or skin necrosis: No Has patient had a PCN reaction that required hospitalization No Has patient had a PCN reaction occurring within the last 10 years: No If all of the above answers are "NO", then may proceed with Cephalosporin use. Has patient had a PCN reaction causing immediate rash, facial/tongue/throat swelling, SOB or lightheadedness with hypotension: No Has patient had a PCN reaction causing severe rash involving mucus membranes or skin necrosis: No Has patient had a PCN reaction that required hospitalization No Has patient had a PCN reaction occurring within the last 10 years: No If all of the above answers are "NO", then may proceed with Cephalosporin use.    Medications: Current Outpatient Medications  Medication Instructions   acyclovir (ZOVIRAX) 400 mg, Oral, 2 times daily   Alcohol Swabs (B-D SINGLE USE SWABS REGULAR) PADS Use to check blood sugar four times daily for type 1 diabetes   amLODipine (NORVASC) 10 mg, Oral, Daily   ASPIRIN 81 PO 81 mg, Oral, Daily   atorvastatin (LIPITOR) 80 mg, Oral, Daily at bedtime   Baclofen 5 mg, Oral, 3 times daily PRN   Blood Glucose Calibration (TRUE METRIX LEVEL 1) Low SOLN Use to check blood sugar four times daily for type 1 diabetes   clopidogrel (PLAVIX) 75 MG tablet Oral   dicyclomine (BENTYL) 20 mg, Oral, 3 times daily with meals PRN   Docusate Sodium (DSS) 100 MG CAPS Oral   Farxiga 10 mg, Oral, Daily   gabapentin (NEURONTIN) 300 MG  capsule Take 300 mg by mouth in the morning and 600 mg at night.   Insulin Syringe-Needle U-100 (INSULIN SYRINGE 1CC/31GX5/16") 31G X 5/16" 1 ML MISC For insulin injections up to 4 times daily   lenalidomide (REVLIMID) 10 MG capsule TAKE 1 CAPSULE BY MOUTH DAILY FOR 14 DAYS, THEN HOLD FOR 7 DAYS. REPEAT EVERY 21 DAYS   lisinopril (ZESTRIL) 10 mg, Oral, Daily   NOVOLOG 100 UNIT/ML injection INJECT UP TO 14  UNITS THREE TIMES DAILY BEFORE MEALS ACCORDING TO SLIDING SCALE   ondansetron (ZOFRAN) 8 mg, 2 times daily PRN   tamsulosin (FLOMAX) 0.4 MG CAPS capsule Take by mouth.   Tresiba FlexTouch 20 Units, Subcutaneous, Daily, Home dose.    Objective   Primary Survey: Blood pressure 123/85, pulse 90, temperature 98.4 F (36.9 C), temperature source Oral, resp. rate 16, height '5\' 10"'$  (1.778 m), weight 68.9 kg, SpO2 97 %. Airway: Patent, protecting airway Breathing: Bilateral breath sounds, breathing spontaneously Circulation: Stable, Palpable peripheral pulses Disability: Moving all extremities,   GCS Eyes: 4 - Eyes open spontaneously  GCS Verbal: 5 - Oriented  GCS Motor: 6 - Obeys commands for movement  GCS 15  Environment/Exposure: Warm, dry  Secondary Survey: Head: Normocephalic, atraumatic Neck: Full range of motion without pain, no midline tenderness Chest:  tenderness, crepitus over left rib fractures Abdomen: Soft, non-tender, non-distended Upper Extremities: Strength and sensation intact, palpable peripheral pulses Lower extremities: Strength and sensation intact, palpable peripheral pulses Back:  Lumbar tenderness Rectal:  Deferred Psych: Normal mood and affect  Results for orders placed or performed during the hospital encounter of 02/17/22 (from the past 24 hour(s))  CBC with Differential     Status: Abnormal   Collection Time: 02/18/22 12:15 AM  Result Value Ref Range   WBC 3.4 (L) 4.0 - 10.5 K/uL   RBC 3.06 (L) 4.22 - 5.81 MIL/uL   Hemoglobin 9.5 (L) 13.0 - 17.0 g/dL   HCT 29.4 (L) 39.0 - 52.0 %   MCV 96.1 80.0 - 100.0 fL   MCH 31.0 26.0 - 34.0 pg   MCHC 32.3 30.0 - 36.0 g/dL   RDW 16.6 (H) 11.5 - 15.5 %   Platelets 234 150 - 400 K/uL   nRBC 0.0 0.0 - 0.2 %   Neutrophils Relative % 75 %   Neutro Abs 2.6 1.7 - 7.7 K/uL   Lymphocytes Relative 13 %   Lymphs Abs 0.5 (L) 0.7 - 4.0 K/uL   Monocytes Relative 11 %   Monocytes Absolute 0.4 0.1 - 1.0 K/uL   Eosinophils  Relative 1 %   Eosinophils Absolute 0.0 0.0 - 0.5 K/uL   Basophils Relative 0 %   Basophils Absolute 0.0 0.0 - 0.1 K/uL   Immature Granulocytes 0 %   Abs Immature Granulocytes 0.01 0.00 - 0.07 K/uL  POC CBG, ED     Status: None   Collection Time: 02/18/22 12:16 AM  Result Value Ref Range   Glucose-Capillary 97 70 - 99 mg/dL    Imaging Orders  No imaging studies ordered today     Assessment and Plan   CALAHAN PAK is an 71 y.o. male who presented as a trauma transfer from [facility] after a fall.  Injuries: Left pneumothorax - Pain control, repeat CXR, pulmonary toilet L1 superior endplate compression fracture - pain control, neurosurgery consult in AM L8,9 rib fractures - pain control, pulmonary toilet  Multiple medical issues - medicine team admitting, appreciate assistance  FEN - Reg VTE - Lovenox  and Sequential Compression Devices ID - None  Dispo - Med-Surg Floor    Felicie Morn, [facility] Use [email or web address] to contact on call provider  New Patient Billing: [phone] - Moderate MDM

## 2022-02-18 NOTE — Inpatient Diabetes Management (Addendum)
Inpatient Diabetes Program Recommendations  AACE/ADA: New Consensus Statement on Inpatient Glycemic Control (2015)  Target Ranges:  Prepandial:   less than 140 mg/dL      Peak postprandial:   less than 180 mg/dL (1-2 hours)      Critically ill patients:  140 - 180 mg/dL   Lab Results  Component Value Date   GLUCAP 97 02/18/2022   HGBA1C 8.9 (H) 11/27/2021    Latest Reference Range & Units 02/18/22 00:15 02/18/22 03:14  Sodium 135 - 145 mmol/L 138 139  Potassium 3.5 - 5.1 mmol/L 5.3 (H) 5.4 (H)  Chloride 98 - 111 mmol/L 107 106  CO2 22 - 32 mmol/L 21 (L) 17 (L)  Glucose 70 - 99 mg/dL 101 (H) 70  BUN 8 - 23 mg/dL 81 (H) 79 (H)  Creatinine 0.61 - 1.24 mg/dL 4.10 (H) 3.67 (H)  Calcium 8.9 - 10.3 mg/dL 8.1 (L) 8.4 (L)  Anion gap 5 - '15  10 16 '$ (H)   Diabetes history: DM1 Outpatient Diabetes medications: Tresiba 20 units qd, Novolog up to 14 units tid meal coverage (chart lists not taking), Farxiga 10 mg. Current orders for Inpatient glycemic control: None  Inpatient Diabetes Program Recommendations:   Patient currently in ED from [place]. Patient has type 1 diabetes and last office visit with Dr. Gabriel Carina was 12-04-21. Please note increases in CO2 and anion gap. Patient on [place] @ home. Please consider: Low insulin dose along with dextrose.  When off of IV insulin will need approx 80% home insulin basal dose =16 units qd ([place]glee) When eating, Add Novolog meal coverage 3 units if eats 50% meal  Thank you, [name]. Nicholas Colvin, RN, MSN, CDE  Diabetes Coordinator Inpatient Glycemic Control Team Team Pager [phone] (8am-5pm) 02/18/2022 8:32 AM

## 2022-02-18 NOTE — Assessment & Plan Note (Signed)
Chronic. 

## 2022-02-18 NOTE — ED Notes (Signed)
3 RNs attempted to place foley in patient. Foley unable to be placed. PA- Lisa at bedside to observe and suggested attempting with smaller foley. Will re-attempt with a 12Fr.

## 2022-02-18 NOTE — ED Notes (Signed)
Ivin Booty (friend) would like to be called with status update on pt. Phone is [phone]

## 2022-02-18 NOTE — Assessment & Plan Note (Signed)
Chronic. Hold insulin while hypoglycemic requiring dextrose infusion.

## 2022-02-18 NOTE — Progress Notes (Signed)
Pt arrived to [facility] room 26 via stretcher from ED. Report received from [facility]. Pt A&O x4, on  2L [place], IV x2, left chest tube on -20 sx. Pt oriented to unit and room. Left AC IV accidentally removed by pt. Pt placed on continuous tele. VSS. Bed alarm turned on. Call bell given to pt.

## 2022-02-18 NOTE — TOC CAGE-AID Note (Signed)
Transition of Care [facility]) - CAGE-AID Screening   Patient Details  Name: Nicholas Weiss MRN: [id number] Date of Birth: 1950/07/07  [facility] CM/SW Contact:    Clovis Cao, RN Phone Number: [phone] 02/18/2022, 5:08 PM   Clinical Narrative: Pt here after sustaining rib fractures and a pneumothorax post fall. Pt states he does not drink alcohol but he does do cocaine and marijuana.  Pt does not want resources at this time.  Screening complete.   CAGE-AID Screening:    Have You Ever Felt You Ought to Cut Down on Your Drinking or Drug Use?: No Have People Annoyed You By Critizing Your Drinking Or Drug Use?: No Have You Felt Bad Or Guilty About Your Drinking Or Drug Use?: No Have You Ever Had a Drink or Used Drugs First Thing In The Morning to Steady Your Nerves or to Get Rid of a Hangover?: No CAGE-AID Score: 0  Substance Abuse Education Offered: Yes

## 2022-02-18 NOTE — Progress Notes (Signed)
Orthopedic Tech Progress Note Patient Details:  Nicholas Weiss 12-29-1950 [id number]  Patient was in a lot of pain when I brought back brace, made some adjustments and left at bedside   Ortho Devices Type of Ortho Device: Thoracolumbar corset (TLSO) Ortho Device/Splint Location: BACK Ortho Device/Splint Interventions: Ordered, Adjustment   Post Interventions Patient Tolerated: Other (comment) Instructions Provided: Poper ambulation with device, Care of device  Janit Pagan 02/18/2022, 1:25 PM

## 2022-02-18 NOTE — Assessment & Plan Note (Addendum)
Likely a combination of ARF and cocaine abuse. Echo at end of August 2023 showed normal LVEF. Continue with telemetry

## 2022-02-18 NOTE — ED Notes (Signed)
Dr. Thermon Leyland is aware of pt's arrival.

## 2022-02-18 NOTE — Consult Note (Signed)
CC: L1 compression fracture  HPI:     Patient is a 71 y.o. male with DM, CKD, polysubstance abuse presents after fall, was found to have an L1 compression fracture in addition to PTX.  On questioning, he has some mild/moderate mid back pain.  He has chronic numbness in his feet, no neurologic changes.    Patient Active Problem List   Diagnosis Date Noted   Acute renal failure superimposed on stage 3 chronic kidney disease, unspecified acute renal failure type, unspecified whether stage 3a or 3b CKD ([place]) 02/18/2022   Traumatic pneumothorax 02/18/2022   Multiple fractures of ribs, left side, initial encounter for closed fracture 02/18/2022   Pneumothorax, closed, traumatic 02/18/2022   Stroke (cerebrum) ([place]) 11/07/2021   Multiple myeloma ([place]) 09/29/2021   Multiple myeloma not having achieved remission ([place]) 09/29/2021   Chronic hyponatremia 08/18/2021   History of CVA (cerebrovascular accident) 08/13/2021   Syncope and collapse 08/09/2021   Hepatic lesion 08/05/2021   Dyslipidemia 05/08/2021   History of substance abuse ([place]) 12/09/2020   Cocaine abuse ([place]) 11/30/2020   Anemia of chronic disease 11/08/2020   Muscle twitching 09/12/2020   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   Pre-syncope 08/01/2020   Heart block AV first degree 08/01/2020   Anemia of chronic kidney failure, stage 3 (moderate) ([place]) 05/28/2020   PUD (peptic ulcer disease)    Esophageal dysphagia    Encounter for screening colonoscopy    Generalized weakness 07/28/2019   Hypoglycemia 04/27/2019   Unresponsiveness 04/27/2019   Lung nodule 04/07/2019   Uncontrolled type 1 diabetes mellitus    Acute renal failure with acute tubular necrosis superimposed on stage 3a chronic kidney disease ([place])    AKI (acute kidney injury) ([place]) 04/05/2019   CKD (chronic kidney disease) stage 3, GFR 30-59 ml/min ([place]) 04/05/2019   Hypercholesterolemia 01/24/2016   Essential (primary) hypertension 06/07/2006   Type 1  diabetes mellitus with diabetic neuropathy, unspecified ([place]) 05/31/2006   Past Medical History:  Diagnosis Date   DKA (diabetic ketoacidoses) 04/06/2016   Hypercholesteremia    Hypertension     Past Surgical History:  Procedure Laterality Date   COLONOSCOPY WITH PROPOFOL N/A 02/01/2020   Procedure: COLONOSCOPY WITH PROPOFOL;  Surgeon: Lin Landsman, MD;  Location: [facility] ENDOSCOPY;  Service: Gastroenterology;  Laterality: N/A;   ESOPHAGOGASTRODUODENOSCOPY  02/01/2020   Procedure: ESOPHAGOGASTRODUODENOSCOPY (EGD);  Surgeon: Lin Landsman, MD;  Location: [facility] ENDOSCOPY;  Service: Gastroenterology;;   KNEE SURGERY Right    Torn meniscus   KNEE SURGERY Left     (Not in a hospital admission)  Allergies  Allergen Reactions   Penicillins Other (See Comments)    Has patient had a PCN reaction causing immediate rash, facial/tongue/throat swelling, SOB or lightheadedness with hypotension: No Has patient had a PCN reaction causing severe rash involving mucus membranes or skin necrosis: No Has patient had a PCN reaction that required hospitalization No Has patient had a PCN reaction occurring within the last 10 years: No If all of the above answers are "NO", then may proceed with Cephalosporin use.  Other reaction(s): Other (see comments) Other reaction(s): Other (See Comments) Has patient had a PCN reaction causing immediate rash, facial/tongue/throat swelling, SOB or lightheadedness with hypotension: No Has patient had a PCN reaction causing severe rash involving mucus membranes or skin necrosis: No Has patient had a PCN reaction that required hospitalization No Has patient had a PCN reaction occurring within the last 10 years: No If all of the above answers are "NO",  then may proceed with Cephalosporin use. Has patient had a PCN reaction causing immediate rash, facial/tongue/throat swelling, SOB or lightheadedness with hypotension: No Has patient had a PCN reaction causing severe  rash involving mucus membranes or skin necrosis: No Has patient had a PCN reaction that required hospitalization No Has patient had a PCN reaction occurring within the last 10 years: No If all of the above answers are "NO", then may proceed with Cephalosporin use.    Social History   Tobacco Use   Smoking status: Never   Smokeless tobacco: Never  Substance Use Topics   Alcohol use: Not Currently    Alcohol/week: 0.0 - 1.0 standard drinks of alcohol    Comment: "once every 2 months"    Family History  Problem Relation Age of Onset   Heart attack Father    Hypertension Sister    Cancer Sister      Review of Systems Pertinent items noted in HPI and remainder of comprehensive ROS otherwise negative.  Objective:   Patient Vitals for the past 8 hrs:  BP Temp Temp src Pulse Resp SpO2  02/18/22 0856 139/77 97.7 F (36.5 C) Oral 69 13 100 %  02/18/22 0630 123/79 -- -- 70 11 100 %  02/18/22 0600 130/79 -- -- 72 13 100 %  02/18/22 0530 118/88 -- -- 72 16 100 %  02/18/22 0500 (!) 147/92 -- -- 74 12 100 %  02/18/22 0353 -- 98.3 F (36.8 C) Oral -- -- --  02/18/22 0346 126/85 -- -- 75 14 100 %   I/O last 3 completed shifts: In: -  Out: 400 [Urine:400] Total I/O In: 606.8 [I.V.:606.8] Out: -       General : Alert, cooperative, no distress, appears stated age   Head:  Normocephalic/atraumatic    Eyes: PERRL, conjunctiva/corneas clear, EOM's intact. Fundi could not be visualized Neck: Supple Chest:  Respirations unlabored Chest wall: no tenderness or deformity Heart: Regular rate and rhythm Abdomen: Soft, nontender and nondistended Extremities: warm and well-perfused Skin: normal turgor, color and texture Neurologic:  Alert, oriented x 3.  Eyes open spontaneously. PERRL, EOMI, VFC, no facial droop. V1-3 intact.  No dysarthria, tongue protrusion symmetric.  CNII-XII intact. Normal strength, sensation and reflexes throughout.  No pronator drift, full strength in legs        Data ReviewCBC:  Lab Results  Component Value Date   WBC 3.1 (L) 02/18/2022   RBC 3.04 (L) 02/18/2022   BMP:  Lab Results  Component Value Date   GLUCOSE 70 02/18/2022   GLUCOSE 239 (H) 10/11/2012   CO2 17 (L) 02/18/2022   CO2 26 10/11/2012   BUN 79 (H) 02/18/2022   BUN 24 10/03/2020   BUN 17 10/11/2012   CREATININE 3.67 (H) 02/18/2022   CREATININE 1.36 (H) 10/11/2012   CALCIUM 8.4 (L) 02/18/2022   CALCIUM 8.7 10/11/2012   Radiology review:   CT L-spine reviewed.  Mild superior endplate compression fracture of L1.  No significant kyphosis, no retropulstion  Assessment:   Principal Problem:   Acute renal failure superimposed on stage 3 chronic kidney disease, unspecified acute renal failure type, unspecified whether stage 3a or 3b CKD (HCC) Active Problems:   Type 1 diabetes mellitus with diabetic neuropathy, unspecified ([place])   Essential (primary) hypertension   Hypoglycemia   Cocaine abuse ([place])   Syncope and collapse   Multiple myeloma (HCC)   Traumatic pneumothorax   Multiple fractures of ribs, left side, initial encounter for closed fracture  Pneumothorax, closed, traumatic  L1 compression fracture Plan:  -No operative intervention indicated - recommend TLSO brace when OOB - can f/u in neurosurgery clinic in 6 weeks with an x-ray

## 2022-02-18 NOTE — Assessment & Plan Note (Signed)
UDS persistently positive for cocaine. Pt admits to using cocaine.

## 2022-02-18 NOTE — Assessment & Plan Note (Signed)
Admit to observation. Continue with IVF. Place foley for accurate I&Os

## 2022-02-18 NOTE — Assessment & Plan Note (Signed)
Had persistent hypoglycemia while at [facility] ED. Continue with glucose infusion. Likely due to ARF. Given his type 1 DM, cannot hold SQ insulin for more than 24 hours. Will need to restart insulin in the next 12-24 hours.

## 2022-02-18 NOTE — ED Notes (Signed)
ED TO INPATIENT HANDOFF REPORT  ED Nurse Name and Phone #: Andee Poles [phone]  S Name/Age/Gender Halina Maidens 71 y.o. male Room/Bed: 004C/004C  Code Status   Code Status: Full Code  Home/SNF/Other Home Patient oriented to: self, place, time, and situation Is this baseline? Yes   Triage Complete: Triage complete  Chief Complaint Acute renal failure superimposed on stage 3 chronic kidney disease, unspecified acute renal failure type, unspecified whether stage 3a or 3b CKD ([place]) [N17.9, N18.30] Pneumothorax, closed, traumatic [S27.0XXA]  Triage Note BIB [facility] from [facility], patient had a fall. Per initial Chest CT; broken ribs on L side and pneumo on L side. Did repeat Chest CT and pneumo grew in size.   Initial CBG=38, gave amp d50. Went up to 150. Rechecked later and fell back to 28. Given a 2nd amp of D50. Presents to ED on D10 drip '@100m'$ /hr. CBG now= 133.   18G L UA.   Allergies Allergies  Allergen Reactions   Penicillins Other (See Comments)    Has patient had a PCN reaction causing immediate rash, facial/tongue/throat swelling, SOB or lightheadedness with hypotension: No Has patient had a PCN reaction causing severe rash involving mucus membranes or skin necrosis: No Has patient had a PCN reaction that required hospitalization No Has patient had a PCN reaction occurring within the last 10 years: No If all of the above answers are "NO", then may proceed with Cephalosporin use.  Other reaction(s): Other (see comments) Other reaction(s): Other (See Comments) Has patient had a PCN reaction causing immediate rash, facial/tongue/throat swelling, SOB or lightheadedness with hypotension: No Has patient had a PCN reaction causing severe rash involving mucus membranes or skin necrosis: No Has patient had a PCN reaction that required hospitalization No Has patient had a PCN reaction occurring within the last 10 years: No If all of the above answers are "NO", then may proceed  with Cephalosporin use. Has patient had a PCN reaction causing immediate rash, facial/tongue/throat swelling, SOB or lightheadedness with hypotension: No Has patient had a PCN reaction causing severe rash involving mucus membranes or skin necrosis: No Has patient had a PCN reaction that required hospitalization No Has patient had a PCN reaction occurring within the last 10 years: No If all of the above answers are "NO", then may proceed with Cephalosporin use.    Level of Care/Admitting Diagnosis ED Disposition     ED Disposition  Admit   Condition  --   [place]: [place] [[id number]]  Level of Care: Progressive [102]  Admit to Progressive based on following criteria: CARDIOVASCULAR & THORACIC of moderate stability with acute coronary syndrome symptoms/low risk myocardial infarction/hypertensive urgency/arrhythmias/heart failure potentially compromising stability and stable post cardiovascular intervention patients.  May admit patient to [name] or [facility] if equivalent level of care is available:: No  Covid Evaluation: Confirmed COVID Negative  Diagnosis: Pneumothorax, closed, traumatic [[id number]]  Admitting Physician: Thereasa Solo, JEFFREY T [[id number]]  Attending Physician: Thereasa Solo, JEFFREY T [[id number]]  Certification:: I certify this patient will need inpatient services for at least 2 midnights  Estimated Length of Stay: 4          B Medical/Surgery History Past Medical History:  Diagnosis Date   DKA (diabetic ketoacidoses) 04/06/2016   Hypercholesteremia    Hypertension    Past Surgical History:  Procedure Laterality Date   COLONOSCOPY WITH PROPOFOL N/A 02/01/2020   Procedure: COLONOSCOPY WITH PROPOFOL;  Surgeon: Lin Landsman, MD;  Location: [facility];  Service: Gastroenterology;  Laterality: N/A;   ESOPHAGOGASTRODUODENOSCOPY  02/01/2020   Procedure: ESOPHAGOGASTRODUODENOSCOPY (EGD);  Surgeon: Lin Landsman, MD;  Location[facility]  ENDOSCOPY;  Service: Gastroenterology;;   KNEE SURGERY Right    Torn meniscus   KNEE SURGERY Left      A IV Location/Drains/Wounds Patient Lines/Drains/Airways Status     Active Line/Drains/Airways     Name Placement date Placement time Site Days   Peripheral IV 02/17/22 18 G Left Arm 02/17/22  2117  Arm  1   Peripheral IV 02/18/22 20 G Right Wrist 02/18/22  0023  Wrist  less than 1   Chest Tube 1 Lateral;Left Pleural 14 Fr. 02/18/22  0910  Pleural  less than 1   Urethral Catheter Arielle L RN Straight-tip 14 Fr. 02/18/22  0349  Straight-tip  less than 1            Intake/Output Last 24 hours  Intake/Output Summary (Last 24 hours) at 02/18/2022 1521 Last data filed at 02/18/2022 1200 Gross per 24 hour  Intake 1606.82 ml  Output 1500 ml  Net 106.82 ml    Labs/Imaging Results for orders placed or performed during the hospital encounter of 02/17/22 (from the past 48 hour(s))  CBG monitoring, ED     Status: Abnormal   Collection Time: 02/17/22 11:38 PM  Result Value Ref Range   Glucose-Capillary 133 (H) 70 - 99 mg/dL    Comment: Glucose reference range applies only to samples taken after fasting for at least 8 hours.  CBC with Differential     Status: Abnormal   Collection Time: 02/18/22 12:15 AM  Result Value Ref Range   WBC 3.4 (L) 4.0 - 10.5 K/uL   RBC 3.06 (L) 4.22 - 5.81 MIL/uL   Hemoglobin 9.5 (L) 13.0 - 17.0 g/dL   HCT 29.4 (L) 39.0 - 52.0 %   MCV 96.1 80.0 - 100.0 fL   MCH 31.0 26.0 - 34.0 pg   MCHC 32.3 30.0 - 36.0 g/dL   RDW 16.6 (H) 11.5 - 15.5 %   Platelets 234 150 - 400 K/uL   nRBC 0.0 0.0 - 0.2 %   Neutrophils Relative % 75 %   Neutro Abs 2.6 1.7 - 7.7 K/uL   Lymphocytes Relative 13 %   Lymphs Abs 0.5 (L) 0.7 - 4.0 K/uL   Monocytes Relative 11 %   Monocytes Absolute 0.4 0.1 - 1.0 K/uL   Eosinophils Relative 1 %   Eosinophils Absolute 0.0 0.0 - 0.5 K/uL   Basophils Relative 0 %   Basophils Absolute 0.0 0.0 - 0.1 K/uL   Immature Granulocytes 0 %    Abs Immature Granulocytes 0.01 0.00 - 0.07 K/uL    Comment: Performed at [facility], [address], [place], [place] [address]  Comprehensive metabolic panel     Status: Abnormal   Collection Time: 02/18/22 12:15 AM  Result Value Ref Range   Sodium 138 135 - 145 mmol/L   Potassium 5.3 (H) 3.5 - 5.1 mmol/L   Chloride 107 98 - 111 mmol/L   CO2 21 (L) 22 - 32 mmol/L   Glucose, Bld 101 (H) 70 - 99 mg/dL    Comment: Glucose reference range applies only to samples taken after fasting for at least 8 hours.   BUN 81 (H) 8 - 23 mg/dL   Creatinine, Ser 4.10 (H) 0.61 - 1.24 mg/dL   Calcium 8.1 (L) 8.9 - 10.3 mg/dL   Total Protein 6.9 6.5 - 8.1 g/dL  Albumin 3.4 (L) 3.5 - 5.0 g/dL   AST 40 15 - 41 U/L   ALT 55 (H) 0 - 44 U/L   Alkaline Phosphatase 58 38 - 126 U/L   Total Bilirubin 0.5 0.3 - 1.2 mg/dL   GFR, Estimated 15 (L) >60 mL/min    Comment: (NOTE) Calculated using the CKD-EPI Creatinine Equation (2021)    Anion gap 10 5 - 15    Comment: Performed at [facility] [address] [place], [place] [address]  POC CBG, ED     Status: None   Collection Time: 02/18/22 12:16 AM  Result Value Ref Range   Glucose-Capillary 97 70 - 99 mg/dL    Comment: Glucose reference range applies only to samples taken after fasting for at least 8 hours.  Comprehensive metabolic panel     Status: Abnormal   Collection Time: 02/18/22  3:14 AM  Result Value Ref Range   Sodium 139 135 - 145 mmol/L   Potassium 5.4 (H) 3.5 - 5.1 mmol/L   Chloride 106 98 - 111 mmol/L   CO2 17 (L) 22 - 32 mmol/L   Glucose, Bld 70 70 - 99 mg/dL    Comment: Glucose reference range applies only to samples taken after fasting for at least 8 hours.   BUN 79 (H) 8 - 23 mg/dL   Creatinine, Ser 3.67 (H) 0.61 - 1.24 mg/dL   Calcium 8.4 (L) 8.9 - 10.3 mg/dL   Total Protein 6.7 6.5 - 8.1 g/dL   Albumin 3.3 (L) 3.5 - 5.0 g/dL   AST 43 (H) 15 - 41 U/L   ALT 56 (H) 0 - 44 U/L   Alkaline Phosphatase 54 38 - 126 U/L    Total Bilirubin 0.7 0.3 - 1.2 mg/dL   GFR, Estimated 17 (L) >60 mL/min    Comment: (NOTE) Calculated using the CKD-EPI Creatinine Equation (2021)    Anion gap 16 (H) 5 - 15    Comment: Performed at [facility], [name] [address], [place], [place] [address]  CBC with Differential/Platelet     Status: Abnormal   Collection Time: 02/18/22  3:14 AM  Result Value Ref Range   WBC 3.1 (L) 4.0 - 10.5 K/uL   RBC 3.04 (L) 4.22 - 5.81 MIL/uL   Hemoglobin 9.6 (L) 13.0 - 17.0 g/dL   HCT 29.6 (L) 39.0 - 52.0 %   MCV 97.4 80.0 - 100.0 fL   MCH 31.6 26.0 - 34.0 pg   MCHC 32.4 30.0 - 36.0 g/dL   RDW 16.7 (H) 11.5 - 15.5 %   Platelets 221 150 - 400 K/uL   nRBC 0.0 0.0 - 0.2 %   Neutrophils Relative % 66 %   Neutro Abs 2.1 1.7 - 7.7 K/uL   Lymphocytes Relative 21 %   Lymphs Abs 0.6 (L) 0.7 - 4.0 K/uL   Monocytes Relative 12 %   Monocytes Absolute 0.4 0.1 - 1.0 K/uL   Eosinophils Relative 1 %   Eosinophils Absolute 0.0 0.0 - 0.5 K/uL   Basophils Relative 0 %   Basophils Absolute 0.0 0.0 - 0.1 K/uL   Immature Granulocytes 0 %   Abs Immature Granulocytes 0.01 0.00 - 0.07 K/uL    Comment: Performed at [facility], [place] [address] [place],  [address]  Magnesium     Status: Abnormal   Collection Time: 02/18/22  3:14 AM  Result Value Ref Range   Magnesium 2.5 (H) 1.7 - 2.4 mg/dL  Comment: Performed at [facility], [place] [address] [place], [place] [address]  Basic metabolic panel     Status: Abnormal   Collection Time: 02/18/22  8:44 AM  Result Value Ref Range   Sodium 138 135 - 145 mmol/L   Potassium 4.8 3.5 - 5.1 mmol/L   Chloride 108 98 - 111 mmol/L   CO2 23 22 - 32 mmol/L   Glucose, Bld 36 (LL) 70 - 99 mg/dL    Comment: CRITICAL RESULT CALLED TO, READ BACK BY AND VERIFIED WITH BECK,B RN @ [phone] 02/18/22 LEONARD,A Glucose reference range applies only to samples taken after fasting for at least 8 hours.    BUN 70 (H) 8 - 23 mg/dL   Creatinine, Ser 3.05 (H) 0.61 -  1.24 mg/dL   Calcium 8.2 (L) 8.9 - 10.3 mg/dL   GFR, Estimated 22 (L) >60 mL/min    Comment: (NOTE) Calculated using the CKD-EPI Creatinine Equation (2021)    Anion gap 7 5 - 15    Comment: Performed at [facility] [address] [place], [place] [address]  Beta-hydroxybutyric acid     Status: Abnormal   Collection Time: 02/18/22  8:44 AM  Result Value Ref Range   Beta-Hydroxybutyric Acid <0.05 (L) 0.05 - 0.27 mmol/L    Comment: Performed at [facility] [address], [place], [place] [address]  CBG monitoring, ED     Status: Abnormal   Collection Time: 02/18/22  8:59 AM  Result Value Ref Range   Glucose-Capillary 26 (LL) 70 - 99 mg/dL    Comment: Glucose reference range applies only to samples taken after fasting for at least 8 hours.   Comment 1 Notify RN   CBG monitoring, ED     Status: Abnormal   Collection Time: 02/18/22  9:01 AM  Result Value Ref Range   Glucose-Capillary 32 (LL) 70 - 99 mg/dL    Comment: Glucose reference range applies only to samples taken after fasting for at least 8 hours.   Comment 1 Notify RN   CBG monitoring, ED     Status: Abnormal   Collection Time: 02/18/22 10:05 AM  Result Value Ref Range   Glucose-Capillary 117 (H) 70 - 99 mg/dL    Comment: Glucose reference range applies only to samples taken after fasting for at least 8 hours.  Basic metabolic panel     Status: Abnormal   Collection Time: 02/18/22 11:50 AM  Result Value Ref Range   Sodium 137 135 - 145 mmol/L   Potassium 5.6 (H) 3.5 - 5.1 mmol/L    Comment: HEMOLYSIS AT THIS LEVEL MAY AFFECT RESULT   Chloride 108 98 - 111 mmol/L   CO2 22 22 - 32 mmol/L   Glucose, Bld 47 (L) 70 - 99 mg/dL    Comment: Glucose reference range applies only to samples taken after fasting for at least 8 hours.   BUN 65 (H) 8 - 23 mg/dL   Creatinine, Ser 2.60 (H) 0.61 - 1.24 mg/dL   Calcium 8.2 (L) 8.9 - 10.3 mg/dL   GFR, Estimated 26 (L) >60 mL/min    Comment: (NOTE) Calculated using the  CKD-EPI Creatinine Equation (2021)    Anion gap 7 5 - 15    Comment: Performed at [facility] [address] [place], [place] [address]  CBG monitoring, ED     Status: Abnormal   Collection Time: 02/18/22  1:01 PM  Result Value Ref Range  Glucose-Capillary 48 (L) 70 - 99 mg/dL    Comment: Glucose reference range applies only to samples taken after fasting for at least 8 hours.  CBG monitoring, ED     Status: Abnormal   Collection Time: 02/18/22  2:03 PM  Result Value Ref Range   Glucose-Capillary 190 (H) 70 - 99 mg/dL    Comment: Glucose reference range applies only to samples taken after fasting for at least 8 hours.   DG Chest Portable 1 View  Result Date: 02/18/2022 CLINICAL DATA:  LEFT chest tube placement EXAM: PORTABLE CHEST 1 VIEW COMPARISON:  Portable exam 0944 hours compared to 0130 hours FINDINGS: Pigtail LEFT thoracostomy tube identified with significant decrease in previously seen LEFT pneumothorax. Small loculated pneumothorax at medial lower LEFT lung. Normal heart size, mediastinal contours, and pulmonary vascularity. Subsegmental atelectasis RIGHT base, mildly in retrocardiac LEFT lower lobe. No pleural effusion or infiltrate seen. Atherosclerotic calcification aorta. LEFT chest wall emphysema extending into cervical region. Displaced fracture of the posterolateral LEFT ninth rib noted. IMPRESSION: Significant decrease in LEFT pneumothorax following chest tube insertion. Bibasilar atelectasis. Displaced fracture posterolateral LEFT ninth rib. Electronically Signed   By: Lavonia Dana M.D.   On: 02/18/2022 09:56   DG CHEST PORT 1 VIEW  Result Date: 02/18/2022 CLINICAL DATA:  Left pneumothorax. EXAM: PORTABLE CHEST 1 VIEW COMPARISON:  02/17/2022. FINDINGS: The heart size and mediastinal contours are stable. There is atherosclerotic calcification of the aorta. There is redemonstration of a left pneumothorax of approximately 23%, slightly increased from the prior exam.  Subcutaneous emphysema is noted in the supraclavicular, cervical region, axilla, and lateral chest wall on the left. The right lung is clear. The previously described left rib fractures are not well seen on this exam. IMPRESSION: 1. Slightly increased pneumothorax on the left of approximately 23%. 2. Stable subcutaneous emphysema. Electronically Signed   By: Brett Fairy M.D.   On: 02/18/2022 01:43   DG Chest Portable 1 View  Result Date: 02/17/2022 CLINICAL DATA:  Evaluate pneumothorax.  Fall. EXAM: PORTABLE CHEST 1 VIEW COMPARISON:  Chest x-ray 12/06/2021.  Chest CT 02/17/2022. FINDINGS: Small left pneumothorax is present (proximally 20%). This is stable to minimally increased compared to the prior CT given differences in technique. Left chest wall emphysema is again seen. There is no evidence for mediastinal shift or focal lung infiltrate. The cardiomediastinal silhouette is within normal limits. Osseous structures appear stable. IMPRESSION: 1. Small left pneumothorax, stable to minimally increased compared to the prior CT given differences in technique. 2. Left chest wall emphysema. Electronically Signed   By: Ronney Asters M.D.   On: 02/17/2022 21:35   CT ABDOMEN PELVIS WO CONTRAST  Result Date: 02/17/2022 CLINICAL DATA:  Blunt abdominal trauma. Fall getting out of bed. Additional falls in the kitchen. EXAM: CT ABDOMEN AND PELVIS WITHOUT CONTRAST TECHNIQUE: Multidetector CT imaging of the abdomen and pelvis was performed following the standard protocol without IV contrast. RADIATION DOSE REDUCTION: This exam was performed according to the departmental dose-optimization program which includes automated exposure control, adjustment of the mA and/or kV according to patient size and/or use of iterative reconstruction technique. COMPARISON:  Included portions from chest CT earlier today. Abdominopelvic CT 08/10/2021 FINDINGS: Lower chest: Left basilar pneumothorax, increased from chest CT earlier today.  Again seen subcutaneous emphysema in the left chest wall. Left lateral eighth and ninth rib fractures again seen. Hepatobiliary: Lack of contrast limits detailed assessment, allowing for this there is no evidence of injury. Gallbladder physiologically distended, no calcified  stone. No biliary dilatation. Pancreas: Unremarkable unenhanced appearance, no evidence of injury. Spleen: Lack of IV contrast limits detailed assessment. Allowing for this, no evidence of splenic injury. No perisplenic free fluid. Adrenals/Urinary Tract: No adrenal hemorrhage or renal injury identified. Bladder is unremarkable. Stomach/Bowel: No obvious bowel injury on this unenhanced exam. There is mild fluid distention of the duodenum. Scattered fluid within nondilated small bowel. No definite bowel wall thickening or inflammation. Moderate to large colonic stool burden. Normal appendix visualized. Vascular/Lymphatic: Advanced aortic atherosclerosis. There is no perivascular stranding or retroperitoneal fluid to suggest injury. No bulky abdominopelvic adenopathy. Reproductive: Enlarged prostate gland causes mass effect on the bladder base. Other: No free air or free fluid. Musculoskeletal: Unchanged appearance of mild L1 superior endplate compression fracture from CT earlier today. No acute fracture of the pelvis. IMPRESSION: 1. Left basilar pneumothorax, increased from chest CT earlier today. Left lateral eighth and ninth rib fractures again seen. 2. No evidence of acute traumatic injury to the abdomen or pelvis allowing for limitations related to lack of IV contrast. 3. Unchanged appearance of mild L1 superior endplate compression fracture from CT earlier today. 4. Enlarged prostate gland causes mass effect on the bladder base. Aortic Atherosclerosis (ICD10-I70.0). Electronically Signed   By: Keith Rake M.D.   On: 02/17/2022 20:46   CT Head Wo Contrast  Result Date: 02/17/2022 CLINICAL DATA:  Fall. EXAM: CT HEAD WITHOUT CONTRAST  CT MAXILLOFACIAL WITHOUT CONTRAST CT CERVICAL SPINE WITHOUT CONTRAST TECHNIQUE: Multidetector CT imaging of the head, cervical spine, and maxillofacial structures were performed using the standard protocol without intravenous contrast. Multiplanar CT image reconstructions of the cervical spine and maxillofacial structures were also generated. RADIATION DOSE REDUCTION: This exam was performed according to the departmental dose-optimization program which includes automated exposure control, adjustment of the mA and/or kV according to patient size and/or use of iterative reconstruction technique. COMPARISON:  MRI brain dated Aug 10, 2021. CT head dated May 07, 2021. CT cervical spine dated February 27, 2021. FINDINGS: CT HEAD FINDINGS Brain: No evidence of acute infarction, hemorrhage, hydrocephalus, extra-axial collection or mass lesion/mass effect. Stable mild atrophy and chronic microvascular ischemic changes. Vascular: Atherosclerotic vascular calcification of the carotid siphons. No hyperdense vessel. Skull: Normal. Negative for fracture or focal lesion. Other: None. CT MAXILLOFACIAL FINDINGS Osseous: No fracture or mandibular dislocation. No destructive process. Orbits: Negative. No traumatic or inflammatory finding. Sinuses: Tiny retention cysts in the left maxillary sinus. Remaining paranasal sinuses and mastoid air cells are clear. Soft tissues: Negative. CT CERVICAL SPINE FINDINGS Alignment: No traumatic malalignment. Skull base and vertebrae: No acute fracture. No primary bone lesion or focal pathologic process. Soft tissues and spinal canal: No prevertebral fluid or swelling. No visible canal hematoma. Disc levels: Moderate disc height loss and uncovertebral hypertrophy at C5-C6 and C6-C7. Moderate left facet arthropathy at C3-C4. Upper chest: Please see separate CT chest report from same day. Other: None. IMPRESSION: 1. No acute intracranial abnormality. 2. No acute facial fracture. 3. No acute cervical  spine fracture or traumatic malalignment. Electronically Signed   By: Titus Dubin M.D.   On: 02/17/2022 19:19   CT Maxillofacial Wo Contrast  Result Date: 02/17/2022 CLINICAL DATA:  Fall. EXAM: CT HEAD WITHOUT CONTRAST CT MAXILLOFACIAL WITHOUT CONTRAST CT CERVICAL SPINE WITHOUT CONTRAST TECHNIQUE: Multidetector CT imaging of the head, cervical spine, and maxillofacial structures were performed using the standard protocol without intravenous contrast. Multiplanar CT image reconstructions of the cervical spine and maxillofacial structures were also generated. RADIATION DOSE REDUCTION: This  exam was performed according to the departmental dose-optimization program which includes automated exposure control, adjustment of the mA and/or kV according to patient size and/or use of iterative reconstruction technique. COMPARISON:  MRI brain dated Aug 10, 2021. CT head dated May 07, 2021. CT cervical spine dated February 27, 2021. FINDINGS: CT HEAD FINDINGS Brain: No evidence of acute infarction, hemorrhage, hydrocephalus, extra-axial collection or mass lesion/mass effect. Stable mild atrophy and chronic microvascular ischemic changes. Vascular: Atherosclerotic vascular calcification of the carotid siphons. No hyperdense vessel. Skull: Normal. Negative for fracture or focal lesion. Other: None. CT MAXILLOFACIAL FINDINGS Osseous: No fracture or mandibular dislocation. No destructive process. Orbits: Negative. No traumatic or inflammatory finding. Sinuses: Tiny retention cysts in the left maxillary sinus. Remaining paranasal sinuses and mastoid air cells are clear. Soft tissues: Negative. CT CERVICAL SPINE FINDINGS Alignment: No traumatic malalignment. Skull base and vertebrae: No acute fracture. No primary bone lesion or focal pathologic process. Soft tissues and spinal canal: No prevertebral fluid or swelling. No visible canal hematoma. Disc levels: Moderate disc height loss and uncovertebral hypertrophy at C5-C6  and C6-C7. Moderate left facet arthropathy at C3-C4. Upper chest: Please see separate CT chest report from same day. Other: None. IMPRESSION: 1. No acute intracranial abnormality. 2. No acute facial fracture. 3. No acute cervical spine fracture or traumatic malalignment. Electronically Signed   By: Titus Dubin M.D.   On: 02/17/2022 19:19   CT Cervical Spine Wo Contrast  Result Date: 02/17/2022 CLINICAL DATA:  Fall. EXAM: CT HEAD WITHOUT CONTRAST CT MAXILLOFACIAL WITHOUT CONTRAST CT CERVICAL SPINE WITHOUT CONTRAST TECHNIQUE: Multidetector CT imaging of the head, cervical spine, and maxillofacial structures were performed using the standard protocol without intravenous contrast. Multiplanar CT image reconstructions of the cervical spine and maxillofacial structures were also generated. RADIATION DOSE REDUCTION: This exam was performed according to the departmental dose-optimization program which includes automated exposure control, adjustment of the mA and/or kV according to patient size and/or use of iterative reconstruction technique. COMPARISON:  MRI brain dated Aug 10, 2021. CT head dated May 07, 2021. CT cervical spine dated February 27, 2021. FINDINGS: CT HEAD FINDINGS Brain: No evidence of acute infarction, hemorrhage, hydrocephalus, extra-axial collection or mass lesion/mass effect. Stable mild atrophy and chronic microvascular ischemic changes. Vascular: Atherosclerotic vascular calcification of the carotid siphons. No hyperdense vessel. Skull: Normal. Negative for fracture or focal lesion. Other: None. CT MAXILLOFACIAL FINDINGS Osseous: No fracture or mandibular dislocation. No destructive process. Orbits: Negative. No traumatic or inflammatory finding. Sinuses: Tiny retention cysts in the left maxillary sinus. Remaining paranasal sinuses and mastoid air cells are clear. Soft tissues: Negative. CT CERVICAL SPINE FINDINGS Alignment: No traumatic malalignment. Skull base and vertebrae: No acute  fracture. No primary bone lesion or focal pathologic process. Soft tissues and spinal canal: No prevertebral fluid or swelling. No visible canal hematoma. Disc levels: Moderate disc height loss and uncovertebral hypertrophy at C5-C6 and C6-C7. Moderate left facet arthropathy at C3-C4. Upper chest: Please see separate CT chest report from same day. Other: None. IMPRESSION: 1. No acute intracranial abnormality. 2. No acute facial fracture. 3. No acute cervical spine fracture or traumatic malalignment. Electronically Signed   By: Titus Dubin M.D.   On: 02/17/2022 19:19   CT CHEST WO CONTRAST  Result Date: 02/17/2022 CLINICAL DATA:  Left-sided rib pain after a fall. EXAM: CT CHEST WITHOUT CONTRAST TECHNIQUE: Multidetector CT imaging of the chest was performed following the standard protocol without IV contrast. RADIATION DOSE REDUCTION: This exam was performed according  to the departmental dose-optimization program which includes automated exposure control, adjustment of the mA and/or kV according to patient size and/or use of iterative reconstruction technique. COMPARISON:  Chest x-ray dated December 06, 2021. PET-CT dated October 09, 2021. FINDINGS: Cardiovascular: No significant vascular findings. Normal heart size. No pericardial effusion. Coronary, aortic arch, and branch vessel atherosclerotic vascular disease. Mediastinum/Nodes: No enlarged mediastinal or axillary lymph nodes. Thyroid gland, trachea, and esophagus demonstrate no significant findings. Lungs/Pleura: Small left pneumothorax. Mild subsegmental atelectasis in the posterior left lower lobe. Unchanged calcified granuloma along the left major fissure. The right lung is clear. No consolidation or pleural effusion. Upper Abdomen: No acute abnormality. Musculoskeletal: Acute nondisplaced fracture of the left lateral eighth rib. Acute minimally displaced fracture of the left posterolateral ninth rib. Subcutaneous emphysema in the left posterolateral  chest wall extending into the lower neck. Minimal L1 superior endplate compression fracture, new since July. IMPRESSION: 1. Acute fractures of the left eighth and ninth ribs with small left pneumothorax. 2. Minimal L1 superior endplate compression fracture, new since July. Correlate with point tenderness. 3.  Aortic atherosclerosis (ICD10-I70.0). Electronically Signed   By: Titus Dubin M.D.   On: 02/17/2022 19:08    Pending Labs Unresulted Labs (From admission, onward)     Start     Ordered   02/18/22 1517  Basic metabolic panel  (Diabetes Ketoacidosis (DKA))  STAT Now then every 4 hours ,   R      02/18/22 0832            Vitals/Pain Today's Vitals   02/18/22 1245 02/18/22 1301 02/18/22 1302 02/18/22 1426  BP: (!) 153/46     Pulse: 77     Resp: 19     Temp:  98.1 F (36.7 C)    TempSrc:  Oral    SpO2: 100%     Weight:      Height:      PainSc:   2  10-Worst pain ever    Isolation Precautions No active isolations  Medications Medications  amLODipine (NORVASC) tablet 10 mg (10 mg Oral Given 02/18/22 1008)  oxyCODONE (Oxy IR/ROXICODONE) immediate release tablet 5 mg (5 mg Oral Given 02/18/22 1154)  ondansetron (ZOFRAN) tablet 4 mg (has no administration in time range)    Or  ondansetron (ZOFRAN) injection 4 mg (has no administration in time range)  acetaminophen (TYLENOL) tablet 650 mg (650 mg Oral Not Given 02/18/22 1250)  oxyCODONE (Oxy IR/ROXICODONE) immediate release tablet 10 mg (has no administration in time range)  HYDROmorphone (DILAUDID) injection 0.5 mg (0.5 mg Intravenous Given 02/18/22 0247)  methocarbamol (ROBAXIN) 500 mg in dextrose 5 % 50 mL IVPB (has no administration in time range)  prochlorperazine (COMPAZINE) injection 10 mg (has no administration in time range)  simethicone (MYLICON) chewable tablet 80 mg (has no administration in time range)  docusate sodium (COLACE) capsule 100 mg (100 mg Oral Given 02/18/22 1008)  gabapentin (NEURONTIN) capsule  100 mg (100 mg Oral Given 02/18/22 1008)  dextrose 5 % in lactated ringers infusion ( Intravenous New Bag/Given 02/18/22 1137)  dextrose 50 % solution 0-50 mL (50 mLs Intravenous Given 02/18/22 1312)  fentaNYL (SUBLIMAZE) injection 50 mcg (50 mcg Intravenous Given 02/18/22 0912)  dextrose 50 % solution 50 mL (50 mLs Intravenous Given 02/18/22 0914)  lactated ringers bolus 1,000 mL (0 mLs Intravenous Stopped 02/18/22 1056)    Mobility non-ambulatory Moderate fall risk   Focused Assessments    R Recommendations: See Admitting Provider Note  Report given to:   Additional Notes:

## 2022-02-18 NOTE — Progress Notes (Signed)
ERVING SASSANO  UYQ:[id number] DOB: 03/22/1951 DOA: 02/17/2022 PCP: Dion Body, MD    Brief Narrative:  72 year old with a history of cocaine abuse, DM 1, MM, and CKD stage IIIa with baseline creatinine 1.6 who was transferred to [facility] from the [facility] ER after he was found to have a traumatic pneumothorax.  The patient reported getting out of bed and stumbling around his house during which he fell at least 3 times, eventually striking the left side of his chest against his stove.  He called EMS for transport due to severe pain.  While in the ER he was also found to be in acute renal failure with a creatinine of 4.0.  Consultants:  Trauma Surgery  Goals of Care:  Code Status: Full Code   DVT prophylaxis: SCDs  Interim Hx: The patient was examined and interviewed by one of my partners earlier today.  Assessment & Plan:  Acute renal failure on CKD stage IIIa Monitor renal function with volume resuscitation  Uncontrolled DM1 w/ refractory hypoglycemia  Needs insulin as he is DM1 but refractory hypoglycemia has made this difficult -though he was acidotic at presentation this was likely lactic acidosis related to his fall as his beta hydroxybutyric acid was <0.05 -continue high-volume dextrose IV fluid until CBG stabilized then initiate very low-dose long-acting insulin  Hyperkalemia Due to acute renal failure - continue telemetry monitoring for now -dose with Lokelma  Multiple rib fractures left side with traumatic pneumothorax Care as per surgery with chest tube placed 02/18/2022  Multiple falls Felt to be related to cocaine abuse  L1 superior endplate compression fracture Care per trauma surgery  Cocaine abuse  Multiple myeloma  HTN  Family Communication: No family present at time of exam Disposition: From home - may require higher level of care initially but not presently clear   Objective: Blood pressure 123/79, pulse 70, temperature 98.3 F (36.8 C),  temperature source Oral, resp. rate 11, height _0  (1.778 m), weight 68.9 kg, SpO2 100 %.  Intake/Output Summary (Last 24 hours) at 02/18/2022 0755 Last data filed at 02/18/2022 0315 Gross per 24 hour  Intake --  Output 400 ml  Net -400 ml   Filed Weights   02/17/22 2356  Weight: 68.9 kg    Examination: The patient was examined by one of my partners earlier today.  CBC: Recent Labs  Lab 02/17/22 1918 02/18/22 0015 02/18/22 0314  WBC 4.3 3.4* 3.1*  NEUTROABS 2.8 2.6 2.1  HGB 10.0* 9.5* 9.6*  HCT 30.9* 29.4* 29.6*  MCV 93.4 96.1 97.4  PLT 269 234 638   Basic Metabolic Panel: Recent Labs  Lab 02/17/22 1918 02/18/22 0015 02/18/22 0314  NA 140 138 139  K 5.2* 5.3* 5.4*  CL 107 107 106  CO2 23 21* 17*  GLUCOSE 62* 101* 70  BUN 96* 81* 79*  CREATININE 4.98* 4.10* 3.67*  CALCIUM 8.7* 8.1* 8.4*  MG  --   --  2.5*   GFR: Estimated Creatinine Clearance: 18.8 mL/min (A) (by C-G formula based on SCr of 3.67 mg/dL (H)).   Scheduled Meds:  acetaminophen  650 mg Oral Q6H   amLODipine  10 mg Oral Daily   docusate sodium  100 mg Oral BID   gabapentin  300 mg Oral TID   Continuous Infusions:  dextrose 5% lactated ringers 100 mL/hr at 02/18/22 0248   methocarbamol (ROBAXIN) IV       LOS: 0 days   Cherene Altes, MD [facility]  Office  [phone] Pager - Text Page per Shea Evans  If 7PM-7AM, please contact night-coverage per Amion 02/18/2022, 7:55 AM

## 2022-02-18 NOTE — Procedures (Signed)
Chest tube insertion  Date/Time: 02/18/2022 9:37 AM  Performed by: Dwan Bolt, MD Authorized by: Dwan Bolt, MD   Consent:    Consent obtained:  Verbal and written   Consent given by:  Patient Universal protocol:    Procedure explained and questions answered to patient or proxy's satisfaction: yes     Relevant documents present and verified: yes     Test results available and properly labeled: yes     Imaging studies available: yes     Site/side marked: yes   Pre-procedure details:    Skin preparation:  ChloraPrep Anesthesia (see MAR for exact dosages):    Anesthesia method:  Local infiltration   Local anesthetic:  Lidocaine 1% w/o epi Procedure details:    Placement location:  L lateral   Scalpel size:  11   Tube size (Fr):  8   Technique: Seldinger     Tension pneumothorax: no     Tube connected to:  Suction   Drainage characteristics:  Serosanguinous   Suture material:  0 silk   Dressing:  4x4 sterile gauze Post-procedure details:    Post-insertion x-ray findings: tube in good position     Patient tolerance of procedure:  Tolerated well, no immediate complications Comments:     Post-procedure chest XR shows near resolution of pneumothorax.

## 2022-02-19 ENCOUNTER — Inpatient Hospital Stay (HOSPITAL_COMMUNITY): Payer: Medicare Other

## 2022-02-19 LAB — COMPREHENSIVE METABOLIC PANEL
ALT: 44 U/L (ref 0–44)
AST: 37 U/L (ref 15–41)
Albumin: 2.8 g/dL — ABNORMAL LOW (ref 3.5–5.0)
Alkaline Phosphatase: 51 U/L (ref 38–126)
Anion gap: 6 (ref 5–15)
BUN: 51 mg/dL — ABNORMAL HIGH (ref 8–23)
CO2: 19 mmol/L — ABNORMAL LOW (ref 22–32)
Calcium: 7.9 mg/dL — ABNORMAL LOW (ref 8.9–10.3)
Chloride: 109 mmol/L (ref 98–111)
Creatinine, Ser: 2.09 mg/dL — ABNORMAL HIGH (ref 0.61–1.24)
GFR, Estimated: 34 mL/min — ABNORMAL LOW (ref >60)
Glucose, Bld: 150 mg/dL — ABNORMAL HIGH (ref 70–99)
Potassium: 4.8 mmol/L (ref 3.5–5.1)
Sodium: 134 mmol/L — ABNORMAL LOW (ref 135–145)
Total Bilirubin: 0.3 mg/dL (ref 0.3–1.2)
Total Protein: 6 g/dL — ABNORMAL LOW (ref 6.5–8.1)

## 2022-02-19 LAB — CBC
HCT: 27.9 % — ABNORMAL LOW (ref 39.0–52.0)
Hemoglobin: 9 g/dL — ABNORMAL LOW (ref 13.0–17.0)
MCH: 30.8 pg (ref 26.0–34.0)
MCHC: 32.3 g/dL (ref 30.0–36.0)
MCV: 95.5 fL (ref 80.0–100.0)
Platelets: 196 10*3/uL (ref 150–400)
RBC: 2.92 MIL/uL — ABNORMAL LOW (ref 4.22–5.81)
RDW: 16.5 % — ABNORMAL HIGH (ref 11.5–15.5)
WBC: 2.4 10*3/uL — ABNORMAL LOW (ref 4.0–10.5)
nRBC: 0 % (ref 0.0–0.2)

## 2022-02-19 LAB — GLUCOSE, CAPILLARY
Glucose-Capillary: 52 mg/dL — ABNORMAL LOW (ref 70–99)
Glucose-Capillary: 105 mg/dL — ABNORMAL HIGH (ref 70–99)
Glucose-Capillary: 87 mg/dL (ref 70–99)
Glucose-Capillary: 132 mg/dL — ABNORMAL HIGH (ref 70–99)
Glucose-Capillary: 91 mg/dL (ref 70–99)

## 2022-02-19 LAB — MAGNESIUM: Magnesium: 2.2 mg/dL (ref 1.7–2.4)

## 2022-02-19 MED ORDER — METHOCARBAMOL 500 MG PO TABS
500.0000 mg | ORAL_TABLET | Freq: Three times a day (TID) | ORAL | Status: DC
  Administered 2022-02-19 – 2022-02-25 (×18): 500 mg via ORAL
  Filled 2022-02-19 (×18): qty 1

## 2022-02-19 MED ORDER — POLYETHYLENE GLYCOL 3350 17 G PO PACK
17.0000 g | PACK | Freq: Two times a day (BID) | ORAL | Status: DC | PRN
  Administered 2022-02-19 – 2022-02-21 (×2): 17 g via ORAL
  Filled 2022-02-19 (×2): qty 1

## 2022-02-19 MED ORDER — INSULIN DETEMIR 100 UNIT/ML ~~LOC~~ SOLN
8.0000 [IU] | Freq: Every day | SUBCUTANEOUS | Status: DC
  Administered 2022-02-19: 8 [IU] via SUBCUTANEOUS
  Filled 2022-02-19 (×2): qty 0.08

## 2022-02-19 MED ORDER — SENNOSIDES-DOCUSATE SODIUM 8.6-50 MG PO TABS
1.0000 | ORAL_TABLET | Freq: Two times a day (BID) | ORAL | Status: DC
  Administered 2022-02-19 – 2022-02-25 (×7): 1 via ORAL
  Filled 2022-02-19 (×11): qty 1

## 2022-02-19 MED ORDER — DEXTROSE-NACL 5-0.9 % IV SOLN: INTRAVENOUS | Status: AC

## 2022-02-19 NOTE — Progress Notes (Signed)
Nicholas Weiss  IOE:[id number] DOB: 03-Aug-1950 DOA: 02/17/2022 PCP: Dion Body, MD    Brief Narrative:  71 year old with a history of cocaine abuse, DM 1, MM, and CKD stage IIIa with baseline creatinine 1.6 who was transferred to [facility] from the [facility] ER after he was found to have a traumatic pneumothorax.  The patient reported getting out of bed and stumbling around his house during which he fell at least 3 times, eventually striking the left side of his chest against his stove.  He called EMS for transport due to severe pain.  While in the ER he was also found to be in acute renal failure with a creatinine of 4.0.  Consultants:  Trauma Surgery  Significant Events: 11/22 L chest tube insertion   Goals of Care:  Code Status: Full Code   DVT prophylaxis: SCDs  Interim Hx: Due to gradual increasing in size of his pneumothorax a chest tube was placed by surgery yesterday.  Recurrent episodes of hypoglycemia persist despite ongoing dextrose in his IV fluid.  Otherwise the patient is afebrile with stable vitals.  Denies fevers chills or abdominal pain.  Ongoing chest wall pain associated with his chest tube as expected.  Assessment & Plan:  Acute renal failure on CKD stage IIIa Monitor renal function with volume resuscitation -creatinine steadily improving with volume resuscitation  Recent Labs  Lab 02/18/22 0314 02/18/22 0844 02/18/22 1150 02/18/22 2118 02/19/22 0034  CREATININE 3.67* 3.05* 2.60* 2.36* 2.09*    Uncontrolled DM1 w/ refractory hypoglycemia  though he was acidotic at presentation this was likely lactic acidosis related to his falls as his beta hydroxybutyric acid was <0.05 -continue dextrose IV fluid until CBG stabilized then initiate very low-dose long-acting insulin -suspect his hypoglycemia is due to retained [name] and novolog insulin in setting of acute renal failure - with improving renal function this should correct itself -as he is type I will  need to resume low-dose long-acting insulin as soon as his CBG will safely allow  Hyperkalemia Due to acute renal failure - continue telemetry monitoring for now -corrected with Lokelma and improving renal function  Multiple rib fractures left side with traumatic pneumothorax Care as per trauma surgery with chest tube placed 02/18/2022  Multiple falls Felt to be related to cocaine abuse and poor nutritional state w/ dehydration  L1 superior endplate compression fracture NS reports no intervention currently required - TLSO brace recommended when out of bed - f/u w/ NS as outpt   Cocaine abuse Patient is not interested in abstaining  Multiple myeloma  HTN Presently well controlled  Family Communication: No family present at time of exam Disposition: From home - may require higher level of care initially but not presently clear   Objective: Blood pressure 128/66, pulse 69, temperature 97.7 F (36.5 C), temperature source Oral, resp. rate 10, height _0  (1.778 m), weight 66 kg, SpO2 99 %.  Intake/Output Summary (Last 24 hours) at 02/19/2022 0805 Last data filed at 02/19/2022 1829 Gross per 24 hour  Intake 1846.82 ml  Output 3005 ml  Net -1158.18 ml    Filed Weights   02/17/22 2356 02/18/22 1700  Weight: 68.9 kg 66 kg    Examination: General: No acute respiratory distress Lungs: Clear to auscultation bilaterally without wheezes or crackles Cardiovascular: Regular rate and rhythm without murmur gallop or rub normal S1 and S2 Abdomen: Nontender, nondistended, soft, bowel sounds positive, no rebound, no ascites, no appreciable mass Extremities: No significant cyanosis, clubbing, or edema  bilateral lower extremities   CBC: Recent Labs  Lab 02/17/22 1918 02/18/22 0015 02/18/22 0314 02/19/22 0034  WBC 4.3 3.4* 3.1* 2.4*  NEUTROABS 2.8 2.6 2.1  --   HGB 10.0* 9.5* 9.6* 9.0*  HCT 30.9* 29.4* 29.6* 27.9*  MCV 93.4 96.1 97.4 95.5  PLT 269 234 221 196    Basic  Metabolic Panel: Recent Labs  Lab 02/18/22 0314 02/18/22 0844 02/18/22 1150 02/18/22 2118 02/19/22 0034  NA 139   < > 137 136 134*  K 5.4*   < > 5.6* 4.9 4.8  CL 106   < > 108 109 109  CO2 17*   < > 22 17* 19*  GLUCOSE 70   < > 47* 221* 150*  BUN 79*   < > 65* 54* 51*  CREATININE 3.67*   < > 2.60* 2.36* 2.09*  CALCIUM 8.4*   < > 8.2* 7.8* 7.9*  MG 2.5*  --   --   --  2.2   < > = values in this interval not displayed.    GFR: Estimated Creatinine Clearance: 31.6 mL/min (A) (by C-G formula based on SCr of 2.09 mg/dL (H)).   Scheduled Meds:  acetaminophen  650 mg Oral Q6H   amLODipine  10 mg Oral Daily   docusate sodium  100 mg Oral BID   gabapentin  100 mg Oral TID   insulin aspart  0-5 Units Subcutaneous QHS   insulin aspart  0-9 Units Subcutaneous TID WC   insulin detemir  5 Units Subcutaneous QHS   Continuous Infusions:  sodium chloride 75 mL/hr at 02/18/22 2200   methocarbamol (ROBAXIN) IV       LOS: 1 day   Cherene Altes, MD [facility] Office  [phone] Pager - Text Page per Amion  If 7PM-7AM, please contact night-coverage per Amion 02/19/2022, 8:05 AM

## 2022-02-19 NOTE — Progress Notes (Signed)
Hypoglycemic Event  CBG: 52  Treatment: D50 50 mL (25 gm)  Symptoms: None  Follow-up CBG: Time: 4:59 CBG Result: [place]

## 2022-02-19 NOTE — Evaluation (Signed)
Physical Therapy Evaluation Patient Details Name: Nicholas Weiss MRN: [id number] DOB: 01/10/1951 Today's Date: 02/19/2022  History of Present Illness  Pt is a 71 y.o. M who presents after a fall with traumatic PTX, left rib fxs, L1 compression fx, ARF, hyperglycemia. Significant PMH: DM1, multiple myeloma, CKD stage III.  Clinical Impression  Pt admitted with above. Pt premedicated prior to session and agreeable to participate. Overall is mobilizing fairly well. Ambulating 350 ft with a walker and min guard assist; SpO2 96%, HR 82. Needs continued education on brace management prior to d/c.      Recommendations for follow up therapy are one component of a multi-disciplinary discharge planning process, led by the attending physician.  Recommendations may be updated based on patient status, additional functional criteria and insurance authorization.  Follow Up Recommendations No PT follow up      Assistance Recommended at Discharge PRN  Patient can return home with the following  A little help with walking and/or transfers;A little help with bathing/dressing/bathroom;Assistance with cooking/housework;Help with stairs or ramp for entrance;Assist for transportation    Equipment Recommendations Rolling walker (2 wheels)  Recommendations for Other Services       Functional Status Assessment Patient has had a recent decline in their functional status and demonstrates the ability to make significant improvements in function in a reasonable and predictable amount of time.     Precautions / Restrictions Precautions Precautions: Fall;Other (comment);Back Precaution Comments: chest tube, L rib fxs, back prec for comfort Required Braces or Orthoses: Spinal Brace Spinal Brace: Thoracolumbosacral orthotic;Other (comment) (OOB) Restrictions Weight Bearing Restrictions: No      Mobility  Bed Mobility Overal bed mobility: Modified Independent                  Transfers Overall  transfer level: Needs assistance Equipment used: Rolling walker (2 wheels) Transfers: Sit to/from Stand Sit to Stand: Min guard           General transfer comment: Min guard for safety    Ambulation/Gait Ambulation/Gait assistance: Min guard, +2 safety/equipment Gait Distance (Feet): 350 Feet Assistive device: Rolling walker (2 wheels) Gait Pattern/deviations: Step-through pattern, Decreased stride length, Trunk flexed, Drifts right/left       General Gait Details: Consistent min guard throughout, increased trunk/hip flexion, cues for environmental navigation  Stairs            Wheelchair Mobility    Modified Rankin (Stroke Patients Only)       Balance Overall balance assessment: Needs assistance Sitting-balance support: Feet supported Sitting balance-Leahy Scale: Good     Standing balance support: Bilateral upper extremity supported Standing balance-Leahy Scale: Poor                               Pertinent Vitals/Pain Pain Assessment Pain Assessment: Faces Faces Pain Scale: Hurts even more Pain Location: L ribs Pain Descriptors / Indicators: Discomfort, Grimacing, Guarding, Constant Pain Intervention(s): Monitored during session, Limited activity within patient's tolerance, Premedicated before session    Home Living Family/patient expects to be discharged to:: Private residence Living Arrangements: Spouse/significant other Available Help at Discharge: Family;Available 24 hours/day Type of Home: House Home Access: Stairs to enter Entrance Stairs-Rails: Can reach both Entrance Stairs-Number of Steps: 1 STE bil railing   Home Layout: One level Home Equipment: Cane - single point;Tub bench;Grab bars - toilet;Grab bars - tub/shower      Prior Function Prior Level of Function : Independent/Modified  Independent             Mobility Comments: Independent; 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," with 6 beds in  [place]        Extremity/Trunk Assessment   Upper Extremity Assessment Upper Extremity Assessment: Overall WFL for tasks assessed    Lower Extremity Assessment Lower Extremity Assessment: Overall WFL for tasks assessed    Cervical / Trunk Assessment Cervical / Trunk Assessment:  (rounded shoulders)  Communication   Communication: No difficulties  Cognition Arousal/Alertness: Awake/alert Behavior During Therapy: WFL for tasks assessed/performed Overall Cognitive Status: Within Functional Limits for tasks assessed                                 General Comments: Mildly impulsive        General Comments      Exercises     Assessment/Plan    PT Assessment Patient needs continued PT services  PT Problem List Decreased strength;Decreased activity tolerance;Decreased balance;Decreased mobility;Pain       PT Treatment Interventions DME instruction;Gait training;Stair training;Functional mobility training;Therapeutic activities;Therapeutic exercise;Balance training;Patient/family education    PT Goals (Current goals can be found in the Care Plan section)  Acute Rehab PT Goals Patient Stated Goal: less pain PT Goal Formulation: With patient Time For Goal Achievement: 03/05/22 Potential to Achieve Goals: Good    Frequency Min 3X/week     Co-evaluation               AM-PAC PT "6 Clicks" Mobility  Outcome Measure Help needed turning from your back to your side while in a flat bed without using bedrails?: A Little Help needed moving from lying on your back to sitting on the side of a flat bed without using bedrails?: A Little Help needed moving to and from a bed to a chair (including a wheelchair)?: A Little Help needed standing up from a chair using your arms (e.g., wheelchair or bedside chair)?: A Little Help needed to walk in hospital room?: A Little Help needed climbing 3-5 steps with a railing? : A Little 6 Click  Score: 18    End of Session Equipment Utilized During Treatment: Back brace Activity Tolerance: Patient tolerated treatment well Patient left: in chair;with call bell/phone within reach;with chair alarm set Nurse Communication: Mobility status PT Visit Diagnosis: Unsteadiness on feet (R26.81);History of falling (Z91.81);Pain Pain - Right/Left: Left Pain - part of body:  (flank)    Time: 5056-9794 PT Time Calculation (min) (ACUTE ONLY): 29 min   Charges:   PT Evaluation $PT Eval Moderate Complexity: 1 Mod PT Treatments $Therapeutic Activity: 8-22 mins        Wyona Almas, PT, DPT Acute Rehabilitation Services Office [phone]   Deno Etienne 02/19/2022, 1:37 PM

## 2022-02-19 NOTE — Progress Notes (Signed)
Progress Note     Subjective: Pt complaining of indigestion  Objective: Vital signs in last 24 hours: Temp:  [97.7 F (36.5 C)-98.4 F (36.9 C)] 97.7 F (36.5 C) (11/23 0756) Pulse Rate:  [69-79] 69 (11/23 0756) Resp:  [10-21] 10 (11/23 0756) BP: (120-175)/(46-96) 128/66 (11/23 0756) SpO2:  [99 %-100 %] 99 % (11/23 0756) Weight:  [66 kg] 66 kg (11/22 1700) Last BM Date : 02/12/22  Intake/Output from previous day: 11/22 0701 - 11/23 0700 In: 2475.1 [P.O.:240; I.V.:1235.1; IV Piggyback:1000] Out: 3295 [Urine:2900; Chest Tube:105] Intake/Output this shift: Total I/O In: 117.5 [I.V.:117.5] Out: 300 [Urine:300]  PE: General: pleasant, WD, WN male who is laying in bed in NAD Heart: regular, rate, and rhythm.   Lungs: CTAB, no wheezes, rhonchi, or rales noted.  Respiratory effort nonlabored, L CT without air leak, SS fluid in tubing  Abd: soft, NT, ND, +BS, no masses, hernias, or organomegaly MS: all 4 extremities are symmetrical with no cyanosis, clubbing, or edema.   Lab Results:  Recent Labs    02/18/22 0314 02/19/22 0034  WBC 3.1* 2.4*  HGB 9.6* 9.0*  HCT 29.6* 27.9*  PLT 221 196   BMET Recent Labs    02/18/22 2118 02/19/22 0034  NA 136 134*  K 4.9 4.8  CL 109 109  CO2 17* 19*  GLUCOSE 221* 150*  BUN 54* 51*  CREATININE 2.36* 2.09*  CALCIUM 7.8* 7.9*   PT/INR Recent Labs    02/17/22 2119  LABPROT 13.9  INR 1.1   CMP     Component Value Date/Time   NA 134 (L) 02/19/2022 0034   NA 135 10/03/2020 1317   NA 137 10/11/2012 0542   K 4.8 02/19/2022 0034   K 4.2 10/11/2012 0542   CL 109 02/19/2022 0034   CL 105 10/11/2012 0542   CO2 19 (L) 02/19/2022 0034   CO2 26 10/11/2012 0542   GLUCOSE 150 (H) 02/19/2022 0034   GLUCOSE 239 (H) 10/11/2012 0542   BUN 51 (H) 02/19/2022 0034   BUN 24 10/03/2020 1317   BUN 17 10/11/2012 0542   CREATININE 2.09 (H) 02/19/2022 0034   CREATININE 1.36 (H) 10/11/2012 0542   CALCIUM 7.9 (L) 02/19/2022 0034    CALCIUM 8.7 10/11/2012 0542   PROT 6.0 (L) 02/19/2022 0034   PROT 9.1 (H) 10/03/2020 1317   ALBUMIN 2.8 (L) 02/19/2022 0034   ALBUMIN 3.3 (L) 10/03/2020 1317   AST 37 02/19/2022 0034   ALT 44 02/19/2022 0034   ALKPHOS 51 02/19/2022 0034   BILITOT 0.3 02/19/2022 0034   BILITOT 0.4 10/03/2020 1317   GFRNONAA 34 (L) 02/19/2022 0034   GFRNONAA 57 (L) 10/11/2012 0542   GFRAA 54 (L) 03/15/2020 1056   GFRAA >60 10/11/2012 0542   Lipase     Component Value Date/Time   LIPASE 31 12/06/2021 2347       Studies/Results: DG CHEST PORT 1 VIEW  Result Date: 02/19/2022 CLINICAL DATA:  Trauma.  Rib fracture. EXAM: PORTABLE CHEST 1 VIEW COMPARISON:  02/18/2022 FINDINGS: Stable asymmetric elevation right hemidiaphragm with right base atelectasis. Left pleural drain again noted without definite evidence for left-sided pneumothorax. Subcutaneous emphysema noted left chest wall. Cardiopericardial silhouette is at upper limits of normal for size. Lower left rib fracture evident. IMPRESSION: 1. Left pleural drain without definite evidence for pneumothorax. Left heart border and left hemidiaphragm are well defined raising the question of anteromedial pleural air. 2. Stable asymmetric elevation right hemidiaphragm with right base atelectasis. Electronically  Signed   By: Misty Stanley M.D.   On: 02/19/2022 08:48   DG Chest Portable 1 View  Result Date: 02/18/2022 CLINICAL DATA:  LEFT chest tube placement EXAM: PORTABLE CHEST 1 VIEW COMPARISON:  Portable exam 0944 hours compared to 0130 hours FINDINGS: Pigtail LEFT thoracostomy tube identified with significant decrease in previously seen LEFT pneumothorax. Small loculated pneumothorax at medial lower LEFT lung. Normal heart size, mediastinal contours, and pulmonary vascularity. Subsegmental atelectasis RIGHT base, mildly in retrocardiac LEFT lower lobe. No pleural effusion or infiltrate seen. Atherosclerotic calcification aorta. LEFT chest wall emphysema  extending into cervical region. Displaced fracture of the posterolateral LEFT ninth rib noted. IMPRESSION: Significant decrease in LEFT pneumothorax following chest tube insertion. Bibasilar atelectasis. Displaced fracture posterolateral LEFT ninth rib. Electronically Signed   By: Lavonia Dana M.D.   On: 02/18/2022 09:56   DG CHEST PORT 1 VIEW  Result Date: 02/18/2022 CLINICAL DATA:  Left pneumothorax. EXAM: PORTABLE CHEST 1 VIEW COMPARISON:  02/17/2022. FINDINGS: The heart size and mediastinal contours are stable. There is atherosclerotic calcification of the aorta. There is redemonstration of a left pneumothorax of approximately 23%, slightly increased from the prior exam. Subcutaneous emphysema is noted in the supraclavicular, cervical region, axilla, and lateral chest wall on the left. The right lung is clear. The previously described left rib fractures are not well seen on this exam. IMPRESSION: 1. Slightly increased pneumothorax on the left of approximately 23%. 2. Stable subcutaneous emphysema. Electronically Signed   By: Brett Fairy M.D.   On: 02/18/2022 01:43   DG Chest Portable 1 View  Result Date: 02/17/2022 CLINICAL DATA:  Evaluate pneumothorax.  Fall. EXAM: PORTABLE CHEST 1 VIEW COMPARISON:  Chest x-ray 12/06/2021.  Chest CT 02/17/2022. FINDINGS: Small left pneumothorax is present (proximally 20%). This is stable to minimally increased compared to the prior CT given differences in technique. Left chest wall emphysema is again seen. There is no evidence for mediastinal shift or focal lung infiltrate. The cardiomediastinal silhouette is within normal limits. Osseous structures appear stable. IMPRESSION: 1. Small left pneumothorax, stable to minimally increased compared to the prior CT given differences in technique. 2. Left chest wall emphysema. Electronically Signed   By: Ronney Asters M.D.   On: 02/17/2022 21:35   CT ABDOMEN PELVIS WO CONTRAST  Result Date: 02/17/2022 CLINICAL DATA:   Blunt abdominal trauma. Fall getting out of bed. Additional falls in the kitchen. EXAM: CT ABDOMEN AND PELVIS WITHOUT CONTRAST TECHNIQUE: Multidetector CT imaging of the abdomen and pelvis was performed following the standard protocol without IV contrast. RADIATION DOSE REDUCTION: This exam was performed according to the departmental dose-optimization program which includes automated exposure control, adjustment of the mA and/or kV according to patient size and/or use of iterative reconstruction technique. COMPARISON:  Included portions from chest CT earlier today. Abdominopelvic CT 08/10/2021 FINDINGS: Lower chest: Left basilar pneumothorax, increased from chest CT earlier today. Again seen subcutaneous emphysema in the left chest wall. Left lateral eighth and ninth rib fractures again seen. Hepatobiliary: Lack of contrast limits detailed assessment, allowing for this there is no evidence of injury. Gallbladder physiologically distended, no calcified stone. No biliary dilatation. Pancreas: Unremarkable unenhanced appearance, no evidence of injury. Spleen: Lack of IV contrast limits detailed assessment. Allowing for this, no evidence of splenic injury. No perisplenic free fluid. Adrenals/Urinary Tract: No adrenal hemorrhage or renal injury identified. Bladder is unremarkable. Stomach/Bowel: No obvious bowel injury on this unenhanced exam. There is mild fluid distention of the duodenum. Scattered fluid within  nondilated small bowel. No definite bowel wall thickening or inflammation. Moderate to large colonic stool burden. Normal appendix visualized. Vascular/Lymphatic: Advanced aortic atherosclerosis. There is no perivascular stranding or retroperitoneal fluid to suggest injury. No bulky abdominopelvic adenopathy. Reproductive: Enlarged prostate gland causes mass effect on the bladder base. Other: No free air or free fluid. Musculoskeletal: Unchanged appearance of mild L1 superior endplate compression fracture from  CT earlier today. No acute fracture of the pelvis. IMPRESSION: 1. Left basilar pneumothorax, increased from chest CT earlier today. Left lateral eighth and ninth rib fractures again seen. 2. No evidence of acute traumatic injury to the abdomen or pelvis allowing for limitations related to lack of IV contrast. 3. Unchanged appearance of mild L1 superior endplate compression fracture from CT earlier today. 4. Enlarged prostate gland causes mass effect on the bladder base. Aortic Atherosclerosis (ICD10-I70.0). Electronically Signed   By: Keith Rake M.D.   On: 02/17/2022 20:46   CT Head Wo Contrast  Result Date: 02/17/2022 CLINICAL DATA:  Fall. EXAM: CT HEAD WITHOUT CONTRAST CT MAXILLOFACIAL WITHOUT CONTRAST CT CERVICAL SPINE WITHOUT CONTRAST TECHNIQUE: Multidetector CT imaging of the head, cervical spine, and maxillofacial structures were performed using the standard protocol without intravenous contrast. Multiplanar CT image reconstructions of the cervical spine and maxillofacial structures were also generated. RADIATION DOSE REDUCTION: This exam was performed according to the departmental dose-optimization program which includes automated exposure control, adjustment of the mA and/or kV according to patient size and/or use of iterative reconstruction technique. COMPARISON:  MRI brain dated Aug 10, 2021. CT head dated May 07, 2021. CT cervical spine dated February 27, 2021. FINDINGS: CT HEAD FINDINGS Brain: No evidence of acute infarction, hemorrhage, hydrocephalus, extra-axial collection or mass lesion/mass effect. Stable mild atrophy and chronic microvascular ischemic changes. Vascular: Atherosclerotic vascular calcification of the carotid siphons. No hyperdense vessel. Skull: Normal. Negative for fracture or focal lesion. Other: None. CT MAXILLOFACIAL FINDINGS Osseous: No fracture or mandibular dislocation. No destructive process. Orbits: Negative. No traumatic or inflammatory finding. Sinuses: Tiny  retention cysts in the left maxillary sinus. Remaining paranasal sinuses and mastoid air cells are clear. Soft tissues: Negative. CT CERVICAL SPINE FINDINGS Alignment: No traumatic malalignment. Skull base and vertebrae: No acute fracture. No primary bone lesion or focal pathologic process. Soft tissues and spinal canal: No prevertebral fluid or swelling. No visible canal hematoma. Disc levels: Moderate disc height loss and uncovertebral hypertrophy at C5-C6 and C6-C7. Moderate left facet arthropathy at C3-C4. Upper chest: Please see separate CT chest report from same day. Other: None. IMPRESSION: 1. No acute intracranial abnormality. 2. No acute facial fracture. 3. No acute cervical spine fracture or traumatic malalignment. Electronically Signed   By: Titus Dubin M.D.   On: 02/17/2022 19:19   CT Maxillofacial Wo Contrast  Result Date: 02/17/2022 CLINICAL DATA:  Fall. EXAM: CT HEAD WITHOUT CONTRAST CT MAXILLOFACIAL WITHOUT CONTRAST CT CERVICAL SPINE WITHOUT CONTRAST TECHNIQUE: Multidetector CT imaging of the head, cervical spine, and maxillofacial structures were performed using the standard protocol without intravenous contrast. Multiplanar CT image reconstructions of the cervical spine and maxillofacial structures were also generated. RADIATION DOSE REDUCTION: This exam was performed according to the departmental dose-optimization program which includes automated exposure control, adjustment of the mA and/or kV according to patient size and/or use of iterative reconstruction technique. COMPARISON:  MRI brain dated Aug 10, 2021. CT head dated May 07, 2021. CT cervical spine dated February 27, 2021. FINDINGS: CT HEAD FINDINGS Brain: No evidence of acute infarction, hemorrhage, hydrocephalus, extra-axial  collection or mass lesion/mass effect. Stable mild atrophy and chronic microvascular ischemic changes. Vascular: Atherosclerotic vascular calcification of the carotid siphons. No hyperdense vessel. Skull:  Normal. Negative for fracture or focal lesion. Other: None. CT MAXILLOFACIAL FINDINGS Osseous: No fracture or mandibular dislocation. No destructive process. Orbits: Negative. No traumatic or inflammatory finding. Sinuses: Tiny retention cysts in the left maxillary sinus. Remaining paranasal sinuses and mastoid air cells are clear. Soft tissues: Negative. CT CERVICAL SPINE FINDINGS Alignment: No traumatic malalignment. Skull base and vertebrae: No acute fracture. No primary bone lesion or focal pathologic process. Soft tissues and spinal canal: No prevertebral fluid or swelling. No visible canal hematoma. Disc levels: Moderate disc height loss and uncovertebral hypertrophy at C5-C6 and C6-C7. Moderate left facet arthropathy at C3-C4. Upper chest: Please see separate CT chest report from same day. Other: None. IMPRESSION: 1. No acute intracranial abnormality. 2. No acute facial fracture. 3. No acute cervical spine fracture or traumatic malalignment. Electronically Signed   By: Titus Dubin M.D.   On: 02/17/2022 19:19   CT Cervical Spine Wo Contrast  Result Date: 02/17/2022 CLINICAL DATA:  Fall. EXAM: CT HEAD WITHOUT CONTRAST CT MAXILLOFACIAL WITHOUT CONTRAST CT CERVICAL SPINE WITHOUT CONTRAST TECHNIQUE: Multidetector CT imaging of the head, cervical spine, and maxillofacial structures were performed using the standard protocol without intravenous contrast. Multiplanar CT image reconstructions of the cervical spine and maxillofacial structures were also generated. RADIATION DOSE REDUCTION: This exam was performed according to the departmental dose-optimization program which includes automated exposure control, adjustment of the mA and/or kV according to patient size and/or use of iterative reconstruction technique. COMPARISON:  MRI brain dated Aug 10, 2021. CT head dated May 07, 2021. CT cervical spine dated February 27, 2021. FINDINGS: CT HEAD FINDINGS Brain: No evidence of acute infarction, hemorrhage,  hydrocephalus, extra-axial collection or mass lesion/mass effect. Stable mild atrophy and chronic microvascular ischemic changes. Vascular: Atherosclerotic vascular calcification of the carotid siphons. No hyperdense vessel. Skull: Normal. Negative for fracture or focal lesion. Other: None. CT MAXILLOFACIAL FINDINGS Osseous: No fracture or mandibular dislocation. No destructive process. Orbits: Negative. No traumatic or inflammatory finding. Sinuses: Tiny retention cysts in the left maxillary sinus. Remaining paranasal sinuses and mastoid air cells are clear. Soft tissues: Negative. CT CERVICAL SPINE FINDINGS Alignment: No traumatic malalignment. Skull base and vertebrae: No acute fracture. No primary bone lesion or focal pathologic process. Soft tissues and spinal canal: No prevertebral fluid or swelling. No visible canal hematoma. Disc levels: Moderate disc height loss and uncovertebral hypertrophy at C5-C6 and C6-C7. Moderate left facet arthropathy at C3-C4. Upper chest: Please see separate CT chest report from same day. Other: None. IMPRESSION: 1. No acute intracranial abnormality. 2. No acute facial fracture. 3. No acute cervical spine fracture or traumatic malalignment. Electronically Signed   By: Titus Dubin M.D.   On: 02/17/2022 19:19   CT CHEST WO CONTRAST  Result Date: 02/17/2022 CLINICAL DATA:  Left-sided rib pain after a fall. EXAM: CT CHEST WITHOUT CONTRAST TECHNIQUE: Multidetector CT imaging of the chest was performed following the standard protocol without IV contrast. RADIATION DOSE REDUCTION: This exam was performed according to the departmental dose-optimization program which includes automated exposure control, adjustment of the mA and/or kV according to patient size and/or use of iterative reconstruction technique. COMPARISON:  Chest x-ray dated December 06, 2021. PET-CT dated October 09, 2021. FINDINGS: Cardiovascular: No significant vascular findings. Normal heart size. No pericardial  effusion. Coronary, aortic arch, and branch vessel atherosclerotic vascular disease. Mediastinum/Nodes: No enlarged  mediastinal or axillary lymph nodes. Thyroid gland, trachea, and esophagus demonstrate no significant findings. Lungs/Pleura: Small left pneumothorax. Mild subsegmental atelectasis in the posterior left lower lobe. Unchanged calcified granuloma along the left major fissure. The right lung is clear. No consolidation or pleural effusion. Upper Abdomen: No acute abnormality. Musculoskeletal: Acute nondisplaced fracture of the left lateral eighth rib. Acute minimally displaced fracture of the left posterolateral ninth rib. Subcutaneous emphysema in the left posterolateral chest wall extending into the lower neck. Minimal L1 superior endplate compression fracture, new since July. IMPRESSION: 1. Acute fractures of the left eighth and ninth ribs with small left pneumothorax. 2. Minimal L1 superior endplate compression fracture, new since July. Correlate with point tenderness. 3.  Aortic atherosclerosis (ICD10-I70.0). Electronically Signed   By: Titus Dubin M.D.   On: 02/17/2022 19:08    Anti-infectives: Anti-infectives (From admission, onward)    None        Assessment/Plan  Fall Left 8-9 rib fractures with PTX - CXR without PTX this AM, CT to [facility] and repeat film in AM, possible removal tomorrow if OP low and lung stays up; multimodal pain control, IS L1 compression fx - per NS, TLSO    FEN: Reg diet  VTE: SCDs, LMWH ID: no abx  LOS: 1 day      Norm Parcel, [facility] Surgery 02/19/2022, 9:36 AM Please see Amion for pager number during day hours 7:00am-4:30pm

## 2022-02-19 NOTE — Progress Notes (Signed)
OT Cancellation Note  Patient Details Name: SHERWIN HOLLINGSHED MRN: [id number] DOB: 20-Nov-1950   Cancelled Treatment:    Reason Eval/Treat Not Completed: Patient declined, no reason specified.  Just returned to bed, continue efforts next date.    Keanu Lesniak D Tyana Butzer 02/19/2022, 2:00 PM 02/19/2022  RP, OTR/L  Acute Rehabilitation Services  Office:  [phone]

## 2022-02-20 ENCOUNTER — Other Ambulatory Visit: Payer: Self-pay

## 2022-02-20 ENCOUNTER — Inpatient Hospital Stay (HOSPITAL_COMMUNITY): Payer: Medicare Other

## 2022-02-20 LAB — GLUCOSE, CAPILLARY
Glucose-Capillary: 23 mg/dL — CL (ref 70–99)
Glucose-Capillary: 164 mg/dL — ABNORMAL HIGH (ref 70–99)
Glucose-Capillary: 144 mg/dL — ABNORMAL HIGH (ref 70–99)
Glucose-Capillary: 16 mg/dL — CL (ref 70–99)
Glucose-Capillary: 176 mg/dL — ABNORMAL HIGH (ref 70–99)
Glucose-Capillary: 220 mg/dL — ABNORMAL HIGH (ref 70–99)
Glucose-Capillary: 221 mg/dL — ABNORMAL HIGH (ref 70–99)
Glucose-Capillary: 28 mg/dL — CL (ref 70–99)
Glucose-Capillary: 50 mg/dL — ABNORMAL LOW (ref 70–99)
Glucose-Capillary: 99 mg/dL (ref 70–99)
Glucose-Capillary: 71 mg/dL (ref 70–99)
Glucose-Capillary: 107 mg/dL — ABNORMAL HIGH (ref 70–99)
Glucose-Capillary: 109 mg/dL — ABNORMAL HIGH (ref 70–99)
Glucose-Capillary: 115 mg/dL — ABNORMAL HIGH (ref 70–99)
Glucose-Capillary: 133 mg/dL — ABNORMAL HIGH (ref 70–99)
Glucose-Capillary: 131 mg/dL — ABNORMAL HIGH (ref 70–99)
Glucose-Capillary: 176 mg/dL — ABNORMAL HIGH (ref 70–99)
Glucose-Capillary: 205 mg/dL — ABNORMAL HIGH (ref 70–99)

## 2022-02-20 LAB — CBC
HCT: 29.4 % — ABNORMAL LOW (ref 39.0–52.0)
Hemoglobin: 9.4 g/dL — ABNORMAL LOW (ref 13.0–17.0)
MCH: 30.6 pg (ref 26.0–34.0)
MCHC: 32 g/dL (ref 30.0–36.0)
MCV: 95.8 fL (ref 80.0–100.0)
Platelets: 188 10*3/uL (ref 150–400)
RBC: 3.07 MIL/uL — ABNORMAL LOW (ref 4.22–5.81)
RDW: 16.2 % — ABNORMAL HIGH (ref 11.5–15.5)
WBC: 3.8 10*3/uL — ABNORMAL LOW (ref 4.0–10.5)
nRBC: 0 % (ref 0.0–0.2)

## 2022-02-20 LAB — BASIC METABOLIC PANEL
Anion gap: 7 (ref 5–15)
BUN: 37 mg/dL — ABNORMAL HIGH (ref 8–23)
CO2: 21 mmol/L — ABNORMAL LOW (ref 22–32)
Calcium: 8 mg/dL — ABNORMAL LOW (ref 8.9–10.3)
Chloride: 110 mmol/L (ref 98–111)
Creatinine, Ser: 1.78 mg/dL — ABNORMAL HIGH (ref 0.61–1.24)
GFR, Estimated: 41 mL/min — ABNORMAL LOW (ref >60)
Glucose, Bld: 62 mg/dL — ABNORMAL LOW (ref 70–99)
Potassium: 4.5 mmol/L (ref 3.5–5.1)
Sodium: 138 mmol/L (ref 135–145)

## 2022-02-20 MED ORDER — DEXTROSE 50 % IV SOLN
INTRAVENOUS | Status: AC
  Filled 2022-02-20: qty 50

## 2022-02-20 MED ORDER — INSULIN DETEMIR 100 UNIT/ML ~~LOC~~ SOLN: 4.0000 [IU] | Freq: Every day | SUBCUTANEOUS | Status: DC

## 2022-02-20 MED ORDER — CHLORHEXIDINE GLUCONATE CLOTH 2 % EX PADS
6.0000 | MEDICATED_PAD | Freq: Every day | CUTANEOUS | Status: DC
  Administered 2022-02-20 – 2022-02-22 (×3): 6 via TOPICAL

## 2022-02-20 NOTE — Progress Notes (Signed)
Progress Note     Subjective: Pt denies SOB. Reports some difficulty with mobilization this AM.   Objective: Vital signs in last 24 hours: Temp:  [97.8 F (36.6 C)-98.4 F (36.9 C)] 97.8 F (36.6 C) (11/24 0544) Pulse Rate:  [71-85] 71 (11/24 0544) Resp:  [16-21] 19 (11/24 0544) BP: (107-159)/(64-88) 111/64 (11/24 0544) SpO2:  [94 %-100 %] 99 % (11/24 0544) Weight:  [66.2 kg] 66.2 kg (11/23 2357) Last BM Date : 02/12/22  Intake/Output from previous day: 11/23 0701 - 11/24 0700 In: 1801.8 [P.O.:360; I.V.:1381.8] Out: 2980 [Urine:2820; Chest Tube:160] Intake/Output this shift: No intake/output data recorded.  PE: General: pleasant, WD, WN male who is laying in bed in NAD Heart: regular, rate, and rhythm.   Lungs: CTAB, no wheezes, rhonchi, or rales noted.  Respiratory effort nonlabored, L CT without air leak, SS fluid in tubing  Abd: soft, NT, ND, +BS, no masses, hernias, or organomegaly MS: all 4 extremities are symmetrical with no cyanosis, clubbing, or edema.   Lab Results:  Recent Labs    02/19/22 0034 02/20/22 0039  WBC 2.4* 3.8*  HGB 9.0* 9.4*  HCT 27.9* 29.4*  PLT 196 188    BMET Recent Labs    02/19/22 0034 02/20/22 0039  NA 134* 138  K 4.8 4.5  CL 109 110  CO2 19* 21*  GLUCOSE 150* 62*  BUN 51* 37*  CREATININE 2.09* 1.78*  CALCIUM 7.9* 8.0*    PT/INR Recent Labs    02/17/22 2119  LABPROT 13.9  INR 1.1    CMP     Component Value Date/Time   NA 138 02/20/2022 0039   NA 135 10/03/2020 1317   NA 137 10/11/2012 0542   K 4.5 02/20/2022 0039   K 4.2 10/11/2012 0542   CL 110 02/20/2022 0039   CL 105 10/11/2012 0542   CO2 21 (L) 02/20/2022 0039   CO2 26 10/11/2012 0542   GLUCOSE 62 (L) 02/20/2022 0039   GLUCOSE 239 (H) 10/11/2012 0542   BUN 37 (H) 02/20/2022 0039   BUN 24 10/03/2020 1317   BUN 17 10/11/2012 0542   CREATININE 1.78 (H) 02/20/2022 0039   CREATININE 1.36 (H) 10/11/2012 0542   CALCIUM 8.0 (L) 02/20/2022 0039    CALCIUM 8.7 10/11/2012 0542   PROT 6.0 (L) 02/19/2022 0034   PROT 9.1 (H) 10/03/2020 1317   ALBUMIN 2.8 (L) 02/19/2022 0034   ALBUMIN 3.3 (L) 10/03/2020 1317   AST 37 02/19/2022 0034   ALT 44 02/19/2022 0034   ALKPHOS 51 02/19/2022 0034   BILITOT 0.3 02/19/2022 0034   BILITOT 0.4 10/03/2020 1317   GFRNONAA 41 (L) 02/20/2022 0039   GFRNONAA 57 (L) 10/11/2012 0542   GFRAA 54 (L) 03/15/2020 1056   GFRAA >60 10/11/2012 0542   Lipase     Component Value Date/Time   LIPASE 31 12/06/2021 2347       Studies/Results: DG CHEST PORT 1 VIEW  Result Date: 02/20/2022 CLINICAL DATA:  LEFT pneumothorax, follow-up EXAM: PORTABLE CHEST 1 VIEW COMPARISON:  Portable exam 0651 hours compared to 02/19/2022 FINDINGS: Pigtail LEFT thoracostomy tube unchanged. Upper normal heart size. Mediastinal contours and pulmonary vascularity normal. Atherosclerotic calcification aorta. Bibasilar atelectasis greater on RIGHT. Skin fold projects over LEFT chest. No acute infiltrate, pleural effusion, or pneumothorax. LEFT chest wall emphysema extending into LEFT cervical region. IMPRESSION: Bibasilar atelectasis greater on RIGHT. LEFT thoracostomy tube without pneumothorax. Aortic Atherosclerosis (ICD10-I70.0). Electronically Signed   By: Crist Infante.D.  On: 02/20/2022 08:38   DG CHEST PORT 1 VIEW  Result Date: 02/19/2022 CLINICAL DATA:  Trauma.  Rib fracture. EXAM: PORTABLE CHEST 1 VIEW COMPARISON:  02/18/2022 FINDINGS: Stable asymmetric elevation right hemidiaphragm with right base atelectasis. Left pleural drain again noted without definite evidence for left-sided pneumothorax. Subcutaneous emphysema noted left chest wall. Cardiopericardial silhouette is at upper limits of normal for size. Lower left rib fracture evident. IMPRESSION: 1. Left pleural drain without definite evidence for pneumothorax. Left heart border and left hemidiaphragm are well defined raising the question of anteromedial pleural air. 2. Stable  asymmetric elevation right hemidiaphragm with right base atelectasis. Electronically Signed   By: Misty Stanley M.D.   On: 02/19/2022 08:48    Anti-infectives: Anti-infectives (From admission, onward)    None        Assessment/Plan  Fall Left 8-9 rib fractures with PTX - CXR without PTX this AM, CT on WS, output too high to remove today, keep on WS and repeat CXR in AM - IS, pulm toilet, multimodal pain control  L1 compression fx - per NS, TLSO    FEN: Reg diet  VTE: SCDs, LMWH ID: no abx  LOS: 2 days      Norm Parcel, [facility] Surgery 02/20/2022, 10:40 AM Please see Amion for pager number during day hours 7:00am-4:30pm

## 2022-02-20 NOTE — Progress Notes (Signed)
Nicholas Weiss  ACZ:[id number] DOB: 12-17-1950 DOA: 02/17/2022 PCP: Dion Body, MD    Brief Narrative:  71 year old with a history of cocaine abuse, DM 1, MM, and CKD stage IIIa with baseline creatinine 1.6 who was transferred to [facility] from the [facility] ER after he was found to have a traumatic pneumothorax.  The patient reported getting out of bed and stumbling around his house during which he fell at least 3 times, eventually striking the left side of his chest against his stove.  He called EMS for transport due to severe pain.  While in the ER he was also found to be in acute renal failure with a creatinine of 4.0.  Consultants:  Trauma Surgery  Significant Events: 11/22 L chest tube insertion   Goals of Care:  Code Status: Full Code   DVT prophylaxis: SCDs  Interim Hx: Recurrent episodes of hypoglycemia were encountered early this morning.  The patient is otherwise stable with no fever.  He is having ongoing chest wall pain related to his rib fractures and the presence of the chest tube.  Assessment & Plan:  Acute renal failure on CKD stage IIIa Monitor renal function with volume resuscitation -creatinine steadily improving with volume resuscitation -continue same  Recent Labs  Lab 02/18/22 0844 02/18/22 1150 02/18/22 2118 02/19/22 0034 02/20/22 0039  CREATININE 3.05* 2.60* 2.36* 2.09* 1.78*    Uncontrolled DM1 w/ refractory hypoglycemia  though he was acidotic at presentation this was likely lactic acidosis related to his falls as his beta hydroxybutyric acid was <0.05 -continue dextrose IV fluid until CBG stabilizes then initiate very low-dose long-acting insulin -suspect his hypoglycemia is due to retained [name] and novolog insulin in setting of acute renal failure - with improving renal function this should correct itself -as he is type I will need to resume low-dose long-acting insulin as soon as his CBG will safely allow -continue telemetry monitoring for  now with severe episodes of hypoglycemia  Hyperkalemia Due to acute renal failure - corrected with Lokelma and improving renal function  Multiple rib fractures left side with traumatic pneumothorax Care as per Trauma Surgery with chest tube placed 02/18/2022  Multiple falls Felt to be related to cocaine abuse and poor nutritional state w/ dehydration  L1 superior endplate compression fracture NS reports no intervention currently required - TLSO brace recommended when out of bed - f/u w/ NS as outpt   Cocaine abuse Patient is not interested in abstaining  Multiple myeloma  HTN Presently well controlled  Family Communication: No family present at time of exam Disposition: From home - may require higher level of care initially but not presently clear   Objective: Blood pressure 111/64, pulse 71, temperature 97.8 F (36.6 C), temperature source Oral, resp. rate 19, height _0  (1.778 m), weight 66.2 kg, SpO2 99 %.  Intake/Output Summary (Last 24 hours) at 02/20/2022 0906 Last data filed at 02/20/2022 0553 Gross per 24 hour  Intake 1684.25 ml  Output 2680 ml  Net -995.75 ml    Filed Weights   02/17/22 2356 02/18/22 1700 02/19/22 2357  Weight: 68.9 kg 66 kg 66.2 kg    Examination: General: No acute respiratory distress Lungs: Clear to auscultation bilaterally  Cardiovascular: Regular rate and rhythm without murmur  Abdomen: Nontender, nondistended, soft, bowel sounds positive, no rebound, no ascites, no appreciable mass Extremities: No significant edema bilateral lower extremities   CBC: Recent Labs  Lab 02/17/22 1918 02/18/22 0015 02/18/22 0314 02/19/22 0034 02/20/22 0039  WBC 4.3 3.4* 3.1* 2.4* 3.8*  NEUTROABS 2.8 2.6 2.1  --   --   HGB 10.0* 9.5* 9.6* 9.0* 9.4*  HCT 30.9* 29.4* 29.6* 27.9* 29.4*  MCV 93.4 96.1 97.4 95.5 95.8  PLT 269 234 221 196 754    Basic Metabolic Panel: Recent Labs  Lab 02/18/22 0314 02/18/22 0844 02/18/22 2118  02/19/22 0034 02/20/22 0039  NA 139   < > 136 134* 138  K 5.4*   < > 4.9 4.8 4.5  CL 106   < > 109 109 110  CO2 17*   < > 17* 19* 21*  GLUCOSE 70   < > 221* 150* 62*  BUN 79*   < > 54* 51* 37*  CREATININE 3.67*   < > 2.36* 2.09* 1.78*  CALCIUM 8.4*   < > 7.8* 7.9* 8.0*  MG 2.5*  --   --  2.2  --    < > = values in this interval not displayed.    GFR: Estimated Creatinine Clearance: 37.2 mL/min (A) (by C-G formula based on SCr of 1.78 mg/dL (H)).   Scheduled Meds:  acetaminophen  650 mg Oral Q6H   amLODipine  10 mg Oral Daily   Chlorhexidine Gluconate Cloth  6 each Topical Daily   dextrose       gabapentin  100 mg Oral TID   insulin aspart  0-5 Units Subcutaneous QHS   insulin aspart  0-9 Units Subcutaneous TID WC   insulin detemir  4 Units Subcutaneous QHS   methocarbamol  500 mg Oral TID   senna-docusate  1 tablet Oral BID   Continuous Infusions:  dextrose 5 % and 0.9% NaCl 60 mL/hr at 02/20/22 0424     LOS: 2 days   Cherene Altes, MD [facility] Office  [phone] Pager - Text Page per Shea Evans  If 7PM-7AM, please contact night-coverage per Amion 02/20/2022, 9:06 AM

## 2022-02-20 NOTE — Progress Notes (Signed)
Lab drawn around 0030, glucose resulted 65. RN suspected hypoglycemia, immediately recheck pt CBG and resulted 23. Notify charge RN, initiate hypoglycemia protocol. CBG rechecked after 15 minutes resulted 164.

## 2022-02-20 NOTE — Evaluation (Signed)
Occupational Therapy Evaluation Patient Details Name: Nicholas Weiss MRN: [id number] DOB: 01/11/1951 Today's Date: 02/20/2022   History of Present Illness Pt is a 71 y.o. M who presents after a fall with traumatic PTX, left rib fxs, L1 compression fx, ARF, hyperglycemia. Significant PMH: DM1, multiple myeloma, CKD stage III.   Clinical Impression   Pt is typically independent. He recalls he fell, but not the injuries he incurred and need for TLSO. Asking when he can go home. Pt presents with impaired cognition, pain in L ribs (educated to splint with pillow), generalized weakness and unsteadiness in standing. He needs set up to min assist for ADLs and min assist to stand with hand held assist. Pt with low blood sugar just prior to OT's arrival, likely contributing to functional performance this visit. Will likely progress well.    Recommendations for follow up therapy are one component of a multi-disciplinary discharge planning process, led by the attending physician.  Recommendations may be updated based on patient status, additional functional criteria and insurance authorization.   Follow Up Recommendations  No OT follow up     Assistance Recommended at Discharge Frequent or constant Supervision/Assistance  Patient can return home with the following A little help with walking and/or transfers;A little help with bathing/dressing/bathroom;Assistance with cooking/housework;Assist for transportation;Help with stairs or ramp for entrance    Functional Status Assessment  Patient has had a recent decline in their functional status and demonstrates the ability to make significant improvements in function in a reasonable and predictable amount of time.  Equipment Recommendations  None recommended by OT    Recommendations for Other Services       Precautions / Restrictions Precautions Precautions: Fall Precaution Comments: chest tube, L rib fxs, back prec for comfort Required Braces or  Orthoses: Spinal Brace Spinal Brace: Thoracolumbosacral orthotic;Other (comment) (OOB) Restrictions Weight Bearing Restrictions: No      Mobility Bed Mobility Overal bed mobility: Needs Assistance Bed Mobility: Supine to Sit     Supine to sit: Supervision     General bed mobility comments: HOB up, increased time due to pain, assist for lines, pt without awareness    Transfers Overall transfer level: Needs assistance Equipment used: 1 person hand held assist Transfers: Sit to/from Stand Sit to Stand: Min assist           General transfer comment: steadying assist      Balance Overall balance assessment: Needs assistance   Sitting balance-Leahy Scale: Fair     Standing balance support: Single extremity supported Standing balance-Leahy Scale: Poor                             ADL either performed or assessed with clinical judgement   ADL Overall ADL's : Needs assistance/impaired Eating/Feeding: Set up;Bed level   Grooming: Wash/dry hands;Wash/dry face;Sitting;Supervision/safety   Upper Body Bathing: Minimal assistance;Sitting   Lower Body Bathing: Sit to/from stand;Sitting/lateral leans;Minimal assistance   Upper Body Dressing : Set up;Sitting   Lower Body Dressing: Set up;Sitting/lateral leans Lower Body Dressing Details (indicate cue type and reason): can perform figure 4 to don socks Toilet Transfer: Minimal assistance;Stand-pivot   Toileting- Clothing Manipulation and Hygiene: Set up;Sitting/lateral lean       Functional mobility during ADLs: Minimal assistance (hand held)       Vision Ability to See in Adequate Light: 0 Adequate Patient Visual Report: No change from baseline       Perception  Praxis      Pertinent Vitals/Pain Pain Assessment Pain Assessment: Faces Faces Pain Scale: Hurts even more Pain Location: L ribs Pain Descriptors / Indicators: Discomfort, Grimacing, Guarding, Constant Pain Intervention(s): Other  (comment) (educated in how to splint with a pillow)     Hand Dominance Right   Extremity/Trunk Assessment Upper Extremity Assessment Upper Extremity Assessment: Overall WFL for tasks assessed   Lower Extremity Assessment Lower Extremity Assessment: Defer to PT evaluation   Cervical / Trunk Assessment Cervical / Trunk Assessment: Other exceptions Cervical / Trunk Exceptions: L1 compression fx   Communication Communication Communication: No difficulties   Cognition Arousal/Alertness: Awake/alert Behavior During Therapy: Impulsive Overall Cognitive Status: Impaired/Different from baseline Area of Impairment: Problem solving, Memory, Orientation                 Orientation Level: Situation   Memory: Decreased short-term memory       Problem Solving: Slow processing, Difficulty sequencing, Requires verbal cues General Comments: pain helping to slow pt's pace, pt recalled falling and reported it was due to drugs, did not recall fractures and need for TLSO     General Comments       Exercises     Shoulder Instructions      Home Living Family/patient expects to be discharged to:: Private residence Living Arrangements: Spouse/significant other Available Help at Discharge: Family;Available 24 hours/day Type of Home: House Home Access: Stairs to enter [employer] of Steps: 1 STE bil railing Entrance Stairs-Rails: Can reach both Home Layout: One level     Bathroom Shower/Tub: Teacher, early years/pre: Standard     Home Equipment: Cane - single point;Tub bench;Grab bars - toilet;Grab bars - tub/shower          Prior Functioning/Environment Prior Level of Function : Independent/Modified Independent                        OT Problem List: Decreased activity tolerance;Impaired balance (sitting and/or standing);Decreased cognition;Decreased knowledge of use of DME or AE;Pain      OT Treatment/Interventions: Self-care/ADL  training;Therapeutic activities;Balance training;Patient/family education;DME and/or AE instruction    OT Goals(Current goals can be found in the care plan section) Acute Rehab OT Goals OT Goal Formulation: With patient Time For Goal Achievement: 03/06/22 Potential to Achieve Goals: Good ADL Goals Pt Will Perform Grooming: with supervision;standing Pt Will Transfer to Toilet: with supervision;ambulating;regular height toilet Pt Will Perform Toileting - Clothing Manipulation and hygiene: with supervision;sit to/from stand Additional ADL Goal #1: Pt will complete basic ADLs with supervision.  OT Frequency: Min 2X/week    Co-evaluation              AM-PAC OT "6 Clicks" Daily Activity     Outcome Measure Help from another person eating meals?: None Help from another person taking care of personal grooming?: A Little Help from another person toileting, which includes using toliet, bedpan, or urinal?: A Little Help from another person bathing (including washing, rinsing, drying)?: A Little Help from another person to put on and taking off regular upper body clothing?: A Little Help from another person to put on and taking off regular lower body clothing?: A Little 6 Click Score: 19   End of Session Equipment Utilized During Treatment: Back brace  Activity Tolerance: Patient limited by fatigue;Patient limited by pain Patient left: in bed;with call bell/phone within reach;with bed alarm set  OT Visit Diagnosis: Unsteadiness on feet (R26.81);Other abnormalities of gait and mobility (  R26.89);Pain;Muscle weakness (generalized) (M62.81);History of falling (Z91.81);Other symptoms and signs involving cognitive function                Time: 9728-2060 OT Time Calculation (min): 16 min Charges:  OT General Charges $OT Visit: 1 Visit OT Evaluation $OT Eval Moderate Complexity: [place], OTR/L Acute Rehabilitation Services Office: [phone]  Malka So 02/20/2022,  11:13 AM

## 2022-02-20 NOTE — Progress Notes (Signed)
Physical Therapy Treatment Patient Details Name: Nicholas Weiss MRN: [id number] DOB: 22-Jun-1950 Today's Date: 02/20/2022   History of Present Illness Pt is a 71 y.o. M who presents after a fall with traumatic PTX, left rib fxs, L1 compression fx, ARF, hyperglycemia. Significant PMH: DM1, multiple myeloma, CKD stage III.    PT Comments    The pt was found attempting to exit bed despite BLE being restrained in SCD and bed alarm going off. Pt assisted back into bed initially, but was then eager to mobilize so I stayed for treatment session. The pt required max cues and minA to don back brace, demos no recall of use from prior sessions. He required minA for transfers and despite needing minG initially, progressed to min-modA due to progressively more erratic steps with staggering and scissoring and needing max cues to maintain eyes open. RN asked to take blood sugar mid-ambulation due to sx and it was 50. Pt then returned to bed, needing progressively more assist up to point of minA of 2. Will continue to benefit from skilled PT acutely to progress endurance, stability, and independence with mobility prior to return home.    Recommendations for follow up therapy are one component of a multi-disciplinary discharge planning process, led by the attending physician.  Recommendations may be updated based on patient status, additional functional criteria and insurance authorization.  Follow Up Recommendations  Home health PT     Assistance Recommended at Discharge PRN  Patient can return home with the following A little help with walking and/or transfers;A little help with bathing/dressing/bathroom;Assistance with cooking/housework;Help with stairs or ramp for entrance;Assist for transportation   Equipment Recommendations  Rolling walker (2 wheels)    Recommendations for Other Services       Precautions / Restrictions Precautions Precautions: Fall Precaution Comments: chest tube, L rib fxs,  back prec for comfort Required Braces or Orthoses: Spinal Brace Spinal Brace: Thoracolumbosacral orthotic;Other (comment) (OOB) Restrictions Weight Bearing Restrictions: No     Mobility  Bed Mobility Overal bed mobility: Needs Assistance Bed Mobility: Supine to Sit, Sit to Sidelying, Rolling Rolling: Min assist   Supine to sit: Supervision   Sit to sidelying: Min assist General bed mobility comments: pt almost out of bed on his own, not following spinal precautions. Needed minA to return to supine and max cues for log roll    Transfers Overall transfer level: Needs assistance Equipment used: Rolling walker (2 wheels) Transfers: Sit to/from Stand Sit to Stand: Min assist           General transfer comment: steadying assist    Ambulation/Gait Ambulation/Gait assistance: Min guard, Min assist, +2 safety/equipment Gait Distance (Feet): 60 Feet Assistive device: Rolling walker (2 wheels) Gait Pattern/deviations: Step-through pattern, Decreased stride length, Trunk flexed, Drifts right/left Gait velocity: decreased Gait velocity interpretation: <1.8 ft/sec, indicate of risk for recurrent falls   General Gait Details: initially minG with slowed steps and mild drifting, pt with progressively more staggering and scissoring steps, more erratic movement of RW, and needing minA. Max cues for maintaining eyes open. RN asked to take blood sugar mid-walk and it was 50, pt returned to bed.     Balance Overall balance assessment: Needs assistance   Sitting balance-Leahy Scale: Fair     Standing balance support: Single extremity supported Standing balance-Leahy Scale: Poor Standing balance comment: dependent on minA  Cognition Arousal/Alertness: Awake/alert Behavior During Therapy: Impulsive Overall Cognitive Status: Impaired/Different from baseline Area of Impairment: Problem solving, Memory, Orientation                  Orientation Level: Situation   Memory: Decreased short-term memory       Problem Solving: Slow processing, Difficulty sequencing, Requires verbal cues General Comments: pt impulsively trying to get out of bed with BLE in SCD and pt reaching head-first towards floor fordropped urinal. Pt neeidng max cues for safety, at times getting angry/frustrated with cues. progressiely needing more cues for eyes open and RW management/steering.           General Comments General comments (skin integrity, edema, etc.): BG taken mid-session and was 50, pt returned to bed and given IV sugar by RN      Pertinent Vitals/Pain Pain Assessment Pain Assessment: Faces Faces Pain Scale: Hurts even more Pain Location: L ribs Pain Descriptors / Indicators: Discomfort, Grimacing, Guarding, Constant Pain Intervention(s): Monitored during session, Repositioned, Patient requesting pain meds-RN notified    Home Living Family/patient expects to be discharged to:: Private residence Living Arrangements: Spouse/significant other Available Help at Discharge: Family;Available 24 hours/day Type of Home: House Home Access: Stairs to enter Entrance Stairs-Rails: Can reach both Entrance Stairs-Number of Steps: 1 STE bil railing   Home Layout: One level Home Equipment: Cane - single point;Tub bench;Grab bars - toilet;Grab bars - tub/shower          PT Goals (current goals can now be found in the care plan section) Acute Rehab PT Goals Patient Stated Goal: less pain PT Goal Formulation: With patient Time For Goal Achievement: 03/05/22 Potential to Achieve Goals: Good Progress towards PT goals: Progressing toward goals    Frequency    Min 3X/week      PT Plan Current plan remains appropriate       AM-PAC PT "6 Clicks" Mobility   Outcome Measure  Help needed turning from your back to your side while in a flat bed without using bedrails?: A Little Help needed moving from lying on your back to  sitting on the side of a flat bed without using bedrails?: A Little Help needed moving to and from a bed to a chair (including a wheelchair)?: A Little Help needed standing up from a chair using your arms (e.g., wheelchair or bedside chair)?: A Little Help needed to walk in hospital room?: A Lot Help needed climbing 3-5 steps with a railing? : A Lot 6 Click Score: 16    End of Session Equipment Utilized During Treatment: Back brace Activity Tolerance: Patient tolerated treatment well Patient left: in chair;with call bell/phone within reach;with chair alarm set Nurse Communication: Mobility status PT Visit Diagnosis: Unsteadiness on feet (R26.81);History of falling (Z91.81);Pain Pain - Right/Left: Left     Time: 9024-0973 PT Time Calculation (min) (ACUTE ONLY): 20 min  Charges:  $Gait Training: 8-22 mins                     West Carbo, PT, DPT   Acute Rehabilitation Department   Nicholas Weiss 02/20/2022, 1:25 PM

## 2022-02-20 NOTE — TOC Progression Note (Signed)
Transition of Care [facility]) - Progression Note    Patient Details  Name: STORM SOVINE MRN: [id number] Date of Birth: Jul 17, 1950  [facility] CM/SW Contact  Zenon Mayo, RN Phone Number: 02/20/2022, 10:10 AM  Clinical Narrative:    From home, traumatic PTX after a fall, has chest tube, today blood sugar is 16,also has acute renal failure.  TOC following.         Expected Discharge Plan and Services                                                 Social Determinants of Health (SDOH) Interventions    Readmission Risk Interventions    12/08/2021   10:33 AM 11/27/2021    2:55 PM 11/30/2020    9:44 AM  Readmission Risk Prevention Plan  Transportation Screening Complete Complete Complete  Medication Review Press photographer) Complete Complete Complete  PCP or Specialist appointment within 3-5 days of discharge Complete Complete Complete  HRI or Home Care Consult Complete Complete Complete  SW Recovery Care/Counseling Consult Complete Complete Complete  Palliative Care Screening Not Applicable Not Applicable Not [place] Not Applicable Not Applicable Complete

## 2022-02-21 ENCOUNTER — Inpatient Hospital Stay (HOSPITAL_COMMUNITY): Payer: Medicare Other

## 2022-02-21 LAB — COMPREHENSIVE METABOLIC PANEL
ALT: 37 U/L (ref 0–44)
AST: 27 U/L (ref 15–41)
Albumin: 2.9 g/dL — ABNORMAL LOW (ref 3.5–5.0)
Alkaline Phosphatase: 61 U/L (ref 38–126)
Anion gap: 8 (ref 5–15)
BUN: 27 mg/dL — ABNORMAL HIGH (ref 8–23)
CO2: 23 mmol/L (ref 22–32)
Calcium: 8.5 mg/dL — ABNORMAL LOW (ref 8.9–10.3)
Chloride: 107 mmol/L (ref 98–111)
Creatinine, Ser: 1.69 mg/dL — ABNORMAL HIGH (ref 0.61–1.24)
GFR, Estimated: 44 mL/min — ABNORMAL LOW (ref >60)
Glucose, Bld: 228 mg/dL — ABNORMAL HIGH (ref 70–99)
Potassium: 4.8 mmol/L (ref 3.5–5.1)
Sodium: 138 mmol/L (ref 135–145)
Total Bilirubin: 0.6 mg/dL (ref 0.3–1.2)
Total Protein: 7.1 g/dL (ref 6.5–8.1)

## 2022-02-21 LAB — GLUCOSE, CAPILLARY
Glucose-Capillary: 110 mg/dL — ABNORMAL HIGH (ref 70–99)
Glucose-Capillary: 142 mg/dL — ABNORMAL HIGH (ref 70–99)
Glucose-Capillary: 152 mg/dL — ABNORMAL HIGH (ref 70–99)
Glucose-Capillary: 215 mg/dL — ABNORMAL HIGH (ref 70–99)
Glucose-Capillary: 223 mg/dL — ABNORMAL HIGH (ref 70–99)
Glucose-Capillary: 241 mg/dL — ABNORMAL HIGH (ref 70–99)
Glucose-Capillary: 26 mg/dL — CL (ref 70–99)
Glucose-Capillary: 355 mg/dL — ABNORMAL HIGH (ref 70–99)
Glucose-Capillary: 75 mg/dL (ref 70–99)

## 2022-02-21 LAB — CBC
HCT: 31.9 % — ABNORMAL LOW (ref 39.0–52.0)
Hemoglobin: 10.5 g/dL — ABNORMAL LOW (ref 13.0–17.0)
MCH: 31.3 pg (ref 26.0–34.0)
MCHC: 32.9 g/dL (ref 30.0–36.0)
MCV: 95.2 fL (ref 80.0–100.0)
Platelets: 212 10*3/uL (ref 150–400)
RBC: 3.35 MIL/uL — ABNORMAL LOW (ref 4.22–5.81)
RDW: 15.9 % — ABNORMAL HIGH (ref 11.5–15.5)
WBC: 7.7 10*3/uL (ref 4.0–10.5)
nRBC: 0 % (ref 0.0–0.2)

## 2022-02-21 LAB — MAGNESIUM: Magnesium: 2.1 mg/dL (ref 1.7–2.4)

## 2022-02-21 MED ORDER — INSULIN ASPART 100 UNIT/ML IJ SOLN
0.0000 [IU] | Freq: Three times a day (TID) | INTRAMUSCULAR | Status: DC
  Administered 2022-02-21: 5 [IU] via SUBCUTANEOUS

## 2022-02-21 MED ORDER — INSULIN DETEMIR 100 UNIT/ML ~~LOC~~ SOLN
6.0000 [IU] | Freq: Every day | SUBCUTANEOUS | Status: DC
  Administered 2022-02-21 – 2022-02-22 (×2): 6 [IU] via SUBCUTANEOUS
  Filled 2022-02-21 (×2): qty 0.06

## 2022-02-21 MED ORDER — INSULIN ASPART 100 UNIT/ML IJ SOLN: 0.0000 [IU] | Freq: Three times a day (TID) | INTRAMUSCULAR | Status: DC

## 2022-02-21 MED ORDER — INSULIN ASPART 100 UNIT/ML IJ SOLN: 0.0000 [IU] | Freq: Every day | INTRAMUSCULAR | Status: DC

## 2022-02-21 NOTE — Plan of Care (Signed)
  Problem: Education: Goal: Ability to describe self-care measures that may prevent or decrease complications (Diabetes Survival Skills Education) will improve Outcome: Progressing Goal: Individualized Educational Video(s) Outcome: Progressing   Problem: Cardiac: Goal: Ability to maintain an adequate cardiac output will improve Outcome: Progressing   Problem: Health Behavior/Discharge Planning: Goal: Ability to identify and utilize available resources and services will improve Outcome: Progressing Goal: Ability to manage health-related needs will improve Outcome: Progressing   Problem: Education: Goal: Knowledge of General Education information will improve Description: Including pain rating scale, medication(s)/side effects and non-pharmacologic comfort measures Outcome: Progressing   Problem: Health Behavior/Discharge Planning: Goal: Ability to manage health-related needs will improve Outcome: Progressing   Problem: Clinical Measurements: Goal: Ability to maintain clinical measurements within normal limits will improve Outcome: Progressing Goal: Will remain free from infection Outcome: Progressing Goal: Diagnostic test results will improve Outcome: Progressing

## 2022-02-21 NOTE — Progress Notes (Addendum)
Nicholas Weiss  PPI:[id number] DOB: 1950/11/19 DOA: 02/17/2022 PCP: Dion Body, MD    Brief Narrative:  71 year old with a history of cocaine abuse, DM 1, MM, and CKD stage IIIa with baseline creatinine 1.6 who was transferred to [facility] from the [facility] ER after he was found to have a traumatic pneumothorax.  The patient reported getting out of bed and stumbling around his house during which he fell at least 3 times, eventually striking the left side of his chest against his stove.  He called EMS for transport due to severe pain.  While in the ER he was also found to be in acute renal failure with a creatinine of 4.0.  Consultants:  Trauma Surgery  Significant Events: 11/22 L chest tube insertion   Goals of Care:  Code Status: Full Code   DVT prophylaxis: SCDs  Interim Hx: Chest tube was unable to be pulled yesterday due to ongoing significant fluid draining.  Afebrile.  Vital signs stable.  Saturation 95% room air.  CBG now consistently elevated at 113-241.  Creatinine continues to improve.  No new complaints at time of visit today.  Assessment & Plan:  Acute renal failure on CKD stage IIIa creatinine steadily improving with volume resuscitation -encourage oral intake now and stop volume resuscitation with recheck of creatinine in a.m.  Recent Labs  Lab 02/18/22 1150 02/18/22 2118 02/19/22 0034 02/20/22 0039 02/21/22 0043  CREATININE 2.60* 2.36* 2.09* 1.78* 1.69*    Uncontrolled DM1 w/ refractory hypoglycemia  though he was acidotic at presentation this was likely lactic acidosis related to his falls as his beta hydroxybutyric acid was <0.05 - I continued dextrose IV fluid until CBG stabilized - initiate very low-dose long-acting insulin today as he has type I and monitor CBG closely - suspect his hypoglycemia is due to retained [name] and novolog insulin in setting of acute renal failure  Hyperkalemia Due to acute renal failure - corrected with Lokelma and  improving renal function  Multiple rib fractures left side with traumatic pneumothorax Care as per Trauma Surgery with chest tube placed 02/18/2022  Multiple falls Felt to be related to cocaine abuse and poor nutritional state w/ dehydration  L1 superior endplate compression fracture NS reports no intervention currently required - TLSO brace recommended when out of bed - f/u w/ NS as outpt   Cocaine abuse Patient is not interested in abstaining  Multiple myeloma  HTN Presently well controlled  Family Communication: No family present at time of exam Disposition: From home -plan to return home with [facility] once chest tube able to be removed and renal function stable   Objective: Blood pressure (!) 151/89, pulse 85, temperature 98.9 F (37.2 C), temperature source Oral, resp. rate 10, height _0  (1.778 m), weight 69.5 kg, SpO2 96 %.  Intake/Output Summary (Last 24 hours) at 02/21/2022 0855 Last data filed at 02/21/2022 0700 Gross per 24 hour  Intake 1785.32 ml  Output 725 ml  Net 1060.32 ml    Filed Weights   02/18/22 1700 02/19/22 2357 02/21/22 0400  Weight: 66 kg 66.2 kg 69.5 kg    Examination: General: No acute respiratory distress Lungs: Clear to auscultation bilaterally without wheezing Cardiovascular: Regular rate and rhythm without murmur  Abdomen: Nontender, nondistended, soft, bowel sounds positive, no rebound Extremities: No significant edema bilateral lower extremities   CBC: Recent Labs  Lab 02/17/22 1918 02/18/22 0015 02/18/22 0314 02/19/22 0034 02/20/22 0039 02/21/22 0043  WBC 4.3 3.4* 3.1* 2.4* 3.8* 7.7  NEUTROABS 2.8 2.6 2.1  --   --   --   HGB 10.0* 9.5* 9.6* 9.0* 9.4* 10.5*  HCT 30.9* 29.4* 29.6* 27.9* 29.4* 31.9*  MCV 93.4 96.1 97.4 95.5 95.8 95.2  PLT 269 234 221 196 188 697    Basic Metabolic Panel: Recent Labs  Lab 02/18/22 0314 02/18/22 0844 02/19/22 0034 02/20/22 0039 02/21/22 0043  NA 139   < > 134* 138 138  K 5.4*   < >  4.8 4.5 4.8  CL 106   < > 109 110 107  CO2 17*   < > 19* 21* 23  GLUCOSE 70   < > 150* 62* 228*  BUN 79*   < > 51* 37* 27*  CREATININE 3.67*   < > 2.09* 1.78* 1.69*  CALCIUM 8.4*   < > 7.9* 8.0* 8.5*  MG 2.5*  --  2.2  --  2.1   < > = values in this interval not displayed.    GFR: Estimated Creatinine Clearance: 41.1 mL/min (A) (by C-G formula based on SCr of 1.69 mg/dL (H)).   Scheduled Meds:  acetaminophen  650 mg Oral Q6H   amLODipine  10 mg Oral Daily   Chlorhexidine Gluconate Cloth  6 each Topical Daily   gabapentin  100 mg Oral TID   methocarbamol  500 mg Oral TID   senna-docusate  1 tablet Oral BID   Continuous Infusions:  dextrose 5 % and 0.9% NaCl 50 mL/hr at 02/21/22 0700     LOS: 3 days   Cherene Altes, MD [facility] Office  [phone] Pager - Text Page per Shea Evans  If 7PM-7AM, please contact night-coverage per Amion 02/21/2022, 8:55 AM

## 2022-02-21 NOTE — Progress Notes (Signed)
Progress Note     Subjective: Sleeping comfortably in bed, family at bedside   Objective: Vital signs in last 24 hours: Temp:  [98.5 F (36.9 C)-100 F (37.8 C)] 98.9 F (37.2 C) (11/25 0725) Pulse Rate:  [85-98] 85 (11/25 0725) Resp:  [10-18] 10 (11/25 0725) BP: (124-158)/(75-90) 151/89 (11/25 0725) SpO2:  [92 %-97 %] 96 % (11/25 0725) Weight:  [69.5 kg] 69.5 kg (11/25 0400) Last BM Date : 02/21/22  Intake/Output from previous day: 11/24 0701 - 11/25 0700 In: 1785.3 [I.V.:1785.3] Out: 725 [Urine:725] Intake/Output this shift: Total I/O In: 360 [P.O.:360] Out: -   PE: General:  NAD    Lungs:  Respiratory effort nonlabored, L CT without obvious air leak, SS fluid in tubing    Lab Results:  Recent Labs    02/20/22 0039 02/21/22 0043  WBC 3.8* 7.7  HGB 9.4* 10.5*  HCT 29.4* 31.9*  PLT 188 212    BMET Recent Labs    02/20/22 0039 02/21/22 0043  NA 138 138  K 4.5 4.8  CL 110 107  CO2 21* 23  GLUCOSE 62* 228*  BUN 37* 27*  CREATININE 1.78* 1.69*  CALCIUM 8.0* 8.5*    PT/INR No results for input(s): "LABPROT", "INR" in the last 72 hours.  CMP     Component Value Date/Time   NA 138 02/21/2022 0043   NA 135 10/03/2020 1317   NA 137 10/11/2012 0542   K 4.8 02/21/2022 0043   K 4.2 10/11/2012 0542   CL 107 02/21/2022 0043   CL 105 10/11/2012 0542   CO2 23 02/21/2022 0043   CO2 26 10/11/2012 0542   GLUCOSE 228 (H) 02/21/2022 0043   GLUCOSE 239 (H) 10/11/2012 0542   BUN 27 (H) 02/21/2022 0043   BUN 24 10/03/2020 1317   BUN 17 10/11/2012 0542   CREATININE 1.69 (H) 02/21/2022 0043   CREATININE 1.36 (H) 10/11/2012 0542   CALCIUM 8.5 (L) 02/21/2022 0043   CALCIUM 8.7 10/11/2012 0542   PROT 7.1 02/21/2022 0043   PROT 9.1 (H) 10/03/2020 1317   ALBUMIN 2.9 (L) 02/21/2022 0043   ALBUMIN 3.3 (L) 10/03/2020 1317   AST 27 02/21/2022 0043   ALT 37 02/21/2022 0043   ALKPHOS 61 02/21/2022 0043   BILITOT 0.6 02/21/2022 0043   BILITOT 0.4 10/03/2020  1317   GFRNONAA 44 (L) 02/21/2022 0043   GFRNONAA 57 (L) 10/11/2012 0542   GFRAA 54 (L) 03/15/2020 1056   GFRAA >60 10/11/2012 0542   Lipase     Component Value Date/Time   LIPASE 31 12/06/2021 2347       Studies/Results: DG CHEST PORT 1 VIEW  Result Date: 02/20/2022 CLINICAL DATA:  LEFT pneumothorax, follow-up EXAM: PORTABLE CHEST 1 VIEW COMPARISON:  Portable exam 0651 hours compared to 02/19/2022 FINDINGS: Pigtail LEFT thoracostomy tube unchanged. Upper normal heart size. Mediastinal contours and pulmonary vascularity normal. Atherosclerotic calcification aorta. Bibasilar atelectasis greater on RIGHT. Skin fold projects over LEFT chest. No acute infiltrate, pleural effusion, or pneumothorax. LEFT chest wall emphysema extending into LEFT cervical region. IMPRESSION: Bibasilar atelectasis greater on RIGHT. LEFT thoracostomy tube without pneumothorax. Aortic Atherosclerosis (ICD10-I70.0). Electronically Signed   By: Lavonia Dana M.D.   On: 02/20/2022 08:38    Anti-infectives: Anti-infectives (From admission, onward)    None        Assessment/Plan  Fall Left 8-9 rib fractures with PTX - CXR without PTX this AM, CT on WS, output still too high to remove today, keep on  WS and repeat CXR in AM - IS, pulm toilet, multimodal pain control  L1 compression fx - per NS, TLSO    FEN: Reg diet  VTE: SCDs, LMWH ID: no abx  LOS: 3 days      Rosario Adie, [facility] 02/21/2022, 10:33 AM Please see Amion for pager number during day hours 7:00am-4:30pm

## 2022-02-21 NOTE — Plan of Care (Signed)
  Problem: Education: Goal: Ability to describe self-care measures that may prevent or decrease complications (Diabetes Survival Skills Education) will improve Outcome: Progressing Goal: Individualized Educational Video(s) Outcome: Progressing   Problem: Cardiac: Goal: Ability to maintain an adequate cardiac output will improve Outcome: Progressing   Problem: Health Behavior/Discharge Planning: Goal: Ability to identify and utilize available resources and services will improve Outcome: Progressing Goal: Ability to manage health-related needs will improve Outcome: Progressing

## 2022-02-22 ENCOUNTER — Inpatient Hospital Stay (HOSPITAL_COMMUNITY): Payer: Medicare Other

## 2022-02-22 LAB — BASIC METABOLIC PANEL
Anion gap: 6 (ref 5–15)
BUN: 30 mg/dL — ABNORMAL HIGH (ref 8–23)
CO2: 22 mmol/L (ref 22–32)
Calcium: 8.3 mg/dL — ABNORMAL LOW (ref 8.9–10.3)
Chloride: 110 mmol/L (ref 98–111)
Creatinine, Ser: 1.89 mg/dL — ABNORMAL HIGH (ref 0.61–1.24)
GFR, Estimated: 38 mL/min — ABNORMAL LOW (ref >60)
Glucose, Bld: 97 mg/dL (ref 70–99)
Potassium: 4.8 mmol/L (ref 3.5–5.1)
Sodium: 138 mmol/L (ref 135–145)

## 2022-02-22 LAB — GLUCOSE, CAPILLARY
Glucose-Capillary: 109 mg/dL — ABNORMAL HIGH (ref 70–99)
Glucose-Capillary: 113 mg/dL — ABNORMAL HIGH (ref 70–99)
Glucose-Capillary: 119 mg/dL — ABNORMAL HIGH (ref 70–99)
Glucose-Capillary: 171 mg/dL — ABNORMAL HIGH (ref 70–99)
Glucose-Capillary: 190 mg/dL — ABNORMAL HIGH (ref 70–99)
Glucose-Capillary: 211 mg/dL — ABNORMAL HIGH (ref 70–99)
Glucose-Capillary: 244 mg/dL — ABNORMAL HIGH (ref 70–99)
Glucose-Capillary: 351 mg/dL — ABNORMAL HIGH (ref 70–99)
Glucose-Capillary: 393 mg/dL — ABNORMAL HIGH (ref 70–99)
Glucose-Capillary: 401 mg/dL — ABNORMAL HIGH (ref 70–99)
Glucose-Capillary: 422 mg/dL — ABNORMAL HIGH (ref 70–99)
Glucose-Capillary: 45 mg/dL — ABNORMAL LOW (ref 70–99)
Glucose-Capillary: 55 mg/dL — ABNORMAL LOW (ref 70–99)
Glucose-Capillary: 90 mg/dL (ref 70–99)
Glucose-Capillary: 99 mg/dL (ref 70–99)

## 2022-02-22 MED ORDER — INSULIN DETEMIR 100 UNIT/ML ~~LOC~~ SOLN
10.0000 [IU] | Freq: Every day | SUBCUTANEOUS | Status: DC
  Filled 2022-02-22: qty 0.1

## 2022-02-22 MED ORDER — DEXTROSE-NACL 5-0.9 % IV SOLN: INTRAVENOUS | Status: DC

## 2022-02-22 MED ORDER — INSULIN ASPART 100 UNIT/ML IJ SOLN
0.0000 [IU] | Freq: Three times a day (TID) | INTRAMUSCULAR | Status: DC
  Administered 2022-02-22: 9 [IU] via SUBCUTANEOUS

## 2022-02-22 NOTE — Progress Notes (Incomplete)
Hypoglycemic Event  CBG: 45  Treatment: D50 50 mL (25 gm)  Symptoms: None  Follow-up CBG: Time:2245 CBG Result:190  Possible Reasons for Event: Inadequate meal intake and Medication regimen: ***  Comments/MD notified: how    [place]

## 2022-02-22 NOTE — Progress Notes (Signed)
PROGRESS NOTE Nicholas Weiss  CBS:[id number] DOB: 01-30-1951 DOA: 02/17/2022 PCP: Dion Body, MD   Brief Narrative/Hospital Course: 71 year old with a history of cocaine abuse, DM 1, MM, and CKD stage IIIa with baseline creatinine 1.6 who was transferred to [name] ER after he was found to have a traumatic pneumothorax.  The patient reported getting out of bed and stumbling around his house during which he fell at least 3 times, eventually striking the left side of his chest against his stove.  He called EMS for transport due to severe pain. Seen in ED-found to be in acute renal failure with a creatinine of 4.0, nephrology was consulted and patient was admitted for further management.  Patient had chest tube in place being managed by general surgery, chest tube being removed 11/26  Subjective: Seen and examined Overnight afebrile blood pressure 1 1 7-1 40s, saturating well on RA Labs reviewed blood sugar 109-211 Creat 1.8 creatinine from 1.69<-1.78 on D5 NS at 50 cc/h Levemir 6 units daily Robaxin, PRN IV Dilaudid and oral oxy No hypoglycemia episodes overnight. Cxr this am "Left chest tube remains in place without evidence for pneumothorax.   Assessment and Plan: Principal Problem:   Acute renal failure superimposed on stage 3 chronic kidney disease, unspecified acute renal failure type, unspecified whether stage 3a or 3b CKD (HCC) Active Problems:   Hypoglycemia   Cocaine abuse (HCC)   Syncope and collapse   Traumatic pneumothorax   Multiple fractures of ribs, left side, initial encounter for closed fracture   Type 1 diabetes mellitus with diabetic neuropathy, unspecified (HCC)   Essential (primary) hypertension   Multiple myeloma (HCC)   Pneumothorax, closed, traumatic   AKI on CKD IIIa:creatinine peaked at 4.9 BUN 96 nicely improved with volume resuscitation.  Baseline labs labile ranging from 1.3-1.8.  Overall much better.  Currently off IV fluids  encourage oral intake minimize nephrotoxic medication, monitor Recent Labs    02/17/22 1918 02/18/22 0015 02/18/22 0314 02/18/22 0844 02/18/22 1150 02/18/22 2118 02/19/22 0034 02/20/22 0039 02/21/22 0043 02/22/22 0046  BUN 96* 81* 79* 70* 65* 54* 51* 37* 27* 30*  CREATININE 4.98* 4.10* 3.67* 3.05* 2.60* 2.36* 2.09* 1.78* 1.69* 1.89*   Fall with left 8-9 rib fractures with pneumothorax: Managed with chest tube in place, surgery following appreciate input continue chest x-ray daily in the morning> appears stable this morning.  Chest tube removed this morning, monitor post removal with x-ray.  Cont  IS, pulmonary toileting Multimodal pain control.  Chest x-ray pending this morning  L1 superior endplate compression fracture:NS reports no intervention currently required - TLSO brace recommended when out of bed - f/u w/ NS as outpt   T1DM with uncontrolled hyperglycemia and refractory hypoglycemia : No evidence of DKA given normal pH will be on admission.  Blood sugar fairly stable on D5 normal saline.  Hypoglycemia likely from retained Tresiba 20 u and NovoLog insulin in the setting of AKI.  Cont ssi, Levemir 6 units daily-sugar is now poorly controlled.  Dc dextrose ivf. Recent Labs  Lab 02/22/22 0211 02/22/22 0404 02/22/22 0607 02/22/22 0803 02/22/22 0959  GLUCAP 119* 113* 109* 211* 351*     Hyperkalemia due to AKI.  Resolved with Lokelma.  Monitor Recent Labs  Lab 02/18/22 2118 02/19/22 0034 02/20/22 0039 02/21/22 0043 02/22/22 0046  K 4.9 4.8 4.5 4.8 4.8     Cocaine abuse: He is not interested in abstaining it seems.   Multiple myeloma hx-advised outpatient follow-up  HTN:Bp stable amlodipine 10 mg  DVT prophylaxis: SCDs Start: 02/18/22 0135 Code Status:   Code Status: Full Code Family Communication: plan of care discussed with patient/no family  at bedside.  Patient status is: Patient because of chest tube management Level of care: Progressive  Dispo: The patient  is from: home            Anticipated disposition: Home. Current activity level 5 walks with assist in the room/hall  Objective: Vitals last 24 hrs: Vitals:   02/21/22 2044 02/22/22 0000 02/22/22 0404 02/22/22 0748  BP: 116/75 117/83 135/79 (!) 140/77  Pulse: 77 66  70  Resp: _0 Temp: 98.8 F (37.1 C) 98.2 F (36.8 C) 99.1 F (37.3 C) 97.8 F (36.6 C)  TempSrc: Oral Oral Oral Oral  SpO2: 100% 95% 99% 98%  Weight:   65.6 kg   Height:       Weight change: -3.9 kg  Physical Examination: General exam: alert awake, older than stated age HEENT:Oral mucosa moist, Ear/Nose WNL grossly Respiratory system: bilaterally clear, left chest with dressing in place Cardiovascular system: S1 & S2 +, No JVD. Gastrointestinal system: Abdomen soft,NT,ND, BS+ Nervous System:Alert, awake, moving extremities. Extremities: LE edema neg,distal peripheral pulses palpable.  Skin: No rashes,no icterus. MSK: Normal muscle bulk,tone, power  Medications reviewed:  Scheduled Meds:  acetaminophen  650 mg Oral Q6H   amLODipine  10 mg Oral Daily   Chlorhexidine Gluconate Cloth  6 each Topical Daily   gabapentin  100 mg Oral TID   insulin detemir  6 Units Subcutaneous Daily   methocarbamol  500 mg Oral TID   senna-docusate  1 tablet Oral BID   Continuous Infusions:  dextrose 5 % and 0.9% NaCl 50 mL/hr at 02/22/22 0800    Diet Order             Diet regular Room service appropriate? Yes; Fluid consistency: Thin  Diet effective now                   Intake/Output Summary (Last 24 hours) at 02/22/2022 1017 Last data filed at 02/22/2022 0800 Gross per 24 hour  Intake 869.9 ml  Output 1600 ml  Net -730.1 ml   Net IO Since Admission: -1,417.94 mL [02/22/22 1017]  Wt Readings from Last 3 Encounters:  02/22/22 65.6 kg  02/17/22 68.9 kg  02/13/22 69.1 kg     Unresulted Labs (From admission, onward)    None     Data Reviewed: I have personally reviewed following labs and  imaging studies CBC: Recent Labs  Lab 02/17/22 1918 02/18/22 0015 02/18/22 0314 02/19/22 0034 02/20/22 0039 02/21/22 0043  WBC 4.3 3.4* 3.1* 2.4* 3.8* 7.7  NEUTROABS 2.8 2.6 2.1  --   --   --   HGB 10.0* 9.5* 9.6* 9.0* 9.4* 10.5*  HCT 30.9* 29.4* 29.6* 27.9* 29.4* 31.9*  MCV 93.4 96.1 97.4 95.5 95.8 95.2  PLT 269 234 221 196 188 544   Basic Metabolic Panel: Recent Labs  Lab 02/18/22 0314 02/18/22 0844 02/18/22 2118 02/19/22 0034 02/20/22 0039 02/21/22 0043 02/22/22 0046  NA 139   < > 136 134* 138 138 138  K 5.4*   < > 4.9 4.8 4.5 4.8 4.8  CL 106   < > 109 109 110 107 110  CO2 17*   < > 17* 19* 21* 23 22  GLUCOSE 70   < > 221* 150* 62* 228* 97  BUN 79*   < >  54* 51* 37* 27* 30*  CREATININE 3.67*   < > 2.36* 2.09* 1.78* 1.69* 1.89*  CALCIUM 8.4*   < > 7.8* 7.9* 8.0* 8.5* 8.3*  MG 2.5*  --   --  2.2  --  2.1  --    < > = values in this interval not displayed.   GFR: Estimated Creatinine Clearance: 34.7 mL/min (A) (by C-G formula based on SCr of 1.89 mg/dL (H)). Liver Function Tests: Recent Labs  Lab 02/18/22 0015 02/18/22 0314 02/18/22 2118 02/19/22 0034 02/21/22 0043  AST 40 43* 36 37 27  ALT 55* 56* 42 44 37  ALKPHOS 58 54 50 51 61  BILITOT 0.5 0.7 0.5 0.3 0.6  PROT 6.9 6.7 6.0* 6.0* 7.1  ALBUMIN 3.4* 3.3* 2.7* 2.8* 2.9*   Culture/Microbiology    Component Value Date/Time   SDES BLOOD RIGHT HAND 12/07/2021 0320   SPECREQUEST  12/07/2021 0320    BOTTLES DRAWN AEROBIC AND ANAEROBIC Blood Culture adequate volume   CULT  12/07/2021 0320    NO GROWTH 5 DAYS Performed at [facility], [address] [place], [place] [address]    REPTSTATUS 12/12/2021 FINAL 12/07/2021 0320    Radiology Studies: DG CHEST PORT 1 VIEW  Result Date: 02/22/2022 CLINICAL DATA:  Chest tube EXAM: PORTABLE CHEST 1 VIEW COMPARISON:  02/21/2022 FINDINGS: Left chest tube remains in place without evidence for pneumothorax. Skin fold projects over the left lung apex.  Subsegmental atelectasis noted right base. The cardiopericardial silhouette is within normal limits for size. Subcutaneous emphysema persists in the left chest wall. Telemetry leads overlie the chest. IMPRESSION: Left chest tube remains in place without evidence for pneumothorax. Electronically Signed   By: Misty Stanley M.D.   On: 02/22/2022 09:30   DG CHEST PORT 1 VIEW  Result Date: 02/21/2022 CLINICAL DATA:  Follow-up left-sided pneumothorax. EXAM: PORTABLE CHEST 1 VIEW COMPARISON:  Chest x-ray 02/20/2022 FINDINGS: The left sided chest tube is stable. No pneumothorax. Stable subcutaneous emphysema. Persistent right lower lobe atelectasis. IMPRESSION: Stable left sided chest tube without pneumothorax. Stable subsegmental atelectasis at the right lung base. Electronically Signed   By: Marijo Sanes M.D.   On: 02/21/2022 11:29     LOS: 4 days   Antonieta Pert, MD [facility]  02/22/2022, 10:17 AM

## 2022-02-22 NOTE — Progress Notes (Signed)
Progress Note     Subjective: Having pain from rib fractures. No SHOB. Tolerating diet  Objective: Vital signs in last 24 hours: Temp:  [98.2 F (36.8 C)-99.1 F (37.3 C)] 99.1 F (37.3 C) (11/26 0404) Pulse Rate:  [66-90] 66 (11/26 0000) Resp:  [11-20] 16 (11/26 0404) BP: (91-135)/(61-87) 135/79 (11/26 0404) SpO2:  [95 %-100 %] 99 % (11/26 0404) Weight:  [65.6 kg] 65.6 kg (11/26 0404) Last BM Date : 02/21/22  Intake/Output from previous day: 11/25 0701 - 11/26 0700 In: 542.5 [P.O.:360; I.V.:182.5] Out: 1600 [Urine:1600] Intake/Output this shift: No intake/output data recorded.  PE: General:  NAD    Lungs:  Respiratory effort nonlabored, CTAB, L CT without obvious air leak, minimal SS fluid in tubing and no output charted or accumulated in cannister since 11/24   Lab Results:  Recent Labs    02/20/22 0039 02/21/22 0043  WBC 3.8* 7.7  HGB 9.4* 10.5*  HCT 29.4* 31.9*  PLT 188 212    BMET Recent Labs    02/21/22 0043 02/22/22 0046  NA 138 138  K 4.8 4.8  CL 107 110  CO2 23 22  GLUCOSE 228* 97  BUN 27* 30*  CREATININE 1.69* 1.89*  CALCIUM 8.5* 8.3*    PT/INR No results for input(s): "LABPROT", "INR" in the last 72 hours.  CMP     Component Value Date/Time   NA 138 02/22/2022 0046   NA 135 10/03/2020 1317   NA 137 10/11/2012 0542   K 4.8 02/22/2022 0046   K 4.2 10/11/2012 0542   CL 110 02/22/2022 0046   CL 105 10/11/2012 0542   CO2 22 02/22/2022 0046   CO2 26 10/11/2012 0542   GLUCOSE 97 02/22/2022 0046   GLUCOSE 239 (H) 10/11/2012 0542   BUN 30 (H) 02/22/2022 0046   BUN 24 10/03/2020 1317   BUN 17 10/11/2012 0542   CREATININE 1.89 (H) 02/22/2022 0046   CREATININE 1.36 (H) 10/11/2012 0542   CALCIUM 8.3 (L) 02/22/2022 0046   CALCIUM 8.7 10/11/2012 0542   PROT 7.1 02/21/2022 0043   PROT 9.1 (H) 10/03/2020 1317   ALBUMIN 2.9 (L) 02/21/2022 0043   ALBUMIN 3.3 (L) 10/03/2020 1317   AST 27 02/21/2022 0043   ALT 37 02/21/2022 0043    ALKPHOS 61 02/21/2022 0043   BILITOT 0.6 02/21/2022 0043   BILITOT 0.4 10/03/2020 1317   GFRNONAA 38 (L) 02/22/2022 0046   GFRNONAA 57 (L) 10/11/2012 0542   GFRAA 54 (L) 03/15/2020 1056   GFRAA >60 10/11/2012 0542   Lipase     Component Value Date/Time   LIPASE 31 12/06/2021 2347       Studies/Results: DG CHEST PORT 1 VIEW  Result Date: 02/21/2022 CLINICAL DATA:  Follow-up left-sided pneumothorax. EXAM: PORTABLE CHEST 1 VIEW COMPARISON:  Chest x-ray 02/20/2022 FINDINGS: The left sided chest tube is stable. No pneumothorax. Stable subcutaneous emphysema. Persistent right lower lobe atelectasis. IMPRESSION: Stable left sided chest tube without pneumothorax. Stable subsegmental atelectasis at the right lung base. Electronically Signed   By: Marijo Sanes M.D.   On: 02/21/2022 11:29    Anti-infectives: Anti-infectives (From admission, onward)    None        Assessment/Plan  Fall Left 8-9 rib fractures with PTX - CXR without PTX this AM, CT on WS without output in last 24h. CT removed by me 1000. repeat CXR in 4 hours - IS, pulm toilet, multimodal pain control, IS ordered L1 compression fx - per NS, TLSO  FEN: Reg diet  VTE: SCDs, LMWH ID: no abx  LOS: 4 days      Winferd Humphrey, [facility] 02/22/2022, 7:44 AM Please see Amion for pager number during day hours 7:00am-4:30pm

## 2022-02-22 NOTE — Plan of Care (Signed)
  Problem: Education: Goal: Ability to describe self-care measures that may prevent or decrease complications (Diabetes Survival Skills Education) will improve Outcome: Progressing Goal: Individualized Educational Video(s) Outcome: Progressing   Problem: Cardiac: Goal: Ability to maintain an adequate cardiac output will improve Outcome: Progressing   Problem: Fluid Volume: Goal: Ability to achieve a balanced intake and output will improve Outcome: Progressing   Problem: Metabolic: Goal: Ability to maintain appropriate glucose levels will improve Outcome: Progressing

## 2022-02-23 ENCOUNTER — Other Ambulatory Visit: Payer: Self-pay | Admitting: Oncology

## 2022-02-23 ENCOUNTER — Inpatient Hospital Stay (HOSPITAL_COMMUNITY): Payer: Medicare Other

## 2022-02-23 DIAGNOSIS — C9000 Multiple myeloma not having achieved remission: Secondary | ICD-10-CM

## 2022-02-23 LAB — GLUCOSE, CAPILLARY
Glucose-Capillary: 124 mg/dL — ABNORMAL HIGH (ref 70–99)
Glucose-Capillary: 173 mg/dL — ABNORMAL HIGH (ref 70–99)
Glucose-Capillary: 183 mg/dL — ABNORMAL HIGH (ref 70–99)
Glucose-Capillary: 218 mg/dL — ABNORMAL HIGH (ref 70–99)
Glucose-Capillary: 388 mg/dL — ABNORMAL HIGH (ref 70–99)
Glucose-Capillary: 389 mg/dL — ABNORMAL HIGH (ref 70–99)
Glucose-Capillary: 418 mg/dL — ABNORMAL HIGH (ref 70–99)
Glucose-Capillary: 81 mg/dL (ref 70–99)
Glucose-Capillary: 82 mg/dL (ref 70–99)
Glucose-Capillary: 92 mg/dL (ref 70–99)
Glucose-Capillary: 94 mg/dL (ref 70–99)

## 2022-02-23 LAB — CBC
HCT: 25.6 % — ABNORMAL LOW (ref 39.0–52.0)
Hemoglobin: 8.2 g/dL — ABNORMAL LOW (ref 13.0–17.0)
MCH: 30.6 pg (ref 26.0–34.0)
MCHC: 32 g/dL (ref 30.0–36.0)
MCV: 95.5 fL (ref 80.0–100.0)
Platelets: 226 10*3/uL (ref 150–400)
RBC: 2.68 MIL/uL — ABNORMAL LOW (ref 4.22–5.81)
RDW: 15.9 % — ABNORMAL HIGH (ref 11.5–15.5)
WBC: 5.4 10*3/uL (ref 4.0–10.5)
nRBC: 0 % (ref 0.0–0.2)

## 2022-02-23 LAB — BASIC METABOLIC PANEL
Anion gap: 9 (ref 5–15)
BUN: 35 mg/dL — ABNORMAL HIGH (ref 8–23)
CO2: 22 mmol/L (ref 22–32)
Calcium: 8 mg/dL — ABNORMAL LOW (ref 8.9–10.3)
Chloride: 104 mmol/L (ref 98–111)
Creatinine, Ser: 1.82 mg/dL — ABNORMAL HIGH (ref 0.61–1.24)
GFR, Estimated: 40 mL/min — ABNORMAL LOW (ref >60)
Glucose, Bld: 154 mg/dL — ABNORMAL HIGH (ref 70–99)
Potassium: 4.5 mmol/L (ref 3.5–5.1)
Sodium: 135 mmol/L (ref 135–145)

## 2022-02-23 MED ORDER — INSULIN DETEMIR 100 UNIT/ML ~~LOC~~ SOLN: 5.0000 [IU] | Freq: Every day | SUBCUTANEOUS | Status: DC

## 2022-02-23 MED ORDER — INSULIN DETEMIR 100 UNIT/ML ~~LOC~~ SOLN
3.0000 [IU] | Freq: Every day | SUBCUTANEOUS | Status: DC
  Administered 2022-02-23: 3 [IU] via SUBCUTANEOUS
  Filled 2022-02-23 (×2): qty 0.03

## 2022-02-23 MED ORDER — INSULIN ASPART 100 UNIT/ML IJ SOLN
0.0000 [IU] | Freq: Three times a day (TID) | INTRAMUSCULAR | Status: DC
  Administered 2022-02-23 (×2): 5 [IU] via SUBCUTANEOUS
  Administered 2022-02-24: 3 [IU] via SUBCUTANEOUS
  Administered 2022-02-24: 2 [IU] via SUBCUTANEOUS
  Administered 2022-02-25: 1 [IU] via SUBCUTANEOUS
  Administered 2022-02-25: 3 [IU] via SUBCUTANEOUS

## 2022-02-23 NOTE — Plan of Care (Signed)
  Problem: Education: Goal: Ability to describe self-care measures that may prevent or decrease complications (Diabetes Survival Skills Education) will improve Outcome: Progressing   Problem: Clinical Measurements: Goal: Ability to maintain clinical measurements within normal limits will improve Outcome: Progressing Goal: Will remain free from infection Outcome: Progressing Goal: Diagnostic test results will improve Outcome: Progressing Goal: Respiratory complications will improve Outcome: Progressing   Problem: Nutrition: Goal: Adequate nutrition will be maintained Outcome: Progressing   Problem: Coping: Goal: Level of anxiety will decrease Outcome: Progressing

## 2022-02-23 NOTE — Plan of Care (Signed)
  Problem: Education: Goal: Ability to describe self-care measures that may prevent or decrease complications (Diabetes Survival Skills Education) will improve Outcome: Progressing   Problem: Clinical Measurements: Goal: Ability to maintain clinical measurements within normal limits will improve Outcome: Progressing Goal: Will remain free from infection Outcome: Progressing Goal: Diagnostic test results will improve Outcome: Progressing Goal: Respiratory complications will improve Outcome: Progressing

## 2022-02-23 NOTE — Progress Notes (Signed)
Physical Therapy Treatment Patient Details Name: Nicholas Weiss MRN: [id number] DOB: Sep 22, 1950 Today's Date: 02/23/2022   History of Present Illness Pt is a 71 y.o. M who presents after a fall with traumatic PTX, left rib fxs, L1 compression fx, ARF, hyperglycemia. Significant PMH: DM1, multiple myeloma, CKD stage III.    PT Comments    Pt was seen for mobility on RW with help, but note his HR is immediately very uncontrolled again.  He is tending to be high to move but at 185 made him return to sitting immediately.  Pt is having some pain on L side of ribcage from his fractures, but overall is still fairly comfortable.  Follow along with him to work on PT goals with POC.   Recommendations for follow up therapy are one component of a multi-disciplinary discharge planning process, led by the attending physician.  Recommendations may be updated based on patient status, additional functional criteria and insurance authorization.  Follow Up Recommendations  Home health PT     Assistance Recommended at Discharge PRN  Patient can return home with the following A little help with walking and/or transfers;A little help with bathing/dressing/bathroom;Assistance with cooking/housework;Assist for transportation;Help with stairs or ramp for entrance   Equipment Recommendations  Rolling walker (2 wheels)    Recommendations for Other Services       Precautions / Restrictions Precautions Precautions: Fall Precaution Comments: L rib fx, back prec for comfort,watch HR Required Braces or Orthoses: Spinal Brace Spinal Brace: Applied in sitting position;Thoracolumbosacral orthotic Restrictions Weight Bearing Restrictions: No     Mobility  Bed Mobility Overal bed mobility: Needs Assistance Bed Mobility: Sidelying to Sit, Sit to Sidelying Rolling: Min assist Sidelying to sit: Mod assist     Sit to sidelying: Supervision, Min guard General bed mobility comments: requires repetitive cues for  body mechanics, tends not to follow them    Transfers Overall transfer level: Needs assistance Equipment used: Rolling walker (2 wheels) Transfers: Sit to/from Stand Sit to Stand: Mod assist           General transfer comment: mod assist due to poor effort    Ambulation/Gait Ambulation/Gait assistance: Min guard, Min assist Gait Distance (Feet): 4 Feet Assistive device: Rolling walker (2 wheels) Gait Pattern/deviations: Step-through pattern, Wide base of support Gait velocity: decreased Gait velocity interpretation: <1.31 ft/sec, indicative of household ambulator Pre-gait activities: standing balance and HR ck General Gait Details: pt could not walk far due to his HR elevation to 185 in a fast pace, reduced to 102 wiht sitting rest   Stairs             Wheelchair Mobility    Modified Rankin (Stroke Patients Only)       Balance Overall balance assessment: Needs assistance Sitting-balance support: Feet supported Sitting balance-Leahy Scale: Fair     Standing balance support: Bilateral upper extremity supported, During functional activity Standing balance-Leahy Scale: Poor                              Cognition Arousal/Alertness: Awake/alert Behavior During Therapy: Impulsive Overall Cognitive Status: Impaired/Different from baseline Area of Impairment: Problem solving, Awareness, Safety/judgement, Following commands, Memory, Attention, Orientation                 Orientation Level: Situation Current Attention Level: Selective Memory: Decreased recall of precautions, Decreased short-term memory Following Commands: Follows one step commands with increased time Safety/Judgement: Decreased awareness of safety, Decreased  awareness of deficits Awareness: Intellectual Problem Solving: Slow processing, Difficulty sequencing, Requires verbal cues General Comments: forgetting back precautions to move off bed        Exercises      General  Comments General comments (skin integrity, edema, etc.): elevated HR wiht all standing effort, from 87 at side of bed to 185, down gradually to 102 upon sitting      Pertinent Vitals/Pain Pain Assessment Pain Assessment: Faces Faces Pain Scale: Hurts even more Pain Location: L ribs Pain Descriptors / Indicators: Grimacing, Guarding Pain Intervention(s): Limited activity within patient's tolerance, Monitored during session, Premedicated before session, Repositioned    Home Living                          Prior Function            PT Goals (current goals can now be found in the care plan section) Acute Rehab PT Goals Patient Stated Goal: to get moving    Frequency    Min 3X/week      PT Plan Current plan remains appropriate    Co-evaluation              AM-PAC PT "6 Clicks" Mobility   Outcome Measure  Help needed turning from your back to your side while in a flat bed without using bedrails?: A Little Help needed moving from lying on your back to sitting on the side of a flat bed without using bedrails?: A Little Help needed moving to and from a bed to a chair (including a wheelchair)?: A Little Help needed standing up from a chair using your arms (e.g., wheelchair or bedside chair)?: A Little Help needed to walk in hospital room?: A Little Help needed climbing 3-5 steps with a railing? : A Lot 6 Click Score: 17    End of Session Equipment Utilized During Treatment: Back brace Activity Tolerance: Patient tolerated treatment well Patient left: in bed;with call bell/phone within reach;with bed alarm set Nurse Communication: Mobility status PT Visit Diagnosis: Unsteadiness on feet (R26.81);History of falling (Z91.81);Pain Pain - Right/Left: Left Pain - part of body:  (ribcage)     Time: 6619-6940 PT Time Calculation (min) (ACUTE ONLY): 24 min  Charges:  $Gait Training: 8-22 mins $Therapeutic Activity: 8-22 mins      Ramond Dial 02/23/2022,  2:47 PM  Mee Hives, PT PhD Acute Rehab Dept. Number: [place] and [place]

## 2022-02-23 NOTE — Hospital Course (Signed)
71 year old with a history of cocaine abuse, DM 1, MM, and CKD stage IIIa with baseline creatinine 1.6 who was transferred to [name] ER after he was found to have a traumatic pneumothorax.  The patient reported getting out of bed and stumbling around his house during which he fell at least 3 times, eventually striking the left side of his chest against his stove.  He called EMS for transport due to severe pain. Seen in ED-found to be in acute renal failure with a creatinine of 4.0, nephrology was consulted and patient was admitted for further management.  Patient had chest tube in place being managed by general surgery, chest tube removed 11/26. Patient having issues with hyperand hypoglycemia

## 2022-02-23 NOTE — Progress Notes (Signed)
Progress Note     Subjective: Chest tube removed yesterday. Reports some soreness but overall pain well controlled. Denies SOB.   Objective: Vital signs in last 24 hours: Temp:  [98.7 F (37.1 C)-99.1 F (37.3 C)] 99.1 F (37.3 C) (11/27 0730) Pulse Rate:  [68-85] 68 (11/27 0730) Resp:  [16-20] 16 (11/27 0730) BP: (106-149)/(66-91) 132/91 (11/27 0730) SpO2:  [92 %-99 %] 97 % (11/27 0730) Weight:  [70.1 kg] 70.1 kg (11/27 0406) Last BM Date : 02/21/22  Intake/Output from previous day: 11/26 0701 - 11/27 0700 In: 683.4 [P.O.:420; I.V.:263.4] Out: 2025 [Urine:2025] Intake/Output this shift: No intake/output data recorded.  PE: General:  NAD Lungs:  Respiratory effort nonlabored, CTAB, O2 sat 98% on room air    Lab Results:  Recent Labs    02/21/22 0043 02/23/22 0050  WBC 7.7 5.4  HGB 10.5* 8.2*  HCT 31.9* 25.6*  PLT 212 226    BMET Recent Labs    02/22/22 0046 02/23/22 0050  NA 138 135  K 4.8 4.5  CL 110 104  CO2 22 22  GLUCOSE 97 154*  BUN 30* 35*  CREATININE 1.89* 1.82*  CALCIUM 8.3* 8.0*    PT/INR No results for input(s): "LABPROT", "INR" in the last 72 hours.  CMP     Component Value Date/Time   NA 135 02/23/2022 0050   NA 135 10/03/2020 1317   NA 137 10/11/2012 0542   K 4.5 02/23/2022 0050   K 4.2 10/11/2012 0542   CL 104 02/23/2022 0050   CL 105 10/11/2012 0542   CO2 22 02/23/2022 0050   CO2 26 10/11/2012 0542   GLUCOSE 154 (H) 02/23/2022 0050   GLUCOSE 239 (H) 10/11/2012 0542   BUN 35 (H) 02/23/2022 0050   BUN 24 10/03/2020 1317   BUN 17 10/11/2012 0542   CREATININE 1.82 (H) 02/23/2022 0050   CREATININE 1.36 (H) 10/11/2012 0542   CALCIUM 8.0 (L) 02/23/2022 0050   CALCIUM 8.7 10/11/2012 0542   PROT 7.1 02/21/2022 0043   PROT 9.1 (H) 10/03/2020 1317   ALBUMIN 2.9 (L) 02/21/2022 0043   ALBUMIN 3.3 (L) 10/03/2020 1317   AST 27 02/21/2022 0043   ALT 37 02/21/2022 0043   ALKPHOS 61 02/21/2022 0043   BILITOT 0.6 02/21/2022 0043    BILITOT 0.4 10/03/2020 1317   GFRNONAA 40 (L) 02/23/2022 0050   GFRNONAA 57 (L) 10/11/2012 0542   GFRAA 54 (L) 03/15/2020 1056   GFRAA >60 10/11/2012 0542   Lipase     Component Value Date/Time   LIPASE 31 12/06/2021 2347       Studies/Results: DG CHEST PORT 1 VIEW  Result Date: 02/22/2022 CLINICAL DATA:  Pneumothorax. EXAM: PORTABLE CHEST 1 VIEW COMPARISON:  February 22, 2022. FINDINGS: The heart size and mediastinal contours are within normal limits. Left-sided chest tube has been removed. Minimal left apical pneumothorax is noted. Bibasilar atelectasis is noted. Stable subcutaneous emphysema is seen over left lateral chest wall and supraclavicular region. The visualized skeletal structures are unremarkable. IMPRESSION: Minimal left apical pneumothorax is noted status post left-sided chest tube removal. Stable subcutaneous emphysema is noted. Mild bibasilar atelectasis is noted. Electronically Signed   By: Marijo Conception M.D.   On: 02/22/2022 12:24   DG CHEST PORT 1 VIEW  Result Date: 02/22/2022 CLINICAL DATA:  Chest tube EXAM: PORTABLE CHEST 1 VIEW COMPARISON:  02/21/2022 FINDINGS: Left chest tube remains in place without evidence for pneumothorax. Skin fold projects over the left lung apex. Subsegmental  atelectasis noted right base. The cardiopericardial silhouette is within normal limits for size. Subcutaneous emphysema persists in the left chest wall. Telemetry leads overlie the chest. IMPRESSION: Left chest tube remains in place without evidence for pneumothorax. Electronically Signed   By: Misty Stanley M.D.   On: 02/22/2022 09:30   DG CHEST PORT 1 VIEW  Result Date: 02/21/2022 CLINICAL DATA:  Follow-up left-sided pneumothorax. EXAM: PORTABLE CHEST 1 VIEW COMPARISON:  Chest x-ray 02/20/2022 FINDINGS: The left sided chest tube is stable. No pneumothorax. Stable subcutaneous emphysema. Persistent right lower lobe atelectasis. IMPRESSION: Stable left sided chest tube without  pneumothorax. Stable subsegmental atelectasis at the right lung base. Electronically Signed   By: Marijo Sanes M.D.   On: 02/21/2022 11:29    Anti-infectives: Anti-infectives (From admission, onward)    None        Assessment/Plan  Fall Left 8-9 rib fractures with PTX - L CT removed yesterday, repeat CXR with small apical PTX, repeat CXR this AM but if stable or improved then patient can discharge home from trauma perspective with outpatient CXR in 2 weeks  - IS, pulm toilet, multimodal pain control, IS ordered L1 compression fx - per NS, TLSO    FEN: Reg diet  VTE: SCDs, LMWH ID: no abx  LOS: 5 days      Norm Parcel, [facility] 02/23/2022, 8:20 AM Please see Amion for pager number during day hours 7:00am-4:30pm

## 2022-02-23 NOTE — Care Management Important Message (Signed)
Important Message  Patient Details  Name: Nicholas Weiss MRN: [id number] Date of Birth: March 31, 1950   Medicare Important Message Given:  Yes     Shelda Altes 02/23/2022, 9:57 AM

## 2022-02-23 NOTE — Progress Notes (Signed)
Occupational Therapy Treatment Patient Details Name: Nicholas Weiss MRN: [id number] DOB: [birth year] Today's Date: 02/23/2022   History of present illness Pt is a 71 y.o. M who presents after a fall with traumatic PTX, left rib fxs, L1 compression fx, ARF, hyperglycemia. Significant PMH: DM1, multiple myeloma, CKD stage III.   OT comments  Pt progressing towards goals, with poor recall of precautions and brace wear. Pt needing min -mod A for donning of TLSO brace, min A for LB dressing and simulated toilet transfer. Pt with HR 170-180 with in-room mobility, further mobility deferred and pt returned to bed. Pt asymptomatic, reports pain is better now that he is up and moving. Pt presenting with impairments listed below, will follow acutely. Updating d/c recommendation to [place].   Recommendations for follow up therapy are one component of a multi-disciplinary discharge planning process, led by the attending physician.  Recommendations may be updated based on patient status, additional functional criteria and insurance authorization.    Follow Up Recommendations  Home health OT     Assistance Recommended at Discharge Frequent or constant Supervision/Assistance  Patient can return home with the following  A little help with walking and/or transfers;A little help with bathing/dressing/bathroom;Assistance with cooking/housework;Assist for transportation;Help with stairs or ramp for entrance   Equipment Recommendations  None recommended by OT    Recommendations for Other Services      Precautions / Restrictions Precautions Precautions: Fall Precaution Comments: L rib fx, back prec for comfort,watch HR Required Braces or Orthoses: Spinal Brace Spinal Brace: Applied in sitting position;Thoracolumbosacral orthotic Restrictions Weight Bearing Restrictions: No       Mobility Bed Mobility Overal bed mobility: Needs Assistance Bed Mobility: Sidelying to Sit   Sidelying to sit: Mod  assist     Sit to sidelying: Min assist General bed mobility comments: pt pulling up on therapist to get to EOB, mod cues to follow log roll technique    Transfers Overall transfer level: Needs assistance Equipment used: Rolling walker (2 wheels) Transfers: Sit to/from Stand Sit to Stand: Min assist                 Balance Overall balance assessment: Needs assistance Sitting-balance support: Feet supported Sitting balance-Leahy Scale: Fair     Standing balance support: Single extremity supported Standing balance-Leahy Scale: Poor Standing balance comment: dependent on minA                           ADL either performed or assessed with clinical judgement   ADL Overall ADL's : Needs assistance/impaired                 Upper Body Dressing : Minimal assistance;Moderate assistance;Sitting Upper Body Dressing Details (indicate cue type and reason): donning/doffing TLSO brace Lower Body Dressing: Minimal assistance;Sit to/from stand Lower Body Dressing Details (indicate cue type and reason): can perform figure 4 to don socks Toilet Transfer: Minimal assistance;Ambulation;Regular Glass blower/designer Details (indicate cue type and reason): simulated via functional mobility         Functional mobility during ADLs: Minimal assistance;Rolling walker (2 wheels)      Extremity/Trunk Assessment Upper Extremity Assessment Upper Extremity Assessment: Overall WFL for tasks assessed   Lower Extremity Assessment Lower Extremity Assessment: Defer to PT evaluation        Vision       Perception Perception Perception: Not tested   Praxis Praxis Praxis: Not tested    Cognition Arousal/Alertness: Awake/alert Behavior  During Therapy: Impulsive Overall Cognitive Status: Impaired/Different from baseline Area of Impairment: Problem solving, Memory, Orientation                 Orientation Level: Situation   Memory: Decreased short-term memory        Problem Solving: Slow processing, Difficulty sequencing, Requires verbal cues General Comments: unable to recall need for back precautions and TLSO brace        Exercises      Shoulder Instructions       General Comments HR up to 170-180 with in-room mobility, further mobiltiy deferred and pt returned to bed    Pertinent Vitals/ Pain       Pain Assessment Pain Assessment: Faces Pain Score: 10-Worst pain ever Faces Pain Scale: Hurts worst Pain Location: L ribs Pain Descriptors / Indicators: Discomfort, Grimacing, Guarding, Constant Pain Intervention(s): Limited activity within patient's tolerance, Monitored during session, Repositioned  Home Living                                          Prior Functioning/Environment              Frequency  Min 2X/week        Progress Toward Goals  OT Goals(current goals can now be found in the care plan section)  Progress towards OT goals: Progressing toward goals  Acute Rehab OT Goals OT Goal Formulation: With patient Time For Goal Achievement: 03/06/22 Potential to Achieve Goals: Good ADL Goals Pt Will Perform Grooming: with supervision;standing Pt Will Transfer to Toilet: with supervision;ambulating;regular height toilet Pt Will Perform Toileting - Clothing Manipulation and hygiene: with supervision;sit to/from stand Additional ADL Goal #1: Pt will complete basic ADLs with supervision.  Plan Frequency remains appropriate;Discharge plan needs to be updated    Co-evaluation                 AM-PAC OT "6 Clicks" Daily Activity     Outcome Measure   Help from another person eating meals?: None Help from another person taking care of personal grooming?: A Little Help from another person toileting, which includes using toliet, bedpan, or urinal?: A Little Help from another person bathing (including washing, rinsing, drying)?: A Little Help from another person to put on and taking off  regular upper body clothing?: A Little Help from another person to put on and taking off regular lower body clothing?: A Little 6 Click Score: 19    End of Session Equipment Utilized During Treatment: Rolling walker (2 wheels);Back brace  OT Visit Diagnosis: Unsteadiness on feet (R26.81);Other abnormalities of gait and mobility (R26.89);Pain;Muscle weakness (generalized) (M62.81);History of falling (Z91.81);Other symptoms and signs involving cognitive function   Activity Tolerance Patient limited by fatigue;Patient limited by pain   Patient Left in bed;with call bell/phone within reach;with bed alarm set   Nurse Communication Mobility status;Precautions;Other (comment) (HR)        Time: 4268-3419 OT Time Calculation (min): 19 min  Charges: OT General Charges $OT Visit: 1 Visit OT Treatments $Self Care/Home Management : 8-22 mins  Renaye Rakers, OTD, OTR/L [employer] Preferred Acute Rehab [phone] - [place] 02/23/2022, 12:27 PM

## 2022-02-23 NOTE — Progress Notes (Signed)
TRH night cross cover note:   I was notified by RN of the patient's decreasing blood sugar, which decreased by approximately 150 points over the last 2 hours (244 at 1830 --> 99 at 2030) after receiving 9 units of SS insulin around 1730.  He is asymptomatic with this decrease.   Per my chart review, this is in the context of hypoglycemia earlier in the hospitalization, for which his D5 normal saline was discontinued around 1500 on 02/22/2022, prompted by development of hyperglycemic readings.   Around 2130 I resumed prior d5-NS @ 30 cc/hr. Around 2220, accucheck reflected BS of 45, at which time patient was also asymptomatic.  He received an amp of D50 per hypoglycemia protocol, with improvement in CBG to 190. Will continue the above d5-NS with q2H cbg monitoring for now.      Babs Bertin, DO Hospitalist

## 2022-02-23 NOTE — Progress Notes (Signed)
PROGRESS NOTE IZAYA NETHERTON  OHY:[id number] DOB: 1950/09/08 DOA: 02/17/2022 PCP: Dion Body, MD   Brief Narrative/Hospital Course: 71 year old with a history of cocaine abuse, DM 1, MM, and CKD stage IIIa with baseline creatinine 1.6 who was transferred to [name] the [facility] ER after he was found to have a traumatic pneumothorax.  The patient reported getting out of bed and stumbling around his house during which he fell at least 3 times, eventually striking the left side of his chest against his stove.  He called EMS for transport due to severe pain. Seen in ED-found to be in acute renal failure with a creatinine of 4.0, nephrology was consulted and patient was admitted for further management.  Patient had chest tube in place being managed by general surgery, chest tube removed 11/26. Patient having issues with hyperand hypoglycemia   Subjective: Seen and examined Having BM and getting cleaned Overnight again had hypoglycemia around 10 PM 11/26 as low as 45, prior to that she received 9 units of insulin 530pm for cbg of 401, had Semglee 6 units around 10 AM, she was placed back on dextrose ivf and push, Meal intake 25 to 50%-inconsistent Creat 1.8<1.8< 1.69<-1.78  Assessment and Plan: Principal Problem:   Acute renal failure superimposed on stage 3 chronic kidney disease, unspecified acute renal failure type, unspecified whether stage 3a or 3b CKD (HCC) Active Problems:   Hypoglycemia   Cocaine abuse ([place])   Syncope and collapse   Traumatic pneumothorax   Multiple fractures of ribs, left side, initial encounter for closed fracture   Type 1 diabetes mellitus with diabetic neuropathy, unspecified (HCC)   Essential (primary) hypertension   Multiple myeloma (HCC)   Pneumothorax, closed, traumatic   AKI on CKD IIIa:creatinine peaked at 4.9 BUN 96 nicely improved with volume resuscitation.  Baseline labs labile ranging from 1.3-1.8.  Holding overall stable encourage oral  intake > discontinue D5  Recent Labs    02/18/22 0015 02/18/22 0314 02/18/22 0844 02/18/22 1150 02/18/22 2118 02/19/22 0034 02/20/22 0039 02/21/22 0043 02/22/22 0046 02/23/22 0050  BUN 81* 79* 70* 65* 54* 51* 37* 27* 30* 35*  CREATININE 4.10* 3.67* 3.05* 2.60* 2.36* 2.09* 1.78* 1.69* 1.89* 1.82*   Fall with left 8-9 rib fractures with pneumothorax: Managed with chest tube in place, surgery following> chest tube removed 11/26 follow-up chest x-ray remains stable patient medically cleared from surgical site  L1 superior endplate compression fracture:NS reports no intervention currently required - TLSO brace recommended when out of bed - f/u w/ NS as outpt   T1DM with uncontrolled hyperglycemia and refractory hypoglycemia : No evidence of DKA given normal pH will be on admission. Initially hypoglycemia likely from retained Tresiba 20 u and NovoLog insulin in the setting of AKI. Again had hypoglycemia around 10 PM 11/26 as low as 45, prior to that she received 9 units of insulin 530pm for cbg of 401, had Semglee 6 units around 10 AM, she was placed back on dextrose ivf and push. DM control consulted> once oral intake consistent hopefully blood sugar only stabilized will try to avoid overcorrection , will change 0-9 to sensitive SSI 0-6U SSI- wean off Dextrose ivf. Changed  levemir 6> 3 units and cont to monitor Recent Labs  Lab 02/23/22 0454 02/23/22 0606 02/23/22 0644 02/23/22 0840 02/23/22 1106  GLUCAP 92 82 124* 173* 389*     Hyperkalemia due to AKI.Resolved with Lokelma.  Monitor as below Recent Labs  Lab 02/19/22 0034 02/20/22 0039 02/21/22 0043  02/22/22 0046 02/23/22 0050  K 4.8 4.5 4.8 4.8 4.5  Cocaine abuse: He is not interested in abstaining it seems. Multiple myeloma hx-advised outpatient follow-up HTN:Bp stable cont amlodipine 10 mg  DVT prophylaxis: SCDs Start: 02/18/22 0135 Code Status:   Code Status: Full Code Family Communication: plan of care discussed with  patient/no family  at bedside.  Patient status is: Patient because of chest tube management Level of care: Progressive  Dispo: The patient is from: home            Anticipated disposition: Home once no more hypoglycemia in next 24 hours. Current activity level 5 walks with assist in the room/hall  Objective: Vitals last 24 hrs: Vitals:   02/22/22 2355 02/23/22 0406 02/23/22 0730 02/23/22 1109  BP: 106/66 130/73 (!) 132/91 (!) 155/83  Pulse: 72 70 68 80  Resp: _0 Temp: 98.8 F (37.1 C) 98.7 F (37.1 C) 99.1 F (37.3 C) 98.5 F (36.9 C)  TempSrc: Oral Oral Oral Oral  SpO2: 95% 99% 97% 98%  Weight:  70.1 kg    Height:       Weight change: 4.5 kg  Physical Examination: General exam: AA oriented x3, weak,older appearing HEENT:Oral mucosa moist, Ear/Nose WNL grossly, dentition normal. Respiratory system: bilaterally clear BS, no use of accessory muscle Cardiovascular system: S1 & S2 +, regular rate, JVD neg. Gastrointestinal system: Abdomen soft,NT,ND,BS+ Nervous System:Alert, awake, moving extremities and grossly nonfocal Extremities: LE ankle edema , lower extremities warm Skin:No rashes,no icterus. ZDG:LOVFIE muscle bulk,tone, power   Medications reviewed:  Scheduled Meds:  acetaminophen  650 mg Oral Q6H   amLODipine  10 mg Oral Daily   gabapentin  100 mg Oral TID   insulin aspart  0-6 Units Subcutaneous TID WC   insulin detemir  3 Units Subcutaneous Daily   methocarbamol  500 mg Oral TID   senna-docusate  1 tablet Oral BID   Continuous Infusions:    Diet Order             Diet regular Room service appropriate? Yes; Fluid consistency: Thin  Diet effective now                   Intake/Output Summary (Last 24 hours) at 02/23/2022 1138 Last data filed at 02/23/2022 1053 Gross per 24 hour  Intake 873.33 ml  Output 1625 ml  Net -751.67 ml   Net IO Since Admission: -2,569.61 mL [02/23/22 1138]  Wt Readings from Last 3 Encounters:  02/23/22 70.1  kg  02/17/22 68.9 kg  02/13/22 69.1 kg     Unresulted Labs (From admission, onward)    None     Data Reviewed: I have personally reviewed following labs and imaging studies CBC: Recent Labs  Lab 02/17/22 1918 02/18/22 0015 02/18/22 0314 02/19/22 0034 02/20/22 0039 02/21/22 0043 02/23/22 0050  WBC 4.3 3.4* 3.1* 2.4* 3.8* 7.7 5.4  NEUTROABS 2.8 2.6 2.1  --   --   --   --   HGB 10.0* 9.5* 9.6* 9.0* 9.4* 10.5* 8.2*  HCT 30.9* 29.4* 29.6* 27.9* 29.4* 31.9* 25.6*  MCV 93.4 96.1 97.4 95.5 95.8 95.2 95.5  PLT 269 234 221 196 188 212 332   Basic Metabolic Panel: Recent Labs  Lab 02/18/22 0314 02/18/22 0844 02/19/22 0034 02/20/22 0039 02/21/22 0043 02/22/22 0046 02/23/22 0050  NA 139   < > 134* 138 138 138 135  K 5.4*   < > 4.8 4.5 4.8 4.8 4.5  CL  106   < > 109 110 107 110 104  CO2 17*   < > 19* 21* _0 GLUCOSE 70   < > 150* 62* 228* 97 154*  BUN 79*   < > 51* 37* 27* 30* 35*  CREATININE 3.67*   < > 2.09* 1.78* 1.69* 1.89* 1.82*  CALCIUM 8.4*   < > 7.9* 8.0* 8.5* 8.3* 8.0*  MG 2.5*  --  2.2  --  2.1  --   --    < > = values in this interval not displayed.   GFR: Estimated Creatinine Clearance: 38.5 mL/min (A) (by C-G formula based on SCr of 1.82 mg/dL (H)). Liver Function Tests: Recent Labs  Lab 02/18/22 0015 02/18/22 0314 02/18/22 2118 02/19/22 0034 02/21/22 0043  AST 40 43* 36 37 27  ALT 55* 56* 42 44 37  ALKPHOS 58 54 50 51 61  BILITOT 0.5 0.7 0.5 0.3 0.6  PROT 6.9 6.7 6.0* 6.0* 7.1  ALBUMIN 3.4* 3.3* 2.7* 2.8* 2.9*   Culture/Microbiology    Component Value Date/Time   SDES BLOOD RIGHT HAND 12/07/2021 0320   SPECREQUEST  12/07/2021 0320    BOTTLES DRAWN AEROBIC AND ANAEROBIC Blood Culture adequate volume   CULT  12/07/2021 0320    NO GROWTH 5 DAYS Performed at [facility], [address] [place], [place] [address]    REPTSTATUS 12/12/2021 FINAL 12/07/2021 0320    Radiology Studies: DG CHEST PORT 1 VIEW  Result Date:  02/23/2022 CLINICAL DATA:  Follow-up left pneumothorax. EXAM: PORTABLE CHEST 1 VIEW COMPARISON:  02/22/2022.  CT, 02/17/2022. FINDINGS: No visualized pneumothorax. Left anterolateral and neck base subcutaneous emphysema is stable. Mildly displaced fracture of the left lateral ninth rib, also unchanged. Bilateral lung base opacities consistent with atelectasis, stable on the left, improved on the right the previous exam. Remainder of the lungs is clear. IMPRESSION: 1. No visualized residual left pneumothorax. 2. Improved right lung base atelectasis. No other change from the previous exam. Electronically Signed   By: Lajean Manes M.D.   On: 02/23/2022 10:10   DG CHEST PORT 1 VIEW  Result Date: 02/22/2022 CLINICAL DATA:  Pneumothorax. EXAM: PORTABLE CHEST 1 VIEW COMPARISON:  February 22, 2022. FINDINGS: The heart size and mediastinal contours are within normal limits. Left-sided chest tube has been removed. Minimal left apical pneumothorax is noted. Bibasilar atelectasis is noted. Stable subcutaneous emphysema is seen over left lateral chest wall and supraclavicular region. The visualized skeletal structures are unremarkable. IMPRESSION: Minimal left apical pneumothorax is noted status post left-sided chest tube removal. Stable subcutaneous emphysema is noted. Mild bibasilar atelectasis is noted. Electronically Signed   By: Marijo Conception M.D.   On: 02/22/2022 12:24   DG CHEST PORT 1 VIEW  Result Date: 02/22/2022 CLINICAL DATA:  Chest tube EXAM: PORTABLE CHEST 1 VIEW COMPARISON:  02/21/2022 FINDINGS: Left chest tube remains in place without evidence for pneumothorax. Skin fold projects over the left lung apex. Subsegmental atelectasis noted right base. The cardiopericardial silhouette is within normal limits for size. Subcutaneous emphysema persists in the left chest wall. Telemetry leads overlie the chest. IMPRESSION: Left chest tube remains in place without evidence for pneumothorax. Electronically  Signed   By: Misty Stanley M.D.   On: 02/22/2022 09:30     LOS: 5 days   Antonieta Pert, MD [facility]  02/23/2022, 11:38 AM

## 2022-02-24 ENCOUNTER — Inpatient Hospital Stay (HOSPITAL_COMMUNITY): Payer: Medicare Other

## 2022-02-24 LAB — GLUCOSE, CAPILLARY
Glucose-Capillary: 236 mg/dL — ABNORMAL HIGH (ref 70–99)
Glucose-Capillary: 300 mg/dL — ABNORMAL HIGH (ref 70–99)
Glucose-Capillary: 277 mg/dL — ABNORMAL HIGH (ref 70–99)
Glucose-Capillary: 232 mg/dL — ABNORMAL HIGH (ref 70–99)
Glucose-Capillary: 281 mg/dL — ABNORMAL HIGH (ref 70–99)
Glucose-Capillary: 183 mg/dL — ABNORMAL HIGH (ref 70–99)
Glucose-Capillary: 113 mg/dL — ABNORMAL HIGH (ref 70–99)
Glucose-Capillary: 82 mg/dL (ref 70–99)

## 2022-02-24 MED ORDER — INSULIN DETEMIR 100 UNIT/ML ~~LOC~~ SOLN
4.0000 [IU] | Freq: Every day | SUBCUTANEOUS | Status: DC
  Administered 2022-02-24 – 2022-02-25 (×2): 4 [IU] via SUBCUTANEOUS
  Filled 2022-02-24 (×3): qty 0.04

## 2022-02-24 MED ORDER — AMLODIPINE BESYLATE 5 MG PO TABS
5.0000 mg | ORAL_TABLET | Freq: Every day | ORAL | Status: DC
  Filled 2022-02-24: qty 1

## 2022-02-24 MED ORDER — INSULIN ASPART 100 UNIT/ML IJ SOLN: 2.0000 [IU] | Freq: Three times a day (TID) | INTRAMUSCULAR | Status: DC

## 2022-02-24 MED ORDER — INSULIN ASPART 100 UNIT/ML IJ SOLN
2.0000 [IU] | Freq: Three times a day (TID) | INTRAMUSCULAR | Status: DC
  Administered 2022-02-24 – 2022-02-25 (×2): 2 [IU] via SUBCUTANEOUS

## 2022-02-24 MED ORDER — SODIUM CHLORIDE 0.9 % IV BOLUS
1000.0000 mL | Freq: Once | INTRAVENOUS | Status: AC
  Administered 2022-02-24: 1000 mL via INTRAVENOUS

## 2022-02-24 NOTE — Progress Notes (Signed)
Physical Therapy Treatment Patient Details Name: Nicholas Weiss MRN: [id number] DOB: 1950-10-14 Today's Date: 02/24/2022   History of Present Illness Pt is a 71 y.o. M who presents after a fall with traumatic PTX, left rib fxs, L1 compression fx, ARF, hyperglycemia. Significant PMH: DM1, multiple myeloma, CKD stage III.    PT Comments    Pt with good progress this session, HR stable throughout and pt able to progress OOB mobility. Pt min guard throughout gait for safety with assist needed throughout for cues for safety and RW management. Pt continues to need cues throughout session for back precautions and to adhere to them. Current plan remains appropriate to address deficits and maximize functional independence and decrease caregiver burden. Pt continues to benefit from skilled PT services to progress toward functional mobility goals.    Recommendations for follow up therapy are one component of a multi-disciplinary discharge planning process, led by the attending physician.  Recommendations may be updated based on patient status, additional functional criteria and insurance authorization.  Follow Up Recommendations  Home health PT     Assistance Recommended at Discharge PRN  Patient can return home with the following A little help with walking and/or transfers;A little help with bathing/dressing/bathroom;Assistance with cooking/housework;Assist for transportation;Help with stairs or ramp for entrance   Equipment Recommendations  Rolling walker (2 wheels)    Recommendations for Other Services       Precautions / Restrictions Precautions Precautions: Fall Precaution Comments: L rib fx, back prec for comfort,watch HR Required Braces or Orthoses: Spinal Brace Spinal Brace: Applied in sitting position;Thoracolumbosacral orthotic Restrictions Weight Bearing Restrictions: No     Mobility  Bed Mobility Overal bed mobility: Needs Assistance Bed Mobility: Sidelying to Sit, Sit to  Sidelying Rolling: Min guard Sidelying to sit: Mod assist     Sit to sidelying: Supervision, Min guard General bed mobility comments: requires repetitive cues for body mechanics, tends not to follow them    Transfers Overall transfer level: Needs assistance Equipment used: Rolling walker (2 wheels) Transfers: Sit to/from Stand Sit to Stand: Min guard           General transfer comment: min guard for safety, pt wanting to complete on own    Ambulation/Gait Ambulation/Gait assistance: Min guard, Min assist Gait Distance (Feet): 96 Feet Assistive device: Rolling walker (2 wheels) Gait Pattern/deviations: Step-through pattern, Wide base of support Gait velocity: decreased     General Gait Details: pt with noted genu-varus, needing cues to keep RW straight as pt weaving it R/L, cues for posture and RW proximity, HR stable throughout, up to 97bpm with activity   Stairs             Wheelchair Mobility    Modified Rankin (Stroke Patients Only)       Balance Overall balance assessment: Needs assistance Sitting-balance support: Feet supported Sitting balance-Leahy Scale: Fair     Standing balance support: Bilateral upper extremity supported, During functional activity Standing balance-Leahy Scale: Poor                              Cognition Arousal/Alertness: Awake/alert Behavior During Therapy: WFL for tasks assessed/performed Overall Cognitive Status: Impaired/Different from baseline Area of Impairment: Problem solving, Awareness, Safety/judgement, Following commands, Memory, Attention, Orientation                 Orientation Level: Situation Current Attention Level: Selective Memory: Decreased recall of precautions, Decreased short-term memory Following Commands:  Follows one step commands with increased time Safety/Judgement: Decreased awareness of safety, Decreased awareness of deficits Awareness: Intellectual Problem Solving: Slow  processing, Difficulty sequencing, Requires verbal cues General Comments: forgetting back precautions to move off bed        Exercises      General Comments General comments (skin integrity, edema, etc.): HR stable throughout session, 85-97bpm      Pertinent Vitals/Pain Pain Assessment Pain Assessment: Faces Faces Pain Scale: Hurts little more Pain Location: L ribs Pain Descriptors / Indicators: Grimacing, Guarding Pain Intervention(s): Monitored during session, Limited activity within patient's tolerance, Repositioned    Home Living                          Prior Function            PT Goals (current goals can now be found in the care plan section) Acute Rehab PT Goals Patient Stated Goal: to get moving PT Goal Formulation: With patient Time For Goal Achievement: 03/05/22 Progress towards PT goals: Progressing toward goals    Frequency    Min 3X/week      PT Plan Current plan remains appropriate    Co-evaluation              AM-PAC PT "6 Clicks" Mobility   Outcome Measure  Help needed turning from your back to your side while in a flat bed without using bedrails?: A Little Help needed moving from lying on your back to sitting on the side of a flat bed without using bedrails?: A Little Help needed moving to and from a bed to a chair (including a wheelchair)?: A Little Help needed standing up from a chair using your arms (e.g., wheelchair or bedside chair)?: A Little Help needed to walk in hospital room?: A Little Help needed climbing 3-5 steps with a railing? : A Lot 6 Click Score: 17    End of Session Equipment Utilized During Treatment: Back brace Activity Tolerance: Patient tolerated treatment well Patient left: in bed;with call bell/phone within reach;with bed alarm set Nurse Communication: Mobility status PT Visit Diagnosis: Unsteadiness on feet (R26.81);History of falling (Z91.81);Pain Pain - Right/Left: Left Pain - part of body:   (ribcage)     Time: 8867-7373 PT Time Calculation (min) (ACUTE ONLY): 24 min  Charges:  $Gait Training: 8-22 mins $Therapeutic Activity: 8-22 mins                     Jahlil Ziller R. PTA Acute Rehabilitation Services Office: [place] 02/24/2022, 4:33 PM

## 2022-02-24 NOTE — Inpatient Diabetes Management (Signed)
Inpatient Diabetes Program Recommendations  AACE/ADA: New Consensus Statement on Inpatient Glycemic Control (2015)  Target Ranges:  Prepandial:   less than 140 mg/dL      Peak postprandial:   less than 180 mg/dL (1-2 hours)      Critically ill patients:  140 - 180 mg/dL   Lab Results  Component Value Date   GLUCAP 277 (H) 02/24/2022   HGBA1C 8.9 (H) 11/27/2021    Review of Glycemic Control  Latest Reference Range & Units 02/23/22 23:32 02/24/22 02:29 02/24/22 06:29 02/24/22 07:47  Glucose-Capillary 70 - 99 mg/dL 81 236 (H) 300 (H) 277 (H)  (H): Data is abnormally high Diabetes history: Type 1 DM Outpatient Diabetes medications: Farxiga 10 mg QD, Tresiba 20 units QD, Novolog 0-14 units TID Current orders for Inpatient glycemic control: Novolog 0-6 units TID, Levemir 4 units QD  Inpatient Diabetes Program Recommendations:    Noted consult and multiple episodes of hypoglycemia and sensitivity to insulin.   At this time, could consider: -Adding Novolog 2 units with breakfast and lunch only (Assuming patient is consuming >50% of meals).   In this patient, it is important to give insulin within one hour of previous CBG to avoid further hypoglycemia.   Thanks, Bronson Curb, MSN, RNC-OB Diabetes Coordinator [phone] (8a-5p)

## 2022-02-24 NOTE — Progress Notes (Signed)
PROGRESS NOTE Nicholas Weiss  VOZ:[id number] DOB: 07/21/1950 DOA: 02/17/2022 PCP: Dion Body, MD   Brief Narrative/Hospital Course: 71 year old with a history of cocaine abuse, DM 1, MM, and CKD stage IIIa with baseline creatinine 1.6 who was [name] ER after he was found to have a traumatic pneumothorax.  The patient reported getting out of bed and stumbling around his house during which he fell at least 3 times, eventually striking the left side of his chest against his stove.  He called EMS for transport due to severe pain. Seen in ED-found to be in acute renal failure with a creatinine of 4.0, nephrology was consulted and patient was admitted for further management.  Patient had chest tube in place being managed by general surgery, chest tube removed 11/26. Patient having issues with hyper and hypoglycemia> appears to have brittle diabetes No more hypoglycemia since 11/26 10 PM when blood sugar was around 45.   Subjective: Seen and examined this morning. Patient heart rate increased up to 190 transiently while trying to sit up on the edge of the bed also complaining of left chest pain at the site of rib fracture No more drop in blood sugar overnight CBG past 24 hrs 81-300. On RA, off chest tube since 11/26 When asked how much insulin he takes he got upset: He says he takes NovoLog sliding scale and also [name] he could not tell me how much [name] he takes at home  Assessment and Plan: Principal Problem:   Acute renal failure superimposed on stage 3 chronic kidney disease, unspecified acute renal failure type, unspecified whether stage 3a or 3b CKD ([place]) Active Problems:   Hypoglycemia   Cocaine abuse ([place])   Syncope and collapse   Traumatic pneumothorax   Multiple fractures of ribs, left side, initial encounter for closed fracture   Type 1 diabetes mellitus with diabetic neuropathy, unspecified ([place])   Essential (primary) hypertension    Multiple myeloma ([place])   Pneumothorax, closed, traumatic   AKI on CKD IIIa:creatinine peaked at 4.9 BUN 96 nicely improved with volume resuscitation.  Baseline labs labile ranging from 1.3-1.8.  Stable at baseline.  Encourage oral hydration.  Patient tachycardic and also somewhat dizzy on trying to sit up, BP had been soft in 90s>  ordered normal saline 1 L x 1 Recent Labs    02/18/22 0015 02/18/22 0314 02/18/22 0844 02/18/22 1150 02/18/22 2118 02/19/22 0034 02/20/22 0039 02/21/22 0043 02/22/22 0046 02/23/22 0050  BUN 81* 79* 70* 65* 54* 51* 37* 27* 30* 35*  CREATININE 4.10* 3.67* 3.05* 2.60* 2.36* 2.09* 1.78* 1.69* 1.89* 1.82*   Fall with left 8-9 rib fractures with pneumothorax: Managed with chest tube> removed 11/26 follow-up chest x-ray remains stable, on RA.  Again rib fracture noted on chest x-ray and stable, continue pain control. L1 superior endplate compression fracture:NS reports no intervention currently required - TLSO brace recommended when out of bed - f/u w/ NS as outpt   T1DM with uncontrolled hyperglycemia and refractory hypoglycemia,Brittle DM : On admission no evidence of DKA.  Patient had episodes of hypoglycemia likely in the setting of renal failure/[place] - takes 20 u at home.  CBG 10 PM 11/26 as low as 45, prior to that received 9 units of insulin 530 pm for cbg of 401, had Semglee 6 units around 10 AM> was on dextrose ivf and push> no longer hypoglycemia, off d5 ivf, Now on 0-6u sensitive SSI, semglee increased 3>4 u.  Added 2  u Premeal NovoLog, to be given within 1 hour of CBG check>nursing informed>  monitor cbg qachs. DM coordinator consulted. Recent Labs  Lab 02/24/22 0229 02/24/22 0629 02/24/22 0747 02/24/22 1105 02/24/22 1247  GLUCAP 236* 300* 277* 232* 281*     Hyperkalemia due to AKI.Resolved.monitor. Cocaine abuse: He is not interested in abstaining it seems. Multiple myeloma hx-advised outpatient follow-up HTN:Bp stable> this morning 95/78> will  hold off on amlodipine  DVT prophylaxis: SCDs Start: 02/18/22 0135 Code Status:   Code Status: Full Code Family Communication: plan of care discussed with patient/no family  at bedside.  Patient status is: Patient because of chest tube management Level of care: Progressive  Dispo: The patient is from: home            Anticipated disposition: Home once no more hypoglycemia in next 24 hours  Objective: Vitals last 24 hrs: Vitals:   02/24/22 0749 02/24/22 0751 02/24/22 1003 02/24/22 1108  BP: 91/73 95/78 130/71 120/69  Pulse: (!) 102 (!) 102  83  Resp: _0 Temp: 99.9 F (37.7 C)  98.7 F (37.1 C) 99.3 F (37.4 C)  TempSrc: Oral  Oral Oral  SpO2: 92% 93%  95%  Weight:      Height:       Weight change: -2.4 kg  Physical Examination: General exam: AAOX3, weak,older appearing HEENT:Oral mucosa moist, Ear/Nose WNL grossly, dentition normal. Respiratory system: bilaterally CLEAR BS, no use of accessory muscle Cardiovascular system: S1 & S2 +, regular rate, JVD NEG. Gastrointestinal system: Abdomen soft, NT,ND,BS+ Nervous System:Alert, awake, moving extremities and grossly nonfocal Extremities: LE ankle edema NEG, lower extremities warm Skin: No rashes,no icterus. MSK: Normal muscle bulk,tone, power   Medications reviewed:  Scheduled Meds:  acetaminophen  650 mg Oral Q6H   gabapentin  100 mg Oral TID   insulin aspart  0-6 Units Subcutaneous TID WC   insulin aspart  2 Units Subcutaneous TID WC   insulin detemir  4 Units Subcutaneous Daily   methocarbamol  500 mg Oral TID   senna-docusate  1 tablet Oral BID   Continuous Infusions:    Diet Order             Diet regular Room service appropriate? Yes; Fluid consistency: Thin  Diet effective now                   Intake/Output Summary (Last 24 hours) at 02/24/2022 1301 Last data filed at 02/24/2022 1254 Gross per 24 hour  Intake 952 ml  Output 2950 ml  Net -1998 ml   Net IO Since Admission: -4,567.61  mL [02/24/22 1301]  Wt Readings from Last 3 Encounters:  02/23/22 67.7 kg  02/17/22 68.9 kg  02/13/22 69.1 kg     Unresulted Labs (From admission, onward)    None     Data Reviewed: I have personally reviewed following labs and imaging studies CBC: Recent Labs  Lab 02/17/22 1918 02/18/22 0015 02/18/22 0314 02/19/22 0034 02/20/22 0039 02/21/22 0043 02/23/22 0050  WBC 4.3 3.4* 3.1* 2.4* 3.8* 7.7 5.4  NEUTROABS 2.8 2.6 2.1  --   --   --   --   HGB 10.0* 9.5* 9.6* 9.0* 9.4* 10.5* 8.2*  HCT 30.9* 29.4* 29.6* 27.9* 29.4* 31.9* 25.6*  MCV 93.4 96.1 97.4 95.5 95.8 95.2 95.5  PLT 269 234 221 196 188 212 022   Basic Metabolic Panel: Recent Labs  Lab 02/18/22 0314 02/18/22 0844 02/19/22 0034 02/20/22 0039 02/21/22 0043  02/22/22 0046 02/23/22 0050  NA 139   < > 134* 138 138 138 135  K 5.4*   < > 4.8 4.5 4.8 4.8 4.5  CL 106   < > 109 110 107 110 104  CO2 17*   < > 19* 21* _0 GLUCOSE 70   < > 150* 62* 228* 97 154*  BUN 79*   < > 51* 37* 27* 30* 35*  CREATININE 3.67*   < > 2.09* 1.78* 1.69* 1.89* 1.82*  CALCIUM 8.4*   < > 7.9* 8.0* 8.5* 8.3* 8.0*  MG 2.5*  --  2.2  --  2.1  --   --    < > = values in this interval not displayed.   GFR: Estimated Creatinine Clearance: 37.2 mL/min (A) (by C-G formula based on SCr of 1.82 mg/dL (H)). Liver Function Tests: Recent Labs  Lab 02/18/22 0015 02/18/22 0314 02/18/22 2118 02/19/22 0034 02/21/22 0043  AST 40 43* 36 37 27  ALT 55* 56* 42 44 37  ALKPHOS 58 54 50 51 61  BILITOT 0.5 0.7 0.5 0.3 0.6  PROT 6.9 6.7 6.0* 6.0* 7.1  ALBUMIN 3.4* 3.3* 2.7* 2.8* 2.9*   Culture/Microbiology    Component Value Date/Time   SDES BLOOD RIGHT HAND 12/07/2021 0320   SPECREQUEST  12/07/2021 0320    BOTTLES DRAWN AEROBIC AND ANAEROBIC Blood Culture adequate volume   CULT  12/07/2021 0320    NO GROWTH 5 DAYS Performed at [facility], [address] [place], [place] [address]    REPTSTATUS 12/12/2021 FINAL 12/07/2021  0320    Radiology Studies: [facility] Chest Port 1 View  Result Date: 02/24/2022 CLINICAL DATA:  Chest pain history of fall EXAM: PORTABLE CHEST 1 VIEW COMPARISON:  Chest radiograph 1 day prior FINDINGS: The heart is enlarged, unchanged. The upper mediastinal contours are stable. Aeration of the lung bases is improved compared to the study from 1 day prior. There is no new or worsening focal airspace disease. There is a probable trace left pleural effusion. There is no significant right effusion. There is no pneumothorax The IMPRESSION: 1. Improved aeration of the lung bases since the study from 1 day prior with a probable trace residual left pleural effusion. No appreciable pneumothorax. 2. Unchanged left ninth rib fracture and associated subcutaneous emphysema. Electronically Signed   By: Valetta Mole M.D.   On: 02/24/2022 10:55   DG CHEST PORT 1 VIEW  Result Date: 02/23/2022 CLINICAL DATA:  Follow-up left pneumothorax. EXAM: PORTABLE CHEST 1 VIEW COMPARISON:  02/22/2022.  CT, 02/17/2022. FINDINGS: No visualized pneumothorax. Left anterolateral and neck base subcutaneous emphysema is stable. Mildly displaced fracture of the left lateral ninth rib, also unchanged. Bilateral lung base opacities consistent with atelectasis, stable on the left, improved on the right the previous exam. Remainder of the lungs is clear. IMPRESSION: 1. No visualized residual left pneumothorax. 2. Improved right lung base atelectasis. No other change from the previous exam. Electronically Signed   By: Lajean Manes M.D.   On: 02/23/2022 10:10     LOS: 6 days   Antonieta Pert, MD [facility]  02/24/2022, 1:01 PM

## 2022-02-25 LAB — CBC
HCT: 33.2 % — ABNORMAL LOW (ref 39.0–52.0)
Hemoglobin: 10.8 g/dL — ABNORMAL LOW (ref 13.0–17.0)
MCH: 30.8 pg (ref 26.0–34.0)
MCHC: 32.5 g/dL (ref 30.0–36.0)
MCV: 94.6 fL (ref 80.0–100.0)
Platelets: 297 10*3/uL (ref 150–400)
RBC: 3.51 MIL/uL — ABNORMAL LOW (ref 4.22–5.81)
RDW: 15.3 % (ref 11.5–15.5)
WBC: 2.9 10*3/uL — ABNORMAL LOW (ref 4.0–10.5)
nRBC: 0 % (ref 0.0–0.2)

## 2022-02-25 LAB — GLUCOSE, CAPILLARY
Glucose-Capillary: 143 mg/dL — ABNORMAL HIGH (ref 70–99)
Glucose-Capillary: 172 mg/dL — ABNORMAL HIGH (ref 70–99)
Glucose-Capillary: 206 mg/dL — ABNORMAL HIGH (ref 70–99)
Glucose-Capillary: 231 mg/dL — ABNORMAL HIGH (ref 70–99)
Glucose-Capillary: 251 mg/dL — ABNORMAL HIGH (ref 70–99)
Glucose-Capillary: 271 mg/dL — ABNORMAL HIGH (ref 70–99)

## 2022-02-25 LAB — BASIC METABOLIC PANEL
Anion gap: 11 (ref 5–15)
BUN: 28 mg/dL — ABNORMAL HIGH (ref 8–23)
CO2: 21 mmol/L — ABNORMAL LOW (ref 22–32)
Calcium: 8.6 mg/dL — ABNORMAL LOW (ref 8.9–10.3)
Chloride: 100 mmol/L (ref 98–111)
Creatinine, Ser: 1.63 mg/dL — ABNORMAL HIGH (ref 0.61–1.24)
GFR, Estimated: 46 mL/min — ABNORMAL LOW (ref >60)
Glucose, Bld: 287 mg/dL — ABNORMAL HIGH (ref 70–99)
Potassium: 4.5 mmol/L (ref 3.5–5.1)
Sodium: 132 mmol/L — ABNORMAL LOW (ref 135–145)

## 2022-02-25 MED ORDER — CARVEDILOL 3.125 MG PO TABS
3.1250 mg | ORAL_TABLET | Freq: Two times a day (BID) | ORAL | Status: DC
  Administered 2022-02-25: 3.125 mg via ORAL
  Filled 2022-02-25: qty 1

## 2022-02-25 MED ORDER — TRESIBA FLEXTOUCH 100 UNIT/ML ~~LOC~~ SOPN: 4.0000 [IU] | PEN_INJECTOR | Freq: Every morning | SUBCUTANEOUS | Status: DC

## 2022-02-25 NOTE — Progress Notes (Signed)
Mobility Specialist Progress Note   02/25/22 1039  Mobility  Activity Ambulated with assistance in hallway;Ambulated with assistance to bathroom  Level of Assistance Contact guard assist, steadying assist  [place] wheel walker  Distance Ambulated (ft) 160 ft  Range of Motion/Exercises Active;All extremities  Activity Response Tolerated well   Pre Ambulation:  HR 99 During Ambulation: HR 106 Post Ambulation: HR 100  Patient received standing at sink with RN and NT present. Agreeable to participate in mobility. Ambulated min guard with slow gait. Required cues for proper mechanics and to stay proximal to RW. HR maintained low 100's throughout ambulation. Returned to room without complaint or incident. Was left in bathroom for BM with all needs met, call bell in reach.   Nicholas Weiss, BS EXP Mobility Specialist Please contact via SecureChat or Rehab office at [phone]

## 2022-02-25 NOTE — TOC Initial Note (Signed)
Transition of Care [facility]) - Initial/Assessment Note    Patient Details  Name: Nicholas Weiss MRN: [id number] Date of Birth: November 25, 1950  Transition of Care [facility] CM/SW Contact:    Zenon Mayo, RN Phone Number: 02/25/2022, 2:46 PM  Clinical Narrative:                 Patient is for dc today, NCM offered choice with Medicare. Gov list, he states he does not have a preference.  NCM made referral to [facility] with [place] , she is able to take referral.  Soc will begin 24 to 48 hrs post dc.  Patient states his significant other, Ivin Booty will be transporting him home today.  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    Expected Discharge Plan: [place] Barriers to Discharge: No Barriers Identified   Patient Goals and CMS Choice Patient states their goals for this hospitalization and ongoing recovery are:: return home CMS Medicare.gov Compare Post Acute Care list provided to:: Patient Choice offered to / list presented to : Patient  Expected Discharge Plan and Services Expected Discharge Plan: [place] In-house Referral: NA Discharge Planning Services: CM Consult Post Acute Care Choice: [place] arrangements for the past 2 months: Single Family Home Expected Discharge Date: 02/25/22               DME Arranged: N/A DME Agency: NA       HH Arranged: PT, OT HH Agency: [place] Date HH Agency Contacted: 02/25/22 Time [place]: 1445 Representative spoke with at [place]: Claiborne Billings  Prior Living Arrangements/Services Living arrangements for the past 2 months: [place] with:: Significant Other   Do you feel safe going back to the place where you live?: Yes      Need for Family Participation in Patient Care: Yes (Comment) Care giver support system in place?: Yes (comment) Current home services: DME (has a walker,) Criminal Activity/Legal Involvement Pertinent to Current  Situation/Hospitalization: No - Comment as needed  Activities of Daily Living      Permission Sought/Granted                  Emotional Assessment Appearance:: Appears stated age Attitude/Demeanor/Rapport: Engaged Affect (typically observed): Appropriate Orientation: : Oriented to Self, Oriented to Place, Oriented to  Time, Oriented to Situation Alcohol / Substance Use: Not Applicable Psych Involvement: No (comment)  Admission diagnosis:  Hypoglycemia [E16.2] Pneumothorax, closed, traumatic [S27.0XXA] Traumatic pneumothorax, initial encounter [S27.0XXA] Traumatic pneumothorax without open wound into thorax, initial encounter [S27.0XXA] Closed fracture of multiple ribs of left side, initial encounter [S22.42XA] Acute renal failure, unspecified acute renal failure type ([place]) [N17.9] Acute renal failure superimposed on stage 3 chronic kidney disease, unspecified acute renal failure type, unspecified whether stage 3a or 3b CKD ([place]) [N17.9, N18.30] Patient Active Problem List   Diagnosis Date Noted   Acute renal failure superimposed on stage 3 chronic kidney disease, unspecified acute renal failure type, unspecified whether stage 3a or 3b CKD ([place]) 02/18/2022   Traumatic pneumothorax 02/18/2022   Multiple fractures of ribs, left side, initial encounter for closed fracture 02/18/2022   Pneumothorax, closed, traumatic 02/18/2022   Stroke (cerebrum) ([place]) 11/07/2021   Multiple myeloma ([place]) 09/29/2021   Multiple myeloma not having achieved remission ([place]) 09/29/2021   Chronic hyponatremia 08/18/2021   History of CVA (cerebrovascular accident) 08/13/2021   Syncope and collapse 08/09/2021   Hepatic lesion  08/05/2021   Dyslipidemia 05/08/2021   History of substance abuse ([place]) 12/09/2020   Cocaine abuse ([place]) 11/30/2020   Anemia of chronic disease 11/08/2020   Muscle twitching 09/12/2020   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   Pre-syncope 08/01/2020   Heart block AV first  degree 08/01/2020   Anemia of chronic kidney failure, stage 3 (moderate) ([place]) 05/28/2020   PUD (peptic ulcer disease)    Esophageal dysphagia    Encounter for screening colonoscopy    Generalized weakness 07/28/2019   Hypoglycemia 04/27/2019   Unresponsiveness 04/27/2019   Lung nodule 04/07/2019   Uncontrolled type 1 diabetes mellitus    Acute renal failure with acute tubular necrosis superimposed on stage 3a chronic kidney disease ([place])    AKI (acute kidney injury) ([place]) 04/05/2019   CKD (chronic kidney disease) stage 3, GFR 30-59 ml/min ([place]) 04/05/2019   Hypercholesterolemia 01/24/2016   Essential (primary) hypertension 06/07/2006   Type 1 diabetes mellitus with diabetic neuropathy, unspecified ([place]) 05/31/2006   PCP:  Dion Body, MD Pharmacy:   [facility] [address]), [place]) [place] [address] Phone: [phone] Fax: [phone]  [facility] [place], [place] [address] Phone: [phone] Fax: [phone]  [place] [place] [place] [address] Phone: [phone] Fax: [phone]  [name] [address] [place] [address] Phone: [phone] Fax: [phone]     Social Determinants of Health (SDOH) Interventions    Readmission Risk Interventions    02/25/2022    2:43 PM 12/08/2021   10:33 AM 11/27/2021    2:55 PM  Readmission Risk Prevention Plan  Transportation Screening Complete Complete Complete  Medication Review ([place]) Complete Complete Complete  PCP or Specialist appointment within 3-5 days of discharge Complete Complete Complete  HRI or Home Care Consult Complete Complete Complete  SW Recovery Care/Counseling Consult  Complete Complete  Palliative Care Screening Not Applicable  Not Applicable Not [place] Not Applicable Not Applicable Not Applicable

## 2022-02-25 NOTE — Progress Notes (Signed)
Occupational Therapy Treatment Patient Details Name: Nicholas Weiss MRN: [id number] DOB: 04-22-1950 Today's Date: 02/25/2022   History of present illness Pt is a 71 y.o. M who presents after a fall with traumatic PTX, left rib fxs, L1 compression fx, ARF, hyperglycemia. Significant PMH: DM1, multiple myeloma, CKD stage III.   OT comments  Pt progressing towards goals, needing mod A for UB dressing to don brace, pt able to doff brace without physical assist. Pt needing min guard A for LB dressing, toilet transfer, pericare, and walk-in shower transfer. Pt with HR varying 80-180 with mobility. Pt with slow processing, needing increased cues for safety. Pt presenting with impairments listed below, will follow acutely. Continue to recommend HHOT at d/c.   Recommendations for follow up therapy are one component of a multi-disciplinary discharge planning process, led by the attending physician.  Recommendations may be updated based on patient status, additional functional criteria and insurance authorization.    Follow Up Recommendations  Home health OT     Assistance Recommended at Discharge Frequent or constant Supervision/Assistance  Patient can return home with the following  A little help with walking and/or transfers;A little help with bathing/dressing/bathroom;Assistance with cooking/housework;Assist for transportation;Help with stairs or ramp for entrance   Equipment Recommendations  None recommended by OT    Recommendations for Other Services      Precautions / Restrictions Precautions Precautions: Fall Precaution Comments: L rib fx, back prec for comfort,watch HR Required Braces or Orthoses: Spinal Brace Spinal Brace: Applied in sitting position;Thoracolumbosacral orthotic Restrictions Weight Bearing Restrictions: No       Mobility Bed Mobility               General bed mobility comments: seated EOB upon arrival, reiterated sequence of log rolling for pt     Transfers Overall transfer level: Needs assistance Equipment used: Rolling walker (2 wheels) Transfers: Sit to/from Stand Sit to Stand: Min guard                 Balance Overall balance assessment: Needs assistance Sitting-balance support: Feet supported Sitting balance-Leahy Scale: Fair     Standing balance support: Bilateral upper extremity supported, During functional activity Standing balance-Leahy Scale: Fair Standing balance comment: can static stand without external support                           ADL either performed or assessed with clinical judgement   ADL Overall ADL's : Needs assistance/impaired                 Upper Body Dressing : Moderate assistance;Sitting Upper Body Dressing Details (indicate cue type and reason): to don brace and gown on backside Lower Body Dressing: Min guard;Sitting/lateral leans Lower Body Dressing Details (indicate cue type and reason): can perform figure 4 to don socks Toilet Transfer: Min guard;Ambulation;Regular Toilet;Rolling walker (2 wheels)   Toileting- Clothing Manipulation and Hygiene: Set up;Sitting/lateral lean   Tub/ Shower Transfer: Min guard;Ambulation;Walk-in shower;Rolling walker (2 wheels)   Functional mobility during ADLs: Rolling walker (2 wheels);Min guard      Extremity/Trunk Assessment Upper Extremity Assessment Upper Extremity Assessment: Overall WFL for tasks assessed   Lower Extremity Assessment Lower Extremity Assessment: Defer to PT evaluation        Vision       Perception Perception Perception: Not tested   Praxis Praxis Praxis: Not tested    Cognition Arousal/Alertness: Awake/alert Behavior During Therapy: [facility] for tasks assessed/performed Overall Cognitive  Status: Impaired/Different from baseline Area of Impairment: Problem solving, Awareness, Safety/judgement, Following commands, Memory, Attention, Orientation                 Orientation Level:  Situation Current Attention Level: Selective Memory: Decreased recall of precautions, Decreased short-term memory Following Commands: Follows one step commands with increased time Safety/Judgement: Decreased awareness of safety, Decreased awareness of deficits Awareness: Emergent Problem Solving: Slow processing, Difficulty sequencing, Requires verbal cues General Comments: difficutly recalling back precautions, needing mod cues for sequencing tasks during session        Exercises      Shoulder Instructions       General Comments HR 80-180 sporadic throughout session with mobility    Pertinent Vitals/ Pain       Pain Assessment Pain Assessment: Faces Pain Score: 4  Faces Pain Scale: Hurts little more Pain Location: L ribs Pain Descriptors / Indicators: Grimacing, Guarding Pain Intervention(s): Limited activity within patient's tolerance  Home Living                                          Prior Functioning/Environment              Frequency  Min 2X/week        Progress Toward Goals  OT Goals(current goals can now be found in the care plan section)  Progress towards OT goals: Progressing toward goals  Acute Rehab OT Goals OT Goal Formulation: With patient Time For Goal Achievement: 03/06/22 Potential to Achieve Goals: Good ADL Goals Pt Will Perform Grooming: with supervision;standing Pt Will Transfer to Toilet: with supervision;ambulating;regular height toilet Pt Will Perform Toileting - Clothing Manipulation and hygiene: with supervision;sit to/from stand Additional ADL Goal #1: Pt will complete basic ADLs with supervision.  Plan Discharge plan remains appropriate;Frequency remains appropriate    Co-evaluation                 AM-PAC OT "6 Clicks" Daily Activity     Outcome Measure   Help from another person eating meals?: None Help from another person taking care of personal grooming?: A Little Help from another person  toileting, which includes using toliet, bedpan, or urinal?: A Little Help from another person bathing (including washing, rinsing, drying)?: A Little Help from another person to put on and taking off regular upper body clothing?: A Little Help from another person to put on and taking off regular lower body clothing?: A Little 6 Click Score: 19    End of Session Equipment Utilized During Treatment: Rolling walker (2 wheels);Back brace  OT Visit Diagnosis: Unsteadiness on feet (R26.81);Other abnormalities of gait and mobility (R26.89);Pain;Muscle weakness (generalized) (M62.81);History of falling (Z91.81);Other symptoms and signs involving cognitive function   Activity Tolerance Patient tolerated treatment well   Patient Left Other (comment) (seated at sink with RN/NT for bathing)   Nurse Communication Mobility status;Other (comment) (HR)        Time: 7616-0737 OT Time Calculation (min): 33 min  Charges: OT General Charges $OT Visit: 1 Visit OT Treatments $Self Care/Home Management : 23-37 mins  Renaye Rakers, OTD, OTR/L SecureChat Preferred Acute Rehab [phone]   Renaye Rakers [place] 02/25/2022, 10:36 AM

## 2022-02-25 NOTE — Discharge Summary (Signed)
Physician Discharge Summary  Nicholas Weiss GLO:[id number] DOB: 04-Jan-1951 DOA: 02/17/2022  PCP: Dion Body, MD  Admit date: 02/17/2022 Discharge date: 02/25/2022 Recommendations for Outpatient Follow-up:  Follow up with PCP in 1 weeks-call for appointment Please obtain BMP/CBC in one week Fu for cxr in 2 wk and with [facility] for cxr results F/u with Dr Marcello Moores neurosurgery for back fracture. Cont with TSLO brace while ambulating  Discharge Dispo: Home w/ [place] Discharge Condition: Stable Code Status:   Code Status: Full Code Diet recommendation:  Diet Order             Diet regular Room service appropriate? Yes; Fluid consistency: Thin  Diet effective now                   Brief/Interim Summary: 71 year old with a history of cocaine abuse, DM 1, MM, and CKD stage IIIa with baseline creatinine 1.6 who was transferred to [facility] from the [facility] ER after he was found to have a traumatic pneumothorax.  The patient reported getting out of bed and stumbling around his house during which he fell at least 3 times, eventually striking the left side of his chest against his stove.  He called EMS for transport due to severe pain. Seen in ED-found to be in acute renal failure with a creatinine of 4.0, nephrology was consulted and patient was admitted for further management.  Patient had chest tube in place being managed by general surgery, chest tube removed 11/26. Patient having issues with hyper and hypoglycemia> appears to have brittle diabetes No more hypoglycemia since 11/26 10 PM when blood sugar was around 45.  At this time patient is doing well, chest pain is improved, chest tube is out doing well on room air did well with PT OT advised home health PT OT upon discharge.  No more hypoglycemia we will continue Tresiba 4 units and sliding scale insulin at home Advised to follow-up with PCP in a week BP soft will hold  hypertensives on discharge Discharge Diagnoses:  Principal  Problem:   Acute renal failure superimposed on stage 3 chronic kidney disease, unspecified acute renal failure type, unspecified whether stage 3a or 3b CKD (HCC) Active Problems:   Hypoglycemia   Cocaine abuse ([place])   Syncope and collapse   Traumatic pneumothorax   Multiple fractures of ribs, left side, initial encounter for closed fracture   Type 1 diabetes mellitus with diabetic neuropathy, unspecified (HCC)   Essential (primary) hypertension   Multiple myeloma (HCC)   Pneumothorax, closed, traumatic   AKI on CKD IIIa:creatinine peaked at 4.9 BUN 96 nicely improved with volume resuscitation.  Baseline labs labile ranging from 1.3-1.8.  Stable at baseline.  Encourage oral hydration.  Patient tachycardic and also somewhat dizzy on trying to sit up, BP had been soft in 90s>  ordered normal saline 1 L x 1, heart rate and BP stable Recent Labs    02/18/22 0314 02/18/22 0844 02/18/22 1150 02/18/22 2118 02/19/22 0034 02/20/22 0039 02/21/22 0043 02/22/22 0046 02/23/22 0050 02/25/22 1127  BUN 79* 70* 65* 54* 51* 37* 27* 30* 35* 28*  CREATININE 3.67* 3.05* 2.60* 2.36* 2.09* 1.78* 1.69* 1.89* 1.82* 1.63*   Fall with left 8-9 rib fractures with pneumothorax: Managed with chest tube> removed 11/26 follow-up chest x-ray remains stable, on RA.  Again rib fracture noted on chest x-ray and stable, continue Tylenol has not been needing oxycodone. L1 superior endplate compression fracture:NS reports no intervention currently required - TLSO brace  recommended when out of bed - f/u w/ NS as outpt.   T1DM with uncontrolled hyperglycemia and refractory hypoglycemia,Brittle DM : On admission no evidence of DKA.  Patient had episodes of hypoglycemia likely in the setting of renal failure/Tresiba - takes 20 u at home.  CBG 10 PM 11/26 as low as 45, prior to that received 9 units of insulin 530 pm for cbg of 401, had Semglee 6 units around 10 AM> was on dextrose ivf and push> no longer hypoglycemia, off d5  ivf.patient reports he is well aware how to manage blood sugar at home seen by diabetes coordinator.  He will continue [name] at home dose 4 units and sliding scale insulin advised to check q. ACH blood sugar at home.  He is to follow-up with PCP/endocrinology for ongoing management of his diabetes mellitus which appears to be brittle.  Recent Labs  Lab 02/25/22 0407 02/25/22 0630 02/25/22 0802 02/25/22 0951 02/25/22 1109  GLUCAP 231* 251* 206* 172* 271*     Hyperkalemia due to AKI.Resolved.monitor. Cocaine abuse: He is not interested in abstaining it seems. Multiple myeloma hx/mild leukopenia-advised outpatient follow-up HTN:Bp soft- will hld off further meds> he is to follow-up with PCP to reinitiate this medication monitor blood pressure at home Transient low-grade fever 11/28> no recurrence, no urinary symptoms/cough, no leukocytosis has mild leukopenia.  If he has recurrent fever at home return to the ED or contact MD  Consults: Surgery, diabetes coordinator Subjective: Alert awake oriented resting comfortably no more hypoglycemia tolerating diet ambulating with PT.  Eager to go home today.  Discharge Exam: Vitals:   02/25/22 0724 02/25/22 1108  BP: 119/75 93/64  Pulse: 86 92  Resp: (!) 21 15  Temp: 99.8 F (37.7 C) 97.6 F (36.4 C)  SpO2: 97% 98%   General: Pt is alert, awake, not in acute distress Cardiovascular: RRR, S1/S2 +, no rubs, no gallops Respiratory: CTA bilaterally, no wheezing, no rhonchi Abdominal: Soft, NT, ND, bowel sounds + Extremities: no edema, no cyanosis  Discharge Instructions  Discharge Instructions     Discharge instructions   Complete by: As directed    Please follow-up with the radiology imaging and the surgeon as scheduled Follow-up with your primary care doctor within a week  Please continue with diabetic care at home as below Check blood sugar 3 times a day and bedtime at home. If blood sugar running above 200 less than 70 please  call your MD to adjust insulin. If blood sugars running less 100 do not use insulin and call MD. If you noticed signs and symptoms of hypoglycemia or low blood sugar like jitteriness, confusion, thirst, tremor, sweating- Check blood sugar, drink sugary drink/biscuits/sweets to increase sugar level and call MD or return to ER.   Increase activity slowly   Complete by: As directed       Allergies as of 02/25/2022       Reactions   Penicillins Other (See Comments)   Childhood allergy Unknown reaction Has patient had a PCN reaction causing immediate rash, facial/tongue/throat swelling, SOB or lightheadedness with hypotension: No Has patient had a PCN reaction causing severe rash involving mucus membranes or skin necrosis: No Has patient had a PCN reaction that required hospitalization No Has patient had a PCN reaction occurring within the last 10 years: No If all of the above answers are "NO", then may proceed with Cephalosporin use.        Medication List     STOP taking these medications  amLODipine 10 MG tablet Commonly known as: NORVASC   lisinopril 10 MG tablet Commonly known as: ZESTRIL       TAKE these medications    acetaminophen 500 MG tablet Commonly known as: TYLENOL Take 1,000 mg by mouth daily as needed for mild pain or moderate pain.   acyclovir 400 MG tablet Commonly known as: ZOVIRAX Take 400 mg by mouth in the morning.   aspirin EC 81 MG tablet Take 81 mg by mouth in the morning.   atorvastatin 80 MG tablet Commonly known as: LIPITOR Take 1 tablet (80 mg total) by mouth at bedtime. What changed: when to take this   Baclofen 5 MG Tabs Take 5 mg by mouth daily as needed (muscle spasm).   clopidogrel 75 MG tablet Commonly known as: PLAVIX Take 75 mg by mouth in the morning.   dicyclomine 20 MG tablet Commonly known as: BENTYL Take 1 tablet (20 mg total) by mouth 3 (three) times daily with meals as needed for spasms.   Farxiga 10 MG Tabs  tablet Generic drug: dapagliflozin propanediol Take 10 mg by mouth in the morning.   gabapentin 300 MG capsule Commonly known as: NEURONTIN Take 300-600 mg by mouth See admin instructions. 300 mg in the morning, 600 mg at night.   lenalidomide 10 MG capsule Commonly known as: REVLIMID TAKE 1 CAPSULE BY MOUTH DAILY FOR 14 DAYS, THEN HOLD FOR 7 DAYS. REPEAT EVERY 21 DAYS What changed:  how much to take how to take this when to take this additional instructions   NovoLOG 100 UNIT/ML injection Generic drug: insulin aspart INJECT UP TO 14 UNITS THREE TIMES DAILY BEFORE MEALS ACCORDING TO SLIDING SCALE What changed: See the new instructions.   ondansetron 8 MG tablet Commonly known as: ZOFRAN Take 8 mg by mouth daily as needed for nausea or vomiting.   prochlorperazine 10 MG tablet Commonly known as: COMPAZINE Take 10 mg by mouth daily as needed for nausea or vomiting.   [name] FlexTouch 100 UNIT/ML FlexTouch Pen Generic drug: insulin degludec Inject 4 Units into the skin in the morning.        Follow-up Information     Vallarie Mare, MD. Schedule an appointment as soon as possible for a visit in 6 week(s).   Specialty: Neurosurgery Why: Follow up regarding back fracture Contact information: [address]  [place] [address] [phone]         [facility]. Go in 2 week(s).   Why: For follow up chest x-ray, we will call you from the trauma clinic with results and advise if any further actions needed. Contact information: [address] [place] Follow up.   Why: No follow up appointment scheduled, we will call with results of follow up chest x-ray and advise if appointment needed. Contact information: [address] [address] [phone]        Dion Body, MD. Schedule an appointment as soon as possible for a visit in  1 week(s).   Specialty: Family Medicine Why: post-hospitalization follow up Contact information: [place] [address] [phone]                Allergies  Allergen Reactions   Penicillins Other (See Comments)    Childhood allergy Unknown reaction Has patient had a PCN reaction causing immediate rash, facial/tongue/throat swelling, SOB  or lightheadedness with hypotension: No Has patient had a PCN reaction causing severe rash involving mucus membranes or skin necrosis: No Has patient had a PCN reaction that required hospitalization No Has patient had a PCN reaction occurring within the last 10 years: No If all of the above answers are "NO", then may proceed with Cephalosporin use.     The results of significant diagnostics from this hospitalization (including imaging, microbiology, ancillary and laboratory) are listed below for reference.    Microbiology: No results found for this or any previous visit (from the past 240 hour(s)).  Procedures/Studies: DG Chest Port 1 View  Result Date: 02/24/2022 CLINICAL DATA:  Chest pain history of fall EXAM: PORTABLE CHEST 1 VIEW COMPARISON:  Chest radiograph 1 day prior FINDINGS: The heart is enlarged, unchanged. The upper mediastinal contours are stable. Aeration of the lung bases is improved compared to the study from 1 day prior. There is no new or worsening focal airspace disease. There is a probable trace left pleural effusion. There is no significant right effusion. There is no pneumothorax The IMPRESSION: 1. Improved aeration of the lung bases since the study from 1 day prior with a probable trace residual left pleural effusion. No appreciable pneumothorax. 2. Unchanged left ninth rib fracture and associated subcutaneous emphysema. Electronically Signed   By: Valetta Mole M.D.   On: 02/24/2022 10:55   DG CHEST PORT 1 VIEW  Result Date: 02/23/2022 CLINICAL DATA:  Follow-up left  pneumothorax. EXAM: PORTABLE CHEST 1 VIEW COMPARISON:  02/22/2022.  CT, 02/17/2022. FINDINGS: No visualized pneumothorax. Left anterolateral and neck base subcutaneous emphysema is stable. Mildly displaced fracture of the left lateral ninth rib, also unchanged. Bilateral lung base opacities consistent with atelectasis, stable on the left, improved on the right the previous exam. Remainder of the lungs is clear. IMPRESSION: 1. No visualized residual left pneumothorax. 2. Improved right lung base atelectasis. No other change from the previous exam. Electronically Signed   By: Lajean Manes M.D.   On: 02/23/2022 10:10   DG CHEST PORT 1 VIEW  Result Date: 02/22/2022 CLINICAL DATA:  Pneumothorax. EXAM: PORTABLE CHEST 1 VIEW COMPARISON:  February 22, 2022. FINDINGS: The heart size and mediastinal contours are within normal limits. Left-sided chest tube has been removed. Minimal left apical pneumothorax is noted. Bibasilar atelectasis is noted. Stable subcutaneous emphysema is seen over left lateral chest wall and supraclavicular region. The visualized skeletal structures are unremarkable. IMPRESSION: Minimal left apical pneumothorax is noted status post left-sided chest tube removal. Stable subcutaneous emphysema is noted. Mild bibasilar atelectasis is noted. Electronically Signed   By: Marijo Conception M.D.   On: 02/22/2022 12:24   DG CHEST PORT 1 VIEW  Result Date: 02/22/2022 CLINICAL DATA:  Chest tube EXAM: PORTABLE CHEST 1 VIEW COMPARISON:  02/21/2022 FINDINGS: Left chest tube remains in place without evidence for pneumothorax. Skin fold projects over the left lung apex. Subsegmental atelectasis noted right base. The cardiopericardial silhouette is within normal limits for size. Subcutaneous emphysema persists in the left chest wall. Telemetry leads overlie the chest. IMPRESSION: Left chest tube remains in place without evidence for pneumothorax. Electronically Signed   By: Misty Stanley M.D.   On: 02/22/2022  09:30   DG CHEST PORT 1 VIEW  Result Date: 02/21/2022 CLINICAL DATA:  Follow-up left-sided pneumothorax. EXAM: PORTABLE CHEST 1 VIEW COMPARISON:  Chest x-ray 02/20/2022 FINDINGS: The left sided chest tube is stable. No pneumothorax. Stable subcutaneous emphysema. Persistent right lower lobe atelectasis. IMPRESSION: Stable left sided  chest tube without pneumothorax. Stable subsegmental atelectasis at the right lung base. Electronically Signed   By: Marijo Sanes M.D.   On: 02/21/2022 11:29   DG CHEST PORT 1 VIEW  Result Date: 02/20/2022 CLINICAL DATA:  LEFT pneumothorax, follow-up EXAM: PORTABLE CHEST 1 VIEW COMPARISON:  Portable exam 0651 hours compared to 02/19/2022 FINDINGS: Pigtail LEFT thoracostomy tube unchanged. Upper normal heart size. Mediastinal contours and pulmonary vascularity normal. Atherosclerotic calcification aorta. Bibasilar atelectasis greater on RIGHT. Skin fold projects over LEFT chest. No acute infiltrate, pleural effusion, or pneumothorax. LEFT chest wall emphysema extending into LEFT cervical region. IMPRESSION: Bibasilar atelectasis greater on RIGHT. LEFT thoracostomy tube without pneumothorax. Aortic Atherosclerosis (ICD10-I70.0). Electronically Signed   By: Lavonia Dana M.D.   On: 02/20/2022 08:38   DG CHEST PORT 1 VIEW  Result Date: 02/19/2022 CLINICAL DATA:  Trauma.  Rib fracture. EXAM: PORTABLE CHEST 1 VIEW COMPARISON:  02/18/2022 FINDINGS: Stable asymmetric elevation right hemidiaphragm with right base atelectasis. Left pleural drain again noted without definite evidence for left-sided pneumothorax. Subcutaneous emphysema noted left chest wall. Cardiopericardial silhouette is at upper limits of normal for size. Lower left rib fracture evident. IMPRESSION: 1. Left pleural drain without definite evidence for pneumothorax. Left heart border and left hemidiaphragm are well defined raising the question of anteromedial pleural air. 2. Stable asymmetric elevation right  hemidiaphragm with right base atelectasis. Electronically Signed   By: Misty Stanley M.D.   On: 02/19/2022 08:48   DG Chest Portable 1 View  Result Date: 02/18/2022 CLINICAL DATA:  LEFT chest tube placement EXAM: PORTABLE CHEST 1 VIEW COMPARISON:  Portable exam 0944 hours compared to 0130 hours FINDINGS: Pigtail LEFT thoracostomy tube identified with significant decrease in previously seen LEFT pneumothorax. Small loculated pneumothorax at medial lower LEFT lung. Normal heart size, mediastinal contours, and pulmonary vascularity. Subsegmental atelectasis RIGHT base, mildly in retrocardiac LEFT lower lobe. No pleural effusion or infiltrate seen. Atherosclerotic calcification aorta. LEFT chest wall emphysema extending into cervical region. Displaced fracture of the posterolateral LEFT ninth rib noted. IMPRESSION: Significant decrease in LEFT pneumothorax following chest tube insertion. Bibasilar atelectasis. Displaced fracture posterolateral LEFT ninth rib. Electronically Signed   By: Lavonia Dana M.D.   On: 02/18/2022 09:56   DG CHEST PORT 1 VIEW  Result Date: 02/18/2022 CLINICAL DATA:  Left pneumothorax. EXAM: PORTABLE CHEST 1 VIEW COMPARISON:  02/17/2022. FINDINGS: The heart size and mediastinal contours are stable. There is atherosclerotic calcification of the aorta. There is redemonstration of a left pneumothorax of approximately 23%, slightly increased from the prior exam. Subcutaneous emphysema is noted in the supraclavicular, cervical region, axilla, and lateral chest wall on the left. The right lung is clear. The previously described left rib fractures are not well seen on this exam. IMPRESSION: 1. Slightly increased pneumothorax on the left of approximately 23%. 2. Stable subcutaneous emphysema. Electronically Signed   By: Brett Fairy M.D.   On: 02/18/2022 01:43   DG Chest Portable 1 View  Result Date: 02/17/2022 CLINICAL DATA:  Evaluate pneumothorax.  Fall. EXAM: PORTABLE CHEST 1 VIEW  COMPARISON:  Chest x-ray 12/06/2021.  Chest CT 02/17/2022. FINDINGS: Small left pneumothorax is present (proximally 20%). This is stable to minimally increased compared to the prior CT given differences in technique. Left chest wall emphysema is again seen. There is no evidence for mediastinal shift or focal lung infiltrate. The cardiomediastinal silhouette is within normal limits. Osseous structures appear stable. IMPRESSION: 1. Small left pneumothorax, stable to minimally increased compared to the prior CT  given differences in technique. 2. Left chest wall emphysema. Electronically Signed   By: Ronney Asters M.D.   On: 02/17/2022 21:35   CT ABDOMEN PELVIS WO CONTRAST  Result Date: 02/17/2022 CLINICAL DATA:  Blunt abdominal trauma. Fall getting out of bed. Additional falls in the kitchen. EXAM: CT ABDOMEN AND PELVIS WITHOUT CONTRAST TECHNIQUE: Multidetector CT imaging of the abdomen and pelvis was performed following the standard protocol without IV contrast. RADIATION DOSE REDUCTION: This exam was performed according to the departmental dose-optimization program which includes automated exposure control, adjustment of the mA and/or kV according to patient size and/or use of iterative reconstruction technique. COMPARISON:  Included portions from chest CT earlier today. Abdominopelvic CT 08/10/2021 FINDINGS: Lower chest: Left basilar pneumothorax, increased from chest CT earlier today. Again seen subcutaneous emphysema in the left chest wall. Left lateral eighth and ninth rib fractures again seen. Hepatobiliary: Lack of contrast limits detailed assessment, allowing for this there is no evidence of injury. Gallbladder physiologically distended, no calcified stone. No biliary dilatation. Pancreas: Unremarkable unenhanced appearance, no evidence of injury. Spleen: Lack of IV contrast limits detailed assessment. Allowing for this, no evidence of splenic injury. No perisplenic free fluid. Adrenals/Urinary Tract: No  adrenal hemorrhage or renal injury identified. Bladder is unremarkable. Stomach/Bowel: No obvious bowel injury on this unenhanced exam. There is mild fluid distention of the duodenum. Scattered fluid within nondilated small bowel. No definite bowel wall thickening or inflammation. Moderate to large colonic stool burden. Normal appendix visualized. Vascular/Lymphatic: Advanced aortic atherosclerosis. There is no perivascular stranding or retroperitoneal fluid to suggest injury. No bulky abdominopelvic adenopathy. Reproductive: Enlarged prostate gland causes mass effect on the bladder base. Other: No free air or free fluid. Musculoskeletal: Unchanged appearance of mild L1 superior endplate compression fracture from CT earlier today. No acute fracture of the pelvis. IMPRESSION: 1. Left basilar pneumothorax, increased from chest CT earlier today. Left lateral eighth and ninth rib fractures again seen. 2. No evidence of acute traumatic injury to the abdomen or pelvis allowing for limitations related to lack of IV contrast. 3. Unchanged appearance of mild L1 superior endplate compression fracture from CT earlier today. 4. Enlarged prostate gland causes mass effect on the bladder base. Aortic Atherosclerosis (ICD10-I70.0). Electronically Signed   By: Keith Rake M.D.   On: 02/17/2022 20:46   CT Head Wo Contrast  Result Date: 02/17/2022 CLINICAL DATA:  Fall. EXAM: CT HEAD WITHOUT CONTRAST CT MAXILLOFACIAL WITHOUT CONTRAST CT CERVICAL SPINE WITHOUT CONTRAST TECHNIQUE: Multidetector CT imaging of the head, cervical spine, and maxillofacial structures were performed using the standard protocol without intravenous contrast. Multiplanar CT image reconstructions of the cervical spine and maxillofacial structures were also generated. RADIATION DOSE REDUCTION: This exam was performed according to the departmental dose-optimization program which includes automated exposure control, adjustment of the mA and/or kV according  to patient size and/or use of iterative reconstruction technique. COMPARISON:  MRI brain dated Aug 10, 2021. CT head dated May 07, 2021. CT cervical spine dated February 27, 2021. FINDINGS: CT HEAD FINDINGS Brain: No evidence of acute infarction, hemorrhage, hydrocephalus, extra-axial collection or mass lesion/mass effect. Stable mild atrophy and chronic microvascular ischemic changes. Vascular: Atherosclerotic vascular calcification of the carotid siphons. No hyperdense vessel. Skull: Normal. Negative for fracture or focal lesion. Other: None. CT MAXILLOFACIAL FINDINGS Osseous: No fracture or mandibular dislocation. No destructive process. Orbits: Negative. No traumatic or inflammatory finding. Sinuses: Tiny retention cysts in the left maxillary sinus. Remaining paranasal sinuses and mastoid air cells are clear. Soft  tissues: Negative. CT CERVICAL SPINE FINDINGS Alignment: No traumatic malalignment. Skull base and vertebrae: No acute fracture. No primary bone lesion or focal pathologic process. Soft tissues and spinal canal: No prevertebral fluid or swelling. No visible canal hematoma. Disc levels: Moderate disc height loss and uncovertebral hypertrophy at C5-C6 and C6-C7. Moderate left facet arthropathy at C3-C4. Upper chest: Please see separate CT chest report from same day. Other: None. IMPRESSION: 1. No acute intracranial abnormality. 2. No acute facial fracture. 3. No acute cervical spine fracture or traumatic malalignment. Electronically Signed   By: Titus Dubin M.D.   On: 02/17/2022 19:19   CT Maxillofacial Wo Contrast  Result Date: 02/17/2022 CLINICAL DATA:  Fall. EXAM: CT HEAD WITHOUT CONTRAST CT MAXILLOFACIAL WITHOUT CONTRAST CT CERVICAL SPINE WITHOUT CONTRAST TECHNIQUE: Multidetector CT imaging of the head, cervical spine, and maxillofacial structures were performed using the standard protocol without intravenous contrast. Multiplanar CT image reconstructions of the cervical spine and  maxillofacial structures were also generated. RADIATION DOSE REDUCTION: This exam was performed according to the departmental dose-optimization program which includes automated exposure control, adjustment of the mA and/or kV according to patient size and/or use of iterative reconstruction technique. COMPARISON:  MRI brain dated Aug 10, 2021. CT head dated May 07, 2021. CT cervical spine dated February 27, 2021. FINDINGS: CT HEAD FINDINGS Brain: No evidence of acute infarction, hemorrhage, hydrocephalus, extra-axial collection or mass lesion/mass effect. Stable mild atrophy and chronic microvascular ischemic changes. Vascular: Atherosclerotic vascular calcification of the carotid siphons. No hyperdense vessel. Skull: Normal. Negative for fracture or focal lesion. Other: None. CT MAXILLOFACIAL FINDINGS Osseous: No fracture or mandibular dislocation. No destructive process. Orbits: Negative. No traumatic or inflammatory finding. Sinuses: Tiny retention cysts in the left maxillary sinus. Remaining paranasal sinuses and mastoid air cells are clear. Soft tissues: Negative. CT CERVICAL SPINE FINDINGS Alignment: No traumatic malalignment. Skull base and vertebrae: No acute fracture. No primary bone lesion or focal pathologic process. Soft tissues and spinal canal: No prevertebral fluid or swelling. No visible canal hematoma. Disc levels: Moderate disc height loss and uncovertebral hypertrophy at C5-C6 and C6-C7. Moderate left facet arthropathy at C3-C4. Upper chest: Please see separate CT chest report from same day. Other: None. IMPRESSION: 1. No acute intracranial abnormality. 2. No acute facial fracture. 3. No acute cervical spine fracture or traumatic malalignment. Electronically Signed   By: Titus Dubin M.D.   On: 02/17/2022 19:19   CT Cervical Spine Wo Contrast  Result Date: 02/17/2022 CLINICAL DATA:  Fall. EXAM: CT HEAD WITHOUT CONTRAST CT MAXILLOFACIAL WITHOUT CONTRAST CT CERVICAL SPINE WITHOUT CONTRAST  TECHNIQUE: Multidetector CT imaging of the head, cervical spine, and maxillofacial structures were performed using the standard protocol without intravenous contrast. Multiplanar CT image reconstructions of the cervical spine and maxillofacial structures were also generated. RADIATION DOSE REDUCTION: This exam was performed according to the departmental dose-optimization program which includes automated exposure control, adjustment of the mA and/or kV according to patient size and/or use of iterative reconstruction technique. COMPARISON:  MRI brain dated Aug 10, 2021. CT head dated May 07, 2021. CT cervical spine dated February 27, 2021. FINDINGS: CT HEAD FINDINGS Brain: No evidence of acute infarction, hemorrhage, hydrocephalus, extra-axial collection or mass lesion/mass effect. Stable mild atrophy and chronic microvascular ischemic changes. Vascular: Atherosclerotic vascular calcification of the carotid siphons. No hyperdense vessel. Skull: Normal. Negative for fracture or focal lesion. Other: None. CT MAXILLOFACIAL FINDINGS Osseous: No fracture or mandibular dislocation. No destructive process. Orbits: Negative. No traumatic or inflammatory finding.  Sinuses: Tiny retention cysts in the left maxillary sinus. Remaining paranasal sinuses and mastoid air cells are clear. Soft tissues: Negative. CT CERVICAL SPINE FINDINGS Alignment: No traumatic malalignment. Skull base and vertebrae: No acute fracture. No primary bone lesion or focal pathologic process. Soft tissues and spinal canal: No prevertebral fluid or swelling. No visible canal hematoma. Disc levels: Moderate disc height loss and uncovertebral hypertrophy at C5-C6 and C6-C7. Moderate left facet arthropathy at C3-C4. Upper chest: Please see separate CT chest report from same day. Other: None. IMPRESSION: 1. No acute intracranial abnormality. 2. No acute facial fracture. 3. No acute cervical spine fracture or traumatic malalignment. Electronically Signed    By: Titus Dubin M.D.   On: 02/17/2022 19:19   CT CHEST WO CONTRAST  Result Date: 02/17/2022 CLINICAL DATA:  Left-sided rib pain after a fall. EXAM: CT CHEST WITHOUT CONTRAST TECHNIQUE: Multidetector CT imaging of the chest was performed following the standard protocol without IV contrast. RADIATION DOSE REDUCTION: This exam was performed according to the departmental dose-optimization program which includes automated exposure control, adjustment of the mA and/or kV according to patient size and/or use of iterative reconstruction technique. COMPARISON:  Chest x-ray dated December 06, 2021. PET-CT dated October 09, 2021. FINDINGS: Cardiovascular: No significant vascular findings. Normal heart size. No pericardial effusion. Coronary, aortic arch, and branch vessel atherosclerotic vascular disease. Mediastinum/Nodes: No enlarged mediastinal or axillary lymph nodes. Thyroid gland, trachea, and esophagus demonstrate no significant findings. Lungs/Pleura: Small left pneumothorax. Mild subsegmental atelectasis in the posterior left lower lobe. Unchanged calcified granuloma along the left major fissure. The right lung is clear. No consolidation or pleural effusion. Upper Abdomen: No acute abnormality. Musculoskeletal: Acute nondisplaced fracture of the left lateral eighth rib. Acute minimally displaced fracture of the left posterolateral ninth rib. Subcutaneous emphysema in the left posterolateral chest wall extending into the lower neck. Minimal L1 superior endplate compression fracture, new since July. IMPRESSION: 1. Acute fractures of the left eighth and ninth ribs with small left pneumothorax. 2. Minimal L1 superior endplate compression fracture, new since July. Correlate with point tenderness. 3.  Aortic atherosclerosis (ICD10-I70.0). Electronically Signed   By: Titus Dubin M.D.   On: 02/17/2022 19:08    Labs: BNP (last 3 results) No results for input(s): "BNP" in the last 8760 hours. Basic Metabolic  Panel: Recent Labs  Lab 02/19/22 0034 02/20/22 0039 02/21/22 0043 02/22/22 0046 02/23/22 0050 02/25/22 1127  NA 134* 138 138 138 135 132*  K 4.8 4.5 4.8 4.8 4.5 4.5  CL 109 110 107 110 104 100  CO2 19* 21* _0 21*  GLUCOSE 150* 62* 228* 97 154* 287*  BUN 51* 37* 27* 30* 35* 28*  CREATININE 2.09* 1.78* 1.69* 1.89* 1.82* 1.63*  CALCIUM 7.9* 8.0* 8.5* 8.3* 8.0* 8.6*  MG 2.2  --  2.1  --   --   --    Liver Function Tests: Recent Labs  Lab 02/18/22 2118 02/19/22 0034 02/21/22 0043  AST 36 37 27  ALT 42 44 37  ALKPHOS 50 51 61  BILITOT 0.5 0.3 0.6  PROT 6.0* 6.0* 7.1  ALBUMIN 2.7* 2.8* 2.9*   No results for input(s): "LIPASE", "AMYLASE" in the last 168 hours. No results for input(s): "AMMONIA" in the last 168 hours. CBC: Recent Labs  Lab 02/19/22 0034 02/20/22 0039 02/21/22 0043 02/23/22 0050 02/25/22 1127  WBC 2.4* 3.8* 7.7 5.4 2.9*  HGB 9.0* 9.4* 10.5* 8.2* 10.8*  HCT 27.9* 29.4* 31.9* 25.6* 33.2*  MCV  95.5 95.8 95.2 95.5 94.6  PLT 196 188 212 226 297   Cardiac Enzymes: No results for input(s): "CKTOTAL", "CKMB", "CKMBINDEX", "TROPONINI" in the last 168 hours. BNP: Invalid input(s): "POCBNP" CBG: Recent Labs  Lab 02/25/22 0407 02/25/22 0630 02/25/22 0802 02/25/22 0951 02/25/22 1109  GLUCAP 231* 251* 206* 172* 271*  Urinalysis    Component Value Date/Time   COLORURINE YELLOW (A) 02/17/2022 2240   APPEARANCEUR CLOUDY (A) 02/17/2022 2240   LABSPEC 1.015 02/17/2022 2240   PHURINE 7.0 02/17/2022 2240   GLUCOSEU >=500 (A) 02/17/2022 2240   HGBUR MODERATE (A) 02/17/2022 2240   BILIRUBINUR NEGATIVE 02/17/2022 2240   KETONESUR NEGATIVE 02/17/2022 2240   PROTEINUR 100 (A) 02/17/2022 2240   NITRITE NEGATIVE 02/17/2022 2240   LEUKOCYTESUR SMALL (A) 02/17/2022 2240   Sepsis Labs Recent Labs  Lab 02/20/22 0039 02/21/22 0043 02/23/22 0050 02/25/22 1127  WBC 3.8* 7.7 5.4 2.9*   Microbiology No results found for this or any previous visit (from  the past 240 hour(s)).  Time coordinating discharge: 35 minutes  SIGNED: Antonieta Pert, MD  [facility] 02/25/2022, 12:46 PM  If 7PM-7AM, please contact night-coverage [email or web address]

## 2022-02-25 NOTE — TOC Transition Note (Signed)
Transition of Care [facility]) - CM/SW Discharge Note   Patient Details  Name: Nicholas Weiss MRN: [id number] Date of Birth: 1950/10/11  [facility] CM/SW Contact:  Zenon Mayo, RN Phone Number: 02/25/2022, 2:50 PM   Clinical Narrative:    Patient is for dc today, NCM offered choice with Medicare. Gov list, he states he does not have a preference.  NCM made referral to [facility] with [place] , she is able to take referral.  Soc will begin 24 to 48 hrs post dc.  Patient states his significant other, Nicholas Weiss will be transporting him home today.  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   Final next level of care: [place] Barriers to Discharge: No Barriers Identified   Patient Goals and CMS Choice Patient states their goals for this hospitalization and ongoing recovery are:: return home CMS Medicare.gov Compare Post Acute Care list provided to:: Patient Choice offered to / list presented to : Patient  Discharge Placement                       Discharge Plan and Services In-house Referral: NA Discharge Planning Services: CM Consult Post Acute Care Choice: Home Health          DME Arranged: N/A DME Agency: NA       HH Arranged: PT, OT HH Agency: [place] Date [place]: 02/25/22 Time [place]: 7017 Representative spoke with at [place]: [place] ([place]) Interventions     Readmission Risk Interventions    02/25/2022    2:43 PM 12/08/2021   10:33 AM 11/27/2021    2:55 PM  Readmission Risk Prevention Plan  Transportation Screening Complete Complete Complete  Medication Review Press photographer) Complete Complete Complete  PCP or Specialist appointment within 3-5 days of discharge Complete Complete Complete  HRI or Home Care Consult Complete Complete Complete  SW Recovery Care/Counseling Consult  Complete Complete  Palliative Care Screening Not  Applicable Not Applicable Not [place] Not Applicable Not Applicable Not Applicable

## 2022-02-26 ENCOUNTER — Other Ambulatory Visit: Payer: Self-pay

## 2022-02-27 ENCOUNTER — Ambulatory Visit: Payer: Medicare (Managed Care) | Admitting: Oncology

## 2022-02-27 ENCOUNTER — Other Ambulatory Visit: Payer: Medicare Other

## 2022-02-27 ENCOUNTER — Ambulatory Visit: Payer: Medicare Other | Admitting: Oncology

## 2022-02-27 ENCOUNTER — Inpatient Hospital Stay: Payer: Medicare Other | Attending: Oncology

## 2022-02-27 ENCOUNTER — Telehealth: Payer: Self-pay | Admitting: *Deleted

## 2022-02-27 ENCOUNTER — Inpatient Hospital Stay: Payer: Medicare Other

## 2022-02-27 ENCOUNTER — Other Ambulatory Visit: Payer: Medicare (Managed Care)

## 2022-02-27 ENCOUNTER — Ambulatory Visit: Payer: Medicare Other

## 2022-02-27 ENCOUNTER — Ambulatory Visit: Payer: Medicare (Managed Care)

## 2022-02-27 DIAGNOSIS — Z79624 Long term (current) use of inhibitors of nucleotide synthesis: Secondary | ICD-10-CM | POA: Insufficient documentation

## 2022-02-27 DIAGNOSIS — Z7984 Long term (current) use of oral hypoglycemic drugs: Secondary | ICD-10-CM | POA: Insufficient documentation

## 2022-02-27 DIAGNOSIS — Z794 Long term (current) use of insulin: Secondary | ICD-10-CM | POA: Insufficient documentation

## 2022-02-27 DIAGNOSIS — E1022 Type 1 diabetes mellitus with diabetic chronic kidney disease: Secondary | ICD-10-CM | POA: Insufficient documentation

## 2022-02-27 DIAGNOSIS — N1832 Chronic kidney disease, stage 3b: Secondary | ICD-10-CM | POA: Insufficient documentation

## 2022-02-27 DIAGNOSIS — Z7961 Long term (current) use of immunomodulator: Secondary | ICD-10-CM | POA: Insufficient documentation

## 2022-02-27 DIAGNOSIS — D631 Anemia in chronic kidney disease: Secondary | ICD-10-CM | POA: Insufficient documentation

## 2022-02-27 DIAGNOSIS — Z79899 Other long term (current) drug therapy: Secondary | ICD-10-CM | POA: Insufficient documentation

## 2022-02-27 DIAGNOSIS — Z7902 Long term (current) use of antithrombotics/antiplatelets: Secondary | ICD-10-CM | POA: Insufficient documentation

## 2022-02-27 DIAGNOSIS — C9000 Multiple myeloma not having achieved remission: Secondary | ICD-10-CM | POA: Insufficient documentation

## 2022-02-27 DIAGNOSIS — I129 Hypertensive chronic kidney disease with stage 1 through stage 4 chronic kidney disease, or unspecified chronic kidney disease: Secondary | ICD-10-CM | POA: Insufficient documentation

## 2022-02-27 NOTE — Telephone Encounter (Signed)
Patient called to report that he has recently been hospitalized for a traumatic pneumothorax. H would like to know id he should restart his medication (Velcade?)

## 2022-02-27 NOTE — Telephone Encounter (Signed)
Jennifer-I see that there is a future appointment for him scheduled with me.  Can you please let him know?

## 2022-02-27 NOTE — Telephone Encounter (Signed)
I called the pt on cell phone and left message. The pt got out of hospital on 11/29. He has next appt for 12/8 and I needed to know if he has revlimid pills at home or if I need to get prescription for him to start on 12/8. I left message for pt to call me back

## 2022-03-03 MED ORDER — LENALIDOMIDE 10 MG PO CAPS: 10.0000 mg | ORAL_CAPSULE | Freq: Every day | ORAL | 0 refills | Status: DC

## 2022-03-03 NOTE — Telephone Encounter (Signed)
Patient reported to [facility] having 8 capsules on hand, Prescription resent to [facility] for 6 capsules so he has enough on hand to complete his 14 days on.  Called [facility] and cancelled Rx sent in for 14 capsules.

## 2022-03-03 NOTE — Addendum Note (Signed)
Addended by: Darl Pikes on: 03/03/2022 04:33 PM   Modules accepted: Orders

## 2022-03-06 ENCOUNTER — Inpatient Hospital Stay: Payer: Medicare Other

## 2022-03-06 ENCOUNTER — Encounter: Payer: Self-pay | Admitting: Oncology

## 2022-03-06 ENCOUNTER — Inpatient Hospital Stay (HOSPITAL_BASED_OUTPATIENT_CLINIC_OR_DEPARTMENT_OTHER): Payer: Medicare Other | Admitting: Oncology

## 2022-03-06 VITALS — BP 102/71 | HR 81 | Temp 96.7°F | Resp 16 | Ht 70.0 in | Wt 147.5 lb

## 2022-03-06 DIAGNOSIS — E1022 Type 1 diabetes mellitus with diabetic chronic kidney disease: Secondary | ICD-10-CM | POA: Diagnosis not present

## 2022-03-06 DIAGNOSIS — Z79899 Other long term (current) drug therapy: Secondary | ICD-10-CM | POA: Diagnosis not present

## 2022-03-06 DIAGNOSIS — I129 Hypertensive chronic kidney disease with stage 1 through stage 4 chronic kidney disease, or unspecified chronic kidney disease: Secondary | ICD-10-CM | POA: Diagnosis not present

## 2022-03-06 DIAGNOSIS — C9000 Multiple myeloma not having achieved remission: Secondary | ICD-10-CM

## 2022-03-06 DIAGNOSIS — D631 Anemia in chronic kidney disease: Secondary | ICD-10-CM | POA: Diagnosis present

## 2022-03-06 DIAGNOSIS — Z5111 Encounter for antineoplastic chemotherapy: Secondary | ICD-10-CM

## 2022-03-06 DIAGNOSIS — Z7961 Long term (current) use of immunomodulator: Secondary | ICD-10-CM | POA: Diagnosis not present

## 2022-03-06 DIAGNOSIS — Z7984 Long term (current) use of oral hypoglycemic drugs: Secondary | ICD-10-CM | POA: Diagnosis not present

## 2022-03-06 DIAGNOSIS — Z794 Long term (current) use of insulin: Secondary | ICD-10-CM | POA: Diagnosis not present

## 2022-03-06 DIAGNOSIS — Z79624 Long term (current) use of inhibitors of nucleotide synthesis: Secondary | ICD-10-CM | POA: Diagnosis not present

## 2022-03-06 DIAGNOSIS — N1832 Chronic kidney disease, stage 3b: Secondary | ICD-10-CM | POA: Diagnosis present

## 2022-03-06 DIAGNOSIS — Z7902 Long term (current) use of antithrombotics/antiplatelets: Secondary | ICD-10-CM | POA: Diagnosis not present

## 2022-03-06 LAB — COMPREHENSIVE METABOLIC PANEL
ALT: 60 U/L — ABNORMAL HIGH (ref 0–44)
AST: 42 U/L — ABNORMAL HIGH (ref 15–41)
Albumin: 2.9 g/dL — ABNORMAL LOW (ref 3.5–5.0)
Alkaline Phosphatase: 89 U/L (ref 38–126)
Anion gap: 5 (ref 5–15)
BUN: 30 mg/dL — ABNORMAL HIGH (ref 8–23)
CO2: 23 mmol/L (ref 22–32)
Calcium: 8.3 mg/dL — ABNORMAL LOW (ref 8.9–10.3)
Chloride: 109 mmol/L (ref 98–111)
Creatinine, Ser: 1.51 mg/dL — ABNORMAL HIGH (ref 0.61–1.24)
GFR, Estimated: 50 mL/min — ABNORMAL LOW (ref >60)
Glucose, Bld: 134 mg/dL — ABNORMAL HIGH (ref 70–99)
Potassium: 5 mmol/L (ref 3.5–5.1)
Sodium: 137 mmol/L (ref 135–145)
Total Bilirubin: 0.4 mg/dL (ref 0.3–1.2)
Total Protein: 7.8 g/dL (ref 6.5–8.1)

## 2022-03-06 LAB — CBC WITH DIFFERENTIAL/PLATELET
Abs Immature Granulocytes: 0.04 10*3/uL (ref 0.00–0.07)
Basophils Absolute: 0 10*3/uL (ref 0.0–0.1)
Basophils Relative: 0 %
Eosinophils Absolute: 0.1 10*3/uL (ref 0.0–0.5)
Eosinophils Relative: 2 %
HCT: 27.3 % — ABNORMAL LOW (ref 39.0–52.0)
Hemoglobin: 8.7 g/dL — ABNORMAL LOW (ref 13.0–17.0)
Immature Granulocytes: 1 %
Lymphocytes Relative: 16 %
Lymphs Abs: 1.1 10*3/uL (ref 0.7–4.0)
MCH: 30.1 pg (ref 26.0–34.0)
MCHC: 31.9 g/dL (ref 30.0–36.0)
MCV: 94.5 fL (ref 80.0–100.0)
Monocytes Absolute: 0.5 10*3/uL (ref 0.1–1.0)
Monocytes Relative: 8 %
Neutro Abs: 5.1 10*3/uL (ref 1.7–7.7)
Neutrophils Relative %: 73 %
Platelets: 502 10*3/uL — ABNORMAL HIGH (ref 150–400)
RBC: 2.89 MIL/uL — ABNORMAL LOW (ref 4.22–5.81)
RDW: 15.6 % — ABNORMAL HIGH (ref 11.5–15.5)
WBC: 7 10*3/uL (ref 4.0–10.5)
nRBC: 0 % (ref 0.0–0.2)

## 2022-03-06 MED ORDER — DEXAMETHASONE 4 MG PO TABS
40.0000 mg | ORAL_TABLET | Freq: Once | ORAL | Status: AC
  Administered 2022-03-06: 40 mg via ORAL
  Filled 2022-03-06: qty 10

## 2022-03-06 MED ORDER — BORTEZOMIB CHEMO SQ INJECTION 3.5 MG (2.5MG/ML)
1.3000 mg/m2 | Freq: Once | INTRAMUSCULAR | Status: AC
  Administered 2022-03-06: 2.5 mg via SUBCUTANEOUS
  Filled 2022-03-06: qty 1

## 2022-03-06 NOTE — Progress Notes (Unsigned)
Pt rolled out of the bed last night but did not get hurt. Still has pain from older fall and uses walker, has PT at home

## 2022-03-06 NOTE — Patient Instructions (Signed)
[facility] [place]  Discharge Instructions: Thank you for choosing [facility] to provide your oncology and hematology care.  If you have a lab appointment with the Cancer Center, please go directly to the Cancer Center and check in at the registration area.  Wear comfortable clothing and clothing appropriate for easy access to any Portacath or PICC line.   We strive to give you quality time with your provider. You may need to reschedule your appointment if you arrive late (15 or more minutes).  Arriving late affects you and other patients whose appointments are after yours.  Also, if you miss three or more appointments without notifying the office, you may be dismissed from the clinic at the provider's discretion.      For prescription refill requests, have your pharmacy contact our office and allow 72 hours for refills to be completed.    Today you received the following chemotherapy and/or immunotherapy agents VELCADE      To help prevent nausea and vomiting after your treatment, we encourage you to take your nausea medication as directed.  BELOW ARE SYMPTOMS THAT SHOULD BE REPORTED IMMEDIATELY: *FEVER GREATER THAN 100.4 F (38 C) OR HIGHER *CHILLS OR SWEATING *NAUSEA AND VOMITING THAT IS NOT CONTROLLED WITH YOUR NAUSEA MEDICATION *UNUSUAL SHORTNESS OF BREATH *UNUSUAL BRUISING OR BLEEDING *URINARY PROBLEMS (pain or burning when urinating, or frequent urination) *BOWEL PROBLEMS (unusual diarrhea, constipation, pain near the anus) TENDERNESS IN MOUTH AND THROAT WITH OR WITHOUT PRESENCE OF ULCERS (sore throat, sores in mouth, or a toothache) UNUSUAL RASH, SWELLING OR PAIN  UNUSUAL VAGINAL DISCHARGE OR ITCHING   Items with * indicate a potential emergency and should be followed up as soon as possible or go to the Emergency Department if any problems should occur.  Please show the CHEMOTHERAPY ALERT CARD or IMMUNOTHERAPY ALERT CARD at check-in to the  Emergency Department and triage nurse.  Should you have questions after your visit or need to cancel or reschedule your appointment, please contact [facility] [place]  [phone] and follow the prompts.  Office hours are 8:00 a.m. to 4:30 p.m. Monday - Friday. Please note that voicemails left after 4:00 p.m. may not be returned until the following business day.  We are closed weekends and major holidays. You have access to a nurse at all times for urgent questions. Please call the main number to the clinic [phone] and follow the prompts.  For any non-urgent questions, you may also contact your provider using MyChart. We now offer e-Visits for anyone 18 and older to request care online for non-urgent symptoms. For details visit [email or web address].   Also download the MyChart app! Go to the app store, search "MyChart", open the app, select [place], and log in with your MyChart username and password.  Masks are optional in the cancer centers. If you would like for your care team to wear a mask while they are taking care of you, please let them know. For doctor visits, patients may have with them one support person who is at least 69 years old. At this time, visitors are not allowed in the infusion area.  Bortezomib Injection What is this medication? BORTEZOMIB (bor TEZ oh mib) treats lymphoma. It may also be used to treat multiple myeloma, a type of bone marrow cancer. It works by blocking a protein that causes cancer cells to grow and multiply. This helps to slow or stop the spread of cancer cells. This medicine may   be used for other purposes; ask your health care provider or pharmacist if you have questions. COMMON BRAND NAME(S): Velcade What should I tell my care team before I take this medication? They need to know if you have any of these conditions: Dehydration Diabetes Heart disease Liver disease Tingling of the fingers or toes or other nerve  disorder An unusual or allergic reaction to bortezomib, other medications, foods, dyes, or preservatives If you or your partner are pregnant or trying to get pregnant Breastfeeding How should I use this medication? This medication is injected into a vein or under the skin. It is given by your care team in a hospital or clinic setting. Talk to your care team about the use of this medication in children. Special care may be needed. Overdosage: If you think you have taken too much of this medicine contact a poison control center or emergency room at once. NOTE: This medicine is only for you. Do not share this medicine with others. What if I miss a dose? Keep appointments for follow-up doses. It is important not to miss your dose. Call your care team if you are unable to keep an appointment. What may interact with this medication? Ketoconazole Rifampin This list may not describe all possible interactions. Give your health care provider a list of all the medicines, herbs, non-prescription drugs, or dietary supplements you use. Also tell them if you smoke, drink alcohol, or use illegal drugs. Some items may interact with your medicine. What should I watch for while using this medication? Your condition will be monitored carefully while you are receiving this medication. You may need blood work while taking this medication. This medication may affect your coordination, reaction time, or judgment. Do not drive or operate machinery until you know how this medication affects you. Sit up or stand slowly to reduce the risk of dizzy or fainting spells. Drinking alcohol with this medication can increase the risk of these side effects. This medication may increase your risk of getting an infection. Call your care team for advice if you get a fever, chills, sore throat, or other symptoms of a cold or flu. Do not treat yourself. Try to avoid being around people who are sick. Check with your care team if you have  severe diarrhea, nausea, and vomiting, or if you sweat a lot. The loss of too much body fluid may make it dangerous for you to take this medication. Talk to your care team if you may be pregnant. Serious birth defects can occur if you take this medication during pregnancy and for 7 months after the last dose. You will need a negative pregnancy test before starting this medication. Contraception is recommended while taking this medication and for 7 months after the last dose. Your care team can help you find the option that works for you. If your partner can get pregnant, use a condom during sex while taking this medication and for 4 months after the last dose. Do not breastfeed while taking this medication and for 2 months after the last dose. This medication may cause infertility. Talk to your care team if you are concerned about your fertility. What side effects may I notice from receiving this medication? Side effects that you should report to your care team as soon as possible: Allergic reactions--skin rash, itching, hives, swelling of the face, lips, tongue, or throat Bleeding--bloody or black, tar-like stools, vomiting blood or brown material that looks like coffee grounds, red or dark brown urine, small   red or purple spots on skin, unusual bruising or bleeding Bleeding in the brain--severe headache, stiff neck, confusion, dizziness, change in vision, numbness or weakness of the face, arm, or leg, trouble speaking, trouble walking, vomiting Bowel blockage--stomach cramping, unable to have a bowel movement or pass gas, loss of appetite, vomiting Heart failure--shortness of breath, swelling of the ankles, feet, or hands, sudden weight gain, unusual weakness or fatigue Infection--fever, chills, cough, sore throat, wounds that don't heal, pain or trouble when passing urine, general feeling of discomfort or being unwell Liver injury--right upper belly pain, loss of appetite, nausea, light-colored  stool, dark yellow or brown urine, yellowing skin or eyes, unusual weakness or fatigue Low blood pressure--dizziness, feeling faint or lightheaded, blurry vision Lung injury--shortness of breath or trouble breathing, cough, spitting up blood, chest pain, fever Pain, tingling, or numbness in the hands or feet Severe or prolonged diarrhea Stomach pain, bloody diarrhea, pale skin, unusual weakness or fatigue, decrease in the amount of urine, which may be signs of hemolytic uremic syndrome Sudden and severe headache, confusion, change in vision, seizures, which may be signs of posterior reversible encephalopathy syndrome (PRES) TTP--purple spots on the skin or inside the mouth, pale skin, yellowing skin or eyes, unusual weakness or fatigue, fever, fast or irregular heartbeat, confusion, change in vision, trouble speaking, trouble walking Tumor lysis syndrome (TLS)--nausea, vomiting, diarrhea, decrease in the amount of urine, dark urine, unusual weakness or fatigue, confusion, muscle pain or cramps, fast or irregular heartbeat, joint pain Side effects that usually do not require medical attention (report to your care team if they continue or are bothersome): Constipation Diarrhea Fatigue Loss of appetite Nausea This list may not describe all possible side effects. Call your doctor for medical advice about side effects. You may report side effects to FDA at 1-800-FDA-1088. Where should I keep my medication? This medication is given in a hospital or clinic. It will not be stored at home. NOTE: This sheet is a summary. It may not cover all possible information. If you have questions about this medicine, talk to your doctor, pharmacist, or health care provider.  2023 Elsevier/Gold Standard (2021-08-13 00:00:00)   

## 2022-03-09 ENCOUNTER — Inpatient Hospital Stay
Admission: EM | Admit: 2022-03-09 | Discharge: 2022-03-12 | DRG: 637 | Disposition: A | Discharge status: Home Health | Payer: Medicare Other | Attending: Internal Medicine | Admitting: Internal Medicine

## 2022-03-09 ENCOUNTER — Other Ambulatory Visit: Payer: Self-pay

## 2022-03-09 DIAGNOSIS — N1831 Chronic kidney disease, stage 3a: Secondary | ICD-10-CM | POA: Diagnosis present

## 2022-03-09 DIAGNOSIS — Z79899 Other long term (current) drug therapy: Secondary | ICD-10-CM

## 2022-03-09 DIAGNOSIS — E162 Hypoglycemia, unspecified: Secondary | ICD-10-CM | POA: Diagnosis present

## 2022-03-09 DIAGNOSIS — R4189 Other symptoms and signs involving cognitive functions and awareness: Secondary | ICD-10-CM | POA: Diagnosis present

## 2022-03-09 DIAGNOSIS — I129 Hypertensive chronic kidney disease with stage 1 through stage 4 chronic kidney disease, or unspecified chronic kidney disease: Secondary | ICD-10-CM | POA: Diagnosis present

## 2022-03-09 DIAGNOSIS — N183 Chronic kidney disease, stage 3 unspecified: Secondary | ICD-10-CM | POA: Diagnosis present

## 2022-03-09 DIAGNOSIS — G9341 Metabolic encephalopathy: Secondary | ICD-10-CM | POA: Diagnosis present

## 2022-03-09 DIAGNOSIS — N170 Acute kidney failure with tubular necrosis: Secondary | ICD-10-CM | POA: Diagnosis present

## 2022-03-09 DIAGNOSIS — D638 Anemia in other chronic diseases classified elsewhere: Secondary | ICD-10-CM | POA: Diagnosis present

## 2022-03-09 DIAGNOSIS — Z9221 Personal history of antineoplastic chemotherapy: Secondary | ICD-10-CM

## 2022-03-09 DIAGNOSIS — E1069 Type 1 diabetes mellitus with other specified complication: Secondary | ICD-10-CM

## 2022-03-09 DIAGNOSIS — C9000 Multiple myeloma not having achieved remission: Secondary | ICD-10-CM | POA: Diagnosis present

## 2022-03-09 DIAGNOSIS — E10649 Type 1 diabetes mellitus with hypoglycemia without coma: Principal | ICD-10-CM | POA: Diagnosis present

## 2022-03-09 DIAGNOSIS — E875 Hyperkalemia: Secondary | ICD-10-CM | POA: Diagnosis present

## 2022-03-09 DIAGNOSIS — F1410 Cocaine abuse, uncomplicated: Secondary | ICD-10-CM | POA: Diagnosis present

## 2022-03-09 DIAGNOSIS — E1040 Type 1 diabetes mellitus with diabetic neuropathy, unspecified: Secondary | ICD-10-CM | POA: Diagnosis present

## 2022-03-09 DIAGNOSIS — D631 Anemia in chronic kidney disease: Secondary | ICD-10-CM | POA: Diagnosis present

## 2022-03-09 DIAGNOSIS — I10 Essential (primary) hypertension: Secondary | ICD-10-CM | POA: Diagnosis present

## 2022-03-09 DIAGNOSIS — K279 Peptic ulcer, site unspecified, unspecified as acute or chronic, without hemorrhage or perforation: Secondary | ICD-10-CM | POA: Diagnosis present

## 2022-03-09 DIAGNOSIS — E1022 Type 1 diabetes mellitus with diabetic chronic kidney disease: Secondary | ICD-10-CM | POA: Diagnosis present

## 2022-03-09 DIAGNOSIS — D472 Monoclonal gammopathy: Secondary | ICD-10-CM | POA: Diagnosis present

## 2022-03-09 DIAGNOSIS — E10621 Type 1 diabetes mellitus with foot ulcer: Secondary | ICD-10-CM | POA: Diagnosis present

## 2022-03-09 DIAGNOSIS — Z7982 Long term (current) use of aspirin: Secondary | ICD-10-CM

## 2022-03-09 DIAGNOSIS — Z7902 Long term (current) use of antithrombotics/antiplatelets: Secondary | ICD-10-CM

## 2022-03-09 DIAGNOSIS — L97529 Non-pressure chronic ulcer of other part of left foot with unspecified severity: Secondary | ICD-10-CM | POA: Diagnosis present

## 2022-03-09 DIAGNOSIS — N179 Acute kidney failure, unspecified: Secondary | ICD-10-CM | POA: Diagnosis present

## 2022-03-09 DIAGNOSIS — E7800 Pure hypercholesterolemia, unspecified: Secondary | ICD-10-CM | POA: Diagnosis present

## 2022-03-09 DIAGNOSIS — Z8673 Personal history of transient ischemic attack (TIA), and cerebral infarction without residual deficits: Secondary | ICD-10-CM

## 2022-03-09 LAB — URINE DRUG SCREEN, QUALITATIVE (ARMC ONLY)
Amphetamines, Ur Screen: NOT DETECTED
Barbiturates, Ur Screen: NOT DETECTED
Benzodiazepine, Ur Scrn: NOT DETECTED
Cannabinoid 50 Ng, Ur ~~LOC~~: NOT DETECTED
Cocaine Metabolite,Ur ~~LOC~~: NOT DETECTED
MDMA (Ecstasy)Ur Screen: NOT DETECTED
Methadone Scn, Ur: NOT DETECTED
Opiate, Ur Screen: NOT DETECTED
Phencyclidine (PCP) Ur S: NOT DETECTED
Tricyclic, Ur Screen: NOT DETECTED

## 2022-03-09 LAB — CBG MONITORING, ED: Glucose-Capillary: 78 mg/dL (ref 70–99)

## 2022-03-09 LAB — KAPPA/LAMBDA LIGHT CHAINS
Kappa free light chain: 19.1 mg/L (ref 3.3–19.4)
Kappa, lambda light chain ratio: 0.28 (ref 0.26–1.65)
Lambda free light chains: 68.1 mg/L — ABNORMAL HIGH (ref 5.7–26.3)

## 2022-03-09 LAB — ETHANOL: Alcohol, Ethyl (B): <10 mg/dL (ref <10)

## 2022-03-09 LAB — BASIC METABOLIC PANEL
Anion gap: 6 (ref 5–15)
BUN: 42 mg/dL — ABNORMAL HIGH (ref 8–23)
CO2: 25 mmol/L (ref 22–32)
Calcium: 8.4 mg/dL — ABNORMAL LOW (ref 8.9–10.3)
Chloride: 106 mmol/L (ref 98–111)
Creatinine, Ser: 1.74 mg/dL — ABNORMAL HIGH (ref 0.61–1.24)
GFR, Estimated: 42 mL/min — ABNORMAL LOW (ref >60)
Glucose, Bld: 82 mg/dL (ref 70–99)
Potassium: 5.8 mmol/L — ABNORMAL HIGH (ref 3.5–5.1)
Sodium: 137 mmol/L (ref 135–145)

## 2022-03-09 LAB — CBC
HCT: 28.5 % — ABNORMAL LOW (ref 39.0–52.0)
Hemoglobin: 8.8 g/dL — ABNORMAL LOW (ref 13.0–17.0)
MCH: 29.8 pg (ref 26.0–34.0)
MCHC: 30.9 g/dL (ref 30.0–36.0)
MCV: 96.6 fL (ref 80.0–100.0)
Platelets: 494 10*3/uL — ABNORMAL HIGH (ref 150–400)
RBC: 2.95 MIL/uL — ABNORMAL LOW (ref 4.22–5.81)
RDW: 16.1 % — ABNORMAL HIGH (ref 11.5–15.5)
WBC: 7.4 10*3/uL (ref 4.0–10.5)
nRBC: 0 % (ref 0.0–0.2)

## 2022-03-09 MED ORDER — SODIUM CHLORIDE 0.9 % IV BOLUS
1000.0000 mL | Freq: Once | INTRAVENOUS | Status: AC
  Administered 2022-03-09: 1000 mL via INTRAVENOUS

## 2022-03-09 MED ORDER — GABAPENTIN 300 MG PO CAPS: 300.0000 mg | ORAL_CAPSULE | ORAL | Status: DC

## 2022-03-09 MED ORDER — ATORVASTATIN CALCIUM 20 MG PO TABS
80.0000 mg | ORAL_TABLET | Freq: Every day | ORAL | Status: DC
  Administered 2022-03-10 – 2022-03-12 (×3): 80 mg via ORAL
  Filled 2022-03-09 (×3): qty 4

## 2022-03-09 MED ORDER — HYDROCODONE-ACETAMINOPHEN 5-325 MG PO TABS: 1.0000 | ORAL_TABLET | ORAL | Status: DC | PRN

## 2022-03-09 MED ORDER — ADULT MULTIVITAMIN W/MINERALS CH
1.0000 | ORAL_TABLET | Freq: Every day | ORAL | Status: DC
  Administered 2022-03-10 – 2022-03-12 (×3): 1 via ORAL
  Filled 2022-03-09 (×3): qty 1

## 2022-03-09 MED ORDER — ACYCLOVIR 400 MG PO TABS
400.0000 mg | ORAL_TABLET | Freq: Every day | ORAL | Status: DC
  Filled 2022-03-09: qty 1

## 2022-03-09 MED ORDER — GABAPENTIN 300 MG PO CAPS
300.0000 mg | ORAL_CAPSULE | Freq: Every morning | ORAL | Status: DC
  Administered 2022-03-10 – 2022-03-12 (×3): 300 mg via ORAL
  Filled 2022-03-09 (×3): qty 1

## 2022-03-09 MED ORDER — SODIUM CHLORIDE 0.9% FLUSH
3.0000 mL | Freq: Two times a day (BID) | INTRAVENOUS | Status: DC
  Administered 2022-03-10 – 2022-03-11 (×3): 3 mL via INTRAVENOUS

## 2022-03-09 MED ORDER — ACETAMINOPHEN 325 MG PO TABS
650.0000 mg | ORAL_TABLET | Freq: Four times a day (QID) | ORAL | Status: DC | PRN
  Administered 2022-03-10 – 2022-03-12 (×5): 650 mg via ORAL
  Filled 2022-03-09 (×5): qty 2

## 2022-03-09 MED ORDER — ACETAMINOPHEN 650 MG RE SUPP: 650.0000 mg | Freq: Four times a day (QID) | RECTAL | Status: DC | PRN

## 2022-03-09 MED ORDER — CLOPIDOGREL BISULFATE 75 MG PO TABS
75.0000 mg | ORAL_TABLET | Freq: Every day | ORAL | Status: DC
  Administered 2022-03-10 – 2022-03-12 (×3): 75 mg via ORAL
  Filled 2022-03-09 (×3): qty 1

## 2022-03-09 MED ORDER — HEPARIN SODIUM (PORCINE) 5000 UNIT/ML IJ SOLN
5000.0000 [IU] | Freq: Three times a day (TID) | INTRAMUSCULAR | Status: DC
  Administered 2022-03-10 – 2022-03-12 (×8): 5000 [IU] via SUBCUTANEOUS
  Filled 2022-03-09 (×8): qty 1

## 2022-03-09 MED ORDER — ASPIRIN 81 MG PO TBEC
81.0000 mg | DELAYED_RELEASE_TABLET | Freq: Every day | ORAL | Status: DC
  Administered 2022-03-10 – 2022-03-12 (×3): 81 mg via ORAL
  Filled 2022-03-09 (×3): qty 1

## 2022-03-09 MED ORDER — NICOTINE 21 MG/24HR TD PT24
21.0000 mg | MEDICATED_PATCH | Freq: Every day | TRANSDERMAL | Status: DC
  Filled 2022-03-09 (×3): qty 1

## 2022-03-09 MED ORDER — GABAPENTIN 300 MG PO CAPS
600.0000 mg | ORAL_CAPSULE | Freq: Every day | ORAL | Status: DC
  Administered 2022-03-10 – 2022-03-11 (×3): 600 mg via ORAL
  Filled 2022-03-09 (×3): qty 2

## 2022-03-09 MED ORDER — DEXTROSE-NACL 5-0.2 % IV SOLN: INTRAVENOUS | Status: DC

## 2022-03-09 MED ORDER — THIAMINE MONONITRATE 100 MG PO TABS
100.0000 mg | ORAL_TABLET | Freq: Every day | ORAL | Status: DC
  Administered 2022-03-10 – 2022-03-12 (×3): 100 mg via ORAL
  Filled 2022-03-09 (×3): qty 1

## 2022-03-09 NOTE — Assessment & Plan Note (Signed)
Hold home medication regimen with Lisabeth Pick. Last A1c of 8.93 months ago with repeat. Carb consistent diet. Accu-Cheks every 2 hours.  In addition to glycemic protocol.

## 2022-03-09 NOTE — Assessment & Plan Note (Signed)
Lab Results  Component Value Date   CREATININE 1.74 (H) 03/09/2022   CREATININE 1.51 (H) 03/06/2022   CREATININE 1.63 (H) 02/25/2022      Latest Ref Rng & Units 03/09/2022    9:55 PM 03/06/2022   10:27 AM 02/25/2022   11:27 AM  CMP  Glucose 70 - 99 mg/dL 82  134  287   BUN 8 - 23 mg/dL 42  30  28   Creatinine 0.61 - 1.24 mg/dL 1.74  1.51  1.63   Sodium 135 - 145 mmol/L 137  137  132   Potassium 3.5 - 5.1 mmol/L 5.8  5.0  4.5   Chloride 98 - 111 mmol/L 106  109  100   CO2 22 - 32 mmol/L '25  23  21   '$ Calcium 8.9 - 10.3 mg/dL 8.4  8.3  8.6   Total Protein 6.5 - 8.1 g/dL  7.8    Total Bilirubin 0.3 - 1.2 mg/dL  0.4    Alkaline Phos 38 - 126 U/L  89    AST 15 - 41 U/L  42    ALT 0 - 44 U/L  60    Renally dose needed medications, avoid contrast therapy.

## 2022-03-09 NOTE — Assessment & Plan Note (Signed)
Accu-Cheks every 2 hourly. Glycemic protocol.

## 2022-03-09 NOTE — Hospital Course (Signed)
Hypoglycemia. EMS used insulin night before.  Pt was passed out in glucose in 40's,.

## 2022-03-09 NOTE — Assessment & Plan Note (Signed)
Vitals:   03/09/22 2154  BP: (!) 169/103  No home blood pressure medication regimen. Will start patient on as needed hydralazine.

## 2022-03-09 NOTE — H&P (Signed)
History and Physical    Chief Complaint: Hypoglycemia   HISTORY OF PRESENT ILLNESS: Nicholas Weiss is an 71 y.o. male brought to the urgency room for Patient found to be hypoglycemic and has had similar episodes in the past previous A1c is above 8 and diabetes is poorly controlled. Will recheck A1c and tender subcu insulin regimen.  Pt has  Past Medical History:  Diagnosis Date  . DKA (diabetic ketoacidoses) 04/06/2016  . Hypercholesteremia   . Hypertension    Review of Systems  Endocrine:       Hypoglycemia  All other systems reviewed and are negative.  Allergies  Allergen Reactions  . Penicillins Other (See Comments)    Childhood allergy Unknown reaction Has patient had a PCN reaction causing immediate rash, facial/tongue/throat swelling, SOB or lightheadedness with hypotension: No Has patient had a PCN reaction causing severe rash involving mucus membranes or skin necrosis: No Has patient had a PCN reaction that required hospitalization No Has patient had a PCN reaction occurring within the last 10 years: No If all of the above answers are "NO", then may proceed with Cephalosporin use.    Past Surgical History:  Procedure Laterality Date  . COLONOSCOPY WITH PROPOFOL N/A 02/01/2020   Procedure: COLONOSCOPY WITH PROPOFOL;  Surgeon: Lin Landsman, MD;  Location: [facility] ENDOSCOPY;  Service: Gastroenterology;  Laterality: N/A;  . ESOPHAGOGASTRODUODENOSCOPY  02/01/2020   Procedure: ESOPHAGOGASTRODUODENOSCOPY (EGD);  Surgeon: Lin Landsman, MD;  Location: [facility] ENDOSCOPY;  Service: Gastroenterology;;  . KNEE SURGERY Right    Torn meniscus  . KNEE SURGERY Left        MEDICATIONS: Current Outpatient Medications  Medication Instructions  . acetaminophen (TYLENOL) 1,000 mg, Oral, Daily PRN  . acyclovir (ZOVIRAX) 400 mg, Oral, Every morning  . aspirin EC 81 mg, Oral, Every morning  . atorvastatin (LIPITOR) 80 mg, Oral, Daily at bedtime  . Baclofen 5 mg, Oral,  Daily PRN  . clopidogrel (PLAVIX) 75 mg, Oral, Every morning  . dicyclomine (BENTYL) 20 mg, Oral, 3 times daily with meals PRN  . Farxiga 10 mg, Oral, Every morning  . gabapentin (NEURONTIN) 300-600 mg, Oral, See admin instructions, 300 mg in the morning, 600 mg at night.  . lenalidomide (REVLIMID) 10 mg, Oral, Daily, Take for 14 days, then hold for 7 days. Repeat every 21 days.  Marland Kitchen NOVOLOG 100 UNIT/ML injection INJECT UP TO 14 UNITS THREE TIMES DAILY BEFORE MEALS ACCORDING TO SLIDING SCALE  . ondansetron (ZOFRAN) 8 mg, Oral, Daily PRN  . prochlorperazine (COMPAZINE) 10 mg, Oral, Daily PRN  . [name] FlexTouch 4 Units, Subcutaneous, Every morning    . acyclovir  400 mg Oral Daily  . aspirin EC  81 mg Oral Daily  . atorvastatin  80 mg Oral Daily  . clopidogrel  75 mg Oral Daily  . gabapentin  300 mg Oral q morning   And  . gabapentin  600 mg Oral QHS  . heparin  5,000 Units Subcutaneous Q8H  . multivitamin with minerals  1 tablet Oral Daily  . nicotine  21 mg Transdermal Daily  . sodium chloride flush  3 mL Intravenous Q12H  . thiamine  100 mg Oral Daily    . dextrose 5 % and 0.2 % NaCl          ED Course: Pt in Ed patient is alert awake oriented afebrile hypertensive. Vitals:   03/09/22 2154  BP: (!) 169/103  Pulse: 78  Resp: 18  Temp: 98.2 F (36.8 C)  TempSrc: Oral  SpO2: 100%    No intake/output data recorded. SpO2: 100 % Blood work in ed shows: hyperkalemia with a potassium of 5.8, AKI with a creatinine of 1.74 on CKD stage IIIa,  anemia that is chronic with a hemoglobin of 8.8 negative urine drug screen. Results for orders placed or performed during the hospital encounter of 03/09/22 (from the past 24 hour(s))  CBC     Status: Abnormal   Collection Time: 03/09/22  9:55 PM  Result Value Ref Range   WBC 7.4 4.0 - 10.5 K/uL   RBC 2.95 (L) 4.22 - 5.81 MIL/uL   Hemoglobin 8.8 (L) 13.0 - 17.0 g/dL   HCT 28.5 (L) 39.0 - 52.0 %   MCV 96.6 80.0 - 100.0 fL   MCH 29.8  26.0 - 34.0 pg   MCHC 30.9 30.0 - 36.0 g/dL   RDW 16.1 (H) 11.5 - 15.5 %   Platelets 494 (H) 150 - 400 K/uL   nRBC 0.0 0.0 - 0.2 %  Basic metabolic panel     Status: Abnormal   Collection Time: 03/09/22  9:55 PM  Result Value Ref Range   Sodium 137 135 - 145 mmol/L   Potassium 5.8 (H) 3.5 - 5.1 mmol/L   Chloride 106 98 - 111 mmol/L   CO2 25 22 - 32 mmol/L   Glucose, Bld 82 70 - 99 mg/dL   BUN 42 (H) 8 - 23 mg/dL   Creatinine, Ser 1.74 (H) 0.61 - 1.24 mg/dL   Calcium 8.4 (L) 8.9 - 10.3 mg/dL   GFR, Estimated 42 (L) >60 mL/min   Anion gap 6 5 - 15  Ethanol     Status: None   Collection Time: 03/09/22  9:55 PM  Result Value Ref Range   Alcohol, Ethyl (B) <10 <10 mg/dL  CBG monitoring, ED     Status: None   Collection Time: 03/09/22  9:56 PM  Result Value Ref Range   Glucose-Capillary 78 70 - 99 mg/dL  Urine Drug Screen, Qualitative ([facility] only)     Status: None   Collection Time: 03/09/22 10:30 PM  Result Value Ref Range   Tricyclic, Ur Screen NONE DETECTED NONE DETECTED   Amphetamines, Ur Screen NONE DETECTED NONE DETECTED   MDMA (Ecstasy)Ur Screen NONE DETECTED NONE DETECTED   Cocaine Metabolite,Ur [place] NONE DETECTED NONE DETECTED   Opiate, Ur Screen NONE DETECTED NONE DETECTED   Phencyclidine (PCP) Ur S NONE DETECTED NONE DETECTED   Cannabinoid 50 Ng, Ur [place] NONE DETECTED NONE DETECTED   Barbiturates, Ur Screen NONE DETECTED NONE DETECTED   Benzodiazepine, Ur Scrn NONE DETECTED NONE DETECTED   Methadone Scn, Ur NONE DETECTED NONE DETECTED    Unresulted Labs (From admission, onward)     Start     Ordered   03/10/22 0500  Comprehensive metabolic panel  Tomorrow morning,   STAT        03/09/22 2351   03/10/22 0500  CBC  Tomorrow morning,   STAT        03/09/22 2351   03/09/22 2353  Lactic acid, plasma  Add-on,   AD        03/09/22 2352   03/09/22 2353  CK  Add-on,   AD        03/09/22 2352   03/09/22 2350  TSH  Once,   URGENT        03/09/22 2351   03/09/22 2350  T4,  free  Once,   URGENT  03/09/22 2351   03/09/22 2350  Hemoglobin A1c  Once,   URGENT        03/09/22 2351   03/09/22 2330  Hepatic function panel  Once,   AD        03/09/22 2330           Pt has received : Orders Placed This Encounter  Procedures  . MR BRAIN WO CONTRAST    Standing Status:   Standing    Number of Occurrences:   1    Order Specific Question:   What is the patient's sedation requirement?    Answer:   No Sedation    Order Specific Question:   Does the patient have a pacemaker or implanted devices?    Answer:   No  . CBC    Standing Status:   Standing    Number of Occurrences:   1  . Basic metabolic panel    Standing Status:   Standing    Number of Occurrences:   1  . Ethanol    Standing Status:   Standing    Number of Occurrences:   1  . Urine Drug Screen, Qualitative ([facility] only)    Standing Status:   Standing    Number of Occurrences:   1  . Hepatic function panel    Standing Status:   Standing    Number of Occurrences:   1  . Comprehensive metabolic panel    Standing Status:   Standing    Number of Occurrences:   1  . CBC    Standing Status:   Standing    Number of Occurrences:   1  . TSH    Standing Status:   Standing    Number of Occurrences:   1  . T4, free    Standing Status:   Standing    Number of Occurrences:   1  . Hemoglobin A1c    Standing Status:   Standing    Number of Occurrences:   1  . Lactic acid, plasma    Standing Status:   Standing    Number of Occurrences:   1  . CK    Standing Status:   Standing    Number of Occurrences:   1  . Diet heart healthy/carb modified Room service appropriate? Yes; Fluid consistency: Thin    Standing Status:   Standing    Number of Occurrences:   1    Order Specific Question:   Diet-HS Snack?    Answer:   Nothing    Order Specific Question:   Room service appropriate?    Answer:   Yes    Order Specific Question:   Fluid consistency:    Answer:   Thin  . Maintain IV access     Standing Status:   Standing    Number of Occurrences:   1  . Vital signs    Standing Status:   Standing    Number of Occurrences:   1  . Notify physician (specify)    Standing Status:   Standing    Number of Occurrences:   20    Order Specific Question:   Notify Physician    Answer:   for pulse less than 55 or greater than 120    Order Specific Question:   Notify Physician    Answer:   for respiratory rate less than 12 or greater than 25    Order Specific Question:   Notify Physician  Answer:   for temperature greater than 100.5 F    Order Specific Question:   Notify Physician    Answer:   for urinary output less than 30 mL/hr for four hours    Order Specific Question:   Notify Physician    Answer:   for systolic BP less than 90 or greater than 681, diastolic BP less than 60 or greater than 100    Order Specific Question:   Notify Physician    Answer:   for new hypoxia w/ oxygen saturations < 88%  . Progressive Mobility Protocol: No Restrictions    Standing Status:   Standing    Number of Occurrences:   1  . Daily weights    Standing Status:   Standing    Number of Occurrences:   1  . Intake and Output    Standing Status:   Standing    Number of Occurrences:   1  . Initiate Oral Care Protocol    Standing Status:   Standing    Number of Occurrences:   1  . Initiate Carrier Fluid Protocol    Standing Status:   Standing    Number of Occurrences:   1  . RN may order General Admission PRN Orders utilizing "General Admission PRN medications" (through manage orders) for the following patient needs: allergy symptoms (Claritin), cold sores (Carmex), cough (Robitussin DM), eye irritation (Liquifilm Tears), hemorrhoids (Tucks), indigestion (Maalox), minor skin irritation (Hydrocortisone Cream), muscle pain Suezanne Jacquet Gay), nose irritation (saline nasal spray) and sore throat (Chloraseptic spray).    Standing Status:   Standing    Number of Occurrences:   [id number]  . Cardiac Monitoring Continuous  x 48 hours Indications for use: Other, Syncope of unknown etiology; Other indications for use: hypoglycemia    Standing Status:   Standing    Number of Occurrences:   1    Order Specific Question:   Indications for use:    Answer:   Other    Order Specific Question:   Indications for use:    Answer:   Syncope of unknown etiology    Order Specific Question:   Other indications for use:    Answer:   hypoglycemia  . Full code    Standing Status:   Standing    Number of Occurrences:   1  . Consult to hospitalist    Standing Status:   Standing    Number of Occurrences:   1    Order Specific Question:   Place call to:    Answer:   [id number]    Order Specific Question:   Reason for Consult    Answer:   Admit    Order Specific Question:   Diagnosis/Clinical Info for Consult:    Answer:   hypoglycemia  . Pulse oximetry check with vital signs    Standing Status:   Standing    Number of Occurrences:   1  . Oxygen therapy Mode or (Route): Nasal cannula; Liters Per Minute: 2; Keep 02 saturation: greater than 92 %    Standing Status:   Standing    Number of Occurrences:   20    Order Specific Question:   Mode or (Route)    Answer:   Nasal cannula    Order Specific Question:   Liters Per Minute    Answer:   2    Order Specific Question:   Keep 02 saturation    Answer:   greater than 92 %  . CBG  monitoring, ED    Standing Status:   Standing    Number of Occurrences:   1  . ED EKG    Standing Status:   Standing    Number of Occurrences:   1    Order Specific Question:   Reason for Exam    Answer:   Chest Pain  . Place in observation (patient's expected length of stay will be less than 2 midnights)    Standing Status:   Standing    Number of Occurrences:   1    Order Specific Question:   Hospital Area    Answer:   [facility] [100120]    Order Specific Question:   Level of Care    Answer:   Telemetry Medical [104]    Order Specific Question:   Covid Evaluation     Answer:   Asymptomatic - no recent exposure (last 10 days) testing not required    Order Specific Question:   Diagnosis    Answer:   Hypoglycemia [419379]    Order Specific Question:   Admitting Physician    Answer:   Cherylann Ratel    Order Specific Question:   Attending Physician    Answer:   Cherylann Ratel  . Aspiration precautions    Standing Status:   Standing    Number of Occurrences:   1  . Fall precautions    Standing Status:   Standing    Number of Occurrences:   1  . Seizure precautions    Standing Status:   Standing    Number of Occurrences:   1    Meds ordered this encounter  Medications  . sodium chloride 0.9 % bolus 1,000 mL  . acyclovir (ZOVIRAX) tablet 400 mg  . clopidogrel (PLAVIX) tablet 75 mg  . aspirin EC tablet 81 mg  . atorvastatin (LIPITOR) tablet 80 mg  . DISCONTD: gabapentin (NEURONTIN) capsule 300-600 mg    300 mg in the morning, 600 mg at night.    . heparin injection 5,000 Units  . sodium chloride flush (NS) 0.9 % injection 3 mL  . OR Linked Order Group   . acetaminophen (TYLENOL) tablet 650 mg   . acetaminophen (TYLENOL) suppository 650 mg  . HYDROcodone-acetaminophen (NORCO/VICODIN) 5-325 MG per tablet 1 tablet  . dextrose 5 % and 0.2 % NaCl infusion  . nicotine (NICODERM CQ - dosed in mg/24 hours) patch 21 mg  . multivitamin with minerals tablet 1 tablet  . thiamine (VITAMIN B1) tablet 100 mg  . AND Linked Order Group   . gabapentin (NEURONTIN) capsule 300 mg   . gabapentin (NEURONTIN) capsule 600 mg     Admission Imaging : No results found.    Physical Examination: Vitals:   03/09/22 2154  BP: (!) 169/103  Pulse: 78  Temp: 98.2 F (36.8 C)  Resp: 18  SpO2: 100%  TempSrc: Oral   Physical Exam Vitals and nursing note reviewed.  Constitutional:      General: He is not in acute distress.    Appearance: Normal appearance. He is not ill-appearing, toxic-appearing or diaphoretic.  HENT:     Head: Normocephalic and  atraumatic.     Right Ear: Hearing and external ear normal.     Left Ear: Hearing and external ear normal.     Nose: Nose normal. No nasal deformity.     Mouth/Throat:     Lips: Pink.     Mouth: Mucous membranes are moist.  Tongue: No lesions.     Pharynx: Oropharynx is clear.  Eyes:     Extraocular Movements: Extraocular movements intact.  Cardiovascular:     Rate and Rhythm: Normal rate and regular rhythm.     Pulses: Normal pulses.     Heart sounds: Normal heart sounds.  Pulmonary:     Effort: Pulmonary effort is normal.     Breath sounds: Normal breath sounds.  Abdominal:     General: Bowel sounds are normal. There is no distension.     Palpations: Abdomen is soft. There is no mass.     Tenderness: There is no abdominal tenderness. There is no guarding.     Hernia: No hernia is present.  Musculoskeletal:     Right lower leg: No edema.     Left lower leg: No edema.  Skin:    General: Skin is warm.  Neurological:     General: No focal deficit present.     Mental Status: He is alert and oriented to person, place, and time.     Cranial Nerves: Cranial nerves 2-12 are intact.     Motor: Motor function is intact.  Psychiatric:        Attention and Perception: Attention normal.        Mood and Affect: Mood normal.        Speech: Speech normal.        Behavior: Behavior normal. Behavior is cooperative.        Cognition and Memory: Cognition normal.      Assessment and Plan: * Hypoglycemia Accu-Cheks every 2 hourly. Glycemic protocol.  Acute renal failure with acute tubular necrosis superimposed on stage 3a chronic kidney disease (HCC) Lab Results  Component Value Date   CREATININE 1.74 (H) 03/09/2022   CREATININE 1.51 (H) 03/06/2022   CREATININE 1.63 (H) 02/25/2022      Latest Ref Rng & Units 03/09/2022    9:55 PM 03/06/2022   10:27 AM 02/25/2022   11:27 AM  CMP  Glucose 70 - 99 mg/dL 82  134  287   BUN 8 - 23 mg/dL 42  30  28   Creatinine 0.61 - 1.24 mg/dL  1.74  1.51  1.63   Sodium 135 - 145 mmol/L 137  137  132   Potassium 3.5 - 5.1 mmol/L 5.8  5.0  4.5   Chloride 98 - 111 mmol/L 106  109  100   CO2 22 - 32 mmol/L '25  23  21   '$ Calcium 8.9 - 10.3 mg/dL 8.4  8.3  8.6   Total Protein 6.5 - 8.1 g/dL  7.8    Total Bilirubin 0.3 - 1.2 mg/dL  0.4    Alkaline Phos 38 - 126 U/L  89    AST 15 - 41 U/L  42    ALT 0 - 44 U/L  60    Renally dose needed medications, avoid contrast therapy.    Type 1 diabetes mellitus with diabetic neuropathy, unspecified ([place]) Hold home medication regimen with Lisabeth Pick. Last A1c of 8.93 months ago with repeat. Carb consistent diet. Accu-Cheks every 2 hours.  In addition to glycemic protocol.  Essential (primary) hypertension Vitals:   03/09/22 2154  BP: (!) 169/103  No home blood pressure medication regimen. Will start patient on as needed hydralazine.   PUD (peptic ulcer disease) IV PPI therapy with Protonix twice daily.   Unresponsiveness 2/2 hypoglycemia. Recovered with dextrose. MRI brain to rule out cva.    DVT prophylaxis:  Heparin  Code Status:  Full Code   Family Communication:  Bruce,[name] (Friend)  [phone]   Disposition Plan:  HOME   Consults called:  None  Admission status: Observation   Unit/ Expected LOS: Telemetry / 2 days.    Para Skeans MD [facility]  6 PM- 2 AM. Please contact me via secure Chat 6 PM-2 AM. [phone]( Pager ) To contact the [facility] Attending or Consulting provider [place] or covering provider during after hours [place], for this patient.   Check the care team in [facility] and look for a) attending/consulting [facility] provider listed and b) the [facility] team listed Log into [email or web address] and use [place]'s universal password to access. If you do not have the password, please contact the hospital operator. Locate the [facility] provider you are looking for under [facility] and page to a number that you can be directly reached. If you still have  difficulty reaching the provider, please page the [facility] (Director on Call) for the Hospitalists listed on amion for assistance. [email or web address] 03/10/2022, 1:57 AM

## 2022-03-09 NOTE — ED Provider Notes (Signed)
[facility] Provider Note    Event Date/Time   First MD Initiated Contact with Patient 03/09/22 2144     (approximate)   History   Hypoglycemia   HPI  Nicholas Weiss is a 71 y.o. male past medical history significant for cocaine use, diabetes, who presents to the emergency department following hypoglycemia.  EMS was called out earlier this morning for an episode of unresponsiveness.  When they arrived patient's glucose was in the 30s, he received D10 and when he woke up he "woke up swinging".  Refused to be transported at that time.  They were then called out later on this afternoon for unresponsiveness episode.  When they arrived his glucose was in the 40s and he had a half eaten sandwich next to him.  Patient states he took his insulin last night.     Physical Exam      Most recent vital signs: Vitals:   03/09/22 2154  BP: (!) 169/103  Pulse: 78  Resp: 18  Temp: 98.2 F (36.8 C)  SpO2: 100%    Physical Exam Constitutional:      Appearance: He is well-developed.  HENT:     Head: Atraumatic.  Eyes:     Conjunctiva/sclera: Conjunctivae normal.  Cardiovascular:     Rate and Rhythm: Regular rhythm.  Pulmonary:     Effort: No respiratory distress.  Musculoskeletal:     Cervical back: Normal range of motion.  Skin:    General: Skin is warm.  Neurological:     Mental Status: He is alert. Mental status is at baseline.     IMPRESSION / MDM / ASSESSMENT AND PLAN / ED COURSE  I reviewed the triage vital signs and the nursing notes.  On arrival to the emergency department patient was given food.  Ordered a sandwich and given orange juice.  On chart review outside records patient has a history of cocaine use and a history of hyper and hypoglycemia.  Glucose 78.  No significant anemia when compared to his baseline.   Labs (all labs ordered are listed, but only abnormal results are displayed) Labs interpreted as -   On reevaluation  significant improvement after tolerating p.o. Labs Reviewed  CBC - Abnormal; Notable for the following components:      Result Value   RBC 2.95 (*)    Hemoglobin 8.8 (*)    HCT 28.5 (*)    RDW 16.1 (*)    Platelets 494 (*)    All other components within normal limits  BASIC METABOLIC PANEL - Abnormal; Notable for the following components:   Potassium 5.8 (*)    BUN 42 (*)    Creatinine, Ser 1.74 (*)    Calcium 8.4 (*)    GFR, Estimated 42 (*)    All other components within normal limits  ETHANOL  URINE DRUG SCREEN, QUALITATIVE ([facility] ONLY)  HEPATIC FUNCTION PANEL  COMPREHENSIVE METABOLIC PANEL  CBC  TSH  T4, FREE  HEMOGLOBIN A1C  LACTIC ACID, PLASMA  CK  CBG MONITORING, ED      Consulted hospitalist for further evaluation and admission for recurrent hypoglycemia   PROCEDURES:  Critical Care performed: No  Procedures  Patient's presentation is most consistent with acute presentation with potential threat to life or bodily function.   MEDICATIONS ORDERED IN ED: Medications  acyclovir (ZOVIRAX) tablet 400 mg (has no administration in time range)  clopidogrel (PLAVIX) tablet 75 mg (has no administration in time range)  aspirin EC tablet  81 mg (has no administration in time range)  atorvastatin (LIPITOR) tablet 80 mg (has no administration in time range)  heparin injection 5,000 Units (has no administration in time range)  sodium chloride flush (NS) 0.9 % injection 3 mL (has no administration in time range)  acetaminophen (TYLENOL) tablet 650 mg (has no administration in time range)    Or  acetaminophen (TYLENOL) suppository 650 mg (has no administration in time range)  HYDROcodone-acetaminophen (NORCO/VICODIN) 5-325 MG per tablet 1 tablet (has no administration in time range)  dextrose 5 % and 0.2 % NaCl infusion (has no administration in time range)  nicotine (NICODERM CQ - dosed in mg/24 hours) patch 21 mg (has no administration in time range)  multivitamin with  minerals tablet 1 tablet (has no administration in time range)  thiamine (VITAMIN B1) tablet 100 mg (has no administration in time range)  gabapentin (NEURONTIN) capsule 300 mg (has no administration in time range)    And  gabapentin (NEURONTIN) capsule 600 mg (has no administration in time range)  sodium chloride 0.9 % bolus 1,000 mL (1,000 mLs Intravenous New Bag/Given 03/09/22 2326)    FINAL CLINICAL IMPRESSION(S) / ED DIAGNOSES   Final diagnoses:  Hypoglycemia     Rx / DC Orders   ED Discharge Orders     None        Note:  This document was prepared using Dragon voice recognition software and may include unintentional dictation errors.   Nathaniel Man, MD 03/10/22 [phone]

## 2022-03-09 NOTE — ED Triage Notes (Signed)
Pt presents to the ED via EMS from home due hypoglycemia multiple times today. Pt states he usually has a high blood sugar but "it keeps going low". Pt is A&Ox4 upon arrival.

## 2022-03-09 NOTE — ED Notes (Signed)
This RN gave pt food

## 2022-03-09 NOTE — Assessment & Plan Note (Signed)
IV PPI therapy with Protonix twice daily.

## 2022-03-10 ENCOUNTER — Observation Stay: Payer: Medicare Other

## 2022-03-10 ENCOUNTER — Encounter: Payer: Self-pay | Admitting: Internal Medicine

## 2022-03-10 ENCOUNTER — Other Ambulatory Visit: Payer: Self-pay

## 2022-03-10 DIAGNOSIS — E162 Hypoglycemia, unspecified: Secondary | ICD-10-CM | POA: Diagnosis not present

## 2022-03-10 LAB — CBC
HCT: 28.8 % — ABNORMAL LOW (ref 39.0–52.0)
Hemoglobin: 8.9 g/dL — ABNORMAL LOW (ref 13.0–17.0)
MCH: 30 pg (ref 26.0–34.0)
MCHC: 30.9 g/dL (ref 30.0–36.0)
MCV: 97 fL (ref 80.0–100.0)
Platelets: 499 10*3/uL — ABNORMAL HIGH (ref 150–400)
RBC: 2.97 MIL/uL — ABNORMAL LOW (ref 4.22–5.81)
RDW: 16 % — ABNORMAL HIGH (ref 11.5–15.5)
WBC: 6.8 10*3/uL (ref 4.0–10.5)
nRBC: 0 % (ref 0.0–0.2)

## 2022-03-10 LAB — COMPREHENSIVE METABOLIC PANEL
ALT: 39 U/L (ref 0–44)
AST: 22 U/L (ref 15–41)
Albumin: 2.7 g/dL — ABNORMAL LOW (ref 3.5–5.0)
Alkaline Phosphatase: 83 U/L (ref 38–126)
Anion gap: 5 (ref 5–15)
BUN: 39 mg/dL — ABNORMAL HIGH (ref 8–23)
CO2: 25 mmol/L (ref 22–32)
Calcium: 7.9 mg/dL — ABNORMAL LOW (ref 8.9–10.3)
Chloride: 104 mmol/L (ref 98–111)
Creatinine, Ser: 1.6 mg/dL — ABNORMAL HIGH (ref 0.61–1.24)
GFR, Estimated: 47 mL/min — ABNORMAL LOW (ref >60)
Glucose, Bld: 235 mg/dL — ABNORMAL HIGH (ref 70–99)
Potassium: 5.9 mmol/L — ABNORMAL HIGH (ref 3.5–5.1)
Sodium: 134 mmol/L — ABNORMAL LOW (ref 135–145)
Total Bilirubin: 0.4 mg/dL (ref 0.3–1.2)
Total Protein: 7.2 g/dL (ref 6.5–8.1)

## 2022-03-10 LAB — HEPATIC FUNCTION PANEL
ALT: 44 U/L (ref 0–44)
AST: 25 U/L (ref 15–41)
Albumin: 3 g/dL — ABNORMAL LOW (ref 3.5–5.0)
Alkaline Phosphatase: 90 U/L (ref 38–126)
Bilirubin, Direct: <0.1 mg/dL (ref 0.0–0.2)
Total Bilirubin: 0.4 mg/dL (ref 0.3–1.2)
Total Protein: 7.7 g/dL (ref 6.5–8.1)

## 2022-03-10 LAB — BASIC METABOLIC PANEL
Anion gap: 3 — ABNORMAL LOW (ref 5–15)
Anion gap: 5 (ref 5–15)
BUN: 34 mg/dL — ABNORMAL HIGH (ref 8–23)
BUN: 37 mg/dL — ABNORMAL HIGH (ref 8–23)
CO2: 25 mmol/L (ref 22–32)
CO2: 26 mmol/L (ref 22–32)
Calcium: 8.2 mg/dL — ABNORMAL LOW (ref 8.9–10.3)
Calcium: 8 mg/dL — ABNORMAL LOW (ref 8.9–10.3)
Chloride: 107 mmol/L (ref 98–111)
Chloride: 104 mmol/L (ref 98–111)
Creatinine, Ser: 1.37 mg/dL — ABNORMAL HIGH (ref 0.61–1.24)
Creatinine, Ser: 1.55 mg/dL — ABNORMAL HIGH (ref 0.61–1.24)
GFR, Estimated: 56 mL/min — ABNORMAL LOW (ref >60)
GFR, Estimated: 48 mL/min — ABNORMAL LOW (ref >60)
Glucose, Bld: 117 mg/dL — ABNORMAL HIGH (ref 70–99)
Glucose, Bld: 257 mg/dL — ABNORMAL HIGH (ref 70–99)
Potassium: 5.4 mmol/L — ABNORMAL HIGH (ref 3.5–5.1)
Potassium: 5.4 mmol/L — ABNORMAL HIGH (ref 3.5–5.1)
Sodium: 135 mmol/L (ref 135–145)
Sodium: 135 mmol/L (ref 135–145)

## 2022-03-10 LAB — CBG MONITORING, ED: Glucose-Capillary: 164 mg/dL — ABNORMAL HIGH (ref 70–99)

## 2022-03-10 LAB — HEMOGLOBIN A1C
Hgb A1c MFr Bld: 9.1 % — ABNORMAL HIGH (ref 4.8–5.6)
Mean Plasma Glucose: 214 mg/dL

## 2022-03-10 LAB — GLUCOSE, CAPILLARY
Glucose-Capillary: 109 mg/dL — ABNORMAL HIGH (ref 70–99)
Glucose-Capillary: 317 mg/dL — ABNORMAL HIGH (ref 70–99)
Glucose-Capillary: 181 mg/dL — ABNORMAL HIGH (ref 70–99)

## 2022-03-10 LAB — CK: Total CK: 47 U/L — ABNORMAL LOW (ref 49–397)

## 2022-03-10 LAB — T4, FREE: Free T4: 0.57 ng/dL — ABNORMAL LOW (ref 0.61–1.12)

## 2022-03-10 LAB — LACTIC ACID, PLASMA: Lactic Acid, Venous: 3 mmol/L (ref 0.5–1.9)

## 2022-03-10 LAB — TSH: TSH: 1.757 u[IU]/mL (ref 0.350–4.500)

## 2022-03-10 MED ORDER — SODIUM CHLORIDE 0.9 % IV SOLN: INTRAVENOUS | Status: DC

## 2022-03-10 MED ORDER — ALBUTEROL SULFATE (2.5 MG/3ML) 0.083% IN NEBU
10.0000 mg | INHALATION_SOLUTION | Freq: Once | RESPIRATORY_TRACT | Status: DC
  Filled 2022-03-10: qty 12

## 2022-03-10 MED ORDER — ACYCLOVIR 200 MG PO CAPS
400.0000 mg | ORAL_CAPSULE | Freq: Every day | ORAL | Status: DC
  Administered 2022-03-10 – 2022-03-12 (×3): 400 mg via ORAL
  Filled 2022-03-10 (×4): qty 2

## 2022-03-10 MED ORDER — INSULIN ASPART 100 UNIT/ML IJ SOLN
0.0000 [IU] | Freq: Every day | INTRAMUSCULAR | Status: DC
  Filled 2022-03-10: qty 1

## 2022-03-10 MED ORDER — CALCIUM GLUCONATE-NACL 1-0.675 GM/50ML-% IV SOLN
1.0000 g | Freq: Once | INTRAVENOUS | Status: AC
  Administered 2022-03-10: 1000 mg via INTRAVENOUS
  Filled 2022-03-10: qty 50

## 2022-03-10 MED ORDER — INSULIN ASPART 100 UNIT/ML IJ SOLN
0.0000 [IU] | Freq: Three times a day (TID) | INTRAMUSCULAR | Status: DC
  Administered 2022-03-10: 7 [IU] via SUBCUTANEOUS
  Filled 2022-03-10: qty 1

## 2022-03-10 MED ORDER — PATIROMER SORBITEX CALCIUM 8.4 G PO PACK
8.4000 g | PACK | Freq: Every day | ORAL | Status: AC
  Administered 2022-03-10: 8.4 g via ORAL
  Filled 2022-03-10: qty 1

## 2022-03-10 NOTE — Inpatient Diabetes Management (Signed)
Inpatient Diabetes Program Recommendations  AACE/ADA: New Consensus Statement on Inpatient Glycemic Control (2015)  Target Ranges:  Prepandial:   less than 140 mg/dL      Peak postprandial:   less than 180 mg/dL (1-2 hours)      Critically ill patients:  140 - 180 mg/dL   Lab Results  Component Value Date   GLUCAP 164 (H) 03/10/2022   HGBA1C 8.9 (H) 11/27/2021    Review of Glycemic Control  Latest Reference Range & Units 03/09/22 21:56 03/10/22 08:48 03/10/22 11:38  Glucose-Capillary 70 - 99 mg/dL 78 164 (H) 109 (H)  (H): Data is abnormally high (H): Data is abnormally high  Diabetes history: DM1(does not make insulin.  Needs correction, basal and meal coverage)  Outpatient Diabetes medications:  Tresiba 20 units QAM Novolog 0-14 units TID Farxiga 10 mg QD Current orders for Inpatient glycemic control:  Novolog 0-9 units TID ENDO-Dr. Gabriel Carina  Spoke with patient at bedside.  He states he takes his [name] every morning and checks his glucose at least 3 times a day.  He has a CGM at home but does not wear it because he often knocks it off in a doorway.  He states he has not set up alerts on the CGM.  He has more sensors at home and when he uses the CGM the readings go to his phone.  Asked him to have someone bring in his CGM and phone and I can help him set the alarms.  Encouraged him to wear the sensor to help alert him when he is headed towards hypoglycemia.   He states he has been running low the last 2 days.  He states he last took his insulin on 12/10.    He will need some basal insulin and meal coverage as he does not make insulin.    Might consider:  Semglee 10 units QD (66.9 kg X 0.15 units) Novolog 0-6 units TID Novolog 2 units TID with meals if consumes at least 50%  Will continue to follow while inpatient.  Thank you, Reche Dixon, MSN, [place] Diabetes Coordinator Inpatient Diabetes Program [phone] (team pager from 8a-5p)

## 2022-03-10 NOTE — ED Notes (Signed)
Report given to [place], RN

## 2022-03-10 NOTE — Assessment & Plan Note (Signed)
2/2 hypoglycemia. Recovered with dextrose. MRI brain to rule out cva.

## 2022-03-10 NOTE — ED Notes (Signed)
Pt up to use restroom.  Ambulatory with steady gait.

## 2022-03-10 NOTE — ED Notes (Signed)
Introduced self to pt, patient resting with covers on, refusing vital signs at this time.  Will continue to round on patient and attempt vitals again.

## 2022-03-10 NOTE — Progress Notes (Addendum)
PROGRESS NOTE    Nicholas Weiss  FOY:[id number] DOB: 1950/11/29 DOA: 03/09/2022 PCP: Dion Body, MD  Outpatient Specialists: oncology, endocrinology    Brief Narrative:   Presented via ems for symptomatic hypoglycemia, per patient was in the 80s at home   Assessment & Plan:   Principal Problem:   Hypoglycemia Active Problems:   Type 1 diabetes mellitus with diabetic neuropathy, unspecified ([place])   Essential (primary) hypertension   Acute renal failure with acute tubular necrosis superimposed on stage 3a chronic kidney disease ([place])   Unresponsiveness   PUD (peptic ulcer disease)   Anemia of chronic kidney failure, stage 3 (moderate) ([place])   Smoldering multiple myeloma   Cocaine abuse ([place])   Multiple myeloma ([place])   History of CVA (cerebrovascular accident)  # Hypoglycemia, symptomatic In the 30s at home, wnl here. Symptomatic, resolved - SSI sensitive - DM educator consult - hold home long acting for the time being  # Metabolic encephalopathy 2/2 hypoglycemia, resolved. MRI nothing acute - monitor  # Hyperkalemia K 5.9 on admission, 5.8 this morning, does not appear this has yet been addressed - continue fluids - will treat with high-dose albuterol and veltassa. Avoiding insulin given above presentation - stat EKG shows prolonged PR, will maintain on tele, give calcium - trend potassium  # Recent traumatic pneumothorax with left ribs 8 and 9 fractures and L1 compression fracture Symptomatically improving. Breathing comfortably on room air satting 100% - monitor  # Ulcers, distal toes Largest is left first toe. NO signs infection. Pulses not palpable - will check lower extremity ABI  # Hx CVA - cont asa, plavix, statin  # CKD 3a Cr 1.6 is at his baseline - monitor  # Multiple myeloma Followed by onc, currently treating with velcade and reflimid - will inform Dr. Janese Banks of patient's presence  # Anemia of ckd Hgb 8.9 stable from  [employer]     DVT prophylaxis: heparin Code Status: full Family Communication: none @ bedside  Level of care: Telemetry Medical Status is: Observation    Consultants:  none  Procedures: none  Antimicrobials:  none    Subjective: Says feeling fine  Objective: Vitals:   03/10/22 0437 03/10/22 0500 03/10/22 0530 03/10/22 0600  BP: 111/86 (!) 137/94  124/78  Pulse: 85 87 76 74  Resp: _0 Temp:      TempSrc:      SpO2: 93% 100% 99% 100%   No intake or output data in the 24 hours ending 03/10/22 0831 There were no vitals filed for this visit.  Examination:  General exam: Appears calm and comfortable  Respiratory system: Clear to auscultation. Respiratory effort normal. Cardiovascular system: S1 & S2 heard, RRR. No JVD, murmurs, rubs, gallops or clicks. No pedal edema. Gastrointestinal system: Abdomen is nondistended, soft and nontender. No organomegaly or masses felt. Normal bowel sounds heard. Central nervous system: Alert and oriented. No focal neurological deficits. Extremities: Symmetric 5 x 5 power. Skin: No rashes, lesions or ulcers Psychiatry: Judgement and insight appear normal. Mood & affect appropriate.     Data Reviewed: I have personally reviewed following labs and imaging studies  CBC: Recent Labs  Lab 03/06/22 1027 03/09/22 2155 03/10/22 0355  WBC 7.0 7.4 6.8  NEUTROABS 5.1  --   --   HGB 8.7* 8.8* 8.9*  HCT 27.3* 28.5* 28.8*  MCV 94.5 96.6 97.0  PLT 502* 494* 767*   Basic Metabolic Panel: Recent Labs  Lab 03/06/22 1027 03/09/22 2155 03/10/22  0355  NA 137 137 134*  K 5.0 5.8* 5.9*  CL 109 106 104  CO2 _0 GLUCOSE 134* 82 235*  BUN 30* 42* 39*  CREATININE 1.51* 1.74* 1.60*  CALCIUM 8.3* 8.4* 7.9*   GFR: Estimated Creatinine Clearance: 41.8 mL/min (A) (by C-G formula based on SCr of 1.6 mg/dL (H)). Liver Function Tests: Recent Labs  Lab 03/06/22 1027 03/09/22 2155 03/10/22 0355  AST 42* 25 22  ALT 60* 44 39   ALKPHOS 89 90 83  BILITOT 0.4 0.4 0.4  PROT 7.8 7.7 7.2  ALBUMIN 2.9* 3.0* 2.7*   No results for input(s): "LIPASE", "AMYLASE" in the last 168 hours. No results for input(s): "AMMONIA" in the last 168 hours. Coagulation Profile: No results for input(s): "INR", "PROTIME" in the last 168 hours. Cardiac Enzymes: Recent Labs  Lab 03/09/22 2155  CKTOTAL 47*   BNP (last 3 results) No results for input(s): "PROBNP" in the last 8760 hours. HbA1C: No results for input(s): "HGBA1C" in the last 72 hours. CBG: Recent Labs  Lab 03/09/22 2156  GLUCAP 78   Lipid Profile: No results for input(s): "CHOL", "HDL", "LDLCALC", "TRIG", "CHOLHDL", "LDLDIRECT" in the last 72 hours. Thyroid Function Tests: Recent Labs    03/09/22 2155  TSH 1.757  FREET4 0.57*   Anemia Panel: No results for input(s): "VITAMINB12", "FOLATE", "FERRITIN", "TIBC", "IRON", "RETICCTPCT" in the last 72 hours. Urine analysis:    Component Value Date/Time   COLORURINE YELLOW (A) 02/17/2022 2240   APPEARANCEUR CLOUDY (A) 02/17/2022 2240   LABSPEC 1.015 02/17/2022 2240   PHURINE 7.0 02/17/2022 2240   GLUCOSEU >=500 (A) 02/17/2022 2240   HGBUR MODERATE (A) 02/17/2022 2240   BILIRUBINUR NEGATIVE 02/17/2022 2240   KETONESUR NEGATIVE 02/17/2022 2240   PROTEINUR 100 (A) 02/17/2022 2240   NITRITE NEGATIVE 02/17/2022 2240   LEUKOCYTESUR SMALL (A) 02/17/2022 2240   Sepsis Labs: _1 (procalcitonin:4,lacticidven:4)  )No results found for this or any previous visit (from the past 240 hour(s)).       Radiology Studies: MR BRAIN WO CONTRAST  Result Date: 03/10/2022 CLINICAL DATA:  Encephalopathy EXAM: MRI HEAD WITHOUT CONTRAST TECHNIQUE: Multiplanar, multiecho pulse sequences of the brain and surrounding structures were obtained without intravenous contrast. COMPARISON:  None Available. FINDINGS: Brain: No acute infarct, mass effect or extra-axial collection. Approximately 5 bilateral periventricular chronic  microhemorrhages. No acute hemorrhage. There is multifocal hyperintense T2-weighted signal within the white matter. Parenchymal volume and CSF spaces are normal. The midline structures are normal. Vascular: Major flow voids are preserved. Skull and upper cervical spine: Normal calvarium and skull base. Visualized upper cervical spine and soft tissues are normal. Sinuses/Orbits:No paranasal sinus fluid levels or advanced mucosal thickening. No mastoid or middle ear effusion. Normal orbits. IMPRESSION: 1. No acute intracranial abnormality. 2. Findings of chronic small vessel ischemia. Electronically Signed   By: Ulyses Jarred M.D.   On: 03/10/2022 01:38        Scheduled Meds:  acyclovir  400 mg Oral Daily   albuterol  10 mg Nebulization Once   aspirin EC  81 mg Oral Daily   atorvastatin  80 mg Oral Daily   clopidogrel  75 mg Oral Daily   gabapentin  300 mg Oral q morning   And   gabapentin  600 mg Oral QHS   heparin  5,000 Units Subcutaneous Q8H   insulin aspart  0-5 Units Subcutaneous QHS   insulin aspart  0-9 Units Subcutaneous TID WC   multivitamin with minerals  1 tablet Oral Daily   nicotine  21 mg Transdermal Daily   sodium chloride flush  3 mL Intravenous Q12H   thiamine  100 mg Oral Daily   Continuous Infusions:  sodium chloride       LOS: 0 days     Desma Maxim, MD [facility]   If 7PM-7AM, please contact night-coverage [email or web address] Password [facility] 03/10/2022, 8:31 AM

## 2022-03-11 ENCOUNTER — Inpatient Hospital Stay: Payer: Medicare Other

## 2022-03-11 DIAGNOSIS — E1040 Type 1 diabetes mellitus with diabetic neuropathy, unspecified: Secondary | ICD-10-CM | POA: Diagnosis present

## 2022-03-11 DIAGNOSIS — N1831 Chronic kidney disease, stage 3a: Secondary | ICD-10-CM | POA: Diagnosis present

## 2022-03-11 DIAGNOSIS — E1069 Type 1 diabetes mellitus with other specified complication: Secondary | ICD-10-CM | POA: Diagnosis not present

## 2022-03-11 DIAGNOSIS — Z7902 Long term (current) use of antithrombotics/antiplatelets: Secondary | ICD-10-CM | POA: Diagnosis not present

## 2022-03-11 DIAGNOSIS — E10649 Type 1 diabetes mellitus with hypoglycemia without coma: Secondary | ICD-10-CM | POA: Diagnosis present

## 2022-03-11 DIAGNOSIS — L97529 Non-pressure chronic ulcer of other part of left foot with unspecified severity: Secondary | ICD-10-CM

## 2022-03-11 DIAGNOSIS — E7800 Pure hypercholesterolemia, unspecified: Secondary | ICD-10-CM | POA: Diagnosis present

## 2022-03-11 DIAGNOSIS — E10621 Type 1 diabetes mellitus with foot ulcer: Secondary | ICD-10-CM | POA: Diagnosis present

## 2022-03-11 DIAGNOSIS — E1022 Type 1 diabetes mellitus with diabetic chronic kidney disease: Secondary | ICD-10-CM | POA: Diagnosis present

## 2022-03-11 DIAGNOSIS — Z7982 Long term (current) use of aspirin: Secondary | ICD-10-CM | POA: Diagnosis not present

## 2022-03-11 DIAGNOSIS — C9000 Multiple myeloma not having achieved remission: Secondary | ICD-10-CM

## 2022-03-11 DIAGNOSIS — Z79899 Other long term (current) drug therapy: Secondary | ICD-10-CM | POA: Diagnosis not present

## 2022-03-11 DIAGNOSIS — Z8673 Personal history of transient ischemic attack (TIA), and cerebral infarction without residual deficits: Secondary | ICD-10-CM | POA: Diagnosis not present

## 2022-03-11 DIAGNOSIS — F1410 Cocaine abuse, uncomplicated: Secondary | ICD-10-CM | POA: Diagnosis present

## 2022-03-11 DIAGNOSIS — D631 Anemia in chronic kidney disease: Secondary | ICD-10-CM | POA: Diagnosis present

## 2022-03-11 DIAGNOSIS — E162 Hypoglycemia, unspecified: Secondary | ICD-10-CM | POA: Diagnosis present

## 2022-03-11 DIAGNOSIS — N170 Acute kidney failure with tubular necrosis: Secondary | ICD-10-CM | POA: Diagnosis present

## 2022-03-11 DIAGNOSIS — Z9221 Personal history of antineoplastic chemotherapy: Secondary | ICD-10-CM | POA: Diagnosis not present

## 2022-03-11 DIAGNOSIS — G9341 Metabolic encephalopathy: Secondary | ICD-10-CM | POA: Diagnosis present

## 2022-03-11 DIAGNOSIS — I129 Hypertensive chronic kidney disease with stage 1 through stage 4 chronic kidney disease, or unspecified chronic kidney disease: Secondary | ICD-10-CM | POA: Diagnosis present

## 2022-03-11 DIAGNOSIS — E875 Hyperkalemia: Secondary | ICD-10-CM | POA: Diagnosis present

## 2022-03-11 LAB — BASIC METABOLIC PANEL
Anion gap: 4 — ABNORMAL LOW (ref 5–15)
BUN: 33 mg/dL — ABNORMAL HIGH (ref 8–23)
CO2: 25 mmol/L (ref 22–32)
Calcium: 7.9 mg/dL — ABNORMAL LOW (ref 8.9–10.3)
Chloride: 110 mmol/L (ref 98–111)
Creatinine, Ser: 1.5 mg/dL — ABNORMAL HIGH (ref 0.61–1.24)
GFR, Estimated: 50 mL/min — ABNORMAL LOW (ref >60)
Glucose, Bld: 35 mg/dL — CL (ref 70–99)
Potassium: 4.8 mmol/L (ref 3.5–5.1)
Sodium: 139 mmol/L (ref 135–145)

## 2022-03-11 LAB — GLUCOSE, CAPILLARY
Glucose-Capillary: 157 mg/dL — ABNORMAL HIGH (ref 70–99)
Glucose-Capillary: 27 mg/dL — CL (ref 70–99)
Glucose-Capillary: 137 mg/dL — ABNORMAL HIGH (ref 70–99)
Glucose-Capillary: 375 mg/dL — ABNORMAL HIGH (ref 70–99)
Glucose-Capillary: 185 mg/dL — ABNORMAL HIGH (ref 70–99)
Glucose-Capillary: 117 mg/dL — ABNORMAL HIGH (ref 70–99)

## 2022-03-11 MED ORDER — INSULIN ASPART 100 UNIT/ML IJ SOLN: 0.0000 [IU] | Freq: Every day | INTRAMUSCULAR | Status: DC

## 2022-03-11 MED ORDER — INSULIN ASPART 100 UNIT/ML IJ SOLN
2.0000 [IU] | Freq: Three times a day (TID) | INTRAMUSCULAR | Status: DC
  Administered 2022-03-11 – 2022-03-12 (×3): 2 [IU] via SUBCUTANEOUS
  Filled 2022-03-11 (×3): qty 1

## 2022-03-11 MED ORDER — GLUCERNA SHAKE PO LIQD
237.0000 mL | Freq: Three times a day (TID) | ORAL | Status: DC
  Administered 2022-03-11: 237 mL via ORAL

## 2022-03-11 MED ORDER — INSULIN ASPART 100 UNIT/ML IJ SOLN
0.0000 [IU] | Freq: Three times a day (TID) | INTRAMUSCULAR | Status: DC
  Administered 2022-03-11: 5 [IU] via SUBCUTANEOUS
  Administered 2022-03-11: 1 [IU] via SUBCUTANEOUS
  Administered 2022-03-12: 3 [IU] via SUBCUTANEOUS
  Administered 2022-03-12: 4 [IU] via SUBCUTANEOUS
  Filled 2022-03-11 (×4): qty 1

## 2022-03-11 MED ORDER — DEXTROSE 50 % IV SOLN
25.0000 g | INTRAVENOUS | Status: AC
  Administered 2022-03-11: 25 g via INTRAVENOUS

## 2022-03-11 MED ORDER — DEXTROSE 50 % IV SOLN
INTRAVENOUS | Status: AC
  Filled 2022-03-11: qty 50

## 2022-03-11 NOTE — Progress Notes (Signed)
PROGRESS NOTE    Nicholas Weiss  TZG:[id number] DOB: December 02, 1950 DOA: 03/09/2022 PCP: Dion Body, MD  Assessment & Plan:   Principal Problem:   Hypoglycemia Active Problems:   Cocaine abuse ([place])   Multiple myeloma ([place])   Type 1 diabetes mellitus with diabetic neuropathy, unspecified ([facility])   Acute renal failure with acute tubular necrosis superimposed on stage 3a chronic kidney disease ([place])   Essential (primary) hypertension   PUD (peptic ulcer disease)   Anemia of chronic kidney failure, stage 3 (moderate) ([facility])   Unresponsiveness   Smoldering multiple myeloma   History of CVA (cerebrovascular accident)  Assessment and Plan: DM2 w/ hypoglycemia: intermittent episodes. Continue on SSI w/ accuchecks. DM coordinator following. Recently lost weight. Has not seen outpatient endocrinologist in 3 months as per pt   Metabolic encephalopathy: likely secondary to above. Mental status is back to baseline today    Hyperkalemia: resolved    Hx of recent traumatic pneumothorax: with left ribs 8 and 9 fractures and L1 compression fracture. Symptomatically improving. Breathing comfortably on room air satting in high 90s   Ulcers distal toes: w/ no signs of infection. Pulses not palpable. ABI ordered   Hx CVA: continue on aspirin, plavix, statin    CKD IIIa: Cr is trending down slightly from day prior. Avoid nephrotoxic meds    Multiple myeloma: holding revlimid. Management per onco outpatient    ACD: likely secondary to CKD. No need for a transfusion currently         DVT prophylaxis: heparin  Code Status: full Family Communication:  discussed pt's care w/ pt's family at bedside and answered their questions  Disposition Plan: likely d/c back home   Level of care: Telemetry Medical  Status is: Inpatient Remains inpatient appropriate because: still having hypoglycemic episodes      Consultants:    Procedures:   Antimicrobials:    Subjective: Pt c/o  fatigue   Objective: Vitals:   03/10/22 0946 03/10/22 1530 03/10/22 2136 03/11/22 0846  BP: (!) 152/92 (!) 156/82 101/65 135/88  Pulse: 68 80 80 73  Resp:  _0 Temp: 97.8 F (36.6 C) 98 F (36.7 C) 98.4 F (36.9 C) 98.4 F (36.9 C)  TempSrc: Oral  Oral   SpO2: 100% 100% 98% 100%    Intake/Output Summary (Last 24 hours) at 03/11/2022 0850 Last data filed at 03/11/2022 0700 Gross per 24 hour  Intake --  Output 1825 ml  Net -1825 ml   There were no vitals filed for this visit.  Examination:  General exam: Appears calm and comfortable  Respiratory system: Clear to auscultation. Respiratory effort normal. Cardiovascular system: S1 & S2+. No rubs, gallops or clicks.  Gastrointestinal system: Abdomen is nondistended, soft and nontender.  Normal bowel sounds heard. Central nervous system: Alert and oriented. Moves all extremities. Psychiatry: Judgement and insight appear normal. Mood & affect appropriate.     Data Reviewed: I have personally reviewed following labs and imaging studies  CBC: Recent Labs  Lab 03/06/22 1027 03/09/22 2155 03/10/22 0355  WBC 7.0 7.4 6.8  NEUTROABS 5.1  --   --   HGB 8.7* 8.8* 8.9*  HCT 27.3* 28.5* 28.8*  MCV 94.5 96.6 97.0  PLT 502* 494* 759*   Basic Metabolic Panel: Recent Labs  Lab 03/09/22 2155 03/10/22 0355 03/10/22 1144 03/10/22 2011 03/11/22 0245  NA 137 134* 135 135 139  K 5.8* 5.9* 5.4* 5.4* 4.8  CL 106 104 107 104 110  CO2 25  _0 GLUCOSE 82 235* 117* 257* 35*  BUN 42* 39* 34* 37* 33*  CREATININE 1.74* 1.60* 1.37* 1.55* 1.50*  CALCIUM 8.4* 7.9* 8.2* 8.0* 7.9*   GFR: Estimated Creatinine Clearance: 44.6 mL/min (A) (by C-G formula based on SCr of 1.5 mg/dL (H)). Liver Function Tests: Recent Labs  Lab 03/06/22 1027 03/09/22 2155 03/10/22 0355  AST 42* 25 22  ALT 60* 44 39  ALKPHOS 89 90 83  BILITOT 0.4 0.4 0.4  PROT 7.8 7.7 7.2  ALBUMIN 2.9* 3.0* 2.7*   No results for input(s): "LIPASE",  "AMYLASE" in the last 168 hours. No results for input(s): "AMMONIA" in the last 168 hours. Coagulation Profile: No results for input(s): "INR", "PROTIME" in the last 168 hours. Cardiac Enzymes: Recent Labs  Lab 03/09/22 2155  CKTOTAL 47*   BNP (last 3 results) No results for input(s): "PROBNP" in the last 8760 hours. HbA1C: Recent Labs    03/09/22 2155  HGBA1C 9.1*   CBG: Recent Labs  Lab 03/10/22 1626 03/10/22 2131 03/11/22 0256 03/11/22 0312 03/11/22 0833  GLUCAP 317* 181* 27* 157* 137*   Lipid Profile: No results for input(s): "CHOL", "HDL", "LDLCALC", "TRIG", "CHOLHDL", "LDLDIRECT" in the last 72 hours. Thyroid Function Tests: Recent Labs    03/09/22 2155  TSH 1.757  FREET4 0.57*   Anemia Panel: No results for input(s): "VITAMINB12", "FOLATE", "FERRITIN", "TIBC", "IRON", "RETICCTPCT" in the last 72 hours. Sepsis Labs: Recent Labs  Lab 03/10/22 0210  LATICACIDVEN 3.0*    No results found for this or any previous visit (from the past 240 hour(s)).       Radiology Studies: MR BRAIN WO CONTRAST  Result Date: 03/10/2022 CLINICAL DATA:  Encephalopathy EXAM: MRI HEAD WITHOUT CONTRAST TECHNIQUE: Multiplanar, multiecho pulse sequences of the brain and surrounding structures were obtained without intravenous contrast. COMPARISON:  None Available. FINDINGS: Brain: No acute infarct, mass effect or extra-axial collection. Approximately 5 bilateral periventricular chronic microhemorrhages. No acute hemorrhage. There is multifocal hyperintense T2-weighted signal within the white matter. Parenchymal volume and CSF spaces are normal. The midline structures are normal. Vascular: Major flow voids are preserved. Skull and upper cervical spine: Normal calvarium and skull base. Visualized upper cervical spine and soft tissues are normal. Sinuses/Orbits:No paranasal sinus fluid levels or advanced mucosal thickening. No mastoid or middle ear effusion. Normal orbits. IMPRESSION: 1.  No acute intracranial abnormality. 2. Findings of chronic small vessel ischemia. Electronically Signed   By: Ulyses Jarred M.D.   On: 03/10/2022 01:38        Scheduled Meds:  acyclovir  400 mg Oral Daily   albuterol  10 mg Nebulization Once   aspirin EC  81 mg Oral Daily   atorvastatin  80 mg Oral Daily   clopidogrel  75 mg Oral Daily   gabapentin  300 mg Oral q morning   And   gabapentin  600 mg Oral QHS   heparin  5,000 Units Subcutaneous Q8H   insulin aspart  0-5 Units Subcutaneous QHS   insulin aspart  0-6 Units Subcutaneous TID WC   insulin aspart  2 Units Subcutaneous TID WC   multivitamin with minerals  1 tablet Oral Daily   nicotine  21 mg Transdermal Daily   sodium chloride flush  3 mL Intravenous Q12H   thiamine  100 mg Oral Daily   Continuous Infusions:  sodium chloride 125 mL/hr at 03/10/22 2148     LOS: 0 days    Time spent: 35 mins  Wyvonnia Dusky, MD [facility] Pager [phone]  If 7PM-7AM, please contact night-coverage [email or web address] 03/11/2022, 8:50 AM

## 2022-03-11 NOTE — TOC CM/SW Note (Signed)
Patient is active with [facility] for PT and OT. They can add nursing for wound care/diabetes management.  Nicholas Weiss, [place]

## 2022-03-11 NOTE — Progress Notes (Signed)
Patient transferred off unit via wheelchair by patient transport to ultrasound.

## 2022-03-11 NOTE — Inpatient Diabetes Management (Addendum)
Inpatient Diabetes Program Recommendations  AACE/ADA: New Consensus Statement on Inpatient Glycemic Control (2015)  Target Ranges:  Prepandial:   less than 140 mg/dL      Peak postprandial:   less than 180 mg/dL (1-2 hours)      Critically ill patients:  140 - 180 mg/dL   Lab Results  Component Value Date   GLUCAP 157 (H) 03/11/2022   HGBA1C 9.1 (H) 03/09/2022    Review of Glycemic Control  Latest Reference Range & Units 03/10/22 16:26 03/10/22 21:31 03/11/22 02:56 03/11/22 03:12  Glucose-Capillary 70 - 99 mg/dL 317 (H) Novolog 7 units  181 (H) 27 (LL) 157 (H)  (LL): Data is critically low (H): Data is abnormally high  Diabetes history: DM1(does not make insulin.  Needs correction, basal and meal coverage)   Outpatient Diabetes medications:  Tresiba 20 units QAM Novolog 0-14 units TID Farxiga 10 mg QD Current orders for Inpatient glycemic control:  Novolog 0-9 units TID ENDO-Dr. Gabriel Carina  Inpatient Diabetes Program Recommendations:    Please decrease correction to very sensitive to avoid over correction & add meal coverage to avoid postprandial spikes.  Please consider:  Novolog 0-6 units TID Novolog 2 units TID with meals if he consumes at least 50%  Addendum'@1148'$ :  Med rec shows 0-33 units TID for his Novolog at home.  At most I would recommend 0-9 units TID with meals.  Might consider decreasing his basal to 10 units QAM.    He should follow up with Dr. Gabriel Carina within 1 week if possible.  Would also order GVoke at DC.  This is a rescue medication for hypoglycemia.  Will teach him and significant other how to use this medication.    GVoke order [phone]  Addendum'@1550'$ :  He is unable to find his transmitter for his Dexcom G6.  Called Dr. Joycie Peek office and was give 2 Dexcom G7 samples.  Uploaded the [place] app on his phone.  Placed Dexcom G7 to back of left arm.  He watched the videos on how to use the G7.  Provided him with an extra sensor to take home.  Will follow up  in the morning.    Will continue to follow while inpatient.  Thank you, Nicholas Dixon, MSN, [place] Diabetes Coordinator Inpatient Diabetes Program [phone] (team pager from 8a-5p)

## 2022-03-11 NOTE — Progress Notes (Signed)
Hypoglycemic Event  CBG: 27  Treatment: D50 50 mL (25 gm)  Symptoms:  Hard to arouse/Confused  Follow-up CBG: Time:0300 CBG Result:157  Possible Reasons for Event: Unknown  Comments/MD notified:Hypoglycemia protocol followed and issue resolved.    Phillip Heal, [place]

## 2022-03-12 ENCOUNTER — Encounter: Payer: Self-pay | Admitting: Oncology

## 2022-03-12 DIAGNOSIS — E1069 Type 1 diabetes mellitus with other specified complication: Secondary | ICD-10-CM

## 2022-03-12 LAB — MULTIPLE MYELOMA PANEL, SERUM
Albumin SerPl Elph-Mcnc: 2.8 g/dL — ABNORMAL LOW (ref 2.9–4.4)
Albumin/Glob SerPl: 0.7 (ref 0.7–1.7)
Alpha 1: 0.4 g/dL (ref 0.0–0.4)
Alpha2 Glob SerPl Elph-Mcnc: 1 g/dL (ref 0.4–1.0)
B-Globulin SerPl Elph-Mcnc: 0.8 g/dL (ref 0.7–1.3)
Gamma Glob SerPl Elph-Mcnc: 2.1 g/dL — ABNORMAL HIGH (ref 0.4–1.8)
Globulin, Total: 4.3 g/dL — ABNORMAL HIGH (ref 2.2–3.9)
IgA: 83 mg/dL (ref 61–437)
IgG (Immunoglobin G), Serum: 2463 mg/dL — ABNORMAL HIGH (ref 603–1613)
IgM (Immunoglobulin M), Srm: 16 mg/dL — ABNORMAL LOW (ref 20–172)
M Protein SerPl Elph-Mcnc: 1.8 g/dL — ABNORMAL HIGH
Total Protein ELP: 7.1 g/dL (ref 6.0–8.5)

## 2022-03-12 LAB — CBC
HCT: 24.8 % — ABNORMAL LOW (ref 39.0–52.0)
Hemoglobin: 8 g/dL — ABNORMAL LOW (ref 13.0–17.0)
MCH: 30.1 pg (ref 26.0–34.0)
MCHC: 32.3 g/dL (ref 30.0–36.0)
MCV: 93.2 fL (ref 80.0–100.0)
Platelets: 411 10*3/uL — ABNORMAL HIGH (ref 150–400)
RBC: 2.66 MIL/uL — ABNORMAL LOW (ref 4.22–5.81)
RDW: 15.4 % (ref 11.5–15.5)
WBC: 5.7 10*3/uL (ref 4.0–10.5)
nRBC: 0 % (ref 0.0–0.2)

## 2022-03-12 LAB — BASIC METABOLIC PANEL
Anion gap: 5 (ref 5–15)
BUN: 35 mg/dL — ABNORMAL HIGH (ref 8–23)
CO2: 20 mmol/L — ABNORMAL LOW (ref 22–32)
Calcium: 7.7 mg/dL — ABNORMAL LOW (ref 8.9–10.3)
Chloride: 107 mmol/L (ref 98–111)
Creatinine, Ser: 1.38 mg/dL — ABNORMAL HIGH (ref 0.61–1.24)
GFR, Estimated: 56 mL/min — ABNORMAL LOW (ref >60)
Glucose, Bld: 368 mg/dL — ABNORMAL HIGH (ref 70–99)
Potassium: 5.6 mmol/L — ABNORMAL HIGH (ref 3.5–5.1)
Sodium: 132 mmol/L — ABNORMAL LOW (ref 135–145)

## 2022-03-12 LAB — GLUCOSE, CAPILLARY
Glucose-Capillary: 347 mg/dL — ABNORMAL HIGH (ref 70–99)
Glucose-Capillary: 350 mg/dL — ABNORMAL HIGH (ref 70–99)
Glucose-Capillary: 254 mg/dL — ABNORMAL HIGH (ref 70–99)
Glucose-Capillary: 254 mg/dL — ABNORMAL HIGH (ref 70–99)
Glucose-Capillary: 125 mg/dL — ABNORMAL HIGH (ref 70–99)

## 2022-03-12 LAB — PHOSPHORUS: Phosphorus: 3.3 mg/dL (ref 2.5–4.6)

## 2022-03-12 LAB — MAGNESIUM: Magnesium: 1.9 mg/dL (ref 1.7–2.4)

## 2022-03-12 LAB — POTASSIUM: Potassium: 4.1 mmol/L (ref 3.5–5.1)

## 2022-03-12 MED ORDER — SODIUM ZIRCONIUM CYCLOSILICATE 10 G PO PACK
10.0000 g | PACK | Freq: Once | ORAL | Status: AC
  Administered 2022-03-12: 10 g via ORAL
  Filled 2022-03-12: qty 1

## 2022-03-12 MED ORDER — INSULIN GLARGINE-YFGN 100 UNIT/ML ~~LOC~~ SOLN
15.0000 [IU] | Freq: Every day | SUBCUTANEOUS | Status: DC
  Administered 2022-03-12: 15 [IU] via SUBCUTANEOUS
  Filled 2022-03-12: qty 0.15

## 2022-03-12 MED ORDER — NOVOLOG 100 UNIT/ML IJ SOLN: 0.0000 [IU] | Freq: Three times a day (TID) | INTRAMUSCULAR | 1 refills | Status: DC

## 2022-03-12 MED ORDER — TRESIBA FLEXTOUCH 100 UNIT/ML ~~LOC~~ SOPN: 15.0000 [IU] | PEN_INJECTOR | Freq: Every day | SUBCUTANEOUS | Status: DC

## 2022-03-12 NOTE — TOC Initial Note (Signed)
Transition of Care [facility]) - Initial/Assessment Note    Patient Details  Name: Nicholas Weiss MRN: [id number] Date of Birth: 1951-03-03  Transition of [facility]) CM/SW Contact:    Candie Chroman, LCSW Phone Number: 03/12/2022, 10:26 AM  Clinical Narrative:   Readmission prevention screen complete. CSW met with patient. No supports at bedside. CSW introduced role and explained that discharge planning would be discussed. PCP is Dr. Netty Starring. Patient drives himself to appointments. Pharmacy is Paediatric nurse on [address]. No issues obtaining medications. Patient lives home with his significant other. He confirmed he is active with [facility] and agreeable to adding RN. Patient has recently been using a single-point cane. No further concerns. CSW encouraged patient to contact CSW as needed. CSW will continue to follow patient for support and facilitate return home once stable. Significant other will transport him home at discharge.               Expected Discharge Plan: [place] Barriers to Discharge: Continued Medical Work up   Patient Goals and CMS Choice     Choice offered to / list presented to : Patient  Expected Discharge Plan and Services Expected Discharge Plan: [place] Acute Care Choice: Resumption of Svcs/PTA Provider Living arrangements for the past 2 months: Single Family Home                           HH Arranged: RN, PT, OT [facility] Agency: [place] Date [place]: 03/11/22   Representative spoke with at [place]: [place] Arrangements/Services Living arrangements for the past 2 months: [place] Lives with:: Significant Other Patient language and need for interpreter reviewed:: Yes Do you feel safe going back to the place where you live?: Yes      Need for Family Participation in Patient Care: Yes (Comment) Care giver support system in place?: Yes  (comment) Current home services: DME, Home OT, Home PT Criminal Activity/Legal Involvement Pertinent to Current Situation/Hospitalization: No - Comment as needed  Activities of Daily Living Home Assistive Devices/Equipment: Cane (specify quad or straight) (straight) ADL Screening (condition at time of admission) Patient's cognitive ability adequate to safely complete daily activities?: Yes Is the patient deaf or have difficulty hearing?: No Does the patient have difficulty seeing, even when wearing glasses/contacts?: No Does the patient have difficulty concentrating, remembering, or making decisions?: No Patient able to express need for assistance with ADLs?: Yes Does the patient have difficulty dressing or bathing?: No Independently performs ADLs?: Yes (appropriate for developmental age) Does the patient have difficulty walking or climbing stairs?: No Weakness of Legs: Both Weakness of Arms/Hands: None  Permission Sought/Granted Permission sought to share information with : Facility Art therapist granted to share information with : Yes, Verbal Permission Granted     Permission granted to share info w AGENCY: [place]        Emotional Assessment Appearance:: Appears stated age Attitude/Demeanor/Rapport: Engaged, Gracious Affect (typically observed): Accepting, Appropriate, Calm, Pleasant Orientation: : Oriented to Self, Oriented to Place, Oriented to  Time, Oriented to Situation Alcohol / Substance Use: Not Applicable Psych Involvement: No (comment)  Admission diagnosis:  Hypoglycemia [E16.2] Patient Active Problem List   Diagnosis Date Noted   Traumatic pneumothorax 02/18/2022   Multiple fractures of ribs, left side, initial encounter for closed fracture 02/18/2022   Pneumothorax, closed, traumatic  02/18/2022   Benign prostatic hyperplasia with nocturia 02/12/2022   Stroke (cerebrum) ([place]) 11/07/2021   Multiple myeloma ([place]) 09/29/2021    Multiple myeloma not having achieved remission ([place]) 09/29/2021   Chronic hyponatremia 08/18/2021   History of CVA (cerebrovascular accident) 08/13/2021   Syncope and collapse 08/09/2021   Hepatic lesion 08/05/2021   Dyslipidemia 05/08/2021   History of substance abuse ([place]) 12/09/2020   Cocaine abuse ([place]) 11/30/2020   Anemia of chronic disease 11/08/2020   Muscle twitching 09/12/2020   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   Pre-syncope 08/01/2020   Heart block AV first degree 08/01/2020   Anemia of chronic kidney failure, stage 3 (moderate) ([place]) 05/28/2020   PUD (peptic ulcer disease)    Esophageal dysphagia    Encounter for screening colonoscopy    Generalized weakness 07/28/2019   Hypoglycemia 04/27/2019   Unresponsiveness 04/27/2019   Lung nodule 04/07/2019   Uncontrolled type 1 diabetes mellitus    Acute renal failure with acute tubular necrosis superimposed on stage 3a chronic kidney disease ([place])    CKD (chronic kidney disease) stage 3, GFR 30-59 ml/min ([place]) 04/05/2019   Hypercholesterolemia 01/24/2016   Essential (primary) hypertension 06/07/2006   Type 1 diabetes mellitus with diabetic neuropathy, unspecified ([place]) 05/31/2006   PCP:  Dion Body, MD Pharmacy:   [facility] [address]), [place]) [place] [address] Phone: [phone] Fax: [phone]  [facility] [place], [place] [address] Phone: [phone] Fax: [phone]  [place], [place] [address] Phone: [phone] Fax: [phone]  [name] [address] [place] [address] Phone: [phone] Fax: [phone]     Social Determinants of Health (SDOH) Interventions    Readmission Risk Interventions    03/12/2022   10:22  AM 02/25/2022    2:43 PM 12/08/2021   10:33 AM  Readmission Risk Prevention Plan  Transportation Screening Complete Complete Complete  Medication Review ([place]) Complete Complete Complete  PCP or Specialist appointment within 3-5 days of discharge Complete Complete Complete  HRI or Home Care Consult Complete Complete Complete  SW Recovery Care/Counseling Consult Complete  Complete  Palliative Care Screening Not Applicable Not Applicable Not [place] Not Applicable Not Applicable Not Applicable

## 2022-03-12 NOTE — Progress Notes (Signed)
Hematology/Oncology Consult note [facility]  [phone] [phone]  Patient Care Team: Dion Body, MD as PCP - General (Family Medicine) [place], [place] (Optometry) Sindy Guadeloupe, MD as Consulting Physician (Oncology)   Name of the patient: Nicholas Weiss  [id number]  03-16-1951   Date of visit: 03/12/22  Diagnosis- high risk IgG lambda multiple myeloma R-ISS stage III    Chief complaint/ Reason for visit-on treatment assessment prior to cycle 7-day 1 of Vel[place]  Heme/Onc history: Patient is a 71 year old African-American male with a past medical history significant for uncontrolled type 1 diabetes, chronic kidney disease stage III chronic normocytic anemia referred for abnormal SPEP. Patient's creatinine has been fluctuating between 1.5-2 but about 5 months ago it went up all the way to 5 and his blood sugars were in the 1000 range. More recently his kidney numbers have been drifting back to normal values. As a part of the work-up he had serum protein electrophoresis done which showed an elevated gammaglobulin fraction with an M spike of 3%. The amount of M spike was not quantified in the specimen. He has therefore been referred to [place] for further management. Patient endorses chronic fatigue reports that his appetite and weight have remained stable   Results of blood work from 04/08/2020 were as follows: CBC showed white count of 2.9, H&H of 10.5/31.2 with an MCV of 91 and a platelet count of 191.  CMP showed a mildly elevated creatinine of 1.2 which was better as compared to 5 months ago when it was 3.9.  Total protein was mildly elevated at 9.1 calcium normal at 8.4 ferritin and iron studies B12 folate TSH and haptoglobin were normal.  Myeloma panel revealed an elevated IgG level of 06/04/2004 with an M protein of 3.1 g.  Immunofixation showed IgG lambda specificity.  Serum free light chain ratio was elevated at 25 and free light  chain lambda elevated at 370   Further myeloma work-up including a PET CT scan did not reveal any evidence of edematous lesions.  Bone marrow biopsy showed 23% plasma cells by manual count and 30% by CD138 IHC.  Normal cytogenetics.  FISH studies for myeloma showed gain of 1 q.  13 q-. detected.  P53 not detected.   He was treated for a year and has having smoldering multiple myeloma since both CKD and anemia have been stable for 3 to 4 years and could be secondary to uncontrolled diabetes.In June 2023 IgG levels increased to 5463 with an M protein of 4.1 g as compared to 2.6 g in September 2022.  Serum free light chain ratio remains around 25.  Therefore a repeat bone marrow biopsy was done which shows further increase in plasma cells ranging from 30 to 70% and by immunohistochemistry 60 to 70% of the cells were positive for CD138.  Cytogenetics normal.  FISH study showed 4: 14 translocation and gain of 1 q. making this high risk RISS stage III.  Repeat PET scan showed no lytic lesions      Interval history-patient had a mechanical fall while getting out of his bed resulting in rib fractures and traumatic pneumothorax.  He currently has a brace in place.  He is slowly recovering from his recent hospitalization.  ECOG PS- 2 Pain scale- 4 Opioid associated constipation- no  Review of systems- Review of Systems  Constitutional:  Negative for chills, fever, malaise/fatigue and weight loss.  HENT:  Negative for congestion, ear discharge and nosebleeds.  Eyes:  Negative for blurred vision.  Respiratory:  Negative for cough, hemoptysis, sputum production, shortness of breath and wheezing.        Chest wall pain  Cardiovascular:  Negative for chest pain, palpitations, orthopnea and claudication.  Gastrointestinal:  Negative for abdominal pain, blood in stool, constipation, diarrhea, heartburn, melena, nausea and vomiting.  Genitourinary:  Negative for dysuria, flank pain, frequency, hematuria and  urgency.  Musculoskeletal:  Negative for back pain, joint pain and myalgias.  Skin:  Negative for rash.  Neurological:  Negative for dizziness, tingling, focal weakness, seizures, weakness and headaches.  Endo/Heme/Allergies:  Does not bruise/bleed easily.  Psychiatric/Behavioral:  Negative for depression and suicidal ideas. The patient does not have insomnia.       Allergies  Allergen Reactions   Penicillins Other (See Comments)    Childhood allergy Unknown reaction Has patient had a PCN reaction causing immediate rash, facial/tongue/throat swelling, SOB or lightheadedness with hypotension: No Has patient had a PCN reaction causing severe rash involving mucus membranes or skin necrosis: No Has patient had a PCN reaction that required hospitalization No Has patient had a PCN reaction occurring within the last 10 years: No If all of the above answers are "NO", then may proceed with Cephalosporin use.      Past Medical History:  Diagnosis Date   DKA (diabetic ketoacidoses) 04/06/2016   Hypercholesteremia    Hypertension      Past Surgical History:  Procedure Laterality Date   COLONOSCOPY WITH PROPOFOL N/A 02/01/2020   Procedure: COLONOSCOPY WITH PROPOFOL;  Surgeon: Lin Landsman, MD;  Location: [facility] ENDOSCOPY;  Service: Gastroenterology;  Laterality: N/A;   ESOPHAGOGASTRODUODENOSCOPY  02/01/2020   Procedure: ESOPHAGOGASTRODUODENOSCOPY (EGD);  Surgeon: Lin Landsman, MD;  Location: [facility] ENDOSCOPY;  Service: Gastroenterology;;   KNEE SURGERY Right    Torn meniscus   KNEE SURGERY Left     Social History   Socioeconomic History   Marital status: Single    Spouse name: Not on file   Number of children: 3   Years of education: Not on file   Highest education level: High school graduate  Occupational History    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  Tobacco Use   Smoking status: Never   Smokeless tobacco: Never  Vaping Use   Vaping Use: Never used  Substance and  Sexual Activity   Alcohol use: Not Currently    Alcohol/week: 0.0 - 1.0 standard drinks of alcohol    Comment: "once every 2 months"   Drug use: Not Currently    Types: Marijuana, "Crack" cocaine    Comment: last week   Sexual activity: Not Currently    Birth control/protection: None  Other Topics Concern   Not on file  Social History Narrative   Lives with girlfriend "sharon"   Social Determinants of Health   Financial Resource Strain: Low Risk  (01/01/2020)   Overall Financial Resource Strain (CARDIA)    Difficulty of Paying Living Expenses: Not hard at all  Food Insecurity: No Food Insecurity (03/10/2022)   Hunger Vital Sign    Worried About Running Out of Food in the Last Year: Never true    Ran Out of Food in the Last Year: Never true  Transportation Needs: No Transportation Needs (03/10/2022)   PRAPARE - Hydrologist (Medical): No    Lack of Transportation (Non-Medical): No  Physical Activity: Insufficiently Active (01/01/2020)   Exercise Vital Sign    Days of Exercise per Week: 7  days    Minutes of Exercise per Session: 20 min  Stress: No Stress Concern Present (01/01/2020)   [place]    Feeling of Stress : Not at all  Social Connections: Moderately Isolated (01/01/2020)   Social Connection and Isolation Panel [NHANES]    Frequency of Communication with Friends and Family: More than three times a week    Frequency of Social Gatherings with Friends and Family: More than three times a week    Attends Religious Services: Never    Marine scientist or Organizations: No    Attends Archivist Meetings: Never    Marital Status: Living with partner  Intimate Partner Violence: Not At Risk (03/10/2022)   Humiliation, Afraid, Rape, and Kick questionnaire    Fear of Current or Ex-Partner: No    Emotionally Abused: No    Physically Abused: No    Sexually Abused: No     Family History  Problem Relation Age of Onset   Heart attack Father    Hypertension Sister    Cancer Sister      Current Outpatient Medications:    acetaminophen (TYLENOL) 500 MG tablet, Take 1,000 mg by mouth daily as needed for mild pain or moderate pain., Disp: , Rfl:    acyclovir (ZOVIRAX) 400 MG tablet, Take 400 mg by mouth in the morning. (Patient not taking: Reported on 03/10/2022), Disp: , Rfl:    aspirin EC 81 MG tablet, Take 81 mg by mouth in the morning., Disp: , Rfl:    atorvastatin (LIPITOR) 80 MG tablet, Take 1 tablet (80 mg total) by mouth at bedtime. (Patient taking differently: Take 80 mg by mouth in the morning.), Disp: 30 tablet, Rfl: 2   Baclofen 5 MG TABS, Take 5 mg by mouth daily as needed (muscle spasm)., Disp: , Rfl:    clopidogrel (PLAVIX) 75 MG tablet, Take 75 mg by mouth in the morning., Disp: , Rfl:    dicyclomine (BENTYL) 20 MG tablet, Take 1 tablet (20 mg total) by mouth 3 (three) times daily with meals as needed for spasms., Disp: 90 tablet, Rfl: 0   FARXIGA 10 MG TABS tablet, Take 10 mg by mouth in the morning., Disp: , Rfl:    gabapentin (NEURONTIN) 300 MG capsule, Take 300-600 mg by mouth See admin instructions. 300 mg in the morning, 600 mg at night., Disp: , Rfl:    lenalidomide (REVLIMID) 10 MG capsule, Take 1 capsule (10 mg total) by mouth daily. Take for 14 days, then hold for 7 days. Repeat every 21 days., Disp: 6 capsule, Rfl: 0   ondansetron (ZOFRAN) 8 MG tablet, Take 8 mg by mouth daily as needed for nausea or vomiting., Disp: , Rfl:    prochlorperazine (COMPAZINE) 10 MG tablet, Take 10 mg by mouth daily as needed for nausea or vomiting., Disp: , Rfl:    docusate sodium (COLACE) 100 MG capsule, Take 100 mg by mouth daily as needed for mild constipation., Disp: , Rfl:    insulin degludec (TRESIBA FLEXTOUCH) 100 UNIT/ML FlexTouch Pen, Inject 15 Units into the skin daily., Disp: , Rfl:    NOVOLOG 100 UNIT/ML injection, Inject 0-9 Units into the  skin 3 (three) times daily with meals., Disp: 10 mL, Rfl: 1 No current facility-administered medications for this visit.  Facility-Administered Medications Ordered in Other Visits:    0.9 %  sodium chloride infusion, , Intravenous, Continuous, Wouk, Ailene Rud, MD, Last Rate: 125 mL/hr at  03/12/22 1341, Restarted at 03/12/22 1341   acetaminophen (TYLENOL) tablet 650 mg, 650 mg, Oral, Q6H PRN, 650 mg at 03/12/22 0849 **OR** acetaminophen (TYLENOL) suppository 650 mg, 650 mg, Rectal, Q6H PRN, Para Skeans, MD   acyclovir (ZOVIRAX) 200 MG capsule 400 mg, 400 mg, Oral, Daily, Belue, Alver Sorrow, RPH, 400 mg at 03/12/22 0850   albuterol (PROVENTIL) (2.5 MG/3ML) 0.083% nebulizer solution 10 mg, 10 mg, Nebulization, Once, Wouk, Ailene Rud, MD   aspirin EC tablet 81 mg, 81 mg, Oral, Daily, Florina Ou V, MD, 81 mg at 03/12/22 0849   atorvastatin (LIPITOR) tablet 80 mg, 80 mg, Oral, Daily, Florina Ou V, MD, 80 mg at 03/12/22 0849   clopidogrel (PLAVIX) tablet 75 mg, 75 mg, Oral, Daily, Florina Ou V, MD, 75 mg at 03/12/22 0850   feeding supplement (GLUCERNA SHAKE) (GLUCERNA SHAKE) liquid 237 mL, 237 mL, Oral, TID BM, Wyvonnia Dusky, MD, 237 mL at 03/11/22 2155   gabapentin (NEURONTIN) capsule 300 mg, 300 mg, Oral, q morning, 300 mg at 03/12/22 0849 **AND** gabapentin (NEURONTIN) capsule 600 mg, 600 mg, Oral, QHS, Belue, Alver Sorrow, RPH, 600 mg at 03/11/22 2156   heparin injection 5,000 Units, 5,000 Units, Subcutaneous, Q8H, Florina Ou V, MD, 5,000 Units at 03/12/22 1341   insulin aspart (novoLOG) injection 0-5 Units, 0-5 Units, Subcutaneous, QHS, Williams, Jamiese M, MD   insulin aspart (novoLOG) injection 0-6 Units, 0-6 Units, Subcutaneous, TID WC, Wyvonnia Dusky, MD, 3 Units at 03/12/22 1219   insulin aspart (novoLOG) injection 2 Units, 2 Units, Subcutaneous, TID WC, Wyvonnia Dusky, MD, 2 Units at 03/12/22 0850   insulin glargine-yfgn (SEMGLEE) injection 15 Units, 15 Units, Subcutaneous,  Daily, Wyvonnia Dusky, MD, 15 Units at 03/12/22 0946   multivitamin with minerals tablet 1 tablet, 1 tablet, Oral, Daily, Florina Ou V, MD, 1 tablet at 03/12/22 0849   nicotine (NICODERM CQ - dosed in mg/24 hours) patch 21 mg, 21 mg, Transdermal, Daily, Posey Pronto, Ekta V, MD   sodium chloride flush (NS) 0.9 % injection 3 mL, 3 mL, Intravenous, Q12H, Florina Ou V, MD, 3 mL at 03/11/22 1120   thiamine (VITAMIN B1) tablet 100 mg, 100 mg, Oral, Daily, Florina Ou V, MD, 100 mg at 03/12/22 0850  Physical exam:  Vitals:   03/06/22 1053  BP: 102/71  Pulse: 81  Resp: 16  Temp: (!) 96.7 F (35.9 C)  TempSrc: Tympanic  Weight: 147 lb 8 oz (66.9 kg)  Height: _0  (1.778 m)   Physical Exam Constitutional:      Comments: Sitting in a wheelchair.  Has a brace in place for the chest wall  Cardiovascular:     Rate and Rhythm: Normal rate and regular rhythm.     Heart sounds: Normal heart sounds.  Pulmonary:     Effort: Pulmonary effort is normal.     Breath sounds: Normal breath sounds.  Abdominal:     General: Bowel sounds are normal.     Palpations: Abdomen is soft.  Skin:    General: Skin is warm and dry.  Neurological:     Mental Status: He is alert and oriented to person, place, and time.         Latest Ref Rng & Units 03/12/2022    2:52 PM  CMP  Potassium 3.5 - 5.1 mmol/L 4.1       Latest Ref Rng & Units 03/12/2022    5:37 AM  CBC  WBC 4.0 - 10.5 K/uL 5.7  Hemoglobin 13.0 - 17.0 g/dL 8.0   Hematocrit 39.0 - 52.0 % 24.8   Platelets 150 - 400 K/uL 411     No images are attached to the encounter.  US ARTERIAL ABI (SCREENING LOWER EXTREMITY)  Result Date: 03/11/2022 CLINICAL DATA:  Toe ulcers. EXAM: NONINVASIVE PHYSIOLOGIC VASCULAR STUDY OF BILATERAL LOWER EXTREMITIES TECHNIQUE: Evaluation of both lower extremities were performed at rest, including calculation of ankle-brachial indices with single level Doppler, pressure and pulse volume recording. COMPARISON:  None  Available. FINDINGS: Right ABI:  1.16 Left ABI:  1.24 Right Lower Extremity:  Normal arterial waveforms at the ankle. Left Lower Extremity:  Normal arterial waveforms at the ankle. 1.0-1.4 Normal IMPRESSION: Normal ankle-brachial indices bilaterally. Electronically Signed   By: Markus Daft M.D.   On: 03/11/2022 16:47   MR BRAIN WO CONTRAST  Result Date: 03/10/2022 CLINICAL DATA:  Encephalopathy EXAM: MRI HEAD WITHOUT CONTRAST TECHNIQUE: Multiplanar, multiecho pulse sequences of the brain and surrounding structures were obtained without intravenous contrast. COMPARISON:  None Available. FINDINGS: Brain: No acute infarct, mass effect or extra-axial collection. Approximately 5 bilateral periventricular chronic microhemorrhages. No acute hemorrhage. There is multifocal hyperintense T2-weighted signal within the white matter. Parenchymal volume and CSF spaces are normal. The midline structures are normal. Vascular: Major flow voids are preserved. Skull and upper cervical spine: Normal calvarium and skull base. Visualized upper cervical spine and soft tissues are normal. Sinuses/Orbits:No paranasal sinus fluid levels or advanced mucosal thickening. No mastoid or middle ear effusion. Normal orbits. IMPRESSION: 1. No acute intracranial abnormality. 2. Findings of chronic small vessel ischemia. Electronically Signed   By: Ulyses Jarred M.D.   On: 03/10/2022 01:38   DG Chest Port 1 View  Result Date: 02/24/2022 CLINICAL DATA:  Chest pain history of fall EXAM: PORTABLE CHEST 1 VIEW COMPARISON:  Chest radiograph 1 day prior FINDINGS: The heart is enlarged, unchanged. The upper mediastinal contours are stable. Aeration of the lung bases is improved compared to the study from 1 day prior. There is no new or worsening focal airspace disease. There is a probable trace left pleural effusion. There is no significant right effusion. There is no pneumothorax The IMPRESSION: 1. Improved aeration of the lung bases since the  study from 1 day prior with a probable trace residual left pleural effusion. No appreciable pneumothorax. 2. Unchanged left ninth rib fracture and associated subcutaneous emphysema. Electronically Signed   By: Valetta Mole M.D.   On: 02/24/2022 10:55   DG CHEST PORT 1 VIEW  Result Date: 02/23/2022 CLINICAL DATA:  Follow-up left pneumothorax. EXAM: PORTABLE CHEST 1 VIEW COMPARISON:  02/22/2022.  CT, 02/17/2022. FINDINGS: No visualized pneumothorax. Left anterolateral and neck base subcutaneous emphysema is stable. Mildly displaced fracture of the left lateral ninth rib, also unchanged. Bilateral lung base opacities consistent with atelectasis, stable on the left, improved on the right the previous exam. Remainder of the lungs is clear. IMPRESSION: 1. No visualized residual left pneumothorax. 2. Improved right lung base atelectasis. No other change from the previous exam. Electronically Signed   By: Lajean Manes M.D.   On: 02/23/2022 10:10   DG CHEST PORT 1 VIEW  Result Date: 02/22/2022 CLINICAL DATA:  Pneumothorax. EXAM: PORTABLE CHEST 1 VIEW COMPARISON:  February 22, 2022. FINDINGS: The heart size and mediastinal contours are within normal limits. Left-sided chest tube has been removed. Minimal left apical pneumothorax is noted. Bibasilar atelectasis is noted. Stable subcutaneous emphysema is seen over left lateral chest wall and supraclavicular region. The visualized  skeletal structures are unremarkable. IMPRESSION: Minimal left apical pneumothorax is noted status post left-sided chest tube removal. Stable subcutaneous emphysema is noted. Mild bibasilar atelectasis is noted. Electronically Signed   By: Marijo Conception M.D.   On: 02/22/2022 12:24   DG CHEST PORT 1 VIEW  Result Date: 02/22/2022 CLINICAL DATA:  Chest tube EXAM: PORTABLE CHEST 1 VIEW COMPARISON:  02/21/2022 FINDINGS: Left chest tube remains in place without evidence for pneumothorax. Skin fold projects over the left lung apex.  Subsegmental atelectasis noted right base. The cardiopericardial silhouette is within normal limits for size. Subcutaneous emphysema persists in the left chest wall. Telemetry leads overlie the chest. IMPRESSION: Left chest tube remains in place without evidence for pneumothorax. Electronically Signed   By: Misty Stanley M.D.   On: 02/22/2022 09:30   DG CHEST PORT 1 VIEW  Result Date: 02/21/2022 CLINICAL DATA:  Follow-up left-sided pneumothorax. EXAM: PORTABLE CHEST 1 VIEW COMPARISON:  Chest x-ray 02/20/2022 FINDINGS: The left sided chest tube is stable. No pneumothorax. Stable subcutaneous emphysema. Persistent right lower lobe atelectasis. IMPRESSION: Stable left sided chest tube without pneumothorax. Stable subsegmental atelectasis at the right lung base. Electronically Signed   By: Marijo Sanes M.D.   On: 02/21/2022 11:29   DG CHEST PORT 1 VIEW  Result Date: 02/20/2022 CLINICAL DATA:  LEFT pneumothorax, follow-up EXAM: PORTABLE CHEST 1 VIEW COMPARISON:  Portable exam 0651 hours compared to 02/19/2022 FINDINGS: Pigtail LEFT thoracostomy tube unchanged. Upper normal heart size. Mediastinal contours and pulmonary vascularity normal. Atherosclerotic calcification aorta. Bibasilar atelectasis greater on RIGHT. Skin fold projects over LEFT chest. No acute infiltrate, pleural effusion, or pneumothorax. LEFT chest wall emphysema extending into LEFT cervical region. IMPRESSION: Bibasilar atelectasis greater on RIGHT. LEFT thoracostomy tube without pneumothorax. Aortic Atherosclerosis (ICD10-I70.0). Electronically Signed   By: Lavonia Dana M.D.   On: 02/20/2022 08:38   DG CHEST PORT 1 VIEW  Result Date: 02/19/2022 CLINICAL DATA:  Trauma.  Rib fracture. EXAM: PORTABLE CHEST 1 VIEW COMPARISON:  02/18/2022 FINDINGS: Stable asymmetric elevation right hemidiaphragm with right base atelectasis. Left pleural drain again noted without definite evidence for left-sided pneumothorax. Subcutaneous emphysema noted left  chest wall. Cardiopericardial silhouette is at upper limits of normal for size. Lower left rib fracture evident. IMPRESSION: 1. Left pleural drain without definite evidence for pneumothorax. Left heart border and left hemidiaphragm are well defined raising the question of anteromedial pleural air. 2. Stable asymmetric elevation right hemidiaphragm with right base atelectasis. Electronically Signed   By: Misty Stanley M.D.   On: 02/19/2022 08:48   DG Chest Portable 1 View  Result Date: 02/18/2022 CLINICAL DATA:  LEFT chest tube placement EXAM: PORTABLE CHEST 1 VIEW COMPARISON:  Portable exam 0944 hours compared to 0130 hours FINDINGS: Pigtail LEFT thoracostomy tube identified with significant decrease in previously seen LEFT pneumothorax. Small loculated pneumothorax at medial lower LEFT lung. Normal heart size, mediastinal contours, and pulmonary vascularity. Subsegmental atelectasis RIGHT base, mildly in retrocardiac LEFT lower lobe. No pleural effusion or infiltrate seen. Atherosclerotic calcification aorta. LEFT chest wall emphysema extending into cervical region. Displaced fracture of the posterolateral LEFT ninth rib noted. IMPRESSION: Significant decrease in LEFT pneumothorax following chest tube insertion. Bibasilar atelectasis. Displaced fracture posterolateral LEFT ninth rib. Electronically Signed   By: Lavonia Dana M.D.   On: 02/18/2022 09:56   DG CHEST PORT 1 VIEW  Result Date: 02/18/2022 CLINICAL DATA:  Left pneumothorax. EXAM: PORTABLE CHEST 1 VIEW COMPARISON:  02/17/2022. FINDINGS: The heart size and mediastinal contours are  stable. There is atherosclerotic calcification of the aorta. There is redemonstration of a left pneumothorax of approximately 23%, slightly increased from the prior exam. Subcutaneous emphysema is noted in the supraclavicular, cervical region, axilla, and lateral chest wall on the left. The right lung is clear. The previously described left rib fractures are not well seen  on this exam. IMPRESSION: 1. Slightly increased pneumothorax on the left of approximately 23%. 2. Stable subcutaneous emphysema. Electronically Signed   By: Brett Fairy M.D.   On: 02/18/2022 01:43   DG Chest Portable 1 View  Result Date: 02/17/2022 CLINICAL DATA:  Evaluate pneumothorax.  Fall. EXAM: PORTABLE CHEST 1 VIEW COMPARISON:  Chest x-ray 12/06/2021.  Chest CT 02/17/2022. FINDINGS: Small left pneumothorax is present (proximally 20%). This is stable to minimally increased compared to the prior CT given differences in technique. Left chest wall emphysema is again seen. There is no evidence for mediastinal shift or focal lung infiltrate. The cardiomediastinal silhouette is within normal limits. Osseous structures appear stable. IMPRESSION: 1. Small left pneumothorax, stable to minimally increased compared to the prior CT given differences in technique. 2. Left chest wall emphysema. Electronically Signed   By: Ronney Asters M.D.   On: 02/17/2022 21:35   CT ABDOMEN PELVIS WO CONTRAST  Result Date: 02/17/2022 CLINICAL DATA:  Blunt abdominal trauma. Fall getting out of bed. Additional falls in the kitchen. EXAM: CT ABDOMEN AND PELVIS WITHOUT CONTRAST TECHNIQUE: Multidetector CT imaging of the abdomen and pelvis was performed following the standard protocol without IV contrast. RADIATION DOSE REDUCTION: This exam was performed according to the departmental dose-optimization program which includes automated exposure control, adjustment of the mA and/or kV according to patient size and/or use of iterative reconstruction technique. COMPARISON:  Included portions from chest CT earlier today. Abdominopelvic CT 08/10/2021 FINDINGS: Lower chest: Left basilar pneumothorax, increased from chest CT earlier today. Again seen subcutaneous emphysema in the left chest wall. Left lateral eighth and ninth rib fractures again seen. Hepatobiliary: Lack of contrast limits detailed assessment, allowing for this there is no  evidence of injury. Gallbladder physiologically distended, no calcified stone. No biliary dilatation. Pancreas: Unremarkable unenhanced appearance, no evidence of injury. Spleen: Lack of IV contrast limits detailed assessment. Allowing for this, no evidence of splenic injury. No perisplenic free fluid. Adrenals/Urinary Tract: No adrenal hemorrhage or renal injury identified. Bladder is unremarkable. Stomach/Bowel: No obvious bowel injury on this unenhanced exam. There is mild fluid distention of the duodenum. Scattered fluid within nondilated small bowel. No definite bowel wall thickening or inflammation. Moderate to large colonic stool burden. Normal appendix visualized. Vascular/Lymphatic: Advanced aortic atherosclerosis. There is no perivascular stranding or retroperitoneal fluid to suggest injury. No bulky abdominopelvic adenopathy. Reproductive: Enlarged prostate gland causes mass effect on the bladder base. Other: No free air or free fluid. Musculoskeletal: Unchanged appearance of mild L1 superior endplate compression fracture from CT earlier today. No acute fracture of the pelvis. IMPRESSION: 1. Left basilar pneumothorax, increased from chest CT earlier today. Left lateral eighth and ninth rib fractures again seen. 2. No evidence of acute traumatic injury to the abdomen or pelvis allowing for limitations related to lack of IV contrast. 3. Unchanged appearance of mild L1 superior endplate compression fracture from CT earlier today. 4. Enlarged prostate gland causes mass effect on the bladder base. Aortic Atherosclerosis (ICD10-I70.0). Electronically Signed   By: Keith Rake M.D.   On: 02/17/2022 20:46   CT Head Wo Contrast  Result Date: 02/17/2022 CLINICAL DATA:  Fall. EXAM: CT HEAD  WITHOUT CONTRAST CT MAXILLOFACIAL WITHOUT CONTRAST CT CERVICAL SPINE WITHOUT CONTRAST TECHNIQUE: Multidetector CT imaging of the head, cervical spine, and maxillofacial structures were performed using the standard  protocol without intravenous contrast. Multiplanar CT image reconstructions of the cervical spine and maxillofacial structures were also generated. RADIATION DOSE REDUCTION: This exam was performed according to the departmental dose-optimization program which includes automated exposure control, adjustment of the mA and/or kV according to patient size and/or use of iterative reconstruction technique. COMPARISON:  MRI brain dated Aug 10, 2021. CT head dated May 07, 2021. CT cervical spine dated February 27, 2021. FINDINGS: CT HEAD FINDINGS Brain: No evidence of acute infarction, hemorrhage, hydrocephalus, extra-axial collection or mass lesion/mass effect. Stable mild atrophy and chronic microvascular ischemic changes. Vascular: Atherosclerotic vascular calcification of the carotid siphons. No hyperdense vessel. Skull: Normal. Negative for fracture or focal lesion. Other: None. CT MAXILLOFACIAL FINDINGS Osseous: No fracture or mandibular dislocation. No destructive process. Orbits: Negative. No traumatic or inflammatory finding. Sinuses: Tiny retention cysts in the left maxillary sinus. Remaining paranasal sinuses and mastoid air cells are clear. Soft tissues: Negative. CT CERVICAL SPINE FINDINGS Alignment: No traumatic malalignment. Skull base and vertebrae: No acute fracture. No primary bone lesion or focal pathologic process. Soft tissues and spinal canal: No prevertebral fluid or swelling. No visible canal hematoma. Disc levels: Moderate disc height loss and uncovertebral hypertrophy at C5-C6 and C6-C7. Moderate left facet arthropathy at C3-C4. Upper chest: Please see separate CT chest report from same day. Other: None. IMPRESSION: 1. No acute intracranial abnormality. 2. No acute facial fracture. 3. No acute cervical spine fracture or traumatic malalignment. Electronically Signed   By: Titus Dubin M.D.   On: 02/17/2022 19:19   CT Maxillofacial Wo Contrast  Result Date: 02/17/2022 CLINICAL DATA:  Fall.  EXAM: CT HEAD WITHOUT CONTRAST CT MAXILLOFACIAL WITHOUT CONTRAST CT CERVICAL SPINE WITHOUT CONTRAST TECHNIQUE: Multidetector CT imaging of the head, cervical spine, and maxillofacial structures were performed using the standard protocol without intravenous contrast. Multiplanar CT image reconstructions of the cervical spine and maxillofacial structures were also generated. RADIATION DOSE REDUCTION: This exam was performed according to the departmental dose-optimization program which includes automated exposure control, adjustment of the mA and/or kV according to patient size and/or use of iterative reconstruction technique. COMPARISON:  MRI brain dated Aug 10, 2021. CT head dated May 07, 2021. CT cervical spine dated February 27, 2021. FINDINGS: CT HEAD FINDINGS Brain: No evidence of acute infarction, hemorrhage, hydrocephalus, extra-axial collection or mass lesion/mass effect. Stable mild atrophy and chronic microvascular ischemic changes. Vascular: Atherosclerotic vascular calcification of the carotid siphons. No hyperdense vessel. Skull: Normal. Negative for fracture or focal lesion. Other: None. CT MAXILLOFACIAL FINDINGS Osseous: No fracture or mandibular dislocation. No destructive process. Orbits: Negative. No traumatic or inflammatory finding. Sinuses: Tiny retention cysts in the left maxillary sinus. Remaining paranasal sinuses and mastoid air cells are clear. Soft tissues: Negative. CT CERVICAL SPINE FINDINGS Alignment: No traumatic malalignment. Skull base and vertebrae: No acute fracture. No primary bone lesion or focal pathologic process. Soft tissues and spinal canal: No prevertebral fluid or swelling. No visible canal hematoma. Disc levels: Moderate disc height loss and uncovertebral hypertrophy at C5-C6 and C6-C7. Moderate left facet arthropathy at C3-C4. Upper chest: Please see separate CT chest report from same day. Other: None. IMPRESSION: 1. No acute intracranial abnormality. 2. No acute facial  fracture. 3. No acute cervical spine fracture or traumatic malalignment. Electronically Signed   By: Titus Dubin M.D.   On:  02/17/2022 19:19   CT Cervical Spine Wo Contrast  Result Date: 02/17/2022 CLINICAL DATA:  Fall. EXAM: CT HEAD WITHOUT CONTRAST CT MAXILLOFACIAL WITHOUT CONTRAST CT CERVICAL SPINE WITHOUT CONTRAST TECHNIQUE: Multidetector CT imaging of the head, cervical spine, and maxillofacial structures were performed using the standard protocol without intravenous contrast. Multiplanar CT image reconstructions of the cervical spine and maxillofacial structures were also generated. RADIATION DOSE REDUCTION: This exam was performed according to the departmental dose-optimization program which includes automated exposure control, adjustment of the mA and/or kV according to patient size and/or use of iterative reconstruction technique. COMPARISON:  MRI brain dated Aug 10, 2021. CT head dated May 07, 2021. CT cervical spine dated February 27, 2021. FINDINGS: CT HEAD FINDINGS Brain: No evidence of acute infarction, hemorrhage, hydrocephalus, extra-axial collection or mass lesion/mass effect. Stable mild atrophy and chronic microvascular ischemic changes. Vascular: Atherosclerotic vascular calcification of the carotid siphons. No hyperdense vessel. Skull: Normal. Negative for fracture or focal lesion. Other: None. CT MAXILLOFACIAL FINDINGS Osseous: No fracture or mandibular dislocation. No destructive process. Orbits: Negative. No traumatic or inflammatory finding. Sinuses: Tiny retention cysts in the left maxillary sinus. Remaining paranasal sinuses and mastoid air cells are clear. Soft tissues: Negative. CT CERVICAL SPINE FINDINGS Alignment: No traumatic malalignment. Skull base and vertebrae: No acute fracture. No primary bone lesion or focal pathologic process. Soft tissues and spinal canal: No prevertebral fluid or swelling. No visible canal hematoma. Disc levels: Moderate disc height loss and  uncovertebral hypertrophy at C5-C6 and C6-C7. Moderate left facet arthropathy at C3-C4. Upper chest: Please see separate CT chest report from same day. Other: None. IMPRESSION: 1. No acute intracranial abnormality. 2. No acute facial fracture. 3. No acute cervical spine fracture or traumatic malalignment. Electronically Signed   By: Titus Dubin M.D.   On: 02/17/2022 19:19   CT CHEST WO CONTRAST  Result Date: 02/17/2022 CLINICAL DATA:  Left-sided rib pain after a fall. EXAM: CT CHEST WITHOUT CONTRAST TECHNIQUE: Multidetector CT imaging of the chest was performed following the standard protocol without IV contrast. RADIATION DOSE REDUCTION: This exam was performed according to the departmental dose-optimization program which includes automated exposure control, adjustment of the mA and/or kV according to patient size and/or use of iterative reconstruction technique. COMPARISON:  Chest x-ray dated December 06, 2021. PET-CT dated October 09, 2021. FINDINGS: Cardiovascular: No significant vascular findings. Normal heart size. No pericardial effusion. Coronary, aortic arch, and branch vessel atherosclerotic vascular disease. Mediastinum/Nodes: No enlarged mediastinal or axillary lymph nodes. Thyroid gland, trachea, and esophagus demonstrate no significant findings. Lungs/Pleura: Small left pneumothorax. Mild subsegmental atelectasis in the posterior left lower lobe. Unchanged calcified granuloma along the left major fissure. The right lung is clear. No consolidation or pleural effusion. Upper Abdomen: No acute abnormality. Musculoskeletal: Acute nondisplaced fracture of the left lateral eighth rib. Acute minimally displaced fracture of the left posterolateral ninth rib. Subcutaneous emphysema in the left posterolateral chest wall extending into the lower neck. Minimal L1 superior endplate compression fracture, new since July. IMPRESSION: 1. Acute fractures of the left eighth and ninth ribs with small left  pneumothorax. 2. Minimal L1 superior endplate compression fracture, new since July. Correlate with point tenderness. 3.  Aortic atherosclerosis (ICD10-I70.0). Electronically Signed   By: Titus Dubin M.D.   On: 02/17/2022 19:08     Assessment and plan- Patient is a 71 y.o. male with R-ISS stage III high risk IgG lambda multiple myeloma.   He is here for on treatment assessment prior to cycle 7-day  1 of Velcade  Counts okay to proceed with cycle 7-day 1 of Velcade today.  He will directly proceed for cycle 7-day 8 of Velcade next week and I will see him back in 3 weeks for cycle 8.  He is continuing Revlimid at 10 mg 2 weeks on and 1 week off.  M protein is decreased over the last 6 months since initiation of treatment but is still detectable.  Serum free light chain ratio has now normalized and overall lambda free light chains are trending down.  If M protein levels off and does not decrease any further and if there is no further improvement in his free lambda light chain I will add Darzalex to his regimen in the near future.  Continue aspirin and acyclovir prophylaxis.  He has had multiple recent hospitalizations and I do not think that he would be a candidate for transplant down the line.  He is yet to go to [facility] despite our multiple attempts     Visit Diagnosis 1. Multiple myeloma not having achieved remission ([place])   2. Encounter for antineoplastic chemotherapy   3. High risk medication use      Dr. Randa Evens, MD, MPH [facility] at [facility] [phone] 03/12/2022 8:55 PM

## 2022-03-12 NOTE — Progress Notes (Addendum)
Initial Nutrition Assessment  DOCUMENTATION CODES:   Non-severe (moderate) malnutrition in context of chronic illness  INTERVENTION:   Glucerna Shake po TID, each supplement provides 220 kcal and 10 grams of protein   MVI po daily   Diabetes diet education   NUTRITION DIAGNOSIS:   Moderate Malnutrition related to chronic illness as evidenced by moderate fat depletion, moderate muscle depletion, severe muscle depletion.  GOAL:   Patient will meet greater than or equal to 90% of their needs  MONITOR:   PO intake, Supplement acceptance, Labs, Weight trends, Skin, I & O's  REASON FOR ASSESSMENT:   Malnutrition Screening Tool    ASSESSMENT:   71 y/o male with h/o IDDM, HTN, CKD III, PUD, substance abuse, CVA, multiple myeloma, HLD, lung nodule and recent traumatic pneumothorax with rib fractures after fall and who is admitted with hypoglycemia.  Met with pt in room today. Pt reports good appetite and oral intake pta and in hospital. Pt eating 100% of meals. Pt reports that he did not eat lunch today because his blood sugar was too high. Pt does report drinking all of his Glucerna but reports that he got diarrhea when he drank two at one time. RD provided pt with diabetes diet education today. RD also discussed the importance of adequate nutrition needed to preserve lean muscle. Per chart, pt is down 11lbs(7%) over the past month; this is significant weight loss. Recommend continue supplements and MVI. Pt to discharge home today.   RD provided "Nutrition and Type II Diabetes" handout from the Academy of [employer] and Dietetics. Discussed different food groups and their effects on blood sugar, emphasizing carbohydrate-containing foods. Provided list of carbohydrates and recommended serving sizes of common foods.  Discussed importance of controlled and consistent carbohydrate intake throughout the day. Provided examples of ways to balance meals/snacks and encouraged intake of  high-fiber, whole grain complex carbohydrates. Teach back method used.  Medications reviewed and include: aspirin, plavix, heparin, insulin, MVI, nicotine, thiamine, NaCl _0 /hr   Labs reviewed: Na 132(L), K 4.1 wnl, BUN 35(H), creat 1.38(H), P 3.3 wnl, Mg 1.9 wnl Hgb 8.0(L), Hct 24.8(L) Cbgs- 254, 350, 347 x 24 hrs AIC 9.1(H)- 12/11  NUTRITION - FOCUSED PHYSICAL EXAM:  Flowsheet Row Most Recent Value  Orbital Region Moderate depletion  Upper Arm Region Severe depletion  Thoracic and Lumbar Region Moderate depletion  Buccal Region Moderate depletion  Temple Region Moderate depletion  Clavicle Bone Region Severe depletion  Clavicle and Acromion Bone Region Severe depletion  Scapular Bone Region Severe depletion  Dorsal Hand Severe depletion  Patellar Region Severe depletion  Anterior Thigh Region Severe depletion  Posterior Calf Region Severe depletion  Edema (RD Assessment) None  Hair Reviewed  Eyes Reviewed  Mouth Reviewed  Skin Reviewed  Nails Reviewed   Diet Order:   Diet Order             Diet Carb Modified Fluid consistency: Thin; Room service appropriate? Yes  Diet effective now                  EDUCATION NEEDS:   Education needs have been addressed  Skin:  Skin Assessment: Reviewed RN Assessment (diabetic toe and leg wounds)  Last BM:  12/13  Height:   Ht Readings from Last 1 Encounters:  03/06/22 _1  (1.778 m)    Weight:   Wt Readings from Last 1 Encounters:  03/12/22 68.5 kg    Ideal Body Weight:  75.4 kg  BMI:  Body mass  index is 21.67 kg/m.  Estimated Nutritional Needs:   Kcal:  1900-2200kcal/day  Protein:  95-110g/day  Fluid:  1.7-2.0L/day  Koleen Distance MS, RD, LDN Please refer to [facility] for RD and/or RD on-call/weekend/after hours pager

## 2022-03-12 NOTE — Inpatient Diabetes Management (Addendum)
Inpatient Diabetes Program Recommendations  AACE/ADA: New Consensus Statement on Inpatient Glycemic Control (2015)  Target Ranges:  Prepandial:   less than 140 mg/dL      Peak postprandial:   less than 180 mg/dL (1-2 hours)      Critically ill patients:  140 - 180 mg/dL   Lab Results  Component Value Date   GLUCAP 350 (H) 03/12/2022   HGBA1C 9.1 (H) 03/09/2022    Review of Glycemic Control  Latest Reference Range & Units 03/11/22 08:33 03/11/22 11:30 03/11/22 15:52 03/11/22 21:39 03/12/22 06:43 03/12/22 08:20  Glucose-Capillary 70 - 99 mg/dL 137 (H) 375 (H) 185 (H) 117 (H) 347 (H) 350 (H)  (H): Data is abnormally high  Diabetes history: DM1(does not make insulin.  Needs correction, basal and meal coverage)   Outpatient Diabetes medications:  Tresiba 20 units QAM Novolog 0-14 units TID Farxiga 10 mg QD Current orders for Inpatient glycemic control:  Novolog 0-6 units TID, Novolog 2 units TID with meals ENDO-Dr. Gabriel Carina  Inpatient Diabetes Program Recommendations:    Semglee 15 units QD  Addendum'@1106'$ :  Reviewed his Dexcom trends from overnight.  He did not go below 80 mg/dl.    For DC please consider:  Tresiba 15 units QD Novolog 0-9 units TID F/U with Dr. Gabriel Carina  Will continue to follow while inpatient.  Thank you, Nicholas Dixon, MSN, [place] [facility] Coordinator Inpatient Diabetes Program [phone] (team pager from 8a-5p)

## 2022-03-12 NOTE — Plan of Care (Signed)
  Problem: Cardiac: Goal: Ability to maintain an adequate cardiac output will improve Outcome: Progressing   Problem: Health Behavior/Discharge Planning: Goal: Ability to identify and utilize available resources and services will improve Outcome: Progressing Goal: Ability to manage health-related needs will improve Outcome: Progressing   Problem: Metabolic: Goal: Ability to maintain appropriate glucose levels will improve Outcome: Progressing   Problem: Nutritional: Goal: Maintenance of adequate nutrition will improve Outcome: Progressing   Problem: Nutritional: Goal: Maintenance of adequate nutrition will improve Outcome: Progressing Goal: Progress toward achieving an optimal weight will improve Outcome: Progressing   Problem: Skin Integrity: Goal: Risk for impaired skin integrity will decrease Outcome: Progressing

## 2022-03-12 NOTE — Discharge Summary (Signed)
Physician Discharge Summary  KYPTON ELTRINGHAM LNL:[id number] DOB: 06-18-1950 DOA: 03/09/2022  PCP: Dion Body, MD  Admit date: 03/09/2022 Discharge date: 03/12/2022  Admitted From: home  Disposition:  home w/ home health   Recommendations for Outpatient Follow-up:  Follow up with PCP in 1-2 weeks F/u w/ endo, Dr. Gabriel Carina, Farmers: yes Equipment/Devices:  Discharge Condition: stable  CODE STATUS: full  Diet recommendation: Carb Modified  Brief/Interim Summary: HPI was taken from Dr. Florina Ou: Nicholas Weiss is an 71 y.o. male brought to the urgency room for Patient found to be hypoglycemic and has had similar episodes in the past previous A1c is above 8 and diabetes is poorly controlled. Will recheck A1c and tender subcu insulin regimen.  As per Dr. Jimmye Norman 12/13-12/14/23: Pt presented w/ symptomatic hypoglycemia. Pt still having intermittent hypoglycemic episodes on 12/13 but none on 03/12/22. Insulin regimen was adjusted as per DM coordinator recs. Pt will need to f/u w/ endo, Dr. Gabriel Carina, ASAP. Pt verbalized his understanding. For more information, please see previous progress/consult notes.    Discharge Diagnoses:  Principal Problem:   Hypoglycemia Active Problems:   Cocaine abuse (HCC)   Multiple myeloma (HCC)   Type 1 diabetes mellitus with diabetic neuropathy, unspecified (HCC)   Acute renal failure with acute tubular necrosis superimposed on stage 3a chronic kidney disease (HCC)   Essential (primary) hypertension   PUD (peptic ulcer disease)   Anemia of chronic kidney failure, stage 3 (moderate) (HCC)   Unresponsiveness   Smoldering multiple myeloma   History of CVA (cerebrovascular accident)  DM1 w/ hypoglycemia: hypoglycemic episodes have resolved. Continue on SSI w/ accuchecks. DM coordinator following. Recently lost weight but has MM. Has not seen outpatient endocrinologist in 3 months as per pt   Metabolic encephalopathy: likely secondary  to above. Mental status is back to baseline today    Hyperkalemia: lokelma x 1. Repeat K was WNL    Hx of recent traumatic pneumothorax: with left ribs 8 and 9 fractures and L1 compression fracture. Symptomatically improving. Breathing comfortably on room air satting in high 90s   Ulcers distal toes: w/ no signs of infection. Pulses not palpable. ABI was WNL    Hx CVA: continue on aspirin, plavix, statin    CKD IIIa: Cr is trending down daily     Multiple myeloma: holding revlimid. Management per onco outpatient    ACD: likely secondary to CKD. No need for a transfusion currently   Discharge Instructions  Discharge Instructions     Diet Carb Modified   Complete by: As directed    Discharge instructions   Complete by: As directed    F/u w/ PCP in 1-2 weeks. F/u w/ endocrinology, Dr. Gabriel Carina, as soon as possible   Increase activity slowly   Complete by: As directed    No wound care   Complete by: As directed       Allergies as of 03/12/2022       Reactions   Penicillins Other (See Comments)   Childhood allergy Unknown reaction Has patient had a PCN reaction causing immediate rash, facial/tongue/throat swelling, SOB or lightheadedness with hypotension: No Has patient had a PCN reaction causing severe rash involving mucus membranes or skin necrosis: No Has patient had a PCN reaction that required hospitalization No Has patient had a PCN reaction occurring within the last 10 years: No If all of the above answers are "NO", then may proceed with Cephalosporin use.  Medication List     TAKE these medications    acetaminophen 500 MG tablet Commonly known as: TYLENOL Take 1,000 mg by mouth daily as needed for mild pain or moderate pain.   acyclovir 400 MG tablet Commonly known as: ZOVIRAX Take 400 mg by mouth in the morning.   aspirin EC 81 MG tablet Take 81 mg by mouth in the morning.   atorvastatin 80 MG tablet Commonly known as: LIPITOR Take 1 tablet (80  mg total) by mouth at bedtime. What changed: when to take this   Baclofen 5 MG Tabs Take 5 mg by mouth daily as needed (muscle spasm).   clopidogrel 75 MG tablet Commonly known as: PLAVIX Take 75 mg by mouth in the morning.   dicyclomine 20 MG tablet Commonly known as: BENTYL Take 1 tablet (20 mg total) by mouth 3 (three) times daily with meals as needed for spasms.   docusate sodium 100 MG capsule Commonly known as: COLACE Take 100 mg by mouth daily as needed for mild constipation.   Farxiga 10 MG Tabs tablet Generic drug: dapagliflozin propanediol Take 10 mg by mouth in the morning.   gabapentin 300 MG capsule Commonly known as: NEURONTIN Take 300-600 mg by mouth See admin instructions. 300 mg in the morning, 600 mg at night.   lenalidomide 10 MG capsule Commonly known as: REVLIMID Take 1 capsule (10 mg total) by mouth daily. Take for 14 days, then hold for 7 days. Repeat every 21 days.   NovoLOG 100 UNIT/ML injection Generic drug: insulin aspart Inject 0-9 Units into the skin 3 (three) times daily with meals. What changed: See the new instructions.   ondansetron 8 MG tablet Commonly known as: ZOFRAN Take 8 mg by mouth daily as needed for nausea or vomiting.   prochlorperazine 10 MG tablet Commonly known as: COMPAZINE Take 10 mg by mouth daily as needed for nausea or vomiting.   [name] FlexTouch 100 UNIT/ML FlexTouch Pen Generic drug: insulin degludec Inject 15 Units into the skin daily. What changed:  how much to take when to take this        Allergies  Allergen Reactions   Penicillins Other (See Comments)    Childhood allergy Unknown reaction Has patient had a PCN reaction causing immediate rash, facial/tongue/throat swelling, SOB or lightheadedness with hypotension: No Has patient had a PCN reaction causing severe rash involving mucus membranes or skin necrosis: No Has patient had a PCN reaction that required hospitalization No Has patient had a PCN  reaction occurring within the last 10 years: No If all of the above answers are "NO", then may proceed with Cephalosporin use.     Consultations:    Procedures/Studies: US ARTERIAL ABI (SCREENING LOWER EXTREMITY)  Result Date: 03/11/2022 CLINICAL DATA:  Toe ulcers. EXAM: NONINVASIVE PHYSIOLOGIC VASCULAR STUDY OF BILATERAL LOWER EXTREMITIES TECHNIQUE: Evaluation of both lower extremities were performed at rest, including calculation of ankle-brachial indices with single level Doppler, pressure and pulse volume recording. COMPARISON:  None Available. FINDINGS: Right ABI:  1.16 Left ABI:  1.24 Right Lower Extremity:  Normal arterial waveforms at the ankle. Left Lower Extremity:  Normal arterial waveforms at the ankle. 1.0-1.4 Normal IMPRESSION: Normal ankle-brachial indices bilaterally. Electronically Signed   By: Markus Daft M.D.   On: 03/11/2022 16:47   MR BRAIN WO CONTRAST  Result Date: 03/10/2022 CLINICAL DATA:  Encephalopathy EXAM: MRI HEAD WITHOUT CONTRAST TECHNIQUE: Multiplanar, multiecho pulse sequences of the brain and surrounding structures were obtained without intravenous contrast. COMPARISON:  None Available. FINDINGS: Brain: No acute infarct, mass effect or extra-axial collection. Approximately 5 bilateral periventricular chronic microhemorrhages. No acute hemorrhage. There is multifocal hyperintense T2-weighted signal within the white matter. Parenchymal volume and CSF spaces are normal. The midline structures are normal. Vascular: Major flow voids are preserved. Skull and upper cervical spine: Normal calvarium and skull base. Visualized upper cervical spine and soft tissues are normal. Sinuses/Orbits:No paranasal sinus fluid levels or advanced mucosal thickening. No mastoid or middle ear effusion. Normal orbits. IMPRESSION: 1. No acute intracranial abnormality. 2. Findings of chronic small vessel ischemia. Electronically Signed   By: Ulyses Jarred M.D.   On: 03/10/2022 01:38   DG  Chest Port 1 View  Result Date: 02/24/2022 CLINICAL DATA:  Chest pain history of fall EXAM: PORTABLE CHEST 1 VIEW COMPARISON:  Chest radiograph 1 day prior FINDINGS: The heart is enlarged, unchanged. The upper mediastinal contours are stable. Aeration of the lung bases is improved compared to the study from 1 day prior. There is no new or worsening focal airspace disease. There is a probable trace left pleural effusion. There is no significant right effusion. There is no pneumothorax The IMPRESSION: 1. Improved aeration of the lung bases since the study from 1 day prior with a probable trace residual left pleural effusion. No appreciable pneumothorax. 2. Unchanged left ninth rib fracture and associated subcutaneous emphysema. Electronically Signed   By: Valetta Mole M.D.   On: 02/24/2022 10:55   DG CHEST PORT 1 VIEW  Result Date: 02/23/2022 CLINICAL DATA:  Follow-up left pneumothorax. EXAM: PORTABLE CHEST 1 VIEW COMPARISON:  02/22/2022.  CT, 02/17/2022. FINDINGS: No visualized pneumothorax. Left anterolateral and neck base subcutaneous emphysema is stable. Mildly displaced fracture of the left lateral ninth rib, also unchanged. Bilateral lung base opacities consistent with atelectasis, stable on the left, improved on the right the previous exam. Remainder of the lungs is clear. IMPRESSION: 1. No visualized residual left pneumothorax. 2. Improved right lung base atelectasis. No other change from the previous exam. Electronically Signed   By: Lajean Manes M.D.   On: 02/23/2022 10:10   DG CHEST PORT 1 VIEW  Result Date: 02/22/2022 CLINICAL DATA:  Pneumothorax. EXAM: PORTABLE CHEST 1 VIEW COMPARISON:  February 22, 2022. FINDINGS: The heart size and mediastinal contours are within normal limits. Left-sided chest tube has been removed. Minimal left apical pneumothorax is noted. Bibasilar atelectasis is noted. Stable subcutaneous emphysema is seen over left lateral chest wall and supraclavicular region. The  visualized skeletal structures are unremarkable. IMPRESSION: Minimal left apical pneumothorax is noted status post left-sided chest tube removal. Stable subcutaneous emphysema is noted. Mild bibasilar atelectasis is noted. Electronically Signed   By: Marijo Conception M.D.   On: 02/22/2022 12:24   DG CHEST PORT 1 VIEW  Result Date: 02/22/2022 CLINICAL DATA:  Chest tube EXAM: PORTABLE CHEST 1 VIEW COMPARISON:  02/21/2022 FINDINGS: Left chest tube remains in place without evidence for pneumothorax. Skin fold projects over the left lung apex. Subsegmental atelectasis noted right base. The cardiopericardial silhouette is within normal limits for size. Subcutaneous emphysema persists in the left chest wall. Telemetry leads overlie the chest. IMPRESSION: Left chest tube remains in place without evidence for pneumothorax. Electronically Signed   By: Misty Stanley M.D.   On: 02/22/2022 09:30   DG CHEST PORT 1 VIEW  Result Date: 02/21/2022 CLINICAL DATA:  Follow-up left-sided pneumothorax. EXAM: PORTABLE CHEST 1 VIEW COMPARISON:  Chest x-ray 02/20/2022 FINDINGS: The left sided chest tube is stable. No  pneumothorax. Stable subcutaneous emphysema. Persistent right lower lobe atelectasis. IMPRESSION: Stable left sided chest tube without pneumothorax. Stable subsegmental atelectasis at the right lung base. Electronically Signed   By: Marijo Sanes M.D.   On: 02/21/2022 11:29   DG CHEST PORT 1 VIEW  Result Date: 02/20/2022 CLINICAL DATA:  LEFT pneumothorax, follow-up EXAM: PORTABLE CHEST 1 VIEW COMPARISON:  Portable exam 0651 hours compared to 02/19/2022 FINDINGS: Pigtail LEFT thoracostomy tube unchanged. Upper normal heart size. Mediastinal contours and pulmonary vascularity normal. Atherosclerotic calcification aorta. Bibasilar atelectasis greater on RIGHT. Skin fold projects over LEFT chest. No acute infiltrate, pleural effusion, or pneumothorax. LEFT chest wall emphysema extending into LEFT cervical region.  IMPRESSION: Bibasilar atelectasis greater on RIGHT. LEFT thoracostomy tube without pneumothorax. Aortic Atherosclerosis (ICD10-I70.0). Electronically Signed   By: Lavonia Dana M.D.   On: 02/20/2022 08:38   DG CHEST PORT 1 VIEW  Result Date: 02/19/2022 CLINICAL DATA:  Trauma.  Rib fracture. EXAM: PORTABLE CHEST 1 VIEW COMPARISON:  02/18/2022 FINDINGS: Stable asymmetric elevation right hemidiaphragm with right base atelectasis. Left pleural drain again noted without definite evidence for left-sided pneumothorax. Subcutaneous emphysema noted left chest wall. Cardiopericardial silhouette is at upper limits of normal for size. Lower left rib fracture evident. IMPRESSION: 1. Left pleural drain without definite evidence for pneumothorax. Left heart border and left hemidiaphragm are well defined raising the question of anteromedial pleural air. 2. Stable asymmetric elevation right hemidiaphragm with right base atelectasis. Electronically Signed   By: Misty Stanley M.D.   On: 02/19/2022 08:48   DG Chest Portable 1 View  Result Date: 02/18/2022 CLINICAL DATA:  LEFT chest tube placement EXAM: PORTABLE CHEST 1 VIEW COMPARISON:  Portable exam 0944 hours compared to 0130 hours FINDINGS: Pigtail LEFT thoracostomy tube identified with significant decrease in previously seen LEFT pneumothorax. Small loculated pneumothorax at medial lower LEFT lung. Normal heart size, mediastinal contours, and pulmonary vascularity. Subsegmental atelectasis RIGHT base, mildly in retrocardiac LEFT lower lobe. No pleural effusion or infiltrate seen. Atherosclerotic calcification aorta. LEFT chest wall emphysema extending into cervical region. Displaced fracture of the posterolateral LEFT ninth rib noted. IMPRESSION: Significant decrease in LEFT pneumothorax following chest tube insertion. Bibasilar atelectasis. Displaced fracture posterolateral LEFT ninth rib. Electronically Signed   By: Lavonia Dana M.D.   On: 02/18/2022 09:56   DG CHEST  PORT 1 VIEW  Result Date: 02/18/2022 CLINICAL DATA:  Left pneumothorax. EXAM: PORTABLE CHEST 1 VIEW COMPARISON:  02/17/2022. FINDINGS: The heart size and mediastinal contours are stable. There is atherosclerotic calcification of the aorta. There is redemonstration of a left pneumothorax of approximately 23%, slightly increased from the prior exam. Subcutaneous emphysema is noted in the supraclavicular, cervical region, axilla, and lateral chest wall on the left. The right lung is clear. The previously described left rib fractures are not well seen on this exam. IMPRESSION: 1. Slightly increased pneumothorax on the left of approximately 23%. 2. Stable subcutaneous emphysema. Electronically Signed   By: Brett Fairy M.D.   On: 02/18/2022 01:43   DG Chest Portable 1 View  Result Date: 02/17/2022 CLINICAL DATA:  Evaluate pneumothorax.  Fall. EXAM: PORTABLE CHEST 1 VIEW COMPARISON:  Chest x-ray 12/06/2021.  Chest CT 02/17/2022. FINDINGS: Small left pneumothorax is present (proximally 20%). This is stable to minimally increased compared to the prior CT given differences in technique. Left chest wall emphysema is again seen. There is no evidence for mediastinal shift or focal lung infiltrate. The cardiomediastinal silhouette is within normal limits. Osseous structures appear stable. IMPRESSION:  1. Small left pneumothorax, stable to minimally increased compared to the prior CT given differences in technique. 2. Left chest wall emphysema. Electronically Signed   By: Ronney Asters M.D.   On: 02/17/2022 21:35   CT ABDOMEN PELVIS WO CONTRAST  Result Date: 02/17/2022 CLINICAL DATA:  Blunt abdominal trauma. Fall getting out of bed. Additional falls in the kitchen. EXAM: CT ABDOMEN AND PELVIS WITHOUT CONTRAST TECHNIQUE: Multidetector CT imaging of the abdomen and pelvis was performed following the standard protocol without IV contrast. RADIATION DOSE REDUCTION: This exam was performed according to the departmental  dose-optimization program which includes automated exposure control, adjustment of the mA and/or kV according to patient size and/or use of iterative reconstruction technique. COMPARISON:  Included portions from chest CT earlier today. Abdominopelvic CT 08/10/2021 FINDINGS: Lower chest: Left basilar pneumothorax, increased from chest CT earlier today. Again seen subcutaneous emphysema in the left chest wall. Left lateral eighth and ninth rib fractures again seen. Hepatobiliary: Lack of contrast limits detailed assessment, allowing for this there is no evidence of injury. Gallbladder physiologically distended, no calcified stone. No biliary dilatation. Pancreas: Unremarkable unenhanced appearance, no evidence of injury. Spleen: Lack of IV contrast limits detailed assessment. Allowing for this, no evidence of splenic injury. No perisplenic free fluid. Adrenals/Urinary Tract: No adrenal hemorrhage or renal injury identified. Bladder is unremarkable. Stomach/Bowel: No obvious bowel injury on this unenhanced exam. There is mild fluid distention of the duodenum. Scattered fluid within nondilated small bowel. No definite bowel wall thickening or inflammation. Moderate to large colonic stool burden. Normal appendix visualized. Vascular/Lymphatic: Advanced aortic atherosclerosis. There is no perivascular stranding or retroperitoneal fluid to suggest injury. No bulky abdominopelvic adenopathy. Reproductive: Enlarged prostate gland causes mass effect on the bladder base. Other: No free air or free fluid. Musculoskeletal: Unchanged appearance of mild L1 superior endplate compression fracture from CT earlier today. No acute fracture of the pelvis. IMPRESSION: 1. Left basilar pneumothorax, increased from chest CT earlier today. Left lateral eighth and ninth rib fractures again seen. 2. No evidence of acute traumatic injury to the abdomen or pelvis allowing for limitations related to lack of IV contrast. 3. Unchanged appearance of  mild L1 superior endplate compression fracture from CT earlier today. 4. Enlarged prostate gland causes mass effect on the bladder base. Aortic Atherosclerosis (ICD10-I70.0). Electronically Signed   By: Keith Rake M.D.   On: 02/17/2022 20:46   CT Head Wo Contrast  Result Date: 02/17/2022 CLINICAL DATA:  Fall. EXAM: CT HEAD WITHOUT CONTRAST CT MAXILLOFACIAL WITHOUT CONTRAST CT CERVICAL SPINE WITHOUT CONTRAST TECHNIQUE: Multidetector CT imaging of the head, cervical spine, and maxillofacial structures were performed using the standard protocol without intravenous contrast. Multiplanar CT image reconstructions of the cervical spine and maxillofacial structures were also generated. RADIATION DOSE REDUCTION: This exam was performed according to the departmental dose-optimization program which includes automated exposure control, adjustment of the mA and/or kV according to patient size and/or use of iterative reconstruction technique. COMPARISON:  MRI brain dated Aug 10, 2021. CT head dated May 07, 2021. CT cervical spine dated February 27, 2021. FINDINGS: CT HEAD FINDINGS Brain: No evidence of acute infarction, hemorrhage, hydrocephalus, extra-axial collection or mass lesion/mass effect. Stable mild atrophy and chronic microvascular ischemic changes. Vascular: Atherosclerotic vascular calcification of the carotid siphons. No hyperdense vessel. Skull: Normal. Negative for fracture or focal lesion. Other: None. CT MAXILLOFACIAL FINDINGS Osseous: No fracture or mandibular dislocation. No destructive process. Orbits: Negative. No traumatic or inflammatory finding. Sinuses: Tiny retention cysts in the  left maxillary sinus. Remaining paranasal sinuses and mastoid air cells are clear. Soft tissues: Negative. CT CERVICAL SPINE FINDINGS Alignment: No traumatic malalignment. Skull base and vertebrae: No acute fracture. No primary bone lesion or focal pathologic process. Soft tissues and spinal canal: No prevertebral  fluid or swelling. No visible canal hematoma. Disc levels: Moderate disc height loss and uncovertebral hypertrophy at C5-C6 and C6-C7. Moderate left facet arthropathy at C3-C4. Upper chest: Please see separate CT chest report from same day. Other: None. IMPRESSION: 1. No acute intracranial abnormality. 2. No acute facial fracture. 3. No acute cervical spine fracture or traumatic malalignment. Electronically Signed   By: Titus Dubin M.D.   On: 02/17/2022 19:19   CT Maxillofacial Wo Contrast  Result Date: 02/17/2022 CLINICAL DATA:  Fall. EXAM: CT HEAD WITHOUT CONTRAST CT MAXILLOFACIAL WITHOUT CONTRAST CT CERVICAL SPINE WITHOUT CONTRAST TECHNIQUE: Multidetector CT imaging of the head, cervical spine, and maxillofacial structures were performed using the standard protocol without intravenous contrast. Multiplanar CT image reconstructions of the cervical spine and maxillofacial structures were also generated. RADIATION DOSE REDUCTION: This exam was performed according to the departmental dose-optimization program which includes automated exposure control, adjustment of the mA and/or kV according to patient size and/or use of iterative reconstruction technique. COMPARISON:  MRI brain dated Aug 10, 2021. CT head dated May 07, 2021. CT cervical spine dated February 27, 2021. FINDINGS: CT HEAD FINDINGS Brain: No evidence of acute infarction, hemorrhage, hydrocephalus, extra-axial collection or mass lesion/mass effect. Stable mild atrophy and chronic microvascular ischemic changes. Vascular: Atherosclerotic vascular calcification of the carotid siphons. No hyperdense vessel. Skull: Normal. Negative for fracture or focal lesion. Other: None. CT MAXILLOFACIAL FINDINGS Osseous: No fracture or mandibular dislocation. No destructive process. Orbits: Negative. No traumatic or inflammatory finding. Sinuses: Tiny retention cysts in the left maxillary sinus. Remaining paranasal sinuses and mastoid air cells are clear. Soft  tissues: Negative. CT CERVICAL SPINE FINDINGS Alignment: No traumatic malalignment. Skull base and vertebrae: No acute fracture. No primary bone lesion or focal pathologic process. Soft tissues and spinal canal: No prevertebral fluid or swelling. No visible canal hematoma. Disc levels: Moderate disc height loss and uncovertebral hypertrophy at C5-C6 and C6-C7. Moderate left facet arthropathy at C3-C4. Upper chest: Please see separate CT chest report from same day. Other: None. IMPRESSION: 1. No acute intracranial abnormality. 2. No acute facial fracture. 3. No acute cervical spine fracture or traumatic malalignment. Electronically Signed   By: Titus Dubin M.D.   On: 02/17/2022 19:19   CT Cervical Spine Wo Contrast  Result Date: 02/17/2022 CLINICAL DATA:  Fall. EXAM: CT HEAD WITHOUT CONTRAST CT MAXILLOFACIAL WITHOUT CONTRAST CT CERVICAL SPINE WITHOUT CONTRAST TECHNIQUE: Multidetector CT imaging of the head, cervical spine, and maxillofacial structures were performed using the standard protocol without intravenous contrast. Multiplanar CT image reconstructions of the cervical spine and maxillofacial structures were also generated. RADIATION DOSE REDUCTION: This exam was performed according to the departmental dose-optimization program which includes automated exposure control, adjustment of the mA and/or kV according to patient size and/or use of iterative reconstruction technique. COMPARISON:  MRI brain dated Aug 10, 2021. CT head dated May 07, 2021. CT cervical spine dated February 27, 2021. FINDINGS: CT HEAD FINDINGS Brain: No evidence of acute infarction, hemorrhage, hydrocephalus, extra-axial collection or mass lesion/mass effect. Stable mild atrophy and chronic microvascular ischemic changes. Vascular: Atherosclerotic vascular calcification of the carotid siphons. No hyperdense vessel. Skull: Normal. Negative for fracture or focal lesion. Other: None. CT MAXILLOFACIAL FINDINGS Osseous: No fracture  or  mandibular dislocation. No destructive process. Orbits: Negative. No traumatic or inflammatory finding. Sinuses: Tiny retention cysts in the left maxillary sinus. Remaining paranasal sinuses and mastoid air cells are clear. Soft tissues: Negative. CT CERVICAL SPINE FINDINGS Alignment: No traumatic malalignment. Skull base and vertebrae: No acute fracture. No primary bone lesion or focal pathologic process. Soft tissues and spinal canal: No prevertebral fluid or swelling. No visible canal hematoma. Disc levels: Moderate disc height loss and uncovertebral hypertrophy at C5-C6 and C6-C7. Moderate left facet arthropathy at C3-C4. Upper chest: Please see separate CT chest report from same day. Other: None. IMPRESSION: 1. No acute intracranial abnormality. 2. No acute facial fracture. 3. No acute cervical spine fracture or traumatic malalignment. Electronically Signed   By: Titus Dubin M.D.   On: 02/17/2022 19:19   CT CHEST WO CONTRAST  Result Date: 02/17/2022 CLINICAL DATA:  Left-sided rib pain after a fall. EXAM: CT CHEST WITHOUT CONTRAST TECHNIQUE: Multidetector CT imaging of the chest was performed following the standard protocol without IV contrast. RADIATION DOSE REDUCTION: This exam was performed according to the departmental dose-optimization program which includes automated exposure control, adjustment of the mA and/or kV according to patient size and/or use of iterative reconstruction technique. COMPARISON:  Chest x-ray dated December 06, 2021. PET-CT dated October 09, 2021. FINDINGS: Cardiovascular: No significant vascular findings. Normal heart size. No pericardial effusion. Coronary, aortic arch, and branch vessel atherosclerotic vascular disease. Mediastinum/Nodes: No enlarged mediastinal or axillary lymph nodes. Thyroid gland, trachea, and esophagus demonstrate no significant findings. Lungs/Pleura: Small left pneumothorax. Mild subsegmental atelectasis in the posterior left lower lobe. Unchanged  calcified granuloma along the left major fissure. The right lung is clear. No consolidation or pleural effusion. Upper Abdomen: No acute abnormality. Musculoskeletal: Acute nondisplaced fracture of the left lateral eighth rib. Acute minimally displaced fracture of the left posterolateral ninth rib. Subcutaneous emphysema in the left posterolateral chest wall extending into the lower neck. Minimal L1 superior endplate compression fracture, new since July. IMPRESSION: 1. Acute fractures of the left eighth and ninth ribs with small left pneumothorax. 2. Minimal L1 superior endplate compression fracture, new since July. Correlate with point tenderness. 3.  Aortic atherosclerosis (ICD10-I70.0). Electronically Signed   By: Titus Dubin M.D.   On: 02/17/2022 19:08   (Echo, Carotid, EGD, Colonoscopy, ERCP)    Subjective: Pt denies any complaints    Discharge Exam: Vitals:   03/12/22 0822 03/12/22 1605  BP: (!) 140/86 118/68  Pulse: 83 63  Resp: 18 18  Temp: 99 F (37.2 C) 98.3 F (36.8 C)  SpO2: 100% 100%   Vitals:   03/12/22 0352 03/12/22 0516 03/12/22 0822 03/12/22 1605  BP:  (!) 154/75 (!) 140/86 118/68  Pulse:  75 83 63  Resp:   18 18  Temp:   99 F (37.2 C) 98.3 F (36.8 C)  TempSrc:   Oral Oral  SpO2:   100% 100%  Weight: 68.5 kg       General: Pt is alert, awake, not in acute distress Cardiovascular: S1/S2 +, no rubs, no gallops Respiratory: CTA bilaterally, no wheezing, no rhonchi Abdominal: Soft, NT, ND, bowel sounds + Extremities: no edema, no cyanosis    The results of significant diagnostics from this hospitalization (including imaging, microbiology, ancillary and laboratory) are listed below for reference.     Microbiology: No results found for this or any previous visit (from the past 240 hour(s)).   Labs: BNP (last 3 results) No results for input(s): "BNP" in  the last 8760 hours. Basic Metabolic Panel: Recent Labs  Lab 03/10/22 0355 03/10/22 1144  03/10/22 2011 03/11/22 0245 03/12/22 0537 03/12/22 1452  NA 134* 135 135 139 132*  --   K 5.9* 5.4* 5.4* 4.8 5.6* 4.1  CL 104 107 104 110 107  --   CO2 _0 20*  --   GLUCOSE 235* 117* 257* 35* 368*  --   BUN 39* 34* 37* 33* 35*  --   CREATININE 1.60* 1.37* 1.55* 1.50* 1.38*  --   CALCIUM 7.9* 8.2* 8.0* 7.9* 7.7*  --   MG  --   --   --   --  1.9  --   PHOS  --   --   --   --  3.3  --    Liver Function Tests: Recent Labs  Lab 03/06/22 1027 03/09/22 2155 03/10/22 0355  AST 42* 25 22  ALT 60* 44 39  ALKPHOS 89 90 83  BILITOT 0.4 0.4 0.4  PROT 7.8 7.7 7.2  ALBUMIN 2.9* 3.0* 2.7*   No results for input(s): "LIPASE", "AMYLASE" in the last 168 hours. No results for input(s): "AMMONIA" in the last 168 hours. CBC: Recent Labs  Lab 03/06/22 1027 03/09/22 2155 03/10/22 0355 03/12/22 0537  WBC 7.0 7.4 6.8 5.7  NEUTROABS 5.1  --   --   --   HGB 8.7* 8.8* 8.9* 8.0*  HCT 27.3* 28.5* 28.8* 24.8*  MCV 94.5 96.6 97.0 93.2  PLT 502* 494* 499* 411*   Cardiac Enzymes: Recent Labs  Lab 03/09/22 2155  CKTOTAL 47*   BNP: Invalid input(s): "POCBNP" CBG: Recent Labs  Lab 03/11/22 2139 03/12/22 0643 03/12/22 0820 03/12/22 1132 03/12/22 1631  GLUCAP 117* 347* 350* 254*  254* 125*   D-Dimer No results for input(s): "DDIMER" in the last 72 hours. Hgb A1c Recent Labs    03/09/22 2155  HGBA1C 9.1*   Lipid Profile No results for input(s): "CHOL", "HDL", "LDLCALC", "TRIG", "CHOLHDL", "LDLDIRECT" in the last 72 hours. Thyroid function studies Recent Labs    03/09/22 2155  TSH 1.757   Anemia work up No results for input(s): "VITAMINB12", "FOLATE", "FERRITIN", "TIBC", "IRON", "RETICCTPCT" in the last 72 hours. Urinalysis    Component Value Date/Time   COLORURINE YELLOW (A) 02/17/2022 2240   APPEARANCEUR CLOUDY (A) 02/17/2022 2240   LABSPEC 1.015 02/17/2022 2240   PHURINE 7.0 02/17/2022 2240   GLUCOSEU >=500 (A) 02/17/2022 2240   HGBUR MODERATE (A)  02/17/2022 2240   BILIRUBINUR NEGATIVE 02/17/2022 2240   KETONESUR NEGATIVE 02/17/2022 2240   PROTEINUR 100 (A) 02/17/2022 2240   NITRITE NEGATIVE 02/17/2022 2240   LEUKOCYTESUR SMALL (A) 02/17/2022 2240   Sepsis Labs Recent Labs  Lab 03/06/22 1027 03/09/22 2155 03/10/22 0355 03/12/22 0537  WBC 7.0 7.4 6.8 5.7   Microbiology No results found for this or any previous visit (from the past 240 hour(s)).   Time coordinating discharge: Over 30 minutes  SIGNED:   Wyvonnia Dusky, MD  [facility] 03/12/2022, 4:33 PM Pager   If 7PM-7AM, please contact night-coverage [email or web address]

## 2022-03-12 NOTE — Care Management Important Message (Signed)
Important Message  Patient Details  Name: Nicholas Weiss MRN: [id number] Date of Birth: 1950-07-07   Medicare Important Message Given:  N/A - LOS <3 / Initial given by admissions     [name] 03/12/2022, 5:11 PM

## 2022-03-13 ENCOUNTER — Telehealth: Payer: Self-pay | Admitting: Pharmacist

## 2022-03-13 ENCOUNTER — Inpatient Hospital Stay: Payer: Medicare Other

## 2022-03-13 ENCOUNTER — Ambulatory Visit: Payer: Medicare Other

## 2022-03-13 VITALS — BP 149/83 | HR 75 | Temp 96.8°F | Resp 18

## 2022-03-13 DIAGNOSIS — C9000 Multiple myeloma not having achieved remission: Secondary | ICD-10-CM

## 2022-03-13 DIAGNOSIS — N183 Chronic kidney disease, stage 3 unspecified: Secondary | ICD-10-CM

## 2022-03-13 LAB — COMPREHENSIVE METABOLIC PANEL
ALT: 33 U/L (ref 0–44)
AST: 25 U/L (ref 15–41)
Albumin: 2.7 g/dL — ABNORMAL LOW (ref 3.5–5.0)
Alkaline Phosphatase: 78 U/L (ref 38–126)
Anion gap: 9 (ref 5–15)
BUN: 35 mg/dL — ABNORMAL HIGH (ref 8–23)
CO2: 23 mmol/L (ref 22–32)
Calcium: 8 mg/dL — ABNORMAL LOW (ref 8.9–10.3)
Chloride: 105 mmol/L (ref 98–111)
Creatinine, Ser: 1.46 mg/dL — ABNORMAL HIGH (ref 0.61–1.24)
GFR, Estimated: 52 mL/min — ABNORMAL LOW (ref >60)
Glucose, Bld: 188 mg/dL — ABNORMAL HIGH (ref 70–99)
Potassium: 4.3 mmol/L (ref 3.5–5.1)
Sodium: 137 mmol/L (ref 135–145)
Total Bilirubin: 0.5 mg/dL (ref 0.3–1.2)
Total Protein: 6.9 g/dL (ref 6.5–8.1)

## 2022-03-13 LAB — CBC WITH DIFFERENTIAL/PLATELET
Abs Immature Granulocytes: 0.02 10*3/uL (ref 0.00–0.07)
Basophils Absolute: 0 10*3/uL (ref 0.0–0.1)
Basophils Relative: 0 %
Eosinophils Absolute: 0.1 10*3/uL (ref 0.0–0.5)
Eosinophils Relative: 2 %
HCT: 24.2 % — ABNORMAL LOW (ref 39.0–52.0)
Hemoglobin: 7.7 g/dL — ABNORMAL LOW (ref 13.0–17.0)
Immature Granulocytes: 0 %
Lymphocytes Relative: 17 %
Lymphs Abs: 1.2 10*3/uL (ref 0.7–4.0)
MCH: 30 pg (ref 26.0–34.0)
MCHC: 31.8 g/dL (ref 30.0–36.0)
MCV: 94.2 fL (ref 80.0–100.0)
Monocytes Absolute: 0.5 10*3/uL (ref 0.1–1.0)
Monocytes Relative: 7 %
Neutro Abs: 5 10*3/uL (ref 1.7–7.7)
Neutrophils Relative %: 74 %
Platelets: 401 10*3/uL — ABNORMAL HIGH (ref 150–400)
RBC: 2.57 MIL/uL — ABNORMAL LOW (ref 4.22–5.81)
RDW: 15.3 % (ref 11.5–15.5)
WBC: 6.8 10*3/uL (ref 4.0–10.5)
nRBC: 0 % (ref 0.0–0.2)

## 2022-03-13 MED ORDER — BORTEZOMIB CHEMO SQ INJECTION 3.5 MG (2.5MG/ML)
1.3000 mg/m2 | Freq: Once | INTRAMUSCULAR | Status: AC
  Administered 2022-03-13: 2.5 mg via SUBCUTANEOUS
  Filled 2022-03-13: qty 1

## 2022-03-13 MED ORDER — DEXAMETHASONE 4 MG PO TABS
40.0000 mg | ORAL_TABLET | Freq: Once | ORAL | Status: AC
  Administered 2022-03-13: 40 mg via ORAL
  Filled 2022-03-13: qty 10

## 2022-03-13 MED ORDER — EPOETIN ALFA-EPBX 40000 UNIT/ML IJ SOLN
40000.0000 [IU] | INTRAMUSCULAR | Status: DC
  Administered 2022-03-13: 40000 [IU] via SUBCUTANEOUS
  Filled 2022-03-13: qty 1

## 2022-03-13 NOTE — Patient Instructions (Signed)
[facility]  Discharge Instructions: Thank you for choosing [facility] to provide your oncology and hematology care.  If you have a lab appointment with the Cancer Center, please go directly to the Cancer Center and check in at the registration area.  Wear comfortable clothing and clothing appropriate for easy access to any Portacath or PICC line.   We strive to give you quality time with your provider. You may need to reschedule your appointment if you arrive late (15 or more minutes).  Arriving late affects you and other patients whose appointments are after yours.  Also, if you miss three or more appointments without notifying the office, you may be dismissed from the clinic at the provider's discretion.      For prescription refill requests, have your pharmacy contact our office and allow 72 hours for refills to be completed.    Today you received the following chemotherapy and/or immunotherapy agents Velcade      To help prevent nausea and vomiting after your treatment, we encourage you to take your nausea medication as directed.  BELOW ARE SYMPTOMS THAT SHOULD BE REPORTED IMMEDIATELY: *FEVER GREATER THAN 100.4 F (38 C) OR HIGHER *CHILLS OR SWEATING *NAUSEA AND VOMITING THAT IS NOT CONTROLLED WITH YOUR NAUSEA MEDICATION *UNUSUAL SHORTNESS OF BREATH *UNUSUAL BRUISING OR BLEEDING *URINARY PROBLEMS (pain or burning when urinating, or frequent urination) *BOWEL PROBLEMS (unusual diarrhea, constipation, pain near the anus) TENDERNESS IN MOUTH AND THROAT WITH OR WITHOUT PRESENCE OF ULCERS (sore throat, sores in mouth, or a toothache) UNUSUAL RASH, SWELLING OR PAIN  UNUSUAL VAGINAL DISCHARGE OR ITCHING   Items with * indicate a potential emergency and should be followed up as soon as possible or go to the Emergency Department if any problems should occur.  Please show the CHEMOTHERAPY ALERT CARD or IMMUNOTHERAPY ALERT CARD at check-in to the  Emergency Department and triage nurse.  Should you have questions after your visit or need to cancel or reschedule your appointment, please contact [facility]  [phone] and follow the prompts.  Office hours are 8:00 a.m. to 4:30 p.m. Monday - Friday. Please note that voicemails left after 4:00 p.m. may not be returned until the following business day.  We are closed weekends and major holidays. You have access to a nurse at all times for urgent questions. Please call the main number to the clinic [phone] and follow the prompts.  For any non-urgent questions, you may also contact your provider using MyChart. We now offer e-Visits for anyone 18 and older to request care online for non-urgent symptoms. For details visit [email or web address].   Also download the MyChart app! Go to the app store, search "MyChart", open the app, select [facility], and log in with your MyChart username and password.  Masks are optional in the cancer centers. If you would like for your care team to wear a mask while they are taking care of you, please let them know. For doctor visits, patients may have with them one support person who is at least 69 years old. At this time, visitors are not allowed in the infusion area.   

## 2022-03-13 NOTE — Telephone Encounter (Signed)
Oral Chemotherapy Pharmacist Encounter   Notified patient was discharged from hospital on 03/12/22 and had not not taken any Revlmid since 03/07/22 (cycle started 03/06/22). Confirmed with Dr. Janese Banks she would like patient to resume Revlimid today (day 8) and take through planned day 14 of current cycle (03/19/22), with off week being 03/20/22-03/26/22.  RN gave updated calendar to patient in infusion. I have left voicemail for patient to call back and confirm that he will resume Revlimid tonight.   Leron Croak, PharmD, BCPS, BCOP Hematology/Oncology Clinical Pharmacist [facility] [phone] 03/13/2022 3:24 PM

## 2022-03-20 ENCOUNTER — Inpatient Hospital Stay: Payer: Medicare Other

## 2022-03-20 ENCOUNTER — Encounter: Payer: Self-pay | Admitting: Oncology

## 2022-03-20 ENCOUNTER — Inpatient Hospital Stay (HOSPITAL_BASED_OUTPATIENT_CLINIC_OR_DEPARTMENT_OTHER): Payer: Medicare Other | Admitting: Oncology

## 2022-03-20 VITALS — BP 125/79 | HR 73 | Temp 96.5°F | Resp 16 | Ht 70.0 in | Wt 162.0 lb

## 2022-03-20 DIAGNOSIS — C9000 Multiple myeloma not having achieved remission: Secondary | ICD-10-CM | POA: Diagnosis not present

## 2022-03-20 DIAGNOSIS — Z79899 Other long term (current) drug therapy: Secondary | ICD-10-CM

## 2022-03-20 DIAGNOSIS — Z5111 Encounter for antineoplastic chemotherapy: Secondary | ICD-10-CM | POA: Diagnosis not present

## 2022-03-20 LAB — CBC WITH DIFFERENTIAL/PLATELET
Abs Immature Granulocytes: 0.04 10*3/uL (ref 0.00–0.07)
Basophils Absolute: 0 10*3/uL (ref 0.0–0.1)
Basophils Relative: 0 %
Eosinophils Absolute: 0.3 10*3/uL (ref 0.0–0.5)
Eosinophils Relative: 5 %
HCT: 22.4 % — ABNORMAL LOW (ref 39.0–52.0)
Hemoglobin: 7.1 g/dL — ABNORMAL LOW (ref 13.0–17.0)
Immature Granulocytes: 1 %
Lymphocytes Relative: 25 %
Lymphs Abs: 1.6 10*3/uL (ref 0.7–4.0)
MCH: 30.6 pg (ref 26.0–34.0)
MCHC: 31.7 g/dL (ref 30.0–36.0)
MCV: 96.6 fL (ref 80.0–100.0)
Monocytes Absolute: 1 10*3/uL (ref 0.1–1.0)
Monocytes Relative: 15 %
Neutro Abs: 3.5 10*3/uL (ref 1.7–7.7)
Neutrophils Relative %: 54 %
Platelets: 301 10*3/uL (ref 150–400)
RBC: 2.32 MIL/uL — ABNORMAL LOW (ref 4.22–5.81)
RDW: 16.4 % — ABNORMAL HIGH (ref 11.5–15.5)
WBC: 6.5 10*3/uL (ref 4.0–10.5)
nRBC: 0 % (ref 0.0–0.2)

## 2022-03-20 LAB — COMPREHENSIVE METABOLIC PANEL
ALT: 22 U/L (ref 0–44)
AST: 17 U/L (ref 15–41)
Albumin: 2.7 g/dL — ABNORMAL LOW (ref 3.5–5.0)
Alkaline Phosphatase: 71 U/L (ref 38–126)
Anion gap: 8 (ref 5–15)
BUN: 29 mg/dL — ABNORMAL HIGH (ref 8–23)
CO2: 23 mmol/L (ref 22–32)
Calcium: 7.7 mg/dL — ABNORMAL LOW (ref 8.9–10.3)
Chloride: 107 mmol/L (ref 98–111)
Creatinine, Ser: 1.38 mg/dL — ABNORMAL HIGH (ref 0.61–1.24)
GFR, Estimated: 56 mL/min — ABNORMAL LOW (ref >60)
Glucose, Bld: 141 mg/dL — ABNORMAL HIGH (ref 70–99)
Potassium: 4.8 mmol/L (ref 3.5–5.1)
Sodium: 138 mmol/L (ref 135–145)
Total Bilirubin: 0.3 mg/dL (ref 0.3–1.2)
Total Protein: 6.4 g/dL — ABNORMAL LOW (ref 6.5–8.1)

## 2022-03-20 NOTE — Progress Notes (Signed)
Mentioned blood sugars running on the lower end. States he had an instance at the beginning of the week at the dentist where he passed out due to his sugar reading 39. PCP has been adjusting medications to help with this issue.

## 2022-03-20 NOTE — Addendum Note (Signed)
Addended by: Philipp Deputy on: 03/20/2022 01:30 PM   Modules accepted: Orders

## 2022-03-20 NOTE — Progress Notes (Signed)
Hematology/Oncology Consult note [facility]  Telephone:([phone] [phone]  Patient Care Team: Dion Body, MD as PCP - General (Family Medicine) Pa, [place] (Optometry) Sindy Guadeloupe, MD as Consulting Physician (Oncology)   Name of the patient: Nicholas Weiss  [id number]  08/03/1950   Date of visit: 03/20/22  Diagnosis- high risk IgG lambda multiple myeloma R-ISS stage III   Chief complaint/ Reason for visit-on treatment assessment prior to cycle 7-day 15 of Velcade  Heme/Onc history: Patient is a 71 year old African-American male with a past medical history significant for uncontrolled type 1 diabetes, chronic kidney disease stage III chronic normocytic anemia referred for abnormal SPEP. Patient's creatinine has been fluctuating between 1.5-2 but about 5 months ago it went up all the way to 5 and his blood sugars were in the 1000 range. More recently his kidney numbers have been drifting back to normal values. As a part of the work-up he had serum protein electrophoresis done which showed an elevated gammaglobulin fraction with an M spike of 3%. The amount of M spike was not quantified in the specimen. He has therefore been referred to [place] for further management. Patient endorses chronic fatigue reports that his appetite and weight have remained stable   Results of blood work from 04/08/2020 were as follows: CBC showed white count of 2.9, H&H of 10.5/31.2 with an MCV of 91 and a platelet count of 191.  CMP showed a mildly elevated creatinine of 1.2 which was better as compared to 5 months ago when it was 3.9.  Total protein was mildly elevated at 9.1 calcium normal at 8.4 ferritin and iron studies B12 folate TSH and haptoglobin were normal.  Myeloma panel revealed an elevated IgG level of 06/04/2004 with an M protein of 3.1 g.  Immunofixation showed IgG lambda specificity.  Serum free light chain ratio was elevated at 25 and free light  chain lambda elevated at 370   Further myeloma work-up including a PET CT scan did not reveal any evidence of edematous lesions.  Bone marrow biopsy showed 23% plasma cells by manual count and 30% by CD138 IHC.  Normal cytogenetics.  FISH studies for myeloma showed gain of 1 q.  13 q-. detected.  P53 not detected.   He was treated for a year and has having smoldering multiple myeloma since both CKD and anemia have been stable for 3 to 4 years and could be secondary to uncontrolled diabetes.In June 2023 IgG levels increased to 5463 with an M protein of 4.1 g as compared to 2.6 g in September 2022.  Serum free light chain ratio remains around 25.  Therefore a repeat bone marrow biopsy was done which shows further increase in plasma cells ranging from 30 to 70% and by immunohistochemistry 60 to 70% of the cells were positive for CD138.  Cytogenetics normal.  FISH study showed 4: 14 translocation and gain of 1 q. making this high risk RISS stage III.  Repeat PET scan showed no lytic lesions     Interval history-patient is recovering from his recent fall and pneumothorax.  He still has a brace in place.  Denies any significant pain.  ECOG PS- 2 Pain scale- 3 Opioid associated constipation- no  Review of systems- Review of Systems  Constitutional:  Positive for malaise/fatigue. Negative for chills, fever and weight loss.  HENT:  Negative for congestion, ear discharge and nosebleeds.   Eyes:  Negative for blurred vision.  Respiratory:  Negative for  cough, hemoptysis, sputum production, shortness of breath and wheezing.   Cardiovascular:  Negative for chest pain, palpitations, orthopnea and claudication.  Gastrointestinal:  Negative for abdominal pain, blood in stool, constipation, diarrhea, heartburn, melena, nausea and vomiting.  Genitourinary:  Negative for dysuria, flank pain, frequency, hematuria and urgency.  Musculoskeletal:  Negative for back pain, joint pain and myalgias.  Skin:  Negative for  rash.  Neurological:  Negative for dizziness, tingling, focal weakness, seizures, weakness and headaches.  Endo/Heme/Allergies:  Does not bruise/bleed easily.  Psychiatric/Behavioral:  Negative for depression and suicidal ideas. The patient does not have insomnia.       Allergies  Allergen Reactions   Penicillins Other (See Comments)    Childhood allergy Unknown reaction Has patient had a PCN reaction causing immediate rash, facial/tongue/throat swelling, SOB or lightheadedness with hypotension: No Has patient had a PCN reaction causing severe rash involving mucus membranes or skin necrosis: No Has patient had a PCN reaction that required hospitalization No Has patient had a PCN reaction occurring within the last 10 years: No If all of the above answers are "NO", then may proceed with Cephalosporin use.      Past Medical History:  Diagnosis Date   DKA (diabetic ketoacidoses) 04/06/2016   Hypercholesteremia    Hypertension      Past Surgical History:  Procedure Laterality Date   COLONOSCOPY WITH PROPOFOL N/A 02/01/2020   Procedure: COLONOSCOPY WITH PROPOFOL;  Surgeon: Lin Landsman, MD;  Location: [facility] ENDOSCOPY;  Service: Gastroenterology;  Laterality: N/A;   ESOPHAGOGASTRODUODENOSCOPY  02/01/2020   Procedure: ESOPHAGOGASTRODUODENOSCOPY (EGD);  Surgeon: Lin Landsman, MD;  Location: [facility] ENDOSCOPY;  Service: Gastroenterology;;   KNEE SURGERY Right    Torn meniscus   KNEE SURGERY Left     Social History   Socioeconomic History   Marital status: Single    Spouse name: Not on file   Number of children: 3   Years of education: Not on file   Highest education level: High school graduate  Occupational History    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  Tobacco Use   Smoking status: Never   Smokeless tobacco: Never  Vaping Use   Vaping Use: Never used  Substance and Sexual Activity   Alcohol use: Not Currently    Alcohol/week: 0.0 - 1.0 standard drinks of alcohol     Comment: "once every 2 months"   Drug use: Not Currently    Types: Marijuana, "Crack" cocaine    Comment: last week   Sexual activity: Not Currently    Birth control/protection: None  Other Topics Concern   Not on file  Social History Narrative   Lives with girlfriend "sharon"   Social Determinants of Health   Financial Resource Strain: Low Risk  (01/01/2020)   Overall Financial Resource Strain (CARDIA)    Difficulty of Paying Living Expenses: Not hard at all  Food Insecurity: No Food Insecurity (03/10/2022)   Hunger Vital Sign    Worried About Running Out of Food in the Last Year: Never true    Ran Out of Food in the Last Year: Never true  Transportation Needs: No Transportation Needs (03/10/2022)   PRAPARE - Hydrologist (Medical): No    Lack of Transportation (Non-Medical): No  Physical Activity: Insufficiently Active (01/01/2020)   Exercise Vital Sign    Days of Exercise per Week: 7 days    Minutes of Exercise per Session: 20 min  Stress: No Stress Concern Present (01/01/2020)  [employer] - Occupational Stress Questionnaire    Feeling of Stress : Not at all  Social Connections: Moderately Isolated (01/01/2020)   Social Connection and Isolation Panel [NHANES]    Frequency of Communication with Friends and Family: More than three times a week    Frequency of Social Gatherings with Friends and Family: More than three times a week    Attends Religious Services: Never    Marine scientist or Organizations: No    Attends Archivist Meetings: Never    Marital Status: Living with partner  Intimate Partner Violence: Not At Risk (03/10/2022)   Humiliation, Afraid, Rape, and Kick questionnaire    Fear of Current or Ex-Partner: No    Emotionally Abused: No    Physically Abused: No    Sexually Abused: No    Family History  Problem Relation Age of Onset   Heart attack Father    Hypertension Sister    Cancer  Sister      Current Outpatient Medications:    acetaminophen (TYLENOL) 500 MG tablet, Take 1,000 mg by mouth daily as needed for mild pain or moderate pain., Disp: , Rfl:    aspirin EC 81 MG tablet, Take 81 mg by mouth in the morning., Disp: , Rfl:    atorvastatin (LIPITOR) 80 MG tablet, Take 1 tablet (80 mg total) by mouth at bedtime. (Patient taking differently: Take 80 mg by mouth in the morning.), Disp: 30 tablet, Rfl: 2   Baclofen 5 MG TABS, Take 5 mg by mouth daily as needed (muscle spasm)., Disp: , Rfl:    clopidogrel (PLAVIX) 75 MG tablet, Take 75 mg by mouth in the morning., Disp: , Rfl:    docusate sodium (COLACE) 100 MG capsule, Take 100 mg by mouth daily as needed for mild constipation., Disp: , Rfl:    FARXIGA 10 MG TABS tablet, Take 10 mg by mouth in the morning., Disp: , Rfl:    gabapentin (NEURONTIN) 300 MG capsule, Take 300-600 mg by mouth See admin instructions. 300 mg in the morning, 600 mg at night., Disp: , Rfl:    insulin degludec (TRESIBA FLEXTOUCH) 100 UNIT/ML FlexTouch Pen, Inject 15 Units into the skin daily., Disp: , Rfl:    insulin lispro (HUMALOG) 100 UNIT/ML KwikPen, Inject into the skin., Disp: , Rfl:    lenalidomide (REVLIMID) 10 MG capsule, Take 1 capsule (10 mg total) by mouth daily. Take for 14 days, then hold for 7 days. Repeat every 21 days., Disp: 6 capsule, Rfl: 0   NOVOLOG 100 UNIT/ML injection, Inject 0-9 Units into the skin 3 (three) times daily with meals., Disp: 10 mL, Rfl: 1   ondansetron (ZOFRAN) 8 MG tablet, Take 8 mg by mouth daily as needed for nausea or vomiting., Disp: , Rfl:    prochlorperazine (COMPAZINE) 10 MG tablet, Take 10 mg by mouth daily as needed for nausea or vomiting., Disp: , Rfl:    acyclovir (ZOVIRAX) 400 MG tablet, Take 400 mg by mouth in the morning. (Patient not taking: Reported on 03/10/2022), Disp: , Rfl:    dicyclomine (BENTYL) 20 MG tablet, Take 1 tablet (20 mg total) by mouth 3 (three) times daily with meals as needed for  spasms., Disp: 90 tablet, Rfl: 0  Physical exam:  Vitals:   03/20/22 1140  BP: 125/79  Pulse: 73  Resp: 16  Temp: (!) 96.5 F (35.8 C)  TempSrc: Tympanic  SpO2: 100%  Weight: 162 lb (73.5 kg)  Height:  _0  (1.778 m)   Physical Exam Constitutional:      General: He is not in acute distress. Cardiovascular:     Rate and Rhythm: Normal rate and regular rhythm.     Heart sounds: Normal heart sounds.  Pulmonary:     Effort: Pulmonary effort is normal.     Breath sounds: Normal breath sounds.  Abdominal:     General: Bowel sounds are normal.     Palpations: Abdomen is soft.  Skin:    General: Skin is warm and dry.  Neurological:     Mental Status: He is alert and oriented to person, place, and time.         Latest Ref Rng & Units 03/20/2022   11:29 AM  CMP  Glucose 70 - 99 mg/dL 141   BUN 8 - 23 mg/dL 29   Creatinine 0.61 - 1.24 mg/dL 1.38   Sodium 135 - 145 mmol/L 138   Potassium 3.5 - 5.1 mmol/L 4.8   Chloride 98 - 111 mmol/L 107   CO2 22 - 32 mmol/L 23   Calcium 8.9 - 10.3 mg/dL 7.7   Total Protein 6.5 - 8.1 g/dL 6.4   Total Bilirubin 0.3 - 1.2 mg/dL 0.3   Alkaline Phos 38 - 126 U/L 71   AST 15 - 41 U/L 17   ALT 0 - 44 U/L 22       Latest Ref Rng & Units 03/20/2022   11:29 AM  CBC  WBC 4.0 - 10.5 K/uL 6.5   Hemoglobin 13.0 - 17.0 g/dL 7.1   Hematocrit 39.0 - 52.0 % 22.4   Platelets 150 - 400 K/uL 301     No images are attached to the encounter.  US ARTERIAL ABI (SCREENING LOWER EXTREMITY)  Result Date: 03/11/2022 CLINICAL DATA:  Toe ulcers. EXAM: NONINVASIVE PHYSIOLOGIC VASCULAR STUDY OF BILATERAL LOWER EXTREMITIES TECHNIQUE: Evaluation of both lower extremities were performed at rest, including calculation of ankle-brachial indices with single level Doppler, pressure and pulse volume recording. COMPARISON:  None Available. FINDINGS: Right ABI:  1.16 Left ABI:  1.24 Right Lower Extremity:  Normal arterial waveforms at the ankle. Left Lower Extremity:   Normal arterial waveforms at the ankle. 1.0-1.4 Normal IMPRESSION: Normal ankle-brachial indices bilaterally. Electronically Signed   By: Markus Daft M.D.   On: 03/11/2022 16:47   MR BRAIN WO CONTRAST  Result Date: 03/10/2022 CLINICAL DATA:  Encephalopathy EXAM: MRI HEAD WITHOUT CONTRAST TECHNIQUE: Multiplanar, multiecho pulse sequences of the brain and surrounding structures were obtained without intravenous contrast. COMPARISON:  None Available. FINDINGS: Brain: No acute infarct, mass effect or extra-axial collection. Approximately 5 bilateral periventricular chronic microhemorrhages. No acute hemorrhage. There is multifocal hyperintense T2-weighted signal within the white matter. Parenchymal volume and CSF spaces are normal. The midline structures are normal. Vascular: Major flow voids are preserved. Skull and upper cervical spine: Normal calvarium and skull base. Visualized upper cervical spine and soft tissues are normal. Sinuses/Orbits:No paranasal sinus fluid levels or advanced mucosal thickening. No mastoid or middle ear effusion. Normal orbits. IMPRESSION: 1. No acute intracranial abnormality. 2. Findings of chronic small vessel ischemia. Electronically Signed   By: Ulyses Jarred M.D.   On: 03/10/2022 01:38   DG Chest Port 1 View  Result Date: 02/24/2022 CLINICAL DATA:  Chest pain history of fall EXAM: PORTABLE CHEST 1 VIEW COMPARISON:  Chest radiograph 1 day prior FINDINGS: The heart is enlarged, unchanged. The upper mediastinal contours are stable. Aeration of the lung bases is improved  compared to the study from 1 day prior. There is no new or worsening focal airspace disease. There is a probable trace left pleural effusion. There is no significant right effusion. There is no pneumothorax The IMPRESSION: 1. Improved aeration of the lung bases since the study from 1 day prior with a probable trace residual left pleural effusion. No appreciable pneumothorax. 2. Unchanged left ninth rib fracture  and associated subcutaneous emphysema. Electronically Signed   By: Valetta Mole M.D.   On: 02/24/2022 10:55   DG CHEST PORT 1 VIEW  Result Date: 02/23/2022 CLINICAL DATA:  Follow-up left pneumothorax. EXAM: PORTABLE CHEST 1 VIEW COMPARISON:  02/22/2022.  CT, 02/17/2022. FINDINGS: No visualized pneumothorax. Left anterolateral and neck base subcutaneous emphysema is stable. Mildly displaced fracture of the left lateral ninth rib, also unchanged. Bilateral lung base opacities consistent with atelectasis, stable on the left, improved on the right the previous exam. Remainder of the lungs is clear. IMPRESSION: 1. No visualized residual left pneumothorax. 2. Improved right lung base atelectasis. No other change from the previous exam. Electronically Signed   By: Lajean Manes M.D.   On: 02/23/2022 10:10   DG CHEST PORT 1 VIEW  Result Date: 02/22/2022 CLINICAL DATA:  Pneumothorax. EXAM: PORTABLE CHEST 1 VIEW COMPARISON:  February 22, 2022. FINDINGS: The heart size and mediastinal contours are within normal limits. Left-sided chest tube has been removed. Minimal left apical pneumothorax is noted. Bibasilar atelectasis is noted. Stable subcutaneous emphysema is seen over left lateral chest wall and supraclavicular region. The visualized skeletal structures are unremarkable. IMPRESSION: Minimal left apical pneumothorax is noted status post left-sided chest tube removal. Stable subcutaneous emphysema is noted. Mild bibasilar atelectasis is noted. Electronically Signed   By: Marijo Conception M.D.   On: 02/22/2022 12:24   DG CHEST PORT 1 VIEW  Result Date: 02/22/2022 CLINICAL DATA:  Chest tube EXAM: PORTABLE CHEST 1 VIEW COMPARISON:  02/21/2022 FINDINGS: Left chest tube remains in place without evidence for pneumothorax. Skin fold projects over the left lung apex. Subsegmental atelectasis noted right base. The cardiopericardial silhouette is within normal limits for size. Subcutaneous emphysema persists in the  left chest wall. Telemetry leads overlie the chest. IMPRESSION: Left chest tube remains in place without evidence for pneumothorax. Electronically Signed   By: Misty Stanley M.D.   On: 02/22/2022 09:30   DG CHEST PORT 1 VIEW  Result Date: 02/21/2022 CLINICAL DATA:  Follow-up left-sided pneumothorax. EXAM: PORTABLE CHEST 1 VIEW COMPARISON:  Chest x-ray 02/20/2022 FINDINGS: The left sided chest tube is stable. No pneumothorax. Stable subcutaneous emphysema. Persistent right lower lobe atelectasis. IMPRESSION: Stable left sided chest tube without pneumothorax. Stable subsegmental atelectasis at the right lung base. Electronically Signed   By: Marijo Sanes M.D.   On: 02/21/2022 11:29   DG CHEST PORT 1 VIEW  Result Date: 02/20/2022 CLINICAL DATA:  LEFT pneumothorax, follow-up EXAM: PORTABLE CHEST 1 VIEW COMPARISON:  Portable exam 0651 hours compared to 02/19/2022 FINDINGS: Pigtail LEFT thoracostomy tube unchanged. Upper normal heart size. Mediastinal contours and pulmonary vascularity normal. Atherosclerotic calcification aorta. Bibasilar atelectasis greater on RIGHT. Skin fold projects over LEFT chest. No acute infiltrate, pleural effusion, or pneumothorax. LEFT chest wall emphysema extending into LEFT cervical region. IMPRESSION: Bibasilar atelectasis greater on RIGHT. LEFT thoracostomy tube without pneumothorax. Aortic Atherosclerosis (ICD10-I70.0). Electronically Signed   By: Lavonia Dana M.D.   On: 02/20/2022 08:38   DG CHEST PORT 1 VIEW  Result Date: 02/19/2022 CLINICAL DATA:  Trauma.  Rib fracture.  EXAM: PORTABLE CHEST 1 VIEW COMPARISON:  02/18/2022 FINDINGS: Stable asymmetric elevation right hemidiaphragm with right base atelectasis. Left pleural drain again noted without definite evidence for left-sided pneumothorax. Subcutaneous emphysema noted left chest wall. Cardiopericardial silhouette is at upper limits of normal for size. Lower left rib fracture evident. IMPRESSION: 1. Left pleural drain  without definite evidence for pneumothorax. Left heart border and left hemidiaphragm are well defined raising the question of anteromedial pleural air. 2. Stable asymmetric elevation right hemidiaphragm with right base atelectasis. Electronically Signed   By: Misty Stanley M.D.   On: 02/19/2022 08:48     Assessment and plan- Patient is a 71 y.o. male  with R-ISS stage III high risk IgG lambda multiple myeloma.   He is here for on treatment assessment prior to cycle 7-day 15 of Velcade  Patient is quite late for his appointment today and is therefore unable to get his Velcade infusion today.  There is also no room for infusion next week and therefore he will be returning in 2 weeks for cycle 8-day 1 of Velcade.  He will restart Revlimid 10 mg 2 weeks on and 1 week off in 2 weeks.  He will also receive Retacrit on that day.  Will repeat myeloma panel and serum free light chainIn 2 weeks.  Serum free lambda light chain has been trending down and 2 weeks ago it was 68 as compared to 275 a year ago.  Free light chain ratio is now normal.  Continue to monitor.  He continues to have an M protein detected which is still persistent at 1.8 g.  There have been multiple interruptions in his treatments due to hospitalizations on and off.  Depending on his labs in 2 weeks I will decide if I need to proceed with adding Darzalex as well  Continue aspirin and acyclovir prophylaxis   Visit Diagnosis 1. Multiple myeloma not having achieved remission ([place])      Dr. Randa Evens, MD, MPH [facility] at [facility] [phone] 03/20/2022 1:04 PM

## 2022-03-22 ENCOUNTER — Other Ambulatory Visit: Payer: Self-pay

## 2022-03-22 ENCOUNTER — Other Ambulatory Visit: Payer: Self-pay | Admitting: Oncology

## 2022-03-22 DIAGNOSIS — C9000 Multiple myeloma not having achieved remission: Secondary | ICD-10-CM

## 2022-03-24 ENCOUNTER — Other Ambulatory Visit: Payer: Self-pay | Admitting: Oncology

## 2022-03-26 ENCOUNTER — Other Ambulatory Visit: Payer: Self-pay

## 2022-03-28 ENCOUNTER — Other Ambulatory Visit: Payer: Self-pay

## 2022-04-01 ENCOUNTER — Other Ambulatory Visit: Payer: Self-pay

## 2022-04-03 ENCOUNTER — Inpatient Hospital Stay: Payer: 59 | Attending: Oncology

## 2022-04-03 ENCOUNTER — Inpatient Hospital Stay (HOSPITAL_BASED_OUTPATIENT_CLINIC_OR_DEPARTMENT_OTHER): Payer: 59 | Admitting: Oncology

## 2022-04-03 ENCOUNTER — Encounter: Payer: Self-pay | Admitting: Oncology

## 2022-04-03 ENCOUNTER — Inpatient Hospital Stay: Payer: 59

## 2022-04-03 ENCOUNTER — Other Ambulatory Visit: Payer: Self-pay | Admitting: *Deleted

## 2022-04-03 VITALS — BP 169/90 | HR 72 | Temp 97.6°F | Resp 16 | Wt 154.6 lb

## 2022-04-03 DIAGNOSIS — Z5112 Encounter for antineoplastic immunotherapy: Secondary | ICD-10-CM | POA: Insufficient documentation

## 2022-04-03 DIAGNOSIS — D631 Anemia in chronic kidney disease: Secondary | ICD-10-CM | POA: Diagnosis not present

## 2022-04-03 DIAGNOSIS — Z5111 Encounter for antineoplastic chemotherapy: Secondary | ICD-10-CM

## 2022-04-03 DIAGNOSIS — I129 Hypertensive chronic kidney disease with stage 1 through stage 4 chronic kidney disease, or unspecified chronic kidney disease: Secondary | ICD-10-CM | POA: Diagnosis present

## 2022-04-03 DIAGNOSIS — C9000 Multiple myeloma not having achieved remission: Secondary | ICD-10-CM | POA: Insufficient documentation

## 2022-04-03 DIAGNOSIS — Z794 Long term (current) use of insulin: Secondary | ICD-10-CM | POA: Insufficient documentation

## 2022-04-03 DIAGNOSIS — L02612 Cutaneous abscess of left foot: Secondary | ICD-10-CM

## 2022-04-03 DIAGNOSIS — Z7902 Long term (current) use of antithrombotics/antiplatelets: Secondary | ICD-10-CM | POA: Insufficient documentation

## 2022-04-03 DIAGNOSIS — Z7961 Long term (current) use of immunomodulator: Secondary | ICD-10-CM | POA: Insufficient documentation

## 2022-04-03 DIAGNOSIS — D649 Anemia, unspecified: Secondary | ICD-10-CM

## 2022-04-03 DIAGNOSIS — Z79624 Long term (current) use of inhibitors of nucleotide synthesis: Secondary | ICD-10-CM | POA: Diagnosis not present

## 2022-04-03 DIAGNOSIS — E1022 Type 1 diabetes mellitus with diabetic chronic kidney disease: Secondary | ICD-10-CM | POA: Diagnosis not present

## 2022-04-03 DIAGNOSIS — E1040 Type 1 diabetes mellitus with diabetic neuropathy, unspecified: Secondary | ICD-10-CM

## 2022-04-03 DIAGNOSIS — Z7984 Long term (current) use of oral hypoglycemic drugs: Secondary | ICD-10-CM | POA: Diagnosis not present

## 2022-04-03 DIAGNOSIS — N179 Acute kidney failure, unspecified: Secondary | ICD-10-CM | POA: Diagnosis not present

## 2022-04-03 DIAGNOSIS — Z79899 Other long term (current) drug therapy: Secondary | ICD-10-CM | POA: Insufficient documentation

## 2022-04-03 DIAGNOSIS — N183 Chronic kidney disease, stage 3 unspecified: Secondary | ICD-10-CM

## 2022-04-03 LAB — CBC WITH DIFFERENTIAL/PLATELET
Abs Immature Granulocytes: 0.01 10*3/uL (ref 0.00–0.07)
Basophils Absolute: 0 10*3/uL (ref 0.0–0.1)
Basophils Relative: 1 %
Eosinophils Absolute: 0.1 10*3/uL (ref 0.0–0.5)
Eosinophils Relative: 3 %
HCT: 25.4 % — ABNORMAL LOW (ref 39.0–52.0)
Hemoglobin: 8.1 g/dL — ABNORMAL LOW (ref 13.0–17.0)
Immature Granulocytes: 0 %
Lymphocytes Relative: 37 %
Lymphs Abs: 1.2 10*3/uL (ref 0.7–4.0)
MCH: 29.6 pg (ref 26.0–34.0)
MCHC: 31.9 g/dL (ref 30.0–36.0)
MCV: 92.7 fL (ref 80.0–100.0)
Monocytes Absolute: 0.5 10*3/uL (ref 0.1–1.0)
Monocytes Relative: 15 %
Neutro Abs: 1.4 10*3/uL — ABNORMAL LOW (ref 1.7–7.7)
Neutrophils Relative %: 44 %
Platelets: 433 10*3/uL — ABNORMAL HIGH (ref 150–400)
RBC: 2.74 MIL/uL — ABNORMAL LOW (ref 4.22–5.81)
RDW: 15.8 % — ABNORMAL HIGH (ref 11.5–15.5)
WBC: 3.2 10*3/uL — ABNORMAL LOW (ref 4.0–10.5)
nRBC: 0 % (ref 0.0–0.2)

## 2022-04-03 LAB — COMPREHENSIVE METABOLIC PANEL
ALT: 23 U/L (ref 0–44)
AST: 19 U/L (ref 15–41)
Albumin: 3.1 g/dL — ABNORMAL LOW (ref 3.5–5.0)
Alkaline Phosphatase: 91 U/L (ref 38–126)
Anion gap: 10 (ref 5–15)
BUN: 19 mg/dL (ref 8–23)
CO2: 26 mmol/L (ref 22–32)
Calcium: 8.1 mg/dL — ABNORMAL LOW (ref 8.9–10.3)
Chloride: 98 mmol/L (ref 98–111)
Creatinine, Ser: 1.25 mg/dL — ABNORMAL HIGH (ref 0.61–1.24)
GFR, Estimated: >60 mL/min (ref >60)
Glucose, Bld: 218 mg/dL — ABNORMAL HIGH (ref 70–99)
Potassium: 5 mmol/L (ref 3.5–5.1)
Sodium: 134 mmol/L — ABNORMAL LOW (ref 135–145)
Total Bilirubin: 0.4 mg/dL (ref 0.3–1.2)
Total Protein: 8.6 g/dL — ABNORMAL HIGH (ref 6.5–8.1)

## 2022-04-03 LAB — IRON AND TIBC
Iron: 42 ug/dL — ABNORMAL LOW (ref 45–182)
Saturation Ratios: 17 % — ABNORMAL LOW (ref 17.9–39.5)
TIBC: 245 ug/dL — ABNORMAL LOW (ref 250–450)
UIBC: 203 ug/dL

## 2022-04-03 LAB — VITAMIN B12: Vitamin B-12: 426 pg/mL (ref 180–914)

## 2022-04-03 LAB — FERRITIN: Ferritin: 89 ng/mL (ref 24–336)

## 2022-04-03 LAB — FOLATE: Folate: 25 ng/mL (ref >5.9)

## 2022-04-03 MED ORDER — DEXAMETHASONE 4 MG PO TABS
40.0000 mg | ORAL_TABLET | Freq: Once | ORAL | Status: AC
  Administered 2022-04-03: 40 mg via ORAL
  Filled 2022-04-03: qty 10

## 2022-04-03 MED ORDER — SODIUM CHLORIDE 0.9 % IV SOLN
Freq: Once | INTRAVENOUS | Status: DC
  Filled 2022-04-03: qty 250

## 2022-04-03 MED ORDER — BORTEZOMIB CHEMO SQ INJECTION 3.5 MG (2.5MG/ML)
1.3000 mg/m2 | Freq: Once | INTRAMUSCULAR | Status: AC
  Administered 2022-04-03: 2.5 mg via SUBCUTANEOUS
  Filled 2022-04-03: qty 1

## 2022-04-03 MED ORDER — ZOLEDRONIC ACID 4 MG/100ML IV SOLN: 4.0000 mg | INTRAVENOUS | Status: DC

## 2022-04-03 MED ORDER — EPOETIN ALFA-EPBX 40000 UNIT/ML IJ SOLN
40000.0000 [IU] | INTRAMUSCULAR | Status: DC
  Administered 2022-04-03: 40000 [IU] via SUBCUTANEOUS
  Filled 2022-04-03: qty 1

## 2022-04-03 NOTE — Progress Notes (Signed)
Pt reports taking revlimid, gabapentin and insulin. States he is out of some of his medications but unsure which ones. Instructed for pt to get his medication bottles and call pharmacy for refills if they had refills they will refill and if there are no refills they will call appropriate providers for refills.  Pt verbalized understanding.

## 2022-04-05 ENCOUNTER — Encounter: Payer: Self-pay | Admitting: Oncology

## 2022-04-05 ENCOUNTER — Other Ambulatory Visit: Payer: Self-pay

## 2022-04-05 NOTE — Progress Notes (Signed)
Hematology/Oncology Consult note [facility]  [phone] [phone]  Patient Care Team: Dion Body, MD as PCP - General (Family Medicine) [place], [place] (Optometry) Sindy Guadeloupe, MD as Consulting Physician (Oncology)   Name of the patient: Nicholas Weiss  [id number]  May 29, 1950   Date of visit: 04/05/22  Diagnosis- high risk IgG lambda multiple myeloma R-ISS stage III     Chief complaint/ Reason for visit-on treatment assessment prior to cycle 8-day 1 of Velcade  Heme/Onc history: Patient is a 72 year old African-American male with a past medical history significant for uncontrolled type 1 diabetes, chronic kidney disease stage III chronic normocytic anemia referred for abnormal SPEP. Patient's creatinine has been fluctuating between 1.5-2 but about 5 months ago it went up all the way to 5 and his blood sugars were in the 1000 range. More recently his kidney numbers have been drifting back to normal values. As a part of the work-up he had serum protein electrophoresis done which showed an elevated gammaglobulin fraction with an M spike of 3%. The amount of M spike was not quantified in the specimen. He has therefore been referred to [place] for further management. Patient endorses chronic fatigue reports that his appetite and weight have remained stable   Results of blood work from 04/08/2020 were as follows: CBC showed white count of 2.9, H&H of 10.5/31.2 with an MCV of 91 and a platelet count of 191.  CMP showed a mildly elevated creatinine of 1.2 which was better as compared to 5 months ago when it was 3.9.  Total protein was mildly elevated at 9.1 calcium normal at 8.4 ferritin and iron studies B12 folate TSH and haptoglobin were normal.  Myeloma panel revealed an elevated IgG level of 06/04/2004 with an M protein of 3.1 g.  Immunofixation showed IgG lambda specificity.  Serum free light chain ratio was elevated at 25 and free light  chain lambda elevated at 370   Further myeloma work-up including a PET CT scan did not reveal any evidence of edematous lesions.  Bone marrow biopsy showed 23% plasma cells by manual count and 30% by CD138 IHC.  Normal cytogenetics.  FISH studies for myeloma showed gain of 1 q.  13 q-. detected.  P53 not detected.   He was treated for a year and has having smoldering multiple myeloma since both CKD and anemia have been stable for 3 to 4 years and could be secondary to uncontrolled diabetes.In June 2023 IgG levels increased to 5463 with an M protein of 4.1 g as compared to 2.6 g in September 2022.  Serum free light chain ratio remains around 25.  Therefore a repeat bone marrow biopsy was done which shows further increase in plasma cells ranging from 30 to 70% and by immunohistochemistry 60 to 70% of the cells were positive for CD138.  Cytogenetics normal.  FISH study showed 4: 14 translocation and gain of 1 q. making this high risk RISS stage III.  Repeat PET scan showed no lytic lesions  Interval history-patient is not wearing his back brace today and states that his pain is currently better controlled.  He has ongoing fatigue.  [place] for hypoglycemic episode.he does complain of leg swelling  ECOG PS- 2 Pain scale- 0  Review of systems- Review of Systems  Constitutional:  Positive for malaise/fatigue.      Allergies  Allergen Reactions   Penicillins Other (See Comments)    Childhood  allergy Unknown reaction Has patient had a PCN reaction causing immediate rash, facial/tongue/throat swelling, SOB or lightheadedness with hypotension: No Has patient had a PCN reaction causing severe rash involving mucus membranes or skin necrosis: No Has patient had a PCN reaction that required hospitalization No Has patient had a PCN reaction occurring within the last 10 years: No If all of the above answers are "NO", then may proceed with Cephalosporin use.       Past Medical History:  Diagnosis Date   DKA (diabetic ketoacidoses) 04/06/2016   Hypercholesteremia    Hypertension      Past Surgical History:  Procedure Laterality Date   COLONOSCOPY WITH PROPOFOL N/A 02/01/2020   Procedure: COLONOSCOPY WITH PROPOFOL;  Surgeon: Lin Landsman, MD;  Location: [facility] ENDOSCOPY;  Service: Gastroenterology;  Laterality: N/A;   ESOPHAGOGASTRODUODENOSCOPY  02/01/2020   Procedure: ESOPHAGOGASTRODUODENOSCOPY (EGD);  Surgeon: Lin Landsman, MD;  Location: [facility] ENDOSCOPY;  Service: Gastroenterology;;   KNEE SURGERY Right    Torn meniscus   KNEE SURGERY Left     Social History   Socioeconomic History   Marital status: Single    Spouse name: Not on file   Number of children: 3   Years of education: Not on file   Highest education level: High school graduate  Occupational History    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  Tobacco Use   Smoking status: Never   Smokeless tobacco: Never  Vaping Use   Vaping Use: Never used  Substance and Sexual Activity   Alcohol use: Not Currently    Alcohol/week: 0.0 - 1.0 standard drinks of alcohol    Comment: "once every 2 months"   Drug use: Not Currently    Types: Marijuana, "Crack" cocaine    Comment: last week   Sexual activity: Not Currently    Birth control/protection: None  Other Topics Concern   Not on file  Social History Narrative   Lives with girlfriend "sharon"   Social Determinants of Health   Financial Resource Strain: Low Risk  (01/01/2020)   Overall Financial Resource Strain (CARDIA)    Difficulty of Paying Living Expenses: Not hard at all  Food Insecurity: No Food Insecurity (03/10/2022)   Hunger Vital Sign    Worried About Running Out of Food in the Last Year: Never true    Ran Out of Food in the Last Year: Never true  Transportation Needs: No Transportation Needs (03/10/2022)   PRAPARE - Hydrologist (Medical): No    Lack of Transportation  (Non-Medical): No  Physical Activity: Insufficiently Active (01/01/2020)   Exercise Vital Sign    Days of Exercise per Week: 7 days    Minutes of Exercise per Session: 20 min  Stress: No Stress Concern Present (01/01/2020)   [place]    Feeling of Stress : Not at all  Social Connections: Moderately Isolated (01/01/2020)   Social Connection and Isolation Panel [NHANES]    Frequency of Communication with Friends and Family: More than three times a week    Frequency of Social Gatherings with Friends and Family: More than three times a week    Attends Religious Services: Never    Marine scientist or Organizations: No    Attends Archivist Meetings: Never    Marital Status: Living with partner  Intimate Partner Violence: Not At Risk (03/10/2022)   Humiliation, Afraid, Rape, and Kick questionnaire    Fear of Current  or Ex-Partner: No    Emotionally Abused: No    Physically Abused: No    Sexually Abused: No    Family History  Problem Relation Age of Onset   Heart attack Father    Hypertension Sister    Cancer Sister      Current Outpatient Medications:    acetaminophen (TYLENOL) 500 MG tablet, Take 1,000 mg by mouth daily as needed for mild pain or moderate pain., Disp: , Rfl:    aspirin EC 81 MG tablet, Take 81 mg by mouth in the morning., Disp: , Rfl:    atorvastatin (LIPITOR) 80 MG tablet, Take 1 tablet (80 mg total) by mouth at bedtime. (Patient taking differently: Take 80 mg by mouth in the morning.), Disp: 30 tablet, Rfl: 2   Baclofen 5 MG TABS, Take 5 mg by mouth daily as needed (muscle spasm)., Disp: , Rfl:    clopidogrel (PLAVIX) 75 MG tablet, Take 75 mg by mouth in the morning., Disp: , Rfl:    docusate sodium (COLACE) 100 MG capsule, Take 100 mg by mouth daily as needed for mild constipation., Disp: , Rfl:    FARXIGA 10 MG TABS tablet, Take 10 mg by mouth in the morning., Disp: , Rfl:     gabapentin (NEURONTIN) 300 MG capsule, Take 300-600 mg by mouth See admin instructions. 300 mg in the morning, 600 mg at night., Disp: , Rfl:    insulin degludec (TRESIBA FLEXTOUCH) 100 UNIT/ML FlexTouch Pen, Inject 15 Units into the skin daily., Disp: , Rfl:    lenalidomide (REVLIMID) 10 MG capsule, Take 1 capsule (10 mg total) by mouth daily. Take 10 mg daily for 14 days and then off 7 days ( start the meds on 04/03/2022) obtained1/03/2022, REMS: [id number], Disp: 14 capsule, Rfl: 0   NOVOLOG 100 UNIT/ML injection, Inject 0-9 Units into the skin 3 (three) times daily with meals., Disp: 10 mL, Rfl: 1   ondansetron (ZOFRAN) 8 MG tablet, Take 8 mg by mouth daily as needed for nausea or vomiting., Disp: , Rfl:    prochlorperazine (COMPAZINE) 10 MG tablet, Take 10 mg by mouth daily as needed for nausea or vomiting., Disp: , Rfl:    acyclovir (ZOVIRAX) 400 MG tablet, Take 400 mg by mouth in the morning. (Patient not taking: Reported on 03/10/2022), Disp: , Rfl:    dicyclomine (BENTYL) 20 MG tablet, Take 1 tablet (20 mg total) by mouth 3 (three) times daily with meals as needed for spasms. (Patient not taking: Reported on 04/03/2022), Disp: 90 tablet, Rfl: 0   insulin lispro (HUMALOG) 100 UNIT/ML KwikPen, Inject into the skin. (Patient not taking: Reported on 04/03/2022), Disp: , Rfl:   Physical exam:  Vitals:   04/03/22 1445  BP: (!) 169/90  Pulse: 72  Resp: 16  Temp: 97.6 F (36.4 C)  TempSrc: Tympanic  Weight: 154 lb 9.6 oz (70.1 kg)   Physical Exam Cardiovascular:     Rate and Rhythm: Normal rate and regular rhythm.     Heart sounds: Normal heart sounds.  Pulmonary:     Effort: Pulmonary effort is normal.     Breath sounds: Normal breath sounds.  Abdominal:     General: Bowel sounds are normal.     Palpations: Abdomen is soft.  Musculoskeletal:     Comments: B/L LE edema L >R  Skin:    General: Skin is warm and dry.  Neurological:     Mental Status: He is alert and oriented to person,  place, and  time.         Latest Ref Rng & Units 04/03/2022    2:10 PM  CMP  Glucose 70 - 99 mg/dL 218   BUN 8 - 23 mg/dL 19   Creatinine 0.61 - 1.24 mg/dL 1.25   Sodium 135 - 145 mmol/L 134   Potassium 3.5 - 5.1 mmol/L 5.0   Chloride 98 - 111 mmol/L 98   CO2 22 - 32 mmol/L 26   Calcium 8.9 - 10.3 mg/dL 8.1   Total Protein 6.5 - 8.1 g/dL 8.6   Total Bilirubin 0.3 - 1.2 mg/dL 0.4   Alkaline Phos 38 - 126 U/L 91   AST 15 - 41 U/L 19   ALT 0 - 44 U/L 23       Latest Ref Rng & Units 04/03/2022    2:10 PM  CBC  WBC 4.0 - 10.5 K/uL 3.2   Hemoglobin 13.0 - 17.0 g/dL 8.1   Hematocrit 39.0 - 52.0 % 25.4   Platelets 150 - 400 K/uL 433     No images are attached to the encounter.  US ARTERIAL ABI (SCREENING LOWER EXTREMITY)  Result Date: 03/11/2022 CLINICAL DATA:  Toe ulcers. EXAM: NONINVASIVE PHYSIOLOGIC VASCULAR STUDY OF BILATERAL LOWER EXTREMITIES TECHNIQUE: Evaluation of both lower extremities were performed at rest, including calculation of ankle-brachial indices with single level Doppler, pressure and pulse volume recording. COMPARISON:  None Available. FINDINGS: Right ABI:  1.16 Left ABI:  1.24 Right Lower Extremity:  Normal arterial waveforms at the ankle. Left Lower Extremity:  Normal arterial waveforms at the ankle. 1.0-1.4 Normal IMPRESSION: Normal ankle-brachial indices bilaterally. Electronically Signed   By: Markus Daft M.D.   On: 03/11/2022 16:47   MR BRAIN WO CONTRAST  Result Date: 03/10/2022 CLINICAL DATA:  Encephalopathy EXAM: MRI HEAD WITHOUT CONTRAST TECHNIQUE: Multiplanar, multiecho pulse sequences of the brain and surrounding structures were obtained without intravenous contrast. COMPARISON:  None Available. FINDINGS: Brain: No acute infarct, mass effect or extra-axial collection. Approximately 5 bilateral periventricular chronic microhemorrhages. No acute hemorrhage. There is multifocal hyperintense T2-weighted signal within the white matter. Parenchymal volume and CSF  spaces are normal. The midline structures are normal. Vascular: Major flow voids are preserved. Skull and upper cervical spine: Normal calvarium and skull base. Visualized upper cervical spine and soft tissues are normal. Sinuses/Orbits:No paranasal sinus fluid levels or advanced mucosal thickening. No mastoid or middle ear effusion. Normal orbits. IMPRESSION: 1. No acute intracranial abnormality. 2. Findings of chronic small vessel ischemia. Electronically Signed   By: Ulyses Jarred M.D.   On: 03/10/2022 01:38     Assessment and plan- Patient is a 72 y.o. male  with R-ISS stage III high risk IgG lambda multiple myeloma.   He is here for on treatment assessment prior to cycle 8-day 1 of Velcade   Counts okay to proceed with cycle 8-day 1 of Velcade today.  Patient will also receive Retacrit today.  He will start his next cycle of Revlimid today.  He has been getting weekly Velcade and Revlimid 2 weeks on 1 week off 10 mg.  Myeloma number's as well as serum free light chains have been trending down albeit slowly.  I am waiting on numbers from today to decide if I need to add Darzalex to his regimen.   Bilateral lower extremity edema: Left greater than right I will obtain a left lower extremity ultrasound at this point    Visit Diagnosis 1. Multiple myeloma not having achieved remission ([place])  2. Encounter for antineoplastic chemotherapy   3. High risk medication use   4. Erythropoietin (EPO) stimulating agent anemia management patient      Dr. Randa Evens, MD, MPH [facility] [phone] 04/05/2022 9:14 AM

## 2022-04-06 ENCOUNTER — Emergency Department: Payer: 59

## 2022-04-06 ENCOUNTER — Encounter: Payer: Self-pay | Admitting: Emergency Medicine

## 2022-04-06 DIAGNOSIS — Z880 Allergy status to penicillin: Secondary | ICD-10-CM

## 2022-04-06 DIAGNOSIS — D631 Anemia in chronic kidney disease: Secondary | ICD-10-CM | POA: Diagnosis present

## 2022-04-06 DIAGNOSIS — U071 COVID-19: Secondary | ICD-10-CM | POA: Diagnosis present

## 2022-04-06 DIAGNOSIS — R55 Syncope and collapse: Secondary | ICD-10-CM | POA: Diagnosis present

## 2022-04-06 DIAGNOSIS — E1010 Type 1 diabetes mellitus with ketoacidosis without coma: Secondary | ICD-10-CM | POA: Diagnosis not present

## 2022-04-06 DIAGNOSIS — G9341 Metabolic encephalopathy: Secondary | ICD-10-CM | POA: Diagnosis present

## 2022-04-06 DIAGNOSIS — Z7982 Long term (current) use of aspirin: Secondary | ICD-10-CM

## 2022-04-06 DIAGNOSIS — R42 Dizziness and giddiness: Secondary | ICD-10-CM | POA: Diagnosis present

## 2022-04-06 DIAGNOSIS — C9000 Multiple myeloma not having achieved remission: Secondary | ICD-10-CM | POA: Diagnosis present

## 2022-04-06 DIAGNOSIS — Z79899 Other long term (current) drug therapy: Secondary | ICD-10-CM

## 2022-04-06 DIAGNOSIS — E7800 Pure hypercholesterolemia, unspecified: Secondary | ICD-10-CM | POA: Diagnosis present

## 2022-04-06 DIAGNOSIS — I129 Hypertensive chronic kidney disease with stage 1 through stage 4 chronic kidney disease, or unspecified chronic kidney disease: Secondary | ICD-10-CM | POA: Diagnosis present

## 2022-04-06 DIAGNOSIS — Z794 Long term (current) use of insulin: Secondary | ICD-10-CM

## 2022-04-06 DIAGNOSIS — N1831 Chronic kidney disease, stage 3a: Secondary | ICD-10-CM | POA: Diagnosis present

## 2022-04-06 DIAGNOSIS — Z515 Encounter for palliative care: Secondary | ICD-10-CM

## 2022-04-06 DIAGNOSIS — E875 Hyperkalemia: Secondary | ICD-10-CM | POA: Diagnosis present

## 2022-04-06 DIAGNOSIS — E860 Dehydration: Secondary | ICD-10-CM | POA: Diagnosis present

## 2022-04-06 DIAGNOSIS — Z8249 Family history of ischemic heart disease and other diseases of the circulatory system: Secondary | ICD-10-CM

## 2022-04-06 DIAGNOSIS — Z7902 Long term (current) use of antithrombotics/antiplatelets: Secondary | ICD-10-CM

## 2022-04-06 DIAGNOSIS — F1410 Cocaine abuse, uncomplicated: Secondary | ICD-10-CM | POA: Diagnosis present

## 2022-04-06 DIAGNOSIS — E1022 Type 1 diabetes mellitus with diabetic chronic kidney disease: Secondary | ICD-10-CM | POA: Diagnosis present

## 2022-04-06 DIAGNOSIS — E10649 Type 1 diabetes mellitus with hypoglycemia without coma: Secondary | ICD-10-CM | POA: Diagnosis not present

## 2022-04-06 DIAGNOSIS — N179 Acute kidney failure, unspecified: Secondary | ICD-10-CM | POA: Diagnosis present

## 2022-04-06 LAB — CBC WITH DIFFERENTIAL/PLATELET
Abs Immature Granulocytes: 0.02 10*3/uL (ref 0.00–0.07)
Basophils Absolute: 0 10*3/uL (ref 0.0–0.1)
Basophils Relative: 1 %
Eosinophils Absolute: 0 10*3/uL (ref 0.0–0.5)
Eosinophils Relative: 0 %
HCT: 34.7 % — ABNORMAL LOW (ref 39.0–52.0)
Hemoglobin: 10.5 g/dL — ABNORMAL LOW (ref 13.0–17.0)
Immature Granulocytes: 1 %
Lymphocytes Relative: 17 %
Lymphs Abs: 0.7 10*3/uL (ref 0.7–4.0)
MCH: 29.2 pg (ref 26.0–34.0)
MCHC: 30.3 g/dL (ref 30.0–36.0)
MCV: 96.4 fL (ref 80.0–100.0)
Monocytes Absolute: 0.5 10*3/uL (ref 0.1–1.0)
Monocytes Relative: 11 %
Neutro Abs: 3.1 10*3/uL (ref 1.7–7.7)
Neutrophils Relative %: 70 %
Platelets: 425 10*3/uL — ABNORMAL HIGH (ref 150–400)
RBC: 3.6 MIL/uL — ABNORMAL LOW (ref 4.22–5.81)
RDW: 16 % — ABNORMAL HIGH (ref 11.5–15.5)
WBC: 4.3 10*3/uL (ref 4.0–10.5)
nRBC: 0.5 % — ABNORMAL HIGH (ref 0.0–0.2)

## 2022-04-06 LAB — TROPONIN I (HIGH SENSITIVITY): Troponin I (High Sensitivity): 23 ng/L — ABNORMAL HIGH (ref <18)

## 2022-04-06 LAB — COMPREHENSIVE METABOLIC PANEL
ALT: 51 U/L — ABNORMAL HIGH (ref 0–44)
AST: 27 U/L (ref 15–41)
Albumin: 3.4 g/dL — ABNORMAL LOW (ref 3.5–5.0)
Alkaline Phosphatase: 117 U/L (ref 38–126)
Anion gap: 18 — ABNORMAL HIGH (ref 5–15)
BUN: 60 mg/dL — ABNORMAL HIGH (ref 8–23)
CO2: 21 mmol/L — ABNORMAL LOW (ref 22–32)
Calcium: 9.1 mg/dL (ref 8.9–10.3)
Chloride: 96 mmol/L — ABNORMAL LOW (ref 98–111)
Creatinine, Ser: 2.11 mg/dL — ABNORMAL HIGH (ref 0.61–1.24)
GFR, Estimated: 33 mL/min — ABNORMAL LOW (ref >60)
Glucose, Bld: 741 mg/dL (ref 70–99)
Potassium: 6.1 mmol/L — ABNORMAL HIGH (ref 3.5–5.1)
Sodium: 135 mmol/L (ref 135–145)
Total Bilirubin: 1.1 mg/dL (ref 0.3–1.2)
Total Protein: 9.6 g/dL — ABNORMAL HIGH (ref 6.5–8.1)

## 2022-04-06 LAB — BLOOD GAS, VENOUS
Acid-base deficit: 0.6 mmol/L (ref 0.0–2.0)
Bicarbonate: 27.3 mmol/L (ref 20.0–28.0)
O2 Saturation: 18.7 %
Patient temperature: 37
pCO2, Ven: 58 mmHg (ref 44–60)
pH, Ven: 7.28 (ref 7.25–7.43)
pO2, Ven: <31 mmHg — CL (ref 32–45)

## 2022-04-06 LAB — CBG MONITORING, ED: Glucose-Capillary: >600 mg/dL (ref 70–99)

## 2022-04-06 LAB — KAPPA/LAMBDA LIGHT CHAINS
Kappa free light chain: 20.3 mg/L — ABNORMAL HIGH (ref 3.3–19.4)
Kappa, lambda light chain ratio: 0.22 — ABNORMAL LOW (ref 0.26–1.65)
Lambda free light chains: 93.5 mg/L — ABNORMAL HIGH (ref 5.7–26.3)

## 2022-04-06 LAB — BETA-HYDROXYBUTYRIC ACID: Beta-Hydroxybutyric Acid: 4.26 mmol/L — ABNORMAL HIGH (ref 0.05–0.27)

## 2022-04-06 MED ORDER — SODIUM CHLORIDE 0.9 % IV BOLUS
1000.0000 mL | Freq: Once | INTRAVENOUS | Status: AC
  Administered 2022-04-06: 1000 mL via INTRAVENOUS

## 2022-04-06 NOTE — ED Triage Notes (Signed)
Pt presents via POV with complaints of syncopal episodes at home with associated dizziness. Denies hitting his head, no LOC. Pt has a hx of DM - endorses compliance with medications.  Denies CP or SOB.    CBG in triage - HIGH

## 2022-04-06 NOTE — ED Triage Notes (Signed)
First nurse note: pt to ER via ems from home.  Pt has reportedly fallen 3x between yesterday and today, and had an episode of emesis this morning. Pt denies any pain at this time, denies any blood thinners.  CBG HIGH with EMS.    EMS VS: 186/110, HR 95, 98% RA.

## 2022-04-07 ENCOUNTER — Encounter: Payer: Self-pay | Admitting: Oncology

## 2022-04-07 ENCOUNTER — Other Ambulatory Visit: Payer: Self-pay

## 2022-04-07 ENCOUNTER — Inpatient Hospital Stay
Admission: EM | Admit: 2022-04-07 | Discharge: 2022-04-08 | DRG: 637 | Disposition: A | Discharge status: Home Health | Payer: 59 | Attending: Internal Medicine | Admitting: Internal Medicine

## 2022-04-07 DIAGNOSIS — G9341 Metabolic encephalopathy: Secondary | ICD-10-CM | POA: Diagnosis present

## 2022-04-07 DIAGNOSIS — R531 Weakness: Secondary | ICD-10-CM | POA: Diagnosis not present

## 2022-04-07 DIAGNOSIS — D631 Anemia in chronic kidney disease: Secondary | ICD-10-CM | POA: Diagnosis present

## 2022-04-07 DIAGNOSIS — E1010 Type 1 diabetes mellitus with ketoacidosis without coma: Principal | ICD-10-CM | POA: Diagnosis present

## 2022-04-07 DIAGNOSIS — F1410 Cocaine abuse, uncomplicated: Secondary | ICD-10-CM | POA: Diagnosis present

## 2022-04-07 DIAGNOSIS — I10 Essential (primary) hypertension: Secondary | ICD-10-CM | POA: Diagnosis present

## 2022-04-07 DIAGNOSIS — N1831 Chronic kidney disease, stage 3a: Secondary | ICD-10-CM | POA: Diagnosis present

## 2022-04-07 DIAGNOSIS — E1022 Type 1 diabetes mellitus with diabetic chronic kidney disease: Secondary | ICD-10-CM | POA: Diagnosis present

## 2022-04-07 DIAGNOSIS — R55 Syncope and collapse: Secondary | ICD-10-CM | POA: Diagnosis present

## 2022-04-07 DIAGNOSIS — Z8249 Family history of ischemic heart disease and other diseases of the circulatory system: Secondary | ICD-10-CM | POA: Diagnosis not present

## 2022-04-07 DIAGNOSIS — E860 Dehydration: Secondary | ICD-10-CM

## 2022-04-07 DIAGNOSIS — N179 Acute kidney failure, unspecified: Secondary | ICD-10-CM | POA: Diagnosis present

## 2022-04-07 DIAGNOSIS — U071 COVID-19: Secondary | ICD-10-CM | POA: Diagnosis present

## 2022-04-07 DIAGNOSIS — E7800 Pure hypercholesterolemia, unspecified: Secondary | ICD-10-CM | POA: Diagnosis present

## 2022-04-07 DIAGNOSIS — Z7982 Long term (current) use of aspirin: Secondary | ICD-10-CM | POA: Diagnosis not present

## 2022-04-07 DIAGNOSIS — Z79899 Other long term (current) drug therapy: Secondary | ICD-10-CM | POA: Diagnosis not present

## 2022-04-07 DIAGNOSIS — Z794 Long term (current) use of insulin: Secondary | ICD-10-CM | POA: Diagnosis not present

## 2022-04-07 DIAGNOSIS — E10649 Type 1 diabetes mellitus with hypoglycemia without coma: Secondary | ICD-10-CM | POA: Diagnosis not present

## 2022-04-07 DIAGNOSIS — C9000 Multiple myeloma not having achieved remission: Secondary | ICD-10-CM | POA: Diagnosis present

## 2022-04-07 DIAGNOSIS — Z880 Allergy status to penicillin: Secondary | ICD-10-CM | POA: Diagnosis not present

## 2022-04-07 DIAGNOSIS — R42 Dizziness and giddiness: Secondary | ICD-10-CM | POA: Diagnosis present

## 2022-04-07 DIAGNOSIS — E875 Hyperkalemia: Secondary | ICD-10-CM | POA: Diagnosis present

## 2022-04-07 DIAGNOSIS — I129 Hypertensive chronic kidney disease with stage 1 through stage 4 chronic kidney disease, or unspecified chronic kidney disease: Secondary | ICD-10-CM | POA: Diagnosis present

## 2022-04-07 DIAGNOSIS — E1110 Type 2 diabetes mellitus with ketoacidosis without coma: Secondary | ICD-10-CM | POA: Insufficient documentation

## 2022-04-07 DIAGNOSIS — Z7902 Long term (current) use of antithrombotics/antiplatelets: Secondary | ICD-10-CM | POA: Diagnosis not present

## 2022-04-07 DIAGNOSIS — Z515 Encounter for palliative care: Secondary | ICD-10-CM | POA: Diagnosis not present

## 2022-04-07 LAB — CBG MONITORING, ED
Glucose-Capillary: >600 mg/dL (ref 70–99)
Glucose-Capillary: 426 mg/dL — ABNORMAL HIGH (ref 70–99)
Glucose-Capillary: >600 mg/dL (ref 70–99)
Glucose-Capillary: >600 mg/dL (ref 70–99)
Glucose-Capillary: 518 mg/dL (ref 70–99)
Glucose-Capillary: 550 mg/dL (ref 70–99)
Glucose-Capillary: 439 mg/dL — ABNORMAL HIGH (ref 70–99)
Glucose-Capillary: 506 mg/dL (ref 70–99)
Glucose-Capillary: 422 mg/dL — ABNORMAL HIGH (ref 70–99)
Glucose-Capillary: 299 mg/dL — ABNORMAL HIGH (ref 70–99)
Glucose-Capillary: 235 mg/dL — ABNORMAL HIGH (ref 70–99)
Glucose-Capillary: 194 mg/dL — ABNORMAL HIGH (ref 70–99)
Glucose-Capillary: 166 mg/dL — ABNORMAL HIGH (ref 70–99)
Glucose-Capillary: 172 mg/dL — ABNORMAL HIGH (ref 70–99)
Glucose-Capillary: 115 mg/dL — ABNORMAL HIGH (ref 70–99)
Glucose-Capillary: 135 mg/dL — ABNORMAL HIGH (ref 70–99)
Glucose-Capillary: 78 mg/dL (ref 70–99)
Glucose-Capillary: 19 mg/dL — CL (ref 70–99)
Glucose-Capillary: 36 mg/dL — CL (ref 70–99)
Glucose-Capillary: 130 mg/dL — ABNORMAL HIGH (ref 70–99)

## 2022-04-07 LAB — URINALYSIS, ROUTINE W REFLEX MICROSCOPIC
Bacteria, UA: NONE SEEN
Bilirubin Urine: NEGATIVE
Glucose, UA: >=500 mg/dL — AB
Hgb urine dipstick: NEGATIVE
Ketones, ur: 5 mg/dL — AB
Leukocytes,Ua: NEGATIVE
Nitrite: NEGATIVE
Protein, ur: NEGATIVE mg/dL
Specific Gravity, Urine: 1.022 (ref 1.005–1.030)
Squamous Epithelial / HPF: NONE SEEN /HPF (ref 0–5)
pH: 5 (ref 5.0–8.0)

## 2022-04-07 LAB — BASIC METABOLIC PANEL
Anion gap: 19 — ABNORMAL HIGH (ref 5–15)
Anion gap: 12 (ref 5–15)
Anion gap: 10 (ref 5–15)
BUN: 65 mg/dL — ABNORMAL HIGH (ref 8–23)
BUN: 61 mg/dL — ABNORMAL HIGH (ref 8–23)
BUN: 53 mg/dL — ABNORMAL HIGH (ref 8–23)
CO2: 19 mmol/L — ABNORMAL LOW (ref 22–32)
CO2: 23 mmol/L (ref 22–32)
CO2: 23 mmol/L (ref 22–32)
Calcium: 8.7 mg/dL — ABNORMAL LOW (ref 8.9–10.3)
Calcium: 8.5 mg/dL — ABNORMAL LOW (ref 8.9–10.3)
Calcium: 8.8 mg/dL — ABNORMAL LOW (ref 8.9–10.3)
Chloride: 100 mmol/L (ref 98–111)
Chloride: 106 mmol/L (ref 98–111)
Chloride: 110 mmol/L (ref 98–111)
Creatinine, Ser: 2.04 mg/dL — ABNORMAL HIGH (ref 0.61–1.24)
Creatinine, Ser: 1.93 mg/dL — ABNORMAL HIGH (ref 0.61–1.24)
Creatinine, Ser: 1.58 mg/dL — ABNORMAL HIGH (ref 0.61–1.24)
GFR, Estimated: 35 mL/min — ABNORMAL LOW (ref >60)
GFR, Estimated: 37 mL/min — ABNORMAL LOW (ref >60)
GFR, Estimated: 47 mL/min — ABNORMAL LOW (ref >60)
Glucose, Bld: 761 mg/dL (ref 70–99)
Glucose, Bld: 543 mg/dL (ref 70–99)
Glucose, Bld: 230 mg/dL — ABNORMAL HIGH (ref 70–99)
Potassium: 5.3 mmol/L — ABNORMAL HIGH (ref 3.5–5.1)
Potassium: 3.6 mmol/L (ref 3.5–5.1)
Potassium: 3.6 mmol/L (ref 3.5–5.1)
Sodium: 138 mmol/L (ref 135–145)
Sodium: 141 mmol/L (ref 135–145)
Sodium: 143 mmol/L (ref 135–145)

## 2022-04-07 LAB — RESP PANEL BY RT-PCR (RSV, FLU A&B, COVID)  RVPGX2
Influenza A by PCR: NEGATIVE
Influenza B by PCR: NEGATIVE
Resp Syncytial Virus by PCR: NEGATIVE
SARS Coronavirus 2 by RT PCR: POSITIVE — AB

## 2022-04-07 LAB — CBC
HCT: 28.8 % — ABNORMAL LOW (ref 39.0–52.0)
Hemoglobin: 8.8 g/dL — ABNORMAL LOW (ref 13.0–17.0)
MCH: 29.5 pg (ref 26.0–34.0)
MCHC: 30.6 g/dL (ref 30.0–36.0)
MCV: 96.6 fL (ref 80.0–100.0)
Platelets: 373 10*3/uL (ref 150–400)
RBC: 2.98 MIL/uL — ABNORMAL LOW (ref 4.22–5.81)
RDW: 16.2 % — ABNORMAL HIGH (ref 11.5–15.5)
WBC: 4 10*3/uL (ref 4.0–10.5)
nRBC: 0 % (ref 0.0–0.2)

## 2022-04-07 LAB — BETA-HYDROXYBUTYRIC ACID
Beta-Hydroxybutyric Acid: 6.24 mmol/L — ABNORMAL HIGH (ref 0.05–0.27)
Beta-Hydroxybutyric Acid: 0.08 mmol/L (ref 0.05–0.27)

## 2022-04-07 LAB — D-DIMER, QUANTITATIVE: D-Dimer, Quant: 1.44 ug/mL-FEU — ABNORMAL HIGH (ref 0.00–0.50)

## 2022-04-07 LAB — TROPONIN I (HIGH SENSITIVITY): Troponin I (High Sensitivity): 20 ng/L — ABNORMAL HIGH (ref <18)

## 2022-04-07 MED ORDER — INSULIN ASPART 100 UNIT/ML IJ SOLN: 0.0000 [IU] | Freq: Every day | INTRAMUSCULAR | Status: DC

## 2022-04-07 MED ORDER — INSULIN DETEMIR 100 UNIT/ML ~~LOC~~ SOLN
14.0000 [IU] | Freq: Every day | SUBCUTANEOUS | Status: DC
  Administered 2022-04-07: 14 [IU] via SUBCUTANEOUS
  Filled 2022-04-07 (×2): qty 0.14

## 2022-04-07 MED ORDER — ALBUTEROL SULFATE (2.5 MG/3ML) 0.083% IN NEBU
10.0000 mg | INHALATION_SOLUTION | Freq: Once | RESPIRATORY_TRACT | Status: AC
  Administered 2022-04-07: 10 mg via RESPIRATORY_TRACT
  Filled 2022-04-07: qty 12

## 2022-04-07 MED ORDER — INSULIN ASPART 100 UNIT/ML IJ SOLN
3.0000 [IU] | Freq: Three times a day (TID) | INTRAMUSCULAR | Status: DC
  Administered 2022-04-08: 3 [IU] via SUBCUTANEOUS
  Filled 2022-04-07 (×2): qty 1

## 2022-04-07 MED ORDER — GUAIFENESIN-DM 100-10 MG/5ML PO SYRP: 10.0000 mL | ORAL_SOLUTION | ORAL | Status: DC | PRN

## 2022-04-07 MED ORDER — LACTATED RINGERS IV BOLUS
20.0000 mL/kg | Freq: Once | INTRAVENOUS | Status: AC
  Administered 2022-04-07: 1402 mL via INTRAVENOUS

## 2022-04-07 MED ORDER — ENOXAPARIN SODIUM 40 MG/0.4ML IJ SOSY
40.0000 mg | PREFILLED_SYRINGE | INTRAMUSCULAR | Status: DC
  Administered 2022-04-07 – 2022-04-08 (×2): 40 mg via SUBCUTANEOUS
  Filled 2022-04-07 (×2): qty 0.4

## 2022-04-07 MED ORDER — INSULIN REGULAR(HUMAN) IN NACL 100-0.9 UT/100ML-% IV SOLN
INTRAVENOUS | Status: DC
  Administered 2022-04-07: 7.5 [IU]/h via INTRAVENOUS
  Filled 2022-04-07: qty 100

## 2022-04-07 MED ORDER — INSULIN ASPART 100 UNIT/ML IJ SOLN
0.0000 [IU] | Freq: Three times a day (TID) | INTRAMUSCULAR | Status: DC
  Administered 2022-04-08: 5 [IU] via SUBCUTANEOUS
  Administered 2022-04-08: 8 [IU] via SUBCUTANEOUS
  Administered 2022-04-08: 1 [IU] via SUBCUTANEOUS
  Filled 2022-04-07 (×3): qty 1

## 2022-04-07 MED ORDER — SODIUM CHLORIDE 0.9 % IV BOLUS
1000.0000 mL | Freq: Once | INTRAVENOUS | Status: AC
  Administered 2022-04-07: 1000 mL via INTRAVENOUS

## 2022-04-07 MED ORDER — ATORVASTATIN CALCIUM 20 MG PO TABS
80.0000 mg | ORAL_TABLET | Freq: Every day | ORAL | Status: DC
  Administered 2022-04-07 – 2022-04-08 (×2): 80 mg via ORAL
  Filled 2022-04-07 (×2): qty 4

## 2022-04-07 MED ORDER — VITAMIN C 500 MG PO TABS
500.0000 mg | ORAL_TABLET | Freq: Every day | ORAL | Status: DC
  Administered 2022-04-07 – 2022-04-08 (×2): 500 mg via ORAL
  Filled 2022-04-07 (×2): qty 1

## 2022-04-07 MED ORDER — POTASSIUM CHLORIDE 2 MEQ/ML IV SOLN
INTRAVENOUS | Status: DC
  Filled 2022-04-07 (×5): qty 1000

## 2022-04-07 MED ORDER — IPRATROPIUM-ALBUTEROL 20-100 MCG/ACT IN AERS
1.0000 | INHALATION_SPRAY | Freq: Four times a day (QID) | RESPIRATORY_TRACT | Status: DC
  Administered 2022-04-07 – 2022-04-08 (×6): 1 via RESPIRATORY_TRACT
  Filled 2022-04-07: qty 4

## 2022-04-07 MED ORDER — HYDROCOD POLI-CHLORPHE POLI ER 10-8 MG/5ML PO SUER
5.0000 mL | Freq: Two times a day (BID) | ORAL | Status: DC | PRN
  Administered 2022-04-07: 5 mL via ORAL
  Filled 2022-04-07: qty 5

## 2022-04-07 MED ORDER — KCL-LACTATED RINGERS 20 MEQ/L IV SOLN
INTRAVENOUS | Status: AC
  Filled 2022-04-07 (×2): qty 1000

## 2022-04-07 MED ORDER — ZINC SULFATE 220 (50 ZN) MG PO CAPS
220.0000 mg | ORAL_CAPSULE | Freq: Every day | ORAL | Status: DC
  Administered 2022-04-07 – 2022-04-08 (×2): 220 mg via ORAL
  Filled 2022-04-07 (×2): qty 1

## 2022-04-07 MED ORDER — FERROUS SULFATE 325 (65 FE) MG PO TABS
325.0000 mg | ORAL_TABLET | Freq: Every day | ORAL | Status: DC
  Administered 2022-04-07 – 2022-04-08 (×2): 325 mg via ORAL
  Filled 2022-04-07 (×2): qty 1

## 2022-04-07 MED ORDER — ASPIRIN 81 MG PO TBEC
81.0000 mg | DELAYED_RELEASE_TABLET | Freq: Every day | ORAL | Status: DC
  Administered 2022-04-07 – 2022-04-08 (×2): 81 mg via ORAL
  Filled 2022-04-07 (×2): qty 1

## 2022-04-07 MED ORDER — LENALIDOMIDE 10 MG PO CAPS: 10.0000 mg | ORAL_CAPSULE | Freq: Every day | ORAL | Status: DC

## 2022-04-07 MED ORDER — DEXTROSE IN LACTATED RINGERS 5 % IV SOLN: INTRAVENOUS | Status: DC

## 2022-04-07 MED ORDER — ADULT MULTIVITAMIN W/MINERALS CH
1.0000 | ORAL_TABLET | Freq: Every day | ORAL | Status: DC
  Administered 2022-04-07 – 2022-04-08 (×2): 1 via ORAL
  Filled 2022-04-07 (×2): qty 1

## 2022-04-07 MED ORDER — DEXTROSE 50 % IV SOLN: 0.0000 mL | INTRAVENOUS | Status: DC | PRN

## 2022-04-07 MED ORDER — SODIUM POLYSTYRENE SULFONATE 15 GM/60ML PO SUSP: 15.0000 g | Freq: Once | ORAL | Status: DC

## 2022-04-07 MED ORDER — FOLIC ACID 1 MG PO TABS: 1.0000 mg | ORAL_TABLET | Freq: Every day | ORAL | Status: DC

## 2022-04-07 MED ORDER — LACTATED RINGERS IV SOLN: INTRAVENOUS | Status: DC

## 2022-04-07 MED ORDER — NIRMATRELVIR/RITONAVIR (PAXLOVID) TABLET (RENAL DOSING)
2.0000 | ORAL_TABLET | Freq: Two times a day (BID) | ORAL | Status: DC
  Administered 2022-04-07 – 2022-04-08 (×3): 2 via ORAL
  Filled 2022-04-07: qty 20

## 2022-04-07 MED ORDER — DEXTROSE 50 % IV SOLN
0.0000 mL | INTRAVENOUS | Status: DC | PRN
  Administered 2022-04-07: 50 mL via INTRAVENOUS
  Filled 2022-04-07: qty 50

## 2022-04-07 NOTE — Hospital Course (Addendum)
Taken from H&P.   Nicholas Weiss is a 72 y.o. male with medical history significant for cocaine abuse, type 1 diabetes, multiple myeloma on Revlimid, CKD stage IIIa baseline creatinine 1.5-1.7 , hospitalized from 12/13 to 12/14 with symptomatic hypoglycemia, who presents to the ED with a 1 week history of generalized malaise, weakness and dizziness resulting in a few falls.  He denies cough or congestion, fever or chills and denies nausea, vomiting, abdominal pain or diarrhea has had no chest pain or palpitations.   ED course and data review: BP 148/110 with otherwise normal vitals labs with blood glucose 741 with anion gap of 18 venous pH of 7.28 and beta hydroxybutyric acid of 4.26.  Other labs significant for creatinine of 2.11 above baseline of 1.5, potassium 6.1.  Troponin 23, hemoglobin 10.5 which is his baseline.  Beta-hydroxybutyrate acid elevated at 4.26.  UA with glucose urea and mild ketonuria.  Respiratory panel positive for COVID-19 PCR EK G, personally viewed and interpreted showing sinus at 96 with no acute changes.   Chest x-ray clear. Patient given fluid boluses started on insulin infusion per Endo tool.  1/9: Hemodynamically stable, on room air.  CBG is still elevated.  Creatinine started improving, currently at 1.93.  Hyperkalemia resolved with potassium of 3.6 and Closed X 1.  CBC with hemoglobin decreased to 8.8, which is more close to his baseline, prior reading most likely hemoconcentration.  Recent anemia panel consistent with anemia of chronic disease with some iron deficiency.  Also starting him on iron supplement Starting him on Paxlovid with supportive care for COVID infection.  CBG improved and gap closed x 2, he is being transition to basal and short-acting.  1/10: Hemodynamically stable with CBG of 123 this morning, patient did develop hypoglycemia overnight after getting basal with CBG recorded as low as 19.  Improved with intervention and diet.  Decreasing Levemir to 8  units daily, patient was on 16 units at home.  Renal function improved.  Phosphorus low at 2.2 which is being repleted.  D-dimer improving.  CRP normal. CBG improved and patient able to tolerate diet well.  PT evaluated him and recommended home health services which were ordered.  Patient is being discharged on his home medications along with Paxlovid with instructions to hold Lipitor while taking Paxlovid.  He need to have a close follow-up with his endocrinologist and most likely will get benefit from getting continuous glucose monitor.  Patient also need a close follow-up with his oncologist for ongoing treatment for multiple myeloma.  Per oncology note patient is getting palliative management.  We also added low-dose losartan due to elevated blood pressure and his PCP can titrate as needed.  Patient will be high risk for deterioration and mortality based on life limiting underlying comorbidities.  He will continue with rest of his home medications and need to have a close follow-up with his providers for further recommendations.

## 2022-04-07 NOTE — Assessment & Plan Note (Signed)
COVID-19 infection can be contributory.  Not much upper respiratory symptoms and chest x-ray was clear. D-dimer elevated but patient has multiple myeloma and COVID-19, on room air, might have to rule out PE if worsening respiratory status. -Starting him on Paxlovid based on underlying comorbidities -Supportive care

## 2022-04-07 NOTE — Assessment & Plan Note (Addendum)
Gap closed twice and beta-hydroxybutyrate acid improving. Patient is being transitioned to basal and short-acting. -Levemir 14 units daily -Sensitive SSI -3 units with meal if he eats more than 50%

## 2022-04-07 NOTE — ED Provider Notes (Signed)
[facility] Provider Note    Event Date/Time   First MD Initiated Contact with Patient 04/07/22 [phone]     (approximate)   History   Near Syncope   HPI  Nicholas Weiss is a 72 y.o. male who presents to the ED for evaluation of Near Syncope   I review oncology visit from 1/5.  History of multiple myeloma, type 1 diabetes, CKD 3  Patient presents to the ED for evaluation of generalized weakness, dizziness and feeling thirsty over the past few days.  He is a poor historian.  His "old lady" called EMS because he was not feeling well.  He has trouble elaborating any further details beyond generalized malaise and requesting water.  Physical Exam   Triage Vital Signs: ED Triage Vitals  Enc Vitals Group     BP 04/06/22 2048 (!) 148/101     Pulse Rate 04/06/22 2048 92     Resp 04/06/22 2048 18     Temp 04/06/22 2048 98.1 F (36.7 C)     Temp Source 04/06/22 2048 Oral     SpO2 04/06/22 2048 100 %     Weight 04/06/22 2057 154 lb 9.6 oz (70.1 kg)     Height 04/06/22 2057 '5\' 10"'$  (1.778 m)     Head Circumference --      Peak Flow --      Pain Score 04/06/22 2047 0     Pain Loc --      Pain Edu? --      Excl. in [place]? --     Most recent vital signs: Vitals:   04/07/22 0044 04/07/22 0401  BP: (!) 126/101 (!) 149/72  Pulse: 90 83  Resp: 20 18  Temp:  98.4 F (36.9 C)  SpO2: 100% 99%    General: Awake, no distress.  Oriented to the year, location and situation. CV:  Good peripheral perfusion.  Resp:  Normal effort.  Abd:  No distention.  Soft and benign MSK:  No deformity noted.  Neuro:  No focal deficits appreciated. Other:     ED Results / Procedures / Treatments   Labs (all labs ordered are listed, but only abnormal results are displayed) Labs Reviewed  RESP PANEL BY RT-PCR (RSV, FLU A&B, COVID)  RVPGX2 - Abnormal; Notable for the following components:      Result Value   SARS Coronavirus 2 by RT PCR POSITIVE (*)    All other components  within normal limits  CBC WITH DIFFERENTIAL/PLATELET - Abnormal; Notable for the following components:   RBC 3.60 (*)    Hemoglobin 10.5 (*)    HCT 34.7 (*)    RDW 16.0 (*)    Platelets 425 (*)    nRBC 0.5 (*)    All other components within normal limits  COMPREHENSIVE METABOLIC PANEL - Abnormal; Notable for the following components:   Potassium 6.1 (*)    Chloride 96 (*)    CO2 21 (*)    Glucose, Bld 741 (*)    BUN 60 (*)    Creatinine, Ser 2.11 (*)    Total Protein 9.6 (*)    Albumin 3.4 (*)    ALT 51 (*)    GFR, Estimated 33 (*)    Anion gap 18 (*)    All other components within normal limits  BETA-HYDROXYBUTYRIC ACID - Abnormal; Notable for the following components:   Beta-Hydroxybutyric Acid 4.26 (*)    All other components within normal limits  BLOOD GAS,  VENOUS - Abnormal; Notable for the following components:   pO2, Ven <31 (*)    All other components within normal limits  BASIC METABOLIC PANEL - Abnormal; Notable for the following components:   Potassium 5.3 (*)    CO2 19 (*)    Glucose, Bld 761 (*)    BUN 65 (*)    Creatinine, Ser 2.04 (*)    Calcium 8.7 (*)    GFR, Estimated 35 (*)    Anion gap 19 (*)    All other components within normal limits  CBG MONITORING, ED - Abnormal; Notable for the following components:   Glucose-Capillary >600 (*)    All other components within normal limits  CBG MONITORING, ED - Abnormal; Notable for the following components:   Glucose-Capillary >600 (*)    All other components within normal limits  CBG MONITORING, ED - Abnormal; Notable for the following components:   Glucose-Capillary 426 (*)    All other components within normal limits  CBG MONITORING, ED - Abnormal; Notable for the following components:   Glucose-Capillary >600 (*)    All other components within normal limits  CBG MONITORING, ED - Abnormal; Notable for the following components:   Glucose-Capillary >600 (*)    All other components within normal limits   TROPONIN I (HIGH SENSITIVITY) - Abnormal; Notable for the following components:   Troponin I (High Sensitivity) 23 (*)    All other components within normal limits  URINALYSIS, ROUTINE W REFLEX MICROSCOPIC  BASIC METABOLIC PANEL  BASIC METABOLIC PANEL  BASIC METABOLIC PANEL  BASIC METABOLIC PANEL  BETA-HYDROXYBUTYRIC ACID  BETA-HYDROXYBUTYRIC ACID  BETA-HYDROXYBUTYRIC ACID  CBC  TROPONIN I (HIGH SENSITIVITY)    EKG Sinus versus junctional rhythm with a rate of 96 bpm.  Prolonged QTc.  No clear STEMI.  RADIOLOGY CXR interpreted by me without evidence of acute cardiopulmonary pathology.  Official radiology report(s): DG Chest Port 1 View  Result Date: 04/06/2022 CLINICAL DATA:  86247 Dizziness 86247 EXAM: PORTABLE CHEST - 1 VIEW COMPARISON:  02/24/2022. FINDINGS: Cardiac silhouette is unremarkable. No pneumothorax or pleural effusion. The lungs are clear. Aorta is calcified. The visualized skeletal structures are unremarkable. IMPRESSION: No acute cardiopulmonary process. Electronically Signed   By: Sammie Bench M.D.   On: 04/06/2022 21:16    PROCEDURES and INTERVENTIONS:  .Critical Care  Performed by: Vladimir Crofts, MD Authorized by: Vladimir Crofts, MD   Critical care provider statement:    Critical care time (minutes):  30   Critical care time was exclusive of:  Separately billable procedures and treating other patients   Critical care was necessary to treat or prevent imminent or life-threatening deterioration of the following conditions:  Metabolic crisis, dehydration and endocrine crisis   Critical care was time spent personally by me on the following activities:  Development of treatment plan with patient or surrogate, discussions with consultants, evaluation of patient's response to treatment, examination of patient, ordering and review of laboratory studies, ordering and review of radiographic studies, ordering and performing treatments and interventions, pulse oximetry,  re-evaluation of patient's condition and review of old charts   Medications  insulin regular, human (MYXREDLIN) 100 units/ 100 mL infusion (7.5 Units/hr Intravenous New Bag/Given 04/07/22 0359)  lactated ringers infusion ( Intravenous New Bag/Given 04/07/22 0359)  dextrose 5 % in lactated ringers infusion (0 mLs Intravenous Hold 04/07/22 0406)  dextrose 50 % solution 0-50 mL (has no administration in time range)  atorvastatin (LIPITOR) tablet 80 mg (has no administration in time range)  aspirin EC tablet 81 mg (has no administration in time range)  enoxaparin (LOVENOX) injection 40 mg (has no administration in time range)  dextrose 5 % in lactated ringers infusion (has no administration in time range)  dextrose 50 % solution 0-50 mL (has no administration in time range)  sodium polystyrene (KAYEXALATE) 15 GM/60ML suspension 15 g (15 g Oral Not Given 04/07/22 0505)  lenalidomide (REVLIMID) capsule 10 mg (has no administration in time range)  sodium chloride 0.9 % bolus 1,000 mL (0 mLs Intravenous Stopped 04/07/22 0015)  sodium chloride 0.9 % bolus 1,000 mL (0 mLs Intravenous Stopped 04/07/22 0333)  lactated ringers bolus 1,402 mL (1,402 mLs Intravenous New Bag/Given 04/07/22 0501)     IMPRESSION / MDM / ASSESSMENT AND PLAN / ED COURSE  I reviewed the triage vital signs and the nursing notes.  Differential diagnosis includes, but is not limited to, DKA, HHS, AKI, sepsis  {Patient presents with symptoms of an acute illness or injury that is potentially life-threatening.  72 year old presents with generalized malaise with evidence of DKA requiring insulin drip and medical admission.  He has a small anion gap, elevated beta-hydroxybutyrate acid.  Noted to test positive for COVID-19 as a possible precipitator of his metabolic derangements.  AKI that I suspect is prerenal and he received his fluid bolus prior to the insulin drip initiation per protocol.  Admitted to medicine  Clinical Course as of 04/07/22  0510  Tue Apr 07, 2022  0434 I consult with hospitalist who agrees to admit [DS]    Clinical Course User Index [DS] Vladimir Crofts, MD     FINAL CLINICAL IMPRESSION(S) / ED DIAGNOSES   Final diagnoses:  Diabetic ketoacidosis without coma associated with type 1 diabetes mellitus ([place])  Dehydration  Generalized weakness     Rx / DC Orders   ED Discharge Orders     None        Note:  This document was prepared using Dragon voice recognition software and may include unintentional dictation errors.   Vladimir Crofts, MD 04/07/22 [phone]

## 2022-04-07 NOTE — ED Notes (Signed)
Report to [place], [place]

## 2022-04-07 NOTE — Assessment & Plan Note (Addendum)
Resolved. Potassium 6.1 on admission secondary to DKA with AKI

## 2022-04-07 NOTE — Assessment & Plan Note (Addendum)
Currently on Revlimid -Being managed by oncology as outpatient

## 2022-04-07 NOTE — Assessment & Plan Note (Addendum)
Improving. Creatinine 2.11>>1.58 up from baseline of 1.3-1.4 -Continue IV hydration Continue to monitor.  Avoid nephrotoxins

## 2022-04-07 NOTE — Assessment & Plan Note (Signed)
Not currently on antihypertensives

## 2022-04-07 NOTE — Assessment & Plan Note (Addendum)
Improving.  Most likely was secondary to DKA

## 2022-04-07 NOTE — Progress Notes (Signed)
Progress Note   Patient: Nicholas Weiss:[id number] DOB: 29-May-1950 DOA: 04/07/2022     0 DOS: the patient was seen and examined on 04/07/2022   Brief hospital course: Taken from H&P.   TYSON PARKISON is a 72 y.o. male with medical history significant for cocaine abuse, type 1 diabetes, multiple myeloma on Revlimid, CKD stage IIIa baseline creatinine 1.5-1.7 , hospitalized from 12/13 to 12/14 with symptomatic hypoglycemia, who presents to the ED with a 1 week history of generalized malaise, weakness and dizziness resulting in a few falls.  He denies cough or congestion, fever or chills and denies nausea, vomiting, abdominal pain or diarrhea has had no chest pain or palpitations.   ED course and data review: BP 148/110 with otherwise normal vitals labs with blood glucose 741 with anion gap of 18 venous pH of 7.28 and beta hydroxybutyric acid of 4.26.  Other labs significant for creatinine of 2.11 above baseline of 1.5, potassium 6.1.  Troponin 23, hemoglobin 10.5 which is his baseline.  Beta-hydroxybutyrate acid elevated at 4.26.  UA with glucose urea and mild ketonuria.  Respiratory panel positive for COVID-19 PCR EK G, personally viewed and interpreted showing sinus at 96 with no acute changes.   Chest x-ray clear. Patient given fluid boluses started on insulin infusion per Endo tool.  1/9: Hemodynamically stable, on room air.  CBG is still elevated.  Creatinine started improving, currently at 1.93.  Hyperkalemia resolved with potassium of 3.6 and Closed X 1.  CBC with hemoglobin decreased to 8.8, which is more close to his baseline, prior reading most likely hemoconcentration.  Recent anemia panel consistent with anemia of chronic disease with some iron deficiency.  Also starting him on iron supplement Starting him on Paxlovid with supportive care for COVID infection.  CBG improved and gap closed x 2, he is being transition to basal and short-acting.    Assessment and Plan: * Diabetic  ketoacidosis without coma associated with type 1 diabetes mellitus ([place]) Gap closed twice and beta-hydroxybutyrate acid improving. Patient is being transitioned to basal and short-acting. -Levemir 14 units daily -Sensitive SSI -3 units with meal if he eats more than 29%  Acute metabolic encephalopathy Improving.  Most likely was secondary to DKA  COVID-19 virus infection COVID-19 infection can be contributory.  Not much upper respiratory symptoms and chest x-ray was clear. D-dimer elevated but patient has multiple myeloma and COVID-19, on room air, might have to rule out PE if worsening respiratory status. -Starting him on Paxlovid based on underlying comorbidities -Supportive care  Hyperkalemia Resolved. Potassium 6.1 on admission secondary to DKA with AKI   Essential (primary) hypertension Not currently on antihypertensives  Acute renal failure superimposed on stage 3a chronic kidney disease ([place]) Improving. Creatinine 2.11>>1.58 up from baseline of 1.3-1.4 -Continue IV hydration Continue to monitor.  Avoid nephrotoxins  Multiple myeloma ([facility]) Currently on Revlimid -Being managed by oncology as outpatient        Subjective: Patient was little somnolent but easily arousable.  Denies any complaints.  No upper respiratory symptoms or known sick contacts.  Physical Exam: Vitals:   04/07/22 1130 04/07/22 1230 04/07/22 1300 04/07/22 1413  BP: (!) 164/94 (!) 156/94 (!) 156/80   Pulse: 79 72 79   Resp: '16 17 15   '$ Temp:    97.8 F (36.6 C)  TempSrc:      SpO2: 98% 98% 98%   Weight:      Height:       General.  Ill-appearing  gentleman, in no acute distress. Pulmonary.  Lungs clear bilaterally, normal respiratory effort. CV.  Regular rate and rhythm, no JVD, rub or murmur. Abdomen.  Soft, nontender, nondistended, BS positive. CNS.  Somnolent but arousable.  No focal neurologic deficit. Extremities.  No edema, no cyanosis, pulses intact and symmetrical.  Data  Reviewed: Prior data reviewed  Family Communication:   Disposition: Status is: Inpatient Remains inpatient appropriate because: Severity of illness  Planned Discharge Destination: Home  DVT prophylaxis.  Lovenox Time spent:  minutes  This record has been created using Systems analyst. Errors have been sought and corrected,but may not always be located. Such creation errors do not reflect on the standard of care.   Author: Lorella Nimrod, MD 04/07/2022 2:34 PM  For on call review [email or web address].

## 2022-04-07 NOTE — ED Notes (Signed)
Report given and care endorsed to oncoming shift

## 2022-04-07 NOTE — H&P (Signed)
History and Physical    Patient: Nicholas Weiss IWO:[id number] DOB: 04/19/1950 DOA: 04/07/2022 DOS: the patient was seen and examined on 04/07/2022 PCP: Dion Body, MD  Patient coming from: Home  Chief Complaint:  Chief Complaint  Patient presents with   Near Syncope    HPI: Nicholas Weiss is a 72 y.o. male with medical history significant for cocaine abuse, type 1 diabetes, multiple myeloma on Revlimid, CKD stage IIIa baseline creatinine 1.5-1.7 , hospitalized from 12/13 to 12/14 with symptomatic hypoglycemia, who presents to the ED with a 1 week history of generalized malaise, weakness and dizziness resulting in a few falls.  He denies cough or congestion, fever or chills and denies nausea, vomiting, abdominal pain or diarrhea has had no chest pain or palpitations.  Denies headache or visual disturbance or numbness weakness or tingling. ED course and data review: BP 148/110 with otherwise normal vitals labs with blood glucose 741 with anion gap of 18 venous pH of 7.28 and beta hydroxybutyric acid of 4.26.  Other labs significant for creatinine of 2.11 above baseline of 1.5, potassium 6.1.  Troponin 23, hemoglobin 10.5 which is his baseline.  Respiratory viral panel pending, as well as urinalysis.EK G, personally viewed and interpreted showing sinus at 96 with no acute changes.  Chest x-ray clear. Patient given fluid boluses started on insulin infusion per Endo tool and hospitalist consulted for admission for DKA.   Review of Systems: As mentioned in the history of present illness. All other systems reviewed and are negative.  Past Medical History:  Diagnosis Date   DKA (diabetic ketoacidoses) 04/06/2016   Hypercholesteremia    Hypertension    Past Surgical History:  Procedure Laterality Date   COLONOSCOPY WITH PROPOFOL N/A 02/01/2020   Procedure: COLONOSCOPY WITH PROPOFOL;  Surgeon: Lin Landsman, MD;  Location: [facility] ENDOSCOPY;  Service: Gastroenterology;  Laterality: N/A;    ESOPHAGOGASTRODUODENOSCOPY  02/01/2020   Procedure: ESOPHAGOGASTRODUODENOSCOPY (EGD);  Surgeon: Lin Landsman, MD;  Location: [facility] ENDOSCOPY;  Service: Gastroenterology;;   KNEE SURGERY Right    Torn meniscus   KNEE SURGERY Left    Social History:  reports that he has never smoked. He has never used smokeless tobacco. He reports that he does not currently use alcohol. He reports that he does not currently use drugs after having used the following drugs: Marijuana and "Crack" cocaine.  Allergies  Allergen Reactions   Penicillins Other (See Comments)    Childhood allergy Unknown reaction Has patient had a PCN reaction causing immediate rash, facial/tongue/throat swelling, SOB or lightheadedness with hypotension: No Has patient had a PCN reaction causing severe rash involving mucus membranes or skin necrosis: No Has patient had a PCN reaction that required hospitalization No Has patient had a PCN reaction occurring within the last 10 years: No If all of the above answers are "NO", then may proceed with Cephalosporin use.     Family History  Problem Relation Age of Onset   Heart attack Father    Hypertension Sister    Cancer Sister     Prior to Admission medications   Medication Sig Start Date End Date Taking? Authorizing Provider  acetaminophen (TYLENOL) 500 MG tablet Take 1,000 mg by mouth daily as needed for mild pain or moderate pain.    [provider]  acyclovir (ZOVIRAX) 400 MG tablet Take 400 mg by mouth in the morning. Patient not taking: Reported on 03/10/2022    [provider]  aspirin EC 81 MG tablet Take 81  mg by mouth in the morning.    [provider]  atorvastatin (LIPITOR) 80 MG tablet Take 1 tablet (80 mg total) by mouth at bedtime. Patient taking differently: Take 80 mg by mouth in the morning. 08/11/21   Shawna Clamp, MD  Baclofen 5 MG TABS Take 5 mg by mouth daily as needed (muscle spasm).    [provider]   clopidogrel (PLAVIX) 75 MG tablet Take 75 mg by mouth in the morning. 12/03/21   [provider]  dicyclomine (BENTYL) 20 MG tablet Take 1 tablet (20 mg total) by mouth 3 (three) times daily with meals as needed for spasms. Patient not taking: Reported on 04/03/2022 12/09/21 03/06/22  Sidney Ace, MD  docusate sodium (COLACE) 100 MG capsule Take 100 mg by mouth daily as needed for mild constipation.    [provider]  FARXIGA 10 MG TABS tablet Take 10 mg by mouth in the morning. 05/05/21   [provider]  gabapentin (NEURONTIN) 300 MG capsule Take 300-600 mg by mouth See admin instructions. 300 mg in the morning, 600 mg at night. 04/04/21   [provider]  insulin degludec (TRESIBA FLEXTOUCH) 100 UNIT/ML FlexTouch Pen Inject 15 Units into the skin daily. 03/12/22   Wyvonnia Dusky, MD  insulin lispro (HUMALOG) 100 UNIT/ML KwikPen Inject into the skin. Patient not taking: Reported on 04/03/2022 03/18/22   [provider]  lenalidomide (REVLIMID) 10 MG capsule Take 1 capsule (10 mg total) by mouth daily. Take 10 mg daily for 14 days and then off 7 days ( start the meds on 04/03/2022) obtained1/03/2022, REMS: [id number] 03/30/22   Sindy Guadeloupe, MD  NOVOLOG 100 UNIT/ML injection Inject 0-9 Units into the skin 3 (three) times daily with meals. 03/12/22   Wyvonnia Dusky, MD  ondansetron (ZOFRAN) 8 MG tablet Take 8 mg by mouth daily as needed for nausea or vomiting.    [provider]  prochlorperazine (COMPAZINE) 10 MG tablet Take 10 mg by mouth daily as needed for nausea or vomiting.    [provider]    Physical Exam: Vitals:   04/06/22 2048 04/06/22 2057 04/07/22 0044 04/07/22 0401  BP: (!) 148/101  (!) 126/101 (!) 149/72  Pulse: 92  90 83  Resp: '18  20 18  '$ Temp: 98.1 F (36.7 C)   98.4 F (36.9 C)  TempSrc: Oral   Oral  SpO2: 100%  100% 99%  Weight:  70.1 kg    Height:  '5\' 10"'$  (1.778 m)     Physical Exam Vitals and  nursing note reviewed.  Constitutional:      General: He is not in acute distress.    Appearance: He is ill-appearing.  HENT:     Head: Normocephalic and atraumatic.  Cardiovascular:     Rate and Rhythm: Normal rate and regular rhythm.     Heart sounds: Normal heart sounds.  Pulmonary:     Effort: Pulmonary effort is normal.     Breath sounds: Normal breath sounds.  Abdominal:     Palpations: Abdomen is soft.     Tenderness: There is no abdominal tenderness.  Neurological:     Mental Status: Mental status is at baseline. He is lethargic.     Labs on Admission: I have personally reviewed following labs and imaging studies  CBC: Recent Labs  Lab 04/03/22 1410 04/06/22 2053  WBC 3.2* 4.3  NEUTROABS 1.4* 3.1  HGB 8.1* 10.5*  HCT 25.4* 34.7*  MCV 92.7  96.4  PLT 433* 790*   Basic Metabolic Panel: Recent Labs  Lab 04/03/22 1410 04/06/22 2053  NA 134* 135  K 5.0 6.1*  CL 98 96*  CO2 26 21*  GLUCOSE 218* 741*  BUN 19 60*  CREATININE 1.25* 2.11*  CALCIUM 8.1* 9.1   GFR: Estimated Creatinine Clearance: 32.8 mL/min (A) (by C-G formula based on SCr of 2.11 mg/dL (H)). Liver Function Tests: Recent Labs  Lab 04/03/22 1410 04/06/22 2053  AST 19 27  ALT 23 51*  ALKPHOS 91 117  BILITOT 0.4 1.1  PROT 8.6* 9.6*  ALBUMIN 3.1* 3.4*   No results for input(s): "LIPASE", "AMYLASE" in the last 168 hours. No results for input(s): "AMMONIA" in the last 168 hours. Coagulation Profile: No results for input(s): "INR", "PROTIME" in the last 168 hours. Cardiac Enzymes: No results for input(s): "CKTOTAL", "CKMB", "CKMBINDEX", "TROPONINI" in the last 168 hours. BNP (last 3 results) No results for input(s): "PROBNP" in the last 8760 hours. HbA1C: No results for input(s): "HGBA1C" in the last 72 hours. CBG: Recent Labs  Lab 04/06/22 2047 04/07/22 0200 04/07/22 0352 04/07/22 0427  GLUCAP >600* >600* 426* >600*   Lipid Profile: No results for input(s): "CHOL", "HDL",  "LDLCALC", "TRIG", "CHOLHDL", "LDLDIRECT" in the last 72 hours. Thyroid Function Tests: No results for input(s): "TSH", "T4TOTAL", "FREET4", "T3FREE", "THYROIDAB" in the last 72 hours. Anemia Panel: No results for input(s): "VITAMINB12", "FOLATE", "FERRITIN", "TIBC", "IRON", "RETICCTPCT" in the last 72 hours. Urine analysis:    Component Value Date/Time   COLORURINE YELLOW (A) 02/17/2022 2240   APPEARANCEUR CLOUDY (A) 02/17/2022 2240   LABSPEC 1.015 02/17/2022 2240   PHURINE 7.0 02/17/2022 2240   GLUCOSEU >=500 (A) 02/17/2022 2240   HGBUR MODERATE (A) 02/17/2022 2240   BILIRUBINUR NEGATIVE 02/17/2022 2240   KETONESUR NEGATIVE 02/17/2022 2240   PROTEINUR 100 (A) 02/17/2022 2240   NITRITE NEGATIVE 02/17/2022 2240   LEUKOCYTESUR SMALL (A) 02/17/2022 2240    Radiological Exams on Admission: DG Chest Port 1 View  Result Date: 04/06/2022 CLINICAL DATA:  86247 Dizziness 86247 EXAM: PORTABLE CHEST - 1 VIEW COMPARISON:  02/24/2022. FINDINGS: Cardiac silhouette is unremarkable. No pneumothorax or pleural effusion. The lungs are clear. Aorta is calcified. The visualized skeletal structures are unremarkable. IMPRESSION: No acute cardiopulmonary process. Electronically Signed   By: Sammie Bench M.D.   On: 04/06/2022 21:16     Data Reviewed: Relevant notes from primary care and specialist visits, past discharge summaries as available in EHR, including Care Everywhere. Prior diagnostic testing as pertinent to current admission diagnoses Updated medications and problem lists for reconciliation ED course, including vitals, labs, imaging, treatment and response to treatment Triage notes, nursing and pharmacy notes and ED provider's notes Notable results as noted in HPI   Assessment and Plan: * DKA, type 1 ([place]) Continue insulin infusion, IV hydration, potassium repletion per Endo tool Transition to subcutaneous insulin when gap has closed  Acute metabolic encephalopathy Patient very  lethargic on exam, secondary to DKA Will keep n.p.o. Neurologic checks  Acute renal failure superimposed on stage 3a chronic kidney disease ([place]) Creatinine 2.11 up from baseline Expecting improvement with IV hydration Continue to monitor.  Avoid nephrotoxins  Hyperkalemia Potassium 6.1 secondary to DKA with AKI Expecting to correct with treatment of DKA however  Will give  albuterol nebulizer to help with hyperkalemia Continuous cardiac monitoring   Multiple myeloma (HCC) Currently on Revlimid  Essential (primary) hypertension Not currently on antihypertensives      DVT prophylaxis:  Lovenox  Consults: none  Advance Care Planning:   Code Status: Prior   Family Communication: none  Disposition Plan: Back to previous home environment  Severity of Illness: The appropriate patient status for this patient is INPATIENT. Inpatient status is judged to be reasonable and necessary in order to provide the required intensity of service to ensure the patient's safety. The patient's presenting symptoms, physical exam findings, and initial radiographic and laboratory data in the context of their chronic comorbidities is felt to place them at high risk for further clinical deterioration. Furthermore, it is not anticipated that the patient will be medically stable for discharge from the hospital within 2 midnights of admission.   * I certify that at the point of admission it is my clinical judgment that the patient will require inpatient hospital care spanning beyond 2 midnights from the point of admission due to high intensity of service, high risk for further deterioration and high frequency of surveillance required.*  Author: Athena Masse, MD 04/07/2022 4:42 AM  For on call review [email or web address].

## 2022-04-07 NOTE — ED Notes (Signed)
Long acting insulin administered - will transition off of insulin drip in 2hrs per orders.

## 2022-04-07 NOTE — Progress Notes (Signed)
Inpatient Diabetes Program Recommendations  AACE/ADA: New Consensus Statement on Inpatient Glycemic Control (2015)  Target Ranges:  Prepandial:   less than 140 mg/dL      Peak postprandial:   less than 180 mg/dL (1-2 hours)      Critically ill patients:  140 - 180 mg/dL   Lab Results  Component Value Date   GLUCAP 422 (H) 04/07/2022   HGBA1C 9.1 (H) 03/09/2022    Review of Glycemic Control  Latest Reference Range & Units 04/07/22 06:03 04/07/22 06:38 04/07/22 07:20 04/07/22 07:53  Glucose-Capillary 70 - 99 mg/dL 518 (HH) 506 (HH) 439 (H) 422 (H)   Diabetes history: DM 1 Outpatient Diabetes medications:  Tresiba 15 units daily IF EATING (instructions for patient from Dr. Gabriel Carina) Take 3 unit of NovoLog if your sugar is 80 - 120 Take 5 unit of NovoLog if your sugar is 121-170 Take 7 units of NovoLog if your sugar is 171-220 Take 9 units of NovoLog if your sugar is 221-270 Take 10 units of NovoLog if your sugar is 271 or higher  IF NOT EATING (AND YOU HAVE NOT TAKEN NOVOLOG IN THE LAST 3 HOURS) Take 2 units of NovoLog if your sugar is 201-250 Take 4 units of NovoLog if your sugar is 251-300 Take 6 units of NovoLog if your sugar is 301 or higher  Current orders for Inpatient glycemic control:  IV insulin/DKA orders  Inpatient Diabetes Program Recommendations:    Note admit with hyperglycemia/DKA.  Of note patient did receive Decadron 40 mg on 04/03/22 for prior to treatment for multiple myeloma which likely affected blood sugars.   Once patient is ready for transition off the insulin drip, recommend Semglee 14 units 2 hours prior to d/c of insulin drip. Also recommend, "very sensitive" Novolog correction tid with meals and Novolog 3 units tid with meals (meal coverage-hold if patient eats less than 50% or NPO).   Thanks,  Adah Perl, RN, BC-ADM Inpatient Diabetes Coordinator Pager [phone]  (8a-5p)

## 2022-04-07 NOTE — ED Notes (Signed)
BG 78. Pt is currently awake, alert, and oriented x4. He is eating dinner at this time

## 2022-04-07 NOTE — ED Notes (Signed)
Pt appears drowsy. Awakens to loud voice and is able to answer orientation questions.   VSS at this time. Cardiac monitor in place. BG done and documented. Insulin infusing at 7.5 with maintenance fluids going in at 178m/hr. Pt voices no needs at this time.

## 2022-04-08 ENCOUNTER — Other Ambulatory Visit: Payer: Self-pay

## 2022-04-08 ENCOUNTER — Encounter: Payer: Self-pay | Admitting: Internal Medicine

## 2022-04-08 ENCOUNTER — Other Ambulatory Visit: Payer: Self-pay | Admitting: Oncology

## 2022-04-08 DIAGNOSIS — U071 COVID-19: Secondary | ICD-10-CM

## 2022-04-08 DIAGNOSIS — C9000 Multiple myeloma not having achieved remission: Secondary | ICD-10-CM

## 2022-04-08 DIAGNOSIS — R531 Weakness: Secondary | ICD-10-CM

## 2022-04-08 DIAGNOSIS — E860 Dehydration: Secondary | ICD-10-CM

## 2022-04-08 LAB — MULTIPLE MYELOMA PANEL, SERUM
Albumin SerPl Elph-Mcnc: 3 g/dL (ref 2.9–4.4)
Albumin/Glob SerPl: 0.7 (ref 0.7–1.7)
Alpha 1: 0.4 g/dL (ref 0.0–0.4)
Alpha2 Glob SerPl Elph-Mcnc: 1.1 g/dL — ABNORMAL HIGH (ref 0.4–1.0)
B-Globulin SerPl Elph-Mcnc: 0.9 g/dL (ref 0.7–1.3)
Gamma Glob SerPl Elph-Mcnc: 2.5 g/dL — ABNORMAL HIGH (ref 0.4–1.8)
Globulin, Total: 4.8 g/dL — ABNORMAL HIGH (ref 2.2–3.9)
IgA: 105 mg/dL (ref 61–437)
IgG (Immunoglobin G), Serum: 3172 mg/dL — ABNORMAL HIGH (ref 603–1613)
IgM (Immunoglobulin M), Srm: 7 mg/dL — ABNORMAL LOW (ref 20–172)
M Protein SerPl Elph-Mcnc: 2.1 g/dL — ABNORMAL HIGH
Total Protein ELP: 7.8 g/dL (ref 6.0–8.5)

## 2022-04-08 LAB — GLUCOSE, CAPILLARY: Glucose-Capillary: 253 mg/dL — ABNORMAL HIGH (ref 70–99)

## 2022-04-08 LAB — CBC
HCT: 29.5 % — ABNORMAL LOW (ref 39.0–52.0)
Hemoglobin: 9.1 g/dL — ABNORMAL LOW (ref 13.0–17.0)
MCH: 29.4 pg (ref 26.0–34.0)
MCHC: 30.8 g/dL (ref 30.0–36.0)
MCV: 95.2 fL (ref 80.0–100.0)
Platelets: 344 10*3/uL (ref 150–400)
RBC: 3.1 MIL/uL — ABNORMAL LOW (ref 4.22–5.81)
RDW: 16.7 % — ABNORMAL HIGH (ref 11.5–15.5)
WBC: 4.8 10*3/uL (ref 4.0–10.5)
nRBC: 0.4 % — ABNORMAL HIGH (ref 0.0–0.2)

## 2022-04-08 LAB — COMPREHENSIVE METABOLIC PANEL
ALT: 28 U/L (ref 0–44)
AST: 16 U/L (ref 15–41)
Albumin: 2.7 g/dL — ABNORMAL LOW (ref 3.5–5.0)
Alkaline Phosphatase: 82 U/L (ref 38–126)
Anion gap: 5 (ref 5–15)
BUN: 33 mg/dL — ABNORMAL HIGH (ref 8–23)
CO2: 29 mmol/L (ref 22–32)
Calcium: 8.5 mg/dL — ABNORMAL LOW (ref 8.9–10.3)
Chloride: 107 mmol/L (ref 98–111)
Creatinine, Ser: 1.17 mg/dL (ref 0.61–1.24)
GFR, Estimated: >60 mL/min (ref >60)
Glucose, Bld: 112 mg/dL — ABNORMAL HIGH (ref 70–99)
Potassium: 4.5 mmol/L (ref 3.5–5.1)
Sodium: 141 mmol/L (ref 135–145)
Total Bilirubin: 0.7 mg/dL (ref 0.3–1.2)
Total Protein: 7 g/dL (ref 6.5–8.1)

## 2022-04-08 LAB — CBG MONITORING, ED
Glucose-Capillary: 49 mg/dL — ABNORMAL LOW (ref 70–99)
Glucose-Capillary: 49 mg/dL — ABNORMAL LOW (ref 70–99)
Glucose-Capillary: 71 mg/dL (ref 70–99)
Glucose-Capillary: 123 mg/dL — ABNORMAL HIGH (ref 70–99)
Glucose-Capillary: 300 mg/dL — ABNORMAL HIGH (ref 70–99)
Glucose-Capillary: 269 mg/dL — ABNORMAL HIGH (ref 70–99)
Glucose-Capillary: 217 mg/dL — ABNORMAL HIGH (ref 70–99)

## 2022-04-08 LAB — MAGNESIUM: Magnesium: 1.9 mg/dL (ref 1.7–2.4)

## 2022-04-08 LAB — PHOSPHORUS: Phosphorus: 2.2 mg/dL — ABNORMAL LOW (ref 2.5–4.6)

## 2022-04-08 LAB — C-REACTIVE PROTEIN: CRP: <0.5 mg/dL (ref <1.0)

## 2022-04-08 LAB — D-DIMER, QUANTITATIVE: D-Dimer, Quant: 1.07 ug/mL-FEU — ABNORMAL HIGH (ref 0.00–0.50)

## 2022-04-08 MED ORDER — HYDRALAZINE HCL 25 MG PO TABS: 25.0000 mg | ORAL_TABLET | Freq: Three times a day (TID) | ORAL | Status: DC | PRN

## 2022-04-08 MED ORDER — ATORVASTATIN CALCIUM 20 MG PO TABS: 80.0000 mg | ORAL_TABLET | Freq: Every day | ORAL | Status: DC

## 2022-04-08 MED ORDER — INSULIN DETEMIR 100 UNIT/ML ~~LOC~~ SOLN
8.0000 [IU] | Freq: Every day | SUBCUTANEOUS | Status: DC
  Administered 2022-04-08: 8 [IU] via SUBCUTANEOUS
  Filled 2022-04-08: qty 0.08

## 2022-04-08 MED ORDER — LOSARTAN POTASSIUM 25 MG PO TABS
25.0000 mg | ORAL_TABLET | Freq: Every day | ORAL | Status: DC
  Administered 2022-04-08: 25 mg via ORAL
  Filled 2022-04-08: qty 1

## 2022-04-08 MED ORDER — ASCORBIC ACID 500 MG PO TABS: 500.0000 mg | ORAL_TABLET | Freq: Every day | ORAL | 0 refills | Status: DC

## 2022-04-08 MED ORDER — ACETAMINOPHEN 325 MG PO TABS
650.0000 mg | ORAL_TABLET | Freq: Once | ORAL | Status: AC
  Administered 2022-04-08: 650 mg via ORAL

## 2022-04-08 MED ORDER — ADULT MULTIVITAMIN W/MINERALS CH: 1.0000 | ORAL_TABLET | Freq: Every day | ORAL | 1 refills | Status: DC

## 2022-04-08 MED ORDER — NIRMATRELVIR/RITONAVIR (PAXLOVID) TABLET (RENAL DOSING): 2.0000 | ORAL_TABLET | Freq: Two times a day (BID) | ORAL | 0 refills | Status: AC

## 2022-04-08 MED ORDER — LOSARTAN POTASSIUM 25 MG PO TABS: 25.0000 mg | ORAL_TABLET | Freq: Every day | ORAL | 1 refills | Status: DC

## 2022-04-08 MED ORDER — FERROUS SULFATE 325 (65 FE) MG PO TABS: 325.0000 mg | ORAL_TABLET | Freq: Every day | ORAL | 3 refills | Status: DC

## 2022-04-08 MED ORDER — ZINC SULFATE 220 (50 ZN) MG PO CAPS: 220.0000 mg | ORAL_CAPSULE | Freq: Every day | ORAL | 0 refills | Status: DC

## 2022-04-08 MED ORDER — IPRATROPIUM-ALBUTEROL 20-100 MCG/ACT IN AERS: 1.0000 | INHALATION_SPRAY | Freq: Four times a day (QID) | RESPIRATORY_TRACT | 1 refills | Status: DC

## 2022-04-08 MED ORDER — GUAIFENESIN-DM 100-10 MG/5ML PO SYRP: 10.0000 mL | ORAL_SOLUTION | ORAL | 0 refills | Status: DC | PRN

## 2022-04-08 MED ORDER — ATORVASTATIN CALCIUM 80 MG PO TABS: 80.0000 mg | ORAL_TABLET | Freq: Every day | ORAL | 2 refills | Status: AC

## 2022-04-08 MED ORDER — ACETAMINOPHEN 325 MG PO TABS
ORAL_TABLET | ORAL | Status: AC
  Filled 2022-04-08: qty 2

## 2022-04-08 MED ORDER — SODIUM PHOSPHATES 45 MMOLE/15ML IV SOLN
30.0000 mmol | Freq: Once | INTRAVENOUS | Status: AC
  Administered 2022-04-08: 30 mmol via INTRAVENOUS
  Filled 2022-04-08: qty 10

## 2022-04-08 NOTE — ED Notes (Signed)
Attending S. Amin notified pt c/o HA and requesting either tylenol or ibuprofen.

## 2022-04-08 NOTE — ED Notes (Signed)
Attending S. Amin requested via secure chat that this RN give 8 units fast-acting insulin as well as long-acting insulin. Notified pt states will not likely eat 50% or more of lunch and attending still requesting this RN give 8 units. Will give momentarily.

## 2022-04-08 NOTE — Inpatient Diabetes Management (Signed)
Inpatient Diabetes Program Recommendations  AACE/ADA: New Consensus Statement on Inpatient Glycemic Control (2015)  Target Ranges:  Prepandial:   less than 140 mg/dL      Peak postprandial:   less than 180 mg/dL (1-2 hours)      Critically ill patients:  140 - 180 mg/dL   Lab Results  Component Value Date   GLUCAP 123 (H) 04/08/2022   HGBA1C 9.1 (H) 03/09/2022    Review of Glycemic Control  Latest Reference Range & Units 04/08/22 03:37 04/08/22 03:38 04/08/22 04:08 04/08/22 08:00  Glucose-Capillary 70 - 99 mg/dL 49 (L) 49 (L) 71 123 (H)  (L): Data is abnormally low (H): Data is abnormally high Diabetes history: DM 1 Outpatient Diabetes medications:  Tresiba 15 units daily IF EATING (instructions for patient from Dr. Gabriel Carina) Take 3 unit of NovoLog if your sugar is 80 - 120 Take 5 unit of NovoLog if your sugar is 121-170 Take 7 units of NovoLog if your sugar is 171-220 Take 9 units of NovoLog if your sugar is 221-270 Take 10 units of NovoLog if your sugar is 271 or higher  IF NOT EATING (AND YOU HAVE NOT TAKEN NOVOLOG IN THE LAST 3 HOURS) Take 2 units of NovoLog if your sugar is 201-250 Take 4 units of NovoLog if your sugar is 251-300 Take 6 units of NovoLog if your sugar is 301 or higher   Current orders for Inpatient glycemic control:  Semglee 14 units QD, Novolog 3 units TID, Novolog 0-9 units TID & HS   Inpatient Diabetes Program Recommendations:    Noted severe hypoglycemia yesterday following transition.   Consider: -Changing correction to Novolog 0-6 units TID -Decreasing Semglee to 8 units QD  Thanks, Bronson Curb, MSN, RNC-OB Diabetes Coordinator [phone] (8a-5p)

## 2022-04-08 NOTE — ED Notes (Signed)
No new orders yet received in relation to pt's BP so messaged attending again via secure chat with updated BP. PT to work with pt soon.

## 2022-04-08 NOTE — TOC Initial Note (Signed)
Transition of Care [facility]) - Initial/Assessment Note    Patient Details  Name: Nicholas Weiss MRN: [id number] Date of Birth: October 18, 1950  [facility]) CM/SW Contact:    Beverly Sessions, RN Phone Number: 04/08/2022, 4:31 PM  Clinical Narrative:                  Patient admitted for DKA and covid Patient with extreme risk for readmission score.  Patient was assessed by [facility] previous admission  See note from that admission below "Readmission prevention screen complete. CSW met with patient. No supports at bedside. CSW introduced role and explained that discharge planning would be discussed. PCP is Dr. Netty Starring. Patient drives himself to appointments. Pharmacy is [facility] on [address]. No issues obtaining medications. Patient lives home with his significant other. He confirmed he is active with [facility] and agreeable to adding RN. Patient has recently been using a single-point cane. No further concerns. CSW encouraged patient to contact CSW as needed. CSW will continue to follow patient for support and facilitate return home once stable. Significant other will transport him home at discharge."  Patient states that his significant other will transport at discharge Therapy recommending home health.  Discussed with home health.  Patient ambulated 125 feet.   Patient states he does not feel home health will be indicated.  Patient states he plans to go back to work next week and would not meet home bound criteria for home health         Patient Goals and CMS Choice            Expected Discharge Plan and Services         Expected Discharge Date: 04/08/22                                    Prior Living Arrangements/Services                       Activities of Daily Living Home Assistive Devices/Equipment: Eyeglasses ADL Screening (condition at time of admission) Patient's cognitive ability adequate to safely complete daily  activities?: Yes Is the patient deaf or have difficulty hearing?: No Does the patient have difficulty seeing, even when wearing glasses/contacts?: No Does the patient have difficulty concentrating, remembering, or making decisions?: No Patient able to express need for assistance with ADLs?: Yes Does the patient have difficulty dressing or bathing?: No Independently performs ADLs?: Yes (appropriate for developmental age) Does the patient have difficulty walking or climbing stairs?: Yes Weakness of Legs: Both Weakness of Arms/Hands: None  Permission Sought/Granted                  Emotional Assessment              Admission diagnosis:  Dehydration [E86.0] DKA (diabetic ketoacidosis) ([place]) [E11.10] Generalized weakness [R53.1] DKA, type 1 ([place]) [E10.10] Diabetic ketoacidosis without coma associated with type 1 diabetes mellitus ([place]) [E10.10] Patient Active Problem List   Diagnosis Date Noted   Dehydration 04/08/2022   Diabetic ketoacidosis without coma associated with type 1 diabetes mellitus ([place]) 04/07/2022   Hyperkalemia 04/07/2022   DKA (diabetic ketoacidosis) ([place]) 04/07/2022   Traumatic pneumothorax 02/18/2022   Multiple fractures of ribs, left side, initial encounter for closed fracture 02/18/2022   Pneumothorax, closed, traumatic 02/18/2022   Benign prostatic hyperplasia with nocturia 02/12/2022   Stroke (cerebrum) ([place]) 11/07/2021  Multiple myeloma ([place]) 09/29/2021   Multiple myeloma not having achieved remission ([place]) 09/29/2021   Chronic hyponatremia 08/18/2021   COVID-19 virus infection 08/13/2021   History of CVA (cerebrovascular accident) 08/13/2021   Syncope and collapse 08/09/2021   Hepatic lesion 08/05/2021   Dyslipidemia 05/08/2021   History of substance abuse ([place]) 61/44/3154   Acute metabolic encephalopathy 00/86/7619   Cocaine abuse ([place]) 11/30/2020   Anemia of chronic disease 11/08/2020   Muscle twitching 09/12/2020   Smoldering multiple  myeloma 08/02/2020   Pre-syncope 08/01/2020   Heart block AV first degree 08/01/2020   Anemia of chronic kidney failure, stage 3 (moderate) ([place]) 05/28/2020   PUD (peptic ulcer disease)    Esophageal dysphagia    Encounter for screening colonoscopy    Generalized weakness 07/28/2019   Hypoglycemia 04/27/2019   Unresponsiveness 04/27/2019   Lung nodule 04/07/2019   Uncontrolled type 1 diabetes mellitus    Acute renal failure superimposed on stage 3a chronic kidney disease ([place])    CKD (chronic kidney disease) stage 3, GFR 30-59 ml/min ([place]) 04/05/2019   Hypercholesterolemia 01/24/2016   Essential (primary) hypertension 06/07/2006   Type 1 diabetes mellitus with diabetic neuropathy, unspecified ([place]) 05/31/2006   PCP:  Dion Body, MD Pharmacy:   [facility] [address] [place] [address] [place]) [place] [address] Phone: [phone] Fax: [phone]  [facility] [place], [place] [address] Phone: [phone] Fax: [phone]  [place], [place] [address] Phone: [phone] Fax: [phone]  [name] [address] [place] [address] Phone: [phone] Fax: [phone]     Social Determinants of Health (SDOH) Social History: SDOH Screenings   Food Insecurity: No Food Insecurity (04/08/2022)  Housing: Low Risk  (04/08/2022)  Transportation Needs: No Transportation Needs (04/08/2022)  Utilities: Not At Risk (04/08/2022)  Alcohol Screen: Low Risk  (10/02/2020)  Depression (PHQ2-9): Medium Risk (10/02/2020)  Financial Resource Strain: Low Risk  (01/01/2020)  Physical Activity: Insufficiently Active (01/01/2020)  Social Connections: Moderately Isolated (01/01/2020)  Stress: No Stress Concern Present (01/01/2020)  Tobacco Use:  Low Risk  (04/08/2022)   SDOH Interventions:     Readmission Risk Interventions    03/12/2022   10:22 AM 02/25/2022    2:43 PM 12/08/2021   10:33 AM  Readmission Risk Prevention Plan  Transportation Screening Complete Complete Complete  Medication Review Press photographer) Complete Complete Complete  PCP or Specialist appointment within 3-5 days of discharge Complete Complete Complete  HRI or Home Care Consult Complete Complete Complete  SW Recovery Care/Counseling Consult Complete  Complete  Palliative Care Screening Not Applicable Not Applicable Not [place] Not Applicable Not Applicable Not Applicable

## 2022-04-08 NOTE — Plan of Care (Signed)
Problem: Education: Goal: Ability to describe self-care measures that may prevent or decrease complications (Diabetes Survival Skills Education) will improve Outcome: Adequate for Discharge Goal: Individualized Educational Video(s) Outcome: Adequate for Discharge   Problem: Coping: Goal: Ability to adjust to condition or change in health will improve Outcome: Adequate for Discharge   Problem: Fluid Volume: Goal: Ability to maintain a balanced intake and output will improve Outcome: Adequate for Discharge   Problem: Health Behavior/Discharge Planning: Goal: Ability to identify and utilize available resources and services will improve Outcome: Adequate for Discharge Goal: Ability to manage health-related needs will improve Outcome: Adequate for Discharge   Problem: Metabolic: Goal: Ability to maintain appropriate glucose levels will improve Outcome: Adequate for Discharge   Problem: Nutritional: Goal: Maintenance of adequate nutrition will improve Outcome: Adequate for Discharge Goal: Progress toward achieving an optimal weight will improve Outcome: Adequate for Discharge   Problem: Skin Integrity: Goal: Risk for impaired skin integrity will decrease Outcome: Adequate for Discharge   Problem: Tissue Perfusion: Goal: Adequacy of tissue perfusion will improve Outcome: Adequate for Discharge   Problem: Education: Goal: Ability to describe self-care measures that may prevent or decrease complications (Diabetes Survival Skills Education) will improve Outcome: Adequate for Discharge Goal: Individualized Educational Video(s) Outcome: Adequate for Discharge   Problem: Cardiac: Goal: Ability to maintain an adequate cardiac output will improve Outcome: Adequate for Discharge   Problem: Health Behavior/Discharge Planning: Goal: Ability to identify and utilize available resources and services will improve Outcome: Adequate for Discharge Goal: Ability to manage health-related  needs will improve Outcome: Adequate for Discharge   Problem: Fluid Volume: Goal: Ability to achieve a balanced intake and output will improve Outcome: Adequate for Discharge   Problem: Metabolic: Goal: Ability to maintain appropriate glucose levels will improve Outcome: Adequate for Discharge   Problem: Nutritional: Goal: Maintenance of adequate nutrition will improve Outcome: Adequate for Discharge Goal: Maintenance of adequate weight for body size and type will improve Outcome: Adequate for Discharge   Problem: Respiratory: Goal: Will regain and/or maintain adequate ventilation Outcome: Adequate for Discharge   Problem: Urinary Elimination: Goal: Ability to achieve and maintain adequate renal perfusion and functioning will improve Outcome: Adequate for Discharge   Problem: Education: Goal: Knowledge of risk factors and measures for prevention of condition will improve Outcome: Adequate for Discharge   Problem: Coping: Goal: Psychosocial and spiritual needs will be supported Outcome: Adequate for Discharge   Problem: Respiratory: Goal: Will maintain a patent airway Outcome: Adequate for Discharge Goal: Complications related to the disease process, condition or treatment will be avoided or minimized Outcome: Adequate for Discharge   Problem: Education: Goal: Knowledge of General Education information will improve Description: Including pain rating scale, medication(s)/side effects and non-pharmacologic comfort measures Outcome: Adequate for Discharge   Problem: Health Behavior/Discharge Planning: Goal: Ability to manage health-related needs will improve Outcome: Adequate for Discharge   Problem: Clinical Measurements: Goal: Ability to maintain clinical measurements within normal limits will improve Outcome: Adequate for Discharge Goal: Will remain free from infection Outcome: Adequate for Discharge Goal: Diagnostic test results will improve Outcome: Adequate for  Discharge Goal: Respiratory complications will improve Outcome: Adequate for Discharge Goal: Cardiovascular complication will be avoided Outcome: Adequate for Discharge   Problem: Activity: Goal: Risk for activity intolerance will decrease Outcome: Adequate for Discharge   Problem: Nutrition: Goal: Adequate nutrition will be maintained Outcome: Adequate for Discharge   Problem: Coping: Goal: Level of anxiety will decrease Outcome: Adequate for Discharge   Problem: Elimination: Goal: Will  not experience complications related to bowel motility Outcome: Adequate for Discharge Goal: Will not experience complications related to urinary retention Outcome: Adequate for Discharge   Problem: Pain Managment: Goal: General experience of comfort will improve Outcome: Adequate for Discharge   Problem: Safety: Goal: Ability to remain free from injury will improve Outcome: Adequate for Discharge   Problem: Skin Integrity: Goal: Risk for impaired skin integrity will decrease Outcome: Adequate for Discharge

## 2022-04-08 NOTE — ED Notes (Addendum)
Pt accidentally pulled L ac IV out while standing to work with PT. Will place new IV and restart LR at 125 soon. Pt received his lunch tray and drink.

## 2022-04-08 NOTE — ED Notes (Signed)
Attending S. Amin notified via secure chat of pt's BP 191/95 and MAP 123. No PRN orders currently noted to address this. Awaiting reply/orders.

## 2022-04-08 NOTE — ED Notes (Signed)
Will give breathing tx once received from pharm; checked pyxis, nurses station, pt specific med drawer and tube station. Messaged pharm dose needed.

## 2022-04-08 NOTE — Progress Notes (Deleted)
NG tube in placed and it put out 500 ml. VSS. IVF were started. plan of care was reviewed with pt and family

## 2022-04-08 NOTE — ED Notes (Signed)
Pt given breakfast tray. Sitting up in bed eating

## 2022-04-08 NOTE — ED Notes (Signed)
Pt eating lunch now 

## 2022-04-08 NOTE — Evaluation (Signed)
Physical Therapy Evaluation Patient Details Name: Nicholas Weiss MRN: [id number] DOB: 1951/01/06 Today's Date: 04/08/2022  History of Present Illness  72 y.o. M who washospitalized in Nov 23 after fall with traumatic PTX, left rib fxs, L1 compression fx, ARF, hyperglycemia.  Now here with 1 week of generalized malaise, weakness and dizziness resulting in a few falls, found to be Covid +. PMH: DM1, multiple myeloma, CKD stage III.  Clinical Impression  Pt lethargic on arrival but able to wake easily and willing to work with PT.  He did relatively well functionally: able to easily get to EOB, don/tie shoes w/o assist, rise to standing w/o assist and ambulate >100 ft w/o AD.  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  Good effort, PT recommended possibly using a SPC at this time, though pt did not seem to consider this much, similarly recommending HHPT that pt did not seem to consider much either.  Unsure of true baseline and pt was able to move w/o direct assist but showed general unsteadiness and lack of awareness during PT exam.       Recommendations for follow up therapy are one component of a multi-disciplinary discharge planning process, led by the attending physician.  Recommendations may be updated based on patient status, additional functional criteria and insurance authorization.  Follow Up Recommendations Home health PT      Assistance Recommended at Discharge Set up Supervision/Assistance  Patient can return home with the following  A little help with walking and/or transfers;Assistance with cooking/housework    Equipment Recommendations None recommended by PT  Recommendations for Other Services       Functional  Status Assessment Patient has had a recent decline in their functional status and demonstrates the ability to make significant improvements in function in a reasonable and predictable amount of time.     Precautions / Restrictions Precautions Precautions: Fall Restrictions Weight Bearing Restrictions: No      Mobility  Bed Mobility Overal bed mobility: Modified Independent             General bed mobility comments: easily gets himself to sitting EOB w/o assist    Transfers Overall transfer level: Modified independent Equipment used: None               General transfer comment: Pt was able to rise w/o hesitation or AD, however he maintained forward flexed posture and did not show good self awareness despite cuing    Ambulation/Gait Ambulation/Gait assistance: Min guard Gait Distance (Feet): 125 Feet Assistive device: None         General Gait Details: Pt was able to ambulate >100 ft w/o AD but did have multiple small stagger steps, consistently forward hunched posture; PT asked about L side weakness/stagger stepping that he brushes off as "normal". Lacked confident full knee extension w/o overt buckling but general unsteadiness. Pt initially wanting to reach out to IVpole/PT hand but with cuing did slow and take more deliberate/safe steps though still with poor awareness in general.  HR did briefly get up to 150s during the limited ambulation bout.  Stairs            Wheelchair Mobility    Modified Rankin (Stroke Patients Only)  Balance Overall balance assessment: Needs assistance   Sitting balance-Leahy Scale: Normal       Standing balance-Leahy Scale: Fair Standing balance comment: Pt with no LOBs but general unsteadiness, poor weight acceptance on the L with general unsteadiness                             Pertinent Vitals/Pain      Home Living Family/patient expects to be discharged to:: Private residence Living  Arrangements: Spouse/significant other Available Help at Discharge: Family;Available 24 hours/day Type of Home: House Home Access: Stairs to enter Entrance Stairs-Rails: Can reach both Entrance Stairs-Number of Steps: 1-2 STE bil railing   Home Layout: One level Home Equipment: Cane - single point;Tub bench;Grab bars - toilet;Grab bars - tub/shower      Prior Function Prior Level of Function : Independent/Modified Independent             Mobility Comments: Independent; 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," with 6 beds in [place] ADLs Comments: reports he is typically able to do all he needs w/o issue     Hand Dominance   Dominant Hand: Right    Extremity/Trunk Assessment   Upper Extremity Assessment Upper Extremity Assessment: Overall WFL for tasks assessed;Generalized weakness (WNL, L grossly 4/5)    Lower Extremity Assessment Lower Extremity Assessment: Overall WFL for tasks assessed (WNL, L grossly 4-/5)       Communication   Communication: No difficulties  Cognition Arousal/Alertness: Awake/alert Behavior During Therapy: Restless Overall Cognitive Status: Within Functional Limits for tasks assessed                                 General Comments: Unable to really answer a lot of questions due to distraction,  knew it was Wed in Jan but said the 6th.  Flippantly reports as "normal" and "nothing" when discussing L LE apparent weakness and instability in standing        General Comments General comments (skin integrity, edema, etc.): Pt functional mobile and able to manage basic mobility and ~131f of ambulation w/o AD, however did not seem aware of his general unsteadiness, reports low grade L sided weakness as his normal    Exercises     Assessment/Plan    PT Assessment Patient needs continued PT services  PT Problem List Decreased strength;Decreased range of motion;Decreased activity tolerance;Decreased  balance;Decreased knowledge of use of DME;Decreased safety awareness;Cardiopulmonary status limiting activity       PT Treatment Interventions DME instruction;Gait training;Stair training;Functional mobility training;Therapeutic activities;Therapeutic exercise;Balance training;Neuromuscular re-education;Patient/family education;Cognitive remediation    PT Goals (Current goals can be found in the Care Plan section)       Frequency Min 2X/week     Co-evaluation               AM-PAC PT "6 Clicks" Mobility  Outcome Measure Help needed turning from your back to your side while in a flat bed without using bedrails?: None Help needed moving from lying on your back to sitting on the side of a flat bed without using bedrails?: None Help needed moving to and from a bed to a chair (including a wheelchair)?: None Help needed standing up from a chair using your arms (e.g., wheelchair or bedside chair)?: None Help needed to walk in hospital room?: A Little Help needed climbing 3-5 steps with a railing? :  A Little 6 Click Score: 22    End of Session Equipment Utilized During Treatment: Gait belt Activity Tolerance: Patient tolerated treatment well Patient left: with call bell/phone within reach;in bed Nurse Communication: Mobility status PT Visit Diagnosis: Muscle weakness (generalized) (M62.81);Difficulty in walking, not elsewhere classified (R26.2);Unsteadiness on feet (R26.81)    Time: 1340-1405 PT Time Calculation (min) (ACUTE ONLY): 25 min   Charges:   PT Evaluation $PT Eval Low Complexity: 1 Low PT Treatments $Gait Training: 8-22 mins        Kreg Shropshire, DPT 04/08/2022, 3:12 PM

## 2022-04-08 NOTE — Progress Notes (Signed)
DISCONTINUE ON PATHWAY REGIMEN - Multiple Myeloma and Other Plasma Cell Dyscrasias     A cycle is every 21 days:     Bortezomib      Lenalidomide      Dexamethasone   **Always confirm dose/schedule in your pharmacy ordering system**  REASON: Other Reason PRIOR TREATMENT: [id number]: VRd (Bortezomib 1.3 mg/m2 SUBQ D1, 8, 15 + Lenalidomide 25 mg + Dexamethasone 40 mg) q21 Days x 8 Cycles TREATMENT RESPONSE: Partial Response (PR)  START OFF PATHWAY REGIMEN - Multiple Myeloma and Other Plasma Cell Dyscrasias   [id number]:DaraVRd (Daratumumab SUBQ + Bortezomib SUBQ + Lenalidomide PO + Dexamethasone IV/PO) q21 Days (Induction Schema):   A cycle is every 21 days:     Lenalidomide      Dexamethasone      Bortezomib      Daratumumab and hyaluronidase-fihj   **Always confirm dose/schedule in your pharmacy ordering system**  Patient Characteristics: Multiple Myeloma, Newly Diagnosed, Transplant Ineligible or Refused, High Risk Disease Classification: Multiple Myeloma R-ISS Staging: III Therapeutic Status: Newly Diagnosed Is Patient Eligible for Transplant<= Transplant Ineligible or Refused Risk Status: High Risk Intent of Therapy: Non-Curative / Palliative Intent, Discussed with Patient

## 2022-04-08 NOTE — Progress Notes (Signed)
Discharge instructions were reviewed with pt. Pt denies pain at this time. IV was taken out. Belongings were pick up by pt.

## 2022-04-08 NOTE — Discharge Summary (Signed)
Physician Discharge Summary   Patient: Nicholas Weiss MRN: [id number] DOB: [birth year]  Admit date:     04/07/2022  Discharge date: 04/08/22  Discharge Physician: Lorella Nimrod   PCP: Dion Body, MD   Recommendations at discharge:  Please obtain CBC and CMP in 1 week Follow-up with primary care provider within a week Follow-up with endocrinologist Follow-up with oncologist  Discharge Diagnoses: Principal Problem:   Diabetic ketoacidosis without coma associated with type 1 diabetes mellitus ([place]) Active Problems:   Acute metabolic encephalopathy   COVID-19 virus infection   Hyperkalemia   Essential (primary) hypertension   Acute renal failure superimposed on stage 3a chronic kidney disease ([place])   Multiple myeloma ([place])   Dehydration   Hospital Course: Taken from H&P.   Nicholas Weiss is a 72 y.o. male with medical history significant for cocaine abuse, type 1 diabetes, multiple myeloma on Revlimid, CKD stage IIIa baseline creatinine 1.5-1.7 , hospitalized from 12/13 to 12/14 with symptomatic hypoglycemia, who presents to the ED with a 1 week history of generalized malaise, weakness and dizziness resulting in a few falls.  He denies cough or congestion, fever or chills and denies nausea, vomiting, abdominal pain or diarrhea has had no chest pain or palpitations.   ED course and data review: BP 148/110 with otherwise normal vitals labs with blood glucose 741 with anion gap of 18 venous pH of 7.28 and beta hydroxybutyric acid of 4.26.  Other labs significant for creatinine of 2.11 above baseline of 1.5, potassium 6.1.  Troponin 23, hemoglobin 10.5 which is his baseline.  Beta-hydroxybutyrate acid elevated at 4.26.  UA with glucose urea and mild ketonuria.  Respiratory panel positive for COVID-19 PCR EK G, personally viewed and interpreted showing sinus at 96 with no acute changes.   Chest x-ray clear. Patient given fluid boluses started on insulin infusion per Endo  tool.  1/9: Hemodynamically stable, on room air.  CBG is still elevated.  Creatinine started improving, currently at 1.93.  Hyperkalemia resolved with potassium of 3.6 and Closed X 1.  CBC with hemoglobin decreased to 8.8, which is more close to his baseline, prior reading most likely hemoconcentration.  Recent anemia panel consistent with anemia of chronic disease with some iron deficiency.  Also starting him on iron supplement Starting him on Paxlovid with supportive care for COVID infection.  CBG improved and gap closed x 2, he is being transition to basal and short-acting.  1/10: Hemodynamically stable with CBG of 123 this morning, patient did develop hypoglycemia overnight after getting basal with CBG recorded as low as 19.  Improved with intervention and diet.  Decreasing Levemir to 8 units daily, patient was on 16 units at home.  Renal function improved.  Phosphorus low at 2.2 which is being repleted.  D-dimer improving.  CRP normal. CBG improved and patient able to tolerate diet well.  PT evaluated him and recommended home health services which were ordered.  Patient is being discharged on his home medications along with Paxlovid with instructions to hold Lipitor while taking Paxlovid.  He need to have a close follow-up with his endocrinologist and most likely will get benefit from getting continuous glucose monitor.  Patient also need a close follow-up with his oncologist for ongoing treatment for multiple myeloma.  Per oncology note patient is getting palliative management.  We also added low-dose losartan due to elevated blood pressure and his PCP can titrate as needed.  Patient will be high risk for deterioration and mortality based on  life limiting underlying comorbidities.  He will continue with rest of his home medications and need to have a close follow-up with his providers for further recommendations.  Assessment and Plan: * Diabetic ketoacidosis without coma associated with  type 1 diabetes mellitus ([place]) Gap closed twice and beta-hydroxybutyrate acid improving. Patient is being transitioned to basal and short-acting. -Levemir 14 units daily -Sensitive SSI -3 units with meal if he eats more than 58%  Acute metabolic encephalopathy Improving.  Most likely was secondary to DKA  COVID-19 virus infection COVID-19 infection can be contributory.  Not much upper respiratory symptoms and chest x-ray was clear. D-dimer elevated but patient has multiple myeloma and COVID-19, on room air, might have to rule out PE if worsening respiratory status. -Starting him on Paxlovid based on underlying comorbidities -Supportive care  Hyperkalemia Resolved. Potassium 6.1 on admission secondary to DKA with AKI   Essential (primary) hypertension Not currently on antihypertensives  Acute renal failure superimposed on stage 3a chronic kidney disease ([place]) Improving. Creatinine 2.11>>1.58 up from baseline of 1.3-1.4 -Continue IV hydration Continue to monitor.  Avoid nephrotoxins  Multiple myeloma ([facility]) Currently on Revlimid -Being managed by oncology as outpatient         Consultants: None Procedures performed: None Disposition: Home health Diet recommendation:  Discharge Diet Orders (From admission, onward)     Start     Ordered   04/08/22 0000  Diet - low sodium heart healthy        04/08/22 1502           Cardiac and Carb modified diet DISCHARGE MEDICATION: Allergies as of 04/08/2022       Reactions   Penicillins Other (See Comments)   Childhood allergy Unknown reaction Has patient had a PCN reaction causing immediate rash, facial/tongue/throat swelling, SOB or lightheadedness with hypotension: No Has patient had a PCN reaction causing severe rash involving mucus membranes or skin necrosis: No Has patient had a PCN reaction that required hospitalization No Has patient had a PCN reaction occurring within the last 10 years: No If all of the above  answers are "NO", then may proceed with Cephalosporin use.        Medication List     STOP taking these medications    acyclovir 400 MG tablet Commonly known as: ZOVIRAX   dicyclomine 20 MG tablet Commonly known as: BENTYL   insulin lispro 100 UNIT/ML KwikPen Commonly known as: HUMALOG       TAKE these medications    acetaminophen 500 MG tablet Commonly known as: TYLENOL Take 1,000 mg by mouth daily as needed for mild pain or moderate pain.   ascorbic acid 500 MG tablet Commonly known as: VITAMIN C Take 1 tablet (500 mg total) by mouth daily. Start taking on: April 09, 2022   aspirin EC 81 MG tablet Take 81 mg by mouth in the morning.   atorvastatin 80 MG tablet Commonly known as: LIPITOR Take 1 tablet (80 mg total) by mouth at bedtime. Hold while taking Paxlovid What changed: additional instructions   Baclofen 5 MG Tabs Take 5 mg by mouth daily as needed (muscle spasm).   clopidogrel 75 MG tablet Commonly known as: PLAVIX Take 75 mg by mouth in the morning.   docusate sodium 100 MG capsule Commonly known as: COLACE Take 100 mg by mouth daily as needed for mild constipation.   Farxiga 10 MG Tabs tablet Generic drug: dapagliflozin propanediol Take 10 mg by mouth in the morning.   ferrous sulfate  325 (65 FE) MG tablet Take 1 tablet (325 mg total) by mouth daily with breakfast. Start taking on: April 09, 2022   gabapentin 300 MG capsule Commonly known as: NEURONTIN Take 300-600 mg by mouth See admin instructions. 300 mg in the morning, 600 mg at night.   guaiFENesin-dextromethorphan 100-10 MG/5ML syrup Commonly known as: ROBITUSSIN DM Take 10 mLs by mouth every 4 (four) hours as needed for cough.   Ipratropium-Albuterol 20-100 MCG/ACT Aers respimat Commonly known as: COMBIVENT Inhale 1 puff into the lungs every 6 (six) hours.   lenalidomide 10 MG capsule Commonly known as: REVLIMID Take 1 capsule (10 mg total) by mouth daily. Take 10 mg daily  for 14 days and then off 7 days ( start the meds on 04/03/2022) obtained1/03/2022, [facility]: [id number]   losartan 25 MG tablet Commonly known as: COZAAR Take 1 tablet (25 mg total) by mouth daily. Start taking on: April 09, 2022   multivitamin with minerals Tabs tablet Take 1 tablet by mouth daily. Start taking on: April 09, 2022   nirmatrelvir/ritonavir (renal dosing) 10 x 150 MG & 10 x '100MG'$  Tabs Commonly known as: PAXLOVID Take 2 tablets by mouth 2 (two) times daily for 5 days. Patient GFR is >60. Take nirmatrelvir (150 mg) one tablet twice daily for 5 days and ritonavir (100 mg) one tablet twice daily for 5 days.   NovoLOG 100 UNIT/ML injection Generic drug: insulin aspart Inject 0-9 Units into the skin 3 (three) times daily with meals.   ondansetron 8 MG tablet Commonly known as: ZOFRAN Take 8 mg by mouth daily as needed for nausea or vomiting.   prochlorperazine 10 MG tablet Commonly known as: COMPAZINE Take 10 mg by mouth daily as needed for nausea or vomiting.   [name] FlexTouch 100 UNIT/ML FlexTouch Pen Generic drug: insulin degludec Inject 15 Units into the skin daily.   zinc sulfate 220 (50 Zn) MG capsule Take 1 capsule (220 mg total) by mouth daily. Start taking on: April 09, 2022        Follow-up Information     Dion Body, MD. Schedule an appointment as soon as possible for a visit in 1 week(s).   Specialty: Family Medicine Contact information: [place] [address] [phone]         Judi Cong, MD. Schedule an appointment as soon as possible for a visit in 1 week(s).   Specialty: Endocrinology Contact information: [place] [address] [phone]         Sindy Guadeloupe, MD. Schedule an appointment as soon as possible for a visit.   Specialty: Oncology Contact information: [place] [address] [phone]                 Discharge Exam: Danley Danker Weights   04/06/22 2057  Weight: 70.1 kg   General.     In no acute distress. Pulmonary.  Lungs clear bilaterally, normal respiratory effort. CV.  Regular rate and rhythm, no JVD, rub or murmur. Abdomen.  Soft, nontender, nondistended, BS positive. CNS.  Alert and oriented .  No focal neurologic deficit. Extremities.  No edema, no cyanosis, pulses intact and symmetrical. Psychiatry.  Judgment and insight appears normal.   Condition at discharge: stable  The results of significant diagnostics from this hospitalization (including imaging, microbiology, ancillary and laboratory) are listed below for reference.   Imaging Studies: DG Chest Port 1 View  Result Date:  04/06/2022 CLINICAL DATA:  86247 Dizziness 86247 EXAM: PORTABLE CHEST - 1 VIEW COMPARISON:  02/24/2022. FINDINGS: Cardiac silhouette is unremarkable. No pneumothorax or pleural effusion. The lungs are clear. Aorta is calcified. The visualized skeletal structures are unremarkable. IMPRESSION: No acute cardiopulmonary process. Electronically Signed   By: Sammie Bench M.D.   On: 04/06/2022 21:16   US ARTERIAL ABI (SCREENING LOWER EXTREMITY)  Result Date: 03/11/2022 CLINICAL DATA:  Toe ulcers. EXAM: NONINVASIVE PHYSIOLOGIC VASCULAR STUDY OF BILATERAL LOWER EXTREMITIES TECHNIQUE: Evaluation of both lower extremities were performed at rest, including calculation of ankle-brachial indices with single level Doppler, pressure and pulse volume recording. COMPARISON:  None Available. FINDINGS: Right ABI:  1.16 Left ABI:  1.24 Right Lower Extremity:  Normal arterial waveforms at the ankle. Left Lower Extremity:  Normal arterial waveforms at the ankle. 1.0-1.4 Normal IMPRESSION: Normal ankle-brachial indices bilaterally. Electronically Signed   By: Markus Daft M.D.   On: 03/11/2022 16:47   MR BRAIN WO CONTRAST  Result Date: 03/10/2022 CLINICAL DATA:  Encephalopathy EXAM: MRI HEAD WITHOUT CONTRAST  TECHNIQUE: Multiplanar, multiecho pulse sequences of the brain and surrounding structures were obtained without intravenous contrast. COMPARISON:  None Available. FINDINGS: Brain: No acute infarct, mass effect or extra-axial collection. Approximately 5 bilateral periventricular chronic microhemorrhages. No acute hemorrhage. There is multifocal hyperintense T2-weighted signal within the white matter. Parenchymal volume and CSF spaces are normal. The midline structures are normal. Vascular: Major flow voids are preserved. Skull and upper cervical spine: Normal calvarium and skull base. Visualized upper cervical spine and soft tissues are normal. Sinuses/Orbits:No paranasal sinus fluid levels or advanced mucosal thickening. No mastoid or middle ear effusion. Normal orbits. IMPRESSION: 1. No acute intracranial abnormality. 2. Findings of chronic small vessel ischemia. Electronically Signed   By: Ulyses Jarred M.D.   On: 03/10/2022 01:38    Microbiology: Results for orders placed or performed during the hospital encounter of 04/07/22  Resp panel by RT-PCR (RSV, Flu A&B, Covid) Anterior Nasal Swab     Status: Abnormal   Collection Time: 04/07/22  3:47 AM   Specimen: Anterior Nasal Swab  Result Value Ref Range Status   SARS Coronavirus 2 by RT PCR POSITIVE (A) NEGATIVE Final    Comment: (NOTE) SARS-CoV-2 target nucleic acids are DETECTED.  The SARS-CoV-2 RNA is generally detectable in upper respiratory specimens during the acute phase of infection. Positive results are indicative of the presence of the identified virus, but do not rule out bacterial infection or co-infection with other pathogens not detected by the test. Clinical correlation with patient history and other diagnostic information is necessary to determine patient infection status. The expected result is Negative.  Fact Sheet for Patients: EntrepreneurPulse.com.au  Fact Sheet for Healthcare  Providers: IncredibleEmployment.be  This test is not yet approved or cleared by the [place] FDA and  has been authorized for detection and/or diagnosis of SARS-CoV-2 by FDA under an Emergency Use Authorization (EUA).  This EUA will remain in effect (meaning this test can be used) for the duration of  the COVID-19 declaration under Section 564(b)(1) of the A ct, 21 U.S.C. section 360bbb-3(b)(1), unless the authorization is terminated or revoked sooner.     Influenza A by PCR NEGATIVE NEGATIVE Final   Influenza B by PCR NEGATIVE NEGATIVE Final    Comment: (NOTE) The Xpert Xpress SARS-CoV-2/FLU/RSV plus assay is intended as an aid in the diagnosis of influenza from Nasopharyngeal swab specimens and should not be used as a sole basis for treatment. Nasal washings  and aspirates are unacceptable for Xpert Xpress SARS-CoV-2/FLU/RSV testing.  Fact Sheet for Patients: EntrepreneurPulse.com.au  Fact Sheet for Healthcare Providers: IncredibleEmployment.be  This test is not yet approved or cleared by the [place] FDA and has been authorized for detection and/or diagnosis of SARS-CoV-2 by FDA under an Emergency Use Authorization (EUA). This EUA will remain in effect (meaning this test can be used) for the duration of the COVID-19 declaration under Section 564(b)(1) of the Act, 21 U.S.C. section 360bbb-3(b)(1), unless the authorization is terminated or revoked.     Resp Syncytial Virus by PCR NEGATIVE NEGATIVE Final    Comment: (NOTE) Fact Sheet for Patients: EntrepreneurPulse.com.au  Fact Sheet for Healthcare Providers: IncredibleEmployment.be  This test is not yet approved or cleared by the [place] FDA and has been authorized for detection and/or diagnosis of SARS-CoV-2 by FDA under an Emergency Use Authorization (EUA). This EUA will remain in effect (meaning this test can be  used) for the duration of the COVID-19 declaration under Section 564(b)(1) of the Act, 21 U.S.C. section 360bbb-3(b)(1), unless the authorization is terminated or revoked.  Performed at [facility], [address] [place], [place] [address]     Labs: CBC: Recent Labs  Lab 04/03/22 1410 04/06/22 2053 04/07/22 0617  WBC 3.2* 4.3 4.0  NEUTROABS 1.4* 3.1  --   HGB 8.1* 10.5* 8.8*  HCT 25.4* 34.7* 28.8*  MCV 92.7 96.4 96.6  PLT 433* 425* 528   Basic Metabolic Panel: Recent Labs  Lab 04/06/22 2053 04/07/22 0347 04/07/22 0617 04/07/22 1155 04/08/22 0438  NA 135 138 141 143 141  K 6.1* 5.3* 3.6 3.6 4.5  CL 96* 100 106 110 107  CO2 21* 19* '23 23 29  '$ GLUCOSE 741* 761* 543* 230* 112*  BUN 60* 65* 61* 53* 33*  CREATININE 2.11* 2.04* 1.93* 1.58* 1.17  CALCIUM 9.1 8.7* 8.5* 8.8* 8.5*  MG  --   --   --   --  1.9  PHOS  --   --   --   --  2.2*   Liver Function Tests: Recent Labs  Lab 04/03/22 1410 04/06/22 2053 04/08/22 0438  AST '19 27 16  '$ ALT 23 51* 28  ALKPHOS 91 117 82  BILITOT 0.4 1.1 0.7  PROT 8.6* 9.6* 7.0  ALBUMIN 3.1* 3.4* 2.7*   CBG: Recent Labs  Lab 04/08/22 0408 04/08/22 0800 04/08/22 1152 04/08/22 1338 04/08/22 1439  GLUCAP 71 123* 300* 269* 217*    Discharge time spent: greater than 30 minutes.  This record has been created using Systems analyst. Errors have been sought and corrected,but may not always be located. Such creation errors do not reflect on the standard of care.   Signed: Lorella Nimrod, MD [facility] 04/08/2022

## 2022-04-08 NOTE — ED Notes (Signed)
Beth RN from [facility] states is reviewing pt's chart now and will come to bedside as admission RN to receive bedside report soon.

## 2022-04-09 ENCOUNTER — Ambulatory Visit
Admission: RE | Admit: 2022-04-09 | Discharge: 2022-04-09 | Disposition: A | Discharge status: Home / Self Care | Payer: 59 | Source: Ambulatory Visit | Attending: Oncology | Admitting: Oncology

## 2022-04-09 DIAGNOSIS — C9000 Multiple myeloma not having achieved remission: Secondary | ICD-10-CM | POA: Diagnosis present

## 2022-04-09 DIAGNOSIS — L02612 Cutaneous abscess of left foot: Secondary | ICD-10-CM

## 2022-04-09 DIAGNOSIS — E1040 Type 1 diabetes mellitus with diabetic neuropathy, unspecified: Secondary | ICD-10-CM | POA: Insufficient documentation

## 2022-04-10 ENCOUNTER — Inpatient Hospital Stay: Payer: 59

## 2022-04-10 ENCOUNTER — Ambulatory Visit: Payer: Medicare Other | Admitting: Oncology

## 2022-04-14 ENCOUNTER — Other Ambulatory Visit: Payer: Medicare Other

## 2022-04-16 ENCOUNTER — Telehealth: Payer: Self-pay | Admitting: Pharmacist

## 2022-04-16 NOTE — Telephone Encounter (Signed)
Called patient to ask him to set-up Revlimid delivery with [facility] today so that his medication can be delivered in time to start tomorrow with his in clinic treatment.   Update patient calendar will be given to Judeen Hammans, RN to give to Nicholas Weiss.

## 2022-04-17 ENCOUNTER — Inpatient Hospital Stay: Payer: 59

## 2022-04-17 ENCOUNTER — Ambulatory Visit: Payer: Medicare Other

## 2022-04-17 ENCOUNTER — Other Ambulatory Visit: Payer: Medicare Other

## 2022-04-17 ENCOUNTER — Inpatient Hospital Stay (HOSPITAL_BASED_OUTPATIENT_CLINIC_OR_DEPARTMENT_OTHER): Payer: 59 | Admitting: Oncology

## 2022-04-17 ENCOUNTER — Ambulatory Visit: Payer: Medicare Other | Admitting: Oncology

## 2022-04-17 ENCOUNTER — Encounter: Payer: Self-pay | Admitting: Oncology

## 2022-04-17 ENCOUNTER — Telehealth: Payer: Self-pay | Admitting: *Deleted

## 2022-04-17 VITALS — BP 106/79 | HR 81 | Temp 93.0°F | Resp 18 | Wt 130.0 lb

## 2022-04-17 DIAGNOSIS — C9000 Multiple myeloma not having achieved remission: Secondary | ICD-10-CM | POA: Diagnosis not present

## 2022-04-17 DIAGNOSIS — N179 Acute kidney failure, unspecified: Secondary | ICD-10-CM

## 2022-04-17 DIAGNOSIS — Z5112 Encounter for antineoplastic immunotherapy: Secondary | ICD-10-CM | POA: Diagnosis not present

## 2022-04-17 LAB — COMPREHENSIVE METABOLIC PANEL
ALT: 16 U/L (ref 0–44)
AST: 14 U/L — ABNORMAL LOW (ref 15–41)
Albumin: 3.7 g/dL (ref 3.5–5.0)
Alkaline Phosphatase: 98 U/L (ref 38–126)
Anion gap: 10 (ref 5–15)
BUN: 63 mg/dL — ABNORMAL HIGH (ref 8–23)
CO2: 27 mmol/L (ref 22–32)
Calcium: 9.4 mg/dL (ref 8.9–10.3)
Chloride: 105 mmol/L (ref 98–111)
Creatinine, Ser: 2.92 mg/dL — ABNORMAL HIGH (ref 0.61–1.24)
GFR, Estimated: 23 mL/min — ABNORMAL LOW (ref >60)
Glucose, Bld: 131 mg/dL — ABNORMAL HIGH (ref 70–99)
Potassium: 3.8 mmol/L (ref 3.5–5.1)
Sodium: 142 mmol/L (ref 135–145)
Total Bilirubin: 0.6 mg/dL (ref 0.3–1.2)
Total Protein: 9.1 g/dL — ABNORMAL HIGH (ref 6.5–8.1)

## 2022-04-17 LAB — CBC WITH DIFFERENTIAL/PLATELET
Abs Immature Granulocytes: 0.01 10*3/uL (ref 0.00–0.07)
Basophils Absolute: 0.1 10*3/uL (ref 0.0–0.1)
Basophils Relative: 2 %
Eosinophils Absolute: 0.1 10*3/uL (ref 0.0–0.5)
Eosinophils Relative: 4 %
HCT: 35.1 % — ABNORMAL LOW (ref 39.0–52.0)
Hemoglobin: 11 g/dL — ABNORMAL LOW (ref 13.0–17.0)
Immature Granulocytes: 0 %
Lymphocytes Relative: 34 %
Lymphs Abs: 1.1 10*3/uL (ref 0.7–4.0)
MCH: 29.4 pg (ref 26.0–34.0)
MCHC: 31.3 g/dL (ref 30.0–36.0)
MCV: 93.9 fL (ref 80.0–100.0)
Monocytes Absolute: 0.5 10*3/uL (ref 0.1–1.0)
Monocytes Relative: 16 %
Neutro Abs: 1.4 10*3/uL — ABNORMAL LOW (ref 1.7–7.7)
Neutrophils Relative %: 44 %
Platelets: 409 10*3/uL — ABNORMAL HIGH (ref 150–400)
RBC: 3.74 MIL/uL — ABNORMAL LOW (ref 4.22–5.81)
RDW: 16.3 % — ABNORMAL HIGH (ref 11.5–15.5)
WBC: 3.2 10*3/uL — ABNORMAL LOW (ref 4.0–10.5)
nRBC: 0 % (ref 0.0–0.2)

## 2022-04-17 MED ORDER — SODIUM CHLORIDE 0.9 % IV SOLN
Freq: Once | INTRAVENOUS | Status: AC
  Filled 2022-04-17: qty 250

## 2022-04-17 NOTE — Progress Notes (Signed)
Patient here for oncology follow-up appointment, concerns of no appetite & lightheaded

## 2022-04-17 NOTE — Telephone Encounter (Signed)
Pt was not able to get his treatment today and I went to infusion and told and wrote on the  appt. Paper that pt do not take the revlimid since he did not get treatment today and did not feel good. I told him and wrote on paper to bring the revlimid with him on his appt next week. He did agree with me about this

## 2022-04-17 NOTE — Progress Notes (Signed)
Hematology/Oncology Consult note [facility]  Telephone:([phone] [phone]  Patient Care Team: Dion Body, MD as PCP - General (Family Medicine) Pa, [facility] (Optometry) Sindy Guadeloupe, MD as Consulting Physician (Oncology)   Name of the patient: Nicholas Weiss  [id number]  Sep 28, 1950   Date of visit: 04/17/22  Diagnosis- high risk IgG lambda multiple myeloma R-ISS stage III    Chief complaint/ Reason for visit-on treatment assessment prior to cycle 1 day 1 of D VRD chemotherapy.  He has completed 7 cycles of Velcade and Revlimid treatment so far  Heme/Onc history:  Patient is a 72 year old African-American male with a past medical history significant for uncontrolled type 1 diabetes, chronic kidney disease stage III chronic normocytic anemia referred for abnormal SPEP. Patient's creatinine has been fluctuating between 1.5-2 but about 5 months ago it went up all the way to 5 and his blood sugars were in the 1000 range. More recently his kidney numbers have been drifting back to normal values. As a part of the work-up he had serum protein electrophoresis done which showed an elevated gammaglobulin fraction with an M spike of 3%. The amount of M spike was not quantified in the specimen. He has therefore been referred to [place] for further management. Patient endorses chronic fatigue reports that his appetite and weight have remained stable   Results of blood work from 04/08/2020 were as follows: CBC showed white count of 2.9, H&H of 10.5/31.2 with an MCV of 91 and a platelet count of 191.  CMP showed a mildly elevated creatinine of 1.2 which was better as compared to 5 months ago when it was 3.9.  Total protein was mildly elevated at 9.1 calcium normal at 8.4 ferritin and iron studies B12 folate TSH and haptoglobin were normal.  Myeloma panel revealed an elevated IgG level of 06/04/2004 with an M protein of 3.1 g.  Immunofixation showed IgG lambda  specificity.  Serum free light chain ratio was elevated at 25 and free light chain lambda elevated at 370   Further myeloma work-up including a PET CT scan did not reveal any evidence of edematous lesions.  Bone marrow biopsy showed 23% plasma cells by manual count and 30% by CD138 IHC.  Normal cytogenetics.  FISH studies for myeloma showed gain of 1 q.  13 q-. detected.  P53 not detected.   He was treated for a year and has having smoldering multiple myeloma since both CKD and anemia have been stable for 3 to 4 years and could be secondary to uncontrolled diabetes.In June 2023 IgG levels increased to 5463 with an M protein of 4.1 g as compared to 2.6 g in September 2022.  Serum free light chain ratio remains around 25.  Therefore a repeat bone marrow biopsy was done which shows further increase in plasma cells ranging from 30 to 70% and by immunohistochemistry 60 to 70% of the cells were positive for CD138.  Cytogenetics normal.  FISH study showed 4: 14 translocation and gain of 1 q. making this high risk RISS stage III.  Repeat PET scan showed no lytic lesions    Interval history-patient does not feel well overall today.  Appetite is poor and he barelyEats or drinks anything.  He has lost 24 pounds since his visit 2 weeks ago.  He was diagnosed with COVID as well about 2 weeks ago.  ECOG PS- 2 Pain scale- 0   Review of systems- Review of Systems  Constitutional:  Positive for malaise/fatigue and weight loss. Negative for chills and fever.  HENT:  Negative for congestion, ear discharge and nosebleeds.   Eyes:  Negative for blurred vision.  Respiratory:  Negative for cough, hemoptysis, sputum production, shortness of breath and wheezing.   Cardiovascular:  Negative for chest pain, palpitations, orthopnea and claudication.  Gastrointestinal:  Negative for abdominal pain, blood in stool, constipation, diarrhea, heartburn, melena, nausea and vomiting.  Genitourinary:  Negative for dysuria, flank  pain, frequency, hematuria and urgency.  Musculoskeletal:  Negative for back pain, joint pain and myalgias.  Skin:  Negative for rash.  Neurological:  Negative for dizziness, tingling, focal weakness, seizures, weakness and headaches.  Endo/Heme/Allergies:  Does not bruise/bleed easily.  Psychiatric/Behavioral:  Negative for depression and suicidal ideas. The patient does not have insomnia.       Allergies  Allergen Reactions   Penicillins Other (See Comments)    Childhood allergy Unknown reaction Has patient had a PCN reaction causing immediate rash, facial/tongue/throat swelling, SOB or lightheadedness with hypotension: No Has patient had a PCN reaction causing severe rash involving mucus membranes or skin necrosis: No Has patient had a PCN reaction that required hospitalization No Has patient had a PCN reaction occurring within the last 10 years: No If all of the above answers are "NO", then may proceed with Cephalosporin use.      Past Medical History:  Diagnosis Date   DKA (diabetic ketoacidoses) 04/06/2016   Hypercholesteremia    Hypertension      Past Surgical History:  Procedure Laterality Date   COLONOSCOPY WITH PROPOFOL N/A 02/01/2020   Procedure: COLONOSCOPY WITH PROPOFOL;  Surgeon: Lin Landsman, MD;  Location: [facility] ENDOSCOPY;  Service: Gastroenterology;  Laterality: N/A;   ESOPHAGOGASTRODUODENOSCOPY  02/01/2020   Procedure: ESOPHAGOGASTRODUODENOSCOPY (EGD);  Surgeon: Lin Landsman, MD;  Location: [facility] ENDOSCOPY;  Service: Gastroenterology;;   KNEE SURGERY Right    Torn meniscus   KNEE SURGERY Left     Social History   Socioeconomic History   Marital status: Single    Spouse name: Not on file   Number of children: 3   Years of education: Not on file   Highest education level: High school graduate  Occupational History    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  Tobacco Use   Smoking status: Never   Smokeless tobacco: Never  Vaping Use   Vaping Use:  Never used  Substance and Sexual Activity   Alcohol use: Not Currently    Alcohol/week: 0.0 - 1.0 standard drinks of alcohol    Comment: "once every 2 months"   Drug use: Not Currently    Types: Marijuana, "Crack" cocaine    Comment: last week   Sexual activity: Not Currently    Birth control/protection: None  Other Topics Concern   Not on file  Social History Narrative   Lives with girlfriend "sharon"   Social Determinants of Health   Financial Resource Strain: Low Risk  (01/01/2020)   Overall Financial Resource Strain (CARDIA)    Difficulty of Paying Living Expenses: Not hard at all  Food Insecurity: No Food Insecurity (04/08/2022)   Hunger Vital Sign    Worried About Running Out of Food in the Last Year: Never true    Ran Out of Food in the Last Year: Never true  Transportation Needs: No Transportation Needs (04/08/2022)   PRAPARE - Hydrologist (Medical): No    Lack of Transportation (Non-Medical): No  Physical Activity: Insufficiently Active (01/01/2020)  Exercise Vital Sign    Days of Exercise per Week: 7 days    Minutes of Exercise per Session: 20 min  Stress: No Stress Concern Present (01/01/2020)   [place]    Feeling of Stress : Not at all  Social Connections: Moderately Isolated (01/01/2020)   Social Connection and Isolation Panel [NHANES]    Frequency of Communication with Friends and Family: More than three times a week    Frequency of Social Gatherings with Friends and Family: More than three times a week    Attends Religious Services: Never    Marine scientist or Organizations: No    Attends Archivist Meetings: Never    Marital Status: Living with partner  Intimate Partner Violence: Not At Risk (04/08/2022)   Humiliation, Afraid, Rape, and Kick questionnaire    Fear of Current or Ex-Partner: No    Emotionally Abused: No    Physically Abused: No     Sexually Abused: No    Family History  Problem Relation Age of Onset   Heart attack Father    Hypertension Sister    Cancer Sister      Current Outpatient Medications:    acetaminophen (TYLENOL) 500 MG tablet, Take 1,000 mg by mouth daily as needed for mild pain or moderate pain., Disp: , Rfl:    ascorbic acid (VITAMIN C) 500 MG tablet, Take 1 tablet (500 mg total) by mouth daily., Disp: 90 tablet, Rfl: 0   aspirin EC 81 MG tablet, Take 81 mg by mouth in the morning., Disp: , Rfl:    atorvastatin (LIPITOR) 80 MG tablet, Take 1 tablet (80 mg total) by mouth at bedtime. Hold while taking Paxlovid, Disp: 30 tablet, Rfl: 2   Baclofen 5 MG TABS, Take 5 mg by mouth daily as needed (muscle spasm)., Disp: , Rfl:    clopidogrel (PLAVIX) 75 MG tablet, Take 75 mg by mouth in the morning., Disp: , Rfl:    docusate sodium (COLACE) 100 MG capsule, Take 100 mg by mouth daily as needed for mild constipation., Disp: , Rfl:    FARXIGA 10 MG TABS tablet, Take 10 mg by mouth in the morning., Disp: , Rfl:    ferrous sulfate 325 (65 FE) MG tablet, Take 1 tablet (325 mg total) by mouth daily with breakfast., Disp: 90 tablet, Rfl: 3   gabapentin (NEURONTIN) 300 MG capsule, Take 300-600 mg by mouth See admin instructions. 300 mg in the morning, 600 mg at night., Disp: , Rfl:    guaiFENesin-dextromethorphan (ROBITUSSIN DM) 100-10 MG/5ML syrup, Take 10 mLs by mouth every 4 (four) hours as needed for cough., Disp: 118 mL, Rfl: 0   insulin degludec (TRESIBA FLEXTOUCH) 100 UNIT/ML FlexTouch Pen, Inject 15 Units into the skin daily., Disp: , Rfl:    Ipratropium-Albuterol (COMBIVENT) 20-100 MCG/ACT AERS respimat, Inhale 1 puff into the lungs every 6 (six) hours., Disp: 4 g, Rfl: 1   lenalidomide (REVLIMID) 10 MG capsule, Take 1 capsule (10 mg total) by mouth daily. Take 10 mg daily for 14 days and then off 7 days ( start the meds on 04/03/2022) obtained1/03/2022, REMS: [id number], Disp: 14 capsule, Rfl: 0   losartan (COZAAR)  25 MG tablet, Take 1 tablet (25 mg total) by mouth daily., Disp: 30 tablet, Rfl: 1   Multiple Vitamin (MULTIVITAMIN WITH MINERALS) TABS tablet, Take 1 tablet by mouth daily., Disp: 90 tablet, Rfl: 1   NOVOLOG 100 UNIT/ML injection, Inject 0-9  Units into the skin 3 (three) times daily with meals., Disp: 10 mL, Rfl: 1   ondansetron (ZOFRAN) 8 MG tablet, Take 8 mg by mouth daily as needed for nausea or vomiting., Disp: , Rfl:    prochlorperazine (COMPAZINE) 10 MG tablet, Take 10 mg by mouth daily as needed for nausea or vomiting., Disp: , Rfl:    zinc sulfate 220 (50 Zn) MG capsule, Take 1 capsule (220 mg total) by mouth daily., Disp: 90 capsule, Rfl: 0  Physical exam:  Vitals:   04/17/22 1011  BP: 106/79  Pulse: 81  Resp: 18  Temp: (!) 93 F (33.9 C)  TempSrc: Tympanic  SpO2: 100%  Weight: 130 lb (59 kg)   Physical Exam Constitutional:      Comments: He is thin and cachectic.  Appears fatigued  Cardiovascular:     Rate and Rhythm: Normal rate and regular rhythm.     Heart sounds: Normal heart sounds.  Pulmonary:     Effort: Pulmonary effort is normal.     Breath sounds: Normal breath sounds.  Abdominal:     General: Bowel sounds are normal.     Palpations: Abdomen is soft.  Skin:    General: Skin is warm and dry.  Neurological:     Mental Status: He is alert and oriented to person, place, and time.         Latest Ref Rng & Units 04/17/2022    9:43 AM  CMP  Glucose 70 - 99 mg/dL 131   BUN 8 - 23 mg/dL 63   Creatinine 0.61 - 1.24 mg/dL 2.92   Sodium 135 - 145 mmol/L 142   Potassium 3.5 - 5.1 mmol/L 3.8   Chloride 98 - 111 mmol/L 105   CO2 22 - 32 mmol/L 27   Calcium 8.9 - 10.3 mg/dL 9.4   Total Protein 6.5 - 8.1 g/dL 9.1   Total Bilirubin 0.3 - 1.2 mg/dL 0.6   Alkaline Phos 38 - 126 U/L 98   AST 15 - 41 U/L 14   ALT 0 - 44 U/L 16       Latest Ref Rng & Units 04/17/2022    9:43 AM  CBC  WBC 4.0 - 10.5 K/uL 3.2   Hemoglobin 13.0 - 17.0 g/dL 11.0   Hematocrit 39.0  - 52.0 % 35.1   Platelets 150 - 400 K/uL 409     No images are attached to the encounter.  US Venous Img Lower Unilateral Left  Result Date: 04/09/2022 CLINICAL DATA:  Left lower extremity edema. EXAM: LEFT LOWER EXTREMITY VENOUS DOPPLER ULTRASOUND TECHNIQUE: Gray-scale sonography with graded compression, as well as color Doppler and duplex ultrasound were performed to evaluate the lower extremity deep venous systems from the level of the common femoral vein and including the common femoral, femoral, profunda femoral, popliteal and calf veins including the posterior tibial, peroneal and gastrocnemius veins when visible. The superficial great saphenous vein was also interrogated. Spectral Doppler was utilized to evaluate flow at rest and with distal augmentation maneuvers in the common femoral, femoral and popliteal veins. COMPARISON:  None Available. FINDINGS: Contralateral Common Femoral Vein: Respiratory phasicity is normal and symmetric with the symptomatic side. No evidence of thrombus. Normal compressibility. Common Femoral Vein: No evidence of thrombus. Normal compressibility, respiratory phasicity and response to augmentation. Saphenofemoral Junction: No evidence of thrombus. Normal compressibility and flow on color Doppler imaging. Profunda Femoral Vein: No evidence of thrombus. Normal compressibility and flow on color Doppler imaging. Femoral Vein:  No evidence of thrombus. Normal compressibility, respiratory phasicity and response to augmentation. Popliteal Vein: No evidence of thrombus. Normal compressibility, respiratory phasicity and response to augmentation. Calf Veins: No evidence of thrombus. Normal compressibility and flow on color Doppler imaging. Superficial Great Saphenous Vein: No evidence of thrombus. Normal compressibility. Venous Reflux:  None. Other Findings: No evidence of superficial thrombophlebitis or abnormal fluid collection. IMPRESSION: No evidence of left lower extremity deep  venous thrombosis. Electronically Signed   By: Aletta Edouard M.D.   On: 04/09/2022 14:41   DG Chest Port 1 View  Result Date: 04/06/2022 CLINICAL DATA:  86247 Dizziness [id number] EXAM: PORTABLE CHEST - 1 VIEW COMPARISON:  02/24/2022. FINDINGS: Cardiac silhouette is unremarkable. No pneumothorax or pleural effusion. The lungs are clear. Aorta is calcified. The visualized skeletal structures are unremarkable. IMPRESSION: No acute cardiopulmonary process. Electronically Signed   By: Sammie Bench M.D.   On: 04/06/2022 21:16     Assessment and plan- Patient is a 72 y.o. male with R-ISS stage III high risk IgG lambda multiple myeloma.  S/p 7 cycles of and is here for on treatment assessment prior to cycle 1 day 1 of D VRD chemotherapy  After 7 cycles of Revlimid and Velcade patient's M protein had plateaued of between 1.6 to 2.1 g without any further decrease.  Also his lambda free light chain has not been trending down consistently in the last 1 month.  Therefore my plan was to add daratumumab to the Velcade Revlimid regimen.  He was supposed to receive cycle 1 today but overall patient feels poorly after he was diagnosed with COVID 2 weeks ago.  He has lost 20 pounds of weight in the last 2 weeks.  Renal functions are worse and his creatinine is up to 2.9 today as compared to his baseline of 1.5-2.  I am therefore holding off on treatment today and I will give him 1 L of IV fluids today.  He will proceed with cycle 1 day 1 of D VRD chemotherapy next week and I will see him back in 2 weeks for cycle 1 day 8   Visit Diagnosis 1. AKI (acute kidney injury) ([place])   2. Multiple myeloma not having achieved remission ([place])      Dr. Randa Evens, MD, MPH [facility] [phone] 04/17/2022 12:47 PM

## 2022-04-17 NOTE — Patient Instructions (Signed)
Dehydration, Adult Dehydration is when you lose more fluids from the body than you take in. Vital organs like the kidneys, brain, and heart cannot function without a proper amount of fluids and salt. Any loss of fluids from the body can cause dehydration.  CAUSES   Vomiting.  Diarrhea.  Excessive sweating.  Excessive urine output.  Fever. SYMPTOMS  Mild dehydration  Thirst.  Dry lips.  Slightly dry mouth. Moderate dehydration  Very dry mouth.  Sunken eyes.  Skin does not bounce back quickly when lightly pinched and released.  Dark urine and decreased urine production.  

## 2022-04-20 ENCOUNTER — Other Ambulatory Visit: Payer: Self-pay

## 2022-04-20 ENCOUNTER — Encounter: Payer: Self-pay | Admitting: Emergency Medicine

## 2022-04-20 DIAGNOSIS — Z681 Body mass index (BMI) 19 or less, adult: Secondary | ICD-10-CM

## 2022-04-20 DIAGNOSIS — Z8711 Personal history of peptic ulcer disease: Secondary | ICD-10-CM

## 2022-04-20 DIAGNOSIS — D631 Anemia in chronic kidney disease: Secondary | ICD-10-CM | POA: Diagnosis present

## 2022-04-20 DIAGNOSIS — Z8249 Family history of ischemic heart disease and other diseases of the circulatory system: Secondary | ICD-10-CM

## 2022-04-20 DIAGNOSIS — L089 Local infection of the skin and subcutaneous tissue, unspecified: Secondary | ICD-10-CM | POA: Diagnosis present

## 2022-04-20 DIAGNOSIS — E10649 Type 1 diabetes mellitus with hypoglycemia without coma: Secondary | ICD-10-CM | POA: Diagnosis not present

## 2022-04-20 DIAGNOSIS — E1069 Type 1 diabetes mellitus with other specified complication: Secondary | ICD-10-CM | POA: Diagnosis not present

## 2022-04-20 DIAGNOSIS — F14129 Cocaine abuse with intoxication, unspecified: Secondary | ICD-10-CM | POA: Diagnosis present

## 2022-04-20 DIAGNOSIS — Z8673 Personal history of transient ischemic attack (TIA), and cerebral infarction without residual deficits: Secondary | ICD-10-CM

## 2022-04-20 DIAGNOSIS — N179 Acute kidney failure, unspecified: Secondary | ICD-10-CM | POA: Diagnosis present

## 2022-04-20 DIAGNOSIS — Z79899 Other long term (current) drug therapy: Secondary | ICD-10-CM

## 2022-04-20 DIAGNOSIS — E1022 Type 1 diabetes mellitus with diabetic chronic kidney disease: Secondary | ICD-10-CM | POA: Diagnosis present

## 2022-04-20 DIAGNOSIS — L97529 Non-pressure chronic ulcer of other part of left foot with unspecified severity: Secondary | ICD-10-CM | POA: Diagnosis present

## 2022-04-20 DIAGNOSIS — C9000 Multiple myeloma not having achieved remission: Secondary | ICD-10-CM | POA: Diagnosis present

## 2022-04-20 DIAGNOSIS — R7881 Bacteremia: Secondary | ICD-10-CM | POA: Diagnosis present

## 2022-04-20 DIAGNOSIS — E10621 Type 1 diabetes mellitus with foot ulcer: Secondary | ICD-10-CM | POA: Diagnosis present

## 2022-04-20 DIAGNOSIS — E43 Unspecified severe protein-calorie malnutrition: Secondary | ICD-10-CM | POA: Diagnosis present

## 2022-04-20 DIAGNOSIS — M87878 Other osteonecrosis, left toe(s): Secondary | ICD-10-CM | POA: Diagnosis present

## 2022-04-20 DIAGNOSIS — L97328 Non-pressure chronic ulcer of left ankle with other specified severity: Secondary | ICD-10-CM | POA: Diagnosis present

## 2022-04-20 DIAGNOSIS — Z7982 Long term (current) use of aspirin: Secondary | ICD-10-CM

## 2022-04-20 DIAGNOSIS — N1831 Chronic kidney disease, stage 3a: Secondary | ICD-10-CM | POA: Diagnosis present

## 2022-04-20 DIAGNOSIS — F1210 Cannabis abuse, uncomplicated: Secondary | ICD-10-CM | POA: Diagnosis present

## 2022-04-20 DIAGNOSIS — I4891 Unspecified atrial fibrillation: Secondary | ICD-10-CM | POA: Diagnosis present

## 2022-04-20 DIAGNOSIS — I440 Atrioventricular block, first degree: Secondary | ICD-10-CM | POA: Diagnosis present

## 2022-04-20 DIAGNOSIS — I951 Orthostatic hypotension: Secondary | ICD-10-CM | POA: Diagnosis not present

## 2022-04-20 DIAGNOSIS — Z794 Long term (current) use of insulin: Secondary | ICD-10-CM

## 2022-04-20 DIAGNOSIS — E875 Hyperkalemia: Secondary | ICD-10-CM | POA: Diagnosis present

## 2022-04-20 DIAGNOSIS — Z91148 Patient's other noncompliance with medication regimen for other reason: Secondary | ICD-10-CM

## 2022-04-20 DIAGNOSIS — I129 Hypertensive chronic kidney disease with stage 1 through stage 4 chronic kidney disease, or unspecified chronic kidney disease: Secondary | ICD-10-CM | POA: Diagnosis present

## 2022-04-20 DIAGNOSIS — E1010 Type 1 diabetes mellitus with ketoacidosis without coma: Secondary | ICD-10-CM | POA: Diagnosis not present

## 2022-04-20 DIAGNOSIS — E7800 Pure hypercholesterolemia, unspecified: Secondary | ICD-10-CM | POA: Diagnosis present

## 2022-04-20 DIAGNOSIS — Z91199 Patient's noncompliance with other medical treatment and regimen due to unspecified reason: Secondary | ICD-10-CM

## 2022-04-20 DIAGNOSIS — Z5320 Procedure and treatment not carried out because of patient's decision for unspecified reasons: Secondary | ICD-10-CM | POA: Diagnosis not present

## 2022-04-20 DIAGNOSIS — Z8616 Personal history of COVID-19: Secondary | ICD-10-CM

## 2022-04-20 DIAGNOSIS — Z880 Allergy status to penicillin: Secondary | ICD-10-CM

## 2022-04-20 DIAGNOSIS — E860 Dehydration: Secondary | ICD-10-CM | POA: Diagnosis present

## 2022-04-20 DIAGNOSIS — M868X7 Other osteomyelitis, ankle and foot: Secondary | ICD-10-CM | POA: Diagnosis present

## 2022-04-20 DIAGNOSIS — E1040 Type 1 diabetes mellitus with diabetic neuropathy, unspecified: Secondary | ICD-10-CM | POA: Diagnosis present

## 2022-04-20 DIAGNOSIS — M19072 Primary osteoarthritis, left ankle and foot: Secondary | ICD-10-CM | POA: Diagnosis present

## 2022-04-20 DIAGNOSIS — Z7902 Long term (current) use of antithrombotics/antiplatelets: Secondary | ICD-10-CM

## 2022-04-20 DIAGNOSIS — B955 Unspecified streptococcus as the cause of diseases classified elsewhere: Secondary | ICD-10-CM | POA: Diagnosis present

## 2022-04-20 DIAGNOSIS — D849 Immunodeficiency, unspecified: Secondary | ICD-10-CM | POA: Diagnosis present

## 2022-04-20 LAB — BASIC METABOLIC PANEL
Anion gap: 30 — ABNORMAL HIGH (ref 5–15)
BUN: 89 mg/dL — ABNORMAL HIGH (ref 8–23)
CO2: 8 mmol/L — ABNORMAL LOW (ref 22–32)
Calcium: 8.5 mg/dL — ABNORMAL LOW (ref 8.9–10.3)
Chloride: 99 mmol/L (ref 98–111)
Creatinine, Ser: 3.88 mg/dL — ABNORMAL HIGH (ref 0.61–1.24)
GFR, Estimated: 16 mL/min — ABNORMAL LOW (ref >60)
Glucose, Bld: 897 mg/dL (ref 70–99)
Potassium: >7.5 mmol/L (ref 3.5–5.1)
Sodium: 137 mmol/L (ref 135–145)

## 2022-04-20 LAB — CBC
HCT: 38 % — ABNORMAL LOW (ref 39.0–52.0)
Hemoglobin: 10.5 g/dL — ABNORMAL LOW (ref 13.0–17.0)
MCH: 29.2 pg (ref 26.0–34.0)
MCHC: 27.6 g/dL — ABNORMAL LOW (ref 30.0–36.0)
MCV: 105.8 fL — ABNORMAL HIGH (ref 80.0–100.0)
Platelets: 303 10*3/uL (ref 150–400)
RBC: 3.59 MIL/uL — ABNORMAL LOW (ref 4.22–5.81)
RDW: 16 % — ABNORMAL HIGH (ref 11.5–15.5)
WBC: 5.9 10*3/uL (ref 4.0–10.5)
nRBC: 0 % (ref 0.0–0.2)

## 2022-04-20 LAB — CBG MONITORING, ED: Glucose-Capillary: >600 mg/dL (ref 70–99)

## 2022-04-20 MED ORDER — SODIUM CHLORIDE 0.9 % IV BOLUS (SEPSIS)
1000.0000 mL | Freq: Once | INTRAVENOUS | Status: AC
  Administered 2022-04-20: 1000 mL via INTRAVENOUS

## 2022-04-21 ENCOUNTER — Encounter: Payer: Self-pay | Admitting: Internal Medicine

## 2022-04-21 ENCOUNTER — Inpatient Hospital Stay: Payer: 59

## 2022-04-21 ENCOUNTER — Other Ambulatory Visit: Payer: Self-pay

## 2022-04-21 ENCOUNTER — Inpatient Hospital Stay
Admission: EM | Admit: 2022-04-21 | Discharge: 2022-04-29 | DRG: 616 | Disposition: A | Discharge status: Home / Self Care | Payer: 59 | Attending: Internal Medicine | Admitting: Internal Medicine

## 2022-04-21 DIAGNOSIS — E08621 Diabetes mellitus due to underlying condition with foot ulcer: Secondary | ICD-10-CM | POA: Diagnosis not present

## 2022-04-21 DIAGNOSIS — E162 Hypoglycemia, unspecified: Secondary | ICD-10-CM | POA: Diagnosis not present

## 2022-04-21 DIAGNOSIS — M86672 Other chronic osteomyelitis, left ankle and foot: Secondary | ICD-10-CM | POA: Diagnosis not present

## 2022-04-21 DIAGNOSIS — I10 Essential (primary) hypertension: Secondary | ICD-10-CM | POA: Diagnosis present

## 2022-04-21 DIAGNOSIS — D638 Anemia in other chronic diseases classified elsewhere: Secondary | ICD-10-CM | POA: Diagnosis present

## 2022-04-21 DIAGNOSIS — N1831 Chronic kidney disease, stage 3a: Secondary | ICD-10-CM | POA: Diagnosis present

## 2022-04-21 DIAGNOSIS — Z8673 Personal history of transient ischemic attack (TIA), and cerebral infarction without residual deficits: Secondary | ICD-10-CM | POA: Diagnosis not present

## 2022-04-21 DIAGNOSIS — I951 Orthostatic hypotension: Secondary | ICD-10-CM | POA: Diagnosis not present

## 2022-04-21 DIAGNOSIS — E10649 Type 1 diabetes mellitus with hypoglycemia without coma: Secondary | ICD-10-CM | POA: Diagnosis not present

## 2022-04-21 DIAGNOSIS — N179 Acute kidney failure, unspecified: Secondary | ICD-10-CM | POA: Diagnosis present

## 2022-04-21 DIAGNOSIS — E43 Unspecified severe protein-calorie malnutrition: Secondary | ICD-10-CM | POA: Diagnosis present

## 2022-04-21 DIAGNOSIS — C9000 Multiple myeloma not having achieved remission: Secondary | ICD-10-CM | POA: Diagnosis not present

## 2022-04-21 DIAGNOSIS — I4891 Unspecified atrial fibrillation: Secondary | ICD-10-CM | POA: Diagnosis present

## 2022-04-21 DIAGNOSIS — E0810 Diabetes mellitus due to underlying condition with ketoacidosis without coma: Secondary | ICD-10-CM

## 2022-04-21 DIAGNOSIS — M8618 Other acute osteomyelitis, other site: Secondary | ICD-10-CM | POA: Diagnosis not present

## 2022-04-21 DIAGNOSIS — D849 Immunodeficiency, unspecified: Secondary | ICD-10-CM | POA: Diagnosis present

## 2022-04-21 DIAGNOSIS — R7881 Bacteremia: Secondary | ICD-10-CM | POA: Diagnosis present

## 2022-04-21 DIAGNOSIS — Z8579 Personal history of other malignant neoplasms of lymphoid, hematopoietic and related tissues: Secondary | ICD-10-CM

## 2022-04-21 DIAGNOSIS — L97529 Non-pressure chronic ulcer of other part of left foot with unspecified severity: Secondary | ICD-10-CM | POA: Diagnosis present

## 2022-04-21 DIAGNOSIS — Z794 Long term (current) use of insulin: Secondary | ICD-10-CM | POA: Diagnosis not present

## 2022-04-21 DIAGNOSIS — E1022 Type 1 diabetes mellitus with diabetic chronic kidney disease: Secondary | ICD-10-CM | POA: Diagnosis present

## 2022-04-21 DIAGNOSIS — D649 Anemia, unspecified: Secondary | ICD-10-CM | POA: Diagnosis present

## 2022-04-21 DIAGNOSIS — F14129 Cocaine abuse with intoxication, unspecified: Secondary | ICD-10-CM | POA: Diagnosis present

## 2022-04-21 DIAGNOSIS — E1169 Type 2 diabetes mellitus with other specified complication: Secondary | ICD-10-CM | POA: Diagnosis not present

## 2022-04-21 DIAGNOSIS — M87878 Other osteonecrosis, left toe(s): Secondary | ICD-10-CM | POA: Diagnosis present

## 2022-04-21 DIAGNOSIS — E1069 Type 1 diabetes mellitus with other specified complication: Secondary | ICD-10-CM | POA: Diagnosis not present

## 2022-04-21 DIAGNOSIS — L97328 Non-pressure chronic ulcer of left ankle with other specified severity: Secondary | ICD-10-CM | POA: Diagnosis present

## 2022-04-21 DIAGNOSIS — R634 Abnormal weight loss: Secondary | ICD-10-CM | POA: Diagnosis present

## 2022-04-21 DIAGNOSIS — E1110 Type 2 diabetes mellitus with ketoacidosis without coma: Secondary | ICD-10-CM | POA: Diagnosis present

## 2022-04-21 DIAGNOSIS — B955 Unspecified streptococcus as the cause of diseases classified elsewhere: Secondary | ICD-10-CM | POA: Diagnosis not present

## 2022-04-21 DIAGNOSIS — M868X7 Other osteomyelitis, ankle and foot: Secondary | ICD-10-CM | POA: Diagnosis present

## 2022-04-21 DIAGNOSIS — E1040 Type 1 diabetes mellitus with diabetic neuropathy, unspecified: Secondary | ICD-10-CM | POA: Diagnosis present

## 2022-04-21 DIAGNOSIS — E1010 Type 1 diabetes mellitus with ketoacidosis without coma: Secondary | ICD-10-CM | POA: Diagnosis present

## 2022-04-21 DIAGNOSIS — M86072 Acute hematogenous osteomyelitis, left ankle and foot: Secondary | ICD-10-CM | POA: Diagnosis not present

## 2022-04-21 DIAGNOSIS — I129 Hypertensive chronic kidney disease with stage 1 through stage 4 chronic kidney disease, or unspecified chronic kidney disease: Secondary | ICD-10-CM | POA: Diagnosis present

## 2022-04-21 DIAGNOSIS — A419 Sepsis, unspecified organism: Secondary | ICD-10-CM | POA: Diagnosis present

## 2022-04-21 DIAGNOSIS — D631 Anemia in chronic kidney disease: Secondary | ICD-10-CM | POA: Diagnosis present

## 2022-04-21 DIAGNOSIS — D72819 Decreased white blood cell count, unspecified: Secondary | ICD-10-CM | POA: Diagnosis not present

## 2022-04-21 DIAGNOSIS — E785 Hyperlipidemia, unspecified: Secondary | ICD-10-CM | POA: Diagnosis present

## 2022-04-21 DIAGNOSIS — F1911 Other psychoactive substance abuse, in remission: Secondary | ICD-10-CM | POA: Diagnosis present

## 2022-04-21 DIAGNOSIS — L97522 Non-pressure chronic ulcer of other part of left foot with fat layer exposed: Secondary | ICD-10-CM | POA: Diagnosis not present

## 2022-04-21 DIAGNOSIS — E875 Hyperkalemia: Secondary | ICD-10-CM

## 2022-04-21 DIAGNOSIS — E10621 Type 1 diabetes mellitus with foot ulcer: Secondary | ICD-10-CM | POA: Diagnosis present

## 2022-04-21 DIAGNOSIS — E11621 Type 2 diabetes mellitus with foot ulcer: Secondary | ICD-10-CM | POA: Diagnosis not present

## 2022-04-21 DIAGNOSIS — Z681 Body mass index (BMI) 19 or less, adult: Secondary | ICD-10-CM | POA: Diagnosis not present

## 2022-04-21 DIAGNOSIS — M869 Osteomyelitis, unspecified: Secondary | ICD-10-CM | POA: Diagnosis present

## 2022-04-21 DIAGNOSIS — L97509 Non-pressure chronic ulcer of other part of unspecified foot with unspecified severity: Secondary | ICD-10-CM | POA: Diagnosis not present

## 2022-04-21 DIAGNOSIS — Z8616 Personal history of COVID-19: Secondary | ICD-10-CM | POA: Diagnosis not present

## 2022-04-21 DIAGNOSIS — L97502 Non-pressure chronic ulcer of other part of unspecified foot with fat layer exposed: Secondary | ICD-10-CM | POA: Diagnosis not present

## 2022-04-21 LAB — CBC
HCT: 29.7 % — ABNORMAL LOW (ref 39.0–52.0)
Hemoglobin: 8.9 g/dL — ABNORMAL LOW (ref 13.0–17.0)
MCH: 29.3 pg (ref 26.0–34.0)
MCHC: 30 g/dL (ref 30.0–36.0)
MCV: 97.7 fL (ref 80.0–100.0)
Platelets: 282 10*3/uL (ref 150–400)
RBC: 3.04 MIL/uL — ABNORMAL LOW (ref 4.22–5.81)
RDW: 15.9 % — ABNORMAL HIGH (ref 11.5–15.5)
WBC: 5.6 10*3/uL (ref 4.0–10.5)
nRBC: 0 % (ref 0.0–0.2)

## 2022-04-21 LAB — URINALYSIS, COMPLETE (UACMP) WITH MICROSCOPIC
Bilirubin Urine: NEGATIVE
Glucose, UA: >=500 mg/dL — AB
Hgb urine dipstick: NEGATIVE
Ketones, ur: 5 mg/dL — AB
Leukocytes,Ua: NEGATIVE
Nitrite: NEGATIVE
Protein, ur: NEGATIVE mg/dL
Specific Gravity, Urine: 1.014 (ref 1.005–1.030)
Squamous Epithelial / HPF: NONE SEEN /HPF (ref 0–5)
pH: 5 (ref 5.0–8.0)

## 2022-04-21 LAB — BASIC METABOLIC PANEL
Anion gap: 17 — ABNORMAL HIGH (ref 5–15)
Anion gap: 14 (ref 5–15)
Anion gap: 8 (ref 5–15)
BUN: 87 mg/dL — ABNORMAL HIGH (ref 8–23)
BUN: 85 mg/dL — ABNORMAL HIGH (ref 8–23)
BUN: 81 mg/dL — ABNORMAL HIGH (ref 8–23)
CO2: 14 mmol/L — ABNORMAL LOW (ref 22–32)
CO2: 16 mmol/L — ABNORMAL LOW (ref 22–32)
CO2: 23 mmol/L (ref 22–32)
Calcium: 7.8 mg/dL — ABNORMAL LOW (ref 8.9–10.3)
Calcium: 7.8 mg/dL — ABNORMAL LOW (ref 8.9–10.3)
Calcium: 8.4 mg/dL — ABNORMAL LOW (ref 8.9–10.3)
Chloride: 110 mmol/L (ref 98–111)
Chloride: 112 mmol/L — ABNORMAL HIGH (ref 98–111)
Chloride: 117 mmol/L — ABNORMAL HIGH (ref 98–111)
Creatinine, Ser: 3.52 mg/dL — ABNORMAL HIGH (ref 0.61–1.24)
Creatinine, Ser: 3.35 mg/dL — ABNORMAL HIGH (ref 0.61–1.24)
Creatinine, Ser: 2.96 mg/dL — ABNORMAL HIGH (ref 0.61–1.24)
GFR, Estimated: 18 mL/min — ABNORMAL LOW (ref >60)
GFR, Estimated: 19 mL/min — ABNORMAL LOW (ref >60)
GFR, Estimated: 22 mL/min — ABNORMAL LOW (ref >60)
Glucose, Bld: 703 mg/dL (ref 70–99)
Glucose, Bld: 580 mg/dL (ref 70–99)
Glucose, Bld: 175 mg/dL — ABNORMAL HIGH (ref 70–99)
Potassium: 4.9 mmol/L (ref 3.5–5.1)
Potassium: 4.4 mmol/L (ref 3.5–5.1)
Potassium: 3.8 mmol/L (ref 3.5–5.1)
Sodium: 141 mmol/L (ref 135–145)
Sodium: 142 mmol/L (ref 135–145)
Sodium: 148 mmol/L — ABNORMAL HIGH (ref 135–145)

## 2022-04-21 LAB — URINE DRUG SCREEN, QUALITATIVE (ARMC ONLY)
Amphetamines, Ur Screen: NOT DETECTED
Barbiturates, Ur Screen: NOT DETECTED
Benzodiazepine, Ur Scrn: NOT DETECTED
Cannabinoid 50 Ng, Ur ~~LOC~~: POSITIVE — AB
Cocaine Metabolite,Ur ~~LOC~~: POSITIVE — AB
MDMA (Ecstasy)Ur Screen: NOT DETECTED
Methadone Scn, Ur: NOT DETECTED
Opiate, Ur Screen: NOT DETECTED
Phencyclidine (PCP) Ur S: NOT DETECTED
Tricyclic, Ur Screen: NOT DETECTED

## 2022-04-21 LAB — LACTIC ACID, PLASMA
Lactic Acid, Venous: 4.8 mmol/L (ref 0.5–1.9)
Lactic Acid, Venous: 2.2 mmol/L (ref 0.5–1.9)

## 2022-04-21 LAB — CBG MONITORING, ED
Glucose-Capillary: >600 mg/dL (ref 70–99)
Glucose-Capillary: >600 mg/dL (ref 70–99)
Glucose-Capillary: >600 mg/dL (ref 70–99)
Glucose-Capillary: 555 mg/dL (ref 70–99)
Glucose-Capillary: >600 mg/dL (ref 70–99)
Glucose-Capillary: 568 mg/dL (ref 70–99)
Glucose-Capillary: 531 mg/dL (ref 70–99)
Glucose-Capillary: 567 mg/dL (ref 70–99)
Glucose-Capillary: 464 mg/dL — ABNORMAL HIGH (ref 70–99)
Glucose-Capillary: 513 mg/dL (ref 70–99)
Glucose-Capillary: 423 mg/dL — ABNORMAL HIGH (ref 70–99)
Glucose-Capillary: 454 mg/dL — ABNORMAL HIGH (ref 70–99)
Glucose-Capillary: 431 mg/dL — ABNORMAL HIGH (ref 70–99)
Glucose-Capillary: 289 mg/dL — ABNORMAL HIGH (ref 70–99)
Glucose-Capillary: 249 mg/dL — ABNORMAL HIGH (ref 70–99)
Glucose-Capillary: 273 mg/dL — ABNORMAL HIGH (ref 70–99)
Glucose-Capillary: 193 mg/dL — ABNORMAL HIGH (ref 70–99)
Glucose-Capillary: 124 mg/dL — ABNORMAL HIGH (ref 70–99)
Glucose-Capillary: 101 mg/dL — ABNORMAL HIGH (ref 70–99)
Glucose-Capillary: 111 mg/dL — ABNORMAL HIGH (ref 70–99)
Glucose-Capillary: 23 mg/dL — CL (ref 70–99)
Glucose-Capillary: 126 mg/dL — ABNORMAL HIGH (ref 70–99)
Glucose-Capillary: 92 mg/dL (ref 70–99)

## 2022-04-21 LAB — BLOOD GAS, VENOUS
Acid-base deficit: 21.3 mmol/L — ABNORMAL HIGH (ref 0.0–2.0)
Bicarbonate: 8.8 mmol/L — ABNORMAL LOW (ref 20.0–28.0)
O2 Saturation: 58.1 %
Patient temperature: 37
pCO2, Ven: 34 mmHg — ABNORMAL LOW (ref 44–60)
pH, Ven: 7.02 — CL (ref 7.25–7.43)
pO2, Ven: 51 mmHg — ABNORMAL HIGH (ref 32–45)

## 2022-04-21 LAB — BETA-HYDROXYBUTYRIC ACID
Beta-Hydroxybutyric Acid: >8 mmol/L — ABNORMAL HIGH (ref 0.05–0.27)
Beta-Hydroxybutyric Acid: 0.09 mmol/L (ref 0.05–0.27)

## 2022-04-21 LAB — PROCALCITONIN: Procalcitonin: 0.34 ng/mL

## 2022-04-21 LAB — BLOOD GAS, ARTERIAL
Acid-base deficit: 10.5 mmol/L — ABNORMAL HIGH (ref 0.0–2.0)
Bicarbonate: 13.5 mmol/L — ABNORMAL LOW (ref 20.0–28.0)
O2 Saturation: 99.5 %
Patient temperature: 37
pCO2 arterial: 25 mmHg — ABNORMAL LOW (ref 32–48)
pH, Arterial: 7.34 — ABNORMAL LOW (ref 7.35–7.45)
pO2, Arterial: 173 mmHg — ABNORMAL HIGH (ref 83–108)

## 2022-04-21 LAB — PHOSPHORUS: Phosphorus: 7.4 mg/dL — ABNORMAL HIGH (ref 2.5–4.6)

## 2022-04-21 LAB — MAGNESIUM: Magnesium: 2.4 mg/dL (ref 1.7–2.4)

## 2022-04-21 MED ORDER — SODIUM CHLORIDE 0.9 % IV BOLUS
1000.0000 mL | Freq: Once | INTRAVENOUS | Status: AC
  Administered 2022-04-21: 1000 mL via INTRAVENOUS

## 2022-04-21 MED ORDER — HEPARIN SODIUM (PORCINE) 5000 UNIT/ML IJ SOLN
5000.0000 [IU] | Freq: Three times a day (TID) | INTRAMUSCULAR | Status: DC
  Administered 2022-04-21 – 2022-04-27 (×13): 5000 [IU] via SUBCUTANEOUS
  Filled 2022-04-21 (×14): qty 1

## 2022-04-21 MED ORDER — INSULIN REGULAR(HUMAN) IN NACL 100-0.9 UT/100ML-% IV SOLN
INTRAVENOUS | Status: AC
  Administered 2022-04-21: 5.5 [IU]/h via INTRAVENOUS
  Filled 2022-04-21: qty 100

## 2022-04-21 MED ORDER — LACTATED RINGERS IV SOLN: INTRAVENOUS | Status: DC

## 2022-04-21 MED ORDER — INSULIN DETEMIR 100 UNIT/ML ~~LOC~~ SOLN
0.3000 [IU]/kg | Freq: Every day | SUBCUTANEOUS | Status: DC
  Administered 2022-04-21: 18 [IU] via SUBCUTANEOUS
  Filled 2022-04-21 (×2): qty 0.18

## 2022-04-21 MED ORDER — DEXTROSE IN LACTATED RINGERS 5 % IV SOLN: INTRAVENOUS | Status: DC

## 2022-04-21 MED ORDER — SODIUM CHLORIDE 0.45 % IV SOLN: INTRAVENOUS | Status: DC

## 2022-04-21 MED ORDER — SODIUM CHLORIDE 0.9 % IV SOLN: INTRAVENOUS | Status: DC

## 2022-04-21 MED ORDER — POLYETHYLENE GLYCOL 3350 17 G PO PACK: 17.0000 g | PACK | Freq: Every day | ORAL | Status: DC | PRN

## 2022-04-21 MED ORDER — SODIUM CHLORIDE 0.9 % IV SOLN
12.5000 mg | Freq: Four times a day (QID) | INTRAVENOUS | Status: DC | PRN
  Filled 2022-04-21: qty 0.5

## 2022-04-21 MED ORDER — SODIUM ZIRCONIUM CYCLOSILICATE 10 G PO PACK
10.0000 g | PACK | Freq: Once | ORAL | Status: AC
  Administered 2022-04-21: 10 g via ORAL
  Filled 2022-04-21: qty 1

## 2022-04-21 MED ORDER — INSULIN ASPART 100 UNIT/ML IJ SOLN
0.0000 [IU] | Freq: Three times a day (TID) | INTRAMUSCULAR | Status: DC
  Administered 2022-04-22: 1 [IU] via SUBCUTANEOUS
  Administered 2022-04-23: 5 [IU] via SUBCUTANEOUS
  Administered 2022-04-23: 9 [IU] via SUBCUTANEOUS
  Administered 2022-04-24: 3 [IU] via SUBCUTANEOUS
  Administered 2022-04-26: 1 [IU] via SUBCUTANEOUS
  Administered 2022-04-26: 7 [IU] via SUBCUTANEOUS
  Administered 2022-04-27: 2 [IU] via SUBCUTANEOUS
  Administered 2022-04-28: 9 [IU] via SUBCUTANEOUS
  Administered 2022-04-29: 3 [IU] via SUBCUTANEOUS
  Filled 2022-04-21 (×11): qty 1

## 2022-04-21 MED ORDER — AMOXICILLIN-POT CLAVULANATE 500-125 MG PO TABS
1.0000 | ORAL_TABLET | Freq: Two times a day (BID) | ORAL | Status: DC
  Administered 2022-04-21 – 2022-04-22 (×3): 1 via ORAL
  Filled 2022-04-21 (×3): qty 1

## 2022-04-21 MED ORDER — CALCIUM GLUCONATE 10 % IV SOLN
1.0000 g | Freq: Once | INTRAVENOUS | Status: AC
  Administered 2022-04-21: 1 g via INTRAVENOUS
  Filled 2022-04-21: qty 10

## 2022-04-21 MED ORDER — DEXTROSE 50 % IV SOLN
0.0000 mL | INTRAVENOUS | Status: DC | PRN
  Administered 2022-04-21 – 2022-04-25 (×7): 50 mL via INTRAVENOUS
  Filled 2022-04-21 (×7): qty 50

## 2022-04-21 MED ORDER — INSULIN ASPART 100 UNIT/ML IJ SOLN: 0.0000 [IU] | Freq: Every day | INTRAMUSCULAR | Status: DC

## 2022-04-21 MED ORDER — DOCUSATE SODIUM 100 MG PO CAPS: 100.0000 mg | ORAL_CAPSULE | Freq: Two times a day (BID) | ORAL | Status: DC | PRN

## 2022-04-21 MED ORDER — ONDANSETRON HCL 4 MG/2ML IJ SOLN: 4.0000 mg | Freq: Four times a day (QID) | INTRAMUSCULAR | Status: DC | PRN

## 2022-04-21 NOTE — ED Provider Notes (Signed)
[facility] Provider Note    None    (approximate)   History   Hyperglycemia   HPI  Nicholas Weiss is a 72 y.o. male with a history of type 1 diabetes, DKA, multiple myeloma, CKD, and cocaine abuse who presents with nausea and vomiting, decreased p.o. intake, and hyperglycemia.  The patient states that he has been feeling sick for about a week and has not been able to eat or drink.  He states initially he did not have an appetite but then today started having vomiting.  He states that his sugars were low a few days ago but now have gone high.  He denies any abdominal pain.  I reviewed the past medical records.  The patient was admitted earlier this month.  Per the hospitalist discharge summary on 1/10 he presented with generalized weakness and malaise.  He was found to have a blood glucose in the 700s and was diagnosed with and treated for DKA.  He was also positive for COVID and was treated with Paxlovid.   Physical Exam   Triage Vital Signs: ED Triage Vitals  Enc Vitals Group     BP 04/20/22 2311 (!) 105/50     Pulse Rate 04/20/22 2311 (!) 110     Resp 04/20/22 2311 16     Temp 04/20/22 2311 97.7 F (36.5 C)     Temp Source 04/20/22 2311 Oral     SpO2 04/20/22 2311 100 %     Weight 04/20/22 2255 130 lb 13.8 oz (59.4 kg)     Height 04/20/22 2255 '5\' 10"'$  (1.778 m)     Head Circumference --      Peak Flow --      Pain Score --      Pain Loc --      Pain Edu? --      Excl. in [place]? --     Most recent vital signs: Vitals:   04/21/22 0200 04/21/22 0230  BP: (!) 148/97 (!) 146/86  Pulse: 95 (!) 102  Resp: 13 13  Temp:    SpO2: 100% 100%     General: Alert and oriented, no acute distress. CV:  Good peripheral perfusion.  Normal heart sounds. Resp:  Normal effort.  Lungs CTAB. Abd:  Soft and nontender.  No distention.  Other:  Dry mucous membranes.  EOMI.  PERRLA.  Motor intact in all extremities.   ED Results / Procedures / Treatments    Labs (all labs ordered are listed, but only abnormal results are displayed) Labs Reviewed  BASIC METABOLIC PANEL - Abnormal; Notable for the following components:      Result Value   Potassium >7.5 (*)    CO2 8 (*)    Glucose, Bld 897 (*)    BUN 89 (*)    Creatinine, Ser 3.88 (*)    Calcium 8.5 (*)    GFR, Estimated 16 (*)    Anion gap 30 (*)    All other components within normal limits  CBC - Abnormal; Notable for the following components:   RBC 3.59 (*)    Hemoglobin 10.5 (*)    HCT 38.0 (*)    MCV 105.8 (*)    MCHC 27.6 (*)    RDW 16.0 (*)    All other components within normal limits  BETA-HYDROXYBUTYRIC ACID - Abnormal; Notable for the following components:   Beta-Hydroxybutyric Acid >8.00 (*)    All other components within normal limits  LACTIC ACID, PLASMA -  Abnormal; Notable for the following components:   Lactic Acid, Venous 4.8 (*)    All other components within normal limits  BLOOD GAS, VENOUS - Abnormal; Notable for the following components:   pH, Ven 7.02 (*)    pCO2, Ven 34 (*)    pO2, Ven 51 (*)    Bicarbonate 8.8 (*)    Acid-base deficit 21.3 (*)    All other components within normal limits  CBG MONITORING, ED - Abnormal; Notable for the following components:   Glucose-Capillary >600 (*)    All other components within normal limits  CBG MONITORING, ED - Abnormal; Notable for the following components:   Glucose-Capillary >600 (*)    All other components within normal limits  CBG MONITORING, ED - Abnormal; Notable for the following components:   Glucose-Capillary >600 (*)    All other components within normal limits  CBG MONITORING, ED - Abnormal; Notable for the following components:   Glucose-Capillary >600 (*)    All other components within normal limits  CBG MONITORING, ED - Abnormal; Notable for the following components:   Glucose-Capillary >600 (*)    All other components within normal limits  CULTURE, BLOOD (ROUTINE X 2)  CULTURE, BLOOD  (ROUTINE X 2)  URINALYSIS, ROUTINE W REFLEX MICROSCOPIC  LACTIC ACID, PLASMA  LEGIONELLA PNEUMOPHILA SEROGP 1 UR AG  STREP PNEUMONIAE URINARY ANTIGEN  URINALYSIS, COMPLETE (UACMP) WITH MICROSCOPIC  PHOSPHORUS  BASIC METABOLIC PANEL  CBC  BLOOD GAS, ARTERIAL  MAGNESIUM  PROCALCITONIN     EKG  ED ECG REPORT I, Arta Silence, the attending physician, personally viewed and interpreted this ECG.  Date: 04/20/2022 EKG Time: 2304 Rate: 113 Rhythm: Sinus tachycardia QRS Axis: normal Intervals: Prolonged QTc ST/T Wave abnormalities: Peaked T waves Narrative Interpretation: Nonspecific ST abnormalities with no evidence of acute ischemia    RADIOLOGY     PROCEDURES:  Critical Care performed: Yes, see critical care procedure note(s)  .Critical Care  Performed by: Arta Silence, MD Authorized by: Arta Silence, MD   Critical care provider statement:    Critical care time (minutes):  45   Critical care time was exclusive of:  Separately billable procedures and treating other patients   Critical care was necessary to treat or prevent imminent or life-threatening deterioration of the following conditions:  Endocrine crisis and metabolic crisis   Critical care was time spent personally by me on the following activities:  Development of treatment plan with patient or surrogate, discussions with consultants, evaluation of patient's response to treatment, examination of patient, ordering and review of laboratory studies, ordering and review of radiographic studies, ordering and performing treatments and interventions, pulse oximetry, re-evaluation of patient's condition, review of old charts and obtaining history from patient or surrogate   Care discussed with: admitting provider      MEDICATIONS ORDERED IN ED: Medications  insulin regular, human (MYXREDLIN) 100 units/ 100 mL infusion (5.5 Units/hr Intravenous New Bag/Given 04/21/22 0056)  dextrose 5 % in lactated  ringers infusion (0 mLs Intravenous Hold 04/21/22 0120)  dextrose 50 % solution 0-50 mL (has no administration in time range)  docusate sodium (COLACE) capsule 100 mg (has no administration in time range)  polyethylene glycol (MIRALAX / GLYCOLAX) packet 17 g (has no administration in time range)  heparin injection 5,000 Units (has no administration in time range)  0.9 %  sodium chloride infusion ( Intravenous New Bag/Given 04/21/22 0231)  sodium chloride 0.9 % bolus 1,000 mL (0 mLs Intravenous Stopped 04/21/22 0105)  sodium  chloride 0.9 % bolus 1,000 mL (0 mLs Intravenous Stopped 04/21/22 0105)  sodium zirconium cyclosilicate (LOKELMA) packet 10 g (10 g Oral Given 04/21/22 0222)  calcium gluconate inj 10% (1 g) URGENT USE ONLY! (1 g Intravenous Given 04/21/22 0035)  sodium chloride 0.9 % bolus 1,000 mL (1,000 mLs Intravenous New Bag/Given 04/21/22 0233)     IMPRESSION / MDM / ASSESSMENT AND PLAN / ED COURSE  I reviewed the triage vital signs and the nursing notes.  72 year old male with PMH as noted above presents with nausea, vomiting, and decreased p.o. intake today as well as elevated blood glucose.  Labs and fluids were ordered in triage.  I was alerted by the charge RN that the labs revealed a potassium of greater than 7.5 and a glucose of 897.  I ordered that the patient immediately be brought back to her room.  At 1220, the patient was moved to a room.  I reviewed the EKG at that time which showed peaked T waves.  I ordered a push of IV calcium gluconate and for him to be started on an insulin infusion.  He has already received fluids while waiting.  Differential diagnosis includes, but is not limited to, DKA, HHS, acute kidney injury, sepsis.  Initial anion gap is 30.  We will continue and IV insulin infusion, fluids, give Lokelma, and consult the ICU for admission.  Patient's presentation is most consistent with acute presentation with potential threat to life or bodily function.  The  patient is on the cardiac monitor to evaluate for evidence of arrhythmia and/or significant heart rate changes.  ----------------------------------------- 2:58 AM on 04/21/2022 -----------------------------------------  VBG shows pH of 7.02.  Lactate is 4.8.  The heart rate has improved.  Vital signs are otherwise stable and the patient remains alert.  I consulted APP Ouma from the ICU; based our discussion she agrees to admit the patient.   FINAL CLINICAL IMPRESSION(S) / ED DIAGNOSES   Final diagnoses:  Diabetic ketoacidosis without coma associated with type 1 diabetes mellitus ([place])  Hyperkalemia     Rx / DC Orders   ED Discharge Orders     None        Note:  This document was prepared using Dragon voice recognition software and may include unintentional dictation errors.    Arta Silence, MD 04/21/22 [phone]

## 2022-04-21 NOTE — Consult Note (Signed)
PODIATRY CONSULTATION  NAME Nicholas Weiss MRN [id number] DOB 1950/12/15 DOA 04/21/2022   Reason for consult: Ulcer LT great toe Chief Complaint  Patient presents with   Hyperglycemia    Consulting physician: Flora Lipps MD  History of present illness: 72 y.o. male PMHx multiple myeloma, A-fib, T1DM with multiple DKA admissions, CKD stage III, and history of cocaine abuse presenting to the ED with chief complaint of hyperglycemia.  Upon evaluation he was found to have ulcers to the left great toe.  X-rays were ordered and podiatry was consulted. According the patient he has not had these wounds managed or treated in the past.  He believes they are only a few weeks old.  Denies a history of injury.  He has not done anything for treatment.  He says that they are minimally symptomatic  Past Medical History:  Diagnosis Date   DKA (diabetic ketoacidoses) 04/06/2016   Hypercholesteremia    Hypertension        Latest Ref Rng & Units 04/21/2022    4:50 AM 04/20/2022   11:13 PM 04/17/2022    9:43 AM  CBC  WBC 4.0 - 10.5 K/uL 5.6  5.9  3.2   Hemoglobin 13.0 - 17.0 g/dL 8.9  10.5  11.0   Hematocrit 39.0 - 52.0 % 29.7  38.0  35.1   Platelets 150 - 400 K/uL 282  303  409        Latest Ref Rng & Units 04/21/2022   11:37 AM 04/21/2022    5:30 AM 04/21/2022    4:44 AM  BMP  Glucose 70 - 99 mg/dL 175  580  703   BUN 8 - 23 mg/dL 81  85  87   Creatinine 0.61 - 1.24 mg/dL 2.96  3.35  3.52   Sodium 135 - 145 mmol/L 148  142  141   Potassium 3.5 - 5.1 mmol/L 3.8  4.4  4.9   Chloride 98 - 111 mmol/L 117  112  110   CO2 22 - 32 mmol/L '23  16  14   '$ Calcium 8.9 - 10.3 mg/dL 8.4  7.8  7.8     LT ankle ulcer 04/21/2022  LT foot 04/21/2022   Physical Exam: General: The patient is alert and oriented x3 in no acute distress.   Dermatology: Multiple ulcers noted left foot.  The main ulcer of concern encompasses the distal tuft of the left great toe with mixed fibrotic and nonviable  debris/tissue.  Mild malodor. Ulcer also noted to the medial aspect of the distal tip of the left second toe.  This ulcer appears more stable with a granular base. Finally, ulcer noted to the medial aspect of the left ankle.  This ulcer appears stable as well with a healthy granular wound base after removal of the superficial overlying eschar  Vascular: Moderate edema noted throughout the foot ankle and leg of the left lower extremity compared to the contralateral limb with moderate erythema.  The right lower extremity is cool to touch while the left lower extremity is warm.  US ARTERIAL ABI 03/11/2022 FINDINGS: Right ABI:  1.16 Left ABI:  1.24 Right Lower Extremity:  Normal arterial waveforms at the ankle. Left Lower Extremity:  Normal arterial waveforms at the ankle. IMPRESSION: Normal ankle-brachial indices bilaterally.1.0-1.4 Normal  Neurological: Diminished via light touch  Musculoskeletal Exam: No structural deformity noted.  No prior amputations  XR FOOT LT COMPLETE today, 04/21/2022 IMPRESSION: 1. Soft tissue irregularity overlying the great toe distal phalanx  with destruction of the underlying bone consistent with osteomyelitis. 2. Cortical irregularity of the medial aspect of the base of the great toe proximal phalanx could reflect fracture or additional site of infection.  MR FOOT LEFT WO CONTRAST today, 04/21/2022 IMPRESSION: Osteomyelitis of the great toe distal phalanx with adjacent ulcer and lobular confluent fluid at the tip of the great toe suspicious for small abscess measuring 1.2 x 1.0 x 1.3 cm.   Early osteomyelitis of the second toe distal phalanx with adjacent ulcer and soft tissue swelling.   Moderate first MTP osteoarthritis with periarticular marrow edema and possible small fracture at the lateral proximal phalangeal base.   ASSESSMENT/PLAN OF CARE Osteomyelitis with diabetic toe ulcer left  -Patient seen at bedside in the ED.  MRI was reviewed this evening.   Recommend left great toe and partial second toe amputation.  This was not discussed with the patient since MRI was not completed at the time of evaluation.  Will plan to discuss with the patient tomorrow -Consider vascular consult if there is not adequate perfusion and bleeding intraoperatively -Continue Augmentin 500/125 mg 2 times daily as ordered -Continue wound dressings daily as ordered. -Will tentatively plan for amputation surgery tomorrow, 04/22/2022, or Thursday, 04/23/2022. So long as patient is amenable to this plan     Thank you for the consult.  Please contact me directly via secure chat with any questions or concerns.     Edrick Kins, DPM [facility]  Dr. Edrick Kins, DPM    [address],  [address]                Office [phone]  Fax [phone]

## 2022-04-21 NOTE — Progress Notes (Signed)
Pharmacy Electrolyte Monitoring Consult:  Pharmacy consulted to assist in monitoring and replacing electrolytes in this 72 y.o. male admitted on 04/21/2022 with Hyperglycemia   Labs:  Sodium (mmol/L)  Date Value  04/21/2022 142  10/03/2020 135  10/11/2012 137   Potassium (mmol/L)  Date Value  04/21/2022 4.4  10/11/2012 4.2   Magnesium (mg/dL)  Date Value  04/21/2022 2.4   Phosphorus (mg/dL)  Date Value  04/21/2022 7.4 (H)   Calcium (mg/dL)  Date Value  04/21/2022 7.8 (L)   Calcium, Total (mg/dL)  Date Value  10/11/2012 8.7   Albumin (g/dL)  Date Value  04/17/2022 3.7  10/03/2020 3.3 (L)   Hx:  multiple myeloma, atrial fibrillation, T1DM, with recuirrent DKA admissions,  AV block first-degree, CKD stage III, CVA, chronic anemia with abnormal SPEP, HTN, HLD and cocaine abuse  -admitted w/ DKA, +cocaine -insulin drip  Assessment/Plan: 1/23 0530:  K 4.4   Scr 3.35 -on insulin drip - no electrolyte replacement at current time -f/u BMP q4h then renal fxn panel daily  Waylon Koffler A 04/21/2022 8:34 AM

## 2022-04-21 NOTE — H&P (Addendum)
NAME:  Nicholas Weiss, MRN:  [id number], DOB:  Jul 29, 1950, LOS: 0 ADMISSION DATE:  04/21/2022, CONSULTATION DATE: 04/21/2022 REFERRING MD: Arta Silence, CHIEF COMPLAINT: Hyperglycemia   HPI  72 y.o with significant PMH of multiple myeloma, atrial fibrillation, T1DM, with recuirrent DKA admissions,  AV block first-degree, CKD stage III, CVA, chronic anemia with abnormal SPEP, HTN, HLD and cocaine abuse who presented to the ED with chief complaints of hyperglycemia.   Patient report he has been feeling unwell x 1 week and was unable to tolerate po intake or take his medications. He initially reported hypoglycemia however today he complained of nausea and vomiting . He was recently discharged on 0/11/2022 following admission for DKA and COVID treated with Paxlovid.   ED Course: Initial vital signs showed HR of 110 beats/minute, BP 105/50 mm Hg, the RR 16 breaths/minute, and the oxygen saturation 100 % on RA and a temperature of 97.61F (36.5C). Pertinent Labs/Diagnostics Findings: Chemistry:Na+/ K+: 137/7.5 Glucose 897: BUN/Cr.  89/3.88, CO2 8, Anion gap 30 CBC: WBC: 5.9 Hgb/Hct: 10.5/38.0  Other Lab findings: Beta hydroxybutyrate>8.0, Lactic acid: 4.8 COVID PCR: positive on 04/07/22 Venous Blood Gas result:  pO2 51; pCO2 34; pH 7.02;  HCO3 8.8, %O2 Sat 58.1.   The laboratory data showed consistent with patient's well-known recurrent DKA. Urinalysis and chest xray demonstrated no evidence of infection.  Given ECG changes and severe AKI, the management was initiated with initial intravenous fluid, hyperkalemia correction with IV calcium gluconate, and intravenous (IV) bicarbonate push followed by intravenous insulin bolus and infusion as per DKA protocol. Serum electrolytes were closely monitored. PCCM consulted to admit for further management of DKA.   Past Medical History    Stroke (cerebrum) ([place]) 11/07/2021   Multiple myeloma ([place]) 09/29/2021   Multiple myeloma not having achieved  remission ([place]) 09/29/2021   Chronic hyponatremia 08/18/2021   COVID-19 virus detected 08/13/2021   History of CVA (cerebrovascular accident) 08/13/2021   Syncope and collapse 08/09/2021   Generalized abdominal pain 08/05/2021   Hepatic lesion 08/05/2021   Dyslipidemia 05/08/2021   History of substance abuse ([place]) 00/17/4944   Acute metabolic encephalopathy 96/75/9163   Cocaine abuse ([place]) 11/30/2020   Anemia of chronic disease 11/08/2020   Muscle twitching 09/12/2020   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   Pre-syncope 08/01/2020   Heart block AV first degree 08/01/2020   Anemia of chronic kidney failure, stage 3 (moderate) ([place]) 05/28/2020   PUD (peptic ulcer disease)     Esophageal dysphagia     Encounter for screening colonoscopy     Generalized weakness 07/28/2019   Hypoglycemia 04/27/2019   Unresponsiveness 04/27/2019   Lung nodule 04/07/2019   Uncontrolled type 1 diabetes mellitus     Acute renal failure with acute tubular necrosis superimposed on stage 3a chronic kidney disease ([place])     Hypertensive urgency 04/05/2019   AKI (acute kidney injury) ([place]) 04/05/2019   CKD (chronic kidney disease) stage 3, GFR 30-59 ml/min ([place]) 04/05/2019   DKA (diabetic ketoacidoses) 04/06/2016   Hypercholesterolemia 01/24/2016   Essential (primary) hypertension 06/07/2006   Type 1 diabetes mellitus with diabetic neuropathy, unspecified ([place])      Significant Hospital Events   1/23.  Admitted with severe DKA in the setting of cocaine use and medication noncompliance  Consults:  Diabetes coordinator  Procedures:  None  Significant Diagnostic Tests:  1/23: Chest Xray>  Micro Data:  1/23: SARS-CoV-2 PCR> positive on 04/07/2022 1/23: Influenza PCR> negative 1/23: Blood culture x2> 1/23: MRSA PCR>>  Antimicrobials:  None  OBJECTIVE  Blood pressure 130/65, pulse 90, temperature 97.8 F (36.6 C), temperature source Oral, resp. rate 12, height '5\' 10"'$  (1.778 m), weight 59.4 kg,  SpO2 100 %.       No intake or output data in the 24 hours ending 04/21/22 0451 Filed Weights   04/20/22 2255  Weight: 59.4 kg     Physical Examination  GENERAL:72  year-old male critically ill patient lying in the bed with no acute distress.  EYES: Pupils equal, round, reactive to light and accommodation. No scleral icterus. Extraocular muscles intact.  HEENT: Head atraumatic, normocephalic. Oropharynx and nasopharynx clear.  NECK:  Supple, no jugular venous distention. No thyroid enlargement, no tenderness.  LUNGS: Normal breath sounds bilaterally, no wheezing, rales,rhonchi or crepitation. No use of accessory muscles of respiration.  CARDIOVASCULAR: S1, S2 normal. No murmurs, rubs, or gallops.  ABDOMEN: Soft, nontender, nondistended. Bowel sounds present. No organomegaly or mass.  EXTREMITIES: No pedal edema, cyanosis, or clubbing.  NEUROLOGIC: Cranial nerves II through XII are intact.  Muscle strength 5/5 in all extremities. Sensation intact. Gait not checked.  PSYCHIATRIC: The patient is alert and oriented x 1.  SKIN: No obvious rash, lesion, or ulcer.   Labs/imaging that I havepersonally reviewed  (right click and "Reselect all SmartList Selections" daily)     Labs   CBC: Recent Labs  Lab 04/17/22 0943 04/20/22 2313  WBC 3.2* 5.9  NEUTROABS 1.4*  --   HGB 11.0* 10.5*  HCT 35.1* 38.0*  MCV 93.9 105.8*  PLT 409* 382    Basic Metabolic Panel: Recent Labs  Lab 04/17/22 0943 04/20/22 2313  NA 142 137  K 3.8 >7.5*  CL 105 99  CO2 27 8*  GLUCOSE 131* 897*  BUN 63* 89*  CREATININE 2.92* 3.88*  CALCIUM 9.4 8.5*   GFR: Estimated Creatinine Clearance: 15.1 mL/min (A) (by C-G formula based on SCr of 3.88 mg/dL (H)). Recent Labs  Lab 04/17/22 0943 04/20/22 2313 04/21/22 0057  WBC 3.2* 5.9  --   LATICACIDVEN  --   --  4.8*    Liver Function Tests: Recent Labs  Lab 04/17/22 0943  AST 14*  ALT 16  ALKPHOS 98  BILITOT 0.6  PROT 9.1*  ALBUMIN 3.7   No  results for input(s): "LIPASE", "AMYLASE" in the last 168 hours. No results for input(s): "AMMONIA" in the last 168 hours.  ABG    Component Value Date/Time   PHART 7.43 11/29/2020 1221   PCO2ART 46 11/29/2020 1221   PO2ART 85 11/29/2020 1221   HCO3 8.8 (L) 04/21/2022 0100   TCO2 23 04/02/2018 0003   ACIDBASEDEF 21.3 (H) 04/21/2022 0100   O2SAT 58.1 04/21/2022 0100     Coagulation Profile: No results for input(s): "INR", "PROTIME" in the last 168 hours.  Cardiac Enzymes: No results for input(s): "CKTOTAL", "CKMB", "CKMBINDEX", "TROPONINI" in the last 168 hours.  HbA1C: Hgb A1c MFr Bld  Date/Time Value Ref Range Status  03/09/2022 09:55 PM 9.1 (H) 4.8 - 5.6 % Final    Comment:    (NOTE)         Prediabetes: 5.7 - 6.4         Diabetes: >6.4         Glycemic control for adults with diabetes: <7.0   11/27/2021 09:59 AM 8.9 (H) 4.8 - 5.6 % Final    Comment:    (NOTE) Pre diabetes:          5.7%-6.4%  Diabetes:              >  6.4%  Glycemic control for   <7.0% adults with diabetes     CBG: Recent Labs  Lab 04/21/22 0228 04/21/22 0257 04/21/22 0329 04/21/22 0359 04/21/22 0434  GLUCAP >600* >600* 555* 568* 531*    Review of Systems:   PATIENT UNABLE TO PROVIDE HISTORY DUE TO LETHARGY  Past Medical History  He,  has a past medical history of DKA (diabetic ketoacidoses) (04/06/2016), Hypercholesteremia, and Hypertension.   Surgical History    Past Surgical History:  Procedure Laterality Date   COLONOSCOPY WITH PROPOFOL N/A 02/01/2020   Procedure: COLONOSCOPY WITH PROPOFOL;  Surgeon: Lin Landsman, MD;  Location: [facility] ENDOSCOPY;  Service: Gastroenterology;  Laterality: N/A;   ESOPHAGOGASTRODUODENOSCOPY  02/01/2020   Procedure: ESOPHAGOGASTRODUODENOSCOPY (EGD);  Surgeon: Lin Landsman, MD;  Location: [facility] ENDOSCOPY;  Service: Gastroenterology;;   KNEE SURGERY Right    Torn meniscus   KNEE SURGERY Left      Social History   reports that he has never  smoked. He has never used smokeless tobacco. He reports that he does not currently use alcohol. He reports that he does not currently use drugs after having used the following drugs: Marijuana and "Crack" cocaine.   Family History   His family history includes Cancer in his sister; Heart attack in his father; Hypertension in his sister.   Allergies Allergies  Allergen Reactions   Penicillins Other (See Comments)    Childhood allergy Unknown reaction Has patient had a PCN reaction causing immediate rash, facial/tongue/throat swelling, SOB or lightheadedness with hypotension: No Has patient had a PCN reaction causing severe rash involving mucus membranes or skin necrosis: No Has patient had a PCN reaction that required hospitalization No Has patient had a PCN reaction occurring within the last 10 years: No If all of the above answers are "NO", then may proceed with Cephalosporin use.      Home Medications  Prior to Admission medications   Medication Sig Start Date End Date Taking? Authorizing Provider  acetaminophen (TYLENOL) 500 MG tablet Take 1,000 mg by mouth daily as needed for mild pain or moderate pain.    [provider]  ascorbic acid (VITAMIN C) 500 MG tablet Take 1 tablet (500 mg total) by mouth daily. 04/09/22   Lorella Nimrod, MD  aspirin EC 81 MG tablet Take 81 mg by mouth in the morning.    [provider]  atorvastatin (LIPITOR) 80 MG tablet Take 1 tablet (80 mg total) by mouth at bedtime. Hold while taking Paxlovid 04/08/22   Lorella Nimrod, MD  Baclofen 5 MG TABS Take 5 mg by mouth daily as needed (muscle spasm).    [provider]  clopidogrel (PLAVIX) 75 MG tablet Take 75 mg by mouth in the morning. 12/03/21   [provider]  docusate sodium (COLACE) 100 MG capsule Take 100 mg by mouth daily as needed for mild constipation.    [provider]  FARXIGA 10 MG TABS tablet Take 10 mg by mouth in the morning. 05/05/21   [provider]  ferrous sulfate 325 (65 FE) MG tablet Take 1 tablet (325 mg total) by mouth daily with breakfast. 04/09/22   Lorella Nimrod, MD  gabapentin (NEURONTIN) 300 MG capsule Take 300-600 mg by mouth See admin instructions. 300 mg in the morning, 600 mg at night. 04/04/21   [provider]  guaiFENesin-dextromethorphan (ROBITUSSIN DM) 100-10 MG/5ML syrup Take 10 mLs by mouth every 4 (four) hours as needed for cough. 04/08/22   Lorella Nimrod,  MD  insulin degludec (TRESIBA FLEXTOUCH) 100 UNIT/ML FlexTouch Pen Inject 15 Units into the skin daily. 03/12/22   Wyvonnia Dusky, MD  Ipratropium-Albuterol (COMBIVENT) 20-100 MCG/ACT AERS respimat Inhale 1 puff into the lungs every 6 (six) hours. 04/08/22   Lorella Nimrod, MD  lenalidomide (REVLIMID) 10 MG capsule Take 1 capsule (10 mg total) by mouth daily. Take 10 mg daily for 14 days and then off 7 days ( start the meds on 04/03/2022) obtained1/03/2022, REMS: [id number] 03/30/22   Sindy Guadeloupe, MD  losartan (COZAAR) 25 MG tablet Take 1 tablet (25 mg total) by mouth daily. 04/09/22   Lorella Nimrod, MD  Multiple Vitamin (MULTIVITAMIN WITH MINERALS) TABS tablet Take 1 tablet by mouth daily. 04/09/22   Lorella Nimrod, MD  NOVOLOG 100 UNIT/ML injection Inject 0-9 Units into the skin 3 (three) times daily with meals. 03/12/22   Wyvonnia Dusky, MD  ondansetron (ZOFRAN) 8 MG tablet Take 8 mg by mouth daily as needed for nausea or vomiting.    [provider]  prochlorperazine (COMPAZINE) 10 MG tablet Take 10 mg by mouth daily as needed for nausea or vomiting.    [provider]  zinc sulfate 220 (50 Zn) MG capsule Take 1 capsule (220 mg total) by mouth daily. 04/09/22   Lorella Nimrod, MD  Scheduled Meds:  heparin  5,000 Units Subcutaneous Q8H   Continuous Infusions:  sodium chloride 125 mL/hr at 04/21/22 0231   dextrose 5% lactated ringers Stopped (04/21/22 0120)   insulin 7 Units/hr (04/21/22 0401)   PRN Meds:.dextrose, docusate  sodium, polyethylene glycol   Active Hospital Problem list     Assessment & Plan:  # Diabetic Ketoacidosis likely in the setting of medication noncompliance  and Cocaine Intoxication  Background history of uncontrolled type 1 diabetes with recurrent  admissions for DKA  likely due to noncompliance and polysubstance abuse.  -CXR, UA negative, Blood  and urine Cultures for infection pending -prior UDS +Cocaine + Marijuana, UDS pending -Troponin  slightly elevated, EKG peaked T waves with no evidence of ischemia -Received Insulin (regular) 0.1u/kg (~10 units) IV x1. Continue Insulin drip, per DKA protocol -Keep NPO -Glucose q1h to titrate insulin -4h BMP+mag, (ABG/VBG)  -Aggressive  volume repletion  -Goal to normalize anion gap  -Diabetes coordinator consult   #Suspected Sepsis of unknown source Recent COVID-19 infection (asymptomatic) -Monitor fever curve -Trend WBC's & Procalcitonin -Follow cultures as above -Hold empiric antibiotics for now  pending cultures & sensitivities   #AKI on CKD stage III #Hyperkalemia corrected separately #Severe Anion Gap Metabolic Acidosis with Lactic Acidosis -Monitor I&O's / urinary output -Follow BMP -Ensure adequate renal perfusion -Avoid nephrotoxic agents as able -Replace electrolytes as indicated   #HTN #HLD #Hx Recent CVA -Continue Aspirin and Plavix -Continue atorvastatin 80 mg -Hold lisinopril in setting of AKI -Continue amlodipine   #Polysubstance Abuse Prior UDS+ Cocaine & Marijuana, UDS pending -High risk for withdrawal -Counseling provided   #Multiple myeloma (HCC) -Currently on Revlimid -Follows with oncology    Best practice:  Diet:  NPO Pain/Anxiety/Delirium protocol (if indicated): No VAP protocol (if indicated): Not indicated DVT prophylaxis: Subcutaneous Heparin GI prophylaxis: H2B Glucose control:  Insulin gtt Central venous access:  N/A Arterial line:  N/A Foley:  N/A Mobility:  bed rest  PT  consulted: N/A Last date of multidisciplinary goals of care discussion [1/23] Code Status:  full code Disposition: ICU   = Goals of Care = Code Status Order: FULL  Primary Emergency Contact: Bruce,[name],  Home Phone: [phone] Wishes to pursue full aggressive treatment and intervention options, including CPR and intubation, but goals of care will be addressed on going with patient and  family if that should become necessary.  Critical care time: 38 minutes       Rufina Falco DNP, CCRN, FNP-C, AGACNP-BC Acute Care NP [facility] PCCM on call pager [phone]

## 2022-04-21 NOTE — ED Notes (Signed)
Dr. Cherylann Banas asked about VBG needing to be collected.  He was advised that it had already been collected.  Respiratory was called.  They said it had been run, but had not crossed over to [facility].  They were working to get the results crossed over.  Dr. Cherylann Banas has been apprised.

## 2022-04-21 NOTE — Consult Note (Signed)
WOC Nurse Consult Note: Reason for Consult: toe wounds Patient in for weakness and DKA.  Wound type: Neuropathic; patient with normal ABIs at the last admission in Dec. 2023 Pressure Injury POA: NA Measurement:less than 1cm on the left great toe tip and less than 1cm on the medial 2nd toe, in the webspace Wound DHW:YSHU thickness with some dry eschar on the great toe Noted some discoloration on the right 2nd toe tip, healing area on the right lateral foot  Drainage (amount, consistency, odor) not able to assess in images  Periwound: hyperkeratotic on the left toes  Dressing procedure/placement/frequency: Single layer of xeroform on the left great toe and 2nd toe, dry dressing. Change daily.   In the presence of DM and recurrent DKA, would strongly recommend follow up with podiatry.    Re consult if needed, will not follow at this time. Thanks  Chrissie Dacquisto [employer], RN,CWOCN, CNS, [place] [phone])

## 2022-04-21 NOTE — H&P (View-Only) (Signed)
PODIATRY CONSULTATION  NAME Nicholas Weiss MRN [id number] DOB 1950/09/19 DOA 04/21/2022   Reason for consult: Ulcer LT great toe Chief Complaint  Patient presents with   Hyperglycemia    Consulting physician: Flora Lipps MD  History of present illness: 72 y.o. male PMHx multiple myeloma, A-fib, T1DM with multiple DKA admissions, CKD stage III, and history of cocaine abuse presenting to the ED with chief complaint of hyperglycemia.  Upon evaluation he was found to have ulcers to the left great toe.  X-rays were ordered and podiatry was consulted. According the patient he has not had these wounds managed or treated in the past.  He believes they are only a few weeks old.  Denies a history of injury.  He has not done anything for treatment.  He says that they are minimally symptomatic  Past Medical History:  Diagnosis Date   DKA (diabetic ketoacidoses) 04/06/2016   Hypercholesteremia    Hypertension        Latest Ref Rng & Units 04/21/2022    4:50 AM 04/20/2022   11:13 PM 04/17/2022    9:43 AM  CBC  WBC 4.0 - 10.5 K/uL 5.6  5.9  3.2   Hemoglobin 13.0 - 17.0 g/dL 8.9  10.5  11.0   Hematocrit 39.0 - 52.0 % 29.7  38.0  35.1   Platelets 150 - 400 K/uL 282  303  409        Latest Ref Rng & Units 04/21/2022   11:37 AM 04/21/2022    5:30 AM 04/21/2022    4:44 AM  BMP  Glucose 70 - 99 mg/dL 175  580  703   BUN 8 - 23 mg/dL 81  85  87   Creatinine 0.61 - 1.24 mg/dL 2.96  3.35  3.52   Sodium 135 - 145 mmol/L 148  142  141   Potassium 3.5 - 5.1 mmol/L 3.8  4.4  4.9   Chloride 98 - 111 mmol/L 117  112  110   CO2 22 - 32 mmol/L '23  16  14   '$ Calcium 8.9 - 10.3 mg/dL 8.4  7.8  7.8     LT ankle ulcer 04/21/2022  LT foot 04/21/2022   Physical Exam: General: The patient is alert and oriented x3 in no acute distress.   Dermatology: Multiple ulcers noted left foot.  The main ulcer of concern encompasses the distal tuft of the left great toe with mixed fibrotic and nonviable  debris/tissue.  Mild malodor. Ulcer also noted to the medial aspect of the distal tip of the left second toe.  This ulcer appears more stable with a granular base. Finally, ulcer noted to the medial aspect of the left ankle.  This ulcer appears stable as well with a healthy granular wound base after removal of the superficial overlying eschar  Vascular: Moderate edema noted throughout the foot ankle and leg of the left lower extremity compared to the contralateral limb with moderate erythema.  The right lower extremity is cool to touch while the left lower extremity is warm.  US ARTERIAL ABI 03/11/2022 FINDINGS: Right ABI:  1.16 Left ABI:  1.24 Right Lower Extremity:  Normal arterial waveforms at the ankle. Left Lower Extremity:  Normal arterial waveforms at the ankle. IMPRESSION: Normal ankle-brachial indices bilaterally.1.0-1.4 Normal  Neurological: Diminished via light touch  Musculoskeletal Exam: No structural deformity noted.  No prior amputations  XR FOOT LT COMPLETE today, 04/21/2022 IMPRESSION: 1. Soft tissue irregularity overlying the great toe distal phalanx  with destruction of the underlying bone consistent with osteomyelitis. 2. Cortical irregularity of the medial aspect of the base of the great toe proximal phalanx could reflect fracture or additional site of infection.  MR FOOT LEFT WO CONTRAST today, 04/21/2022 IMPRESSION: Osteomyelitis of the great toe distal phalanx with adjacent ulcer and lobular confluent fluid at the tip of the great toe suspicious for small abscess measuring 1.2 x 1.0 x 1.3 cm.   Early osteomyelitis of the second toe distal phalanx with adjacent ulcer and soft tissue swelling.   Moderate first MTP osteoarthritis with periarticular marrow edema and possible small fracture at the lateral proximal phalangeal base.   ASSESSMENT/PLAN OF CARE Osteomyelitis with diabetic toe ulcer left  -Patient seen at bedside in the ED.  MRI was reviewed this evening.   Recommend left great toe and partial second toe amputation.  This was not discussed with the patient since MRI was not completed at the time of evaluation.  Will plan to discuss with the patient tomorrow -Consider vascular consult if there is not adequate perfusion and bleeding intraoperatively -Continue Augmentin 500/125 mg 2 times daily as ordered -Continue wound dressings daily as ordered. -Will tentatively plan for amputation surgery tomorrow, 04/22/2022, or Thursday, 04/23/2022. So long as patient is amenable to this plan     Thank you for the consult.  Please contact me directly via secure chat with any questions or concerns.     Edrick Kins, DPM [facility]  Dr. Edrick Kins, DPM    [address], [place] [address]                Office [phone]  Fax [phone]

## 2022-04-21 NOTE — ED Notes (Signed)
Pt transported to MRI, unhooked from insulin and fluids.

## 2022-04-21 NOTE — Care Plan (Addendum)
   BRIEF PCCM NOTE  Pt was seen earlier by PCCM provider Rufina Falco, NP.  Please see their H&P with attestation by Dr. Mortimer Fries for full assessment & plan.  BRIEF PT DESCRIPTION / SYNOPSIS :  72 y.o. Male with PMHx significant for Polysubstance abuse (+ cocaine and marijuana on previous admits), unconcontrolled  Type I Diabetes Mellitus with multiple admissions for DKA, who is  admitted with severe DKA, suspect in setting of medication noncompliance and Cocaine, along with diabetic toe wound.   SUBJECTIVE / INTERVAL HISTORY :  -Afebrile, hemodynamically stable, no vasopressors -Suspect etiology of DKA is med noncompliance and suspected cocaine, and diabetic foot wounds -UDS  is pending -CXR without evidence of pneumonia, UA is pending -Follow BMP now shows resolution of DKA ~ will transition off insulin gtt to Levemir + SSI, will continue with 1/2 NS given AKI and mild Hypernatremia -Upon exam noted to have diabetic wounds to his left great toe and 2nd toe (he reports he hit his foot about 1 week ago, denies pain or foul smelling draining) -Will obtain X-ray foot, culture wound, consult Podiatry and wound care,  will start empiric Augmentin in the interim       OBJECTIVE :   Today's Vitals   04/21/22 0530 04/21/22 0600 04/21/22 0715 04/21/22 0803  BP: 131/80 (!) 144/78  (!) 127/105  Pulse: 88 84  81  Resp: '15 19  17  '$ Temp:    (!) 97.5 F (36.4 C)  TempSrc:    Oral  SpO2: 100% 100%  100%  Weight:      Height:      PainSc:   0-No pain    Body mass index is 18.78 kg/m.   ASSESSMENT / PLAN :   #Severe DKA, suspect in setting of medication noncompliance and suspected Cocaine intoxication, along with diabetic foot wounds ~ RESOLVED -Follow DKA protocol -Aggressive IV fluids -Insulin gtt -Follow BMP q4h & beta-hydroxybutyric acid q8h -Once AG closed and serum Bicarb >20, can convert to SSI + basal insulin -Consult Diabetes Coordinator, appreciate input  #Suspected Sepsis,  likely due to diabetic wounds to Left great & 2nd toes -Monitor fever curve -Trend WBC's & Procalcitonin -Follow cultures as above -UA is pending, Chest X-ray without evidence of pna -Start empiric Augmentin pending cultures & sensitivities -Culture wound ~ X-ray of foot is pending -Podiatry & wound care consulted, appreciate input -May need Vascular Surgery consult for consideration of angiogram to evaluate blood flow  #AKI on CKD Stage III ~ IMPROVING #Severe AG Metabolic Acidosis in setting of DKA and lactic acidosis ~ RESOLVED -Monitor I&O's / urinary output -Follow BMP -Ensure adequate renal perfusion -Avoid nephrotoxic agents as able -Replace electrolytes as indicated ~ Pharmacy following -IV fluids -Trend lactic  #Acute Metabolic Encephalopathy in setting of DKA ~ IMPROVING PMHx: Polysubstance abuse -Treatment of DKA and metabolic derangements as outlined above -Provide supportive care -Promote normal sleep/wake cycle -Avoid sedating meds as able -UDS is currently pending -High risk for withdrawal -Encourage substance cessation        Additional Critical Care Time: 30 minutes  Darel Hong, AGACNP-BC [facility] Prefer epic messenger for cross cover needs If after hours, please call E-link

## 2022-04-21 NOTE — Inpatient Diabetes Management (Signed)
Inpatient Diabetes Program Recommendations  AACE/ADA: New Consensus Statement on Inpatient Glycemic Control (2015)  Target Ranges:  Prepandial:   less than 140 mg/dL      Peak postprandial:   less than 180 mg/dL (1-2 hours)      Critically ill patients:  140 - 180 mg/dL   Lab Results  Component Value Date   GLUCAP 289 (H) 04/21/2022   HGBA1C 9.1 (H) 03/09/2022    Review of Glycemic Control  Diabetes history: DM 1 Outpatient Diabetes medications:  Tresiba 15 units daily IF EATING (instructions for patient from Dr. Gabriel Carina) Take 3 unit of NovoLog if your sugar is 80 - 120 Take 5 unit of NovoLog if your sugar is 121-170 Take 7 units of NovoLog if your sugar is 171-220 Take 9 units of NovoLog if your sugar is 221-270 Take 10 units of NovoLog if your sugar is 271 or higher  IF NOT EATING (AND YOU HAVE NOT TAKEN NOVOLOG IN THE LAST 3 HOURS) Take 2 units of NovoLog if your sugar is 201-250 Take 4 units of NovoLog if your sugar is 251-300 Take 6 units of NovoLog if your sugar is 301 or higher Current orders for Inpatient glycemic control: IV insulin  Inpatient Diabetes Program Recommendations:   RN clarified with patient that he has not been taking farxiga @ home. Recently discharged from hospital on 04/08/22.  Sees Dr. Gabriel Carina for endocrinology with last appointment 03/16/22.  Will follow while inpatient.  Thank you, [name]. Hollie Bartus, RN, MSN, CDE  Diabetes Coordinator Inpatient Glycemic Control Team Team Pager [phone] (8am-5pm) 04/21/2022 9:33 AM

## 2022-04-21 NOTE — ED Notes (Signed)
Pt just had an episode of vomiting. Pt's lunch tray is sitting at the bedside. Pt states that he can't eat anything. Told pt that he needs to try to take a couple bites of food at some point. Pt's glucose is 101 with 0.5 mL/hr of insulin running currently. Pt currently resting in bed. MD Kasa notified of episode via secure chat.

## 2022-04-21 NOTE — ED Notes (Signed)
Lunch tray given to patient

## 2022-04-21 NOTE — Progress Notes (Signed)
Pharmacy Electrolyte Monitoring Consult:  Pharmacy consulted to assist in monitoring and replacing electrolytes in this 72 y.o. male admitted on 04/21/2022 with Hyperglycemia   Labs:  Sodium (mmol/L)  Date Value  04/21/2022 148 (H)  10/03/2020 135  10/11/2012 137   Potassium (mmol/L)  Date Value  04/21/2022 3.8  10/11/2012 4.2   Magnesium (mg/dL)  Date Value  04/21/2022 2.4   Phosphorus (mg/dL)  Date Value  04/21/2022 7.4 (H)   Calcium (mg/dL)  Date Value  04/21/2022 8.4 (L)   Calcium, Total (mg/dL)  Date Value  10/11/2012 8.7   Albumin (g/dL)  Date Value  04/17/2022 3.7  10/03/2020 3.3 (L)   Hx:  multiple myeloma, atrial fibrillation, T1DM, with recuirrent DKA admissions,  AV block first-degree, CKD stage III, CVA, chronic anemia with abnormal SPEP, HTN, HLD and cocaine abuse  -admitted w/ DKA, +cocaine -insulin drip  Assessment/Plan: 1/23 1137: K 3.8, Scr 2.96 -on insulin drip - no electrolyte replacement at current time - f/u BMP q4h then renal fxn panel daily  Lorin Picket, PharmD 04/21/2022 12:43 PM

## 2022-04-21 NOTE — ED Notes (Addendum)
CBG checked and was found to be 23.  Pt given 25 g of D50 and Domingo Pulse, NP was messaged with finding.  Pt has noq agreed to eat at this time and will be given meal tray.

## 2022-04-21 NOTE — ED Notes (Signed)
Patient transported to Ultrasound 

## 2022-04-21 NOTE — ED Notes (Signed)
Lab called and said Lactic Acid is 4.8.  Dr. Cherylann Banas has been notified.  No new orders at this time.

## 2022-04-21 NOTE — ED Notes (Signed)
Glucose 431, Insulin infusion remains at 10.5 mL/hr

## 2022-04-21 NOTE — ED Notes (Signed)
Glucose of 423. Insulin infusion remained at 10.5 units/hr

## 2022-04-21 NOTE — ED Notes (Signed)
Pt has what appears to be a possible necrotic spot on his left great toes, and a partial necrotic spot on the second toe. Pt stated that he hit his toe when I asked about his foot, but couldn't give a timeline as to when it occurred. MD Kasa notified via secure chat.

## 2022-04-22 DIAGNOSIS — E1040 Type 1 diabetes mellitus with diabetic neuropathy, unspecified: Secondary | ICD-10-CM | POA: Diagnosis not present

## 2022-04-22 DIAGNOSIS — Z8673 Personal history of transient ischemic attack (TIA), and cerebral infarction without residual deficits: Secondary | ICD-10-CM | POA: Diagnosis not present

## 2022-04-22 DIAGNOSIS — A419 Sepsis, unspecified organism: Secondary | ICD-10-CM | POA: Diagnosis present

## 2022-04-22 DIAGNOSIS — N179 Acute kidney failure, unspecified: Secondary | ICD-10-CM

## 2022-04-22 DIAGNOSIS — N1831 Chronic kidney disease, stage 3a: Secondary | ICD-10-CM

## 2022-04-22 DIAGNOSIS — M869 Osteomyelitis, unspecified: Secondary | ICD-10-CM

## 2022-04-22 DIAGNOSIS — E1010 Type 1 diabetes mellitus with ketoacidosis without coma: Secondary | ICD-10-CM | POA: Diagnosis present

## 2022-04-22 LAB — BLOOD CULTURE ID PANEL (REFLEXED) - BCID2

## 2022-04-22 LAB — GLUCOSE, CAPILLARY
Glucose-Capillary: 111 mg/dL — ABNORMAL HIGH (ref 70–99)
Glucose-Capillary: 114 mg/dL — ABNORMAL HIGH (ref 70–99)
Glucose-Capillary: 127 mg/dL — ABNORMAL HIGH (ref 70–99)
Glucose-Capillary: 262 mg/dL — ABNORMAL HIGH (ref 70–99)

## 2022-04-22 LAB — CBC
HCT: 30 % — ABNORMAL LOW (ref 39.0–52.0)
Hemoglobin: 9.5 g/dL — ABNORMAL LOW (ref 13.0–17.0)
MCH: 29.4 pg (ref 26.0–34.0)
MCHC: 31.7 g/dL (ref 30.0–36.0)
MCV: 92.9 fL (ref 80.0–100.0)
Platelets: 249 10*3/uL (ref 150–400)
RBC: 3.23 MIL/uL — ABNORMAL LOW (ref 4.22–5.81)
RDW: 16.1 % — ABNORMAL HIGH (ref 11.5–15.5)
WBC: 5 10*3/uL (ref 4.0–10.5)
nRBC: 0 % (ref 0.0–0.2)

## 2022-04-22 LAB — CBG MONITORING, ED
Glucose-Capillary: 110 mg/dL — ABNORMAL HIGH (ref 70–99)
Glucose-Capillary: 71 mg/dL (ref 70–99)
Glucose-Capillary: 83 mg/dL (ref 70–99)
Glucose-Capillary: 86 mg/dL (ref 70–99)
Glucose-Capillary: 89 mg/dL (ref 70–99)

## 2022-04-22 LAB — RENAL FUNCTION PANEL
Albumin: 3 g/dL — ABNORMAL LOW (ref 3.5–5.0)
Anion gap: 6 (ref 5–15)
BUN: 75 mg/dL — ABNORMAL HIGH (ref 8–23)
CO2: 24 mmol/L (ref 22–32)
Calcium: 8.6 mg/dL — ABNORMAL LOW (ref 8.9–10.3)
Chloride: 115 mmol/L — ABNORMAL HIGH (ref 98–111)
Creatinine, Ser: 2.44 mg/dL — ABNORMAL HIGH (ref 0.61–1.24)
GFR, Estimated: 28 mL/min — ABNORMAL LOW (ref >60)
Glucose, Bld: 57 mg/dL — ABNORMAL LOW (ref 70–99)
Phosphorus: 3.4 mg/dL (ref 2.5–4.6)
Potassium: 4 mmol/L (ref 3.5–5.1)
Sodium: 145 mmol/L (ref 135–145)

## 2022-04-22 LAB — PROCALCITONIN: Procalcitonin: 0.76 ng/mL

## 2022-04-22 LAB — MAGNESIUM: Magnesium: 2.2 mg/dL (ref 1.7–2.4)

## 2022-04-22 MED ORDER — INSULIN DETEMIR 100 UNIT/ML ~~LOC~~ SOLN
10.0000 [IU] | Freq: Every day | SUBCUTANEOUS | Status: DC
  Filled 2022-04-22: qty 0.1

## 2022-04-22 MED ORDER — AMLODIPINE BESYLATE 10 MG PO TABS
10.0000 mg | ORAL_TABLET | Freq: Every day | ORAL | Status: DC
  Administered 2022-04-22 – 2022-04-27 (×5): 10 mg via ORAL
  Filled 2022-04-22 (×5): qty 1
  Filled 2022-04-22: qty 2

## 2022-04-22 MED ORDER — HYDROCODONE-ACETAMINOPHEN 5-325 MG PO TABS
1.0000 | ORAL_TABLET | Freq: Four times a day (QID) | ORAL | Status: DC | PRN
  Administered 2022-04-27: 2 via ORAL
  Filled 2022-04-22: qty 2

## 2022-04-22 MED ORDER — HYDRALAZINE HCL 20 MG/ML IJ SOLN
10.0000 mg | INTRAMUSCULAR | Status: DC | PRN
  Administered 2022-04-22: 10 mg via INTRAVENOUS
  Filled 2022-04-22: qty 1

## 2022-04-22 MED ORDER — ACETAMINOPHEN 325 MG PO TABS
650.0000 mg | ORAL_TABLET | Freq: Four times a day (QID) | ORAL | Status: DC | PRN
  Administered 2022-04-22 – 2022-04-29 (×9): 650 mg via ORAL
  Filled 2022-04-22 (×10): qty 2

## 2022-04-22 MED ORDER — LOSARTAN POTASSIUM 50 MG PO TABS
50.0000 mg | ORAL_TABLET | Freq: Every day | ORAL | Status: DC
  Administered 2022-04-22: 50 mg via ORAL
  Filled 2022-04-22: qty 1

## 2022-04-22 MED ORDER — SODIUM CHLORIDE 0.9 % IV SOLN
3.0000 g | Freq: Two times a day (BID) | INTRAVENOUS | Status: DC
  Administered 2022-04-22 – 2022-04-23 (×3): 3 g via INTRAVENOUS
  Filled 2022-04-22 (×3): qty 8

## 2022-04-22 NOTE — Assessment & Plan Note (Deleted)
POA, resolved. Continue Levemir and Novolog Increase Levemir 10 >> 16 units daily Adjust for goal 140-180 Hypoglycemia protocol.

## 2022-04-22 NOTE — Progress Notes (Signed)
Pharmacy Electrolyte Monitoring Consult:  Pharmacy consulted to assist in monitoring and replacing electrolytes in this 72 y.o. male admitted on 04/21/2022 with Hyperglycemia   Labs:  Sodium (mmol/L)  Date Value  04/22/2022 145  10/03/2020 135  10/11/2012 137   Potassium (mmol/L)  Date Value  04/22/2022 4.0  10/11/2012 4.2   Magnesium (mg/dL)  Date Value  04/22/2022 2.2   Phosphorus (mg/dL)  Date Value  04/22/2022 3.4   Calcium (mg/dL)  Date Value  04/22/2022 8.6 (L)   Calcium, Total (mg/dL)  Date Value  10/11/2012 8.7   Albumin (g/dL)  Date Value  04/22/2022 3.0 (L)  10/03/2020 3.3 (L)   Hx:  multiple myeloma, atrial fibrillation, T1DM, with recuirrent DKA admissions,  AV block first-degree, CKD stage III, CVA, chronic anemia with abnormal SPEP, HTN, HLD and cocaine abuse  -admitted w/ DKA, +cocaine -insulin drip  Assessment/Plan: 1/24 0415: K 4.0, Scr 2.44 -on insulin drip stopped 1/23, now on SSI, LAI - no electrolyte replacement at current time - f/u  renal fxn panel daily  Alison Murray, PharmD 04/22/2022 7:25 AM

## 2022-04-22 NOTE — Assessment & Plan Note (Addendum)
Hbg down a bit since surgery, but stable. Monitor CBC. Transfuse if Hbg < 7 Last Hbg 7.7

## 2022-04-22 NOTE — ED Notes (Signed)
Informed RN bed assigned 

## 2022-04-22 NOTE — ED Notes (Addendum)
Pt removed monitoring cords and bp cuff.

## 2022-04-22 NOTE — Progress Notes (Signed)
PHARMACY - PHYSICIAN COMMUNICATION CRITICAL VALUE ALERT - BLOOD CULTURE IDENTIFICATION (BCID)  Results for orders placed or performed during the hospital encounter of 04/21/22  Culture, blood (Routine X 2) w Reflex to ID Panel     Status: None (Preliminary result)   Collection Time: 04/21/22  3:50 AM   Specimen: BLOOD  Result Value Ref Range Status   Specimen Description BLOOD HAND  Final   Special Requests   Final    BOTTLES DRAWN AEROBIC AND ANAEROBIC Blood Culture adequate volume   Culture  Setup Time   Final    Organism ID to follow GRAM POSITIVE COCCI ANAEROBIC BOTTLE ONLY CRITICAL RESULT CALLED TO, READ BACK BY AND VERIFIED WITH: Henson Fraticelli BELEUE '@[id number]'$  ON 04/22/22 SKL    Culture   Final    NO GROWTH < 12 HOURS Performed at [facility], [address] [place], [place] [address]    Report Status PENDING  Incomplete  Blood Culture ID Panel (Reflexed)     Status: Abnormal   Collection Time: 04/21/22  3:50 AM  Result Value Ref Range Status   Enterococcus faecalis NOT DETECTED NOT DETECTED Final   Enterococcus Faecium NOT DETECTED NOT DETECTED Final   Listeria monocytogenes NOT DETECTED NOT DETECTED Final   Staphylococcus species NOT DETECTED NOT DETECTED Final   Staphylococcus aureus (BCID) NOT DETECTED NOT DETECTED Final   Staphylococcus epidermidis NOT DETECTED NOT DETECTED Final   Staphylococcus lugdunensis NOT DETECTED NOT DETECTED Final   Streptococcus species DETECTED (A) NOT DETECTED Final    Comment: Not Enterococcus species, Streptococcus agalactiae, Streptococcus pyogenes, or Streptococcus pneumoniae. CRITICAL RESULT CALLED TO, READ BACK BY AND VERIFIED WITH: Roark Rufo BELEUE '@0206'$  ON 04/22/22 SKL    Streptococcus agalactiae NOT DETECTED NOT DETECTED Final   Streptococcus pneumoniae NOT DETECTED NOT DETECTED Final   Streptococcus pyogenes NOT DETECTED NOT DETECTED Final   A.calcoaceticus-baumannii NOT DETECTED NOT DETECTED Final   Bacteroides fragilis NOT  DETECTED NOT DETECTED Final   Enterobacterales NOT DETECTED NOT DETECTED Final   Enterobacter cloacae complex NOT DETECTED NOT DETECTED Final   Escherichia coli NOT DETECTED NOT DETECTED Final   Klebsiella aerogenes NOT DETECTED NOT DETECTED Final   Klebsiella oxytoca NOT DETECTED NOT DETECTED Final   Klebsiella pneumoniae NOT DETECTED NOT DETECTED Final   Proteus species NOT DETECTED NOT DETECTED Final   Salmonella species NOT DETECTED NOT DETECTED Final   Serratia marcescens NOT DETECTED NOT DETECTED Final   Haemophilus influenzae NOT DETECTED NOT DETECTED Final   Neisseria meningitidis NOT DETECTED NOT DETECTED Final   Pseudomonas aeruginosa NOT DETECTED NOT DETECTED Final   Stenotrophomonas maltophilia NOT DETECTED NOT DETECTED Final   Candida albicans NOT DETECTED NOT DETECTED Final   Candida auris NOT DETECTED NOT DETECTED Final   Candida glabrata NOT DETECTED NOT DETECTED Final   Candida krusei NOT DETECTED NOT DETECTED Final   Candida parapsilosis NOT DETECTED NOT DETECTED Final   Candida tropicalis NOT DETECTED NOT DETECTED Final   Cryptococcus neoformans/gattii NOT DETECTED NOT DETECTED Final    Comment: Performed at [facility], [address] [place], [place] [address]  Culture, blood (Routine X 2) w Reflex to ID Panel     Status: None (Preliminary result)   Collection Time: 04/21/22  3:58 AM   Specimen: BLOOD  Result Value Ref Range Status   Specimen Description BLOOD BLOOD RIGHT ARM  Final   Special Requests   Final    BOTTLES DRAWN AEROBIC AND ANAEROBIC Blood Culture results may not  be optimal due to an excessive volume of blood received in culture bottles   Culture  Setup Time PENDING  Incomplete   Culture   Final    NO ORGANISMS SEEN Performed at [facility], [address] [place],  [address]    Report Status PENDING  Incomplete    BCID Results: 1 (anaerobic) bottle w/ Strep Species, no resistance.  Pt currently on PO  Augmentin  Name of provider contacted: B. Rust-Chester, NP   Changes to prescribed antibiotics required: No changes at this time pending further culture results.  Renda Rolls, PharmD, MBA 04/22/2022 2:22 AM

## 2022-04-22 NOTE — Progress Notes (Signed)
Pharmacy Antibiotic Note  Nicholas Weiss is a 72 y.o. male admitted on 04/21/2022 with  foot infection and streptococcus bacteremia  .  Pharmacy has been consulted for ampicillin/sulbactam dosing. Patient currently receiving amoxicillin/clavulonate - tolerating with listed penicillin allergy which is unknown reaction as child. He has also been prescribed amoxicillin or amoxicillin/clavulanate several times in past.  Patient has left great toe wound and 2nd toe ulcer.  MRI revealed osteomyelitis of great toe and 2nd toe.  Plan for amputation by podiatry 1/25.  Today, 04/22/2022 Renal - SCr elevated above baseline WBC WNL Afebrile 1/23 wound cx pending 1/23 blood cx GPC in one bottle each set, BCID = streptococcus species  Plan: Based on current renal function and CrCl, start ampicillin/sulbactam 3gm IV q12h Follow renal function Await blood and wound culture results  Height: '5\' 10"'$  (177.8 cm) Weight: 59.4 kg (130 lb 13.8 oz) IBW/kg (Calculated) : 73  Temp (24hrs), Avg:98.1 F (36.7 C), Min:97.7 F (36.5 C), Max:98.4 F (36.9 C)  Recent Labs  Lab 04/17/22 0943 04/20/22 2313 04/21/22 0057 04/21/22 0358 04/21/22 0444 04/21/22 0450 04/21/22 0530 04/21/22 1137 04/22/22 0415  WBC 3.2* 5.9  --   --   --  5.6  --   --  5.0  CREATININE 2.92* 3.88*  --   --  3.52*  --  3.35* 2.96* 2.44*  LATICACIDVEN  --   --  4.8* 2.2*  --   --   --   --   --     Estimated Creatinine Clearance: 24 mL/min (A) (by C-G formula based on SCr of 2.44 mg/dL (H)).    Allergies  Allergen Reactions   Penicillins Other (See Comments)    Childhood allergy -tolerated amoxil 03/2022 Unknown reaction Has patient had a PCN reaction causing immediate rash, facial/tongue/throat swelling, SOB or lightheadedness with hypotension: No Has patient had a PCN reaction causing severe rash involving mucus membranes or skin necrosis: No Has patient had a PCN reaction that required hospitalization No Has patient had a PCN  reaction occurring within the last 10 years: No If all of the above answers are "NO", then may proceed with Cephalosporin use.     Antimicrobials this admission: 1/23 amox/clav >> 1/24 1/24 amp/sulb >>  Dose adjustments this admission:   Microbiology results: 1/23 wound cx pending 1/23 blood cx GPC in one bottle each set, BCID = streptococcus species  Thank you for allowing pharmacy to be a part of this patient's care.  Doreene Eland, PharmD, BCPS, BCIDP Work Cell: [phone] 04/22/2022 1:50 PM

## 2022-04-22 NOTE — Progress Notes (Signed)
I spoke with the patient by telephone and discussed the results of his MRI with osteomyelitis of the distal phalanx of the first and second toe.  Discussed treatment of this including antibiotics and surgical intervention with partial amputation.  Discussed with him that hopefully partial amputation of the second toe and may be partial amputation of the first toe, possible full amputation of great toe should hopefully eradicate infection and allow direct wound closure and healing.  We discussed postoperative recovery process including the period of restricted weightbearing for healing of his incisions.  He understands and wishes to proceed.  Surgery is scheduled for tomorrow.  N.p.o. past midnight.  Lanae Crumbly, DPM 04/22/2022

## 2022-04-22 NOTE — Assessment & Plan Note (Addendum)
-  Currently on Revlimid -Follows with oncology -On acyclovir for prophylaxis, needs refills sent at d/c

## 2022-04-22 NOTE — Inpatient Diabetes Management (Signed)
Inpatient Diabetes Program Recommendations  AACE/ADA: New Consensus Statement on Inpatient Glycemic Control (2015)  Target Ranges:  Prepandial:   less than 140 mg/dL      Peak postprandial:   less than 180 mg/dL (1-2 hours)      Critically ill patients:  140 - 180 mg/dL   Lab Results  Component Value Date   GLUCAP 71 04/22/2022   HGBA1C 9.1 (H) 03/09/2022    Latest Reference Range & Units 04/21/22 14:47 04/21/22 16:39 04/21/22 22:25 04/21/22 22:47 04/21/22 23:29 04/22/22 00:17 04/22/22 02:50 04/22/22 06:34 04/22/22 07:38 04/22/22 11:25  Glucose-Capillary 70 - 99 mg/dL 101 (H) 111 (H) 23 (LL) 126 (H) 92 89 83 110 (H) 86 71  (LL): Data is critically low (H): Data is abnormally high  Diabetes history: DM 1 Outpatient Diabetes medications:  Tresiba 15 units daily IF EATING (instructions for patient from Dr. Gabriel Carina) Take 3 unit of NovoLog if your sugar is 80 - 120 Take 5 unit of NovoLog if your sugar is 121-170 Take 7 units of NovoLog if your sugar is 171-220 Take 9 units of NovoLog if your sugar is 221-270 Take 10 units of NovoLog if your sugar is 271 or higher  IF NOT EATING (AND YOU HAVE NOT TAKEN NOVOLOG IN THE LAST 3 HOURS) Take 2 units of NovoLog if your sugar is 201-250 Take 4 units of NovoLog if your sugar is 251-300 Take 6 units of NovoLog if your sugar is 301 or higher Current orders for Inpatient glycemic control: IV insulin  Inpatient Diabetes Program Recommendations:   Patient had hypoglycemia. Please consider: -Decrease Semglee to 10 units qd -Decrease Novolog correction to 0-6 units tid, 0-5 units hs  Thank you, Bethena Roys E. Challen Spainhour, RN, MSN, CDE  Diabetes Coordinator Inpatient Glycemic Control Team Team Pager [phone] (8am-5pm) 04/22/2022 12:14 PM

## 2022-04-22 NOTE — ED Notes (Signed)
PT did not eat breakfast tray, lunch tray at bedside.  This RN encouraging pt to eat, but pt non compliant.

## 2022-04-22 NOTE — Hospital Course (Addendum)
HPI on admission, from ICU H&P: "72 y.o with significant PMH of multiple myeloma, atrial fibrillation, T1DM, with recuirrent DKA admissions,  AV block first-degree, CKD stage III, CVA, chronic anemia with abnormal SPEP, HTN, HLD and cocaine abuse who presented to the ED with chief complaints of hyperglycemia.    Patient report he has been feeling unwell x 1 week and was unable to tolerate po intake or take his medications. He initially reported hypoglycemia however today he complained of nausea and vomiting . He was recently discharged on 0/11/2022 following admission for DKA and COVID treated with Paxlovid.   ED Course: Initial vital signs showed HR of 110 beats/minute, BP 105/50 mm Hg, the RR 16 breaths/minute, and the oxygen saturation 100 % on RA and a temperature of 97.61F (36.5C). Pertinent Labs/Diagnostics Findings: Chemistry:Na+/ K+: 137/7.5 Glucose 897: BUN/Cr.  89/3.88, CO2 8, Anion gap 30 CBC: WBC: 5.9 Hgb/Hct: 10.5/38.0  Other Lab findings: Beta hydroxybutyrate>8.0, Lactic acid: 4.8 COVID PCR: positive on 04/07/22 Venous Blood Gas result:  pO2 51; pCO2 34; pH 7.02;  HCO3 8.8, %O2 Sat 58.1.    The laboratory data showed consistent with patient's well-known recurrent DKA. Urinalysis and chest xray demonstrated no evidence of infection.  Given ECG changes and severe AKI, the management was initiated with initial intravenous fluid, hyperkalemia correction with IV calcium gluconate, and intravenous (IV) bicarbonate push followed by intravenous insulin bolus and infusion as per DKA protocol. Serum electrolytes were closely monitored. PCCM consulted to admit for further management of DKA."  1/24: [facility] assumed care.  Pt off insulin drip.  Seen by podiatry for left foot osteomyelitis.  They plan for amputation this admission. Blood cultures also positive for GPC's, suspect foot is the source.  Changed Augmentin to Unasyn. 1/25: left great toe amputation planned but cancelled by anesthesia due to  cocaine+.  ID consulted.  Continuing Unasyn. 1/26: hypoglemic episodes requiring dextrose fluids. Surgery cancelled again, stlil cocain positive UDS this AM.  New symptomatic orthostatic hypotension afternoon, 500 cc bolus and resume maintenance fluids with D5-NS .Marland Kitchen...  1/31 -- sugars stabilized for the most part since diet resumed.  Pt intermittently refuses insulin and becomes hyperglycemic again, but no recurrence of DKA.  He is improved and stable for discharge, cleared by consultant/s.  To continue Augmentin through 05/23/22.

## 2022-04-22 NOTE — ED Notes (Signed)
Pt given sandwich tray and appx 8oz of apple juice.  Pt states that he will eat the food, but has been having trouble for a while with eating enough.

## 2022-04-22 NOTE — Assessment & Plan Note (Addendum)
Continue statin Held ASA, Plavix for surgery Resume ASA today Likely resume Plavix tomorrow if no further bleeding and Hbg stable/improved.

## 2022-04-22 NOTE — ED Notes (Signed)
Pt BP 201/99, attempted to retake and pt started taking bp cuff off. This RN informed pt to leave it on, pt non compliant.  MD notified.

## 2022-04-22 NOTE — Assessment & Plan Note (Signed)
Resolved

## 2022-04-22 NOTE — Assessment & Plan Note (Signed)
See DKA

## 2022-04-22 NOTE — Progress Notes (Signed)
Progress Note   Patient: Nicholas Weiss VHQ:[id number] DOB: 10/31/1950 DOA: 04/21/2022     2 DOS: the patient was seen and examined on 04/23/2022   Brief hospital course: HPI on admission, from ICU H&P: "72 y.o with significant PMH of multiple myeloma, atrial fibrillation, T1DM, with recuirrent DKA admissions,  AV block first-degree, CKD stage III, CVA, chronic anemia with abnormal SPEP, HTN, HLD and cocaine abuse who presented to the ED with chief complaints of hyperglycemia.    Patient report he has been feeling unwell x 1 week and was unable to tolerate po intake or take his medications. He initially reported hypoglycemia however today he complained of nausea and vomiting . He was recently discharged on 0/11/2022 following admission for DKA and COVID treated with Paxlovid.   ED Course: Initial vital signs showed HR of 110 beats/minute, BP 105/50 mm Hg, the RR 16 breaths/minute, and the oxygen saturation 100 % on RA and a temperature of 97.64F (36.5C). Pertinent Labs/Diagnostics Findings: Chemistry:Na+/ K+: 137/7.5 Glucose 897: BUN/Cr.  89/3.88, CO2 8, Anion gap 30 CBC: WBC: 5.9 Hgb/Hct: 10.5/38.0  Other Lab findings: Beta hydroxybutyrate>8.0, Lactic acid: 4.8 COVID PCR: positive on 04/07/22 Venous Blood Gas result:  pO2 51; pCO2 34; pH 7.02;  HCO3 8.8, %O2 Sat 58.1.    The laboratory data showed consistent with patient's well-known recurrent DKA. Urinalysis and chest xray demonstrated no evidence of infection.  Given ECG changes and severe AKI, the management was initiated with initial intravenous fluid, hyperkalemia correction with IV calcium gluconate, and intravenous (IV) bicarbonate push followed by intravenous insulin bolus and infusion as per DKA protocol. Serum electrolytes were closely monitored. PCCM consulted to admit for further management of DKA."   1/24: TR[facility] assumed care.  Pt off insulin drip.  Seen by podiatry for left foot osteomyelitis.  They plan for amputation this admission.  Blood cultures also positive for GPC's, suspect foot is the source.  Changed Augmentin to Unasyn.  Assessment and Plan: * DKA (diabetic ketoacidosis) ([place]) POA, resolved. Continue Levemir and Novolog Adjust for goal 140-180 Hypoglycemia protocol.  Osteomyelitis of left foot [facility]) Podiatry following, planning for amputation. Augmentin changed to Unasyn given concurrent GPC Bacteremia. Follow blood cultures and obtain repeats tomorrow  Sepsis Ruled Out.  Pt did not meet 2+ SIRS criteria, only tachycardia.  Lactic acidosis due to dehydration with DKA.  Acute renal failure superimposed on stage 3a chronic kidney disease ([place]) Cr on admission 2.96. Cr today 2.44 improving with IV hydration. Likely pre-renal azotemia.  Hyperkalemia Resolved.  Anemia of chronic disease Hbg stable. Monitor CBC.  Essential (primary) hypertension ACEI/ARC held due to AKI. Continue amlodipine. PRN IV hydralazine BP's uncontrolled this AM with SBP as high as 201  Type 1 diabetes mellitus with diabetic neuropathy, unspecified ([place]) See DKA  History of substance abuse ([place]) UDS positive for cocaine and cannabis. Counseled on importance of avoiding substances, these likely contribute to DKA.  History of CVA (cerebrovascular accident) Continue statin, ASA, Plavix  Dyslipidemia Continue statin  Multiple myeloma ([place]) -Currently on Revlimid -Follows with oncology        Subjective: Pt seen in ED holding for a bed today.  He reports feeling better.  He recalls meeting podiatrist yesterday but was not aware of plan for surgery/amputation.  He requests something to drink.  Otherwise denies acute complaints.   Physical Exam: Vitals:   04/22/22 2056 04/22/22 2358 04/23/22 0401 04/23/22 0448  BP: (!) 147/96 (!) 144/100 (!) 139/95   Pulse: 81 87  87   Resp: '19 20 20   '$ Temp: 98.7 F (37.1 C) 98.4 F (36.9 C) 98.7 F (37.1 C)   TempSrc:      SpO2: 100% 100% 100%   Weight:    62.3 kg  Height:        General exam: awake, alert, no acute distress HEENT: moist mucus membranes, hearing grossly normal  Respiratory system: CTAB but generally diminished, no wheezes, rales or rhonchi, normal respiratory effort. Cardiovascular system: normal S1/S2, RRR, no JVD, murmurs, rubs, gallops, no pedal edema.   Gastrointestinal system: soft, NT, ND, no HSM felt, +bowel sounds. Central nervous system: A&O x3. no gross focal neurologic deficits, normal speech Extremities: left foot with clean dry intact dressing in place, no edema, normal tone Skin: dry, intact, normal temperature, normal color Psychiatry: normal mood, congruent affect, judgement and insight appear normal   Data Reviewed:  Notable labs --- chloride 115, glucose 57 improved to 110.  Creatinine improved 2.44, BUN 75, albumin 3.0, procalcitonin 0.76 hemoglobin stable at 9.5  Family Communication: None  Disposition: Status is: Inpatient Remains inpatient appropriate because: Severity of illness remaining on IV fluids for AKI and IV antibiotics for bacteremia secondary to osteomyelitis with surgery planned.    Planned Discharge Destination: Home    Time spent: 40 minutes  Author: Ezekiel Slocumb, DO 04/23/2022 8:21 AM  For on call review [email or web address].

## 2022-04-22 NOTE — ED Notes (Addendum)
This RN to bedside, pt standing at side of bed pulling all monitoring cords and iv off.  PT urinated all over floor.  He states he is "trying to change his clothes" and begins walking out the door.  This RN reoriented pt back to bed.  He states he needs to urinate again, directed him towards toilet.  When finished redirected back to stretcher, steady gait and no assist.  Instructed pt to press call bell next time he needs to get up- pt verbalized understanding. Breakfast tray at bedside, RN encouraged pt to eat.

## 2022-04-22 NOTE — ED Notes (Signed)
This tech walked by pt rm and noticed that pt was standing on the side of the bed with the side rail up. Pt had urine all over the floor as well as the bed. Pt was assisted to sit in chair in rm while this tech cleaned urine from the floor and changed the linen on the bed. Pt advised to use call light when he needed something. I placed three warm blankets on pt at this time. I educated pt on how to use call light. Pt return demonstration on how to use call light. Pt has no further needs at this time. Liane Comber, RN made aware of situation.

## 2022-04-22 NOTE — Assessment & Plan Note (Signed)
Continue statin

## 2022-04-22 NOTE — ED Notes (Signed)
This RN to bedside, pt resting on stretcher in locked and lowest position.  Equal chest rise and fall noted, pt in nad.  Obtained CBG, pt ate half of [place] sandwich tray and drank small amount of juice.  Pt asked to turn lights off.

## 2022-04-22 NOTE — H&P (View-Only) (Signed)
I spoke with the patient by telephone and discussed the results of his MRI with osteomyelitis of the distal phalanx of the first and second toe.  Discussed treatment of this including antibiotics and surgical intervention with partial amputation.  Discussed with him that hopefully partial amputation of the second toe and may be partial amputation of the first toe, possible full amputation of great toe should hopefully eradicate infection and allow direct wound closure and healing.  We discussed postoperative recovery process including the period of restricted weightbearing for healing of his incisions.  He understands and wishes to proceed.  Surgery is scheduled for tomorrow.  N.p.o. past midnight.  Lanae Crumbly, DPM 04/22/2022

## 2022-04-22 NOTE — ED Notes (Signed)
Meal tray at bedside.  

## 2022-04-22 NOTE — Assessment & Plan Note (Signed)
ACEI/ARC held due to AKI. Continue amlodipine. PRN IV hydralazine BP's uncontrolled this AM with SBP as high as 201

## 2022-04-22 NOTE — Assessment & Plan Note (Addendum)
Podiatry following, planning for amputation. Surgery was delayed a few days due to  positive for cocaine, anesthesia here will not sedate. Underwent amputation 1/27 Continue Unasyn given concurrent Streptococcal Bacteremia. Follow repeat blood cultures. Appreciate Infectious Disease input. Echo negative for vegatation. Follow surgical pathology. ABI's in Dec 2024 were normal. ID plans 4 weeks antibiotics after day of surgery (1/27 through 2/24). Continue Unasyn. Discharge on Augmentin. Wound care Optimize glycemic control  Sepsis Ruled Out.  Pt did not meet 2+ SIRS criteria, only tachycardia.  Lactic acidosis due to dehydration with DKA.

## 2022-04-22 NOTE — Assessment & Plan Note (Addendum)
UDS positive for cocaine and cannabis. On admission.  Surgery had to be delayed until negative for cocaine.

## 2022-04-22 NOTE — ED Notes (Signed)
Pt requests aspirin for headache, no prns will notify MD.  When administering heparin injection in lower abdomen, pt sits up in bed and begins yelling at this RN to "[place]".  He then asks for phone to speak to his sister, when told I will bring him the phone he states "You're really hateful, you know".  This RN returned to room with cordless phone, per pt request.

## 2022-04-22 NOTE — Assessment & Plan Note (Addendum)
Cr on admission 2.96. Cr today 2.44 >> 1.45 >> 1.37...1.14 Improved with IV hydration. Likely pre-renal azotemia.

## 2022-04-23 ENCOUNTER — Encounter: Payer: Self-pay | Admitting: Anesthesiology

## 2022-04-23 DIAGNOSIS — L97502 Non-pressure chronic ulcer of other part of unspecified foot with fat layer exposed: Secondary | ICD-10-CM | POA: Diagnosis not present

## 2022-04-23 DIAGNOSIS — E11621 Type 2 diabetes mellitus with foot ulcer: Secondary | ICD-10-CM

## 2022-04-23 DIAGNOSIS — R634 Abnormal weight loss: Secondary | ICD-10-CM | POA: Diagnosis present

## 2022-04-23 DIAGNOSIS — E1010 Type 1 diabetes mellitus with ketoacidosis without coma: Secondary | ICD-10-CM | POA: Diagnosis not present

## 2022-04-23 DIAGNOSIS — D72819 Decreased white blood cell count, unspecified: Secondary | ICD-10-CM

## 2022-04-23 DIAGNOSIS — M86072 Acute hematogenous osteomyelitis, left ankle and foot: Secondary | ICD-10-CM

## 2022-04-23 DIAGNOSIS — R7881 Bacteremia: Secondary | ICD-10-CM | POA: Diagnosis not present

## 2022-04-23 DIAGNOSIS — Z794 Long term (current) use of insulin: Secondary | ICD-10-CM

## 2022-04-23 DIAGNOSIS — Z8579 Personal history of other malignant neoplasms of lymphoid, hematopoietic and related tissues: Secondary | ICD-10-CM

## 2022-04-23 DIAGNOSIS — M869 Osteomyelitis, unspecified: Secondary | ICD-10-CM | POA: Diagnosis not present

## 2022-04-23 DIAGNOSIS — N179 Acute kidney failure, unspecified: Secondary | ICD-10-CM | POA: Diagnosis not present

## 2022-04-23 DIAGNOSIS — B955 Unspecified streptococcus as the cause of diseases classified elsewhere: Secondary | ICD-10-CM

## 2022-04-23 LAB — URINE DRUG SCREEN, QUALITATIVE (ARMC ONLY)
Amphetamines, Ur Screen: NOT DETECTED
Barbiturates, Ur Screen: NOT DETECTED
Benzodiazepine, Ur Scrn: NOT DETECTED
Cannabinoid 50 Ng, Ur ~~LOC~~: POSITIVE — AB
Cocaine Metabolite,Ur ~~LOC~~: POSITIVE — AB
MDMA (Ecstasy)Ur Screen: NOT DETECTED
Methadone Scn, Ur: NOT DETECTED
Opiate, Ur Screen: NOT DETECTED
Phencyclidine (PCP) Ur S: NOT DETECTED
Tricyclic, Ur Screen: NOT DETECTED

## 2022-04-23 LAB — GLUCOSE, CAPILLARY
Glucose-Capillary: 11 mg/dL — CL (ref 70–99)
Glucose-Capillary: 127 mg/dL — ABNORMAL HIGH (ref 70–99)
Glucose-Capillary: 141 mg/dL — ABNORMAL HIGH (ref 70–99)
Glucose-Capillary: 165 mg/dL — ABNORMAL HIGH (ref 70–99)
Glucose-Capillary: 252 mg/dL — ABNORMAL HIGH (ref 70–99)
Glucose-Capillary: 310 mg/dL — ABNORMAL HIGH (ref 70–99)
Glucose-Capillary: 329 mg/dL — ABNORMAL HIGH (ref 70–99)
Glucose-Capillary: 33 mg/dL — CL (ref 70–99)
Glucose-Capillary: 383 mg/dL — ABNORMAL HIGH (ref 70–99)
Glucose-Capillary: 41 mg/dL — CL (ref 70–99)
Glucose-Capillary: 59 mg/dL — ABNORMAL LOW (ref 70–99)
Glucose-Capillary: <10 mg/dL — CL (ref 70–99)

## 2022-04-23 LAB — CBC
HCT: 32.5 % — ABNORMAL LOW (ref 39.0–52.0)
Hemoglobin: 10.2 g/dL — ABNORMAL LOW (ref 13.0–17.0)
MCH: 29 pg (ref 26.0–34.0)
MCHC: 31.4 g/dL (ref 30.0–36.0)
MCV: 92.3 fL (ref 80.0–100.0)
Platelets: 229 10*3/uL (ref 150–400)
RBC: 3.52 MIL/uL — ABNORMAL LOW (ref 4.22–5.81)
RDW: 16.4 % — ABNORMAL HIGH (ref 11.5–15.5)
WBC: 2.9 10*3/uL — ABNORMAL LOW (ref 4.0–10.5)
nRBC: 0 % (ref 0.0–0.2)

## 2022-04-23 LAB — RENAL FUNCTION PANEL
Albumin: 3.1 g/dL — ABNORMAL LOW (ref 3.5–5.0)
Anion gap: 10 (ref 5–15)
BUN: 46 mg/dL — ABNORMAL HIGH (ref 8–23)
CO2: 23 mmol/L (ref 22–32)
Calcium: 8.3 mg/dL — ABNORMAL LOW (ref 8.9–10.3)
Chloride: 108 mmol/L (ref 98–111)
Creatinine, Ser: 1.45 mg/dL — ABNORMAL HIGH (ref 0.61–1.24)
GFR, Estimated: 52 mL/min — ABNORMAL LOW (ref >60)
Glucose, Bld: 349 mg/dL — ABNORMAL HIGH (ref 70–99)
Phosphorus: 2.9 mg/dL (ref 2.5–4.6)
Potassium: 4.3 mmol/L (ref 3.5–5.1)
Sodium: 141 mmol/L (ref 135–145)

## 2022-04-23 LAB — PROCALCITONIN: Procalcitonin: 0.22 ng/mL

## 2022-04-23 LAB — MAGNESIUM: Magnesium: 1.9 mg/dL (ref 1.7–2.4)

## 2022-04-23 MED ORDER — INSULIN DETEMIR 100 UNIT/ML ~~LOC~~ SOLN
16.0000 [IU] | Freq: Every day | SUBCUTANEOUS | Status: DC
  Administered 2022-04-23: 16 [IU] via SUBCUTANEOUS
  Filled 2022-04-23 (×2): qty 0.16

## 2022-04-23 MED ORDER — ZINC SULFATE 220 (50 ZN) MG PO CAPS
220.0000 mg | ORAL_CAPSULE | Freq: Every day | ORAL | Status: DC
  Administered 2022-04-23 – 2022-04-29 (×7): 220 mg via ORAL
  Filled 2022-04-23 (×7): qty 1

## 2022-04-23 MED ORDER — ADULT MULTIVITAMIN W/MINERALS CH
1.0000 | ORAL_TABLET | Freq: Every day | ORAL | Status: DC
  Administered 2022-04-23 – 2022-04-29 (×7): 1 via ORAL
  Filled 2022-04-23 (×7): qty 1

## 2022-04-23 MED ORDER — SODIUM CHLORIDE 0.9 % IV SOLN: INTRAVENOUS | Status: DC

## 2022-04-23 MED ORDER — ENSURE ENLIVE PO LIQD
237.0000 mL | Freq: Three times a day (TID) | ORAL | Status: DC
  Administered 2022-04-23 – 2022-04-28 (×12): 237 mL via ORAL

## 2022-04-23 MED ORDER — VITAMIN C 500 MG PO TABS
500.0000 mg | ORAL_TABLET | Freq: Two times a day (BID) | ORAL | Status: DC
  Administered 2022-04-23 – 2022-04-29 (×12): 500 mg via ORAL
  Filled 2022-04-23 (×12): qty 1

## 2022-04-23 MED ORDER — SODIUM CHLORIDE 0.9 % IV SOLN
3.0000 g | Freq: Four times a day (QID) | INTRAVENOUS | Status: DC
  Administered 2022-04-23 – 2022-04-29 (×23): 3 g via INTRAVENOUS
  Filled 2022-04-23: qty 8
  Filled 2022-04-23: qty 3
  Filled 2022-04-23 (×4): qty 8
  Filled 2022-04-23 (×2): qty 3
  Filled 2022-04-23 (×4): qty 8
  Filled 2022-04-23: qty 3
  Filled 2022-04-23: qty 8
  Filled 2022-04-23: qty 3
  Filled 2022-04-23 (×3): qty 8
  Filled 2022-04-23 (×4): qty 3
  Filled 2022-04-23 (×3): qty 8
  Filled 2022-04-23: qty 3

## 2022-04-23 NOTE — TOC Initial Note (Signed)
Transition of Care [facility]) - Initial/Assessment Note    Patient Details  Name: Nicholas Weiss MRN: [id number] Date of Birth: 01-05-1951  [facility]) CM/SW Contact:    Quin Hoop, LCSW Phone Number: 04/23/2022, 12:28 PM  Clinical Narrative:                 Initial assessment complete.  Disposition unknown at this time.  Patient's PCP is Dr. Netty Starring.  He states that he gets his prescriptions filled at [facility] on [address].  Patient states that he doesn't have any DME.  He lives with his girlfriend and son.    Expected Discharge Plan:  (unknown at this time) Barriers to Discharge: Continued Medical Work up   Patient Goals and CMS Choice            Expected Discharge Plan and Services      Unknown at this time                                        Prior Living Arrangements/Services  Home with girlfriend and son                  Criminal Activity/Legal Involvement Pertinent to Current Situation/Hospitalization: No - Comment as needed  Activities of Daily Living Home Assistive Devices/Equipment: Cane (specify quad or straight) ADL Screening (condition at time of admission) Patient's cognitive ability adequate to safely complete daily activities?: Yes Is the patient deaf or have difficulty hearing?: No Does the patient have difficulty seeing, even when wearing glasses/contacts?: No Does the patient have difficulty concentrating, remembering, or making decisions?: No Patient able to express need for assistance with ADLs?: Yes Does the patient have difficulty dressing or bathing?: No Independently performs ADLs?: Yes (appropriate for developmental age) Does the patient have difficulty walking or climbing stairs?: Yes Weakness of Legs: Both Weakness of Arms/Hands: None  Permission Sought/Granted                  Emotional Assessment Appearance:: Appears stated age Attitude/Demeanor/Rapport: Engaged Affect (typically  observed): Appropriate Orientation: : Oriented to Self, Oriented to Place, Oriented to  Time, Oriented to Situation Alcohol / Substance Use: Not Applicable Psych Involvement: No (comment)  Admission diagnosis:  Hyperkalemia [E87.5] DKA (diabetic ketoacidosis) ([place]) [E11.10] DKA, type 1 ([place]) [E10.10] Diabetic ketoacidosis without coma associated with type 1 diabetes mellitus ([place]) [E10.10] Diabetic ulcer of toe associated with diabetes mellitus due to underlying condition, with fat layer exposed, unspecified laterality ([place]) [C94.496, L97.502] Patient Active Problem List   Diagnosis Date Noted   Osteomyelitis of left foot ([place]) 04/22/2022   DKA, type 1 ([place]) 04/22/2022   Dehydration 04/08/2022   Diabetic ketoacidosis without coma associated with type 1 diabetes mellitus ([place]) 04/07/2022   Hyperkalemia 04/07/2022   DKA (diabetic ketoacidosis) ([place]) 04/07/2022   Traumatic pneumothorax 02/18/2022   Multiple fractures of ribs, left side, initial encounter for closed fracture 02/18/2022   Pneumothorax, closed, traumatic 02/18/2022   Benign prostatic hyperplasia with nocturia 02/12/2022   Stroke (cerebrum) ([place]) 11/07/2021   Multiple myeloma ([place]) 09/29/2021   Multiple myeloma not having achieved remission ([place]) 09/29/2021   Chronic hyponatremia 08/18/2021   COVID-19 virus infection 08/13/2021   History of CVA (cerebrovascular accident) 08/13/2021   Syncope and collapse 08/09/2021   Hepatic lesion 08/05/2021   Dyslipidemia 05/08/2021   History of substance abuse ([place]) 12/09/2020  Acute metabolic encephalopathy 88/50/2774   Cocaine abuse ([place]) 11/30/2020   Anemia of chronic disease 11/08/2020   Muscle twitching 09/12/2020   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   Pre-syncope 08/01/2020   Heart block AV first degree 08/01/2020   Anemia of chronic kidney failure, stage 3 (moderate) ([place]) 05/28/2020   PUD (peptic ulcer disease)    Esophageal dysphagia    Encounter for screening  colonoscopy    Generalized weakness 07/28/2019   Hypoglycemia 04/27/2019   Unresponsiveness 04/27/2019   Lung nodule 04/07/2019   Uncontrolled type 1 diabetes mellitus    Acute renal failure superimposed on stage 3a chronic kidney disease ([place])    CKD (chronic kidney disease) stage 3, GFR 30-59 ml/min ([place]) 04/05/2019   Hypercholesterolemia 01/24/2016   Essential (primary) hypertension 06/07/2006   Type 1 diabetes mellitus with diabetic neuropathy, unspecified ([place]) 05/31/2006   PCP:  Dion Body, MD Pharmacy:   [facility] [address]), [place]) [place] [address] Phone: [phone] Fax: [phone]  [facility] [place], [address] [place] [address] Phone: [phone] Fax: [phone]  [place], [address] [place] [address] Phone: [phone] Fax: [phone]  [name] [address] [place] [address] Phone: [phone] Fax: [phone]     Social Determinants of Health (SDOH) Social History: SDOH Screenings   Food Insecurity: No Food Insecurity (04/21/2022)  Housing: Low Risk  (04/21/2022)  Transportation Needs: No Transportation Needs (04/21/2022)  Utilities: Not At Risk (04/21/2022)  Alcohol Screen: Low Risk  (10/02/2020)  Depression (PHQ2-9): Medium Risk (10/02/2020)  Financial Resource Strain: Low Risk  (01/01/2020)  Physical Activity: Insufficiently Active (01/01/2020)  Social Connections: Moderately Isolated (01/01/2020)  Stress: No Stress Concern Present (01/01/2020)  Tobacco Use: Low Risk  (04/21/2022)   SDOH Interventions:     Readmission Risk Interventions    03/12/2022   10:22 AM 02/25/2022    2:43 PM 12/08/2021   10:33 AM  Readmission Risk Prevention Plan  Transportation Screening Complete Complete  Complete  Medication Review Press photographer) Complete Complete Complete  PCP or Specialist appointment within 3-5 days of discharge Complete Complete Complete  HRI or Home Care Consult Complete Complete Complete  SW Recovery Care/Counseling Consult Complete  Complete  Palliative Care Screening Not Applicable Not Applicable Not [place] Not Applicable Not Applicable Not Applicable

## 2022-04-23 NOTE — Consult Note (Signed)
[facility]  Total days of antibiotics 2 amp/sub         Reason for Consult: diabetic foot osteomyelitis and secondary strep bacteremia   Referring Physician: griffith  Principal Problem:   DKA (diabetic ketoacidosis) ([place]) Active Problems:   Type 1 diabetes mellitus with diabetic neuropathy, unspecified ([place])   Essential (primary) hypertension   Acute renal failure superimposed on stage 3a chronic kidney disease (HCC)   Multiple myeloma (HCC)   Anemia of chronic disease   Dyslipidemia   History of CVA (cerebrovascular accident)   History of substance abuse ([place])   Hyperkalemia   Osteomyelitis of left foot ([place])   DKA, type 1 (HCC)    HPI: Nicholas Weiss is a 72 y.o. male with hx of T1DM, CKD3, MM not in remission, smoldering MM, with 4:!4 translocation and gain of 1q.s/p 7 cycles of revlimid and velcade (plan to start daratumumab with velcade an revlimid next week) Followed by oncology, has lost 24lb in the last month. Recovering from covid diagnosed near new year(2 wks ago) He was admitted on 1/23 for feeling poorly, worsening BS fluctuatons including dropping, with poor appetite. On admit, his labs showed BS of 897 and AG of 30. His LA of 4.8 but his physical exam did show macerated ulcer to left great toe and smaller lesion to tip of 2nd digit of left foot. The great toe encompases the distal tuft that is concern for osteomyelitis of the great toe. MRI showed that he has osteo to great toe distal phalanx and small abscess as well as early osteo to distal 2nd toe. Podiatry was consulted for surgical management. Infectious work up thus far showed -- blood cx + strep species/GPC on both sets. Wound cx from foot also showing GPC and rare GPR. He is tolerating amp/sub. No fevers.   Past Medical History:  Diagnosis Date   DKA (diabetic ketoacidoses) 04/06/2016   Hypercholesteremia    Hypertension     Allergies:  Allergies  Allergen Reactions   Penicillins  Other (See Comments)    Childhood allergy -tolerated amoxil 03/2022 Unknown reaction Has patient had a PCN reaction causing immediate rash, facial/tongue/throat swelling, SOB or lightheadedness with hypotension: No Has patient had a PCN reaction causing severe rash involving mucus membranes or skin necrosis: No Has patient had a PCN reaction that required hospitalization No Has patient had a PCN reaction occurring within the last 10 years: No If all of the above answers are "NO", then may proceed with Cephalosporin use.     Current antibiotics:   MEDICATIONS:  amLODipine  10 mg Oral Daily   heparin  5,000 Units Subcutaneous Q8H   insulin aspart  0-5 Units Subcutaneous QHS   insulin aspart  0-9 Units Subcutaneous TID WC   insulin detemir  16 Units Subcutaneous Daily    Social History   Tobacco Use   Smoking status: Never   Smokeless tobacco: Never  Vaping Use   Vaping Use: Never used  Substance Use Topics   Alcohol use: Not Currently    Alcohol/week: 0.0 - 1.0 standard drinks of alcohol    Comment: "once every 2 months"   Drug use: Not Currently    Types: Marijuana, "Crack" cocaine    Comment: last week    Family History  Problem Relation Age of Onset   Heart attack Father    Hypertension Sister    Cancer Sister     Review of Systems -  Sugar fluctuation.  Loss of appetite. Unintended weight loss. Wound to left foot x 2 weeks. Otherwise 12 point ros is negative  OBJECTIVE: Temp:  [98 F (36.7 C)-98.7 F (37.1 C)] 98 F (36.7 C) (01/25 1123) Pulse Rate:  [81-88] 85 (01/25 1123) Resp:  [18-20] 20 (01/25 1123) BP: (110-161)/(81-100) 110/81 (01/25 1123) SpO2:  [99 %-100 %] 100 % (01/25 1123) Weight:  [62.3 kg] 62.3 kg (01/25 0448) Physical Exam  Constitutional: He is oriented to person, place, and time. He appears chronically ill and malnourished and underweight. No distress.  HENT:  Mouth/Throat: Oropharynx is clear and moist. No oropharyngeal exudate.   Cardiovascular: Normal rate, regular rhythm and normal heart sounds. Exam reveals no gallop and no friction rub.  No murmur heard.  Pulmonary/Chest: Effort normal and breath sounds normal. No respiratory distress. He has no wheezes.  Abdominal: Soft. Bowel sounds are normal. He exhibits no distension. There is no tenderness.  Lymphadenopathy:  He has no cervical adenopathy.  Neurological: He is alert and oriented to person, place, and time.  Skin: Skin is warm and dry. No rash noted. No erythema.  Psychiatric: He has a normal mood and affect. His behavior is normal.    LABS: Results for orders placed or performed during the hospital encounter of 04/21/22 (from the past 48 hour(s))  CBG monitoring, ED     Status: Abnormal   Collection Time: 04/21/22  2:47 PM  Result Value Ref Range   Glucose-Capillary 101 (H) 70 - 99 mg/dL    Comment: Glucose reference range applies only to samples taken after fasting for at least 8 hours.  Urinalysis, Complete w Microscopic     Status: Abnormal   Collection Time: 04/21/22  4:36 PM  Result Value Ref Range   Color, Urine YELLOW (A) YELLOW   APPearance HAZY (A) CLEAR   Specific Gravity, Urine 1.014 1.005 - 1.030   pH 5.0 5.0 - 8.0   Glucose, UA >=500 (A) NEGATIVE mg/dL   Hgb urine dipstick NEGATIVE NEGATIVE   Bilirubin Urine NEGATIVE NEGATIVE   Ketones, ur 5 (A) NEGATIVE mg/dL   Protein, ur NEGATIVE NEGATIVE mg/dL   Nitrite NEGATIVE NEGATIVE   Leukocytes,Ua NEGATIVE NEGATIVE   RBC / HPF 0-5 0 - 5 RBC/hpf   WBC, UA 0-5 0 - 5 WBC/hpf   Bacteria, UA RARE (A) NONE SEEN   Squamous Epithelial / HPF NONE SEEN 0 - 5 /HPF   Mucus PRESENT     Comment: Performed at [facility], [address], [place], [place] [address]  Urine Drug Screen, Qualitative ([facility] only)     Status: Abnormal   Collection Time: 04/21/22  4:36 PM  Result Value Ref Range   Tricyclic, Ur Screen NONE DETECTED NONE DETECTED   Amphetamines, Ur Screen NONE DETECTED NONE  DETECTED   MDMA (Ecstasy)Ur Screen NONE DETECTED NONE DETECTED   Cocaine Metabolite,Ur [place] POSITIVE (A) NONE DETECTED   Opiate, Ur Screen NONE DETECTED NONE DETECTED   Phencyclidine (PCP) Ur S NONE DETECTED NONE DETECTED   Cannabinoid 50 Ng, Ur [place] POSITIVE (A) NONE DETECTED   Barbiturates, Ur Screen NONE DETECTED NONE DETECTED   Benzodiazepine, Ur Scrn NONE DETECTED NONE DETECTED   Methadone Scn, Ur NONE DETECTED NONE DETECTED    Comment: (NOTE) Tricyclics + metabolites, urine    Cutoff 1000 ng/mL Amphetamines + metabolites, urine  Cutoff 1000 ng/mL MDMA (Ecstasy), urine              Cutoff 500 ng/mL Cocaine Metabolite, urine  Cutoff 300 ng/mL Opiate + metabolites, urine        Cutoff 300 ng/mL Phencyclidine (PCP), urine         Cutoff 25 ng/mL Cannabinoid, urine                 Cutoff 50 ng/mL Barbiturates + metabolites, urine  Cutoff 200 ng/mL Benzodiazepine, urine              Cutoff 200 ng/mL Methadone, urine                   Cutoff 300 ng/mL  The urine drug screen provides only a preliminary, unconfirmed analytical test result and should not be used for non-medical purposes. Clinical consideration and professional judgment should be applied to any positive drug screen result due to possible interfering substances. A more specific alternate chemical method must be used in order to obtain a confirmed analytical result. Gas chromatography / mass spectrometry (GC/MS) is the preferred confirm atory method. Performed at [facility], [address] [place], [place] [address]   CBG monitoring, ED     Status: Abnormal   Collection Time: 04/21/22  4:39 PM  Result Value Ref Range   Glucose-Capillary 111 (H) 70 - 99 mg/dL    Comment: Glucose reference range applies only to samples taken after fasting for at least 8 hours.  CBG monitoring, ED     Status: Abnormal   Collection Time: 04/21/22 10:25 PM  Result Value Ref Range   Glucose-Capillary 23 (LL) 70 - 99 mg/dL     Comment: Glucose reference range applies only to samples taken after fasting for at least 8 hours.   Comment 1 Document in Chart   CBG monitoring, ED     Status: Abnormal   Collection Time: 04/21/22 10:47 PM  Result Value Ref Range   Glucose-Capillary 126 (H) 70 - 99 mg/dL    Comment: Glucose reference range applies only to samples taken after fasting for at least 8 hours.  CBG monitoring, ED     Status: None   Collection Time: 04/21/22 11:29 PM  Result Value Ref Range   Glucose-Capillary 92 70 - 99 mg/dL    Comment: Glucose reference range applies only to samples taken after fasting for at least 8 hours.  CBG monitoring, ED     Status: None   Collection Time: 04/22/22 12:17 AM  Result Value Ref Range   Glucose-Capillary 89 70 - 99 mg/dL    Comment: Glucose reference range applies only to samples taken after fasting for at least 8 hours.  CBG monitoring, ED     Status: None   Collection Time: 04/22/22  2:50 AM  Result Value Ref Range   Glucose-Capillary 83 70 - 99 mg/dL    Comment: Glucose reference range applies only to samples taken after fasting for at least 8 hours.  CBC     Status: Abnormal   Collection Time: 04/22/22  4:15 AM  Result Value Ref Range   WBC 5.0 4.0 - 10.5 K/uL   RBC 3.23 (L) 4.22 - 5.81 MIL/uL   Hemoglobin 9.5 (L) 13.0 - 17.0 g/dL   HCT 30.0 (L) 39.0 - 52.0 %   MCV 92.9 80.0 - 100.0 fL   MCH 29.4 26.0 - 34.0 pg   MCHC 31.7 30.0 - 36.0 g/dL   RDW 16.1 (H) 11.5 - 15.5 %   Platelets 249 150 - 400 K/uL   nRBC 0.0 0.0 - 0.2 %    Comment:  Performed at [facility], [address] [place], [place] [address]  Renal function panel     Status: Abnormal   Collection Time: 04/22/22  4:15 AM  Result Value Ref Range   Sodium 145 135 - 145 mmol/L   Potassium 4.0 3.5 - 5.1 mmol/L   Chloride 115 (H) 98 - 111 mmol/L   CO2 24 22 - 32 mmol/L   Glucose, Bld 57 (L) 70 - 99 mg/dL    Comment: Glucose reference range applies only to samples taken after fasting  for at least 8 hours.   BUN 75 (H) 8 - 23 mg/dL   Creatinine, Ser 2.44 (H) 0.61 - 1.24 mg/dL   Calcium 8.6 (L) 8.9 - 10.3 mg/dL   Phosphorus 3.4 2.5 - 4.6 mg/dL   Albumin 3.0 (L) 3.5 - 5.0 g/dL   GFR, Estimated 28 (L) >60 mL/min    Comment: (NOTE) Calculated using the CKD-EPI Creatinine Equation (2021)    Anion gap 6 5 - 15    Comment: Performed at [facility], [address] [place], [place] [address]  Procalcitonin     Status: None   Collection Time: 04/22/22  4:15 AM  Result Value Ref Range   Procalcitonin 0.76 ng/mL    Comment:        Interpretation: PCT > 0.5 ng/mL and <= 2 ng/mL: Systemic infection (sepsis) is possible, but other conditions are known to elevate PCT as well. (NOTE)       Sepsis PCT Algorithm           Lower Respiratory Tract                                      Infection PCT Algorithm    ----------------------------     ----------------------------         PCT < 0.25 ng/mL                PCT < 0.10 ng/mL          Strongly encourage             Strongly discourage   discontinuation of antibiotics    initiation of antibiotics    ----------------------------     -----------------------------       PCT 0.25 - 0.50 ng/mL            PCT 0.10 - 0.25 ng/mL               OR       >80% decrease in PCT            Discourage initiation of                                            antibiotics      Encourage discontinuation           of antibiotics    ----------------------------     -----------------------------         PCT >= 0.50 ng/mL              PCT 0.26 - 0.50 ng/mL                AND       <80% decrease in PCT  Encourage initiation of                                             antibiotics       Encourage continuation           of antibiotics    ----------------------------     -----------------------------        PCT >= 0.50 ng/mL                  PCT > 0.50 ng/mL               AND         increase in PCT                   Strongly encourage                                      initiation of antibiotics    Strongly encourage escalation           of antibiotics                                     -----------------------------                                           PCT <= 0.25 ng/mL                                                 OR                                        > 80% decrease in PCT                                      Discontinue / Do not initiate                                             antibiotics  Performed at [facility], [address] [place], [place] [address]   Magnesium     Status: None   Collection Time: 04/22/22  4:15 AM  Result Value Ref Range   Magnesium 2.2 1.7 - 2.4 mg/dL    Comment: Performed at [facility], [address] [place], [place] [address]  CBG monitoring, ED     Status: Abnormal   Collection Time: 04/22/22  6:34 AM  Result Value Ref Range   Glucose-Capillary 110 (H) 70 - 99 mg/dL    Comment: Glucose reference range applies only to samples taken after fasting for at least 8 hours.  CBG monitoring, ED     Status: None   Collection Time:  04/22/22  7:38 AM  Result Value Ref Range   Glucose-Capillary 86 70 - 99 mg/dL    Comment: Glucose reference range applies only to samples taken after fasting for at least 8 hours.  CBG monitoring, ED     Status: None   Collection Time: 04/22/22 11:25 AM  Result Value Ref Range   Glucose-Capillary 71 70 - 99 mg/dL    Comment: Glucose reference range applies only to samples taken after fasting for at least 8 hours.  Glucose, capillary     Status: Abnormal   Collection Time: 04/22/22  4:20 PM  Result Value Ref Range   Glucose-Capillary 127 (H) 70 - 99 mg/dL    Comment: Glucose reference range applies only to samples taken after fasting for at least 8 hours.  Glucose, capillary     Status: Abnormal   Collection Time: 04/22/22  5:48 PM  Result Value Ref Range   Glucose-Capillary 111 (H) 70 - 99 mg/dL     Comment: Glucose reference range applies only to samples taken after fasting for at least 8 hours.  Glucose, capillary     Status: Abnormal   Collection Time: 04/22/22  7:50 PM  Result Value Ref Range   Glucose-Capillary 114 (H) 70 - 99 mg/dL    Comment: Glucose reference range applies only to samples taken after fasting for at least 8 hours.  Glucose, capillary     Status: Abnormal   Collection Time: 04/22/22 11:53 PM  Result Value Ref Range   Glucose-Capillary 262 (H) 70 - 99 mg/dL    Comment: Glucose reference range applies only to samples taken after fasting for at least 8 hours.  CBC     Status: Abnormal   Collection Time: 04/23/22  3:52 AM  Result Value Ref Range   WBC 2.9 (L) 4.0 - 10.5 K/uL   RBC 3.52 (L) 4.22 - 5.81 MIL/uL   Hemoglobin 10.2 (L) 13.0 - 17.0 g/dL   HCT 32.5 (L) 39.0 - 52.0 %   MCV 92.3 80.0 - 100.0 fL   MCH 29.0 26.0 - 34.0 pg   MCHC 31.4 30.0 - 36.0 g/dL   RDW 16.4 (H) 11.5 - 15.5 %   Platelets 229 150 - 400 K/uL   nRBC 0.0 0.0 - 0.2 %    Comment: Performed at [facility], [address] [place], [place] [address]  Renal function panel     Status: Abnormal   Collection Time: 04/23/22  3:52 AM  Result Value Ref Range   Sodium 141 135 - 145 mmol/L   Potassium 4.3 3.5 - 5.1 mmol/L   Chloride 108 98 - 111 mmol/L   CO2 23 22 - 32 mmol/L   Glucose, Bld 349 (H) 70 - 99 mg/dL    Comment: Glucose reference range applies only to samples taken after fasting for at least 8 hours.   BUN 46 (H) 8 - 23 mg/dL   Creatinine, Ser 1.45 (H) 0.61 - 1.24 mg/dL   Calcium 8.3 (L) 8.9 - 10.3 mg/dL   Phosphorus 2.9 2.5 - 4.6 mg/dL   Albumin 3.1 (L) 3.5 - 5.0 g/dL   GFR, Estimated 52 (L) >60 mL/min    Comment: (NOTE) Calculated using the CKD-EPI Creatinine Equation (2021)    Anion gap 10 5 - 15    Comment: Performed at [facility], [address] [place], [place] [address]  Procalcitonin     Status: None   Collection Time: 04/23/22  3:52 AM   Result  Value Ref Range   Procalcitonin 0.22 ng/mL    Comment:        Interpretation: PCT (Procalcitonin) <= 0.5 ng/mL: Systemic infection (sepsis) is not likely. Local bacterial infection is possible. (NOTE)       Sepsis PCT Algorithm           Lower Respiratory Tract                                      Infection PCT Algorithm    ----------------------------     ----------------------------         PCT < 0.25 ng/mL                PCT < 0.10 ng/mL          Strongly encourage             Strongly discourage   discontinuation of antibiotics    initiation of antibiotics    ----------------------------     -----------------------------       PCT 0.25 - 0.50 ng/mL            PCT 0.10 - 0.25 ng/mL               OR       >80% decrease in PCT            Discourage initiation of                                            antibiotics      Encourage discontinuation           of antibiotics    ----------------------------     -----------------------------         PCT >= 0.50 ng/mL              PCT 0.26 - 0.50 ng/mL               AND        <80% decrease in PCT             Encourage initiation of                                             antibiotics       Encourage continuation           of antibiotics    ----------------------------     -----------------------------        PCT >= 0.50 ng/mL                  PCT > 0.50 ng/mL               AND         increase in PCT                  Strongly encourage                                      initiation of antibiotics    Strongly encourage escalation  of antibiotics                                     -----------------------------                                           PCT <= 0.25 ng/mL                                                 OR                                        > 80% decrease in PCT                                      Discontinue / Do not initiate                                              antibiotics  Performed at [facility], [address] [place], [place] [address]   Magnesium     Status: None   Collection Time: 04/23/22  3:52 AM  Result Value Ref Range   Magnesium 1.9 1.7 - 2.4 mg/dL    Comment: Performed at [facility], [address] [place], [place] [address]  Glucose, capillary     Status: Abnormal   Collection Time: 04/23/22  4:03 AM  Result Value Ref Range   Glucose-Capillary 329 (H) 70 - 99 mg/dL    Comment: Glucose reference range applies only to samples taken after fasting for at least 8 hours.  Glucose, capillary     Status: Abnormal   Collection Time: 04/23/22  4:07 AM  Result Value Ref Range   Glucose-Capillary 310 (H) 70 - 99 mg/dL    Comment: Glucose reference range applies only to samples taken after fasting for at least 8 hours.  Glucose, capillary     Status: Abnormal   Collection Time: 04/23/22  7:47 AM  Result Value Ref Range   Glucose-Capillary 383 (H) 70 - 99 mg/dL    Comment: Glucose reference range applies only to samples taken after fasting for at least 8 hours.  Glucose, capillary     Status: Abnormal   Collection Time: 04/23/22 11:26 AM  Result Value Ref Range   Glucose-Capillary 252 (H) 70 - 99 mg/dL    Comment: Glucose reference range applies only to samples taken after fasting for at least 8 hours.    MICRO: 1/23 wound cx - group b strep 1/23 blood cx - confirmation pending/BCID - strep species IMAGING: MR FOOT LEFT WO CONTRAST  Result Date: 04/21/2022 CLINICAL DATA:  Foot swelling, diabetic, osteomyelitis suspected, xray done EXAM: MRI OF THE LEFT FOOT WITHOUT CONTRAST TECHNIQUE: Multiplanar, multisequence MR imaging of the left forefoot was performed. No intravenous contrast was administered. COMPARISON:  Left foot radiograph 04/21/2022 FINDINGS: Bones/Joint/Cartilage There is marrow edema  and confluent low T1 signal within the great toe distal phalanx with bony fragmentation. There is focal marrow edema  at the lateral base of the great toe proximal phalanx core with small fracture fragment as seen on recent radiograph. There is moderate first MTP osteoarthritis with mild periarticular subchondral marrow edema. There is also marrow edema within the second toe distal phalanx and mildly low T1 signal in the distal phalangeal tuft (coronal T2 and T1 image 5). Ligaments Intact Lisfranc ligament. Muscles and Tendons Diffuse muscle edema and developing atrophy in the foot, compatible with denervation change. Soft tissues Diffuse soft tissue swelling of the foot. Soft tissue ulcer at the tip of the great toe with underlying lobular confluent fluid measuring 1.2 x 1.0 x 1.3 cm (axial T2 image 6, coronal T2 image 9). Probable soft tissue ulcer at the tip of the second toe. IMPRESSION: Osteomyelitis of the great toe distal phalanx with adjacent ulcer and lobular confluent fluid at the tip of the great toe suspicious for small abscess measuring 1.2 x 1.0 x 1.3 cm. Early osteomyelitis of the second toe distal phalanx with adjacent ulcer and soft tissue swelling. Moderate first MTP osteoarthritis with periarticular marrow edema and possible small fracture at the lateral proximal phalangeal base. Electronically Signed   By: Maurine Simmering M.D.   On: 04/21/2022 15:53   DG Foot Complete Left  Result Date: 04/21/2022 CLINICAL DATA:  Wound dehiscence EXAM: LEFT FOOT - COMPLETE 3+ VIEW COMPARISON:  None Available. FINDINGS: There is soft tissue irregularity overlying the great toe distal phalanx. There is significant osseous erosion/destruction involving the underlying distal phalanx consistent with osteomyelitis. There is cortical irregularity at the base of the great toe proximal phalanx along the medial aspect There is no other acute osseous abnormality. Bony alignment is normal. IMPRESSION: 1. Soft tissue irregularity overlying the great toe distal phalanx with destruction of the underlying bone consistent with osteomyelitis. 2.  Cortical irregularity of the medial aspect of the base of the great toe proximal phalanx could reflect fracture or additional site of infection. Electronically Signed   By: Valetta Mole M.D.   On: 04/21/2022 13:21    HISTORICAL MICRO/IMAGING  Assessment/Plan: 71yo M with T1DM and immunosuppression admitted for DKA with sepsis due to diabetic foot ulcer/osteomyelitis with secondary streptococcal bacteremia  - continue on amp/sub for the time being - please send tissue culture when he goes for amputation/debridement - would be helpful to send proximal margin for path to see if have clean margins - recommend to get TTE. - will provide further recs  - appears covid-19 recovered, no pulmonary symptoms - recommend to get ABI of lower extremities to assess vasculature - concern for his unintentional weight loss of late - may benefit from nutritionist consult - would defer myeloma treatment for now until can respond to treatment of underlying infection  [facility] [phone]

## 2022-04-23 NOTE — Progress Notes (Signed)
Pharmacy Antibiotic Note  Nicholas Weiss is a 72 y.o. male admitted on 04/21/2022 with  foot infection and streptococcus bacteremia  .  Pharmacy has been consulted for ampicillin/sulbactam dosing. Patient currently receiving amoxicillin/clavulonate - tolerating with listed penicillin allergy which is unknown reaction as child. He has also been prescribed amoxicillin or amoxicillin/clavulanate several times in past.  Patient has left great toe wound and 2nd toe ulcer.  MRI revealed osteomyelitis of great toe and 2nd toe.  Plan for amputation by podiatry 1/25.  Today, 04/23/2022 Renal - SCr improved and CrCl now > 30  WBC WNL Afebrile 1/23 wound cx pending 1/23 blood cx GPC in one bottle each set, BCID = streptococcus species  Plan: Based on improved renal function and CrCl, start increase ampicillin/sulbactam to 3gm IV q6h Follow renal function Await blood and wound culture results  Height: '5\' 10"'$  (177.8 cm) Weight: 62.3 kg (137 lb 5.6 oz) IBW/kg (Calculated) : 73  Temp (24hrs), Avg:98.4 F (36.9 C), Min:98.1 F (36.7 C), Max:98.7 F (37.1 C)  Recent Labs  Lab 04/17/22 0943 04/20/22 2313 04/21/22 0057 04/21/22 0358 04/21/22 0444 04/21/22 0450 04/21/22 0530 04/21/22 1137 04/22/22 0415 04/23/22 0352  WBC 3.2* 5.9  --   --   --  5.6  --   --  5.0 2.9*  CREATININE 2.92* 3.88*  --   --  3.52*  --  3.35* 2.96* 2.44* 1.45*  LATICACIDVEN  --   --  4.8* 2.2*  --   --   --   --   --   --      Estimated Creatinine Clearance: 42.4 mL/min (A) (by C-G formula based on SCr of 1.45 mg/dL (H)).    Allergies  Allergen Reactions   Penicillins Other (See Comments)    Childhood allergy -tolerated amoxil 03/2022 Unknown reaction Has patient had a PCN reaction causing immediate rash, facial/tongue/throat swelling, SOB or lightheadedness with hypotension: No Has patient had a PCN reaction causing severe rash involving mucus membranes or skin necrosis: No Has patient had a PCN reaction that  required hospitalization No Has patient had a PCN reaction occurring within the last 10 years: No If all of the above answers are "NO", then may proceed with Cephalosporin use.     Antimicrobials this admission: 1/23 amox/clav >> 1/24 1/24 amp/sulb >>  Dose adjustments this admission:   Microbiology results: 1/23 wound cx pending 1/23 blood cx GPC in one bottle each set, BCID = streptococcus species  Thank you for allowing pharmacy to be a part of this patient's care.  Doreene Eland, PharmD, BCPS, BCIDP Work Cell: [phone] 04/23/2022 9:02 AM

## 2022-04-23 NOTE — Assessment & Plan Note (Signed)
Pt reports 24 lb weight loss in past month, very poor appetite.  Appreciate dietitian consultation and recommendations.

## 2022-04-23 NOTE — Progress Notes (Signed)
Initial Nutrition Assessment  DOCUMENTATION CODES:   Severe malnutrition in context of chronic illness  INTERVENTION:   -Downgrade diet to dysphagia 2 diet for ease of intake -MVI with minerals daily -500 mg vitamin C BID -220 mg zinc sulfate daily x 14 days -Ensure Enlive po TID, each supplement provides 350 kcal and 20 grams of protein -Mighty Shake TID, each supplement provides 220 kcals and 6 grams protein  NUTRITION DIAGNOSIS:   Severe Malnutrition related to chronic illness (DM, cocaine abuse) as evidenced by percent weight loss, moderate fat depletion, severe fat depletion, moderate muscle depletion, severe muscle depletion.  GOAL:   Patient will meet greater than or equal to 90% of their needs  MONITOR:   PO intake, Supplement acceptance  REASON FOR ASSESSMENT:   Consult Assessment of nutrition requirement/status, Wound healing  ASSESSMENT:   Pt with significant PMH of multiple myeloma, atrial fibrillation, T1DM, with recuirrent DKA admissions,  AV block first-degree, CKD stage III, CVA, chronic anemia with abnormal SPEP, HTN, HLD and cocaine abuse who presented with chief complaints of hyperglycemia.  Pt admitted with DKA and osteomyelitis of lt foot.   Reviewed I/O's: -1.2 L x 24 hours and -1.5 L since admission  UOP: 3.1 L x 24 hours   Per podiatry notes, initial plan for toe amputation, however, cancelled today due to positive UDS for cocaine.   Spoke with pt at bedside, who easily awoke from sleep at time of visit. Pt shares that he has had decreased oral intake for about a month due to not feeling well. Pt also explained that he had all of his teeth extracted about a month ago and has had difficult eating because of this. Pt unable to provide a diet recall, but reports he has not been eating much solid foods during this time and intake has mainly consisted of liquids, such as water, tea, and juice. RD discussed trialing a mechanically altered diet for ease of  intake; pt shares he had not considered this, but is willing to try.   Pt shares his UBW is around 168# and estimated a 30# wt loss over the past month. Reviewed wt hx; pt has experienced a 15.2% wt loss over the past month, which is significant for time frame.   Discussed importance of good meal and supplement intake to promote healing. Pt amenable to supplements.   Medications reviewed and include 0.9% sodium chloride infusion @ 75 ml/hr.   Lab Results  Component Value Date   HGBA1C 9.1 (H) 03/09/2022   PTA DM medications are 20 units insulin degludec daily.   Labs reviewed: CBGS: 11-127 (inpatient orders for glycemic control are 0-5 units insulin aspart daily at bedtime, 0-9 units insulin aspart TID with meals, and 16 units insulin detemir daily).    NUTRITION - FOCUSED PHYSICAL EXAM:  Flowsheet Row Most Recent Value  Orbital Region Moderate depletion  Upper Arm Region Severe depletion  Thoracic and Lumbar Region Severe depletion  Buccal Region Moderate depletion  Temple Region Severe depletion  Clavicle Bone Region Severe depletion  Clavicle and Acromion Bone Region Severe depletion  Scapular Bone Region Severe depletion  Dorsal Hand Severe depletion  Patellar Region Severe depletion  Anterior Thigh Region Severe depletion  Posterior Calf Region Severe depletion  Edema (RD Assessment) None  Hair Reviewed  Eyes Reviewed  Mouth Reviewed  Skin Reviewed  Nails Reviewed       Diet Order:   Diet Order  Diet NPO time specified  Diet effective midnight           DIET DYS 2 Room service appropriate? Yes; Fluid consistency: Thin  Diet effective now                   EDUCATION NEEDS:   Education needs have been addressed  Skin:  Skin Assessment: Skin Integrity Issues: Skin Integrity Issues:: Other (Comment) Other: osteomyelitis to 1st and 2nd lt toes  Last BM:  04/20/22  Height:   Ht Readings from Last 1 Encounters:  04/20/22 '5\' 10"'$  (1.778 m)     Weight:   Wt Readings from Last 1 Encounters:  04/23/22 62.3 kg    Ideal Body Weight:  75.5 kg  BMI:  Body mass index is 19.71 kg/m.  Estimated Nutritional Needs:   Kcal:  2150-2350  Protein:  125-150 grams  Fluid:  > 2 L    Loistine Chance, RD, LDN, [place] Registered Dietitian II Certified Diabetes Care and Education Specialist Please refer to [facility] for RD and/or RD on-call/weekend/after hours pager

## 2022-04-23 NOTE — Progress Notes (Signed)
Progress Note   Patient: Nicholas Weiss [id number]:[id number] DOB: March 04, 1951 DOA: 04/21/2022     2 DOS: the patient was seen and examined on 04/23/2022   Brief hospital course: HPI on admission, from ICU H&P: "72 y.o with significant PMH of multiple myeloma, atrial fibrillation, T1DM, with recuirrent DKA admissions,  AV block first-degree, CKD stage III, CVA, chronic anemia with abnormal SPEP, HTN, HLD and cocaine abuse who presented to the ED with chief complaints of hyperglycemia.    Patient report he has been feeling unwell x 1 week and was unable to tolerate po intake or take his medications. He initially reported hypoglycemia however today he complained of nausea and vomiting . He was recently discharged on 0/11/2022 following admission for DKA and COVID treated with Paxlovid.   ED Course: Initial vital signs showed HR of 110 beats/minute, BP 105/50 mm Hg, the RR 16 breaths/minute, and the oxygen saturation 100 % on RA and a temperature of 97.26F (36.5C). Pertinent Labs/Diagnostics Findings: Chemistry:Na+/ K+: 137/7.5 Glucose 897: BUN/Cr.  89/3.88, CO2 8, Anion gap 30 CBC: WBC: 5.9 Hgb/Hct: 10.5/38.0  Other Lab findings: Beta hydroxybutyrate>8.0, Lactic acid: 4.8 COVID PCR: positive on 04/07/22 Venous Blood Gas result:  pO2 51; pCO2 34; pH 7.02;  HCO3 8.8, %O2 Sat 58.1.    The laboratory data showed consistent with patient's well-known recurrent DKA. Urinalysis and chest xray demonstrated no evidence of infection.  Given ECG changes and severe AKI, the management was initiated with initial intravenous fluid, hyperkalemia correction with IV calcium gluconate, and intravenous (IV) bicarbonate push followed by intravenous insulin bolus and infusion as per DKA protocol. Serum electrolytes were closely monitored. PCCM consulted to admit for further management of DKA."   1/24: TRH assumed care.  Pt off insulin drip.  Seen by podiatry for left foot osteomyelitis.  They plan for amputation this admission.  Blood cultures also positive for GPC's, suspect foot is the source.  Changed Augmentin to Unasyn.  1/25: left great toe amputation planned but cancelled by anesthesia due to cocaine+.  ID consulted.  Continuing Unasyn.  Assessment and Plan: * DKA (diabetic ketoacidosis) ([place]) POA, resolved. Continue Levemir and Novolog Increase Levemir 10 >> 16 units daily Adjust for goal 140-180 Hypoglycemia protocol.  Osteomyelitis of left foot [facility]) Podiatry following, planning for amputation. Augmentin changed to Unasyn given concurrent GPC Bacteremia. Follow blood cultures and obtain repeats tomorrow Appreciate Infectious Disease input. Echo (TTE) pending. Follow operative cultures once obtained.  Sepsis Ruled Out.  Pt did not meet 2+ SIRS criteria, only tachycardia.  Lactic acidosis due to dehydration with DKA.  Acute renal failure superimposed on stage 3a chronic kidney disease ([place]) Cr on admission 2.96. Cr today 2.44 >> 1.45 Continue IV hydration. Likely pre-renal azotemia.  Hyperkalemia Resolved.  Anemia of chronic disease Hbg stable. Monitor CBC.  Essential (primary) hypertension ACEI/ARC held due to AKI. Continue amlodipine. PRN IV hydralazine  Type 1 diabetes mellitus with diabetic neuropathy, unspecified ([place]) See DKA  History of substance abuse ([place]) UDS positive for cocaine and cannabis. Counseled on importance of avoiding substances, these likely contribute to DKA. Repeat UDS tomorrow AM (1/26). Surgery cancelled until negative for cocaine.  History of CVA (cerebrovascular accident) Continue statin Hold ASA, Plavix for surgery  Dyslipidemia Continue statin  Multiple myeloma ([place]) -Currently on Revlimid -Follows with oncology  Weight loss Pt reports 24 lb weight loss in past month, very poor appetite.  Appreciate dietitian consultation and recommendations.        Subjective: Pt seen  this AM. He reports feeling well and asks when he can go home after  surgery.  Girlfriend arrived to bedsdie, updated and questions answered.  Pt asking when he can eat. They both express concern over his poor appetite and weight loss recently.   Physical Exam: Vitals:   04/23/22 0401 04/23/22 0448 04/23/22 0829 04/23/22 1123  BP: (!) 139/95  (!) 157/94 110/81  Pulse: 87  88 85  Resp: '20  20 20  '$ Temp: 98.7 F (37.1 C)  98.2 F (36.8 C) 98 F (36.7 C)  TempSrc:   Oral   SpO2: 100%  100% 100%  Weight:  62.3 kg    Height:       General exam: awake, alert, no acute distress HEENT: moist mucus membranes, hearing grossly normal  Respiratory system: CTAB but generally diminished, no wheezes, rales or rhonchi, normal respiratory effort. Cardiovascular system: normal S1/S2, RRR, no JVD, murmurs, rubs, gallops, no pedal edema.   Gastrointestinal system: soft, NT, ND, no HSM felt, +bowel sounds. Central nervous system: A&O x3. no gross focal neurologic deficits, normal speech Extremities: left foot with clean dry intact dressing in place, no edema, normal tone Skin: dry, intact, normal temperature, normal color Psychiatry: normal mood, congruent affect, judgement and insight appear normal   Data Reviewed:  Notable labs -- Cr 1.45 from 2.44, glucose 349 (CBG's 329 >> 310 >> 383 >> 252), albumin 3.1, WBC 2.9, Hbg 10.2 stable. Repeat UDS still positive for cocaine and cannabis.  Family Communication: girlfriend at bedside on rounds today  Disposition: Status is: Inpatient Remains inpatient appropriate because: Severity of illness remaining on IV fluids for AKI and IV antibiotics for bacteremia secondary to osteomyelitis with surgery planned.    Planned Discharge Destination: Home    Time spent: 40 minutes  Author: Ezekiel Slocumb, DO 04/23/2022 2:22 PM  For on call review [email or web address].

## 2022-04-24 ENCOUNTER — Ambulatory Visit: Payer: Medicare Other | Admitting: Oncology

## 2022-04-24 ENCOUNTER — Inpatient Hospital Stay (HOSPITAL_COMMUNITY)
Admit: 2022-04-24 | Discharge: 2022-04-24 | Disposition: A | Discharge status: Home / Self Care | Payer: 59 | Attending: Internal Medicine | Admitting: Internal Medicine

## 2022-04-24 ENCOUNTER — Encounter
Admission: EM | Disposition: A | Discharge status: Home / Self Care | Payer: Self-pay | Source: Home / Self Care | Attending: Internal Medicine

## 2022-04-24 ENCOUNTER — Inpatient Hospital Stay: Payer: 59

## 2022-04-24 ENCOUNTER — Ambulatory Visit: Payer: Medicare Other

## 2022-04-24 ENCOUNTER — Other Ambulatory Visit: Payer: Medicare Other

## 2022-04-24 DIAGNOSIS — E08621 Diabetes mellitus due to underlying condition with foot ulcer: Secondary | ICD-10-CM | POA: Diagnosis present

## 2022-04-24 DIAGNOSIS — L97509 Non-pressure chronic ulcer of other part of unspecified foot with unspecified severity: Secondary | ICD-10-CM

## 2022-04-24 DIAGNOSIS — D72819 Decreased white blood cell count, unspecified: Secondary | ICD-10-CM | POA: Diagnosis not present

## 2022-04-24 DIAGNOSIS — M869 Osteomyelitis, unspecified: Secondary | ICD-10-CM | POA: Diagnosis not present

## 2022-04-24 DIAGNOSIS — E1169 Type 2 diabetes mellitus with other specified complication: Secondary | ICD-10-CM | POA: Diagnosis not present

## 2022-04-24 DIAGNOSIS — Z8579 Personal history of other malignant neoplasms of lymphoid, hematopoietic and related tissues: Secondary | ICD-10-CM

## 2022-04-24 DIAGNOSIS — B955 Unspecified streptococcus as the cause of diseases classified elsewhere: Secondary | ICD-10-CM | POA: Diagnosis present

## 2022-04-24 DIAGNOSIS — N179 Acute kidney failure, unspecified: Secondary | ICD-10-CM | POA: Diagnosis not present

## 2022-04-24 DIAGNOSIS — E162 Hypoglycemia, unspecified: Secondary | ICD-10-CM | POA: Diagnosis not present

## 2022-04-24 DIAGNOSIS — I951 Orthostatic hypotension: Secondary | ICD-10-CM | POA: Diagnosis not present

## 2022-04-24 DIAGNOSIS — R7881 Bacteremia: Secondary | ICD-10-CM

## 2022-04-24 DIAGNOSIS — E43 Unspecified severe protein-calorie malnutrition: Secondary | ICD-10-CM | POA: Diagnosis present

## 2022-04-24 LAB — GLUCOSE, CAPILLARY
Glucose-Capillary: 103 mg/dL — ABNORMAL HIGH (ref 70–99)
Glucose-Capillary: 109 mg/dL — ABNORMAL HIGH (ref 70–99)
Glucose-Capillary: 124 mg/dL — ABNORMAL HIGH (ref 70–99)
Glucose-Capillary: 153 mg/dL — ABNORMAL HIGH (ref 70–99)
Glucose-Capillary: 185 mg/dL — ABNORMAL HIGH (ref 70–99)
Glucose-Capillary: 202 mg/dL — ABNORMAL HIGH (ref 70–99)
Glucose-Capillary: 31 mg/dL — CL (ref 70–99)
Glucose-Capillary: 33 mg/dL — CL (ref 70–99)
Glucose-Capillary: 35 mg/dL — CL (ref 70–99)
Glucose-Capillary: 59 mg/dL — ABNORMAL LOW (ref 70–99)
Glucose-Capillary: 62 mg/dL — ABNORMAL LOW (ref 70–99)
Glucose-Capillary: 68 mg/dL — ABNORMAL LOW (ref 70–99)
Glucose-Capillary: 69 mg/dL — ABNORMAL LOW (ref 70–99)
Glucose-Capillary: 90 mg/dL (ref 70–99)

## 2022-04-24 LAB — URINE DRUG SCREEN, QUALITATIVE (ARMC ONLY)
Amphetamines, Ur Screen: NOT DETECTED
Barbiturates, Ur Screen: NOT DETECTED
Benzodiazepine, Ur Scrn: NOT DETECTED
Cannabinoid 50 Ng, Ur ~~LOC~~: POSITIVE — AB
Cocaine Metabolite,Ur ~~LOC~~: POSITIVE — AB
MDMA (Ecstasy)Ur Screen: NOT DETECTED
Methadone Scn, Ur: NOT DETECTED
Opiate, Ur Screen: NOT DETECTED
Phencyclidine (PCP) Ur S: NOT DETECTED
Tricyclic, Ur Screen: NOT DETECTED

## 2022-04-24 LAB — CBC
HCT: 28.3 % — ABNORMAL LOW (ref 39.0–52.0)
Hemoglobin: 8.9 g/dL — ABNORMAL LOW (ref 13.0–17.0)
MCH: 29.3 pg (ref 26.0–34.0)
MCHC: 31.4 g/dL (ref 30.0–36.0)
MCV: 93.1 fL (ref 80.0–100.0)
Platelets: 183 10*3/uL (ref 150–400)
RBC: 3.04 MIL/uL — ABNORMAL LOW (ref 4.22–5.81)
RDW: 16.3 % — ABNORMAL HIGH (ref 11.5–15.5)
WBC: 2.2 10*3/uL — ABNORMAL LOW (ref 4.0–10.5)
nRBC: 0 % (ref 0.0–0.2)

## 2022-04-24 LAB — CULTURE, BLOOD (ROUTINE X 2): Special Requests: ADEQUATE

## 2022-04-24 LAB — RENAL FUNCTION PANEL
Albumin: 2.5 g/dL — ABNORMAL LOW (ref 3.5–5.0)
Anion gap: 7 (ref 5–15)
BUN: 42 mg/dL — ABNORMAL HIGH (ref 8–23)
CO2: 25 mmol/L (ref 22–32)
Calcium: 8.2 mg/dL — ABNORMAL LOW (ref 8.9–10.3)
Chloride: 111 mmol/L (ref 98–111)
Creatinine, Ser: 1.37 mg/dL — ABNORMAL HIGH (ref 0.61–1.24)
GFR, Estimated: 56 mL/min — ABNORMAL LOW (ref >60)
Glucose, Bld: 93 mg/dL (ref 70–99)
Phosphorus: 2.7 mg/dL (ref 2.5–4.6)
Potassium: 3.9 mmol/L (ref 3.5–5.1)
Sodium: 143 mmol/L (ref 135–145)

## 2022-04-24 LAB — ECHOCARDIOGRAM COMPLETE
AR max vel: 2.55 cm2
AV Area VTI: 2.66 cm2
AV Area mean vel: 2.34 cm2
AV Mean grad: 2 mmHg
AV Peak grad: 3.6 mmHg
Ao pk vel: 0.95 m/s
Area-P 1/2: 8.82 cm2
MV VTI: 1.97 cm2
S' Lateral: 2.8 cm
Weight: 2243.4 oz

## 2022-04-24 SURGERY — AMPUTATION, TOE
Anesthesia: Choice | Site: Toe | Laterality: Left

## 2022-04-24 MED ORDER — ACYCLOVIR 200 MG PO CAPS
400.0000 mg | ORAL_CAPSULE | Freq: Two times a day (BID) | ORAL | Status: DC
  Administered 2022-04-24 – 2022-04-29 (×11): 400 mg via ORAL
  Filled 2022-04-24 (×13): qty 2

## 2022-04-24 MED ORDER — DEXTROSE-NACL 5-0.9 % IV SOLN: INTRAVENOUS | Status: DC

## 2022-04-24 MED ORDER — VANCOMYCIN HCL 1500 MG/300ML IV SOLN
1500.0000 mg | Freq: Once | INTRAVENOUS | Status: AC
  Administered 2022-04-24: 1500 mg via INTRAVENOUS
  Filled 2022-04-24: qty 300

## 2022-04-24 MED ORDER — DEXTROSE 50 % IV SOLN
25.0000 g | INTRAVENOUS | Status: AC
  Administered 2022-04-24: 25 g via INTRAVENOUS
  Filled 2022-04-24: qty 50

## 2022-04-24 MED ORDER — VANCOMYCIN HCL IN DEXTROSE 1-5 GM/200ML-% IV SOLN: 1000.0000 mg | INTRAVENOUS | Status: DC

## 2022-04-24 MED ORDER — DEXTROSE 10 % IV SOLN: INTRAVENOUS | Status: DC

## 2022-04-24 MED ORDER — INSULIN DETEMIR 100 UNIT/ML ~~LOC~~ SOLN
8.0000 [IU] | Freq: Every day | SUBCUTANEOUS | Status: DC
  Administered 2022-04-24 – 2022-04-28 (×5): 8 [IU] via SUBCUTANEOUS
  Filled 2022-04-24 (×5): qty 0.08

## 2022-04-24 MED ORDER — SODIUM CHLORIDE 0.9 % IV BOLUS
500.0000 mL | Freq: Once | INTRAVENOUS | Status: AC
  Administered 2022-04-24: 500 mL via INTRAVENOUS

## 2022-04-24 SURGICAL SUPPLY — 43 items
BAG COUNTER SPONGE SURGICOUNT (BAG) IMPLANT
BASIN KIT SINGLE STR (MISCELLANEOUS) ×1 IMPLANT
BLADE OSC/SAGITTAL MD 5.5X18 (BLADE) IMPLANT
BLADE SURG 15 STRL LF DISP TIS (BLADE) ×1 IMPLANT
BLADE SURG 15 STRL SS (BLADE) ×1
BLADE SURG MINI STRL (BLADE) ×1 IMPLANT
BNDG COHESIVE 4X5 TAN STRL LF (GAUZE/BANDAGES/DRESSINGS) ×1 IMPLANT
BNDG ELASTIC 4X5.8 VLCR STR LF (GAUZE/BANDAGES/DRESSINGS) ×1 IMPLANT
BNDG ESMARCH 4 X 12 STRL LF (GAUZE/BANDAGES/DRESSINGS) ×1
BNDG ESMARCH 4X12 STRL LF (GAUZE/BANDAGES/DRESSINGS) ×1 IMPLANT
BNDG GAUZE DERMACEA FLUFF 4 (GAUZE/BANDAGES/DRESSINGS) ×1 IMPLANT
COVER LIGHT HANDLE STERIS (MISCELLANEOUS) ×2 IMPLANT
CUFF TOURN SGL QUICK 18X4 (TOURNIQUET CUFF) ×1 IMPLANT
DRAPE FLUOR MINI C-ARM 54X84 (DRAPES) ×1 IMPLANT
DURAPREP 26ML APPLICATOR (WOUND CARE) ×1 IMPLANT
ELECT REM PT RETURN 9FT ADLT (ELECTROSURGICAL) ×1
ELECTRODE REM PT RTRN 9FT ADLT (ELECTROSURGICAL) ×1 IMPLANT
GAUZE PAD ABD 8X10 STRL (GAUZE/BANDAGES/DRESSINGS) ×1 IMPLANT
GAUZE SPONGE 4X4 12PLY STRL (GAUZE/BANDAGES/DRESSINGS) IMPLANT
GLOVE BIO SURGEON STRL SZ7 (GLOVE) ×1 IMPLANT
GLOVE SURG UNDER POLY LF SZ7.5 (GLOVE) ×1 IMPLANT
GOWN STRL REUS W/ TWL LRG LVL3 (GOWN DISPOSABLE) ×2 IMPLANT
GOWN STRL REUS W/ TWL XL LVL3 (GOWN DISPOSABLE) ×1 IMPLANT
GOWN STRL REUS W/TWL LRG LVL3 (GOWN DISPOSABLE) ×2
GOWN STRL REUS W/TWL XL LVL3 (GOWN DISPOSABLE) ×1
HANDPIECE INTERPULSE COAX TIP (DISPOSABLE) ×1
HANDPIECE VERSAJET DEBRIDEMENT (MISCELLANEOUS) IMPLANT
IV NS 1000ML (IV SOLUTION) ×1
IV NS 1000ML BAXH (IV SOLUTION) ×1 IMPLANT
KIT TURNOVER KIT A (KITS) IMPLANT
MANIFOLD NEPTUNE II (INSTRUMENTS) ×1 IMPLANT
NEEDLE HYPO 22GX1.5 SAFETY (NEEDLE) IMPLANT
NS IRRIG 1000ML POUR BTL (IV SOLUTION) ×1 IMPLANT
PACK DRAINAGE HVY KERLIX W/SPG (MISCELLANEOUS) ×1 IMPLANT
PACK EXTREMITY ARMC (MISCELLANEOUS) ×1 IMPLANT
PAD ARMBOARD 7.5X6 YLW CONV (MISCELLANEOUS) ×2 IMPLANT
SET HNDPC FAN SPRY TIP SCT (DISPOSABLE) ×1 IMPLANT
STAPLER SKIN PROX 35W (STAPLE) ×1 IMPLANT
SUT PROLENE 2 0 FS (SUTURE) ×1 IMPLANT
SUT PROLENE 3 0 PS 2 (SUTURE) ×1 IMPLANT
SWAB CULTURE AMIES ANAERIB BLU (MISCELLANEOUS) IMPLANT
SYR CONTROL 10ML LL (SYRINGE) IMPLANT
TRAP FLUID SMOKE EVACUATOR (MISCELLANEOUS) ×1 IMPLANT

## 2022-04-24 NOTE — Assessment & Plan Note (Addendum)
With NPO status for potential surgery, patient has been having hypoglycemic episodes.  He required D5-NS at 125/hr followed by D10 on AM of 1/26 --Required dextrose fluids due to recurrent hypoglycemic episodes with NPO perioperatively --Continue close monitoring --Hypoglycemia protocol

## 2022-04-24 NOTE — Progress Notes (Addendum)
Progress Note   Patient: Nicholas Weiss [id number]:[id number] DOB: 08-Jun-1950 DOA: 04/21/2022     3 DOS: the patient was seen and examined on 04/24/2022   Brief hospital course: HPI on admission, from ICU H&P: "72 y.o with significant PMH of multiple myeloma, atrial fibrillation, T1DM, with recuirrent DKA admissions,  AV block first-degree, CKD stage III, CVA, chronic anemia with abnormal SPEP, HTN, HLD and cocaine abuse who presented to the ED with chief complaints of hyperglycemia.    Patient report he has been feeling unwell x 1 week and was unable to tolerate po intake or take his medications. He initially reported hypoglycemia however today he complained of nausea and vomiting . He was recently discharged on 0/11/2022 following admission for DKA and COVID treated with Paxlovid.   ED Course: Initial vital signs showed HR of 110 beats/minute, BP 105/50 mm Hg, the RR 16 breaths/minute, and the oxygen saturation 100 % on RA and a temperature of 97.62F (36.5C). Pertinent Labs/Diagnostics Findings: Chemistry:Na+/ K+: 137/7.5 Glucose 897: BUN/Cr.  89/3.88, CO2 8, Anion gap 30 CBC: WBC: 5.9 Hgb/Hct: 10.5/38.0  Other Lab findings: Beta hydroxybutyrate>8.0, Lactic acid: 4.8 COVID PCR: positive on 04/07/22 Venous Blood Gas result:  pO2 51; pCO2 34; pH 7.02;  HCO3 8.8, %O2 Sat 58.1.    The laboratory data showed consistent with patient's well-known recurrent DKA. Urinalysis and chest xray demonstrated no evidence of infection.  Given ECG changes and severe AKI, the management was initiated with initial intravenous fluid, hyperkalemia correction with IV calcium gluconate, and intravenous (IV) bicarbonate push followed by intravenous insulin bolus and infusion as per DKA protocol. Serum electrolytes were closely monitored. PCCM consulted to admit for further management of DKA."  1/24: TRH assumed care.  Pt off insulin drip.  Seen by podiatry for left foot osteomyelitis.  They plan for amputation this admission.  Blood cultures also positive for GPC's, suspect foot is the source.  Changed Augmentin to Unasyn. 1/25: left great toe amputation planned but cancelled by anesthesia due to cocaine+.  ID consulted.  Continuing Unasyn. 1/26: hypoglemic episodes requiring dextrose fluids. Surgery cancelled again, stlil cocain positive UDS this AM.  New symptomatic orthostatic hypotension afternoon, 500 cc bolus and resume maintenance fluids with D5-NS  Assessment and Plan: * DKA (diabetic ketoacidosis) ([facility]) POA, resolved. Continue Levemir and Novolog Increase Levemir 10 >> 16 units daily Adjust for goal 140-180 Hypoglycemia protocol.  Streptococcal bacteremia See osteomyelitis. Appreciate ID's assistance.  Osteomyelitis of left foot [facility]) Podiatry following, planning for amputation. Surgery cancelled 1/25, 1/26 as UDS remains positive for cocaine. Augmentin changed to Unasyn given concurrent Streptococcal Bacteremia. Follow blood cultures and obtain repeats tomorrow Appreciate Infectious Disease input. Echo (TTE) pending. Follow operative cultures once obtained.  Sepsis Ruled Out.  Pt did not meet 2+ SIRS criteria, only tachycardia.  Lactic acidosis due to dehydration with DKA.  Hypoglycemia With NPO status for potential surgery, patient has been having hypoglycemic episodes.  He required D5-NS at 125/hr followed by D10 this AM (1/26). --Diet resumed since surgery again delayed --Will resume D10 at midnight when he's NPO again --Continue close monitoring --Hypoglycemia protocol  Acute renal failure superimposed on stage 3a chronic kidney disease ([place]) Cr on admission 2.96. Cr today 2.44 >> 1.45 >> 1.37 Improved with IV hydration. Likely pre-renal azotemia.  Hyperkalemia Resolved.  Anemia of chronic disease Hbg stable. Monitor CBC.  Essential (primary) hypertension ACEI/ARC held due to AKI. Continue amlodipine. PRN IV hydralazine  Type 1 diabetes mellitus with diabetic neuropathy,  unspecified ([place]) See DKA  History of substance abuse ([place]) UDS positive for cocaine and cannabis. Counseled on importance of avoiding substances, these likely contribute to DKA. Repeat UDS tomorrow AM (1/26). Surgery cancelled until negative for cocaine.  History of CVA (cerebrovascular accident) Continue statin Hold ASA, Plavix for surgery  Dyslipidemia Continue statin  Multiple myeloma ([place]) -Currently on Revlimid -Follows with oncology -On acyclovir for prophylaxis, needs refills sent at d/c  Sympathotonic orthostatic hypotension Pt BP dropped to 68/48 today (1/26) when standing while bed linens were changed. --500 cc bolus NS --D5-NS @ 75 cc/hr after bolus --Monitor closely --Fall precautions  Weight loss Pt reports 24 lb weight loss in past month, very poor appetite.  Appreciate dietitian consultation and recommendations.        Subjective: Pt seen this AM. He was sleeping but woke easily.  Denies any acute complaints. Reports he feels okay.  Says last cocaine use was a week ago Tuesday (so a week and a half ago).  Bedside RN reports he has only been sleeping and drowsy, has not shown any signs of him taking any stimulants here.   Physical Exam: Vitals:   04/24/22 1221 04/24/22 1502 04/24/22 1504 04/24/22 1508  BP: 94/62 (!) 68/48 101/70 110/75  Pulse: 79 85 73   Resp: 18     Temp: 98.5 F (36.9 C)     TempSrc: Oral     SpO2: 100%     Weight:      Height:       General exam: sleeping, woke easily to voice, no acute distress HEENT: moist mucus membranes, hearing grossly normal  Respiratory system: on room air, normal respiratory effort. Cardiovascular system: normal S1/S2, RRR, no JVD, murmurs, rubs, gallops, no pedal edema.   Gastrointestinal system: soft, NT, ND, no HSM felt, +bowel sounds. Central nervous system: A&O x3. no gross focal neurologic deficits, normal speech Extremities: left foot with clean dry intact dressing in place, no edema, normal  tone Skin: dry, intact, normal temperature, normal color Psychiatry: normal mood, congruent affect, judgement and insight appear normal   Data Reviewed:  Notable labs -- Cr 1.37, BUN 42, WBC 2.2, Hbg 8.9.  Repeat UDS again remains positive for cocaine and cannabis.  Echo (TTE) - showed EF 82-70%, grade 1 diastolic dysfunction, mild LVH of basal/septal segment, no significant valvular disease   Family Communication: girlfriend at bedside on rounds 1/25  Disposition: Status is: Inpatient Remains inpatient appropriate because: Severity of illness remaining on IV therapies, recurrent hypoglycemic episodes and IV antibiotics for bacteremia secondary to osteomyelitis with surgery planned.    Planned Discharge Destination: Home    Time spent: 40 minutes  Author: Ezekiel Slocumb, DO 04/24/2022 3:13 PM  For on call review [email or web address].

## 2022-04-24 NOTE — Assessment & Plan Note (Signed)
See osteomyelitis. Appreciate ID's assistance.

## 2022-04-24 NOTE — Progress Notes (Signed)
[facility]    Date of Admission:  04/21/2022   Total days of antibiotics 4/day 3 of amp/sub   ID: Nicholas Weiss is a 72 y.o. male with  streptococcal bacteremia with DFU/osteomyelitis of left foot Principal Problem:   DKA (diabetic ketoacidosis) ([place]) Active Problems:   Type 1 diabetes mellitus with diabetic neuropathy, unspecified ([place])   Essential (primary) hypertension   Acute renal failure superimposed on stage 3a chronic kidney disease (HCC)   Multiple myeloma (HCC)   Anemia of chronic disease   Dyslipidemia   History of CVA (cerebrovascular accident)   History of substance abuse ([place])   Hyperkalemia   Osteomyelitis of left foot ([place])   DKA, type 1 (HCC)   Weight loss   Protein-calorie malnutrition, severe    Subjective: Afebrile, although having episodes of hypoglycemia requiring dextrose injection. Surgery postponed due to +cocaine in uds  Medications:   acyclovir  400 mg Oral BID   amLODipine  10 mg Oral Daily   vitamin C  500 mg Oral BID   feeding supplement  237 mL Oral TID BM   heparin  5,000 Units Subcutaneous Q8H   insulin aspart  0-5 Units Subcutaneous QHS   insulin aspart  0-9 Units Subcutaneous TID WC   insulin detemir  8 Units Subcutaneous Daily   multivitamin with minerals  1 tablet Oral Daily   zinc sulfate  220 mg Oral Daily    Objective: Vital signs in last 24 hours: Temp:  [97.5 F (36.4 C)-98.8 F (37.1 C)] 98.5 F (36.9 C) (01/26 1221) Pulse Rate:  [70-87] 79 (01/26 1221) Resp:  [18-20] 18 (01/26 1221) BP: (94-141)/(62-78) 94/62 (01/26 1221) SpO2:  [99 %-100 %] 100 % (01/26 1221) Weight:  [63.6 kg] 63.6 kg (01/26 0139)  Physical Exam  Constitutional: He is oriented to person, place, and time. He appears well-developed and well-nourished. No distress.  HENT:  Mouth/Throat: Oropharynx is clear and moist. No oropharyngeal exudate.  Cardiovascular: Normal rate, regular rhythm and normal heart sounds. Exam  reveals no gallop and no friction rub.  No murmur heard.  Pulmonary/Chest: Effort normal and breath sounds normal. No respiratory distress. He has no wheezes.  Abdominal: Soft. Bowel sounds are normal. He exhibits no distension. There is no tenderness.  Lymphadenopathy:  He has no cervical adenopathy.  DQQ:IWLN foot great toe and 2nd digit ulcer unchanged from admit photos Neurological: He is alert and oriented to person, place, and time.  Skin: Skin is warm and dry. No rash noted. No erythema.  Psychiatric: He has a normal mood and affect. His behavior is normal.    Lab Results Recent Labs    04/23/22 0352 04/24/22 0640  WBC 2.9* 2.2*  HGB 10.2* 8.9*  HCT 32.5* 28.3*  NA 141 143  K 4.3 3.9  CL 108 111  CO2 23 25  BUN 46* 42*  CREATININE 1.45* 1.37*   Liver Panel Recent Labs    04/23/22 0352 04/24/22 0640  ALBUMIN 3.1* 2.5*   Sedimentation Rate No results for input(s): "ESRSEDRATE" in the last 72 hours. C-Reactive Protein No results for input(s): "CRP" in the last 72 hours.  Microbiology: 1/22 blood cx - streptococcus parasanguinus Streptococcus parasanguinis      MIC    CEFTRIAXONE <=0.12 SENS... Sensitive    LEVOFLOXACIN 2 SENSITIVE Sensitive    PENICILLIN 0.12 SENSIT... Sensitive    VANCOMYCIN 0.5 SENSITIVE Sensitive    Left foot wound = group b strep and enterococcaus  faecalis Studies/Results: ECHOCARDIOGRAM COMPLETE  Result Date: 04/24/2022    ECHOCARDIOGRAM REPORT   Patient Name:   Nicholas Weiss Date of Exam: 04/24/2022 Medical Rec #:  [id number]       Height:       70.0 in Accession #:    [id number]      Weight:       140.2 lb Date of Birth:  [birth year]      BSA:          1.795 m Patient Age:    15 years        BP:           141/68 mmHg Patient Gender: M               HR:           77 bpm. Exam Location:  [facility] Procedure: 2D Echo, Cardiac Doppler and Color Doppler Indications:     Bacteremia  History:         Patient has prior history of Echocardiogram  examinations, most                  recent 11/27/2021. Stroke, Arrythmias:Atrial Fibrillation,                  Signs/Symptoms:Syncope and Bacteremia; Risk                  Factors:Hypertension, Diabetes and Dyslipidemia. Multiple                  Myeloma, Substance abuse.  Sonographer:     Wenda Low Referring Phys:  [id number] Claiborne Billings A GRIFFITH Diagnosing Phys: Ida Rogue MD IMPRESSIONS  1. Left ventricular ejection fraction, by estimation, is 55 to 60%. The left ventricle has normal function. The left ventricle has no regional wall motion abnormalities. There is mild asymmetric left ventricular hypertrophy of the basal-septal segment. Left ventricular diastolic parameters are consistent with Grade I diastolic dysfunction (impaired relaxation).  2. Right ventricular systolic function is normal. The right ventricular size is normal. There is normal pulmonary artery systolic pressure.  3. The mitral valve is normal in structure. No evidence of mitral valve regurgitation. No evidence of mitral stenosis.  4. The aortic valve has an indeterminant number of cusps. Aortic valve regurgitation is not visualized. Aortic valve sclerosis/calcification is present, without any evidence of aortic stenosis.  5. The inferior vena cava is normal in size with greater than 50% respiratory variability, suggesting right atrial pressure of 3 mmHg. FINDINGS  Left Ventricle: Left ventricular ejection fraction, by estimation, is 55 to 60%. The left ventricle has normal function. The left ventricle has no regional wall motion abnormalities. The left ventricular internal cavity size was normal in size. There is  mild asymmetric left ventricular hypertrophy of the basal-septal segment. Left ventricular diastolic parameters are consistent with Grade I diastolic dysfunction (impaired relaxation). Right Ventricle: The right ventricular size is normal. No increase in right ventricular wall thickness. Right ventricular systolic function is  normal. There is normal pulmonary artery systolic pressure. The tricuspid regurgitant velocity is 1.79 m/s, and  with an assumed right atrial pressure of 3 mmHg, the estimated right ventricular systolic pressure is 16.9 mmHg. Left Atrium: Left atrial size was normal in size. Right Atrium: Right atrial size was normal in size. Pericardium: There is no evidence of pericardial effusion. Mitral Valve: The mitral valve is normal in structure. There is mild calcification of the mitral valve leaflet(s). No evidence of mitral valve  regurgitation. No evidence of mitral valve stenosis. MV peak gradient, 5.5 mmHg. The mean mitral valve gradient  is 2.0 mmHg. Tricuspid Valve: The tricuspid valve is normal in structure. Tricuspid valve regurgitation is mild . No evidence of tricuspid stenosis. Aortic Valve: The aortic valve has an indeterminant number of cusps. Aortic valve regurgitation is not visualized. Aortic valve sclerosis/calcification is present, without any evidence of aortic stenosis. Aortic valve mean gradient measures 2.0 mmHg. Aortic valve peak gradient measures 3.6 mmHg. Aortic valve area, by VTI measures 2.66 cm. Pulmonic Valve: The pulmonic valve was normal in structure. Pulmonic valve regurgitation is not visualized. No evidence of pulmonic stenosis. Aorta: The aortic root is normal in size and structure. Venous: The inferior vena cava is normal in size with greater than 50% respiratory variability, suggesting right atrial pressure of 3 mmHg. IAS/Shunts: No atrial level shunt detected by color flow Doppler.  LEFT VENTRICLE PLAX 2D LVIDd:         4.40 cm   Diastology LVIDs:         2.80 cm   LV e' medial:    5.55 cm/s LV PW:         1.20 cm   LV E/e' medial:  9.0 LV IVS:        1.00 cm   LV e' lateral:   11.60 cm/s LVOT diam:     2.00 cm   LV E/e' lateral: 4.3 LV SV:         48 LV SV Index:   27 LVOT Area:     3.14 cm  RIGHT VENTRICLE RV Basal diam:  3.25 cm RV Mid diam:    3.00 cm RV S prime:     12.60 cm/s  TAPSE (M-mode): 2.6 cm LEFT ATRIUM             Index        RIGHT ATRIUM           Index LA diam:        3.50 cm 1.95 cm/m   RA Area:     16.30 cm LA Vol (A2C):   67.5 ml 37.61 ml/m  RA Volume:   45.00 ml  25.07 ml/m LA Vol (A4C):   44.6 ml 24.85 ml/m LA Biplane Vol: 56.0 ml 31.20 ml/m  AORTIC VALVE                    PULMONIC VALVE AV Area (Vmax):    2.55 cm     PV Vmax:       0.90 m/s AV Area (Vmean):   2.34 cm     PV Peak grad:  3.3 mmHg AV Area (VTI):     2.66 cm AV Vmax:           94.50 cm/s AV Vmean:          64.500 cm/s AV VTI:            0.181 m AV Peak Grad:      3.6 mmHg AV Mean Grad:      2.0 mmHg LVOT Vmax:         76.60 cm/s LVOT Vmean:        48.100 cm/s LVOT VTI:          0.153 m LVOT/AV VTI ratio: 0.85  AORTA Ao Root diam: 3.60 cm MITRAL VALVE               TRICUSPID VALVE MV Area (PHT): 8.82 cm  TR Peak grad:   12.8 mmHg MV Area VTI:   1.97 cm    TR Vmax:        179.00 cm/s MV Peak grad:  5.5 mmHg MV Mean grad:  2.0 mmHg    SHUNTS MV Vmax:       1.17 m/s    Systemic VTI:  0.15 m MV Vmean:      57.3 cm/s   Systemic Diam: 2.00 cm MV Decel Time: 86 msec MV E velocity: 49.80 cm/s MV A velocity: 78.60 cm/s MV E/A ratio:  0.63 Ida Rogue MD Electronically signed by Ida Rogue MD Signature Date/Time: 04/24/2022/12:18:55 PM    Final      Assessment/Plan: 72YFV with MM, immunocompromised host, T2DM, poorly controlled with DFU/osteomyelitis to left great toe, and distal 2nd digit with secondary streptococcal bacteremia  Streptococcal bacteremia = continue on amp/sub coverage. TTE negative for vegetation. Will get repeat blood cx today  DFU/osteomyelitis of left great toe = superficial wound cx showing polymicrobial of strep and enterococcus - continue on amp/sub. Podiatry to do partial amputation, to get clean margins of affected areas  -recommend ABI to see if has sufficient vasculature for healing   Leukopenia = likely from from infection response. Continue to  monitor  Multiple myeloma, not in remission = will reach out to dr. Janese Banks to see if patient needs acyclovir proph. Patient has not started new regimen. Due to covid roughly 3 wks ago and unintentional weight loss.  Dr Juleen China from ID to see him on Monday. Dr Linus Salmons available by phone over the weekend.  [facility] for Infectious Diseases Pager: [phone]  04/24/2022, 12:45 PM

## 2022-04-24 NOTE — Progress Notes (Signed)
Pharmacy Antibiotic Note  Nicholas Weiss is a 72 y.o. male admitted on 04/21/2022 with  foot infection and streptococcus bacteremia  .  Pharmacy has been consulted for ampicillin/sulbactam dosing. Patient currently receiving amoxicillin/clavulonate - tolerating with listed penicillin allergy which is unknown reaction as child. He has also been prescribed amoxicillin or amoxicillin/clavulanate several times in past.  Patient has left great toe wound and 2nd toe ulcer.  MRI revealed osteomyelitis of great toe and 2nd toe.  Plan for amputation by podiatry 1/25.  Today, 04/24/2022 Renal - SCr improved and CrCl now > 30  WBC WNL Afebrile 1/23 wound cx - Grp B strep 1/23 blood cx GPC in one bottle each set, BCID = streptococcus parasanguinis  Plan: Based on improved renal function and CrCl, continue ampicillin/sulbactam to 3gm IV q6h Follow renal function Await OR  Addendum 1/26: Blood culture update - S. Epi 1/4 bottles.  Start Vancomycin IV 1500 mg x1 loading dose Vancomycin IV 1,000 mg every 24 hours maintenance dose Goal AUC 400-600 Estimated AUC 515.1, Cminss 12.9 TBW (low muscle mass); Vd 0.72; Scr 1.37  Height: '5\' 10"'$  (177.8 cm) Weight: 63.6 kg (140 lb 3.4 oz) IBW/kg (Calculated) : 73  Temp (24hrs), Avg:98.2 F (36.8 C), Min:97.5 F (36.4 C), Max:98.8 F (37.1 C)  Recent Labs  Lab 04/20/22 2313 04/21/22 0057 04/21/22 0358 04/21/22 0444 04/21/22 0450 04/21/22 0530 04/21/22 1137 04/22/22 0415 04/23/22 0352 04/24/22 0640  WBC 5.9  --   --   --  5.6  --   --  5.0 2.9* 2.2*  CREATININE 3.88*  --   --    < >  --  3.35* 2.96* 2.44* 1.45* 1.37*  LATICACIDVEN  --  4.8* 2.2*  --   --   --   --   --   --   --    < > = values in this interval not displayed.     Estimated Creatinine Clearance: 45.8 mL/min (A) (by C-G formula based on SCr of 1.37 mg/dL (H)).    Allergies  Allergen Reactions   Penicillins Other (See Comments)    Childhood allergy -tolerated amoxil  03/2022 Unknown reaction Has patient had a PCN reaction causing immediate rash, facial/tongue/throat swelling, SOB or lightheadedness with hypotension: No Has patient had a PCN reaction causing severe rash involving mucus membranes or skin necrosis: No Has patient had a PCN reaction that required hospitalization No Has patient had a PCN reaction occurring within the last 10 years: No If all of the above answers are "NO", then may proceed with Cephalosporin use.     Antimicrobials this admission: 1/23 amox/clav >> 1/24 1/24 amp/sulb >>  Dose adjustments this admission:   Microbiology results: 1/23 wound cx pending 1/23 blood cx GPC in one bottle each set, BCID = streptococcus species ; 1/26 PM update: Staph epi 1/4 bottles   Thank you for allowing pharmacy to be a part of this patient's care.  Doreene Eland, PharmD, BCPS, BCIDP Work Cell: [phone] 04/24/2022 3:22 PM  Addendum:  Glean Salvo, PharmD, BCPS Clinical Pharmacist  04/24/2022 3:25 PM

## 2022-04-24 NOTE — Progress Notes (Signed)
Pharmacy Antibiotic Note  Nicholas Weiss is a 72 y.o. male admitted on 04/21/2022 with  foot infection and streptococcus bacteremia  .  Pharmacy has been consulted for ampicillin/sulbactam dosing. Patient currently receiving amoxicillin/clavulonate - tolerating with listed penicillin allergy which is unknown reaction as child. He has also been prescribed amoxicillin or amoxicillin/clavulanate several times in past.  Patient has left great toe wound and 2nd toe ulcer.  MRI revealed osteomyelitis of great toe and 2nd toe.  Plan for amputation by podiatry 1/25.  Today, 04/24/2022 Renal - SCr improved and CrCl now > 30  WBC WNL Afebrile 1/23 wound cx - Grp B strep 1/23 blood cx GPC in one bottle each set, BCID = streptococcus parasanguinis  Plan: Based on improved renal function and CrCl, continue ampicillin/sulbactam to 3gm IV q6h Follow renal function Repeat blood cx Await OR  Height: '5\' 10"'$  (177.8 cm) Weight: 63.6 kg (140 lb 3.4 oz) IBW/kg (Calculated) : 73  Temp (24hrs), Avg:98.2 F (36.8 C), Min:97.5 F (36.4 C), Max:98.8 F (37.1 C)  Recent Labs  Lab 04/20/22 2313 04/21/22 0057 04/21/22 0358 04/21/22 0444 04/21/22 0450 04/21/22 0530 04/21/22 1137 04/22/22 0415 04/23/22 0352 04/24/22 0640  WBC 5.9  --   --   --  5.6  --   --  5.0 2.9* 2.2*  CREATININE 3.88*  --   --    < >  --  3.35* 2.96* 2.44* 1.45* 1.37*  LATICACIDVEN  --  4.8* 2.2*  --   --   --   --   --   --   --    < > = values in this interval not displayed.     Estimated Creatinine Clearance: 45.8 mL/min (A) (by C-G formula based on SCr of 1.37 mg/dL (H)).    Allergies  Allergen Reactions   Penicillins Other (See Comments)    Childhood allergy -tolerated amoxil 03/2022 Unknown reaction Has patient had a PCN reaction causing immediate rash, facial/tongue/throat swelling, SOB or lightheadedness with hypotension: No Has patient had a PCN reaction causing severe rash involving mucus membranes or skin necrosis:  No Has patient had a PCN reaction that required hospitalization No Has patient had a PCN reaction occurring within the last 10 years: No If all of the above answers are "NO", then may proceed with Cephalosporin use.     Antimicrobials this admission: 1/23 amox/clav >> 1/24 1/24 amp/sulb >>  Dose adjustments this admission:   Microbiology results: 1/23 wound cx pending 1/23 blood cx GPC in one bottle each set, BCID = streptococcus species  Thank you for allowing pharmacy to be a part of this patient's care.  Doreene Eland, PharmD, BCPS, BCIDP Work Cell: [phone] 04/24/2022 12:05 PM

## 2022-04-24 NOTE — Progress Notes (Signed)
PHARMACY - PHYSICIAN COMMUNICATION CRITICAL VALUE ALERT - BLOOD CULTURE IDENTIFICATION (BCID)  Nicholas Weiss is an 72 y.o. male who presented to [facility] on 04/21/2022 with a chief complaint of Strep bacteremia and DFU/osteomyelitis.  Assessment:  Blood cultures updated- growing S. Epi in 1/4 bottles on 1/26. Awaiting resistance (no BCID on updated result)  Name of physician (or Provider) Contacted: Dr. Baxter Flattery  Current antibiotics: Unasyn  Changes to prescribed antibiotics recommended: Add vancomycin Recommendations accepted by provider  Results for orders placed or performed during the hospital encounter of 04/21/22  Blood Culture ID Panel (Reflexed) (Collected: 04/21/2022  3:50 AM)  Result Value Ref Range   Enterococcus faecalis NOT DETECTED NOT DETECTED   Enterococcus Faecium NOT DETECTED NOT DETECTED   Listeria monocytogenes NOT DETECTED NOT DETECTED   Staphylococcus species NOT DETECTED NOT DETECTED   Staphylococcus aureus (BCID) NOT DETECTED NOT DETECTED   Staphylococcus epidermidis NOT DETECTED NOT DETECTED   Staphylococcus lugdunensis NOT DETECTED NOT DETECTED   Streptococcus species DETECTED (A) NOT DETECTED   Streptococcus agalactiae NOT DETECTED NOT DETECTED   Streptococcus pneumoniae NOT DETECTED NOT DETECTED   Streptococcus pyogenes NOT DETECTED NOT DETECTED   A.calcoaceticus-baumannii NOT DETECTED NOT DETECTED   Bacteroides fragilis NOT DETECTED NOT DETECTED   Enterobacterales NOT DETECTED NOT DETECTED   Enterobacter cloacae complex NOT DETECTED NOT DETECTED   Escherichia coli NOT DETECTED NOT DETECTED   Klebsiella aerogenes NOT DETECTED NOT DETECTED   Klebsiella oxytoca NOT DETECTED NOT DETECTED   Klebsiella pneumoniae NOT DETECTED NOT DETECTED   Proteus species NOT DETECTED NOT DETECTED   Salmonella species NOT DETECTED NOT DETECTED   Serratia marcescens NOT DETECTED NOT DETECTED   Haemophilus influenzae NOT DETECTED NOT DETECTED   Neisseria meningitidis NOT  DETECTED NOT DETECTED   Pseudomonas aeruginosa NOT DETECTED NOT DETECTED   Stenotrophomonas maltophilia NOT DETECTED NOT DETECTED   Candida albicans NOT DETECTED NOT DETECTED   Candida auris NOT DETECTED NOT DETECTED   Candida glabrata NOT DETECTED NOT DETECTED   Candida krusei NOT DETECTED NOT DETECTED   Candida parapsilosis NOT DETECTED NOT DETECTED   Candida tropicalis NOT DETECTED NOT DETECTED   Cryptococcus neoformans/gattii NOT DETECTED NOT DETECTED    Wynelle Cleveland 04/24/2022  3:17 PM

## 2022-04-24 NOTE — Progress Notes (Signed)
Patient with acute change in mentation and apparent change in color after assisted to stand at bedside to change linen. Patient assisted back to bed and vitals attained as well as blood sugar. Attending MD paged and new orders received. Patient BP improved and mentation back to baseline after intervention    04/24/22 1502  Assess: MEWS Score  Temp 98 F (36.7 C)  BP (!) 68/48  MAP (mmHg) (!) 56  Pulse Rate 85  Level of Consciousness Responds to Voice  SpO2 98 %  O2 Device Room Air  Assess: MEWS Score  MEWS Temp 0  MEWS Systolic 3  MEWS Pulse 0  MEWS RR 0  MEWS LOC 1  MEWS Score 4  MEWS Score Color Red  Assess: if the MEWS score is Yellow or Red  Were vital signs taken at a resting state? Yes  Focused Assessment No change from prior assessment  Does the patient meet 2 or more of the SIRS criteria? Yes  Does the patient have a confirmed or suspected source of infection? Yes  Provider and Rapid Response Notified? Yes  MEWS guidelines implemented *See Row Information* Yes  Treat  MEWS Interventions Escalated (See documentation below)  Pain Scale 0-10  Pain Score 0  Take Vital Signs  Increase Vital Sign Frequency  Red: Q 1hr X 4 then Q 4hr X 4, if remains red, continue Q 4hrs  Escalate  MEWS: Escalate Red: discuss with charge nurse/RN and provider, consider discussing with RRT  Notify: Charge Nurse/RN  Name of Charge Nurse/RN Notified Ok Edwards  Date Charge Nurse/RN Notified 04/24/22  Time Charge Nurse/RN Notified 1518  Provider Notification  Provider Name/Title Dr. Arbutus Ped  Date Provider Notified 04/24/22  Time Provider Notified 1500  Method of Notification Page  Notification Reason Change in status  Provider response See new orders  Date of Provider Response 04/24/22  Time of Provider Response 1501  Document  Patient Outcome Stabilized after interventions  Progress note created (see row info) Yes  Assess: SIRS CRITERIA  SIRS Temperature  0  SIRS Pulse 0  SIRS  Respirations  0  SIRS WBC 1  SIRS Score Sum  1

## 2022-04-24 NOTE — Progress Notes (Signed)
*  PRELIMINARY RESULTS* Echocardiogram 2D Echocardiogram has been performed.  Nicholas Weiss 04/24/2022, 10:27 AM

## 2022-04-24 NOTE — Progress Notes (Signed)
FSBS 59, pt NPO, IV 50% dextrose given. Provider made aware, rate change added to D5NS infusion. Care ongoing.

## 2022-04-24 NOTE — Care Management Important Message (Signed)
Important Message  Patient Details  Name: Nicholas Weiss MRN: [id number] Date of Birth: 11/26/1950   Medicare Important Message Given:  N/A - LOS <3 / Initial given by admissions     Juliann Pulse A [place] 04/24/2022, 8:45 AM

## 2022-04-24 NOTE — Progress Notes (Signed)
FSBS 69, medicated as per [facility], report given to oncoming RN. Care ongoing.

## 2022-04-24 NOTE — Progress Notes (Addendum)
Blood sugar 33 '@2025'$ , pt refused drinks and meal, 50% dextrose given per PRN orders. Recheck now 165.

## 2022-04-24 NOTE — Progress Notes (Signed)
CROSS COVER NOTE  NAME: DOYEL MULKERN MRN: [id number] DOB : [birth year]    HPI/Events of Note   Notified of hypoglycemic events and patient will be NPO after midnight  Assessment and  Interventions   Assessment: Current maintenance iv fluids NS at 75 ml/h Plan: Change continuous fluid infusion to D5NS at 75 ml/h  Monitor glucose every 4 h while npo       Kathlene Cote NP [facility]

## 2022-04-24 NOTE — Assessment & Plan Note (Signed)
Pt BP dropped to 68/48 today (1/26) when standing while bed linens were changed. --500 cc bolus NS --D5-NS @ 75 cc/hr after bolus --Monitor closely --Fall precautions

## 2022-04-24 NOTE — Progress Notes (Signed)
FSBS 59, patient had juice and had snacks, provider notified, recheck 109, D5NS ordered and commenced for maintenance.

## 2022-04-25 ENCOUNTER — Inpatient Hospital Stay: Payer: 59

## 2022-04-25 ENCOUNTER — Encounter
Admission: EM | Disposition: A | Discharge status: Home / Self Care | Payer: Self-pay | Source: Home / Self Care | Attending: Internal Medicine

## 2022-04-25 ENCOUNTER — Inpatient Hospital Stay: Payer: 59 | Admitting: Anesthesiology

## 2022-04-25 DIAGNOSIS — M86672 Other chronic osteomyelitis, left ankle and foot: Secondary | ICD-10-CM

## 2022-04-25 DIAGNOSIS — M869 Osteomyelitis, unspecified: Secondary | ICD-10-CM | POA: Diagnosis not present

## 2022-04-25 DIAGNOSIS — R7881 Bacteremia: Secondary | ICD-10-CM | POA: Diagnosis not present

## 2022-04-25 DIAGNOSIS — E162 Hypoglycemia, unspecified: Secondary | ICD-10-CM | POA: Diagnosis not present

## 2022-04-25 DIAGNOSIS — E1040 Type 1 diabetes mellitus with diabetic neuropathy, unspecified: Secondary | ICD-10-CM | POA: Diagnosis not present

## 2022-04-25 HISTORY — PX: AMPUTATION TOE: SHX6595

## 2022-04-25 LAB — AEROBIC/ANAEROBIC CULTURE W GRAM STAIN (SURGICAL/DEEP WOUND)

## 2022-04-25 LAB — GLUCOSE, CAPILLARY
Glucose-Capillary: 114 mg/dL — ABNORMAL HIGH (ref 70–99)
Glucose-Capillary: 137 mg/dL — ABNORMAL HIGH (ref 70–99)
Glucose-Capillary: 137 mg/dL — ABNORMAL HIGH (ref 70–99)
Glucose-Capillary: 162 mg/dL — ABNORMAL HIGH (ref 70–99)
Glucose-Capillary: 167 mg/dL — ABNORMAL HIGH (ref 70–99)
Glucose-Capillary: 170 mg/dL — ABNORMAL HIGH (ref 70–99)
Glucose-Capillary: 184 mg/dL — ABNORMAL HIGH (ref 70–99)
Glucose-Capillary: 47 mg/dL — ABNORMAL LOW (ref 70–99)
Glucose-Capillary: 59 mg/dL — ABNORMAL LOW (ref 70–99)
Glucose-Capillary: 98 mg/dL (ref 70–99)

## 2022-04-25 LAB — URINE DRUG SCREEN, QUALITATIVE (ARMC ONLY)
Amphetamines, Ur Screen: NOT DETECTED
Barbiturates, Ur Screen: NOT DETECTED
Benzodiazepine, Ur Scrn: NOT DETECTED
Cannabinoid 50 Ng, Ur ~~LOC~~: POSITIVE — AB
Cocaine Metabolite,Ur ~~LOC~~: NOT DETECTED
MDMA (Ecstasy)Ur Screen: NOT DETECTED
Methadone Scn, Ur: NOT DETECTED
Opiate, Ur Screen: NOT DETECTED
Phencyclidine (PCP) Ur S: NOT DETECTED
Tricyclic, Ur Screen: NOT DETECTED

## 2022-04-25 LAB — CBC
HCT: 27 % — ABNORMAL LOW (ref 39.0–52.0)
Hemoglobin: 8.6 g/dL — ABNORMAL LOW (ref 13.0–17.0)
MCH: 29.4 pg (ref 26.0–34.0)
MCHC: 31.9 g/dL (ref 30.0–36.0)
MCV: 92.2 fL (ref 80.0–100.0)
Platelets: 145 10*3/uL — ABNORMAL LOW (ref 150–400)
RBC: 2.93 MIL/uL — ABNORMAL LOW (ref 4.22–5.81)
RDW: 15.9 % — ABNORMAL HIGH (ref 11.5–15.5)
WBC: 2.2 10*3/uL — ABNORMAL LOW (ref 4.0–10.5)
nRBC: 0 % (ref 0.0–0.2)

## 2022-04-25 LAB — BASIC METABOLIC PANEL
Anion gap: 8 (ref 5–15)
BUN: 27 mg/dL — ABNORMAL HIGH (ref 8–23)
CO2: 24 mmol/L (ref 22–32)
Calcium: 7.9 mg/dL — ABNORMAL LOW (ref 8.9–10.3)
Chloride: 107 mmol/L (ref 98–111)
Creatinine, Ser: 1.14 mg/dL (ref 0.61–1.24)
GFR, Estimated: >60 mL/min (ref >60)
Glucose, Bld: 113 mg/dL — ABNORMAL HIGH (ref 70–99)
Potassium: 4 mmol/L (ref 3.5–5.1)
Sodium: 139 mmol/L (ref 135–145)

## 2022-04-25 LAB — CULTURE, BLOOD (ROUTINE X 2)

## 2022-04-25 SURGERY — AMPUTATION, TOE
Anesthesia: General | Site: Toe | Laterality: Left

## 2022-04-25 MED ORDER — EPHEDRINE SULFATE (PRESSORS) 50 MG/ML IJ SOLN
INTRAMUSCULAR | Status: DC | PRN
  Administered 2022-04-25: 5 mg via INTRAVENOUS
  Administered 2022-04-25: 10 mg via INTRAVENOUS
  Administered 2022-04-25: 5 mg via INTRAVENOUS

## 2022-04-25 MED ORDER — MIDAZOLAM HCL 2 MG/2ML IJ SOLN
INTRAMUSCULAR | Status: AC
  Filled 2022-04-25: qty 2

## 2022-04-25 MED ORDER — SUCCINYLCHOLINE CHLORIDE 200 MG/10ML IV SOSY
PREFILLED_SYRINGE | INTRAVENOUS | Status: AC
  Filled 2022-04-25: qty 10

## 2022-04-25 MED ORDER — ONDANSETRON HCL 4 MG/2ML IJ SOLN
INTRAMUSCULAR | Status: AC
  Filled 2022-04-25: qty 2

## 2022-04-25 MED ORDER — OXYCODONE HCL 5 MG/5ML PO SOLN: 5.0000 mg | Freq: Once | ORAL | Status: DC | PRN

## 2022-04-25 MED ORDER — LIDOCAINE HCL (PF) 2 % IJ SOLN
INTRAMUSCULAR | Status: AC
  Filled 2022-04-25: qty 5

## 2022-04-25 MED ORDER — PROPOFOL 10 MG/ML IV BOLUS
INTRAVENOUS | Status: DC | PRN
  Administered 2022-04-25: 100 mg via INTRAVENOUS

## 2022-04-25 MED ORDER — MIDAZOLAM HCL 2 MG/2ML IJ SOLN
INTRAMUSCULAR | Status: DC | PRN
  Administered 2022-04-25: 1 mg via INTRAVENOUS

## 2022-04-25 MED ORDER — SUCCINYLCHOLINE CHLORIDE 200 MG/10ML IV SOSY
PREFILLED_SYRINGE | INTRAVENOUS | Status: DC | PRN
  Administered 2022-04-25: 100 mg via INTRAVENOUS

## 2022-04-25 MED ORDER — EPHEDRINE 5 MG/ML INJ
INTRAVENOUS | Status: AC
  Filled 2022-04-25: qty 5

## 2022-04-25 MED ORDER — PROPOFOL 10 MG/ML IV BOLUS
INTRAVENOUS | Status: AC
  Filled 2022-04-25: qty 20

## 2022-04-25 MED ORDER — LIDOCAINE HCL (CARDIAC) PF 100 MG/5ML IV SOSY
PREFILLED_SYRINGE | INTRAVENOUS | Status: DC | PRN
  Administered 2022-04-25: 80 mg via INTRAVENOUS

## 2022-04-25 MED ORDER — SODIUM CHLORIDE 0.9 % IV SOLN: INTRAVENOUS | Status: DC | PRN

## 2022-04-25 MED ORDER — BUPIVACAINE HCL 0.5 % IJ SOLN
INTRAMUSCULAR | Status: DC | PRN
  Administered 2022-04-25: 10 mL

## 2022-04-25 MED ORDER — NEOMYCIN-POLYMYXIN B GU 40-200000 IR SOLN
Status: DC | PRN
  Administered 2022-04-25: 2 mL

## 2022-04-25 MED ORDER — OXYCODONE HCL 5 MG PO TABS: 5.0000 mg | ORAL_TABLET | Freq: Once | ORAL | Status: DC | PRN

## 2022-04-25 MED ORDER — ONDANSETRON HCL 4 MG/2ML IJ SOLN
INTRAMUSCULAR | Status: DC | PRN
  Administered 2022-04-25: 4 mg via INTRAVENOUS

## 2022-04-25 MED ORDER — LIDOCAINE HCL (PF) 1 % IJ SOLN
INTRAMUSCULAR | Status: DC | PRN
  Administered 2022-04-25: 10 mL

## 2022-04-25 MED ORDER — HYDROMORPHONE HCL 1 MG/ML IJ SOLN: 0.2500 mg | INTRAMUSCULAR | Status: DC | PRN

## 2022-04-25 MED ORDER — FENTANYL CITRATE (PF) 100 MCG/2ML IJ SOLN
INTRAMUSCULAR | Status: AC
  Filled 2022-04-25: qty 2

## 2022-04-25 MED ORDER — 0.9 % SODIUM CHLORIDE (POUR BTL) OPTIME
TOPICAL | Status: DC | PRN
  Administered 2022-04-25: 500 mL

## 2022-04-25 MED ORDER — FENTANYL CITRATE (PF) 100 MCG/2ML IJ SOLN
INTRAMUSCULAR | Status: DC | PRN
  Administered 2022-04-25: 50 ug via INTRAVENOUS

## 2022-04-25 SURGICAL SUPPLY — 50 items
BLADE MED AGGRESSIVE (BLADE) ×1 IMPLANT
BLADE OSC/SAGITTAL MD 5.5X18 (BLADE) IMPLANT
BLADE SURG 15 STRL LF DISP TIS (BLADE) IMPLANT
BLADE SURG 15 STRL SS (BLADE) ×1
BLADE SURG MINI STRL (BLADE) IMPLANT
BNDG ELASTIC 4X5.8 VLCR STR LF (GAUZE/BANDAGES/DRESSINGS) ×1 IMPLANT
BNDG ESMARCH 4 X 12 STRL LF (GAUZE/BANDAGES/DRESSINGS) ×1
BNDG ESMARCH 4X12 STRL LF (GAUZE/BANDAGES/DRESSINGS) ×1 IMPLANT
BNDG GAUZE DERMACEA FLUFF 4 (GAUZE/BANDAGES/DRESSINGS) ×1 IMPLANT
BNDG STRETCH 4X75 STRL LF (GAUZE/BANDAGES/DRESSINGS) IMPLANT
CNTNR URN SCR LID CUP LEK RST (MISCELLANEOUS) ×1 IMPLANT
CONT SPEC 4OZ STRL OR WHT (MISCELLANEOUS) ×1
CUFF TOURN SGL QUICK 12 (TOURNIQUET CUFF) IMPLANT
CUFF TOURN SGL QUICK 18X4 (TOURNIQUET CUFF) IMPLANT
DRAPE FLUOR MINI C-ARM 54X84 (DRAPES) IMPLANT
DRSG XEROFORM 1X8 (GAUZE/BANDAGES/DRESSINGS) IMPLANT
DURAPREP 26ML APPLICATOR (WOUND CARE) ×1 IMPLANT
ELECT REM PT RETURN 9FT ADLT (ELECTROSURGICAL) ×1
ELECTRODE REM PT RTRN 9FT ADLT (ELECTROSURGICAL) ×1 IMPLANT
GAUZE SPONGE 4X4 12PLY STRL (GAUZE/BANDAGES/DRESSINGS) ×2 IMPLANT
GAUZE STRETCH 2X75IN STRL (MISCELLANEOUS) IMPLANT
GAUZE XEROFORM 1X8 LF (GAUZE/BANDAGES/DRESSINGS) ×1 IMPLANT
GLOVE BIO SURGEON STRL SZ8 (GLOVE) ×1 IMPLANT
GLOVE BIOGEL PI IND STRL 8 (GLOVE) ×1 IMPLANT
GOWN STRL REUS W/ TWL LRG LVL3 (GOWN DISPOSABLE) ×2 IMPLANT
GOWN STRL REUS W/TWL LRG LVL3 (GOWN DISPOSABLE) ×2
HANDPIECE VERSAJET DEBRIDEMENT (MISCELLANEOUS) IMPLANT
IV NS IRRIG 3000ML ARTHROMATIC (IV SOLUTION) IMPLANT
KIT TURNOVER KIT A (KITS) ×1 IMPLANT
LABEL OR SOLS (LABEL) ×1 IMPLANT
MANIFOLD NEPTUNE II (INSTRUMENTS) ×1 IMPLANT
NDL FILTER BLUNT 18X1 1/2 (NEEDLE) ×1 IMPLANT
NDL HYPO 25X1 1.5 SAFETY (NEEDLE) ×2 IMPLANT
NEEDLE FILTER BLUNT 18X1 1/2 (NEEDLE) ×1 IMPLANT
NEEDLE HYPO 25X1 1.5 SAFETY (NEEDLE) ×2 IMPLANT
NS IRRIG 500ML POUR BTL (IV SOLUTION) ×1 IMPLANT
PACK EXTREMITY ARMC (MISCELLANEOUS) ×1 IMPLANT
PAD ABD DERMACEA PRESS 5X9 (GAUZE/BANDAGES/DRESSINGS) IMPLANT
SOL PREP PVP 2OZ (MISCELLANEOUS) ×1
SOLUTION PREP PVP 2OZ (MISCELLANEOUS) ×1 IMPLANT
STOCKINETTE STRL 6IN 960660 (GAUZE/BANDAGES/DRESSINGS) ×1 IMPLANT
SUT ETHILON 3-0 FS-10 30 BLK (SUTURE) ×2
SUT PROLENE 3 0 PS 2 (SUTURE) IMPLANT
SUT VIC AB 3-0 SH 27 (SUTURE) ×1
SUT VIC AB 3-0 SH 27X BRD (SUTURE) ×1 IMPLANT
SUTURE EHLN 3-0 FS-10 30 BLK (SUTURE) ×2 IMPLANT
SWAB CULTURE AMIES ANAERIB BLU (MISCELLANEOUS) IMPLANT
SYR 10ML LL (SYRINGE) ×1 IMPLANT
TRAP FLUID SMOKE EVACUATOR (MISCELLANEOUS) ×1 IMPLANT
WATER STERILE IRR 500ML POUR (IV SOLUTION) ×1 IMPLANT

## 2022-04-25 NOTE — Assessment & Plan Note (Signed)
POA, resolved. Continue Levemir and Novolog Increase Levemir 10 >> 16 units daily Adjust for goal 140-180 Hypoglycemia protocol.

## 2022-04-25 NOTE — Brief Op Note (Signed)
04/25/2022  1:50 PM  PATIENT:  Nicholas Weiss  72 y.o. male  PRE-OPERATIVE DIAGNOSIS:  Osteomylitis  POST-OPERATIVE DIAGNOSIS:  Osteomylitis  PROCEDURE:  Procedure(s): LEFT GREAT TOE AMPUTATION AND LEFT PARTIAL 2ND TOE AMPUTATION (Left)  SURGEON:  Surgeon(s) and Role:    Edrick Kins, DPM - Primary  PHYSICIAN ASSISTANT:   ASSISTANTS: none   ANESTHESIA:   MAC  EBL:  5 mL   BLOOD ADMINISTERED:none  DRAINS: none   LOCAL MEDICATIONS USED:  MARCAINE   , LIDOCAINE , and Amount: 15 ml  SPECIMEN:  No Specimen  DISPOSITION OF SPECIMEN:  N/A  COUNTS:  YES  TOURNIQUET:  * Missing tourniquet times found for documented tourniquets in log: [id number] *  DICTATION: .Dragon Dictation  PLAN OF CARE: Admit to inpatient   PATIENT DISPOSITION:  PACU - hemodynamically stable.   Delay start of Pharmacological VTE agent (>24hrs) due to surgical blood loss or risk of bleeding: not applicable

## 2022-04-25 NOTE — Anesthesia Postprocedure Evaluation (Signed)
Anesthesia Post Note  Patient: Nicholas Weiss  Procedure(s) Performed: LEFT GREAT TOE AMPUTATION AND LEFT PARTIAL 2ND TOE AMPUTATION (Left: Toe)  Patient location during evaluation: PACU Anesthesia Type: General Level of consciousness: awake and alert Pain management: pain level controlled Vital Signs Assessment: post-procedure vital signs reviewed and stable Respiratory status: spontaneous breathing, nonlabored ventilation, respiratory function stable and patient connected to nasal cannula oxygen Cardiovascular status: blood pressure returned to baseline and stable Postop Assessment: no apparent nausea or vomiting Anesthetic complications: no   No notable events documented.   Last Vitals:  Vitals:   04/25/22 1556 04/25/22 2005  BP: (!) 140/77 (!) 141/77  Pulse: 76 72  Resp: 18 18  Temp: 36.4 C 36.6 C  SpO2: 100% 100%    Last Pain:  Vitals:   04/25/22 1418  TempSrc:   PainSc: 0-No pain                 Ilene Qua

## 2022-04-25 NOTE — Interval H&P Note (Signed)
History and Physical Interval Note:  04/25/2022 12:26 PM  Nicholas Weiss  has presented today for surgery, with the diagnosis of Osteomylitis.  The various methods of treatment have been discussed with the patient and family. After consideration of risks, benefits and other options for treatment, the patient has consented to  Procedure(s): LEFT GREAT TOE AMPUTATION AND LEFT PARTIAL 2ND TOE AMPUTATION (Left) as a surgical intervention.  The patient's history has been reviewed, patient examined, no change in status, stable for surgery.  I have reviewed the patient's chart and labs.  Questions were answered to the patient's satisfaction.     Edrick Kins

## 2022-04-25 NOTE — Interval H&P Note (Signed)
History and Physical Interval Note:  04/25/2022 9:48 AM  Nicholas Weiss  has presented today for surgery, with the diagnosis of Osteomylitis.  The various methods of treatment have been discussed with the patient and family. After consideration of risks, benefits and other options for treatment, the patient has consented to  Procedure(s): LEFT GREAT TOE AMPUTATION AND LEFT PARTIAL 2ND TOE AMPUTATION (Left) as a surgical intervention.  The patient's history has been reviewed, patient examined, no change in status, stable for surgery.  I have reviewed the patient's chart and labs.  Questions were answered to the patient's satisfaction.     Edrick Kins

## 2022-04-25 NOTE — Progress Notes (Signed)
Progress Note   Patient: Nicholas Weiss ZOX:[id number] DOB: 1950/05/22 DOA: 04/21/2022     4 DOS: the patient was seen and examined on 04/25/2022   Brief hospital course: HPI on admission, from ICU H&P: "72 y.o with significant PMH of multiple myeloma, atrial fibrillation, T1DM, with recuirrent DKA admissions,  AV block first-degree, CKD stage III, CVA, chronic anemia with abnormal SPEP, HTN, HLD and cocaine abuse who presented to the ED with chief complaints of hyperglycemia.    Patient report he has been feeling unwell x 1 week and was unable to tolerate po intake or take his medications. He initially reported hypoglycemia however today he complained of nausea and vomiting . He was recently discharged on 0/11/2022 following admission for DKA and COVID treated with Paxlovid.   ED Course: Initial vital signs showed HR of 110 beats/minute, BP 105/50 mm Hg, the RR 16 breaths/minute, and the oxygen saturation 100 % on RA and a temperature of 97.35F (36.5C). Pertinent Labs/Diagnostics Findings: Chemistry:Na+/ K+: 137/7.5 Glucose 897: BUN/Cr.  89/3.88, CO2 8, Anion gap 30 CBC: WBC: 5.9 Hgb/Hct: 10.5/38.0  Other Lab findings: Beta hydroxybutyrate>8.0, Lactic acid: 4.8 COVID PCR: positive on 04/07/22 Venous Blood Gas result:  pO2 51; pCO2 34; pH 7.02;  HCO3 8.8, %O2 Sat 58.1.    The laboratory data showed consistent with patient's well-known recurrent DKA. Urinalysis and chest xray demonstrated no evidence of infection.  Given ECG changes and severe AKI, the management was initiated with initial intravenous fluid, hyperkalemia correction with IV calcium gluconate, and intravenous (IV) bicarbonate push followed by intravenous insulin bolus and infusion as per DKA protocol. Serum electrolytes were closely monitored. PCCM consulted to admit for further management of DKA."  1/24: [facility] assumed care.  Pt off insulin drip.  Seen by podiatry for left foot osteomyelitis.  They plan for amputation this admission.  Blood cultures also positive for GPC's, suspect foot is the source.  Changed Augmentin to Unasyn. 1/25: left great toe amputation planned but cancelled by anesthesia due to cocaine+.  ID consulted.  Continuing Unasyn. 1/26: hypoglemic episodes requiring dextrose fluids. Surgery cancelled again, stlil cocain positive UDS this AM.  New symptomatic orthostatic hypotension afternoon, 500 cc bolus and resume maintenance fluids with D5-NS  Assessment and Plan: * DKA, type 1 (HCC) POA, resolved. Continue Levemir and Novolog Increase Levemir 10 >> 16 units daily Adjust for goal 140-180 Hypoglycemia protocol.  Streptococcal bacteremia See osteomyelitis. Appreciate ID's assistance.  Osteomyelitis of left foot [facility]) Podiatry following, planning for amputation. Surgery delayed a few days due to  positive for cocaine, anesthesia here will no sedate. UDS negative this AM Surgery planned 11 AM today Continue Unasyn given concurrent Streptococcal Bacteremia. Pharmacist started Vancomycin yesterday afternoon due to 1/4 bottle with staph epi.  Stop Vancomycin.  Confirmed with ID on call this weekend. Follow blood cultures and obtain repeats tomorrow Appreciate Infectious Disease input. Echo (TTE) pending. Follow operative cultures once obtained.  Sepsis Ruled Out.  Pt did not meet 2+ SIRS criteria, only tachycardia.  Lactic acidosis due to dehydration with DKA.  Hypoglycemia With NPO status for potential surgery, patient has been having hypoglycemic episodes.  He required D5-NS at 125/hr followed by D10 on AM of 1/26 --Diet resumed since surgery again delayed --On D10 while NPO for surgery --Continue close monitoring --Hypoglycemia protocol  Acute renal failure superimposed on stage 3a chronic kidney disease ([place]) Cr on admission 2.96. Cr today 2.44 >> 1.45 >> 1.37...1.14 Improved with IV hydration. Likely pre-renal azotemia.  Hyperkalemia Resolved.  Anemia of chronic disease Hbg stable.  Monitor CBC.  Essential (primary) hypertension ACEI/ARC held due to AKI. Continue amlodipine. PRN IV hydralazine  Type 1 diabetes mellitus with diabetic neuropathy, unspecified ([place]) See DKA  History of substance abuse ([place]) UDS positive for cocaine and cannabis. Counseled on importance of avoiding substances, these likely contribute to DKA. Repeat UDS tomorrow AM (1/26). Surgery cancelled until negative for cocaine.  History of CVA (cerebrovascular accident) Continue statin Hold ASA, Plavix for surgery  Dyslipidemia Continue statin  Multiple myeloma ([place]) -Currently on Revlimid -Follows with oncology -On acyclovir for prophylaxis, needs refills sent at d/c  Sympathotonic orthostatic hypotension Pt BP dropped to 68/48 today (1/26) when standing while bed linens were changed. --500 cc bolus NS --D5-NS @ 75 cc/hr after bolus --Monitor closely --Fall precautions  Leukopenia Likely due to infection +/- dilution.   WBC 2.2k Monitor CBC, check diff with AM labs  Diabetic ulcer of toe associated with diabetes mellitus due to underlying condition, with fat layer exposed ([place]) See osteomyelitis  Weight loss Pt reports 24 lb weight loss in past month, very poor appetite.  Appreciate dietitian consultation and recommendations.        Subjective: Pt seen this AM. He was refusing to have surgery and perseverating on not being allowed to eat.  Once I explained importance of surgery to remove infection, he was again agreeable.  He denies other complaints, only being starved and wanting to eat.    Physical Exam: Vitals:   04/25/22 0007 04/25/22 0730 04/25/22 0906 04/25/22 1100  BP: (!) 141/76 (!) 142/80 (!) 154/79 (!) 146/97  Pulse: 78 73 75 81  Resp: '16 16  16  '$ Temp: 97.7 F (36.5 C) 98.4 F (36.9 C)  99 F (37.2 C)  TempSrc:  Oral    SpO2: 100% 100%  100%  Weight:      Height:       General exam: awake resting in bed, no acute distress HEENT: moist mucus  membranes, hearing grossly normal  Respiratory system: on room air, normal respiratory effort. CTAB Cardiovascular system: normal S1/S2, RRRno pedal edema.   Gastrointestinal system: soft, NT, ND Central nervous system: A&O x3. no gross focal neurologic deficits, normal speech Extremities: left foot with clean dry intact dressing in place, no edema, normal tone Skin: dry, intact, normal temperature, normal color Psychiatry: normal mood, congruent affect, judgement and insight appear normal   Data Reviewed:  Notable labs -- UDS this AM negative for cocaine, still cannabis positive. BMP normal except glucose 113, BUN 27, Ca 7.9.   CBC with stable WBC 2.2, Hbg 8.6 from 8.9  Echo (TTE) - showed EF 52-77%, grade 1 diastolic dysfunction, mild LVH of basal/septal segment, no significant valvular disease   Family Communication: girlfriend at bedside on rounds 1/25  Disposition: Status is: Inpatient Remains inpatient appropriate because: Severity of illness remaining on IV therapies, recurrent hypoglycemic episodes and IV antibiotics for bacteremia secondary to osteomyelitis with surgery planned.    Planned Discharge Destination: Home    Time spent: 40 minutes  Author: Ezekiel Slocumb, DO 04/25/2022 12:57 PM  For on call review [email or web address].

## 2022-04-25 NOTE — Assessment & Plan Note (Signed)
See osteomyelitis

## 2022-04-25 NOTE — Anesthesia Procedure Notes (Signed)
Procedure Name: Intubation Date/Time: 04/25/2022 12:42 PM  Performed by: Jerrye Noble, CRNAPre-anesthesia Checklist: Patient identified, Emergency Drugs available, Suction available and Patient being monitored Patient Re-evaluated:Patient Re-evaluated prior to induction Oxygen Delivery Method: Circle system utilized Preoxygenation: Pre-oxygenation with 100% oxygen Induction Type: IV induction and Rapid sequence Laryngoscope Size: McGraph and 3 Grade View: Grade I Tube type: Oral Tube size: 7.0 mm Number of attempts: 1 Airway Equipment and Method: Stylet and Video-laryngoscopy Placement Confirmation: ETT inserted through vocal cords under direct vision, positive ETCO2 and breath sounds checked- equal and bilateral Secured at: 23 cm Tube secured with: Tape Dental Injury: Teeth and Oropharynx as per pre-operative assessment

## 2022-04-25 NOTE — Transfer of Care (Signed)
Immediate Anesthesia Transfer of Care Note  Patient: Nicholas Weiss  Procedure(s) Performed: LEFT GREAT TOE AMPUTATION AND LEFT PARTIAL 2ND TOE AMPUTATION (Left: Toe)  Patient Location: PACU  Anesthesia Type:General  Level of Consciousness: awake, drowsy, and patient cooperative  Airway & Oxygen Therapy: Patient Spontanous Breathing and Patient connected to face mask oxygen  Post-op Assessment: Report given to RN and Post -op Vital signs reviewed and stable  Post vital signs: Reviewed and stable  Last Vitals:  Vitals Value Taken Time  BP 148/71 04/25/22 1330  Temp 36.6 C 04/25/22 1330  Pulse 75 04/25/22 1334  Resp 11 04/25/22 1334  SpO2 100 % 04/25/22 1334  Vitals shown include unvalidated device data.  Last Pain:  Vitals:   04/25/22 1330  TempSrc:   PainSc: 0-No pain         Complications: No notable events documented.

## 2022-04-25 NOTE — Op Note (Signed)
OPERATIVE REPORT Patient name: Nicholas Weiss MRN: [id number] DOB: 1950-12-20  DOS: 04/25/22  Preop Dx: Osteomyelitis left great toe and left second toe Postop Dx: same  Procedure:  1.  Left great toe amputation 2.  Left second toe partial amputation  Surgeon: Edrick Kins DPM  Anesthesia: 50-50 mixture of 2% lidocaine plain with 0.5% Marcaine plain totaling 15 mL infiltrated in the patient's left forefoot  Hemostasis: No tourniquet utilized.  Esmarch exsanguination which was applied and held in place during the procedure  EBL: Minimal mL Materials: None Injectables: None Pathology: None  Condition: The patient tolerated the procedure and anesthesia well. No complications noted or reported   Justification for procedure: The patient is a 72 y.o. male who presents today for surgical correction of osteomyelitis of the left great toe and second toe. The patient was told benefits as well as possible side effects of the surgery. The patient consented for surgical correction. The patient consent form was reviewed. All patient questions were answered. No guarantees were expressed or implied. The patient and the surgeon both signed the patient consent form with the witness present and placed in the patient's chart.   Procedure in Detail: The patient was brought to the operating room, placed in the operating table in the supine position at which time an aseptic scrub and drape were performed about the patient's respective lower extremity after anesthesia was induced as described above. Attention was then directed to the surgical area where procedure number one commenced.  Procedure #1: Amputation left great toe A racquet type incision was planned and made around the first MTP of the left great toe joint.  The incision was carried down to the level of bone and joint capsule using a #15 scalpel.  Capsulotomy was performed around the toe and the toe was distracted and disarticulated at the  level of the MTP and removed in toto.  Copious irrigation was utilized and intraoperatively the amputation site was inspected which demonstrated healthy clean and viable margins.  Primary closure was achieved using 3-0 Prolene suture  Procedure #2: Partial amputation left second toe A fishmouth type incision was planned and made around the PIPJ of the second digit of the left toe using #15 scalpel.  Incision was carried down to the level of bone and joint where the distal portion of the toe was disarticulated at the level of the PIPJ and removed in toto.  The head of the proximal phalanx was somewhat hypertrophic impeding primary closure of the skin flaps so the decision was made to resect away the head of the proximal phalanx using a bone cutter forceps.  This allowed for good closure of the skin incisions.  Irrigation was utilized and primary closure achieved using 3-0 Prolene suture  Dry sterile compressive dressings were then applied to all previously mentioned incision sites about the patient's lower extremity. The tourniquet which was used for hemostasis was deflated. All normal neurovascular responses including pink color and warmth returned all the digits of patient's lower extremity.  The patient was then transferred from the operating room to the recovery room having tolerated the procedure and anesthesia well. All vital signs are stable. After a brief stay in the recovery room the patient was readmitted to inpatient room with postoperative orders placed.    IMPRESSION: Clean margins.  Assuming surgical eradication and source control of the osteomyelitis.  Good potential for healing  Edrick Kins, DPM [facility]  Dr. Edrick Kins,  DPM    [address], [place] [address]                Office [phone]  Fax [phone]

## 2022-04-25 NOTE — Assessment & Plan Note (Signed)
Likely due to infection +/- dilution.   WBC 2.2k Monitor CBC, check diff with AM labs

## 2022-04-25 NOTE — Anesthesia Preprocedure Evaluation (Addendum)
Anesthesia Evaluation  Patient identified by MRN, date of birth, ID band Patient awake    Reviewed: Allergy & Precautions, NPO status , Patient's Chart, lab work & pertinent test results  History of Anesthesia Complications Negative for: history of anesthetic complications  Airway Mallampati: II  TM Distance: >3 FB Neck ROM: full    Dental  (+) Edentulous Upper, Edentulous Lower   Pulmonary neg pulmonary ROS   Pulmonary exam normal        Cardiovascular hypertension, + dysrhythmias (First degree AV block \) Atrial Fibrillation      Neuro/Psych CVA  negative psych ROS   GI/Hepatic PUD,,,(+)     substance abuse  cocaine use and marijuana use  Endo/Other  diabetes, Type 1, Insulin Dependent  DKA on admission, now resolved   Renal/GU ARF and CRFRenal disease     Musculoskeletal   Abdominal   Peds  Hematology  (+) Blood dyscrasia, anemia Multiple myeloma  Strep bacteremia    Anesthesia Other Findings Past Medical History: 04/06/2016: DKA (diabetic ketoacidoses) No date: Hypercholesteremia No date: Hypertension  Past Surgical History: 02/01/2020: COLONOSCOPY WITH PROPOFOL; N/A     Comment:  Procedure: COLONOSCOPY WITH PROPOFOL;  Surgeon: Lin Landsman, MD;  Location: [facility] ENDOSCOPY;  Service:               Gastroenterology;  Laterality: N/A; 02/01/2020: ESOPHAGOGASTRODUODENOSCOPY     Comment:  Procedure: ESOPHAGOGASTRODUODENOSCOPY (EGD);  Surgeon:               Lin Landsman, MD;  Location: [facility] ENDOSCOPY;                Service: Gastroenterology;; No date: KNEE SURGERY; Right     Comment:  Torn meniscus No date: KNEE SURGERY; Left  BMI    Body Mass Index: 20.12 kg/m      Reproductive/Obstetrics negative OB ROS                             Anesthesia Physical Anesthesia Plan  ASA: 4  Anesthesia Plan: General ETT   Post-op Pain Management: Ofirmev IV  (intra-op)* and Dilaudid IV   Induction: Intravenous  PONV Risk Score and Plan: 2 and Ondansetron, Dexamethasone and Treatment may vary due to age or medical condition  Airway Management Planned: Oral ETT  Additional Equipment:   Intra-op Plan:   Post-operative Plan: Extubation in OR  Informed Consent: I have reviewed the patients History and Physical, chart, labs and discussed the procedure including the risks, benefits and alternatives for the proposed anesthesia with the patient or authorized representative who has indicated his/her understanding and acceptance.     Dental Advisory Given  Plan Discussed with: Anesthesiologist, CRNA and Surgeon  Anesthesia Plan Comments: (Patient consented for risks of anesthesia including but not limited to:  - adverse reactions to medications - damage to eyes, teeth, lips or other oral mucosa - nerve damage due to positioning  - sore throat or hoarseness - Damage to heart, brain, nerves, lungs, other parts of body or loss of life  Patient voiced understanding.)        Anesthesia Quick Evaluation

## 2022-04-25 NOTE — Inpatient Diabetes Management (Signed)
Inpatient Diabetes Program Recommendations  AACE/ADA: New Consensus Statement on Inpatient Glycemic Control (2015)  Target Ranges:  Prepandial:   less than 140 mg/dL      Peak postprandial:   less than 180 mg/dL (1-2 hours)      Critically ill patients:  140 - 180 mg/dL   Lab Results  Component Value Date   GLUCAP 162 (H) 04/25/2022   HGBA1C 9.1 (H) 03/09/2022    Latest Reference Range & Units 04/25/22 00:04 04/25/22 00:38 04/25/22 01:17 04/25/22 03:45 04/25/22 07:32 04/25/22 09:11  Glucose-Capillary 70 - 99 mg/dL 59 (L) 47 (L) 167 (H) 114 (H) 98 162 (H)  (L): Data is abnormally low (H): Data is abnormally high Review of Glycemic Control  Diabetes history: type 2 Outpatient Diabetes medications: Tresiba 20 units daily, Novolgo 0-9 units correction scale TID, Farxiga 10 mg daily Current orders for Inpatient glycemic control: Levemir 8 units daily, Novolog 0-9 units correction scale TID, Novolog 0-5 units HS scale  Inpatient Diabetes Program Recommendations:   Noted that patient has been having low blood sugars. Recommend changing Novolog correction scale to 0-6 units TID if blood sugars continue to be less than 70 mg/dl.  Harvel Ricks RN BSN CDE Diabetes Coordinator Pager: [phone]  8am-5pm

## 2022-04-26 ENCOUNTER — Encounter: Payer: Self-pay | Admitting: Podiatry

## 2022-04-26 DIAGNOSIS — R7881 Bacteremia: Secondary | ICD-10-CM | POA: Diagnosis not present

## 2022-04-26 DIAGNOSIS — E162 Hypoglycemia, unspecified: Secondary | ICD-10-CM | POA: Diagnosis not present

## 2022-04-26 DIAGNOSIS — M869 Osteomyelitis, unspecified: Secondary | ICD-10-CM | POA: Diagnosis not present

## 2022-04-26 DIAGNOSIS — E1040 Type 1 diabetes mellitus with diabetic neuropathy, unspecified: Secondary | ICD-10-CM | POA: Diagnosis not present

## 2022-04-26 LAB — GLUCOSE, CAPILLARY
Glucose-Capillary: 121 mg/dL — ABNORMAL HIGH (ref 70–99)
Glucose-Capillary: 147 mg/dL — ABNORMAL HIGH (ref 70–99)
Glucose-Capillary: 170 mg/dL — ABNORMAL HIGH (ref 70–99)
Glucose-Capillary: 219 mg/dL — ABNORMAL HIGH (ref 70–99)
Glucose-Capillary: 350 mg/dL — ABNORMAL HIGH (ref 70–99)
Glucose-Capillary: 89 mg/dL (ref 70–99)

## 2022-04-26 LAB — BASIC METABOLIC PANEL
Anion gap: 6 (ref 5–15)
BUN: 18 mg/dL (ref 8–23)
CO2: 25 mmol/L (ref 22–32)
Calcium: 7.7 mg/dL — ABNORMAL LOW (ref 8.9–10.3)
Chloride: 101 mmol/L (ref 98–111)
Creatinine, Ser: 1.07 mg/dL (ref 0.61–1.24)
GFR, Estimated: >60 mL/min (ref >60)
Glucose, Bld: 252 mg/dL — ABNORMAL HIGH (ref 70–99)
Potassium: 4 mmol/L (ref 3.5–5.1)
Sodium: 132 mmol/L — ABNORMAL LOW (ref 135–145)

## 2022-04-26 LAB — CBC
HCT: 25.8 % — ABNORMAL LOW (ref 39.0–52.0)
Hemoglobin: 8.2 g/dL — ABNORMAL LOW (ref 13.0–17.0)
MCH: 29.1 pg (ref 26.0–34.0)
MCHC: 31.8 g/dL (ref 30.0–36.0)
MCV: 91.5 fL (ref 80.0–100.0)
Platelets: 127 10*3/uL — ABNORMAL LOW (ref 150–400)
RBC: 2.82 MIL/uL — ABNORMAL LOW (ref 4.22–5.81)
RDW: 15.3 % (ref 11.5–15.5)
WBC: 2.5 10*3/uL — ABNORMAL LOW (ref 4.0–10.5)
nRBC: 0 % (ref 0.0–0.2)

## 2022-04-26 LAB — PHOSPHORUS: Phosphorus: 2.5 mg/dL (ref 2.5–4.6)

## 2022-04-26 LAB — MAGNESIUM: Magnesium: 1.6 mg/dL — ABNORMAL LOW (ref 1.7–2.4)

## 2022-04-26 MED ORDER — SODIUM CHLORIDE 0.9 % IV SOLN: INTRAVENOUS | Status: DC

## 2022-04-26 MED ORDER — MAGNESIUM SULFATE 2 GM/50ML IV SOLN
2.0000 g | Freq: Once | INTRAVENOUS | Status: AC
  Administered 2022-04-26: 2 g via INTRAVENOUS
  Filled 2022-04-26: qty 50

## 2022-04-26 NOTE — Assessment & Plan Note (Signed)
Related to chronic illness (DM, cocaine abuse) as evidenced by percent weight loss, moderate fat depletion, severe fat depletion, moderate muscle depletion, severe muscle depletion.  --Appreciate dietitian recommendations: -Downgrade diet to dysphagia 2 diet for ease of intake -MVI with minerals daily -500 mg vitamin C BID -220 mg zinc sulfate daily x 14 days -Ensure Enlive po TID, each supplement provides 350 kcal and 20 grams of protein -Mighty Shake TID, each supplement provides 220 kcals and 6 grams protein

## 2022-04-26 NOTE — Progress Notes (Signed)
Progress Note   Patient: Nicholas Weiss PXT:[id number] DOB: 08/12/1950 DOA: 04/21/2022     5 DOS: the patient was seen and examined on 04/26/2022   Brief hospital course: HPI on admission, from ICU H&P: "72 y.o with significant PMH of multiple myeloma, atrial fibrillation, T1DM, with recuirrent DKA admissions,  AV block first-degree, CKD stage III, CVA, chronic anemia with abnormal SPEP, HTN, HLD and cocaine abuse who presented to the ED with chief complaints of hyperglycemia.    Patient report he has been feeling unwell x 1 week and was unable to tolerate po intake or take his medications. He initially reported hypoglycemia however today he complained of nausea and vomiting . He was recently discharged on 0/11/2022 following admission for DKA and COVID treated with Paxlovid.   ED Course: Initial vital signs showed HR of 110 beats/minute, BP 105/50 mm Hg, the RR 16 breaths/minute, and the oxygen saturation 100 % on RA and a temperature of 97.33F (36.5C). Pertinent Labs/Diagnostics Findings: Chemistry:Na+/ K+: 137/7.5 Glucose 897: BUN/Cr.  89/3.88, CO2 8, Anion gap 30 CBC: WBC: 5.9 Hgb/Hct: 10.5/38.0  Other Lab findings: Beta hydroxybutyrate>8.0, Lactic acid: 4.8 COVID PCR: positive on 04/07/22 Venous Blood Gas result:  pO2 51; pCO2 34; pH 7.02;  HCO3 8.8, %O2 Sat 58.1.    The laboratory data showed consistent with patient's well-known recurrent DKA. Urinalysis and chest xray demonstrated no evidence of infection.  Given ECG changes and severe AKI, the management was initiated with initial intravenous fluid, hyperkalemia correction with IV calcium gluconate, and intravenous (IV) bicarbonate push followed by intravenous insulin bolus and infusion as per DKA protocol. Serum electrolytes were closely monitored. PCCM consulted to admit for further management of DKA."  1/24: TRH assumed care.  Pt off insulin drip.  Seen by podiatry for left foot osteomyelitis.  They plan for amputation this admission.  Blood cultures also positive for GPC's, suspect foot is the source.  Changed Augmentin to Unasyn. 1/25: left great toe amputation planned but cancelled by anesthesia due to cocaine+.  ID consulted.  Continuing Unasyn. 1/26: hypoglemic episodes requiring dextrose fluids. Surgery cancelled again, stlil cocain positive UDS this AM.  New symptomatic orthostatic hypotension afternoon, 500 cc bolus and resume maintenance fluids with D5-NS  Assessment and Plan: * DKA, type 1 (HCC) POA, resolved.  Sugars are very labile. Continue Levemir and Novolog Levemir was reduced to 1/2 dose 8 units while awaiting surgery (NPO) Increase Levemir when indicated. Adjust insulin for goal 140-180 Hypoglycemia protocol.  Streptococcal bacteremia See osteomyelitis. Appreciate ID's assistance.  Osteomyelitis of left foot [facility]) Podiatry following, planning for amputation. Surgery delayed a few days due to  positive for cocaine, anesthesia here will no sedate. UDS negative this AM Surgery planned 11 AM today Continue Unasyn given concurrent Streptococcal Bacteremia. Pharmacist started Vancomycin yesterday afternoon due to 1/4 bottle with staph epi.  Stop Vancomycin.  Confirmed with ID on call this weekend. Follow blood cultures and obtain repeats tomorrow Appreciate Infectious Disease input. Echo (TTE) pending. Follow operative cultures once obtained.  Sepsis Ruled Out.  Pt did not meet 2+ SIRS criteria, only tachycardia.  Lactic acidosis due to dehydration with DKA.  Hypoglycemia With NPO status for potential surgery, patient has been having hypoglycemic episodes.  He required D5-NS at 125/hr followed by D10 on AM of 1/26 --Diet resumed post-op yesterday, pt tolerating well --Stop D10 since eating again --Continue close monitoring --Hypoglycemia protocol  Acute renal failure superimposed on stage 3a chronic kidney disease ([place]) Cr on admission 2.96.  Cr today 2.44 >> 1.45 >> 1.37...1.07 Improved with IV  hydration. Likely pre-renal azotemia.  Protein-calorie malnutrition, severe Related to chronic illness (DM, cocaine abuse) as evidenced by percent weight loss, moderate fat depletion, severe fat depletion, moderate muscle depletion, severe muscle depletion.  --Appreciate dietitian recommendations: -Downgrade diet to dysphagia 2 diet for ease of intake -MVI with minerals daily -500 mg vitamin C BID -220 mg zinc sulfate daily x 14 days -Ensure Enlive po TID, each supplement provides 350 kcal and 20 grams of protein -Mighty Shake TID, each supplement provides 220 kcals and 6 grams protein   Hyperkalemia Resolved.  Anemia of chronic disease Hbg stable. Monitor CBC.  Essential (primary) hypertension ACEI/ARC held due to AKI. Continue amlodipine. PRN IV hydralazine  Type 1 diabetes mellitus with diabetic neuropathy, unspecified ([place]) See DKA  History of substance abuse ([place]) UDS positive for cocaine and cannabis. On admission.  Surgery had to be delayed until negative for cocaine.  History of CVA (cerebrovascular accident) Continue statin Hold ASA, Plavix for surgery  Dyslipidemia Continue statin  Multiple myeloma ([place]) -Currently on Revlimid -Follows with oncology -On acyclovir for prophylaxis, needs refills sent at d/c  Sympathotonic orthostatic hypotension Pt BP dropped to 68/48 afternoon 1/26 when standing while bed linens were changed.  Resolved after 500 cc bolus and maintenance fluids. --Continue NS @ 75 cc/hr  --Monitor closely --Fall precautions  Leukopenia Likely due to infection +/- dilution.   WBC 2.2k >> 2.5k Monitor CBC, check diff with AM labs  Diabetic ulcer of toe associated with diabetes mellitus due to underlying condition, with fat layer exposed ([place]) See osteomyelitis  Weight loss Pt reports 24 lb weight loss in past month, very poor appetite.  Appreciate dietitian consultation and recommendations.        Subjective: Pt is POD 1. Denies  significant pain in his foot this AM.  No other acute complaints. Says he feels fine. Asks about when he might be able to go home.       Physical Exam: Vitals:   04/25/22 2005 04/26/22 0425 04/26/22 0433 04/26/22 0819  BP: (!) 141/77 136/87  (!) 149/86  Pulse: 72 71  77  Resp: '18 16  17  '$ Temp: 97.8 F (36.6 C) 99.1 F (37.3 C)  98.2 F (36.8 C)  TempSrc:      SpO2: 100% 100%  100%  Weight:   65.3 kg   Height:       General exam: awake resting in bed, no acute distress HEENT: moist mucus membranes, hearing grossly normal  Respiratory system: on room air, normal respiratory effort. CTAB Cardiovascular system: normal S1/S2, RRRno pedal edema.   Gastrointestinal system: soft, NT, ND Central nervous system: A&O x3. no gross focal neurologic deficits, normal speech Extremities: left foot clean dry intact dressing and ace wrap Skin: dry, intact, normal temperature, normal color Psychiatry: normal mood, congruent affect, judgement and insight appear normal   Data Reviewed:  Notable labs -- Na 132, glucose 252, Ca 7.7, Mg 1.6. WBC 2.5 from 2.2, Hbg 8.2 from 8.6, platelets 127k from 145k   Labile glucose 350 this AM, 89 at lunch time.  Echo (TTE) - showed EF 44-01%, grade 1 diastolic dysfunction, mild LVH of basal/septal segment, no significant valvular disease   Family Communication: girlfriend at bedside on rounds 1/25  Disposition: Status is: Inpatient Remains inpatient appropriate because: Severity of illness remaining on IV antibiotics for bacteremia secondary to osteomyelitis pending blood and operative cultures.      Planned  Discharge Destination: Home    Time spent: 44 minutes  Author: Ezekiel Slocumb, DO 04/26/2022 2:26 PM  For on call review [email or web address].

## 2022-04-26 NOTE — Progress Notes (Signed)
Subjective: 1 Day Post-Op Procedure(s) (LRB): LEFT GREAT TOE AMPUTATION AND LEFT PARTIAL 2ND TOE AMPUTATION (Left) Procedure: Great toe and partial 2nd toe amputation LT DOS: 04/25/2022  Patient resting comfortably in bed. There is a half dollar size strikethrough noted to the dressings at the plantar forefoot. No pain. No complaints at this time. Spouse present this evening.   Objective: Vital signs in last 24 hours: Temp:  [97.8 F (36.6 C)-99.1 F (37.3 C)] 98.2 F (36.8 C) (01/28 0819) Pulse Rate:  [71-77] 77 (01/28 0819) Resp:  [16-18] 17 (01/28 0819) BP: (136-149)/(77-87) 149/86 (01/28 0819) SpO2:  [100 %] 100 % (01/28 0819) Weight:  [65.3 kg] 65.3 kg (01/28 0433)  Recent Labs    04/24/22 0640 04/25/22 0502 04/26/22 0509  HGB 8.9* 8.6* 8.2*   Recent Labs    04/25/22 0502 04/26/22 0509  WBC 2.2* 2.5*  RBC 2.93* 2.82*  HCT 27.0* 25.8*  PLT 145* 127*   Recent Labs    04/25/22 0502 04/26/22 0509  NA 139 132*  K 4.0 4.0  CL 107 101  CO2 24 25  BUN 27* 18  CREATININE 1.14 1.07  GLUCOSE 113* 252*  CALCIUM 7.9* 7.7*    POD#1 LEFT FOOT 04/26/2022  Neurovascular status intact. Incisions well coapted. Sutures intact. There is some minor sanginous oozing from the incision. No active bleeding. Skin warm to touch. No erythema or edema.   Microbiology: 1/22 blood cx - streptococcus parasanguinus       Streptococcus parasanguinis          MIC      CEFTRIAXONE <=0.12 SENS... Sensitive      LEVOFLOXACIN 2 SENSITIVE Sensitive      PENICILLIN 0.12 SENSIT... Sensitive      VANCOMYCIN 0.5 SENSITIVE Sensitive      Left foot wound = group b strep and enterococcaus faecalis  Assessment/Plan: 1 Day Post-Op Procedure(s) (LRB): LEFT GREAT TOE AMPUTATION AND LEFT PARTIAL 2ND TOE AMPUTATION (Left) DOS: 04/25/2022 -Margins seemed very clean intraoperatively. Assuming surgical cure of the osteomyelitis. No bone cultures taken. -Dressings changed. Recommend dressing changes  every other day. Orders placed. -WBAT CAM boot. Order placed for CAM boot -Antibiotics as per Infectious Disease. Always greatly appreciated. Blood cultures positive for Sreptococcus Parasanguinis. Unasyn 3g Q6H.  -From a surgical standpoint patient is okay for discharge. Once discharged leave dressings clean and dry until f/u in office 1 week post discharge.  -Podiatry will sign off  Edrick Kins 04/26/2022, 5:03 PM  Edrick Kins, DPM [facility]  Dr. Edrick Kins, DPM    [address], [place] [address]                Office [phone]  Fax [phone]

## 2022-04-26 NOTE — Progress Notes (Signed)
POD 1 great + second toe amputation. Patient got  out of bed to bathroom without calling for help. Patient got  back to bed and called nurse  reporting Left foot bleeding. bright red blood noted on plantar  L foot. blood saturated through ace wrap. IV dislodge out of Left forearm. MD notified.

## 2022-04-27 DIAGNOSIS — M869 Osteomyelitis, unspecified: Secondary | ICD-10-CM | POA: Diagnosis not present

## 2022-04-27 DIAGNOSIS — L97522 Non-pressure chronic ulcer of other part of left foot with fat layer exposed: Secondary | ICD-10-CM | POA: Diagnosis not present

## 2022-04-27 DIAGNOSIS — E1040 Type 1 diabetes mellitus with diabetic neuropathy, unspecified: Secondary | ICD-10-CM | POA: Diagnosis not present

## 2022-04-27 DIAGNOSIS — C9000 Multiple myeloma not having achieved remission: Secondary | ICD-10-CM

## 2022-04-27 DIAGNOSIS — R7881 Bacteremia: Secondary | ICD-10-CM | POA: Diagnosis not present

## 2022-04-27 DIAGNOSIS — E08621 Diabetes mellitus due to underlying condition with foot ulcer: Secondary | ICD-10-CM | POA: Diagnosis not present

## 2022-04-27 DIAGNOSIS — E1069 Type 1 diabetes mellitus with other specified complication: Secondary | ICD-10-CM

## 2022-04-27 DIAGNOSIS — M8618 Other acute osteomyelitis, other site: Secondary | ICD-10-CM

## 2022-04-27 DIAGNOSIS — I951 Orthostatic hypotension: Secondary | ICD-10-CM | POA: Diagnosis not present

## 2022-04-27 LAB — BASIC METABOLIC PANEL
Anion gap: 5 (ref 5–15)
BUN: 17 mg/dL (ref 8–23)
CO2: 26 mmol/L (ref 22–32)
Calcium: 7.5 mg/dL — ABNORMAL LOW (ref 8.9–10.3)
Chloride: 104 mmol/L (ref 98–111)
Creatinine, Ser: 1 mg/dL (ref 0.61–1.24)
GFR, Estimated: >60 mL/min (ref >60)
Glucose, Bld: 165 mg/dL — ABNORMAL HIGH (ref 70–99)
Potassium: 4.2 mmol/L (ref 3.5–5.1)
Sodium: 135 mmol/L (ref 135–145)

## 2022-04-27 LAB — GLUCOSE, CAPILLARY
Glucose-Capillary: 144 mg/dL — ABNORMAL HIGH (ref 70–99)
Glucose-Capillary: 166 mg/dL — ABNORMAL HIGH (ref 70–99)
Glucose-Capillary: 207 mg/dL — ABNORMAL HIGH (ref 70–99)
Glucose-Capillary: 208 mg/dL — ABNORMAL HIGH (ref 70–99)
Glucose-Capillary: 210 mg/dL — ABNORMAL HIGH (ref 70–99)
Glucose-Capillary: 248 mg/dL — ABNORMAL HIGH (ref 70–99)

## 2022-04-27 LAB — CBC
HCT: 24.4 % — ABNORMAL LOW (ref 39.0–52.0)
Hemoglobin: 7.6 g/dL — ABNORMAL LOW (ref 13.0–17.0)
MCH: 28.4 pg (ref 26.0–34.0)
MCHC: 31.1 g/dL (ref 30.0–36.0)
MCV: 91 fL (ref 80.0–100.0)
Platelets: 133 10*3/uL — ABNORMAL LOW (ref 150–400)
RBC: 2.68 MIL/uL — ABNORMAL LOW (ref 4.22–5.81)
RDW: 15.2 % (ref 11.5–15.5)
WBC: 2.4 10*3/uL — ABNORMAL LOW (ref 4.0–10.5)
nRBC: 0 % (ref 0.0–0.2)

## 2022-04-27 LAB — MAGNESIUM: Magnesium: 1.9 mg/dL (ref 1.7–2.4)

## 2022-04-27 MED ORDER — ENOXAPARIN SODIUM 40 MG/0.4ML IJ SOSY
40.0000 mg | PREFILLED_SYRINGE | Freq: Every day | INTRAMUSCULAR | Status: DC
  Administered 2022-04-27 – 2022-04-28 (×2): 40 mg via SUBCUTANEOUS
  Filled 2022-04-27 (×3): qty 0.4

## 2022-04-27 MED ORDER — ASPIRIN 81 MG PO TBEC
81.0000 mg | DELAYED_RELEASE_TABLET | Freq: Every day | ORAL | Status: DC
  Administered 2022-04-27 – 2022-04-29 (×3): 81 mg via ORAL
  Filled 2022-04-27 (×3): qty 1

## 2022-04-27 NOTE — Progress Notes (Signed)
Progress Note   Patient: Nicholas Weiss [id number]:[id number] DOB: 1950-09-02 DOA: 04/21/2022     6 DOS: the patient was seen and examined on 04/27/2022   Brief hospital course: HPI on admission, from ICU H&P: "72 y.o with significant PMH of multiple myeloma, atrial fibrillation, T1DM, with recuirrent DKA admissions,  AV block first-degree, CKD stage III, CVA, chronic anemia with abnormal SPEP, HTN, HLD and cocaine abuse who presented to the ED with chief complaints of hyperglycemia.    Patient report he has been feeling unwell x 1 week and was unable to tolerate po intake or take his medications. He initially reported hypoglycemia however today he complained of nausea and vomiting . He was recently discharged on 0/11/2022 following admission for DKA and COVID treated with Paxlovid.   ED Course: Initial vital signs showed HR of 110 beats/minute, BP 105/50 mm Hg, the RR 16 breaths/minute, and the oxygen saturation 100 % on RA and a temperature of 97.36F (36.5C). Pertinent Labs/Diagnostics Findings: Chemistry:Na+/ K+: 137/7.5 Glucose 897: BUN/Cr.  89/3.88, CO2 8, Anion gap 30 CBC: WBC: 5.9 Hgb/Hct: 10.5/38.0  Other Lab findings: Beta hydroxybutyrate>8.0, Lactic acid: 4.8 COVID PCR: positive on 04/07/22 Venous Blood Gas result:  pO2 51; pCO2 34; pH 7.02;  HCO3 8.8, %O2 Sat 58.1.    The laboratory data showed consistent with patient's well-known recurrent DKA. Urinalysis and chest xray demonstrated no evidence of infection.  Given ECG changes and severe AKI, the management was initiated with initial intravenous fluid, hyperkalemia correction with IV calcium gluconate, and intravenous (IV) bicarbonate push followed by intravenous insulin bolus and infusion as per DKA protocol. Serum electrolytes were closely monitored. PCCM consulted to admit for further management of DKA."  1/24: TRH assumed care.  Pt off insulin drip.  Seen by podiatry for left foot osteomyelitis.  They plan for amputation this admission.  Blood cultures also positive for GPC's, suspect foot is the source.  Changed Augmentin to Unasyn. 1/25: left great toe amputation planned but cancelled by anesthesia due to cocaine+.  ID consulted.  Continuing Unasyn. 1/26: hypoglemic episodes requiring dextrose fluids. Surgery cancelled again, stlil cocain positive UDS this AM.  New symptomatic orthostatic hypotension afternoon, 500 cc bolus and resume maintenance fluids with D5-NS  Assessment and Plan: * DKA, type 1 (HCC) POA, resolved.  Sugars are very labile. Continue Levemir and Novolog Levemir was reduced to 1/2 dose 8 units while awaiting surgery (NPO) Increase Levemir when indicated. Adjust insulin for goal 140-180 Hypoglycemia protocol.  Streptococcal bacteremia See osteomyelitis. Appreciate ID's assistance.  Osteomyelitis of left foot [facility]) Podiatry following, planning for amputation. Surgery was delayed a few days due to  positive for cocaine, anesthesia here will not sedate. Underwent amputation 1/27 Continue Unasyn given concurrent Streptococcal Bacteremia. Follow repeat blood cultures. Appreciate Infectious Disease input. Echo negative for vegatation. Follow surgical pathology. ABI's in Dec 2024 were normal. ID plans 4 weeks antibiotics after day of surgery (1/27 through 2/24). Continue Unasyn. Discharge on Augmentin. Wound care Optimize glycemic control  Sepsis Ruled Out.  Pt did not meet 2+ SIRS criteria, only tachycardia.  Lactic acidosis due to dehydration with DKA.  Hypoglycemia With NPO status for potential surgery, patient has been having hypoglycemic episodes.  He required D5-NS at 125/hr followed by D10 on AM of 1/26 --Required dextrose fluids due to recurrent hypoglycemic episodes with NPO perioperatively --Continue close monitoring --Hypoglycemia protocol  Acute renal failure superimposed on stage 3a chronic kidney disease ([place]) Cr on admission 2.96. Cr today 2.44 >> 1.45 >>  1.37...1.07 Improved  with IV hydration. Likely pre-renal azotemia.  Protein-calorie malnutrition, severe Related to chronic illness (DM, cocaine abuse) as evidenced by percent weight loss, moderate fat depletion, severe fat depletion, moderate muscle depletion, severe muscle depletion.  --Appreciate dietitian recommendations: -Downgrade diet to dysphagia 2 diet for ease of intake -MVI with minerals daily -500 mg vitamin C BID -220 mg zinc sulfate daily x 14 days -Ensure Enlive po TID, each supplement provides 350 kcal and 20 grams of protein -Mighty Shake TID, each supplement provides 220 kcals and 6 grams protein   Hyperkalemia Resolved.  Anemia of chronic disease Hbg stable. Monitor CBC.  Essential (primary) hypertension ACEI/ARC held due to AKI. Continue amlodipine. PRN IV hydralazine  Type 1 diabetes mellitus with diabetic neuropathy, unspecified ([place]) See DKA  History of substance abuse ([place]) UDS positive for cocaine and cannabis. On admission.  Surgery had to be delayed until negative for cocaine.  History of CVA (cerebrovascular accident) Continue statin Held ASA, Plavix for surgery Resume ASA today Likely resume Plavix tomorrow if no further bleeding and Hbg stable/improved.  Dyslipidemia Continue statin  Multiple myeloma (HCC) -Currently on Revlimid -Follows with oncology -On acyclovir for prophylaxis, needs refills sent at d/c  Sympathotonic orthostatic hypotension Pt BP dropped to 68/48 afternoon 1/26 when standing while bed linens were changed.  Resolved after 500 cc bolus and maintenance fluids. --Stop IV fluids --Monitor closely --Fall precautions  Leukopenia Likely due to infection +/- dilution.   WBC 2.2k >> 2.5k >> 2.4 Monitor CBC, check diff with AM labs  Diabetic ulcer of toe associated with diabetes mellitus due to underlying condition, with fat layer exposed ([place]) See osteomyelitis  Weight loss Pt reports 24 lb weight loss in past month, very poor  appetite.  Appreciate dietitian consultation and recommendations.        Subjective: Pt is POD 2.  He had bleeding from the foot yesterday after getting up and walked to bathroom without assistance.  Bleeding was stopped and no recurrence.  Pt reports feeling okay today, has no acute complaints.      Physical Exam: Vitals:   04/26/22 2047 04/27/22 0309 04/27/22 0543 04/27/22 0831  BP: 136/77  (!) 140/79 (!) 140/83  Pulse: 79  71 68  Resp: '18  18 18  '$ Temp: 98.5 F (36.9 C)  98.6 F (37 C) 98.2 F (36.8 C)  TempSrc: Oral     SpO2: 99%  100% 100%  Weight:  69.1 kg    Height:       General exam: awake resting in bed, no acute distress HEENT: moist mucus membranes, hearing grossly normal  Respiratory system: on room air, normal respiratory effort. CTAB Cardiovascular system: normal S1/S2, RRRno pedal edema.   Gastrointestinal system: soft, NT, ND Central nervous system: A&O x3. no gross focal neurologic deficits, normal speech Extremities: left foot clean dry intact dressing and ace wrap Skin: dry, intact, normal temperature, normal color Psychiatry: normal mood, congruent affect, judgement and insight appear normal   Data Reviewed:  Notable labs -- BMP with glucose 165, Ca 7.5 otherwise normal.  CBC with WBC 2.4, Hbg 7.6 from 8.2. platelets 133 from 127  Echo (TTE) - showed EF 71-69%, grade 1 diastolic dysfunction, mild LVH of basal/septal segment, no significant valvular disease   Family Communication: girlfriend at bedside on rounds 1/25  Disposition: Status is: Inpatient Remains inpatient appropriate because: remains on IV antibiotics for bacteremia secondary to osteomyelitis pending definitive antibiotic plan for d/c.    Planned Discharge  Destination: Home    Time spent: 36 minutes  Author: Ezekiel Slocumb, DO 04/27/2022 1:08 PM  For on call review [email or web address].

## 2022-04-27 NOTE — Progress Notes (Signed)
[facility] for Infectious Disease  Date of Admission:  04/21/2022           Reason for visit: Follow up on bacteremia and OM  Current antibiotics: Unasyn Valtrex   ASSESSMENT:    72 y.o. male admitted with:  # Strep parasanguinis bacteremia due to DFU/OM # DFU/Osteomyelitis of the left foot s/p partial amputation 04/25/22 # Multiple myeloma # Uncontrolled DM with A1c 9.1 in Dec 2023  - Blood cx positive 1/23, cleared 1/26 - TTE negative for vegetation - Left great toe amputation, partial 2nd toe amputation 04/25/22 with podiatry.  Op note reviewed indicates clean margins and assumption of surgical eradication and source control of OM.  Surgical pathology is pending.   RECOMMENDATIONS:    Continue Unasyn Await repeat blood cx to ensure clearance Follow up surgical pathology In the setting of bacteremia, immunocompromise, poorly controlled DM, and minor amputation; would favor an extended antibiotic course of 4 weeks from date of OR on 04/25/22 through 05/23/22 as I think it can be difficult to discern that all osteomyelitic/necrotic bone was eradicated.  Pathology is also pending.  Would favor continuing IV Unasyn inpatient and transition to Augmentin at discharge to complete therapy Will order ABI to ensure there is adequate blood flow and that this does not pose a hindrance to wound healing Wound care, glycemic control   Will follow  Principal Problem:   DKA, type 1 ([place]) Active Problems:   Type 1 diabetes mellitus with diabetic neuropathy, unspecified ([place])   Essential (primary) hypertension   Acute renal failure superimposed on stage 3a chronic kidney disease ([place])   Hypoglycemia   Multiple myeloma ([place])   Anemia of chronic disease   Dyslipidemia   History of CVA (cerebrovascular accident)   History of substance abuse ([place])   Hyperkalemia   Osteomyelitis of left foot ([place])   Weight loss   Protein-calorie malnutrition, severe   Diabetic ulcer of toe  associated with diabetes mellitus due to underlying condition, with fat layer exposed ([place])   Streptococcal bacteremia   Leukopenia   History of multiple myeloma   Sympathotonic orthostatic hypotension    MEDICATIONS:    Scheduled Meds:  acyclovir  400 mg Oral BID   amLODipine  10 mg Oral Daily   vitamin C  500 mg Oral BID   feeding supplement  237 mL Oral TID BM   heparin  5,000 Units Subcutaneous Q8H   insulin aspart  0-5 Units Subcutaneous QHS   insulin aspart  0-9 Units Subcutaneous TID WC   insulin detemir  8 Units Subcutaneous Daily   multivitamin with minerals  1 tablet Oral Daily   zinc sulfate  220 mg Oral Daily   Continuous Infusions:  sodium chloride 75 mL/hr at 04/26/22 0831   ampicillin-sulbactam (UNASYN) IV 3 g (04/27/22 0552)   promethazine (PHENERGAN) injection (IM or IVPB)     PRN Meds:.acetaminophen, dextrose, docusate sodium, hydrALAZINE, HYDROcodone-acetaminophen, ondansetron (ZOFRAN) IV, polyethylene glycol, promethazine (PHENERGAN) injection (IM or IVPB)  SUBJECTIVE:   24 hour events:  No major events are noted Repeat cx NGTD Labs this AM appear largely stable Afebrile Remains on Unasyn for foot infection complicated by bacteremia  Patient with no acute complaints.  Reports his pain is controlled.  He has a little bit of diarrhea and is asking to urinate at the moment.   Review of Systems  All other systems reviewed and are negative.     OBJECTIVE:   Blood pressure Marland Kitchen)  140/79, pulse 71, temperature 98.6 F (37 C), resp. rate 18, height '5\' 10"'$  (1.778 m), weight 69.1 kg, SpO2 100 %. Body mass index is 21.86 kg/m.  Physical Exam Constitutional:      Appearance: Normal appearance.  Pulmonary:     Effort: Pulmonary effort is normal. No respiratory distress.  Abdominal:     General: There is no distension.     Palpations: Abdomen is soft.  Musculoskeletal:     Comments: S/p left great toe and 2nd toe partial amputation.  Incision is  wrapped.   Skin:    General: Skin is warm and dry.  Neurological:     General: No focal deficit present.     Mental Status: He is alert and oriented to person, place, and time.  Psychiatric:        Mood and Affect: Mood normal.        Behavior: Behavior normal.      Lab Results: Lab Results  Component Value Date   WBC 2.4 (L) 04/27/2022   HGB 7.6 (L) 04/27/2022   HCT 24.4 (L) 04/27/2022   MCV 91.0 04/27/2022   PLT 133 (L) 04/27/2022    Lab Results  Component Value Date   NA 135 04/27/2022   K 4.2 04/27/2022   CO2 26 04/27/2022   GLUCOSE 165 (H) 04/27/2022   BUN 17 04/27/2022   CREATININE 1.00 04/27/2022   CALCIUM 7.5 (L) 04/27/2022   GFRNONAA >60 04/27/2022   GFRAA 54 (L) 03/15/2020    Lab Results  Component Value Date   ALT 16 04/17/2022   AST 14 (L) 04/17/2022   ALKPHOS 98 04/17/2022   BILITOT 0.6 04/17/2022       Component Value Date/Time   CRP <0.5 04/08/2022 0438       Component Value Date/Time   ESRSEDRATE >140 (H) 08/09/2021 2252     I have reviewed the micro and lab results in Epic.  Imaging: DG Foot Complete Left  Result Date: 04/25/2022 CLINICAL DATA:  Status post left great toe amputation and partial second toe amputation. EXAM: LEFT FOOT - COMPLETE 3+ VIEW COMPARISON:  Preoperative imaging. FINDINGS: Interval resection of the great toe. Interval resection of the second toe at the distal aspect of the proximal phalanx. Expected postsurgical change in the overlying soft tissues. No acute fracture. The exam is otherwise unchanged. IMPRESSION: Interval resection of the great toe. Interval resection of the second toe at the distal aspect of the proximal phalanx. Electronically Signed   By: Keith Rake M.D.   On: 04/25/2022 18:36     Imaging independently reviewed in Epic.    Rayn[facility] for Infectious [facility] [phone] pager 04/27/2022, 7:32 AM

## 2022-04-28 DIAGNOSIS — M869 Osteomyelitis, unspecified: Secondary | ICD-10-CM | POA: Diagnosis not present

## 2022-04-28 DIAGNOSIS — L97502 Non-pressure chronic ulcer of other part of unspecified foot with fat layer exposed: Secondary | ICD-10-CM | POA: Diagnosis not present

## 2022-04-28 DIAGNOSIS — E08621 Diabetes mellitus due to underlying condition with foot ulcer: Secondary | ICD-10-CM | POA: Diagnosis not present

## 2022-04-28 DIAGNOSIS — R7881 Bacteremia: Secondary | ICD-10-CM | POA: Diagnosis not present

## 2022-04-28 DIAGNOSIS — D638 Anemia in other chronic diseases classified elsewhere: Secondary | ICD-10-CM

## 2022-04-28 DIAGNOSIS — E1040 Type 1 diabetes mellitus with diabetic neuropathy, unspecified: Secondary | ICD-10-CM | POA: Diagnosis not present

## 2022-04-28 LAB — CBC WITH DIFFERENTIAL/PLATELET
Abs Immature Granulocytes: 0 10*3/uL (ref 0.00–0.07)
Basophils Absolute: 0 10*3/uL (ref 0.0–0.1)
Basophils Relative: 0 %
Eosinophils Absolute: 0.2 10*3/uL (ref 0.0–0.5)
Eosinophils Relative: 7 %
HCT: 24 % — ABNORMAL LOW (ref 39.0–52.0)
Hemoglobin: 7.7 g/dL — ABNORMAL LOW (ref 13.0–17.0)
Immature Granulocytes: 0 %
Lymphocytes Relative: 42 %
Lymphs Abs: 1.1 10*3/uL (ref 0.7–4.0)
MCH: 29.4 pg (ref 26.0–34.0)
MCHC: 32.1 g/dL (ref 30.0–36.0)
MCV: 91.6 fL (ref 80.0–100.0)
Monocytes Absolute: 0.4 10*3/uL (ref 0.1–1.0)
Monocytes Relative: 14 %
Neutro Abs: 1 10*3/uL — ABNORMAL LOW (ref 1.7–7.7)
Neutrophils Relative %: 37 %
Platelets: 144 10*3/uL — ABNORMAL LOW (ref 150–400)
RBC: 2.62 MIL/uL — ABNORMAL LOW (ref 4.22–5.81)
RDW: 15.3 % (ref 11.5–15.5)
WBC: 2.6 10*3/uL — ABNORMAL LOW (ref 4.0–10.5)
nRBC: 0 % (ref 0.0–0.2)

## 2022-04-28 LAB — PHOSPHORUS: Phosphorus: 2.3 mg/dL — ABNORMAL LOW (ref 2.5–4.6)

## 2022-04-28 LAB — BASIC METABOLIC PANEL
Anion gap: 3 — ABNORMAL LOW (ref 5–15)
BUN: 22 mg/dL (ref 8–23)
CO2: 28 mmol/L (ref 22–32)
Calcium: 7.5 mg/dL — ABNORMAL LOW (ref 8.9–10.3)
Chloride: 101 mmol/L (ref 98–111)
Creatinine, Ser: 1.05 mg/dL (ref 0.61–1.24)
GFR, Estimated: >60 mL/min (ref >60)
Glucose, Bld: 237 mg/dL — ABNORMAL HIGH (ref 70–99)
Potassium: 4.6 mmol/L (ref 3.5–5.1)
Sodium: 132 mmol/L — ABNORMAL LOW (ref 135–145)

## 2022-04-28 LAB — GLUCOSE, CAPILLARY
Glucose-Capillary: 227 mg/dL — ABNORMAL HIGH (ref 70–99)
Glucose-Capillary: 234 mg/dL — ABNORMAL HIGH (ref 70–99)
Glucose-Capillary: 494 mg/dL — ABNORMAL HIGH (ref 70–99)
Glucose-Capillary: 430 mg/dL — ABNORMAL HIGH (ref 70–99)
Glucose-Capillary: 92 mg/dL (ref 70–99)
Glucose-Capillary: 115 mg/dL — ABNORMAL HIGH (ref 70–99)

## 2022-04-28 LAB — ALBUMIN: Albumin: 2.2 g/dL — ABNORMAL LOW (ref 3.5–5.0)

## 2022-04-28 MED ORDER — POTASSIUM & SODIUM PHOSPHATES 280-160-250 MG PO PACK
1.0000 | PACK | Freq: Three times a day (TID) | ORAL | Status: AC
  Administered 2022-04-28 (×4): 1 via ORAL
  Filled 2022-04-28 (×4): qty 1

## 2022-04-28 MED ORDER — INSULIN DETEMIR 100 UNIT/ML ~~LOC~~ SOLN
12.0000 [IU] | Freq: Every day | SUBCUTANEOUS | Status: DC
  Administered 2022-04-29: 12 [IU] via SUBCUTANEOUS
  Filled 2022-04-28: qty 0.12

## 2022-04-28 MED ORDER — AMLODIPINE BESYLATE 5 MG PO TABS
5.0000 mg | ORAL_TABLET | Freq: Every day | ORAL | Status: DC
  Administered 2022-04-28 – 2022-04-29 (×2): 5 mg via ORAL
  Filled 2022-04-28 (×2): qty 1

## 2022-04-28 NOTE — Inpatient Diabetes Management (Signed)
Inpatient Diabetes Program Recommendations  AACE/ADA: New Consensus Statement on Inpatient Glycemic Control   Target Ranges:  Prepandial:   less than 140 mg/dL      Peak postprandial:   less than 180 mg/dL (1-2 hours)      Critically ill patients:  140 - 180 mg/dL    Latest Reference Range & Units 04/27/22 08:30 04/27/22 11:36 04/27/22 17:34 04/27/22 21:21 04/27/22 23:44 04/28/22 04:46  Glucose-Capillary 70 - 99 mg/dL 144 (H) 248 (H) 166 (H) 207 (H) 208 (H) 227 (H)   Review of Glycemic Control  Diabetes history: DM1 Outpatient Diabetes medications: Tresiba 20 units daily, Novolog 0-9 units TID, Farxiga 10 mg daily Current orders for Inpatient glycemic control: Levemir 8 units daily, Novolog 0-9 units TID with meals, Novolog 0-5 units QHS  Inpatient Diabetes Program Recommendations:    Insulin: In reviewing chart, per [facility] patient refused Novolog correction at 8am, 5pm, and 10pm on 04/27/22. As a result, glucose has been trending higher and fasting CBG 227 mg/dl this morning. Please encourage patient to take Novolog correction insulin as ordered.  Thanks, Barnie Alderman, RN, MSN, [place] [facility] Coordinator Inpatient Diabetes Program [phone] (Team Pager from 8am to [place])

## 2022-04-28 NOTE — Progress Notes (Addendum)
Progress Note   Patient: Nicholas Weiss NFA:[id number] DOB: 1951-03-24 DOA: 04/21/2022     7 DOS: the patient was seen and examined on 04/28/2022   Brief hospital course: HPI on admission, from ICU H&P: "72 y.o with significant PMH of multiple myeloma, atrial fibrillation, T1DM, with recuirrent DKA admissions,  AV block first-degree, CKD stage III, CVA, chronic anemia with abnormal SPEP, HTN, HLD and cocaine abuse who presented to the ED with chief complaints of hyperglycemia.    Patient report he has been feeling unwell x 1 week and was unable to tolerate po intake or take his medications. He initially reported hypoglycemia however today he complained of nausea and vomiting . He was recently discharged on 0/11/2022 following admission for DKA and COVID treated with Paxlovid.   ED Course: Initial vital signs showed HR of 110 beats/minute, BP 105/50 mm Hg, the RR 16 breaths/minute, and the oxygen saturation 100 % on RA and a temperature of 97.85F (36.5C). Pertinent Labs/Diagnostics Findings: Chemistry:Na+/ K+: 137/7.5 Glucose 897: BUN/Cr.  89/3.88, CO2 8, Anion gap 30 CBC: WBC: 5.9 Hgb/Hct: 10.5/38.0  Other Lab findings: Beta hydroxybutyrate>8.0, Lactic acid: 4.8 COVID PCR: positive on 04/07/22 Venous Blood Gas result:  pO2 51; pCO2 34; pH 7.02;  HCO3 8.8, %O2 Sat 58.1.    The laboratory data showed consistent with patient's well-known recurrent DKA. Urinalysis and chest xray demonstrated no evidence of infection.  Given ECG changes and severe AKI, the management was initiated with initial intravenous fluid, hyperkalemia correction with IV calcium gluconate, and intravenous (IV) bicarbonate push followed by intravenous insulin bolus and infusion as per DKA protocol. Serum electrolytes were closely monitored. PCCM consulted to admit for further management of DKA."  1/24: TR[facility] assumed care.  Pt off insulin drip.  Seen by podiatry for left foot osteomyelitis.  They plan for amputation this admission.  Blood cultures also positive for GPC's, suspect foot is the source.  Changed Augmentin to Unasyn. 1/25: left great toe amputation planned but cancelled by anesthesia due to cocaine+.  ID consulted.  Continuing Unasyn. 1/26: hypoglemic episodes requiring dextrose fluids. Surgery cancelled again, stlil cocain positive UDS this AM.  New symptomatic orthostatic hypotension afternoon, 500 cc bolus and resume maintenance fluids with D5-NS  Assessment and Plan: * DKA, type 1 (HCC) POA, resolved.  Sugars are very labile. Continue Levemir and Novolog Levemir was reduced to 1/2 dose 8 units while awaiting surgery (NPO) Increase Levemir to 12 units tonight and further as sugars tolerate. Adjust insulin for goal 140-180 Hypoglycemia protocol.  Streptococcal bacteremia See osteomyelitis. Appreciate ID's assistance.  Osteomyelitis of left foot [facility]) Podiatry consulted, Surgery was delayed a few days due to  positive for cocaine, anesthesia here will not sedate.  Finally underwent amputation 1/27. --Continue Unasyn given concurrent Streptococcal Bacteremia. Follow repeat blood cultures. Appreciate Infectious Disease input. Echo negative for vegatation. --Follow surgical pathology. --ABI's in Dec 2024 were normal. --ID plans 4 weeks antibiotics after day of surgery (1/27 through 2/24). --Continue Unasyn. --Discharge on Augmentin. --Wound care --Optimize glycemic control --PT evaluation  Sepsis Ruled Out.  Pt did not meet 2+ SIRS criteria, only tachycardia.  Lactic acidosis due to dehydration with DKA.  Hypoglycemia With NPO status for potential surgery, patient has been having hypoglycemic episodes.  He required D5-NS at 125/hr followed by D10 on AM of 1/26 --Required dextrose fluids due to recurrent hypoglycemic episodes with NPO perioperatively --Continue close monitoring --Hypoglycemia protocol  Acute renal failure superimposed on stage 3a chronic kidney disease ([place]) Cr  on admission  2.96. Cr today 2.44 >> 1.45 >> 1.37...1.05 Improved with IV hydration. Likely pre-renal azotemia.  Protein-calorie malnutrition, severe Related to chronic illness (DM, cocaine abuse) as evidenced by percent weight loss, moderate fat depletion, severe fat depletion, moderate muscle depletion, severe muscle depletion.  --Appreciate dietitian recommendations: -Downgrade diet to dysphagia 2 diet for ease of intake -MVI with minerals daily -500 mg vitamin C BID -220 mg zinc sulfate daily x 14 days -Ensure Enlive po TID, each supplement provides 350 kcal and 20 grams of protein -Mighty Shake TID, each supplement provides 220 kcals and 6 grams protein   Hyperkalemia Resolved.  Anemia of chronic disease Hbg down a bit since surgery, but stable. Monitor CBC. Transfuse if Hbg < 7 Last Hbg 7.7  Essential (primary) hypertension ACEI/ARC held due to AKI. Continue amlodipine. PRN IV hydralazine  Type 1 diabetes mellitus with diabetic neuropathy, unspecified ([place]) See DKA  History of substance abuse ([place]) UDS positive for cocaine and cannabis. On admission.  Surgery had to be delayed until negative for cocaine.  History of CVA (cerebrovascular accident) Continue statin Held ASA, Plavix for surgery Resume ASA today Likely resume Plavix tomorrow if no further bleeding and Hbg stable/improved.  Dyslipidemia Continue statin  Multiple myeloma (HCC) -Currently on Revlimid -Follows with oncology -On acyclovir for prophylaxis, needs refills sent at d/c  Hypophosphatemia Phos 2.3 this AM. Replace with PO phos-nak packets x 1 day Repeat phos with AM labs  Sympathotonic orthostatic hypotension Pt BP dropped to 68/48 afternoon 1/26 when standing while bed linens were changed.  Resolved after 500 cc bolus and maintenance fluids. --Stop IV fluids --Monitor closely --Fall precautions  History of multiple myeloma .  Leukopenia Likely due to infection +/- dilution.   WBC 2.2k >>  2.5k >> 2.4 >> 2.6 Monitor CBC, check diff with AM labs  Diabetic ulcer of toe associated with diabetes mellitus due to underlying condition, with fat layer exposed ([place]) See osteomyelitis  Weight loss Pt reports 24 lb weight loss in past month, very poor appetite.  Appreciate dietitian consultation and recommendations.        Subjective: Pt is POD 3.  Doing okay this AM, seen up in recliner, appears drowsy.  He denies acute complaints.  He reportedly refused insulin last night and has higher sugars this AM as a result.  Has not been up to ambulate much.  Agreeable to work with PT today.     Physical Exam: Vitals:   04/27/22 1942 04/28/22 0447 04/28/22 0500 04/28/22 0806  BP: (!) 104/54 118/67  (!) 148/79  Pulse: 77 69  71  Resp: '18 17  20  '$ Temp: 97.7 F (36.5 C) 98.4 F (36.9 C)  97.9 F (36.6 C)  TempSrc: Oral Oral    SpO2: 100% 100%  100%  Weight:   69.7 kg   Height:       General exam: awake resting in bed, no acute distress HEENT: moist mucus membranes, hearing grossly normal  Respiratory system: on room air, normal respiratory effort. CTAB Cardiovascular system: normal S1/S2, RRRno pedal edema.   Gastrointestinal system: soft, NT, ND Central nervous system: A&O x3. no gross focal neurologic deficits, normal speech Extremities: left foot clean dry intact dressing and ace wrap Skin: dry, intact, normal temperature, normal color Psychiatry: normal mood, congruent affect, judgement and insight appear normal   Data Reviewed:  Notable labs -- BMP with Na 132, glucose 237, Ca 7.5, albumin 2.2, gap 3, phos 2.3.  Hbg 7.7 from  7.6, WBC improved 2.6 from 2.4k, platelets improved 144k from 133  Echo (TTE) - showed EF 16-60%, grade 1 diastolic dysfunction, mild LVH of basal/septal segment, no significant valvular disease   Family Communication: girlfriend at bedside on rounds 1/25  Disposition: Status is: Inpatient Remains inpatient appropriate because: remains on IV  antibiotics for bacteremia secondary to osteomyelitis pending definitive antibiotic plan for d/c.  Awaiting surgical pathology results and PT evaluation for dispo planning.    Planned Discharge Destination: Home    Time spent: 36 minutes  Author: Ezekiel Slocumb, DO 04/28/2022 12:38 PM  For on call review [email or web address].

## 2022-04-28 NOTE — Progress Notes (Addendum)
[facility] for Infectious Disease  Date of Admission:  04/21/2022           Reason for visit: Follow up on bacteremia and OM  Current antibiotics: Unasyn Valtrex  ASSESSMENT:    72 y.o. male admitted with:  Strep parasanguinis bacteremia: Due to DFU/OM of the left foot s/p partial amputation 04/25/22.  Repeat blood cultures 1/26 are NGTD and patient stable on Unasyn.  Op note reviewed indicates clean margins and assumption of surgical eradication and source control of OM.  Surgical pathology is pending.  Uncontrolled DM type 1: A1c in Dec 2023 was 9.1. Multiple myeloma  RECOMMENDATIONS:    Continue Unasyn while inpatient and transition to Augmentin at discharge In the setting of bacteremia, immunocompromise, poorly controlled DM, and minor amputation; would favor an extended antibiotic course of 4 weeks from date of OR on 04/25/22 through 05/23/22 as I think it can be difficult to discern that all osteomyelitic/necrotic bone was eradicated and he likely has microvascular disease despite normal ABI in Dec 2023 that may be a hindrance to wound healing. Wound care, glycemic control He prefers to follow up in [place] so will arrange follow up with Dr Delaine Lame on 05/19/22 at [facility]. Will sign off, please call as needed.    Principal Problem:   DKA, type 1 ([place]) Active Problems:   Type 1 diabetes mellitus with diabetic neuropathy, unspecified (HCC)   Essential (primary) hypertension   Acute renal failure superimposed on stage 3a chronic kidney disease (HCC)   Hypoglycemia   Multiple myeloma (HCC)   Anemia of chronic disease   Dyslipidemia   History of CVA (cerebrovascular accident)   History of substance abuse ([place])   Hyperkalemia   Osteomyelitis of left foot (HCC)   Weight loss   Protein-calorie malnutrition, severe   Diabetic ulcer of toe associated with diabetes mellitus due to underlying condition, with fat layer exposed ([place])   Streptococcal bacteremia    Leukopenia   History of multiple myeloma   Sympathotonic orthostatic hypotension    MEDICATIONS:    Scheduled Meds:  acyclovir  400 mg Oral BID   amLODipine  5 mg Oral Daily   vitamin C  500 mg Oral BID   aspirin EC  81 mg Oral Daily   enoxaparin (LOVENOX) injection  40 mg Subcutaneous QHS   feeding supplement  237 mL Oral TID BM   insulin aspart  0-5 Units Subcutaneous QHS   insulin aspart  0-9 Units Subcutaneous TID WC   insulin detemir  8 Units Subcutaneous Daily   multivitamin with minerals  1 tablet Oral Daily   potassium & sodium phosphates  1 packet Oral TID AC & HS   zinc sulfate  220 mg Oral Daily   Continuous Infusions:  ampicillin-sulbactam (UNASYN) IV 3 g (04/28/22 0614)   promethazine (PHENERGAN) injection (IM or IVPB)     PRN Meds:.acetaminophen, dextrose, docusate sodium, hydrALAZINE, HYDROcodone-acetaminophen, ondansetron (ZOFRAN) IV, polyethylene glycol, promethazine (PHENERGAN) injection (IM or IVPB)  SUBJECTIVE:   24 hour events:  No events noted  No new complaints Having some soft stool but otherwise pain controlled, no fevers.  Asking when he might go home.   Review of Systems  All other systems reviewed and are negative.     OBJECTIVE:   Blood pressure (!) 148/79, pulse 71, temperature 97.9 F (36.6 C), resp. rate 20, height '5\' 10"'$  (1.778 m), weight 69.7 kg, SpO2 100 %. Body mass index is 22.05 kg/m.  Physical  Exam Constitutional:      Appearance: Normal appearance.  HENT:     Head: Normocephalic and atraumatic.  Eyes:     Extraocular Movements: Extraocular movements intact.     Conjunctiva/sclera: Conjunctivae normal.  Pulmonary:     Effort: Pulmonary effort is normal. No respiratory distress.  Musculoskeletal:     Cervical back: Normal range of motion and neck supple.  Skin:    General: Skin is warm and dry.  Neurological:     General: No focal deficit present.     Mental Status: He is alert and oriented to person, place, and  time.  Psychiatric:        Mood and Affect: Mood normal.        Behavior: Behavior normal.      Lab Results: Lab Results  Component Value Date   WBC 2.6 (L) 04/28/2022   HGB 7.7 (L) 04/28/2022   HCT 24.0 (L) 04/28/2022   MCV 91.6 04/28/2022   PLT 144 (L) 04/28/2022    Lab Results  Component Value Date   NA 132 (L) 04/28/2022   K 4.6 04/28/2022   CO2 28 04/28/2022   GLUCOSE 237 (H) 04/28/2022   BUN 22 04/28/2022   CREATININE 1.05 04/28/2022   CALCIUM 7.5 (L) 04/28/2022   GFRNONAA >60 04/28/2022   GFRAA 54 (L) 03/15/2020    Lab Results  Component Value Date   ALT 16 04/17/2022   AST 14 (L) 04/17/2022   ALKPHOS 98 04/17/2022   BILITOT 0.6 04/17/2022       Component Value Date/Time   CRP <0.5 04/08/2022 0438       Component Value Date/Time   ESRSEDRATE >140 (H) 08/09/2021 2252     I have reviewed the micro and lab results in Epic.  Imaging: No results found.   Imaging independently reviewed in Epic.    Raynelle Highland for Infectious Disease [facility] [phone] pager 04/28/2022, 10:24 AM

## 2022-04-28 NOTE — Assessment & Plan Note (Signed)
Phos 2.3 this AM. Replace with PO phos-nak packets x 1 day Repeat phos with AM labs

## 2022-04-28 NOTE — Evaluation (Signed)
Physical Therapy Evaluation Patient Details Name: Nicholas Weiss MRN: [id number] DOB: 10/22/1950 Today's Date: 04/28/2022  History of Present Illness  Patient is a 72 year old male presenting with chief complaints of hyperglycemia. Recent discharge following admission for DKA and COVID. s/p left great toe amputation and left partial 2nd toe amputation for osteomyelitis. Past medical history with multiple myeloma, atrial fibrillation, DM, DKA, CVA, AV first degree block, HTN, anemia.  Clinical Impression  Patient is agreeable to PT. He reports he is independent at baseline and lives with his spouse and son.  No pain reported today. He is Mod I with bed mobility and transfers. With CAM walker in place LLE, patient ambulated a short distance in the room with the rolling walker with supervision- Min guard and occasional cues for safety. Recommend to continue PT while in the hospital to maximize independence. Anticipate patient can return home when medically ready.      Recommendations for follow up therapy are one component of a multi-disciplinary discharge planning process, led by the attending physician.  Recommendations may be updated based on patient status, additional functional criteria and insurance authorization.  Follow Up Recommendations Home health PT      Assistance Recommended at Discharge Set up Supervision/Assistance  Patient can return home with the following  Assist for transportation;Help with stairs or ramp for entrance    Equipment Recommendations None recommended by PT  Recommendations for Other Services       Functional Status Assessment Patient has had a recent decline in their functional status and demonstrates the ability to make significant improvements in function in a reasonable and predictable amount of time.     Precautions / Restrictions Precautions Precautions: Fall Restrictions Weight Bearing Restrictions: Yes LLE Weight Bearing: Weight bearing as  tolerated Other Position/Activity Restrictions: WBAT in a CAM walker with assistance of a walker      Mobility  Bed Mobility Overal bed mobility: Modified Independent Bed Mobility: Supine to Sit, Sit to Supine     Supine to sit: Modified independent (Device/Increase time) Sit to supine: Modified independent (Device/Increase time)        Transfers Overall transfer level: Modified independent Equipment used: Rolling walker (2 wheels)               General transfer comment: CAM boot in place    Ambulation/Gait Ambulation/Gait assistance: Supervision, Min guard Gait Distance (Feet): 25 Feet Assistive device: Rolling walker (2 wheels) Gait Pattern/deviations: Step-through pattern, Decreased stance time - left Gait velocity: decreased     General Gait Details: patient ambulated in the room with left CAM boot in place. occasional cues for safety and encouragement provided to use rolling walker for safety and fall prevention  Stairs            Wheelchair Mobility    Modified Rankin (Stroke Patients Only)       Balance Overall balance assessment: Needs assistance Sitting-balance support: Feet supported, No upper extremity supported Sitting balance-Leahy Scale: Normal     Standing balance support: Bilateral upper extremity supported, During functional activity, Reliant on assistive device for balance Standing balance-Leahy Scale: Fair                               Pertinent Vitals/Pain Pain Assessment Pain Assessment: No/denies pain    Home Living Family/patient expects to be discharged to:: Private residence Living Arrangements: Spouse/significant other (and son) Available Help at Discharge: Family;Available 24 hours/day  Type of Home: House Home Access: Stairs to enter Entrance Stairs-Rails: Can reach both Entrance Stairs-Number of Steps: 1-2 STE bil railing   Home Layout: One level Home Equipment: Cane - single point;Tub bench;Grab  bars - toilet;Grab bars - tub/shower;Rolling Walker (2 wheels)      Prior Function Prior Level of Function : Independent/Modified Independent                     Hand Dominance        Extremity/Trunk Assessment   Upper Extremity Assessment Upper Extremity Assessment: Overall WFL for tasks assessed    Lower Extremity Assessment Lower Extremity Assessment: Overall WFL for tasks assessed       Communication   Communication: No difficulties  Cognition Arousal/Alertness: Awake/alert Behavior During Therapy: WFL for tasks assessed/performed Overall Cognitive Status: Within Functional Limits for tasks assessed                                          General Comments General comments (skin integrity, edema, etc.): donned/doffed CAM boot in sitting with education provided on importance to use with ambulation. Dr Arbutus Ped cleared patient to mobilize with elevated blood sugar in the 490's (via secure chat)    Exercises     Assessment/Plan    PT Assessment Patient needs continued PT services  PT Problem List Decreased activity tolerance;Decreased balance;Decreased mobility       PT Treatment Interventions DME instruction;Gait training;Stair training;Functional mobility training;Therapeutic activities;Therapeutic exercise;Balance training;Neuromuscular re-education;Cognitive remediation    PT Goals (Current goals can be found in the Care Plan section)  Acute Rehab PT Goals Patient Stated Goal: to go home PT Goal Formulation: With patient Time For Goal Achievement: 05/12/22 Potential to Achieve Goals: Good    Frequency Min 2X/week     Co-evaluation               AM-PAC PT "6 Clicks" Mobility  Outcome Measure Help needed turning from your back to your side while in a flat bed without using bedrails?: None Help needed moving from lying on your back to sitting on the side of a flat bed without using bedrails?: None Help needed moving to and  from a bed to a chair (including a wheelchair)?: None Help needed standing up from a chair using your arms (e.g., wheelchair or bedside chair)?: None Help needed to walk in hospital room?: None Help needed climbing 3-5 steps with a railing? : None 6 Click Score: 24    End of Session   Activity Tolerance: Patient tolerated treatment well Patient left: in bed;with call bell/phone within reach   PT Visit Diagnosis: Other abnormalities of gait and mobility (R26.89);Unsteadiness on feet (R26.81)    Time: 8119-1478 PT Time Calculation (min) (ACUTE ONLY): 18 min   Charges:   PT Evaluation $PT Eval Low Complexity: 1 Low PT Treatments $Gait Training: 8-22 mins        Minna Merritts, PT, MPT   Percell Locus 04/28/2022, 3:31 PM

## 2022-04-28 NOTE — Progress Notes (Signed)
Per chart review.  No updates for TOC at this time.  Will continue to follow for needs.

## 2022-04-28 NOTE — Progress Notes (Signed)
Patients CBG was 494 for lunch time blood sugar. Patient stated, "I do not want any insulin, I will not take it." Educated patient on the importance of getting his insulin to lower his blood sugar and he still refused the insulin. Made MD aware.

## 2022-04-29 ENCOUNTER — Encounter: Payer: Self-pay | Admitting: Oncology

## 2022-04-29 ENCOUNTER — Other Ambulatory Visit: Payer: Self-pay

## 2022-04-29 LAB — BASIC METABOLIC PANEL
Anion gap: 4 — ABNORMAL LOW (ref 5–15)
BUN: 25 mg/dL — ABNORMAL HIGH (ref 8–23)
CO2: 28 mmol/L (ref 22–32)
Calcium: 7.9 mg/dL — ABNORMAL LOW (ref 8.9–10.3)
Chloride: 101 mmol/L (ref 98–111)
Creatinine, Ser: 1.04 mg/dL (ref 0.61–1.24)
GFR, Estimated: >60 mL/min (ref >60)
Glucose, Bld: 184 mg/dL — ABNORMAL HIGH (ref 70–99)
Potassium: 4.6 mmol/L (ref 3.5–5.1)
Sodium: 133 mmol/L — ABNORMAL LOW (ref 135–145)

## 2022-04-29 LAB — CULTURE, BLOOD (ROUTINE X 2)
Culture: NO GROWTH
Culture: NO GROWTH
Special Requests: ADEQUATE
Special Requests: ADEQUATE

## 2022-04-29 LAB — PHOSPHORUS: Phosphorus: 2.4 mg/dL — ABNORMAL LOW (ref 2.5–4.6)

## 2022-04-29 LAB — GLUCOSE, CAPILLARY
Glucose-Capillary: 152 mg/dL — ABNORMAL HIGH (ref 70–99)
Glucose-Capillary: 203 mg/dL — ABNORMAL HIGH (ref 70–99)
Glucose-Capillary: 256 mg/dL — ABNORMAL HIGH (ref 70–99)

## 2022-04-29 LAB — SURGICAL PATHOLOGY

## 2022-04-29 MED ORDER — CLOPIDOGREL BISULFATE 75 MG PO TABS
75.0000 mg | ORAL_TABLET | Freq: Every day | ORAL | Status: DC
  Administered 2022-04-29: 75 mg via ORAL
  Filled 2022-04-29: qty 1

## 2022-04-29 MED ORDER — AMOXICILLIN-POT CLAVULANATE 875-125 MG PO TABS: 1.0000 | ORAL_TABLET | Freq: Two times a day (BID) | ORAL | 0 refills | Status: DC

## 2022-04-29 MED ORDER — ACYCLOVIR 200 MG PO CAPS: 400.0000 mg | ORAL_CAPSULE | Freq: Two times a day (BID) | ORAL | 1 refills | Status: DC

## 2022-04-29 MED ORDER — GLUCERNA SHAKE PO LIQD
237.0000 mL | Freq: Three times a day (TID) | ORAL | Status: DC
  Administered 2022-04-29: 237 mL via ORAL

## 2022-04-29 MED ORDER — GLUCERNA SHAKE PO LIQD: 237.0000 mL | Freq: Three times a day (TID) | ORAL | 0 refills | Status: DC

## 2022-04-29 MED ORDER — LOSARTAN POTASSIUM 25 MG PO TABS: 25.0000 mg | ORAL_TABLET | Freq: Every day | ORAL | 1 refills | Status: DC

## 2022-04-29 MED ORDER — GABAPENTIN 300 MG PO CAPS
300.0000 mg | ORAL_CAPSULE | Freq: Every day | ORAL | Status: DC
  Administered 2022-04-29: 300 mg via ORAL
  Filled 2022-04-29: qty 1

## 2022-04-29 MED ORDER — SODIUM PHOSPHATES 45 MMOLE/15ML IV SOLN
30.0000 mmol | Freq: Once | INTRAVENOUS | Status: AC
  Administered 2022-04-29: 30 mmol via INTRAVENOUS
  Filled 2022-04-29: qty 10

## 2022-04-29 MED ORDER — GABAPENTIN 300 MG PO CAPS: 600.0000 mg | ORAL_CAPSULE | Freq: Every day | ORAL | Status: DC

## 2022-04-29 MED ORDER — HYDROCODONE-ACETAMINOPHEN 5-325 MG PO TABS: 1.0000 | ORAL_TABLET | Freq: Four times a day (QID) | ORAL | 0 refills | Status: DC | PRN

## 2022-04-29 NOTE — Progress Notes (Signed)
Nutrition Follow-up  DOCUMENTATION CODES:   Severe malnutrition in context of chronic illness  INTERVENTION:   -Downgrade diet to dysphagia 2 diet for ease of intake -Continue MVI with minerals daily -Continue 500 mg vitamin C BID -Continue 220 mg zinc sulfate daily x 14 days -D/c Ensure Enlive po TID, each supplement provides 350 kcal and 20 grams of protein -Continue Mighty Shake TID, each supplement provides 220 kcals and 6 grams protein -Glucerna Shake po TID, each supplement provides 220 kcal and 10 grams of protein   NUTRITION DIAGNOSIS:   Severe Malnutrition related to chronic illness (DM, cocaine abuse) as evidenced by percent weight loss, moderate fat depletion, severe fat depletion, moderate muscle depletion, severe muscle depletion.  Ongoing  GOAL:   Patient will meet greater than or equal to 90% of their needs  Progressing   MONITOR:   PO intake, Supplement acceptance  REASON FOR ASSESSMENT:   Consult Assessment of nutrition requirement/status, Wound healing  ASSESSMENT:   Pt with significant PMH of multiple myeloma, atrial fibrillation, T1DM, with recuirrent DKA admissions,  AV block first-degree, CKD stage III, CVA, chronic anemia with abnormal SPEP, HTN, HLD and cocaine abuse who presented with chief complaints of hyperglycemia.  1/27- s/p lt great toe amputation and partial 2nd toe amputation  Reviewed I/O's: +840 ml x 24 hours and -426 ml since admission  Pt unavailable at time of visit. Attempted to speak with pt via call to hospital room phone, however, unable to reach.   Pt with improved oral intake. Noted meal completions 80-100%. RD will downgrade diet for ease of intake. He is taking Ensure supplements, but will modify to a lower carbohydrate options secondary to improved oral intake and refusal of insulin. Per RN notes, pt has been refusing insulin at times.   No wt loss noted since admission.   Rehab team recommending home health services at  discharge.   Medications reviewed and include vitamin C and zinc sulfate.   Labs reviewed: Na: 133, CBGS: 115-203 (inpatient orders for glycemic control are 0-5 units insulin aspart daily at bedtime, 0-9 units insulin aspart TID with meals, and 12 units insulin determir daily).    Diet Order:   Diet Order             Diet Carb Modified Fluid consistency: Thin; Room service appropriate? Yes  Diet effective now                   EDUCATION NEEDS:   Education needs have been addressed  Skin:  Skin Assessment: Skin Integrity Issues: Skin Integrity Issues:: Incisions Incisions: s/p amputation of lt great toe and partial amputation of lt second toe Other: -  Last BM:  04/29/22 (type 4)  Height:   Ht Readings from Last 1 Encounters:  04/20/22 '5\' 10"'$  (1.778 m)    Weight:   Wt Readings from Last 1 Encounters:  04/29/22 69.2 kg    Ideal Body Weight:  75.5 kg  BMI:  Body mass index is 21.89 kg/m.  Estimated Nutritional Needs:   Kcal:  2150-2350  Protein:  125-150 grams  Fluid:  > 2 L    Loistine Chance, RD, LDN, [place] Registered Dietitian II Certified Diabetes Care and Education Specialist Please refer to [facility] for RD and/or RD on-call/weekend/after hours pager

## 2022-04-29 NOTE — Care Management Important Message (Signed)
Important Message  Patient Details  Name: Nicholas Weiss MRN: [id number] Date of Birth: 1950-09-03   Medicare Important Message Given:  Yes     [name] 04/29/2022, 11:02 AM

## 2022-04-29 NOTE — Discharge Summary (Signed)
Physician Discharge Summary   Patient: Nicholas Weiss MRN: [id number] DOB: Oct 02, 1950  Admit date:     04/21/2022  Discharge date: 04/29/2022  Discharge Physician: Ezekiel Slocumb   PCP: Dion Body, MD   Recommendations at discharge:    Follow up with Podiatry Follow up with Primary Care Repeat CBC, BMP, Mg in 1-2 weeks  Discharge Diagnoses: Principal Problem:   DKA, type 1 ([place]) Active Problems:   Acute renal failure superimposed on stage 3a chronic kidney disease (HCC)   Hypoglycemia   Osteomyelitis of left foot ([place])   Streptococcal bacteremia   Type 1 diabetes mellitus with diabetic neuropathy, unspecified (HCC)   Essential (primary) hypertension   Anemia of chronic disease   Hyperkalemia   Protein-calorie malnutrition, severe   Multiple myeloma (HCC)   Dyslipidemia   History of CVA (cerebrovascular accident)   History of substance abuse ([place])   Weight loss   Diabetic ulcer of toe associated with diabetes mellitus due to underlying condition, with fat layer exposed ([place])   Leukopenia   History of multiple myeloma   Sympathotonic orthostatic hypotension   Hypophosphatemia  Resolved Problems:   Sepsis [facility])  Hospital Course: HPI on admission, from ICU H&P: "72 y.o with significant PMH of multiple myeloma, atrial fibrillation, T1DM, with recuirrent DKA admissions,  AV block first-degree, CKD stage III, CVA, chronic anemia with abnormal SPEP, HTN, HLD and cocaine abuse who presented to the ED with chief complaints of hyperglycemia.    Patient report he has been feeling unwell x 1 week and was unable to tolerate po intake or take his medications. He initially reported hypoglycemia however today he complained of nausea and vomiting . He was recently discharged on 0/11/2022 following admission for DKA and COVID treated with Paxlovid.   ED Course: Initial vital signs showed HR of 110 beats/minute, BP 105/50 mm Hg, the RR 16 breaths/minute, and the oxygen saturation  100 % on RA and a temperature of 97.64F (36.5C). Pertinent Labs/Diagnostics Findings: Chemistry:Na+/ K+: 137/7.5 Glucose 897: BUN/Cr.  89/3.88, CO2 8, Anion gap 30 CBC: WBC: 5.9 Hgb/Hct: 10.5/38.0  Other Lab findings: Beta hydroxybutyrate>8.0, Lactic acid: 4.8 COVID PCR: positive on 04/07/22 Venous Blood Gas result:  pO2 51; pCO2 34; pH 7.02;  HCO3 8.8, %O2 Sat 58.1.    The laboratory data showed consistent with patient's well-known recurrent DKA. Urinalysis and chest xray demonstrated no evidence of infection.  Given ECG changes and severe AKI, the management was initiated with initial intravenous fluid, hyperkalemia correction with IV calcium gluconate, and intravenous (IV) bicarbonate push followed by intravenous insulin bolus and infusion as per DKA protocol. Serum electrolytes were closely monitored. PCCM consulted to admit for further management of DKA."  1/24: [facility] assumed care.  Pt off insulin drip.  Seen by podiatry for left foot osteomyelitis.  They plan for amputation this admission. Blood cultures also positive for GPC's, suspect foot is the source.  Changed Augmentin to Unasyn. 1/25: left great toe amputation planned but cancelled by anesthesia due to cocaine+.  ID consulted.  Continuing Unasyn. 1/26: hypoglemic episodes requiring dextrose fluids. Surgery cancelled again, stlil cocain positive UDS this AM.  New symptomatic orthostatic hypotension afternoon, 500 cc bolus and resume maintenance fluids with D5-NS .Marland Kitchen...  1/31 -- sugars stabilized for the most part since diet resumed.  Pt intermittently refuses insulin and becomes hyperglycemic again, but no recurrence of DKA.  He is improved and stable for discharge, cleared by consultant/s.  To continue Augmentin through 05/23/22.  Assessment  and Plan: * DKA, type 1 ([place]) POA, resolved.  Sugars are very labile. Continue Levemir and Novolog Levemir was reduced to 1/2 dose 8 units while awaiting surgery (NPO) Increase Levemir to 12 units  tonight and further as sugars tolerate. Adjust insulin for goal 140-180 Hypoglycemia protocol.  Streptococcal bacteremia See osteomyelitis. Appreciate ID's assistance.  Osteomyelitis of left foot [facility]) Podiatry consulted, Surgery was delayed a few days due to  positive for cocaine, anesthesia here will not sedate.  Finally underwent amputation 1/27. --Continue Unasyn given concurrent Streptococcal Bacteremia. Follow repeat blood cultures. Appreciate Infectious Disease input. Echo negative for vegatation. --Follow surgical pathology. --ABI's in Dec 2024 were normal. --ID plans 4 weeks antibiotics after day of surgery (1/27 through 2/24). --Continue Unasyn. --Discharge on Augmentin. --Wound care --Optimize glycemic control --PT evaluation  Sepsis Ruled Out.  Pt did not meet 2+ SIRS criteria, only tachycardia.  Lactic acidosis due to dehydration with DKA.  Hypoglycemia With NPO status for potential surgery, patient has been having hypoglycemic episodes.  He required D5-NS at 125/hr followed by D10 on AM of 1/26 --Required dextrose fluids due to recurrent hypoglycemic episodes with NPO perioperatively --Continue close monitoring --Hypoglycemia protocol  Acute renal failure superimposed on stage 3a chronic kidney disease ([place]) Cr on admission 2.96. Cr today 2.44 >> 1.45 >> 1.37...1.05 Improved with IV hydration. Likely pre-renal azotemia.  Protein-calorie malnutrition, severe Related to chronic illness (DM, cocaine abuse) as evidenced by percent weight loss, moderate fat depletion, severe fat depletion, moderate muscle depletion, severe muscle depletion.  --Appreciate dietitian recommendations: -Downgrade diet to dysphagia 2 diet for ease of intake -MVI with minerals daily -500 mg vitamin C BID -220 mg zinc sulfate daily x 14 days -Ensure Enlive po TID, each supplement provides 350 kcal and 20 grams of protein -Mighty Shake TID, each supplement provides 220 kcals and 6 grams  protein   Hyperkalemia Resolved.  Anemia of chronic disease Hbg down a bit since surgery, but stable. Monitor CBC. Transfuse if Hbg < 7 Last Hbg 7.7  Essential (primary) hypertension ACEI/ARC held due to AKI. Continue amlodipine. PRN IV hydralazine  Type 1 diabetes mellitus with diabetic neuropathy, unspecified ([place]) See DKA  History of substance abuse ([place]) UDS positive for cocaine and cannabis. On admission.  Surgery had to be delayed until negative for cocaine.  History of CVA (cerebrovascular accident) Continue statin Held ASA, Plavix for surgery Resume ASA today Likely resume Plavix tomorrow if no further bleeding and Hbg stable/improved.  Dyslipidemia Continue statin  Multiple myeloma (HCC) -Currently on Revlimid -Follows with oncology -On acyclovir for prophylaxis, needs refills sent at d/c  Hypophosphatemia Phos 2.3 this AM. Replace with PO phos-nak packets x 1 day Repeat phos with AM labs  Sympathotonic orthostatic hypotension Pt BP dropped to 68/48 afternoon 1/26 when standing while bed linens were changed.  Resolved after 500 cc bolus and maintenance fluids. --Stop IV fluids --Monitor closely --Fall precautions  History of multiple myeloma .  Leukopenia Likely due to infection +/- dilution.   WBC 2.2k >> 2.5k >> 2.4 >> 2.6 Monitor CBC, check diff with AM labs  Diabetic ulcer of toe associated with diabetes mellitus due to underlying condition, with fat layer exposed ([place]) See osteomyelitis  Weight loss Pt reports 24 lb weight loss in past month, very poor appetite.  Appreciate dietitian consultation and recommendations.         Consultants: Podiatry, Infectious Disease Procedures performed: Left great toe amputation, Left second toe partial amputation   Disposition: Home Diet  recommendation:  Cardiac and Carb modified diet DISCHARGE MEDICATION: Allergies as of 04/29/2022       Reactions   Penicillins Other (See Comments)    Childhood allergy -tolerated amoxil 03/2022 Unknown reaction Has patient had a PCN reaction causing immediate rash, facial/tongue/throat swelling, SOB or lightheadedness with hypotension: No Has patient had a PCN reaction causing severe rash involving mucus membranes or skin necrosis: No Has patient had a PCN reaction that required hospitalization No Has patient had a PCN reaction occurring within the last 10 years: No If all of the above answers are "NO", then may proceed with Cephalosporin use.        Medication List     STOP taking these medications    Ipratropium-Albuterol 20-100 MCG/ACT Aers respimat Commonly known as: COMBIVENT   lenalidomide 10 MG capsule Commonly known as: REVLIMID   prochlorperazine 10 MG tablet Commonly known as: COMPAZINE       TAKE these medications    acetaminophen 500 MG tablet Commonly known as: TYLENOL Take 1,000 mg by mouth daily as needed for mild pain or moderate pain.   acyclovir 200 MG capsule Commonly known as: ZOVIRAX Take 2 capsules (400 mg total) by mouth 2 (two) times daily.   amoxicillin-clavulanate 875-125 MG tablet Commonly known as: AUGMENTIN Take 1 tablet by mouth 2 (two) times daily for 24 days.   ascorbic acid 500 MG tablet Commonly known as: VITAMIN C Take 1 tablet (500 mg total) by mouth daily.   aspirin EC 81 MG tablet Take 81 mg by mouth in the morning.   atorvastatin 80 MG tablet Commonly known as: LIPITOR Take 1 tablet (80 mg total) by mouth at bedtime. Hold while taking Paxlovid   Baclofen 5 MG Tabs Take 5 mg by mouth daily as needed (muscle spasm).   clopidogrel 75 MG tablet Commonly known as: PLAVIX Take 75 mg by mouth in the morning.   docusate sodium 100 MG capsule Commonly known as: COLACE Take 100 mg by mouth daily as needed for mild constipation.   Farxiga 10 MG Tabs tablet Generic drug: dapagliflozin propanediol Take 10 mg by mouth in the morning.   feeding supplement (GLUCERNA SHAKE)  Liqd Take 237 mLs by mouth 3 (three) times daily between meals.   ferrous sulfate 325 (65 FE) MG tablet Take 1 tablet (325 mg total) by mouth daily with breakfast.   gabapentin 300 MG capsule Commonly known as: NEURONTIN Take 300-600 mg by mouth See admin instructions. 300 mg in the morning, 600 mg at night.   guaiFENesin-dextromethorphan 100-10 MG/5ML syrup Commonly known as: ROBITUSSIN DM Take 10 mLs by mouth every 4 (four) hours as needed for cough.   HYDROcodone-acetaminophen 5-325 MG tablet Commonly known as: NORCO/VICODIN Take 1 tablet by mouth every 6 (six) hours as needed for severe pain.   losartan 25 MG tablet Commonly known as: COZAAR Take 1 tablet (25 mg total) by mouth daily.   multivitamin with minerals Tabs tablet Take 1 tablet by mouth daily.   NovoLOG 100 UNIT/ML injection Generic drug: insulin aspart Inject 0-9 Units into the skin 3 (three) times daily with meals.   ondansetron 8 MG tablet Commonly known as: ZOFRAN Take 8 mg by mouth daily as needed for nausea or vomiting.   [name] FlexTouch 100 UNIT/ML FlexTouch Pen Generic drug: insulin degludec Inject 15 Units into the skin daily. What changed: how much to take   zinc sulfate 220 (50 Zn) MG capsule Take 1 capsule (220 mg total) by mouth  daily.               Discharge Care Instructions  (From admission, onward)           Start     Ordered   04/29/22 0000  Leave dressing on - Keep it clean, dry, and intact until clinic visit        04/29/22 1111            Follow-up Information     Tsosie Billing, MD. Go to.   Specialty: Infectious Diseases Why: Follow up on 05/19/2022 at 9 AM, or call to reschedule Contact information: [place] [address] [phone]         Edrick Kins, DPM. Go on 05/05/2022.   Specialty: Podiatry Why: '@2'$ :15pm. Please bring your photo ID and insurance info Contact information: [place]  [address] [phone]         Dion Body, MD. Go on 05/04/2022.   Specialty: Family Medicine Why: '@12'$ :30pm Contact information: [place] [address] [phone]         Sindy Guadeloupe, MD. Schedule an appointment as soon as possible for a visit.   Specialty: Oncology Contact information: [place] [address] [phone]                Discharge Exam: Danley Danker Weights   04/27/22 0309 04/28/22 0500 04/29/22 0213  Weight: 69.1 kg 69.7 kg 69.2 kg   General exam: awake, alert, no acute distress HEENT: atraumatic, clear conjunctiva, anicteric sclera, moist mucus membranes, hearing grossly normal  Respiratory system: CTAB, no wheezes, rales or rhonchi, normal respiratory effort. Cardiovascular system: normal S1/S2, RRR, no JVD, murmurs, rubs, gallops,  no pedal edema.   Gastrointestinal system: soft, NT, ND, no HSM felt, +bowel sounds. Central nervous system: A&O x4. no gross focal neurologic deficits, normal speech Extremities: left foot with clean dry intact dressing, no edema, normal tone Skin: dry, intact, normal temperature, normal color, no rashes, lesions or ulcers Psychiatry: normal mood, congruent affect, judgement and insight appear normal   Condition at discharge: stable  The results of significant diagnostics from this hospitalization (including imaging, microbiology, ancillary and laboratory) are listed below for reference.   Imaging Studies: DG Foot Complete Left  Result Date: 05/05/2022 Please see detailed radiograph report in office note.  DG Foot Complete Left  Result Date: 04/25/2022 CLINICAL DATA:  Status post left great toe amputation and partial second toe amputation. EXAM: LEFT FOOT - COMPLETE 3+ VIEW COMPARISON:  Preoperative imaging. FINDINGS: Interval resection of the great toe. Interval resection of the second toe at the distal aspect of the proximal phalanx. Expected  postsurgical change in the overlying soft tissues. No acute fracture. The exam is otherwise unchanged. IMPRESSION: Interval resection of the great toe. Interval resection of the second toe at the distal aspect of the proximal phalanx. Electronically Signed   By: Keith Rake M.D.   On: 04/25/2022 18:36   ECHOCARDIOGRAM COMPLETE  Result Date: 04/24/2022    ECHOCARDIOGRAM REPORT   Patient Name:   HAITHAM DOLINSKY Date of Exam: 04/24/2022 Medical Rec #:  [id number]       Height:       70.0 in Accession #:    [id number]      Weight:       140.2 lb Date of Birth:  October 07, 1950      BSA:  1.795 m Patient Age:    43 years        BP:           141/68 mmHg Patient Gender: M               HR:           77 bpm. Exam Location:  [facility] Procedure: 2D Echo, Cardiac Doppler and Color Doppler Indications:     Bacteremia  History:         Patient has prior history of Echocardiogram examinations, most                  recent 11/27/2021. Stroke, Arrythmias:Atrial Fibrillation,                  Signs/Symptoms:Syncope and Bacteremia; Risk                  Factors:Hypertension, Diabetes and Dyslipidemia. Multiple                  Myeloma, Substance abuse.  Sonographer:     Wenda Low Referring Phys:  [id number] Claiborne Billings A Irja Wheless Diagnosing Phys: Ida Rogue MD IMPRESSIONS  1. Left ventricular ejection fraction, by estimation, is 55 to 60%. The left ventricle has normal function. The left ventricle has no regional wall motion abnormalities. There is mild asymmetric left ventricular hypertrophy of the basal-septal segment. Left ventricular diastolic parameters are consistent with Grade I diastolic dysfunction (impaired relaxation).  2. Right ventricular systolic function is normal. The right ventricular size is normal. There is normal pulmonary artery systolic pressure.  3. The mitral valve is normal in structure. No evidence of mitral valve regurgitation. No evidence of mitral stenosis.  4. The aortic valve has an  indeterminant number of cusps. Aortic valve regurgitation is not visualized. Aortic valve sclerosis/calcification is present, without any evidence of aortic stenosis.  5. The inferior vena cava is normal in size with greater than 50% respiratory variability, suggesting right atrial pressure of 3 mmHg. FINDINGS  Left Ventricle: Left ventricular ejection fraction, by estimation, is 55 to 60%. The left ventricle has normal function. The left ventricle has no regional wall motion abnormalities. The left ventricular internal cavity size was normal in size. There is  mild asymmetric left ventricular hypertrophy of the basal-septal segment. Left ventricular diastolic parameters are consistent with Grade I diastolic dysfunction (impaired relaxation). Right Ventricle: The right ventricular size is normal. No increase in right ventricular wall thickness. Right ventricular systolic function is normal. There is normal pulmonary artery systolic pressure. The tricuspid regurgitant velocity is 1.79 m/s, and  with an assumed right atrial pressure of 3 mmHg, the estimated right ventricular systolic pressure is 34.7 mmHg. Left Atrium: Left atrial size was normal in size. Right Atrium: Right atrial size was normal in size. Pericardium: There is no evidence of pericardial effusion. Mitral Valve: The mitral valve is normal in structure. There is mild calcification of the mitral valve leaflet(s). No evidence of mitral valve regurgitation. No evidence of mitral valve stenosis. MV peak gradient, 5.5 mmHg. The mean mitral valve gradient  is 2.0 mmHg. Tricuspid Valve: The tricuspid valve is normal in structure. Tricuspid valve regurgitation is mild . No evidence of tricuspid stenosis. Aortic Valve: The aortic valve has an indeterminant number of cusps. Aortic valve regurgitation is not visualized. Aortic valve sclerosis/calcification is present, without any evidence of aortic stenosis. Aortic valve mean gradient measures 2.0 mmHg. Aortic  valve peak gradient measures 3.6 mmHg. Aortic valve area,  by VTI measures 2.66 cm. Pulmonic Valve: The pulmonic valve was normal in structure. Pulmonic valve regurgitation is not visualized. No evidence of pulmonic stenosis. Aorta: The aortic root is normal in size and structure. Venous: The inferior vena cava is normal in size with greater than 50% respiratory variability, suggesting right atrial pressure of 3 mmHg. IAS/Shunts: No atrial level shunt detected by color flow Doppler.  LEFT VENTRICLE PLAX 2D LVIDd:         4.40 cm   Diastology LVIDs:         2.80 cm   LV e' medial:    5.55 cm/s LV PW:         1.20 cm   LV E/e' medial:  9.0 LV IVS:        1.00 cm   LV e' lateral:   11.60 cm/s LVOT diam:     2.00 cm   LV E/e' lateral: 4.3 LV SV:         48 LV SV Index:   27 LVOT Area:     3.14 cm  RIGHT VENTRICLE RV Basal diam:  3.25 cm RV Mid diam:    3.00 cm RV S prime:     12.60 cm/s TAPSE (M-mode): 2.6 cm LEFT ATRIUM             Index        RIGHT ATRIUM           Index LA diam:        3.50 cm 1.95 cm/m   RA Area:     16.30 cm LA Vol (A2C):   67.5 ml 37.61 ml/m  RA Volume:   45.00 ml  25.07 ml/m LA Vol (A4C):   44.6 ml 24.85 ml/m LA Biplane Vol: 56.0 ml 31.20 ml/m  AORTIC VALVE                    PULMONIC VALVE AV Area (Vmax):    2.55 cm     PV Vmax:       0.90 m/s AV Area (Vmean):   2.34 cm     PV Peak grad:  3.3 mmHg AV Area (VTI):     2.66 cm AV Vmax:           94.50 cm/s AV Vmean:          64.500 cm/s AV VTI:            0.181 m AV Peak Grad:      3.6 mmHg AV Mean Grad:      2.0 mmHg LVOT Vmax:         76.60 cm/s LVOT Vmean:        48.100 cm/s LVOT VTI:          0.153 m LVOT/AV VTI ratio: 0.85  AORTA Ao Root diam: 3.60 cm MITRAL VALVE               TRICUSPID VALVE MV Area (PHT): 8.82 cm    TR Peak grad:   12.8 mmHg MV Area VTI:   1.97 cm    TR Vmax:        179.00 cm/s MV Peak grad:  5.5 mmHg MV Mean grad:  2.0 mmHg    SHUNTS MV Vmax:       1.17 m/s    Systemic VTI:  0.15 m MV Vmean:      57.3 cm/s    Systemic Diam: 2.00 cm MV Decel Time: 86 msec MV E  velocity: 49.80 cm/s MV A velocity: 78.60 cm/s MV E/A ratio:  0.63 Ida Rogue MD Electronically signed by Ida Rogue MD Signature Date/Time: 04/24/2022/12:18:55 PM    Final    MR FOOT LEFT WO CONTRAST  Result Date: 04/21/2022 CLINICAL DATA:  Foot swelling, diabetic, osteomyelitis suspected, xray done EXAM: MRI OF THE LEFT FOOT WITHOUT CONTRAST TECHNIQUE: Multiplanar, multisequence MR imaging of the left forefoot was performed. No intravenous contrast was administered. COMPARISON:  Left foot radiograph 04/21/2022 FINDINGS: Bones/Joint/Cartilage There is marrow edema and confluent low T1 signal within the great toe distal phalanx with bony fragmentation. There is focal marrow edema at the lateral base of the great toe proximal phalanx core with small fracture fragment as seen on recent radiograph. There is moderate first MTP osteoarthritis with mild periarticular subchondral marrow edema. There is also marrow edema within the second toe distal phalanx and mildly low T1 signal in the distal phalangeal tuft (coronal T2 and T1 image 5). Ligaments Intact Lisfranc ligament. Muscles and Tendons Diffuse muscle edema and developing atrophy in the foot, compatible with denervation change. Soft tissues Diffuse soft tissue swelling of the foot. Soft tissue ulcer at the tip of the great toe with underlying lobular confluent fluid measuring 1.2 x 1.0 x 1.3 cm (axial T2 image 6, coronal T2 image 9). Probable soft tissue ulcer at the tip of the second toe. IMPRESSION: Osteomyelitis of the great toe distal phalanx with adjacent ulcer and lobular confluent fluid at the tip of the great toe suspicious for small abscess measuring 1.2 x 1.0 x 1.3 cm. Early osteomyelitis of the second toe distal phalanx with adjacent ulcer and soft tissue swelling. Moderate first MTP osteoarthritis with periarticular marrow edema and possible small fracture at the lateral proximal phalangeal  base. Electronically Signed   By: Maurine Simmering M.D.   On: 04/21/2022 15:53   DG Foot Complete Left  Result Date: 04/21/2022 CLINICAL DATA:  Wound dehiscence EXAM: LEFT FOOT - COMPLETE 3+ VIEW COMPARISON:  None Available. FINDINGS: There is soft tissue irregularity overlying the great toe distal phalanx. There is significant osseous erosion/destruction involving the underlying distal phalanx consistent with osteomyelitis. There is cortical irregularity at the base of the great toe proximal phalanx along the medial aspect There is no other acute osseous abnormality. Bony alignment is normal. IMPRESSION: 1. Soft tissue irregularity overlying the great toe distal phalanx with destruction of the underlying bone consistent with osteomyelitis. 2. Cortical irregularity of the medial aspect of the base of the great toe proximal phalanx could reflect fracture or additional site of infection. Electronically Signed   By: Valetta Mole M.D.   On: 04/21/2022 13:21   DG Chest 1 View  Result Date: 04/21/2022 CLINICAL DATA:  72 year old male with history of pneumonia and sepsis. EXAM: CHEST  1 VIEW COMPARISON:  Chest x-ray 04/06/2022. FINDINGS: Lung volumes are normal. No consolidative airspace disease. No pleural effusions. Skin fold artifact noted in the periphery of the right lung. No pneumothorax. No pulmonary nodule or mass noted. Pulmonary vasculature and the cardiomediastinal silhouette are within normal limits. Atherosclerosis in the thoracic aorta. IMPRESSION: 1.  No radiographic evidence of acute cardiopulmonary disease. 2. Aortic atherosclerosis. Electronically Signed   By: Vinnie Langton M.D.   On: 04/21/2022 06:26   US Venous Img Lower Unilateral Left  Result Date: 04/09/2022 CLINICAL DATA:  Left lower extremity edema. EXAM: LEFT LOWER EXTREMITY VENOUS DOPPLER ULTRASOUND TECHNIQUE: Gray-scale sonography with graded compression, as well as color Doppler and duplex ultrasound were performed  to evaluate the  lower extremity deep venous systems from the level of the common femoral vein and including the common femoral, femoral, profunda femoral, popliteal and calf veins including the posterior tibial, peroneal and gastrocnemius veins when visible. The superficial great saphenous vein was also interrogated. Spectral Doppler was utilized to evaluate flow at rest and with distal augmentation maneuvers in the common femoral, femoral and popliteal veins. COMPARISON:  None Available. FINDINGS: Contralateral Common Femoral Vein: Respiratory phasicity is normal and symmetric with the symptomatic side. No evidence of thrombus. Normal compressibility. Common Femoral Vein: No evidence of thrombus. Normal compressibility, respiratory phasicity and response to augmentation. Saphenofemoral Junction: No evidence of thrombus. Normal compressibility and flow on color Doppler imaging. Profunda Femoral Vein: No evidence of thrombus. Normal compressibility and flow on color Doppler imaging. Femoral Vein: No evidence of thrombus. Normal compressibility, respiratory phasicity and response to augmentation. Popliteal Vein: No evidence of thrombus. Normal compressibility, respiratory phasicity and response to augmentation. Calf Veins: No evidence of thrombus. Normal compressibility and flow on color Doppler imaging. Superficial Great Saphenous Vein: No evidence of thrombus. Normal compressibility. Venous Reflux:  None. Other Findings: No evidence of superficial thrombophlebitis or abnormal fluid collection. IMPRESSION: No evidence of left lower extremity deep venous thrombosis. Electronically Signed   By: Aletta Edouard M.D.   On: 04/09/2022 14:41   DG Chest Port 1 View  Result Date: 04/06/2022 CLINICAL DATA:  86247 Dizziness [id number] EXAM: PORTABLE CHEST - 1 VIEW COMPARISON:  02/24/2022. FINDINGS: Cardiac silhouette is unremarkable. No pneumothorax or pleural effusion. The lungs are clear. Aorta is calcified. The visualized skeletal structures  are unremarkable. IMPRESSION: No acute cardiopulmonary process. Electronically Signed   By: Sammie Bench M.D.   On: 04/06/2022 21:16    Microbiology: Results for orders placed or performed during the hospital encounter of 04/21/22  Culture, blood (Routine X 2) w Reflex to ID Panel     Status: Abnormal   Collection Time: 04/21/22  3:50 AM   Specimen: BLOOD  Result Value Ref Range Status   Specimen Description   Final    BLOOD HAND Performed at [facility], [address], [place], [place] [address]    Special Requests   Final    BOTTLES DRAWN AEROBIC AND ANAEROBIC Blood Culture adequate volume Performed at [facility], [address] [place], [place] [address]    Culture  Setup Time   Final    GRAM POSITIVE COCCI ANAEROBIC BOTTLE ONLY CRITICAL RESULT CALLED TO, READ BACK BY AND VERIFIED WITH: NATHAN BELEUE '@0206'$  ON 04/22/22 SKL Performed at [facility], [place] [address] [place],  [address]    Culture STREPTOCOCCUS PARASANGUINIS (A)  Final   Report Status 04/24/2022 FINAL  Final   Organism ID, Bacteria STREPTOCOCCUS PARASANGUINIS  Final      Susceptibility   Streptococcus parasanguinis - MIC*    PENICILLIN 0.12 SENSITIVE Sensitive     CEFTRIAXONE <=0.12 SENSITIVE Sensitive     LEVOFLOXACIN 2 SENSITIVE Sensitive     VANCOMYCIN 0.5 SENSITIVE Sensitive     * STREPTOCOCCUS PARASANGUINIS  Blood Culture ID Panel (Reflexed)     Status: Abnormal   Collection Time: 04/21/22  3:50 AM  Result Value Ref Range Status   Enterococcus faecalis NOT DETECTED NOT DETECTED Final   Enterococcus Faecium NOT DETECTED NOT DETECTED Final   Listeria monocytogenes NOT DETECTED NOT DETECTED Final   Staphylococcus species NOT DETECTED NOT DETECTED Final   Staphylococcus aureus (BCID) NOT DETECTED NOT DETECTED  Final   Staphylococcus epidermidis NOT DETECTED NOT DETECTED Final   Staphylococcus lugdunensis NOT DETECTED NOT DETECTED Final   Streptococcus species DETECTED  (A) NOT DETECTED Final    Comment: Not Enterococcus species, Streptococcus agalactiae, Streptococcus pyogenes, or Streptococcus pneumoniae. CRITICAL RESULT CALLED TO, READ BACK BY AND VERIFIED WITH: NATHAN BELEUE '@0206'$  ON 04/22/22 SKL    Streptococcus agalactiae NOT DETECTED NOT DETECTED Final   Streptococcus pneumoniae NOT DETECTED NOT DETECTED Final   Streptococcus pyogenes NOT DETECTED NOT DETECTED Final   A.calcoaceticus-baumannii NOT DETECTED NOT DETECTED Final   Bacteroides fragilis NOT DETECTED NOT DETECTED Final   Enterobacterales NOT DETECTED NOT DETECTED Final   Enterobacter cloacae complex NOT DETECTED NOT DETECTED Final   Escherichia coli NOT DETECTED NOT DETECTED Final   Klebsiella aerogenes NOT DETECTED NOT DETECTED Final   Klebsiella oxytoca NOT DETECTED NOT DETECTED Final   Klebsiella pneumoniae NOT DETECTED NOT DETECTED Final   Proteus species NOT DETECTED NOT DETECTED Final   Salmonella species NOT DETECTED NOT DETECTED Final   Serratia marcescens NOT DETECTED NOT DETECTED Final   Haemophilus influenzae NOT DETECTED NOT DETECTED Final   Neisseria meningitidis NOT DETECTED NOT DETECTED Final   Pseudomonas aeruginosa NOT DETECTED NOT DETECTED Final   Stenotrophomonas maltophilia NOT DETECTED NOT DETECTED Final   Candida albicans NOT DETECTED NOT DETECTED Final   Candida auris NOT DETECTED NOT DETECTED Final   Candida glabrata NOT DETECTED NOT DETECTED Final   Candida krusei NOT DETECTED NOT DETECTED Final   Candida parapsilosis NOT DETECTED NOT DETECTED Final   Candida tropicalis NOT DETECTED NOT DETECTED Final   Cryptococcus neoformans/gattii NOT DETECTED NOT DETECTED Final    Comment: Performed at [facility], [address] [place], [place] [address]  Culture, blood (Routine X 2) w Reflex to ID Panel     Status: Abnormal   Collection Time: 04/21/22  3:58 AM   Specimen: BLOOD  Result Value Ref Range Status   Specimen Description   Final    BLOOD BLOOD  RIGHT ARM Performed at [facility], [address] [place], [place] [address]    Special Requests   Final    BOTTLES DRAWN AEROBIC AND ANAEROBIC Blood Culture results may not be optimal due to an excessive volume of blood received in culture bottles Performed at [facility], [address] [place], [place] [address]    Culture  Setup Time   Final    ANAEROBIC BOTTLE ONLY AEROBIC BOTTLE ONLY GRAM POSITIVE COCCI CRITICAL VALUE NOTED.  VALUE IS CONSISTENT WITH PREVIOUSLY REPORTED AND CALLED VALUE.    Culture (A)  Final    STREPTOCOCCUS PARASANGUINIS SUSCEPTIBILITIES PERFORMED ON PREVIOUS CULTURE WITHIN THE LAST 5 DAYS. STAPHYLOCOCCUS EPIDERMIDIS THE SIGNIFICANCE OF ISOLATING THIS ORGANISM FROM A SINGLE SET OF BLOOD CULTURES WHEN MULTIPLE SETS ARE DRAWN IS UNCERTAIN. PLEASE NOTIFY THE MICROBIOLOGY DEPARTMENT WITHIN ONE WEEK IF SPECIATION AND SENSITIVITIES ARE REQUIRED. CRITICAL RESULT CALLED TO, READ BACK BY AND VERIFIED WITH: Paulla Dolly  [id number] FCP  Performed at [facility], [place] [address] [place], [place] [address]    Report Status 04/25/2022 FINAL  Final  Aerobic/Anaerobic Culture w Gram Stain (surgical/deep wound)     Status: None   Collection Time: 04/21/22  4:15 AM   Specimen: Toe; Wound  Result Value Ref Range Status   Specimen Description   Final    TOE LEFT Performed at [facility], [address], [place], [place] [address]  Special Requests   Final    NONE Performed at [facility], [address] [place], [place] [address]    Gram Stain   Final    RARE WBC PRESENT, PREDOMINANTLY PMN MODERATE GRAM POSITIVE COCCI RARE GRAM POSITIVE RODS    Culture   Final    MODERATE GROUP B STREP(S.AGALACTIAE)ISOLATED TESTING AGAINST S. AGALACTIAE NOT ROUTINELY PERFORMED DUE TO PREDICTABILITY OF AMP/PEN/VAN SUSCEPTIBILITY. MODERATE ENTEROCOCCUS FAECALIS RARE BACTEROIDES FRAGILIS BETA LACTAMASE POSITIVE Performed  at [facility], [place] [address], [place], [place] [address]    Report Status 04/25/2022 FINAL  Final   Organism ID, Bacteria ENTEROCOCCUS FAECALIS  Final      Susceptibility   Enterococcus faecalis - MIC*    AMPICILLIN <=2 SENSITIVE Sensitive     VANCOMYCIN 1 SENSITIVE Sensitive     GENTAMICIN SYNERGY SENSITIVE Sensitive     * MODERATE ENTEROCOCCUS FAECALIS  Culture, blood (Routine X 2) w Reflex to ID Panel     Status: None   Collection Time: 04/24/22  2:31 PM   Specimen: Right Antecubital; Blood  Result Value Ref Range Status   Specimen Description RIGHT ANTECUBITAL  Final   Special Requests   Final    BOTTLES DRAWN AEROBIC AND ANAEROBIC Blood Culture adequate volume   Culture   Final    NO GROWTH 5 DAYS Performed at [facility], [address], [place], [place] [address]    Report Status 04/29/2022 FINAL  Final  Culture, blood (Routine X 2) w Reflex to ID Panel     Status: None   Collection Time: 04/24/22  2:31 PM   Specimen: BLOOD RIGHT HAND  Result Value Ref Range Status   Specimen Description BLOOD RIGHT HAND  Final   Special Requests   Final    BOTTLES DRAWN AEROBIC AND ANAEROBIC Blood Culture adequate volume   Culture   Final    NO GROWTH 5 DAYS Performed at [facility], [address] [place], [place] [address]    Report Status 04/29/2022 FINAL  Final    Labs: CBC: No results for input(s): "WBC", "NEUTROABS", "HGB", "HCT", "MCV", "PLT" in the last 168 hours.  Basic Metabolic Panel: No results for input(s): "NA", "K", "CL", "CO2", "GLUCOSE", "BUN", "CREATININE", "CALCIUM", "MG", "PHOS" in the last 168 hours.  Liver Function Tests: No results for input(s): "AST", "ALT", "ALKPHOS", "BILITOT", "PROT", "ALBUMIN" in the last 168 hours.  CBG: No results for input(s): "GLUCAP" in the last 168 hours.   Discharge time spent: greater than 30 minutes.  Signed: Ezekiel Slocumb, DO [facility] 05/06/2022

## 2022-04-30 ENCOUNTER — Other Ambulatory Visit: Payer: Self-pay | Admitting: Oncology

## 2022-04-30 DIAGNOSIS — C9000 Multiple myeloma not having achieved remission: Secondary | ICD-10-CM

## 2022-05-01 ENCOUNTER — Inpatient Hospital Stay: Payer: 59

## 2022-05-01 ENCOUNTER — Inpatient Hospital Stay: Payer: 59 | Admitting: Oncology

## 2022-05-01 ENCOUNTER — Ambulatory Visit: Payer: 59

## 2022-05-03 ENCOUNTER — Other Ambulatory Visit: Payer: Self-pay

## 2022-05-04 DIAGNOSIS — Z89412 Acquired absence of left great toe: Secondary | ICD-10-CM | POA: Insufficient documentation

## 2022-05-05 ENCOUNTER — Encounter: Payer: Self-pay | Admitting: Podiatry

## 2022-05-05 ENCOUNTER — Ambulatory Visit (INDEPENDENT_AMBULATORY_CARE_PROVIDER_SITE_OTHER): Payer: 59 | Admitting: Podiatry

## 2022-05-05 ENCOUNTER — Inpatient Hospital Stay: Payer: 59 | Attending: Oncology

## 2022-05-05 ENCOUNTER — Inpatient Hospital Stay: Payer: 59 | Admitting: Oncology

## 2022-05-05 ENCOUNTER — Ambulatory Visit (INDEPENDENT_AMBULATORY_CARE_PROVIDER_SITE_OTHER): Payer: 59

## 2022-05-05 VITALS — BP 139/76 | HR 77

## 2022-05-05 DIAGNOSIS — Z9889 Other specified postprocedural states: Secondary | ICD-10-CM

## 2022-05-05 NOTE — Progress Notes (Signed)
   Chief Complaint  Patient presents with   Routine Post Op    "It don't hurt or anything."    Subjective:  Patient presents today status post right great toe amputation and partial second toe amputation performed inpatient 04/25/2022.  Patient doing well.  WBAT in the cam boot.  Currently on Augmentin at discharge.  Past Medical History:  Diagnosis Date   DKA (diabetic ketoacidoses) 04/06/2016   Hypercholesteremia    Hypertension     Past Surgical History:  Procedure Laterality Date   AMPUTATION TOE Left 04/25/2022   Procedure: LEFT GREAT TOE AMPUTATION AND LEFT PARTIAL 2ND TOE AMPUTATION;  Surgeon: Edrick Kins, DPM;  Location: [facility] ORS;  Service: Podiatry;  Laterality: Left;   COLONOSCOPY WITH PROPOFOL N/A 02/01/2020   Procedure: COLONOSCOPY WITH PROPOFOL;  Surgeon: Lin Landsman, MD;  Location: [facility] ENDOSCOPY;  Service: Gastroenterology;  Laterality: N/A;   ESOPHAGOGASTRODUODENOSCOPY  02/01/2020   Procedure: ESOPHAGOGASTRODUODENOSCOPY (EGD);  Surgeon: Lin Landsman, MD;  Location: [facility] ENDOSCOPY;  Service: Gastroenterology;;   KNEE SURGERY Right    Torn meniscus   KNEE SURGERY Left     Allergies  Allergen Reactions   Penicillins Other (See Comments)    Childhood allergy -tolerated amoxil 03/2022 Unknown reaction Has patient had a PCN reaction causing immediate rash, facial/tongue/throat swelling, SOB or lightheadedness with hypotension: No Has patient had a PCN reaction causing severe rash involving mucus membranes or skin necrosis: No Has patient had a PCN reaction that required hospitalization No Has patient had a PCN reaction occurring within the last 10 years: No If all of the above answers are "NO", then may proceed with Cephalosporin use.     Objective/Physical Exam Neurovascular status intact.  Incision well coapted with sutures intact.  There is some maceration noted to the great toe amputation site.  No dehiscence. No active bleeding noted.  Moderate  edema noted to the surgical extremity.  Assessment/plan of care: 1. s/p left great toe and partial second toe amputation. DOS: 04/25/2022 -Dressings changed.  Betadine wet-to-dry.  Supplies provided for the patient for dressing changes at home every other day -WBAT cam boot -Antibiotics as per infectious disease.  Discharged on Augmentin x 4 weeks -Patient has follow-up appointment with Dr. Delaine Lame, infectious disease, on 05/19/2022 -Return to clinic 1 week   Edrick Kins, DPM [facility]  Dr. Edrick Kins, DPM    [address], [place] [address]                Office [phone]  Fax [phone]

## 2022-05-06 ENCOUNTER — Telehealth: Payer: Self-pay | Admitting: Oncology

## 2022-05-06 NOTE — Telephone Encounter (Signed)
Attempt made to reach patient, unable to leave voicemail

## 2022-05-08 ENCOUNTER — Ambulatory Visit: Payer: Medicare Other | Admitting: Oncology

## 2022-05-08 ENCOUNTER — Other Ambulatory Visit: Payer: Medicare Other

## 2022-05-08 ENCOUNTER — Ambulatory Visit: Payer: Medicare Other

## 2022-05-12 ENCOUNTER — Other Ambulatory Visit: Payer: Self-pay | Admitting: Oncology

## 2022-05-12 DIAGNOSIS — C9000 Multiple myeloma not having achieved remission: Secondary | ICD-10-CM

## 2022-05-13 ENCOUNTER — Other Ambulatory Visit: Payer: Self-pay

## 2022-05-14 ENCOUNTER — Inpatient Hospital Stay
Admission: EM | Admit: 2022-05-14 | Discharge: 2022-05-28 | DRG: 083 | Disposition: A | Discharge status: Skilled Nursing Facility | Payer: 59 | Attending: Internal Medicine | Admitting: Internal Medicine

## 2022-05-14 ENCOUNTER — Encounter: Payer: Self-pay | Admitting: Emergency Medicine

## 2022-05-14 ENCOUNTER — Emergency Department: Payer: 59

## 2022-05-14 ENCOUNTER — Other Ambulatory Visit: Payer: Self-pay

## 2022-05-14 DIAGNOSIS — Z79899 Other long term (current) drug therapy: Secondary | ICD-10-CM

## 2022-05-14 DIAGNOSIS — E10649 Type 1 diabetes mellitus with hypoglycemia without coma: Secondary | ICD-10-CM | POA: Diagnosis present

## 2022-05-14 DIAGNOSIS — E871 Hypo-osmolality and hyponatremia: Secondary | ICD-10-CM | POA: Diagnosis present

## 2022-05-14 DIAGNOSIS — Z7902 Long term (current) use of antithrombotics/antiplatelets: Secondary | ICD-10-CM

## 2022-05-14 DIAGNOSIS — N401 Enlarged prostate with lower urinary tract symptoms: Secondary | ICD-10-CM | POA: Diagnosis present

## 2022-05-14 DIAGNOSIS — E162 Hypoglycemia, unspecified: Principal | ICD-10-CM

## 2022-05-14 DIAGNOSIS — S065XAA Traumatic subdural hemorrhage with loss of consciousness status unknown, initial encounter: Principal | ICD-10-CM | POA: Diagnosis present

## 2022-05-14 DIAGNOSIS — W19XXXA Unspecified fall, initial encounter: Secondary | ICD-10-CM | POA: Diagnosis present

## 2022-05-14 DIAGNOSIS — Z7151 Drug abuse counseling and surveillance of drug abuser: Secondary | ICD-10-CM

## 2022-05-14 DIAGNOSIS — C9000 Multiple myeloma not having achieved remission: Secondary | ICD-10-CM | POA: Diagnosis present

## 2022-05-14 DIAGNOSIS — E875 Hyperkalemia: Secondary | ICD-10-CM | POA: Diagnosis not present

## 2022-05-14 DIAGNOSIS — R351 Nocturia: Secondary | ICD-10-CM | POA: Diagnosis present

## 2022-05-14 DIAGNOSIS — M869 Osteomyelitis, unspecified: Secondary | ICD-10-CM | POA: Diagnosis present

## 2022-05-14 DIAGNOSIS — E1022 Type 1 diabetes mellitus with diabetic chronic kidney disease: Secondary | ICD-10-CM | POA: Diagnosis present

## 2022-05-14 DIAGNOSIS — Z7982 Long term (current) use of aspirin: Secondary | ICD-10-CM

## 2022-05-14 DIAGNOSIS — R296 Repeated falls: Secondary | ICD-10-CM | POA: Diagnosis present

## 2022-05-14 DIAGNOSIS — S42392A Other fracture of shaft of left humerus, initial encounter for closed fracture: Secondary | ICD-10-CM

## 2022-05-14 DIAGNOSIS — N1831 Chronic kidney disease, stage 3a: Secondary | ICD-10-CM | POA: Diagnosis present

## 2022-05-14 DIAGNOSIS — Z794 Long term (current) use of insulin: Secondary | ICD-10-CM

## 2022-05-14 DIAGNOSIS — E1010 Type 1 diabetes mellitus with ketoacidosis without coma: Secondary | ICD-10-CM

## 2022-05-14 DIAGNOSIS — D638 Anemia in other chronic diseases classified elsewhere: Secondary | ICD-10-CM | POA: Diagnosis present

## 2022-05-14 DIAGNOSIS — Z89412 Acquired absence of left great toe: Secondary | ICD-10-CM

## 2022-05-14 DIAGNOSIS — S42302A Unspecified fracture of shaft of humerus, left arm, initial encounter for closed fracture: Secondary | ICD-10-CM

## 2022-05-14 DIAGNOSIS — E1069 Type 1 diabetes mellitus with other specified complication: Secondary | ICD-10-CM | POA: Diagnosis present

## 2022-05-14 DIAGNOSIS — F1911 Other psychoactive substance abuse, in remission: Secondary | ICD-10-CM | POA: Diagnosis present

## 2022-05-14 DIAGNOSIS — M25512 Pain in left shoulder: Secondary | ICD-10-CM | POA: Diagnosis present

## 2022-05-14 DIAGNOSIS — E7800 Pure hypercholesterolemia, unspecified: Secondary | ICD-10-CM | POA: Diagnosis present

## 2022-05-14 DIAGNOSIS — I951 Orthostatic hypotension: Secondary | ICD-10-CM | POA: Diagnosis not present

## 2022-05-14 DIAGNOSIS — Z7985 Long-term (current) use of injectable non-insulin antidiabetic drugs: Secondary | ICD-10-CM

## 2022-05-14 DIAGNOSIS — Z880 Allergy status to penicillin: Secondary | ICD-10-CM

## 2022-05-14 DIAGNOSIS — N183 Chronic kidney disease, stage 3 unspecified: Secondary | ICD-10-CM | POA: Diagnosis present

## 2022-05-14 DIAGNOSIS — I440 Atrioventricular block, first degree: Secondary | ICD-10-CM | POA: Diagnosis present

## 2022-05-14 DIAGNOSIS — D631 Anemia in chronic kidney disease: Secondary | ICD-10-CM | POA: Diagnosis present

## 2022-05-14 DIAGNOSIS — F1410 Cocaine abuse, uncomplicated: Secondary | ICD-10-CM | POA: Diagnosis present

## 2022-05-14 DIAGNOSIS — I129 Hypertensive chronic kidney disease with stage 1 through stage 4 chronic kidney disease, or unspecified chronic kidney disease: Secondary | ICD-10-CM | POA: Diagnosis present

## 2022-05-14 DIAGNOSIS — Z8673 Personal history of transient ischemic attack (TIA), and cerebral infarction without residual deficits: Secondary | ICD-10-CM

## 2022-05-14 DIAGNOSIS — N179 Acute kidney failure, unspecified: Secondary | ICD-10-CM | POA: Diagnosis present

## 2022-05-14 DIAGNOSIS — I10 Essential (primary) hypertension: Secondary | ICD-10-CM | POA: Diagnosis present

## 2022-05-14 DIAGNOSIS — I160 Hypertensive urgency: Secondary | ICD-10-CM | POA: Diagnosis present

## 2022-05-14 DIAGNOSIS — Z8249 Family history of ischemic heart disease and other diseases of the circulatory system: Secondary | ICD-10-CM

## 2022-05-14 LAB — COMPREHENSIVE METABOLIC PANEL
ALT: 47 U/L — ABNORMAL HIGH (ref 0–44)
AST: 44 U/L — ABNORMAL HIGH (ref 15–41)
Albumin: 3.3 g/dL — ABNORMAL LOW (ref 3.5–5.0)
Alkaline Phosphatase: 82 U/L (ref 38–126)
Anion gap: 12 (ref 5–15)
BUN: 23 mg/dL (ref 8–23)
CO2: 22 mmol/L (ref 22–32)
Calcium: 8.6 mg/dL — ABNORMAL LOW (ref 8.9–10.3)
Chloride: 100 mmol/L (ref 98–111)
Creatinine, Ser: 1.25 mg/dL — ABNORMAL HIGH (ref 0.61–1.24)
GFR, Estimated: >60 mL/min (ref >60)
Glucose, Bld: 90 mg/dL (ref 70–99)
Potassium: 4.3 mmol/L (ref 3.5–5.1)
Sodium: 134 mmol/L — ABNORMAL LOW (ref 135–145)
Total Bilirubin: 0.5 mg/dL (ref 0.3–1.2)
Total Protein: 8.6 g/dL — ABNORMAL HIGH (ref 6.5–8.1)

## 2022-05-14 LAB — URINE DRUG SCREEN, QUALITATIVE (ARMC ONLY)
Amphetamines, Ur Screen: NOT DETECTED
Barbiturates, Ur Screen: NOT DETECTED
Benzodiazepine, Ur Scrn: NOT DETECTED
Cannabinoid 50 Ng, Ur ~~LOC~~: POSITIVE — AB
Cocaine Metabolite,Ur ~~LOC~~: POSITIVE — AB
MDMA (Ecstasy)Ur Screen: NOT DETECTED
Methadone Scn, Ur: NOT DETECTED
Opiate, Ur Screen: POSITIVE — AB
Phencyclidine (PCP) Ur S: NOT DETECTED
Tricyclic, Ur Screen: NOT DETECTED

## 2022-05-14 LAB — CBC WITH DIFFERENTIAL/PLATELET
Abs Immature Granulocytes: 0.02 10*3/uL (ref 0.00–0.07)
Basophils Absolute: 0 10*3/uL (ref 0.0–0.1)
Basophils Relative: 1 %
Eosinophils Absolute: 0.1 10*3/uL (ref 0.0–0.5)
Eosinophils Relative: 2 %
HCT: 28.2 % — ABNORMAL LOW (ref 39.0–52.0)
Hemoglobin: 8.5 g/dL — ABNORMAL LOW (ref 13.0–17.0)
Immature Granulocytes: 1 %
Lymphocytes Relative: 34 %
Lymphs Abs: 1.3 10*3/uL (ref 0.7–4.0)
MCH: 29.2 pg (ref 26.0–34.0)
MCHC: 30.1 g/dL (ref 30.0–36.0)
MCV: 96.9 fL (ref 80.0–100.0)
Monocytes Absolute: 0.5 10*3/uL (ref 0.1–1.0)
Monocytes Relative: 12 %
Neutro Abs: 2 10*3/uL (ref 1.7–7.7)
Neutrophils Relative %: 50 %
Platelets: 361 10*3/uL (ref 150–400)
RBC: 2.91 MIL/uL — ABNORMAL LOW (ref 4.22–5.81)
RDW: 17 % — ABNORMAL HIGH (ref 11.5–15.5)
WBC: 3.9 10*3/uL — ABNORMAL LOW (ref 4.0–10.5)
nRBC: 0 % (ref 0.0–0.2)

## 2022-05-14 LAB — ETHANOL: Alcohol, Ethyl (B): <10 mg/dL (ref <10)

## 2022-05-14 LAB — CBG MONITORING, ED
Glucose-Capillary: 53 mg/dL — ABNORMAL LOW (ref 70–99)
Glucose-Capillary: 81 mg/dL (ref 70–99)
Glucose-Capillary: 137 mg/dL — ABNORMAL HIGH (ref 70–99)
Glucose-Capillary: 109 mg/dL — ABNORMAL HIGH (ref 70–99)

## 2022-05-14 MED ORDER — LEVETIRACETAM 500 MG PO TABS: 500.0000 mg | ORAL_TABLET | Freq: Two times a day (BID) | ORAL | 0 refills | Status: DC

## 2022-05-14 MED ORDER — NICARDIPINE HCL IN NACL 20-0.86 MG/200ML-% IV SOLN
3.0000 mg/h | INTRAVENOUS | Status: DC
  Administered 2022-05-14 – 2022-05-15 (×2): 5 mg/h via INTRAVENOUS
  Administered 2022-05-15: 3 mg/h via INTRAVENOUS
  Filled 2022-05-14 (×4): qty 200

## 2022-05-14 MED ORDER — LEVETIRACETAM IN NACL 500 MG/100ML IV SOLN
500.0000 mg | Freq: Once | INTRAVENOUS | Status: AC
  Administered 2022-05-14: 500 mg via INTRAVENOUS
  Filled 2022-05-14: qty 100

## 2022-05-14 NOTE — Discharge Instructions (Addendum)
Take Keppra as prescribed for seizure prevention.  Take your diabetes medications as prescribed and remember to eat food.   Thank you for choosing [place] for your health care today!  Please see your primary doctor this week for a follow up appointment.   Sometimes, in the early stages of certain disease courses it is difficult to detect in the emergency department evaluation -- so, it is important that you continue to monitor your symptoms and call your doctor right away or return to the emergency department if you develop any new or worsening symptoms.  Please go to the following website to schedule new (and existing) patient appointments:   [email or web address]  If you do not have a primary doctor try calling the following clinics to establish care:  If you have insurance:  [facility] [phone] [place] [address]   [facility]  [phone] [address] [place] [address]   If you do not have insurance:  [facility]  [phone] [address] [place] [address]   The following is another list of primary care offices in the area who are accepting new patients at this time.  Please reach out to one of them directly and let them know you would like to schedule an appointment to follow up on an Emergency Department visit, and/or to establish a new primary care provider (PCP).  There are likely other primary care clinics in the are who are accepting new patients, but this is an excellent place to start:  [place] physician: Dr Lavon Paganini [address] [place], [place] [address] [phone]  [facility] Lead Physician: Dr Steele Sizer [address], [place], [place] [address] [phone]  [place] [facility] Physician: Dr Park Liter [address] [place], [place] [address] [phone]  [facility] Lead Physician: Dr Dewaine Oats [place], [place], [place] [address] [phone]  [facility] Physician: Dr Halina Maidens [address] [place], [place] [address] [phone]   It was my pleasure to care for you today.   [place] Brunette Jacelyn Grip, MD

## 2022-05-14 NOTE — ED Triage Notes (Signed)
  Patient BIB EMS for hypoglycemia and recent falls.  Patient was unresponsive on EMS arrival and CBG was 38.  Glucagon given and patient became more alert.  Glucose gel given en route to ED.  CBG 53 on arrival.  Patient alert but confused.  Given 8oz OJ PO.  EMS states patient has had multiple falls but patient denies any pain.  Denies any blood thinners.

## 2022-05-14 NOTE — ED Notes (Signed)
Provided 2 orange juice containers, pt drank all of it

## 2022-05-14 NOTE — ED Notes (Signed)
Provided another carton of OJ

## 2022-05-14 NOTE — ED Provider Notes (Addendum)
11:00 PM  Assumed care at shift change.  Awaitng repeat head Ct for 12m SDH at 3am.  Also monitoring CBGs and BP closely.  12:30 AM  Pt's blood pressure improved significantly on Cardene infusion.  Blood sugar is however continued to drop despite him being awake and eating and drinking.  Will start D10 infusion and admit.  I have sent a secure chat to neurosurgery on-call to update them.  Repeat head CT has been ordered for 3 AM.   Consulted and discussed patient's case with hospitalist, Dr. PPosey Pronto  I have recommended admission and consulting physician agrees and will place admission orders.  Patient (and family if present) agree with this plan.   I reviewed all nursing notes, vitals, pertinent previous records.  All labs, EKGs, imaging ordered have been independently reviewed and interpreted by myself.    CRITICAL CARE Performed by: KPryor Curia  Total critical care time: 30 minutes  Critical care time was exclusive of separately billable procedures and treating other patients.  Critical care was necessary to treat or prevent imminent or life-threatening deterioration.  Critical care was time spent personally by me on the following activities: development of treatment plan with patient and/or surrogate as well as nursing, discussions with consultants, evaluation of patient's response to treatment, examination of patient, obtaining history from patient or surrogate, ordering and performing treatments and interventions, ordering and review of laboratory studies, ordering and review of radiographic studies, pulse oximetry and re-evaluation of patient's condition.    Jadence Kinlaw, KDelice Bison DO 05/15/22 0041   1:20 AM  Patient complaining of left shoulder and humerus pain from his fall.  Compartments soft and neurovascular intact distally.  X-rays were obtained and reviewed/interpreted by myself and the radiologist and showed findings of a fracture deformity at the greater tubercle of the left  humeral head.  He is point tender over this area.  Will place in a sling.  He can follow-up with orthopedics as an outpatient.  Updated hospitalist.   Nick Armel, KDelice Bison DO 05/15/22 0135   2:16 AM  Pt's repeat head CT shows no change.  He is still awake, alert, neurologically intact.   Tayvon Culley, KDelice Bison DO 05/15/22 [phone]

## 2022-05-14 NOTE — ED Provider Notes (Signed)
[facility] Provider Note    Event Date/Time   First MD Initiated Contact with Patient 05/14/22 2012     (approximate)   History   Hypoglycemia and Fall   HPI  Nicholas Weiss is a 72 y.o. male   Past medical history of poorly controlled type I diabetic with frequent hospitalizations for DKA or hypoglycemia, hypertension, hypercholesterolemia, multiple myeloma, prior stroke, who presents to the emergency department with hypoglycemia.  He is quite circumferential with his history, which hinders my interview, but is able to tell me he had felt like his blood sugar was low leading to lightheadedness and a presyncopal episode where he fell forward onto both of his knees and injured both of his knees and fell forward onto his face giving him a bruised left eye.  He is unsure whether he lost consciousness.  He states that he has otherwise been in his regular state of health and has been taking all of his diabetic medications and has had no recent illnesses.  He believes that he ate his meals this morning but is unsure.  He feels well now aside from pain in both of his knees from his fall.    He denies fevers chills respiratory infectious symptoms GI or GU complaints.   External Medical Documents Reviewed: Discharge summary from 04/29/2022 for DKA      Physical Exam   Triage Vital Signs: ED Triage Vitals  Enc Vitals Group     BP      Pulse      Resp      Temp      Temp src      SpO2      Weight      Height      Head Circumference      Peak Flow      Pain Score      Pain Loc      Pain Edu?      Excl. in [place]?     Most recent vital signs: Vitals:   05/14/22 2200 05/14/22 2222  BP: (!) 197/125 (!) 153/107  Pulse: 80 80  Resp: 12 20  Temp:    SpO2: 100% 100%    General: Awake, no distress.  CV:  Good peripheral perfusion.  Resp:  Normal effort.  Abd:  No distention.  Other:  He has bilateral knee pain he is able to range gingerly due to  pain and also ecchymosis around the left eye.  He otherwise alert oriented comfortable appearing conversant moving all extremities lungs clear abdomen soft and nontender skin appears warm well-perfused hypertensive 180/100 afebrile.   ED Results / Procedures / Treatments   Labs (all labs ordered are listed, but only abnormal results are displayed) Labs Reviewed  COMPREHENSIVE METABOLIC PANEL - Abnormal; Notable for the following components:      Result Value   Sodium 134 (*)    Creatinine, Ser 1.25 (*)    Calcium 8.6 (*)    Total Protein 8.6 (*)    Albumin 3.3 (*)    AST 44 (*)    ALT 47 (*)    All other components within normal limits  CBC WITH DIFFERENTIAL/PLATELET - Abnormal; Notable for the following components:   WBC 3.9 (*)    RBC 2.91 (*)    Hemoglobin 8.5 (*)    HCT 28.2 (*)    RDW 17.0 (*)    All other components within normal limits  URINE DRUG SCREEN, QUALITATIVE [facility]  ONLY) - Abnormal; Notable for the following components:   Cocaine Metabolite,Ur [place] POSITIVE (*)    Opiate, Ur Screen POSITIVE (*)    Cannabinoid 50 Ng, Ur [place] POSITIVE (*)    All other components within normal limits  CBG MONITORING, ED - Abnormal; Notable for the following components:   Glucose-Capillary 53 (*)    All other components within normal limits  CBG MONITORING, ED - Abnormal; Notable for the following components:   Glucose-Capillary 137 (*)    All other components within normal limits  CBG MONITORING, ED - Abnormal; Notable for the following components:   Glucose-Capillary 109 (*)    All other components within normal limits  ETHANOL  CBG MONITORING, ED  CBG MONITORING, ED  CBG MONITORING, ED  CBG MONITORING, ED  CBG MONITORING, ED  CBG MONITORING, ED  CBG MONITORING, ED  CBG MONITORING, ED  CBG MONITORING, ED     I ordered and reviewed the above labs they are notable for his initial blood glucose is 53, given orange juice.  EKG  ED ECG REPORT I, Lucillie Garfinkel, the attending  physician, personally viewed and interpreted this ECG.   Date: 05/14/2022  EKG Time: 2035  Rate: 91  Rhythm: ?sinus vs accelerated jct rhythm  Axis: nl  Intervals:long QT 521  ST&T Change: no acute ischemic changes    RADIOLOGY I independently reviewed and interpreted CT of the head see subdural in the right side no midline shift   PROCEDURES:  Critical Care performed: Yes, see critical care procedure note(s)  .Critical Care  Performed by: Lucillie Garfinkel, MD Authorized by: Lucillie Garfinkel, MD   Critical care provider statement:    Critical care time (minutes):  30   Critical care was time spent personally by me on the following activities:  Development of treatment plan with patient or surrogate, discussions with consultants, evaluation of patient's response to treatment, examination of patient, ordering and review of laboratory studies, ordering and review of radiographic studies, ordering and performing treatments and interventions, pulse oximetry, re-evaluation of patient's condition and review of old charts    MEDICATIONS ORDERED IN ED: Medications  levETIRAcetam (KEPPRA) IVPB 500 mg/100 mL premix (has no administration in time range)  nicardipine (CARDENE) 59m in 0.86% saline 2052mIV infusion (0.1 mg/ml) (7.5 mg/hr Intravenous Rate/Dose Change 05/14/22 2233)    External physician / consultants:  I spoke with Dr. YaCari Carawayf neurosurgery regarding care plan for this patient.   IMPRESSION / MDM / ASSESSMENT AND PLAN / ED COURSE  I reviewed the triage vital signs and the nursing notes.                                Patient's presentation is most consistent with acute presentation with potential threat to life or bodily function.  Differential diagnosis includes, but is not limited to, hypoglycemia due to medications, poor p.o. intake, electrolyte disturbance, traumatic injury including knee fracture dislocation, intracranial bleeding, infection, considered but less  likely septic joint given traumatic injury leading to pain.   The patient is on the cardiac monitor to evaluate for evidence of arrhythmia and/or significant heart rate changes.  MDM:  SDH on CT no midline shift and neuro intact after his hypoglycemia has been corrected.  Will recheck vital signs because his triage vitals were high systolic, blood pressure control under 160 as needed.  Frequent CBG check.  Repeat head scan at 3 AM.  Keppra  500 twice daily.  Spoke with Dr Elayne Guerin about the plan as above.  Signed out to oncoming provider in stable condition.       FINAL CLINICAL IMPRESSION(S) / ED DIAGNOSES   Final diagnoses:  Hypoglycemia  Subdural hematoma ([place])     Rx / DC Orders   ED Discharge Orders          Ordered    levETIRAcetam (KEPPRA) 500 MG tablet  2 times daily        05/14/22 2145             Note:  This document was prepared using Dragon voice recognition software and may include unintentional dictation errors.    Lucillie Garfinkel, MD 05/14/22 [phone]

## 2022-05-15 ENCOUNTER — Encounter: Payer: Self-pay | Admitting: Internal Medicine

## 2022-05-15 ENCOUNTER — Inpatient Hospital Stay: Payer: 59

## 2022-05-15 ENCOUNTER — Emergency Department: Payer: 59

## 2022-05-15 DIAGNOSIS — Z89412 Acquired absence of left great toe: Secondary | ICD-10-CM | POA: Diagnosis not present

## 2022-05-15 DIAGNOSIS — N1831 Chronic kidney disease, stage 3a: Secondary | ICD-10-CM | POA: Diagnosis present

## 2022-05-15 DIAGNOSIS — D631 Anemia in chronic kidney disease: Secondary | ICD-10-CM | POA: Diagnosis present

## 2022-05-15 DIAGNOSIS — Z880 Allergy status to penicillin: Secondary | ICD-10-CM | POA: Diagnosis not present

## 2022-05-15 DIAGNOSIS — I10 Essential (primary) hypertension: Secondary | ICD-10-CM

## 2022-05-15 DIAGNOSIS — S065XAA Traumatic subdural hemorrhage with loss of consciousness status unknown, initial encounter: Secondary | ICD-10-CM | POA: Diagnosis present

## 2022-05-15 DIAGNOSIS — E10649 Type 1 diabetes mellitus with hypoglycemia without coma: Secondary | ICD-10-CM | POA: Diagnosis present

## 2022-05-15 DIAGNOSIS — C9000 Multiple myeloma not having achieved remission: Secondary | ICD-10-CM | POA: Diagnosis present

## 2022-05-15 DIAGNOSIS — I160 Hypertensive urgency: Secondary | ICD-10-CM | POA: Diagnosis present

## 2022-05-15 DIAGNOSIS — F1410 Cocaine abuse, uncomplicated: Secondary | ICD-10-CM | POA: Diagnosis present

## 2022-05-15 DIAGNOSIS — N401 Enlarged prostate with lower urinary tract symptoms: Secondary | ICD-10-CM | POA: Diagnosis present

## 2022-05-15 DIAGNOSIS — R296 Repeated falls: Secondary | ICD-10-CM | POA: Diagnosis present

## 2022-05-15 DIAGNOSIS — I440 Atrioventricular block, first degree: Secondary | ICD-10-CM | POA: Diagnosis present

## 2022-05-15 DIAGNOSIS — I951 Orthostatic hypotension: Secondary | ICD-10-CM | POA: Diagnosis not present

## 2022-05-15 DIAGNOSIS — W19XXXD Unspecified fall, subsequent encounter: Secondary | ICD-10-CM | POA: Diagnosis not present

## 2022-05-15 DIAGNOSIS — N179 Acute kidney failure, unspecified: Secondary | ICD-10-CM | POA: Diagnosis present

## 2022-05-15 DIAGNOSIS — E871 Hypo-osmolality and hyponatremia: Secondary | ICD-10-CM | POA: Diagnosis not present

## 2022-05-15 DIAGNOSIS — E875 Hyperkalemia: Secondary | ICD-10-CM | POA: Diagnosis not present

## 2022-05-15 DIAGNOSIS — I129 Hypertensive chronic kidney disease with stage 1 through stage 4 chronic kidney disease, or unspecified chronic kidney disease: Secondary | ICD-10-CM | POA: Diagnosis present

## 2022-05-15 DIAGNOSIS — M869 Osteomyelitis, unspecified: Secondary | ICD-10-CM | POA: Diagnosis present

## 2022-05-15 DIAGNOSIS — W19XXXA Unspecified fall, initial encounter: Secondary | ICD-10-CM

## 2022-05-15 DIAGNOSIS — Z794 Long term (current) use of insulin: Secondary | ICD-10-CM | POA: Diagnosis not present

## 2022-05-15 DIAGNOSIS — M25512 Pain in left shoulder: Secondary | ICD-10-CM | POA: Diagnosis present

## 2022-05-15 DIAGNOSIS — E7800 Pure hypercholesterolemia, unspecified: Secondary | ICD-10-CM | POA: Diagnosis present

## 2022-05-15 DIAGNOSIS — E1069 Type 1 diabetes mellitus with other specified complication: Secondary | ICD-10-CM | POA: Diagnosis present

## 2022-05-15 DIAGNOSIS — E1022 Type 1 diabetes mellitus with diabetic chronic kidney disease: Secondary | ICD-10-CM | POA: Diagnosis present

## 2022-05-15 DIAGNOSIS — E162 Hypoglycemia, unspecified: Secondary | ICD-10-CM | POA: Diagnosis not present

## 2022-05-15 DIAGNOSIS — S42392A Other fracture of shaft of left humerus, initial encounter for closed fracture: Secondary | ICD-10-CM | POA: Diagnosis not present

## 2022-05-15 DIAGNOSIS — R351 Nocturia: Secondary | ICD-10-CM | POA: Diagnosis present

## 2022-05-15 LAB — CBC
HCT: 25.5 % — ABNORMAL LOW (ref 39.0–52.0)
HCT: 24.7 % — ABNORMAL LOW (ref 39.0–52.0)
Hemoglobin: 7.9 g/dL — ABNORMAL LOW (ref 13.0–17.0)
Hemoglobin: 7.6 g/dL — ABNORMAL LOW (ref 13.0–17.0)
MCH: 29.2 pg (ref 26.0–34.0)
MCH: 29 pg (ref 26.0–34.0)
MCHC: 31 g/dL (ref 30.0–36.0)
MCHC: 30.8 g/dL (ref 30.0–36.0)
MCV: 94.1 fL (ref 80.0–100.0)
MCV: 94.3 fL (ref 80.0–100.0)
Platelets: 323 10*3/uL (ref 150–400)
Platelets: 306 10*3/uL (ref 150–400)
RBC: 2.71 MIL/uL — ABNORMAL LOW (ref 4.22–5.81)
RBC: 2.62 MIL/uL — ABNORMAL LOW (ref 4.22–5.81)
RDW: 16.9 % — ABNORMAL HIGH (ref 11.5–15.5)
RDW: 16.8 % — ABNORMAL HIGH (ref 11.5–15.5)
WBC: 5.3 10*3/uL (ref 4.0–10.5)
WBC: 4.4 10*3/uL (ref 4.0–10.5)
nRBC: 0 % (ref 0.0–0.2)
nRBC: 0 % (ref 0.0–0.2)

## 2022-05-15 LAB — TYPE AND SCREEN
ABO/RH(D): A POS
Antibody Screen: NEGATIVE

## 2022-05-15 LAB — COMPREHENSIVE METABOLIC PANEL
ALT: 39 U/L (ref 0–44)
AST: 29 U/L (ref 15–41)
Albumin: 3 g/dL — ABNORMAL LOW (ref 3.5–5.0)
Alkaline Phosphatase: 74 U/L (ref 38–126)
Anion gap: 7 (ref 5–15)
BUN: 24 mg/dL — ABNORMAL HIGH (ref 8–23)
CO2: 25 mmol/L (ref 22–32)
Calcium: 8.2 mg/dL — ABNORMAL LOW (ref 8.9–10.3)
Chloride: 99 mmol/L (ref 98–111)
Creatinine, Ser: 1.06 mg/dL (ref 0.61–1.24)
GFR, Estimated: >60 mL/min (ref >60)
Glucose, Bld: 104 mg/dL — ABNORMAL HIGH (ref 70–99)
Potassium: 4.3 mmol/L (ref 3.5–5.1)
Sodium: 131 mmol/L — ABNORMAL LOW (ref 135–145)
Total Bilirubin: 0.6 mg/dL (ref 0.3–1.2)
Total Protein: 7.8 g/dL (ref 6.5–8.1)

## 2022-05-15 LAB — CBG MONITORING, ED
Glucose-Capillary: 123 mg/dL — ABNORMAL HIGH (ref 70–99)
Glucose-Capillary: 194 mg/dL — ABNORMAL HIGH (ref 70–99)
Glucose-Capillary: 225 mg/dL — ABNORMAL HIGH (ref 70–99)
Glucose-Capillary: 33 mg/dL — CL (ref 70–99)
Glucose-Capillary: 76 mg/dL (ref 70–99)
Glucose-Capillary: 81 mg/dL (ref 70–99)
Glucose-Capillary: 81 mg/dL (ref 70–99)
Glucose-Capillary: 82 mg/dL (ref 70–99)
Glucose-Capillary: 94 mg/dL (ref 70–99)

## 2022-05-15 LAB — GLUCOSE, CAPILLARY
Glucose-Capillary: 127 mg/dL — ABNORMAL HIGH (ref 70–99)
Glucose-Capillary: 153 mg/dL — ABNORMAL HIGH (ref 70–99)
Glucose-Capillary: 393 mg/dL — ABNORMAL HIGH (ref 70–99)

## 2022-05-15 LAB — CREATININE, SERUM
Creatinine, Ser: 1.03 mg/dL (ref 0.61–1.24)
GFR, Estimated: >60 mL/min (ref >60)

## 2022-05-15 LAB — MAGNESIUM: Magnesium: 2.2 mg/dL (ref 1.7–2.4)

## 2022-05-15 LAB — TROPONIN I (HIGH SENSITIVITY)
Troponin I (High Sensitivity): 17 ng/L (ref <18)
Troponin I (High Sensitivity): 19 ng/L — ABNORMAL HIGH (ref <18)

## 2022-05-15 MED ORDER — LACTATED RINGERS IV SOLN: INTRAVENOUS | Status: DC

## 2022-05-15 MED ORDER — SODIUM CHLORIDE 0.9% FLUSH
3.0000 mL | Freq: Two times a day (BID) | INTRAVENOUS | Status: DC
  Administered 2022-05-15 – 2022-05-28 (×26): 3 mL via INTRAVENOUS

## 2022-05-15 MED ORDER — AMOXICILLIN-POT CLAVULANATE 875-125 MG PO TABS
1.0000 | ORAL_TABLET | Freq: Two times a day (BID) | ORAL | Status: AC
  Administered 2022-05-15 – 2022-05-23 (×18): 1 via ORAL
  Filled 2022-05-15 (×18): qty 1

## 2022-05-15 MED ORDER — CHLORHEXIDINE GLUCONATE CLOTH 2 % EX PADS
6.0000 | MEDICATED_PAD | Freq: Every day | CUTANEOUS | Status: DC
  Administered 2022-05-15 – 2022-05-16 (×2): 6 via TOPICAL

## 2022-05-15 MED ORDER — THIAMINE HCL 100 MG/ML IJ SOLN
100.0000 mg | INTRAMUSCULAR | Status: DC
  Administered 2022-05-15 – 2022-05-18 (×4): 100 mg via INTRAVENOUS
  Filled 2022-05-15 (×4): qty 2

## 2022-05-15 MED ORDER — DEXTROSE 10 % IV SOLN: INTRAVENOUS | Status: DC

## 2022-05-15 MED ORDER — MORPHINE SULFATE (PF) 2 MG/ML IV SOLN: 2.0000 mg | INTRAVENOUS | Status: DC | PRN

## 2022-05-15 MED ORDER — VITAMIN C 500 MG PO TABS
500.0000 mg | ORAL_TABLET | Freq: Every day | ORAL | Status: DC
  Administered 2022-05-15 – 2022-05-28 (×14): 500 mg via ORAL
  Filled 2022-05-15 (×14): qty 1

## 2022-05-15 MED ORDER — ACETAMINOPHEN 500 MG PO TABS
1000.0000 mg | ORAL_TABLET | Freq: Once | ORAL | Status: AC
  Administered 2022-05-15: 1000 mg via ORAL
  Filled 2022-05-15: qty 2

## 2022-05-15 MED ORDER — ACETAMINOPHEN 650 MG RE SUPP: 650.0000 mg | Freq: Four times a day (QID) | RECTAL | Status: DC | PRN

## 2022-05-15 MED ORDER — HYDROCODONE-ACETAMINOPHEN 5-325 MG PO TABS
1.0000 | ORAL_TABLET | ORAL | Status: DC | PRN
  Administered 2022-05-15: 1 via ORAL
  Filled 2022-05-15: qty 1

## 2022-05-15 MED ORDER — LEVETIRACETAM IN NACL 500 MG/100ML IV SOLN
500.0000 mg | Freq: Two times a day (BID) | INTRAVENOUS | Status: DC
  Administered 2022-05-15 – 2022-05-18 (×6): 500 mg via INTRAVENOUS
  Filled 2022-05-15 (×6): qty 100

## 2022-05-15 MED ORDER — HEPARIN SODIUM (PORCINE) 5000 UNIT/ML IJ SOLN: 5000.0000 [IU] | Freq: Three times a day (TID) | INTRAMUSCULAR | Status: DC

## 2022-05-15 MED ORDER — ASPIRIN 81 MG PO TBEC: 81.0000 mg | DELAYED_RELEASE_TABLET | Freq: Every morning | ORAL | Status: DC

## 2022-05-15 MED ORDER — PANTOPRAZOLE SODIUM 40 MG IV SOLR
40.0000 mg | Freq: Two times a day (BID) | INTRAVENOUS | Status: DC
  Administered 2022-05-15 – 2022-05-18 (×8): 40 mg via INTRAVENOUS
  Filled 2022-05-15 (×8): qty 10

## 2022-05-15 MED ORDER — INSULIN ASPART 100 UNIT/ML IJ SOLN
0.0000 [IU] | Freq: Three times a day (TID) | INTRAMUSCULAR | Status: DC
  Administered 2022-05-15: 3 [IU] via SUBCUTANEOUS
  Administered 2022-05-16: 15 [IU] via SUBCUTANEOUS
  Filled 2022-05-15 (×2): qty 1

## 2022-05-15 MED ORDER — LEVETIRACETAM IN NACL 500 MG/100ML IV SOLN
500.0000 mg | Freq: Two times a day (BID) | INTRAVENOUS | Status: DC
  Administered 2022-05-15: 500 mg via INTRAVENOUS
  Filled 2022-05-15: qty 100

## 2022-05-15 MED ORDER — ACETAMINOPHEN 500 MG PO TABS: 1000.0000 mg | ORAL_TABLET | Freq: Every day | ORAL | Status: DC | PRN

## 2022-05-15 MED ORDER — CLOPIDOGREL BISULFATE 75 MG PO TABS: 75.0000 mg | ORAL_TABLET | Freq: Every morning | ORAL | Status: DC

## 2022-05-15 MED ORDER — ATORVASTATIN CALCIUM 20 MG PO TABS: 80.0000 mg | ORAL_TABLET | Freq: Every day | ORAL | Status: DC

## 2022-05-15 MED ORDER — ACETAMINOPHEN 325 MG PO TABS
650.0000 mg | ORAL_TABLET | Freq: Four times a day (QID) | ORAL | Status: DC | PRN
  Administered 2022-05-16 – 2022-05-28 (×26): 650 mg via ORAL
  Filled 2022-05-15 (×26): qty 2

## 2022-05-15 MED ORDER — ONDANSETRON HCL 4 MG/2ML IJ SOLN: 4.0000 mg | Freq: Four times a day (QID) | INTRAMUSCULAR | Status: DC | PRN

## 2022-05-15 NOTE — ED Notes (Signed)
Pt given 8oz of orange juice

## 2022-05-15 NOTE — Assessment & Plan Note (Signed)
Continue keppra and NS will see pt in am. We will admit pt in stepdown.

## 2022-05-15 NOTE — Assessment & Plan Note (Signed)
PT/ STR ? Placement  Pt sustained head injury and ct shows SDH. ; IMPRESSION: 1. Small right frontal subdural hematoma measuring 7 mm in thickness with mild mass effect upon the subjacent right frontal cortical gyri. No midline shift. 2. Stable mild senescent change. Electronically Signed: By: Fidela Salisbury M.D. On: 05/14/2022 21:34

## 2022-05-15 NOTE — Inpatient Diabetes Management (Signed)
Inpatient Diabetes Program Recommendations  AACE/ADA: New Consensus Statement on Inpatient Glycemic Control   Target Ranges:  Prepandial:   less than 140 mg/dL      Peak postprandial:   less than 180 mg/dL (1-2 hours)      Critically ill patients:  140 - 180 mg/dL    Latest Reference Range & Units 05/15/22 00:07 05/15/22 01:06 05/15/22 02:04 05/15/22 02:45 05/15/22 03:53 05/15/22 04:58 05/15/22 08:04  Glucose-Capillary 70 - 99 mg/dL 81 82 225 (H) 76 81 94 194 (H)  Novolog 3 units    Latest Reference Range & Units 05/14/22 20:11 05/14/22 20:38 05/14/22 22:03 05/14/22 23:11  Glucose-Capillary 70 - 99 mg/dL 53 (L) 81 137 (H) 109 (H)   Review of Glycemic Control  Diabetes history:  DM1 Outpatient Diabetes medications: Tresiba 20 units daily, Novolog 0-9 units TID, Farxiga 10 mg daily Current orders for Inpatient glycemic control: Novolog 0-15 units TID with meals  Inpatient Diabetes Program Recommendations:    Insulin: Please consider decreasing Novolog to 0-9 units and change frequency to Q4H. Once glucose is consistently over 180 mg/dl, would recommend ordering low dose basal (Levemir 7 units Q24H) since patient has Type 1 DM and makes NO insulin.  NOTE: Patient currently in ED with hypoglycemia and fall with sustained head infury and CT shows SDH; toxicology positive for cocaine, marijuana, and opiates. Patient has DM1 and is known to inpatient diabetes team. Patient was most recently admitted 04/21/22-04/29/22.   Thanks, Barnie Alderman, RN, MSN, [facility] Coordinator Inpatient Diabetes Program [phone] (Team Pager from 8am to [place])

## 2022-05-15 NOTE — H&P (Addendum)
History and Physical    Chief Complaint: Hypoglycemia. Fall.   HISTORY OF PRESENT ILLNESS: Nicholas Weiss is an 72 y.o. male seen for type DM I, htn, low blood sugar and h/o DKA, Pt has SDH 7 mm. Dr Cari Caraway, Demetrios Isaacs started for prophylaxis bid 500 mg bid x 7 days. Pt has pmh og MM, a.fib, recurrent DKA, 1 degree AV block, CKD stage III, cva, c./h anemia , htn. Hld, cocaine abuse.   Pt has  Past Medical History:  Diagnosis Date   Acute metabolic encephalopathy [id number]   DKA (diabetic ketoacidoses) 04/06/2016   Hypercholesteremia    Hypertension      Review of Systems  Neurological:  Positive for weakness and light-headedness.   Allergies  Allergen Reactions   Penicillins Other (See Comments)    Childhood allergy -tolerated amoxil 03/2022 Unknown reaction Has patient had a PCN reaction causing immediate rash, facial/tongue/throat swelling, SOB or lightheadedness with hypotension: No Has patient had a PCN reaction causing severe rash involving mucus membranes or skin necrosis: No Has patient had a PCN reaction that required hospitalization No Has patient had a PCN reaction occurring within the last 10 years: No If all of the above answers are "NO", then may proceed with Cephalosporin use.    Past Surgical History:  Procedure Laterality Date   AMPUTATION TOE Left 04/25/2022   Procedure: LEFT GREAT TOE AMPUTATION AND LEFT PARTIAL 2ND TOE AMPUTATION;  Surgeon: Edrick Kins, DPM;  Location: [facility] ORS;  Service: Podiatry;  Laterality: Left;   COLONOSCOPY WITH PROPOFOL N/A 02/01/2020   Procedure: COLONOSCOPY WITH PROPOFOL;  Surgeon: Lin Landsman, MD;  Location: [facility] ENDOSCOPY;  Service: Gastroenterology;  Laterality: N/A;   ESOPHAGOGASTRODUODENOSCOPY  02/01/2020   Procedure: ESOPHAGOGASTRODUODENOSCOPY (EGD);  Surgeon: Lin Landsman, MD;  Location: [facility] ENDOSCOPY;  Service: Gastroenterology;;   KNEE SURGERY Right    Torn meniscus   KNEE SURGERY Left         MEDICATIONS: Current Outpatient Medications  Medication Instructions   acetaminophen (TYLENOL) 1,000 mg, Oral, Daily PRN   acyclovir (ZOVIRAX) 400 mg, Oral, 2 times daily   amoxicillin-clavulanate (AUGMENTIN) 875-125 MG tablet 1 tablet, Oral, 2 times daily   ascorbic acid (VITAMIN C) 500 mg, Oral, Daily   aspirin EC 81 mg, Oral, Every morning   atorvastatin (LIPITOR) 80 mg, Oral, Daily at bedtime, Hold while taking Paxlovid   Baclofen 5 mg, Oral, Daily PRN   clopidogrel (PLAVIX) 75 mg, Oral, Every morning   docusate sodium (COLACE) 100 mg, Oral, Daily PRN   Farxiga 10 mg, Oral, Every morning   feeding supplement, GLUCERNA SHAKE, (GLUCERNA SHAKE) LIQD 237 mLs, Oral, 3 times daily between meals   ferrous sulfate 325 mg, Oral, Daily with breakfast   gabapentin (NEURONTIN) 300-600 mg, Oral, See admin instructions, 300 mg in the morning, 600 mg at night.   guaiFENesin-dextromethorphan (ROBITUSSIN DM) 100-10 MG/5ML syrup 10 mLs, Oral, Every 4 hours PRN   HYDROcodone-acetaminophen (NORCO/VICODIN) 5-325 MG tablet 1 tablet, Oral, Every 6 hours PRN   levETIRAcetam (KEPPRA) 500 mg, Oral, 2 times daily   losartan (COZAAR) 25 mg, Oral, Daily   Multiple Vitamin (MULTIVITAMIN WITH MINERALS) TABS tablet 1 tablet, Oral, Daily   NovoLOG 0-9 Units, Subcutaneous, 3 times daily with meals   ondansetron (ZOFRAN) 8 mg, Oral, Daily PRN   [name] FlexTouch 15 Units, Subcutaneous, Daily   zinc sulfate 220 mg, Oral, Daily     ascorbic acid  500 mg Oral Daily   insulin aspart  0-15 Units Subcutaneous TID WC   pantoprazole (PROTONIX) IV  40 mg Intravenous Q12H   sodium chloride flush  3 mL Intravenous Q12H   thiamine (VITAMIN B1) injection  100 mg Intravenous Q24H     dextrose 75 mL/hr at 05/15/22 0014   levETIRAcetam     niCARDipine 5 mg/hr (05/15/22 0015)        ED Course: Pt in Ed alert, awake, oriented, afebrile, nonfocal. Vitals:   05/15/22 0015 05/15/22 0030 05/15/22 0100 05/15/22 0115  BP:  124/75 129/76 128/83 126/77  Pulse: 80 79 77 77  Resp: 14 13 14 10  $ Temp:    98 F (36.7 C)  TempSrc:    Oral  SpO2: 100% 100% 100% 100%  Weight:      Height:        No intake/output data recorded. SpO2: 100 % Blood work in ed shows: Blood glucose of 53, Serum creatinine of 1.25, mild elevation of AST at 44 and ALT of 47. CBC shows a white count of 3.9 hemoglobin of 8.5 platelet count is 361. Alcohol level is less than 10, urine drug screen is positive for cocaine and THC and opiates. . Results for orders placed or performed during the hospital encounter of 05/14/22 (from the past 24 hour(s))  CBG monitoring, ED     Status: Abnormal   Collection Time: 05/14/22  8:11 PM  Result Value Ref Range   Glucose-Capillary 53 (L) 70 - 99 mg/dL  Comprehensive metabolic panel     Status: Abnormal   Collection Time: 05/14/22  8:30 PM  Result Value Ref Range   Sodium 134 (L) 135 - 145 mmol/L   Potassium 4.3 3.5 - 5.1 mmol/L   Chloride 100 98 - 111 mmol/L   CO2 22 22 - 32 mmol/L   Glucose, Bld 90 70 - 99 mg/dL   BUN 23 8 - 23 mg/dL   Creatinine, Ser 1.25 (H) 0.61 - 1.24 mg/dL   Calcium 8.6 (L) 8.9 - 10.3 mg/dL   Total Protein 8.6 (H) 6.5 - 8.1 g/dL   Albumin 3.3 (L) 3.5 - 5.0 g/dL   AST 44 (H) 15 - 41 U/L   ALT 47 (H) 0 - 44 U/L   Alkaline Phosphatase 82 38 - 126 U/L   Total Bilirubin 0.5 0.3 - 1.2 mg/dL   GFR, Estimated >60 >60 mL/min   Anion gap 12 5 - 15  CBC with Differential     Status: Abnormal   Collection Time: 05/14/22  8:30 PM  Result Value Ref Range   WBC 3.9 (L) 4.0 - 10.5 K/uL   RBC 2.91 (L) 4.22 - 5.81 MIL/uL   Hemoglobin 8.5 (L) 13.0 - 17.0 g/dL   HCT 28.2 (L) 39.0 - 52.0 %   MCV 96.9 80.0 - 100.0 fL   MCH 29.2 26.0 - 34.0 pg   MCHC 30.1 30.0 - 36.0 g/dL   RDW 17.0 (H) 11.5 - 15.5 %   Platelets 361 150 - 400 K/uL   nRBC 0.0 0.0 - 0.2 %   Neutrophils Relative % 50 %   Neutro Abs 2.0 1.7 - 7.7 K/uL   Lymphocytes Relative 34 %   Lymphs Abs 1.3 0.7 - 4.0 K/uL    Monocytes Relative 12 %   Monocytes Absolute 0.5 0.1 - 1.0 K/uL   Eosinophils Relative 2 %   Eosinophils Absolute 0.1 0.0 - 0.5 K/uL   Basophils Relative 1 %   Basophils Absolute 0.0 0.0 - 0.1 K/uL  Immature Granulocytes 1 %   Abs Immature Granulocytes 0.02 0.00 - 0.07 K/uL  Magnesium     Status: None   Collection Time: 05/14/22  8:30 PM  Result Value Ref Range   Magnesium 2.2 1.7 - 2.4 mg/dL  CBG monitoring, ED     Status: None   Collection Time: 05/14/22  8:38 PM  Result Value Ref Range   Glucose-Capillary 81 70 - 99 mg/dL  Ethanol     Status: None   Collection Time: 05/14/22  8:50 PM  Result Value Ref Range   Alcohol, Ethyl (B) <10 <10 mg/dL  POC CBG, ED     Status: Abnormal   Collection Time: 05/14/22 10:03 PM  Result Value Ref Range   Glucose-Capillary 137 (H) 70 - 99 mg/dL  Urine Drug Screen, Qualitative     Status: Abnormal   Collection Time: 05/14/22 10:20 PM  Result Value Ref Range   Tricyclic, Ur Screen NONE DETECTED NONE DETECTED   Amphetamines, Ur Screen NONE DETECTED NONE DETECTED   MDMA (Ecstasy)Ur Screen NONE DETECTED NONE DETECTED   Cocaine Metabolite,Ur [place] POSITIVE (A) NONE DETECTED   Opiate, Ur Screen POSITIVE (A) NONE DETECTED   Phencyclidine (PCP) Ur S NONE DETECTED NONE DETECTED   Cannabinoid 50 Ng, Ur [place] POSITIVE (A) NONE DETECTED   Barbiturates, Ur Screen NONE DETECTED NONE DETECTED   Benzodiazepine, Ur Scrn NONE DETECTED NONE DETECTED   Methadone Scn, Ur NONE DETECTED NONE DETECTED  POC CBG, ED     Status: Abnormal   Collection Time: 05/14/22 11:11 PM  Result Value Ref Range   Glucose-Capillary 109 (H) 70 - 99 mg/dL  POC CBG, ED     Status: None   Collection Time: 05/15/22 12:07 AM  Result Value Ref Range   Glucose-Capillary 81 70 - 99 mg/dL  POC CBG, ED     Status: None   Collection Time: 05/15/22  1:06 AM  Result Value Ref Range   Glucose-Capillary 82 70 - 99 mg/dL    Unresulted Labs (From admission, onward)     Start     Ordered    05/15/22 0500  Comprehensive metabolic panel  Tomorrow morning,   R        05/15/22 0117   05/15/22 0500  CBC  Tomorrow morning,   R        05/15/22 0117   05/15/22 0113  CBC  (heparin)  Once,   R       Comments: Baseline for heparin therapy IF NOT ALREADY DRAWN.  Notify MD if PLT < 100 K.    05/15/22 0117   05/15/22 0113  Creatinine, serum  (heparin)  Once,   R       Comments: Baseline for heparin therapy IF NOT ALREADY DRAWN.    05/15/22 0117   05/15/22 0023  Type and screen  Once,   STAT        05/15/22 0028   Unscheduled  Occult blood card to lab, stool  As needed,   URGENT      05/15/22 0028           Pt has received : Orders Placed This Encounter  Procedures   Critical Care    This order was created via procedure documentation    Standing Status:   Standing    Number of Occurrences:   1   CT Head Wo Contrast    Standing Status:   Standing    Number of Occurrences:   1  DG Knee 1-2 Views Left    Standing Status:   Standing    Number of Occurrences:   1    Order Specific Question:   Symptom/Reason for Exam    Answer:   Pain [id number]   DG Knee 1-2 Views Right    Standing Status:   Standing    Number of Occurrences:   1    Order Specific Question:   Symptom/Reason for Exam    Answer:   Pain [id number]   CT Head Wo Contrast    Standing Status:   Standing    Number of Occurrences:   1   DG Shoulder Left    Standing Status:   Standing    Number of Occurrences:   1    Order Specific Question:   Reason for Exam (SYMPTOM  OR DIAGNOSIS REQUIRED)    Answer:   fall   DG Humerus Left    Standing Status:   Standing    Number of Occurrences:   1    Order Specific Question:   Reason for Exam (SYMPTOM  OR DIAGNOSIS REQUIRED)    Answer:   fall   Comprehensive metabolic panel    Standing Status:   Standing    Number of Occurrences:   1   CBC with Differential    Standing Status:   Standing    Number of Occurrences:   1   Urine Drug Screen, Qualitative    Standing  Status:   Standing    Number of Occurrences:   1   Ethanol    Standing Status:   Standing    Number of Occurrences:   1   Magnesium    Standing Status:   Standing    Number of Occurrences:   1   Occult blood card to lab, stool    Standing Status:   Standing    Number of Occurrences:   3   CBC    Baseline for heparin therapy IF NOT ALREADY DRAWN.  Notify MD if PLT < 100 K.    Standing Status:   Standing    Number of Occurrences:   1   Creatinine, serum    Baseline for heparin therapy IF NOT ALREADY DRAWN.    Standing Status:   Standing    Number of Occurrences:   1   Comprehensive metabolic panel    Standing Status:   Standing    Number of Occurrences:   1   CBC    Standing Status:   Standing    Number of Occurrences:   1   Diet Carb Modified Fluid consistency: Thin; Room service appropriate? Yes    Standing Status:   Standing    Number of Occurrences:   1    Order Specific Question:   Diet-HS Snack?    Answer:   Nothing    Order Specific Question:   Calorie Level    Answer:   Medium 1600-2000    Order Specific Question:   Fluid consistency:    Answer:   Thin    Order Specific Question:   Room service appropriate?    Answer:   Yes   Bed rest with bathroom privileges    Standing Status:   Standing    Number of Occurrences:   1   Apply Angina, Rule Out Myocardial Infarction Care Plan    Standing Status:   Standing    Number of Occurrences:   1   Vital signs with O2 sat  q4 hours x 24 hours, then q shift    Standing Status:   Standing    Number of Occurrences:   1   Cardiac Monitoring Continuous x 24 hours Indications for use: Other; other indications for use: chest pain    Standing Status:   Standing    Number of Occurrences:   1    Order Specific Question:   Indications for use:    Answer:   Other    Order Specific Question:   other indications for use:    Answer:   chest pain   RN may order Cardiology PRN Orders utilizing "Cardiology PRN medications" (through manage  orders) for the following patient needs:    indigestion (Maalox), cough (Robitussin DM), constipation (MOM), diarrhea (Imodium), or minor skin irritation (Hydrocortisone Cream)    Standing Status:   Standing    Number of Occurrences:   [phone]   SCDs    Standing Status:   Standing    Number of Occurrences:   1    Order Specific Question:   Laterality    Answer:   Bilateral   Maintain IV access    Standing Status:   Standing    Number of Occurrences:   1   Vital signs    Standing Status:   Standing    Number of Occurrences:   1   Notify physician (specify)    Standing Status:   Standing    Number of Occurrences:   55    Order Specific Question:   Notify Physician    Answer:   for pulse less than 55 or greater than 120    Order Specific Question:   Notify Physician    Answer:   for respiratory rate less than 12 or greater than 25    Order Specific Question:   Notify Physician    Answer:   for temperature greater than 100.5 F    Order Specific Question:   Notify Physician    Answer:   for urinary output less than 30 mL/hr for four hours    Order Specific Question:   Notify Physician    Answer:   for systolic BP less than 90 or greater than 0000000, diastolic BP less than 60 or greater than 100    Order Specific Question:   Notify Physician    Answer:   for new hypoxia w/ oxygen saturations < 88%   Progressive Mobility Protocol: No Restrictions    Standing Status:   Standing    Number of Occurrences:   1   Daily weights    Standing Status:   Standing    Number of Occurrences:   1   Intake and Output    Standing Status:   Standing    Number of Occurrences:   1   Initiate Oral Care Protocol    Standing Status:   Standing    Number of Occurrences:   1   Initiate Carrier Fluid Protocol    Standing Status:   Standing    Number of Occurrences:   1   RN may order General Admission PRN Orders utilizing "General Admission PRN medications" (through manage orders) for the following patient  needs: allergy symptoms (Claritin), cold sores (Carmex), cough (Robitussin DM), eye irritation (Liquifilm Tears), hemorrhoids (Tucks), indigestion (Maalox), minor skin irritation (Hydrocortisone Cream), muscle pain ([place]), nose irritation (saline nasal spray) and sore throat (Chloraseptic spray).    Standing Status:   Standing    Number of Occurrences:  99999   Cardiac Monitoring - Continuous Indefinite    Standing Status:   Standing    Number of Occurrences:   1    Order Specific Question:   Indications for use:    Answer:   Acute heart failure/pulmonary edema   Apply shoulder immobilizer/sling    Standing Status:   Standing    Number of Occurrences:   1    Order Specific Question:   Reason for Shoulder immobilizer/Sling & Swathe    Answer:   Humerus fracture   Neuro checks    Standing Status:   Standing    Number of Occurrences:   1   Clinical institute withdrawal assessment    For the full pathway, please use the CIWA order sets. This is a one-time assessment order.    Standing Status:   Standing    Number of Occurrences:   1   Apply Diabetes Mellitus Care Plan    Standing Status:   Standing    Number of Occurrences:   1   STAT CBG when hypoglycemia is suspected. If treated, recheck every 15 minutes after each treatment until CBG >/= 70 mg/dl    Standing Status:   Standing    Number of Occurrences:   1   Refer to Hypoglycemia Protocol Sidebar Report for treatment of CBG < 70 mg/dl    Standing Status:   Standing    Number of Occurrences:   1   No HS correction Insulin    Standing Status:   Standing    Number of Occurrences:   1   No meal coverage at this time.    Standing Status:   Standing    Number of Occurrences:   1   Full code    Standing Status:   Standing    Number of Occurrences:   1    Order Specific Question:   By:    Answer:   Other   Consult to hospitalist    Standing Status:   Standing    Number of Occurrences:   1    Order Specific Question:   Place call  to:    Answer:   on call    Order Specific Question:   Reason for Consult    Answer:   Admit   Pulse oximetry check with vital signs    Standing Status:   Standing    Number of Occurrences:   1   Oxygen therapy Mode or (Route): Nasal cannula; Liters Per Minute: 2; Keep 02 saturation: greater than 92 %    Standing Status:   Standing    Number of Occurrences:   20    Order Specific Question:   Mode or (Route)    Answer:   Nasal cannula    Order Specific Question:   Liters Per Minute    Answer:   2    Order Specific Question:   Keep 02 saturation    Answer:   greater than 92 %   CBG monitoring, ED    Standing Status:   Standing    Number of Occurrences:   1   CBG monitoring, ED    Standing Status:   Standing    Number of Occurrences:   1   POC CBG, ED    Standing Status:   Standing    Number of Occurrences:   8   ED EKG    Standing Status:   Standing    Number of Occurrences:   1  Order Specific Question:   Reason for Exam    Answer:   Syncope   EKG 12-Lead (at 6am)    Standing Status:   Standing    Number of Occurrences:   1    Order Specific Question:   Notes    Answer:   chest pain   Type and screen    Standing Status:   Standing    Number of Occurrences:   1   Admit to Inpatient (patient's expected length of stay will be greater than 2 midnights or inpatient only procedure)    Standing Status:   Standing    Number of Occurrences:   1    Order Specific Question:   Hospital Area    Answer:   [place] [[id number]]    Order Specific Question:   Level of Care    Answer:   Stepdown [14]    Order Specific Question:   Covid Evaluation    Answer:   Asymptomatic - no recent exposure (last 10 days) testing not required    Order Specific Question:   Diagnosis    Answer:   Fall [id number]    Order Specific Question:   Admitting Physician    Answer:   Cherylann Ratel    Order Specific Question:   Attending Physician    Answer:   Cherylann Ratel     Order Specific Question:   Certification:    Answer:   I certify this patient will need inpatient services for at least 2 midnights    Order Specific Question:   Estimated Length of Stay    Answer:   2   Admit to Inpatient (patient's expected length of stay will be greater than 2 midnights or inpatient only procedure)    Standing Status:   Standing    Number of Occurrences:   1    Order Specific Question:   Hospital Area    Answer:   [place] [[id number]]    Order Specific Question:   Level of Care    Answer:   Stepdown [14]    Order Specific Question:   Covid Evaluation    Answer:   Asymptomatic - no recent exposure (last 10 days) testing not required    Order Specific Question:   Diagnosis    Answer:   Fall [[id number]]    Order Specific Question:   Admitting Physician    Answer:   Cherylann Ratel    Order Specific Question:   Attending Physician    Answer:   Cherylann Ratel    Order Specific Question:   Certification:    Answer:   I certify this patient will need inpatient services for at least 2 midnights   Seizure precautions    Standing Status:   Standing    Number of Occurrences:   1    Meds ordered this encounter  Medications   levETIRAcetam (KEPPRA) IVPB 500 mg/100 mL premix   levETIRAcetam (KEPPRA) 500 MG tablet    Sig: Take 1 tablet (500 mg total) by mouth 2 (two) times daily for 7 days.    Dispense:  14 tablet    Refill:  0   nicardipine (CARDENE) 13m in 0.86% saline 2073mIV infusion (0.1 mg/ml)   dextrose 10 % infusion   pantoprazole (PROTONIX) injection 40 mg   thiamine (VITAMIN B1) injection 100 mg   acetaminophen (TYLENOL) tablet 1,000 mg   levETIRAcetam (KEPPRA) IVPB 500  mg/100 mL premix   DISCONTD: acetaminophen (TYLENOL) tablet 1,000 mg   ascorbic acid (VITAMIN C) tablet 500 mg   DISCONTD: aspirin EC tablet 81 mg   DISCONTD: atorvastatin (LIPITOR) tablet 80 mg    Hold while taking Paxlovid     DISCONTD: clopidogrel  (PLAVIX) tablet 75 mg   ondansetron (ZOFRAN) injection 4 mg   DISCONTD: heparin injection 5,000 Units   sodium chloride flush (NS) 0.9 % injection 3 mL   OR Linked Order Group    acetaminophen (TYLENOL) tablet 650 mg    acetaminophen (TYLENOL) suppository 650 mg   HYDROcodone-acetaminophen (NORCO/VICODIN) 5-325 MG per tablet 1 tablet   morphine (PF) 2 MG/ML injection 2 mg   insulin aspart (novoLOG) injection 0-15 Units    Order Specific Question:   Correction coverage:    Answer:   Moderate (average weight, post-op)    Order Specific Question:   CBG < 70:    Answer:   implement hypoglycemia protocol    Order Specific Question:   CBG 70 - 120:    Answer:   0 units    Order Specific Question:   CBG 121 - 150:    Answer:   2 units    Order Specific Question:   CBG 151 - 200:    Answer:   3 units    Order Specific Question:   CBG 201 - 250:    Answer:   5 units    Order Specific Question:   CBG 251 - 300:    Answer:   8 units    Order Specific Question:   CBG 301 - 350:    Answer:   11 units    Order Specific Question:   CBG 351 - 400:    Answer:   15 units    Order Specific Question:   CBG > 400    Answer:   call MD and obtain STAT lab verification     Admission Imaging : DG Knee 1-2 Views Left  Result Date: 05/14/2022 CLINICAL DATA:  144615 Pain 144615 EXAM: RIGHT KNEE - 2 VIEW; LEFT KNEE - 2 VIEW COMPARISON:  None Available. FINDINGS: No evidence of fracture, dislocation, or joint effusion. There is bilateral tricompartmental joint space narrowing, sclerosis and osteophytes consistent with degenerative joint disease. Small suprapatellar effusions. IMPRESSION: Degenerative changes. Effusions.  No acute osseous abnormalities Electronically Signed   By: Sammie Bench M.D.   On: 05/14/2022 21:50   DG Knee 1-2 Views Right  Result Date: 05/14/2022 CLINICAL DATA:  144615 Pain 144615 EXAM: RIGHT KNEE - 2 VIEW; LEFT KNEE - 2 VIEW COMPARISON:  None Available. FINDINGS: No evidence of  fracture, dislocation, or joint effusion. There is bilateral tricompartmental joint space narrowing, sclerosis and osteophytes consistent with degenerative joint disease. Small suprapatellar effusions. IMPRESSION: Degenerative changes. Effusions.  No acute osseous abnormalities Electronically Signed   By: Sammie Bench M.D.   On: 05/14/2022 21:50   CT Head Wo Contrast  Addendum Date: 05/14/2022   ADDENDUM REPORT: 05/14/2022 21:40 ADDENDUM: These results were called by telephone at the time of interpretation on 05/14/2022 at 9:40 pm to provider [facility] , who verbally acknowledged these results. Electronically Signed   By: Fidela Salisbury M.D.   On: 05/14/2022 21:40   Result Date: 05/14/2022 CLINICAL DATA:  Head trauma, minor (Age >= 65y) Mental status change, unknown cause EXAM: CT HEAD WITHOUT CONTRAST TECHNIQUE: Contiguous axial images were obtained from the base of the skull through the vertex without intravenous  contrast. RADIATION DOSE REDUCTION: This exam was performed according to the departmental dose-optimization program which includes automated exposure control, adjustment of the mA and/or kV according to patient size and/or use of iterative reconstruction technique. COMPARISON:  02/17/2022 FINDINGS: Brain: Small right subdural hematoma noted along the right frontal convexity measuring 7 mm in thickness with mild mass effect upon the subjacent right frontal cortical gyri. No midline shift. Parenchymal volume loss is commensurate with the patient's age. Stable mild periventricular white matter changes are present likely reflecting the sequela of small vessel ischemia. No acute infarct. Ventricular size is normal. Cerebellum unremarkable. Vascular: No asymmetric hyperdense vasculature at the skull base. Skull: Intact Sinuses/Orbits: Paranasal sinuses are clear. Orbits are unremarkable. Other: Mastoid air cells and middle ear cavities are clear. IMPRESSION: 1. Small right frontal subdural hematoma  measuring 7 mm in thickness with mild mass effect upon the subjacent right frontal cortical gyri. No midline shift. 2. Stable mild senescent change. Electronically Signed: By: Fidela Salisbury M.D. On: 05/14/2022 21:34   Physical Examination: Vitals:   05/15/22 0015 05/15/22 0030 05/15/22 0100 05/15/22 0115  BP: 124/75 129/76 128/83 126/77  Pulse: 80 79 77 77  Temp:    98 F (36.7 C)  Resp: 14 13 14 10  $ Height:      Weight:      SpO2: 100% 100% 100% 100%  TempSrc:    Oral  BMI (Calculated):       Physical Exam Vitals and nursing note reviewed.  Constitutional:      General: He is not in acute distress.    Appearance: Normal appearance. He is not ill-appearing, toxic-appearing or diaphoretic.  HENT:     Head: Normocephalic and atraumatic.     Right Ear: Hearing and external ear normal.     Left Ear: Hearing and external ear normal.     Nose: Nose normal. No nasal deformity.     Mouth/Throat:     Lips: Pink.     Mouth: Mucous membranes are moist.     Tongue: No lesions.     Pharynx: Oropharynx is clear.  Eyes:     General: Lids are normal. Visual field deficit present. No allergic shiner or scleral icterus.       Right eye: No foreign body.        Left eye: No foreign body.     Extraocular Movements: Extraocular movements intact.     Right eye: Normal extraocular motion.     Left eye: Normal extraocular motion.     Pupils: Pupils are equal, round, and reactive to light.  Neck:     Vascular: No carotid bruit.  Cardiovascular:     Rate and Rhythm: Normal rate and regular rhythm.     Pulses: Normal pulses.     Heart sounds: Normal heart sounds.  Pulmonary:     Effort: Pulmonary effort is normal.     Breath sounds: Normal breath sounds.  Abdominal:     General: Bowel sounds are normal. There is no distension.     Palpations: Abdomen is soft. There is no mass.     Tenderness: There is no abdominal tenderness. There is no guarding.     Hernia: No hernia is present.   Musculoskeletal:     Right lower leg: No edema.     Left lower leg: No edema.  Skin:    General: Skin is warm.  Neurological:     General: No focal deficit present.     Mental Status:  He is alert and oriented to person, place, and time.     Motor: Motor function is intact.  Psychiatric:        Attention and Perception: Attention normal.        Mood and Affect: Mood normal.        Speech: Speech normal.        Behavior: Behavior normal. Behavior is cooperative.        Cognition and Memory: Cognition normal.      Assessment and Plan: * Falls PT/ STR ? Placement  Pt sustained head injury and ct shows SDH. ; IMPRESSION: 1. Small right frontal subdural hematoma measuring 7 mm in thickness with mild mass effect upon the subjacent right frontal cortical gyri. No midline shift. 2. Stable mild senescent change. Electronically Signed: By: Fidela Salisbury M.D. On: 05/14/2022 21:34    Acute renal failure superimposed on stage 3a chronic kidney disease ([place]) Admit to stepdown. Telemetry.  Bp check every 15 minutes.  Lab Results  Component Value Date   CREATININE 1.25 (H) 05/14/2022   CREATININE 1.04 04/29/2022   CREATININE 1.05 04/28/2022  Nothing else.      Cocaine abuse (HCC) High risk for IC bleed.  Counseling once stable.   Essential (primary) hypertension Pt started on nicardipine drip for hypertensive urgency.    History of substance abuse ([place]) Counseling for cocaine abuse and psych consult for his substance abuse and monitor. In stepdwon for any seizure activity. .   Shoulder pain, left Cont with prn pain meds and fall precaution. Attribute to fall.   Subdural hematoma (HCC) Continue keppra and NS will see pt in am. We will admit pt in stepdown.   Benign prostatic hyperplasia with nocturia No meds in chart. If pt develops any symptoms we will pursue his BPH eval.   Chronic hyponatremia Stable and mild. Cont with MIVF.    Anemia of chronic kidney  failure, stage 3 (moderate) (HCC) Attribute to ACD. Type/screen/ iv ppi.       Hypertensive urgency Pt coming with falls. Has Ct head shows in SDH. Goal will be lower bp slowly.  Attribute to fall and syncope 2./2 tog allf and alcohol abuse.      DVT prophylaxis:  Scd's   Code Status:  Full code   Family Communication:   Bruce,[name] (Friend) [phone]    Disposition Plan:  Home.  Consults called:  Neurosurgery: dr/ Izora Ribas.  Admission status: Observation.    Unit/ Expected LOS: Stepdown.   Para Skeans MD [facility]  6 PM- 2 AM. Please contact me via secure Chat 6 PM-2 AM. [phone]( Pager ) To contact the [facility] Attending or Consulting provider [place] or covering provider during after hours [place], for this patient.   Check the care team in [facility] and look for a) attending/consulting [facility] provider listed and b) the [facility] team listed Log into [email or web address] and use [place]'s universal password to access. If you do not have the password, please contact the hospital operator. Locate the [facility] provider you are looking for under [facility] and page to a number that you can be directly reached. If you still have difficulty reaching the provider, please page the [facility] (Director on Call) for the Hospitalists listed on amion for assistance. [email or web address] 05/15/2022, 1:44 AM

## 2022-05-15 NOTE — Assessment & Plan Note (Signed)
Pt coming with falls. Has Ct head shows in SDH. Goal will be lower bp slowly.  Attribute to fall and syncope 2./2 tog allf and alcohol abuse.

## 2022-05-15 NOTE — Assessment & Plan Note (Signed)
Cont with prn pain meds and fall precaution. Attribute to fall.

## 2022-05-15 NOTE — Assessment & Plan Note (Signed)
No meds in chart. If pt develops any symptoms we will pursue his BPH eval.

## 2022-05-15 NOTE — ED Notes (Signed)
Pt stated he needed to have a BM, stated "I don't think I can make it to the toilet, get me a bedside commode". Pt ambulated to bedside commode that was provided.

## 2022-05-15 NOTE — ED Notes (Signed)
Pt called out, this RN to bedside. Pt begins yelling, shouting and cussing at this RN stating that he is dissatisfied with his service, states he never got his food. I informed him that I was at his bedside when his food was delivered and helped him eat.  Pt continued shouting and cussing.  This RN informed him that I will let charge know.

## 2022-05-15 NOTE — ED Notes (Addendum)
MD notified of cbg 31.  Pt sitting upright drinking OJ and eating meal tray.

## 2022-05-15 NOTE — Assessment & Plan Note (Signed)
Pt started on nicardipine drip for hypertensive urgency.

## 2022-05-15 NOTE — ED Notes (Addendum)
Pt spilled tea on ground, told this RN "clean it up". Offered assistance with setting up meal tray, pt refused.

## 2022-05-15 NOTE — Plan of Care (Signed)
72 year old man from community with past medical history of diabetes mellitus type 1, hypertension, hyperglycemia presented to the ED with complaint of fall.  Patient was found to have subdural hematoma 7 mm.  Repeat CT with no extension.  Neurosurgery was consulted recommendation was to continue [place] for total of 7 days.  To follow-up with neurosurgery if no decompensation in 1 month.  Care continued as stated in HPI.

## 2022-05-15 NOTE — Assessment & Plan Note (Addendum)
Attribute to ACD. Type/screen/ iv ppi.

## 2022-05-15 NOTE — ED Notes (Signed)
Called to give report, was told that the pt has not been processed yet and I would receive a call back.

## 2022-05-15 NOTE — Assessment & Plan Note (Signed)
Counseling for cocaine abuse and psych consult for his substance abuse and monitor. In stepdwon for any seizure activity. Marland Kitchen

## 2022-05-15 NOTE — Assessment & Plan Note (Signed)
Stable and mild. Cont with MIVF.

## 2022-05-15 NOTE — Consult Note (Signed)
Consult requested by:  Dr. Posey Pronto  Consult requested for:  Subdural hematoma  Primary Physician:  Dion Body, MD  History of Present Illness: 05/15/2022 Nicholas Weiss is here today with a chief complaint of falls.  He has had multiple falls.  He reports that his left shoulder and right knee hurt from his fall.  He has history of multiple admissions to the hospital for diabetic ketoacidosis as well as difficulty with his blood pressure.  He was found to be in hypertensive urgency in the emergency department requiring medical intervention.  He denies headache now.  He has no change in his neurologic status.  He has no other complaints.   I have utilized the care everywhere function in epic to review the outside records available from external health systems.  Review of Systems:  A 10 point review of systems is negative, except for the pertinent positives and negatives detailed in the HPI.  Past Medical History: Past Medical History:  Diagnosis Date   Acute metabolic encephalopathy [id number]   DKA (diabetic ketoacidoses) 04/06/2016   Hypercholesteremia    Hypertension     Past Surgical History: Past Surgical History:  Procedure Laterality Date   AMPUTATION TOE Left 04/25/2022   Procedure: LEFT GREAT TOE AMPUTATION AND LEFT PARTIAL 2ND TOE AMPUTATION;  Surgeon: Edrick Kins, DPM;  Location: [facility] ORS;  Service: Podiatry;  Laterality: Left;   COLONOSCOPY WITH PROPOFOL N/A 02/01/2020   Procedure: COLONOSCOPY WITH PROPOFOL;  Surgeon: Lin Landsman, MD;  Location: [facility] ENDOSCOPY;  Service: Gastroenterology;  Laterality: N/A;   ESOPHAGOGASTRODUODENOSCOPY  02/01/2020   Procedure: ESOPHAGOGASTRODUODENOSCOPY (EGD);  Surgeon: Lin Landsman, MD;  Location: [facility] ENDOSCOPY;  Service: Gastroenterology;;   KNEE SURGERY Right    Torn meniscus   KNEE SURGERY Left     Allergies: Allergies as of 05/14/2022 - Review Complete 05/14/2022  Allergen Reaction Noted    Penicillins Other (See Comments) 10/16/2014    Medications: Current Meds  Medication Sig   levETIRAcetam (KEPPRA) 500 MG tablet Take 1 tablet (500 mg total) by mouth 2 (two) times daily for 7 days.    Social History: Social History   Tobacco Use   Smoking status: Never   Smokeless tobacco: Never  Vaping Use   Vaping Use: Never used  Substance Use Topics   Alcohol use: Not Currently    Alcohol/week: 0.0 - 1.0 standard drinks of alcohol    Comment: "once every 2 months"   Drug use: Yes    Types: Marijuana, "Crack" cocaine    Comment: last week    Family Medical History: Family History  Problem Relation Age of Onset   Heart attack Father    Hypertension Sister    Cancer Sister     Physical Examination: Vitals:   05/15/22 0930 05/15/22 0945  BP: (!) 149/92 (!) 145/92  Pulse: 66 67  Resp:    Temp:    SpO2: 100% 100%    General: Patient is well developed, well nourished, calm, collected, and in no apparent distress. Attention to examination is appropriate.  Neck:   Supple.  Full range of motion.  Respiratory: Patient is breathing without any difficulty.   NEUROLOGICAL:     Awake, alert, oriented to person, place, and time.  Speech is clear and fluent.  Cranial Nerves: Pupils equal round and reactive to light.  Facial tone is symmetric.  Facial sensation is symmetric. Shoulder shrug is symmetric. Tongue protrusion is midline.  There is no pronator drift.  ROM of spine: full.    Strength: 5/5 BUE with some limitation on L shoulder flexion due to pain in shoulder from fall. 5/5 LLE, RLE shows pain in R knee limiting strength examination.  He has 5/5 in DF/PF/EHL   Reflexes are 1+ and symmetric at the biceps, triceps, brachioradialis, patella and achilles.   Hoffman's is absent.   Bilateral upper and lower extremity sensation is intact to light touch.    No evidence of dysmetria noted.  Gait is untested.     Medical Decision Making  Imaging: CT Head  05/15/2022 IMPRESSION: Unchanged small subdural hematoma along the right frontal convexity, measuring up to 7 mm. Unchanged mild mass effect on the right frontal lobe, without significant midline shift.     Electronically Signed   By: Merilyn Baba M.D.   On: 05/15/2022 03:13  I have personally reviewed the images and agree with the above interpretation.  Assessment and Plan: [name] is a pleasant 72 y.o. male with small traumatic subdural hematoma on the right side.  He is stable clinically.  His imaging findings are stable.  There is no need for surgical intervention.  I recommended that he take Keppra 500 mg twice daily for 1 week.  Will follow him up with a CT scan in 1 month.  If he has any change in neurologic condition, I would recommend obtaining an additional CT scan and contacting the neurosurgical provider on-call.    I have communicated my recommendations to the requesting physician and coordinated care to facilitate these recommendations.     Airlie Blumenberg K. Izora Ribas MD, [facility] Neurosurgery

## 2022-05-15 NOTE — Assessment & Plan Note (Signed)
Admit to stepdown. Telemetry.  Bp check every 15 minutes.  Lab Results  Component Value Date   CREATININE 1.25 (H) 05/14/2022   CREATININE 1.04 04/29/2022   CREATININE 1.05 04/28/2022  Nothing else.

## 2022-05-15 NOTE — ED Notes (Signed)
Called Report to [employer]

## 2022-05-15 NOTE — ED Notes (Signed)
CBG 94 

## 2022-05-15 NOTE — Assessment & Plan Note (Signed)
High risk for IC bleed.  Counseling once stable.

## 2022-05-15 NOTE — Progress Notes (Signed)
Pt received from ED via stretcher. Alert and oriented, pleasant. Complains of "12/10" pain in left arm and right knee due to fall. Given 1 Norco po with relief. IV LR infusing at 50 cc per hour into right AC, and D10 infusing at 75 cc per hour in left AC. Both sites positional, flushed without difficulty. Pt encouraged to keep arm straight if possible. Pt was placed on telemetry monitor, BP monitor, and given call bell, urinal, phone and remote. Warm blankets given. Bed alarm on. Left foot ace wrap dressing intact, with small amount brownish drainage noted. Pt states he had toes removed recently. Remainder of skin intact. Cardene drip initiated at 5 mg per hour, to right forearm with good effect to BP.

## 2022-05-16 DIAGNOSIS — E162 Hypoglycemia, unspecified: Secondary | ICD-10-CM

## 2022-05-16 DIAGNOSIS — S42392A Other fracture of shaft of left humerus, initial encounter for closed fracture: Secondary | ICD-10-CM

## 2022-05-16 LAB — BASIC METABOLIC PANEL
Anion gap: 8 (ref 5–15)
BUN: 27 mg/dL — ABNORMAL HIGH (ref 8–23)
CO2: 23 mmol/L (ref 22–32)
Calcium: 8.6 mg/dL — ABNORMAL LOW (ref 8.9–10.3)
Chloride: 100 mmol/L (ref 98–111)
Creatinine, Ser: 1.32 mg/dL — ABNORMAL HIGH (ref 0.61–1.24)
GFR, Estimated: 58 mL/min — ABNORMAL LOW (ref >60)
Glucose, Bld: 384 mg/dL — ABNORMAL HIGH (ref 70–99)
Potassium: 5.6 mmol/L — ABNORMAL HIGH (ref 3.5–5.1)
Sodium: 131 mmol/L — ABNORMAL LOW (ref 135–145)

## 2022-05-16 LAB — GLUCOSE, CAPILLARY
Glucose-Capillary: 428 mg/dL — ABNORMAL HIGH (ref 70–99)
Glucose-Capillary: 374 mg/dL — ABNORMAL HIGH (ref 70–99)
Glucose-Capillary: 91 mg/dL (ref 70–99)
Glucose-Capillary: 20 mg/dL — CL (ref 70–99)
Glucose-Capillary: 123 mg/dL — ABNORMAL HIGH (ref 70–99)
Glucose-Capillary: 35 mg/dL — CL (ref 70–99)
Glucose-Capillary: 148 mg/dL — ABNORMAL HIGH (ref 70–99)
Glucose-Capillary: 111 mg/dL — ABNORMAL HIGH (ref 70–99)
Glucose-Capillary: 499 mg/dL — ABNORMAL HIGH (ref 70–99)

## 2022-05-16 MED ORDER — INSULIN ASPART 100 UNIT/ML IJ SOLN
0.0000 [IU] | INTRAMUSCULAR | Status: DC
  Administered 2022-05-16 – 2022-05-17 (×2): 9 [IU] via SUBCUTANEOUS
  Administered 2022-05-17: 4 [IU] via SUBCUTANEOUS
  Administered 2022-05-17: 5 [IU] via SUBCUTANEOUS
  Filled 2022-05-16 (×5): qty 1

## 2022-05-16 MED ORDER — DEXTROSE 50 % IV SOLN
INTRAVENOUS | Status: AC
  Filled 2022-05-16: qty 50

## 2022-05-16 MED ORDER — DEXTROSE 50 % IV SOLN
25.0000 g | INTRAVENOUS | Status: AC
  Administered 2022-05-16: 25 g via INTRAVENOUS

## 2022-05-16 MED ORDER — SODIUM CHLORIDE 0.9 % IV SOLN: INTRAVENOUS | Status: DC | PRN

## 2022-05-16 MED ORDER — DEXTROSE-NACL 10-0.45 % IV SOLN
INTRAVENOUS | Status: DC
  Filled 2022-05-16 (×2): qty 1000

## 2022-05-16 MED ORDER — SODIUM ZIRCONIUM CYCLOSILICATE 5 G PO PACK
10.0000 g | PACK | Freq: Once | ORAL | Status: AC
  Administered 2022-05-16: 10 g via ORAL
  Filled 2022-05-16: qty 2

## 2022-05-16 MED ORDER — INSULIN DETEMIR 100 UNIT/ML ~~LOC~~ SOLN
5.0000 [IU] | Freq: Every day | SUBCUTANEOUS | Status: DC
  Administered 2022-05-17 – 2022-05-19 (×3): 5 [IU] via SUBCUTANEOUS
  Filled 2022-05-16 (×3): qty 0.05

## 2022-05-16 MED ORDER — DEXTROSE 50 % IV SOLN
INTRAVENOUS | Status: AC
  Administered 2022-05-16: 50 mL
  Filled 2022-05-16: qty 50

## 2022-05-16 MED ORDER — DEXTROSE 50 % IV SOLN: 25.0000 g | INTRAVENOUS | Status: AC

## 2022-05-16 MED ORDER — INSULIN DETEMIR 100 UNIT/ML ~~LOC~~ SOLN
7.0000 [IU] | Freq: Every day | SUBCUTANEOUS | Status: DC
  Filled 2022-05-16: qty 0.07

## 2022-05-16 NOTE — Progress Notes (Signed)
Pt observed to have increased somnolence; CBG 36. D50 given Dr. Dwyane Dee notified. D10 1/2 NS infusing

## 2022-05-16 NOTE — Progress Notes (Signed)
Pt noted to be somnolent and diaphoretic lying in bed. CBG 20; given 25 gm D50 per protocol. F/U CBG 123, and pt more awake.

## 2022-05-16 NOTE — Progress Notes (Signed)
Progress Note   Patient: Nicholas Weiss [id number] DOB: 12/22/1950 DOA: 05/14/2022     1 DOS: the patient was seen and examined on 05/16/2022   Brief hospital course:  SAFAREE BERTE is an 72 y.o. man from community with past medical history of diabetes melitis type I, hypoglycemia, hypertension, DKA and history of multiple falls presented to the ED with complaint of fall.  Patient had multiple admissions for DKA in the past.  Was found to be in hypertensive urgency on presentation.  Patient was evaluated with CT scan of the head which showed subdural hematoma 7 mm in size.  Neurosurgery was consulted Dr Cari Caraway, patient was started on keppra for prophylaxis bid 500 mg bid x 7 days.  At the time of my evaluation patient was back to his baseline, confused.  No complaint of headache, blurring of vision, chest pain, palpitation, shortness of breath or abdominal pain.  No focal weakness.  No loss of consciousness no bowel bladder incontinence.  Patient was admitted to stepdown unit for maintenance of blood pressure in the setting of subdural hematoma.  Patient was kept on nicardipine drip.  Which was discontinued on 2/17 as blood pressure was in stable range.  Patient however developed hyperkalemia with potassium of 5.6 and hyponatremia 131.  Started patient on IV normal saline for worsening renal function with creatinine of 1.32.  2/17 : Patient seen and examined this morning.  No overnight events.  Back to baseline.  Had an episode of hypoglycemia with blood glucose of 20.  Repleted with improvement in mental status.  Continued with D10 half-normal saline for now.  Had hyperkalemia with potassium of 5.6 ordered Lokelma.  Insulin dose decreased to 6 units nightly with corrective scale with meals.  Assessment and Plan: * Falls PT/ STR ? Placement  Pt sustained head injury and ct shows SDH. ;IMPRESSION: 1. Small right frontal subdural hematoma measuring 7 mm in thickness with mild mass effect  upon the subjacent right frontal cortical gyri. No midline shift. 2. Stable mild senescent change. Electronically Signed:   Hyperkalemia : Mild hyperkalemia with potassium of 5.6.  Ordered Lokelma  Acute renal failure superimposed on stage 3a chronic kidney disease ([place]) Admit to stepdown. Telemetry.  Bp check every 15 minutes.  Lab Results  Component Value Date   CREATININE 1.25 (H) 05/14/2022   CREATININE 1.04 04/29/2022   CREATININE 1.05 04/28/2022  Nothing else.   Cocaine abuse  High risk for IC bleed.  Counseling once stable.  Essential (primary) hypertension Pt started on nicardipine drip for hypertensive urgency.  Which was discontinued on 2/17.  History of substance abuse  Counseling for cocaine abuse and psych consult for his substance abuse and monitor. In stepdwon for any seizure activity.  Shoulder pain, left Cont with prn pain meds and fall precaution. Attribute to fall.   Subdural hematoma  Continue keppra and NS will see pt in am. We will admit pt in stepdown.  Benign prostatic hyperplasia with nocturia No meds in chart. If pt develops any symptoms we will pursue his BPH eval.  Chronic hyponatremia Stable and mild. Cont with MIVF.   Anemia of chronic kidney failure, stage 3 (moderate)  Attribute to ACD. Type/screen/ iv ppi.   Hypertensive urgency Pt coming with falls. Has Ct head shows in SDH. Goal will be lower bp slowly.  Attribute to fall and syncope 2./2 tog allf and alcohol abuse.      Subjective: Patient seen and examined this morning.  No overnight  events.  Patient concerned about dressing changes of his left foot wound.  No complaint of headache or focal weakness.  Vital labs and imaging reviewed.  Vitals have been stable with blood pressure under 140/ 90 overnight.  Off of nicardipine drip.  Patient planned to be transferred to [place] floor.  Physical Exam: Vitals:   05/16/22 0400 05/16/22 0430 05/16/22 0500 05/16/22 0800  BP: 126/72  128/79 125/76 100/69  Pulse: 77 72 73   Resp: 14 (!) 23 10 18  $ Temp:      TempSrc:      SpO2: 100% 100% 100%   Weight:      Height:      Physical Exam HENT:     Head: Normocephalic and atraumatic.     Nose: Nose normal.  Eyes:     Pupils: Pupils are equal, round, and reactive to light.  Cardiovascular:     Rate and Rhythm: Normal rate and regular rhythm.  Pulmonary:     Effort: Pulmonary effort is normal.  Genitourinary:    Comments: No foley  Musculoskeletal:        General: Normal range of motion.     Cervical back: Normal range of motion.  Skin:    General: Skin is warm.  Neurological:     Mental Status: He is alert.  Psychiatric:        Mood and Affect: Mood normal.      Data Reviewed:  There are no new results to review at this time.  Family Communication:   Disposition: Status is: Inpatient Remains inpatient appropriate because: For SDH  Planned Discharge Destination:  TBD    Time spent: 30 minutes  Author: Oran Rein, MD 05/16/2022 8:19 AM  For on call review [email or web address].

## 2022-05-17 LAB — GLUCOSE, CAPILLARY
Glucose-Capillary: 116 mg/dL — ABNORMAL HIGH (ref 70–99)
Glucose-Capillary: 172 mg/dL — ABNORMAL HIGH (ref 70–99)
Glucose-Capillary: 209 mg/dL — ABNORMAL HIGH (ref 70–99)
Glucose-Capillary: 23 mg/dL — CL (ref 70–99)
Glucose-Capillary: 295 mg/dL — ABNORMAL HIGH (ref 70–99)
Glucose-Capillary: 301 mg/dL — ABNORMAL HIGH (ref 70–99)
Glucose-Capillary: 309 mg/dL — ABNORMAL HIGH (ref 70–99)
Glucose-Capillary: 327 mg/dL — ABNORMAL HIGH (ref 70–99)
Glucose-Capillary: 351 mg/dL — ABNORMAL HIGH (ref 70–99)
Glucose-Capillary: 406 mg/dL — ABNORMAL HIGH (ref 70–99)
Glucose-Capillary: 457 mg/dL — ABNORMAL HIGH (ref 70–99)

## 2022-05-17 LAB — BASIC METABOLIC PANEL
Anion gap: 7 (ref 5–15)
BUN: 39 mg/dL — ABNORMAL HIGH (ref 8–23)
CO2: 23 mmol/L (ref 22–32)
Calcium: 8.5 mg/dL — ABNORMAL LOW (ref 8.9–10.3)
Chloride: 103 mmol/L (ref 98–111)
Creatinine, Ser: 1.83 mg/dL — ABNORMAL HIGH (ref 0.61–1.24)
GFR, Estimated: 39 mL/min — ABNORMAL LOW (ref >60)
Glucose, Bld: 51 mg/dL — ABNORMAL LOW (ref 70–99)
Potassium: 3.9 mmol/L (ref 3.5–5.1)
Sodium: 133 mmol/L — ABNORMAL LOW (ref 135–145)

## 2022-05-17 LAB — CBC
HCT: 23.4 % — ABNORMAL LOW (ref 39.0–52.0)
Hemoglobin: 7.6 g/dL — ABNORMAL LOW (ref 13.0–17.0)
MCH: 29.8 pg (ref 26.0–34.0)
MCHC: 32.5 g/dL (ref 30.0–36.0)
MCV: 91.8 fL (ref 80.0–100.0)
Platelets: 406 10*3/uL — ABNORMAL HIGH (ref 150–400)
RBC: 2.55 MIL/uL — ABNORMAL LOW (ref 4.22–5.81)
RDW: 16.7 % — ABNORMAL HIGH (ref 11.5–15.5)
WBC: 3.7 10*3/uL — ABNORMAL LOW (ref 4.0–10.5)
nRBC: 0 % (ref 0.0–0.2)

## 2022-05-17 MED ORDER — DEXTROSE 50 % IV SOLN: 25.0000 g | INTRAVENOUS | Status: AC

## 2022-05-17 MED ORDER — DEXTROSE 50 % IV SOLN
INTRAVENOUS | Status: AC
  Administered 2022-05-17: 50 mL
  Filled 2022-05-17: qty 50

## 2022-05-17 MED ORDER — DEXTROSE-NACL 10-0.45 % IV SOLN
INTRAVENOUS | Status: AC
  Filled 2022-05-17: qty 1000

## 2022-05-17 MED ORDER — SODIUM CHLORIDE 0.9 % IV SOLN: INTRAVENOUS | Status: DC

## 2022-05-17 MED ORDER — INSULIN ASPART 100 UNIT/ML IJ SOLN: 4.0000 [IU] | Freq: Once | INTRAMUSCULAR | Status: AC

## 2022-05-17 NOTE — Progress Notes (Signed)
. Progress Note   Patient: Nicholas Weiss [id number] DOB: 06-Nov-1950 DOA: 05/14/2022     2 DOS: the patient was seen and examined on 05/17/2022   Brief hospital course:  DELAINE SAUBER is an 72 y.o. man from [place] with past medical history of diabetes melitis type I, hypoglycemia, hypertension, DKA and history of multiple falls presented to the ED with complaint of fall.  Patient had multiple admissions for DKA in the past.  Was found to be in hypertensive urgency on presentation.  Patient was evaluated with CT scan of the head which showed subdural hematoma 7 mm in size.  Neurosurgery was consulted Dr Cari Caraway, patient was started on keppra for prophylaxis bid 500 mg bid x 7 days.  At the time of my evaluation patient was back to his baseline, confused.  No complaint of headache, blurring of vision, chest pain, palpitation, shortness of breath or abdominal pain.  No focal weakness.  No loss of consciousness no bowel bladder incontinence.  Patient was admitted to stepdown unit for maintenance of blood pressure in the setting of subdural hematoma.  Patient was kept on nicardipine drip.  Which was discontinued on 2/17 as blood pressure was in stable range.  Patient however developed hyperkalemia with potassium of 5.6 and hyponatremia 131.  Started patient on IV normal saline for worsening renal function with creatinine of 1.32.  2/17 : Patient seen and examined this morning.  No overnight events.  Back to baseline.  Had an episode of hypoglycemia with blood glucose of 20.  Repleted with improvement in mental status.  Continued with D10 half-normal saline for now.  Had hyperkalemia with potassium of 5.6 ordered Lokelma.  Insulin dose decreased to 6 units nightly with corrective scale with meals.  2/18 : Patient seen and examined this morning no overnight events.  Had early morning hypoglycemia managed with hypoglycemia protocol.  Patient morning glucose in the range of 120s to 150s.  Patient did  not receive his Lantus last night.  Reports that he takes [name] 15 units in the morning daily with no issues.  However patient has been having multiple falls likely secondary to hypoglycemia will need adjustment of insulin and endocrinology follow-up in the outpatient setting.  Patient presenting symptoms have resolved however needs optimization of insulin prior to discharge.  Assessment and Plan: * Falls PT/ STR ? Placement  Pt sustained head injury and ct shows SDH. ;IMPRESSION: 1. Small right frontal subdural hematoma measuring 7 mm in thickness with mild mass effect upon the subjacent right frontal cortical gyri. No midline shift. 2. Stable mild senescent change. Electronically Signed:   Hyperkalemia : Mild hyperkalemia with potassium of 5.6.  Ordered Lokelma  Diabetes mellitus type 1 : Patient is on oral hypoglycemic agent along with [name].  On admission patient was placed on corrective scale insulin along with Lantus 7 units.  Patient's sugars have been labile.  Has early morning elevated blood glucose unclear if it is dawn phenomenon versus Smogy effect.  Will check glucose every 2 hours to optimize insulin regimen.  Acute renal failure superimposed on stage 3a chronic kidney disease ([place]) Admit to stepdown. Telemetry.  Bp check every 15 minutes.  Lab Results  Component Value Date   CREATININE 1.25 (H) 05/14/2022   CREATININE 1.04 04/29/2022   CREATININE 1.05 04/28/2022  Nothing else.   Cocaine abuse  High risk for IC bleed.  Counseling once stable.  Essential (primary) hypertension Pt started on nicardipine drip for hypertensive urgency.  Which  was discontinued on 2/17.  History of substance abuse  Counseling for cocaine abuse and psych consult for his substance abuse and monitor. In stepdwon for any seizure activity.  Shoulder pain, left Cont with prn pain meds and fall precaution. Attribute to fall.   Subdural hematoma  Continue keppra and NS will see pt in am. We  will admit pt in stepdown.  Benign prostatic hyperplasia with nocturia No meds in chart. If pt develops any symptoms we will pursue his BPH eval.  Chronic hyponatremia Stable and mild. Cont with MIVF.   Anemia of chronic kidney failure, stage 3 (moderate)  Attribute to ACD. Type/screen/ iv ppi.   Hypertensive urgency Pt coming with falls. Has Ct head shows in SDH. Goal will be lower bp slowly.  Attribute to fall and syncope 2./2 tog allf and alcohol abuse.      Subjective: Patient seen and examined.  No overnight event.  Vital labs and imaging reviewed.  Physical Exam: Vitals:   05/17/22 0745 05/17/22 0800 05/17/22 0830 05/17/22 0833  BP: (!) 83/60 101/64  119/74  Pulse: 74 76 67 69  Resp: 19 16 16 $ (!) 8  Temp:   97.6 F (36.4 C)   TempSrc:   Oral   SpO2: 100% 100% 100% 100%  Weight:      Height:      Physical Exam HENT:     Head: Normocephalic and atraumatic.     Nose: Nose normal.  Eyes:     Pupils: Pupils are equal, round, and reactive to light.  Cardiovascular:     Rate and Rhythm: Normal rate and regular rhythm.  Pulmonary:     Effort: Pulmonary effort is normal.  Genitourinary:    Comments: No foley  Musculoskeletal:        General: Normal range of motion.     Cervical back: Normal range of motion.  Skin:    General: Skin is warm.  Neurological:     Mental Status: He is alert.  Psychiatric:        Mood and Affect: Mood normal.      Data Reviewed:  There are no new results to review at this time.  Family Communication:   Disposition: Status is: Inpatient Remains inpatient appropriate because: For SDH  Planned Discharge Destination:  TBD    Time spent: 30 minutes  Author: Oran Rein, MD 05/17/2022 12:20 PM  For on call review [email or web address].

## 2022-05-18 ENCOUNTER — Inpatient Hospital Stay: Payer: 59 | Admitting: Oncology

## 2022-05-18 ENCOUNTER — Ambulatory Visit: Payer: 59 | Admitting: Oncology

## 2022-05-18 ENCOUNTER — Other Ambulatory Visit: Payer: 59

## 2022-05-18 ENCOUNTER — Inpatient Hospital Stay: Payer: 59

## 2022-05-18 ENCOUNTER — Ambulatory Visit: Payer: 59

## 2022-05-18 DIAGNOSIS — W19XXXD Unspecified fall, subsequent encounter: Secondary | ICD-10-CM

## 2022-05-18 DIAGNOSIS — I10 Essential (primary) hypertension: Secondary | ICD-10-CM | POA: Diagnosis not present

## 2022-05-18 LAB — GLUCOSE, CAPILLARY
Glucose-Capillary: 112 mg/dL — ABNORMAL HIGH (ref 70–99)
Glucose-Capillary: 155 mg/dL — ABNORMAL HIGH (ref 70–99)
Glucose-Capillary: 190 mg/dL — ABNORMAL HIGH (ref 70–99)
Glucose-Capillary: 202 mg/dL — ABNORMAL HIGH (ref 70–99)
Glucose-Capillary: 222 mg/dL — ABNORMAL HIGH (ref 70–99)
Glucose-Capillary: 224 mg/dL — ABNORMAL HIGH (ref 70–99)
Glucose-Capillary: 270 mg/dL — ABNORMAL HIGH (ref 70–99)
Glucose-Capillary: 318 mg/dL — ABNORMAL HIGH (ref 70–99)

## 2022-05-18 LAB — CK: Total CK: 36 U/L — ABNORMAL LOW (ref 49–397)

## 2022-05-18 MED ORDER — PANTOPRAZOLE SODIUM 40 MG PO TBEC
40.0000 mg | DELAYED_RELEASE_TABLET | Freq: Two times a day (BID) | ORAL | Status: DC
  Administered 2022-05-18 – 2022-05-28 (×20): 40 mg via ORAL
  Filled 2022-05-18 (×21): qty 1

## 2022-05-18 MED ORDER — INSULIN ASPART 100 UNIT/ML IJ SOLN
0.0000 [IU] | Freq: Three times a day (TID) | INTRAMUSCULAR | Status: DC
  Administered 2022-05-18: 2 [IU] via SUBCUTANEOUS
  Administered 2022-05-18: 3 [IU] via SUBCUTANEOUS
  Administered 2022-05-19: 1 [IU] via SUBCUTANEOUS
  Administered 2022-05-19: 7 [IU] via SUBCUTANEOUS
  Administered 2022-05-20: 9 [IU] via SUBCUTANEOUS
  Filled 2022-05-18 (×5): qty 1

## 2022-05-18 MED ORDER — LEVETIRACETAM 500 MG PO TABS
500.0000 mg | ORAL_TABLET | Freq: Two times a day (BID) | ORAL | Status: AC
  Administered 2022-05-18 – 2022-05-22 (×9): 500 mg via ORAL
  Filled 2022-05-18 (×9): qty 1

## 2022-05-18 MED ORDER — SODIUM CHLORIDE 0.9 % IV SOLN: INTRAVENOUS | Status: AC

## 2022-05-18 MED ORDER — THIAMINE MONONITRATE 100 MG PO TABS
100.0000 mg | ORAL_TABLET | Freq: Every day | ORAL | Status: DC
  Administered 2022-05-19 – 2022-05-28 (×10): 100 mg via ORAL
  Filled 2022-05-18 (×10): qty 1

## 2022-05-18 NOTE — Progress Notes (Signed)
Patient with blood sugars in the 200's tonight. He has refused to take short acting insulin as ordered stating that it drops his sugars into the 20's and he's afraid he won't wake back up. The patient was told by this RN that we are monitoring his blood sugar levels more often. He was still not agreeable to taking the insulin. Neomia Glass NP made aware.

## 2022-05-18 NOTE — Inpatient Diabetes Management (Signed)
Inpatient Diabetes Program Recommendations  AACE/ADA: New Consensus Statement on Inpatient Glycemic Control  Target Ranges:  Prepandial:   less than 140 mg/dL      Peak postprandial:   less than 180 mg/dL (1-2 hours)      Critically ill patients:  140 - 180 mg/dL    Latest Reference Range & Units 05/18/22 00:29 05/18/22 02:12 05/18/22 04:13 05/18/22 05:53 05/18/22 07:36  Glucose-Capillary 70 - 99 mg/dL 222 (H) 318 (H) 270 (H) 224 (H) 202 (H)    Latest Reference Range & Units 05/17/22 08:26 05/17/22 10:33 05/17/22 12:57 05/17/22 14:59 05/17/22 17:03 05/17/22 19:19 05/17/22 21:24 05/17/22 23:15  Glucose-Capillary 70 - 99 mg/dL 116 (H) 172 (H) 295 (H) 351 (H) 309 (H) 327 (H) 209 (H) 301 (H)   Review of Glycemic Control  Diabetes history: DM1 Outpatient Diabetes medications: Tresiba 20 units daily, Novolog 0-9 units TID, Farxiga 10 mg daily Current orders for Inpatient glycemic control: Levemir 5 units daily, Novolog 0-9 units TID with meals  Inpatient Diabetes Program Recommendations:    Insulin: Please consider increasing Levemir to 7 units daily, adding Novolog 0-5 units QHS, and ordering Novolog 2 units TID with meals for meal coverage if patient eats at least 50% of meals.   Thanks, Barnie Alderman, RN, MSN, [place] Diabetes Coordinator Inpatient Diabetes Program [phone] (Team Pager from 8am to [place])

## 2022-05-18 NOTE — Progress Notes (Signed)
Progress Note   Patient: Nicholas Weiss [id number] DOB: [birth year] DOA: 05/14/2022     3 DOS: the patient was seen and examined on 05/18/2022   Brief hospital course:  Nicholas Weiss is an 72 y.o. man from community with past medical history of diabetes melitis type I, hypoglycemia, hypertension, DKA and history of multiple falls presented to the ED with complaint of fall.  Patient had multiple admissions for DKA in the past.  Was found to be in hypertensive urgency on presentation.  Patient was evaluated with CT scan of the head which showed subdural hematoma 7 mm in size.  Neurosurgery was consulted Dr Cari Caraway, patient was started on keppra for prophylaxis bid 500 mg bid x 7 days.  At the time of my evaluation patient was back to his baseline, confused.  No complaint of headache, blurring of vision, chest pain, palpitation, shortness of breath or abdominal pain.  No focal weakness.  No loss of consciousness no bowel bladder incontinence.   Patient was admitted to stepdown unit for maintenance of blood pressure in the setting of subdural hematoma.  Patient was kept on nicardipine drip.  Which was discontinued on 2/17 as blood pressure was in stable range.  Patient however developed hyperkalemia with potassium of 5.6 and hyponatremia 131.  Started patient on IV normal saline for worsening renal function with creatinine of 1.32.   2/17 : Patient seen and examined this morning.  No overnight events.  Back to baseline.  Had an episode of hypoglycemia with blood glucose of 20.  Repleted with improvement in mental status.  Continued with D10 half-normal saline for now.  Had hyperkalemia with potassium of 5.6 ordered Lokelma.  Insulin dose decreased to 6 units nightly with corrective scale with meals.   2/18 : Patient seen and examined this morning no overnight events.  Had early morning hypoglycemia managed with hypoglycemia protocol.  Patient morning glucose in the range of 120s to 150s.  Patient  did not receive his Lantus last night.  Reports that he takes [name] 15 units in the morning daily with no issues.  However patient has been having multiple falls likely secondary to hypoglycemia will need adjustment of insulin and endocrinology follow-up in the outpatient setting.  Patient presenting symptoms have resolved however needs optimization of insulin prior to discharge.  2/19: Patient is seen and examined at the bedside.  He is awake and alert and is oriented to person and place but not to time.     Assessment and Plan: * Falls PT/ STR ? Placement  Pt sustained head injury and ct shows SDH. ;IMPRESSION: 1. Small right frontal subdural hematoma measuring 7 mm in thickness with mild mass effect upon the subjacent right frontal cortical gyri. No midline shift. 2. Stable mild senescent change. Electronically Signed:  Will request PT evaluation   Hyperkalemia : Mild hyperkalemia with potassium of 5.6.  Improved   Diabetes mellitus type 1 : Patient is on oral hypoglycemic agent along with [name].  On admission patient was placed on corrective scale insulin along with Lantus 7 units.  Patient's sugars have been labile.  Has early morning elevated blood glucose unclear if it is dawn phenomenon versus Smogy effect.   Continue Levemir 7 units daily with sliding scale coverage    Acute renal failure superimposed on stage 3a chronic kidney disease ([place]) Continue IVF hydration Repeat renal parameters in am Obtain CK levels      Lab Results  Component Value Date  CREATININE 1.25 (H) 05/14/2022    CREATININE 1.04 04/29/2022    CREATININE 1.05 04/28/2022  Nothing else.    Cocaine abuse  High risk for IC bleed.  Counseling once stable.   Essential (primary) hypertension Pt started on nicardipine drip for hypertensive urgency.  Has been off since 02/17   History of substance abuse  Counseling for cocaine abuse   Shoulder pain, left Cont with prn pain meds and fall precaution.  Attribute to fall.    Subdural hematoma  Continue keppra per neurosurgery recommendation   Benign prostatic hyperplasia with nocturia No meds in chart. If pt develops any symptoms we will pursue his BPH eval.   Chronic hyponatremia Stable and mild. Cont with MIVF.    Anemia of chronic kidney failure, stage 3 (moderate)  Attribute to ACD. Type/screen/ IV PPI   Hypertensive urgency Pt coming with falls. Has Ct head shows in SDH. Goal will be lower bp slowly.  Attribute to fall and syncope 2./2 tog allf and alcohol abuse.              Physical Exam: Vitals:   05/18/22 1200 05/18/22 1300 05/18/22 1400 05/18/22 1500  BP: 136/83  125/80   Pulse: 73 73 73 76  Resp:      Temp: 98.1 F (36.7 C)     TempSrc:      SpO2: 99% 100% 100% 100%  Weight:      Height:       Physical Exam Vitals and nursing note reviewed.  Constitutional:      Comments: Oriented to person  HENT:     Head: Normocephalic.     Nose: Nose normal.     Mouth/Throat:     Mouth: Mucous membranes are moist.  Eyes:     Comments: Pale conjunctiva  Cardiovascular:     Rate and Rhythm: Normal rate and regular rhythm.  Pulmonary:     Effort: Pulmonary effort is normal.     Breath sounds: Normal breath sounds.  Abdominal:     General: Abdomen is flat. Bowel sounds are normal.     Palpations: Abdomen is soft.  Musculoskeletal:        General: Normal range of motion.     Cervical back: Normal range of motion and neck supple.  Skin:    General: Skin is warm and dry.  Neurological:     Mental Status: He is alert.     Comments: Able to move all extremities  Psychiatric:        Mood and Affect: Mood normal.        Behavior: Behavior normal.     Data Reviewed:  There are no new results to review at this time.  Family Communication: Discussed treatment plans with patient and will transfer to Medical telemetry  Disposition: Status is: Inpatient Remains inpatient appropriate because: Blood  sugars are labile  Planned Discharge Destination: Disposition pending PT evalution    Time spent: 35  minutes  Author: Collier Bullock, MD 05/18/2022 4:13 PM  For on call review [email or web address].

## 2022-05-18 NOTE — Progress Notes (Addendum)
1600 Dressing change to left foot. Wet to dry with area painted with betadine.Patient tolerated it well. 1608 Report called to Joe RN on 1C.  1615 moved to room 111 via bed.Marland Kitchen

## 2022-05-18 NOTE — Progress Notes (Signed)
PHARMACIST - PHYSICIAN COMMUNICATION  CONCERNING: IV to Oral Route Change Policy  RECOMMENDATION: This patient is receiving pantoprazole, thiamine by the intravenous route.  Based on criteria approved by the Pharmacy and Therapeutics Committee, the intravenous medication(s) is/are being converted to the equivalent oral dose form(s).  DESCRIPTION: These criteria include: The patient is eating (either orally or via tube) and/or has been taking other orally administered medications for a least 24 hours The patient has no evidence of active gastrointestinal bleeding or impaired GI absorption (gastrectomy, short bowel, patient on TNA or NPO).  If you have questions about this conversion, please contact the [place], [facility] 05/18/2022 1:15 PM

## 2022-05-19 ENCOUNTER — Ambulatory Visit: Payer: 59 | Admitting: Infectious Diseases

## 2022-05-19 ENCOUNTER — Encounter: Payer: 59 | Admitting: Podiatry

## 2022-05-19 DIAGNOSIS — I10 Essential (primary) hypertension: Secondary | ICD-10-CM | POA: Diagnosis not present

## 2022-05-19 DIAGNOSIS — W19XXXD Unspecified fall, subsequent encounter: Secondary | ICD-10-CM | POA: Diagnosis not present

## 2022-05-19 LAB — GLUCOSE, CAPILLARY
Glucose-Capillary: 202 mg/dL — ABNORMAL HIGH (ref 70–99)
Glucose-Capillary: 247 mg/dL — ABNORMAL HIGH (ref 70–99)
Glucose-Capillary: 303 mg/dL — ABNORMAL HIGH (ref 70–99)
Glucose-Capillary: 178 mg/dL — ABNORMAL HIGH (ref 70–99)
Glucose-Capillary: 134 mg/dL — ABNORMAL HIGH (ref 70–99)
Glucose-Capillary: 164 mg/dL — ABNORMAL HIGH (ref 70–99)
Glucose-Capillary: 32 mg/dL — CL (ref 70–99)
Glucose-Capillary: 273 mg/dL — ABNORMAL HIGH (ref 70–99)

## 2022-05-19 LAB — BASIC METABOLIC PANEL
Anion gap: 5 (ref 5–15)
BUN: 30 mg/dL — ABNORMAL HIGH (ref 8–23)
CO2: 21 mmol/L — ABNORMAL LOW (ref 22–32)
Calcium: 8.1 mg/dL — ABNORMAL LOW (ref 8.9–10.3)
Chloride: 108 mmol/L (ref 98–111)
Creatinine, Ser: 1.15 mg/dL (ref 0.61–1.24)
GFR, Estimated: >60 mL/min (ref >60)
Glucose, Bld: 223 mg/dL — ABNORMAL HIGH (ref 70–99)
Potassium: 3.7 mmol/L (ref 3.5–5.1)
Sodium: 134 mmol/L — ABNORMAL LOW (ref 135–145)

## 2022-05-19 LAB — IRON AND TIBC
Iron: 48 ug/dL (ref 45–182)
Saturation Ratios: 21 % (ref 17.9–39.5)
TIBC: 224 ug/dL — ABNORMAL LOW (ref 250–450)
UIBC: 176 ug/dL

## 2022-05-19 LAB — CORTISOL-AM, BLOOD: Cortisol - AM: 10.3 ug/dL (ref 6.7–22.6)

## 2022-05-19 LAB — MAGNESIUM: Magnesium: 1.9 mg/dL (ref 1.7–2.4)

## 2022-05-19 MED ORDER — MIDODRINE HCL 5 MG PO TABS
5.0000 mg | ORAL_TABLET | Freq: Two times a day (BID) | ORAL | Status: DC
  Administered 2022-05-19 – 2022-05-20 (×2): 5 mg via ORAL
  Filled 2022-05-19 (×2): qty 1

## 2022-05-19 MED ORDER — MIDODRINE HCL 5 MG PO TABS: 5.0000 mg | ORAL_TABLET | Freq: Three times a day (TID) | ORAL | Status: DC

## 2022-05-19 MED ORDER — INSULIN DETEMIR 100 UNIT/ML ~~LOC~~ SOLN
7.0000 [IU] | Freq: Every day | SUBCUTANEOUS | Status: DC
  Administered 2022-05-20 – 2022-05-21 (×2): 7 [IU] via SUBCUTANEOUS
  Filled 2022-05-19 (×2): qty 0.07

## 2022-05-19 MED ORDER — DEXTROSE 50 % IV SOLN
INTRAVENOUS | Status: AC
  Administered 2022-05-19: 25 g via INTRAVENOUS
  Filled 2022-05-19: qty 50

## 2022-05-19 MED ORDER — DEXTROSE 50 % IV SOLN: 25.0000 g | INTRAVENOUS | Status: AC

## 2022-05-19 NOTE — TOC Progression Note (Signed)
Transition of Care [facility]) - Progression Note    Patient Details  Name: Nicholas Weiss MRN: [id number] Date of Birth: 1950-05-27  Transition of Care [facility]) CM/SW Contact  Gerilyn Pilgrim, LCSW Phone Number: 05/19/2022, 3:27 PM  Clinical Narrative: Unable to reach patients emergency contact Ivin Booty and unable to leave voicemail. CSW will follow up again later in admission.         Expected Discharge Plan and Services                                               Social Determinants of Health (SDOH) Interventions SDOH Screenings   Food Insecurity: No Food Insecurity (05/15/2022)  Housing: Low Risk  (05/15/2022)  Transportation Needs: No Transportation Needs (05/15/2022)  Utilities: Not At Risk (05/15/2022)  Alcohol Screen: Low Risk  (10/02/2020)  Depression (PHQ2-9): Medium Risk (10/02/2020)  Financial Resource Strain: Low Risk  (01/01/2020)  Physical Activity: Insufficiently Active (01/01/2020)  Social Connections: Moderately Isolated (01/01/2020)  Stress: No Stress Concern Present (01/01/2020)  Tobacco Use: Low Risk  (05/15/2022)    Readmission Risk Interventions    03/12/2022   10:22 AM 02/25/2022    2:43 PM 12/08/2021   10:33 AM  Readmission Risk Prevention Plan  Transportation Screening Complete Complete Complete  Medication Review Press photographer) Complete Complete Complete  PCP or Specialist appointment within 3-5 days of discharge Complete Complete Complete  HRI or Home Care Consult Complete Complete Complete  SW Recovery Care/Counseling Consult Complete  Complete  Palliative Care Screening Not Applicable Not Applicable Not March ARB Not Applicable Not Applicable Not Applicable

## 2022-05-19 NOTE — Inpatient Diabetes Management (Signed)
Inpatient Diabetes Program Recommendations  AACE/ADA: New Consensus Statement on Inpatient Glycemic Control  Target Ranges:  Prepandial:   less than 140 mg/dL      Peak postprandial:   less than 180 mg/dL (1-2 hours)      Critically ill patients:  140 - 180 mg/dL     Review of Glycemic Control  Latest Reference Range & Units 05/18/22 07:36 05/18/22 11:12 05/18/22 17:37 05/18/22 21:44 05/19/22 01:35 05/19/22 05:02 05/19/22 07:31  Glucose-Capillary 70 - 99 mg/dL 202 (H) 155 (H) 112 (H) 190 (H) 202 (H) 247 (H) 303 (H)   Diabetes history: DM1 Outpatient Diabetes medications: Tresiba 20 units daily, Novolog 0-9 units TID, Farxiga 10 mg daily Current orders for Inpatient glycemic control: Levemir 5 units daily, Novolog 0-9 units TID with meals  Inpatient Diabetes Program Recommendations:    -   increasing Levemir to 7 units daily  -   adding Novolog 0-5 units QHS  -   order Novolog 2 units TID with meals for meal coverage if patient eats at least 50% of meals.   Thanks, Tama Headings RN, MSN, BC-ADM Inpatient Diabetes Coordinator Team Pager [phone] (8a-5p)

## 2022-05-19 NOTE — Progress Notes (Addendum)
Progress Note   Patient: Nicholas Weiss [id number] DOB: [birth year] DOA: 05/14/2022     4 DOS: the patient was seen and examined on 05/19/2022   Brief hospital course:  Nicholas Weiss is an 72 y.o. man [place] with past medical history of diabetes melitis type I, hypoglycemia, hypertension, DKA and history of multiple falls who presented to the ED with complaints of fall.  Patient had multiple admissions for DKA in the past.  Was found to be in hypertensive urgency on presentation.  Patient was evaluated with CT scan of the head which showed subdural hematoma 7 mm in size.  Neurosurgery was consulted Dr Cari Caraway, patient was started on keppra for prophylaxis bid 500 mg bid x 7 days.  At the time of my evaluation patient was back to his baseline, confused.  No complaint of headache, blurring of vision, chest pain, palpitation, shortness of breath or abdominal pain.  No focal weakness.  No loss of consciousness no bowel bladder incontinence.   Patient was admitted to stepdown unit for maintenance of blood pressure in the setting of subdural hematoma.  Patient was kept on nicardipine drip.  Which was discontinued on 2/17 as blood pressure was in stable range.  Patient however developed hyperkalemia with potassium of 5.6 and hyponatremia 131.  Started patient on IV normal saline for worsening renal function with creatinine of 1.32.   2/17 : Patient seen and examined this morning.  No overnight events.  Back to baseline.  Had an episode of hypoglycemia with blood glucose of 20.  Repleted with improvement in mental status.  Continued with D10 half-normal saline for now.  Had hyperkalemia with potassium of 5.6 ordered Lokelma.  Insulin dose decreased to 6 units nightly with corrective scale with meals.   2/18 : Patient seen and examined this morning no overnight events.  Had early morning hypoglycemia managed with hypoglycemia protocol.  Patient morning glucose in the range of 120s to 150s.   Patient did not receive his Lantus last night.  Reports that he takes [name] 15 units in the morning daily with no issues.  However patient has been having multiple falls likely secondary to hypoglycemia will need adjustment of insulin and endocrinology follow-up in the outpatient setting.  Patient presenting symptoms have resolved however needs optimization of insulin prior to discharge.   2/19: Patient is seen and examined at the bedside.  He is awake and alert and is oriented to person and place but not to time.  2/20 : Patient is seen and examined at bedside.  Eating breakfast and appears comfortable and in no distress.       Assessment and Plan: Falls Pt sustained head injury and ct showed SDH. ;IMPRESSION: 1. Small right frontal subdural hematoma measuring 7 mm in thickness with mild mass effect upon the subjacent right frontal cortical gyri. No midline shift. 2. Stable mild  Evaluated by PT and noted to have orthostatic blood pressure changes which could have contributed to his frequent falls Will start patient on midodrine Obtain cortisol level   Hyperkalemia : Mild hyperkalemia with potassium of 5.6.   Improved   Diabetes mellitus type 1 : Patient is on oral hypoglycemic agent along with [name].   On admission patient was placed on corrective scale insulin along with Lantus 7 units.         Acute renal failure superimposed on stage 3a chronic kidney disease ([place]) Renal function has improved and is back to baseline CK levels are within normal  limits    Cocaine abuse  High risk for IC bleed.  Counseling once stable. Patient has been counseled on the need to abstain from further cocaine use   Hypertensive urgency Pt started on nicardipine drip for hypertensive urgency.  Has been off since 02/17 Noted to have orthostatic blood pressure changes and started on midodrine   Shoulder pain, left Cont with prn pain meds and fall precaution. Attribute to fall.    Subdural  hematoma  Continue keppra per neurosurgery recommendation to complete a 7-day course of therapy     Chronic hyponatremia Stable and mild. Cont with MIVF.    Anemia of chronic kidney failure, stage 3 (moderate)  Attribute history of multiple myeloma (no ER evaluation). Transfuse as needed     Osteomyelitis Status post recent amputation of left great toe and left partial second toe amputation Continue Augmentin to complete the course               Physical Exam: Vitals:   05/18/22 2145 05/19/22 0054 05/19/22 0500 05/19/22 0723  BP: (!) 172/95 (!) 167/95 (!) 129/90 (!) 131/98  Pulse: 75 78 76 83  Resp: 18 19 18 18  $ Temp: 97.8 F (36.6 C) 97.7 F (36.5 C) 98.1 F (36.7 C) 97.9 F (36.6 C)  TempSrc: Oral Oral Oral   SpO2: 100% 100% 100% 100%  Weight:      Height:       Physical Exam Vitals and nursing note reviewed.  Constitutional:      Comments: Chronically ill-appearing  HENT:     Head: Normocephalic and atraumatic.     Nose: Nose normal.     Mouth/Throat:     Mouth: Mucous membranes are moist.  Eyes:     Comments: Pale conjunctiva  Cardiovascular:     Rate and Rhythm: Normal rate and regular rhythm.  Pulmonary:     Effort: Pulmonary effort is normal.     Breath sounds: Normal breath sounds.  Abdominal:     General: Abdomen is flat. Bowel sounds are normal.     Palpations: Abdomen is soft.  Musculoskeletal:        General: Normal range of motion.     Cervical back: Normal range of motion and neck supple.     Comments: Dressing over left foot  Skin:    General: Skin is warm and dry.  Neurological:     Motor: Weakness present.  Psychiatric:        Mood and Affect: Mood normal.        Behavior: Behavior normal.     Data Reviewed: Labs reviewed.  Sodium 134, potassium 3.7, chloride 108, bicarb 21, glucose 223, BUN 30, creatinine 1.15, calcium 8.1 There are no new results to review at this time.  Family Communication: Discussed treatment plan  with patient at the bedside.  He verbalizes understanding and agrees with plan.  Disposition: Status is: Inpatient Remains inpatient appropriate because: Optimize glycemic and blood pressure control  Planned Discharge Destination: Home with Home Health    Time spent: 35 minutes  Author: Collier Bullock, MD 05/19/2022 11:48 AM  For on call review [email or web address].

## 2022-05-19 NOTE — Progress Notes (Signed)
Blood sugar is 32. Patient is mildy confused but alert. Gave amp D50.MD awared.

## 2022-05-19 NOTE — Evaluation (Signed)
Physical Therapy Evaluation Patient Details Name: ARVILLE HEXT MRN: [id number] DOB: 1950/09/15 Today's Date: 05/19/2022  History of Present Illness  presented to ER secondary to recurrent falls (related to hypoglycemia?); admitted for management of SDH (67m, stable) and acute fracture to L humerus/greater tubercle.  Clinical Impression  Patient sleeping in bed upon arrival to room; awakens to voice, light touch.  Oriented to self, location and situation; follows commands and agreeable to participation with session as tolerated.  Endorses pain to L shoulder (FACES 6/10); requiring consistent cuing for immobilization and NWB with functional tasks.  Bilat LE strength and ROM grossly symmetrical and WFL for basic transfers and mobility tasks; L foot with ace-wrap, dressing intact (clean and dry).  Currently completes bed mobility with close sup; maintains static and dynamic sitting balance with close sup.  Does endorse "feeling off" and "feeling funny" with transition to upright; noted with symptomatic orthostasis.  RN/MD informed/aware; additional mobility/OOB efforts deferred as result. Would benefit from skilled PT to address above deficits and promote optimal return to PLOF.; Recommend transition to HHPT upon discharge from acute hospitalization.   Vitals assessment: Supine: BP 99/68  Sitting: BP 64/52, HR 82  Return to supine: BP 79/57, HR 81  End of session, supine: BP 84/53, HR 75        Recommendations for follow up therapy are one component of a multi-disciplinary discharge planning process, led by the attending physician.  Recommendations may be updated based on patient status, additional functional criteria and insurance authorization.  Follow Up Recommendations Home health PT (anticipate HHPT once BP stabilized; will continue to monitor/update as needed)      Assistance Recommended at Discharge    Patient can return home with the following  A little help with walking and/or  transfers;A little help with bathing/dressing/bathroom    Equipment Recommendations Rolling walker (2 wheels)  Recommendations for Other Services       Functional Status Assessment Patient has had a recent decline in their functional status and demonstrates the ability to make significant improvements in function in a reasonable and predictable amount of time.     Precautions / Restrictions Precautions Precautions: Fall Restrictions Other Position/Activity Restrictions: Per previous notes, WBAT with CAM boot to L LE      Mobility  Bed Mobility Overal bed mobility: Needs Assistance Bed Mobility: Supine to Sit, Sit to Supine     Supine to sit: Supervision Sit to supine: Supervision   General bed mobility comments: symptomatic orthostasis with transition to upright    Transfers                   General transfer comment: unsafe/unable    Ambulation/Gait               General Gait Details: unsafe/unable  Stairs            Wheelchair Mobility    Modified Rankin (Stroke Patients Only)       Balance Overall balance assessment: Needs assistance Sitting-balance support: No upper extremity supported, Feet supported Sitting balance-Leahy Scale: Good                                       Pertinent Vitals/Pain Pain Assessment Pain Assessment: Faces Faces Pain Scale: Hurts even more Pain Location: L shoulder Pain Descriptors / Indicators: Aching, Guarding, Grimacing Pain Intervention(s): Limited activity within patient's tolerance, Monitored during session, Repositioned  Home Living Family/patient expects to be discharged to:: Private residence Living Arrangements: Spouse/significant other Available Help at Discharge: Family;Available 24 hours/day Type of Home: House Home Access: Stairs to enter Entrance Stairs-Rails: Can reach both Entrance Stairs-Number of Steps: 1-2 STE bil railing   Home Layout: One level Home  Equipment: Cane - single point;Tub bench;Grab bars - toilet;Grab bars - tub/shower;Rolling Walker (2 wheels)      Prior Function Prior Level of Function : Independent/Modified Independent             Mobility Comments: Independent; 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  Does endorse recent use of SPC since L foot surgery ADLs Comments: reports he is typically able to do all he needs w/o issue     Hand Dominance   Dominant Hand: Right    Extremity/Trunk Assessment   Upper Extremity Assessment Upper Extremity Assessment: Generalized weakness (R UE grossly WFL; L UE grossly 3-/5, constant cuing for immobilization and NWB L UE)    Lower Extremity Assessment Lower Extremity Assessment: Generalized weakness (grossly 3-/5 throughout, L foot ace wrapped (recent 1-2 toe amp))       Communication   Communication: No difficulties  Cognition Arousal/Alertness: Lethargic Behavior During Therapy: Flat affect Overall Cognitive Status: Within Functional Limits for tasks assessed                                 General Comments: "just feel off"        General Comments      Exercises Other Exercises Other Exercises: Unsupported sitting, mod assist to don underwear and pants; generally lethargic with task.  Endorses "feeling off" with sustained sititng position. Other Exercises: Orthostatic assessment-supine BP 99-68; sitting BP 64/52, HR 82; return to supine BP 79/57, HR 81; end of session, supine BP 84/53, HR 75. RN/CNA/MD informed and aware.  Additional mobility/OOB deferred as result.   Assessment/Plan    PT Assessment Patient needs continued PT services  PT Problem List Decreased activity tolerance;Decreased balance;Decreased mobility       PT Treatment Interventions DME instruction;Gait training;Stair training;Functional mobility training;Therapeutic activities;Therapeutic exercise;Balance training;Neuromuscular  re-education;Cognitive remediation    PT Goals (Current goals can be found in the Care Plan section)  Acute Rehab PT Goals Patient Stated Goal: to go home PT Goal Formulation: With patient Time For Goal Achievement: 06/02/22 Potential to Achieve Goals: Good    Frequency Min 2X/week     Co-evaluation               AM-PAC PT "6 Clicks" Mobility  Outcome Measure Help needed turning from your back to your side while in a flat bed without using bedrails?: None Help needed moving from lying on your back to sitting on the side of a flat bed without using bedrails?: A Little Help needed moving to and from a bed to a chair (including a wheelchair)?: A Little Help needed standing up from a chair using your arms (e.g., wheelchair or bedside chair)?: A Little Help needed to walk in hospital room?: Total Help needed climbing 3-5 steps with a railing? : Total 6 Click Score: 15    End of Session   Activity Tolerance: Patient tolerated treatment well Patient left: in bed;with call bell/phone within reach;with bed alarm set Nurse Communication: Mobility status PT Visit Diagnosis: Other abnormalities of gait and mobility (R26.89);Unsteadiness on feet (R26.81)    Time:  [id number] PT Time Calculation (min) (ACUTE ONLY): 30 min   Charges:   PT Evaluation $PT Eval Moderate Complexity: 1 Mod PT Treatments $Therapeutic Activity: 8-22 mins       Shraga Custard H. Owens Shark, PT, DPT, NCS 05/19/22, 11:55 AM [phone]

## 2022-05-20 DIAGNOSIS — W19XXXD Unspecified fall, subsequent encounter: Secondary | ICD-10-CM | POA: Diagnosis not present

## 2022-05-20 DIAGNOSIS — I10 Essential (primary) hypertension: Secondary | ICD-10-CM | POA: Diagnosis not present

## 2022-05-20 LAB — CBC
HCT: 26.9 % — ABNORMAL LOW (ref 39.0–52.0)
Hemoglobin: 8.4 g/dL — ABNORMAL LOW (ref 13.0–17.0)
MCH: 28.9 pg (ref 26.0–34.0)
MCHC: 31.2 g/dL (ref 30.0–36.0)
MCV: 92.4 fL (ref 80.0–100.0)
Platelets: 357 10*3/uL (ref 150–400)
RBC: 2.91 MIL/uL — ABNORMAL LOW (ref 4.22–5.81)
RDW: 16.8 % — ABNORMAL HIGH (ref 11.5–15.5)
WBC: 4 10*3/uL (ref 4.0–10.5)
nRBC: 0 % (ref 0.0–0.2)

## 2022-05-20 LAB — GLUCOSE, CAPILLARY
Glucose-Capillary: 218 mg/dL — ABNORMAL HIGH (ref 70–99)
Glucose-Capillary: 228 mg/dL — ABNORMAL HIGH (ref 70–99)
Glucose-Capillary: 233 mg/dL — ABNORMAL HIGH (ref 70–99)
Glucose-Capillary: 250 mg/dL — ABNORMAL HIGH (ref 70–99)
Glucose-Capillary: 281 mg/dL — ABNORMAL HIGH (ref 70–99)
Glucose-Capillary: 286 mg/dL — ABNORMAL HIGH (ref 70–99)
Glucose-Capillary: 401 mg/dL — ABNORMAL HIGH (ref 70–99)

## 2022-05-20 LAB — HEMOGLOBIN A1C
Hgb A1c MFr Bld: 9.1 % — ABNORMAL HIGH (ref 4.8–5.6)
Mean Plasma Glucose: 214.47 mg/dL

## 2022-05-20 MED ORDER — INSULIN ASPART 100 UNIT/ML IJ SOLN
0.0000 [IU] | Freq: Three times a day (TID) | INTRAMUSCULAR | Status: DC
  Administered 2022-05-20: 2 [IU] via SUBCUTANEOUS
  Administered 2022-05-21: 4 [IU] via SUBCUTANEOUS
  Administered 2022-05-21 – 2022-05-22 (×3): 3 [IU] via SUBCUTANEOUS
  Administered 2022-05-23: 4 [IU] via SUBCUTANEOUS
  Administered 2022-05-23 – 2022-05-24 (×2): 3 [IU] via SUBCUTANEOUS
  Administered 2022-05-24 (×2): 1 [IU] via SUBCUTANEOUS
  Administered 2022-05-25: 2 [IU] via SUBCUTANEOUS
  Administered 2022-05-25: 3 [IU] via SUBCUTANEOUS
  Administered 2022-05-26: 1 [IU] via SUBCUTANEOUS
  Administered 2022-05-26: 6 [IU] via SUBCUTANEOUS
  Administered 2022-05-26: 4 [IU] via SUBCUTANEOUS
  Administered 2022-05-27: 2 [IU] via SUBCUTANEOUS
  Administered 2022-05-27: 3 [IU] via SUBCUTANEOUS
  Administered 2022-05-27: 1 [IU] via SUBCUTANEOUS
  Administered 2022-05-28: 5 [IU] via SUBCUTANEOUS
  Administered 2022-05-28: 6 [IU] via SUBCUTANEOUS
  Filled 2022-05-20 (×20): qty 1

## 2022-05-20 MED ORDER — MIDODRINE HCL 5 MG PO TABS
5.0000 mg | ORAL_TABLET | Freq: Three times a day (TID) | ORAL | Status: DC
  Administered 2022-05-20 – 2022-05-22 (×5): 5 mg via ORAL
  Filled 2022-05-20 (×5): qty 1

## 2022-05-20 NOTE — Progress Notes (Signed)
PROGRESS NOTE    Nicholas Weiss  [id number] DOB: [birth year] DOA: 05/14/2022 PCP: Dion Body, MD    Brief Narrative:  72 y.o. man from community with past medical history of diabetes melitis type I, hypoglycemia, hypertension, DKA and history of multiple falls who presented to the ED with complaints of fall.  Patient had multiple admissions for DKA in the past.  Was found to be in hypertensive urgency on presentation.  Patient was evaluated with CT scan of the head which showed subdural hematoma 7 mm in size.  Neurosurgery was consulted Dr Cari Caraway, patient was started on keppra for prophylaxis bid 500 mg bid x 7 days.  At the time of my evaluation patient was back to his baseline, confused.  No complaint of headache, blurring of vision, chest pain, palpitation, shortness of breath or abdominal pain.  No focal weakness.  No loss of consciousness no bowel bladder incontinence.   Patient was admitted to stepdown unit for maintenance of blood pressure in the setting of subdural hematoma.  Patient was kept on nicardipine drip.  Which was discontinued on 2/17 as blood pressure was in stable range.  Patient however developed hyperkalemia with potassium of 5.6 and hyponatremia 131.  Started patient on IV normal saline for worsening renal function with creatinine of 1.32.   2/17 : Patient seen and examined this morning.  No overnight events.  Back to baseline.  Had an episode of hypoglycemia with blood glucose of 20.  Repleted with improvement in mental status.  Continued with D10 half-normal saline for now.  Had hyperkalemia with potassium of 5.6 ordered Lokelma.  Insulin dose decreased to 6 units nightly with corrective scale with meals.   2/18 : Patient seen and examined this morning no overnight events.  Had early morning hypoglycemia managed with hypoglycemia protocol.  Patient morning glucose in the range of 120s to 150s.  Patient did not receive his Lantus last night.  Reports that he  takes [name] 15 units in the morning daily with no issues.  However patient has been having multiple falls likely secondary to hypoglycemia will need adjustment of insulin and endocrinology follow-up in the outpatient setting.  Patient presenting symptoms have resolved however needs optimization of insulin prior to discharge.   2/19: Patient is seen and examined at the bedside.  He is awake and alert and is oriented to person and place but not to time.   2/20 : Patient is seen and examined at bedside.  Eating breakfast and appears comfortable and in no distress.  2/21: Remains orthostatic and symptomatic  Assessment & Plan:   Principal Problem:   Falls Active Problems:   Acute renal failure superimposed on stage 3a chronic kidney disease (HCC)   Essential (primary) hypertension   Cocaine abuse ([place])   History of substance abuse ([place])   Hypertensive urgency   Anemia of chronic kidney failure, stage 3 (moderate) (HCC)   Chronic hyponatremia   Benign prostatic hyperplasia with nocturia   Subdural hematoma (HCC)   Shoulder pain, left  Falls Orthostatic hypotension Pt sustained head injury and ct showed SDH. ;IMPRESSION: 1. Small right frontal subdural hematoma measuring 7 mm in thickness with mild mass effect upon the subjacent right frontal cortical gyri. No midline shift. 2. Stable mild  Evaluated by PT and noted to have orthostatic blood pressure changes which could have contributed to his frequent falls Plan: Increase midodrine to 5 mg 3 times daily   Hyperkalemia  Improved   Diabetes mellitus type 1  Patient is on oral hypoglycemic agent along with [name].   On admission patient was placed on corrective scale insulin along with Lantus 7 units.    Has had issues with hypoglycemia and labile sugars Plan: Continue Semglee 7 units daily Decrease sliding scale to very sensitive No nightly coverage       Acute renal failure superimposed on stage 3a chronic kidney disease  (HCC) Renal function has improved and is back to baseline CK levels are within normal limits     Cocaine abuse  High risk for IC bleed.  Patient has been counseled on the need to abstain from further cocaine use   Hypertensive urgency Pt started on nicardipine drip for hypertensive urgency.  Has been off since 02/17 Noted to have orthostatic blood pressure changes and started on midodrine BP reasonably well-controlled   Shoulder pain, left Cont with prn pain meds and fall precaution. Attribute to fall.    Subdural hematoma  Continue keppra per neurosurgery recommendation to complete a 7-day course of therapy     Chronic hyponatremia Stable and mild.   Anemia of chronic kidney failure, stage 3 (moderate)  Attribute history of multiple myeloma (no ER evaluation). Transfuse as needed       Osteomyelitis Status post recent amputation of left great toe and left partial second toe amputation Continue Augmentin to complete the course    DVT prophylaxis: SCD Code Status: Full Family Communication: None Disposition Plan: Status is: Inpatient Remains inpatient appropriate because: Symptomatic orthostatic hypotension   Level of care: Telemetry Medical  Consultants:  None  Procedures:  None  Antimicrobials: None   Subjective: Seen and examined.  Resting comfortably in bed.  Poor historian.  No specific complaints  Objective: Vitals:   05/19/22 2048 05/20/22 0021 05/20/22 0533 05/20/22 0857  BP: (!) 167/97 (!) 160/112 132/89 131/87  Pulse: 76 92 83 89  Resp: 14 17 16 16  $ Temp: 97.7 F (36.5 C) 98.8 F (37.1 C) 98.6 F (37 C) 98.7 F (37.1 C)  TempSrc:    Oral  SpO2: 100% 100% 100% 100%  Weight:      Height:        Intake/Output Summary (Last 24 hours) at 05/20/2022 1150 Last data filed at 05/19/2022 1900 Gross per 24 hour  Intake 240 ml  Output --  Net 240 ml   Filed Weights   05/14/22 2027 05/17/22 0500  Weight: 68.9 kg 64.4 kg     Examination:  General exam: No acute distress Respiratory system: Clear to auscultation. Respiratory effort normal. Cardiovascular system: S1-S2, RRR, no murmurs, no pedal edema Gastrointestinal system: Soft, NT/ND, normal bowel sounds Central nervous system: Alert and oriented. No focal neurological deficits. Extremities: Bilateral lower extremity weakness Skin: No rashes, lesions or ulcers Psychiatry: Judgement and insight appear normal. Mood & affect flattened.     Data Reviewed: I have personally reviewed following labs and imaging studies  CBC: Recent Labs  Lab 05/14/22 2030 05/15/22 0207 05/15/22 0357 05/17/22 0757 05/20/22 0411  WBC 3.9* 5.3 4.4 3.7* 4.0  NEUTROABS 2.0  --   --   --   --   HGB 8.5* 7.9* 7.6* 7.6* 8.4*  HCT 28.2* 25.5* 24.7* 23.4* 26.9*  MCV 96.9 94.1 94.3 91.8 92.4  PLT 361 323 306 406* [id number]   Basic Metabolic Panel: Recent Labs  Lab 05/14/22 2030 05/15/22 0207 05/16/22 0834 05/17/22 0757 05/19/22 0930  NA 134* 131* 131* 133* 134*  K 4.3 4.3 5.6* 3.9 3.7  CL 100  99 100 103 108  CO2 22 25 23 23 $ 21*  GLUCOSE 90 104* 384* 51* 223*  BUN 23 24* 27* 39* 30*  CREATININE 1.25* 1.03  1.06 1.32* 1.83* 1.15  CALCIUM 8.6* 8.2* 8.6* 8.5* 8.1*  MG 2.2  --   --   --  1.9   GFR: Estimated Creatinine Clearance: 55.2 mL/min (by C-G formula based on SCr of 1.15 mg/dL). Liver Function Tests: Recent Labs  Lab 05/14/22 2030 05/15/22 0207  AST 44* 29  ALT 47* 39  ALKPHOS 82 74  BILITOT 0.5 0.6  PROT 8.6* 7.8  ALBUMIN 3.3* 3.0*   No results for input(s): "LIPASE", "AMYLASE" in the last 168 hours. No results for input(s): "AMMONIA" in the last 168 hours. Coagulation Profile: No results for input(s): "INR", "PROTIME" in the last 168 hours. Cardiac Enzymes: Recent Labs  Lab 05/18/22 1722  CKTOTAL 36*   BNP (last 3 results) No results for input(s): "PROBNP" in the last 8760 hours. HbA1C: No results for input(s): "HGBA1C" in the last 72  hours. CBG: Recent Labs  Lab 05/19/22 1648 05/19/22 1715 05/19/22 2051 05/20/22 0020 05/20/22 0859  GLUCAP 32* 164* 273* 286* 401*   Lipid Profile: No results for input(s): "CHOL", "HDL", "LDLCALC", "TRIG", "CHOLHDL", "LDLDIRECT" in the last 72 hours. Thyroid Function Tests: No results for input(s): "TSH", "T4TOTAL", "FREET4", "T3FREE", "THYROIDAB" in the last 72 hours. Anemia Panel: Recent Labs    05/19/22 1251  TIBC 224*  IRON 48   Sepsis Labs: No results for input(s): "PROCALCITON", "LATICACIDVEN" in the last 168 hours.  No results found for this or any previous visit (from the past 240 hour(s)).       Radiology Studies: No results found.      Scheduled Meds:  amoxicillin-clavulanate  1 tablet Oral Q12H   ascorbic acid  500 mg Oral Daily   insulin aspart  0-6 Units Subcutaneous TID WC   insulin detemir  7 Units Subcutaneous Daily   levETIRAcetam  500 mg Oral BID   midodrine  5 mg Oral TID WC   pantoprazole  40 mg Oral BID   sodium chloride flush  3 mL Intravenous Q12H   thiamine  100 mg Oral Daily   Continuous Infusions:  sodium chloride Stopped (05/16/22 1457)     LOS: 5 days       Sidney Ace, MD [facility]   If 7PM-7AM, please contact night-coverage  05/20/2022, 11:50 AM

## 2022-05-20 NOTE — Evaluation (Signed)
Occupational Therapy Evaluation Patient Details Name: Nicholas Weiss MRN: [id number] DOB: [birth year] Today's Date: 05/20/2022   History of Present Illness presented to ER secondary to recurrent falls (related to hypoglycemia?); admitted for management of SDH (68m, stable) and acute fracture to L humerus/greater tubercle.   Clinical Impression   Patient received for OT evaluation. See flowsheet below for details of function. Generally, patient requiring supervision for bed mobility; OT deferred functional mobility 2/2 safety (pt did not have CAM boot for LLE and was orthostatic and dizzy), and MOD A overall for ADLs. Pt extremely orthostatic this session with significant symptoms. Patient will benefit from continued OT while in acute care.      Recommendations for follow up therapy are one component of a multi-disciplinary discharge planning process, led by the attending physician.  Recommendations may be updated based on patient status, additional functional criteria and insurance authorization.   Follow Up Recommendations  Skilled nursing-short term rehab (<3 hours/day)     Assistance Recommended at Discharge Frequent or constant Supervision/Assistance  Patient can return home with the following A lot of help with walking and/or transfers;A lot of help with bathing/dressing/bathroom;Assistance with cooking/housework;Direct supervision/assist for medications management;Direct supervision/assist for financial management;Assist for transportation;Help with stairs or ramp for entrance    Functional Status Assessment  Patient has had a recent decline in their functional status and demonstrates the ability to make significant improvements in function in a reasonable and predictable amount of time.  Equipment Recommendations  Other (comment) (defer to next venue of care)    Recommendations for Other Services       Precautions / Restrictions Precautions Precautions: Fall Required Braces  or Orthoses: Sling (LUE sling; LLE CAM boot (per notes, 2/2 recent toe amputations; boot not in room today)) Restrictions Weight Bearing Restrictions: Yes RUE Weight Bearing: Weight bearing as tolerated LUE Weight Bearing: Non weight bearing (although no ortho note in the chart; OT messaged MD to ask about whether ortho would be involved; MD stated he did not know.) RLE Weight Bearing: Weight bearing as tolerated LLE Weight Bearing: Weight bearing as tolerated (needs CAM (or post-op?) boot, which is not currently in the room) Other Position/Activity Restrictions: Per previous notes, WBAT with CAM boot to L LE      Mobility Bed Mobility Overal bed mobility: Needs Assistance Bed Mobility: Supine to Sit, Sit to Supine     Supine to sit: Supervision Sit to supine: Supervision   General bed mobility comments: symptomatic orthostasis with transition to upright    Transfers                          Balance Overall balance assessment: Needs assistance Sitting-balance support: No upper extremity supported, Feet supported Sitting balance-Leahy Scale: Good                                     ADL either performed or assessed with clinical judgement   ADL Overall ADL's : Needs assistance/impaired Eating/Feeding: Set up;Bed level Eating/Feeding Details (indicate cue type and reason): pt eating some breakfast while OT entered room   Grooming Details (indicate cue type and reason): anticipate set up from seated only   Upper Body Bathing Details (indicate cue type and reason): anticipate MIN A from seated; only able to use RUE   Lower Body Bathing Details (indicate cue type and reason): anticipate MOD A bed level  Upper Body Dressing Details (indicate cue type and reason): anticipate MOD A 2/2 humerus fx; OT assisted dependently for donning sling today Lower Body Dressing: Minimal assistance;Bed level;Sitting/lateral leans Lower Body Dressing Details (indicate  cue type and reason): OT assisted pt MIN A to pull up pants (pants sticking to non-skid socks) at EOB; pt bridged in bed to pull up pants   Toilet Transfer Details (indicate cue type and reason): Not attempted today 2/2 NWB LUE and didn't have LLE CAM boot   Toileting - Clothing Manipulation Details (indicate cue type and reason): anticipate MAX A today   Tub/Shower Transfer Details (indicate cue type and reason): not safe to attempt today   General ADL Comments: Pt significantly limited by LUE/LLE deficits     Vision         Perception     Praxis      Pertinent Vitals/Pain Pain Assessment Pain Assessment: 0-10 Pain Score: 6  Pain Location: L shoulder Pain Descriptors / Indicators: Aching, Guarding, Grimacing Pain Intervention(s): Limited activity within patient's tolerance     Hand Dominance     Extremity/Trunk Assessment Upper Extremity Assessment Upper Extremity Assessment: LUE deficits/detail LUE Deficits / Details: humerus fx from recent fall; has shoulder sling in room; OT donned the sling   Lower Extremity Assessment Lower Extremity Assessment: LLE deficits/detail LLE Deficits / Details: recent toe amputations; is supposed to have CAM boot/surgical shoe, but is not in the room       Communication Communication Communication: No difficulties   Cognition Arousal/Alertness: Awake/alert Behavior During Therapy: WFL for tasks assessed/performed Overall Cognitive Status: Within Functional Limits for tasks assessed                                 General Comments: Pt is grumpy during most of session; does apologize for his mood at end of session; variable levels of alertness during session (first able to sit EOB and with MIN A thread pants without issue, then return to supine; later in session pt sitting EOB and appearing orthostatic and needing to rest head on footboard).     General Comments  Limited by orthostatics. 68/50 sitting EOB. Later 71/50  sitting EOB (after supine rest); once returned to supine 107/74 and dizziness resolved. did not attempt standing today 2/2 safety concerns.    Exercises     Shoulder Instructions      Home Living Family/patient expects to be discharged to:: Private residence Living Arrangements: Spouse/significant other Available Help at Discharge: Family;Available 24 hours/day Type of Home: House Home Access: Stairs to enter [employer] of Steps: 1-2 STE bil railing Entrance Stairs-Rails: Can reach both Home Layout: One level     Bathroom Shower/Tub: Teacher, early years/pre: Standard     Home Equipment: Cane - single point;Tub bench;Grab bars - toilet;Grab bars - tub/shower;Rolling Walker (2 wheels)   Additional Comments: Home set up info from prior therapy notes, as pt grumpy today and not wishing to answer many questions      Prior Functioning/Environment Prior Level of Function : Independent/Modified Independent             Mobility Comments: Pt reports he is (I). 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 Fall from possibly hypoglycemia. ADLs Comments: reports he is (I)        OT Problem List: Decreased strength;Decreased range  of motion;Decreased activity tolerance;Impaired balance (sitting and/or standing);Decreased knowledge of use of DME or AE;Decreased safety awareness      OT Treatment/Interventions: Self-care/ADL training;Therapeutic exercise;Therapeutic activities;DME and/or AE instruction    OT Goals(Current goals can be found in the care plan section) Acute Rehab OT Goals Patient Stated Goal: Pain to stop OT Goal Formulation: With patient Time For Goal Achievement: 06/03/22 Potential to Achieve Goals: Good ADL Goals Pt Will Perform Upper Body Dressing: with modified independence;sitting Pt Will Perform Lower Body Dressing: with modified independence;sitting/lateral  leans Pt Will Transfer to Toilet: with modified independence;bedside commode;ambulating Pt Will Perform Toileting - Clothing Manipulation and hygiene: with modified independence;sit to/from stand  OT Frequency: Min 2X/week    Co-evaluation PT/OT/SLP Co-Evaluation/Treatment: Yes Reason for Co-Treatment: Complexity of the patient's impairments (multi-system involvement);For patient/therapist safety PT goals addressed during session: Mobility/safety with mobility;Other (comment) (weight bearing restrictions) OT goals addressed during session: ADL's and self-care      AM-PAC OT "6 Clicks" Daily Activity     Outcome Measure Help from another person eating meals?: None Help from another person taking care of personal grooming?: A Little Help from another person toileting, which includes using toliet, bedpan, or urinal?: Total Help from another person bathing (including washing, rinsing, drying)?: A Lot Help from another person to put on and taking off regular upper body clothing?: A Little Help from another person to put on and taking off regular lower body clothing?: A Little 6 Click Score: 16   End of Session Equipment Utilized During Treatment: Other (comment) (LUE sling) Nurse Communication: Mobility status  Activity Tolerance: Patient limited by fatigue;Patient limited by pain;Other (comment) (limited by orthostatics) Patient left: in bed;with bed alarm set;with call bell/phone within reach  OT Visit Diagnosis: Unsteadiness on feet (R26.81)                Time: [id number] OT Time Calculation (min): 40 min Charges:  OT General Charges $OT Visit: 1 Visit OT Evaluation $OT Eval Moderate Complexity: 1 Mod OT Treatments $Self Care/Home Management : 8-22 mins  Waymon Amato, MS, OTR/L   Vania Rea 05/20/2022, 1:11 PM

## 2022-05-20 NOTE — TOC Initial Note (Signed)
Transition of Care [facility]) - Initial/Assessment Note    Patient Details  Name: Nicholas Weiss MRN: [id number] Date of Birth: [birth year]  Transition [facility]) CM/SW Contact:    Gerilyn Pilgrim, LCSW Phone Number: 05/20/2022, 11:42 AM  Clinical Narrative:   CSW spoke with patient for HRA assessment. Pt reports that he lives with his girlfriend and has no concerns regarding transportation or getting any of his medications. Pt is agreeable to [facility] with [facility]. Pt states he uses a RW at home and does not need any DME needs. Pt sees Dr. Netty Starring for primary care and wants his medications sent to [facility] on [address]. CSW will reach out to [facility] to inform of discharge.                        Patient Goals and CMS Choice            Expected Discharge Plan and Services                                              Prior Living Arrangements/Services                       Activities of Daily Living Home Assistive Devices/Equipment: Cane (specify quad or straight), Walker (specify type), Eyeglasses ADL Screening (condition at time of admission) Patient's cognitive ability adequate to safely complete daily activities?: Yes Is the patient deaf or have difficulty hearing?: No Does the patient have difficulty seeing, even when wearing glasses/contacts?: No Does the patient have difficulty concentrating, remembering, or making decisions?: No Patient able to express need for assistance with ADLs?: Yes Does the patient have difficulty dressing or bathing?: Yes Independently performs ADLs?: No Communication: Independent Dressing (OT): Independent Grooming: Independent Feeding: Independent Bathing: Needs assistance Is this a change from baseline?: Change from baseline, expected to last <3 days Toileting: Needs assistance Is this a change from baseline?: Change from baseline, expected to last <3 days In/Out Bed: Needs assistance Is  this a change from baseline?: Change from baseline, expected to last <3 days Walks in Home: Needs assistance Is this a change from baseline?: Change from baseline, expected to last <3 days Does the patient have difficulty walking or climbing stairs?: Yes Weakness of Legs: None Weakness of Arms/Hands: None  Permission Sought/Granted                  Emotional Assessment              Admission diagnosis:  Subdural hematoma ([place]) [S06.5XAA] Cocaine abuse ([place]) [F14.10] Hypoglycemia [E16.2] Fall [W19.XXXA] Hypertensive urgency [I16.0] Other closed fracture of shaft of left humerus, initial encounter [S42.392A] Patient Active Problem List   Diagnosis Date Noted   Subdural hematoma ([place]) 05/15/2022   Falls 05/15/2022   Shoulder pain, left 05/15/2022   Hypophosphatemia 04/28/2022   Protein-calorie malnutrition, severe 04/24/2022   Diabetic ulcer of toe associated with diabetes mellitus due to underlying condition, with fat layer exposed ([place]) 04/24/2022   Streptococcal bacteremia 04/24/2022   Leukopenia 04/24/2022   History of multiple myeloma 04/24/2022   Sympathotonic orthostatic hypotension 04/24/2022   Weight loss 04/23/2022   Osteomyelitis of left foot ([place]) 04/22/2022   DKA, type 1 ([place]) 04/22/2022   Dehydration 04/08/2022   Diabetic ketoacidosis without coma associated with type  1 diabetes mellitus ([place]) 04/07/2022   Hyperkalemia 04/07/2022   DKA (diabetic ketoacidosis) ([place]) 04/07/2022   Traumatic pneumothorax 02/18/2022   Multiple fractures of ribs, left side, initial encounter for closed fracture 02/18/2022   Pneumothorax, closed, traumatic 02/18/2022   Benign prostatic hyperplasia with nocturia 02/12/2022   Stroke (cerebrum) ([place]) 11/07/2021   Multiple myeloma ([place]) 09/29/2021   Multiple myeloma not having achieved remission ([place]) 09/29/2021   Chronic hyponatremia 08/18/2021   COVID-19 virus infection 08/13/2021   History of CVA (cerebrovascular accident)  08/13/2021   Syncope and collapse 08/09/2021   Hepatic lesion 08/05/2021   Dyslipidemia 05/08/2021   History of substance abuse ([place]) 12/09/2020   Cocaine abuse ([place]) 11/30/2020   Muscle twitching 09/12/2020   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   Pre-syncope 08/01/2020   Heart block AV first degree 08/01/2020   Anemia of chronic kidney failure, stage 3 (moderate) ([place]) 05/28/2020   PUD (peptic ulcer disease)    Esophageal dysphagia    Encounter for screening colonoscopy    Generalized weakness 07/28/2019   Hypoglycemia 04/27/2019   Unresponsiveness 04/27/2019   Lung nodule 04/07/2019   Uncontrolled type 1 diabetes mellitus    Acute renal failure superimposed on stage 3a chronic kidney disease ([place])    Hypertensive urgency 04/05/2019   CKD (chronic kidney disease) stage 3, GFR 30-59 ml/min ([place]) 04/05/2019   Hypercholesterolemia 01/24/2016   Essential (primary) hypertension 06/07/2006   Type 1 diabetes mellitus with diabetic neuropathy, unspecified ([place]) 05/31/2006   PCP:  Dion Body, MD Pharmacy:   [facility] [address]), [place]) [place] [address] Phone: [phone] Fax: [phone]  [facility] [place], [place] [address] Phone: [phone] Fax: [phone]  [place], [place] [address] Phone: [phone] Fax: [phone]  [name] [address] [address] Phone: [phone] Fax: [phone]     Social Determinants of Health (SDOH) Social History: SDOH Screenings   Food Insecurity: No Food Insecurity (05/15/2022)  Housing: Low Risk  (05/15/2022)  Transportation Needs: No Transportation Needs (05/15/2022)  Utilities: Not At Risk (05/15/2022)  Alcohol Screen: Low Risk   (10/02/2020)  Depression (PHQ2-9): Medium Risk (10/02/2020)  Financial Resource Strain: Low Risk  (01/01/2020)  Physical Activity: Insufficiently Active (01/01/2020)  Social Connections: Moderately Isolated (01/01/2020)  Stress: No Stress Concern Present (01/01/2020)  Tobacco Use: Low Risk  (05/15/2022)   SDOH Interventions:     Readmission Risk Interventions    05/20/2022   11:41 AM 03/12/2022   10:22 AM 02/25/2022    2:43 PM  Readmission Risk Prevention Plan  Transportation Screening Complete Complete Complete  Medication Review Press photographer) Complete Complete Complete  PCP or Specialist appointment within 3-5 days of discharge Complete Complete Complete  HRI or Home Care Consult Complete Complete Complete  SW Recovery Care/Counseling Consult  Complete   Palliative Care Screening Complete Not Applicable Not [place] Complete Not Applicable Not Applicable

## 2022-05-20 NOTE — Progress Notes (Signed)
Pt seen with OT this date. Session modified due to pt not having L CAM boot to allow weight bearing as well as low BP when transferring supine to sit. Vitals passed to MD/nursing. Pt very symptomatic. Education provided to pt regarding current precautions, role of PT, and concerns for safety at home due to pt's poor compliance with personal medical follow through. Will continue to progress as able. Pt asked to have spouse bring in CAM boot.    05/20/22 1000  PT Visit Information  Assistance Needed +1  PT/OT/SLP Co-Evaluation/Treatment Yes  Reason for Co-Treatment Complexity of the patient's impairments (multi-system involvement);For patient/therapist safety  PT goals addressed during session Mobility/safety with mobility;Other (comment) (weight bearing restrictions)  History of Present Illness presented to ER secondary to recurrent falls (related to hypoglycemia?); admitted for management of SDH (96m, stable) and acute fracture to L humerus/greater tubercle.  Subjective Data  Subjective L shoulder pain  Patient Stated Goal to go home  Precautions  Precautions Fall  Required Braces or Orthoses Sling (L UE, CAM Boot L LE)  Restrictions  Weight Bearing Restrictions Yes  LUE Weight Bearing NWB  LLE Weight Bearing WBAT (With CAM Boot)  Other Position/Activity Restrictions Per previous notes, WBAT with CAM boot to L LE  Pain Assessment  Pain Assessment 0-10  Pain Score 6  Pain Location L shoulder  Pain Intervention(s) Limited activity within patient's tolerance  Cognition  Arousal/Alertness Awake/alert  Behavior During Therapy WFL for tasks assessed/performed  Overall Cognitive Status Within Functional Limits for tasks assessed  General Comments "just feel off"  Bed Mobility  Overal bed mobility Needs Assistance  Bed Mobility Supine to Sit;Sit to Supine  Supine to sit Supervision  Sit to supine Supervision  General bed mobility comments symptomatic orthostasis with transition to  upright  Transfers  General transfer comment unsafe/unable  Ambulation/Gait  General Gait Details unsafe/unable  Balance  Overall balance assessment Needs assistance  Sitting-balance support No upper extremity supported;Feet supported  Sitting balance-Leahy Scale Good  Standing balance support  (Unable due to low BP)  General Comments  General comments (skin integrity, edema, etc.) Pt limited due to drop in BP supine to sit, MD and nursing notified and documented in chart. Pt educated on current weight bearing restrictions and recommended spouse bring in CAM boot to progress mobility.  PT - End of Session  Activity Tolerance Patient tolerated treatment well  Patient left in bed;with call bell/phone within reach;with bed alarm set  Nurse Communication Mobility status (Low BP)   PT - Assessment/Plan  PT Visit Diagnosis Other abnormalities of gait and mobility (R26.89);Unsteadiness on feet (R26.81)  PT Frequency (ACUTE ONLY) Min 2X/week  Follow Up Recommendations Home health PT  Assistance recommended at discharge Set up Supervision/Assistance  Patient can return home with the following A little help with walking and/or transfers;A little help with bathing/dressing/bathroom  PT equipment  (TBD)  AM-PAC PT "6 Clicks" Mobility Outcome Measure (Version 2)  Help needed turning from your back to your side while in a flat bed without using bedrails? 4  Help needed moving from lying on your back to sitting on the side of a flat bed without using bedrails? 3  Help needed moving to and from a bed to a chair (including a wheelchair)? 3  Help needed standing up from a chair using your arms (e.g., wheelchair or bedside chair)? 3  Help needed to walk in hospital room? 1  Help needed climbing 3-5 steps with a railing?  1  6 Click Score 15  Consider Recommendation of Discharge To: CIR/SNF/LTACH  Progressive Mobility  What is the highest level of mobility based on the progressive mobility  assessment? Level 2 ([place]) - Balance while sitting on edge of bed and cannot stand  Activity Dangled on edge of bed  PT Time Calculation  PT Start Time (ACUTE ONLY) 1020  PT Stop Time (ACUTE ONLY) 1045  PT Time Calculation (min) (ACUTE ONLY) 25 min  PT General Charges  $$ ACUTE PT VISIT 1 Visit  PT Treatments  $Therapeutic Activity 8-22 mins   Mikel Cella, PTA

## 2022-05-20 NOTE — Inpatient Diabetes Management (Signed)
Inpatient Diabetes Program Recommendations  AACE/ADA: New Consensus Statement on Inpatient Glycemic Control  Target Ranges:  Prepandial:   less than 140 mg/dL      Peak postprandial:   less than 180 mg/dL (1-2 hours)      Critically ill patients:  140 - 180 mg/dL     Review of Glycemic Control  Latest Reference Range & Units 05/19/22 07:31 05/19/22 11:17 05/19/22 12:02 05/19/22 16:48 05/19/22 17:15 05/19/22 20:51 05/20/22 00:20 05/20/22 08:59  Glucose-Capillary 70 - 99 mg/dL 303 (H)  Novolog 7 units 178 (H) 134 (H)  Novolog 1 unit 32 (LL) 164 (H) 273 (H) 286 (H) 401 (H)    Diabetes history: DM1 Outpatient Diabetes medications: Tresiba 20 units daily, Novolog 0-9 units TID, Farxiga 10 mg daily Current orders for Inpatient glycemic control:  Levemir 7 units daily Novolog 0-9 units TID with meals  Note pt is very sensitive to Novolog. May need more titration of basal insulin.   Inpatient Diabetes Program Recommendations:    Pt to get increase Levemir 7 units today.  -   Decrease Novolog Correction scale to "very sensitive" 0-6 units tid + has scale  May need meal coverage later when fasting glucose trends are lower.   Thanks, Tama Headings RN, MSN, BC-ADM Inpatient Diabetes Coordinator Team Pager [phone] (8a-5p)

## 2022-05-21 DIAGNOSIS — I10 Essential (primary) hypertension: Secondary | ICD-10-CM | POA: Diagnosis not present

## 2022-05-21 DIAGNOSIS — W19XXXD Unspecified fall, subsequent encounter: Secondary | ICD-10-CM | POA: Diagnosis not present

## 2022-05-21 LAB — GLUCOSE, CAPILLARY
Glucose-Capillary: 113 mg/dL — ABNORMAL HIGH (ref 70–99)
Glucose-Capillary: 117 mg/dL — ABNORMAL HIGH (ref 70–99)
Glucose-Capillary: 138 mg/dL — ABNORMAL HIGH (ref 70–99)
Glucose-Capillary: 168 mg/dL — ABNORMAL HIGH (ref 70–99)
Glucose-Capillary: 169 mg/dL — ABNORMAL HIGH (ref 70–99)
Glucose-Capillary: 229 mg/dL — ABNORMAL HIGH (ref 70–99)
Glucose-Capillary: 261 mg/dL — ABNORMAL HIGH (ref 70–99)
Glucose-Capillary: 310 mg/dL — ABNORMAL HIGH (ref 70–99)

## 2022-05-21 MED ORDER — INSULIN DETEMIR 100 UNIT/ML ~~LOC~~ SOLN
10.0000 [IU] | Freq: Every day | SUBCUTANEOUS | Status: DC
  Administered 2022-05-22 – 2022-05-28 (×7): 10 [IU] via SUBCUTANEOUS
  Filled 2022-05-21 (×7): qty 0.1

## 2022-05-21 MED ORDER — INSULIN DETEMIR 100 UNIT/ML ~~LOC~~ SOLN
3.0000 [IU] | Freq: Once | SUBCUTANEOUS | Status: DC
  Filled 2022-05-21: qty 0.03

## 2022-05-21 NOTE — NC FL2 (Signed)
[place] LEVEL OF CARE FORM     IDENTIFICATION  Patient Name: Nicholas Weiss Birthdate: 25-Jul-1950 Sex: male Admission Date (Current Location): 05/14/2022  [facility] and [place] Number:  Engineering geologist and Address:  [facility], [address], [place], [place] [address]      Provider Number: [id number]  Attending Physician Name and Address:  Sidney Ace, MD  Relative Name and Phone Number:  Chrystie Nose Denman [phone]    Current Level of Care: Hospital Recommended Level of Care: [place] Prior Approval Number:    Date Approved/Denied: 05/21/22 PASRR Number: [id number]  Discharge Plan: SNF    Current Diagnoses: Patient Active Problem List   Diagnosis Date Noted   Subdural hematoma ([place]) 05/15/2022   Falls 05/15/2022   Shoulder pain, left 05/15/2022   Hypophosphatemia 04/28/2022   Protein-calorie malnutrition, severe 04/24/2022   Diabetic ulcer of toe associated with diabetes mellitus due to underlying condition, with fat layer exposed ([place]) 04/24/2022   Streptococcal bacteremia 04/24/2022   Leukopenia 04/24/2022   History of multiple myeloma 04/24/2022   Sympathotonic orthostatic hypotension 04/24/2022   Weight loss 04/23/2022   Osteomyelitis of left foot ([place]) 04/22/2022   DKA, type 1 ([place]) 04/22/2022   Dehydration 04/08/2022   Diabetic ketoacidosis without coma associated with type 1 diabetes mellitus ([place]) 04/07/2022   Hyperkalemia 04/07/2022   DKA (diabetic ketoacidosis) ([place]) 04/07/2022   Traumatic pneumothorax 02/18/2022   Multiple fractures of ribs, left side, initial encounter for closed fracture 02/18/2022   Pneumothorax, closed, traumatic 02/18/2022   Benign prostatic hyperplasia with nocturia 02/12/2022   Stroke (cerebrum) ([place]) 11/07/2021   Multiple myeloma ([place]) 09/29/2021   Multiple myeloma not having achieved remission ([place]) 09/29/2021   Chronic hyponatremia 08/18/2021    COVID-19 virus infection 08/13/2021   History of CVA (cerebrovascular accident) 08/13/2021   Syncope and collapse 08/09/2021   Hepatic lesion 08/05/2021   Dyslipidemia 05/08/2021   History of substance abuse ([place]) 12/09/2020   Cocaine abuse ([place]) 11/30/2020   Muscle twitching 09/12/2020   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   Pre-syncope 08/01/2020   Heart block AV first degree 08/01/2020   Anemia of chronic kidney failure, stage 3 (moderate) ([place]) 05/28/2020   PUD (peptic ulcer disease)    Esophageal dysphagia    Encounter for screening colonoscopy    Generalized weakness 07/28/2019   Hypoglycemia 04/27/2019   Unresponsiveness 04/27/2019   Lung nodule 04/07/2019   Uncontrolled type 1 diabetes mellitus    Acute renal failure superimposed on stage 3a chronic kidney disease ([place])    Hypertensive urgency 04/05/2019   CKD (chronic kidney disease) stage 3, GFR 30-59 ml/min ([place]) 04/05/2019   Hypercholesterolemia 01/24/2016   Essential (primary) hypertension 06/07/2006   Type 1 diabetes mellitus with diabetic neuropathy, unspecified ([place]) 05/31/2006    Orientation RESPIRATION BLADDER Height & Weight     Self, Time, Situation, Place  Normal Continent Weight: 148 lb 13 oz (67.5 kg) Height:  5' 10"$  (177.8 cm)  BEHAVIORAL SYMPTOMS/MOOD NEUROLOGICAL BOWEL NUTRITION STATUS      Continent Diet (carb modified)  AMBULATORY STATUS COMMUNICATION OF NEEDS Skin   Extensive Assist Verbally  (Wound L second toe, ace wrap every other day betadine; R lateral toe imprenated gauze fam assess daily; pretibial ditstal left later wound epithelialzed)                       Personal Care Assistance Level of Assistance  Dressing, Feeding, Bathing Bathing  Assistance: Maximum assistance Feeding assistance: Limited assistance Dressing Assistance: Maximum assistance     Functional Limitations Info  Sight, Hearing, Speech Sight Info: Adequate Hearing Info: Adequate Speech Info: Adequate     SPECIAL CARE FACTORS FREQUENCY  PT (By licensed PT), OT (By licensed OT)     PT Frequency: 5 times a week OT Frequency: 5 times a week            Contractures Contractures Info: Not present    Additional Factors Info  Code Status, Allergies Code Status Info: FULL Allergies Info: Penicillins           Current Medications (05/21/2022):  This is the current hospital active medication list Current Facility-Administered Medications  Medication Dose Route Frequency Provider Last Rate Last Admin   0.9 %  sodium chloride infusion   Intravenous PRN Agbata, Tochukwu, MD   Stopped at 05/16/22 1457   acetaminophen (TYLENOL) tablet 650 mg  650 mg Oral Q6H PRN Agbata, Tochukwu, MD   650 mg at 05/21/22 0803   Or   acetaminophen (TYLENOL) suppository 650 mg  650 mg Rectal Q6H PRN Agbata, Tochukwu, MD       amoxicillin-clavulanate (AUGMENTIN) 875-125 MG per tablet 1 tablet  1 tablet Oral Q12H Agbata, Tochukwu, MD   1 tablet at 05/21/22 [id number]   ascorbic acid (VITAMIN C) tablet 500 mg  500 mg Oral Daily Agbata, Tochukwu, MD   500 mg at 05/21/22 0804   HYDROcodone-acetaminophen (NORCO/VICODIN) 5-325 MG per tablet 1 tablet  1 tablet Oral Q4H PRN Agbata, Tochukwu, MD   1 tablet at 05/15/22 1443   insulin aspart (novoLOG) injection 0-6 Units  0-6 Units Subcutaneous TID WC Ralene Muskrat B, MD   3 Units at 05/21/22 1231   [START ON 05/22/2022] insulin detemir (LEVEMIR) injection 10 Units  10 Units Subcutaneous Daily Sreenath, Sudheer B, MD       insulin detemir (LEVEMIR) injection 3 Units  3 Units Subcutaneous Once Priscella Mann, Sudheer B, MD       levETIRAcetam (KEPPRA) tablet 500 mg  500 mg Oral BID Dallie Piles, RPH   500 mg at 05/21/22 0804   midodrine (PROAMATINE) tablet 5 mg  5 mg Oral TID WC Sreenath, Sudheer B, MD   5 mg at 05/21/22 1231   morphine (PF) 2 MG/ML injection 2 mg  2 mg Intravenous Q4H PRN Agbata, Tochukwu, MD       ondansetron (ZOFRAN) injection 4 mg  4 mg Intravenous Q6H PRN  Agbata, Tochukwu, MD       pantoprazole (PROTONIX) EC tablet 40 mg  40 mg Oral BID Agbata, Tochukwu, MD   40 mg at 05/21/22 [id number]   sodium chloride flush (NS) 0.9 % injection 3 mL  3 mL Intravenous Q12H Agbata, Tochukwu, MD   3 mL at 05/21/22 [id number]   thiamine (VITAMIN B1) tablet 100 mg  100 mg Oral Daily Agbata, Tochukwu, MD   100 mg at 05/21/22 [id number]     Discharge Medications: Please see discharge summary for a list of discharge medications.  Relevant Imaging Results:  Relevant Lab Results:   Additional Information SS# [id number]  Gerilyn Pilgrim, LCSW

## 2022-05-21 NOTE — Inpatient Diabetes Management (Signed)
Inpatient Diabetes Program Recommendations  AACE/ADA: New Consensus Statement on Inpatient Glycemic Control (2015)  Target Ranges:  Prepandial:   less than 140 mg/dL      Peak postprandial:   less than 180 mg/dL (1-2 hours)      Critically ill patients:  140 - 180 mg/dL    Latest Reference Range & Units 05/20/22 00:20 05/20/22 08:59 05/20/22 12:45 05/20/22 15:25 05/20/22 18:39 05/20/22 19:36 05/20/22 21:21  Glucose-Capillary 70 - 99 mg/dL 286 (H) 401 (H)  9 units Novolog  7 units Levemir 250 (H) 218 (H) 281 (H)  2 units Novolog  228 (H) 233 (H)  (H): Data is abnormally high  Latest Reference Range & Units 05/21/22 00:20 05/21/22 02:19 05/21/22 04:36 05/21/22 08:01  Glucose-Capillary 70 - 99 mg/dL 138 (H) 117 (H) 229 (H) 310 (H)  4 units Novolog   (H): Data is abnormally high    History: Type 1 diabetes  Home DM Meds:  Tresiba 20 units daily       Novolog 0-9 units TID        Farxiga 10 mg daily        Dexcom G6 CGM  Current Orders: Levemir 7 units daily     Novolog 0-6 units TID    MD- Note Novolog NOT given yesterday at 12pm nor 6pm.  CBG rebounded to 310 this AM--Allowed PO diet  Please consider:  1. Increase Levemir to 10 units Daily If 7 unit dose already given, please consider giving an extra 3 units Levemir X 1 dose today as well  2. May consider adding low dose Novolog Meal Coverage: Novolog 2 units TID with meals HOLD if  pt NPO HOLD if pt eats <50% meals     ENDO: Dr. Gabriel Carina Last Seen 03/16/2022--A1c at that visit was 9.3% Was told to take the following:  Continue Tresiba 15 units every morning  IF EATING Take 3 unit of NovoLog if your sugar is 80 - 120 Take 5 unit of NovoLog if your sugar is 121-170 Take 7 units of NovoLog if your sugar is 171-220 Take 9 units of NovoLog if your sugar is 221-270 Take 10 units of NovoLog if your sugar is 271 or higher  IF NOT EATING (AND YOU HAVE NOT TAKEN NOVOLOG IN THE LAST 3 HOURS) Take 2 units of  NovoLog if your sugar is 201-250 Take 4 units of NovoLog if your sugar is 251-300 Take 6 units of NovoLog if your sugar is 301 or higher    --Will follow patient during hospitalization--  Wyn Quaker RN, MSN, [place] Diabetes Coordinator Inpatient Glycemic Control Team Team Pager: [phone] (8a-5p)

## 2022-05-21 NOTE — TOC Progression Note (Addendum)
Transition of Care [place]) - Progression Note    Patient Details  Name: Nicholas Weiss MRN: [id number] Date of Birth: 1950-10-01  [facility] CM/SW Contact  Gerilyn Pilgrim, LCSW Phone Number: 05/21/2022, 1:28 PM  Clinical Narrative:  CSW spoke with pt about updated recommendations for SNF. Pt reports he has never went to rehab but would like to as he would like to return home stronger. Pt would like CSW to send referrals out to the surrounding area.     2:00PM Referrals faxed to surrounding areas.       Expected Discharge Plan and Services                                               Social Determinants of Health (SDOH) Interventions SDOH Screenings   Food Insecurity: No Food Insecurity (05/15/2022)  Housing: Low Risk  (05/15/2022)  Transportation Needs: No Transportation Needs (05/15/2022)  Utilities: Not At Risk (05/15/2022)  Alcohol Screen: Low Risk  (10/02/2020)  Depression (PHQ2-9): Medium Risk (10/02/2020)  Financial Resource Strain: Low Risk  (01/01/2020)  Physical Activity: Insufficiently Active (01/01/2020)  Social Connections: Moderately Isolated (01/01/2020)  Stress: No Stress Concern Present (01/01/2020)  Tobacco Use: Low Risk  (05/15/2022)    Readmission Risk Interventions    05/20/2022   11:41 AM 03/12/2022   10:22 AM 02/25/2022    2:43 PM  Readmission Risk Prevention Plan  Transportation Screening Complete Complete Complete  Medication Review Press photographer) Complete Complete Complete  PCP or Specialist appointment within 3-5 days of discharge Complete Complete Complete  HRI or Home Care Consult Complete Complete Complete  SW Recovery Care/Counseling Consult  Complete   Palliative Care Screening Complete Not Applicable Not [place] Complete Not Applicable Not Applicable

## 2022-05-21 NOTE — Care Management Important Message (Signed)
Important Message  Patient Details  Name: Nicholas Weiss MRN: [id number] Date of Birth: 03-26-1951   Medicare Important Message Given:  Yes     Juliann Pulse A Paticia Moster 05/21/2022, 11:36 AM

## 2022-05-21 NOTE — Progress Notes (Addendum)
Physical Therapy Treatment Patient Details Name: Nicholas Weiss MRN: [id number] DOB: [birth year] Today's Date: 05/21/2022   History of Present Illness presented to ER secondary to recurrent falls (related to hypoglycemia?); admitted for management of SDH (71m, stable) and acute fracture to L humerus/greater tubercle.    PT Comments    Pt was seated EOB with RN + RN tech at bedside. Pt had stood EOB and missed urinal with attempts to stand and pee. He presents with unsteadiness in standing. Per RN, pt did not endorse dizziness until asked. He's BP after standing and returning to sitting EOB 91/67 (71). Author did apply CAM boot to LLE as ordered. Stood and pivot to recliner from EOB fairly well but pt endorses not being able to ambulate further due to weakness, fatigue and dizziness. DC disposition recs were changed to SNF due to pt's high likelihood of falls, limited assistance at home, and limited activity tolerance. Acute PT will continue to follow per current POC.    Recommendations for follow up therapy are one component of a multi-disciplinary discharge planning process, led by the attending physician.  Recommendations may be updated based on patient status, additional functional criteria and insurance authorization.  Follow Up Recommendations  Skilled nursing-short term rehab (<3 hours/day)     Assistance Recommended at Discharge Frequent or constant Supervision/Assistance  Patient can return home with the following A little help with walking and/or transfers;A little help with bathing/dressing/bathroom   Equipment Recommendations  Other (comment) (defer to next level of care)       Precautions / Restrictions Precautions Precautions: Fall Required Braces or Orthoses: Sling Restrictions Weight Bearing Restrictions: Yes LUE Weight Bearing: Non weight bearing Other Position/Activity Restrictions: Per previous notes, WBAT with CAM boot to L LE     Mobility  Bed Mobility   General bed mobility comments: pt was seated EOB with RN + RN tech in room    Transfers Overall transfer level: Needs assistance Equipment used: Rolling walker (2 wheels) Transfers: Sit to/from Stand Sit to Stand: Min guard, Min assist    General transfer comment: Pt is unsteady but was able to stand to RW with CGA. Min assist at times in standing due to unsteadiness and LOB    Ambulation/Gait Ambulation/Gait assistance: Min guard Gait Distance (Feet): 3 Feet Assistive device: Rolling walker (2 wheels) Gait Pattern/deviations: Step-to pattern  General Gait Details: pt was issued CAM boot. Was able to take a few steps form EOB to recliner however endorses dizziness/fatigue and is unwilling to attempt ambulation further. pt is agreeable to SNF at DC    Balance Overall balance assessment: Needs assistance Sitting-balance support: No upper extremity supported, Feet supported Sitting balance-Leahy Scale: Good     Standing balance support: During functional activity, Reliant on assistive device for balance Standing balance-Leahy Scale: Poor Standing balance comment: high fall risk due to orthostatic hypotension concerns + pt poro safety awareness+ unsteady in standing    Cognition Arousal/Alertness: Awake/alert Behavior During Therapy: WFL for tasks assessed/performed Overall Cognitive Status: Within Functional Limits for tasks assessed      General Comments: Pt was A but has poor awareness of deficits. RN staff in room assiting tp with hygiene after pt urinated on himself attempting to use urinal           General Comments General comments (skin integrity, edema, etc.): BP was 91/67 (71) after having already been up and standing with RN staff      Pertinent Vitals/Pain Pain Assessment Pain Score: 0-No  pain Pain Descriptors / Indicators: Guarding Pain Intervention(s): Limited activity within patient's tolerance, Monitored during session, Premedicated before session,  Repositioned     PT Goals (current goals can now be found in the care plan section) Acute Rehab PT Goals Patient Stated Goal: to go home Progress towards PT goals: Progressing toward goals    Frequency    Min 2X/week      PT Plan Discharge plan needs to be updated    Co-evaluation     PT goals addressed during session: Mobility/safety with mobility;Other (comment)        AM-PAC PT "6 Clicks" Mobility   Outcome Measure  Help needed turning from your back to your side while in a flat bed without using bedrails?: None Help needed moving from lying on your back to sitting on the side of a flat bed without using bedrails?: A Little Help needed moving to and from a bed to a chair (including a wheelchair)?: A Little Help needed standing up from a chair using your arms (e.g., wheelchair or bedside chair)?: A Little Help needed to walk in hospital room?: Total Help needed climbing 3-5 steps with a railing? : Total 6 Click Score: 15    End of Session   Activity Tolerance: Patient tolerated treatment well;Patient limited by fatigue Patient left: in chair;with call bell/phone within reach;with chair alarm set;with nursing/sitter in room Nurse Communication: Mobility status PT Visit Diagnosis: Other abnormalities of gait and mobility (R26.89);Unsteadiness on feet (R26.81)     Time: 1040-1055 PT Time Calculation (min) (ACUTE ONLY): 15 min  Charges:  $Therapeutic Activity: 8-22 mins                    Julaine Fusi PTA 05/21/22, 11:56 AM

## 2022-05-21 NOTE — Progress Notes (Signed)
PROGRESS NOTE    Nicholas Weiss  [id number] DOB: 1950/09/26 DOA: 05/14/2022 PCP: Dion Body, MD    Brief Narrative:  72 y.o. man from community with past medical history of diabetes melitis type I, hypoglycemia, hypertension, DKA and history of multiple falls who presented to the ED with complaints of fall.  Patient had multiple admissions for DKA in the past.  Was found to be in hypertensive urgency on presentation.  Patient was evaluated with CT scan of the head which showed subdural hematoma 7 mm in size.  Neurosurgery was consulted Dr Cari Caraway, patient was started on keppra for prophylaxis bid 500 mg bid x 7 days.  At the time of my evaluation patient was back to his baseline, confused.  No complaint of headache, blurring of vision, chest pain, palpitation, shortness of breath or abdominal pain.  No focal weakness.  No loss of consciousness no bowel bladder incontinence.   Patient was admitted to stepdown unit for maintenance of blood pressure in the setting of subdural hematoma.  Patient was kept on nicardipine drip.  Which was discontinued on 2/17 as blood pressure was in stable range.  Patient however developed hyperkalemia with potassium of 5.6 and hyponatremia 131.  Started patient on IV normal saline for worsening renal function with creatinine of 1.32.   2/17 : Patient seen and examined this morning.  No overnight events.  Back to baseline.  Had an episode of hypoglycemia with blood glucose of 20.  Repleted with improvement in mental status.  Continued with D10 half-normal saline for now.  Had hyperkalemia with potassium of 5.6 ordered Lokelma.  Insulin dose decreased to 6 units nightly with corrective scale with meals.   2/18 : Patient seen and examined this morning no overnight events.  Had early morning hypoglycemia managed with hypoglycemia protocol.  Patient morning glucose in the range of 120s to 150s.  Patient did not receive his Lantus last night.  Reports that he  takes [name] 15 units in the morning daily with no issues.  However patient has been having multiple falls likely secondary to hypoglycemia will need adjustment of insulin and endocrinology follow-up in the outpatient setting.  Patient presenting symptoms have resolved however needs optimization of insulin prior to discharge.   2/19: Patient is seen and examined at the bedside.  He is awake and alert and is oriented to person and place but not to time.   2/20 : Patient is seen and examined at bedside.  Eating breakfast and appears comfortable and in no distress.  2/21: Remains orthostatic and symptomatic 2/22: Remains orthostatic.  Less so than yesterday.  Assessment & Plan:   Principal Problem:   Falls Active Problems:   Acute renal failure superimposed on stage 3a chronic kidney disease (HCC)   Essential (primary) hypertension   Cocaine abuse ([place])   History of substance abuse ([place])   Hypertensive urgency   Anemia of chronic kidney failure, stage 3 (moderate) (HCC)   Chronic hyponatremia   Benign prostatic hyperplasia with nocturia   Subdural hematoma (HCC)   Shoulder pain, left  Falls Orthostatic hypotension Pt sustained head injury and ct showed SDH. ;IMPRESSION: 1. Small right frontal subdural hematoma measuring 7 mm in thickness with mild mass effect upon the subjacent right frontal cortical gyri. No midline shift. 2. Stable mild  Evaluated by PT and noted to have orthostatic blood pressure changes which could have contributed to his frequent falls Plan: Patient less orthostatic today though remains very symptomatic.  Continue midodrine  5 mg 3 times daily.  Discharge recommendations updated to skilled nursing facility.   Hyperkalemia  Improved   Diabetes mellitus type 1  Patient is on oral hypoglycemic agent along with [name].   On admission patient was placed on corrective scale insulin along with Lantus 7 units.    Has had issues with hypoglycemia and labile  sugars Plan: Increase Semglee 10 units daily Decrease sliding scale to very sensitive No nightly coverage       Acute renal failure superimposed on stage 3a chronic kidney disease (HCC) Renal function has improved and is back to baseline CK levels are within normal limits     Cocaine abuse  High risk for IC bleed.  Patient has been counseled on the need to abstain from further cocaine use   Hypertensive urgency Pt started on nicardipine drip for hypertensive urgency.  Has been off since 02/17 Noted to have orthostatic blood pressure changes and started on midodrine BP reasonably well-controlled   Shoulder pain, left Cont with prn pain meds and fall precaution. Attribute to fall.    Subdural hematoma  Continue keppra per neurosurgery recommendation to complete a 7-day course of therapy     Chronic hyponatremia Stable and mild.   Anemia of chronic kidney failure, stage 3 (moderate)  Attribute history of multiple myeloma (no ER evaluation). Transfuse as needed       Osteomyelitis Status post recent amputation of left great toe and left partial second toe amputation Continue Augmentin to complete the course    DVT prophylaxis: SCD Code Status: Full Family Communication: None Disposition Plan: Status is: Inpatient Remains inpatient appropriate because: Symptomatic orthostatic hypotension   Level of care: Telemetry Medical  Consultants:  None  Procedures:  None  Antimicrobials: None   Subjective: Seen and examined.  Resting comfortably in bed.  Poor historian.  No pain complaints  Objective: Vitals:   05/20/22 1936 05/21/22 0435 05/21/22 0500 05/21/22 0801  BP: (!) 145/93 100/69  (!) 142/77  Pulse: 71 69  87  Resp: 18 16  17  $ Temp: 98.4 F (36.9 C) 98.1 F (36.7 C)  98.6 F (37 C)  TempSrc:  Oral    SpO2: 100% 100%  99%  Weight:   67.5 kg   Height:        Intake/Output Summary (Last 24 hours) at 05/21/2022 1154 Last data filed at 05/21/2022  1031 Gross per 24 hour  Intake 0 ml  Output 725 ml  Net -725 ml   Filed Weights   05/14/22 2027 05/17/22 0500 05/21/22 0500  Weight: 68.9 kg 64.4 kg 67.5 kg    Examination:  General exam: NAD.  Appears chronically ill Respiratory system: Lungs clear.  Normal work of breathing.  Room air Cardiovascular system: S1-S2, RRR, no murmurs, no pedal edema Gastrointestinal system: Soft, NT/ND, normal bowel sounds Central nervous system: Alert and oriented. No focal neurological deficits. Extremities: Bilateral lower extremity weakness Skin: No rashes, lesions or ulcers Psychiatry: Judgement and insight appear normal. Mood & affect flattened.     Data Reviewed: I have personally reviewed following labs and imaging studies  CBC: Recent Labs  Lab 05/14/22 2030 05/15/22 0207 05/15/22 0357 05/17/22 0757 05/20/22 0411  WBC 3.9* 5.3 4.4 3.7* 4.0  NEUTROABS 2.0  --   --   --   --   HGB 8.5* 7.9* 7.6* 7.6* 8.4*  HCT 28.2* 25.5* 24.7* 23.4* 26.9*  MCV 96.9 94.1 94.3 91.8 92.4  PLT 361 323 306 406* 357  Basic Metabolic Panel: Recent Labs  Lab 05/14/22 2030 05/15/22 0207 05/16/22 0834 05/17/22 0757 05/19/22 0930  NA 134* 131* 131* 133* 134*  K 4.3 4.3 5.6* 3.9 3.7  CL 100 99 100 103 108  CO2 22 25 23 23 $ 21*  GLUCOSE 90 104* 384* 51* 223*  BUN 23 24* 27* 39* 30*  CREATININE 1.25* 1.03  1.06 1.32* 1.83* 1.15  CALCIUM 8.6* 8.2* 8.6* 8.5* 8.1*  MG 2.2  --   --   --  1.9   GFR: Estimated Creatinine Clearance: 57.9 mL/min (by C-G formula based on SCr of 1.15 mg/dL). Liver Function Tests: Recent Labs  Lab 05/14/22 2030 05/15/22 0207  AST 44* 29  ALT 47* 39  ALKPHOS 82 74  BILITOT 0.5 0.6  PROT 8.6* 7.8  ALBUMIN 3.3* 3.0*   No results for input(s): "LIPASE", "AMYLASE" in the last 168 hours. No results for input(s): "AMMONIA" in the last 168 hours. Coagulation Profile: No results for input(s): "INR", "PROTIME" in the last 168 hours. Cardiac Enzymes: Recent Labs  Lab  05/18/22 1722  CKTOTAL 36*   BNP (last 3 results) No results for input(s): "PROBNP" in the last 8760 hours. HbA1C: Recent Labs    05/20/22 0411  HGBA1C 9.1*   CBG: Recent Labs  Lab 05/20/22 2121 05/21/22 0020 05/21/22 0219 05/21/22 0436 05/21/22 0801  GLUCAP 233* 138* 117* 229* 310*   Lipid Profile: No results for input(s): "CHOL", "HDL", "LDLCALC", "TRIG", "CHOLHDL", "LDLDIRECT" in the last 72 hours. Thyroid Function Tests: No results for input(s): "TSH", "T4TOTAL", "FREET4", "T3FREE", "THYROIDAB" in the last 72 hours. Anemia Panel: Recent Labs    05/19/22 1251  TIBC 224*  IRON 48   Sepsis Labs: No results for input(s): "PROCALCITON", "LATICACIDVEN" in the last 168 hours.  No results found for this or any previous visit (from the past 240 hour(s)).       Radiology Studies: No results found.      Scheduled Meds:  amoxicillin-clavulanate  1 tablet Oral Q12H   ascorbic acid  500 mg Oral Daily   insulin aspart  0-6 Units Subcutaneous TID WC   [START ON 05/22/2022] insulin detemir  10 Units Subcutaneous Daily   insulin detemir  3 Units Subcutaneous Once   levETIRAcetam  500 mg Oral BID   midodrine  5 mg Oral TID WC   pantoprazole  40 mg Oral BID   sodium chloride flush  3 mL Intravenous Q12H   thiamine  100 mg Oral Daily   Continuous Infusions:  sodium chloride Stopped (05/16/22 1457)     LOS: 6 days       Sidney Ace, MD [facility]   If 7PM-7AM, please contact night-coverage  05/21/2022, 11:54 AM

## 2022-05-22 DIAGNOSIS — I10 Essential (primary) hypertension: Secondary | ICD-10-CM | POA: Diagnosis not present

## 2022-05-22 DIAGNOSIS — W19XXXD Unspecified fall, subsequent encounter: Secondary | ICD-10-CM | POA: Diagnosis not present

## 2022-05-22 LAB — BASIC METABOLIC PANEL
Anion gap: 7 (ref 5–15)
BUN: 30 mg/dL — ABNORMAL HIGH (ref 8–23)
CO2: 22 mmol/L (ref 22–32)
Calcium: 8.2 mg/dL — ABNORMAL LOW (ref 8.9–10.3)
Chloride: 102 mmol/L (ref 98–111)
Creatinine, Ser: 1.12 mg/dL (ref 0.61–1.24)
GFR, Estimated: >60 mL/min (ref >60)
Glucose, Bld: 305 mg/dL — ABNORMAL HIGH (ref 70–99)
Potassium: 4.5 mmol/L (ref 3.5–5.1)
Sodium: 131 mmol/L — ABNORMAL LOW (ref 135–145)

## 2022-05-22 LAB — CBC WITH DIFFERENTIAL/PLATELET
Abs Immature Granulocytes: 0.01 10*3/uL (ref 0.00–0.07)
Basophils Absolute: 0 10*3/uL (ref 0.0–0.1)
Basophils Relative: 0 %
Eosinophils Absolute: 0.1 10*3/uL (ref 0.0–0.5)
Eosinophils Relative: 4 %
HCT: 24.8 % — ABNORMAL LOW (ref 39.0–52.0)
Hemoglobin: 8 g/dL — ABNORMAL LOW (ref 13.0–17.0)
Immature Granulocytes: 0 %
Lymphocytes Relative: 42 %
Lymphs Abs: 1.1 10*3/uL (ref 0.7–4.0)
MCH: 29.5 pg (ref 26.0–34.0)
MCHC: 32.3 g/dL (ref 30.0–36.0)
MCV: 91.5 fL (ref 80.0–100.0)
Monocytes Absolute: 0.3 10*3/uL (ref 0.1–1.0)
Monocytes Relative: 10 %
Neutro Abs: 1.2 10*3/uL — ABNORMAL LOW (ref 1.7–7.7)
Neutrophils Relative %: 44 %
Platelets: 355 10*3/uL (ref 150–400)
RBC: 2.71 MIL/uL — ABNORMAL LOW (ref 4.22–5.81)
RDW: 17.1 % — ABNORMAL HIGH (ref 11.5–15.5)
WBC: 2.7 10*3/uL — ABNORMAL LOW (ref 4.0–10.5)
nRBC: 0 % (ref 0.0–0.2)

## 2022-05-22 LAB — GLUCOSE, CAPILLARY
Glucose-Capillary: 299 mg/dL — ABNORMAL HIGH (ref 70–99)
Glucose-Capillary: 260 mg/dL — ABNORMAL HIGH (ref 70–99)
Glucose-Capillary: 109 mg/dL — ABNORMAL HIGH (ref 70–99)
Glucose-Capillary: 237 mg/dL — ABNORMAL HIGH (ref 70–99)

## 2022-05-22 MED ORDER — MIDODRINE HCL 5 MG PO TABS
10.0000 mg | ORAL_TABLET | Freq: Three times a day (TID) | ORAL | Status: DC
  Administered 2022-05-22 – 2022-05-26 (×12): 10 mg via ORAL
  Filled 2022-05-22 (×12): qty 2

## 2022-05-22 NOTE — Progress Notes (Signed)
PROGRESS NOTE    Nicholas Weiss  [id number] DOB: 1950-04-10 DOA: 05/14/2022 PCP: Dion Body, MD    Brief Narrative:  72 y.o. man from community with past medical history of diabetes melitis type I, hypoglycemia, hypertension, DKA and history of multiple falls who presented to the ED with complaints of fall.  Patient had multiple admissions for DKA in the past.  Was found to be in hypertensive urgency on presentation.  Patient was evaluated with CT scan of the head which showed subdural hematoma 7 mm in size.  Neurosurgery was consulted Dr Cari Caraway, patient was started on keppra for prophylaxis bid 500 mg bid x 7 days.  At the time of my evaluation patient was back to his baseline, confused.  No complaint of headache, blurring of vision, chest pain, palpitation, shortness of breath or abdominal pain.  No focal weakness.  No loss of consciousness no bowel bladder incontinence.   Patient was admitted to stepdown unit for maintenance of blood pressure in the setting of subdural hematoma.  Patient was kept on nicardipine drip.  Which was discontinued on 2/17 as blood pressure was in stable range.  Patient however developed hyperkalemia with potassium of 5.6 and hyponatremia 131.  Started patient on IV normal saline for worsening renal function with creatinine of 1.32.   2/17 : Patient seen and examined this morning.  No overnight events.  Back to baseline.  Had an episode of hypoglycemia with blood glucose of 20.  Repleted with improvement in mental status.  Continued with D10 half-normal saline for now.  Had hyperkalemia with potassium of 5.6 ordered Lokelma.  Insulin dose decreased to 6 units nightly with corrective scale with meals.   2/18 : Patient seen and examined this morning no overnight events.  Had early morning hypoglycemia managed with hypoglycemia protocol.  Patient morning glucose in the range of 120s to 150s.  Patient did not receive his Lantus last night.  Reports that he  takes [name] 15 units in the morning daily with no issues.  However patient has been having multiple falls likely secondary to hypoglycemia will need adjustment of insulin and endocrinology follow-up in the outpatient setting.  Patient presenting symptoms have resolved however needs optimization of insulin prior to discharge.   2/19: Patient is seen and examined at the bedside.  He is awake and alert and is oriented to person and place but not to time.   2/20 : Patient is seen and examined at bedside.  Eating breakfast and appears comfortable and in no distress.  2/21: Remains orthostatic and symptomatic 2/22: Remains orthostatic.  Less so than yesterday. 2/23: Remains orthostatic  Assessment & Plan:   Principal Problem:   Falls Active Problems:   Acute renal failure superimposed on stage 3a chronic kidney disease (HCC)   Essential (primary) hypertension   Cocaine abuse ([place])   History of substance abuse ([place])   Hypertensive urgency   Anemia of chronic kidney failure, stage 3 (moderate) (HCC)   Chronic hyponatremia   Benign prostatic hyperplasia with nocturia   Subdural hematoma (HCC)   Shoulder pain, left  Falls Orthostatic hypotension Pt sustained head injury and ct showed SDH. ;IMPRESSION: 1. Small right frontal subdural hematoma measuring 7 mm in thickness with mild mass effect upon the subjacent right frontal cortical gyri. No midline shift. 2. Stable mild  Evaluated by PT and noted to have orthostatic blood pressure changes which could have contributed to his frequent falls Plan: Orthostatic hypotension persists.  Increase midodrine to 10  mg 3 times daily.  Patient agreeable to skilled nursing facility.   Hyperkalemia  Improved   Diabetes mellitus type 1  Patient is on oral hypoglycemic agent along with [name].   On admission patient was placed on corrective scale insulin along with Lantus 7 units.    Has had issues with hypoglycemia and labile sugars Plan: Continue  Semglee 10 units daily Decrease sliding scale to very sensitive No nightly coverage       Acute renal failure superimposed on stage 3a chronic kidney disease (HCC) Renal function has improved and is back to baseline CK levels are within normal limits     Cocaine abuse  High risk for IC bleed.  Patient has been counseled on the need to abstain from further cocaine use   Hypertensive urgency Pt started on nicardipine drip for hypertensive urgency.  Has been off since 02/17 Noted to have orthostatic blood pressure changes and started on midodrine BP reasonably well-controlled   Shoulder pain, left Cont with prn pain meds and fall precaution. Attribute to fall.    Subdural hematoma  Continue keppra per neurosurgery recommendation to complete a 7-day course of therapy     Chronic hyponatremia Stable and mild.   Anemia of chronic kidney failure, stage 3 (moderate)  Attribute history of multiple myeloma (no ER evaluation). Transfuse as needed       Osteomyelitis Status post recent amputation of left great toe and left partial second toe amputation Continue Augmentin to complete the course    DVT prophylaxis: SCD Code Status: Full Family Communication: None Disposition Plan: Status is: Inpatient Remains inpatient appropriate because: Symptomatic orthostatic hypotension   Level of care: Telemetry Medical  Consultants:  None  Procedures:  None  Antimicrobials: None   Subjective: Seen and examined.  Resting in bed.  Poor historian.  Endorses dizziness.  Objective: Vitals:   05/21/22 2046 05/22/22 0500 05/22/22 0554 05/22/22 0744  BP: (!) 163/95  (!) 140/78 (!) 141/84  Pulse: 69  71 72  Resp: '16  16 18  '$ Temp: 97.7 F (36.5 C)  98.3 F (36.8 C) 98 F (36.7 C)  TempSrc: Oral  Oral   SpO2: 100%  99% 99%  Weight:  65.7 kg    Height:        Intake/Output Summary (Last 24 hours) at 05/22/2022 1044 Last data filed at 05/22/2022 1018 Gross per 24 hour   Intake 240 ml  Output 550 ml  Net -310 ml   Filed Weights   05/17/22 0500 05/21/22 0500 05/22/22 0500  Weight: 64.4 kg 67.5 kg 65.7 kg    Examination:  General exam: No acute distress.  Frail-appearing Respiratory system: Lungs clear.  Normal work of breathing.  Room air Cardiovascular system: S1-S2, RRR, no murmurs, no pedal edema Gastrointestinal system: Soft, NT/ND, normal bowel sounds Central nervous system: Alert and oriented. No focal neurological deficits. Extremities: Bilateral lower extremity weakness, left lower extremity in surgical boot Skin: No rashes, lesions or ulcers Psychiatry: Judgement and insight appear normal. Mood & affect flattened.     Data Reviewed: I have personally reviewed following labs and imaging studies  CBC: Recent Labs  Lab 05/17/22 0757 05/20/22 0411 05/22/22 0726  WBC 3.7* 4.0 2.7*  NEUTROABS  --   --  1.2*  HGB 7.6* 8.4* 8.0*  HCT 23.4* 26.9* 24.8*  MCV 91.8 92.4 91.5  PLT 406* 357 [id number]   Basic Metabolic Panel: Recent Labs  Lab 05/16/22 0834 05/17/22 0757 05/19/22 0930 05/22/22 0726  NA  131* 133* 134* 131*  K 5.6* 3.9 3.7 4.5  CL 100 103 108 102  CO2 23 23 21* 22  GLUCOSE 384* 51* 223* 305*  BUN 27* 39* 30* 30*  CREATININE 1.32* 1.83* 1.15 1.12  CALCIUM 8.6* 8.5* 8.1* 8.2*  MG  --   --  1.9  --    GFR: Estimated Creatinine Clearance: 57.8 mL/min (by C-G formula based on SCr of 1.12 mg/dL). Liver Function Tests: No results for input(s): "AST", "ALT", "ALKPHOS", "BILITOT", "PROT", "ALBUMIN" in the last 168 hours.  No results for input(s): "LIPASE", "AMYLASE" in the last 168 hours. No results for input(s): "AMMONIA" in the last 168 hours. Coagulation Profile: No results for input(s): "INR", "PROTIME" in the last 168 hours. Cardiac Enzymes: Recent Labs  Lab 05/18/22 1722  CKTOTAL 36*   BNP (last 3 results) No results for input(s): "PROBNP" in the last 8760 hours. HbA1C: Recent Labs    05/20/22 0411  HGBA1C  9.1*   CBG: Recent Labs  Lab 05/21/22 1156 05/21/22 1603 05/21/22 1801 05/21/22 2046 05/22/22 0740  GLUCAP 261* 113* 168* 169* 299*   Lipid Profile: No results for input(s): "CHOL", "HDL", "LDLCALC", "TRIG", "CHOLHDL", "LDLDIRECT" in the last 72 hours. Thyroid Function Tests: No results for input(s): "TSH", "T4TOTAL", "FREET4", "T3FREE", "THYROIDAB" in the last 72 hours. Anemia Panel: Recent Labs    05/19/22 1251  TIBC 224*  IRON 48   Sepsis Labs: No results for input(s): "PROCALCITON", "LATICACIDVEN" in the last 168 hours.  No results found for this or any previous visit (from the past 240 hour(s)).       Radiology Studies: No results found.      Scheduled Meds:  amoxicillin-clavulanate  1 tablet Oral Q12H   ascorbic acid  500 mg Oral Daily   insulin aspart  0-6 Units Subcutaneous TID WC   insulin detemir  10 Units Subcutaneous Daily   insulin detemir  3 Units Subcutaneous Once   levETIRAcetam  500 mg Oral BID   midodrine  10 mg Oral TID WC   pantoprazole  40 mg Oral BID   sodium chloride flush  3 mL Intravenous Q12H   thiamine  100 mg Oral Daily   Continuous Infusions:  sodium chloride Stopped (05/16/22 1457)     LOS: 7 days       Sidney Ace, MD [facility]   If 7PM-7AM, please contact night-coverage  05/22/2022, 10:44 AM

## 2022-05-22 NOTE — Progress Notes (Signed)
Occupational Therapy Treatment Patient Details Name: Nicholas Weiss MRN: [id number] DOB: 04/15/1950 Today's Date: 05/22/2022   History of present illness presented to ER secondary to recurrent falls (related to hypoglycemia?); admitted for management of SDH (30m, stable) and acute fracture to L humerus/greater tubercle.   OT comments  Upon entering the room, pt seated in recliner chair and is agreeable to OT intervention. OT taking pt's BP while seated with results of 114/70 (MAP 90) and HR of 84 bpm. OT unable to locate sling during session. Pt stands with max cuing to not utilize L UE and OT needing to manage it during session. Pt stands and BP of 61/47( MAP 54) and HR of 98. Pt quickly transferred back to bed and use of bed features to place pt into trendelenburg. Pt is asymptomatic and RN and MD notified. Pt placed back into a position of comfort and all needs within reach with pt reporting feeling better. Orthostatic hypotension was a barrier to being able to advance this pt further during session.    Recommendations for follow up therapy are one component of a multi-disciplinary discharge planning process, led by the attending physician.  Recommendations may be updated based on patient status, additional functional criteria and insurance authorization.    Follow Up Recommendations  Skilled nursing-short term rehab (<3 hours/day)     Assistance Recommended at Discharge Frequent or constant Supervision/Assistance  Patient can return home with the following  A lot of help with walking and/or transfers;A lot of help with bathing/dressing/bathroom;Assistance with cooking/housework;Direct supervision/assist for medications management;Direct supervision/assist for financial management;Assist for transportation;Help with stairs or ramp for entrance   Equipment Recommendations  Other (comment) (defer to next venue of care)       Precautions / Restrictions Precautions Precautions:  Fall Required Braces or Orthoses: Sling Restrictions Weight Bearing Restrictions: Yes RUE Weight Bearing: Weight bearing as tolerated LUE Weight Bearing: Non weight bearing LLE Weight Bearing: Weight bearing as tolerated Other Position/Activity Restrictions: Per previous notes, WBAT with CAM boot to L LE       Mobility Bed Mobility Overal bed mobility: Needs Assistance Bed Mobility: Sit to Supine       Sit to supine: Min assist        Transfers Overall transfer level: Needs assistance Equipment used: 1 person hand held assist Transfers: Sit to/from Stand Sit to Stand: Min assist                 Balance Overall balance assessment: Needs assistance Sitting-balance support: No upper extremity supported, Feet supported Sitting balance-Leahy Scale: Good     Standing balance support: During functional activity, Reliant on assistive device for balance Standing balance-Leahy Scale: Poor                             ADL either performed or assessed with clinical judgement     Vision Patient Visual Report: No change from baseline            Cognition Arousal/Alertness: Awake/alert Behavior During Therapy: WFL for tasks assessed/performed Overall Cognitive Status: Within Functional Limits for tasks assessed                                                     Pertinent Vitals/ Pain  Pain Assessment Pain Assessment: No/denies pain         Frequency  Min 2X/week        Progress Toward Goals  OT Goals(current goals can now be found in the care plan section)  Progress towards OT goals: Progressing toward goals  Acute Rehab OT Goals Patient Stated Goal: to not be dizzy  Plan Discharge plan remains appropriate;Frequency remains appropriate       AM-PAC OT "6 Clicks" Daily Activity     Outcome Measure   Help from another person eating meals?: None Help from another person taking care of personal grooming?: A  Little Help from another person toileting, which includes using toliet, bedpan, or urinal?: A Lot Help from another person bathing (including washing, rinsing, drying)?: A Lot Help from another person to put on and taking off regular upper body clothing?: A Little Help from another person to put on and taking off regular lower body clothing?: A Little 6 Click Score: 17    End of Session    OT Visit Diagnosis: Unsteadiness on feet (R26.81);Muscle weakness (generalized) (M62.81);Repeated falls (R29.6)   Activity Tolerance Patient limited by fatigue;Patient limited by pain;Other (comment)   Patient Left in bed;with bed alarm set;with call bell/phone within reach   Nurse Communication Mobility status        Time: [id number] OT Time Calculation (min): 18 min  Charges: OT General Charges $OT Visit: 1 Visit OT Treatments $Therapeutic Activity: 8-22 mins  Darleen Crocker, MS, OTR/L , CBIS ascom [phone]  05/22/22, 10:09 AM

## 2022-05-22 NOTE — Progress Notes (Signed)
Physical Therapy Treatment Patient Details Name: Nicholas Weiss MRN: [id number] DOB: [birth year] Today's Date: 05/22/2022   History of Present Illness presented to ER secondary to recurrent falls (related to hypoglycemia?); admitted for management of SDH (4m, stable) and acute fracture to L humerus/greater tubercle.    PT Comments    Pt was supine in bed upon arriving. He is alert. Author asked if pt ate lunch and he responded with he wasn't hungry and wasn't going to eat. Encouraged eating and drinking. With encouragement, pt agrees to OOB activity. BP in supine: 135/92 Sitting EOB: 98/64 asymptomatic Standing x 2 mins: 79/58 (59) does have symptoms of dizziness Sitting 86/54 Supine 112/72 PT will continue efforts to mobilize pt away form EOB as able as BP allows. SNF currently most appropriate DC but author does feel pt will progress quickly if/once BP is stabilized.   Recommendations for follow up therapy are one component of a multi-disciplinary discharge planning process, led by the attending physician.  Recommendations may be updated based on patient status, additional functional criteria and insurance authorization.  Follow Up Recommendations  Skilled nursing-short term rehab (<3 hours/day) ([facility]     Assistance Recommended at Discharge Frequent or constant Supervision/Assistance  Patient can return home with the following A little help with walking and/or transfers;A little help with bathing/dressing/bathroom   Equipment Recommendations  Other (comment) (defer to next level of care)       Precautions / Restrictions Precautions Precautions: Fall Restrictions Weight Bearing Restrictions: Yes LLE Weight Bearing: Weight bearing as tolerated Other Position/Activity Restrictions: Per previous notes, WBAT with CAM boot to L LE     Mobility  Bed Mobility Overal bed mobility: Needs Assistance Bed Mobility: Supine to  Sit  Supine to sit: Supervision Sit to supine: Min assist   General bed mobility comments: no physical assistance to achieve EOB sitting from supine. sat EOB without symptoms prior to standing. BP in supine prior to OOB activity 135/92(105) , sitting 98/64 (76) no symptoms of dizziness, stood for 2 minutes with dizziness noted 79/58 (69) . pt returned to sitting  with BP 86/54 then after returning to supine with BLE elevated BP 112/72    Transfers Overall transfer level: Needs assistance Equipment used: None Transfers: Sit to/from Stand Sit to Stand: Min guard    General transfer comment: CGA for safety. stood x ~ 2 minutes until he c./o dizziness and required seated rest. Pt encouraged to eat and drink. He was premedicated with midodrine prior to session    Ambulation/Gait    General Gait Details: unsafe due to BP concerns. Author epects pt to be able to ambulate once BP/orthostatic hypotension resolves    Balance Overall balance assessment: Needs assistance Sitting-balance support: No upper extremity supported, Feet supported Sitting balance-Leahy Scale: Good     Standing balance support: No upper extremity supported, During functional activity Standing balance-Leahy Scale: Fair      Cognition Arousal/Alertness: Awake/alert Behavior During Therapy: WFL for tasks assessed/performed Overall Cognitive Status: Within Functional Limits for tasks assessed      General Comments: Pt required encouragement but once agreeable does fully participate               Pertinent Vitals/Pain Pain Assessment Pain Assessment: No/denies pain Pain Score: 0-No pain     PT Goals (current goals can now be found in the care plan section) Acute Rehab PT Goals Patient Stated Goal: none stated Progress  towards PT goals: Progressing toward goals    Frequency    Min 2X/week      PT Plan Current plan remains appropriate    Co-evaluation     PT goals addressed during session:  Mobility/safety with mobility;Other (comment)        AM-PAC PT "6 Clicks" Mobility   Outcome Measure  Help needed turning from your back to your side while in a flat bed without using bedrails?: None Help needed moving from lying on your back to sitting on the side of a flat bed without using bedrails?: A Little Help needed moving to and from a bed to a chair (including a wheelchair)?: A Little Help needed standing up from a chair using your arms (e.g., wheelchair or bedside chair)?: A Little Help needed to walk in hospital room?: A Little Help needed climbing 3-5 steps with a railing? : A Little 6 Click Score: 19    End of Session   Activity Tolerance: Treatment limited secondary to medical complications (Comment) (limited by orthostatic hypotension) Patient left: with call bell/phone within reach;with nursing/sitter in room;in bed;with bed alarm set Nurse Communication: Mobility status PT Visit Diagnosis: Other abnormalities of gait and mobility (R26.89);Unsteadiness on feet (R26.81)     Time: [id number] PT Time Calculation (min) (ACUTE ONLY): 19 min  Charges:  $Therapeutic Activity: 8-22 mins                     Julaine Fusi PTA 05/22/22, 2:28 PM

## 2022-05-22 NOTE — Inpatient Diabetes Management (Signed)
Inpatient Diabetes Program Recommendations  AACE/ADA: New Consensus Statement on Inpatient Glycemic Control (2015)  Target Ranges:  Prepandial:   less than 140 mg/dL      Peak postprandial:   less than 180 mg/dL (1-2 hours)      Critically ill patients:  140 - 180 mg/dL    Latest Reference Range & Units 05/21/22 08:01 05/21/22 11:56 05/21/22 16:03 05/21/22 18:01 05/21/22 20:46  Glucose-Capillary 70 - 99 mg/dL 310 (H)  4 units Novolog  7 units Levemir '@0954'$   261 (H)  3 units Novolog  113 (H) 168 (H) 169 (H)  (H): Data is abnormally high  Latest Reference Range & Units 05/22/22 07:40 05/22/22 11:28  Glucose-Capillary 70 - 99 mg/dL 299 (H)  3 units Novolog  10 units Levemir  260 (H)  3 units Novolog   (H): Data is abnormally high   History: Type 1 diabetes   Home DM Meds:  Tresiba 20 units daily       Novolog 0-9 units TID        Farxiga 10 mg daily        Dexcom G6 CGM   Current Orders: Levemir 10 units daily                           Novolog 0-6 units TID          MD- Note Levemir increased to 10 units Daily this AM  May consider adding low dose Novolog Meal Coverage: Novolog 2 units TID with meals HOLD if pt NPO HOLD if pt eats <50% meals       ENDO: Dr. Gabriel Carina Last Seen 03/16/2022--A1c at that visit was 9.3% Was told to take the following:   Continue Tresiba 15 units every morning   IF EATING Take 3 unit of NovoLog if your sugar is 80 - 120 Take 5 unit of NovoLog if your sugar is 121-170 Take 7 units of NovoLog if your sugar is 171-220 Take 9 units of NovoLog if your sugar is 221-270 Take 10 units of NovoLog if your sugar is 271 or higher  IF NOT EATING (AND YOU HAVE NOT TAKEN NOVOLOG IN THE LAST 3 HOURS) Take 2 units of NovoLog if your sugar is 201-250 Take 4 units of NovoLog if your sugar is 251-300 Take 6 units of NovoLog if your sugar is 301 or higher    --Will follow patient during hospitalization--  Wyn Quaker RN, MSN,  [place] Diabetes Coordinator Inpatient Glycemic Control Team Team Pager: [phone] (8a-5p)

## 2022-05-22 NOTE — Progress Notes (Signed)
PT Cancellation Note  Patient Details Name: Nicholas Weiss MRN: [id number] DOB: 06/12/1950   Cancelled Treatment:     PT attempt. Pt is currently working with OT. Acute PT will continue to follow and progress as able per current POC.    Willette Pa 05/22/2022, 9:48 AM

## 2022-05-22 NOTE — TOC Progression Note (Signed)
Transition of Care [facility]) - Progression Note    Patient Details  Name: Nicholas Weiss MRN: [id number] Date of Birth: 20-Mar-1951  Transition of [facility]) CM/SW Contact  Gerilyn Pilgrim, LCSW Phone Number: 05/22/2022, 2:44 PM  Clinical Narrative:   Pt would like to go to [facility] in [place] at discharge. CSW will start auth once medically ready.         Expected Discharge Plan and Services                                               Social Determinants of Health (SDOH) Interventions SDOH Screenings   Food Insecurity: No Food Insecurity (05/15/2022)  Housing: Low Risk  (05/15/2022)  Transportation Needs: No Transportation Needs (05/15/2022)  Utilities: Not At Risk (05/15/2022)  Alcohol Screen: Low Risk  (10/02/2020)  Depression (PHQ2-9): Medium Risk (10/02/2020)  Financial Resource Strain: Low Risk  (01/01/2020)  Physical Activity: Insufficiently Active (01/01/2020)  Social Connections: Moderately Isolated (01/01/2020)  Stress: No Stress Concern Present (01/01/2020)  Tobacco Use: Low Risk  (05/15/2022)    Readmission Risk Interventions    05/20/2022   11:41 AM 03/12/2022   10:22 AM 02/25/2022    2:43 PM  Readmission Risk Prevention Plan  Transportation Screening Complete Complete Complete  Medication Review Press photographer) Complete Complete Complete  PCP or Specialist appointment within 3-5 days of discharge Complete Complete Complete  HRI or Home Care Consult Complete Complete Complete  SW Recovery Care/Counseling Consult  Complete   Palliative Care Screening Complete Not Applicable Not [place] Complete Not Applicable Not Applicable

## 2022-05-23 DIAGNOSIS — I10 Essential (primary) hypertension: Secondary | ICD-10-CM | POA: Diagnosis not present

## 2022-05-23 DIAGNOSIS — W19XXXD Unspecified fall, subsequent encounter: Secondary | ICD-10-CM | POA: Diagnosis not present

## 2022-05-23 LAB — GLUCOSE, CAPILLARY
Glucose-Capillary: 521 mg/dL (ref 70–99)
Glucose-Capillary: 538 mg/dL (ref 70–99)
Glucose-Capillary: 486 mg/dL — ABNORMAL HIGH (ref 70–99)
Glucose-Capillary: 323 mg/dL — ABNORMAL HIGH (ref 70–99)
Glucose-Capillary: 114 mg/dL — ABNORMAL HIGH (ref 70–99)
Glucose-Capillary: 71 mg/dL (ref 70–99)

## 2022-05-23 MED ORDER — INSULIN ASPART 100 UNIT/ML IJ SOLN
6.0000 [IU] | Freq: Once | INTRAMUSCULAR | Status: AC
  Administered 2022-05-23: 6 [IU] via SUBCUTANEOUS
  Filled 2022-05-23: qty 1

## 2022-05-23 NOTE — Progress Notes (Signed)
PROGRESS NOTE    Nicholas Weiss  [id number] DOB: [birth year] DOA: 05/14/2022 PCP: Dion Body, MD    Brief Narrative:  72 y.o. man from community with past medical history of diabetes melitis type I, hypoglycemia, hypertension, DKA and history of multiple falls who presented to the ED with complaints of fall.  Patient had multiple admissions for DKA in the past.  Was found to be in hypertensive urgency on presentation.  Patient was evaluated with CT scan of the head which showed subdural hematoma 7 mm in size.  Neurosurgery was consulted Dr Cari Caraway, patient was started on keppra for prophylaxis bid 500 mg bid x 7 days.  At the time of my evaluation patient was back to his baseline, confused.  No complaint of headache, blurring of vision, chest pain, palpitation, shortness of breath or abdominal pain.  No focal weakness.  No loss of consciousness no bowel bladder incontinence.   Patient was admitted to stepdown unit for maintenance of blood pressure in the setting of subdural hematoma.  Patient was kept on nicardipine drip.  Which was discontinued on 2/17 as blood pressure was in stable range.  Patient however developed hyperkalemia with potassium of 5.6 and hyponatremia 131.  Started patient on IV normal saline for worsening renal function with creatinine of 1.32.   2/17 : Patient seen and examined this morning.  No overnight events.  Back to baseline.  Had an episode of hypoglycemia with blood glucose of 20.  Repleted with improvement in mental status.  Continued with D10 half-normal saline for now.  Had hyperkalemia with potassium of 5.6 ordered Lokelma.  Insulin dose decreased to 6 units nightly with corrective scale with meals.   2/18 : Patient seen and examined this morning no overnight events.  Had early morning hypoglycemia managed with hypoglycemia protocol.  Patient morning glucose in the range of 120s to 150s.  Patient did not receive his Lantus last night.  Reports that he  takes [name] 15 units in the morning daily with no issues.  However patient has been having multiple falls likely secondary to hypoglycemia will need adjustment of insulin and endocrinology follow-up in the outpatient setting.  Patient presenting symptoms have resolved however needs optimization of insulin prior to discharge.   2/19: Patient is seen and examined at the bedside.  He is awake and alert and is oriented to person and place but not to time.   2/20 : Patient is seen and examined at bedside.  Eating breakfast and appears comfortable and in no distress.  2/21: Remains orthostatic and symptomatic 2/22: Remains orthostatic.  Less so than yesterday. 2/23: Remains orthostatic 2/24: Still symptomatic, dizzy when standing.  Insurance authorization iniated  Assessment & Plan:   Principal Problem:   Falls Active Problems:   Acute renal failure superimposed on stage 3a chronic kidney disease (HCC)   Essential (primary) hypertension   Cocaine abuse ([place])   History of substance abuse ([place])   Hypertensive urgency   Anemia of chronic kidney failure, stage 3 (moderate) (HCC)   Chronic hyponatremia   Benign prostatic hyperplasia with nocturia   Subdural hematoma (HCC)   Shoulder pain, left  Falls Orthostatic hypotension Pt sustained head injury and ct showed SDH. IMPRESSION: 1. Small right frontal subdural hematoma measuring 7 mm in thickness with mild mass effect upon the subjacent right frontal cortical gyri. No midline shift. 2. Stable mild  Evaluated by PT and noted to have orthostatic blood pressure changes which could have contributed to his frequent  falls Plan: Orthostatic hypotension persists.  Continue midodrine to 10 mg 3 times daily.  Patient agreeable to skilled nursing facility. Ins auth initiated   Hyperkalemia  Improved   Diabetes mellitus type 1  Patient is on oral hypoglycemic agent along with [name].   On admission patient was placed on corrective scale insulin  along with Lantus 7 units.    Has had issues with hypoglycemia and labile sugars Sugar very elevated on 2/24.  Per RN patient was found with an empty large bag of Cheetos by bedside. Plan: Continue Semglee 10 units daily Very sensitive sliding scale No nightly coverage Educated on dietary discretion   Acute renal failure superimposed on stage 3a chronic kidney disease (HCC) Renal function has improved and is back to baseline CK levels are within normal limits   Cocaine abuse  High risk for IC bleed.  Patient has been counseled on the need to abstain from further cocaine use   Hypertensive urgency Pt started on nicardipine drip for hypertensive urgency.  Has been off since 02/17 Noted to have orthostatic blood pressure changes and started on midodrine BP reasonably well-controlled   Shoulder pain, left Cont with prn pain meds and fall precaution. Attribute to fall.    Subdural hematoma  Continue keppra per neurosurgery recommendation to complete a 7-day course of therapy     Chronic hyponatremia Stable and mild.   Anemia of chronic kidney failure, stage 3 (moderate)  Attribute history of multiple myeloma (no ER evaluation). Transfuse as needed       Osteomyelitis Status post recent amputation of left great toe and left partial second toe amputation Continue Augmentin to complete the course    DVT prophylaxis: SCD Code Status: Full Family Communication: None Disposition Plan: Status is: Inpatient Remains inpatient appropriate because: Symptomatic orthostatic hypotension   Level of care: Telemetry Medical  Consultants:  None  Procedures:  None  Antimicrobials: None   Subjective: Seen and examined.  Resting in bed.  Poor historian.  Endorses dizziness.  Objective: Vitals:   05/23/22 0439 05/23/22 0500 05/23/22 0826 05/23/22 0827  BP: 114/84  (!) 83/65 90/64  Pulse: 92  (!) 102 100  Resp: '20  16 16  '$ Temp: 98.1 F (36.7 C)  98.2 F (36.8 C) 98.2 F  (36.8 C)  TempSrc: Oral     SpO2: 100%  99% 100%  Weight:  64.8 kg    Height:        Intake/Output Summary (Last 24 hours) at 05/23/2022 1059 Last data filed at 05/23/2022 0836 Gross per 24 hour  Intake --  Output 1450 ml  Net -1450 ml   Filed Weights   05/21/22 0500 05/22/22 0500 05/23/22 0500  Weight: 67.5 kg 65.7 kg 64.8 kg    Examination:  General exam: NAD Respiratory system: Lungs clear.  Normal work of breathing.  Room air Cardiovascular system: S1-S2, RRR, no murmurs, no pedal edema Gastrointestinal system: Soft, NT/ND, normal bowel sounds Central nervous system: Alert and oriented. No focal neurological deficits. Extremities: Bilateral lower extremity weakness, left lower extremity in surgical boot Skin: No rashes, lesions or ulcers Psychiatry: Judgement and insight appear normal. Mood & affect flattened.     Data Reviewed: I have personally reviewed following labs and imaging studies  CBC: Recent Labs  Lab 05/17/22 0757 05/20/22 0411 05/22/22 0726  WBC 3.7* 4.0 2.7*  NEUTROABS  --   --  1.2*  HGB 7.6* 8.4* 8.0*  HCT 23.4* 26.9* 24.8*  MCV 91.8 92.4 91.5  PLT 406* 357 [id number]   Basic Metabolic Panel: Recent Labs  Lab 05/17/22 0757 05/19/22 0930 05/22/22 0726  NA 133* 134* 131*  K 3.9 3.7 4.5  CL 103 108 102  CO2 23 21* 22  GLUCOSE 51* 223* 305*  BUN 39* 30* 30*  CREATININE 1.83* 1.15 1.12  CALCIUM 8.5* 8.1* 8.2*  MG  --  1.9  --    GFR: Estimated Creatinine Clearance: 57.1 mL/min (by C-G formula based on SCr of 1.12 mg/dL). Liver Function Tests: No results for input(s): "AST", "ALT", "ALKPHOS", "BILITOT", "PROT", "ALBUMIN" in the last 168 hours.  No results for input(s): "LIPASE", "AMYLASE" in the last 168 hours. No results for input(s): "AMMONIA" in the last 168 hours. Coagulation Profile: No results for input(s): "INR", "PROTIME" in the last 168 hours. Cardiac Enzymes: Recent Labs  Lab 05/18/22 1722  CKTOTAL 36*   BNP (last 3  results) No results for input(s): "PROBNP" in the last 8760 hours. HbA1C: No results for input(s): "HGBA1C" in the last 72 hours.  CBG: Recent Labs  Lab 05/22/22 1608 05/22/22 2101 05/23/22 0826 05/23/22 0828 05/23/22 0945  GLUCAP 109* 237* 538* 521* 486*   Lipid Profile: No results for input(s): "CHOL", "HDL", "LDLCALC", "TRIG", "CHOLHDL", "LDLDIRECT" in the last 72 hours. Thyroid Function Tests: No results for input(s): "TSH", "T4TOTAL", "FREET4", "T3FREE", "THYROIDAB" in the last 72 hours. Anemia Panel: No results for input(s): "VITAMINB12", "FOLATE", "FERRITIN", "TIBC", "IRON", "RETICCTPCT" in the last 72 hours.  Sepsis Labs: No results for input(s): "PROCALCITON", "LATICACIDVEN" in the last 168 hours.  No results found for this or any previous visit (from the past 240 hour(s)).       Radiology Studies: No results found.      Scheduled Meds:  amoxicillin-clavulanate  1 tablet Oral Q12H   ascorbic acid  500 mg Oral Daily   insulin aspart  0-6 Units Subcutaneous TID WC   insulin detemir  10 Units Subcutaneous Daily   insulin detemir  3 Units Subcutaneous Once   midodrine  10 mg Oral TID WC   pantoprazole  40 mg Oral BID   sodium chloride flush  3 mL Intravenous Q12H   thiamine  100 mg Oral Daily   Continuous Infusions:  sodium chloride Stopped (05/16/22 1457)     LOS: 8 days       Sidney Ace, MD [facility]   If 7PM-7AM, please contact night-coverage  05/23/2022, 10:59 AM

## 2022-05-23 NOTE — TOC Progression Note (Addendum)
Transition of Care [facility]) - Progression Note    Patient Details  Name: ARLEE SILVESTRO MRN: [id number] Date of Birth: [birth year]  [facility]) CM/SW Contact  Valente David, RN Phone Number: 05/23/2022, 1:20 PM  Clinical Narrative:      Josem Kaufmann started for pending discharge to [facility] on Monday. Blaine Hamper ID ID:[id number].      Expected Discharge Plan and Services                                               Social Determinants of Health (SDOH) Interventions SDOH Screenings   Food Insecurity: No Food Insecurity (05/15/2022)  Housing: Low Risk  (05/15/2022)  Transportation Needs: No Transportation Needs (05/15/2022)  Utilities: Not At Risk (05/15/2022)  Alcohol Screen: Low Risk  (10/02/2020)  Depression (PHQ2-9): Medium Risk (10/02/2020)  Financial Resource Strain: Low Risk  (01/01/2020)  Physical Activity: Insufficiently Active (01/01/2020)  Social Connections: Moderately Isolated (01/01/2020)  Stress: No Stress Concern Present (01/01/2020)  Tobacco Use: Low Risk  (05/15/2022)    Readmission Risk Interventions    05/20/2022   11:41 AM 03/12/2022   10:22 AM 02/25/2022    2:43 PM  Readmission Risk Prevention Plan  Transportation Screening Complete Complete Complete  Medication Review Press photographer) Complete Complete Complete  PCP or Specialist appointment within 3-5 days of discharge Complete Complete Complete  HRI or Home Care Consult Complete Complete Complete  SW Recovery Care/Counseling Consult  Complete   Palliative Care Screening Complete Not Applicable Not [place] Complete Not Applicable Not Applicable

## 2022-05-23 NOTE — Progress Notes (Signed)
Patient's fingerstick is 538, repeat is 521, sliding scale orders indicate to give 6 units insulin,Dr. S. Sreenath notified.  Per physician, give the 6 unites, MD to address further.

## 2022-05-24 DIAGNOSIS — W19XXXD Unspecified fall, subsequent encounter: Secondary | ICD-10-CM | POA: Diagnosis not present

## 2022-05-24 DIAGNOSIS — I10 Essential (primary) hypertension: Secondary | ICD-10-CM | POA: Diagnosis not present

## 2022-05-24 LAB — BASIC METABOLIC PANEL
Anion gap: 7 (ref 5–15)
BUN: 35 mg/dL — ABNORMAL HIGH (ref 8–23)
CO2: 25 mmol/L (ref 22–32)
Calcium: 8.8 mg/dL — ABNORMAL LOW (ref 8.9–10.3)
Chloride: 101 mmol/L (ref 98–111)
Creatinine, Ser: 1.45 mg/dL — ABNORMAL HIGH (ref 0.61–1.24)
GFR, Estimated: 52 mL/min — ABNORMAL LOW (ref >60)
Glucose, Bld: 213 mg/dL — ABNORMAL HIGH (ref 70–99)
Potassium: 4.9 mmol/L (ref 3.5–5.1)
Sodium: 133 mmol/L — ABNORMAL LOW (ref 135–145)

## 2022-05-24 LAB — CBC WITH DIFFERENTIAL/PLATELET
Abs Immature Granulocytes: 0.01 10*3/uL (ref 0.00–0.07)
Basophils Absolute: 0 10*3/uL (ref 0.0–0.1)
Basophils Relative: 1 %
Eosinophils Absolute: 0.1 10*3/uL (ref 0.0–0.5)
Eosinophils Relative: 2 %
HCT: 25.7 % — ABNORMAL LOW (ref 39.0–52.0)
Hemoglobin: 8.3 g/dL — ABNORMAL LOW (ref 13.0–17.0)
Immature Granulocytes: 0 %
Lymphocytes Relative: 41 %
Lymphs Abs: 1.2 10*3/uL (ref 0.7–4.0)
MCH: 29.7 pg (ref 26.0–34.0)
MCHC: 32.3 g/dL (ref 30.0–36.0)
MCV: 92.1 fL (ref 80.0–100.0)
Monocytes Absolute: 0.3 10*3/uL (ref 0.1–1.0)
Monocytes Relative: 10 %
Neutro Abs: 1.4 10*3/uL — ABNORMAL LOW (ref 1.7–7.7)
Neutrophils Relative %: 46 %
Platelets: 361 10*3/uL (ref 150–400)
RBC: 2.79 MIL/uL — ABNORMAL LOW (ref 4.22–5.81)
RDW: 17.2 % — ABNORMAL HIGH (ref 11.5–15.5)
WBC: 3 10*3/uL — ABNORMAL LOW (ref 4.0–10.5)
nRBC: 0 % (ref 0.0–0.2)

## 2022-05-24 LAB — GLUCOSE, CAPILLARY
Glucose-Capillary: 195 mg/dL — ABNORMAL HIGH (ref 70–99)
Glucose-Capillary: 282 mg/dL — ABNORMAL HIGH (ref 70–99)
Glucose-Capillary: 199 mg/dL — ABNORMAL HIGH (ref 70–99)
Glucose-Capillary: 227 mg/dL — ABNORMAL HIGH (ref 70–99)

## 2022-05-24 NOTE — Progress Notes (Signed)
PROGRESS NOTE    Nicholas Weiss  [id number] DOB: 03/24/1951 DOA: 05/14/2022 PCP: Dion Body, MD    Brief Narrative:  72 y.o. man from community with past medical history of diabetes melitis type I, hypoglycemia, hypertension, DKA and history of multiple falls who presented to the ED with complaints of fall.  Patient had multiple admissions for DKA in the past.  Was found to be in hypertensive urgency on presentation.  Patient was evaluated with CT scan of the head which showed subdural hematoma 7 mm in size.  Neurosurgery was consulted Dr Cari Caraway, patient was started on keppra for prophylaxis bid 500 mg bid x 7 days.  At the time of my evaluation patient was back to his baseline, confused.  No complaint of headache, blurring of vision, chest pain, palpitation, shortness of breath or abdominal pain.  No focal weakness.  No loss of consciousness no bowel bladder incontinence.   Patient was admitted to stepdown unit for maintenance of blood pressure in the setting of subdural hematoma.  Patient was kept on nicardipine drip.  Which was discontinued on 2/17 as blood pressure was in stable range.  Patient however developed hyperkalemia with potassium of 5.6 and hyponatremia 131.  Started patient on IV normal saline for worsening renal function with creatinine of 1.32.   2/17 : Patient seen and examined this morning.  No overnight events.  Back to baseline.  Had an episode of hypoglycemia with blood glucose of 20.  Repleted with improvement in mental status.  Continued with D10 half-normal saline for now.  Had hyperkalemia with potassium of 5.6 ordered Lokelma.  Insulin dose decreased to 6 units nightly with corrective scale with meals.   2/18 : Patient seen and examined this morning no overnight events.  Had early morning hypoglycemia managed with hypoglycemia protocol.  Patient morning glucose in the range of 120s to 150s.  Patient did not receive his Lantus last night.  Reports that he  takes [name] 15 units in the morning daily with no issues.  However patient has been having multiple falls likely secondary to hypoglycemia will need adjustment of insulin and endocrinology follow-up in the outpatient setting.  Patient presenting symptoms have resolved however needs optimization of insulin prior to discharge.   2/19: Patient is seen and examined at the bedside.  He is awake and alert and is oriented to person and place but not to time.   2/20 : Patient is seen and examined at bedside.  Eating breakfast and appears comfortable and in no distress.  2/21: Remains orthostatic and symptomatic 2/22: Remains orthostatic.  Less so than yesterday. 2/23: Remains orthostatic 2/24: Still symptomatic, dizzy when standing.  Insurance authorization iniated 2/25: Hypotension yesterday  Assessment & Plan:   Principal Problem:   Falls Active Problems:   Acute renal failure superimposed on stage 3a chronic kidney disease (HCC)   Essential (primary) hypertension   Cocaine abuse ([place])   History of substance abuse ([place])   Hypertensive urgency   Anemia of chronic kidney failure, stage 3 (moderate) (HCC)   Chronic hyponatremia   Benign prostatic hyperplasia with nocturia   Subdural hematoma (HCC)   Shoulder pain, left  Falls Orthostatic hypotension Pt sustained head injury and ct showed SDH. IMPRESSION: 1. Small right frontal subdural hematoma measuring 7 mm in thickness with mild mass effect upon the subjacent right frontal cortical gyri. No midline shift. 2. Stable mild  Evaluated by PT and noted to have orthostatic blood pressure changes which could have contributed  to his frequent falls Plan: Orthostatic hypotension persists.  Continue midodrine to 10 mg 3 times daily.  Patient agreeable to skilled nursing facility. Ins auth initiated.  Likely needs rehab/vestibular therapy   Hyperkalemia  Improved   Diabetes mellitus type 1  Patient is on oral hypoglycemic agent along with  [name].   On admission patient was placed on corrective scale insulin along with Lantus 7 units.    Has had issues with hypoglycemia and labile sugars Sugar very elevated on 2/24.  Per RN patient was found with an empty large bag of Cheetos by bedside. Plan: Continue Semglee 10 units daily Very sensitive sliding scale No nightly coverage Educated on dietary discretion   Acute renal failure superimposed on stage 3a chronic kidney disease (HCC) Renal function has improved and is back to baseline CK levels are within normal limits   Cocaine abuse  High risk for IC bleed.  Patient has been counseled on the need to abstain from further cocaine use   Hypertensive urgency Pt started on nicardipine drip for hypertensive urgency.  Has been off since 02/17 Noted to have orthostatic blood pressure changes and started on midodrine BP reasonably well-controlled   Shoulder pain, left Cont with prn pain meds and fall precaution. Attribute to fall.    Subdural hematoma  Continue keppra per neurosurgery recommendation to complete a 7-day course of therapy     Chronic hyponatremia Stable and mild.   Anemia of chronic kidney failure, stage 3 (moderate)  Attribute history of multiple myeloma (no ER evaluation). Transfuse as needed       Osteomyelitis Status post recent amputation of left great toe and left partial second toe amputation Continue Augmentin to complete the course    DVT prophylaxis: SCD Code Status: Full Family Communication: None Disposition Plan: Status is: Inpatient Remains inpatient appropriate because: Symptomatic orthostatic hypotension   Level of care: Telemetry Medical  Consultants:  None  Procedures:  None  Antimicrobials: None   Subjective: Seen and examined.  Resting in bed.  Poor historian.  No complaints this AM  Objective: Vitals:   05/23/22 2128 05/24/22 0436 05/24/22 0600 05/24/22 0833  BP: (!) 142/82 (!) 144/89  (!) 143/93  Pulse: 68  73  70  Resp: '19 20  20  '$ Temp: 98.3 F (36.8 C) 98.6 F (37 C)  98.1 F (36.7 C)  TempSrc:      SpO2: 100% 100%  100%  Weight:   64 kg   Height:        Intake/Output Summary (Last 24 hours) at 05/24/2022 0933 Last data filed at 05/24/2022 0145 Gross per 24 hour  Intake 480 ml  Output 725 ml  Net -245 ml   Filed Weights   05/22/22 0500 05/23/22 0500 05/24/22 0600  Weight: 65.7 kg 64.8 kg 64 kg    Examination:  General exam: No acute distress Respiratory system: Lungs clear.  Normal work of breathing.  Room air Cardiovascular system: S1-S2, RRR, no murmurs, no pedal edema Gastrointestinal system: Soft, NT/ND, normal bowel sounds Central nervous system: Alert and oriented. No focal neurological deficits. Extremities: Bilateral lower extremity weakness, left lower extremity in surgical boot Skin: No rashes, lesions or ulcers Psychiatry: Judgement and insight appear normal. Mood & affect flattened.     Data Reviewed: I have personally reviewed following labs and imaging studies  CBC: Recent Labs  Lab 05/20/22 0411 05/22/22 0726 05/24/22 0817  WBC 4.0 2.7* 3.0*  NEUTROABS  --  1.2* 1.4*  HGB 8.4* 8.0*  8.3*  HCT 26.9* 24.8* 25.7*  MCV 92.4 91.5 92.1  PLT 357 355 [id number]   Basic Metabolic Panel: Recent Labs  Lab 05/19/22 0930 05/22/22 0726 05/24/22 0817  NA 134* 131* 133*  K 3.7 4.5 4.9  CL 108 102 101  CO2 21* 22 25  GLUCOSE 223* 305* 213*  BUN 30* 30* 35*  CREATININE 1.15 1.12 1.45*  CALCIUM 8.1* 8.2* 8.8*  MG 1.9  --   --    GFR: Estimated Creatinine Clearance: 43.5 mL/min (A) (by C-G formula based on SCr of 1.45 mg/dL (H)). Liver Function Tests: No results for input(s): "AST", "ALT", "ALKPHOS", "BILITOT", "PROT", "ALBUMIN" in the last 168 hours.  No results for input(s): "LIPASE", "AMYLASE" in the last 168 hours. No results for input(s): "AMMONIA" in the last 168 hours. Coagulation Profile: No results for input(s): "INR", "PROTIME" in the last 168  hours. Cardiac Enzymes: Recent Labs  Lab 05/18/22 1722  CKTOTAL 36*   BNP (last 3 results) No results for input(s): "PROBNP" in the last 8760 hours. HbA1C: No results for input(s): "HGBA1C" in the last 72 hours.  CBG: Recent Labs  Lab 05/23/22 0945 05/23/22 1205 05/23/22 1613 05/23/22 2139 05/24/22 0822  GLUCAP 486* 323* 114* 71 195*   Lipid Profile: No results for input(s): "CHOL", "HDL", "LDLCALC", "TRIG", "CHOLHDL", "LDLDIRECT" in the last 72 hours. Thyroid Function Tests: No results for input(s): "TSH", "T4TOTAL", "FREET4", "T3FREE", "THYROIDAB" in the last 72 hours. Anemia Panel: No results for input(s): "VITAMINB12", "FOLATE", "FERRITIN", "TIBC", "IRON", "RETICCTPCT" in the last 72 hours.  Sepsis Labs: No results for input(s): "PROCALCITON", "LATICACIDVEN" in the last 168 hours.  No results found for this or any previous visit (from the past 240 hour(s)).       Radiology Studies: No results found.      Scheduled Meds:  ascorbic acid  500 mg Oral Daily   insulin aspart  0-6 Units Subcutaneous TID WC   insulin detemir  10 Units Subcutaneous Daily   insulin detemir  3 Units Subcutaneous Once   midodrine  10 mg Oral TID WC   pantoprazole  40 mg Oral BID   sodium chloride flush  3 mL Intravenous Q12H   thiamine  100 mg Oral Daily   Continuous Infusions:  sodium chloride Stopped (05/16/22 1457)     LOS: 9 days       Sidney Ace, MD [facility]   If 7PM-7AM, please contact night-coverage  05/24/2022, 9:33 AM

## 2022-05-24 NOTE — TOC Progression Note (Signed)
Transition of Care [facility]) - Progression Note    Patient Details  Name: Nicholas Weiss MRN: [id number] Date of Birth: 1950-05-09  Transition of Care [facility]) CM/SW [place], RN Phone Number: 05/24/2022, 10:08 AM  Clinical Narrative:     Josem Kaufmann remains pending.         Expected Discharge Plan and Services                                               Social Determinants of Health (SDOH) Interventions SDOH Screenings   Food Insecurity: No Food Insecurity (05/15/2022)  Housing: Low Risk  (05/15/2022)  Transportation Needs: No Transportation Needs (05/15/2022)  Utilities: Not At Risk (05/15/2022)  Alcohol Screen: Low Risk  (10/02/2020)  Depression (PHQ2-9): Medium Risk (10/02/2020)  Financial Resource Strain: Low Risk  (01/01/2020)  Physical Activity: Insufficiently Active (01/01/2020)  Social Connections: Moderately Isolated (01/01/2020)  Stress: No Stress Concern Present (01/01/2020)  Tobacco Use: Low Risk  (05/15/2022)    Readmission Risk Interventions    05/20/2022   11:41 AM 03/12/2022   10:22 AM 02/25/2022    2:43 PM  Readmission Risk Prevention Plan  Transportation Screening Complete Complete Complete  Medication Review Press photographer) Complete Complete Complete  PCP or Specialist appointment within 3-5 days of discharge Complete Complete Complete  HRI or Home Care Consult Complete Complete Complete  SW Recovery Care/Counseling Consult  Complete   Palliative Care Screening Complete Not Applicable Not [place] Complete Not Applicable Not Applicable

## 2022-05-25 ENCOUNTER — Other Ambulatory Visit: Payer: 59

## 2022-05-25 ENCOUNTER — Ambulatory Visit: Payer: 59

## 2022-05-25 ENCOUNTER — Ambulatory Visit: Payer: 59 | Admitting: Oncology

## 2022-05-25 DIAGNOSIS — W19XXXD Unspecified fall, subsequent encounter: Secondary | ICD-10-CM | POA: Diagnosis not present

## 2022-05-25 DIAGNOSIS — I10 Essential (primary) hypertension: Secondary | ICD-10-CM | POA: Diagnosis not present

## 2022-05-25 LAB — GLUCOSE, CAPILLARY
Glucose-Capillary: 274 mg/dL — ABNORMAL HIGH (ref 70–99)
Glucose-Capillary: 272 mg/dL — ABNORMAL HIGH (ref 70–99)
Glucose-Capillary: 125 mg/dL — ABNORMAL HIGH (ref 70–99)
Glucose-Capillary: 42 mg/dL — CL (ref 70–99)
Glucose-Capillary: 144 mg/dL — ABNORMAL HIGH (ref 70–99)

## 2022-05-25 MED ORDER — FLUDROCORTISONE ACETATE 0.1 MG PO TABS
0.1000 mg | ORAL_TABLET | Freq: Every day | ORAL | Status: DC
  Administered 2022-05-25 – 2022-05-27 (×3): 0.1 mg via ORAL
  Filled 2022-05-25 (×4): qty 1

## 2022-05-25 NOTE — Progress Notes (Signed)
Pt's blood glucose is 42. Pt is asymptomatic. Given OJ,Peanut butter and Graham crackers. Glucose to be rechecked

## 2022-05-25 NOTE — Progress Notes (Signed)
PROGRESS NOTE    Nicholas Weiss  [id number] DOB: May 01, 1950 DOA: 05/14/2022 PCP: Dion Body, MD    Brief Narrative:  72 y.o. man from community with past medical history of diabetes melitis type I, hypoglycemia, hypertension, DKA and history of multiple falls who presented to the ED with complaints of fall.  Patient had multiple admissions for DKA in the past.  Was found to be in hypertensive urgency on presentation.  Patient was evaluated with CT scan of the head which showed subdural hematoma 7 mm in size.  Neurosurgery was consulted Dr Cari Caraway, patient was started on keppra for prophylaxis bid 500 mg bid x 7 days.  At the time of my evaluation patient was back to his baseline, confused.  No complaint of headache, blurring of vision, chest pain, palpitation, shortness of breath or abdominal pain.  No focal weakness.  No loss of consciousness no bowel bladder incontinence.   Patient was admitted to stepdown unit for maintenance of blood pressure in the setting of subdural hematoma.  Patient was kept on nicardipine drip.  Which was discontinued on 2/17 as blood pressure was in stable range.  Patient however developed hyperkalemia with potassium of 5.6 and hyponatremia 131.  Started patient on IV normal saline for worsening renal function with creatinine of 1.32.   2/17 : Patient seen and examined this morning.  No overnight events.  Back to baseline.  Had an episode of hypoglycemia with blood glucose of 20.  Repleted with improvement in mental status.  Continued with D10 half-normal saline for now.  Had hyperkalemia with potassium of 5.6 ordered Lokelma.  Insulin dose decreased to 6 units nightly with corrective scale with meals.   2/18 : Patient seen and examined this morning no overnight events.  Had early morning hypoglycemia managed with hypoglycemia protocol.  Patient morning glucose in the range of 120s to 150s.  Patient did not receive his Lantus last night.  Reports that he  takes [name] 15 units in the morning daily with no issues.  However patient has been having multiple falls likely secondary to hypoglycemia will need adjustment of insulin and endocrinology follow-up in the outpatient setting.  Patient presenting symptoms have resolved however needs optimization of insulin prior to discharge.   2/19: Patient is seen and examined at the bedside.  He is awake and alert and is oriented to person and place but not to time.   2/20 : Patient is seen and examined at bedside.  Eating breakfast and appears comfortable and in no distress.  2/21: Remains orthostatic and symptomatic 2/22: Remains orthostatic.  Less so than yesterday. 2/23: Remains orthostatic 2/24: Still symptomatic, dizzy when standing.  Insurance authorization iniated 2/25: Hypotension yesterday 2/26: Remains orthostatic so symptomatically improved  Assessment & Plan:   Principal Problem:   Falls Active Problems:   Acute renal failure superimposed on stage 3a chronic kidney disease (HCC)   Essential (primary) hypertension   Cocaine abuse ([place])   History of substance abuse ([place])   Hypertensive urgency   Anemia of chronic kidney failure, stage 3 (moderate) (HCC)   Chronic hyponatremia   Benign prostatic hyperplasia with nocturia   Subdural hematoma (HCC)   Shoulder pain, left  Falls Orthostatic hypotension Pt sustained head injury and ct showed SDH. IMPRESSION: 1. Small right frontal subdural hematoma measuring 7 mm in thickness with mild mass effect upon the subjacent right frontal cortical gyri. No midline shift. 2. Stable mild  Evaluated by PT and noted to have orthostatic blood  pressure changes which could have contributed to his frequent falls Plan: Orthostatic hypotension persists.  Continue midodrine to 10 mg 3 times daily.  Add Florinef 20 mg daily.  Abdominal binder/compression stockings when ambulating.  Will likely need vestibular therapy.  Will plan to monitor in house and  discharged to skilled nursing facility 2/27   Hyperkalemia  Improved   Diabetes mellitus type 1  Patient is on oral hypoglycemic agent along with [name].   On admission patient was placed on corrective scale insulin along with Lantus 7 units.    Has had issues with hypoglycemia and labile sugars Sugar very elevated on 2/24.  Per RN patient was found with an empty large bag of Cheetos by bedside. Plan: Continue Semglee 10 units daily Very sensitive sliding scale No nightly coverage Educated on dietary discretion   Acute renal failure superimposed on stage 3a chronic kidney disease (HCC) Renal function has improved and is back to baseline CK levels are within normal limits   Cocaine abuse  High risk for IC bleed.  Patient has been counseled on the need to abstain from further cocaine use   Hypertensive urgency Pt started on nicardipine drip for hypertensive urgency.  Has been off since 02/17 Noted to have orthostatic blood pressure changes and started on midodrine BP reasonably well-controlled   Shoulder pain, left Cont with prn pain meds and fall precaution. Attribute to fall.    Subdural hematoma  Continue keppra per neurosurgery recommendation to complete a 7-day course of therapy     Chronic hyponatremia Stable and mild.   Anemia of chronic kidney failure, stage 3 (moderate)  Attribute history of multiple myeloma (no ER evaluation). Transfuse as needed       Osteomyelitis Status post recent amputation of left great toe and left partial second toe amputation Continue Augmentin to complete the course    DVT prophylaxis: SCD Code Status: Full Family Communication: None Disposition Plan: Status is: Inpatient Remains inpatient appropriate because: Symptomatic orthostatic hypotension   Level of care: Telemetry Medical  Consultants:  None  Procedures:  None  Antimicrobials: None   Subjective: Seen and examined.  Resting in bed.  Appears  symptomatically improved this morning.  Energy level improved  Objective: Vitals:   05/25/22 0549 05/25/22 0854 05/25/22 1020 05/25/22 1042  BP: 130/78 107/76 (!) 141/93 (!) 141/93  Pulse: 72 85 80 80  Resp: '19 16 18 16  '$ Temp: 98.6 F (37 C) 97.6 F (36.4 C) 97.8 F (36.6 C) 97.8 F (36.6 C)  TempSrc: Oral Oral Oral   SpO2: 100% 100% 100% 100%  Weight:      Height:        Intake/Output Summary (Last 24 hours) at 05/25/2022 1408 Last data filed at 05/25/2022 0900 Gross per 24 hour  Intake --  Output 3250 ml  Net -3250 ml   Filed Weights   05/23/22 0500 05/24/22 0600 05/25/22 0233  Weight: 64.8 kg 64 kg 67.2 kg    Examination:  General exam: NAD Respiratory system: Lungs clear.  Normal work of breathing.  Room air Cardiovascular system: S1-S2, RRR, no murmurs, no pedal edema Gastrointestinal system: Soft, NT/ND, normal bowel sounds Central nervous system: Alert and oriented. No focal neurological deficits. Extremities: Bilateral lower extremity weakness, left lower extremity in surgical boot Skin: No rashes, lesions or ulcers Psychiatry: Judgement and insight appear normal. Mood & affect flattened.     Data Reviewed: I have personally reviewed following labs and imaging studies  CBC: Recent Labs  Lab 05/20/22 0411 05/22/22 0726 05/24/22 0817  WBC 4.0 2.7* 3.0*  NEUTROABS  --  1.2* 1.4*  HGB 8.4* 8.0* 8.3*  HCT 26.9* 24.8* 25.7*  MCV 92.4 91.5 92.1  PLT 357 355 [id number]   Basic Metabolic Panel: Recent Labs  Lab 05/19/22 0930 05/22/22 0726 05/24/22 0817  NA 134* 131* 133*  K 3.7 4.5 4.9  CL 108 102 101  CO2 21* 22 25  GLUCOSE 223* 305* 213*  BUN 30* 30* 35*  CREATININE 1.15 1.12 1.45*  CALCIUM 8.1* 8.2* 8.8*  MG 1.9  --   --    GFR: Estimated Creatinine Clearance: 45.7 mL/min (A) (by C-G formula based on SCr of 1.45 mg/dL (H)). Liver Function Tests: No results for input(s): "AST", "ALT", "ALKPHOS", "BILITOT", "PROT", "ALBUMIN" in the last 168  hours.  No results for input(s): "LIPASE", "AMYLASE" in the last 168 hours. No results for input(s): "AMMONIA" in the last 168 hours. Coagulation Profile: No results for input(s): "INR", "PROTIME" in the last 168 hours. Cardiac Enzymes: Recent Labs  Lab 05/18/22 1722  CKTOTAL 36*   BNP (last 3 results) No results for input(s): "PROBNP" in the last 8760 hours. HbA1C: No results for input(s): "HGBA1C" in the last 72 hours.  CBG: Recent Labs  Lab 05/24/22 1239 05/24/22 1617 05/24/22 2111 05/25/22 0819 05/25/22 1141  GLUCAP 282* 199* 227* 274* 272*   Lipid Profile: No results for input(s): "CHOL", "HDL", "LDLCALC", "TRIG", "CHOLHDL", "LDLDIRECT" in the last 72 hours. Thyroid Function Tests: No results for input(s): "TSH", "T4TOTAL", "FREET4", "T3FREE", "THYROIDAB" in the last 72 hours. Anemia Panel: No results for input(s): "VITAMINB12", "FOLATE", "FERRITIN", "TIBC", "IRON", "RETICCTPCT" in the last 72 hours.  Sepsis Labs: No results for input(s): "PROCALCITON", "LATICACIDVEN" in the last 168 hours.  No results found for this or any previous visit (from the past 240 hour(s)).       Radiology Studies: No results found.      Scheduled Meds:  ascorbic acid  500 mg Oral Daily   fludrocortisone  0.1 mg Oral Daily   insulin aspart  0-6 Units Subcutaneous TID WC   insulin detemir  10 Units Subcutaneous Daily   insulin detemir  3 Units Subcutaneous Once   midodrine  10 mg Oral TID WC   pantoprazole  40 mg Oral BID   sodium chloride flush  3 mL Intravenous Q12H   thiamine  100 mg Oral Daily   Continuous Infusions:  sodium chloride Stopped (05/16/22 1457)     LOS: 10 days       Sidney Ace, MD [facility]   If 7PM-7AM, please contact night-coverage  05/25/2022, 2:08 PM

## 2022-05-25 NOTE — Inpatient Diabetes Management (Signed)
Inpatient Diabetes Program Recommendations  AACE/ADA: New Consensus Statement on Inpatient Glycemic Control (2015)  Target Ranges:  Prepandial:   less than 140 mg/dL      Peak postprandial:   less than 180 mg/dL (1-2 hours)      Critically ill patients:  140 - 180 mg/dL   Lab Results  Component Value Date   GLUCAP 274 (H) 05/25/2022   HGBA1C 9.1 (H) 05/20/2022    Review of Glycemic Control  Latest Reference Range & Units 05/24/22 12:39 05/24/22 16:17 05/24/22 21:11 05/25/22 08:19  Glucose-Capillary 70 - 99 mg/dL 282 (H) 199 (H) 227 (H) 274 (H)  (H): Data is abnormally high History: Type 1 diabetes   Home DM Meds:  Tresiba 20 units daily       Novolog 0-9 units TID        Farxiga 10 mg daily        Dexcom G6 CGM   Current Orders: Levemir 10 units daily                           Novolog 0-6 units TID         Consider: - Adding Novolog Meal Coverage: Novolog 2 units TID with meals HOLD if pt NPO HOLD if pt eats <50% meals -Increasing Levemir 12 units QD      Thanks, Bronson Curb, MSN, RNC-OB Diabetes Coordinator [phone] (8a-5p)

## 2022-05-25 NOTE — Progress Notes (Signed)
Physical Therapy Treatment Patient Details Name: Nicholas Weiss MRN: [id number] DOB: [birth year] Today's Date: 05/25/2022   History of Present Illness presented to ER secondary to recurrent falls (related to hypoglycemia?); admitted for management of SDH (33m, stable) and acute fracture to L humerus/greater tubercle.    PT Comments    Pt BP in bed prior to getting OOB with TED stocking (RLE) + abdominal binder 107/76. He is alert and oriented and agreeable to session. Applied CAM boot to LLE  in sitting. BP sitting 87/67(75) no symptoms of dizziness. Stood and ambulated to doorway of room and return. After ~ 20 ft pt started c/o symptoms of lightheadedness," I feel off." BP 73/53 (61) during gait. Symptoms improved once returned to sitting with BP quickly recovering 91/66 (76). Pt will benefit from SNF at DC to maximize activity tolerance and safety.    Recommendations for follow up therapy are one component of a multi-disciplinary discharge planning process, led by the attending physician.  Recommendations may be updated based on patient status, additional functional criteria and insurance authorization.  Follow Up Recommendations  Skilled nursing-short term rehab (<3 hours/day)     Assistance Recommended at Discharge Frequent or constant Supervision/Assistance  Patient can return home with the following A little help with walking and/or transfers;A little help with bathing/dressing/bathroom   Equipment Recommendations  Other (comment) (Defer to next level of care)       Precautions / Restrictions Precautions Precautions: Fall Restrictions Weight Bearing Restrictions: Yes LLE Weight Bearing: Weight bearing as tolerated     Mobility  Bed Mobility Overal bed mobility: Needs Assistance Bed Mobility: Supine to Sit  Supine to sit: Supervision General bed mobility comments: no physical assistance required to exit bed    Transfers Overall transfer level: Needs  assistance Equipment used: Rolling walker (2 wheels) Transfers: Sit to/from Stand Sit to Stand: Min guard   Ambulation/Gait Ambulation/Gait assistance: Min guard, Min assist Gait Distance (Feet): 30 Feet Assistive device: Rolling walker (2 wheels) Gait Pattern/deviations: Step-through pattern    General Gait Details: pt did ambulate from EOb to doorway of room prior to having symptoms of dizziness. Min assist once pt's BP/ symptoms of orthostatic hypotension    Balance Overall balance assessment: Needs assistance Sitting-balance support: No upper extremity supported, Feet supported Sitting balance-Leahy Scale: Good     Standing balance support: No upper extremity supported, During functional activity Standing balance-Leahy Scale: Fair Standing balance comment: high fall risk due to orthostatic hypotensive concerns       Cognition Arousal/Alertness: Awake/alert Behavior During Therapy: WFL for tasks assessed/performed Overall Cognitive Status: Within Functional Limits for tasks assessed    General Comments: Pt was A and O and agreeable to session.               Pertinent Vitals/Pain Pain Assessment Pain Assessment: No/denies pain Pain Score: 0-No pain     PT Goals (current goals can now be found in the care plan section) Acute Rehab PT Goals Patient Stated Goal: rehab then home Progress towards PT goals: Progressing toward goals    Frequency    Min 2X/week   PT Plan Current plan remains appropriate    Co-evaluation     PT goals addressed during session: Mobility/safety with mobility;Other (comment)        AM-PAC PT "6 Clicks" Mobility   Outcome Measure  Help needed turning from your back to your side while in a flat bed without using bedrails?: None Help needed moving from lying on your  back to sitting on the side of a flat bed without using bedrails?: A Little Help needed moving to and from a bed to a chair (including a wheelchair)?: A Little Help  needed standing up from a chair using your arms (e.g., wheelchair or bedside chair)?: A Little Help needed to walk in hospital room?: A Little Help needed climbing 3-5 steps with a railing? : A Little 6 Click Score: 19    End of Session Equipment Utilized During Treatment: Gait belt;Other (comment) (TED RLE/ Abdominal binder) Activity Tolerance: Patient tolerated treatment well;Other (comment) (limited by lower BP concerns) Patient left: in chair;with call bell/phone within reach;with chair alarm set Nurse Communication: Mobility status PT Visit Diagnosis: Other abnormalities of gait and mobility (R26.89);Unsteadiness on feet (R26.81)     Time: [id number] PT Time Calculation (min) (ACUTE ONLY): 23 min  Charges:  $Gait Training: 8-22 mins $Therapeutic Activity: 8-22 mins                    Julaine Fusi PTA 05/25/22, 10:22 AM

## 2022-05-25 NOTE — TOC Progression Note (Signed)
Transition of Care [facility]) - Progression Note    Patient Details  Name: Nicholas Weiss MRN: [id number] Date of Birth: [birth year]  Transition of Care [facility]) CM/SW Contact  Gerilyn Pilgrim, LCSW Phone Number: 05/25/2022, 11:54 AM  Clinical Narrative:   Josem Kaufmann approved for 3 days. 2/26-2/29 for [place]. Auth ID ID:[id number].         Expected Discharge Plan and Services                                               Social Determinants of Health (SDOH) Interventions SDOH Screenings   Food Insecurity: No Food Insecurity (05/15/2022)  Housing: Low Risk  (05/15/2022)  Transportation Needs: No Transportation Needs (05/15/2022)  Utilities: Not At Risk (05/15/2022)  Alcohol Screen: Low Risk  (10/02/2020)  Depression (PHQ2-9): Medium Risk (10/02/2020)  Financial Resource Strain: Low Risk  (01/01/2020)  Physical Activity: Insufficiently Active (01/01/2020)  Social Connections: Moderately Isolated (01/01/2020)  Stress: No Stress Concern Present (01/01/2020)  Tobacco Use: Low Risk  (05/15/2022)    Readmission Risk Interventions    05/20/2022   11:41 AM 03/12/2022   10:22 AM 02/25/2022    2:43 PM  Readmission Risk Prevention Plan  Transportation Screening Complete Complete Complete  Medication Review Press photographer) Complete Complete Complete  PCP or Specialist appointment within 3-5 days of discharge Complete Complete Complete  HRI or Home Care Consult Complete Complete Complete  SW Recovery Care/Counseling Consult  Complete   Palliative Care Screening Complete Not Applicable Not [place] Complete Not Applicable Not Applicable

## 2022-05-25 NOTE — Plan of Care (Signed)

## 2022-05-26 DIAGNOSIS — W19XXXD Unspecified fall, subsequent encounter: Secondary | ICD-10-CM | POA: Diagnosis not present

## 2022-05-26 DIAGNOSIS — I10 Essential (primary) hypertension: Secondary | ICD-10-CM | POA: Diagnosis not present

## 2022-05-26 LAB — GLUCOSE, CAPILLARY
Glucose-Capillary: 426 mg/dL — ABNORMAL HIGH (ref 70–99)
Glucose-Capillary: 407 mg/dL — ABNORMAL HIGH (ref 70–99)
Glucose-Capillary: 306 mg/dL — ABNORMAL HIGH (ref 70–99)
Glucose-Capillary: 188 mg/dL — ABNORMAL HIGH (ref 70–99)
Glucose-Capillary: 149 mg/dL — ABNORMAL HIGH (ref 70–99)

## 2022-05-26 MED ORDER — MIDODRINE HCL 5 MG PO TABS
15.0000 mg | ORAL_TABLET | Freq: Three times a day (TID) | ORAL | Status: DC
  Administered 2022-05-26 – 2022-05-28 (×7): 15 mg via ORAL
  Filled 2022-05-26 (×7): qty 3

## 2022-05-26 MED ORDER — ADULT MULTIVITAMIN W/MINERALS CH
1.0000 | ORAL_TABLET | Freq: Every day | ORAL | Status: DC
  Administered 2022-05-27 – 2022-05-28 (×2): 1 via ORAL
  Filled 2022-05-26 (×2): qty 1

## 2022-05-26 MED ORDER — GLUCERNA SHAKE PO LIQD
237.0000 mL | Freq: Three times a day (TID) | ORAL | Status: DC
  Administered 2022-05-26 – 2022-05-28 (×6): 237 mL via ORAL

## 2022-05-26 NOTE — Inpatient Diabetes Management (Signed)
Inpatient Diabetes Program Recommendations  AACE/ADA: New Consensus Statement on Inpatient Glycemic Control (2015)  Target Ranges:  Prepandial:   less than 140 mg/dL      Peak postprandial:   less than 180 mg/dL (1-2 hours)      Critically ill patients:  140 - 180 mg/dL   Lab Results  Component Value Date   GLUCAP 426 (H) 05/26/2022   HGBA1C 9.1 (H) 05/20/2022    Review of Glycemic Control  Latest Reference Range & Units 05/25/22 17:42 05/25/22 20:56 05/25/22 22:31 05/26/22 08:50  Glucose-Capillary 70 - 99 mg/dL 125 (H) 42 (LL) 144 (H) 426 (H)  (LL): Data is critically low (H): Data is abnormally high History: Type 1 diabetes   Home DM Meds:  Tresiba 20 units daily       Novolog 0-9 units TID        Farxiga 10 mg daily        Dexcom G6 CGM   Current Orders: Levemir 10 units daily                           Novolog 0-6 units TID   Noted hypoglycemia yesterday of 42 mg/dL and associated interventions. FSBG this AM 426 mg/dL. Has not eaten this AM. Secure chat sent to RN to verify intake. Per RN, intake has been limited because he doesn't like the food, however has been snacking inconsistently.   Secure chat sent to MD.  Thanks, Bronson Curb, MSN, RNC-OB Diabetes Coordinator [phone] (8a-5p)

## 2022-05-26 NOTE — Progress Notes (Signed)
PROGRESS NOTE    Nicholas Weiss  [id number] DOB: 26-Nov-1950 DOA: 05/14/2022 PCP: Dion Body, MD    Brief Narrative:  72 y.o. man from community with past medical history of diabetes melitis type I, hypoglycemia, hypertension, DKA and history of multiple falls who presented to the ED with complaints of fall.  Patient had multiple admissions for DKA in the past.  Was found to be in hypertensive urgency on presentation.  Patient was evaluated with CT scan of the head which showed subdural hematoma 7 mm in size.  Neurosurgery was consulted Dr Cari Caraway, patient was started on keppra for prophylaxis bid 500 mg bid x 7 days.  At the time of my evaluation patient was back to his baseline, confused.  No complaint of headache, blurring of vision, chest pain, palpitation, shortness of breath or abdominal pain.  No focal weakness.  No loss of consciousness no bowel bladder incontinence.   Patient was admitted to stepdown unit for maintenance of blood pressure in the setting of subdural hematoma.  Patient was kept on nicardipine drip.  Which was discontinued on 2/17 as blood pressure was in stable range.  Patient however developed hyperkalemia with potassium of 5.6 and hyponatremia 131.  Started patient on IV normal saline for worsening renal function with creatinine of 1.32.   2/17 : Patient seen and examined this morning.  No overnight events.  Back to baseline.  Had an episode of hypoglycemia with blood glucose of 20.  Repleted with improvement in mental status.  Continued with D10 half-normal saline for now.  Had hyperkalemia with potassium of 5.6 ordered Lokelma.  Insulin dose decreased to 6 units nightly with corrective scale with meals.   2/18 : Patient seen and examined this morning no overnight events.  Had early morning hypoglycemia managed with hypoglycemia protocol.  Patient morning glucose in the range of 120s to 150s.  Patient did not receive his Lantus last night.  Reports that he  takes [name] 15 units in the morning daily with no issues.  However patient has been having multiple falls likely secondary to hypoglycemia will need adjustment of insulin and endocrinology follow-up in the outpatient setting.  Patient presenting symptoms have resolved however needs optimization of insulin prior to discharge.   2/19: Patient is seen and examined at the bedside.  He is awake and alert and is oriented to person and place but not to time.   2/20 : Patient is seen and examined at bedside.  Eating breakfast and appears comfortable and in no distress.  2/21: Remains orthostatic and symptomatic 2/22: Remains orthostatic.  Less so than yesterday. 2/23: Remains orthostatic 2/24: Still symptomatic, dizzy when standing.  Insurance authorization iniated 2/25: Hypotension yesterday 2/26: Remains orthostatic\ 2/27: Orthostatic symptoms seem to be worsening.  This despite escalation of medication  Assessment & Plan:   Principal Problem:   Falls Active Problems:   Acute renal failure superimposed on stage 3a chronic kidney disease (HCC)   Essential (primary) hypertension   Cocaine abuse ([place])   History of substance abuse ([place])   Hypertensive urgency   Anemia of chronic kidney failure, stage 3 (moderate) (HCC)   Chronic hyponatremia   Benign prostatic hyperplasia with nocturia   Subdural hematoma (HCC)   Shoulder pain, left  Falls Orthostatic hypotension Pt sustained head injury and ct showed SDH. IMPRESSION: 1. Small right frontal subdural hematoma measuring 7 mm in thickness with mild mass effect upon the subjacent right frontal cortical gyri. No midline shift. 2. Stable mild  Evaluated by PT and noted to have orthostatic blood pressure changes which could have contributed to his frequent falls Plan: Orthostatic hypotension persists.  Had a near syncopal event working with physical therapy today.  Increase midodrine to 15 mg 3 times daily.  Continue Florinef 0.1 mg daily.   Reassess in AM.  If remains symptomatic can consider increasing Florinef to 0.2 mg daily.  Abdominal binder and compression stockings when ambulating or out of bed.  Hopeful to discharge in 24 hours.  Patient's insurance authorization expires 2/28.     Hyperkalemia  Improved   Diabetes mellitus type 1  Patient is on oral hypoglycemic agent along with [name].   On admission patient was placed on corrective scale insulin along with Lantus 7 units.    Has had issues with hypoglycemia and labile sugars Sugar very elevated on 2/24.  Per RN patient was found with an empty large bag of Cheetos by bedside. Plan: Continue Semglee 10 units daily Very sensitive sliding scale No nightly coverage Educated on dietary discretion though patient continues to eat outside food   Acute renal failure superimposed on stage 3a chronic kidney disease ([place]) Renal function has improved and is back to baseline CK levels are within normal limits   Cocaine abuse  High risk for IC bleed.  Patient has been counseled on the need to abstain from further cocaine use   Hypertensive urgency Pt started on nicardipine drip for hypertensive urgency.  Has been off since 02/17 Noted to have orthostatic blood pressure changes and started on midodrine BP reasonably well-controlled   Shoulder pain, left Cont with prn pain meds and fall precaution. Attribute to fall.    Subdural hematoma  Continue keppra per neurosurgery recommendation to complete a 7-day course of therapy     Chronic hyponatremia Stable and mild.   Anemia of chronic kidney failure, stage 3 (moderate)  Attribute history of multiple myeloma (no ER evaluation). Transfuse as needed       Osteomyelitis Status post recent amputation of left great toe and left partial second toe amputation Continue Augmentin to complete the course    DVT prophylaxis: SCD Code Status: Full Family Communication: None Disposition Plan: Status is: Inpatient Remains  inpatient appropriate because: Symptomatic orthostatic hypotension.  Difficult improvement   Level of care: Telemetry Medical  Consultants:  None  Procedures:  None  Antimicrobials: None   Subjective: Seen and examined.  When resting in bed patient is stable however has significant orthostatic drop when ambulating  Objective: Vitals:   05/25/22 1947 05/26/22 0238 05/26/22 0630 05/26/22 0933  BP: (!) 178/91  137/88 105/86  Pulse: 68  78 100  Resp: '18  18 16  '$ Temp: 98.2 F (36.8 C)  98.1 F (36.7 C)   TempSrc: Oral  Oral   SpO2: 100%  100% 100%  Weight:  66.4 kg    Height:        Intake/Output Summary (Last 24 hours) at 05/26/2022 1210 Last data filed at 05/26/2022 1018 Gross per 24 hour  Intake 360 ml  Output 725 ml  Net -365 ml   Filed Weights   05/24/22 0600 05/25/22 0233 05/26/22 0238  Weight: 64 kg 67.2 kg 66.4 kg    Examination:  General exam: No acute distress Respiratory system: Lungs clear normal work of breathing.  Room air Cardiovascular system: S1-S2, RRR, no murmurs, no pedal edema Gastrointestinal system: Soft, NT/ND, normal bowel sounds Central nervous system: Alert and oriented. No focal neurological deficits. Extremities: Bilateral lower  extremity weakness, left lower extremity in surgical boot Skin: No rashes, lesions or ulcers Psychiatry: Judgement and insight appear normal. Mood & affect flattened.     Data Reviewed: I have personally reviewed following labs and imaging studies  CBC: Recent Labs  Lab 05/20/22 0411 05/22/22 0726 05/24/22 0817  WBC 4.0 2.7* 3.0*  NEUTROABS  --  1.2* 1.4*  HGB 8.4* 8.0* 8.3*  HCT 26.9* 24.8* 25.7*  MCV 92.4 91.5 92.1  PLT 357 355 [id number]   Basic Metabolic Panel: Recent Labs  Lab 05/22/22 0726 05/24/22 0817  NA 131* 133*  K 4.5 4.9  CL 102 101  CO2 22 25  GLUCOSE 305* 213*  BUN 30* 35*  CREATININE 1.12 1.45*  CALCIUM 8.2* 8.8*   GFR: Estimated Creatinine Clearance: 45.2 mL/min (A) (by C-G  formula based on SCr of 1.45 mg/dL (H)). Liver Function Tests: No results for input(s): "AST", "ALT", "ALKPHOS", "BILITOT", "PROT", "ALBUMIN" in the last 168 hours.  No results for input(s): "LIPASE", "AMYLASE" in the last 168 hours. No results for input(s): "AMMONIA" in the last 168 hours. Coagulation Profile: No results for input(s): "INR", "PROTIME" in the last 168 hours. Cardiac Enzymes: No results for input(s): "CKTOTAL", "CKMB", "CKMBINDEX", "TROPONINI" in the last 168 hours.  BNP (last 3 results) No results for input(s): "PROBNP" in the last 8760 hours. HbA1C: No results for input(s): "HGBA1C" in the last 72 hours.  CBG: Recent Labs  Lab 05/25/22 2056 05/25/22 2231 05/26/22 0850 05/26/22 1050 05/26/22 1204  GLUCAP 42* 144* 426* 407* 306*   Lipid Profile: No results for input(s): "CHOL", "HDL", "LDLCALC", "TRIG", "CHOLHDL", "LDLDIRECT" in the last 72 hours. Thyroid Function Tests: No results for input(s): "TSH", "T4TOTAL", "FREET4", "T3FREE", "THYROIDAB" in the last 72 hours. Anemia Panel: No results for input(s): "VITAMINB12", "FOLATE", "FERRITIN", "TIBC", "IRON", "RETICCTPCT" in the last 72 hours.  Sepsis Labs: No results for input(s): "PROCALCITON", "LATICACIDVEN" in the last 168 hours.  No results found for this or any previous visit (from the past 240 hour(s)).       Radiology Studies: No results found.      Scheduled Meds:  ascorbic acid  500 mg Oral Daily   fludrocortisone  0.1 mg Oral Daily   insulin aspart  0-6 Units Subcutaneous TID WC   insulin detemir  10 Units Subcutaneous Daily   insulin detemir  3 Units Subcutaneous Once   midodrine  15 mg Oral TID WC   pantoprazole  40 mg Oral BID   sodium chloride flush  3 mL Intravenous Q12H   thiamine  100 mg Oral Daily   Continuous Infusions:  sodium chloride Stopped (05/16/22 1457)     LOS: 11 days       Sidney Ace, MD [facility]   If 7PM-7AM, please contact  night-coverage  05/26/2022, 12:10 PM

## 2022-05-26 NOTE — Progress Notes (Signed)
Physical Therapy Treatment Patient Details Name: Nicholas Weiss MRN: [id number] DOB: [birth year] Today's Date: 05/26/2022   History of Present Illness Pt is a 72 yo M who presented to ER secondary to recurrent falls (related to hypoglycemia?); admitted for management of SDH (9m, stable) and acute fracture to L humerus/greater tubercle.  MD assessment also includes: orthostatic hypotension, cocaine abuse, hyperkalemia, acute renal failure superimposed on stage 3a chronic kidney disease, anemia of chronic kidney failure, L foot osteomyelitis s/p recent amputation of left great toe and left partial second toe amputation.    PT Comments    Pt was pleasant and motivated to participate during the session and put forth good effort throughout.  Orthostatic vitals taken as follows: supine 102/67, HR 93; sitting 74/58 with HR 116.  Just after completion of seated BP the pt began to lose consciousness including beginning to fall backwards and losing ability to follow commands or answer questions.  Pt assisted back to supine and returned to baseline level of consciousness quickly.  Supine BP taken at 138/96 with HR 89 and then repeated after around 3 min at 110/78 with HR 89, MD and nursing notified.  Pt will benefit from PT services in a SNF setting upon discharge to safely address deficits listed in patient problem list for decreased caregiver assistance and eventual return to PLOF.       Recommendations for follow up therapy are one component of a multi-disciplinary discharge planning process, led by the attending physician.  Recommendations may be updated based on patient status, additional functional criteria and insurance authorization.  Follow Up Recommendations  Skilled nursing-short term rehab (<3 hours/day) Can patient physically be transported by private vehicle: No   Assistance Recommended at Discharge Frequent or constant Supervision/Assistance  Patient can return home with the following A  little help with bathing/dressing/bathroom;Assist for transportation;A lot of help with walking and/or transfers   Equipment Recommendations  Other (comment) (TBD)    Recommendations for Other Services       Precautions / Restrictions Precautions Precautions: Fall Restrictions Weight Bearing Restrictions: Yes LUE Weight Bearing: Weight bearing as tolerated LLE Weight Bearing: Weight bearing as tolerated Other Position/Activity Restrictions: Per previous notes, WBAT with CAM boot to LLE; LUE WBAT per Dr. SPriscella Mann    Mobility  Bed Mobility Overal bed mobility: Modified Independent             General bed mobility comments: Min extra time and effort only    Transfers                   General transfer comment: Unsafe to attempt secondary to orthostatic hypotension    Ambulation/Gait                   Stairs             Wheelchair Mobility    Modified Rankin (Stroke Patients Only)       Balance Overall balance assessment: Needs assistance Sitting-balance support: No upper extremity supported, Feet supported Sitting balance-Leahy Scale: Good                                      Cognition Arousal/Alertness: Awake/alert Behavior During Therapy: WFL for tasks assessed/performed Overall Cognitive Status: Within Functional Limits for tasks assessed  Exercises Total Joint Exercises Ankle Circles/Pumps: AROM, Strengthening, Both, 5 reps, 10 reps Quad Sets: Strengthening, Both, 5 reps, 10 reps Heel Slides: Strengthening, Both, 5 reps Hip ABduction/ADduction: Strengthening, Both, 10 reps Straight Leg Raises: Strengthening, Both, 10 reps Long Arc Quad: Strengthening, Both, 10 reps    General Comments        Pertinent Vitals/Pain Pain Assessment Pain Assessment: No/denies pain    Home Living                          Prior Function            PT  Goals (current goals can now be found in the care plan section) Progress towards PT goals: Not progressing toward goals - comment (limited by orthostatic hypotension)    Frequency    Min 2X/week      PT Plan Current plan remains appropriate    Co-evaluation              AM-PAC PT "6 Clicks" Mobility   Outcome Measure  Help needed turning from your back to your side while in a flat bed without using bedrails?: None Help needed moving from lying on your back to sitting on the side of a flat bed without using bedrails?: A Little Help needed moving to and from a bed to a chair (including a wheelchair)?: A Little Help needed standing up from a chair using your arms (e.g., wheelchair or bedside chair)?: A Little Help needed to walk in hospital room?: A Lot Help needed climbing 3-5 steps with a railing? : Total 6 Click Score: 16    End of Session Equipment Utilized During Treatment: Gait belt;Other (comment) (TED hose to RLE and abdominal binder) Activity Tolerance: Other (comment) (limited by orthostatic hypotension) Patient left: in bed;with call bell/phone within reach;with bed alarm set Nurse Communication: Mobility status;Other (comment) (orthostatic hypotension per above) PT Visit Diagnosis: Other abnormalities of gait and mobility (R26.89);Unsteadiness on feet (R26.81)     Time: [id number] PT Time Calculation (min) (ACUTE ONLY): 28 min  Charges:  $Therapeutic Exercise: 8-22 mins $Therapeutic Activity: 8-22 mins                     D. Scott Sherrise Liberto PT, DPT 05/26/22, 11:49 AM

## 2022-05-26 NOTE — Progress Notes (Signed)
Mobility Specialist - Progress Note   05/26/22 1600  Mobility  Activity Turned to right side;Turned to left side;Turned to back - supine  Range of Motion/Exercises Active (ADLs)  $Mobility charge 1 Mobility     Pt lying in bed upon arrival, utilizing RA. Pt requesting assistance for hygiene care upon entry. Set-up assist for bathing tasks. Rolled L/R for peri-care; able to bridge for donning of underwear. Completed bed-level therex prior to exit. Pt denied dizziness during session. Pt left in bed with alarm set, needs in reach.    Kathee Delton Mobility Specialist 05/26/22, 4:17 PM

## 2022-05-26 NOTE — Progress Notes (Signed)
Occupational Therapy Treatment Patient Details Name: Nicholas Weiss MRN: [id number] DOB: 11-01-1950 Today's Date: 05/26/2022   History of present illness Pt is a 72 yo M who presented to ER secondary to recurrent falls (related to hypoglycemia?); admitted for management of SDH (60m, stable) and acute fracture to L humerus/greater tubercle.  MD assessment also includes: orthostatic hypotension, cocaine abuse, hyperkalemia, acute renal failure superimposed on stage 3a chronic kidney disease, anemia of chronic kidney failure, L foot osteomyelitis s/p recent amputation of left great toe and left partial second toe amputation.   OT comments  Upon entering the room, pt supine in bed and agreeable to OT intervention. Pt reports feeling very tired and "just terrible". Pt is aware of orthostatic hypotension concerns. Pt asking for cloth to wash face with set up A to obtain needed items. Pt performs B ankle pumps and glute squeezes in bed x 5- 10 reps each. OT then offering to assist pt to EOB or into recliner chair but pt declined because he is feeling unwell at this time. All needs within reach and bed alarm activated for safety.    Recommendations for follow up therapy are one component of a multi-disciplinary discharge planning process, led by the attending physician.  Recommendations may be updated based on patient status, additional functional criteria and insurance authorization.    Follow Up Recommendations  Skilled nursing-short term rehab (<3 hours/day)     Assistance Recommended at Discharge Frequent or constant Supervision/Assistance  Patient can return home with the following  A lot of help with walking and/or transfers;A lot of help with bathing/dressing/bathroom;Assistance with cooking/housework;Direct supervision/assist for medications management;Direct supervision/assist for financial management;Assist for transportation;Help with stairs or ramp for entrance   Equipment Recommendations   Other (comment) (defer to next venue of care)       Precautions / Restrictions Precautions Precautions: Fall Restrictions Weight Bearing Restrictions: Yes LUE Weight Bearing: Weight bearing as tolerated LLE Weight Bearing: Weight bearing as tolerated Other Position/Activity Restrictions: Per previous notes, WBAT with CAM boot to LLE; LUE WBAT per Dr. SPriscella Mann      Mobility Bed Mobility Overal bed mobility: Modified Independent                  Transfers                   General transfer comment: Pt refused attempts         ADL either performed or assessed with clinical judgement   ADL Overall ADL's : Needs assistance/impaired                                             Vision Patient Visual Report: No change from baseline            Cognition Arousal/Alertness: Awake/alert Behavior During Therapy: WFL for tasks assessed/performed Overall Cognitive Status: Within Functional Limits for tasks assessed                                                     Pertinent Vitals/ Pain       Pain Assessment Pain Assessment: No/denies pain         Frequency  Min 2X/week  Progress Toward Goals  OT Goals(current goals can now be found in the care plan section)  Progress towards OT goals: Progressing toward goals     Plan Discharge plan remains appropriate;Frequency remains appropriate       AM-PAC OT "6 Clicks" Daily Activity     Outcome Measure   Help from another person eating meals?: None Help from another person taking care of personal grooming?: A Little Help from another person toileting, which includes using toliet, bedpan, or urinal?: A Lot Help from another person bathing (including washing, rinsing, drying)?: A Lot Help from another person to put on and taking off regular upper body clothing?: A Little Help from another person to put on and taking off regular lower body clothing?: A  Little 6 Click Score: 17    End of Session    OT Visit Diagnosis: Unsteadiness on feet (R26.81);Muscle weakness (generalized) (M62.81);Repeated falls (R29.6)   Activity Tolerance Patient limited by fatigue   Patient Left in bed;with bed alarm set;with call bell/phone within reach   Nurse Communication Mobility status        Time: 1353-1405 OT Time Calculation (min): 12 min  Charges: OT General Charges $OT Visit: 1 Visit OT Treatments $Therapeutic Activity: 8-22 mins  Darleen Crocker, MS, OTR/L , CBIS ascom [phone]  05/26/22, 2:14 PM

## 2022-05-27 DIAGNOSIS — E871 Hypo-osmolality and hyponatremia: Secondary | ICD-10-CM

## 2022-05-27 DIAGNOSIS — N1831 Chronic kidney disease, stage 3a: Secondary | ICD-10-CM

## 2022-05-27 DIAGNOSIS — N401 Enlarged prostate with lower urinary tract symptoms: Secondary | ICD-10-CM

## 2022-05-27 DIAGNOSIS — N179 Acute kidney failure, unspecified: Secondary | ICD-10-CM

## 2022-05-27 DIAGNOSIS — I951 Orthostatic hypotension: Secondary | ICD-10-CM

## 2022-05-27 DIAGNOSIS — R351 Nocturia: Secondary | ICD-10-CM

## 2022-05-27 DIAGNOSIS — I160 Hypertensive urgency: Secondary | ICD-10-CM

## 2022-05-27 LAB — COMPREHENSIVE METABOLIC PANEL
ALT: 49 U/L — ABNORMAL HIGH (ref 0–44)
AST: 48 U/L — ABNORMAL HIGH (ref 15–41)
Albumin: 3.2 g/dL — ABNORMAL LOW (ref 3.5–5.0)
Alkaline Phosphatase: 83 U/L (ref 38–126)
Anion gap: 7 (ref 5–15)
BUN: 41 mg/dL — ABNORMAL HIGH (ref 8–23)
CO2: 25 mmol/L (ref 22–32)
Calcium: 8.9 mg/dL (ref 8.9–10.3)
Chloride: 101 mmol/L (ref 98–111)
Creatinine, Ser: 1.31 mg/dL — ABNORMAL HIGH (ref 0.61–1.24)
GFR, Estimated: 59 mL/min — ABNORMAL LOW (ref >60)
Glucose, Bld: 238 mg/dL — ABNORMAL HIGH (ref 70–99)
Potassium: 4.5 mmol/L (ref 3.5–5.1)
Sodium: 133 mmol/L — ABNORMAL LOW (ref 135–145)
Total Bilirubin: 0.6 mg/dL (ref 0.3–1.2)
Total Protein: 8 g/dL (ref 6.5–8.1)

## 2022-05-27 LAB — CBC WITH DIFFERENTIAL/PLATELET
Abs Immature Granulocytes: 0.01 10*3/uL (ref 0.00–0.07)
Basophils Absolute: 0 10*3/uL (ref 0.0–0.1)
Basophils Relative: 1 %
Eosinophils Absolute: 0.1 10*3/uL (ref 0.0–0.5)
Eosinophils Relative: 3 %
HCT: 27.6 % — ABNORMAL LOW (ref 39.0–52.0)
Hemoglobin: 8.8 g/dL — ABNORMAL LOW (ref 13.0–17.0)
Immature Granulocytes: 0 %
Lymphocytes Relative: 43 %
Lymphs Abs: 1.2 10*3/uL (ref 0.7–4.0)
MCH: 29.4 pg (ref 26.0–34.0)
MCHC: 31.9 g/dL (ref 30.0–36.0)
MCV: 92.3 fL (ref 80.0–100.0)
Monocytes Absolute: 0.3 10*3/uL (ref 0.1–1.0)
Monocytes Relative: 11 %
Neutro Abs: 1.2 10*3/uL — ABNORMAL LOW (ref 1.7–7.7)
Neutrophils Relative %: 42 %
Platelets: 369 10*3/uL (ref 150–400)
RBC: 2.99 MIL/uL — ABNORMAL LOW (ref 4.22–5.81)
RDW: 17 % — ABNORMAL HIGH (ref 11.5–15.5)
WBC: 2.8 10*3/uL — ABNORMAL LOW (ref 4.0–10.5)
nRBC: 0 % (ref 0.0–0.2)

## 2022-05-27 LAB — GLUCOSE, CAPILLARY
Glucose-Capillary: 172 mg/dL — ABNORMAL HIGH (ref 70–99)
Glucose-Capillary: 225 mg/dL — ABNORMAL HIGH (ref 70–99)
Glucose-Capillary: 258 mg/dL — ABNORMAL HIGH (ref 70–99)
Glucose-Capillary: 233 mg/dL — ABNORMAL HIGH (ref 70–99)

## 2022-05-27 LAB — MAGNESIUM: Magnesium: 2.1 mg/dL (ref 1.7–2.4)

## 2022-05-27 LAB — PHOSPHORUS: Phosphorus: 3.3 mg/dL (ref 2.5–4.6)

## 2022-05-27 MED ORDER — SODIUM CHLORIDE 0.9 % IV BOLUS
500.0000 mL | Freq: Once | INTRAVENOUS | Status: AC
  Administered 2022-05-27: 500 mL via INTRAVENOUS

## 2022-05-27 MED ORDER — SODIUM CHLORIDE 0.9 % IV SOLN: INTRAVENOUS | Status: DC

## 2022-05-27 NOTE — Progress Notes (Signed)
PROGRESS NOTE    Nicholas Weiss  [id number] DOB: 1951/03/27 DOA: 05/14/2022 PCP: Nicholas Body, MD   Brief Narrative:  Patient is a 72 year old African-American male with a past medical history significant for but #2 diabetes mellitus type 1 complicated with hypoglycemia, hypertension, history of DKA as well as other comorbidities who has a history of multiple falls who presented to the ED with a complaint of a fall almost 2 weeks ago.  He has had multiple admissions for DKA in the past and he was found to be hypertensive in the emergency room on presentation.  He was evaluated with a CT scan of his head which showed a subdural hematoma 7 mm in size and neurosurgery evaluated and recommended Keppra for prophylaxis for 7 days.  Repeat head CT was stable and he was admitted to stepdown unit given his confusion on admission with elevated blood pressure.  He was put on nicardipine drip which was discontinued on 05/16/2022 blood pressure remained stable.  He developed hyperkalemia which was then improved with IV fluid hydration.  Current Hospital course is as below:  2/17 : Patient seen and examined this morning.  No overnight events.  Back to baseline.  Had an episode of hypoglycemia with blood glucose of 20.  Repleted with improvement in mental status.  Continued with D10 half-normal saline for now.  Had hyperkalemia with potassium of 5.6 ordered Lokelma.  Insulin dose decreased to 6 units nightly with corrective scale with meals.   2/18 : Patient seen and examined this morning no overnight events.  Had early morning hypoglycemia managed with hypoglycemia protocol.  Patient morning glucose in the range of 120s to 150s.  Patient did not receive his Lantus last night.  Reports that he takes [name] 15 units in the morning daily with no issues.  However patient has been having multiple falls likely secondary to hypoglycemia will need adjustment of insulin and endocrinology follow-up in the outpatient  setting.  Patient presenting symptoms have resolved however needs optimization of insulin prior to discharge.   2/19: Patient is seen and examined at the bedside.  He is awake and alert and is oriented to person and place but not to time.   2/20 : Patient is seen and examined at bedside.  Eating breakfast and appears comfortable and in no distress.   2/21: Remains orthostatic and symptomatic 2/22: Remains orthostatic.  Less so than yesterday. 2/23: Remains orthostatic 2/24: Still symptomatic, dizzy when standing.  Insurance authorization iniated 2/25: Hypotension yesterday 2/26: Remains orthostatic 2/27: Orthostatic symptoms seem to be worsening.  This despite escalation of medication  **2/28: Remains persistently orthostatic and for unclear reasons did not have an abdominal binder or TED hose today.  Will resume IV fluid hydration given a 500 mL bolus and repeat orthostatic vitals in the AM.  PT OT still recommending SNF and he will need to new authorization given that his authorization ran out today.  Assessment and Plan:  Falls Orthostatic hypotension -Pt sustained head injury and ct showed SDH. -CT showed "Small right frontal subdural hematoma measuring 7 mm in thickness with mild mass effect upon the subjacent right frontal cortical gyri. No midline shift." -Repeat Head CT done and showed "Unchanged small subdural hematoma along the right frontal convexity, measuring up to 7 mm. Unchanged mild mass effect on the right frontal lobe, without significant midline shift." -Evaluated by PT and noted to have orthostatic blood pressure changes which could have contributed to his frequent falls -Orthostatic hypotension persists.  Had a near syncopal event working with physical therapy today.  Increase midodrine to 15 mg 3 times daily.  Continue Florinef 0.1 mg daily.  -Resume IV fluid hydration and obtain new TED hose as he did not have 1 today and also did not have an abdominal binder.  I  given 500 mL bolus and have started him on maintenance IV fluids at 75 mL/h -Reassess in AM.  If remains symptomatic can consider increasing Florinef to 0.2 mg daily.  -May need cardiology evaluation and assistance if he continues to be persistently orthostatic but will give him a fluid bolus today and start him on fluid hydration and ensure that he has TED hose as he did not have them today or an abdominal binder.   HyperKalemia  -Patient's K+ Level Trend: Recent Labs  Lab 05/15/22 0207 05/16/22 0834 05/17/22 0757 05/19/22 0930 05/22/22 0726 05/24/22 0817 05/27/22 1024  K 4.3 5.6* 3.9 3.7 4.5 4.9 4.5  -Continue to Monitor and Trend -Repeat CMP in the AM    Diabetes Mellitus Type 1 -Patient is on oral hypoglycemic agent along with [name].   -On admission patient was placed on corrective scale insulin along with Lantus 7 units.    -Has had issues with hypoglycemia and labile sugars Sugar very elevated on 2/24.   -Per RN patient was found with an empty large bag of Cheetos by bedside. Continue Semglee 10 units daily -Very sensitive sliding scale -No nightly coverage -Educated on dietary discretion though patient continues to eat outside food -CBGs ranging from 149-258    Cocaine abuse  -High risk for IC bleed.  -Patient has been counseled on the need to abstain from further cocaine use   Hypertensive urgency -Pt started on nicardipine drip for hypertensive urgency.   -Has been off since 02/17 Noted to have orthostatic blood pressure changes and started on midodrine -BP reasonably well-controlled   Shoulder pain, left -Cont with prn pain meds and fall precaution. Attribute to fall.  -PT/OT to further evaluate and Treat   Subdural Hematoma  -Continue keppra per neurosurgery recommendation to complete a 7-day course of therapy -Neurosurgery felt that it was a small traumatic subdural hematoma right-sided but he felt he is clinically stable with no surgical intervention  needed.   -Per neurosurgery they are recommending repeating CT scan within a month but if he has a change in his neurological condition they are recommending obtaining additional CT scan and contacting the neurosurgical provider on-call for further recommendations   AKI on CKD Stage 3a -BUN/Cr Trend: Recent Labs  Lab 05/15/22 0207 05/16/22 0834 05/17/22 0757 05/19/22 0930 05/22/22 0726 05/24/22 0817 05/27/22 1024  BUN 24* 27* 39* 30* 30* 35* 41*  CREATININE 1.03  1.06 1.32* 1.83* 1.15 1.12 1.45* 1.31*  -Will give the patient a 500 mL bolus and start him on maintenance IV fluid hydration at 75 MLS per hour for 1 day -Avoid Nephrotoxic Medications, Contrast Dyes, Hypotension and Dehydration to Ensure Adequate Renal Perfusion and will need to Renally Adjust Meds -Continue to Monitor and Trend Renal Function carefully and repeat CMP in the AM   Chronic hyponatremia -Stable and mild. -Na+ Trend: Recent Labs  Lab 05/15/22 0207 05/16/22 0834 05/17/22 0757 05/19/22 0930 05/22/22 0726 05/24/22 0817 05/27/22 1024  NA 131* 131* 133* 134* 131* 133* 133*  -Start NS at 75 mL/hr and give him a bolus of NS 500 mL today  -Continue to monitor and trend and repeat CMP in a.m.   Anemia of  chronic kidney failure, stage 3 (moderate)  -Attribute history of multiple myeloma (no ER evaluation). Recent Labs  Lab 05/15/22 0207 05/15/22 0357 05/17/22 0757 05/20/22 0411 05/22/22 0726 05/24/22 0817 05/27/22 1024  HGB 7.9* 7.6* 7.6* 8.4* 8.0* 8.3* 8.8*  HCT 25.5* 24.7* 23.4* 26.9* 24.8* 25.7* 27.6*  MCV 94.1 94.3 91.8 92.4 91.5 92.1 92.3  -Appears relatively stable last few checks -Continue to monitor for signs and symptoms of bleeding; no overt signs or symptoms of bleeding -Repeat CBC in a.m.  Osteomyelitis -Status post recent amputation of left great toe and left partial second toe amputation -WBC Trend: Recent Labs  Lab 05/15/22 0207 05/15/22 0357 05/17/22 0757 05/20/22 0411  05/22/22 0726 05/24/22 0817 05/27/22 1024  WBC 5.3 4.4 3.7* 4.0 2.7* 3.0* 2.8*  -Continue Augmentin to complete the course   DVT prophylaxis: SCDs Start: 05/15/22 0103    Code Status: Full Code Family Communication: No family currently at bedside  Disposition Plan:  Level of care: Telemetry Medical Status is: Inpatient Remains inpatient appropriate because: Remains persistently orthostatic and will need further clinical improvement prior to safe discharge disposition to SNF   Consultants:  Neurosurgery  Procedures:  As delineated as above  Antimicrobials:  Anti-infectives (From admission, onward)    Start     Dose/Rate Route Frequency Ordered Stop   05/15/22 1130  amoxicillin-clavulanate (AUGMENTIN) 875-125 MG per tablet 1 tablet        1 tablet Oral Every 12 hours 05/15/22 1056 05/23/22 2104       Subjective: Seen and examined at bedside he is resting in the bed and wanting to sleep.  Became orthostatic again working with occupational therapy this afternoon.  Denies any nausea or vomiting.  No other concerns or complaints at this time.  Objective: Vitals:   05/26/22 1914 05/27/22 0504 05/27/22 0607 05/27/22 0803  BP: (!) 183/92 135/80  130/71  Pulse: 69 69  71  Resp: '20 17  16  '$ Temp: 98 F (36.7 C) (!) 97.3 F (36.3 C)  98.1 F (36.7 C)  TempSrc:  Oral    SpO2: 100% 100%  93%  Weight:   65.1 kg   Height:        Intake/Output Summary (Last 24 hours) at 05/27/2022 [id number] Last data filed at 05/27/2022 0533 Gross per 24 hour  Intake 597 ml  Output 775 ml  Net -178 ml   Filed Weights   05/25/22 0233 05/26/22 0238 05/27/22 0607  Weight: 67.2 kg 66.4 kg 65.1 kg   Examination: Physical Exam:  Constitutional: WN/WD chronically ill-appearing African-American male currently no acute distress Respiratory: Diminished to auscultation bilaterally, no wheezing, rales, rhonchi or crackles. Normal respiratory effort and patient is not tachypenic. No accessory muscle use.   Unlabored breathing Cardiovascular: RRR, no murmurs / rubs / gallops. S1 and S2 auscultated. No extremity edema.  Abdomen: Soft, non-tender, non-distended.  Bowel sounds positive.  GU: Deferred. Musculoskeletal: No clubbing / cyanosis of digits/nails. No joint deformity upper and lower extremities. Skin: No rashes, lesions, ulcers on limited skin evaluation. No induration; Warm and dry.  Neurologic: He is a little sleepy and had just woken up but has no acute focal deficits and cranial nerves II through XII grossly intact. Psychiatric: Normal judgment and insight.  He was little somnolent and drowsy but easily arousable.  Data Reviewed: I have personally reviewed following labs and imaging studies  CBC: Recent Labs  Lab 05/22/22 0726 05/24/22 0817  WBC 2.7* 3.0*  NEUTROABS 1.2* 1.4*  HGB  8.0* 8.3*  HCT 24.8* 25.7*  MCV 91.5 92.1  PLT 355 [id number]   Basic Metabolic Panel: Recent Labs  Lab 05/22/22 0726 05/24/22 0817  NA 131* 133*  K 4.5 4.9  CL 102 101  CO2 22 25  GLUCOSE 305* 213*  BUN 30* 35*  CREATININE 1.12 1.45*  CALCIUM 8.2* 8.8*   GFR: Estimated Creatinine Clearance: 44.3 mL/min (A) (by C-G formula based on SCr of 1.45 mg/dL (H)). Liver Function Tests: No results for input(s): "AST", "ALT", "ALKPHOS", "BILITOT", "PROT", "ALBUMIN" in the last 168 hours. No results for input(s): "LIPASE", "AMYLASE" in the last 168 hours. No results for input(s): "AMMONIA" in the last 168 hours. Coagulation Profile: No results for input(s): "INR", "PROTIME" in the last 168 hours. Cardiac Enzymes: No results for input(s): "CKTOTAL", "CKMB", "CKMBINDEX", "TROPONINI" in the last 168 hours. BNP (last 3 results) No results for input(s): "PROBNP" in the last 8760 hours. HbA1C: No results for input(s): "HGBA1C" in the last 72 hours. CBG: Recent Labs  Lab 05/26/22 1050 05/26/22 1204 05/26/22 1636 05/26/22 2206 05/27/22 0800  GLUCAP 407* 306* 188* 149* 172*   Lipid Profile: No  results for input(s): "CHOL", "HDL", "LDLCALC", "TRIG", "CHOLHDL", "LDLDIRECT" in the last 72 hours. Thyroid Function Tests: No results for input(s): "TSH", "T4TOTAL", "FREET4", "T3FREE", "THYROIDAB" in the last 72 hours. Anemia Panel: No results for input(s): "VITAMINB12", "FOLATE", "FERRITIN", "TIBC", "IRON", "RETICCTPCT" in the last 72 hours. Sepsis Labs: No results for input(s): "PROCALCITON", "LATICACIDVEN" in the last 168 hours.  No results found for this or any previous visit (from the past 240 hour(s)).   Radiology Studies: No results found.  Scheduled Meds:  ascorbic acid  500 mg Oral Daily   feeding supplement (GLUCERNA SHAKE)  237 mL Oral TID BM   fludrocortisone  0.1 mg Oral Daily   insulin aspart  0-6 Units Subcutaneous TID WC   insulin detemir  10 Units Subcutaneous Daily   insulin detemir  3 Units Subcutaneous Once   midodrine  15 mg Oral TID WC   multivitamin with minerals  1 tablet Oral Daily   pantoprazole  40 mg Oral BID   sodium chloride flush  3 mL Intravenous Q12H   thiamine  100 mg Oral Daily   Continuous Infusions:  sodium chloride Stopped (05/16/22 1457)    LOS: 12 days   Nicholas Noble, DO [facility] Available via Epic secure chat 7am-7pm After these hours, please refer to coverage provider listed on [email or web address] 05/27/2022, 8:11 AM

## 2022-05-27 NOTE — Progress Notes (Signed)
Occupational Therapy Treatment Patient Details Name: Nicholas Weiss MRN: [id number] DOB: [birth year] Today's Date: 05/27/2022   History of present illness Pt is a 72 yo M who presented to ER secondary to recurrent falls (related to hypoglycemia?); admitted for management of SDH (38m, stable) and acute fracture to L humerus/greater tubercle.  MD assessment also includes: orthostatic hypotension, cocaine abuse, hyperkalemia, acute renal failure superimposed on stage 3a chronic kidney disease, anemia of chronic kidney failure, L foot osteomyelitis s/p recent amputation of left great toe and left partial second toe amputation.   OT comments  Pt participatory, but limited by orthostatics today. See flowsheet for details. OT reported to MD, PT, RN.   Recommendations for follow up therapy are one component of a multi-disciplinary discharge planning process, led by the attending physician.  Recommendations may be updated based on patient status, additional functional criteria and insurance authorization.    Follow Up Recommendations  Skilled nursing-short term rehab (<3 hours/day)     Assistance Recommended at Discharge Frequent or constant Supervision/Assistance  Patient can return home with the following  A little help with bathing/dressing/bathroom;A little help with walking and/or transfers;Assistance with cooking/housework;Direct supervision/assist for medications management;Direct supervision/assist for financial management;Assist for transportation;Help with stairs or ramp for entrance   Equipment Recommendations  Other (comment) (defer to next venue of care)    Recommendations for Other Services      Precautions / Restrictions Precautions Precautions: Fall;Other (comment) (orthostatics) Restrictions Weight Bearing Restrictions: No RUE Weight Bearing: Weight bearing as tolerated LUE Weight Bearing: Weight bearing as tolerated       Mobility Bed Mobility Overal bed mobility:  Modified Independent Bed Mobility: Supine to Sit     Supine to sit: Modified independent (Device/Increase time)     General bed mobility comments: Supervision for safety 2/2 orthostatics    Transfers Overall transfer level: Needs assistance Equipment used: None Transfers: Sit to/from Stand Sit to Stand: Supervision (2/2 orthostatics)           General transfer comment: Wearing CAM boot     Balance Overall balance assessment: Needs assistance Sitting-balance support: No upper extremity supported, Feet supported Sitting balance-Leahy Scale: Good Sitting balance - Comments: Doing well; not dizzy at EOB   Standing balance support: No upper extremity supported Standing balance-Leahy Scale: Good Standing balance comment: good until orthostatic, then assisted to sit on EOB; comes on quickly and pt does not warn therapist!                           ADL either performed or assessed with clinical judgement   ADL                                         General ADL Comments: Limited by orthostatics; otherwise appearing to be doing well with UE function today; able to use lunch tray, perform bed mobility, etc. Good standing balance until orthostatic. OT assisted with donning CAM boot on L foot.    Extremity/Trunk Assessment Upper Extremity Assessment Upper Extremity Assessment: Overall WFL for tasks assessed LUE Deficits / Details: Per prior OT note, pt now WBAT in LUE   Lower Extremity Assessment Lower Extremity Assessment: Overall WFL for tasks assessed LLE Deficits / Details: recent toe amputations; is supposed to have L CAM boot on with WB.        Vision  Perception     Praxis      Cognition Arousal/Alertness: Awake/alert Behavior During Therapy: WFL for tasks assessed/performed Overall Cognitive Status: Within Functional Limits for tasks assessed                                 General Comments: Pt was A and O and  agreeable to session.        Exercises      Shoulder Instructions       General Comments Orthostatics today: Supine 141/80 (pulse 68). Semi-reclined with HOB raised 136/81 (pulse 70). Seated EOB 108/73 (pulse 79); asymptomatic. Pt stood for approx 30 seconds with good balance, then quickly started to sway and go quiet; OT assisted pt to sit down and he reported he was dizzy; BP in sitting just after this incident 73/56 (pulse 90). Pt then able to t/f self back to supine; symptoms resolved and pt's bed moved to semi-reclined for pt to have some food (tray just arrived).    Pertinent Vitals/ Pain       Pain Assessment Pain Assessment: No/denies pain  Home Living                                          Prior Functioning/Environment              Frequency  Min 2X/week        Progress Toward Goals  OT Goals(current goals can now be found in the care plan section)  Progress towards OT goals: Progressing toward goals (limited by orthostatics)  Acute Rehab OT Goals Patient Stated Goal: stop being orthostatic OT Goal Formulation: With patient Time For Goal Achievement: 06/03/22 Potential to Achieve Goals: Good ADL Goals Pt Will Perform Upper Body Dressing: with modified independence;sitting Pt Will Perform Lower Body Dressing: with modified independence;sitting/lateral leans Pt Will Transfer to Toilet: with modified independence;bedside commode;ambulating Pt Will Perform Toileting - Clothing Manipulation and hygiene: with modified independence;sit to/from stand  Plan Discharge plan remains appropriate;Frequency remains appropriate    Co-evaluation                 AM-PAC OT "6 Clicks" Daily Activity     Outcome Measure   Help from another person eating meals?: None Help from another person taking care of personal grooming?: None Help from another person toileting, which includes using toliet, bedpan, or urinal?: A Lot Help from another person  bathing (including washing, rinsing, drying)?: A Little Help from another person to put on and taking off regular upper body clothing?: None Help from another person to put on and taking off regular lower body clothing?: A Little 6 Click Score: 20    End of Session Equipment Utilized During Treatment: Other (comment) (CAM boot on L foot)  OT Visit Diagnosis: Unsteadiness on feet (R26.81);Muscle weakness (generalized) (M62.81);Repeated falls (R29.6)   Activity Tolerance Patient tolerated treatment well;Treatment limited secondary to medical complications (Comment) (orthostatics)   Patient Left in bed;with bed alarm set;with call bell/phone within reach   Nurse Communication Mobility status        Time: [id number] OT Time Calculation (min): 25 min  Charges: OT General Charges $OT Visit: 1 Visit OT Treatments $Therapeutic Activity: 23-37 mins  Waymon Amato, MS, OTR/L   Vania Rea 05/27/2022, 4:26 PM

## 2022-05-28 LAB — CBC WITH DIFFERENTIAL/PLATELET
Abs Immature Granulocytes: 0.01 10*3/uL (ref 0.00–0.07)
Basophils Absolute: 0 10*3/uL (ref 0.0–0.1)
Basophils Relative: 1 %
Eosinophils Absolute: 0.1 10*3/uL (ref 0.0–0.5)
Eosinophils Relative: 2 %
HCT: 26.7 % — ABNORMAL LOW (ref 39.0–52.0)
Hemoglobin: 8.5 g/dL — ABNORMAL LOW (ref 13.0–17.0)
Immature Granulocytes: 0 %
Lymphocytes Relative: 38 %
Lymphs Abs: 1.1 10*3/uL (ref 0.7–4.0)
MCH: 29.4 pg (ref 26.0–34.0)
MCHC: 31.8 g/dL (ref 30.0–36.0)
MCV: 92.4 fL (ref 80.0–100.0)
Monocytes Absolute: 0.3 10*3/uL (ref 0.1–1.0)
Monocytes Relative: 10 %
Neutro Abs: 1.4 10*3/uL — ABNORMAL LOW (ref 1.7–7.7)
Neutrophils Relative %: 49 %
Platelets: 345 10*3/uL (ref 150–400)
RBC: 2.89 MIL/uL — ABNORMAL LOW (ref 4.22–5.81)
RDW: 16.6 % — ABNORMAL HIGH (ref 11.5–15.5)
WBC: 2.9 10*3/uL — ABNORMAL LOW (ref 4.0–10.5)
nRBC: 0 % (ref 0.0–0.2)

## 2022-05-28 LAB — COMPREHENSIVE METABOLIC PANEL
ALT: 49 U/L — ABNORMAL HIGH (ref 0–44)
AST: 46 U/L — ABNORMAL HIGH (ref 15–41)
Albumin: 3 g/dL — ABNORMAL LOW (ref 3.5–5.0)
Alkaline Phosphatase: 86 U/L (ref 38–126)
Anion gap: 7 (ref 5–15)
BUN: 40 mg/dL — ABNORMAL HIGH (ref 8–23)
CO2: 25 mmol/L (ref 22–32)
Calcium: 8.5 mg/dL — ABNORMAL LOW (ref 8.9–10.3)
Chloride: 101 mmol/L (ref 98–111)
Creatinine, Ser: 1.21 mg/dL (ref 0.61–1.24)
GFR, Estimated: >60 mL/min (ref >60)
Glucose, Bld: 407 mg/dL — ABNORMAL HIGH (ref 70–99)
Potassium: 4.7 mmol/L (ref 3.5–5.1)
Sodium: 133 mmol/L — ABNORMAL LOW (ref 135–145)
Total Bilirubin: 0.4 mg/dL (ref 0.3–1.2)
Total Protein: 7.7 g/dL (ref 6.5–8.1)

## 2022-05-28 LAB — MAGNESIUM: Magnesium: 1.9 mg/dL (ref 1.7–2.4)

## 2022-05-28 LAB — GLUCOSE, CAPILLARY
Glucose-Capillary: 366 mg/dL — ABNORMAL HIGH (ref 70–99)
Glucose-Capillary: 457 mg/dL — ABNORMAL HIGH (ref 70–99)
Glucose-Capillary: 395 mg/dL — ABNORMAL HIGH (ref 70–99)

## 2022-05-28 LAB — PHOSPHORUS: Phosphorus: 3 mg/dL (ref 2.5–4.6)

## 2022-05-28 MED ORDER — HYDROCODONE-ACETAMINOPHEN 5-325 MG PO TABS: 1.0000 | ORAL_TABLET | Freq: Four times a day (QID) | ORAL | 0 refills | Status: DC | PRN

## 2022-05-28 MED ORDER — FLUDROCORTISONE ACETATE 0.1 MG PO TABS
0.2000 mg | ORAL_TABLET | Freq: Every day | ORAL | Status: DC
  Administered 2022-05-28: 0.2 mg via ORAL
  Filled 2022-05-28: qty 2

## 2022-05-28 MED ORDER — MIDODRINE HCL 5 MG PO TABS: 15.0000 mg | ORAL_TABLET | Freq: Three times a day (TID) | ORAL | Status: DC

## 2022-05-28 MED ORDER — INSULIN ASPART 100 UNIT/ML IJ SOLN
4.0000 [IU] | Freq: Once | INTRAMUSCULAR | Status: AC
  Administered 2022-05-28: 4 [IU] via SUBCUTANEOUS
  Filled 2022-05-28: qty 1

## 2022-05-28 MED ORDER — FLUDROCORTISONE ACETATE 0.1 MG PO TABS: 0.2000 mg | ORAL_TABLET | Freq: Every day | ORAL | Status: DC

## 2022-05-28 NOTE — TOC Transition Note (Signed)
Transition of Care [facility] - CM/SW Discharge Note   Patient Details  Name: Nicholas Weiss MRN: [id number] Date of Birth: Aug 26, 1950  Transition of Care [facility]) CM/SW Contact:  Gerilyn Pilgrim, LCSW Phone Number: 05/28/2022, 2:14 PM   Clinical Narrative:  Pt has orders in to discharge to [facility] in [place]. RN given number for report. Medical Necessity printed CSW will call [facility] once patient is ready.      Final next level of care: Skilled Nursing Facility Barriers to Discharge: Barriers Resolved   Patient Goals and CMS Choice CMS Medicare.gov Compare Post Acute Care list provided to:: Patient    Discharge Placement                  Patient to be transferred to facility by: [facility]      Discharge Plan and Services Additional resources added to the After Visit Summary for                                       Social Determinants of Health (SDOH) Interventions SDOH Screenings   Food Insecurity: No Food Insecurity (05/15/2022)  Housing: Low Risk  (05/15/2022)  Transportation Needs: No Transportation Needs (05/15/2022)  Utilities: Not At Risk (05/15/2022)  Alcohol Screen: Low Risk  (10/02/2020)  Depression (PHQ2-9): Medium Risk (10/02/2020)  Financial Resource Strain: Low Risk  (01/01/2020)  Physical Activity: Insufficiently Active (01/01/2020)  Social Connections: Moderately Isolated (01/01/2020)  Stress: No Stress Concern Present (01/01/2020)  Tobacco Use: Low Risk  (05/15/2022)     Readmission Risk Interventions    05/20/2022   11:41 AM 03/12/2022   10:22 AM 02/25/2022    2:43 PM  Readmission Risk Prevention Plan  Transportation Screening Complete Complete Complete  Medication Review Press photographer) Complete Complete Complete  PCP or Specialist appointment within 3-5 days of discharge Complete Complete Complete  HRI or Home Care Consult Complete Complete Complete  SW Recovery Care/Counseling Consult  Complete   Palliative Care Screening  Complete Not Applicable Not [facility] Complete Not Applicable Not Applicable

## 2022-05-28 NOTE — TOC Progression Note (Addendum)
Transition of Care [facility]) - Progression Note    Patient Details  Name: Nicholas Weiss MRN: [id number] Date of Birth: 1951/03/14  Transition of Care [facility]) CM/SW Contact  Gerilyn Pilgrim, LCSW Phone Number: 05/28/2022, 1:48 PM  Clinical Narrative:   Message sent to [facility] on if we are able to start auth for them today. Pt initially had a bed and auth but it expired. CSW will wait for return call and start auth once received.        2:09pm Per christine at facility pt is in grace period for auth and can admit today.  Expected Discharge Plan and Services                                               Social Determinants of Health (SDOH) Interventions SDOH Screenings   Food Insecurity: No Food Insecurity (05/15/2022)  Housing: Low Risk  (05/15/2022)  Transportation Needs: No Transportation Needs (05/15/2022)  Utilities: Not At Risk (05/15/2022)  Alcohol Screen: Low Risk  (10/02/2020)  Depression (PHQ2-9): Medium Risk (10/02/2020)  Financial Resource Strain: Low Risk  (01/01/2020)  Physical Activity: Insufficiently Active (01/01/2020)  Social Connections: Moderately Isolated (01/01/2020)  Stress: No Stress Concern Present (01/01/2020)  Tobacco Use: Low Risk  (05/15/2022)    Readmission Risk Interventions    05/20/2022   11:41 AM 03/12/2022   10:22 AM 02/25/2022    2:43 PM  Readmission Risk Prevention Plan  Transportation Screening Complete Complete Complete  Medication Review Press photographer) Complete Complete Complete  PCP or Specialist appointment within 3-5 days of discharge Complete Complete Complete  HRI or Home Care Consult Complete Complete Complete  SW Recovery Care/Counseling Consult  Complete   Palliative Care Screening Complete Not Applicable Not [place] Complete Not Applicable Not Applicable

## 2022-05-28 NOTE — Progress Notes (Signed)
Report given to [place], Therapist, sports at [facility]. Will assist with getting patient ready. EMS to pick patient up.

## 2022-05-28 NOTE — Care Management Important Message (Signed)
Important Message  Patient Details  Name: Nicholas Weiss MRN: [id number] Date of Birth: [birth year]   Medicare Important Message Given:  Yes     [name] 05/28/2022, 2:23 PM

## 2022-05-28 NOTE — Progress Notes (Signed)
PROGRESS NOTE    Nicholas Weiss  [id number] DOB: Jun 15, 1950 DOA: 05/14/2022 PCP: Dion Body, MD   Brief Narrative:  Patient is a 72 year old African-American male with a past medical history significant for but #2 diabetes mellitus type 1 complicated with hypoglycemia, hypertension, history of DKA as well as other comorbidities who has a history of multiple falls who presented to the ED with a complaint of a fall almost 2 weeks ago.  He has had multiple admissions for DKA in the past and he was found to be hypertensive in the emergency room on presentation.  He was evaluated with a CT scan of his head which showed a subdural hematoma 7 mm in size and neurosurgery evaluated and recommended Keppra for prophylaxis for 7 days.  Repeat head CT was stable and he was admitted to stepdown unit given his confusion on admission with elevated blood pressure.  He was put on nicardipine drip which was discontinued on 05/16/2022 blood pressure remained stable.  He developed hyperkalemia which was then improved with IV fluid hydration.  Current Hospital course is as below:  2/17 : Patient seen and examined this morning.  No overnight events.  Back to baseline.  Had an episode of hypoglycemia with blood glucose of 20.  Repleted with improvement in mental status.  Continued with D10 half-normal saline for now.  Had hyperkalemia with potassium of 5.6 ordered Lokelma.  Insulin dose decreased to 6 units nightly with corrective scale with meals.   2/18 : Patient seen and examined this morning no overnight events.  Had early morning hypoglycemia managed with hypoglycemia protocol.  Patient morning glucose in the range of 120s to 150s.  Patient did not receive his Lantus last night.  Reports that he takes [name] 15 units in the morning daily with no issues.  However patient has been having multiple falls likely secondary to hypoglycemia will need adjustment of insulin and endocrinology follow-up in the outpatient  setting.  Patient presenting symptoms have resolved however needs optimization of insulin prior to discharge.   2/19: Patient is seen and examined at the bedside.  He is awake and alert and is oriented to person and place but not to time.   2/20 : Patient is seen and examined at bedside.  Eating breakfast and appears comfortable and in no distress.   2/21: Remains orthostatic and symptomatic 2/22: Remains orthostatic.  Less so than yesterday. 2/23: Remains orthostatic 2/24: Still symptomatic, dizzy when standing.  Insurance authorization iniated 2/25: Hypotension yesterday 2/26: Remains orthostatic 2/27: Orthostatic symptoms seem to be worsening.  This despite escalation of medication 2/28: Remains persistently orthostatic and for unclear reasons did not have an abdominal binder or TED hose today.   2/29: Orthostatic symptoms started to improve.  Patient has been on intravenous fluids, midodrine 15 3 times daily, Florinef increased to 20 mg daily, abdominal binder, TED hose.   Assessment and Plan:  Falls Orthostatic hypotension -Pt sustained head injury and ct showed SDH. -CT showed "Small right frontal subdural hematoma measuring 7 mm in thickness with mild mass effect upon the subjacent right frontal cortical gyri. No midline shift." -Repeat Head CT done and showed "Unchanged small subdural hematoma along the right frontal convexity, measuring up to 7 mm. Unchanged mild mass effect on the right frontal lobe, without significant midline shift." -Evaluated by PT and noted to have orthostatic blood pressure changes which could have contributed to his frequent falls Plan: Continue midodrine 15 mg 3 times daily Increase Florinef to 0.2  mg daily Discontinue IV fluids Abdominal binder and compression stockings when ambulating There is not much further we can offer for symptoms of persistent orthostasis.  If symptoms persist despite above aggressive interventions may need evaluation by  cardiology    HyperKalemia  Hyperkalemia to 5.6 noted.  This is resolved.  Back in reference range   Diabetes Mellitus Type 1 -Patient is on oral hypoglycemic agent along with [name].   -On admission patient was placed on corrective scale insulin along with Lantus 7 units.    -Has had issues with hypoglycemia and labile sugars Sugar very elevated on 2/24.   -Per RN patient was found with an empty large bag of Cheetos by bedside. Continue Semglee 10 units daily -Very sensitive sliding scale -No nightly coverage -Educated on dietary discretion though patient continues to eat outside food -CBGs ranging from 149-258, occasional elevations in the 400s when patient does not adhere to dietary restrictions    Cocaine abuse  -High risk for IC bleed.  -Patient has been counseled on the need to abstain from further cocaine use   Hypertensive urgency -Pt started on nicardipine drip for hypertensive urgency.   -Has been off since 02/17 Noted to have orthostatic blood pressure changes and started on midodrine -BP reasonably well-controlled   Shoulder pain, left -Cont with prn pain meds and fall precaution. Attribute to fall.  -PT/OT to further evaluate and Treat   Subdural Hematoma  -Continue keppra per neurosurgery recommendation to complete a 7-day course of therapy -Neurosurgery felt that it was a small traumatic subdural hematoma right-sided but he felt he is clinically stable with no surgical intervention needed.   -Per neurosurgery they are recommending repeating CT scan within a month but if he has a change in his neurological condition they are recommending obtaining additional CT scan and contacting the neurosurgical provider on-call for further recommendations   AKI on CKD Stage 3a Back to baseline.  Discontinue IV fluids  Chronic hyponatremia Stable and mild.  Breast to be asymptomatic.  133 on 2/29   Anemia of chronic kidney failure, stage 3 (moderate)  -Attribute history  of multiple myeloma (no ER evaluation). Appears relatively stable No signs of blood loss  Osteomyelitis -Status post recent amputation of left great toe and left partial second toe amputation Continue Augmentin to complete treatment course   DVT prophylaxis: Place TED hose Start: 05/27/22 1616 SCDs Start: 05/15/22 0103    Code Status: Full Code Family Communication: No family currently at bedside  Disposition Plan:  Level of care: Telemetry Medical Status is: Inpatient Remains inpatient appropriate because: Remains persistently orthostatic and will need further clinical improvement prior to safe discharge disposition to SNF   Consultants:  Neurosurgery  Procedures:  As delineated as above  Antimicrobials:  Anti-infectives (From admission, onward)    Start     Dose/Rate Route Frequency Ordered Stop   05/15/22 1130  amoxicillin-clavulanate (AUGMENTIN) 875-125 MG per tablet 1 tablet        1 tablet Oral Every 12 hours 05/15/22 1056 05/23/22 2104       Subjective: Seen and examined.  Seems relatively asymptomatic this morning.  No complaints  Objective: Vitals:   05/27/22 1628 05/27/22 2213 05/28/22 0636 05/28/22 0737  BP: (!) 163/89 (!) 154/82 (!) 160/93 (!) 163/95  Pulse: 71 72 73 75  Resp: '16 19 18 18  '$ Temp: 97.6 F (36.4 C) 98.7 F (37.1 C) 98.6 F (37 C) 98.4 F (36.9 C)  TempSrc:  SpO2: 100% 99% 100% 100%  Weight:      Height:        Intake/Output Summary (Last 24 hours) at 05/28/2022 1337 Last data filed at 05/28/2022 1100 Gross per 24 hour  Intake 680.45 ml  Output 1225 ml  Net -544.55 ml   Filed Weights   05/25/22 0233 05/26/22 0238 05/27/22 0607  Weight: 67.2 kg 66.4 kg 65.1 kg   Examination: Physical Exam:  Constitutional: WN/WD chronically ill-appearing African-American male currently no acute distress Respiratory: Diminished to auscultation bilaterally, no wheezing, rales, rhonchi or crackles. Normal respiratory effort and patient is not  tachypenic. No accessory muscle use.  Unlabored breathing Cardiovascular: RRR, no murmurs / rubs / gallops. S1 and S2 auscultated. No extremity edema.  Abdomen: Soft, non-tender, non-distended.  Bowel sounds positive.  GU: Deferred. Musculoskeletal: No clubbing / cyanosis of digits/nails. No joint deformity upper and lower extremities. Skin: No rashes, lesions, ulcers on limited skin evaluation. No induration; Warm and dry.  Neurologic: He is a little sleepy and had just woken up but has no acute focal deficits and cranial nerves II through XII grossly intact. Psychiatric: Normal judgment and insight.  He was little somnolent and drowsy but easily arousable.  Data Reviewed: I have personally reviewed following labs and imaging studies  CBC: Recent Labs  Lab 05/22/22 0726 05/24/22 0817 05/27/22 1024 05/28/22 0339  WBC 2.7* 3.0* 2.8* 2.9*  NEUTROABS 1.2* 1.4* 1.2* 1.4*  HGB 8.0* 8.3* 8.8* 8.5*  HCT 24.8* 25.7* 27.6* 26.7*  MCV 91.5 92.1 92.3 92.4  PLT 355 361 369 [id number]   Basic Metabolic Panel: Recent Labs  Lab 05/22/22 0726 05/24/22 0817 05/27/22 1024 05/28/22 0339  NA 131* 133* 133* 133*  K 4.5 4.9 4.5 4.7  CL 102 101 101 101  CO2 '22 25 25 25  '$ GLUCOSE 305* 213* 238* 407*  BUN 30* 35* 41* 40*  CREATININE 1.12 1.45* 1.31* 1.21  CALCIUM 8.2* 8.8* 8.9 8.5*  MG  --   --  2.1 1.9  PHOS  --   --  3.3 3.0   GFR: Estimated Creatinine Clearance: 53.1 mL/min (by C-G formula based on SCr of 1.21 mg/dL). Liver Function Tests: Recent Labs  Lab 05/27/22 1024 05/28/22 0339  AST 48* 46*  ALT 49* 49*  ALKPHOS 83 86  BILITOT 0.6 0.4  PROT 8.0 7.7  ALBUMIN 3.2* 3.0*   No results for input(s): "LIPASE", "AMYLASE" in the last 168 hours. No results for input(s): "AMMONIA" in the last 168 hours. Coagulation Profile: No results for input(s): "INR", "PROTIME" in the last 168 hours. Cardiac Enzymes: No results for input(s): "CKTOTAL", "CKMB", "CKMBINDEX", "TROPONINI" in the last 168  hours. BNP (last 3 results) No results for input(s): "PROBNP" in the last 8760 hours. HbA1C: No results for input(s): "HGBA1C" in the last 72 hours. CBG: Recent Labs  Lab 05/27/22 1623 05/27/22 2215 05/28/22 0739 05/28/22 1105 05/28/22 1229  GLUCAP 258* 233* 366* 457* 395*   Lipid Profile: No results for input(s): "CHOL", "HDL", "LDLCALC", "TRIG", "CHOLHDL", "LDLDIRECT" in the last 72 hours. Thyroid Function Tests: No results for input(s): "TSH", "T4TOTAL", "FREET4", "T3FREE", "THYROIDAB" in the last 72 hours. Anemia Panel: No results for input(s): "VITAMINB12", "FOLATE", "FERRITIN", "TIBC", "IRON", "RETICCTPCT" in the last 72 hours. Sepsis Labs: No results for input(s): "PROCALCITON", "LATICACIDVEN" in the last 168 hours.  No results found for this or any previous visit (from the past 240 hour(s)).   Radiology Studies: No results found.  Scheduled Meds:  ascorbic  acid  500 mg Oral Daily   feeding supplement (GLUCERNA SHAKE)  237 mL Oral TID BM   fludrocortisone  0.2 mg Oral Daily   insulin aspart  0-6 Units Subcutaneous TID WC   insulin detemir  10 Units Subcutaneous Daily   insulin detemir  3 Units Subcutaneous Once   midodrine  15 mg Oral TID WC   multivitamin with minerals  1 tablet Oral Daily   pantoprazole  40 mg Oral BID   sodium chloride flush  3 mL Intravenous Q12H   thiamine  100 mg Oral Daily   Continuous Infusions:  sodium chloride Stopped (05/16/22 1457)   sodium chloride Stopped (05/28/22 0448)    LOS: 13 days  Ralene Muskrat MD [facility] Available via Epic secure chat 7am-7pm After these hours, please refer to coverage provider listed on [email or web address] 05/28/2022, 1:37 PM

## 2022-05-28 NOTE — Discharge Summary (Signed)
Physician Discharge Summary  Nicholas Weiss [id number] DOB: 29-Apr-1950 DOA: 05/14/2022  PCP: Dion Body, MD  Admit date: 05/14/2022 Discharge date: 05/28/2022  Admitted From: Home Disposition:  SNF  Recommendations for Outpatient Follow-up:  Follow up with PCP in 1-2 weeks   Home Health:No Equipment/Devices: Thigh-high compression stockings, abdominal binder  Discharge Condition:Stab  CODE STATUS: Full Diet recommendation: Carb modified  Brief/Interim Summary: Patient is a 72 year old African-American male with a past medical history significant for but #2 diabetes mellitus type 1 complicated with hypoglycemia, hypertension, history of DKA as well as other comorbidities who has a history of multiple falls who presented to the ED with a complaint of a fall almost 2 weeks ago.  He has had multiple admissions for DKA in the past and he was found to be hypertensive in the emergency room on presentation.  He was evaluated with a CT scan of his head which showed a subdural hematoma 7 mm in size and neurosurgery evaluated and recommended Keppra for prophylaxis for 7 days.  Repeat head CT was stable and he was admitted to stepdown unit given his confusion on admission with elevated blood pressure.  He was put on nicardipine drip which was discontinued on 05/16/2022 blood pressure remained stable.  He developed hyperkalemia which was then improved with IV fluid hydration.  Current Hospital course is as below:   2/17 : Patient seen and examined this morning.  No overnight events.  Back to baseline.  Had an episode of hypoglycemia with blood glucose of 20.  Repleted with improvement in mental status.  Continued with D10 half-normal saline for now.  Had hyperkalemia with potassium of 5.6 ordered Lokelma.  Insulin dose decreased to 6 units nightly with corrective scale with meals.   2/18 : Patient seen and examined this morning no overnight events.  Had early morning hypoglycemia managed with  hypoglycemia protocol.  Patient morning glucose in the range of 120s to 150s.  Patient did not receive his Lantus last night.  Reports that he takes [name] 15 units in the morning daily with no issues.  However patient has been having multiple falls likely secondary to hypoglycemia will need adjustment of insulin and endocrinology follow-up in the outpatient setting.  Patient presenting symptoms have resolved however needs optimization of insulin prior to discharge.   2/19: Patient is seen and examined at the bedside.  He is awake and alert and is oriented to person and place but not to time.   2/20 : Patient is seen and examined at bedside.  Eating breakfast and appears comfortable and in no distress.   2/21: Remains orthostatic and symptomatic 2/22: Remains orthostatic.  Less so than yesterday. 2/23: Remains orthostatic 2/24: Still symptomatic, dizzy when standing.  Insurance authorization iniated 2/25: Hypotension yesterday 2/26: Remains orthostatic 2/27: Orthostatic symptoms seem to be worsening.  This despite escalation of medication 2/28: Remains persistently orthostatic and for unclear reasons did not have an abdominal binder or TED hose today.   2/29: Orthostatic symptoms started to improve.  Patient has been on intravenous fluids, midodrine 15 3 times daily, Florinef increased to 20 mg daily, abdominal binder, TED hose.  Appropriate for discharge to skilled nursing facility.    Discharge Diagnoses:  Principal Problem:   Falls Active Problems:   Acute renal failure superimposed on stage 3a chronic kidney disease (HCC)   Essential (primary) hypertension   Cocaine abuse ([place])   History of substance abuse ([place])   Hypertensive urgency   Anemia of chronic kidney  failure, stage 3 (moderate) (HCC)   Chronic hyponatremia   Benign prostatic hyperplasia with nocturia   Subdural hematoma (HCC)   Shoulder pain, left Falls Orthostatic hypotension -Pt sustained head injury and ct showed  SDH. -CT showed "Small right frontal subdural hematoma measuring 7 mm in thickness with mild mass effect upon the subjacent right frontal cortical gyri. No midline shift." -Repeat Head CT done and showed "Unchanged small subdural hematoma along the right frontal convexity, measuring up to 7 mm. Unchanged mild mass effect on the right frontal lobe, without significant midline shift." -Evaluated by PT and noted to have orthostatic blood pressure changes which could have contributed to his frequent falls Plan: Continue midodrine 15 mg 3 times daily Increase Florinef to 0.2 mg daily Abdominal binder and compression stockings when ambulating Patient maxed out on medical therapy.  Stable for discharge to skilled nursing facility.  If symptoms of orthostasis recur may need referral to cardiology for further evaluation     HyperKalemia  Hyperkalemia to 5.6 noted.  This is resolved.  Back in reference range   Diabetes Mellitus Type 1 -Patient is on oral hypoglycemic agent along with [name].   -On admission patient was placed on corrective scale insulin along with Lantus 7 units.    -Has had issues with hypoglycemia and labile sugars Sugar very elevated on 2/24.   -Per RN patient was found with an empty large bag of Cheetos by bedside. Home medication regimen resumed at time of discharge.  Needs education on importance of dietary adherence.    Cocaine abuse  -High risk for IC bleed.  -Patient has been counseled on the need to abstain from further cocaine use   Hypertensive urgency -Pt started on nicardipine drip for hypertensive urgency.   -Has been off since 02/17 Noted to have orthostatic blood pressure changes and started on midodrine -BP reasonably well-controlled.  Home blood pressure medications stopped on discharge.  Suspicion of hypertensive urgency triggered by cocaine use   Shoulder pain, left -Cont with prn pain meds and fall precaution. Attribute to fall.     Subdural Hematoma   -Continue keppra per neurosurgery recommendation to complete a 7-day course of therapy -Neurosurgery felt that it was a small traumatic subdural hematoma right-sided but he felt he is clinically stable with no surgical intervention needed.   -Per neurosurgery they are recommending repeating CT scan within a month but if he has a change in his neurological condition they are recommending obtaining additional CT scan and contacting the neurosurgical provider on-call for further recommendations -Patient neurologically intact at time of discharge.  Keppra course completed.   AKI on CKD Stage 3a Back to baseline.    Chronic hyponatremia Stable and mild.  Felt to be asymptomatic.  133 on 2/29   Anemia of chronic kidney failure, stage 3 (moderate)  -Attribute history of multiple myeloma (no ER evaluation). Appears relatively stable No signs of blood loss   Osteomyelitis -Status post recent amputation of left great toe and left partial second toe amputation Continue Augmentin to complete treatment course, completed course in house.  Antibiotics discontinued at time of discharge.   Discharge Instructions  Discharge Instructions     Ambulatory referral to Nutrition and Diabetic Education   Complete by: As directed    Diet - low sodium heart healthy   Complete by: As directed    Increase activity slowly   Complete by: As directed    No wound care   Complete by: As directed  Allergies as of 05/28/2022       Reactions   Penicillins Other (See Comments)   Childhood allergy -tolerated amoxil 03/2022 Unknown reaction Has patient had a PCN reaction causing immediate rash, facial/tongue/throat swelling, SOB or lightheadedness with hypotension: No Has patient had a PCN reaction causing severe rash involving mucus membranes or skin necrosis: No Has patient had a PCN reaction that required hospitalization No Has patient had a PCN reaction occurring within the last 10 years: No If all of  the above answers are "NO", then may proceed with Cephalosporin use.        Medication List     STOP taking these medications    amoxicillin-clavulanate 875-125 MG tablet Commonly known as: AUGMENTIN   gabapentin 300 MG capsule Commonly known as: NEURONTIN   lisinopril 2.5 MG tablet Commonly known as: ZESTRIL   losartan 25 MG tablet Commonly known as: COZAAR       TAKE these medications    acetaminophen 500 MG tablet Commonly known as: TYLENOL Take 1,000 mg by mouth daily as needed for mild pain or moderate pain.   acyclovir 200 MG capsule Commonly known as: ZOVIRAX Take 2 capsules (400 mg total) by mouth 2 (two) times daily.   ascorbic acid 500 MG tablet Commonly known as: VITAMIN C Take 1 tablet (500 mg total) by mouth daily.   aspirin EC 81 MG tablet Take 81 mg by mouth in the morning.   atorvastatin 80 MG tablet Commonly known as: LIPITOR Take 1 tablet (80 mg total) by mouth at bedtime. Hold while taking Paxlovid   Baclofen 5 MG Tabs Take 5 mg by mouth daily as needed (muscle spasm).   clopidogrel 75 MG tablet Commonly known as: PLAVIX Take 75 mg by mouth in the morning.   docusate sodium 100 MG capsule Commonly known as: COLACE Take 100 mg by mouth daily as needed for mild constipation.   Farxiga 10 MG Tabs tablet Generic drug: dapagliflozin propanediol Take 10 mg by mouth in the morning.   feeding supplement (GLUCERNA SHAKE) Liqd Take 237 mLs by mouth 3 (three) times daily between meals.   ferrous sulfate 325 (65 FE) MG tablet Take 1 tablet (325 mg total) by mouth daily with breakfast.   fludrocortisone 0.1 MG tablet Commonly known as: FLORINEF Take 2 tablets (0.2 mg total) by mouth daily. Start taking on: May 29, 2022   guaiFENesin-dextromethorphan 100-10 MG/5ML syrup Commonly known as: ROBITUSSIN DM Take 10 mLs by mouth every 4 (four) hours as needed for cough.   HYDROcodone-acetaminophen 5-325 MG tablet Commonly known as:  NORCO/VICODIN Take 1 tablet by mouth every 6 (six) hours as needed for severe pain.   insulin lispro 100 UNIT/ML injection Commonly known as: HUMALOG Inject into the skin 3 (three) times daily before meals.   midodrine 5 MG tablet Commonly known as: PROAMATINE Take 3 tablets (15 mg total) by mouth 3 (three) times daily with meals.   multivitamin with minerals Tabs tablet Take 1 tablet by mouth daily.   NovoLOG 100 UNIT/ML injection Generic drug: insulin aspart Inject 0-9 Units into the skin 3 (three) times daily with meals.   ondansetron 8 MG tablet Commonly known as: ZOFRAN Take 8 mg by mouth daily as needed for nausea or vomiting.   tamsulosin 0.4 MG Caps capsule Commonly known as: FLOMAX Take 0.4 mg by mouth daily.   [name] FlexTouch 100 UNIT/ML FlexTouch Pen Generic drug: insulin degludec Inject 15 Units into the skin daily. What changed: how much  to take   zinc sulfate 220 (50 Zn) MG capsule Take 1 capsule (220 mg total) by mouth daily.        Contact information for follow-up providers     Schedule an appointment as soon as possible for a visit  with Meade Maw, MD.   Specialty: Neurosurgery Contact information: [address] [place] [address] [phone]         Schedule an appointment as soon as possible for a visit  with Dion Body, MD.   Specialty: Family Medicine Contact information: [place] [address] [phone]         Dion Body, MD.   Specialty: Family Medicine Contact information: [place] [address] [phone]              Contact information for after-discharge care     [facility] .   Service: Skilled Nursing Contact information: [address] [place] [address] [phone]                    Allergies  Allergen  Reactions   Penicillins Other (See Comments)    Childhood allergy -tolerated amoxil 03/2022 Unknown reaction Has patient had a PCN reaction causing immediate rash, facial/tongue/throat swelling, SOB or lightheadedness with hypotension: No Has patient had a PCN reaction causing severe rash involving mucus membranes or skin necrosis: No Has patient had a PCN reaction that required hospitalization No Has patient had a PCN reaction occurring within the last 10 years: No If all of the above answers are "NO", then may proceed with Cephalosporin use.     Consultations: None   Procedures/Studies: CT Head Wo Contrast  Result Date: 05/15/2022 CLINICAL DATA:  Subdural hematoma follow-up EXAM: CT HEAD WITHOUT CONTRAST TECHNIQUE: Contiguous axial images were obtained from the base of the skull through the vertex without intravenous contrast. RADIATION DOSE REDUCTION: This exam was performed according to the departmental dose-optimization program which includes automated exposure control, adjustment of the mA and/or kV according to patient size and/or use of iterative reconstruction technique. COMPARISON:  05/14/2022 FINDINGS: Brain: Redemonstrated small subdural hematoma along the right frontal convexity, measuring up to 7 mm, unchanged. Unchanged mild mass effect on the right frontal lobe. No significant midline shift. No acute infarct, parenchymal hemorrhage, or mass. No hydrocephalus. Cerebral volume is within normal limits for age. Vascular: No hyperdense vessel. Skull: Negative for fracture or focal lesion. Sinuses/Orbits: No acute finding. Other: The mastoid air cells are well aerated. IMPRESSION: Unchanged small subdural hematoma along the right frontal convexity, measuring up to 7 mm. Unchanged mild mass effect on the right frontal lobe, without significant midline shift. Electronically Signed   By: Merilyn Baba M.D.   On: 05/15/2022 03:13   DG Humerus Left  Result Date: 05/15/2022 CLINICAL DATA:   Status post fall. EXAM: LEFT HUMERUS - 2+ VIEW COMPARISON:  None Available. FINDINGS: An area of cortical irregularity of indeterminate age is seen along the greater tubercle of the left humeral head. There is no evidence of dislocation. Soft tissues are unremarkable. IMPRESSION: Findings which may represent a fracture deformity of indeterminate age along the greater tubercle of the left humeral head. Correlation with physical examination is recommended to exclude the presence of point tenderness. Electronically Signed   By: Virgina Norfolk M.D.   On: 05/15/2022 01:15   DG Shoulder Left  Result Date: 05/15/2022  CLINICAL DATA:  Status post fall. EXAM: LEFT SHOULDER - 2+ VIEW COMPARISON:  None Available. FINDINGS: A cortical deformity of indeterminate age is seen involving the greater tubercle of the left humeral head. There is no evidence of dislocation. Mild to moderate severity degenerative changes are seen involving the left acromioclavicular joint and left glenohumeral articulation. Soft tissues are unremarkable. IMPRESSION: Findings which may represent a fracture deformity of indeterminate age involving the greater tubercle of the left humeral head. Correlation with physical examination is recommended to determine the presence of point tenderness. Electronically Signed   By: Virgina Norfolk M.D.   On: 05/15/2022 01:14   DG Knee 1-2 Views Left  Result Date: 05/14/2022 CLINICAL DATA:  144615 Pain 144615 EXAM: RIGHT KNEE - 2 VIEW; LEFT KNEE - 2 VIEW COMPARISON:  None Available. FINDINGS: No evidence of fracture, dislocation, or joint effusion. There is bilateral tricompartmental joint space narrowing, sclerosis and osteophytes consistent with degenerative joint disease. Small suprapatellar effusions. IMPRESSION: Degenerative changes. Effusions.  No acute osseous abnormalities Electronically Signed   By: Sammie Bench M.D.   On: 05/14/2022 21:50   DG Knee 1-2 Views Right  Result Date:  05/14/2022 CLINICAL DATA:  144615 Pain 144615 EXAM: RIGHT KNEE - 2 VIEW; LEFT KNEE - 2 VIEW COMPARISON:  None Available. FINDINGS: No evidence of fracture, dislocation, or joint effusion. There is bilateral tricompartmental joint space narrowing, sclerosis and osteophytes consistent with degenerative joint disease. Small suprapatellar effusions. IMPRESSION: Degenerative changes. Effusions.  No acute osseous abnormalities Electronically Signed   By: Sammie Bench M.D.   On: 05/14/2022 21:50   CT Head Wo Contrast  Addendum Date: 05/14/2022   ADDENDUM REPORT: 05/14/2022 21:40 ADDENDUM: These results were called by telephone at the time of interpretation on 05/14/2022 at 9:40 pm to provider [facility] , who verbally acknowledged these results. Electronically Signed   By: Fidela Salisbury M.D.   On: 05/14/2022 21:40   Result Date: 05/14/2022 CLINICAL DATA:  Head trauma, minor (Age >= 65y) Mental status change, unknown cause EXAM: CT HEAD WITHOUT CONTRAST TECHNIQUE: Contiguous axial images were obtained from the base of the skull through the vertex without intravenous contrast. RADIATION DOSE REDUCTION: This exam was performed according to the departmental dose-optimization program which includes automated exposure control, adjustment of the mA and/or kV according to patient size and/or use of iterative reconstruction technique. COMPARISON:  02/17/2022 FINDINGS: Brain: Small right subdural hematoma noted along the right frontal convexity measuring 7 mm in thickness with mild mass effect upon the subjacent right frontal cortical gyri. No midline shift. Parenchymal volume loss is commensurate with the patient's age. Stable mild periventricular white matter changes are present likely reflecting the sequela of small vessel ischemia. No acute infarct. Ventricular size is normal. Cerebellum unremarkable. Vascular: No asymmetric hyperdense vasculature at the skull base. Skull: Intact Sinuses/Orbits: Paranasal sinuses are  clear. Orbits are unremarkable. Other: Mastoid air cells and middle ear cavities are clear. IMPRESSION: 1. Small right frontal subdural hematoma measuring 7 mm in thickness with mild mass effect upon the subjacent right frontal cortical gyri. No midline shift. 2. Stable mild senescent change. Electronically Signed: By: Fidela Salisbury M.D. On: 05/14/2022 21:34   DG Foot Complete Left  Result Date: 05/05/2022 Please see detailed radiograph report in office note.     Subjective: Seen and examined on the day of discharge.  Relatively stable.  Less orthostatic than prior assessments  Discharge Exam: Vitals:   05/28/22 0636 05/28/22 0737  BP: (!) 160/93 Marland Kitchen)  163/95  Pulse: 73 75  Resp: 18 18  Temp: 98.6 F (37 C) 98.4 F (36.9 C)  SpO2: 100% 100%   Vitals:   05/27/22 1628 05/27/22 2213 05/28/22 0636 05/28/22 0737  BP: (!) 163/89 (!) 154/82 (!) 160/93 (!) 163/95  Pulse: 71 72 73 75  Resp: '16 19 18 18  '$ Temp: 97.6 F (36.4 C) 98.7 F (37.1 C) 98.6 F (37 C) 98.4 F (36.9 C)  TempSrc:      SpO2: 100% 99% 100% 100%  Weight:      Height:        General: Pt is alert, awake, not in acute distress Cardiovascular: RRR, S1/S2 +, no rubs, no gallops Respiratory: CTA bilaterally, no wheezing, no rhonchi Abdominal: Soft, NT, ND, bowel sounds + Extremities: no edema, no cyanosis    The results of significant diagnostics from this hospitalization (including imaging, microbiology, ancillary and laboratory) are listed below for reference.     Microbiology: No results found for this or any previous visit (from the past 240 hour(s)).   Labs: BNP (last 3 results) No results for input(s): "BNP" in the last 8760 hours. Basic Metabolic Panel: Recent Labs  Lab 05/22/22 0726 05/24/22 0817 05/27/22 1024 05/28/22 0339  NA 131* 133* 133* 133*  K 4.5 4.9 4.5 4.7  CL 102 101 101 101  CO2 '22 25 25 25  '$ GLUCOSE 305* 213* 238* 407*  BUN 30* 35* 41* 40*  CREATININE 1.12 1.45* 1.31* 1.21   CALCIUM 8.2* 8.8* 8.9 8.5*  MG  --   --  2.1 1.9  PHOS  --   --  3.3 3.0   Liver Function Tests: Recent Labs  Lab 05/27/22 1024 05/28/22 0339  AST 48* 46*  ALT 49* 49*  ALKPHOS 83 86  BILITOT 0.6 0.4  PROT 8.0 7.7  ALBUMIN 3.2* 3.0*   No results for input(s): "LIPASE", "AMYLASE" in the last 168 hours. No results for input(s): "AMMONIA" in the last 168 hours. CBC: Recent Labs  Lab 05/22/22 0726 05/24/22 0817 05/27/22 1024 05/28/22 0339  WBC 2.7* 3.0* 2.8* 2.9*  NEUTROABS 1.2* 1.4* 1.2* 1.4*  HGB 8.0* 8.3* 8.8* 8.5*  HCT 24.8* 25.7* 27.6* 26.7*  MCV 91.5 92.1 92.3 92.4  PLT 355 361 369 345   Cardiac Enzymes: No results for input(s): "CKTOTAL", "CKMB", "CKMBINDEX", "TROPONINI" in the last 168 hours. BNP: Invalid input(s): "POCBNP" CBG: Recent Labs  Lab 05/27/22 1623 05/27/22 2215 05/28/22 0739 05/28/22 1105 05/28/22 1229  GLUCAP 258* 233* 366* 457* 395*   D-Dimer No results for input(s): "DDIMER" in the last 72 hours. Hgb A1c No results for input(s): "HGBA1C" in the last 72 hours. Lipid Profile No results for input(s): "CHOL", "HDL", "LDLCALC", "TRIG", "CHOLHDL", "LDLDIRECT" in the last 72 hours. Thyroid function studies No results for input(s): "TSH", "T4TOTAL", "T3FREE", "THYROIDAB" in the last 72 hours.  Invalid input(s): "FREET3" Anemia work up No results for input(s): "VITAMINB12", "FOLATE", "FERRITIN", "TIBC", "IRON", "RETICCTPCT" in the last 72 hours. Urinalysis    Component Value Date/Time   COLORURINE YELLOW (A) 04/21/2022 1636   APPEARANCEUR HAZY (A) 04/21/2022 1636   LABSPEC 1.014 04/21/2022 1636   PHURINE 5.0 04/21/2022 1636   GLUCOSEU >=500 (A) 04/21/2022 1636   HGBUR NEGATIVE 04/21/2022 1636   BILIRUBINUR NEGATIVE 04/21/2022 1636   KETONESUR 5 (A) 04/21/2022 1636   PROTEINUR NEGATIVE 04/21/2022 1636   NITRITE NEGATIVE 04/21/2022 1636   LEUKOCYTESUR NEGATIVE 04/21/2022 1636   Sepsis Labs Recent Labs  Lab 05/22/22 0726  05/24/22 0817 05/27/22 1024 05/28/22 0339  WBC 2.7* 3.0* 2.8* 2.9*   Microbiology No results found for this or any previous visit (from the past 240 hour(s)).   Time coordinating discharge: Over 30 minutes  SIGNED:   Sidney Ace, MD  [facility] 05/28/2022, 2:12 PM Pager   If 7PM-7AM, please contact night-coverage

## 2022-05-28 NOTE — Inpatient Diabetes Management (Signed)
Inpatient Diabetes Program Recommendations  AACE/ADA: New Consensus Statement on Inpatient Glycemic Control (2015)  Target Ranges:  Prepandial:   less than 140 mg/dL      Peak postprandial:   less than 180 mg/dL (1-2 hours)      Critically ill patients:  140 - 180 mg/dL   Lab Results  Component Value Date   GLUCAP 366 (H) 05/28/2022   HGBA1C 9.1 (H) 05/20/2022    Review of Glycemic Control  Latest Reference Range & Units 05/27/22 08:00 05/27/22 11:53 05/27/22 16:23 05/27/22 22:15 05/28/22 07:39  Glucose-Capillary 70 - 99 mg/dL 172 (H) 225 (H) 258 (H) 233 (H) 366 (H)  (H): Data is abnormally high  Diabetes history: DM1 Outpatient Diabetes medications: Tresiba 20 units daily, Novolog 0-9 units TID, Farxiga 10 mg daily Current orders for Inpatient glycemic control: Levemir 10 units QAM, Novolog 0-6 units TID  Inpatient Diabetes Program Recommendations:    Might consider:  Levemir 12 units QAM Novolog 2 units TID with meals if he consumes at least 50%  Will continue to follow while inpatient.  Thank you, Reche Dixon, MSN, [place] Diabetes Coordinator Inpatient Diabetes Program [phone] (team pager from 8a-5p)

## 2022-05-28 NOTE — Progress Notes (Signed)
Physical Therapy Treatment Patient Details Name: Nicholas Weiss MRN: [id number] DOB: [birth year] Today's Date: 05/28/2022   History of Present Illness Pt is a 72 yo M who presented to ER secondary to recurrent falls (related to hypoglycemia?); admitted for management of SDH (52m, stable) and acute fracture to L humerus/greater tubercle.  MD assessment also includes: orthostatic hypotension, cocaine abuse, hyperkalemia, acute renal failure superimposed on stage 3a chronic kidney disease, anemia of chronic kidney failure, L foot osteomyelitis s/p recent amputation of left great toe and left partial second toe amputation.    PT Comments    Pt seen for PT tx with pt asleep but awakened & agreeable to tx. Pt is able to complete bed mobility with mod I, STS with supervision, but requires min<>mod assist for safety with stand pivot bed>recliner 2/2 anterior lean/LOB. Pt only tolerates standing ~2 minutes 2/2 pt reporting fatigue. Encouraged pt to sit in recliner vs return to bed at end of session. MD & nurse notified of pt's BP readings.  BP checked in LUE: In bed: 112/79 mmHg MAP 88 Sitting EOB: 136/115 mmHg MAP 119 Unable to obtain BP in standing or upon return to sitting EOB, BP sitting in recliner: 123/108 mmHg MAP 114     Recommendations for follow up therapy are one component of a multi-disciplinary discharge planning process, led by the attending physician.  Recommendations may be updated based on patient status, additional functional criteria and insurance authorization.  Follow Up Recommendations  Skilled nursing-short term rehab (<3 hours/day) Can patient physically be transported by private vehicle: Yes   Assistance Recommended at Discharge Frequent or constant Supervision/Assistance  Patient can return home with the following A little help with bathing/dressing/bathroom;Assist for transportation;A lot of help with walking and/or transfers   Equipment Recommendations  None  recommended by PT (TBD in next venue)    Recommendations for Other Services       Precautions / Restrictions Precautions Precautions: Fall Restrictions Weight Bearing Restrictions: Yes LUE Weight Bearing: Weight bearing as tolerated LLE Weight Bearing: Weight bearing as tolerated (in cam boot)     Mobility  Bed Mobility Overal bed mobility: Modified Independent Bed Mobility: Supine to Sit     Supine to sit: Modified independent (Device/Increase time), HOB elevated          Transfers Overall transfer level: Needs assistance Equipment used: None Transfers: Sit to/from Stand, Bed to chair/wheelchair/BSC Sit to Stand: Supervision   Step pivot transfers: Min assist, Mod assist (Pt with anterior lean, requiring min<>mod assist for upright posture/balance & cuing to turn to recliner.)            Ambulation/Gait                   Stairs             Wheelchair Mobility    Modified Rankin (Stroke Patients Only)       Balance Overall balance assessment: Needs assistance Sitting-balance support: No upper extremity supported, Feet supported Sitting balance-Leahy Scale: Good Sitting balance - Comments: Pt able to don LLE cam boot sitting EOB without LOB   Standing balance support: No upper extremity supported Standing balance-Leahy Scale: Fair Standing balance comment: CGA<>close supervision static standing EOB, min assist for stand pivot bed>recliner                            Cognition Arousal/Alertness: Awake/alert Behavior During Therapy: WFL for tasks assessed/performed Overall Cognitive Status:  Within Functional Limits for tasks assessed                                 General Comments: Slightly irritable throughout session, requires encouragement to sit in recliner vs return to bed at end of session.        Exercises      General Comments General comments (skin integrity, edema, etc.): ted hose donned on RLE       Pertinent Vitals/Pain Pain Assessment Pain Assessment: Faces Faces Pain Scale: No hurt    Home Living                          Prior Function            PT Goals (current goals can now be found in the care plan section) Acute Rehab PT Goals Patient Stated Goal: rehab then home PT Goal Formulation: With patient Time For Goal Achievement: 06/02/22 Potential to Achieve Goals: Good Progress towards PT goals: Progressing toward goals    Frequency    Min 2X/week      PT Plan Current plan remains appropriate    Co-evaluation              AM-PAC PT "6 Clicks" Mobility   Outcome Measure  Help needed turning from your back to your side while in a flat bed without using bedrails?: None Help needed moving from lying on your back to sitting on the side of a flat bed without using bedrails?: None Help needed moving to and from a bed to a chair (including a wheelchair)?: A Little Help needed standing up from a chair using your arms (e.g., wheelchair or bedside chair)?: A Little Help needed to walk in hospital room?: A Little Help needed climbing 3-5 steps with a railing? : A Lot 6 Click Score: 19    End of Session   Activity Tolerance:  (limited by BP) Patient left: in chair;with call bell/phone within reach;with chair alarm set Nurse Communication:  (BP (MD made aware as well)) PT Visit Diagnosis: Other abnormalities of gait and mobility (R26.89);Unsteadiness on feet (R26.81);Muscle weakness (generalized) (M62.81)     Time: [id number] PT Time Calculation (min) (ACUTE ONLY): 15 min  Charges:  $Therapeutic Activity: 8-22 mins                     Lavone Nian, PT, DPT 05/28/22, 12:14 PM  Waunita Schooner 05/28/2022, 12:13 PM

## 2022-06-01 ENCOUNTER — Other Ambulatory Visit: Payer: 59

## 2022-06-01 ENCOUNTER — Inpatient Hospital Stay (HOSPITAL_BASED_OUTPATIENT_CLINIC_OR_DEPARTMENT_OTHER): Payer: 59 | Admitting: Oncology

## 2022-06-01 ENCOUNTER — Inpatient Hospital Stay: Payer: 59

## 2022-06-01 ENCOUNTER — Ambulatory Visit: Payer: 59 | Admitting: Oncology

## 2022-06-01 ENCOUNTER — Ambulatory Visit: Payer: 59

## 2022-06-01 ENCOUNTER — Inpatient Hospital Stay: Payer: 59 | Attending: Oncology

## 2022-06-01 ENCOUNTER — Encounter: Payer: Self-pay | Admitting: Oncology

## 2022-06-01 VITALS — BP 156/93 | HR 77 | Temp 94.8°F | Resp 16 | Ht 70.0 in | Wt 144.2 lb

## 2022-06-01 DIAGNOSIS — Z794 Long term (current) use of insulin: Secondary | ICD-10-CM | POA: Diagnosis not present

## 2022-06-01 DIAGNOSIS — Z79899 Other long term (current) drug therapy: Secondary | ICD-10-CM | POA: Insufficient documentation

## 2022-06-01 DIAGNOSIS — N183 Chronic kidney disease, stage 3 unspecified: Secondary | ICD-10-CM | POA: Insufficient documentation

## 2022-06-01 DIAGNOSIS — C9000 Multiple myeloma not having achieved remission: Secondary | ICD-10-CM

## 2022-06-01 DIAGNOSIS — D649 Anemia, unspecified: Secondary | ICD-10-CM

## 2022-06-01 DIAGNOSIS — D631 Anemia in chronic kidney disease: Secondary | ICD-10-CM

## 2022-06-01 DIAGNOSIS — Z7902 Long term (current) use of antithrombotics/antiplatelets: Secondary | ICD-10-CM | POA: Diagnosis not present

## 2022-06-01 DIAGNOSIS — E1022 Type 1 diabetes mellitus with diabetic chronic kidney disease: Secondary | ICD-10-CM | POA: Diagnosis not present

## 2022-06-01 DIAGNOSIS — Z7189 Other specified counseling: Secondary | ICD-10-CM

## 2022-06-01 DIAGNOSIS — I129 Hypertensive chronic kidney disease with stage 1 through stage 4 chronic kidney disease, or unspecified chronic kidney disease: Secondary | ICD-10-CM | POA: Insufficient documentation

## 2022-06-01 LAB — CBC WITH DIFFERENTIAL/PLATELET
Abs Immature Granulocytes: 0.01 10*3/uL (ref 0.00–0.07)
Basophils Absolute: 0 10*3/uL (ref 0.0–0.1)
Basophils Relative: 0 %
Eosinophils Absolute: 0 10*3/uL (ref 0.0–0.5)
Eosinophils Relative: 1 %
HCT: 26.3 % — ABNORMAL LOW (ref 39.0–52.0)
Hemoglobin: 8.3 g/dL — ABNORMAL LOW (ref 13.0–17.0)
Immature Granulocytes: 0 %
Lymphocytes Relative: 25 %
Lymphs Abs: 0.8 10*3/uL (ref 0.7–4.0)
MCH: 30 pg (ref 26.0–34.0)
MCHC: 31.6 g/dL (ref 30.0–36.0)
MCV: 94.9 fL (ref 80.0–100.0)
Monocytes Absolute: 0.3 10*3/uL (ref 0.1–1.0)
Monocytes Relative: 8 %
Neutro Abs: 2.2 10*3/uL (ref 1.7–7.7)
Neutrophils Relative %: 66 %
Platelets: 262 10*3/uL (ref 150–400)
RBC: 2.77 MIL/uL — ABNORMAL LOW (ref 4.22–5.81)
RDW: 16.4 % — ABNORMAL HIGH (ref 11.5–15.5)
WBC: 3.3 10*3/uL — ABNORMAL LOW (ref 4.0–10.5)
nRBC: 0 % (ref 0.0–0.2)

## 2022-06-01 NOTE — Progress Notes (Signed)
Hematology/Oncology Consult note [facility]  [phone] [phone]  Patient Care Team: Dion Body, MD as PCP - General (Family Medicine) [place], [place] (Optometry) Sindy Guadeloupe, MD as Consulting Physician (Oncology)   Name of the patient: Nicholas Weiss  [id number]  07-02-1950   Date of visit: 06/01/22  Diagnosis- high risk IgG lambda multiple myeloma R-ISS stage III      Chief complaint/ Reason for visit-posthospital discharge follow-up  Heme/Onc history:  Patient is a 72 year old African-American male with a past medical history significant for uncontrolled type 1 diabetes, chronic kidney disease stage III chronic normocytic anemia referred for abnormal SPEP. Patient's creatinine has been fluctuating between 1.5-2 but about 5 months ago it went up all the way to 5 and his blood sugars were in the 1000 range. More recently his kidney numbers have been drifting back to normal values. As a part of the work-up he had serum protein electrophoresis done which showed an elevated gammaglobulin fraction with an M spike of 3%. The amount of M spike was not quantified in the specimen. He has therefore been referred to [place] for further management. Patient endorses chronic fatigue reports that his appetite and weight have remained stable   Results of blood work from 04/08/2020 were as follows: CBC showed white count of 2.9, H&H of 10.5/31.2 with an MCV of 91 and a platelet count of 191.  CMP showed a mildly elevated creatinine of 1.2 which was better as compared to 5 months ago when it was 3.9.  Total protein was mildly elevated at 9.1 calcium normal at 8.4 ferritin and iron studies B12 folate TSH and haptoglobin were normal.  Myeloma panel revealed an elevated IgG level of 06/04/2004 with an M protein of 3.1 g.  Immunofixation showed IgG lambda specificity.  Serum free light chain ratio was elevated at 25 and free light chain lambda elevated at  370   Further myeloma work-up including a PET CT scan did not reveal any evidence of edematous lesions.  Bone marrow biopsy showed 23% plasma cells by manual count and 30% by CD138 IHC.  Normal cytogenetics.  FISH studies for myeloma showed gain of 1 q.  13 q-. detected.  P53 not detected.   He was treated for a year and has having smoldering multiple myeloma since both CKD and anemia have been stable for 3 to 4 years and could be secondary to uncontrolled diabetes.In June 2023 IgG levels increased to 5463 with an M protein of 4.1 g as compared to 2.6 g in September 2022.  Serum free light chain ratio remains around 25.  Therefore a repeat bone marrow biopsy was done which shows further increase in plasma cells ranging from 30 to 70% and by immunohistochemistry 60 to 70% of the cells were positive for CD138.  Cytogenetics normal.  FISH study showed 4: 14 translocation and gain of 1 q. making this high risk RISS stage III.  Repeat PET scan showed no lytic lesions   Patient is not a transplant candidate and was started on Velcade Revlimid dexamethasone regimen in July 2023.  Treatment complicated by repeated hospitalizations for uncontrolled blood sugars.  Interval history-patient has not received any treatment for over 2 months now.  He was admitted to the hospital for over 2 weeks for subdural hematoma following a fall, hypertensive urgency.  His blood sugars as well as blood pressures have been labile and difficult to control while he was in the hospital.  He is currently at a rehab facility  ECOG PS- 3 Pain scale- 2 Opioid associated constipation- no  Review of systems- Review of Systems  Constitutional:  Positive for malaise/fatigue. Negative for chills, fever and weight loss.  HENT:  Negative for congestion, ear discharge and nosebleeds.   Eyes:  Negative for blurred vision.  Respiratory:  Negative for cough, hemoptysis, sputum production, shortness of breath and wheezing.   Cardiovascular:   Negative for chest pain, palpitations, orthopnea and claudication.  Gastrointestinal:  Negative for abdominal pain, blood in stool, constipation, diarrhea, heartburn, melena, nausea and vomiting.  Genitourinary:  Negative for dysuria, flank pain, frequency, hematuria and urgency.  Musculoskeletal:  Negative for back pain, joint pain and myalgias.  Skin:  Negative for rash.  Neurological:  Negative for dizziness, tingling, focal weakness, seizures, weakness and headaches.  Endo/Heme/Allergies:  Does not bruise/bleed easily.  Psychiatric/Behavioral:  Negative for depression and suicidal ideas. The patient does not have insomnia.       Allergies  Allergen Reactions   Penicillins Other (See Comments)    Childhood allergy -tolerated amoxil 03/2022 Unknown reaction Has patient had a PCN reaction causing immediate rash, facial/tongue/throat swelling, SOB or lightheadedness with hypotension: No Has patient had a PCN reaction causing severe rash involving mucus membranes or skin necrosis: No Has patient had a PCN reaction that required hospitalization No Has patient had a PCN reaction occurring within the last 10 years: No If all of the above answers are "NO", then may proceed with Cephalosporin use.      Past Medical History:  Diagnosis Date   Acute metabolic encephalopathy [id number]   DKA (diabetic ketoacidoses) 04/06/2016   Hypercholesteremia    Hypertension      Past Surgical History:  Procedure Laterality Date   AMPUTATION TOE Left 04/25/2022   Procedure: LEFT GREAT TOE AMPUTATION AND LEFT PARTIAL 2ND TOE AMPUTATION;  Surgeon: Edrick Kins, DPM;  Location: [facility] ORS;  Service: Podiatry;  Laterality: Left;   COLONOSCOPY WITH PROPOFOL N/A 02/01/2020   Procedure: COLONOSCOPY WITH PROPOFOL;  Surgeon: Lin Landsman, MD;  Location: [facility] ENDOSCOPY;  Service: Gastroenterology;  Laterality: N/A;   ESOPHAGOGASTRODUODENOSCOPY  02/01/2020   Procedure: ESOPHAGOGASTRODUODENOSCOPY (EGD);   Surgeon: Lin Landsman, MD;  Location: [facility] ENDOSCOPY;  Service: Gastroenterology;;   KNEE SURGERY Right    Torn meniscus   KNEE SURGERY Left     Social History   Socioeconomic History   Marital status: Single    Spouse name: Not on file   Number of children: 3   Years of education: Not on file   Highest education level: High school graduate  Occupational History    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  Tobacco Use   Smoking status: Never   Smokeless tobacco: Never  Vaping Use   Vaping Use: Never used  Substance and Sexual Activity   Alcohol use: Not Currently    Alcohol/week: 0.0 - 1.0 standard drinks of alcohol    Comment: "once every 2 months"   Drug use: Yes    Types: Marijuana, "Crack" cocaine    Comment: last week   Sexual activity: Not Currently    Birth control/protection: None  Other Topics Concern   Not on file  Social History Narrative   Lives with girlfriend "sharon"   Social Determinants of Health   Financial Resource Strain: Low Risk  (01/01/2020)   Overall Financial Resource Strain (CARDIA)    Difficulty of Paying Living Expenses: Not hard at all  Food Insecurity: No  Food Insecurity (05/15/2022)   Hunger Vital Sign    Worried About Running Out of Food in the Last Year: Never true    Ran Out of Food in the Last Year: Never true  Transportation Needs: No Transportation Needs (05/15/2022)   PRAPARE - Hydrologist (Medical): No    Lack of Transportation (Non-Medical): No  Physical Activity: Insufficiently Active (01/01/2020)   Exercise Vital Sign    Days of Exercise per Week: 7 days    Minutes of Exercise per Session: 20 min  Stress: No Stress Concern Present (01/01/2020)   [place]    Feeling of Stress : Not at all  Social Connections: Moderately Isolated (01/01/2020)   Social Connection and Isolation Panel [NHANES]    Frequency of Communication with Friends  and Family: More than three times a week    Frequency of Social Gatherings with Friends and Family: More than three times a week    Attends Religious Services: Never    Marine scientist or Organizations: No    Attends Archivist Meetings: Never    Marital Status: Living with partner  Intimate Partner Violence: Not At Risk (05/15/2022)   Humiliation, Afraid, Rape, and Kick questionnaire    Fear of Current or Ex-Partner: No    Emotionally Abused: No    Physically Abused: No    Sexually Abused: No    Family History  Problem Relation Age of Onset   Heart attack Father    Hypertension Sister    Cancer Sister      Current Outpatient Medications:    acetaminophen (TYLENOL) 500 MG tablet, Take 1,000 mg by mouth daily as needed for mild pain or moderate pain., Disp: , Rfl:    acyclovir (ZOVIRAX) 200 MG capsule, Take 2 capsules (400 mg total) by mouth 2 (two) times daily., Disp: 60 capsule, Rfl: 1   ascorbic acid (VITAMIN C) 500 MG tablet, Take 1 tablet (500 mg total) by mouth daily., Disp: 90 tablet, Rfl: 0   aspirin EC 81 MG tablet, Take 81 mg by mouth in the morning., Disp: , Rfl:    atorvastatin (LIPITOR) 80 MG tablet, Take 1 tablet (80 mg total) by mouth at bedtime. Hold while taking Paxlovid, Disp: 30 tablet, Rfl: 2   Baclofen 5 MG TABS, Take 5 mg by mouth daily as needed (muscle spasm)., Disp: , Rfl:    clopidogrel (PLAVIX) 75 MG tablet, Take 75 mg by mouth in the morning., Disp: , Rfl:    docusate sodium (COLACE) 100 MG capsule, Take 100 mg by mouth daily as needed for mild constipation., Disp: , Rfl:    FARXIGA 10 MG TABS tablet, Take 10 mg by mouth in the morning., Disp: , Rfl:    feeding supplement, GLUCERNA SHAKE, (GLUCERNA SHAKE) LIQD, Take 237 mLs by mouth 3 (three) times daily between meals., Disp: , Rfl: 0   ferrous sulfate 325 (65 FE) MG tablet, Take 1 tablet (325 mg total) by mouth daily with breakfast., Disp: 90 tablet, Rfl: 3   fludrocortisone (FLORINEF) 0.1  MG tablet, Take 2 tablets (0.2 mg total) by mouth daily., Disp: , Rfl:    guaiFENesin-dextromethorphan (ROBITUSSIN DM) 100-10 MG/5ML syrup, Take 10 mLs by mouth every 4 (four) hours as needed for cough., Disp: 118 mL, Rfl: 0   HYDROcodone-acetaminophen (NORCO/VICODIN) 5-325 MG tablet, Take 1 tablet by mouth every 6 (six) hours as needed for severe pain., Disp: 8  tablet, Rfl: 0   insulin degludec (TRESIBA FLEXTOUCH) 100 UNIT/ML FlexTouch Pen, Inject 15 Units into the skin daily. (Patient taking differently: Inject 20 Units into the skin daily.), Disp: , Rfl:    insulin lispro (HUMALOG) 100 UNIT/ML injection, Inject into the skin 3 (three) times daily before meals., Disp: , Rfl:    midodrine (PROAMATINE) 5 MG tablet, Take 3 tablets (15 mg total) by mouth 3 (three) times daily with meals., Disp: , Rfl:    Multiple Vitamin (MULTIVITAMIN WITH MINERALS) TABS tablet, Take 1 tablet by mouth daily., Disp: 90 tablet, Rfl: 1   NOVOLOG 100 UNIT/ML injection, Inject 0-9 Units into the skin 3 (three) times daily with meals., Disp: 10 mL, Rfl: 1   ondansetron (ZOFRAN) 8 MG tablet, Take 8 mg by mouth daily as needed for nausea or vomiting., Disp: , Rfl:    tamsulosin (FLOMAX) 0.4 MG CAPS capsule, Take 0.4 mg by mouth daily., Disp: , Rfl:    zinc sulfate 220 (50 Zn) MG capsule, Take 1 capsule (220 mg total) by mouth daily., Disp: 90 capsule, Rfl: 0  Physical exam:  Vitals:   06/01/22 0907 06/01/22 0913  BP: (!) 156/95 (!) 156/93  Pulse: 77 77  Resp: 16   Temp: (!) 94.8 F (34.9 C)   TempSrc: Tympanic   SpO2: 100%   Weight: 144 lb 3.2 oz (65.4 kg)   Height: '5\' 10"'$  (1.778 m)    Physical Exam Constitutional:      Comments: Sitting in a wheelchair.  Appears fatigued  Cardiovascular:     Rate and Rhythm: Normal rate and regular rhythm.     Heart sounds: Normal heart sounds.  Pulmonary:     Effort: Pulmonary effort is normal.     Breath sounds: Normal breath sounds.  Abdominal:     General: Bowel sounds  are normal.     Palpations: Abdomen is soft.  Musculoskeletal:     Comments: Unna boot in place over left lower extremity  Skin:    General: Skin is warm and dry.  Neurological:     Mental Status: He is alert and oriented to person, place, and time.         Latest Ref Rng & Units 05/28/2022    3:39 AM  CMP  Glucose 70 - 99 mg/dL 407   BUN 8 - 23 mg/dL 40   Creatinine 0.61 - 1.24 mg/dL 1.21   Sodium 135 - 145 mmol/L 133   Potassium 3.5 - 5.1 mmol/L 4.7   Chloride 98 - 111 mmol/L 101   CO2 22 - 32 mmol/L 25   Calcium 8.9 - 10.3 mg/dL 8.5   Total Protein 6.5 - 8.1 g/dL 7.7   Total Bilirubin 0.3 - 1.2 mg/dL 0.4   Alkaline Phos 38 - 126 U/L 86   AST 15 - 41 U/L 46   ALT 0 - 44 U/L 49       Latest Ref Rng & Units 06/01/2022    8:50 AM  CBC  WBC 4.0 - 10.5 K/uL 3.3   Hemoglobin 13.0 - 17.0 g/dL 8.3   Hematocrit 39.0 - 52.0 % 26.3   Platelets 150 - 400 K/uL 262     No images are attached to the encounter.  CT Head Wo Contrast  Result Date: 05/15/2022 CLINICAL DATA:  Subdural hematoma follow-up EXAM: CT HEAD WITHOUT CONTRAST TECHNIQUE: Contiguous axial images were obtained from the base of the skull through the vertex without intravenous contrast. RADIATION DOSE REDUCTION: This  exam was performed according to the departmental dose-optimization program which includes automated exposure control, adjustment of the mA and/or kV according to patient size and/or use of iterative reconstruction technique. COMPARISON:  05/14/2022 FINDINGS: Brain: Redemonstrated small subdural hematoma along the right frontal convexity, measuring up to 7 mm, unchanged. Unchanged mild mass effect on the right frontal lobe. No significant midline shift. No acute infarct, parenchymal hemorrhage, or mass. No hydrocephalus. Cerebral volume is within normal limits for age. Vascular: No hyperdense vessel. Skull: Negative for fracture or focal lesion. Sinuses/Orbits: No acute finding. Other: The mastoid air cells are  well aerated. IMPRESSION: Unchanged small subdural hematoma along the right frontal convexity, measuring up to 7 mm. Unchanged mild mass effect on the right frontal lobe, without significant midline shift. Electronically Signed   By: Merilyn Baba M.D.   On: 05/15/2022 03:13   DG Humerus Left  Result Date: 05/15/2022 CLINICAL DATA:  Status post fall. EXAM: LEFT HUMERUS - 2+ VIEW COMPARISON:  None Available. FINDINGS: An area of cortical irregularity of indeterminate age is seen along the greater tubercle of the left humeral head. There is no evidence of dislocation. Soft tissues are unremarkable. IMPRESSION: Findings which may represent a fracture deformity of indeterminate age along the greater tubercle of the left humeral head. Correlation with physical examination is recommended to exclude the presence of point tenderness. Electronically Signed   By: Virgina Norfolk M.D.   On: 05/15/2022 01:15   DG Shoulder Left  Result Date: 05/15/2022 CLINICAL DATA:  Status post fall. EXAM: LEFT SHOULDER - 2+ VIEW COMPARISON:  None Available. FINDINGS: A cortical deformity of indeterminate age is seen involving the greater tubercle of the left humeral head. There is no evidence of dislocation. Mild to moderate severity degenerative changes are seen involving the left acromioclavicular joint and left glenohumeral articulation. Soft tissues are unremarkable. IMPRESSION: Findings which may represent a fracture deformity of indeterminate age involving the greater tubercle of the left humeral head. Correlation with physical examination is recommended to determine the presence of point tenderness. Electronically Signed   By: Virgina Norfolk M.D.   On: 05/15/2022 01:14   DG Knee 1-2 Views Left  Result Date: 05/14/2022 CLINICAL DATA:  144615 Pain 144615 EXAM: RIGHT KNEE - 2 VIEW; LEFT KNEE - 2 VIEW COMPARISON:  None Available. FINDINGS: No evidence of fracture, dislocation, or joint effusion. There is bilateral  tricompartmental joint space narrowing, sclerosis and osteophytes consistent with degenerative joint disease. Small suprapatellar effusions. IMPRESSION: Degenerative changes. Effusions.  No acute osseous abnormalities Electronically Signed   By: Sammie Bench M.D.   On: 05/14/2022 21:50   DG Knee 1-2 Views Right  Result Date: 05/14/2022 CLINICAL DATA:  144615 Pain 144615 EXAM: RIGHT KNEE - 2 VIEW; LEFT KNEE - 2 VIEW COMPARISON:  None Available. FINDINGS: No evidence of fracture, dislocation, or joint effusion. There is bilateral tricompartmental joint space narrowing, sclerosis and osteophytes consistent with degenerative joint disease. Small suprapatellar effusions. IMPRESSION: Degenerative changes. Effusions.  No acute osseous abnormalities Electronically Signed   By: Sammie Bench M.D.   On: 05/14/2022 21:50   CT Head Wo Contrast  Addendum Date: 05/14/2022   ADDENDUM REPORT: 05/14/2022 21:40 ADDENDUM: These results were called by telephone at the time of interpretation on 05/14/2022 at 9:40 pm to provider [facility] , who verbally acknowledged these results. Electronically Signed   By: Fidela Salisbury M.D.   On: 05/14/2022 21:40   Result Date: 05/14/2022 CLINICAL DATA:  Head trauma, minor (Age >=  65y) Mental status change, unknown cause EXAM: CT HEAD WITHOUT CONTRAST TECHNIQUE: Contiguous axial images were obtained from the base of the skull through the vertex without intravenous contrast. RADIATION DOSE REDUCTION: This exam was performed according to the departmental dose-optimization program which includes automated exposure control, adjustment of the mA and/or kV according to patient size and/or use of iterative reconstruction technique. COMPARISON:  02/17/2022 FINDINGS: Brain: Small right subdural hematoma noted along the right frontal convexity measuring 7 mm in thickness with mild mass effect upon the subjacent right frontal cortical gyri. No midline shift. Parenchymal volume loss is  commensurate with the patient's age. Stable mild periventricular white matter changes are present likely reflecting the sequela of small vessel ischemia. No acute infarct. Ventricular size is normal. Cerebellum unremarkable. Vascular: No asymmetric hyperdense vasculature at the skull base. Skull: Intact Sinuses/Orbits: Paranasal sinuses are clear. Orbits are unremarkable. Other: Mastoid air cells and middle ear cavities are clear. IMPRESSION: 1. Small right frontal subdural hematoma measuring 7 mm in thickness with mild mass effect upon the subjacent right frontal cortical gyri. No midline shift. 2. Stable mild senescent change. Electronically Signed: By: Fidela Salisbury M.D. On: 05/14/2022 21:34   DG Foot Complete Left  Result Date: 05/05/2022 Please see detailed radiograph report in office note.    Assessment and plan- Patient is a 72 y.o. male with R-ISS stage III high risk IgG lambda multiple myeloma.   He is s/p 7 cycles of VRD chemotherapy and is here for a posthospital discharge follow-up  Patient's M protein when checked about 2 months ago had plateaued at 1.6 g.  My plan was to add Darzalex to VRD regimen.  However he was hospitalized for labile blood sugars as well as blood pressure.  He has not received treatment for over 2 months now and is presently at a rehab facility.  I will wait until his rehab duration is over and tentatively see him back in about 2-1/2 weeks with repeat labs CBC with differential CMP myeloma panel and serum free light chains.  He will receive VRD hemotherapy on that day along with Retacrit.  Depending on his tolerance and performance status I will consider adding Darzalex for the following week   Visit Diagnosis 1. Goals of care, counseling/discussion   2. Multiple myeloma not having achieved remission ([place])   3. Normocytic anemia   4. Anemia of chronic kidney failure, stage 3 (moderate) (HCC)      Dr. Randa Evens, MD, MPH [facility] [id number] 06/01/2022 12:33 PM

## 2022-06-01 NOTE — Progress Notes (Signed)
No concerns for the provider today.

## 2022-06-03 ENCOUNTER — Other Ambulatory Visit: Payer: Self-pay

## 2022-06-05 ENCOUNTER — Telehealth: Payer: Self-pay | Admitting: *Deleted

## 2022-06-05 NOTE — Telephone Encounter (Signed)
[place] (Zach-wound care nurse at facility)is calling for a verbal order to remove sutures or will they be ok until upcoming appointment. The patient missed his last visit,was in the hospital. Please advise.

## 2022-06-06 ENCOUNTER — Inpatient Hospital Stay: Payer: Self-pay

## 2022-06-06 ENCOUNTER — Other Ambulatory Visit: Payer: Self-pay

## 2022-06-06 ENCOUNTER — Encounter (HOSPITAL_COMMUNITY): Payer: Self-pay | Admitting: Emergency Medicine

## 2022-06-06 ENCOUNTER — Inpatient Hospital Stay (HOSPITAL_COMMUNITY)
Admission: EM | Admit: 2022-06-06 | Discharge: 2022-06-12 | DRG: 391 | Disposition: A | Discharge status: Home Health | Payer: 59 | Source: Skilled Nursing Facility | Attending: Internal Medicine | Admitting: Internal Medicine

## 2022-06-06 DIAGNOSIS — Z880 Allergy status to penicillin: Secondary | ICD-10-CM

## 2022-06-06 DIAGNOSIS — N1831 Chronic kidney disease, stage 3a: Secondary | ICD-10-CM | POA: Diagnosis present

## 2022-06-06 DIAGNOSIS — I951 Orthostatic hypotension: Secondary | ICD-10-CM | POA: Diagnosis present

## 2022-06-06 DIAGNOSIS — Z8673 Personal history of transient ischemic attack (TIA), and cerebral infarction without residual deficits: Secondary | ICD-10-CM | POA: Diagnosis not present

## 2022-06-06 DIAGNOSIS — D62 Acute posthemorrhagic anemia: Secondary | ICD-10-CM | POA: Diagnosis not present

## 2022-06-06 DIAGNOSIS — I129 Hypertensive chronic kidney disease with stage 1 through stage 4 chronic kidney disease, or unspecified chronic kidney disease: Secondary | ICD-10-CM | POA: Diagnosis present

## 2022-06-06 DIAGNOSIS — Z7902 Long term (current) use of antithrombotics/antiplatelets: Secondary | ICD-10-CM

## 2022-06-06 DIAGNOSIS — E1040 Type 1 diabetes mellitus with diabetic neuropathy, unspecified: Secondary | ICD-10-CM | POA: Diagnosis present

## 2022-06-06 DIAGNOSIS — K922 Gastrointestinal hemorrhage, unspecified: Secondary | ICD-10-CM | POA: Diagnosis present

## 2022-06-06 DIAGNOSIS — Z8619 Personal history of other infectious and parasitic diseases: Secondary | ICD-10-CM

## 2022-06-06 DIAGNOSIS — K279 Peptic ulcer, site unspecified, unspecified as acute or chronic, without hemorrhage or perforation: Secondary | ICD-10-CM | POA: Diagnosis not present

## 2022-06-06 DIAGNOSIS — E1110 Type 2 diabetes mellitus with ketoacidosis without coma: Secondary | ICD-10-CM | POA: Diagnosis present

## 2022-06-06 DIAGNOSIS — Z89412 Acquired absence of left great toe: Secondary | ICD-10-CM | POA: Diagnosis not present

## 2022-06-06 DIAGNOSIS — Z8249 Family history of ischemic heart disease and other diseases of the circulatory system: Secondary | ICD-10-CM

## 2022-06-06 DIAGNOSIS — Z7982 Long term (current) use of aspirin: Secondary | ICD-10-CM

## 2022-06-06 DIAGNOSIS — I959 Hypotension, unspecified: Secondary | ICD-10-CM

## 2022-06-06 DIAGNOSIS — E1022 Type 1 diabetes mellitus with diabetic chronic kidney disease: Secondary | ICD-10-CM | POA: Diagnosis present

## 2022-06-06 DIAGNOSIS — N183 Chronic kidney disease, stage 3 unspecified: Secondary | ICD-10-CM | POA: Diagnosis not present

## 2022-06-06 DIAGNOSIS — C9000 Multiple myeloma not having achieved remission: Secondary | ICD-10-CM | POA: Diagnosis present

## 2022-06-06 DIAGNOSIS — K3189 Other diseases of stomach and duodenum: Principal | ICD-10-CM | POA: Diagnosis present

## 2022-06-06 DIAGNOSIS — E1010 Type 1 diabetes mellitus with ketoacidosis without coma: Secondary | ICD-10-CM | POA: Diagnosis present

## 2022-06-06 DIAGNOSIS — N179 Acute kidney failure, unspecified: Secondary | ICD-10-CM | POA: Diagnosis present

## 2022-06-06 DIAGNOSIS — Z89422 Acquired absence of other left toe(s): Secondary | ICD-10-CM | POA: Diagnosis not present

## 2022-06-06 DIAGNOSIS — Z7984 Long term (current) use of oral hypoglycemic drugs: Secondary | ICD-10-CM

## 2022-06-06 DIAGNOSIS — E7800 Pure hypercholesterolemia, unspecified: Secondary | ICD-10-CM | POA: Diagnosis present

## 2022-06-06 DIAGNOSIS — Z79899 Other long term (current) drug therapy: Secondary | ICD-10-CM

## 2022-06-06 DIAGNOSIS — E440 Moderate protein-calorie malnutrition: Secondary | ICD-10-CM | POA: Diagnosis present

## 2022-06-06 DIAGNOSIS — Z794 Long term (current) use of insulin: Secondary | ICD-10-CM

## 2022-06-06 DIAGNOSIS — I160 Hypertensive urgency: Secondary | ICD-10-CM | POA: Diagnosis present

## 2022-06-06 DIAGNOSIS — D631 Anemia in chronic kidney disease: Secondary | ICD-10-CM | POA: Diagnosis not present

## 2022-06-06 DIAGNOSIS — Z6820 Body mass index (BMI) 20.0-20.9, adult: Secondary | ICD-10-CM

## 2022-06-06 DIAGNOSIS — E876 Hypokalemia: Secondary | ICD-10-CM | POA: Diagnosis present

## 2022-06-06 LAB — URINALYSIS, ROUTINE W REFLEX MICROSCOPIC
Bilirubin Urine: NEGATIVE
Glucose, UA: >=500 mg/dL — AB
Hgb urine dipstick: NEGATIVE
Ketones, ur: 5 mg/dL — AB
Leukocytes,Ua: NEGATIVE
Nitrite: NEGATIVE
Protein, ur: NEGATIVE mg/dL
Specific Gravity, Urine: 1.022 (ref 1.005–1.030)
pH: 5 (ref 5.0–8.0)

## 2022-06-06 LAB — BASIC METABOLIC PANEL
Anion gap: 16 — ABNORMAL HIGH (ref 5–15)
Anion gap: 7 (ref 5–15)
Anion gap: 8 (ref 5–15)
BUN: 54 mg/dL — ABNORMAL HIGH (ref 8–23)
BUN: 53 mg/dL — ABNORMAL HIGH (ref 8–23)
BUN: 51 mg/dL — ABNORMAL HIGH (ref 8–23)
CO2: 18 mmol/L — ABNORMAL LOW (ref 22–32)
CO2: 23 mmol/L (ref 22–32)
CO2: 26 mmol/L (ref 22–32)
Calcium: 8.6 mg/dL — ABNORMAL LOW (ref 8.9–10.3)
Calcium: 7.8 mg/dL — ABNORMAL LOW (ref 8.9–10.3)
Calcium: 8.5 mg/dL — ABNORMAL LOW (ref 8.9–10.3)
Chloride: 103 mmol/L (ref 98–111)
Chloride: 104 mmol/L (ref 98–111)
Chloride: 108 mmol/L (ref 98–111)
Creatinine, Ser: 1.8 mg/dL — ABNORMAL HIGH (ref 0.61–1.24)
Creatinine, Ser: 1.65 mg/dL — ABNORMAL HIGH (ref 0.61–1.24)
Creatinine, Ser: 1.67 mg/dL — ABNORMAL HIGH (ref 0.61–1.24)
GFR, Estimated: 40 mL/min — ABNORMAL LOW (ref >60)
GFR, Estimated: 45 mL/min — ABNORMAL LOW (ref >60)
GFR, Estimated: 44 mL/min — ABNORMAL LOW (ref >60)
Glucose, Bld: 344 mg/dL — ABNORMAL HIGH (ref 70–99)
Glucose, Bld: 421 mg/dL — ABNORMAL HIGH (ref 70–99)
Glucose, Bld: 109 mg/dL — ABNORMAL HIGH (ref 70–99)
Potassium: 3.1 mmol/L — ABNORMAL LOW (ref 3.5–5.1)
Potassium: 4.2 mmol/L (ref 3.5–5.1)
Potassium: 3.6 mmol/L (ref 3.5–5.1)
Sodium: 137 mmol/L (ref 135–145)
Sodium: 134 mmol/L — ABNORMAL LOW (ref 135–145)
Sodium: 142 mmol/L (ref 135–145)

## 2022-06-06 LAB — GLUCOSE, CAPILLARY
Glucose-Capillary: 407 mg/dL — ABNORMAL HIGH (ref 70–99)
Glucose-Capillary: 411 mg/dL — ABNORMAL HIGH (ref 70–99)
Glucose-Capillary: 326 mg/dL — ABNORMAL HIGH (ref 70–99)
Glucose-Capillary: 278 mg/dL — ABNORMAL HIGH (ref 70–99)
Glucose-Capillary: 333 mg/dL — ABNORMAL HIGH (ref 70–99)
Glucose-Capillary: 163 mg/dL — ABNORMAL HIGH (ref 70–99)
Glucose-Capillary: 83 mg/dL (ref 70–99)
Glucose-Capillary: 92 mg/dL (ref 70–99)
Glucose-Capillary: 71 mg/dL (ref 70–99)

## 2022-06-06 LAB — CBC WITH DIFFERENTIAL/PLATELET
Abs Immature Granulocytes: 0.02 10*3/uL (ref 0.00–0.07)
Basophils Absolute: 0 10*3/uL (ref 0.0–0.1)
Basophils Relative: 0 %
Eosinophils Absolute: 0 10*3/uL (ref 0.0–0.5)
Eosinophils Relative: 0 %
HCT: 20.5 % — ABNORMAL LOW (ref 39.0–52.0)
Hemoglobin: 6.3 g/dL — CL (ref 13.0–17.0)
Immature Granulocytes: 1 %
Lymphocytes Relative: 22 %
Lymphs Abs: 1 10*3/uL (ref 0.7–4.0)
MCH: 30.6 pg (ref 26.0–34.0)
MCHC: 30.7 g/dL (ref 30.0–36.0)
MCV: 99.5 fL (ref 80.0–100.0)
Monocytes Absolute: 0.5 10*3/uL (ref 0.1–1.0)
Monocytes Relative: 13 %
Neutro Abs: 2.7 10*3/uL (ref 1.7–7.7)
Neutrophils Relative %: 64 %
Platelets: 228 10*3/uL (ref 150–400)
RBC: 2.06 MIL/uL — ABNORMAL LOW (ref 4.22–5.81)
RDW: 17.1 % — ABNORMAL HIGH (ref 11.5–15.5)
WBC: 4.3 10*3/uL (ref 4.0–10.5)
nRBC: 0 % (ref 0.0–0.2)

## 2022-06-06 LAB — CBG MONITORING, ED
Glucose-Capillary: 314 mg/dL — ABNORMAL HIGH (ref 70–99)
Glucose-Capillary: 209 mg/dL — ABNORMAL HIGH (ref 70–99)
Glucose-Capillary: 230 mg/dL — ABNORMAL HIGH (ref 70–99)

## 2022-06-06 LAB — BLOOD GAS, VENOUS
Acid-base deficit: 4.5 mmol/L — ABNORMAL HIGH (ref 0.0–2.0)
Bicarbonate: 21.7 mmol/L (ref 20.0–28.0)
O2 Saturation: 52 %
Patient temperature: 37
pCO2, Ven: 43 mmHg — ABNORMAL LOW (ref 44–60)
pH, Ven: 7.31 (ref 7.25–7.43)
pO2, Ven: <31 mmHg — CL (ref 32–45)

## 2022-06-06 LAB — CBC
HCT: 18.6 % — ABNORMAL LOW (ref 39.0–52.0)
Hemoglobin: 6 g/dL — CL (ref 13.0–17.0)
MCH: 30.6 pg (ref 26.0–34.0)
MCHC: 32.3 g/dL (ref 30.0–36.0)
MCV: 94.9 fL (ref 80.0–100.0)
Platelets: 173 10*3/uL (ref 150–400)
RBC: 1.96 MIL/uL — ABNORMAL LOW (ref 4.22–5.81)
RDW: 18.2 % — ABNORMAL HIGH (ref 11.5–15.5)
WBC: 4.2 10*3/uL (ref 4.0–10.5)
nRBC: 0.7 % — ABNORMAL HIGH (ref 0.0–0.2)

## 2022-06-06 LAB — OSMOLALITY: Osmolality: 332 mOsm/kg (ref 275–295)

## 2022-06-06 LAB — OCCULT BLOOD X 1 CARD TO LAB, STOOL: Fecal Occult Bld: POSITIVE — AB

## 2022-06-06 LAB — BETA-HYDROXYBUTYRIC ACID
Beta-Hydroxybutyric Acid: 3.06 mmol/L — ABNORMAL HIGH (ref 0.05–0.27)
Beta-Hydroxybutyric Acid: 0.3 mmol/L — ABNORMAL HIGH (ref 0.05–0.27)
Beta-Hydroxybutyric Acid: 0.09 mmol/L (ref 0.05–0.27)

## 2022-06-06 LAB — MRSA NEXT GEN BY PCR, NASAL: MRSA by PCR Next Gen: NOT DETECTED

## 2022-06-06 LAB — PREPARE RBC (CROSSMATCH)

## 2022-06-06 MED ORDER — INSULIN ASPART 100 UNIT/ML IJ SOLN
0.0000 [IU] | Freq: Three times a day (TID) | INTRAMUSCULAR | Status: DC
  Administered 2022-06-07 – 2022-06-08 (×2): 3 [IU] via SUBCUTANEOUS
  Administered 2022-06-08: 7 [IU] via SUBCUTANEOUS
  Administered 2022-06-09: 1 [IU] via SUBCUTANEOUS
  Administered 2022-06-10: 2 [IU] via SUBCUTANEOUS
  Administered 2022-06-10: 5 [IU] via SUBCUTANEOUS
  Administered 2022-06-11: 7 [IU] via SUBCUTANEOUS
  Administered 2022-06-11: 1 [IU] via SUBCUTANEOUS
  Administered 2022-06-12: 5 [IU] via SUBCUTANEOUS

## 2022-06-06 MED ORDER — SODIUM CHLORIDE 0.9% FLUSH
10.0000 mL | Freq: Two times a day (BID) | INTRAVENOUS | Status: DC
  Administered 2022-06-06: 10 mL
  Administered 2022-06-07: 30 mL
  Administered 2022-06-07 – 2022-06-09 (×2): 10 mL
  Administered 2022-06-10: 30 mL
  Administered 2022-06-10 – 2022-06-12 (×3): 10 mL

## 2022-06-06 MED ORDER — PANTOPRAZOLE SODIUM 40 MG IV SOLR: 40.0000 mg | Freq: Two times a day (BID) | INTRAVENOUS | Status: DC

## 2022-06-06 MED ORDER — SODIUM CHLORIDE 0.9 % IV SOLN: INTRAVENOUS | Status: DC | PRN

## 2022-06-06 MED ORDER — DEXTROSE 50 % IV SOLN: 0.0000 mL | INTRAVENOUS | Status: DC | PRN

## 2022-06-06 MED ORDER — INSULIN REGULAR(HUMAN) IN NACL 100-0.9 UT/100ML-% IV SOLN
INTRAVENOUS | Status: DC
  Administered 2022-06-06: 6.5 [IU]/h via INTRAVENOUS
  Filled 2022-06-06: qty 100

## 2022-06-06 MED ORDER — SODIUM CHLORIDE 0.9% FLUSH: 10.0000 mL | INTRAVENOUS | Status: DC | PRN

## 2022-06-06 MED ORDER — PANTOPRAZOLE INFUSION (NEW) - SIMPLE MED
8.0000 mg/h | INTRAVENOUS | Status: DC
  Administered 2022-06-06 – 2022-06-07 (×3): 8 mg/h via INTRAVENOUS
  Filled 2022-06-06: qty 100
  Filled 2022-06-06 (×2): qty 80
  Filled 2022-06-06 (×2): qty 100

## 2022-06-06 MED ORDER — POTASSIUM CHLORIDE CRYS ER 20 MEQ PO TBCR
40.0000 meq | EXTENDED_RELEASE_TABLET | Freq: Once | ORAL | Status: AC
  Administered 2022-06-06: 40 meq via ORAL
  Filled 2022-06-06: qty 2

## 2022-06-06 MED ORDER — MIDODRINE HCL 5 MG PO TABS: 15.0000 mg | ORAL_TABLET | Freq: Three times a day (TID) | ORAL | Status: DC

## 2022-06-06 MED ORDER — PANTOPRAZOLE 80MG IVPB - SIMPLE MED
80.0000 mg | Freq: Once | INTRAVENOUS | Status: AC
  Administered 2022-06-06: 80 mg via INTRAVENOUS
  Filled 2022-06-06: qty 80

## 2022-06-06 MED ORDER — ORAL CARE MOUTH RINSE: 15.0000 mL | OROMUCOSAL | Status: DC | PRN

## 2022-06-06 MED ORDER — INSULIN ASPART 100 UNIT/ML IJ SOLN: 0.0000 [IU] | INTRAMUSCULAR | Status: DC

## 2022-06-06 MED ORDER — INSULIN ASPART 100 UNIT/ML IJ SOLN
0.0000 [IU] | Freq: Every day | INTRAMUSCULAR | Status: DC
  Administered 2022-06-08 – 2022-06-09 (×2): 3 [IU] via SUBCUTANEOUS
  Administered 2022-06-11: 2 [IU] via SUBCUTANEOUS

## 2022-06-06 MED ORDER — FLUDROCORTISONE ACETATE 0.1 MG PO TABS
0.2000 mg | ORAL_TABLET | Freq: Every day | ORAL | Status: DC
  Filled 2022-06-06: qty 2

## 2022-06-06 MED ORDER — SODIUM CHLORIDE 0.9% IV SOLUTION: Freq: Once | INTRAVENOUS | Status: AC

## 2022-06-06 MED ORDER — LACTATED RINGERS IV SOLN: INTRAVENOUS | Status: DC

## 2022-06-06 MED ORDER — DEXTROSE-NACL 5-0.9 % IV SOLN: INTRAVENOUS | Status: DC

## 2022-06-06 MED ORDER — DEXTROSE IN LACTATED RINGERS 5 % IV SOLN: INTRAVENOUS | Status: DC

## 2022-06-06 MED ORDER — INSULIN GLARGINE-YFGN 100 UNIT/ML ~~LOC~~ SOLN
5.0000 [IU] | SUBCUTANEOUS | Status: DC
  Administered 2022-06-06: 5 [IU] via SUBCUTANEOUS
  Filled 2022-06-06 (×2): qty 0.05

## 2022-06-06 MED ORDER — LACTATED RINGERS IV BOLUS
20.0000 mL/kg | Freq: Once | INTRAVENOUS | Status: AC
  Administered 2022-06-06: 1306 mL via INTRAVENOUS

## 2022-06-06 MED ORDER — SODIUM CHLORIDE 0.9% IV SOLUTION: Freq: Once | INTRAVENOUS | Status: DC

## 2022-06-06 MED ORDER — CHLORHEXIDINE GLUCONATE CLOTH 2 % EX PADS
6.0000 | MEDICATED_PAD | Freq: Every day | CUTANEOUS | Status: DC
  Administered 2022-06-06 – 2022-06-12 (×6): 6 via TOPICAL

## 2022-06-06 NOTE — H&P (Signed)
History and Physical    Nicholas Weiss [id number] DOB: 1950-12-03 DOA: 06/06/2022  I have briefly reviewed the patient's prior medical records in [place]  PCP: Dion Body, MD  Patient coming from:SNF- [facility]  Chief Complaint: sent from rehab with hyperglycemia  HPI: Nicholas Weiss is a 72 y.o. male with medical history significant of type 1 diabetes, hypertension, recent amputation of his left toes.  Patient recently at [facility] with falls due to orthostatic hypotension, hypertensive urgency, osteomyelitis with partial second toe amputation, and subdural hematoma.  He was discharged from [facility] to [facility].  This a.m. at [facility] he was noted to have a glucose reading of greater than 500.  He was sent in for further evaluation.  In the ER, he was found to have a low hemoglobin and was found to have melanotic stools.  ER PA spoke with GI who plans to scope patient in the morning.  In the ER patient also was found to have hyperglycemia with anion gap of 16 and a beta hydroxybutyrate acid of greater than 3.  Patient does state he has occasional nausea but denies chest pain, shortness of breath, headache.    Review of Systems: As per HPI otherwise 10 point review of systems negative.   Past Medical History:  Diagnosis Date   Acute metabolic encephalopathy [id number]   DKA (diabetic ketoacidoses) 04/06/2016   Hypercholesteremia    Hypertension     Past Surgical History:  Procedure Laterality Date   AMPUTATION TOE Left 04/25/2022   Procedure: LEFT GREAT TOE AMPUTATION AND LEFT PARTIAL 2ND TOE AMPUTATION;  Surgeon: Edrick Kins, DPM;  Location: [facility] ORS;  Service: Podiatry;  Laterality: Left;   COLONOSCOPY WITH PROPOFOL N/A 02/01/2020   Procedure: COLONOSCOPY WITH PROPOFOL;  Surgeon: Lin Landsman, MD;  Location: [facility] ENDOSCOPY;  Service: Gastroenterology;  Laterality: N/A;   ESOPHAGOGASTRODUODENOSCOPY  02/01/2020   Procedure:  ESOPHAGOGASTRODUODENOSCOPY (EGD);  Surgeon: Lin Landsman, MD;  Location: [facility] ENDOSCOPY;  Service: Gastroenterology;;   KNEE SURGERY Right    Torn meniscus   KNEE SURGERY Left      reports that he has never smoked. He has never used smokeless tobacco. He reports that he does not currently use alcohol. He reports current drug use. Drugs: Marijuana and "Crack" cocaine.  Allergies  Allergen Reactions   Penicillins Other (See Comments)    Childhood allergy -tolerated amoxil 03/2022 Unknown reaction Has patient had a PCN reaction causing immediate rash, facial/tongue/throat swelling, SOB or lightheadedness with hypotension: No Has patient had a PCN reaction causing severe rash involving mucus membranes or skin necrosis: No Has patient had a PCN reaction that required hospitalization No Has patient had a PCN reaction occurring within the last 10 years: No If all of the above answers are "NO", then may proceed with Cephalosporin use.     Family History  Problem Relation Age of Onset   Heart attack Father    Hypertension Sister    Cancer Sister     Prior to Admission medications   Medication Sig Start Date End Date Taking? Authorizing Provider  acetaminophen (TYLENOL) 500 MG tablet Take 1,000 mg by mouth daily as needed for mild pain or moderate pain.    [provider]  acyclovir (ZOVIRAX) 200 MG capsule Take 2 capsules (400 mg total) by mouth 2 (two) times daily. 04/29/22   Ezekiel Slocumb, DO  ascorbic acid (VITAMIN C) 500 MG tablet Take 1 tablet (500 mg total) by mouth  daily. 04/09/22   Lorella Nimrod, MD  aspirin EC 81 MG tablet Take 81 mg by mouth in the morning.    [provider]  atorvastatin (LIPITOR) 80 MG tablet Take 1 tablet (80 mg total) by mouth at bedtime. Hold while taking Paxlovid 04/08/22   Lorella Nimrod, MD  Baclofen 5 MG TABS Take 5 mg by mouth daily as needed (muscle spasm).    [provider]  clopidogrel (PLAVIX) 75 MG tablet Take 75  mg by mouth in the morning. 12/03/21   [provider]  docusate sodium (COLACE) 100 MG capsule Take 100 mg by mouth daily as needed for mild constipation.    [provider]  FARXIGA 10 MG TABS tablet Take 10 mg by mouth in the morning. 05/05/21   [provider]  feeding supplement, GLUCERNA SHAKE, (GLUCERNA SHAKE) LIQD Take 237 mLs by mouth 3 (three) times daily between meals. 04/29/22   Nicole Kindred A, DO  ferrous sulfate 325 (65 FE) MG tablet Take 1 tablet (325 mg total) by mouth daily with breakfast. 04/09/22   Lorella Nimrod, MD  fludrocortisone (FLORINEF) 0.1 MG tablet Take 2 tablets (0.2 mg total) by mouth daily. 05/29/22   Sreenath, Trula Slade, MD  guaiFENesin-dextromethorphan (ROBITUSSIN DM) 100-10 MG/5ML syrup Take 10 mLs by mouth every 4 (four) hours as needed for cough. 04/08/22   Lorella Nimrod, MD  HYDROcodone-acetaminophen (NORCO/VICODIN) 5-325 MG tablet Take 1 tablet by mouth every 6 (six) hours as needed for severe pain. 05/28/22   Sreenath, Sudheer B, MD  insulin degludec (TRESIBA FLEXTOUCH) 100 UNIT/ML FlexTouch Pen Inject 15 Units into the skin daily. Patient taking differently: Inject 20 Units into the skin daily. 03/12/22   Wyvonnia Dusky, MD  insulin lispro (HUMALOG) 100 UNIT/ML injection Inject into the skin 3 (three) times daily before meals.    [provider]  midodrine (PROAMATINE) 5 MG tablet Take 3 tablets (15 mg total) by mouth 3 (three) times daily with meals. 05/28/22   Sidney Ace, MD  Multiple Vitamin (MULTIVITAMIN WITH MINERALS) TABS tablet Take 1 tablet by mouth daily. 04/09/22   Lorella Nimrod, MD  NOVOLOG 100 UNIT/ML injection Inject 0-9 Units into the skin 3 (three) times daily with meals. 03/12/22   Wyvonnia Dusky, MD  ondansetron (ZOFRAN) 8 MG tablet Take 8 mg by mouth daily as needed for nausea or vomiting.    [provider]  tamsulosin (FLOMAX) 0.4 MG CAPS capsule Take 0.4 mg by mouth daily.    [provider]  zinc sulfate 220 (50 Zn) MG capsule Take 1 capsule (220 mg total) by mouth daily. 04/09/22   Lorella Nimrod, MD    Physical Exam: Vitals:   06/06/22 0938 06/06/22 0939  BP: (!) 100/58   Pulse: 95   Resp: 18   Temp: 98.1 F (36.7 C)   SpO2: 100%   Weight:  65.3 kg  Height:  '5\' 10"'$  (1.778 m)      Constitutional: NAD, calm, comfortable Eyes: PERRL, lids and conjunctivae normal ENMT: Mucous membranes are moist. Posterior pharynx clear of any exudate or lesions.Normal dentition.  Neck: normal, supple, no masses, no thyromegaly Respiratory: clear to auscultation bilaterally, no wheezing, no crackles. Normal respiratory effort. No accessory muscle use.  Cardiovascular: Regular rate and rhythm, no murmurs / rubs / gallops. No extremity edema. 2+ pedal pulses.  Abdomen: no tenderness, no masses palpated. Bowel sounds positive.  Musculoskeletal: no clubbing / cyanosis. Normal muscle tone.  Skin: no  rashes, lesions, ulcers. No induration Neurologic: CN 2-12 grossly intact. Strength 5/5 in all 4.  Psychiatric: Normal judgment and insight. Alert and oriented x 3. Normal mood.   Labs on Admission: I have personally reviewed following labs and imaging studies  CBC: Recent Labs  Lab 06/01/22 0850 06/06/22 1004  WBC 3.3* 4.3  NEUTROABS 2.2 2.7  HGB 8.3* 6.3*  HCT 26.3* 20.5*  MCV 94.9 99.5  PLT 262 [id number]   Basic Metabolic Panel: Recent Labs  Lab 06/06/22 1004  NA 137  K 3.1*  CL 103  CO2 18*  GLUCOSE 344*  BUN 54*  CREATININE 1.80*  CALCIUM 8.6*   GFR: Estimated Creatinine Clearance: 35.8 mL/min (A) (by C-G formula based on SCr of 1.8 mg/dL (H)). Liver Function Tests: No results for input(s): "AST", "ALT", "ALKPHOS", "BILITOT", "PROT", "ALBUMIN" in the last 168 hours. No results for input(s): "LIPASE", "AMYLASE" in the last 168 hours. No results for input(s): "AMMONIA" in the last 168 hours. Coagulation Profile: No results for input(s): "INR", "PROTIME" in  the last 168 hours. Cardiac Enzymes: No results for input(s): "CKTOTAL", "CKMB", "CKMBINDEX", "TROPONINI" in the last 168 hours. BNP (last 3 results) No results for input(s): "PROBNP" in the last 8760 hours. HbA1C: No results for input(s): "HGBA1C" in the last 72 hours. CBG: Recent Labs  Lab 06/06/22 1001 06/06/22 1134  GLUCAP 314* 209*   Lipid Profile: No results for input(s): "CHOL", "HDL", "LDLCALC", "TRIG", "CHOLHDL", "LDLDIRECT" in the last 72 hours. Thyroid Function Tests: No results for input(s): "TSH", "T4TOTAL", "FREET4", "T3FREE", "THYROIDAB" in the last 72 hours. Anemia Panel: No results for input(s): "VITAMINB12", "FOLATE", "FERRITIN", "TIBC", "IRON", "RETICCTPCT" in the last 72 hours. Urine analysis:    Component Value Date/Time   COLORURINE YELLOW (A) 04/21/2022 1636   APPEARANCEUR HAZY (A) 04/21/2022 1636   LABSPEC 1.014 04/21/2022 1636   PHURINE 5.0 04/21/2022 1636   GLUCOSEU >=500 (A) 04/21/2022 1636   HGBUR NEGATIVE 04/21/2022 1636   BILIRUBINUR NEGATIVE 04/21/2022 1636   KETONESUR 5 (A) 04/21/2022 1636   PROTEINUR NEGATIVE 04/21/2022 1636   NITRITE NEGATIVE 04/21/2022 1636   LEUKOCYTESUR NEGATIVE 04/21/2022 1636     Radiological Exams on Admission: No results found.  EKG: Independently reviewed. sinus  Assessment/Plan Principal Problem:   GI bleed Active Problems:   DKA (diabetic ketoacidosis) ([place])   DKA in a type 1 DM -mild -will do insulin gtt for brief time prior to starting on insulin -BMP/beta hydroxybutyric -unclear trigger, does not have fevers/WBC count -getting 1 unit of blood-- Hgb 8.3--6.3 over last 4 days  Hypokalemia -replete  GI bleed -NPO after midnight -GI consulted-- plan for scope in AM -hold ASA/plavix (last dose: 3/8)  H/o substance abuse - in a facility so doubt actively using  AKI -IVF -I/O -daily labs  H/o orthostatic hypotension -OOB -on fluticasone   Resume home meds once verified    DVT  prophylaxis: scd  Code Status: full  Family Communication: none at bedside Disposition Plan: back to SNF once medically stable-- TOC consulted Consults called: GITherisa Doyne    Admission status: inpt   Geradine Girt [facility]   How to contact the [facility] Attending or Consulting provider [place] or covering provider during after hours 7P -7A, for this patient?  Check the care team in [facility] and look for a) attending/consulting [facility] provider listed and b) the [facility] team listed Log into [email or web address] and use [place] universal password to access. If you do not have the  password, please contact the hospital operator. Locate the [facility] provider you are looking for under [facility] and page to a number that you can be directly reached. If you still have difficulty reaching the provider, please page the [facility] (Director on Call) for the Hospitalists listed on amion for assistance.  06/06/2022, 12:15 PM

## 2022-06-06 NOTE — ED Provider Notes (Signed)
[place] Provider Note   CSN: [id number] Arrival date & time: 06/06/22  [id number]     History  Chief Complaint  Patient presents with   Hyperglycemia    Nicholas Weiss is a 72 y.o. male.  Patient presents the emergency department via EMS with concerns about hyperglycemia.  Patient is currently at rehab due to left great toe and partial left second toe amputation which occurred in January of this year.  Patient has reportedly been given his diabetic medications as normal but was noted to have a capillary blood glucose reading of greater than 500 this morning.  The patient does endorse drinking orange juice at breakfast this morning which she normally does not do.  Patient is a type I diabetic currently on NovoLog, Tresiba.  He does endorse some mild nausea last night but states that has resolved as of this morning.  He also endorses constipation earlier in the week but had a bowel movement yesterday.  Patient is currently asymptomatic.  He denies chest pain, abdominal pain, nausea, vomiting, urinary symptoms.  Past medical history otherwise significant for hypertension, smoldering multiple myeloma, stage IIIa CKD, diabetic neuropathy, stroke  HPI     Home Medications Prior to Admission medications   Medication Sig Start Date End Date Taking? Authorizing Provider  acetaminophen (TYLENOL) 500 MG tablet Take 1,000 mg by mouth daily as needed for mild pain or moderate pain.    [provider]  acyclovir (ZOVIRAX) 200 MG capsule Take 2 capsules (400 mg total) by mouth 2 (two) times daily. 04/29/22   Ezekiel Slocumb, DO  ascorbic acid (VITAMIN C) 500 MG tablet Take 1 tablet (500 mg total) by mouth daily. 04/09/22   Lorella Nimrod, MD  aspirin EC 81 MG tablet Take 81 mg by mouth in the morning.    [provider]  atorvastatin (LIPITOR) 80 MG tablet Take 1 tablet (80 mg total) by mouth at bedtime. Hold while taking Paxlovid 04/08/22    Lorella Nimrod, MD  Baclofen 5 MG TABS Take 5 mg by mouth daily as needed (muscle spasm).    [provider]  clopidogrel (PLAVIX) 75 MG tablet Take 75 mg by mouth in the morning. 12/03/21   [provider]  docusate sodium (COLACE) 100 MG capsule Take 100 mg by mouth daily as needed for mild constipation.    [provider]  FARXIGA 10 MG TABS tablet Take 10 mg by mouth in the morning. 05/05/21   [provider]  feeding supplement, GLUCERNA SHAKE, (GLUCERNA SHAKE) LIQD Take 237 mLs by mouth 3 (three) times daily between meals. 04/29/22   Nicole Kindred A, DO  ferrous sulfate 325 (65 FE) MG tablet Take 1 tablet (325 mg total) by mouth daily with breakfast. 04/09/22   Lorella Nimrod, MD  fludrocortisone (FLORINEF) 0.1 MG tablet Take 2 tablets (0.2 mg total) by mouth daily. 05/29/22   Sreenath, Trula Slade, MD  guaiFENesin-dextromethorphan (ROBITUSSIN DM) 100-10 MG/5ML syrup Take 10 mLs by mouth every 4 (four) hours as needed for cough. 04/08/22   Lorella Nimrod, MD  HYDROcodone-acetaminophen (NORCO/VICODIN) 5-325 MG tablet Take 1 tablet by mouth every 6 (six) hours as needed for severe pain. 05/28/22   Sreenath, Sudheer B, MD  insulin degludec (TRESIBA FLEXTOUCH) 100 UNIT/ML FlexTouch Pen Inject 15 Units into the skin daily. Patient taking differently: Inject 20 Units into the skin daily. 03/12/22   Wyvonnia Dusky, MD  insulin lispro (HUMALOG) 100 UNIT/ML injection  Inject into the skin 3 (three) times daily before meals.    [provider]  midodrine (PROAMATINE) 5 MG tablet Take 3 tablets (15 mg total) by mouth 3 (three) times daily with meals. 05/28/22   Sidney Ace, MD  Multiple Vitamin (MULTIVITAMIN WITH MINERALS) TABS tablet Take 1 tablet by mouth daily. 04/09/22   Lorella Nimrod, MD  NOVOLOG 100 UNIT/ML injection Inject 0-9 Units into the skin 3 (three) times daily with meals. 03/12/22   Wyvonnia Dusky, MD  ondansetron (ZOFRAN) 8 MG tablet Take 8 mg  by mouth daily as needed for nausea or vomiting.    [provider]  tamsulosin (FLOMAX) 0.4 MG CAPS capsule Take 0.4 mg by mouth daily.    [provider]  zinc sulfate 220 (50 Zn) MG capsule Take 1 capsule (220 mg total) by mouth daily. 04/09/22   Lorella Nimrod, MD      Allergies    Penicillins    Review of Systems   Review of Systems  Respiratory:  Negative for shortness of breath.   Cardiovascular:  Negative for chest pain.  Gastrointestinal:  Negative for abdominal pain, nausea and vomiting.  Genitourinary:  Negative for dysuria.    Physical Exam Updated Vital Signs BP (!) 100/58   Pulse 95   Temp 98.1 F (36.7 C)   Resp 18   Ht '5\' 10"'$  (1.778 m)   Wt 65.3 kg   SpO2 100%   BMI 20.66 kg/m  Physical Exam Vitals and nursing note reviewed. Exam conducted with a chaperone present.  Constitutional:      General: He is not in acute distress.    Appearance: He is well-developed.  HENT:     Head: Normocephalic and atraumatic.  Eyes:     Conjunctiva/sclera: Conjunctivae normal.  Cardiovascular:     Rate and Rhythm: Normal rate and regular rhythm.     Heart sounds: No murmur heard. Pulmonary:     Effort: Pulmonary effort is normal. No respiratory distress.     Breath sounds: Normal breath sounds.  Abdominal:     Palpations: Abdomen is soft.     Tenderness: There is no abdominal tenderness.  Genitourinary:    Rectum: Guaiac result positive.  Musculoskeletal:        General: No swelling.     Cervical back: Neck supple.     Comments: Left toe amputation appears to be well healing with no signs of infection  Skin:    General: Skin is warm and dry.     Capillary Refill: Capillary refill takes less than 2 seconds.  Neurological:     Mental Status: He is alert.  Psychiatric:        Mood and Affect: Mood normal.     ED Results / Procedures / Treatments   Labs (all labs ordered are listed, but only abnormal results are displayed) Labs Reviewed  BASIC  METABOLIC PANEL - Abnormal; Notable for the following components:      Result Value   Potassium 3.1 (*)    CO2 18 (*)    Glucose, Bld 344 (*)    BUN 54 (*)    Creatinine, Ser 1.80 (*)    Calcium 8.6 (*)    GFR, Estimated 40 (*)    Anion gap 16 (*)    All other components within normal limits  CBC WITH DIFFERENTIAL/PLATELET - Abnormal; Notable for the following components:   RBC 2.06 (*)    Hemoglobin 6.3 (*)    HCT  20.5 (*)    RDW 17.1 (*)    All other components within normal limits  BLOOD GAS, VENOUS - Abnormal; Notable for the following components:   pCO2, Ven 43 (*)    pO2, Ven <31 (*)    Acid-base deficit 4.5 (*)    All other components within normal limits  CBG MONITORING, ED - Abnormal; Notable for the following components:   Glucose-Capillary 314 (*)    All other components within normal limits  CBG MONITORING, ED - Abnormal; Notable for the following components:   Glucose-Capillary 209 (*)    All other components within normal limits  URINALYSIS, ROUTINE W REFLEX MICROSCOPIC  BETA-HYDROXYBUTYRIC ACID  OSMOLALITY  OCCULT BLOOD X 1 CARD TO LAB, STOOL  TYPE AND SCREEN  PREPARE RBC (CROSSMATCH)    EKG EKG Interpretation  Date/Time:  Saturday June 06 2022 11:01:56 EST Ventricular Rate:  81 PR Interval:  284 QRS Duration: 89 QT Interval:  429 QTC Calculation: 498 R Axis:   85 Text Interpretation: Sinus or ectopic atrial rhythm Prolonged PR interval Borderline right axis deviation Nonspecific T abnrm, anterolateral leads Confirmed by Octaviano Glow [phone]) on 06/06/2022 11:16:07 AM  Radiology No results found.  Procedures .Critical Care  Performed by: Dorothyann Peng, PA-C Authorized by: Dorothyann Peng, PA-C   Critical care provider statement:    Critical care time (minutes):  30   Critical care was necessary to treat or prevent imminent or life-threatening deterioration of the following conditions:  Circulatory failure   Critical care was time spent  personally by me on the following activities:  Development of treatment plan with patient or surrogate, discussions with consultants, evaluation of patient's response to treatment, examination of patient, ordering and review of laboratory studies, ordering and review of radiographic studies, ordering and performing treatments and interventions, pulse oximetry, re-evaluation of patient's condition and review of old charts     Medications Ordered in ED Medications  0.9 %  sodium chloride infusion (Manually program via Guardrails IV Fluids) (has no administration in time range)  pantoprazole (PROTONIX) 80 mg /NS 100 mL IVPB (has no administration in time range)  pantoprozole (PROTONIX) 80 mg /NS 100 mL infusion (has no administration in time range)  pantoprazole (PROTONIX) injection 40 mg (has no administration in time range)  lactated ringers bolus 1,306 mL (1,306 mLs Intravenous New Bag/Given 06/06/22 1004)    ED Course/ Medical Decision Making/ A&P                             Medical Decision Making Amount and/or Complexity of Data Reviewed Labs: ordered.  Risk Prescription drug management.   This patient presents to the ED for concern of hyperglycemia, this involves an extensive number of treatment options, and is a complaint that carries with it a high risk of complications and morbidity.  The differential diagnosis includes hyperglycemia, dehydration, infection, DKA, HHS, others   Co morbidities that complicate the patient evaluation  Type I DM with neuropathy, stage III CKD, recent amputation   Additional history obtained:  Additional history obtained from EMS External records from outside source obtained and reviewed including H&P and discharge summary from February 15 with discharge summary on February 29.  Patient was found after multiple falls to have a subdural hematoma and patient was placed on Keppra.  Patient was also confused on admission.   Lab Tests:  I Ordered,  and personally interpreted labs.  The pertinent results  include: Hemoglobin 6.3 (8.3 5 days ago), creatinine 1.80 (baseline 1.3-1.4), BUN 54, K 3.1,    Cardiac Monitoring: / EKG:  The patient was maintained on a cardiac monitor.  I personally viewed and interpreted the cardiac monitored which showed an underlying rhythm of: sinus rhythm   Consultations Obtained:  I requested consultation with the gastroenterologist, Dr. Therisa Doyne,  and discussed lab and imaging findings as well as pertinent plan - they recommend: Clear liquids today, n.p.o. at midnight, PPI  Requested consultation with the hospitalist, Dr. Tana Coast, who agreed to see the patient for admission   Problem List / ED Course / Critical interventions / Medication management   I ordered medication including lactated Ringer's bolus for hyperglycemia, 1 unit PRBC for ABLA, PPI for possible upper GI bleed Reevaluation of the patient after these medicines showed that the patient improved I have reviewed the patients home medicines and have made adjustments as needed   Test / Admission - Considered:  Patient has what appears to be acute blood loss anemia due to a likely GI bleed.  Patient is currently being administered 1 unit PRBC.  Patient will need admission for further evaluation and management including evaluation by gastroenterology.        Final Clinical Impression(s) / ED Diagnoses Final diagnoses:  ABLA (acute blood loss anemia)  Gastrointestinal hemorrhage, unspecified gastrointestinal hemorrhage type    Rx / DC Orders ED Discharge Orders     None         Ronny Bacon 06/06/22 1148    Wyvonnia Dusky, MD 06/06/22 1315

## 2022-06-06 NOTE — ED Notes (Signed)
ED TO INPATIENT HANDOFF REPORT  ED Nurse Name and Phone #: [phone]  S Name/Age/Gender Nicholas Weiss 72 y.o. male Room/Bed: WA12/WA12  Code Status   Code Status: Full Code  Home/SNF/Other Rehab Patient oriented to: self, place, time, and situation Is this baseline? Yes   Triage Complete: Triage complete  Chief Complaint GI bleed [K92.2] DKA (diabetic ketoacidosis) ([place]) [E11.10]  Triage Note Pt to ER via EMS from [facility] with reports of BS 596.  Pt rechecked by EMS 577.  Pt is type 1 diabetic, was given regular medications with no change.  Pt states has been constipated, had BM yesterday.  Denies pain or other concerns at this time.   Allergies Allergies  Allergen Reactions   Penicillins Other (See Comments)    Childhood allergy -tolerated amoxil 03/2022 Unknown reaction Has patient had a PCN reaction causing immediate rash, facial/tongue/throat swelling, SOB or lightheadedness with hypotension: No Has patient had a PCN reaction causing severe rash involving mucus membranes or skin necrosis: No Has patient had a PCN reaction that required hospitalization No Has patient had a PCN reaction occurring within the last 10 years: No If all of the above answers are "NO", then may proceed with Cephalosporin use.     Level of Care/Admitting Diagnosis ED Disposition     ED Disposition  Admit   Condition  --   Comment  Hospital Area: [place] [[id number]]  Level of Care: Progressive [102]  Admit to Progressive based on following criteria: Other see comments  Comments: insulin gtt  May admit patient to [name] or [facility] if equivalent level of care is available:: Yes  Covid Evaluation: Asymptomatic - no recent exposure (last 10 days) testing not required  Diagnosis: DKA (diabetic ketoacidosis) [facility]) [id number]  Admitting Physician: Maida Sale  Attending Physician: Geradine Girt [id number]  Certification:: I certify this patient  will need inpatient services for at least 2 midnights          B Medical/Surgery History Past Medical History:  Diagnosis Date   Acute metabolic encephalopathy [id number]   DKA (diabetic ketoacidoses) 04/06/2016   Hypercholesteremia    Hypertension    Past Surgical History:  Procedure Laterality Date   AMPUTATION TOE Left 04/25/2022   Procedure: LEFT GREAT TOE AMPUTATION AND LEFT PARTIAL 2ND TOE AMPUTATION;  Surgeon: Edrick Kins, DPM;  Location: [facility] ORS;  Service: Podiatry;  Laterality: Left;   COLONOSCOPY WITH PROPOFOL N/A 02/01/2020   Procedure: COLONOSCOPY WITH PROPOFOL;  Surgeon: Lin Landsman, MD;  Location[facility] ENDOSCOPY;  Service: Gastroenterology;  Laterality: N/A;   ESOPHAGOGASTRODUODENOSCOPY  02/01/2020   Procedure: ESOPHAGOGASTRODUODENOSCOPY (EGD);  Surgeon: Lin Landsman, MD;  Location[facility] ENDOSCOPY;  Service: Gastroenterology;;   KNEE SURGERY Right    Torn meniscus   KNEE SURGERY Left      A IV Location/Drains/Wounds Patient Lines/Drains/Airways Status     Active Line/Drains/Airways     Name Placement date Placement time Site Days   Peripheral IV 06/06/22 20 G Posterior;Right Hand 06/06/22  0955  Hand  less than 1   Pressure Injury 03/10/22 Pretibial Distal;Left;Lateral 03/10/22  1700  -- 88   Wound / Incision (Open or Dehisced) 04/22/22 Other (Comment) Toe (Comment  which one) Right;Lateral;Mid Teressa Senter 04/22/22  1757  Toe (Comment  which one)  45   Wound / Incision (Open or Dehisced) 04/22/22 Other (Comment) Toe (Comment  which one) Anterior;Left;Upper;Circumferential Great toe tissue exposed 04/22/22  1759  Toe (Comment  which one)  45   Wound / Incision (Open or Dehisced) 04/22/22 Other (Comment) Toe (Comment  which one) Anterior;Left Second toe wound exposed tissue 04/22/22  1803  Toe (Comment  which one)  45            Intake/Output Last 24 hours No intake or output data in the 24 hours ending 06/06/22 1225  Labs/Imaging Results  for orders placed or performed during the hospital encounter of 06/06/22 (from the past 48 hour(s))  CBG monitoring, ED     Status: Abnormal   Collection Time: 06/06/22 10:01 AM  Result Value Ref Range   Glucose-Capillary 314 (H) 70 - 99 mg/dL    Comment: Glucose reference range applies only to samples taken after fasting for at least 8 hours.  Basic metabolic panel     Status: Abnormal   Collection Time: 06/06/22 10:04 AM  Result Value Ref Range   Sodium 137 135 - 145 mmol/L   Potassium 3.1 (L) 3.5 - 5.1 mmol/L   Chloride 103 98 - 111 mmol/L   CO2 18 (L) 22 - 32 mmol/L   Glucose, Bld 344 (H) 70 - 99 mg/dL    Comment: Glucose reference range applies only to samples taken after fasting for at least 8 hours.   BUN 54 (H) 8 - 23 mg/dL   Creatinine, Ser 1.80 (H) 0.61 - 1.24 mg/dL   Calcium 8.6 (L) 8.9 - 10.3 mg/dL   GFR, Estimated 40 (L) >60 mL/min    Comment: (NOTE) Calculated using the CKD-EPI Creatinine Equation (2021)    Anion gap 16 (H) 5 - 15    Comment: Performed at [facility], [place] [address] [place], [place] [address]  CBC with Differential (PNL)     Status: Abnormal   Collection Time: 06/06/22 10:04 AM  Result Value Ref Range   WBC 4.3 4.0 - 10.5 K/uL   RBC 2.06 (L) 4.22 - 5.81 MIL/uL   Hemoglobin 6.3 (LL) 13.0 - 17.0 g/dL    Comment: REPEATED TO VERIFY THIS CRITICAL RESULT HAS VERIFIED AND BEEN CALLED TO SMITH,A. RN BY NICOLE MCCOY ON 03 09 2024 AT 1056, AND HAS BEEN READ BACK.     HCT 20.5 (L) 39.0 - 52.0 %   MCV 99.5 80.0 - 100.0 fL   MCH 30.6 26.0 - 34.0 pg   MCHC 30.7 30.0 - 36.0 g/dL   RDW 17.1 (H) 11.5 - 15.5 %   Platelets 228 150 - 400 K/uL   nRBC 0.0 0.0 - 0.2 %   Neutrophils Relative % 64 %   Neutro Abs 2.7 1.7 - 7.7 K/uL   Lymphocytes Relative 22 %   Lymphs Abs 1.0 0.7 - 4.0 K/uL   Monocytes Relative 13 %   Monocytes Absolute 0.5 0.1 - 1.0 K/uL   Eosinophils Relative 0 %   Eosinophils Absolute 0.0 0.0 - 0.5 K/uL   Basophils  Relative 0 %   Basophils Absolute 0.0 0.0 - 0.1 K/uL   Immature Granulocytes 1 %   Abs Immature Granulocytes 0.02 0.00 - 0.07 K/uL    Comment: Performed at [facility], [place] [address], [place], [place] [address]  Beta-hydroxybutyric acid     Status: Abnormal   Collection Time: 06/06/22 10:04 AM  Result Value Ref Range   Beta-Hydroxybutyric Acid 3.06 (H) 0.05 - 0.27 mmol/L    Comment: Performed at [facility], [place] [address] [place], [place] [address]  Blood gas, venous  Status: Abnormal   Collection Time: 06/06/22 10:04 AM  Result Value Ref Range   pH, Ven 7.31 7.25 - 7.43   pCO2, Ven 43 (L) 44 - 60 mmHg   pO2, Ven <31 (LL) 32 - 45 mmHg    Comment: CRITICAL RESULT CALLED TO, READ BACK BY AND VERIFIED WITH: WILSON, A @ 1041 ON 06/06/2022 BY Jason Fila, K    Bicarbonate 21.7 20.0 - 28.0 mmol/L   Acid-base deficit 4.5 (H) 0.0 - 2.0 mmol/L   O2 Saturation 52 %   Patient temperature 37.0     Comment: Performed at [facility], [place] [address] [place], [place] [address]  Occult blood card to lab, stool     Status: Abnormal   Collection Time: 06/06/22 11:10 AM  Result Value Ref Range   Fecal Occult Bld POSITIVE (A) NEGATIVE    Comment: Performed at [facility], [place] [address] [place], [place] [address]  CBG monitoring, ED     Status: Abnormal   Collection Time: 06/06/22 11:34 AM  Result Value Ref Range   Glucose-Capillary 209 (H) 70 - 99 mg/dL    Comment: Glucose reference range applies only to samples taken after fasting for at least 8 hours.  Urinalysis, Routine w reflex microscopic -Urine, Clean Catch     Status: Abnormal   Collection Time: 06/06/22 11:44 AM  Result Value Ref Range   Color, Urine STRAW (A) YELLOW   APPearance CLEAR CLEAR   Specific Gravity, Urine 1.022 1.005 - 1.030   pH 5.0 5.0 - 8.0   Glucose, UA >=500 (A) NEGATIVE mg/dL   Hgb urine dipstick NEGATIVE NEGATIVE   Bilirubin Urine  NEGATIVE NEGATIVE   Ketones, ur 5 (A) NEGATIVE mg/dL   Protein, ur NEGATIVE NEGATIVE mg/dL   Nitrite NEGATIVE NEGATIVE   Leukocytes,Ua NEGATIVE NEGATIVE   RBC / HPF 0-5 0 - 5 RBC/hpf   WBC, UA 0-5 0 - 5 WBC/hpf   Bacteria, UA RARE (A) NONE SEEN   Squamous Epithelial / HPF 0-5 0 - 5 /HPF    Comment: Performed at [facility], [place] [address], [place], [place] [address]   No results found.  Pending Labs Unresulted Labs (From admission, onward)     Start     Ordered   06/07/22 0500  CBC  Tomorrow morning,   R        06/06/22 1214   06/06/22 [id number]  Basic metabolic panel  (Diabetes Ketoacidosis (DKA))  STAT Now then every 4 hours ,   R (with STAT occurrences)      06/06/22 1214   06/06/22 1213  Beta-hydroxybutyric acid  (Diabetes Ketoacidosis (DKA))  Now then every 8 hours,   R (with TIMED occurrences)      06/06/22 1214   06/06/22 1114  Type and screen [place]  (Blood Administration Adult)  Once,   STAT       Comments: [place]    06/06/22 1114   06/06/22 1114  Prepare RBC (crossmatch)  (Blood Administration Adult)  Once,   R       Question Answer Comment  # of Units 1 unit   Transfusion Indications Hemoglobin < 7 gm/dL and symptomatic   Number of Units to Keep Ahead NO units ahead   Instructions: Transfuse   If emergent release call blood bank Not emergent release      06/06/22 1114   06/06/22 0944  Osmolality  (Hyperglycemia (not DKA or  HHS))  Once,   URGENT        06/06/22 0944            Vitals/Pain Today's Vitals   06/06/22 0938 06/06/22 0939  BP: (!) 100/58   Pulse: 95   Resp: 18   Temp: 98.1 F (36.7 C)   SpO2: 100%   Weight:  65.3 kg  Height:  '5\' 10"'$  (1.778 m)  PainSc:  0-No pain    Isolation Precautions No active isolations  Medications Medications  0.9 %  sodium chloride infusion (Manually program via Guardrails IV Fluids) (has no administration in time range)  pantoprazole (PROTONIX)  80 mg /NS 100 mL IVPB (80 mg Intravenous New Bag/Given 06/06/22 1223)  pantoprozole (PROTONIX) 80 mg /NS 100 mL infusion (has no administration in time range)  pantoprazole (PROTONIX) injection 40 mg (has no administration in time range)  potassium chloride SA (KLOR-CON M) CR tablet 40 mEq (has no administration in time range)  insulin regular, human (MYXREDLIN) 100 units/ 100 mL infusion (has no administration in time range)  lactated ringers infusion (has no administration in time range)  dextrose 5 % in lactated ringers infusion (has no administration in time range)  dextrose 50 % solution 0-50 mL (has no administration in time range)  lactated ringers bolus 1,306 mL (0 mLs Intravenous Stopped 06/06/22 1148)    Mobility walks with device     Focused Assessments Cardiac Assessment Handoff:    Lab Results  Component Value Date   CKTOTAL 36 (L) 05/18/2022   TROPONINI 0.04 08/02/2018   Lab Results  Component Value Date   DDIMER 1.07 (H) 04/08/2022   Does the Patient currently have chest pain? No    R Recommendations: See Admitting Provider Note  Report given to:   Additional Notes:

## 2022-06-06 NOTE — Progress Notes (Signed)
Peripherally Inserted Central Catheter Placement  The IV Nurse has discussed with the patient and/or persons authorized to consent for the patient, the purpose of this procedure and the potential benefits and risks involved with this procedure.  The benefits include less needle sticks, lab draws from the catheter, and the patient may be discharged home with the catheter. Risks include, but not limited to, infection, bleeding, blood clot (thrombus formation), and puncture of an artery; nerve damage and irregular heartbeat and possibility to perform a PICC exchange if needed/ordered by physician.  Alternatives to this procedure were also discussed.  Bard Power PICC patient education guide, fact sheet on infection prevention and patient information card has been provided to patient /or left at bedside.    PICC Placement Documentation  PICC Triple Lumen 06/06/22 Right Brachial 38 cm 1 cm (Active)  Exposed Catheter (cm) 1 cm 06/06/22 1642  Site Assessment Clean, Dry, Intact 06/06/22 1642  Lumen #1 Status Saline locked;Flushed;Blood return noted 06/06/22 1642  Lumen #2 Status Saline locked;Flushed;Blood return noted 06/06/22 1642  Lumen #3 Status Saline locked;Flushed;Blood return noted 06/06/22 1642  Dressing Type Transparent;Securing device 06/06/22 1642  Dressing Status Antimicrobial disc in place;Clean, Dry, Intact 06/06/22 1642  Safety Lock Not Applicable [id number]  Line Care Connections checked and tightened 06/06/22 1642  Line Adjustment (NICU/IV Team Only) No 06/06/22 1642  Dressing Intervention New dressing 06/06/22 1642  Dressing Change Due 06/13/22 06/06/22 [place] 06/06/2022, 4:43 PM

## 2022-06-06 NOTE — Progress Notes (Signed)
Dear Doctor: This patient has been identified as a candidate for PICC for the following reason (s): poor veins/poor circulatory system (CHF, COPD, emphysema, diabetes, steroid use, IV drug abuse, etc.) If you agree, please write an order for the indicated device. For any questions contact the Vascular Access Team at [phone] if no answer, please leave a message.  Thank you for supporting the early vascular access assessment program. HS Truman Hayward RN

## 2022-06-06 NOTE — ED Triage Notes (Signed)
Pt to ER via EMS from [facility] with reports of BS 596.  Pt rechecked by EMS 577.  Pt is type 1 diabetic, was given regular medications with no change.  Pt states has been constipated, had BM yesterday.  Denies pain or other concerns at this time.

## 2022-06-07 ENCOUNTER — Encounter (HOSPITAL_COMMUNITY): Payer: Self-pay | Admitting: Internal Medicine

## 2022-06-07 ENCOUNTER — Inpatient Hospital Stay (HOSPITAL_COMMUNITY): Payer: 59 | Admitting: Anesthesiology

## 2022-06-07 ENCOUNTER — Encounter (HOSPITAL_COMMUNITY)
Admission: EM | Disposition: A | Discharge status: Home Health | Payer: Self-pay | Source: Skilled Nursing Facility | Attending: Internal Medicine

## 2022-06-07 DIAGNOSIS — D631 Anemia in chronic kidney disease: Secondary | ICD-10-CM

## 2022-06-07 DIAGNOSIS — K3189 Other diseases of stomach and duodenum: Secondary | ICD-10-CM

## 2022-06-07 DIAGNOSIS — I129 Hypertensive chronic kidney disease with stage 1 through stage 4 chronic kidney disease, or unspecified chronic kidney disease: Secondary | ICD-10-CM

## 2022-06-07 DIAGNOSIS — N183 Chronic kidney disease, stage 3 unspecified: Secondary | ICD-10-CM

## 2022-06-07 DIAGNOSIS — N179 Acute kidney failure, unspecified: Secondary | ICD-10-CM

## 2022-06-07 DIAGNOSIS — K922 Gastrointestinal hemorrhage, unspecified: Secondary | ICD-10-CM | POA: Diagnosis not present

## 2022-06-07 DIAGNOSIS — K279 Peptic ulcer, site unspecified, unspecified as acute or chronic, without hemorrhage or perforation: Secondary | ICD-10-CM

## 2022-06-07 HISTORY — PX: ESOPHAGOGASTRODUODENOSCOPY (EGD) WITH PROPOFOL: SHX5813

## 2022-06-07 LAB — CBC
HCT: 19.1 % — ABNORMAL LOW (ref 39.0–52.0)
HCT: 30 % — ABNORMAL LOW (ref 39.0–52.0)
Hemoglobin: 6.1 g/dL — CL (ref 13.0–17.0)
Hemoglobin: 9.9 g/dL — ABNORMAL LOW (ref 13.0–17.0)
MCH: 30 pg (ref 26.0–34.0)
MCH: 29.9 pg (ref 26.0–34.0)
MCHC: 31.9 g/dL (ref 30.0–36.0)
MCHC: 33 g/dL (ref 30.0–36.0)
MCV: 94.1 fL (ref 80.0–100.0)
MCV: 90.6 fL (ref 80.0–100.0)
Platelets: 179 10*3/uL (ref 150–400)
Platelets: 196 10*3/uL (ref 150–400)
RBC: 2.03 MIL/uL — ABNORMAL LOW (ref 4.22–5.81)
RBC: 3.31 MIL/uL — ABNORMAL LOW (ref 4.22–5.81)
RDW: 18.4 % — ABNORMAL HIGH (ref 11.5–15.5)
RDW: 17.5 % — ABNORMAL HIGH (ref 11.5–15.5)
WBC: 4.1 10*3/uL (ref 4.0–10.5)
WBC: 7.1 10*3/uL (ref 4.0–10.5)
nRBC: 0 % (ref 0.0–0.2)
nRBC: 0.6 % — ABNORMAL HIGH (ref 0.0–0.2)

## 2022-06-07 LAB — BASIC METABOLIC PANEL
Anion gap: 6 (ref 5–15)
BUN: 45 mg/dL — ABNORMAL HIGH (ref 8–23)
CO2: 25 mmol/L (ref 22–32)
Calcium: 8.3 mg/dL — ABNORMAL LOW (ref 8.9–10.3)
Chloride: 110 mmol/L (ref 98–111)
Creatinine, Ser: 1.35 mg/dL — ABNORMAL HIGH (ref 0.61–1.24)
GFR, Estimated: 57 mL/min — ABNORMAL LOW (ref >60)
Glucose, Bld: 112 mg/dL — ABNORMAL HIGH (ref 70–99)
Potassium: 3.7 mmol/L (ref 3.5–5.1)
Sodium: 141 mmol/L (ref 135–145)

## 2022-06-07 LAB — GLUCOSE, CAPILLARY
Glucose-Capillary: 113 mg/dL — ABNORMAL HIGH (ref 70–99)
Glucose-Capillary: 78 mg/dL (ref 70–99)
Glucose-Capillary: 87 mg/dL (ref 70–99)
Glucose-Capillary: 232 mg/dL — ABNORMAL HIGH (ref 70–99)
Glucose-Capillary: 149 mg/dL — ABNORMAL HIGH (ref 70–99)

## 2022-06-07 LAB — PREPARE RBC (CROSSMATCH)

## 2022-06-07 SURGERY — ESOPHAGOGASTRODUODENOSCOPY (EGD) WITH PROPOFOL
Anesthesia: Monitor Anesthesia Care

## 2022-06-07 MED ORDER — INSULIN DEGLUDEC 100 UNIT/ML ~~LOC~~ SOPN: 20.0000 [IU] | PEN_INJECTOR | Freq: Every day | SUBCUTANEOUS | Status: DC

## 2022-06-07 MED ORDER — METOPROLOL TARTRATE 5 MG/5ML IV SOLN
5.0000 mg | Freq: Once | INTRAVENOUS | Status: AC
  Administered 2022-06-07: 5 mg via INTRAVENOUS
  Filled 2022-06-07: qty 5

## 2022-06-07 MED ORDER — GUAIFENESIN-DM 100-10 MG/5ML PO SYRP: 10.0000 mL | ORAL_SOLUTION | ORAL | Status: DC | PRN

## 2022-06-07 MED ORDER — HYDRALAZINE HCL 20 MG/ML IJ SOLN
20.0000 mg | Freq: Three times a day (TID) | INTRAMUSCULAR | Status: DC | PRN
  Administered 2022-06-08: 20 mg via INTRAVENOUS
  Filled 2022-06-07: qty 1

## 2022-06-07 MED ORDER — DOCUSATE SODIUM 100 MG PO CAPS: 100.0000 mg | ORAL_CAPSULE | Freq: Every day | ORAL | Status: DC | PRN

## 2022-06-07 MED ORDER — FERROUS SULFATE 325 (65 FE) MG PO TABS
325.0000 mg | ORAL_TABLET | Freq: Every day | ORAL | Status: DC
  Administered 2022-06-08 – 2022-06-12 (×5): 325 mg via ORAL
  Filled 2022-06-07 (×5): qty 1

## 2022-06-07 MED ORDER — ASPIRIN 81 MG PO TBEC
81.0000 mg | DELAYED_RELEASE_TABLET | Freq: Every morning | ORAL | Status: DC
  Administered 2022-06-08 – 2022-06-12 (×5): 81 mg via ORAL
  Filled 2022-06-07 (×5): qty 1

## 2022-06-07 MED ORDER — TAMSULOSIN HCL 0.4 MG PO CAPS
0.4000 mg | ORAL_CAPSULE | Freq: Every day | ORAL | Status: DC
  Administered 2022-06-07 – 2022-06-12 (×6): 0.4 mg via ORAL
  Filled 2022-06-07 (×6): qty 1

## 2022-06-07 MED ORDER — ACETAMINOPHEN 325 MG PO TABS
650.0000 mg | ORAL_TABLET | Freq: Four times a day (QID) | ORAL | Status: DC | PRN
  Administered 2022-06-07 – 2022-06-12 (×10): 650 mg via ORAL
  Filled 2022-06-07 (×11): qty 2

## 2022-06-07 MED ORDER — ONDANSETRON HCL 4 MG PO TABS: 8.0000 mg | ORAL_TABLET | Freq: Every day | ORAL | Status: DC | PRN

## 2022-06-07 MED ORDER — ADULT MULTIVITAMIN W/MINERALS CH
1.0000 | ORAL_TABLET | Freq: Every day | ORAL | Status: DC
  Administered 2022-06-08 – 2022-06-12 (×5): 1 via ORAL
  Filled 2022-06-07 (×5): qty 1

## 2022-06-07 MED ORDER — ATORVASTATIN CALCIUM 40 MG PO TABS
80.0000 mg | ORAL_TABLET | Freq: Every day | ORAL | Status: DC
  Administered 2022-06-07 – 2022-06-11 (×5): 80 mg via ORAL
  Filled 2022-06-07 (×5): qty 2

## 2022-06-07 MED ORDER — GLUCERNA SHAKE PO LIQD
237.0000 mL | Freq: Three times a day (TID) | ORAL | Status: DC
  Administered 2022-06-07 – 2022-06-12 (×14): 237 mL via ORAL
  Filled 2022-06-07 (×17): qty 237

## 2022-06-07 MED ORDER — PROPOFOL 10 MG/ML IV BOLUS
INTRAVENOUS | Status: DC | PRN
  Administered 2022-06-07: 50 mg via INTRAVENOUS

## 2022-06-07 MED ORDER — CLOPIDOGREL BISULFATE 75 MG PO TABS
75.0000 mg | ORAL_TABLET | Freq: Every morning | ORAL | Status: DC
  Administered 2022-06-08 – 2022-06-12 (×5): 75 mg via ORAL
  Filled 2022-06-07 (×6): qty 1

## 2022-06-07 MED ORDER — HYDRALAZINE HCL 20 MG/ML IJ SOLN
10.0000 mg | Freq: Once | INTRAMUSCULAR | Status: AC
  Administered 2022-06-07: 10 mg via INTRAVENOUS
  Filled 2022-06-07: qty 1

## 2022-06-07 MED ORDER — BACLOFEN 10 MG PO TABS: 5.0000 mg | ORAL_TABLET | Freq: Every day | ORAL | Status: DC | PRN

## 2022-06-07 MED ORDER — HYDROCODONE-ACETAMINOPHEN 5-325 MG PO TABS
1.0000 | ORAL_TABLET | Freq: Four times a day (QID) | ORAL | Status: DC | PRN
  Administered 2022-06-07: 1 via ORAL
  Filled 2022-06-07: qty 1

## 2022-06-07 MED ORDER — PROPOFOL 500 MG/50ML IV EMUL
INTRAVENOUS | Status: AC
  Filled 2022-06-07: qty 50

## 2022-06-07 MED ORDER — ACYCLOVIR 200 MG PO CAPS: 400.0000 mg | ORAL_CAPSULE | Freq: Two times a day (BID) | ORAL | Status: DC

## 2022-06-07 MED ORDER — HYDRALAZINE HCL 20 MG/ML IJ SOLN
20.0000 mg | Freq: Once | INTRAMUSCULAR | Status: AC
  Administered 2022-06-07: 20 mg via INTRAVENOUS

## 2022-06-07 MED ORDER — HYDRALAZINE HCL 20 MG/ML IJ SOLN: 5.0000 mg | Freq: Three times a day (TID) | INTRAMUSCULAR | Status: DC | PRN

## 2022-06-07 MED ORDER — INSULIN ASPART 100 UNIT/ML IJ SOLN: 0.0000 [IU] | Freq: Three times a day (TID) | INTRAMUSCULAR | Status: DC

## 2022-06-07 MED ORDER — HYDRALAZINE HCL 20 MG/ML IJ SOLN
INTRAMUSCULAR | Status: AC
  Filled 2022-06-07: qty 1

## 2022-06-07 MED ORDER — SODIUM CHLORIDE 0.9 % IV SOLN: INTRAVENOUS | Status: DC

## 2022-06-07 MED ORDER — PROPOFOL 500 MG/50ML IV EMUL
INTRAVENOUS | Status: DC | PRN
  Administered 2022-06-07: 100 ug/kg/min via INTRAVENOUS

## 2022-06-07 MED ORDER — ZINC SULFATE 220 (50 ZN) MG PO CAPS
220.0000 mg | ORAL_CAPSULE | Freq: Every day | ORAL | Status: DC
  Administered 2022-06-08 – 2022-06-12 (×5): 220 mg via ORAL
  Filled 2022-06-07 (×5): qty 1

## 2022-06-07 MED ORDER — DAPAGLIFLOZIN PROPANEDIOL 10 MG PO TABS
10.0000 mg | ORAL_TABLET | Freq: Every morning | ORAL | Status: DC
  Administered 2022-06-08 – 2022-06-11 (×4): 10 mg via ORAL
  Filled 2022-06-07 (×5): qty 1

## 2022-06-07 MED ORDER — INSULIN GLARGINE-YFGN 100 UNIT/ML ~~LOC~~ SOLN
10.0000 [IU] | SUBCUTANEOUS | Status: DC
  Administered 2022-06-07: 10 [IU] via SUBCUTANEOUS
  Filled 2022-06-07: qty 0.1

## 2022-06-07 MED ORDER — VITAMIN C 500 MG PO TABS
500.0000 mg | ORAL_TABLET | Freq: Every day | ORAL | Status: DC
  Administered 2022-06-08 – 2022-06-12 (×5): 500 mg via ORAL
  Filled 2022-06-07 (×5): qty 1

## 2022-06-07 MED ORDER — PANTOPRAZOLE SODIUM 40 MG IV SOLR
40.0000 mg | INTRAVENOUS | Status: DC
  Administered 2022-06-07 – 2022-06-12 (×6): 40 mg via INTRAVENOUS
  Filled 2022-06-07 (×5): qty 10

## 2022-06-07 SURGICAL SUPPLY — 15 items

## 2022-06-07 NOTE — Anesthesia Preprocedure Evaluation (Addendum)
Anesthesia Evaluation  Patient identified by MRN, date of birth, ID band Patient awake    Reviewed: Allergy & Precautions, NPO status , Patient's Chart, lab work & pertinent test results  History of Anesthesia Complications Negative for: history of anesthetic complications  Airway Mallampati: I  TM Distance: >3 FB Neck ROM: full    Dental no notable dental hx. (+) Edentulous Upper, Edentulous Lower   Pulmonary neg pulmonary ROS   Pulmonary exam normal breath sounds clear to auscultation       Cardiovascular hypertension, Pt. on medications and Pt. on home beta blockers Normal cardiovascular exam+ dysrhythmias (First degree AV block \) Atrial Fibrillation  Rhythm:Regular Rate:Normal     Neuro/Psych CVA  negative psych ROS   GI/Hepatic PUD,,,(+)     substance abuse  cocaine use and marijuana use  Endo/Other  diabetes, Type 1, Insulin Dependent  DKA on admission, now resolved   Renal/GU ARF and CRFRenal disease     Musculoskeletal   Abdominal   Peds  Hematology  (+) Blood dyscrasia, anemia Multiple myeloma  Strep bacteremia    Anesthesia Other Findings    Reproductive/Obstetrics negative OB ROS                             Anesthesia Physical Anesthesia Plan  ASA: 4 and emergent  Anesthesia Plan: MAC   Post-op Pain Management: Minimal or no pain anticipated   Induction: Intravenous  PONV Risk Score and Plan: 2 and Ondansetron, Dexamethasone and Treatment may vary due to age or medical condition  Airway Management Planned: Natural Airway, Simple Face Mask and Mask  Additional Equipment: None  Intra-op Plan:   Post-operative Plan: Extubation in OR  Informed Consent: I have reviewed the patients History and Physical, chart, labs and discussed the procedure including the risks, benefits and alternatives for the proposed anesthesia with the patient or authorized representative who  has indicated his/her understanding and acceptance.     Dental Advisory Given  Plan Discussed with: Anesthesiologist and CRNA  Anesthesia Plan Comments: (Heme/Onc history:  Patient is a 72 year old African-American male with a past medical history significant for uncontrolled type 1 diabetes, chronic kidney disease stage III chronic normocytic anemia referred for abnormal SPEP. Patient's creatinine has been fluctuating between 1.5-2 but about 5 months ago it went up all the way to 5 and his blood sugars were in the 1000 range. More recently his kidney numbers have been drifting back to normal values. As a part of the work-up he had serum protein electrophoresis done which showed an elevated gammaglobulin fraction with an M spike of 3%. The amount of M spike was not quantified in the specimen. He has therefore been referred to [place] for further management. Patient endorses chronic fatigue reports that his appetite and weight have remained stable   Results of blood work from 04/08/2020 were as follows: CBC showed white count of 2.9, H&H of 10.5/31.2 with an MCV of 91 and a platelet count of 191.  CMP showed a mildly elevated creatinine of 1.2 which was better as compared to 5 months ago when it was 3.9.  Total protein was mildly elevated at 9.1 calcium normal at 8.4 ferritin and iron studies B12 folate TSH and haptoglobin were normal.  Myeloma panel revealed an elevated IgG level of 06/04/2004 with an M protein of 3.1 g.  Immunofixation showed IgG lambda specificity.  Serum free light chain ratio was elevated at 25 and free light  chain lambda elevated at 370   Further myeloma work-up including a PET CT scan did not reveal any evidence of edematous lesions.  Bone marrow biopsy showed 23% plasma cells by manual count and 30% by CD138 IHC.  Normal cytogenetics.  FISH studies for myeloma showed gain of 1 q.  13 q-. detected.  P53 not detected.   He was treated for a year and has having smoldering multiple myeloma  since both CKD and anemia have been stable for 3 to 4 years and could be secondary to uncontrolled diabetes.In June 2023 IgG levels increased to 5463 with an M protein of 4.1 g as compared to 2.6 g in September 2022.  Serum free light chain ratio remains around 25.  Therefore a repeat bone marrow biopsy was done which shows further increase in plasma cells ranging from 30 to 70% and by immunohistochemistry 60 to 70% of the cells were positive for CD138.  Cytogenetics normal.  FISH study showed 4: 14 translocation and gain of 1 q. making this high risk RISS stage III.  Repeat PET scan showed no lytic lesions   Patient is not a transplant candidate and was started on Velcade Revlimid dexamethasone regimen in July 2023.  Treatment complicated by repeated hospitalizations for uncontrolled blood sugars. )        Anesthesia Quick Evaluation

## 2022-06-07 NOTE — Progress Notes (Signed)
PROGRESS NOTE    Nicholas Weiss  [id number] DOB: 08-Aug-1950 DOA: 06/06/2022 PCP: Dion Body, MD    Brief Narrative:  Nicholas Weiss is a 72 y.o. male with medical history significant of type 1 diabetes, hypertension, recent amputation of his left toes.  Patient recently at [facility] with falls due to orthostatic hypotension, hypertensive urgency, osteomyelitis with partial second toe amputation, and subdural hematoma.  He was discharged from [facility] to [facility].  This a.m. at [facility] he was noted to have a glucose reading of greater than 500.  He was sent in for further evaluation.  In the ER, he was found to have a low hemoglobin and was found to have melanotic stools.  ER PA spoke with GI who plans to scope patient in the morning.  In the ER patient also was found to have hyperglycemia with anion gap of 16 and a beta hydroxybutyrate acid of greater than 3.  Patient does state he has occasional nausea but denies chest pain, shortness of breath, headache.    Assessment and Plan: DKA in a type 1 DM (once both insulin and oral meds at home?) -resolved -start SSI and long acting adjust as needed    Hypokalemia -repleted   GI bleed -EGD: Protonix (pantoprazole) 40 mg PO daily for 2 months.                           - Ok to resume ASA and plavix as needed.                           - Rectal exam showed formed dark green stools, NOT                            melanotic, anemia could be related to history of                            Multiple Myeloma. -s/p 3 units PRBC   H/o substance abuse - in a facility so doubt actively using   AKI -resolved   H/o orthostatic hypotension -resolved -d/c midodrine and florinef   Multiple myeloma -outpatient follow up  DVT prophylaxis: SCDs Start: 06/06/22 1213    Code Status: Full Code   Disposition Plan:  Level of care: Telemetry Status is: Inpatient Remains inpatient appropriate because: needs return to SNF     Consultants:  GI   Subjective: No SOB, c/o headache  Objective: Vitals:   06/07/22 0946 06/07/22 0950 06/07/22 1000 06/07/22 1134  BP: 109/78 (!) 144/73  (!) 154/96  Pulse: 76 77 78 78  Resp: (!) 8 10 (!) 8 16  Temp: 98 F (36.7 C)   98.5 F (36.9 C)  TempSrc: Oral   Oral  SpO2: 100% 100% 100% 97%  Weight:      Height:        Intake/Output Summary (Last 24 hours) at 06/07/2022 1203 Last data filed at 06/07/2022 1018 Gross per 24 hour  Intake 2344.84 ml  Output 1650 ml  Net 694.84 ml   Filed Weights   06/06/22 0939 06/07/22 0835  Weight: 65.3 kg 65.3 kg    Examination:   General: Appearance:    Well developed, well nourished male in no acute distress     Lungs:     Clear to auscultation bilaterally, respirations  unlabored  Heart:    Normal heart rate. Normal rhythm. No murmurs, rubs, or gallops.    MS:   Left 2nd toe amputated.    Neurologic:   Awake, alert, oriented x 3. No apparent focal neurological           defect.        Data Reviewed: I have personally reviewed following labs and imaging studies  CBC: Recent Labs  Lab 06/01/22 0850 06/06/22 1004 06/06/22 2218 06/07/22 0016 06/07/22 1000  WBC 3.3* 4.3 4.2 4.1 7.1  NEUTROABS 2.2 2.7  --   --   --   HGB 8.3* 6.3* 6.0* 6.1* 9.9*  HCT 26.3* 20.5* 18.6* 19.1* 30.0*  MCV 94.9 99.5 94.9 94.1 90.6  PLT 262 228 173 179 [id number]   Basic Metabolic Panel: Recent Labs  Lab 06/06/22 1004 06/06/22 1454 06/06/22 2017 06/07/22 0520  NA 137 134* 142 141  K 3.1* 4.2 3.6 3.7  CL 103 104 108 110  CO2 18* '23 26 25  '$ GLUCOSE 344* 421* 109* 112*  BUN 54* 53* 51* 45*  CREATININE 1.80* 1.65* 1.67* 1.35*  CALCIUM 8.6* 7.8* 8.5* 8.3*   GFR: Estimated Creatinine Clearance: 47.7 mL/min (A) (by C-G formula based on SCr of 1.35 mg/dL (H)). Liver Function Tests: No results for input(s): "AST", "ALT", "ALKPHOS", "BILITOT", "PROT", "ALBUMIN" in the last 168 hours. No results for input(s): "LIPASE", "AMYLASE" in the  last 168 hours. No results for input(s): "AMMONIA" in the last 168 hours. Coagulation Profile: No results for input(s): "INR", "PROTIME" in the last 168 hours. Cardiac Enzymes: No results for input(s): "CKTOTAL", "CKMB", "CKMBINDEX", "TROPONINI" in the last 168 hours. BNP (last 3 results) No results for input(s): "PROBNP" in the last 8760 hours. HbA1C: No results for input(s): "HGBA1C" in the last 72 hours. CBG: Recent Labs  Lab 06/06/22 2113 06/06/22 2216 06/06/22 2308 06/07/22 0014 06/07/22 0110  GLUCAP 83 71 78 87 113*   Lipid Profile: No results for input(s): "CHOL", "HDL", "LDLCALC", "TRIG", "CHOLHDL", "LDLDIRECT" in the last 72 hours. Thyroid Function Tests: No results for input(s): "TSH", "T4TOTAL", "FREET4", "T3FREE", "THYROIDAB" in the last 72 hours. Anemia Panel: No results for input(s): "VITAMINB12", "FOLATE", "FERRITIN", "TIBC", "IRON", "RETICCTPCT" in the last 72 hours. Sepsis Labs: No results for input(s): "PROCALCITON", "LATICACIDVEN" in the last 168 hours.  Recent Results (from the past 240 hour(s))  MRSA Next Gen by PCR, Nasal     Status: None   Collection Time: 06/06/22  2:50 PM   Specimen: Nasal Mucosa; Nasal Swab  Result Value Ref Range Status   MRSA by PCR Next Gen NOT DETECTED NOT DETECTED Final    Comment: (NOTE) The GeneXpert MRSA Assay (FDA approved for NASAL specimens only), is one component of a comprehensive MRSA colonization surveillance program. It is not intended to diagnose MRSA infection nor to guide or monitor treatment for MRSA infections. Test performance is not FDA approved in patients less than 26 years old. Performed at [facility], [place] [address], [place], [place] [address]          Radiology Studies: [place] EKG SITE RITE  Result Date: 06/06/2022 If Site Rite image not attached, placement could not be confirmed due to current cardiac rhythm.       Scheduled Meds:  sodium chloride   Intravenous Once    acyclovir  400 mg Oral BID   ascorbic acid  500 mg Oral Daily   aspirin EC  81 mg Oral q AM  atorvastatin  80 mg Oral QHS   Chlorhexidine Gluconate Cloth  6 each Topical Daily   clopidogrel  75 mg Oral q AM   dapagliflozin propanediol  10 mg Oral q AM   feeding supplement (GLUCERNA SHAKE)  237 mL Oral TID BM   [START ON 06/08/2022] ferrous sulfate  325 mg Oral Q breakfast   insulin aspart  0-5 Units Subcutaneous QHS   insulin aspart  0-9 Units Subcutaneous TID WC   insulin aspart  0-9 Units Subcutaneous TID WC   insulin degludec  20 Units Subcutaneous Daily   insulin glargine-yfgn  5 Units Subcutaneous Q24H   multivitamin with minerals  1 tablet Oral Daily   pantoprazole  40 mg Intravenous Q24H   sodium chloride flush  10-40 mL Intracatheter Q12H   tamsulosin  0.4 mg Oral Daily   zinc sulfate  220 mg Oral Daily   Continuous Infusions:  sodium chloride 10 mL/hr at 06/07/22 1018     LOS: 1 day    Time spent: 35 minutes spent on chart review, discussion with nursing staff, consultants, updating family and interview/physical exam; more than 50% of that time was spent in counseling and/or coordination of care.    Geradine Girt, DO [facility] Available via Epic secure chat 7am-7pm After these hours, please refer to coverage provider listed on [email or web address] 06/07/2022, 12:03 PM

## 2022-06-07 NOTE — Transfer of Care (Signed)
Immediate Anesthesia Transfer of Care Note  Patient: Nicholas Weiss  Procedure(s) Performed: ESOPHAGOGASTRODUODENOSCOPY (EGD) WITH PROPOFOL  Patient Location: PACU  Anesthesia Type:MAC  Level of Consciousness: drowsy and patient cooperative  Airway & Oxygen Therapy: Patient Spontanous Breathing and Patient connected to face mask oxygen  Post-op Assessment: Report given to RN and Post -op Vital signs reviewed and stable  Post vital signs: Reviewed and stable  Last Vitals:  Vitals Value Taken Time  BP 125/73 06/07/22 0924  Temp    Pulse 65 06/07/22 0925  Resp 16 06/07/22 0925  SpO2 100 % 06/07/22 0925  Vitals shown include unvalidated device data.  Last Pain:  Vitals:   06/07/22 0835  TempSrc: Temporal  PainSc: 0-No pain         Complications: No notable events documented.

## 2022-06-07 NOTE — Anesthesia Procedure Notes (Signed)
Procedure Name: MAC Date/Time: 06/07/2022 9:10 AM  Performed by: Renato Shin, CRNAPre-anesthesia Checklist: Patient identified, Emergency Drugs available, Suction available and Patient being monitored Patient Re-evaluated:Patient Re-evaluated prior to induction Oxygen Delivery Method: Simple face mask Preoxygenation: Pre-oxygenation with 100% oxygen Induction Type: IV induction Airway Equipment and Method: Bite block Placement Confirmation: positive ETCO2 and breath sounds checked- equal and bilateral Dental Injury: Teeth and Oropharynx as per pre-operative assessment

## 2022-06-07 NOTE — Plan of Care (Signed)
  Problem: Activity: Goal: Ability to tolerate increased activity will improve Outcome: Progressing   Problem: Coping: Goal: Ability to adjust to condition or change in health will improve Outcome: Progressing   Problem: Fluid Volume: Goal: Ability to maintain a balanced intake and output will improve Outcome: Progressing   

## 2022-06-07 NOTE — Op Note (Signed)
[facility] Patient Name: Nicholas Weiss Procedure Date: 06/07/2022 MRN: [id number] Attending MD: Ronnette Juniper , MD, [id number] Date of Birth: [birth year] CSN: [id number] Age: 72 Admit Type: Inpatient Procedure:                Upper GI endoscopy Indications:              Melena, Anemia Providers:                Ronnette Juniper, MD, Jeanella Cara, RN, Benetta Spar, Technician Referring MD:             [facility] Medicines:                Monitored Anesthesia Care Complications:            No immediate complications. Estimated Blood Loss:     Estimated blood loss: none. Procedure:                Pre-Anesthesia Assessment:                           - Prior to the procedure, a History and Physical                            was performed, and patient medications and                            allergies were reviewed. The patient's tolerance of                            previous anesthesia was also reviewed. The risks                            and benefits of the procedure and the sedation                            options and risks were discussed with the patient.                            All questions were answered, and informed consent                            was obtained. Prior Anticoagulants: The patient has                            taken Plavix (clopidogrel), timing of last dose                            unknown. ASA Grade Assessment: IV - A patient with                            severe systemic disease that is a constant threat  to life. After reviewing the risks and benefits,                            the patient was deemed in satisfactory condition to                            undergo the procedure.                           After obtaining informed consent, the endoscope was                            passed under direct vision. Throughout the                            procedure, the  patient's blood pressure, pulse, and                            oxygen saturations were monitored continuously. The                            [id number] Olympus endoscope was introduced                            through the mouth, and advanced to the second part                            of duodenum. The upper GI endoscopy was                            accomplished without difficulty. The patient                            tolerated the procedure well. Scope In: Scope Out: Findings:      The examined esophagus was normal.      The Z-line was regular and was found 38 cm from the incisors.      A few dispersed small erosions with no bleeding and no stigmata of       recent bleeding were found in the cardia, in the gastric fundus and in       the gastric body.      The examined duodenum was normal. Impression:               - Normal esophagus.                           - Z-line regular, 38 cm from the incisors.                           - Erosive gastropathy with no bleeding and no                            stigmata of recent bleeding.                           -  Normal examined duodenum.                           - No specimens collected. Moderate Sedation:      Patient did not receive moderate sedation for this procedure, but       instead received monitored anesthesia care. Recommendation:           - Resume regular diet.                           - Use Protonix (pantoprazole) 40 mg PO daily for 2                            months.                           - Ok to resume ASA and plavix as needed.                           - Rectal exam showed formed dark green stools, NOT                            melanotic, anemia could be related to history of                            Multiple Myeloma. Procedure Code(s):        --- Professional ---                           [phone], Esophagogastroduodenoscopy, flexible,                            transoral; diagnostic, including  collection of                            specimen(s) by brushing or washing, when performed                            (separate procedure) Diagnosis Code(s):        --- Professional ---                           K31.89, Other diseases of stomach and duodenum                           K92.1, Melena (includes Hematochezia) CPT copyright 2022 [employer]. All rights reserved. The codes documented in this report are preliminary and upon coder review may  be revised to meet current compliance requirements. Ronnette Juniper, MD 06/07/2022 9:26:42 AM This report has been signed electronically. Number of Addenda: 0

## 2022-06-07 NOTE — Consult Note (Signed)
[facility] Gastroenterology Consult  Referring Provider: ER Primary Care Physician:  Dion Body, MD Primary Gastroenterologist: Althia Forts  Reason for Consultation: Melena, anemia  HPI: Nicholas Weiss is a 72 y.o. male sent from rehab with hyperglycemia, blood sugar of over 500.  In the ER patient was found to have low hemoglobin. Patient states he had 1 episode of black stools. Denies noticing fresh blood in stool. Denies abdominal pain, reports 1 episode of nausea and vomiting(nonbloody, no coffee-ground emesis). Patient is supposed to be on aspirin and Plavix, cannot recall when he last took those medications. Patient states since his dentures were removed, he has lost about 30 pounds over.  Of 1 month which is unintentional. He denies early satiety, difficulty swallowing or pain on swallowing. He denies change in bowel habits.  Prior GI workup: EGD, 02/01/2020, [facility], dysphagia: H. pylori associated gastritis, gastric ulcers noted, no evidence of EOE Colonoscopy, 02/01/2020, [facility], screening: Internal hemorrhoids, suboptimal prep, repeat recommended in 1 year  Past Medical History:  Diagnosis Date   Acute metabolic encephalopathy [id number]   DKA (diabetic ketoacidoses) 04/06/2016   Hypercholesteremia    Hypertension     Past Surgical History:  Procedure Laterality Date   AMPUTATION TOE Left 04/25/2022   Procedure: LEFT GREAT TOE AMPUTATION AND LEFT PARTIAL 2ND TOE AMPUTATION;  Surgeon: Edrick Kins, DPM;  Location: [facility] ORS;  Service: Podiatry;  Laterality: Left;   COLONOSCOPY WITH PROPOFOL N/A 02/01/2020   Procedure: COLONOSCOPY WITH PROPOFOL;  Surgeon: Lin Landsman, MD;  Location: [facility] ENDOSCOPY;  Service: Gastroenterology;  Laterality: N/A;   ESOPHAGOGASTRODUODENOSCOPY  02/01/2020   Procedure: ESOPHAGOGASTRODUODENOSCOPY (EGD);  Surgeon: Lin Landsman, MD;  Location: [facility] ENDOSCOPY;  Service: Gastroenterology;;   KNEE SURGERY Right    Torn meniscus   KNEE  SURGERY Left     Prior to Admission medications   Medication Sig Start Date End Date Taking? Authorizing Provider  acetaminophen (TYLENOL) 500 MG tablet Take 1,000 mg by mouth daily as needed for mild pain or moderate pain.    [provider]  acyclovir (ZOVIRAX) 200 MG capsule Take 2 capsules (400 mg total) by mouth 2 (two) times daily. 04/29/22   Ezekiel Slocumb, DO  ascorbic acid (VITAMIN C) 500 MG tablet Take 1 tablet (500 mg total) by mouth daily. 04/09/22   Lorella Nimrod, MD  aspirin EC 81 MG tablet Take 81 mg by mouth in the morning.    [provider]  atorvastatin (LIPITOR) 80 MG tablet Take 1 tablet (80 mg total) by mouth at bedtime. Hold while taking Paxlovid 04/08/22   Lorella Nimrod, MD  Baclofen 5 MG TABS Take 5 mg by mouth daily as needed (muscle spasm).    [provider]  clopidogrel (PLAVIX) 75 MG tablet Take 75 mg by mouth in the morning. 12/03/21   [provider]  docusate sodium (COLACE) 100 MG capsule Take 100 mg by mouth daily as needed for mild constipation.    [provider]  FARXIGA 10 MG TABS tablet Take 10 mg by mouth in the morning. 05/05/21   [provider]  feeding supplement, GLUCERNA SHAKE, (GLUCERNA SHAKE) LIQD Take 237 mLs by mouth 3 (three) times daily between meals. 04/29/22   Nicole Kindred A, DO  ferrous sulfate 325 (65 FE) MG tablet Take 1 tablet (325 mg total) by mouth daily with breakfast. 04/09/22   Lorella Nimrod, MD  fludrocortisone (FLORINEF) 0.1 MG tablet Take 2 tablets (0.2 mg total) by mouth daily. 05/29/22  Sreenath, Sudheer B, MD  guaiFENesin-dextromethorphan (ROBITUSSIN DM) 100-10 MG/5ML syrup Take 10 mLs by mouth every 4 (four) hours as needed for cough. 04/08/22   Lorella Nimrod, MD  HYDROcodone-acetaminophen (NORCO/VICODIN) 5-325 MG tablet Take 1 tablet by mouth every 6 (six) hours as needed for severe pain. 05/28/22   Sreenath, Sudheer B, MD  insulin degludec (TRESIBA FLEXTOUCH) 100 UNIT/ML  FlexTouch Pen Inject 15 Units into the skin daily. Patient taking differently: Inject 20 Units into the skin daily. 03/12/22   Wyvonnia Dusky, MD  insulin lispro (HUMALOG) 100 UNIT/ML injection Inject into the skin 3 (three) times daily before meals.    [provider]  midodrine (PROAMATINE) 5 MG tablet Take 3 tablets (15 mg total) by mouth 3 (three) times daily with meals. 05/28/22   Sidney Ace, MD  Multiple Vitamin (MULTIVITAMIN WITH MINERALS) TABS tablet Take 1 tablet by mouth daily. 04/09/22   Lorella Nimrod, MD  NOVOLOG 100 UNIT/ML injection Inject 0-9 Units into the skin 3 (three) times daily with meals. 03/12/22   Wyvonnia Dusky, MD  ondansetron (ZOFRAN) 8 MG tablet Take 8 mg by mouth daily as needed for nausea or vomiting.    [provider]  tamsulosin (FLOMAX) 0.4 MG CAPS capsule Take 0.4 mg by mouth daily.    [provider]  zinc sulfate 220 (50 Zn) MG capsule Take 1 capsule (220 mg total) by mouth daily. 04/09/22   Lorella Nimrod, MD    Current Facility-Administered Medications  Medication Dose Route Frequency Provider Last Rate Last Admin   [facility] Hold] 0.9 %  sodium chloride infusion (Manually program via Guardrails IV Fluids)   Intravenous Once Cherlynn June B, PA-C       0.9 %  sodium chloride infusion   Intravenous Continuous Ronnette Juniper, MD 20 mL/hr at 06/07/22 0816 New Bag at 06/07/22 0816   [MAR Hold] 0.9 %  sodium chloride infusion   Intravenous PRN Eulogio Bear U, DO 10 mL/hr at 06/07/22 0730 Infusion Verify at 06/07/22 0730   [MAR Hold] Chlorhexidine Gluconate Cloth 2 % PADS 6 each  6 each Topical Daily Geradine Girt, DO   6 each at 06/06/22 1517   [MAR Hold] dextrose 50 % solution 0-50 mL  0-50 mL Intravenous PRN Geradine Girt, DO       [MAR Hold] hydrALAZINE (APRESOLINE) injection 5 mg  5 mg Intravenous Q8H PRN Vann, Jessica U, DO       [MAR Hold] insulin aspart (novoLOG) injection 0-5 Units  0-5 Units Subcutaneous QHS  Raenette Rover, NP       [MAR Hold] insulin aspart (novoLOG) injection 0-9 Units  0-9 Units Subcutaneous TID WC Raenette Rover, NP       [MAR Hold] insulin glargine-yfgn (SEMGLEE) injection 5 Units  5 Units Subcutaneous Q24H Raenette Rover, NP   5 Units at 06/06/22 2311   [facility] Hold] Oral care mouth rinse  15 mL Mouth Rinse PRN Geradine Girt, DO       [MAR Hold] pantoprazole (PROTONIX) injection 40 mg  40 mg Intravenous Q12H Dorothyann Peng, PA-C       pantoprozole (PROTONIX) 80 mg /NS 100 mL infusion  8 mg/hr Intravenous Continuous Cherlynn June B, PA-C 10 mL/hr at 06/07/22 0730 8 mg/hr at 06/07/22 0730   [MAR Hold] sodium chloride flush (NS) 0.9 % injection 10-40 mL  10-40 mL Intracatheter Q12H Vann, Jessica U, DO   10 mL at 06/07/22 0808   [MAR Hold] sodium  chloride flush (NS) 0.9 % injection 10-40 mL  10-40 mL Intracatheter PRN Geradine Girt, DO       [MAR Hold] tamsulosin (FLOMAX) capsule 0.4 mg  0.4 mg Oral Daily Vann, Jessica U, DO        Allergies as of 06/06/2022 - Review Complete 06/06/2022  Allergen Reaction Noted   Penicillins Other (See Comments) 10/16/2014    Family History  Problem Relation Age of Onset   Heart attack Father    Hypertension Sister    Cancer Sister     Social History   Socioeconomic History   Marital status: Single    Spouse name: Not on file   Number of children: 3   Years of education: Not on file   Highest education level: High school graduate  Occupational History    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  Tobacco Use   Smoking status: Never   Smokeless tobacco: Never  Vaping Use   Vaping Use: Never used  Substance and Sexual Activity   Alcohol use: Not Currently    Alcohol/week: 0.0 - 1.0 standard drinks of alcohol    Comment: "once every 2 months"   Drug use: Yes    Types: Marijuana, "Crack" cocaine    Comment: last week   Sexual activity: Not Currently    Birth control/protection: None  Other Topics Concern   Not on file  Social  History Narrative   Lives with girlfriend "sharon"   Social Determinants of Health   Financial Resource Strain: Low Risk  (01/01/2020)   Overall Financial Resource Strain (CARDIA)    Difficulty of Paying Living Expenses: Not hard at all  Food Insecurity: No Food Insecurity (05/15/2022)   Hunger Vital Sign    Worried About Running Out of Food in the Last Year: Never true    Ran Out of Food in the Last Year: Never true  Transportation Needs: No Transportation Needs (05/15/2022)   PRAPARE - Hydrologist (Medical): No    Lack of Transportation (Non-Medical): No  Physical Activity: Insufficiently Active (01/01/2020)   Exercise Vital Sign    Days of Exercise per Week: 7 days    Minutes of Exercise per Session: 20 min  Stress: No Stress Concern Present (01/01/2020)   [place]    Feeling of Stress : Not at all  Social Connections: Moderately Isolated (01/01/2020)   Social Connection and Isolation Panel [NHANES]    Frequency of Communication with Friends and Family: More than three times a week    Frequency of Social Gatherings with Friends and Family: More than three times a week    Attends Religious Services: Never    Marine scientist or Organizations: No    Attends Archivist Meetings: Never    Marital Status: Living with partner  Intimate Partner Violence: Not At Risk (05/15/2022)   Humiliation, Afraid, Rape, and Kick questionnaire    Fear of Current or Ex-Partner: No    Emotionally Abused: No    Physically Abused: No    Sexually Abused: No    Review of Systems: As per HPI physical Exam: Vital signs in last 24 hours: Temp:  [97.7 F (36.5 C)-98.7 F (37.1 C)] 97.7 F (36.5 C) (03/10 0835) Pulse Rate:  [67-95] 67 (03/10 0835) Resp:  [4-21] 13 (03/10 0835) BP: (100-199)/(58-107) 184/104 (03/10 0835) SpO2:  [98 %-100 %] 100 % (03/10 0835) Weight:  [65.3 kg] 65.3 kg (03/10  0835) Last BM Date : 06/05/22  General:   Alert,  Well-developed, well-nourished, pleasant and cooperative in NAD Head:  Normocephalic and atraumatic. Eyes:  Sclera clear, no icterus.   Prominent pallor. Ears:  Normal auditory acuity. Nose:  No deformity, discharge,  or lesions. Mouth:  No deformity or lesions.  Oropharynx pink & moist. Neck:  Supple; no masses or thyromegaly. Lungs:  Clear throughout to auscultation.   No wheezes, crackles, or rhonchi. No acute distress. Heart:  Regular rate and rhythm; no murmurs, clicks, rubs,  or gallops. Extremities: Amputation of left great toe and left partial second toe Neurologic:  Alert and  oriented x4;  grossly normal neurologically. Skin:  Intact without significant lesions or rashes. Psych:  Alert and cooperative. Normal mood and affect. Abdomen:  Soft, nontender and nondistended. No masses, hepatosplenomegaly or hernias noted. Normal bowel sounds, without guarding, and without rebound.         Lab Results: Recent Labs    06/06/22 1004 06/06/22 2218 06/07/22 0016  WBC 4.3 4.2 4.1  HGB 6.3* 6.0* 6.1*  HCT 20.5* 18.6* 19.1*  PLT 228 173 179   BMET Recent Labs    06/06/22 1454 06/06/22 2017 06/07/22 0520  NA 134* 142 141  K 4.2 3.6 3.7  CL 104 108 110  CO2 '23 26 25  '$ GLUCOSE 421* 109* 112*  BUN 53* 51* 45*  CREATININE 1.65* 1.67* 1.35*  CALCIUM 7.8* 8.5* 8.3*   LFT No results for input(s): "PROT", "ALBUMIN", "AST", "ALT", "ALKPHOS", "BILITOT", "BILIDIR", "IBILI" in the last 72 hours. PT/INR No results for input(s): "LABPROT", "INR" in the last 72 hours.  Studies/Results: [place] EKG SITE RITE  Result Date: 06/06/2022 If Site Rite image not attached, placement could not be confirmed due to current cardiac rhythm.   Impression: Dark stools, anemia, history of aspirin and Plavix use, unclear when last dose was Low hemoglobin of 6.3 on admission, 6.1 today, has completed 3 units PRBC transfusion Elevated BUN/creatinine  ratio of 54/1.8 on admission and 45/1.35 today  History of H. pylori gastritis, gastric ulcers, unclear if he has previously been treated  Multiple comorbidities-uncontrolled diabetes, hypertension, dyslipidemia, history of encephalopathy, multiple myeloma, chronic kidney disease, stroke  Plan: Patient scheduled for EGD today, currently hemodynamically stable, has received Protonix 80 mg IV x 1 and remains on Protonix drip at 8 mg/h IV. The risks and the benefits of the procedure were discussed with the patient in details. He understands and verbalizes consent.   LOS: 1 day   Ronnette Juniper, MD  06/07/2022, 8:42 AM

## 2022-06-07 NOTE — Anesthesia Postprocedure Evaluation (Signed)
Anesthesia Post Note  Patient: Nicholas Weiss  Procedure(s) Performed: ESOPHAGOGASTRODUODENOSCOPY (EGD) WITH PROPOFOL     Patient location during evaluation: PACU Anesthesia Type: MAC Level of consciousness: awake and alert Pain management: pain level controlled Vital Signs Assessment: post-procedure vital signs reviewed and stable Respiratory status: spontaneous breathing, nonlabored ventilation, respiratory function stable and patient connected to nasal cannula oxygen Cardiovascular status: stable and blood pressure returned to baseline Postop Assessment: no apparent nausea or vomiting Anesthetic complications: no   No notable events documented.  Last Vitals:  Vitals:   06/07/22 0946 06/07/22 1000  BP: 109/78   Pulse: 76 78  Resp: (!) 8 (!) 8  Temp: 36.7 C   SpO2: 100% 100%    Last Pain:  Vitals:   06/07/22 0946  TempSrc: Oral  PainSc: 7                  Aarik Blank

## 2022-06-07 NOTE — Progress Notes (Signed)
Anesthesia assumed care at 0904.  Refer to anesthesia record for pt blood pressure post hydralazine.

## 2022-06-08 ENCOUNTER — Ambulatory Visit: Payer: 59 | Admitting: Nurse Practitioner

## 2022-06-08 ENCOUNTER — Ambulatory Visit: Payer: 59

## 2022-06-08 ENCOUNTER — Other Ambulatory Visit: Payer: 59

## 2022-06-08 DIAGNOSIS — E1010 Type 1 diabetes mellitus with ketoacidosis without coma: Secondary | ICD-10-CM | POA: Diagnosis not present

## 2022-06-08 DIAGNOSIS — C9000 Multiple myeloma not having achieved remission: Secondary | ICD-10-CM

## 2022-06-08 LAB — BASIC METABOLIC PANEL
Anion gap: 10 (ref 5–15)
BUN: 32 mg/dL — ABNORMAL HIGH (ref 8–23)
CO2: 22 mmol/L (ref 22–32)
Calcium: 8.1 mg/dL — ABNORMAL LOW (ref 8.9–10.3)
Chloride: 101 mmol/L (ref 98–111)
Creatinine, Ser: 1.05 mg/dL (ref 0.61–1.24)
GFR, Estimated: >60 mL/min (ref >60)
Glucose, Bld: 345 mg/dL — ABNORMAL HIGH (ref 70–99)
Potassium: 3.9 mmol/L (ref 3.5–5.1)
Sodium: 133 mmol/L — ABNORMAL LOW (ref 135–145)

## 2022-06-08 LAB — CBC
HCT: 27.5 % — ABNORMAL LOW (ref 39.0–52.0)
Hemoglobin: 8.9 g/dL — ABNORMAL LOW (ref 13.0–17.0)
MCH: 29.8 pg (ref 26.0–34.0)
MCHC: 32.4 g/dL (ref 30.0–36.0)
MCV: 92 fL (ref 80.0–100.0)
Platelets: 175 10*3/uL (ref 150–400)
RBC: 2.99 MIL/uL — ABNORMAL LOW (ref 4.22–5.81)
RDW: 18.2 % — ABNORMAL HIGH (ref 11.5–15.5)
WBC: 4.8 10*3/uL (ref 4.0–10.5)
nRBC: 0.4 % — ABNORMAL HIGH (ref 0.0–0.2)

## 2022-06-08 LAB — BPAM RBC
Blood Product Expiration Date: 202404042359
Blood Product Expiration Date: 202404042359
Blood Product Expiration Date: 202404062359
ISSUE DATE / TIME: 202403091753
ISSUE DATE / TIME: 202403100155
ISSUE DATE / TIME: 202403100555
Unit Type and Rh: 6200
Unit Type and Rh: 6200
Unit Type and Rh: 6200

## 2022-06-08 LAB — TYPE AND SCREEN
ABO/RH(D): A POS
Antibody Screen: NEGATIVE
Unit division: 0
Unit division: 0
Unit division: 0

## 2022-06-08 LAB — GLUCOSE, CAPILLARY
Glucose-Capillary: 111 mg/dL — ABNORMAL HIGH (ref 70–99)
Glucose-Capillary: 214 mg/dL — ABNORMAL HIGH (ref 70–99)
Glucose-Capillary: 261 mg/dL — ABNORMAL HIGH (ref 70–99)
Glucose-Capillary: 273 mg/dL — ABNORMAL HIGH (ref 70–99)
Glucose-Capillary: 313 mg/dL — ABNORMAL HIGH (ref 70–99)
Glucose-Capillary: 320 mg/dL — ABNORMAL HIGH (ref 70–99)
Glucose-Capillary: 70 mg/dL (ref 70–99)
Glucose-Capillary: 97 mg/dL (ref 70–99)

## 2022-06-08 MED ORDER — INSULIN GLARGINE-YFGN 100 UNIT/ML ~~LOC~~ SOLN
15.0000 [IU] | SUBCUTANEOUS | Status: DC
  Administered 2022-06-08: 15 [IU] via SUBCUTANEOUS
  Filled 2022-06-08: qty 0.15

## 2022-06-08 MED ORDER — INSULIN ASPART 100 UNIT/ML IJ SOLN
3.0000 [IU] | Freq: Three times a day (TID) | INTRAMUSCULAR | Status: DC
  Administered 2022-06-08 – 2022-06-10 (×3): 3 [IU] via SUBCUTANEOUS

## 2022-06-08 NOTE — TOC Initial Note (Addendum)
Transition of Care [facility]) - Initial/Assessment Note    Patient Details  Name: Nicholas Weiss MRN: [id number] Date of Birth: January 16, 1951  [facility] CM/SW Contact:    Illene Regulus, LCSW Phone Number: 06/08/2022, 11:11 AM  Clinical Narrative:                  Pt is from [facility] term rehab, pt can return to facility when stable , will need new auth. TOC to follow.    ADDEN 2:16pm CSW spoke with pt , he stated he would like a different facility. CSW explained the process, and informed pt he will need new insurance authorization. Pt will need updated PT note recommending Level of care.MD made aware. TOC to follow.     Patient Goals and CMS Choice            Expected Discharge Plan and Services                                              Prior Living Arrangements/Services                       Activities of Daily Living Home Assistive Devices/Equipment: Cane (specify quad or straight), Walker (specify type) ADL Screening (condition at time of admission) Patient's cognitive ability adequate to safely complete daily activities?: Yes Is the patient deaf or have difficulty hearing?: No Does the patient have difficulty seeing, even when wearing glasses/contacts?: Yes Does the patient have difficulty concentrating, remembering, or making decisions?: No Patient able to express need for assistance with ADLs?: Yes Does the patient have difficulty dressing or bathing?: Yes Independently performs ADLs?: No Does the patient have difficulty walking or climbing stairs?: Yes Weakness of Legs: Both Weakness of Arms/Hands: None  Permission Sought/Granted                  Emotional Assessment              Admission diagnosis:  GI bleed [K92.2] DKA (diabetic ketoacidosis) ([place]) [E11.10] Gastrointestinal hemorrhage, unspecified gastrointestinal hemorrhage type [K92.2] ABLA (acute blood loss anemia) [D62] Patient  Active Problem List   Diagnosis Date Noted   GI bleed 06/06/2022   Subdural hematoma ([place]) 05/15/2022   Falls 05/15/2022   Shoulder pain, left 05/15/2022   Hypophosphatemia 04/28/2022   Protein-calorie malnutrition, severe 04/24/2022   Diabetic ulcer of toe associated with diabetes mellitus due to underlying condition, with fat layer exposed ([place]) 04/24/2022   Streptococcal bacteremia 04/24/2022   Leukopenia 04/24/2022   History of multiple myeloma 04/24/2022   Sympathotonic orthostatic hypotension 04/24/2022   Weight loss 04/23/2022   Osteomyelitis of left foot ([place]) 04/22/2022   DKA, type 1 ([place]) 04/22/2022   Dehydration 04/08/2022   Diabetic ketoacidosis without coma associated with type 1 diabetes mellitus ([place]) 04/07/2022   Hyperkalemia 04/07/2022   DKA (diabetic ketoacidosis) ([place]) 04/07/2022   Traumatic pneumothorax 02/18/2022   Multiple fractures of ribs, left side, initial encounter for closed fracture 02/18/2022   Pneumothorax, closed, traumatic 02/18/2022   Benign prostatic hyperplasia with nocturia 02/12/2022   Stroke (cerebrum) ([place]) 11/07/2021   Multiple myeloma ([place]) 09/29/2021   Multiple myeloma not having achieved remission ([place]) 09/29/2021   Chronic hyponatremia 08/18/2021   COVID-19 virus infection 08/13/2021   History of CVA (cerebrovascular accident) 08/13/2021   Syncope and collapse  08/09/2021   Hepatic lesion 08/05/2021   Dyslipidemia 05/08/2021   History of substance abuse ([place]) 12/09/2020   Cocaine abuse ([place]) 11/30/2020   Muscle twitching 09/12/2020   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   Pre-syncope 08/01/2020   Heart block AV first degree 08/01/2020   Anemia of chronic kidney failure, stage 3 (moderate) ([place]) 05/28/2020   PUD (peptic ulcer disease)    Esophageal dysphagia    Encounter for screening colonoscopy    Generalized weakness 07/28/2019   Hypoglycemia 04/27/2019   Unresponsiveness 04/27/2019   Lung nodule 04/07/2019   Uncontrolled  type 1 diabetes mellitus    Acute renal failure superimposed on stage 3a chronic kidney disease ([place])    Hypertensive urgency 04/05/2019   CKD (chronic kidney disease) stage 3, GFR 30-59 ml/min ([place]) 04/05/2019   Hypercholesterolemia 01/24/2016   Essential (primary) hypertension 06/07/2006   Type 1 diabetes mellitus with diabetic neuropathy, unspecified ([place]) 05/31/2006   PCP:  Dion Body, MD Pharmacy:   [facility] [address] ([address]), [place]) [place] [address] Phone: [phone] Fax: [phone]  [facility] [place], [place] [address] Phone: [phone] Fax: [phone]  [place], [place] [address] Phone: [phone] Fax: [phone]  [name] [address] [place] [address] Phone: [phone] Fax: [phone]     Social Determinants of Health (SDOH) Social History: SDOH Screenings   Food Insecurity: No Food Insecurity (06/07/2022)  Housing: Low Risk  (06/07/2022)  Transportation Needs: No Transportation Needs (06/07/2022)  Utilities: Not At Risk (06/07/2022)  Alcohol Screen: Low Risk  (10/02/2020)  Depression (PHQ2-9): Medium Risk (10/02/2020)  Financial Resource Strain: Low Risk  (01/01/2020)  Physical Activity: Insufficiently Active (01/01/2020)  Social Connections: Moderately Isolated (01/01/2020)  Stress: No Stress Concern Present (01/01/2020)  Tobacco Use: Low Risk  (06/07/2022)   SDOH Interventions:     Readmission Risk Interventions    05/20/2022   11:41 AM 03/12/2022   10:22 AM 02/25/2022    2:43 PM  Readmission Risk Prevention Plan  Transportation Screening Complete Complete Complete  Medication Review Press photographer) Complete Complete Complete  PCP or Specialist appointment within 3-5  days of discharge Complete Complete Complete  HRI or Home Care Consult Complete Complete Complete  SW Recovery Care/Counseling Consult  Complete   Palliative Care Screening Complete Not Applicable Not [place] Complete Not Applicable Not Applicable

## 2022-06-08 NOTE — Inpatient Diabetes Management (Signed)
Inpatient Diabetes Program Recommendations  AACE/ADA: New Consensus Statement on Inpatient Glycemic Control (2015)  Target Ranges:  Prepandial:   less than 140 mg/dL      Peak postprandial:   less than 180 mg/dL (1-2 hours)      Critically ill patients:  140 - 180 mg/dL   Lab Results  Component Value Date   GLUCAP 313 (H) 06/08/2022   HGBA1C 9.1 (H) 05/20/2022    Review of Glycemic Control  Diabetes history: DM1 Outpatient Diabetes medications: Farxiga 10 QD, Tresiba 20 QD, Humalog s/s Current orders for Inpatient glycemic control: Novolog 0-9 TID and 0-5 HS, Semglee 10 units Q24H, Farxiga 10 QD  HgbA1C - 9.1% 320, 313 this am. Post-prandials elevated yesterday  Inpatient Diabetes Program Recommendations:    Increase Semglee to 15 units QHS  Add Novolog 3 units TID with meals if eating > 50%  Continue to follow.  Thank you. Lorenda Peck, RD, LDN, [place] Inpatient Diabetes Coordinator [phone]

## 2022-06-08 NOTE — Evaluation (Signed)
Physical Therapy Evaluation Patient Details Name: Nicholas Weiss MRN: [id number] DOB: 12/09/1950 Today's Date: 06/08/2022  History of Present Illness  Pt is 72 yo male admitted 06/06/22 with DKA (glucose >500) in type I DM.  Pt with recent hx of sepsis, orthostatic hypotension, osteomyelitis, SDH, L toe amputations (Digit 1, partial digit 2 on 04/25/22), recurrent falls (related to orthostatic hypotension/hypoglycemia), L humerus/greater tubercle fx (WBAT) with recent hospital admissions and most recently at [facility].  Pt with other hx including DM1, HTN, and HCL   Clinical Impression  Pt admitted with above diagnosis. At baseline, pt is independent without use of AD.  He has had several recent falls (although seem to be related to sepsis, orthostatic hypotension, hypoglycemia per chart review) and hospital admissions for sepsis, SDH, toe amputation.  Pt has been using RW since foot surgery.  Today, pt was able to ambulate 400' with min cues for safety.  He did not require physical assist and BP was stable throughout session with no syncopal symptoms.  Pt was admitted from SNF, but at this time pt seems to have made good progress with mobility and recommend HHPT.  Pt currently with functional limitations due to the deficits listed below (see PT Problem List). Pt will benefit from skilled PT to increase their independence and safety with mobility to allow discharge to the venue listed below.       BP P supine prior to session 121/73 BP sitting 131/75 BP standing 116/74 BP sitting post walk 120/70 BP again standing 134/78     Recommendations for follow up therapy are one component of a multi-disciplinary discharge planning process, led by the attending physician.  Recommendations may be updated based on patient status, additional functional criteria and insurance authorization.  Follow Up Recommendations Home health PT Can patient physically be transported by private vehicle: Yes    Assistance  Recommended at Discharge Intermittent Supervision/Assistance  Patient can return home with the following  Assistance with cooking/housework;Help with stairs or ramp for entrance    Equipment Recommendations None recommended by PT  Recommendations for Other Services       Functional Status Assessment Patient has had a recent decline in their functional status and demonstrates the ability to make significant improvements in function in a reasonable and predictable amount of time.     Precautions / Restrictions Precautions Precautions: Fall Restrictions LUE Weight Bearing: Weight bearing as tolerated LLE Weight Bearing: Weight bearing as tolerated (in CAM boot) Other Position/Activity Restrictions: Reviewed most recent podiatry note - WBAT in CAM      Mobility  Bed Mobility Overal bed mobility: Modified Independent Bed Mobility: Supine to Sit, Sit to Supine     Supine to sit: Modified independent (Device/Increase time) Sit to supine: Modified independent (Device/Increase time)        Transfers Overall transfer level: Needs assistance Equipment used: Rolling walker (2 wheels) Transfers: Sit to/from Stand Sit to Stand: Supervision           General transfer comment: Performed x 2; supervision for safety    Ambulation/Gait Ambulation/Gait assistance: Supervision Gait Distance (Feet): 400 Feet Assistive device: Rolling walker (2 wheels) Gait Pattern/deviations: Step-through pattern, Narrow base of support Gait velocity: decreased     General Gait Details: RW and supervision for safety; pt with occasional narrow BOS with R shoe hitting on CAM boot during swing phase but able to correct; no syncopal symptoms  Science writer  Modified Rankin (Stroke Patients Only)       Balance Overall balance assessment: Needs assistance Sitting-balance support: No upper extremity supported, Feet supported Sitting balance-Leahy Scale:  Normal Sitting balance - Comments: Pt able to don LLE cam boot sitting EOB without LOB   Standing balance support: Bilateral upper extremity supported, No upper extremity supported Standing balance-Leahy Scale: Fair Standing balance comment: RW to ambulate but no AD static stand                             Pertinent Vitals/Pain Pain Assessment Pain Assessment: No/denies pain    Home Living Family/patient expects to be discharged to:: Private residence Living Arrangements: Spouse/significant other Available Help at Discharge: Family;Available 24 hours/day Type of Home: House Home Access: Stairs to enter Entrance Stairs-Rails: Can reach both Entrance Stairs-Number of Steps: 1 STE bil railing   Home Layout: One level Home Equipment: Cane - single point;Tub bench;Grab bars - toilet;Grab bars - tub/shower;Rolling Walker (2 wheels)      Prior Function               Mobility Comments: Prior to foot surgery was able to ambulate without AD; Since sepsis from foot wound and then foot surgery using RW or cane.  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     Hand Dominance   Dominant Hand: Right    Extremity/Trunk Assessment   Upper Extremity Assessment Upper Extremity Assessment: Overall WFL for tasks assessed    Lower Extremity Assessment Lower Extremity Assessment: Overall WFL for tasks assessed (ROM WFL; MMT 5/5; has CAM Boot for L LE with mobility)    Cervical / Trunk Assessment Cervical / Trunk Assessment: Normal  Communication   Communication: No difficulties  Cognition Arousal/Alertness: Awake/alert Behavior During Therapy: WFL for tasks assessed/performed Overall Cognitive Status: Within Functional Limits for tasks assessed                                           General Comments Obtained blood pressures throughout session due to hx of orthostatic hypotension.  Pt had small drop with standing but asymptomatic.  He was on RA with sats 100%.  See below for BP.    BP supine prior to session 121/73 BP sitting 131/75 BP standing 116/74 BP sitting post walk 120/70 BP again standing 134/78  Educated pt on monitoring for orthostatic hypotension with slow transitions and exercises to promote blood flow prior to position changes.  Pt reports he is already moving legs prior to sitting and then moving arms/legs prior to standing.          Assessment/Plan    PT Assessment Patient needs continued PT services  PT Problem List Decreased activity tolerance;Decreased balance;Decreased mobility;Cardiopulmonary status limiting activity;Decreased safety awareness;Decreased knowledge of use of DME       PT Treatment Interventions DME instruction;Gait training;Stair training;Functional mobility training;Therapeutic activities;Therapeutic exercise;Balance training;Neuromuscular re-education;Cognitive remediation;Patient/family education    PT Goals (Current goals can be found in the Care Plan section)  Acute Rehab PT Goals Patient Stated Goal: hopeful to go home PT Goal Formulation: With patient Time For Goal Achievement: 06/22/22 Potential to Achieve Goals: Good    Frequency Min 3X/week  Co-evaluation               AM-PAC PT "6 Clicks" Mobility  Outcome Measure Help needed turning from your back to your side while in a flat bed without using bedrails?: None Help needed moving from lying on your back to sitting on the side of a flat bed without using bedrails?: A Little Help needed moving to and from a bed to a chair (including a wheelchair)?: A Little Help needed standing up from a chair using your arms (e.g., wheelchair or bedside chair)?: A Little Help needed to walk in hospital room?: A Little Help needed climbing 3-5 steps with a railing? :  A Little 6 Click Score: 19    End of Session Equipment Utilized During Treatment: Gait belt;Other (comment) (CAM boot) Activity Tolerance: Patient tolerated treatment well Patient left: in bed;with call bell/phone within reach;with bed alarm set ((4th floor, automatic alarms)) Nurse Communication: Mobility status PT Visit Diagnosis: Other abnormalities of gait and mobility (R26.89);Unsteadiness on feet (R26.81);Muscle weakness (generalized) (M62.81)    Time: [id number] PT Time Calculation (min) (ACUTE ONLY): 25 min   Charges:   PT Evaluation $PT Eval Low Complexity: 1 Low PT Treatments $Gait Training: 8-22 mins        Abran Richard, PT Acute Rehab [facility] [phone]   Karlton Lemon 06/08/2022, 3:45 PM

## 2022-06-08 NOTE — Progress Notes (Signed)
PROGRESS NOTE    Nicholas Weiss  [id number] DOB: June 27, 1950 DOA: 06/06/2022 PCP: Dion Body, MD    Brief Narrative:  Nicholas Weiss is a 72 y.o. male with medical history significant of type 1 diabetes, hypertension, recent amputation of his left toes.  Patient recently at [facility] with falls due to orthostatic hypotension, hypertensive urgency, osteomyelitis with partial second toe amputation, and subdural hematoma.  He was discharged from [facility] to [facility].  This a.m. at [facility] he was noted to have a glucose reading of greater than 500.  He was sent in for further evaluation.  In the ER, he was found to have a low hemoglobin and was found to have melanotic stools.  ER PA spoke with GI who plans to scope patient in the morning.  In the ER patient also was found to have hyperglycemia with anion gap of 16 and a beta hydroxybutyrate acid of greater than 3.  -back to SNF in AM   Assessment and Plan: DKA in a type 1 DM (once both insulin and oral meds at home?) -resolved -start SSI and long acting adjust as needed  Hypokalemia -repleted   GI bleed -EGD: Protonix (pantoprazole) 40 mg PO daily for 2 months.                           - Ok to resume ASA and plavix as needed.                           - Rectal exam showed formed dark green stools, NOT                            melanotic, anemia could be related to history of                            Multiple Myeloma. -s/p 3 units PRBC- trend h/h   H/o substance abuse - in a facility so doubt actively using   AKI -resolved   H/o orthostatic hypotension -resolved -d/c midodrine and florinef   Multiple myeloma -outpatient follow up  DVT prophylaxis: SCDs Start: 06/06/22 1213    Code Status: Full Code   Disposition Plan:  Level of care: Telemetry Status is: Inpatient Remains inpatient appropriate because: needs return to SNF- TOC consulted-- back to SNF in AM if hgb stable    Consultants:   GI   Subjective: No overnight events  Objective: Vitals:   06/07/22 1539 06/07/22 2024 06/08/22 0000 06/08/22 0500  BP: (!) 153/91 136/78 (!) 167/96 (!) 147/89  Pulse: 78     Resp: '18 14 12 20  '$ Temp: 99.7 F (37.6 C) 98.6 F (37 C) 98.7 F (37.1 C) 98.7 F (37.1 C)  TempSrc: Oral Oral Oral Oral  SpO2: 98% 100% 100% 100%  Weight:      Height:        Intake/Output Summary (Last 24 hours) at 06/08/2022 1039 Last data filed at 06/08/2022 1037 Gross per 24 hour  Intake 1169.83 ml  Output 2250 ml  Net -1080.17 ml   Filed Weights   06/06/22 0939 06/07/22 0835  Weight: 65.3 kg 65.3 kg    Examination:    General: Appearance:    Well developed, well nourished male in no acute distress     Lungs:     respirations  unlabored  Heart:    Normal heart rate. Normal rhythm. No murmurs, rubs, or gallops.      Neurologic:   Awake, alert         Data Reviewed: I have personally reviewed following labs and imaging studies  CBC: Recent Labs  Lab 06/06/22 1004 06/06/22 2218 06/07/22 0016 06/07/22 1000 06/08/22 0243  WBC 4.3 4.2 4.1 7.1 4.8  NEUTROABS 2.7  --   --   --   --   HGB 6.3* 6.0* 6.1* 9.9* 8.9*  HCT 20.5* 18.6* 19.1* 30.0* 27.5*  MCV 99.5 94.9 94.1 90.6 92.0  PLT 228 173 179 196 0000000   Basic Metabolic Panel: Recent Labs  Lab 06/06/22 1004 06/06/22 1454 06/06/22 2017 06/07/22 0520 06/08/22 0243  NA 137 134* 142 141 133*  K 3.1* 4.2 3.6 3.7 3.9  CL 103 104 108 110 101  CO2 18* '23 26 25 22  '$ GLUCOSE 344* 421* 109* 112* 345*  BUN 54* 53* 51* 45* 32*  CREATININE 1.80* 1.65* 1.67* 1.35* 1.05  CALCIUM 8.6* 7.8* 8.5* 8.3* 8.1*   GFR: Estimated Creatinine Clearance: 61.3 mL/min (by C-G formula based on SCr of 1.05 mg/dL). Liver Function Tests: No results for input(s): "AST", "ALT", "ALKPHOS", "BILITOT", "PROT", "ALBUMIN" in the last 168 hours. No results for input(s): "LIPASE", "AMYLASE" in the last 168 hours. No results for input(s): "AMMONIA" in the  last 168 hours. Coagulation Profile: No results for input(s): "INR", "PROTIME" in the last 168 hours. Cardiac Enzymes: No results for input(s): "CKTOTAL", "CKMB", "CKMBINDEX", "TROPONINI" in the last 168 hours. BNP (last 3 results) No results for input(s): "PROBNP" in the last 8760 hours. HbA1C: No results for input(s): "HGBA1C" in the last 72 hours. CBG: Recent Labs  Lab 06/07/22 1652 06/07/22 2023 06/08/22 0005 06/08/22 0505 06/08/22 0753  GLUCAP 232* 149* 273* 320* 313*   Lipid Profile: No results for input(s): "CHOL", "HDL", "LDLCALC", "TRIG", "CHOLHDL", "LDLDIRECT" in the last 72 hours. Thyroid Function Tests: No results for input(s): "TSH", "T4TOTAL", "FREET4", "T3FREE", "THYROIDAB" in the last 72 hours. Anemia Panel: No results for input(s): "VITAMINB12", "FOLATE", "FERRITIN", "TIBC", "IRON", "RETICCTPCT" in the last 72 hours. Sepsis Labs: No results for input(s): "PROCALCITON", "LATICACIDVEN" in the last 168 hours.  Recent Results (from the past 240 hour(s))  MRSA Next Gen by PCR, Nasal     Status: None   Collection Time: 06/06/22  2:50 PM   Specimen: Nasal Mucosa; Nasal Swab  Result Value Ref Range Status   MRSA by PCR Next Gen NOT DETECTED NOT DETECTED Final    Comment: (NOTE) The GeneXpert MRSA Assay (FDA approved for NASAL specimens only), is one component of a comprehensive MRSA colonization surveillance program. It is not intended to diagnose MRSA infection nor to guide or monitor treatment for MRSA infections. Test performance is not FDA approved in patients less than 16 years old. Performed at [facility], [place] [address] [place], [place] [address]          Radiology Studies: [place] EKG SITE RITE  Result Date: 06/06/2022 If Site Rite image not attached, placement could not be confirmed due to current cardiac rhythm.       Scheduled Meds:  sodium chloride   Intravenous Once   ascorbic acid  500 mg Oral Daily   aspirin EC  81  mg Oral q AM   atorvastatin  80 mg Oral QHS   Chlorhexidine Gluconate Cloth  6 each Topical Daily   clopidogrel  75 mg  Oral q AM   dapagliflozin propanediol  10 mg Oral q AM   feeding supplement (GLUCERNA SHAKE)  237 mL Oral TID BM   ferrous sulfate  325 mg Oral Q breakfast   insulin aspart  0-5 Units Subcutaneous QHS   insulin aspart  0-9 Units Subcutaneous TID WC   insulin aspart  3 Units Subcutaneous TID WC   insulin glargine-yfgn  15 Units Subcutaneous Q24H   multivitamin with minerals  1 tablet Oral Daily   pantoprazole  40 mg Intravenous Q24H   sodium chloride flush  10-40 mL Intracatheter Q12H   tamsulosin  0.4 mg Oral Daily   zinc sulfate  220 mg Oral Daily   Continuous Infusions:  sodium chloride 10 mL/hr at 06/08/22 0300     LOS: 2 days    Time spent: 35 minutes spent on chart review, discussion with nursing staff, consultants, updating family and interview/physical exam; more than 50% of that time was spent in counseling and/or coordination of care.    Geradine Girt, DO [facility] Available via Epic secure chat 7am-7pm After these hours, please refer to coverage provider listed on [email or web address] 06/08/2022, 10:39 AM

## 2022-06-08 NOTE — Telephone Encounter (Signed)
Okay to remove sutures.  Please notify Thedore Mins.  Thanks for Dr. Amalia Hailey

## 2022-06-08 NOTE — Plan of Care (Signed)
  Problem: Activity: Goal: Ability to tolerate increased activity will improve Outcome: Progressing   Problem: Health Behavior/Discharge Planning: Goal: Ability to safely manage health-related needs after discharge will improve Outcome: Progressing   Problem: Fluid Volume: Goal: Ability to maintain a balanced intake and output will improve Outcome: Progressing   Problem: Metabolic: Goal: Ability to maintain appropriate glucose levels will improve Outcome: Progressing   Problem: Nutritional: Goal: Maintenance of adequate nutrition will improve Outcome: Progressing

## 2022-06-08 NOTE — TOC Progression Note (Signed)
Transition of Care [facility]) - Progression Note    Patient Details  Name: Nicholas Weiss MRN: [id number] Date of Birth: 11-25-1950  Transition of Care [facility]) CM/SW [place], LCSW Phone Number: 06/08/2022, 4:30 PM  Clinical Narrative:    CSW spoke with pt about recommendations for Home Health, pt has agreed to [facility] services. Pt reported he had [facility] before he came to hospital. CSW spoke with Claiborne Billings from [facility] [place] pt is active for home health services. Pt will need new orders. TOC will continue to follow for d/c needs         Expected Discharge Plan and Services                                               Social Determinants of Health (SDOH) Interventions SDOH Screenings   Food Insecurity: No Food Insecurity (06/07/2022)  Housing: Low Risk  (06/07/2022)  Transportation Needs: No Transportation Needs (06/07/2022)  Utilities: Not At Risk (06/07/2022)  Alcohol Screen: Low Risk  (10/02/2020)  Depression (PHQ2-9): Medium Risk (10/02/2020)  Financial Resource Strain: Low Risk  (01/01/2020)  Physical Activity: Insufficiently Active (01/01/2020)  Social Connections: Moderately Isolated (01/01/2020)  Stress: No Stress Concern Present (01/01/2020)  Tobacco Use: Low Risk  (06/07/2022)    Readmission Risk Interventions    05/20/2022   11:41 AM 03/12/2022   10:22 AM 02/25/2022    2:43 PM  Readmission Risk Prevention Plan  Transportation Screening Complete Complete Complete  Medication Review Press photographer) Complete Complete Complete  PCP or Specialist appointment within 3-5 days of discharge Complete Complete Complete  HRI or Home Care Consult Complete Complete Complete  SW Recovery Care/Counseling Consult  Complete   Palliative Care Screening Complete Not Applicable Not [place] Complete Not Applicable Not Applicable

## 2022-06-09 ENCOUNTER — Encounter (HOSPITAL_COMMUNITY): Payer: Self-pay | Admitting: Gastroenterology

## 2022-06-09 ENCOUNTER — Inpatient Hospital Stay (HOSPITAL_COMMUNITY): Payer: 59

## 2022-06-09 DIAGNOSIS — D62 Acute posthemorrhagic anemia: Secondary | ICD-10-CM | POA: Diagnosis not present

## 2022-06-09 DIAGNOSIS — K922 Gastrointestinal hemorrhage, unspecified: Secondary | ICD-10-CM | POA: Diagnosis not present

## 2022-06-09 LAB — CBC
HCT: 24 % — ABNORMAL LOW (ref 39.0–52.0)
Hemoglobin: 7.9 g/dL — ABNORMAL LOW (ref 13.0–17.0)
MCH: 30.3 pg (ref 26.0–34.0)
MCHC: 32.9 g/dL (ref 30.0–36.0)
MCV: 92 fL (ref 80.0–100.0)
Platelets: 162 10*3/uL (ref 150–400)
RBC: 2.61 MIL/uL — ABNORMAL LOW (ref 4.22–5.81)
RDW: 17.4 % — ABNORMAL HIGH (ref 11.5–15.5)
WBC: 3.5 10*3/uL — ABNORMAL LOW (ref 4.0–10.5)
nRBC: 0 % (ref 0.0–0.2)

## 2022-06-09 LAB — H. PYLORI ANTIGEN, STOOL: H. Pylori Stool Ag, Eia: NEGATIVE

## 2022-06-09 LAB — BASIC METABOLIC PANEL
Anion gap: 6 (ref 5–15)
BUN: 34 mg/dL — ABNORMAL HIGH (ref 8–23)
CO2: 28 mmol/L (ref 22–32)
Calcium: 8.2 mg/dL — ABNORMAL LOW (ref 8.9–10.3)
Chloride: 101 mmol/L (ref 98–111)
Creatinine, Ser: 1.13 mg/dL (ref 0.61–1.24)
GFR, Estimated: >60 mL/min (ref >60)
Glucose, Bld: 96 mg/dL (ref 70–99)
Potassium: 3.9 mmol/L (ref 3.5–5.1)
Sodium: 135 mmol/L (ref 135–145)

## 2022-06-09 LAB — GLUCOSE, CAPILLARY
Glucose-Capillary: 63 mg/dL — ABNORMAL LOW (ref 70–99)
Glucose-Capillary: 96 mg/dL (ref 70–99)
Glucose-Capillary: 141 mg/dL — ABNORMAL HIGH (ref 70–99)
Glucose-Capillary: 59 mg/dL — ABNORMAL LOW (ref 70–99)
Glucose-Capillary: 69 mg/dL — ABNORMAL LOW (ref 70–99)
Glucose-Capillary: 155 mg/dL — ABNORMAL HIGH (ref 70–99)
Glucose-Capillary: 260 mg/dL — ABNORMAL HIGH (ref 70–99)

## 2022-06-09 MED ORDER — INSULIN GLARGINE-YFGN 100 UNIT/ML ~~LOC~~ SOLN
13.0000 [IU] | SUBCUTANEOUS | Status: DC
  Administered 2022-06-09 – 2022-06-11 (×3): 13 [IU] via SUBCUTANEOUS
  Filled 2022-06-09 (×4): qty 0.13

## 2022-06-09 NOTE — Progress Notes (Addendum)
PROGRESS NOTE    Nicholas Weiss  [id number] DOB: [birth year] DOA: 06/06/2022 PCP: Dion Body, MD    Brief Narrative:  Nicholas Weiss is a 72 y.o. male with medical history significant of type 1 diabetes, hypertension, recent amputation of his left toes.  Patient recently at [facility] with falls due to orthostatic hypotension, hypertensive urgency, osteomyelitis with partial second toe amputation, and subdural hematoma.  He was discharged from [facility] to [facility].  This a.m. at [facility] he was noted to have a glucose reading of greater than 500.  He was sent in for further evaluation.  In the ER, he was found to have a low hemoglobin and was found to have melanotic stools.  ER PA spoke with GI who plans to scope patient in the morning.  In the ER patient also was found to have hyperglycemia with anion gap of 16 and a beta hydroxybutyrate acid of greater than 3.  From SNF but has progressed to being able to go home with home health.  ? D/c in AM if hgb stable   Assessment and Plan: DKA in a type 1 DM (once both insulin and oral meds at home?) -resolved -start SSI and long acting adjust as needed for better control  Hypokalemia -repleted   GI bleed -EGD: Protonix (pantoprazole) 40 mg PO daily for 2 months.                           - Ok to resume ASA and plavix as needed.                           - Rectal exam showed formed dark green stools, NOT                            melanotic, anemia could be related to history of                            Multiple Myeloma. -s/p 3 units PRBC- trend h/h -h/h dropped 1 gram overnight-- no bleeding seen, no BM-- recheck in AM   H/o substance abuse - in a facility so doubt actively using   AKI -resolved   H/o orthostatic hypotension -resolved -d/c midodrine and florinef   Multiple myeloma -outpatient follow up  Discoloration of 2nd and 3rd toe -check x ray -warm/dry-- no sign of infection -follows with Dr. Amalia Hailey-- will  need close follow up   DVT prophylaxis: SCDs Start: 06/06/22 1213    Code Status: Full Code   Disposition Plan:  Level of care: Telemetry Status is: Inpatient Remains inpatient appropriate because: home once hgb stable    Consultants:  GI   Subjective: No BM overnight No blood or other bleeding  Objective: Vitals:   06/08/22 0500 06/08/22 1302 06/08/22 1957 06/09/22 0444  BP: (!) 147/89 121/73 (!) 153/90 (!) 151/80  Pulse:  81 78 75  Resp: 20 18    Temp: 98.7 F (37.1 C) 99 F (37.2 C) 98.9 F (37.2 C) 98.2 F (36.8 C)  TempSrc: Oral Oral Oral Oral  SpO2: 100% 99% 100% 100%  Weight:      Height:        Intake/Output Summary (Last 24 hours) at 06/09/2022 1221 Last data filed at 06/09/2022 0902 Gross per 24 hour  Intake 950 ml  Output  1720 ml  Net -770 ml   Filed Weights   06/06/22 0939 06/07/22 0835  Weight: 65.3 kg 65.3 kg    Examination:    General: Appearance:    Well developed, well nourished male in no acute distress     Lungs:     respirations unlabored  Heart:    Normal heart rate. Normal rhythm. No murmurs, rubs, or gallops.      Neurologic:   Awake, alert         Data Reviewed: I have personally reviewed following labs and imaging studies  CBC: Recent Labs  Lab 06/06/22 1004 06/06/22 2218 06/07/22 0016 06/07/22 1000 06/08/22 0243 06/09/22 0417  WBC 4.3 4.2 4.1 7.1 4.8 3.5*  NEUTROABS 2.7  --   --   --   --   --   HGB 6.3* 6.0* 6.1* 9.9* 8.9* 7.9*  HCT 20.5* 18.6* 19.1* 30.0* 27.5* 24.0*  MCV 99.5 94.9 94.1 90.6 92.0 92.0  PLT 228 173 179 196 175 0000000   Basic Metabolic Panel: Recent Labs  Lab 06/06/22 1454 06/06/22 2017 06/07/22 0520 06/08/22 0243 06/09/22 0417  NA 134* 142 141 133* 135  K 4.2 3.6 3.7 3.9 3.9  CL 104 108 110 101 101  CO2 '23 26 25 22 28  '$ GLUCOSE 421* 109* 112* 345* 96  BUN 53* 51* 45* 32* 34*  CREATININE 1.65* 1.67* 1.35* 1.05 1.13  CALCIUM 7.8* 8.5* 8.3* 8.1* 8.2*   GFR: Estimated Creatinine  Clearance: 57 mL/min (by C-G formula based on SCr of 1.13 mg/dL). Liver Function Tests: No results for input(s): "AST", "ALT", "ALKPHOS", "BILITOT", "PROT", "ALBUMIN" in the last 168 hours. No results for input(s): "LIPASE", "AMYLASE" in the last 168 hours. No results for input(s): "AMMONIA" in the last 168 hours. Coagulation Profile: No results for input(s): "INR", "PROTIME" in the last 168 hours. Cardiac Enzymes: No results for input(s): "CKTOTAL", "CKMB", "CKMBINDEX", "TROPONINI" in the last 168 hours. BNP (last 3 results) No results for input(s): "PROBNP" in the last 8760 hours. HbA1C: No results for input(s): "HGBA1C" in the last 72 hours. CBG: Recent Labs  Lab 06/08/22 1612 06/08/22 2047 06/09/22 0732 06/09/22 0755 06/09/22 1139  GLUCAP 70 261* 63* 96 141*   Lipid Profile: No results for input(s): "CHOL", "HDL", "LDLCALC", "TRIG", "CHOLHDL", "LDLDIRECT" in the last 72 hours. Thyroid Function Tests: No results for input(s): "TSH", "T4TOTAL", "FREET4", "T3FREE", "THYROIDAB" in the last 72 hours. Anemia Panel: No results for input(s): "VITAMINB12", "FOLATE", "FERRITIN", "TIBC", "IRON", "RETICCTPCT" in the last 72 hours. Sepsis Labs: No results for input(s): "PROCALCITON", "LATICACIDVEN" in the last 168 hours.  Recent Results (from the past 240 hour(s))  MRSA Next Gen by PCR, Nasal     Status: None   Collection Time: 06/06/22  2:50 PM   Specimen: Nasal Mucosa; Nasal Swab  Result Value Ref Range Status   MRSA by PCR Next Gen NOT DETECTED NOT DETECTED Final    Comment: (NOTE) The GeneXpert MRSA Assay (FDA approved for NASAL specimens only), is one component of a comprehensive MRSA colonization surveillance program. It is not intended to diagnose MRSA infection nor to guide or monitor treatment for MRSA infections. Test performance is not FDA approved in patients less than 67 years old. Performed at [facility], [place] [address], [place], [place]  [address]          Radiology Studies: No results found.      Scheduled Meds:  sodium chloride   Intravenous Once  ascorbic acid  500 mg Oral Daily   aspirin EC  81 mg Oral q AM   atorvastatin  80 mg Oral QHS   Chlorhexidine Gluconate Cloth  6 each Topical Daily   clopidogrel  75 mg Oral q AM   dapagliflozin propanediol  10 mg Oral q AM   feeding supplement (GLUCERNA SHAKE)  237 mL Oral TID BM   ferrous sulfate  325 mg Oral Q breakfast   insulin aspart  0-5 Units Subcutaneous QHS   insulin aspart  0-9 Units Subcutaneous TID WC   insulin aspart  3 Units Subcutaneous TID WC   insulin glargine-yfgn  13 Units Subcutaneous Q24H   multivitamin with minerals  1 tablet Oral Daily   pantoprazole  40 mg Intravenous Q24H   sodium chloride flush  10-40 mL Intracatheter Q12H   tamsulosin  0.4 mg Oral Daily   zinc sulfate  220 mg Oral Daily   Continuous Infusions:  sodium chloride 10 mL/hr at 06/08/22 0300     LOS: 3 days    Time spent: 35 minutes spent on chart review, discussion with nursing staff, consultants, updating family and interview/physical exam; more than 50% of that time was spent in counseling and/or coordination of care.    Geradine Girt, DO [facility] Available via Epic secure chat 7am-7pm After these hours, please refer to coverage provider listed on [email or web address] 06/09/2022, 12:21 PM

## 2022-06-09 NOTE — Evaluation (Signed)
Occupational Therapy Evaluation Patient Details Name: Nicholas Weiss MRN: [id number] DOB: 23-Apr-1950 Today's Date: 06/09/2022   History of Present Illness Patient is 72 year old male who was admitted 06/06/22 with DKA (glucose >500) in type I DM.  Pt with recent hx of sepsis, orthostatic hypotension, osteomyelitis, SDH, L toe amputations (Digit 1, partial digit 2 on 04/25/22), recurrent falls (related to orthostatic hypotension/hypoglycemia), L humerus/greater tubercle fx (WBAT) with recent hospital admissions and most recently at [facility].  Pt with other hx including DM1, HTN, and HCL   Clinical Impression   Patient is a 72 year old male who was admitted for above. Patient was living at home with his "old lady" independently prior level. Currently, patient is supervision for standing ADL tasks in room with patient noted to have poor safety with turning with RW with education provided to keep RW on the ground multiple times during session. Patient expressed that he wanted to transition home and would have family support available. Patient to continue skilled OT services to work on standing balance and safety with RW.      Recommendations for follow up therapy are one component of a multi-disciplinary discharge planning process, led by the attending physician.  Recommendations may be updated based on patient status, additional functional criteria and insurance authorization.   Follow Up Recommendations  Home health OT     Assistance Recommended at Discharge Frequent or constant Supervision/Assistance  Patient can return home with the following A little help with walking and/or transfers;Assistance with cooking/housework;Direct supervision/assist for medications management;Assist for transportation;Help with stairs or ramp for entrance;Direct supervision/assist for financial management    Functional Status Assessment  Patient has had a recent decline in their functional status and demonstrates the  ability to make significant improvements in function in a reasonable and predictable amount of time.  Equipment Recommendations  None recommended by OT    Recommendations for Other Services       Precautions / Restrictions Precautions Precautions: Fall Restrictions LLE Weight Bearing: Weight bearing as tolerated (in CAM boot) Other Position/Activity Restrictions: Reviewed most recent podiatry note - WBAT in CAM      Mobility Bed Mobility Overal bed mobility: Modified Independent        Transfers Overall transfer level: Needs assistance Equipment used: Rolling walker (2 wheels) Transfers: Sit to/from Stand Sit to Stand: Supervision     General transfer comment: supervision needed for cues to keep RW on ground during turns.      Balance Overall balance assessment: Needs assistance Sitting-balance support: No upper extremity supported, Feet supported Sitting balance-Leahy Scale: Normal     Standing balance support: No upper extremity supported, During functional activity Standing balance-Leahy Scale: Fair         ADL either performed or assessed with clinical judgement   ADL Overall ADL's : Needs assistance/impaired Eating/Feeding: Modified independent Eating/Feeding Details (indicate cue type and reason): sitting on bench in room Grooming: Wash/dry hands;Wash/dry face;Oral care;Supervision/safety;Standing Grooming Details (indicate cue type and reason): with cues for safety with RW otherwise MI for task in static standing Upper Body Bathing: Modified independent;Sitting   Lower Body Bathing: Modified independent;Sitting/lateral leans   Upper Body Dressing : Modified independent;Sitting   Lower Body Dressing: Supervision/safety;Sit to/from stand   Toilet Transfer: Copy Details (indicate cue type and reason): cues to keep RW on the ground and not pick up during turns. Toileting- Clothing Manipulation and Hygiene:  Supervision/safety;Sit to/from stand Toileting - Clothing Manipulation Details (indicate cue type and reason):  simulated reaching tasks.     Functional mobility during ADLs: Supervision/safety;Rolling walker (2 wheels)        Pertinent Vitals/Pain Pain Assessment Pain Assessment: No/denies pain     Hand Dominance Right   Extremity/Trunk Assessment Upper Extremity Assessment Upper Extremity Assessment: Overall WFL for tasks assessed LUE Deficits / Details: noted to have sling during previous admission but WBAT LUE note from last OT session   Lower Extremity Assessment Lower Extremity Assessment: Defer to PT evaluation   Cervical / Trunk Assessment Cervical / Trunk Assessment: Normal   Communication Communication Communication: No difficulties   Cognition Arousal/Alertness: Awake/alert Behavior During Therapy: WFL for tasks assessed/performed Overall Cognitive Status: Within Functional Limits for tasks assessed       General Comments: patient was noted to have moments of irritation during session but over all appropirate.     General Comments    Patients O2 noted to drop to 88% on RA with grooming tasks standing at sink. Patient was able to bring back up within a few breaths to 92% on RA.            Home Living Family/patient expects to be discharged to:: Private residence Living Arrangements: Spouse/significant other Available Help at Discharge: Family;Available 24 hours/day Type of Home: House Home Access: Stairs to enter [employer] of Steps: 1 STE bil railing Entrance Stairs-Rails: Can reach both Home Layout: One level     Bathroom Shower/Tub: Teacher, early years/pre: Standard     Home Equipment: Cane - single point;Tub bench;Grab bars - toilet;Grab bars - tub/shower;Rolling Walker (2 wheels)          Prior Functioning/Environment Prior Level of Function : Independent/Modified Independent             Mobility Comments:  Prior to foot surgery was able to ambulate without AD; Since sepsis from foot wound and then foot surgery using RW or cane.  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        OT Problem List: Decreased strength;Decreased range of motion;Decreased activity tolerance;Impaired balance (sitting and/or standing);Decreased knowledge of use of DME or AE;Decreased safety awareness      OT Treatment/Interventions: Self-care/ADL training;Therapeutic exercise;Therapeutic activities;DME and/or AE instruction    OT Goals(Current goals can be found in the care plan section) Acute Rehab OT Goals Patient Stated Goal: to go home today OT Goal Formulation: With patient Time For Goal Achievement: 06/23/22 Potential to Achieve Goals: Fair  OT Frequency: Min 2X/week       AM-PAC OT "6 Clicks" Daily Activity     Outcome Measure Help from another person eating meals?: None Help from another person taking care of personal grooming?: None Help from another person toileting, which includes using toliet, bedpan, or urinal?: A Little Help from another person bathing (including washing, rinsing, drying)?: A Little Help from another person to put on and taking off regular upper body clothing?: None Help from another person to put on and taking off regular lower body clothing?: A Little 6 Click Score: 21   End of Session Equipment Utilized During Treatment: Other (comment);Rolling walker (2 wheels) (CAM boot L foot) Nurse Communication: Mobility status  Activity Tolerance: Patient tolerated treatment well Patient left: in chair;with call bell/phone within reach  OT Visit Diagnosis: Unsteadiness on feet (R26.81);Muscle weakness (generalized) (M62.81);Repeated falls (R29.6)  Time: [id number] OT Time Calculation (min): 11  min Charges:  OT General Charges $OT Visit: 1 Visit OT Evaluation $OT Eval Low Complexity: 1 Low  Geovanny Sartin OTR/L, MS Acute Rehabilitation Department Office# [phone]   Willa Rough 06/09/2022, 9:26 AM

## 2022-06-09 NOTE — Inpatient Diabetes Management (Signed)
Inpatient Diabetes Program Recommendations  AACE/ADA: New Consensus Statement on Inpatient Glycemic Control (2015)  Target Ranges:  Prepandial:   less than 140 mg/dL      Peak postprandial:   less than 180 mg/dL (1-2 hours)      Critically ill patients:  140 - 180 mg/dL   Lab Results  Component Value Date   GLUCAP 141 (H) 06/09/2022   HGBA1C 9.1 (H) 05/20/2022    Review of Glycemic Control  Diabetes history: DM1 Outpatient Diabetes medications: Farxiga 10 QD, Tresiba 15 QD, Novolog s/s (2-4 units) TID Current orders for Inpatient glycemic control: Semglee 13 units Q24H, Novolog 0-9 units TID with meals and 0-5 HS + 3 units TID  HgbA1C - 9.1% Verified home meds with pt Eating 100% Endo - Solum - last visit 03/16/22  Inpatient Diabetes Program Recommendations:    Agree with orders.  Spoke with pt at bedside regarding his diabetes and glucose control. States he always takes his [name], but doesn't take Novolog on regular basis because sometimes he doesn't need it. Michela Pitcher he is working on getting HgbA1C down < 8%. Explained that he will get meal coverage insulin if he eats most of his food tray. Pt states he understands.  Continue to follow.  Thank you. Lorenda Peck, RD, LDN, [place] Inpatient Diabetes Coordinator [phone]

## 2022-06-10 DIAGNOSIS — I959 Hypotension, unspecified: Secondary | ICD-10-CM

## 2022-06-10 DIAGNOSIS — I951 Orthostatic hypotension: Secondary | ICD-10-CM

## 2022-06-10 DIAGNOSIS — N179 Acute kidney failure, unspecified: Secondary | ICD-10-CM

## 2022-06-10 DIAGNOSIS — E440 Moderate protein-calorie malnutrition: Secondary | ICD-10-CM | POA: Insufficient documentation

## 2022-06-10 LAB — CBC WITH DIFFERENTIAL/PLATELET
Abs Immature Granulocytes: 0.01 10*3/uL (ref 0.00–0.07)
Basophils Absolute: 0 10*3/uL (ref 0.0–0.1)
Basophils Relative: 0 %
Eosinophils Absolute: 0.1 10*3/uL (ref 0.0–0.5)
Eosinophils Relative: 2 %
HCT: 25.2 % — ABNORMAL LOW (ref 39.0–52.0)
Hemoglobin: 8.2 g/dL — ABNORMAL LOW (ref 13.0–17.0)
Immature Granulocytes: 0 %
Lymphocytes Relative: 25 %
Lymphs Abs: 1.1 10*3/uL (ref 0.7–4.0)
MCH: 30.4 pg (ref 26.0–34.0)
MCHC: 32.5 g/dL (ref 30.0–36.0)
MCV: 93.3 fL (ref 80.0–100.0)
Monocytes Absolute: 0.6 10*3/uL (ref 0.1–1.0)
Monocytes Relative: 13 %
Neutro Abs: 2.6 10*3/uL (ref 1.7–7.7)
Neutrophils Relative %: 60 %
Platelets: 173 10*3/uL (ref 150–400)
RBC: 2.7 MIL/uL — ABNORMAL LOW (ref 4.22–5.81)
RDW: 17.6 % — ABNORMAL HIGH (ref 11.5–15.5)
WBC: 4.4 10*3/uL (ref 4.0–10.5)
nRBC: 0 % (ref 0.0–0.2)

## 2022-06-10 LAB — GLUCOSE, CAPILLARY
Glucose-Capillary: 158 mg/dL — ABNORMAL HIGH (ref 70–99)
Glucose-Capillary: 91 mg/dL (ref 70–99)
Glucose-Capillary: 269 mg/dL — ABNORMAL HIGH (ref 70–99)
Glucose-Capillary: 167 mg/dL — ABNORMAL HIGH (ref 70–99)

## 2022-06-10 MED ORDER — MIDODRINE HCL 5 MG PO TABS
15.0000 mg | ORAL_TABLET | Freq: Three times a day (TID) | ORAL | Status: DC
  Administered 2022-06-11 – 2022-06-12 (×5): 15 mg via ORAL
  Filled 2022-06-10 (×5): qty 3

## 2022-06-10 MED ORDER — MIDODRINE HCL 5 MG PO TABS
10.0000 mg | ORAL_TABLET | Freq: Three times a day (TID) | ORAL | Status: DC
  Administered 2022-06-10 (×2): 10 mg via ORAL
  Filled 2022-06-10 (×2): qty 2

## 2022-06-10 MED ORDER — FLUDROCORTISONE ACETATE 0.1 MG PO TABS
0.2000 mg | ORAL_TABLET | Freq: Every day | ORAL | Status: DC
  Administered 2022-06-10 – 2022-06-12 (×3): 0.2 mg via ORAL
  Filled 2022-06-10 (×3): qty 2

## 2022-06-10 MED ORDER — MIDODRINE HCL 5 MG PO TABS: 5.0000 mg | ORAL_TABLET | Freq: Three times a day (TID) | ORAL | Status: DC

## 2022-06-10 MED ORDER — MIDODRINE HCL 5 MG PO TABS: 15.0000 mg | ORAL_TABLET | Freq: Three times a day (TID) | ORAL | Status: DC

## 2022-06-10 NOTE — Progress Notes (Signed)
Physical Therapy Treatment Patient Details Name: Nicholas Weiss MRN: [id number] DOB: 05/26/1950 Today's Date: 06/10/2022   History of Present Illness Patient is 72 year old male who was admitted 06/06/22 with DKA (glucose >500) in type I DM.  Pt with recent hx of sepsis, orthostatic hypotension, osteomyelitis, SDH, L toe amputations (Digit 1, partial digit 2 on 04/25/22), recurrent falls (related to orthostatic hypotension/hypoglycemia), L humerus/greater tubercle fx (WBAT) with recent hospital admissions and most recently at [facility].  Pt with other hx including DM1, HTN, and HCL    PT Comments     Pt admitted with above diagnosis.  Pt currently with functional limitations due to the deficits listed below (see PT Problem List). Pt is mod I for sit to supine, required min guard for STS from EOB to RW, static standing no UE support with pt requiring min guard to return to bed due to orthostatic hypotension, pt indicated asymptomatic denies dizziness however physiological response noted with dyspoiesis and inability to maintain grasp with R UE on RW with PT assisting to return to bed and placing pt in trendelenburg position. Pt indicated that he had eaten breakfast and had been up OOB mod I and amb in personal room with no concerns or reports of dizziness. Pt reported that this therapist can see the numbers and he knows they are not good because they are not in green. PT provided ed on pt orthostatics hypotension and fall risk. Pt indicated he was to d/c today pending HgB levels. Pt left in bed all needs met. Nursing and MD aware of BP findings and listed below.  Pt will benefit from skilled PT to increase their independence and safety with mobility to allow discharge to the venue listed below.   BP supine at rest 108/69 PR 90 Seated EOB initial 78/59 PR 97 pt donned L CAM and R sneaker seated EOB and PT reassessed BP 2 mins seated EOB 77/57 PR 107 Initial standing 62/46 PR 113 Pt unable to safely maintain  static standing for 3 mins to obtain full set of orthostatics and PT assisted to bed and placed in trendelenburg position Trendelenburg 117/73 PR 95      Recommendations for follow up therapy are one component of a multi-disciplinary discharge planning process, led by the attending physician.  Recommendations may be updated based on patient status, additional functional criteria and insurance authorization.  Follow Up Recommendations  Home health PT Can patient physically be transported by private vehicle: Yes   Assistance Recommended at Discharge Intermittent Supervision/Assistance  Patient can return home with the following Assistance with cooking/housework;Help with stairs or ramp for entrance   Equipment Recommendations  None recommended by PT    Recommendations for Other Services       Precautions / Restrictions Precautions Precautions: Fall;Other (comment) (orthostatic hypotension) Precaution Comments: L CAM boot when OOB Restrictions Weight Bearing Restrictions: No RUE Weight Bearing: Weight bearing as tolerated LUE Weight Bearing: Weight bearing as tolerated RLE Weight Bearing: Weight bearing as tolerated LLE Weight Bearing: Weight bearing as tolerated Other Position/Activity Restrictions: Reviewed most recent podiatry note - WBAT in CAM     Mobility  Bed Mobility Overal bed mobility: Modified Independent Bed Mobility: Supine to Sit, Sit to Supine     Supine to sit: Modified independent (Device/Increase time) Sit to supine: Min guard   General bed mobility comments: min guard for safety due orthostatics    Transfers Overall transfer level: Needs assistance Equipment used: Rolling walker (2 wheels) Transfers: Sit to/from  Stand Sit to Stand: Min guard (for safety)   Step pivot transfers:  (NT)            Ambulation/Gait               General Gait Details: unable to safely amb   Stairs             Wheelchair Mobility    Modified  Rankin (Stroke Patients Only)       Balance Overall balance assessment: Needs assistance Sitting-balance support: No upper extremity supported, Feet supported Sitting balance-Leahy Scale: Normal Sitting balance - Comments: Pt able to don LLE cam boot and sneaker sitting EOB without LOB   Standing balance support: No upper extremity supported Standing balance-Leahy Scale: Fair                              Cognition Arousal/Alertness: Awake/alert Behavior During Therapy: WFL for tasks assessed/performed Overall Cognitive Status: Within Functional Limits for tasks assessed                                          Exercises      General Comments        Pertinent Vitals/Pain Pain Assessment Pain Assessment: No/denies pain    Home Living                          Prior Function            PT Goals (current goals can now be found in the care plan section) Acute Rehab PT Goals Patient Stated Goal: hopeful to go home PT Goal Formulation: With patient Time For Goal Achievement: 06/22/22 Potential to Achieve Goals: Good Progress towards PT goals:  (limited due to physiological response today)    Frequency    Min 3X/week      PT Plan Current plan remains appropriate    Co-evaluation PT/OT/SLP Co-Evaluation/Treatment: Yes Reason for Co-Treatment: Complexity of the patient's impairments (multi-system involvement);For patient/therapist safety PT goals addressed during session: Mobility/safety with mobility;Other (comment) OT goals addressed during session: ADL's and self-care      AM-PAC PT "6 Clicks" Mobility   Outcome Measure  Help needed turning from your back to your side while in a flat bed without using bedrails?: None Help needed moving from lying on your back to sitting on the side of a flat bed without using bedrails?: A Little Help needed moving to and from a bed to a chair (including a wheelchair)?: A Little Help  needed standing up from a chair using your arms (e.g., wheelchair or bedside chair)?: A Little Help needed to walk in hospital room?: A Little Help needed climbing 3-5 steps with a railing? : A Little 6 Click Score: 19    End of Session Equipment Utilized During Treatment: Gait belt;Other (comment) (CAM boot) Activity Tolerance: Treatment limited secondary to medical complications (Comment) Patient left: in bed;with call bell/phone within reach;with bed alarm set ((4th floor, automatic alarms)) Nurse Communication: Mobility status;Other (comment) (BP findings and pt physiologica response) PT Visit Diagnosis: Other abnormalities of gait and mobility (R26.89);Unsteadiness on feet (R26.81);Muscle weakness (generalized) (M62.81)     Time: [id number] PT Time Calculation (min) (ACUTE ONLY): 24 min  Charges:  $Therapeutic Activity: 23-37 mins  Baird Lyons, PT   Adair Patter 06/10/2022, 11:54 AM

## 2022-06-10 NOTE — Inpatient Diabetes Management (Signed)
Inpatient Diabetes Program Recommendations  AACE/ADA: New Consensus Statement on Inpatient Glycemic Control (2015)  Target Ranges:  Prepandial:   less than 140 mg/dL      Peak postprandial:   less than 180 mg/dL (1-2 hours)      Critically ill patients:  140 - 180 mg/dL    Latest Reference Range & Units 06/09/22 07:32 06/09/22 07:55 06/09/22 11:39 06/09/22 16:15 06/09/22 16:46 06/09/22 17:45 06/09/22 20:36  Glucose-Capillary 70 - 99 mg/dL 63 (L) 96 141 (H)  4 units Novolog '@1300'$  59 (L) 69 (L) 155 (H) 260 (H)  3 units Novolog  13 units Semglee  (L): Data is abnormally low (H): Data is abnormally high  Latest Reference Range & Units 06/10/22 07:29  Glucose-Capillary 70 - 99 mg/dL 158 (H)  5 units Novolog   (H): Data is abnormally high    Home DM Meds: Farxiga 10 mg daily        Tresiba 20 units Daily        Novolog 0-9 units TID with meals   Current Orders: Semglee 13 units Q24H      Novolog 3 units TID with meals      Novolog Sensitive Correction Scale/ SSI (0-9 units) TID AC + HS      Farxiga 10 mg daily   Note Hypoglycemia yesterday AM--Semglee dose reduced last PM    MD- Note Hypoglycemia again yesterday at 4pm after receiving 4 units Novolog at 1pm (1 unit SSI + 3 units meal coverage)  Please consider reducing the Novolog Meal Coverage to 2 units TID with meals    ENDO: Dr. Gabriel Carina with [facility] Last seen 03/16/2022 Was told to take the following: Continue Tresiba 15 units every morning  Adjust the NovoLog insulin to be taken as  IF EATING Take 3 unit of NovoLog if your sugar is 80 - 120 Take 5 unit of NovoLog if your sugar is 121-170 Take 7 units of NovoLog if your sugar is 171-220 Take 9 units of NovoLog if your sugar is 221-270 Take 10 units of NovoLog if your sugar is 271 or higher  IF NOT EATING (AND YOU HAVE NOT TAKEN NOVOLOG IN THE LAST 3 HOURS) Take 2 units of NovoLog if your sugar is 201-250 Take 4 units of NovoLog if your sugar is  251-300 Take 6 units of NovoLog if your sugar is 301 or higher     --Will follow patient during hospitalization--  Wyn Quaker RN, MSN, [facility] Diabetes Coordinator Inpatient Glycemic Control Team Team Pager: [phone] (8a-5p)

## 2022-06-10 NOTE — Progress Notes (Signed)
Initial Nutrition Assessment  DOCUMENTATION CODES:   Non-severe (moderate) malnutrition in context of chronic illness  INTERVENTION:  - Carb Modified Diet.  - Glucerna Shake po TID, each supplement provides 220 kcal and 10 grams of protein - Encourage intake as tolerated.  - Continue Multivitamin with minerals daily to support micronutrient needs.  - Monitor weight trends.    NUTRITION DIAGNOSIS:   Moderate Malnutrition related to chronic illness as evidenced by mild fat depletion, moderate muscle depletion.  GOAL:   Patient will meet greater than or equal to 90% of their needs  MONITOR:   PO intake, Supplement acceptance, Weight trends  REASON FOR ASSESSMENT:   Malnutrition Screening Tool    ASSESSMENT:   72 y.o. male with medical history significant of type 1 diabetes, hypertension, recent amputation of his left toes who presented with DKA and GI bleed.    Patient reports he is unsure of a UBW but does not feel he has had any recent changes in his weight. Per EMR, weight has fluctuated the past year between 140-166#.   He reports eating well at home, usually 3 meals a day plus 2-3 Ensures daily. Shares his girlfriend cooks for him. Appetite is good at baseline.   Current appetite is also good and he endorses eating well since admit. Documented to be consuming 75-100% of meals with average of 94%. Has also been drinking the ordered Glucerna.  Encouraged patient to continue to eat well and drink nutrition supplements.    Medications reviewed and include: '500mg'$  vitamin C, '325mg'$  iron, MVI, insulin, '220mg'$  zinc  Labs reviewed:  -   NUTRITION - FOCUSED PHYSICAL EXAM:  Flowsheet Row Most Recent Value  Orbital Region Mild depletion  Upper Arm Region Moderate depletion  Thoracic and Lumbar Region Mild depletion  Buccal Region Mild depletion  Temple Region Moderate depletion  Clavicle Bone Region Mild depletion  Clavicle and Acromion Bone Region Moderate depletion   Scapular Bone Region Unable to assess  Dorsal Hand No depletion  Patellar Region Mild depletion  Anterior Thigh Region Mild depletion  Posterior Calf Region Mild depletion  Edema (RD Assessment) None  Hair Reviewed  Eyes Reviewed  Mouth Reviewed  Skin Reviewed  Nails Reviewed       Diet Order:   Diet Order             Diet Carb Modified Fluid consistency: Thin; Room service appropriate? Yes  Diet effective now                   EDUCATION NEEDS:  Education needs have been addressed  Skin:  Skin Assessment: Reviewed RN Assessment  Last BM:  3/10  Height:  Ht Readings from Last 1 Encounters:  06/07/22 '5\' 10"'$  (1.778 m)   Weight:  Wt Readings from Last 1 Encounters:  06/07/22 65.3 kg    BMI:  Body mass index is 20.66 kg/m.  Estimated Nutritional Needs:  Kcal:  1950-2150 kcals Protein:  85-100 grams Fluid:  >/= 1.9L    Nicholas Weiss RD, LDN For contact information, refer to [facility].

## 2022-06-10 NOTE — Progress Notes (Addendum)
PROGRESS NOTE    Nicholas Weiss  [id number] DOB: [birth year] DOA: 06/06/2022 PCP: Dion Body, MD    Chief Complaint  Patient presents with   Hyperglycemia    Brief Narrative:  Nicholas Weiss is a 72 y.o. male with medical history significant of type 1 diabetes, hypertension, recent amputation of his left toes.  Patient recently at [facility] with falls due to orthostatic hypotension, hypertensive urgency, osteomyelitis with partial second toe amputation, and subdural hematoma.  He was discharged from [facility] to [facility].  This a.m. at [facility] he was noted to have a glucose reading of greater than 500.  He was sent in for further evaluation.  In the ER, he was found to have a low hemoglobin and was found to have melanotic stools.  ER PA spoke with GI who plans to scope patient in the morning.  In the ER patient also was found to have hyperglycemia with anion gap of 16 and a beta hydroxybutyrate acid of greater than 3.  From SNF but has progressed to being able to go home with home health.    Assessment & Plan:   Principal Problem:   GI bleed Active Problems:   DKA (diabetic ketoacidosis) (HCC)   AKI (acute kidney injury) ([place])   Malnutrition of moderate degree   Orthostatic hypotension  #1 DKA in type I diabetic -Patient noted to be on both insulin and oral medications at home. -Patient noted to have presenting DKA, placed on the glucometer, transitioned to subcutaneous insulin and sliding scale insulin. -Hemoglobin A1c 9.1 (05/20/2022) -CBG of 158 this morning. -Continue Farxiga, Semglee 13 units daily, SSI.  2.  GI bleed -Patient noted to have had some melanotic stools. -Aspirin and Plavix held. -Patient seen by GI underwent upper endoscopy which showed normal esophagus, erosive gastropathy with no bleeding and no stigmata of recent bleeding, normal examined duodenum. -Patient status post 3 units packed red blood cells. -Hemoglobin currently stable at 8.2. -GI  recommending PPI daily x 2 months, okay to resume aspirin and Plavix as needed. -Follow H&H.  3.  History of orthostatic hypotension -Patient's midodrine and Florinef discontinued early on during the hospitalization as it was felt his orthostatic hypotension had resolved. -Patient assessed by PT today and noted to have a significant orthostatic hypotension with supine blood pressure 108/69, seated 78/59 and initial standing 62/46. -Resume placing back on regimen of Florinef and midodrine which patient recently discharged on.   -Place on TED hose. -Repeat orthostatics in the morning.  4.  Acute kidney injury -Improved with hydration. -Follow.  5.  History of multiple myeloma -Outpatient follow-up.  6.  Discoloration of second and third toe -Plain films with no acute abnormalities noted. -No signs of infection noted. -Outpatient follow-up with podiatrist, Dr. Amalia Hailey.  7.  History of substance abuse -Patient was at the facility prior to admission so doubt if patient is actively using.    DVT prophylaxis: SCDs Code Status: Full Family Communication: Updated patient.  No family at bedside. Disposition:   Status is: Inpatient Remains inpatient appropriate because: Severity of illness   Consultants:  Gastroenterology: Dr. Therisa Doyne 06/07/2022  Procedures:  Transfusion of 3 units PRBCs 06/06/2022-06/07/2022 Plain films of the second right toe 06/09/2022 Upper endoscopy 06/07/2022 per Dr. Therisa Doyne  Antimicrobials:  Anti-infectives (From admission, onward)    Start     Dose/Rate Route Frequency Ordered Stop   06/07/22 1030  acyclovir (ZOVIRAX) 200 MG capsule 400 mg  Status:  Discontinued  400 mg Oral 2 times daily 06/07/22 1016 06/07/22 1209         Subjective: Patient laying in bed.  Denies any chest pain.  No shortness of breath.  No lightheadedness and dizziness.  Denies any further bleeding.  Overall feels well and was hoping to go home today.  Patient noted by PT to have  significant orthostasis.  Objective: Vitals:   06/09/22 2039 06/10/22 0419 06/10/22 1142 06/10/22 2009  BP: (!) 157/96 (!) 143/93 108/69 102/65  Pulse: 88 78 90 91  Resp: 20   18  Temp: 98.1 F (36.7 C) 97.8 F (36.6 C)  98.7 F (37.1 C)  TempSrc: Oral Oral  Oral  SpO2: 100% 100% 100% 100%  Weight:      Height:        Intake/Output Summary (Last 24 hours) at 06/10/2022 2159 Last data filed at 06/10/2022 1500 Gross per 24 hour  Intake 820 ml  Output 200 ml  Net 620 ml   Filed Weights   06/06/22 0939 06/07/22 0835  Weight: 65.3 kg 65.3 kg    Examination:  General exam: Appears calm and comfortable  Respiratory system: Clear to auscultation. Respiratory effort normal. Cardiovascular system: S1 & S2 heard, RRR. No JVD, murmurs, rubs, gallops or clicks. No pedal edema. Gastrointestinal system: Abdomen is nondistended, soft and nontender. No organomegaly or masses felt. Normal bowel sounds heard. Central nervous system: Alert and oriented. No focal neurological deficits. Extremities: Symmetric 5 x 5 power. Skin: No rashes, lesions or ulcers Psychiatry: Judgement and insight appear normal. Mood & affect appropriate.     Data Reviewed: I have personally reviewed following labs and imaging studies  CBC: Recent Labs  Lab 06/06/22 1004 06/06/22 2218 06/07/22 0016 06/07/22 1000 06/08/22 0243 06/09/22 0417 06/10/22 0335  WBC 4.3   < > 4.1 7.1 4.8 3.5* 4.4  NEUTROABS 2.7  --   --   --   --   --  2.6  HGB 6.3*   < > 6.1* 9.9* 8.9* 7.9* 8.2*  HCT 20.5*   < > 19.1* 30.0* 27.5* 24.0* 25.2*  MCV 99.5   < > 94.1 90.6 92.0 92.0 93.3  PLT 228   < > 179 196 175 162 173   < > = values in this interval not displayed.    Basic Metabolic Panel: Recent Labs  Lab 06/06/22 1454 06/06/22 2017 06/07/22 0520 06/08/22 0243 06/09/22 0417  NA 134* 142 141 133* 135  K 4.2 3.6 3.7 3.9 3.9  CL 104 108 110 101 101  CO2 '23 26 25 22 28  '$ GLUCOSE 421* 109* 112* 345* 96  BUN 53* 51* 45*  32* 34*  CREATININE 1.65* 1.67* 1.35* 1.05 1.13  CALCIUM 7.8* 8.5* 8.3* 8.1* 8.2*    GFR: Estimated Creatinine Clearance: 57 mL/min (by C-G formula based on SCr of 1.13 mg/dL).  Liver Function Tests: No results for input(s): "AST", "ALT", "ALKPHOS", "BILITOT", "PROT", "ALBUMIN" in the last 168 hours.  CBG: Recent Labs  Lab 06/09/22 2036 06/10/22 0729 06/10/22 1153 06/10/22 1718 06/10/22 2019  GLUCAP 260* 158* 91 269* 167*     Recent Results (from the past 240 hour(s))  MRSA Next Gen by PCR, Nasal     Status: None   Collection Time: 06/06/22  2:50 PM   Specimen: Nasal Mucosa; Nasal Swab  Result Value Ref Range Status   MRSA by PCR Next Gen NOT DETECTED NOT DETECTED Final    Comment: (NOTE) The GeneXpert MRSA Assay (FDA  approved for NASAL specimens only), is one component of a comprehensive MRSA colonization surveillance program. It is not intended to diagnose MRSA infection nor to guide or monitor treatment for MRSA infections. Test performance is not FDA approved in patients less than 5 years old. Performed at [facility], [place] [address], [place], [place] [address]          Radiology Studies: DG Toe 2nd Right  Result Date: 06/09/2022 CLINICAL DATA:  [id number] Discoloration of skin of foot [id number] EXAM: RIGHT SECOND TOE COMPARISON:  09/07/2007 FINDINGS: There is no evidence of fracture or dislocation. There is no evidence of arthropathy or other focal bone abnormality. Mild osteopenia. Soft tissues are unremarkable. IMPRESSION: Negative. Electronically Signed   By: Lucrezia Europe M.D.   On: 06/09/2022 15:10        Scheduled Meds:  ascorbic acid  500 mg Oral Daily   aspirin EC  81 mg Oral q AM   atorvastatin  80 mg Oral QHS   Chlorhexidine Gluconate Cloth  6 each Topical Daily   clopidogrel  75 mg Oral q AM   dapagliflozin propanediol  10 mg Oral q AM   feeding supplement (GLUCERNA SHAKE)  237 mL Oral TID BM   ferrous sulfate  325 mg Oral Q  breakfast   fludrocortisone  0.2 mg Oral Daily   insulin aspart  0-5 Units Subcutaneous QHS   insulin aspart  0-9 Units Subcutaneous TID WC   insulin glargine-yfgn  13 Units Subcutaneous Q24H   midodrine  10 mg Oral TID WC   multivitamin with minerals  1 tablet Oral Daily   pantoprazole  40 mg Intravenous Q24H   sodium chloride flush  10-40 mL Intracatheter Q12H   tamsulosin  0.4 mg Oral Daily   zinc sulfate  220 mg Oral Daily   Continuous Infusions:  sodium chloride 10 mL/hr at 06/08/22 0300     LOS: 4 days    Time spent: 40 minutes    Irine Seal, MD [facility]   To contact the attending provider between 7A-7P or the covering provider during after hours 7P-7A, please log into the web site [email or web address] and access using universal [place] password for that web site. If you do not have the password, please call the hospital operator.  06/10/2022, 9:59 PM

## 2022-06-11 DIAGNOSIS — E440 Moderate protein-calorie malnutrition: Secondary | ICD-10-CM

## 2022-06-11 LAB — BASIC METABOLIC PANEL
Anion gap: 8 (ref 5–15)
BUN: 42 mg/dL — ABNORMAL HIGH (ref 8–23)
CO2: 26 mmol/L (ref 22–32)
Calcium: 7.8 mg/dL — ABNORMAL LOW (ref 8.9–10.3)
Chloride: 102 mmol/L (ref 98–111)
Creatinine, Ser: 1.08 mg/dL (ref 0.61–1.24)
GFR, Estimated: >60 mL/min (ref >60)
Glucose, Bld: 137 mg/dL — ABNORMAL HIGH (ref 70–99)
Potassium: 3.6 mmol/L (ref 3.5–5.1)
Sodium: 136 mmol/L (ref 135–145)

## 2022-06-11 LAB — GLUCOSE, CAPILLARY
Glucose-Capillary: 140 mg/dL — ABNORMAL HIGH (ref 70–99)
Glucose-Capillary: 318 mg/dL — ABNORMAL HIGH (ref 70–99)
Glucose-Capillary: 110 mg/dL — ABNORMAL HIGH (ref 70–99)
Glucose-Capillary: 210 mg/dL — ABNORMAL HIGH (ref 70–99)

## 2022-06-11 LAB — CBC
HCT: 22.7 % — ABNORMAL LOW (ref 39.0–52.0)
Hemoglobin: 7.4 g/dL — ABNORMAL LOW (ref 13.0–17.0)
MCH: 30.8 pg (ref 26.0–34.0)
MCHC: 32.6 g/dL (ref 30.0–36.0)
MCV: 94.6 fL (ref 80.0–100.0)
Platelets: 184 10*3/uL (ref 150–400)
RBC: 2.4 MIL/uL — ABNORMAL LOW (ref 4.22–5.81)
RDW: 17.5 % — ABNORMAL HIGH (ref 11.5–15.5)
WBC: 3.8 10*3/uL — ABNORMAL LOW (ref 4.0–10.5)
nRBC: 0 % (ref 0.0–0.2)

## 2022-06-11 LAB — HEMOGLOBIN AND HEMATOCRIT, BLOOD
HCT: 22 % — ABNORMAL LOW (ref 39.0–52.0)
Hemoglobin: 7.1 g/dL — ABNORMAL LOW (ref 13.0–17.0)

## 2022-06-11 NOTE — Progress Notes (Signed)
PROGRESS NOTE    Nicholas Weiss  [id number] DOB: [birth year] DOA: 06/06/2022 PCP: Dion Body, MD    Chief Complaint  Patient presents with   Hyperglycemia    Brief Narrative:  Nicholas Weiss is a 72 y.o. male with medical history significant of type 1 diabetes, hypertension, recent amputation of his left toes.  Patient recently at [facility] with falls due to orthostatic hypotension, hypertensive urgency, osteomyelitis with partial second toe amputation, and subdural hematoma.  He was discharged from [facility] to [facility].  This a.m. at [facility] he was noted to have a glucose reading of greater than 500.  He was sent in for further evaluation.  In the ER, he was found to have a low hemoglobin and was found to have melanotic stools.  ER PA spoke with GI who plans to scope patient in the morning.  In the ER patient also was found to have hyperglycemia with anion gap of 16 and a beta hydroxybutyrate acid of greater than 3.  From SNF but has progressed to being able to go home with home health.    Assessment & Plan:   Principal Problem:   GI bleed Active Problems:   DKA (diabetic ketoacidosis) (HCC)   AKI (acute kidney injury) ([place])   Malnutrition of moderate degree   Orthostatic hypotension  #1 DKA in type I diabetic -Patient noted to be on both insulin and oral medications at home. -Patient noted to have presenting DKA, placed on the glucometer, transitioned to subcutaneous insulin and sliding scale insulin. -Hemoglobin A1c 9.1 (05/20/2022) -CBG of 140 this morning. -Continue Semglee 13 units daily, SSI. -Patient noted to have presented in DKA, will discontinue Farxiga for now. -Outpatient follow-up.  2.  GI bleed -Patient noted to have had some melanotic stools. -Aspirin and Plavix held. -Patient seen by GI underwent upper endoscopy which showed normal esophagus, erosive gastropathy with no bleeding and no stigmata of recent bleeding, normal examined  duodenum. -Patient status post 3 units packed red blood cells. -Hemoglobin at 7.4 this morning. -Repeat H&H this afternoon. -GI recommending PPI daily x 2 months, okay to resume aspirin and Plavix as needed. -Aspirin and Plavix have been resumed.   3.  History of orthostatic hypotension -Patient's midodrine and Florinef discontinued early on during the hospitalization as it was felt his orthostatic hypotension had resolved. -Patient assessed by PT 06/10/2022, and noted to have a significant orthostatic hypotension with supine blood pressure 108/69, seated 78/59 and initial standing 62/46. -Patient placed back on prior regimen of Florinef and midodrine which patient was recently discharged from [facility] on.  TED hose added. -Repeat orthostatics this morning with improvement, patient states was asymptomatic. -Orthostatics and went from supine 01 25/74 pulse of 82, sitting 114/77 pulse of 87, standing 103/61 pulse of 93. -Follow.  4.  Acute kidney injury -Improved with hydration.    5.  History of multiple myeloma -Outpatient follow-up.  6.  Discoloration of second and third toe -Plain films with no acute abnormalities noted. -No signs of infection noted. -Outpatient follow-up with podiatrist, Dr. Amalia Hailey.  7.  History of substance abuse -Patient was at the facility prior to admission so doubt if patient is actively using.    DVT prophylaxis: SCDs Code Status: Full Family Communication: Updated patient.  No family at bedside. Disposition: Home when hemoglobin is stabilized hopefully tomorrow.  Status is: Inpatient Remains inpatient appropriate because: Severity of illness   Consultants:  Gastroenterology: Dr. Therisa Doyne 06/07/2022  Procedures:  Transfusion of 3  units PRBCs 06/06/2022-06/07/2022 Plain films of the second right toe 06/09/2022 Upper endoscopy 06/07/2022 per Dr. Therisa Doyne  Antimicrobials:  Anti-infectives (From admission, onward)    Start     Dose/Rate Route Frequency Ordered  Stop   06/07/22 1030  acyclovir (ZOVIRAX) 200 MG capsule 400 mg  Status:  Discontinued        400 mg Oral 2 times daily 06/07/22 1016 06/07/22 1209         Subjective: Laying in bed.  Overall feeling better.  No chest pain or shortness of breath.  No lightheadedness or dizziness.  Denies any symptoms when orthostatics was checked this morning.  Denies any overt bleeding however stated had some darkish blackish stool this morning.  Objective: Vitals:   06/10/22 1142 06/10/22 2009 06/11/22 0434 06/11/22 1212  BP: 108/69 102/65 133/84 134/78  Pulse: 90 91 78 89  Resp:  '18 18 18  '$ Temp:  98.7 F (37.1 C) 98.1 F (36.7 C) 98.5 F (36.9 C)  TempSrc:  Oral Oral Oral  SpO2: 100% 100%  100%  Weight:      Height:        Intake/Output Summary (Last 24 hours) at 06/11/2022 1254 Last data filed at 06/11/2022 0900 Gross per 24 hour  Intake 1080 ml  Output 700 ml  Net 380 ml    Filed Weights   06/06/22 0939 06/07/22 0835  Weight: 65.3 kg 65.3 kg    Examination:  General exam: NAD. Respiratory system: Lungs clear to auscultation bilaterally.  No wheezes, no crackles, no rhonchi.  Fair air movement.  Speaking in full sentences.  Cardiovascular system: Regular rate rhythm no murmurs rubs or gallops.  No JVD.  No lower extremity edema.   Gastrointestinal system: Abdomen is soft, nontender, nondistended, positive bowel sounds.  No rebound.  No guarding.   Central nervous system: Alert and oriented. No focal neurological deficits. Extremities: Symmetric 5 x 5 power. Skin: No rashes, lesions or ulcers Psychiatry: Judgement and insight appear normal. Mood & affect appropriate.     Data Reviewed: I have personally reviewed following labs and imaging studies  CBC: Recent Labs  Lab 06/06/22 1004 06/06/22 2218 06/07/22 1000 06/08/22 0243 06/09/22 0417 06/10/22 0335 06/11/22 0231  WBC 4.3   < > 7.1 4.8 3.5* 4.4 3.8*  NEUTROABS 2.7  --   --   --   --  2.6  --   HGB 6.3*   < > 9.9*  8.9* 7.9* 8.2* 7.4*  HCT 20.5*   < > 30.0* 27.5* 24.0* 25.2* 22.7*  MCV 99.5   < > 90.6 92.0 92.0 93.3 94.6  PLT 228   < > 196 175 162 173 184   < > = values in this interval not displayed.     Basic Metabolic Panel: Recent Labs  Lab 06/06/22 2017 06/07/22 0520 06/08/22 0243 06/09/22 0417 06/11/22 0231  NA 142 141 133* 135 136  K 3.6 3.7 3.9 3.9 3.6  CL 108 110 101 101 102  CO2 '26 25 22 28 26  '$ GLUCOSE 109* 112* 345* 96 137*  BUN 51* 45* 32* 34* 42*  CREATININE 1.67* 1.35* 1.05 1.13 1.08  CALCIUM 8.5* 8.3* 8.1* 8.2* 7.8*     GFR: Estimated Creatinine Clearance: 59.6 mL/min (by C-G formula based on SCr of 1.08 mg/dL).  Liver Function Tests: No results for input(s): "AST", "ALT", "ALKPHOS", "BILITOT", "PROT", "ALBUMIN" in the last 168 hours.  CBG: Recent Labs  Lab 06/10/22 1153 06/10/22 1718 06/10/22 2019 06/11/22  0754 06/11/22 1210  GLUCAP 91 269* 167* 140* 318*      Recent Results (from the past 240 hour(s))  MRSA Next Gen by PCR, Nasal     Status: None   Collection Time: 06/06/22  2:50 PM   Specimen: Nasal Mucosa; Nasal Swab  Result Value Ref Range Status   MRSA by PCR Next Gen NOT DETECTED NOT DETECTED Final    Comment: (NOTE) The GeneXpert MRSA Assay (FDA approved for NASAL specimens only), is one component of a comprehensive MRSA colonization surveillance program. It is not intended to diagnose MRSA infection nor to guide or monitor treatment for MRSA infections. Test performance is not FDA approved in patients less than 55 years old. Performed at [facility], [place] [address], [place], [place] [address]          Radiology Studies: DG Toe 2nd Right  Result Date: 06/09/2022 CLINICAL DATA:  [id number] Discoloration of skin of foot [id number] EXAM: RIGHT SECOND TOE COMPARISON:  09/07/2007 FINDINGS: There is no evidence of fracture or dislocation. There is no evidence of arthropathy or other focal bone abnormality. Mild osteopenia. Soft  tissues are unremarkable. IMPRESSION: Negative. Electronically Signed   By: Lucrezia Europe M.D.   On: 06/09/2022 15:10        Scheduled Meds:  ascorbic acid  500 mg Oral Daily   aspirin EC  81 mg Oral q AM   atorvastatin  80 mg Oral QHS   Chlorhexidine Gluconate Cloth  6 each Topical Daily   clopidogrel  75 mg Oral q AM   feeding supplement (GLUCERNA SHAKE)  237 mL Oral TID BM   ferrous sulfate  325 mg Oral Q breakfast   fludrocortisone  0.2 mg Oral Daily   insulin aspart  0-5 Units Subcutaneous QHS   insulin aspart  0-9 Units Subcutaneous TID WC   insulin glargine-yfgn  13 Units Subcutaneous Q24H   midodrine  15 mg Oral TID WC   multivitamin with minerals  1 tablet Oral Daily   pantoprazole  40 mg Intravenous Q24H   sodium chloride flush  10-40 mL Intracatheter Q12H   tamsulosin  0.4 mg Oral Daily   zinc sulfate  220 mg Oral Daily   Continuous Infusions:  sodium chloride 10 mL/hr at 06/08/22 0300     LOS: 5 days    Time spent: 40 minutes    Irine Seal, MD [facility]   To contact the attending provider between 7A-7P or the covering provider during after hours 7P-7A, please log into the web site [email or web address] and access using universal [place] password for that web site. If you do not have the password, please call the hospital operator.  06/11/2022, 12:54 PM

## 2022-06-11 NOTE — Inpatient Diabetes Management (Signed)
Inpatient Diabetes Program Recommendations  AACE/ADA: New Consensus Statement on Inpatient Glycemic Control (2015)  Target Ranges:  Prepandial:   less than 140 mg/dL      Peak postprandial:   less than 180 mg/dL (1-2 hours)      Critically ill patients:  140 - 180 mg/dL   Lab Results  Component Value Date   GLUCAP 110 (H) 06/11/2022   HGBA1C 9.1 (H) 05/20/2022    Review of Glycemic Control  CBGs today: 140, 318, 110 mg/dL Eating 100%  Current orders for Inpatient glycemic control: Semglee 13 QD, Novolog 0-9 units TID with meals  Inpatient Diabetes Program Recommendations:    Add Novolog 2 units TID with meals if eating > 50%  Would not send home on Farxiga as it may increase risk of DKA in Type 1 DM  Continue to follow.  Thank you. Lorenda Peck, RD, LDN, [place] Inpatient Diabetes Coordinator [phone]

## 2022-06-11 NOTE — Plan of Care (Signed)
  Problem: Activity: Goal: Ability to tolerate increased activity will improve Outcome: Progressing   Problem: Coping: Goal: Ability to adjust to condition or change in health will improve Outcome: Progressing   Problem: Nutritional: Goal: Maintenance of adequate nutrition will improve Outcome: Progressing   Problem: Skin Integrity: Goal: Risk for impaired skin integrity will decrease Outcome: Progressing

## 2022-06-11 NOTE — Care Management Important Message (Signed)
Important Message  Patient Details  Name: Nicholas Weiss MRN: [id number] Date of Birth: 04/02/1950   Medicare Important Message Given:  Yes     Memory Argue 06/11/2022, 3:35 PM

## 2022-06-11 NOTE — Telephone Encounter (Signed)
Called and left message for call back that it is ok to remove sutures.

## 2022-06-11 NOTE — Progress Notes (Signed)
Mobility Specialist - Progress Note   06/11/22 1232  Mobility  Activity Ambulated with assistance in hallway  Level of Assistance Modified independent, requires aide device or extra time  Assistive Device Front wheel walker  Distance Ambulated (ft) 350 ft  RUE Weight Bearing WBAT  Activity Response Tolerated well  Mobility Referral Yes  $Mobility charge 1 Mobility   Pt received in bed and agreeable to mobility. No complaints during session. Pt to bed after session with all needs met.    [employer]

## 2022-06-12 LAB — CBC
HCT: 21.8 % — ABNORMAL LOW (ref 39.0–52.0)
Hemoglobin: 7.1 g/dL — ABNORMAL LOW (ref 13.0–17.0)
MCH: 30.9 pg (ref 26.0–34.0)
MCHC: 32.6 g/dL (ref 30.0–36.0)
MCV: 94.8 fL (ref 80.0–100.0)
Platelets: 187 10*3/uL (ref 150–400)
RBC: 2.3 MIL/uL — ABNORMAL LOW (ref 4.22–5.81)
RDW: 17.6 % — ABNORMAL HIGH (ref 11.5–15.5)
WBC: 4.1 10*3/uL (ref 4.0–10.5)
nRBC: 0 % (ref 0.0–0.2)

## 2022-06-12 LAB — GLUCOSE, CAPILLARY
Glucose-Capillary: 114 mg/dL — ABNORMAL HIGH (ref 70–99)
Glucose-Capillary: 284 mg/dL — ABNORMAL HIGH (ref 70–99)

## 2022-06-12 LAB — BASIC METABOLIC PANEL
Anion gap: 5 (ref 5–15)
BUN: 41 mg/dL — ABNORMAL HIGH (ref 8–23)
CO2: 26 mmol/L (ref 22–32)
Calcium: 8.1 mg/dL — ABNORMAL LOW (ref 8.9–10.3)
Chloride: 105 mmol/L (ref 98–111)
Creatinine, Ser: 1.07 mg/dL (ref 0.61–1.24)
GFR, Estimated: >60 mL/min (ref >60)
Glucose, Bld: 113 mg/dL — ABNORMAL HIGH (ref 70–99)
Potassium: 3.5 mmol/L (ref 3.5–5.1)
Sodium: 136 mmol/L (ref 135–145)

## 2022-06-12 MED ORDER — NOVOLOG 100 UNIT/ML IJ SOLN: INTRAMUSCULAR | 1 refills | Status: DC

## 2022-06-12 MED ORDER — PANTOPRAZOLE SODIUM 40 MG PO TBEC: 40.0000 mg | DELAYED_RELEASE_TABLET | Freq: Two times a day (BID) | ORAL | Status: DC

## 2022-06-12 NOTE — Progress Notes (Signed)
Physical Therapy Treatment Patient Details Name: Nicholas Weiss MRN: [id number] DOB: [birth year] Today's Date: 06/12/2022   History of Present Illness Patient is 72 year old male who was admitted 06/06/22 with DKA (glucose >500) in type I DM.  Pt with recent hx of sepsis, orthostatic hypotension, osteomyelitis, SDH, L toe amputations (Digit 1, partial digit 2 on 04/25/22), recurrent falls (related to orthostatic hypotension/hypoglycemia), L humerus/greater tubercle fx (WBAT) with recent hospital admissions and most recently at [facility].  Pt with other hx including DM1, HTN, and HCL    PT Comments     Pt admitted with above diagnosis.  Pt currently with functional limitations due to the deficits listed below (see PT Problem List). PT reviewed progress notes and vital signs with medication changes to address orthostatic hypotension. Pt in bed and reports he is ready to go home. Agreeable to PT. PT monitored t/o intervention pt response and S and S of orthostatic hypotension with negative findings. Pt denies dizziness and pain. Pt is mod I for bed mobility, S for transfers and gait tasks up to 350 feet with RW and required min guard for steps with B handrail. Pt will benefit from skilled PT to increase their independence and safety with mobility to allow discharge to the venue listed below.     Recommendations for follow up therapy are one component of a multi-disciplinary discharge planning process, led by the attending physician.  Recommendations may be updated based on patient status, additional functional criteria and insurance authorization.  Follow Up Recommendations  Home health PT Can patient physically be transported by private vehicle: Yes   Assistance Recommended at Discharge Set up Supervision/Assistance  Patient can return home with the following Assistance with cooking/housework;Help with stairs or ramp for entrance;Assist for transportation   Equipment Recommendations  None recommended by  PT    Recommendations for Other Services       Precautions / Restrictions Precautions Precautions: Fall Precaution Comments: L CAM boot when OOB Required Braces or Orthoses: Sling Restrictions Weight Bearing Restrictions: No RUE Weight Bearing: Weight bearing as tolerated LUE Weight Bearing: Weight bearing as tolerated RLE Weight Bearing: Weight bearing as tolerated LLE Weight Bearing: Weight bearing as tolerated Other Position/Activity Restrictions: Reviewed most recent podiatry note - WBAT in CAM     Mobility  Bed Mobility Overal bed mobility: Modified Independent Bed Mobility: Supine to Sit, Sit to Supine     Supine to sit: Modified independent (Device/Increase time) Sit to supine: Modified independent (Device/Increase time)        Transfers Overall transfer level: Needs assistance Equipment used: Rolling walker (2 wheels) Transfers: Sit to/from Stand Sit to Stand: Supervision (for safety)                Ambulation/Gait Ambulation/Gait assistance: Supervision Gait Distance (Feet): 350 Feet Assistive device: Rolling walker (2 wheels) Gait Pattern/deviations: Step-through pattern Gait velocity: good reciprocal pattern, LEs passing in stance phase and adequate B foot clearance     General Gait Details: L CAM boot donned   Stairs Stairs: Yes Stairs assistance: Min guard Stair Management: Two rails Number of Stairs: 10 General stair comments: pt able to perform reciprocal pattern on steps ascending and descending   Wheelchair Mobility    Modified Rankin (Stroke Patients Only)       Balance Overall balance assessment: Mild deficits observed, not formally tested   Sitting balance-Leahy Scale: Normal     Standing balance support: No upper extremity supported Standing balance-Leahy Scale: Fair  Cognition Arousal/Alertness: Awake/alert Behavior During Therapy: WFL for tasks assessed/performed Overall  Cognitive Status: Within Functional Limits for tasks assessed                                 General Comments: patient was noted to have moments of irritation during session but over all appropirate.        Exercises      General Comments General comments (skin integrity, edema, etc.): compression stockings doned      Pertinent Vitals/Pain Pain Assessment Pain Assessment: No/denies pain    Home Living                          Prior Function            PT Goals (current goals can now be found in the care plan section) Acute Rehab PT Goals Patient Stated Goal: hopeful to go home PT Goal Formulation: With patient Time For Goal Achievement: 06/22/22 Potential to Achieve Goals: Good Progress towards PT goals: Progressing toward goals    Frequency    Min 3X/week      PT Plan Current plan remains appropriate    Co-evaluation PT/OT/SLP Co-Evaluation/Treatment: Yes Reason for Co-Treatment: Complexity of the patient's impairments (multi-system involvement);For patient/therapist safety PT goals addressed during session: Mobility/safety with mobility;Other (comment) OT goals addressed during session: ADL's and self-care      AM-PAC PT "6 Clicks" Mobility   Outcome Measure  Help needed turning from your back to your side while in a flat bed without using bedrails?: None Help needed moving from lying on your back to sitting on the side of a flat bed without using bedrails?: None Help needed moving to and from a bed to a chair (including a wheelchair)?: A Little Help needed standing up from a chair using your arms (e.g., wheelchair or bedside chair)?: A Little Help needed to walk in hospital room?: A Little Help needed climbing 3-5 steps with a railing? : A Little 6 Click Score: 20    End of Session Equipment Utilized During Treatment: Gait belt;Other (comment) (CAM boot)   Patient left: in chair;with call bell/phone within reach ((4th floor,  automatic alarms)) Nurse Communication: Mobility status PT Visit Diagnosis: Other abnormalities of gait and mobility (R26.89);Unsteadiness on feet (R26.81);Muscle weakness (generalized) (M62.81)     Time: [id number] PT Time Calculation (min) (ACUTE ONLY): 23 min  Charges:  $Gait Training: 8-22 mins $Therapeutic Activity: 8-22 mins                     Baird Lyons, PT    Adair Patter 06/12/2022, 12:28 PM

## 2022-06-12 NOTE — Progress Notes (Addendum)
The patient is a&ox4 and ambulatory with a walker. Discharge instructions reviewed, questions, concerns were denied.No change from am assessment. PICC site clean dry intact.

## 2022-06-12 NOTE — Discharge Summary (Addendum)
Physician Discharge Summary   Patient: Nicholas Weiss MRN: [id number] DOB: 1950-09-22  Admit date:     06/06/2022  Discharge date: 06/12/22  Discharge Physician: Erin Hearing   PCP: Dion Body, MD   Recommendations at discharge:   Patient will discharge to home.  He states he will utilize [facility] for any needs Continue previous wound care regards to recovery from treatment for osteomyelitis and partial second toe amputation.  Continue walking boot. Patient will need to follow-up with his established podiatrist Dr. Amalia Hailey 1 to 2 weeks after discharge Patient underwent EGD this admission with only erosive gastropathy found without any signs of bleeding.  Patient can follow-up with gastroenterologist Dr. Roena Malady as needed Gastroenterology recommended continuing PPI twice daily x 2 months then decrease to once daily Patient will need to continue to check CBGs frequently his primary care physician if they stay greater than 250 for more than 4 readings  Discharge Diagnoses: Principal Problem:   GI bleed Active Problems:   AKI (acute kidney injury) ([place])   DKA (diabetic ketoacidosis) ([place])   Malnutrition of moderate degree   Orthostatic hypotension   Hospital Course: Nicholas Weiss is a 72 y.o. male with medical history significant of type 1 diabetes, hypertension, recent amputation of his left toes.  Patient recently at [facility] with falls due to orthostatic hypotension, hypertensive urgency, osteomyelitis with partial second toe amputation, and subdural hematoma.  He was discharged from [facility] to [facility].  This a.m. at [facility] he was noted to have a glucose reading of greater than 500.  He was sent in for further evaluation.  In the ER, he was found to have a low hemoglobin and was found to have melanotic stools.  ER PA spoke with GI who plans to scope patient in the morning.  In the ER patient also was found to have hyperglycemia with anion gap of 16 and a beta  hydroxybutyrate acid of greater than 3.   Initially presented SNF but has progressed to being able to go home with home health.   Assessment and Plan:  1 DKA in type I diabetic -Patient noted to be on both insulin and oral medications at home. -Presented with DKA, placed on Glucomander/insulin infusion transitioned to subcutaneous insulin and sliding scale insulin. -Hemoglobin A1c 9.1 (05/20/2022) -Continue preadmission Tresiba, Farxiga and meal coverage -Outpatient follow-up.   2.  GI bleed -Presented with melanotic stools -Aspirin and Plavix held initially -Patient seen by GI underwent upper endoscopy which showed normal esophagus, erosive gastropathy with no bleeding and no stigmata of recent bleeding, normal examined duodenum. -Patient status post 3 units packed red blood cells. -Hemoglobin at 7.1 on date of discharge -GI recommending PPI daily x 2 months,  -Aspirin and Plavix have been resumed.    3.  History of orthostatic hypotension -Patient's midodrine and Florinef discontinued early on during the hospitalization as it was felt his orthostatic hypotension had resolved. -Patient assessed by PT 06/10/2022, and noted to have a significant orthostatic hypotension with supine blood pressure 108/69, seated 78/59 and initial standing 62/46. -Patient placed back on prior regimen of Florinef and midodrine which patient was recently discharged from [facility] on.  TED hose added. -Repeat orthostatics this morning with improvement, patient states was asymptomatic.Marland Kitchen   4.  Acute kidney injury -Resolved with hydration  -Creatinine on date of discharge 1.7  5.  History of multiple myeloma -Outpatient follow-up.   6.  Discoloration of second and third toe -Plain films with no  acute abnormalities noted. -No signs of infection noted. -Outpatient follow-up with podiatrist, Dr. Amalia Hailey.   7.  History of substance abuse -Patient was at the facility prior to admission so doubt if patient is  actively using.         Consultants: Gastroenterology Procedures performed: EGD Disposition: Home Diet recommendation:  Carb modified diet DISCHARGE MEDICATION: Allergies as of 06/12/2022       Reactions   Penicillins Other (See Comments)   Childhood allergy -tolerated amoxil 03/2022 Unknown reaction Has patient had a PCN reaction causing immediate rash, facial/tongue/throat swelling, SOB or lightheadedness with hypotension: No Has patient had a PCN reaction causing severe rash involving mucus membranes or skin necrosis: No Has patient had a PCN reaction that required hospitalization No Has patient had a PCN reaction occurring within the last 10 years: No If all of the above answers are "NO", then may proceed with Cephalosporin use.        Medication List     STOP taking these medications    acyclovir 200 MG capsule Commonly known as: ZOVIRAX       TAKE these medications    acetaminophen 500 MG tablet Commonly known as: TYLENOL Take 1,000 mg by mouth daily as needed for mild pain or moderate pain.   ascorbic acid 500 MG tablet Commonly known as: VITAMIN C Take 1 tablet (500 mg total) by mouth daily.   aspirin EC 81 MG tablet Take 81 mg by mouth in the morning.   atorvastatin 80 MG tablet Commonly known as: LIPITOR Take 1 tablet (80 mg total) by mouth at bedtime. Hold while taking Paxlovid   Baclofen 5 MG Tabs Take 5 mg by mouth daily as needed (muscle spasm).   clopidogrel 75 MG tablet Commonly known as: PLAVIX Take 75 mg by mouth in the morning.   docusate sodium 100 MG capsule Commonly known as: COLACE Take 100 mg by mouth daily as needed for mild constipation.   Farxiga 10 MG Tabs tablet Generic drug: dapagliflozin propanediol Take 10 mg by mouth in the morning.   feeding supplement (GLUCERNA SHAKE) Liqd Take 237 mLs by mouth 3 (three) times daily between meals.   ferrous sulfate 325 (65 FE) MG tablet Take 1 tablet (325 mg total) by mouth  daily with breakfast.   fludrocortisone 0.1 MG tablet Commonly known as: FLORINEF Take 2 tablets (0.2 mg total) by mouth daily.   guaiFENesin-dextromethorphan 100-10 MG/5ML syrup Commonly known as: ROBITUSSIN DM Take 10 mLs by mouth every 4 (four) hours as needed for cough.   HYDROcodone-acetaminophen 5-325 MG tablet Commonly known as: NORCO/VICODIN Take 1 tablet by mouth every 6 (six) hours as needed for severe pain.   insulin lispro 100 UNIT/ML injection Commonly known as: HUMALOG Inject into the skin 3 (three) times daily before meals.   midodrine 5 MG tablet Commonly known as: PROAMATINE Take 3 tablets (15 mg total) by mouth 3 (three) times daily with meals.   multivitamin with minerals Tabs tablet Take 1 tablet by mouth daily.   NovoLOG 100 UNIT/ML injection Generic drug: insulin aspart Inject 0-9 Units into the skin 3 (three) times daily with meals.   ondansetron 8 MG tablet Commonly known as: ZOFRAN Take 8 mg by mouth daily as needed for nausea or vomiting.   pantoprazole 40 MG tablet Commonly known as: Protonix Take 1 tablet (40 mg total) by mouth 2 (two) times daily. Take twice daily for 2 months then decrease to once daily   tamsulosin 0.4 MG  Caps capsule Commonly known as: FLOMAX Take 0.4 mg by mouth daily.   [name] FlexTouch 100 UNIT/ML FlexTouch Pen Generic drug: insulin degludec Inject 15 Units into the skin daily. What changed: how much to take   zinc sulfate 220 (50 Zn) MG capsule Take 1 capsule (220 mg total) by mouth daily.        Follow-up Information     [facility] [place] Follow up.   Specialty: [facility] Contact information: [place] [address] [phone]                Discharge Exam: Danley Danker Weights   06/06/22 [id number] 06/07/22 0835  Weight: 65.3 kg 65.3 kg   Subjective: Eager to discharge home.  No complaints verbalized.  Physical exam: Constitutional: NAD, calm,  comfortable Respiratory: clear to auscultation bilaterally, no wheezing, no crackles. Normal respiratory effort. No accessory muscle use.  Cardiovascular: Regular rate and rhythm, no murmurs / rubs / gallops. No extremity edema. 2+ pedal pulses.  Abdomen: no tenderness, no masses palpated. No hepatosplenomegaly. Bowel sounds positive.  Musculoskeletal: no clubbing / cyanosis. No joint deformity upper and lower extremities. Good ROM, no contractures. Normal muscle tone.  Walking boot applied to left lower extremity Skin: no rashes, lesions, ulcers. No induration Neurologic: CN 2-12 grossly intact. Sensation intact,  Strength 5/5 x all 4 extremities.  Psychiatric: Normal judgment and insight. Alert and oriented x 3. Normal mood.    Condition at discharge: stable  The results of significant diagnostics from this hospitalization (including imaging, microbiology, ancillary and laboratory) are listed below for reference.   Imaging Studies: DG Toe 2nd Right  Result Date: 06/09/2022 CLINICAL DATA:  [id number] Discoloration of skin of foot [id number] EXAM: RIGHT SECOND TOE COMPARISON:  09/07/2007 FINDINGS: There is no evidence of fracture or dislocation. There is no evidence of arthropathy or other focal bone abnormality. Mild osteopenia. Soft tissues are unremarkable. IMPRESSION: Negative. Electronically Signed   By: Lucrezia Europe M.D.   On: 06/09/2022 15:10   [place] EKG SITE RITE  Result Date: 06/06/2022 If Site Rite image not attached, placement could not be confirmed due to current cardiac rhythm.  CT Head Wo Contrast  Result Date: 05/15/2022 CLINICAL DATA:  Subdural hematoma follow-up EXAM: CT HEAD WITHOUT CONTRAST TECHNIQUE: Contiguous axial images were obtained from the base of the skull through the vertex without intravenous contrast. RADIATION DOSE REDUCTION: This exam was performed according to the departmental dose-optimization program which includes automated exposure control, adjustment of the mA and/or  kV according to patient size and/or use of iterative reconstruction technique. COMPARISON:  05/14/2022 FINDINGS: Brain: Redemonstrated small subdural hematoma along the right frontal convexity, measuring up to 7 mm, unchanged. Unchanged mild mass effect on the right frontal lobe. No significant midline shift. No acute infarct, parenchymal hemorrhage, or mass. No hydrocephalus. Cerebral volume is within normal limits for age. Vascular: No hyperdense vessel. Skull: Negative for fracture or focal lesion. Sinuses/Orbits: No acute finding. Other: The mastoid air cells are well aerated. IMPRESSION: Unchanged small subdural hematoma along the right frontal convexity, measuring up to 7 mm. Unchanged mild mass effect on the right frontal lobe, without significant midline shift. Electronically Signed   By: Merilyn Baba M.D.   On: 05/15/2022 03:13   DG Humerus Left  Result Date: 05/15/2022 CLINICAL DATA:  Status post fall. EXAM: LEFT HUMERUS - 2+ VIEW COMPARISON:  None Available. FINDINGS: An area of cortical irregularity of indeterminate age is seen along the greater  tubercle of the left humeral head. There is no evidence of dislocation. Soft tissues are unremarkable. IMPRESSION: Findings which may represent a fracture deformity of indeterminate age along the greater tubercle of the left humeral head. Correlation with physical examination is recommended to exclude the presence of point tenderness. Electronically Signed   By: Virgina Norfolk M.D.   On: 05/15/2022 01:15   DG Shoulder Left  Result Date: 05/15/2022 CLINICAL DATA:  Status post fall. EXAM: LEFT SHOULDER - 2+ VIEW COMPARISON:  None Available. FINDINGS: A cortical deformity of indeterminate age is seen involving the greater tubercle of the left humeral head. There is no evidence of dislocation. Mild to moderate severity degenerative changes are seen involving the left acromioclavicular joint and left glenohumeral articulation. Soft tissues are  unremarkable. IMPRESSION: Findings which may represent a fracture deformity of indeterminate age involving the greater tubercle of the left humeral head. Correlation with physical examination is recommended to determine the presence of point tenderness. Electronically Signed   By: Virgina Norfolk M.D.   On: 05/15/2022 01:14   DG Knee 1-2 Views Left  Result Date: 05/14/2022 CLINICAL DATA:  144615 Pain 144615 EXAM: RIGHT KNEE - 2 VIEW; LEFT KNEE - 2 VIEW COMPARISON:  None Available. FINDINGS: No evidence of fracture, dislocation, or joint effusion. There is bilateral tricompartmental joint space narrowing, sclerosis and osteophytes consistent with degenerative joint disease. Small suprapatellar effusions. IMPRESSION: Degenerative changes. Effusions.  No acute osseous abnormalities Electronically Signed   By: Sammie Bench M.D.   On: 05/14/2022 21:50   DG Knee 1-2 Views Right  Result Date: 05/14/2022 CLINICAL DATA:  144615 Pain 144615 EXAM: RIGHT KNEE - 2 VIEW; LEFT KNEE - 2 VIEW COMPARISON:  None Available. FINDINGS: No evidence of fracture, dislocation, or joint effusion. There is bilateral tricompartmental joint space narrowing, sclerosis and osteophytes consistent with degenerative joint disease. Small suprapatellar effusions. IMPRESSION: Degenerative changes. Effusions.  No acute osseous abnormalities Electronically Signed   By: Sammie Bench M.D.   On: 05/14/2022 21:50   CT Head Wo Contrast  Addendum Date: 05/14/2022   ADDENDUM REPORT: 05/14/2022 21:40 ADDENDUM: These results were called by telephone at the time of interpretation on 05/14/2022 at 9:40 pm to provider [facility] , who verbally acknowledged these results. Electronically Signed   By: Fidela Salisbury M.D.   On: 05/14/2022 21:40   Result Date: 05/14/2022 CLINICAL DATA:  Head trauma, minor (Age >= 65y) Mental status change, unknown cause EXAM: CT HEAD WITHOUT CONTRAST TECHNIQUE: Contiguous axial images were obtained from the base of  the skull through the vertex without intravenous contrast. RADIATION DOSE REDUCTION: This exam was performed according to the departmental dose-optimization program which includes automated exposure control, adjustment of the mA and/or kV according to patient size and/or use of iterative reconstruction technique. COMPARISON:  02/17/2022 FINDINGS: Brain: Small right subdural hematoma noted along the right frontal convexity measuring 7 mm in thickness with mild mass effect upon the subjacent right frontal cortical gyri. No midline shift. Parenchymal volume loss is commensurate with the patient's age. Stable mild periventricular white matter changes are present likely reflecting the sequela of small vessel ischemia. No acute infarct. Ventricular size is normal. Cerebellum unremarkable. Vascular: No asymmetric hyperdense vasculature at the skull base. Skull: Intact Sinuses/Orbits: Paranasal sinuses are clear. Orbits are unremarkable. Other: Mastoid air cells and middle ear cavities are clear. IMPRESSION: 1. Small right frontal subdural hematoma measuring 7 mm in thickness with mild mass effect upon the subjacent right frontal cortical gyri. No  midline shift. 2. Stable mild senescent change. Electronically Signed: By: Fidela Salisbury M.D. On: 05/14/2022 21:34    Microbiology: Results for orders placed or performed during the hospital encounter of 06/06/22  MRSA Next Gen by PCR, Nasal     Status: None   Collection Time: 06/06/22  2:50 PM   Specimen: Nasal Mucosa; Nasal Swab  Result Value Ref Range Status   MRSA by PCR Next Gen NOT DETECTED NOT DETECTED Final    Comment: (NOTE) The GeneXpert MRSA Assay (FDA approved for NASAL specimens only), is one component of a comprehensive MRSA colonization surveillance program. It is not intended to diagnose MRSA infection nor to guide or monitor treatment for MRSA infections. Test performance is not FDA approved in patients less than 42 years old. Performed at [facility], [place] [address], [place], [place] [address]     Labs: CBC: Recent Labs  Lab 06/06/22 1004 06/06/22 2218 06/08/22 0243 06/09/22 0417 06/10/22 0335 06/11/22 0231 06/11/22 1835 06/12/22 0132  WBC 4.3   < > 4.8 3.5* 4.4 3.8*  --  4.1  NEUTROABS 2.7  --   --   --  2.6  --   --   --   HGB 6.3*   < > 8.9* 7.9* 8.2* 7.4* 7.1* 7.1*  HCT 20.5*   < > 27.5* 24.0* 25.2* 22.7* 22.0* 21.8*  MCV 99.5   < > 92.0 92.0 93.3 94.6  --  94.8  PLT 228   < > 175 162 173 184  --  187   < > = values in this interval not displayed.   Basic Metabolic Panel: Recent Labs  Lab 06/07/22 0520 06/08/22 0243 06/09/22 0417 06/11/22 0231 06/12/22 0132  NA 141 133* 135 136 136  K 3.7 3.9 3.9 3.6 3.5  CL 110 101 101 102 105  CO2 25 22 28 26 26   GLUCOSE 112* 345* 96 137* 113*  BUN 45* 32* 34* 42* 41*  CREATININE 1.35* 1.05 1.13 1.08 1.07  CALCIUM 8.3* 8.1* 8.2* 7.8* 8.1*   Liver Function Tests: No results for input(s): "AST", "ALT", "ALKPHOS", "BILITOT", "PROT", "ALBUMIN" in the last 168 hours. CBG: Recent Labs  Lab 06/11/22 1210 06/11/22 1709 06/11/22 2129 06/12/22 0749 06/12/22 1116  GLUCAP 318* 110* 210* 114* 284*    Discharge time spent: greater than 30 minutes.  Signed: Erin Hearing, NP [facility] 06/12/2022

## 2022-06-17 ENCOUNTER — Ambulatory Visit (INDEPENDENT_AMBULATORY_CARE_PROVIDER_SITE_OTHER): Payer: 59

## 2022-06-17 ENCOUNTER — Ambulatory Visit (INDEPENDENT_AMBULATORY_CARE_PROVIDER_SITE_OTHER): Payer: 59 | Admitting: Podiatry

## 2022-06-17 DIAGNOSIS — Z9889 Other specified postprocedural states: Secondary | ICD-10-CM

## 2022-06-17 DIAGNOSIS — M86062 Acute hematogenous osteomyelitis, left tibia and fibula: Secondary | ICD-10-CM

## 2022-06-17 DIAGNOSIS — M869 Osteomyelitis, unspecified: Secondary | ICD-10-CM

## 2022-06-18 DIAGNOSIS — S42302A Unspecified fracture of shaft of humerus, left arm, initial encounter for closed fracture: Secondary | ICD-10-CM

## 2022-06-18 NOTE — Progress Notes (Signed)
Error

## 2022-06-19 ENCOUNTER — Ambulatory Visit: Payer: 59

## 2022-06-19 ENCOUNTER — Ambulatory Visit: Payer: 59 | Admitting: Oncology

## 2022-06-19 ENCOUNTER — Other Ambulatory Visit: Payer: 59

## 2022-06-22 ENCOUNTER — Encounter: Payer: 59 | Admitting: Podiatry

## 2022-06-26 ENCOUNTER — Ambulatory Visit: Payer: 59 | Admitting: Oncology

## 2022-06-26 ENCOUNTER — Ambulatory Visit: Payer: 59

## 2022-06-26 ENCOUNTER — Other Ambulatory Visit: Payer: 59

## 2022-06-28 ENCOUNTER — Other Ambulatory Visit: Payer: Self-pay | Admitting: Oncology

## 2022-06-30 ENCOUNTER — Other Ambulatory Visit: Payer: Self-pay

## 2022-07-03 ENCOUNTER — Other Ambulatory Visit: Payer: 59

## 2022-07-03 ENCOUNTER — Ambulatory Visit: Payer: 59 | Admitting: Oncology

## 2022-07-03 ENCOUNTER — Ambulatory Visit: Payer: 59

## 2022-07-07 ENCOUNTER — Encounter: Payer: Self-pay | Admitting: Podiatry

## 2022-07-07 ENCOUNTER — Ambulatory Visit (INDEPENDENT_AMBULATORY_CARE_PROVIDER_SITE_OTHER): Payer: 59 | Admitting: Podiatry

## 2022-07-07 VITALS — BP 87/58 | HR 90

## 2022-07-07 DIAGNOSIS — Z9889 Other specified postprocedural states: Secondary | ICD-10-CM

## 2022-07-07 NOTE — Progress Notes (Signed)
   Chief Complaint  Patient presents with   Routine Post Op    "My foot is doing alright."    Subjective:  Patient presents today status post right great toe amputation and partial second toe amputation performed inpatient 04/25/2022.  Patient states that today he does not feel well.  He believes the amputation sites have healed completely and he is doing well with his foot but general malaise and fatigue today with loss of appetite.  He says that his girlfriend called EMT today who evaluated him at his home and did not recommend taking him to the hospital.  Past Medical History:  Diagnosis Date   Acute metabolic encephalopathy 12/02/2020   DKA (diabetic ketoacidoses) 04/06/2016   Hypercholesteremia    Hypertension     Past Surgical History:  Procedure Laterality Date   AMPUTATION TOE Left 04/25/2022   Procedure: LEFT GREAT TOE AMPUTATION AND LEFT PARTIAL 2ND TOE AMPUTATION;  Surgeon: Felecia Shelling, DPM;  Location: [facility] ORS;  Service: Podiatry;  Laterality: Left;   COLONOSCOPY WITH PROPOFOL N/A 02/01/2020   Procedure: COLONOSCOPY WITH PROPOFOL;  Surgeon: Toney Reil, MD;  Location: [facility] ENDOSCOPY;  Service: Gastroenterology;  Laterality: N/A;   ESOPHAGOGASTRODUODENOSCOPY  02/01/2020   Procedure: ESOPHAGOGASTRODUODENOSCOPY (EGD);  Surgeon: Toney Reil, MD;  Location: [facility] ENDOSCOPY;  Service: Gastroenterology;;   ESOPHAGOGASTRODUODENOSCOPY (EGD) WITH PROPOFOL N/A 06/07/2022   Procedure: ESOPHAGOGASTRODUODENOSCOPY (EGD) WITH PROPOFOL;  Surgeon: Kerin Salen, MD;  Location: [facility] ENDOSCOPY;  Service: Gastroenterology;  Laterality: N/A;   KNEE SURGERY Right    Torn meniscus   KNEE SURGERY Left     Allergies  Allergen Reactions   Penicillins Other (See Comments)    Childhood allergy -tolerated amoxil 03/2022 Unknown reaction Has patient had a PCN reaction causing immediate rash, facial/tongue/throat swelling, SOB or lightheadedness with hypotension: No Has patient had a PCN  reaction causing severe rash involving mucus membranes or skin necrosis: No Has patient had a PCN reaction that required hospitalization No Has patient had a PCN reaction occurring within the last 10 years: No If all of the above answers are "NO", then may proceed with Cephalosporin use.     Objective/Physical Exam Amputation site is healed.  Complete healing of the amputation sites.  No new wounds or concern from a lower extremity standpoint  Patient hypotensive with tachycardia.  Today's Vitals   07/07/22 1715  BP: (!) 87/58  Pulse: 90   There is no height or weight on file to calculate BMI.   Assessment/plan of care: 1. s/p left great toe and partial second toe amputation. DOS: 04/25/2022 - Amputation site is completely healed. -Discontinue cam boot.  Recommend good supportive tennis shoes and sneakers.  Advised against going barefoot  2.  Generalized malaise.  Hypotensive with tachycardia -Recommend going to the emergency department for full evaluation and workup.  Patient understands and states that he will go to the hospital to be evaluated thoroughly  -Return to clinic as needed  Felecia Shelling, DPM [facility]  Dr. Felecia Shelling, DPM    [address] [place], [place] [address]                Office [phone]  Fax [phone]

## 2022-07-09 ENCOUNTER — Emergency Department: Payer: 59

## 2022-07-09 ENCOUNTER — Encounter: Payer: Self-pay | Admitting: Internal Medicine

## 2022-07-09 ENCOUNTER — Other Ambulatory Visit: Payer: Self-pay

## 2022-07-09 ENCOUNTER — Inpatient Hospital Stay
Admission: EM | Admit: 2022-07-09 | Discharge: 2022-07-11 | DRG: 638 | Disposition: A | Discharge status: Home Health | Payer: 59 | Attending: Internal Medicine | Admitting: Internal Medicine

## 2022-07-09 DIAGNOSIS — E1310 Other specified diabetes mellitus with ketoacidosis without coma: Secondary | ICD-10-CM | POA: Diagnosis not present

## 2022-07-09 DIAGNOSIS — Z880 Allergy status to penicillin: Secondary | ICD-10-CM | POA: Diagnosis not present

## 2022-07-09 DIAGNOSIS — Z794 Long term (current) use of insulin: Secondary | ICD-10-CM | POA: Diagnosis not present

## 2022-07-09 DIAGNOSIS — Z7952 Long term (current) use of systemic steroids: Secondary | ICD-10-CM | POA: Diagnosis not present

## 2022-07-09 DIAGNOSIS — Z8673 Personal history of transient ischemic attack (TIA), and cerebral infarction without residual deficits: Secondary | ICD-10-CM

## 2022-07-09 DIAGNOSIS — E7800 Pure hypercholesterolemia, unspecified: Secondary | ICD-10-CM | POA: Diagnosis present

## 2022-07-09 DIAGNOSIS — N179 Acute kidney failure, unspecified: Secondary | ICD-10-CM | POA: Diagnosis present

## 2022-07-09 DIAGNOSIS — Z89412 Acquired absence of left great toe: Secondary | ICD-10-CM | POA: Diagnosis not present

## 2022-07-09 DIAGNOSIS — I951 Orthostatic hypotension: Secondary | ICD-10-CM | POA: Diagnosis present

## 2022-07-09 DIAGNOSIS — K279 Peptic ulcer, site unspecified, unspecified as acute or chronic, without hemorrhage or perforation: Secondary | ICD-10-CM | POA: Diagnosis not present

## 2022-07-09 DIAGNOSIS — R739 Hyperglycemia, unspecified: Secondary | ICD-10-CM | POA: Diagnosis present

## 2022-07-09 DIAGNOSIS — N1831 Chronic kidney disease, stage 3a: Secondary | ICD-10-CM | POA: Diagnosis present

## 2022-07-09 DIAGNOSIS — Z7982 Long term (current) use of aspirin: Secondary | ICD-10-CM

## 2022-07-09 DIAGNOSIS — E1022 Type 1 diabetes mellitus with diabetic chronic kidney disease: Secondary | ICD-10-CM | POA: Diagnosis present

## 2022-07-09 DIAGNOSIS — Z8249 Family history of ischemic heart disease and other diseases of the circulatory system: Secondary | ICD-10-CM | POA: Diagnosis not present

## 2022-07-09 DIAGNOSIS — F1410 Cocaine abuse, uncomplicated: Secondary | ICD-10-CM | POA: Diagnosis present

## 2022-07-09 DIAGNOSIS — I10 Essential (primary) hypertension: Secondary | ICD-10-CM | POA: Diagnosis present

## 2022-07-09 DIAGNOSIS — Z91199 Patient's noncompliance with other medical treatment and regimen due to unspecified reason: Secondary | ICD-10-CM

## 2022-07-09 DIAGNOSIS — Z8719 Personal history of other diseases of the digestive system: Secondary | ICD-10-CM

## 2022-07-09 DIAGNOSIS — K3189 Other diseases of stomach and duodenum: Secondary | ICD-10-CM | POA: Diagnosis present

## 2022-07-09 DIAGNOSIS — F1910 Other psychoactive substance abuse, uncomplicated: Secondary | ICD-10-CM | POA: Diagnosis not present

## 2022-07-09 DIAGNOSIS — Z7902 Long term (current) use of antithrombotics/antiplatelets: Secondary | ICD-10-CM | POA: Diagnosis not present

## 2022-07-09 DIAGNOSIS — E860 Dehydration: Secondary | ICD-10-CM | POA: Diagnosis present

## 2022-07-09 DIAGNOSIS — Z8579 Personal history of other malignant neoplasms of lymphoid, hematopoietic and related tissues: Secondary | ICD-10-CM | POA: Diagnosis not present

## 2022-07-09 DIAGNOSIS — E785 Hyperlipidemia, unspecified: Secondary | ICD-10-CM | POA: Diagnosis present

## 2022-07-09 DIAGNOSIS — E1010 Type 1 diabetes mellitus with ketoacidosis without coma: Principal | ICD-10-CM | POA: Diagnosis present

## 2022-07-09 DIAGNOSIS — I129 Hypertensive chronic kidney disease with stage 1 through stage 4 chronic kidney disease, or unspecified chronic kidney disease: Secondary | ICD-10-CM | POA: Diagnosis present

## 2022-07-09 DIAGNOSIS — E1110 Type 2 diabetes mellitus with ketoacidosis without coma: Secondary | ICD-10-CM | POA: Diagnosis present

## 2022-07-09 DIAGNOSIS — Z79899 Other long term (current) drug therapy: Secondary | ICD-10-CM

## 2022-07-09 DIAGNOSIS — Z1152 Encounter for screening for COVID-19: Secondary | ICD-10-CM

## 2022-07-09 DIAGNOSIS — Z7984 Long term (current) use of oral hypoglycemic drugs: Secondary | ICD-10-CM

## 2022-07-09 DIAGNOSIS — I959 Hypotension, unspecified: Secondary | ICD-10-CM | POA: Diagnosis present

## 2022-07-09 DIAGNOSIS — R531 Weakness: Secondary | ICD-10-CM | POA: Diagnosis not present

## 2022-07-09 LAB — CBG MONITORING, ED
Glucose-Capillary: >600 mg/dL (ref 70–99)
Glucose-Capillary: >600 mg/dL (ref 70–99)
Glucose-Capillary: 552 mg/dL (ref 70–99)
Glucose-Capillary: 519 mg/dL (ref 70–99)
Glucose-Capillary: 492 mg/dL — ABNORMAL HIGH (ref 70–99)
Glucose-Capillary: 397 mg/dL — ABNORMAL HIGH (ref 70–99)
Glucose-Capillary: 374 mg/dL — ABNORMAL HIGH (ref 70–99)
Glucose-Capillary: 239 mg/dL — ABNORMAL HIGH (ref 70–99)
Glucose-Capillary: 182 mg/dL — ABNORMAL HIGH (ref 70–99)

## 2022-07-09 LAB — CBC
HCT: 30.3 % — ABNORMAL LOW (ref 39.0–52.0)
Hemoglobin: 9.6 g/dL — ABNORMAL LOW (ref 13.0–17.0)
MCH: 30.9 pg (ref 26.0–34.0)
MCHC: 31.7 g/dL (ref 30.0–36.0)
MCV: 97.4 fL (ref 80.0–100.0)
Platelets: 226 10*3/uL (ref 150–400)
RBC: 3.11 MIL/uL — ABNORMAL LOW (ref 4.22–5.81)
RDW: 16.5 % — ABNORMAL HIGH (ref 11.5–15.5)
WBC: 6 10*3/uL (ref 4.0–10.5)
nRBC: 0 % (ref 0.0–0.2)

## 2022-07-09 LAB — BASIC METABOLIC PANEL
Anion gap: 11 (ref 5–15)
Anion gap: 9 (ref 5–15)
Anion gap: 7 (ref 5–15)
BUN: 78 mg/dL — ABNORMAL HIGH (ref 8–23)
BUN: 73 mg/dL — ABNORMAL HIGH (ref 8–23)
BUN: 71 mg/dL — ABNORMAL HIGH (ref 8–23)
CO2: 19 mmol/L — ABNORMAL LOW (ref 22–32)
CO2: 23 mmol/L (ref 22–32)
CO2: 24 mmol/L (ref 22–32)
Calcium: 8.8 mg/dL — ABNORMAL LOW (ref 8.9–10.3)
Calcium: 9.1 mg/dL (ref 8.9–10.3)
Calcium: 8.9 mg/dL (ref 8.9–10.3)
Chloride: 101 mmol/L (ref 98–111)
Chloride: 104 mmol/L (ref 98–111)
Chloride: 103 mmol/L (ref 98–111)
Creatinine, Ser: 2.55 mg/dL — ABNORMAL HIGH (ref 0.61–1.24)
Creatinine, Ser: 2.24 mg/dL — ABNORMAL HIGH (ref 0.61–1.24)
Creatinine, Ser: 1.86 mg/dL — ABNORMAL HIGH (ref 0.61–1.24)
GFR, Estimated: 26 mL/min — ABNORMAL LOW (ref >60)
GFR, Estimated: 31 mL/min — ABNORMAL LOW (ref >60)
GFR, Estimated: 39 mL/min — ABNORMAL LOW (ref >60)
Glucose, Bld: 573 mg/dL (ref 70–99)
Glucose, Bld: 263 mg/dL — ABNORMAL HIGH (ref 70–99)
Glucose, Bld: 101 mg/dL — ABNORMAL HIGH (ref 70–99)
Potassium: 4.7 mmol/L (ref 3.5–5.1)
Potassium: 4.4 mmol/L (ref 3.5–5.1)
Potassium: 4.5 mmol/L (ref 3.5–5.1)
Sodium: 131 mmol/L — ABNORMAL LOW (ref 135–145)
Sodium: 136 mmol/L (ref 135–145)
Sodium: 134 mmol/L — ABNORMAL LOW (ref 135–145)

## 2022-07-09 LAB — CBC WITH DIFFERENTIAL/PLATELET
Abs Immature Granulocytes: 0.01 10*3/uL (ref 0.00–0.07)
Basophils Absolute: 0 10*3/uL (ref 0.0–0.1)
Basophils Relative: 0 %
Eosinophils Absolute: 0 10*3/uL (ref 0.0–0.5)
Eosinophils Relative: 1 %
HCT: 33.4 % — ABNORMAL LOW (ref 39.0–52.0)
Hemoglobin: 10.3 g/dL — ABNORMAL LOW (ref 13.0–17.0)
Immature Granulocytes: 0 %
Lymphocytes Relative: 26 %
Lymphs Abs: 1.3 10*3/uL (ref 0.7–4.0)
MCH: 30.6 pg (ref 26.0–34.0)
MCHC: 30.8 g/dL (ref 30.0–36.0)
MCV: 99.1 fL (ref 80.0–100.0)
Monocytes Absolute: 0.3 10*3/uL (ref 0.1–1.0)
Monocytes Relative: 6 %
Neutro Abs: 3.4 10*3/uL (ref 1.7–7.7)
Neutrophils Relative %: 67 %
Platelets: 237 10*3/uL (ref 150–400)
RBC: 3.37 MIL/uL — ABNORMAL LOW (ref 4.22–5.81)
RDW: 16.2 % — ABNORMAL HIGH (ref 11.5–15.5)
WBC: 5.1 10*3/uL (ref 4.0–10.5)
nRBC: 0 % (ref 0.0–0.2)

## 2022-07-09 LAB — COMPREHENSIVE METABOLIC PANEL
ALT: 28 U/L (ref 0–44)
AST: 26 U/L (ref 15–41)
Albumin: 3.5 g/dL (ref 3.5–5.0)
Alkaline Phosphatase: 64 U/L (ref 38–126)
Anion gap: 16 — ABNORMAL HIGH (ref 5–15)
BUN: 79 mg/dL — ABNORMAL HIGH (ref 8–23)
CO2: 18 mmol/L — ABNORMAL LOW (ref 22–32)
Calcium: 9 mg/dL (ref 8.9–10.3)
Chloride: 96 mmol/L — ABNORMAL LOW (ref 98–111)
Creatinine, Ser: 2.7 mg/dL — ABNORMAL HIGH (ref 0.61–1.24)
GFR, Estimated: 25 mL/min — ABNORMAL LOW (ref >60)
Glucose, Bld: 730 mg/dL (ref 70–99)
Potassium: 6.2 mmol/L — ABNORMAL HIGH (ref 3.5–5.1)
Sodium: 130 mmol/L — ABNORMAL LOW (ref 135–145)
Total Bilirubin: 1.1 mg/dL (ref 0.3–1.2)
Total Protein: 9.1 g/dL — ABNORMAL HIGH (ref 6.5–8.1)

## 2022-07-09 LAB — URINE DRUG SCREEN, QUALITATIVE (ARMC ONLY)
Amphetamines, Ur Screen: NOT DETECTED
Barbiturates, Ur Screen: NOT DETECTED
Benzodiazepine, Ur Scrn: NOT DETECTED
Cannabinoid 50 Ng, Ur ~~LOC~~: NOT DETECTED
Cocaine Metabolite,Ur ~~LOC~~: POSITIVE — AB
MDMA (Ecstasy)Ur Screen: NOT DETECTED
Methadone Scn, Ur: NOT DETECTED
Opiate, Ur Screen: NOT DETECTED
Phencyclidine (PCP) Ur S: NOT DETECTED
Tricyclic, Ur Screen: NOT DETECTED

## 2022-07-09 LAB — RESP PANEL BY RT-PCR (RSV, FLU A&B, COVID)  RVPGX2
Influenza A by PCR: NEGATIVE
Influenza B by PCR: NEGATIVE
Resp Syncytial Virus by PCR: NEGATIVE
SARS Coronavirus 2 by RT PCR: NEGATIVE

## 2022-07-09 LAB — GLUCOSE, CAPILLARY
Glucose-Capillary: 154 mg/dL — ABNORMAL HIGH (ref 70–99)
Glucose-Capillary: 103 mg/dL — ABNORMAL HIGH (ref 70–99)
Glucose-Capillary: 124 mg/dL — ABNORMAL HIGH (ref 70–99)
Glucose-Capillary: 92 mg/dL (ref 70–99)
Glucose-Capillary: 145 mg/dL — ABNORMAL HIGH (ref 70–99)
Glucose-Capillary: 148 mg/dL — ABNORMAL HIGH (ref 70–99)
Glucose-Capillary: 114 mg/dL — ABNORMAL HIGH (ref 70–99)
Glucose-Capillary: 115 mg/dL — ABNORMAL HIGH (ref 70–99)

## 2022-07-09 LAB — MRSA NEXT GEN BY PCR, NASAL: MRSA by PCR Next Gen: DETECTED — AB

## 2022-07-09 LAB — URINALYSIS, ROUTINE W REFLEX MICROSCOPIC
Bacteria, UA: NONE SEEN
Bilirubin Urine: NEGATIVE
Glucose, UA: >=500 mg/dL — AB
Hgb urine dipstick: NEGATIVE
Ketones, ur: 20 mg/dL — AB
Leukocytes,Ua: NEGATIVE
Nitrite: NEGATIVE
Protein, ur: NEGATIVE mg/dL
Specific Gravity, Urine: 1.02 (ref 1.005–1.030)
Squamous Epithelial / HPF: NONE SEEN /HPF (ref 0–5)
pH: 5 (ref 5.0–8.0)

## 2022-07-09 LAB — BLOOD GAS, VENOUS
Acid-base deficit: 9 mmol/L — ABNORMAL HIGH (ref 0.0–2.0)
Bicarbonate: 18 mmol/L — ABNORMAL LOW (ref 20.0–28.0)
O2 Saturation: 36.5 %
Patient temperature: 37
pCO2, Ven: 42 mmHg — ABNORMAL LOW (ref 44–60)
pH, Ven: 7.24 — ABNORMAL LOW (ref 7.25–7.43)
pO2, Ven: <31 mmHg — CL (ref 32–45)

## 2022-07-09 LAB — BETA-HYDROXYBUTYRIC ACID
Beta-Hydroxybutyric Acid: 4.81 mmol/L — ABNORMAL HIGH (ref 0.05–0.27)
Beta-Hydroxybutyric Acid: 3.9 mmol/L — ABNORMAL HIGH (ref 0.05–0.27)
Beta-Hydroxybutyric Acid: 0.13 mmol/L (ref 0.05–0.27)

## 2022-07-09 LAB — LACTIC ACID, PLASMA: Lactic Acid, Venous: 2.4 mmol/L (ref 0.5–1.9)

## 2022-07-09 LAB — MAGNESIUM: Magnesium: 2.5 mg/dL — ABNORMAL HIGH (ref 1.7–2.4)

## 2022-07-09 LAB — CREATININE, URINE, RANDOM: Creatinine, Urine: 37 mg/dL

## 2022-07-09 LAB — SODIUM, URINE, RANDOM: Sodium, Ur: 55 mmol/L

## 2022-07-09 MED ORDER — LACTATED RINGERS IV BOLUS
1000.0000 mL | Freq: Once | INTRAVENOUS | Status: AC
  Administered 2022-07-09: 1000 mL via INTRAVENOUS

## 2022-07-09 MED ORDER — ALBUTEROL SULFATE (2.5 MG/3ML) 0.083% IN NEBU: 2.5000 mg | INHALATION_SOLUTION | RESPIRATORY_TRACT | Status: AC | PRN

## 2022-07-09 MED ORDER — DEXTROSE 50 % IV SOLN: 0.0000 mL | INTRAVENOUS | Status: DC | PRN

## 2022-07-09 MED ORDER — INSULIN REGULAR(HUMAN) IN NACL 100-0.9 UT/100ML-% IV SOLN
INTRAVENOUS | Status: DC
  Administered 2022-07-09: 6.5 [IU]/h via INTRAVENOUS

## 2022-07-09 MED ORDER — INSULIN ASPART 100 UNIT/ML IJ SOLN
0.0000 [IU] | Freq: Three times a day (TID) | INTRAMUSCULAR | Status: DC
  Administered 2022-07-10: 2 [IU] via SUBCUTANEOUS
  Filled 2022-07-09: qty 1

## 2022-07-09 MED ORDER — ONDANSETRON HCL 4 MG/2ML IJ SOLN: 4.0000 mg | Freq: Three times a day (TID) | INTRAMUSCULAR | Status: AC | PRN

## 2022-07-09 MED ORDER — DEXTROSE IN LACTATED RINGERS 5 % IV SOLN: INTRAVENOUS | Status: DC

## 2022-07-09 MED ORDER — INSULIN ASPART 100 UNIT/ML IJ SOLN: 0.0000 [IU] | Freq: Every day | INTRAMUSCULAR | Status: DC

## 2022-07-09 MED ORDER — LACTATED RINGERS IV SOLN: INTRAVENOUS | Status: DC

## 2022-07-09 MED ORDER — INSULIN REGULAR(HUMAN) IN NACL 100-0.9 UT/100ML-% IV SOLN
INTRAVENOUS | Status: DC
  Administered 2022-07-09: 6 [IU]/h via INTRAVENOUS
  Filled 2022-07-09: qty 100

## 2022-07-09 MED ORDER — ATORVASTATIN CALCIUM 20 MG PO TABS
80.0000 mg | ORAL_TABLET | Freq: Every day | ORAL | Status: DC
  Administered 2022-07-09 – 2022-07-10 (×2): 80 mg via ORAL
  Filled 2022-07-09 (×2): qty 4

## 2022-07-09 MED ORDER — ENOXAPARIN SODIUM 30 MG/0.3ML IJ SOSY
30.0000 mg | PREFILLED_SYRINGE | INTRAMUSCULAR | Status: DC
  Administered 2022-07-09: 30 mg via SUBCUTANEOUS
  Filled 2022-07-09: qty 0.3

## 2022-07-09 MED ORDER — INSULIN ASPART 100 UNIT/ML IJ SOLN
3.0000 [IU] | Freq: Three times a day (TID) | INTRAMUSCULAR | Status: DC
  Administered 2022-07-10 (×2): 3 [IU] via SUBCUTANEOUS
  Filled 2022-07-09 (×2): qty 1

## 2022-07-09 MED ORDER — DEXTROSE 50 % IV SOLN
0.0000 mL | INTRAVENOUS | Status: DC | PRN
  Administered 2022-07-11: 50 mL via INTRAVENOUS
  Filled 2022-07-09 (×3): qty 50

## 2022-07-09 MED ORDER — INSULIN DETEMIR 100 UNIT/ML ~~LOC~~ SOLN
0.3000 [IU]/kg | SUBCUTANEOUS | Status: DC
  Administered 2022-07-09 – 2022-07-10 (×2): 19 [IU] via SUBCUTANEOUS
  Filled 2022-07-09 (×3): qty 0.19

## 2022-07-09 MED ORDER — CLOPIDOGREL BISULFATE 75 MG PO TABS
75.0000 mg | ORAL_TABLET | Freq: Every morning | ORAL | Status: DC
  Administered 2022-07-09 – 2022-07-11 (×3): 75 mg via ORAL
  Filled 2022-07-09 (×3): qty 1

## 2022-07-09 MED ORDER — CHLORHEXIDINE GLUCONATE CLOTH 2 % EX PADS
6.0000 | MEDICATED_PAD | Freq: Every day | CUTANEOUS | Status: DC
  Administered 2022-07-09 – 2022-07-11 (×3): 6 via TOPICAL

## 2022-07-09 MED ORDER — HYDROMORPHONE HCL 1 MG/ML IJ SOLN: 0.5000 mg | INTRAMUSCULAR | Status: AC | PRN

## 2022-07-09 MED ORDER — FLUDROCORTISONE ACETATE 0.1 MG PO TABS
0.2000 mg | ORAL_TABLET | Freq: Every day | ORAL | Status: DC
  Administered 2022-07-09 – 2022-07-10 (×2): 0.2 mg via ORAL
  Filled 2022-07-09 (×2): qty 2

## 2022-07-09 MED ORDER — ASPIRIN 81 MG PO TBEC
81.0000 mg | DELAYED_RELEASE_TABLET | Freq: Every morning | ORAL | Status: DC
  Administered 2022-07-09 – 2022-07-11 (×3): 81 mg via ORAL
  Filled 2022-07-09 (×3): qty 1

## 2022-07-09 MED ORDER — PANTOPRAZOLE SODIUM 40 MG PO TBEC
40.0000 mg | DELAYED_RELEASE_TABLET | Freq: Two times a day (BID) | ORAL | Status: DC
  Administered 2022-07-09 – 2022-07-11 (×5): 40 mg via ORAL
  Filled 2022-07-09 (×5): qty 1

## 2022-07-09 NOTE — Assessment & Plan Note (Signed)
Cont atorvastatin  

## 2022-07-09 NOTE — ED Provider Notes (Signed)
[facility] Provider Note    Event Date/Time   First MD Initiated Contact with Patient 07/09/22 0250     (approximate)   History   Hyperglycemia   HPI  Nicholas Weiss is a 72 y.o. male who presents to the ED for evaluation of Hyperglycemia   I review a PCP visit from 3/28.  Was following up after a hospitalization at [facility] from 3/9 - 3/15 for DKA and GI bleeding.  He got 3 units of PRBCs.  Erosive gastropathy on EGD and started on PPI.  Continued on aspirin and Plavix due to history of stroke.  Patient presents to the ED for evaluation of generalized weakness and hyperglycemia.  No particular complaints and no pain beyond generalized weakness.  He reports compliance with his medications.  No falls, syncope or injuries.  Physical Exam   Triage Vital Signs: ED Triage Vitals [07/09/22 0249]  Enc Vitals Group     BP      Pulse      Resp      Temp      Temp src      SpO2 98 %     Weight      Height      Head Circumference      Peak Flow      Pain Score      Pain Loc      Pain Edu?      Excl. in GC?     Most recent vital signs: Vitals:   07/09/22 0252 07/09/22 0415  BP: 122/74 (!) 149/129  Pulse: 93 98  Resp: 15 20  Temp: 97.9 F (36.6 C)   SpO2: 100% 100%    General: Awake, no distress.  Seems dry.   CV:  Good peripheral perfusion.  Resp:  Normal effort.  Abd:  No distention.  Soft and benign MSK:  No deformity noted.  Neuro:  No focal deficits appreciated. Other:     ED Results / Procedures / Treatments   Labs (all labs ordered are listed, but only abnormal results are displayed) Labs Reviewed  BLOOD GAS, VENOUS - Abnormal; Notable for the following components:      Result Value   pH, Ven 7.24 (*)    pCO2, Ven 42 (*)    pO2, Ven <31 (*)    Bicarbonate 18.0 (*)    Acid-base deficit 9.0 (*)    All other components within normal limits  BETA-HYDROXYBUTYRIC ACID - Abnormal; Notable for the following components:    Beta-Hydroxybutyric Acid 4.81 (*)    All other components within normal limits  COMPREHENSIVE METABOLIC PANEL - Abnormal; Notable for the following components:   Sodium 130 (*)    Potassium 6.2 (*)    Chloride 96 (*)    CO2 18 (*)    Glucose, Bld 730 (*)    BUN 79 (*)    Creatinine, Ser 2.70 (*)    Total Protein 9.1 (*)    GFR, Estimated 25 (*)    Anion gap 16 (*)    All other components within normal limits  CBC WITH DIFFERENTIAL/PLATELET - Abnormal; Notable for the following components:   RBC 3.37 (*)    Hemoglobin 10.3 (*)    HCT 33.4 (*)    RDW 16.2 (*)    All other components within normal limits  URINALYSIS, ROUTINE W REFLEX MICROSCOPIC - Abnormal; Notable for the following components:   Color, Urine STRAW (*)    APPearance CLEAR (*)  Glucose, UA >=500 (*)    Ketones, ur 20 (*)    All other components within normal limits  MAGNESIUM - Abnormal; Notable for the following components:   Magnesium 2.5 (*)    All other components within normal limits  CBG MONITORING, ED - Abnormal; Notable for the following components:   Glucose-Capillary >600 (*)    All other components within normal limits  RESP PANEL BY RT-PCR (RSV, FLU A&B, COVID)  [id number]    EKG Sinus versus junctional rhythm with a rate of 94 bpm, rightward axis and no high-grade AV block.  QTc 493.  Large septal T waves.  No STEMI.  RADIOLOGY CXR interpreted by me without evidence of acute cardiopulmonary pathology.  Official radiology report(s): DG Chest Portable 1 View  Result Date: 07/09/2022 CLINICAL DATA:  Weakness EXAM: PORTABLE CHEST 1 VIEW COMPARISON:  04/21/2022 FINDINGS: Cardiac shadow is within normal limits. Lungs are well aerated bilaterally. Skin fold is noted over the right chest. No pneumothorax is seen. An old left ninth rib fracture is noted without complicating factors. IMPRESSION: No acute abnormality noted. Electronically Signed   By: Alcide Clever M.D.   On: 07/09/2022 03:22    PROCEDURES  and INTERVENTIONS:  .1-3 Lead EKG Interpretation  Performed by: Delton Prairie, MD Authorized by: Delton Prairie, MD     Interpretation: normal     ECG rate:  92   ECG rate assessment: normal     Rhythm: sinus rhythm     Ectopy: none     Conduction: normal   .Critical Care  Performed by: Delton Prairie, MD Authorized by: Delton Prairie, MD   Critical care provider statement:    Critical care time (minutes):  30   Critical care time was exclusive of:  Separately billable procedures and treating other patients   Critical care was necessary to treat or prevent imminent or life-threatening deterioration of the following conditions:  Endocrine crisis and metabolic crisis   Critical care was time spent personally by me on the following activities:  Development of treatment plan with patient or surrogate, discussions with consultants, evaluation of patient's response to treatment, examination of patient, ordering and review of laboratory studies, ordering and review of radiographic studies, ordering and performing treatments and interventions, pulse oximetry, re-evaluation of patient's condition and review of old charts   Medications  insulin regular, human (MYXREDLIN) 100 units/ 100 mL infusion (has no administration in time range)  lactated ringers infusion (has no administration in time range)  dextrose 5 % in lactated ringers infusion (has no administration in time range)  dextrose 50 % solution 0-50 mL (has no administration in time range)  lactated ringers bolus 1,000 mL (0 mLs Intravenous Stopped 07/09/22 0417)  lactated ringers bolus 1,000 mL (1,000 mLs Intravenous New Bag/Given 07/09/22 0509)     IMPRESSION / MDM / ASSESSMENT AND PLAN / ED COURSE  I reviewed the triage vital signs and the nursing notes.  Differential diagnosis includes, but is not limited to, DKA, dehydration, AKI, sepsis, viral syndrome  {Patient presents with symptoms of an acute illness or injury that is potentially  life-threatening.  Patient presents to the ED with evidence of DKA of uncertain etiology requiring insulin drip and medical admission.  No clear symptoms of any underlying precipitator.  He has a venous pH of 7.2, hyperglycemic to the 700s and with a potassium of 6.2.  He is provided fluid resuscitation and started on insulin drip.  Urine with ketones but no infectious features.  Negative viral swabs.  Consulted with medicine for admission  Clinical Course as of 07/09/22 0528  Thu Jul 09, 2022  0309 Reassessed. [DS]  0309 Call from RT regarding vbg: pH7.24, co2 42, po<31, bicarb 18 [DS]  0409 Still no metabolic panel result. RN calls lab and they request a re-draw because the machine is not giving them a sodium result?? They are unwilling to publish these results or tell us a potassium value [DS]    Clinical Course User Index [DS] Delton PrairieSmith, Cyara Devoto, MD     FINAL CLINICAL IMPRESSION(S) / ED DIAGNOSES   Final diagnoses:  Type 1 diabetes mellitus with ketoacidosis without coma  AKI (acute kidney injury)  Hyperglycemia  Dehydration     Rx / DC Orders   ED Discharge Orders     None        Note:  This document was prepared using Dragon voice recognition software and may include unintentional dictation errors.   Delton PrairieSmith, Shade Kaley, MD 07/09/22 0530

## 2022-07-09 NOTE — Assessment & Plan Note (Signed)
BP stable  Titrate management w/ orthostatic hypotension on midodrine

## 2022-07-09 NOTE — Inpatient Diabetes Management (Signed)
Inpatient Diabetes Program Recommendations  AACE/ADA: New Consensus Statement on Inpatient Glycemic Control   Target Ranges:  Prepandial:   less than 140 mg/dL      Peak postprandial:   less than 180 mg/dL (1-2 hours)      Critically ill patients:  140 - 180 mg/dL    Latest Reference Range & Units 07/09/22 02:52 07/09/22 06:02 07/09/22 07:13  Glucose-Capillary 70 - 99 mg/dL >937 (HH) >902 (HH) 409 (HH)    Latest Reference Range & Units 07/09/22 02:56  CO2 22 - 32 mmol/L 18 (L)  Glucose 70 - 99 mg/dL 735 (HH)  Anion gap 5 - 15  16 (H)     Latest Reference Range & Units 07/09/22 02:56  Beta-Hydroxybutyric Acid 0.05 - 0.27 mmol/L 4.81 (H)    Review of Glycemic Control  Diabetes history:  DM1 Outpatient Diabetes medications: Tresiba 15 units daily, Novolog 0-9 units TID Current orders for Inpatient glycemic control: IV insulin  Inpatient Diabetes Program Recommendations:    Insulin: Once acidosis has completely cleared and provider is ready to transition from IV to SQ insulin, please consider ordering Levemir 7 units Q24H, CBGs Q4H, Novolog 0-9 units Q4H, and Novolog 2 units TID with meals for meal coverage if patient eats at least 50% of meals.  NOTE: Patient admitted with DKA and AKI. Initial glucose 730 mg/dl and patient started on IV insulin for DKA around 6:05 am today.  Patient has Type 1 DM and is well known to inpatient diabetes team due to multiple hospital admissions with DKA. Patient was last inpatient 06/06/22-06/12/22 and inpatient diabetes spoke with patient on 06/09/22 during that admission. Patient is followed by Dr. Tedd Sias (Endocrinologist) and was last seen by her on 03/16/22. Per office note on 03/16/22, patient was instructed to continue Tresiba 15 units daily and  IF EATING Take 3 unit of NovoLog if your sugar is 80 - 120 Take 5 unit of NovoLog if your sugar is 121-170 Take 7 units of NovoLog if your sugar is 171-220 Take 9 units of NovoLog if your sugar is  221-270 Take 10 units of NovoLog if your sugar is 271 or higher  IF NOT EATING (AND YOU HAVE NOT TAKEN NOVOLOG IN THE LAST 3 HOURS) Take 2 units of NovoLog if your sugar is 201-250 Take 4 units of NovoLog if your sugar is 251-300 Take 6 units of NovoLog if your sugar is 301 or higher   Thanks, Orlando Penner, RN, MSN, CDCES Diabetes Coordinator Inpatient Diabetes Program [phone] (Team Pager from 8am to 5pm)

## 2022-07-09 NOTE — ED Notes (Signed)
ICU called, ICU RN will call back when she is able.

## 2022-07-09 NOTE — Assessment & Plan Note (Signed)
On midodrine  Monitor and titrate in setting of baseline HTN

## 2022-07-09 NOTE — Assessment & Plan Note (Signed)
Recent admission 05/2022 for UGIB on high dose PPI  Hgb stable at 10.3  Cont PPI  Follow

## 2022-07-09 NOTE — H&P (Signed)
History and Physical    Patient: Nicholas Weiss [id number] DOB: 04-16-1950 DOA: 07/09/2022 DOS: the patient was seen and examined on 07/09/2022 PCP: Marisue IvanLinthavong, Kanhka, MD  Patient coming from: Home  Chief Complaint:  Chief Complaint  Patient presents with   Hyperglycemia   HPI: Nicholas Weiss is a [age] y.o. male with medical history significant of multiple medical issues including substance abuse, poorly controlled type 1 diabetes, hypertension, orthostatic hypotension, history of CVA, stage III CKD, multiple myeloma, peptic ulcer disease presenting with DKA and acute on chronic stage III CKD.  Patient with noted multiple recent admissions including admission back in March for GI bleed as well as admission in February for falls and orthostatic hypotension.  Patient reports blood sugars into the 5-600s at home.  Patient denies any recent infections or alcohol use.  Does report that he has been "eating ".  Positive malaise, nausea and abdominal pain.  No chest pain or shortness of breath.  No diarrhea.  No hemiparesis or confusion.  Denies any black or tarry stools.  Has been compliant with high-dose PPI from recent admission.  No reported illicit drug use. Presented to the ER afebrile, hemodynamically stable.  Notable labs include glucose of 730, creatinine 2.7, bicarb of 18, potassium of 6.2.  White count 5.1, hemoglobin 10.3.  Magnesium 2.5.  Urinalysis with positive ketones.  Beta hydroxybutyrate of 4.8.  VBG with a pH of 7.2, bicarb of 18, CO2 of 42.  Started on the DKA protocol in the ER.  EKG fairly stable with accelerated junctional rhythm.  No overt peaked T waves. Review of Systems: As mentioned in the history of present illness. All other systems reviewed and are negative. Past Medical History:  Diagnosis Date   Acute metabolic encephalopathy 12/02/2020   DKA (diabetic ketoacidoses) 04/06/2016   Hypercholesteremia    Hypertension    Past Surgical History:  Procedure Laterality  Date   AMPUTATION TOE Left 04/25/2022   Procedure: LEFT GREAT TOE AMPUTATION AND LEFT PARTIAL 2ND TOE AMPUTATION;  Surgeon: Felecia ShellingEvans, Brent M, DPM;  Location: [facility] ORS;  Service: Podiatry;  Laterality: Left;   COLONOSCOPY WITH PROPOFOL N/A 02/01/2020   Procedure: COLONOSCOPY WITH PROPOFOL;  Surgeon: Toney ReilVanga, Rohini Reddy, MD;  Location[facility];  Service: Gastroenterology;  Laterality: N/A;   ESOPHAGOGASTRODUODENOSCOPY  02/01/2020   Procedure: ESOPHAGOGASTRODUODENOSCOPY (EGD);  Surgeon: Toney ReilVanga, Rohini Reddy, MD;  [facility];  Service: Gastroenterology;;   ESOPHAGOGASTRODUODENOSCOPY (EGD) WITH PROPOFOL N/A 06/07/2022   Procedure: ESOPHAGOGASTRODUODENOSCOPY (EGD) WITH PROPOFOL;  Surgeon: Kerin SalenKarki, Arya, MD;  Location: [facility] ENDOSCOPY;  Service: Gastroenterology;  Laterality: N/A;   KNEE SURGERY Right    Torn meniscus   KNEE SURGERY Left    Social History:  reports that he has never smoked. He has never used smokeless tobacco. He reports that he does not currently use alcohol. He reports current drug use. Drugs: Marijuana and "Crack" cocaine.  Allergies  Allergen Reactions   Penicillins Other (See Comments)    Childhood allergy -tolerated amoxil 03/2022 Unknown reaction Has patient had a PCN reaction causing immediate rash, facial/tongue/throat swelling, SOB or lightheadedness with hypotension: No Has patient had a PCN reaction causing severe rash involving mucus membranes or skin necrosis: No Has patient had a PCN reaction that required hospitalization No Has patient had a PCN reaction occurring within the last 10 years: No If all of the above answers are "NO", then may proceed with Cephalosporin use.     Family History  Problem Relation Age of Onset  Heart attack Father    Hypertension Sister    Cancer Sister     Prior to Admission medications   Medication Sig Start Date End Date Taking? Authorizing Provider  acetaminophen (TYLENOL) 500 MG tablet Take 1,000 mg by mouth daily  as needed for mild pain or moderate pain.   Yes [provider]  ascorbic acid (VITAMIN C) 500 MG tablet Take 1 tablet (500 mg total) by mouth daily. 04/09/22  Yes Arnetha Courser, MD  aspirin EC 81 MG tablet Take 81 mg by mouth in the morning.   Yes [provider]  atorvastatin (LIPITOR) 80 MG tablet Take 1 tablet (80 mg total) by mouth at bedtime. Hold while taking Paxlovid 04/08/22  Yes Arnetha Courser, MD  Baclofen 5 MG TABS Take 5 mg by mouth daily as needed (muscle spasm).   Yes [provider]  clopidogrel (PLAVIX) 75 MG tablet Take 75 mg by mouth in the morning. 12/03/21  Yes [provider]  dapagliflozin propanediol (FARXIGA) 10 MG TABS tablet Take 1 tablet by mouth daily. 06/25/22  Yes [provider]  docusate sodium (COLACE) 100 MG capsule Take 100 mg by mouth daily as needed for mild constipation.   Yes [provider]  fludrocortisone (FLORINEF) 0.1 MG tablet Take 2 tablets (0.2 mg total) by mouth daily. 05/29/22  Yes Sreenath, Sudheer B, MD  insulin degludec (TRESIBA FLEXTOUCH) 100 UNIT/ML FlexTouch Pen Inject 15 Units into the skin daily. 03/12/22  Yes Charise Killian, MD  lisinopril (ZESTRIL) 2.5 MG tablet Take 2.5 mg by mouth daily. 06/25/22  Yes [provider]  Multiple Vitamin (MULTIVITAMIN WITH MINERALS) TABS tablet Take 1 tablet by mouth daily. 04/09/22  Yes Arnetha Courser, MD  NOVOLOG 100 UNIT/ML injection Sliding Scale: Glucose 70 - 120: 0 units  Glucose 121 - 150: 1 unit  Glucose 151 - 200: 2 units Glucose 201 - 250: 3 units  Glucose 251 - 300: 5 units  Glucose 301 - 350: 7 units  Glucose 351 - 400: 9 units  Glucose > 400: call your docotr 06/12/22  Yes Jerald Kief, MD  ondansetron (ZOFRAN) 8 MG tablet Take 8 mg by mouth daily as needed for nausea or vomiting.   Yes [provider]  pantoprazole (PROTONIX) 40 MG tablet Take 1 tablet (40 mg total) by mouth 2 (two) times daily. Take twice daily for 2  months then decrease to once daily 06/12/22  Yes Russella Dar, NP  feeding supplement, GLUCERNA SHAKE, (GLUCERNA SHAKE) LIQD Take 237 mLs by mouth 3 (three) times daily between meals. 04/29/22   Esaw Grandchild A, DO  ferrous sulfate 325 (65 FE) MG tablet Take 1 tablet (325 mg total) by mouth daily with breakfast. Patient not taking: Reported on 07/09/2022 04/09/22   Arnetha Courser, MD  guaiFENesin-dextromethorphan (ROBITUSSIN DM) 100-10 MG/5ML syrup Take 10 mLs by mouth every 4 (four) hours as needed for cough. Patient not taking: Reported on 07/09/2022 04/08/22   Arnetha Courser, MD  HYDROcodone-acetaminophen (NORCO/VICODIN) 5-325 MG tablet Take 1 tablet by mouth every 6 (six) hours as needed for severe pain. Patient not taking: Reported on 07/09/2022 05/28/22   Tresa Moore, MD  midodrine (PROAMATINE) 5 MG tablet Take 3 tablets (15 mg total) by mouth 3 (three) times daily with meals. Patient not taking: Reported on 07/09/2022 05/28/22   Tresa Moore, MD  tamsulosin (FLOMAX) 0.4 MG CAPS capsule Take 0.4 mg by mouth daily. Patient not taking: Reported on 07/09/2022  [provider]  zinc sulfate 220 (50 Zn) MG capsule Take 1 capsule (220 mg total) by mouth daily. Patient not taking: Reported on 07/09/2022 04/09/22   Arnetha Courser, MD    Physical Exam: Vitals:   07/09/22 0251 07/09/22 0252 07/09/22 0415 07/09/22 0700  BP:  122/74 (!) 149/129 (!) 132/52  Pulse:  93 98 95  Resp:  15 20 11   Temp:  97.9 F (36.6 C)    TempSrc:  Oral    SpO2:  100% 100% 93%  Weight: 68.5 kg     Height: 5\' 10"  (1.778 m)      Physical Exam Constitutional:      Appearance: He is normal weight.  HENT:     Head: Normocephalic and atraumatic.     Nose: Nose normal.     Mouth/Throat:     Mouth: Mucous membranes are dry.  Eyes:     Pupils: Pupils are equal, round, and reactive to light.  Cardiovascular:     Rate and Rhythm: Normal rate and regular rhythm.  Pulmonary:     Effort: Pulmonary  effort is normal.  Abdominal:     General: Bowel sounds are normal.  Musculoskeletal:        General: Normal range of motion.  Skin:    General: Skin is warm.  Neurological:     General: No focal deficit present.  Psychiatric:        Mood and Affect: Mood normal.     Data Reviewed:  There are no new results to review at this time. DG Chest Portable 1 View CLINICAL DATA:  Weakness  EXAM: PORTABLE CHEST 1 VIEW  COMPARISON:  04/21/2022  FINDINGS: Cardiac shadow is within normal limits. Lungs are well aerated bilaterally. Skin fold is noted over the right chest. No pneumothorax is seen. An old left ninth rib fracture is noted without complicating factors.  IMPRESSION: No acute abnormality noted.  Electronically Signed   By: Alcide Clever M.D.   On: 07/09/2022 03:22  Lab Results  Component Value Date   WBC 5.1 07/09/2022   HGB 10.3 (L) 07/09/2022   HCT 33.4 (L) 07/09/2022   MCV 99.1 07/09/2022   PLT 237 07/09/2022   Last metabolic panel Lab Results  Component Value Date   GLUCOSE 730 (HH) 07/09/2022   NA 130 (L) 07/09/2022   K 6.2 (H) 07/09/2022   CL 96 (L) 07/09/2022   CO2 18 (L) 07/09/2022   BUN 79 (H) 07/09/2022   CREATININE 2.70 (H) 07/09/2022   GFRNONAA 25 (L) 07/09/2022   CALCIUM 9.0 07/09/2022   PHOS 3.0 05/28/2022   PROT 9.1 (H) 07/09/2022   ALBUMIN 3.5 07/09/2022   LABGLOB 4.8 (H) 04/03/2022   AGRATIO 0.6 (L) 10/03/2020   BILITOT 1.1 07/09/2022   ALKPHOS 64 07/09/2022   AST 26 07/09/2022   ALT 28 07/09/2022   ANIONGAP 16 (H) 07/09/2022    Assessment and Plan: DKA (diabetic ketoacidosis) Blood sugar in 700s on presentation with pH of 7.2 on VBG, positive urine ketones Suspect dietary etiology-baseline poorly controlled  Hemoglobin A1c 9.1 (05/20/2022)  Started on DKA protocol in ER-will continue  Monitor  Bridge to long acting insulin and SSI once hyperglycemia and acidosis has resolved      Acute renal failure superimposed on stage  3a chronic kidney disease Cr 2.7 today w/ baseline Cr 1-1.7  BUN:Cr ratio 29  Suspect prerenal etiology in setting of DKA and dehydration   Cont w/ aggressive IVF hydration  Check  FeNa  Renal imaging as appropriate Hold nephrotoxic agents   Cocaine abuse No reported illicit drug  UDS x 1 Follow    Essential (primary) hypertension BP stable  Titrate management w/ orthostatic hypotension on midodrine   History of CVA (cerebrovascular accident) On ASA, plavix and statin  Cont Trend hgb in setting of prior admissions for bleeding issues including GIB and SDH  Follow    Dyslipidemia Cont atorvastatin    PUD (peptic ulcer disease) Recent admission 05/2022 for UGIB on high dose PPI  Hgb stable at 10.3  Cont PPI  Follow    Orthostatic hypotension On midodrine  Monitor and titrate in setting of baseline HTN       Advance Care Planning:   Code Status: Full Code   Consults: None   Family Communication: No family at the bedside  Greater than 50% was spent in counseling and coordination of care with patient Total encounter time 80 minutes or more   Severity of Illness: The appropriate patient status for this patient is INPATIENT. Inpatient status is judged to be reasonable and necessary in order to provide the required intensity of service to ensure the patient's safety. The patient's presenting symptoms, physical exam findings, and initial radiographic and laboratory data in the context of their chronic comorbidities is felt to place them at high risk for further clinical deterioration. Furthermore, it is not anticipated that the patient will be medically stable for discharge from the hospital within 2 midnights of admission.   * I certify that at the point of admission it is my clinical judgment that the patient will require inpatient hospital care spanning beyond 2 midnights from the point of admission due to high intensity of service, high risk for further deterioration  and high frequency of surveillance required.*  Author: Floydene Flock, MD 07/09/2022 8:07 AM  For on call review [email or web address].

## 2022-07-09 NOTE — Assessment & Plan Note (Signed)
No reported illicit drug  UDS x 1 Follow

## 2022-07-09 NOTE — ED Notes (Signed)
Pt's partner updated

## 2022-07-09 NOTE — Assessment & Plan Note (Signed)
On ASA, plavix and statin  Cont Trend hgb in setting of prior admissions for bleeding issues including GIB and SDH  Follow

## 2022-07-09 NOTE — ED Triage Notes (Signed)
Brought in via medic for weakness x several days, hyperglycemic when medic arrived.

## 2022-07-09 NOTE — Assessment & Plan Note (Addendum)
Cr 2.7 today w/ baseline Cr 1-1.7  BUN:Cr ratio 29  Suspect prerenal etiology in setting of DKA and dehydration   Cont w/ aggressive IVF hydration  Check FeNa  Renal imaging as appropriate Hold nephrotoxic agents

## 2022-07-09 NOTE — TOC CM/SW Note (Signed)
Went by room for readmission prevention screen but patient was sleeping and did not wake up to CSW calling him name. Will try again later. Per [facility] liaison, he is active with RN, PT, and OT.  Charlynn Court, CSW [phone]'

## 2022-07-09 NOTE — Assessment & Plan Note (Addendum)
Blood sugar in 700s on presentation with pH of 7.2 on VBG, positive urine ketones Suspect dietary etiology-baseline poorly controlled  Hemoglobin A1c 9.1 (05/20/2022)  Started on DKA protocol in ER-will continue  Monitor  Bridge to long acting insulin and SSI once hyperglycemia and acidosis has resolved

## 2022-07-10 ENCOUNTER — Ambulatory Visit: Payer: 59 | Admitting: Oncology

## 2022-07-10 ENCOUNTER — Ambulatory Visit: Payer: 59

## 2022-07-10 ENCOUNTER — Other Ambulatory Visit: Payer: 59

## 2022-07-10 ENCOUNTER — Inpatient Hospital Stay: Payer: 59

## 2022-07-10 DIAGNOSIS — F1410 Cocaine abuse, uncomplicated: Secondary | ICD-10-CM

## 2022-07-10 DIAGNOSIS — E1010 Type 1 diabetes mellitus with ketoacidosis without coma: Secondary | ICD-10-CM | POA: Diagnosis not present

## 2022-07-10 DIAGNOSIS — N179 Acute kidney failure, unspecified: Secondary | ICD-10-CM | POA: Diagnosis not present

## 2022-07-10 DIAGNOSIS — I10 Essential (primary) hypertension: Secondary | ICD-10-CM | POA: Diagnosis not present

## 2022-07-10 LAB — BASIC METABOLIC PANEL
Anion gap: 10 (ref 5–15)
BUN: 61 mg/dL — ABNORMAL HIGH (ref 8–23)
CO2: 24 mmol/L (ref 22–32)
Calcium: 9 mg/dL (ref 8.9–10.3)
Chloride: 104 mmol/L (ref 98–111)
Creatinine, Ser: 1.7 mg/dL — ABNORMAL HIGH (ref 0.61–1.24)
GFR, Estimated: 43 mL/min — ABNORMAL LOW (ref >60)
Glucose, Bld: 26 mg/dL — CL (ref 70–99)
Potassium: 3.4 mmol/L — ABNORMAL LOW (ref 3.5–5.1)
Sodium: 138 mmol/L (ref 135–145)

## 2022-07-10 LAB — GLUCOSE, CAPILLARY
Glucose-Capillary: 13 mg/dL — CL (ref 70–99)
Glucose-Capillary: 133 mg/dL — ABNORMAL HIGH (ref 70–99)
Glucose-Capillary: 73 mg/dL (ref 70–99)
Glucose-Capillary: 159 mg/dL — ABNORMAL HIGH (ref 70–99)
Glucose-Capillary: 109 mg/dL — ABNORMAL HIGH (ref 70–99)
Glucose-Capillary: 70 mg/dL (ref 70–99)

## 2022-07-10 LAB — BETA-HYDROXYBUTYRIC ACID: Beta-Hydroxybutyric Acid: 0.09 mmol/L (ref 0.05–0.27)

## 2022-07-10 MED ORDER — DEXTROSE 50 % IV SOLN
25.0000 g | Freq: Once | INTRAVENOUS | Status: AC
  Administered 2022-07-10: 25 g via INTRAVENOUS

## 2022-07-10 MED ORDER — ENOXAPARIN SODIUM 40 MG/0.4ML IJ SOSY
40.0000 mg | PREFILLED_SYRINGE | INTRAMUSCULAR | Status: DC
  Administered 2022-07-10: 40 mg via SUBCUTANEOUS
  Filled 2022-07-10: qty 0.4

## 2022-07-10 MED ORDER — POTASSIUM CHLORIDE CRYS ER 20 MEQ PO TBCR
40.0000 meq | EXTENDED_RELEASE_TABLET | Freq: Once | ORAL | Status: AC
  Administered 2022-07-10: 40 meq via ORAL
  Filled 2022-07-10: qty 2

## 2022-07-10 MED ORDER — HYDRALAZINE HCL 25 MG PO TABS
25.0000 mg | ORAL_TABLET | Freq: Three times a day (TID) | ORAL | Status: DC
  Administered 2022-07-10 – 2022-07-11 (×3): 25 mg via ORAL
  Filled 2022-07-10 (×3): qty 1

## 2022-07-10 NOTE — Assessment & Plan Note (Signed)
Continue ASA, plavix and statin. 

## 2022-07-10 NOTE — Inpatient Diabetes Management (Signed)
Inpatient Diabetes Program Recommendations  AACE/ADA: New Consensus Statement on Inpatient Glycemic Control (2015)  Target Ranges:  Prepandial:   less than 140 mg/dL      Peak postprandial:   less than 180 mg/dL (1-2 hours)      Critically ill patients:  140 - 180 mg/dL    Latest Reference Range & Units 07/09/22 14:35 07/09/22 15:26 07/09/22 16:37 07/09/22 17:42 07/09/22 18:48 07/09/22 19:48 07/09/22 21:47 07/10/22 05:07 07/10/22 05:33  Glucose-Capillary 70 - 99 mg/dL 993 (H)  IV Insulin Drip 124 (H) 92 145 (H)  19 units Levemir @1800  148 (H) 114 (H)  IV Insulin Drip Stopped 115 (H) 13 (LL) 133 (H)  (LL): Data is critically low (H): Data is abnormally high   Diabetes history:  DM1  Outpatient DM meds: Tresiba 15 units daily   Novolog 0-9 units TID             Farxiga 10 mg daily  Current Orders: Levemir 19 units Q24H     Novolog Sensitive Correction Scale/ SSI (0-9 units) TID AC + HS     Novolog 3 units TID with meals     MD- Note Severe Hypoglycemia at 5am today (CBG 13)--Was this accurate CBG??  Note pt was treated with D50%.  May consider reducing the Levemir to 15 units Q24H (home dose)   Patient has Type 1 DM and is well known to inpatient diabetes team due to multiple hospital admissions with DKA. Patient was last inpatient 06/06/22-06/12/22 and inpatient diabetes spoke with patient on 06/09/22 during that admission.  Patient is followed by Dr. Tedd Sias (Endocrinologist) and was last seen by her on 03/16/22.  Per office note on 03/16/22, patient was instructed to continue Tresiba 15 units daily and  IF EATING Take 3 unit of NovoLog if your sugar is 80 - 120 Take 5 unit of NovoLog if your sugar is 121-170 Take 7 units of NovoLog if your sugar is 171-220 Take 9 units of NovoLog if your sugar is 221-270 Take 10 units of NovoLog if your sugar is 271 or higher  IF NOT EATING (AND YOU HAVE NOT TAKEN NOVOLOG IN THE LAST 3 HOURS) Take 2 units of NovoLog if your sugar is  201-250 Take 4 units of NovoLog if your sugar is 251-300 Take 6 units of NovoLog if your sugar is 301 or higher      --Will follow patient during hospitalization--  Ambrose Finland RN, MSN, CDCES Diabetes Coordinator Inpatient Glycemic Control Team Team Pager: [phone] (8a-5p)

## 2022-07-10 NOTE — Progress Notes (Signed)
  Progress Note   Patient: Nicholas Weiss PTW:[id number] DOB: 1950/07/27 DOA: 07/09/2022     1 DOS: the patient was seen and examined on 07/10/2022   Brief hospital course: 72 y.o. male with medical history significant of multiple medical issues including substance abuse, poorly controlled type 1 diabetes, hypertension, orthostatic hypotension, history of CVA, stage III CKD, multiple myeloma, peptic ulcer disease presenting with DKA and acute on chronic stage III CKD admitted for DKA  4/12: off Insulin drip, transfer to floors  Assessment and Plan: DKA (diabetic ketoacidosis) Blood sugar in 700s on presentation with pH of 7.2 on VBG, positive urine ketones Suspect dietary etiology-baseline poorly controlled  Hemoglobin A1c 9.1 (05/20/2022)  DKA resolved.  Off insulin drip Started insulin Levemir 19 units subcu daily and NovoLog meal coverage in addition to sliding scale Appreciate diabetic nurse recommendation     Acute renal failure superimposed on stage 3a chronic kidney disease Cr 2.7 on admission w/ baseline Cr 1-1.7  Suspect prerenal etiology in setting of DKA and dehydration.  Improving with hydration  Lab Results  Component Value Date   CREATININE 1.70 (H) 07/10/2022   CREATININE 1.86 (H) 07/09/2022   CREATININE 2.24 (H) 07/09/2022      Cocaine abuse UDS positive for cocaine    Essential (primary) hypertension Start hydralazine for blood pressure control.  Blood pressure trending up  History of CVA (cerebrovascular accident) Continue ASA, plavix and statin    Dyslipidemia Cont atorvastatin.   PUD (peptic ulcer disease) Recent admission 05/2022 for UGIB on high dose PPI  Hgb stable  Cont PPI     Orthostatic hypotension On midodrine           Subjective: Denies any new symptoms.  Wants to eat.  Was having bowel movement earlier today  Physical Exam: Vitals:   07/10/22 1200 07/10/22 1300 07/10/22 1307 07/10/22 1424  BP: (!) 183/112  (!) 164/98  (!) 165/107  Pulse: 72 78  78  Resp: 11 20  18   Temp: (!) 97.2 F (36.2 C)   97.6 F (36.4 C)  TempSrc: Oral     SpO2: 100% 100%  100%  Weight:      Height:       72 year old male sitting in the bed comfortably without any acute distress Lungs clear to auscultation bilaterally Heart regular rate and rhythm Abdomen soft, benign Neuro alert and awake, nonfocal Psych normal mood and affect Data Reviewed:  Potassium 3.4, creatinine 1.7  Family Communication: None at bedside  Disposition: Status is: Inpatient Remains inpatient appropriate because: Management of AKI and blood sugar management  Planned Discharge Destination: Home   DVT prophylaxis-Lovenox Time spent: 35 minutes  Author: Delfino Lovett, MD 07/10/2022 9:04 PM  For on call review [email or web address].

## 2022-07-10 NOTE — Assessment & Plan Note (Signed)
Start hydralazine for blood pressure control.  Blood pressure trending up

## 2022-07-10 NOTE — Assessment & Plan Note (Signed)
Recent admission 05/2022 for UGIB on high dose PPI  Hgb stable  Cont PPI

## 2022-07-10 NOTE — Assessment & Plan Note (Signed)
Cr 2.7 on admission w/ baseline Cr 1-1.7  Suspect prerenal etiology in setting of DKA and dehydration.  Improving with hydration  Lab Results  Component Value Date   CREATININE 1.70 (H) 07/10/2022   CREATININE 1.86 (H) 07/09/2022   CREATININE 2.24 (H) 07/09/2022

## 2022-07-10 NOTE — Assessment & Plan Note (Signed)
Blood sugar in 700s on presentation with pH of 7.2 on VBG, positive urine ketones Suspect dietary etiology-baseline poorly controlled  Hemoglobin A1c 9.1 (05/20/2022)  DKA resolved.  Off insulin drip Started insulin Levemir 19 units subcu daily and NovoLog meal coverage in addition to sliding scale Appreciate diabetic nurse recommendation

## 2022-07-10 NOTE — Assessment & Plan Note (Signed)
On midodrine 

## 2022-07-10 NOTE — Hospital Course (Signed)
72 y.o. male with medical history significant of multiple medical issues including substance abuse, poorly controlled type 1 diabetes, hypertension, orthostatic hypotension, history of CVA, stage III CKD, multiple myeloma, peptic ulcer disease presenting with DKA and acute on chronic stage III CKD admitted for DKA  4/12: off Insulin drip, transfer to floors

## 2022-07-10 NOTE — Assessment & Plan Note (Signed)
UDS positive for cocaine ?

## 2022-07-10 NOTE — Assessment & Plan Note (Signed)
Cont atorvastatin  

## 2022-07-11 DIAGNOSIS — F1410 Cocaine abuse, uncomplicated: Secondary | ICD-10-CM | POA: Diagnosis not present

## 2022-07-11 DIAGNOSIS — E860 Dehydration: Secondary | ICD-10-CM | POA: Diagnosis not present

## 2022-07-11 DIAGNOSIS — E1010 Type 1 diabetes mellitus with ketoacidosis without coma: Secondary | ICD-10-CM | POA: Diagnosis not present

## 2022-07-11 DIAGNOSIS — N179 Acute kidney failure, unspecified: Secondary | ICD-10-CM | POA: Diagnosis not present

## 2022-07-11 LAB — BASIC METABOLIC PANEL
Anion gap: 7 (ref 5–15)
BUN: 44 mg/dL — ABNORMAL HIGH (ref 8–23)
CO2: 26 mmol/L (ref 22–32)
Calcium: 8.7 mg/dL — ABNORMAL LOW (ref 8.9–10.3)
Chloride: 105 mmol/L (ref 98–111)
Creatinine, Ser: 1.38 mg/dL — ABNORMAL HIGH (ref 0.61–1.24)
GFR, Estimated: 55 mL/min — ABNORMAL LOW (ref >60)
Glucose, Bld: 44 mg/dL — CL (ref 70–99)
Potassium: 4.3 mmol/L (ref 3.5–5.1)
Sodium: 138 mmol/L (ref 135–145)

## 2022-07-11 LAB — CBC
HCT: 29.2 % — ABNORMAL LOW (ref 39.0–52.0)
Hemoglobin: 9.4 g/dL — ABNORMAL LOW (ref 13.0–17.0)
MCH: 30.2 pg (ref 26.0–34.0)
MCHC: 32.2 g/dL (ref 30.0–36.0)
MCV: 93.9 fL (ref 80.0–100.0)
Platelets: 210 10*3/uL (ref 150–400)
RBC: 3.11 MIL/uL — ABNORMAL LOW (ref 4.22–5.81)
RDW: 16.6 % — ABNORMAL HIGH (ref 11.5–15.5)
WBC: 3.3 10*3/uL — ABNORMAL LOW (ref 4.0–10.5)
nRBC: 0 % (ref 0.0–0.2)

## 2022-07-11 LAB — GLUCOSE, CAPILLARY
Glucose-Capillary: 47 mg/dL — ABNORMAL LOW (ref 70–99)
Glucose-Capillary: 165 mg/dL — ABNORMAL HIGH (ref 70–99)
Glucose-Capillary: 73 mg/dL (ref 70–99)

## 2022-07-11 MED ORDER — ACETAMINOPHEN 325 MG PO TABS: 650.0000 mg | ORAL_TABLET | Freq: Four times a day (QID) | ORAL | Status: DC | PRN

## 2022-07-11 MED ORDER — HYDRALAZINE HCL 25 MG PO TABS: 25.0000 mg | ORAL_TABLET | Freq: Three times a day (TID) | ORAL | 0 refills | Status: DC

## 2022-07-11 NOTE — Discharge Summary (Signed)
Physician Discharge Summary   Patient: Nicholas Weiss MRN: [id number] DOB: September 22, 1950  Admit date:     07/09/2022  Discharge date: 07/11/22  Discharge Physician: Delfino Lovett   PCP: Marisue Ivan, MD   Recommendations at discharge:   Follow-up with outpatient providers as requested  Discharge Diagnoses: Active Problems:   Acute renal failure superimposed on stage 3a chronic kidney disease   DKA (diabetic ketoacidosis)   Essential (primary) hypertension   Cocaine abuse   Dyslipidemia   History of CVA (cerebrovascular accident)   PUD (peptic ulcer disease)   Hyperglycemia   Orthostatic hypotension  Hospital Course: 72 y.o. male with medical history significant of multiple medical issues including substance abuse, poorly controlled type 1 diabetes, hypertension, orthostatic hypotension, history of CVA, stage III CKD, multiple myeloma, peptic ulcer disease presenting with DKA and acute on chronic stage III CKD admitted for DKA  4/12: off Insulin drip, transfer to floors  Assessment and Plan: DKA (diabetic ketoacidosis) Blood sugar in 700s on presentation with pH of 7.2 on VBG, positive urine ketones Suspect dietary etiology-baseline poorly controlled  Hemoglobin A1c 9.1 (05/20/2022)  Patient is likely noncompliant and very brittle diabetic.  His blood sugar started dropping very quickly as low as 13 He is followed by endocrine Dr. Tedd Sias and prefers not to make any changes in his diabetic regimen..  I have requested him to follow-up closely and soon as possible with his endocrinologist  Acute renal failure superimposed on stage 3a chronic kidney disease Cr 2.7 on admission w/ baseline Cr 1-1.7  Improved with hydration  Cocaine abuse UDS positive for cocaine  Essential (primary) hypertension History of CVA (cerebrovascular accident) Continue ASA, plavix and statin   Dyslipidemia Cont atorvastatin.  PUD (peptic ulcer disease) Recent admission 05/2022 for UGIB on high  dose PPI  Hgb stable  Cont PPI   Patient was adamant and anxious to leave.  He did not want to stay any further in the hospital.  He was agreeable to follow-up outpatient with his PCP, endocrinologist and other specialists       Disposition: Home with home health PT, OT, RN Diet recommendation:  Discharge Diet Orders (From admission, onward)     Start     Ordered   07/11/22 0000  Diet - low sodium heart healthy        07/11/22 1027           Carb modified diet DISCHARGE MEDICATION: Allergies as of 07/11/2022       Reactions   Penicillins Other (See Comments)   Childhood allergy -tolerated amoxil 03/2022 Unknown reaction Has patient had a PCN reaction causing immediate rash, facial/tongue/throat swelling, SOB or lightheadedness with hypotension: No Has patient had a PCN reaction causing severe rash involving mucus membranes or skin necrosis: No Has patient had a PCN reaction that required hospitalization No Has patient had a PCN reaction occurring within the last 10 years: No If all of the above answers are "NO", then may proceed with Cephalosporin use.        Medication List     STOP taking these medications    fludrocortisone 0.1 MG tablet Commonly known as: FLORINEF       TAKE these medications    acetaminophen 500 MG tablet Commonly known as: TYLENOL Take 1,000 mg by mouth daily as needed for mild pain or moderate pain.   ascorbic acid 500 MG tablet Commonly known as: VITAMIN C Take 1 tablet (500 mg total) by mouth daily.  aspirin EC 81 MG tablet Take 81 mg by mouth in the morning.   atorvastatin 80 MG tablet Commonly known as: LIPITOR Take 1 tablet (80 mg total) by mouth at bedtime. Hold while taking Paxlovid   Baclofen 5 MG Tabs Take 5 mg by mouth daily as needed (muscle spasm).   clopidogrel 75 MG tablet Commonly known as: PLAVIX Take 75 mg by mouth in the morning.   dapagliflozin propanediol 10 MG Tabs tablet Commonly known as:  FARXIGA Take 1 tablet by mouth daily.   docusate sodium 100 MG capsule Commonly known as: COLACE Take 100 mg by mouth daily as needed for mild constipation.   feeding supplement (GLUCERNA SHAKE) Liqd Take 237 mLs by mouth 3 (three) times daily between meals.   hydrALAZINE 25 MG tablet Commonly known as: APRESOLINE Take 1 tablet (25 mg total) by mouth every 8 (eight) hours.   lisinopril 2.5 MG tablet Commonly known as: ZESTRIL Take 2.5 mg by mouth daily.   multivitamin with minerals Tabs tablet Take 1 tablet by mouth daily.   NovoLOG 100 UNIT/ML injection Generic drug: insulin aspart Sliding Scale: Glucose 70 - 120: 0 units  Glucose 121 - 150: 1 unit  Glucose 151 - 200: 2 units Glucose 201 - 250: 3 units  Glucose 251 - 300: 5 units  Glucose 301 - 350: 7 units  Glucose 351 - 400: 9 units  Glucose > 400: call your docotr   ondansetron 8 MG tablet Commonly known as: ZOFRAN Take 8 mg by mouth daily as needed for nausea or vomiting.   pantoprazole 40 MG tablet Commonly known as: Protonix Take 1 tablet (40 mg total) by mouth 2 (two) times daily. Take twice daily for 2 months then decrease to once daily   Tresiba FlexTouch 100 UNIT/ML FlexTouch Pen Generic drug: insulin degludec Inject 15 Units into the skin daily.        Follow-up Information     Marisue Ivan, MD. Schedule an appointment as soon as possible for a visit in 3 day(s).   Specialty: Family Medicine Why: [facility] Discharge F/UP Contact information: [address] [facility] [place] [address] [phone]         Raj Janus, MD. Schedule an appointment as soon as possible for a visit in 1 week(s).   Specialty: Endocrinology Why: [facility] Discharge F/UP Contact information: [address] [facility] [place] [address] [phone]                Discharge Exam: Ceasar Mons Weights   07/09/22 0251 07/09/22 1320  Weight: 68.5 kg  73.24 kg   72 year old male sitting in the bed comfortably without any acute distress Lungs clear to auscultation bilaterally Heart regular rate and rhythm Abdomen soft, benign Neuro alert and awake, nonfocal Psych normal mood and affect  Condition at discharge: fair  The results of significant diagnostics from this hospitalization (including imaging, microbiology, ancillary and laboratory) are listed below for reference.   Imaging Studies: DG Chest Portable 1 View  Result Date: 07/09/2022 CLINICAL DATA:  Weakness EXAM: PORTABLE CHEST 1 VIEW COMPARISON:  04/21/2022 FINDINGS: Cardiac shadow is within normal limits. Lungs are well aerated bilaterally. Skin fold is noted over the right chest. No pneumothorax is seen. An old left ninth rib fracture is noted without complicating factors. IMPRESSION: No acute abnormality noted. Electronically Signed   By: Alcide Clever M.D.   On: 07/09/2022 03:22    Microbiology: Results for orders placed  or performed during the hospital encounter of 07/09/22  Resp panel by RT-PCR (RSV, Flu A&B, Covid) Urine, Clean Catch     Status: None   Collection Time: 07/09/22  3:08 AM   Specimen: Urine, Clean Catch; Nasal Swab  Result Value Ref Range Status   SARS Coronavirus 2 by RT PCR NEGATIVE NEGATIVE Final    Comment: (NOTE) SARS-CoV-2 target nucleic acids are NOT DETECTED.  The SARS-CoV-2 RNA is generally detectable in upper respiratory specimens during the acute phase of infection. The lowest concentration of SARS-CoV-2 viral copies this assay can detect is 138 copies/mL. A negative result does not preclude SARS-Cov-2 infection and should not be used as the sole basis for treatment or other patient management decisions. A negative result may occur with  improper specimen collection/handling, submission of specimen other than nasopharyngeal swab, presence of viral mutation(s) within the areas targeted by this assay, and inadequate number of viral copies(<138  copies/mL). A negative result must be combined with clinical observations, patient history, and epidemiological information. The expected result is Negative.  Fact Sheet for Patients:  BloggerCourse.com  Fact Sheet for Healthcare Providers:  SeriousBroker.it  This test is no t yet approved or cleared by the [place] FDA and  has been authorized for detection and/or diagnosis of SARS-CoV-2 by FDA under an Emergency Use Authorization (EUA). This EUA will remain  in effect (meaning this test can be used) for the duration of the COVID-19 declaration under Section 564(b)(1) of the Act, 21 U.S.C.section 360bbb-3(b)(1), unless the authorization is terminated  or revoked sooner.       Influenza A by PCR NEGATIVE NEGATIVE Final   Influenza B by PCR NEGATIVE NEGATIVE Final    Comment: (NOTE) The Xpert Xpress SARS-CoV-2/FLU/RSV plus assay is intended as an aid in the diagnosis of influenza from Nasopharyngeal swab specimens and should not be used as a sole basis for treatment. Nasal washings and aspirates are unacceptable for Xpert Xpress SARS-CoV-2/FLU/RSV testing.  Fact Sheet for Patients: BloggerCourse.com  Fact Sheet for Healthcare Providers: SeriousBroker.it  This test is not yet approved or cleared by the [place] FDA and has been authorized for detection and/or diagnosis of SARS-CoV-2 by FDA under an Emergency Use Authorization (EUA). This EUA will remain in effect (meaning this test can be used) for the duration of the COVID-19 declaration under Section 564(b)(1) of the Act, 21 U.S.C. section 360bbb-3(b)(1), unless the authorization is terminated or revoked.     Resp Syncytial Virus by PCR NEGATIVE NEGATIVE Final    Comment: (NOTE) Fact Sheet for Patients: BloggerCourse.com  Fact Sheet for Healthcare  Providers: SeriousBroker.it  This test is not yet approved or cleared by the [place] FDA and has been authorized for detection and/or diagnosis of SARS-CoV-2 by FDA under an Emergency Use Authorization (EUA). This EUA will remain in effect (meaning this test can be used) for the duration of the COVID-19 declaration under Section 564(b)(1) of the Act, 21 U.S.C. section 360bbb-3(b)(1), unless the authorization is terminated or revoked.  Performed at [facility], [address] [place], [place] [address]   MRSA Next Gen by PCR, Nasal     Status: Abnormal   Collection Time: 07/09/22 11:51 AM   Specimen: Nasal Mucosa; Nasal Swab  Result Value Ref Range Status   MRSA by PCR Next Gen DETECTED (A) NOT DETECTED Final    Comment: RESULT CALLED TO, READ BACK BY AND VERIFIED WITH: Barbaraann Share RN @1406  07/09/2022 [facility] (NOTE) The GeneXpert MRSA Assay (FDA  approved for NASAL specimens only), is one component of a comprehensive MRSA colonization surveillance program. It is not intended to diagnose MRSA infection nor to guide or monitor treatment for MRSA infections. Test performance is not FDA approved in patients less than 70 years old. Performed at [facility], [address] [place], [place] [address]     Labs: CBC: Recent Labs  Lab 07/09/22 0256 07/09/22 0759 07/11/22 0446  WBC 5.1 6.0 3.3*  NEUTROABS 3.4  --   --   HGB 10.3* 9.6* 9.4*  HCT 33.4* 30.3* 29.2*  MCV 99.1 97.4 93.9  PLT 237 226 210   Basic Metabolic Panel: Recent Labs  Lab 07/09/22 0256 07/09/22 0759 07/09/22 1151 07/09/22 1528 07/10/22 0434 07/11/22 0446  NA 130* 131* 136 134* 138 138  K 6.2* 4.7 4.4 4.5 3.4* 4.3  CL 96* 101 104 103 104 105  CO2 18* 19* GLUCOSE 730* 573* 263* 101* 26* 44*  BUN 79* 78* 73* 71* 61* 44*  CREATININE 2.70* 2.55* 2.24* 1.86* 1.70* 1.38*  CALCIUM 9.0 8.8* 9.1 8.9 9.0 8.7*  MG 2.5*  --   --   --   --   --     Liver Function Tests: Recent Labs  Lab 07/09/22 0256  AST 26  ALT 28  ALKPHOS 64  BILITOT 1.1  PROT 9.1*  ALBUMIN 3.5   CBG: Recent Labs  Lab 07/10/22 1632 07/10/22 2113 07/11/22 0622 07/11/22 0704 07/11/22 0902  GLUCAP 109* 70 47* 165* 73    Discharge time spent: greater than 30 minutes.  Signed: Delfino Lovett, MD [facility] 07/11/2022

## 2022-07-11 NOTE — Progress Notes (Signed)
Patient was hypoglycemic PRN protocol following sugar increased to 47.

## 2022-07-11 NOTE — TOC Transition Note (Signed)
Transition of Care [facility]) - CM/SW Discharge Note   Patient Details  Name: Nicholas Weiss MRN: [id number] Date of Birth: Nov 13, 1950  [facility] CM/SW Contact:  Allena Katz, LCSW Phone Number: 07/11/2022, 10:44 AM   Clinical Narrative:  Pt has orders to discharge home. Pt active with [facility] PT/OT/RN. [facility] notified.  Patients PCP is Marisue Ivan, MD and Patient uses [facility] as his pharmacy.  Pt will discharge home with his girlfriend sharon.        Patient Goals and CMS Choice      Discharge Placement                         Discharge Plan and Services Additional resources added to the After Visit Summary for                                       Social Determinants of Health (SDOH) Interventions SDOH Screenings   Food Insecurity: No Food Insecurity (07/09/2022)  Housing: Low Risk  (07/09/2022)  Transportation Needs: No Transportation Needs (07/09/2022)  Utilities: Not At Risk (07/09/2022)  Alcohol Screen: Low Risk  (10/02/2020)  Depression (PHQ2-9): Medium Risk (10/02/2020)  Financial Resource Strain: Low Risk  (01/01/2020)  Physical Activity: Insufficiently Active (01/01/2020)  Social Connections: Moderately Isolated (01/01/2020)  Stress: No Stress Concern Present (01/01/2020)  Tobacco Use: Low Risk  (07/09/2022)     Readmission Risk Interventions    05/20/2022   11:41 AM 03/12/2022   10:22 AM 02/25/2022    2:43 PM  Readmission Risk Prevention Plan  Transportation Screening Complete Complete Complete  Medication Review Oceanographer) Complete Complete Complete  PCP or Specialist appointment within 3-5 days of discharge Complete Complete Complete  HRI or Home Care Consult Complete Complete Complete  SW Recovery Care/Counseling Consult  Complete   Palliative Care Screening Complete Not Applicable Not Applicable  Skilled Nursing Facility Complete Not Applicable Not Applicable

## 2022-07-13 ENCOUNTER — Inpatient Hospital Stay
Admission: EM | Admit: 2022-07-13 | Discharge: 2022-07-16 | DRG: 637 | Disposition: A | Discharge status: Home / Self Care | Payer: 59 | Attending: Osteopathic Medicine | Admitting: Osteopathic Medicine

## 2022-07-13 ENCOUNTER — Other Ambulatory Visit: Payer: Self-pay

## 2022-07-13 ENCOUNTER — Encounter: Payer: Self-pay | Admitting: Internal Medicine

## 2022-07-13 DIAGNOSIS — D631 Anemia in chronic kidney disease: Secondary | ICD-10-CM | POA: Diagnosis present

## 2022-07-13 DIAGNOSIS — G9341 Metabolic encephalopathy: Secondary | ICD-10-CM | POA: Diagnosis present

## 2022-07-13 DIAGNOSIS — F1410 Cocaine abuse, uncomplicated: Secondary | ICD-10-CM

## 2022-07-13 DIAGNOSIS — Z89412 Acquired absence of left great toe: Secondary | ICD-10-CM | POA: Diagnosis not present

## 2022-07-13 DIAGNOSIS — Z79899 Other long term (current) drug therapy: Secondary | ICD-10-CM

## 2022-07-13 DIAGNOSIS — E875 Hyperkalemia: Secondary | ICD-10-CM | POA: Diagnosis present

## 2022-07-13 DIAGNOSIS — I10 Essential (primary) hypertension: Secondary | ICD-10-CM | POA: Diagnosis present

## 2022-07-13 DIAGNOSIS — N1831 Chronic kidney disease, stage 3a: Secondary | ICD-10-CM | POA: Diagnosis present

## 2022-07-13 DIAGNOSIS — Z794 Long term (current) use of insulin: Secondary | ICD-10-CM | POA: Diagnosis not present

## 2022-07-13 DIAGNOSIS — C9000 Multiple myeloma not having achieved remission: Secondary | ICD-10-CM | POA: Diagnosis present

## 2022-07-13 DIAGNOSIS — E7800 Pure hypercholesterolemia, unspecified: Secondary | ICD-10-CM | POA: Diagnosis present

## 2022-07-13 DIAGNOSIS — D649 Anemia, unspecified: Secondary | ICD-10-CM | POA: Diagnosis present

## 2022-07-13 DIAGNOSIS — Z880 Allergy status to penicillin: Secondary | ICD-10-CM | POA: Diagnosis not present

## 2022-07-13 DIAGNOSIS — E1010 Type 1 diabetes mellitus with ketoacidosis without coma: Secondary | ICD-10-CM | POA: Diagnosis present

## 2022-07-13 DIAGNOSIS — D638 Anemia in other chronic diseases classified elsewhere: Secondary | ICD-10-CM | POA: Diagnosis not present

## 2022-07-13 DIAGNOSIS — Z8673 Personal history of transient ischemic attack (TIA), and cerebral infarction without residual deficits: Secondary | ICD-10-CM

## 2022-07-13 DIAGNOSIS — E1022 Type 1 diabetes mellitus with diabetic chronic kidney disease: Secondary | ICD-10-CM | POA: Diagnosis present

## 2022-07-13 DIAGNOSIS — Z809 Family history of malignant neoplasm, unspecified: Secondary | ICD-10-CM

## 2022-07-13 DIAGNOSIS — E08621 Diabetes mellitus due to underlying condition with foot ulcer: Secondary | ICD-10-CM

## 2022-07-13 DIAGNOSIS — Z7982 Long term (current) use of aspirin: Secondary | ICD-10-CM

## 2022-07-13 DIAGNOSIS — I129 Hypertensive chronic kidney disease with stage 1 through stage 4 chronic kidney disease, or unspecified chronic kidney disease: Secondary | ICD-10-CM | POA: Diagnosis present

## 2022-07-13 DIAGNOSIS — E10649 Type 1 diabetes mellitus with hypoglycemia without coma: Secondary | ICD-10-CM | POA: Diagnosis not present

## 2022-07-13 DIAGNOSIS — E1110 Type 2 diabetes mellitus with ketoacidosis without coma: Secondary | ICD-10-CM | POA: Diagnosis present

## 2022-07-13 DIAGNOSIS — N179 Acute kidney failure, unspecified: Secondary | ICD-10-CM | POA: Diagnosis present

## 2022-07-13 DIAGNOSIS — Z7902 Long term (current) use of antithrombotics/antiplatelets: Secondary | ICD-10-CM

## 2022-07-13 DIAGNOSIS — Z91148 Patient's other noncompliance with medication regimen for other reason: Secondary | ICD-10-CM | POA: Diagnosis not present

## 2022-07-13 DIAGNOSIS — E1040 Type 1 diabetes mellitus with diabetic neuropathy, unspecified: Secondary | ICD-10-CM

## 2022-07-13 DIAGNOSIS — I459 Conduction disorder, unspecified: Secondary | ICD-10-CM | POA: Diagnosis present

## 2022-07-13 DIAGNOSIS — Z8249 Family history of ischemic heart disease and other diseases of the circulatory system: Secondary | ICD-10-CM | POA: Diagnosis not present

## 2022-07-13 LAB — CBG MONITORING, ED: Glucose-Capillary: 567 mg/dL (ref 70–99)

## 2022-07-13 LAB — BASIC METABOLIC PANEL
Anion gap: 24 — ABNORMAL HIGH (ref 5–15)
Anion gap: 18 — ABNORMAL HIGH (ref 5–15)
BUN: 62 mg/dL — ABNORMAL HIGH (ref 8–23)
BUN: 66 mg/dL — ABNORMAL HIGH (ref 8–23)
CO2: 11 mmol/L — ABNORMAL LOW (ref 22–32)
CO2: 18 mmol/L — ABNORMAL LOW (ref 22–32)
Calcium: 8.8 mg/dL — ABNORMAL LOW (ref 8.9–10.3)
Calcium: 8.8 mg/dL — ABNORMAL LOW (ref 8.9–10.3)
Chloride: 102 mmol/L (ref 98–111)
Chloride: 106 mmol/L (ref 98–111)
Creatinine, Ser: 2.41 mg/dL — ABNORMAL HIGH (ref 0.61–1.24)
Creatinine, Ser: 2.53 mg/dL — ABNORMAL HIGH (ref 0.61–1.24)
GFR, Estimated: 28 mL/min — ABNORMAL LOW (ref >60)
GFR, Estimated: 27 mL/min — ABNORMAL LOW (ref >60)
Glucose, Bld: 678 mg/dL (ref 70–99)
Glucose, Bld: 511 mg/dL (ref 70–99)
Potassium: 6.3 mmol/L (ref 3.5–5.1)
Potassium: 3.5 mmol/L (ref 3.5–5.1)
Sodium: 137 mmol/L (ref 135–145)
Sodium: 142 mmol/L (ref 135–145)

## 2022-07-13 LAB — BETA-HYDROXYBUTYRIC ACID: Beta-Hydroxybutyric Acid: >8 mmol/L — ABNORMAL HIGH (ref 0.05–0.27)

## 2022-07-13 LAB — BLOOD GAS, VENOUS
Acid-base deficit: 14.1 mmol/L — ABNORMAL HIGH (ref 0.0–2.0)
Bicarbonate: 13.4 mmol/L — ABNORMAL LOW (ref 20.0–28.0)
O2 Saturation: 66.3 %
Patient temperature: 37
pCO2, Ven: 36 mmHg — ABNORMAL LOW (ref 44–60)
pH, Ven: 7.18 — CL (ref 7.25–7.43)
pO2, Ven: 48 mmHg — ABNORMAL HIGH (ref 32–45)

## 2022-07-13 LAB — URINALYSIS, ROUTINE W REFLEX MICROSCOPIC
Bilirubin Urine: NEGATIVE
Glucose, UA: >=500 mg/dL — AB
Hgb urine dipstick: NEGATIVE
Ketones, ur: 20 mg/dL — AB
Leukocytes,Ua: NEGATIVE
Nitrite: NEGATIVE
Protein, ur: NEGATIVE mg/dL
Specific Gravity, Urine: 1.021 (ref 1.005–1.030)
WBC, UA: NONE SEEN WBC/hpf (ref 0–5)
pH: 5 (ref 5.0–8.0)

## 2022-07-13 LAB — CBC
HCT: 33.2 % — ABNORMAL LOW (ref 39.0–52.0)
Hemoglobin: 9.8 g/dL — ABNORMAL LOW (ref 13.0–17.0)
MCH: 30.5 pg (ref 26.0–34.0)
MCHC: 29.5 g/dL — ABNORMAL LOW (ref 30.0–36.0)
MCV: 103.4 fL — ABNORMAL HIGH (ref 80.0–100.0)
Platelets: 251 10*3/uL (ref 150–400)
RBC: 3.21 MIL/uL — ABNORMAL LOW (ref 4.22–5.81)
RDW: 16.5 % — ABNORMAL HIGH (ref 11.5–15.5)
WBC: 4 10*3/uL (ref 4.0–10.5)
nRBC: 0 % (ref 0.0–0.2)

## 2022-07-13 LAB — GLUCOSE, CAPILLARY
Glucose-Capillary: 579 mg/dL (ref 70–99)
Glucose-Capillary: 588 mg/dL (ref 70–99)
Glucose-Capillary: 595 mg/dL (ref 70–99)
Glucose-Capillary: 487 mg/dL — ABNORMAL HIGH (ref 70–99)
Glucose-Capillary: 497 mg/dL — ABNORMAL HIGH (ref 70–99)
Glucose-Capillary: 430 mg/dL — ABNORMAL HIGH (ref 70–99)
Glucose-Capillary: 497 mg/dL — ABNORMAL HIGH (ref 70–99)
Glucose-Capillary: 347 mg/dL — ABNORMAL HIGH (ref 70–99)
Glucose-Capillary: 347 mg/dL — ABNORMAL HIGH (ref 70–99)

## 2022-07-13 LAB — COMPREHENSIVE METABOLIC PANEL
ALT: 32 U/L (ref 0–44)
AST: 25 U/L (ref 15–41)
Albumin: 3.4 g/dL — ABNORMAL LOW (ref 3.5–5.0)
Alkaline Phosphatase: 64 U/L (ref 38–126)
Anion gap: 21 — ABNORMAL HIGH (ref 5–15)
BUN: 63 mg/dL — ABNORMAL HIGH (ref 8–23)
CO2: 14 mmol/L — ABNORMAL LOW (ref 22–32)
Calcium: 8.5 mg/dL — ABNORMAL LOW (ref 8.9–10.3)
Chloride: 102 mmol/L (ref 98–111)
Creatinine, Ser: 2.36 mg/dL — ABNORMAL HIGH (ref 0.61–1.24)
GFR, Estimated: 29 mL/min — ABNORMAL LOW (ref >60)
Glucose, Bld: 715 mg/dL (ref 70–99)
Potassium: 6.6 mmol/L (ref 3.5–5.1)
Sodium: 137 mmol/L (ref 135–145)
Total Bilirubin: 1.7 mg/dL — ABNORMAL HIGH (ref 0.3–1.2)
Total Protein: 8.7 g/dL — ABNORMAL HIGH (ref 6.5–8.1)

## 2022-07-13 LAB — LIPASE, BLOOD: Lipase: 22 U/L (ref 11–51)

## 2022-07-13 MED ORDER — ACETAMINOPHEN 650 MG RE SUPP: 650.0000 mg | Freq: Four times a day (QID) | RECTAL | Status: DC | PRN

## 2022-07-13 MED ORDER — ASPIRIN 81 MG PO TBEC
81.0000 mg | DELAYED_RELEASE_TABLET | Freq: Every day | ORAL | Status: DC
  Administered 2022-07-14 – 2022-07-16 (×3): 81 mg via ORAL
  Filled 2022-07-13 (×3): qty 1

## 2022-07-13 MED ORDER — PANTOPRAZOLE SODIUM 40 MG IV SOLR
40.0000 mg | INTRAVENOUS | Status: DC
  Administered 2022-07-13: 40 mg via INTRAVENOUS
  Filled 2022-07-13: qty 10

## 2022-07-13 MED ORDER — VITAMIN C 500 MG PO TABS
500.0000 mg | ORAL_TABLET | Freq: Every day | ORAL | Status: DC
  Administered 2022-07-13 – 2022-07-16 (×4): 500 mg via ORAL
  Filled 2022-07-13 (×4): qty 1

## 2022-07-13 MED ORDER — DEXTROSE IN LACTATED RINGERS 5 % IV SOLN
INTRAVENOUS | Status: DC
Start: 2022-07-13 — End: 2022-07-13

## 2022-07-13 MED ORDER — LACTATED RINGERS IV BOLUS
1000.0000 mL | Freq: Once | INTRAVENOUS | Status: AC
  Administered 2022-07-13: 1000 mL via INTRAVENOUS

## 2022-07-13 MED ORDER — ORAL CARE MOUTH RINSE: 15.0000 mL | OROMUCOSAL | Status: DC | PRN

## 2022-07-13 MED ORDER — DEXTROSE IN LACTATED RINGERS 5 % IV SOLN
INTRAVENOUS | Status: DC
  Administered 2022-07-14: 125 mL via INTRAVENOUS

## 2022-07-13 MED ORDER — ONDANSETRON HCL 4 MG/2ML IJ SOLN
4.0000 mg | Freq: Once | INTRAMUSCULAR | Status: AC
  Administered 2022-07-13: 4 mg via INTRAVENOUS
  Filled 2022-07-13: qty 2

## 2022-07-13 MED ORDER — ACETAMINOPHEN 325 MG PO TABS
650.0000 mg | ORAL_TABLET | Freq: Four times a day (QID) | ORAL | Status: DC | PRN
  Administered 2022-07-14 – 2022-07-16 (×4): 650 mg via ORAL
  Filled 2022-07-13 (×4): qty 2

## 2022-07-13 MED ORDER — CALCIUM GLUCONATE-NACL 1-0.675 GM/50ML-% IV SOLN
1.0000 g | Freq: Once | INTRAVENOUS | Status: AC
  Administered 2022-07-13: 1000 mg via INTRAVENOUS
  Filled 2022-07-13: qty 50

## 2022-07-13 MED ORDER — ENOXAPARIN SODIUM 30 MG/0.3ML IJ SOSY: 30.0000 mg | PREFILLED_SYRINGE | INTRAMUSCULAR | Status: DC

## 2022-07-13 MED ORDER — SODIUM BICARBONATE 8.4 % IV SOLN
50.0000 meq | Freq: Once | INTRAVENOUS | Status: AC
  Administered 2022-07-13: 50 meq via INTRAVENOUS
  Filled 2022-07-13: qty 50

## 2022-07-13 MED ORDER — ALBUTEROL SULFATE (2.5 MG/3ML) 0.083% IN NEBU
10.0000 mg | INHALATION_SOLUTION | Freq: Once | RESPIRATORY_TRACT | Status: AC
  Administered 2022-07-13: 10 mg via RESPIRATORY_TRACT
  Filled 2022-07-13: qty 12

## 2022-07-13 MED ORDER — DOCUSATE SODIUM 100 MG PO CAPS: 100.0000 mg | ORAL_CAPSULE | Freq: Every day | ORAL | Status: DC | PRN

## 2022-07-13 MED ORDER — CHLORHEXIDINE GLUCONATE CLOTH 2 % EX PADS
6.0000 | MEDICATED_PAD | Freq: Every day | CUTANEOUS | Status: DC
  Administered 2022-07-13 – 2022-07-16 (×4): 6 via TOPICAL

## 2022-07-13 MED ORDER — ADULT MULTIVITAMIN W/MINERALS CH
1.0000 | ORAL_TABLET | Freq: Every day | ORAL | Status: DC
  Administered 2022-07-13 – 2022-07-16 (×4): 1 via ORAL
  Filled 2022-07-13 (×4): qty 1

## 2022-07-13 MED ORDER — INSULIN REGULAR(HUMAN) IN NACL 100-0.9 UT/100ML-% IV SOLN
INTRAVENOUS | Status: AC
  Administered 2022-07-13: 6.5 [IU]/h via INTRAVENOUS
  Administered 2022-07-14: 0.3 [IU]/h via INTRAVENOUS
  Filled 2022-07-13 (×2): qty 100

## 2022-07-13 MED ORDER — LACTATED RINGERS IV SOLN: INTRAVENOUS | Status: DC

## 2022-07-13 MED ORDER — METOCLOPRAMIDE HCL 5 MG/ML IJ SOLN
10.0000 mg | Freq: Three times a day (TID) | INTRAMUSCULAR | Status: AC
  Administered 2022-07-13 – 2022-07-14 (×2): 10 mg via INTRAVENOUS
  Filled 2022-07-13 (×2): qty 2

## 2022-07-13 MED ORDER — ATORVASTATIN CALCIUM 20 MG PO TABS
80.0000 mg | ORAL_TABLET | Freq: Every day | ORAL | Status: DC
  Administered 2022-07-14 – 2022-07-15 (×2): 80 mg via ORAL
  Filled 2022-07-13 (×3): qty 4

## 2022-07-13 MED ORDER — DEXTROSE 50 % IV SOLN: 0.0000 mL | INTRAVENOUS | Status: DC | PRN

## 2022-07-13 MED ORDER — HYDRALAZINE HCL 50 MG PO TABS: 25.0000 mg | ORAL_TABLET | Freq: Three times a day (TID) | ORAL | Status: DC

## 2022-07-13 MED ORDER — CLOPIDOGREL BISULFATE 75 MG PO TABS
75.0000 mg | ORAL_TABLET | Freq: Every day | ORAL | Status: DC
  Administered 2022-07-13 – 2022-07-16 (×4): 75 mg via ORAL
  Filled 2022-07-13 (×4): qty 1

## 2022-07-13 MED ORDER — SODIUM ZIRCONIUM CYCLOSILICATE 10 G PO PACK
10.0000 g | PACK | Freq: Once | ORAL | Status: AC
  Administered 2022-07-13: 10 g via ORAL
  Filled 2022-07-13: qty 1

## 2022-07-13 NOTE — Assessment & Plan Note (Signed)
Patient noted to have an acute kidney injury secondary to GI losses from nausea and vomiting as well as osmotic diuresis. His baseline serum creatinine is 1.38 and today on admission it is 2.36 with elevated BUN Continue aggressive IV fluid hydration Repeat renal panel test in a.m.

## 2022-07-13 NOTE — ED Notes (Signed)
Attempted for 20g at R ac x1.Will place [place] IV asap.

## 2022-07-13 NOTE — H&P (Signed)
History and Physical    Patient: Nicholas Weiss NWG:[id number] DOB: 1950-04-08 DOA: 07/13/2022 DOS: the patient was seen and examined on 07/13/2022 PCP: Marisue Ivan, MD  Patient coming from: Home  Chief Complaint:  Chief Complaint  Patient presents with   Hyperglycemia   HPI: Nicholas Weiss is a 72 y.o. male with medical history significant for substance abuse, poorly controlled type 1 diabetes with complications of stage III chronic kidney disease, hypertension, orthostatic hypotension, history of CVA,  multiple myeloma, peptic ulcer disease, recent hospital discharge following an admission for DKA on 07/11/22 who returns to the emergency room via EMS for evaluation of " not feeling well".  According to the patient his blood sugars have been high and he only injected himself with insulin once since his discharge.  He states that he has not felt well and has been nauseous and so has not had anything to eat or drink.  Because of his poor oral intake he has not had any insulin.  He denies having any emesis but complains of feeling very weak. He denies having any chest pain, no shortness of breath, no headache, no abdominal pain, no changes in his bowel habits, no fever, no chills, no cough, no urinary symptoms, no focal deficits. Abnormal labs include potassium of 6.6, bicarb of 14, glucose 715, anion gap of 21 Twelve-lead EKG shows accelerated junctional rhythm.  Nonspecific intraventricular conduction delay. 2 L of IV fluid was ordered in the ER, calcium gluconate IV, insulin drip and albuterol. Patient will be admitted to the hospital for further evaluation      Review of Systems: As mentioned in the history of present illness. All other systems reviewed and are negative. Past Medical History:  Diagnosis Date   Acute metabolic encephalopathy 12/02/2020   DKA (diabetic ketoacidoses) 04/06/2016   Hypercholesteremia    Hypertension    Past Surgical History:  Procedure  Laterality Date   AMPUTATION TOE Left 04/25/2022   Procedure: LEFT GREAT TOE AMPUTATION AND LEFT PARTIAL 2ND TOE AMPUTATION;  Surgeon: Felecia Shelling, DPM;  Location: [facility] ORS;  Service: Podiatry;  Laterality: Left;   COLONOSCOPY WITH PROPOFOL N/A 02/01/2020   Procedure: COLONOSCOPY WITH PROPOFOL;  Surgeon: Toney Reil, MD;  Location: [facility] ENDOSCOPY;  Service: Gastroenterology;  Laterality: N/A;   ESOPHAGOGASTRODUODENOSCOPY  02/01/2020   Procedure: ESOPHAGOGASTRODUODENOSCOPY (EGD);  Surgeon: Toney Reil, MD;  Location: [facility] END[facility]COPY;  Service: Gastroenterology;;   ESOPHAGOGASTRODUODENOSCOPY (EGD) WITH PROPOFOL N/A 06/07/2022   Procedure: ESOPHAGOGASTRODUODENOSCOPY (EGD) WITH PROPOFOL;  Surgeon: Kerin Salen, MD;  Location: [facility] ENDOSCOPY;  Service: Gastroenterology;  Laterality: N/A;   KNEE SURGERY Right    Torn meniscus   KNEE SURGERY Left    Social History:  reports that he has never smoked. He has never used smokeless tobacco. He reports that he does not currently use alcohol. He reports current drug use. Drugs: Marijuana and "Crack" cocaine.  Allergies  Allergen Reactions   Penicillins Other (See Comments)    Childhood allergy -tolerated amoxil 03/2022 Unknown reaction Has patient had a PCN reaction causing immediate rash, facial/tongue/throat swelling, SOB or lightheadedness with hypotension: No Has patient had a PCN reaction causing severe rash involving mucus membranes or skin necrosis: No Has patient had a PCN reaction that required hospitalization No Has patient had a PCN reaction occurring within the last 10 years: No If all of the above answers are "NO", then may proceed with Cephalosporin use.     Family History  Problem Relation Age of  Onset   Heart attack Father    Hypertension Sister    Cancer Sister     Prior to Admission medications   Medication Sig Start Date End Date Taking? Authorizing Provider  NOVOLOG 100 UNIT/ML injection Sliding Scale: Glucose 70  - 120: 0 units  Glucose 121 - 150: 1 unit  Glucose 151 - 200: 2 units Glucose 201 - 250: 3 units  Glucose 251 - 300: 5 units  Glucose 301 - 350: 7 units  Glucose 351 - 400: 9 units  Glucose > 400: call your docotr 06/12/22  Yes Jerald Kief, MD  acetaminophen (TYLENOL) 500 MG tablet Take 1,000 mg by mouth daily as needed for mild pain or moderate pain.    [provider]  ascorbic acid (VITAMIN C) 500 MG tablet Take 1 tablet (500 mg total) by mouth daily. 04/09/22   Arnetha Courser, MD  aspirin EC 81 MG tablet Take 81 mg by mouth in the morning.    [provider]  atorvastatin (LIPITOR) 80 MG tablet Take 1 tablet (80 mg total) by mouth at bedtime. Hold while taking Paxlovid 04/08/22   Arnetha Courser, MD  Baclofen 5 MG TABS Take 5 mg by mouth daily as needed (muscle spasm).    [provider]  clopidogrel (PLAVIX) 75 MG tablet Take 75 mg by mouth in the morning. 12/03/21   [provider]  dapagliflozin propanediol (FARXIGA) 10 MG TABS tablet Take 1 tablet by mouth daily. 06/25/22   [provider]  docusate sodium (COLACE) 100 MG capsule Take 100 mg by mouth daily as needed for mild constipation.    [provider]  feeding supplement, GLUCERNA SHAKE, (GLUCERNA SHAKE) LIQD Take 237 mLs by mouth 3 (three) times daily between meals. 04/29/22   Esaw Grandchild A, DO  hydrALAZINE (APRESOLINE) 25 MG tablet Take 1 tablet (25 mg total) by mouth every 8 (eight) hours. 07/11/22 08/10/22  Delfino Lovett, MD  insulin degludec (TRESIBA FLEXTOUCH) 100 UNIT/ML FlexTouch Pen Inject 15 Units into the skin daily. 03/12/22   Charise Killian, MD  lisinopril (ZESTRIL) 2.5 MG tablet Take 2.5 mg by mouth daily. 06/25/22   [provider]  Multiple Vitamin (MULTIVITAMIN WITH MINERALS) TABS tablet Take 1 tablet by mouth daily. 04/09/22   Arnetha Courser, MD  ondansetron (ZOFRAN) 8 MG tablet Take 8 mg by mouth daily as needed for nausea or vomiting.    [provider]  pantoprazole (PROTONIX) 40 MG tablet Take 1 tablet (40 mg total) by mouth 2 (two) times daily. Take twice daily for 2 months then decrease to once daily 06/12/22   Russella Dar, NP    Physical Exam: Vitals:   07/13/22 1630 07/13/22 1715 07/13/22 1724 07/13/22 1725  BP: 134/74  131/79   Pulse: 99 99 98   Resp: Temp:      SpO2: 100% 100% 100%    Physical Exam Vitals and nursing note reviewed.  Constitutional:      Comments: Lethargic but arouses easily  HENT:     Head: Normocephalic and atraumatic.     Nose: Nose normal.     Mouth/Throat:     Mouth: Mucous membranes are dry.  Eyes:     Conjunctiva/sclera: Conjunctivae normal.  Cardiovascular:     Rate and Rhythm: Tachycardia present.  Pulmonary:     Effort: Pulmonary effort is normal.     Breath sounds: Normal breath sounds.  Abdominal:  General: Abdomen is flat. Bowel sounds are normal.     Palpations: Abdomen is soft.  Musculoskeletal:        General: Normal range of motion.     Cervical back: Normal range of motion and neck supple.  Skin:    General: Skin is warm and dry.  Neurological:     Motor: Weakness present.     Comments: Moves all extremities  Psychiatric:     Comments: Unable to assess     Data Reviewed: Relevant notes from primary care and specialist visits, past discharge summaries as available in EHR, including Care Everywhere. Prior diagnostic testing as pertinent to current admission diagnoses Updated medications and problem lists for reconciliation ED course, including vitals, labs, imaging, treatment and response to treatment Triage notes, nursing and pharmacy notes and ED provider's notes Notable results as noted in HPI Labs reviewed.  Sodium 137, potassium 6.6, chloride 102, bicarb 14, glucose 7015, BUN 63, creatinine 2.36, calcium 8.5, total protein 8.7, albumin 3.4, AST 25, ALT 32, alkaline phosphatase 64, total bilirubin 1.7, lipase 22, white count 4.0,  hemoglobin 9.8, hematocrit 33.2, platelet count 251  There are no new results to review at this time.  Assessment and Plan: * DKA (diabetic ketoacidosis) Patient with a history of type 1 diabetes mellitus and medication noncompliance  who was discharged from the hospital on 07/11/22 for DKA. He presents to the emergency room for evaluation of nausea, weakness and hypoglycemia and is found to have an anion gap metabolic acidosis with hyperkalemia and worsening of his renal function consistent with a DKA. Continue aggressive IV fluid hydration Continue insulin drip initiated in the ER Check BMP every 4 hours and supplement Check beta hydroxybutyric acid levels every 8 hours Diabetic education   Acute metabolic encephalopathy Secondary to significant hyperglycemia and AKI Patient is lethargic but arouses to loud verbal stimuli Expect improvement in mental status following resolution of hyperglycemia and AKI  Acute renal failure superimposed on stage 3a chronic kidney disease Patient noted to have an acute kidney injury secondary to GI losses from nausea and vomiting as well as osmotic diuresis. His baseline serum creatinine is 1.38 and today on admission it is 2.36 with elevated BUN Continue aggressive IV fluid hydration Repeat renal panel test in a.m.  Hyperkalemia Most likely secondary to anion gap Metabolic acidosis with concomitant ACE inhibitor use. Hold lisinopril Patient received calcium gluconate, albuterol and is currently on an insulin drip Repeat potassium levels  Anemia of chronic disease Monitor H&H closely  Essential (primary) hypertension Patient is normotensive Hold hydralazine  Cocaine abuse Will counsel patient on the need to abstain from illicit drug use once mental status improves  History of CVA (cerebrovascular accident) Continue aspirin, Plavix and high intensity statin      Advance Care Planning:   Code Status: Full Code   Consults: Diabetic  educator  Family Communication: No family at the bedside  Severity of Illness: The appropriate patient status for this patient is INPATIENT. Inpatient status is judged to be reasonable and necessary in order to provide the required intensity of service to ensure the patient's safety. The patient's presenting symptoms, physical exam findings, and initial radiographic and laboratory data in the context of their chronic comorbidities is felt to place them at high risk for further clinical deterioration. Furthermore, it is not anticipated that the patient will be medically stable for discharge from the hospital within 2 midnights of admission.   * I certify that at the point of  admission it is my clinical judgment that the patient will require inpatient hospital care spanning beyond 2 midnights from the point of admission due to high intensity of service, high risk for further deterioration and high frequency of surveillance required.*  Author: Lucile Shutters, MD 07/13/2022 5:40 PM  For on call review [email or web address].

## 2022-07-13 NOTE — ED Triage Notes (Signed)
Pt comes with c/o hyperglycemia. Pt was recently dc from hospital from DKA. Pt hasn't taken any meds per EMS and pt. Pt does have IV 18 in LAC and half bag saline. Pt denies any N/V  CBG reading high

## 2022-07-13 NOTE — Assessment & Plan Note (Signed)
Patient is normotensive Hold hydralazine

## 2022-07-13 NOTE — ED Notes (Signed)
Pt reports hasn't eaten in 2 days as this was how long he was home; states took only 1 dose of insulin during that time; states general weakness and nausea at times.

## 2022-07-13 NOTE — Assessment & Plan Note (Signed)
Patient with a history of type 1 diabetes mellitus and medication noncompliance  who was discharged from the hospital on 07/11/22 for DKA. He presents to the emergency room for evaluation of nausea, weakness and hypoglycemia and is found to have an anion gap metabolic acidosis with hyperkalemia and worsening of his renal function consistent with a DKA. Continue aggressive IV fluid hydration Continue insulin drip initiated in the ER Check BMP every 4 hours and supplement Check beta hydroxybutyric acid levels every 8 hours Diabetic education

## 2022-07-13 NOTE — Assessment & Plan Note (Addendum)
Will counsel patient on the need to abstain from illicit drug use once mental status improves

## 2022-07-13 NOTE — ED Notes (Signed)
EDP Jessup notified in person of critical potassium and BG results.

## 2022-07-13 NOTE — ED Notes (Signed)
EKG to EDP Jessup in person.  

## 2022-07-13 NOTE — ED Notes (Signed)
[place] in use currently by other RN for other pt; notified Barbee Cough RN this RN needs [place] once she is finished with it.

## 2022-07-13 NOTE — ED Notes (Signed)
Attending Agbata at bedside.  

## 2022-07-13 NOTE — Assessment & Plan Note (Signed)
Most likely secondary to anion gap Metabolic acidosis with concomitant ACE inhibitor use. Hold lisinopril Patient received calcium gluconate, albuterol and is currently on an insulin drip Repeat potassium levels

## 2022-07-13 NOTE — Assessment & Plan Note (Addendum)
Continue aspirin, Plavix and high intensity statin

## 2022-07-13 NOTE — Assessment & Plan Note (Signed)
Monitor H&H closely 

## 2022-07-13 NOTE — ED Provider Notes (Signed)
[facility] Provider Note    Event Date/Time   First MD Initiated Contact with Patient 07/13/22 1502     (approximate)   History   Chief Complaint Hyperglycemia   HPI  Nicholas Weiss is a 72 y.o. male with past medical history of hypertension, diabetes, stroke, CKD, multiple myeloma, and polysubstance abuse who presents to the ED complaining of hyperglycemia.  Patient reports that he was discharged from the hospital 2 days ago following admission for DKA.  He states that he used insulin as an as prescribed the day he returned home, but then admits to using cocaine and marijuana later that night.  He states he has been feeling ill since then with nausea and poor appetite.  He denies any associated abdominal pain, vomiting, diarrhea, fever, or dysuria.  He states he has not taken any of his insulin since then due to not eating anything.  He states that he has plenty of insulin available at home, eventually decided to call EMS when his blood sugars were running high today.  He denies any cough, chest pain, or shortness of breath.     Physical Exam   Triage Vital Signs: ED Triage Vitals  Enc Vitals Group     BP 07/13/22 1431 91/71     Pulse Rate 07/13/22 1431 98     Resp 07/13/22 1431 17     Temp 07/13/22 1431 98 F (36.7 C)     Temp src --      SpO2 07/13/22 1431 100 %     Weight --      Height --      Head Circumference --      Peak Flow --      Pain Score 07/13/22 1420 5     Pain Loc --      Pain Edu? --      Excl. in GC? --     Most recent vital signs: Vitals:   07/13/22 1532 07/13/22 1600  BP:  135/72  Pulse:  98  Resp: 17 (!) 22  Temp:    SpO2:  100%    Constitutional: Alert and oriented. Eyes: Conjunctivae are normal. Head: Atraumatic. Nose: No congestion/rhinnorhea. Mouth/Throat: Mucous membranes are dry. Cardiovascular: Normal rate, regular rhythm. Grossly normal heart sounds.  2+ radial pulses bilaterally. Respiratory: Normal  respiratory effort.  No retractions. Lungs CTAB. Gastrointestinal: Soft and nontender. No distention. Musculoskeletal: No lower extremity tenderness nor edema.  Neurologic:  Normal speech and language. No gross focal neurologic deficits are appreciated.    ED Results / Procedures / Treatments   Labs (all labs ordered are listed, but only abnormal results are displayed) Labs Reviewed  CBC - Abnormal; Notable for the following components:      Result Value   RBC 3.21 (*)    Hemoglobin 9.8 (*)    HCT 33.2 (*)    MCV 103.4 (*)    MCHC 29.5 (*)    RDW 16.5 (*)    All other components within normal limits  COMPREHENSIVE METABOLIC PANEL - Abnormal; Notable for the following components:   Potassium 6.6 (*)    CO2 14 (*)    Glucose, Bld 715 (*)    BUN 63 (*)    Creatinine, Ser 2.36 (*)    Calcium 8.5 (*)    Total Protein 8.7 (*)    Albumin 3.4 (*)    Total Bilirubin 1.7 (*)    GFR, Estimated 29 (*)    Anion  gap 21 (*)    All other components within normal limits  CBG MONITORING, ED - Abnormal; Notable for the following components:   Glucose-Capillary 567 (*)    All other components within normal limits  LIPASE, BLOOD  URINALYSIS, ROUTINE W REFLEX MICROSCOPIC  BASIC METABOLIC PANEL  BASIC METABOLIC PANEL  BASIC METABOLIC PANEL  BASIC METABOLIC PANEL  BETA-HYDROXYBUTYRIC ACID  BETA-HYDROXYBUTYRIC ACID  BLOOD GAS, VENOUS  CBG MONITORING, ED  CBG MONITORING, ED     EKG  ED ECG REPORT I, Chesley Noon, the attending physician, personally viewed and interpreted this ECG.   Date: 07/13/2022  EKG Time: 15:33  Rate: 95  Rhythm: normal sinus rhythm  Axis: Normal  Intervals:none  ST&T Change: None  PROCEDURES:  Critical Care performed: Yes, see critical care procedure note(s)  .Critical Care  Performed by: Chesley Noon, MD Authorized by: Chesley Noon, MD   Critical care provider statement:    Critical care time (minutes):  30   Critical care time was  exclusive of:  Separately billable procedures and treating other patients and teaching time   Critical care was necessary to treat or prevent imminent or life-threatening deterioration of the following conditions:  Endocrine crisis, metabolic crisis and renal failure   Critical care was time spent personally by me on the following activities:  Development of treatment plan with patient or surrogate, discussions with consultants, evaluation of patient's response to treatment, examination of patient, ordering and review of laboratory studies, ordering and review of radiographic studies, ordering and performing treatments and interventions, pulse oximetry, re-evaluation of patient's condition and review of old charts   I assumed direction of critical care for this patient from another provider in my specialty: no     Care discussed with: admitting provider      MEDICATIONS ORDERED IN ED: Medications  insulin regular, human (MYXREDLIN) 100 units/ 100 mL infusion (has no administration in time range)  lactated ringers infusion (has no administration in time range)  dextrose 5 % in lactated ringers infusion (has no administration in time range)  dextrose 50 % solution 0-50 mL (has no administration in time range)  calcium gluconate 1 g/ 50 mL sodium chloride IVPB (has no administration in time range)  sodium bicarbonate injection 50 mEq (has no administration in time range)  albuterol (PROVENTIL) (2.5 MG/3ML) 0.083% nebulizer solution 10 mg (has no administration in time range)  sodium zirconium cyclosilicate (LOKELMA) packet 10 g (has no administration in time range)  lactated ringers bolus 1,000 mL (1,000 mLs Intravenous New Bag/Given 07/13/22 1539)  ondansetron (ZOFRAN) injection 4 mg (4 mg Intravenous Given 07/13/22 1537)  lactated ringers bolus 1,000 mL (1,000 mLs Intravenous New Bag/Given 07/13/22 1622)     IMPRESSION / MDM / ASSESSMENT AND PLAN / ED COURSE  I reviewed the triage vital signs  and the nursing notes.                              72 y.o. male with past medical history of hypertension, diabetes, stroke, CKD, multiple myeloma, and polysubstance abuse who presents to the ED complaining of nausea, malaise, and hyperglycemia for the past 2 days, admits to cocaine use.  Patient's presentation is most consistent with acute presentation with potential threat to life or bodily function.  Differential diagnosis includes, but is not limited to, hypoglycemia, dehydration, DKA, AKI, HHS, UTI, substance abuse.  Patient nontoxic-appearing and in no acute distress, vital signs are  unremarkable.  He does appear clinically dehydrated with dry mucous membranes, likely due to hyperglycemia versus DKA.  Will hydrate with IV fluids, CBC is reassuring with stable chronic anemia, no significant leukocytosis noted.  CMP and lipase are pending at this time, will treat with IV Zofran.  He has a benign abdominal exam and denies any pain, will hold off on CT imaging.  Labs concerning for DKA with hyperglycemia, increased anion gap, and associated acidosis.  In addition to this, patient with AKI and hyperkalemia, no obvious EKG changes noted.  LFTs are unremarkable.  We will give dose of calcium, high-dose albuterol, bicarb, and Lokelma in addition to initiating insulin drip.  DKA appears secondary to medication noncompliance, no concerns for infectious process at this time.  Case discussed with hospitalist for admission.      FINAL CLINICAL IMPRESSION(S) / ED DIAGNOSES   Final diagnoses:  Diabetic ketoacidosis without coma associated with type 1 diabetes mellitus  Cocaine abuse  Noncompliance with medication regimen     Rx / DC Orders   ED Discharge Orders     None        Note:  This document was prepared using Dragon voice recognition software and may include unintentional dictation errors.   Chesley Noon, MD 07/13/22 [phone]

## 2022-07-13 NOTE — Assessment & Plan Note (Signed)
Secondary to significant hyperglycemia and AKI Patient is lethargic but arouses to loud verbal stimuli Expect improvement in mental status following resolution of hyperglycemia and AKI

## 2022-07-13 NOTE — ED Notes (Signed)
LR bolus paused as cannot confirm at this time that it is compatible with insulin gtt.

## 2022-07-13 NOTE — ED Notes (Signed)
Pt currently denies CP and nausea.

## 2022-07-13 NOTE — ED Notes (Signed)
EDP Jessup at bedside.  

## 2022-07-14 ENCOUNTER — Other Ambulatory Visit: Payer: Self-pay | Admitting: Oncology

## 2022-07-14 DIAGNOSIS — E1010 Type 1 diabetes mellitus with ketoacidosis without coma: Secondary | ICD-10-CM | POA: Diagnosis not present

## 2022-07-14 LAB — GLUCOSE, CAPILLARY
Glucose-Capillary: 104 mg/dL — ABNORMAL HIGH (ref 70–99)
Glucose-Capillary: 105 mg/dL — ABNORMAL HIGH (ref 70–99)
Glucose-Capillary: 111 mg/dL — ABNORMAL HIGH (ref 70–99)
Glucose-Capillary: 113 mg/dL — ABNORMAL HIGH (ref 70–99)
Glucose-Capillary: 124 mg/dL — ABNORMAL HIGH (ref 70–99)
Glucose-Capillary: 141 mg/dL — ABNORMAL HIGH (ref 70–99)
Glucose-Capillary: 148 mg/dL — ABNORMAL HIGH (ref 70–99)
Glucose-Capillary: 148 mg/dL — ABNORMAL HIGH (ref 70–99)
Glucose-Capillary: 183 mg/dL — ABNORMAL HIGH (ref 70–99)
Glucose-Capillary: 188 mg/dL — ABNORMAL HIGH (ref 70–99)
Glucose-Capillary: 282 mg/dL — ABNORMAL HIGH (ref 70–99)
Glucose-Capillary: 37 mg/dL — CL (ref 70–99)
Glucose-Capillary: 43 mg/dL — CL (ref 70–99)
Glucose-Capillary: 46 mg/dL — ABNORMAL LOW (ref 70–99)
Glucose-Capillary: 54 mg/dL — ABNORMAL LOW (ref 70–99)
Glucose-Capillary: 71 mg/dL (ref 70–99)
Glucose-Capillary: 91 mg/dL (ref 70–99)
Glucose-Capillary: 99 mg/dL (ref 70–99)

## 2022-07-14 LAB — BASIC METABOLIC PANEL
Anion gap: 15 (ref 5–15)
Anion gap: 7 (ref 5–15)
Anion gap: 8 (ref 5–15)
BUN: 61 mg/dL — ABNORMAL HIGH (ref 8–23)
BUN: 62 mg/dL — ABNORMAL HIGH (ref 8–23)
BUN: 65 mg/dL — ABNORMAL HIGH (ref 8–23)
CO2: 20 mmol/L — ABNORMAL LOW (ref 22–32)
CO2: 28 mmol/L (ref 22–32)
CO2: 29 mmol/L (ref 22–32)
Calcium: 8.8 mg/dL — ABNORMAL LOW (ref 8.9–10.3)
Calcium: 8.9 mg/dL (ref 8.9–10.3)
Calcium: 9.1 mg/dL (ref 8.9–10.3)
Chloride: 109 mmol/L (ref 98–111)
Chloride: 110 mmol/L (ref 98–111)
Chloride: 111 mmol/L (ref 98–111)
Creatinine, Ser: 2.14 mg/dL — ABNORMAL HIGH (ref 0.61–1.24)
Creatinine, Ser: 2.21 mg/dL — ABNORMAL HIGH (ref 0.61–1.24)
Creatinine, Ser: 2.55 mg/dL — ABNORMAL HIGH (ref 0.61–1.24)
GFR, Estimated: 26 mL/min — ABNORMAL LOW (ref >60)
GFR, Estimated: 31 mL/min — ABNORMAL LOW (ref >60)
GFR, Estimated: 33 mL/min — ABNORMAL LOW (ref >60)
Glucose, Bld: 131 mg/dL — ABNORMAL HIGH (ref 70–99)
Glucose, Bld: 164 mg/dL — ABNORMAL HIGH (ref 70–99)
Glucose, Bld: 347 mg/dL — ABNORMAL HIGH (ref 70–99)
Potassium: 3.3 mmol/L — ABNORMAL LOW (ref 3.5–5.1)
Potassium: 3.3 mmol/L — ABNORMAL LOW (ref 3.5–5.1)
Potassium: 3.8 mmol/L (ref 3.5–5.1)
Sodium: 144 mmol/L (ref 135–145)
Sodium: 146 mmol/L — ABNORMAL HIGH (ref 135–145)
Sodium: 147 mmol/L — ABNORMAL HIGH (ref 135–145)

## 2022-07-14 LAB — BETA-HYDROXYBUTYRIC ACID
Beta-Hydroxybutyric Acid: 0.12 mmol/L (ref 0.05–0.27)
Beta-Hydroxybutyric Acid: 0.83 mmol/L — ABNORMAL HIGH (ref 0.05–0.27)

## 2022-07-14 MED ORDER — POTASSIUM CHLORIDE CRYS ER 20 MEQ PO TBCR
40.0000 meq | EXTENDED_RELEASE_TABLET | Freq: Once | ORAL | Status: AC
  Administered 2022-07-14: 40 meq via ORAL
  Filled 2022-07-14: qty 2

## 2022-07-14 MED ORDER — INSULIN ASPART 100 UNIT/ML IJ SOLN: 0.0000 [IU] | Freq: Every day | INTRAMUSCULAR | Status: DC

## 2022-07-14 MED ORDER — AMLODIPINE BESYLATE 10 MG PO TABS
10.0000 mg | ORAL_TABLET | Freq: Every day | ORAL | Status: DC
  Administered 2022-07-14 – 2022-07-16 (×3): 10 mg via ORAL
  Filled 2022-07-14 (×3): qty 1

## 2022-07-14 MED ORDER — SODIUM CHLORIDE 0.45 % IV SOLN: INTRAVENOUS | Status: AC

## 2022-07-14 MED ORDER — INSULIN DETEMIR 100 UNIT/ML ~~LOC~~ SOLN
19.0000 [IU] | SUBCUTANEOUS | Status: DC
  Administered 2022-07-14: 19 [IU] via SUBCUTANEOUS
  Filled 2022-07-14: qty 0.19

## 2022-07-14 MED ORDER — INSULIN ASPART 100 UNIT/ML IJ SOLN
3.0000 [IU] | Freq: Three times a day (TID) | INTRAMUSCULAR | Status: DC
  Administered 2022-07-14: 3 [IU] via SUBCUTANEOUS
  Filled 2022-07-14: qty 1

## 2022-07-14 MED ORDER — INSULIN ASPART 100 UNIT/ML IJ SOLN
0.0000 [IU] | Freq: Three times a day (TID) | INTRAMUSCULAR | Status: DC
  Administered 2022-07-14: 1 [IU] via SUBCUTANEOUS
  Filled 2022-07-14: qty 1

## 2022-07-14 MED ORDER — NEPRO/CARBSTEADY PO LIQD
237.0000 mL | Freq: Three times a day (TID) | ORAL | Status: DC
  Administered 2022-07-14 – 2022-07-16 (×5): 237 mL via ORAL

## 2022-07-14 MED ORDER — INSULIN GLARGINE-YFGN 100 UNIT/ML ~~LOC~~ SOLN
15.0000 [IU] | Freq: Every day | SUBCUTANEOUS | Status: DC
  Administered 2022-07-15: 15 [IU] via SUBCUTANEOUS
  Filled 2022-07-14: qty 0.15

## 2022-07-14 MED ORDER — PANTOPRAZOLE SODIUM 40 MG PO TBEC
40.0000 mg | DELAYED_RELEASE_TABLET | Freq: Every day | ORAL | Status: DC
  Administered 2022-07-14 – 2022-07-16 (×3): 40 mg via ORAL
  Filled 2022-07-14 (×3): qty 1

## 2022-07-14 NOTE — Progress Notes (Signed)
PHARMACIST - PHYSICIAN COMMUNICATION  CONCERNING: IV to Oral Route Change Policy  RECOMMENDATION: This patient is receiving pantoprazole by the intravenous route.  Based on criteria approved by the Pharmacy and Therapeutics Committee, the intravenous medication(s) is/are being converted to the equivalent oral dose form(s).  DESCRIPTION: These criteria include: The patient is eating (either orally or via tube) and/or has been taking other orally administered medications for a least 24 hours The patient has no evidence of active gastrointestinal bleeding or impaired GI absorption (gastrectomy, short bowel, patient on TNA or NPO).  If you have questions about this conversion, please contact the Pharmacy Department   Tressie Ellis, [facility] 07/14/2022 12:07 PM

## 2022-07-14 NOTE — Progress Notes (Addendum)
Inpatient Diabetes Program Recommendations  AACE/ADA: New Consensus Statement on Inpatient Glycemic Control (2015)  Target Ranges:  Prepandial:   less than 140 mg/dL      Peak postprandial:   less than 180 mg/dL (1-2 hours)      Critically ill patients:  140 - 180 mg/dL   Lab Results  Component Value Date   GLUCAP 141 (H) 07/14/2022   HGBA1C 9.1 (H) 05/20/2022    Diabetes history: Type 1 DM- recent d/c just 3 days ago Outpatient Diabetes medications:  Tresiba 15 units daily, Novolog 0-9 units tid  Current orders for Inpatient glycemic control:  Levemir 19 units daily Novolog 0-9 units tid with meals Novolog 3 units tid with meals  Inpatient Diabetes Program Recommendations:     Patient has Type 1 DM and is well known to inpatient diabetes team due to multiple hospital admissions with DKA. Patient was last inpatient 4/11- 07/11/2022.  Per patient report in notes, he only took one dose of insulin after d/c due to nausea and not feeling well.   Patient is followed by Dr. Tedd Sias (Endocrinologist) and was last seen by her on 03/16/22.  Per office note on 03/16/22, patient was instructed to continue Tresiba 15 units daily and  IF EATING Take 3 unit of NovoLog if your sugar is 80 - 120 Take 5 unit of NovoLog if your sugar is 121-170 Take 7 units of NovoLog if your sugar is 171-220 Take 9 units of NovoLog if your sugar is 221-270 Take 10 units of NovoLog if your sugar is 271 or higher  IF NOT EATING (AND YOU HAVE NOT TAKEN NOVOLOG IN THE LAST 3 HOURS) Take 2 units of NovoLog if your sugar is 201-250 Take 4 units of NovoLog if your sugar is 251-300 Take 6 units of NovoLog if your sugar is 301 or higher   Will attempt to see patient today.  Of note patient is "very" sensitive to insulin doses and has history of both hyperglycemia and hypoglycemia in the hospital, therefore may need reduction in insulins.  Consider reducing Novolog correction to "very sensitive" 0-6 units and reduce meal  coverage to 2 units tid with meals.  Also consider d/c of Levemir and start Semglee 15 units daily (starting tomorrow on 07/15/22).    Addendum: 10:45 Attempted to speak with patient regarding DM.  He was sleeping soundly with covers over head.  Attempted to talk to him but he was too tired to talk.  Will follow.  Thanks,  Beryl Meager, RN, BC-ADM Inpatient Diabetes Coordinator Pager [phone]  (8a-5p)

## 2022-07-14 NOTE — Progress Notes (Signed)
Progress Note   Patient: Nicholas Weiss ZOX:[id number] DOB: 1951-02-16 DOA: 07/13/2022     1 DOS: the patient was seen and examined on 07/14/2022   Brief hospital course:  LARY ECKARDT is a 72 y.o. male with medical history significant for substance abuse, poorly controlled type 1 diabetes with complications of stage III chronic kidney disease, hypertension, orthostatic hypotension, history of CVA,  multiple myeloma, peptic ulcer disease, recent hospital discharge following an admission for DKA on 07/11/22 who returns to the emergency room via EMS for evaluation of " not feeling well".  According to the patient his blood sugars have been high and he only injected himself with insulin once since his discharge.  He states that he has not felt well and has been nauseous and so has not had anything to eat or drink.  Because of his poor oral intake he has not had any insulin.  He denies having any emesis but complains of feeling very weak. He denies having any chest pain, no shortness of breath, no headache, no abdominal pain, no changes in his bowel habits, no fever, no chills, no cough, no urinary symptoms, no focal deficits. Abnormal labs include potassium of 6.6, bicarb of 14, glucose 715, anion gap of 21 Twelve-lead EKG shows accelerated junctional rhythm.  Nonspecific intraventricular conduction delay. 2 L of IV fluid was ordered in the ER, calcium gluconate IV, insulin drip and albuterol. Patient will be admitted to the hospital for further evaluation     Assessment and Plan:  DKA (diabetic ketoacidosis) Patient with a history of type 1 diabetes mellitus and medication noncompliance  who was discharged from the hospital on 07/11/22 for DKA. He presents to the emergency room for evaluation of nausea, weakness and hypoglycemia and was found to have an anion gap metabolic acidosis with hyperkalemia and worsening of his renal function consistent with a DKA. Anion gap metabolic acidosis has  resolved and patient is currently off the insulin drip Started back on long-acting insulin with Premeal Humalog Diabetic education Will monitor blood sugars closely as patient's blood sugars are very labile       Acute metabolic encephalopathy Secondary to significant hyperglycemia and AKI Mental status has improved and patient appears to be back to baseline following resolution of hyperglycemia    Acute renal failure superimposed on stage 3a chronic kidney disease Patient noted to have an acute kidney injury secondary to GI losses from nausea and vomiting as well as osmotic diuresis. His baseline serum creatinine is 1.38 and on admission it was 2.36 with elevated BUN Some improvement in renal function but not back to baseline with creatinine of 2.14 Continue IV fluid hydration Avoid nephrotoxic agents   Hyperkalemia Most likely secondary to anion gap Metabolic acidosis with concomitant ACE inhibitor use. Hold lisinopril Potassium level have normalized   Anemia of chronic disease Monitor H&H closely   Essential (primary) hypertension Patient is normotensive Hold hydralazine   Cocaine abuse Patient has been counseled on the need to abstain from illicit drug   History of CVA (cerebrovascular accident) Continue aspirin, Plavix and high intensity statin       Subjective: Patient is seen and examined at the bedside.  He is awake and alert.  Physical Exam: Vitals:   07/14/22 1000 07/14/22 1100 07/14/22 1200 07/14/22 1300  BP: (!) 136/113 (!) 117/98 106/71 (!) 154/81  Pulse: 84 87 80 81  Resp: Temp:   98.2 F (36.8 C)   TempSrc:  Oral   SpO2: 100% 100% 100% 100%  Weight:      Height:       Vitals and nursing note reviewed.  Constitutional:      Comments: Awake HENT:     Head: Normocephalic and atraumatic.     Nose: Nose normal.     Mouth/Throat:     Mouth: Mucous membranes are dry.  Eyes:     Conjunctiva/sclera: Conjunctivae normal.   Cardiovascular:     Rate and Rhythm:  Pulmonary:     Effort: Pulmonary effort is normal.     Breath sounds: Normal breath sounds.  Abdominal:     General: Abdomen is flat. Bowel sounds are normal.     Palpations: Abdomen is soft.  Musculoskeletal:        General: Normal range of motion.     Cervical back: Normal range of motion and neck supple.  Skin:    General: Skin is warm and dry.  Neurological:     Motor: Weakness present.     Comments: Moves all extremities  Psychiatric:     Comments: Normal mood and affect    Data Reviewed: Labs reviewed.  Creatinine 2.14 compared to baseline of 1.38 There are no new results to review at this time.  Family Communication: No family at bedside.  Plan of care discussed with patient.  He verbalizes understanding and agrees with the plan  Disposition: Status is: Inpatient Remains inpatient appropriate because: Optimize glycemic control  Planned Discharge Destination: Home    Time spent: 35 minutes  Author: Lucile Shutters, MD 07/14/2022 2:51 PM  For on call review [email or web address].

## 2022-07-14 NOTE — Progress Notes (Signed)
       CROSS COVER NOTE  NAME: Nicholas Weiss MRN: [id number] DOB : 07-08-1950 ATTENDING PHYSICIAN: Lucile Shutters, MD   Notified by nursing they received ENDOTOOL alert that patient is to be transitioned off of insulin infusion.  Chart reviewed.  Gap is closed and beta-hydroxybutyrate level normalizing.    Will resume previous insulin regimen from most recent hospitalization. This will include 19U of basal insulin daily and 3 units before each meal.    Cardiac/Diabetic diet additionally started.  Insulin infusion to be stopped 2 hrs after administration of basal insulin administration.     Initiating accuchecks QAC and QHS with sliding scale insulin.    To reach the provider On-Call:   7AM- 7PM see care teams to locate the attending and reach out to them via [email or web address]. Password: [email or web address] 7PM-7AM contact night-coverage If you still have difficulty reaching the appropriate provider, please page the [facility] (Director on Call) for [facility] on amion for assistance  This document was prepared using Conservation officer, historic buildings and may include unintentional dictation errors.  Bishop Limbo DNP, MBA, FNP-BC, PMHNP-BC Nurse Practitioner [facility] Pager [phone]

## 2022-07-15 DIAGNOSIS — G9341 Metabolic encephalopathy: Secondary | ICD-10-CM | POA: Diagnosis not present

## 2022-07-15 DIAGNOSIS — E875 Hyperkalemia: Secondary | ICD-10-CM | POA: Diagnosis not present

## 2022-07-15 DIAGNOSIS — I10 Essential (primary) hypertension: Secondary | ICD-10-CM

## 2022-07-15 DIAGNOSIS — Z8673 Personal history of transient ischemic attack (TIA), and cerebral infarction without residual deficits: Secondary | ICD-10-CM

## 2022-07-15 DIAGNOSIS — N179 Acute kidney failure, unspecified: Secondary | ICD-10-CM

## 2022-07-15 DIAGNOSIS — E1010 Type 1 diabetes mellitus with ketoacidosis without coma: Secondary | ICD-10-CM | POA: Diagnosis not present

## 2022-07-15 DIAGNOSIS — D638 Anemia in other chronic diseases classified elsewhere: Secondary | ICD-10-CM

## 2022-07-15 DIAGNOSIS — N1831 Chronic kidney disease, stage 3a: Secondary | ICD-10-CM

## 2022-07-15 DIAGNOSIS — F1410 Cocaine abuse, uncomplicated: Secondary | ICD-10-CM

## 2022-07-15 LAB — GLUCOSE, CAPILLARY
Glucose-Capillary: 78 mg/dL (ref 70–99)
Glucose-Capillary: 61 mg/dL — ABNORMAL LOW (ref 70–99)
Glucose-Capillary: 106 mg/dL — ABNORMAL HIGH (ref 70–99)
Glucose-Capillary: 93 mg/dL (ref 70–99)
Glucose-Capillary: 159 mg/dL — ABNORMAL HIGH (ref 70–99)
Glucose-Capillary: 271 mg/dL — ABNORMAL HIGH (ref 70–99)
Glucose-Capillary: 386 mg/dL — ABNORMAL HIGH (ref 70–99)
Glucose-Capillary: 81 mg/dL (ref 70–99)

## 2022-07-15 LAB — CBC
HCT: 24 % — ABNORMAL LOW (ref 39.0–52.0)
Hemoglobin: 7.7 g/dL — ABNORMAL LOW (ref 13.0–17.0)
MCH: 30.6 pg (ref 26.0–34.0)
MCHC: 32.1 g/dL (ref 30.0–36.0)
MCV: 95.2 fL (ref 80.0–100.0)
Platelets: 217 10*3/uL (ref 150–400)
RBC: 2.52 MIL/uL — ABNORMAL LOW (ref 4.22–5.81)
RDW: 16.5 % — ABNORMAL HIGH (ref 11.5–15.5)
WBC: 5.8 10*3/uL (ref 4.0–10.5)
nRBC: 0 % (ref 0.0–0.2)

## 2022-07-15 LAB — BASIC METABOLIC PANEL
Anion gap: 6 (ref 5–15)
BUN: 45 mg/dL — ABNORMAL HIGH (ref 8–23)
CO2: 26 mmol/L (ref 22–32)
Calcium: 8.3 mg/dL — ABNORMAL LOW (ref 8.9–10.3)
Chloride: 105 mmol/L (ref 98–111)
Creatinine, Ser: 1.57 mg/dL — ABNORMAL HIGH (ref 0.61–1.24)
GFR, Estimated: 47 mL/min — ABNORMAL LOW (ref >60)
Glucose, Bld: 90 mg/dL (ref 70–99)
Potassium: 4.8 mmol/L (ref 3.5–5.1)
Sodium: 137 mmol/L (ref 135–145)

## 2022-07-15 LAB — MAGNESIUM: Magnesium: 2 mg/dL (ref 1.7–2.4)

## 2022-07-15 LAB — PHOSPHORUS: Phosphorus: 2.4 mg/dL — ABNORMAL LOW (ref 2.5–4.6)

## 2022-07-15 MED ORDER — INSULIN GLARGINE-YFGN 100 UNIT/ML ~~LOC~~ SOLN
12.0000 [IU] | Freq: Every day | SUBCUTANEOUS | Status: DC
  Administered 2022-07-16: 12 [IU] via SUBCUTANEOUS
  Filled 2022-07-15: qty 0.12

## 2022-07-15 MED ORDER — HEPARIN SODIUM (PORCINE) 5000 UNIT/ML IJ SOLN
5000.0000 [IU] | Freq: Three times a day (TID) | INTRAMUSCULAR | Status: DC
  Administered 2022-07-15 – 2022-07-16 (×3): 5000 [IU] via SUBCUTANEOUS
  Filled 2022-07-15 (×3): qty 1

## 2022-07-15 MED ORDER — INSULIN ASPART 100 UNIT/ML IJ SOLN
0.0000 [IU] | Freq: Three times a day (TID) | INTRAMUSCULAR | Status: DC
  Administered 2022-07-15: 5 [IU] via SUBCUTANEOUS
  Filled 2022-07-15: qty 1

## 2022-07-15 NOTE — Hospital Course (Addendum)
Nicholas Weiss is a 72 y.o. male with medical history significant for substance abuse, poorly controlled type 1 diabetes with complications of stage III chronic kidney disease, hypertension, orthostatic hypotension, history of CVA,  multiple myeloma, peptic ulcer disease, recent hospital discharge following an admission for DKA on 07/11/22 who returns to the emergency room via EMS for evaluation of " not feeling well". According to the patient his blood sugars have been high and he only injected himself with insulin once since his discharge.  04/15: back to ED as above. Potassium of 6.6, bicarb of 14, glucose 715, Cr 2.53, anion gap of 21. 2 L of IV fluid was ordered in the ER, calcium gluconate IV, insulin drip and albuterol. Patient was admitted to the hospital for further evaluation and tx DKA.  04/16: off insulin gtt early AM. Some improvement in renal function but not back to baseline, Cr at 2.14. Hypoglycemia, d/c SSI 04/17: Cr 1.57. Resume very sensitive SSI and reduced Semglee 04/18: Glc stable, pt left last night to get [employer] and Glc elevated.    Consultants:  none  Procedures: none      ASSESSMENT & PLAN:   Principal Problem:   DKA (diabetic ketoacidosis) Active Problems:   Acute renal failure superimposed on stage 3a chronic kidney disease   Acute metabolic encephalopathy   Hyperkalemia   Anemia of chronic disease   Essential (primary) hypertension   Cocaine abuse   History of CVA (cerebrovascular accident)  DKA (diabetic ketoacidosis) Patient with a history of type 1 diabetes mellitus and medication noncompliance  who was discharged from the hospital on 07/11/22 for DKA. Anion gap metabolic acidosis resolved  D/c insulin gtt 2 days ago and was started back on long-acting insulin with Premeal Humalog but this resulted in hypoglycemia SSI and basal adjusted Diabetic education Will monitor blood sugars closely as patient's blood sugars are very labile, high risk  readmission    Acute metabolic encephalopathy - improved Secondary to significant hyperglycemia and AKI Mental status has improved and patient appears to be back to baseline following resolution of hyperglycemia Glc control   Acute renal failure superimposed on stage 3a chronic kidney disease - resolved Restart lisinopril  Monitor BMP outpatient Encourage po hydration and Glc control    Hyperkalemia, resolved  Most likely secondary to anion gap Metabolic acidosis with concomitant ACE inhibitor use. Monitor BMP   Anemia of chronic disease Monitor H&H closely   Essential (primary) hypertension Restarted home lisinopril low dose Restarted home hydralazine lower dose   Cocaine abuse Patient has been counseled on the need to abstain from illicit drug   History of CVA (cerebrovascular accident) Continue aspirin, Plavix and high intensity statin       DVT prophylaxis: heparin Pertinent IV fluids/nutrition: no continuous IV fluids, diabetic diet  Central lines / invasive devices: none  Code Status: FULL CODE  ACP documents reviewed: none on file  Current Admission Status: inpatient   TOC needs / Dispo plan: TBD but anticipate home to previous environment  Barriers to discharge / significant pending items: stabilization of glucose on home regimen, anticipate d/c tomorrow if Glc remain appropriate

## 2022-07-15 NOTE — Progress Notes (Signed)
Upon entering pt's room at 2030 he was not found to be in the room. Security was called. Pt was found walking back into the hospital with a [employer] bag.   Pt stated "my brother picked me up and we went to [employer] and came back."  Pt assessment was unchanged and vitals within normal limits.    Provider notifications:  Writer: "Evening, pt was transferred to 1A from ICU today with a dx of DKA. Pt was just found walking back in the building with [employer]. He stated "my brother picked me up and we went to [employer] and came back, hasn't seen him in awhile." Pt has a history of polysubstance. Pt appears to not be altered at this time. Advise please"  [place], NP- "Follow the policy. Let me know the assessment findings."  Writer: "Vitals and assessment within normal limits. Pt denies taking anything."

## 2022-07-15 NOTE — Progress Notes (Addendum)
Inpatient Diabetes Program Recommendations  AACE/ADA: New Consensus Statement on Inpatient Glycemic Control (2015)  Target Ranges:  Prepandial:   less than 140 mg/dL      Peak postprandial:   less than 180 mg/dL (1-2 hours)      Critically ill patients:  140 - 180 mg/dL   Lab Results  Component Value Date   GLUCAP 271 (H) 07/15/2022   HGBA1C 9.1 (H) 05/20/2022    Review of Glycemic Control  Latest Reference Range & Units 07/14/22 16:19 07/14/22 17:32 07/14/22 22:02 07/15/22 01:23 07/15/22 04:44 07/15/22 06:17 07/15/22 07:29 07/15/22 09:11 07/15/22 11:29  Glucose-Capillary 70 - 99 mg/dL 54 (L) 829 (H) 99 78 61 (L) 106 (H) 93 159 (H) 271 (H)   Diabetes history: DM  Outpatient Diabetes medications:  Farxiga 10 mg daily Tresiba 15 units daily Novolog 0-9 units tid with meals  Current orders for Inpatient glycemic control:  Semglee 15 units daily  Inpatient Diabetes Program Recommendations:    Consider adding Novolog 0-6 units tid with meals and reduce Semglee to 12 units daily.    Note that patient was started on Farxiga 10 mg daily on 12/13/2020 and he continues to take per medication reconciliation-  Recommend d/c of this medication from home regimen due to increased risk for DKA (especially in patients with Type 1 DM).     Addendum:  1600- Spoke briefly with patient regarding NOT taking Farxiga due to increased risk for DKA.  Encouraged him to discuss with Dr. Lyn Hollingshead and his endocrinologist due to risk for DKA with Type 1 DM  (Discussed with Dr. Lyn Hollingshead and pharmacist as well).  Patient verbalized understanding.  Thanks, Beryl Meager, RN, BC-ADM Inpatient Diabetes Coordinator Pager [phone]  (8a-5p)

## 2022-07-15 NOTE — Progress Notes (Signed)
PROGRESS NOTE    Nicholas Weiss   ZOX:[id number] DOB: 09-27-1950  DOA: 07/13/2022 Date of Service: 07/15/22 PCP: Marisue Ivan, MD     Brief Narrative / Hospital Course:  Nicholas Weiss is a 72 y.o. male with medical history significant for substance abuse, poorly controlled type 1 diabetes with complications of stage III chronic kidney disease, hypertension, orthostatic hypotension, history of CVA,  multiple myeloma, peptic ulcer disease, recent hospital discharge following an admission for DKA on 07/11/22 who returns to the emergency room via EMS for evaluation of " not feeling well". According to the patient his blood sugars have been high and he only injected himself with insulin once since his discharge.  04/15: back to ED as above. Potassium of 6.6, bicarb of 14, glucose 715, Cr 2.53, anion gap of 21. 2 L of IV fluid was ordered in the ER, calcium gluconate IV, insulin drip and albuterol. Patient was admitted to the hospital for further evaluation and tx DKA.  04/16: off insulin gtt early AM. Some improvement in renal function but not back to baseline, Cr at 2.14. Hypoglycemia, d/c SSI 04/17: Cr 1.57. Resume very sensitive SSI and reduced Semglee   Consultants:  none  Procedures: none      ASSESSMENT & PLAN:   Principal Problem:   DKA (diabetic ketoacidosis) Active Problems:   Acute renal failure superimposed on stage 3a chronic kidney disease   Acute metabolic encephalopathy   Hyperkalemia   Anemia of chronic disease   Essential (primary) hypertension   Cocaine abuse   History of CVA (cerebrovascular accident)  DKA (diabetic ketoacidosis) Patient with a history of type 1 diabetes mellitus and medication noncompliance  who was discharged from the hospital on 07/11/22 for DKA. Anion gap metabolic acidosis resolved  D/c insulin gtt yesterday and was started back yesterday on long-acting insulin with Premeal Humalog Diabetic education Will monitor blood  sugars closely as patient's blood sugars are very labile   Acute metabolic encephalopathy - improved Secondary to significant hyperglycemia and AKI Mental status has improved and patient appears to be back to baseline following resolution of hyperglycemia Glc control   Acute renal failure superimposed on stage 3a chronic kidney disease Secondary to GI losses from nausea and vomiting as well as osmotic diuresis. His baseline serum creatinine is 1.38 and on admission it was 2.36 with elevated BUN Continue IV fluid hydration Avoid nephrotoxic agents   Hyperkalemia, resolved  Most likely secondary to anion gap Metabolic acidosis with concomitant ACE inhibitor use. Hold lisinopril Monitor BMP   Anemia of chronic disease Monitor H&H closely   Essential (primary) hypertension Patient is normotensive Hold hydralazine   Cocaine abuse Patient has been counseled on the need to abstain from illicit drug   History of CVA (cerebrovascular accident) Continue aspirin, Plavix and high intensity statin       DVT prophylaxis: heparin Pertinent IV fluids/nutrition: no continuous IV fluids, diabetic diet  Central lines / invasive devices: none  Code Status: FULL CODE   Current Admission Status: inpatient   TOC needs / Dispo plan: TBD but anticipate home to previous environment  Barriers to discharge / significant pending items: stabilization of glucose on home regimen, anticipate d/c tomorrow if Glc remain appropriate              Subjective / Brief ROS:  Patient reports no concerns today, overall feeling better "less dehydrated" Denies CP/SOB.  Pain controlled.  Denies new weakness.  Tolerating diet.  Reports no concerns w/  urination/defecation.   Family Communication: none at this time     Objective Findings:  Vitals:   07/15/22 1200 07/15/22 1300 07/15/22 1400 07/15/22 1500  BP: (!) 150/84 (!) 149/87 (!) 159/99 (!) 149/89  Pulse: 76 76 77 74  Resp: 15 17 11 11    Temp: 98.5 F (36.9 C)     TempSrc: Oral     SpO2: 100% 99% 98% 97%  Weight:      Height:        Intake/Output Summary (Last 24 hours) at 07/15/2022 1608 Last data filed at 07/15/2022 1500 Gross per 24 hour  Intake 1674.43 ml  Output 2275 ml  Net -600.57 ml   Filed Weights   07/13/22 1750 07/15/22 0458  Weight: 61.1 kg 64.5 kg    Examination:  Physical Exam Constitutional:      General: He is not in acute distress.    Appearance: Normal appearance. He is not ill-appearing.  Cardiovascular:     Rate and Rhythm: Normal rate and regular rhythm.  Pulmonary:     Effort: Pulmonary effort is normal.     Breath sounds: Normal breath sounds.  Abdominal:     General: Abdomen is flat.     Palpations: Abdomen is soft.  Skin:    General: Skin is warm.  Neurological:     General: No focal deficit present.     Mental Status: He is alert and oriented to person, place, and time. Mental status is at baseline.  Psychiatric:        Mood and Affect: Mood normal.        Behavior: Behavior normal.          Scheduled Medications:   amLODipine  10 mg Oral Daily   ascorbic acid  500 mg Oral Daily   aspirin EC  81 mg Oral Daily   atorvastatin  80 mg Oral QHS   Chlorhexidine Gluconate Cloth  6 each Topical Daily   clopidogrel  75 mg Oral Daily   feeding supplement (NEPRO CARB STEADY)  237 mL Oral TID BM   heparin injection (subcutaneous)  5,000 Units Subcutaneous Q8H   insulin aspart  0-6 Units Subcutaneous TID WC   [START ON 07/16/2022] insulin glargine-yfgn  12 Units Subcutaneous Daily   multivitamin with minerals  1 tablet Oral Daily   pantoprazole  40 mg Oral Daily    Continuous Infusions:   PRN Medications:  acetaminophen **OR** acetaminophen, dextrose, docusate sodium, mouth rinse  Antimicrobials from admission:  Anti-infectives (From admission, onward)    None           Data Reviewed:  I have personally reviewed the following...  CBC: Recent Labs  Lab  07/09/22 0256 07/09/22 0759 07/11/22 0446 07/13/22 1421 07/15/22 0509  WBC 5.1 6.0 3.3* 4.0 5.8  NEUTROABS 3.4  --   --   --   --   HGB 10.3* 9.6* 9.4* 9.8* 7.7*  HCT 33.4* 30.3* 29.2* 33.2* 24.0*  MCV 99.1 97.4 93.9 103.4* 95.2  PLT 237 226 210 251 217   Basic Metabolic Panel: Recent Labs  Lab 07/09/22 0256 07/09/22 0759 07/13/22 2205 07/14/22 0030 07/14/22 0432 07/14/22 0909 07/15/22 0509  NA 130*   < > 142 144 147* 146* 137  K 6.2*   < > 3.5 3.3* 3.3* 3.8 4.8  CL 96*   < > 106 109 111 110 105  CO2 18*   < > 18* 20* 29 28 26   GLUCOSE 730*   < >  511* 347* 131* 164* 90  BUN 79*   < > 66* 65* 62* 61* 45*  CREATININE 2.70*   < > 2.53* 2.55* 2.21* 2.14* 1.57*  CALCIUM 9.0   < > 8.8* 9.1 8.8* 8.9 8.3*  MG 2.5*  --   --   --   --   --  2.0  PHOS  --   --   --   --   --   --  2.4*   < > = values in this interval not displayed.   GFR: Estimated Creatinine Clearance: 40.5 mL/min (A) (by C-G formula based on SCr of 1.57 mg/dL (H)). Liver Function Tests: Recent Labs  Lab 07/09/22 0256 07/13/22 1523  AST 26 25  ALT 28 32  ALKPHOS 64 64  BILITOT 1.1 1.7*  PROT 9.1* 8.7*  ALBUMIN 3.5 3.4*   Recent Labs  Lab 07/13/22 1523  LIPASE 22   No results for input(s): "AMMONIA" in the last 168 hours. Coagulation Profile: No results for input(s): "INR", "PROTIME" in the last 168 hours. Cardiac Enzymes: No results for input(s): "CKTOTAL", "CKMB", "CKMBINDEX", "TROPONINI" in the last 168 hours. BNP (last 3 results) No results for input(s): "PROBNP" in the last 8760 hours. HbA1C: No results for input(s): "HGBA1C" in the last 72 hours. CBG: Recent Labs  Lab 07/15/22 0444 07/15/22 0617 07/15/22 0729 07/15/22 0911 07/15/22 1129  GLUCAP 61* 106* 93 159* 271*   Lipid Profile: No results for input(s): "CHOL", "HDL", "LDLCALC", "TRIG", "CHOLHDL", "LDLDIRECT" in the last 72 hours. Thyroid Function Tests: No results for input(s): "TSH", "T4TOTAL", "FREET4", "T3FREE",  "THYROIDAB" in the last 72 hours. Anemia Panel: No results for input(s): "VITAMINB12", "FOLATE", "FERRITIN", "TIBC", "IRON", "RETICCTPCT" in the last 72 hours. Most Recent Urinalysis On File:     Component Value Date/Time   COLORURINE YELLOW (A) 07/13/2022 1421   APPEARANCEUR CLEAR (A) 07/13/2022 1421   LABSPEC 1.021 07/13/2022 1421   PHURINE 5.0 07/13/2022 1421   GLUCOSEU >=500 (A) 07/13/2022 1421   HGBUR NEGATIVE 07/13/2022 1421   BILIRUBINUR NEGATIVE 07/13/2022 1421   KETONESUR 20 (A) 07/13/2022 1421   PROTEINUR NEGATIVE 07/13/2022 1421   NITRITE NEGATIVE 07/13/2022 1421   LEUKOCYTESUR NEGATIVE 07/13/2022 1421   Sepsis Labs: (procalcitonin:4,lacticidven:4) Microbiology: Recent Results (from the past 240 hour(s))  Resp panel by RT-PCR (RSV, Flu A&B, Covid) Urine, Clean Catch     Status: None   Collection Time: 07/09/22  3:08 AM   Specimen: Urine, Clean Catch; Nasal Swab  Result Value Ref Range Status   SARS Coronavirus 2 by RT PCR NEGATIVE NEGATIVE Final    Comment: (NOTE) SARS-CoV-2 target nucleic acids are NOT DETECTED.  The SARS-CoV-2 RNA is generally detectable in upper respiratory specimens during the acute phase of infection. The lowest concentration of SARS-CoV-2 viral copies this assay can detect is 138 copies/mL. A negative result does not preclude SARS-Cov-2 infection and should not be used as the sole basis for treatment or other patient management decisions. A negative result may occur with  improper specimen collection/handling, submission of specimen other than nasopharyngeal swab, presence of viral mutation(s) within the areas targeted by this assay, and inadequate number of viral copies(<138 copies/mL). A negative result must be combined with clinical observations, patient history, and epidemiological information. The expected result is Negative.  Fact Sheet for Patients:  BloggerCourse.com  Fact Sheet for Healthcare  Providers:  SeriousBroker.it  This test is no t yet approved or cleared by the [place] FDA and  has been  authorized for detection and/or diagnosis of SARS-CoV-2 by FDA under an Emergency Use Authorization (EUA). This EUA will remain  in effect (meaning this test can be used) for the duration of the COVID-19 declaration under Section 564(b)(1) of the Act, 21 U.S.C.section 360bbb-3(b)(1), unless the authorization is terminated  or revoked sooner.       Influenza A by PCR NEGATIVE NEGATIVE Final   Influenza B by PCR NEGATIVE NEGATIVE Final    Comment: (NOTE) The Xpert Xpress SARS-CoV-2/FLU/RSV plus assay is intended as an aid in the diagnosis of influenza from Nasopharyngeal swab specimens and should not be used as a sole basis for treatment. Nasal washings and aspirates are unacceptable for Xpert Xpress SARS-CoV-2/FLU/RSV testing.  Fact Sheet for Patients: BloggerCourse.com  Fact Sheet for Healthcare Providers: SeriousBroker.it  This test is not yet approved or cleared by the [place] FDA and has been authorized for detection and/or diagnosis of SARS-CoV-2 by FDA under an Emergency Use Authorization (EUA). This EUA will remain in effect (meaning this test can be used) for the duration of the COVID-19 declaration under Section 564(b)(1) of the Act, 21 U.S.C. section 360bbb-3(b)(1), unless the authorization is terminated or revoked.     Resp Syncytial Virus by PCR NEGATIVE NEGATIVE Final    Comment: (NOTE) Fact Sheet for Patients: BloggerCourse.com  Fact Sheet for Healthcare Providers: SeriousBroker.it  This test is not yet approved or cleared by the [place] FDA and has been authorized for detection and/or diagnosis of SARS-CoV-2 by FDA under an Emergency Use Authorization (EUA). This EUA will remain in effect (meaning this test can be  used) for the duration of the COVID-19 declaration under Section 564(b)(1) of the Act, 21 U.S.C. section 360bbb-3(b)(1), unless the authorization is terminated or revoked.  Performed at [facility], [address] [place], [place] [address]   MRSA Next Gen by PCR, Nasal     Status: Abnormal   Collection Time: 07/09/22 11:51 AM   Specimen: Nasal Mucosa; Nasal Swab  Result Value Ref Range Status   MRSA by PCR Next Gen DETECTED (A) NOT DETECTED Final    Comment: RESULT CALLED TO, READ BACK BY AND VERIFIED WITH: Barbaraann Share RN @1406  07/09/2022 [facility] (NOTE) The GeneXpert MRSA Assay (FDA approved for NASAL specimens only), is one component of a comprehensive MRSA colonization surveillance program. It is not intended to diagnose MRSA infection nor to guide or monitor treatment for MRSA infections. Test performance is not FDA approved in patients less than 38 years old. Performed at [facility], [address] [place], [place] [address]       Radiology Studies last 3 days: No results found.           LOS: 2 days     Sunnie Nielsen, DO [facility] 07/15/2022, 4:08 PM    Dictation software may have been used to generate the above note. Typos may occur and escape review in typed/dictated notes. Please contact Dr Lyn Hollingshead directly for clarity if needed.  Staff may message me via secure chat in Epic  but this may not receive an immediate response,  please page me for urgent matters!  If 7PM-7AM, please contact night coverage [email or web address]

## 2022-07-15 NOTE — Progress Notes (Signed)
Report was called and pt was transferred to room 159. Pts v/s are stable and pt has no c/o of pain or discomfort.

## 2022-07-16 DIAGNOSIS — E1010 Type 1 diabetes mellitus with ketoacidosis without coma: Secondary | ICD-10-CM | POA: Diagnosis not present

## 2022-07-16 DIAGNOSIS — E875 Hyperkalemia: Secondary | ICD-10-CM | POA: Diagnosis not present

## 2022-07-16 DIAGNOSIS — N179 Acute kidney failure, unspecified: Secondary | ICD-10-CM | POA: Diagnosis not present

## 2022-07-16 DIAGNOSIS — G9341 Metabolic encephalopathy: Secondary | ICD-10-CM | POA: Diagnosis not present

## 2022-07-16 LAB — CBC
HCT: 26.7 % — ABNORMAL LOW (ref 39.0–52.0)
Hemoglobin: 8.7 g/dL — ABNORMAL LOW (ref 13.0–17.0)
MCH: 30.6 pg (ref 26.0–34.0)
MCHC: 32.6 g/dL (ref 30.0–36.0)
MCV: 94 fL (ref 80.0–100.0)
Platelets: 214 10*3/uL (ref 150–400)
RBC: 2.84 MIL/uL — ABNORMAL LOW (ref 4.22–5.81)
RDW: 15.9 % — ABNORMAL HIGH (ref 11.5–15.5)
WBC: 4.4 10*3/uL (ref 4.0–10.5)
nRBC: 0 % (ref 0.0–0.2)

## 2022-07-16 LAB — BASIC METABOLIC PANEL
Anion gap: 8 (ref 5–15)
BUN: 36 mg/dL — ABNORMAL HIGH (ref 8–23)
CO2: 26 mmol/L (ref 22–32)
Calcium: 8.4 mg/dL — ABNORMAL LOW (ref 8.9–10.3)
Chloride: 99 mmol/L (ref 98–111)
Creatinine, Ser: 1.23 mg/dL (ref 0.61–1.24)
GFR, Estimated: >60 mL/min (ref >60)
Glucose, Bld: 247 mg/dL — ABNORMAL HIGH (ref 70–99)
Potassium: 4.7 mmol/L (ref 3.5–5.1)
Sodium: 133 mmol/L — ABNORMAL LOW (ref 135–145)

## 2022-07-16 LAB — GLUCOSE, CAPILLARY
Glucose-Capillary: 217 mg/dL — ABNORMAL HIGH (ref 70–99)
Glucose-Capillary: 256 mg/dL — ABNORMAL HIGH (ref 70–99)
Glucose-Capillary: 284 mg/dL — ABNORMAL HIGH (ref 70–99)

## 2022-07-16 LAB — MAGNESIUM: Magnesium: 1.7 mg/dL (ref 1.7–2.4)

## 2022-07-16 LAB — PHOSPHORUS: Phosphorus: 2.1 mg/dL — ABNORMAL LOW (ref 2.5–4.6)

## 2022-07-16 MED ORDER — TRESIBA FLEXTOUCH 100 UNIT/ML ~~LOC~~ SOPN
13.0000 [IU] | PEN_INJECTOR | Freq: Every day | SUBCUTANEOUS | Status: DC
Start: 2022-07-16 — End: 2022-08-26

## 2022-07-16 MED ORDER — LISINOPRIL 5 MG PO TABS
2.5000 mg | ORAL_TABLET | Freq: Every day | ORAL | Status: DC
  Administered 2022-07-16: 2.5 mg via ORAL
  Filled 2022-07-16: qty 1

## 2022-07-16 MED ORDER — INSULIN ASPART 100 UNIT/ML IJ SOLN: 0.0000 [IU] | Freq: Three times a day (TID) | INTRAMUSCULAR | Status: DC

## 2022-07-16 MED ORDER — HYDRALAZINE HCL 10 MG PO TABS: 10.0000 mg | ORAL_TABLET | Freq: Three times a day (TID) | ORAL | 0 refills | Status: DC

## 2022-07-16 MED ORDER — HYDRALAZINE HCL 10 MG PO TABS
10.0000 mg | ORAL_TABLET | Freq: Three times a day (TID) | ORAL | Status: DC
  Administered 2022-07-16: 10 mg via ORAL
  Filled 2022-07-16 (×2): qty 1

## 2022-07-16 MED ORDER — AMLODIPINE BESYLATE 10 MG PO TABS: 10.0000 mg | ORAL_TABLET | Freq: Every day | ORAL | 0 refills | Status: DC

## 2022-07-16 NOTE — Care Management Important Message (Signed)
Important Message  Patient Details  Name: Nicholas Weiss MRN: [id number] Date of Birth: [birth year]   Medicare Important Message Given:  Yes     Olegario Messier A Kadajah Kjos 07/16/2022, 11:36 AM

## 2022-07-16 NOTE — Plan of Care (Signed)

## 2022-07-17 ENCOUNTER — Inpatient Hospital Stay: Payer: 59

## 2022-07-17 ENCOUNTER — Inpatient Hospital Stay: Payer: 59 | Admitting: Oncology

## 2022-07-18 NOTE — Discharge Summary (Signed)
Physician Discharge Summary   Patient: Nicholas Weiss MRN: [id number]  DOB: 1950-05-28   Admit:     Date of Admission: 07/13/2022 Admitted from: home   Discharge: Date of discharge: 07/16/2022 Disposition: Home Condition at discharge: good  CODE STATUS: FULL CODE      Discharge Physician: Sunnie Nielsen, DO [facility]     PCP: Marisue Ivan, MD  Recommendations for Outpatient Follow-up:  Follow up with PCP Marisue Ivan, MD in 1-2 weeks Please obtain labs/tests: CBC, BMP, UA in 1-2 weeks Please follow up on the following pending results: none PCP AND OTHER OUTPATIENT PROVIDERS: SEE BELOW FOR SPECIFIC DISCHARGE INSTRUCTIONS PRINTED FOR PATIENT IN ADDITION TO GENERIC AVS PATIENT INFO    Discharge Instructions     Diet - low sodium heart healthy   Complete by: As directed    Diet Carb Modified   Complete by: As directed    Increase activity slowly   Complete by: As directed          Discharge Diagnoses: Principal Problem:   DKA (diabetic ketoacidosis) Active Problems:   Acute renal failure superimposed on stage 3a chronic kidney disease   Acute metabolic encephalopathy   Hyperkalemia   Anemia of chronic disease   Essential (primary) hypertension   Cocaine abuse   History of CVA (cerebrovascular accident)       Hospital Course: Nicholas Weiss is a 72 y.o. male with medical history significant for substance abuse, poorly controlled type 1 diabetes with complications of stage III chronic kidney disease, hypertension, orthostatic hypotension, history of CVA,  multiple myeloma, peptic ulcer disease, recent hospital discharge following an admission for DKA on 07/11/22 who returns to the emergency room via EMS for evaluation of " not feeling well". According to the patient his blood sugars have been high and he only injected himself with insulin once since his discharge.  04/15: back to ED as above. Potassium of 6.6, bicarb of 14,  glucose 715, Cr 2.53, anion gap of 21. 2 L of IV fluid was ordered in the ER, calcium gluconate IV, insulin drip and albuterol. Patient was admitted to the hospital for further evaluation and tx DKA.  04/16: off insulin gtt early AM. Some improvement in renal function but not back to baseline, Cr at 2.14. Hypoglycemia, d/c SSI 04/17: Cr 1.57. Resume very sensitive SSI and reduced Semglee 04/18: Glc stable, pt left last night to get [employer] and Glc elevated this am. Stable for discharge    Consultants:  none  Procedures: none      ASSESSMENT & PLAN:    DKA (diabetic ketoacidosis) Patient with a history of type 1 diabetes mellitus and medication noncompliance  who was discharged from the hospital on 07/11/22 for DKA and readmitted d/t noncompliance  Anion gap metabolic acidosis resolved  SSI and basal adjusted Diabetic education    Acute metabolic encephalopathy - improved Secondary to significant hyperglycemia and AKI Mental status has improved and patient appears to be back to baseline following resolution of hyperglycemia Glc control   Acute renal failure superimposed on stage 3a chronic kidney disease - resolved Restart lisinopril  Monitor BMP outpatient Encourage po hydration and Glc control    Hyperkalemia, resolved  Most likely secondary to anion gap Metabolic acidosis with concomitant ACE inhibitor use. Monitor BMP   Anemia of chronic disease Monitor H&H outpatient    Essential (primary) hypertension Restarted home lisinopril low dose Restarted home hydralazine lower dose   Cocaine abuse Patient has  been counseled on the need to abstain from illicit drugs   History of CVA (cerebrovascular accident) Continue aspirin, Plavix and high intensity statin             Discharge Instructions  Allergies as of 07/16/2022       Reactions   Penicillins Other (See Comments)   Childhood allergy -tolerated amoxil 03/2022 Unknown reaction Has patient had a  PCN reaction causing immediate rash, facial/tongue/throat swelling, SOB or lightheadedness with hypotension: No Has patient had a PCN reaction causing severe rash involving mucus membranes or skin necrosis: No Has patient had a PCN reaction that required hospitalization No Has patient had a PCN reaction occurring within the last 10 years: No If all of the above answers are "NO", then may proceed with Cephalosporin use.        Medication List     STOP taking these medications    dapagliflozin propanediol 10 MG Tabs tablet Commonly known as: FARXIGA       TAKE these medications    acetaminophen 500 MG tablet Commonly known as: TYLENOL Take 1,000 mg by mouth daily as needed for mild pain or moderate pain.   amLODipine 10 MG tablet Commonly known as: NORVASC Take 1 tablet (10 mg total) by mouth daily.   ascorbic acid 500 MG tablet Commonly known as: VITAMIN C Take 1 tablet (500 mg total) by mouth daily.   aspirin EC 81 MG tablet Take 81 mg by mouth in the morning.   atorvastatin 80 MG tablet Commonly known as: LIPITOR Take 1 tablet (80 mg total) by mouth at bedtime. Hold while taking Paxlovid   Baclofen 5 MG Tabs Take 5 mg by mouth daily as needed (muscle spasm).   clopidogrel 75 MG tablet Commonly known as: PLAVIX Take 75 mg by mouth in the morning.   docusate sodium 100 MG capsule Commonly known as: COLACE Take 100 mg by mouth daily as needed for mild constipation.   feeding supplement (GLUCERNA SHAKE) Liqd Take 237 mLs by mouth 3 (three) times daily between meals.   hydrALAZINE 10 MG tablet Commonly known as: APRESOLINE Take 1 tablet (10 mg total) by mouth every 8 (eight) hours. What changed:  medication strength how much to take   lisinopril 2.5 MG tablet Commonly known as: ZESTRIL Take 2.5 mg by mouth daily.   multivitamin with minerals Tabs tablet Take 1 tablet by mouth daily.   NovoLOG 100 UNIT/ML injection Generic drug: insulin aspart Sliding  Scale: Glucose 70 - 120: 0 units  Glucose 121 - 150: 1 unit  Glucose 151 - 200: 2 units Glucose 201 - 250: 3 units  Glucose 251 - 300: 5 units  Glucose 301 - 350: 7 units  Glucose 351 - 400: 9 units  Glucose > 400: call your docotr   ondansetron 8 MG tablet Commonly known as: ZOFRAN Take 8 mg by mouth daily as needed for nausea or vomiting.   pantoprazole 40 MG tablet Commonly known as: Protonix Take 1 tablet (40 mg total) by mouth 2 (two) times daily. Take twice daily for 2 months then decrease to once daily   Tresiba FlexTouch 100 UNIT/ML FlexTouch Pen Generic drug: insulin degludec Inject 13 Units into the skin daily. What changed: how much to take          Allergies  Allergen Reactions   Penicillins Other (See Comments)    Childhood allergy -tolerated amoxil 03/2022 Unknown reaction Has patient had a PCN reaction causing immediate rash, facial/tongue/throat swelling,  SOB or lightheadedness with hypotension: No Has patient had a PCN reaction causing severe rash involving mucus membranes or skin necrosis: No Has patient had a PCN reaction that required hospitalization No Has patient had a PCN reaction occurring within the last 10 years: No If all of the above answers are "NO", then may proceed with Cephalosporin use.      Subjective: pt reports no concerns this morning   Discharge Exam: BP 110/67   Pulse 82   Temp 98.9 F (37.2 C)   Resp 16   Ht 5\' 10"  (1.778 m)   Wt 66 kg   SpO2 100%   BMI 20.88 kg/m  General: Pt is alert, awake, not in acute distress Cardiovascular: RRR, S1/S2 +, no rubs, no gallops Respiratory: CTA bilaterally, no wheezing, no rhonchi Abdominal: Soft, NT, ND, bowel sounds + Extremities: no edema, no cyanosis     The results of significant diagnostics from this hospitalization (including imaging, microbiology, ancillary and laboratory) are listed below for reference.     Microbiology: Recent Results (from the past 240 hour(s))   Resp panel by RT-PCR (RSV, Flu A&B, Covid) Urine, Clean Catch     Status: None   Collection Time: 07/09/22  3:08 AM   Specimen: Urine, Clean Catch; Nasal Swab  Result Value Ref Range Status   SARS Coronavirus 2 by RT PCR NEGATIVE NEGATIVE Final    Comment: (NOTE) SARS-CoV-2 target nucleic acids are NOT DETECTED.  The SARS-CoV-2 RNA is generally detectable in upper respiratory specimens during the acute phase of infection. The lowest concentration of SARS-CoV-2 viral copies this assay can detect is 138 copies/mL. A negative result does not preclude SARS-Cov-2 infection and should not be used as the sole basis for treatment or other patient management decisions. A negative result may occur with  improper specimen collection/handling, submission of specimen other than nasopharyngeal swab, presence of viral mutation(s) within the areas targeted by this assay, and inadequate number of viral copies(<138 copies/mL). A negative result must be combined with clinical observations, patient history, and epidemiological information. The expected result is Negative.  Fact Sheet for Patients:  BloggerCourse.com  Fact Sheet for Healthcare Providers:  SeriousBroker.it  This test is no t yet approved or cleared by the [place] FDA and  has been authorized for detection and/or diagnosis of SARS-CoV-2 by FDA under an Emergency Use Authorization (EUA). This EUA will remain  in effect (meaning this test can be used) for the duration of the COVID-19 declaration under Section 564(b)(1) of the Act, 21 U.S.C.section 360bbb-3(b)(1), unless the authorization is terminated  or revoked sooner.       Influenza A by PCR NEGATIVE NEGATIVE Final   Influenza B by PCR NEGATIVE NEGATIVE Final    Comment: (NOTE) The Xpert Xpress SARS-CoV-2/FLU/RSV plus assay is intended as an aid in the diagnosis of influenza from Nasopharyngeal swab specimens and should  not be used as a sole basis for treatment. Nasal washings and aspirates are unacceptable for Xpert Xpress SARS-CoV-2/FLU/RSV testing.  Fact Sheet for Patients: BloggerCourse.com  Fact Sheet for Healthcare Providers: SeriousBroker.it  This test is not yet approved or cleared by the [place] FDA and has been authorized for detection and/or diagnosis of SARS-CoV-2 by FDA under an Emergency Use Authorization (EUA). This EUA will remain in effect (meaning this test can be used) for the duration of the COVID-19 declaration under Section 564(b)(1) of the Act, 21 U.S.C. section 360bbb-3(b)(1), unless the authorization is terminated or revoked.  Resp Syncytial Virus by PCR NEGATIVE NEGATIVE Final    Comment: (NOTE) Fact Sheet for Patients: BloggerCourse.com  Fact Sheet for Healthcare Providers: SeriousBroker.it  This test is not yet approved or cleared by the [place] FDA and has been authorized for detection and/or diagnosis of SARS-CoV-2 by FDA under an Emergency Use Authorization (EUA). This EUA will remain in effect (meaning this test can be used) for the duration of the COVID-19 declaration under Section 564(b)(1) of the Act, 21 U.S.C. section 360bbb-3(b)(1), unless the authorization is terminated or revoked.  Performed at [facility], [address] [place], [place] [address]   MRSA Next Gen by PCR, Nasal     Status: Abnormal   Collection Time: 07/09/22 11:51 AM   Specimen: Nasal Mucosa; Nasal Swab  Result Value Ref Range Status   MRSA by PCR Next Gen DETECTED (A) NOT DETECTED Final    Comment: RESULT CALLED TO, READ BACK BY AND VERIFIED WITH: Barbaraann Share RN @1406  07/09/2022 [facility] (NOTE) The GeneXpert MRSA Assay (FDA approved for NASAL specimens only), is one component of a comprehensive MRSA colonization surveillance program. It is not intended  to diagnose MRSA infection nor to guide or monitor treatment for MRSA infections. Test performance is not FDA approved in patients less than 60 years old. Performed at [facility], [address] [place], [place] [address]      Labs: BNP (last 3 results) No results for input(s): "BNP" in the last 8760 hours. Basic Metabolic Panel: Recent Labs  Lab 07/14/22 0030 07/14/22 0432 07/14/22 0909 07/15/22 0509 07/16/22 0523  NA 144 147* 146* 137 133*  K 3.3* 3.3* 3.8 4.8 4.7  CL 109 111 110 105 99  CO2 20* 29 28 26 26   GLUCOSE 347* 131* 164* 90 247*  BUN 65* 62* 61* 45* 36*  CREATININE 2.55* 2.21* 2.14* 1.57* 1.23  CALCIUM 9.1 8.8* 8.9 8.3* 8.4*  MG  --   --   --  2.0 1.7  PHOS  --   --   --  2.4* 2.1*   Liver Function Tests: Recent Labs  Lab 07/13/22 1523  AST 25  ALT 32  ALKPHOS 64  BILITOT 1.7*  PROT 8.7*  ALBUMIN 3.4*   Recent Labs  Lab 07/13/22 1523  LIPASE 22   No results for input(s): "AMMONIA" in the last 168 hours. CBC: Recent Labs  Lab 07/13/22 1421 07/15/22 0509 07/16/22 0523  WBC 4.0 5.8 4.4  HGB 9.8* 7.7* 8.7*  HCT 33.2* 24.0* 26.7*  MCV 103.4* 95.2 94.0  PLT 251 217 214   Cardiac Enzymes: No results for input(s): "CKTOTAL", "CKMB", "CKMBINDEX", "TROPONINI" in the last 168 hours. BNP: Invalid input(s): "POCBNP" CBG: Recent Labs  Lab 07/15/22 1733 07/15/22 2149 07/16/22 0250 07/16/22 0909 07/16/22 1208  GLUCAP 386* 81 217* 256* 284*   D-Dimer No results for input(s): "DDIMER" in the last 72 hours. Hgb A1c No results for input(s): "HGBA1C" in the last 72 hours. Lipid Profile No results for input(s): "CHOL", "HDL", "LDLCALC", "TRIG", "CHOLHDL", "LDLDIRECT" in the last 72 hours. Thyroid function studies No results for input(s): "TSH", "T4TOTAL", "T3FREE", "THYROIDAB" in the last 72 hours.  Invalid input(s): "FREET3" Anemia work up No results for input(s): "VITAMINB12", "FOLATE", "FERRITIN", "TIBC", "IRON", "RETICCTPCT"  in the last 72 hours. Urinalysis    Component Value Date/Time   COLORURINE YELLOW (A) 07/13/2022 1421   APPEARANCEUR CLEAR (A) 07/13/2022 1421   LABSPEC 1.021 07/13/2022 1421   PHURINE 5.0 07/13/2022 1421  GLUCOSEU >=500 (A) 07/13/2022 1421   HGBUR NEGATIVE 07/13/2022 1421   BILIRUBINUR NEGATIVE 07/13/2022 1421   KETONESUR 20 (A) 07/13/2022 1421   PROTEINUR NEGATIVE 07/13/2022 1421   NITRITE NEGATIVE 07/13/2022 1421   LEUKOCYTESUR NEGATIVE 07/13/2022 1421   Sepsis Labs Recent Labs  Lab 07/13/22 1421 07/15/22 0509 07/16/22 0523  WBC 4.0 5.8 4.4   Microbiology Recent Results (from the past 240 hour(s))  Resp panel by RT-PCR (RSV, Flu A&B, Covid) Urine, Clean Catch     Status: None   Collection Time: 07/09/22  3:08 AM   Specimen: Urine, Clean Catch; Nasal Swab  Result Value Ref Range Status   SARS Coronavirus 2 by RT PCR NEGATIVE NEGATIVE Final    Comment: (NOTE) SARS-CoV-2 target nucleic acids are NOT DETECTED.  The SARS-CoV-2 RNA is generally detectable in upper respiratory specimens during the acute phase of infection. The lowest concentration of SARS-CoV-2 viral copies this assay can detect is 138 copies/mL. A negative result does not preclude SARS-Cov-2 infection and should not be used as the sole basis for treatment or other patient management decisions. A negative result may occur with  improper specimen collection/handling, submission of specimen other than nasopharyngeal swab, presence of viral mutation(s) within the areas targeted by this assay, and inadequate number of viral copies(<138 copies/mL). A negative result must be combined with clinical observations, patient history, and epidemiological information. The expected result is Negative.  Fact Sheet for Patients:  BloggerCourse.com  Fact Sheet for Healthcare Providers:  SeriousBroker.it  This test is no t yet approved or cleared by the [place] FDA  and  has been authorized for detection and/or diagnosis of SARS-CoV-2 by FDA under an Emergency Use Authorization (EUA). This EUA will remain  in effect (meaning this test can be used) for the duration of the COVID-19 declaration under Section 564(b)(1) of the Act, 21 U.S.C.section 360bbb-3(b)(1), unless the authorization is terminated  or revoked sooner.       Influenza A by PCR NEGATIVE NEGATIVE Final   Influenza B by PCR NEGATIVE NEGATIVE Final    Comment: (NOTE) The Xpert Xpress SARS-CoV-2/FLU/RSV plus assay is intended as an aid in the diagnosis of influenza from Nasopharyngeal swab specimens and should not be used as a sole basis for treatment. Nasal washings and aspirates are unacceptable for Xpert Xpress SARS-CoV-2/FLU/RSV testing.  Fact Sheet for Patients: BloggerCourse.com  Fact Sheet for Healthcare Providers: SeriousBroker.it  This test is not yet approved or cleared by the [place] FDA and has been authorized for detection and/or diagnosis of SARS-CoV-2 by FDA under an Emergency Use Authorization (EUA). This EUA will remain in effect (meaning this test can be used) for the duration of the COVID-19 declaration under Section 564(b)(1) of the Act, 21 U.S.C. section 360bbb-3(b)(1), unless the authorization is terminated or revoked.     Resp Syncytial Virus by PCR NEGATIVE NEGATIVE Final    Comment: (NOTE) Fact Sheet for Patients: BloggerCourse.com  Fact Sheet for Healthcare Providers: SeriousBroker.it  This test is not yet approved or cleared by the [place] FDA and has been authorized for detection and/or diagnosis of SARS-CoV-2 by FDA under an Emergency Use Authorization (EUA). This EUA will remain in effect (meaning this test can be used) for the duration of the COVID-19 declaration under Section 564(b)(1) of the Act, 21 U.S.C. section 360bbb-3(b)(1),  unless the authorization is terminated or revoked.  Performed at [facility], [address] [place], [place] [address]   MRSA Next Gen by PCR, Nasal  Status: Abnormal   Collection Time: 07/09/22 11:51 AM   Specimen: Nasal Mucosa; Nasal Swab  Result Value Ref Range Status   MRSA by PCR Next Gen DETECTED (A) NOT DETECTED Final    Comment: RESULT CALLED TO, READ BACK BY AND VERIFIED WITH: Barbaraann Share RN @1406  07/09/2022 [facility] (NOTE) The GeneXpert MRSA Assay (FDA approved for NASAL specimens only), is one component of a comprehensive MRSA colonization surveillance program. It is not intended to diagnose MRSA infection nor to guide or monitor treatment for MRSA infections. Test performance is not FDA approved in patients less than 70 years old. Performed at [facility], [address] [place], [place] [address]    Imaging No results found.    Time coordinating discharge: over 30 minutes  SIGNED:  Sunnie Nielsen DO [facility]

## 2022-07-21 ENCOUNTER — Ambulatory Visit: Payer: 59 | Admitting: Oncology

## 2022-07-21 ENCOUNTER — Ambulatory Visit: Payer: 59

## 2022-07-21 ENCOUNTER — Other Ambulatory Visit: Payer: 59

## 2022-07-22 ENCOUNTER — Emergency Department
Admission: EM | Admit: 2022-07-22 | Discharge: 2022-07-22 | Disposition: A | Discharge status: Home / Self Care | Payer: 59 | Attending: Emergency Medicine | Admitting: Emergency Medicine

## 2022-07-22 ENCOUNTER — Other Ambulatory Visit: Payer: Self-pay

## 2022-07-22 DIAGNOSIS — E10649 Type 1 diabetes mellitus with hypoglycemia without coma: Secondary | ICD-10-CM | POA: Diagnosis not present

## 2022-07-22 DIAGNOSIS — Z794 Long term (current) use of insulin: Secondary | ICD-10-CM | POA: Insufficient documentation

## 2022-07-22 DIAGNOSIS — N183 Chronic kidney disease, stage 3 unspecified: Secondary | ICD-10-CM | POA: Diagnosis not present

## 2022-07-22 DIAGNOSIS — Z8673 Personal history of transient ischemic attack (TIA), and cerebral infarction without residual deficits: Secondary | ICD-10-CM | POA: Diagnosis not present

## 2022-07-22 DIAGNOSIS — N179 Acute kidney failure, unspecified: Secondary | ICD-10-CM | POA: Insufficient documentation

## 2022-07-22 DIAGNOSIS — E1022 Type 1 diabetes mellitus with diabetic chronic kidney disease: Secondary | ICD-10-CM | POA: Insufficient documentation

## 2022-07-22 DIAGNOSIS — E162 Hypoglycemia, unspecified: Secondary | ICD-10-CM

## 2022-07-22 DIAGNOSIS — I129 Hypertensive chronic kidney disease with stage 1 through stage 4 chronic kidney disease, or unspecified chronic kidney disease: Secondary | ICD-10-CM | POA: Insufficient documentation

## 2022-07-22 DIAGNOSIS — C9000 Multiple myeloma not having achieved remission: Secondary | ICD-10-CM | POA: Diagnosis not present

## 2022-07-22 LAB — CBG MONITORING, ED
Glucose-Capillary: 116 mg/dL — ABNORMAL HIGH (ref 70–99)
Glucose-Capillary: 318 mg/dL — ABNORMAL HIGH (ref 70–99)

## 2022-07-22 LAB — COMPREHENSIVE METABOLIC PANEL
ALT: 57 U/L — ABNORMAL HIGH (ref 0–44)
AST: 83 U/L — ABNORMAL HIGH (ref 15–41)
Albumin: 3.2 g/dL — ABNORMAL LOW (ref 3.5–5.0)
Alkaline Phosphatase: 68 U/L (ref 38–126)
Anion gap: 6 (ref 5–15)
BUN: 36 mg/dL — ABNORMAL HIGH (ref 8–23)
CO2: 24 mmol/L (ref 22–32)
Calcium: 8.5 mg/dL — ABNORMAL LOW (ref 8.9–10.3)
Chloride: 105 mmol/L (ref 98–111)
Creatinine, Ser: 1.69 mg/dL — ABNORMAL HIGH (ref 0.61–1.24)
GFR, Estimated: 43 mL/min — ABNORMAL LOW (ref >60)
Glucose, Bld: 135 mg/dL — ABNORMAL HIGH (ref 70–99)
Potassium: 4.3 mmol/L (ref 3.5–5.1)
Sodium: 135 mmol/L (ref 135–145)
Total Bilirubin: 0.4 mg/dL (ref 0.3–1.2)
Total Protein: 8.8 g/dL — ABNORMAL HIGH (ref 6.5–8.1)

## 2022-07-22 LAB — CBC WITH DIFFERENTIAL/PLATELET
Abs Immature Granulocytes: 0.01 10*3/uL (ref 0.00–0.07)
Basophils Absolute: 0 10*3/uL (ref 0.0–0.1)
Basophils Relative: 1 %
Eosinophils Absolute: 0.2 10*3/uL (ref 0.0–0.5)
Eosinophils Relative: 7 %
HCT: 29.2 % — ABNORMAL LOW (ref 39.0–52.0)
Hemoglobin: 9.2 g/dL — ABNORMAL LOW (ref 13.0–17.0)
Immature Granulocytes: 0 %
Lymphocytes Relative: 33 %
Lymphs Abs: 1.2 10*3/uL (ref 0.7–4.0)
MCH: 30.6 pg (ref 26.0–34.0)
MCHC: 31.5 g/dL (ref 30.0–36.0)
MCV: 97 fL (ref 80.0–100.0)
Monocytes Absolute: 0.5 10*3/uL (ref 0.1–1.0)
Monocytes Relative: 15 %
Neutro Abs: 1.6 10*3/uL — ABNORMAL LOW (ref 1.7–7.7)
Neutrophils Relative %: 44 %
Platelets: 251 10*3/uL (ref 150–400)
RBC: 3.01 MIL/uL — ABNORMAL LOW (ref 4.22–5.81)
RDW: 15.8 % — ABNORMAL HIGH (ref 11.5–15.5)
WBC: 3.6 10*3/uL — ABNORMAL LOW (ref 4.0–10.5)
nRBC: 0 % (ref 0.0–0.2)

## 2022-07-22 MED ORDER — INSULIN GLARGINE-YFGN 100 UNIT/ML ~~LOC~~ SOLN
13.0000 [IU] | Freq: Once | SUBCUTANEOUS | Status: AC
  Administered 2022-07-22: 13 [IU] via SUBCUTANEOUS
  Filled 2022-07-22: qty 0.13

## 2022-07-22 MED ORDER — SODIUM CHLORIDE 0.9 % IV BOLUS: 500.0000 mL | Freq: Once | INTRAVENOUS | Status: DC

## 2022-07-22 MED ORDER — "PEN NEEDLES 3/16"" 31G X 5 MM MISC": 1.0000 | Freq: Four times a day (QID) | 1 refills | Status: DC | PRN

## 2022-07-22 MED ORDER — BAQSIMI ONE PACK 3 MG/DOSE NA POWD: 3.0000 mg | Freq: Once | NASAL | 1 refills | Status: DC | PRN

## 2022-07-22 NOTE — ED Notes (Signed)
Pt given PO hydration.

## 2022-07-22 NOTE — Discharge Instructions (Addendum)
Nicholas Weiss go to 13 units.  Your kidney function was slightly elevated so drink plenty of fluids.  We have prescribed some glucagon to take as needed for low sugars but is important that you call your endocrine doctor and make a close follow-up.  Return To the ER for any other concerns.  USE THIS SLIDING SCALE:  IF EATING Take 3 unit of NovoLog if your sugar is 80 - 120 Take 5 unit of NovoLog if your sugar is 121-170 Take 7 units of NovoLog if your sugar is 171-220 Take 9 units of NovoLog if your sugar is 221-270 Take 10 units of NovoLog if your sugar is 271 or higher  IF NOT EATING (AND YOU HAVE NOT TAKEN NOVOLOG IN THE LAST 3 HOURS) Take 1 units of NovoLog if your sugar is 201-250 Take 2 units of NovoLog if your sugar is 251-300 Take 3 units of NovoLog if your sugar is 301 or higher

## 2022-07-22 NOTE — ED Provider Notes (Signed)
[facility] Provider Note    Event Date/Time   First MD Initiated Contact with Patient 07/22/22 0818     (approximate)   History   Hypoglycemia   HPI  Nicholas Weiss is a 72 y.o. male with history of substance abuse, poorly controlled type 1 diabetes, stage III CKD, hypertension, CVA, multiple myeloma who comes in with concerns for low blood sugar.  I reviewed the records from 07/13/2022 were patient was admitted for DKA his creatinine improved to 2.14 and DC'd at 1.57 -he had resumed his very sensitive sliding scale insulin.  Patient reports that last night his sugar was in the 300s and according to a sliding scale he was supposed to take 10 units of short acting insulin.  He reports eating and only taking 6 units.  He states that he knew that it was going to be too much and he told his partner that he would most likely need to be woken up due to low sugar.  This morning he was more altered and sugar was in the 20s so EMS initially gave oral glucose due to difficulties getting IV access and some orange juice.  His repeat sugar now is in the 100s.  Patient reports that he feels at his normal baseline self now.  He states that he can run around the room.  He denies any chest pain, shortness of breath or any other concerns  I reviewed patient's discharge summary where he is currently on   Tresiba FlexTouch 100 UNIT/ML FlexTouch Pen Generic drug: insulin degludec Inject 13 Units into the skin daily. What changed: how much to take    -NovoLOG 100 UNIT/ML injection Generic drug: insulin aspart Sliding Scale: Glucose 70 - 120: 0 units  Glucose 121 - 150: 1 unit  Glucose 151 - 200: 2 units Glucose 201 - 250: 3 units  Glucose 251 - 300: 5 units  Glucose 301 - 350: 7 units  Glucose 351 - 400: 9 units  Glucose > 400: call your docotr  STOP taking these medications     dapagliflozin propanediol 10 MG Tabs tablet Commonly known as: FARXIGA     Physical  Exam   Triage Vital Signs: ED Triage Vitals 07/22/22 0815  Enc Vitals Group     BP      Pulse Rate 73     Resp 12     Temp      Temp src      SpO2 100 %     Weight      Height      Head Circumference      Peak Flow      Pain Score      Pain Loc      Pain Edu?      Excl. in GC?     Most recent vital signs: Vitals:   07/22/22 0815  Pulse: 73  Resp: 12  SpO2: 100%     General: Awake, no distress.  CV:  Good peripheral perfusion.  Resp:  Normal effort.  Abd:  No distention.  Other:  Patient is well-appearing.  Denies any falls.  He has had no hematoma on his head he is got equal grip strength able to lift both legs up off the bed.  Abdomen soft nontender.   ED Results / Procedures / Treatments   Labs (all labs ordered are listed, but only abnormal results are displayed) Labs Reviewed  CBC WITH DIFFERENTIAL/PLATELET  COMPREHENSIVE METABOLIC PANEL  EKG  My interpretation of EKG:  Sinus vs junctional with a rate of 68 without any ST elevation or T wave inversions type -I reviewed his prior EKGs where it looks similar with potential junctional rhythm.  I discussed with Dr. Mariah Milling and thinks its Sinus with type 1 AV block.   PROCEDURES:  Critical Care performed: No  .1-3 Lead EKG Interpretation  Performed by: Concha Se, MD Authorized by: Concha Se, MD     Interpretation: normal     ECG rate:  73   ECG rate assessment: normal     Rhythm: sinus rhythm     Ectopy: none     Conduction: abnormal     Abnormal conduction: 1st degree AV block      MEDICATIONS ORDERED IN ED: Medications - No data to display   IMPRESSION / MDM / ASSESSMENT AND PLAN / ED COURSE  I reviewed the triage vital signs and the nursing notes.   Patient's presentation is most consistent with acute presentation with potential threat to life or bodily function.   Patient comes in with episode of hypoglycemia.  Sugar checked here is 116.  We went over how he is drawing  up his insulin to ensure that he was using the right type of syringe and does appear that he most likely did drop only 6 units.  Labs to be ordered to evaluate for worsening AKI.  Denies any chest pain, shortness of breath right other concerns.  He states that he feels at his baseline self now.  We did an EKG that does show sinus with a first-degree AV block I did confirm with cardiology Dr. Mariah Milling given how prolonged first-degree block was but patient is asymptomatic from this.   Patient CBC shows slightly low white count but similar to prior.  Hemoglobin is stable.  His CMP shows slightly increased creatinine.  Will give some slight fluid repletion.  I discussed with the diabetes coordinator team and they are going to send Hilda Lias down to discussed with patient given patient states that he is taking 15 units of Tresiba in the mornings of 13 units and is using an old sliding scale but even with a corrective sliding scale that I saw in the discharge summary it was still would have been too much with 7 units.  However he does report that he did this at 1130 at nighttime which I diabetes coordinator stated that he should not be taking short acting insulin right before bed like this.   I discussed with the diabetes coordinator Orlando Penner educated patient on when to take the insulin and how to take it based upon if you have been eating or not eating and also recommended to go down on the [place] to 13 units.  I given him this updated information also have prescribed some nasal glucagon if he has recurrent hypoglycemia episode.  He remains asymptomatic at this time and feels at his baseline self.  I recheck glucose  I considered admission but given he has been here and remained normoglycemic and diabetes coordinator has seen him and given him suggestions to prevent this in the future I feel like patient can be discharged back home  His creatinine was slightly elevated from 6 days ago but similar to 7 days ago.   BUN is nearly the same.  We recommended oral hydration giving difficulty getting IV access.  And not significantly elevated. Joseph Berkshire go to 13 units.  IF EATING Take 3 unit of NovoLog  if your sugar is 80 - 120 Take 5 unit of NovoLog if your sugar is 121-170 Take 7 units of NovoLog if your sugar is 171-220 Take 9 units of NovoLog if your sugar is 221-270 Take 10 units of NovoLog if your sugar is 271 or higher  IF NOT EATING (AND YOU HAVE NOT TAKEN NOVOLOG IN THE LAST 3 HOURS) Take 1 units of NovoLog if your sugar is 201-250 Take 2 units of NovoLog if your sugar is 251-300 Take 3 units of NovoLog if your sugar is 301 or higher     10:12 AM reevaluated patient he remains asymptomatic.  He understands that we are giving him his long-acting insulin now and then at lunchtime he is going to recheck his sugar and take his short acting insulin around lunch time which Kandice Hams agrees with. I d/w pharmacy and even though kidney function increasing no indication for holding his lisinopril- he understands to icnrease his PO intake.    The patient is on the cardiac monitor to evaluate for evidence of arrhythmia and/or significant heart rate changes.      FINAL CLINICAL IMPRESSION(S) / ED DIAGNOSES   Final diagnoses:  AKI (acute kidney injury)  Hypoglycemia     Rx / DC Orders   ED Discharge Orders     None        Note:  This document was prepared using Dragon voice recognition software and may include unintentional dictation errors.   Concha Se, MD 07/22/22 1016

## 2022-07-22 NOTE — Inpatient Diabetes Management (Signed)
Inpatient Diabetes Program Recommendations  AACE/ADA: New Consensus Statement on Inpatient Glycemic Control   Target Ranges:  Prepandial:   less than 140 mg/dL      Peak postprandial:   less than 180 mg/dL (1-2 hours)      Critically ill patients:  140 - 180 mg/dL    Latest Reference Range & Units 07/22/22 08:20  Glucose-Capillary 70 - 99 mg/dL 161 (H)    Latest Reference Range & Units 07/22/22 08:19  Glucose 70 - 99 mg/dL 096 (H)   Review of Glycemic Control  Diabetes history:  DM1 Outpatient Diabetes medications: Tresiba 15 units daily, Novolog 0-9 units TID Current orders for Inpatient glycemic control: None; in ED with hypoglycemia   Inpatient Diabetes Program Recommendations:     Outpatient DM: Please provide Rx for Baqsimi (nasal glucagon). Please decrease Tresiba to 13 units daily, adjust Novolog scale for when not eating to IF NOT EATING (AND YOU HAVE NOT TAKEN NOVOLOG IN THE LAST 4 HOURS) Take 1 units of NovoLog if your sugar is 201-250 Take 2 units of NovoLog if your sugar is 251-300 Take 3 units of NovoLog if your sugar is 301 or higher     NOTE: Patient in ED due to hypoglycemia. Per ED triage note, " Pt came in via ems from home due to a BG of 27. Pt given orange juice, oral glucose from ems and new BG 127. Pt states he took 6units insulin last night around midnight but was unsure if he drew up 6units correctly."  Patient is well known to inpatient DM team due to frequent hospital admissions.  Patient has Type 1 DM and is followed by Dr. Tedd Sias (Endocrinologist); last seen by her on 03/16/22. Per office note on 03/16/22, patient was instructed to continue Tresiba 15 units daily and  IF EATING Take 3 unit of NovoLog if your sugar is 80 - 120 Take 5 unit of NovoLog if your sugar is 121-170 Take 7 units of NovoLog if your sugar is 171-220 Take 9 units of NovoLog if your sugar is 221-270 Take 10 units of NovoLog if your sugar is 271 or higher  IF NOT EATING (AND YOU HAVE  NOT TAKEN NOVOLOG IN THE LAST 3 HOURS) Take 2 units of NovoLog if your sugar is 201-250 Take 4 units of NovoLog if your sugar is 251-300 Take 6 units of NovoLog if your sugar is 301 or higher    Spoke with patient at bedside. Patient reports that he is taking [place] 15 units daily (every morning) and Novolog when his sugar is over 300 mg/dl. Patient states he frequently forgets to take Novolog with meals and he typically does not take the Novolog unless his glucose is over 300 mg/dl. Patient states that his glucose was 330 mg/dl around 04:54 or 09:81 last night and he took Novolog 6 units (per scale from Dr. Tedd Sias as noted above). Patient states that he told his girlfriend last night to wake him up this morning so he could check his sugar because he was afraid that the Novolog 6 units would be too much and make his sugar drop to much. Patient reported that he has not eaten prior to checking his CBG last night and he had last used Novolog the morning of 07/21/22. Patient reports that his glucose is usually in the 80-90's in the mornings when he wakes up and goes up after eating meals because he forgets to take the Novolog before meals. Patient states he knows he is  suppose to take Novolog before meals but he keeps forgetting to take it. Discussed Novolog insulin and explained that he needs the Novolog before meals to cover carbs in the food he eats which could help prevent the spike in glucose after eating. Discussed not taking the Novolog any closer than 4 hours apart to prevent insulin stacking. Patient also notes that he has lost 15 pounds since last seeing Dr. Tedd Sias.  Patient states that he has an appointment with Dr. Tedd Sias in May 2024. Informed patient that per discharge summary on 07/16/22, he was suppose to take [place] 13 units daily and it would be recommended that he take Tresiba 13 units daily since fasting glucose is typically in 80-90's mg/dl.  Encouraged patient to take the Novolog before meals as  Dr. Tedd Sias has instructed and if he is not eating it would be recommended to decrease the Novolog correction scale. Inquired about any glucagon at home and patient states he is not aware of what glucagon is. Discussed glucagon and informed patient that it would be recommended that he be prescribed glucagon at discharge that could be used in case of severe hypoglycemia. Patient states he would prefer nasal glucagon. Encouraged patient to follow up with Dr. Tedd Sias as scheduled and to be sure to reach out to her office if he experiences any further issues with hypoglycemia. Patient verbalized understanding of information discussed and has no questions at this time. Discussed patient and recommendations with Dr. Fuller Plan in the ED.  Thanks, Orlando Penner, RN, MSN, CDCES Diabetes Coordinator Inpatient Diabetes Program [phone] (Team Pager from 8am to 5pm)

## 2022-07-22 NOTE — ED Triage Notes (Signed)
Pt came in via ems from home due to a BG of 27. Pt given orange juice, oral glucose from ems and new BG 127.  Pt states he took 6units insulin last night around midnight but was unsure if he drew up 6units correctly.

## 2022-07-28 ENCOUNTER — Ambulatory Visit: Payer: 59

## 2022-07-28 ENCOUNTER — Ambulatory Visit: Payer: 59 | Admitting: Oncology

## 2022-07-28 ENCOUNTER — Other Ambulatory Visit: Payer: 59

## 2022-07-29 ENCOUNTER — Ambulatory Visit: Payer: 59 | Admitting: Skilled Nursing Facility1

## 2022-07-31 ENCOUNTER — Inpatient Hospital Stay: Payer: 59 | Attending: Oncology

## 2022-07-31 ENCOUNTER — Other Ambulatory Visit: Payer: Self-pay

## 2022-07-31 ENCOUNTER — Other Ambulatory Visit: Payer: Self-pay | Admitting: *Deleted

## 2022-07-31 ENCOUNTER — Encounter: Payer: Self-pay | Admitting: Oncology

## 2022-07-31 ENCOUNTER — Inpatient Hospital Stay (HOSPITAL_BASED_OUTPATIENT_CLINIC_OR_DEPARTMENT_OTHER): Payer: 59 | Admitting: Oncology

## 2022-07-31 VITALS — BP 129/87 | HR 75 | Temp 93.0°F | Resp 18 | Ht 71.0 in | Wt 153.6 lb

## 2022-07-31 DIAGNOSIS — E1022 Type 1 diabetes mellitus with diabetic chronic kidney disease: Secondary | ICD-10-CM | POA: Insufficient documentation

## 2022-07-31 DIAGNOSIS — Z5112 Encounter for antineoplastic immunotherapy: Secondary | ICD-10-CM | POA: Insufficient documentation

## 2022-07-31 DIAGNOSIS — Z79899 Other long term (current) drug therapy: Secondary | ICD-10-CM | POA: Insufficient documentation

## 2022-07-31 DIAGNOSIS — D631 Anemia in chronic kidney disease: Secondary | ICD-10-CM | POA: Insufficient documentation

## 2022-07-31 DIAGNOSIS — Z794 Long term (current) use of insulin: Secondary | ICD-10-CM | POA: Insufficient documentation

## 2022-07-31 DIAGNOSIS — Z7902 Long term (current) use of antithrombotics/antiplatelets: Secondary | ICD-10-CM | POA: Diagnosis not present

## 2022-07-31 DIAGNOSIS — C9000 Multiple myeloma not having achieved remission: Secondary | ICD-10-CM | POA: Diagnosis not present

## 2022-07-31 DIAGNOSIS — N183 Chronic kidney disease, stage 3 unspecified: Secondary | ICD-10-CM | POA: Insufficient documentation

## 2022-07-31 DIAGNOSIS — I129 Hypertensive chronic kidney disease with stage 1 through stage 4 chronic kidney disease, or unspecified chronic kidney disease: Secondary | ICD-10-CM | POA: Insufficient documentation

## 2022-07-31 LAB — CBC WITH DIFFERENTIAL/PLATELET
Abs Immature Granulocytes: 0.01 10*3/uL (ref 0.00–0.07)
Basophils Absolute: 0 10*3/uL (ref 0.0–0.1)
Basophils Relative: 0 %
Eosinophils Absolute: 0.1 10*3/uL (ref 0.0–0.5)
Eosinophils Relative: 2 %
HCT: 26.1 % — ABNORMAL LOW (ref 39.0–52.0)
Hemoglobin: 8.3 g/dL — ABNORMAL LOW (ref 13.0–17.0)
Immature Granulocytes: 0 %
Lymphocytes Relative: 31 %
Lymphs Abs: 1.1 10*3/uL (ref 0.7–4.0)
MCH: 30.6 pg (ref 26.0–34.0)
MCHC: 31.8 g/dL (ref 30.0–36.0)
MCV: 96.3 fL (ref 80.0–100.0)
Monocytes Absolute: 0.3 10*3/uL (ref 0.1–1.0)
Monocytes Relative: 8 %
Neutro Abs: 2.1 10*3/uL (ref 1.7–7.7)
Neutrophils Relative %: 59 %
Platelets: 271 10*3/uL (ref 150–400)
RBC: 2.71 MIL/uL — ABNORMAL LOW (ref 4.22–5.81)
RDW: 15.6 % — ABNORMAL HIGH (ref 11.5–15.5)
WBC: 3.6 10*3/uL — ABNORMAL LOW (ref 4.0–10.5)
nRBC: 0 % (ref 0.0–0.2)

## 2022-07-31 LAB — COMPREHENSIVE METABOLIC PANEL
ALT: 39 U/L (ref 0–44)
AST: 27 U/L (ref 15–41)
Albumin: 3.2 g/dL — ABNORMAL LOW (ref 3.5–5.0)
Alkaline Phosphatase: 66 U/L (ref 38–126)
Anion gap: 6 (ref 5–15)
BUN: 31 mg/dL — ABNORMAL HIGH (ref 8–23)
CO2: 25 mmol/L (ref 22–32)
Calcium: 8.4 mg/dL — ABNORMAL LOW (ref 8.9–10.3)
Chloride: 102 mmol/L (ref 98–111)
Creatinine, Ser: 1.66 mg/dL — ABNORMAL HIGH (ref 0.61–1.24)
GFR, Estimated: 44 mL/min — ABNORMAL LOW (ref >60)
Glucose, Bld: 292 mg/dL — ABNORMAL HIGH (ref 70–99)
Potassium: 4.8 mmol/L (ref 3.5–5.1)
Sodium: 133 mmol/L — ABNORMAL LOW (ref 135–145)
Total Bilirubin: 0.4 mg/dL (ref 0.3–1.2)
Total Protein: 8.4 g/dL — ABNORMAL HIGH (ref 6.5–8.1)

## 2022-08-01 ENCOUNTER — Other Ambulatory Visit: Payer: Self-pay

## 2022-08-02 ENCOUNTER — Other Ambulatory Visit: Payer: Self-pay

## 2022-08-02 ENCOUNTER — Encounter: Payer: Self-pay | Admitting: Oncology

## 2022-08-02 NOTE — Progress Notes (Signed)
Hematology/Oncology Consult note [facility]  [phone] [phone]  Patient Care Team: Marisue Ivan, MD as PCP - General (Family Medicine) Pa, [facility] (Optometry) Creig Hines, MD as Consulting Physician (Oncology)   Name of the patient: Nicholas Weiss  [id number]  September 17, 1950   Date of visit: 08/02/22  Diagnosis-  high risk IgG lambda multiple myeloma R-ISS stage III     Chief complaint/ Reason for visit- discuss further management of multiple myeloma  Heme/Onc history: Patient is a 72 year old African-American male with a past medical history significant for uncontrolled type 1 diabetes, chronic kidney disease stage III chronic normocytic anemia referred for abnormal SPEP. Patient's creatinine has been fluctuating between 1.5-2 but about 5 months ago it went up all the way to 5 and his blood sugars were in the 1000 range. More recently his kidney numbers have been drifting back to normal values. As a part of the work-up he had serum protein electrophoresis done which showed an elevated gammaglobulin fraction with an M spike of 3%. The amount of M spike was not quantified in the specimen. He has therefore been referred to [place] for further management. Patient endorses chronic fatigue reports that his appetite and weight have remained stable   Results of blood work from 04/08/2020 were as follows: CBC showed white count of 2.9, H&H of 10.5/31.2 with an MCV of 91 and a platelet count of 191.  CMP showed a mildly elevated creatinine of 1.2 which was better as compared to 5 months ago when it was 3.9.  Total protein was mildly elevated at 9.1 calcium normal at 8.4 ferritin and iron studies B12 folate TSH and haptoglobin were normal.  Myeloma panel revealed an elevated IgG level of 06/04/2004 with an M protein of 3.1 g.  Immunofixation showed IgG lambda specificity.  Serum free light chain ratio was elevated at 25 and free light chain  lambda elevated at 370   Further myeloma work-up including a PET CT scan did not reveal any evidence of edematous lesions.  Bone marrow biopsy showed 23% plasma cells by manual count and 30% by CD138 IHC.  Normal cytogenetics.  FISH studies for myeloma showed gain of 1 q.  13 q-. detected.  P53 not detected.   He was treated for a year and has having smoldering multiple myeloma since both CKD and anemia have been stable for 3 to 4 years and could be secondary to uncontrolled diabetes.In June 2023 IgG levels increased to 5463 with an M protein of 4.1 g as compared to 2.6 g in September 2022.  Serum free light chain ratio remains around 25.  Therefore a repeat bone marrow biopsy was done which shows further increase in plasma cells ranging from 30 to 70% and by immunohistochemistry 60 to 70% of the cells were positive for CD138.  Cytogenetics normal.  FISH study showed 4: 14 translocation and gain of 1 q. making this high risk RISS stage III.  Repeat PET scan showed no lytic lesions   Patient is not a transplant candidate and was started on Velcade Revlimid dexamethasone regimen in July 2023.  Treatment complicated by repeated hospitalizations for uncontrolled blood sugars.  Interval history-patient recently got out of his hospitalization for diabetic ketoacidosis.  He presently feels well and denies any cocaine use.  He has chronic fatigue.  Denies any significant back pain  ECOG PS- 2 Pain scale- 0   Review of systems- Review of Systems  Constitutional:  Positive  for malaise/fatigue. Negative for chills, fever and weight loss.  HENT:  Negative for congestion, ear discharge and nosebleeds.   Eyes:  Negative for blurred vision.  Respiratory:  Negative for cough, hemoptysis, sputum production, shortness of breath and wheezing.   Cardiovascular:  Negative for chest pain, palpitations, orthopnea and claudication.  Gastrointestinal:  Negative for abdominal pain, blood in stool, constipation, diarrhea,  heartburn, melena, nausea and vomiting.  Genitourinary:  Negative for dysuria, flank pain, frequency, hematuria and urgency.  Musculoskeletal:  Negative for back pain, joint pain and myalgias.  Skin:  Negative for rash.  Neurological:  Negative for dizziness, tingling, focal weakness, seizures, weakness and headaches.  Endo/Heme/Allergies:  Does not bruise/bleed easily.  Psychiatric/Behavioral:  Negative for depression and suicidal ideas. The patient does not have insomnia.       Allergies  Allergen Reactions   Penicillins Other (See Comments)    Childhood allergy -tolerated amoxil 03/2022 Unknown reaction Has patient had a PCN reaction causing immediate rash, facial/tongue/throat swelling, SOB or lightheadedness with hypotension: No Has patient had a PCN reaction causing severe rash involving mucus membranes or skin necrosis: No Has patient had a PCN reaction that required hospitalization No Has patient had a PCN reaction occurring within the last 10 years: No If all of the above answers are "NO", then may proceed with Cephalosporin use.      Past Medical History:  Diagnosis Date   Acute metabolic encephalopathy 12/02/2020   DKA (diabetic ketoacidoses) 04/06/2016   Hypercholesteremia    Hypertension      Past Surgical History:  Procedure Laterality Date   AMPUTATION TOE Left 04/25/2022   Procedure: LEFT GREAT TOE AMPUTATION AND LEFT PARTIAL 2ND TOE AMPUTATION;  Surgeon: Felecia Shelling, DPM;  Location: [facility] ORS;  Service: Podiatry;  Laterality: Left;   COLONOSCOPY WITH PROPOFOL N/A 02/01/2020   Procedure: COLONOSCOPY WITH PROPOFOL;  Surgeon: Toney Reil, MD;  Location: [facility] ENDOSCOPY;  Service: Gastroenterology;  Laterality: N/A;   ESOPHAGOGASTRODUODENOSCOPY  02/01/2020   Procedure: ESOPHAGOGASTRODUODENOSCOPY (EGD);  Surgeon: Toney Reil, MD;  Location: [facility] ENDOSCOPY;  Service: Gastroenterology;;   ESOPHAGOGASTRODUODENOSCOPY (EGD) WITH PROPOFOL N/A 06/07/2022    Procedure: ESOPHAGOGASTRODUODENOSCOPY (EGD) WITH PROPOFOL;  Surgeon: Kerin Salen, MD;  Location: [facility] ENDOSCOPY;  Service: Gastroenterology;  Laterality: N/A;   KNEE SURGERY Right    Torn meniscus   KNEE SURGERY Left     Social History   Socioeconomic History   Marital status: Single    Spouse name: Not on file   Number of children: 3   Years of education: Not on file   Highest education level: High school graduate  Occupational History    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  Tobacco Use   Smoking status: Never   Smokeless tobacco: Never  Vaping Use   Vaping Use: Never used  Substance and Sexual Activity   Alcohol use: Not Currently    Alcohol/week: 0.0 - 1.0 standard drinks of alcohol    Comment: "once every 2 months"   Drug use: Yes    Types: Marijuana, "Crack" cocaine    Comment: last week   Sexual activity: Not Currently    Birth control/protection: None  Other Topics Concern   Not on file  Social History Narrative   Lives with girlfriend "sharon"   Social Determinants of Health   Financial Resource Strain: Low Risk  (01/01/2020)   Overall Financial Resource Strain (CARDIA)    Difficulty of Paying Living Expenses: Not hard at all  Food Insecurity:  No Food Insecurity (07/13/2022)   Hunger Vital Sign    Worried About Running Out of Food in the Last Year: Never true    Ran Out of Food in the Last Year: Never true  Transportation Needs: No Transportation Needs (07/13/2022)   PRAPARE - Administrator, Civil Service (Medical): No    Lack of Transportation (Non-Medical): No  Physical Activity: Insufficiently Active (01/01/2020)   Exercise Vital Sign    Days of Exercise per Week: 7 days    Minutes of Exercise per Session: 20 min  Stress: No Stress Concern Present (01/01/2020)   [employer] of Occupational Health - Occupational Stress Questionnaire    Feeling of Stress : Not at all  Social Connections: Moderately Isolated (01/01/2020)   Social Connection and  Isolation Panel [NHANES]    Frequency of Communication with Friends and Family: More than three times a week    Frequency of Social Gatherings with Friends and Family: More than three times a week    Attends Religious Services: Never    Database administrator or Organizations: No    Attends Banker Meetings: Never    Marital Status: Living with partner  Intimate Partner Violence: Not At Risk (07/13/2022)   Humiliation, Afraid, Rape, and Kick questionnaire    Fear of Current or Ex-Partner: No    Emotionally Abused: No    Physically Abused: No    Sexually Abused: No    Family History  Problem Relation Age of Onset   Heart attack Father    Hypertension Sister    Cancer Sister      Current Outpatient Medications:    acetaminophen (TYLENOL) 500 MG tablet, Take 1,000 mg by mouth daily as needed for mild pain or moderate pain., Disp: , Rfl:    amLODipine (NORVASC) 10 MG tablet, Take 1 tablet (10 mg total) by mouth daily., Disp: 30 tablet, Rfl: 0   ascorbic acid (VITAMIN C) 500 MG tablet, Take 1 tablet (500 mg total) by mouth daily., Disp: 90 tablet, Rfl: 0   aspirin EC 81 MG tablet, Take 81 mg by mouth in the morning., Disp: , Rfl:    atorvastatin (LIPITOR) 80 MG tablet, Take 1 tablet (80 mg total) by mouth at bedtime. Hold while taking Paxlovid, Disp: 30 tablet, Rfl: 2   Baclofen 5 MG TABS, Take 5 mg by mouth daily as needed (muscle spasm)., Disp: , Rfl:    clopidogrel (PLAVIX) 75 MG tablet, Take 75 mg by mouth in the morning., Disp: , Rfl:    Continuous Glucose Sensor (DEXCOM G7 SENSOR) MISC, Use 1 each every 10 (ten) days, Disp: , Rfl:    docusate sodium (COLACE) 100 MG capsule, Take 100 mg by mouth daily as needed for mild constipation., Disp: , Rfl:    feeding supplement, GLUCERNA SHAKE, (GLUCERNA SHAKE) LIQD, Take 237 mLs by mouth 3 (three) times daily between meals., Disp: , Rfl: 0   Glucagon (BAQSIMI ONE PACK) 3 MG/DOSE POWD, Place 3 mg into the nose once as needed for  up to 1 dose (for low sugar)., Disp: 1 each, Rfl: 1   hydrALAZINE (APRESOLINE) 10 MG tablet, Take 1 tablet (10 mg total) by mouth every 8 (eight) hours., Disp: 90 tablet, Rfl: 0   insulin degludec (TRESIBA FLEXTOUCH) 100 UNIT/ML FlexTouch Pen, Inject 13 Units into the skin daily., Disp: , Rfl:    Insulin Pen Needle (PEN NEEDLES 3/16") 31G X 5 MM MISC, 1 Needle by Does not apply  route 4 (four) times daily as needed., Disp: 1000 each, Rfl: 1   lisinopril (ZESTRIL) 2.5 MG tablet, Take 2.5 mg by mouth daily., Disp: , Rfl:    Multiple Vitamin (MULTIVITAMIN WITH MINERALS) TABS tablet, Take 1 tablet by mouth daily., Disp: 90 tablet, Rfl: 1   NOVOLOG 100 UNIT/ML injection, Sliding Scale: Glucose 70 - 120: 0 units  Glucose 121 - 150: 1 unit  Glucose 151 - 200: 2 units Glucose 201 - 250: 3 units  Glucose 251 - 300: 5 units  Glucose 301 - 350: 7 units  Glucose 351 - 400: 9 units  Glucose > 400: call your docotr, Disp: 10 mL, Rfl: 1   ondansetron (ZOFRAN) 8 MG tablet, Take 8 mg by mouth daily as needed for nausea or vomiting., Disp: , Rfl:    pantoprazole (PROTONIX) 40 MG tablet, Take 1 tablet (40 mg total) by mouth 2 (two) times daily. Take twice daily for 2 months then decrease to once daily, Disp: , Rfl:   Physical exam:  Vitals:   07/31/22 1132  BP: 129/87  Pulse: 75  Resp: 18  Temp: (!) 93 F (33.9 C)  TempSrc: Tympanic  SpO2: 100%  Weight: 153 lb 9.6 oz (69.7 kg)  Height: 5\' 11"  (1.803 m)   Physical Exam Constitutional:      General: He is not in acute distress. Cardiovascular:     Rate and Rhythm: Normal rate and regular rhythm.     Heart sounds: Normal heart sounds.  Pulmonary:     Effort: Pulmonary effort is normal.     Breath sounds: Normal breath sounds.  Abdominal:     General: Bowel sounds are normal.     Palpations: Abdomen is soft.  Skin:    General: Skin is warm and dry.  Neurological:     Mental Status: He is alert and oriented to person, place, and time.          Latest Ref Rng & Units 07/31/2022   11:09 AM  CMP  Glucose 70 - 99 mg/dL 161   BUN 8 - 23 mg/dL 31   Creatinine 0.96 - 1.24 mg/dL 0.45   Sodium 409 - 811 mmol/L 133   Potassium 3.5 - 5.1 mmol/L 4.8   Chloride 98 - 111 mmol/L 102   CO2 22 - 32 mmol/L 25   Calcium 8.9 - 10.3 mg/dL 8.4   Total Protein 6.5 - 8.1 g/dL 8.4   Total Bilirubin 0.3 - 1.2 mg/dL 0.4   Alkaline Phos 38 - 126 U/L 66   AST 15 - 41 U/L 27   ALT 0 - 44 U/L 39       Latest Ref Rng & Units 07/31/2022   11:09 AM  CBC  WBC 4.0 - 10.5 K/uL 3.6   Hemoglobin 13.0 - 17.0 g/dL 8.3   Hematocrit 91.4 - 52.0 % 26.1   Platelets 150 - 400 K/uL 271     No images are attached to the encounter.  DG Chest Portable 1 View  Result Date: 07/09/2022 CLINICAL DATA:  Weakness EXAM: PORTABLE CHEST 1 VIEW COMPARISON:  04/21/2022 FINDINGS: Cardiac shadow is within normal limits. Lungs are well aerated bilaterally. Skin fold is noted over the right chest. No pneumothorax is seen. An old left ninth rib fracture is noted without complicating factors. IMPRESSION: No acute abnormality noted. Electronically Signed   By: Alcide Clever M.D.   On: 07/09/2022 03:22     Assessment and plan- Patient is a 72 y.o.  male  with R-ISS stage III high risk IgG lambda multiple myeloma.   He is here for further discussion of multiple myeloma  Patient was previously on VRD regimen for his multiple myeloma and had completed 7 cycles.  He has not been able to receive any treatment since January 2024 due to Periodic hospitalizations for diabetic ketoacidosis as well as hypoglycemia.  I have repeated his myeloma panel and serum free light chains at this time.  I would like to add Darzalex to VRD regimen given that he had not achieved a great response previously.  Patient will proceed with D VRD cycle 1 next week and I will see him back in 2 weeks for cycle 1 day 8.  He will start taking Revlimid on day one of his cycle 1.  Continue aspirin prophylaxis along with  acyclovir.  He will also resume Retacrit for anemia of chronic kidney disease next week   Visit Diagnosis 1. Multiple myeloma not having achieved remission (HCC)      Dr. Owens Shark, MD, MPH [facility] [phone] 08/02/2022 6:35 PM

## 2022-08-03 LAB — KAPPA/LAMBDA LIGHT CHAINS
Kappa free light chain: 11.3 mg/L (ref 3.3–19.4)
Kappa, lambda light chain ratio: 0.08 — ABNORMAL LOW (ref 0.26–1.65)
Lambda free light chains: 137 mg/L — ABNORMAL HIGH (ref 5.7–26.3)

## 2022-08-04 ENCOUNTER — Other Ambulatory Visit: Payer: 59

## 2022-08-04 ENCOUNTER — Other Ambulatory Visit: Payer: Self-pay | Admitting: Oncology

## 2022-08-04 ENCOUNTER — Ambulatory Visit: Payer: 59

## 2022-08-04 ENCOUNTER — Ambulatory Visit: Payer: 59 | Admitting: Oncology

## 2022-08-04 DIAGNOSIS — C9000 Multiple myeloma not having achieved remission: Secondary | ICD-10-CM

## 2022-08-06 ENCOUNTER — Other Ambulatory Visit: Payer: Self-pay | Admitting: Pharmacist

## 2022-08-06 ENCOUNTER — Inpatient Hospital Stay: Payer: 59

## 2022-08-06 VITALS — BP 133/76 | HR 82 | Temp 95.0°F | Resp 17 | Wt 153.5 lb

## 2022-08-06 DIAGNOSIS — C9000 Multiple myeloma not having achieved remission: Secondary | ICD-10-CM

## 2022-08-06 DIAGNOSIS — Z5112 Encounter for antineoplastic immunotherapy: Secondary | ICD-10-CM | POA: Diagnosis not present

## 2022-08-06 LAB — CBC WITH DIFFERENTIAL/PLATELET
Abs Immature Granulocytes: 0.02 10*3/uL (ref 0.00–0.07)
Basophils Absolute: 0 10*3/uL (ref 0.0–0.1)
Basophils Relative: 1 %
Eosinophils Absolute: 0.1 10*3/uL (ref 0.0–0.5)
Eosinophils Relative: 2 %
HCT: 26.5 % — ABNORMAL LOW (ref 39.0–52.0)
Hemoglobin: 8.3 g/dL — ABNORMAL LOW (ref 13.0–17.0)
Immature Granulocytes: 1 %
Lymphocytes Relative: 33 %
Lymphs Abs: 1 10*3/uL (ref 0.7–4.0)
MCH: 30.4 pg (ref 26.0–34.0)
MCHC: 31.3 g/dL (ref 30.0–36.0)
MCV: 97.1 fL (ref 80.0–100.0)
Monocytes Absolute: 0.4 10*3/uL (ref 0.1–1.0)
Monocytes Relative: 11 %
Neutro Abs: 1.7 10*3/uL (ref 1.7–7.7)
Neutrophils Relative %: 52 %
Platelets: 269 10*3/uL (ref 150–400)
RBC: 2.73 MIL/uL — ABNORMAL LOW (ref 4.22–5.81)
RDW: 15.8 % — ABNORMAL HIGH (ref 11.5–15.5)
WBC: 3.2 10*3/uL — ABNORMAL LOW (ref 4.0–10.5)
nRBC: 0 % (ref 0.0–0.2)

## 2022-08-06 LAB — COMPREHENSIVE METABOLIC PANEL
ALT: 25 U/L (ref 0–44)
AST: 20 U/L (ref 15–41)
Albumin: 3.5 g/dL (ref 3.5–5.0)
Alkaline Phosphatase: 55 U/L (ref 38–126)
Anion gap: 9 (ref 5–15)
BUN: 32 mg/dL — ABNORMAL HIGH (ref 8–23)
CO2: 23 mmol/L (ref 22–32)
Calcium: 8.5 mg/dL — ABNORMAL LOW (ref 8.9–10.3)
Chloride: 103 mmol/L (ref 98–111)
Creatinine, Ser: 1.59 mg/dL — ABNORMAL HIGH (ref 0.61–1.24)
GFR, Estimated: 47 mL/min — ABNORMAL LOW (ref >60)
Glucose, Bld: 297 mg/dL — ABNORMAL HIGH (ref 70–99)
Potassium: 5 mmol/L (ref 3.5–5.1)
Sodium: 135 mmol/L (ref 135–145)
Total Bilirubin: 0.4 mg/dL (ref 0.3–1.2)
Total Protein: 8.5 g/dL — ABNORMAL HIGH (ref 6.5–8.1)

## 2022-08-06 LAB — TYPE AND SCREEN
ABO/RH(D): A POS
Antibody Screen: NEGATIVE

## 2022-08-06 MED ORDER — DEXAMETHASONE 4 MG PO TABS
10.0000 mg | ORAL_TABLET | Freq: Once | ORAL | Status: AC
  Administered 2022-08-06: 10 mg via ORAL
  Filled 2022-08-06: qty 3

## 2022-08-06 MED ORDER — ACYCLOVIR 400 MG PO TABS
400.0000 mg | ORAL_TABLET | Freq: Two times a day (BID) | ORAL | 2 refills | Status: DC
Start: 2022-08-06 — End: 2023-02-24

## 2022-08-06 MED ORDER — DIPHENHYDRAMINE HCL 25 MG PO CAPS
50.0000 mg | ORAL_CAPSULE | Freq: Once | ORAL | Status: AC
  Administered 2022-08-06: 50 mg via ORAL
  Filled 2022-08-06: qty 2

## 2022-08-06 MED ORDER — BORTEZOMIB CHEMO SQ INJECTION 3.5 MG (2.5MG/ML)
1.3000 mg/m2 | Freq: Once | INTRAMUSCULAR | Status: AC
  Administered 2022-08-06: 2.5 mg via SUBCUTANEOUS
  Filled 2022-08-06: qty 1

## 2022-08-06 MED ORDER — DARATUMUMAB-HYALURONIDASE-FIHJ 1800-30000 MG-UT/15ML ~~LOC~~ SOLN
1800.0000 mg | Freq: Once | SUBCUTANEOUS | Status: AC
  Administered 2022-08-06: 1800 mg via SUBCUTANEOUS
  Filled 2022-08-06: qty 15

## 2022-08-06 MED ORDER — MONTELUKAST SODIUM 10 MG PO TABS
10.0000 mg | ORAL_TABLET | Freq: Once | ORAL | Status: AC
  Administered 2022-08-06: 10 mg via ORAL
  Filled 2022-08-06: qty 1

## 2022-08-06 MED ORDER — ACETAMINOPHEN 325 MG PO TABS
650.0000 mg | ORAL_TABLET | Freq: Once | ORAL | Status: AC
  Administered 2022-08-06: 650 mg via ORAL
  Filled 2022-08-06: qty 2

## 2022-08-06 NOTE — Progress Notes (Signed)
Cr 1.59 ok to proceed per MD

## 2022-08-07 LAB — MULTIPLE MYELOMA PANEL, SERUM
Albumin SerPl Elph-Mcnc: 3.3 g/dL (ref 2.9–4.4)
Albumin/Glob SerPl: 0.7 (ref 0.7–1.7)
Alpha 1: 0.2 g/dL (ref 0.0–0.4)
Alpha2 Glob SerPl Elph-Mcnc: 0.7 g/dL (ref 0.4–1.0)
B-Globulin SerPl Elph-Mcnc: 0.8 g/dL (ref 0.7–1.3)
Gamma Glob SerPl Elph-Mcnc: 3.3 g/dL — ABNORMAL HIGH (ref 0.4–1.8)
Globulin, Total: 5 g/dL — ABNORMAL HIGH (ref 2.2–3.9)
IgA: 31 mg/dL — ABNORMAL LOW (ref 61–437)
IgG (Immunoglobin G), Serum: 3986 mg/dL — ABNORMAL HIGH (ref 603–1613)
IgM (Immunoglobulin M), Srm: <5 mg/dL — ABNORMAL LOW (ref 20–172)
M Protein SerPl Elph-Mcnc: 3 g/dL — ABNORMAL HIGH
Total Protein ELP: 8.3 g/dL (ref 6.0–8.5)

## 2022-08-10 LAB — PRETREATMENT RBC PHENOTYPE: DAT, IgG: NEGATIVE

## 2022-08-11 ENCOUNTER — Other Ambulatory Visit: Payer: 59

## 2022-08-11 ENCOUNTER — Ambulatory Visit: Payer: 59

## 2022-08-11 ENCOUNTER — Ambulatory Visit: Payer: 59 | Admitting: Oncology

## 2022-08-14 ENCOUNTER — Inpatient Hospital Stay: Payer: 59

## 2022-08-18 ENCOUNTER — Other Ambulatory Visit: Payer: Self-pay

## 2022-08-18 ENCOUNTER — Inpatient Hospital Stay
Admission: EM | Admit: 2022-08-18 | Discharge: 2022-08-26 | DRG: 312 | Disposition: A | Discharge status: Home Health | Payer: 59 | Attending: Internal Medicine | Admitting: Internal Medicine

## 2022-08-18 ENCOUNTER — Observation Stay: Payer: 59

## 2022-08-18 ENCOUNTER — Encounter: Payer: Self-pay | Admitting: *Deleted

## 2022-08-18 DIAGNOSIS — Z7902 Long term (current) use of antithrombotics/antiplatelets: Secondary | ICD-10-CM

## 2022-08-18 DIAGNOSIS — E7800 Pure hypercholesterolemia, unspecified: Secondary | ICD-10-CM | POA: Diagnosis present

## 2022-08-18 DIAGNOSIS — I129 Hypertensive chronic kidney disease with stage 1 through stage 4 chronic kidney disease, or unspecified chronic kidney disease: Secondary | ICD-10-CM | POA: Diagnosis present

## 2022-08-18 DIAGNOSIS — I951 Orthostatic hypotension: Secondary | ICD-10-CM | POA: Diagnosis not present

## 2022-08-18 DIAGNOSIS — E1022 Type 1 diabetes mellitus with diabetic chronic kidney disease: Secondary | ICD-10-CM | POA: Diagnosis present

## 2022-08-18 DIAGNOSIS — I10 Essential (primary) hypertension: Secondary | ICD-10-CM | POA: Diagnosis present

## 2022-08-18 DIAGNOSIS — R42 Dizziness and giddiness: Secondary | ICD-10-CM

## 2022-08-18 DIAGNOSIS — E1065 Type 1 diabetes mellitus with hyperglycemia: Secondary | ICD-10-CM | POA: Diagnosis present

## 2022-08-18 DIAGNOSIS — N179 Acute kidney failure, unspecified: Secondary | ICD-10-CM | POA: Diagnosis present

## 2022-08-18 DIAGNOSIS — E10649 Type 1 diabetes mellitus with hypoglycemia without coma: Secondary | ICD-10-CM | POA: Diagnosis not present

## 2022-08-18 DIAGNOSIS — D849 Immunodeficiency, unspecified: Secondary | ICD-10-CM | POA: Diagnosis present

## 2022-08-18 DIAGNOSIS — C9000 Multiple myeloma not having achieved remission: Secondary | ICD-10-CM | POA: Diagnosis present

## 2022-08-18 DIAGNOSIS — E1069 Type 1 diabetes mellitus with other specified complication: Secondary | ICD-10-CM | POA: Insufficient documentation

## 2022-08-18 DIAGNOSIS — E162 Hypoglycemia, unspecified: Secondary | ICD-10-CM | POA: Diagnosis present

## 2022-08-18 DIAGNOSIS — F1210 Cannabis abuse, uncomplicated: Secondary | ICD-10-CM | POA: Diagnosis present

## 2022-08-18 DIAGNOSIS — Z89422 Acquired absence of other left toe(s): Secondary | ICD-10-CM

## 2022-08-18 DIAGNOSIS — E1040 Type 1 diabetes mellitus with diabetic neuropathy, unspecified: Secondary | ICD-10-CM

## 2022-08-18 DIAGNOSIS — N1831 Chronic kidney disease, stage 3a: Secondary | ICD-10-CM | POA: Diagnosis present

## 2022-08-18 DIAGNOSIS — E871 Hypo-osmolality and hyponatremia: Secondary | ICD-10-CM | POA: Diagnosis present

## 2022-08-18 DIAGNOSIS — R5382 Chronic fatigue, unspecified: Secondary | ICD-10-CM | POA: Diagnosis present

## 2022-08-18 DIAGNOSIS — I959 Hypotension, unspecified: Secondary | ICD-10-CM | POA: Diagnosis present

## 2022-08-18 DIAGNOSIS — R55 Syncope and collapse: Principal | ICD-10-CM | POA: Diagnosis present

## 2022-08-18 DIAGNOSIS — Z8249 Family history of ischemic heart disease and other diseases of the circulatory system: Secondary | ICD-10-CM

## 2022-08-18 DIAGNOSIS — Z880 Allergy status to penicillin: Secondary | ICD-10-CM

## 2022-08-18 DIAGNOSIS — E860 Dehydration: Secondary | ICD-10-CM | POA: Diagnosis present

## 2022-08-18 DIAGNOSIS — F1410 Cocaine abuse, uncomplicated: Secondary | ICD-10-CM | POA: Diagnosis present

## 2022-08-18 DIAGNOSIS — I440 Atrioventricular block, first degree: Secondary | ICD-10-CM | POA: Diagnosis present

## 2022-08-18 DIAGNOSIS — R531 Weakness: Secondary | ICD-10-CM

## 2022-08-18 DIAGNOSIS — Z7982 Long term (current) use of aspirin: Secondary | ICD-10-CM

## 2022-08-18 DIAGNOSIS — R9431 Abnormal electrocardiogram [ECG] [EKG]: Secondary | ICD-10-CM

## 2022-08-18 DIAGNOSIS — Z794 Long term (current) use of insulin: Secondary | ICD-10-CM

## 2022-08-18 DIAGNOSIS — Z79899 Other long term (current) drug therapy: Secondary | ICD-10-CM

## 2022-08-18 LAB — COMPREHENSIVE METABOLIC PANEL
ALT: 23 U/L (ref 0–44)
AST: 23 U/L (ref 15–41)
Albumin: 3.7 g/dL (ref 3.5–5.0)
Alkaline Phosphatase: 54 U/L (ref 38–126)
Anion gap: 12 (ref 5–15)
BUN: 43 mg/dL — ABNORMAL HIGH (ref 8–23)
CO2: 22 mmol/L (ref 22–32)
Calcium: 9 mg/dL (ref 8.9–10.3)
Chloride: 99 mmol/L (ref 98–111)
Creatinine, Ser: 2.04 mg/dL — ABNORMAL HIGH (ref 0.61–1.24)
GFR, Estimated: 35 mL/min — ABNORMAL LOW (ref >60)
Glucose, Bld: 150 mg/dL — ABNORMAL HIGH (ref 70–99)
Potassium: 3.6 mmol/L (ref 3.5–5.1)
Sodium: 133 mmol/L — ABNORMAL LOW (ref 135–145)
Total Bilirubin: 0.8 mg/dL (ref 0.3–1.2)
Total Protein: 9 g/dL — ABNORMAL HIGH (ref 6.5–8.1)

## 2022-08-18 LAB — CBC WITH DIFFERENTIAL/PLATELET
Abs Immature Granulocytes: 0.01 10*3/uL (ref 0.00–0.07)
Basophils Absolute: 0 10*3/uL (ref 0.0–0.1)
Basophils Relative: 1 %
Eosinophils Absolute: 0 10*3/uL (ref 0.0–0.5)
Eosinophils Relative: 1 %
HCT: 26.7 % — ABNORMAL LOW (ref 39.0–52.0)
Hemoglobin: 9 g/dL — ABNORMAL LOW (ref 13.0–17.0)
Immature Granulocytes: 0 %
Lymphocytes Relative: 44 %
Lymphs Abs: 1.7 10*3/uL (ref 0.7–4.0)
MCH: 31.5 pg (ref 26.0–34.0)
MCHC: 33.7 g/dL (ref 30.0–36.0)
MCV: 93.4 fL (ref 80.0–100.0)
Monocytes Absolute: 0.4 10*3/uL (ref 0.1–1.0)
Monocytes Relative: 10 %
Neutro Abs: 1.7 10*3/uL (ref 1.7–7.7)
Neutrophils Relative %: 44 %
Platelets: 241 10*3/uL (ref 150–400)
RBC: 2.86 MIL/uL — ABNORMAL LOW (ref 4.22–5.81)
RDW: 15 % (ref 11.5–15.5)
WBC: 3.8 10*3/uL — ABNORMAL LOW (ref 4.0–10.5)
nRBC: 0 % (ref 0.0–0.2)

## 2022-08-18 LAB — MAGNESIUM: Magnesium: 1.9 mg/dL (ref 1.7–2.4)

## 2022-08-18 LAB — CBG MONITORING, ED: Glucose-Capillary: 170 mg/dL — ABNORMAL HIGH (ref 70–99)

## 2022-08-18 LAB — TROPONIN I (HIGH SENSITIVITY): Troponin I (High Sensitivity): 16 ng/L (ref <18)

## 2022-08-18 MED ORDER — HYDRALAZINE HCL 10 MG PO TABS
10.0000 mg | ORAL_TABLET | Freq: Three times a day (TID) | ORAL | Status: DC
  Administered 2022-08-19 – 2022-08-25 (×15): 10 mg via ORAL
  Filled 2022-08-18 (×17): qty 1

## 2022-08-18 MED ORDER — PANTOPRAZOLE SODIUM 40 MG PO TBEC
40.0000 mg | DELAYED_RELEASE_TABLET | Freq: Two times a day (BID) | ORAL | Status: DC
  Administered 2022-08-19 – 2022-08-26 (×15): 40 mg via ORAL
  Filled 2022-08-18 (×16): qty 1

## 2022-08-18 MED ORDER — ASPIRIN 81 MG PO TBEC
81.0000 mg | DELAYED_RELEASE_TABLET | Freq: Every morning | ORAL | Status: DC
  Administered 2022-08-19 – 2022-08-26 (×8): 81 mg via ORAL
  Filled 2022-08-18 (×8): qty 1

## 2022-08-18 MED ORDER — INSULIN ASPART 100 UNIT/ML IJ SOLN
0.0000 [IU] | Freq: Every day | INTRAMUSCULAR | Status: DC
  Administered 2022-08-24 – 2022-08-25 (×2): 2 [IU] via SUBCUTANEOUS
  Filled 2022-08-18 (×2): qty 1

## 2022-08-18 MED ORDER — MAGNESIUM SULFATE 2 GM/50ML IV SOLN
2.0000 g | Freq: Once | INTRAVENOUS | Status: AC
  Administered 2022-08-19: 2 g via INTRAVENOUS
  Filled 2022-08-18: qty 50

## 2022-08-18 MED ORDER — ATORVASTATIN CALCIUM 20 MG PO TABS
80.0000 mg | ORAL_TABLET | Freq: Every day | ORAL | Status: DC
  Administered 2022-08-19 – 2022-08-25 (×7): 80 mg via ORAL
  Filled 2022-08-18 (×8): qty 4

## 2022-08-18 MED ORDER — ENOXAPARIN SODIUM 40 MG/0.4ML IJ SOSY
40.0000 mg | PREFILLED_SYRINGE | INTRAMUSCULAR | Status: DC
  Administered 2022-08-19 – 2022-08-23 (×5): 40 mg via SUBCUTANEOUS
  Filled 2022-08-18 (×5): qty 0.4

## 2022-08-18 MED ORDER — ACETAMINOPHEN 325 MG PO TABS
650.0000 mg | ORAL_TABLET | Freq: Four times a day (QID) | ORAL | Status: DC | PRN
  Administered 2022-08-19 – 2022-08-26 (×15): 650 mg via ORAL
  Filled 2022-08-18 (×16): qty 2

## 2022-08-18 MED ORDER — AMLODIPINE BESYLATE 10 MG PO TABS
10.0000 mg | ORAL_TABLET | Freq: Every day | ORAL | Status: DC
  Administered 2022-08-19: 10 mg via ORAL
  Filled 2022-08-18: qty 2

## 2022-08-18 MED ORDER — ACYCLOVIR 200 MG PO CAPS
400.0000 mg | ORAL_CAPSULE | Freq: Two times a day (BID) | ORAL | Status: DC
  Administered 2022-08-18 – 2022-08-26 (×16): 400 mg via ORAL
  Filled 2022-08-18 (×17): qty 2

## 2022-08-18 MED ORDER — LACTATED RINGERS IV SOLN: INTRAVENOUS | Status: DC

## 2022-08-18 MED ORDER — ACETAMINOPHEN 650 MG RE SUPP: 650.0000 mg | Freq: Four times a day (QID) | RECTAL | Status: DC | PRN

## 2022-08-18 MED ORDER — SODIUM CHLORIDE 0.9 % IV BOLUS
1000.0000 mL | Freq: Once | INTRAVENOUS | Status: AC
  Administered 2022-08-18: 1000 mL via INTRAVENOUS

## 2022-08-18 MED ORDER — SODIUM CHLORIDE 0.9% FLUSH
3.0000 mL | Freq: Two times a day (BID) | INTRAVENOUS | Status: DC
  Administered 2022-08-19 – 2022-08-26 (×9): 3 mL via INTRAVENOUS

## 2022-08-18 MED ORDER — LISINOPRIL 5 MG PO TABS
2.5000 mg | ORAL_TABLET | Freq: Every day | ORAL | Status: DC
  Administered 2022-08-19 – 2022-08-26 (×6): 2.5 mg via ORAL
  Filled 2022-08-18 (×6): qty 1

## 2022-08-18 MED ORDER — LACTATED RINGERS IV BOLUS: 1000.0000 mL | Freq: Once | INTRAVENOUS | Status: DC

## 2022-08-18 MED ORDER — POTASSIUM CHLORIDE CRYS ER 20 MEQ PO TBCR
40.0000 meq | EXTENDED_RELEASE_TABLET | Freq: Once | ORAL | Status: AC
  Administered 2022-08-18: 40 meq via ORAL
  Filled 2022-08-18: qty 2

## 2022-08-18 MED ORDER — CLOPIDOGREL BISULFATE 75 MG PO TABS
75.0000 mg | ORAL_TABLET | Freq: Every morning | ORAL | Status: DC
  Administered 2022-08-19 – 2022-08-26 (×8): 75 mg via ORAL
  Filled 2022-08-18 (×8): qty 1

## 2022-08-18 MED ORDER — INSULIN GLARGINE-YFGN 100 UNIT/ML ~~LOC~~ SOLN
13.0000 [IU] | Freq: Every day | SUBCUTANEOUS | Status: DC
  Administered 2022-08-19 – 2022-08-22 (×3): 13 [IU] via SUBCUTANEOUS
  Filled 2022-08-18 (×5): qty 0.13

## 2022-08-18 MED ORDER — DOCUSATE SODIUM 100 MG PO CAPS: 100.0000 mg | ORAL_CAPSULE | Freq: Every day | ORAL | Status: DC | PRN

## 2022-08-18 MED ORDER — INSULIN ASPART 100 UNIT/ML IJ SOLN
0.0000 [IU] | Freq: Three times a day (TID) | INTRAMUSCULAR | Status: DC
  Administered 2022-08-20: 2 [IU] via SUBCUTANEOUS
  Administered 2022-08-20: 3 [IU] via SUBCUTANEOUS
  Administered 2022-08-21 – 2022-08-24 (×7): 2 [IU] via SUBCUTANEOUS
  Administered 2022-08-25: 3 [IU] via SUBCUTANEOUS
  Administered 2022-08-25 – 2022-08-26 (×2): 1 [IU] via SUBCUTANEOUS
  Filled 2022-08-18 (×12): qty 1

## 2022-08-18 NOTE — ED Triage Notes (Signed)
BIB family from home for "has felt generally weak and chilled since waking at 1200, relates to DM". Reported possible syncope vs. Near syncope PTA. Last ate "a little this am". Denies NVD fever. Denies syncope or fall. Alert, NAD, calm, interactive.

## 2022-08-18 NOTE — Assessment & Plan Note (Signed)
On top of pre-existing CKD.  Will check urinalysis, sodium and creatinine.  Will give a lactated Ringer bolus now and infusion.  Trend creatinine.

## 2022-08-18 NOTE — ED Notes (Signed)
Mag delayed due to no pump available at this time

## 2022-08-18 NOTE — Assessment & Plan Note (Addendum)
At about 3 PM witnessed by [employer]. Self resolved. Will keep on telemtry, check echo and monitor glucose and d-dimer.  EKG is reviewed and concern is raised if the patient is having a prolonged QTc.  However I think the automated EKG is reading.  The P wave as part of the QT interval.  On my review the QT intervals is only 400 ms and gets corrected to between 421 to 432 ms.  However the PR interval is undeniably prolonged.  Therefore I will check a thyroid cascade and Lyme disease serologies.  Other consideration includes toxin related presyncope/collapse.  Therefore we will check urine toxicology screen  Of note patient has a multiple myeloma that has not achieved remission.  Patient is therefore immunocompromised.  Therefore I will check blood cultures for the patient and check CXR. low threshold for starting iv abx..  At this time patient's blood count appears to be comparable to last value in the system. And patient did not appear toxic on my exam, nor has clinical focus of infection.

## 2022-08-18 NOTE — H&P (Signed)
History and Physical    Patient: Nicholas Weiss GNF:[id number] DOB: 1950/06/06 DOA: 08/18/2022 DOS: the patient was seen and examined on 08/18/2022 PCP: Nicholas Ivan, MD  Patient coming from: Home  Chief Complaint:  Chief Complaint  Patient presents with   Weakness   Near Syncope   HPI: Nicholas Weiss is Weiss 72 y.o. male with medical history significant of Beatties mellitus on insulin at home reportedly has chronic fatigue.  As per history obtained from patient's partner by the name of Nicholas Weiss over the phone, patient often gets to the dining table and then puts his head down on the dining table and rests, he often lays down on the left seat in the living space, for prolonged periods of time.  In spite of this patient does able to get to the mailbox outside which is about 100 feet away and back.  Also this causes him shortness of breath.  Patient was in his usual state of health earlier today as well.  There has been no fever nausea vomiting diarrhea or any new aches or pains.  Patient continued to have marked fatigue, laying around the house.   At approximately 3 PM, patient had gone to [employer] local [employer] along with his partner Nicholas Weiss to get some supplies.  As the patient got out of the car he was noted to "almost pass out ".  Family member grabbed the patient and prevented the patient from falling down on the ground.  Subsequently patient regained comfort composure and was brought to [facility] ER.  Family member reports that the patient was also diaphoretic at the time.  The entirety of the above history was obtained from patient's partner by the name of Nicholas Weiss.  Over the phone.  Patient denied every complaint to me.  Including he could not tell me why the patient was brought to the hospital at all.  He denied any loss of consciousness any chest pains any trouble breathing.  In spite of patient's denial patient was noted to be fully oriented to location, year, month, date.  And self  Patient  offers no complaints at this time Review of Systems: As mentioned in the history of present illness. All other systems reviewed and are negative. Past Medical History:  Diagnosis Date   Acute metabolic encephalopathy 12/02/2020   DKA (diabetic ketoacidoses) 04/06/2016   Hypercholesteremia    Hypertension    Past Surgical History:  Procedure Laterality Date   AMPUTATION TOE Left 04/25/2022   Procedure: LEFT GREAT TOE AMPUTATION AND LEFT PARTIAL 2ND TOE AMPUTATION;  Surgeon: Nicholas Weiss, DPM;  Location: [facility] ORS;  Service: Podiatry;  Laterality: Left;   COLONOSCOPY WITH PROPOFOL N/Weiss 02/01/2020   Procedure: COLONOSCOPY WITH PROPOFOL;  Surgeon: Nicholas Reil, MD;  Location: [facility] ENDOSCOPY;  Service: Gastroenterology;  Laterality: N/Weiss;   ESOPHAGOGASTRODUODENOSCOPY  02/01/2020   Procedure: ESOPHAGOGASTRODUODENOSCOPY (EGD);  Surgeon: Nicholas Reil, MD;  Location: [facility] ENDOSCOPY;  Service: Gastroenterology;;   ESOPHAGOGASTRODUODENOSCOPY (EGD) WITH PROPOFOL N/Weiss 06/07/2022   Procedure: ESOPHAGOGASTRODUODENOSCOPY (EGD) WITH PROPOFOL;  Surgeon: Nicholas Salen, MD;  Location: [facility] ENDOSCOPY;  Service: Gastroenterology;  Laterality: N/Weiss;   KNEE SURGERY Right    Torn meniscus   KNEE SURGERY Left    Social History:  reports that he has never smoked. He has never used smokeless tobacco. He reports that he does not currently use alcohol. He reports current drug use. Drugs: Marijuana and "Crack" cocaine.  Allergies  Allergen Reactions   Penicillins Other (See Comments)  Childhood allergy -tolerated amoxil 03/2022 Unknown reaction Has patient had Weiss PCN reaction causing immediate rash, facial/tongue/throat swelling, SOB or lightheadedness with hypotension: No Has patient had Weiss PCN reaction causing severe rash involving mucus membranes or skin necrosis: No Has patient had Weiss PCN reaction that required hospitalization No Has patient had Weiss PCN reaction occurring within the last 10 years: No If all of  the above answers are "NO", then may proceed with Cephalosporin use.     Family History  Problem Relation Age of Onset   Heart attack Father    Hypertension Sister    Cancer Sister     Prior to Admission medications   Medication Sig Start Date End Date Taking? Authorizing Provider  acetaminophen (TYLENOL) 500 MG tablet Take 1,000 mg by mouth daily as needed for mild pain or moderate pain.   Yes [provider]  acyclovir (ZOVIRAX) 400 MG tablet Take 1 tablet (400 mg total) by mouth 2 (two) times daily. 08/06/22  Yes Nicholas Hines, MD  amLODipine (NORVASC) 10 MG tablet Take 1 tablet (10 mg total) by mouth daily. 07/17/22  Yes Nicholas Nielsen, DO  ascorbic acid (VITAMIN C) 500 MG tablet Take 1 tablet (500 mg total) by mouth daily. 04/09/22  Yes Nicholas Courser, MD  aspirin EC 81 MG tablet Take 81 mg by mouth in the morning.   Yes [provider]  atorvastatin (LIPITOR) 80 MG tablet Take 1 tablet (80 mg total) by mouth at bedtime. Hold while taking Paxlovid 04/08/22  Yes Nicholas Courser, MD  Baclofen 5 MG TABS Take 5 mg by mouth daily as needed (muscle spasm).   Yes [provider]  clopidogrel (PLAVIX) 75 MG tablet Take 75 mg by mouth in the morning. 12/03/21  Yes [provider]  Continuous Glucose Sensor (DEXCOM G7 SENSOR) MISC Use 1 each every 10 (ten) days 07/27/22  Yes [provider]  docusate sodium (COLACE) 100 MG capsule Take 100 mg by mouth daily as needed for mild constipation.   Yes [provider]  feeding supplement, GLUCERNA SHAKE, (GLUCERNA SHAKE) LIQD Take 237 mLs by mouth 3 (three) times daily between meals. 04/29/22  Yes Nicholas Grandchild A, DO  Glucagon (BAQSIMI ONE PACK) 3 MG/DOSE POWD Place 3 mg into the nose once as needed for up to 1 dose (for low sugar). 07/22/22  Yes Nicholas Se, MD  hydrALAZINE (APRESOLINE) 10 MG tablet Take 1 tablet (10 mg total) by mouth every 8 (eight) hours. 07/16/22  Yes Nicholas Nielsen, DO  insulin  degludec (TRESIBA FLEXTOUCH) 100 UNIT/ML FlexTouch Pen Inject 13 Units into the skin daily. 07/16/22  Yes Nicholas Nielsen, DO  Insulin Pen Needle (PEN NEEDLES 3/16") 31G X 5 MM MISC 1 Needle by Does not apply route 4 (four) times daily as needed. 07/22/22 08/21/22 Yes Nicholas Se, MD  lisinopril (ZESTRIL) 2.5 MG tablet Take 2.5 mg by mouth daily. 06/25/22  Yes [provider]  Multiple Vitamin (MULTIVITAMIN WITH MINERALS) TABS tablet Take 1 tablet by mouth daily. 04/09/22  Yes Nicholas Courser, MD  NOVOLOG 100 UNIT/ML injection Sliding Scale: Glucose 70 - 120: 0 units  Glucose 121 - 150: 1 unit  Glucose 151 - 200: 2 units Glucose 201 - 250: 3 units  Glucose 251 - 300: 5 units  Glucose 301 - 350: 7 units  Glucose 351 - 400: 9 units  Glucose > 400: call your docotr 06/12/22  Yes Jerald Kief, MD  ondansetron (ZOFRAN) 8 MG tablet  Take 8 mg by mouth daily as needed for nausea or vomiting.   Yes [provider]  pantoprazole (PROTONIX) 40 MG tablet Take 1 tablet (40 mg total) by mouth 2 (two) times daily. Take twice daily for 2 months then decrease to once daily 06/12/22  Yes Russella Dar, NP    Physical Exam: Vitals:   08/18/22 2030 08/18/22 2100 08/18/22 2130 08/18/22 2200  BP: (!) 140/80 (!) 143/80 137/81 (!) 145/90  Pulse: 70 71 73 76  Resp: 11 12 17 11   Temp:      TempSrc:      SpO2: 100% 100% 100% 100%  Weight:       General: Well-built gentleman that did not appear to be in any distress neurologic exam: Oriented to location, self, year, month, date.  No focal motor deficit Respiratory exam: Bilateral air entry vesicular Cardiovascular exam S1 is normal Abdomen soft nontender Extremities warm without edema Data Reviewed:  Labs on Admission:  Results for orders placed or performed during the hospital encounter of 08/18/22 (from the past 24 hour(s))  CBG monitoring, ED     Status: Abnormal   Collection Time: 08/18/22  4:12 PM  Result Value Ref Range    Glucose-Capillary 170 (H) 70 - 99 mg/dL  CBC with Differential     Status: Abnormal   Collection Time: 08/18/22  4:22 PM  Result Value Ref Range   WBC 3.8 (L) 4.0 - 10.5 K/uL   RBC 2.86 (L) 4.22 - 5.81 MIL/uL   Hemoglobin 9.0 (L) 13.0 - 17.0 g/dL   HCT 75.6 (L) 43.3 - 29.5 %   MCV 93.4 80.0 - 100.0 fL   MCH 31.5 26.0 - 34.0 pg   MCHC 33.7 30.0 - 36.0 g/dL   RDW 18.8 41.6 - 60.6 %   Platelets 241 150 - 400 K/uL   nRBC 0.0 0.0 - 0.2 %   Neutrophils Relative % 44 %   Neutro Abs 1.7 1.7 - 7.7 K/uL   Lymphocytes Relative 44 %   Lymphs Abs 1.7 0.7 - 4.0 K/uL   Monocytes Relative 10 %   Monocytes Absolute 0.4 0.1 - 1.0 K/uL   Eosinophils Relative 1 %   Eosinophils Absolute 0.0 0.0 - 0.5 K/uL   Basophils Relative 1 %   Basophils Absolute 0.0 0.0 - 0.1 K/uL   Immature Granulocytes 0 %   Abs Immature Granulocytes 0.01 0.00 - 0.07 K/uL  Comprehensive metabolic panel     Status: Abnormal   Collection Time: 08/18/22  4:22 PM  Result Value Ref Range   Sodium 133 (L) 135 - 145 mmol/L   Potassium 3.6 3.5 - 5.1 mmol/L   Chloride 99 98 - 111 mmol/L   CO2 22 22 - 32 mmol/L   Glucose, Bld 150 (H) 70 - 99 mg/dL   BUN 43 (H) 8 - 23 mg/dL   Creatinine, Ser 3.01 (H) 0.61 - 1.24 mg/dL   Calcium 9.0 8.9 - 60.1 mg/dL   Total Protein 9.0 (H) 6.5 - 8.1 g/dL   Albumin 3.7 3.5 - 5.0 g/dL   AST 23 15 - 41 U/L   ALT 23 0 - 44 U/L   Alkaline Phosphatase 54 38 - 126 U/L   Total Bilirubin 0.8 0.3 - 1.2 mg/dL   GFR, Estimated 35 (L) >60 mL/min   Anion gap 12 5 - 15  Troponin I (High Sensitivity)     Status: None   Collection Time: 08/18/22  4:22 PM  Result Value  Ref Range   Troponin I (High Sensitivity) 16 <18 ng/L  Magnesium     Status: None   Collection Time: 08/18/22  4:22 PM  Result Value Ref Range   Magnesium 1.9 1.7 - 2.4 mg/dL   Basic Metabolic Panel: Recent Labs  Lab 08/18/22 1622  NA 133*  K 3.6  CL 99  CO2 22  GLUCOSE 150*  BUN 43*  CREATININE 2.04*  CALCIUM 9.0  MG 1.9    Liver Function Tests: Recent Labs  Lab 08/18/22 1622  AST 23  ALT 23  ALKPHOS 54  BILITOT 0.8  PROT 9.0*  ALBUMIN 3.7   No results for input(s): "LIPASE", "AMYLASE" in the last 168 hours. No results for input(s): "AMMONIA" in the last 168 hours. CBC: Recent Labs  Lab 08/18/22 1622  WBC 3.8*  NEUTROABS 1.7  HGB 9.0*  HCT 26.7*  MCV 93.4  PLT 241   Cardiac Enzymes: Recent Labs  Lab 08/18/22 1622  TROPONINIHS 16    BNP (last 3 results) No results for input(s): "PROBNP" in the last 8760 hours. CBG: Recent Labs  Lab 08/18/22 1612  GLUCAP 170*    Radiological Exams on Admission:  No results found.  EKG: Independently reviewed. Yes    Assessment and Plan: Postural dizziness with presyncope At about 3 PM witnessed by [employer]. Self resolved. Will keep on telemtry, check echo and monitor glucose and d-dimer.  EKG is reviewed and concern is raised if the patient is having Weiss prolonged QTc.  However I think the automated EKG is reading.  The P wave as part of the QT interval.  On my review the QT intervals is only 400 ms and gets corrected to between 421 to 432 ms.  However the PR interval is undeniably prolonged.  Therefore I will check Weiss thyroid cascade and Lyme disease serologies.  Other consideration includes toxin related presyncope/collapse.  Therefore we will check urine toxicology screen  Of note patient has Weiss multiple myeloma that has not achieved remission.  Patient is therefore immunocompromised.  Therefore I will check blood cultures for the patient and check CXR. low threshold for starting iv abx..  At this time patient's blood count appears to be comparable to last value in the system.  AKI (acute kidney injury) (HCC) On top of pre-existing CKD.  Will check urinalysis, sodium and creatinine.  Will give Weiss lactated Ringer bolus now and infusion.  Trend creatinine.      Advance Care Planning:   Code Status: Full Code   Consults:   Family  Communication: discussed with Nicholas Weiss over phone withpatient perimssion.  Severity of Illness: The appropriate patient status for this patient is OBSERVATION. Observation status is judged to be reasonable and necessary in order to provide the required intensity of service to ensure the patient's safety. The patient's presenting symptoms, physical exam findings, and initial radiographic and laboratory data in the context of their medical condition is felt to place them at decreased risk for further clinical deterioration. Furthermore, it is anticipated that the patient will be medically stable for discharge from the hospital within 2 midnights of admission.   Author: Nolberto Hanlon, MD 08/18/2022 11:00 PM  For on call review [email or web address].

## 2022-08-18 NOTE — ED Provider Notes (Signed)
[facility] Provider Note    Event Date/Time   First MD Initiated Contact with Patient 08/18/22 1633     (approximate)   History   Weakness and Near Syncope   HPI  Nicholas Weiss is a 72 y.o. male with history of T1DM presenting to the emerged department for evaluation of weakness.  Per triage note, patient was brought in by his family because he has been feeling weak.  There was concern about a possible syncopal or near syncopal episode prior to arrival, patient does not recall the details of this.  Patient tells me he has been feeling generally weak, not had much of an appetite recently.    Physical Exam   Triage Vital Signs: ED Triage Vitals  Enc Vitals Group     BP 08/18/22 1616 94/74     Pulse Rate 08/18/22 1616 80     Resp 08/18/22 1616 16     Temp 08/18/22 1616 97.8 F (36.6 C)     Temp Source 08/18/22 1616 Oral     SpO2 08/18/22 1616 96 %     Weight 08/18/22 1617 155 lb (70.3 kg)     Height --      Head Circumference --      Peak Flow --      Pain Score 08/18/22 1617 0     Pain Loc --      Pain Edu? --      Excl. in GC? --     Most recent vital signs: Vitals:   08/18/22 2130 08/18/22 2200  BP: 137/81 (!) 145/90  Pulse: 73 76  Resp: 17 11  Temp:    SpO2: 100% 100%     General: Awake, interactive  CV:  Regular rate, good peripheral perfusion.  Resp:  Lungs clear, unlabored respirations.  Abd:  Soft, nondistended.  Neuro:  Symmetric facial movement, fluid speech   ED Results / Procedures / Treatments   Labs (all labs ordered are listed, but only abnormal results are displayed) Labs Reviewed  CBC WITH DIFFERENTIAL/PLATELET - Abnormal; Notable for the following components:      Result Value   WBC 3.8 (*)    RBC 2.86 (*)    Hemoglobin 9.0 (*)    HCT 26.7 (*)    All other components within normal limits  COMPREHENSIVE METABOLIC PANEL - Abnormal; Notable for the following components:   Sodium 133 (*)    Glucose, Bld  150 (*)    BUN 43 (*)    Creatinine, Ser 2.04 (*)    Total Protein 9.0 (*)    GFR, Estimated 35 (*)    All other components within normal limits  CBG MONITORING, ED - Abnormal; Notable for the following components:   Glucose-Capillary 170 (*)    All other components within normal limits  CULTURE, BLOOD (ROUTINE X 2)  CULTURE, BLOOD (ROUTINE X 2)  MAGNESIUM  URINALYSIS, ROUTINE W REFLEX MICROSCOPIC  D-DIMER, QUANTITATIVE  THYROID PANEL WITH TSH  LYME DISEASE SEROLOGY W/REFLEX  URINE DRUG SCREEN, QUALITATIVE ([facility] ONLY)  URINALYSIS, COMPLETE (UACMP) WITH MICROSCOPIC  CREATININE, URINE, RANDOM  SODIUM, URINE, RANDOM  HIV ANTIBODY (ROUTINE TESTING W REFLEX)  CBC  CREATININE, SERUM  PROTIME-INR  APTT  BASIC METABOLIC PANEL  CBC  CBG MONITORING, ED  TROPONIN I (HIGH SENSITIVITY)     EKG EKG independently reviewed interpreted by myself (ER attending) demonstrates:  EKG at 1616 demonstrates sinus rhythm at a rate of 77, PR 386, QRS  88, QTc 436. EKG at 1647 demonstrates sinus rhythm with prolonged PR at 312, QRS 96, QTc 609 EKG at 2144 demonstrates suspect sinus rhythm, interpreted by computer as ectopic atrial rhythm at a rate of 78, PR 164, QTc 575  Of note, patient with multiple documented tachycardic heart rates, these appear to be due to the computer double counting his heart rate given significantly abnormal intervals.   RADIOLOGY Imaging independently reviewed and interpreted by myself demonstrates:    PROCEDURES:  Critical Care performed: Yes, see critical care procedure note(s)  CRITICAL CARE Performed by: Trinna Post   Total critical care time: 30 minutes  Critical care time was exclusive of separately billable procedures and treating other patients.  Critical care was necessary to treat or prevent imminent or life-threatening deterioration.  Critical care was time spent personally by me on the following activities: development of treatment plan with patient  and/or surrogate as well as nursing, discussions with consultants, evaluation of patient's response to treatment, examination of patient, obtaining history from patient or surrogate, ordering and performing treatments and interventions, ordering and review of laboratory studies, ordering and review of radiographic studies, pulse oximetry and re-evaluation of patient's condition.   Marland Kitchen1-3 Lead EKG Interpretation  Performed by: Trinna Post, MD Authorized by: Trinna Post, MD     Interpretation: abnormal     ECG rate assessment: normal     Rhythm: sinus rhythm     Ectopy: none     Conduction: normal      MEDICATIONS ORDERED IN ED: Medications  magnesium sulfate IVPB 2 g 50 mL (has no administration in time range)  insulin aspart (novoLOG) injection 0-6 Units (has no administration in time range)  insulin aspart (novoLOG) injection 0-5 Units (has no administration in time range)  aspirin EC tablet 81 mg (has no administration in time range)  insulin glargine-yfgn (SEMGLEE) injection 13 Units (has no administration in time range)  atorvastatin (LIPITOR) tablet 80 mg (has no administration in time range)  hydrALAZINE (APRESOLINE) tablet 10 mg (has no administration in time range)  lisinopril (ZESTRIL) tablet 2.5 mg (has no administration in time range)  acyclovir (ZOVIRAX) 200 MG capsule 400 mg (has no administration in time range)  amLODipine (NORVASC) tablet 10 mg (has no administration in time range)  docusate sodium (COLACE) capsule 100 mg (has no administration in time range)  clopidogrel (PLAVIX) tablet 75 mg (has no administration in time range)  pantoprazole (PROTONIX) EC tablet 40 mg (has no administration in time range)  enoxaparin (LOVENOX) injection 40 mg (has no administration in time range)  sodium chloride flush (NS) 0.9 % injection 3 mL (has no administration in time range)  acetaminophen (TYLENOL) tablet 650 mg (has no administration in time range)    Or  acetaminophen (TYLENOL)  suppository 650 mg (has no administration in time range)  lactated ringers bolus 1,000 mL (has no administration in time range)  lactated ringers infusion (has no administration in time range)  sodium chloride 0.9 % bolus 1,000 mL (1,000 mLs Intravenous New Bag/Given 08/18/22 2048)  potassium chloride SA (KLOR-CON M) CR tablet 40 mEq (40 mEq Oral Given 08/18/22 2241)     IMPRESSION / MDM / ASSESSMENT AND PLAN / ED COURSE  I reviewed the triage vital signs and the nursing notes.  Differential diagnosis includes, but is not limited to, anemia, electrolyte abnormality, DKA, hypoglycemia  Patient's presentation is most consistent with acute presentation with potential threat to life or bodily function.  72 year old male presenting to  the emergency department for evaluation of weakness with near syncopal or syncopal episode.  Here, lab work without severe derangements compared to prior, but EKG demonstrates significantly prolonged PR and QTc interval.  Will optimize potassium and magnesium, but given the degree of his QTc prolongation, do think observation is reasonable.  I did review his medication list, on pantoprazole, but otherwise I do not see any classic medications known for QTc prolongation.  Will reach out to hospitalist team to discuss further.      FINAL CLINICAL IMPRESSION(S) / ED DIAGNOSES   Final diagnoses:  Near syncope  Prolonged Q-T interval on ECG  Prolonged P-R interval  Generalized weakness     Rx / DC Orders   ED Discharge Orders     None        Note:  This document was prepared using Dragon voice recognition software and may include unintentional dictation errors.   Trinna Post, MD 08/18/22 [phone]

## 2022-08-19 ENCOUNTER — Observation Stay (HOSPITAL_COMMUNITY)
Admit: 2022-08-19 | Discharge: 2022-08-19 | Disposition: A | Discharge status: Home / Self Care | Payer: 59 | Attending: Internal Medicine | Admitting: Internal Medicine

## 2022-08-19 DIAGNOSIS — Z8249 Family history of ischemic heart disease and other diseases of the circulatory system: Secondary | ICD-10-CM | POA: Diagnosis not present

## 2022-08-19 DIAGNOSIS — I951 Orthostatic hypotension: Secondary | ICD-10-CM | POA: Diagnosis present

## 2022-08-19 DIAGNOSIS — I129 Hypertensive chronic kidney disease with stage 1 through stage 4 chronic kidney disease, or unspecified chronic kidney disease: Secondary | ICD-10-CM | POA: Diagnosis present

## 2022-08-19 DIAGNOSIS — Z7902 Long term (current) use of antithrombotics/antiplatelets: Secondary | ICD-10-CM | POA: Diagnosis not present

## 2022-08-19 DIAGNOSIS — E1065 Type 1 diabetes mellitus with hyperglycemia: Secondary | ICD-10-CM | POA: Diagnosis present

## 2022-08-19 DIAGNOSIS — R55 Syncope and collapse: Secondary | ICD-10-CM

## 2022-08-19 DIAGNOSIS — R5382 Chronic fatigue, unspecified: Secondary | ICD-10-CM | POA: Diagnosis present

## 2022-08-19 DIAGNOSIS — I440 Atrioventricular block, first degree: Secondary | ICD-10-CM | POA: Diagnosis present

## 2022-08-19 DIAGNOSIS — N179 Acute kidney failure, unspecified: Secondary | ICD-10-CM | POA: Diagnosis present

## 2022-08-19 DIAGNOSIS — N1831 Chronic kidney disease, stage 3a: Secondary | ICD-10-CM | POA: Diagnosis present

## 2022-08-19 DIAGNOSIS — F1410 Cocaine abuse, uncomplicated: Secondary | ICD-10-CM | POA: Diagnosis present

## 2022-08-19 DIAGNOSIS — E7800 Pure hypercholesterolemia, unspecified: Secondary | ICD-10-CM | POA: Diagnosis present

## 2022-08-19 DIAGNOSIS — C9000 Multiple myeloma not having achieved remission: Secondary | ICD-10-CM | POA: Diagnosis present

## 2022-08-19 DIAGNOSIS — Z794 Long term (current) use of insulin: Secondary | ICD-10-CM | POA: Diagnosis not present

## 2022-08-19 DIAGNOSIS — E860 Dehydration: Secondary | ICD-10-CM | POA: Diagnosis present

## 2022-08-19 DIAGNOSIS — E10649 Type 1 diabetes mellitus with hypoglycemia without coma: Secondary | ICD-10-CM | POA: Diagnosis not present

## 2022-08-19 DIAGNOSIS — R42 Dizziness and giddiness: Secondary | ICD-10-CM | POA: Diagnosis not present

## 2022-08-19 DIAGNOSIS — E871 Hypo-osmolality and hyponatremia: Secondary | ICD-10-CM | POA: Diagnosis present

## 2022-08-19 DIAGNOSIS — D849 Immunodeficiency, unspecified: Secondary | ICD-10-CM | POA: Diagnosis present

## 2022-08-19 DIAGNOSIS — Z7982 Long term (current) use of aspirin: Secondary | ICD-10-CM | POA: Diagnosis not present

## 2022-08-19 DIAGNOSIS — E162 Hypoglycemia, unspecified: Secondary | ICD-10-CM | POA: Diagnosis not present

## 2022-08-19 DIAGNOSIS — F1210 Cannabis abuse, uncomplicated: Secondary | ICD-10-CM | POA: Diagnosis present

## 2022-08-19 DIAGNOSIS — E1022 Type 1 diabetes mellitus with diabetic chronic kidney disease: Secondary | ICD-10-CM | POA: Diagnosis present

## 2022-08-19 DIAGNOSIS — Z880 Allergy status to penicillin: Secondary | ICD-10-CM | POA: Diagnosis not present

## 2022-08-19 DIAGNOSIS — Z89422 Acquired absence of other left toe(s): Secondary | ICD-10-CM | POA: Diagnosis not present

## 2022-08-19 DIAGNOSIS — Z79899 Other long term (current) drug therapy: Secondary | ICD-10-CM | POA: Diagnosis not present

## 2022-08-19 LAB — URINE DRUG SCREEN, QUALITATIVE (ARMC ONLY)
Amphetamines, Ur Screen: NOT DETECTED
Barbiturates, Ur Screen: NOT DETECTED
Benzodiazepine, Ur Scrn: NOT DETECTED
Cannabinoid 50 Ng, Ur ~~LOC~~: POSITIVE — AB
Cocaine Metabolite,Ur ~~LOC~~: POSITIVE — AB
MDMA (Ecstasy)Ur Screen: NOT DETECTED
Methadone Scn, Ur: NOT DETECTED
Opiate, Ur Screen: NOT DETECTED
Phencyclidine (PCP) Ur S: NOT DETECTED
Tricyclic, Ur Screen: NOT DETECTED

## 2022-08-19 LAB — ECHOCARDIOGRAM COMPLETE
AR max vel: 3.09 cm2
AV Area VTI: 2.89 cm2
AV Area mean vel: 2.83 cm2
AV Mean grad: 2 mmHg
AV Peak grad: 3 mmHg
Ao pk vel: 0.87 m/s
Area-P 1/2: 5.79 cm2
MV VTI: 2.48 cm2
S' Lateral: 2.7 cm
Weight: 2480 oz

## 2022-08-19 LAB — URINALYSIS, COMPLETE (UACMP) WITH MICROSCOPIC
Bacteria, UA: NONE SEEN
Bilirubin Urine: NEGATIVE
Glucose, UA: >=500 mg/dL — AB
Hgb urine dipstick: NEGATIVE
Ketones, ur: NEGATIVE mg/dL
Leukocytes,Ua: NEGATIVE
Nitrite: NEGATIVE
Protein, ur: 30 mg/dL — AB
Specific Gravity, Urine: 1.016 (ref 1.005–1.030)
Squamous Epithelial / HPF: NONE SEEN /HPF (ref 0–5)
pH: 5 (ref 5.0–8.0)

## 2022-08-19 LAB — CREATININE, URINE, RANDOM: Creatinine, Urine: 156 mg/dL

## 2022-08-19 LAB — PROTIME-INR
INR: 1.1 (ref 0.8–1.2)
Prothrombin Time: 14.1 seconds (ref 11.4–15.2)

## 2022-08-19 LAB — BASIC METABOLIC PANEL
Anion gap: 6 (ref 5–15)
BUN: 41 mg/dL — ABNORMAL HIGH (ref 8–23)
CO2: 26 mmol/L (ref 22–32)
Calcium: 8.7 mg/dL — ABNORMAL LOW (ref 8.9–10.3)
Chloride: 102 mmol/L (ref 98–111)
Creatinine, Ser: 1.71 mg/dL — ABNORMAL HIGH (ref 0.61–1.24)
GFR, Estimated: 43 mL/min — ABNORMAL LOW (ref >60)
Glucose, Bld: 107 mg/dL — ABNORMAL HIGH (ref 70–99)
Potassium: 5.2 mmol/L — ABNORMAL HIGH (ref 3.5–5.1)
Sodium: 134 mmol/L — ABNORMAL LOW (ref 135–145)

## 2022-08-19 LAB — GLUCOSE, CAPILLARY
Glucose-Capillary: 69 mg/dL — ABNORMAL LOW (ref 70–99)
Glucose-Capillary: 99 mg/dL (ref 70–99)
Glucose-Capillary: 65 mg/dL — ABNORMAL LOW (ref 70–99)
Glucose-Capillary: 201 mg/dL — ABNORMAL HIGH (ref 70–99)
Glucose-Capillary: 64 mg/dL — ABNORMAL LOW (ref 70–99)

## 2022-08-19 LAB — CBC
HCT: 27.7 % — ABNORMAL LOW (ref 39.0–52.0)
HCT: 27.9 % — ABNORMAL LOW (ref 39.0–52.0)
Hemoglobin: 9 g/dL — ABNORMAL LOW (ref 13.0–17.0)
Hemoglobin: 9.2 g/dL — ABNORMAL LOW (ref 13.0–17.0)
MCH: 30.4 pg (ref 26.0–34.0)
MCH: 30.7 pg (ref 26.0–34.0)
MCHC: 32.3 g/dL (ref 30.0–36.0)
MCHC: 33.2 g/dL (ref 30.0–36.0)
MCV: 92.3 fL (ref 80.0–100.0)
MCV: 94.3 fL (ref 80.0–100.0)
Platelets: 217 10*3/uL (ref 150–400)
Platelets: 226 10*3/uL (ref 150–400)
RBC: 2.96 MIL/uL — ABNORMAL LOW (ref 4.22–5.81)
RBC: 3 MIL/uL — ABNORMAL LOW (ref 4.22–5.81)
RDW: 15.3 % (ref 11.5–15.5)
RDW: 15.4 % (ref 11.5–15.5)
WBC: 3.8 10*3/uL — ABNORMAL LOW (ref 4.0–10.5)
WBC: 4.2 10*3/uL (ref 4.0–10.5)
nRBC: 0 % (ref 0.0–0.2)
nRBC: 0 % (ref 0.0–0.2)

## 2022-08-19 LAB — CREATININE, SERUM
Creatinine, Ser: 2.02 mg/dL — ABNORMAL HIGH (ref 0.61–1.24)
GFR, Estimated: 35 mL/min — ABNORMAL LOW (ref >60)

## 2022-08-19 LAB — APTT: aPTT: 27 seconds (ref 24–36)

## 2022-08-19 LAB — SODIUM, URINE, RANDOM: Sodium, Ur: 77 mmol/L

## 2022-08-19 LAB — D-DIMER, QUANTITATIVE: D-Dimer, Quant: 0.43 ug/mL-FEU (ref 0.00–0.50)

## 2022-08-19 LAB — CBG MONITORING, ED
Glucose-Capillary: 105 mg/dL — ABNORMAL HIGH (ref 70–99)
Glucose-Capillary: 131 mg/dL — ABNORMAL HIGH (ref 70–99)
Glucose-Capillary: 59 mg/dL — ABNORMAL LOW (ref 70–99)
Glucose-Capillary: 79 mg/dL (ref 70–99)

## 2022-08-19 LAB — CULTURE, BLOOD (ROUTINE X 2): Special Requests: ADEQUATE

## 2022-08-19 LAB — HIV ANTIBODY (ROUTINE TESTING W REFLEX): HIV Screen 4th Generation wRfx: NONREACTIVE

## 2022-08-19 MED ORDER — DEXTROSE 50 % IV SOLN
12.5000 g | INTRAVENOUS | Status: AC
  Administered 2022-08-19: 12.5 g via INTRAVENOUS
  Filled 2022-08-19: qty 50

## 2022-08-19 MED ORDER — SODIUM CHLORIDE 0.9 % IV SOLN: INTRAVENOUS | Status: DC

## 2022-08-19 NOTE — ED Notes (Signed)
Pt given 2 OJ and a sandwhich box

## 2022-08-19 NOTE — Progress Notes (Signed)
  Progress Note   Patient: Nicholas Weiss ZOX:[id number] DOB: 03-10-1951 DOA: 08/18/2022     0 DOS: the patient was seen and examined on 08/19/2022   Subjective:  Patient seen and examined at bedside this morning Denies nausea vomiting chest pain cough    Brief hospital course: Nicholas Weiss is a 72 y.o. male with medical history significant of Beatties mellitus on insulin at home reportedly has chronic fatigue.  As per history obtained from patient's partner by the name of Nicholas Weiss over the phone, patient often gets to the dining table and then puts his head down on the dining table and rests, he often lays down on the left seat in the living space, for prolonged periods of time.  In spite of this patient does able to get to the mailbox outside which is about 100 feet away and back.  Also this causes him shortness of breath.  Patient was in his usual state of health earlier today as well.  There has been no fever nausea vomiting diarrhea or any new aches or pains.  Patient continued to have marked fatigue, laying around the house.    At approximately 3 PM, patient had gone to a local hardware store along with his partner Nicholas Weiss to get some supplies.  As the patient got out of the car he was noted to "almost pass out ".  Family member grabbed the patient and prevented the patient from falling down on the ground.  Subsequently patient regained comfort composure and was brought to [facility] ER.  Family member reports that the patient was also diaphoretic at the time.   The entirety of the above history was obtained from patient's partner by the name of Nicholas Weiss.  Over the phone.  Patient denied every complaint to me.  Including he could not tell me why the patient was brought to the hospital at all.  He denied any loss of consciousness any chest pains any trouble breathing.  In spite of patient's denial patient was noted to be fully oriented to location, year, month, date.  And self  Assessment and  Plan:  Postural dizziness with presyncope likely in the setting of drug use Will obtain orthostatic blood pressure Follow-up on echocardiogram Follow-up on repeat twelve-lead EKG    Polysubstance use disorder Patient uses cocaine and marijuana Consultants sensation  History of multiple myeloma Outpatient follow-up with oncologist  AKI (acute kidney injury) (HCC) on CKD stage IIIa Continue current IV fluid Monitor renal function closely Need to potassium levels closely.   Advance Care Planning:   Code Status: Full Code      Family Communication: discussed with family over the phone  Physical Exam: General: Well-built gentleman that did not appear to be in any distress CNS: Alert and oriented x 3 Respiratory exam: Bilateral air entry vesicular Cardiovascular exam S1 is normal Abdomen soft nontender Extremities warm without edema  Vitals:   08/19/22 1138 08/19/22 1200 08/19/22 1535 08/19/22 1547  BP:  (!) 138/90  121/83  Pulse:  78  74  Resp:  16 11 17   Temp: 97.7 F (36.5 C)  97.7 F (36.5 C) 98.1 F (36.7 C)  TempSrc:      SpO2:  100%  100%  Weight:        Data Reviewed:I have personally reviewed patient's laboratory results showing sodium 134 potassium 5.2 creatinine 1.7  Time spent: 50 minutes  Author: Loyce Dys, MD 08/19/2022 4:39 PM  For on call review [email or web address].

## 2022-08-19 NOTE — Progress Notes (Signed)
*  PRELIMINARY RESULTS* Echocardiogram 2D Echocardiogram has been performed.  Nicholas Weiss 08/19/2022, 10:52 AM

## 2022-08-19 NOTE — ED Notes (Signed)
Waiting on semglee from pharmacy °

## 2022-08-19 NOTE — ED Notes (Signed)
Report to johnr, n.

## 2022-08-19 NOTE — ED Notes (Signed)
Pt moved into hospital bed for comfort. Po fluids provided.

## 2022-08-19 NOTE — ED Notes (Signed)
Iv catheter kinked, removed. Attempt iv insertion x1 without success.

## 2022-08-19 NOTE — ED Notes (Signed)
Pt eating breakfast 

## 2022-08-20 DIAGNOSIS — R55 Syncope and collapse: Secondary | ICD-10-CM | POA: Diagnosis not present

## 2022-08-20 LAB — THYROID PANEL WITH TSH
Free Thyroxine Index: 1.6 (ref 1.2–4.9)
T3 Uptake Ratio: 27 % (ref 24–39)
T4, Total: 5.9 ug/dL (ref 4.5–12.0)
TSH: 1.76 u[IU]/mL (ref 0.450–4.500)

## 2022-08-20 LAB — BASIC METABOLIC PANEL
Anion gap: 6 (ref 5–15)
BUN: 30 mg/dL — ABNORMAL HIGH (ref 8–23)
CO2: 24 mmol/L (ref 22–32)
Calcium: 8.3 mg/dL — ABNORMAL LOW (ref 8.9–10.3)
Chloride: 104 mmol/L (ref 98–111)
Creatinine, Ser: 1.4 mg/dL — ABNORMAL HIGH (ref 0.61–1.24)
GFR, Estimated: 54 mL/min — ABNORMAL LOW (ref >60)
Glucose, Bld: 66 mg/dL — ABNORMAL LOW (ref 70–99)
Potassium: 4.5 mmol/L (ref 3.5–5.1)
Sodium: 134 mmol/L — ABNORMAL LOW (ref 135–145)

## 2022-08-20 LAB — CBC WITH DIFFERENTIAL/PLATELET
Abs Immature Granulocytes: 0.01 10*3/uL (ref 0.00–0.07)
Basophils Absolute: 0 10*3/uL (ref 0.0–0.1)
Basophils Relative: 0 %
Eosinophils Absolute: 0.1 10*3/uL (ref 0.0–0.5)
Eosinophils Relative: 2 %
HCT: 26.6 % — ABNORMAL LOW (ref 39.0–52.0)
Hemoglobin: 8.9 g/dL — ABNORMAL LOW (ref 13.0–17.0)
Immature Granulocytes: 0 %
Lymphocytes Relative: 39 %
Lymphs Abs: 0.9 10*3/uL (ref 0.7–4.0)
MCH: 30.6 pg (ref 26.0–34.0)
MCHC: 33.5 g/dL (ref 30.0–36.0)
MCV: 91.4 fL (ref 80.0–100.0)
Monocytes Absolute: 0.2 10*3/uL (ref 0.1–1.0)
Monocytes Relative: 9 %
Neutro Abs: 1.2 10*3/uL — ABNORMAL LOW (ref 1.7–7.7)
Neutrophils Relative %: 50 %
Platelets: 185 10*3/uL (ref 150–400)
RBC: 2.91 MIL/uL — ABNORMAL LOW (ref 4.22–5.81)
RDW: 15.1 % (ref 11.5–15.5)
WBC: 2.4 10*3/uL — ABNORMAL LOW (ref 4.0–10.5)
nRBC: 0 % (ref 0.0–0.2)

## 2022-08-20 LAB — GLUCOSE, CAPILLARY
Glucose-Capillary: 100 mg/dL — ABNORMAL HIGH (ref 70–99)
Glucose-Capillary: 47 mg/dL — ABNORMAL LOW (ref 70–99)
Glucose-Capillary: 70 mg/dL (ref 70–99)
Glucose-Capillary: 228 mg/dL — ABNORMAL HIGH (ref 70–99)
Glucose-Capillary: 262 mg/dL — ABNORMAL HIGH (ref 70–99)
Glucose-Capillary: 161 mg/dL — ABNORMAL HIGH (ref 70–99)

## 2022-08-20 LAB — CULTURE, BLOOD (ROUTINE X 2)

## 2022-08-20 LAB — LYME DISEASE SEROLOGY W/REFLEX: Lyme Total Antibody EIA: NEGATIVE

## 2022-08-20 MED ORDER — DEXTROSE-SODIUM CHLORIDE 5-0.9 % IV SOLN: INTRAVENOUS | Status: DC

## 2022-08-20 MED ORDER — MIDODRINE HCL 5 MG PO TABS
5.0000 mg | ORAL_TABLET | Freq: Two times a day (BID) | ORAL | Status: DC
  Administered 2022-08-20 – 2022-08-22 (×4): 5 mg via ORAL
  Filled 2022-08-20 (×4): qty 1

## 2022-08-20 MED ORDER — AMLODIPINE BESYLATE 5 MG PO TABS
5.0000 mg | ORAL_TABLET | Freq: Every day | ORAL | Status: DC
  Administered 2022-08-20 – 2022-08-22 (×2): 5 mg via ORAL
  Filled 2022-08-20 (×2): qty 1

## 2022-08-20 NOTE — Progress Notes (Signed)
Progress Note   Patient: Nicholas Weiss:[id number] DOB: [birth year] DOA: 08/18/2022     1 DOS: the patient was seen and examined on 08/20/2022   Subjective:  Patient seen and examined at bedside this morning Denies nausea vomiting chest pain cough He was significantly orthostatic this morning with blood pressure laying down of 126/80 sitting 113/72 and standing 83/57 and after 3 minutes of standing blood pressure dropped to 67/53. Midodrine have been added to patient's medication  Brief hospital course: From HPI "Nicholas Weiss is a 72 y.o. male with medical history significant of Beatties mellitus on insulin at home reportedly has chronic fatigue.  As per history obtained from patient's partner by the name of Nicholas Weiss over the phone, patient often gets to the dining table and then puts his head down on the dining table and rests, he often lays down on the left seat in the living space, for prolonged periods of time.  In spite of this patient does able to get to the mailbox outside which is about 100 feet away and back.  Also this causes him shortness of breath.  Patient was in his usual state of health earlier today as well.  There has been no fever nausea vomiting diarrhea or any new aches or pains.  Patient continued to have marked fatigue, laying around the house.    At approximately 3 PM, patient had gone to a local hardware store along with his partner Nicholas Weiss to get some supplies.  As the patient got out of the car he was noted to "almost pass out ".  Family member grabbed the patient and prevented the patient from falling down on the ground.  Subsequently patient regained comfort composure and was brought to [facility] ER.  Family member reports that the patient was also diaphoretic at the time.   The entirety of the above history was obtained from patient's partner by the name of Nicholas Weiss.  Over the phone.  Patient denied every complaint to me.  Including he could not tell me why the patient was  brought to the hospital at all.  He denied any loss of consciousness any chest pains any trouble breathing.  In spite of patient's denial patient was noted to be fully oriented to location, year, month, date.  "   Assessment and Plan:   Postural dizziness with presyncope likely in the setting of drug use As well as orthostatic hypotension He was significantly orthostatic on 08/20/2022 with blood pressure laying down of 126/80 sitting 113/72 and standing 83/57 and after 3 minutes of standing blood pressure dropped to 67/53. Midodrine have been added to patient's medication Echo showed EF 60 to 65% Repeat EKG showed first-degree AV block Avoiding beta-blockers      Polysubstance use disorder Patient uses cocaine and marijuana Counseled on cessation   History of multiple myeloma Outpatient follow-up with oncologist   AKI (acute kidney injury) (HCC) on CKD stage IIIa Continue current IV fluid Monitor renal function closely Need to potassium levels closely.   Advance Care Planning:   Code Status: Full Code      Family Communication: discussed with family over the phone   Physical Exam: General: Well-built gentleman that did not appear to be in any distress CNS: Alert and oriented x 3 Respiratory exam: Bilateral air entry vesicular Cardiovascular exam S1 is normal Abdomen soft nontender Extremities warm without edema     Data Reviewed:I have personally reviewed patient's laboratory results showing sodium 134 potassium 4.5 creatinine 1.4  Time spent: 43 minutes   Vitals:   08/20/22 0028 08/20/22 0447 08/20/22 0741 08/20/22 1149  BP: (!) 116/96 120/75 125/86 131/77  Pulse: 80 72 78 82  Resp: 12 16 19 16   Temp: 98.5 F (36.9 C) 98.6 F (37 C) 97.6 F (36.4 C) 98.7 F (37.1 C)  TempSrc:  Oral Oral Oral  SpO2: 100% 100% 100% 100%  Weight:        Author: Loyce Dys, MD 08/20/2022 1:33 PM  For on call review [email or web address].

## 2022-08-20 NOTE — Inpatient Diabetes Management (Signed)
Inpatient Diabetes Program Recommendations  AACE/ADA: New Consensus Statement on Inpatient Glycemic Control (2015)  Target Ranges:  Prepandial:   less than 140 mg/dL      Peak postprandial:   less than 180 mg/dL (1-2 hours)      Critically ill patients:  140 - 180 mg/dL    Latest Reference Range & Units 08/19/22 00:49 08/19/22 07:52 08/19/22 12:15 08/19/22 15:57 08/19/22 16:51 08/19/22 21:55 08/19/22 22:38 08/19/22 23:05  Glucose-Capillary 70 - 99 mg/dL 161 (H) 79  13 units [place] @0943  131 (H) 69 (L) 99 65 (L) 64 (L)  25 ml D50% 201 (H)  (H): Data is abnormally high (L): Data is abnormally low  Latest Reference Range & Units 08/20/22 07:38 08/20/22 08:01  Glucose-Capillary 70 - 99 mg/dL 47 (L) 70  (L): Data is abnormally low    History: T1DM, Multiple Myeloma  Home DM Meds: Tresiba 13 units Daily       Novolog 0-9 units per SSI       Dexcom G7 CGM  Current Orders: Semglee 13 units Daily     Novolog 0-6 units TID ac/hs    MD- Note Severe Hypoglycemia this AM.  Needs some basal insulin on board due to Type 1 diabetes and inability to produce any endogenous insulin.  Please consider reducing the Semglee to 9 units Daily (30% reduction)  Please make sure pt does get some Semglee today      ENDO: [facility] Last seen 08/11/2022 by the PA Was told to Increase Tresiba to 15 units Daily and Take Novolog 3-10 units per scale (if eating) and Novolog 2-4 units per scale (if not eating)    --Will follow patient during hospitalization--  Ambrose Finland RN, MSN, CDCES Diabetes Coordinator Inpatient Glycemic Control Team Team Pager: [phone] (8a-5p)

## 2022-08-20 NOTE — Progress Notes (Addendum)
Tele called me (RN5) at [phone] regarding patient's leads being off during shift report. I had not gotten a call regarding this patient's tele until then. Went into room and asked patient why he took his tele monitor off. Patient stated that "she" took it off. Reported this to day nurse.

## 2022-08-21 ENCOUNTER — Inpatient Hospital Stay: Payer: 59

## 2022-08-21 ENCOUNTER — Inpatient Hospital Stay: Payer: 59 | Admitting: Oncology

## 2022-08-21 DIAGNOSIS — R55 Syncope and collapse: Secondary | ICD-10-CM | POA: Diagnosis not present

## 2022-08-21 LAB — CBC WITH DIFFERENTIAL/PLATELET
Abs Immature Granulocytes: 0.01 10*3/uL (ref 0.00–0.07)
Basophils Absolute: 0 10*3/uL (ref 0.0–0.1)
Basophils Relative: 0 %
Eosinophils Absolute: 0.1 10*3/uL (ref 0.0–0.5)
Eosinophils Relative: 1 %
HCT: 25.5 % — ABNORMAL LOW (ref 39.0–52.0)
Hemoglobin: 8.6 g/dL — ABNORMAL LOW (ref 13.0–17.0)
Immature Granulocytes: 0 %
Lymphocytes Relative: 20 %
Lymphs Abs: 0.8 10*3/uL (ref 0.7–4.0)
MCH: 31 pg (ref 26.0–34.0)
MCHC: 33.7 g/dL (ref 30.0–36.0)
MCV: 92.1 fL (ref 80.0–100.0)
Monocytes Absolute: 0.3 10*3/uL (ref 0.1–1.0)
Monocytes Relative: 8 %
Neutro Abs: 2.7 10*3/uL (ref 1.7–7.7)
Neutrophils Relative %: 71 %
Platelets: 191 10*3/uL (ref 150–400)
RBC: 2.77 MIL/uL — ABNORMAL LOW (ref 4.22–5.81)
RDW: 14.9 % (ref 11.5–15.5)
WBC: 3.8 10*3/uL — ABNORMAL LOW (ref 4.0–10.5)
nRBC: 0 % (ref 0.0–0.2)

## 2022-08-21 LAB — GLUCOSE, CAPILLARY
Glucose-Capillary: 143 mg/dL — ABNORMAL HIGH (ref 70–99)
Glucose-Capillary: 228 mg/dL — ABNORMAL HIGH (ref 70–99)
Glucose-Capillary: 358 mg/dL — ABNORMAL HIGH (ref 70–99)
Glucose-Capillary: 431 mg/dL — ABNORMAL HIGH (ref 70–99)
Glucose-Capillary: 77 mg/dL (ref 70–99)

## 2022-08-21 LAB — BASIC METABOLIC PANEL
Anion gap: 4 — ABNORMAL LOW (ref 5–15)
BUN: 32 mg/dL — ABNORMAL HIGH (ref 8–23)
CO2: 21 mmol/L — ABNORMAL LOW (ref 22–32)
Calcium: 8.2 mg/dL — ABNORMAL LOW (ref 8.9–10.3)
Chloride: 105 mmol/L (ref 98–111)
Creatinine, Ser: 1.43 mg/dL — ABNORMAL HIGH (ref 0.61–1.24)
GFR, Estimated: 53 mL/min — ABNORMAL LOW (ref >60)
Glucose, Bld: 475 mg/dL — ABNORMAL HIGH (ref 70–99)
Potassium: 5.2 mmol/L — ABNORMAL HIGH (ref 3.5–5.1)
Sodium: 130 mmol/L — ABNORMAL LOW (ref 135–145)

## 2022-08-21 LAB — CULTURE, BLOOD (ROUTINE X 2): Culture: NO GROWTH

## 2022-08-21 MED ORDER — INSULIN ASPART 100 UNIT/ML IJ SOLN
8.0000 [IU] | Freq: Once | INTRAMUSCULAR | Status: AC
  Administered 2022-08-21: 8 [IU] via SUBCUTANEOUS
  Filled 2022-08-21: qty 1

## 2022-08-21 MED ORDER — SODIUM CHLORIDE 0.9 % IV SOLN: INTRAVENOUS | Status: DC

## 2022-08-21 NOTE — Evaluation (Signed)
Physical Therapy Evaluation Patient Details Name: Nicholas Weiss MRN: [id number] DOB: [birth year] Today's Date: 08/21/2022  History of Present Illness  presented to ER and admitted for workup related to syncopal episode  Clinical Impression  Resting in bed upon arrival to room; alert and oriented to basic information, follows commands, pleasant and agreeable throughout session.  Denies pain.  Bilat UE/LE strength and ROM grossly symmetrical and WFL; no focal weakness appreciated.  Able to complete bed mobility with indep; sit/stand, basic transfers and standing balance without assist device, close sup.  Slightly impulsive, but easily redirectable. Noted with significant drop in BP with transition to upright; absent accommodation to position with therex, sustained positioning.  Vitals noted below; RN informed/aware.  Patient grossly asymptomatic, placing patient at very high risk for falls with continued BP drop.  Additional OOB/mobility deferred as result. (Of note, patient with very similar medical presentation on previous hospital admission; orthostasis appears to be a more chronic issue that will benefit from longer-term management). Would benefit from skilled PT to address above deficits and promote optimal return to PLOF.; recommend post-acute PT follow up as indicated by interdisciplinary care team.   May progress to no formal PT needs once medically optimized.  Orthostatic VS for the past 24 hrs (Last 3 readings):  BP- Lying Pulse- Lying BP- Sitting Pulse- Sitting BP- Standing at 0 minutes Pulse- Standing at 0 minutes BP- Standing at 3 minutes Pulse- Standing at 3 minutes  08/21/22 1411 120/76 72 (!) 81/62 84 (!) 85/56 87 (!) 61/48 96         Recommendations for follow up therapy are one component of a multi-disciplinary discharge planning process, led by the attending physician.  Recommendations may be updated based on patient status, additional functional criteria and insurance  authorization.  Follow Up Recommendations       Assistance Recommended at Discharge PRN  Patient can return home with the following  A little help with walking and/or transfers;A little help with bathing/dressing/bathroom    Equipment Recommendations    Recommendations for Other Services       Functional Status Assessment Patient has had a recent decline in their functional status and demonstrates the ability to make significant improvements in function in a reasonable and predictable amount of time.     Precautions / Restrictions Precautions Precautions: Fall Restrictions Weight Bearing Restrictions: No      Mobility  Bed Mobility Overal bed mobility: Modified Independent                  Transfers Overall transfer level: Needs assistance   Transfers: Sit to/from Stand, Bed to chair/wheelchair/BSC Sit to Stand: Supervision Stand pivot transfers: Supervision              Ambulation/Gait               General Gait Details: deferred due to significant orthostasis (generally asymptomatic)  Stairs            Wheelchair Mobility    Modified Rankin (Stroke Patients Only)       Balance Overall balance assessment: Needs assistance Sitting-balance support: No upper extremity supported, Feet supported Sitting balance-Leahy Scale: Good     Standing balance support: No upper extremity supported Standing balance-Leahy Scale: Fair                               Pertinent Vitals/Pain Pain Assessment Pain Assessment: No/denies pain  Home Living Family/patient expects to be discharged to:: Private residence Living Arrangements: Spouse/significant other Available Help at Discharge: Family;Available 24 hours/day Type of Home: House Home Access: Stairs to enter Entrance Stairs-Rails: Can reach both Entrance Stairs-Number of Steps: 1 STE bil railing   Home Layout: One level Home Equipment: Cane - single point;Tub bench;Grab  bars - toilet;Grab bars - tub/shower;Rolling Walker (2 wheels)      Prior Function Prior Level of Function : Independent/Modified Independent             Mobility Comments: Mod indep for ADLs, household and community mobilization; intermittent use of SPC.  Denies additional fall history, though question accuracy or report       Hand Dominance   Dominant Hand: Right    Extremity/Trunk Assessment   Upper Extremity Assessment Upper Extremity Assessment: Overall WFL for tasks assessed (grossly at least 4/5 throughout)    Lower Extremity Assessment Lower Extremity Assessment: Overall WFL for tasks assessed (grossly at least 4/5 throughout)       Communication   Communication: No difficulties  Cognition Arousal/Alertness: Awake/alert Behavior During Therapy: WFL for tasks assessed/performed Overall Cognitive Status: Within Functional Limits for tasks assessed                                 General Comments: Alert and oriented to basic information; follows commands, pleasant and agreeable.  Minimal/no insight into deficits and overall safety risks        General Comments      Exercises     Assessment/Plan    PT Assessment Patient needs continued PT services  PT Problem List Decreased activity tolerance;Decreased balance;Decreased mobility;Decreased knowledge of use of DME;Decreased safety awareness;Decreased knowledge of precautions;Cardiopulmonary status limiting activity       PT Treatment Interventions DME instruction;Gait training;Stair training;Therapeutic activities;Functional mobility training;Therapeutic exercise;Balance training;Patient/family education    PT Goals (Current goals can be found in the Care Plan section)  Acute Rehab PT Goals Patient Stated Goal: to return home PT Goal Formulation: With patient Time For Goal Achievement: 09/04/22 Potential to Achieve Goals: Good    Frequency Min 2X/week     Co-evaluation                AM-PAC PT "6 Clicks" Mobility  Outcome Measure Help needed turning from your back to your side while in a flat bed without using bedrails?: None Help needed moving from lying on your back to sitting on the side of a flat bed without using bedrails?: None Help needed moving to and from a bed to a chair (including a wheelchair)?: A Little Help needed standing up from a chair using your arms (e.g., wheelchair or bedside chair)?: A Little Help needed to walk in hospital room?: A Lot Help needed climbing 3-5 steps with a railing? : A Lot 6 Click Score: 18    End of Session Equipment Utilized During Treatment: Gait belt Activity Tolerance: Patient tolerated treatment well Patient left: in bed;with call bell/phone within reach;with bed alarm set Nurse Communication: Mobility status PT Visit Diagnosis: Difficulty in walking, not elsewhere classified (R26.2);History of falling (Z91.81)    Time: 1348-1410 PT Time Calculation (min) (ACUTE ONLY): 22 min   Charges:   PT Evaluation $PT Eval Moderate Complexity: 1 Mod          Jaqualin Serpa H. Manson Passey, PT, DPT, NCS 08/21/22, 2:34 PM [phone]

## 2022-08-21 NOTE — TOC Progression Note (Addendum)
Transition of Care [facility]) - Progression Note    Patient Details  Name: Nicholas Weiss MRN: [id number] Date of Birth: May 03, 1950  [facility] CM/SW Contact  Allena Katz, LCSW Phone Number: 08/21/2022, 10:01 AM  Clinical Narrative:     CSW attempted to complete HRA. Pt is not fully oriented. CSW attempted to reach patients friend sharon and was unable to leave a VM due to her VM box being full.  Per chart review pt was at [facility] this year in February for rehab. TOC will continue to try to reach friend and follow up as patients mental status clears.         Expected Discharge Plan and Services                                               Social Determinants of Health (SDOH) Interventions SDOH Screenings   Food Insecurity: No Food Insecurity (08/19/2022)  Housing: Low Risk  (08/19/2022)  Transportation Needs: No Transportation Needs (08/19/2022)  Utilities: Not At Risk (08/19/2022)  Alcohol Screen: Low Risk  (10/02/2020)  Depression (PHQ2-9): Medium Risk (10/02/2020)  Financial Resource Strain: Low Risk  (01/01/2020)  Physical Activity: Insufficiently Active (01/01/2020)  Social Connections: Moderately Isolated (01/01/2020)  Stress: No Stress Concern Present (01/01/2020)  Tobacco Use: Low Risk  (08/18/2022)    Readmission Risk Interventions    05/20/2022   11:41 AM 03/12/2022   10:22 AM 02/25/2022    2:43 PM  Readmission Risk Prevention Plan  Transportation Screening Complete Complete Complete  Medication Review Oceanographer) Complete Complete Complete  PCP or Specialist appointment within 3-5 days of discharge Complete Complete Complete  HRI or Home Care Consult Complete Complete Complete  SW Recovery Care/Counseling Consult  Complete   Palliative Care Screening Complete Not Applicable Not Applicable  Skilled Nursing Facility Complete Not Applicable Not Applicable

## 2022-08-21 NOTE — Progress Notes (Signed)
Progress Note   Patient: Nicholas Weiss ZOX:[id number] DOB: 1950-07-15 DOA: 08/18/2022     2 DOS: the patient was seen and examined on 08/21/2022    Subjective:  Patient seen and examined at bedside this morning Denies nausea vomiting chest pain cough PT OT requested Midodrine have been added to patient's medication   Brief hospital course: From HPI "Nicholas Weiss is a 72 y.o. male with medical history significant of diabetes mellitus on insulin at home reportedly has chronic fatigue.  As per history obtained from patient's partner by the name of Nicholas Weiss over the phone, patient often gets to the dining table and then puts his head down on the dining table and rests, he often lays down on the left seat in the living space, for prolonged periods of time.  In spite of this patient does able to get to the mailbox outside which is about 100 feet away and back.  Also this causes him shortness of breath.  Patient was in his usual state of health earlier today as well.  There has been no fever nausea vomiting diarrhea or any new aches or pains.  Patient continued to have marked fatigue, laying around the house.    At approximately 3 PM, patient had gone to a local hardware store along with his partner Nicholas Weiss to get some supplies.  As the patient got out of the car he was noted to "almost pass out ".  Family member grabbed the patient and prevented the patient from falling down on the ground.  Subsequently patient regained comfort composure and was brought to [facility] ER.  Family member reports that the patient was also diaphoretic at the time.   The entirety of the above history was obtained from patient's partner by the name of Nicholas Weiss.  Over the phone.  Patient denied every complaint to me.  Including he could not tell me why the patient was brought to the hospital at all.  He denied any loss of consciousness any chest pains any trouble breathing.  In spite of patient's denial patient was noted to be fully  oriented to location, year, month, date.  "   Assessment and Plan:   Postural dizziness with presyncope likely in the setting of drug use As well as orthostatic hypotension He was significantly orthostatic on 08/20/2022 with blood pressure laying down of 126/80 sitting 113/72 and standing 83/57 and after 3 minutes of standing blood pressure dropped to 67/53. We will continue monitoring orthostatic blood pressure Midodrine have been added to patient's medication Echo showed EF 60 to 65% Repeat EKG showed first-degree AV block Avoiding beta-blockers      Polysubstance use disorder Patient uses cocaine and marijuana Counseled on cessation   History of multiple myeloma Outpatient follow-up with oncologist   AKI (acute kidney injury) (HCC) on CKD stage IIIa Continue current IV fluid Monitor renal function closely Need to potassium levels closely.   Diabetes mellitus with hyperglycemia Continue current insulin therapy Monitor glucose closely  Advance Care Planning:   Code Status: Full Code      Family Communication: No family at bedside for discussion   Physical Exam: General: Well-built gentleman that did not appear to be in any distress CNS: Alert and oriented x 3 Respiratory exam: Bilateral air entry vesicular Cardiovascular exam S1 is normal Abdomen soft nontender Extremities warm without edema     Data Reviewed:I have personally reviewed patient's laboratory results showing  Sodium 130 potassium 5.2 creatinine 1.4  Time spent: 40 minutes  Vitals:   08/21/22 0457 08/21/22 0512 08/21/22 0756 08/21/22 1149  BP: 102/75 104/69 95/63 95/69   Pulse: 93 95 97 81  Resp: 18 19 15 17   Temp: 98.2 F (36.8 C) 98 F (36.7 C) 98.4 F (36.9 C) 97.9 F (36.6 C)  TempSrc: Oral  Oral   SpO2: 99% 100% 98% 100%  Weight:         Author: Loyce Dys, MD 08/21/2022 2:20 PM  For on call review [email or web address].

## 2022-08-21 NOTE — Progress Notes (Signed)
At bedside to place PIV and patient does not want another PIV placed . He states there is no discomfort and the site is WNL with fluids going at 100 ML /hr. Will notify primary RN

## 2022-08-21 NOTE — Care Management Important Message (Signed)
Important Message  Patient Details  Name: Nicholas Weiss OR MRN: [id number] Date of Birth: [birth year]   Medicare Important Message Given:  N/A - LOS <3 / Initial given by admissions     Nicholas Weiss 08/21/2022, 10:57 AM

## 2022-08-21 NOTE — Inpatient Diabetes Management (Signed)
Inpatient Diabetes Program Recommendations  AACE/ADA: New Consensus Statement on Inpatient Glycemic Control (2015)  Target Ranges:  Prepandial:   less than 140 mg/dL      Peak postprandial:   less than 180 mg/dL (1-2 hours)      Critically ill patients:  140 - 180 mg/dL    Latest Reference Range & Units 08/20/22 07:38 08/20/22 08:01 08/20/22 11:50 08/20/22 15:57 08/20/22 21:49  Glucose-Capillary 70 - 99 mg/dL 47 (L) 70 409 (H) 811 (H) 161 (H)  (L): Data is abnormally low (H): Data is abnormally high  Latest Reference Range & Units 08/21/22 07:54  Glucose-Capillary 70 - 99 mg/dL 914 (H)  (H): Data is abnormally high   History: T1DM, Multiple Myeloma   Home DM Meds: Tresiba 13 units Daily       Novolog 0-9 units per SSI       Dexcom G7 CGM   Current Orders: Semglee 13 units Daily     Novolog 0-6 units TID ac/hs       MD- Note Severe Hypoglycemia yesterday AM.    Semglee Insulin dose was Held.  Needs some basal insulin on board due to Type 1 diabetes and inability to produce any endogenous insulin.  CBG 431 this AM   Please consider restarting the Semglee at 9 units Daily (30% reduction)   Please make sure pt gets Semglee today        ENDO: [facility] Last seen 08/11/2022 by the PA Was told to Increase Tresiba to 15 units Daily and Take Novolog 3-10 units per scale (if eating) and Novolog 2-4 units per scale (if not eating)       --Will follow patient during hospitalization--   Ambrose Finland RN, MSN, CDCES Diabetes Coordinator Inpatient Glycemic Control Team

## 2022-08-22 DIAGNOSIS — R55 Syncope and collapse: Secondary | ICD-10-CM | POA: Diagnosis not present

## 2022-08-22 LAB — CBC WITH DIFFERENTIAL/PLATELET
Abs Immature Granulocytes: 0.01 10*3/uL (ref 0.00–0.07)
Basophils Absolute: 0 10*3/uL (ref 0.0–0.1)
Basophils Relative: 0 %
Eosinophils Absolute: 0 10*3/uL (ref 0.0–0.5)
Eosinophils Relative: 1 %
HCT: 25.9 % — ABNORMAL LOW (ref 39.0–52.0)
Hemoglobin: 8.5 g/dL — ABNORMAL LOW (ref 13.0–17.0)
Immature Granulocytes: 0 %
Lymphocytes Relative: 16 %
Lymphs Abs: 0.4 10*3/uL — ABNORMAL LOW (ref 0.7–4.0)
MCH: 30.7 pg (ref 26.0–34.0)
MCHC: 32.8 g/dL (ref 30.0–36.0)
MCV: 93.5 fL (ref 80.0–100.0)
Monocytes Absolute: 0.2 10*3/uL (ref 0.1–1.0)
Monocytes Relative: 8 %
Neutro Abs: 1.8 10*3/uL (ref 1.7–7.7)
Neutrophils Relative %: 75 %
Platelets: 153 10*3/uL (ref 150–400)
RBC: 2.77 MIL/uL — ABNORMAL LOW (ref 4.22–5.81)
RDW: 15 % (ref 11.5–15.5)
WBC: 2.4 10*3/uL — ABNORMAL LOW (ref 4.0–10.5)
nRBC: 0 % (ref 0.0–0.2)

## 2022-08-22 LAB — BASIC METABOLIC PANEL
Anion gap: 5 (ref 5–15)
BUN: 25 mg/dL — ABNORMAL HIGH (ref 8–23)
CO2: 21 mmol/L — ABNORMAL LOW (ref 22–32)
Calcium: 8.1 mg/dL — ABNORMAL LOW (ref 8.9–10.3)
Chloride: 106 mmol/L (ref 98–111)
Creatinine, Ser: 1.48 mg/dL — ABNORMAL HIGH (ref 0.61–1.24)
GFR, Estimated: 51 mL/min — ABNORMAL LOW (ref >60)
Glucose, Bld: 284 mg/dL — ABNORMAL HIGH (ref 70–99)
Potassium: 4.3 mmol/L (ref 3.5–5.1)
Sodium: 132 mmol/L — ABNORMAL LOW (ref 135–145)

## 2022-08-22 LAB — GLUCOSE, CAPILLARY
Glucose-Capillary: 205 mg/dL — ABNORMAL HIGH (ref 70–99)
Glucose-Capillary: 111 mg/dL — ABNORMAL HIGH (ref 70–99)
Glucose-Capillary: 211 mg/dL — ABNORMAL HIGH (ref 70–99)
Glucose-Capillary: 144 mg/dL — ABNORMAL HIGH (ref 70–99)

## 2022-08-22 LAB — CULTURE, BLOOD (ROUTINE X 2): Culture: NO GROWTH

## 2022-08-22 MED ORDER — MIDODRINE HCL 5 MG PO TABS
5.0000 mg | ORAL_TABLET | Freq: Three times a day (TID) | ORAL | Status: DC
  Administered 2022-08-22 – 2022-08-24 (×6): 5 mg via ORAL
  Filled 2022-08-22 (×6): qty 1

## 2022-08-22 NOTE — Progress Notes (Signed)
Progress Note   Patient: Nicholas Weiss GNF:[id number] DOB: [birth year] DOA: 08/18/2022     3 DOS: the patient was seen and examined on 08/22/2022   Subjective:  Patient seen and examined at bedside this morning Denies nausea vomiting chest pain cough He denies any dizziness upon standing up   Brief hospital course: From HPI "DARAL MCCLAREN is a 72 y.o. male with medical history significant of diabetes mellitus on insulin at home reportedly has chronic fatigue.  As per history obtained from patient's partner by the name of [name] over the phone, patient often gets to the dining table and then puts his head down on the dining table and rests, he often lays down on the left seat in the living space, for prolonged periods of time.  In spite of this patient does able to get to the mailbox outside which is about 100 feet away and back.  Also this causes him shortness of breath.  Patient was in his usual state of health earlier today as well.  There has been no fever nausea vomiting diarrhea or any new aches or pains.  Patient continued to have marked fatigue, laying around the house.    At approximately 3 PM, patient had gone to a local hardware store along with his partner [name] to get some supplies.  As the patient got out of the car he was noted to "almost pass out ".  Family member grabbed the patient and prevented the patient from falling down on the ground.  Subsequently patient regained comfort composure and was brought to [facility] ER.  Family member reports that the patient was also diaphoretic at the time.   The entirety of the above history was obtained from patient's partner by the name of [name].  Over the phone.  Patient denied every complaint to me.  Including he could not tell me why the patient was brought to the hospital at all.  He denied any loss of consciousness any chest pains any trouble breathing.  In spite of patient's denial patient was noted to be fully oriented to location, year,  month, date.  "   Assessment and Plan:   Postural dizziness with presyncope likely in the setting of drug use As well as orthostatic hypotension due to dehydration He was significantly orthostatic on 08/20/2022 with blood pressure laying down of 126/80 sitting 113/72 and standing 83/57 and after 3 minutes of standing blood pressure dropped to 67/53. We will continue monitoring orthostatic blood pressure Midodrine frequency have been increased to 5MG  3 times daily Amlodipine have been discontinued Echo showed EF 60 to 65% Repeat EKG showed first-degree AV block Avoiding beta-blockers  Continue IV fluid resuscitation   Polysubstance use disorder Patient uses cocaine and marijuana Counseled on cessation   Essential hypertension Patient used to be on amlodipine 10 mg daily however this is being withheld on account of orthostatic hypotension  History of multiple myeloma Outpatient follow-up with oncologist   AKI (acute kidney injury) (HCC) on CKD stage IIIa Continue current IV fluid Monitor renal function closely Need to potassium levels closely.   Diabetes mellitus with hyperglycemia Continue current insulin therapy Monitor glucose closely   Advance Care Planning:   Code Status: Full Code      Family Communication: No family at bedside for discussion   Physical Exam: General: Well-built gentleman that did not appear to be in any distress CNS: Alert and oriented x 3 Respiratory exam: Bilateral air entry vesicular Cardiovascular exam S1 is normal Abdomen  soft nontender Extremities warm without edema     Data Reviewed:I have personally reviewed patient's laboratory results showing  Sodium 132 potassium 4.3 creatinine 1.48   Time spent: 40 minutes       Vitals:   08/21/22 2349 08/22/22 0516 08/22/22 0805 08/22/22 1148  BP: 96/67 116/73 106/78 127/75  Pulse: 87 77 79 74  Resp: 19 18 16 16   Temp: 98.6 F (37 C) 98.7 F (37.1 C) 98.6 F (37 C) 98.2 F (36.8 C)   TempSrc:   Oral Oral  SpO2: 100% 100% 100% 100%  Weight:         Author: Loyce Dys, MD 08/22/2022 1:36 PM  For on call review [email or web address].

## 2022-08-23 DIAGNOSIS — R55 Syncope and collapse: Secondary | ICD-10-CM | POA: Diagnosis not present

## 2022-08-23 LAB — GLUCOSE, CAPILLARY
Glucose-Capillary: 35 mg/dL — CL (ref 70–99)
Glucose-Capillary: 187 mg/dL — ABNORMAL HIGH (ref 70–99)
Glucose-Capillary: 230 mg/dL — ABNORMAL HIGH (ref 70–99)
Glucose-Capillary: 245 mg/dL — ABNORMAL HIGH (ref 70–99)
Glucose-Capillary: 247 mg/dL — ABNORMAL HIGH (ref 70–99)
Glucose-Capillary: 183 mg/dL — ABNORMAL HIGH (ref 70–99)

## 2022-08-23 LAB — BASIC METABOLIC PANEL
Anion gap: 4 — ABNORMAL LOW (ref 5–15)
BUN: 19 mg/dL (ref 8–23)
CO2: 24 mmol/L (ref 22–32)
Calcium: 8 mg/dL — ABNORMAL LOW (ref 8.9–10.3)
Chloride: 107 mmol/L (ref 98–111)
Creatinine, Ser: 1.18 mg/dL (ref 0.61–1.24)
GFR, Estimated: >60 mL/min (ref >60)
Glucose, Bld: 40 mg/dL — CL (ref 70–99)
Potassium: 3.5 mmol/L (ref 3.5–5.1)
Sodium: 135 mmol/L (ref 135–145)

## 2022-08-23 LAB — CULTURE, BLOOD (ROUTINE X 2)

## 2022-08-23 MED ORDER — DEXTROSE 50 % IV SOLN
INTRAVENOUS | Status: AC
  Filled 2022-08-23: qty 50

## 2022-08-23 MED ORDER — DEXTROSE 50 % IV SOLN
1.0000 | Freq: Once | INTRAVENOUS | Status: AC
  Administered 2022-08-23: 50 mL via INTRAVENOUS

## 2022-08-23 MED ORDER — INSULIN GLARGINE-YFGN 100 UNIT/ML ~~LOC~~ SOLN
10.0000 [IU] | Freq: Every day | SUBCUTANEOUS | Status: DC
  Administered 2022-08-23 – 2022-08-25 (×3): 10 [IU] via SUBCUTANEOUS
  Filled 2022-08-23 (×4): qty 0.1

## 2022-08-23 NOTE — Consult Note (Signed)
CARDIOLOGY CONSULT NOTE               Patient ID: Nicholas Weiss MRN: [id number] DOB/AGE: March 14, 1951 72 y.o.  Admit date: 08/18/2022 Referring Physician Dr. Rosezetta Schlatter [facility]) Primary Physician Dr. Burnadette Pop primary Primary Cardiologist  Reason for Consultation weakness near syncope hypotension  HPI: Patient is a 72 year old male history of diabetes hypertension chronic fatigue multiple myeloma reportedly has had lightheadedness dizziness weakness fatigue was found to be potentially dehydrated with history of chronic renal sufficiency now with evidence of acute on chronic patient has a history of substance abuse he has had trouble with near syncope weakness fatigue came to the emergency room for evaluation found to be orthostatic dehydrated was advised to be admitted for further evaluation and management  Review of systems complete and found to be negative unless listed above     Past Medical History:  Diagnosis Date   Acute metabolic encephalopathy 12/02/2020   DKA (diabetic ketoacidoses) 04/06/2016   Hypercholesteremia    Hypertension     Past Surgical History:  Procedure Laterality Date   AMPUTATION TOE Left 04/25/2022   Procedure: LEFT GREAT TOE AMPUTATION AND LEFT PARTIAL 2ND TOE AMPUTATION;  Surgeon: Felecia Shelling, DPM;  Location: [facility] ORS;  Service: Podiatry;  Laterality: Left;   COLONOSCOPY WITH PROPOFOL N/A 02/01/2020   Procedure: COLONOSCOPY WITH PROPOFOL;  Surgeon: Toney Reil, MD;  Location: [facility] ENDOSCOPY;  Service: Gastroenterology;  Laterality: N/A;   ESOPHAGOGASTRODUODENOSCOPY  02/01/2020   Procedure: ESOPHAGOGASTRODUODENOSCOPY (EGD);  Surgeon: Toney Reil, MD;  Location: [facility] ENDOSCOPY;  Service: Gastroenterology;;   ESOPHAGOGASTRODUODENOSCOPY (EGD) WITH PROPOFOL N/A 06/07/2022   Procedure: ESOPHAGOGASTRODUODENOSCOPY (EGD) WITH PROPOFOL;  Surgeon: Kerin Salen, MD;  Location: [facility] ENDOSCOPY;  Service: Gastroenterology;  Laterality: N/A;    KNEE SURGERY Right    Torn meniscus   KNEE SURGERY Left     Medications Prior to Admission  Medication Sig Dispense Refill Last Dose   acetaminophen (TYLENOL) 500 MG tablet Take 1,000 mg by mouth daily as needed for mild pain or moderate pain.   unk at unk   acyclovir (ZOVIRAX) 400 MG tablet Take 1 tablet (400 mg total) by mouth 2 (two) times daily. 60 tablet 2 08/18/2022   amLODipine (NORVASC) 10 MG tablet Take 1 tablet (10 mg total) by mouth daily. 30 tablet 0 08/18/2022   ascorbic acid (VITAMIN C) 500 MG tablet Take 1 tablet (500 mg total) by mouth daily. 90 tablet 0 08/18/2022   aspirin EC 81 MG tablet Take 81 mg by mouth in the morning.   08/18/2022   atorvastatin (LIPITOR) 80 MG tablet Take 1 tablet (80 mg total) by mouth at bedtime. Hold while taking Paxlovid 30 tablet 2 08/17/2022   Baclofen 5 MG TABS Take 5 mg by mouth daily as needed (muscle spasm).   unk at unk   clopidogrel (PLAVIX) 75 MG tablet Take 75 mg by mouth in the morning.   08/18/2022   Continuous Glucose Sensor (DEXCOM G7 SENSOR) MISC Use 1 each every 10 (ten) days   08/18/2022   docusate sodium (COLACE) 100 MG capsule Take 100 mg by mouth daily as needed for mild constipation.   unk at unk   feeding supplement, GLUCERNA SHAKE, (GLUCERNA SHAKE) LIQD Take 237 mLs by mouth 3 (three) times daily between meals.  0 08/18/2022   Glucagon (BAQSIMI ONE PACK) 3 MG/DOSE POWD Place 3 mg into the nose once as needed for up to 1 dose (for low sugar). 1 each  1 unk at unk   hydrALAZINE (APRESOLINE) 10 MG tablet Take 1 tablet (10 mg total) by mouth every 8 (eight) hours. 90 tablet 0 08/18/2022   insulin degludec (TRESIBA FLEXTOUCH) 100 UNIT/ML FlexTouch Pen Inject 13 Units into the skin daily.   08/18/2022   [EXPIRED] Insulin Pen Needle (PEN NEEDLES 3/16") 31G X 5 MM MISC 1 Needle by Does not apply route 4 (four) times daily as needed. 1000 each 1 08/18/2022   lisinopril (ZESTRIL) 2.5 MG tablet Take 2.5 mg by mouth daily.   08/18/2022   Multiple  Vitamin (MULTIVITAMIN WITH MINERALS) TABS tablet Take 1 tablet by mouth daily. 90 tablet 1 08/18/2022   NOVOLOG 100 UNIT/ML injection Sliding Scale: Glucose 70 - 120: 0 units  Glucose 121 - 150: 1 unit  Glucose 151 - 200: 2 units Glucose 201 - 250: 3 units  Glucose 251 - 300: 5 units  Glucose 301 - 350: 7 units  Glucose 351 - 400: 9 units  Glucose > 400: call your docotr 10 mL 1 08/18/2022   ondansetron (ZOFRAN) 8 MG tablet Take 8 mg by mouth daily as needed for nausea or vomiting.   unk at unk   pantoprazole (PROTONIX) 40 MG tablet Take 1 tablet (40 mg total) by mouth 2 (two) times daily. Take twice daily for 2 months then decrease to once daily   08/18/2022   Social History   Socioeconomic History   Marital status: Single    Spouse name: Not on file   Number of children: 3   Years of education: Not on file   Highest education level: High school graduate  Occupational History    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  Tobacco Use   Smoking status: Never   Smokeless tobacco: Never  Vaping Use   Vaping Use: Never used  Substance and Sexual Activity   Alcohol use: Not Currently    Alcohol/week: 0.0 - 1.0 standard drinks of alcohol    Comment: "once every 2 months"   Drug use: Yes    Types: Marijuana, "Crack" cocaine    Comment: last week   Sexual activity: Not Currently    Birth control/protection: None  Other Topics Concern   Not on file  Social History Narrative   Lives with girlfriend "sharon"   Social Determinants of Health   Financial Resource Strain: Low Risk  (01/01/2020)   Overall Financial Resource Strain (CARDIA)    Difficulty of Paying Living Expenses: Not hard at all  Food Insecurity: No Food Insecurity (08/19/2022)   Hunger Vital Sign    Worried About Running Out of Food in the Last Year: Never true    Ran Out of Food in the Last Year: Never true  Transportation Needs: No Transportation Needs (08/19/2022)   PRAPARE - Administrator, Civil Service  (Medical): No    Lack of Transportation (Non-Medical): No  Physical Activity: Insufficiently Active (01/01/2020)   Exercise Vital Sign    Days of Exercise per Week: 7 days    Minutes of Exercise per Session: 20 min  Stress: No Stress Concern Present (01/01/2020)   [employer] of Occupational Health - Occupational Stress Questionnaire    Feeling of Stress : Not at all  Social Connections: Moderately Isolated (01/01/2020)   Social Connection and Isolation Panel [NHANES]    Frequency of Communication with Friends and Family: More than three times a week    Frequency of Social Gatherings with Friends and Family: More than three times a  week    Attends Religious Services: Never    Active Member of Clubs or Organizations: No    Attends Banker Meetings: Never    Marital Status: Living with partner  Intimate Partner Violence: Not At Risk (08/19/2022)   Humiliation, Afraid, Rape, and Kick questionnaire    Fear of Current or Ex-Partner: No    Emotionally Abused: No    Physically Abused: No    Sexually Abused: No    Family History  Problem Relation Age of Onset   Heart attack Father    Hypertension Sister    Cancer Sister       Review of systems complete and found to be negative unless listed above      PHYSICAL EXAM  General: Well developed, well nourished, in no acute distress HEENT:  Normocephalic and atramatic Neck:  No JVD.  Lungs: Clear bilaterally to auscultation and percussion. Heart: HRRR . Normal S1 and S2 without gallops or murmurs.  Abdomen: Bowel sounds are positive, abdomen soft and non-tender  Msk:  Back normal, normal gait. Normal strength and tone for age. Extremities: No clubbing, cyanosis or edema.   Neuro: Alert and oriented X 3. Psych:  Good affect, responds appropriately  Labs:   Lab Results  Component Value Date   WBC 2.4 (L) 08/22/2022   HGB 8.5 (L) 08/22/2022   HCT 25.9 (L) 08/22/2022   MCV 93.5 08/22/2022   PLT 153 08/22/2022     Recent Labs  Lab 08/18/22 1622 08/19/22 0004 08/23/22 0515  NA 133*   < > 135  K 3.6   < > 3.5  CL 99   < > 107  CO2 22   < > 24  BUN 43*   < > 19  CREATININE 2.04*   < > 1.18  CALCIUM 9.0   < > 8.0*  PROT 9.0*  --   --   BILITOT 0.8  --   --   ALKPHOS 54  --   --   ALT 23  --   --   AST 23  --   --   GLUCOSE 150*   < > 40*   < > = values in this interval not displayed.   Lab Results  Component Value Date   CKTOTAL 36 (L) 05/18/2022   TROPONINI 0.04 08/02/2018    Lab Results  Component Value Date   CHOL 128 08/10/2021   CHOL 118 03/15/2020   CHOL 138 12/19/2018   Lab Results  Component Value Date   HDL 38 (L) 08/10/2021   HDL 49 03/15/2020   HDL 46 12/19/2018   Lab Results  Component Value Date   LDLCALC 60 08/10/2021   LDLCALC 53 03/15/2020   LDLCALC 78 12/19/2018   Lab Results  Component Value Date   TRIG 152 (H) 08/10/2021   TRIG 80 03/15/2020   TRIG 70 12/19/2018   Lab Results  Component Value Date   CHOLHDL 3.4 08/10/2021   CHOLHDL 2.4 03/15/2020   CHOLHDL 3.0 12/19/2018   No results found for: "LDLDIRECT"    Radiology: ECHOCARDIOGRAM COMPLETE  Result Date: 08/19/2022    ECHOCARDIOGRAM REPORT   Patient Name:   Nicholas Weiss Date of Exam: 08/19/2022 Medical Rec #:  [id number]       Height:       71.0 in Accession #:    [id number]      Weight:       155.0 lb Date of Birth:  July 26, 1950  BSA:          1.892 m Patient Age:    71 years        BP:           145/93 mmHg Patient Gender: M               HR:           78 bpm. Exam Location:  [facility] Procedure: 2D Echo, Cardiac Doppler and Color Doppler Indications:     Syncope  History:         Patient has prior history of Echocardiogram examinations, most                  recent 04/24/2022. Stroke, Signs/Symptoms:Syncope,                  Dizziness/Lightheadedness and Bacteremia; Risk                  Factors:Hypertension, Diabetes and Dyslipidemia. CKD, Cocaine                  abuse.  Sonographer:      Mikki Harbor Referring Phys:  [id number] [facility] GOEL Diagnosing Phys: Yvonne Kendall MD IMPRESSIONS  1. Left ventricular ejection fraction, by estimation, is 60 to 65%. The left ventricle has normal function. The left ventricle has no regional wall motion abnormalities. There is moderate asymmetric left ventricular hypertrophy of the basal-septal segment. Left ventricular diastolic parameters were normal.  2. Right ventricular systolic function is normal. The right ventricular size is normal. There is normal pulmonary artery systolic pressure.  3. Left atrial size was mildly dilated.  4. A small pericardial effusion is present. The pericardial effusion is anterior to the right ventricle. There is no evidence of cardiac tamponade.  5. The mitral valve is degenerative. Trivial mitral valve regurgitation. No evidence of mitral stenosis.  6. The aortic valve is tricuspid. There is mild thickening of the aortic valve. Aortic valve regurgitation is not visualized. Aortic valve sclerosis is present, with no evidence of aortic valve stenosis.  7. The inferior vena cava is normal in size with greater than 50% respiratory variability, suggesting right atrial pressure of 3 mmHg. FINDINGS  Left Ventricle: Left ventricular ejection fraction, by estimation, is 60 to 65%. The left ventricle has normal function. The left ventricle has no regional wall motion abnormalities. The left ventricular internal cavity size was normal in size. There is  moderate asymmetric left ventricular hypertrophy of the basal-septal segment. Left ventricular diastolic parameters were normal. Right Ventricle: The right ventricular size is normal. No increase in right ventricular wall thickness. Right ventricular systolic function is normal. There is normal pulmonary artery systolic pressure. The tricuspid regurgitant velocity is 2.15 m/s, and  with an assumed right atrial pressure of 3 mmHg, the estimated right ventricular systolic pressure is 21.5  mmHg. Left Atrium: Left atrial size was mildly dilated. Right Atrium: Right atrial size was normal in size. Pericardium: A small pericardial effusion is present. The pericardial effusion is anterior to the right ventricle. There is no evidence of cardiac tamponade. Mitral Valve: The mitral valve is degenerative in appearance. There is mild thickening of the mitral valve leaflet(s). Trivial mitral valve regurgitation. No evidence of mitral valve stenosis. MV peak gradient, 6.9 mmHg. The mean mitral valve gradient is  2.0 mmHg. Tricuspid Valve: The tricuspid valve is normal in structure. Tricuspid valve regurgitation is mild. Aortic Valve: The aortic valve is tricuspid. There is mild thickening of the  aortic valve. Aortic valve regurgitation is not visualized. Aortic valve sclerosis is present, with no evidence of aortic valve stenosis. Aortic valve mean gradient measures 2.0  mmHg. Aortic valve peak gradient measures 3.0 mmHg. Aortic valve area, by VTI measures 2.89 cm. Pulmonic Valve: The pulmonic valve was normal in structure. Pulmonic valve regurgitation is not visualized. No evidence of pulmonic stenosis. Aorta: The aortic root is normal in size and structure. Pulmonary Artery: The pulmonary artery is not well seen. Venous: The inferior vena cava is normal in size with greater than 50% respiratory variability, suggesting right atrial pressure of 3 mmHg. IAS/Shunts: No atrial level shunt detected by color flow Doppler.  LEFT VENTRICLE PLAX 2D LVIDd:         4.60 cm   Diastology LVIDs:         2.70 cm   LV e' medial:    14.90 cm/s LV PW:         1.00 cm   LV E/e' medial:  6.6 LV IVS:        1.45 cm   LV e' lateral:   24.50 cm/s LVOT diam:     2.10 cm   LV E/e' lateral: 4.0 LV SV:         57 LV SV Index:   30 LVOT Area:     3.46 cm  RIGHT VENTRICLE RV Basal diam:  3.45 cm RV Mid diam:    2.90 cm RV S prime:     10.60 cm/s TAPSE (M-mode): 2.2 cm LEFT ATRIUM             Index        RIGHT ATRIUM           Index LA  diam:        3.90 cm 2.06 cm/m   RA Area:     17.00 cm LA Vol (A2C):   92.9 ml 49.10 ml/m  RA Volume:   44.70 ml  23.62 ml/m LA Vol (A4C):   56.0 ml 29.60 ml/m LA Biplane Vol: 74.2 ml 39.22 ml/m  AORTIC VALVE                    PULMONIC VALVE AV Area (Vmax):    3.09 cm     PV Vmax:       0.80 m/s AV Area (Vmean):   2.83 cm     PV Peak grad:  2.6 mmHg AV Area (VTI):     2.89 cm AV Vmax:           86.70 cm/s AV Vmean:          59.800 cm/s AV VTI:            0.199 m AV Peak Grad:      3.0 mmHg AV Mean Grad:      2.0 mmHg LVOT Vmax:         77.40 cm/s LVOT Vmean:        48.800 cm/s LVOT VTI:          0.166 m LVOT/AV VTI ratio: 0.83  AORTA Ao Root diam: 3.60 cm MITRAL VALVE               TRICUSPID VALVE MV Area (PHT): 5.79 cm    TR Peak grad:   18.5 mmHg MV Area VTI:   2.48 cm    TR Vmax:        215.00 cm/s MV Peak grad:  6.9 mmHg MV Mean grad:  2.0 mmHg    SHUNTS MV Vmax:       1.31 m/s    Systemic VTI:  0.17 m MV Vmean:      64.7 cm/s   Systemic Diam: 2.10 cm MV Decel Time: 131 msec MV E velocity: 98.50 cm/s MV A velocity: 50.80 cm/s MV E/A ratio:  1.94 Yvonne Kendall MD Electronically signed by Yvonne Kendall MD Signature Date/Time: 08/19/2022/5:42:01 PM    Final    DG Chest 2 View  Result Date: 08/18/2022 CLINICAL DATA:  Dizziness and syncope. EXAM: CHEST - 2 VIEW COMPARISON:  Chest radiograph dated 07/09/2022. FINDINGS: No focal consolidation, pleural effusion, or pneumothorax. The cardiac silhouette is within normal limits. Atherosclerotic calcification of the aorta. No acute osseous pathology. IMPRESSION: No active cardiopulmonary disease. Electronically Signed   By: Elgie Collard M.D.   On: 08/18/2022 23:44    EKG: Normal sinus rhythm extreme first-degree AV block nonspecific ST-T wave changes  ASSESSMENT AND PLAN:  Postural vertigo dizziness Near syncope Orthostatic hypotension Dehydration Polysubstance abuse History of hypertension Multiple myeloma Acute on chronic renal  insufficiency Diabetes . Plan Continue midodrine will increase dose to 10 mg 2 times a day to help with blood pressure support management Recommend hydration for dehydration Support stockings elevation to help with postural hypotension Counseled patient to advised to refrain from substance abuse Follow-up acute on chronic renal insufficiency with adequate hydration correct electrolytes Continue diabetes management and control Continue conservative management  Signed: Alwyn Pea MD 08/23/2022, 10:35 PM

## 2022-08-23 NOTE — Progress Notes (Signed)
Progress Note   Patient: Nicholas Weiss ZOX:[id number] DOB: [birth year] DOA: 08/18/2022     4 DOS: the patient was seen and examined on 08/23/2022     Subjective:  Patient seen and examined at bedside this morning Denies nausea vomiting chest pain cough He denies any dizziness upon standing up   Brief hospital course: From HPI "Nicholas Weiss is a 72 y.o. male with medical history significant of diabetes mellitus on insulin at home reportedly has chronic fatigue.  As per history obtained from patient's partner by the name of Nicholas Weiss over the phone, patient often gets to the dining table and then puts his head down on the dining table and rests, he often lays down on the left seat in the living space, for prolonged periods of time.  In spite of this patient does able to get to the mailbox outside which is about 100 feet away and back.  Also this causes him shortness of breath.  Patient was in his usual state of health earlier today as well.  There has been no fever nausea vomiting diarrhea or any new aches or pains.  Patient continued to have marked fatigue, laying around the house.    At approximately 3 PM, patient had gone to a local hardware store along with his partner Nicholas Weiss to get some supplies.  As the patient got out of the car he was noted to "almost pass out ".  Family member grabbed the patient and prevented the patient from falling down on the ground.  Subsequently patient regained comfort composure and was brought to [facility] ER.  Family member reports that the patient was also diaphoretic at the time.   The entirety of the above history was obtained from patient's partner by the name of Nicholas Weiss.  Over the phone.  Patient denied every complaint to me.  Including he could not tell me why the patient was brought to the hospital at all.  He denied any loss of consciousness any chest pains any trouble breathing.  In spite of patient's denial patient was noted to be fully oriented to location,  year, month, date.  "   Assessment and Plan:   Postural dizziness with presyncope likely in the setting of drug use As well as orthostatic hypotension due to dehydration He was significantly orthostatic on 08/20/2022 with blood pressure laying down of 126/80 sitting 113/72 and standing 83/57 and after 3 minutes of standing blood pressure dropped to 67/53. Orthostatic vitals today showed drop of 37 mmHg in systolic blood pressure from lying to standing We will continue monitoring orthostatic blood pressure Midodrine frequency have been increased to 5MG  3 times daily Cardiologist consulted given persistent orthostatic hypotension despite medication adjustment Amlodipine have been discontinued Echo showed EF 60 to 65% Repeat EKG showed first-degree AV block Avoiding beta-blockers  Continue IV fluid resuscitation   Polysubstance use disorder Patient uses cocaine and marijuana Counseled on cessation   Essential hypertension Amlodipine discontinued   History of multiple myeloma Outpatient follow-up with oncologist   AKI (acute kidney injury) (HCC) on CKD stage IIIa Continue current IV fluid Monitor renal function closely    Diabetes mellitus with hyperglycemia Hypoglycemia resolved Continue hypoglycemia protocol I have decreased the dose of Lantus Monitor glucose closely   Advance Care Planning:   Code Status: Full Code      Family Communication: No family at bedside for discussion   Physical Exam: General: Well-built gentleman that did not appear to be in any distress CNS: Alert and  oriented x 3 Respiratory exam: Bilateral air entry vesicular Cardiovascular exam S1 is normal Abdomen soft nontender Extremities warm without edema     Data Reviewed: Reviewed patient's laboratory results showing glucose was low at 40 this morning   Time spent: 41 minutes      Vitals:   08/23/22 0131 08/23/22 0543 08/23/22 1108 08/23/22 1327  BP: 133/86 124/67  139/81  Pulse: 73 71   81  Resp: 16 16    Temp: 99 F (37.2 C) (!) 97.4 F (36.3 C)  98.2 F (36.8 C)  TempSrc:    Oral  SpO2: 100% 100%  100%  Weight:      Height:   5\' 11"  (1.803 m)     Author: Loyce Dys, MD 08/23/2022 3:34 PM  For on call review [email or web address].

## 2022-08-23 NOTE — Progress Notes (Signed)
Physical Therapy Treatment Patient Details Name: Nicholas Weiss MRN: [id number] DOB: [birth year] Today's Date: 08/23/2022   History of Present Illness presented to ER and admitted for workup related to syncopal episode    PT Comments    Pt sitting EOB with tech upon arrival.  Orthostatic BP's taken from sitting position with no drop noted this session.  Documented in flow sheets.  He is able to walk x 2 laps on unit with no AD.  Some imbalances with gait but he does recover on his own. Stated he uses a cane on occasion at home.  He denies any dizziness.   Recommendations for follow up therapy are one component of a multi-disciplinary discharge planning process, led by the attending physician.  Recommendations may be updated based on patient status, additional functional criteria and insurance authorization.  Follow Up Recommendations       Assistance Recommended at Discharge PRN  Patient can return home with the following A little help with walking and/or transfers;Help with stairs or ramp for entrance   Equipment Recommendations       Recommendations for Other Services       Precautions / Restrictions Precautions Precautions: Fall Restrictions Weight Bearing Restrictions: No     Mobility  Bed Mobility Overal bed mobility: Modified Independent                  Transfers Overall transfer level: Needs assistance   Transfers: Sit to/from Stand Sit to Stand: Modified independent (Device/Increase time)                Ambulation/Gait Ambulation/Gait assistance: Supervision Gait Distance (Feet): 400 Feet Assistive device: None Gait Pattern/deviations: Step-through pattern, Decreased step length - right, Decreased step length - left, Staggering right, Staggering left, Wide base of support Gait velocity: decreased     General Gait Details: occasional imbalances but recovers Scientist, research (life sciences)    Modified  Rankin (Stroke Patients Only)       Balance Overall balance assessment: Needs assistance Sitting-balance support: No upper extremity supported, Feet supported Sitting balance-Leahy Scale: Good     Standing balance support: No upper extremity supported Standing balance-Leahy Scale: Fair                              Cognition   Behavior During Therapy: WFL for tasks assessed/performed Overall Cognitive Status: Within Functional Limits for tasks assessed                                          Exercises      General Comments        Pertinent Vitals/Pain Pain Assessment Pain Assessment: No/denies pain    Home Living                          Prior Function            PT Goals (current goals can now be found in the care plan section) Progress towards PT goals: Progressing toward goals    Frequency    Min 2X/week      PT Plan Current plan remains appropriate    Co-evaluation              AM-PAC  PT "6 Clicks" Mobility   Outcome Measure  Help needed turning from your back to your side while in a flat bed without using bedrails?: None Help needed moving from lying on your back to sitting on the side of a flat bed without using bedrails?: None Help needed moving to and from a bed to a chair (including a wheelchair)?: None Help needed standing up from a chair using your arms (e.g., wheelchair or bedside chair)?: None Help needed to walk in hospital room?: A Little Help needed climbing 3-5 steps with a railing? : A Little 6 Click Score: 22    End of Session Equipment Utilized During Treatment: Gait belt Activity Tolerance: Patient tolerated treatment well Patient left: in bed;with call bell/phone within reach;with bed alarm set Nurse Communication: Mobility status PT Visit Diagnosis: Difficulty in walking, not elsewhere classified (R26.2);History of falling (Z91.81)     Time: 9629-5284 PT Time Calculation  (min) (ACUTE ONLY): 12 min  Charges:  $Gait Training: 8-22 mins                   Danielle Dess, PTA 08/23/22, 12:51 PM

## 2022-08-24 DIAGNOSIS — R55 Syncope and collapse: Secondary | ICD-10-CM | POA: Diagnosis not present

## 2022-08-24 LAB — CBC WITH DIFFERENTIAL/PLATELET
Abs Immature Granulocytes: 0 10*3/uL (ref 0.00–0.07)
Basophils Absolute: 0 10*3/uL (ref 0.0–0.1)
Basophils Relative: 0 %
Eosinophils Absolute: 0 10*3/uL (ref 0.0–0.5)
Eosinophils Relative: 2 %
HCT: 23.2 % — ABNORMAL LOW (ref 39.0–52.0)
Hemoglobin: 7.8 g/dL — ABNORMAL LOW (ref 13.0–17.0)
Immature Granulocytes: 0 %
Lymphocytes Relative: 36 %
Lymphs Abs: 0.9 10*3/uL (ref 0.7–4.0)
MCH: 30.5 pg (ref 26.0–34.0)
MCHC: 33.6 g/dL (ref 30.0–36.0)
MCV: 90.6 fL (ref 80.0–100.0)
Monocytes Absolute: 0.3 10*3/uL (ref 0.1–1.0)
Monocytes Relative: 11 %
Neutro Abs: 1.2 10*3/uL — ABNORMAL LOW (ref 1.7–7.7)
Neutrophils Relative %: 51 %
Platelets: 171 10*3/uL (ref 150–400)
RBC: 2.56 MIL/uL — ABNORMAL LOW (ref 4.22–5.81)
RDW: 15.1 % (ref 11.5–15.5)
WBC: 2.4 10*3/uL — ABNORMAL LOW (ref 4.0–10.5)
nRBC: 0 % (ref 0.0–0.2)

## 2022-08-24 LAB — GLUCOSE, CAPILLARY
Glucose-Capillary: 128 mg/dL — ABNORMAL HIGH (ref 70–99)
Glucose-Capillary: 115 mg/dL — ABNORMAL HIGH (ref 70–99)
Glucose-Capillary: 219 mg/dL — ABNORMAL HIGH (ref 70–99)
Glucose-Capillary: 208 mg/dL — ABNORMAL HIGH (ref 70–99)
Glucose-Capillary: 204 mg/dL — ABNORMAL HIGH (ref 70–99)

## 2022-08-24 LAB — BASIC METABOLIC PANEL
Anion gap: 6 (ref 5–15)
BUN: 17 mg/dL (ref 8–23)
CO2: 22 mmol/L (ref 22–32)
Calcium: 7.8 mg/dL — ABNORMAL LOW (ref 8.9–10.3)
Chloride: 105 mmol/L (ref 98–111)
Creatinine, Ser: 1.32 mg/dL — ABNORMAL HIGH (ref 0.61–1.24)
GFR, Estimated: 58 mL/min — ABNORMAL LOW (ref >60)
Glucose, Bld: 129 mg/dL — ABNORMAL HIGH (ref 70–99)
Potassium: 3.8 mmol/L (ref 3.5–5.1)
Sodium: 133 mmol/L — ABNORMAL LOW (ref 135–145)

## 2022-08-24 LAB — CULTURE, BLOOD (ROUTINE X 2)

## 2022-08-24 MED ORDER — MIDODRINE HCL 5 MG PO TABS
10.0000 mg | ORAL_TABLET | Freq: Three times a day (TID) | ORAL | Status: DC
  Administered 2022-08-24 – 2022-08-25 (×3): 10 mg via ORAL
  Filled 2022-08-24 (×3): qty 2

## 2022-08-24 NOTE — Care Management Important Message (Signed)
Important Message  Patient Details  Name: Nicholas Weiss MRN: [id number] Date of Birth: August 30, 1950   Medicare Important Message Given:  Yes  Patient asleep upon time of visit, no family in room.  Copy of Medicare IM left in room for reference.    Johnell Comings 08/24/2022, 10:42 AM

## 2022-08-24 NOTE — Progress Notes (Signed)
[facility] Cardiology    SUBJECTIVE: Resting comfortably in bed and complains of dizziness when he sits up with drop in blood pressure no chest pain no palpitation tachycardia   Vitals:   08/23/22 2103 08/24/22 0013 08/24/22 0445 08/24/22 0739  BP: (!) 146/83 129/80 (!) 141/79 (!) 145/84  Pulse: 72 75 69 70  Resp: 18 16 16 18   Temp: 98.3 F (36.8 C) 98.9 F (37.2 C) 98.3 F (36.8 C) 98 F (36.7 C)  TempSrc:      SpO2: 100% 100% 100% 100%  Weight:      Height:         Intake/Output Summary (Last 24 hours) at 08/24/2022 1004 Last data filed at 08/24/2022 1610 Gross per 24 hour  Intake 2211.52 ml  Output 4250 ml  Net -2038.48 ml      PHYSICAL EXAM  General: Well developed, well nourished, in no acute distress HEENT:  Normocephalic and atramatic Neck:  No JVD.  Lungs: Clear bilaterally to auscultation and percussion. Heart: HRRR . Normal S1 and S2 without gallops or murmurs.  Abdomen: Bowel sounds are positive, abdomen soft and non-tender  Msk:  Back normal, normal gait. Normal strength and tone for age. Extremities: No clubbing, cyanosis or edema.   Neuro: Alert and oriented X 3. Psych:  Good affect, responds appropriately   LABS: Basic Metabolic Panel: Recent Labs    08/23/22 0515 08/24/22 0451  NA 135 133*  K 3.5 3.8  CL 107 105  CO2 24 22  GLUCOSE 40* 129*  BUN 19 17  CREATININE 1.18 1.32*  CALCIUM 8.0* 7.8*   Liver Function Tests: No results for input(s): "AST", "ALT", "ALKPHOS", "BILITOT", "PROT", "ALBUMIN" in the last 72 hours. No results for input(s): "LIPASE", "AMYLASE" in the last 72 hours. CBC: Recent Labs    08/22/22 0509 08/24/22 0451  WBC 2.4* 2.4*  NEUTROABS 1.8 1.2*  HGB 8.5* 7.8*  HCT 25.9* 23.2*  MCV 93.5 90.6  PLT 153 171   Cardiac Enzymes: No results for input(s): "CKTOTAL", "CKMB", "CKMBINDEX", "TROPONINI" in the last 72 hours. BNP: Invalid input(s): "POCBNP" D-Dimer: No results for input(s): "DDIMER" in the last 72  hours. Hemoglobin A1C: No results for input(s): "HGBA1C" in the last 72 hours. Fasting Lipid Panel: No results for input(s): "CHOL", "HDL", "LDLCALC", "TRIG", "CHOLHDL", "LDLDIRECT" in the last 72 hours. Thyroid Function Tests: No results for input(s): "TSH", "T4TOTAL", "T3FREE", "THYROIDAB" in the last 72 hours.  Invalid input(s): "FREET3" Anemia Panel: No results for input(s): "VITAMINB12", "FOLATE", "FERRITIN", "TIBC", "IRON", "RETICCTPCT" in the last 72 hours.  No results found.   Echo of left ventricular function EF of 60% past  TELEMETRY: Normal sinus rhythm rate of 70 nonspecific ST-T changes:  ASSESSMENT AND PLAN:  Principal Problem:   Near syncope Active Problems:   AKI (acute kidney injury) (HCC)   Postural dizziness with presyncope Diabetes Polysubstance abuse Hypertension Multiple myeloma Acute /chronic renal insufficiency  Plan Patient resting comfortably in bed denies any complaints Consider advancing midodrine to 10 mg 3 times a day to help with orthostasis Recommend adequate hydration support stockings Continue diabetes management and control Follow-up renal insufficiency with adequate hydration   Alwyn Pea, MD 08/24/2022 10:04 AM

## 2022-08-24 NOTE — Progress Notes (Signed)
Progress Note   Patient: Nicholas Weiss ZOX:[id number] DOB: 1950/06/24 DOA: 08/18/2022     5 DOS: the patient was seen and examined on 08/24/2022   Subjective:  Patient seen and examined at bedside this morning Denies nausea vomiting chest pain cough Prostatic hypertension improving He denies any dizziness upon standing up   Brief hospital course: From HPI "Nicholas Weiss is a 72 y.o. male with medical history significant of diabetes mellitus on insulin at home reportedly has chronic fatigue.  As per history obtained from patient's partner by the name of Nicholas Weiss over the phone, patient often gets to the dining table and then puts his head down on the dining table and rests, he often lays down on the left seat in the living space, for prolonged periods of time.  In spite of this patient does able to get to the mailbox outside which is about 100 feet away and back.  Also this causes him shortness of breath.  Patient was in his usual state of health earlier today as well.  There has been no fever nausea vomiting diarrhea or any new aches or pains.  Patient continued to have marked fatigue, laying around the house.    At approximately 3 PM, patient had gone to a local hardware store along with his partner Nicholas Weiss to get some supplies.  As the patient got out of the car he was noted to "almost pass out ".  Family member grabbed the patient and prevented the patient from falling down on the ground.  Subsequently patient regained comfort composure and was brought to [facility] ER.  Family member reports that the patient was also diaphoretic at the time.   The entirety of the above history was obtained from patient's partner by the name of Nicholas Weiss.  Over the phone.  Patient denied every complaint to me.  Including he could not tell me why the patient was brought to the hospital at all.  He denied any loss of consciousness any chest pains any trouble breathing.  In spite of patient's denial patient was noted to be  fully oriented to location, year, month, date.  "   Assessment and Plan:   Postural dizziness with presyncope likely in the setting of drug use As well as orthostatic hypotension due to dehydration We will continue monitoring orthostatic blood pressure Cardiologist on board Midodrine dose have been adjusted By cardiologist Amlodipine have been discontinued Echo showed EF 60 to 65% Repeat EKG showed first-degree AV block Avoiding beta-blockers  Continue IV fluid resuscitation   Polysubstance use disorder Patient uses cocaine and marijuana Counseled on cessation   Essential hypertension Amlodipine discontinued   History of multiple myeloma Outpatient follow-up with oncologist   AKI (acute kidney injury) (HCC) on CKD stage IIIa Continue current IV fluid Monitor renal function closely     Diabetes mellitus with hyperglycemia Hypoglycemia resolved Continue hypoglycemia protocol I have decreased the dose of Lantus Monitor glucose closely   Advance Care Planning:   Code Status: Full Code      Family Communication: No family at bedside for discussion   Physical Exam: General: Well-built gentleman that did not appear to be in any distress CNS: Alert and oriented x 3 Respiratory exam: Bilateral air entry vesicular Cardiovascular exam S1 is normal Abdomen soft nontender Extremities warm without edema     Data Reviewed: Reviewed patient's laboratory results showing sodium 133 potassium 3.8 glucose 129 creatinine 1.3 WBC 2.4 hemoglobin 7.8   Time spent: 40 minutes  Vitals:   08/24/22 0013 08/24/22 0445 08/24/22 0739 08/24/22 1100  BP: 129/80 (!) 141/79 (!) 145/84 118/76  Pulse: 75 69 70 77  Resp: 16 16 18 17   Temp: 98.9 F (37.2 C) 98.3 F (36.8 C) 98 F (36.7 C) 97.9 F (36.6 C)  TempSrc:      SpO2: 100% 100% 100%   Weight:      Height:         Author: Loyce Dys, MD 08/24/2022 3:56 PM  For on call review [email or web address].

## 2022-08-24 NOTE — TOC Progression Note (Signed)
Transition of Care [facility]) - Progression Note    Patient Details  Name: Nicholas Weiss MRN: [id number] Date of Birth: 06/08/1950  [facility] CM/SW Contact  Allena Katz, LCSW Phone Number: 08/24/2022, 10:57 AM  Clinical Narrative:    CSW spoke with pt to complete HRA. Pt reports he lives at home with his girlfriend and his son. Pt reports he drives to his doctors appointments and is active with his PCP and he recently saw him about a month ago. Pt reports he had HH in the past and would like to use the agency he had. TOC to see if agency is in patient ping. Pt reports that he is not using any substances and is only taking what is prescribed. Pt has a RW and cane at home and reports he uses it on occasion.         Expected Discharge Plan and Services                                               Social Determinants of Health (SDOH) Interventions SDOH Screenings   Food Insecurity: No Food Insecurity (08/19/2022)  Housing: Low Risk  (08/19/2022)  Transportation Needs: No Transportation Needs (08/19/2022)  Utilities: Not At Risk (08/19/2022)  Alcohol Screen: Low Risk  (10/02/2020)  Depression (PHQ2-9): Medium Risk (10/02/2020)  Financial Resource Strain: Low Risk  (01/01/2020)  Physical Activity: Insufficiently Active (01/01/2020)  Social Connections: Moderately Isolated (01/01/2020)  Stress: No Stress Concern Present (01/01/2020)  Tobacco Use: Low Risk  (08/18/2022)    Readmission Risk Interventions    08/24/2022   10:57 AM 05/20/2022   11:41 AM 03/12/2022   10:22 AM  Readmission Risk Prevention Plan  Transportation Screening Complete Complete Complete  Medication Review Oceanographer) Complete Complete Complete  PCP or Specialist appointment within 3-5 days of discharge Complete Complete Complete  HRI or Home Care Consult Complete Complete Complete  SW Recovery Care/Counseling Consult   Complete  Palliative Care Screening  Complete Not Applicable   Skilled Nursing Facility Complete Complete Not Applicable

## 2022-08-25 DIAGNOSIS — R55 Syncope and collapse: Secondary | ICD-10-CM | POA: Diagnosis not present

## 2022-08-25 LAB — CBC WITH DIFFERENTIAL/PLATELET
Abs Immature Granulocytes: 0.01 10*3/uL (ref 0.00–0.07)
Basophils Absolute: 0 10*3/uL (ref 0.0–0.1)
Basophils Relative: 0 %
Eosinophils Absolute: 0.1 10*3/uL (ref 0.0–0.5)
Eosinophils Relative: 2 %
HCT: 24.9 % — ABNORMAL LOW (ref 39.0–52.0)
Hemoglobin: 8.2 g/dL — ABNORMAL LOW (ref 13.0–17.0)
Immature Granulocytes: 0 %
Lymphocytes Relative: 40 %
Lymphs Abs: 1.4 10*3/uL (ref 0.7–4.0)
MCH: 30.3 pg (ref 26.0–34.0)
MCHC: 32.9 g/dL (ref 30.0–36.0)
MCV: 91.9 fL (ref 80.0–100.0)
Monocytes Absolute: 0.5 10*3/uL (ref 0.1–1.0)
Monocytes Relative: 13 %
Neutro Abs: 1.6 10*3/uL — ABNORMAL LOW (ref 1.7–7.7)
Neutrophils Relative %: 45 %
Platelets: 192 10*3/uL (ref 150–400)
RBC: 2.71 MIL/uL — ABNORMAL LOW (ref 4.22–5.81)
RDW: 15 % (ref 11.5–15.5)
WBC: 3.6 10*3/uL — ABNORMAL LOW (ref 4.0–10.5)
nRBC: 0 % (ref 0.0–0.2)

## 2022-08-25 LAB — BASIC METABOLIC PANEL
Anion gap: 8 (ref 5–15)
BUN: 20 mg/dL (ref 8–23)
CO2: 21 mmol/L — ABNORMAL LOW (ref 22–32)
Calcium: 8 mg/dL — ABNORMAL LOW (ref 8.9–10.3)
Chloride: 106 mmol/L (ref 98–111)
Creatinine, Ser: 1.14 mg/dL (ref 0.61–1.24)
GFR, Estimated: >60 mL/min (ref >60)
Glucose, Bld: 43 mg/dL — CL (ref 70–99)
Potassium: 3.5 mmol/L (ref 3.5–5.1)
Sodium: 135 mmol/L (ref 135–145)

## 2022-08-25 LAB — GLUCOSE, CAPILLARY
Glucose-Capillary: 51 mg/dL — ABNORMAL LOW (ref 70–99)
Glucose-Capillary: 62 mg/dL — ABNORMAL LOW (ref 70–99)
Glucose-Capillary: 81 mg/dL (ref 70–99)
Glucose-Capillary: 150 mg/dL — ABNORMAL HIGH (ref 70–99)
Glucose-Capillary: 171 mg/dL — ABNORMAL HIGH (ref 70–99)
Glucose-Capillary: 254 mg/dL — ABNORMAL HIGH (ref 70–99)
Glucose-Capillary: 242 mg/dL — ABNORMAL HIGH (ref 70–99)

## 2022-08-25 MED ORDER — MIDODRINE HCL 5 MG PO TABS: 5.0000 mg | ORAL_TABLET | Freq: Three times a day (TID) | ORAL | Status: DC

## 2022-08-25 MED ORDER — ENOXAPARIN SODIUM 40 MG/0.4ML IJ SOSY
40.0000 mg | PREFILLED_SYRINGE | INTRAMUSCULAR | Status: DC
  Administered 2022-08-25: 40 mg via SUBCUTANEOUS
  Filled 2022-08-25: qty 0.4

## 2022-08-25 NOTE — Progress Notes (Signed)
Progress Note   Patient: Nicholas Weiss UVO:[id number] DOB: 1950/12/04 DOA: 08/18/2022     6 DOS: the patient was seen and examined on 08/25/2022    Subjective:  Patient seen and examined at bedside this morning Denies nausea vomiting chest pain cough Orthostatic hypotension improving    Brief hospital course: From HPI "Nicholas Weiss is a 72 y.o. male with medical history significant of diabetes mellitus on insulin at home reportedly has chronic fatigue.  As per history obtained from patient's partner by the name of Nicholas Weiss over the phone, patient often gets to the dining table and then puts his head down on the dining table and rests, he often lays down on the left seat in the living space, for prolonged periods of time.  In spite of this patient does able to get to the mailbox outside which is about 100 feet away and back.  Also this causes him shortness of breath.  Patient was in his usual state of health earlier today as well.  There has been no fever nausea vomiting diarrhea or any new aches or pains.  Patient continued to have marked fatigue, laying around the house.    At approximately 3 PM, patient had gone to a local hardware store along with his partner Nicholas Weiss to get some supplies.  As the patient got out of the car he was noted to "almost pass out ".  Family member grabbed the patient and prevented the patient from falling down on the ground.  Subsequently patient regained comfort composure and was brought to [facility] ER.  Family member reports that the patient was also diaphoretic at the time.   The entirety of the above history was obtained from patient's partner by the name of Nicholas Weiss.  Over the phone.  Patient denied every complaint to me.  Including he could not tell me why the patient was brought to the hospital at all.  He denied any loss of consciousness any chest pains any trouble breathing.  In spite of patient's denial patient was noted to be fully oriented to location, year,  month, date.  "   Assessment and Plan:   Postural dizziness with presyncope likely in the setting of drug use As well as orthostatic hypotension due to dehydration We will continue monitoring orthostatic blood pressure Today patient's systolic blood pressure dropped from 150s to 116 upon standing even though this is slowly improving compared to on presentation Cardiologist on board Midodrine dose have been adjusted By cardiologist to 10 mg 3 times daily Amlodipine and hydralazine have been discontinued Echo showed EF 60 to 65% Repeat EKG showed first-degree AV block Avoiding beta-blockers and vasodilatory antihypertensive medications Continue IV fluid resuscitation to correct dehydration   Polysubstance use disorder Patient uses cocaine and marijuana Counseled on cessation   Essential hypertension Amlodipine and hydralazine discontinued   History of multiple myeloma Outpatient follow-up with oncologist   AKI (acute kidney injury) (HCC) on CKD stage IIIa Continue current IV fluid Monitor renal function closely     Diabetes mellitus with hyperglycemia Hypoglycemia resolved Continue hypoglycemia protocol I have decreased the dose of Lantus We will continue to monitor patient's intake Monitor glucose closely   Advance Care Planning:   Code Status: Full Code      Family Communication: No family at bedside for discussion   Physical Exam: General: Well-built gentleman that did not appear to be in any distress CNS: Alert and oriented x 3 Respiratory exam: Bilateral air entry vesicular Cardiovascular exam S1  is normal Abdomen soft nontender Extremities warm without edema     Data Reviewed: Reviewed patient's laboratory results showing sodium 135 potassium 3.5 glucose 43 creatinine 1.1 WBC 3.6 hemoglobin 8.2   time spent: 43 minutes      Vitals:   08/24/22 2022 08/24/22 2336 08/25/22 0514 08/25/22 0833  BP: 134/87 123/86 (!) 144/82 (!) 158/92  Pulse: 78 73 73    Resp: 19 18 18 17   Temp: 98.1 F (36.7 C) 98.6 F (37 C) 98.7 F (37.1 C) 98.8 F (37.1 C)  TempSrc:      SpO2: 100% 100% 100% 100%  Weight:      Height:         Author: Loyce Dys, MD 08/25/2022 11:30 AM  For on call review [email or web address].

## 2022-08-25 NOTE — Progress Notes (Signed)
Physical Therapy Treatment Patient Details Name: Nicholas Weiss MRN: [id number] DOB: [birth year] Today's Date: 08/25/2022   History of Present Illness presented to ER and admitted for workup related to syncopal episode    PT Comments    Pt ready for session.  Orthostatics being done by tech upon arrival.  Some drop but asymptomatic and does not limit activity.  He is able to complete x 3 laps with [facility] and supervision.  Balance is improved with SPC vs last session without AD.  He does have a SPC at home.  He is able to recover imbalances without intervention.  Pt hopes to discharge home today.   Recommendations for follow up therapy are one component of a multi-disciplinary discharge planning process, led by the attending physician.  Recommendations may be updated based on patient status, additional functional criteria and insurance authorization.  Follow Up Recommendations       Assistance Recommended at Discharge PRN  Patient can return home with the following Help with stairs or ramp for entrance   Equipment Recommendations       Recommendations for Other Services       Precautions / Restrictions Precautions Precautions: Fall Restrictions Weight Bearing Restrictions: No     Mobility  Bed Mobility Overal bed mobility: Modified Independent               Patient Response: Cooperative  Transfers Overall transfer level: Modified independent                      Ambulation/Gait Ambulation/Gait assistance: Supervision Gait Distance (Feet): 500 Feet Assistive device: Straight cane Gait Pattern/deviations: Step-through pattern, Staggering left, Staggering right Gait velocity: decreased     General Gait Details: less imbalances today with SPC and does self recover all imbalances   Stairs             Wheelchair Mobility    Modified Rankin (Stroke Patients Only)       Balance Overall balance assessment: Needs assistance Sitting-balance  support: Feet supported Sitting balance-Leahy Scale: Normal     Standing balance support: Single extremity supported Standing balance-Leahy Scale: Fair                              Cognition Arousal/Alertness: Awake/alert Behavior During Therapy: WFL for tasks assessed/performed Overall Cognitive Status: Within Functional Limits for tasks assessed                                          Exercises      General Comments        Pertinent Vitals/Pain Pain Assessment Pain Assessment: No/denies pain    Home Living                          Prior Function            PT Goals (current goals can now be found in the care plan section) Progress towards PT goals: Progressing toward goals    Frequency    Min 2X/week      PT Plan Current plan remains appropriate    Co-evaluation              AM-PAC PT "6 Clicks" Mobility   Outcome Measure  Help needed turning from your back to your side while  in a flat bed without using bedrails?: None Help needed moving from lying on your back to sitting on the side of a flat bed without using bedrails?: None Help needed moving to and from a bed to a chair (including a wheelchair)?: None Help needed standing up from a chair using your arms (e.g., wheelchair or bedside chair)?: None Help needed to walk in hospital room?: A Little Help needed climbing 3-5 steps with a railing? : A Little 6 Click Score: 22    End of Session Equipment Utilized During Treatment: Gait belt Activity Tolerance: Patient tolerated treatment well Patient left: in bed;with call bell/phone within reach;with bed alarm set Nurse Communication: Mobility status PT Visit Diagnosis: Difficulty in walking, not elsewhere classified (R26.2);History of falling (Z91.81)     Time: 4098-1191 PT Time Calculation (min) (ACUTE ONLY): 16 min  Charges:  $Gait Training: 8-22 mins                   Danielle Dess, PTA 08/25/22,  9:55 AM

## 2022-08-25 NOTE — Inpatient Diabetes Management (Signed)
Inpatient Diabetes Program Recommendations  AACE/ADA: New Consensus Statement on Inpatient Glycemic Control  Target Ranges:  Prepandial:   less than 140 mg/dL      Peak postprandial:   less than 180 mg/dL (1-2 hours)      Critically ill patients:  140 - 180 mg/dL    Latest Reference Range & Units 08/25/22 06:14 08/25/22 06:33 08/25/22 06:45  Glucose-Capillary 70 - 99 mg/dL 51 (L) 62 (L) 81    Latest Reference Range & Units 08/24/22 07:41 08/24/22 11:15 08/24/22 17:04 08/24/22 20:24  Glucose-Capillary 70 - 99 mg/dL 161 (H) 096 (H) 045 (H) 204 (H)   Review of Glycemic Control  Diabetes history: DM1 Outpatient Diabetes medications: Tresiba 13 units daily, Novolog 0-9 units TID with meals Current orders for Inpatient glycemic control: Semglee 10 units daily, Novolog 0-6 units TID with meals, Novolog 0-5 units QHS  Inpatient Diabetes Program Recommendations:    Insulin: Please consider decreasing Semglee to 7 units daily and ordering Novolog 2 units TID with meals for meal coverage if patient eats at least 50% of meals.  Thanks, Orlando Penner, RN, MSN, CDCES Diabetes Coordinator Inpatient Diabetes Program [phone] (Team Pager from 8am to 5pm)

## 2022-08-25 NOTE — TOC Progression Note (Signed)
Transition of Care [facility]) - Progression Note    Patient Details  Name: Nicholas Weiss MRN: [id number] Date of Birth: Feb 08, 1951  [facility] CM/SW Contact  Allena Katz, LCSW Phone Number: 08/25/2022, 11:21 AM  Clinical Narrative:    CSW spoke with [place] with [facility] who reports she can take pt back for [facility] PT/OT/RN.         Expected Discharge Plan and Services                                               Social Determinants of Health (SDOH) Interventions SDOH Screenings   Food Insecurity: No Food Insecurity (08/19/2022)  Housing: Low Risk  (08/19/2022)  Transportation Needs: No Transportation Needs (08/19/2022)  Utilities: Not At Risk (08/19/2022)  Alcohol Screen: Low Risk  (10/02/2020)  Depression (PHQ2-9): Medium Risk (10/02/2020)  Financial Resource Strain: Low Risk  (01/01/2020)  Physical Activity: Insufficiently Active (01/01/2020)  Social Connections: Moderately Isolated (01/01/2020)  Stress: No Stress Concern Present (01/01/2020)  Tobacco Use: Low Risk  (08/18/2022)    Readmission Risk Interventions    08/24/2022   10:57 AM 05/20/2022   11:41 AM 03/12/2022   10:22 AM  Readmission Risk Prevention Plan  Transportation Screening Complete Complete Complete  Medication Review Oceanographer) Complete Complete Complete  PCP or Specialist appointment within 3-5 days of discharge Complete Complete Complete  HRI or Home Care Consult Complete Complete Complete  SW Recovery Care/Counseling Consult   Complete  Palliative Care Screening  Complete Not Applicable  Skilled Nursing Facility Complete Complete Not Applicable

## 2022-08-26 DIAGNOSIS — R42 Dizziness and giddiness: Secondary | ICD-10-CM | POA: Diagnosis not present

## 2022-08-26 DIAGNOSIS — E1069 Type 1 diabetes mellitus with other specified complication: Secondary | ICD-10-CM | POA: Insufficient documentation

## 2022-08-26 DIAGNOSIS — E162 Hypoglycemia, unspecified: Secondary | ICD-10-CM | POA: Diagnosis not present

## 2022-08-26 DIAGNOSIS — R55 Syncope and collapse: Secondary | ICD-10-CM | POA: Diagnosis not present

## 2022-08-26 DIAGNOSIS — E1065 Type 1 diabetes mellitus with hyperglycemia: Secondary | ICD-10-CM | POA: Diagnosis not present

## 2022-08-26 LAB — CBC WITH DIFFERENTIAL/PLATELET
Abs Immature Granulocytes: 0 10*3/uL (ref 0.00–0.07)
Basophils Absolute: 0 10*3/uL (ref 0.0–0.1)
Basophils Relative: 0 %
Eosinophils Absolute: 0.1 10*3/uL (ref 0.0–0.5)
Eosinophils Relative: 2 %
HCT: 24.9 % — ABNORMAL LOW (ref 39.0–52.0)
Hemoglobin: 8.2 g/dL — ABNORMAL LOW (ref 13.0–17.0)
Immature Granulocytes: 0 %
Lymphocytes Relative: 39 %
Lymphs Abs: 1.1 10*3/uL (ref 0.7–4.0)
MCH: 30 pg (ref 26.0–34.0)
MCHC: 32.9 g/dL (ref 30.0–36.0)
MCV: 91.2 fL (ref 80.0–100.0)
Monocytes Absolute: 0.4 10*3/uL (ref 0.1–1.0)
Monocytes Relative: 14 %
Neutro Abs: 1.3 10*3/uL — ABNORMAL LOW (ref 1.7–7.7)
Neutrophils Relative %: 45 %
Platelets: 191 10*3/uL (ref 150–400)
RBC: 2.73 MIL/uL — ABNORMAL LOW (ref 4.22–5.81)
RDW: 15.2 % (ref 11.5–15.5)
WBC: 2.9 10*3/uL — ABNORMAL LOW (ref 4.0–10.5)
nRBC: 0 % (ref 0.0–0.2)

## 2022-08-26 LAB — BASIC METABOLIC PANEL
Anion gap: 5 (ref 5–15)
BUN: 20 mg/dL (ref 8–23)
CO2: 22 mmol/L (ref 22–32)
Calcium: 8.1 mg/dL — ABNORMAL LOW (ref 8.9–10.3)
Chloride: 106 mmol/L (ref 98–111)
Creatinine, Ser: 1.15 mg/dL (ref 0.61–1.24)
GFR, Estimated: >60 mL/min (ref >60)
Glucose, Bld: 195 mg/dL — ABNORMAL HIGH (ref 70–99)
Potassium: 4 mmol/L (ref 3.5–5.1)
Sodium: 133 mmol/L — ABNORMAL LOW (ref 135–145)

## 2022-08-26 LAB — GLUCOSE, CAPILLARY: Glucose-Capillary: 194 mg/dL — ABNORMAL HIGH (ref 70–99)

## 2022-08-26 MED ORDER — INSULIN ASPART 100 UNIT/ML IJ SOLN: 2.0000 [IU] | Freq: Three times a day (TID) | INTRAMUSCULAR | Status: DC

## 2022-08-26 MED ORDER — MIDODRINE HCL 5 MG PO TABS: 2.5000 mg | ORAL_TABLET | Freq: Two times a day (BID) | ORAL | Status: DC

## 2022-08-26 MED ORDER — TRESIBA FLEXTOUCH 100 UNIT/ML ~~LOC~~ SOPN
8.0000 [IU] | PEN_INJECTOR | Freq: Every day | SUBCUTANEOUS | Status: DC
Start: 2022-08-26 — End: 2022-11-15

## 2022-08-26 MED ORDER — INSULIN GLARGINE-YFGN 100 UNIT/ML ~~LOC~~ SOLN
8.0000 [IU] | Freq: Every day | SUBCUTANEOUS | Status: DC
  Administered 2022-08-26: 8 [IU] via SUBCUTANEOUS
  Filled 2022-08-26: qty 0.08

## 2022-08-26 NOTE — TOC Transition Note (Signed)
Transition of Care [facility]) - CM/SW Discharge Note   Patient Details  Name: THORNELL VASWANI MRN: [id number] Date of Birth: 1950-06-23  Transition of Care [facility]) CM/SW Contact:  Allena Katz, LCSW Phone Number: 08/26/2022, 9:56 AM   Clinical Narrative:    Pt has orders to discharge home with [facility]. [place] with [facility] notified. CSW signing off. No additional needs.     Final next level of care: Home w Home Health Services Barriers to Discharge: Barriers Resolved   Patient Goals and CMS Choice CMS Medicare.gov Compare Post Acute Care list provided to:: Patient Choice offered to / list presented to : Patient  Discharge Placement                         Discharge Plan and Services Additional resources added to the After Visit Summary for                              [facility]        Social Determinants of Health (SDOH) Interventions SDOH Screenings   Food Insecurity: No Food Insecurity (08/19/2022)  Housing: Low Risk  (08/19/2022)  Transportation Needs: No Transportation Needs (08/19/2022)  Utilities: Not At Risk (08/19/2022)  Alcohol Screen: Low Risk  (10/02/2020)  Depression (PHQ2-9): Medium Risk (10/02/2020)  Financial Resource Strain: Low Risk  (01/01/2020)  Physical Activity: Insufficiently Active (01/01/2020)  Social Connections: Moderately Isolated (01/01/2020)  Stress: No Stress Concern Present (01/01/2020)  Tobacco Use: Low Risk  (08/18/2022)     Readmission Risk Interventions    08/24/2022   10:57 AM 05/20/2022   11:41 AM 03/12/2022   10:22 AM  Readmission Risk Prevention Plan  Transportation Screening Complete Complete Complete  Medication Review Oceanographer) Complete Complete Complete  PCP or Specialist appointment within 3-5 days of discharge Complete Complete Complete  HRI or Home Care Consult Complete Complete Complete  SW Recovery Care/Counseling Consult   Complete  Palliative Care Screening  Complete  Not Applicable  Skilled Nursing Facility Complete Complete Not Applicable

## 2022-08-26 NOTE — Inpatient Diabetes Management (Signed)
Inpatient Diabetes Program Recommendations  AACE/ADA: New Consensus Statement on Inpatient Glycemic Control   Target Ranges:  Prepandial:   less than 140 mg/dL      Peak postprandial:   less than 180 mg/dL (1-2 hours)      Critically ill patients:  140 - 180 mg/dL    Latest Reference Range & Units 08/25/22 06:14 08/25/22 06:33 08/25/22 06:45 08/25/22 09:06 08/25/22 11:40 08/25/22 16:39 08/25/22 21:25 08/26/22 07:33  Glucose-Capillary 70 - 99 mg/dL 51 (L) 62 (L) 81 161 (H) 171 (H) 254 (H) 242 (H) 194 (H)   Review of Glycemic Control  Diabetes history: DM1 Outpatient Diabetes medications: Tresiba 13 units daily, Novolog 0-9 units TID with meals Current orders for Inpatient glycemic control: Semglee 10 units daily, Novolog 0-6 units TID with meals, Novolog 0-5 units QHS   Inpatient Diabetes Program Recommendations:     Insulin: Please consider decreasing Semglee to 8 units daily and ordering Novolog 2 units TID with meals for meal coverage if patient eats at least 50% of meals.  Thanks, Orlando Penner, RN, MSN, CDCES Diabetes Coordinator Inpatient Diabetes Program [phone] (Team Pager from 8am to 5pm)

## 2022-08-26 NOTE — Discharge Summary (Signed)
Physician Discharge Summary   Patient: Nicholas Weiss MRN: [id number] DOB: 1950/09/09  Admit date:     08/18/2022  Discharge date: 08/26/22  Discharge Physician: Marrion Coy   PCP: Marisue Ivan, MD   Recommendations at discharge:   Follow-up with PCP in 1 week.  Discharge Diagnoses: Principal Problem:   Near syncope Active Problems:   Hypoglycemia   Multiple myeloma (HCC)   Essential (primary) hypertension   Cocaine abuse (HCC)   AKI (acute kidney injury) (HCC)   Hyponatremia   Orthostatic hypotension   Postural dizziness with presyncope   Uncontrolled type 1 diabetes mellitus with hyperglycemia, with long-term current use of insulin (HCC)  Resolved Problems:   * No resolved hospital problems. *  Hospital Course: From HPI "MAHIN DINSMOOR is a 72 y.o. male with medical history significant of diabetes mellitus on insulin at home reportedly has chronic fatigue.  As per history obtained from patient's partner by the name of [name] over the phone, patient often gets to the dining table and then puts his head down on the dining table and rests, he often lays down on the left seat in the living space, for prolonged periods of time.  In spite of this patient does able to get to the mailbox outside which is about 100 feet away and back.  Also this causes him shortness of breath.  Patient was in his usual state of health earlier today as well.  There has been no fever nausea vomiting diarrhea or any new aches or pains.  Patient continued to have marked fatigue, laying around the house.    At approximately 3 PM, patient had gone to a local hardware store along with his partner [name] to get some supplies.  As the patient got out of the car he was noted to "almost pass out ".  Family member grabbed the patient and prevented the patient from falling down on the ground.  Subsequently patient regained comfort composure and was brought to [facility] ER.  Family member reports that the patient was  also diaphoretic at the time.  Upon arriving the hospital, patient was found to be orthostatic.  Most of blood pressure medicine was discontinued, patient was also started on fluids and midodrine.  Condition has been better, no longer has any dizziness.  Blood pressure running higher, will discontinue midodrine for now.  Patient advised to follow-up with PCP as outpatient.  Assessment and Plan: Postural dizziness with presyncope likely in the setting of drug use As well as orthostatic hypotension due to dehydration EKG had first-degree AV block, additional telemetry did not show any significant bradycardia.  At this point, blood pressure has been better after giving fluids, most of blood pressure medicine was on hold.  At this point, patient is medically stable to be discharged. I will discontinue most of blood pressure medicines.   Polysubstance use disorder Patient uses cocaine and marijuana Counseled on cessation   Essential hypertension Amlodipine and hydralazine discontinued   History of multiple myeloma Outpatient follow-up with oncologist   AKI (acute kidney injury) (HCC) on CKD stage IIIa Renal function normalized after fluids.   Type 1 diabetes uncontrolled with hyperglycemia. Diabetes mellitus with hyperglycemia Hypoglycemia resolved Seem to be better today, long acting insulin dose decreased further to 8 units, continue follow-up with PCP as outpatient.      Consultants: None Procedures performed: None  Disposition: Home health Diet recommendation:  Discharge Diet Orders (From admission, onward)     Start  Ordered   08/26/22 0000  Diet Carb Modified        08/26/22 0944           Carb modified diet DISCHARGE MEDICATION: Allergies as of 08/26/2022       Reactions   Penicillins Other (See Comments)   Childhood allergy -tolerated amoxil 03/2022 Unknown reaction Has patient had a PCN reaction causing immediate rash, facial/tongue/throat swelling, SOB or  lightheadedness with hypotension: No Has patient had a PCN reaction causing severe rash involving mucus membranes or skin necrosis: No Has patient had a PCN reaction that required hospitalization No Has patient had a PCN reaction occurring within the last 10 years: No If all of the above answers are "NO", then may proceed with Cephalosporin use.        Medication List     STOP taking these medications    amLODipine 10 MG tablet Commonly known as: NORVASC   hydrALAZINE 10 MG tablet Commonly known as: APRESOLINE   Pen Needles 3/16" 31G X 5 MM Misc       TAKE these medications    acetaminophen 500 MG tablet Commonly known as: TYLENOL Take 1,000 mg by mouth daily as needed for mild pain or moderate pain.   acyclovir 400 MG tablet Commonly known as: ZOVIRAX Take 1 tablet (400 mg total) by mouth 2 (two) times daily.   ascorbic acid 500 MG tablet Commonly known as: VITAMIN C Take 1 tablet (500 mg total) by mouth daily.   aspirin EC 81 MG tablet Take 81 mg by mouth in the morning.   atorvastatin 80 MG tablet Commonly known as: LIPITOR Take 1 tablet (80 mg total) by mouth at bedtime. Hold while taking Paxlovid   Baclofen 5 MG Tabs Take 5 mg by mouth daily as needed (muscle spasm).   Baqsimi One Pack 3 MG/DOSE Powd Generic drug: Glucagon Place 3 mg into the nose once as needed for up to 1 dose (for low sugar).   clopidogrel 75 MG tablet Commonly known as: PLAVIX Take 75 mg by mouth in the morning.   Dexcom G7 Sensor Misc Use 1 each every 10 (ten) days   docusate sodium 100 MG capsule Commonly known as: COLACE Take 100 mg by mouth daily as needed for mild constipation.   feeding supplement (GLUCERNA SHAKE) Liqd Take 237 mLs by mouth 3 (three) times daily between meals.   lisinopril 2.5 MG tablet Commonly known as: ZESTRIL Take 2.5 mg by mouth daily.   multivitamin with minerals Tabs tablet Take 1 tablet by mouth daily.   NovoLOG 100 UNIT/ML  injection Generic drug: insulin aspart Sliding Scale: Glucose 70 - 120: 0 units  Glucose 121 - 150: 1 unit  Glucose 151 - 200: 2 units Glucose 201 - 250: 3 units  Glucose 251 - 300: 5 units  Glucose 301 - 350: 7 units  Glucose 351 - 400: 9 units  Glucose > 400: call your docotr   ondansetron 8 MG tablet Commonly known as: ZOFRAN Take 8 mg by mouth daily as needed for nausea or vomiting.   pantoprazole 40 MG tablet Commonly known as: Protonix Take 1 tablet (40 mg total) by mouth 2 (two) times daily. Take twice daily for 2 months then decrease to once daily   Tresiba FlexTouch 100 UNIT/ML FlexTouch Pen Generic drug: insulin degludec Inject 8 Units into the skin daily. What changed: how much to take        Follow-up Information     Marisue Ivan,  MD Follow up in 1 week(s).   Specialty: Family Medicine Contact information: [address] [facility] [place] [address] [phone]                Discharge Exam: Ceasar Mons Weights   08/18/22 1617  Weight: 70.3 kg   General exam: Appears calm and comfortable  Respiratory system: Clear to auscultation. Respiratory effort normal. Cardiovascular system: S1 & S2 heard, RRR. No JVD, murmurs, rubs, gallops or clicks. No pedal edema. Gastrointestinal system: Abdomen is nondistended, soft and nontender. No organomegaly or masses felt. Normal bowel sounds heard. Central nervous system: Alert and oriented. No focal neurological deficits. Extremities: Symmetric 5 x 5 power. Skin: No rashes, lesions or ulcers Psychiatry: Judgement and insight appear normal. Mood & affect appropriate. '  Condition at discharge: fair  The results of significant diagnostics from this hospitalization (including imaging, microbiology, ancillary and laboratory) are listed below for reference.   Imaging Studies: ECHOCARDIOGRAM COMPLETE  Result Date: 08/19/2022    ECHOCARDIOGRAM REPORT   Patient Name:   ALANSON DUMONT Date  of Exam: 08/19/2022 Medical Rec #:  [id number]       Height:       71.0 in Accession #:    [id number]      Weight:       155.0 lb Date of Birth:  1950/12/08      BSA:          1.892 m Patient Age:    71 years        BP:           145/93 mmHg Patient Gender: M               HR:           78 bpm. Exam Location:  [facility] Procedure: 2D Echo, Cardiac Doppler and Color Doppler Indications:     Syncope  History:         Patient has prior history of Echocardiogram examinations, most                  recent 04/24/2022. Stroke, Signs/Symptoms:Syncope,                  Dizziness/Lightheadedness and Bacteremia; Risk                  Factors:Hypertension, Diabetes and Dyslipidemia. CKD, Cocaine                  abuse.  Sonographer:     Mikki Harbor Referring Phys:  [id number] [facility] GOEL Diagnosing Phys: Yvonne Kendall MD IMPRESSIONS  1. Left ventricular ejection fraction, by estimation, is 60 to 65%. The left ventricle has normal function. The left ventricle has no regional wall motion abnormalities. There is moderate asymmetric left ventricular hypertrophy of the basal-septal segment. Left ventricular diastolic parameters were normal.  2. Right ventricular systolic function is normal. The right ventricular size is normal. There is normal pulmonary artery systolic pressure.  3. Left atrial size was mildly dilated.  4. A small pericardial effusion is present. The pericardial effusion is anterior to the right ventricle. There is no evidence of cardiac tamponade.  5. The mitral valve is degenerative. Trivial mitral valve regurgitation. No evidence of mitral stenosis.  6. The aortic valve is tricuspid. There is mild thickening of the aortic valve. Aortic valve regurgitation is not visualized. Aortic valve sclerosis is present, with no evidence of aortic valve stenosis.  7. The inferior vena cava is  normal in size with greater than 50% respiratory variability, suggesting right atrial pressure of 3 mmHg. FINDINGS  Left Ventricle: Left  ventricular ejection fraction, by estimation, is 60 to 65%. The left ventricle has normal function. The left ventricle has no regional wall motion abnormalities. The left ventricular internal cavity size was normal in size. There is  moderate asymmetric left ventricular hypertrophy of the basal-septal segment. Left ventricular diastolic parameters were normal. Right Ventricle: The right ventricular size is normal. No increase in right ventricular wall thickness. Right ventricular systolic function is normal. There is normal pulmonary artery systolic pressure. The tricuspid regurgitant velocity is 2.15 m/s, and  with an assumed right atrial pressure of 3 mmHg, the estimated right ventricular systolic pressure is 21.5 mmHg. Left Atrium: Left atrial size was mildly dilated. Right Atrium: Right atrial size was normal in size. Pericardium: A small pericardial effusion is present. The pericardial effusion is anterior to the right ventricle. There is no evidence of cardiac tamponade. Mitral Valve: The mitral valve is degenerative in appearance. There is mild thickening of the mitral valve leaflet(s). Trivial mitral valve regurgitation. No evidence of mitral valve stenosis. MV peak gradient, 6.9 mmHg. The mean mitral valve gradient is  2.0 mmHg. Tricuspid Valve: The tricuspid valve is normal in structure. Tricuspid valve regurgitation is mild. Aortic Valve: The aortic valve is tricuspid. There is mild thickening of the aortic valve. Aortic valve regurgitation is not visualized. Aortic valve sclerosis is present, with no evidence of aortic valve stenosis. Aortic valve mean gradient measures 2.0  mmHg. Aortic valve peak gradient measures 3.0 mmHg. Aortic valve area, by VTI measures 2.89 cm. Pulmonic Valve: The pulmonic valve was normal in structure. Pulmonic valve regurgitation is not visualized. No evidence of pulmonic stenosis. Aorta: The aortic root is normal in size and structure. Pulmonary Artery: The pulmonary artery  is not well seen. Venous: The inferior vena cava is normal in size with greater than 50% respiratory variability, suggesting right atrial pressure of 3 mmHg. IAS/Shunts: No atrial level shunt detected by color flow Doppler.  LEFT VENTRICLE PLAX 2D LVIDd:         4.60 cm   Diastology LVIDs:         2.70 cm   LV e' medial:    14.90 cm/s LV PW:         1.00 cm   LV E/e' medial:  6.6 LV IVS:        1.45 cm   LV e' lateral:   24.50 cm/s LVOT diam:     2.10 cm   LV E/e' lateral: 4.0 LV SV:         57 LV SV Index:   30 LVOT Area:     3.46 cm  RIGHT VENTRICLE RV Basal diam:  3.45 cm RV Mid diam:    2.90 cm RV S prime:     10.60 cm/s TAPSE (M-mode): 2.2 cm LEFT ATRIUM             Index        RIGHT ATRIUM           Index LA diam:        3.90 cm 2.06 cm/m   RA Area:     17.00 cm LA Vol (A2C):   92.9 ml 49.10 ml/m  RA Volume:   44.70 ml  23.62 ml/m LA Vol (A4C):   56.0 ml 29.60 ml/m LA Biplane Vol: 74.2 ml 39.22 ml/m  AORTIC VALVE  PULMONIC VALVE AV Area (Vmax):    3.09 cm     PV Vmax:       0.80 m/s AV Area (Vmean):   2.83 cm     PV Peak grad:  2.6 mmHg AV Area (VTI):     2.89 cm AV Vmax:           86.70 cm/s AV Vmean:          59.800 cm/s AV VTI:            0.199 m AV Peak Grad:      3.0 mmHg AV Mean Grad:      2.0 mmHg LVOT Vmax:         77.40 cm/s LVOT Vmean:        48.800 cm/s LVOT VTI:          0.166 m LVOT/AV VTI ratio: 0.83  AORTA Ao Root diam: 3.60 cm MITRAL VALVE               TRICUSPID VALVE MV Area (PHT): 5.79 cm    TR Peak grad:   18.5 mmHg MV Area VTI:   2.48 cm    TR Vmax:        215.00 cm/s MV Peak grad:  6.9 mmHg MV Mean grad:  2.0 mmHg    SHUNTS MV Vmax:       1.31 m/s    Systemic VTI:  0.17 m MV Vmean:      64.7 cm/s   Systemic Diam: 2.10 cm MV Decel Time: 131 msec MV E velocity: 98.50 cm/s MV A velocity: 50.80 cm/s MV E/A ratio:  1.94 Cristal Deer End MD Electronically signed by Yvonne Kendall MD Signature Date/Time: 08/19/2022/5:42:01 PM    Final    DG Chest 2 View  Result  Date: 08/18/2022 CLINICAL DATA:  Dizziness and syncope. EXAM: CHEST - 2 VIEW COMPARISON:  Chest radiograph dated 07/09/2022. FINDINGS: No focal consolidation, pleural effusion, or pneumothorax. The cardiac silhouette is within normal limits. Atherosclerotic calcification of the aorta. No acute osseous pathology. IMPRESSION: No active cardiopulmonary disease. Electronically Signed   By: Elgie Collard M.D.   On: 08/18/2022 23:44    Microbiology: Results for orders placed or performed during the hospital encounter of 08/18/22  Culture, blood (Routine X 2) w Reflex to ID Panel     Status: None   Collection Time: 08/19/22 12:04 AM   Specimen: BLOOD  Result Value Ref Range Status   Specimen Description BLOOD BLOOD RIGHT ARM  Final   Special Requests   Final    BOTTLES DRAWN AEROBIC AND ANAEROBIC Blood Culture results may not be optimal due to an excessive volume of blood received in culture bottles   Culture   Final    NO GROWTH 5 DAYS Performed at [facility], [address], [place], [place] [address]    Report Status 08/24/2022 FINAL  Final  Culture, blood (Routine X 2) w Reflex to ID Panel     Status: None   Collection Time: 08/19/22 12:05 AM   Specimen: BLOOD  Result Value Ref Range Status   Specimen Description BLOOD BLOOD RIGHT HAND  Final   Special Requests   Final    BOTTLES DRAWN AEROBIC ONLY Blood Culture adequate volume   Culture   Final    NO GROWTH 5 DAYS Performed at [facility], [address], [place], [place] [address]    Report Status 08/24/2022 FINAL  Final    Labs: CBC: Recent  Labs  Lab 08/21/22 0530 08/22/22 0509 08/24/22 0451 08/25/22 0432 08/26/22 0413  WBC 3.8* 2.4* 2.4* 3.6* 2.9*  NEUTROABS 2.7 1.8 1.2* 1.6* 1.3*  HGB 8.6* 8.5* 7.8* 8.2* 8.2*  HCT 25.5* 25.9* 23.2* 24.9* 24.9*  MCV 92.1 93.5 90.6 91.9 91.2  PLT 191 153 171 192 191   Basic Metabolic Panel: Recent Labs  Lab 08/22/22 0509 08/23/22 0515 08/24/22 0451  08/25/22 0432 08/26/22 0413  NA 132* 135 133* 135 133*  K 4.3 3.5 3.8 3.5 4.0  CL 106 107 105 106 106  CO2 21* 24 22 21* 22  GLUCOSE 284* 40* 129* 43* 195*  BUN 25* 19 17 20 20   CREATININE 1.48* 1.18 1.32* 1.14 1.15  CALCIUM 8.1* 8.0* 7.8* 8.0* 8.1*   Liver Function Tests: No results for input(s): "AST", "ALT", "ALKPHOS", "BILITOT", "PROT", "ALBUMIN" in the last 168 hours. CBG: Recent Labs  Lab 08/25/22 0906 08/25/22 1140 08/25/22 1639 08/25/22 2125 08/26/22 0733  GLUCAP 150* 171* 254* 242* 194*    Discharge time spent: greater than 30 minutes.  Signed: Marrion Coy, MD [facility] 08/26/2022

## 2022-08-31 ENCOUNTER — Other Ambulatory Visit: Payer: Self-pay | Admitting: Oncology

## 2022-08-31 DIAGNOSIS — C9000 Multiple myeloma not having achieved remission: Secondary | ICD-10-CM

## 2022-09-01 ENCOUNTER — Inpatient Hospital Stay: Payer: 59 | Attending: Oncology

## 2022-09-01 ENCOUNTER — Other Ambulatory Visit: Payer: Self-pay | Admitting: Oncology

## 2022-09-01 ENCOUNTER — Other Ambulatory Visit: Payer: Self-pay | Admitting: *Deleted

## 2022-09-01 ENCOUNTER — Encounter: Payer: Self-pay | Admitting: Oncology

## 2022-09-01 ENCOUNTER — Inpatient Hospital Stay (HOSPITAL_BASED_OUTPATIENT_CLINIC_OR_DEPARTMENT_OTHER): Payer: 59 | Admitting: Oncology

## 2022-09-01 ENCOUNTER — Other Ambulatory Visit: Payer: Self-pay | Admitting: Pharmacist

## 2022-09-01 VITALS — BP 137/84 | HR 75 | Temp 96.0°F | Wt 155.0 lb

## 2022-09-01 DIAGNOSIS — Z7961 Long term (current) use of immunomodulator: Secondary | ICD-10-CM | POA: Insufficient documentation

## 2022-09-01 DIAGNOSIS — C9000 Multiple myeloma not having achieved remission: Secondary | ICD-10-CM | POA: Diagnosis present

## 2022-09-01 DIAGNOSIS — Z79899 Other long term (current) drug therapy: Secondary | ICD-10-CM | POA: Diagnosis not present

## 2022-09-01 DIAGNOSIS — Z5112 Encounter for antineoplastic immunotherapy: Secondary | ICD-10-CM | POA: Diagnosis present

## 2022-09-01 DIAGNOSIS — E1022 Type 1 diabetes mellitus with diabetic chronic kidney disease: Secondary | ICD-10-CM | POA: Insufficient documentation

## 2022-09-01 DIAGNOSIS — Z5111 Encounter for antineoplastic chemotherapy: Secondary | ICD-10-CM

## 2022-09-01 DIAGNOSIS — N183 Chronic kidney disease, stage 3 unspecified: Secondary | ICD-10-CM | POA: Insufficient documentation

## 2022-09-01 DIAGNOSIS — D631 Anemia in chronic kidney disease: Secondary | ICD-10-CM | POA: Insufficient documentation

## 2022-09-01 DIAGNOSIS — Z79624 Long term (current) use of inhibitors of nucleotide synthesis: Secondary | ICD-10-CM | POA: Insufficient documentation

## 2022-09-01 DIAGNOSIS — Z794 Long term (current) use of insulin: Secondary | ICD-10-CM | POA: Diagnosis not present

## 2022-09-01 DIAGNOSIS — Z7902 Long term (current) use of antithrombotics/antiplatelets: Secondary | ICD-10-CM | POA: Diagnosis not present

## 2022-09-01 MED ORDER — LENALIDOMIDE 10 MG PO CAPS
10.0000 mg | ORAL_CAPSULE | Freq: Every day | ORAL | 0 refills | Status: DC
Start: 2022-09-01 — End: 2022-10-09

## 2022-09-01 MED ORDER — ACETAMINOPHEN 325 MG PO TABS
650.0000 mg | ORAL_TABLET | Freq: Once | ORAL | Status: AC
  Administered 2022-09-01: 650 mg via ORAL
  Filled 2022-09-01: qty 2

## 2022-09-01 MED ORDER — BORTEZOMIB CHEMO SQ INJECTION 3.5 MG (2.5MG/ML)
1.3000 mg/m2 | Freq: Once | INTRAMUSCULAR | Status: AC
  Administered 2022-09-01: 2.5 mg via SUBCUTANEOUS
  Filled 2022-09-01: qty 1

## 2022-09-01 MED ORDER — DIPHENHYDRAMINE HCL 25 MG PO CAPS
50.0000 mg | ORAL_CAPSULE | Freq: Once | ORAL | Status: AC
  Administered 2022-09-01: 50 mg via ORAL
  Filled 2022-09-01: qty 2

## 2022-09-01 MED ORDER — DEXAMETHASONE 4 MG PO TABS
10.0000 mg | ORAL_TABLET | Freq: Once | ORAL | Status: AC
  Administered 2022-09-01: 10 mg via ORAL
  Filled 2022-09-01: qty 3

## 2022-09-01 MED ORDER — DARATUMUMAB-HYALURONIDASE-FIHJ 1800-30000 MG-UT/15ML ~~LOC~~ SOLN
1800.0000 mg | Freq: Once | SUBCUTANEOUS | Status: AC
  Administered 2022-09-01: 1800 mg via SUBCUTANEOUS
  Filled 2022-09-01: qty 15

## 2022-09-01 NOTE — Progress Notes (Signed)
Pt observed for 1 hour after Darzalex SQ per protocol without complications. VSS. Pt stable at discharge. RN educated pt on the importance of notifying the clinic if any complications occur at home. Pt verbalized understanding and all questions answered at this time.   Nicholas Weiss [employer]

## 2022-09-01 NOTE — Progress Notes (Signed)
Hematology/Oncology Consult note [facility]  Telephone:([phone] Fax:([phone]  Patient Care Team: Marisue Ivan, MD as PCP - General (Family Medicine) [place], [facility] (Optometry) Creig Hines, MD as Consulting Physician (Oncology)   Name of the patient: Nicholas Weiss  [id number]  11-02-1950   Date of visit: 09/01/22  Diagnosis-  high risk IgG lambda multiple myeloma R-ISS stage III     Chief complaint/ Reason for visit-on treatment assessment prior to cycle 1 day 8 of DRVVT chemotherapy  Heme/Onc history: Patient is a 72 year old African-American male with a past medical history significant for uncontrolled type 1 diabetes, chronic kidney disease stage III chronic normocytic anemia referred for abnormal SPEP. Patient's creatinine has been fluctuating between 1.5-2 but about 5 months ago it went up all the way to 5 and his blood sugars were in the 1000 range. More recently his kidney numbers have been drifting back to normal values. As a part of the work-up he had serum protein electrophoresis done which showed an elevated gammaglobulin fraction with an M spike of 3%. The amount of M spike was not quantified in the specimen. He has therefore been referred to [place] for further management. Patient endorses chronic fatigue reports that his appetite and weight have remained stable   Results of blood work from 04/08/2020 were as follows: CBC showed white count of 2.9, H&H of 10.5/31.2 with an MCV of 91 and a platelet count of 191.  CMP showed a mildly elevated creatinine of 1.2 which was better as compared to 5 months ago when it was 3.9.  Total protein was mildly elevated at 9.1 calcium normal at 8.4 ferritin and iron studies B12 folate TSH and haptoglobin were normal.  Myeloma panel revealed an elevated IgG level of 06/04/2004 with an M protein of 3.1 g.  Immunofixation showed IgG lambda specificity.  Serum free light chain ratio was elevated at 25 and  free light chain lambda elevated at 370   Further myeloma work-up including a PET CT scan did not reveal any evidence of edematous lesions.  Bone marrow biopsy showed 23% plasma cells by manual count and 30% by CD138 IHC.  Normal cytogenetics.  FISH studies for myeloma showed gain of 1 q.  13 q-. detected.  P53 not detected.   He was treated for a year and has having smoldering multiple myeloma since both CKD and anemia have been stable for 3 to 4 years and could be secondary to uncontrolled diabetes.In June 2023 IgG levels increased to 5463 with an M protein of 4.1 g as compared to 2.6 g in September 2022.  Serum free light chain ratio remains around 25.  Therefore a repeat bone marrow biopsy was done which shows further increase in plasma cells ranging from 30 to 70% and by immunohistochemistry 60 to 70% of the cells were positive for CD138.  Cytogenetics normal.  FISH study showed 4: 14 translocation and gain of 1 q. making this high risk RISS stage III.  Repeat PET scan showed no lytic lesions   Patient is not a transplant candidate and was started on Velcade Revlimid dexamethasone regimen in July 2023.  Treatment complicated by repeated hospitalizations for uncontrolled blood sugars.  He has been frequently hospitalized thereby causing multiple interruptions in his treatment  Interval history-patient received cycle 1 day one of DRVD chemotherapy on 08/06/2022 and subsequent treatments were positive because of hospitalization.  Today he states overall he feels better.  Denies any significant back  pain  ECOG PS- 2 Pain scale- 0   Review of systems- Review of Systems  Constitutional:  Positive for malaise/fatigue. Negative for chills, fever and weight loss.  HENT:  Negative for congestion, ear discharge and nosebleeds.   Eyes:  Negative for blurred vision.  Respiratory:  Negative for cough, hemoptysis, sputum production, shortness of breath and wheezing.   Cardiovascular:  Negative for chest pain,  palpitations, orthopnea and claudication.  Gastrointestinal:  Negative for abdominal pain, blood in stool, constipation, diarrhea, heartburn, melena, nausea and vomiting.  Genitourinary:  Negative for dysuria, flank pain, frequency, hematuria and urgency.  Musculoskeletal:  Negative for back pain, joint pain and myalgias.  Skin:  Negative for rash.  Neurological:  Negative for dizziness, tingling, focal weakness, seizures, weakness and headaches.  Endo/Heme/Allergies:  Does not bruise/bleed easily.  Psychiatric/Behavioral:  Negative for depression and suicidal ideas. The patient does not have insomnia.       Allergies  Allergen Reactions   Penicillins Other (See Comments)    Childhood allergy -tolerated amoxil 03/2022 Unknown reaction Has patient had a PCN reaction causing immediate rash, facial/tongue/throat swelling, SOB or lightheadedness with hypotension: No Has patient had a PCN reaction causing severe rash involving mucus membranes or skin necrosis: No Has patient had a PCN reaction that required hospitalization No Has patient had a PCN reaction occurring within the last 10 years: No If all of the above answers are "NO", then may proceed with Cephalosporin use.      Past Medical History:  Diagnosis Date   Acute metabolic encephalopathy 12/02/2020   DKA (diabetic ketoacidoses) 04/06/2016   Hypercholesteremia    Hypertension      Past Surgical History:  Procedure Laterality Date   AMPUTATION TOE Left 04/25/2022   Procedure: LEFT GREAT TOE AMPUTATION AND LEFT PARTIAL 2ND TOE AMPUTATION;  Surgeon: Felecia Shelling, DPM;  Location: [facility] ORS;  Service: Podiatry;  Laterality: Left;   COLONOSCOPY WITH PROPOFOL N/A 02/01/2020   Procedure: COLONOSCOPY WITH PROPOFOL;  Surgeon: Toney Reil, MD;  Location: [facility] ENDOSCOPY;  Service: Gastroenterology;  Laterality: N/A;   ESOPHAGOGASTRODUODENOSCOPY  02/01/2020   Procedure: ESOPHAGOGASTRODUODENOSCOPY (EGD);  Surgeon: Toney Reil, MD;  Location: [facility] ENDOSCOPY;  Service: Gastroenterology;;   ESOPHAGOGASTRODUODENOSCOPY (EGD) WITH PROPOFOL N/A 06/07/2022   Procedure: ESOPHAGOGASTRODUODENOSCOPY (EGD) WITH PROPOFOL;  Surgeon: Kerin Salen, MD;  Location: [facility] ENDOSCOPY;  Service: Gastroenterology;  Laterality: N/A;   KNEE SURGERY Right    Torn meniscus   KNEE SURGERY Left     Social History   Socioeconomic History   Marital status: Single    Spouse name: Not on file   Number of children: 3   Years of education: Not on file   Highest education level: High school graduate  Occupational History    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  Tobacco Use   Smoking status: Never   Smokeless tobacco: Never  Vaping Use   Vaping Use: Never used  Substance and Sexual Activity   Alcohol use: Not Currently    Alcohol/week: 0.0 - 1.0 standard drinks of alcohol    Comment: "once every 2 months"   Drug use: Yes    Types: Marijuana, "Crack" cocaine    Comment: last week   Sexual activity: Not Currently    Birth control/protection: None  Other Topics Concern   Not on file  Social History Narrative   Lives with girlfriend "sharon"   Social Determinants of Health   Financial Resource Strain: Low Risk  (01/01/2020)  Overall Financial Resource Strain (CARDIA)    Difficulty of Paying Living Expenses: Not hard at all  Food Insecurity: No Food Insecurity (08/19/2022)   Hunger Vital Sign    Worried About Running Out of Food in the Last Year: Never true    Ran Out of Food in the Last Year: Never true  Transportation Needs: No Transportation Needs (08/19/2022)   PRAPARE - Administrator, Civil Service (Medical): No    Lack of Transportation (Non-Medical): No  Physical Activity: Insufficiently Active (01/01/2020)   Exercise Vital Sign    Days of Exercise per Week: 7 days    Minutes of Exercise per Session: 20 min  Stress: No Stress Concern Present (01/01/2020)   [employer] of Occupational Health - Occupational  Stress Questionnaire    Feeling of Stress : Not at all  Social Connections: Moderately Isolated (01/01/2020)   Social Connection and Isolation Panel [NHANES]    Frequency of Communication with Friends and Family: More than three times a week    Frequency of Social Gatherings with Friends and Family: More than three times a week    Attends Religious Services: Never    Database administrator or Organizations: No    Attends Banker Meetings: Never    Marital Status: Living with partner  Intimate Partner Violence: Not At Risk (08/19/2022)   Humiliation, Afraid, Rape, and Kick questionnaire    Fear of Current or Ex-Partner: No    Emotionally Abused: No    Physically Abused: No    Sexually Abused: No    Family History  Problem Relation Age of Onset   Heart attack Father    Hypertension Sister    Cancer Sister      Current Outpatient Medications:    acetaminophen (TYLENOL) 500 MG tablet, Take 1,000 mg by mouth daily as needed for mild pain or moderate pain., Disp: , Rfl:    acyclovir (ZOVIRAX) 400 MG tablet, Take 1 tablet (400 mg total) by mouth 2 (two) times daily., Disp: 60 tablet, Rfl: 2   ascorbic acid (VITAMIN C) 500 MG tablet, Take 1 tablet (500 mg total) by mouth daily., Disp: 90 tablet, Rfl: 0   aspirin EC 81 MG tablet, Take 81 mg by mouth in the morning., Disp: , Rfl:    atorvastatin (LIPITOR) 80 MG tablet, Take 1 tablet (80 mg total) by mouth at bedtime. Hold while taking Paxlovid, Disp: 30 tablet, Rfl: 2   Baclofen 5 MG TABS, Take 5 mg by mouth daily as needed (muscle spasm)., Disp: , Rfl:    clopidogrel (PLAVIX) 75 MG tablet, Take 75 mg by mouth in the morning., Disp: , Rfl:    Continuous Glucose Sensor (DEXCOM G7 SENSOR) MISC, Use 1 each every 10 (ten) days, Disp: , Rfl:    docusate sodium (COLACE) 100 MG capsule, Take 100 mg by mouth daily as needed for mild constipation., Disp: , Rfl:    feeding supplement, GLUCERNA SHAKE, (GLUCERNA SHAKE) LIQD, Take 237 mLs by  mouth 3 (three) times daily between meals., Disp: , Rfl: 0   Glucagon (BAQSIMI ONE PACK) 3 MG/DOSE POWD, Place 3 mg into the nose once as needed for up to 1 dose (for low sugar)., Disp: 1 each, Rfl: 1   insulin degludec (TRESIBA FLEXTOUCH) 100 UNIT/ML FlexTouch Pen, Inject 8 Units into the skin daily., Disp: , Rfl:    lisinopril (ZESTRIL) 2.5 MG tablet, Take 2.5 mg by mouth daily., Disp: , Rfl:    Multiple  Vitamin (MULTIVITAMIN WITH MINERALS) TABS tablet, Take 1 tablet by mouth daily., Disp: 90 tablet, Rfl: 1   NOVOLOG 100 UNIT/ML injection, Sliding Scale: Glucose 70 - 120: 0 units  Glucose 121 - 150: 1 unit  Glucose 151 - 200: 2 units Glucose 201 - 250: 3 units  Glucose 251 - 300: 5 units  Glucose 301 - 350: 7 units  Glucose 351 - 400: 9 units  Glucose > 400: call your docotr, Disp: 10 mL, Rfl: 1   ondansetron (ZOFRAN) 8 MG tablet, Take 8 mg by mouth daily as needed for nausea or vomiting., Disp: , Rfl:    pantoprazole (PROTONIX) 40 MG tablet, Take 1 tablet (40 mg total) by mouth 2 (two) times daily. Take twice daily for 2 months then decrease to once daily, Disp: , Rfl:   Physical exam: There were no vitals filed for this visit. Physical Exam Cardiovascular:     Rate and Rhythm: Normal rate and regular rhythm.     Heart sounds: Normal heart sounds.  Pulmonary:     Effort: Pulmonary effort is normal.     Breath sounds: Normal breath sounds.  Abdominal:     General: Bowel sounds are normal.     Palpations: Abdomen is soft.  Musculoskeletal:     Comments: Bilateral +1 edema  Skin:    General: Skin is warm and dry.  Neurological:     Mental Status: He is alert and oriented to person, place, and time.         Latest Ref Rng & Units 08/26/2022    4:13 AM  CMP  Glucose 70 - 99 mg/dL 161   BUN 8 - 23 mg/dL 20   Creatinine 0.96 - 1.24 mg/dL 0.45   Sodium 409 - 811 mmol/L 133   Potassium 3.5 - 5.1 mmol/L 4.0   Chloride 98 - 111 mmol/L 106   CO2 22 - 32 mmol/L 22   Calcium 8.9 - 10.3  mg/dL 8.1       Latest Ref Rng & Units 08/26/2022    4:13 AM  CBC  WBC 4.0 - 10.5 K/uL 2.9   Hemoglobin 13.0 - 17.0 g/dL 8.2   Hematocrit 91.4 - 52.0 % 24.9   Platelets 150 - 400 K/uL 191     No images are attached to the encounter.  ECHOCARDIOGRAM COMPLETE  Result Date: 08/19/2022    ECHOCARDIOGRAM REPORT   Patient Name:   Nicholas Weiss Date of Exam: 08/19/2022 Medical Rec #:  [id number]       Height:       71.0 in Accession #:    [id number]      Weight:       155.0 lb Date of Birth:  06/01/1950      BSA:          1.892 m Patient Age:    71 years        BP:           145/93 mmHg Patient Gender: M               HR:           78 bpm. Exam Location:  [facility] Procedure: 2D Echo, Cardiac Doppler and Color Doppler Indications:     Syncope  History:         Patient has prior history of Echocardiogram examinations, most                  recent 04/24/2022. Stroke,  Signs/Symptoms:Syncope,                  Dizziness/Lightheadedness and Bacteremia; Risk                  Factors:Hypertension, Diabetes and Dyslipidemia. CKD, Cocaine                  abuse.  Sonographer:     Mikki Harbor Referring Phys:  [id number] [facility] GOEL Diagnosing Phys: Yvonne Kendall MD IMPRESSIONS  1. Left ventricular ejection fraction, by estimation, is 60 to 65%. The left ventricle has normal function. The left ventricle has no regional wall motion abnormalities. There is moderate asymmetric left ventricular hypertrophy of the basal-septal segment. Left ventricular diastolic parameters were normal.  2. Right ventricular systolic function is normal. The right ventricular size is normal. There is normal pulmonary artery systolic pressure.  3. Left atrial size was mildly dilated.  4. A small pericardial effusion is present. The pericardial effusion is anterior to the right ventricle. There is no evidence of cardiac tamponade.  5. The mitral valve is degenerative. Trivial mitral valve regurgitation. No evidence of mitral stenosis.  6. The  aortic valve is tricuspid. There is mild thickening of the aortic valve. Aortic valve regurgitation is not visualized. Aortic valve sclerosis is present, with no evidence of aortic valve stenosis.  7. The inferior vena cava is normal in size with greater than 50% respiratory variability, suggesting right atrial pressure of 3 mmHg. FINDINGS  Left Ventricle: Left ventricular ejection fraction, by estimation, is 60 to 65%. The left ventricle has normal function. The left ventricle has no regional wall motion abnormalities. The left ventricular internal cavity size was normal in size. There is  moderate asymmetric left ventricular hypertrophy of the basal-septal segment. Left ventricular diastolic parameters were normal. Right Ventricle: The right ventricular size is normal. No increase in right ventricular wall thickness. Right ventricular systolic function is normal. There is normal pulmonary artery systolic pressure. The tricuspid regurgitant velocity is 2.15 m/s, and  with an assumed right atrial pressure of 3 mmHg, the estimated right ventricular systolic pressure is 21.5 mmHg. Left Atrium: Left atrial size was mildly dilated. Right Atrium: Right atrial size was normal in size. Pericardium: A small pericardial effusion is present. The pericardial effusion is anterior to the right ventricle. There is no evidence of cardiac tamponade. Mitral Valve: The mitral valve is degenerative in appearance. There is mild thickening of the mitral valve leaflet(s). Trivial mitral valve regurgitation. No evidence of mitral valve stenosis. MV peak gradient, 6.9 mmHg. The mean mitral valve gradient is  2.0 mmHg. Tricuspid Valve: The tricuspid valve is normal in structure. Tricuspid valve regurgitation is mild. Aortic Valve: The aortic valve is tricuspid. There is mild thickening of the aortic valve. Aortic valve regurgitation is not visualized. Aortic valve sclerosis is present, with no evidence of aortic valve stenosis. Aortic valve  mean gradient measures 2.0  mmHg. Aortic valve peak gradient measures 3.0 mmHg. Aortic valve area, by VTI measures 2.89 cm. Pulmonic Valve: The pulmonic valve was normal in structure. Pulmonic valve regurgitation is not visualized. No evidence of pulmonic stenosis. Aorta: The aortic root is normal in size and structure. Pulmonary Artery: The pulmonary artery is not well seen. Venous: The inferior vena cava is normal in size with greater than 50% respiratory variability, suggesting right atrial pressure of 3 mmHg. IAS/Shunts: No atrial level shunt detected by color flow Doppler.  LEFT VENTRICLE PLAX 2D LVIDd:  4.60 cm   Diastology LVIDs:         2.70 cm   LV e' medial:    14.90 cm/s LV PW:         1.00 cm   LV E/e' medial:  6.6 LV IVS:        1.45 cm   LV e' lateral:   24.50 cm/s LVOT diam:     2.10 cm   LV E/e' lateral: 4.0 LV SV:         57 LV SV Index:   30 LVOT Area:     3.46 cm  RIGHT VENTRICLE RV Basal diam:  3.45 cm RV Mid diam:    2.90 cm RV S prime:     10.60 cm/s TAPSE (M-mode): 2.2 cm LEFT ATRIUM             Index        RIGHT ATRIUM           Index LA diam:        3.90 cm 2.06 cm/m   RA Area:     17.00 cm LA Vol (A2C):   92.9 ml 49.10 ml/m  RA Volume:   44.70 ml  23.62 ml/m LA Vol (A4C):   56.0 ml 29.60 ml/m LA Biplane Vol: 74.2 ml 39.22 ml/m  AORTIC VALVE                    PULMONIC VALVE AV Area (Vmax):    3.09 cm     PV Vmax:       0.80 m/s AV Area (Vmean):   2.83 cm     PV Peak grad:  2.6 mmHg AV Area (VTI):     2.89 cm AV Vmax:           86.70 cm/s AV Vmean:          59.800 cm/s AV VTI:            0.199 m AV Peak Grad:      3.0 mmHg AV Mean Grad:      2.0 mmHg LVOT Vmax:         77.40 cm/s LVOT Vmean:        48.800 cm/s LVOT VTI:          0.166 m LVOT/AV VTI ratio: 0.83  AORTA Ao Root diam: 3.60 cm MITRAL VALVE               TRICUSPID VALVE MV Area (PHT): 5.79 cm    TR Peak grad:   18.5 mmHg MV Area VTI:   2.48 cm    TR Vmax:        215.00 cm/s MV Peak grad:  6.9 mmHg MV Mean  grad:  2.0 mmHg    SHUNTS MV Vmax:       1.31 m/s    Systemic VTI:  0.17 m MV Vmean:      64.7 cm/s   Systemic Diam: 2.10 cm MV Decel Time: 131 msec MV E velocity: 98.50 cm/s MV A velocity: 50.80 cm/s MV E/A ratio:  1.94 Cristal Deer End MD Electronically signed by Yvonne Kendall MD Signature Date/Time: 08/19/2022/5:42:01 PM    Final    DG Chest 2 View  Result Date: 08/18/2022 CLINICAL DATA:  Dizziness and syncope. EXAM: CHEST - 2 VIEW COMPARISON:  Chest radiograph dated 07/09/2022. FINDINGS: No focal consolidation, pleural effusion, or pneumothorax. The cardiac silhouette is within normal limits. Atherosclerotic calcification of the aorta. No acute osseous  pathology. IMPRESSION: No active cardiopulmonary disease. Electronically Signed   By: Elgie Collard M.D.   On: 08/18/2022 23:44     Assessment and plan- Patient is a 72 y.o. male with R-ISS stage III high risk IgG lambda multiple myeloma.  He is here for on treatment assessment prior to cycle 1 day 8 of DRVVT chemotherapy  Counts okay to proceed with cycle 1 day 8 of the RVD chemotherapy today.  I will see him back in 1 week for cycle 1 day 15.  I will consider getting him Retacrit at that time and I will also check his myeloma panel and serum free light chains.  There has been difficult to assess response to treatment due to multiple treatment interruptions.  Continue with aspirin and acyclovir prophylaxis.He will continue with Revlimid 10 mg 2 weeks on 1 week off.  We will renew his prescription at this time   Visit Diagnosis 1. Encounter for antineoplastic chemotherapy   2. Multiple myeloma not having achieved remission (HCC)   3. High risk medication use      Dr. Owens Shark, MD, MPH [facility] at [facility] [id number] 09/01/2022 4:37 PM

## 2022-09-01 NOTE — Patient Instructions (Signed)
[facility]  Discharge Instructions: Thank you for choosing [facility] to provide your oncology and hematology care.  If you have a lab appointment with the Cancer Center, please go directly to the Cancer Center and check in at the registration area.  Wear comfortable clothing and clothing appropriate for easy access to any Portacath or PICC line.   We strive to give you quality time with your provider. You may need to reschedule your appointment if you arrive late (15 or more minutes).  Arriving late affects you and other patients whose appointments are after yours.  Also, if you miss three or more appointments without notifying the office, you may be dismissed from the clinic at the provider's discretion.      For prescription refill requests, have your pharmacy contact our office and allow 72 hours for refills to be completed.    Today you received the following chemotherapy and/or immunotherapy agents Darzalex and Velcade    To help prevent nausea and vomiting after your treatment, we encourage you to take your nausea medication as directed.  BELOW ARE SYMPTOMS THAT SHOULD BE REPORTED IMMEDIATELY: *FEVER GREATER THAN 100.4 F (38 C) OR HIGHER *CHILLS OR SWEATING *NAUSEA AND VOMITING THAT IS NOT CONTROLLED WITH YOUR NAUSEA MEDICATION *UNUSUAL SHORTNESS OF BREATH *UNUSUAL BRUISING OR BLEEDING *URINARY PROBLEMS (pain or burning when urinating, or frequent urination) *BOWEL PROBLEMS (unusual diarrhea, constipation, pain near the anus) TENDERNESS IN MOUTH AND THROAT WITH OR WITHOUT PRESENCE OF ULCERS (sore throat, sores in mouth, or a toothache) UNUSUAL RASH, SWELLING OR PAIN  UNUSUAL VAGINAL DISCHARGE OR ITCHING   Items with * indicate a potential emergency and should be followed up as soon as possible or go to the Emergency Department if any problems should occur.  Please show the CHEMOTHERAPY ALERT CARD or IMMUNOTHERAPY ALERT CARD at  check-in to the Emergency Department and triage nurse.  Should you have questions after your visit or need to cancel or reschedule your appointment, please contact [facility]  [phone] and follow the prompts.  Office hours are 8:00 a.m. to 4:30 p.m. Monday - Friday. Please note that voicemails left after 4:00 p.m. may not be returned until the following business day.  We are closed weekends and major holidays. You have access to a nurse at all times for urgent questions. Please call the main number to the clinic [phone] and follow the prompts.  For any non-urgent questions, you may also contact your provider using MyChart. We now offer e-Visits for anyone 59 and older to request care online for non-urgent symptoms. For details visit [email or web address].   Also download the MyChart app! Go to the app store, search "MyChart", open the app, select [place], and log in with your MyChart username and password.

## 2022-09-01 NOTE — Progress Notes (Signed)
ANC 1.3 ok to proceed

## 2022-09-01 NOTE — Progress Notes (Signed)
MD resuming treatment with Revlimid, refill rx sent to [facility]

## 2022-09-08 ENCOUNTER — Ambulatory Visit: Payer: 59

## 2022-09-08 ENCOUNTER — Inpatient Hospital Stay: Payer: 59

## 2022-09-08 ENCOUNTER — Inpatient Hospital Stay (HOSPITAL_BASED_OUTPATIENT_CLINIC_OR_DEPARTMENT_OTHER): Payer: 59 | Admitting: Oncology

## 2022-09-08 ENCOUNTER — Encounter: Payer: Self-pay | Admitting: Oncology

## 2022-09-08 VITALS — BP 119/76 | HR 89 | Temp 97.9°F | Wt 155.0 lb

## 2022-09-08 DIAGNOSIS — C9000 Multiple myeloma not having achieved remission: Secondary | ICD-10-CM | POA: Diagnosis not present

## 2022-09-08 DIAGNOSIS — D649 Anemia, unspecified: Secondary | ICD-10-CM

## 2022-09-08 DIAGNOSIS — Z79899 Other long term (current) drug therapy: Secondary | ICD-10-CM

## 2022-09-08 DIAGNOSIS — D631 Anemia in chronic kidney disease: Secondary | ICD-10-CM

## 2022-09-08 DIAGNOSIS — Z5112 Encounter for antineoplastic immunotherapy: Secondary | ICD-10-CM | POA: Diagnosis not present

## 2022-09-08 DIAGNOSIS — Z5111 Encounter for antineoplastic chemotherapy: Secondary | ICD-10-CM | POA: Diagnosis not present

## 2022-09-08 LAB — COMPREHENSIVE METABOLIC PANEL
ALT: 34 U/L (ref 0–44)
AST: 33 U/L (ref 15–41)
Albumin: 3.3 g/dL — ABNORMAL LOW (ref 3.5–5.0)
Alkaline Phosphatase: 49 U/L (ref 38–126)
Anion gap: 6 (ref 5–15)
BUN: 49 mg/dL — ABNORMAL HIGH (ref 8–23)
CO2: 25 mmol/L (ref 22–32)
Calcium: 8.5 mg/dL — ABNORMAL LOW (ref 8.9–10.3)
Chloride: 100 mmol/L (ref 98–111)
Creatinine, Ser: 2.19 mg/dL — ABNORMAL HIGH (ref 0.61–1.24)
GFR, Estimated: 32 mL/min — ABNORMAL LOW (ref >60)
Glucose, Bld: 344 mg/dL — ABNORMAL HIGH (ref 70–99)
Potassium: 4.6 mmol/L (ref 3.5–5.1)
Sodium: 131 mmol/L — ABNORMAL LOW (ref 135–145)
Total Bilirubin: 0.4 mg/dL (ref 0.3–1.2)
Total Protein: 8.4 g/dL — ABNORMAL HIGH (ref 6.5–8.1)

## 2022-09-08 LAB — CBC WITH DIFFERENTIAL/PLATELET
Abs Immature Granulocytes: 0.02 10*3/uL (ref 0.00–0.07)
Basophils Absolute: 0 10*3/uL (ref 0.0–0.1)
Basophils Relative: 0 %
Eosinophils Absolute: 0.1 10*3/uL (ref 0.0–0.5)
Eosinophils Relative: 1 %
HCT: 24.9 % — ABNORMAL LOW (ref 39.0–52.0)
Hemoglobin: 8 g/dL — ABNORMAL LOW (ref 13.0–17.0)
Immature Granulocytes: 1 %
Lymphocytes Relative: 26 %
Lymphs Abs: 1.1 10*3/uL (ref 0.7–4.0)
MCH: 30.9 pg (ref 26.0–34.0)
MCHC: 32.1 g/dL (ref 30.0–36.0)
MCV: 96.1 fL (ref 80.0–100.0)
Monocytes Absolute: 0.5 10*3/uL (ref 0.1–1.0)
Monocytes Relative: 12 %
Neutro Abs: 2.6 10*3/uL (ref 1.7–7.7)
Neutrophils Relative %: 60 %
Platelets: 263 10*3/uL (ref 150–400)
RBC: 2.59 MIL/uL — ABNORMAL LOW (ref 4.22–5.81)
RDW: 15.6 % — ABNORMAL HIGH (ref 11.5–15.5)
WBC: 4.3 10*3/uL (ref 4.0–10.5)
nRBC: 0 % (ref 0.0–0.2)

## 2022-09-08 MED ORDER — EPOETIN ALFA-EPBX 40000 UNIT/ML IJ SOLN
40000.0000 [IU] | INTRAMUSCULAR | Status: DC
  Administered 2022-09-08: 40000 [IU] via SUBCUTANEOUS
  Filled 2022-09-08: qty 1

## 2022-09-08 MED ORDER — DARATUMUMAB-HYALURONIDASE-FIHJ 1800-30000 MG-UT/15ML ~~LOC~~ SOLN
1800.0000 mg | Freq: Once | SUBCUTANEOUS | Status: AC
  Administered 2022-09-08: 1800 mg via SUBCUTANEOUS
  Filled 2022-09-08: qty 15

## 2022-09-08 MED ORDER — DIPHENHYDRAMINE HCL 25 MG PO CAPS
50.0000 mg | ORAL_CAPSULE | Freq: Once | ORAL | Status: AC
  Administered 2022-09-08: 50 mg via ORAL
  Filled 2022-09-08: qty 2

## 2022-09-08 MED ORDER — MONTELUKAST SODIUM 10 MG PO TABS
10.0000 mg | ORAL_TABLET | Freq: Once | ORAL | Status: AC
  Administered 2022-09-08: 10 mg via ORAL
  Filled 2022-09-08: qty 1

## 2022-09-08 MED ORDER — DEXAMETHASONE 4 MG PO TABS
10.0000 mg | ORAL_TABLET | Freq: Once | ORAL | Status: AC
  Administered 2022-09-08: 10 mg via ORAL
  Filled 2022-09-08: qty 3

## 2022-09-08 MED ORDER — BORTEZOMIB CHEMO SQ INJECTION 3.5 MG (2.5MG/ML)
1.3000 mg/m2 | Freq: Once | INTRAMUSCULAR | Status: AC
  Administered 2022-09-08: 2.5 mg via SUBCUTANEOUS
  Filled 2022-09-08: qty 1

## 2022-09-08 MED ORDER — ACETAMINOPHEN 325 MG PO TABS
650.0000 mg | ORAL_TABLET | Freq: Once | ORAL | Status: AC
  Administered 2022-09-08: 650 mg via ORAL
  Filled 2022-09-08: qty 2

## 2022-09-08 NOTE — Progress Notes (Signed)
Hematology/Oncology Consult note [facility]  [phone] [phone]  Patient Care Team: Marisue Ivan, MD as PCP - General (Family Medicine) Pa, [facility] (Optometry) Creig Hines, MD as Consulting Physician (Oncology)   Name of the patient: Nicholas Weiss  [id number]  [birth year]   Date of visit: 09/08/22  Diagnosis- high risk IgG lambda multiple myeloma R-ISS stage III     Chief complaint/ Reason for visit-on treatment assessment prior to cycle 1 day 15 of D VRD chemotherapy  Heme/Onc history: Patient is a 72 year old African-American male with a past medical history significant for uncontrolled type 1 diabetes, chronic kidney disease stage III chronic normocytic anemia referred for abnormal SPEP. Patient's creatinine has been fluctuating between 1.5-2 but about 5 months ago it went up all the way to 5 and his blood sugars were in the 1000 range. More recently his kidney numbers have been drifting back to normal values. As a part of the work-up he had serum protein electrophoresis done which showed an elevated gammaglobulin fraction with an M spike of 3%. The amount of M spike was not quantified in the specimen. He has therefore been referred to [place] for further management. Patient endorses chronic fatigue reports that his appetite and weight have remained stable   Results of blood work from 04/08/2020 were as follows: CBC showed white count of 2.9, H&H of 10.5/31.2 with an MCV of 91 and a platelet count of 191.  CMP showed a mildly elevated creatinine of 1.2 which was better as compared to 5 months ago when it was 3.9.  Total protein was mildly elevated at 9.1 calcium normal at 8.4 ferritin and iron studies B12 folate TSH and haptoglobin were normal.  Myeloma panel revealed an elevated IgG level of 06/04/2004 with an M protein of 3.1 g.  Immunofixation showed IgG lambda specificity.  Serum free light chain ratio was elevated at 25 and  free light chain lambda elevated at 370   Further myeloma work-up including a PET CT scan did not reveal any evidence of edematous lesions.  Bone marrow biopsy showed 23% plasma cells by manual count and 30% by CD138 IHC.  Normal cytogenetics.  FISH studies for myeloma showed gain of 1 q.  13 q-. detected.  P53 not detected.   He was treated for a year and has having smoldering multiple myeloma since both CKD and anemia have been stable for 3 to 4 years and could be secondary to uncontrolled diabetes.In June 2023 IgG levels increased to 5463 with an M protein of 4.1 g as compared to 2.6 g in September 2022.  Serum free light chain ratio remains around 25.  Therefore a repeat bone marrow biopsy was done which shows further increase in plasma cells ranging from 30 to 70% and by immunohistochemistry 60 to 70% of the cells were positive for CD138.  Cytogenetics normal.  FISH study showed 4: 14 translocation and gain of 1 q. making this high risk RISS stage III.  Repeat PET scan showed no lytic lesions   Patient is not a transplant candidate and was started on Velcade Revlimid dexamethasone regimen in July 2023.  Treatment complicated by repeated hospitalizations for uncontrolled blood sugars.  He has been frequently hospitalized thereby causing multiple interruptions in his treatment  Interval history-states that he is not using any cocaine presently.  No recent falls.  States that his blood sugars have been under better control with continuous glucose monitoring.  Reports occasional lightheadedness  when he stands up and fatigue with ambulation  ECOG PS- 2 Pain scale- 0   Review of systems- Review of Systems  Constitutional:  Positive for malaise/fatigue. Negative for chills, fever and weight loss.  HENT:  Negative for congestion, ear discharge and nosebleeds.   Eyes:  Negative for blurred vision.  Respiratory:  Negative for cough, hemoptysis, sputum production, shortness of breath and wheezing.    Cardiovascular:  Negative for chest pain, palpitations, orthopnea and claudication.  Gastrointestinal:  Negative for abdominal pain, blood in stool, constipation, diarrhea, heartburn, melena, nausea and vomiting.  Genitourinary:  Negative for dysuria, flank pain, frequency, hematuria and urgency.  Musculoskeletal:  Negative for back pain, joint pain and myalgias.  Skin:  Negative for rash.  Neurological:  Negative for dizziness, tingling, focal weakness, seizures, weakness and headaches.  Endo/Heme/Allergies:  Does not bruise/bleed easily.  Psychiatric/Behavioral:  Negative for depression and suicidal ideas. The patient does not have insomnia.       Allergies  Allergen Reactions   Penicillins Other (See Comments)    Childhood allergy -tolerated amoxil 03/2022 Unknown reaction Has patient had a PCN reaction causing immediate rash, facial/tongue/throat swelling, SOB or lightheadedness with hypotension: No Has patient had a PCN reaction causing severe rash involving mucus membranes or skin necrosis: No Has patient had a PCN reaction that required hospitalization No Has patient had a PCN reaction occurring within the last 10 years: No If all of the above answers are "NO", then may proceed with Cephalosporin use.      Past Medical History:  Diagnosis Date   Acute metabolic encephalopathy 12/02/2020   DKA (diabetic ketoacidoses) 04/06/2016   Hypercholesteremia    Hypertension      Past Surgical History:  Procedure Laterality Date   AMPUTATION TOE Left 04/25/2022   Procedure: LEFT GREAT TOE AMPUTATION AND LEFT PARTIAL 2ND TOE AMPUTATION;  Surgeon: Felecia Shelling, DPM;  Location: [facility] ORS;  Service: Podiatry;  Laterality: Left;   COLONOSCOPY WITH PROPOFOL N/A 02/01/2020   Procedure: COLONOSCOPY WITH PROPOFOL;  Surgeon: Toney Reil, MD;  Location: [facility] ENDOSCOPY;  Service: Gastroenterology;  Laterality: N/A;   ESOPHAGOGASTRODUODENOSCOPY  02/01/2020   Procedure:  ESOPHAGOGASTRODUODENOSCOPY (EGD);  Surgeon: Toney Reil, MD;  Location: [facility] ENDOSCOPY;  Service: Gastroenterology;;   ESOPHAGOGASTRODUODENOSCOPY (EGD) WITH PROPOFOL N/A 06/07/2022   Procedure: ESOPHAGOGASTRODUODENOSCOPY (EGD) WITH PROPOFOL;  Surgeon: Kerin Salen, MD;  Location: [facility] ENDOSCOPY;  Service: Gastroenterology;  Laterality: N/A;   KNEE SURGERY Right    Torn meniscus   KNEE SURGERY Left     Social History   Socioeconomic History   Marital status: Single    Spouse name: Not on file   Number of children: 3   Years of education: Not on file   Highest education level: High school graduate  Occupational History    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  Tobacco Use   Smoking status: Never   Smokeless tobacco: Never  Vaping Use   Vaping Use: Never used  Substance and Sexual Activity   Alcohol use: Not Currently    Alcohol/week: 0.0 - 1.0 standard drinks of alcohol    Comment: "once every 2 months"   Drug use: Yes    Types: Marijuana, "Crack" cocaine    Comment: last week   Sexual activity: Not Currently    Birth control/protection: None  Other Topics Concern   Not on file  Social History Narrative   Lives with girlfriend "sharon"   Social Determinants of Corporate investment banker  Strain: Low Risk  (01/01/2020)   Overall Financial Resource Strain (CARDIA)    Difficulty of Paying Living Expenses: Not hard at all  Food Insecurity: No Food Insecurity (08/19/2022)   Hunger Vital Sign    Worried About Running Out of Food in the Last Year: Never true    Ran Out of Food in the Last Year: Never true  Transportation Needs: No Transportation Needs (08/19/2022)   PRAPARE - Administrator, Civil Service (Medical): No    Lack of Transportation (Non-Medical): No  Physical Activity: Insufficiently Active (01/01/2020)   Exercise Vital Sign    Days of Exercise per Week: 7 days    Minutes of Exercise per Session: 20 min  Stress: No Stress Concern Present (01/01/2020)    [employer] of Occupational Health - Occupational Stress Questionnaire    Feeling of Stress : Not at all  Social Connections: Moderately Isolated (01/01/2020)   Social Connection and Isolation Panel [NHANES]    Frequency of Communication with Friends and Family: More than three times a week    Frequency of Social Gatherings with Friends and Family: More than three times a week    Attends Religious Services: Never    Database administrator or Organizations: No    Attends Banker Meetings: Never    Marital Status: Living with partner  Intimate Partner Violence: Not At Risk (08/19/2022)   Humiliation, Afraid, Rape, and Kick questionnaire    Fear of Current or Ex-Partner: No    Emotionally Abused: No    Physically Abused: No    Sexually Abused: No    Family History  Problem Relation Age of Onset   Heart attack Father    Hypertension Sister    Cancer Sister      Current Outpatient Medications:    acetaminophen (TYLENOL) 500 MG tablet, Take 1,000 mg by mouth daily as needed for mild pain or moderate pain., Disp: , Rfl:    acyclovir (ZOVIRAX) 400 MG tablet, Take 1 tablet (400 mg total) by mouth 2 (two) times daily., Disp: 60 tablet, Rfl: 2   ascorbic acid (VITAMIN C) 500 MG tablet, Take 1 tablet (500 mg total) by mouth daily., Disp: 90 tablet, Rfl: 0   aspirin EC 81 MG tablet, Take 81 mg by mouth in the morning., Disp: , Rfl:    atorvastatin (LIPITOR) 80 MG tablet, Take 1 tablet (80 mg total) by mouth at bedtime. Hold while taking Paxlovid, Disp: 30 tablet, Rfl: 2   Baclofen 5 MG TABS, Take 5 mg by mouth daily as needed (muscle spasm)., Disp: , Rfl:    clopidogrel (PLAVIX) 75 MG tablet, Take 75 mg by mouth in the morning., Disp: , Rfl:    Continuous Glucose Sensor (DEXCOM G7 SENSOR) MISC, Use 1 each every 10 (ten) days, Disp: , Rfl:    docusate sodium (COLACE) 100 MG capsule, Take 100 mg by mouth daily as needed for mild constipation., Disp: , Rfl:    feeding supplement,  GLUCERNA SHAKE, (GLUCERNA SHAKE) LIQD, Take 237 mLs by mouth 3 (three) times daily between meals., Disp: , Rfl: 0   Glucagon (BAQSIMI ONE PACK) 3 MG/DOSE POWD, Place 3 mg into the nose once as needed for up to 1 dose (for low sugar)., Disp: 1 each, Rfl: 1   insulin degludec (TRESIBA FLEXTOUCH) 100 UNIT/ML FlexTouch Pen, Inject 8 Units into the skin daily., Disp: , Rfl:    lenalidomide (REVLIMID) 10 MG capsule, Take 1 capsule (10 mg total)  by mouth daily. Take for 14 days, then hold for 7 days. Repeat every 21 days., Disp: 14 capsule, Rfl: 0   lisinopril (ZESTRIL) 2.5 MG tablet, Take 2.5 mg by mouth daily., Disp: , Rfl:    Multiple Vitamin (MULTIVITAMIN WITH MINERALS) TABS tablet, Take 1 tablet by mouth daily., Disp: 90 tablet, Rfl: 1   NOVOLOG 100 UNIT/ML injection, Sliding Scale: Glucose 70 - 120: 0 units  Glucose 121 - 150: 1 unit  Glucose 151 - 200: 2 units Glucose 201 - 250: 3 units  Glucose 251 - 300: 5 units  Glucose 301 - 350: 7 units  Glucose 351 - 400: 9 units  Glucose > 400: call your docotr, Disp: 10 mL, Rfl: 1   ondansetron (ZOFRAN) 8 MG tablet, Take 8 mg by mouth daily as needed for nausea or vomiting., Disp: , Rfl:    pantoprazole (PROTONIX) 40 MG tablet, Take 1 tablet (40 mg total) by mouth 2 (two) times daily. Take twice daily for 2 months then decrease to once daily, Disp: , Rfl:  No current facility-administered medications for this visit.  Facility-Administered Medications Ordered in Other Visits:    epoetin alfa-epbx (RETACRIT) injection 40,000 Units, 40,000 Units, Subcutaneous, Q21 days, Creig Hines, MD, 40,000 Units at 09/08/22 1232  Physical exam:  Vitals:   09/08/22 1101  BP: 119/76  Pulse: 89  Temp: 97.9 F (36.6 C)  TempSrc: Tympanic  SpO2: 100%  Weight: 155 lb (70.3 kg)   Physical Exam Cardiovascular:     Rate and Rhythm: Normal rate and regular rhythm.     Heart sounds: Normal heart sounds.  Pulmonary:     Effort: Pulmonary effort is normal.     Breath  sounds: Normal breath sounds.  Abdominal:     General: Bowel sounds are normal.     Palpations: Abdomen is soft.  Musculoskeletal:     Right lower leg: No edema.     Left lower leg: No edema.  Skin:    General: Skin is warm and dry.  Neurological:     Mental Status: He is alert and oriented to person, place, and time.         Latest Ref Rng & Units 09/08/2022   10:49 AM  CMP  Glucose 70 - 99 mg/dL 161   BUN 8 - 23 mg/dL 49   Creatinine 0.96 - 1.24 mg/dL 0.45   Sodium 409 - 811 mmol/L 131   Potassium 3.5 - 5.1 mmol/L 4.6   Chloride 98 - 111 mmol/L 100   CO2 22 - 32 mmol/L 25   Calcium 8.9 - 10.3 mg/dL 8.5   Total Protein 6.5 - 8.1 g/dL 8.4   Total Bilirubin 0.3 - 1.2 mg/dL 0.4   Alkaline Phos 38 - 126 U/L 49   AST 15 - 41 U/L 33   ALT 0 - 44 U/L 34       Latest Ref Rng & Units 09/08/2022   10:49 AM  CBC  WBC 4.0 - 10.5 K/uL 4.3   Hemoglobin 13.0 - 17.0 g/dL 8.0   Hematocrit 91.4 - 52.0 % 24.9   Platelets 150 - 400 K/uL 263     No images are attached to the encounter.  ECHOCARDIOGRAM COMPLETE  Result Date: 08/19/2022    ECHOCARDIOGRAM REPORT   Patient Name:   Nicholas Weiss Date of Exam: 08/19/2022 Medical Rec #:  [id number]       Height:       71.0 in Accession #:  [id number]      Weight:       155.0 lb Date of Birth:  December 14, 1950      BSA:          1.892 m Patient Age:    3 years        BP:           145/93 mmHg Patient Gender: M               HR:           78 bpm. Exam Location:  [facility] Procedure: 2D Echo, Cardiac Doppler and Color Doppler Indications:     Syncope  History:         Patient has prior history of Echocardiogram examinations, most                  recent 04/24/2022. Stroke, Signs/Symptoms:Syncope,                  Dizziness/Lightheadedness and Bacteremia; Risk                  Factors:Hypertension, Diabetes and Dyslipidemia. CKD, Cocaine                  abuse.  Sonographer:     Mikki Harbor Referring Phys:  [id number] [facility] GOEL Diagnosing Phys: Yvonne Kendall MD IMPRESSIONS  1. Left ventricular ejection fraction, by estimation, is 60 to 65%. The left ventricle has normal function. The left ventricle has no regional wall motion abnormalities. There is moderate asymmetric left ventricular hypertrophy of the basal-septal segment. Left ventricular diastolic parameters were normal.  2. Right ventricular systolic function is normal. The right ventricular size is normal. There is normal pulmonary artery systolic pressure.  3. Left atrial size was mildly dilated.  4. A small pericardial effusion is present. The pericardial effusion is anterior to the right ventricle. There is no evidence of cardiac tamponade.  5. The mitral valve is degenerative. Trivial mitral valve regurgitation. No evidence of mitral stenosis.  6. The aortic valve is tricuspid. There is mild thickening of the aortic valve. Aortic valve regurgitation is not visualized. Aortic valve sclerosis is present, with no evidence of aortic valve stenosis.  7. The inferior vena cava is normal in size with greater than 50% respiratory variability, suggesting right atrial pressure of 3 mmHg. FINDINGS  Left Ventricle: Left ventricular ejection fraction, by estimation, is 60 to 65%. The left ventricle has normal function. The left ventricle has no regional wall motion abnormalities. The left ventricular internal cavity size was normal in size. There is  moderate asymmetric left ventricular hypertrophy of the basal-septal segment. Left ventricular diastolic parameters were normal. Right Ventricle: The right ventricular size is normal. No increase in right ventricular wall thickness. Right ventricular systolic function is normal. There is normal pulmonary artery systolic pressure. The tricuspid regurgitant velocity is 2.15 m/s, and  with an assumed right atrial pressure of 3 mmHg, the estimated right ventricular systolic pressure is 21.5 mmHg. Left Atrium: Left atrial size was mildly dilated. Right Atrium: Right atrial  size was normal in size. Pericardium: A small pericardial effusion is present. The pericardial effusion is anterior to the right ventricle. There is no evidence of cardiac tamponade. Mitral Valve: The mitral valve is degenerative in appearance. There is mild thickening of the mitral valve leaflet(s). Trivial mitral valve regurgitation. No evidence of mitral valve stenosis. MV peak gradient, 6.9 mmHg. The mean mitral valve gradient is  2.0 mmHg. Tricuspid Valve:  The tricuspid valve is normal in structure. Tricuspid valve regurgitation is mild. Aortic Valve: The aortic valve is tricuspid. There is mild thickening of the aortic valve. Aortic valve regurgitation is not visualized. Aortic valve sclerosis is present, with no evidence of aortic valve stenosis. Aortic valve mean gradient measures 2.0  mmHg. Aortic valve peak gradient measures 3.0 mmHg. Aortic valve area, by VTI measures 2.89 cm. Pulmonic Valve: The pulmonic valve was normal in structure. Pulmonic valve regurgitation is not visualized. No evidence of pulmonic stenosis. Aorta: The aortic root is normal in size and structure. Pulmonary Artery: The pulmonary artery is not well seen. Venous: The inferior vena cava is normal in size with greater than 50% respiratory variability, suggesting right atrial pressure of 3 mmHg. IAS/Shunts: No atrial level shunt detected by color flow Doppler.  LEFT VENTRICLE PLAX 2D LVIDd:         4.60 cm   Diastology LVIDs:         2.70 cm   LV e' medial:    14.90 cm/s LV PW:         1.00 cm   LV E/e' medial:  6.6 LV IVS:        1.45 cm   LV e' lateral:   24.50 cm/s LVOT diam:     2.10 cm   LV E/e' lateral: 4.0 LV SV:         57 LV SV Index:   30 LVOT Area:     3.46 cm  RIGHT VENTRICLE RV Basal diam:  3.45 cm RV Mid diam:    2.90 cm RV S prime:     10.60 cm/s TAPSE (M-mode): 2.2 cm LEFT ATRIUM             Index        RIGHT ATRIUM           Index LA diam:        3.90 cm 2.06 cm/m   RA Area:     17.00 cm LA Vol (A2C):   92.9 ml  49.10 ml/m  RA Volume:   44.70 ml  23.62 ml/m LA Vol (A4C):   56.0 ml 29.60 ml/m LA Biplane Vol: 74.2 ml 39.22 ml/m  AORTIC VALVE                    PULMONIC VALVE AV Area (Vmax):    3.09 cm     PV Vmax:       0.80 m/s AV Area (Vmean):   2.83 cm     PV Peak grad:  2.6 mmHg AV Area (VTI):     2.89 cm AV Vmax:           86.70 cm/s AV Vmean:          59.800 cm/s AV VTI:            0.199 m AV Peak Grad:      3.0 mmHg AV Mean Grad:      2.0 mmHg LVOT Vmax:         77.40 cm/s LVOT Vmean:        48.800 cm/s LVOT VTI:          0.166 m LVOT/AV VTI ratio: 0.83  AORTA Ao Root diam: 3.60 cm MITRAL VALVE               TRICUSPID VALVE MV Area (PHT): 5.79 cm    TR Peak grad:   18.5 mmHg MV Area VTI:  2.48 cm    TR Vmax:        215.00 cm/s MV Peak grad:  6.9 mmHg MV Mean grad:  2.0 mmHg    SHUNTS MV Vmax:       1.31 m/s    Systemic VTI:  0.17 m MV Vmean:      64.7 cm/s   Systemic Diam: 2.10 cm MV Decel Time: 131 msec MV E velocity: 98.50 cm/s MV A velocity: 50.80 cm/s MV E/A ratio:  1.94 Yvonne Kendall MD Electronically signed by Yvonne Kendall MD Signature Date/Time: 08/19/2022/5:42:01 PM    Final    DG Chest 2 View  Result Date: 08/18/2022 CLINICAL DATA:  Dizziness and syncope. EXAM: CHEST - 2 VIEW COMPARISON:  Chest radiograph dated 07/09/2022. FINDINGS: No focal consolidation, pleural effusion, or pneumothorax. The cardiac silhouette is within normal limits. Atherosclerotic calcification of the aorta. No acute osseous pathology. IMPRESSION: No active cardiopulmonary disease. Electronically Signed   By: Elgie Collard M.D.   On: 08/18/2022 23:44     Assessment and plan- Patient is a 72 y.o. male with R-ISS stage III high risk IgG lambda multiple myeloma.  He is here for on treatment assessment prior to cycle 1 day 15 of the RVD chemotherapy  Patient was previously on VRD regimen alone.  Due to repeated hospitalization and inconsistency in giving this regimen he did not achieve a good response and therefore  daratumumab has been added.  This is his third weekly dose of daratumumab.  I will see him in 1 week for cycle 2-day one of the VRD chemotherapy.  He will restart his Revlimid next week.  If patient achieves a stable response with the D VRD regimen I will eventually plan to drop daratumumab altogether and continue with Velcade and Revlimid alone.  Myeloma panel and serum free light chains from today is pending  Anemia of chronic kidney disease: He will receive Retacrit today and we will continue that every 3 weeks  Patient will continue with aspirin and acyclovir prophylaxis.  He does not have any known areas of bone involvement and he is not on any bisphosphonates presently.   Visit Diagnosis 1. Multiple myeloma not having achieved remission (HCC)   2. Encounter for antineoplastic chemotherapy   3. High risk medication use   4. Erythropoietin (EPO) stimulating agent anemia management patient      Dr. Owens Shark, MD, MPH [facility] [id number] 09/08/2022 1:13 PM

## 2022-09-08 NOTE — Patient Instructions (Signed)
[facility]  Discharge Instructions: Thank you for choosing [facility] to provide your oncology and hematology care.  If you have a lab appointment with the Cancer Center, please go directly to the Cancer Center and check in at the registration area.  Wear comfortable clothing and clothing appropriate for easy access to any Portacath or PICC line.   We strive to give you quality time with your provider. You may need to reschedule your appointment if you arrive late (15 or more minutes).  Arriving late affects you and other patients whose appointments are after yours.  Also, if you miss three or more appointments without notifying the office, you may be dismissed from the clinic at the provider's discretion.      For prescription refill requests, have your pharmacy contact our office and allow 72 hours for refills to be completed.    Today you received the following chemotherapy and/or immunotherapy agents DARZALEX, VELCADE, RETACRIT      To help prevent nausea and vomiting after your treatment, we encourage you to take your nausea medication as directed.  BELOW ARE SYMPTOMS THAT SHOULD BE REPORTED IMMEDIATELY: *FEVER GREATER THAN 100.4 F (38 C) OR HIGHER *CHILLS OR SWEATING *NAUSEA AND VOMITING THAT IS NOT CONTROLLED WITH YOUR NAUSEA MEDICATION *UNUSUAL SHORTNESS OF BREATH *UNUSUAL BRUISING OR BLEEDING *URINARY PROBLEMS (pain or burning when urinating, or frequent urination) *BOWEL PROBLEMS (unusual diarrhea, constipation, pain near the anus) TENDERNESS IN MOUTH AND THROAT WITH OR WITHOUT PRESENCE OF ULCERS (sore throat, sores in mouth, or a toothache) UNUSUAL RASH, SWELLING OR PAIN  UNUSUAL VAGINAL DISCHARGE OR ITCHING   Items with * indicate a potential emergency and should be followed up as soon as possible or go to the Emergency Department if any problems should occur.  Please show the CHEMOTHERAPY ALERT CARD or IMMUNOTHERAPY ALERT  CARD at check-in to the Emergency Department and triage nurse.  Should you have questions after your visit or need to cancel or reschedule your appointment, please contact [place] [facility]  [phone] and follow the prompts.  Office hours are 8:00 a.m. to 4:30 p.m. Monday - Friday. Please note that voicemails left after 4:00 p.m. may not be returned until the following business day.  We are closed weekends and major holidays. You have access to a nurse at all times for urgent questions. Please call the main number to the clinic [phone] and follow the prompts.  For any non-urgent questions, you may also contact your provider using MyChart. We now offer e-Visits for anyone 103 and older to request care online for non-urgent symptoms. For details visit [email or web address].   Also download the MyChart app! Go to the app store, search "MyChart", open the app, select [place], and log in with your MyChart username and password.  Daratumumab Injection What is this medication? DARATUMUMAB (dar a toom ue mab) treats multiple myeloma, a type of bone marrow cancer. It works by helping your immune system slow or stop the spread of cancer cells. It is a monoclonal antibody. This medicine may be used for other purposes; ask your health care provider or pharmacist if you have questions. COMMON BRAND NAME(S): DARZALEX What should I tell my care team before I take this medication? They need to know if you have any of these conditions: Hereditary fructose intolerance Infection, such as chickenpox, herpes, hepatitis B Lung or breathing disease, such as asthma, COPD An unusual or allergic reaction to daratumumab,  sorbitol, other medications, foods, dyes, or preservatives Pregnant or trying to get pregnant Breastfeeding How should I use this medication? This medication is injected into a vein. It is given by your care team in a hospital or clinic setting. Talk to your care  team about the use of this medication in children. Special care may be needed. Overdosage: If you think you have taken too much of this medicine contact a poison control center or emergency room at once. NOTE: This medicine is only for you. Do not share this medicine with others. What if I miss a dose? Keep appointments for follow-up doses. It is important not to miss your dose. Call your care team if you are unable to keep an appointment. What may interact with this medication? Interactions have not been studied. This list may not describe all possible interactions. Give your health care provider a list of all the medicines, herbs, non-prescription drugs, or dietary supplements you use. Also tell them if you smoke, drink alcohol, or use illegal drugs. Some items may interact with your medicine. What should I watch for while using this medication? Your condition will be monitored carefully while you are receiving this medication. This medication can cause serious allergic reactions. To reduce your risk, your care team may give you other medication to take before receiving this one. Be sure to follow the directions from your care team. This medication can affect the results of blood tests to match your blood type. These changes can last for up to 6 months after the final dose. Your care team will do blood tests to match your blood type before you start treatment. Tell all of your care team that you are being treated with this medication before receiving a blood transfusion. This medication can affect the results of some tests used to determine treatment response; extra tests may be needed to evaluate response. Talk to your care team if you wish to become pregnant or think you are pregnant. This medication can cause serious birth defects if taken during pregnancy and for 3 months after the last dose. A reliable form of contraception is recommended while taking this medication and for 3 months after the  last dose. Talk to your care team about effective forms of contraception. Do not breast-feed while taking this medication. What side effects may I notice from receiving this medication? Side effects that you should report to your care team as soon as possible: Allergic reactions--skin rash, itching, hives, swelling of the face, lips, tongue, or throat Infection--fever, chills, cough, sore throat, wounds that don't heal, pain or trouble when passing urine, general feeling of discomfort or being unwell Infusion reactions--chest pain, shortness of breath or trouble breathing, feeling faint or lightheaded Unusual bruising or bleeding Side effects that usually do not require medical attention (report to your care team if they continue or are bothersome): Constipation Diarrhea Fatigue Nausea Pain, tingling, or numbness in the hands or feet Swelling of the ankles, hands, or feet This list may not describe all possible side effects. Call your doctor for medical advice about side effects. You may report side effects to FDA at 1-800-FDA-1088. Where should I keep my medication? This medication is given in a hospital or clinic. It will not be stored at home. NOTE: This sheet is a summary. It may not cover all possible information. If you have questions about this medicine, talk to your doctor, pharmacist, or health care provider.  2024 Elsevier/Gold Standard (2022-01-22 00:00:00)  Bortezomib Injection What is  this medication? BORTEZOMIB (bor TEZ oh mib) treats lymphoma. It may also be used to treat multiple myeloma, a type of bone marrow cancer. It works by blocking a protein that causes cancer cells to grow and multiply. This helps to slow or stop the spread of cancer cells. This medicine may be used for other purposes; ask your health care provider or pharmacist if you have questions. COMMON BRAND NAME(S): Velcade What should I tell my care team before I take this medication? They need to know if  you have any of these conditions: Dehydration Diabetes Heart disease Liver disease Tingling of the fingers or toes or other nerve disorder An unusual or allergic reaction to bortezomib, other medications, foods, dyes, or preservatives If you or your partner are pregnant or trying to get pregnant Breastfeeding How should I use this medication? This medication is injected into a vein or under the skin. It is given by your care team in a hospital or clinic setting. Talk to your care team about the use of this medication in children. Special care may be needed. Overdosage: If you think you have taken too much of this medicine contact a poison control center or emergency room at once. NOTE: This medicine is only for you. Do not share this medicine with others. What if I miss a dose? Keep appointments for follow-up doses. It is important not to miss your dose. Call your care team if you are unable to keep an appointment. What may interact with this medication? Ketoconazole Rifampin This list may not describe all possible interactions. Give your health care provider a list of all the medicines, herbs, non-prescription drugs, or dietary supplements you use. Also tell them if you smoke, drink alcohol, or use illegal drugs. Some items may interact with your medicine. What should I watch for while using this medication? Your condition will be monitored carefully while you are receiving this medication. You may need blood work while taking this medication. This medication may affect your coordination, reaction time, or judgment. Do not drive or operate machinery until you know how this medication affects you. Sit up or stand slowly to reduce the risk of dizzy or fainting spells. Drinking alcohol with this medication can increase the risk of these side effects. This medication may increase your risk of getting an infection. Call your care team for advice if you get a fever, chills, sore throat, or other  symptoms of a cold or flu. Do not treat yourself. Try to avoid being around people who are sick. Check with your care team if you have severe diarrhea, nausea, and vomiting, or if you sweat a lot. The loss of too much body fluid may make it dangerous for you to take this medication. Talk to your care team if you may be pregnant. Serious birth defects can occur if you take this medication during pregnancy and for 7 months after the last dose. You will need a negative pregnancy test before starting this medication. Contraception is recommended while taking this medication and for 7 months after the last dose. Your care team can help you find the option that works for you. If your partner can get pregnant, use a condom during sex while taking this medication and for 4 months after the last dose. Do not breastfeed while taking this medication and for 2 months after the last dose. This medication may cause infertility. Talk to your care team if you are concerned about your fertility. What side effects may I notice  from receiving this medication? Side effects that you should report to your care team as soon as possible: Allergic reactions--skin rash, itching, hives, swelling of the face, lips, tongue, or throat Bleeding--bloody or black, tar-like stools, vomiting blood or brown material that looks like coffee grounds, red or dark brown urine, small red or purple spots on skin, unusual bruising or bleeding Bleeding in the brain--severe headache, stiff neck, confusion, dizziness, change in vision, numbness or weakness of the face, arm, or leg, trouble speaking, trouble walking, vomiting Bowel blockage--stomach cramping, unable to have a bowel movement or pass gas, loss of appetite, vomiting Heart failure--shortness of breath, swelling of the ankles, feet, or hands, sudden weight gain, unusual weakness or fatigue Infection--fever, chills, cough, sore throat, wounds that don't heal, pain or trouble when passing  urine, general feeling of discomfort or being unwell Liver injury--right upper belly pain, loss of appetite, nausea, light-colored stool, dark yellow or brown urine, yellowing skin or eyes, unusual weakness or fatigue Low blood pressure--dizziness, feeling faint or lightheaded, blurry vision Lung injury--shortness of breath or trouble breathing, cough, spitting up blood, chest pain, fever Pain, tingling, or numbness in the hands or feet Severe or prolonged diarrhea Stomach pain, bloody diarrhea, pale skin, unusual weakness or fatigue, decrease in the amount of urine, which may be signs of hemolytic uremic syndrome Sudden and severe headache, confusion, change in vision, seizures, which may be signs of posterior reversible encephalopathy syndrome (PRES) TTP--purple spots on the skin or inside the mouth, pale skin, yellowing skin or eyes, unusual weakness or fatigue, fever, fast or irregular heartbeat, confusion, change in vision, trouble speaking, trouble walking Tumor lysis syndrome (TLS)--nausea, vomiting, diarrhea, decrease in the amount of urine, dark urine, unusual weakness or fatigue, confusion, muscle pain or cramps, fast or irregular heartbeat, joint pain Side effects that usually do not require medical attention (report to your care team if they continue or are bothersome): Constipation Diarrhea Fatigue Loss of appetite Nausea This list may not describe all possible side effects. Call your doctor for medical advice about side effects. You may report side effects to FDA at 1-800-FDA-1088. Where should I keep my medication? This medication is given in a hospital or clinic. It will not be stored at home. NOTE: This sheet is a summary. It may not cover all possible information. If you have questions about this medicine, talk to your doctor, pharmacist, or health care provider.  2024 Elsevier/Gold Standard (2021-08-19 00:00:00)  Epoetin Alfa Injection What is this medication? EPOETIN ALFA  (e POE e tin AL fa) treats low levels of red blood cells (anemia) caused by kidney disease, chemotherapy, or HIV medications. It can also be used in people who are at risk for blood loss during surgery. It works by Systems analyst make more red blood cells, which reduces the need for blood transfusions. This medicine may be used for other purposes; ask your health care provider or pharmacist if you have questions. COMMON BRAND NAME(S): Epogen, Procrit, Retacrit What should I tell my care team before I take this medication? They need to know if you have any of these conditions: Blood clots Cancer Heart disease High blood pressure On dialysis Seizures Stroke An unusual or allergic reaction to epoetin alfa, albumin, benzyl alcohol, other medications, foods, dyes, or preservatives Pregnant or trying to get pregnant Breast-feeding How should I use this medication? This medication is injected into a vein or under the skin. It is usually given by your care team in a hospital or  clinic setting. It may also be given at home. If you get this medication at home, you will be taught how to prepare and give it. Use exactly as directed. Take it as directed on the prescription label at the same time every day. Keep taking it unless your care team tells you to stop. It is important that you put your used needles and syringes in a special sharps container. Do not put them in a trash can. If you do not have a sharps container, call your pharmacist or care team to get one. A special MedGuide will be given to you by the pharmacist with each prescription and refill. Be sure to read this information carefully each time. Talk to your care team about the use of this medication in children. While this medication may be used in children as young as 1 month of age for selected conditions, precautions do apply. Overdosage: If you think you have taken too much of this medicine contact a poison control center or emergency  room at once. NOTE: This medicine is only for you. Do not share this medicine with others. What if I miss a dose? If you miss a dose, take it as soon as you can. If it is almost time for your next dose, take only that dose. Do not take double or extra doses. What may interact with this medication? Darbepoetin alfa Methoxy polyethylene glycol-epoetin beta This list may not describe all possible interactions. Give your health care provider a list of all the medicines, herbs, non-prescription drugs, or dietary supplements you use. Also tell them if you smoke, drink alcohol, or use illegal drugs. Some items may interact with your medicine. What should I watch for while using this medication? Visit your care team for regular checks on your progress. Check your blood pressure as directed. Know what your blood pressure should be and when to contact your care team. Your condition will be monitored carefully while you are receiving this medication. You may need blood work while taking this medication. What side effects may I notice from receiving this medication? Side effects that you should report to your care team as soon as possible: Allergic reactions--skin rash, itching, hives, swelling of the face, lips, tongue, or throat Blood clot--pain, swelling, or warmth in the leg, shortness of breath, chest pain Heart attack--pain or tightness in the chest, shoulders, arms, or jaw, nausea, shortness of breath, cold or clammy skin, feeling faint or lightheaded Increase in blood pressure Rash, fever, and swollen lymph nodes Redness, blistering, peeling, or loosening of the skin, including inside the mouth Seizures Stroke--sudden numbness or weakness of the face, arm, or leg, trouble speaking, confusion, trouble walking, loss of balance or coordination, dizziness, severe headache, change in vision Side effects that usually do not require medical attention (report to your care team if they continue or are  bothersome): Bone, joint, or muscle pain Cough Headache Nausea Pain, redness, or irritation at injection site This list may not describe all possible side effects. Call your doctor for medical advice about side effects. You may report side effects to FDA at 1-800-FDA-1088. Where should I keep my medication? Keep out of the reach of children and pets. Store in a refrigerator. Do not freeze. Do not shake. Protect from light. Keep this medication in the original container until you are ready to take it. See product for storage information. Get rid of any unused medication after the expiration date. To get rid of medications that are no longer needed  or have expired: Take the medication to a medication take-back program. Check with your pharmacy or law enforcement to find a location. If you cannot return the medication, ask your pharmacist or care team how to get rid of the medication safely. NOTE: This sheet is a summary. It may not cover all possible information. If you have questions about this medicine, talk to your doctor, pharmacist, or health care provider.  2024 Elsevier/Gold Standard (2021-07-18 00:00:00)

## 2022-09-09 ENCOUNTER — Other Ambulatory Visit: Payer: Self-pay

## 2022-09-10 LAB — KAPPA/LAMBDA LIGHT CHAINS
Kappa free light chain: 5.1 mg/L (ref 3.3–19.4)
Kappa, lambda light chain ratio: 0.06 — ABNORMAL LOW (ref 0.26–1.65)
Lambda free light chains: 92.1 mg/L — ABNORMAL HIGH (ref 5.7–26.3)

## 2022-09-14 LAB — MULTIPLE MYELOMA PANEL, SERUM
Albumin SerPl Elph-Mcnc: 3.3 g/dL (ref 2.9–4.4)
Albumin/Glob SerPl: 0.8 (ref 0.7–1.7)
Alpha 1: 0.2 g/dL (ref 0.0–0.4)
Alpha2 Glob SerPl Elph-Mcnc: 0.6 g/dL (ref 0.4–1.0)
B-Globulin SerPl Elph-Mcnc: 0.6 g/dL — ABNORMAL LOW (ref 0.7–1.3)
Gamma Glob SerPl Elph-Mcnc: 3.2 g/dL — ABNORMAL HIGH (ref 0.4–1.8)
Globulin, Total: 4.7 g/dL — ABNORMAL HIGH (ref 2.2–3.9)
IgA: 12 mg/dL — ABNORMAL LOW (ref 61–437)
IgG (Immunoglobin G), Serum: 3676 mg/dL — ABNORMAL HIGH (ref 603–1613)
IgM (Immunoglobulin M), Srm: <5 mg/dL — ABNORMAL LOW (ref 20–172)
M Protein SerPl Elph-Mcnc: 3.1 g/dL — ABNORMAL HIGH
Total Protein ELP: 8 g/dL (ref 6.0–8.5)

## 2022-09-15 ENCOUNTER — Telehealth: Payer: Self-pay | Admitting: *Deleted

## 2022-09-15 ENCOUNTER — Other Ambulatory Visit: Payer: 59

## 2022-09-15 ENCOUNTER — Ambulatory Visit: Payer: 59 | Admitting: Oncology

## 2022-09-15 ENCOUNTER — Ambulatory Visit: Payer: 59

## 2022-09-15 NOTE — Telephone Encounter (Signed)
patient called asking when he is to start his revlimid Your note from 6/11 just says that he will restart it next week. Does he start it today? He does not see you until 6/25.  Call returned to patient after speaking with Dr Smith Robert and he was advised to start taking Revlimid today.He repeated this back to me

## 2022-09-18 ENCOUNTER — Other Ambulatory Visit: Payer: Self-pay

## 2022-09-18 ENCOUNTER — Emergency Department: Payer: 59

## 2022-09-18 ENCOUNTER — Inpatient Hospital Stay
Admission: EM | Admit: 2022-09-18 | Discharge: 2022-09-20 | DRG: 682 | Disposition: A | Discharge status: Home / Self Care | Payer: 59 | Attending: Internal Medicine | Admitting: Internal Medicine

## 2022-09-18 ENCOUNTER — Encounter: Payer: Self-pay | Admitting: Internal Medicine

## 2022-09-18 DIAGNOSIS — C9000 Multiple myeloma not having achieved remission: Secondary | ICD-10-CM | POA: Diagnosis present

## 2022-09-18 DIAGNOSIS — R55 Syncope and collapse: Secondary | ICD-10-CM | POA: Diagnosis present

## 2022-09-18 DIAGNOSIS — E871 Hypo-osmolality and hyponatremia: Secondary | ICD-10-CM | POA: Diagnosis present

## 2022-09-18 DIAGNOSIS — Z8719 Personal history of other diseases of the digestive system: Secondary | ICD-10-CM

## 2022-09-18 DIAGNOSIS — Z8249 Family history of ischemic heart disease and other diseases of the circulatory system: Secondary | ICD-10-CM

## 2022-09-18 DIAGNOSIS — Z794 Long term (current) use of insulin: Secondary | ICD-10-CM

## 2022-09-18 DIAGNOSIS — E1065 Type 1 diabetes mellitus with hyperglycemia: Secondary | ICD-10-CM | POA: Diagnosis present

## 2022-09-18 DIAGNOSIS — F1910 Other psychoactive substance abuse, uncomplicated: Secondary | ICD-10-CM | POA: Insufficient documentation

## 2022-09-18 DIAGNOSIS — E860 Dehydration: Secondary | ICD-10-CM | POA: Diagnosis present

## 2022-09-18 DIAGNOSIS — D638 Anemia in other chronic diseases classified elsewhere: Secondary | ICD-10-CM | POA: Diagnosis present

## 2022-09-18 DIAGNOSIS — G9341 Metabolic encephalopathy: Secondary | ICD-10-CM | POA: Diagnosis present

## 2022-09-18 DIAGNOSIS — E10649 Type 1 diabetes mellitus with hypoglycemia without coma: Secondary | ICD-10-CM | POA: Diagnosis not present

## 2022-09-18 DIAGNOSIS — D649 Anemia, unspecified: Secondary | ICD-10-CM | POA: Diagnosis present

## 2022-09-18 DIAGNOSIS — I129 Hypertensive chronic kidney disease with stage 1 through stage 4 chronic kidney disease, or unspecified chronic kidney disease: Secondary | ICD-10-CM | POA: Diagnosis present

## 2022-09-18 DIAGNOSIS — N179 Acute kidney failure, unspecified: Secondary | ICD-10-CM | POA: Diagnosis not present

## 2022-09-18 DIAGNOSIS — E1069 Type 1 diabetes mellitus with other specified complication: Secondary | ICD-10-CM | POA: Diagnosis present

## 2022-09-18 DIAGNOSIS — Z8679 Personal history of other diseases of the circulatory system: Secondary | ICD-10-CM

## 2022-09-18 DIAGNOSIS — Z880 Allergy status to penicillin: Secondary | ICD-10-CM

## 2022-09-18 DIAGNOSIS — I959 Hypotension, unspecified: Secondary | ICD-10-CM | POA: Diagnosis present

## 2022-09-18 DIAGNOSIS — E7800 Pure hypercholesterolemia, unspecified: Secondary | ICD-10-CM | POA: Diagnosis present

## 2022-09-18 DIAGNOSIS — Z7902 Long term (current) use of antithrombotics/antiplatelets: Secondary | ICD-10-CM

## 2022-09-18 DIAGNOSIS — I951 Orthostatic hypotension: Secondary | ICD-10-CM | POA: Diagnosis present

## 2022-09-18 DIAGNOSIS — E1022 Type 1 diabetes mellitus with diabetic chronic kidney disease: Secondary | ICD-10-CM | POA: Diagnosis present

## 2022-09-18 DIAGNOSIS — R9431 Abnormal electrocardiogram [ECG] [EKG]: Secondary | ICD-10-CM | POA: Diagnosis present

## 2022-09-18 DIAGNOSIS — Z89422 Acquired absence of other left toe(s): Secondary | ICD-10-CM

## 2022-09-18 DIAGNOSIS — Z79899 Other long term (current) drug therapy: Secondary | ICD-10-CM

## 2022-09-18 DIAGNOSIS — N189 Chronic kidney disease, unspecified: Secondary | ICD-10-CM

## 2022-09-18 DIAGNOSIS — F1210 Cannabis abuse, uncomplicated: Secondary | ICD-10-CM | POA: Diagnosis present

## 2022-09-18 DIAGNOSIS — Z7982 Long term (current) use of aspirin: Secondary | ICD-10-CM

## 2022-09-18 LAB — URINALYSIS, ROUTINE W REFLEX MICROSCOPIC
Bacteria, UA: NONE SEEN
Bilirubin Urine: NEGATIVE
Glucose, UA: >=500 mg/dL — AB
Hgb urine dipstick: NEGATIVE
Ketones, ur: NEGATIVE mg/dL
Leukocytes,Ua: NEGATIVE
Nitrite: NEGATIVE
Protein, ur: NEGATIVE mg/dL
Specific Gravity, Urine: 1.016 (ref 1.005–1.030)
pH: 5 (ref 5.0–8.0)

## 2022-09-18 LAB — COMPREHENSIVE METABOLIC PANEL
ALT: 30 U/L (ref 0–44)
AST: 26 U/L (ref 15–41)
Albumin: 3.3 g/dL — ABNORMAL LOW (ref 3.5–5.0)
Alkaline Phosphatase: 49 U/L (ref 38–126)
Anion gap: 5 (ref 5–15)
BUN: 41 mg/dL — ABNORMAL HIGH (ref 8–23)
CO2: 22 mmol/L (ref 22–32)
Calcium: 7.7 mg/dL — ABNORMAL LOW (ref 8.9–10.3)
Chloride: 101 mmol/L (ref 98–111)
Creatinine, Ser: 1.91 mg/dL — ABNORMAL HIGH (ref 0.61–1.24)
GFR, Estimated: 37 mL/min — ABNORMAL LOW (ref >60)
Glucose, Bld: 242 mg/dL — ABNORMAL HIGH (ref 70–99)
Potassium: 4.6 mmol/L (ref 3.5–5.1)
Sodium: 128 mmol/L — ABNORMAL LOW (ref 135–145)
Total Bilirubin: 0.9 mg/dL (ref 0.3–1.2)
Total Protein: 7.8 g/dL (ref 6.5–8.1)

## 2022-09-18 LAB — CBC
HCT: 22.7 % — ABNORMAL LOW (ref 39.0–52.0)
Hemoglobin: 7.4 g/dL — ABNORMAL LOW (ref 13.0–17.0)
MCH: 31.1 pg (ref 26.0–34.0)
MCHC: 32.6 g/dL (ref 30.0–36.0)
MCV: 95.4 fL (ref 80.0–100.0)
Platelets: 207 10*3/uL (ref 150–400)
RBC: 2.38 MIL/uL — ABNORMAL LOW (ref 4.22–5.81)
RDW: 15.7 % — ABNORMAL HIGH (ref 11.5–15.5)
WBC: 2.6 10*3/uL — ABNORMAL LOW (ref 4.0–10.5)
nRBC: 0 % (ref 0.0–0.2)

## 2022-09-18 LAB — LACTIC ACID, PLASMA: Lactic Acid, Venous: 1.6 mmol/L (ref 0.5–1.9)

## 2022-09-18 LAB — URINE DRUG SCREEN, QUALITATIVE (ARMC ONLY)
Amphetamines, Ur Screen: NOT DETECTED
Barbiturates, Ur Screen: NOT DETECTED
Benzodiazepine, Ur Scrn: NOT DETECTED
Cannabinoid 50 Ng, Ur ~~LOC~~: POSITIVE — AB
Cocaine Metabolite,Ur ~~LOC~~: NOT DETECTED
MDMA (Ecstasy)Ur Screen: NOT DETECTED
Methadone Scn, Ur: NOT DETECTED
Opiate, Ur Screen: NOT DETECTED
Phencyclidine (PCP) Ur S: NOT DETECTED
Tricyclic, Ur Screen: NOT DETECTED

## 2022-09-18 LAB — LIPASE, BLOOD: Lipase: 28 U/L (ref 11–51)

## 2022-09-18 LAB — BLOOD GAS, VENOUS
Acid-Base Excess: 0.8 mmol/L (ref 0.0–2.0)
Bicarbonate: 26 mmol/L (ref 20.0–28.0)
O2 Saturation: 85 %
Patient temperature: 37
pCO2, Ven: 43 mmHg — ABNORMAL LOW (ref 44–60)
pH, Ven: 7.39 (ref 7.25–7.43)
pO2, Ven: 54 mmHg — ABNORMAL HIGH (ref 32–45)

## 2022-09-18 LAB — TROPONIN I (HIGH SENSITIVITY): Troponin I (High Sensitivity): 15 ng/L (ref <18)

## 2022-09-18 LAB — CBG MONITORING, ED: Glucose-Capillary: 232 mg/dL — ABNORMAL HIGH (ref 70–99)

## 2022-09-18 MED ORDER — SODIUM CHLORIDE 0.9% FLUSH
3.0000 mL | Freq: Two times a day (BID) | INTRAVENOUS | Status: DC
  Administered 2022-09-18: 3 mL via INTRAVENOUS

## 2022-09-18 MED ORDER — ONDANSETRON HCL 4 MG/2ML IJ SOLN: 4.0000 mg | Freq: Four times a day (QID) | INTRAMUSCULAR | Status: DC | PRN

## 2022-09-18 MED ORDER — BACLOFEN 10 MG PO TABS: 5.0000 mg | ORAL_TABLET | Freq: Every day | ORAL | Status: DC | PRN

## 2022-09-18 MED ORDER — PANTOPRAZOLE SODIUM 40 MG IV SOLR
40.0000 mg | INTRAVENOUS | Status: DC
  Administered 2022-09-18 – 2022-09-19 (×2): 40 mg via INTRAVENOUS
  Filled 2022-09-18 (×2): qty 10

## 2022-09-18 MED ORDER — HYDROCODONE-ACETAMINOPHEN 5-325 MG PO TABS: 1.0000 | ORAL_TABLET | ORAL | Status: DC | PRN

## 2022-09-18 MED ORDER — SODIUM CHLORIDE 0.9 % IV BOLUS
1000.0000 mL | Freq: Once | INTRAVENOUS | Status: AC
  Administered 2022-09-18: 1000 mL via INTRAVENOUS

## 2022-09-18 MED ORDER — ATORVASTATIN CALCIUM 80 MG PO TABS
80.0000 mg | ORAL_TABLET | Freq: Every day | ORAL | Status: DC
  Administered 2022-09-18 – 2022-09-19 (×2): 80 mg via ORAL
  Filled 2022-09-18 (×2): qty 1

## 2022-09-18 MED ORDER — INSULIN GLARGINE-YFGN 100 UNIT/ML ~~LOC~~ SOLN
8.0000 [IU] | Freq: Every day | SUBCUTANEOUS | Status: DC
  Administered 2022-09-19 – 2022-09-20 (×2): 8 [IU] via SUBCUTANEOUS
  Filled 2022-09-18 (×2): qty 0.08

## 2022-09-18 MED ORDER — ACETAMINOPHEN 325 MG PO TABS
650.0000 mg | ORAL_TABLET | Freq: Four times a day (QID) | ORAL | Status: DC | PRN
  Administered 2022-09-20: 650 mg via ORAL
  Filled 2022-09-18: qty 2

## 2022-09-18 MED ORDER — ACETAMINOPHEN 650 MG RE SUPP: 650.0000 mg | Freq: Four times a day (QID) | RECTAL | Status: DC | PRN

## 2022-09-18 MED ORDER — SODIUM CHLORIDE 0.9 % IV SOLN: INTRAVENOUS | Status: DC

## 2022-09-18 MED ORDER — LENALIDOMIDE 10 MG PO CAPS
10.0000 mg | ORAL_CAPSULE | Freq: Every day | ORAL | Status: DC
  Administered 2022-09-19 – 2022-09-20 (×2): 10 mg via ORAL
  Filled 2022-09-18 (×2): qty 1

## 2022-09-18 MED ORDER — ONDANSETRON HCL 4 MG PO TABS: 4.0000 mg | ORAL_TABLET | Freq: Four times a day (QID) | ORAL | Status: DC | PRN

## 2022-09-18 NOTE — Assessment & Plan Note (Signed)
Patient's somnolent on arrival Possibly related to substance abuse Will get UDS.  Was positive for cocaine and cannabinoids during hospitalization in May 2024 Neurologic checks

## 2022-09-18 NOTE — Assessment & Plan Note (Signed)
Likely prerenal.  Creatinine 1.91, up from baseline of 1.14 about 3 weeks prior IV hydration Monitor renal function and avoid nephrotoxins

## 2022-09-18 NOTE — ED Notes (Signed)
Patient transported to CT 

## 2022-09-18 NOTE — Assessment & Plan Note (Signed)
Sodium 128.  Secondary to dehydration IV hydration with NS and monitor

## 2022-09-18 NOTE — Assessment & Plan Note (Addendum)
History of orthostatic hypotension, previously on midodrine and Florinef Suspect related to dehydration from poor oral intake Continue IV hydration Orthostatic vital signs after adequate hydration

## 2022-09-18 NOTE — Assessment & Plan Note (Addendum)
Admitted for multifactorial syncope in May 2024 Believed multifactorial and secondary to dehydration, hyperglycemia marijuana use, orthostatic hypotension (unknown if patient resumed home antihypertensives following last discharge), possible symptomatic anemia, general physical deconditioning. Patient was somnolent and hypotensive on arrival No stigmata of acute infection  -Continuous cardiac monitoring - IV hydration - Hold home antihypertensives - Serial H&H and transfuse if necessary (patient receives erythropoietin with hematology) - Stool for occult blood and consider GI evaluation if downtrending H&H - UDS Continuous cardiac monitoring

## 2022-09-18 NOTE — Assessment & Plan Note (Addendum)
History of GI bleed/melena 05/2022, EGD with erosive gastropathy Hemoglobin 7.4, down from 8 on 6/11 Serial H&H and transfuse if it falls below 7 Receives erythropoietin with oncologist -IV Protonix -Hold aspirin and Plavix -Can consider GI consult if hemoglobin downtrending

## 2022-09-18 NOTE — Assessment & Plan Note (Signed)
Follow-up UDS. For counseling on cessation when more awake and alert

## 2022-09-18 NOTE — ED Provider Notes (Signed)
[facility] Provider Note    Event Date/Time   First MD Initiated Contact with Patient 09/18/22 1929     (approximate)   History   Hyperglycemia   HPI  Nicholas Weiss is a 72 y.o. male on review of note from May 29 has a history of hypoglycemia multiple myeloma cocaine use acute kidney injury hyponatremia orthostatic hypotension uncontrolled type 1 diabetes   Patient reports that he got up felt lightheaded and then passed out.  He relates that he does not typically use marijuana, but was using marijuana today.  After a while he got up from a chair got lightheaded and passed out.  He thinks his family or someone called EMS  Reports he is pretty sure that he passed out.  He reports this happened once before with the last month or 2.  He also reports he chronically suffers a bit of a balance issue for the last couple months.  No headache no chest pain no neck pain.  No other symptoms.  Reports has been otherwise in his normal health but does not typically use marijuana  Physical Exam   Triage Vital Signs: ED Triage Vitals  Enc Vitals Group     BP 09/18/22 1926 102/62     Pulse Rate 09/18/22 1926 82     Resp 09/18/22 1926 15     Temp 09/18/22 1926 98.5 F (36.9 C)     Temp Source 09/18/22 1926 Oral     SpO2 09/18/22 1912 98 %     Weight --      Height --      Head Circumference --      Peak Flow --      Pain Score --      Pain Loc --      Pain Edu? --      Excl. in GC? --     Most recent vital signs: Vitals:   09/18/22 1926 09/18/22 1930  BP: 102/62 103/63  Pulse: 82 68  Resp: 15 (!) 9  Temp: 98.5 F (36.9 C)   SpO2: 100% 100%     General: Awake, no distress.  He is somewhat somnolent but alert, oriented to person place year and month. Mucous membranes are dry Normocephalic atraumatic set for a small abrasion along the right superior orbit CV:  Good peripheral perfusion.  Normal tones and rate Resp:  Normal effort.  Clear lungs  bilateral Abd:  No distention.  Soft nontender nondistended Other:  No lower extremity edema.  Moves all extremities well without deficit   ED Results / Procedures / Treatments   Labs (all labs ordered are listed, but only abnormal results are displayed) Labs Reviewed  CBC - Abnormal; Notable for the following components:      Result Value   WBC 2.6 (*)    RBC 2.38 (*)    Hemoglobin 7.4 (*)    HCT 22.7 (*)    RDW 15.7 (*)    All other components within normal limits  COMPREHENSIVE METABOLIC PANEL - Abnormal; Notable for the following components:   Sodium 128 (*)    Glucose, Bld 242 (*)    BUN 41 (*)    Creatinine, Ser 1.91 (*)    Calcium 7.7 (*)    Albumin 3.3 (*)    GFR, Estimated 37 (*)    All other components within normal limits  BLOOD GAS, VENOUS - Abnormal; Notable for the following components:   pCO2, Ven 43 (*)  pO2, Ven 54 (*)    All other components within normal limits  CBG MONITORING, ED - Abnormal; Notable for the following components:   Glucose-Capillary 232 (*)    All other components within normal limits  LIPASE, BLOOD  LACTIC ACID, PLASMA  URINALYSIS, ROUTINE W REFLEX MICROSCOPIC  URINE DRUG SCREEN, QUALITATIVE ([facility] ONLY)  CBC  CBC  BASIC METABOLIC PANEL  TROPONIN I (HIGH SENSITIVITY)     EKG  And interpreted by me at 2010 heart rate 70 QRS QT approximates 400, but notably he has significant first-degree AV block where in the P wave seems to be derived on the downslope of the T wave.   RADIOLOGY  CT head interpreted by me as negative for acute gross intracranial hemorrhage  CT Head Wo Contrast  Result Date: 09/18/2022 CLINICAL DATA:  Weakness, fell, altered level of consciousness EXAM: CT HEAD WITHOUT CONTRAST TECHNIQUE: Contiguous axial images were obtained from the base of the skull through the vertex without intravenous contrast. RADIATION DOSE REDUCTION: This exam was performed according to the departmental dose-optimization program  which includes automated exposure control, adjustment of the mA and/or kV according to patient size and/or use of iterative reconstruction technique. COMPARISON:  05/15/2022 FINDINGS: Brain: No acute infarct or hemorrhage. Lateral ventricles and midline structures are unremarkable. No acute extra-axial fluid collections. No mass effect. Diffuse coarse benign-appearing dural calcifications unchanged. Vascular: No hyperdense vessel or unexpected calcification. Skull: Normal. Negative for fracture or focal lesion. Sinuses/Orbits: No acute finding. Other: None. IMPRESSION: 1. No acute intracranial process. Electronically Signed   By: Sharlet Salina M.D.   On: 09/18/2022 19:57   DG Chest Portable 1 View  Result Date: 09/18/2022 CLINICAL DATA:  Altered mental status EXAM: PORTABLE CHEST 1 VIEW COMPARISON:  08/18/2022 FINDINGS: The heart size and mediastinal contours are within normal limits. Aortic atherosclerosis. Streaky atelectasis left lung base. Both lungs are clear. The visualized skeletal structures are unremarkable. IMPRESSION: Streaky atelectasis at the left base. Electronically Signed   By: Jasmine Pang M.D.   On: 09/18/2022 19:53       PROCEDURES:  Critical Care performed: No  Procedures   MEDICATIONS ORDERED IN ED: Medications  pantoprazole (PROTONIX) injection 40 mg (has no administration in time range)  atorvastatin (LIPITOR) tablet 80 mg (has no administration in time range)  insulin degludec (TRESIBA) 100 UNIT/ML FlexTouch Pen 8 Units (has no administration in time range)  lenalidomide (REVLIMID) capsule 10 mg (has no administration in time range)  sodium chloride flush (NS) 0.9 % injection 3 mL (has no administration in time range)  0.9 %  sodium chloride infusion (has no administration in time range)  acetaminophen (TYLENOL) tablet 650 mg (has no administration in time range)    Or  acetaminophen (TYLENOL) suppository 650 mg (has no administration in time range)   HYDROcodone-acetaminophen (NORCO/VICODIN) 5-325 MG per tablet 1-2 tablet (has no administration in time range)  ondansetron (ZOFRAN) tablet 4 mg (has no administration in time range)    Or  ondansetron (ZOFRAN) injection 4 mg (has no administration in time range)  baclofen (LIORESAL) tablet 5 mg (has no administration in time range)  sodium chloride 0.9 % bolus 1,000 mL (1,000 mLs Intravenous New Bag/Given 09/18/22 2031)     IMPRESSION / MDM / ASSESSMENT AND PLAN / ED COURSE  I reviewed the triage vital signs and the nursing notes.  Differential diagnosis includes, but is not limited to, dehydration, polysubstance abuse, electrolyte abnormality, acute anemia, cardiac dysrhythmia, carotid stenosis, etc.  The differential diagnosis quite broad but given the patient's clinical history as well as his recent admission with orthostatic hypotension, I suspect similar in nature today.  His neurologic exam is normal he is alert well-oriented.  He does appear slightly hypovolemic by examination  Reviewed discharge summary from May 29 patient had postural dizziness with orthostatic hypotension.  He was monitored at that time on telemetry noted to have first-degree AV block.  Blood pressure was low and many of his blood pressure medications were discontinued.  He has a history of polysubstance use disorder as well as essential hypertension and multiple myeloma  Patient's presentation is most consistent with acute complicated illness / injury requiring diagnostic workup.   The patient is on the cardiac monitor to evaluate for evidence of arrhythmia and/or significant heart rate changes.   Looking back at his labs, his renal function was relatively normal until about 10 days ago.  His renal function is stable for the last 10 days but prior to that seem to demonstrate pretty normal function.  I suspect an element of ongoing acute kidney injury.  Difficult to exclude some  element of chronicity, but suspect AKI is likely secondary to prerenal cause.  I also suspect ongoing polysubstance abuse as he does report smoking and use of marijuana throughout the day today preceding this event.  History of cocaine abuse, awaiting drug screen  Also noted is a mild to moderate anemia.  He has known multiple myeloma leukopenia is present but appears to be chronic   Discussed case and care with Dr. Para March our hospitalist, after consultation patient will be excepted to the hospitalist service for further care and management  FINAL CLINICAL IMPRESSION(S) / ED DIAGNOSES   Final diagnoses:  Syncope and collapse  Acute kidney injury superimposed on chronic kidney disease (HCC)     Rx / DC Orders   ED Discharge Orders     None        Note:  This document was prepared using Dragon voice recognition software and may include unintentional dictation errors.   Sharyn Creamer, MD 09/18/22 2224

## 2022-09-18 NOTE — ED Triage Notes (Signed)
Pt coming from home via [facility] with c/o diabetic issues/weakness that led to a fall earlier today. Pt denies LOC, same was not witnessed. Family states pt has been acting differently following the fall, stating that he is A&Ox4, but slow to respond. EMS found pt hypotensive and responsive. Pt given 500 mL en-route with improvement in blood pressure (81/49 to 98/58). Family endorses previous head injury, unable to specify, but endorsing that pt is not at baseline. Pt states he has smoked weed today, denies any other drugs or alcohol. Pt appears very sleepy during triage.

## 2022-09-18 NOTE — Assessment & Plan Note (Signed)
Blood sugar 242 Sliding scale insulin coverage Low-dose basal insulin.  Uses 8 units at home

## 2022-09-18 NOTE — Assessment & Plan Note (Addendum)
Followed by oncology.  On Revlimid an weekly daratumumab .  Last seen 6/11

## 2022-09-18 NOTE — H&P (Signed)
History and Physical    Patient: Nicholas Weiss XLK:[id number] DOB: Dec 05, 1950 DOA: 09/18/2022 DOS: the patient was seen and examined on 09/18/2022 PCP: Marisue Ivan, MD  Patient coming from: Home  Chief Complaint:  Chief Complaint  Patient presents with   Hyperglycemia    HPI: Nicholas Weiss is a 72 y.o. male with medical history significant for Type 1 diabetes, HTN, cocaine and marijuana use, HTN, multiple myeloma on chemotherapy, last treatment 6/11, chronic anemia on EPO, history of GI bleed 05/2022 with EGD showing erosive gastropathy, orthostatic hypotension previously on midodrine and Florinef, hospitalized a month ago from 5/21 to 08/26/2022 with a presyncopal episode attributed to dehydration, orthostatic hypotension and drug use, with discontinuation of home antihypertensives at discharge, who was brought in by EMS with an unwitnessed fall earlier in the day and with concerns for lethargy.  On arrival of EMS BP was 81/49, fluid responsive to 98/50 with 500 mL bolus en route.  Patient was somnolent on arrival to the ED.  Patient endorses smoking marijuana prior to the episode.  He thinks he might have passed out.  He denies nausea or vomiting, black or bloody stool, abdominal pain.  History limited due to somnolence ED course and dataReview: BP 102/62 by arrival with otherwise normal vitals VBG unremarkable with pH 7.39 and pCO2 42.  CBC notable for WBC of 2.6 and hemoglobin 7.4 down from 8.0 on 6/11.  Sodium 128, glucose 242, creatinine 1.91, up from 1.14 about 3 weeks prior.  Lipase and LFTs normal.  Lactic acid 1.6, troponin 15. EKG, personally viewed and interpreted showing junctional rhythm of 69 with slightly prolonged QT interval. CT head nonacute as well as chest x-ray. Patient given a 1 L NS bolus. Hospitalist consulted for admission.     Past Medical History:  Diagnosis Date   Acute metabolic encephalopathy 12/02/2020   DKA (diabetic ketoacidoses) 04/06/2016    Hypercholesteremia    Hypertension    Past Surgical History:  Procedure Laterality Date   AMPUTATION TOE Left 04/25/2022   Procedure: LEFT GREAT TOE AMPUTATION AND LEFT PARTIAL 2ND TOE AMPUTATION;  Surgeon: Felecia Shelling, DPM;  Location: [facility] ORS;  Service: Podiatry;  Laterality: Left;   COLONOSCOPY WITH PROPOFOL N/A 02/01/2020   Procedure: COLONOSCOPY WITH PROPOFOL;  Surgeon: Toney Reil, MD;  Location: [facility] ENDOSCOPY;  Service: Gastroenterology;  Laterality: N/A;   ESOPHAGOGASTRODUODENOSCOPY  02/01/2020   Procedure: ESOPHAGOGASTRODUODENOSCOPY (EGD);  Surgeon: Toney Reil, MD;  Location: [facility] ENDOSCOPY;  Service: Gastroenterology;;   ESOPHAGOGASTRODUODENOSCOPY (EGD) WITH PROPOFOL N/A 06/07/2022   Procedure: ESOPHAGOGASTRODUODENOSCOPY (EGD) WITH PROPOFOL;  Surgeon: Kerin Salen, MD;  Location: [facility] ENDOSCOPY;  Service: Gastroenterology;  Laterality: N/A;   KNEE SURGERY Right    Torn meniscus   KNEE SURGERY Left    Social History:  reports that he has never smoked. He has never used smokeless tobacco. He reports that he does not currently use alcohol. He reports current drug use. Drugs: Marijuana and "Crack" cocaine.  Allergies  Allergen Reactions   Penicillins Other (See Comments)    Childhood allergy -tolerated amoxil 03/2022 Unknown reaction Has patient had a PCN reaction causing immediate rash, facial/tongue/throat swelling, SOB or lightheadedness with hypotension: No Has patient had a PCN reaction causing severe rash involving mucus membranes or skin necrosis: No Has patient had a PCN reaction that required hospitalization No Has patient had a PCN reaction occurring within the last 10 years: No If all of the above answers are "NO", then may proceed with  Cephalosporin use.     Family History  Problem Relation Age of Onset   Heart attack Father    Hypertension Sister    Cancer Sister     Prior to Admission medications   Medication Sig Start Date End Date Taking?  Authorizing Provider  acetaminophen (TYLENOL) 500 MG tablet Take 1,000 mg by mouth daily as needed for mild pain or moderate pain.    [provider]  acyclovir (ZOVIRAX) 400 MG tablet Take 1 tablet (400 mg total) by mouth 2 (two) times daily. 08/06/22   Creig Hines, MD  ascorbic acid (VITAMIN C) 500 MG tablet Take 1 tablet (500 mg total) by mouth daily. 04/09/22   Arnetha Courser, MD  aspirin EC 81 MG tablet Take 81 mg by mouth in the morning.    [provider]  atorvastatin (LIPITOR) 80 MG tablet Take 1 tablet (80 mg total) by mouth at bedtime. Hold while taking Paxlovid 04/08/22   Arnetha Courser, MD  Baclofen 5 MG TABS Take 5 mg by mouth daily as needed (muscle spasm).    [provider]  clopidogrel (PLAVIX) 75 MG tablet Take 75 mg by mouth in the morning. 12/03/21   [provider]  Continuous Glucose Sensor (DEXCOM G7 SENSOR) MISC Use 1 each every 10 (ten) days 07/27/22   [provider]  docusate sodium (COLACE) 100 MG capsule Take 100 mg by mouth daily as needed for mild constipation.    [provider]  feeding supplement, GLUCERNA SHAKE, (GLUCERNA SHAKE) LIQD Take 237 mLs by mouth 3 (three) times daily between meals. 04/29/22   Pennie Banter, DO  Glucagon (BAQSIMI ONE PACK) 3 MG/DOSE POWD Place 3 mg into the nose once as needed for up to 1 dose (for low sugar). 07/22/22   Concha Se, MD  insulin degludec (TRESIBA FLEXTOUCH) 100 UNIT/ML FlexTouch Pen Inject 8 Units into the skin daily. 08/26/22   Marrion Coy, MD  lenalidomide (REVLIMID) 10 MG capsule Take 1 capsule (10 mg total) by mouth daily. Take for 14 days, then hold for 7 days. Repeat every 21 days. 09/01/22   Creig Hines, MD  lisinopril (ZESTRIL) 2.5 MG tablet Take 2.5 mg by mouth daily. 06/25/22   [provider]  Multiple Vitamin (MULTIVITAMIN WITH MINERALS) TABS tablet Take 1 tablet by mouth daily. 04/09/22   Arnetha Courser, MD  NOVOLOG 100 UNIT/ML injection Sliding  Scale: Glucose 70 - 120: 0 units  Glucose 121 - 150: 1 unit  Glucose 151 - 200: 2 units Glucose 201 - 250: 3 units  Glucose 251 - 300: 5 units  Glucose 301 - 350: 7 units  Glucose 351 - 400: 9 units  Glucose > 400: call your docotr 06/12/22   Jerald Kief, MD  ondansetron (ZOFRAN) 8 MG tablet Take 8 mg by mouth daily as needed for nausea or vomiting.    [provider]  pantoprazole (PROTONIX) 40 MG tablet Take 1 tablet (40 mg total) by mouth 2 (two) times daily. Take twice daily for 2 months then decrease to once daily 06/12/22   Russella Dar, NP    Physical Exam: Vitals:   09/18/22 1912 09/18/22 1926  BP:  102/62  Pulse:  82  Resp:  15  Temp:  98.5 F (36.9 C)  TempSrc:  Oral  SpO2: 98% 100%   Physical Exam Vitals and nursing note reviewed.  Constitutional:      General: He is not in acute distress. HENT:  Head: Normocephalic and atraumatic.  Cardiovascular:     Rate and Rhythm: Normal rate and regular rhythm.     Heart sounds: Normal heart sounds.  Pulmonary:     Effort: Pulmonary effort is normal.     Breath sounds: Normal breath sounds.  Abdominal:     Palpations: Abdomen is soft.     Tenderness: There is no abdominal tenderness.  Neurological:     Mental Status: He is lethargic.     Labs on Admission: I have personally reviewed following labs and imaging studies  CBC: Recent Labs  Lab 09/18/22 2011  WBC 2.6*  HGB 7.4*  HCT 22.7*  MCV 95.4  PLT 207   Basic Metabolic Panel: Recent Labs  Lab 09/18/22 2011  NA 128*  K 4.6  CL 101  CO2 22  GLUCOSE 242*  BUN 41*  CREATININE 1.91*  CALCIUM 7.7*   GFR: Estimated Creatinine Clearance: 36.3 mL/min (A) (by C-G formula based on SCr of 1.91 mg/dL (H)). Liver Function Tests: Recent Labs  Lab 09/18/22 2011  AST 26  ALT 30  ALKPHOS 49  BILITOT 0.9  PROT 7.8  ALBUMIN 3.3*   Recent Labs  Lab 09/18/22 2011  LIPASE 28   No results for input(s): "AMMONIA" in the last 168  hours. Coagulation Profile: No results for input(s): "INR", "PROTIME" in the last 168 hours. Cardiac Enzymes: No results for input(s): "CKTOTAL", "CKMB", "CKMBINDEX", "TROPONINI" in the last 168 hours. BNP (last 3 results) No results for input(s): "PROBNP" in the last 8760 hours. HbA1C: No results for input(s): "HGBA1C" in the last 72 hours. CBG: Recent Labs  Lab 09/18/22 1942  GLUCAP 232*   Lipid Profile: No results for input(s): "CHOL", "HDL", "LDLCALC", "TRIG", "CHOLHDL", "LDLDIRECT" in the last 72 hours. Thyroid Function Tests: No results for input(s): "TSH", "T4TOTAL", "FREET4", "T3FREE", "THYROIDAB" in the last 72 hours. Anemia Panel: No results for input(s): "VITAMINB12", "FOLATE", "FERRITIN", "TIBC", "IRON", "RETICCTPCT" in the last 72 hours. Urine analysis:    Component Value Date/Time   COLORURINE YELLOW (A) 08/19/2022 0349   APPEARANCEUR CLEAR (A) 08/19/2022 0349   LABSPEC 1.016 08/19/2022 0349   PHURINE 5.0 08/19/2022 0349   GLUCOSEU >=500 (A) 08/19/2022 0349   HGBUR NEGATIVE 08/19/2022 0349   BILIRUBINUR NEGATIVE 08/19/2022 0349   KETONESUR NEGATIVE 08/19/2022 0349   PROTEINUR 30 (A) 08/19/2022 0349   NITRITE NEGATIVE 08/19/2022 0349   LEUKOCYTESUR NEGATIVE 08/19/2022 0349    Radiological Exams on Admission: CT Head Wo Contrast  Result Date: 09/18/2022 CLINICAL DATA:  Weakness, fell, altered level of consciousness EXAM: CT HEAD WITHOUT CONTRAST TECHNIQUE: Contiguous axial images were obtained from the base of the skull through the vertex without intravenous contrast. RADIATION DOSE REDUCTION: This exam was performed according to the departmental dose-optimization program which includes automated exposure control, adjustment of the mA and/or kV according to patient size and/or use of iterative reconstruction technique. COMPARISON:  05/15/2022 FINDINGS: Brain: No acute infarct or hemorrhage. Lateral ventricles and midline structures are unremarkable. No acute  extra-axial fluid collections. No mass effect. Diffuse coarse benign-appearing dural calcifications unchanged. Vascular: No hyperdense vessel or unexpected calcification. Skull: Normal. Negative for fracture or focal lesion. Sinuses/Orbits: No acute finding. Other: None. IMPRESSION: 1. No acute intracranial process. Electronically Signed   By: Sharlet Salina M.D.   On: 09/18/2022 19:57   DG Chest Portable 1 View  Result Date: 09/18/2022 CLINICAL DATA:  Altered mental status EXAM: PORTABLE CHEST 1 VIEW COMPARISON:  08/18/2022 FINDINGS: The heart size and  mediastinal contours are within normal limits. Aortic atherosclerosis. Streaky atelectasis left lung base. Both lungs are clear. The visualized skeletal structures are unremarkable. IMPRESSION: Streaky atelectasis at the left base. Electronically Signed   By: Jasmine Pang M.D.   On: 09/18/2022 19:53     Data Reviewed: Relevant notes from primary care and specialist visits, past discharge summaries as available in EHR, including Care Everywhere. Prior diagnostic testing as pertinent to current admission diagnoses Updated medications and problem lists for reconciliation ED course, including vitals, labs, imaging, treatment and response to treatment Triage notes, nursing and pharmacy notes and ED provider's notes Notable results as noted in HPI   Assessment and Plan: * Syncope and collapse, recurrent Admitted for multifactorial syncope in May 2024 Believed multifactorial and secondary to dehydration, hyperglycemia marijuana use, orthostatic hypotension (unknown if patient resumed home antihypertensives following last discharge), possible symptomatic anemia, general physical deconditioning. Patient was somnolent and hypotensive on arrival No stigmata of acute infection  -Continuous cardiac monitoring - IV hydration - Hold home antihypertensives - Serial H&H and transfuse if necessary (patient receives erythropoietin with hematology) - Stool for  occult blood and consider GI evaluation if downtrending H&H - UDS Continuous cardiac monitoring   Hypotension History of orthostatic hypotension, previously on midodrine and Florinef Suspect related to dehydration from poor oral intake Continue IV hydration Orthostatic vital signs after adequate hydration  Acute metabolic encephalopathy Patient's somnolent on arrival Possibly related to substance abuse Will get UDS.  Was positive for cocaine and cannabinoids during hospitalization in May 2024 Neurologic checks  AKI (acute kidney injury) (HCC) Likely prerenal.  Creatinine 1.91, up from baseline of 1.14 about 3 weeks prior IV hydration Monitor renal function and avoid nephrotoxins  Uncontrolled type 1 diabetes mellitus with hyperglycemia, with long-term current use of insulin (HCC) Blood sugar 242 Sliding scale insulin coverage Low-dose basal insulin.  Uses 8 units at home  Symptomatic anemia History of GI bleed/melena 05/2022, EGD with erosive gastropathy Hemoglobin 7.4, down from 8 on 6/11 Serial H&H and transfuse if it falls below 7 Receives erythropoietin with oncologist -IV Protonix -Hold aspirin and Plavix -Can consider GI consult if hemoglobin downtrending   Hyponatremia Sodium 128.  Secondary to dehydration IV hydration with NS and monitor  Multiple myeloma (HCC) Followed by oncology.  On Revlimid an weekly daratumumab .  Last seen 6/11   Substance abuse (HCC) Follow-up UDS. For counseling on cessation when more awake and alert        DVT prophylaxis: SCD  Consults: none  Advance Care Planning:   Code Status: Prior   Family Communication: none  Disposition Plan: Back to previous home environment  Severity of Illness: The appropriate patient status for this patient is OBSERVATION. Observation status is judged to be reasonable and necessary in order to provide the required intensity of service to ensure the patient's safety. The patient's  presenting symptoms, physical exam findings, and initial radiographic and laboratory data in the context of their medical condition is felt to place them at decreased risk for further clinical deterioration. Furthermore, it is anticipated that the patient will be medically stable for discharge from the hospital within 2 midnights of admission.   Author: Andris Baumann, MD 09/18/2022 9:52 PM  For on call review [email or web address].

## 2022-09-19 DIAGNOSIS — R55 Syncope and collapse: Secondary | ICD-10-CM | POA: Diagnosis not present

## 2022-09-19 LAB — BASIC METABOLIC PANEL
Anion gap: 5 (ref 5–15)
BUN: 39 mg/dL — ABNORMAL HIGH (ref 8–23)
CO2: 22 mmol/L (ref 22–32)
Calcium: 7.8 mg/dL — ABNORMAL LOW (ref 8.9–10.3)
Chloride: 108 mmol/L (ref 98–111)
Creatinine, Ser: 1.68 mg/dL — ABNORMAL HIGH (ref 0.61–1.24)
GFR, Estimated: 44 mL/min — ABNORMAL LOW (ref >60)
Glucose, Bld: 284 mg/dL — ABNORMAL HIGH (ref 70–99)
Potassium: 4.5 mmol/L (ref 3.5–5.1)
Sodium: 132 mmol/L — ABNORMAL LOW (ref 135–145)

## 2022-09-19 LAB — CBC
HCT: 20 % — ABNORMAL LOW (ref 39.0–52.0)
HCT: 22.2 % — ABNORMAL LOW (ref 39.0–52.0)
Hemoglobin: 6.5 g/dL — ABNORMAL LOW (ref 13.0–17.0)
Hemoglobin: 7.1 g/dL — ABNORMAL LOW (ref 13.0–17.0)
MCH: 31 pg (ref 26.0–34.0)
MCH: 30.6 pg (ref 26.0–34.0)
MCHC: 32.5 g/dL (ref 30.0–36.0)
MCHC: 32 g/dL (ref 30.0–36.0)
MCV: 95.2 fL (ref 80.0–100.0)
MCV: 95.7 fL (ref 80.0–100.0)
Platelets: 182 10*3/uL (ref 150–400)
Platelets: 199 10*3/uL (ref 150–400)
RBC: 2.1 MIL/uL — ABNORMAL LOW (ref 4.22–5.81)
RBC: 2.32 MIL/uL — ABNORMAL LOW (ref 4.22–5.81)
RDW: 15.5 % (ref 11.5–15.5)
RDW: 15.6 % — ABNORMAL HIGH (ref 11.5–15.5)
WBC: 2.5 10*3/uL — ABNORMAL LOW (ref 4.0–10.5)
WBC: 3.1 10*3/uL — ABNORMAL LOW (ref 4.0–10.5)
nRBC: 0 % (ref 0.0–0.2)
nRBC: 0 % (ref 0.0–0.2)

## 2022-09-19 LAB — GLUCOSE, CAPILLARY: Glucose-Capillary: 275 mg/dL — ABNORMAL HIGH (ref 70–99)

## 2022-09-19 MED ORDER — LISINOPRIL 20 MG PO TABS
20.0000 mg | ORAL_TABLET | Freq: Every day | ORAL | Status: DC
  Administered 2022-09-19 – 2022-09-20 (×2): 20 mg via ORAL
  Filled 2022-09-19 (×2): qty 1

## 2022-09-19 NOTE — Plan of Care (Signed)
  Problem: Education: Goal: Knowledge of condition and prescribed therapy will improve Outcome: Progressing   Problem: Cardiac: Goal: Will achieve and/or maintain adequate cardiac output Outcome: Progressing   Problem: Physical Regulation: Goal: Complications related to the disease process, condition or treatment will be avoided or minimized Outcome: Progressing   

## 2022-09-19 NOTE — Progress Notes (Signed)
Progress Note   Patient: Nicholas Weiss WUJ:[id number] DOB: [birth year] DOA: 09/18/2022     0 DOS: the patient was seen and examined on 09/19/2022    Subjective:  Patient seen and examined at bedside this morning Said to have had syncopal episode at home Patient appeared dehydrated on admission Denies nausea vomiting abdominal pain He did ask whether he will get to go home today and I explained to him why he needs to be in the hospital   Brief hospital course:  From HPI "Nicholas Weiss is a 72 y.o. male with medical history significant for Type 1 diabetes, HTN, cocaine and marijuana use, HTN, multiple myeloma on chemotherapy, last treatment 6/11, chronic anemia on EPO, history of GI bleed 05/2022 with EGD showing erosive gastropathy, orthostatic hypotension previously on midodrine and Florinef, hospitalized a month ago from 5/21 to 08/26/2022 with a presyncopal episode attributed to dehydration, orthostatic hypotension and drug use, with discontinuation of home antihypertensives at discharge, who was brought in by EMS with an unwitnessed fall earlier in the day and with concerns for lethargy.  On arrival of EMS BP was 81/49, fluid responsive to 98/50 with 500 mL bolus en route.  Patient was somnolent on arrival to the ED.  Patient endorses smoking marijuana prior to the episode.  He thinks he might have passed out.  He denies nausea or vomiting, black or bloody stool, abdominal pain.  History limited due to somnolence ED course and dataReview: BP 102/62 by arrival with otherwise normal vitals VBG unremarkable with pH 7.39 and pCO2 42.  CBC notable for WBC of 2.6 and hemoglobin 7.4 down from 8.0 on 6/11.  Sodium 128, glucose 242, creatinine 1.91, up from 1.14 about 3 weeks prior.  Lipase and LFTs normal.  Lactic acid 1.6, troponin 15."  Assessment and Plan:  * Syncope and collapse, recurrent Admitted for multifactorial syncope in May 2024 Believed multifactorial and secondary to dehydration,  hyperglycemia marijuana use, orthostatic hypotension (unknown if patient resumed home antihypertensives following last discharge), possible symptomatic anemia, general physical deconditioning. Patient was somnolent and hypotensive on arrival No stigmata of acute infection  -Continuous cardiac monitoring Continue IV hydration Continue telemetry monitoring Patient initiated on lisinopril given persistent hypotension Monitor orthostatic vitals closely Monitor H&H (patient receives erythropoietin with hematology) Follow-up stool for occult blood and consider GI evaluation if downtrending H&H Follow-up on urine drug screen I have reviewed the patient's EKG tracing showing evidence of junctional rhythm I reviewed the patient's CT scan of the brain that did not show acute intracranial pathology Chest x-ray did not show any infiltrates   Hypotension-improved History of orthostatic hypotension, previously on midodrine and Florinef Suspect related to dehydration from poor oral intake Continue IV hydration Orthostatic vital signs after adequate hydration   Acute metabolic encephalopathy Patient's somnolent on arrival Follow-up on urine drug screen Monitor neurochecks closely   AKI (acute kidney injury) (HCC) secondary to dehydration Renal function which is now improving Continue IV fluid Monitor renal function   Uncontrolled type 1 diabetes mellitus with hyperglycemia, with long-term current use of insulin (HCC) Blood sugar 242 Sliding scale insulin coverage Low-dose basal insulin.  Uses 8 units at home   Symptomatic anemia History of GI bleed/melena 05/2022, EGD with erosive gastropathy Hemoglobin 7.4, down from 8 on 6/11 Serial H&H and transfuse if it falls below 7 Receives erythropoietin with oncologist - Continue IV Protonix - Continue to hold aspirin and Plavix -Can consider GI consult if hemoglobin downtrending     Hyponatremia-improving  Sodium 128.  Secondary to  dehydration IV hydration with NS and monitor   Multiple myeloma (HCC) Followed by oncology.  On Revlimid an weekly daratumumab .  Last seen 6/11     Substance abuse (HCC) Follow-up UDS. For counseling on cessation when more awake and alert     DVT prophylaxis: SCD   Consults: none   Advance Care Planning:   Code Status: Prior    Family Communication: none   Disposition Plan: Back to previous home environment   Physical Exam: Vitals and nursing note reviewed.  Constitutional:      General: In no acute distress HENT:     Head: Normocephalic and atraumatic.  Cardiovascular: Normal rate and rhythm    Heart sounds: Normal heart sounds.  Pulmonary:     Effort: Pulmonary effort is normal.     Breath sounds: Normal breath sounds.  Abdominal:     Palpations: Abdomen is soft.     Tenderness: There is no abdominal tenderness.  Neurological:   Vitals:   09/19/22 0344 09/19/22 0812 09/19/22 1151 09/19/22 1257  BP: 123/75 (!) 150/90 (!) 164/103 (!) 179/117  Pulse: 71 76 74 75  Resp: 20 12 14    Temp: 97.8 F (36.6 C) 98.2 F (36.8 C) 97.7 F (36.5 C)   TempSrc:  Oral Oral   SpO2: 100% 99% 99%   Weight:      Height:         Author: Loyce Dys, MD 09/19/2022 3:35 PM  For on call review [email or web address].

## 2022-09-19 NOTE — Plan of Care (Signed)
  Problem: Education: Goal: Knowledge of condition and prescribed therapy will improve Outcome: Progressing   Problem: Activity: Goal: Risk for activity intolerance will decrease Outcome: Progressing   Problem: Nutrition: Goal: Adequate nutrition will be maintained Outcome: Progressing   Problem: Elimination: Goal: Will not experience complications related to urinary retention Outcome: Progressing   Problem: Pain Managment: Goal: General experience of comfort will improve Outcome: Progressing   Problem: Safety: Goal: Ability to remain free from injury will improve Outcome: Progressing   Problem: Skin Integrity: Goal: Risk for impaired skin integrity will decrease Outcome: Progressing

## 2022-09-19 NOTE — Progress Notes (Signed)
   09/19/22 1543  Vitals  Patient Position (if appropriate) Orthostatic Vitals  Orthostatic Lying   BP- Lying (!) 177/104  Pulse- Lying 80  Orthostatic Sitting  BP- Sitting (!) 158/97  Pulse- Sitting 81  Orthostatic Standing at 0 minutes  BP- Standing at 0 minutes 154/77  Pulse- Standing at 0 minutes 84  Orthostatic Standing at 3 minutes  BP- Standing at 3 minutes (!) 172/105  Pulse- Standing at 3 minutes 86   Rosezetta Schlatter, MD notified.

## 2022-09-19 NOTE — Progress Notes (Signed)
BP 179/117 MAP 136 HR 75 Pt asymptomatic and has no complaints at this time.  Rosezetta Schlatter, MD notified. See new order for lisinopril.

## 2022-09-20 DIAGNOSIS — E7800 Pure hypercholesterolemia, unspecified: Secondary | ICD-10-CM | POA: Diagnosis present

## 2022-09-20 DIAGNOSIS — E1022 Type 1 diabetes mellitus with diabetic chronic kidney disease: Secondary | ICD-10-CM | POA: Diagnosis present

## 2022-09-20 DIAGNOSIS — E10649 Type 1 diabetes mellitus with hypoglycemia without coma: Secondary | ICD-10-CM | POA: Diagnosis not present

## 2022-09-20 DIAGNOSIS — E871 Hypo-osmolality and hyponatremia: Secondary | ICD-10-CM | POA: Diagnosis present

## 2022-09-20 DIAGNOSIS — R55 Syncope and collapse: Secondary | ICD-10-CM | POA: Diagnosis present

## 2022-09-20 DIAGNOSIS — G9341 Metabolic encephalopathy: Secondary | ICD-10-CM | POA: Diagnosis present

## 2022-09-20 DIAGNOSIS — Z794 Long term (current) use of insulin: Secondary | ICD-10-CM | POA: Diagnosis not present

## 2022-09-20 DIAGNOSIS — Z7982 Long term (current) use of aspirin: Secondary | ICD-10-CM | POA: Diagnosis not present

## 2022-09-20 DIAGNOSIS — N179 Acute kidney failure, unspecified: Secondary | ICD-10-CM | POA: Diagnosis present

## 2022-09-20 DIAGNOSIS — C9000 Multiple myeloma not having achieved remission: Secondary | ICD-10-CM | POA: Diagnosis present

## 2022-09-20 DIAGNOSIS — D638 Anemia in other chronic diseases classified elsewhere: Secondary | ICD-10-CM | POA: Diagnosis present

## 2022-09-20 DIAGNOSIS — Z8249 Family history of ischemic heart disease and other diseases of the circulatory system: Secondary | ICD-10-CM | POA: Diagnosis not present

## 2022-09-20 DIAGNOSIS — Z89422 Acquired absence of other left toe(s): Secondary | ICD-10-CM | POA: Diagnosis not present

## 2022-09-20 DIAGNOSIS — I959 Hypotension, unspecified: Secondary | ICD-10-CM | POA: Diagnosis present

## 2022-09-20 DIAGNOSIS — E860 Dehydration: Secondary | ICD-10-CM | POA: Diagnosis present

## 2022-09-20 DIAGNOSIS — R9431 Abnormal electrocardiogram [ECG] [EKG]: Secondary | ICD-10-CM | POA: Diagnosis present

## 2022-09-20 DIAGNOSIS — E1065 Type 1 diabetes mellitus with hyperglycemia: Secondary | ICD-10-CM | POA: Diagnosis present

## 2022-09-20 DIAGNOSIS — I129 Hypertensive chronic kidney disease with stage 1 through stage 4 chronic kidney disease, or unspecified chronic kidney disease: Secondary | ICD-10-CM | POA: Diagnosis present

## 2022-09-20 DIAGNOSIS — F1210 Cannabis abuse, uncomplicated: Secondary | ICD-10-CM | POA: Diagnosis present

## 2022-09-20 DIAGNOSIS — Z79899 Other long term (current) drug therapy: Secondary | ICD-10-CM | POA: Diagnosis not present

## 2022-09-20 DIAGNOSIS — Z880 Allergy status to penicillin: Secondary | ICD-10-CM | POA: Diagnosis not present

## 2022-09-20 DIAGNOSIS — Z7902 Long term (current) use of antithrombotics/antiplatelets: Secondary | ICD-10-CM | POA: Diagnosis not present

## 2022-09-20 LAB — BASIC METABOLIC PANEL
Anion gap: 5 (ref 5–15)
BUN: 26 mg/dL — ABNORMAL HIGH (ref 8–23)
CO2: 23 mmol/L (ref 22–32)
Calcium: 8 mg/dL — ABNORMAL LOW (ref 8.9–10.3)
Chloride: 109 mmol/L (ref 98–111)
Creatinine, Ser: 1.16 mg/dL (ref 0.61–1.24)
GFR, Estimated: >60 mL/min (ref >60)
Glucose, Bld: 59 mg/dL — ABNORMAL LOW (ref 70–99)
Potassium: 3.7 mmol/L (ref 3.5–5.1)
Sodium: 137 mmol/L (ref 135–145)

## 2022-09-20 LAB — CBC WITH DIFFERENTIAL/PLATELET
Abs Immature Granulocytes: 0.01 10*3/uL (ref 0.00–0.07)
Basophils Absolute: 0 10*3/uL (ref 0.0–0.1)
Basophils Relative: 0 %
Eosinophils Absolute: 0.1 10*3/uL (ref 0.0–0.5)
Eosinophils Relative: 3 %
HCT: 22 % — ABNORMAL LOW (ref 39.0–52.0)
Hemoglobin: 7.3 g/dL — ABNORMAL LOW (ref 13.0–17.0)
Immature Granulocytes: 0 %
Lymphocytes Relative: 34 %
Lymphs Abs: 0.9 10*3/uL (ref 0.7–4.0)
MCH: 31.3 pg (ref 26.0–34.0)
MCHC: 33.2 g/dL (ref 30.0–36.0)
MCV: 94.4 fL (ref 80.0–100.0)
Monocytes Absolute: 0.2 10*3/uL (ref 0.1–1.0)
Monocytes Relative: 10 %
Neutro Abs: 1.4 10*3/uL — ABNORMAL LOW (ref 1.7–7.7)
Neutrophils Relative %: 53 %
Platelets: 195 10*3/uL (ref 150–400)
RBC: 2.33 MIL/uL — ABNORMAL LOW (ref 4.22–5.81)
RDW: 15.2 % (ref 11.5–15.5)
WBC: 2.5 10*3/uL — ABNORMAL LOW (ref 4.0–10.5)
nRBC: 0 % (ref 0.0–0.2)

## 2022-09-20 LAB — IRON AND TIBC
Iron: 32 ug/dL — ABNORMAL LOW (ref 45–182)
Saturation Ratios: 13 % — ABNORMAL LOW (ref 17.9–39.5)
TIBC: 239 ug/dL — ABNORMAL LOW (ref 250–450)
UIBC: 207 ug/dL

## 2022-09-20 LAB — FOLATE: Folate: 26 ng/mL (ref >5.9)

## 2022-09-20 LAB — FERRITIN: Ferritin: 19 ng/mL — ABNORMAL LOW (ref 24–336)

## 2022-09-20 LAB — GLUCOSE, CAPILLARY: Glucose-Capillary: 245 mg/dL — ABNORMAL HIGH (ref 70–99)

## 2022-09-20 LAB — VITAMIN B12: Vitamin B-12: 736 pg/mL (ref 180–914)

## 2022-09-20 MED ORDER — ENSURE ENLIVE PO LIQD
237.0000 mL | Freq: Two times a day (BID) | ORAL | Status: DC
  Administered 2022-09-20: 237 mL via ORAL

## 2022-09-20 MED ORDER — PANTOPRAZOLE SODIUM 40 MG PO TBEC: 40.0000 mg | DELAYED_RELEASE_TABLET | Freq: Two times a day (BID) | ORAL | Status: DC

## 2022-09-20 NOTE — TOC Transition Note (Signed)
Transition of Care [facility]) - CM/SW Discharge Note   Patient Details  Name: Nicholas Weiss MRN: [id number] Date of Birth: Sep 07, 1950  [facility]) CM/SW Contact:  Bing Quarry, RN Phone Number: 09/20/2022, 2:35 PM   Clinical Narrative:  6/23: [facility] ED via EMS with an unwitnessed fall earlier in the day and with concerns for lethargy. On arrival of EMS BP was 81/49, htx of substance use and marijuana. Thinks he passed out after standing and being dizzy.   TOC consult for HH/DME/SNF potential with PT/OT evals ordered but not yet completed as Unit RN caring for patient documented that "Patient walked around nursing station 5 times with no difficulty, no shortness of breath." At 1132 am. Discharge to home/self care today. No other TOC needs identified on discharge orders. TOC will sign off. Please contact if further needs arise. Patient has a PCP.  Gabriel Cirri RN CM Transitions of Care Department Weekends Only [phone]       Final next level of care: Home/Self Care Barriers to Discharge: Barriers Resolved   Patient Goals and CMS Choice      Discharge Placement                         Discharge Plan and Services Additional resources added to the After Visit Summary for                  DME Arranged: N/A DME Agency: NA       HH Arranged: NA HH Agency: NA        Social Determinants of Health (SDOH) Interventions SDOH Screenings   Food Insecurity: No Food Insecurity (09/19/2022)  Housing: Low Risk  (09/19/2022)  Transportation Needs: No Transportation Needs (09/19/2022)  Utilities: Not At Risk (09/19/2022)  Alcohol Screen: Low Risk  (10/02/2020)  Depression (PHQ2-9): Medium Risk (10/02/2020)  Financial Resource Strain: Low Risk  (01/01/2020)  Physical Activity: Insufficiently Active (01/01/2020)  Social Connections: Moderately Isolated (01/01/2020)  Stress: No Stress Concern Present (01/01/2020)  Tobacco Use: Low Risk  (09/18/2022)     Readmission Risk  Interventions    08/24/2022   10:57 AM 05/20/2022   11:41 AM 03/12/2022   10:22 AM  Readmission Risk Prevention Plan  Transportation Screening Complete Complete Complete  Medication Review Oceanographer) Complete Complete Complete  PCP or Specialist appointment within 3-5 days of discharge Complete Complete Complete  HRI or Home Care Consult Complete Complete Complete  SW Recovery Care/Counseling Consult   Complete  Palliative Care Screening  Complete Not Applicable  Skilled Nursing Facility Complete Complete Not Applicable

## 2022-09-20 NOTE — Progress Notes (Signed)
Discharge instructions given. No concerns voiced. Pt's home med returned to pt. Currently in room awaiting ride.

## 2022-09-20 NOTE — Discharge Summary (Signed)
Physician Discharge Summary   Patient: Nicholas Weiss MRN: [id number] DOB: 17-Dec-1950  Admit date:     09/18/2022  Discharge date: 09/20/22  Discharge Physician: Loyce Dys   PCP: Marisue Ivan, MD    Discharge Diagnoses: Syncope and collapse, recurrent Hypotension-improved History of orthostatic hypotension, previously on midodrine and Florinef Acute metabolic encephalopathy AKI (acute kidney injury) (HCC) secondary to dehydration Uncontrolled type 1 diabetes mellitus with hyperglycemia, with long-term current use of insulin (HCC) Symptomatic anemia History of GI bleed/melena 05/2022, EGD with erosive gastropathy Hyponatremia-improving Multiple myeloma (HCC) Substance abuse [facility])   Hospital Course: Nicholas Weiss is a 72 y.o. male with medical history significant for Type 1 diabetes, HTN, cocaine and marijuana use, HTN, multiple myeloma on chemotherapy, last treatment 6/11, chronic anemia on EPO, history of GI bleed 05/2022 with EGD showing erosive gastropathy, orthostatic hypotension previously on midodrine and Florinef, hospitalized a month ago from 5/21 to 08/26/2022 with a presyncopal episode attributed to dehydration, orthostatic hypotension and drug use, with discontinuation of home antihypertensives at discharge, who was brought in by EMS with an unwitnessed fall earlier in the day and with concerns for lethargy.  On arrival of EMS BP was 81/49, fluid responsive to 98/50 with 500 mL bolus en route.  Patient was somnolent on arrival to the ED.  Patient endorses smoking marijuana prior to the episode.  He thinks he might have passed out.  He denies nausea or vomiting, black or bloody stool, abdominal pain.  History limited due to somnolence ED course and dataReview: BP 102/62 by arrival with otherwise normal vitals VBG unremarkable with pH 7.39 and pCO2 42.  CBC notable for WBC of 2.6 and hemoglobin 7.4 down from 8.0 on 6/11.  Sodium 128, glucose 242, creatinine 1.91, up from  1.14 about 3 weeks prior.  Lipase and LFTs normal.  Lactic acid 1.6, troponin 15." During hospitalization patient was found to be severely dehydrated requiring IV fluid therapy with improvement in renal function.  Patient was walked around with no complaints of dizziness and was sent back to his baseline.  He was noted to have some hypoglycemia for which patient was counseled on how to monitor his glucose at home.  Patient also found to have hemoglobin in the low sevens but with no active bleeding and therefore instructed to follow-up with primary care physician for his routine colonoscopy screening as needed.  According to patient he has had a recent colonoscopy couple of years ago that was normal.  Consultants: None Procedures performed: None Disposition: Home Diet recommendation:  Discharge Diet Orders (From admission, onward)     Start     Ordered   09/20/22 0000  Diet - low sodium heart healthy        09/20/22 1353           Cardiac diet DISCHARGE MEDICATION: Allergies as of 09/20/2022       Reactions   Penicillins Other (See Comments)   Childhood allergy -tolerated amoxil 03/2022 Unknown reaction Has patient had a PCN reaction causing immediate rash, facial/tongue/throat swelling, SOB or lightheadedness with hypotension: No Has patient had a PCN reaction causing severe rash involving mucus membranes or skin necrosis: No Has patient had a PCN reaction that required hospitalization No Has patient had a PCN reaction occurring within the last 10 years: No If all of the above answers are "NO", then may proceed with Cephalosporin use.        Medication List     STOP taking these  medications    clopidogrel 75 MG tablet Commonly known as: PLAVIX   lisinopril 2.5 MG tablet Commonly known as: ZESTRIL       TAKE these medications    acetaminophen 500 MG tablet Commonly known as: TYLENOL Take 1,000 mg by mouth daily as needed for mild pain or moderate pain.   acyclovir  400 MG tablet Commonly known as: ZOVIRAX Take 1 tablet (400 mg total) by mouth 2 (two) times daily.   ascorbic acid 500 MG tablet Commonly known as: VITAMIN C Take 1 tablet (500 mg total) by mouth daily.   aspirin EC 81 MG tablet Take 81 mg by mouth in the morning.   atorvastatin 80 MG tablet Commonly known as: LIPITOR Take 1 tablet (80 mg total) by mouth at bedtime. Hold while taking Paxlovid   Baclofen 5 MG Tabs Take 5 mg by mouth daily as needed (muscle spasm).   Baqsimi One Pack 3 MG/DOSE Powd Generic drug: Glucagon Place 3 mg into the nose once as needed for up to 1 dose (for low sugar).   Dexcom G7 Sensor Misc Use 1 each every 10 (ten) days   docusate sodium 100 MG capsule Commonly known as: COLACE Take 100 mg by mouth daily as needed for mild constipation.   feeding supplement (GLUCERNA SHAKE) Liqd Take 237 mLs by mouth 3 (three) times daily between meals.   lenalidomide 10 MG capsule Commonly known as: REVLIMID Take 1 capsule (10 mg total) by mouth daily. Take for 14 days, then hold for 7 days. Repeat every 21 days.   multivitamin with minerals Tabs tablet Take 1 tablet by mouth daily.   NovoLOG 100 UNIT/ML injection Generic drug: insulin aspart Sliding Scale: Glucose 70 - 120: 0 units  Glucose 121 - 150: 1 unit  Glucose 151 - 200: 2 units Glucose 201 - 250: 3 units  Glucose 251 - 300: 5 units  Glucose 301 - 350: 7 units  Glucose 351 - 400: 9 units  Glucose > 400: call your docotr   ondansetron 8 MG tablet Commonly known as: ZOFRAN Take 8 mg by mouth daily as needed for nausea or vomiting.   pantoprazole 40 MG tablet Commonly known as: Protonix Take 1 tablet (40 mg total) by mouth 2 (two) times daily. Take twice daily for 2 months then decrease to once daily   Tresiba FlexTouch 100 UNIT/ML FlexTouch Pen Generic drug: insulin degludec Inject 8 Units into the skin daily.        Discharge Exam: Filed Weights   09/18/22 2312 09/19/22 1544   Weight: 70.7 kg 72.2 kg   Constitutional:      General: In no acute distress HENT:     Head: Normocephalic and atraumatic.  Cardiovascular: Normal rate and rhythm    Heart sounds: Normal heart sounds.  Pulmonary:     Effort: Pulmonary effort is normal.     Breath sounds: Normal breath sounds.  Abdominal:     Palpations: Abdomen is soft.     Tenderness: There is no abdominal tenderness.   Condition at discharge: good   Discharge time spent: 39 minutes  Signed: Loyce Dys, MD [facility] 09/20/2022

## 2022-09-20 NOTE — Progress Notes (Signed)
Patient walked around nursing station 5 times with no difficulty, no shortness of breath.

## 2022-09-20 NOTE — Progress Notes (Signed)
Pt discharged to home. Left unit in wheelchair pushed by hospital volunteer. Left in stable condition.  

## 2022-09-22 ENCOUNTER — Inpatient Hospital Stay: Payer: 59

## 2022-09-22 ENCOUNTER — Other Ambulatory Visit: Payer: Self-pay | Admitting: Nurse Practitioner

## 2022-09-22 ENCOUNTER — Other Ambulatory Visit: Payer: Self-pay | Admitting: Internal Medicine

## 2022-09-22 ENCOUNTER — Telehealth: Payer: Self-pay | Admitting: Oncology

## 2022-09-22 ENCOUNTER — Inpatient Hospital Stay: Payer: 59 | Admitting: Internal Medicine

## 2022-09-22 DIAGNOSIS — C9000 Multiple myeloma not having achieved remission: Secondary | ICD-10-CM

## 2022-09-22 DIAGNOSIS — Z5112 Encounter for antineoplastic immunotherapy: Secondary | ICD-10-CM | POA: Diagnosis not present

## 2022-09-22 LAB — CBC WITH DIFFERENTIAL/PLATELET
Abs Immature Granulocytes: 0.02 10*3/uL (ref 0.00–0.07)
Basophils Absolute: 0 10*3/uL (ref 0.0–0.1)
Basophils Relative: 0 %
Eosinophils Absolute: 0.1 10*3/uL (ref 0.0–0.5)
Eosinophils Relative: 5 %
HCT: 24.6 % — ABNORMAL LOW (ref 39.0–52.0)
Hemoglobin: 8 g/dL — ABNORMAL LOW (ref 13.0–17.0)
Immature Granulocytes: 1 %
Lymphocytes Relative: 25 %
Lymphs Abs: 0.7 10*3/uL (ref 0.7–4.0)
MCH: 31 pg (ref 26.0–34.0)
MCHC: 32.5 g/dL (ref 30.0–36.0)
MCV: 95.3 fL (ref 80.0–100.0)
Monocytes Absolute: 0.1 10*3/uL (ref 0.1–1.0)
Monocytes Relative: 5 %
Neutro Abs: 1.7 10*3/uL (ref 1.7–7.7)
Neutrophils Relative %: 64 %
Platelets: 198 10*3/uL (ref 150–400)
RBC: 2.58 MIL/uL — ABNORMAL LOW (ref 4.22–5.81)
RDW: 15.4 % (ref 11.5–15.5)
WBC: 2.7 10*3/uL — ABNORMAL LOW (ref 4.0–10.5)
nRBC: 0 % (ref 0.0–0.2)

## 2022-09-22 LAB — COMPREHENSIVE METABOLIC PANEL
ALT: 24 U/L (ref 0–44)
AST: 19 U/L (ref 15–41)
Albumin: 3.1 g/dL — ABNORMAL LOW (ref 3.5–5.0)
Alkaline Phosphatase: 52 U/L (ref 38–126)
Anion gap: 7 (ref 5–15)
BUN: 26 mg/dL — ABNORMAL HIGH (ref 8–23)
CO2: 24 mmol/L (ref 22–32)
Calcium: 8.3 mg/dL — ABNORMAL LOW (ref 8.9–10.3)
Chloride: 104 mmol/L (ref 98–111)
Creatinine, Ser: 1.44 mg/dL — ABNORMAL HIGH (ref 0.61–1.24)
GFR, Estimated: 53 mL/min — ABNORMAL LOW (ref >60)
Glucose, Bld: 127 mg/dL — ABNORMAL HIGH (ref 70–99)
Potassium: 4.3 mmol/L (ref 3.5–5.1)
Sodium: 135 mmol/L (ref 135–145)
Total Bilirubin: 0.5 mg/dL (ref 0.3–1.2)
Total Protein: 7.4 g/dL (ref 6.5–8.1)

## 2022-09-22 NOTE — Progress Notes (Signed)
Appointments shifted to Friday d/t missed appointment. Patient will see Dr Alena Bills for consideration of C2D1 of DaraVRd chemotherapy.

## 2022-09-22 NOTE — Telephone Encounter (Signed)
Pt called and stated that he missed his appt from this morning. He would like to reschedule, beacon requests would need to be updated.

## 2022-09-23 ENCOUNTER — Other Ambulatory Visit: Payer: Self-pay

## 2022-09-25 ENCOUNTER — Other Ambulatory Visit: Payer: Self-pay

## 2022-09-25 ENCOUNTER — Emergency Department
Admission: EM | Admit: 2022-09-25 | Discharge: 2022-09-25 | Disposition: A | Discharge status: Home / Self Care | Payer: 59 | Attending: Emergency Medicine | Admitting: Emergency Medicine

## 2022-09-25 ENCOUNTER — Inpatient Hospital Stay (HOSPITAL_BASED_OUTPATIENT_CLINIC_OR_DEPARTMENT_OTHER): Payer: 59 | Admitting: Internal Medicine

## 2022-09-25 ENCOUNTER — Inpatient Hospital Stay: Payer: 59

## 2022-09-25 DIAGNOSIS — Z794 Long term (current) use of insulin: Secondary | ICD-10-CM | POA: Diagnosis not present

## 2022-09-25 DIAGNOSIS — E1022 Type 1 diabetes mellitus with diabetic chronic kidney disease: Secondary | ICD-10-CM | POA: Insufficient documentation

## 2022-09-25 DIAGNOSIS — Z5112 Encounter for antineoplastic immunotherapy: Secondary | ICD-10-CM | POA: Diagnosis not present

## 2022-09-25 DIAGNOSIS — Z7982 Long term (current) use of aspirin: Secondary | ICD-10-CM | POA: Insufficient documentation

## 2022-09-25 DIAGNOSIS — R2233 Localized swelling, mass and lump, upper limb, bilateral: Secondary | ICD-10-CM | POA: Insufficient documentation

## 2022-09-25 DIAGNOSIS — C9000 Multiple myeloma not having achieved remission: Secondary | ICD-10-CM | POA: Diagnosis not present

## 2022-09-25 DIAGNOSIS — M7989 Other specified soft tissue disorders: Secondary | ICD-10-CM

## 2022-09-25 DIAGNOSIS — N189 Chronic kidney disease, unspecified: Secondary | ICD-10-CM | POA: Insufficient documentation

## 2022-09-25 DIAGNOSIS — Z8579 Personal history of other malignant neoplasms of lymphoid, hematopoietic and related tissues: Secondary | ICD-10-CM | POA: Diagnosis not present

## 2022-09-25 MED ORDER — BORTEZOMIB CHEMO SQ INJECTION 3.5 MG (2.5MG/ML)
1.3000 mg/m2 | Freq: Once | INTRAMUSCULAR | Status: AC
  Administered 2022-09-25: 2.5 mg via SUBCUTANEOUS
  Filled 2022-09-25: qty 1

## 2022-09-25 MED ORDER — DEXAMETHASONE 4 MG PO TABS
10.0000 mg | ORAL_TABLET | Freq: Once | ORAL | Status: AC
  Administered 2022-09-25: 10 mg via ORAL
  Filled 2022-09-25: qty 3

## 2022-09-25 MED ORDER — DARATUMUMAB-HYALURONIDASE-FIHJ 1800-30000 MG-UT/15ML ~~LOC~~ SOLN
1800.0000 mg | Freq: Once | SUBCUTANEOUS | Status: AC
  Administered 2022-09-25: 1800 mg via SUBCUTANEOUS
  Filled 2022-09-25: qty 15

## 2022-09-25 MED ORDER — ACETAMINOPHEN 325 MG PO TABS
650.0000 mg | ORAL_TABLET | Freq: Once | ORAL | Status: AC
  Administered 2022-09-25: 650 mg via ORAL
  Filled 2022-09-25: qty 2

## 2022-09-25 MED ORDER — DIPHENHYDRAMINE HCL 25 MG PO CAPS
50.0000 mg | ORAL_CAPSULE | Freq: Once | ORAL | Status: AC
  Administered 2022-09-25: 50 mg via ORAL
  Filled 2022-09-25: qty 2

## 2022-09-25 NOTE — Progress Notes (Signed)
Patient is having both hands are swollen, and he is struggling with dizziness severely.

## 2022-09-25 NOTE — ED Triage Notes (Signed)
Pt to ed from home via POV for arm swelling x 2 days. Nurse assessed arms and no swelling noted in triage. Pt is CAOx4, in no acute distress and ambulatory in triage. Pt was just seen recently for some diabetic issues that led to a fall and was DC home. Pt switched his insulin this week and thinks it might be related to switching insulin?

## 2022-09-25 NOTE — Progress Notes (Signed)
Hematology/Oncology Consult note [facility]  Telephone:([phone] [phone]  Patient Care Team: Marisue Ivan, MD as PCP - General (Family Medicine) [place], [facility] (Optometry) Creig Hines, MD as Consulting Physician (Oncology)   Name of the patient: Nicholas Weiss  [id number]  08/31/1950   Date of visit: 09/25/22  Diagnosis- high risk IgG lambda multiple myeloma R-ISS stage III     Chief complaint/ Reason for visit-on treatment assessment prior to cycle 2 day 1 of D VRD chemotherapy  Heme/Onc history: Patient is a 72 year old African-American male with a past medical history significant for uncontrolled type 1 diabetes, chronic kidney disease stage III chronic normocytic anemia referred for abnormal SPEP. Patient's creatinine has been fluctuating between 1.5-2 but about 5 months ago it went up all the way to 5 and his blood sugars were in the 1000 range. More recently his kidney numbers have been drifting back to normal values. As a part of the work-up he had serum protein electrophoresis done which showed an elevated gammaglobulin fraction with an M spike of 3%. The amount of M spike was not quantified in the specimen. He has therefore been referred to [place] for further management. Patient endorses chronic fatigue reports that his appetite and weight have remained stable   Results of blood work from 04/08/2020 were as follows: CBC showed white count of 2.9, H&H of 10.5/31.2 with an MCV of 91 and a platelet count of 191.  CMP showed a mildly elevated creatinine of 1.2 which was better as compared to 5 months ago when it was 3.9.  Total protein was mildly elevated at 9.1 calcium normal at 8.4 ferritin and iron studies B12 folate TSH and haptoglobin were normal.  Myeloma panel revealed an elevated IgG level of 06/04/2004 with an M protein of 3.1 g.  Immunofixation showed IgG lambda specificity.  Serum free light chain ratio was elevated at 25 and  free light chain lambda elevated at 370   Further myeloma work-up including a PET CT scan did not reveal any evidence of edematous lesions.  Bone marrow biopsy showed 23% plasma cells by manual count and 30% by CD138 IHC.  Normal cytogenetics.  FISH studies for myeloma showed gain of 1 q.  13 q-. detected.  P53 not detected.   He was treated for a year and has having smoldering multiple myeloma since both CKD and anemia have been stable for 3 to 4 years and could be secondary to uncontrolled diabetes.In June 2023 IgG levels increased to 5463 with an M protein of 4.1 g as compared to 2.6 g in September 2022.  Serum free light chain ratio remains around 25.  Therefore a repeat bone marrow biopsy was done which shows further increase in plasma cells ranging from 30 to 70% and by immunohistochemistry 60 to 70% of the cells were positive for CD138.  Cytogenetics normal.  FISH study showed 4: 14 translocation and gain of 1 q. making this high risk RISS stage III.  Repeat PET scan showed no lytic lesions   Patient is not a transplant candidate and was started on Velcade Revlimid dexamethasone regimen in July 2023.  Treatment complicated by repeated hospitalizations for uncontrolled blood sugars.  He has been frequently hospitalized thereby causing multiple interruptions in his treatment  Interval history- Patient had multiple treatment interruptions due to ED visits.  He was recently in ED on June 21 after a syncopal episode.  Had been using marijuana.  After a while  he got up from the chair got lightheaded and passed out.  CT head was negative for any acute event.  He has puffiness in both his hands with started a day or 2 ago.  Tells me the only thing he did different was started meal short acting insulin.  ECOG PS- 2 Pain scale- 0   Review of systems- Review of Systems  Constitutional:  Positive for malaise/fatigue. Negative for chills, fever and weight loss.  HENT:  Negative for congestion, ear  discharge and nosebleeds.   Eyes:  Negative for blurred vision.  Respiratory:  Negative for cough, hemoptysis, sputum production, shortness of breath and wheezing.   Cardiovascular:  Negative for chest pain, palpitations, orthopnea and claudication.  Gastrointestinal:  Negative for abdominal pain, blood in stool, constipation, diarrhea, heartburn, melena, nausea and vomiting.  Genitourinary:  Negative for dysuria, flank pain, frequency, hematuria and urgency.  Musculoskeletal:  Negative for back pain, joint pain and myalgias.  Skin:  Negative for rash.  Neurological:  Positive for dizziness. Negative for tingling, focal weakness, seizures, weakness and headaches.  Endo/Heme/Allergies:  Does not bruise/bleed easily.  Psychiatric/Behavioral:  Negative for depression and suicidal ideas. The patient does not have insomnia.       Allergies  Allergen Reactions   Penicillins Other (See Comments)    Childhood allergy -tolerated amoxil 03/2022 Unknown reaction Has patient had a PCN reaction causing immediate rash, facial/tongue/throat swelling, SOB or lightheadedness with hypotension: No Has patient had a PCN reaction causing severe rash involving mucus membranes or skin necrosis: No Has patient had a PCN reaction that required hospitalization No Has patient had a PCN reaction occurring within the last 10 years: No If all of the above answers are "NO", then may proceed with Cephalosporin use.      Past Medical History:  Diagnosis Date   Acute metabolic encephalopathy 12/02/2020   DKA (diabetic ketoacidoses) 04/06/2016   Hypercholesteremia    Hypertension      Past Surgical History:  Procedure Laterality Date   AMPUTATION TOE Left 04/25/2022   Procedure: LEFT GREAT TOE AMPUTATION AND LEFT PARTIAL 2ND TOE AMPUTATION;  Surgeon: Felecia Shelling, DPM;  Location: [facility] ORS;  Service: Podiatry;  Laterality: Left;   COLONOSCOPY WITH PROPOFOL N/A 02/01/2020   Procedure: COLONOSCOPY WITH PROPOFOL;   Surgeon: Toney Reil, MD;  Location: [facility] ENDOSCOPY;  Service: Gastroenterology;  Laterality: N/A;   ESOPHAGOGASTRODUODENOSCOPY  02/01/2020   Procedure: ESOPHAGOGASTRODUODENOSCOPY (EGD);  Surgeon: Toney Reil, MD;  Location: [facility] ENDOSCOPY;  Service: Gastroenterology;;   ESOPHAGOGASTRODUODENOSCOPY (EGD) WITH PROPOFOL N/A 06/07/2022   Procedure: ESOPHAGOGASTRODUODENOSCOPY (EGD) WITH PROPOFOL;  Surgeon: Kerin Salen, MD;  Location: [facility] ENDOSCOPY;  Service: Gastroenterology;  Laterality: N/A;   KNEE SURGERY Right    Torn meniscus   KNEE SURGERY Left     Social History   Socioeconomic History   Marital status: Single    Spouse name: Not on file   Number of children: 3   Years of education: Not on file   Highest education level: High school graduate  Occupational History    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  Tobacco Use   Smoking status: Never   Smokeless tobacco: Never  Vaping Use   Vaping Use: Never used  Substance and Sexual Activity   Alcohol use: Not Currently    Alcohol/week: 0.0 - 1.0 standard drinks of alcohol    Comment: "once every 2 months"   Drug use: Yes    Types: Marijuana, "Crack" cocaine  Comment: last week   Sexual activity: Not Currently    Birth control/protection: None  Other Topics Concern   Not on file  Social History Narrative   Lives with girlfriend "sharon"   Social Determinants of Health   Financial Resource Strain: Low Risk  (01/01/2020)   Overall Financial Resource Strain (CARDIA)    Difficulty of Paying Living Expenses: Not hard at all  Food Insecurity: No Food Insecurity (09/19/2022)   Hunger Vital Sign    Worried About Running Out of Food in the Last Year: Never true    Ran Out of Food in the Last Year: Never true  Transportation Needs: No Transportation Needs (09/19/2022)   PRAPARE - Transportation    Lack of Transportation (Medical): No    Lack of Transportation (Non-Medical): No  Physical Activity: Insufficiently Active  (01/01/2020)   Exercise Vital Sign    Days of Exercise per Week: 7 days    Minutes of Exercise per Session: 20 min  Stress: No Stress Concern Present (01/01/2020)   [employer] of Occupational Health - Occupational Stress Questionnaire    Feeling of Stress : Not at all  Social Connections: Moderately Isolated (01/01/2020)   Social Connection and Isolation Panel [NHANES]    Frequency of Communication with Friends and Family: More than three times a week    Frequency of Social Gatherings with Friends and Family: More than three times a week    Attends Religious Services: Never    Database administrator or Organizations: No    Attends Banker Meetings: Never    Marital Status: Living with partner  Intimate Partner Violence: Not At Risk (09/19/2022)   Humiliation, Afraid, Rape, and Kick questionnaire    Fear of Current or Ex-Partner: No    Emotionally Abused: No    Physically Abused: No    Sexually Abused: No    Family History  Problem Relation Age of Onset   Heart attack Father    Hypertension Sister    Cancer Sister      Current Outpatient Medications:    acetaminophen (TYLENOL) 500 MG tablet, Take 1,000 mg by mouth daily as needed for mild pain or moderate pain., Disp: , Rfl:    acyclovir (ZOVIRAX) 400 MG tablet, Take 1 tablet (400 mg total) by mouth 2 (two) times daily., Disp: 60 tablet, Rfl: 2   ascorbic acid (VITAMIN C) 500 MG tablet, Take 1 tablet (500 mg total) by mouth daily., Disp: 90 tablet, Rfl: 0   aspirin EC 81 MG tablet, Take 81 mg by mouth in the morning., Disp: , Rfl:    atorvastatin (LIPITOR) 80 MG tablet, Take 1 tablet (80 mg total) by mouth at bedtime. Hold while taking Paxlovid, Disp: 30 tablet, Rfl: 2   Baclofen 5 MG TABS, Take 5 mg by mouth daily as needed (muscle spasm)., Disp: , Rfl:    Continuous Glucose Sensor (DEXCOM G7 SENSOR) MISC, Use 1 each every 10 (ten) days, Disp: , Rfl:    docusate sodium (COLACE) 100 MG capsule, Take 100 mg by  mouth daily as needed for mild constipation., Disp: , Rfl:    feeding supplement, GLUCERNA SHAKE, (GLUCERNA SHAKE) LIQD, Take 237 mLs by mouth 3 (three) times daily between meals., Disp: , Rfl: 0   Glucagon (BAQSIMI ONE PACK) 3 MG/DOSE POWD, Place 3 mg into the nose once as needed for up to 1 dose (for low sugar)., Disp: 1 each, Rfl: 1   insulin degludec (TRESIBA FLEXTOUCH) 100 UNIT/ML FlexTouch  Pen, Inject 8 Units into the skin daily., Disp: , Rfl:    lenalidomide (REVLIMID) 10 MG capsule, Take 1 capsule (10 mg total) by mouth daily. Take for 14 days, then hold for 7 days. Repeat every 21 days., Disp: 14 capsule, Rfl: 0   Multiple Vitamin (MULTIVITAMIN WITH MINERALS) TABS tablet, Take 1 tablet by mouth daily., Disp: 90 tablet, Rfl: 1   NOVOLOG 100 UNIT/ML injection, Sliding Scale: Glucose 70 - 120: 0 units  Glucose 121 - 150: 1 unit  Glucose 151 - 200: 2 units Glucose 201 - 250: 3 units  Glucose 251 - 300: 5 units  Glucose 301 - 350: 7 units  Glucose 351 - 400: 9 units  Glucose > 400: call your docotr, Disp: 10 mL, Rfl: 1   ondansetron (ZOFRAN) 8 MG tablet, Take 8 mg by mouth daily as needed for nausea or vomiting., Disp: , Rfl:    pantoprazole (PROTONIX) 40 MG tablet, Take 1 tablet (40 mg total) by mouth 2 (two) times daily. Take twice daily for 2 months then decrease to once daily, Disp: , Rfl:   Physical exam:  Vitals:   09/25/22 0954  BP: (!) 104/91  Pulse: 87  Temp: (!) 93.3 F (34.1 C)  TempSrc: Tympanic  SpO2: 100%  Weight: 164 lb 3.2 oz (74.5 kg)   Physical Exam Cardiovascular:     Rate and Rhythm: Normal rate and regular rhythm.     Heart sounds: Normal heart sounds.  Pulmonary:     Effort: Pulmonary effort is normal.     Breath sounds: Normal breath sounds.  Abdominal:     General: Bowel sounds are normal.     Palpations: Abdomen is soft.  Musculoskeletal:     Right lower leg: No edema.     Left lower leg: No edema.  Skin:    General: Skin is warm and dry.   Neurological:     Mental Status: He is alert and oriented to person, place, and time.         Latest Ref Rng & Units 09/22/2022    1:10 PM  CMP  Glucose 70 - 99 mg/dL 161   BUN 8 - 23 mg/dL 26   Creatinine 0.96 - 1.24 mg/dL 0.45   Sodium 409 - 811 mmol/L 135   Potassium 3.5 - 5.1 mmol/L 4.3   Chloride 98 - 111 mmol/L 104   CO2 22 - 32 mmol/L 24   Calcium 8.9 - 10.3 mg/dL 8.3   Total Protein 6.5 - 8.1 g/dL 7.4   Total Bilirubin 0.3 - 1.2 mg/dL 0.5   Alkaline Phos 38 - 126 U/L 52   AST 15 - 41 U/L 19   ALT 0 - 44 U/L 24       Latest Ref Rng & Units 09/22/2022    1:10 PM  CBC  WBC 4.0 - 10.5 K/uL 2.7   Hemoglobin 13.0 - 17.0 g/dL 8.0   Hematocrit 91.4 - 52.0 % 24.6   Platelets 150 - 400 K/uL 198     No images are attached to the encounter.  CT Head Wo Contrast  Result Date: 09/18/2022 CLINICAL DATA:  Weakness, fell, altered level of consciousness EXAM: CT HEAD WITHOUT CONTRAST TECHNIQUE: Contiguous axial images were obtained from the base of the skull through the vertex without intravenous contrast. RADIATION DOSE REDUCTION: This exam was performed according to the departmental dose-optimization program which includes automated exposure control, adjustment of the mA and/or kV according to patient size and/or  use of iterative reconstruction technique. COMPARISON:  05/15/2022 FINDINGS: Brain: No acute infarct or hemorrhage. Lateral ventricles and midline structures are unremarkable. No acute extra-axial fluid collections. No mass effect. Diffuse coarse benign-appearing dural calcifications unchanged. Vascular: No hyperdense vessel or unexpected calcification. Skull: Normal. Negative for fracture or focal lesion. Sinuses/Orbits: No acute finding. Other: None. IMPRESSION: 1. No acute intracranial process. Electronically Signed   By: Sharlet Salina M.D.   On: 09/18/2022 19:57   DG Chest Portable 1 View  Result Date: 09/18/2022 CLINICAL DATA:  Altered mental status EXAM: PORTABLE  CHEST 1 VIEW COMPARISON:  08/18/2022 FINDINGS: The heart size and mediastinal contours are within normal limits. Aortic atherosclerosis. Streaky atelectasis left lung base. Both lungs are clear. The visualized skeletal structures are unremarkable. IMPRESSION: Streaky atelectasis at the left base. Electronically Signed   By: Jasmine Pang M.D.   On: 09/18/2022 19:53     Assessment and plan- Patient is a 72 y.o. male with R-ISS stage III high risk IgG lambda multiple myeloma.  He is here for on treatment assessment prior to cycle 2 day 1 of the Dara-RVD chemotherapy  Patient was in the hospital on June 21 with a syncopal episode which happened after marijuana use.  CT head was negative for any acute events.  He was scheduled for treatment on Tuesday but arrived late.  Did not have room and infusion then.  Rescheduled today.  Labs reviewed.  Creatinine 1.4.  Has been fluctuating.  CBC is stable.  Will proceed with daratumumab and bortezomib.  He is taking Revlimid 10 mg daily.  Started on June 24.  Anemia of chronic kidney disease: He will receive Retacrit today and we will continue that every 3 weeks  Patient will continue with aspirin and acyclovir prophylaxis.  He does not have any known areas of bone involvement and he is not on any bisphosphonates presently.   Visit Diagnosis 1. Multiple myeloma not having achieved remission (HCC)      09/25/2022 3:20 PM

## 2022-09-25 NOTE — Patient Instructions (Signed)
[facility]  Discharge Instructions: Thank you for choosing [facility] to provide your oncology and hematology care.  If you have a lab appointment with the Cancer Center, please go directly to the Cancer Center and check in at the registration area.  Wear comfortable clothing and clothing appropriate for easy access to any Portacath or PICC line.   We strive to give you quality time with your provider. You may need to reschedule your appointment if you arrive late (15 or more minutes).  Arriving late affects you and other patients whose appointments are after yours.  Also, if you miss three or more appointments without notifying the office, you may be dismissed from the clinic at the provider's discretion.      For prescription refill requests, have your pharmacy contact our office and allow 72 hours for refills to be completed.     To help prevent nausea and vomiting after your treatment, we encourage you to take your nausea medication as directed.  BELOW ARE SYMPTOMS THAT SHOULD BE REPORTED IMMEDIATELY: *FEVER GREATER THAN 100.4 F (38 C) OR HIGHER *CHILLS OR SWEATING *NAUSEA AND VOMITING THAT IS NOT CONTROLLED WITH YOUR NAUSEA MEDICATION *UNUSUAL SHORTNESS OF BREATH *UNUSUAL BRUISING OR BLEEDING *URINARY PROBLEMS (pain or burning when urinating, or frequent urination) *BOWEL PROBLEMS (unusual diarrhea, constipation, pain near the anus) TENDERNESS IN MOUTH AND THROAT WITH OR WITHOUT PRESENCE OF ULCERS (sore throat, sores in mouth, or a toothache) UNUSUAL RASH, SWELLING OR PAIN  UNUSUAL VAGINAL DISCHARGE OR ITCHING   Items with * indicate a potential emergency and should be followed up as soon as possible or go to the Emergency Department if any problems should occur.  Please show the CHEMOTHERAPY ALERT CARD or IMMUNOTHERAPY ALERT CARD at check-in to the Emergency Department and triage nurse.  Should you have questions after your visit  or need to cancel or reschedule your appointment, please contact [facility]  [phone] and follow the prompts.  Office hours are 8:00 a.m. to 4:30 p.m. Monday - Friday. Please note that voicemails left after 4:00 p.m. may not be returned until the following business day.  We are closed weekends and major holidays. You have access to a nurse at all times for urgent questions. Please call the main number to the clinic [phone] and follow the prompts.  For any non-urgent questions, you may also contact your provider using MyChart. We now offer e-Visits for anyone 18 and older to request care online for non-urgent symptoms. For details visit [email or web address].   Also download the MyChart app! Go to the app store, search "MyChart", open the app, select [facility], and log in with your MyChart username and password.    

## 2022-09-25 NOTE — Discharge Instructions (Signed)
Continue to monitor hands, return to the ER for any severe pain, warmth, redness, fevers, numbness, tingling, worsening symptoms or any urgent changes in your health.

## 2022-09-25 NOTE — ED Provider Notes (Signed)
[place] [facility] Provider Note   CSN: [id number] Arrival date & time: 09/25/22  1715     History  Chief Complaint  Patient presents with   Arm Swelling    Bilateral x 2 days    Nicholas Weiss is a 72 y.o. male.  With history of type 1 diabetes, chronic kidney disease, multiple myeloma presents to the emergency department for evaluation of bilateral hand swelling.  Patient states couple days ago he noticed some swelling throughout his hand and digits.  No warmth redness fevers or itching.  No rashes.  He discussed with PCP who is out of town, their office recommended he go to the ER.  Patient states the swelling is been going on for 2 days, he has seen some improvement over the last 24 hours.  He denies any new medications.  No other swelling or edema throughout his upper or lower extremities.  He has no chest pain or shortness of breath.  No changes in urination.  He had blood work taken 2 days ago within normal limits.     Home Medications Prior to Admission medications   Medication Sig Start Date End Date Taking? Authorizing Provider  acetaminophen (TYLENOL) 500 MG tablet Take 1,000 mg by mouth daily as needed for mild pain or moderate pain.    [provider]  acyclovir (ZOVIRAX) 400 MG tablet Take 1 tablet (400 mg total) by mouth 2 (two) times daily. 08/06/22   Creig Hines, MD  ascorbic acid (VITAMIN C) 500 MG tablet Take 1 tablet (500 mg total) by mouth daily. 04/09/22   Arnetha Courser, MD  aspirin EC 81 MG tablet Take 81 mg by mouth in the morning.    [provider]  atorvastatin (LIPITOR) 80 MG tablet Take 1 tablet (80 mg total) by mouth at bedtime. Hold while taking Paxlovid 04/08/22   Arnetha Courser, MD  Baclofen 5 MG TABS Take 5 mg by mouth daily as needed (muscle spasm).    [provider]  Continuous Glucose Sensor (DEXCOM G7 SENSOR) MISC Use 1 each every 10 (ten) days 07/27/22   [provider]   docusate sodium (COLACE) 100 MG capsule Take 100 mg by mouth daily as needed for mild constipation.    [provider]  feeding supplement, GLUCERNA SHAKE, (GLUCERNA SHAKE) LIQD Take 237 mLs by mouth 3 (three) times daily between meals. 04/29/22   Pennie Banter, DO  Glucagon (BAQSIMI ONE PACK) 3 MG/DOSE POWD Place 3 mg into the nose once as needed for up to 1 dose (for low sugar). 07/22/22   Concha Se, MD  insulin degludec (TRESIBA FLEXTOUCH) 100 UNIT/ML FlexTouch Pen Inject 8 Units into the skin daily. 08/26/22   Marrion Coy, MD  lenalidomide (REVLIMID) 10 MG capsule Take 1 capsule (10 mg total) by mouth daily. Take for 14 days, then hold for 7 days. Repeat every 21 days. 09/01/22   Creig Hines, MD  Multiple Vitamin (MULTIVITAMIN WITH MINERALS) TABS tablet Take 1 tablet by mouth daily. 04/09/22   Arnetha Courser, MD  NOVOLOG 100 UNIT/ML injection Sliding Scale: Glucose 70 - 120: 0 units  Glucose 121 - 150: 1 unit  Glucose 151 - 200: 2 units Glucose 201 - 250: 3 units  Glucose 251 - 300: 5 units  Glucose 301 - 350: 7 units  Glucose 351 - 400: 9 units  Glucose > 400: call your docotr 06/12/22   Jerald Kief, MD  ondansetron (  ZOFRAN) 8 MG tablet Take 8 mg by mouth daily as needed for nausea or vomiting.    [provider]  pantoprazole (PROTONIX) 40 MG tablet Take 1 tablet (40 mg total) by mouth 2 (two) times daily. Take twice daily for 2 months then decrease to once daily 09/20/22   Loyce Dys, MD      Allergies    Penicillins    Review of Systems   Review of Systems  Physical Exam Updated Vital Signs BP 117/63   Pulse 94   Temp 98 F (36.7 C) (Oral)   Resp 16   Ht 5\' 10"  (1.778 m)   Wt 74.4 kg   SpO2 100%   BMI 23.53 kg/m  Physical Exam Constitutional:      Appearance: He is well-developed.  HENT:     Head: Normocephalic and atraumatic.  Eyes:     Conjunctiva/sclera: Conjunctivae normal.  Cardiovascular:     Rate and Rhythm: Normal rate.   Pulmonary:     Effort: Pulmonary effort is normal. No respiratory distress.  Musculoskeletal:        General: Normal range of motion.     Cervical back: Normal range of motion.  Skin:    General: Skin is warm.     Findings: No rash.     Comments: Patient upper extremities show very minimal swelling throughout both hands.  He is able to perform full active extension and flexion of all 10 digits.  There is no edema.  There is no skin rash.  There is no warmth redness or tenderness to palpation throughout the upper extremities.  He has no other swelling or edema throughout the arms.  His lower extremities appear well with no swelling warmth erythema edema or tenderness.  He has 2+ radial pulse in the upper extremities and 2+ dorsalis pedis pulses in the lower extremities.  Sensation is intact distally in the upper and lower extremities.  Neurological:     Mental Status: He is alert and oriented to person, place, and time.  Psychiatric:        Behavior: Behavior normal.        Thought Content: Thought content normal.     ED Results / Procedures / Treatments   Labs (all labs ordered are listed, but only abnormal results are displayed) Labs Reviewed - No data to display  EKG None  Radiology No results found.  Procedures Procedures    Medications Ordered in ED Medications - No data to display  ED Course/ Medical Decision Making/ A&P                             Medical Decision Making  73 year old male presents to the ER for noticing slight swelling in both hands over the last 2 days that has been improving over the last 24 hours.  On exam there is very little swelling noticeable.  Patient has full composite fist and full active extension of the digits.  There is no signs of cellulitis.  He is neurovascular intact in the upper and lower extremities.  No other swelling throughout the lower extremities.  He had blood work taken a couple days ago within normal limits.  He admits that  his swelling that he noticed in his hands is improving.  Recommend patient continue to monitor symptoms, hopeful for continued improvement.  Patient stable and ready for discharge to home. Final Clinical Impression(s) / ED Diagnoses Final diagnoses:  Bilateral hand swelling    Rx / DC Orders ED Discharge Orders     None         Alpha, Geers 09/25/22 1936    Pilar Jarvis, MD 09/26/22 2029

## 2022-09-29 ENCOUNTER — Other Ambulatory Visit: Payer: 59

## 2022-09-29 ENCOUNTER — Ambulatory Visit: Payer: 59

## 2022-10-02 ENCOUNTER — Other Ambulatory Visit: Payer: 59

## 2022-10-02 ENCOUNTER — Other Ambulatory Visit: Payer: Self-pay

## 2022-10-02 ENCOUNTER — Inpatient Hospital Stay: Payer: 59

## 2022-10-02 ENCOUNTER — Inpatient Hospital Stay: Payer: 59 | Attending: Oncology

## 2022-10-02 ENCOUNTER — Telehealth: Payer: Self-pay

## 2022-10-02 ENCOUNTER — Inpatient Hospital Stay (HOSPITAL_BASED_OUTPATIENT_CLINIC_OR_DEPARTMENT_OTHER): Payer: 59 | Admitting: Nurse Practitioner

## 2022-10-02 ENCOUNTER — Encounter: Payer: Self-pay | Admitting: Nurse Practitioner

## 2022-10-02 VITALS — BP 103/64 | HR 99 | Temp 93.4°F | Ht 70.0 in | Wt 161.5 lb

## 2022-10-02 DIAGNOSIS — Z794 Long term (current) use of insulin: Secondary | ICD-10-CM | POA: Diagnosis not present

## 2022-10-02 DIAGNOSIS — Z79624 Long term (current) use of inhibitors of nucleotide synthesis: Secondary | ICD-10-CM | POA: Diagnosis not present

## 2022-10-02 DIAGNOSIS — D631 Anemia in chronic kidney disease: Secondary | ICD-10-CM

## 2022-10-02 DIAGNOSIS — C9000 Multiple myeloma not having achieved remission: Secondary | ICD-10-CM

## 2022-10-02 DIAGNOSIS — Z79899 Other long term (current) drug therapy: Secondary | ICD-10-CM | POA: Insufficient documentation

## 2022-10-02 DIAGNOSIS — Z5111 Encounter for antineoplastic chemotherapy: Secondary | ICD-10-CM

## 2022-10-02 DIAGNOSIS — N183 Chronic kidney disease, stage 3 unspecified: Secondary | ICD-10-CM | POA: Insufficient documentation

## 2022-10-02 DIAGNOSIS — E1065 Type 1 diabetes mellitus with hyperglycemia: Secondary | ICD-10-CM | POA: Diagnosis not present

## 2022-10-02 DIAGNOSIS — Z7961 Long term (current) use of immunomodulator: Secondary | ICD-10-CM | POA: Diagnosis not present

## 2022-10-02 DIAGNOSIS — D509 Iron deficiency anemia, unspecified: Secondary | ICD-10-CM | POA: Insufficient documentation

## 2022-10-02 DIAGNOSIS — Z5112 Encounter for antineoplastic immunotherapy: Secondary | ICD-10-CM | POA: Insufficient documentation

## 2022-10-02 LAB — COMPREHENSIVE METABOLIC PANEL
ALT: 43 U/L (ref 0–44)
AST: 28 U/L (ref 15–41)
Albumin: 3.4 g/dL — ABNORMAL LOW (ref 3.5–5.0)
Alkaline Phosphatase: 69 U/L (ref 38–126)
Anion gap: 9 (ref 5–15)
BUN: 37 mg/dL — ABNORMAL HIGH (ref 8–23)
CO2: 23 mmol/L (ref 22–32)
Calcium: 8.5 mg/dL — ABNORMAL LOW (ref 8.9–10.3)
Chloride: 102 mmol/L (ref 98–111)
Creatinine, Ser: 1.54 mg/dL — ABNORMAL HIGH (ref 0.61–1.24)
GFR, Estimated: 49 mL/min — ABNORMAL LOW (ref >60)
Glucose, Bld: 416 mg/dL — ABNORMAL HIGH (ref 70–99)
Potassium: 4.3 mmol/L (ref 3.5–5.1)
Sodium: 134 mmol/L — ABNORMAL LOW (ref 135–145)
Total Bilirubin: 0.5 mg/dL (ref 0.3–1.2)
Total Protein: 7.5 g/dL (ref 6.5–8.1)

## 2022-10-02 LAB — CBC WITH DIFFERENTIAL/PLATELET
Abs Immature Granulocytes: 0.02 10*3/uL (ref 0.00–0.07)
Basophils Absolute: 0 10*3/uL (ref 0.0–0.1)
Basophils Relative: 1 %
Eosinophils Absolute: 0.3 10*3/uL (ref 0.0–0.5)
Eosinophils Relative: 7 %
HCT: 23.9 % — ABNORMAL LOW (ref 39.0–52.0)
Hemoglobin: 7.7 g/dL — ABNORMAL LOW (ref 13.0–17.0)
Immature Granulocytes: 1 %
Lymphocytes Relative: 33 %
Lymphs Abs: 1.5 10*3/uL (ref 0.7–4.0)
MCH: 30.7 pg (ref 26.0–34.0)
MCHC: 32.2 g/dL (ref 30.0–36.0)
MCV: 95.2 fL (ref 80.0–100.0)
Monocytes Absolute: 0.4 10*3/uL (ref 0.1–1.0)
Monocytes Relative: 9 %
Neutro Abs: 2.2 10*3/uL (ref 1.7–7.7)
Neutrophils Relative %: 49 %
Platelets: 209 10*3/uL (ref 150–400)
RBC: 2.51 MIL/uL — ABNORMAL LOW (ref 4.22–5.81)
RDW: 15.1 % (ref 11.5–15.5)
WBC: 4.4 10*3/uL (ref 4.0–10.5)
nRBC: 0 % (ref 0.0–0.2)

## 2022-10-02 MED ORDER — SODIUM CHLORIDE 0.9 % IV SOLN
200.0000 mg | Freq: Once | INTRAVENOUS | Status: AC
  Administered 2022-10-02: 200 mg via INTRAVENOUS
  Filled 2022-10-02: qty 200

## 2022-10-02 MED ORDER — DIPHENHYDRAMINE HCL 25 MG PO CAPS
50.0000 mg | ORAL_CAPSULE | Freq: Once | ORAL | Status: AC
  Administered 2022-10-02: 50 mg via ORAL
  Filled 2022-10-02: qty 2

## 2022-10-02 MED ORDER — DEXAMETHASONE 4 MG PO TABS
10.0000 mg | ORAL_TABLET | Freq: Once | ORAL | Status: AC
  Administered 2022-10-02: 10 mg via ORAL
  Filled 2022-10-02: qty 3

## 2022-10-02 MED ORDER — ACETAMINOPHEN 325 MG PO TABS
650.0000 mg | ORAL_TABLET | Freq: Once | ORAL | Status: AC
  Administered 2022-10-02: 650 mg via ORAL
  Filled 2022-10-02: qty 2

## 2022-10-02 MED ORDER — DARATUMUMAB-HYALURONIDASE-FIHJ 1800-30000 MG-UT/15ML ~~LOC~~ SOLN
1800.0000 mg | Freq: Once | SUBCUTANEOUS | Status: AC
  Administered 2022-10-02: 1800 mg via SUBCUTANEOUS
  Filled 2022-10-02: qty 15

## 2022-10-02 MED ORDER — SODIUM CHLORIDE 0.9 % IV SOLN
Freq: Once | INTRAVENOUS | Status: AC
  Filled 2022-10-02: qty 250

## 2022-10-02 NOTE — Progress Notes (Unsigned)
C/o more fatigue x2 weeks.  Should he take a multivitamin?

## 2022-10-02 NOTE — Progress Notes (Signed)
Hematology/Oncology Consult Note [facility]  Telephone:([phone] [phone]  Patient Care Team: Marisue Ivan, MD as PCP - General (Family Medicine) Pa, [facility] (Optometry) Creig Hines, MD as Consulting Physician (Oncology)   Name of the patient: Nicholas Weiss  [id number]  [birth year]   Date of visit: 10/02/22  Diagnosis- high risk IgG lambda multiple myeloma R-ISS stage III     Chief complaint/ Reason for visit-on treatment assessment prior to cycle 2 day 8 of D VRD chemotherapy  Heme/Onc history: Patient is a 72 year old African-American male with a past medical history significant for uncontrolled type 1 diabetes, chronic kidney disease stage III chronic normocytic anemia referred for abnormal SPEP. Patient's creatinine has been fluctuating between 1.5-2 but about 5 months ago it went up all the way to 5 and his blood sugars were in the 1000 range. More recently his kidney numbers have been drifting back to normal values. As a part of the work-up he had serum protein electrophoresis done which showed an elevated gammaglobulin fraction with an M spike of 3%. The amount of M spike was not quantified in the specimen. He has therefore been referred to [place] for further management. Patient endorses chronic fatigue reports that his appetite and weight have remained stable   Results of blood work from 04/08/2020 were as follows: CBC showed white count of 2.9, H&H of 10.5/31.2 with an MCV of 91 and a platelet count of 191.  CMP showed a mildly elevated creatinine of 1.2 which was better as compared to 5 months ago when it was 3.9.  Total protein was mildly elevated at 9.1 calcium normal at 8.4 ferritin and iron studies B12 folate TSH and haptoglobin were normal.  Myeloma panel revealed an elevated IgG level of 06/04/2004 with an M protein of 3.1 g.  Immunofixation showed IgG lambda specificity.  Serum free light chain ratio was elevated at 25 and free  light chain lambda elevated at 370   Further myeloma work-up including a PET CT scan did not reveal any evidence of edematous lesions.  Bone marrow biopsy showed 23% plasma cells by manual count and 30% by CD138 IHC.  Normal cytogenetics.  FISH studies for myeloma showed gain of 1 q.  13 q-. detected.  P53 not detected.   He was treated for a year and has having smoldering multiple myeloma since both CKD and anemia have been stable for 3 to 4 years and could be secondary to uncontrolled diabetes.In June 2023 IgG levels increased to 5463 with an M protein of 4.1 g as compared to 2.6 g in September 2022.  Serum free light chain ratio remains around 25.  Therefore a repeat bone marrow biopsy was done which shows further increase in plasma cells ranging from 30 to 70% and by immunohistochemistry 60 to 70% of the cells were positive for CD138.  Cytogenetics normal.  FISH study showed 4: 14 translocation and gain of 1 q. making this high risk RISS stage III.  Repeat PET scan showed no lytic lesions   Patient is not a transplant candidate and was started on Velcade Revlimid dexamethasone regimen in July 2023.  Treatment complicated by repeated hospitalizations for uncontrolled blood sugars.  He has been frequently hospitalized thereby causing multiple interruptions in his treatment  Interval history- Patient returns to clinic for consideration of D8C2, daraVRd chemotherapy. He says he took his last dose of revlimid yesterday. He continues to feel tired. Appetite is reduced at times. Denies taking insulin.  Continues to use marijuana. Denies repeat syncopal episodes  ECOG PS- 2 Pain scale- 0  Review of systems- Review of Systems  Constitutional:  Positive for malaise/fatigue. Negative for chills, fever and weight loss.  HENT:  Negative for congestion, ear discharge and nosebleeds.   Eyes:  Negative for blurred vision.  Respiratory:  Negative for cough, hemoptysis, sputum production, shortness of breath and  wheezing.   Cardiovascular:  Negative for chest pain, palpitations, orthopnea and claudication.  Gastrointestinal:  Negative for abdominal pain, blood in stool, constipation, diarrhea, heartburn, melena, nausea and vomiting.  Genitourinary:  Negative for dysuria, flank pain, frequency, hematuria and urgency.  Musculoskeletal:  Negative for back pain, falls, joint pain and myalgias.  Skin:  Negative for rash.  Neurological:  Positive for dizziness. Negative for tingling, focal weakness, seizures, weakness and headaches.  Endo/Heme/Allergies:  Does not bruise/bleed easily.  Psychiatric/Behavioral:  Positive for substance abuse. Negative for depression and suicidal ideas. The patient has insomnia.       Allergies  Allergen Reactions   Penicillins Other (See Comments)    Childhood allergy -tolerated amoxil 03/2022 Unknown reaction Has patient had a PCN reaction causing immediate rash, facial/tongue/throat swelling, SOB or lightheadedness with hypotension: No Has patient had a PCN reaction causing severe rash involving mucus membranes or skin necrosis: No Has patient had a PCN reaction that required hospitalization No Has patient had a PCN reaction occurring within the last 10 years: No If all of the above answers are "NO", then may proceed with Cephalosporin use.      Past Medical History:  Diagnosis Date   Acute metabolic encephalopathy 12/02/2020   DKA (diabetic ketoacidoses) 04/06/2016   Hypercholesteremia    Hypertension      Past Surgical History:  Procedure Laterality Date   AMPUTATION TOE Left 04/25/2022   Procedure: LEFT GREAT TOE AMPUTATION AND LEFT PARTIAL 2ND TOE AMPUTATION;  Surgeon: Felecia Shelling, DPM;  Location: [facility] ORS;  Service: Podiatry;  Laterality: Left;   COLONOSCOPY WITH PROPOFOL N/A 02/01/2020   Procedure: COLONOSCOPY WITH PROPOFOL;  Surgeon: Toney Reil, MD;  Location: [facility] ENDOSCOPY;  Service: Gastroenterology;  Laterality: N/A;    ESOPHAGOGASTRODUODENOSCOPY  02/01/2020   Procedure: ESOPHAGOGASTRODUODENOSCOPY (EGD);  Surgeon: Toney Reil, MD;  Location: [facility] ENDOSCOPY;  Service: Gastroenterology;;   ESOPHAGOGASTRODUODENOSCOPY (EGD) WITH PROPOFOL N/A 06/07/2022   Procedure: ESOPHAGOGASTRODUODENOSCOPY (EGD) WITH PROPOFOL;  Surgeon: Kerin Salen, MD;  Location: [facility] ENDOSCOPY;  Service: Gastroenterology;  Laterality: N/A;   KNEE SURGERY Right    Torn meniscus   KNEE SURGERY Left     Social History   Socioeconomic History   Marital status: Single    Spouse name: Not on file   Number of children: 3   Years of education: Not on file   Highest education level: High school graduate  Occupational History    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  Tobacco Use   Smoking status: Never   Smokeless tobacco: Never  Vaping Use   Vaping Use: Never used  Substance and Sexual Activity   Alcohol use: Not Currently    Alcohol/week: 0.0 - 1.0 standard drinks of alcohol    Comment: "once every 2 months"   Drug use: Yes    Types: Marijuana, "Crack" cocaine    Comment: last week   Sexual activity: Not Currently    Birth control/protection: None  Other Topics Concern   Not on file  Social History Narrative   Lives with girlfriend "sharon"   Social Determinants of Health  Financial Resource Strain: Low Risk  (01/01/2020)   Overall Financial Resource Strain (CARDIA)    Difficulty of Paying Living Expenses: Not hard at all  Food Insecurity: No Food Insecurity (09/19/2022)   Hunger Vital Sign    Worried About Running Out of Food in the Last Year: Never true    Ran Out of Food in the Last Year: Never true  Transportation Needs: No Transportation Needs (09/19/2022)   PRAPARE - Administrator, Civil Service (Medical): No    Lack of Transportation (Non-Medical): No  Physical Activity: Insufficiently Active (01/01/2020)   Exercise Vital Sign    Days of Exercise per Week: 7 days    Minutes of Exercise per Session: 20 min   Stress: No Stress Concern Present (01/01/2020)   [employer] of Occupational Health - Occupational Stress Questionnaire    Feeling of Stress : Not at all  Social Connections: Moderately Isolated (01/01/2020)   Social Connection and Isolation Panel [NHANES]    Frequency of Communication with Friends and Family: More than three times a week    Frequency of Social Gatherings with Friends and Family: More than three times a week    Attends Religious Services: Never    Database administrator or Organizations: No    Attends Banker Meetings: Never    Marital Status: Living with partner  Intimate Partner Violence: Not At Risk (09/19/2022)   Humiliation, Afraid, Rape, and Kick questionnaire    Fear of Current or Ex-Partner: No    Emotionally Abused: No    Physically Abused: No    Sexually Abused: No    Family History  Problem Relation Age of Onset   Heart attack Father    Hypertension Sister    Cancer Sister      Current Outpatient Medications:    acetaminophen (TYLENOL) 500 MG tablet, Take 1,000 mg by mouth daily as needed for mild pain or moderate pain., Disp: , Rfl:    acyclovir (ZOVIRAX) 400 MG tablet, Take 1 tablet (400 mg total) by mouth 2 (two) times daily., Disp: 60 tablet, Rfl: 2   ascorbic acid (VITAMIN C) 500 MG tablet, Take 1 tablet (500 mg total) by mouth daily., Disp: 90 tablet, Rfl: 0   aspirin EC 81 MG tablet, Take 81 mg by mouth in the morning., Disp: , Rfl:    atorvastatin (LIPITOR) 80 MG tablet, Take 1 tablet (80 mg total) by mouth at bedtime. Hold while taking Paxlovid, Disp: 30 tablet, Rfl: 2   Baclofen 5 MG TABS, Take 5 mg by mouth daily as needed (muscle spasm)., Disp: , Rfl:    Continuous Glucose Sensor (DEXCOM G7 SENSOR) MISC, Use 1 each every 10 (ten) days, Disp: , Rfl:    docusate sodium (COLACE) 100 MG capsule, Take 100 mg by mouth daily as needed for mild constipation., Disp: , Rfl:    feeding supplement, GLUCERNA SHAKE, (GLUCERNA SHAKE)  LIQD, Take 237 mLs by mouth 3 (three) times daily between meals., Disp: , Rfl: 0   Glucagon (BAQSIMI ONE PACK) 3 MG/DOSE POWD, Place 3 mg into the nose once as needed for up to 1 dose (for low sugar)., Disp: 1 each, Rfl: 1   insulin degludec (TRESIBA FLEXTOUCH) 100 UNIT/ML FlexTouch Pen, Inject 8 Units into the skin daily., Disp: , Rfl:    lenalidomide (REVLIMID) 10 MG capsule, Take 1 capsule (10 mg total) by mouth daily. Take for 14 days, then hold for 7 days. Repeat every 21 days., Disp:  14 capsule, Rfl: 0   Multiple Vitamin (MULTIVITAMIN WITH MINERALS) TABS tablet, Take 1 tablet by mouth daily., Disp: 90 tablet, Rfl: 1   NOVOLOG 100 UNIT/ML injection, Sliding Scale: Glucose 70 - 120: 0 units  Glucose 121 - 150: 1 unit  Glucose 151 - 200: 2 units Glucose 201 - 250: 3 units  Glucose 251 - 300: 5 units  Glucose 301 - 350: 7 units  Glucose 351 - 400: 9 units  Glucose > 400: call your docotr, Disp: 10 mL, Rfl: 1   ondansetron (ZOFRAN) 8 MG tablet, Take 8 mg by mouth daily as needed for nausea or vomiting., Disp: , Rfl:    pantoprazole (PROTONIX) 40 MG tablet, Take 1 tablet (40 mg total) by mouth 2 (two) times daily. Take twice daily for 2 months then decrease to once daily, Disp: , Rfl:   Physical exam:  Vitals:   10/02/22 1055  BP: 103/64  Pulse: 99  Temp: (!) 93.4 F (34.1 C)  TempSrc: Tympanic  SpO2: 100%  Weight: 161 lb 8 oz (73.3 kg)  Height: 5\' 10"  (1.778 m)   Physical Exam Vitals reviewed.  Constitutional:      Appearance: He is not ill-appearing.  Cardiovascular:     Rate and Rhythm: Normal rate and regular rhythm.  Pulmonary:     Effort: Pulmonary effort is normal.     Breath sounds: Normal breath sounds.  Abdominal:     General: There is no distension.     Palpations: Abdomen is soft.     Tenderness: There is no abdominal tenderness.  Musculoskeletal:     Right lower leg: No edema.     Left lower leg: No edema.  Skin:    General: Skin is warm and dry.  Neurological:      Mental Status: He is alert and oriented to person, place, and time. Mental status is at baseline.  Psychiatric:        Mood and Affect: Affect is flat.        Speech: Speech is delayed and tangential.        Behavior: Behavior is cooperative.        Latest Ref Rng & Units 10/02/2022   10:43 AM  CMP  Glucose 70 - 99 mg/dL 536   BUN 8 - 23 mg/dL 37   Creatinine 6.44 - 1.24 mg/dL 0.34   Sodium 742 - 595 mmol/L 134   Potassium 3.5 - 5.1 mmol/L 4.3   Chloride 98 - 111 mmol/L 102   CO2 22 - 32 mmol/L 23   Calcium 8.9 - 10.3 mg/dL 8.5   Total Protein 6.5 - 8.1 g/dL 7.5   Total Bilirubin 0.3 - 1.2 mg/dL 0.5   Alkaline Phos 38 - 126 U/L 69   AST 15 - 41 U/L 28   ALT 0 - 44 U/L 43       Latest Ref Rng & Units 10/02/2022   10:43 AM  CBC  WBC 4.0 - 10.5 K/uL 4.4   Hemoglobin 13.0 - 17.0 g/dL 7.7   Hematocrit 63.8 - 52.0 % 23.9   Platelets 150 - 400 K/uL 209    No images are attached to the encounter.  CT Head Wo Contrast  Result Date: 09/18/2022 CLINICAL DATA:  Weakness, fell, altered level of consciousness EXAM: CT HEAD WITHOUT CONTRAST TECHNIQUE: Contiguous axial images were obtained from the base of the skull through the vertex without intravenous contrast. RADIATION DOSE REDUCTION: This exam was performed according to the  departmental dose-optimization program which includes automated exposure control, adjustment of the mA and/or kV according to patient size and/or use of iterative reconstruction technique. COMPARISON:  05/15/2022 FINDINGS: Brain: No acute infarct or hemorrhage. Lateral ventricles and midline structures are unremarkable. No acute extra-axial fluid collections. No mass effect. Diffuse coarse benign-appearing dural calcifications unchanged. Vascular: No hyperdense vessel or unexpected calcification. Skull: Normal. Negative for fracture or focal lesion. Sinuses/Orbits: No acute finding. Other: None. IMPRESSION: 1. No acute intracranial process. Electronically Signed   By:  Sharlet Salina M.D.   On: 09/18/2022 19:57   DG Chest Portable 1 View  Result Date: 09/18/2022 CLINICAL DATA:  Altered mental status EXAM: PORTABLE CHEST 1 VIEW COMPARISON:  08/18/2022 FINDINGS: The heart size and mediastinal contours are within normal limits. Aortic atherosclerosis. Streaky atelectasis left lung base. Both lungs are clear. The visualized skeletal structures are unremarkable. IMPRESSION: Streaky atelectasis at the left base. Electronically Signed   By: Jasmine Pang M.D.   On: 09/18/2022 19:53     Assessment and plan- Patient is a 72 y.o. male with   R-ISS stage III high risk IgG lambda multiple myeloma- C2D1 delayed to June 28th d/t mixxed appointment. He is here for treatment assessment prior to C2D8 of Dara-RVD chemotherapy today. Reviewed revlimid, 10 mg tabs, 14 days on, 7 days off. He started on June 24th but says he ran out of medication yesterday. I'll reach out to [facility] to have her follow up. Labs reviewed and acceptable for treatment. Proceed with C2D8 of darzalex. He will proceed directly to treatment for C2D15 then follow up with Dr Smith Robert for consideration of Cycle 3 in 2 weeks as planned.  Anemia of Chronic Kidney Disney- on retacrit 40,000 units every 3 weeks. Iron studies in ER revealed ferritin of 19. Recommend venofer 200 mg x 5. Reviewed risk of IV iron including rare anaphylaxis. He agrees to proceed. He last received retacrit on 6/11. Plan for possible retacrit next week. Will receive venofer today.  Syncopal episode- s/p hospital evaluation on 09/18/22. Thought to be due to marijuana use which is ongoing. CT was negative for acute findings. No repeat events. Monitor.  Prophylaxis- continue aspirin and acyclovir prophylaxis.  Bone disease- none noted on imaging. He has not yet started bisphosphonates. Defer to Dr Smith Robert.  Hyperglycemia- glucose elevated today. Hx of T1DM. Reviewed need for insulin administration.   Disposition:  Treatment today. Add venofer today.  1  week- lab (cbc, cmp), C2D15 darzalex, +/- retacrit 2 weeks- lab (cbc, cmp), Dr Smith Robert, C3D1 darzalex, velcade, venofer- la  Visit Diagnosis 1. Multiple myeloma not having achieved remission (HCC)   2. Encounter for antineoplastic chemotherapy   3. Iron deficiency anemia, unspecified iron deficiency anemia type   4. Anemia of chronic kidney failure, stage 3 (moderate) (HCC)     Consuello Masse, DNP, AGNP-C, AOCNP [facility] [phone] (clinic) 10/02/2022  CC: Dr Shiela Mayer, [facility]

## 2022-10-02 NOTE — Patient Instructions (Signed)
[facility] [place] [facility]  Discharge Instructions: Thank you for choosing [facility] to provide your oncology and hematology care.  If you have a lab appointment with the Cancer Center, please go directly to the Cancer Center and check in at the registration area.  Wear comfortable clothing and clothing appropriate for easy access to any Portacath or PICC line.   We strive to give you quality time with your provider. You may need to reschedule your appointment if you arrive late (15 or more minutes).  Arriving late affects you and other patients whose appointments are after yours.  Also, if you miss three or more appointments without notifying the office, you may be dismissed from the clinic at the provider's discretion.      For prescription refill requests, have your pharmacy contact our office and allow 72 hours for refills to be completed.    Today you received the following chemotherapy and/or immunotherapy agents Darzalex      To help prevent nausea and vomiting after your treatment, we encourage you to take your nausea medication as directed.  BELOW ARE SYMPTOMS THAT SHOULD BE REPORTED IMMEDIATELY: *FEVER GREATER THAN 100.4 F (38 C) OR HIGHER *CHILLS OR SWEATING *NAUSEA AND VOMITING THAT IS NOT CONTROLLED WITH YOUR NAUSEA MEDICATION *UNUSUAL SHORTNESS OF BREATH *UNUSUAL BRUISING OR BLEEDING *URINARY PROBLEMS (pain or burning when urinating, or frequent urination) *BOWEL PROBLEMS (unusual diarrhea, constipation, pain near the anus) TENDERNESS IN MOUTH AND THROAT WITH OR WITHOUT PRESENCE OF ULCERS (sore throat, sores in mouth, or a toothache) UNUSUAL RASH, SWELLING OR PAIN  UNUSUAL VAGINAL DISCHARGE OR ITCHING   Items with * indicate a potential emergency and should be followed up as soon as possible or go to the Emergency Department if any problems should occur.  Please show the CHEMOTHERAPY ALERT CARD or IMMUNOTHERAPY ALERT CARD at check-in to  the Emergency Department and triage nurse.  Should you have questions after your visit or need to cancel or reschedule your appointment, please contact [facility] [place] [facility]  [phone] and follow the prompts.  Office hours are 8:00 a.m. to 4:30 p.m. Monday - Friday. Please note that voicemails left after 4:00 p.m. may not be returned until the following business day.  We are closed weekends and major holidays. You have access to a nurse at all times for urgent questions. Please call the main number to the clinic [phone] and follow the prompts.  For any non-urgent questions, you may also contact your provider using MyChart. We now offer e-Visits for anyone 18 and older to request care online for non-urgent symptoms. For details visit [email or web address].   Also download the MyChart app! Go to the app store, search "MyChart", open the app, select [place], and log in with your MyChart username and password.      

## 2022-10-02 NOTE — Telephone Encounter (Signed)
Informed patient blood glucose 416 today in clinic. Per Lauren recommendations to adhere to compliance with insulin and monitoring. No answer, left voicemail

## 2022-10-02 NOTE — Progress Notes (Signed)
Ok to treat w/ hgb of 7.7 per MD Smith Robert.  Demetrius Charity, PharmD

## 2022-10-04 ENCOUNTER — Other Ambulatory Visit: Payer: Self-pay

## 2022-10-04 ENCOUNTER — Encounter: Payer: Self-pay | Admitting: Oncology

## 2022-10-05 LAB — KAPPA/LAMBDA LIGHT CHAINS
Kappa free light chain: 7.9 mg/L (ref 3.3–19.4)
Kappa, lambda light chain ratio: 0.06 — ABNORMAL LOW (ref 0.26–1.65)
Lambda free light chains: 124.3 mg/L — ABNORMAL HIGH (ref 5.7–26.3)

## 2022-10-06 ENCOUNTER — Encounter: Payer: Self-pay | Admitting: Oncology

## 2022-10-06 ENCOUNTER — Ambulatory Visit: Payer: 59

## 2022-10-06 ENCOUNTER — Other Ambulatory Visit: Payer: 59

## 2022-10-06 ENCOUNTER — Ambulatory Visit: Payer: 59 | Admitting: Oncology

## 2022-10-07 LAB — MULTIPLE MYELOMA PANEL, SERUM
Albumin SerPl Elph-Mcnc: 3.4 g/dL (ref 2.9–4.4)
Albumin/Glob SerPl: 0.9 (ref 0.7–1.7)
Alpha 1: 0.2 g/dL (ref 0.0–0.4)
Alpha2 Glob SerPl Elph-Mcnc: 0.6 g/dL (ref 0.4–1.0)
B-Globulin SerPl Elph-Mcnc: 0.6 g/dL — ABNORMAL LOW (ref 0.7–1.3)
Gamma Glob SerPl Elph-Mcnc: 2.5 g/dL — ABNORMAL HIGH (ref 0.4–1.8)
Globulin, Total: 3.9 g/dL (ref 2.2–3.9)
IgA: 15 mg/dL — ABNORMAL LOW (ref 61–437)
IgG (Immunoglobin G), Serum: 3024 mg/dL — ABNORMAL HIGH (ref 603–1613)
IgM (Immunoglobulin M), Srm: <5 mg/dL — ABNORMAL LOW (ref 20–172)
M Protein SerPl Elph-Mcnc: 2.2 g/dL — ABNORMAL HIGH
Total Protein ELP: 7.3 g/dL (ref 6.0–8.5)

## 2022-10-08 ENCOUNTER — Encounter: Payer: Self-pay | Admitting: Oncology

## 2022-10-09 ENCOUNTER — Encounter: Payer: Self-pay | Admitting: Oncology

## 2022-10-09 ENCOUNTER — Other Ambulatory Visit: Payer: Self-pay | Admitting: Pharmacist

## 2022-10-09 ENCOUNTER — Inpatient Hospital Stay: Payer: 59

## 2022-10-09 ENCOUNTER — Other Ambulatory Visit: Payer: Self-pay | Admitting: Oncology

## 2022-10-09 VITALS — BP 168/95 | HR 78 | Temp 97.5°F | Wt 165.5 lb

## 2022-10-09 DIAGNOSIS — N183 Chronic kidney disease, stage 3 unspecified: Secondary | ICD-10-CM

## 2022-10-09 DIAGNOSIS — C9000 Multiple myeloma not having achieved remission: Secondary | ICD-10-CM

## 2022-10-09 DIAGNOSIS — Z5112 Encounter for antineoplastic immunotherapy: Secondary | ICD-10-CM | POA: Diagnosis not present

## 2022-10-09 LAB — CBC WITH DIFFERENTIAL/PLATELET
Abs Immature Granulocytes: 0.03 10*3/uL (ref 0.00–0.07)
Basophils Absolute: 0 10*3/uL (ref 0.0–0.1)
Basophils Relative: 0 %
Eosinophils Absolute: 0.1 10*3/uL (ref 0.0–0.5)
Eosinophils Relative: 3 %
HCT: 25.5 % — ABNORMAL LOW (ref 39.0–52.0)
Hemoglobin: 8 g/dL — ABNORMAL LOW (ref 13.0–17.0)
Immature Granulocytes: 1 %
Lymphocytes Relative: 30 %
Lymphs Abs: 1.5 10*3/uL (ref 0.7–4.0)
MCH: 30.9 pg (ref 26.0–34.0)
MCHC: 31.4 g/dL (ref 30.0–36.0)
MCV: 98.5 fL (ref 80.0–100.0)
Monocytes Absolute: 0.7 10*3/uL (ref 0.1–1.0)
Monocytes Relative: 14 %
Neutro Abs: 2.5 10*3/uL (ref 1.7–7.7)
Neutrophils Relative %: 52 %
Platelets: 365 10*3/uL (ref 150–400)
RBC: 2.59 MIL/uL — ABNORMAL LOW (ref 4.22–5.81)
RDW: 15.7 % — ABNORMAL HIGH (ref 11.5–15.5)
WBC: 4.8 10*3/uL (ref 4.0–10.5)
nRBC: 0.4 % — ABNORMAL HIGH (ref 0.0–0.2)

## 2022-10-09 MED ORDER — DARATUMUMAB-HYALURONIDASE-FIHJ 1800-30000 MG-UT/15ML ~~LOC~~ SOLN
1800.0000 mg | Freq: Once | SUBCUTANEOUS | Status: AC
  Administered 2022-10-09: 1800 mg via SUBCUTANEOUS
  Filled 2022-10-09: qty 15

## 2022-10-09 MED ORDER — DEXAMETHASONE 4 MG PO TABS
10.0000 mg | ORAL_TABLET | Freq: Once | ORAL | Status: AC
  Administered 2022-10-09: 10 mg via ORAL
  Filled 2022-10-09: qty 3

## 2022-10-09 MED ORDER — EPOETIN ALFA-EPBX 40000 UNIT/ML IJ SOLN
40000.0000 [IU] | Freq: Once | INTRAMUSCULAR | Status: AC
  Administered 2022-10-09: 40000 [IU] via SUBCUTANEOUS
  Filled 2022-10-09: qty 1

## 2022-10-09 MED ORDER — DIPHENHYDRAMINE HCL 25 MG PO CAPS
50.0000 mg | ORAL_CAPSULE | Freq: Once | ORAL | Status: AC
  Administered 2022-10-09: 50 mg via ORAL

## 2022-10-09 MED ORDER — ACETAMINOPHEN 325 MG PO TABS
650.0000 mg | ORAL_TABLET | Freq: Once | ORAL | Status: AC
  Administered 2022-10-09: 650 mg via ORAL
  Filled 2022-10-09: qty 2

## 2022-10-09 MED ORDER — LENALIDOMIDE 10 MG PO CAPS
10.0000 mg | ORAL_CAPSULE | Freq: Every day | ORAL | 0 refills | Status: DC
Start: 2022-10-09 — End: 2022-11-18

## 2022-10-09 NOTE — Patient Instructions (Signed)
[facility] [place] [facility]  Discharge Instructions: Thank you for choosing [facility] to provide your oncology and hematology care.  If you have a lab appointment with the Cancer Center, please go directly to the Cancer Center and check in at the registration area.  Wear comfortable clothing and clothing appropriate for easy access to any Portacath or PICC line.   We strive to give you quality time with your provider. You may need to reschedule your appointment if you arrive late (15 or more minutes).  Arriving late affects you and other patients whose appointments are after yours.  Also, if you miss three or more appointments without notifying the office, you may be dismissed from the clinic at the provider's discretion.      For prescription refill requests, have your pharmacy contact our office and allow 72 hours for refills to be completed.    Today you received the following chemotherapy and/or immunotherapy agents darzalex    To help prevent nausea and vomiting after your treatment, we encourage you to take your nausea medication as directed.  BELOW ARE SYMPTOMS THAT SHOULD BE REPORTED IMMEDIATELY: *FEVER GREATER THAN 100.4 F (38 C) OR HIGHER *CHILLS OR SWEATING *NAUSEA AND VOMITING THAT IS NOT CONTROLLED WITH YOUR NAUSEA MEDICATION *UNUSUAL SHORTNESS OF BREATH *UNUSUAL BRUISING OR BLEEDING *URINARY PROBLEMS (pain or burning when urinating, or frequent urination) *BOWEL PROBLEMS (unusual diarrhea, constipation, pain near the anus) TENDERNESS IN MOUTH AND THROAT WITH OR WITHOUT PRESENCE OF ULCERS (sore throat, sores in mouth, or a toothache) UNUSUAL RASH, SWELLING OR PAIN  UNUSUAL VAGINAL DISCHARGE OR ITCHING   Items with * indicate a potential emergency and should be followed up as soon as possible or go to the Emergency Department if any problems should occur.  Please show the CHEMOTHERAPY ALERT CARD or IMMUNOTHERAPY ALERT CARD at check-in to  the Emergency Department and triage nurse.  Should you have questions after your visit or need to cancel or reschedule your appointment, please contact [facility] [place] [facility]  [phone] and follow the prompts.  Office hours are 8:00 a.m. to 4:30 p.m. Monday - Friday. Please note that voicemails left after 4:00 p.m. may not be returned until the following business day.  We are closed weekends and major holidays. You have access to a nurse at all times for urgent questions. Please call the main number to the clinic [phone] and follow the prompts.  For any non-urgent questions, you may also contact your provider using MyChart. We now offer e-Visits for anyone 18 and older to request care online for non-urgent symptoms. For details visit [email or web address].   Also download the MyChart app! Go to the app store, search "MyChart", open the app, select [place], and log in with your MyChart username and password.    

## 2022-10-13 ENCOUNTER — Ambulatory Visit: Payer: 59

## 2022-10-13 ENCOUNTER — Ambulatory Visit: Payer: 59 | Admitting: Oncology

## 2022-10-13 ENCOUNTER — Other Ambulatory Visit: Payer: 59

## 2022-10-16 ENCOUNTER — Inpatient Hospital Stay: Payer: 59

## 2022-10-16 ENCOUNTER — Inpatient Hospital Stay (HOSPITAL_BASED_OUTPATIENT_CLINIC_OR_DEPARTMENT_OTHER): Payer: 59 | Admitting: Oncology

## 2022-10-16 ENCOUNTER — Encounter: Payer: Self-pay | Admitting: Oncology

## 2022-10-16 VITALS — BP 142/88 | HR 85 | Temp 97.5°F | Resp 18 | Ht 70.0 in | Wt 156.5 lb

## 2022-10-16 DIAGNOSIS — Z5112 Encounter for antineoplastic immunotherapy: Secondary | ICD-10-CM | POA: Diagnosis not present

## 2022-10-16 DIAGNOSIS — C9000 Multiple myeloma not having achieved remission: Secondary | ICD-10-CM

## 2022-10-16 DIAGNOSIS — Z5111 Encounter for antineoplastic chemotherapy: Secondary | ICD-10-CM

## 2022-10-16 DIAGNOSIS — Z79899 Other long term (current) drug therapy: Secondary | ICD-10-CM

## 2022-10-16 DIAGNOSIS — D631 Anemia in chronic kidney disease: Secondary | ICD-10-CM

## 2022-10-16 LAB — CBC WITH DIFFERENTIAL/PLATELET
Abs Immature Granulocytes: 0.02 10*3/uL (ref 0.00–0.07)
Basophils Absolute: 0 10*3/uL (ref 0.0–0.1)
Basophils Relative: 1 %
Eosinophils Absolute: 0.1 10*3/uL (ref 0.0–0.5)
Eosinophils Relative: 2 %
HCT: 24.7 % — ABNORMAL LOW (ref 39.0–52.0)
Hemoglobin: 7.5 g/dL — ABNORMAL LOW (ref 13.0–17.0)
Immature Granulocytes: 0 %
Lymphocytes Relative: 45 %
Lymphs Abs: 2.3 10*3/uL (ref 0.7–4.0)
MCH: 30.5 pg (ref 26.0–34.0)
MCHC: 30.4 g/dL (ref 30.0–36.0)
MCV: 100.4 fL — ABNORMAL HIGH (ref 80.0–100.0)
Monocytes Absolute: 0.6 10*3/uL (ref 0.1–1.0)
Monocytes Relative: 12 %
Neutro Abs: 2 10*3/uL (ref 1.7–7.7)
Neutrophils Relative %: 40 %
Platelets: 355 10*3/uL (ref 150–400)
RBC: 2.46 MIL/uL — ABNORMAL LOW (ref 4.22–5.81)
RDW: 16.8 % — ABNORMAL HIGH (ref 11.5–15.5)
WBC: 5 10*3/uL (ref 4.0–10.5)
nRBC: 0.6 % — ABNORMAL HIGH (ref 0.0–0.2)

## 2022-10-16 LAB — COMPREHENSIVE METABOLIC PANEL
ALT: 29 U/L (ref 0–44)
AST: 22 U/L (ref 15–41)
Albumin: 3.7 g/dL (ref 3.5–5.0)
Alkaline Phosphatase: 59 U/L (ref 38–126)
Anion gap: 8 (ref 5–15)
BUN: 40 mg/dL — ABNORMAL HIGH (ref 8–23)
CO2: 21 mmol/L — ABNORMAL LOW (ref 22–32)
Calcium: 8.7 mg/dL — ABNORMAL LOW (ref 8.9–10.3)
Chloride: 108 mmol/L (ref 98–111)
Creatinine, Ser: 1.76 mg/dL — ABNORMAL HIGH (ref 0.61–1.24)
GFR, Estimated: 41 mL/min — ABNORMAL LOW (ref >60)
Glucose, Bld: 46 mg/dL — ABNORMAL LOW (ref 70–99)
Potassium: 4 mmol/L (ref 3.5–5.1)
Sodium: 137 mmol/L (ref 135–145)
Total Bilirubin: <0.1 mg/dL — ABNORMAL LOW (ref 0.3–1.2)
Total Protein: 8.2 g/dL — ABNORMAL HIGH (ref 6.5–8.1)

## 2022-10-16 MED ORDER — SODIUM CHLORIDE 0.9 % IV SOLN
200.0000 mg | Freq: Once | INTRAVENOUS | Status: AC
  Administered 2022-10-16: 200 mg via INTRAVENOUS
  Filled 2022-10-16: qty 200

## 2022-10-16 MED ORDER — BORTEZOMIB CHEMO SQ INJECTION 3.5 MG (2.5MG/ML)
1.3000 mg/m2 | Freq: Once | INTRAMUSCULAR | Status: AC
  Administered 2022-10-16: 2.5 mg via SUBCUTANEOUS
  Filled 2022-10-16: qty 1

## 2022-10-16 MED ORDER — DIPHENHYDRAMINE HCL 25 MG PO CAPS
50.0000 mg | ORAL_CAPSULE | Freq: Once | ORAL | Status: AC
  Administered 2022-10-16: 50 mg via ORAL

## 2022-10-16 MED ORDER — ACETAMINOPHEN 325 MG PO TABS
650.0000 mg | ORAL_TABLET | Freq: Once | ORAL | Status: AC
  Administered 2022-10-16: 650 mg via ORAL

## 2022-10-16 MED ORDER — SODIUM CHLORIDE 0.9 % IV SOLN
Freq: Once | INTRAVENOUS | Status: AC
  Filled 2022-10-16: qty 250

## 2022-10-16 MED ORDER — DEXAMETHASONE 4 MG PO TABS
10.0000 mg | ORAL_TABLET | Freq: Once | ORAL | Status: AC
  Administered 2022-10-16: 10 mg via ORAL

## 2022-10-16 MED ORDER — DARATUMUMAB-HYALURONIDASE-FIHJ 1800-30000 MG-UT/15ML ~~LOC~~ SOLN
1800.0000 mg | Freq: Once | SUBCUTANEOUS | Status: AC
  Administered 2022-10-16: 1800 mg via SUBCUTANEOUS
  Filled 2022-10-16: qty 15

## 2022-10-16 NOTE — Progress Notes (Signed)
Hgb 7.5 ok to treat

## 2022-10-16 NOTE — Patient Instructions (Signed)
[facility]  Discharge Instructions: Thank you for choosing [facility] to provide your oncology and hematology care.  If you have a lab appointment with the Cancer Center, please go directly to the Cancer Center and check in at the registration area.  Wear comfortable clothing and clothing appropriate for easy access to any Portacath or PICC line.   We strive to give you quality time with your provider. You may need to reschedule your appointment if you arrive late (15 or more minutes).  Arriving late affects you and other patients whose appointments are after yours.  Also, if you miss three or more appointments without notifying the office, you may be dismissed from the clinic at the provider's discretion.      For prescription refill requests, have your pharmacy contact our office and allow 72 hours for refills to be completed.    Today you received the following chemotherapy and/or immunotherapy agents Venofer, Velcade and Darzalex.      To help prevent nausea and vomiting after your treatment, we encourage you to take your nausea medication as directed.  BELOW ARE SYMPTOMS THAT SHOULD BE REPORTED IMMEDIATELY: *FEVER GREATER THAN 100.4 F (38 C) OR HIGHER *CHILLS OR SWEATING *NAUSEA AND VOMITING THAT IS NOT CONTROLLED WITH YOUR NAUSEA MEDICATION *UNUSUAL SHORTNESS OF BREATH *UNUSUAL BRUISING OR BLEEDING *URINARY PROBLEMS (pain or burning when urinating, or frequent urination) *BOWEL PROBLEMS (unusual diarrhea, constipation, pain near the anus) TENDERNESS IN MOUTH AND THROAT WITH OR WITHOUT PRESENCE OF ULCERS (sore throat, sores in mouth, or a toothache) UNUSUAL RASH, SWELLING OR PAIN  UNUSUAL VAGINAL DISCHARGE OR ITCHING   Items with * indicate a potential emergency and should be followed up as soon as possible or go to the Emergency Department if any problems should occur.  Please show the CHEMOTHERAPY ALERT CARD or IMMUNOTHERAPY ALERT  CARD at check-in to the Emergency Department and triage nurse.  Should you have questions after your visit or need to cancel or reschedule your appointment, please contact [facility]  [phone] and follow the prompts.  Office hours are 8:00 a.m. to 4:30 p.m. Monday - Friday. Please note that voicemails left after 4:00 p.m. may not be returned until the following business day.  We are closed weekends and major holidays. You have access to a nurse at all times for urgent questions. Please call the main number to the clinic [phone] and follow the prompts.  For any non-urgent questions, you may also contact your provider using MyChart. We now offer e-Visits for anyone 39 and older to request care online for non-urgent symptoms. For details visit [email or web address].   Also download the MyChart app! Go to the app store, search "MyChart", open the app, select [place], and log in with your MyChart username and password.

## 2022-10-17 ENCOUNTER — Emergency Department: Payer: 59

## 2022-10-17 ENCOUNTER — Emergency Department
Admission: EM | Admit: 2022-10-17 | Discharge: 2022-10-18 | Disposition: A | Discharge status: Home / Self Care | Payer: 59 | Source: Home / Self Care | Attending: Emergency Medicine | Admitting: Emergency Medicine

## 2022-10-17 ENCOUNTER — Other Ambulatory Visit: Payer: Self-pay

## 2022-10-17 ENCOUNTER — Encounter: Payer: Self-pay | Admitting: Oncology

## 2022-10-17 DIAGNOSIS — I10 Essential (primary) hypertension: Secondary | ICD-10-CM | POA: Insufficient documentation

## 2022-10-17 DIAGNOSIS — E119 Type 2 diabetes mellitus without complications: Secondary | ICD-10-CM | POA: Insufficient documentation

## 2022-10-17 DIAGNOSIS — R55 Syncope and collapse: Secondary | ICD-10-CM | POA: Diagnosis not present

## 2022-10-17 DIAGNOSIS — E1065 Type 1 diabetes mellitus with hyperglycemia: Secondary | ICD-10-CM | POA: Diagnosis not present

## 2022-10-17 DIAGNOSIS — I959 Hypotension, unspecified: Secondary | ICD-10-CM | POA: Insufficient documentation

## 2022-10-17 DIAGNOSIS — D649 Anemia, unspecified: Secondary | ICD-10-CM

## 2022-10-17 DIAGNOSIS — R531 Weakness: Secondary | ICD-10-CM | POA: Diagnosis present

## 2022-10-17 LAB — URINALYSIS, ROUTINE W REFLEX MICROSCOPIC
Bacteria, UA: NONE SEEN
Bilirubin Urine: NEGATIVE
Glucose, UA: >=500 mg/dL — AB
Hgb urine dipstick: NEGATIVE
Ketones, ur: NEGATIVE mg/dL
Leukocytes,Ua: NEGATIVE
Nitrite: NEGATIVE
Protein, ur: NEGATIVE mg/dL
Specific Gravity, Urine: 1.016 (ref 1.005–1.030)
pH: 5 (ref 5.0–8.0)

## 2022-10-17 LAB — CBC
HCT: 20.4 % — ABNORMAL LOW (ref 39.0–52.0)
Hemoglobin: 6.3 g/dL — ABNORMAL LOW (ref 13.0–17.0)
MCH: 30.7 pg (ref 26.0–34.0)
MCHC: 30.9 g/dL (ref 30.0–36.0)
MCV: 99.5 fL (ref 80.0–100.0)
Platelets: 265 10*3/uL (ref 150–400)
RBC: 2.05 MIL/uL — ABNORMAL LOW (ref 4.22–5.81)
RDW: 17.5 % — ABNORMAL HIGH (ref 11.5–15.5)
WBC: 5.2 10*3/uL (ref 4.0–10.5)
nRBC: 1.2 % — ABNORMAL HIGH (ref 0.0–0.2)

## 2022-10-17 LAB — BASIC METABOLIC PANEL
Anion gap: 6 (ref 5–15)
BUN: 47 mg/dL — ABNORMAL HIGH (ref 8–23)
CO2: 21 mmol/L — ABNORMAL LOW (ref 22–32)
Calcium: 8.9 mg/dL (ref 8.9–10.3)
Chloride: 108 mmol/L (ref 98–111)
Creatinine, Ser: 1.89 mg/dL — ABNORMAL HIGH (ref 0.61–1.24)
GFR, Estimated: 38 mL/min — ABNORMAL LOW (ref >60)
Glucose, Bld: 121 mg/dL — ABNORMAL HIGH (ref 70–99)
Potassium: 4.5 mmol/L (ref 3.5–5.1)
Sodium: 135 mmol/L (ref 135–145)

## 2022-10-17 LAB — CBG MONITORING, ED: Glucose-Capillary: 146 mg/dL — ABNORMAL HIGH (ref 70–99)

## 2022-10-17 NOTE — ED Notes (Signed)
Unable to get labs. Lab called at this time.

## 2022-10-17 NOTE — ED Provider Triage Note (Signed)
Emergency Medicine Provider Triage Evaluation Note  Nicholas Weiss , a 72 y.o. male  was evaluated in triage.  Pt complains of weakness.  Review of Systems  Positive:  Negative:   Physical Exam  There were no vitals taken for this visit. Gen:   Awake, no distress   Resp:  Normal effort  MSK:   Moves extremities without difficulty  Other:    Medical Decision Making  Medically screening exam initiated at 6:13 PM.  Appropriate orders placed.  NDREW CREASON was informed that the remainder of the evaluation will be completed by another provider, this initial triage assessment does not replace that evaluation, and the importance of remaining in the ED until their evaluation is complete.     Faythe Ghee, PA-C 10/17/22 [phone]

## 2022-10-17 NOTE — ED Notes (Addendum)
FIRST NURSE: Pt to ER via EMS from home with c/o intermittent dizziness and weakness for last several months.  EMS reports BP 80/47 on scene, pt given 500cc NS bolus and BP improved to 108/67.  CBG 71 increased to 90, as EMS arrived at ED recheck was 61 and pt given oral glucose.

## 2022-10-17 NOTE — Progress Notes (Signed)
Hematology/Oncology Consult note [facility]  Telephone:([phone] Fax:([phone]  Patient Care Team: Marisue Ivan, MD as PCP - General (Family Medicine) [facility], [facility] (Optometry) Creig Hines, MD as Consulting Physician (Oncology)   Name of the patient: Nicholas Weiss  [id number]  December 10, 1950   Date of visit: 10/17/22  Diagnosis-  high risk IgG lambda multiple myeloma R-ISS stage III     Chief complaint/ Reason for visit-on treatment assessment prior to next cycle of Darzalex and Velcade  Heme/Onc history: Patient is a 72 year old African-American male with a past medical history significant for uncontrolled type 1 diabetes, chronic kidney disease stage III chronic normocytic anemia referred for abnormal SPEP. Patient's creatinine has been fluctuating between 1.5-2 but about 5 months ago it went up all the way to 5 and his blood sugars were in the 1000 range. More recently his kidney numbers have been drifting back to normal values. As a part of the work-up he had serum protein electrophoresis done which showed an elevated gammaglobulin fraction with an M spike of 3%. The amount of M spike was not quantified in the specimen. He has therefore been referred to [place] for further management. Patient endorses chronic fatigue reports that his appetite and weight have remained stable   Results of blood work from 04/08/2020 were as follows: CBC showed white count of 2.9, H&H of 10.5/31.2 with an MCV of 91 and a platelet count of 191.  CMP showed a mildly elevated creatinine of 1.2 which was better as compared to 5 months ago when it was 3.9.  Total protein was mildly elevated at 9.1 calcium normal at 8.4 ferritin and iron studies B12 folate TSH and haptoglobin were normal.  Myeloma panel revealed an elevated IgG level of 06/04/2004 with an M protein of 3.1 g.  Immunofixation showed IgG lambda specificity.  Serum free light chain ratio was elevated at 25 and  free light chain lambda elevated at 370   Further myeloma work-up including a PET CT scan did not reveal any evidence of edematous lesions.  Bone marrow biopsy showed 23% plasma cells by manual count and 30% by CD138 IHC.  Normal cytogenetics.  FISH studies for myeloma showed gain of 1 q.  13 q-. detected.  P53 not detected.   He was treated for a year and has having smoldering multiple myeloma since both CKD and anemia have been stable for 3 to 4 years and could be secondary to uncontrolled diabetes.In June 2023 IgG levels increased to 5463 with an M protein of 4.1 g as compared to 2.6 g in September 2022.  Serum free light chain ratio remains around 25.  Therefore a repeat bone marrow biopsy was done which shows further increase in plasma cells ranging from 30 to 70% and by immunohistochemistry 60 to 70% of the cells were positive for CD138.  Cytogenetics normal.  FISH study showed 4: 14 translocation and gain of 1 q. making this high risk RISS stage III.  Repeat PET scan showed no lytic lesions   Patient is not a transplant candidate and was started on Velcade Revlimid dexamethasone regimen in July 2023.  Treatment complicated by repeated hospitalizations for uncontrolled blood sugars.  He has been frequently hospitalized thereby causing multiple interruptions in his treatment. Due to lack of adequate response to Vrd darzalex was added in may 2024    Interval history-he was admitted for another episode of syncope in June 2024.  Presently reports doing well.  He has chronic fatigue.  Denies any pain.  Denies any significant nausea or vomiting.  ECOG PS- 2 Pain scale- 0   Review of systems- Review of Systems  Constitutional:  Positive for malaise/fatigue. Negative for chills, fever and weight loss.  HENT:  Negative for congestion, ear discharge and nosebleeds.   Eyes:  Negative for blurred vision.  Respiratory:  Negative for cough, hemoptysis, sputum production, shortness of breath and wheezing.    Cardiovascular:  Negative for chest pain, palpitations, orthopnea and claudication.  Gastrointestinal:  Negative for abdominal pain, blood in stool, constipation, diarrhea, heartburn, melena, nausea and vomiting.  Genitourinary:  Negative for dysuria, flank pain, frequency, hematuria and urgency.  Musculoskeletal:  Negative for back pain, joint pain and myalgias.  Skin:  Negative for rash.  Neurological:  Negative for dizziness, tingling, focal weakness, seizures, weakness and headaches.  Endo/Heme/Allergies:  Does not bruise/bleed easily.  Psychiatric/Behavioral:  Negative for depression and suicidal ideas. The patient does not have insomnia.       Allergies  Allergen Reactions   Penicillins Other (See Comments)    Childhood allergy -tolerated amoxil 03/2022 Unknown reaction Has patient had a PCN reaction causing immediate rash, facial/tongue/throat swelling, SOB or lightheadedness with hypotension: No Has patient had a PCN reaction causing severe rash involving mucus membranes or skin necrosis: No Has patient had a PCN reaction that required hospitalization No Has patient had a PCN reaction occurring within the last 10 years: No If all of the above answers are "NO", then may proceed with Cephalosporin use.      Past Medical History:  Diagnosis Date   Acute metabolic encephalopathy 12/02/2020   DKA (diabetic ketoacidoses) 04/06/2016   Hypercholesteremia    Hypertension      Past Surgical History:  Procedure Laterality Date   AMPUTATION TOE Left 04/25/2022   Procedure: LEFT GREAT TOE AMPUTATION AND LEFT PARTIAL 2ND TOE AMPUTATION;  Surgeon: Felecia Shelling, DPM;  Location: [facility] ORS;  Service: Podiatry;  Laterality: Left;   COLONOSCOPY WITH PROPOFOL N/A 02/01/2020   Procedure: COLONOSCOPY WITH PROPOFOL;  Surgeon: Toney Reil, MD;  Location: [facility] ENDOSCOPY;  Service: Gastroenterology;  Laterality: N/A;   ESOPHAGOGASTRODUODENOSCOPY  02/01/2020   Procedure:  ESOPHAGOGASTRODUODENOSCOPY (EGD);  Surgeon: Toney Reil, MD;  Location: [facility] ENDOSCOPY;  Service: Gastroenterology;;   ESOPHAGOGASTRODUODENOSCOPY (EGD) WITH PROPOFOL N/A 06/07/2022   Procedure: ESOPHAGOGASTRODUODENOSCOPY (EGD) WITH PROPOFOL;  Surgeon: Kerin Salen, MD;  Location: [facility] ENDOSCOPY;  Service: Gastroenterology;  Laterality: N/A;   KNEE SURGERY Right    Torn meniscus   KNEE SURGERY Left     Social History   Socioeconomic History   Marital status: Single    Spouse name: Not on file   Number of children: 3   Years of education: Not on file   Highest education level: High school graduate  Occupational History    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  Tobacco Use   Smoking status: Never   Smokeless tobacco: Never  Vaping Use   Vaping status: Never Used  Substance and Sexual Activity   Alcohol use: Not Currently    Alcohol/week: 0.0 - 1.0 standard drinks of alcohol    Comment: "once every 2 months"   Drug use: Yes    Types: Marijuana, "Crack" cocaine    Comment: last week   Sexual activity: Not Currently    Birth control/protection: None  Other Topics Concern   Not on file  Social History Narrative   Lives with girlfriend "sharon"   Social  Determinants of Health   Financial Resource Strain: Low Risk  (08/05/2021)   Received from [facility]   Overall Financial Resource Strain (CARDIA)    Difficulty of Paying Living Expenses: Not hard at all  Food Insecurity: No Food Insecurity (09/19/2022)   Hunger Vital Sign    Worried About Running Out of Food in the Last Year: Never true    Ran Out of Food in the Last Year: Never true  Transportation Needs: No Transportation Needs (09/19/2022)   PRAPARE - Administrator, Civil Service (Medical): No    Lack of Transportation (Non-Medical): No  Physical Activity: Insufficiently Active (01/01/2020)   Exercise Vital Sign    Days of Exercise per Week: 7 days    Minutes of Exercise per Session: 20 min   Stress: No Stress Concern Present (01/01/2020)   [employer] of Occupational Health - Occupational Stress Questionnaire    Feeling of Stress : Not at all  Social Connections: Moderately Isolated (01/01/2020)   Social Connection and Isolation Panel [NHANES]    Frequency of Communication with Friends and Family: More than three times a week    Frequency of Social Gatherings with Friends and Family: More than three times a week    Attends Religious Services: Never    Database administrator or Organizations: No    Attends Banker Meetings: Never    Marital Status: Living with partner  Intimate Partner Violence: Not At Risk (09/19/2022)   Humiliation, Afraid, Rape, and Kick questionnaire    Fear of Current or Ex-Partner: No    Emotionally Abused: No    Physically Abused: No    Sexually Abused: No    Family History  Problem Relation Age of Onset   Heart attack Father    Hypertension Sister    Cancer Sister      Current Outpatient Medications:    acetaminophen (TYLENOL) 500 MG tablet, Take 1,000 mg by mouth daily as needed for mild pain or moderate pain., Disp: , Rfl:    acyclovir (ZOVIRAX) 400 MG tablet, Take 1 tablet (400 mg total) by mouth 2 (two) times daily., Disp: 60 tablet, Rfl: 2   ascorbic acid (VITAMIN C) 500 MG tablet, Take 1 tablet (500 mg total) by mouth daily., Disp: 90 tablet, Rfl: 0   aspirin EC 81 MG tablet, Take 81 mg by mouth in the morning., Disp: , Rfl:    atorvastatin (LIPITOR) 80 MG tablet, Take 1 tablet (80 mg total) by mouth at bedtime. Hold while taking Paxlovid, Disp: 30 tablet, Rfl: 2   Baclofen 5 MG TABS, Take 5 mg by mouth daily as needed (muscle spasm)., Disp: , Rfl:    Continuous Glucose Sensor (DEXCOM G7 SENSOR) MISC, Use 1 each every 10 (ten) days, Disp: , Rfl:    docusate sodium (COLACE) 100 MG capsule, Take 100 mg by mouth daily as needed for mild constipation., Disp: , Rfl:    feeding supplement, GLUCERNA SHAKE, (GLUCERNA SHAKE)  LIQD, Take 237 mLs by mouth 3 (three) times daily between meals., Disp: , Rfl: 0   Glucagon (BAQSIMI ONE PACK) 3 MG/DOSE POWD, Place 3 mg into the nose once as needed for up to 1 dose (for low sugar)., Disp: 1 each, Rfl: 1   insulin degludec (TRESIBA FLEXTOUCH) 100 UNIT/ML FlexTouch Pen, Inject 8 Units into the skin daily., Disp: , Rfl:    lenalidomide (REVLIMID) 10 MG capsule, Take 1 capsule (10 mg total) by mouth  daily. Take for 14 days, then hold for 7 days. Repeat every 21 days., Disp: 14 capsule, Rfl: 0   Multiple Vitamin (MULTIVITAMIN WITH MINERALS) TABS tablet, Take 1 tablet by mouth daily., Disp: 90 tablet, Rfl: 1   NOVOLOG 100 UNIT/ML injection, Sliding Scale: Glucose 70 - 120: 0 units  Glucose 121 - 150: 1 unit  Glucose 151 - 200: 2 units Glucose 201 - 250: 3 units  Glucose 251 - 300: 5 units  Glucose 301 - 350: 7 units  Glucose 351 - 400: 9 units  Glucose > 400: call your docotr, Disp: 10 mL, Rfl: 1   ondansetron (ZOFRAN) 8 MG tablet, Take 8 mg by mouth daily as needed for nausea or vomiting., Disp: , Rfl:    pantoprazole (PROTONIX) 40 MG tablet, Take 1 tablet (40 mg total) by mouth 2 (two) times daily. Take twice daily for 2 months then decrease to once daily, Disp: , Rfl:   Physical exam:  Vitals:   10/16/22 1132  BP: (!) 142/88  Pulse: 85  Resp: 18  Temp: (!) 97.5 F (36.4 C)  TempSrc: Tympanic  SpO2: 100%  Weight: 156 lb 8 oz (71 kg)  Height: 5\' 10"  (1.778 m)   Physical Exam Cardiovascular:     Rate and Rhythm: Normal rate and regular rhythm.     Heart sounds: Normal heart sounds.  Pulmonary:     Effort: Pulmonary effort is normal.     Breath sounds: Normal breath sounds.  Abdominal:     General: Bowel sounds are normal.     Palpations: Abdomen is soft.  Skin:    General: Skin is warm and dry.  Neurological:     Mental Status: He is alert and oriented to person, place, and time.         Latest Ref Rng & Units 10/16/2022   11:12 AM  CMP  Glucose 70 - 99 mg/dL  46   BUN 8 - 23 mg/dL 40   Creatinine 2.53 - 1.24 mg/dL 6.64   Sodium 403 - 474 mmol/L 137   Potassium 3.5 - 5.1 mmol/L 4.0   Chloride 98 - 111 mmol/L 108   CO2 22 - 32 mmol/L 21   Calcium 8.9 - 10.3 mg/dL 8.7   Total Protein 6.5 - 8.1 g/dL 8.2   Total Bilirubin 0.3 - 1.2 mg/dL <2.5   Alkaline Phos 38 - 126 U/L 59   AST 15 - 41 U/L 22   ALT 0 - 44 U/L 29       Latest Ref Rng & Units 10/16/2022   11:12 AM  CBC  WBC 4.0 - 10.5 K/uL 5.0   Hemoglobin 13.0 - 17.0 g/dL 7.5   Hematocrit 95.6 - 52.0 % 24.7   Platelets 150 - 400 K/uL 355     No images are attached to the encounter.  CT HEAD WO CONTRAST  Result Date: 10/17/2022 CLINICAL DATA:  Neuro deficit, acute, stroke suspected EXAM: CT HEAD WITHOUT CONTRAST TECHNIQUE: Contiguous axial images were obtained from the base of the skull through the vertex without intravenous contrast. RADIATION DOSE REDUCTION: This exam was performed according to the departmental dose-optimization program which includes automated exposure control, adjustment of the mA and/or kV according to patient size and/or use of iterative reconstruction technique. COMPARISON:  09/18/2022 FINDINGS: Brain: Extensive calcifications along the falx and tentorium, unchanged. No acute intracranial abnormality. Specifically, no hemorrhage, hydrocephalus, mass lesion, acute infarction, or significant intracranial injury. Vascular: No hyperdense vessel or unexpected calcification.  Skull: No acute calvarial abnormality. Sinuses/Orbits: No acute findings Other: None IMPRESSION: No acute intracranial abnormality. Electronically Signed   By: Charlett Nose M.D.   On: 10/17/2022 19:09   CT Head Wo Contrast  Result Date: 09/18/2022 CLINICAL DATA:  Weakness, fell, altered level of consciousness EXAM: CT HEAD WITHOUT CONTRAST TECHNIQUE: Contiguous axial images were obtained from the base of the skull through the vertex without intravenous contrast. RADIATION DOSE REDUCTION: This exam was  performed according to the departmental dose-optimization program which includes automated exposure control, adjustment of the mA and/or kV according to patient size and/or use of iterative reconstruction technique. COMPARISON:  05/15/2022 FINDINGS: Brain: No acute infarct or hemorrhage. Lateral ventricles and midline structures are unremarkable. No acute extra-axial fluid collections. No mass effect. Diffuse coarse benign-appearing dural calcifications unchanged. Vascular: No hyperdense vessel or unexpected calcification. Skull: Normal. Negative for fracture or focal lesion. Sinuses/Orbits: No acute finding. Other: None. IMPRESSION: 1. No acute intracranial process. Electronically Signed   By: Sharlet Salina M.D.   On: 09/18/2022 19:57   DG Chest Portable 1 View  Result Date: 09/18/2022 CLINICAL DATA:  Altered mental status EXAM: PORTABLE CHEST 1 VIEW COMPARISON:  08/18/2022 FINDINGS: The heart size and mediastinal contours are within normal limits. Aortic atherosclerosis. Streaky atelectasis left lung base. Both lungs are clear. The visualized skeletal structures are unremarkable. IMPRESSION: Streaky atelectasis at the left base. Electronically Signed   By: Jasmine Pang M.D.   On: 09/18/2022 19:53     Assessment and plan- Patient is a 72 y.o. male  with R-ISS stage III high risk IgG lambda multiple myeloma.  He is here for on treatment assessment prior to cycle 3-day 1 of Velcade and Darzalex  Patient will be completing 8 weekly doses of Darzalex next week.  He has had multiple delays in getting this medication because of hospitalizations and noncompliance.  Presently he is getting Velcade as well along with Darzalex once a week.    Starting 11/06/2022 I will switch him to Darzalex every 2 weeks as well as Velcade 2 weeks.  He will start his new cycle of Revlimid on 11/06/2022 and I will plan to give it to him 3 weeks on and 1 week off.  Presently he states that he is out of Revlimid for over a week.   Compliance with Revlimid is also questionable.  In terms of his myeloma labs  In terms of his myeloma labs his M protein has remained between 1.6 to 3 g over the last 7 months fluctuating up and down without a clear upward lysing trend.  Serum free lambda light chain again has been waxing and waning.  Not been able to achieve a deeper response despite starting the D VRD regimen and it is unclear if it is partially secondary to patient noncompliance.  If there is no continued response in the next 1 to 2 months I will plan to switch him to Darzalex and Pomalyst and drop Velcade and Revlimid altogether.  Normocytic anemia:Likely secondary to anemia of chronic kidney disease.  He is also receiving Retacrit every 3 to 4 weeks and will receive his next dose on 11/06/2022.  If anemia does not improve despite switching his myeloma treatment down the line I will consider getting a bone marrow biopsy at that time   Visit Diagnosis 1. Multiple myeloma not having achieved remission (HCC)   2. Encounter for antineoplastic chemotherapy   3. High risk medication use      Dr. Owens Shark,  MD, MPH [facility] [phone] 10/17/2022 8:06 PM

## 2022-10-17 NOTE — ED Provider Notes (Signed)
[facility] Provider Note    Event Date/Time   First MD Initiated Contact with Patient 10/17/22 2307     (approximate)   History   Chief Complaint: Weakness (Since 4pm)   HPI  Nicholas Weiss is a 72 y.o. male with a history of hypertension, diabetes, multiple myeloma on chemotherapy who comes ED complaining of generalized weakness, multiple falls over the last few days.  Denies any pain or injuries.  Does report bowel movements have been dark but denies melena.   09/20/22 ED discharge summary: Syncope and collapse, recurrent Hypotension-improved History of orthostatic hypotension, previously on midodrine and Florinef Acute metabolic encephalopathy AKI (acute kidney injury) (HCC) secondary to dehydration Uncontrolled type 1 diabetes mellitus with hyperglycemia, with long-term current use of insulin (HCC) Symptomatic anemia History of GI bleed/melena 05/2022, EGD with erosive gastropathy Hyponatremia-improving Multiple myeloma (HCC) Substance abuse [facility])       Physical Exam   Triage Vital Signs: ED Triage Vitals  Encounter Vitals Group     BP 10/17/22 1813 (!) 164/149     Systolic BP Percentile --      Diastolic BP Percentile --      Pulse Rate 10/17/22 1813 83     Resp 10/17/22 1813 16     Temp 10/17/22 1813 98 F (36.7 C)     Temp Source 10/17/22 1813 Oral     SpO2 10/17/22 1813 100 %     Weight 10/17/22 1814 154 lb 5.2 oz (70 kg)     Height 10/17/22 1814 5\' 10"  (1.778 m)     Head Circumference --      Peak Flow --      Pain Score 10/17/22 1814 0     Pain Loc --      Pain Education --      Exclude from Growth Chart --     Most recent vital signs: Vitals:   10/18/22 0345 10/18/22 0406  BP: (!) 169/93   Pulse: 75   Resp: 13   Temp:  98.4 F (36.9 C)  SpO2: 98%     General: Awake, no distress.  CV:  Good peripheral perfusion.  Regular rate and rhythm Resp:  Normal effort.  Clear to auscultation bilaterally Abd:  No  distention.  Soft nontender Other:  Cranial nerves III through XII intact.  No drift, normal cerebellar function.  NIH stroke scale 0. Stool is formed and hard.  Is black and Hemoccult positive but not consistent with melena   ED Results / Procedures / Treatments   Labs (all labs ordered are listed, but only abnormal results are displayed) Labs Reviewed  BASIC METABOLIC PANEL - Abnormal; Notable for the following components:      Result Value   CO2 21 (*)    Glucose, Bld 121 (*)    BUN 47 (*)    Creatinine, Ser 1.89 (*)    GFR, Estimated 38 (*)    All other components within normal limits  CBC - Abnormal; Notable for the following components:   RBC 2.05 (*)    Hemoglobin 6.3 (*)    HCT 20.4 (*)    RDW 17.5 (*)    nRBC 1.2 (*)    All other components within normal limits  URINALYSIS, ROUTINE W REFLEX MICROSCOPIC - Abnormal; Notable for the following components:   Color, Urine YELLOW (*)    APPearance CLEAR (*)    Glucose, UA >=500 (*)    All other components within normal limits  CBG MONITORING, ED - Abnormal; Notable for the following components:   Glucose-Capillary 146 (*)    All other components within normal limits  TYPE AND SCREEN  PREPARE RBC (CROSSMATCH)     EKG    RADIOLOGY CT head interpreted by me, appears normal.  Radiology report reviewed.  MRI brain pending   PROCEDURES:  Procedures   MEDICATIONS ORDERED IN ED: Medications  0.9 %  sodium chloride infusion (has no administration in time range)  acetaminophen (TYLENOL) tablet 1,000 mg (1,000 mg Oral Given 10/18/22 0215)     IMPRESSION / MDM / ASSESSMENT AND PLAN / ED COURSE  I reviewed the triage vital signs and the nursing notes.  DDx: Electrolyte abnormality, anemia, AKI, UTI, CVA, intracranial hemorrhage  Patient's presentation is most consistent with acute presentation with potential threat to life or bodily function.  Patient presents with multiple falls and generalized weakness.   Neuroexam is nonfocal.  Serum labs reveal hemoglobin of 6.3.  He has had recurrent issues chronically with anemia, requiring a Red cell infusion recently, and an iron infusion received July 19 through his hematology office.  Stool is Hemoccult positive but not melanotic.  Will give 1 unit red blood cells for symptomatic anemia, obtain MRI to ensure symptoms are not due to stroke.  ----------------------------------------- 6:39 AM on 10/18/2022 ----------------------------------------- MRI negative.  Patient ambulatory.  After transfusion, patient will be stable for discharge and continued outpatient management.       FINAL CLINICAL IMPRESSION(S) / ED DIAGNOSES   Final diagnoses:  Symptomatic anemia     Rx / DC Orders   ED Discharge Orders     None        Note:  This document was prepared using Dragon voice recognition software and may include unintentional dictation errors.   Sharman Cheek, MD 10/18/22 [phone]

## 2022-10-17 NOTE — ED Triage Notes (Addendum)
Pt to ed from home via [facility] for weakness. Pt has been falling a lot lately and his "old lady" got worried and called EMS. Pt is caox4, in no acute distress in triage. Pt is NEG for stroke screen.

## 2022-10-18 ENCOUNTER — Emergency Department: Payer: 59

## 2022-10-18 ENCOUNTER — Encounter: Payer: Self-pay | Admitting: Radiology

## 2022-10-18 LAB — PREPARE RBC (CROSSMATCH)

## 2022-10-18 MED ORDER — SODIUM CHLORIDE 0.9 % IV SOLN: 10.0000 mL/h | Freq: Once | INTRAVENOUS | Status: DC

## 2022-10-18 MED ORDER — ACETAMINOPHEN 500 MG PO TABS
1000.0000 mg | ORAL_TABLET | Freq: Once | ORAL | Status: AC
  Administered 2022-10-18: 1000 mg via ORAL
  Filled 2022-10-18: qty 2

## 2022-10-18 NOTE — ED Notes (Signed)
Pt continuing to sleep. Respirations even and unlabored. NAD noted.

## 2022-10-18 NOTE — ED Notes (Signed)
Ice water and saltines given to patient for PO challenge

## 2022-10-18 NOTE — ED Provider Notes (Signed)
Tranfusion completed, patient stable, discharge.   Pilar Jarvis, MD 10/18/22 1323

## 2022-10-19 ENCOUNTER — Other Ambulatory Visit: Payer: Self-pay

## 2022-10-19 LAB — BPAM RBC
Blood Product Expiration Date: 202408142359
ISSUE DATE / TIME: 202407211101
Unit Type and Rh: 9500

## 2022-10-19 LAB — TYPE AND SCREEN
ABO/RH(D): A POS
Antibody Screen: POSITIVE
Unit division: 0

## 2022-10-20 ENCOUNTER — Other Ambulatory Visit: Payer: 59

## 2022-10-20 ENCOUNTER — Ambulatory Visit: Payer: 59

## 2022-10-20 ENCOUNTER — Ambulatory Visit: Payer: 59 | Admitting: Oncology

## 2022-10-20 LAB — TYPE AND SCREEN: Donor AG Type: NEGATIVE

## 2022-10-20 LAB — BPAM RBC
Blood Product Expiration Date: 202407312359
Unit Type and Rh: 5100

## 2022-10-22 LAB — TYPE AND SCREEN
DAT, IgG: NEGATIVE
Unit division: 0

## 2022-10-22 LAB — BPAM RBC

## 2022-10-23 ENCOUNTER — Inpatient Hospital Stay: Payer: 59

## 2022-10-23 ENCOUNTER — Other Ambulatory Visit: Payer: Self-pay

## 2022-10-23 ENCOUNTER — Other Ambulatory Visit: Payer: 59

## 2022-10-23 ENCOUNTER — Ambulatory Visit: Payer: 59 | Admitting: Oncology

## 2022-10-23 VITALS — BP 152/84 | HR 87 | Temp 97.9°F | Wt 155.5 lb

## 2022-10-23 DIAGNOSIS — Z5112 Encounter for antineoplastic immunotherapy: Secondary | ICD-10-CM | POA: Diagnosis not present

## 2022-10-23 DIAGNOSIS — C9000 Multiple myeloma not having achieved remission: Secondary | ICD-10-CM

## 2022-10-23 LAB — CBC WITH DIFFERENTIAL/PLATELET
Abs Immature Granulocytes: 0 10*3/uL (ref 0.00–0.07)
Basophils Absolute: 0 10*3/uL (ref 0.0–0.1)
Basophils Relative: 0 %
Eosinophils Absolute: 0.1 10*3/uL (ref 0.0–0.5)
Eosinophils Relative: 3 %
HCT: 26.3 % — ABNORMAL LOW (ref 39.0–52.0)
Hemoglobin: 8.2 g/dL — ABNORMAL LOW (ref 13.0–17.0)
Immature Granulocytes: 0 %
Lymphocytes Relative: 35 %
Lymphs Abs: 1.1 10*3/uL (ref 0.7–4.0)
MCH: 30.5 pg (ref 26.0–34.0)
MCHC: 31.2 g/dL (ref 30.0–36.0)
MCV: 97.8 fL (ref 80.0–100.0)
Monocytes Absolute: 0.2 10*3/uL (ref 0.1–1.0)
Monocytes Relative: 7 %
Neutro Abs: 1.7 10*3/uL (ref 1.7–7.7)
Neutrophils Relative %: 55 %
Platelets: 302 10*3/uL (ref 150–400)
RBC: 2.69 MIL/uL — ABNORMAL LOW (ref 4.22–5.81)
RDW: 17.6 % — ABNORMAL HIGH (ref 11.5–15.5)
WBC: 3.1 10*3/uL — ABNORMAL LOW (ref 4.0–10.5)
nRBC: 0 % (ref 0.0–0.2)

## 2022-10-23 MED ORDER — DEXAMETHASONE 4 MG PO TABS
10.0000 mg | ORAL_TABLET | Freq: Once | ORAL | Status: AC
  Administered 2022-10-23: 10 mg via ORAL
  Filled 2022-10-23: qty 3

## 2022-10-23 MED ORDER — DARATUMUMAB-HYALURONIDASE-FIHJ 1800-30000 MG-UT/15ML ~~LOC~~ SOLN
1800.0000 mg | Freq: Once | SUBCUTANEOUS | Status: AC
  Administered 2022-10-23: 1800 mg via SUBCUTANEOUS
  Filled 2022-10-23: qty 15

## 2022-10-23 MED ORDER — BORTEZOMIB CHEMO SQ INJECTION 3.5 MG (2.5MG/ML)
1.3000 mg/m2 | Freq: Once | INTRAMUSCULAR | Status: AC
  Administered 2022-10-23: 2.5 mg via SUBCUTANEOUS
  Filled 2022-10-23: qty 1

## 2022-10-23 MED ORDER — DIPHENHYDRAMINE HCL 25 MG PO CAPS
50.0000 mg | ORAL_CAPSULE | Freq: Once | ORAL | Status: AC
  Administered 2022-10-23: 50 mg via ORAL
  Filled 2022-10-23: qty 2

## 2022-10-23 MED ORDER — ACETAMINOPHEN 325 MG PO TABS
650.0000 mg | ORAL_TABLET | Freq: Once | ORAL | Status: AC
  Administered 2022-10-23: 650 mg via ORAL
  Filled 2022-10-23: qty 2

## 2022-10-23 NOTE — Patient Instructions (Signed)
[place] [facility]  Discharge Instructions: Thank you for choosing [facility] to provide your oncology and hematology care.  If you have a lab appointment with the Cancer Center, please go directly to the Cancer Center and check in at the registration area.  Wear comfortable clothing and clothing appropriate for easy access to any Portacath or PICC line.   We strive to give you quality time with your provider. You may need to reschedule your appointment if you arrive late (15 or more minutes).  Arriving late affects you and other patients whose appointments are after yours.  Also, if you miss three or more appointments without notifying the office, you may be dismissed from the clinic at the provider's discretion.      For prescription refill requests, have your pharmacy contact our office and allow 72 hours for refills to be completed.    Today you received the following chemotherapy and/or immunotherapy agents darzalex and velcade    To help prevent nausea and vomiting after your treatment, we encourage you to take your nausea medication as directed.  BELOW ARE SYMPTOMS THAT SHOULD BE REPORTED IMMEDIATELY: *FEVER GREATER THAN 100.4 F (38 C) OR HIGHER *CHILLS OR SWEATING *NAUSEA AND VOMITING THAT IS NOT CONTROLLED WITH YOUR NAUSEA MEDICATION *UNUSUAL SHORTNESS OF BREATH *UNUSUAL BRUISING OR BLEEDING *URINARY PROBLEMS (pain or burning when urinating, or frequent urination) *BOWEL PROBLEMS (unusual diarrhea, constipation, pain near the anus) TENDERNESS IN MOUTH AND THROAT WITH OR WITHOUT PRESENCE OF ULCERS (sore throat, sores in mouth, or a toothache) UNUSUAL RASH, SWELLING OR PAIN  UNUSUAL VAGINAL DISCHARGE OR ITCHING   Items with * indicate a potential emergency and should be followed up as soon as possible or go to the Emergency Department if any problems should occur.  Please show the CHEMOTHERAPY ALERT CARD or IMMUNOTHERAPY ALERT CARD at  check-in to the Emergency Department and triage nurse.  Should you have questions after your visit or need to cancel or reschedule your appointment, please contact [place] [facility]  [phone] and follow the prompts.  Office hours are 8:00 a.m. to 4:30 p.m. Monday - Friday. Please note that voicemails left after 4:00 p.m. may not be returned until the following business day.  We are closed weekends and major holidays. You have access to a nurse at all times for urgent questions. Please call the main number to the clinic [phone] and follow the prompts.  For any non-urgent questions, you may also contact your provider using MyChart. We now offer e-Visits for anyone 88 and older to request care online for non-urgent symptoms. For details visit [email or web address].   Also download the MyChart app! Go to the app store, search "MyChart", open the app, select [place], and log in with your MyChart username and password.

## 2022-10-27 ENCOUNTER — Ambulatory Visit: Payer: 59 | Admitting: Oncology

## 2022-10-27 ENCOUNTER — Ambulatory Visit: Payer: 59

## 2022-10-27 ENCOUNTER — Other Ambulatory Visit: Payer: 59

## 2022-10-28 DIAGNOSIS — Z5112 Encounter for antineoplastic immunotherapy: Secondary | ICD-10-CM | POA: Diagnosis not present

## 2022-10-30 ENCOUNTER — Ambulatory Visit: Payer: 59

## 2022-10-30 ENCOUNTER — Other Ambulatory Visit: Payer: 59

## 2022-10-30 ENCOUNTER — Ambulatory Visit: Payer: 59 | Admitting: Nurse Practitioner

## 2022-10-31 ENCOUNTER — Emergency Department
Admission: EM | Admit: 2022-10-31 | Discharge: 2022-11-01 | Disposition: A | Discharge status: Home / Self Care | Payer: 59 | Attending: Emergency Medicine | Admitting: Emergency Medicine

## 2022-10-31 ENCOUNTER — Other Ambulatory Visit: Payer: Self-pay

## 2022-10-31 ENCOUNTER — Encounter: Payer: Self-pay | Admitting: Emergency Medicine

## 2022-10-31 ENCOUNTER — Emergency Department: Payer: 59

## 2022-10-31 DIAGNOSIS — I6782 Cerebral ischemia: Secondary | ICD-10-CM | POA: Insufficient documentation

## 2022-10-31 DIAGNOSIS — E162 Hypoglycemia, unspecified: Secondary | ICD-10-CM

## 2022-10-31 DIAGNOSIS — F1290 Cannabis use, unspecified, uncomplicated: Secondary | ICD-10-CM | POA: Insufficient documentation

## 2022-10-31 DIAGNOSIS — E11649 Type 2 diabetes mellitus with hypoglycemia without coma: Secondary | ICD-10-CM | POA: Diagnosis not present

## 2022-10-31 LAB — COMPREHENSIVE METABOLIC PANEL
ALT: 25 U/L (ref 0–44)
AST: 18 U/L (ref 15–41)
Albumin: 3.3 g/dL — ABNORMAL LOW (ref 3.5–5.0)
Alkaline Phosphatase: 46 U/L (ref 38–126)
Anion gap: 6 (ref 5–15)
BUN: 39 mg/dL — ABNORMAL HIGH (ref 8–23)
CO2: 23 mmol/L (ref 22–32)
Calcium: 8.3 mg/dL — ABNORMAL LOW (ref 8.9–10.3)
Chloride: 107 mmol/L (ref 98–111)
Creatinine, Ser: 1.94 mg/dL — ABNORMAL HIGH (ref 0.61–1.24)
GFR, Estimated: 37 mL/min — ABNORMAL LOW (ref >60)
Glucose, Bld: 135 mg/dL — ABNORMAL HIGH (ref 70–99)
Potassium: 3.9 mmol/L (ref 3.5–5.1)
Sodium: 136 mmol/L (ref 135–145)
Total Bilirubin: 0.5 mg/dL (ref 0.3–1.2)
Total Protein: 7.3 g/dL (ref 6.5–8.1)

## 2022-10-31 LAB — CBC WITH DIFFERENTIAL/PLATELET
Abs Immature Granulocytes: 0.02 10*3/uL (ref 0.00–0.07)
Basophils Absolute: 0 10*3/uL (ref 0.0–0.1)
Basophils Relative: 0 %
Eosinophils Absolute: 0.2 10*3/uL (ref 0.0–0.5)
Eosinophils Relative: 3 %
HCT: 25.1 % — ABNORMAL LOW (ref 39.0–52.0)
Hemoglobin: 7.9 g/dL — ABNORMAL LOW (ref 13.0–17.0)
Immature Granulocytes: 1 %
Lymphocytes Relative: 19 %
Lymphs Abs: 0.8 10*3/uL (ref 0.7–4.0)
MCH: 31 pg (ref 26.0–34.0)
MCHC: 31.5 g/dL (ref 30.0–36.0)
MCV: 98.4 fL (ref 80.0–100.0)
Monocytes Absolute: 0.4 10*3/uL (ref 0.1–1.0)
Monocytes Relative: 9 %
Neutro Abs: 3 10*3/uL (ref 1.7–7.7)
Neutrophils Relative %: 68 %
Platelets: 251 10*3/uL (ref 150–400)
RBC: 2.55 MIL/uL — ABNORMAL LOW (ref 4.22–5.81)
RDW: 17.1 % — ABNORMAL HIGH (ref 11.5–15.5)
WBC: 4.4 10*3/uL (ref 4.0–10.5)
nRBC: 0 % (ref 0.0–0.2)

## 2022-10-31 LAB — CBG MONITORING, ED
Glucose-Capillary: 142 mg/dL — ABNORMAL HIGH (ref 70–99)
Glucose-Capillary: 31 mg/dL — CL (ref 70–99)
Glucose-Capillary: 111 mg/dL — ABNORMAL HIGH (ref 70–99)
Glucose-Capillary: 108 mg/dL — ABNORMAL HIGH (ref 70–99)
Glucose-Capillary: 82 mg/dL (ref 70–99)
Glucose-Capillary: 187 mg/dL — ABNORMAL HIGH (ref 70–99)

## 2022-10-31 MED ORDER — SODIUM CHLORIDE 0.9 % IV BOLUS
1000.0000 mL | Freq: Once | INTRAVENOUS | Status: AC
  Administered 2022-10-31: 1000 mL via INTRAVENOUS

## 2022-10-31 MED ORDER — DEXTROSE 50 % IV SOLN
1.0000 | Freq: Once | INTRAVENOUS | Status: AC
  Administered 2022-10-31: 50 mL via INTRAVENOUS

## 2022-10-31 MED ORDER — DEXTROSE 50 % IV SOLN
INTRAVENOUS | Status: AC
  Filled 2022-10-31: qty 50

## 2022-10-31 NOTE — ED Triage Notes (Signed)
Pt arrives via EMS where pt was with a friend smoking weed today, then went home and parked at home with heat full blast, CBG 37 and covered in urine.  D10 given and CBG 129. Pt then refused transit and EMS fed him pb&J. Pt then orthostatic and BP in 80s so EMS brought pt in. Pt awake to voice but very lethargic.

## 2022-10-31 NOTE — ED Provider Notes (Signed)
[facility] Provider Note   Event Date/Time   First MD Initiated Contact with Patient 10/31/22 1545     (approximate) History  Hypotension and Hypoglycemia  HPI Nicholas Weiss is a 72 y.o. male with a past medical history of type 2 diabetes and marijuana use who presents complaining of altered mental status, nausea, and found in his garage with the car running for short period of time.  EMS found patient's sugar to be in the 30s.  D10 drip was started in the route with improvement of patient's glucose to 129.  Patient then refused transit and EMS fed him a sandwich.  Patient then became orthostatic with blood pressures in the 80s and was brought to the emergency department.  Patient awakens to voice but is very lethargic and states that he has taken all of his medications on time and as prescribed today.  Patient states that he smoked marijuana prior to the symptoms and is concerned that there may have been something in the marijuana other than cannabis. ROS: Patient currently denies any vision changes, tinnitus, difficulty speaking, facial droop, sore throat, chest pain, shortness of breath, abdominal pain, vomiting/diarrhea, dysuria, or numbness/paresthesias in any extremity   Physical Exam  Triage Vital Signs: ED Triage Vitals  Encounter Vitals Group     BP      Systolic BP Percentile      Diastolic BP Percentile      Pulse      Resp      Temp      Temp src      SpO2      Weight      Height      Head Circumference      Peak Flow      Pain Score      Pain Loc      Pain Education      Exclude from Growth Chart    Most recent vital signs: Vitals:   10/31/22 1830 10/31/22 1845  BP: (!) 145/78 (!) 181/94  Pulse: 71 73  Resp: 12 12  Temp:    SpO2: 100% 100%   General: Somnolent but easily awoken to voice, oriented x4. CV:  Good peripheral perfusion.  Resp:  Normal effort.  Abd:  No distention.  Other:  Elderly well-developed, well-nourished  African-American male laying in bed in no acute distress ED Results / Procedures / Treatments  Labs (all labs ordered are listed, but only abnormal results are displayed) Labs Reviewed  COMPREHENSIVE METABOLIC PANEL - Abnormal; Notable for the following components:      Result Value   Glucose, Bld 135 (*)    BUN 39 (*)    Creatinine, Ser 1.94 (*)    Calcium 8.3 (*)    Albumin 3.3 (*)    GFR, Estimated 37 (*)    All other components within normal limits  CBC WITH DIFFERENTIAL/PLATELET - Abnormal; Notable for the following components:   RBC 2.55 (*)    Hemoglobin 7.9 (*)    HCT 25.1 (*)    RDW 17.1 (*)    All other components within normal limits  CBG MONITORING, ED - Abnormal; Notable for the following components:   Glucose-Capillary 31 (*)    All other components within normal limits  CBG MONITORING, ED - Abnormal; Notable for the following components:   Glucose-Capillary 142 (*)    All other components within normal limits  CBG MONITORING, ED - Abnormal; Notable for the following components:  Glucose-Capillary 111 (*)    All other components within normal limits  CBG MONITORING, ED - Abnormal; Notable for the following components:   Glucose-Capillary 108 (*)    All other components within normal limits  CBG MONITORING, ED - Abnormal; Notable for the following components:   Glucose-Capillary 187 (*)    All other components within normal limits  URINALYSIS, W/ REFLEX TO CULTURE (INFECTION SUSPECTED)  URINE DRUG SCREEN, QUALITATIVE ([facility] ONLY)  CBG MONITORING, ED  CBG MONITORING, ED  CBG MONITORING, ED  CBG MONITORING, ED  CBG MONITORING, ED  CBG MONITORING, ED  CBG MONITORING, ED  CBG MONITORING, ED  CBG MONITORING, ED  CBG MONITORING, ED  CBG MONITORING, ED  CBG MONITORING, ED  CBG MONITORING, ED  CBG MONITORING, ED  CBG MONITORING, ED   EKG ED ECG REPORT I, Merwyn Katos, the attending physician, personally viewed and interpreted this ECG. Date:  10/31/2022 EKG Time: 2301 Rate: 59 Rhythm: normal sinus rhythm QRS Axis: normal Intervals: normal ST/T Wave abnormalities: normal Narrative Interpretation: no evidence of acute ischemia RADIOLOGY ED MD interpretation: CT of the head without contrast interpreted by me shows no evidence of acute abnormalities including no intracerebral hemorrhage, obvious masses, or significant edema -Agree with radiology assessment Official radiology report(s): CT Head Wo Contrast  Result Date: 10/31/2022 CLINICAL DATA:  Mental status change, unknown cause EXAM: CT HEAD WITHOUT CONTRAST TECHNIQUE: Contiguous axial images were obtained from the base of the skull through the vertex without intravenous contrast. RADIATION DOSE REDUCTION: This exam was performed according to the departmental dose-optimization program which includes automated exposure control, adjustment of the mA and/or kV according to patient size and/or use of iterative reconstruction technique. COMPARISON:  Head CT 10/17/2022, brain MRI 10/18/2022 FINDINGS: Brain: No intracranial hemorrhage, mass effect, or midline shift. No hydrocephalus. The basilar cisterns are patent. Stable chronic small vessel ischemia. No evidence of territorial infarct or acute ischemia. Chronic dural calcifications. No extra-axial or intracranial fluid collection. Vascular: Atherosclerosis of skullbase vasculature without hyperdense vessel or abnormal calcification. Skull: No fracture or focal lesion. Sinuses/Orbits: Paranasal sinuses and mastoid air cells are clear. The visualized orbits are unremarkable. Other: None. IMPRESSION: 1. No acute intracranial abnormality. 2. Stable chronic small vessel ischemia. Electronically Signed   By: Narda Rutherford M.D.   On: 10/31/2022 16:25   PROCEDURES: Critical Care performed: No .1-3 Lead EKG Interpretation  Performed by: Merwyn Katos, MD Authorized by: Merwyn Katos, MD     Interpretation: normal     ECG rate:  71   ECG  rate assessment: normal     Rhythm: sinus rhythm     Ectopy: none     Conduction: normal    MEDICATIONS ORDERED IN ED: Medications  sodium chloride 0.9 % bolus 1,000 mL (0 mLs Intravenous Stopped 10/31/22 1853)  dextrose 50 % solution 50 mL (50 mLs Intravenous Not Given 10/31/22 1656)   IMPRESSION / MDM / ASSESSMENT AND PLAN / ED COURSE  I reviewed the triage vital signs and the nursing notes.                             The patient is on the cardiac monitor to evaluate for evidence of arrhythmia and/or significant heart rate changes. Patient's presentation is most consistent with acute presentation with potential threat to life or bodily function. Type II diabetic presents BIBA for hypoglycemia and altered mental status  Given History, Exam, and Workup  I have a low suspicion for sepsis induced hypoglycemia, long lasting medication overdose, suicidal ideation/purposeful overdose attempt.  Reassessment: Patient was given p.o. with improvement in his blood sugar continuously and was discharged after uptrending glucose After observation in the emergency department for several hours, the Blood sugar has remained stable, and based on the history is expected that the Blood sugar should remain stable after discharge. Disposition: Plan discharge home with close primary care follow-up   FINAL CLINICAL IMPRESSION(S) / ED DIAGNOSES   Final diagnoses:  Hypoglycemia   Rx / DC Orders   ED Discharge Orders     None      Note:  This document was prepared using Dragon voice recognition software and may include unintentional dictation errors.   Merwyn Katos, MD 10/31/22 [phone]

## 2022-10-31 NOTE — ED Notes (Signed)
Pt was given 1 carton of chocolate milk and lunch box was ordered.

## 2022-11-03 NOTE — Progress Notes (Signed)
This encounter was created in error - please disregard.

## 2022-11-06 ENCOUNTER — Inpatient Hospital Stay (HOSPITAL_BASED_OUTPATIENT_CLINIC_OR_DEPARTMENT_OTHER): Payer: 59 | Admitting: Oncology

## 2022-11-06 ENCOUNTER — Encounter: Payer: Self-pay | Admitting: Oncology

## 2022-11-06 ENCOUNTER — Inpatient Hospital Stay: Payer: 59 | Attending: Oncology

## 2022-11-06 ENCOUNTER — Inpatient Hospital Stay: Payer: 59

## 2022-11-06 VITALS — BP 138/87 | HR 81 | Temp 97.1°F | Resp 18 | Ht 70.0 in | Wt 153.7 lb

## 2022-11-06 DIAGNOSIS — Z79899 Other long term (current) drug therapy: Secondary | ICD-10-CM | POA: Insufficient documentation

## 2022-11-06 DIAGNOSIS — D649 Anemia, unspecified: Secondary | ICD-10-CM | POA: Diagnosis not present

## 2022-11-06 DIAGNOSIS — Z5111 Encounter for antineoplastic chemotherapy: Secondary | ICD-10-CM

## 2022-11-06 DIAGNOSIS — N183 Chronic kidney disease, stage 3 unspecified: Secondary | ICD-10-CM | POA: Diagnosis present

## 2022-11-06 DIAGNOSIS — D631 Anemia in chronic kidney disease: Secondary | ICD-10-CM | POA: Insufficient documentation

## 2022-11-06 DIAGNOSIS — Z7961 Long term (current) use of immunomodulator: Secondary | ICD-10-CM | POA: Insufficient documentation

## 2022-11-06 DIAGNOSIS — C9000 Multiple myeloma not having achieved remission: Secondary | ICD-10-CM

## 2022-11-06 DIAGNOSIS — Z79624 Long term (current) use of inhibitors of nucleotide synthesis: Secondary | ICD-10-CM | POA: Diagnosis not present

## 2022-11-06 DIAGNOSIS — Z5112 Encounter for antineoplastic immunotherapy: Secondary | ICD-10-CM | POA: Insufficient documentation

## 2022-11-06 LAB — CBC WITH DIFFERENTIAL/PLATELET
Abs Immature Granulocytes: 0.01 10*3/uL (ref 0.00–0.07)
Basophils Absolute: 0 10*3/uL (ref 0.0–0.1)
Basophils Relative: 0 %
Eosinophils Absolute: 0 10*3/uL (ref 0.0–0.5)
Eosinophils Relative: 1 %
HCT: 28.4 % — ABNORMAL LOW (ref 39.0–52.0)
Hemoglobin: 8.9 g/dL — ABNORMAL LOW (ref 13.0–17.0)
Immature Granulocytes: 0 %
Lymphocytes Relative: 24 %
Lymphs Abs: 1.1 10*3/uL (ref 0.7–4.0)
MCH: 30.8 pg (ref 26.0–34.0)
MCHC: 31.3 g/dL (ref 30.0–36.0)
MCV: 98.3 fL (ref 80.0–100.0)
Monocytes Absolute: 0.4 10*3/uL (ref 0.1–1.0)
Monocytes Relative: 10 %
Neutro Abs: 2.8 10*3/uL (ref 1.7–7.7)
Neutrophils Relative %: 65 %
Platelets: 246 10*3/uL (ref 150–400)
RBC: 2.89 MIL/uL — ABNORMAL LOW (ref 4.22–5.81)
RDW: 16.5 % — ABNORMAL HIGH (ref 11.5–15.5)
WBC: 4.3 10*3/uL (ref 4.0–10.5)
nRBC: 0 % (ref 0.0–0.2)

## 2022-11-06 LAB — COMPREHENSIVE METABOLIC PANEL
ALT: 29 U/L (ref 0–44)
AST: 19 U/L (ref 15–41)
Albumin: 3.5 g/dL (ref 3.5–5.0)
Alkaline Phosphatase: 54 U/L (ref 38–126)
Anion gap: 7 (ref 5–15)
BUN: 38 mg/dL — ABNORMAL HIGH (ref 8–23)
CO2: 24 mmol/L (ref 22–32)
Calcium: 8.7 mg/dL — ABNORMAL LOW (ref 8.9–10.3)
Chloride: 102 mmol/L (ref 98–111)
Creatinine, Ser: 1.86 mg/dL — ABNORMAL HIGH (ref 0.61–1.24)
GFR, Estimated: 39 mL/min — ABNORMAL LOW (ref >60)
Glucose, Bld: 292 mg/dL — ABNORMAL HIGH (ref 70–99)
Potassium: 4.6 mmol/L (ref 3.5–5.1)
Sodium: 133 mmol/L — ABNORMAL LOW (ref 135–145)
Total Bilirubin: 0.6 mg/dL (ref 0.3–1.2)
Total Protein: 7.9 g/dL (ref 6.5–8.1)

## 2022-11-06 MED ORDER — ACETAMINOPHEN 325 MG PO TABS
650.0000 mg | ORAL_TABLET | Freq: Once | ORAL | Status: AC
  Administered 2022-11-06: 650 mg via ORAL
  Filled 2022-11-06: qty 2

## 2022-11-06 MED ORDER — BORTEZOMIB CHEMO SQ INJECTION 3.5 MG (2.5MG/ML)
1.3000 mg/m2 | Freq: Once | INTRAMUSCULAR | Status: AC
  Administered 2022-11-06: 2.5 mg via SUBCUTANEOUS
  Filled 2022-11-06: qty 1

## 2022-11-06 MED ORDER — DIPHENHYDRAMINE HCL 25 MG PO CAPS
50.0000 mg | ORAL_CAPSULE | Freq: Once | ORAL | Status: AC
  Administered 2022-11-06: 50 mg via ORAL
  Filled 2022-11-06: qty 2

## 2022-11-06 MED ORDER — EPOETIN ALFA-EPBX 40000 UNIT/ML IJ SOLN
40000.0000 [IU] | INTRAMUSCULAR | Status: DC
  Administered 2022-11-06: 40000 [IU] via SUBCUTANEOUS
  Filled 2022-11-06: qty 1

## 2022-11-06 MED ORDER — DARATUMUMAB-HYALURONIDASE-FIHJ 1800-30000 MG-UT/15ML ~~LOC~~ SOLN
1800.0000 mg | Freq: Once | SUBCUTANEOUS | Status: AC
  Administered 2022-11-06: 1800 mg via SUBCUTANEOUS
  Filled 2022-11-06: qty 15

## 2022-11-06 MED ORDER — DEXAMETHASONE 4 MG PO TABS
10.0000 mg | ORAL_TABLET | Freq: Once | ORAL | Status: AC
  Administered 2022-11-06: 10 mg via ORAL
  Filled 2022-11-06: qty 3

## 2022-11-08 ENCOUNTER — Encounter: Payer: Self-pay | Admitting: Oncology

## 2022-11-08 NOTE — Progress Notes (Addendum)
Hematology/Oncology Consult note [facility]  Telephone:([phone] [phone]  Patient Care Team: Marisue Ivan, MD as PCP - General (Family Medicine) [place], [facility] (Optometry) Creig Hines, MD as Consulting Physician (Oncology)   Name of the patient: Nicholas Weiss  [id number]  1950-04-19   Date of visit: 11/08/22  Diagnosis- high risk IgG lambda multiple myeloma R-ISS stage III     Chief complaint/ Reason for visit- on treatment assessment prior to next cycle of darzalex and velcade  Heme/Onc history: Patient is a 72 year old African-American male with a past medical history significant for uncontrolled type 1 diabetes, chronic kidney disease stage III chronic normocytic anemia referred for abnormal SPEP. Patient's creatinine has been fluctuating between 1.5-2 but about 5 months ago it went up all the way to 5 and his blood sugars were in the 1000 range. More recently his kidney numbers have been drifting back to normal values. As a part of the work-up he had serum protein electrophoresis done which showed an elevated gammaglobulin fraction with an M spike of 3%. The amount of M spike was not quantified in the specimen. He has therefore been referred to [place] for further management. Patient endorses chronic fatigue reports that his appetite and weight have remained stable   Results of blood work from 04/08/2020 were as follows: CBC showed white count of 2.9, H&H of 10.5/31.2 with an MCV of 91 and a platelet count of 191.  CMP showed a mildly elevated creatinine of 1.2 which was better as compared to 5 months ago when it was 3.9.  Total protein was mildly elevated at 9.1 calcium normal at 8.4 ferritin and iron studies B12 folate TSH and haptoglobin were normal.  Myeloma panel revealed an elevated IgG level of 06/04/2004 with an M protein of 3.1 g.  Immunofixation showed IgG lambda specificity.  Serum free light chain ratio was elevated at 25 and  free light chain lambda elevated at 370   Further myeloma work-up including a PET CT scan did not reveal any evidence of edematous lesions.  Bone marrow biopsy showed 23% plasma cells by manual count and 30% by CD138 IHC.  Normal cytogenetics.  FISH studies for myeloma showed gain of 1 q.  13 q-. detected.  P53 not detected.   He was treated for a year and has having smoldering multiple myeloma since both CKD and anemia have been stable for 3 to 4 years and could be secondary to uncontrolled diabetes.In June 2023 IgG levels increased to 5463 with an M protein of 4.1 g as compared to 2.6 g in September 2022.  Serum free light chain ratio remains around 25.  Therefore a repeat bone marrow biopsy was done which shows further increase in plasma cells ranging from 30 to 70% and by immunohistochemistry 60 to 70% of the cells were positive for CD138.  Cytogenetics normal.  FISH study showed 4: 14 translocation and gain of 1 q. making this high risk RISS stage III.  Repeat PET scan showed no lytic lesions   Patient is not a transplant candidate and was started on Velcade Revlimid dexamethasone regimen in July 2023.  He was also referred to [facility] for second opinion but did not go.  Treatment complicated by repeated hospitalizations for uncontrolled blood sugars.  He has been frequently hospitalized thereby causing multiple interruptions in his treatment. Due to lack of adequate response to Vrd darzalex was added in may 2024  Interval history- Patient continues to have  hospital visits at least once a month for his hypoglycemia as well as hypotension.  He denies any pain at this time.  Overall tolerating Revlimid Velcade and Darzalex well.  ECOG PS- 2 Pain scale- 0   Review of systems- Review of Systems  Constitutional:  Positive for malaise/fatigue. Negative for chills, fever and weight loss.  HENT:  Negative for congestion, ear discharge and nosebleeds.   Eyes:  Negative for blurred vision.  Respiratory:   Negative for cough, hemoptysis, sputum production, shortness of breath and wheezing.   Cardiovascular:  Negative for chest pain, palpitations, orthopnea and claudication.  Gastrointestinal:  Negative for abdominal pain, blood in stool, constipation, diarrhea, heartburn, melena, nausea and vomiting.  Genitourinary:  Negative for dysuria, flank pain, frequency, hematuria and urgency.  Musculoskeletal:  Negative for back pain, joint pain and myalgias.  Skin:  Negative for rash.  Neurological:  Negative for dizziness, tingling, focal weakness, seizures, weakness and headaches.  Endo/Heme/Allergies:  Does not bruise/bleed easily.  Psychiatric/Behavioral:  Negative for depression and suicidal ideas. The patient does not have insomnia.       Allergies  Allergen Reactions   Penicillins Other (See Comments)    Childhood allergy -tolerated amoxil 03/2022 Unknown reaction Has patient had a PCN reaction causing immediate rash, facial/tongue/throat swelling, SOB or lightheadedness with hypotension: No Has patient had a PCN reaction causing severe rash involving mucus membranes or skin necrosis: No Has patient had a PCN reaction that required hospitalization No Has patient had a PCN reaction occurring within the last 10 years: No If all of the above answers are "NO", then may proceed with Cephalosporin use.      Past Medical History:  Diagnosis Date   Acute metabolic encephalopathy 12/02/2020   DKA (diabetic ketoacidoses) 04/06/2016   Hypercholesteremia    Hypertension      Past Surgical History:  Procedure Laterality Date   AMPUTATION TOE Left 04/25/2022   Procedure: LEFT GREAT TOE AMPUTATION AND LEFT PARTIAL 2ND TOE AMPUTATION;  Surgeon: Felecia Shelling, DPM;  Location: [facility] ORS;  Service: Podiatry;  Laterality: Left;   COLONOSCOPY WITH PROPOFOL N/A 02/01/2020   Procedure: COLONOSCOPY WITH PROPOFOL;  Surgeon: Toney Reil, MD;  Location: [facility] ENDOSCOPY;  Service: Gastroenterology;   Laterality: N/A;   ESOPHAGOGASTRODUODENOSCOPY  02/01/2020   Procedure: ESOPHAGOGASTRODUODENOSCOPY (EGD);  Surgeon: Toney Reil, MD;  Location: [facility] ENDOSCOPY;  Service: Gastroenterology;;   ESOPHAGOGASTRODUODENOSCOPY (EGD) WITH PROPOFOL N/A 06/07/2022   Procedure: ESOPHAGOGASTRODUODENOSCOPY (EGD) WITH PROPOFOL;  Surgeon: Kerin Salen, MD;  Location: [facility] ENDOSCOPY;  Service: Gastroenterology;  Laterality: N/A;   KNEE SURGERY Right    Torn meniscus   KNEE SURGERY Left     Social History   Socioeconomic History   Marital status: Single    Spouse name: Not on file   Number of children: 3   Years of education: Not on file   Highest education level: High school graduate  Occupational History    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  Tobacco Use   Smoking status: Never   Smokeless tobacco: Never  Vaping Use   Vaping status: Never Used  Substance and Sexual Activity   Alcohol use: Not Currently    Alcohol/week: 0.0 - 1.0 standard drinks of alcohol    Comment: "once every 2 months"   Drug use: Yes    Types: Marijuana, "Crack" cocaine    Comment: last week   Sexual activity: Not Currently    Birth control/protection: None  Other Topics Concern   Not  on file  Social History Narrative   Lives with girlfriend "sharon"   Social Determinants of Health   Financial Resource Strain: Low Risk  (08/05/2021)   [facility]   Overall Financial Resource Strain (CARDIA)    Difficulty of Paying Living Expenses: Not hard at all  Food Insecurity: No Food Insecurity (09/19/2022)   Hunger Vital Sign    Worried About Running Out of Food in the Last Year: Never true    Ran Out of Food in the Last Year: Never true  Transportation Needs: No Transportation Needs (09/19/2022)   PRAPARE - Administrator, Civil Service (Medical): No    Lack of Transportation (Non-Medical): No  Physical Activity: Insufficiently Active (01/01/2020)   Exercise Vital Sign    Days of  Exercise per Week: 7 days    Minutes of Exercise per Session: 20 min  Stress: No Stress Concern Present (01/01/2020)   [employer] of Occupational Health - Occupational Stress Questionnaire    Feeling of Stress : Not at all  Social Connections: Moderately Isolated (01/01/2020)   Social Connection and Isolation Panel [NHANES]    Frequency of Communication with Friends and Family: More than three times a week    Frequency of Social Gatherings with Friends and Family: More than three times a week    Attends Religious Services: Never    Database administrator or Organizations: No    Attends Banker Meetings: Never    Marital Status: Living with partner  Intimate Partner Violence: Not At Risk (09/19/2022)   Humiliation, Afraid, Rape, and Kick questionnaire    Fear of Current or Ex-Partner: No    Emotionally Abused: No    Physically Abused: No    Sexually Abused: No    Family History  Problem Relation Age of Onset   Heart attack Father    Hypertension Sister    Cancer Sister      Current Outpatient Medications:    acetaminophen (TYLENOL) 500 MG tablet, Take 1,000 mg by mouth daily as needed for mild pain or moderate pain., Disp: , Rfl:    acyclovir (ZOVIRAX) 400 MG tablet, Take 1 tablet (400 mg total) by mouth 2 (two) times daily., Disp: 60 tablet, Rfl: 2   ascorbic acid (VITAMIN C) 500 MG tablet, Take 1 tablet (500 mg total) by mouth daily., Disp: 90 tablet, Rfl: 0   aspirin EC 81 MG tablet, Take 81 mg by mouth in the morning., Disp: , Rfl:    atorvastatin (LIPITOR) 80 MG tablet, Take 1 tablet (80 mg total) by mouth at bedtime. Hold while taking Paxlovid, Disp: 30 tablet, Rfl: 2   Baclofen 5 MG TABS, Take 5 mg by mouth daily as needed (muscle spasm)., Disp: , Rfl:    docusate sodium (COLACE) 100 MG capsule, Take 100 mg by mouth daily as needed for mild constipation., Disp: , Rfl:    feeding supplement, GLUCERNA SHAKE, (GLUCERNA SHAKE) LIQD, Take 237 mLs by mouth 3  (three) times daily between meals., Disp: , Rfl: 0   Glucagon (BAQSIMI ONE PACK) 3 MG/DOSE POWD, Place 3 mg into the nose once as needed for up to 1 dose (for low sugar)., Disp: 1 each, Rfl: 1   insulin degludec (TRESIBA FLEXTOUCH) 100 UNIT/ML FlexTouch Pen, Inject 8 Units into the skin daily., Disp: , Rfl:    lenalidomide (REVLIMID) 10 MG capsule, Take 1 capsule (10 mg total) by mouth daily. Take for 14 days,  then hold for 7 days. Repeat every 21 days., Disp: 14 capsule, Rfl: 0   Multiple Vitamin (MULTIVITAMIN WITH MINERALS) TABS tablet, Take 1 tablet by mouth daily., Disp: 90 tablet, Rfl: 1   NOVOLOG 100 UNIT/ML injection, Sliding Scale: Glucose 70 - 120: 0 units  Glucose 121 - 150: 1 unit  Glucose 151 - 200: 2 units Glucose 201 - 250: 3 units  Glucose 251 - 300: 5 units  Glucose 301 - 350: 7 units  Glucose 351 - 400: 9 units  Glucose > 400: call your docotr, Disp: 10 mL, Rfl: 1   ondansetron (ZOFRAN) 8 MG tablet, Take 8 mg by mouth daily as needed for nausea or vomiting., Disp: , Rfl:    pantoprazole (PROTONIX) 40 MG tablet, Take 1 tablet (40 mg total) by mouth 2 (two) times daily. Take twice daily for 2 months then decrease to once daily, Disp: , Rfl:   Physical exam:  Vitals:   11/06/22 1123  BP: 138/87  Pulse: 81  Resp: 18  Temp: (!) 97.1 F (36.2 C)  TempSrc: Tympanic  SpO2: 100%  Weight: 153 lb 11.2 oz (69.7 kg)  Height: 5\' 10"  (1.778 m)   Physical Exam Cardiovascular:     Rate and Rhythm: Normal rate and regular rhythm.     Heart sounds: Normal heart sounds.  Pulmonary:     Effort: Pulmonary effort is normal.     Breath sounds: Normal breath sounds.  Abdominal:     General: Bowel sounds are normal.     Palpations: Abdomen is soft.  Skin:    General: Skin is warm and dry.  Neurological:     Mental Status: He is alert and oriented to person, place, and time.         Latest Ref Rng & Units 11/06/2022   11:10 AM  CMP  Glucose 70 - 99 mg/dL 244   BUN 8 - 23 mg/dL 38    Creatinine 0.10 - 1.24 mg/dL 2.72   Sodium 536 - 644 mmol/L 133   Potassium 3.5 - 5.1 mmol/L 4.6   Chloride 98 - 111 mmol/L 102   CO2 22 - 32 mmol/L 24   Calcium 8.9 - 10.3 mg/dL 8.7   Total Protein 6.5 - 8.1 g/dL 7.9   Total Bilirubin 0.3 - 1.2 mg/dL 0.6   Alkaline Phos 38 - 126 U/L 54   AST 15 - 41 U/L 19   ALT 0 - 44 U/L 29       Latest Ref Rng & Units 11/06/2022   11:10 AM  CBC  WBC 4.0 - 10.5 K/uL 4.3   Hemoglobin 13.0 - 17.0 g/dL 8.9   Hematocrit 03.4 - 52.0 % 28.4   Platelets 150 - 400 K/uL 246     No images are attached to the encounter.  CT Head Wo Contrast  Result Date: 10/31/2022 CLINICAL DATA:  Mental status change, unknown cause EXAM: CT HEAD WITHOUT CONTRAST TECHNIQUE: Contiguous axial images were obtained from the base of the skull through the vertex without intravenous contrast. RADIATION DOSE REDUCTION: This exam was performed according to the departmental dose-optimization program which includes automated exposure control, adjustment of the mA and/or kV according to patient size and/or use of iterative reconstruction technique. COMPARISON:  Head CT 10/17/2022, brain MRI 10/18/2022 FINDINGS: Brain: No intracranial hemorrhage, mass effect, or midline shift. No hydrocephalus. The basilar cisterns are patent. Stable chronic small vessel ischemia. No evidence of territorial infarct or acute ischemia. Chronic dural calcifications. No  extra-axial or intracranial fluid collection. Vascular: Atherosclerosis of skullbase vasculature without hyperdense vessel or abnormal calcification. Skull: No fracture or focal lesion. Sinuses/Orbits: Paranasal sinuses and mastoid air cells are clear. The visualized orbits are unremarkable. Other: None. IMPRESSION: 1. No acute intracranial abnormality. 2. Stable chronic small vessel ischemia. Electronically Signed   By: Narda Rutherford M.D.   On: 10/31/2022 16:25   MR BRAIN WO CONTRAST  Result Date: 10/18/2022 CLINICAL DATA:  Neuro deficit with  acute stroke suspected weakness. Multiple falls. EXAM: MRI HEAD WITHOUT CONTRAST TECHNIQUE: Multiplanar, multiecho pulse sequences of the brain and surrounding structures were obtained without intravenous contrast. COMPARISON:  Head CT from yesterday.  Brain MRI 03/10/2022 FINDINGS: Brain: No acute infarction, hemorrhage, hydrocephalus, extra-axial collection or mass lesion. Small foci of mineralization along the lateral ventricles which are stable from 2023 and from uncertain remote insult. Brain volume is normal and there is minor chronic small vessel ischemia. Vascular: Normal flow voids. Skull and upper cervical spine: Normal marrow signal. Sinuses/Orbits: Negative. IMPRESSION: No acute or reversible finding. Stable brain MRI when compared to 2023. Electronically Signed   By: Tiburcio Pea M.D.   On: 10/18/2022 04:48   CT HEAD WO CONTRAST  Result Date: 10/17/2022 CLINICAL DATA:  Neuro deficit, acute, stroke suspected EXAM: CT HEAD WITHOUT CONTRAST TECHNIQUE: Contiguous axial images were obtained from the base of the skull through the vertex without intravenous contrast. RADIATION DOSE REDUCTION: This exam was performed according to the departmental dose-optimization program which includes automated exposure control, adjustment of the mA and/or kV according to patient size and/or use of iterative reconstruction technique. COMPARISON:  09/18/2022 FINDINGS: Brain: Extensive calcifications along the falx and tentorium, unchanged. No acute intracranial abnormality. Specifically, no hemorrhage, hydrocephalus, mass lesion, acute infarction, or significant intracranial injury. Vascular: No hyperdense vessel or unexpected calcification. Skull: No acute calvarial abnormality. Sinuses/Orbits: No acute findings Other: None IMPRESSION: No acute intracranial abnormality. Electronically Signed   By: Charlett Nose M.D.   On: 10/17/2022 19:09     Assessment and plan- Patient is a 72 y.o. male with R-ISS stage III high  risk IgG lambda multiple myeloma.  He is here for on treatment assessment prior to cycle 4-day 1 of Darzalex and Velcade  Patient has been on the VRD regimen since May 2024.  Prior to that he was on VRD.  Despite intensifying his treatment with the VRD regimenMyeloma panel still shows a detectable M protein of 2.3 which has not changed much over the last 1 year.  Serum free lambda light chain has been fluctuating between 68-1 37 and presently at 82 but again there is no clear declining trend.He also has a fair degree of anemia with a hemoglobin that has remained around 8.  He will proceed with Velcade and Darzalex today and will complete his Revlimid cycle over the next 2 weeks.  2 weeks from today he will only get Darzalex.  I will see him back in 4 weeks for Darzalex which she is presently getting every 2 weeks and starting 11/27/2022 I will switch him from Revlimid to Pomalyst due to lack of adequate response to D VRD treatment.  Discussed risks and benefits of Pomalyst including all but not limited to nausea, vomiting, diarrhea, low blood counts, risk of infections hospitalizations events.  Treatment will be given with a palliative intent.  Patient understands and agrees to proceed as planned.  Patient will continue to remain on acyclovir prophylaxis as well as low-dose aspirin.  Patient does not have  any active lytic lesions from multiple myeloma and has not been on bisphosphonates so far.  I will consider getting a repeat PET scan this year when I see him at my next visit  Normocytic anemia: He will receive Retacrit today.  Likely secondary to anemia of chronic kidney disease.  If anemia fails to improve despite changing him to Pomalyst and Retacrit I will consider getting a bone marrow biopsy.   Visit Diagnosis 1. High risk medication use   2. Multiple myeloma not having achieved remission (HCC)   3. Encounter for antineoplastic chemotherapy   4. Erythropoietin (EPO) stimulating agent anemia  management patient      Dr. Owens Shark, MD, MPH [facility] [phone] 11/08/2022 7:37 AM

## 2022-11-08 NOTE — Addendum Note (Signed)
Addended by: Owens Shark C on: 11/08/2022 07:46 AM   Modules accepted: Level of Service

## 2022-11-09 ENCOUNTER — Encounter: Payer: Self-pay | Admitting: Oncology

## 2022-11-09 ENCOUNTER — Other Ambulatory Visit (HOSPITAL_COMMUNITY): Payer: Self-pay

## 2022-11-09 ENCOUNTER — Telehealth: Payer: Self-pay

## 2022-11-09 NOTE — Telephone Encounter (Signed)
Oral Oncology Patient Advocate Encounter  Prior Authorization for Pomalyst has been approved.    PA# [id number]  Effective dates: 11/09/22 through 03/30/23  Patients co-pay is $0.00.    Ardeen Fillers, CPhT Oncology Pharmacy Patient Advocate  [facility]  [phone] (phone) [phone] (fax) 11/09/2022 8:35 AM

## 2022-11-09 NOTE — Telephone Encounter (Signed)
Oral Oncology Patient Advocate Encounter  New authorization   Received notification that prior authorization for Pomalyst is required.   PA submitted on 11/09/22  Key [id number]  Status is pending     Ardeen Fillers, CPhT Oncology Pharmacy Patient Advocate  [facility]  [phone] (phone) [phone] (fax) 11/09/2022 8:08 AM

## 2022-11-09 NOTE — Telephone Encounter (Signed)
Oral Chemotherapy Pharmacist Encounter   Received new prescription for Pomalyst (pomalidomide) for the treatment of IgG lambda multiple myeloma in conjunction with daratumumab, planned duration until disease progression or unacceptable drug toxicity. Patient switching to pomalidomide therapy due to inadequate response to D VRD. Planned start pomalidomide for 11/27/22.   Labs from 11/06/22 assessed, no baseline dose adjustments required. Prescription dose and frequency assessed.  Current medication list in Epic reviewed, no DDIs with pomalidomide identified.   Evaluated chart and identified patient barriers to medication adherence. PMH uncontrolled type 1 diabetes. Patient with frequent inpatient admissions due to hypoglycemia/hypotension which have lead to several treatment interruptions to therapy.   Prescription has been e-scribed to the pharmacy for benefits analysis and approval.  Oral Oncology Clinic will continue to follow for insurance authorization, copayment issues, initial counseling and start date.   Jerry Caras, PharmD PGY2 Oncology Pharmacy Resident   11/09/2022 3:06 PM

## 2022-11-13 ENCOUNTER — Other Ambulatory Visit: Payer: Self-pay

## 2022-11-13 ENCOUNTER — Encounter: Payer: Self-pay | Admitting: Medical Oncology

## 2022-11-13 ENCOUNTER — Inpatient Hospital Stay: Payer: 59

## 2022-11-13 ENCOUNTER — Inpatient Hospital Stay: Payer: 59 | Admitting: Medical Oncology

## 2022-11-13 ENCOUNTER — Observation Stay
Admission: EM | Admit: 2022-11-13 | Discharge: 2022-11-15 | Disposition: A | Discharge status: Home / Self Care | Payer: 59 | Attending: Hospitalist | Admitting: Hospitalist

## 2022-11-13 ENCOUNTER — Other Ambulatory Visit: Payer: Self-pay | Admitting: *Deleted

## 2022-11-13 VITALS — BP 106/54 | HR 80 | Temp 97.7°F

## 2022-11-13 DIAGNOSIS — R253 Fasciculation: Secondary | ICD-10-CM

## 2022-11-13 DIAGNOSIS — N183 Chronic kidney disease, stage 3 unspecified: Secondary | ICD-10-CM | POA: Diagnosis present

## 2022-11-13 DIAGNOSIS — E1021 Type 1 diabetes mellitus with diabetic nephropathy: Secondary | ICD-10-CM

## 2022-11-13 DIAGNOSIS — C9000 Multiple myeloma not having achieved remission: Secondary | ICD-10-CM | POA: Diagnosis not present

## 2022-11-13 DIAGNOSIS — D631 Anemia in chronic kidney disease: Secondary | ICD-10-CM

## 2022-11-13 DIAGNOSIS — R4182 Altered mental status, unspecified: Secondary | ICD-10-CM

## 2022-11-13 DIAGNOSIS — E162 Hypoglycemia, unspecified: Principal | ICD-10-CM | POA: Diagnosis present

## 2022-11-13 DIAGNOSIS — R531 Weakness: Secondary | ICD-10-CM

## 2022-11-13 DIAGNOSIS — E1022 Type 1 diabetes mellitus with diabetic chronic kidney disease: Secondary | ICD-10-CM | POA: Insufficient documentation

## 2022-11-13 DIAGNOSIS — E1040 Type 1 diabetes mellitus with diabetic neuropathy, unspecified: Secondary | ICD-10-CM

## 2022-11-13 DIAGNOSIS — Z7982 Long term (current) use of aspirin: Secondary | ICD-10-CM | POA: Diagnosis not present

## 2022-11-13 DIAGNOSIS — E10649 Type 1 diabetes mellitus with hypoglycemia without coma: Principal | ICD-10-CM | POA: Insufficient documentation

## 2022-11-13 DIAGNOSIS — I129 Hypertensive chronic kidney disease with stage 1 through stage 4 chronic kidney disease, or unspecified chronic kidney disease: Secondary | ICD-10-CM | POA: Insufficient documentation

## 2022-11-13 DIAGNOSIS — N1832 Chronic kidney disease, stage 3b: Secondary | ICD-10-CM | POA: Diagnosis not present

## 2022-11-13 DIAGNOSIS — Z79899 Other long term (current) drug therapy: Secondary | ICD-10-CM | POA: Insufficient documentation

## 2022-11-13 DIAGNOSIS — E1065 Type 1 diabetes mellitus with hyperglycemia: Secondary | ICD-10-CM

## 2022-11-13 DIAGNOSIS — R42 Dizziness and giddiness: Secondary | ICD-10-CM | POA: Diagnosis not present

## 2022-11-13 DIAGNOSIS — Z794 Long term (current) use of insulin: Secondary | ICD-10-CM | POA: Insufficient documentation

## 2022-11-13 DIAGNOSIS — E109 Type 1 diabetes mellitus without complications: Secondary | ICD-10-CM | POA: Diagnosis present

## 2022-11-13 DIAGNOSIS — R404 Transient alteration of awareness: Secondary | ICD-10-CM | POA: Insufficient documentation

## 2022-11-13 DIAGNOSIS — Z5112 Encounter for antineoplastic immunotherapy: Secondary | ICD-10-CM | POA: Diagnosis not present

## 2022-11-13 LAB — CMP (CANCER CENTER ONLY)
ALT: 54 U/L — ABNORMAL HIGH (ref 0–44)
AST: 35 U/L (ref 15–41)
Albumin: 3.6 g/dL (ref 3.5–5.0)
Alkaline Phosphatase: 51 U/L (ref 38–126)
Anion gap: 8 (ref 5–15)
BUN: 49 mg/dL — ABNORMAL HIGH (ref 8–23)
CO2: 21 mmol/L — ABNORMAL LOW (ref 22–32)
Calcium: 8.9 mg/dL (ref 8.9–10.3)
Chloride: 107 mmol/L (ref 98–111)
Creatinine: 2.08 mg/dL — ABNORMAL HIGH (ref 0.61–1.24)
GFR, Estimated: 34 mL/min — ABNORMAL LOW (ref >60)
Glucose, Bld: 36 mg/dL — CL (ref 70–99)
Potassium: 3.9 mmol/L (ref 3.5–5.1)
Sodium: 136 mmol/L (ref 135–145)
Total Bilirubin: 0.2 mg/dL — ABNORMAL LOW (ref 0.3–1.2)
Total Protein: 8.2 g/dL — ABNORMAL HIGH (ref 6.5–8.1)

## 2022-11-13 LAB — COMPREHENSIVE METABOLIC PANEL
ALT: 52 U/L — ABNORMAL HIGH (ref 0–44)
AST: 35 U/L (ref 15–41)
Albumin: 3.3 g/dL — ABNORMAL LOW (ref 3.5–5.0)
Alkaline Phosphatase: 43 U/L (ref 38–126)
Anion gap: 7 (ref 5–15)
BUN: 45 mg/dL — ABNORMAL HIGH (ref 8–23)
CO2: 23 mmol/L (ref 22–32)
Calcium: 8.8 mg/dL — ABNORMAL LOW (ref 8.9–10.3)
Chloride: 107 mmol/L (ref 98–111)
Creatinine, Ser: 2.06 mg/dL — ABNORMAL HIGH (ref 0.61–1.24)
GFR, Estimated: 34 mL/min — ABNORMAL LOW (ref >60)
Glucose, Bld: 53 mg/dL — ABNORMAL LOW (ref 70–99)
Potassium: 4 mmol/L (ref 3.5–5.1)
Sodium: 137 mmol/L (ref 135–145)
Total Bilirubin: 0.5 mg/dL (ref 0.3–1.2)
Total Protein: 8.3 g/dL — ABNORMAL HIGH (ref 6.5–8.1)

## 2022-11-13 LAB — CBC WITH DIFFERENTIAL/PLATELET
Abs Immature Granulocytes: 0.04 10*3/uL (ref 0.00–0.07)
Basophils Absolute: 0 10*3/uL (ref 0.0–0.1)
Basophils Relative: 0 %
Eosinophils Absolute: 0.2 10*3/uL (ref 0.0–0.5)
Eosinophils Relative: 3 %
HCT: 30.7 % — ABNORMAL LOW (ref 39.0–52.0)
Hemoglobin: 9.4 g/dL — ABNORMAL LOW (ref 13.0–17.0)
Immature Granulocytes: 1 %
Lymphocytes Relative: 37 %
Lymphs Abs: 1.9 10*3/uL (ref 0.7–4.0)
MCH: 30.6 pg (ref 26.0–34.0)
MCHC: 30.6 g/dL (ref 30.0–36.0)
MCV: 100 fL (ref 80.0–100.0)
Monocytes Absolute: 0.5 10*3/uL (ref 0.1–1.0)
Monocytes Relative: 9 %
Neutro Abs: 2.6 10*3/uL (ref 1.7–7.7)
Neutrophils Relative %: 50 %
Platelets: 287 10*3/uL (ref 150–400)
RBC: 3.07 MIL/uL — ABNORMAL LOW (ref 4.22–5.81)
RDW: 16.6 % — ABNORMAL HIGH (ref 11.5–15.5)
WBC: 5.2 10*3/uL (ref 4.0–10.5)
nRBC: 0.4 % — ABNORMAL HIGH (ref 0.0–0.2)

## 2022-11-13 LAB — CBG MONITORING, ED
Glucose-Capillary: 208 mg/dL — ABNORMAL HIGH (ref 70–99)
Glucose-Capillary: 49 mg/dL — ABNORMAL LOW (ref 70–99)
Glucose-Capillary: 152 mg/dL — ABNORMAL HIGH (ref 70–99)
Glucose-Capillary: 52 mg/dL — ABNORMAL LOW (ref 70–99)
Glucose-Capillary: 61 mg/dL — ABNORMAL LOW (ref 70–99)
Glucose-Capillary: 127 mg/dL — ABNORMAL HIGH (ref 70–99)
Glucose-Capillary: 236 mg/dL — ABNORMAL HIGH (ref 70–99)
Glucose-Capillary: 261 mg/dL — ABNORMAL HIGH (ref 70–99)

## 2022-11-13 LAB — GLUCOSE, CAPILLARY
Glucose-Capillary: 214 mg/dL — ABNORMAL HIGH (ref 70–99)
Glucose-Capillary: 317 mg/dL — ABNORMAL HIGH (ref 70–99)

## 2022-11-13 LAB — CBC WITH DIFFERENTIAL (CANCER CENTER ONLY)
Abs Immature Granulocytes: 0.03 10*3/uL (ref 0.00–0.07)
Basophils Absolute: 0 10*3/uL (ref 0.0–0.1)
Basophils Relative: 0 %
Eosinophils Absolute: 0.2 10*3/uL (ref 0.0–0.5)
Eosinophils Relative: 3 %
HCT: 30.1 % — ABNORMAL LOW (ref 39.0–52.0)
Hemoglobin: 9.4 g/dL — ABNORMAL LOW (ref 13.0–17.0)
Immature Granulocytes: 1 %
Lymphocytes Relative: 35 %
Lymphs Abs: 1.6 10*3/uL (ref 0.7–4.0)
MCH: 30.9 pg (ref 26.0–34.0)
MCHC: 31.2 g/dL (ref 30.0–36.0)
MCV: 99 fL (ref 80.0–100.0)
Monocytes Absolute: 0.5 10*3/uL (ref 0.1–1.0)
Monocytes Relative: 10 %
Neutro Abs: 2.3 10*3/uL (ref 1.7–7.7)
Neutrophils Relative %: 51 %
Platelet Count: 327 10*3/uL (ref 150–400)
RBC: 3.04 MIL/uL — ABNORMAL LOW (ref 4.22–5.81)
RDW: 16.5 % — ABNORMAL HIGH (ref 11.5–15.5)
WBC Count: 4.5 10*3/uL (ref 4.0–10.5)
nRBC: 0 % (ref 0.0–0.2)

## 2022-11-13 LAB — SAMPLE TO BLOOD BANK

## 2022-11-13 LAB — LIPASE, BLOOD: Lipase: 32 U/L (ref 11–51)

## 2022-11-13 MED ORDER — ACETAMINOPHEN 325 MG PO TABS
650.0000 mg | ORAL_TABLET | Freq: Four times a day (QID) | ORAL | Status: DC | PRN
  Administered 2022-11-14 – 2022-11-15 (×2): 650 mg via ORAL
  Filled 2022-11-13 (×2): qty 2

## 2022-11-13 MED ORDER — DEXTROSE 50 % IV SOLN: 1.0000 | Freq: Once | INTRAVENOUS | Status: AC

## 2022-11-13 MED ORDER — ACYCLOVIR 200 MG PO CAPS
400.0000 mg | ORAL_CAPSULE | Freq: Two times a day (BID) | ORAL | Status: DC
  Administered 2022-11-13 – 2022-11-15 (×4): 400 mg via ORAL
  Filled 2022-11-13 (×4): qty 2

## 2022-11-13 MED ORDER — DEXTROSE 10 % IV SOLN: INTRAVENOUS | Status: DC

## 2022-11-13 MED ORDER — ONDANSETRON HCL 4 MG PO TABS: 4.0000 mg | ORAL_TABLET | Freq: Four times a day (QID) | ORAL | Status: DC | PRN

## 2022-11-13 MED ORDER — ENOXAPARIN SODIUM 40 MG/0.4ML IJ SOSY
40.0000 mg | PREFILLED_SYRINGE | INTRAMUSCULAR | Status: DC
  Administered 2022-11-13 – 2022-11-14 (×2): 40 mg via SUBCUTANEOUS
  Filled 2022-11-13 (×2): qty 0.4

## 2022-11-13 MED ORDER — SODIUM CHLORIDE 0.9% FLUSH
3.0000 mL | Freq: Two times a day (BID) | INTRAVENOUS | Status: DC
  Administered 2022-11-13 – 2022-11-15 (×4): 3 mL via INTRAVENOUS

## 2022-11-13 MED ORDER — ONDANSETRON HCL 4 MG/2ML IJ SOLN: 4.0000 mg | Freq: Four times a day (QID) | INTRAMUSCULAR | Status: DC | PRN

## 2022-11-13 MED ORDER — INSULIN ASPART 100 UNIT/ML IJ SOLN: 0.0000 [IU] | Freq: Three times a day (TID) | INTRAMUSCULAR | Status: DC

## 2022-11-13 MED ORDER — POLYETHYLENE GLYCOL 3350 17 G PO PACK: 17.0000 g | PACK | Freq: Every day | ORAL | Status: DC | PRN

## 2022-11-13 MED ORDER — PANTOPRAZOLE SODIUM 40 MG PO TBEC
40.0000 mg | DELAYED_RELEASE_TABLET | Freq: Two times a day (BID) | ORAL | Status: DC
  Administered 2022-11-13 – 2022-11-15 (×4): 40 mg via ORAL
  Filled 2022-11-13 (×4): qty 1

## 2022-11-13 MED ORDER — ACETAMINOPHEN 650 MG RE SUPP: 650.0000 mg | Freq: Four times a day (QID) | RECTAL | Status: DC | PRN

## 2022-11-13 MED ORDER — ATORVASTATIN CALCIUM 20 MG PO TABS
80.0000 mg | ORAL_TABLET | Freq: Every day | ORAL | Status: DC
  Administered 2022-11-13 – 2022-11-14 (×2): 80 mg via ORAL
  Filled 2022-11-13 (×2): qty 4

## 2022-11-13 MED ORDER — ASPIRIN 81 MG PO TBEC
81.0000 mg | DELAYED_RELEASE_TABLET | Freq: Every day | ORAL | Status: DC
  Administered 2022-11-14 – 2022-11-15 (×2): 81 mg via ORAL
  Filled 2022-11-13 (×2): qty 1

## 2022-11-13 MED ORDER — LENALIDOMIDE 10 MG PO CAPS: 10.0000 mg | ORAL_CAPSULE | Freq: Every day | ORAL | Status: DC

## 2022-11-13 MED ORDER — DEXTROSE 50 % IV SOLN
INTRAVENOUS | Status: AC
  Administered 2022-11-13: 50 mL via INTRAVENOUS
  Filled 2022-11-13: qty 50

## 2022-11-13 NOTE — H&P (Signed)
History and Physical    Patient: Nicholas Weiss ZOX:[id number] DOB: [birth year] DOA: 11/13/2022 DOS: the patient was seen and examined on 11/13/2022 PCP: Marisue Ivan, MD  Patient coming from: Home  Chief Complaint:  Chief Complaint  Patient presents with   Hypoglycemia   HPI: Nicholas Weiss is a 72 y.o. male with medical history significant of stage III high risk IgG multiple myeloma, type 1 diabetes, hypertension, hyperlipidemia, stage IIIb chronic kidney disease who presents to the ED due to hypoglycemia.  Nicholas Weiss states that he was in his usual state of health today when he went to his oncologist office.  While walking in the, he noticed that his balance seemed off and he had 3 falls during which she did not hit his head or hurt anything.  Under saw him and wheeled him into the oncologist office where they noted that he seemed altered.  He was told at that time that his sugars were extremely low and he needed to come to the ER.  He states that otherwise, he denies any acute symptoms at this time including fever, chills, nausea, vomiting, diarrhea, abdominal pain, chest pain, palpitations or shortness of breath.  He notes that he has been having balance issues for the last 4 months that are worse when he gets up in the morning and occurs sometimes throughout the day but self resolved.  ED course: On arrival to the ED, patient was normotensive at 126/84 with heart rate of 74.  He was saturating at 100% on room air.  He was afebrile at 97.8.  Initial CBG on arrival was 36 with increased to 53.  After D50 and eating, patient's sugar increased to 208 then began to downtrend to 152 and 52.  Additional workup notable for hemoglobin of 9.4, glucose 53, BUN 45, creatinine 2.06 with GFR of 34.  Patient started on D10 infusion and [facility] contacted for admission.  Review of Systems: As mentioned in the history of present illness. All other systems reviewed and are negative.  Past Medical  History:  Diagnosis Date   Acute metabolic encephalopathy 12/02/2020   DKA (diabetic ketoacidoses) 04/06/2016   Hypercholesteremia    Hypertension    Past Surgical History:  Procedure Laterality Date   AMPUTATION TOE Left 04/25/2022   Procedure: LEFT GREAT TOE AMPUTATION AND LEFT PARTIAL 2ND TOE AMPUTATION;  Surgeon: Felecia Shelling, DPM;  Location: [facility] ORS;  Service: Podiatry;  Laterality: Left;   COLONOSCOPY WITH PROPOFOL N/A 02/01/2020   Procedure: COLONOSCOPY WITH PROPOFOL;  Surgeon: Toney Reil, MD;  Location: [facility] ENDOSCOPY;  Service: Gastroenterology;  Laterality: N/A;   ESOPHAGOGASTRODUODENOSCOPY  02/01/2020   Procedure: ESOPHAGOGASTRODUODENOSCOPY (EGD);  Surgeon: Toney Reil, MD;  Location: [facility] ENDOSCOPY;  Service: Gastroenterology;;   ESOPHAGOGASTRODUODENOSCOPY (EGD) WITH PROPOFOL N/A 06/07/2022   Procedure: ESOPHAGOGASTRODUODENOSCOPY (EGD) WITH PROPOFOL;  Surgeon: Kerin Salen, MD;  Location: [facility] ENDOSCOPY;  Service: Gastroenterology;  Laterality: N/A;   KNEE SURGERY Right    Torn meniscus   KNEE SURGERY Left    Social History:  reports that he has never smoked. He has never used smokeless tobacco. He reports that he does not currently use alcohol. He reports current drug use. Drugs: Marijuana and "Crack" cocaine.  Allergies  Allergen Reactions   Penicillins Other (See Comments)    Childhood allergy -tolerated amoxil 03/2022 Unknown reaction Has patient had a PCN reaction causing immediate rash, facial/tongue/throat swelling, SOB or lightheadedness with hypotension: No Has patient had a PCN reaction causing severe rash  involving mucus membranes or skin necrosis: No Has patient had a PCN reaction that required hospitalization No Has patient had a PCN reaction occurring within the last 10 years: No If all of the above answers are "NO", then may proceed with Cephalosporin use.     Family History  Problem Relation Age of Onset   Heart attack Father     Hypertension Sister    Cancer Sister     Prior to Admission medications   Medication Sig Start Date End Date Taking? Authorizing Provider  acetaminophen (TYLENOL) 500 MG tablet Take 1,000 mg by mouth daily as needed for mild pain or moderate pain.    [provider]  acyclovir (ZOVIRAX) 400 MG tablet Take 1 tablet (400 mg total) by mouth 2 (two) times daily. 08/06/22   Creig Hines, MD  ascorbic acid (VITAMIN C) 500 MG tablet Take 1 tablet (500 mg total) by mouth daily. 04/09/22   Arnetha Courser, MD  aspirin EC 81 MG tablet Take 81 mg by mouth in the morning.    [provider]  atorvastatin (LIPITOR) 80 MG tablet Take 1 tablet (80 mg total) by mouth at bedtime. Hold while taking Paxlovid 04/08/22   Arnetha Courser, MD  Baclofen 5 MG TABS Take 5 mg by mouth daily as needed (muscle spasm).    [provider]  docusate sodium (COLACE) 100 MG capsule Take 100 mg by mouth daily as needed for mild constipation.    [provider]  feeding supplement, GLUCERNA SHAKE, (GLUCERNA SHAKE) LIQD Take 237 mLs by mouth 3 (three) times daily between meals. 04/29/22   Pennie Banter, DO  Glucagon (BAQSIMI ONE PACK) 3 MG/DOSE POWD Place 3 mg into the nose once as needed for up to 1 dose (for low sugar). 07/22/22   Concha Se, MD  insulin degludec (TRESIBA FLEXTOUCH) 100 UNIT/ML FlexTouch Pen Inject 8 Units into the skin daily. 08/26/22   Marrion Coy, MD  lenalidomide (REVLIMID) 10 MG capsule Take 1 capsule (10 mg total) by mouth daily. Take for 14 days, then hold for 7 days. Repeat every 21 days. 10/09/22   Creig Hines, MD  Multiple Vitamin (MULTIVITAMIN WITH MINERALS) TABS tablet Take 1 tablet by mouth daily. 04/09/22   Arnetha Courser, MD  NOVOLOG 100 UNIT/ML injection Sliding Scale: Glucose 70 - 120: 0 units  Glucose 121 - 150: 1 unit  Glucose 151 - 200: 2 units Glucose 201 - 250: 3 units  Glucose 251 - 300: 5 units  Glucose 301 - 350: 7 units  Glucose 351 - 400: 9 units   Glucose > 400: call your docotr 06/12/22   Jerald Kief, MD  ondansetron (ZOFRAN) 8 MG tablet Take 8 mg by mouth daily as needed for nausea or vomiting.    [provider]  pantoprazole (PROTONIX) 40 MG tablet Take 1 tablet (40 mg total) by mouth 2 (two) times daily. Take twice daily for 2 months then decrease to once daily 09/20/22   Loyce Dys, MD    Physical Exam: Vitals:   11/13/22 1630 11/13/22 1823 11/13/22 1830 11/13/22 1900  BP: (!) 186/93 (!) 131/109 (!) 157/101 (!) 172/103  Pulse:  75 74 73  Resp:  18 18   Temp:      SpO2:  100% 99% 99%  Weight:      Height:       Physical Exam Vitals and nursing note reviewed.  Constitutional:      General: He  is not in acute distress.    Appearance: He is normal weight. He is not toxic-appearing.  HENT:     Head: Normocephalic and atraumatic.     Mouth/Throat:     Mouth: Mucous membranes are moist.     Pharynx: Oropharynx is clear.  Eyes:     Conjunctiva/sclera: Conjunctivae normal.     Pupils: Pupils are equal, round, and reactive to light.  Cardiovascular:     Rate and Rhythm: Normal rate and regular rhythm.     Heart sounds: No murmur heard.    No gallop.  Pulmonary:     Effort: Pulmonary effort is normal. No respiratory distress.     Breath sounds: Normal breath sounds. No wheezing or rhonchi.  Abdominal:     General: Bowel sounds are normal. There is no distension.     Palpations: Abdomen is soft.     Tenderness: There is no abdominal tenderness. There is no guarding.  Musculoskeletal:     Right lower leg: No edema.     Left lower leg: No edema.  Skin:    General: Skin is warm and dry.  Neurological:     Mental Status: He is alert and oriented to person, place, and time. Mental status is at baseline.  Psychiatric:        Mood and Affect: Mood normal.        Behavior: Behavior normal.    Data Reviewed: CBC with WBC of 5.2, hemoglobin 9.4, platelets of 287 CMP with sodium of 137, potassium 4.0,  bicarb 23, glucose 53, BUN 45, creatinine 2.06, calcium 8.8, albumin 3.3, AST 35, ALT 52 and GFR of 34  There are no new results to review at this time.  Assessment and Plan:  * Hypoglycemia Patient is presenting today after an episode of hypoglycemia at his oncologist office.  Despite receiving D10 50 and a full meal in the ED, patient's blood sugars continue to drop.  He notes that today, he took 13 units of Humalog and 15 units of [place].   - Continue D10 overnight - CBG every 4 hours - Monitor closely for hyperglycemia, and if so start SSI, very sensitive  Type 1 diabetes mellitus (HCC) Per chart review, patient has a history of type 1 diabetes diagnosed when he was in his mid 84s with recurrent episodes of DKA since. Anti-GAD elevated at 11.8 and c-peptide <0.01 when checked in 2023.   - SSI, very sensitive - Resume home [place] tomorrow if blood sugars have normalized  Dizziness Patient reports intermittent dizziness for 4 months, that is worse in the morning and occasionally throughout the day, generally self resolving.  Given his hypoglycemic this time, I am wondering if he has episodes of hypoglycemia throughout the day.  He would highly benefit from a continuous glucose monitor.  Multiple myeloma (HCC) Has a history of stage III multiple myeloma, currently following with Dr. Smith Robert.  CKD (chronic kidney disease) stage 3, GFR 30-59 ml/min (HCC) Renal function currently at baseline.  No further monitoring at this time.   Advance Care Planning:   Code Status: Full Code   Consults: None  Family Communication: No family at bedside.  Severity of Illness: The appropriate patient status for this patient is OBSERVATION. Observation status is judged to be reasonable and necessary in order to provide the required intensity of service to ensure the patient's safety. The patient's presenting symptoms, physical exam findings, and initial radiographic and laboratory data in the context  of their medical condition  is felt to place them at decreased risk for further clinical deterioration. Furthermore, it is anticipated that the patient will be medically stable for discharge from the hospital within 2 midnights of admission.   Author: Verdene Lennert, MD 11/13/2022 7:51 PM  For on call review [email or web address].

## 2022-11-13 NOTE — ED Provider Notes (Signed)
Care of this patient assumed from prior physician at 1500 pending blood glucose monitoring and disposition. Please see prior physician note for further details.  Briefly this is a 72 year old male presenting from the cancer center for hypoglycemia.  Was altered there was glucose in the 30s.  Patient had taken his insulin without eating.  He was given juice and applesauce as well as cola.  He was brought to the ER.  For EMS patient was found to have a CBG of 112.  Here, patient had recurrent hypoglycemia with a blood glucose of 53 and was given an amp of D50 as well as orange juice.  His initial repeat blood glucose improved to 208, but then an hour later had declined to 152.  This was repeated about an hour following that and had once again dropped down to 52.  Patient was reevaluated and did have normal mental status so he was given a food tray.  Given his multiple episodes of recurrent hypoglycemia, I did strongly encourage admission with consideration of initiation of a D10 drip.  Patient initially reluctant to do so.  However, after eating his food tray, his blood glucose was repeated and only minimally improved to 61.  After further discussion, patient is now agreeable to admission given his recurrent episodes of hypoglycemia.  I have ordered a D10 W drip.  Will reach out to hospitalist team to discuss admission.  Case reviewed with Dr. Huel Cote.  She will evaluate the patient for anticipated admission.   Trinna Post, MD 11/13/22 2010

## 2022-11-13 NOTE — Assessment & Plan Note (Signed)
Has a history of stage III multiple myeloma, currently following with Dr. Smith Robert.

## 2022-11-13 NOTE — Assessment & Plan Note (Addendum)
Patient is presenting today after an episode of hypoglycemia at his oncologist office.  Despite receiving D10 50 and a full meal in the ED, patient's blood sugars continue to drop.  He notes that today, he took 13 units of Humalog and 15 units of [place].   - Continue D10 overnight - CBG every 4 hours - Monitor closely for hyperglycemia, and if so start SSI, very sensitive

## 2022-11-13 NOTE — ED Triage Notes (Signed)
Pt to ED [facility] from cancer center, pt was falling in parking lot, cancer staff got cbg in 30s. Pt denies LOC or hitting head. Pt reports did not eat this morning. Pt alert and oriented. Last CBG 112 with EMS

## 2022-11-13 NOTE — ED Notes (Signed)
Pt hooked back up to monitors.

## 2022-11-13 NOTE — Assessment & Plan Note (Addendum)
Patient reports intermittent dizziness for 4 months, that is worse in the morning and occasionally throughout the day, generally self resolving.  Given his hypoglycemic this time, I am wondering if he has episodes of hypoglycemia throughout the day.  He would highly benefit from a continuous glucose monitor.

## 2022-11-13 NOTE — Progress Notes (Signed)
Symptom Management Clinic [facility] at [facility] Telephone:([phone]) [phone] [phone]  Patient Care Team: Marisue Ivan, MD as PCP - General (Family Medicine) Pa, [facility] (Optometry) Creig Hines, MD as Consulting Physician (Oncology)   Name of the patient: Nicholas Weiss  [id number]  12-Feb-1951   Oncological History:  High risk IgG lambda multiple myeloma R-ISS stage III     Current Treatment:  Darzalex and velcade   Date of visit: 11/13/22  Reason for Consult: Nicholas Weiss is a 72 y.o. male who was brought into our clinic from the parking lot where he fell:  Patient brought in and assessed by Consuello Masse NP. Labs obtained and patient was scheduled with myself.   I was alerted by my nursing staff that he appeared altered. Upon entering the room patient had visible twitching of his head and arms. He lacked some motor control of his head. He was able to state that he was feeling well. He was asked if he took his medication this morning to which he reports a yes. He reported that he took one of his DM2 pills. He denied any ETOH, marijuana or other drug use today. He denies eating or drinking today. No GI upset. No chest pain. He reported that he drove himself here and "feels good".   PAST MEDICAL HISTORY: Past Medical History:  Diagnosis Date   Acute metabolic encephalopathy 12/02/2020   DKA (diabetic ketoacidoses) 04/06/2016   Hypercholesteremia    Hypertension     PAST SURGICAL HISTORY:  Past Surgical History:  Procedure Laterality Date   AMPUTATION TOE Left 04/25/2022   Procedure: LEFT GREAT TOE AMPUTATION AND LEFT PARTIAL 2ND TOE AMPUTATION;  Surgeon: Felecia Shelling, DPM;  Location: [facility] ORS;  Service: Podiatry;  Laterality: Left;   COLONOSCOPY WITH PROPOFOL N/A 02/01/2020   Procedure: COLONOSCOPY WITH PROPOFOL;  Surgeon: Toney Reil, MD;  Location: [facility] ENDOSCOPY;  Service: Gastroenterology;  Laterality: N/A;    ESOPHAGOGASTRODUODENOSCOPY  02/01/2020   Procedure: ESOPHAGOGASTRODUODENOSCOPY (EGD);  Surgeon: Toney Reil, MD;  Location: [facility] ENDOSCOPY;  Service: Gastroenterology;;   ESOPHAGOGASTRODUODENOSCOPY (EGD) WITH PROPOFOL N/A 06/07/2022   Procedure: ESOPHAGOGASTRODUODENOSCOPY (EGD) WITH PROPOFOL;  Surgeon: Kerin Salen, MD;  Location: [facility] ENDOSCOPY;  Service: Gastroenterology;  Laterality: N/A;   KNEE SURGERY Right    Torn meniscus   KNEE SURGERY Left     HEMATOLOGY/ONCOLOGY HISTORY:  Oncology History  Multiple myeloma (HCC)  09/29/2021 Initial Diagnosis   Multiple myeloma (HCC)   09/29/2021 Cancer Staging   Staging form: Plasma Cell Myeloma and Plasma Cell Disorders, AJCC 8th Edition - Clinical stage from 09/29/2021: No stage assigned - Signed by Creig Hines, MD on 09/29/2021 Stage prefix: Initial diagnosis   10/17/2021 Cancer Staging   Staging form: Plasma Cell Myeloma and Plasma Cell Disorders, AJCC 8th Edition - Clinical stage from 10/17/2021: RISS Stage III (Beta-2-microglobulin (mg/L): 5.8, Albumin (g/dL): 3.3, ISS: Stage III, High-risk cytogenetics: Present, LDH: Normal) - Signed by Creig Hines, MD on 10/17/2021 Stage prefix: Initial diagnosis Beta 2 microglobulin range (mg/L): Greater than or equal to 5.5 Albumin range (g/dL): Less than 3.5 Cytogenetics: 1q addition, t(4;14) translocation Serum calcium level: Normal Serum creatinine level: Elevated   Multiple myeloma not having achieved remission (HCC)  09/29/2021 Initial Diagnosis   Multiple myeloma not having achieved remission (HCC)   10/03/2021 - 11/14/2021 Chemotherapy   Patient is on Treatment Plan : MYELOMA NON-TRANSPLANT CANDIDATES VRd weekly q21d     11/21/2021 -  04/03/2022 Chemotherapy   Patient is on Treatment Plan : MYELOMA NON-TRANSPLANT CANDIDATES VRd weekly q21d     08/06/2022 -  Chemotherapy   Patient is on Treatment Plan : MYELOMA NEWLY DIAGNOSED TRANSPLANT CANDIDATE DaraVRd (Daratumumab SQ) q21d x 6 Cycles  (Induction/Consolidation)       ALLERGIES:  is allergic to penicillins.  MEDICATIONS:  Current Outpatient Medications  Medication Sig Dispense Refill   acetaminophen (TYLENOL) 500 MG tablet Take 1,000 mg by mouth daily as needed for mild pain or moderate pain.     acyclovir (ZOVIRAX) 400 MG tablet Take 1 tablet (400 mg total) by mouth 2 (two) times daily. 60 tablet 2   ascorbic acid (VITAMIN C) 500 MG tablet Take 1 tablet (500 mg total) by mouth daily. 90 tablet 0   aspirin EC 81 MG tablet Take 81 mg by mouth in the morning.     atorvastatin (LIPITOR) 80 MG tablet Take 1 tablet (80 mg total) by mouth at bedtime. Hold while taking Paxlovid 30 tablet 2   Baclofen 5 MG TABS Take 5 mg by mouth daily as needed (muscle spasm).     docusate sodium (COLACE) 100 MG capsule Take 100 mg by mouth daily as needed for mild constipation.     feeding supplement, GLUCERNA SHAKE, (GLUCERNA SHAKE) LIQD Take 237 mLs by mouth 3 (three) times daily between meals.  0   Glucagon (BAQSIMI ONE PACK) 3 MG/DOSE POWD Place 3 mg into the nose once as needed for up to 1 dose (for low sugar). 1 each 1   insulin degludec (TRESIBA FLEXTOUCH) 100 UNIT/ML FlexTouch Pen Inject 8 Units into the skin daily.     lenalidomide (REVLIMID) 10 MG capsule Take 1 capsule (10 mg total) by mouth daily. Take for 14 days, then hold for 7 days. Repeat every 21 days. 14 capsule 0   Multiple Vitamin (MULTIVITAMIN WITH MINERALS) TABS tablet Take 1 tablet by mouth daily. 90 tablet 1   NOVOLOG 100 UNIT/ML injection Sliding Scale: Glucose 70 - 120: 0 units  Glucose 121 - 150: 1 unit  Glucose 151 - 200: 2 units Glucose 201 - 250: 3 units  Glucose 251 - 300: 5 units  Glucose 301 - 350: 7 units  Glucose 351 - 400: 9 units  Glucose > 400: call your docotr 10 mL 1   ondansetron (ZOFRAN) 8 MG tablet Take 8 mg by mouth daily as needed for nausea or vomiting.     pantoprazole (PROTONIX) 40 MG tablet Take 1 tablet (40 mg total) by mouth 2 (two) times  daily. Take twice daily for 2 months then decrease to once daily     No current facility-administered medications for this visit.    VITAL SIGNS: BP (!) 106/54   Pulse 80   Temp 97.7 F (36.5 C) (Oral)   SpO2 97%  There were no vitals filed for this visit.  Estimated body mass index is 22.67 kg/m as calculated from the following:   Height as of an earlier encounter on 11/13/22: 5\' 10"  (1.778 m).   Weight as of an earlier encounter on 11/13/22: 158 lb (71.7 kg).  LABS: CBC:    Component Value Date/Time   WBC 4.5 11/13/2022 1228   WBC 4.3 11/06/2022 1110   HGB 9.4 (L) 11/13/2022 1228   HGB 11.2 (L) 10/03/2020 1317   HCT 30.1 (L) 11/13/2022 1228   HCT 33.1 (L) 10/03/2020 1317   PLT 327 11/13/2022 1228   PLT 202 10/03/2020 1317  MCV 99.0 11/13/2022 1228   MCV 89 10/03/2020 1317   MCV 91 10/11/2012 0542   NEUTROABS 2.3 11/13/2022 1228   NEUTROABS 11.6 (H) 10/11/2012 0542   LYMPHSABS 1.6 11/13/2022 1228   LYMPHSABS 0.7 (L) 10/11/2012 0542   MONOABS 0.5 11/13/2022 1228   MONOABS 0.3 10/11/2012 0542   EOSABS 0.2 11/13/2022 1228   EOSABS 0.0 10/11/2012 0542   BASOSABS 0.0 11/13/2022 1228   BASOSABS 0.0 10/11/2012 0542   Comprehensive Metabolic Panel:    Component Value Date/Time   NA 136 11/13/2022 1228   NA 135 10/03/2020 1317   NA 137 10/11/2012 0542   K 3.9 11/13/2022 1228   K 4.2 10/11/2012 0542   CL 107 11/13/2022 1228   CL 105 10/11/2012 0542   CO2 21 (L) 11/13/2022 1228   CO2 26 10/11/2012 0542   BUN 49 (H) 11/13/2022 1228   BUN 24 10/03/2020 1317   BUN 17 10/11/2012 0542   CREATININE 2.08 (H) 11/13/2022 1228   CREATININE 1.36 (H) 10/11/2012 0542   GLUCOSE 36 (LL) 11/13/2022 1228   GLUCOSE 239 (H) 10/11/2012 0542   CALCIUM 8.9 11/13/2022 1228   CALCIUM 8.7 10/11/2012 0542   AST 35 11/13/2022 1228   ALT 54 (H) 11/13/2022 1228   ALKPHOS 51 11/13/2022 1228   BILITOT 0.2 (L) 11/13/2022 1228   PROT 8.2 (H) 11/13/2022 1228   PROT 9.1 (H) 10/03/2020 1317    ALBUMIN 3.6 11/13/2022 1228   ALBUMIN 3.3 (L) 10/03/2020 1317   PERFORMANCE STATUS (ECOG) : 4 - Bedbound  Review of Systems Unless otherwise noted, a complete review of systems is negative.  Physical Exam General: Breathing appears labored. Pt in wheelchair. Able to state that he feels well. Pt appears alerted  HEENT: Pupils are equal, round and reactive to light and accomodation bilaterally  Cardiovascular: regular rate and rhythm Pulmonary: clear ant fields Abdomen: soft, non-tender Skin: no rashes, bleeding or bruising  Neurological: Generalized non-focal weakness. Grip strength 5/5 bilaterally. No facial droop.    Assessment and Plan- Patient is a 72 y.o. male    Encounter Diagnoses  Name Primary?   Hypoglycemia Yes   Altered mental status, unspecified altered mental status type    Muscle twitching     Team immediately alerted and 911 was called. Patient given soda and then some applesauce-sugar sources closest. IV line placed but no fluids were started to avoid further hypoglycemia. Vitals rechecked and monitored until EMS arrived. 02 was at 97% so no oxygen was utilized. Patient become more alert, respiratory rate improved and he regained most of his motor control by the time EMS arrived for transport to ER. Transported to ER for further monitoring and management.    Patient expressed understanding and was in agreement with this plan. He also understands that He can call clinic at any time with any questions, concerns, or complaints.   Thank you for allowing me to participate in the care of this very pleasant patient.    Visit consisted of counseling and education dealing with the complex and emotionally intense issues of symptom management in the setting of serious illness.Greater than 50%  of this time was spent counseling and coordinating care related to the above assessment and plan.  Signed by: Clent Jacks, PA-C

## 2022-11-13 NOTE — ED Notes (Signed)
Pt given sandwich tray and juice. Alert and oriented. Dr Rosalia Hammers notified.

## 2022-11-13 NOTE — Assessment & Plan Note (Signed)
Per chart review, patient has a history of type 1 diabetes diagnosed when he was in his mid 66s with recurrent episodes of DKA since. Anti-GAD elevated at 11.8 and c-peptide <0.01 when checked in 2023.   - SSI, very sensitive - Resume home [place] tomorrow if blood sugars have normalized

## 2022-11-13 NOTE — ED Notes (Signed)
Pt alert and oriented. Given OJ

## 2022-11-13 NOTE — ED Provider Notes (Signed)
[facility] Provider Note   Event Date/Time   First MD Initiated Contact with Patient 11/13/22 1346     (approximate) History  Hypoglycemia  HPI Nicholas Weiss is a 72 y.o. male who presents from the cancer center for hypoglycemia.  Patient was reportedly altered with a glucose in the 30s prior to administration by EMS with resolution of symptoms.  Patient states that he is a known diabetic and took his normal medications today but did not eat anything.  Patient states that he is hungry but denied any other complaints at this time. ROS: Patient currently denies any vision changes, tinnitus, difficulty speaking, facial droop, sore throat, chest pain, shortness of breath, abdominal pain, nausea/vomiting/diarrhea, dysuria, or weakness/numbness/paresthesias in any extremity   Physical Exam  Triage Vital Signs: ED Triage Vitals  Encounter Vitals Group     BP 11/13/22 1354 (!) 133/93     Systolic BP Percentile --      Diastolic BP Percentile --      Pulse Rate 11/13/22 1354 72     Resp 11/13/22 1354 16     Temp 11/13/22 1354 97.8 F (36.6 C)     Temp src --      SpO2 11/13/22 1354 99 %     Weight 11/13/22 1344 158 lb (71.7 kg)     Height 11/13/22 1344 5\' 10"  (1.778 m)     Head Circumference --      Peak Flow --      Pain Score 11/13/22 1344 0     Pain Loc --      Pain Education --      Exclude from Growth Chart --    Most recent vital signs: Vitals:   11/13/22 1354 11/13/22 1400  BP: (!) 133/93 126/84  Pulse: 72 74  Resp: 16   Temp: 97.8 F (36.6 C)   SpO2: 99% 100%   General: Awake, oriented x4. CV:  Good peripheral perfusion.  Resp:  Normal effort.  Abd:  No distention.  Other:  Elderly well-developed, well-nourished African-American male resting comfortably in no acute distress ED Results / Procedures / Treatments  Labs (all labs ordered are listed, but only abnormal results are displayed) Labs Reviewed  COMPREHENSIVE METABOLIC PANEL -  Abnormal; Notable for the following components:      Result Value   Glucose, Bld 53 (*)    BUN 45 (*)    Creatinine, Ser 2.06 (*)    Calcium 8.8 (*)    Total Protein 8.3 (*)    Albumin 3.3 (*)    ALT 52 (*)    GFR, Estimated 34 (*)    All other components within normal limits  CBC WITH DIFFERENTIAL/PLATELET - Abnormal; Notable for the following components:   RBC 3.07 (*)    Hemoglobin 9.4 (*)    HCT 30.7 (*)    RDW 16.6 (*)    nRBC 0.4 (*)    All other components within normal limits  CBG MONITORING, ED - Abnormal; Notable for the following components:   Glucose-Capillary 49 (*)    All other components within normal limits  CBG MONITORING, ED - Abnormal; Notable for the following components:   Glucose-Capillary 208 (*)    All other components within normal limits  CBG MONITORING, ED - Abnormal; Notable for the following components:   Glucose-Capillary 152 (*)    All other components within normal limits  LIPASE, BLOOD  URINALYSIS, ROUTINE W REFLEX MICROSCOPIC   PROCEDURES: Critical Care  performed: No .1-3 Lead EKG Interpretation  Performed by: Merwyn Katos, MD Authorized by: Merwyn Katos, MD     Interpretation: normal     ECG rate:  71   ECG rate assessment: normal     Rhythm: sinus rhythm     Ectopy: none     Conduction: normal    MEDICATIONS ORDERED IN ED: Medications  dextrose 50 % solution 50 mL (50 mLs Intravenous Given 11/13/22 1353)   IMPRESSION / MDM / ASSESSMENT AND PLAN / ED COURSE  I reviewed the triage vital signs and the nursing notes.                             The patient is on the cardiac monitor to evaluate for evidence of arrhythmia and/or significant heart rate changes. Patient's presentation is most consistent with acute presentation with potential threat to life or bodily function. Type II diabetic presents BIBA for altered mental status and hypoglycemia  Given History, Exam, and Workup I have a low suspicion for sepsis induced  hypoglycemia, long lasting medication overdose, suicidal ideation/purposeful overdose attempt.  Care of this patient will be signed out to the oncoming physician at the end of my shift.  All pertinent patient information conveyed and all questions answered.  All further care and disposition decisions will be made by the oncoming physician. Disposition: Plan discharge home with close primary care follow-up if glucose maintains   FINAL CLINICAL IMPRESSION(S) / ED DIAGNOSES   Final diagnoses:  Hypoglycemia  Transient alteration of awareness   Rx / DC Orders   ED Discharge Orders     None      Note:  This document was prepared using Dragon voice recognition software and may include unintentional dictation errors.   Merwyn Katos, MD 11/13/22 [id number]

## 2022-11-13 NOTE — Progress Notes (Signed)
Patient presented to clinic today with unstable gait, disoriented/lethargic. Falls at least x 4 on the way to clinic. Pt drove himself today to the appointment.  Patient disoriented but easily awaken. He is a diabetic but can not recall what medication that he has taken. "I've only taken 1 pill today for diabetes." H/o of alcohol/substance abuse, but patient denies any elicit / alcohol use in the last 24-48 hours.  1310 critical glucose value of 36 called by Celso Sickle - read back process performed with lab tech. Sarah, NP present at the time of this call at 1311. IV site in right [facility] started by Gladys Damme, RN  Patient provided juice/apple sauce and cola to improve hypoglycemia.   911 contacted/dispatched. Fire dept at bedside -exam room 2 at 120 pm.

## 2022-11-13 NOTE — Assessment & Plan Note (Addendum)
Renal function currently at baseline.  No further monitoring at this time.

## 2022-11-13 NOTE — ED Notes (Signed)
Pt taking off monitor repeatedly.

## 2022-11-14 DIAGNOSIS — E162 Hypoglycemia, unspecified: Secondary | ICD-10-CM | POA: Diagnosis not present

## 2022-11-14 LAB — URINE DRUG SCREEN, QUALITATIVE (ARMC ONLY)
Amphetamines, Ur Screen: NOT DETECTED
Barbiturates, Ur Screen: NOT DETECTED
Benzodiazepine, Ur Scrn: NOT DETECTED
Cannabinoid 50 Ng, Ur ~~LOC~~: POSITIVE — AB
Cocaine Metabolite,Ur ~~LOC~~: POSITIVE — AB
MDMA (Ecstasy)Ur Screen: NOT DETECTED
Methadone Scn, Ur: NOT DETECTED
Opiate, Ur Screen: NOT DETECTED
Phencyclidine (PCP) Ur S: NOT DETECTED
Tricyclic, Ur Screen: NOT DETECTED

## 2022-11-14 LAB — GLUCOSE, CAPILLARY
Glucose-Capillary: 124 mg/dL — ABNORMAL HIGH (ref 70–99)
Glucose-Capillary: 77 mg/dL (ref 70–99)
Glucose-Capillary: 113 mg/dL — ABNORMAL HIGH (ref 70–99)
Glucose-Capillary: 422 mg/dL — ABNORMAL HIGH (ref 70–99)
Glucose-Capillary: 117 mg/dL — ABNORMAL HIGH (ref 70–99)
Glucose-Capillary: 178 mg/dL — ABNORMAL HIGH (ref 70–99)

## 2022-11-14 LAB — BASIC METABOLIC PANEL
Anion gap: 4 — ABNORMAL LOW (ref 5–15)
BUN: 40 mg/dL — ABNORMAL HIGH (ref 8–23)
CO2: 24 mmol/L (ref 22–32)
Calcium: 8.4 mg/dL — ABNORMAL LOW (ref 8.9–10.3)
Chloride: 108 mmol/L (ref 98–111)
Creatinine, Ser: 1.58 mg/dL — ABNORMAL HIGH (ref 0.61–1.24)
GFR, Estimated: 47 mL/min — ABNORMAL LOW (ref >60)
Glucose, Bld: 83 mg/dL (ref 70–99)
Potassium: 4.7 mmol/L (ref 3.5–5.1)
Sodium: 136 mmol/L (ref 135–145)

## 2022-11-14 LAB — HEMOGLOBIN A1C
Hgb A1c MFr Bld: 9 % — ABNORMAL HIGH (ref 4.8–5.6)
Mean Plasma Glucose: 211.6 mg/dL

## 2022-11-14 MED ORDER — INSULIN ASPART 100 UNIT/ML IJ SOLN: 0.0000 [IU] | Freq: Every day | INTRAMUSCULAR | Status: DC

## 2022-11-14 MED ORDER — INSULIN GLARGINE-YFGN 100 UNIT/ML ~~LOC~~ SOLN
5.0000 [IU] | Freq: Every day | SUBCUTANEOUS | Status: DC
  Administered 2022-11-14 – 2022-11-15 (×2): 5 [IU] via SUBCUTANEOUS
  Filled 2022-11-14 (×2): qty 0.05

## 2022-11-14 MED ORDER — DEXTROSE 10 % IV SOLN: INTRAVENOUS | Status: DC

## 2022-11-14 MED ORDER — INSULIN ASPART 100 UNIT/ML IJ SOLN
0.0000 [IU] | Freq: Three times a day (TID) | INTRAMUSCULAR | Status: DC
  Administered 2022-11-14: 9 [IU] via SUBCUTANEOUS
  Administered 2022-11-15: 2 [IU] via SUBCUTANEOUS
  Filled 2022-11-14 (×2): qty 1

## 2022-11-14 MED ORDER — INSULIN ASPART 100 UNIT/ML IJ SOLN: 0.0000 [IU] | Freq: Three times a day (TID) | INTRAMUSCULAR | Status: DC

## 2022-11-14 NOTE — Plan of Care (Signed)
  Problem: Metabolic: Goal: Ability to maintain appropriate glucose levels will improve Outcome: Progressing   Problem: Nutritional: Goal: Maintenance of adequate nutrition will improve Outcome: Progressing   Problem: Skin Integrity: Goal: Risk for impaired skin integrity will decrease Outcome: Progressing   Problem: Education: Goal: Knowledge of General Education information will improve Description: Including pain rating scale, medication(s)/side effects and non-pharmacologic comfort measures Outcome: Progressing   Problem: Health Behavior/Discharge Planning: Goal: Ability to manage health-related needs will improve Outcome: Progressing   Problem: Clinical Measurements: Goal: Will remain free from infection Outcome: Progressing   Problem: Activity: Goal: Risk for activity intolerance will decrease Outcome: Progressing   Problem: Coping: Goal: Level of anxiety will decrease Outcome: Progressing

## 2022-11-14 NOTE — Plan of Care (Signed)
  Problem: Education: Goal: Ability to describe self-care measures that may prevent or decrease complications (Diabetes Survival Skills Education) will improve Outcome: Progressing Goal: Individualized Educational Video(s) Outcome: Progressing   Problem: Coping: Goal: Ability to adjust to condition or change in health will improve Outcome: Progressing   Problem: Fluid Volume: Goal: Ability to maintain a balanced intake and output will improve Outcome: Progressing   Problem: Health Behavior/Discharge Planning: Goal: Ability to identify and utilize available resources and services will improve Outcome: Progressing Goal: Ability to manage health-related needs will improve Outcome: Progressing   Problem: Metabolic: Goal: Ability to maintain appropriate glucose levels will improve Outcome: Progressing   Problem: Nutritional: Goal: Maintenance of adequate nutrition will improve Outcome: Progressing Goal: Progress toward achieving an optimal weight will improve Outcome: Progressing   Problem: Skin Integrity: Goal: Risk for impaired skin integrity will decrease Outcome: Progressing   Problem: Tissue Perfusion: Goal: Adequacy of tissue perfusion will improve Outcome: Progressing   Problem: Education: Goal: Knowledge of General Education information will improve Description: Including pain rating scale, medication(s)/side effects and non-pharmacologic comfort measures Outcome: Progressing   Problem: Health Behavior/Discharge Planning: Goal: Ability to manage health-related needs will improve Outcome: Progressing   Problem: Clinical Measurements: Goal: Ability to maintain clinical measurements within normal limits will improve Outcome: Progressing Goal: Will remain free from infection Outcome: Progressing Goal: Diagnostic test results will improve Outcome: Progressing Goal: Cardiovascular complication will be avoided Outcome: Progressing   Problem: Activity: Goal: Risk  for activity intolerance will decrease Outcome: Progressing   Problem: Nutrition: Goal: Adequate nutrition will be maintained Outcome: Progressing   Problem: Coping: Goal: Level of anxiety will decrease Outcome: Progressing   Problem: Elimination: Goal: Will not experience complications related to bowel motility Outcome: Progressing Goal: Will not experience complications related to urinary retention Outcome: Progressing   Problem: Pain Managment: Goal: General experience of comfort will improve Outcome: Progressing   Problem: Safety: Goal: Ability to remain free from injury will improve Outcome: Progressing   Problem: Skin Integrity: Goal: Risk for impaired skin integrity will decrease Outcome: Progressing

## 2022-11-14 NOTE — Inpatient Diabetes Management (Signed)
Inpatient Diabetes Program Recommendations  AACE/ADA: New Consensus Statement on Inpatient Glycemic Control (2015)  Target Ranges:  Prepandial:   less than 140 mg/dL      Peak postprandial:   less than 180 mg/dL (1-2 hours)      Critically ill patients:  140 - 180 mg/dL   Lab Results  Component Value Date   GLUCAP 422 (H) 11/14/2022   HGBA1C 9.0 (H) 11/14/2022    Review of Glycemic Control  Diabetes history: DM1 Outpatient Diabetes medications: Tresiba 8 every day, Novolog 0-9 units TID Current orders for Inpatient glycemic control: Semglee 5 units every day, Novolog 0-9 units TID   HgbA1C 9.0%  Inpatient Diabetes Program Recommendations:    Consult received for setting up CGM for pt  Diabetes Coordinator works remotely on weekends and will f/u on Monday, 8/19.  Please consider adding Novolog 2 units TID with meals if eating at least 50% meals  Per EMR, pt has had Dexcom G7 in the past.  Continue to follow.  Thank you. Ailene Ards, RD, LDN, CDCES Inpatient Diabetes Coordinator [phone]

## 2022-11-14 NOTE — Progress Notes (Signed)
Prior-To-Admission Oral Chemotherapy for Treatment of Oncologic Disease   Order noted from Dr. Huel Cote to continue prior-to-admission oral chemotherapy regimen of Revlimid.(Lenalidomide)  Procedure Per Pharmacy & Therapeutics Committee Policy: Orders for continuation of home oral chemotherapy for treatment of an oncologic disease will be held unless approved by an oncologist during current admission.    For patients receiving oncology care at [facility], inpatient pharmacist contacts patient's oncologist during regular office hours to review. If earlier review is medically necessary, attending physician consults [facility] on-call oncologist   For patients receiving oncology care outside of [facility], attending physician consults patient's oncologist to review. If this oncologist or their coverage cannot be reached, attending physician consults [facility] on-call oncologist   Oral chemotherapy continuation order is on hold pending oncologist review,    11/14/22:  messaged Dr. Smith Robert (oncology):   Dr. Smith Robert OK to discontinue Revlimid for now   Bari Mantis PharmD Clinical Pharmacist 11/14/2022

## 2022-11-14 NOTE — Progress Notes (Signed)
  PROGRESS NOTE    Nicholas Weiss  ZOX:[id number] DOB: 12/23/1950 DOA: 11/13/2022 PCP: Marisue Ivan, MD  110A/110A-AA  LOS: 0 days   Brief hospital course:   Assessment & Plan: Nicholas Weiss is a 72 y.o. male with medical history significant of stage III high risk IgG multiple myeloma, type 1 diabetes, hypertension, cocaine abuse, stage IIIb chronic kidney disease who presents to the ED due to hypoglycemia.    * Hypoglycemia Patient is presenting today after an episode of hypoglycemia at his oncologist office.  Despite receiving D10 50 and a full meal in the ED, patient's blood sugars continue to drop.  He noted that on day of presentation, he took 13 units of Humalog and 15 units of Tresiba.  --s/p D10 gtt overnight Plan: --d/c D10 infusion --start glargine 5u daily --SSI sensitive scale --A1c --diabetic coordinator consult  Type 1 diabetes mellitus (HCC) Per chart review, patient has a history of type 1 diabetes diagnosed when he was in his mid 64s with recurrent episodes of DKA since. Anti-GAD elevated at 11.8 and c-peptide <0.01 when checked in 2023.  --see above for management  Dizziness and falls Patient reports intermittent dizziness for 4 months, that is worse in the morning and occasionally throughout the day, generally self resolving.  Given his hypoglycemic this time, I am wondering if he has episodes of hypoglycemia throughout the day.  He would highly benefit from a continuous glucose monitor. --diabetic coordinator consult  Multiple myeloma [facility]) Has a history of stage III multiple myeloma, currently following with Dr. Smith Robert.  CKD (chronic kidney disease) stage 3a Renal function currently at baseline.    Cocaine abuse --UDS pos for cocaine and Cannabinoid    DVT prophylaxis: Lovenox SQ Code Status: Full code  Family Communication:  Level of care: Med-Surg Dispo:   The patient is from: home Anticipated d/c is to: home Anticipated d/c date is:  tomorrow   Subjective and Interval History:  Pt reported feeling better.     Objective: Vitals:   11/13/22 2050 11/14/22 0457 11/14/22 0920 11/14/22 1725  BP: (!) 179/148 118/65 (!) 150/81 119/78  Pulse: 74 65 67 75  Resp: 16 12 20 18   Temp: 98.1 F (36.7 C) 97.9 F (36.6 C) 98.4 F (36.9 C) 98 F (36.7 C)  TempSrc:    Oral  SpO2: 100% 100% 100% 100%  Weight:      Height:        Intake/Output Summary (Last 24 hours) at 11/14/2022 1835 Last data filed at 11/14/2022 1700 Gross per 24 hour  Intake 749.22 ml  Output 600 ml  Net 149.22 ml   Filed Weights   11/13/22 1344  Weight: 71.7 kg    Examination:   Constitutional: NAD, AAOx3 HEENT: conjunctivae and lids normal, EOMI CV: No cyanosis.   RESP: normal respiratory effort, on RA Neuro: II - XII grossly intact.   Psych: Normal mood and affect.     Data Reviewed: I have personally reviewed labs and imaging studies  Time spent: 50 minutes  Darlin Priestly, MD [facility] If 7PM-7AM, please contact night-coverage 11/14/2022, 6:35 PM

## 2022-11-15 DIAGNOSIS — E162 Hypoglycemia, unspecified: Secondary | ICD-10-CM | POA: Diagnosis not present

## 2022-11-15 LAB — CBC
HCT: 27 % — ABNORMAL LOW (ref 39.0–52.0)
Hemoglobin: 8.9 g/dL — ABNORMAL LOW (ref 13.0–17.0)
MCH: 30.9 pg (ref 26.0–34.0)
MCHC: 33 g/dL (ref 30.0–36.0)
MCV: 93.8 fL (ref 80.0–100.0)
Platelets: 259 10*3/uL (ref 150–400)
RBC: 2.88 MIL/uL — ABNORMAL LOW (ref 4.22–5.81)
RDW: 15.9 % — ABNORMAL HIGH (ref 11.5–15.5)
WBC: 2.7 10*3/uL — ABNORMAL LOW (ref 4.0–10.5)
nRBC: 0 % (ref 0.0–0.2)

## 2022-11-15 LAB — BASIC METABOLIC PANEL
Anion gap: 4 — ABNORMAL LOW (ref 5–15)
BUN: 34 mg/dL — ABNORMAL HIGH (ref 8–23)
CO2: 23 mmol/L (ref 22–32)
Calcium: 8.4 mg/dL — ABNORMAL LOW (ref 8.9–10.3)
Chloride: 104 mmol/L (ref 98–111)
Creatinine, Ser: 1.47 mg/dL — ABNORMAL HIGH (ref 0.61–1.24)
GFR, Estimated: 51 mL/min — ABNORMAL LOW (ref >60)
Glucose, Bld: 185 mg/dL — ABNORMAL HIGH (ref 70–99)
Potassium: 4.7 mmol/L (ref 3.5–5.1)
Sodium: 131 mmol/L — ABNORMAL LOW (ref 135–145)

## 2022-11-15 LAB — GLUCOSE, CAPILLARY
Glucose-Capillary: 163 mg/dL — ABNORMAL HIGH (ref 70–99)
Glucose-Capillary: 103 mg/dL — ABNORMAL HIGH (ref 70–99)

## 2022-11-15 LAB — MAGNESIUM: Magnesium: 1.9 mg/dL (ref 1.7–2.4)

## 2022-11-15 MED ORDER — INSULIN ASPART 100 UNIT/ML IJ SOLN
2.0000 [IU] | Freq: Three times a day (TID) | INTRAMUSCULAR | Status: DC
  Administered 2022-11-15: 2 [IU] via SUBCUTANEOUS
  Filled 2022-11-15: qty 1

## 2022-11-15 MED ORDER — TRESIBA FLEXTOUCH 100 UNIT/ML ~~LOC~~ SOPN: 5.0000 [IU] | PEN_INJECTOR | Freq: Every day | SUBCUTANEOUS | Status: DC

## 2022-11-15 MED ORDER — HYDRALAZINE HCL 20 MG/ML IJ SOLN: 10.0000 mg | Freq: Four times a day (QID) | INTRAMUSCULAR | Status: DC | PRN

## 2022-11-15 NOTE — Progress Notes (Signed)
Mobility Specialist - Progress Note   11/15/22 1018  Mobility  Activity Stood at bedside;Ambulated independently in hallway;Dangled on edge of bed  Level of Assistance Independent  Assistive Device None  Distance Ambulated (ft) 480 ft  Activity Response Tolerated well  Mobility Referral Yes  $Mobility charge 1 Mobility  Mobility Specialist Start Time (ACUTE ONLY) 1010  Mobility Specialist Stop Time (ACUTE ONLY) 1016  Mobility Specialist Time Calculation (min) (ACUTE ONLY) 6 min   Pt supine in bed on RA upon arrival. Pt completes bed mobility, STS, and ambulates 3 laps around NS indep. Pt does veer some to the right during ambulation but no LOB noted. Pt returns to bed with needs in reach.   Nicholas Weiss  Mobility Specialist  11/15/22 10:20 AM

## 2022-11-15 NOTE — Plan of Care (Signed)
  Problem: Education: Goal: Ability to describe self-care measures that may prevent or decrease complications (Diabetes Survival Skills Education) will improve Outcome: Adequate for Discharge Goal: Individualized Educational Video(s) Outcome: Adequate for Discharge   Problem: Coping: Goal: Ability to adjust to condition or change in health will improve Outcome: Adequate for Discharge   Problem: Fluid Volume: Goal: Ability to maintain a balanced intake and output will improve Outcome: Adequate for Discharge   Problem: Health Behavior/Discharge Planning: Goal: Ability to identify and utilize available resources and services will improve Outcome: Adequate for Discharge Goal: Ability to manage health-related needs will improve Outcome: Adequate for Discharge   Problem: Metabolic: Goal: Ability to maintain appropriate glucose levels will improve Outcome: Adequate for Discharge   Problem: Nutritional: Goal: Maintenance of adequate nutrition will improve Outcome: Adequate for Discharge Goal: Progress toward achieving an optimal weight will improve Outcome: Adequate for Discharge   Problem: Skin Integrity: Goal: Risk for impaired skin integrity will decrease Outcome: Adequate for Discharge   Problem: Tissue Perfusion: Goal: Adequacy of tissue perfusion will improve Outcome: Adequate for Discharge   Problem: Education: Goal: Knowledge of General Education information will improve Description: Including pain rating scale, medication(s)/side effects and non-pharmacologic comfort measures Outcome: Adequate for Discharge   Problem: Health Behavior/Discharge Planning: Goal: Ability to manage health-related needs will improve Outcome: Adequate for Discharge   Problem: Clinical Measurements: Goal: Ability to maintain clinical measurements within normal limits will improve Outcome: Adequate for Discharge Goal: Will remain free from infection Outcome: Adequate for Discharge Goal:  Diagnostic test results will improve Outcome: Adequate for Discharge Goal: Cardiovascular complication will be avoided Outcome: Adequate for Discharge   Problem: Activity: Goal: Risk for activity intolerance will decrease Outcome: Adequate for Discharge   Problem: Nutrition: Goal: Adequate nutrition will be maintained Outcome: Adequate for Discharge   Problem: Coping: Goal: Level of anxiety will decrease Outcome: Adequate for Discharge   Problem: Elimination: Goal: Will not experience complications related to bowel motility Outcome: Adequate for Discharge Goal: Will not experience complications related to urinary retention Outcome: Adequate for Discharge   Problem: Pain Managment: Goal: General experience of comfort will improve Outcome: Adequate for Discharge   Problem: Safety: Goal: Ability to remain free from injury will improve Outcome: Adequate for Discharge   Problem: Skin Integrity: Goal: Risk for impaired skin integrity will decrease Outcome: Adequate for Discharge

## 2022-11-15 NOTE — Discharge Summary (Signed)
Physician Discharge Summary   Nicholas Weiss  male DOB: 06-29-1950  BJY:[id number]  PCP: Marisue Ivan, MD  Admit date: 11/13/2022 Discharge date: 11/15/2022  Admitted From: home Disposition:  home CODE STATUS: Full code  Discharge Instructions     Diet Carb Modified   Complete by: As directed       Hospital Course:  For full details, please see H&P, progress notes, consult notes and ancillary notes.  Briefly,  Nicholas Weiss is a 72 y.o. male with medical history significant of stage III high risk IgG multiple myeloma, type 1 diabetes, hypertension, cocaine abuse, stage IIIb chronic kidney disease who presented to the ED due to hypoglycemia.    * Hypoglycemia Patient is presenting after an episode of hypoglycemia at his oncologist office.  Despite receiving D10 50 and a full meal in the ED, patient's blood sugars continued to drop.  Pt reportedly took 13 units of Humalog and 15 units of Tresiba.   Home med listed Evaristo Bury 8u daily and SSI insulin from 0-9 units based on pre-meal readings.  Pt reported he has highs and lows in BG.   --Pt received D10 infusion with resolution of hypoglycemia the next day.  Diabetic coordinator consulted to set up up with continuous glucose monitoring, however, diabetic coordinator was not available in person during weekend, and pt said he has had continuous monitors in the past and can get Rx from his outpatient provider. --pt was discharged on Tresiba 5u daily and home SSI.    Type 1 diabetes mellitus (HCC) Per chart review, patient has a history of type 1 diabetes diagnosed when he was in his mid 49s with recurrent episodes of DKA since. Anti-GAD elevated at 11.8 and c-peptide <0.01 when checked in 2023.  --see above for management   Dizziness and falls Patient reports intermittent dizziness for 4 months, that is worse in the morning and occasionally throughout the day, generally self resolving.  Given his hypoglycemic this time, possible  etiology include episodes of hypoglycemia throughout the day.  --pt advised to follow up with outpatient provider for Rx for continuous glucose monitor.   Multiple myeloma (HCC) Has a history of stage III multiple myeloma, currently following with Dr. Smith Robert.   CKD (chronic kidney disease) stage 3a Renal function currently at baseline.     Cocaine abuse --UDS pos for cocaine and Cannabinoid    Unless noted above, medications under "STOP" list are ones pt was not taking PTA.  Discharge Diagnoses:  Principal Problem:   Hypoglycemia Active Problems:   Type 1 diabetes mellitus (HCC)   Dizziness   Multiple myeloma (HCC)   CKD (chronic kidney disease) stage 3, GFR 30-59 ml/min (HCC)   30 Day Unplanned Readmission Risk Score    Flowsheet Row ED to Hosp-Admission (Discharged) from 09/18/2022 in [facility]  30 Day Unplanned Readmission Risk Score (%) 71.55 Filed at 09/20/2022 1201       This score is the patient's risk of an unplanned readmission within 30 days of being discharged (0 -100%). The score is based on dignosis, age, lab data, medications, orders, and past utilization.   Low:  0-14.9   Medium: 15-21.9   High: 22-29.9   Extreme: 30 and above         Discharge Instructions:  Allergies as of 11/15/2022       Reactions   Penicillins Other (See Comments)   Childhood allergy -tolerated amoxil 03/2022 Unknown reaction Has patient had a PCN reaction  causing immediate rash, facial/tongue/throat swelling, SOB or lightheadedness with hypotension: No Has patient had a PCN reaction causing severe rash involving mucus membranes or skin necrosis: No Has patient had a PCN reaction that required hospitalization No Has patient had a PCN reaction occurring within the last 10 years: No If all of the above answers are "NO", then may proceed with Cephalosporin use.        Medication List     STOP taking these medications    feeding supplement (GLUCERNA  SHAKE) Liqd       TAKE these medications    acetaminophen 500 MG tablet Commonly known as: TYLENOL Take 1,000 mg by mouth daily as needed for mild pain or moderate pain.   acyclovir 400 MG tablet Commonly known as: ZOVIRAX Take 1 tablet (400 mg total) by mouth 2 (two) times daily.   ascorbic acid 500 MG tablet Commonly known as: VITAMIN C Take 1 tablet (500 mg total) by mouth daily.   aspirin EC 81 MG tablet Take 81 mg by mouth in the morning.   atorvastatin 80 MG tablet Commonly known as: LIPITOR Take 1 tablet (80 mg total) by mouth at bedtime. Hold while taking Paxlovid   Baclofen 5 MG Tabs Take 5 mg by mouth daily as needed (muscle spasm).   Baqsimi One Pack 3 MG/DOSE Powd Generic drug: Glucagon Place 3 mg into the nose once as needed for up to 1 dose (for low sugar).   docusate sodium 100 MG capsule Commonly known as: COLACE Take 100 mg by mouth daily as needed for mild constipation.   lenalidomide 10 MG capsule Commonly known as: REVLIMID Take 1 capsule (10 mg total) by mouth daily. Take for 14 days, then hold for 7 days. Repeat every 21 days.   multivitamin with minerals Tabs tablet Take 1 tablet by mouth daily.   NovoLOG 100 UNIT/ML injection Generic drug: insulin aspart Sliding Scale: Glucose 70 - 120: 0 units  Glucose 121 - 150: 1 unit  Glucose 151 - 200: 2 units Glucose 201 - 250: 3 units  Glucose 251 - 300: 5 units  Glucose 301 - 350: 7 units  Glucose 351 - 400: 9 units  Glucose > 400: call your docotr   ondansetron 8 MG tablet Commonly known as: ZOFRAN Take 8 mg by mouth daily as needed for nausea or vomiting.   pantoprazole 40 MG tablet Commonly known as: Protonix Take 1 tablet (40 mg total) by mouth 2 (two) times daily. Take twice daily for 2 months then decrease to once daily   Tresiba FlexTouch 100 UNIT/ML FlexTouch Pen Generic drug: insulin degludec Inject 5 Units into the skin daily. Home med. Start taking on: November 16, 2022 What  changed:  how much to take additional instructions         Follow-up Information     Marisue Ivan, MD Follow up in 1 week(s).   Specialty: Family Medicine Contact information: [address] [facility] [place] [address] [phone]                 Allergies  Allergen Reactions   Penicillins Other (See Comments)    Childhood allergy -tolerated amoxil 03/2022 Unknown reaction Has patient had a PCN reaction causing immediate rash, facial/tongue/throat swelling, SOB or lightheadedness with hypotension: No Has patient had a PCN reaction causing severe rash involving mucus membranes or skin necrosis: No Has patient had a PCN reaction that required hospitalization No Has patient had a PCN reaction  occurring within the last 10 years: No If all of the above answers are "NO", then may proceed with Cephalosporin use.      The results of significant diagnostics from this hospitalization (including imaging, microbiology, ancillary and laboratory) are listed below for reference.   Consultations:   Procedures/Studies: CT Head Wo Contrast  Result Date: 10/31/2022 CLINICAL DATA:  Mental status change, unknown cause EXAM: CT HEAD WITHOUT CONTRAST TECHNIQUE: Contiguous axial images were obtained from the base of the skull through the vertex without intravenous contrast. RADIATION DOSE REDUCTION: This exam was performed according to the departmental dose-optimization program which includes automated exposure control, adjustment of the mA and/or kV according to patient size and/or use of iterative reconstruction technique. COMPARISON:  Head CT 10/17/2022, brain MRI 10/18/2022 FINDINGS: Brain: No intracranial hemorrhage, mass effect, or midline shift. No hydrocephalus. The basilar cisterns are patent. Stable chronic small vessel ischemia. No evidence of territorial infarct or acute ischemia. Chronic dural calcifications. No extra-axial or intracranial fluid  collection. Vascular: Atherosclerosis of skullbase vasculature without hyperdense vessel or abnormal calcification. Skull: No fracture or focal lesion. Sinuses/Orbits: Paranasal sinuses and mastoid air cells are clear. The visualized orbits are unremarkable. Other: None. IMPRESSION: 1. No acute intracranial abnormality. 2. Stable chronic small vessel ischemia. Electronically Signed   By: Narda Rutherford M.D.   On: 10/31/2022 16:25   MR BRAIN WO CONTRAST  Result Date: 10/18/2022 CLINICAL DATA:  Neuro deficit with acute stroke suspected weakness. Multiple falls. EXAM: MRI HEAD WITHOUT CONTRAST TECHNIQUE: Multiplanar, multiecho pulse sequences of the brain and surrounding structures were obtained without intravenous contrast. COMPARISON:  Head CT from yesterday.  Brain MRI 03/10/2022 FINDINGS: Brain: No acute infarction, hemorrhage, hydrocephalus, extra-axial collection or mass lesion. Small foci of mineralization along the lateral ventricles which are stable from 2023 and from uncertain remote insult. Brain volume is normal and there is minor chronic small vessel ischemia. Vascular: Normal flow voids. Skull and upper cervical spine: Normal marrow signal. Sinuses/Orbits: Negative. IMPRESSION: No acute or reversible finding. Stable brain MRI when compared to 2023. Electronically Signed   By: Tiburcio Pea M.D.   On: 10/18/2022 04:48   CT HEAD WO CONTRAST  Result Date: 10/17/2022 CLINICAL DATA:  Neuro deficit, acute, stroke suspected EXAM: CT HEAD WITHOUT CONTRAST TECHNIQUE: Contiguous axial images were obtained from the base of the skull through the vertex without intravenous contrast. RADIATION DOSE REDUCTION: This exam was performed according to the departmental dose-optimization program which includes automated exposure control, adjustment of the mA and/or kV according to patient size and/or use of iterative reconstruction technique. COMPARISON:  09/18/2022 FINDINGS: Brain: Extensive calcifications along  the falx and tentorium, unchanged. No acute intracranial abnormality. Specifically, no hemorrhage, hydrocephalus, mass lesion, acute infarction, or significant intracranial injury. Vascular: No hyperdense vessel or unexpected calcification. Skull: No acute calvarial abnormality. Sinuses/Orbits: No acute findings Other: None IMPRESSION: No acute intracranial abnormality. Electronically Signed   By: Charlett Nose M.D.   On: 10/17/2022 19:09      Labs: BNP (last 3 results) No results for input(s): "BNP" in the last 8760 hours. Basic Metabolic Panel: Recent Labs  Lab 11/13/22 1228 11/13/22 1350 11/14/22 0429 11/15/22 0519  NA 136 137 136 131*  K 3.9 4.0 4.7 4.7  CL 107 107 108 104  CO2 21* 23 24 23   GLUCOSE 36* 53* 83 185*  BUN 49* 45* 40* 34*  CREATININE 2.08* 2.06* 1.58* 1.47*  CALCIUM 8.9 8.8* 8.4* 8.4*  MG  --   --   --  1.9   Liver Function Tests: Recent Labs  Lab 11/13/22 1228 11/13/22 1350  AST 35 35  ALT 54* 52*  ALKPHOS 51 43  BILITOT 0.2* 0.5  PROT 8.2* 8.3*  ALBUMIN 3.6 3.3*   Recent Labs  Lab 11/13/22 1350  LIPASE 32   No results for input(s): "AMMONIA" in the last 168 hours. CBC: Recent Labs  Lab 11/13/22 1228 11/13/22 1350 11/15/22 0519  WBC 4.5 5.2 2.7*  NEUTROABS 2.3 2.6  --   HGB 9.4* 9.4* 8.9*  HCT 30.1* 30.7* 27.0*  MCV 99.0 100.0 93.8  PLT 327 287 259   Cardiac Enzymes: No results for input(s): "CKTOTAL", "CKMB", "CKMBINDEX", "TROPONINI" in the last 168 hours. BNP: Invalid input(s): "POCBNP" CBG: Recent Labs  Lab 11/14/22 1350 11/14/22 1723 11/14/22 2052 11/15/22 0745 11/15/22 1132  GLUCAP 422* 117* 178* 163* 103*   D-Dimer No results for input(s): "DDIMER" in the last 72 hours. Hgb A1c Recent Labs    11/14/22 0428  HGBA1C 9.0*   Lipid Profile No results for input(s): "CHOL", "HDL", "LDLCALC", "TRIG", "CHOLHDL", "LDLDIRECT" in the last 72 hours. Thyroid function studies No results for input(s): "TSH", "T4TOTAL", "T3FREE",  "THYROIDAB" in the last 72 hours.  Invalid input(s): "FREET3" Anemia work up No results for input(s): "VITAMINB12", "FOLATE", "FERRITIN", "TIBC", "IRON", "RETICCTPCT" in the last 72 hours. Urinalysis    Component Value Date/Time   COLORURINE YELLOW (A) 10/17/2022 2325   APPEARANCEUR CLEAR (A) 10/17/2022 2325   LABSPEC 1.016 10/17/2022 2325   PHURINE 5.0 10/17/2022 2325   GLUCOSEU >=500 (A) 10/17/2022 2325   HGBUR NEGATIVE 10/17/2022 2325   BILIRUBINUR NEGATIVE 10/17/2022 2325   KETONESUR NEGATIVE 10/17/2022 2325   PROTEINUR NEGATIVE 10/17/2022 2325   NITRITE NEGATIVE 10/17/2022 2325   LEUKOCYTESUR NEGATIVE 10/17/2022 2325   Sepsis Labs Recent Labs  Lab 11/13/22 1228 11/13/22 1350 11/15/22 0519  WBC 4.5 5.2 2.7*   Microbiology No results found for this or any previous visit (from the past 240 hour(s)).   Total time spend on discharging this patient, including the last patient exam, discussing the hospital stay, instructions for ongoing care as it relates to all pertinent caregivers, as well as preparing the medical discharge records, prescriptions, and/or referrals as applicable, is 40 minutes.    Darlin Priestly, MD  [facility] 11/15/2022, 12:10 PM

## 2022-11-15 NOTE — Plan of Care (Signed)
  Problem: Coping: Goal: Ability to adjust to condition or change in health will improve Outcome: Progressing   Problem: Health Behavior/Discharge Planning: Goal: Ability to identify and utilize available resources and services will improve Outcome: Progressing Goal: Ability to manage health-related needs will improve Outcome: Progressing   Problem: Metabolic: Goal: Ability to maintain appropriate glucose levels will improve Outcome: Progressing   Problem: Nutritional: Goal: Maintenance of adequate nutrition will improve Outcome: Progressing   Problem: Education: Goal: Knowledge of General Education information will improve Description: Including pain rating scale, medication(s)/side effects and non-pharmacologic comfort measures Outcome: Progressing   Problem: Clinical Measurements: Goal: Will remain free from infection Outcome: Progressing Goal: Diagnostic test results will improve Outcome: Progressing

## 2022-11-16 ENCOUNTER — Encounter: Payer: Self-pay | Admitting: Oncology

## 2022-11-18 ENCOUNTER — Other Ambulatory Visit: Payer: Self-pay | Admitting: Oncology

## 2022-11-18 ENCOUNTER — Other Ambulatory Visit: Payer: Self-pay | Admitting: Podiatry

## 2022-11-18 ENCOUNTER — Telehealth: Payer: Self-pay | Admitting: Pharmacist

## 2022-11-18 DIAGNOSIS — C9000 Multiple myeloma not having achieved remission: Secondary | ICD-10-CM

## 2022-11-18 DIAGNOSIS — M869 Osteomyelitis, unspecified: Secondary | ICD-10-CM

## 2022-11-18 DIAGNOSIS — Z9889 Other specified postprocedural states: Secondary | ICD-10-CM

## 2022-11-18 MED ORDER — POMALIDOMIDE 3 MG PO CAPS
3.0000 mg | ORAL_CAPSULE | Freq: Every day | ORAL | 0 refills | Status: DC
Start: 2022-11-18 — End: 2022-12-16

## 2022-11-18 NOTE — Telephone Encounter (Signed)
Clinical Pharmacist Practitioner Encounter   Received new prescription for Pomalyst (pomalidomide) for the treatment of IgG lambda multiple myeloma in conjunction with daratumumab, bortezomib, and dexamethasone, planned duration until disease control or unacceptable drug toxicity.  CMP from 11/13/22 assessed, no relevant lab abnormalities. Prescription dose and frequency assessed.   Current medication list in Epic reviewed, no relevant DDIs with pomalidomide identified.   Evaluated chart and no patient barriers to medication adherence identified.   Prescription will be sent to [facility].  [facility] Clinic will continue to follow for insurance authorization, copayment issues, initial counseling and start date.   Remi Haggard, PharmD, BCPS, BCOP, CPP Hematology/Oncology Clinical Pharmacist Practitioner [facility] [phone]  11/18/2022 2:18 PM

## 2022-11-18 NOTE — Telephone Encounter (Signed)
Pomalyst Rx sent to [facility]. Attempted to call patient to let him know, had to LVM.

## 2022-11-19 ENCOUNTER — Encounter: Payer: Self-pay | Admitting: Oncology

## 2022-11-20 ENCOUNTER — Inpatient Hospital Stay: Payer: 59

## 2022-11-20 ENCOUNTER — Encounter: Payer: Self-pay | Admitting: Oncology

## 2022-11-20 ENCOUNTER — Ambulatory Visit: Payer: 59 | Admitting: Oncology

## 2022-11-20 VITALS — BP 184/94 | HR 71 | Temp 95.8°F | Resp 17 | Wt 157.1 lb

## 2022-11-20 DIAGNOSIS — Z5112 Encounter for antineoplastic immunotherapy: Secondary | ICD-10-CM | POA: Diagnosis not present

## 2022-11-20 DIAGNOSIS — C9000 Multiple myeloma not having achieved remission: Secondary | ICD-10-CM

## 2022-11-20 LAB — CBC WITH DIFFERENTIAL/PLATELET
Abs Immature Granulocytes: 0.05 10*3/uL (ref 0.00–0.07)
Basophils Absolute: 0 10*3/uL (ref 0.0–0.1)
Basophils Relative: 0 %
Eosinophils Absolute: 0.2 10*3/uL (ref 0.0–0.5)
Eosinophils Relative: 4 %
HCT: 27.8 % — ABNORMAL LOW (ref 39.0–52.0)
Hemoglobin: 8.9 g/dL — ABNORMAL LOW (ref 13.0–17.0)
Immature Granulocytes: 1 %
Lymphocytes Relative: 26 %
Lymphs Abs: 1.2 10*3/uL (ref 0.7–4.0)
MCH: 31.2 pg (ref 26.0–34.0)
MCHC: 32 g/dL (ref 30.0–36.0)
MCV: 97.5 fL (ref 80.0–100.0)
Monocytes Absolute: 0.4 10*3/uL (ref 0.1–1.0)
Monocytes Relative: 9 %
Neutro Abs: 2.6 10*3/uL (ref 1.7–7.7)
Neutrophils Relative %: 60 %
Platelets: 299 10*3/uL (ref 150–400)
RBC: 2.85 MIL/uL — ABNORMAL LOW (ref 4.22–5.81)
RDW: 15.9 % — ABNORMAL HIGH (ref 11.5–15.5)
WBC: 4.4 10*3/uL (ref 4.0–10.5)
nRBC: 0 % (ref 0.0–0.2)

## 2022-11-20 LAB — COMPREHENSIVE METABOLIC PANEL
ALT: 33 U/L (ref 0–44)
AST: 24 U/L (ref 15–41)
Albumin: 3.4 g/dL — ABNORMAL LOW (ref 3.5–5.0)
Alkaline Phosphatase: 50 U/L (ref 38–126)
Anion gap: 8 (ref 5–15)
BUN: 48 mg/dL — ABNORMAL HIGH (ref 8–23)
CO2: 23 mmol/L (ref 22–32)
Calcium: 8.2 mg/dL — ABNORMAL LOW (ref 8.9–10.3)
Chloride: 104 mmol/L (ref 98–111)
Creatinine, Ser: 1.87 mg/dL — ABNORMAL HIGH (ref 0.61–1.24)
GFR, Estimated: 38 mL/min — ABNORMAL LOW (ref >60)
Glucose, Bld: 221 mg/dL — ABNORMAL HIGH (ref 70–99)
Potassium: 5.1 mmol/L (ref 3.5–5.1)
Sodium: 135 mmol/L (ref 135–145)
Total Bilirubin: 0.3 mg/dL (ref 0.3–1.2)
Total Protein: 7.6 g/dL (ref 6.5–8.1)

## 2022-11-20 MED ORDER — DIPHENHYDRAMINE HCL 25 MG PO CAPS
50.0000 mg | ORAL_CAPSULE | Freq: Once | ORAL | Status: AC
  Administered 2022-11-20: 50 mg via ORAL
  Filled 2022-11-20: qty 2

## 2022-11-20 MED ORDER — DARATUMUMAB-HYALURONIDASE-FIHJ 1800-30000 MG-UT/15ML ~~LOC~~ SOLN
1800.0000 mg | Freq: Once | SUBCUTANEOUS | Status: AC
  Administered 2022-11-20: 1800 mg via SUBCUTANEOUS
  Filled 2022-11-20: qty 15

## 2022-11-20 MED ORDER — ACETAMINOPHEN 325 MG PO TABS
650.0000 mg | ORAL_TABLET | Freq: Once | ORAL | Status: AC
  Administered 2022-11-20: 650 mg via ORAL
  Filled 2022-11-20: qty 2

## 2022-11-20 MED ORDER — DEXAMETHASONE 4 MG PO TABS
10.0000 mg | ORAL_TABLET | Freq: Once | ORAL | Status: AC
  Administered 2022-11-20: 10 mg via ORAL
  Filled 2022-11-20: qty 3

## 2022-11-20 NOTE — Telephone Encounter (Signed)
Met patient in clinic and provided him with the direct phone number to [facility] to schedule delivery of Pomalyst. Medication calendar was also provided to patient. Patient knows to start Pomalyst on 11/27/22 and will call if he has any issues receiving medication from [facility].    Ardeen Fillers, CPhT Oncology Pharmacy Patient Advocate  [facility]  [phone] (phone) [phone] (fax) 11/20/2022 11:54 AM

## 2022-11-20 NOTE — Progress Notes (Signed)
Following darzalex, pt bp 188/94. Pt denies headache, blurry vision, or any other symptoms of hypertension. Smith Robert, MD notified.BP rechecked and still SBP>180. Smith Robert, MD notified and ordered pt to discuss with his PCP [place]. Pt educated to follow up with PCP about high blood pressure and if he becomes symptomatic to contact emergency services or go to the emergency room. Pt verified understanding and said he would either call or go see his PCP today about scheduling an appointment in regards to blood pressure. Pt stable at discharge.

## 2022-11-20 NOTE — Telephone Encounter (Signed)
Thanks Ben

## 2022-11-20 NOTE — Telephone Encounter (Signed)
Called [facility] to check status of medication. Informed by representative that Pomalyst was processed successfully with a $0.00 co-pay and that they attempted to reach patient yesterday, 11/19/22, and would reach out again today, 11/20/22. I informed them I would let patient know as I am seeing them in clinic this morning. I was given direct number to [facility] [phone]) and will provide that to patient when I see them.    Ardeen Fillers, CPhT Oncology Pharmacy Patient Advocate  [facility]  [phone] (phone) [phone] (fax) 11/20/2022 9:19 AM

## 2022-11-23 LAB — KAPPA/LAMBDA LIGHT CHAINS
Kappa free light chain: 9.8 mg/L (ref 3.3–19.4)
Kappa, lambda light chain ratio: 0.06 — ABNORMAL LOW (ref 0.26–1.65)
Lambda free light chains: 173.7 mg/L — ABNORMAL HIGH (ref 5.7–26.3)

## 2022-11-24 NOTE — Telephone Encounter (Signed)
Called patient and reminded him to call [facility] to set up delivery. He said he would call when he hung up with me, provided patient with phone number.

## 2022-11-25 ENCOUNTER — Other Ambulatory Visit: Payer: Self-pay | Admitting: Oncology

## 2022-11-25 ENCOUNTER — Other Ambulatory Visit: Payer: Self-pay

## 2022-11-25 ENCOUNTER — Telehealth: Payer: Self-pay | Admitting: *Deleted

## 2022-11-25 NOTE — Telephone Encounter (Signed)
On 8/30

## 2022-11-25 NOTE — Telephone Encounter (Signed)
Per [facility] portal, Pomalyst was dispensed on 11/24/22

## 2022-11-25 NOTE — Telephone Encounter (Signed)
Call returned to patient and advise that he is to start the [facility] Friday 8/30 Same day that he comes in to see doctor. He repeated this back to me

## 2022-11-25 NOTE — Telephone Encounter (Signed)
Patient called reporting that he got the medicine in the mail today and is asking when he is to start taking it. Please advise

## 2022-11-26 LAB — MULTIPLE MYELOMA PANEL, SERUM
Albumin SerPl Elph-Mcnc: 3.5 g/dL (ref 2.9–4.4)
Albumin/Glob SerPl: 0.9 (ref 0.7–1.7)
Alpha 1: 0.2 g/dL (ref 0.0–0.4)
Alpha2 Glob SerPl Elph-Mcnc: 0.6 g/dL (ref 0.4–1.0)
B-Globulin SerPl Elph-Mcnc: 0.7 g/dL (ref 0.7–1.3)
Gamma Glob SerPl Elph-Mcnc: 2.4 g/dL — ABNORMAL HIGH (ref 0.4–1.8)
Globulin, Total: 3.9 g/dL (ref 2.2–3.9)
IgA: 14 mg/dL — ABNORMAL LOW (ref 61–437)
IgG (Immunoglobin G), Serum: 2881 mg/dL — ABNORMAL HIGH (ref 603–1613)
IgM (Immunoglobulin M), Srm: <5 mg/dL — ABNORMAL LOW (ref 20–172)
M Protein SerPl Elph-Mcnc: 2.1 g/dL — ABNORMAL HIGH
Total Protein ELP: 7.4 g/dL (ref 6.0–8.5)

## 2022-11-27 ENCOUNTER — Inpatient Hospital Stay: Payer: 59

## 2022-11-27 ENCOUNTER — Encounter: Payer: Self-pay | Admitting: Oncology

## 2022-11-27 ENCOUNTER — Inpatient Hospital Stay (HOSPITAL_BASED_OUTPATIENT_CLINIC_OR_DEPARTMENT_OTHER): Payer: 59 | Admitting: Oncology

## 2022-11-27 VITALS — BP 136/79 | HR 78 | Temp 97.6°F | Resp 18 | Ht 70.0 in | Wt 155.0 lb

## 2022-11-27 DIAGNOSIS — Z79899 Other long term (current) drug therapy: Secondary | ICD-10-CM | POA: Diagnosis not present

## 2022-11-27 DIAGNOSIS — Z5111 Encounter for antineoplastic chemotherapy: Secondary | ICD-10-CM

## 2022-11-27 DIAGNOSIS — C9000 Multiple myeloma not having achieved remission: Secondary | ICD-10-CM | POA: Diagnosis not present

## 2022-11-27 DIAGNOSIS — D649 Anemia, unspecified: Secondary | ICD-10-CM

## 2022-11-27 DIAGNOSIS — N183 Chronic kidney disease, stage 3 unspecified: Secondary | ICD-10-CM

## 2022-11-27 DIAGNOSIS — Z5112 Encounter for antineoplastic immunotherapy: Secondary | ICD-10-CM | POA: Diagnosis not present

## 2022-11-27 LAB — CBC WITH DIFFERENTIAL/PLATELET
Abs Immature Granulocytes: 0 10*3/uL (ref 0.00–0.07)
Basophils Absolute: 0 10*3/uL (ref 0.0–0.1)
Basophils Relative: 1 %
Eosinophils Absolute: 0.1 10*3/uL (ref 0.0–0.5)
Eosinophils Relative: 3 %
HCT: 27.5 % — ABNORMAL LOW (ref 39.0–52.0)
Hemoglobin: 8.8 g/dL — ABNORMAL LOW (ref 13.0–17.0)
Immature Granulocytes: 0 %
Lymphocytes Relative: 42 %
Lymphs Abs: 1.3 10*3/uL (ref 0.7–4.0)
MCH: 30.8 pg (ref 26.0–34.0)
MCHC: 32 g/dL (ref 30.0–36.0)
MCV: 96.2 fL (ref 80.0–100.0)
Monocytes Absolute: 0.4 10*3/uL (ref 0.1–1.0)
Monocytes Relative: 12 %
Neutro Abs: 1.3 10*3/uL — ABNORMAL LOW (ref 1.7–7.7)
Neutrophils Relative %: 42 %
Platelets: 203 10*3/uL (ref 150–400)
RBC: 2.86 MIL/uL — ABNORMAL LOW (ref 4.22–5.81)
RDW: 15.1 % (ref 11.5–15.5)
WBC: 3 10*3/uL — ABNORMAL LOW (ref 4.0–10.5)
nRBC: 0 % (ref 0.0–0.2)

## 2022-11-27 MED ORDER — SODIUM CHLORIDE 0.9% FLUSH
3.0000 mL | Freq: Once | INTRAVENOUS | Status: DC | PRN
  Filled 2022-11-27: qty 3

## 2022-11-27 MED ORDER — SODIUM CHLORIDE 0.9% FLUSH
10.0000 mL | Freq: Once | INTRAVENOUS | Status: DC | PRN
  Filled 2022-11-27: qty 10

## 2022-11-27 MED ORDER — ALTEPLASE 2 MG IJ SOLR
2.0000 mg | Freq: Once | INTRAMUSCULAR | Status: DC | PRN
  Filled 2022-11-27: qty 2

## 2022-11-27 MED ORDER — DIPHENHYDRAMINE HCL 25 MG PO CAPS
50.0000 mg | ORAL_CAPSULE | Freq: Once | ORAL | Status: AC
  Administered 2022-11-27: 50 mg via ORAL
  Filled 2022-11-27: qty 2

## 2022-11-27 MED ORDER — DARATUMUMAB-HYALURONIDASE-FIHJ 1800-30000 MG-UT/15ML ~~LOC~~ SOLN
1800.0000 mg | Freq: Once | SUBCUTANEOUS | Status: AC
  Administered 2022-11-27: 1800 mg via SUBCUTANEOUS
  Filled 2022-11-27: qty 15

## 2022-11-27 MED ORDER — EPOETIN ALFA-EPBX 40000 UNIT/ML IJ SOLN
40000.0000 [IU] | INTRAMUSCULAR | Status: DC
  Administered 2022-11-27: 40000 [IU] via SUBCUTANEOUS
  Filled 2022-11-27: qty 1

## 2022-11-27 MED ORDER — ACETAMINOPHEN 325 MG PO TABS
650.0000 mg | ORAL_TABLET | Freq: Once | ORAL | Status: AC
  Administered 2022-11-27: 650 mg via ORAL
  Filled 2022-11-27: qty 2

## 2022-11-27 MED ORDER — DEXAMETHASONE 4 MG PO TABS
10.0000 mg | ORAL_TABLET | Freq: Once | ORAL | Status: AC
  Administered 2022-11-27: 10 mg via ORAL
  Filled 2022-11-27: qty 3

## 2022-11-27 NOTE — Progress Notes (Signed)
Hematology/Oncology Consult note [facility]  [phone] [phone]  Patient Care Team: Marisue Ivan, MD as PCP - General (Family Medicine) [place], [facility] (Optometry) Creig Hines, MD as Consulting Physician (Oncology)   Name of the patient: Nicholas Weiss  [id number]  Mar 16, 1951   Date of visit: 11/27/22  Diagnosis-  high risk IgG lambda multiple myeloma R-ISS stage III    Chief complaint/ Reason for visit-on treatment assessment prior to next cycle of Darzalex  Heme/Onc history: Patient is a 72 year old African-American male with a past medical history significant for uncontrolled type 1 diabetes, chronic kidney disease stage III chronic normocytic anemia referred for abnormal SPEP. Patient's creatinine has been fluctuating between 1.5-2 but about 5 months ago it went up all the way to 5 and his blood sugars were in the 1000 range. More recently his kidney numbers have been drifting back to normal values. As a part of the work-up he had serum protein electrophoresis done which showed an elevated gammaglobulin fraction with an M spike of 3%. The amount of M spike was not quantified in the specimen. He has therefore been referred to [place] for further management. Patient endorses chronic fatigue reports that his appetite and weight have remained stable   Results of blood work from 04/08/2020 were as follows: CBC showed white count of 2.9, H&H of 10.5/31.2 with an MCV of 91 and a platelet count of 191.  CMP showed a mildly elevated creatinine of 1.2 which was better as compared to 5 months ago when it was 3.9.  Total protein was mildly elevated at 9.1 calcium normal at 8.4 ferritin and iron studies B12 folate TSH and haptoglobin were normal.  Myeloma panel revealed an elevated IgG level of 06/04/2004 with an M protein of 3.1 g.  Immunofixation showed IgG lambda specificity.  Serum free light chain ratio was elevated at 25 and free light  chain lambda elevated at 370   Further myeloma work-up including a PET CT scan did not reveal any evidence of edematous lesions.  Bone marrow biopsy showed 23% plasma cells by manual count and 30% by CD138 IHC.  Normal cytogenetics.  FISH studies for myeloma showed gain of 1 q.  13 q-. detected.  P53 not detected.   He was treated for a year and has having smoldering multiple myeloma since both CKD and anemia have been stable for 3 to 4 years and could be secondary to uncontrolled diabetes.In June 2023 IgG levels increased to 5463 with an M protein of 4.1 g as compared to 2.6 g in September 2022.  Serum free light chain ratio remains around 25.  Therefore a repeat bone marrow biopsy was done which shows further increase in plasma cells ranging from 30 to 70% and by immunohistochemistry 60 to 70% of the cells were positive for CD138.  Cytogenetics normal.  FISH study showed 4: 14 translocation and gain of 1 q. making this high risk RISS stage III.  Repeat PET scan showed no lytic lesions   Patient is not a transplant candidate and was started on Velcade Revlimid dexamethasone regimen in July 2023.  He was also referred to [facility] for second opinion but did not go.  Treatment complicated by repeated hospitalizations for uncontrolled blood sugars.  He has been frequently hospitalized thereby causing multiple interruptions in his treatment. Due to lack of adequate response to Vrd darzalex was added in may 2024. M protein remained around 2 g despite adding Darzalex to  the regimen and therefore plan to switch to Pomalyst plus Darzalex and stop Velcade and Revlimid altogether starting August 2024    Interval history-patient started taking his Pomalyst today.  He had another admission in the hospital for hypoglycemia.  Presently reports doing well  ECOG PS- 2 Pain scale- 0  Review of systems- Review of Systems  Constitutional:  Positive for malaise/fatigue. Negative for chills, fever and weight loss.  HENT:   Negative for congestion, ear discharge and nosebleeds.   Eyes:  Negative for blurred vision.  Respiratory:  Negative for cough, hemoptysis, sputum production, shortness of breath and wheezing.   Cardiovascular:  Negative for chest pain, palpitations, orthopnea and claudication.  Gastrointestinal:  Negative for abdominal pain, blood in stool, constipation, diarrhea, heartburn, melena, nausea and vomiting.  Genitourinary:  Negative for dysuria, flank pain, frequency, hematuria and urgency.  Musculoskeletal:  Negative for back pain, joint pain and myalgias.  Skin:  Negative for rash.  Neurological:  Negative for dizziness, tingling, focal weakness, seizures, weakness and headaches.  Endo/Heme/Allergies:  Does not bruise/bleed easily.  Psychiatric/Behavioral:  Negative for depression and suicidal ideas. The patient does not have insomnia.       Allergies  Allergen Reactions   Penicillins Other (See Comments)    Childhood allergy -tolerated amoxil 03/2022 Unknown reaction Has patient had a PCN reaction causing immediate rash, facial/tongue/throat swelling, SOB or lightheadedness with hypotension: No Has patient had a PCN reaction causing severe rash involving mucus membranes or skin necrosis: No Has patient had a PCN reaction that required hospitalization No Has patient had a PCN reaction occurring within the last 10 years: No If all of the above answers are "NO", then may proceed with Cephalosporin use.      Past Medical History:  Diagnosis Date   Acute metabolic encephalopathy 12/02/2020   DKA (diabetic ketoacidoses) 04/06/2016   Hypercholesteremia    Hypertension      Past Surgical History:  Procedure Laterality Date   AMPUTATION TOE Left 04/25/2022   Procedure: LEFT GREAT TOE AMPUTATION AND LEFT PARTIAL 2ND TOE AMPUTATION;  Surgeon: Felecia Shelling, DPM;  Location: [facility] ORS;  Service: Podiatry;  Laterality: Left;   COLONOSCOPY WITH PROPOFOL N/A 02/01/2020   Procedure: COLONOSCOPY  WITH PROPOFOL;  Surgeon: Toney Reil, MD;  Location: [facility] ENDOSCOPY;  Service: Gastroenterology;  Laterality: N/A;   ESOPHAGOGASTRODUODENOSCOPY  02/01/2020   Procedure: ESOPHAGOGASTRODUODENOSCOPY (EGD);  Surgeon: Toney Reil, MD;  Location: [facility] ENDOSCOPY;  Service: Gastroenterology;;   ESOPHAGOGASTRODUODENOSCOPY (EGD) WITH PROPOFOL N/A 06/07/2022   Procedure: ESOPHAGOGASTRODUODENOSCOPY (EGD) WITH PROPOFOL;  Surgeon: Kerin Salen, MD;  Location: [facility] ENDOSCOPY;  Service: Gastroenterology;  Laterality: N/A;   KNEE SURGERY Right    Torn meniscus   KNEE SURGERY Left     Social History   Socioeconomic History   Marital status: Single    Spouse name: Not on file   Number of children: 3   Years of education: Not on file   Highest education level: High school graduate  Occupational History    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  Tobacco Use   Smoking status: Never   Smokeless tobacco: Never  Vaping Use   Vaping status: Never Used  Substance and Sexual Activity   Alcohol use: Not Currently    Alcohol/week: 0.0 - 1.0 standard drinks of alcohol    Comment: "once every 2 months"   Drug use: Yes    Types: Marijuana, "Crack" cocaine    Comment: last week   Sexual activity: Not  Currently    Birth control/protection: None  Other Topics Concern   Not on file  Social History Narrative   Lives with girlfriend "sharon"   Social Determinants of Health   Financial Resource Strain: Low Risk  (08/05/2021)   Received from [facility]   Overall Financial Resource Strain (CARDIA)    Difficulty of Paying Living Expenses: Not hard at all  Food Insecurity: No Food Insecurity (11/13/2022)   Hunger Vital Sign    Worried About Running Out of Food in the Last Year: Never true    Ran Out of Food in the Last Year: Never true  Transportation Needs: No Transportation Needs (11/13/2022)   PRAPARE - Administrator, Civil Service (Medical): No    Lack of Transportation  (Non-Medical): No  Physical Activity: Insufficiently Active (01/01/2020)   Exercise Vital Sign    Days of Exercise per Week: 7 days    Minutes of Exercise per Session: 20 min  Stress: No Stress Concern Present (01/01/2020)   [employer] of Occupational Health - Occupational Stress Questionnaire    Feeling of Stress : Not at all  Social Connections: Moderately Isolated (01/01/2020)   Social Connection and Isolation Panel [NHANES]    Frequency of Communication with Friends and Family: More than three times a week    Frequency of Social Gatherings with Friends and Family: More than three times a week    Attends Religious Services: Never    Database administrator or Organizations: No    Attends Banker Meetings: Never    Marital Status: Living with partner  Intimate Partner Violence: Not At Risk (11/13/2022)   Humiliation, Afraid, Rape, and Kick questionnaire    Fear of Current or Ex-Partner: No    Emotionally Abused: No    Physically Abused: No    Sexually Abused: No    Family History  Problem Relation Age of Onset   Heart attack Father    Hypertension Sister    Cancer Sister      Current Outpatient Medications:    acetaminophen (TYLENOL) 500 MG tablet, Take 1,000 mg by mouth daily as needed for mild pain or moderate pain., Disp: , Rfl:    acyclovir (ZOVIRAX) 400 MG tablet, Take 1 tablet (400 mg total) by mouth 2 (two) times daily., Disp: 60 tablet, Rfl: 2   ascorbic acid (VITAMIN C) 500 MG tablet, Take 1 tablet (500 mg total) by mouth daily., Disp: 90 tablet, Rfl: 0   aspirin EC 81 MG tablet, Take 81 mg by mouth in the morning., Disp: , Rfl:    atorvastatin (LIPITOR) 80 MG tablet, Take 1 tablet (80 mg total) by mouth at bedtime. Hold while taking Paxlovid, Disp: 30 tablet, Rfl: 2   Baclofen 5 MG TABS, Take 5 mg by mouth daily as needed (muscle spasm)., Disp: , Rfl:    docusate sodium (COLACE) 100 MG capsule, Take 100 mg by mouth daily as needed for mild  constipation., Disp: , Rfl:    Glucagon (BAQSIMI ONE PACK) 3 MG/DOSE POWD, Place 3 mg into the nose once as needed for up to 1 dose (for low sugar)., Disp: 1 each, Rfl: 1   insulin degludec (TRESIBA FLEXTOUCH) 100 UNIT/ML FlexTouch Pen, Inject 5 Units into the skin daily. Home med., Disp: , Rfl:    Multiple Vitamin (MULTIVITAMIN WITH MINERALS) TABS tablet, Take 1 tablet by mouth daily., Disp: 90 tablet, Rfl: 1   NOVOLOG 100 UNIT/ML injection, Sliding Scale:  Glucose 70 - 120: 0 units  Glucose 121 - 150: 1 unit  Glucose 151 - 200: 2 units Glucose 201 - 250: 3 units  Glucose 251 - 300: 5 units  Glucose 301 - 350: 7 units  Glucose 351 - 400: 9 units  Glucose > 400: call your docotr, Disp: 10 mL, Rfl: 1   ondansetron (ZOFRAN) 8 MG tablet, Take 8 mg by mouth daily as needed for nausea or vomiting., Disp: , Rfl:    pantoprazole (PROTONIX) 40 MG tablet, Take 1 tablet (40 mg total) by mouth 2 (two) times daily. Take twice daily for 2 months then decrease to once daily, Disp: , Rfl:    pomalidomide (POMALYST) 3 MG capsule, Take 1 capsule (3 mg total) by mouth daily. Take for 21 days, then hold for 7 days. Repeat every 28 days, Disp: 21 capsule, Rfl: 0 No current facility-administered medications for this visit.  Facility-Administered Medications Ordered in Other Visits:    alteplase (CATHFLO ACTIVASE) injection 2 mg, 2 mg, Intracatheter, Once PRN, Creig Hines, MD   epoetin alfa-epbx (RETACRIT) injection 40,000 Units, 40,000 Units, Subcutaneous, Q21 days, Creig Hines, MD, 40,000 Units at 11/27/22 1345   sodium chloride flush (NS) 0.9 % injection 10 mL, 10 mL, Intracatheter, Once PRN, Creig Hines, MD   sodium chloride flush (NS) 0.9 % injection 3 mL, 3 mL, Intracatheter, Once PRN, Creig Hines, MD  Physical exam:  Vitals:   11/27/22 1133  BP: 136/79  Pulse: 78  Resp: 18  Temp: 97.6 F (36.4 C)  TempSrc: Tympanic  SpO2: 100%  Weight: 155 lb (70.3 kg)  Height: 5\' 10"  (1.778 m)   Physical  Exam Constitutional:      Comments: Ambulates with a cane and appears in no acute distress  Cardiovascular:     Rate and Rhythm: Normal rate and regular rhythm.     Heart sounds: Normal heart sounds.  Pulmonary:     Effort: Pulmonary effort is normal.     Breath sounds: Normal breath sounds.  Abdominal:     General: Bowel sounds are normal.     Palpations: Abdomen is soft.  Skin:    General: Skin is warm and dry.  Neurological:     Mental Status: He is alert and oriented to person, place, and time.         Latest Ref Rng & Units 11/20/2022   11:16 AM  CMP  Glucose 70 - 99 mg/dL 161   BUN 8 - 23 mg/dL 48   Creatinine 0.96 - 1.24 mg/dL 0.45   Sodium 409 - 811 mmol/L 135   Potassium 3.5 - 5.1 mmol/L 5.1   Chloride 98 - 111 mmol/L 104   CO2 22 - 32 mmol/L 23   Calcium 8.9 - 10.3 mg/dL 8.2   Total Protein 6.5 - 8.1 g/dL 7.6   Total Bilirubin 0.3 - 1.2 mg/dL 0.3   Alkaline Phos 38 - 126 U/L 50   AST 15 - 41 U/L 24   ALT 0 - 44 U/L 33       Latest Ref Rng & Units 11/27/2022   11:19 AM  CBC  WBC 4.0 - 10.5 K/uL 3.0   Hemoglobin 13.0 - 17.0 g/dL 8.8   Hematocrit 91.4 - 52.0 % 27.5   Platelets 150 - 400 K/uL 203     No images are attached to the encounter.  DG Foot Complete Left  Result Date: 11/18/2022 Please see detailed radiograph report in  office note.  CT Head Wo Contrast  Result Date: 10/31/2022 CLINICAL DATA:  Mental status change, unknown cause EXAM: CT HEAD WITHOUT CONTRAST TECHNIQUE: Contiguous axial images were obtained from the base of the skull through the vertex without intravenous contrast. RADIATION DOSE REDUCTION: This exam was performed according to the departmental dose-optimization program which includes automated exposure control, adjustment of the mA and/or kV according to patient size and/or use of iterative reconstruction technique. COMPARISON:  Head CT 10/17/2022, brain MRI 10/18/2022 FINDINGS: Brain: No intracranial hemorrhage, mass effect, or  midline shift. No hydrocephalus. The basilar cisterns are patent. Stable chronic small vessel ischemia. No evidence of territorial infarct or acute ischemia. Chronic dural calcifications. No extra-axial or intracranial fluid collection. Vascular: Atherosclerosis of skullbase vasculature without hyperdense vessel or abnormal calcification. Skull: No fracture or focal lesion. Sinuses/Orbits: Paranasal sinuses and mastoid air cells are clear. The visualized orbits are unremarkable. Other: None. IMPRESSION: 1. No acute intracranial abnormality. 2. Stable chronic small vessel ischemia. Electronically Signed   By: Narda Rutherford M.D.   On: 10/31/2022 16:25     Assessment and plan- Patient is a 72 y.o. male with R-ISS stage III high risk IgG lambda multiple myeloma.he is here for cycle 5-day 1 of Darzalex  Today is dose 11 of Darzalex.  He will directly proceed with dose 12 in 2 weeks and I will see him back in 4 weeks for dose 13.  After 16 doses of Darzalex he will move onto monthly Darzalex.Patient has started Pomalyst 3 mg which she will be getting 3 weeks on and 1 week off starting today.  Repeat myeloma panel and serum free light chains in 4 weeks  Anemia of chronic kidney disease: Retacrit today and again in 4 weeks  Continue aspirin and acyclovir prophylaxis   Visit Diagnosis 1. Multiple myeloma not having achieved remission (HCC)   2. Encounter for antineoplastic chemotherapy   3. High risk medication use   4. Erythropoietin (EPO) stimulating agent anemia management patient      Dr. Owens Shark, MD, MPH [facility] [phone] 11/27/2022 1:55 PM

## 2022-11-27 NOTE — Patient Instructions (Signed)
[place] [facility] [place] [facility]  Discharge Instructions: Thank you for choosing [facility] to provide your oncology and hematology care.  If you have a lab appointment with the Cancer Center, please go directly to the Cancer Center and check in at the registration area.  Wear comfortable clothing and clothing appropriate for easy access to any Portacath or PICC line.   We strive to give you quality time with your provider. You may need to reschedule your appointment if you arrive late (15 or more minutes).  Arriving late affects you and other patients whose appointments are after yours.  Also, if you miss three or more appointments without notifying the office, you may be dismissed from the clinic at the provider's discretion.      For prescription refill requests, have your pharmacy contact our office and allow 72 hours for refills to be completed.     To help prevent nausea and vomiting after your treatment, we encourage you to take your nausea medication as directed.  BELOW ARE SYMPTOMS THAT SHOULD BE REPORTED IMMEDIATELY: *FEVER GREATER THAN 100.4 F (38 C) OR HIGHER *CHILLS OR SWEATING *NAUSEA AND VOMITING THAT IS NOT CONTROLLED WITH YOUR NAUSEA MEDICATION *UNUSUAL SHORTNESS OF BREATH *UNUSUAL BRUISING OR BLEEDING *URINARY PROBLEMS (pain or burning when urinating, or frequent urination) *BOWEL PROBLEMS (unusual diarrhea, constipation, pain near the anus) TENDERNESS IN MOUTH AND THROAT WITH OR WITHOUT PRESENCE OF ULCERS (sore throat, sores in mouth, or a toothache) UNUSUAL RASH, SWELLING OR PAIN  UNUSUAL VAGINAL DISCHARGE OR ITCHING   Items with * indicate a potential emergency and should be followed up as soon as possible or go to the Emergency Department if any problems should occur.  Please show the CHEMOTHERAPY ALERT CARD or IMMUNOTHERAPY ALERT CARD at check-in to the Emergency Department and triage nurse.  Should you have questions after your visit  or need to cancel or reschedule your appointment, please contact [place] [facility] [place] [facility]  [phone] and follow the prompts.  Office hours are 8:00 a.m. to 4:30 p.m. Monday - Friday. Please note that voicemails left after 4:00 p.m. may not be returned until the following business day.  We are closed weekends and major holidays. You have access to a nurse at all times for urgent questions. Please call the main number to the clinic [phone] and follow the prompts.  For any non-urgent questions, you may also contact your provider using MyChart. We now offer e-Visits for anyone 18 and older to request care online for non-urgent symptoms. For details visit [email or web address].   Also download the MyChart app! Go to the app store, search "MyChart", open the app, select [place], and log in with your MyChart username and password.    

## 2022-11-28 ENCOUNTER — Other Ambulatory Visit: Payer: Self-pay

## 2022-12-11 ENCOUNTER — Inpatient Hospital Stay: Payer: 59

## 2022-12-11 ENCOUNTER — Other Ambulatory Visit: Payer: Self-pay | Admitting: Oncology

## 2022-12-11 ENCOUNTER — Ambulatory Visit: Payer: 59 | Admitting: Oncology

## 2022-12-11 DIAGNOSIS — C9000 Multiple myeloma not having achieved remission: Secondary | ICD-10-CM

## 2022-12-12 ENCOUNTER — Other Ambulatory Visit: Payer: Self-pay

## 2022-12-14 ENCOUNTER — Other Ambulatory Visit: Payer: Self-pay | Admitting: Oncology

## 2022-12-14 DIAGNOSIS — C9000 Multiple myeloma not having achieved remission: Secondary | ICD-10-CM

## 2022-12-16 ENCOUNTER — Other Ambulatory Visit: Payer: Self-pay | Admitting: *Deleted

## 2022-12-16 DIAGNOSIS — C9000 Multiple myeloma not having achieved remission: Secondary | ICD-10-CM

## 2022-12-18 ENCOUNTER — Encounter: Payer: Self-pay | Admitting: Oncology

## 2022-12-18 ENCOUNTER — Telehealth: Payer: Self-pay | Admitting: *Deleted

## 2022-12-18 ENCOUNTER — Ambulatory Visit: Payer: 59 | Admitting: Oncology

## 2022-12-18 ENCOUNTER — Other Ambulatory Visit: Payer: Self-pay

## 2022-12-18 ENCOUNTER — Inpatient Hospital Stay: Payer: 59 | Attending: Oncology

## 2022-12-18 ENCOUNTER — Inpatient Hospital Stay (HOSPITAL_BASED_OUTPATIENT_CLINIC_OR_DEPARTMENT_OTHER): Payer: 59 | Admitting: Oncology

## 2022-12-18 ENCOUNTER — Other Ambulatory Visit: Payer: 59

## 2022-12-18 ENCOUNTER — Other Ambulatory Visit: Payer: Self-pay | Admitting: *Deleted

## 2022-12-18 ENCOUNTER — Ambulatory Visit: Payer: 59

## 2022-12-18 ENCOUNTER — Inpatient Hospital Stay: Payer: 59

## 2022-12-18 VITALS — BP 125/74 | HR 82 | Temp 96.8°F | Resp 20 | Wt 153.1 lb

## 2022-12-18 DIAGNOSIS — E1022 Type 1 diabetes mellitus with diabetic chronic kidney disease: Secondary | ICD-10-CM | POA: Insufficient documentation

## 2022-12-18 DIAGNOSIS — Z79899 Other long term (current) drug therapy: Secondary | ICD-10-CM | POA: Diagnosis not present

## 2022-12-18 DIAGNOSIS — T451X5A Adverse effect of antineoplastic and immunosuppressive drugs, initial encounter: Secondary | ICD-10-CM | POA: Diagnosis not present

## 2022-12-18 DIAGNOSIS — C9000 Multiple myeloma not having achieved remission: Secondary | ICD-10-CM | POA: Diagnosis not present

## 2022-12-18 DIAGNOSIS — I129 Hypertensive chronic kidney disease with stage 1 through stage 4 chronic kidney disease, or unspecified chronic kidney disease: Secondary | ICD-10-CM | POA: Diagnosis not present

## 2022-12-18 DIAGNOSIS — D701 Agranulocytosis secondary to cancer chemotherapy: Secondary | ICD-10-CM | POA: Diagnosis not present

## 2022-12-18 DIAGNOSIS — Z7961 Long term (current) use of immunomodulator: Secondary | ICD-10-CM | POA: Diagnosis not present

## 2022-12-18 DIAGNOSIS — Z5111 Encounter for antineoplastic chemotherapy: Secondary | ICD-10-CM | POA: Diagnosis not present

## 2022-12-18 DIAGNOSIS — R296 Repeated falls: Secondary | ICD-10-CM

## 2022-12-18 DIAGNOSIS — N183 Chronic kidney disease, stage 3 unspecified: Secondary | ICD-10-CM | POA: Diagnosis not present

## 2022-12-18 DIAGNOSIS — D631 Anemia in chronic kidney disease: Secondary | ICD-10-CM | POA: Diagnosis not present

## 2022-12-18 DIAGNOSIS — Z794 Long term (current) use of insulin: Secondary | ICD-10-CM | POA: Insufficient documentation

## 2022-12-18 DIAGNOSIS — Z5112 Encounter for antineoplastic immunotherapy: Secondary | ICD-10-CM | POA: Insufficient documentation

## 2022-12-18 DIAGNOSIS — Z79624 Long term (current) use of inhibitors of nucleotide synthesis: Secondary | ICD-10-CM | POA: Insufficient documentation

## 2022-12-18 LAB — CBC WITH DIFFERENTIAL/PLATELET
Abs Immature Granulocytes: 0.03 10*3/uL (ref 0.00–0.07)
Basophils Absolute: 0.1 10*3/uL (ref 0.0–0.1)
Basophils Relative: 3 %
Eosinophils Absolute: 0.3 10*3/uL (ref 0.0–0.5)
Eosinophils Relative: 10 %
HCT: 32.7 % — ABNORMAL LOW (ref 39.0–52.0)
Hemoglobin: 10.4 g/dL — ABNORMAL LOW (ref 13.0–17.0)
Immature Granulocytes: 1 %
Lymphocytes Relative: 36 %
Lymphs Abs: 0.9 10*3/uL (ref 0.7–4.0)
MCH: 30.5 pg (ref 26.0–34.0)
MCHC: 31.8 g/dL (ref 30.0–36.0)
MCV: 95.9 fL (ref 80.0–100.0)
Monocytes Absolute: 0.3 10*3/uL (ref 0.1–1.0)
Monocytes Relative: 13 %
Neutro Abs: 1 10*3/uL — ABNORMAL LOW (ref 1.7–7.7)
Neutrophils Relative %: 37 %
Platelets: 247 10*3/uL (ref 150–400)
RBC: 3.41 MIL/uL — ABNORMAL LOW (ref 4.22–5.81)
RDW: 15.2 % (ref 11.5–15.5)
WBC: 2.6 10*3/uL — ABNORMAL LOW (ref 4.0–10.5)
nRBC: 0 % (ref 0.0–0.2)

## 2022-12-18 LAB — COMPREHENSIVE METABOLIC PANEL
ALT: 17 U/L (ref 0–44)
AST: 16 U/L (ref 15–41)
Albumin: 3.6 g/dL (ref 3.5–5.0)
Alkaline Phosphatase: 55 U/L (ref 38–126)
Anion gap: 7 (ref 5–15)
BUN: 41 mg/dL — ABNORMAL HIGH (ref 8–23)
CO2: 23 mmol/L (ref 22–32)
Calcium: 8.8 mg/dL — ABNORMAL LOW (ref 8.9–10.3)
Chloride: 106 mmol/L (ref 98–111)
Creatinine, Ser: 1.96 mg/dL — ABNORMAL HIGH (ref 0.61–1.24)
GFR, Estimated: 36 mL/min — ABNORMAL LOW (ref >60)
Glucose, Bld: 213 mg/dL — ABNORMAL HIGH (ref 70–99)
Potassium: 4.3 mmol/L (ref 3.5–5.1)
Sodium: 136 mmol/L (ref 135–145)
Total Bilirubin: 0.5 mg/dL (ref 0.3–1.2)
Total Protein: 7.7 g/dL (ref 6.5–8.1)

## 2022-12-18 MED ORDER — SODIUM CHLORIDE 0.9 % IV SOLN
Freq: Once | INTRAVENOUS | Status: DC
  Filled 2022-12-18: qty 250

## 2022-12-18 MED ORDER — FAMOTIDINE IN NACL 20-0.9 MG/50ML-% IV SOLN: 20.0000 mg | Freq: Once | INTRAVENOUS | Status: DC | PRN

## 2022-12-18 MED ORDER — POMALIDOMIDE 3 MG PO CAPS: 3.0000 mg | ORAL_CAPSULE | Freq: Every day | ORAL | 0 refills | Status: DC

## 2022-12-18 MED ORDER — SODIUM CHLORIDE 0.9 % IV SOLN
Freq: Once | INTRAVENOUS | Status: DC | PRN
  Filled 2022-12-18: qty 250

## 2022-12-18 MED ORDER — DARATUMUMAB-HYALURONIDASE-FIHJ 1800-30000 MG-UT/15ML ~~LOC~~ SOLN
1800.0000 mg | Freq: Once | SUBCUTANEOUS | Status: AC
  Administered 2022-12-18: 1800 mg via SUBCUTANEOUS
  Filled 2022-12-18: qty 15

## 2022-12-18 MED ORDER — DEXAMETHASONE 4 MG PO TABS
10.0000 mg | ORAL_TABLET | Freq: Once | ORAL | Status: AC
  Administered 2022-12-18: 10 mg via ORAL
  Filled 2022-12-18: qty 3

## 2022-12-18 MED ORDER — SODIUM CHLORIDE 0.9 % IV SOLN: 200.0000 mg | Freq: Once | INTRAVENOUS | Status: DC

## 2022-12-18 MED ORDER — METHYLPREDNISOLONE SODIUM SUCC 125 MG IJ SOLR: 125.0000 mg | Freq: Once | INTRAMUSCULAR | Status: DC | PRN

## 2022-12-18 MED ORDER — EPINEPHRINE 0.3 MG/0.3ML IJ SOAJ: 0.3000 mg | Freq: Once | INTRAMUSCULAR | Status: DC | PRN

## 2022-12-18 MED ORDER — EPOETIN ALFA-EPBX 40000 UNIT/ML IJ SOLN: 40000.0000 [IU] | INTRAMUSCULAR | Status: DC

## 2022-12-18 MED ORDER — DIPHENHYDRAMINE HCL 50 MG/ML IJ SOLN: 50.0000 mg | Freq: Once | INTRAMUSCULAR | Status: DC | PRN

## 2022-12-18 MED ORDER — DIPHENHYDRAMINE HCL 25 MG PO CAPS
50.0000 mg | ORAL_CAPSULE | Freq: Once | ORAL | Status: AC
  Administered 2022-12-18: 50 mg via ORAL
  Filled 2022-12-18: qty 2

## 2022-12-18 MED ORDER — ACETAMINOPHEN 325 MG PO TABS
650.0000 mg | ORAL_TABLET | Freq: Once | ORAL | Status: AC
  Administered 2022-12-18: 650 mg via ORAL
  Filled 2022-12-18: qty 2

## 2022-12-18 MED ORDER — ALBUTEROL SULFATE HFA 108 (90 BASE) MCG/ACT IN AERS: 2.0000 | INHALATION_SPRAY | Freq: Once | RESPIRATORY_TRACT | Status: DC | PRN

## 2022-12-18 NOTE — Patient Instructions (Signed)
[place] [facility]  Discharge Instructions: Thank you for choosing [facility] to provide your oncology and hematology care.  If you have a lab appointment with the Cancer Center, please go directly to the Cancer Center and check in at the registration area.  Wear comfortable clothing and clothing appropriate for easy access to any Portacath or PICC line.   We strive to give you quality time with your provider. You may need to reschedule your appointment if you arrive late (15 or more minutes).  Arriving late affects you and other patients whose appointments are after yours.  Also, if you miss three or more appointments without notifying the office, you may be dismissed from the clinic at the provider's discretion.      For prescription refill requests, have your pharmacy contact our office and allow 72 hours for refills to be completed.    Today you received the following chemotherapy and/or immunotherapy agents Darzalex.      To help prevent nausea and vomiting after your treatment, we encourage you to take your nausea medication as directed.  BELOW ARE SYMPTOMS THAT SHOULD BE REPORTED IMMEDIATELY: *FEVER GREATER THAN 100.4 F (38 C) OR HIGHER *CHILLS OR SWEATING *NAUSEA AND VOMITING THAT IS NOT CONTROLLED WITH YOUR NAUSEA MEDICATION *UNUSUAL SHORTNESS OF BREATH *UNUSUAL BRUISING OR BLEEDING *URINARY PROBLEMS (pain or burning when urinating, or frequent urination) *BOWEL PROBLEMS (unusual diarrhea, constipation, pain near the anus) TENDERNESS IN MOUTH AND THROAT WITH OR WITHOUT PRESENCE OF ULCERS (sore throat, sores in mouth, or a toothache) UNUSUAL RASH, SWELLING OR PAIN  UNUSUAL VAGINAL DISCHARGE OR ITCHING   Items with * indicate a potential emergency and should be followed up as soon as possible or go to the Emergency Department if any problems should occur.  Please show the CHEMOTHERAPY ALERT CARD or IMMUNOTHERAPY ALERT CARD at check-in to  the Emergency Department and triage nurse.  Should you have questions after your visit or need to cancel or reschedule your appointment, please contact [place] [facility]  [phone] and follow the prompts.  Office hours are 8:00 a.m. to 4:30 p.m. Monday - Friday. Please note that voicemails left after 4:00 p.m. may not be returned until the following business day.  We are closed weekends and major holidays. You have access to a nurse at all times for urgent questions. Please call the main number to the clinic [phone] and follow the prompts.  For any non-urgent questions, you may also contact your provider using MyChart. We now offer e-Visits for anyone 36 and older to request care online for non-urgent symptoms. For details visit [email or web address].   Also download the MyChart app! Go to the app store, search "MyChart", open the app, select , and log in with your MyChart username and password.

## 2022-12-18 NOTE — Progress Notes (Signed)
Hematology/Oncology Consult note [facility]  Telephone:([phone] [phone]  Patient Care Team: Marisue Ivan, MD as PCP - General (Family Medicine) Pa,  Eye Care (Optometry) Creig Hines, MD as Consulting Physician (Oncology)   Name of the patient: Nicholas Weiss  [id number]  October 17, 1950   Date of visit: 12/18/22  Diagnosis- high risk IgG lambda multiple myeloma R-ISS stage III    Chief complaint/ Reason for visit-On treatment assessment prior to next dose of Darzalex  Heme/Onc history:  Patient is a 72 year old African-American male with a past medical history significant for uncontrolled type 1 diabetes, chronic kidney disease stage III chronic normocytic anemia referred for abnormal SPEP. Patient's creatinine has been fluctuating between 1.5-2 but about 5 months ago it went up all the way to 5 and his blood sugars were in the 1000 range. More recently his kidney numbers have been drifting back to normal values. As a part of the work-up he had serum protein electrophoresis done which showed an elevated gammaglobulin fraction with an M spike of 3%. The amount of M spike was not quantified in the specimen. He has therefore been referred to [place] for further management. Patient endorses chronic fatigue reports that his appetite and weight have remained stable   Results of blood work from 04/08/2020 were as follows: CBC showed white count of 2.9, H&H of 10.5/31.2 with an MCV of 91 and a platelet count of 191.  CMP showed a mildly elevated creatinine of 1.2 which was better as compared to 5 months ago when it was 3.9.  Total protein was mildly elevated at 9.1 calcium normal at 8.4 ferritin and iron studies B12 folate TSH and haptoglobin were normal.  Myeloma panel revealed an elevated IgG level of 06/04/2004 with an M protein of 3.1 g.  Immunofixation showed IgG lambda specificity.  Serum free light chain ratio was elevated at 25 and free light chain  lambda elevated at 370   Further myeloma work-up including a PET CT scan did not reveal any evidence of edematous lesions.  Bone marrow biopsy showed 23% plasma cells by manual count and 30% by CD138 IHC.  Normal cytogenetics.  FISH studies for myeloma showed gain of 1 q.  13 q-. detected.  P53 not detected.   He was treated for a year and has having smoldering multiple myeloma since both CKD and anemia have been stable for 3 to 4 years and could be secondary to uncontrolled diabetes.In June 2023 IgG levels increased to 5463 with an M protein of 4.1 g as compared to 2.6 g in September 2022.  Serum free light chain ratio remains around 25.  Therefore a repeat bone marrow biopsy was done which shows further increase in plasma cells ranging from 30 to 70% and by immunohistochemistry 60 to 70% of the cells were positive for CD138.  Cytogenetics normal.  FISH study showed 4: 14 translocation and gain of 1 q. making this high risk RISS stage III.  Repeat PET scan showed no lytic lesions   Patient is not a transplant candidate and was started on Velcade Revlimid dexamethasone regimen in July 2023.  He was also referred to [facility] for second opinion but did not go.  Treatment complicated by repeated hospitalizations for uncontrolled blood sugars.  He has been frequently hospitalized thereby causing multiple interruptions in his treatment. Due to lack of adequate response to Vrd darzalex was added in may 2024. M protein remained around 2 g despite adding Darzalex to  the regimen and therefore plan to switch to Pomalyst plus Darzalex and stop Velcade and Revlimid altogether starting August 2024    Interval history-tolerating Pomalyst well without any significant side effects.  He does report numbness in his legs and unsteadiness when he ambulates.  He was again admittedTo the hospital in August 2024 for another episode of hypoglycemia  ECOG PS- 2 Pain scale- 0   Review of systems- Review of Systems  Neurological:         Gait imbalance      Allergies  Allergen Reactions   Penicillins Other (See Comments)    Childhood allergy -tolerated amoxil 03/2022 Unknown reaction Has patient had a PCN reaction causing immediate rash, facial/tongue/throat swelling, SOB or lightheadedness with hypotension: No Has patient had a PCN reaction causing severe rash involving mucus membranes or skin necrosis: No Has patient had a PCN reaction that required hospitalization No Has patient had a PCN reaction occurring within the last 10 years: No If all of the above answers are "NO", then may proceed with Cephalosporin use.      Past Medical History:  Diagnosis Date   Acute metabolic encephalopathy 12/02/2020   DKA (diabetic ketoacidoses) 04/06/2016   Hypercholesteremia    Hypertension      Past Surgical History:  Procedure Laterality Date   AMPUTATION TOE Left 04/25/2022   Procedure: LEFT GREAT TOE AMPUTATION AND LEFT PARTIAL 2ND TOE AMPUTATION;  Surgeon: Felecia Shelling, DPM;  Location: [facility] ORS;  Service: Podiatry;  Laterality: Left;   COLONOSCOPY WITH PROPOFOL N/A 02/01/2020   Procedure: COLONOSCOPY WITH PROPOFOL;  Surgeon: Toney Reil, MD;  Location: [facility] ENDOSCOPY;  Service: Gastroenterology;  Laterality: N/A;   ESOPHAGOGASTRODUODENOSCOPY  02/01/2020   Procedure: ESOPHAGOGASTRODUODENOSCOPY (EGD);  Surgeon: Toney Reil, MD;  Location: [facility] ENDOSCOPY;  Service: Gastroenterology;;   ESOPHAGOGASTRODUODENOSCOPY (EGD) WITH PROPOFOL N/A 06/07/2022   Procedure: ESOPHAGOGASTRODUODENOSCOPY (EGD) WITH PROPOFOL;  Surgeon: Kerin Salen, MD;  Location: [facility] ENDOSCOPY;  Service: Gastroenterology;  Laterality: N/A;   KNEE SURGERY Right    Torn meniscus   KNEE SURGERY Left     Social History   Socioeconomic History   Marital status: Single    Spouse name: Not on file   Number of children: 3   Years of education: Not on file   Highest education level: High school graduate  Occupational History    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  Tobacco Use   Smoking status: Never   Smokeless tobacco: Never  Vaping Use   Vaping status: Never Used  Substance and Sexual Activity   Alcohol use: Not Currently    Alcohol/week: 0.0 - 1.0 standard drinks of alcohol    Comment: "once every 2 months"   Drug use: Yes    Types: Marijuana, "Crack" cocaine    Comment: last week   Sexual activity: Not Currently    Birth control/protection: None  Other Topics Concern   Not on file  Social History Narrative   Lives with girlfriend "sharon"   Social Determinants of Health   Financial Resource Strain: Low Risk  (08/05/2021)   Received [facility], [facility]   Overall Financial Resource Strain (CARDIA)    Difficulty of Paying Living Expenses: Not hard at all  Food Insecurity: No Food Insecurity (11/13/2022)   Hunger Vital Sign    Worried About Running Out of Food in the Last Year: Never true    Ran Out of Food in the Last Year: Never true  Transportation  Needs: No Transportation Needs (11/13/2022)   PRAPARE - Administrator, Civil Service (Medical): No    Lack of Transportation (Non-Medical): No  Physical Activity: Insufficiently Active (01/01/2020)   Exercise Vital Sign    Days of Exercise per Week: 7 days    Minutes of Exercise per Session: 20 min  Stress: No Stress Concern Present (01/01/2020)   [employer] of Occupational Health - Occupational Stress Questionnaire    Feeling of Stress : Not at all  Social Connections: Moderately Isolated (01/01/2020)   Social Connection and Isolation Panel [NHANES]    Frequency of Communication with Friends and Family: More than three times a week    Frequency of Social Gatherings with Friends and Family: More than three times a week    Attends Religious Services: Never    Database administrator or Organizations: No    Attends Banker Meetings: Never    Marital Status: Living with partner  Intimate Partner Violence: Not At Risk  (11/13/2022)   Humiliation, Afraid, Rape, and Kick questionnaire    Fear of Current or Ex-Partner: No    Emotionally Abused: No    Physically Abused: No    Sexually Abused: No    Family History  Problem Relation Age of Onset   Heart attack Father    Hypertension Sister    Cancer Sister      Current Outpatient Medications:    acetaminophen (TYLENOL) 500 MG tablet, Take 1,000 mg by mouth daily as needed for mild pain or moderate pain., Disp: , Rfl:    acyclovir (ZOVIRAX) 400 MG tablet, Take 1 tablet (400 mg total) by mouth 2 (two) times daily., Disp: 60 tablet, Rfl: 2   ascorbic acid (VITAMIN C) 500 MG tablet, Take 1 tablet (500 mg total) by mouth daily., Disp: 90 tablet, Rfl: 0   aspirin EC 81 MG tablet, Take 81 mg by mouth in the morning., Disp: , Rfl:    atorvastatin (LIPITOR) 80 MG tablet, Take 1 tablet (80 mg total) by mouth at bedtime. Hold while taking Paxlovid, Disp: 30 tablet, Rfl: 2   Baclofen 5 MG TABS, Take 5 mg by mouth daily as needed (muscle spasm)., Disp: , Rfl:    docusate sodium (COLACE) 100 MG capsule, Take 100 mg by mouth daily as needed for mild constipation., Disp: , Rfl:    Glucagon (BAQSIMI ONE PACK) 3 MG/DOSE POWD, Place 3 mg into the nose once as needed for up to 1 dose (for low sugar)., Disp: 1 each, Rfl: 1   insulin degludec (TRESIBA FLEXTOUCH) 100 UNIT/ML FlexTouch Pen, Inject 5 Units into the skin daily. Home med., Disp: , Rfl:    lisinopril (ZESTRIL) 2.5 MG tablet, Take by mouth., Disp: , Rfl:    Multiple Vitamin (MULTIVITAMIN WITH MINERALS) TABS tablet, Take 1 tablet by mouth daily., Disp: 90 tablet, Rfl: 1   NOVOLOG 100 UNIT/ML injection, Sliding Scale: Glucose 70 - 120: 0 units  Glucose 121 - 150: 1 unit  Glucose 151 - 200: 2 units Glucose 201 - 250: 3 units  Glucose 251 - 300: 5 units  Glucose 301 - 350: 7 units  Glucose 351 - 400: 9 units  Glucose > 400: call your docotr, Disp: 10 mL, Rfl: 1   ondansetron (ZOFRAN) 8 MG tablet, Take 8 mg by mouth daily as  needed for nausea or vomiting., Disp: , Rfl:    pantoprazole (PROTONIX) 40 MG tablet, Take 1 tablet (40 mg total) by mouth 2 (  two) times daily. Take twice daily for 2 months then decrease to once daily, Disp: , Rfl:    pomalidomide (POMALYST) 3 MG capsule, Take 1 capsule (3 mg total) by mouth daily. Take for 21 days, then hold for 7 days. Repeat every 28 days, Disp: 21 capsule, Rfl: 0 No current facility-administered medications for this visit.  Facility-Administered Medications Ordered in Other Visits:    0.9 %  sodium chloride infusion, , Intravenous, Once, Alinda Dooms, NP   0.9 %  sodium chloride infusion, , Intravenous, Once PRN, Alinda Dooms, NP  Physical exam:  Vitals:   12/18/22 1102  BP: 125/74  Pulse: 82  Resp: 20  Temp: (!) 96.8 F (36 C)  SpO2: 100%  Weight: 153 lb 1.6 oz (69.4 kg)   Physical Exam Cardiovascular:     Rate and Rhythm: Normal rate and regular rhythm.     Heart sounds: Normal heart sounds.  Pulmonary:     Effort: Pulmonary effort is normal.     Breath sounds: Normal breath sounds.  Abdominal:     General: Bowel sounds are normal.     Palpations: Abdomen is soft.  Skin:    General: Skin is warm and dry.  Neurological:     Mental Status: He is alert and oriented to person, place, and time.         Latest Ref Rng & Units 12/18/2022   10:48 AM  CMP  Glucose 70 - 99 mg/dL 161   BUN 8 - 23 mg/dL 41   Creatinine 0.96 - 1.24 mg/dL 0.45   Sodium 409 - 811 mmol/L 136   Potassium 3.5 - 5.1 mmol/L 4.3   Chloride 98 - 111 mmol/L 106   CO2 22 - 32 mmol/L 23   Calcium 8.9 - 10.3 mg/dL 8.8   Total Protein 6.5 - 8.1 g/dL 7.7   Total Bilirubin 0.3 - 1.2 mg/dL 0.5   Alkaline Phos 38 - 126 U/L 55   AST 15 - 41 U/L 16   ALT 0 - 44 U/L 17       Latest Ref Rng & Units 12/18/2022   10:48 AM  CBC  WBC 4.0 - 10.5 K/uL 2.6   Hemoglobin 13.0 - 17.0 g/dL 91.4   Hematocrit 78.2 - 52.0 % 32.7   Platelets 150 - 400 K/uL 247     No images are attached to  the encounter.  DG Foot Complete Left  Result Date: 11/18/2022 Please see detailed radiograph report in office note.    Assessment and plan- Patient is a 72 y.o. male with R-ISS stage III high risk IgG lambda multiple myeloma.  He is here for on treatment assessment prior to dose 13 of Darzalex  Patient is presently on Darzalex Pomalyst regimen.  Counts okay to proceed with dose 13 of Darzalex today.  I will see him back in 2 weeks for dose 14.  After 18 doses he will move to monthly Darzalex.  His Pomalyst is not in sync with his Darzalex due to delay in his treatments from recurrent hospitalizations for hypoglycemia.  He stopped his Pomalyst yesterday and have asked him to restart his next cycle on 01/01/2023.  Patient will continue low-dose aspirin and acyclovir prophylaxis.  I will check myeloma panel and serum free light chains in 2 weeks.  If there is a consistent increase in his numbers despite Pomalyst Darzalex regimen over the next 6 to 8 weeks I will consider switching him to Pomalyst carfilzomib based  on his cardiac functions.  Anemia of chronic kidney disease: Hemoglobin currently at 10.4 and we will hold off on giving him Retacrit at this time.  Gait and balance: Suspect clinically he has neuropathy secondary to his alcohol use as well as diabetes.  I am referring him to neurology Dr. Barbaraann Cao for this   Visit Diagnosis 1. Multiple myeloma not having achieved remission (HCC)   2. Encounter for antineoplastic chemotherapy   3. High risk medication use      Dr. Owens Shark, MD, MPH [facility] [phone] 12/18/2022 1:01 PM

## 2022-12-18 NOTE — Telephone Encounter (Signed)
attempted to reach patient. no answer. re: pt needs to complete his pomalyst pt celegene survey. Patient has an apt today at 35. Will speak to patient when he comes to office today.

## 2022-12-19 ENCOUNTER — Emergency Department
Admission: EM | Admit: 2022-12-19 | Discharge: 2022-12-19 | Disposition: A | Discharge status: Home / Self Care | Payer: 59 | Attending: Emergency Medicine | Admitting: Emergency Medicine

## 2022-12-19 ENCOUNTER — Other Ambulatory Visit: Payer: Self-pay

## 2022-12-19 DIAGNOSIS — E1022 Type 1 diabetes mellitus with diabetic chronic kidney disease: Secondary | ICD-10-CM | POA: Diagnosis not present

## 2022-12-19 DIAGNOSIS — I951 Orthostatic hypotension: Secondary | ICD-10-CM | POA: Diagnosis present

## 2022-12-19 DIAGNOSIS — E860 Dehydration: Secondary | ICD-10-CM

## 2022-12-19 DIAGNOSIS — I129 Hypertensive chronic kidney disease with stage 1 through stage 4 chronic kidney disease, or unspecified chronic kidney disease: Secondary | ICD-10-CM | POA: Insufficient documentation

## 2022-12-19 DIAGNOSIS — Z5112 Encounter for antineoplastic immunotherapy: Secondary | ICD-10-CM | POA: Diagnosis not present

## 2022-12-19 DIAGNOSIS — N183 Chronic kidney disease, stage 3 unspecified: Secondary | ICD-10-CM | POA: Diagnosis not present

## 2022-12-19 LAB — URINALYSIS, ROUTINE W REFLEX MICROSCOPIC
Bacteria, UA: NONE SEEN
Bilirubin Urine: NEGATIVE
Glucose, UA: >=500 mg/dL — AB
Hgb urine dipstick: NEGATIVE
Ketones, ur: NEGATIVE mg/dL
Leukocytes,Ua: NEGATIVE
Nitrite: NEGATIVE
Protein, ur: NEGATIVE mg/dL
Specific Gravity, Urine: 1.008 (ref 1.005–1.030)
pH: 5 (ref 5.0–8.0)

## 2022-12-19 LAB — BASIC METABOLIC PANEL
Anion gap: 8 (ref 5–15)
BUN: 57 mg/dL — ABNORMAL HIGH (ref 8–23)
CO2: 20 mmol/L — ABNORMAL LOW (ref 22–32)
Calcium: 8.5 mg/dL — ABNORMAL LOW (ref 8.9–10.3)
Chloride: 103 mmol/L (ref 98–111)
Creatinine, Ser: 2.42 mg/dL — ABNORMAL HIGH (ref 0.61–1.24)
GFR, Estimated: 28 mL/min — ABNORMAL LOW (ref >60)
Glucose, Bld: 203 mg/dL — ABNORMAL HIGH (ref 70–99)
Potassium: 4.3 mmol/L (ref 3.5–5.1)
Sodium: 131 mmol/L — ABNORMAL LOW (ref 135–145)

## 2022-12-19 LAB — CBG MONITORING, ED: Glucose-Capillary: 189 mg/dL — ABNORMAL HIGH (ref 70–99)

## 2022-12-19 LAB — CBC
HCT: 26.1 % — ABNORMAL LOW (ref 39.0–52.0)
Hemoglobin: 8.5 g/dL — ABNORMAL LOW (ref 13.0–17.0)
MCH: 30.5 pg (ref 26.0–34.0)
MCHC: 32.6 g/dL (ref 30.0–36.0)
MCV: 93.5 fL (ref 80.0–100.0)
Platelets: 238 10*3/uL (ref 150–400)
RBC: 2.79 MIL/uL — ABNORMAL LOW (ref 4.22–5.81)
RDW: 15 % (ref 11.5–15.5)
WBC: 3 10*3/uL — ABNORMAL LOW (ref 4.0–10.5)
nRBC: 0 % (ref 0.0–0.2)

## 2022-12-19 MED ORDER — LACTATED RINGERS IV BOLUS
1000.0000 mL | Freq: Once | INTRAVENOUS | Status: AC
  Administered 2022-12-19: 1000 mL via INTRAVENOUS

## 2022-12-19 NOTE — ED Triage Notes (Addendum)
First nurse note: Pt to ED via [facility]. Family called for multiple syncopal episodes today. Pt initial BP 60s systolic sitting and upon standing BP not palpable per EMS. Pt with fluids running. 22g RH. CBG 200. Last BP 96/72. HR 70s. 100% RA. Pt is cancer pt.

## 2022-12-19 NOTE — ED Notes (Addendum)
Pt reports not on blood thinners . Denies cp

## 2022-12-19 NOTE — ED Provider Notes (Signed)
[facility] Provider Note    Event Date/Time   First MD Initiated Contact with Patient 12/19/22 1522     (approximate)   History   Loss of Consciousness and Hypotension   HPI Nicholas Weiss is a 72 y.o. male with multiple myeloma, DM 1, HTN, HLD, CKD stage III presenting today for syncope.  Patient states he had 2 episodes today where he got up and was walking around and got lightheaded.  This is not unusual for him given his medical history with multiple myeloma and type 1 diabetes.  During both of these times he fell like he was going to pass out and leaned up against the wall 1 time and the other time son lowered him down into a chair.  He states he may have passed out for a second but was right back to being awake afterwards.  Otherwise denies chest pain, shortness of breath, palpitations, dizziness, vision changes.  Patient is still undergoing treatment for multiple myeloma with most recent treatment yesterday.  Often feels this way after treatment.  Did have 1 episode of nausea and vomiting yesterday.  Has had poor p.o. intake in the past 24 hours.  Denies hitting his head at any point.  At this time, feels back to his baseline.     Physical Exam   Triage Vital Signs: ED Triage Vitals [12/19/22 1422]  Encounter Vitals Group     BP 101/65     Systolic BP Percentile      Diastolic BP Percentile      Pulse Rate 69     Resp 18     Temp 97.9 F (36.6 C)     Temp Source Oral     SpO2 100 %     Weight 153 lb (69.4 kg)     Height 5\' 10"  (1.778 m)     Head Circumference      Peak Flow      Pain Score 0     Pain Loc      Pain Education      Exclude from Growth Chart     Most recent vital signs: Vitals:   12/19/22 1542 12/19/22 1630  BP: (!) 167/98 (!) 177/104  Pulse: 69 71  Resp:    Temp:    SpO2: 99% 100%   Physical Exam: I have reviewed the vital signs and nursing notes. General: Awake, alert, no acute distress.  Nontoxic appearing. Head:   Atraumatic, normocephalic.   ENT:  EOM intact, PERRL. Oral mucosa is pink and moist with no lesions. Neck: Neck is supple with full range of motion, No meningeal signs. Cardiovascular:  RRR, No murmurs. Peripheral pulses palpable and equal bilaterally. Respiratory:  Symmetrical chest wall expansion.  No rhonchi, rales, or wheezes.  Good air movement throughout.  No use of accessory muscles.   Musculoskeletal:  No cyanosis or edema. Moving extremities with full ROM Abdomen:  Soft, nontender, nondistended. Neuro:  GCS 15, moving all four extremities, interacting appropriately. Speech clear. Psych:  Calm, appropriate.   Skin:  Warm, dry, no rash.    ED Results / Procedures / Treatments   Labs (all labs ordered are listed, but only abnormal results are displayed) Labs Reviewed  BASIC METABOLIC PANEL - Abnormal; Notable for the following components:      Result Value   Sodium 131 (*)    CO2 20 (*)    Glucose, Bld 203 (*)    BUN 57 (*)  Creatinine, Ser 2.42 (*)    Calcium 8.5 (*)    GFR, Estimated 28 (*)    All other components within normal limits  CBC - Abnormal; Notable for the following components:   WBC 3.0 (*)    RBC 2.79 (*)    Hemoglobin 8.5 (*)    HCT 26.1 (*)    All other components within normal limits  URINALYSIS, ROUTINE W REFLEX MICROSCOPIC - Abnormal; Notable for the following components:   Color, Urine STRAW (*)    APPearance CLEAR (*)    Glucose, UA >=500 (*)    All other components within normal limits  CBG MONITORING, ED - Abnormal; Notable for the following components:   Glucose-Capillary 189 (*)    All other components within normal limits     EKG My EKG interpretation: Rate of 66, sinus rhythm with prolonged PR interval at 416.  QTc 459.  Normal axis.  No acute ST elevation or depression.  I do see prior EKGs most notably on 09/18/2022 with prolonged PR interval in the past as well.   RADIOLOGY    PROCEDURES:  Critical Care performed:  No  Procedures   MEDICATIONS ORDERED IN ED: Medications  lactated ringers bolus 1,000 mL (1,000 mLs Intravenous New Bag/Given 12/19/22 1608)     IMPRESSION / MDM / ASSESSMENT AND PLAN / ED COURSE  I reviewed the triage vital signs and the nursing notes.                              Differential diagnosis includes, but is not limited to, orthostatic hypotension, dehydration, syncope, cardiac arrhythmia, electrolyte abnormality.  Patient's presentation is most consistent with exacerbation of chronic illness.  Patient is a 72 year old male actively undergoing cancer treatment presenting today for orthostatic hypotension with lightheadedness.  Hypotension present on arrival and patient given 1 L of fluids.  Laboratory workup does show slight worsening of creatinine function but overall not significantly decreased from baseline.  UA unremarkable.  After getting 1 L fluids, patient is asymptomatic and able to ambulate around the ED without any issues.  Notes prior history of lightheadedness secondary to cancer treatments and this is not a new event for him.  Now the patient feels better, he does wish to go home at this time.  I do feel safe with letting him go as he is asymptomatic and this is similar to prior events related to dehydration.  Was told to follow-up with cancer team on Monday and given strict return precautions.  The patient is on the cardiac monitor to evaluate for evidence of arrhythmia and/or significant heart rate changes. Clinical Course as of 12/19/22 1753  Sat Dec 19, 2022  1546 BP(!): 167/98 [DW]  1546 Basic metabolic panel(!) Slight creatinine elevation along with hyponatremia and hypocarbia when compared to yesterday.  Did receive treatment for his multiple myeloma had 1 episode of nausea with vomiting and slightly decreased p.o. intake over the past day likely representing mild dehydration. [DW]  1649 Patient ambulated the room without difficulty.  Asymptomatic.  Blood  pressure still within normal limits without hypotension.  Plan for discharge once urine results are back [DW]  1744 Urinalysis, Routine w reflex microscopic -Urine, Clean Catch(!) No acute pathology [DW]    Clinical Course User Index [DW] Janith Lima, MD     FINAL CLINICAL IMPRESSION(S) / ED DIAGNOSES   Final diagnoses:  Dehydration  Orthostatic hypotension  Rx / DC Orders   ED Discharge Orders     None        Note:  This document was prepared using Dragon voice recognition software and may include unintentional dictation errors.   Janith Lima, MD 12/19/22 [phone]

## 2022-12-19 NOTE — ED Notes (Signed)
Pt walked around room. Gait steady with no complaints from the pt.

## 2022-12-19 NOTE — Discharge Instructions (Addendum)
You were seen in the emergency department today for your lightheadedness and low blood pressure.  At this time, it appears all related to dehydration likely following your cancer treatments.  Please continue hydrating at home as much as possible.  Please follow-up with your regular doctor in the next couple of days to recheck lab workup.  Please return the emergency department if you continue to have trouble tolerating fluids or food at home with increasing lightheadedness.

## 2022-12-25 ENCOUNTER — Inpatient Hospital Stay: Payer: 59

## 2022-12-25 ENCOUNTER — Encounter: Payer: Self-pay | Admitting: Oncology

## 2022-12-25 ENCOUNTER — Inpatient Hospital Stay (HOSPITAL_BASED_OUTPATIENT_CLINIC_OR_DEPARTMENT_OTHER): Payer: 59 | Admitting: Oncology

## 2022-12-25 VITALS — BP 116/84 | HR 78 | Temp 96.4°F | Resp 18 | Ht 70.0 in | Wt 148.3 lb

## 2022-12-25 DIAGNOSIS — Z5111 Encounter for antineoplastic chemotherapy: Secondary | ICD-10-CM

## 2022-12-25 DIAGNOSIS — D702 Other drug-induced agranulocytosis: Secondary | ICD-10-CM

## 2022-12-25 DIAGNOSIS — C9000 Multiple myeloma not having achieved remission: Secondary | ICD-10-CM | POA: Diagnosis not present

## 2022-12-25 DIAGNOSIS — Z5112 Encounter for antineoplastic immunotherapy: Secondary | ICD-10-CM | POA: Diagnosis not present

## 2022-12-25 LAB — COMPREHENSIVE METABOLIC PANEL
ALT: 29 U/L (ref 0–44)
AST: 34 U/L (ref 15–41)
Albumin: 3.7 g/dL (ref 3.5–5.0)
Alkaline Phosphatase: 54 U/L (ref 38–126)
Anion gap: 9 (ref 5–15)
BUN: 59 mg/dL — ABNORMAL HIGH (ref 8–23)
CO2: 22 mmol/L (ref 22–32)
Calcium: 8.9 mg/dL (ref 8.9–10.3)
Chloride: 102 mmol/L (ref 98–111)
Creatinine, Ser: 1.9 mg/dL — ABNORMAL HIGH (ref 0.61–1.24)
GFR, Estimated: 38 mL/min — ABNORMAL LOW (ref >60)
Glucose, Bld: 238 mg/dL — ABNORMAL HIGH (ref 70–99)
Potassium: 4.6 mmol/L (ref 3.5–5.1)
Sodium: 133 mmol/L — ABNORMAL LOW (ref 135–145)
Total Bilirubin: 0.5 mg/dL (ref 0.3–1.2)
Total Protein: 7.9 g/dL (ref 6.5–8.1)

## 2022-12-25 LAB — CBC WITH DIFFERENTIAL/PLATELET
Abs Immature Granulocytes: 0.01 10*3/uL (ref 0.00–0.07)
Basophils Absolute: 0.1 10*3/uL (ref 0.0–0.1)
Basophils Relative: 2 %
Eosinophils Absolute: 0.1 10*3/uL (ref 0.0–0.5)
Eosinophils Relative: 4 %
HCT: 32.6 % — ABNORMAL LOW (ref 39.0–52.0)
Hemoglobin: 10.4 g/dL — ABNORMAL LOW (ref 13.0–17.0)
Immature Granulocytes: 0 %
Lymphocytes Relative: 58 %
Lymphs Abs: 1.3 10*3/uL (ref 0.7–4.0)
MCH: 30.2 pg (ref 26.0–34.0)
MCHC: 31.9 g/dL (ref 30.0–36.0)
MCV: 94.8 fL (ref 80.0–100.0)
Monocytes Absolute: 0.3 10*3/uL (ref 0.1–1.0)
Monocytes Relative: 13 %
Neutro Abs: 0.5 10*3/uL — ABNORMAL LOW (ref 1.7–7.7)
Neutrophils Relative %: 23 %
Platelets: 296 10*3/uL (ref 150–400)
RBC: 3.44 MIL/uL — ABNORMAL LOW (ref 4.22–5.81)
RDW: 14.7 % (ref 11.5–15.5)
WBC: 2.3 10*3/uL — ABNORMAL LOW (ref 4.0–10.5)
nRBC: 0 % (ref 0.0–0.2)

## 2022-12-25 NOTE — Progress Notes (Signed)
Hematology/Oncology Consult note [facility]  [phone] [phone]  Patient Care Team: Marisue Ivan, MD as PCP - General (Family Medicine) [place], [facility] (Optometry) Creig Hines, MD as Consulting Physician (Oncology)   Name of the patient: Nicholas Weiss  [id number]  Aug 25, 1950   Date of visit: 12/25/22  Diagnosis- high risk IgG lambda multiple myeloma R-ISS stage III   Chief complaint/ Reason for visit-on treatment assessment prior to next dose of Darzalex  Heme/Onc history: Patient is a 72 year old African-American male with a past medical history significant for uncontrolled type 1 diabetes, chronic kidney disease stage III chronic normocytic anemia referred for abnormal SPEP. Patient's creatinine has been fluctuating between 1.5-2 but about 5 months ago it went up all the way to 5 and his blood sugars were in the 1000 range. More recently his kidney numbers have been drifting back to normal values. As a part of the work-up he had serum protein electrophoresis done which showed an elevated gammaglobulin fraction with an M spike of 3%. The amount of M spike was not quantified in the specimen. He has therefore been referred to [place] for further management. Patient endorses chronic fatigue reports that his appetite and weight have remained stable   Results of blood work from 04/08/2020 were as follows: CBC showed white count of 2.9, H&H of 10.5/31.2 with an MCV of 91 and a platelet count of 191.  CMP showed a mildly elevated creatinine of 1.2 which was better as compared to 5 months ago when it was 3.9.  Total protein was mildly elevated at 9.1 calcium normal at 8.4 ferritin and iron studies B12 folate TSH and haptoglobin were normal.  Myeloma panel revealed an elevated IgG level of 06/04/2004 with an M protein of 3.1 g.  Immunofixation showed IgG lambda specificity.  Serum free light chain ratio was elevated at 25 and free light chain  lambda elevated at 370   Further myeloma work-up including a PET CT scan did not reveal any evidence of edematous lesions.  Bone marrow biopsy showed 23% plasma cells by manual count and 30% by CD138 IHC.  Normal cytogenetics.  FISH studies for myeloma showed gain of 1 q.  13 q-. detected.  P53 not detected.   He was treated for a year and has having smoldering multiple myeloma since both CKD and anemia have been stable for 3 to 4 years and could be secondary to uncontrolled diabetes.In June 2023 IgG levels increased to 5463 with an M protein of 4.1 g as compared to 2.6 g in September 2022.  Serum free light chain ratio remains around 25.  Therefore a repeat bone marrow biopsy was done which shows further increase in plasma cells ranging from 30 to 70% and by immunohistochemistry 60 to 70% of the cells were positive for CD138.  Cytogenetics normal.  FISH study showed 4: 14 translocation and gain of 1 q. making this high risk RISS stage III.  Repeat PET scan showed no lytic lesions   Patient is not a transplant candidate and was started on Velcade Revlimid dexamethasone regimen in July 2023.  He was also referred to [facility] for second opinion but did not go.  Treatment complicated by repeated hospitalizations for uncontrolled blood sugars.  He has been frequently hospitalized thereby causing multiple interruptions in his treatment. Due to lack of adequate response to Vrd darzalex was added in may 2024. M protein remained around 2 g despite adding Darzalex to the regimen  and therefore plan to switch to Pomalyst plus Darzalex and stop Velcade and Revlimid altogether starting August 2024    Interval history-patient was in the ER for another episode of syncope but was discharged on the same day.  He received fluids.  He has not restarted his Pomalyst yet  ECOG PS- 2 Pain scale- 0   Review of systems- Review of Systems  Constitutional:  Positive for malaise/fatigue. Negative for chills, fever and weight loss.   HENT:  Negative for congestion, ear discharge and nosebleeds.   Eyes:  Negative for blurred vision.  Respiratory:  Negative for cough, hemoptysis, sputum production, shortness of breath and wheezing.   Cardiovascular:  Negative for chest pain, palpitations, orthopnea and claudication.  Gastrointestinal:  Negative for abdominal pain, blood in stool, constipation, diarrhea, heartburn, melena, nausea and vomiting.  Genitourinary:  Negative for dysuria, flank pain, frequency, hematuria and urgency.  Musculoskeletal:  Negative for back pain, joint pain and myalgias.  Skin:  Negative for rash.  Neurological:  Negative for dizziness, tingling, focal weakness, seizures, weakness and headaches.  Endo/Heme/Allergies:  Does not bruise/bleed easily.  Psychiatric/Behavioral:  Negative for depression and suicidal ideas. The patient does not have insomnia.       Allergies  Allergen Reactions   Penicillins Other (See Comments)    Childhood allergy -tolerated amoxil 03/2022 Unknown reaction Has patient had a PCN reaction causing immediate rash, facial/tongue/throat swelling, SOB or lightheadedness with hypotension: No Has patient had a PCN reaction causing severe rash involving mucus membranes or skin necrosis: No Has patient had a PCN reaction that required hospitalization No Has patient had a PCN reaction occurring within the last 10 years: No If all of the above answers are "NO", then may proceed with Cephalosporin use.      Past Medical History:  Diagnosis Date   Acute metabolic encephalopathy 12/02/2020   DKA (diabetic ketoacidoses) 04/06/2016   Hypercholesteremia    Hypertension      Past Surgical History:  Procedure Laterality Date   AMPUTATION TOE Left 04/25/2022   Procedure: LEFT GREAT TOE AMPUTATION AND LEFT PARTIAL 2ND TOE AMPUTATION;  Surgeon: Felecia Shelling, DPM;  Location: [facility] ORS;  Service: Podiatry;  Laterality: Left;   COLONOSCOPY WITH PROPOFOL N/A 02/01/2020   Procedure:  COLONOSCOPY WITH PROPOFOL;  Surgeon: Toney Reil, MD;  Location: [facility] ENDOSCOPY;  Service: Gastroenterology;  Laterality: N/A;   ESOPHAGOGASTRODUODENOSCOPY  02/01/2020   Procedure: ESOPHAGOGASTRODUODENOSCOPY (EGD);  Surgeon: Toney Reil, MD;  Location: [facility] ENDOSCOPY;  Service: Gastroenterology;;   ESOPHAGOGASTRODUODENOSCOPY (EGD) WITH PROPOFOL N/A 06/07/2022   Procedure: ESOPHAGOGASTRODUODENOSCOPY (EGD) WITH PROPOFOL;  Surgeon: Kerin Salen, MD;  Location: [facility] ENDOSCOPY;  Service: Gastroenterology;  Laterality: N/A;   KNEE SURGERY Right    Torn meniscus   KNEE SURGERY Left     Social History   Socioeconomic History   Marital status: Single    Spouse name: Not on file   Number of children: 3   Years of education: Not on file   Highest education level: High school graduate  Occupational History    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  Tobacco Use   Smoking status: Never   Smokeless tobacco: Never  Vaping Use   Vaping status: Never Used  Substance and Sexual Activity   Alcohol use: Not Currently    Alcohol/week: 0.0 - 1.0 standard drinks of alcohol    Comment: "once every 2 months"   Drug use: Yes    Types: Marijuana, "Crack" cocaine    Comment:  last week   Sexual activity: Not Currently    Birth control/protection: None  Other Topics Concern   Not on file  Social History Narrative   Lives with girlfriend "sharon"   Social Determinants of Health   Financial Resource Strain: Low Risk  (08/05/2021)   [facility]   Overall Financial Resource Strain (CARDIA)    Difficulty of Paying Living Expenses: Not hard at all  Food Insecurity: No Food Insecurity (11/13/2022)   Hunger Vital Sign    Worried About Running Out of Food in the Last Year: Never true    Ran Out of Food in the Last Year: Never true  Transportation Needs: No Transportation Needs (11/13/2022)   PRAPARE - Administrator, Civil Service (Medical): No    Lack of  Transportation (Non-Medical): No  Physical Activity: Insufficiently Active (01/01/2020)   Exercise Vital Sign    Days of Exercise per Week: 7 days    Minutes of Exercise per Session: 20 min  Stress: No Stress Concern Present (01/01/2020)   [employer] of Occupational Health - Occupational Stress Questionnaire    Feeling of Stress : Not at all  Social Connections: Moderately Isolated (01/01/2020)   Social Connection and Isolation Panel [NHANES]    Frequency of Communication with Friends and Family: More than three times a week    Frequency of Social Gatherings with Friends and Family: More than three times a week    Attends Religious Services: Never    Database administrator or Organizations: No    Attends Banker Meetings: Never    Marital Status: Living with partner  Intimate Partner Violence: Not At Risk (11/13/2022)   Humiliation, Afraid, Rape, and Kick questionnaire    Fear of Current or Ex-Partner: No    Emotionally Abused: No    Physically Abused: No    Sexually Abused: No    Family History  Problem Relation Age of Onset   Heart attack Father    Hypertension Sister    Cancer Sister      Current Outpatient Medications:    acetaminophen (TYLENOL) 500 MG tablet, Take 1,000 mg by mouth daily as needed for mild pain or moderate pain., Disp: , Rfl:    acyclovir (ZOVIRAX) 400 MG tablet, Take 1 tablet (400 mg total) by mouth 2 (two) times daily., Disp: 60 tablet, Rfl: 2   ascorbic acid (VITAMIN C) 500 MG tablet, Take 1 tablet (500 mg total) by mouth daily., Disp: 90 tablet, Rfl: 0   aspirin EC 81 MG tablet, Take 81 mg by mouth in the morning., Disp: , Rfl:    atorvastatin (LIPITOR) 80 MG tablet, Take 1 tablet (80 mg total) by mouth at bedtime. Hold while taking Paxlovid, Disp: 30 tablet, Rfl: 2   Baclofen 5 MG TABS, Take 5 mg by mouth daily as needed (muscle spasm)., Disp: , Rfl:    docusate sodium (COLACE) 100 MG capsule, Take 100 mg by mouth daily as needed for  mild constipation., Disp: , Rfl:    Glucagon (BAQSIMI ONE PACK) 3 MG/DOSE POWD, Place 3 mg into the nose once as needed for up to 1 dose (for low sugar)., Disp: 1 each, Rfl: 1   insulin degludec (TRESIBA FLEXTOUCH) 100 UNIT/ML FlexTouch Pen, Inject 5 Units into the skin daily. Home med., Disp: , Rfl:    lisinopril (ZESTRIL) 2.5 MG tablet, Take by mouth., Disp: , Rfl:    Multiple Vitamin (MULTIVITAMIN WITH MINERALS)  TABS tablet, Take 1 tablet by mouth daily., Disp: 90 tablet, Rfl: 1   NOVOLOG 100 UNIT/ML injection, Sliding Scale: Glucose 70 - 120: 0 units  Glucose 121 - 150: 1 unit  Glucose 151 - 200: 2 units Glucose 201 - 250: 3 units  Glucose 251 - 300: 5 units  Glucose 301 - 350: 7 units  Glucose 351 - 400: 9 units  Glucose > 400: call your docotr, Disp: 10 mL, Rfl: 1   ondansetron (ZOFRAN) 8 MG tablet, Take 8 mg by mouth daily as needed for nausea or vomiting., Disp: , Rfl:    pantoprazole (PROTONIX) 40 MG tablet, Take 1 tablet (40 mg total) by mouth 2 (two) times daily. Take twice daily for 2 months then decrease to once daily, Disp: , Rfl:    pomalidomide (POMALYST) 3 MG capsule, Take 1 capsule (3 mg total) by mouth daily. Take for 21 days, then hold for 7 days. Repeat every 28 days, Disp: 21 capsule, Rfl: 0  Physical exam:  Vitals:   12/25/22 1137  BP: 116/84  Pulse: 78  Resp: 18  Temp: (!) 96.4 F (35.8 C)  TempSrc: Tympanic  SpO2: 100%  Weight: 148 lb 4.8 oz (67.3 kg)  Height: 5\' 10"  (1.778 m)   Physical Exam Cardiovascular:     Rate and Rhythm: Normal rate and regular rhythm.     Heart sounds: Normal heart sounds.  Pulmonary:     Effort: Pulmonary effort is normal.     Breath sounds: Normal breath sounds.  Abdominal:     General: Bowel sounds are normal.     Palpations: Abdomen is soft.  Skin:    General: Skin is warm and dry.  Neurological:     Mental Status: He is alert and oriented to person, place, and time.         Latest Ref Rng & Units 12/25/2022   11:01 AM   CMP  Glucose 70 - 99 mg/dL 161   BUN 8 - 23 mg/dL 59   Creatinine 0.96 - 1.24 mg/dL 0.45   Sodium 409 - 811 mmol/L 133   Potassium 3.5 - 5.1 mmol/L 4.6   Chloride 98 - 111 mmol/L 102   CO2 22 - 32 mmol/L 22   Calcium 8.9 - 10.3 mg/dL 8.9   Total Protein 6.5 - 8.1 g/dL 7.9   Total Bilirubin 0.3 - 1.2 mg/dL 0.5   Alkaline Phos 38 - 126 U/L 54   AST 15 - 41 U/L 34   ALT 0 - 44 U/L 29       Latest Ref Rng & Units 12/25/2022   11:01 AM  CBC  WBC 4.0 - 10.5 K/uL 2.3   Hemoglobin 13.0 - 17.0 g/dL 91.4   Hematocrit 78.2 - 52.0 % 32.6   Platelets 150 - 400 K/uL 296     Assessment and plan- Patient is a 72 y.o. male  with R-ISS stage III high risk IgG lambda multiple myeloma.  He is here for on treatment assessment prior to dose 14 of Darzalex  Patient is not due for his Darzalex today.  He will be getting dose 14 of Darzalex next week.  I will see him back 5 weeks from today for dose 16.  If his ANC is more than 1 next week he will restart Pomalyst 3 weeks on 1 week off at the dose of 3 mg.  However if his ANC is not more than a month it will be delayed by 1  week and I will consider dose reducing Pomalyst further to 2 mg.  Anemia of chronic disease: Hemoglobin stable around 10 and he does not require any Retacrit at this time  Myeloma labs and free light chains are pending from today   Visit Diagnosis 1. Multiple myeloma not having achieved remission (HCC)   2. Encounter for antineoplastic chemotherapy   3. Drug-induced neutropenia (HCC)      Dr. Owens Shark, MD, MPH [facility] [phone] 12/25/2022 4:14 PM

## 2022-12-26 ENCOUNTER — Other Ambulatory Visit: Payer: Self-pay

## 2022-12-28 ENCOUNTER — Other Ambulatory Visit: Payer: Self-pay

## 2022-12-28 LAB — KAPPA/LAMBDA LIGHT CHAINS
Kappa free light chain: 8.3 mg/L (ref 3.3–19.4)
Kappa, lambda light chain ratio: 0.06 — ABNORMAL LOW (ref 0.26–1.65)
Lambda free light chains: 133.4 mg/L — ABNORMAL HIGH (ref 5.7–26.3)

## 2022-12-29 ENCOUNTER — Encounter: Payer: Self-pay | Admitting: Obstetrics and Gynecology

## 2022-12-29 ENCOUNTER — Other Ambulatory Visit: Payer: Self-pay

## 2022-12-29 ENCOUNTER — Observation Stay
Admission: EM | Admit: 2022-12-29 | Discharge: 2022-12-30 | Disposition: A | Discharge status: Home / Self Care | Payer: 59 | Attending: Internal Medicine | Admitting: Internal Medicine

## 2022-12-29 DIAGNOSIS — R680 Hypothermia, not associated with low environmental temperature: Secondary | ICD-10-CM | POA: Insufficient documentation

## 2022-12-29 DIAGNOSIS — E162 Hypoglycemia, unspecified: Principal | ICD-10-CM | POA: Diagnosis present

## 2022-12-29 DIAGNOSIS — Z89412 Acquired absence of left great toe: Secondary | ICD-10-CM | POA: Insufficient documentation

## 2022-12-29 DIAGNOSIS — E1065 Type 1 diabetes mellitus with hyperglycemia: Secondary | ICD-10-CM

## 2022-12-29 DIAGNOSIS — Z7982 Long term (current) use of aspirin: Secondary | ICD-10-CM | POA: Insufficient documentation

## 2022-12-29 DIAGNOSIS — R2689 Other abnormalities of gait and mobility: Secondary | ICD-10-CM | POA: Diagnosis not present

## 2022-12-29 DIAGNOSIS — T68XXXA Hypothermia, initial encounter: Secondary | ICD-10-CM | POA: Insufficient documentation

## 2022-12-29 DIAGNOSIS — N1832 Chronic kidney disease, stage 3b: Secondary | ICD-10-CM | POA: Insufficient documentation

## 2022-12-29 DIAGNOSIS — I10 Essential (primary) hypertension: Secondary | ICD-10-CM | POA: Diagnosis present

## 2022-12-29 DIAGNOSIS — D631 Anemia in chronic kidney disease: Secondary | ICD-10-CM | POA: Insufficient documentation

## 2022-12-29 DIAGNOSIS — Z8673 Personal history of transient ischemic attack (TIA), and cerebral infarction without residual deficits: Secondary | ICD-10-CM | POA: Diagnosis not present

## 2022-12-29 DIAGNOSIS — Z89422 Acquired absence of other left toe(s): Secondary | ICD-10-CM | POA: Insufficient documentation

## 2022-12-29 DIAGNOSIS — Z8719 Personal history of other diseases of the digestive system: Secondary | ICD-10-CM

## 2022-12-29 DIAGNOSIS — E1022 Type 1 diabetes mellitus with diabetic chronic kidney disease: Secondary | ICD-10-CM | POA: Insufficient documentation

## 2022-12-29 DIAGNOSIS — M6281 Muscle weakness (generalized): Secondary | ICD-10-CM | POA: Insufficient documentation

## 2022-12-29 DIAGNOSIS — Z794 Long term (current) use of insulin: Secondary | ICD-10-CM | POA: Diagnosis not present

## 2022-12-29 DIAGNOSIS — Z79899 Other long term (current) drug therapy: Secondary | ICD-10-CM | POA: Insufficient documentation

## 2022-12-29 DIAGNOSIS — Z8579 Personal history of other malignant neoplasms of lymphoid, hematopoietic and related tissues: Secondary | ICD-10-CM

## 2022-12-29 DIAGNOSIS — N183 Chronic kidney disease, stage 3 unspecified: Secondary | ICD-10-CM | POA: Diagnosis present

## 2022-12-29 DIAGNOSIS — I129 Hypertensive chronic kidney disease with stage 1 through stage 4 chronic kidney disease, or unspecified chronic kidney disease: Secondary | ICD-10-CM | POA: Insufficient documentation

## 2022-12-29 DIAGNOSIS — E109 Type 1 diabetes mellitus without complications: Secondary | ICD-10-CM | POA: Diagnosis present

## 2022-12-29 DIAGNOSIS — E11649 Type 2 diabetes mellitus with hypoglycemia without coma: Secondary | ICD-10-CM | POA: Diagnosis present

## 2022-12-29 DIAGNOSIS — N401 Enlarged prostate with lower urinary tract symptoms: Secondary | ICD-10-CM | POA: Diagnosis present

## 2022-12-29 DIAGNOSIS — D638 Anemia in other chronic diseases classified elsewhere: Secondary | ICD-10-CM | POA: Diagnosis present

## 2022-12-29 DIAGNOSIS — F1410 Cocaine abuse, uncomplicated: Secondary | ICD-10-CM | POA: Diagnosis present

## 2022-12-29 DIAGNOSIS — Z7409 Other reduced mobility: Secondary | ICD-10-CM | POA: Insufficient documentation

## 2022-12-29 LAB — URINALYSIS, ROUTINE W REFLEX MICROSCOPIC
Bilirubin Urine: NEGATIVE
Glucose, UA: >=500 mg/dL — AB
Hgb urine dipstick: NEGATIVE
Ketones, ur: NEGATIVE mg/dL
Leukocytes,Ua: NEGATIVE
Nitrite: NEGATIVE
Protein, ur: NEGATIVE mg/dL
Specific Gravity, Urine: 1.015 (ref 1.005–1.030)
pH: 5 (ref 5.0–8.0)

## 2022-12-29 LAB — BASIC METABOLIC PANEL
Anion gap: 8 (ref 5–15)
BUN: 60 mg/dL — ABNORMAL HIGH (ref 8–23)
CO2: 22 mmol/L (ref 22–32)
Calcium: 8.6 mg/dL — ABNORMAL LOW (ref 8.9–10.3)
Chloride: 105 mmol/L (ref 98–111)
Creatinine, Ser: 2.16 mg/dL — ABNORMAL HIGH (ref 0.61–1.24)
GFR, Estimated: 32 mL/min — ABNORMAL LOW (ref >60)
Glucose, Bld: 122 mg/dL — ABNORMAL HIGH (ref 70–99)
Potassium: 4.5 mmol/L (ref 3.5–5.1)
Sodium: 135 mmol/L (ref 135–145)

## 2022-12-29 LAB — MULTIPLE MYELOMA PANEL, SERUM
Albumin SerPl Elph-Mcnc: 3.6 g/dL (ref 2.9–4.4)
Albumin/Glob SerPl: 0.9 (ref 0.7–1.7)
Alpha 1: 0.2 g/dL (ref 0.0–0.4)
Alpha2 Glob SerPl Elph-Mcnc: 0.8 g/dL (ref 0.4–1.0)
B-Globulin SerPl Elph-Mcnc: 0.9 g/dL (ref 0.7–1.3)
Gamma Glob SerPl Elph-Mcnc: 2.3 g/dL — ABNORMAL HIGH (ref 0.4–1.8)
Globulin, Total: 4.3 g/dL — ABNORMAL HIGH (ref 2.2–3.9)
IgA: 22 mg/dL — ABNORMAL LOW (ref 61–437)
IgG (Immunoglobin G), Serum: 2743 mg/dL — ABNORMAL HIGH (ref 603–1613)
IgM (Immunoglobulin M), Srm: <5 mg/dL — ABNORMAL LOW (ref 20–172)
M Protein SerPl Elph-Mcnc: 2 g/dL — ABNORMAL HIGH
Total Protein ELP: 7.9 g/dL (ref 6.0–8.5)

## 2022-12-29 LAB — GLUCOSE, CAPILLARY
Glucose-Capillary: 358 mg/dL — ABNORMAL HIGH (ref 70–99)
Glucose-Capillary: 158 mg/dL — ABNORMAL HIGH (ref 70–99)

## 2022-12-29 LAB — CBC WITH DIFFERENTIAL/PLATELET
Abs Immature Granulocytes: 0 10*3/uL (ref 0.00–0.07)
Basophils Absolute: 0 10*3/uL (ref 0.0–0.1)
Basophils Relative: 1 %
Eosinophils Absolute: 0.1 10*3/uL (ref 0.0–0.5)
Eosinophils Relative: 4 %
HCT: 30.6 % — ABNORMAL LOW (ref 39.0–52.0)
Hemoglobin: 9.7 g/dL — ABNORMAL LOW (ref 13.0–17.0)
Immature Granulocytes: 0 %
Lymphocytes Relative: 57 %
Lymphs Abs: 1.7 10*3/uL (ref 0.7–4.0)
MCH: 30.2 pg (ref 26.0–34.0)
MCHC: 31.7 g/dL (ref 30.0–36.0)
MCV: 95.3 fL (ref 80.0–100.0)
Monocytes Absolute: 0.6 10*3/uL (ref 0.1–1.0)
Monocytes Relative: 19 %
Neutro Abs: 0.6 10*3/uL — ABNORMAL LOW (ref 1.7–7.7)
Neutrophils Relative %: 19 %
Platelets: 313 10*3/uL (ref 150–400)
RBC: 3.21 MIL/uL — ABNORMAL LOW (ref 4.22–5.81)
RDW: 15.3 % (ref 11.5–15.5)
Smear Review: NORMAL
WBC: 3 10*3/uL — ABNORMAL LOW (ref 4.0–10.5)
nRBC: 0 % (ref 0.0–0.2)

## 2022-12-29 LAB — PATHOLOGIST SMEAR REVIEW

## 2022-12-29 LAB — CBG MONITORING, ED
Glucose-Capillary: 68 mg/dL — ABNORMAL LOW (ref 70–99)
Glucose-Capillary: 159 mg/dL — ABNORMAL HIGH (ref 70–99)
Glucose-Capillary: 364 mg/dL — ABNORMAL HIGH (ref 70–99)

## 2022-12-29 MED ORDER — ASPIRIN 81 MG PO TBEC
81.0000 mg | DELAYED_RELEASE_TABLET | Freq: Every morning | ORAL | Status: DC
  Administered 2022-12-30: 81 mg via ORAL
  Filled 2022-12-29: qty 1

## 2022-12-29 MED ORDER — DEXTROSE-SODIUM CHLORIDE 5-0.45 % IV SOLN: INTRAVENOUS | Status: DC

## 2022-12-29 MED ORDER — INSULIN GLARGINE-YFGN 100 UNIT/ML ~~LOC~~ SOLN
5.0000 [IU] | Freq: Once | SUBCUTANEOUS | Status: AC
  Administered 2022-12-29: 5 [IU] via SUBCUTANEOUS
  Filled 2022-12-29: qty 0.05

## 2022-12-29 MED ORDER — DEXTROSE 50 % IV SOLN
1.0000 | Freq: Once | INTRAVENOUS | Status: AC
  Administered 2022-12-29: 50 mL via INTRAVENOUS
  Filled 2022-12-29: qty 50

## 2022-12-29 MED ORDER — LISINOPRIL 2.5 MG PO TABS
2.5000 mg | ORAL_TABLET | Freq: Every day | ORAL | Status: DC
  Administered 2022-12-29 – 2022-12-30 (×2): 2.5 mg via ORAL
  Filled 2022-12-29 (×2): qty 1

## 2022-12-29 MED ORDER — INSULIN ASPART 100 UNIT/ML IJ SOLN: 0.0000 [IU] | Freq: Every day | INTRAMUSCULAR | Status: DC

## 2022-12-29 MED ORDER — ENOXAPARIN SODIUM 40 MG/0.4ML IJ SOSY
40.0000 mg | PREFILLED_SYRINGE | INTRAMUSCULAR | Status: DC
  Administered 2022-12-29 – 2022-12-30 (×2): 40 mg via SUBCUTANEOUS
  Filled 2022-12-29 (×2): qty 0.4

## 2022-12-29 MED ORDER — INSULIN ASPART 100 UNIT/ML IJ SOLN: 0.0000 [IU] | Freq: Three times a day (TID) | INTRAMUSCULAR | Status: DC

## 2022-12-29 MED ORDER — DEXTROSE-SODIUM CHLORIDE 10-0.45 % IV SOLN
INTRAVENOUS | Status: DC
  Filled 2022-12-29: qty 1000

## 2022-12-29 MED ORDER — INSULIN ASPART 100 UNIT/ML IJ SOLN
0.0000 [IU] | Freq: Three times a day (TID) | INTRAMUSCULAR | Status: DC
  Administered 2022-12-29: 9 [IU] via SUBCUTANEOUS
  Administered 2022-12-30 (×2): 2 [IU] via SUBCUTANEOUS
  Filled 2022-12-29 (×3): qty 1

## 2022-12-29 MED ORDER — ATORVASTATIN CALCIUM 20 MG PO TABS
80.0000 mg | ORAL_TABLET | Freq: Every day | ORAL | Status: DC
  Administered 2022-12-29: 80 mg via ORAL
  Filled 2022-12-29: qty 4

## 2022-12-29 MED ORDER — ACYCLOVIR 200 MG PO CAPS
400.0000 mg | ORAL_CAPSULE | Freq: Two times a day (BID) | ORAL | Status: DC
  Administered 2022-12-29 – 2022-12-30 (×2): 400 mg via ORAL
  Filled 2022-12-29 (×2): qty 2

## 2022-12-29 NOTE — ED Notes (Signed)
Pt denies any complaints. States he wants to go home and sleep

## 2022-12-29 NOTE — ED Notes (Signed)
Informed RN bed assigned 

## 2022-12-29 NOTE — H&P (Signed)
History and Physical    Nicholas Weiss:[id number] DOB: 1950/04/25 DOA: 12/29/2022  PCP: Marisue Ivan, MD  Patient coming from: home   Chief Complaint: hypoglycemia  HPI: Nicholas Weiss is a 72 y.o. male with medical history significant for ckd 3a, ongoing cocaine abuse, htn, cva, gi bleed, active multiple myeloma on darzalex, t1dm, presenting with the above.  Drank sweet tea yesterday afternoon and evening glucose was in the 400s so gave more long-acting insulin than he normally does (14 units instead of normal 8). This morning found minimally responsive by girlfriend so ems called, glucose per report was 33, given a d10 bolus with improvement to the 200s. On arrival to the ER initial glucose 122, patient started on glucose gtt, repeat glucose in the 68 so decision made to admit. Patient also found to be hypothermic, he denies fever, cough, abdominal or other pain, no vomiting or diarrhea, no dysuria. Reports last cocaine (smoked) was several days ago.    Review of Systems: As per HPI otherwise 10 point review of systems negative.    Past Medical History:  Diagnosis Date   Acute metabolic encephalopathy 12/02/2020   DKA (diabetic ketoacidoses) 04/06/2016   Hypercholesteremia    Hypertension     Past Surgical History:  Procedure Laterality Date   AMPUTATION TOE Left 04/25/2022   Procedure: LEFT GREAT TOE AMPUTATION AND LEFT PARTIAL 2ND TOE AMPUTATION;  Surgeon: Felecia Shelling, DPM;  Location: [facility] ORS;  Service: Podiatry;  Laterality: Left;   COLONOSCOPY WITH PROPOFOL N/A 02/01/2020   Procedure: COLONOSCOPY WITH PROPOFOL;  Surgeon: Toney Reil, MD;  Location: [facility] ENDOSCOPY;  Service: Gastroenterology;  Laterality: N/A;   ESOPHAGOGASTRODUODENOSCOPY  02/01/2020   Procedure: ESOPHAGOGASTRODUODENOSCOPY (EGD);  Surgeon: Toney Reil, MD;  Location: [facility] ENDOSCOPY;  Service: Gastroenterology;;   ESOPHAGOGASTRODUODENOSCOPY (EGD) WITH PROPOFOL N/A 06/07/2022    Procedure: ESOPHAGOGASTRODUODENOSCOPY (EGD) WITH PROPOFOL;  Surgeon: Kerin Salen, MD;  Location: [facility] ENDOSCOPY;  Service: Gastroenterology;  Laterality: N/A;   KNEE SURGERY Right    Torn meniscus   KNEE SURGERY Left      reports that he has never smoked. He has never used smokeless tobacco. He reports that he does not currently use alcohol. He reports current drug use. Drugs: Marijuana and "Crack" cocaine.  Allergies  Allergen Reactions   Penicillins Other (See Comments)    Childhood allergy -tolerated amoxil 03/2022 Unknown reaction Has patient had a PCN reaction causing immediate rash, facial/tongue/throat swelling, SOB or lightheadedness with hypotension: No Has patient had a PCN reaction causing severe rash involving mucus membranes or skin necrosis: No Has patient had a PCN reaction that required hospitalization No Has patient had a PCN reaction occurring within the last 10 years: No If all of the above answers are "NO", then may proceed with Cephalosporin use.     Family History  Problem Relation Age of Onset   Heart attack Father    Hypertension Sister    Cancer Sister     Prior to Admission medications   Medication Sig Start Date End Date Taking? Authorizing Provider  acetaminophen (TYLENOL) 500 MG tablet Take 1,000 mg by mouth daily as needed for mild pain or moderate pain.    [provider]  acyclovir (ZOVIRAX) 400 MG tablet Take 1 tablet (400 mg total) by mouth 2 (two) times daily. 08/06/22   Creig Hines, MD  ascorbic acid (VITAMIN C) 500 MG tablet Take 1 tablet (500 mg total) by mouth daily. 04/09/22   Arnetha Courser,  MD  aspirin EC 81 MG tablet Take 81 mg by mouth in the morning.    [provider]  atorvastatin (LIPITOR) 80 MG tablet Take 1 tablet (80 mg total) by mouth at bedtime. Hold while taking Paxlovid 04/08/22   Arnetha Courser, MD  Baclofen 5 MG TABS Take 5 mg by mouth daily as needed (muscle spasm).    [provider]  docusate sodium  (COLACE) 100 MG capsule Take 100 mg by mouth daily as needed for mild constipation.    [provider]  Glucagon (BAQSIMI ONE PACK) 3 MG/DOSE POWD Place 3 mg into the nose once as needed for up to 1 dose (for low sugar). 07/22/22   Concha Se, MD  insulin degludec (TRESIBA FLEXTOUCH) 100 UNIT/ML FlexTouch Pen Inject 5 Units into the skin daily. Home med. 11/16/22   Darlin Priestly, MD  lisinopril (ZESTRIL) 2.5 MG tablet Take by mouth. 11/20/22   [provider]  Multiple Vitamin (MULTIVITAMIN WITH MINERALS) TABS tablet Take 1 tablet by mouth daily. 04/09/22   Arnetha Courser, MD  NOVOLOG 100 UNIT/ML injection Sliding Scale: Glucose 70 - 120: 0 units  Glucose 121 - 150: 1 unit  Glucose 151 - 200: 2 units Glucose 201 - 250: 3 units  Glucose 251 - 300: 5 units  Glucose 301 - 350: 7 units  Glucose 351 - 400: 9 units  Glucose > 400: call your docotr 06/12/22   Jerald Kief, MD  ondansetron (ZOFRAN) 8 MG tablet Take 8 mg by mouth daily as needed for nausea or vomiting.    [provider]  pantoprazole (PROTONIX) 40 MG tablet Take 1 tablet (40 mg total) by mouth 2 (two) times daily. Take twice daily for 2 months then decrease to once daily 09/20/22   Loyce Dys, MD  pomalidomide (POMALYST) 3 MG capsule Take 1 capsule (3 mg total) by mouth daily. Take for 21 days, then hold for 7 days. Repeat every 28 days 12/18/22   Creig Hines, MD    Physical Exam: Vitals:   12/29/22 1000 12/29/22 1026 12/29/22 1100 12/29/22 1130  BP: (!) 182/110 (!) 189/98 136/84 (!) 150/86  Pulse: 72  71 72  Resp: 15  12 (!) 25  Temp:  (!) 94.6 F (34.8 C)    TempSrc:  Rectal    SpO2: 100%  100% 100%  Weight:      Height:        Constitutional: No acute distress Head: Atraumatic Eyes: Conjunctiva clear ENM: Moist mucous membranes.   Respiratory: Clear to auscultation bilaterally, no wheezing/rales/rhonchi. Normal respiratory effort. No accessory muscle use. . Cardiovascular: Regular rate and  rhythm. No murmurs/rubs/gallops. Abdomen: Non-tender, non-distended.   Musculoskeletal: No joint deformity upper and lower extremities.   Skin: No rashes, lesions, or ulcers.  Extremities: No peripheral edema. Palpable peripheral pulses. Neurologic: Alert, moving all 4 extremities. Psychiatric: Normal insight and judgement.   Labs on Admission: I have personally reviewed following labs and imaging studies  CBC: Recent Labs  Lab 12/25/22 1101 12/29/22 0856  WBC 2.3* 3.0*  NEUTROABS 0.5* 0.6*  HGB 10.4* 9.7*  HCT 32.6* 30.6*  MCV 94.8 95.3  PLT 296 313   Basic Metabolic Panel: Recent Labs  Lab 12/25/22 1101 12/29/22 0856  NA 133* 135  K 4.6 4.5  CL 102 105  CO2 22 22  GLUCOSE 238* 122*  BUN 59* 60*  CREATININE 1.90* 2.16*  CALCIUM 8.9 8.6*   GFR: Estimated Creatinine Clearance: 32.6  mL/min (A) (by C-G formula based on SCr of 2.16 mg/dL (H)). Liver Function Tests: Recent Labs  Lab 12/25/22 1101  AST 34  ALT 29  ALKPHOS 54  BILITOT 0.5  PROT 7.9  ALBUMIN 3.7   No results for input(s): "LIPASE", "AMYLASE" in the last 168 hours. No results for input(s): "AMMONIA" in the last 168 hours. Coagulation Profile: No results for input(s): "INR", "PROTIME" in the last 168 hours. Cardiac Enzymes: No results for input(s): "CKTOTAL", "CKMB", "CKMBINDEX", "TROPONINI" in the last 168 hours. BNP (last 3 results) No results for input(s): "PROBNP" in the last 8760 hours. HbA1C: No results for input(s): "HGBA1C" in the last 72 hours. CBG: Recent Labs  Lab 12/29/22 1031  GLUCAP 68*   Lipid Profile: No results for input(s): "CHOL", "HDL", "LDLCALC", "TRIG", "CHOLHDL", "LDLDIRECT" in the last 72 hours. Thyroid Function Tests: No results for input(s): "TSH", "T4TOTAL", "FREET4", "T3FREE", "THYROIDAB" in the last 72 hours. Anemia Panel: No results for input(s): "VITAMINB12", "FOLATE", "FERRITIN", "TIBC", "IRON", "RETICCTPCT" in the last 72 hours. Urine analysis:     Component Value Date/Time   COLORURINE STRAW (A) 12/29/2022 1121   APPEARANCEUR CLEAR (A) 12/29/2022 1121   LABSPEC 1.015 12/29/2022 1121   PHURINE 5.0 12/29/2022 1121   GLUCOSEU >=500 (A) 12/29/2022 1121   HGBUR NEGATIVE 12/29/2022 1121   BILIRUBINUR NEGATIVE 12/29/2022 1121   KETONESUR NEGATIVE 12/29/2022 1121   PROTEINUR NEGATIVE 12/29/2022 1121   NITRITE NEGATIVE 12/29/2022 1121   LEUKOCYTESUR NEGATIVE 12/29/2022 1121    Radiological Exams on Admission: No results found.   Assessment/Plan Principal Problem:   Hypoglycemia Active Problems:   Type 1 diabetes mellitus (HCC)   History of GI bleed   Essential (primary) hypertension   CKD (chronic kidney disease) stage 3, GFR 30-59 ml/min (HCC)   Cocaine abuse (HCC)   History of CVA (cerebrovascular accident)   Anemia of chronic kidney failure, stage 3 (moderate) (HCC)   Benign prostatic hyperplasia with nocturia   History of multiple myeloma   Hypothermia   # Symptomatic hypoglycemia # T1DM Appears to be 2/2 giving too much long-acting insulin last night.  - continue d5 - SSI, hold home long-acting - DM consult  # Hypothermia Asymptomatic, probably 2/2 hypoglycemia, denies infectious symptoms - monitor - f/u blood cultures  # Multiple myeloma On darzalex. Neutropenia stable at 3 - outpt onc f/u - cont home acyclovir ppx  # CKD 3b Cr 2.16 is at baseline - monitor  # Cocaine abuse Says last used 3 days ago  # HTN Here bp mild elevation - home lisinopril, aspirin  # Hx CVA - home aspirin, statin    DVT prophylaxis: lobenox Code Status: full  Family Communication: none @ bedside  Consults called: none   Level of care: Med-Surg Status is: Observation The patient remains OBS appropriate and will d/c before 2 midnights.    Silvano Bilis MD [facility] Pager [phone]  If 7PM-7AM, please contact night-coverage [email or web address] Password [email or web address]  12/29/2022, 12:00 PM

## 2022-12-29 NOTE — Inpatient Diabetes Management (Addendum)
Inpatient Diabetes Program Recommendations  AACE/ADA: New Consensus Statement on Inpatient Glycemic Control (2015)  Target Ranges:  Prepandial:   less than 140 mg/dL      Peak postprandial:   less than 180 mg/dL (1-2 hours)      Critically ill patients:  140 - 180 mg/dL   Lab Results  Component Value Date   GLUCAP 159 (H) 12/29/2022   HGBA1C 9.0 (H) 11/14/2022    Review of Glycemic Control  Latest Reference Range & Units 12/29/22 10:31 12/29/22 12:23  Glucose-Capillary 70 - 99 mg/dL 68 (L) 865 (H)  (L): Data is abnormally low (H): Data is abnormally high  Diabetes history: DM1 Outpatient Diabetes medications: Tresiba 5 units every day, Humalog 0-9 units TID  Current orders for Inpatient glycemic control: CBGs   Spoke with patient over the phone as this DM coordinator is at [employer] today.  Asked Mr. Mcilhenny what happened.  He states his BS was 459 mg/dL last night so he administered 13 units of Humalog and went to bed.  He tells me Humalog is not as strong as Novolog; his insurance changed from Novolog to Humalog 2 months ago.  Explained that Humalog and Novolog are both Rapid acting insulins and should have the same impact on his blood glucose.  Asked him not to take more that 9 units of rapid insulin when his BG is elevated.    Asked if he has worn a CGM in the past.  He states he has used the Dexcom G7 but it falls off in the shower.  Gave him some ideas to help the CGM stay on and encouraged him to wear it as it has alarms and can help prevent hypoglycemia.  He says, "yeah, it is a good thing to have".  We will provide him with a sample Dexcom G7.  He states he has 1 left at home.  He is current with his PCP.   Will continue to follow while inpatient.  Thank you, Dulce Sellar, MSN, CDCES Diabetes Coordinator Inpatient Diabetes Program [phone] (team pager from 8a-5p)

## 2022-12-29 NOTE — ED Notes (Signed)
Gave 2 cups of apple juice

## 2022-12-29 NOTE — ED Triage Notes (Signed)
Pt arrived via [facility]. EMS found pt unresponsive and diaphoretic on arrival. Original blood sugar of 33. Given bag of D10. BS checked after and it was 210. ANO x4 on arrival.

## 2022-12-29 NOTE — ED Provider Notes (Signed)
[facility] Provider Note    Event Date/Time   First MD Initiated Contact with Patient 12/29/22 [id number]     (approximate)   History   Chief Complaint Hypoglycemia   HPI  Nicholas Weiss is a 72 y.o. male with past medical history of hypertension, hyperlipidemia, diabetes, CKD, and multiple myeloma who presents to the ED for hypoglycemia.  Patient reports that his blood sugar was greater than 400 last night and so he gave himself 14 units of NovoLog.  His girlfriend then reportedly found him minimally responsive this morning and so called EMS.  EMS reports an atrial blood glucose of 33, patient then given bolus of D10 with improvement to greater than 200.  He is now awake and alert, denies any complaints and states that he would like to go home and rest.  He denies any recent fevers, cough, chest pain, shortness of breath, nausea, vomiting, diarrhea, or dysuria.     Physical Exam   Triage Vital Signs: ED Triage Vitals  Encounter Vitals Group     BP      Systolic BP Percentile      Diastolic BP Percentile      Pulse      Resp      Temp      Temp src      SpO2      Weight      Height      Head Circumference      Peak Flow      Pain Score      Pain Loc      Pain Education      Exclude from Growth Chart     Most recent vital signs: Vitals:   12/29/22 1000 12/29/22 1026  BP: (!) 182/110 (!) 189/98  Pulse: 72   Resp: 15   Temp:  (!) 94.6 F (34.8 C)  SpO2: 100%     Constitutional: Alert and oriented. Eyes: Conjunctivae are normal. Head: Atraumatic. Nose: No congestion/rhinnorhea. Mouth/Throat: Mucous membranes are moist.  Cardiovascular: Normal rate, regular rhythm. Grossly normal heart sounds.  2+ radial pulses bilaterally. Respiratory: Normal respiratory effort.  No retractions. Lungs CTAB. Gastrointestinal: Soft and nontender. No distention. Musculoskeletal: No lower extremity tenderness nor edema.  Neurologic:  Normal speech and  language. No gross focal neurologic deficits are appreciated.    ED Results / Procedures / Treatments   Labs (all labs ordered are listed, but only abnormal results are displayed) Labs Reviewed  CBC WITH DIFFERENTIAL/PLATELET - Abnormal; Notable for the following components:      Result Value   WBC 3.0 (*)    RBC 3.21 (*)    Hemoglobin 9.7 (*)    HCT 30.6 (*)    Neutro Abs 0.6 (*)    All other components within normal limits  BASIC METABOLIC PANEL - Abnormal; Notable for the following components:   Glucose, Bld 122 (*)    BUN 60 (*)    Creatinine, Ser 2.16 (*)    Calcium 8.6 (*)    GFR, Estimated 32 (*)    All other components within normal limits  CBG MONITORING, ED - Abnormal; Notable for the following components:   Glucose-Capillary 68 (*)    All other components within normal limits  CULTURE, BLOOD (ROUTINE X 2)  CULTURE, BLOOD (ROUTINE X 2)  PATHOLOGIST SMEAR REVIEW  URINALYSIS, ROUTINE W REFLEX MICROSCOPIC     EKG  ED ECG REPORT I, Chesley Noon, the attending physician, personally viewed  and interpreted this ECG.   Date: 12/29/2022  EKG Time: 9:02  Rate: 79  Rhythm: normal sinus rhythm  Axis: Normal  Intervals:nonspecific intraventricular conduction delay  ST&T Change: None  PROCEDURES:  Critical Care performed: No  Procedures   MEDICATIONS ORDERED IN ED: Medications  dextrose 10 % and 0.45 % NaCl infusion ( Intravenous New Bag/Given 12/29/22 1131)  dextrose 50 % solution 50 mL (50 mLs Intravenous Given 12/29/22 1112)     IMPRESSION / MDM / ASSESSMENT AND PLAN / ED COURSE  I reviewed the triage vital signs and the nursing notes.                              72 y.o. male with past medical history of hypertension, hyperlipidemia, diabetes, CKD, and multiple myeloma who presents to the ED complaining of episode of hypoglycemia at home, improved following D10 by EMS.  Patient's presentation is most consistent with acute presentation with potential  threat to life or bodily function.  Differential diagnosis includes, but is not limited to, hypoglycemia, AKI, electrolyte abnormality, medication noncompliance, insulin overdose.  Patient nontoxic-appearing and in no acute distress, vital signs are remarkable for hypothermia but otherwise reassuring.  He denies any complaints and did not have a fall associated with the episode.  He did apparently lower himself to the ground beside his bed, found wearing only his underwear which I suspect resulted in his hypothermia.  Low suspicion for sepsis at this time, no symptoms to suggest infectious process.  Blood glucose improved with EMS, we will recheck here in the ED and screening labs as well as EKG.  Blood glucose downtrending here in the ED despite patient being given something to eat, has now dropped down to 68 and we will start on D10 drip.  Renal function stable compared to previous, no acute electrolyte abnormality, chronic anemia and leukopenia noted.  Patient on Bair hugger for hypothermia but suspicion remains low for sepsis.  Case discussed with hospitalist for admission.      FINAL CLINICAL IMPRESSION(S) / ED DIAGNOSES   Final diagnoses:  Hypoglycemia  Hypothermia, initial encounter     Rx / DC Orders   ED Discharge Orders     None        Note:  This document was prepared using Dragon voice recognition software and may include unintentional dictation errors.   Chesley Noon, MD 12/29/22 [phone]

## 2022-12-29 NOTE — ED Notes (Signed)
MD notified of elevated blood sugar

## 2022-12-30 DIAGNOSIS — E162 Hypoglycemia, unspecified: Secondary | ICD-10-CM

## 2022-12-30 DIAGNOSIS — E1021 Type 1 diabetes mellitus with diabetic nephropathy: Secondary | ICD-10-CM

## 2022-12-30 DIAGNOSIS — N1832 Chronic kidney disease, stage 3b: Secondary | ICD-10-CM | POA: Diagnosis not present

## 2022-12-30 LAB — BASIC METABOLIC PANEL
Anion gap: 5 (ref 5–15)
BUN: 59 mg/dL — ABNORMAL HIGH (ref 8–23)
CO2: 23 mmol/L (ref 22–32)
Calcium: 8.3 mg/dL — ABNORMAL LOW (ref 8.9–10.3)
Chloride: 106 mmol/L (ref 98–111)
Creatinine, Ser: 1.7 mg/dL — ABNORMAL HIGH (ref 0.61–1.24)
GFR, Estimated: 43 mL/min — ABNORMAL LOW (ref >60)
Glucose, Bld: 112 mg/dL — ABNORMAL HIGH (ref 70–99)
Potassium: 4.9 mmol/L (ref 3.5–5.1)
Sodium: 134 mmol/L — ABNORMAL LOW (ref 135–145)

## 2022-12-30 LAB — GLUCOSE, CAPILLARY
Glucose-Capillary: 193 mg/dL — ABNORMAL HIGH (ref 70–99)
Glucose-Capillary: 250 mg/dL — ABNORMAL HIGH (ref 70–99)

## 2022-12-30 LAB — CBC
HCT: 29.7 % — ABNORMAL LOW (ref 39.0–52.0)
Hemoglobin: 9.8 g/dL — ABNORMAL LOW (ref 13.0–17.0)
MCH: 30.3 pg (ref 26.0–34.0)
MCHC: 33 g/dL (ref 30.0–36.0)
MCV: 92 fL (ref 80.0–100.0)
Platelets: 280 10*3/uL (ref 150–400)
RBC: 3.23 MIL/uL — ABNORMAL LOW (ref 4.22–5.81)
RDW: 14.7 % (ref 11.5–15.5)
WBC: 2.7 10*3/uL — ABNORMAL LOW (ref 4.0–10.5)
nRBC: 0 % (ref 0.0–0.2)

## 2022-12-30 MED ORDER — INSULIN GLARGINE-YFGN 100 UNIT/ML ~~LOC~~ SOLN
5.0000 [IU] | Freq: Every day | SUBCUTANEOUS | Status: DC
  Filled 2022-12-30: qty 0.05

## 2022-12-30 MED ORDER — PANTOPRAZOLE SODIUM 40 MG PO TBEC
40.0000 mg | DELAYED_RELEASE_TABLET | Freq: Two times a day (BID) | ORAL | Status: DC
  Administered 2022-12-30: 40 mg via ORAL
  Filled 2022-12-30: qty 1

## 2022-12-30 NOTE — TOC Initial Note (Addendum)
Transition of Care [facility]) - Initial/Assessment Note    Patient Details  Name: Nicholas Weiss MRN: [id number] Date of Birth: July 01, 1950  Transition of Care [facility]) CM/SW Contact:    Darolyn Rua, LCSW Phone Number: 12/30/2022, 10:24 AM  Clinical Narrative:                  CSW spoke with patient at bedside to review home health PT and OT recommendations, he reports he is active with [facility], CSW has reached out to [place] with [facility] to inform. Patient reports he has a cane and walker at home that he uses, reports his wife will pick him up at time of discharge.   Patient reports PCP [facility] is [address]  No additional discharge needs.   Update 12:14: [facility] reports no staffing as they are booked out 2 weeks in [place], CSW has reached out to [facility] with [place], they accepted referral. Informed of dc today, HH orders are in.   Barriers to Discharge: Continued Medical Work up   Patient Goals and CMS Choice Patient states their goals for this hospitalization and ongoing recovery are:: to go home CMS Medicare.gov Compare Post Acute Care list provided to:: Patient Choice offered to / list presented to : Patient      Expected Discharge Plan and Services       Living arrangements for the past 2 months: Single Family Home                                      Prior Living Arrangements/Services Living arrangements for the past 2 months: Single Family Home Lives with:: Spouse                   Activities of Daily Living   ADL Screening (condition at time of admission) Independently performs ADLs?: Yes (appropriate for developmental age) Is the patient deaf or have difficulty hearing?: No Does the patient have difficulty seeing, even when wearing glasses/contacts?: No Does the patient have difficulty concentrating, remembering, or making decisions?: No  Permission Sought/Granted                  Emotional Assessment               Admission diagnosis:  Hypoglycemia [E16.2] Hypothermia, initial encounter [T68.XXXA] Patient Active Problem List   Diagnosis Date Noted   Hypothermia 12/29/2022   Dizziness 11/13/2022   Substance abuse (HCC) 09/18/2022   History of GI bleed 09/18/2022   H/O orthostatic hypotension 09/18/2022   Uncontrolled type 1 diabetes mellitus with hyperglycemia, with long-term current use of insulin (HCC) 08/26/2022   Postural dizziness with presyncope 08/18/2022   Near syncope 08/18/2022   Closed fracture of shaft of left humerus 06/18/2022   Malnutrition of moderate degree 06/10/2022   Hypotension 06/10/2022   GI bleed 06/06/2022   Subdural hematoma (HCC) 05/15/2022   Falls 05/15/2022   Shoulder pain, left 05/15/2022   History of amputation of left great toe (HCC) 05/04/2022   Hypophosphatemia 04/28/2022   Protein-calorie malnutrition, severe 04/24/2022   Diabetic ulcer of toe associated with diabetes mellitus due to underlying condition, with fat layer exposed (HCC) 04/24/2022   Streptococcal bacteremia 04/24/2022   Leukopenia 04/24/2022   History of multiple myeloma 04/24/2022   Sympathotonic orthostatic hypotension 04/24/2022   Weight loss 04/23/2022   Osteomyelitis of left foot (HCC) 04/22/2022   DKA, type  1 (HCC) 04/22/2022   Dehydration 04/08/2022   Diabetic ketoacidosis without coma associated with type 1 diabetes mellitus (HCC) 04/07/2022   Hyperkalemia 04/07/2022   DKA (diabetic ketoacidosis) (HCC) 04/07/2022   Traumatic pneumothorax 02/18/2022   Multiple fractures of ribs, left side, initial encounter for closed fracture 02/18/2022   Pneumothorax, closed, traumatic 02/18/2022   Benign prostatic hyperplasia with nocturia 02/12/2022   Stroke (cerebrum) (HCC) 11/07/2021   Multiple myeloma (HCC) 09/29/2021   Multiple myeloma not having achieved remission (HCC) 09/29/2021   Hyponatremia 08/18/2021   COVID-19 virus infection 08/13/2021   History of CVA  (cerebrovascular accident) 08/13/2021   Syncope and collapse, recurrent 08/09/2021   Hepatic lesion 08/05/2021   Dyslipidemia 05/08/2021   History of substance abuse (HCC) 12/09/2020   Acute metabolic encephalopathy 12/02/2020   Cocaine abuse (HCC) 11/30/2020   Symptomatic anemia 11/08/2020   Muscle twitching 09/12/2020   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   Pre-syncope 08/01/2020   Heart block AV first degree 08/01/2020   Anemia of chronic kidney failure, stage 3 (moderate) (HCC) 05/28/2020   PUD (peptic ulcer disease)    Esophageal dysphagia    Encounter for screening colonoscopy    Hyperglycemia 07/28/2019   Generalized weakness 07/28/2019   Hypoglycemia 04/27/2019   Unresponsiveness 04/27/2019   Lung nodule 04/07/2019   Type 1 diabetes mellitus (HCC)    Acute renal failure superimposed on stage 3a chronic kidney disease (HCC)    Hypertensive urgency 04/05/2019   AKI (acute kidney injury) (HCC) 04/05/2019   CKD (chronic kidney disease) stage 3, GFR 30-59 ml/min (HCC) 04/05/2019   Hypercholesterolemia 01/24/2016   Essential (primary) hypertension 06/07/2006   Type 1 diabetes mellitus with diabetic neuropathy, unspecified (HCC) 05/31/2006   PCP:  Marisue Ivan, MD Pharmacy:   [facility] [address]), [place] - [address] [place]) [place] [address] Phone: [phone] Fax: [phone]  [facility] - [place], [place] - [address] [place] [address] Phone: [phone] Fax: [phone]  [facility] - [place], [place] - [address] [place] [address] Phone: [phone] Fax: [phone]  [facility] [address] [place] [address] Phone: [phone] Fax: [phone]     Social Determinants of Health (SDOH) Social History: SDOH Screenings   Food Insecurity: No Food  Insecurity (12/29/2022)  Housing: Low Risk  (12/29/2022)  Transportation Needs: No Transportation Needs (12/29/2022)  Utilities: Not At Risk (12/29/2022)  Alcohol Screen: Low Risk  (10/02/2020)  Depression (PHQ2-9): Medium Risk (10/02/2020)  Financial Resource Strain: Low Risk  (08/05/2021)   Received from [facility]  Physical Activity: Insufficiently Active (01/01/2020)  Social Connections: Moderately Isolated (01/01/2020)  Stress: No Stress Concern Present (01/01/2020)  Tobacco Use: Low Risk  (12/25/2022)  Recent Concern: Tobacco Use - Medium Risk (10/16/2022)   Received from [facility]   SDOH Interventions:     Readmission Risk Interventions    08/24/2022   10:57 AM 05/20/2022   11:41 AM 03/12/2022   10:22 AM  Readmission Risk Prevention Plan  Transportation Screening Complete Complete Complete  Medication Review (RN Care Manager) Complete Complete Complete  PCP or Specialist appointment within 3-5 days of discharge Complete Complete Complete  HRI or Home Care Consult Complete Complete Complete  SW Recovery Care/Counseling Consult   Complete  Palliative Care Screening  Complete Not Applicable  Skilled Nursing Facility Complete Complete  Not Applicable

## 2022-12-30 NOTE — Evaluation (Signed)
Physical Therapy Evaluation Patient Details Name: Nicholas Weiss MRN: [id number] DOB: [birth year] Today's Date: 12/30/2022  History of Present Illness  Pt is a 72 y.o. male with medical history significant for ckd 3a, ongoing cocaine abuse, htn, cva, gi bleed, active multiple myeloma on darzalex, t1dm, that presented to ED due to minimally responsive episode, noted for hypoglycemia.   Clinical Impression  Patient A&Ox4, reported concerns about ongoing dizziness after ambulating "for a while". Unable to quantify how far he needs to walk in order to feel dizzy. Per pt at baseline he is independent, utilizes [facility] or RW if needed, lives with his girlfriend.   ModI for bed mobility and transfers with RW. He was able to ambulate >171ft without LOB, no unsteadiness noted, did need education on RW use (tended to lift instead of push). Able to return to sitting EOB, needs in reach. Overall the patient demonstrated near return to baseline function but would benefit from further skilled PT intervention to maximize safety, activity tolerance, and endurance.         If plan is discharge home, recommend the following: Assist for transportation   Can travel by private vehicle        Equipment Recommendations None recommended by PT  Recommendations for Other Services       Functional Status Assessment Patient has had a recent decline in their functional status and demonstrates the ability to make significant improvements in function in a reasonable and predictable amount of time.     Precautions / Restrictions Precautions Precautions: Fall Restrictions Weight Bearing Restrictions: No      Mobility  Bed Mobility Overal bed mobility: Modified Independent                  Transfers Overall transfer level: Modified independent Equipment used: Rolling walker (2 wheels)                    Ambulation/Gait Ambulation/Gait assistance: Modified independent (Device/Increase  time) Gait Distance (Feet): 150 Feet Assistive device: Rolling walker (2 wheels)         General Gait Details: no LOB  Stairs            Wheelchair Mobility     Tilt Bed    Modified Rankin (Stroke Patients Only)       Balance Overall balance assessment: No apparent balance deficits (not formally assessed) Sitting-balance support: Feet supported Sitting balance-Leahy Scale: Good       Standing balance-Leahy Scale: Good Standing balance comment: improved steadiness with RW, pt endorsed he can go without it                             Pertinent Vitals/Pain Pain Assessment Pain Assessment: No/denies pain    Home Living Family/patient expects to be discharged to:: Private residence Living Arrangements: Spouse/significant other;Children Available Help at Discharge: Family;Available 24 hours/day Type of Home: House Home Access: Stairs to enter Entrance Stairs-Rails: Can reach both Entrance Stairs-Number of Steps: 1 STE bil railing   Home Layout: One level Home Equipment: Agricultural consultant (2 wheels);Cane - quad;Shower seat;Grab bars - toilet;Grab bars - tub/shower Additional Comments: Pt reports he uses the cane the majority of the time and the RW only when he's feeling unsteady.    Prior Function Prior Level of Function : Independent/Modified Independent             Mobility Comments: Mod indep for ADL's ADLs Comments: Reports independent  with ADLs and IADLs     Extremity/Trunk Assessment   Upper Extremity Assessment Upper Extremity Assessment: Overall WFL for tasks assessed    Lower Extremity Assessment Lower Extremity Assessment: Overall WFL for tasks assessed    Cervical / Trunk Assessment Cervical / Trunk Assessment: Normal  Communication   Communication Communication: No apparent difficulties  Cognition Arousal: Alert Behavior During Therapy: WFL for tasks assessed/performed Overall Cognitive Status: Within Functional Limits  for tasks assessed                                          General Comments General comments (skin integrity, edema, etc.): Pt reports that he has had 12 falls since February.    Exercises     Assessment/Plan    PT Assessment Patient needs continued PT services  PT Problem List Decreased balance;Decreased mobility;Decreased activity tolerance       PT Treatment Interventions DME instruction;Neuromuscular re-education;Gait training;Stair training;Patient/family education;Functional mobility training;Therapeutic activities;Therapeutic exercise;Balance training    PT Goals (Current goals can be found in the Care Plan section)  Acute Rehab PT Goals Patient Stated Goal: to not be dizzy when he's walking around PT Goal Formulation: With patient Time For Goal Achievement: 01/13/23 Potential to Achieve Goals: Good    Frequency Min 1X/week     Co-evaluation               AM-PAC PT "6 Clicks" Mobility  Outcome Measure Help needed turning from your back to your side while in a flat bed without using bedrails?: None Help needed moving from lying on your back to sitting on the side of a flat bed without using bedrails?: None Help needed moving to and from a bed to a chair (including a wheelchair)?: None Help needed standing up from a chair using your arms (e.g., wheelchair or bedside chair)?: None Help needed to walk in hospital room?: None Help needed climbing 3-5 steps with a railing? : None 6 Click Score: 24    End of Session Equipment Utilized During Treatment: Gait belt Activity Tolerance: Patient tolerated treatment well Patient left: in bed;with call bell/phone within reach;with bed alarm set Nurse Communication: Mobility status PT Visit Diagnosis: Other abnormalities of gait and mobility (R26.89);Muscle weakness (generalized) (M62.81)    Time: 1610-9604 PT Time Calculation (min) (ACUTE ONLY): 20 min   Charges:   PT Evaluation $PT Eval Low  Complexity: 1 Low PT Treatments $Therapeutic Activity: 8-22 mins PT General Charges $$ ACUTE PT VISIT: 1 Visit        Olga Coaster PT, DPT 12:05 PM,12/30/22

## 2022-12-30 NOTE — Discharge Summary (Signed)
Physician Discharge Summary   Patient: Nicholas Weiss MRN: [id number] DOB: 1950/05/03  Admit date:     12/29/2022  Discharge date: 12/30/22  Discharge Physician: Marrion Coy   PCP: Marisue Ivan, MD   Recommendations at discharge:   Follow-up with PCP in 1 week.  Discharge Diagnoses: Principal Problem:   Hypoglycemia Active Problems:   Type 1 diabetes mellitus (HCC)   History of GI bleed   Essential (primary) hypertension   CKD (chronic kidney disease) stage 3, GFR 30-59 ml/min (HCC)   Cocaine abuse (HCC)   History of CVA (cerebrovascular accident)   Anemia of chronic kidney failure, stage 3 (moderate) (HCC)   Benign prostatic hyperplasia with nocturia   History of multiple myeloma   Hypothermia  Resolved Problems:   * No resolved hospital problems. *  Hospital Course: Nicholas Weiss is a 72 y.o. male with medical history significant for ckd 3a, ongoing cocaine abuse, htn, cva, gi bleed, active multiple myeloma on darzalex, t1dm, presenting with hypoglycemia.  Patient drinks sweet teas, glucose went up to 400, he received 40 units of Lantus instead of regular dose of 5 units.  Glucose dropped down to 33.  Upon arriving the hospital, patient was started on D10 drip.  Glucose getting better today.  At this point, he is no longer hypoglycemia, medically stable for discharge.  Assessment and Plan: # Symptomatic hypoglycemia # T1DM Condition has improved.  Resume home dose insulins.    # Hypothermia Asymptomatic, probably 2/2 hypoglycemia, denies infectious symptoms Culture negative.   # Multiple myeloma On darzalex. Neutropenia stable at 3 - outpt onc f/u - cont home acyclovir ppx   # CKD 3b Mild hyponatremia. Cr 2.16 is at baseline    # Cocaine abuse Says last used 3 days ago   # HTN Home dose medicines.   # Hx CVA - home aspirin, statin        Consultants: None Procedures performed: None  Disposition: Home Diet recommendation:  Discharge  Diet Orders (From admission, onward)     Start     Ordered   12/30/22 0000  Diet Carb Modified        12/30/22 1451           Carb modified diet DISCHARGE MEDICATION: Allergies as of 12/30/2022       Reactions   Penicillins Other (See Comments)   Childhood allergy -tolerated amoxil 03/2022 Unknown reaction Has patient had a PCN reaction causing immediate rash, facial/tongue/throat swelling, SOB or lightheadedness with hypotension: No Has patient had a PCN reaction causing severe rash involving mucus membranes or skin necrosis: No Has patient had a PCN reaction that required hospitalization No Has patient had a PCN reaction occurring within the last 10 years: No If all of the above answers are "NO", then may proceed with Cephalosporin use.        Medication List     TAKE these medications    acetaminophen 500 MG tablet Commonly known as: TYLENOL Take 1,000 mg by mouth daily as needed for mild pain or moderate pain.   acyclovir 400 MG tablet Commonly known as: ZOVIRAX Take 1 tablet (400 mg total) by mouth 2 (two) times daily.   ascorbic acid 500 MG tablet Commonly known as: VITAMIN C Take 1 tablet (500 mg total) by mouth daily.   aspirin EC 81 MG tablet Take 81 mg by mouth in the morning.   atorvastatin 80 MG tablet Commonly known as: LIPITOR Take 1 tablet (80 mg total)  by mouth at bedtime. Hold while taking Paxlovid   Baqsimi One Pack 3 MG/DOSE Powd Generic drug: Glucagon Place 3 mg into the nose once as needed for up to 1 dose (for low sugar).   docusate sodium 100 MG capsule Commonly known as: COLACE Take 100 mg by mouth daily as needed for mild constipation.   lisinopril 2.5 MG tablet Commonly known as: ZESTRIL Take by mouth.   multivitamin with minerals Tabs tablet Take 1 tablet by mouth daily.   NovoLOG 100 UNIT/ML injection Generic drug: insulin aspart Sliding Scale: Glucose 70 - 120: 0 units  Glucose 121 - 150: 1 unit  Glucose 151 - 200: 2  units Glucose 201 - 250: 3 units  Glucose 251 - 300: 5 units  Glucose 301 - 350: 7 units  Glucose 351 - 400: 9 units  Glucose > 400: call your docotr   ondansetron 8 MG tablet Commonly known as: ZOFRAN Take 8 mg by mouth daily as needed for nausea or vomiting.   pantoprazole 40 MG tablet Commonly known as: Protonix Take 1 tablet (40 mg total) by mouth 2 (two) times daily. Take twice daily for 2 months then decrease to once daily   pomalidomide 3 MG capsule Commonly known as: POMALYST Take 1 capsule (3 mg total) by mouth daily. Take for 21 days, then hold for 7 days. Repeat every 28 days   Tresiba FlexTouch 100 UNIT/ML FlexTouch Pen Generic drug: insulin degludec Inject 5 Units into the skin daily. Home med.        Follow-up Information     Marisue Ivan, MD Follow up in 1 week(s).   Specialty: Family Medicine Contact information: [address] [facility] [place] [address] [phone]                Discharge Exam: Ceasar Mons Weights   12/29/22 0848  Weight: 71.4 kg   General exam: Appears calm and comfortable  Respiratory system: Clear to auscultation. Respiratory effort normal. Cardiovascular system: S1 & S2 heard, RRR. No JVD, murmurs, rubs, gallops or clicks. No pedal edema. Gastrointestinal system: Abdomen is nondistended, soft and nontender. No organomegaly or masses felt. Normal bowel sounds heard. Central nervous system: Alert and oriented. No focal neurological deficits. Extremities: Symmetric 5 x 5 power. Skin: No rashes, lesions or ulcers Psychiatry: Judgement and insight appear normal. Mood & affect appropriate.    Condition at discharge: good  The results of significant diagnostics from this hospitalization (including imaging, microbiology, ancillary and laboratory) are listed below for reference.   Imaging Studies: No results found.  Microbiology: Results for orders placed or performed during the hospital encounter  of 12/29/22  Culture, blood (Routine X 2) w Reflex to ID Panel     Status: None (Preliminary result)   Collection Time: 12/29/22 12:08 PM   Specimen: BLOOD RIGHT ARM  Result Value Ref Range Status   Specimen Description BLOOD RIGHT ARM  Final   Special Requests   Final    BOTTLES DRAWN AEROBIC AND ANAEROBIC Blood Culture results may not be optimal due to an excessive volume of blood received in culture bottles   Culture   Final    NO GROWTH < 24 HOURS Performed at [facility] [address] [place], [place] [address]    Report Status PENDING  Incomplete  Culture, blood (Routine X 2) w Reflex to ID Panel     Status: None (Preliminary result)   Collection Time: 12/29/22 12:10 PM   Specimen:  BLOOD RIGHT ARM  Result Value Ref Range Status   Specimen Description BLOOD RIGHT ARM  Final   Special Requests   Final    BOTTLES DRAWN AEROBIC AND ANAEROBIC Blood Culture adequate volume   Culture   Final    NO GROWTH < 24 HOURS Performed at [facility], [address] [place], [place] [address]    Report Status PENDING  Incomplete    Labs: CBC: Recent Labs  Lab 12/25/22 1101 12/29/22 0856 12/30/22 0309  WBC 2.3* 3.0* 2.7*  NEUTROABS 0.5* 0.6*  --   HGB 10.4* 9.7* 9.8*  HCT 32.6* 30.6* 29.7*  MCV 94.8 95.3 92.0  PLT 296 313 280   Basic Metabolic Panel: Recent Labs  Lab 12/25/22 1101 12/29/22 0856 12/30/22 0309  NA 133* 135 134*  K 4.6 4.5 4.9  CL 102 105 106  CO2 22 22 23   GLUCOSE 238* 122* 112*  BUN 59* 60* 59*  CREATININE 1.90* 2.16* 1.70*  CALCIUM 8.9 8.6* 8.3*   Liver Function Tests: Recent Labs  Lab 12/25/22 1101  AST 34  ALT 29  ALKPHOS 54  BILITOT 0.5  PROT 7.9  ALBUMIN 3.7   CBG: Recent Labs  Lab 12/29/22 1540 12/29/22 1700 12/29/22 2101 12/30/22 0734 12/30/22 1121  GLUCAP 364* 358* 158* 193* 250*    Discharge time spent: greater than 30 minutes.  Signed: Marrion Coy, MD [facility] 12/30/2022

## 2022-12-30 NOTE — Progress Notes (Signed)
Initial Nutrition Assessment  DOCUMENTATION CODES:   Non-severe (moderate) malnutrition in context of chronic illness  INTERVENTION:   -Continue carb modified diet -Glucerna Shake po TID, each supplement provides 220 kcal and 10 grams of protein  -MVI with minerals daily  NUTRITION DIAGNOSIS:   Moderate Malnutrition related to chronic illness (multiple myeloma) as evidenced by mild fat depletion, moderate fat depletion, mild muscle depletion, moderate muscle depletion.  GOAL:   Patient will meet greater than or equal to 90% of their needs  MONITOR:   PO intake, Supplement acceptance  REASON FOR ASSESSMENT:   Malnutrition Screening Tool    ASSESSMENT:   Pt with medical history significant for ckd 3a, ongoing cocaine abuse, htn, cva, gi bleed, active multiple myeloma on darzalex, t1dm, presenting with hypoglycemia.  Pt admitted with hypoglycemia.   Reviewed I/O's: +421 ml x 24 hours  UOP: 300 ml x 24 hours  Spoke with pt at bedside, who was pleasant and in good spirits today. Pt very tangential at time of visit but very eager to engage RD in conversation. Pt reports feeling very hungry and has been eating well during hospitalization. Attempted to obtain diet recall, however, pt continued to speak about his dizzy spells. Pt reports he recently had his lt great toe amputated, but this has not affected his balance. Plan for home health services per TOC. Attempted to redirect, however, unsuccessful. Pt denies any difficulty tolerating foods on the menu.    Reviewed wt hx; wt has been stable over the past 3 months.   Discussed importance of good meal and supplement intake to promote healing.   Per MD, plan to discharge home today.   Lab Results  Component Value Date   HGBA1C 9.0 (H) 11/14/2022   PTA DM medications are . Case discussed with DM coordinator, pt ran out of CGM sensors; samples provided to him today.   Labs reviewed: Na: 134, CBGS: 158-358 (inpatient orders  for glycemic control are 0-5 units insulin aspart daily at bedtime, 0-9 units insulin aspart TID with meals, and 5 units insulin glargine-yfgn daily).    NUTRITION - FOCUSED PHYSICAL EXAM:  Flowsheet Row Most Recent Value  Orbital Region Mild depletion  Upper Arm Region Mild depletion  Thoracic and Lumbar Region Mild depletion  Buccal Region Mild depletion  Temple Region Mild depletion  Clavicle Bone Region Moderate depletion  Clavicle and Acromion Bone Region Moderate depletion  Scapular Bone Region Moderate depletion  Dorsal Hand Moderate depletion  Patellar Region Moderate depletion  Anterior Thigh Region Moderate depletion  Posterior Calf Region Moderate depletion  Edema (RD Assessment) None  Hair Reviewed  Eyes Reviewed  Mouth Reviewed  Skin Reviewed  Nails Reviewed       Diet Order:   Diet Order             Diet Carb Modified           Diet Carb Modified Fluid consistency: Thin; Room service appropriate? Yes  Diet effective now                   EDUCATION NEEDS:   Education needs have been addressed  Skin:  Skin Assessment: Reviewed RN Assessment  Last BM:  12/29/22  Height:   Ht Readings from Last 1 Encounters:  12/29/22 5\' 10"  (1.778 m)    Weight:   Wt Readings from Last 1 Encounters:  12/29/22 71.4 kg    Ideal Body Weight:  75.5 kg  BMI:  Body mass index is  22.59 kg/m.  Estimated Nutritional Needs:   Kcal:  2100-2300  Protein:  105-120 grams  Fluid:  > 2 L    Levada Schilling, RD, LDN, CDCES Registered Dietitian III Certified Diabetes Care and Education Specialist Please refer to [facility] for RD and/or RD on-call/weekend/after hours pager

## 2022-12-30 NOTE — Plan of Care (Signed)
Problem: Education: Goal: Understanding of cardiac disease, CV risk reduction, and recovery process will improve Outcome: Progressing Goal: Individualized Educational Video(s) Outcome: Progressing   Problem: Activity: Goal: Ability to tolerate increased activity will improve Outcome: Progressing   Problem: Cardiac: Goal: Ability to achieve and maintain adequate cardiovascular perfusion will improve Outcome: Progressing   Problem: Health Behavior/Discharge Planning: Goal: Ability to safely manage health-related needs after discharge will improve Outcome: Progressing   Problem: Education: Goal: Knowledge of risk factors and measures for prevention of condition will improve Outcome: Progressing   Problem: Coping: Goal: Psychosocial and spiritual needs will be supported Outcome: Progressing   Problem: Respiratory: Goal: Will maintain a patent airway Outcome: Progressing Goal: Complications related to the disease process, condition or treatment will be avoided or minimized Outcome: Progressing   Problem: Education: Goal: Ability to describe self-care measures that may prevent or decrease complications (Diabetes Survival Skills Education) will improve Outcome: Progressing Goal: Individualized Educational Video(s) Outcome: Progressing   Problem: Cardiac: Goal: Ability to maintain an adequate cardiac output will improve Outcome: Progressing   Problem: Health Behavior/Discharge Planning: Goal: Ability to identify and utilize available resources and services will improve Outcome: Progressing Goal: Ability to manage health-related needs will improve Outcome: Progressing   Problem: Fluid Volume: Goal: Ability to achieve a balanced intake and output will improve Outcome: Progressing   Problem: Metabolic: Goal: Ability to maintain appropriate glucose levels will improve Outcome: Progressing   Problem: Nutritional: Goal: Maintenance of adequate nutrition will  improve Outcome: Progressing Goal: Maintenance of adequate weight for body size and type will improve Outcome: Progressing   Problem: Respiratory: Goal: Will regain and/or maintain adequate ventilation Outcome: Progressing   Problem: Urinary Elimination: Goal: Ability to achieve and maintain adequate renal perfusion and functioning will improve Outcome: Progressing   Problem: Education: Goal: Knowledge of condition and prescribed therapy will improve Outcome: Progressing   Problem: Cardiac: Goal: Will achieve and/or maintain adequate cardiac output Outcome: Progressing   Problem: Physical Regulation: Goal: Complications related to the disease process, condition or treatment will be avoided or minimized Outcome: Progressing   Problem: Education: Goal: Knowledge of General Education information will improve Description: Including pain rating scale, medication(s)/side effects and non-pharmacologic comfort measures Outcome: Progressing   Problem: Health Behavior/Discharge Planning: Goal: Ability to manage health-related needs will improve Outcome: Progressing   Problem: Clinical Measurements: Goal: Ability to maintain clinical measurements within normal limits will improve Outcome: Progressing Goal: Will remain free from infection Outcome: Progressing Goal: Diagnostic test results will improve Outcome: Progressing Goal: Respiratory complications will improve Outcome: Progressing Goal: Cardiovascular complication will be avoided Outcome: Progressing   Problem: Activity: Goal: Risk for activity intolerance will decrease Outcome: Progressing   Problem: Nutrition: Goal: Adequate nutrition will be maintained Outcome: Progressing   Problem: Coping: Goal: Level of anxiety will decrease Outcome: Progressing   Problem: Elimination: Goal: Will not experience complications related to bowel motility Outcome: Progressing Goal: Will not experience complications related to  urinary retention Outcome: Progressing   Problem: Pain Managment: Goal: General experience of comfort will improve Outcome: Progressing   Problem: Safety: Goal: Ability to remain free from injury will improve Outcome: Progressing   Problem: Skin Integrity: Goal: Risk for impaired skin integrity will decrease Outcome: Progressing   Problem: Education: Goal: Ability to describe self-care measures that may prevent or decrease complications (Diabetes Survival Skills Education) will improve Outcome: Progressing Goal: Individualized Educational Video(s) Outcome: Progressing   Problem: Coping: Goal: Ability to adjust to condition or change in health will improve  Outcome: Progressing   Problem: Fluid Volume: Goal: Ability to maintain a balanced intake and output will improve Outcome: Progressing   Problem: Health Behavior/Discharge Planning: Goal: Ability to identify and utilize available resources and services will improve Outcome: Progressing Goal: Ability to manage health-related needs will improve Outcome: Progressing

## 2022-12-30 NOTE — Plan of Care (Signed)
Problem: Education: Goal: Understanding of cardiac disease, CV risk reduction, and recovery process will improve Outcome: Adequate for Discharge Goal: Individualized Educational Video(s) Outcome: Adequate for Discharge   Problem: Activity: Goal: Ability to tolerate increased activity will improve Outcome: Adequate for Discharge   Problem: Cardiac: Goal: Ability to achieve and maintain adequate cardiovascular perfusion will improve Outcome: Adequate for Discharge   Problem: Health Behavior/Discharge Planning: Goal: Ability to safely manage health-related needs after discharge will improve Outcome: Adequate for Discharge   Problem: Education: Goal: Knowledge of risk factors and measures for prevention of condition will improve Outcome: Adequate for Discharge   Problem: Coping: Goal: Psychosocial and spiritual needs will be supported Outcome: Adequate for Discharge   Problem: Respiratory: Goal: Will maintain a patent airway Outcome: Adequate for Discharge Goal: Complications related to the disease process, condition or treatment will be avoided or minimized Outcome: Adequate for Discharge   Problem: Education: Goal: Ability to describe self-care measures that may prevent or decrease complications (Diabetes Survival Skills Education) will improve Outcome: Adequate for Discharge Goal: Individualized Educational Video(s) Outcome: Adequate for Discharge   Problem: Cardiac: Goal: Ability to maintain an adequate cardiac output will improve Outcome: Adequate for Discharge   Problem: Health Behavior/Discharge Planning: Goal: Ability to identify and utilize available resources and services will improve Outcome: Adequate for Discharge Goal: Ability to manage health-related needs will improve Outcome: Adequate for Discharge   Problem: Fluid Volume: Goal: Ability to achieve a balanced intake and output will improve Outcome: Adequate for Discharge   Problem: Metabolic: Goal:  Ability to maintain appropriate glucose levels will improve Outcome: Adequate for Discharge   Problem: Nutritional: Goal: Maintenance of adequate nutrition will improve Outcome: Adequate for Discharge Goal: Maintenance of adequate weight for body size and type will improve Outcome: Adequate for Discharge   Problem: Respiratory: Goal: Will regain and/or maintain adequate ventilation Outcome: Adequate for Discharge   Problem: Urinary Elimination: Goal: Ability to achieve and maintain adequate renal perfusion and functioning will improve Outcome: Adequate for Discharge   Problem: Education: Goal: Knowledge of condition and prescribed therapy will improve Outcome: Adequate for Discharge   Problem: Cardiac: Goal: Will achieve and/or maintain adequate cardiac output Outcome: Adequate for Discharge   Problem: Physical Regulation: Goal: Complications related to the disease process, condition or treatment will be avoided or minimized Outcome: Adequate for Discharge   Problem: Education: Goal: Knowledge of General Education information will improve Description: Including pain rating scale, medication(s)/side effects and non-pharmacologic comfort measures Outcome: Adequate for Discharge   Problem: Health Behavior/Discharge Planning: Goal: Ability to manage health-related needs will improve Outcome: Adequate for Discharge   Problem: Clinical Measurements: Goal: Ability to maintain clinical measurements within normal limits will improve Outcome: Adequate for Discharge Goal: Will remain free from infection Outcome: Adequate for Discharge Goal: Diagnostic test results will improve Outcome: Adequate for Discharge Goal: Respiratory complications will improve Outcome: Adequate for Discharge Goal: Cardiovascular complication will be avoided Outcome: Adequate for Discharge   Problem: Activity: Goal: Risk for activity intolerance will decrease Outcome: Adequate for Discharge    Problem: Nutrition: Goal: Adequate nutrition will be maintained Outcome: Adequate for Discharge   Problem: Coping: Goal: Level of anxiety will decrease Outcome: Adequate for Discharge   Problem: Elimination: Goal: Will not experience complications related to bowel motility Outcome: Adequate for Discharge Goal: Will not experience complications related to urinary retention Outcome: Adequate for Discharge   Problem: Pain Managment: Goal: General experience of comfort will improve Outcome: Adequate for Discharge   Problem: Safety:  Goal: Ability to remain free from injury will improve Outcome: Adequate for Discharge   Problem: Skin Integrity: Goal: Risk for impaired skin integrity will decrease Outcome: Adequate for Discharge   Problem: Education: Goal: Ability to describe self-care measures that may prevent or decrease complications (Diabetes Survival Skills Education) will improve Outcome: Adequate for Discharge Goal: Individualized Educational Video(s) Outcome: Adequate for Discharge   Problem: Coping: Goal: Ability to adjust to condition or change in health will improve Outcome: Adequate for Discharge   Problem: Fluid Volume: Goal: Ability to maintain a balanced intake and output will improve Outcome: Adequate for Discharge   Problem: Health Behavior/Discharge Planning: Goal: Ability to identify and utilize available resources and services will improve Outcome: Adequate for Discharge Goal: Ability to manage health-related needs will improve Outcome: Adequate for Discharge   Problem: Metabolic: Goal: Ability to maintain appropriate glucose levels will improve Outcome: Adequate for Discharge   Problem: Nutritional: Goal: Maintenance of adequate nutrition will improve Outcome: Adequate for Discharge Goal: Progress toward achieving an optimal weight will improve Outcome: Adequate for Discharge   Problem: Skin Integrity: Goal: Risk for impaired skin integrity  will decrease Outcome: Adequate for Discharge   Problem: Tissue Perfusion: Goal: Adequacy of tissue perfusion will improve Outcome: Adequate for Discharge   Pt ao x4, respirations even and unlabored on RA, pt has all belongings. Pt has received all DC instructions. Pt being taken home by family. Pt taken down to lobby with staff

## 2022-12-30 NOTE — Evaluation (Signed)
Occupational Therapy Evaluation Patient Details Name: Nicholas Weiss MRN: [id number] DOB: 1950-08-17 Today's Date: 12/30/2022   History of Present Illness Nicholas Weiss is a 72 y.o. male with past medical history of hypertension, hyperlipidemia, diabetes, CKD, and multiple myeloma who presents to the ED for hypoglycemia.  Patient reports that his blood sugar was greater than 400 last night and so he gave himself 14 units of NovoLog.  His girlfriend then reportedly found him minimally responsive this morning and so called EMS.  EMS reports an atrial blood glucose of 33, patient then given bolus of D10 with improvement to greater than 200.  He is now awake and alert, denies any complaints and states that he would like to go home and rest.  He denies any recent fevers, cough, chest pain, shortness of breath, nausea, vomiting, diarrhea, or dysuria.   Clinical Impression   Pt was seen for OT evaluation this date. Prior to hospital admission, pt was Mod I with all ADL's and IAD's. Pt lives with his wife and his 35 yr old son. Pt presents to acute OT demonstrating impaired balance, decreased knowledge of AE use, and safety awareness. Upon entering room, pt in supine in bed. Pt reported having about 12 falls since February of this year and reported often feeling dizzy and unsteady when standing or walking. Pt completed Supine<>sit at EOB and sit<> stand independently. Pt ambulated from EOB to the bathroom with supervision. Pt completed toilet manipulation/hygiene with supervision. Pt educated and encouraged RW use for safety. Pt left in bed with call bell within reach. Do not anticipate the need for follow up OT services upon acute hospital DC.       If plan is discharge home, recommend the following:      Functional Status Assessment  Patient has had a recent decline in their functional status and demonstrates the ability to make significant improvements in function in a reasonable and predictable  amount of time.  Equipment Recommendations       Recommendations for Other Services       Precautions / Restrictions Precautions Precautions: None Restrictions Weight Bearing Restrictions: No      Mobility Bed Mobility Overal bed mobility: Independent                  Transfers Overall transfer level: Independent Equipment used: None                      Balance Overall balance assessment: History of Falls, Mild deficits observed, not formally tested                                         ADL either performed or assessed with clinical judgement   ADL Overall ADL's : Needs assistance/impaired                                       General ADL Comments: Supervision for all ADL tasks.     Vision         Perception         Praxis         Pertinent Vitals/Pain Pain Assessment Pain Assessment: No/denies pain     Extremity/Trunk Assessment Upper Extremity Assessment Upper Extremity Assessment: Overall WFL for tasks assessed   Lower Extremity Assessment  Lower Extremity Assessment: Overall WFL for tasks assessed   Cervical / Trunk Assessment Cervical / Trunk Assessment: Normal   Communication Communication Communication: No apparent difficulties   Cognition Arousal: Alert Behavior During Therapy: WFL for tasks assessed/performed Overall Cognitive Status: Within Functional Limits for tasks assessed                                       General Comments  Pt reports that he has had 12 falls since February.    Exercises     Shoulder Instructions      Home Living Family/patient expects to be discharged to:: Private residence Living Arrangements: Spouse/significant other;Children Available Help at Discharge: Family;Available 24 hours/day Type of Home: House Home Access: Stairs to enter [employer] of Steps: 1 STE bil railing Entrance Stairs-Rails: Can reach both Home  Layout: One level     Bathroom Shower/Tub: Producer, television/film/video: Standard     Home Equipment: Agricultural consultant (2 wheels);Cane - quad;Shower seat;Grab bars - toilet;Grab bars - tub/shower   Additional Comments: Pt reports he uses the cane the majority of the time and the RW only when he's feeling unsteady.      Prior Functioning/Environment Prior Level of Function : Independent/Modified Independent             Mobility Comments: Mod indep for ADL's ADLs Comments: Reports independent with ADLs and IADLs        OT Problem List: Decreased knowledge of use of DME or AE;Decreased safety awareness;Decreased activity tolerance;Impaired balance (sitting and/or standing)      OT Treatment/Interventions:      OT Goals(Current goals can be found in the care plan section) Acute Rehab OT Goals Patient Stated Goal: to go home. OT Goal Formulation: With patient Time For Goal Achievement: 01/13/23 Potential to Achieve Goals: Good ADL Goals Pt Will Perform Lower Body Dressing: with modified independence;with adaptive equipment;sit to/from stand Pt Will Transfer to Toilet: with modified independence;regular height toilet;grab bars;ambulating Pt Will Perform Tub/Shower Transfer: with modified independence;grab bars;shower seat;ambulating  OT Frequency:      Co-evaluation              AM-PAC OT "6 Clicks" Daily Activity     Outcome Measure Help from another person eating meals?: None Help from another person taking care of personal grooming?: None Help from another person toileting, which includes using toliet, bedpan, or urinal?: None Help from another person bathing (including washing, rinsing, drying)?: None Help from another person to put on and taking off regular upper body clothing?: None Help from another person to put on and taking off regular lower body clothing?: None 6 Click Score: 24   End of Session    Activity Tolerance: Patient tolerated treatment  well Patient left: in bed;with call bell/phone within reach  OT Visit Diagnosis: Unsteadiness on feet (R26.81);Dizziness and giddiness (R42);History of falling (Z91.81)                Time:  -    Charges:     Butch Penny, SOT

## 2023-01-01 ENCOUNTER — Other Ambulatory Visit: Payer: 59

## 2023-01-01 ENCOUNTER — Ambulatory Visit: Payer: 59

## 2023-01-01 ENCOUNTER — Inpatient Hospital Stay: Payer: 59 | Attending: Oncology

## 2023-01-01 DIAGNOSIS — E860 Dehydration: Secondary | ICD-10-CM | POA: Insufficient documentation

## 2023-01-01 DIAGNOSIS — Z79624 Long term (current) use of inhibitors of nucleotide synthesis: Secondary | ICD-10-CM | POA: Insufficient documentation

## 2023-01-01 DIAGNOSIS — Z8673 Personal history of transient ischemic attack (TIA), and cerebral infarction without residual deficits: Secondary | ICD-10-CM | POA: Insufficient documentation

## 2023-01-01 DIAGNOSIS — C9000 Multiple myeloma not having achieved remission: Secondary | ICD-10-CM | POA: Insufficient documentation

## 2023-01-01 DIAGNOSIS — E1165 Type 2 diabetes mellitus with hyperglycemia: Secondary | ICD-10-CM | POA: Insufficient documentation

## 2023-01-01 DIAGNOSIS — R7989 Other specified abnormal findings of blood chemistry: Secondary | ICD-10-CM | POA: Insufficient documentation

## 2023-01-01 DIAGNOSIS — Z794 Long term (current) use of insulin: Secondary | ICD-10-CM | POA: Insufficient documentation

## 2023-01-01 DIAGNOSIS — I10 Essential (primary) hypertension: Secondary | ICD-10-CM | POA: Insufficient documentation

## 2023-01-01 DIAGNOSIS — Z7961 Long term (current) use of immunomodulator: Secondary | ICD-10-CM | POA: Insufficient documentation

## 2023-01-01 DIAGNOSIS — Z79899 Other long term (current) drug therapy: Secondary | ICD-10-CM | POA: Insufficient documentation

## 2023-01-03 LAB — CULTURE, BLOOD (ROUTINE X 2)
Culture: NO GROWTH
Culture: NO GROWTH
Special Requests: ADEQUATE

## 2023-01-08 ENCOUNTER — Other Ambulatory Visit: Payer: Self-pay | Admitting: Oncology

## 2023-01-08 ENCOUNTER — Telehealth: Payer: Self-pay | Admitting: *Deleted

## 2023-01-08 ENCOUNTER — Other Ambulatory Visit: Payer: 59

## 2023-01-08 ENCOUNTER — Ambulatory Visit: Payer: 59

## 2023-01-08 DIAGNOSIS — C9000 Multiple myeloma not having achieved remission: Secondary | ICD-10-CM

## 2023-01-08 NOTE — Telephone Encounter (Signed)
Treatment will start on 10/18. He will start pomalyst on 10/18. I see him on 11/1

## 2023-01-08 NOTE — Telephone Encounter (Signed)
Dr. Smith Robert, I spoke to patient he is asking what day should he see you. He is out the hospital and he is not scheduled until 11/1 because he was told to not start the Pomalidomide until he sees you. He has an apt with Dr. Barbaraann Cao on 10/18 should we fit him in then to see you? Please advise

## 2023-01-08 NOTE — Telephone Encounter (Signed)
I called patient and advised him of doctor response. He repeated back to me

## 2023-01-08 NOTE — Telephone Encounter (Signed)
Patient called asking when he will start taking treatment again and  if he can speak with Dr Smith Robert. Please advise

## 2023-01-09 ENCOUNTER — Other Ambulatory Visit: Payer: Self-pay

## 2023-01-11 ENCOUNTER — Other Ambulatory Visit: Payer: Self-pay

## 2023-01-11 DIAGNOSIS — C9000 Multiple myeloma not having achieved remission: Secondary | ICD-10-CM

## 2023-01-11 MED ORDER — POMALIDOMIDE 3 MG PO CAPS
3.0000 mg | ORAL_CAPSULE | Freq: Every day | ORAL | 0 refills | Status: DC
Start: 2023-01-11 — End: 2023-01-15

## 2023-01-12 ENCOUNTER — Other Ambulatory Visit: Payer: Self-pay | Admitting: Oncology

## 2023-01-12 ENCOUNTER — Telehealth: Payer: Self-pay | Admitting: *Deleted

## 2023-01-12 DIAGNOSIS — C9000 Multiple myeloma not having achieved remission: Secondary | ICD-10-CM

## 2023-01-12 NOTE — Telephone Encounter (Signed)
Opened in error. Margit Hanks RN

## 2023-01-15 ENCOUNTER — Inpatient Hospital Stay: Payer: 59

## 2023-01-15 ENCOUNTER — Encounter: Payer: Self-pay | Admitting: Oncology

## 2023-01-15 ENCOUNTER — Inpatient Hospital Stay (HOSPITAL_BASED_OUTPATIENT_CLINIC_OR_DEPARTMENT_OTHER): Payer: 59 | Admitting: Internal Medicine

## 2023-01-15 ENCOUNTER — Encounter: Payer: Self-pay | Admitting: Internal Medicine

## 2023-01-15 ENCOUNTER — Ambulatory Visit: Payer: 59

## 2023-01-15 ENCOUNTER — Other Ambulatory Visit: Payer: Self-pay

## 2023-01-15 VITALS — BP 141/86 | HR 75 | Resp 18 | Ht 70.0 in | Wt 158.0 lb

## 2023-01-15 DIAGNOSIS — Z79624 Long term (current) use of inhibitors of nucleotide synthesis: Secondary | ICD-10-CM | POA: Diagnosis not present

## 2023-01-15 DIAGNOSIS — G253 Myoclonus: Secondary | ICD-10-CM | POA: Diagnosis not present

## 2023-01-15 DIAGNOSIS — Z7961 Long term (current) use of immunomodulator: Secondary | ICD-10-CM | POA: Diagnosis not present

## 2023-01-15 DIAGNOSIS — Z8673 Personal history of transient ischemic attack (TIA), and cerebral infarction without residual deficits: Secondary | ICD-10-CM | POA: Diagnosis not present

## 2023-01-15 DIAGNOSIS — C9000 Multiple myeloma not having achieved remission: Secondary | ICD-10-CM

## 2023-01-15 DIAGNOSIS — R7989 Other specified abnormal findings of blood chemistry: Secondary | ICD-10-CM | POA: Diagnosis not present

## 2023-01-15 DIAGNOSIS — E860 Dehydration: Secondary | ICD-10-CM | POA: Diagnosis not present

## 2023-01-15 DIAGNOSIS — E1165 Type 2 diabetes mellitus with hyperglycemia: Secondary | ICD-10-CM | POA: Diagnosis not present

## 2023-01-15 DIAGNOSIS — I10 Essential (primary) hypertension: Secondary | ICD-10-CM | POA: Diagnosis not present

## 2023-01-15 DIAGNOSIS — Z794 Long term (current) use of insulin: Secondary | ICD-10-CM | POA: Diagnosis not present

## 2023-01-15 DIAGNOSIS — Z79899 Other long term (current) drug therapy: Secondary | ICD-10-CM | POA: Diagnosis not present

## 2023-01-15 MED ORDER — LEVETIRACETAM 500 MG PO TABS: 500.0000 mg | ORAL_TABLET | Freq: Two times a day (BID) | ORAL | 2 refills | Status: DC

## 2023-01-15 MED ORDER — POMALIDOMIDE 3 MG PO CAPS: 3.0000 mg | ORAL_CAPSULE | Freq: Every day | ORAL | 0 refills | Status: DC

## 2023-01-15 NOTE — Progress Notes (Unsigned)
[facility] at [facility] [address]  [place], [place] [address] [phone]   Interval Evaluation  Date of Service: 01/15/23 Patient Name: Nicholas Weiss Patient MRN: [id number] Patient DOB: 30-May-1950 Provider: Henreitta Leber, MD  Identifying Statement:  Nicholas Weiss is a 72 y.o. male with History of CVA (cerebrovascular accident)  Myoclonus   Primary Cancer:  Oncologic History: Oncology History  Multiple myeloma (HCC)  09/29/2021 Initial Diagnosis   Multiple myeloma (HCC)   09/29/2021 Cancer Staging   Staging form: Plasma Cell Myeloma and Plasma Cell Disorders, AJCC 8th Edition - Clinical stage from 09/29/2021: No stage assigned - Signed by Creig Hines, MD on 09/29/2021 Stage prefix: Initial diagnosis   10/17/2021 Cancer Staging   Staging form: Plasma Cell Myeloma and Plasma Cell Disorders, AJCC 8th Edition - Clinical stage from 10/17/2021: RISS Stage III (Beta-2-microglobulin (mg/L): 5.8, Albumin (g/dL): 3.3, ISS: Stage III, High-risk cytogenetics: Present, LDH: Normal) - Signed by Creig Hines, MD on 10/17/2021 Stage prefix: Initial diagnosis Beta 2 microglobulin range (mg/L): Greater than or equal to 5.5 Albumin range (g/dL): Less than 3.5 Cytogenetics: 1q addition, t(4;14) translocation Serum calcium level: Normal Serum creatinine level: Elevated   Multiple myeloma not having achieved remission (HCC)  09/29/2021 Initial Diagnosis   Multiple myeloma not having achieved remission (HCC)   10/03/2021 - 11/14/2021 Chemotherapy   Patient is on Treatment Plan : MYELOMA NON-TRANSPLANT CANDIDATES VRd weekly q21d     11/21/2021 - 04/03/2022 Chemotherapy   Patient is on Treatment Plan : MYELOMA NON-TRANSPLANT CANDIDATES VRd weekly q21d     08/06/2022 -  Chemotherapy   Patient is on Treatment Plan : MYELOMA NEWLY DIAGNOSED TRANSPLANT CANDIDATE DaraVRd (Daratumumab SQ) q21d x 6 Cycles (Induction/Consolidation)       Interval History: Nicholas Weiss presents today for follow up. He describes ongoing episodes of jerking in his arms or hands.  As prior, this is typically ass'd with low blood sugar.  Recently hospitalized for DKA.  Continues on daratumumab, pomalyst, for myeloma.    H+P Patient presents today for follow up after recent stroke admission.  On May 13th of this year, he experienced impairment or loss of consciousness, fall at home.  He returned to normal within minutes, no residual weakness or numbness.  CNS imaging demonstrated left subcortical infarct.  Following complete workup, he was discharged to home.  Currently he denies any focal symptoms, but does describe twitching or flinging of his hands, arms, shoulders at times.  This has been ongoing for more than a year.    Medications: Current Outpatient Medications on File Prior to Visit  Medication Sig Dispense Refill   acetaminophen (TYLENOL) 500 MG tablet Take 1,000 mg by mouth daily as needed for mild pain or moderate pain.     acyclovir (ZOVIRAX) 400 MG tablet Take 1 tablet (400 mg total) by mouth 2 (two) times daily. 60 tablet 2   ascorbic acid (VITAMIN C) 500 MG tablet Take 1 tablet (500 mg total) by mouth daily. 90 tablet 0   aspirin EC 81 MG tablet Take 81 mg by mouth in the morning.     atorvastatin (LIPITOR) 80 MG tablet Take 1 tablet (80 mg total) by mouth at bedtime. Hold while taking Paxlovid (Patient not taking: Reported on 12/29/2022) 30 tablet 2   docusate sodium (COLACE) 100 MG capsule Take 100 mg by mouth daily as needed for mild constipation.     Glucagon (BAQSIMI ONE PACK) 3  MG/DOSE POWD Place 3 mg into the nose once as needed for up to 1 dose (for low sugar). 1 each 1   insulin degludec (TRESIBA FLEXTOUCH) 100 UNIT/ML FlexTouch Pen Inject 5 Units into the skin daily. Home med.     lisinopril (ZESTRIL) 2.5 MG tablet Take by mouth.     Multiple Vitamin (MULTIVITAMIN WITH MINERALS) TABS tablet Take 1 tablet by mouth daily. 90 tablet 1   NOVOLOG 100 UNIT/ML  injection Sliding Scale: Glucose 70 - 120: 0 units  Glucose 121 - 150: 1 unit  Glucose 151 - 200: 2 units Glucose 201 - 250: 3 units  Glucose 251 - 300: 5 units  Glucose 301 - 350: 7 units  Glucose 351 - 400: 9 units  Glucose > 400: call your docotr 10 mL 1   ondansetron (ZOFRAN) 8 MG tablet Take 8 mg by mouth daily as needed for nausea or vomiting.     pantoprazole (PROTONIX) 40 MG tablet Take 1 tablet (40 mg total) by mouth 2 (two) times daily. Take twice daily for 2 months then decrease to once daily     No current facility-administered medications on file prior to visit.    Allergies:  Allergies  Allergen Reactions   Penicillins Other (See Comments)    Childhood allergy -tolerated amoxil 03/2022 Unknown reaction Has patient had a PCN reaction causing immediate rash, facial/tongue/throat swelling, SOB or lightheadedness with hypotension: No Has patient had a PCN reaction causing severe rash involving mucus membranes or skin necrosis: No Has patient had a PCN reaction that required hospitalization No Has patient had a PCN reaction occurring within the last 10 years: No If all of the above answers are "NO", then may proceed with Cephalosporin use.    Past Medical History:  Past Medical History:  Diagnosis Date   Acute metabolic encephalopathy 12/02/2020   DKA (diabetic ketoacidoses) 04/06/2016   Hypercholesteremia    Hypertension    Past Surgical History:  Past Surgical History:  Procedure Laterality Date   AMPUTATION TOE Left 04/25/2022   Procedure: LEFT GREAT TOE AMPUTATION AND LEFT PARTIAL 2ND TOE AMPUTATION;  Surgeon: Felecia Shelling, DPM;  Location: [facility] ORS;  Service: Podiatry;  Laterality: Left;   COLONOSCOPY WITH PROPOFOL N/A 02/01/2020   Procedure: COLONOSCOPY WITH PROPOFOL;  Surgeon: Toney Reil, MD;  Location: [facility] ENDOSCOPY;  Service: Gastroenterology;  Laterality: N/A;   ESOPHAGOGASTRODUODENOSCOPY  02/01/2020   Procedure: ESOPHAGOGASTRODUODENOSCOPY (EGD);   Surgeon: Toney Reil, MD;  Location: [facility] ENDOSCOPY;  Service: Gastroenterology;;   ESOPHAGOGASTRODUODENOSCOPY (EGD) WITH PROPOFOL N/A 06/07/2022   Procedure: ESOPHAGOGASTRODUODENOSCOPY (EGD) WITH PROPOFOL;  Surgeon: Kerin Salen, MD;  Location: [facility] ENDOSCOPY;  Service: Gastroenterology;  Laterality: N/A;   KNEE SURGERY Right    Torn meniscus   KNEE SURGERY Left    Social History:  Social History   Socioeconomic History   Marital status: Single    Spouse name: Not on file   Number of children: 3   Years of education: Not on file   Highest education level: High school graduate  Occupational History    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  Tobacco Use   Smoking status: Never   Smokeless tobacco: Never  Vaping Use   Vaping status: Never Used  Substance and Sexual Activity   Alcohol use: Not Currently    Alcohol/week: 0.0 - 1.0 standard drinks of alcohol    Comment: "once every 2 months"   Drug use: Yes    Types: Marijuana, "Crack" cocaine  Comment: last week   Sexual activity: Not Currently    Birth control/protection: None  Other Topics Concern   Not on file  Social History Narrative   Lives with girlfriend "sharon"   Social Determinants of Health   Financial Resource Strain: Low Risk  (08/05/2021)   [facility]   Overall Financial Resource Strain (CARDIA)    Difficulty of Paying Living Expenses: Not hard at all  Food Insecurity: No Food Insecurity (12/29/2022)   Hunger Vital Sign    Worried About Running Out of Food in the Last Year: Never true    Ran Out of Food in the Last Year: Never true  Transportation Needs: No Transportation Needs (12/29/2022)   PRAPARE - Administrator, Civil Service (Medical): No    Lack of Transportation (Non-Medical): No  Physical Activity: Insufficiently Active (01/01/2020)   Exercise Vital Sign    Days of Exercise per Week: 7 days    Minutes of Exercise per Session: 20 min  Stress: No Stress  Concern Present (01/01/2020)   [employer] of Occupational Health - Occupational Stress Questionnaire    Feeling of Stress : Not at all  Social Connections: Moderately Isolated (01/01/2020)   Social Connection and Isolation Panel [NHANES]    Frequency of Communication with Friends and Family: More than three times a week    Frequency of Social Gatherings with Friends and Family: More than three times a week    Attends Religious Services: Never    Database administrator or Organizations: No    Attends Banker Meetings: Never    Marital Status: Living with partner  Intimate Partner Violence: Not At Risk (12/29/2022)   Humiliation, Afraid, Rape, and Kick questionnaire    Fear of Current or Ex-Partner: No    Emotionally Abused: No    Physically Abused: No    Sexually Abused: No   Family History:  Family History  Problem Relation Age of Onset   Heart attack Father    Hypertension Sister    Cancer Sister     Review of Systems: Constitutional: Doesn't report fevers, chills or abnormal weight loss Eyes: Doesn't report blurriness of vision Ears, nose, mouth, throat, and face: Doesn't report sore throat Respiratory: Doesn't report cough, dyspnea or wheezes Cardiovascular: Doesn't report palpitation, chest discomfort  Gastrointestinal:  Doesn't report nausea, constipation, diarrhea GU: Doesn't report incontinence Skin: Doesn't report skin rashes Neurological: Per HPI Musculoskeletal: Doesn't report joint pain Behavioral/Psych: Doesn't report anxiety  Physical Exam:    12/30/2022    2:54 PM 12/30/2022    7:33 AM 12/30/2022    4:02 AM  Vitals with BMI  Systolic 114 114 621  Diastolic 76 68 69  Pulse 81 83 70    KPS: 90. General: Alert, cooperative, pleasant, in no acute distress Head: Normal EENT: No conjunctival injection or scleral icterus.  Lungs: Resp effort normal Cardiac: Regular rate Abdomen: Non-distended abdomen Skin: No rashes cyanosis or  petechiae. Extremities: No clubbing or edema  Neurologic Exam: Mental Status: Awake, alert, attentive to examiner. Oriented to self and environment. Language is fluent with intact comprehension.  Cranial Nerves: Visual acuity is grossly normal. Visual fields are full. Extra-ocular movements intact. No ptosis. Face is symmetric Motor: Tone and bulk are normal. Power is full in both arms and legs.  Mild asterixis with posturing. Reflexes are symmetric, no pathologic reflexes present.  Sensory: Intact to light touch Gait: Normal.   Labs: I have  reviewed the data as listed    Component Value Date/Time   NA 134 (L) 12/30/2022 0309   NA 135 10/03/2020 1317   NA 137 10/11/2012 0542   K 4.9 12/30/2022 0309   K 4.2 10/11/2012 0542   CL 106 12/30/2022 0309   CL 105 10/11/2012 0542   CO2 23 12/30/2022 0309   CO2 26 10/11/2012 0542   GLUCOSE 112 (H) 12/30/2022 0309   GLUCOSE 239 (H) 10/11/2012 0542   BUN 59 (H) 12/30/2022 0309   BUN 24 10/03/2020 1317   BUN 17 10/11/2012 0542   CREATININE 1.70 (H) 12/30/2022 0309   CREATININE 2.08 (H) 11/13/2022 1228   CREATININE 1.36 (H) 10/11/2012 0542   CALCIUM 8.3 (L) 12/30/2022 0309   CALCIUM 8.7 10/11/2012 0542   PROT 7.9 12/25/2022 1101   PROT 9.1 (H) 10/03/2020 1317   ALBUMIN 3.7 12/25/2022 1101   ALBUMIN 3.3 (L) 10/03/2020 1317   AST 34 12/25/2022 1101   AST 35 11/13/2022 1228   ALT 29 12/25/2022 1101   ALT 54 (H) 11/13/2022 1228   ALKPHOS 54 12/25/2022 1101   BILITOT 0.5 12/25/2022 1101   BILITOT 0.2 (L) 11/13/2022 1228   GFRNONAA 43 (L) 12/30/2022 0309   GFRNONAA 34 (L) 11/13/2022 1228   GFRNONAA 57 (L) 10/11/2012 0542   GFRAA 54 (L) 03/15/2020 1056   GFRAA >60 10/11/2012 0542   Lab Results  Component Value Date   WBC 2.7 (L) 12/30/2022   NEUTROABS 0.6 (L) 12/29/2022   HGB 9.8 (L) 12/30/2022   HCT 29.7 (L) 12/30/2022   MCV 92.0 12/30/2022   PLT 280 12/30/2022     Assessment/Plan History of CVA (cerebrovascular  accident)  Nicholas Weiss is clinically stable today.  We recommended he continue high dose statin and 81mg  aspirin.  Hypertension regimen managed by PCP.  We counseled further today on secondary stroke prevention, diet and exercise in particular.  His involuntary movements are most consistent with asterixis and myoclonus.  These are improved today, and we agree they could be provoked by hypoglyemia.  He will continue to keep a close watch on his sugars at home.  We appreciate the opportunity to participate in the care of Nicholas Weiss.   We ask that Nicholas Weiss return to clinic as needed.  All questions were answered. The patient knows to call the clinic with any problems, questions or concerns. No barriers to learning were detected.  The total time spent in the encounter was 30 minutes and more than 50% was on counseling and review of test results   Henreitta Leber, MD Medical Director of Neuro-Oncology [facility] 01/15/23 10:30 AM

## 2023-01-18 ENCOUNTER — Encounter: Payer: Self-pay | Admitting: Oncology

## 2023-01-21 ENCOUNTER — Other Ambulatory Visit: Payer: Self-pay | Admitting: *Deleted

## 2023-01-21 DIAGNOSIS — C9000 Multiple myeloma not having achieved remission: Secondary | ICD-10-CM

## 2023-01-22 ENCOUNTER — Inpatient Hospital Stay (HOSPITAL_BASED_OUTPATIENT_CLINIC_OR_DEPARTMENT_OTHER): Payer: 59 | Admitting: Hospice and Palliative Medicine

## 2023-01-22 ENCOUNTER — Inpatient Hospital Stay: Payer: 59

## 2023-01-22 ENCOUNTER — Encounter: Payer: Self-pay | Admitting: Hospice and Palliative Medicine

## 2023-01-22 VITALS — BP 110/77 | HR 76 | Temp 97.7°F | Resp 18 | Wt 141.6 lb

## 2023-01-22 VITALS — BP 108/78 | HR 75

## 2023-01-22 DIAGNOSIS — C9000 Multiple myeloma not having achieved remission: Secondary | ICD-10-CM

## 2023-01-22 DIAGNOSIS — R531 Weakness: Secondary | ICD-10-CM | POA: Diagnosis not present

## 2023-01-22 DIAGNOSIS — I959 Hypotension, unspecified: Secondary | ICD-10-CM

## 2023-01-22 LAB — COMPREHENSIVE METABOLIC PANEL
ALT: 22 U/L (ref 0–44)
AST: 21 U/L (ref 15–41)
Albumin: 3.8 g/dL (ref 3.5–5.0)
Alkaline Phosphatase: 56 U/L (ref 38–126)
Anion gap: 9 (ref 5–15)
BUN: 50 mg/dL — ABNORMAL HIGH (ref 8–23)
CO2: 23 mmol/L (ref 22–32)
Calcium: 9 mg/dL (ref 8.9–10.3)
Chloride: 99 mmol/L (ref 98–111)
Creatinine, Ser: 2.41 mg/dL — ABNORMAL HIGH (ref 0.61–1.24)
GFR, Estimated: 28 mL/min — ABNORMAL LOW (ref >60)
Glucose, Bld: 360 mg/dL — ABNORMAL HIGH (ref 70–99)
Potassium: 4.5 mmol/L (ref 3.5–5.1)
Sodium: 131 mmol/L — ABNORMAL LOW (ref 135–145)
Total Bilirubin: 0.6 mg/dL (ref 0.3–1.2)
Total Protein: 8.6 g/dL — ABNORMAL HIGH (ref 6.5–8.1)

## 2023-01-22 LAB — CBC WITH DIFFERENTIAL/PLATELET
Abs Immature Granulocytes: 0 10*3/uL (ref 0.00–0.07)
Basophils Absolute: 0 10*3/uL (ref 0.0–0.1)
Basophils Relative: 0 %
Eosinophils Absolute: 0 10*3/uL (ref 0.0–0.5)
Eosinophils Relative: 1 %
HCT: 33.9 % — ABNORMAL LOW (ref 39.0–52.0)
Hemoglobin: 11 g/dL — ABNORMAL LOW (ref 13.0–17.0)
Immature Granulocytes: 0 %
Lymphocytes Relative: 46 %
Lymphs Abs: 0.8 10*3/uL (ref 0.7–4.0)
MCH: 30.1 pg (ref 26.0–34.0)
MCHC: 32.4 g/dL (ref 30.0–36.0)
MCV: 92.6 fL (ref 80.0–100.0)
Monocytes Absolute: 0.1 10*3/uL (ref 0.1–1.0)
Monocytes Relative: 5 %
Neutro Abs: 0.8 10*3/uL — ABNORMAL LOW (ref 1.7–7.7)
Neutrophils Relative %: 48 %
Platelets: 252 10*3/uL (ref 150–400)
RBC: 3.66 MIL/uL — ABNORMAL LOW (ref 4.22–5.81)
RDW: 14.8 % (ref 11.5–15.5)
WBC: 1.7 10*3/uL — ABNORMAL LOW (ref 4.0–10.5)
nRBC: 0 % (ref 0.0–0.2)

## 2023-01-22 MED ORDER — SODIUM CHLORIDE 0.9 % IV SOLN
Freq: Once | INTRAVENOUS | Status: AC
  Filled 2023-01-22: qty 250

## 2023-01-22 NOTE — Progress Notes (Signed)
Pt admits to feeling better. Has been reclined in recliner. IV fluids infusing. Denies any pain.

## 2023-01-22 NOTE — Progress Notes (Signed)
Patient came in for lab only visit and while at registration started explaining how he has ben weak, no appetite, exteremly fatigued. Patient states "I just have not been feeling good." Patient states he has been very short of breathe. He is unable to walk short distances. Patient states "Nicholas Weiss never been like this before."

## 2023-01-22 NOTE — Progress Notes (Signed)
Symptom Management Clinic [facility] at [facility] [phone] [phone]  Patient Care Team: Marisue Ivan, MD as PCP - General (Family Medicine) Pa, [facility] (Optometry) Creig Hines, MD as Consulting Physician (Oncology)   NAME OF PATIENT: Nicholas Weiss  [id number]  22-Mar-1951   DATE OF VISIT: 01/22/23  REASON FOR CONSULT: Nicholas Weiss is a 72 y.o. male with multiple medical problems including CKD stage IIIa, cocaine abuse, history of CVA, GI bleed, insulin-dependent diabetes, and multiple myeloma on Darzalex.   INTERVAL HISTORY: Patient recently hospitalized 12/29/2022 to 12/30/2022 with hypoglycemia after taking too much insulin.  Patient presents to clinic today for routine labs but was feeling weak and initially noted to be hypotensive so was an add-on to [facility] for evaluation.  Patient initially hypotensive and hypothermic.  However, upon getting patient settled into her room and rechecking vitals, he was found to be normotensive also with normal temperature.  Patient states that he has had poor oral intake recently and overall just feels weak.  Denies fever or chills.  Says he has chronic shortness of breath with exertion but denies it changed in characteristic or severity.  Denies wheezing.  No chest pain.  No cough or congestion.  Denies GI or GU symptoms. Patient offers no further specific complaints today.   PAST MEDICAL HISTORY: Past Medical History:  Diagnosis Date   Acute metabolic encephalopathy 12/02/2020   DKA (diabetic ketoacidoses) 04/06/2016   Hypercholesteremia    Hypertension     PAST SURGICAL HISTORY:  Past Surgical History:  Procedure Laterality Date   AMPUTATION TOE Left 04/25/2022   Procedure: LEFT GREAT TOE AMPUTATION AND LEFT PARTIAL 2ND TOE AMPUTATION;  Surgeon: Felecia Shelling, DPM;  Location: [facility] ORS;  Service: Podiatry;  Laterality: Left;   COLONOSCOPY WITH PROPOFOL N/A 02/01/2020    Procedure: COLONOSCOPY WITH PROPOFOL;  Surgeon: Toney Reil, MD;  Location: [facility] ENDOSCOPY;  Service: Gastroenterology;  Laterality: N/A;   ESOPHAGOGASTRODUODENOSCOPY  02/01/2020   Procedure: ESOPHAGOGASTRODUODENOSCOPY (EGD);  Surgeon: Toney Reil, MD;  Location: [facility] ENDOSCOPY;  Service: Gastroenterology;;   ESOPHAGOGASTRODUODENOSCOPY (EGD) WITH PROPOFOL N/A 06/07/2022   Procedure: ESOPHAGOGASTRODUODENOSCOPY (EGD) WITH PROPOFOL;  Surgeon: Kerin Salen, MD;  Location: [facility] ENDOSCOPY;  Service: Gastroenterology;  Laterality: N/A;   KNEE SURGERY Right    Torn meniscus   KNEE SURGERY Left     HEMATOLOGY/ONCOLOGY HISTORY:  Oncology History  Multiple myeloma (HCC)  09/29/2021 Initial Diagnosis   Multiple myeloma (HCC)   09/29/2021 Cancer Staging   Staging form: Plasma Cell Myeloma and Plasma Cell Disorders, AJCC 8th Edition - Clinical stage from 09/29/2021: No stage assigned - Signed by Creig Hines, MD on 09/29/2021 Stage prefix: Initial diagnosis   10/17/2021 Cancer Staging   Staging form: Plasma Cell Myeloma and Plasma Cell Disorders, AJCC 8th Edition - Clinical stage from 10/17/2021: RISS Stage III (Beta-2-microglobulin (mg/L): 5.8, Albumin (g/dL): 3.3, ISS: Stage III, High-risk cytogenetics: Present, LDH: Normal) - Signed by Creig Hines, MD on 10/17/2021 Stage prefix: Initial diagnosis Beta 2 microglobulin range (mg/L): Greater than or equal to 5.5 Albumin range (g/dL): Less than 3.5 Cytogenetics: 1q addition, t(4;14) translocation Serum calcium level: Normal Serum creatinine level: Elevated   Multiple myeloma not having achieved remission (HCC)  09/29/2021 Initial Diagnosis   Multiple myeloma not having achieved remission (HCC)   10/03/2021 - 11/14/2021 Chemotherapy   Patient is on Treatment Plan : MYELOMA NON-TRANSPLANT CANDIDATES VRd weekly q21d     11/21/2021 -  04/03/2022 Chemotherapy   Patient is on Treatment Plan : MYELOMA NON-TRANSPLANT CANDIDATES VRd weekly q21d      08/06/2022 -  Chemotherapy   Patient is on Treatment Plan : MYELOMA NEWLY DIAGNOSED TRANSPLANT CANDIDATE DaraVRd (Daratumumab SQ) q21d x 6 Cycles (Induction/Consolidation)       ALLERGIES:  is allergic to penicillins.  MEDICATIONS:  Current Outpatient Medications  Medication Sig Dispense Refill   acetaminophen (TYLENOL) 500 MG tablet Take 1,000 mg by mouth daily as needed for mild pain or moderate pain.     acyclovir (ZOVIRAX) 400 MG tablet Take 1 tablet (400 mg total) by mouth 2 (two) times daily. 60 tablet 2   ascorbic acid (VITAMIN C) 500 MG tablet Take 1 tablet (500 mg total) by mouth daily. 90 tablet 0   aspirin EC 81 MG tablet Take 81 mg by mouth in the morning.     docusate sodium (COLACE) 100 MG capsule Take 100 mg by mouth daily as needed for mild constipation.     Glucagon (BAQSIMI ONE PACK) 3 MG/DOSE POWD Place 3 mg into the nose once as needed for up to 1 dose (for low sugar). 1 each 1   insulin degludec (TRESIBA FLEXTOUCH) 100 UNIT/ML FlexTouch Pen Inject 5 Units into the skin daily. Home med.     levETIRAcetam (KEPPRA) 500 MG tablet Take 1 tablet (500 mg total) by mouth 2 (two) times daily. 60 tablet 2   lisinopril (ZESTRIL) 2.5 MG tablet Take by mouth.     Multiple Vitamin (MULTIVITAMIN WITH MINERALS) TABS tablet Take 1 tablet by mouth daily. 90 tablet 1   NOVOLOG 100 UNIT/ML injection Sliding Scale: Glucose 70 - 120: 0 units  Glucose 121 - 150: 1 unit  Glucose 151 - 200: 2 units Glucose 201 - 250: 3 units  Glucose 251 - 300: 5 units  Glucose 301 - 350: 7 units  Glucose 351 - 400: 9 units  Glucose > 400: call your docotr 10 mL 1   ondansetron (ZOFRAN) 8 MG tablet Take 8 mg by mouth daily as needed for nausea or vomiting.     pantoprazole (PROTONIX) 40 MG tablet Take 1 tablet (40 mg total) by mouth 2 (two) times daily. Take twice daily for 2 months then decrease to once daily     pomalidomide (POMALYST) 3 MG capsule Take 1 capsule (3 mg total) by mouth daily. Take for 21  days, then hold for 7 days. Repeat every 28 days 21 capsule 0   atorvastatin (LIPITOR) 80 MG tablet Take 1 tablet (80 mg total) by mouth at bedtime. Hold while taking Paxlovid (Patient not taking: Reported on 12/29/2022) 30 tablet 2   No current facility-administered medications for this visit.    VITAL SIGNS: BP 110/77 (BP Location: Left Arm, Patient Position: Sitting) Comment: Re check  Pulse 76   Temp 97.7 F (36.5 C) (Oral)   Resp 18   Wt 141 lb 9.6 oz (64.2 kg)   SpO2 100%   BMI 20.32 kg/m  Filed Weights   01/22/23 1130  Weight: 141 lb 9.6 oz (64.2 kg)    Estimated body mass index is 20.32 kg/m as calculated from the following:   Height as of 01/15/23: 5\' 10"  (1.778 m).   Weight as of this encounter: 141 lb 9.6 oz (64.2 kg).  LABS: CBC:    Component Value Date/Time   WBC 1.7 (L) 01/22/2023 1102   HGB 11.0 (L) 01/22/2023 1102   HGB 9.4 (L) 11/13/2022 1228  HGB 11.2 (L) 10/03/2020 1317   HCT 33.9 (L) 01/22/2023 1102   HCT 33.1 (L) 10/03/2020 1317   PLT 252 01/22/2023 1102   PLT 327 11/13/2022 1228   PLT 202 10/03/2020 1317   MCV 92.6 01/22/2023 1102   MCV 89 10/03/2020 1317   MCV 91 10/11/2012 0542   NEUTROABS 0.8 (L) 01/22/2023 1102   NEUTROABS 11.6 (H) 10/11/2012 0542   LYMPHSABS 0.8 01/22/2023 1102   LYMPHSABS 0.7 (L) 10/11/2012 0542   MONOABS 0.1 01/22/2023 1102   MONOABS 0.3 10/11/2012 0542   EOSABS 0.0 01/22/2023 1102   EOSABS 0.0 10/11/2012 0542   BASOSABS 0.0 01/22/2023 1102   BASOSABS 0.0 10/11/2012 0542   Comprehensive Metabolic Panel:    Component Value Date/Time   NA 131 (L) 01/22/2023 1102   NA 135 10/03/2020 1317   NA 137 10/11/2012 0542   K 4.5 01/22/2023 1102   K 4.2 10/11/2012 0542   CL 99 01/22/2023 1102   CL 105 10/11/2012 0542   CO2 23 01/22/2023 1102   CO2 26 10/11/2012 0542   BUN 50 (H) 01/22/2023 1102   BUN 24 10/03/2020 1317   BUN 17 10/11/2012 0542   CREATININE 2.41 (H) 01/22/2023 1102   CREATININE 2.08 (H) 11/13/2022  1228   CREATININE 1.36 (H) 10/11/2012 0542   GLUCOSE 360 (H) 01/22/2023 1102   GLUCOSE 239 (H) 10/11/2012 0542   CALCIUM 9.0 01/22/2023 1102   CALCIUM 8.7 10/11/2012 0542   AST 21 01/22/2023 1102   AST 35 11/13/2022 1228   ALT 22 01/22/2023 1102   ALT 54 (H) 11/13/2022 1228   ALKPHOS 56 01/22/2023 1102   BILITOT 0.6 01/22/2023 1102   BILITOT 0.2 (L) 11/13/2022 1228   PROT 8.6 (H) 01/22/2023 1102   PROT 9.1 (H) 10/03/2020 1317   ALBUMIN 3.8 01/22/2023 1102   ALBUMIN 3.3 (L) 10/03/2020 1317    RADIOGRAPHIC STUDIES: No results found.  PERFORMANCE STATUS (ECOG) : 1 - Symptomatic but completely ambulatory  Review of Systems Unless otherwise noted, a complete review of systems is negative.  Physical Exam General: NAD Cardiovascular: regular rate and rhythm Pulmonary: clear ant fields Abdomen: soft, nontender, + bowel sounds GU: no suprapubic tenderness Extremities: no edema, no joint deformities Skin: no rashes Neurological: Weakness but otherwise nonfocal  IMPRESSION/PLAN: Multiple myeloma -on treatment Darzalex  Weakness/fatigue -likely multifactorial.  I suspect a component of dehydration given reported poor oral intake.  I note that serum creatinine is elevated past baseline.  Will proceed with IV fluids and supportive care today.  ED triggers reviewed.  Hyperglycemia -patient reports that he is taking insulin as directed.  Recommend follow-up with PCP.  ED triggers reviewed.  Case and plan discussed with Dr. Smith Robert.  Patient will return to clinic next week.   Patient expressed understanding and was in agreement with this plan. He also understands that He can call clinic at any time with any questions, concerns, or complaints.   Thank you for allowing me to participate in the care of this very pleasant patient.   Time Total: 25 minutes  Visit consisted of counseling and education dealing with the complex and emotionally intense issues of symptom management in the  setting of serious illness.Greater than 50%  of this time was spent counseling and coordinating care related to the above assessment and plan.  Signed by: Laurette Schimke, PhD, NP-C

## 2023-01-25 LAB — KAPPA/LAMBDA LIGHT CHAINS
Kappa free light chain: 5 mg/L (ref 3.3–19.4)
Kappa, lambda light chain ratio: 0.05 — ABNORMAL LOW (ref 0.26–1.65)
Lambda free light chains: 99.1 mg/L — ABNORMAL HIGH (ref 5.7–26.3)

## 2023-01-26 DIAGNOSIS — R627 Adult failure to thrive: Secondary | ICD-10-CM | POA: Insufficient documentation

## 2023-01-27 LAB — MULTIPLE MYELOMA PANEL, SERUM
Albumin SerPl Elph-Mcnc: 3.6 g/dL (ref 2.9–4.4)
Albumin/Glob SerPl: 0.8 (ref 0.7–1.7)
Alpha 1: 0.2 g/dL (ref 0.0–0.4)
Alpha2 Glob SerPl Elph-Mcnc: 0.7 g/dL (ref 0.4–1.0)
B-Globulin SerPl Elph-Mcnc: 0.8 g/dL (ref 0.7–1.3)
Gamma Glob SerPl Elph-Mcnc: 3 g/dL — ABNORMAL HIGH (ref 0.4–1.8)
Globulin, Total: 4.7 g/dL — ABNORMAL HIGH (ref 2.2–3.9)
IgA: 16 mg/dL — ABNORMAL LOW (ref 61–437)
IgG (Immunoglobin G), Serum: 3738 mg/dL — ABNORMAL HIGH (ref 603–1613)
IgM (Immunoglobulin M), Srm: <5 mg/dL — ABNORMAL LOW (ref 20–172)
M Protein SerPl Elph-Mcnc: 2.7 g/dL — ABNORMAL HIGH
Total Protein ELP: 8.3 g/dL (ref 6.0–8.5)

## 2023-01-28 ENCOUNTER — Encounter: Payer: Self-pay | Admitting: Oncology

## 2023-01-28 ENCOUNTER — Other Ambulatory Visit: Payer: Self-pay | Admitting: Oncology

## 2023-01-28 DIAGNOSIS — C9000 Multiple myeloma not having achieved remission: Secondary | ICD-10-CM

## 2023-01-29 ENCOUNTER — Other Ambulatory Visit: Payer: 59

## 2023-01-29 ENCOUNTER — Ambulatory Visit: Payer: 59 | Admitting: Oncology

## 2023-01-29 ENCOUNTER — Ambulatory Visit: Payer: 59

## 2023-01-29 ENCOUNTER — Inpatient Hospital Stay: Payer: 59 | Admitting: Oncology

## 2023-01-29 ENCOUNTER — Other Ambulatory Visit: Payer: Self-pay

## 2023-01-29 ENCOUNTER — Inpatient Hospital Stay: Payer: 59

## 2023-01-30 ENCOUNTER — Other Ambulatory Visit: Payer: Self-pay

## 2023-01-30 ENCOUNTER — Emergency Department
Admission: EM | Admit: 2023-01-30 | Discharge: 2023-01-30 | Discharge status: Against Medical Advice | Payer: 59 | Attending: Emergency Medicine | Admitting: Emergency Medicine

## 2023-01-30 DIAGNOSIS — Z5321 Procedure and treatment not carried out due to patient leaving prior to being seen by health care provider: Secondary | ICD-10-CM | POA: Insufficient documentation

## 2023-01-30 DIAGNOSIS — E1165 Type 2 diabetes mellitus with hyperglycemia: Secondary | ICD-10-CM | POA: Insufficient documentation

## 2023-01-30 DIAGNOSIS — R519 Headache, unspecified: Secondary | ICD-10-CM | POA: Insufficient documentation

## 2023-01-30 LAB — CBG MONITORING, ED: Glucose-Capillary: 436 mg/dL — ABNORMAL HIGH (ref 70–99)

## 2023-01-30 NOTE — ED Notes (Signed)
Pt stated has not ate and is hungry, provided dinner tray and water.

## 2023-01-30 NOTE — ED Notes (Addendum)
Patient throwing a fit yelling and cussing at staff while in triage waiting. States he is leaving. Informed patient of possibility of worsening. Patient is alert and oriented. PIV removed, cannula intact, pressure dressing applied. Patient stable and ambulatory with steady even gait.

## 2023-01-30 NOTE — ED Triage Notes (Signed)
Pt comes via EMs from home with c/o hyperglycemia. Pt is diabetic and CBG was 592. Pt BP 64/40, EMs attempted IV but not able to get it.   EMS did get 20 g in left ac and 500 saline, BP 120/88  Pt states headache. Pt denies any cp.

## 2023-02-01 ENCOUNTER — Inpatient Hospital Stay: Payer: 59

## 2023-02-01 ENCOUNTER — Inpatient Hospital Stay: Payer: 59 | Attending: Oncology

## 2023-02-01 ENCOUNTER — Inpatient Hospital Stay: Payer: 59 | Admitting: Oncology

## 2023-02-05 ENCOUNTER — Inpatient Hospital Stay: Payer: 59 | Attending: Oncology | Admitting: Internal Medicine

## 2023-02-05 ENCOUNTER — Telehealth: Payer: Self-pay | Admitting: *Deleted

## 2023-02-05 NOTE — Telephone Encounter (Signed)
Pt scheduled for a telephone encounter at 930 am with Dr. Barbaraann Cao. Patient showed up at the clinic at 1145 today. He parked his car and ambulated with his cane to the went to the bench by the valet service. Pt complained of being short of breath. Valet driver alerted front desk of patient arrival. Pt refused to come into the clinic to discuss his appointments. Vernis Eid RN and [place] CMA evaluated patient. He is a alert and oriented. He does not appear to be in any acute distress. pt refused to go into the cancer center building to be further evaluated by symptom management. He adamantly refused any vitals. We explained that we wanted to check his blood pressure and oxygen to make sure that he was well. We voiced that we are concerned and would advise vitals to be checked to make sure that it was safe for patient to drive home. Pt again refused vitals to be taken. Pt Stated that if we "took the blood pressure or pulse oxygen that would send me to the hospital." He refused RN to further assess. We explained to patient that we are concerned about this well being. Pt stated "there is nothing wrong with me. I don't need my blood pressure checked. I don't need my oxygen checked. I simply sat down to drink my water." RN explained to patient that his apt was a telephone visit with Dr. Barbaraann Cao not in person. We offered an apt in smc, but patient still declined. He stated then "I will go home. However if I go into your building, you are going to keep me." We offered to r/s the telephone. He accepted this and telephone call was r/s for next Thursday. Dr. Barbaraann Cao did not have any opening next Friday and Dr. Barbaraann Cao agreed to do the telephone call on Thurs 11/14. A new AVS was printed and handed to the patient. [place] and myself escorted the patient to his car to make sure that he did not fall in the parking lot.

## 2023-02-09 ENCOUNTER — Encounter: Payer: Self-pay | Admitting: Oncology

## 2023-02-11 ENCOUNTER — Inpatient Hospital Stay: Payer: 59 | Admitting: Internal Medicine

## 2023-02-12 ENCOUNTER — Encounter: Payer: Self-pay | Admitting: Oncology

## 2023-02-12 ENCOUNTER — Ambulatory Visit: Payer: 59 | Admitting: Oncology

## 2023-02-12 ENCOUNTER — Emergency Department: Payer: 59

## 2023-02-12 ENCOUNTER — Ambulatory Visit: Payer: 59

## 2023-02-12 ENCOUNTER — Other Ambulatory Visit: Payer: Self-pay

## 2023-02-12 ENCOUNTER — Emergency Department
Admission: EM | Admit: 2023-02-12 | Discharge: 2023-02-12 | Disposition: A | Discharge status: Home / Self Care | Payer: 59 | Attending: Emergency Medicine | Admitting: Emergency Medicine

## 2023-02-12 ENCOUNTER — Other Ambulatory Visit: Payer: Self-pay | Admitting: Oncology

## 2023-02-12 ENCOUNTER — Other Ambulatory Visit: Payer: 59

## 2023-02-12 ENCOUNTER — Telehealth: Payer: Self-pay | Admitting: *Deleted

## 2023-02-12 DIAGNOSIS — S0990XA Unspecified injury of head, initial encounter: Secondary | ICD-10-CM | POA: Insufficient documentation

## 2023-02-12 DIAGNOSIS — R799 Abnormal finding of blood chemistry, unspecified: Secondary | ICD-10-CM | POA: Diagnosis not present

## 2023-02-12 DIAGNOSIS — C9000 Multiple myeloma not having achieved remission: Secondary | ICD-10-CM

## 2023-02-12 DIAGNOSIS — W01198A Fall on same level from slipping, tripping and stumbling with subsequent striking against other object, initial encounter: Secondary | ICD-10-CM | POA: Diagnosis not present

## 2023-02-12 DIAGNOSIS — S2231XA Fracture of one rib, right side, initial encounter for closed fracture: Secondary | ICD-10-CM | POA: Insufficient documentation

## 2023-02-12 DIAGNOSIS — S299XXA Unspecified injury of thorax, initial encounter: Secondary | ICD-10-CM | POA: Diagnosis present

## 2023-02-12 DIAGNOSIS — W19XXXA Unspecified fall, initial encounter: Secondary | ICD-10-CM

## 2023-02-12 LAB — CBC WITH DIFFERENTIAL/PLATELET
Abs Immature Granulocytes: 0.01 10*3/uL (ref 0.00–0.07)
Basophils Absolute: 0 10*3/uL (ref 0.0–0.1)
Basophils Relative: 0 %
Eosinophils Absolute: 0 10*3/uL (ref 0.0–0.5)
Eosinophils Relative: 1 %
HCT: 29.5 % — ABNORMAL LOW (ref 39.0–52.0)
Hemoglobin: 9.5 g/dL — ABNORMAL LOW (ref 13.0–17.0)
Immature Granulocytes: 0 %
Lymphocytes Relative: 31 %
Lymphs Abs: 1.2 10*3/uL (ref 0.7–4.0)
MCH: 30.5 pg (ref 26.0–34.0)
MCHC: 32.2 g/dL (ref 30.0–36.0)
MCV: 94.9 fL (ref 80.0–100.0)
Monocytes Absolute: 0.4 10*3/uL (ref 0.1–1.0)
Monocytes Relative: 10 %
Neutro Abs: 2.3 10*3/uL (ref 1.7–7.7)
Neutrophils Relative %: 58 %
Platelets: 214 10*3/uL (ref 150–400)
RBC: 3.11 MIL/uL — ABNORMAL LOW (ref 4.22–5.81)
RDW: 15.1 % (ref 11.5–15.5)
WBC: 3.9 10*3/uL — ABNORMAL LOW (ref 4.0–10.5)
nRBC: 0 % (ref 0.0–0.2)

## 2023-02-12 LAB — COMPREHENSIVE METABOLIC PANEL
ALT: 16 U/L (ref 0–44)
AST: 18 U/L (ref 15–41)
Albumin: 3.6 g/dL (ref 3.5–5.0)
Alkaline Phosphatase: 47 U/L (ref 38–126)
Anion gap: 6 (ref 5–15)
BUN: 42 mg/dL — ABNORMAL HIGH (ref 8–23)
CO2: 23 mmol/L (ref 22–32)
Calcium: 8.8 mg/dL — ABNORMAL LOW (ref 8.9–10.3)
Chloride: 103 mmol/L (ref 98–111)
Creatinine, Ser: 2.2 mg/dL — ABNORMAL HIGH (ref 0.61–1.24)
GFR, Estimated: 32 mL/min — ABNORMAL LOW (ref >60)
Glucose, Bld: 155 mg/dL — ABNORMAL HIGH (ref 70–99)
Potassium: 4.8 mmol/L (ref 3.5–5.1)
Sodium: 132 mmol/L — ABNORMAL LOW (ref 135–145)
Total Bilirubin: 0.5 mg/dL (ref <1.2)
Total Protein: 8.4 g/dL — ABNORMAL HIGH (ref 6.5–8.1)

## 2023-02-12 LAB — CBG MONITORING, ED: Glucose-Capillary: 128 mg/dL — ABNORMAL HIGH (ref 70–99)

## 2023-02-12 LAB — TROPONIN I (HIGH SENSITIVITY): Troponin I (High Sensitivity): 18 ng/L — ABNORMAL HIGH (ref <18)

## 2023-02-12 MED ORDER — TRAMADOL HCL 50 MG PO TABS: 50.0000 mg | ORAL_TABLET | Freq: Four times a day (QID) | ORAL | 0 refills | Status: DC | PRN

## 2023-02-12 MED ORDER — LIDOCAINE-EPINEPHRINE (PF) 2 %-1:200000 IJ SOLN
20.0000 mL | Freq: Once | INTRAMUSCULAR | Status: DC
  Filled 2023-02-12: qty 20

## 2023-02-12 NOTE — ED Provider Notes (Signed)
[facility] Provider Note    Event Date/Time   First MD Initiated Contact with Patient 02/12/23 1354     (approximate)   History   Fall   HPI  Nicholas Weiss is a 72 y.o. male who presents after a fall.  Patient reports he fell last night, reports he injured his right lower chest and also hit his head.  Feels well generally today besides the pain in his chest.     Physical Exam   Triage Vital Signs: ED Triage Vitals  Encounter Vitals Group     BP 02/12/23 1206 106/71     Systolic BP Percentile --      Diastolic BP Percentile --      Pulse Rate 02/12/23 1206 84     Resp 02/12/23 1206 18     Temp 02/12/23 1206 97.7 F (36.5 C)     Temp src --      SpO2 02/12/23 1206 100 %     Weight 02/12/23 1209 65.8 kg (145 lb)     Height 02/12/23 1209 1.778 m (5\' 10" )     Head Circumference --      Peak Flow --      Pain Score 02/12/23 1208 9     Pain Loc --      Pain Education --      Exclude from Growth Chart --     Most recent vital signs: Vitals:   02/12/23 1206  BP: 106/71  Pulse: 84  Resp: 18  Temp: 97.7 F (36.5 C)  SpO2: 100%     General: Awake, no distress.  CV:  Good peripheral perfusion.  Tenderness palpation right lower chest wall, mild bruising noted Resp:  Normal effort.  Abd:  No distention.  Other:  Shallow laceration above the left eye, no indication for suture   ED Results / Procedures / Treatments   Labs (all labs ordered are listed, but only abnormal results are displayed) Labs Reviewed  COMPREHENSIVE METABOLIC PANEL - Abnormal; Notable for the following components:      Result Value   Sodium 132 (*)    Glucose, Bld 155 (*)    BUN 42 (*)    Creatinine, Ser 2.20 (*)    Calcium 8.8 (*)    Total Protein 8.4 (*)    GFR, Estimated 32 (*)    All other components within normal limits  CBC WITH DIFFERENTIAL/PLATELET - Abnormal; Notable for the following components:   WBC 3.9 (*)    RBC 3.11 (*)    Hemoglobin 9.5  (*)    HCT 29.5 (*)    All other components within normal limits  CBG MONITORING, ED - Abnormal; Notable for the following components:   Glucose-Capillary 128 (*)    All other components within normal limits  TROPONIN I (HIGH SENSITIVITY) - Abnormal; Notable for the following components:   Troponin I (High Sensitivity) 18 (*)    All other components within normal limits  URINALYSIS, ROUTINE W REFLEX MICROSCOPIC     EKG  ED ECG REPORT I, Jene Every, the attending physician, personally viewed and interpreted this ECG.  Date: 02/12/2023  Rhythm: normal sinus rhythm QRS Axis: normal Intervals: normal ST/T Wave abnormalities: normal Narrative Interpretation: no evidence of acute ischemia    RADIOLOGY Rib x-ray viewed interpret by me, isolated rib fracture noted  CT head and cervical spine are without acute abnormalities  PROCEDURES:  Critical Care performed:   Procedures   MEDICATIONS ORDERED  IN ED: Medications  lidocaine-EPINEPHrine (XYLOCAINE W/EPI) 2 %-1:200000 (PF) injection 20 mL (20 mLs Intradermal Not Given 02/12/23 1418)     IMPRESSION / MDM / ASSESSMENT AND PLAN / ED COURSE  I reviewed the triage vital signs and the nursing notes. Patient's presentation is most consistent with acute presentation with potential threat to life or bodily function.  Patient presents after fall which occurred last night with head injury as well as chest injury  Chest contusion versus fracture, x-rays pending  CT head and cervical spine are reassuring  Lab work reviewed and no significant change from prior  Rib x-ray consistent with rib fracture, no indication for repair of shallow laceration.  Analgesics provided, appropriate for discharge, no indication for admission at this time.        FINAL CLINICAL IMPRESSION(S) / ED DIAGNOSES   Final diagnoses:  Fall, initial encounter  Closed fracture of one rib of right side, initial encounter  Minor head injury, initial  encounter     Rx / DC Orders   ED Discharge Orders          Ordered    traMADol (ULTRAM) 50 MG tablet  Every 6 hours PRN        02/12/23 1409             Note:  This document was prepared using Dragon voice recognition software and may include unintentional dictation errors.   Jene Every, MD 02/12/23 1534

## 2023-02-12 NOTE — ED Provider Triage Note (Addendum)
Emergency Medicine Provider Triage Evaluation Note  Nicholas Weiss , a 72 y.o. male  was evaluated in triage.  Pt complains of fall last night. Patient states he passed out and fell backwards in the kitchen. He had two falls last night. He did hit his head on a chair. Most of his pain to the right ribs.  Review of Systems  Positive: Rib pain, headache, SOB, light headed when standing Negative: Dizziness,   Physical Exam  Temp 97.7 F (36.5 C)  Gen:   Awake, no distress   Resp:  Normal effort  MSK:   Moves extremities without difficulty  Other:  Approximately 1 cm laceration to left eyelid, no bleeding  Medical Decision Making  Medically screening exam initiated at 12:06 PM.  Appropriate orders placed.  Nicholas Weiss was informed that the remainder of the evaluation will be completed by another provider, this initial triage assessment does not replace that evaluation, and the importance of remaining in the ED until their evaluation is complete.     Cameron Ali, PA-C 02/12/23 1211    Cameron Ali, PA-C 02/12/23 1233

## 2023-02-12 NOTE — Telephone Encounter (Signed)
Patient called to cancel his appointment today due to falling and injuring himself last night. I let him know that his appointment for today had been cancelled already (on 10/31) and moved to Monday 11/18. He said that he fellin the kitchen last night and is having a lot of pain and difficulty breathing. He said that he did not go to ER for it. I encouraged him to go to ER which he initially declined,or at least an urgent care center as he could have broken a rib or worse punctured a lung with his difficulty breathing. He sounds in distress just speaking with him on phone. He said that he will go to ER to be checked out.

## 2023-02-12 NOTE — ED Triage Notes (Signed)
Pt to ED for fall last night, states "I went out, I'm a diabetic". Denies CBG being low. Laceration to left eye. C/o right sided rib cage pain from hitting on chair.

## 2023-02-14 ENCOUNTER — Other Ambulatory Visit: Payer: Self-pay

## 2023-02-15 ENCOUNTER — Inpatient Hospital Stay: Payer: 59

## 2023-02-15 ENCOUNTER — Inpatient Hospital Stay
Admission: EM | Admit: 2023-02-15 | Discharge: 2023-02-24 | DRG: 082 | Disposition: A | Discharge status: Skilled Nursing Facility | Payer: 59 | Attending: Student | Admitting: Student

## 2023-02-15 ENCOUNTER — Inpatient Hospital Stay: Payer: 59 | Admitting: Oncology

## 2023-02-15 ENCOUNTER — Other Ambulatory Visit: Payer: Self-pay

## 2023-02-15 ENCOUNTER — Emergency Department: Payer: 59

## 2023-02-15 ENCOUNTER — Encounter: Payer: Self-pay | Admitting: Emergency Medicine

## 2023-02-15 DIAGNOSIS — R55 Syncope and collapse: Secondary | ICD-10-CM

## 2023-02-15 DIAGNOSIS — Z9181 History of falling: Secondary | ICD-10-CM

## 2023-02-15 DIAGNOSIS — Z8249 Family history of ischemic heart disease and other diseases of the circulatory system: Secondary | ICD-10-CM

## 2023-02-15 DIAGNOSIS — E1040 Type 1 diabetes mellitus with diabetic neuropathy, unspecified: Secondary | ICD-10-CM | POA: Diagnosis present

## 2023-02-15 DIAGNOSIS — I619 Nontraumatic intracerebral hemorrhage, unspecified: Secondary | ICD-10-CM | POA: Diagnosis not present

## 2023-02-15 DIAGNOSIS — G253 Myoclonus: Secondary | ICD-10-CM | POA: Diagnosis present

## 2023-02-15 DIAGNOSIS — E43 Unspecified severe protein-calorie malnutrition: Secondary | ICD-10-CM | POA: Diagnosis present

## 2023-02-15 DIAGNOSIS — Y929 Unspecified place or not applicable: Secondary | ICD-10-CM

## 2023-02-15 DIAGNOSIS — C9000 Multiple myeloma not having achieved remission: Secondary | ICD-10-CM | POA: Diagnosis present

## 2023-02-15 DIAGNOSIS — E1022 Type 1 diabetes mellitus with diabetic chronic kidney disease: Secondary | ICD-10-CM | POA: Diagnosis present

## 2023-02-15 DIAGNOSIS — Z79899 Other long term (current) drug therapy: Secondary | ICD-10-CM

## 2023-02-15 DIAGNOSIS — I480 Paroxysmal atrial fibrillation: Secondary | ICD-10-CM | POA: Diagnosis present

## 2023-02-15 DIAGNOSIS — Z8673 Personal history of transient ischemic attack (TIA), and cerebral infarction without residual deficits: Secondary | ICD-10-CM

## 2023-02-15 DIAGNOSIS — I129 Hypertensive chronic kidney disease with stage 1 through stage 4 chronic kidney disease, or unspecified chronic kidney disease: Secondary | ICD-10-CM | POA: Diagnosis present

## 2023-02-15 DIAGNOSIS — Z794 Long term (current) use of insulin: Secondary | ICD-10-CM

## 2023-02-15 DIAGNOSIS — E559 Vitamin D deficiency, unspecified: Secondary | ICD-10-CM | POA: Diagnosis present

## 2023-02-15 DIAGNOSIS — I951 Orthostatic hypotension: Secondary | ICD-10-CM | POA: Diagnosis present

## 2023-02-15 DIAGNOSIS — Z880 Allergy status to penicillin: Secondary | ICD-10-CM

## 2023-02-15 DIAGNOSIS — K219 Gastro-esophageal reflux disease without esophagitis: Secondary | ICD-10-CM | POA: Insufficient documentation

## 2023-02-15 DIAGNOSIS — N1832 Chronic kidney disease, stage 3b: Secondary | ICD-10-CM | POA: Diagnosis present

## 2023-02-15 DIAGNOSIS — S2241XA Multiple fractures of ribs, right side, initial encounter for closed fracture: Principal | ICD-10-CM | POA: Diagnosis present

## 2023-02-15 DIAGNOSIS — E1165 Type 2 diabetes mellitus with hyperglycemia: Secondary | ICD-10-CM

## 2023-02-15 DIAGNOSIS — D709 Neutropenia, unspecified: Secondary | ICD-10-CM | POA: Diagnosis not present

## 2023-02-15 DIAGNOSIS — S0634AA Traumatic hemorrhage of right cerebrum with loss of consciousness status unknown, initial encounter: Secondary | ICD-10-CM | POA: Diagnosis not present

## 2023-02-15 DIAGNOSIS — E1065 Type 1 diabetes mellitus with hyperglycemia: Secondary | ICD-10-CM | POA: Diagnosis present

## 2023-02-15 DIAGNOSIS — I615 Nontraumatic intracerebral hemorrhage, intraventricular: Secondary | ICD-10-CM

## 2023-02-15 DIAGNOSIS — Z7901 Long term (current) use of anticoagulants: Secondary | ICD-10-CM

## 2023-02-15 DIAGNOSIS — W19XXXA Unspecified fall, initial encounter: Secondary | ICD-10-CM | POA: Diagnosis present

## 2023-02-15 DIAGNOSIS — E7800 Pure hypercholesterolemia, unspecified: Secondary | ICD-10-CM | POA: Diagnosis present

## 2023-02-15 DIAGNOSIS — Z7952 Long term (current) use of systemic steroids: Secondary | ICD-10-CM

## 2023-02-15 DIAGNOSIS — G901 Familial dysautonomia [Riley-Day]: Secondary | ICD-10-CM | POA: Diagnosis present

## 2023-02-15 DIAGNOSIS — E1140 Type 2 diabetes mellitus with diabetic neuropathy, unspecified: Secondary | ICD-10-CM | POA: Insufficient documentation

## 2023-02-15 DIAGNOSIS — D638 Anemia in other chronic diseases classified elsewhere: Secondary | ICD-10-CM | POA: Diagnosis present

## 2023-02-15 DIAGNOSIS — Z681 Body mass index (BMI) 19 or less, adult: Secondary | ICD-10-CM

## 2023-02-15 DIAGNOSIS — I10 Essential (primary) hypertension: Secondary | ICD-10-CM

## 2023-02-15 DIAGNOSIS — Z89412 Acquired absence of left great toe: Secondary | ICD-10-CM

## 2023-02-15 DIAGNOSIS — E871 Hypo-osmolality and hyponatremia: Secondary | ICD-10-CM | POA: Diagnosis present

## 2023-02-15 LAB — COMPREHENSIVE METABOLIC PANEL
ALT: 15 U/L (ref 0–44)
AST: 15 U/L (ref 15–41)
Albumin: 3.4 g/dL — ABNORMAL LOW (ref 3.5–5.0)
Alkaline Phosphatase: 46 U/L (ref 38–126)
Anion gap: 8 (ref 5–15)
BUN: 39 mg/dL — ABNORMAL HIGH (ref 8–23)
CO2: 22 mmol/L (ref 22–32)
Calcium: 8.6 mg/dL — ABNORMAL LOW (ref 8.9–10.3)
Chloride: 99 mmol/L (ref 98–111)
Creatinine, Ser: 2.17 mg/dL — ABNORMAL HIGH (ref 0.61–1.24)
GFR, Estimated: 32 mL/min — ABNORMAL LOW (ref >60)
Glucose, Bld: 413 mg/dL — ABNORMAL HIGH (ref 70–99)
Potassium: 5.1 mmol/L (ref 3.5–5.1)
Sodium: 129 mmol/L — ABNORMAL LOW (ref 135–145)
Total Bilirubin: 0.5 mg/dL (ref <1.2)
Total Protein: 8.9 g/dL — ABNORMAL HIGH (ref 6.5–8.1)

## 2023-02-15 LAB — URINALYSIS, COMPLETE (UACMP) WITH MICROSCOPIC
Bacteria, UA: NONE SEEN
Bilirubin Urine: NEGATIVE
Glucose, UA: >=500 mg/dL — AB
Hgb urine dipstick: NEGATIVE
Ketones, ur: NEGATIVE mg/dL
Leukocytes,Ua: NEGATIVE
Nitrite: NEGATIVE
Protein, ur: NEGATIVE mg/dL
RBC / HPF: 0 RBC/hpf (ref 0–5)
Specific Gravity, Urine: 1.022 (ref 1.005–1.030)
pH: 5 (ref 5.0–8.0)

## 2023-02-15 LAB — CBC
HCT: 27.9 % — ABNORMAL LOW (ref 39.0–52.0)
Hemoglobin: 9.1 g/dL — ABNORMAL LOW (ref 13.0–17.0)
MCH: 30.7 pg (ref 26.0–34.0)
MCHC: 32.6 g/dL (ref 30.0–36.0)
MCV: 94.3 fL (ref 80.0–100.0)
Platelets: 226 10*3/uL (ref 150–400)
RBC: 2.96 MIL/uL — ABNORMAL LOW (ref 4.22–5.81)
RDW: 14.7 % (ref 11.5–15.5)
WBC: 3.2 10*3/uL — ABNORMAL LOW (ref 4.0–10.5)
nRBC: 0.6 % — ABNORMAL HIGH (ref 0.0–0.2)

## 2023-02-15 LAB — CBG MONITORING, ED: Glucose-Capillary: 369 mg/dL — ABNORMAL HIGH (ref 70–99)

## 2023-02-15 LAB — MAGNESIUM: Magnesium: 2.2 mg/dL (ref 1.7–2.4)

## 2023-02-15 LAB — TROPONIN I (HIGH SENSITIVITY): Troponin I (High Sensitivity): 18 ng/L — ABNORMAL HIGH (ref <18)

## 2023-02-15 MED ORDER — IOHEXOL 350 MG/ML SOLN
75.0000 mL | Freq: Once | INTRAVENOUS | Status: AC | PRN
Start: 2023-02-15 — End: 2023-02-15
  Administered 2023-02-15: 75 mL via INTRAVENOUS

## 2023-02-15 MED ORDER — INSULIN ASPART 100 UNIT/ML IJ SOLN
5.0000 [IU] | Freq: Once | INTRAMUSCULAR | Status: AC
  Administered 2023-02-15: 5 [IU] via INTRAVENOUS
  Filled 2023-02-15: qty 1

## 2023-02-15 MED ORDER — OXYCODONE HCL 5 MG PO TABS
5.0000 mg | ORAL_TABLET | Freq: Once | ORAL | Status: AC
  Administered 2023-02-15: 5 mg via ORAL
  Filled 2023-02-15: qty 1

## 2023-02-15 MED ORDER — SODIUM CHLORIDE 0.9 % IV BOLUS
1000.0000 mL | Freq: Once | INTRAVENOUS | Status: AC
  Administered 2023-02-15: 1000 mL via INTRAVENOUS

## 2023-02-15 NOTE — ED Notes (Signed)
BG 369. Pt hasn't taken any of his SSI today

## 2023-02-15 NOTE — ED Provider Notes (Signed)
11:20 PM  Assumed care at shift change.  Patient here with recurrent syncope.  Prior visit showed multiple rib fractures.  Now has intracranial hemorrhage.  Neurosurgery aware.  CTA head and neck pending.  Will need admission.  1:33 AM  Pt's CT head showed no aneurysm.  No active extravasation.  Will admit to medicine for recurrent syncope, rib fractures, intracranial hemorrhage.  Consulted and discussed patient's case with hospitalist, Dr. Arville Care.  I have recommended admission and consulting physician agrees and will place admission orders.  Patient (and family if present) agree with this plan.   I reviewed all nursing notes, vitals, pertinent previous records.  All labs, EKGs, imaging ordered have been independently reviewed and interpreted by myself.    Guerin Lashomb, Layla Maw, DO 02/16/23 [phone]

## 2023-02-15 NOTE — ED Notes (Signed)
Pt taken to room 18 via w/c by EDT Marilynne Halsted; report called to Gardiner Sleeper, RN

## 2023-02-15 NOTE — ED Provider Notes (Addendum)
[facility] Provider Note    Event Date/Time   First MD Initiated Contact with Patient 02/15/23 2042     (approximate)   History   Loss of Consciousness   HPI  Nicholas Weiss is a 72 y.o. male with type 1 diabetes, CKD, hypertension, anemia who comes in with concerns for LOC.  Patient reports having broken ribs from a recent fall.  Patient reports his family member is with him today who states that after he was discharged home he had a syncopal episode where he woke up on the ground.  EMS said it was from his sugar being low in the 60s and he refused to come into the ER.  He then had another syncopal episode yesterday.  He does not remember either of these events.  He denies any headaches but family report that he seems more confused than normal.  He continues to have significant right sided chest pain.  Denies any shortness of breath, abdominal pain.   Physical Exam   Triage Vital Signs: ED Triage Vitals  Encounter Vitals Group     BP 02/15/23 1709 (!) 171/99     Systolic BP Percentile --      Diastolic BP Percentile --      Pulse Rate 02/15/23 1709 73     Resp 02/15/23 1709 16     Temp 02/15/23 1709 97.6 F (36.4 C)     Temp Source 02/15/23 1709 Oral     SpO2 02/15/23 1709 96 %     Weight 02/15/23 1713 141 lb (64 kg)     Height 02/15/23 1713 5\' 10"  (1.778 m)     Head Circumference --      Peak Flow --      Pain Score 02/15/23 1710 9     Pain Loc --      Pain Education --      Exclude from Growth Chart --     Most recent vital signs: Vitals:   02/15/23 1709  BP: (!) 171/99  Pulse: 73  Resp: 16  Temp: 97.6 F (36.4 C)  SpO2: 96%     General: Awake, no distress.  CV:  Good peripheral perfusion.  Resp:  Normal effort.  Abd:  No distention.  Other:  Chest wall tenderness on the right   ED Results / Procedures / Treatments   Labs (all labs ordered are listed, but only abnormal results are displayed) Labs Reviewed  CBC -  Abnormal; Notable for the following components:      Result Value   WBC 3.2 (*)    RBC 2.96 (*)    Hemoglobin 9.1 (*)    HCT 27.9 (*)    nRBC 0.6 (*)    All other components within normal limits  COMPREHENSIVE METABOLIC PANEL - Abnormal; Notable for the following components:   Sodium 129 (*)    Glucose, Bld 413 (*)    BUN 39 (*)    Creatinine, Ser 2.17 (*)    Calcium 8.6 (*)    Total Protein 8.9 (*)    Albumin 3.4 (*)    GFR, Estimated 32 (*)    All other components within normal limits  CBG MONITORING, ED - Abnormal; Notable for the following components:   Glucose-Capillary 369 (*)    All other components within normal limits  TROPONIN I (HIGH SENSITIVITY) - Abnormal; Notable for the following components:   Troponin I (High Sensitivity) 18 (*)    All other components within  normal limits  URINALYSIS, COMPLETE (UACMP) WITH MICROSCOPIC     EKG  My interpretation of EKG:  Normal sinus rate of 81 without any ST elevation or T wave inversions, QTc 548  RADIOLOGY I have reviewed the CT head personally and interpreted and small amount of hemorrhage  PROCEDURES:  Critical Care performed: =yes  .1-3 Lead EKG Interpretation  Performed by: Concha Se, MD Authorized by: Concha Se, MD     Interpretation: normal     ECG rate:  70   ECG rate assessment: normal     Rhythm: sinus rhythm     Ectopy: none   .Critical Care  Performed by: Concha Se, MD Authorized by: Concha Se, MD   Critical care provider statement:    Critical care time (minutes):  30   Critical care was necessary to treat or prevent imminent or life-threatening deterioration of the following conditions:  CNS failure or compromise   Critical care was time spent personally by me on the following activities:  Development of treatment plan with patient or surrogate, discussions with consultants, evaluation of patient's response to treatment, examination of patient, ordering and review of laboratory  studies, ordering and review of radiographic studies, ordering and performing treatments and interventions, pulse oximetry, re-evaluation of patient's condition and review of old charts    MEDICATIONS ORDERED IN ED: Medications - No data to display   IMPRESSION / MDM / ASSESSMENT AND PLAN / ED COURSE  I reviewed the triage vital signs and the nursing notes.   Patient's presentation is most consistent with acute presentation with potential threat to life or bodily function.   Patient comes in with syncopal episode could be from hypoglycemia but his sugar was only in the 60s, does have prolonged QTc noted on his EKG, will get a repeat CT head to evaluate for intracranial hemorrhage, cervical fracture, rib fractures, pneumothorax.  CBC shows stable hemoglobin of 9.1 and stable low white count of 3.2.  CMP shows slightly low sodium but similar to prior.  His sugars are elevated to 413 but creatinine is around baseline.  Anion gap is normal unlikely to be DKA.  Initial troponin is slightly elevated  CT scan concerning for rib fractures  IMPRESSION: 1. Acute fractures of the lateral right 8-10th ribs. No pneumothorax. 2. Advanced coronary vascular calcification. 3. A 3 mm nonobstructing right renal upper pole calculus. 4.  Aortic Atherosclerosis (ICD10-I70.0).  IMPRESSION: 1. Small amount of hyperdense hemorrhage in the right frontal horn, which is new from the 02/12/2023 exam, of indeterminate etiology. No hydrocephalus. 2. No acute fracture or traumatic listhesis in the cervical spine.  Discussed the case with Dr. Katrinka Blazing from neurosurgery-recommend repeat CTA now and then will need a 6-hour repeat CT head.  Patient be handed off to oncoming team pending CTA in case this is positive patient may require transfer otherwise patient can be admitted to the hospital for syncope workup.    The patient is on the cardiac monitor to evaluate for evidence of arrhythmia and/or significant heart  rate changes.      FINAL CLINICAL IMPRESSION(S) / ED DIAGNOSES   Final diagnoses:  Closed fracture of multiple ribs of right side, initial encounter  Brain bleed (HCC)  Syncope, unspecified syncope type     Rx / DC Orders   ED Discharge Orders     None        Note:  This document was prepared using Dragon voice recognition software and may include  unintentional dictation errors.   Concha Se, MD 02/15/23 2229    Concha Se, MD 03/03/23 [phone]

## 2023-02-15 NOTE — ED Triage Notes (Addendum)
Pt sent from [facility] today. States he has broken ribs from fall last week. Notes from [facility] report pt fell again 11/16 with increased rib pain and reports syncopal event 11/17 where he was caught by a family member prior to fall. Pt reports not being able to speak much d/t rib pain.

## 2023-02-16 ENCOUNTER — Inpatient Hospital Stay: Payer: 59

## 2023-02-16 DIAGNOSIS — E1065 Type 1 diabetes mellitus with hyperglycemia: Secondary | ICD-10-CM

## 2023-02-16 DIAGNOSIS — Z8673 Personal history of transient ischemic attack (TIA), and cerebral infarction without residual deficits: Secondary | ICD-10-CM | POA: Diagnosis not present

## 2023-02-16 DIAGNOSIS — I10 Essential (primary) hypertension: Secondary | ICD-10-CM

## 2023-02-16 DIAGNOSIS — E1165 Type 2 diabetes mellitus with hyperglycemia: Secondary | ICD-10-CM

## 2023-02-16 DIAGNOSIS — I619 Nontraumatic intracerebral hemorrhage, unspecified: Secondary | ICD-10-CM

## 2023-02-16 DIAGNOSIS — E1040 Type 1 diabetes mellitus with diabetic neuropathy, unspecified: Secondary | ICD-10-CM | POA: Diagnosis present

## 2023-02-16 DIAGNOSIS — E1140 Type 2 diabetes mellitus with diabetic neuropathy, unspecified: Secondary | ICD-10-CM | POA: Insufficient documentation

## 2023-02-16 DIAGNOSIS — D638 Anemia in other chronic diseases classified elsewhere: Secondary | ICD-10-CM | POA: Diagnosis present

## 2023-02-16 DIAGNOSIS — G253 Myoclonus: Secondary | ICD-10-CM | POA: Diagnosis present

## 2023-02-16 DIAGNOSIS — S2241XA Multiple fractures of ribs, right side, initial encounter for closed fracture: Secondary | ICD-10-CM | POA: Diagnosis present

## 2023-02-16 DIAGNOSIS — S0634AA Traumatic hemorrhage of right cerebrum with loss of consciousness status unknown, initial encounter: Secondary | ICD-10-CM | POA: Diagnosis present

## 2023-02-16 DIAGNOSIS — D709 Neutropenia, unspecified: Secondary | ICD-10-CM | POA: Diagnosis not present

## 2023-02-16 DIAGNOSIS — I480 Paroxysmal atrial fibrillation: Secondary | ICD-10-CM | POA: Diagnosis present

## 2023-02-16 DIAGNOSIS — E871 Hypo-osmolality and hyponatremia: Secondary | ICD-10-CM

## 2023-02-16 DIAGNOSIS — W19XXXA Unspecified fall, initial encounter: Secondary | ICD-10-CM | POA: Diagnosis present

## 2023-02-16 DIAGNOSIS — C9000 Multiple myeloma not having achieved remission: Secondary | ICD-10-CM | POA: Diagnosis present

## 2023-02-16 DIAGNOSIS — I615 Nontraumatic intracerebral hemorrhage, intraventricular: Secondary | ICD-10-CM | POA: Diagnosis not present

## 2023-02-16 DIAGNOSIS — I951 Orthostatic hypotension: Secondary | ICD-10-CM | POA: Diagnosis present

## 2023-02-16 DIAGNOSIS — Z681 Body mass index (BMI) 19 or less, adult: Secondary | ICD-10-CM | POA: Diagnosis not present

## 2023-02-16 DIAGNOSIS — K219 Gastro-esophageal reflux disease without esophagitis: Secondary | ICD-10-CM | POA: Diagnosis present

## 2023-02-16 DIAGNOSIS — N1832 Chronic kidney disease, stage 3b: Secondary | ICD-10-CM | POA: Diagnosis present

## 2023-02-16 DIAGNOSIS — I611 Nontraumatic intracerebral hemorrhage in hemisphere, cortical: Secondary | ICD-10-CM

## 2023-02-16 DIAGNOSIS — Z8249 Family history of ischemic heart disease and other diseases of the circulatory system: Secondary | ICD-10-CM | POA: Diagnosis not present

## 2023-02-16 DIAGNOSIS — E43 Unspecified severe protein-calorie malnutrition: Secondary | ICD-10-CM | POA: Diagnosis present

## 2023-02-16 DIAGNOSIS — E1022 Type 1 diabetes mellitus with diabetic chronic kidney disease: Secondary | ICD-10-CM | POA: Diagnosis present

## 2023-02-16 DIAGNOSIS — G901 Familial dysautonomia [Riley-Day]: Secondary | ICD-10-CM | POA: Diagnosis present

## 2023-02-16 DIAGNOSIS — Z794 Long term (current) use of insulin: Secondary | ICD-10-CM | POA: Diagnosis not present

## 2023-02-16 DIAGNOSIS — Z7901 Long term (current) use of anticoagulants: Secondary | ICD-10-CM | POA: Diagnosis not present

## 2023-02-16 DIAGNOSIS — Z79899 Other long term (current) drug therapy: Secondary | ICD-10-CM | POA: Diagnosis not present

## 2023-02-16 DIAGNOSIS — E7800 Pure hypercholesterolemia, unspecified: Secondary | ICD-10-CM | POA: Diagnosis present

## 2023-02-16 DIAGNOSIS — I129 Hypertensive chronic kidney disease with stage 1 through stage 4 chronic kidney disease, or unspecified chronic kidney disease: Secondary | ICD-10-CM | POA: Diagnosis present

## 2023-02-16 DIAGNOSIS — Y929 Unspecified place or not applicable: Secondary | ICD-10-CM | POA: Diagnosis not present

## 2023-02-16 LAB — GLUCOSE, CAPILLARY
Glucose-Capillary: 223 mg/dL — ABNORMAL HIGH (ref 70–99)
Glucose-Capillary: 236 mg/dL — ABNORMAL HIGH (ref 70–99)
Glucose-Capillary: 202 mg/dL — ABNORMAL HIGH (ref 70–99)
Glucose-Capillary: 129 mg/dL — ABNORMAL HIGH (ref 70–99)
Glucose-Capillary: 156 mg/dL — ABNORMAL HIGH (ref 70–99)

## 2023-02-16 LAB — CBC WITH DIFFERENTIAL/PLATELET
Abs Immature Granulocytes: 0 10*3/uL (ref 0.00–0.07)
Basophils Absolute: 0 10*3/uL (ref 0.0–0.1)
Basophils Relative: 0 %
Eosinophils Absolute: 0 10*3/uL (ref 0.0–0.5)
Eosinophils Relative: 1 %
HCT: 29.7 % — ABNORMAL LOW (ref 39.0–52.0)
Hemoglobin: 9.6 g/dL — ABNORMAL LOW (ref 13.0–17.0)
Immature Granulocytes: 0 %
Lymphocytes Relative: 38 %
Lymphs Abs: 1.2 10*3/uL (ref 0.7–4.0)
MCH: 30.3 pg (ref 26.0–34.0)
MCHC: 32.3 g/dL (ref 30.0–36.0)
MCV: 93.7 fL (ref 80.0–100.0)
Monocytes Absolute: 0.3 10*3/uL (ref 0.1–1.0)
Monocytes Relative: 9 %
Neutro Abs: 1.6 10*3/uL — ABNORMAL LOW (ref 1.7–7.7)
Neutrophils Relative %: 52 %
Platelets: 225 10*3/uL (ref 150–400)
RBC: 3.17 MIL/uL — ABNORMAL LOW (ref 4.22–5.81)
RDW: 14.6 % (ref 11.5–15.5)
WBC: 3.1 10*3/uL — ABNORMAL LOW (ref 4.0–10.5)
nRBC: 0 % (ref 0.0–0.2)

## 2023-02-16 LAB — CBG MONITORING, ED: Glucose-Capillary: 202 mg/dL — ABNORMAL HIGH (ref 70–99)

## 2023-02-16 LAB — BASIC METABOLIC PANEL
Anion gap: 8 (ref 5–15)
BUN: 35 mg/dL — ABNORMAL HIGH (ref 8–23)
CO2: 24 mmol/L (ref 22–32)
Calcium: 8.6 mg/dL — ABNORMAL LOW (ref 8.9–10.3)
Chloride: 103 mmol/L (ref 98–111)
Creatinine, Ser: 1.72 mg/dL — ABNORMAL HIGH (ref 0.61–1.24)
GFR, Estimated: 42 mL/min — ABNORMAL LOW (ref >60)
Glucose, Bld: 226 mg/dL — ABNORMAL HIGH (ref 70–99)
Potassium: 4.7 mmol/L (ref 3.5–5.1)
Sodium: 135 mmol/L (ref 135–145)

## 2023-02-16 LAB — MRSA NEXT GEN BY PCR, NASAL: MRSA by PCR Next Gen: DETECTED — AB

## 2023-02-16 MED ORDER — FLUDROCORTISONE ACETATE 0.1 MG PO TABS
0.1000 mg | ORAL_TABLET | Freq: Every day | ORAL | Status: DC
  Administered 2023-02-16 – 2023-02-24 (×8): 0.1 mg via ORAL
  Filled 2023-02-16 (×9): qty 1

## 2023-02-16 MED ORDER — PANTOPRAZOLE SODIUM 40 MG IV SOLR: 40.0000 mg | Freq: Every day | INTRAVENOUS | Status: DC

## 2023-02-16 MED ORDER — STROKE: EARLY STAGES OF RECOVERY BOOK: Freq: Once | Status: DC

## 2023-02-16 MED ORDER — ACETAMINOPHEN 650 MG RE SUPP: 650.0000 mg | RECTAL | Status: DC | PRN

## 2023-02-16 MED ORDER — LISINOPRIL 5 MG PO TABS: 2.5000 mg | ORAL_TABLET | Freq: Every day | ORAL | Status: DC

## 2023-02-16 MED ORDER — INSULIN ASPART 100 UNIT/ML IJ SOLN
0.0000 [IU] | Freq: Three times a day (TID) | INTRAMUSCULAR | Status: DC
  Administered 2023-02-16: 7 [IU] via SUBCUTANEOUS
  Filled 2023-02-16: qty 1

## 2023-02-16 MED ORDER — GABAPENTIN 300 MG PO CAPS
300.0000 mg | ORAL_CAPSULE | Freq: Two times a day (BID) | ORAL | Status: DC
Start: 2023-02-16 — End: 2023-02-24
  Administered 2023-02-16 – 2023-02-24 (×16): 300 mg via ORAL
  Filled 2023-02-16 (×16): qty 1

## 2023-02-16 MED ORDER — PANTOPRAZOLE SODIUM 40 MG PO TBEC
40.0000 mg | DELAYED_RELEASE_TABLET | Freq: Every day | ORAL | Status: DC
  Administered 2023-02-16 – 2023-02-23 (×8): 40 mg via ORAL
  Filled 2023-02-16 (×8): qty 1

## 2023-02-16 MED ORDER — ACETAMINOPHEN 325 MG PO TABS
650.0000 mg | ORAL_TABLET | ORAL | Status: DC | PRN
  Administered 2023-02-16 – 2023-02-23 (×9): 650 mg via ORAL
  Filled 2023-02-16 (×9): qty 2

## 2023-02-16 MED ORDER — INSULIN GLARGINE-YFGN 100 UNIT/ML ~~LOC~~ SOLN
5.0000 [IU] | Freq: Every day | SUBCUTANEOUS | Status: DC
  Administered 2023-02-17 – 2023-02-24 (×8): 5 [IU] via SUBCUTANEOUS
  Filled 2023-02-16 (×10): qty 0.05

## 2023-02-16 MED ORDER — ACETAMINOPHEN 160 MG/5ML PO SOLN: 650.0000 mg | ORAL | Status: DC | PRN

## 2023-02-16 MED ORDER — SENNOSIDES-DOCUSATE SODIUM 8.6-50 MG PO TABS
1.0000 | ORAL_TABLET | Freq: Two times a day (BID) | ORAL | Status: DC
  Administered 2023-02-16 – 2023-02-24 (×14): 1 via ORAL
  Filled 2023-02-16 (×15): qty 1

## 2023-02-16 MED ORDER — INSULIN ASPART 100 UNIT/ML IJ SOLN: 0.0000 [IU] | Freq: Every day | INTRAMUSCULAR | Status: DC

## 2023-02-16 MED ORDER — TRAZODONE HCL 50 MG PO TABS
25.0000 mg | ORAL_TABLET | Freq: Every evening | ORAL | Status: DC | PRN
  Administered 2023-02-16 – 2023-02-18 (×2): 25 mg via ORAL
  Filled 2023-02-16 (×3): qty 1

## 2023-02-16 MED ORDER — CHLORHEXIDINE GLUCONATE CLOTH 2 % EX PADS
6.0000 | MEDICATED_PAD | Freq: Every day | CUTANEOUS | Status: DC
  Administered 2023-02-16: 6 via TOPICAL

## 2023-02-16 MED ORDER — ONDANSETRON HCL 4 MG/2ML IJ SOLN: 4.0000 mg | INTRAMUSCULAR | Status: DC | PRN

## 2023-02-16 MED ORDER — ADULT MULTIVITAMIN W/MINERALS CH
1.0000 | ORAL_TABLET | Freq: Every day | ORAL | Status: DC
  Administered 2023-02-16 – 2023-02-24 (×8): 1 via ORAL
  Filled 2023-02-16 (×8): qty 1

## 2023-02-16 MED ORDER — ZIPRASIDONE MESYLATE 20 MG IM SOLR
10.0000 mg | Freq: Four times a day (QID) | INTRAMUSCULAR | Status: DC | PRN
  Administered 2023-02-20: 10 mg via INTRAMUSCULAR
  Filled 2023-02-16 (×2): qty 20

## 2023-02-16 MED ORDER — ENSURE ENLIVE PO LIQD
237.0000 mL | Freq: Two times a day (BID) | ORAL | Status: DC
  Administered 2023-02-16 – 2023-02-22 (×9): 237 mL via ORAL

## 2023-02-16 MED ORDER — HYDRALAZINE HCL 50 MG PO TABS
25.0000 mg | ORAL_TABLET | Freq: Three times a day (TID) | ORAL | Status: DC
  Administered 2023-02-16 – 2023-02-17 (×3): 25 mg via ORAL
  Filled 2023-02-16 (×4): qty 1

## 2023-02-16 MED ORDER — MIDODRINE HCL 5 MG PO TABS
10.0000 mg | ORAL_TABLET | Freq: Three times a day (TID) | ORAL | Status: DC
  Administered 2023-02-17 – 2023-02-24 (×16): 10 mg via ORAL
  Filled 2023-02-16 (×18): qty 2

## 2023-02-16 MED ORDER — LORAZEPAM 2 MG/ML IJ SOLN
0.5000 mg | Freq: Four times a day (QID) | INTRAMUSCULAR | Status: DC | PRN
  Administered 2023-02-17: 0.5 mg via INTRAVENOUS
  Filled 2023-02-16 (×2): qty 1

## 2023-02-16 MED ORDER — ATORVASTATIN CALCIUM 20 MG PO TABS
80.0000 mg | ORAL_TABLET | Freq: Every day | ORAL | Status: DC
  Administered 2023-02-16 – 2023-02-23 (×8): 80 mg via ORAL
  Filled 2023-02-16 (×8): qty 4

## 2023-02-16 MED ORDER — TRAMADOL HCL 50 MG PO TABS
50.0000 mg | ORAL_TABLET | Freq: Four times a day (QID) | ORAL | Status: DC | PRN
  Administered 2023-02-23: 50 mg via ORAL
  Filled 2023-02-16: qty 1

## 2023-02-16 MED ORDER — INSULIN ASPART 100 UNIT/ML IJ SOLN
0.0000 [IU] | Freq: Three times a day (TID) | INTRAMUSCULAR | Status: DC
  Administered 2023-02-16: 1 [IU] via SUBCUTANEOUS
  Administered 2023-02-16: 3 [IU] via SUBCUTANEOUS
  Administered 2023-02-17: 1 [IU] via SUBCUTANEOUS
  Administered 2023-02-18 (×2): 2 [IU] via SUBCUTANEOUS
  Administered 2023-02-18: 5 [IU] via SUBCUTANEOUS
  Administered 2023-02-19: 2 [IU] via SUBCUTANEOUS
  Administered 2023-02-19: 1 [IU] via SUBCUTANEOUS
  Administered 2023-02-19 – 2023-02-20 (×2): 7 [IU] via SUBCUTANEOUS
  Administered 2023-02-20: 2 [IU] via SUBCUTANEOUS
  Administered 2023-02-21: 5 [IU] via SUBCUTANEOUS
  Administered 2023-02-21: 2 [IU] via SUBCUTANEOUS
  Administered 2023-02-22 (×2): 3 [IU] via SUBCUTANEOUS
  Administered 2023-02-23: 7 [IU] via SUBCUTANEOUS
  Administered 2023-02-23: 2 [IU] via SUBCUTANEOUS
  Administered 2023-02-24: 1 [IU] via SUBCUTANEOUS
  Administered 2023-02-24: 2 [IU] via SUBCUTANEOUS
  Filled 2023-02-16 (×19): qty 1

## 2023-02-16 NOTE — Assessment & Plan Note (Signed)
-   We will continue Neurontin. ?

## 2023-02-16 NOTE — ED Notes (Signed)
Informed RN Kayla via chat/ Pt has bed assigned

## 2023-02-16 NOTE — Assessment & Plan Note (Signed)
-   We will continue PPI therapy 

## 2023-02-16 NOTE — Progress Notes (Signed)
Brief hospitalist update note.  This is a nonbillable note.  Please see same-day H&P from Dr. Arville Care for full billable details.  Briefly, this is a 72 year old African-American male with history significant for hypertension, orthostatic hypotension, hyperlipidemia, diabetes who presents with acute onset of recurrent syncope.  Attempt associated with hypoglycemia.  Patient states that he fell a few days prior to admission and hit his head.  He was seen in his primary care clinic at [facility] on the day of admission.  He was sent from there through the emergency room for imaging survey.  CT head demonstrates intraventricular hematoma.  Follow-up CT 6 hours post showed stability of bleed.  This is potentially posttraumatic after syncopal events.  Case was discussed with neurosurgery.  No surgical intervention warranted.  No further imaging warranted.  I suspect that his syncopal events may be related to underlying orthostatic hypotension.  On a previous admission he was requiring midodrine and Florinef abdominal binder and TED hose and for reasons that are unclear all of these have subsequently disappeared from his home medication reconciliation.  Patient was also noted to be aggressive and combative with staff.  Security called to bedside.  Patient initially threatened to leave AGAINST MEDICAL ADVICE but's was convinced not to by his significant other.  Plan: Transferred to medical floor Twice daily orthostatics Frequent neurochecks PT OT evaluations  Lolita Patella MD   No charge

## 2023-02-16 NOTE — Assessment & Plan Note (Signed)
-   The patient be placed on supplemental coverage with NovoLog with resistant protocol. - We will continue basal coverage.

## 2023-02-16 NOTE — Progress Notes (Signed)
PHARMACIST - PHYSICIAN COMMUNICATION  CONCERNING: IV to Oral Route Change Policy  RECOMMENDATION: This patient is receiving pantoprazole by the intravenous route.  Based on criteria approved by the Pharmacy and Therapeutics Committee, the intravenous medication(s) is/are being converted to the equivalent oral dose form(s).  DESCRIPTION: These criteria include: The patient is eating (either orally or via tube) and/or has been taking other orally administered medications for a least 24 hours The patient has no evidence of active gastrointestinal bleeding or impaired GI absorption (gastrectomy, short bowel, patient on TNA or NPO).  If you have questions about this conversion, please contact the Pharmacy Department   Tressie Ellis, [facility] 02/16/2023 11:17 AM

## 2023-02-16 NOTE — Assessment & Plan Note (Signed)
-   This could be partly pseudohyponatremia from hyperglycemia. - Will monitor sodium levels with hydration.

## 2023-02-16 NOTE — TOC Initial Note (Signed)
Transition of Care [facility]) - Initial/Assessment Note    Patient Details  Name: Nicholas Weiss MRN: [id number] Date of Birth: 23-Sep-1950  Transition of Care [facility]) CM/SW Contact:    Truddie Hidden, RN Phone Number: 02/16/2023, 4:01 PM  Clinical Narrative:                 RA completed  Admitted for: intracerebral hemorrhage    Admitted from: Home. Lives with SO Transportation: drives himself. SO will transport home at discharge.  Pharmacy: [facility] [address] Current home health/prior home health/DME: [facility] HH: Patient states he active with [facility].   Patient stated he had an appointment with [facility] today. Sent a message to [place] from [place]. Per [place] patient was active 6 months ago. Per Adelina Mings at [facility] patient is not active.      Expected Discharge Plan: Home/Self Care Barriers to Discharge: Continued Medical Work up   Patient Goals and CMS Choice Patient states their goals for this hospitalization and ongoing recovery are:: Home          Expected Discharge Plan and Services       Living arrangements for the past 2 months: Single Family Home                           HH Arranged: PT HH Agency: [facility] Date HH Agency Contacted: 02/16/23 Time HH Agency Contacted: 1559 Representative spoke with at [facility] Agency: [facility]  Prior Living Arrangements/Services Living arrangements for the past 2 months: Single Family Home Lives with:: Significant Other Patient language and need for interpreter reviewed:: Yes Do you feel safe going back to the place where you live?: Yes      Need for Family Participation in Patient Care: Yes (Comment) Care giver support system in place?: Yes (comment) Current home services: Home PT Criminal Activity/Legal Involvement Pertinent to Current Situation/Hospitalization: No - Comment as needed  Activities of Daily Living   ADL Screening (condition at time of admission) Independently  performs ADLs?: Yes (appropriate for developmental age) Is the patient deaf or have difficulty hearing?: No Does the patient have difficulty seeing, even when wearing glasses/contacts?: No Does the patient have difficulty concentrating, remembering, or making decisions?: No  Permission Sought/Granted                  Emotional Assessment Appearance:: Appears stated age Attitude/Demeanor/Rapport: Gracious, Engaged Affect (typically observed): Accepting Orientation: : Oriented to Self, Oriented to Place, Oriented to  Time, Oriented to Situation Alcohol / Substance Use: Not Applicable Psych Involvement: No (comment)  Admission diagnosis:  Brain bleed (HCC) [I61.9] ICH (intracerebral hemorrhage) (HCC) [I61.9] Closed fracture of multiple ribs of right side, initial encounter [S22.41XA] Syncope, unspecified syncope type [R55] Patient Active Problem List   Diagnosis Date Noted   ICH (intracerebral hemorrhage) (HCC) 02/16/2023   Uncontrolled diabetes mellitus with hyperglycemia (HCC) 02/16/2023   GERD without esophagitis 02/16/2023   Essential hypertension 02/16/2023   Diabetic neuropathy (HCC) 02/16/2023   Brain bleed (HCC) 02/16/2023   Myoclonus 01/15/2023   Hypothermia 12/29/2022   Dizziness 11/13/2022   Substance abuse (HCC) 09/18/2022   History of GI bleed 09/18/2022   H/O orthostatic hypotension 09/18/2022   Uncontrolled type 1 diabetes mellitus with hyperglycemia, with long-term current use of insulin (HCC) 08/26/2022   Postural dizziness with presyncope 08/18/2022   Near syncope 08/18/2022   Closed fracture of shaft of left humerus 06/18/2022   Malnutrition of moderate degree  06/10/2022   Hypotension 06/10/2022   GI bleed 06/06/2022   Subdural hematoma (HCC) 05/15/2022   Falls 05/15/2022   Shoulder pain, left 05/15/2022   History of amputation of left great toe (HCC) 05/04/2022   Hypophosphatemia 04/28/2022   Protein-calorie malnutrition, severe 04/24/2022    Diabetic ulcer of toe associated with diabetes mellitus due to underlying condition, with fat layer exposed (HCC) 04/24/2022   Streptococcal bacteremia 04/24/2022   Leukopenia 04/24/2022   History of multiple myeloma 04/24/2022   Sympathotonic orthostatic hypotension 04/24/2022   Weight loss 04/23/2022   Osteomyelitis of left foot (HCC) 04/22/2022   DKA, type 1 (HCC) 04/22/2022   Dehydration 04/08/2022   Diabetic ketoacidosis without coma associated with type 1 diabetes mellitus (HCC) 04/07/2022   Hyperkalemia 04/07/2022   DKA (diabetic ketoacidosis) (HCC) 04/07/2022   Traumatic pneumothorax 02/18/2022   Multiple fractures of ribs, left side, initial encounter for closed fracture 02/18/2022   Pneumothorax, closed, traumatic 02/18/2022   Benign prostatic hyperplasia with nocturia 02/12/2022   Stroke (cerebrum) (HCC) 11/07/2021   Multiple myeloma (HCC) 09/29/2021   Multiple myeloma not having achieved remission (HCC) 09/29/2021   Hyponatremia 08/18/2021   COVID-19 virus infection 08/13/2021   History of CVA (cerebrovascular accident) 08/13/2021   Syncope and collapse, recurrent 08/09/2021   Hepatic lesion 08/05/2021   Dyslipidemia 05/08/2021   History of substance abuse (HCC) 12/09/2020   Acute metabolic encephalopathy 12/02/2020   Cocaine abuse (HCC) 11/30/2020   Symptomatic anemia 11/08/2020   Muscle twitching 09/12/2020   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   Pre-syncope 08/01/2020   Heart block AV first degree 08/01/2020   Anemia of chronic kidney failure, stage 3 (moderate) (HCC) 05/28/2020   PUD (peptic ulcer disease)    Esophageal dysphagia    Encounter for screening colonoscopy    Hyperglycemia 07/28/2019   Generalized weakness 07/28/2019   Hypoglycemia 04/27/2019   Unresponsiveness 04/27/2019   Lung nodule 04/07/2019   Type 1 diabetes mellitus (HCC)    Acute renal failure superimposed on stage 3a chronic kidney disease (HCC)    Hypertensive urgency 04/05/2019    AKI (acute kidney injury) (HCC) 04/05/2019   CKD (chronic kidney disease) stage 3, GFR 30-59 ml/min (HCC) 04/05/2019   Hypercholesterolemia 01/24/2016   Essential (primary) hypertension 06/07/2006   Type 1 diabetes mellitus with diabetic neuropathy, unspecified (HCC) 05/31/2006   PCP:  Marisue Ivan, MD Pharmacy:   [facility] [address] ([address]), [place] - [address] [place]nn [place]) [place] [address] Phone: [phone] Fax: [phone]  [facility] - [place], [place] - [address] [place] [address] Phone: [phone] Fax: [phone]  [facility] - [place], [place] - [address] [place] [address] Phone: [phone] Fax: [phone]  [facility] [address] [place] [address] Phone: [phone] Fax: [phone]     Social Determinants of Health (SDOH) Social History: SDOH Screenings   Food Insecurity: No Food Insecurity (02/16/2023)  Housing: Low Risk  (02/16/2023)  Transportation Needs: No Transportation Needs (02/16/2023)  Utilities: Not At Risk (02/16/2023)  Alcohol Screen: Low Risk  (10/02/2020)  Depression (PHQ2-9): Medium Risk (10/02/2020)  Financial Resource Strain: Low Risk  (01/26/2023)   Received from [facility]  Physical Activity: Insufficiently Active (01/01/2020)  Social Connections: Moderately Isolated (01/01/2020)  Stress: No Stress Concern Present (01/01/2020)  Tobacco Use: Low Risk  (02/15/2023)  Recent Concern: Tobacco Use - Medium  Risk (02/15/2023)   Received from [facility]   SDOH Interventions:     Readmission Risk Interventions    02/16/2023    3:56 PM 08/24/2022   10:57 AM 05/20/2022   11:41 AM  Readmission Risk Prevention Plan  Transportation Screening Complete Complete Complete  Medication Review Oceanographer) Complete Complete  Complete  PCP or Specialist appointment within 3-5 days of discharge Complete Complete Complete  HRI or Home Care Consult Complete Complete Complete  SW Recovery Care/Counseling Consult Not Complete    Palliative Care Screening Not Applicable  Complete  Skilled Nursing Facility Complete Complete Complete

## 2023-02-16 NOTE — Assessment & Plan Note (Signed)
-   We will continue anti-hypertensive therapy. 

## 2023-02-16 NOTE — Assessment & Plan Note (Signed)
-   The patient will be placed in the stepdown unit bed. - Will follow ICH protocol. - Neurosurgery consult will be obtained. - Dr. Katrinka Blazing was notified about the patient. - Neurology consult will be obtained. - I notified Dr. Selina Cooley about the patient.
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
## 2023-02-16 NOTE — Consult Note (Signed)
Consulting Department:  Intensive care unit  Primary Physician:  Marisue Ivan, MD  Chief Complaint: IVH  History of Present Illness: 02/16/2023 Nicholas Weiss is a 72 y.o. male who presents with the chief complaint of syncope.  He has a history of orthostatic hypotension.  Was seen in the hospital previously for this issue.  He was found passed out and brought to the emergency department.  Found to have a right frontal horn IVH which was stable on imaging.  He is admitted to the ICU and plans for being moved out to the general care status.  No headaches, no weakness, no tingling, no seizures.  No new neurologic deficits.  Review of Systems:  A 10 point review of systems is negative, except for the pertinent positives and negatives detailed in the HPI.  Past Medical History: Past Medical History:  Diagnosis Date   Acute metabolic encephalopathy 12/02/2020   DKA (diabetic ketoacidoses) 04/06/2016   Hypercholesteremia    Hypertension     Past Surgical History: Past Surgical History:  Procedure Laterality Date   AMPUTATION TOE Left 04/25/2022   Procedure: LEFT GREAT TOE AMPUTATION AND LEFT PARTIAL 2ND TOE AMPUTATION;  Surgeon: Felecia Shelling, DPM;  Location: [facility] ORS;  Service: Podiatry;  Laterality: Left;   COLONOSCOPY WITH PROPOFOL N/A 02/01/2020   Procedure: COLONOSCOPY WITH PROPOFOL;  Surgeon: Toney Reil, MD;  Location: [facility] ENDOSCOPY;  Service: Gastroenterology;  Laterality: N/A;   ESOPHAGOGASTRODUODENOSCOPY  02/01/2020   Procedure: ESOPHAGOGASTRODUODENOSCOPY (EGD);  Surgeon: Toney Reil, MD;  Location: [facility] ENDOSCOPY;  Service: Gastroenterology;;   ESOPHAGOGASTRODUODENOSCOPY (EGD) WITH PROPOFOL N/A 06/07/2022   Procedure: ESOPHAGOGASTRODUODENOSCOPY (EGD) WITH PROPOFOL;  Surgeon: Kerin Salen, MD;  Location: [facility] ENDOSCOPY;  Service: Gastroenterology;  Laterality: N/A;   KNEE SURGERY Right    Torn meniscus   KNEE SURGERY Left     Allergies: Allergies as of  02/15/2023 - Review Complete 02/15/2023  Allergen Reaction Noted   Penicillins Other (See Comments) 10/16/2014    Medications:  Current Facility-Administered Medications:    [START ON 02/17/2023]  stroke: early stages of recovery book, , Does not apply, Once, Mansy, Jan A, MD   acetaminophen (TYLENOL) tablet 650 mg, 650 mg, Oral, Q4H PRN **OR** acetaminophen (TYLENOL) 160 MG/5ML solution 650 mg, 650 mg, Per Tube, Q4H PRN **OR** acetaminophen (TYLENOL) suppository 650 mg, 650 mg, Rectal, Q4H PRN, Mansy, Jan A, MD   atorvastatin (LIPITOR) tablet 80 mg, 80 mg, Oral, QHS, Mansy, Jan A, MD   Chlorhexidine Gluconate Cloth 2 % PADS 6 each, 6 each, Topical, Daily, Mansy, Jan A, MD, 6 each at 02/16/23 0545   feeding supplement (ENSURE ENLIVE / ENSURE PLUS) liquid 237 mL, 237 mL, Oral, BID BM, Sreenath, Sudheer B, MD   gabapentin (NEURONTIN) capsule 300 mg, 300 mg, Oral, BID, Mansy, Jan A, MD, 300 mg at 02/16/23 1610   hydrALAZINE (APRESOLINE) tablet 25 mg, 25 mg, Oral, TID, Mansy, Jan A, MD, 25 mg at 02/16/23 9604   insulin aspart (novoLOG) injection 0-5 Units, 0-5 Units, Subcutaneous, QHS, Sreenath, Sudheer B, MD   insulin aspart (novoLOG) injection 0-9 Units, 0-9 Units, Subcutaneous, TID WC, Sreenath, Sudheer B, MD   insulin glargine-yfgn (SEMGLEE) injection 5 Units, 5 Units, Subcutaneous, Daily, Mansy, Jan A, MD   multivitamin with minerals tablet 1 tablet, 1 tablet, Oral, Daily, Mansy, Jan A, MD, 1 tablet at 02/16/23 0915   ondansetron (ZOFRAN) injection 4 mg, 4 mg, Intravenous, Q4H PRN, Mansy, Jan A, MD   pantoprazole (PROTONIX) injection  40 mg, 40 mg, Intravenous, QHS, Mansy, Jan A, MD   senna-docusate (Senokot-S) tablet 1 tablet, 1 tablet, Oral, BID, Mansy, Jan A, MD, 1 tablet at 02/16/23 0915   traMADol (ULTRAM) tablet 50 mg, 50 mg, Oral, Q6H PRN, Mansy, Jan A, MD   traZODone (DESYREL) tablet 25 mg, 25 mg, Oral, QHS PRN, Mansy, Vernetta Honey, MD   Social History: Social History   Tobacco Use    Smoking status: Never   Smokeless tobacco: Never  Vaping Use   Vaping status: Never Used  Substance Use Topics   Alcohol use: Not Currently    Alcohol/week: 0.0 - 1.0 standard drinks of alcohol    Comment: "once every 2 months"   Drug use: Yes    Types: Marijuana, "Crack" cocaine    Comment: last week    Family Medical History: Family History  Problem Relation Age of Onset   Heart attack Father    Hypertension Sister    Cancer Sister     Physical Examination: Vitals:   02/16/23 0800 02/16/23 0813  BP: (!) 155/95   Pulse: 76   Resp: 17   Temp:  97.8 F (36.6 C)  SpO2: 99%      General: Patient is well developed, well nourished, calm, collected, and in no apparent distress.  NEUROLOGICAL:  General: In no acute distress.   Awake, alert, oriented to person, place, and time.  Pupils equal round and reactive to light.  Full Facial tone is symmetric.  Tongue protrusion is midline.  Bilateral upper extremities are full strength proximally and distally.  There is no pronator drift.  Language is conversant.  GCS:15   Bilateral upper and lower extremity sensation is intact to light touch.  Imaging: Narrative & Impression  CLINICAL DATA:  Follow-up hemorrhage   EXAM: CT HEAD WITHOUT CONTRAST   TECHNIQUE: Contiguous axial images were obtained from the base of the skull through the vertex without intravenous contrast.   RADIATION DOSE REDUCTION: This exam was performed according to the departmental dose-optimization program which includes automated exposure control, adjustment of the mA and/or kV according to patient size and/or use of iterative reconstruction technique.   COMPARISON:  02/15/2023   FINDINGS: Brain: Again seen is the blood within the anterior horn of the right lateral ventricle, unchanged. No new areas of hemorrhage. No hydrocephalus. No acute infarct. No mass effect or midline shift.   Vascular: No hyperdense vessel or unexpected calcification.    Skull: No acute calvarial abnormality.   Sinuses/Orbits: No acute findings   Other: None   IMPRESSION: Stable hemorrhage in the anterior horn of the right lateral ventricle.     Electronically Signed   By: Charlett Nose M.D.   On: 02/16/2023 03:49      Narrative & Impression  CLINICAL DATA:  Hemorrhage in the right frontal horn on CT head   EXAM: CT ANGIOGRAPHY HEAD AND NECK WITH AND WITHOUT CONTRAST   TECHNIQUE: Multidetector CT imaging of the head and neck was performed using the standard protocol during bolus administration of intravenous contrast. Multiplanar CT image reconstructions and MIPs were obtained to evaluate the vascular anatomy. Carotid stenosis measurements (when applicable) are obtained utilizing NASCET criteria, using the distal internal carotid diameter as the denominator.   RADIATION DOSE REDUCTION: This exam was performed according to the departmental dose-optimization program which includes automated exposure control, adjustment of the mA and/or kV according to patient size and/or use of iterative reconstruction technique.   CONTRAST:  75mL OMNIPAQUE IOHEXOL 350  MG/ML SOLN   COMPARISON:  No prior CTA available, correlation is made with 08/10/2021 MRA head and neck and 02/05/2023 CT head   FINDINGS: CT HEAD FINDINGS   For noncontrast findings, please see same day CT head. No evidence of active extravasation in right frontal horn hemorrhage.   Multiple dural based enhancing lesions, the largest of which measures up to 9 mm (series 7, image 69 and 107), with smaller lesions along the left cerebral convexity (series 7, images 86, 96, and 98) and falx (series 7, image 74, for example), which correlate with calcified lesions on the prior head CT, most likely small meningiomas, without significant mass effect.   CTA NECK FINDINGS   Aortic arch: Standard branching. Imaged portion shows no evidence of aneurysm or dissection. No significant  stenosis of the major arch vessel origins. Mild aortic atherosclerosis.   Right carotid system: 50% stenosis in the proximal right ICA, just distal to the bifurcation (series 7, image 221). No evidence of dissection.   Left carotid system: 50% stenosis in the proximal left ICA, just distal to the bifurcation (series 7, image 210). No evidence of dissection.   Vertebral arteries: Severe stenosis at the origin of right vertebral artery (series 9, image 62). The right vertebral artery is otherwise patent to the skull base. The left vertebral artery is patent from its origin to the skull base without significant stenosis. No evidence of dissection.   Skeleton: No acute osseous abnormality. Degenerative changes in the cervical spine. Edentulous with calcifications but   Other neck: No acute finding.   Upper chest: No focal pulmonary opacity or pleural effusion.   Review of the MIP images confirms the above findings   CTA HEAD FINDINGS   Anterior circulation: Both internal carotid arteries are patent to the termini, without significant stenosis.   A1 segments patent, hypoplastic on the right. Normal anterior communicating artery. Anterior cerebral arteries are patent to their distal aspects without significant stenosis.   No M1 stenosis or occlusion. MCA branches perfused to their distal aspects without significant stenosis.   Posterior circulation: The bilateral V4 segments primarily supply the PICAs, with diminutive distal V4 segments patent to vertebrobasilar junction.   Basilar patent to its distal aspect without significant stenosis. Superior cerebellar arteries patent proximally.   Patent, hypoplastic P1 segments. Near fetal origin of the bilateral PCAs from the posterior communicating arteries. PCAs perfused to their distal aspects without significant stenosis.   Venous sinuses: As permitted by contrast timing, patent.   Anatomic variants: Near fetal origin of the  bilateral PCAs.   No evidence of aneurysm or vascular malformation.   Review of the MIP images confirms the above findings   IMPRESSION: 1. No evidence of active extravasation in the right frontal horn hemorrhage. 2. No intracranial large vessel occlusion or significant stenosis. 3. 50% stenosis in the proximal bilateral ICAs, just distal to the bifurcations. 4. Severe stenosis at the origin of the right vertebral artery. 5. Multiple dural based enhancing lesions, the largest of which measures up to 9 mm, which correlate with calcified lesions on the prior head CT, most likely small meningiomas, without significant mass effect. 6. Aortic atherosclerosis.   Aortic Atherosclerosis (ICD10-I70.0).     Electronically Signed   By: Wiliam Ke M.D.   On: 02/16/2023 00:35     I have personally reviewed the images and agree with the above interpretation.  No evidence of worsening hydrocephalus.  Stable ventricular anatomy overall.  Stable intraventricular clot.  Labs:  Latest Ref Rng & Units 02/16/2023    8:27 AM 02/15/2023    6:26 PM 02/12/2023   12:10 PM  CBC  WBC 4.0 - 10.5 K/uL 3.1  3.2  3.9   Hemoglobin 13.0 - 17.0 g/dL 9.6  9.1  9.5   Hematocrit 39.0 - 52.0 % 29.7  27.9  29.5   Platelets 150 - 400 K/uL 225  226  214        Assessment and Plan: Mr. Kraner is a pleasant 72 y.o. male with recent episodes of syncope.  He has been admitted for orthostatic hypotension in the past.  Has been treated and currently managed.  He does not remember whether or not he hit his head.  Not having any headaches.  No seizures.  No numbness weakness or tingling.  No new neurologic symptoms.  He states that this is not happened before.  On imaging he demonstrates no aneurysmal formation, CT scans demonstrate stable intraventricular hematoma, potentially posttraumatic after his syncope episode.  At this point no further imaging is needed.  He does not need to follow-up in neurosurgery clinic  that should be self-limited.  Should he have any worries or concerns can come to see [place] in clinic with repeat head CT.  At this point we will continue to follow if needed.    Lovenia Kim, MD/MSCR Dept. of Neurosurgery

## 2023-02-16 NOTE — H&P (Addendum)
Nicholas Weiss   PATIENT NAME: Nicholas Weiss    MR#:  [id number]  DATE OF BIRTH:  1950-07-17  DATE OF ADMISSION:  02/15/2023  PRIMARY CARE PHYSICIAN: Marisue Ivan, MD   Patient is coming from: Home   REQUESTING/REFERRING PHYSICIAN: Ward, Layla Maw, DO  CHIEF COMPLAINT:   Chief Complaint  Patient presents with   Loss of Consciousness    HISTORY OF PRESENT ILLNESS:  Nicholas Weiss is a 72 y.o. African-American male with medical history significant for hypertension, dyslipidemia and type 2 diabetes mellitus, who presented to the emergency room with acute onset of recurrent syncope.  It has been occasionally associated with hypoglycemia.  Few days ago he fell and hit his head yesterday he had a syncopal episode without injuries.  He denied any headache or dizziness or blurred vision.  No paresthesias or focal muscle weakness.  He reported having a rib fracture from recent fall.  He has been having right-sided chest/rib pain without palpitations.  No cough or wheezing or dyspnea.  No dysuria, oliguria or hematuria or flank pain.  No bleeding diathesis.  ED Course: When he came to the ER, BP was 171/99 with otherwise normal vital signs.  Labs revealed hyponatremia of 129 and hyperglycemia of 413 with a BUN of 39 creatinine 2.17 comparable to previous levels and high sensitive troponin I was 18.  CBC showed mild leukopenia of 3.2 and anemia close to baseline.  EKG as reviewed by me : Normal sinus rhythm with rate of 81 without acute findings.  QTc was 548 MS. Imaging: On 02/12/2023: Rib x-ray showed minimally displaced fracture of the posterolateral right 10th rib with no acute cardiopulmonary disease.  Noncontrasted CT scan and C-spine CT revealed the following: 1. Small amount of hyperdense hemorrhage in the right frontal horn, which is new from the 02/12/2023 exam, of indeterminate etiology. No hydrocephalus. 2. No acute fracture or traumatic listhesis in the cervical  spine.  - Head  and neck CTA scan revealed the following: 1. No evidence of active extravasation in the right frontal horn hemorrhage. 2. No intracranial large vessel occlusion or significant stenosis. 3. 50% stenosis in the proximal bilateral ICAs, just distal to the bifurcations. 4. Severe stenosis at the origin of the right vertebral artery. 5. Multiple dural based enhancing lesions, the largest of which measures up to 9 mm, which correlate with calcified lesions on the prior head CT, most likely small meningiomas, without significant mass effect. 6. Aortic atherosclerosis.  6 hours later repeat head CT scan without contrast showed stable hemorrhage in the anterior horn of the right lateral ventricle.  The patient was given 5 mg oxycodone, 5 units of subcutaneous NovoLog and 1 L bolus of IV normal saline.  Dr. Katrinka Blazing with neurosurgery was notified about the patient.  He will be admitted to a stepdown unit bed for further evaluation and management. PAST MEDICAL HISTORY:   Past Medical History:  Diagnosis Date   Acute metabolic encephalopathy 12/02/2020   DKA (diabetic ketoacidoses) 04/06/2016   Hypercholesteremia    Hypertension     PAST SURGICAL HISTORY:   Past Surgical History:  Procedure Laterality Date   AMPUTATION TOE Left 04/25/2022   Procedure: LEFT GREAT TOE AMPUTATION AND LEFT PARTIAL 2ND TOE AMPUTATION;  Surgeon: Felecia Shelling, DPM;  Location: [facility] ORS;  Service: Podiatry;  Laterality: Left;   COLONOSCOPY WITH PROPOFOL N/A 02/01/2020   Procedure: COLONOSCOPY WITH PROPOFOL;  Surgeon: Toney Reil, MD;  Location: [facility] ENDOSCOPY;  Service: Gastroenterology;  Laterality: N/A;   ESOPHAGOGASTRODUODENOSCOPY  02/01/2020   Procedure: ESOPHAGOGASTRODUODENOSCOPY (EGD);  Surgeon: Toney Reil, MD;  Location: [facility] ENDOSCOPY;  Service: Gastroenterology;;   ESOPHAGOGASTRODUODENOSCOPY (EGD) WITH PROPOFOL N/A 06/07/2022   Procedure: ESOPHAGOGASTRODUODENOSCOPY (EGD) WITH  PROPOFOL;  Surgeon: Kerin Salen, MD;  Location: [facility] ENDOSCOPY;  Service: Gastroenterology;  Laterality: N/A;   KNEE SURGERY Right    Torn meniscus   KNEE SURGERY Left     SOCIAL HISTORY:   Social History   Tobacco Use   Smoking status: Never   Smokeless tobacco: Never  Substance Use Topics   Alcohol use: Not Currently    Alcohol/week: 0.0 - 1.0 standard drinks of alcohol    Comment: "once every 2 months"    FAMILY HISTORY:   Family History  Problem Relation Age of Onset   Heart attack Father    Hypertension Sister    Cancer Sister     DRUG ALLERGIES:   Allergies  Allergen Reactions   Penicillins Other (See Comments)    Childhood allergy -tolerated amoxil 03/2022 Unknown reaction Has patient had a PCN reaction causing immediate rash, facial/tongue/throat swelling, SOB or lightheadedness with hypotension: No Has patient had a PCN reaction causing severe rash involving mucus membranes or skin necrosis: No Has patient had a PCN reaction that required hospitalization No Has patient had a PCN reaction occurring within the last 10 years: No If all of the above answers are "NO", then may proceed with Cephalosporin use.     REVIEW OF SYSTEMS:   ROS As per history of present illness. All pertinent systems were reviewed above. Constitutional, HEENT, cardiovascular, respiratory, GI, GU, musculoskeletal, neuro, psychiatric, endocrine, integumentary and hematologic systems were reviewed and are otherwise negative/unremarkable except for positive findings mentioned above in the HPI.   MEDICATIONS AT HOME:   Prior to Admission medications   Medication Sig Start Date End Date Taking? Authorizing Provider  acetaminophen (TYLENOL) 500 MG tablet Take 1,000 mg by mouth daily as needed for mild pain or moderate pain.   Yes [provider]  apixaban (ELIQUIS) 5 MG TABS tablet Take 5 mg by mouth 2 (two) times daily. 02/01/23  Yes [provider]  ascorbic acid (VITAMIN  C) 500 MG tablet Take 1 tablet (500 mg total) by mouth daily. 04/09/22  Yes Arnetha Courser, MD  atorvastatin (LIPITOR) 80 MG tablet Take 1 tablet (80 mg total) by mouth at bedtime. Hold while taking Paxlovid 04/08/22  Yes Arnetha Courser, MD  docusate sodium (COLACE) 100 MG capsule Take 100 mg by mouth daily as needed for mild constipation.   Yes [provider]  gabapentin (NEURONTIN) 300 MG capsule Take 300 mg by mouth 2 (two) times daily. 01/26/23 02/25/23 Yes [provider]  hydrALAZINE (APRESOLINE) 25 MG tablet Take 25 mg by mouth 3 (three) times daily. 07/11/22  Yes [provider]  insulin degludec (TRESIBA FLEXTOUCH) 100 UNIT/ML FlexTouch Pen Inject 5 Units into the skin daily. Home med. 11/16/22  Yes Darlin Priestly, MD  insulin lispro (HUMALOG) 100 UNIT/ML injection Inject 70-120 Units into the skin.   Yes [provider]  Multiple Vitamin (MULTIVITAMIN WITH MINERALS) TABS tablet Take 1 tablet by mouth daily. 04/09/22  Yes Arnetha Courser, MD  ondansetron (ZOFRAN) 8 MG tablet Take 8 mg by mouth daily as needed for nausea or vomiting.   Yes [provider]  traMADol (ULTRAM) 50 MG tablet Take 1 tablet (50 mg total) by mouth every 6 (six) hours as needed. 02/12/23  02/12/24 Yes Jene Every, MD  acyclovir (ZOVIRAX) 400 MG tablet Take 1 tablet (400 mg total) by mouth 2 (two) times daily. Patient not taking: Reported on 02/16/2023 08/06/22   Creig Hines, MD  aspirin EC 81 MG tablet Take 81 mg by mouth in the morning. Patient not taking: Reported on 02/16/2023    [provider]  Glucagon (BAQSIMI ONE PACK) 3 MG/DOSE POWD Place 3 mg into the nose once as needed for up to 1 dose (for low sugar). Patient not taking: Reported on 02/16/2023 07/22/22   Concha Se, MD  levETIRAcetam (KEPPRA) 500 MG tablet Take 1 tablet (500 mg total) by mouth 2 (two) times daily. Patient not taking: Reported on 02/16/2023 01/15/23   Henreitta Leber, MD  lisinopril  (ZESTRIL) 2.5 MG tablet Take by mouth. Patient not taking: Reported on 02/16/2023 11/20/22   [provider]  NOVOLOG 100 UNIT/ML injection Sliding Scale: Glucose 70 - 120: 0 units  Glucose 121 - 150: 1 unit  Glucose 151 - 200: 2 units Glucose 201 - 250: 3 units  Glucose 251 - 300: 5 units  Glucose 301 - 350: 7 units  Glucose 351 - 400: 9 units  Glucose > 400: call your docotr Patient not taking: Reported on 02/16/2023 06/12/22   Jerald Kief, MD  pantoprazole (PROTONIX) 40 MG tablet Take 1 tablet (40 mg total) by mouth 2 (two) times daily. Take twice daily for 2 months then decrease to once daily Patient not taking: Reported on 02/16/2023 09/20/22   Loyce Dys, MD  pomalidomide (POMALYST) 3 MG capsule Take 1 capsule (3 mg total) by mouth daily. Take for 21 days, then hold for 7 days. Repeat every 28 days Patient not taking: Reported on 02/16/2023 01/15/23   Creig Hines, MD      VITAL SIGNS:  Blood pressure (!) 141/92, pulse 88, temperature 97.6 F (36.4 C), temperature source Oral, resp. rate 20, height 5\' 10"  (1.778 m), weight 64 kg, SpO2 100%.  PHYSICAL EXAMINATION:  Physical Exam  GENERAL:  72 y.o.-year-old African American male patient lying in the bed with no acute distress.  EYES: Pupils equal, round, reactive to light and accommodation. No scleral icterus. Extraocular muscles intact.  HEENT: Head atraumatic, normocephalic. Oropharynx and nasopharynx clear.  NECK:  Supple, no jugular venous distention. No thyroid enlargement, no tenderness.  LUNGS: Normal breath sounds bilaterally, no wheezing, rales,rhonchi or crepitation. No use of accessory muscles of respiration.  CARDIOVASCULAR: Regular rate and rhythm, S1, S2 normal. No murmurs, rubs, or gallops.  ABDOMEN: Soft, nondistended, nontender. Bowel sounds present. No organomegaly or mass.  EXTREMITIES: No pedal edema, cyanosis, or clubbing.  NEUROLOGIC: Cranial nerves II through XII are intact. Muscle strength  5/5 in all extremities. Sensation intact. Gait not checked.  PSYCHIATRIC: The patient is alert and oriented x 3.  Normal affect and good eye contact. SKIN: No obvious rash, lesion, or ulcer.   LABORATORY PANEL:   CBC Recent Labs  Lab 02/15/23 1826  WBC 3.2*  HGB 9.1*  HCT 27.9*  PLT 226   ------------------------------------------------------------------------------------------------------------------  Chemistries  Recent Labs  Lab 02/15/23 1826  NA 129*  K 5.1  CL 99  CO2 22  GLUCOSE 413*  BUN 39*  CREATININE 2.17*  CALCIUM 8.6*  MG 2.2  AST 15  ALT 15  ALKPHOS 46  BILITOT 0.5   ------------------------------------------------------------------------------------------------------------------  Cardiac Enzymes No results for input(s): "TROPONINI" in the last 168 hours. ------------------------------------------------------------------------------------------------------------------  RADIOLOGY:  CT  HEAD WO CONTRAST ( )  Result Date: 02/16/2023 CLINICAL DATA:  Follow-up hemorrhage EXAM: CT HEAD WITHOUT CONTRAST TECHNIQUE: Contiguous axial images were obtained from the base of the skull through the vertex without intravenous contrast. RADIATION DOSE REDUCTION: This exam was performed according to the departmental dose-optimization program which includes automated exposure control, adjustment of the mA and/or kV according to patient size and/or use of iterative reconstruction technique. COMPARISON:  02/15/2023 FINDINGS: Brain: Again seen is the blood within the anterior horn of the right lateral ventricle, unchanged. No new areas of hemorrhage. No hydrocephalus. No acute infarct. No mass effect or midline shift. Vascular: No hyperdense vessel or unexpected calcification. Skull: No acute calvarial abnormality. Sinuses/Orbits: No acute findings Other: None IMPRESSION: Stable hemorrhage in the anterior horn of the right lateral ventricle. Electronically Signed   By: Charlett Nose M.D.   On: 02/16/2023 03:49   CT ANGIO HEAD NECK W WO CM  Result Date: 02/16/2023 CLINICAL DATA:  Hemorrhage in the right frontal horn on CT head EXAM: CT ANGIOGRAPHY HEAD AND NECK WITH AND WITHOUT CONTRAST TECHNIQUE: Multidetector CT imaging of the head and neck was performed using the standard protocol during bolus administration of intravenous contrast. Multiplanar CT image reconstructions and MIPs were obtained to evaluate the vascular anatomy. Carotid stenosis measurements (when applicable) are obtained utilizing NASCET criteria, using the distal internal carotid diameter as the denominator. RADIATION DOSE REDUCTION: This exam was performed according to the departmental dose-optimization program which includes automated exposure control, adjustment of the mA and/or kV according to patient size and/or use of iterative reconstruction technique. CONTRAST:  75mL OMNIPAQUE IOHEXOL 350 MG/ML SOLN COMPARISON:  No prior CTA available, correlation is made with 08/10/2021 MRA head and neck and 02/05/2023 CT head FINDINGS: CT HEAD FINDINGS For noncontrast findings, please see same day CT head. No evidence of active extravasation in right frontal horn hemorrhage. Multiple dural based enhancing lesions, the largest of which measures up to 9 mm (series 7, image 69 and 107), with smaller lesions along the left cerebral convexity (series 7, images 86, 96, and 98) and falx (series 7, image 74, for example), which correlate with calcified lesions on the prior head CT, most likely small meningiomas, without significant mass effect. CTA NECK FINDINGS Aortic arch: Standard branching. Imaged portion shows no evidence of aneurysm or dissection. No significant stenosis of the major arch vessel origins. Mild aortic atherosclerosis. Right carotid system: 50% stenosis in the proximal right ICA, just distal to the bifurcation (series 7, image 221). No evidence of dissection. Left carotid system: 50% stenosis in the proximal  left ICA, just distal to the bifurcation (series 7, image 210). No evidence of dissection. Vertebral arteries: Severe stenosis at the origin of right vertebral artery (series 9, image 62). The right vertebral artery is otherwise patent to the skull base. The left vertebral artery is patent from its origin to the skull base without significant stenosis. No evidence of dissection. Skeleton: No acute osseous abnormality. Degenerative changes in the cervical spine. Edentulous with calcifications but Other neck: No acute finding. Upper chest: No focal pulmonary opacity or pleural effusion. Review of the MIP images confirms the above findings CTA HEAD FINDINGS Anterior circulation: Both internal carotid arteries are patent to the termini, without significant stenosis. A1 segments patent, hypoplastic on the right. Normal anterior communicating artery. Anterior cerebral arteries are patent to their distal aspects without significant stenosis. No M1 stenosis or occlusion. MCA branches perfused to their distal aspects without significant stenosis. Posterior circulation:  The bilateral V4 segments primarily supply the PICAs, with diminutive distal V4 segments patent to vertebrobasilar junction. Basilar patent to its distal aspect without significant stenosis. Superior cerebellar arteries patent proximally. Patent, hypoplastic P1 segments. Near fetal origin of the bilateral PCAs from the posterior communicating arteries. PCAs perfused to their distal aspects without significant stenosis. Venous sinuses: As permitted by contrast timing, patent. Anatomic variants: Near fetal origin of the bilateral PCAs. No evidence of aneurysm or vascular malformation. Review of the MIP images confirms the above findings IMPRESSION: 1. No evidence of active extravasation in the right frontal horn hemorrhage. 2. No intracranial large vessel occlusion or significant stenosis. 3. 50% stenosis in the proximal bilateral ICAs, just distal to the  bifurcations. 4. Severe stenosis at the origin of the right vertebral artery. 5. Multiple dural based enhancing lesions, the largest of which measures up to 9 mm, which correlate with calcified lesions on the prior head CT, most likely small meningiomas, without significant mass effect. 6. Aortic atherosclerosis. Aortic Atherosclerosis (ICD10-I70.0). Electronically Signed   By: Wiliam Ke M.D.   On: 02/16/2023 00:35   CT HEAD WO CONTRAST ( )  Result Date: 02/15/2023 CLINICAL DATA:  Recent fall, with subsequent syncopal event, headache and neck pain EXAM: CT HEAD WITHOUT CONTRAST CT CERVICAL SPINE WITHOUT CONTRAST TECHNIQUE: Multidetector CT imaging of the head and cervical spine was performed following the standard protocol without intravenous contrast. Multiplanar CT image reconstructions of the cervical spine were also generated. RADIATION DOSE REDUCTION: This exam was performed according to the departmental dose-optimization program which includes automated exposure control, adjustment of the mA and/or kV according to patient size and/or use of iterative reconstruction technique. COMPARISON:  02/12/2023 CT head and cervical spine FINDINGS: CT HEAD FINDINGS Brain: Small amount of hyperdense hemorrhage in the right frontal horn (series 2, image 11), which is new from the 02/12/2023 exam, of indeterminate etiology. No hydrocephalus. No evidence of parenchymal hemorrhage, subdural hemorrhage, or subarachnoid hemorrhage. No acute infarct, mass, mass effect, or midline shift. Vascular: No hyperdense vessel. Skull: Negative for fracture or focal lesion. Sinuses/Orbits: No acute finding. Other: The mastoid air cells are well aerated. CT CERVICAL SPINE FINDINGS Alignment: No traumatic listhesis. Straightening and mild reversal of the normal cervical lordosis. Trace anterolisthesis of C3 on C4 and trace retrolisthesis of C5 on C6 and C6 on C7. Skull base and vertebrae: No acute fracture or suspicious osseous  lesion. Soft tissues and spinal canal: No prevertebral fluid or swelling. No visible canal hematoma. Disc levels: Degenerative changes in the cervical spine.No high-grade spinal canal stenosis. Upper chest: For findings in the thorax, please see same day CT chest. IMPRESSION: 1. Small amount of hyperdense hemorrhage in the right frontal horn, which is new from the 02/12/2023 exam, of indeterminate etiology. No hydrocephalus. 2. No acute fracture or traumatic listhesis in the cervical spine. These results were called by telephone at the time of interpretation on 02/15/2023 at 10:07 pm to provider [facility] , who verbally acknowledged these results. Electronically Signed   By: Wiliam Ke M.D.   On: 02/15/2023 22:07   CT Cervical Spine Wo Contrast  Result Date: 02/15/2023 CLINICAL DATA:  Recent fall, with subsequent syncopal event, headache and neck pain EXAM: CT HEAD WITHOUT CONTRAST CT CERVICAL SPINE WITHOUT CONTRAST TECHNIQUE: Multidetector CT imaging of the head and cervical spine was performed following the standard protocol without intravenous contrast. Multiplanar CT image reconstructions of the cervical spine were also generated. RADIATION DOSE REDUCTION: This exam was performed according  to the departmental dose-optimization program which includes automated exposure control, adjustment of the mA and/or kV according to patient size and/or use of iterative reconstruction technique. COMPARISON:  02/12/2023 CT head and cervical spine FINDINGS: CT HEAD FINDINGS Brain: Small amount of hyperdense hemorrhage in the right frontal horn (series 2, image 11), which is new from the 02/12/2023 exam, of indeterminate etiology. No hydrocephalus. No evidence of parenchymal hemorrhage, subdural hemorrhage, or subarachnoid hemorrhage. No acute infarct, mass, mass effect, or midline shift. Vascular: No hyperdense vessel. Skull: Negative for fracture or focal lesion. Sinuses/Orbits: No acute finding. Other: The mastoid  air cells are well aerated. CT CERVICAL SPINE FINDINGS Alignment: No traumatic listhesis. Straightening and mild reversal of the normal cervical lordosis. Trace anterolisthesis of C3 on C4 and trace retrolisthesis of C5 on C6 and C6 on C7. Skull base and vertebrae: No acute fracture or suspicious osseous lesion. Soft tissues and spinal canal: No prevertebral fluid or swelling. No visible canal hematoma. Disc levels: Degenerative changes in the cervical spine.No high-grade spinal canal stenosis. Upper chest: For findings in the thorax, please see same day CT chest. IMPRESSION: 1. Small amount of hyperdense hemorrhage in the right frontal horn, which is new from the 02/12/2023 exam, of indeterminate etiology. No hydrocephalus. 2. No acute fracture or traumatic listhesis in the cervical spine. These results were called by telephone at the time of interpretation on 02/15/2023 at 10:07 pm to provider [facility] , who verbally acknowledged these results. Electronically Signed   By: Wiliam Ke M.D.   On: 02/15/2023 22:07   CT Chest Wo Contrast  Result Date: 02/15/2023 CLINICAL DATA:  Chest trauma and pain. EXAM: CT CHEST WITHOUT CONTRAST TECHNIQUE: Multidetector CT imaging of the chest was performed following the standard protocol without IV contrast. RADIATION DOSE REDUCTION: This exam was performed according to the departmental dose-optimization program which includes automated exposure control, adjustment of the mA and/or kV according to patient size and/or use of iterative reconstruction technique. COMPARISON:  Chest CT dated 02/17/2022. FINDINGS: Evaluation of this exam is limited in the absence of intravenous contrast. Cardiovascular: There is no cardiomegaly. Small pericardial effusion. Advanced 3 vessel coronary vascular calcification. Mild atherosclerotic calcification of the thoracic aorta. No aneurysmal dilatation. The central pulmonary arteries are grossly unremarkable on this noncontrast CT.  Mediastinum/Nodes: No hilar or mediastinal adenopathy. The esophagus is grossly unremarkable. No mediastinal fluid collection. Lungs/Pleura: Bibasilar linear atelectasis/scarring. Small left lower lobe subpleural calcified granuloma. No focal consolidation, pleural effusion or pneumothorax. The central airways are patent. Upper Abdomen: A 3 mm nonobstructing right renal upper pole calculus. Probable small cyst in the dome of the liver. Musculoskeletal: Osteopenia. Old compression fracture of superior endplate of L1. No acute osseous pathology. Acute fractures of the lateral right 8-10th ribs. IMPRESSION: 1. Acute fractures of the lateral right 8-10th ribs. No pneumothorax. 2. Advanced coronary vascular calcification. 3. A 3 mm nonobstructing right renal upper pole calculus. 4.  Aortic Atherosclerosis (ICD10-I70.0). Electronically Signed   By: Elgie Collard M.D.   On: 02/15/2023 22:01      IMPRESSION AND PLAN:  Assessment and Plan: * ICH (intracerebral hemorrhage) (HCC) - The patient will be placed in the stepdown unit bed. - Will follow ICH protocol. - Neurosurgery consult will be obtained. - Dr. Katrinka Blazing was notified about the patient. - Neurology consult will be obtained. - I notified Dr. Selina Cooley about the patient.  Uncontrolled diabetes mellitus with hyperglycemia (HCC) - The patient be placed on supplemental coverage with NovoLog with resistant  protocol. - We will continue basal coverage.  Essential hypertension - We will continue antihypertensive therapy.  Hyponatremia - This could be partly pseudohyponatremia from hyperglycemia. - Will monitor sodium levels with hydration.  Diabetic neuropathy (HCC) - We will continue Neurontin.  GERD without esophagitis - We will continue PPI therapy.   DVT prophylaxis: Lovenox.  Advanced Care Planning:  Code Status: full code.  Family Communication:  The plan of care was discussed in details with the patient (and family). I answered all  questions. The patient agreed to proceed with the above mentioned plan. Further management will depend upon hospital course. Disposition Plan: Back to previous home environment Consults called: Neurosurgery All the records are reviewed and case discussed with ED provider.  Status is: Inpatient   At the time of the admission, it appears that the appropriate admission status for this patient is inpatient.  This is judged to be reasonable and necessary in order to provide the required intensity of service to ensure the patient's safety given the presenting symptoms, physical exam findings and initial radiographic and laboratory data in the context of comorbid conditions.  The patient requires inpatient status due to high intensity of service, high risk of further deterioration and high frequency of surveillance required.  I certify that at the time of admission, it is my clinical judgment that the patient will require inpatient hospital care extending more than 2 midnights.                            Dispo: The patient is from: Home              Anticipated d/c is to: Home              Patient currently is not medically stable to d/c.              Difficult to place patient: No  Hannah Beat M.D on 02/16/2023 at 5:40 AM  [facility]   From 7 PM-7 AM, contact night-coverage [email or web address]  CC: Primary care physician; Marisue Ivan, MD

## 2023-02-16 NOTE — Plan of Care (Signed)
  Problem: Education: Goal: Knowledge of disease or condition will improve Outcome: Progressing Goal: Knowledge of secondary prevention will improve (MUST DOCUMENT ALL) Outcome: Progressing Goal: Knowledge of patient specific risk factors will improve Loraine Leriche N/A or DELETE if not current risk factor) Outcome: Progressing   Problem: Intracerebral Hemorrhage Tissue Perfusion: Goal: Complications of Intracerebral Hemorrhage will be minimized Outcome: Progressing   Problem: Coping: Goal: Will verbalize positive feelings about self Outcome: Progressing Goal: Will identify appropriate support needs Outcome: Progressing   Problem: Health Behavior/Discharge Planning: Goal: Ability to manage health-related needs will improve Outcome: Progressing Goal: Goals will be collaboratively established with patient/family Outcome: Progressing   Problem: Self-Care: Goal: Ability to participate in self-care as condition permits will improve Outcome: Progressing Goal: Verbalization of feelings and concerns over difficulty with self-care will improve Outcome: Progressing Goal: Ability to communicate needs accurately will improve Outcome: Progressing   Problem: Nutrition: Goal: Risk of aspiration will decrease Outcome: Progressing Goal: Dietary intake will improve Outcome: Progressing   Problem: Education: Goal: Knowledge of General Education information will improve Description: Including pain rating scale, medication(s)/side effects and non-pharmacologic comfort measures Outcome: Progressing   Problem: Health Behavior/Discharge Planning: Goal: Ability to manage health-related needs will improve Outcome: Progressing   Problem: Clinical Measurements: Goal: Ability to maintain clinical measurements within normal limits will improve Outcome: Progressing Goal: Will remain free from infection Outcome: Progressing Goal: Diagnostic test results will improve Outcome: Progressing Goal:  Respiratory complications will improve Outcome: Progressing Goal: Cardiovascular complication will be avoided Outcome: Progressing   Problem: Activity: Goal: Risk for activity intolerance will decrease Outcome: Progressing   Problem: Nutrition: Goal: Adequate nutrition will be maintained Outcome: Progressing   Problem: Coping: Goal: Level of anxiety will decrease Outcome: Progressing   Problem: Elimination: Goal: Will not experience complications related to bowel motility Outcome: Progressing Goal: Will not experience complications related to urinary retention Outcome: Progressing   Problem: Pain Management: Goal: General experience of comfort will improve Outcome: Progressing   Problem: Safety: Goal: Ability to remain free from injury will improve Outcome: Progressing   Problem: Skin Integrity: Goal: Risk for impaired skin integrity will decrease Outcome: Progressing   Problem: Education: Goal: Ability to describe self-care measures that may prevent or decrease complications (Diabetes Survival Skills Education) will improve Outcome: Progressing Goal: Individualized Educational Video(s) Outcome: Progressing   Problem: Coping: Goal: Ability to adjust to condition or change in health will improve Outcome: Progressing   Problem: Fluid Volume: Goal: Ability to maintain a balanced intake and output will improve Outcome: Progressing   Problem: Health Behavior/Discharge Planning: Goal: Ability to identify and utilize available resources and services will improve Outcome: Progressing Goal: Ability to manage health-related needs will improve Outcome: Progressing   Problem: Metabolic: Goal: Ability to maintain appropriate glucose levels will improve Outcome: Progressing   Problem: Nutritional: Goal: Maintenance of adequate nutrition will improve Outcome: Progressing Goal: Progress toward achieving an optimal weight will improve Outcome: Progressing   Problem:  Skin Integrity: Goal: Risk for impaired skin integrity will decrease Outcome: Progressing   Problem: Tissue Perfusion: Goal: Adequacy of tissue perfusion will improve Outcome: Progressing   Pt arrived to ICU/SD from ED via ED stretcher. Pt A&O x3 and on room air. NIH 0. Pt updated on POC. CHG bath completed an pt changed into clothing, which were placed in a belonging bag. Pt personal walker in the room.

## 2023-02-16 NOTE — Plan of Care (Signed)
  Problem: Education: Goal: Knowledge of disease or condition will improve Outcome: Not Progressing Goal: Knowledge of secondary prevention will improve (MUST DOCUMENT ALL) Outcome: Not Progressing Goal: Knowledge of patient specific risk factors will improve Loraine Leriche N/A or DELETE if not current risk factor) Outcome: Not Progressing   Problem: Intracerebral Hemorrhage Tissue Perfusion: Goal: Complications of Intracerebral Hemorrhage will be minimized Outcome: Progressing   Problem: Coping: Goal: Will verbalize positive feelings about self Outcome: Progressing Goal: Will identify appropriate support needs Outcome: Not Progressing   Problem: Health Behavior/Discharge Planning: Goal: Ability to manage health-related needs will improve Outcome: Not Progressing Goal: Goals will be collaboratively established with patient/family Outcome: Not Progressing   Problem: Self-Care: Goal: Ability to participate in self-care as condition permits will improve Outcome: Not Progressing Goal: Verbalization of feelings and concerns over difficulty with self-care will improve Outcome: Not Progressing Goal: Ability to communicate needs accurately will improve Outcome: Not Progressing   Problem: Nutrition: Goal: Risk of aspiration will decrease Outcome: Not Progressing Goal: Dietary intake will improve Outcome: Not Progressing   Problem: Education: Goal: Knowledge of General Education information will improve Description: Including pain rating scale, medication(s)/side effects and non-pharmacologic comfort measures Outcome: Not Progressing   Problem: Health Behavior/Discharge Planning: Goal: Ability to manage health-related needs will improve Outcome: Not Progressing   Problem: Clinical Measurements: Goal: Ability to maintain clinical measurements within normal limits will improve Outcome: Not Progressing Goal: Will remain free from infection Outcome: Not Progressing Goal: Diagnostic test  results will improve Outcome: Not Progressing Goal: Respiratory complications will improve Outcome: Not Progressing Goal: Cardiovascular complication will be avoided Outcome: Not Progressing   Problem: Activity: Goal: Risk for activity intolerance will decrease Outcome: Not Progressing   Problem: Nutrition: Goal: Adequate nutrition will be maintained Outcome: Not Progressing   Problem: Coping: Goal: Level of anxiety will decrease Outcome: Not Progressing   Problem: Elimination: Goal: Will not experience complications related to bowel motility Outcome: Not Progressing Goal: Will not experience complications related to urinary retention Outcome: Not Progressing   Problem: Pain Management: Goal: General experience of comfort will improve Outcome: Not Progressing   Problem: Safety: Goal: Ability to remain free from injury will improve Outcome: Not Progressing   Problem: Skin Integrity: Goal: Risk for impaired skin integrity will decrease Outcome: Not Progressing   Problem: Education: Goal: Ability to describe self-care measures that may prevent or decrease complications (Diabetes Survival Skills Education) will improve Outcome: Not Progressing Goal: Individualized Educational Video(s) Outcome: Not Progressing   Problem: Coping: Goal: Ability to adjust to condition or change in health will improve Outcome: Not Progressing   Problem: Fluid Volume: Goal: Ability to maintain a balanced intake and output will improve Outcome: Not Progressing   Problem: Health Behavior/Discharge Planning: Goal: Ability to identify and utilize available resources and services will improve Outcome: Not Progressing Goal: Ability to manage health-related needs will improve Outcome: Not Progressing   Problem: Metabolic: Goal: Ability to maintain appropriate glucose levels will improve Outcome: Not Progressing   Problem: Nutritional: Goal: Maintenance of adequate nutrition will  improve Outcome: Not Progressing Goal: Progress toward achieving an optimal weight will improve Outcome: Not Progressing   Problem: Skin Integrity: Goal: Risk for impaired skin integrity will decrease Outcome: Not Progressing   Problem: Tissue Perfusion: Goal: Adequacy of tissue perfusion will improve Outcome: Not Progressing

## 2023-02-16 NOTE — Progress Notes (Signed)
SLP Cancellation Note  Patient Details Name: Nicholas Weiss MRN: [id number] DOB: [birth year]   Cancelled treatment:       Reason Eval/Treat Not Completed:  (chart reviewed.)  Will hold on evaluation at this time d/t ~16 hours acutely admitted. ST services will f/u w/ assessment tomorrow. NSG consulted, agreed.     Jerilynn Som, MS, CCC-SLP Speech Language Pathologist Rehab Services; [facility] [phone] (ascom) Nyhla Mountjoy 02/16/2023, 3:41 PM

## 2023-02-16 NOTE — Inpatient Diabetes Management (Signed)
Inpatient Diabetes Program Recommendations  AACE/ADA: New Consensus Statement on Inpatient Glycemic Control (2015)  Target Ranges:  Prepandial:   less than 140 mg/dL      Peak postprandial:   less than 180 mg/dL (1-2 hours)      Critically ill patients:  140 - 180 mg/dL   Lab Results  Component Value Date   GLUCAP 236 (H) 02/16/2023   HGBA1C 9.0 (H) 11/14/2022    Latest Reference Range & Units 02/12/23 12:18 02/15/23 17:13 02/16/23 00:56 02/16/23 05:35 02/16/23 08:41  Glucose-Capillary 70 - 99 mg/dL 161 (H) 096 (H) Novolog 5 units @ 2211 202 (H) 223 (H) 236 (H) Novolog 7 units  (H): Data is abnormally high  Review of Glycemic Control  Diabetes history: DM1 Outpatient Diabetes medications: Tresiba 5 units every day, Humalog 0-9 units TID  Current orders for Inpatient glycemic control: Semglee 5 units daily, Novolog 0-20 units tid, 0-5 units hs  Inpatient Diabetes Program Recommendations:   Please consider: -Decrease Novolog correction to 0-9 units tid, 0-5 units hs  DM coordinator spoke with patient on prior admission 12-29-22 regarding hypoglycemia and patient had taken Humalog 13 units and had hypoglycemia. Requested patient on this admission to limit dose of Humalog correction to 0-9 units as prescribed.  Thank you, Billy Fischer. Leotha Westermeyer, RN, MSN, CDCES  Diabetes Coordinator Inpatient Glycemic Control Team Team Pager [phone] (8am-5pm) 02/16/2023 9:04 AM

## 2023-02-17 ENCOUNTER — Encounter: Payer: Self-pay | Admitting: Oncology

## 2023-02-17 DIAGNOSIS — I619 Nontraumatic intracerebral hemorrhage, unspecified: Secondary | ICD-10-CM | POA: Diagnosis not present

## 2023-02-17 LAB — VITAMIN D 25 HYDROXY (VIT D DEFICIENCY, FRACTURES): Vit D, 25-Hydroxy: 24.05 ng/mL — ABNORMAL LOW (ref 30–100)

## 2023-02-17 LAB — GLUCOSE, CAPILLARY
Glucose-Capillary: 125 mg/dL — ABNORMAL HIGH (ref 70–99)
Glucose-Capillary: 128 mg/dL — ABNORMAL HIGH (ref 70–99)
Glucose-Capillary: 245 mg/dL — ABNORMAL HIGH (ref 70–99)
Glucose-Capillary: 383 mg/dL — ABNORMAL HIGH (ref 70–99)
Glucose-Capillary: 432 mg/dL — ABNORMAL HIGH (ref 70–99)

## 2023-02-17 LAB — IRON AND TIBC
Iron: 53 ug/dL (ref 45–182)
Saturation Ratios: 29 % (ref 17.9–39.5)
TIBC: 185 ug/dL — ABNORMAL LOW (ref 250–450)
UIBC: 132 ug/dL

## 2023-02-17 MED ORDER — SODIUM CHLORIDE 0.9 % IV BOLUS
500.0000 mL | Freq: Once | INTRAVENOUS | Status: AC
  Administered 2023-02-17: 500 mL via INTRAVENOUS

## 2023-02-17 MED ORDER — INSULIN ASPART 100 UNIT/ML IJ SOLN
20.0000 [IU] | Freq: Once | INTRAMUSCULAR | Status: AC
  Administered 2023-02-17: 20 [IU] via SUBCUTANEOUS
  Filled 2023-02-17: qty 1

## 2023-02-17 MED ORDER — QUETIAPINE FUMARATE 25 MG PO TABS
25.0000 mg | ORAL_TABLET | Freq: Two times a day (BID) | ORAL | Status: DC
  Administered 2023-02-17 – 2023-02-19 (×5): 25 mg via ORAL
  Filled 2023-02-17 (×5): qty 1

## 2023-02-17 NOTE — Inpatient Diabetes Management (Signed)
Inpatient Diabetes Program Recommendations  AACE/ADA: New Consensus Statement on Inpatient Glycemic Control (2015)  Target Ranges:  Prepandial:   less than 140 mg/dL      Peak postprandial:   less than 180 mg/dL (1-2 hours)      Critically ill patients:  140 - 180 mg/dL   Lab Results  Component Value Date   GLUCAP 432 (H) 02/17/2023   HGBA1C 9.0 (H) 11/14/2022    Latest Reference Range & Units 02/16/23 08:41 02/16/23 11:50 02/16/23 17:18 02/16/23 21:25 02/17/23 00:46 02/17/23 10:35  Glucose-Capillary 70 - 99 mg/dL 147 (H) 829 (H) 562 (H) 156 (H) 245 (H) 432 (H)  (H): Data is abnormally high  Review of Glycemic Control  Diabetes history: DM1 Outpatient Diabetes medications: Tresiba 5 units every day, Humalog 0-9 units TID  Current orders for Inpatient glycemic control: Semglee 5 units daily, Novolog 0-9 units tid, 0-5 units hs  Inpatient Diabetes Program Recommendations:   Patient did not receive Semglee 5 units daily yesterday. CBG fasting 432 @ 1035. Ordered Novolog 20 units x 1.  May consider decrease Novolog x 1 of 10 units instead of 20 units due to potential of hypoglycemia.  Thank you, Billy Fischer. Thai Hemrick, RN, MSN, CDCES  Diabetes Coordinator Inpatient Glycemic Control Team Team Pager [phone] (8am-5pm) 02/17/2023 11:36 AM

## 2023-02-17 NOTE — Evaluation (Signed)
Occupational Therapy Evaluation Patient Details Name: Nicholas Weiss MRN: [id number] DOB: 26-Jul-1950 Today's Date: 02/17/2023   History of Present Illness Nicholas Weiss is a 72 y.o. African-American male with medical history significant for hypertension, dyslipidemia and type 2 diabetes mellitus, who presented to the emergency room with acute onset of recurrent syncope.  It has been occasionally associated with hypoglycemia.  Few days ago he fell and hit his head yesterday he had a syncopal episode without injuries.  He denied any headache or dizziness or blurred vision.  No paresthesias or focal muscle weakness.  He reported having a R rib fracture from recent fall.   Clinical Impression   Pt was seen for OT/PT evaluation this date. Prior to hospital admission, pt reports he was indep/mod I in ADL/IADL. Pt lives with significant other in one level home with 1 step to enter.  On arrival to room Pt asleep in bed, frequent verbal cues to keep his eyes open. Pt presents to acute OT demonstrating impaired ADL performance and functional mobility 2/2 (See OT problem list for additional functional deficits). Bed mobility; pt required CGA for supine > sit, but required MODA +2 to return to supine due to impaired awareness of deficits. Pt attempted to sit on EOB; ?syncopal episode occurred, required physical assistance to prevent falling. Vitals; supine after sitting on edge of bed: BP 72/51(MAP 59), nurse notified, and in room. Abdominal binder and TED adjusted prior to end of session with oxygen > 90% on RA, nursing to remove abdominal binder if any c/o SOB. Pt would benefit from skilled OT services to address noted impairments and functional limitations (see below for any additional details) in order to maximize safety and independence while minimizing falls risk and caregiver burden. OT will follow acutely.        If plan is discharge home, recommend the following: A lot of help with walking and/or  transfers;A lot of help with bathing/dressing/bathroom;Assistance with cooking/housework;Direct supervision/assist for financial management;Assist for transportation;Direct supervision/assist for medications management;Help with stairs or ramp for entrance    Functional Status Assessment  Patient has had a recent decline in their functional status and demonstrates the ability to make significant improvements in function in a reasonable and predictable amount of time.  Equipment Recommendations  Other (comment) (Next venue of care)    Recommendations for Other Services       Precautions / Restrictions Precautions Precautions: Fall Required Braces or Orthoses: Other Brace Other Brace: Abdominal binder Restrictions Weight Bearing Restrictions: No      Mobility Bed Mobility Overal bed mobility: Needs Assistance Bed Mobility: Supine to Sit, Sit to Supine     Supine to sit: Contact guard Sit to supine: Mod assist, +2 for physical assistance   General bed mobility comments: Poor awareness of deficits during sit > supine, requiring MODA +2 to return to bed after ?syncopal episode sitting up.    Transfers                   General transfer comment: NT due to very low BP when in supine and sitting      Balance Overall balance assessment: Needs assistance, History of Falls Sitting-balance support: Feet supported Sitting balance-Leahy Scale: Poor Sitting balance - Comments: due to syncope, poor sitting balance Postural control: Posterior lean, Left lateral lean  ADL either performed or assessed with clinical judgement   ADL Overall ADL's : Needs assistance/impaired Eating/Feeding: Supervision/ safety Eating/Feeding Details (indicate cue type and reason): Drinking from straw                 Lower Body Dressing: Maximal assistance;Bed level Lower Body Dressing Details (indicate cue type and reason): Donning socks                General ADL Comments: Further ADL limited on this date due to low BP     Vision         Perception         Praxis         Pertinent Vitals/Pain Pain Assessment Pain Assessment: Faces Faces Pain Scale: Hurts a little bit Pain Location: ribs Pain Descriptors / Indicators: Discomfort, Guarding, Sore Pain Intervention(s): Monitored during session, Limited activity within patient's tolerance     Extremity/Trunk Assessment Upper Extremity Assessment Upper Extremity Assessment: Generalized weakness   Lower Extremity Assessment Lower Extremity Assessment: Generalized weakness;Defer to PT evaluation   Cervical / Trunk Assessment Cervical / Trunk Assessment: Normal   Communication Communication Communication: No apparent difficulties Cueing Techniques: Verbal cues;Gestural cues;Tactile cues   Cognition Arousal: Alert Behavior During Therapy: WFL for tasks assessed/performed Overall Cognitive Status: No family/caregiver present to determine baseline cognitive functioning Area of Impairment: Orientation, Following commands, Safety/judgement, Memory, Problem solving                 Orientation Level: Time, Disoriented to   Memory: Decreased short-term memory Following Commands: Follows one step commands with increased time Safety/Judgement: Decreased awareness of safety, Decreased awareness of deficits   Problem Solving: Slow processing General Comments: Will continue to assess, difficult to assess due to low BP     General Comments       Exercises Other Exercises Other Exercises: Edu: role of OT, fall prevention techniques, benifits of TED and abdominal binder   Shoulder Instructions      Home Living Family/patient expects to be discharged to:: Private residence Living Arrangements: Spouse/significant other;Children Available Help at Discharge: Family;Available 24 hours/day Type of Home: House Home Access: Stairs to enter [employer] of Steps: 1 STE bil railing Entrance Stairs-Rails: Can reach both Home Layout: One level     Bathroom Shower/Tub: Producer, television/film/video: Standard     Home Equipment: Agricultural consultant (2 wheels);Cane - quad;Shower seat;Grab bars - toilet;Grab bars - tub/shower   Additional Comments: Pt reports he uses SPC the majority of the time and the RW only when he's feeling unsteady.  Lives With: Significant other    Prior Functioning/Environment Prior Level of Function : Independent/Modified Independent;Driving             Mobility Comments: Mod indep for ADL's ADLs Comments: Reports independent with ADLs and IADLs, with exception of medication management        OT Problem List: Decreased coordination;Decreased activity tolerance;Decreased safety awareness;Impaired balance (sitting and/or standing);Decreased knowledge of precautions;Decreased knowledge of use of DME or AE      OT Treatment/Interventions:      OT Goals(Current goals can be found in the care plan section) Acute Rehab OT Goals Patient Stated Goal: to return to PLOF OT Goal Formulation: With patient Time For Goal Achievement: 03/03/23 Potential to Achieve Goals: Good ADL Goals Pt Will Perform Grooming: with modified independence;standing Pt Will Perform Lower Body Dressing: with modified independence;sit to/from stand Pt Will Transfer to Toilet: with modified independence;ambulating;regular  height toilet Pt Will Perform Toileting - Clothing Manipulation and hygiene: with modified independence;sitting/lateral leans  OT Frequency:      Co-evaluation PT/OT/SLP Co-Evaluation/Treatment: Yes Reason for Co-Treatment: To address functional/ADL transfers;For patient/therapist safety;Necessary to address cognition/behavior during functional activity PT goals addressed during session: Mobility/safety with mobility;Balance OT goals addressed during session: ADL's and self-care;Proper use of Adaptive  equipment and DME      AM-PAC OT "6 Clicks" Daily Activity     Outcome Measure Help from another person eating meals?: None Help from another person taking care of personal grooming?: A Little Help from another person toileting, which includes using toliet, bedpan, or urinal?: A Little Help from another person bathing (including washing, rinsing, drying)?: A Lot Help from another person to put on and taking off regular upper body clothing?: A Little Help from another person to put on and taking off regular lower body clothing?: A Lot 6 Click Score: 17   End of Session Nurse Communication: Mobility status;Other (comment) (Vitals)  Activity Tolerance: Treatment limited secondary to medical complications (Comment);Other (comment) (Low BP) Patient left: in bed;with call bell/phone within reach;with bed alarm set;with nursing/sitter in room  OT Visit Diagnosis: Other abnormalities of gait and mobility (R26.89);Repeated falls (R29.6)                Time: 9629-5284 OT Time Calculation (min): 15 min Charges:  OT General Charges $OT Visit: 1 Visit OT Evaluation $OT Eval Moderate Complexity: 1 Mod  [employer], OTS

## 2023-02-17 NOTE — Plan of Care (Signed)
  Problem: Education: Goal: Knowledge of disease or condition will improve Outcome: Progressing   

## 2023-02-17 NOTE — Evaluation (Signed)
Speech Language Pathology Evaluation Patient Details Name: Nicholas Weiss MRN: [id number] DOB: Jul 23, 1950 Today's Date: 02/17/2023 Time: 1225-1300 SLP Time Calculation (min) (ACUTE ONLY): 35 min  Problem List:  Patient Active Problem List   Diagnosis Date Noted   ICH (intracerebral hemorrhage) (HCC) 02/16/2023   Uncontrolled diabetes mellitus with hyperglycemia (HCC) 02/16/2023   GERD without esophagitis 02/16/2023   Essential hypertension 02/16/2023   Diabetic neuropathy (HCC) 02/16/2023   Brain bleed (HCC) 02/16/2023   Myoclonus 01/15/2023   Hypothermia 12/29/2022   Dizziness 11/13/2022   Substance abuse (HCC) 09/18/2022   History of GI bleed 09/18/2022   H/O orthostatic hypotension 09/18/2022   Uncontrolled type 1 diabetes mellitus with hyperglycemia, with long-term current use of insulin (HCC) 08/26/2022   Postural dizziness with presyncope 08/18/2022   Near syncope 08/18/2022   Closed fracture of shaft of left humerus 06/18/2022   Malnutrition of moderate degree 06/10/2022   Hypotension 06/10/2022   GI bleed 06/06/2022   Subdural hematoma (HCC) 05/15/2022   Falls 05/15/2022   Shoulder pain, left 05/15/2022   History of amputation of left great toe (HCC) 05/04/2022   Hypophosphatemia 04/28/2022   Protein-calorie malnutrition, severe 04/24/2022   Diabetic ulcer of toe associated with diabetes mellitus due to underlying condition, with fat layer exposed (HCC) 04/24/2022   Streptococcal bacteremia 04/24/2022   Leukopenia 04/24/2022   History of multiple myeloma 04/24/2022   Sympathotonic orthostatic hypotension 04/24/2022   Weight loss 04/23/2022   Osteomyelitis of left foot (HCC) 04/22/2022   DKA, type 1 (HCC) 04/22/2022   Dehydration 04/08/2022   Diabetic ketoacidosis without coma associated with type 1 diabetes mellitus (HCC) 04/07/2022   Hyperkalemia 04/07/2022   DKA (diabetic ketoacidosis) (HCC) 04/07/2022   Traumatic pneumothorax 02/18/2022   Multiple  fractures of ribs, left side, initial encounter for closed fracture 02/18/2022   Pneumothorax, closed, traumatic 02/18/2022   Benign prostatic hyperplasia with nocturia 02/12/2022   Stroke (cerebrum) (HCC) 11/07/2021   Multiple myeloma (HCC) 09/29/2021   Multiple myeloma not having achieved remission (HCC) 09/29/2021   Hyponatremia 08/18/2021   COVID-19 virus infection 08/13/2021   History of CVA (cerebrovascular accident) 08/13/2021   Syncope and collapse, recurrent 08/09/2021   Hepatic lesion 08/05/2021   Dyslipidemia 05/08/2021   History of substance abuse (HCC) 12/09/2020   Acute metabolic encephalopathy 12/02/2020   Cocaine abuse (HCC) 11/30/2020   Symptomatic anemia 11/08/2020   Muscle twitching 09/12/2020   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   Pre-syncope 08/01/2020   Heart block AV first degree 08/01/2020   Anemia of chronic kidney failure, stage 3 (moderate) (HCC) 05/28/2020   PUD (peptic ulcer disease)    Esophageal dysphagia    Encounter for screening colonoscopy    Hyperglycemia 07/28/2019   Generalized weakness 07/28/2019   Hypoglycemia 04/27/2019   Unresponsiveness 04/27/2019   Lung nodule 04/07/2019   Type 1 diabetes mellitus (HCC)    Acute renal failure superimposed on stage 3a chronic kidney disease (HCC)    Hypertensive urgency 04/05/2019   AKI (acute kidney injury) (HCC) 04/05/2019   CKD (chronic kidney disease) stage 3, GFR 30-59 ml/min (HCC) 04/05/2019   Hypercholesterolemia 01/24/2016   Essential (primary) hypertension 06/07/2006   Type 1 diabetes mellitus with diabetic neuropathy, unspecified (HCC) 05/31/2006   Past Medical History:  Past Medical History:  Diagnosis Date   Acute metabolic encephalopathy 12/02/2020   DKA (diabetic ketoacidoses) 04/06/2016   Hypercholesteremia    Hypertension    Past Surgical History:  Past Surgical History:  Procedure Laterality Date  AMPUTATION TOE Left 04/25/2022   Procedure: LEFT GREAT TOE AMPUTATION AND  LEFT PARTIAL 2ND TOE AMPUTATION;  Surgeon: Felecia Shelling, DPM;  Location: [facility] ORS;  Service: Podiatry;  Laterality: Left;   COLONOSCOPY WITH PROPOFOL N/A 02/01/2020   Procedure: COLONOSCOPY WITH PROPOFOL;  Surgeon: Toney Reil, MD;  Location: [facility] ENDOSCOPY;  Service: Gastroenterology;  Laterality: N/A;   ESOPHAGOGASTRODUODENOSCOPY  02/01/2020   Procedure: ESOPHAGOGASTRODUODENOSCOPY (EGD);  Surgeon: Toney Reil, MD;  Location: [facility] ENDOSCOPY;  Service: Gastroenterology;;   ESOPHAGOGASTRODUODENOSCOPY (EGD) WITH PROPOFOL N/A 06/07/2022   Procedure: ESOPHAGOGASTRODUODENOSCOPY (EGD) WITH PROPOFOL;  Surgeon: Kerin Salen, MD;  Location: [facility] ENDOSCOPY;  Service: Gastroenterology;  Laterality: N/A;   KNEE SURGERY Right    Torn meniscus   KNEE SURGERY Left    HPI:  Per chart, pt is a 72 y.o. African-American male with medical history significant for CVA(per imaging in chart), hypertension, dyslipidemia and type 2 diabetes mellitus, who presented to the emergency room with acute onset of recurrent syncope.  It has been occasionally associated with hypoglycemia.  Few days ago he fell and hit his head yesterday he had a syncopal episode without injuries.  He denied any headache or dizziness or blurred vision.  No paresthesias or focal muscle weakness.  He reported having a rib fracture from recent fall.  He has been having right-sided chest/rib pain without palpitations.  No cough or wheezing or dyspnea.  No dysuria, oliguria or hematuria or flank pain.  No bleeding diathesis.  ED Course: When he came to the ER, BP was 171/99 with otherwise normal vital signs.  Labs revealed hyponatremia of 129 and hyperglycemia of 413 with a BUN of 39 creatinine 2.17 comparable to previous levels and high sensitive troponin I was 18.  CBC showed mild leukopenia of 3.2 and anemia close to baseline.   Pt had Imaging PRIOR TO this admit on 11/15(s/p a Fall at home) which revealed rib fxs but No evidence of acute  abnormality intracranially.   Imaging performed on 11/18 at admit revealed: "Small amount of hyperdense hemorrhage in the right frontal horn,  which is new from the 02/12/2023 exam, of indeterminate etiology. No  hydrocephalus.  2. No acute fracture or traumatic listhesis in the cervical spine.".    OF NOTE: on 02/05/2023: pt arrived at the [facility] for an appt. w/ Dr. Barbaraann Cao.  At arrival, pt was SOB and sitting on the bench outside the building.  He refused care by Staff; "Pt again refused vitals to be taken. Pt Stated that if we "took the blood pressure or pulse oxygen that would send me to the hospital." He refused RN to further assess. We explained to patient that we are concerned about this well being. Pt stated "there is nothing wrong with me. I don't need my blood pressure checked. I don't need my oxygen checked. I simply sat down to drink my water.".   OF NOTE: Prior imaging in 2023 revealed: " 1.5 cm acute ischemic infarct involving the posterior left  periventricular white matter at the left frontoparietal region. No  associated hemorrhage or significant regional mass effect.  2. Underlying mild chronic microvascular ischemic disease.".    Assessment / Plan / Recommendation Clinical Impression   Pt was seen for informal cognitive-linguistic assessment at bedside today. Pt's Significant Other was present, talking on phone but able to give a little information. She endorsed some confusion in his behaviors/responses recently.  Pt resting in bed, verbal but did not consistently open eyes and  then put covers over his head. He stated he "needed to rest right now". She endorsed some confusion in his behaviors/responses since the fall.  OF NOTE: noted pt's engagements w/ Staff at the [facility] and the ED(recent chart notes 11/8 and 11/2 respectively). Unsure of his full baseline Cognitive status prior to this Fall, admit. Current Head Imaging indicates "Small amount of hyperdense hemorrhage in the  right frontal horn,  which is new from the 02/12/2023 exam, of indeterminate etiology.", per chart.   This informal screening assessment was brief d/t pt's limited engagement w/ SLP. He indicated he was having "trouble talking d/t my kidneys hurting"; pt also endorsed Pain from his rib fxs s/p Fall. Discussed the issue of Pain in general impacting our focus and engagement; pt agreed.  Pt was able to give name and place and significant other in room; not oriented to situation of why he was admitted to the hospital. Cognitive deficits included reduced sustained attention and awareness during conversation/task, min frustration in general. Unsure of his problem-solving and reasoning skills but noted such issues in recent engagements w/ Staff per chart notes PRIOR TO this admit(11/8, 11/2). He conversed in sentences describing how he felt and what had been going on at home causing him to not feel well and/or fall. He was able to id the meals/trays he had in his room and what meal would follow this evening("dinner"). Unable to fully assess receptive language skills but expressive language skills were grossly functional though limited engagement. Speech was 100% intelligible - no overt motor speech deficits were apparent. Impairment in pragmatics+ -- pt's topic maintenance was inconsistent; he pulled the Covers over his Head and eyes were closed when talking briefly. Per NSG report, pt has been agitated at times.   In setting of pt's limited engagement in this assessment, recommend ST services continue for ongoing assessment of Cognitive-communication abilities and to determine if any needs for Discharge. Recommended to significant other in room to reduce distractions during verbal communication and encourage pt to take his time, slow down if needed.  Recommend ongoing skilled ST services for further assessment to determine needs/support for safe Discharge planning. NSG/MD updated.   SLP Assessment  SLP  Recommendation/Assessment: Patient needs continued Speech Lanaguage Pathology Services (ongoing assessment) SLP Visit Diagnosis: Cognitive communication deficit (R41.841)    Recommendations for follow up therapy are one component of a multi-disciplinary discharge planning process, led by the attending physician.  Recommendations may be updated based on patient status, additional functional criteria and insurance authorization.    Follow Up Recommendations  Follow physician's recommendations for discharge plan and follow up therapies    Assistance Recommended at Discharge  Frequent or constant Supervision/Assistance  Functional Status Assessment Patient has had a recent decline in their functional status and demonstrates the ability to make significant improvements in function in a reasonable and predictable amount of time. (TBD)  Frequency and Duration min 2x/week  2 weeks      SLP Evaluation Cognition  Overall Cognitive Status: Difficult to assess (unsure if prior impairment -- noted h/o poor decision-making per chart notes (02/05/2023; 01/30/2023)) Arousal/Alertness: Awake/alert (eyes closed, then covers over his head) Orientation Level: Oriented to person;Oriented to place Attention: Focused;Sustained Focused Attention: Impaired Sustained Attention: Impaired Awareness: Impaired (lack of engagement in session) Executive Function: Decision Making Decision Making: Impaired (per recent chart notes (11/8, 11/2)) Behaviors: Poor frustration tolerance (min) Safety/Judgment:  (unsure)       Comprehension  Auditory Comprehension Overall Auditory Comprehension:  (difficult  to assess w/ limited engagement in session today; he brieflyverbally engaged w/ this SLP) Yes/No Questions: Within Functional Limits Visual Recognition/Discrimination Discrimination: Not tested Reading Comprehension Reading Status: Not tested    Expression Expression Primary Mode of Expression: Verbal Verbal  Expression Overall Verbal Expression:  (difficult to assess w/ limited engagement in session today; he brieflyverbally engaged w/ this SLP) Initiation: No impairment Automatic Speech: Name Level of Generative/Spontaneous Verbalization: Sentence Pragmatics: Impairment Impairments: Abnormal affect Interfering Components: Attention Written Expression Dominant Hand: Right Written Expression: Not tested   Oral / Motor  Oral Motor/Sensory Function Overall Oral Motor/Sensory Function: Within functional limits (for Speech Intelligibility) Motor Speech Overall Motor Speech: Appears within functional limits for tasks assessed Respiration: Within functional limits Phonation: Normal Resonance: Within functional limits Articulation: Within functional limitis Intelligibility: Intelligible Motor Planning: Witnin functional limits Motor Speech Errors: Not applicable              Jerilynn Som, MS, CCC-SLP Speech Language Pathologist Rehab Services; [facility] [place] [phone] (ascom) Eurydice Calixto 02/17/2023, 3:25 PM

## 2023-02-17 NOTE — Progress Notes (Signed)
Patient requesting to have BM. Very boisterous and yelling at staff. Dayshift primary RN and this Charge RN attempting to explain to the patient activity restrictions and safety precautions d/t his orthostatic hypotension and explained that he passed out last night when standing for his orthostatics.   Patient continues to get loud and yell at staff. Patient tells staff that we are lying about him passing out and we could say anything and he does not trust [place].   Dayshift primary RN and this charge RN got patient up to the bedside commode. Patient safely transferred to the bedside commode. Patient continued to yell at this Charge RN while he was sitting on the [facility].   Security at bedside.   Patient safely transferred to the bed from the [facility]. Patient requesting to speak with physician. RN notified Dr. Lucianne Muss.   This RN updated the primary RN of the above information.

## 2023-02-17 NOTE — Progress Notes (Signed)
[facility] Progress Note  Patient: Nicholas Weiss    ZOX:[id number]  DOA: 02/15/2023     Date of Service: the patient was seen and examined on 02/17/2023  Chief Complaint  Patient presents with   Loss of Consciousness   Brief hospital course: Nicholas Weiss is a 72 y.o. African-American male with medical history significant for hypertension, dyslipidemia and type 2 diabetes mellitus, who presented to the emergency room with acute onset of recurrent syncope.  It has been occasionally associated with hypoglycemia.  Few days ago he fell and hit his head yesterday he had a syncopal episode without injuries.  He denied any headache or dizziness or blurred vision.  No paresthesias or focal muscle weakness.  He reported having a rib fracture from recent fall.  He has been having right-sided chest/rib pain without palpitations.  No cough or wheezing or dyspnea.  No dysuria, oliguria or hematuria or flank pain.  No bleeding diathesis.   ED Course: When he came to the ER, BP was 171/99 with otherwise normal vital signs.  Labs revealed hyponatremia of 129 and hyperglycemia of 413 with a BUN of 39 creatinine 2.17 comparable to previous levels and high sensitive troponin I was 18.  CBC showed mild leukopenia of 3.2 and anemia close to baseline.   EKG as reviewed by me : Normal sinus rhythm with rate of 81 without acute findings.  QTc was 548 MS. Imaging: On 02/12/2023: Rib x-ray showed minimally displaced fracture of the posterolateral right 10th rib with no acute cardiopulmonary disease.   Noncontrasted CT scan and C-spine CT: 1. Small amount of hyperdense hemorrhage in the right frontal horn, which is new from the 02/12/2023 exam, of indeterminate etiology. No hydrocephalus. 2. No acute fracture or traumatic listhesis in the cervical spine.   - Head  and neck CTA:  1. No evidence of active extravasation in the right frontal horn hemorrhage. 2. No intracranial large vessel occlusion or significant  stenosis.  3. 50% stenosis in the proximal bilateral ICAs, just distal to the bifurcations. 4. Severe stenosis at the origin of the right vertebral artery. 5. Multiple dural based enhancing lesions, the largest of which measures up to 9 mm, which correlate with calcified lesions on the prior head CT, most likely small meningiomas, without significant mass effect. 6. Aortic atherosclerosis.   6 hours later repeat head CT scan without contrast showed stable hemorrhage in the anterior horn of the right lateral ventricle.   The patient was given 5 mg oxycodone, 5 units of subcutaneous NovoLog and 1 L bolus of IV normal saline.  Dr. Katrinka Blazing with neurosurgery was notified about the patient.  He will be admitted to a stepdown unit bed for further evaluation and management.   Assessment and Plan:   # Intraventricular hematoma, s/p fall/syncope due to orthostatic hypotension Neurosurgery consulted, recommended conservative management Repeat CT head stable. As per neurosurgery no more scan needed, it should be self-limited and no follow-up needed with neurosurgery.  If situation change then we can repeat CT head and he may follow-up with neurosurgery Hold Eliquis for 2 weeks, if there is a strong indication for him to be on it.   # Orthostatic hypotension Started midodrine 10 mg p.o. 3 times daily with holding parameters Started Florinef 0.1 mg p.o. daily with holding parameters Monitor BP and titrate medications accordingly  # Hypertension Avoid low BP due to orthostatics Use hydralazine 25 mg p.o. TID prn if SBP > 50 mmHg Monitor BP and titrate  medications accordingly   Uncontrolled diabetes mellitus with hyperglycemia Continue Semglee 5 units daily, NovoLog sliding scale, monitor CBG Diabetic coordinator consulted Continue diabetic diet    Hyponatremia pseudohyponatremia due to hyperglycemia. monitor sodium level daily    Diabetic neuropathy  - continue Neurontin.   GERD without  esophagitis - continue PPI therapy.  Symptomatic myoclonus.  Keppra 500mg  BID for symptomatic myoclonus was started by Neurologist on 01/15/2023  Body mass index is 19.74 kg/m.  Interventions:  Diet: Carb modified diet DVT Prophylaxis: SCD, pharmacological prophylaxis contraindicated due to ICH    Advance goals of care discussion: Full code  Family Communication: family was not present at bedside, at the time of interview.  The pt provided permission to discuss medical plan with the family. Opportunity was given to ask question and all questions were answered satisfactorily.   Disposition:  Pt is from Home, admitted with intraventricular hemorrhage, orthostatic hypotension, syncope, still has orthostatic vitals, which precludes a safe discharge. Discharge to home, when stable, may need few days to improve.  Subjective: No significant events overnight, patient was resting comfortably, denied any headache or dizziness.  No shortness of breath, no chest pain or palpitations.  Patient was thirsty and he was asking for water.  Physical Exam: General: NAD, lying comfortably Appear in no distress, affect appropriate Eyes: PERRLA ENT: Oral Mucosa Clear, moist  Neck: no JVD,  Cardiovascular: S1 and S2 Present, no Murmur,  Respiratory: good respiratory effort, Bilateral Air entry equal and Decreased, no Crackles, no wheezes Abdomen: Bowel Sound present, Soft and no tenderness,  Skin: no rashes Extremities: no Pedal edema, no calf tenderness, s/p left first and second toe amputation Neurologic: without any new focal findings Gait not checked due to patient safety concerns  Vitals:   02/16/23 2100 02/17/23 0044 02/17/23 0443 02/17/23 1036  BP: 125/75 124/77 113/79 97/63  Pulse: 75 86 95 96  Resp: 16 18 17 15   Temp:  98.9 F (37.2 C) 98.6 F (37 C) 98.4 F (36.9 C)  TempSrc:  Oral Oral   SpO2: 100% 100% 98% 97%  Weight:  62.4 kg    Height:  5\' 10"  (1.778 m)      Intake/Output  Summary (Last 24 hours) at 02/17/2023 1154 Last data filed at 02/16/2023 2000 Gross per 24 hour  Intake 120 ml  Output 475 ml  Net -355 ml   Filed Weights   02/15/23 1713 02/16/23 0545 02/17/23 0044  Weight: 64 kg 65 kg 62.4 kg    Data Reviewed: I have personally reviewed and interpreted daily labs, tele strips, imagings as discussed above. I reviewed all nursing notes, pharmacy notes, vitals, pertinent old records I have discussed plan of care as described above with RN and patient/family.  CBC: Recent Labs  Lab 02/12/23 1210 02/15/23 1826 02/16/23 0827  WBC 3.9* 3.2* 3.1*  NEUTROABS 2.3  --  1.6*  HGB 9.5* 9.1* 9.6*  HCT 29.5* 27.9* 29.7*  MCV 94.9 94.3 93.7  PLT 214 226 225   Basic Metabolic Panel: Recent Labs  Lab 02/12/23 1210 02/15/23 1826 02/16/23 0827  NA 132* 129* 135  K 4.8 5.1 4.7  CL 103 99 103  CO2 23 22 24   GLUCOSE 155* 413* 226*  BUN 42* 39* 35*  CREATININE 2.20* 2.17* 1.72*  CALCIUM 8.8* 8.6* 8.6*  MG  --  2.2  --     Studies: No results found.  Scheduled Meds:  atorvastatin  80 mg Oral QHS   feeding supplement  237 mL Oral BID BM   fludrocortisone  0.1 mg Oral Daily   gabapentin  300 mg Oral BID   hydrALAZINE  25 mg Oral TID   insulin aspart  0-5 Units Subcutaneous QHS   insulin aspart  0-9 Units Subcutaneous TID WC   insulin glargine-yfgn  5 Units Subcutaneous Daily   midodrine  10 mg Oral TID WC   multivitamin with minerals  1 tablet Oral Daily   pantoprazole  40 mg Oral QHS   senna-docusate  1 tablet Oral BID   Continuous Infusions: PRN Meds: acetaminophen **OR** acetaminophen (TYLENOL) oral liquid 160 mg/5 mL **OR** acetaminophen, LORazepam, ondansetron (ZOFRAN) IV, traMADol, traZODone, ziprasidone  Time spent: 35 minutes  Author: Gillis Santa. MD [facility] 02/17/2023 11:54 AM  To reach On-call, see care teams to locate the attending and reach out to them via [email or web address]. If 7PM-7AM, please contact  night-coverage If you still have difficulty reaching the attending provider, please page the [facility] (Director on Call) for [facility] on amion for assistance.

## 2023-02-17 NOTE — Evaluation (Signed)
Physical Therapy Evaluation Patient Details Name: Nicholas Weiss MRN: [id number] DOB: [birth year] Today's Date: 02/17/2023  History of Present Illness  Nicholas Weiss is a 72 y.o. African-American male with medical history significant for hypertension, dyslipidemia and type 2 diabetes mellitus, who presented to the emergency room with acute onset of recurrent syncope.  It has been occasionally associated with hypoglycemia.  Few days ago he fell and hit his head yesterday he had a syncopal episode without injuries.  He denied any headache or dizziness or blurred vision.  No paresthesias or focal muscle weakness.  He reported having a R rib fracture from recent fall.   Clinical Impression  Patient received in bed sleeping. Requires significant time to rouse to attempt to participate. Patient has poor awareness of syncopal episodes placing him at very high fall risk. He was able to perform supine to sit with cga, immediately became syncopal and falling over in bed. Required mod +2 assist to safely return him to supine. BP checked and was very low. 70s/40s. Patient demonstrates poor awareness. Wanted to sit up again. He requires education regarding his hypotension. Patient will continue to benefit from skilled PT to improve functional independence and safety.           If plan is discharge home, recommend the following: Two people to help with walking and/or transfers;A lot of help with bathing/dressing/bathroom;Help with stairs or ramp for entrance;Assistance with feeding;Assist for transportation;Assistance with cooking/housework   Can travel by private vehicle   No    Equipment Recommendations None recommended by PT;Other (comment) (TBD)  Recommendations for Other Services       Functional Status Assessment Patient has had a recent decline in their functional status and demonstrates the ability to make significant improvements in function in a reasonable and predictable amount of time.      Precautions / Restrictions Precautions Precautions: Fall Required Braces or Orthoses: Other Brace Other Brace: Abdominal binder Restrictions Weight Bearing Restrictions: No      Mobility  Bed Mobility Overal bed mobility: Needs Assistance Bed Mobility: Supine to Sit, Sit to Supine     Supine to sit: Contact guard Sit to supine: Mod assist, +2 for physical assistance   General bed mobility comments: patient is able to perform bed mobility with cga but has poor awareness of deficits. Requiring mod A +2 to return patient to bed after syncopal episode sitting up.    Transfers                   General transfer comment: CI- due to very low BP standing was not attempted    Ambulation/Gait               General Gait Details: unsafe at this time  Stairs            Wheelchair Mobility     Tilt Bed    Modified Rankin (Stroke Patients Only)       Balance Overall balance assessment: Needs assistance, History of Falls Sitting-balance support: Feet supported Sitting balance-Leahy Scale: Poor Sitting balance - Comments: due to syncope, poor sitting balance Postural control: Posterior lean, Left lateral lean                                   Pertinent Vitals/Pain Pain Assessment Pain Assessment: Faces Faces Pain Scale: Hurts little more Pain Location: R Ribs when donning abd binder Pain Descriptors /  Indicators: Discomfort, Grimacing Pain Intervention(s): Monitored during session, Repositioned    Home Living Family/patient expects to be discharged to:: Private residence Living Arrangements: Spouse/significant other;Children Available Help at Discharge: Family;Available 24 hours/day Type of Home: House Home Access: Stairs to enter Entrance Stairs-Rails: Can reach both Entrance Stairs-Number of Steps: 1 STE bil railing   Home Layout: One level Home Equipment: Agricultural consultant (2 wheels);Cane - quad;Shower seat;Grab bars - toilet;Grab  bars - tub/shower Additional Comments: Pt reports he uses the cane the majority of the time and the RW only when he's feeling unsteady.    Prior Function Prior Level of Function : Independent/Modified Independent;Driving             Mobility Comments: Mod indep for ADL's ADLs Comments: Reports independent with ADLs and IADLs     Extremity/Trunk Assessment   Upper Extremity Assessment Upper Extremity Assessment: Defer to OT evaluation    Lower Extremity Assessment Lower Extremity Assessment: Overall WFL for tasks assessed    Cervical / Trunk Assessment Cervical / Trunk Assessment: Normal  Communication   Communication Communication: No apparent difficulties Cueing Techniques: Verbal cues;Gestural cues;Tactile cues  Cognition Arousal: Lethargic Behavior During Therapy: Impulsive Overall Cognitive Status: Within Functional Limits for tasks assessed                                          General Comments      Exercises     Assessment/Plan    PT Assessment Patient needs continued PT services  PT Problem List Decreased activity tolerance;Decreased balance;Decreased mobility;Decreased safety awareness;Pain       PT Treatment Interventions DME instruction;Gait training;Functional mobility training;Therapeutic activities;Patient/family education;Balance training;Therapeutic exercise    PT Goals (Current goals can be found in the Care Plan section)  Acute Rehab PT Goals Patient Stated Goal: none stated PT Goal Formulation: With patient Time For Goal Achievement: 03/03/23    Frequency Min 1X/week     Co-evaluation PT/OT/SLP Co-Evaluation/Treatment: Yes Reason for Co-Treatment: To address functional/ADL transfers;For patient/therapist safety;Necessary to address cognition/behavior during functional activity PT goals addressed during session: Mobility/safety with mobility;Balance         AM-PAC PT "6 Clicks" Mobility  Outcome Measure Help  needed turning from your back to your side while in a flat bed without using bedrails?: A Little Help needed moving from lying on your back to sitting on the side of a flat bed without using bedrails?: A Little Help needed moving to and from a bed to a chair (including a wheelchair)?: Total Help needed standing up from a chair using your arms (e.g., wheelchair or bedside chair)?: Total Help needed to walk in hospital room?: Total Help needed climbing 3-5 steps with a railing? : Total 6 Click Score: 10    End of Session Equipment Utilized During Treatment: Other (comment) (abdominal binder) Activity Tolerance: Patient limited by lethargy;Treatment limited secondary to medical complications (Comment) (Low BP) Patient left: in bed;with nursing/sitter in room;with bed alarm set;with call bell/phone within reach Nurse Communication: Mobility status PT Visit Diagnosis: Other abnormalities of gait and mobility (R26.89);Repeated falls (R29.6);Dizziness and giddiness (R42)    Time: 1610-9604 PT Time Calculation (min) (ACUTE ONLY): 15 min   Charges:   PT Evaluation $PT Eval Moderate Complexity: 1 Mod   PT General Charges $$ ACUTE PT VISIT: 1 Visit         Roxane Puerto, PT, GCS 02/17/23,2:53 PM

## 2023-02-18 ENCOUNTER — Telehealth: Payer: Self-pay | Admitting: *Deleted

## 2023-02-18 DIAGNOSIS — I619 Nontraumatic intracerebral hemorrhage, unspecified: Secondary | ICD-10-CM | POA: Diagnosis not present

## 2023-02-18 LAB — BASIC METABOLIC PANEL
Anion gap: 9 (ref 5–15)
BUN: 51 mg/dL — ABNORMAL HIGH (ref 8–23)
CO2: 21 mmol/L — ABNORMAL LOW (ref 22–32)
Calcium: 8.5 mg/dL — ABNORMAL LOW (ref 8.9–10.3)
Chloride: 104 mmol/L (ref 98–111)
Creatinine, Ser: 2.16 mg/dL — ABNORMAL HIGH (ref 0.61–1.24)
GFR, Estimated: 32 mL/min — ABNORMAL LOW (ref >60)
Glucose, Bld: 158 mg/dL — ABNORMAL HIGH (ref 70–99)
Potassium: 4.6 mmol/L (ref 3.5–5.1)
Sodium: 134 mmol/L — ABNORMAL LOW (ref 135–145)

## 2023-02-18 LAB — GLUCOSE, CAPILLARY
Glucose-Capillary: 185 mg/dL — ABNORMAL HIGH (ref 70–99)
Glucose-Capillary: 262 mg/dL — ABNORMAL HIGH (ref 70–99)
Glucose-Capillary: 196 mg/dL — ABNORMAL HIGH (ref 70–99)
Glucose-Capillary: 74 mg/dL (ref 70–99)

## 2023-02-18 LAB — CBC WITH DIFFERENTIAL/PLATELET
Abs Immature Granulocytes: 0.01 10*3/uL (ref 0.00–0.07)
Basophils Absolute: 0 10*3/uL (ref 0.0–0.1)
Basophils Relative: 1 %
Eosinophils Absolute: 0 10*3/uL (ref 0.0–0.5)
Eosinophils Relative: 1 %
HCT: 25.8 % — ABNORMAL LOW (ref 39.0–52.0)
Hemoglobin: 8.5 g/dL — ABNORMAL LOW (ref 13.0–17.0)
Immature Granulocytes: 0 %
Lymphocytes Relative: 40 %
Lymphs Abs: 1.3 10*3/uL (ref 0.7–4.0)
MCH: 29.9 pg (ref 26.0–34.0)
MCHC: 32.9 g/dL (ref 30.0–36.0)
MCV: 90.8 fL (ref 80.0–100.0)
Monocytes Absolute: 0.4 10*3/uL (ref 0.1–1.0)
Monocytes Relative: 11 %
Neutro Abs: 1.5 10*3/uL — ABNORMAL LOW (ref 1.7–7.7)
Neutrophils Relative %: 47 %
Platelets: 244 10*3/uL (ref 150–400)
RBC: 2.84 MIL/uL — ABNORMAL LOW (ref 4.22–5.81)
RDW: 14.6 % (ref 11.5–15.5)
WBC: 3.2 10*3/uL — ABNORMAL LOW (ref 4.0–10.5)
nRBC: 0 % (ref 0.0–0.2)

## 2023-02-18 LAB — PHOSPHORUS: Phosphorus: 3.6 mg/dL (ref 2.5–4.6)

## 2023-02-18 LAB — MAGNESIUM: Magnesium: 2.3 mg/dL (ref 1.7–2.4)

## 2023-02-18 MED ORDER — MUPIROCIN 2 % EX OINT
1.0000 | TOPICAL_OINTMENT | Freq: Two times a day (BID) | CUTANEOUS | Status: AC
  Administered 2023-02-18 – 2023-02-22 (×10): 1 via NASAL
  Filled 2023-02-18: qty 22

## 2023-02-18 MED ORDER — VITAMIN D (ERGOCALCIFEROL) 1.25 MG (50000 UNIT) PO CAPS
50000.0000 [IU] | ORAL_CAPSULE | ORAL | Status: DC
  Administered 2023-02-18: 50000 [IU] via ORAL
  Filled 2023-02-18: qty 1

## 2023-02-18 MED ORDER — LEVETIRACETAM 500 MG PO TABS
500.0000 mg | ORAL_TABLET | Freq: Two times a day (BID) | ORAL | Status: DC
  Administered 2023-02-18 – 2023-02-24 (×12): 500 mg via ORAL
  Filled 2023-02-18 (×12): qty 1

## 2023-02-18 MED ORDER — CHLORHEXIDINE GLUCONATE CLOTH 2 % EX PADS
6.0000 | MEDICATED_PAD | Freq: Every day | CUTANEOUS | Status: DC
  Administered 2023-02-18 – 2023-02-21 (×4): 6 via TOPICAL

## 2023-02-18 MED ORDER — HYDRALAZINE HCL 50 MG PO TABS: 25.0000 mg | ORAL_TABLET | Freq: Four times a day (QID) | ORAL | Status: DC | PRN

## 2023-02-18 MED ORDER — HYDRALAZINE HCL 50 MG PO TABS: 25.0000 mg | ORAL_TABLET | Freq: Three times a day (TID) | ORAL | Status: DC | PRN

## 2023-02-18 NOTE — TOC Progression Note (Signed)
Transition of Care [facility]) - Progression Note    Patient Details  Name: Nicholas Weiss MRN: [id number] Date of Birth: 04-20-1950  Transition of Care [facility]) CM/SW Contact  Allena Katz, LCSW Phone Number: 02/18/2023, 3:46 PM  Clinical Narrative:  CSW attempted to leave VM for patients friend sharon as pt is disoriented. CSW unable to leave VM CSW will follow up with her again regarding rehab recommendation.     Expected Discharge Plan: Home/Self Care Barriers to Discharge: Continued Medical Work up  Expected Discharge Plan and Services       Living arrangements for the past 2 months: Single Family Home                           HH Arranged: PT HH Agency: [facility] Date HH Agency Contacted: 02/16/23 Time HH Agency Contacted: 1559 Representative spoke with at [facility] Agency: [facility]   Social Determinants of Health (SDOH) Interventions SDOH Screenings   Food Insecurity: No Food Insecurity (02/16/2023)  Housing: Low Risk  (02/16/2023)  Transportation Needs: No Transportation Needs (02/16/2023)  Utilities: Not At Risk (02/16/2023)  Alcohol Screen: Low Risk  (10/02/2020)  Depression (PHQ2-9): Medium Risk (10/02/2020)  Financial Resource Strain: Low Risk  (01/26/2023)   Received from [facility]  Physical Activity: Insufficiently Active (01/01/2020)  Social Connections: Moderately Isolated (01/01/2020)  Stress: No Stress Concern Present (01/01/2020)  Tobacco Use: Low Risk  (02/15/2023)  Recent Concern: Tobacco Use - Medium Risk (02/15/2023)   Received from [facility]    Readmission Risk Interventions    02/16/2023    3:56 PM 08/24/2022   10:57 AM 05/20/2022   11:41 AM  Readmission Risk Prevention Plan  Transportation Screening Complete Complete Complete  Medication Review Oceanographer) Complete Complete Complete  PCP or Specialist appointment within 3-5 days of discharge Complete Complete Complete  HRI or Home Care Consult  Complete Complete Complete  SW Recovery Care/Counseling Consult Not Complete    Palliative Care Screening Not Applicable  Complete  Skilled Nursing Facility Complete Complete Complete

## 2023-02-18 NOTE — Progress Notes (Signed)
When I arrived onto shift, heard patient screaming and cursing in room. Security had been called and was outside the room. While getting report from day nurse, charge nurse was contacting doctor. Looked at PRN list of meds and saw that patient had prn meds for anxiety and agitation. Gave patient night meds and prn ativan. Assessed some racial bias with patient when he referred to "white folks". No NIH scale orders in. Patient refused orthostatics, SCDs and q2h turns. Educated but did not want to stimulate patient to become agitated again. Patient rested what seemed to be comfortably throughout the night. No additional needs voiced.

## 2023-02-18 NOTE — Progress Notes (Signed)
[facility] Progress Note  Patient: Nicholas Weiss    XBJ:[id number]  DOA: 02/15/2023     Date of Service: the patient was seen and examined on 02/18/2023  Chief Complaint  Patient presents with   Loss of Consciousness   Brief hospital course: Nicholas Weiss is a 72 y.o. African-American male with PMH of paroxysmal A-fib, type 1 diabetes mellitus, dysautonomia/orthostatic hypotension, hypertension, dyslipidemia, CVA, multiple myeloma, myoclonus/asterixis was seen by Neuro and started keppra,  , who presented to the emergency room with acute onset of recurrent syncope.  It has been occasionally associated with hypoglycemia.  Few days ago he fell and hit his head yesterday he had a syncopal episode without injuries.  He denied any headache or dizziness or blurred vision.  No paresthesias or focal muscle weakness.  He reported having a rib fracture from recent fall.  He has been having right-sided chest/rib pain without palpitations.  No cough or wheezing or dyspnea.  No dysuria, oliguria or hematuria or flank pain.  No bleeding diathesis.   ED Course: When he came to the ER, BP was 171/99 with otherwise normal vital signs.  Labs revealed hyponatremia of 129 and hyperglycemia of 413 with a BUN of 39 creatinine 2.17 comparable to previous levels and high sensitive troponin I was 18.  CBC showed mild leukopenia of 3.2 and anemia close to baseline.   EKG as reviewed by me : Normal sinus rhythm with rate of 81 without acute findings.  QTc was 548 MS. Imaging: On 02/12/2023: Rib x-ray showed minimally displaced fracture of the posterolateral right 10th rib with no acute cardiopulmonary disease.   Noncontrasted CT scan and C-spine CT: 1. Small amount of hyperdense hemorrhage in the right frontal horn, which is new from the 02/12/2023 exam, of indeterminate etiology. No hydrocephalus. 2. No acute fracture or traumatic listhesis in the cervical spine.   - Head  and neck CTA:  1. No evidence of  active extravasation in the right frontal horn hemorrhage. 2. No intracranial large vessel occlusion or significant stenosis.  3. 50% stenosis in the proximal bilateral ICAs, just distal to the bifurcations. 4. Severe stenosis at the origin of the right vertebral artery. 5. Multiple dural based enhancing lesions, the largest of which measures up to 9 mm, which correlate with calcified lesions on the prior head CT, most likely small meningiomas, without significant mass effect. 6. Aortic atherosclerosis.   6 hours later repeat head CT scan without contrast showed stable hemorrhage in the anterior horn of the right lateral ventricle.   The patient was given 5 mg oxycodone, 5 units of subcutaneous NovoLog and 1 L bolus of IV normal saline.  Dr. Katrinka Blazing with neurosurgery was notified about the patient.  He will be admitted to a stepdown unit bed for further evaluation and management.   Assessment and Plan:   # Intraventricular hematoma, s/p fall/syncope due to orthostatic hypotension Neurosurgery consulted, recommended conservative management Repeat CT head stable. As per neurosurgery no more scan needed, it should be self-limited and no follow-up needed with neurosurgery.  If situation change then we can repeat CT head and he may follow-up with neurosurgery Hold Eliquis for 2 weeks, if there is a strong indication for him to be on it.   # Orthostatic hypotension/dysautonomia Started midodrine 10 mg p.o. 3 times daily with holding parameters Started Florinef 0.1 mg p.o. daily with holding parameters Monitor BP and titrate medications accordingly  Paroxysmal A-fib Patient was seen by cardiology in the past,  aspirin was discontinued and patient was started on Eliquis 5 mg p.o. twice daily  # Hypertension Avoid low BP due to orthostatics Use hydralazine 25 mg p.o. TID prn if SBP > 50 mmHg Monitor BP and titrate medications accordingly   Uncontrolled diabetes mellitus with hyperglycemia Type  1 diabetes mellitus Continue Semglee 5 units daily, NovoLog sliding scale, monitor CBG Diabetic coordinator consulted Continue diabetic diet  CKD stage IIIb Baseline creatinine 1.7 Cr 2.16 Continue oral hydration and monitor urine output Avoid nephrotoxic medication, use renally dose medications.   Hyponatremia pseudohyponatremia due to hyperglycemia. monitor sodium level daily    Diabetic neuropathy  - continue Neurontin.   GERD without esophagitis - continue PPI therapy.   Symptomatic myoclonus and asterixis, involuntary movement Keppra 500mg  BID for symptomatic myoclonus was started by Neurologist on 01/15/2023  H/o CVA, left subcortical infarct, continue Statin ASA was stopped and pt was started on Eliquis, due to paroxysmal A-fib H/o Multiple myeloma, s/p  chemo, recommended to continue to follow with oncologist as an outpatient   Anemia of chronic disease Iron, folate and B12 within normal range Monitor H&H Transfuse if hemoglobin less than 7   Vitamin D Insufficiency: started vitamin D 50,000 units p.o. weekly, follow with PCP to repeat vitamin D level after 3 to 6 months.   Body mass index is 19.74 kg/m.  Interventions:  Diet: Carb modified diet DVT Prophylaxis: SCD, pharmacological prophylaxis contraindicated due to ICH    Advance goals of care discussion: Full code  Family Communication: family was not present at bedside, at the time of interview.  The pt provided permission to discuss medical plan with the family. Opportunity was given to ask question and all questions were answered satisfactorily.  11/21 discussed with patient's wife over the phone  Disposition:  Pt is from Home, admitted with intraventricular hemorrhage, orthostatic hypotension, syncope, still has orthostatic vitals, which precludes a safe discharge. Discharge to home, when stable, may need few days to improve.  Subjective: No significant events overnight, patient said that nobody  is allowing him to walk by himself.  Patient was counseled that he is at risk for fall, so always call RN to supervise and wait for PT and OT evaluation. Patient's orthostatics improved, denies any dizziness while standing up, blood pressure did drop a little bit.  We will continue to monitor today and plan for disposition tomorrow a.m.   Physical Exam: General: NAD, lying comfortably Appear in no distress, affect appropriate Eyes: PERRLA ENT: Oral Mucosa Clear, moist  Neck: no JVD,  Cardiovascular: S1 and S2 Present, no Murmur,  Respiratory: good respiratory effort, Bilateral Air entry equal and Decreased, no Crackles, no wheezes Abdomen: Bowel Sound present, Soft and no tenderness,  Skin: no rashes Extremities: no Pedal edema, no calf tenderness, s/p left first and second toe amputation Neurologic: without any new focal findings Gait not checked due to patient safety concerns  Vitals:   02/17/23 1700 02/17/23 1900 02/18/23 0423 02/18/23 0806  BP: 138/81 138/76 121/77 128/76  Pulse: 66 71 71 69  Resp: 18 18 18 17   Temp: (!) 97.5 F (36.4 C) (!) 97 F (36.1 C) (!) 97.4 F (36.3 C) 97.6 F (36.4 C)  TempSrc: Oral Axillary  Oral  SpO2: 100% 100% 100% 100%  Weight:      Height:        Intake/Output Summary (Last 24 hours) at 02/18/2023 1427 Last data filed at 02/17/2023 2000 Gross per 24 hour  Intake 120 ml  Output --  Net 120 ml   Filed Weights   02/15/23 1713 02/16/23 0545 02/17/23 0044  Weight: 64 kg 65 kg 62.4 kg    Data Reviewed: I have personally reviewed and interpreted daily labs, tele strips, imagings as discussed above. I reviewed all nursing notes, pharmacy notes, vitals, pertinent old records I have discussed plan of care as described above with RN and patient/family.  CBC: Recent Labs  Lab 02/12/23 1210 02/15/23 1826 02/16/23 0827 02/18/23 0448  WBC 3.9* 3.2* 3.1* 3.2*  NEUTROABS 2.3  --  1.6* 1.5*  HGB 9.5* 9.1* 9.6* 8.5*  HCT 29.5* 27.9* 29.7*  25.8*  MCV 94.9 94.3 93.7 90.8  PLT 214 226 225 244   Basic Metabolic Panel: Recent Labs  Lab 02/12/23 1210 02/15/23 1826 02/16/23 0827 02/18/23 0448  NA 132* 129* 135 134*  K 4.8 5.1 4.7 4.6  CL 103 99 103 104  CO2 23 22 24  21*  GLUCOSE 155* 413* 226* 158*  BUN 42* 39* 35* 51*  CREATININE 2.20* 2.17* 1.72* 2.16*  CALCIUM 8.8* 8.6* 8.6* 8.5*  MG  --  2.2  --  2.3  PHOS  --   --   --  3.6    Studies: No results found.  Scheduled Meds:   stroke: early stages of recovery book   Does not apply Once   atorvastatin  80 mg Oral QHS   Chlorhexidine Gluconate Cloth  6 each Topical Q0600   feeding supplement  237 mL Oral BID BM   fludrocortisone  0.1 mg Oral Daily   gabapentin  300 mg Oral BID   insulin aspart  0-5 Units Subcutaneous QHS   insulin aspart  0-9 Units Subcutaneous TID WC   insulin glargine-yfgn  5 Units Subcutaneous Daily   levETIRAcetam  500 mg Oral BID   midodrine  10 mg Oral TID WC   multivitamin with minerals  1 tablet Oral Daily   mupirocin ointment  1 Application Nasal BID   pantoprazole  40 mg Oral QHS   QUEtiapine  25 mg Oral BID   senna-docusate  1 tablet Oral BID   Vitamin D (Ergocalciferol)  50,000 Units Oral Q7 days   Continuous Infusions: PRN Meds: acetaminophen **OR** acetaminophen (TYLENOL) oral liquid 160 mg/5 mL **OR** acetaminophen, hydrALAZINE, LORazepam, ondansetron (ZOFRAN) IV, traMADol, traZODone, ziprasidone  Time spent: 55 minutes  Author: Gillis Santa. MD [facility] 02/18/2023 2:27 PM  To reach On-call, see care teams to locate the attending and reach out to them via [email or web address]. If 7PM-7AM, please contact night-coverage If you still have difficulty reaching the attending provider, please page the [facility] (Director on Call) for [facility] on amion for assistance.

## 2023-02-18 NOTE — Inpatient Diabetes Management (Signed)
Inpatient Diabetes Program Recommendations  AACE/ADA: New Consensus Statement on Inpatient Glycemic Control (2015)  Target Ranges:  Prepandial:   less than 140 mg/dL      Peak postprandial:   less than 180 mg/dL (1-2 hours)      Critically ill patients:  140 - 180 mg/dL    Latest Reference Range & Units 02/17/23 10:35 02/17/23 12:15 02/17/23 17:30 02/17/23 20:58  Glucose-Capillary 70 - 99 mg/dL 161 (H)  20 units Novolog  383 (H)    5 units Semglee 125 (H)  1 unit Novolog  128 (H)  (H): Data is abnormally high  Latest Reference Range & Units 02/18/23 09:27 02/18/23 11:57  Glucose-Capillary 70 - 99 mg/dL 096 (H)  2 units Novolog  5 units Semglee  262 (H)  5 units Novolog    (H): Data is abnormally high     Home DM Meds: Tresiba 5 units every day      Humalog 0-9 units TID   Current Orders: Semglee 5 units daily      Novolog Sensitive Correction Scale/ SSI (0-9 units) TID AC + HS     MD- Note Semglee insulin started yesterday  CBG at 12pm today 262  May consider starting Novolog Meal Coverage: Novolog 3 units TID with meals HOLD if pt NPO HOLD if pt eats <50% meals    --Will follow patient during hospitalization--  Ambrose Finland RN, MSN, CDCES Diabetes Coordinator Inpatient Glycemic Control Team Team Pager: [phone] (8a-5p)

## 2023-02-18 NOTE — Progress Notes (Signed)
Occupational Therapy Treatment Patient Details Name: Nicholas Weiss MRN: [id number] DOB: 1950/09/18 Today's Date: 02/18/2023   History of present illness Nicholas Weiss is a 72 y.o. African-American male with medical history significant for hypertension, dyslipidemia and type 2 diabetes mellitus, who presented to the emergency room with acute onset of recurrent syncope.  It has been occasionally associated with hypoglycemia.  Few days ago he fell and hit his head yesterday he had a syncopal episode without injuries.  He denied any headache or dizziness or blurred vision.  No paresthesias or focal muscle weakness.  He reported having a R rib fracture from recent fall. (Simultaneous filing. User may not have seen previous data.)   OT comments  Nicholas Weiss was seen for OT treatment on this date. Upon arrival to room pt in bed with blankets over head, agreeable to tx with encouragement. Pt requires CGA for bed mobility and face washing in long sitting. MAX A don abdominal binder in long sitting. MIN A sit<>stand, decreased alertness in standing with +orthostatics noted, assist to return to bed and BP on return to bed 113/77. Poor insight into syncope. Educated on falls risk and left with bed alarm on. Pt making progress toward goals, will continue to follow POC. Discharge recommendation remains appropriate.   Orthostatic VS for the past 24 hrs:  BP- Lying Pulse- Lying BP- Sitting Pulse- Sitting BP- Standing at 0 minutes Pulse- Standing at 0 minutes  02/18/23 1502 115/77 -- (!) 84/61 -- (!) 53/33 --        If plan is discharge home, recommend the following:  A lot of help with walking and/or transfers;A lot of help with bathing/dressing/bathroom;Assistance with cooking/housework;Direct supervision/assist for financial management;Assist for transportation;Direct supervision/assist for medications management;Help with stairs or ramp for entrance   Equipment Recommendations  Other (comment) (defer)     Recommendations for Other Services      Precautions / Restrictions Precautions Precautions: Fall Required Braces or Orthoses: Other Brace Other Brace: Abdominal binder, B TED hose Restrictions Weight Bearing Restrictions: No       Mobility Bed Mobility Overal bed mobility: Needs Assistance Bed Mobility: Supine to Sit, Sit to Supine     Supine to sit: Contact guard Sit to supine: Min assist        Transfers Overall transfer level: Needs assistance Equipment used: 1 person hand held assist Transfers: Sit to/from Stand Sit to Stand: Min assist, +2 safety/equipment           General transfer comment: significant orthostatics in standing requiring assist to return to bed     Balance Overall balance assessment: Needs assistance, History of Falls Sitting-balance support: Feet supported Sitting balance-Leahy Scale: Fair     Standing balance support: No upper extremity supported Standing balance-Leahy Scale: Poor                             ADL either performed or assessed with clinical judgement   ADL Overall ADL's : Needs assistance/impaired                                       General ADL Comments: CGA face washing in long sitting. MAX A don abdominal binder in long sitting.      Cognition Arousal: Lethargic Behavior During Therapy: Flat affect Overall Cognitive Status: No family/caregiver present to determine baseline cognitive functioning  General Comments: Poor insight and safety awareness                   Pertinent Vitals/ Pain       Pain Assessment Pain Assessment: No/denies pain   Frequency  Min 1X/week        Progress Toward Goals  OT Goals(current goals can now be found in the care plan section)  Progress towards OT goals: Progressing toward goals  Acute Rehab OT Goals Patient Stated Goal: to return to PLOF OT Goal Formulation: With patient Time For  Goal Achievement: 03/03/23 Potential to Achieve Goals: Good ADL Goals Pt Will Perform Grooming: with modified independence;standing Pt Will Perform Lower Body Dressing: with modified independence;sit to/from stand Pt Will Transfer to Toilet: with modified independence;ambulating;regular height toilet Pt Will Perform Toileting - Clothing Manipulation and hygiene: with modified independence;sitting/lateral leans  Plan      Co-evaluation      Reason for Co-Treatment: For patient/therapist safety;To address functional/ADL transfers PT goals addressed during session: Mobility/safety with mobility;Balance        AM-PAC OT "6 Clicks" Daily Activity     Outcome Measure   Help from another person eating meals?: None Help from another person taking care of personal grooming?: A Little Help from another person toileting, which includes using toliet, bedpan, or urinal?: A Little Help from another person bathing (including washing, rinsing, drying)?: A Lot Help from another person to put on and taking off regular upper body clothing?: A Little Help from another person to put on and taking off regular lower body clothing?: A Lot 6 Click Score: 17    End of Session    OT Visit Diagnosis: Other abnormalities of gait and mobility (R26.89);Repeated falls (R29.6)   Activity Tolerance Treatment limited secondary to medical complications (Comment) (orthostatic)   Patient Left in bed;with call bell/phone within reach;with bed alarm set;with nursing/sitter in room   Nurse Communication Mobility status        Time: 0981-1914 OT Time Calculation (min): 16 min  Charges: OT General Charges $OT Visit: 1 Visit OT Treatments $Self Care/Home Management : 8-22 mins  Kathie Dike, M.S. OTR/L  02/18/23, 3:08 PM  ascom [phone]

## 2023-02-18 NOTE — Progress Notes (Signed)
Physical Therapy Treatment Patient Details Name: Nicholas Weiss MRN: [id number] DOB: 1950-12-21 Today's Date: 02/18/2023   History of Present Illness Nicholas Weiss is a 72 y.o. African-American male with medical history significant for hypertension, dyslipidemia and type 2 diabetes mellitus, who presented to the emergency room with acute onset of recurrent syncope.  It has been occasionally associated with hypoglycemia.  Few days ago he fell and hit his head yesterday he had a syncopal episode without injuries.  He denied any headache or dizziness or blurred vision.  No paresthesias or focal muscle weakness.  He reported having a R rib fracture from recent fall. (Simultaneous filing. User may not have seen previous data.)    PT Comments  Patient received in bed, head covered with blankets. Took max encouragement to get him to participate. Patient very lethargic, flat affect. He is mod I with bed mobility. Donned abdominal brace prior to activity and patient had B TED hose donned. Patient reports he feels fine. BP taken throughout session. He is very orthostatic, and has poor awareness of this. Patient with poor standing balance requiring assistance due to posterior leaning. He will continue to benefit from skilled PT as able. Currently he is unable to tolerate due to hypotension.         If plan is discharge home, recommend the following: A lot of help with walking and/or transfers;A little help with bathing/dressing/bathroom;Help with stairs or ramp for entrance;Assist for transportation;Direct supervision/assist for medications management   Can travel by private vehicle     Yes  Equipment Recommendations  Other (comment) (TBD)    Recommendations for Other Services       Precautions / Restrictions Precautions Precautions: Fall Required Braces or Orthoses: Other Brace Other Brace: Abdominal binder, B TED hose Restrictions Weight Bearing Restrictions: No     Mobility  Bed  Mobility Overal bed mobility: Modified Independent Bed Mobility: Supine to Sit, Sit to Supine     Supine to sit: Modified independent (Device/Increase time) Sit to supine: Modified independent (Device/Increase time)   General bed mobility comments: Patient requires max encouragement to participate. Flat affect. Barely opens eyes during session.    Transfers Overall transfer level: Needs assistance Equipment used: None Transfers: Sit to/from Stand Sit to Stand: Contact guard assist           General transfer comment: Patient with posterior leaning in standing and swaying while taking BP. Reports he feels "fine" BP 53/33 in standing.    Ambulation/Gait               General Gait Details: unsafe at this time due to very low BP in standing   Stairs             Wheelchair Mobility     Tilt Bed    Modified Rankin (Stroke Patients Only)       Balance Overall balance assessment: Needs assistance, History of Falls Sitting-balance support: Feet supported Sitting balance-Leahy Scale: Good     Standing balance support: No upper extremity supported Standing balance-Leahy Scale: Poor Standing balance comment: posterior leaning in standing, requiring assist to maintain balance to take BP                            Cognition Arousal: Lethargic Behavior During Therapy: Flat affect Overall Cognitive Status: No family/caregiver present to determine baseline cognitive functioning Area of Impairment: Safety/judgement, Awareness, Following commands  Following Commands: Follows one step commands with increased time Safety/Judgement: Decreased awareness of safety, Decreased awareness of deficits Awareness: Intellectual Problem Solving: Slow processing, Decreased initiation, Requires verbal cues, Requires tactile cues          Exercises      General Comments        Pertinent Vitals/Pain Pain Assessment Pain  Assessment: No/denies pain    Home Living                          Prior Function            PT Goals (current goals can now be found in the care plan section) Acute Rehab PT Goals Patient Stated Goal: none stated PT Goal Formulation: With patient Time For Goal Achievement: 03/03/23 Progress towards PT goals: Not progressing toward goals - comment (limited by hypotension)    Frequency    Min 1X/week      PT Plan      Co-evaluation PT/OT/SLP Co-Evaluation/Treatment: Yes Reason for Co-Treatment: For patient/therapist safety;To address functional/ADL transfers PT goals addressed during session: Mobility/safety with mobility;Balance        AM-PAC PT "6 Clicks" Mobility   Outcome Measure  Help needed turning from your back to your side while in a flat bed without using bedrails?: None Help needed moving from lying on your back to sitting on the side of a flat bed without using bedrails?: None Help needed moving to and from a bed to a chair (including a wheelchair)?: A Little Help needed standing up from a chair using your arms (e.g., wheelchair or bedside chair)?: A Little Help needed to walk in hospital room?: Total Help needed climbing 3-5 steps with a railing? : Total 6 Click Score: 16    End of Session   Activity Tolerance: Treatment limited secondary to medical complications (Comment);Other (comment) (patient unable to progress to ambulaton at this time due to very low BP in standing.) Patient left: in bed;with bed alarm set;with call bell/phone within reach Nurse Communication: Mobility status;Other (comment) (orthostasis) PT Visit Diagnosis: Other abnormalities of gait and mobility (R26.89);Repeated falls (R29.6);Dizziness and giddiness (R42)     Time: 1610-9604 PT Time Calculation (min) (ACUTE ONLY): 12 min  Charges:      PT General Charges $$ ACUTE PT VISIT: 1 Visit                     Coltyn Hanning, PT, GCS 02/18/23,3:10 PM

## 2023-02-18 NOTE — Progress Notes (Signed)
Mobility Specialist - Progress Note   02/18/23 1045  Orthostatic Lying   BP- Lying (!) 145/94  Pulse- Lying 84  Orthostatic Sitting  BP- Sitting (!) 126/111  Pulse- Sitting 127  Orthostatic Standing at 0 minutes  BP- Standing at 0 minutes (!) 109/99  Pulse- Standing at 0 minutes 94  Orthostatic Standing at 3 minutes  BP- Standing at 3 minutes (!) 136/106  Pulse- Standing at 3 minutes 129  Mobility  Activity Stood at bedside;Ambulated with assistance in room;Dangled on edge of bed  Level of Assistance Minimal assist, patient does 75% or more  Assistive Device Front wheel walker;None  Distance Ambulated (ft) 5 ft  Activity Response Tolerated well  $Mobility charge 1 Mobility     Pt lying in bed upon arrival, utilizing RA. Pt agreeable to activity. Orthostatics recorded above. Reports pain in rib area. Pt completed bed mobility modI. STS with minG as pt requests to attempt without CGA. Lateral stepping with CGA. RW was placed in front of pt but pt refused use. Denied dizziness throughout session, but did display some signs of dizziness like swaying and hunched over posture as time progressed. Initially fair balance most of session; 1 episode of significant LOB x1 with modA +2 to recover when trying to descend to bed. Pt returned semi-supine with alarm set, needs in reach. RN notified.   Filiberto Pinks Mobility Specialist 02/18/23, 11:24 AM

## 2023-02-18 NOTE — Telephone Encounter (Signed)
Nicholas Weiss is trying to see if pt will come tom. For treatment. I called [facility] and asked if pt will be getting discharged today. Per staff there is no order to let him be discharged today. Therefore pt. Will not be here in am because he is in the hospital . We will need to refer his appt to another date

## 2023-02-18 NOTE — Plan of Care (Signed)
  Problem: Education: Goal: Knowledge of disease or condition will improve Outcome: Progressing Goal: Knowledge of secondary prevention will improve (MUST DOCUMENT ALL) Outcome: Progressing Goal: Knowledge of patient specific risk factors will improve Loraine Leriche N/A or DELETE if not current risk factor) Outcome: Progressing   Problem: Intracerebral Hemorrhage Tissue Perfusion: Goal: Complications of Intracerebral Hemorrhage will be minimized Outcome: Progressing   Problem: Coping: Goal: Will verbalize positive feelings about self Outcome: Progressing Goal: Will identify appropriate support needs Outcome: Progressing   Problem: Health Behavior/Discharge Planning: Goal: Ability to manage health-related needs will improve Outcome: Progressing Goal: Goals will be collaboratively established with patient/family Outcome: Progressing   Problem: Self-Care: Goal: Ability to participate in self-care as condition permits will improve Outcome: Progressing Goal: Verbalization of feelings and concerns over difficulty with self-care will improve Outcome: Progressing Goal: Ability to communicate needs accurately will improve Outcome: Progressing   Problem: Nutrition: Goal: Risk of aspiration will decrease Outcome: Progressing Goal: Dietary intake will improve Outcome: Progressing   Problem: Education: Goal: Knowledge of General Education information will improve Description: Including pain rating scale, medication(s)/side effects and non-pharmacologic comfort measures Outcome: Progressing   Problem: Health Behavior/Discharge Planning: Goal: Ability to manage health-related needs will improve Outcome: Progressing   Problem: Clinical Measurements: Goal: Ability to maintain clinical measurements within normal limits will improve Outcome: Progressing Goal: Will remain free from infection Outcome: Progressing Goal: Diagnostic test results will improve Outcome: Progressing Goal:  Respiratory complications will improve Outcome: Progressing Goal: Cardiovascular complication will be avoided Outcome: Progressing   Problem: Activity: Goal: Risk for activity intolerance will decrease Outcome: Progressing   Problem: Nutrition: Goal: Adequate nutrition will be maintained Outcome: Progressing   Problem: Coping: Goal: Level of anxiety will decrease Outcome: Progressing   Problem: Elimination: Goal: Will not experience complications related to bowel motility Outcome: Progressing Goal: Will not experience complications related to urinary retention Outcome: Progressing   Problem: Pain Management: Goal: General experience of comfort will improve Outcome: Progressing   Problem: Safety: Goal: Ability to remain free from injury will improve Outcome: Progressing   Problem: Skin Integrity: Goal: Risk for impaired skin integrity will decrease Outcome: Progressing   Problem: Education: Goal: Ability to describe self-care measures that may prevent or decrease complications (Diabetes Survival Skills Education) will improve Outcome: Progressing Goal: Individualized Educational Video(s) Outcome: Progressing   Problem: Coping: Goal: Ability to adjust to condition or change in health will improve Outcome: Progressing   Problem: Fluid Volume: Goal: Ability to maintain a balanced intake and output will improve Outcome: Progressing   Problem: Health Behavior/Discharge Planning: Goal: Ability to identify and utilize available resources and services will improve Outcome: Progressing Goal: Ability to manage health-related needs will improve Outcome: Progressing   Problem: Metabolic: Goal: Ability to maintain appropriate glucose levels will improve Outcome: Progressing   Problem: Nutritional: Goal: Maintenance of adequate nutrition will improve Outcome: Progressing Goal: Progress toward achieving an optimal weight will improve Outcome: Progressing   Problem:  Skin Integrity: Goal: Risk for impaired skin integrity will decrease Outcome: Progressing   Problem: Tissue Perfusion: Goal: Adequacy of tissue perfusion will improve Outcome: Progressing

## 2023-02-19 ENCOUNTER — Inpatient Hospital Stay: Payer: 59 | Admitting: Oncology

## 2023-02-19 ENCOUNTER — Ambulatory Visit: Payer: 59

## 2023-02-19 ENCOUNTER — Inpatient Hospital Stay: Payer: 59

## 2023-02-19 DIAGNOSIS — I619 Nontraumatic intracerebral hemorrhage, unspecified: Secondary | ICD-10-CM | POA: Diagnosis not present

## 2023-02-19 LAB — CBC WITH DIFFERENTIAL/PLATELET
Abs Immature Granulocytes: 0.01 10*3/uL (ref 0.00–0.07)
Basophils Absolute: 0 10*3/uL (ref 0.0–0.1)
Basophils Relative: 0 %
Eosinophils Absolute: 0.1 10*3/uL (ref 0.0–0.5)
Eosinophils Relative: 2 %
HCT: 26.7 % — ABNORMAL LOW (ref 39.0–52.0)
Hemoglobin: 8.8 g/dL — ABNORMAL LOW (ref 13.0–17.0)
Immature Granulocytes: 0 %
Lymphocytes Relative: 40 %
Lymphs Abs: 1.1 10*3/uL (ref 0.7–4.0)
MCH: 30.4 pg (ref 26.0–34.0)
MCHC: 33 g/dL (ref 30.0–36.0)
MCV: 92.4 fL (ref 80.0–100.0)
Monocytes Absolute: 0.3 10*3/uL (ref 0.1–1.0)
Monocytes Relative: 11 %
Neutro Abs: 1.2 10*3/uL — ABNORMAL LOW (ref 1.7–7.7)
Neutrophils Relative %: 47 %
Platelets: 206 10*3/uL (ref 150–400)
RBC: 2.89 MIL/uL — ABNORMAL LOW (ref 4.22–5.81)
RDW: 14.8 % (ref 11.5–15.5)
WBC: 2.6 10*3/uL — ABNORMAL LOW (ref 4.0–10.5)
nRBC: 0 % (ref 0.0–0.2)

## 2023-02-19 LAB — BASIC METABOLIC PANEL
Anion gap: 8 (ref 5–15)
BUN: 46 mg/dL — ABNORMAL HIGH (ref 8–23)
CO2: 22 mmol/L (ref 22–32)
Calcium: 8.6 mg/dL — ABNORMAL LOW (ref 8.9–10.3)
Chloride: 106 mmol/L (ref 98–111)
Creatinine, Ser: 1.9 mg/dL — ABNORMAL HIGH (ref 0.61–1.24)
GFR, Estimated: 38 mL/min — ABNORMAL LOW (ref >60)
Glucose, Bld: 81 mg/dL (ref 70–99)
Potassium: 4.1 mmol/L (ref 3.5–5.1)
Sodium: 136 mmol/L (ref 135–145)

## 2023-02-19 LAB — GLUCOSE, CAPILLARY
Glucose-Capillary: 191 mg/dL — ABNORMAL HIGH (ref 70–99)
Glucose-Capillary: 304 mg/dL — ABNORMAL HIGH (ref 70–99)
Glucose-Capillary: 145 mg/dL — ABNORMAL HIGH (ref 70–99)
Glucose-Capillary: 82 mg/dL (ref 70–99)

## 2023-02-19 LAB — MAGNESIUM: Magnesium: 2.3 mg/dL (ref 1.7–2.4)

## 2023-02-19 LAB — PHOSPHORUS: Phosphorus: 4.1 mg/dL (ref 2.5–4.6)

## 2023-02-19 NOTE — TOC Progression Note (Signed)
Transition of Care [facility]) - Progression Note    Patient Details  Name: Nicholas Weiss MRN: [id number] Date of Birth: May 29, 1950  Transition of [facility]) CM/SW Contact  Allena Katz, LCSW Phone Number: 02/19/2023, 10:07 AM  Clinical Narrative:   Second attempt to reach patients friend sharon.    Expected Discharge Plan: Home/Self Care Barriers to Discharge: Continued Medical Work up  Expected Discharge Plan and Services       Living arrangements for the past 2 months: Single Family Home                           HH Arranged: PT HH Agency: [facility] Date HH Agency Contacted: 02/16/23 Time HH Agency Contacted: 1559 Representative spoke with at [facility] Agency: [facility]   Social Determinants of Health (SDOH) Interventions SDOH Screenings   Food Insecurity: No Food Insecurity (02/16/2023)  Housing: Low Risk  (02/16/2023)  Transportation Needs: No Transportation Needs (02/16/2023)  Utilities: Not At Risk (02/16/2023)  Alcohol Screen: Low Risk  (10/02/2020)  Depression (PHQ2-9): Medium Risk (10/02/2020)  Financial Resource Strain: Low Risk  (01/26/2023)   Received from [facility]  Physical Activity: Insufficiently Active (01/01/2020)  Social Connections: Moderately Isolated (01/01/2020)  Stress: No Stress Concern Present (01/01/2020)  Tobacco Use: Low Risk  (02/15/2023)  Recent Concern: Tobacco Use - Medium Risk (02/15/2023)   Received from [facility]    Readmission Risk Interventions    02/16/2023    3:56 PM 08/24/2022   10:57 AM 05/20/2022   11:41 AM  Readmission Risk Prevention Plan  Transportation Screening Complete Complete Complete  Medication Review Oceanographer) Complete Complete Complete  PCP or Specialist appointment within 3-5 days of discharge Complete Complete Complete  HRI or Home Care Consult Complete Complete Complete  SW Recovery Care/Counseling Consult Not Complete    Palliative Care Screening Not  Applicable  Complete  Skilled Nursing Facility Complete Complete Complete

## 2023-02-19 NOTE — Progress Notes (Addendum)
Mobility Specialist - Progress Note   02/19/23 1100  Orthostatic Lying   BP- Lying (!) 130/92  Pulse- Lying 86  Orthostatic Sitting  BP- Sitting 90/69  Pulse- Sitting 97  Orthostatic Standing at 0 minutes  BP- Standing at 0 minutes (!) 139/114  Pulse- Standing at 0 minutes 97  Orthostatic Standing at 3 minutes  BP- Standing at 3 minutes (!) 64/32  Pulse- Standing at 3 minutes 88  Mobility  Activity Stood at bedside;Ambulated with assistance in room;Dangled on edge of bed  Level of Assistance Contact guard assist, steadying assist  Assistive Device Front wheel walker  Distance Ambulated (ft) 5 ft  Range of Motion/Exercises Right leg;Left leg  Activity Response Tolerated well  $Mobility charge 1 Mobility     02/19/23 1100  Orthostatic Lying   BP- Lying (!) 130/92  Pulse- Lying 86  Orthostatic Sitting  BP- Sitting 90/69  Pulse- Sitting 97  Orthostatic Standing at 0 minutes  BP- Standing at 0 minutes (!) 139/114  Pulse- Standing at 0 minutes 97  Orthostatic Standing at 3 minutes  BP- Standing at 3 minutes (!) 64/32  Pulse- Standing at 3 minutes 88  Mobility  Activity Stood at bedside;Ambulated with assistance in room;Dangled on edge of bed  Level of Assistance Contact guard assist, steadying assist  Assistive Device Front wheel walker  Distance Ambulated (ft) 5 ft  Range of Motion/Exercises Right leg;Left leg  Activity Response Tolerated well  $Mobility charge 1 Mobility     Pt lying in bed upon arrival, utilizing RA. AO to person, place, and situation. Abdominal binder donned and ted hose in place. Pt appears lethargic, but is aroused and agreeable to activity. Orthostatics recorded above. Pt achieved EOB with minG. STS and lateral stepping towards HOB with CGA-minA +2 for safety. Mild LOB x1. Pt denied dizziness throughout session, but does sway with intermittent hunched over to posterior posture at times requiring cueing for correction. Pt still motivated to ambulate,  however, further activity discontinued d/t very low standing BP. Pt returned to bed with alarm set, needs in reach. RN notified.   Filiberto Pinks Mobility Specialist 02/19/23, 11:15 AM

## 2023-02-19 NOTE — Plan of Care (Signed)
  Problem: Education: Goal: Knowledge of disease or condition will improve Outcome: Progressing   Problem: Intracerebral Hemorrhage Tissue Perfusion: Goal: Complications of Intracerebral Hemorrhage will be minimized Outcome: Progressing   Problem: Health Behavior/Discharge Planning: Goal: Ability to manage health-related needs will improve Outcome: Progressing   Problem: Education: Goal: Knowledge of General Education information will improve Description: Including pain rating scale, medication(s)/side effects and non-pharmacologic comfort measures Outcome: Progressing

## 2023-02-19 NOTE — Inpatient Diabetes Management (Signed)
Inpatient Diabetes Program Recommendations  AACE/ADA: New Consensus Statement on Inpatient Glycemic Control (2015)  Target Ranges:  Prepandial:   less than 140 mg/dL      Peak postprandial:   less than 180 mg/dL (1-2 hours)      Critically ill patients:  140 - 180 mg/dL    Latest Reference Range & Units 02/19/23 07:57 02/19/23 11:45  Glucose-Capillary 70 - 99 mg/dL 119 (H)  2 units Novolog  304 (H)  7 units Novolog   (H): Data is abnormally high     Home DM Meds: Tresiba 5 units every day      Humalog 0-9 units TID    Current Orders: Semglee 5 units daily                            Novolog Sensitive Correction Scale/ SSI (0-9 units) TID AC + HS    MD- Note CBG at 12pm today 304   May consider starting Novolog Meal Coverage: Novolog 3 units TID with meals HOLD if pt NPO HOLD if pt eats <50% meals   --Will follow patient during hospitalization--  Ambrose Finland RN, MSN, CDCES Diabetes Coordinator Inpatient Glycemic Control Team Team Pager: [phone] (8a-5p)

## 2023-02-19 NOTE — Plan of Care (Signed)
Pt alert and confused at times. Orthostatic blood pressure completed this shift as ordered and were positive. Pt denies any c/o pain.  Problem: Education: Goal: Knowledge of disease or condition will improve Outcome: Progressing Goal: Knowledge of secondary prevention will improve (MUST DOCUMENT ALL) Outcome: Progressing Goal: Knowledge of patient specific risk factors will improve Loraine Leriche N/A or DELETE if not current risk factor) Outcome: Progressing   Problem: Intracerebral Hemorrhage Tissue Perfusion: Goal: Complications of Intracerebral Hemorrhage will be minimized Outcome: Progressing   Problem: Coping: Goal: Will verbalize positive feelings about self Outcome: Progressing Goal: Will identify appropriate support needs Outcome: Progressing   Problem: Health Behavior/Discharge Planning: Goal: Ability to manage health-related needs will improve Outcome: Progressing Goal: Goals will be collaboratively established with patient/family Outcome: Progressing   Problem: Self-Care: Goal: Ability to participate in self-care as condition permits will improve Outcome: Progressing Goal: Verbalization of feelings and concerns over difficulty with self-care will improve Outcome: Progressing Goal: Ability to communicate needs accurately will improve Outcome: Progressing   Problem: Nutrition: Goal: Risk of aspiration will decrease Outcome: Progressing Goal: Dietary intake will improve Outcome: Progressing   Problem: Education: Goal: Knowledge of General Education information will improve Description: Including pain rating scale, medication(s)/side effects and non-pharmacologic comfort measures Outcome: Progressing   Problem: Health Behavior/Discharge Planning: Goal: Ability to manage health-related needs will improve Outcome: Progressing   Problem: Clinical Measurements: Goal: Ability to maintain clinical measurements within normal limits will improve Outcome: Progressing Goal:  Will remain free from infection Outcome: Progressing Goal: Diagnostic test results will improve Outcome: Progressing Goal: Respiratory complications will improve Outcome: Progressing Goal: Cardiovascular complication will be avoided Outcome: Progressing   Problem: Activity: Goal: Risk for activity intolerance will decrease Outcome: Progressing   Problem: Nutrition: Goal: Adequate nutrition will be maintained Outcome: Progressing   Problem: Coping: Goal: Level of anxiety will decrease Outcome: Progressing   Problem: Elimination: Goal: Will not experience complications related to bowel motility Outcome: Progressing Goal: Will not experience complications related to urinary retention Outcome: Progressing   Problem: Pain Management: Goal: General experience of comfort will improve Outcome: Progressing   Problem: Safety: Goal: Ability to remain free from injury will improve Outcome: Progressing   Problem: Skin Integrity: Goal: Risk for impaired skin integrity will decrease Outcome: Progressing   Problem: Education: Goal: Ability to describe self-care measures that may prevent or decrease complications (Diabetes Survival Skills Education) will improve Outcome: Progressing Goal: Individualized Educational Video(s) Outcome: Progressing   Problem: Coping: Goal: Ability to adjust to condition or change in health will improve Outcome: Progressing   Problem: Fluid Volume: Goal: Ability to maintain a balanced intake and output will improve Outcome: Progressing   Problem: Health Behavior/Discharge Planning: Goal: Ability to identify and utilize available resources and services will improve Outcome: Progressing Goal: Ability to manage health-related needs will improve Outcome: Progressing   Problem: Metabolic: Goal: Ability to maintain appropriate glucose levels will improve Outcome: Progressing   Problem: Nutritional: Goal: Maintenance of adequate nutrition will  improve Outcome: Progressing Goal: Progress toward achieving an optimal weight will improve Outcome: Progressing   Problem: Skin Integrity: Goal: Risk for impaired skin integrity will decrease Outcome: Progressing   Problem: Tissue Perfusion: Goal: Adequacy of tissue perfusion will improve Outcome: Progressing

## 2023-02-19 NOTE — Care Management Important Message (Signed)
Important Message  Patient Details  Name: Nicholas Weiss MRN: [id number] Date of Birth: 1950-08-16   Important Message Given:  Yes - Medicare IM     Verita Schneiders Kynlee Koenigsberg 02/19/2023, 9:29 AM

## 2023-02-19 NOTE — Progress Notes (Signed)
SLP Cancellation Note  Patient Details Name: SYAIRE ANGLADE MRN: [id number] DOB: Feb 25, 1951   Cancelled treatment:       Reason Eval/Treat Not Completed: Fatigue/lethargy limiting ability to participate (Despite maximal multimodal attempts, pt unable to maintain alertness/attention to this writer.)  Pat Elicker B. Dreama Saa, M.S., CCC-SLP, Tree surgeon Certified Brain Injury Specialist [facility]  [facility] Office [phone] Ascom [phone] Fax [phone]

## 2023-02-19 NOTE — Progress Notes (Signed)
[facility] Progress Note  Patient: Nicholas Weiss    UEA:[id number]  DOA: 02/15/2023     Date of Service: the patient was seen and examined on 02/19/2023  Chief Complaint  Patient presents with   Loss of Consciousness   Brief hospital course: Nicholas Weiss is a 72 y.o. African-American male with PMH of paroxysmal A-fib, type 1 diabetes mellitus, dysautonomia/orthostatic hypotension, hypertension, dyslipidemia, CVA, multiple myeloma, myoclonus/asterixis was seen by Neuro and started keppra,  , who presented to the emergency room with acute onset of recurrent syncope.  It has been occasionally associated with hypoglycemia.  Few days ago he fell and hit his head yesterday he had a syncopal episode without injuries.  He denied any headache or dizziness or blurred vision.  No paresthesias or focal muscle weakness.  He reported having a rib fracture from recent fall.  He has been having right-sided chest/rib pain without palpitations.  No cough or wheezing or dyspnea.  No dysuria, oliguria or hematuria or flank pain.  No bleeding diathesis.   ED Course: When he came to the ER, BP was 171/99 with otherwise normal vital signs.  Labs revealed hyponatremia of 129 and hyperglycemia of 413 with a BUN of 39 creatinine 2.17 comparable to previous levels and high sensitive troponin I was 18.  CBC showed mild leukopenia of 3.2 and anemia close to baseline.   EKG as reviewed by me : Normal sinus rhythm with rate of 81 without acute findings.  QTc was 548 MS. Imaging: On 02/12/2023: Rib x-ray showed minimally displaced fracture of the posterolateral right 10th rib with no acute cardiopulmonary disease.   Noncontrasted CT scan and C-spine CT: 1. Small amount of hyperdense hemorrhage in the right frontal horn, which is new from the 02/12/2023 exam, of indeterminate etiology. No hydrocephalus. 2. No acute fracture or traumatic listhesis in the cervical spine.   - Head  and neck CTA:  1. No evidence of  active extravasation in the right frontal horn hemorrhage. 2. No intracranial large vessel occlusion or significant stenosis.  3. 50% stenosis in the proximal bilateral ICAs, just distal to the bifurcations. 4. Severe stenosis at the origin of the right vertebral artery. 5. Multiple dural based enhancing lesions, the largest of which measures up to 9 mm, which correlate with calcified lesions on the prior head CT, most likely small meningiomas, without significant mass effect. 6. Aortic atherosclerosis.   6 hours later repeat head CT scan without contrast showed stable hemorrhage in the anterior horn of the right lateral ventricle.   The patient was given 5 mg oxycodone, 5 units of subcutaneous NovoLog and 1 L bolus of IV normal saline.  Dr. Katrinka Blazing with neurosurgery was notified about the patient.  He will be admitted to a stepdown unit bed for further evaluation and management.   Assessment and Plan:   # Intraventricular hematoma, s/p fall/syncope due to orthostatic hypotension Neurosurgery consulted, recommended conservative management Repeat CT head stable. As per neurosurgery no more scan needed, it should be self-limited and no follow-up needed with neurosurgery.  If situation change then we can repeat CT head and he may follow-up with neurosurgery Hold Eliquis for 2 weeks, if there is a strong indication for him to be on it.  Resume Eliquis on 03/03/2023  # Orthostatic hypotension/dysautonomia Started midodrine 10 mg p.o. 3 times daily with holding parameters Started Florinef 0.1 mg p.o. daily with holding parameters Monitor BP and titrate medications accordingly  Paroxysmal A-fib Patient was seen by  cardiology in the past, aspirin was discontinued and patient was started on Eliquis 5 mg p.o. twice daily  # Hypertension Avoid low BP due to orthostatics Use hydralazine 25 mg p.o. TID prn if SBP > 50 mmHg Monitor BP and titrate medications accordingly   Uncontrolled diabetes  mellitus with hyperglycemia Type 1 diabetes mellitus Continue Semglee 5 units daily, NovoLog sliding scale, monitor CBG Diabetic coordinator consulted Continue diabetic diet  CKD stage IIIb Baseline creatinine 1.7 Cr 2.16--1.9 Continue oral hydration and monitor urine output Avoid nephrotoxic medication, use renally dose medications.   Hyponatremia, Resolved  pseudohyponatremia due to hyperglycemia. monitor sodium level daily    Diabetic neuropathy  - continue Neurontin.   GERD without esophagitis - continue PPI therapy.   Symptomatic myoclonus and asterixis, involuntary movement Keppra 500mg  BID for symptomatic myoclonus was started by Neurologist on 01/15/2023  H/o CVA, left subcortical infarct, continue Statin ASA was stopped and pt was started on Eliquis, due to paroxysmal A-fib H/o Multiple myeloma, s/p  chemo, recommended to continue to follow with oncologist as an outpatient   Anemia of chronic disease Iron, folate and B12 within normal range Monitor H&H Transfuse if hemoglobin less than 7   Vitamin D Insufficiency: started vitamin D 50,000 units p.o. weekly, follow with PCP to repeat vitamin D level after 3 to 6 months.   Body mass index is 19.74 kg/m.  Interventions:  Diet: Carb modified diet DVT Prophylaxis: SCD, pharmacological prophylaxis contraindicated due to ICH    Advance goals of care discussion: Full code  Family Communication: family was not present at bedside, at the time of interview.  The pt provided permission to discuss medical plan with the family. Opportunity was given to ask question and all questions were answered satisfactorily.  11/21 discussed with patient's friend over the phone 11/22 called and left VM for call back to discuss discharge plan  Disposition:  Pt is from Home, admitted with intraventricular hemorrhage, orthostatic hypotension, syncope, still has orthostatic vitals, which precludes a safe discharge. Discharge to  home, when stable, may need few days to improve.  Subjective: No significant events overnight, patient stated that he is feeling fine, and ready to go home. Patient has significant orthostatic hypotension, very high risk for fall, will discuss with patient's friend regarding living situation and then plan accordingly to avoid readmission.   Physical Exam: General: NAD, lying comfortably Appear in no distress, affect appropriate Eyes: PERRLA ENT: Oral Mucosa Clear, moist  Neck: no JVD,  Cardiovascular: S1 and S2 Present, no Murmur,  Respiratory: good respiratory effort, Bilateral Air entry equal and Decreased, no Crackles, no wheezes Abdomen: Bowel Sound present, Soft and no tenderness,  Skin: no rashes Extremities: no Pedal edema, no calf tenderness, s/p left first and second toe amputation Neurologic: without any new focal findings Gait not checked due to patient safety concerns  Vitals:   02/18/23 1628 02/18/23 2120 02/19/23 0411 02/19/23 0755  BP: (!) 153/90 133/80 122/71 (!) 166/95  Pulse: 67 64 68 75  Resp: 16  15 18   Temp: 97.8 F (36.6 C) 97.6 F (36.4 C) 97.8 F (36.6 C) 97.9 F (36.6 C)  TempSrc: Oral  Oral   SpO2: 98% 100% 98% 100%  Weight:      Height:        Intake/Output Summary (Last 24 hours) at 02/19/2023 1324 Last data filed at 02/19/2023 0750 Gross per 24 hour  Intake --  Output 200 ml  Net -200 ml   [employer]  02/15/23 1713 02/16/23 0545 02/17/23 0044  Weight: 64 kg 65 kg 62.4 kg    Data Reviewed: I have personally reviewed and interpreted daily labs, tele strips, imagings as discussed above. I reviewed all nursing notes, pharmacy notes, vitals, pertinent old records I have discussed plan of care as described above with RN and patient/family.  CBC: Recent Labs  Lab 02/15/23 1826 02/16/23 0827 02/18/23 0448 02/19/23 0524  WBC 3.2* 3.1* 3.2* 2.6*  NEUTROABS  --  1.6* 1.5* 1.2*  HGB 9.1* 9.6* 8.5* 8.8*  HCT 27.9* 29.7* 25.8* 26.7*   MCV 94.3 93.7 90.8 92.4  PLT 226 225 244 206   Basic Metabolic Panel: Recent Labs  Lab 02/15/23 1826 02/16/23 0827 02/18/23 0448 02/19/23 0524  NA 129* 135 134* 136  K 5.1 4.7 4.6 4.1  CL 99 103 104 106  CO2 22 24 21* 22  GLUCOSE 413* 226* 158* 81  BUN 39* 35* 51* 46*  CREATININE 2.17* 1.72* 2.16* 1.90*  CALCIUM 8.6* 8.6* 8.5* 8.6*  MG 2.2  --  2.3 2.3  PHOS  --   --  3.6 4.1    Studies: No results found.  Scheduled Meds:   stroke: early stages of recovery book   Does not apply Once   atorvastatin  80 mg Oral QHS   Chlorhexidine Gluconate Cloth  6 each Topical Q0600   feeding supplement  237 mL Oral BID BM   fludrocortisone  0.1 mg Oral Daily   gabapentin  300 mg Oral BID   insulin aspart  0-5 Units Subcutaneous QHS   insulin aspart  0-9 Units Subcutaneous TID WC   insulin glargine-yfgn  5 Units Subcutaneous Daily   levETIRAcetam  500 mg Oral BID   midodrine  10 mg Oral TID WC   multivitamin with minerals  1 tablet Oral Daily   mupirocin ointment  1 Application Nasal BID   pantoprazole  40 mg Oral QHS   QUEtiapine  25 mg Oral BID   senna-docusate  1 tablet Oral BID   Vitamin D (Ergocalciferol)  50,000 Units Oral Q7 days   Continuous Infusions: PRN Meds: acetaminophen **OR** acetaminophen (TYLENOL) oral liquid 160 mg/5 mL **OR** acetaminophen, hydrALAZINE, LORazepam, ondansetron (ZOFRAN) IV, traMADol, traZODone, ziprasidone  Time spent: 40 minutes  Author: Gillis Santa. MD [facility] 02/19/2023 1:24 PM  To reach On-call, see care teams to locate the attending and reach out to them via [email or web address]. If 7PM-7AM, please contact night-coverage If you still have difficulty reaching the attending provider, please page the [facility] (Director on Call) for [facility] on amion for assistance.

## 2023-02-19 NOTE — Plan of Care (Signed)
  Problem: Education: Goal: Knowledge of disease or condition will improve 02/19/2023 0617 by Jennelle Human, RN Outcome: Progressing 02/19/2023 0429 by Jennelle Human, RN Outcome: Progressing Goal: Knowledge of secondary prevention will improve (MUST DOCUMENT ALL) Outcome: Progressing   Problem: Intracerebral Hemorrhage Tissue Perfusion: Goal: Complications of Intracerebral Hemorrhage will be minimized 02/19/2023 0617 by Jennelle Human, RN Outcome: Progressing 02/19/2023 0429 by Jennelle Human, RN Outcome: Progressing   Problem: Health Behavior/Discharge Planning: Goal: Ability to manage health-related needs will improve Outcome: Progressing   Problem: Nutrition: Goal: Risk of aspiration will decrease Outcome: Progressing   Problem: Education: Goal: Knowledge of General Education information will improve Description: Including pain rating scale, medication(s)/side effects and non-pharmacologic comfort measures Outcome: Progressing   Problem: Clinical Measurements: Goal: Will remain free from infection Outcome: Progressing Goal: Cardiovascular complication will be avoided Outcome: Progressing

## 2023-02-20 ENCOUNTER — Inpatient Hospital Stay: Payer: 59

## 2023-02-20 DIAGNOSIS — I619 Nontraumatic intracerebral hemorrhage, unspecified: Secondary | ICD-10-CM | POA: Diagnosis not present

## 2023-02-20 LAB — CBC WITH DIFFERENTIAL/PLATELET
Abs Immature Granulocytes: 0.01 10*3/uL (ref 0.00–0.07)
Basophils Absolute: 0 10*3/uL (ref 0.0–0.1)
Basophils Relative: 0 %
Eosinophils Absolute: 0 10*3/uL (ref 0.0–0.5)
Eosinophils Relative: 1 %
HCT: 25.7 % — ABNORMAL LOW (ref 39.0–52.0)
Hemoglobin: 8.6 g/dL — ABNORMAL LOW (ref 13.0–17.0)
Immature Granulocytes: 0 %
Lymphocytes Relative: 34 %
Lymphs Abs: 0.9 10*3/uL (ref 0.7–4.0)
MCH: 30.6 pg (ref 26.0–34.0)
MCHC: 33.5 g/dL (ref 30.0–36.0)
MCV: 91.5 fL (ref 80.0–100.0)
Monocytes Absolute: 0.2 10*3/uL (ref 0.1–1.0)
Monocytes Relative: 9 %
Neutro Abs: 1.5 10*3/uL — ABNORMAL LOW (ref 1.7–7.7)
Neutrophils Relative %: 56 %
Platelets: 241 10*3/uL (ref 150–400)
RBC: 2.81 MIL/uL — ABNORMAL LOW (ref 4.22–5.81)
RDW: 14.8 % (ref 11.5–15.5)
WBC: 2.7 10*3/uL — ABNORMAL LOW (ref 4.0–10.5)
nRBC: 0 % (ref 0.0–0.2)

## 2023-02-20 LAB — BASIC METABOLIC PANEL
Anion gap: 7 (ref 5–15)
BUN: 42 mg/dL — ABNORMAL HIGH (ref 8–23)
CO2: 24 mmol/L (ref 22–32)
Calcium: 8.4 mg/dL — ABNORMAL LOW (ref 8.9–10.3)
Chloride: 104 mmol/L (ref 98–111)
Creatinine, Ser: 2 mg/dL — ABNORMAL HIGH (ref 0.61–1.24)
GFR, Estimated: 35 mL/min — ABNORMAL LOW (ref >60)
Glucose, Bld: 165 mg/dL — ABNORMAL HIGH (ref 70–99)
Potassium: 4.4 mmol/L (ref 3.5–5.1)
Sodium: 135 mmol/L (ref 135–145)

## 2023-02-20 LAB — GLUCOSE, CAPILLARY
Glucose-Capillary: 126 mg/dL — ABNORMAL HIGH (ref 70–99)
Glucose-Capillary: 172 mg/dL — ABNORMAL HIGH (ref 70–99)
Glucose-Capillary: 305 mg/dL — ABNORMAL HIGH (ref 70–99)
Glucose-Capillary: 212 mg/dL — ABNORMAL HIGH (ref 70–99)
Glucose-Capillary: 128 mg/dL — ABNORMAL HIGH (ref 70–99)

## 2023-02-20 LAB — MAGNESIUM: Magnesium: 2.2 mg/dL (ref 1.7–2.4)

## 2023-02-20 LAB — PHOSPHORUS: Phosphorus: 3.5 mg/dL (ref 2.5–4.6)

## 2023-02-20 MED ORDER — QUETIAPINE FUMARATE 25 MG PO TABS
50.0000 mg | ORAL_TABLET | Freq: Two times a day (BID) | ORAL | Status: DC
Start: 2023-02-20 — End: 2023-02-23
  Administered 2023-02-20 – 2023-02-23 (×6): 50 mg via ORAL
  Filled 2023-02-20 (×6): qty 2

## 2023-02-20 MED ORDER — SODIUM CHLORIDE 0.9 % IV SOLN: INTRAVENOUS | Status: AC

## 2023-02-20 NOTE — Progress Notes (Signed)
[facility] Progress Note  Patient: Nicholas Weiss    XBJ:[id number]  DOA: 02/15/2023     Date of Service: the patient was seen and examined on 02/20/2023  Chief Complaint  Patient presents with   Loss of Consciousness   Brief hospital course: CALEEL JUNGERS is a 72 y.o. African-American male with PMH of paroxysmal A-fib, type 1 diabetes mellitus, dysautonomia/orthostatic hypotension, hypertension, dyslipidemia, CVA, multiple myeloma, myoclonus/asterixis was seen by Neuro and started keppra,  , who presented to the emergency room with acute onset of recurrent syncope.  It has been occasionally associated with hypoglycemia.  Few days ago he fell and hit his head yesterday he had a syncopal episode without injuries.  He denied any headache or dizziness or blurred vision.  No paresthesias or focal muscle weakness.  He reported having a rib fracture from recent fall.  He has been having right-sided chest/rib pain without palpitations.  No cough or wheezing or dyspnea.  No dysuria, oliguria or hematuria or flank pain.  No bleeding diathesis.   ED Course: When he came to the ER, BP was 171/99 with otherwise normal vital signs.  Labs revealed hyponatremia of 129 and hyperglycemia of 413 with a BUN of 39 creatinine 2.17 comparable to previous levels and high sensitive troponin I was 18.  CBC showed mild leukopenia of 3.2 and anemia close to baseline.   EKG as reviewed by me : Normal sinus rhythm with rate of 81 without acute findings.  QTc was 548 MS. Imaging: On 02/12/2023: Rib x-ray showed minimally displaced fracture of the posterolateral right 10th rib with no acute cardiopulmonary disease.   Noncontrasted CT scan and C-spine CT: 1. Small amount of hyperdense hemorrhage in the right frontal horn, which is new from the 02/12/2023 exam, of indeterminate etiology. No hydrocephalus. 2. No acute fracture or traumatic listhesis in the cervical spine.   - Head  and neck CTA:  1. No evidence of  active extravasation in the right frontal horn hemorrhage. 2. No intracranial large vessel occlusion or significant stenosis.  3. 50% stenosis in the proximal bilateral ICAs, just distal to the bifurcations. 4. Severe stenosis at the origin of the right vertebral artery. 5. Multiple dural based enhancing lesions, the largest of which measures up to 9 mm, which correlate with calcified lesions on the prior head CT, most likely small meningiomas, without significant mass effect. 6. Aortic atherosclerosis.   6 hours later repeat head CT scan without contrast showed stable hemorrhage in the anterior horn of the right lateral ventricle.   The patient was given 5 mg oxycodone, 5 units of subcutaneous NovoLog and 1 L bolus of IV normal saline.  Dr. Katrinka Blazing with neurosurgery was notified about the patient.  He will be admitted to a stepdown unit bed for further evaluation and management.   Assessment and Plan:   # Intraventricular hematoma, s/p fall/syncope due to orthostatic hypotension Neurosurgery consulted, recommended conservative management Repeat CT head stable. As per neurosurgery no more scan needed, it should be self-limited and no follow-up needed with neurosurgery.  If situation change then we can repeat CT head and he may follow-up with neurosurgery Hold Eliquis for 2 weeks, if there is a strong indication for him to be on it.  11/23 patient had a fall last night and CT head was done which showed worsening of intraventricular bleeding but it was reviewed by neurosurgeon who recommended no change in follow-up as an outpatient for repeat CT scan, no further intervention. Continue  fall precautions   # Orthostatic hypotension/dysautonomia Started midodrine 10 mg p.o. 3 times daily with holding parameters Started Florinef 0.1 mg p.o. daily with holding parameters Monitor BP and titrate medications accordingly  Paroxysmal A-fib Patient was seen by cardiology in the past, aspirin was  discontinued and patient was started on Eliquis 5 mg p.o. twice daily  # Hypertension Avoid low BP due to orthostatics Use hydralazine 25 mg p.o. TID prn if SBP > 160 mmHg Monitor BP and titrate medications accordingly   Uncontrolled diabetes mellitus with hyperglycemia Type 1 diabetes mellitus Continue Semglee 5 units daily, NovoLog sliding scale, monitor CBG Diabetic coordinator consulted Continue diabetic diet  CKD stage IIIb Baseline creatinine 1.7 Cr 2.16--1.9--2.0 Continue oral hydration and monitor urine output Avoid nephrotoxic medication, use renally dose medications. 11/23 started NS 100 mL/h for 10 hours to hydrate and recheck renal functions tomorrow US renal: Prevoid volume 337 ml. Postvoid volume 102 ml. No hydronephrosis.   Hyponatremia, Resolved  pseudohyponatremia due to hyperglycemia. monitor sodium level daily    Diabetic neuropathy  - continue Neurontin.   GERD without esophagitis - continue PPI therapy.   Symptomatic myoclonus and asterixis, involuntary movement Keppra 500mg  BID for symptomatic myoclonus was started by Neurologist on 01/15/2023  H/o CVA, left subcortical infarct, continue Statin ASA was stopped and pt was started on Eliquis, due to paroxysmal A-fib H/o Multiple myeloma, s/p  chemo, recommended to continue to follow with oncologist as an outpatient   Anemia of chronic disease Iron, folate and B12 within normal range Monitor H&H Transfuse if hemoglobin less than 7   Vitamin D Insufficiency: started vitamin D 50,000 units p.o. weekly, follow with PCP to repeat vitamin D level after 3 to 6 months.   Body mass index is 19.74 kg/m.  Interventions:  Diet: Carb modified diet DVT Prophylaxis: SCD, pharmacological prophylaxis contraindicated due to ICH    Advance goals of care discussion: Full code  Family Communication: family was not present at bedside, at the time of interview.  The pt provided permission to discuss medical  plan with the family. Opportunity was given to ask question and all questions were answered satisfactorily.  11/21 discussed with patient's friend over the phone 11/23 discussed with patient's friend over the phone   Disposition:  Pt is from Home, admitted with intraventricular hemorrhage, orthostatic hypotension, syncope, still has orthostatic vitals, which precludes a safe discharge. Discharge to home 24/7 care VS SNF, when stable, may dc in 1-2 days. Follow TOC for disposition plan, patient may need SNF placement.  Subjective: Overnight patient fell and had unwitnessed fall.  He does not believe that he hit his head. Patient was belligerent and security was called. In the morning time patient was laying comfortably, did not remember out for any fall last night.  Patient was sleepy, denied any headache or dizziness, no nasal complaints.   Physical Exam: General: NAD, lying comfortably Appear in no distress, affect appropriate Eyes: PERRLA ENT: Oral Mucosa Clear, moist  Neck: no JVD,  Cardiovascular: S1 and S2 Present, no Murmur,  Respiratory: good respiratory effort, Bilateral Air entry equal and Decreased, no Crackles, no wheezes Abdomen: Bowel Sound present, Soft and no tenderness,  Skin: no rashes Extremities: no Pedal edema, no calf tenderness, s/p left first and second toe amputation Neurologic: without any new focal findings Gait not checked due to patient safety concerns  Vitals:   02/19/23 1952 02/20/23 0241 02/20/23 0832 02/20/23 1304  BP: 101/68 130/85 (!) 140/107 (!) 130/90  Pulse:  75 82 73 92  Resp: 17 16 18 18   Temp: 99.1 F (37.3 C) 98.4 F (36.9 C) (!) 97.4 F (36.3 C) (!) 97.5 F (36.4 C)  TempSrc: Oral Oral    SpO2: 97% 100% 100% 100%  Weight:      Height:        Intake/Output Summary (Last 24 hours) at 02/20/2023 1406 Last data filed at 02/20/2023 0716 Gross per 24 hour  Intake --  Output 400 ml  Net -400 ml   Filed Weights   02/15/23 1713  02/16/23 0545 02/17/23 0044  Weight: 64 kg 65 kg 62.4 kg    Data Reviewed: I have personally reviewed and interpreted daily labs, tele strips, imagings as discussed above. I reviewed all nursing notes, pharmacy notes, vitals, pertinent old records I have discussed plan of care as described above with RN and patient/family.  CBC: Recent Labs  Lab 02/15/23 1826 02/16/23 0827 02/18/23 0448 02/19/23 0524 02/20/23 0414  WBC 3.2* 3.1* 3.2* 2.6* 2.7*  NEUTROABS  --  1.6* 1.5* 1.2* 1.5*  HGB 9.1* 9.6* 8.5* 8.8* 8.6*  HCT 27.9* 29.7* 25.8* 26.7* 25.7*  MCV 94.3 93.7 90.8 92.4 91.5  PLT 226 225 244 206 241   Basic Metabolic Panel: Recent Labs  Lab 02/15/23 1826 02/16/23 0827 02/18/23 0448 02/19/23 0524 02/20/23 0414  NA 129* 135 134* 136 135  K 5.1 4.7 4.6 4.1 4.4  CL 99 103 104 106 104  CO2 22 24 21* 22 24  GLUCOSE 413* 226* 158* 81 165*  BUN 39* 35* 51* 46* 42*  CREATININE 2.17* 1.72* 2.16* 1.90* 2.00*  CALCIUM 8.6* 8.6* 8.5* 8.6* 8.4*  MG 2.2  --  2.3 2.3 2.2  PHOS  --   --  3.6 4.1 3.5    Studies: US RENAL  Result Date: 02/20/2023 CLINICAL DATA:  204092 CKD (chronic kidney disease) stage 3, GFR 30-59 ml/min (HCC) 161096 EXAM: RENAL / URINARY TRACT ULTRASOUND COMPLETE COMPARISON:  December 01, 2020 FINDINGS: Evaluation is limited by limited acoustic windows and shadowing bowel gas Right Kidney: Renal measurements: 9.3 x 3.7 x 4.5 cm = volume: 82 mL. Echogenicity is slightly increased, similar comparison to prior. No mass or hydronephrosis visualized. Left Kidney: Renal measurements: 9.2 x 5.4 x 5.8 cm = volume: 150 mL. Echogenicity within normal limits. No mass or hydronephrosis visualized. Bladder: Appears normal for degree of bladder distention. Prevoid volume 337 ML. Postvoid volume 102 ML. Other: None. IMPRESSION: 1. No hydronephrosis. 2. Postvoid residual of 102 mL. Electronically Signed   By: Meda Klinefelter M.D.   On: 02/20/2023 11:56   CT HEAD WO CONTRAST  ( )  Result Date: 02/20/2023 CLINICAL DATA:  72 year old male status post fall. Recent right frontal horn intraventricular hemorrhage after a fall. EXAM: CT HEAD WITHOUT CONTRAST TECHNIQUE: Contiguous axial images were obtained from the base of the skull through the vertex without intravenous contrast. RADIATION DOSE REDUCTION: This exam was performed according to the departmental dose-optimization program which includes automated exposure control, adjustment of the mA and/or kV according to patient size and/or use of iterative reconstruction technique. COMPARISON:  Head CT 02/16/2023 and earlier. FINDINGS: Brain: Ongoing right lateral intraventricular hemorrhage, with mild extension since 02/16/2023. Hyperdense blood now surrounds most of the right choroid plexus. Some of this has read distributed from the right frontal horn. But the hemorrhage volume has increased. Minimal extension into the 3rd ventricle (series 2, image 15). But stable ventricle size and configuration. No ventriculomegaly. Left lateral and 4th  ventricles remain spared. No transependymal edema. No superimposed midline shift, mass effect, evidence of mass lesion,or evidence of cortically based acute infarction. Patchy bilateral white matter hypodensity is stable. Vascular: No suspicious intracranial vascular hyperdensity. Calcified atherosclerosis at the skull base. Skull: Stable and intact. Sinuses/Orbits: Visualized paranasal sinuses and mastoids are stable and well aerated. Other: Stable orbit and scalp soft tissues. No acute injury identified. IMPRESSION: 1. Increased volume of right lateral intraventricular hemorrhage since 02/16/2023, now moderate. But still the blood is fairly isolated to that ventricle; minimal extension into the 3rd ventricle. No associated ventriculomegaly or transependymal edema. 2. Underlying chronic small vessel disease. No other acute intracranial abnormality. Electronically Signed   By: Odessa Fleming M.D.   On:  02/20/2023 05:04    Scheduled Meds:   stroke: early stages of recovery book   Does not apply Once   atorvastatin  80 mg Oral QHS   Chlorhexidine Gluconate Cloth  6 each Topical Q0600   feeding supplement  237 mL Oral BID BM   fludrocortisone  0.1 mg Oral Daily   gabapentin  300 mg Oral BID   insulin aspart  0-5 Units Subcutaneous QHS   insulin aspart  0-9 Units Subcutaneous TID WC   insulin glargine-yfgn  5 Units Subcutaneous Daily   levETIRAcetam  500 mg Oral BID   midodrine  10 mg Oral TID WC   multivitamin with minerals  1 tablet Oral Daily   mupirocin ointment  1 Application Nasal BID   pantoprazole  40 mg Oral QHS   QUEtiapine  50 mg Oral BID   senna-docusate  1 tablet Oral BID   Vitamin D (Ergocalciferol)  50,000 Units Oral Q7 days   Continuous Infusions: PRN Meds: acetaminophen **OR** acetaminophen (TYLENOL) oral liquid 160 mg/5 mL **OR** acetaminophen, hydrALAZINE, LORazepam, ondansetron (ZOFRAN) IV, traMADol, traZODone, ziprasidone  Time spent: 40 minutes  Author: Gillis Santa. MD [facility] 02/20/2023 2:06 PM  To reach On-call, see care teams to locate the attending and reach out to them via [email or web address]. If 7PM-7AM, please contact night-coverage If you still have difficulty reaching the attending provider, please page the [facility] (Director on Call) for [facility] on amion for assistance.

## 2023-02-20 NOTE — TOC Progression Note (Addendum)
Transition of Care [facility]) - Progression Note    Patient Details  Name: Nicholas Weiss MRN: [id number] Date of Birth: 22-Oct-1950  [facility] of [facility]) CM/SW Contact  Rodney Langton, RN Phone Number: 02/20/2023, 12:02 PM  Clinical Narrative:     SNF recommended for patient.  Called friend [name], no answer, voice message left.   Update @ 1600:  Spoke with [name], agrees to SNF, preference is [facility].  Bed search initiated.   Expected Discharge Plan: Home/Self Care Barriers to Discharge: Continued Medical Work up  Expected Discharge Plan and Services       Living arrangements for the past 2 months: Single Family Home                           HH Arranged: PT HH Agency: [facility] Date HH Agency Contacted: 02/16/23 Time HH Agency Contacted: 1559 Representative spoke with at [facility] Agency: [facility]   Social Determinants of Health (SDOH) Interventions SDOH Screenings   Food Insecurity: No Food Insecurity (02/16/2023)  Housing: Low Risk  (02/16/2023)  Transportation Needs: No Transportation Needs (02/16/2023)  Utilities: Not At Risk (02/16/2023)  Alcohol Screen: Low Risk  (10/02/2020)  Depression (PHQ2-9): Medium Risk (10/02/2020)  Financial Resource Strain: Low Risk  (01/26/2023)   Received from [facility]  Physical Activity: Insufficiently Active (01/01/2020)  Social Connections: Moderately Isolated (01/01/2020)  Stress: No Stress Concern Present (01/01/2020)  Tobacco Use: Low Risk  (02/15/2023)  Recent Concern: Tobacco Use - Medium Risk (02/15/2023)   Received from [facility]    Readmission Risk Interventions    02/16/2023    3:56 PM 08/24/2022   10:57 AM 05/20/2022   11:41 AM  Readmission Risk Prevention Plan  Transportation Screening Complete Complete Complete  Medication Review Oceanographer) Complete Complete Complete  PCP or Specialist appointment within 3-5 days of discharge Complete Complete Complete   HRI or Home Care Consult Complete Complete Complete  SW Recovery Care/Counseling Consult Not Complete    Palliative Care Screening Not Applicable  Complete  Skilled Nursing Facility Complete Complete Complete

## 2023-02-20 NOTE — Progress Notes (Signed)
       CROSS COVER NOTE  NAME: Nicholas Weiss MRN: [id number] DOB : Apr 23, 1950    Concern as stated by nurse / staff   Message received from [facility] RN Pt just had an unwitnessed fall. Don't believe he hit his head. He is being very belligerent and we've had to call security.      Pertinent findings on chart review: 72 yo AA male with paroxysmal a fib (eliquis), type 1 DM, dysautonomia/orthostatic hypertension, dyslipidemia, CVA, multiple myeloma, myoclonus who presented to the ER on 11.18 for recurrent syncope with fall. He was found to have intraventricular hematoma. Eliquis put on hold. Followed by neurosurgery. Repeat scan initially showed no increase in bleed  Assessment and  Interventions   Assessment:    02/20/2023    2:41 AM 02/19/2023    7:52 PM 02/19/2023    3:52 PM  Vitals with BMI  Systolic 130 101 865  Diastolic 85 68 72  Pulse 82 75 75    Patient confused with place time and situation. MAE but not cooperative for skin or head check. No obvious bony deformities  CT head WO contrast - reported increased volume of right lateral intraventricular hemorrhage since 11/19, now moderate. But still the blood is fairly isolated to that ventricle: minimal extension into the 3rd ventricle. No associated ventriculomegaly or transependymal edema   Underlying chronic small vessel disease. NO other acute intracranial abnormality -  Plan:  Neurosurgery Dr Katrinka Blazing still following with patient and made aware of CT findings  Neuro checks every 4h Continue fall precautions 1: 1 observation sitter    Donnie Mesa NP [facility] Cross Cover 7pm-7am - check amion for availability Pager [phone]

## 2023-02-20 NOTE — [place] FL2 (Signed)
[place] MEDICAID FL2 LEVEL OF CARE FORM     IDENTIFICATION  Patient Name: TREVONTE TERRASI Birthdate: 05-Dec-1950 Sex: male Admission Date (Current Location): 02/15/2023  [facility] and [place] Number:  Chiropodist and Address:  [facility], [address], [place], [place] [address]      Provider Number: [id number]  Attending Physician Name and Address:  Gillis Santa, MD  Relative Name and Phone Number:  Flint Melter Clearence Cheek)  [phone]    Current Level of Care: Hospital Recommended Level of Care: Skilled Nursing Facility Prior Approval Number:    Date Approved/Denied:   PASRR Number: [id number]  Discharge Plan: SNF    Current Diagnoses: Patient Active Problem List   Diagnosis Date Noted   ICH (intracerebral hemorrhage) (HCC) 02/16/2023   Uncontrolled diabetes mellitus with hyperglycemia (HCC) 02/16/2023   GERD without esophagitis 02/16/2023   Essential hypertension 02/16/2023   Diabetic neuropathy (HCC) 02/16/2023   Brain bleed (HCC) 02/16/2023   Myoclonus 01/15/2023   Hypothermia 12/29/2022   Dizziness 11/13/2022   Substance abuse (HCC) 09/18/2022   History of GI bleed 09/18/2022   H/O orthostatic hypotension 09/18/2022   Uncontrolled type 1 diabetes mellitus with hyperglycemia, with long-term current use of insulin (HCC) 08/26/2022   Postural dizziness with presyncope 08/18/2022   Near syncope 08/18/2022   Closed fracture of shaft of left humerus 06/18/2022   Malnutrition of moderate degree 06/10/2022   Hypotension 06/10/2022   GI bleed 06/06/2022   Subdural hematoma (HCC) 05/15/2022   Falls 05/15/2022   Shoulder pain, left 05/15/2022   History of amputation of left great toe (HCC) 05/04/2022   Hypophosphatemia 04/28/2022   Protein-calorie malnutrition, severe 04/24/2022   Diabetic ulcer of toe associated with diabetes mellitus due to underlying condition, with fat layer exposed (HCC) 04/24/2022   Streptococcal  bacteremia 04/24/2022   Leukopenia 04/24/2022   History of multiple myeloma 04/24/2022   Sympathotonic orthostatic hypotension 04/24/2022   Weight loss 04/23/2022   Osteomyelitis of left foot (HCC) 04/22/2022   DKA, type 1 (HCC) 04/22/2022   Dehydration 04/08/2022   Diabetic ketoacidosis without coma associated with type 1 diabetes mellitus (HCC) 04/07/2022   Hyperkalemia 04/07/2022   DKA (diabetic ketoacidosis) (HCC) 04/07/2022   Traumatic pneumothorax 02/18/2022   Multiple fractures of ribs, left side, initial encounter for closed fracture 02/18/2022   Pneumothorax, closed, traumatic 02/18/2022   Benign prostatic hyperplasia with nocturia 02/12/2022   Stroke (cerebrum) (HCC) 11/07/2021   Multiple myeloma (HCC) 09/29/2021   Multiple myeloma not having achieved remission (HCC) 09/29/2021   Hyponatremia 08/18/2021   COVID-19 virus infection 08/13/2021   History of CVA (cerebrovascular accident) 08/13/2021   Syncope and collapse, recurrent 08/09/2021   Hepatic lesion 08/05/2021   Dyslipidemia 05/08/2021   History of substance abuse (HCC) 12/09/2020   Acute metabolic encephalopathy 12/02/2020   Cocaine abuse (HCC) 11/30/2020   Symptomatic anemia 11/08/2020   Muscle twitching 09/12/2020   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   Pre-syncope 08/01/2020   Heart block AV first degree 08/01/2020   Anemia of chronic kidney failure, stage 3 (moderate) (HCC) 05/28/2020   PUD (peptic ulcer disease)    Esophageal dysphagia    Encounter for screening colonoscopy    Hyperglycemia 07/28/2019   Generalized weakness 07/28/2019   Hypoglycemia 04/27/2019   Unresponsiveness 04/27/2019   Lung nodule 04/07/2019   Type 1 diabetes mellitus (HCC)    Acute renal failure superimposed on stage 3a chronic kidney disease (HCC)    Hypertensive urgency 04/05/2019  AKI (acute kidney injury) (HCC) 04/05/2019   CKD (chronic kidney disease) stage 3, GFR 30-59 ml/min (HCC) 04/05/2019   Hypercholesterolemia  01/24/2016   Essential (primary) hypertension 06/07/2006   Type 1 diabetes mellitus with diabetic neuropathy, unspecified (HCC) 05/31/2006    Orientation RESPIRATION BLADDER Height & Weight     Self, Time, Place  Normal Incontinent Weight: 62.4 kg Height:  5\' 10"  (177.8 cm)  BEHAVIORAL SYMPTOMS/MOOD NEUROLOGICAL BOWEL NUTRITION STATUS      Continent Diet  AMBULATORY STATUS COMMUNICATION OF NEEDS Skin   Extensive Assist Verbally Normal                       Personal Care Assistance Level of Assistance  Bathing, Dressing Bathing Assistance: Limited assistance         Functional Limitations Info             SPECIAL CARE FACTORS FREQUENCY  PT (By licensed PT), OT (By licensed OT)     PT Frequency: 5 time a week OT Frequency: 5 times a week            Contractures Contractures Info: Not present    Additional Factors Info  Code Status, Allergies Code Status Info: Full Allergies Info: Penicillin           Current Medications (02/20/2023):  This is the current hospital active medication list Current Facility-Administered Medications  Medication Dose Route Frequency Provider Last Rate Last Admin    stroke: early stages of recovery book   Does not apply Once Mansy, Jan A, MD       0.9 %  sodium chloride infusion   Intravenous Continuous Gillis Santa, MD 100 mL/hr at 02/20/23 1453 New Bag at 02/20/23 1453   acetaminophen (TYLENOL) tablet 650 mg  650 mg Oral Q4H PRN Mansy, Jan A, MD   650 mg at 02/19/23 2245   Or   acetaminophen (TYLENOL) 160 MG/5ML solution 650 mg  650 mg Per Tube Q4H PRN Mansy, Jan A, MD       Or   acetaminophen (TYLENOL) suppository 650 mg  650 mg Rectal Q4H PRN Mansy, Jan A, MD       atorvastatin (LIPITOR) tablet 80 mg  80 mg Oral QHS Mansy, Jan A, MD   80 mg at 02/19/23 2156   Chlorhexidine Gluconate Cloth 2 % PADS 6 each  6 each Topical Q0600 Gillis Santa, MD   6 each at 02/20/23 0554   feeding supplement (ENSURE ENLIVE / ENSURE PLUS)  liquid 237 mL  237 mL Oral BID BM Sreenath, Sudheer B, MD   237 mL at 02/20/23 1453   fludrocortisone (FLORINEF) tablet 0.1 mg  0.1 mg Oral Daily Gillis Santa, MD   0.1 mg at 02/19/23 0946   gabapentin (NEURONTIN) capsule 300 mg  300 mg Oral BID Mansy, Jan A, MD   300 mg at 02/19/23 2157   hydrALAZINE (APRESOLINE) tablet 25 mg  25 mg Oral Q8H PRN Gillis Santa, MD       insulin aspart (novoLOG) injection 0-5 Units  0-5 Units Subcutaneous QHS Sreenath, Sudheer B, MD       insulin aspart (novoLOG) injection 0-9 Units  0-9 Units Subcutaneous TID WC Lolita Patella B, MD   2 Units at 02/20/23 1305   insulin glargine-yfgn [facility]) injection 5 Units  5 Units Subcutaneous Daily Mansy, Jan A, MD   5 Units at 02/20/23 1306   levETIRAcetam (KEPPRA) tablet 500 mg  500 mg Oral BID  Gillis Santa, MD   500 mg at 02/19/23 2158   LORazepam (ATIVAN) injection 0.5 mg  0.5 mg Intravenous Q6H PRN Lolita Patella B, MD   0.5 mg at 02/17/23 1943   midodrine (PROAMATINE) tablet 10 mg  10 mg Oral TID WC Gillis Santa, MD   10 mg at 02/19/23 1744   multivitamin with minerals tablet 1 tablet  1 tablet Oral Daily Mansy, Jan A, MD   1 tablet at 02/19/23 0947   mupirocin ointment (BACTROBAN) 2 % 1 Application  1 Application Nasal BID Gillis Santa, MD   1 Application at 02/20/23 1027   ondansetron (ZOFRAN) injection 4 mg  4 mg Intravenous Q4H PRN Mansy, Jan A, MD       pantoprazole (PROTONIX) EC tablet 40 mg  40 mg Oral QHS Tressie Ellis, RPH   40 mg at 02/19/23 2158   QUEtiapine (SEROQUEL) tablet 50 mg  50 mg Oral BID Gillis Santa, MD       senna-docusate (Senokot-S) tablet 1 tablet  1 tablet Oral BID Mansy, Jan A, MD   1 tablet at 02/19/23 2157   traMADol (ULTRAM) tablet 50 mg  50 mg Oral Q6H PRN Mansy, Jan A, MD       traZODone (DESYREL) tablet 25 mg  25 mg Oral QHS PRN Mansy, Jan A, MD   25 mg at 02/18/23 2157   Vitamin D (Ergocalciferol) (DRISDOL) 1.25 MG (50000 UNIT) capsule 50,000 Units  50,000 Units Oral Q7  days Gillis Santa, MD   50,000 Units at 02/18/23 1223   ziprasidone (GEODON) injection 10 mg  10 mg Intramuscular Q6H PRN Lolita Patella B, MD   10 mg at 02/20/23 1610     Discharge Medications: Please see discharge summary for a list of discharge medications.  Relevant Imaging Results:  Relevant Lab Results:   Additional Information SS# [id number]  Rodney Langton, RN

## 2023-02-20 NOTE — Progress Notes (Signed)
Pt activated Bed alarm at Approximately 0245. Several staff including attending RN raced to bedside to assist. Pt found with two hands on floor and two legs in bed, attempting to exit, pulling self slowly to floor. Pt assisted back to bed via 3 staff from a sitting position without incident. Pt examined as per policy. Assessment reveals no change in condition, Pt denies presence of pain/discomfort. Pt agitated on return to bed requiring antipsychotic. 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 Provider Cliffton Asters, NP) contacted as per policy and reports to bedside. Database administrator at bedside, Consulting civil engineer Corrie Dandy) at bedside. Geodon administered and is effective. Follow up assessment and monitoring show Pt at baseline with no deficit or c/o pain/discomfort/disability.

## 2023-02-20 NOTE — Progress Notes (Signed)
Physical Therapy Treatment Patient Details Name: Nicholas Weiss MRN: [id number] DOB: 1950/08/09 Today's Date: 02/20/2023   History of Present Illness Nicholas Weiss is a 72 y.o. African-American male with medical history significant for hypertension, dyslipidemia and type 2 diabetes mellitus, who presented to the emergency room with acute onset of recurrent syncope.  It has been occasionally associated with hypoglycemia.  Few days ago he fell and hit his head yesterday he had a syncopal episode without injuries.  He denied any headache or dizziness or blurred vision.  No paresthesias or focal muscle weakness.  He reported having a R rib fracture from recent fall.    PT Comments  Pt presents lethargic, supine in bed with family present, and agreeable to PT. Pt hypotensive throughout session, but asymptomatic and MAP remained >65 throughout even with positional changes. Pt demonstrates difficulty following one step commands and he often impulsively starts activity without instructions even if this places him in an unsafe situation. He required mod VC and TC to make a turn in hallway without drifting into wall. He maintains a downward gaze during ambulation even with mod VC to stand tall, and it is unclear why he does this. Pt's family that his gait deficits are normal and they attribute to him having bow legs. Pt continues to progress towards goals with improvement in activity tolerance and stable BP to complete activity. He will continue to benefit from skilled PT to decrease risk of falling by improving balance.    If plan is discharge home, recommend the following: A little help with walking and/or transfers;Direct supervision/assist for medications management;Assistance with cooking/housework;Assist for transportation;Assistance with feeding;Help with stairs or ramp for entrance;A little help with bathing/dressing/bathroom   Can travel by private vehicle     Yes  Equipment Recommendations        Recommendations for Other Services       Precautions / Restrictions Precautions Precautions: Fall Restrictions Weight Bearing Restrictions: No     Mobility  Bed Mobility Overal bed mobility: Independent                  Transfers Overall transfer level: Modified independent Equipment used: Rolling walker (2 wheels) Transfers: Sit to/from Stand Sit to Stand: Modified independent (Device/Increase time) (Bed elevated)                Ambulation/Gait Ambulation/Gait assistance: Contact guard assist Gait Distance (Feet): 50 Feet Assistive device: Rolling walker (2 wheels) Gait Pattern/deviations: Ataxic, Drifts right/left Gait velocity: Slow         Stairs             Wheelchair Mobility     Tilt Bed    Modified Rankin (Stroke Patients Only)       Balance Overall balance assessment: Modified Independent Sitting-balance support: Feet supported Sitting balance-Leahy Scale: Normal       Standing balance-Leahy Scale: Good                              Cognition Arousal: Lethargic Behavior During Therapy: Impulsive Overall Cognitive Status: Within Functional Limits for tasks assessed Area of Impairment: Orientation, Following commands, Safety/judgement                 Orientation Level: Time     Following Commands: Follows one step commands inconsistently Safety/Judgement: Decreased awareness of safety              Exercises  General Comments        Pertinent Vitals/Pain Pain Assessment Faces Pain Scale: Hurts a little bit Pain Location: Right abdominal region Pain Descriptors / Indicators: Aching Pain Intervention(s): Monitored during session    Home Living                          Prior Function            PT Goals (current goals can now be found in the care plan section) Acute Rehab PT Goals Patient Stated Goal: To return home safely and without patient being at high risk of  falling PT Goal Formulation: With patient/family Time For Goal Achievement: 03/06/23 Progress towards PT goals: Progressing toward goals    Frequency    Min 3X/week      PT Plan      Co-evaluation     PT goals addressed during session: Mobility/safety with mobility;Balance;Proper use of DME        AM-PAC PT "6 Clicks" Mobility   Outcome Measure  Help needed turning from your back to your side while in a flat bed without using bedrails?: None Help needed moving from lying on your back to sitting on the side of a flat bed without using bedrails?: None Help needed moving to and from a bed to a chair (including a wheelchair)?: A Little Help needed standing up from a chair using your arms (e.g., wheelchair or bedside chair)?: A Little Help needed to walk in hospital room?: A Lot Help needed climbing 3-5 steps with a railing? : A Lot 6 Click Score: 18    End of Session Equipment Utilized During Treatment: Gait belt Activity Tolerance: Patient tolerated treatment well Patient left: in bed;with call bell/phone within reach;with family/visitor present Nurse Communication: Mobility status PT Visit Diagnosis: Unsteadiness on feet (R26.81);Repeated falls (R29.6);Difficulty in walking, not elsewhere classified (R26.2)     Time: 2130-8657 PT Time Calculation (min) (ACUTE ONLY): 25 min  Charges:    $Therapeutic Activity: 23-37 mins PT General Charges $$ ACUTE PT VISIT: 1 Visit                     Ellin Goodie PT, DPT  [facility] [address] [place], [address] Phone: [phone]   Fax:  [phone]

## 2023-02-20 NOTE — Plan of Care (Signed)
  Problem: Intracerebral Hemorrhage Tissue Perfusion: Goal: Complications of Intracerebral Hemorrhage will be minimized Outcome: Progressing   Problem: Self-Care: Goal: Verbalization of feelings and concerns over difficulty with self-care will improve Outcome: Progressing   Problem: Nutrition: Goal: Risk of aspiration will decrease Outcome: Progressing Goal: Dietary intake will improve Outcome: Progressing   Problem: Education: Goal: Knowledge of secondary prevention will improve (MUST DOCUMENT ALL) Outcome: Not Progressing   Problem: Education: Goal: Knowledge of General Education information will improve Description: Including pain rating scale, medication(s)/side effects and non-pharmacologic comfort measures Outcome: Not Progressing

## 2023-02-21 DIAGNOSIS — I619 Nontraumatic intracerebral hemorrhage, unspecified: Secondary | ICD-10-CM | POA: Diagnosis not present

## 2023-02-21 LAB — CBC WITH DIFFERENTIAL/PLATELET
Abs Immature Granulocytes: 0 10*3/uL (ref 0.00–0.07)
Basophils Absolute: 0 10*3/uL (ref 0.0–0.1)
Basophils Relative: 0 %
Eosinophils Absolute: 0 10*3/uL (ref 0.0–0.5)
Eosinophils Relative: 1 %
HCT: 23.2 % — ABNORMAL LOW (ref 39.0–52.0)
Hemoglobin: 7.6 g/dL — ABNORMAL LOW (ref 13.0–17.0)
Immature Granulocytes: 0 %
Lymphocytes Relative: 37 %
Lymphs Abs: 1.1 10*3/uL (ref 0.7–4.0)
MCH: 30.6 pg (ref 26.0–34.0)
MCHC: 32.8 g/dL (ref 30.0–36.0)
MCV: 93.5 fL (ref 80.0–100.0)
Monocytes Absolute: 0.3 10*3/uL (ref 0.1–1.0)
Monocytes Relative: 11 %
Neutro Abs: 1.5 10*3/uL — ABNORMAL LOW (ref 1.7–7.7)
Neutrophils Relative %: 51 %
Platelets: 223 10*3/uL (ref 150–400)
RBC: 2.48 MIL/uL — ABNORMAL LOW (ref 4.22–5.81)
RDW: 14.8 % (ref 11.5–15.5)
WBC: 3 10*3/uL — ABNORMAL LOW (ref 4.0–10.5)
nRBC: 0 % (ref 0.0–0.2)

## 2023-02-21 LAB — GLUCOSE, CAPILLARY
Glucose-Capillary: 279 mg/dL — ABNORMAL HIGH (ref 70–99)
Glucose-Capillary: 155 mg/dL — ABNORMAL HIGH (ref 70–99)
Glucose-Capillary: 77 mg/dL (ref 70–99)
Glucose-Capillary: 161 mg/dL — ABNORMAL HIGH (ref 70–99)

## 2023-02-21 LAB — BASIC METABOLIC PANEL
Anion gap: 7 (ref 5–15)
BUN: 42 mg/dL — ABNORMAL HIGH (ref 8–23)
CO2: 25 mmol/L (ref 22–32)
Calcium: 8 mg/dL — ABNORMAL LOW (ref 8.9–10.3)
Chloride: 107 mmol/L (ref 98–111)
Creatinine, Ser: 1.76 mg/dL — ABNORMAL HIGH (ref 0.61–1.24)
GFR, Estimated: 41 mL/min — ABNORMAL LOW (ref >60)
Glucose, Bld: 249 mg/dL — ABNORMAL HIGH (ref 70–99)
Potassium: 4.9 mmol/L (ref 3.5–5.1)
Sodium: 139 mmol/L (ref 135–145)

## 2023-02-21 MED ORDER — SODIUM CHLORIDE 0.9 % IV SOLN: INTRAVENOUS | Status: AC

## 2023-02-21 NOTE — Progress Notes (Addendum)
[facility] Progress Note  Patient: Nicholas Weiss    JXB:[id number]  DOA: 02/15/2023     Date of Service: the patient was seen and examined on 02/21/2023  Chief Complaint  Patient presents with   Loss of Consciousness   Brief hospital course: Nicholas Weiss is a 72 y.o. African-American male with PMH of paroxysmal A-fib, type 1 diabetes mellitus, dysautonomia/orthostatic hypotension, hypertension, dyslipidemia, CVA, multiple myeloma, myoclonus/asterixis was seen by Neuro and started keppra,  , who presented to the emergency room with acute onset of recurrent syncope.  It has been occasionally associated with hypoglycemia.  Few days ago he fell and hit his head yesterday he had a syncopal episode without injuries.  He denied any headache or dizziness or blurred vision.  No paresthesias or focal muscle weakness.  He reported having a rib fracture from recent fall.  He has been having right-sided chest/rib pain without palpitations.  No cough or wheezing or dyspnea.  No dysuria, oliguria or hematuria or flank pain.  No bleeding diathesis.   ED Course: When he came to the ER, BP was 171/99 with otherwise normal vital signs.  Labs revealed hyponatremia of 129 and hyperglycemia of 413 with a BUN of 39 creatinine 2.17 comparable to previous levels and high sensitive troponin I was 18.  CBC showed mild leukopenia of 3.2 and anemia close to baseline.   EKG as reviewed by me : Normal sinus rhythm with rate of 81 without acute findings.  QTc was 548 MS. Imaging: On 02/12/2023: Rib x-ray showed minimally displaced fracture of the posterolateral right 10th rib with no acute cardiopulmonary disease.   Noncontrasted CT scan and C-spine CT: 1. Small amount of hyperdense hemorrhage in the right frontal horn, which is new from the 02/12/2023 exam, of indeterminate etiology. No hydrocephalus. 2. No acute fracture or traumatic listhesis in the cervical spine.   - Head  and neck CTA:  1. No evidence of  active extravasation in the right frontal horn hemorrhage. 2. No intracranial large vessel occlusion or significant stenosis.  3. 50% stenosis in the proximal bilateral ICAs, just distal to the bifurcations. 4. Severe stenosis at the origin of the right vertebral artery. 5. Multiple dural based enhancing lesions, the largest of which measures up to 9 mm, which correlate with calcified lesions on the prior head CT, most likely small meningiomas, without significant mass effect. 6. Aortic atherosclerosis.   6 hours later repeat head CT scan without contrast showed stable hemorrhage in the anterior horn of the right lateral ventricle.   The patient was given 5 mg oxycodone, 5 units of subcutaneous NovoLog and 1 L bolus of IV normal saline.  Dr. Katrinka Blazing with neurosurgery was notified about the patient.  admitted to a stepdown unit bed for further evaluation and management.   Assessment and Plan:   # Intraventricular hemorrhage, s/p fall/syncope due to orthostatic hypotension Neurosurgery consulted, recommended conservative management Repeat CT head stable. As per neurosurgery no more scan needed, it should be self-limited and no follow-up needed with neurosurgery.  If situation change then we can repeat CT head and he may follow-up with neurosurgery Hold Eliquis for 2 weeks, if there is a strong indication for him to be on it.  11/23 patient had a fall last night and CT head was done which showed worsening of intraventricular bleeding but it was reviewed by neurosurgeon who recommended no change in follow-up as an outpatient for repeat CT scan, no further intervention. Continue fall precautions   #  Orthostatic hypotension/dysautonomia Started midodrine 10 mg p.o. 3 times daily with holding parameters Started Florinef 0.1 mg p.o. daily with holding parameters Monitor BP and titrate medications accordingly  Paroxysmal A-fib Patient was seen by cardiology in the past, aspirin was discontinued and  patient was started on Eliquis 5 mg p.o. twice daily  # Hypertension Avoid low BP due to orthostatics Use hydralazine 25 mg p.o. TID prn if SBP > 160 mmHg Monitor BP and titrate medications accordingly   Uncontrolled diabetes mellitus with hyperglycemia Type 1 diabetes mellitus Continue Semglee 5 units daily, NovoLog sliding scale, monitor CBG Diabetic coordinator consulted Continue diabetic diet  CKD stage IIIb Baseline creatinine 1.7 Cr 2.16--1.9--2.0--1.76 Continue oral hydration and monitor urine output Avoid nephrotoxic medication, use renally dose medications. 11/23 started NS 100 mL/h for 10 hours to hydrate and recheck renal functions tomorrow US renal: Prevoid volume 337 ml. Postvoid volume 102 ml. No hydronephrosis.   Hyponatremia, Resolved  pseudohyponatremia due to hyperglycemia. monitor sodium level daily    Diabetic neuropathy  - continue Neurontin.   GERD without esophagitis - continue PPI therapy.   Symptomatic myoclonus and asterixis, involuntary movement Keppra 500mg  BID for symptomatic myoclonus was started by Neurologist on 01/15/2023  H/o CVA, left subcortical infarct, continue Statin ASA was stopped and pt was started on Eliquis, due to paroxysmal A-fib H/o Multiple myeloma, s/p  chemo, recommended to continue to follow with oncologist as an outpatient   Anemia of chronic disease Iron, folate and B12 within normal range Monitor H&H Transfuse if hemoglobin less than 7   Vitamin D Insufficiency: started vitamin D 50,000 units p.o. weekly, follow with PCP to repeat vitamin D level after 3 to 6 months.   Body mass index is 19.74 kg/m.  Interventions:  Diet: Carb modified diet DVT Prophylaxis: SCD, pharmacological prophylaxis contraindicated due to ICH    Advance goals of care discussion: Full code  Family Communication: family was not present at bedside, at the time of interview.  The pt provided permission to discuss medical plan with the  family. Opportunity was given to ask question and all questions were answered satisfactorily.  11/21 discussed with patient's friend over the phone 11/23 discussed with patient's friend over the phone   Disposition:  Pt is from Home, admitted with intraventricular hemorrhage, orthostatic hypotension, syncope, still has orthostatic vitals, which is chronic issue, otherwise patient is clinically stable, medically optimized to discharge to SNF  Discharge to SNF, when bed will be available.  TOC is following for disposition plan.    Subjective: No significant overnight events, patient was sleeping, he woke up by calling his name.  Denied any complaints, As per sitter in the room patient had headache which resolved after taking Tylenol.  Patient will be seen by PT and OT.  No any other complaints.   Physical Exam: General: NAD, lying comfortably Appear in no distress, affect appropriate Eyes: PERRLA ENT: Oral Mucosa Clear, moist  Neck: no JVD,  Cardiovascular: S1 and S2 Present, no Murmur,  Respiratory: good respiratory effort, Bilateral Air entry equal and Decreased, no Crackles, no wheezes Abdomen: Bowel Sound present, Soft and no tenderness,  Skin: no rashes Extremities: no Pedal edema, no calf tenderness, s/p left first and second toe amputation Neurologic: without any new focal findings Gait not checked due to patient safety concerns  Vitals:   02/20/23 1952 02/20/23 2228 02/21/23 0438 02/21/23 0814  BP: 96/69 (!) 146/97 112/72 134/80  Pulse: 93 80 82 82  Resp:   16  18  Temp:   98.3 F (36.8 C) 98.5 F (36.9 C)  TempSrc:   Oral Oral  SpO2: 100%  100% 100%  Weight:      Height:        Intake/Output Summary (Last 24 hours) at 02/21/2023 1148 Last data filed at 02/21/2023 0543 Gross per 24 hour  Intake --  Output 600 ml  Net -600 ml   Filed Weights   02/15/23 1713 02/16/23 0545 02/17/23 0044  Weight: 64 kg 65 kg 62.4 kg    Data Reviewed: I have personally reviewed  and interpreted daily labs, tele strips, imagings as discussed above. I reviewed all nursing notes, pharmacy notes, vitals, pertinent old records I have discussed plan of care as described above with RN and patient/family.  CBC: Recent Labs  Lab 02/16/23 0827 02/18/23 0448 02/19/23 0524 02/20/23 0414 02/21/23 0420  WBC 3.1* 3.2* 2.6* 2.7* 3.0*  NEUTROABS 1.6* 1.5* 1.2* 1.5* 1.5*  HGB 9.6* 8.5* 8.8* 8.6* 7.6*  HCT 29.7* 25.8* 26.7* 25.7* 23.2*  MCV 93.7 90.8 92.4 91.5 93.5  PLT 225 244 206 241 223   Basic Metabolic Panel: Recent Labs  Lab 02/15/23 1826 02/16/23 0827 02/18/23 0448 02/19/23 0524 02/20/23 0414 02/21/23 0420  NA 129* 135 134* 136 135 139  K 5.1 4.7 4.6 4.1 4.4 4.9  CL 99 103 104 106 104 107  CO2 22 24 21* 22 24 25   GLUCOSE 413* 226* 158* 81 165* 249*  BUN 39* 35* 51* 46* 42* 42*  CREATININE 2.17* 1.72* 2.16* 1.90* 2.00* 1.76*  CALCIUM 8.6* 8.6* 8.5* 8.6* 8.4* 8.0*  MG 2.2  --  2.3 2.3 2.2  --   PHOS  --   --  3.6 4.1 3.5  --     Studies: No results found.  Scheduled Meds:   stroke: early stages of recovery book   Does not apply Once   atorvastatin  80 mg Oral QHS   Chlorhexidine Gluconate Cloth  6 each Topical Q0600   feeding supplement  237 mL Oral BID BM   fludrocortisone  0.1 mg Oral Daily   gabapentin  300 mg Oral BID   insulin aspart  0-5 Units Subcutaneous QHS   insulin aspart  0-9 Units Subcutaneous TID WC   insulin glargine-yfgn  5 Units Subcutaneous Daily   levETIRAcetam  500 mg Oral BID   midodrine  10 mg Oral TID WC   multivitamin with minerals  1 tablet Oral Daily   mupirocin ointment  1 Application Nasal BID   pantoprazole  40 mg Oral QHS   QUEtiapine  50 mg Oral BID   senna-docusate  1 tablet Oral BID   Vitamin D (Ergocalciferol)  50,000 Units Oral Q7 days   Continuous Infusions:  sodium chloride 75 mL/hr at 02/21/23 0854   PRN Meds: acetaminophen **OR** acetaminophen (TYLENOL) oral liquid 160 mg/5 mL **OR** acetaminophen,  hydrALAZINE, LORazepam, ondansetron (ZOFRAN) IV, traMADol, traZODone, ziprasidone  Time spent: 40 minutes  Author: Gillis Santa. MD [facility] 02/21/2023 11:48 AM  To reach On-call, see care teams to locate the attending and reach out to them via [email or web address]. If 7PM-7AM, please contact night-coverage If you still have difficulty reaching the attending provider, please page the [facility] (Director on Call) for [facility] on amion for assistance.

## 2023-02-21 NOTE — Plan of Care (Signed)
  Problem: Education: Goal: Knowledge of disease or condition will improve Outcome: Not Progressing Goal: Knowledge of secondary prevention will improve (MUST DOCUMENT ALL) Outcome: Not Progressing Goal: Knowledge of patient specific risk factors will improve Loraine Leriche N/A or DELETE if not current risk factor) Outcome: Not Progressing   Problem: Intracerebral Hemorrhage Tissue Perfusion: Goal: Complications of Intracerebral Hemorrhage will be minimized Outcome: Not Progressing   Problem: Coping: Goal: Will verbalize positive feelings about self Outcome: Not Progressing Goal: Will identify appropriate support needs Outcome: Not Progressing   Problem: Health Behavior/Discharge Planning: Goal: Ability to manage health-related needs will improve Outcome: Not Progressing Goal: Goals will be collaboratively established with patient/family Outcome: Not Progressing   Problem: Self-Care: Goal: Ability to participate in self-care as condition permits will improve Outcome: Not Progressing Goal: Verbalization of feelings and concerns over difficulty with self-care will improve Outcome: Not Progressing Goal: Ability to communicate needs accurately will improve Outcome: Not Progressing   Problem: Nutrition: Goal: Risk of aspiration will decrease Outcome: Not Progressing Goal: Dietary intake will improve Outcome: Not Progressing   Problem: Education: Goal: Knowledge of General Education information will improve Description: Including pain rating scale, medication(s)/side effects and non-pharmacologic comfort measures Outcome: Not Progressing   Problem: Health Behavior/Discharge Planning: Goal: Ability to manage health-related needs will improve Outcome: Not Progressing   Problem: Clinical Measurements: Goal: Ability to maintain clinical measurements within normal limits will improve Outcome: Not Progressing Goal: Will remain free from infection Outcome: Not Progressing Goal:  Diagnostic test results will improve Outcome: Not Progressing Goal: Respiratory complications will improve Outcome: Not Progressing Goal: Cardiovascular complication will be avoided Outcome: Not Progressing   Problem: Activity: Goal: Risk for activity intolerance will decrease Outcome: Not Progressing   Problem: Nutrition: Goal: Adequate nutrition will be maintained Outcome: Not Progressing   Problem: Coping: Goal: Level of anxiety will decrease Outcome: Not Progressing   Problem: Elimination: Goal: Will not experience complications related to bowel motility Outcome: Not Progressing Goal: Will not experience complications related to urinary retention Outcome: Not Progressing   Problem: Pain Management: Goal: General experience of comfort will improve Outcome: Not Progressing   Problem: Safety: Goal: Ability to remain free from injury will improve Outcome: Not Progressing   Problem: Skin Integrity: Goal: Risk for impaired skin integrity will decrease Outcome: Not Progressing   Problem: Education: Goal: Ability to describe self-care measures that may prevent or decrease complications (Diabetes Survival Skills Education) will improve Outcome: Not Progressing Goal: Individualized Educational Video(s) Outcome: Not Progressing   Problem: Coping: Goal: Ability to adjust to condition or change in health will improve Outcome: Not Progressing   Problem: Fluid Volume: Goal: Ability to maintain a balanced intake and output will improve Outcome: Not Progressing   Problem: Health Behavior/Discharge Planning: Goal: Ability to identify and utilize available resources and services will improve Outcome: Not Progressing Goal: Ability to manage health-related needs will improve Outcome: Not Progressing   Problem: Metabolic: Goal: Ability to maintain appropriate glucose levels will improve Outcome: Not Progressing   Problem: Nutritional: Goal: Maintenance of adequate  nutrition will improve Outcome: Not Progressing Goal: Progress toward achieving an optimal weight will improve Outcome: Not Progressing   Problem: Skin Integrity: Goal: Risk for impaired skin integrity will decrease Outcome: Not Progressing   Problem: Tissue Perfusion: Goal: Adequacy of tissue perfusion will improve Outcome: Not Progressing

## 2023-02-21 NOTE — Progress Notes (Signed)
Physical Therapy Treatment Patient Details Name: Nicholas Weiss MRN: [id number] DOB: February 19, 1951 Today's Date: 02/21/2023   History of Present Illness Nicholas Weiss is a 72 y.o. African-American male with medical history significant for hypertension, dyslipidemia and type 2 diabetes mellitus, who presented to the emergency room with acute onset of recurrent syncope.  It has been occasionally associated with hypoglycemia.  Few days ago he fell and hit his head yesterday he had a syncopal episode without injuries.  He denied any headache or dizziness or blurred vision.  No paresthesias or focal muscle weakness.  He reported having a R rib fracture from recent fall.    PT Comments  Pt generally lethargic this AM.  Sitter in stating he has been sleeping most of the morning but I did see pt awake eating breakfast earlier.  He is given a wet wash cloth to awaken and does seem to become more awake and talkative.  BP in supine 95/62 P 73.  Transitioned to sitting with min a x 1 mostly for encouragement to initiate movement.  Sitting BP 80/61 P 82.  Pt does have increased right lean and seemed to be more sleepy/less responsive.  Assisted pt back to supine to supine with mod a x 1.  BP initially 70/56 P 81 with HO raised.  Lowered HOB and BP increased to baseline 92/62 P 76.  Discussed with RN.  Sitter in room.   If plan is discharge home, recommend the following: A little help with walking and/or transfers;Direct supervision/assist for medications management;Assistance with cooking/housework;Assist for transportation;Assistance with feeding;Help with stairs or ramp for entrance;A little help with bathing/dressing/bathroom   Can travel by private vehicle     Yes  Equipment Recommendations       Recommendations for Other Services       Precautions / Restrictions Precautions Precautions: Fall Other Brace: Abdominal binder, B TED hose Restrictions Weight Bearing Restrictions: No     Mobility   Bed Mobility Overal bed mobility: Needs Assistance Bed Mobility: Supine to Sit, Sit to Supine     Supine to sit: Min assist Sit to supine: Mod assist        Transfers                        Ambulation/Gait                   Stairs             Wheelchair Mobility     Tilt Bed    Modified Rankin (Stroke Patients Only)       Balance Overall balance assessment: Needs assistance Sitting-balance support: Feet supported Sitting balance-Leahy Scale: Poor Sitting balance - Comments: due to syncope, poor sitting balance                                    Cognition Arousal: Lethargic Behavior During Therapy: Flat affect Overall Cognitive Status: Difficult to assess                                          Exercises      General Comments        Pertinent Vitals/Pain Pain Assessment Pain Assessment: No/denies pain    Home Living  Prior Function            PT Goals (current goals can now be found in the care plan section) Progress towards PT goals: Not progressing toward goals - comment    Frequency    Min 3X/week      PT Plan      Co-evaluation              AM-PAC PT "6 Clicks" Mobility   Outcome Measure  Help needed turning from your back to your side while in a flat bed without using bedrails?: None Help needed moving from lying on your back to sitting on the side of a flat bed without using bedrails?: A Little Help needed moving to and from a bed to a chair (including a wheelchair)?: A Lot Help needed standing up from a chair using your arms (e.g., wheelchair or bedside chair)?: A Lot Help needed to walk in hospital room?: A Lot Help needed climbing 3-5 steps with a railing? : A Lot 6 Click Score: 15    End of Session Equipment Utilized During Treatment: Gait belt Activity Tolerance: Patient tolerated treatment well Patient left: in bed;with  call bell/phone within reach;with nursing/sitter in room Nurse Communication: Mobility status PT Visit Diagnosis: Unsteadiness on feet (R26.81);Repeated falls (R29.6);Difficulty in walking, not elsewhere classified (R26.2)     Time: 5784-6962 PT Time Calculation (min) (ACUTE ONLY): 19 min  Charges:    $Therapeutic Activity: 8-22 mins PT General Charges $$ ACUTE PT VISIT: 1 Visit                   Danielle Dess, PTA 02/21/23, 1:25 PM

## 2023-02-22 DIAGNOSIS — I615 Nontraumatic intracerebral hemorrhage, intraventricular: Secondary | ICD-10-CM | POA: Diagnosis not present

## 2023-02-22 LAB — CBC WITH DIFFERENTIAL/PLATELET
Abs Immature Granulocytes: 0 10*3/uL (ref 0.00–0.07)
Basophils Absolute: 0 10*3/uL (ref 0.0–0.1)
Basophils Relative: 0 %
Eosinophils Absolute: 0.1 10*3/uL (ref 0.0–0.5)
Eosinophils Relative: 3 %
HCT: 24.9 % — ABNORMAL LOW (ref 39.0–52.0)
Hemoglobin: 8.1 g/dL — ABNORMAL LOW (ref 13.0–17.0)
Immature Granulocytes: 0 %
Lymphocytes Relative: 41 %
Lymphs Abs: 1.3 10*3/uL (ref 0.7–4.0)
MCH: 30.7 pg (ref 26.0–34.0)
MCHC: 32.5 g/dL (ref 30.0–36.0)
MCV: 94.3 fL (ref 80.0–100.0)
Monocytes Absolute: 0.3 10*3/uL (ref 0.1–1.0)
Monocytes Relative: 11 %
Neutro Abs: 1.4 10*3/uL — ABNORMAL LOW (ref 1.7–7.7)
Neutrophils Relative %: 45 %
Platelets: 217 10*3/uL (ref 150–400)
RBC: 2.64 MIL/uL — ABNORMAL LOW (ref 4.22–5.81)
RDW: 14.9 % (ref 11.5–15.5)
WBC: 3.1 10*3/uL — ABNORMAL LOW (ref 4.0–10.5)
nRBC: 0 % (ref 0.0–0.2)

## 2023-02-22 LAB — GLUCOSE, CAPILLARY
Glucose-Capillary: 104 mg/dL — ABNORMAL HIGH (ref 70–99)
Glucose-Capillary: 233 mg/dL — ABNORMAL HIGH (ref 70–99)
Glucose-Capillary: 217 mg/dL — ABNORMAL HIGH (ref 70–99)
Glucose-Capillary: 153 mg/dL — ABNORMAL HIGH (ref 70–99)

## 2023-02-22 LAB — PHOSPHORUS: Phosphorus: 2.9 mg/dL (ref 2.5–4.6)

## 2023-02-22 LAB — BASIC METABOLIC PANEL
Anion gap: 6 (ref 5–15)
BUN: 36 mg/dL — ABNORMAL HIGH (ref 8–23)
CO2: 24 mmol/L (ref 22–32)
Calcium: 8.2 mg/dL — ABNORMAL LOW (ref 8.9–10.3)
Chloride: 109 mmol/L (ref 98–111)
Creatinine, Ser: 1.68 mg/dL — ABNORMAL HIGH (ref 0.61–1.24)
GFR, Estimated: 44 mL/min — ABNORMAL LOW (ref >60)
Glucose, Bld: 99 mg/dL (ref 70–99)
Potassium: 4.2 mmol/L (ref 3.5–5.1)
Sodium: 139 mmol/L (ref 135–145)

## 2023-02-22 LAB — MAGNESIUM: Magnesium: 1.8 mg/dL (ref 1.7–2.4)

## 2023-02-22 MED ORDER — GLUCERNA SHAKE PO LIQD
237.0000 mL | Freq: Three times a day (TID) | ORAL | Status: DC
  Administered 2023-02-22 – 2023-02-24 (×2): 237 mL via ORAL

## 2023-02-22 MED ORDER — LOPERAMIDE HCL 2 MG PO CAPS
4.0000 mg | ORAL_CAPSULE | Freq: Three times a day (TID) | ORAL | Status: DC | PRN
  Administered 2023-02-22: 4 mg via ORAL

## 2023-02-22 NOTE — TOC Progression Note (Signed)
Transition of Care [facility]) - Progression Note    Patient Details  Name: NIKALUS LASKE MRN: [id number] Date of Birth: 09/07/1950  [facility] CM/SW Contact  Allena Katz, LCSW Phone Number: 02/22/2023, 12:03 PM  Clinical Narrative:   Berkley Harvey started for [facility]. Tanya notified.     Expected Discharge Plan: Home/Self Care Barriers to Discharge: Continued Medical Work up  Expected Discharge Plan and Services       Living arrangements for the past 2 months: Single Family Home                           HH Arranged: PT HH Agency: [facility] Date HH Agency Contacted: 02/16/23 Time HH Agency Contacted: 1559 Representative spoke with at [facility] Agency: [facility]   Social Determinants of Health (SDOH) Interventions SDOH Screenings   Food Insecurity: No Food Insecurity (02/16/2023)  Housing: Low Risk  (02/16/2023)  Transportation Needs: No Transportation Needs (02/16/2023)  Utilities: Not At Risk (02/16/2023)  Alcohol Screen: Low Risk  (10/02/2020)  Depression (PHQ2-9): Medium Risk (10/02/2020)  Financial Resource Strain: Low Risk  (01/26/2023)   Received from [facility]  Physical Activity: Insufficiently Active (01/01/2020)  Social Connections: Moderately Isolated (01/01/2020)  Stress: No Stress Concern Present (01/01/2020)  Tobacco Use: Low Risk  (02/15/2023)  Recent Concern: Tobacco Use - Medium Risk (02/15/2023)   Received from [facility]    Readmission Risk Interventions    02/16/2023    3:56 PM 08/24/2022   10:57 AM 05/20/2022   11:41 AM  Readmission Risk Prevention Plan  Transportation Screening Complete Complete Complete  Medication Review Oceanographer) Complete Complete Complete  PCP or Specialist appointment within 3-5 days of discharge Complete Complete Complete  HRI or Home Care Consult Complete Complete Complete  SW Recovery Care/Counseling Consult Not Complete    Palliative Care Screening Not Applicable   Complete  Skilled Nursing Facility Complete Complete Complete

## 2023-02-22 NOTE — Progress Notes (Signed)
Occupational Therapy Treatment Patient Details Name: Nicholas Weiss MRN: [id number] DOB: [birth year] Today's Date: 02/22/2023   History of present illness Nicholas Weiss is a 72 y.o. African-American male with medical history significant for hypertension, dyslipidemia and type 2 diabetes mellitus, who presented to the emergency room with acute onset of recurrent syncope.  It has been occasionally associated with hypoglycemia.  Few days ago he fell and hit his head yesterday he had a syncopal episode without injuries.  He denied any headache or dizziness or blurred vision.  No paresthesias or focal muscle weakness.  He reported having a R rib fracture from recent fall.   OT comments  [name] was seen for OT/PT co-treatment on this date. Upon arrival to room pt supine in bed, agreeable to Tx session with encouragement. OT facilitated ADL management with education and assist as described below. See ADL section for additional details regarding occupational performance. Pt continues to be functionally limited by orthostatic BP, lethargy, decreased activity tolerance, and decreased safety awareness. Pt return verbalizes understanding of education provided t/o session. Pt is progressing toward OT goals and continues to benefit from skilled OT services to maximize return to PLOF and minimize risk of future falls, injury, caregiver burden, and readmission. Will continue to follow POC as written. Discharge recommendation remains appropriate.        If plan is discharge home, recommend the following:  A lot of help with walking and/or transfers;A lot of help with bathing/dressing/bathroom;Assistance with cooking/housework;Direct supervision/assist for financial management;Assist for transportation;Direct supervision/assist for medications management;Help with stairs or ramp for entrance   Equipment Recommendations   (Defer)    Recommendations for Other Services      Precautions / Restrictions  Precautions Precautions: Fall Required Braces or Orthoses: Other Brace Other Brace: Abdominal binder, B TED hose Restrictions Weight Bearing Restrictions: No       Mobility Bed Mobility Overal bed mobility: Needs Assistance Bed Mobility: Supine to Sit, Sit to Supine     Supine to sit: Contact guard Sit to supine: Contact guard assist   General bed mobility comments: Prior to mobility, ted hose and abdominal binder donned. Patient requires max encouragement to participate, able to complete log roll and supine to sit with CGA.  Flat affect. Barely opens eyes throughout session. Forward flexed seated at EOB despite multimodal cues. Denies lightheadedness sensation despite BP readings. Pt BP supine: 109/68, Seated (1 Min): 77/44, Seated (3 Min): 73/53. Patient very lethargic and minimal participation despite encouragement from therapist.    Transfers                   General transfer comment: deferred due to lethargy this date, pt able to lateral scoot toward [facility] with supervision for safety.     Balance Overall balance assessment: Needs assistance Sitting-balance support: Feet supported Sitting balance-Leahy Scale: Fair Sitting balance - Comments: one episode of forward lean but able to correct with steadying assist and multimodal cues. Postural control: Posterior lean, Left lateral lean Standing balance support: No upper extremity supported Standing balance-Leahy Scale: Good Standing balance comment: posterior leaning in standing, requiring assist to maintain balance to take BP                           ADL either performed or assessed with clinical judgement   ADL Overall ADL's : Needs assistance/impaired Eating/Feeding: Set up;Supervision/ safety;Sitting;Cueing for sequencing Eating/Feeding Details (indicate cue type and reason): Cueing to initiate self  feeding/drinking but no physical assist required while seated EOB or once returned to Fowler's position in  bed for self-feeding             Upper Body Dressing : Maximal assistance;Sitting Upper Body Dressing Details (indicate cue type and reason): MAX A to don abdominal binder while seated EOB. Pt very lethargic at start of session, requires consistent cueing to actively participate in session. Improves as session progresses. Lower Body Dressing: Bed level;Maximal assistance Lower Body Dressing Details (indicate cue type and reason): MAX A to don bilat compression stockings and socks.               General ADL Comments: Functional mobility limted 2/2 BP and lethargy, pt is able to scoot toward [facility] x4 with supervision for safety. MAX multimodal cueing for participation in session/functional activity.    Extremity/Trunk Assessment              Vision Baseline Vision/History: 1 Wears glasses Patient Visual Report: No change from baseline     Perception     Praxis      Cognition Arousal: Lethargic Behavior During Therapy: Flat affect Overall Cognitive Status: Difficult to assess                                 General Comments: Poor insight and safety awareness, requires consistent multimodal cueing for alertness/to participate in session. Improves as session progresses.        Exercises Other Exercises Other Exercises: Pt educated on safety, falls prevention strategies, strategies for donning/doffing compression stockings, and importance of functional activity during hospital stay.    Shoulder Instructions       General Comments      Pertinent Vitals/ Pain       Pain Assessment Pain Assessment: No/denies pain Pain Intervention(s): Monitored during session  Home Living                                          Prior Functioning/Environment              Frequency  Min 1X/week        Progress Toward Goals  OT Goals(current goals can now be found in the care plan section)  Progress towards OT goals: Progressing toward  goals  Acute Rehab OT Goals Patient Stated Goal: To return to PLOF OT Goal Formulation: With patient Time For Goal Achievement: 03/03/23 Potential to Achieve Goals: Good  Plan      Co-evaluation    PT/OT/SLP Co-Evaluation/Treatment: Yes Reason for Co-Treatment: For patient/therapist safety;To address functional/ADL transfers PT goals addressed during session: Mobility/safety with mobility;Balance;Proper use of DME OT goals addressed during session: ADL's and self-care;Proper use of Adaptive equipment and DME      AM-PAC OT "6 Clicks" Daily Activity     Outcome Measure   Help from another person eating meals?: A Little Help from another person taking care of personal grooming?: A Little Help from another person toileting, which includes using toliet, bedpan, or urinal?: A Lot Help from another person bathing (including washing, rinsing, drying)?: A Lot Help from another person to put on and taking off regular upper body clothing?: A Little Help from another person to put on and taking off regular lower body clothing?: A Lot 6 Click Score: 15    End of Session  OT Visit Diagnosis: Other abnormalities of gait and mobility (R26.89);Repeated falls (R29.6)   Activity Tolerance Treatment limited secondary to medical complications (Comment) (orthostatic BP)   Patient Left in bed;with call bell/phone within reach;with bed alarm set;with nursing/sitter in room   Nurse Communication Mobility status        Time: 5284-1324 OT Time Calculation (min): 24 min  Charges: OT General Charges $OT Visit: 1 Visit OT Treatments $Self Care/Home Management : 8-22 mins  Rockney Ghee, M.S., OTR/L 02/22/23, 3:46 PM

## 2023-02-22 NOTE — Progress Notes (Signed)
Initial Nutrition Assessment  DOCUMENTATION CODES:   Severe malnutrition in context of chronic illness  INTERVENTION:   -Glucerna Shake po TID, each supplement provides 220 kcal and 10 grams of protein  -MVI with minerals daily -Magic cup TID with meals, each supplement provides 290 kcal and 9 grams of protein   NUTRITION DIAGNOSIS:   Severe Malnutrition related to chronic illness (multiple myeloma) as evidenced by mild fat depletion, moderate fat depletion, moderate muscle depletion, severe muscle depletion, percent weight loss.  GOAL:   Patient will meet greater than or equal to 90% of their needs  MONITOR:   PO intake, Supplement acceptance  REASON FOR ASSESSMENT:   Malnutrition Screening Tool    ASSESSMENT:   Pt with PMH of paroxysmal A-fib, type 1 diabetes mellitus, dysautonomia/orthostatic hypotension, hypertension, dyslipidemia, CVA, multiple myeloma, myoclonus/asterixis who presented with acute onset of recurrent syncope.  Pt admitted with intraventricular hemorrhage s/p fall and syncope secondary to orthostatic hypotension.   Reviewed I/O's: +790 ml x 24 hours and +345 ml since admission  UOP: 1.1 L x 24 hours  Pt lying in bed, with blankets covering entire body. Pt woke up a little bit during exam, but overall not very talkative to RD. Npo family at bedside to provide further information.   Pt currently on a carb modified diet. Noted meal completions 50-80%. Pt consumed only a few bites of meatloaf and an Ensure today. Per pt, he usually eats most of his food and drinks his supplements, but is not feeling well today.   Reviewed wt hx; pt has experienced a 12.4% wt loss over the past 3 months, which is significant for time frame.   Medications reviewed and include neurontin, keppra, protonix, senokot, and vitamin D.   Per TOC notes, plan for SNF placement once medically stable.   Lab Results  Component Value Date   HGBA1C 9.0 (H) 11/14/2022   PTA DM  medications are SSI insulin aspart and 5 units insulin degludec daily.   Labs reviewed: CBGS: 77-279 (inpatient orders for glycemic control are 0-9 units insulin aspart daily at bedtime, 0-9 units insulin aspart TID with meals, and 5 units insulin glargine-yfgn daily).    NUTRITION - FOCUSED PHYSICAL EXAM:  Flowsheet Row Most Recent Value  Orbital Region Moderate depletion  Upper Arm Region Mild depletion  Thoracic and Lumbar Region Moderate depletion  Buccal Region Moderate depletion  Temple Region Severe depletion  Clavicle Bone Region Moderate depletion  Clavicle and Acromion Bone Region Moderate depletion  Scapular Bone Region Moderate depletion  Dorsal Hand Moderate depletion  Patellar Region Severe depletion  Anterior Thigh Region Severe depletion  Posterior Calf Region Severe depletion  Edema (RD Assessment) None  Hair Reviewed  Eyes Reviewed  Mouth Reviewed  Skin Reviewed  Nails Reviewed       Diet Order:   Diet Order             Diet Carb Modified Fluid consistency: Thin; Room service appropriate? Yes  Diet effective now                   EDUCATION NEEDS:   Education needs have been addressed  Skin:  Skin Assessment: Reviewed RN Assessment  Last BM:  02/22/23 (type 7)  Height:   Ht Readings from Last 1 Encounters:  02/17/23 5\' 10"  (1.778 m)    Weight:   Wt Readings from Last 1 Encounters:  02/17/23 62.4 kg    Ideal Body Weight:  75.5 kg  BMI:  Body mass index is 19.74 kg/m.  Estimated Nutritional Needs:   Kcal:  1900-2100  Protein:  90-105 grams  Fluid:  > 1.9 L    Levada Schilling, RD, LDN, CDCES Registered Dietitian III Certified Diabetes Care and Education Specialist Please refer to [facility] for RD and/or RD on-call/weekend/after hours pager

## 2023-02-22 NOTE — Plan of Care (Signed)
  Problem: Education: Goal: Knowledge of disease or condition will improve Outcome: Progressing Goal: Knowledge of secondary prevention will improve (MUST DOCUMENT ALL) Outcome: Progressing   Problem: Intracerebral Hemorrhage Tissue Perfusion: Goal: Complications of Intracerebral Hemorrhage will be minimized Outcome: Progressing   Problem: Coping: Goal: Will verbalize positive feelings about self Outcome: Progressing Goal: Will identify appropriate support needs Outcome: Progressing   Problem: Health Behavior/Discharge Planning: Goal: Ability to manage health-related needs will improve Outcome: Progressing Goal: Goals will be collaboratively established with patient/family Outcome: Progressing   Problem: Self-Care: Goal: Ability to participate in self-care as condition permits will improve Outcome: Progressing   Problem: Nutrition: Goal: Risk of aspiration will decrease Outcome: Progressing Goal: Dietary intake will improve Outcome: Progressing   Problem: Education: Goal: Knowledge of General Education information will improve Description: Including pain rating scale, medication(s)/side effects and non-pharmacologic comfort measures Outcome: Progressing   Problem: Health Behavior/Discharge Planning: Goal: Ability to manage health-related needs will improve Outcome: Progressing   Problem: Clinical Measurements: Goal: Will remain free from infection Outcome: Progressing   Problem: Activity: Goal: Risk for activity intolerance will decrease Outcome: Progressing   Problem: Nutrition: Goal: Adequate nutrition will be maintained Outcome: Progressing

## 2023-02-22 NOTE — Progress Notes (Signed)
Physical Therapy Treatment Patient Details Name: Nicholas Weiss MRN: [id number] DOB: 06-Nov-1950 Today's Date: 02/22/2023   History of Present Illness Nicholas Weiss is a 72 y.o. African-American male with medical history significant for hypertension, dyslipidemia and type 2 diabetes mellitus, who presented to the emergency room with acute onset of recurrent syncope.  It has been occasionally associated with hypoglycemia.  Few days ago he fell and hit his head yesterday he had a syncopal episode without injuries.  He denied any headache or dizziness or blurred vision.  No paresthesias or focal muscle weakness.  He reported having a R rib fracture from recent fall.    PT Comments  Patient supine in bed upon arrival. Patient very lethargic, flat affect, and maintain eyes closed mainly throughout session but will complete tasks with verbal cues. Abdominal Binder and Bilat Ted Hose donned prior to activity. Patient able to complete bed mobility with CGA. Continue to present with orthostatic vitals (see below for details) limiting participation due to safety concerns. Patient reports he "feels fine", poor insight into syncope. Patient will continue to benefit from acute skilled PT services to address impairments. Discharge recommendation remains appropriate. Will continue to follow acutely.    If plan is discharge home, recommend the following: A little help with walking and/or transfers;Direct supervision/assist for medications management;Assistance with cooking/housework;Assist for transportation;Assistance with feeding;Help with stairs or ramp for entrance;A little help with bathing/dressing/bathroom   Can travel by private vehicle     Yes  Equipment Recommendations  Other (comment) (TBD at next level of care)    Recommendations for Other Services       Precautions / Restrictions Precautions Precautions: Fall Required Braces or Orthoses: Other Brace Other Brace: Abdominal binder, B TED  hose Restrictions Weight Bearing Restrictions: No     Mobility  Bed Mobility Overal bed mobility: Needs Assistance Bed Mobility: Supine to Sit, Sit to Supine     Supine to sit: Contact guard Sit to supine: Contact guard assist   General bed mobility comments: Prior to mobility, ted hose and abdominal binder donned. Patient requires max encouragement to participate, able to complete log roll and supine to sit with CGA.  Flat affect. Barely opens eyes throughout session. Forward flexed seated at EOB despite multimodal cues. Denies lightheadedness sensation despite BP readings. Pt BP supine: 109/68, Seated (1 Min): 77/44, Seated (3 Min): 73/53. Patient very lethargic and minimal participation despite encouragement from therapist.    Transfers                   General transfer comment: deferred due to lethargy this date    Ambulation/Gait               General Gait Details: deferred due to lethargy this date   Stairs             Wheelchair Mobility     Tilt Bed    Modified Rankin (Stroke Patients Only)       Balance Overall balance assessment: Needs assistance Sitting-balance support: Feet supported Sitting balance-Leahy Scale: Fair Sitting balance - Comments: one episode of forward lean but able to correct with steadying assist and multimodal cues.                                    Cognition Arousal: Lethargic Behavior During Therapy: Flat affect Overall Cognitive Status: Difficult to assess  Safety/Judgement: Decreased awareness of safety     General Comments: Poor insight and safety awareness        Exercises      General Comments        Pertinent Vitals/Pain Pain Assessment Pain Assessment: No/denies pain    Home Living                          Prior Function            PT Goals (current goals can now be found in the care plan section) Acute Rehab PT  Goals Patient Stated Goal: To return home safely and without patient being at high risk of falling PT Goal Formulation: With patient/family Time For Goal Achievement: 03/06/23 Progress towards PT goals: Progressing toward goals (slow progress)    Frequency    Min 1X/week      PT Plan      Co-evaluation   Reason for Co-Treatment: For patient/therapist safety;To address functional/ADL transfers PT goals addressed during session: Mobility/safety with mobility;Balance;Proper use of DME OT goals addressed during session: ADL's and self-care;Proper use of Adaptive equipment and DME      AM-PAC PT "6 Clicks" Mobility   Outcome Measure  Help needed turning from your back to your side while in a flat bed without using bedrails?: None Help needed moving from lying on your back to sitting on the side of a flat bed without using bedrails?: A Little Help needed moving to and from a bed to a chair (including a wheelchair)?: A Lot Help needed standing up from a chair using your arms (e.g., wheelchair or bedside chair)?: A Lot Help needed to walk in hospital room?: A Lot Help needed climbing 3-5 steps with a railing? : A Lot 6 Click Score: 15    End of Session Equipment Utilized During Treatment: Gait belt Activity Tolerance: Patient tolerated treatment well Patient left: in bed;with bed alarm set;with call bell/phone within reach Nurse Communication: Mobility status PT Visit Diagnosis: Unsteadiness on feet (R26.81);Repeated falls (R29.6);Difficulty in walking, not elsewhere classified (R26.2)     Time: 1347-1410 PT Time Calculation (min) (ACUTE ONLY): 23 min  Charges:    $Therapeutic Activity: 8-22 mins PT General Charges $$ ACUTE PT VISIT: 1 Visit                     Howie Ill, PT, DPT 02/22/23 3:27 PM

## 2023-02-22 NOTE — Progress Notes (Signed)
[facility] Progress Note  Patient: Nicholas Weiss    XBM:[id number]  DOA: 02/15/2023     Date of Service: the patient was seen and examined on 02/22/2023  Chief Complaint  Patient presents with   Loss of Consciousness   Brief hospital course: KARIM MCCOIG is a 72 y.o. African-American male with PMH of paroxysmal A-fib, type 1 diabetes mellitus, dysautonomia/orthostatic hypotension, hypertension, dyslipidemia, CVA, multiple myeloma, myoclonus/asterixis was seen by Neuro and started keppra,  , who presented to the emergency room with acute onset of recurrent syncope.  It has been occasionally associated with hypoglycemia.  Few days ago he fell and hit his head yesterday he had a syncopal episode without injuries.  He denied any headache or dizziness or blurred vision.  No paresthesias or focal muscle weakness.  He reported having a rib fracture from recent fall.  He has been having right-sided chest/rib pain without palpitations.  No cough or wheezing or dyspnea.  No dysuria, oliguria or hematuria or flank pain.  No bleeding diathesis.   ED Course: When he came to the ER, BP was 171/99 with otherwise normal vital signs.  Labs revealed hyponatremia of 129 and hyperglycemia of 413 with a BUN of 39 creatinine 2.17 comparable to previous levels and high sensitive troponin I was 18.  CBC showed mild leukopenia of 3.2 and anemia close to baseline.   EKG as reviewed by me : Normal sinus rhythm with rate of 81 without acute findings.  QTc was 548 MS. Imaging: On 02/12/2023: Rib x-ray showed minimally displaced fracture of the posterolateral right 10th rib with no acute cardiopulmonary disease.   Noncontrasted CT scan and C-spine CT: 1. Small amount of hyperdense hemorrhage in the right frontal horn, which is new from the 02/12/2023 exam, of indeterminate etiology. No hydrocephalus. 2. No acute fracture or traumatic listhesis in the cervical spine.   - Head  and neck CTA:  1. No evidence of  active extravasation in the right frontal horn hemorrhage. 2. No intracranial large vessel occlusion or significant stenosis.  3. 50% stenosis in the proximal bilateral ICAs, just distal to the bifurcations. 4. Severe stenosis at the origin of the right vertebral artery. 5. Multiple dural based enhancing lesions, the largest of which measures up to 9 mm, which correlate with calcified lesions on the prior head CT, most likely small meningiomas, without significant mass effect. 6. Aortic atherosclerosis.   6 hours later repeat head CT scan without contrast showed stable hemorrhage in the anterior horn of the right lateral ventricle.   The patient was given 5 mg oxycodone, 5 units of subcutaneous NovoLog and 1 L bolus of IV normal saline.  Dr. Katrinka Blazing with neurosurgery was notified about the patient.  admitted to a stepdown unit bed for further evaluation and management.   Assessment and Plan:   # Intraventricular hemorrhage, s/p fall/syncope due to orthostatic hypotension Neurosurgery consulted, recommended conservative management Repeat CT head stable. As per neurosurgery no more scan needed, it should be self-limited and no follow-up needed with neurosurgery.  If situation change then we can repeat CT head and he may follow-up with neurosurgery Hold Eliquis for 2 weeks, if there is a strong indication for him to be on it.  11/23 patient had a fall last night and CT head was done which showed worsening of intraventricular bleeding but it was reviewed by neurosurgeon who recommended no change in follow-up as an outpatient for repeat CT scan, no further intervention. Continue fall precautions   #  Orthostatic hypotension/dysautonomia Started midodrine 10 mg p.o. 3 times daily with holding parameters Started Florinef 0.1 mg p.o. daily with holding parameters Monitor BP and titrate medications accordingly  Paroxysmal A-fib Patient was seen by cardiology in the past, aspirin was discontinued and  patient was started on Eliquis 5 mg p.o. twice daily  # Hypertension Avoid low BP due to orthostatics Use hydralazine 25 mg p.o. TID prn if SBP > 160 mmHg Monitor BP and titrate medications accordingly   Uncontrolled diabetes mellitus with hyperglycemia Type 1 diabetes mellitus Continue Semglee 5 units daily, NovoLog sliding scale, monitor CBG Diabetic coordinator consulted Continue diabetic diet  CKD stage IIIb Baseline creatinine 1.7 Cr 2.16--1.9--2.0--1.68 Continue oral hydration and monitor urine output Avoid nephrotoxic medication, use renally dose medications. US renal.  No hydronephrosis, PVR 102 11/23 started NS 100 mL/h for 10 hours to hydrate and recheck renal functions tomorrow US renal: Prevoid volume 337 ml. Postvoid volume 102 ml. No hydronephrosis.   Hyponatremia, Resolved  pseudohyponatremia due to hyperglycemia. monitor sodium level daily    Diabetic neuropathy  - continue Neurontin.   GERD without esophagitis - continue PPI therapy.   Symptomatic myoclonus and asterixis, involuntary movement Keppra 500mg  BID for symptomatic myoclonus was started by Neurologist on 01/15/2023  H/o CVA, left subcortical infarct, continue Statin ASA was stopped and pt was started on Eliquis, due to paroxysmal A-fib H/o Multiple myeloma, s/p  chemo, recommended to continue to follow with oncologist as an outpatient   Anemia of chronic disease Iron, folate and B12 within normal range Monitor H&H Transfuse if hemoglobin less than 7  Leukopenia and neutropenia, unknown cause. Continue monitor CBC  Vitamin D Insufficiency: started vitamin D 50,000 units p.o. weekly, follow with PCP to repeat vitamin D level after 3 to 6 months.   Body mass index is 19.74 kg/m.  Interventions:  Diet: Carb modified diet DVT Prophylaxis: SCD, pharmacological prophylaxis contraindicated due to ICH    Advance goals of care discussion: Full code  Family Communication: family was not  present at bedside, at the time of interview.  The pt provided permission to discuss medical plan with the family. Opportunity was given to ask question and all questions were answered satisfactorily.  11/21 discussed with patient's friend over the phone 11/23 discussed with patient's friend over the phone   Disposition:  Pt is from Home, admitted with intraventricular hemorrhage, orthostatic hypotension, syncope, still has orthostatic vitals, which is chronic issue, otherwise patient is clinically stable, medically optimized to discharge to SNF  Discharge to SNF, when bed will be available.  TOC is following for disposition plan.    Subjective: No significant overnight events, patient was resting overnight, patient wanted to use bathroom, he was being helped by nurse tech to use bedside commode.  Patient stated that he is feeling fine, no complaints.   Physical Exam: General: NAD, lying comfortably Appear in no distress, affect appropriate Eyes: PERRLA ENT: Oral Mucosa Clear, moist  Neck: no JVD,  Cardiovascular: S1 and S2 Present, no Murmur,  Respiratory: good respiratory effort, Bilateral Air entry equal and Decreased, no Crackles, no wheezes Abdomen: Bowel Sound present, Soft and no tenderness,  Skin: no rashes Extremities: no Pedal edema, no calf tenderness, s/p left first and second toe amputation Neurologic: without any new focal findings Gait not checked due to patient safety concerns  Vitals:   02/21/23 1618 02/21/23 1948 02/22/23 0713 02/22/23 0803  BP: (!) 153/91 (!) 173/99 (!) 148/91 129/81  Pulse: 74 73 77 73  Resp: 16 19 18 16   Temp: 97.6 F (36.4 C) (!) 97.5 F (36.4 C) 98.3 F (36.8 C) 98.5 F (36.9 C)  TempSrc: Oral Oral Oral   SpO2: 100% 100% 100% 100%  Weight:      Height:        Intake/Output Summary (Last 24 hours) at 02/22/2023 1203 Last data filed at 02/22/2023 1100 Gross per 24 hour  Intake 1959.86 ml  Output 1050 ml  Net 909.86 ml   Filed  Weights   02/15/23 1713 02/16/23 0545 02/17/23 0044  Weight: 64 kg 65 kg 62.4 kg    Data Reviewed: I have personally reviewed and interpreted daily labs, tele strips, imagings as discussed above. I reviewed all nursing notes, pharmacy notes, vitals, pertinent old records I have discussed plan of care as described above with RN and patient/family.  CBC: Recent Labs  Lab 02/18/23 0448 02/19/23 0524 02/20/23 0414 02/21/23 0420 02/22/23 0620  WBC 3.2* 2.6* 2.7* 3.0* 3.1*  NEUTROABS 1.5* 1.2* 1.5* 1.5* 1.4*  HGB 8.5* 8.8* 8.6* 7.6* 8.1*  HCT 25.8* 26.7* 25.7* 23.2* 24.9*  MCV 90.8 92.4 91.5 93.5 94.3  PLT 244 206 241 223 217   Basic Metabolic Panel: Recent Labs  Lab 02/15/23 1826 02/16/23 0827 02/18/23 0448 02/19/23 0524 02/20/23 0414 02/21/23 0420 02/22/23 0620  NA 129*   < > 134* 136 135 139 139  K 5.1   < > 4.6 4.1 4.4 4.9 4.2  CL 99   < > 104 106 104 107 109  CO2 22   < > 21* 22 24 25 24   GLUCOSE 413*   < > 158* 81 165* 249* 99  BUN 39*   < > 51* 46* 42* 42* 36*  CREATININE 2.17*   < > 2.16* 1.90* 2.00* 1.76* 1.68*  CALCIUM 8.6*   < > 8.5* 8.6* 8.4* 8.0* 8.2*  MG 2.2  --  2.3 2.3 2.2  --  1.8  PHOS  --   --  3.6 4.1 3.5  --  2.9   < > = values in this interval not displayed.    Studies: No results found.  Scheduled Meds:   stroke: early stages of recovery book   Does not apply Once   atorvastatin  80 mg Oral QHS   feeding supplement  237 mL Oral BID BM   fludrocortisone  0.1 mg Oral Daily   gabapentin  300 mg Oral BID   insulin aspart  0-5 Units Subcutaneous QHS   insulin aspart  0-9 Units Subcutaneous TID WC   insulin glargine-yfgn  5 Units Subcutaneous Daily   levETIRAcetam  500 mg Oral BID   midodrine  10 mg Oral TID WC   multivitamin with minerals  1 tablet Oral Daily   mupirocin ointment  1 Application Nasal BID   pantoprazole  40 mg Oral QHS   QUEtiapine  50 mg Oral BID   senna-docusate  1 tablet Oral BID   Vitamin D (Ergocalciferol)  50,000  Units Oral Q7 days   Continuous Infusions:   PRN Meds: acetaminophen **OR** acetaminophen (TYLENOL) oral liquid 160 mg/5 mL **OR** acetaminophen, hydrALAZINE, LORazepam, ondansetron (ZOFRAN) IV, traMADol, traZODone, ziprasidone  Time spent: 40 minutes  Author: Gillis Santa. MD [facility] 02/22/2023 12:03 PM  To reach On-call, see care teams to locate the attending and reach out to them via [email or web address]. If 7PM-7AM, please contact night-coverage If you still have difficulty reaching the attending provider, please page the [facility] (Director on Call)  for [facility] on amion for assistance.

## 2023-02-23 DIAGNOSIS — I615 Nontraumatic intracerebral hemorrhage, intraventricular: Secondary | ICD-10-CM | POA: Diagnosis not present

## 2023-02-23 LAB — BASIC METABOLIC PANEL
Anion gap: 7 (ref 5–15)
BUN: 35 mg/dL — ABNORMAL HIGH (ref 8–23)
CO2: 22 mmol/L (ref 22–32)
Calcium: 8 mg/dL — ABNORMAL LOW (ref 8.9–10.3)
Chloride: 108 mmol/L (ref 98–111)
Creatinine, Ser: 1.63 mg/dL — ABNORMAL HIGH (ref 0.61–1.24)
GFR, Estimated: 45 mL/min — ABNORMAL LOW (ref >60)
Glucose, Bld: 301 mg/dL — ABNORMAL HIGH (ref 70–99)
Potassium: 4.3 mmol/L (ref 3.5–5.1)
Sodium: 137 mmol/L (ref 135–145)

## 2023-02-23 LAB — CBC WITH DIFFERENTIAL/PLATELET
Abs Immature Granulocytes: 0.01 10*3/uL (ref 0.00–0.07)
Basophils Absolute: 0 10*3/uL (ref 0.0–0.1)
Basophils Relative: 0 %
Eosinophils Absolute: 0.1 10*3/uL (ref 0.0–0.5)
Eosinophils Relative: 2 %
HCT: 24.4 % — ABNORMAL LOW (ref 39.0–52.0)
Hemoglobin: 7.9 g/dL — ABNORMAL LOW (ref 13.0–17.0)
Immature Granulocytes: 0 %
Lymphocytes Relative: 29 %
Lymphs Abs: 1.1 10*3/uL (ref 0.7–4.0)
MCH: 30 pg (ref 26.0–34.0)
MCHC: 32.4 g/dL (ref 30.0–36.0)
MCV: 92.8 fL (ref 80.0–100.0)
Monocytes Absolute: 0.4 10*3/uL (ref 0.1–1.0)
Monocytes Relative: 10 %
Neutro Abs: 2.3 10*3/uL (ref 1.7–7.7)
Neutrophils Relative %: 59 %
Platelets: 228 10*3/uL (ref 150–400)
RBC: 2.63 MIL/uL — ABNORMAL LOW (ref 4.22–5.81)
RDW: 14.9 % (ref 11.5–15.5)
WBC: 3.9 10*3/uL — ABNORMAL LOW (ref 4.0–10.5)
nRBC: 0 % (ref 0.0–0.2)

## 2023-02-23 LAB — GLUCOSE, CAPILLARY
Glucose-Capillary: 301 mg/dL — ABNORMAL HIGH (ref 70–99)
Glucose-Capillary: 179 mg/dL — ABNORMAL HIGH (ref 70–99)
Glucose-Capillary: 195 mg/dL — ABNORMAL HIGH (ref 70–99)
Glucose-Capillary: 183 mg/dL — ABNORMAL HIGH (ref 70–99)
Glucose-Capillary: 177 mg/dL — ABNORMAL HIGH (ref 70–99)

## 2023-02-23 MED ORDER — QUETIAPINE FUMARATE 25 MG PO TABS: 25.0000 mg | ORAL_TABLET | Freq: Every evening | ORAL | Status: DC

## 2023-02-23 NOTE — Progress Notes (Signed)
[facility] Progress Note  Patient: Nicholas Weiss    YQM:[id number]  DOA: 02/15/2023     Date of Service: the patient was seen and examined on 02/23/2023  Chief Complaint  Patient presents with   Loss of Consciousness   Brief hospital course: Nicholas Weiss is a 73 y.o. African-American male with PMH of paroxysmal A-fib, type 1 diabetes mellitus, dysautonomia/orthostatic hypotension, hypertension, dyslipidemia, CVA, multiple myeloma, myoclonus/asterixis was seen by Neuro and started keppra,  , who presented to the emergency room with acute onset of recurrent syncope.  It has been occasionally associated with hypoglycemia.  Few days ago he fell and hit his head yesterday he had a syncopal episode without injuries.  He denied any headache or dizziness or blurred vision.  No paresthesias or focal muscle weakness.  He reported having a rib fracture from recent fall.  He has been having right-sided chest/rib pain without palpitations.  No cough or wheezing or dyspnea.  No dysuria, oliguria or hematuria or flank pain.  No bleeding diathesis.   ED Course: When he came to the ER, BP was 171/99 with otherwise normal vital signs.  Labs revealed hyponatremia of 129 and hyperglycemia of 413 with a BUN of 39 creatinine 2.17 comparable to previous levels and high sensitive troponin I was 18.  CBC showed mild leukopenia of 3.2 and anemia close to baseline.   EKG as reviewed by me : Normal sinus rhythm with rate of 81 without acute findings.  QTc was 548 MS. Imaging: On 02/12/2023: Rib x-ray showed minimally displaced fracture of the posterolateral right 10th rib with no acute cardiopulmonary disease.   Noncontrasted CT scan and C-spine CT: 1. Small amount of hyperdense hemorrhage in the right frontal horn, which is new from the 02/12/2023 exam, of indeterminate etiology. No hydrocephalus. 2. No acute fracture or traumatic listhesis in the cervical spine.   - Head  and neck CTA:  1. No evidence of  active extravasation in the right frontal horn hemorrhage. 2. No intracranial large vessel occlusion or significant stenosis.  3. 50% stenosis in the proximal bilateral ICAs, just distal to the bifurcations. 4. Severe stenosis at the origin of the right vertebral artery. 5. Multiple dural based enhancing lesions, the largest of which measures up to 9 mm, which correlate with calcified lesions on the prior head CT, most likely small meningiomas, without significant mass effect. 6. Aortic atherosclerosis.   6 hours later repeat head CT scan without contrast showed stable hemorrhage in the anterior horn of the right lateral ventricle.   The patient was given 5 mg oxycodone, 5 units of subcutaneous NovoLog and 1 L bolus of IV normal saline.  Dr. Katrinka Blazing with neurosurgery was notified about the patient.  admitted to a stepdown unit bed for further evaluation and management.   Assessment and Plan:   # Intraventricular hemorrhage, s/p fall/syncope due to orthostatic hypotension Neurosurgery consulted, recommended conservative management Repeat CT head stable. As per neurosurgery no more scan needed, it should be self-limited and no follow-up needed with neurosurgery.  If situation change then we can repeat CT head and he may follow-up with neurosurgery Hold Eliquis for 2 weeks,  11/23 patient had a fall last night and CT head was done which showed worsening of intraventricular bleeding but it was reviewed by neurosurgeon who recommended no change in follow-up as an outpatient for repeat CT scan, no further intervention. Continue fall precautions   # Orthostatic hypotension/dysautonomia Started midodrine 10 mg p.o. 3 times daily  with holding parameters Started Florinef 0.1 mg p.o. daily with holding parameters Monitor BP and titrate medications accordingly  Paroxysmal A-fib Patient was seen by cardiology in the past, aspirin was discontinued and patient was started on Eliquis 5 mg p.o. twice  daily  # Hypertension Avoid low BP due to orthostatics Use hydralazine 25 mg p.o. TID prn if SBP > 160 mmHg Monitor BP and titrate medications accordingly   Uncontrolled diabetes mellitus with hyperglycemia Type 1 diabetes mellitus Continue Semglee 5 units daily, NovoLog sliding scale, monitor CBG Diabetic coordinator consulted Continue diabetic diet  CKD stage IIIb Baseline creatinine 1.7 Cr 2.16--1.9--2.0--1.63 Continue oral hydration and monitor urine output Avoid nephrotoxic medication, use renally dose medications. US renal.  No hydronephrosis, PVR 102 11/23 started NS 100 mL/h for 10 hours to hydrate and recheck renal functions  US renal: Prevoid volume 337 ml. Postvoid volume 102 ml. No hydronephrosis.   Hyponatremia, Resolved  pseudohyponatremia due to hyperglycemia. monitor sodium level daily    Diabetic neuropathy  - continue Neurontin.   GERD without esophagitis - continue PPI therapy.   Symptomatic myoclonus and asterixis, involuntary movement Keppra 500mg  BID for symptomatic myoclonus was started by Neurologist on 01/15/2023  H/o CVA, left subcortical infarct, continue Statin ASA was stopped and pt was started on Eliquis, due to paroxysmal A-fib H/o Multiple myeloma, s/p  chemo, recommended to continue to follow with oncologist as an outpatient   Anemia of chronic disease Iron, folate and B12 within normal range Monitor H&H Transfuse if hemoglobin less than 7  Leukopenia and neutropenia, unknown cause. 11/26 ANC 2.3, leukopenia resolved, WBC improving 3.9 today Continue monitor CBC  Vitamin D Insufficiency: started vitamin D 50,000 units p.o. weekly, follow with PCP to repeat vitamin D level after 3 to 6 months.   Body mass index is 19.74 kg/m.  Interventions:  Diet: Carb modified diet DVT Prophylaxis: SCD, pharmacological prophylaxis contraindicated due to ICH    Advance goals of care discussion: Full code  Family Communication: family was  not present at bedside, at the time of interview.  The pt provided permission to discuss medical plan with the family. Opportunity was given to ask question and all questions were answered satisfactorily.  11/21 discussed with patient's friend over the phone 11/23 discussed with patient's friend over the phone   Disposition:  Pt is from Home, admitted with intraventricular hemorrhage, orthostatic hypotension, syncope, still has orthostatic vitals, which is chronic issue, otherwise patient is clinically stable, medically optimized to discharge to SNF  Discharge to SNF, when bed will be available.  TOC is following for disposition plan.   11/26 patient was denied for short-term rehab, family appealed which was faxed by [facility], for final decision  Subjective: No significant overnight events, patient was resting comfortably, denies any dizziness while standing.  Patient is AAO x 2 to 3, slightly confused.   Physical Exam: General: NAD, lying comfortably Appear in no distress, affect appropriate Eyes: PERRLA ENT: Oral Mucosa Clear, moist  Neck: no JVD,  Cardiovascular: S1 and S2 Present, no Murmur,  Respiratory: good respiratory effort, Bilateral Air entry equal and Decreased, no Crackles, no wheezes Abdomen: Bowel Sound present, Soft and no tenderness,  Skin: no rashes Extremities: no Pedal edema, no calf tenderness, s/p left first and second toe amputation Neurologic: without any new focal findings Gait not checked due to patient safety concerns  Vitals:   02/23/23 0129 02/23/23 0130 02/23/23 0423 02/23/23 0800  BP: 113/71 137/83 123/78 118/75  Pulse: 92 85  93 84  Resp:   18 16  Temp:   98.4 F (36.9 C) 98.3 F (36.8 C)  TempSrc:   Oral   SpO2: 97% 100% 98% 98%  Weight:      Height:       No intake or output data in the 24 hours ending 02/23/23 1314  Filed Weights   02/15/23 1713 02/16/23 0545 02/17/23 0044  Weight: 64 kg 65 kg 62.4 kg    Data Reviewed: I have personally  reviewed and interpreted daily labs, tele strips, imagings as discussed above. I reviewed all nursing notes, pharmacy notes, vitals, pertinent old records I have discussed plan of care as described above with RN and patient/family.  CBC: Recent Labs  Lab 02/19/23 0524 02/20/23 0414 02/21/23 0420 02/22/23 0620 02/23/23 0549  WBC 2.6* 2.7* 3.0* 3.1* 3.9*  NEUTROABS 1.2* 1.5* 1.5* 1.4* 2.3  HGB 8.8* 8.6* 7.6* 8.1* 7.9*  HCT 26.7* 25.7* 23.2* 24.9* 24.4*  MCV 92.4 91.5 93.5 94.3 92.8  PLT 206 241 223 217 228   Basic Metabolic Panel: Recent Labs  Lab 02/18/23 0448 02/19/23 0524 02/20/23 0414 02/21/23 0420 02/22/23 0620 02/23/23 0549  NA 134* 136 135 139 139 137  K 4.6 4.1 4.4 4.9 4.2 4.3  CL 104 106 104 107 109 108  CO2 21* 22 24 25 24 22   GLUCOSE 158* 81 165* 249* 99 301*  BUN 51* 46* 42* 42* 36* 35*  CREATININE 2.16* 1.90* 2.00* 1.76* 1.68* 1.63*  CALCIUM 8.5* 8.6* 8.4* 8.0* 8.2* 8.0*  MG 2.3 2.3 2.2  --  1.8  --   PHOS 3.6 4.1 3.5  --  2.9  --     Studies: No results found.  Scheduled Meds:   stroke: early stages of recovery book   Does not apply Once   atorvastatin  80 mg Oral QHS   feeding supplement (GLUCERNA SHAKE)  237 mL Oral TID BM   fludrocortisone  0.1 mg Oral Daily   gabapentin  300 mg Oral BID   insulin aspart  0-5 Units Subcutaneous QHS   insulin aspart  0-9 Units Subcutaneous TID WC   insulin glargine-yfgn  5 Units Subcutaneous Daily   levETIRAcetam  500 mg Oral BID   midodrine  10 mg Oral TID WC   multivitamin with minerals  1 tablet Oral Daily   pantoprazole  40 mg Oral QHS   QUEtiapine  50 mg Oral BID   senna-docusate  1 tablet Oral BID   Vitamin D (Ergocalciferol)  50,000 Units Oral Q7 days   Continuous Infusions:   PRN Meds: acetaminophen **OR** acetaminophen (TYLENOL) oral liquid 160 mg/5 mL **OR** acetaminophen, hydrALAZINE, loperamide, LORazepam, ondansetron (ZOFRAN) IV, traMADol, traZODone, ziprasidone  Time spent: 40  minutes  Author: Gillis Santa. MD [facility] 02/23/2023 1:14 PM  To reach On-call, see care teams to locate the attending and reach out to them via [email or web address]. If 7PM-7AM, please contact night-coverage If you still have difficulty reaching the attending provider, please page the [facility] (Director on Call) for [facility] on amion for assistance.

## 2023-02-23 NOTE — Plan of Care (Signed)
  Problem: Education: Goal: Knowledge of disease or condition will improve Outcome: Progressing Goal: Knowledge of secondary prevention will improve (MUST DOCUMENT ALL) Outcome: Progressing Goal: Knowledge of patient specific risk factors will improve Loraine Leriche N/A or DELETE if not current risk factor) Outcome: Progressing   Problem: Intracerebral Hemorrhage Tissue Perfusion: Goal: Complications of Intracerebral Hemorrhage will be minimized Outcome: Progressing   Problem: Coping: Goal: Will verbalize positive feelings about self Outcome: Progressing Goal: Will identify appropriate support needs Outcome: Progressing   Problem: Health Behavior/Discharge Planning: Goal: Ability to manage health-related needs will improve Outcome: Progressing Goal: Goals will be collaboratively established with patient/family Outcome: Progressing   Problem: Self-Care: Goal: Ability to participate in self-care as condition permits will improve Outcome: Progressing Goal: Verbalization of feelings and concerns over difficulty with self-care will improve Outcome: Progressing Goal: Ability to communicate needs accurately will improve Outcome: Progressing   Problem: Nutrition: Goal: Risk of aspiration will decrease Outcome: Progressing Goal: Dietary intake will improve Outcome: Progressing   Problem: Education: Goal: Knowledge of General Education information will improve Description: Including pain rating scale, medication(s)/side effects and non-pharmacologic comfort measures Outcome: Progressing   Problem: Health Behavior/Discharge Planning: Goal: Ability to manage health-related needs will improve Outcome: Progressing   Problem: Clinical Measurements: Goal: Ability to maintain clinical measurements within normal limits will improve Outcome: Progressing Goal: Will remain free from infection Outcome: Progressing Goal: Diagnostic test results will improve Outcome: Progressing Goal:  Respiratory complications will improve Outcome: Progressing Goal: Cardiovascular complication will be avoided Outcome: Progressing   Problem: Activity: Goal: Risk for activity intolerance will decrease Outcome: Progressing   Problem: Nutrition: Goal: Adequate nutrition will be maintained Outcome: Progressing   Problem: Coping: Goal: Level of anxiety will decrease Outcome: Progressing   Problem: Elimination: Goal: Will not experience complications related to bowel motility Outcome: Progressing Goal: Will not experience complications related to urinary retention Outcome: Progressing   Problem: Pain Management: Goal: General experience of comfort will improve Outcome: Progressing   Problem: Safety: Goal: Ability to remain free from injury will improve Outcome: Progressing   Problem: Skin Integrity: Goal: Risk for impaired skin integrity will decrease Outcome: Progressing   Problem: Education: Goal: Ability to describe self-care measures that may prevent or decrease complications (Diabetes Survival Skills Education) will improve Outcome: Progressing Goal: Individualized Educational Video(s) Outcome: Progressing   Problem: Coping: Goal: Ability to adjust to condition or change in health will improve Outcome: Progressing   Problem: Fluid Volume: Goal: Ability to maintain a balanced intake and output will improve Outcome: Progressing   Problem: Health Behavior/Discharge Planning: Goal: Ability to identify and utilize available resources and services will improve Outcome: Progressing Goal: Ability to manage health-related needs will improve Outcome: Progressing   Problem: Metabolic: Goal: Ability to maintain appropriate glucose levels will improve Outcome: Progressing   Problem: Nutritional: Goal: Maintenance of adequate nutrition will improve Outcome: Progressing Goal: Progress toward achieving an optimal weight will improve Outcome: Progressing   Problem:  Skin Integrity: Goal: Risk for impaired skin integrity will decrease Outcome: Progressing   Problem: Tissue Perfusion: Goal: Adequacy of tissue perfusion will improve Outcome: Progressing

## 2023-02-23 NOTE — Plan of Care (Signed)
Problem: Education: Goal: Knowledge of disease or condition will improve 02/23/2023 0622 by Dennie Maizes, LPN Outcome: Progressing 02/23/2023 0437 by Dennie Maizes, LPN Outcome: Progressing Goal: Knowledge of secondary prevention will improve (MUST DOCUMENT ALL) 02/23/2023 0622 by Dennie Maizes, LPN Outcome: Progressing 02/23/2023 0437 by Dennie Maizes, LPN Outcome: Progressing Goal: Knowledge of patient specific risk factors will improve Loraine Leriche N/A or DELETE if not current risk factor) 02/23/2023 0622 by Dennie Maizes, LPN Outcome: Progressing 02/23/2023 0437 by Dennie Maizes, LPN Outcome: Progressing   Problem: Intracerebral Hemorrhage Tissue Perfusion: Goal: Complications of Intracerebral Hemorrhage will be minimized 02/23/2023 0622 by Dennie Maizes, LPN Outcome: Progressing 02/23/2023 0437 by Dennie Maizes, LPN Outcome: Progressing   Problem: Coping: Goal: Will verbalize positive feelings about self 02/23/2023 0622 by Dennie Maizes, LPN Outcome: Progressing 02/23/2023 0437 by Dennie Maizes, LPN Outcome: Progressing Goal: Will identify appropriate support needs 02/23/2023 0622 by Dennie Maizes, LPN Outcome: Progressing 02/23/2023 0437 by Dennie Maizes, LPN Outcome: Progressing   Problem: Health Behavior/Discharge Planning: Goal: Ability to manage health-related needs will improve 02/23/2023 0622 by Dennie Maizes, LPN Outcome: Progressing 02/23/2023 0437 by Dennie Maizes, LPN Outcome: Progressing Goal: Goals will be collaboratively established with patient/family 02/23/2023 0622 by Dennie Maizes, LPN Outcome: Progressing 02/23/2023 0437 by Dennie Maizes, LPN Outcome: Progressing   Problem: Self-Care: Goal: Ability to participate in self-care as condition permits will improve 02/23/2023 0622 by Dennie Maizes, LPN Outcome: Progressing 02/23/2023 0437 by Dennie Maizes, LPN Outcome: Progressing Goal: Verbalization of feelings and concerns over difficulty with self-care will improve 02/23/2023 0622 by Dennie Maizes, LPN Outcome: Progressing 02/23/2023 0437 by Dennie Maizes, LPN Outcome: Progressing Goal: Ability to communicate needs accurately will improve 02/23/2023 0622 by Dennie Maizes, LPN Outcome: Progressing 02/23/2023 0437 by Dennie Maizes, LPN Outcome: Progressing   Problem: Nutrition: Goal: Risk of aspiration will decrease 02/23/2023 0622 by Dennie Maizes, LPN Outcome: Progressing 02/23/2023 0437 by Dennie Maizes, LPN Outcome: Progressing Goal: Dietary intake will improve 02/23/2023 0622 by Dennie Maizes, LPN Outcome: Progressing 02/23/2023 0437 by Dennie Maizes, LPN Outcome: Progressing   Problem: Education: Goal: Knowledge of General Education information will improve Description: Including pain rating scale, medication(s)/side effects and non-pharmacologic comfort measures 02/23/2023 0622 by Dennie Maizes, LPN Outcome: Progressing 02/23/2023 0437 by Dennie Maizes, LPN Outcome: Progressing   Problem: Health Behavior/Discharge Planning: Goal: Ability to manage health-related needs will improve 02/23/2023 0622 by Dennie Maizes, LPN Outcome: Progressing 02/23/2023 0437 by Dennie Maizes, LPN Outcome: Progressing   Problem: Clinical Measurements: Goal: Ability to maintain clinical measurements within normal limits will improve 02/23/2023 0622 by Dennie Maizes, LPN Outcome: Progressing 02/23/2023 0437 by Dennie Maizes, LPN Outcome: Progressing Goal: Will remain free from infection 02/23/2023 0622 by Dennie Maizes, LPN Outcome: Progressing 02/23/2023 0437 by Dennie Maizes, LPN Outcome: Progressing Goal: Diagnostic test results will improve 02/23/2023 0622 by Dennie Maizes, LPN Outcome: Progressing 02/23/2023 0437  by Dennie Maizes, LPN Outcome: Progressing Goal: Respiratory complications will improve 02/23/2023 0622 by Dennie Maizes, LPN Outcome: Progressing 02/23/2023 0437 by Dennie Maizes, LPN Outcome: Progressing Goal: Cardiovascular complication will be avoided 02/23/2023 0622 by Dennie Maizes, LPN Outcome: Progressing 02/23/2023 0437 by Dennie Maizes, LPN Outcome: Progressing   Problem: Activity: Goal: Risk for activity intolerance will decrease 02/23/2023 0622 by Dennie Maizes, LPN Outcome: Progressing  02/23/2023 0437 by Dennie Maizes, LPN Outcome: Progressing   Problem: Nutrition: Goal: Adequate nutrition will be maintained 02/23/2023 0622 by Dennie Maizes, LPN Outcome: Progressing 02/23/2023 0437 by Dennie Maizes, LPN Outcome: Progressing   Problem: Coping: Goal: Level of anxiety will decrease 02/23/2023 0622 by Dennie Maizes, LPN Outcome: Progressing 02/23/2023 0437 by Dennie Maizes, LPN Outcome: Progressing   Problem: Elimination: Goal: Will not experience complications related to bowel motility 02/23/2023 0622 by Dennie Maizes, LPN Outcome: Progressing 02/23/2023 0437 by Dennie Maizes, LPN Outcome: Progressing Goal: Will not experience complications related to urinary retention 02/23/2023 0622 by Dennie Maizes, LPN Outcome: Progressing 02/23/2023 0437 by Dennie Maizes, LPN Outcome: Progressing   Problem: Pain Management: Goal: General experience of comfort will improve 02/23/2023 0622 by Dennie Maizes, LPN Outcome: Progressing 02/23/2023 0437 by Dennie Maizes, LPN Outcome: Progressing   Problem: Safety: Goal: Ability to remain free from injury will improve 02/23/2023 0622 by Dennie Maizes, LPN Outcome: Progressing 02/23/2023 0437 by Dennie Maizes, LPN Outcome: Progressing   Problem: Skin Integrity: Goal: Risk for impaired skin integrity will  decrease 02/23/2023 0622 by Dennie Maizes, LPN Outcome: Progressing 02/23/2023 0437 by Dennie Maizes, LPN Outcome: Progressing   Problem: Education: Goal: Ability to describe self-care measures that may prevent or decrease complications (Diabetes Survival Skills Education) will improve 02/23/2023 0622 by Dennie Maizes, LPN Outcome: Progressing 02/23/2023 0437 by Dennie Maizes, LPN Outcome: Progressing Goal: Individualized Educational Video(s) 02/23/2023 0622 by Dennie Maizes, LPN Outcome: Progressing 02/23/2023 0437 by Dennie Maizes, LPN Outcome: Progressing   Problem: Coping: Goal: Ability to adjust to condition or change in health will improve 02/23/2023 0622 by Dennie Maizes, LPN Outcome: Progressing 02/23/2023 0437 by Dennie Maizes, LPN Outcome: Progressing   Problem: Fluid Volume: Goal: Ability to maintain a balanced intake and output will improve 02/23/2023 0622 by Dennie Maizes, LPN Outcome: Progressing 02/23/2023 0437 by Dennie Maizes, LPN Outcome: Progressing   Problem: Health Behavior/Discharge Planning: Goal: Ability to identify and utilize available resources and services will improve 02/23/2023 0622 by Dennie Maizes, LPN Outcome: Progressing 02/23/2023 0437 by Dennie Maizes, LPN Outcome: Progressing Goal: Ability to manage health-related needs will improve 02/23/2023 0622 by Dennie Maizes, LPN Outcome: Progressing 02/23/2023 0437 by Dennie Maizes, LPN Outcome: Progressing   Problem: Metabolic: Goal: Ability to maintain appropriate glucose levels will improve 02/23/2023 0622 by Dennie Maizes, LPN Outcome: Progressing 02/23/2023 0437 by Dennie Maizes, LPN Outcome: Progressing   Problem: Nutritional: Goal: Maintenance of adequate nutrition will improve 02/23/2023 0622 by Dennie Maizes, LPN Outcome: Progressing 02/23/2023 0437 by Dennie Maizes,  LPN Outcome: Progressing Goal: Progress toward achieving an optimal weight will improve 02/23/2023 0622 by Dennie Maizes, LPN Outcome: Progressing 02/23/2023 0437 by Dennie Maizes, LPN Outcome: Progressing   Problem: Skin Integrity: Goal: Risk for impaired skin integrity will decrease 02/23/2023 0622 by Dennie Maizes, LPN Outcome: Progressing 02/23/2023 0437 by Dennie Maizes, LPN Outcome: Progressing   Problem: Tissue Perfusion: Goal: Adequacy of tissue perfusion will improve 02/23/2023 0622 by Dennie Maizes, LPN Outcome: Progressing 02/23/2023 0437 by Dennie Maizes, LPN Outcome: Progressing

## 2023-02-23 NOTE — TOC Progression Note (Addendum)
Transition of Care [facility]) - Progression Note    Patient Details  Name: Nicholas Weiss MRN: [id number] Date of Birth: Jul 17, 1950  Transition of Care [facility]) CM/SW Contact  Allena Katz, LCSW Phone Number: 02/23/2023, 10:01 AM  Clinical Narrative:  Pt denied for STR. CSW to follow up with family to notify them and see if they would like to appeal.   Fax [phone] Fast Appeal.  CSW spoke with karen who reports she would like to appeal. Fast appeal faxed to [phone].    Expected Discharge Plan: Home/Self Care Barriers to Discharge: Continued Medical Work up  Expected Discharge Plan and Services       Living arrangements for the past 2 months: Single Family Home                           HH Arranged: PT HH Agency: [facility] Date HH Agency Contacted: 02/16/23 Time HH Agency Contacted: 1559 Representative spoke with at [facility] Agency: [facility]   Social Determinants of Health (SDOH) Interventions SDOH Screenings   Food Insecurity: No Food Insecurity (02/16/2023)  Housing: Low Risk  (02/16/2023)  Transportation Needs: No Transportation Needs (02/16/2023)  Utilities: Not At Risk (02/16/2023)  Alcohol Screen: Low Risk  (10/02/2020)  Depression (PHQ2-9): Medium Risk (10/02/2020)  Financial Resource Strain: Low Risk  (01/26/2023)   Received from [facility]  Physical Activity: Insufficiently Active (01/01/2020)  Social Connections: Moderately Isolated (01/01/2020)  Stress: No Stress Concern Present (01/01/2020)  Tobacco Use: Low Risk  (02/15/2023)  Recent Concern: Tobacco Use - Medium Risk (02/15/2023)   Received from [facility]    Readmission Risk Interventions    02/16/2023    3:56 PM 08/24/2022   10:57 AM 05/20/2022   11:41 AM  Readmission Risk Prevention Plan  Transportation Screening Complete Complete Complete  Medication Review Oceanographer) Complete Complete Complete  PCP or Specialist appointment within 3-5 days of  discharge Complete Complete Complete  HRI or Home Care Consult Complete Complete Complete  SW Recovery Care/Counseling Consult Not Complete    Palliative Care Screening Not Applicable  Complete  Skilled Nursing Facility Complete Complete Complete

## 2023-02-23 NOTE — Progress Notes (Signed)
Physical Therapy Treatment Patient Details Name: Nicholas Weiss MRN: [id number] DOB: 04/09/1950 Today's Date: 02/23/2023   History of Present Illness Nicholas Weiss is a 72 y.o. African-American male with medical history significant for hypertension, dyslipidemia and type 2 diabetes mellitus, who presented to the emergency room with acute onset of recurrent syncope.  It has been occasionally associated with hypoglycemia.  Few days ago he fell and hit his head yesterday he had a syncopal episode without injuries.  He denied any headache or dizziness or blurred vision.  No paresthesias or focal muscle weakness.  He reported having a R rib fracture from recent fall.    PT Comments  Pt resting in bed (eating dinner) upon PT arrival; pt agreeable to therapy; pt A&Ox4 (although pt reporting general confusion at times).  Mild R rib pain reported during session.  Pt's BP 144/88 (MAP 106) with HR 72 bpm resting in bed.  Pt CGA semi-supine to sitting EOB.  Pt's BP 125/81 (MAP 95) with HR 72 bpm sitting EOB (pt asymptomatic).  Performed sitting LE ex's prior to standing.  Pt stood with min assist up to RW (pt reporting mild dizziness in standing but pt reporting he felt fine to walk and symptoms resolved during ambulation per pt report); pt then ambulated 100 feet with RW use with close chair follow (initially intermittent min assist for balance but with increased distance ambulating pt noted to mildly scissoring feet and requiring consistent min assist for balance so therapist had pt sit down in recliner to check BP).  Pt's BP 93/67 (MAP 76) with HR 80 bpm sitting in recliner; pt asymptomatic.  B TED hose and abdominal binder used for sessions activities.  Nurse updated on pt's symptoms and BP during session.  Will continue to focus on strengthening, balance, and progressive functional mobility during hospitalization.   If plan is discharge home, recommend the following: A little help with walking and/or  transfers;Direct supervision/assist for medications management;Assistance with cooking/housework;Assist for transportation;Help with stairs or ramp for entrance;A little help with bathing/dressing/bathroom   Can travel by private vehicle        Equipment Recommendations  Rolling walker (2 wheels);Wheelchair (measurements PT);Wheelchair cushion (measurements PT);BSC/3in1    Recommendations for Other Services       Precautions / Restrictions Precautions Precautions: Fall Required Braces or Orthoses: Other Brace Other Brace: Abdominal binder, B TED hose Restrictions Weight Bearing Restrictions: No     Mobility  Bed Mobility Overal bed mobility: Needs Assistance Bed Mobility: Supine to Sit     Supine to sit: Contact guard, Used rails     General bed mobility comments: mild increased effort to perform on own    Transfers Overall transfer level: Needs assistance Equipment used: Rolling walker (2 wheels) Transfers: Sit to/from Stand Sit to Stand: Min assist           General transfer comment: mild increased effort to stand from bed up to RW; assist to control descent sitting in recliner post ambulation    Ambulation/Gait Ambulation/Gait assistance: Contact guard assist, Min assist Gait Distance (Feet): 100 Feet Assistive device: Rolling walker (2 wheels) Gait Pattern/deviations: Step-through pattern, Decreased step length - right, Decreased step length - left Gait velocity: decreased     General Gait Details: intermittent mild unsteadiness initially but pt starting to scissor feet mildly with increased distance ambulating requiring consistent min assist for balance (deferred further mobility d/t BP concerns)   Stairs  Wheelchair Mobility     Tilt Bed    Modified Rankin (Stroke Patients Only)       Balance Overall balance assessment: Needs assistance Sitting-balance support: No upper extremity supported, Feet supported Sitting  balance-Leahy Scale: Good Sitting balance - Comments: steady reaching within BOS   Standing balance support: Bilateral upper extremity supported Standing balance-Leahy Scale: Fair Standing balance comment: steady static standing with B UE support on RW                            Cognition Arousal: Alert Behavior During Therapy: Flat affect Overall Cognitive Status: Within Functional Limits for tasks assessed (Oriented to person, hospital, date, and general situation (although pt reporting generalized confusion at times))                         Following Commands: Follows multi-step commands with increased time Safety/Judgement: Decreased awareness of safety Awareness: Intellectual Problem Solving: Slow processing, Decreased initiation, Requires verbal cues, Requires tactile cues          Exercises General Exercises - Lower Extremity Ankle Circles/Pumps: AROM, Strengthening, Both, 10 reps, Seated Long Arc Quad: AROM, Strengthening, Both, 10 reps, Seated Hip Flexion/Marching: AROM, Strengthening, Both, 10 reps, Seated    General Comments  Pt's wife present during session.      Pertinent Vitals/Pain Pain Assessment Pain Assessment: Faces Faces Pain Scale: Hurts a little bit Pain Location: R lower ribs Pain Descriptors / Indicators: Aching Pain Intervention(s): Limited activity within patient's tolerance, Monitored during session    Home Living                          Prior Function            PT Goals (current goals can now be found in the care plan section) Acute Rehab PT Goals Patient Stated Goal: To return home safely and without patient being at high risk of falling PT Goal Formulation: With patient/family Time For Goal Achievement: 03/06/23 Progress towards PT goals: Progressing toward goals    Frequency    Min 1X/week      PT Plan      Co-evaluation              AM-PAC PT "6 Clicks" Mobility   Outcome  Measure  Help needed turning from your back to your side while in a flat bed without using bedrails?: None Help needed moving from lying on your back to sitting on the side of a flat bed without using bedrails?: A Little Help needed moving to and from a bed to a chair (including a wheelchair)?: A Little Help needed standing up from a chair using your arms (e.g., wheelchair or bedside chair)?: A Little Help needed to walk in hospital room?: A Little Help needed climbing 3-5 steps with a railing? : A Lot 6 Click Score: 18    End of Session Equipment Utilized During Treatment: Gait belt Activity Tolerance: Patient tolerated treatment well Patient left: in chair;with call bell/phone within reach;with chair alarm set;with family/visitor present Nurse Communication: Mobility status;Precautions;Other (comment) (Pt's BP and symptoms during session) PT Visit Diagnosis: Unsteadiness on feet (R26.81);Repeated falls (R29.6);Difficulty in walking, not elsewhere classified (R26.2)     Time: 3329-5188 PT Time Calculation (min) (ACUTE ONLY): 33 min  Charges:    $Therapeutic Exercise: 8-22 mins $Therapeutic Activity: 8-22 mins PT General Charges $$ ACUTE PT  VISIT: 1 Visit                     Hendricks Limes, PT 02/23/23, 5:28 PM

## 2023-02-23 NOTE — Progress Notes (Signed)
SLP Cancellation Note  Patient Details Name: Nicholas Weiss MRN: [id number] DOB: [birth year]   Cancelled treatment:       Reason Eval/Treat Not Completed: Fatigue/lethargy limiting ability to participate Pt has not been able to sufficient arouse for attention to cognitive activities or participation in ST services. Will sign off at this time.   Rayburn Mundis 02/23/2023, 8:29 AM

## 2023-02-24 DIAGNOSIS — I615 Nontraumatic intracerebral hemorrhage, intraventricular: Secondary | ICD-10-CM | POA: Diagnosis not present

## 2023-02-24 LAB — CBC WITH DIFFERENTIAL/PLATELET
Abs Immature Granulocytes: 0 10*3/uL (ref 0.00–0.07)
Basophils Absolute: 0 10*3/uL (ref 0.0–0.1)
Basophils Relative: 0 %
Eosinophils Absolute: 0.1 10*3/uL (ref 0.0–0.5)
Eosinophils Relative: 3 %
HCT: 25.6 % — ABNORMAL LOW (ref 39.0–52.0)
Hemoglobin: 8.3 g/dL — ABNORMAL LOW (ref 13.0–17.0)
Immature Granulocytes: 0 %
Lymphocytes Relative: 46 %
Lymphs Abs: 1.3 10*3/uL (ref 0.7–4.0)
MCH: 30.5 pg (ref 26.0–34.0)
MCHC: 32.4 g/dL (ref 30.0–36.0)
MCV: 94.1 fL (ref 80.0–100.0)
Monocytes Absolute: 0.4 10*3/uL (ref 0.1–1.0)
Monocytes Relative: 14 %
Neutro Abs: 1 10*3/uL — ABNORMAL LOW (ref 1.7–7.7)
Neutrophils Relative %: 37 %
Platelets: 232 10*3/uL (ref 150–400)
RBC: 2.72 MIL/uL — ABNORMAL LOW (ref 4.22–5.81)
RDW: 15 % (ref 11.5–15.5)
WBC: 2.8 10*3/uL — ABNORMAL LOW (ref 4.0–10.5)
nRBC: 0 % (ref 0.0–0.2)

## 2023-02-24 LAB — BASIC METABOLIC PANEL
Anion gap: 3 — ABNORMAL LOW (ref 5–15)
BUN: 36 mg/dL — ABNORMAL HIGH (ref 8–23)
CO2: 23 mmol/L (ref 22–32)
Calcium: 7.9 mg/dL — ABNORMAL LOW (ref 8.9–10.3)
Chloride: 111 mmol/L (ref 98–111)
Creatinine, Ser: 1.52 mg/dL — ABNORMAL HIGH (ref 0.61–1.24)
GFR, Estimated: 49 mL/min — ABNORMAL LOW (ref >60)
Glucose, Bld: 97 mg/dL (ref 70–99)
Potassium: 3.7 mmol/L (ref 3.5–5.1)
Sodium: 137 mmol/L (ref 135–145)

## 2023-02-24 LAB — GLUCOSE, CAPILLARY
Glucose-Capillary: 146 mg/dL — ABNORMAL HIGH (ref 70–99)
Glucose-Capillary: 188 mg/dL — ABNORMAL HIGH (ref 70–99)

## 2023-02-24 MED ORDER — BISACODYL 5 MG PO TBEC: 5.0000 mg | DELAYED_RELEASE_TABLET | Freq: Every day | ORAL | Status: DC

## 2023-02-24 MED ORDER — TRAZODONE HCL 50 MG PO TABS: 25.0000 mg | ORAL_TABLET | Freq: Every evening | ORAL | Status: DC | PRN

## 2023-02-24 MED ORDER — APIXABAN 5 MG PO TABS: 5.0000 mg | ORAL_TABLET | Freq: Two times a day (BID) | ORAL | Status: DC

## 2023-02-24 MED ORDER — QUETIAPINE FUMARATE 25 MG PO TABS: 25.0000 mg | ORAL_TABLET | Freq: Every evening | ORAL | Status: DC

## 2023-02-24 MED ORDER — MIDODRINE HCL 10 MG PO TABS: 10.0000 mg | ORAL_TABLET | Freq: Three times a day (TID) | ORAL | Status: DC

## 2023-02-24 MED ORDER — FLUDROCORTISONE ACETATE 0.1 MG PO TABS: 0.1000 mg | ORAL_TABLET | Freq: Every day | ORAL | Status: DC

## 2023-02-24 MED ORDER — POLYETHYLENE GLYCOL 3350 17 G PO PACK: 17.0000 g | PACK | Freq: Two times a day (BID) | ORAL | Status: DC

## 2023-02-24 MED ORDER — TRAMADOL HCL 50 MG PO TABS: 50.0000 mg | ORAL_TABLET | Freq: Four times a day (QID) | ORAL | 0 refills | Status: DC | PRN

## 2023-02-24 MED ORDER — VITAMIN D (ERGOCALCIFEROL) 1.25 MG (50000 UNIT) PO CAPS: 50000.0000 [IU] | ORAL_CAPSULE | ORAL | Status: AC

## 2023-02-24 MED ORDER — HYDRALAZINE HCL 25 MG PO TABS: 25.0000 mg | ORAL_TABLET | Freq: Three times a day (TID) | ORAL | Status: DC | PRN

## 2023-02-24 NOTE — Progress Notes (Signed)
Physical Therapy Treatment Patient Details Name: Nicholas Weiss MRN: [id number] DOB: [birth year] Today's Date: 02/24/2023   History of Present Illness [name] is a 72 y.o. African-American male with medical history significant for hypertension, dyslipidemia and type 2 diabetes mellitus, who presented to the emergency room with acute onset of recurrent syncope.  It has been occasionally associated with hypoglycemia.  Few days ago he fell and hit his head yesterday he had a syncopal episode without injuries.  He denied any headache or dizziness or blurred vision.  No paresthesias or focal muscle weakness.  He reported having a R rib fracture from recent fall.    PT Comments  Patient in bed upon arrival, with patient agreeable to PT tx session this date. No pain reported. Supine, patients BP: 125/81. Patient able to move from supine > sit with supervision and use of rails. Patient able to sit EOB without support and no overt LOB, BP stable: 123/83. Patient agreeable to OOB mobility, able to stand with use of RW and CGA (standing BP: 102/66), and ambulate 160 ft with RW and CGA, mild unsteadiness. After ambulation, patient's BP: 94/1, patient remains asymptomatic. Use of B Ted Hose and Abdominal Binder throughout all activities. Nurse present during session to provide medication and informed of readings. Patient is making progress and will continue to benefit from skilled acute PT services. Will continue to follow acutely.      If plan is discharge home, recommend the following: A little help with walking and/or transfers;Direct supervision/assist for medications management;Assistance with cooking/housework;Assist for transportation;Help with stairs or ramp for entrance;A little help with bathing/dressing/bathroom   Can travel by private vehicle        Equipment Recommendations  Rolling walker (2 wheels);Wheelchair (measurements PT);Wheelchair cushion (measurements PT);BSC/3in1     Recommendations for Other Services       Precautions / Restrictions Precautions Precautions: Fall Restrictions Weight Bearing Restrictions: No     Mobility  Bed Mobility Overal bed mobility: Needs Assistance Bed Mobility: Supine to Sit, Sit to Supine     Supine to sit: Used rails, Supervision Sit to supine: Supervision, Used rails   General bed mobility comments: able to complete with supervision and use of rails; BP supine at start of session: 125/81; seated BP after approx 3 minutes seated at EOB: 123/83.    Transfers Overall transfer level: Needs assistance Equipment used: Rolling walker (2 wheels) Transfers: Sit to/from Stand Sit to Stand: Contact guard assist           General transfer comment: able to stand from EOB x two reps throughout session with CGA. Standing BP reading: 102/66    Ambulation/Gait Ambulation/Gait assistance: Contact guard assist, Min assist Gait Distance (Feet): 160 Feet Assistive device: Rolling walker (2 wheels) Gait Pattern/deviations: Step-through pattern, Decreased step length - right, Decreased step length - left       General Gait Details: intermittent mild veering with use of RW; able to ambualte 160 ft this date with CGA throughout. BP after ambulatioN: 94/71. chair follow for safety but not utilized. Patient remains asymptomatic.   Stairs             Wheelchair Mobility     Tilt Bed    Modified Rankin (Stroke Patients Only)       Balance Overall balance assessment: Needs assistance Sitting-balance support: No upper extremity supported, Feet supported Sitting balance-Leahy Scale: Good Sitting balance - Comments: steady reaching within BOS   Standing balance support: Bilateral upper extremity supported,  During functional activity Standing balance-Leahy Scale: Fair Standing balance comment: patietn steady with use of AD; mild unsteadiness without                            Cognition Arousal:  Alert Behavior During Therapy: Flat affect Overall Cognitive Status: Within Functional Limits for tasks assessed Area of Impairment: Safety/judgement                         Safety/Judgement: Decreased awareness of safety              Exercises      General Comments        Pertinent Vitals/Pain Pain Assessment Pain Assessment: No/denies pain    Home Living                          Prior Function            PT Goals (current goals can now be found in the care plan section) Acute Rehab PT Goals PT Goal Formulation: With patient/family Time For Goal Achievement: 03/06/23 Progress towards PT goals: Progressing toward goals    Frequency    Min 1X/week      PT Plan      Co-evaluation PT/OT/SLP Co-Evaluation/Treatment: Yes Reason for Co-Treatment: For patient/therapist safety PT goals addressed during session: Mobility/safety with mobility;Balance;Proper use of DME OT goals addressed during session: ADL's and self-care;Proper use of Adaptive equipment and DME      AM-PAC PT "6 Clicks" Mobility   Outcome Measure  Help needed turning from your back to your side while in a flat bed without using bedrails?: None Help needed moving from lying on your back to sitting on the side of a flat bed without using bedrails?: A Little Help needed moving to and from a bed to a chair (including a wheelchair)?: A Little Help needed standing up from a chair using your arms (e.g., wheelchair or bedside chair)?: A Little Help needed to walk in hospital room?: A Little Help needed climbing 3-5 steps with a railing? : A Lot 6 Click Score: 18    End of Session Equipment Utilized During Treatment: Gait belt Activity Tolerance: Patient tolerated treatment well Patient left: in bed;with bed alarm set;with call bell/phone within reach Nurse Communication: Mobility status;Precautions PT Visit Diagnosis: Unsteadiness on feet (R26.81);Repeated falls  (R29.6);Difficulty in walking, not elsewhere classified (R26.2)     Time: 2956-2130 PT Time Calculation (min) (ACUTE ONLY): 25 min  Charges:    $Gait Training: 8-22 mins PT General Charges $$ ACUTE PT VISIT: 1 Visit                     Howie Ill, PT, DPT 02/24/23 10:44 AM

## 2023-02-24 NOTE — Plan of Care (Signed)
Problem: Education: Goal: Knowledge of disease or condition will improve 02/24/2023 0522 by Dennie Maizes, LPN Outcome: Progressing 02/24/2023 0521 by Dennie Maizes, LPN Outcome: Progressing Goal: Knowledge of secondary prevention will improve (MUST DOCUMENT ALL) 02/24/2023 0522 by Dennie Maizes, LPN Outcome: Progressing 02/24/2023 0521 by Dennie Maizes, LPN Outcome: Progressing Goal: Knowledge of patient specific risk factors will improve Loraine Leriche N/A or DELETE if not current risk factor) 02/24/2023 0522 by Dennie Maizes, LPN Outcome: Progressing 02/24/2023 0521 by Dennie Maizes, LPN Outcome: Progressing   Problem: Intracerebral Hemorrhage Tissue Perfusion: Goal: Complications of Intracerebral Hemorrhage will be minimized 02/24/2023 0522 by Dennie Maizes, LPN Outcome: Progressing 02/24/2023 0521 by Dennie Maizes, LPN Outcome: Progressing   Problem: Coping: Goal: Will verbalize positive feelings about self 02/24/2023 0522 by Dennie Maizes, LPN Outcome: Progressing 02/24/2023 0521 by Dennie Maizes, LPN Outcome: Progressing Goal: Will identify appropriate support needs 02/24/2023 0522 by Dennie Maizes, LPN Outcome: Progressing 02/24/2023 0521 by Dennie Maizes, LPN Outcome: Progressing   Problem: Health Behavior/Discharge Planning: Goal: Ability to manage health-related needs will improve 02/24/2023 0522 by Dennie Maizes, LPN Outcome: Progressing 02/24/2023 0521 by Dennie Maizes, LPN Outcome: Progressing Goal: Goals will be collaboratively established with patient/family 02/24/2023 0522 by Dennie Maizes, LPN Outcome: Progressing 02/24/2023 0521 by Dennie Maizes, LPN Outcome: Progressing   Problem: Self-Care: Goal: Ability to participate in self-care as condition permits will improve 02/24/2023 0522 by Dennie Maizes, LPN Outcome: Progressing 02/24/2023 0521 by Dennie Maizes, LPN Outcome: Progressing Goal: Verbalization of feelings and concerns over difficulty with self-care will improve 02/24/2023 0522 by Dennie Maizes, LPN Outcome: Progressing 02/24/2023 0521 by Dennie Maizes, LPN Outcome: Progressing Goal: Ability to communicate needs accurately will improve 02/24/2023 0522 by Dennie Maizes, LPN Outcome: Progressing 02/24/2023 0521 by Dennie Maizes, LPN Outcome: Progressing   Problem: Nutrition: Goal: Risk of aspiration will decrease 02/24/2023 0522 by Dennie Maizes, LPN Outcome: Progressing 02/24/2023 0521 by Dennie Maizes, LPN Outcome: Progressing Goal: Dietary intake will improve 02/24/2023 0522 by Dennie Maizes, LPN Outcome: Progressing 02/24/2023 0521 by Dennie Maizes, LPN Outcome: Progressing   Problem: Education: Goal: Knowledge of General Education information will improve Description: Including pain rating scale, medication(s)/side effects and non-pharmacologic comfort measures 02/24/2023 0522 by Dennie Maizes, LPN Outcome: Progressing 02/24/2023 0521 by Dennie Maizes, LPN Outcome: Progressing   Problem: Health Behavior/Discharge Planning: Goal: Ability to manage health-related needs will improve 02/24/2023 0522 by Dennie Maizes, LPN Outcome: Progressing 02/24/2023 0521 by Dennie Maizes, LPN Outcome: Progressing   Problem: Clinical Measurements: Goal: Ability to maintain clinical measurements within normal limits will improve 02/24/2023 0522 by Dennie Maizes, LPN Outcome: Progressing 02/24/2023 0521 by Dennie Maizes, LPN Outcome: Progressing Goal: Will remain free from infection 02/24/2023 0522 by Dennie Maizes, LPN Outcome: Progressing 02/24/2023 0521 by Dennie Maizes, LPN Outcome: Progressing Goal: Diagnostic test results will improve 02/24/2023 0522 by Dennie Maizes, LPN Outcome: Progressing 02/24/2023 0521  by Dennie Maizes, LPN Outcome: Progressing Goal: Respiratory complications will improve 02/24/2023 0522 by Dennie Maizes, LPN Outcome: Progressing 02/24/2023 0521 by Dennie Maizes, LPN Outcome: Progressing Goal: Cardiovascular complication will be avoided 02/24/2023 0522 by Dennie Maizes, LPN Outcome: Progressing 02/24/2023 0521 by Dennie Maizes, LPN Outcome: Progressing   Problem: Activity: Goal: Risk for activity intolerance will decrease 02/24/2023 0522 by Dennie Maizes, LPN Outcome: Progressing  02/24/2023 0521 by Dennie Maizes, LPN Outcome: Progressing   Problem: Nutrition: Goal: Adequate nutrition will be maintained 02/24/2023 0522 by Dennie Maizes, LPN Outcome: Progressing 02/24/2023 0521 by Dennie Maizes, LPN Outcome: Progressing   Problem: Coping: Goal: Level of anxiety will decrease 02/24/2023 0522 by Dennie Maizes, LPN Outcome: Progressing 02/24/2023 0521 by Dennie Maizes, LPN Outcome: Progressing   Problem: Elimination: Goal: Will not experience complications related to bowel motility 02/24/2023 0522 by Dennie Maizes, LPN Outcome: Progressing 02/24/2023 0521 by Dennie Maizes, LPN Outcome: Progressing Goal: Will not experience complications related to urinary retention 02/24/2023 0522 by Dennie Maizes, LPN Outcome: Progressing 02/24/2023 0521 by Dennie Maizes, LPN Outcome: Progressing   Problem: Pain Management: Goal: General experience of comfort will improve 02/24/2023 0522 by Dennie Maizes, LPN Outcome: Progressing 02/24/2023 0521 by Dennie Maizes, LPN Outcome: Progressing   Problem: Safety: Goal: Ability to remain free from injury will improve 02/24/2023 0522 by Dennie Maizes, LPN Outcome: Progressing 02/24/2023 0521 by Dennie Maizes, LPN Outcome: Progressing   Problem: Skin Integrity: Goal: Risk for impaired skin integrity will  decrease 02/24/2023 0522 by Dennie Maizes, LPN Outcome: Progressing 02/24/2023 0521 by Dennie Maizes, LPN Outcome: Progressing   Problem: Education: Goal: Ability to describe self-care measures that may prevent or decrease complications (Diabetes Survival Skills Education) will improve 02/24/2023 0522 by Dennie Maizes, LPN Outcome: Progressing 02/24/2023 0521 by Dennie Maizes, LPN Outcome: Progressing Goal: Individualized Educational Video(s) 02/24/2023 0522 by Dennie Maizes, LPN Outcome: Progressing 02/24/2023 0521 by Dennie Maizes, LPN Outcome: Progressing   Problem: Coping: Goal: Ability to adjust to condition or change in health will improve 02/24/2023 0522 by Dennie Maizes, LPN Outcome: Progressing 02/24/2023 0521 by Dennie Maizes, LPN Outcome: Progressing   Problem: Fluid Volume: Goal: Ability to maintain a balanced intake and output will improve 02/24/2023 0522 by Dennie Maizes, LPN Outcome: Progressing 02/24/2023 0521 by Dennie Maizes, LPN Outcome: Progressing   Problem: Health Behavior/Discharge Planning: Goal: Ability to identify and utilize available resources and services will improve 02/24/2023 0522 by Dennie Maizes, LPN Outcome: Progressing 02/24/2023 0521 by Dennie Maizes, LPN Outcome: Progressing Goal: Ability to manage health-related needs will improve 02/24/2023 0522 by Dennie Maizes, LPN Outcome: Progressing 02/24/2023 0521 by Dennie Maizes, LPN Outcome: Progressing   Problem: Metabolic: Goal: Ability to maintain appropriate glucose levels will improve 02/24/2023 0522 by Dennie Maizes, LPN Outcome: Progressing 02/24/2023 0521 by Dennie Maizes, LPN Outcome: Progressing   Problem: Nutritional: Goal: Maintenance of adequate nutrition will improve 02/24/2023 0522 by Dennie Maizes, LPN Outcome: Progressing 02/24/2023 0521 by Dennie Maizes,  LPN Outcome: Progressing Goal: Progress toward achieving an optimal weight will improve 02/24/2023 0522 by Dennie Maizes, LPN Outcome: Progressing 02/24/2023 0521 by Dennie Maizes, LPN Outcome: Progressing   Problem: Skin Integrity: Goal: Risk for impaired skin integrity will decrease 02/24/2023 0522 by Dennie Maizes, LPN Outcome: Progressing 02/24/2023 0521 by Dennie Maizes, LPN Outcome: Progressing   Problem: Tissue Perfusion: Goal: Adequacy of tissue perfusion will improve 02/24/2023 0522 by Dennie Maizes, LPN Outcome: Progressing 02/24/2023 0521 by Dennie Maizes, LPN Outcome: Progressing

## 2023-02-24 NOTE — TOC Transition Note (Addendum)
Transition of Care [facility]) - CM/SW Discharge Note   Patient Details  Name: Nicholas Weiss MRN: [id number] Date of Birth: 1951/03/18  Transition of Care [facility]) CM/SW Contact:  Allena Katz, LCSW Phone Number: 02/24/2023, 3:10 PM   Clinical Narrative:   Pt has orders to discharge to [facility]. RN give number for report. Dc summary to be sent once in. [name] notified she states she will be here around 4 to pick patient up.    Final next level of care: Skilled Nursing Facility Barriers to Discharge: Barriers Resolved   Patient Goals and CMS Choice CMS Medicare.gov Compare Post Acute Care list provided to:: Patient Represenative (must comment) (Wife sharon)    Discharge Placement                Patient chooses bed at: [facility]   Name of family member notified: sharon    Discharge Plan and Services Additional resources added to the After Visit Summary for                            [facility] Arranged: PT HH Agency: [facility] Date [facility] Agency Contacted: 02/16/23 Time HH Agency Contacted: [phone] Representative spoke with at [facility]: [facility]  Social Determinants of Health (SDOH) Interventions SDOH Screenings   Food Insecurity: No Food Insecurity (02/16/2023)  Housing: Low Risk  (02/16/2023)  Transportation Needs: No Transportation Needs (02/16/2023)  Utilities: Not At Risk (02/16/2023)  Alcohol Screen: Low Risk  (10/02/2020)  Depression (PHQ2-9): Medium Risk (10/02/2020)  Financial Resource Strain: Low Risk  (01/26/2023)   Received from [facility]  Physical Activity: Insufficiently Active (01/01/2020)  Social Connections: Moderately Isolated (01/01/2020)  Stress: No Stress Concern Present (01/01/2020)  Tobacco Use: Low Risk  (02/15/2023)  Recent Concern: Tobacco Use - Medium Risk (02/15/2023)   Received from [facility]     Readmission Risk Interventions    02/16/2023    3:56 PM 08/24/2022   10:57 AM  05/20/2022   11:41 AM  Readmission Risk Prevention Plan  Transportation Screening Complete Complete Complete  Medication Review Oceanographer) Complete Complete Complete  PCP or Specialist appointment within 3-5 days of discharge Complete Complete Complete  HRI or Home Care Consult Complete Complete Complete  SW Recovery Care/Counseling Consult Not Complete    Palliative Care Screening Not Applicable  Complete  Skilled Nursing Facility Complete Complete Complete

## 2023-02-24 NOTE — TOC Progression Note (Signed)
Transition of Care [facility]) - Progression Note    Patient Details  Name: Nicholas Weiss MRN: [id number] Date of Birth: 08-06-1950  Transition of Care [facility]) CM/SW Contact  Allena Katz, LCSW Phone Number: 02/24/2023, 2:45 PM  Clinical Narrative:  Pt has auth for [facility] good until 11/27-12/2,     Expected Discharge Plan: Home/Self Care Barriers to Discharge: Continued Medical Work up  Expected Discharge Plan and Services       Living arrangements for the past 2 months: Single Family Home                           HH Arranged: PT [facility] Agency: [facility] Date [facility] Agency Contacted: 02/16/23 Time HH Agency Contacted: 1559 Representative spoke with at [facility]: [facility]   Social Determinants of Health (SDOH) Interventions SDOH Screenings   Food Insecurity: No Food Insecurity (02/16/2023)  Housing: Low Risk  (02/16/2023)  Transportation Needs: No Transportation Needs (02/16/2023)  Utilities: Not At Risk (02/16/2023)  Alcohol Screen: Low Risk  (10/02/2020)  Depression (PHQ2-9): Medium Risk (10/02/2020)  Financial Resource Strain: Low Risk  (01/26/2023)   Received from [facility]  Physical Activity: Insufficiently Active (01/01/2020)  Social Connections: Moderately Isolated (01/01/2020)  Stress: No Stress Concern Present (01/01/2020)  Tobacco Use: Low Risk  (02/15/2023)  Recent Concern: Tobacco Use - Medium Risk (02/15/2023)   Received from [facility]    Readmission Risk Interventions    02/16/2023    3:56 PM 08/24/2022   10:57 AM 05/20/2022   11:41 AM  Readmission Risk Prevention Plan  Transportation Screening Complete Complete Complete  Medication Review Oceanographer) Complete Complete Complete  PCP or Specialist appointment within 3-5 days of discharge Complete Complete Complete  HRI or Home Care Consult Complete Complete Complete  SW Recovery Care/Counseling Consult Not Complete    Palliative Care Screening Not Applicable   Complete  Skilled Nursing Facility Complete Complete Complete

## 2023-02-24 NOTE — Progress Notes (Signed)
[facility] Progress Note  Patient: Nicholas Weiss    ZOX:[id number]  DOA: 02/15/2023     Date of Service: the patient was seen and examined on 02/24/2023  Chief Complaint  Patient presents with   Loss of Consciousness   Brief hospital course: NATHAINEL MORONE is a 72 y.o. African-American male with PMH of paroxysmal A-fib, type 1 diabetes mellitus, dysautonomia/orthostatic hypotension, hypertension, dyslipidemia, CVA, multiple myeloma, myoclonus/asterixis was seen by Neuro and started keppra,  , who presented to the emergency room with acute onset of recurrent syncope.  It has been occasionally associated with hypoglycemia.  Few days ago he fell and hit his head yesterday he had a syncopal episode without injuries.  He denied any headache or dizziness or blurred vision.  No paresthesias or focal muscle weakness.  He reported having a rib fracture from recent fall.  He has been having right-sided chest/rib pain without palpitations.  No cough or wheezing or dyspnea.  No dysuria, oliguria or hematuria or flank pain.  No bleeding diathesis.   ED Course: When he came to the ER, BP was 171/99 with otherwise normal vital signs.  Labs revealed hyponatremia of 129 and hyperglycemia of 413 with a BUN of 39 creatinine 2.17 comparable to previous levels and high sensitive troponin I was 18.  CBC showed mild leukopenia of 3.2 and anemia close to baseline.   EKG as reviewed by me : Normal sinus rhythm with rate of 81 without acute findings.  QTc was 548 MS. Imaging: On 02/12/2023: Rib x-ray showed minimally displaced fracture of the posterolateral right 10th rib with no acute cardiopulmonary disease.   Noncontrasted CT scan and C-spine CT: 1. Small amount of hyperdense hemorrhage in the right frontal horn, which is new from the 02/12/2023 exam, of indeterminate etiology. No hydrocephalus. 2. No acute fracture or traumatic listhesis in the cervical spine.   - Head  and neck CTA:  1. No evidence of  active extravasation in the right frontal horn hemorrhage. 2. No intracranial large vessel occlusion or significant stenosis.  3. 50% stenosis in the proximal bilateral ICAs, just distal to the bifurcations. 4. Severe stenosis at the origin of the right vertebral artery. 5. Multiple dural based enhancing lesions, the largest of which measures up to 9 mm, which correlate with calcified lesions on the prior head CT, most likely small meningiomas, without significant mass effect. 6. Aortic atherosclerosis.   6 hours later repeat head CT scan without contrast showed stable hemorrhage in the anterior horn of the right lateral ventricle.   The patient was given 5 mg oxycodone, 5 units of subcutaneous NovoLog and 1 L bolus of IV normal saline.  Dr. Katrinka Blazing with neurosurgery was notified about the patient.  admitted to a stepdown unit bed for further evaluation and management.   Assessment and Plan:   # Intraventricular hemorrhage, s/p fall/syncope due to orthostatic hypotension Neurosurgery consulted, recommended conservative management Repeat CT head stable. As per neurosurgery no more scan needed, it should be self-limited and no follow-up needed with neurosurgery.  If situation change then we can repeat CT head and he may follow-up with neurosurgery Hold Eliquis for 2 weeks,  11/23 patient had a fall last night and CT head was done which showed worsening of intraventricular bleeding but it was reviewed by neurosurgeon who recommended no change in follow-up as an outpatient for repeat CT scan, no further intervention. Continue fall precautions   # Orthostatic hypotension/dysautonomia Started midodrine 10 mg p.o. 3 times daily  with holding parameters Started Florinef 0.1 mg p.o. daily with holding parameters Monitor BP and titrate medications accordingly  Paroxysmal A-fib Patient was seen by cardiology in the past, aspirin was discontinued and patient was started on Eliquis 5 mg p.o. twice  daily  # Hypertension Avoid low BP due to orthostatics Use hydralazine 25 mg p.o. TID prn if SBP > 160 mmHg Monitor BP and titrate medications accordingly   Uncontrolled diabetes mellitus with hyperglycemia Type 1 diabetes mellitus Continue Semglee 5 units daily, NovoLog sliding scale, monitor CBG Diabetic coordinator consulted Continue diabetic diet  CKD stage IIIb Baseline creatinine 1.7 Cr 2.16--1.9--2.0--1.52 Continue oral hydration and monitor urine output Avoid nephrotoxic medication, use renally dose medications. US renal.  No hydronephrosis, PVR 102 11/23 started NS 100 mL/h for 10 hours to hydrate and recheck renal functions  US renal: Prevoid volume 337 ml. Postvoid volume 102 ml. No hydronephrosis.   Hyponatremia, Resolved  pseudohyponatremia due to hyperglycemia. monitor sodium level daily    Diabetic neuropathy  - continue Neurontin.   GERD without esophagitis - continue PPI therapy.   Symptomatic myoclonus and asterixis, involuntary movement Keppra 500mg  BID for symptomatic myoclonus was started by Neurologist on 01/15/2023  H/o CVA, left subcortical infarct, continue Statin ASA was stopped and pt was started on Eliquis, due to paroxysmal A-fib H/o Multiple myeloma, s/p  chemo, recommended to continue to follow with oncologist as an outpatient   Anemia of chronic disease Iron, folate and B12 within normal range Monitor H&H Transfuse if hemoglobin less than 7  Leukopenia and neutropenia, unknown cause. ANC 2.3--1.0 again neutropenic, WBC 2.8 low today Continue monitor CBC  Vitamin D Insufficiency: started vitamin D 50,000 units p.o. weekly, follow with PCP to repeat vitamin D level after 3 to 6 months.   Body mass index is 19.74 kg/m.  Interventions:  Diet: Carb modified diet DVT Prophylaxis: SCD, pharmacological prophylaxis contraindicated due to ICH    Advance goals of care discussion: Full code  Family Communication: family was not  present at bedside, at the time of interview.  The pt provided permission to discuss medical plan with the family. Opportunity was given to ask question and all questions were answered satisfactorily.  11/21 discussed with patient's friend over the phone 11/23 discussed with patient's friend over the phone   Disposition:  Pt is from Home, admitted with intraventricular hemorrhage, orthostatic hypotension, syncope, still has orthostatic vitals, which is chronic issue, otherwise patient is clinically stable, medically optimized to discharge to SNF  Discharge to SNF, when bed will be available.  TOC is following for disposition plan.   11/26 patient was denied for short-term rehab, family appealed which was faxed by [facility], for final decision  Subjective: No significant overnight events, patient was resting comfortably, denies any dizziness while standing.  Patient did not offer any complaints, stated that he is feeling fine.   Physical Exam: General: NAD, lying comfortably Appear in no distress, affect appropriate Eyes: PERRLA ENT: Oral Mucosa Clear, moist  Neck: no JVD,  Cardiovascular: S1 and S2 Present, no Murmur,  Respiratory: good respiratory effort, Bilateral Air entry equal and Decreased, no Crackles, no wheezes Abdomen: Bowel Sound present, Soft and no tenderness,  Skin: no rashes Extremities: no Pedal edema, no calf tenderness, s/p left first and second toe amputation Neurologic: without any new focal findings Gait not checked due to patient safety concerns  Vitals:   02/24/23 0359 02/24/23 0746 02/24/23 0900 02/24/23 1248  BP: (!) 153/86 (!) 141/88 125/81 Marland Kitchen)  145/98  Pulse: 67 78 81 75  Resp: 18 17    Temp: (!) 97.5 F (36.4 C) 97.9 F (36.6 C)    TempSrc:      SpO2: 100% 100%    Weight:      Height:       No intake or output data in the 24 hours ending 02/24/23 1313  Filed Weights   02/15/23 1713 02/16/23 0545 02/17/23 0044  Weight: 64 kg 65 kg 62.4 kg    Data  Reviewed: I have personally reviewed and interpreted daily labs, tele strips, imagings as discussed above. I reviewed all nursing notes, pharmacy notes, vitals, pertinent old records I have discussed plan of care as described above with RN and patient/family.  CBC: Recent Labs  Lab 02/20/23 0414 02/21/23 0420 02/22/23 0620 02/23/23 0549 02/24/23 0447  WBC 2.7* 3.0* 3.1* 3.9* 2.8*  NEUTROABS 1.5* 1.5* 1.4* 2.3 1.0*  HGB 8.6* 7.6* 8.1* 7.9* 8.3*  HCT 25.7* 23.2* 24.9* 24.4* 25.6*  MCV 91.5 93.5 94.3 92.8 94.1  PLT 241 223 217 228 232   Basic Metabolic Panel: Recent Labs  Lab 02/18/23 0448 02/19/23 0524 02/20/23 0414 02/21/23 0420 02/22/23 0620 02/23/23 0549 02/24/23 0447  NA 134* 136 135 139 139 137 137  K 4.6 4.1 4.4 4.9 4.2 4.3 3.7  CL 104 106 104 107 109 108 111  CO2 21* 22 24 25 24 22 23   GLUCOSE 158* 81 165* 249* 99 301* 97  BUN 51* 46* 42* 42* 36* 35* 36*  CREATININE 2.16* 1.90* 2.00* 1.76* 1.68* 1.63* 1.52*  CALCIUM 8.5* 8.6* 8.4* 8.0* 8.2* 8.0* 7.9*  MG 2.3 2.3 2.2  --  1.8  --   --   PHOS 3.6 4.1 3.5  --  2.9  --   --     Studies: No results found.  Scheduled Meds:   stroke: early stages of recovery book   Does not apply Once   atorvastatin  80 mg Oral QHS   feeding supplement (GLUCERNA SHAKE)  237 mL Oral TID BM   fludrocortisone  0.1 mg Oral Daily   gabapentin  300 mg Oral BID   insulin aspart  0-5 Units Subcutaneous QHS   insulin aspart  0-9 Units Subcutaneous TID WC   insulin glargine-yfgn  5 Units Subcutaneous Daily   levETIRAcetam  500 mg Oral BID   midodrine  10 mg Oral TID WC   multivitamin with minerals  1 tablet Oral Daily   pantoprazole  40 mg Oral QHS   QUEtiapine  25 mg Oral QPM   senna-docusate  1 tablet Oral BID   Vitamin D (Ergocalciferol)  50,000 Units Oral Q7 days   Continuous Infusions:   PRN Meds: acetaminophen **OR** acetaminophen (TYLENOL) oral liquid 160 mg/5 mL **OR** acetaminophen, hydrALAZINE, loperamide, LORazepam,  ondansetron (ZOFRAN) IV, traMADol, traZODone, ziprasidone  Time spent: 40 minutes  Author: Gillis Santa. MD [facility] 02/24/2023 1:13 PM  To reach On-call, see care teams to locate the attending and reach out to them via [email or web address]. If 7PM-7AM, please contact night-coverage If you still have difficulty reaching the attending provider, please page the [facility] (Director on Call) for [facility] on amion for assistance.

## 2023-02-24 NOTE — Progress Notes (Signed)
Called and gave report to Cloyde Reams LPN at [facility]

## 2023-02-24 NOTE — Discharge Summary (Signed)
[facility] Discharge Summary   Patient: Nicholas Weiss JXB:[id number]  PCP: Marisue Ivan, MD  Date of admission: 02/15/2023   Date of discharge:  02/24/2023     Discharge Diagnoses:  Principal Problem:   Intraventricular hemorrhage (HCC) Active Problems:   Uncontrolled diabetes mellitus with hyperglycemia (HCC)   Hyponatremia   Essential hypertension   ICH (intracerebral hemorrhage) (HCC)   GERD without esophagitis   Diabetic neuropathy (HCC)   Admitted From: Home Disposition:  SNF   Recommendations for Outpatient Follow-up:  Follow-up with PCP, patient to be seen by an MD in 1 to 2 days, monitor blood pressure, patient has orthostatic hypotension, started midodrine and Florinef with holding parameters.  Avoid excessive hypotension due to intraventricular hemorrhage.  Repeat CBC and BMP in 1 to 2 weeks Follow with neurosurgery in 2 weeks for repeat CT head and resume Eliquis if patient is cleared from neurosurgery point of view. Follow-up with neurology in 1 to 2 weeks, patient was started on Keppra for myoclonus. Follow up LABS/TEST: CBC and BMP in 1-2 weeks   Contact information for after-discharge care     Destination     [facility] .   Service: Skilled Nursing Contact information: [address] [place] [address] [phone]                    Diet recommendation: Cardiac and Carb modified diet  Activity: The patient is advised to gradually reintroduce usual activities, as tolerated  Discharge Condition: stable  Code Status: Full code   History of present illness: As per the H and P dictated on admission Hospital Course:  Nicholas Weiss is a 72 y.o. African-American male with PMH of paroxysmal A-fib, type 1 diabetes mellitus, dysautonomia/orthostatic hypotension, hypertension, dyslipidemia, CVA, multiple myeloma, myoclonus/asterixis was seen by Neuro and started keppra,  , who presented to the emergency  room with acute onset of recurrent syncope.  It has been occasionally associated with hypoglycemia.  Few days ago he fell and hit his head yesterday he had a syncopal episode without injuries.  He denied any headache or dizziness or blurred vision.  No paresthesias or focal muscle weakness.  He reported having a rib fracture from recent fall.  He has been having right-sided chest/rib pain without palpitations.    ED Course: BP 171/99 with otherwise normal vital signs. BMP: hyponatremia Na 129 and hyperglycemia of 413, BUN 39 cr 2.17  Troponin-I 18.  CBC showed mild leukopenia of 3.2 and anemia close to baseline.   EKG as reviewed by me : Normal sinus rhythm with rate of 81 without acute findings.  QTc was 548 MS. Imaging: On 02/12/2023: Rib x-ray showed minimally displaced fracture of the posterolateral right 10th rib with no acute cardiopulmonary disease.   Noncontrasted CT scan and C-spine CT: 1. Small amount of hyperdense hemorrhage in the right frontal horn, which is new from the 02/12/2023 exam, of indeterminate etiology. No hydrocephalus. 2. No acute fracture or traumatic listhesis in the cervical spine.   - Head  and neck CTA:  1. No evidence of active extravasation in the right frontal horn hemorrhage. 2. No intracranial large vessel occlusion or significant stenosis. 3. 50% stenosis in the proximal bilateral ICAs, just distal to the bifurcations. 4. Severe stenosis at the origin of the right vertebral artery. 5. Multiple dural based enhancing lesions, the largest of which measures up to 9 mm, which correlate with calcified lesions on the prior head CT, most  likely small meningiomas, without significant mass effect. 6. Aortic atherosclerosis.   6 hours later repeat head CT scan without contrast showed stable hemorrhage in the anterior horn of the right lateral ventricle.   The patient was given 5 mg oxycodone, 5 units of subcutaneous NovoLog and 1 L bolus of IV normal saline.  Dr. Katrinka Blazing with  neurosurgery was notified about the patient.  admitted to a stepdown unit bed for further evaluation and management.     Assessment and Plan:   # Intraventricular hemorrhage, s/p fall/syncope due to orthostatic hypotension. Neurosurgery consulted, recommended conservative management Repeat CT head stable. As per neurosurgery no more scan needed, it should be self-limited and no follow-up needed with neurosurgery.  If situation change then we can repeat CT head and he may follow-up with neurosurgery Hold Eliquis for 2 weeks, and resume on 03/02/2023. 11/23 patient had a fall last night and CT head was done which showed worsening of intraventricular bleeding but it was reviewed by neurosurgeon who recommended no change in follow-up as an outpatient for repeat CT scan, no further intervention. Continue fall precautions   # Orthostatic hypotension/dysautonomia Started midodrine 10 mg p.o. 3 times daily with holding parameters Started Florinef 0.1 mg p.o. daily with holding parameters Monitor BP and titrate medications accordingly   # Paroxysmal A-fib: Patient was seen by cardiology in the past, aspirin was discontinued and patient was started on Eliquis 5 mg p.o. twice daily.  Resume Eliquis on 03/02/2023 if no changes in the mental status, no bleeding and no falls.   # Hypertension: Avoid low BP due to orthostatics Use hydralazine 25 mg p.o. TID prn if SBP > 160 mmHg Monitor BP and titrate medications accordingly    # Uncontrolled diabetes mellitus with hyperglycemia Type 1 diabetes mellitus: Resumed home regimen on discharge, monitor CBG, continue diabetic diet and titrate dose accordingly. # CKD stage IIIb: Baseline creatinine 1.7, Cr 2.16--1.9--2.0--1.52 gradually improved with oral hydration and IV fluid.  Repeat BMP after 1 to 2 weeks.  Continue oral hydration. US renal.  No hydronephrosis, PVR 102 US renal: Prevoid volume 337 ml. Postvoid volume 102 ml. No hydronephrosis. # Hyponatremia,  Resolved , pseudohyponatremia due to hyperglycemia. # Diabetic neuropathy: continue Neurontin. # GERD without esophagitis: continue PPI therapy. # Symptomatic myoclonus and asterixis, involuntary movement Keppra 500mg  BID for symptomatic myoclonus was started by Neurologist on 01/15/2023.  Follow-up with neurology in 1 to 2 weeks # H/o CVA, left subcortical infarct, continue Statin ASA was stopped and pt was started on Eliquis, due to paroxysmal A-fib # H/o Multiple myeloma, s/p  chemo, recommended to continue to follow with oncologist as an outpatient  # Anemia of chronic disease: Iron, folate and B12 within normal range # Leukopenia and neutropenia, unknown cause. ANC 2.3--1.0 again neutropenic, WBC 2.8 low today. monitor CBC # Vitamin D Insufficiency: started vitamin D 50,000 units p.o. weekly, follow with PCP to repeat vitamin D level after 3 to 6 months.   Body mass index is 19.74 kg/m.  Nutrition Problem: Severe Malnutrition Etiology: chronic illness (multiple myeloma) Nutrition Interventions: Interventions: Glucerna shake, MVI, Magic cup   Pain control  - [place] Controlled Substance Reporting System database could not be reviewed as website was not working.   - 10 tans tramadol prescribed. - Patient was instructed, not to drive, operate heavy machinery, perform activities at heights, swimming or participation in water activities or provide baby sitting services while on Pain, Sleep and Anxiety Medications; until his outpatient Physician has  advised to do so again.  - Also recommended to not to take more than prescribed Pain, Sleep and Anxiety Medications.  Patient was seen by physical therapy, who recommended Therapy, placement, which was arranged. On the day of the discharge the patient's vitals were stable, and no other acute medical condition were reported by patient. the patient was felt safe to be discharge at [facility].  Consultants: Neurosurgery Procedures:  None  Discharge Exam: General: Appear in no distress, no Rash; Oral Mucosa Clear, moist. Cardiovascular: S1 and S2 Present, no Murmur, Respiratory: normal respiratory effort, Bilateral Air entry present and no Crackles, no wheezes Abdomen: Bowel Sound present, Soft and no tenderness, no hernia Extremities: no Pedal edema, no calf tenderness Neurology: CN grossly intact, no focal deficits.   affect appropriate.  Filed Weights   02/15/23 1713 02/16/23 0545 02/17/23 0044  Weight: 64 kg 65 kg 62.4 kg   Vitals:   02/24/23 0900 02/24/23 1248  BP: 125/81 (!) 145/98  Pulse: 81 75  Resp:    Temp:    SpO2:      DISCHARGE MEDICATION: Allergies as of 02/24/2023       Reactions   Penicillins Other (See Comments)   Childhood allergy -tolerated amoxil 03/2022 Unknown reaction Has patient had a PCN reaction causing immediate rash, facial/tongue/throat swelling, SOB or lightheadedness with hypotension: No Has patient had a PCN reaction causing severe rash involving mucus membranes or skin necrosis: No Has patient had a PCN reaction that required hospitalization No Has patient had a PCN reaction occurring within the last 10 years: No If all of the above answers are "NO", then may proceed with Cephalosporin use.        Medication List     STOP taking these medications    acyclovir 400 MG tablet Commonly known as: ZOVIRAX   aspirin EC 81 MG tablet   Baqsimi One Pack 3 MG/DOSE Powd Generic drug: Glucagon   docusate sodium 100 MG capsule Commonly known as: COLACE   insulin lispro 100 UNIT/ML injection Commonly known as: HUMALOG   lisinopril 2.5 MG tablet Commonly known as: ZESTRIL   pantoprazole 40 MG tablet Commonly known as: Protonix   pomalidomide 3 MG capsule Commonly known as: POMALYST       TAKE these medications    acetaminophen 500 MG tablet Commonly known as: TYLENOL Take 1,000 mg by mouth daily as needed for mild pain or moderate pain.   apixaban 5 MG  Tabs tablet Commonly known as: ELIQUIS Take 1 tablet (5 mg total) by mouth 2 (two) times daily. Start Eliquis after 2 weeks which will be 03/02/2023 Start taking on: March 02, 2023 What changed:  additional instructions These instructions start on March 02, 2023. If you are unsure what to do until then, ask your doctor or other care provider.   ascorbic acid 500 MG tablet Commonly known as: VITAMIN C Take 1 tablet (500 mg total) by mouth daily.   atorvastatin 80 MG tablet Commonly known as: LIPITOR Take 1 tablet (80 mg total) by mouth at bedtime. Hold while taking Paxlovid   bisacodyl 5 MG EC tablet Commonly known as: bisacodyl Take 1 tablet (5 mg total) by mouth at bedtime. Skip the dose if no constipation   fludrocortisone 0.1 MG tablet Commonly known as: FLORINEF Take 1 tablet (0.1 mg total) by mouth daily. Skip the dose if SBP >130 Start taking on: February 25, 2023   gabapentin 300 MG capsule Commonly known as: NEURONTIN Take 300 mg by  mouth 2 (two) times daily.   hydrALAZINE 25 MG tablet Commonly known as: APRESOLINE Take 1 tablet (25 mg total) by mouth 3 (three) times daily as needed (SBP >160). What changed:  when to take this reasons to take this   levETIRAcetam 500 MG tablet Commonly known as: KEPPRA Take 1 tablet (500 mg total) by mouth 2 (two) times daily.   midodrine 10 MG tablet Commonly known as: PROAMATINE Take 1 tablet (10 mg total) by mouth 3 (three) times daily with meals. Hold if SBP >140   multivitamin with minerals Tabs tablet Take 1 tablet by mouth daily.   NovoLOG 100 UNIT/ML injection Generic drug: insulin aspart Sliding Scale: Glucose 70 - 120: 0 units  Glucose 121 - 150: 1 unit  Glucose 151 - 200: 2 units Glucose 201 - 250: 3 units  Glucose 251 - 300: 5 units  Glucose 301 - 350: 7 units  Glucose 351 - 400: 9 units  Glucose > 400: call your docotr   ondansetron 8 MG tablet Commonly known as: ZOFRAN Take 8 mg by mouth daily as  needed for nausea or vomiting.   polyethylene glycol 17 g packet Commonly known as: MiraLax Take 17 g by mouth 2 (two) times daily. Skip the dose if no constipation   QUEtiapine 25 MG tablet Commonly known as: SEROQUEL Take 1 tablet (25 mg total) by mouth every evening.   traMADol 50 MG tablet Commonly known as: Ultram Take 1 tablet (50 mg total) by mouth every 6 (six) hours as needed.   traZODone 50 MG tablet Commonly known as: DESYREL Take 0.5 tablets (25 mg total) by mouth at bedtime as needed for sleep.   Evaristo Bury FlexTouch 100 UNIT/ML FlexTouch Pen Generic drug: insulin degludec Inject 5 Units into the skin daily. Home med.   Vitamin D (Ergocalciferol) 1.25 MG (50000 UNIT) Caps capsule Commonly known as: DRISDOL Take 1 capsule (50,000 Units total) by mouth every 7 (seven) days. Start taking on: February 25, 2023       Allergies  Allergen Reactions   Penicillins Other (See Comments)    Childhood allergy -tolerated amoxil 03/2022 Unknown reaction Has patient had a PCN reaction causing immediate rash, facial/tongue/throat swelling, SOB or lightheadedness with hypotension: No Has patient had a PCN reaction causing severe rash involving mucus membranes or skin necrosis: No Has patient had a PCN reaction that required hospitalization No Has patient had a PCN reaction occurring within the last 10 years: No If all of the above answers are "NO", then may proceed with Cephalosporin use.    Discharge Instructions     Call MD for:  difficulty breathing, headache or visual disturbances   Complete by: As directed    Call MD for:  extreme fatigue   Complete by: As directed    Call MD for:  persistant dizziness or light-headedness   Complete by: As directed    Call MD for:  persistant nausea and vomiting   Complete by: As directed    Call MD for:  severe uncontrolled pain   Complete by: As directed    Call MD for:  temperature >100.4   Complete by: As directed    Diet - low  sodium heart healthy   Complete by: As directed    Diet Carb Modified   Complete by: As directed    Discharge instructions   Complete by: As directed    Follow-up with PCP, patient to be seen by an MD in 1 to 2 days,  monitor blood pressure, patient has orthostatic hypotension, started midodrine and Florinef with holding parameters.  Avoid excessive hypotension due to intraventricular hemorrhage.  Repeat CBC and BMP in 1 to 2 weeks As per neurosurgery hold Eliquis for 2 weeks, no need to follow-up, resume Eliquis on 03/02/2023 unless any trauma or mental status change.  Repeat CT head if any suspicion of rebleeding. Follow-up with neurology in 1 to 2 weeks, patient was started on Keppra for myoclonus.   Increase activity slowly   Complete by: As directed        The results of significant diagnostics from this hospitalization (including imaging, microbiology, ancillary and laboratory) are listed below for reference.    Significant Diagnostic Studies: US RENAL  Result Date: 02/20/2023 CLINICAL DATA:  [id number] CKD (chronic kidney disease) stage 3, GFR 30-59 ml/min (HCC) [id number] EXAM: RENAL / URINARY TRACT ULTRASOUND COMPLETE COMPARISON:  December 01, 2020 FINDINGS: Evaluation is limited by limited acoustic windows and shadowing bowel gas Right Kidney: Renal measurements: 9.3 x 3.7 x 4.5 cm = volume: 82 mL. Echogenicity is slightly increased, similar comparison to prior. No mass or hydronephrosis visualized. Left Kidney: Renal measurements: 9.2 x 5.4 x 5.8 cm = volume: 150 mL. Echogenicity within normal limits. No mass or hydronephrosis visualized. Bladder: Appears normal for degree of bladder distention. Prevoid volume 337 ML. Postvoid volume 102 ML. Other: None. IMPRESSION: 1. No hydronephrosis. 2. Postvoid residual of 102 mL. Electronically Signed   By: Meda Klinefelter M.D.   On: 02/20/2023 11:56   CT HEAD WO CONTRAST ( )  Result Date: 02/20/2023 CLINICAL DATA:  72 year old male status post  fall. Recent right frontal horn intraventricular hemorrhage after a fall. EXAM: CT HEAD WITHOUT CONTRAST TECHNIQUE: Contiguous axial images were obtained from the base of the skull through the vertex without intravenous contrast. RADIATION DOSE REDUCTION: This exam was performed according to the departmental dose-optimization program which includes automated exposure control, adjustment of the mA and/or kV according to patient size and/or use of iterative reconstruction technique. COMPARISON:  Head CT 02/16/2023 and earlier. FINDINGS: Brain: Ongoing right lateral intraventricular hemorrhage, with mild extension since 02/16/2023. Hyperdense blood now surrounds most of the right choroid plexus. Some of this has read distributed from the right frontal horn. But the hemorrhage volume has increased. Minimal extension into the 3rd ventricle (series 2, image 15). But stable ventricle size and configuration. No ventriculomegaly. Left lateral and 4th ventricles remain spared. No transependymal edema. No superimposed midline shift, mass effect, evidence of mass lesion,or evidence of cortically based acute infarction. Patchy bilateral white matter hypodensity is stable. Vascular: No suspicious intracranial vascular hyperdensity. Calcified atherosclerosis at the skull base. Skull: Stable and intact. Sinuses/Orbits: Visualized paranasal sinuses and mastoids are stable and well aerated. Other: Stable orbit and scalp soft tissues. No acute injury identified. IMPRESSION: 1. Increased volume of right lateral intraventricular hemorrhage since 02/16/2023, now moderate. But still the blood is fairly isolated to that ventricle; minimal extension into the 3rd ventricle. No associated ventriculomegaly or transependymal edema. 2. Underlying chronic small vessel disease. No other acute intracranial abnormality. Electronically Signed   By: Odessa Fleming M.D.   On: 02/20/2023 05:04   CT HEAD WO CONTRAST ( )  Result Date: 02/16/2023 CLINICAL  DATA:  Follow-up hemorrhage EXAM: CT HEAD WITHOUT CONTRAST TECHNIQUE: Contiguous axial images were obtained from the base of the skull through the vertex without intravenous contrast. RADIATION DOSE REDUCTION: This exam was performed according to the departmental dose-optimization program which includes automated exposure control, adjustment of  the mA and/or kV according to patient size and/or use of iterative reconstruction technique. COMPARISON:  02/15/2023 FINDINGS: Brain: Again seen is the blood within the anterior horn of the right lateral ventricle, unchanged. No new areas of hemorrhage. No hydrocephalus. No acute infarct. No mass effect or midline shift. Vascular: No hyperdense vessel or unexpected calcification. Skull: No acute calvarial abnormality. Sinuses/Orbits: No acute findings Other: None IMPRESSION: Stable hemorrhage in the anterior horn of the right lateral ventricle. Electronically Signed   By: Charlett Nose M.D.   On: 02/16/2023 03:49   CT ANGIO HEAD NECK W WO CM  Result Date: 02/16/2023 CLINICAL DATA:  Hemorrhage in the right frontal horn on CT head EXAM: CT ANGIOGRAPHY HEAD AND NECK WITH AND WITHOUT CONTRAST TECHNIQUE: Multidetector CT imaging of the head and neck was performed using the standard protocol during bolus administration of intravenous contrast. Multiplanar CT image reconstructions and MIPs were obtained to evaluate the vascular anatomy. Carotid stenosis measurements (when applicable) are obtained utilizing NASCET criteria, using the distal internal carotid diameter as the denominator. RADIATION DOSE REDUCTION: This exam was performed according to the departmental dose-optimization program which includes automated exposure control, adjustment of the mA and/or kV according to patient size and/or use of iterative reconstruction technique. CONTRAST:  75mL OMNIPAQUE IOHEXOL 350 MG/ML SOLN COMPARISON:  No prior CTA available, correlation is made with 08/10/2021 MRA head and neck and  02/05/2023 CT head FINDINGS: CT HEAD FINDINGS For noncontrast findings, please see same day CT head. No evidence of active extravasation in right frontal horn hemorrhage. Multiple dural based enhancing lesions, the largest of which measures up to 9 mm (series 7, image 69 and 107), with smaller lesions along the left cerebral convexity (series 7, images 86, 96, and 98) and falx (series 7, image 74, for example), which correlate with calcified lesions on the prior head CT, most likely small meningiomas, without significant mass effect. CTA NECK FINDINGS Aortic arch: Standard branching. Imaged portion shows no evidence of aneurysm or dissection. No significant stenosis of the major arch vessel origins. Mild aortic atherosclerosis. Right carotid system: 50% stenosis in the proximal right ICA, just distal to the bifurcation (series 7, image 221). No evidence of dissection. Left carotid system: 50% stenosis in the proximal left ICA, just distal to the bifurcation (series 7, image 210). No evidence of dissection. Vertebral arteries: Severe stenosis at the origin of right vertebral artery (series 9, image 62). The right vertebral artery is otherwise patent to the skull base. The left vertebral artery is patent from its origin to the skull base without significant stenosis. No evidence of dissection. Skeleton: No acute osseous abnormality. Degenerative changes in the cervical spine. Edentulous with calcifications but Other neck: No acute finding. Upper chest: No focal pulmonary opacity or pleural effusion. Review of the MIP images confirms the above findings CTA HEAD FINDINGS Anterior circulation: Both internal carotid arteries are patent to the termini, without significant stenosis. A1 segments patent, hypoplastic on the right. Normal anterior communicating artery. Anterior cerebral arteries are patent to their distal aspects without significant stenosis. No M1 stenosis or occlusion. MCA branches perfused to their distal  aspects without significant stenosis. Posterior circulation: The bilateral V4 segments primarily supply the PICAs, with diminutive distal V4 segments patent to vertebrobasilar junction. Basilar patent to its distal aspect without significant stenosis. Superior cerebellar arteries patent proximally. Patent, hypoplastic P1 segments. Near fetal origin of the bilateral PCAs from the posterior communicating arteries. PCAs perfused to their distal aspects without significant stenosis.  Venous sinuses: As permitted by contrast timing, patent. Anatomic variants: Near fetal origin of the bilateral PCAs. No evidence of aneurysm or vascular malformation. Review of the MIP images confirms the above findings IMPRESSION: 1. No evidence of active extravasation in the right frontal horn hemorrhage. 2. No intracranial large vessel occlusion or significant stenosis. 3. 50% stenosis in the proximal bilateral ICAs, just distal to the bifurcations. 4. Severe stenosis at the origin of the right vertebral artery. 5. Multiple dural based enhancing lesions, the largest of which measures up to 9 mm, which correlate with calcified lesions on the prior head CT, most likely small meningiomas, without significant mass effect. 6. Aortic atherosclerosis. Aortic Atherosclerosis (ICD10-I70.0). Electronically Signed   By: Wiliam Ke M.D.   On: 02/16/2023 00:35   CT HEAD WO CONTRAST ( )  Result Date: 02/15/2023 CLINICAL DATA:  Recent fall, with subsequent syncopal event, headache and neck pain EXAM: CT HEAD WITHOUT CONTRAST CT CERVICAL SPINE WITHOUT CONTRAST TECHNIQUE: Multidetector CT imaging of the head and cervical spine was performed following the standard protocol without intravenous contrast. Multiplanar CT image reconstructions of the cervical spine were also generated. RADIATION DOSE REDUCTION: This exam was performed according to the departmental dose-optimization program which includes automated exposure control, adjustment of the  mA and/or kV according to patient size and/or use of iterative reconstruction technique. COMPARISON:  02/12/2023 CT head and cervical spine FINDINGS: CT HEAD FINDINGS Brain: Small amount of hyperdense hemorrhage in the right frontal horn (series 2, image 11), which is new from the 02/12/2023 exam, of indeterminate etiology. No hydrocephalus. No evidence of parenchymal hemorrhage, subdural hemorrhage, or subarachnoid hemorrhage. No acute infarct, mass, mass effect, or midline shift. Vascular: No hyperdense vessel. Skull: Negative for fracture or focal lesion. Sinuses/Orbits: No acute finding. Other: The mastoid air cells are well aerated. CT CERVICAL SPINE FINDINGS Alignment: No traumatic listhesis. Straightening and mild reversal of the normal cervical lordosis. Trace anterolisthesis of C3 on C4 and trace retrolisthesis of C5 on C6 and C6 on C7. Skull base and vertebrae: No acute fracture or suspicious osseous lesion. Soft tissues and spinal canal: No prevertebral fluid or swelling. No visible canal hematoma. Disc levels: Degenerative changes in the cervical spine.No high-grade spinal canal stenosis. Upper chest: For findings in the thorax, please see same day CT chest. IMPRESSION: 1. Small amount of hyperdense hemorrhage in the right frontal horn, which is new from the 02/12/2023 exam, of indeterminate etiology. No hydrocephalus. 2. No acute fracture or traumatic listhesis in the cervical spine. These results were called by telephone at the time of interpretation on 02/15/2023 at 10:07 pm to provider [facility] , who verbally acknowledged these results. Electronically Signed   By: Wiliam Ke M.D.   On: 02/15/2023 22:07   CT Cervical Spine Wo Contrast  Result Date: 02/15/2023 CLINICAL DATA:  Recent fall, with subsequent syncopal event, headache and neck pain EXAM: CT HEAD WITHOUT CONTRAST CT CERVICAL SPINE WITHOUT CONTRAST TECHNIQUE: Multidetector CT imaging of the head and cervical spine was performed  following the standard protocol without intravenous contrast. Multiplanar CT image reconstructions of the cervical spine were also generated. RADIATION DOSE REDUCTION: This exam was performed according to the departmental dose-optimization program which includes automated exposure control, adjustment of the mA and/or kV according to patient size and/or use of iterative reconstruction technique. COMPARISON:  02/12/2023 CT head and cervical spine FINDINGS: CT HEAD FINDINGS Brain: Small amount of hyperdense hemorrhage in the right frontal horn (series 2, image 11), which is new  from the 02/12/2023 exam, of indeterminate etiology. No hydrocephalus. No evidence of parenchymal hemorrhage, subdural hemorrhage, or subarachnoid hemorrhage. No acute infarct, mass, mass effect, or midline shift. Vascular: No hyperdense vessel. Skull: Negative for fracture or focal lesion. Sinuses/Orbits: No acute finding. Other: The mastoid air cells are well aerated. CT CERVICAL SPINE FINDINGS Alignment: No traumatic listhesis. Straightening and mild reversal of the normal cervical lordosis. Trace anterolisthesis of C3 on C4 and trace retrolisthesis of C5 on C6 and C6 on C7. Skull base and vertebrae: No acute fracture or suspicious osseous lesion. Soft tissues and spinal canal: No prevertebral fluid or swelling. No visible canal hematoma. Disc levels: Degenerative changes in the cervical spine.No high-grade spinal canal stenosis. Upper chest: For findings in the thorax, please see same day CT chest. IMPRESSION: 1. Small amount of hyperdense hemorrhage in the right frontal horn, which is new from the 02/12/2023 exam, of indeterminate etiology. No hydrocephalus. 2. No acute fracture or traumatic listhesis in the cervical spine. These results were called by telephone at the time of interpretation on 02/15/2023 at 10:07 pm to provider [facility] , who verbally acknowledged these results. Electronically Signed   By: Wiliam Ke M.D.   On:  02/15/2023 22:07   CT Chest Wo Contrast  Result Date: 02/15/2023 CLINICAL DATA:  Chest trauma and pain. EXAM: CT CHEST WITHOUT CONTRAST TECHNIQUE: Multidetector CT imaging of the chest was performed following the standard protocol without IV contrast. RADIATION DOSE REDUCTION: This exam was performed according to the departmental dose-optimization program which includes automated exposure control, adjustment of the mA and/or kV according to patient size and/or use of iterative reconstruction technique. COMPARISON:  Chest CT dated 02/17/2022. FINDINGS: Evaluation of this exam is limited in the absence of intravenous contrast. Cardiovascular: There is no cardiomegaly. Small pericardial effusion. Advanced 3 vessel coronary vascular calcification. Mild atherosclerotic calcification of the thoracic aorta. No aneurysmal dilatation. The central pulmonary arteries are grossly unremarkable on this noncontrast CT. Mediastinum/Nodes: No hilar or mediastinal adenopathy. The esophagus is grossly unremarkable. No mediastinal fluid collection. Lungs/Pleura: Bibasilar linear atelectasis/scarring. Small left lower lobe subpleural calcified granuloma. No focal consolidation, pleural effusion or pneumothorax. The central airways are patent. Upper Abdomen: A 3 mm nonobstructing right renal upper pole calculus. Probable small cyst in the dome of the liver. Musculoskeletal: Osteopenia. Old compression fracture of superior endplate of L1. No acute osseous pathology. Acute fractures of the lateral right 8-10th ribs. IMPRESSION: 1. Acute fractures of the lateral right 8-10th ribs. No pneumothorax. 2. Advanced coronary vascular calcification. 3. A 3 mm nonobstructing right renal upper pole calculus. 4.  Aortic Atherosclerosis (ICD10-I70.0). Electronically Signed   By: Elgie Collard M.D.   On: 02/15/2023 22:01   DG Ribs Unilateral W/Chest Right  Result Date: 02/12/2023 CLINICAL DATA:  Fall. EXAM: RIGHT RIBS AND CHEST - 3+ VIEW  COMPARISON:  09/18/2022. FINDINGS: Bilateral lung fields are clear. Bilateral costophrenic angles are clear. Normal cardio-mediastinal silhouette. There is acute minimally displaced fracture of the posterolateral right tenth rib. No other acute displaced rib fracture. The soft tissues are within normal limits. IMPRESSION: *There is a minimally displaced fracture of the posterolateral right tenth rib. Otherwise no acute cardiopulmonary abnormality Electronically Signed   By: Jules Schick M.D.   On: 02/12/2023 16:25   CT Head Wo Contrast  Result Date: 02/12/2023 CLINICAL DATA:  Head trauma, minor (Age >= 65y); Neck trauma (Age >= 65y) EXAM: CT HEAD WITHOUT CONTRAST CT CERVICAL SPINE WITHOUT CONTRAST TECHNIQUE: Multidetector CT imaging of  the head and cervical spine was performed following the standard protocol without intravenous contrast. Multiplanar CT image reconstructions of the cervical spine were also generated. RADIATION DOSE REDUCTION: This exam was performed according to the departmental dose-optimization program which includes automated exposure control, adjustment of the mA and/or kV according to patient size and/or use of iterative reconstruction technique. COMPARISON:  None Available. FINDINGS: CT HEAD FINDINGS Brain: No evidence of acute infarction, hemorrhage, hydrocephalus, extra-axial collection or mass lesion/mass effect. Vascular: No hyperdense vessel. Skull: No acute fracture. Sinuses/Orbits: Clear sinuses.  No acute orbital finding. Other: No mastoid effusions. CT CERVICAL SPINE FINDINGS Alignment: Mild, likely degenerative retrolisthesis at C5-C6 and C6-C7. Otherwise, no substantial sagittal subluxation. Skull base and vertebrae: No evidence of acute fracture. Vertebral body heights are maintained. Soft tissues and spinal canal: No prevertebral fluid or swelling. No visible canal hematoma. Disc levels: Moderate multilevel degenerative change, greatest at C5-C6 and C6-C7. Upper chest:  Visualized lung apices are clear. IMPRESSION: No evidence of acute abnormality intracranially or in the cervical spine. Electronically Signed   By: Feliberto Harts M.D.   On: 02/12/2023 12:56   CT Cervical Spine Wo Contrast  Result Date: 02/12/2023 CLINICAL DATA:  Head trauma, minor (Age >= 65y); Neck trauma (Age >= 65y) EXAM: CT HEAD WITHOUT CONTRAST CT CERVICAL SPINE WITHOUT CONTRAST TECHNIQUE: Multidetector CT imaging of the head and cervical spine was performed following the standard protocol without intravenous contrast. Multiplanar CT image reconstructions of the cervical spine were also generated. RADIATION DOSE REDUCTION: This exam was performed according to the departmental dose-optimization program which includes automated exposure control, adjustment of the mA and/or kV according to patient size and/or use of iterative reconstruction technique. COMPARISON:  None Available. FINDINGS: CT HEAD FINDINGS Brain: No evidence of acute infarction, hemorrhage, hydrocephalus, extra-axial collection or mass lesion/mass effect. Vascular: No hyperdense vessel. Skull: No acute fracture. Sinuses/Orbits: Clear sinuses.  No acute orbital finding. Other: No mastoid effusions. CT CERVICAL SPINE FINDINGS Alignment: Mild, likely degenerative retrolisthesis at C5-C6 and C6-C7. Otherwise, no substantial sagittal subluxation. Skull base and vertebrae: No evidence of acute fracture. Vertebral body heights are maintained. Soft tissues and spinal canal: No prevertebral fluid or swelling. No visible canal hematoma. Disc levels: Moderate multilevel degenerative change, greatest at C5-C6 and C6-C7. Upper chest: Visualized lung apices are clear. IMPRESSION: No evidence of acute abnormality intracranially or in the cervical spine. Electronically Signed   By: Feliberto Harts M.D.   On: 02/12/2023 12:56    Microbiology: Recent Results (from the past 240 hour(s))  MRSA Next Gen by PCR, Nasal     Status: Abnormal   Collection  Time: 02/16/23  5:41 AM   Specimen: Nasal Mucosa; Nasal Swab  Result Value Ref Range Status   MRSA by PCR Next Gen DETECTED (A) NOT DETECTED Final    Comment: RESULT CALLED TO, READ BACK BY AND VERIFIED WITH: Rowe Pavy 02/16/23 0849 MW (NOTE) The GeneXpert MRSA Assay (FDA approved for NASAL specimens only), is one component of a comprehensive MRSA colonization surveillance program. It is not intended to diagnose MRSA infection nor to guide or monitor treatment for MRSA infections. Test performance is not FDA approved in patients less than 65 years old. Performed at [facility], [address] [place], [place] [address]      Labs: CBC: Recent Labs  Lab 02/20/23 0414 02/21/23 0420 02/22/23 0620 02/23/23 0549 02/24/23 0447  WBC 2.7* 3.0* 3.1* 3.9* 2.8*  NEUTROABS 1.5* 1.5* 1.4* 2.3 1.0*  HGB 8.6*  7.6* 8.1* 7.9* 8.3*  HCT 25.7* 23.2* 24.9* 24.4* 25.6*  MCV 91.5 93.5 94.3 92.8 94.1  PLT 241 223 217 228 232   Basic Metabolic Panel: Recent Labs  Lab 02/18/23 0448 02/19/23 0524 02/20/23 0414 02/21/23 0420 02/22/23 0620 02/23/23 0549 02/24/23 0447  NA 134* 136 135 139 139 137 137  K 4.6 4.1 4.4 4.9 4.2 4.3 3.7  CL 104 106 104 107 109 108 111  CO2 21* 22 24 25 24 22 23   GLUCOSE 158* 81 165* 249* 99 301* 97  BUN 51* 46* 42* 42* 36* 35* 36*  CREATININE 2.16* 1.90* 2.00* 1.76* 1.68* 1.63* 1.52*  CALCIUM 8.5* 8.6* 8.4* 8.0* 8.2* 8.0* 7.9*  MG 2.3 2.3 2.2  --  1.8  --   --   PHOS 3.6 4.1 3.5  --  2.9  --   --    Liver Function Tests: No results for input(s): "AST", "ALT", "ALKPHOS", "BILITOT", "PROT", "ALBUMIN" in the last 168 hours. No results for input(s): "LIPASE", "AMYLASE" in the last 168 hours. No results for input(s): "AMMONIA" in the last 168 hours. Cardiac Enzymes: No results for input(s): "CKTOTAL", "CKMB", "CKMBINDEX", "TROPONINI" in the last 168 hours. BNP (last 3 results) No results for input(s): "BNP" in the last 8760 hours. CBG: Recent  Labs  Lab 02/23/23 1226 02/23/23 1651 02/23/23 2022 02/24/23 0748 02/24/23 1233  GLUCAP 179* 183* 177* 146* 188*    Time spent: 35 minutes  Signed:  Gillis Santa  [facility] 02/24/2023 3:29 PM

## 2023-02-24 NOTE — Progress Notes (Signed)
Occupational Therapy Treatment Patient Details Name: Nicholas Weiss MRN: [id number] DOB: 04-14-1950 Today's Date: 02/24/2023   History of present illness Nicholas Weiss is a 72 y.o. African-American male with medical history significant for hypertension, dyslipidemia and type 2 diabetes mellitus, who presented to the emergency room with acute onset of recurrent syncope.  It has been occasionally associated with hypoglycemia.  Few days ago he fell and hit his head yesterday he had a syncopal episode without injuries.  He denied any headache or dizziness or blurred vision.  No paresthesias or focal muscle weakness.  He reported having a R rib fracture from recent fall.   OT comments  Pt received semi-reclined in bed. Appearing tired, but willing to work with OT on functional mobility and grooming at sink. T/f supervision with RW. Walked a lap around the unit; no significant orthostasis today. Chair follow provided for safety, but not needed this session. See flowsheet below for further details of session. Left supine with all needs in reach.  Patient will benefit from continued OT while in acute care.       If plan is discharge home, recommend the following:  A little help with walking and/or transfers;A little help with bathing/dressing/bathroom;Direct supervision/assist for medications management;Direct supervision/assist for financial management;Assist for transportation;Help with stairs or ramp for entrance   Equipment Recommendations  Other (comment) (defer)    Recommendations for Other Services      Precautions / Restrictions Precautions Precautions: Fall Precaution Comments: watch orthostatics Restrictions Weight Bearing Restrictions: No       Mobility Bed Mobility Overal bed mobility: Needs Assistance Bed Mobility: Supine to Sit, Sit to Supine     Supine to sit: Used rails, Supervision Sit to supine: Supervision, Used rails   General bed mobility comments: able to  complete with supervision and use of rails; BP supine at start of session: 125/81; seated BP after approx 3 minutes seated at EOB: 123/83.    Transfers Overall transfer level: Needs assistance Equipment used: Rolling walker (2 wheels) Transfers: Sit to/from Stand Sit to Stand: Contact guard assist           General transfer comment: able to stand from EOB x two reps throughout session with CGA. Standing BP reading: 102/66     Balance Overall balance assessment: Needs assistance Sitting-balance support: No upper extremity supported, Feet supported Sitting balance-Leahy Scale: Good Sitting balance - Comments: steady reaching within BOS   Standing balance support: Bilateral upper extremity supported, During functional activity Standing balance-Leahy Scale: Fair Standing balance comment: pt steady with use of AD; mild unsteadiness without                           ADL either performed or assessed with clinical judgement   ADL Overall ADL's : Needs assistance/impaired Eating/Feeding: Set up;Sitting   Grooming: Wash/dry hands;Supervision/safety;Standing Grooming Details (indicate cue type and reason): standing at sink; good standing balance with bimanual activity               Lower Body Dressing Details (indicate cue type and reason): Pt declining to don shoes today; was able to lean forward while seated EOB and touch toes; likely able to perform LB dressing without assist. Would likely need assist with compression hose.               General ADL Comments: Pt declines other ADLs today    Extremity/Trunk Assessment Upper Extremity Assessment Upper Extremity Assessment: Overall [facility]  for tasks assessed   Lower Extremity Assessment Lower Extremity Assessment: Defer to PT evaluation        Vision       Perception     Praxis      Cognition Arousal: Alert Behavior During Therapy: Flat affect Overall Cognitive Status: Within Functional Limits for  tasks assessed Area of Impairment: Safety/judgement                               General Comments: Decreased awareness of safety; refusing recliner follow (OT carried chair without pt knowledge 2/2 safety with concerns for hypotension during mobility)        Exercises      Shoulder Instructions       General Comments No significant orthostasis today    Pertinent Vitals/ Pain       Pain Assessment Pain Assessment: No/denies pain  Home Living                                          Prior Functioning/Environment              Frequency  Min 1X/week        Progress Toward Goals  OT Goals(current goals can now be found in the care plan section)  Progress towards OT goals: Progressing toward goals  Acute Rehab OT Goals Patient Stated Goal: Return to PLOF OT Goal Formulation: With patient Time For Goal Achievement: 03/03/23 Potential to Achieve Goals: Good ADL Goals Pt Will Perform Grooming: with modified independence;standing Pt Will Perform Lower Body Dressing: with modified independence;sit to/from stand Pt Will Transfer to Toilet: with modified independence;ambulating;regular height toilet Pt Will Perform Toileting - Clothing Manipulation and hygiene: with modified independence;sitting/lateral leans  Plan      Co-evaluation    PT/OT/SLP Co-Evaluation/Treatment: Yes Reason for Co-Treatment: For patient/therapist safety PT goals addressed during session: Mobility/safety with mobility;Balance;Proper use of DME OT goals addressed during session: ADL's and self-care;Proper use of Adaptive equipment and DME      AM-PAC OT "6 Clicks" Daily Activity     Outcome Measure   Help from another person eating meals?: None Help from another person taking care of personal grooming?: A Little Help from another person toileting, which includes using toliet, bedpan, or urinal?: A Little Help from another person bathing (including washing,  rinsing, drying)?: A Little Help from another person to put on and taking off regular upper body clothing?: None Help from another person to put on and taking off regular lower body clothing?: A Little 6 Click Score: 20    End of Session Equipment Utilized During Treatment: Gait belt;Rolling walker (2 wheels)  OT Visit Diagnosis: Other abnormalities of gait and mobility (R26.89);Repeated falls (R29.6)   Activity Tolerance Patient tolerated treatment well   Patient Left in bed;with call bell/phone within reach;with bed alarm set   Nurse Communication Mobility status        Time: 3244-0102 OT Time Calculation (min): 40 min  Charges: OT General Charges $OT Visit: 1 Visit OT Treatments $Therapeutic Activity: 8-22 mins  Linward Foster, MS, OTR/L  Alvester Morin 02/24/2023, 1:51 PM

## 2023-02-24 NOTE — TOC Progression Note (Signed)
Transition of Care [facility]) - Progression Note    Patient Details  Name: Nicholas Weiss MRN: [id number] Date of Birth: [birth year]  [facility]) CM/SW Contact  Allena Katz, LCSW Phone Number: 02/24/2023, 8:49 AM  Clinical Narrative:   Additional clinical uploaded to [facility].     Expected Discharge Plan: Home/Self Care Barriers to Discharge: Continued Medical Work up  Expected Discharge Plan and Services       Living arrangements for the past 2 months: Single Family Home                           HH Arranged: PT HH Agency: [facility] Date HH Agency Contacted: 02/16/23 Time HH Agency Contacted: 1559 Representative spoke with at [facility] Agency: [facility]   Social Determinants of Health (SDOH) Interventions SDOH Screenings   Food Insecurity: No Food Insecurity (02/16/2023)  Housing: Low Risk  (02/16/2023)  Transportation Needs: No Transportation Needs (02/16/2023)  Utilities: Not At Risk (02/16/2023)  Alcohol Screen: Low Risk  (10/02/2020)  Depression (PHQ2-9): Medium Risk (10/02/2020)  Financial Resource Strain: Low Risk  (01/26/2023)   Received from [facility]  Physical Activity: Insufficiently Active (01/01/2020)  Social Connections: Moderately Isolated (01/01/2020)  Stress: No Stress Concern Present (01/01/2020)  Tobacco Use: Low Risk  (02/15/2023)  Recent Concern: Tobacco Use - Medium Risk (02/15/2023)   Received from [facility]    Readmission Risk Interventions    02/16/2023    3:56 PM 08/24/2022   10:57 AM 05/20/2022   11:41 AM  Readmission Risk Prevention Plan  Transportation Screening Complete Complete Complete  Medication Review Oceanographer) Complete Complete Complete  PCP or Specialist appointment within 3-5 days of discharge Complete Complete Complete  HRI or Home Care Consult Complete Complete Complete  SW Recovery Care/Counseling Consult Not Complete    Palliative Care Screening Not Applicable   Complete  Skilled Nursing Facility Complete Complete Complete

## 2023-02-24 NOTE — Plan of Care (Signed)
Problem: Education: Goal: Knowledge of disease or condition will improve Outcome: Progressing Goal: Knowledge of secondary prevention will improve (MUST DOCUMENT ALL) Outcome: Progressing Goal: Knowledge of patient specific risk factors will improve Loraine Leriche N/A or DELETE if not current risk factor) Outcome: Progressing   Problem: Intracerebral Hemorrhage Tissue Perfusion: Goal: Complications of Intracerebral Hemorrhage will be minimized Outcome: Progressing   Problem: Coping: Goal: Will verbalize positive feelings about self Outcome: Progressing Goal: Will identify appropriate support needs Outcome: Progressing   Problem: Health Behavior/Discharge Planning: Goal: Ability to manage health-related needs will improve Outcome: Progressing Goal: Goals will be collaboratively established with patient/family Outcome: Progressing   Problem: Self-Care: Goal: Ability to participate in self-care as condition permits will improve Outcome: Progressing Goal: Verbalization of feelings and concerns over difficulty with self-care will improve Outcome: Progressing Goal: Ability to communicate needs accurately will improve Outcome: Progressing   Problem: Nutrition: Goal: Risk of aspiration will decrease Outcome: Progressing Goal: Dietary intake will improve Outcome: Progressing   Problem: Education: Goal: Knowledge of General Education information will improve Description: Including pain rating scale, medication(s)/side effects and non-pharmacologic comfort measures Outcome: Progressing   Problem: Health Behavior/Discharge Planning: Goal: Ability to manage health-related needs will improve Outcome: Progressing   Problem: Clinical Measurements: Goal: Ability to maintain clinical measurements within normal limits will improve Outcome: Progressing Goal: Will remain free from infection Outcome: Progressing Goal: Diagnostic test results will improve Outcome: Progressing Goal:  Respiratory complications will improve Outcome: Progressing Goal: Cardiovascular complication will be avoided Outcome: Progressing   Problem: Activity: Goal: Risk for activity intolerance will decrease Outcome: Progressing   Problem: Nutrition: Goal: Adequate nutrition will be maintained Outcome: Progressing   Problem: Coping: Goal: Level of anxiety will decrease Outcome: Progressing   Problem: Elimination: Goal: Will not experience complications related to bowel motility Outcome: Progressing Goal: Will not experience complications related to urinary retention Outcome: Progressing   Problem: Pain Management: Goal: General experience of comfort will improve Outcome: Progressing   Problem: Safety: Goal: Ability to remain free from injury will improve Outcome: Progressing   Problem: Skin Integrity: Goal: Risk for impaired skin integrity will decrease Outcome: Progressing   Problem: Education: Goal: Ability to describe self-care measures that may prevent or decrease complications (Diabetes Survival Skills Education) will improve Outcome: Progressing Goal: Individualized Educational Video(s) Outcome: Progressing   Problem: Coping: Goal: Ability to adjust to condition or change in health will improve Outcome: Progressing   Problem: Fluid Volume: Goal: Ability to maintain a balanced intake and output will improve Outcome: Progressing   Problem: Health Behavior/Discharge Planning: Goal: Ability to identify and utilize available resources and services will improve Outcome: Progressing Goal: Ability to manage health-related needs will improve Outcome: Progressing   Problem: Metabolic: Goal: Ability to maintain appropriate glucose levels will improve Outcome: Progressing   Problem: Nutritional: Goal: Maintenance of adequate nutrition will improve Outcome: Progressing Goal: Progress toward achieving an optimal weight will improve Outcome: Progressing   Problem:  Skin Integrity: Goal: Risk for impaired skin integrity will decrease Outcome: Progressing   Problem: Tissue Perfusion: Goal: Adequacy of tissue perfusion will improve Outcome: Progressing

## 2023-03-01 ENCOUNTER — Ambulatory Visit: Payer: 59

## 2023-03-01 ENCOUNTER — Ambulatory Visit: Payer: 59 | Admitting: Oncology

## 2023-03-01 ENCOUNTER — Other Ambulatory Visit: Payer: 59

## 2023-03-05 ENCOUNTER — Telehealth: Payer: Self-pay | Admitting: *Deleted

## 2023-03-05 ENCOUNTER — Encounter: Payer: Self-pay | Admitting: Oncology

## 2023-03-05 ENCOUNTER — Inpatient Hospital Stay: Payer: 59 | Attending: Oncology

## 2023-03-05 ENCOUNTER — Inpatient Hospital Stay: Payer: 59

## 2023-03-05 ENCOUNTER — Inpatient Hospital Stay: Payer: 59 | Admitting: Oncology

## 2023-03-05 NOTE — Telephone Encounter (Signed)
[facility] called to ask if patient Pomalyst is still on hold and would like a return call to let them know [phone]. I see that he has an appointment today

## 2023-03-08 ENCOUNTER — Encounter: Payer: Self-pay | Admitting: Oncology

## 2023-03-08 ENCOUNTER — Inpatient Hospital Stay
Admission: EM | Admit: 2023-03-08 | Discharge: 2023-03-19 | DRG: 637 | Disposition: A | Discharge status: Skilled Nursing Facility | Payer: 59 | Source: Skilled Nursing Facility | Attending: Student | Admitting: Student

## 2023-03-08 ENCOUNTER — Emergency Department: Payer: 59

## 2023-03-08 ENCOUNTER — Telehealth: Payer: Self-pay | Admitting: *Deleted

## 2023-03-08 ENCOUNTER — Other Ambulatory Visit: Payer: Self-pay

## 2023-03-08 DIAGNOSIS — R4182 Altered mental status, unspecified: Secondary | ICD-10-CM | POA: Diagnosis present

## 2023-03-08 DIAGNOSIS — Z681 Body mass index (BMI) 19 or less, adult: Secondary | ICD-10-CM

## 2023-03-08 DIAGNOSIS — E860 Dehydration: Secondary | ICD-10-CM | POA: Diagnosis present

## 2023-03-08 DIAGNOSIS — D638 Anemia in other chronic diseases classified elsewhere: Secondary | ICD-10-CM | POA: Diagnosis present

## 2023-03-08 DIAGNOSIS — Z794 Long term (current) use of insulin: Secondary | ICD-10-CM | POA: Diagnosis not present

## 2023-03-08 DIAGNOSIS — Z8679 Personal history of other diseases of the circulatory system: Secondary | ICD-10-CM

## 2023-03-08 DIAGNOSIS — E875 Hyperkalemia: Secondary | ICD-10-CM | POA: Diagnosis present

## 2023-03-08 DIAGNOSIS — E1010 Type 1 diabetes mellitus with ketoacidosis without coma: Principal | ICD-10-CM | POA: Diagnosis present

## 2023-03-08 DIAGNOSIS — Z89412 Acquired absence of left great toe: Secondary | ICD-10-CM

## 2023-03-08 DIAGNOSIS — C9000 Multiple myeloma not having achieved remission: Secondary | ICD-10-CM | POA: Diagnosis present

## 2023-03-08 DIAGNOSIS — G253 Myoclonus: Secondary | ICD-10-CM | POA: Diagnosis present

## 2023-03-08 DIAGNOSIS — Z9181 History of falling: Secondary | ICD-10-CM

## 2023-03-08 DIAGNOSIS — I951 Orthostatic hypotension: Secondary | ICD-10-CM | POA: Diagnosis present

## 2023-03-08 DIAGNOSIS — I129 Hypertensive chronic kidney disease with stage 1 through stage 4 chronic kidney disease, or unspecified chronic kidney disease: Secondary | ICD-10-CM | POA: Diagnosis present

## 2023-03-08 DIAGNOSIS — G9341 Metabolic encephalopathy: Secondary | ICD-10-CM | POA: Diagnosis present

## 2023-03-08 DIAGNOSIS — D631 Anemia in chronic kidney disease: Secondary | ICD-10-CM | POA: Diagnosis present

## 2023-03-08 DIAGNOSIS — N1832 Chronic kidney disease, stage 3b: Secondary | ICD-10-CM | POA: Diagnosis present

## 2023-03-08 DIAGNOSIS — Z8249 Family history of ischemic heart disease and other diseases of the circulatory system: Secondary | ICD-10-CM

## 2023-03-08 DIAGNOSIS — E7800 Pure hypercholesterolemia, unspecified: Secondary | ICD-10-CM | POA: Diagnosis present

## 2023-03-08 DIAGNOSIS — E1022 Type 1 diabetes mellitus with diabetic chronic kidney disease: Secondary | ICD-10-CM | POA: Diagnosis present

## 2023-03-08 DIAGNOSIS — I214 Non-ST elevation (NSTEMI) myocardial infarction: Secondary | ICD-10-CM | POA: Diagnosis present

## 2023-03-08 DIAGNOSIS — G901 Familial dysautonomia [Riley-Day]: Secondary | ICD-10-CM | POA: Diagnosis present

## 2023-03-08 DIAGNOSIS — Z880 Allergy status to penicillin: Secondary | ICD-10-CM

## 2023-03-08 DIAGNOSIS — N179 Acute kidney failure, unspecified: Secondary | ICD-10-CM | POA: Diagnosis present

## 2023-03-08 DIAGNOSIS — I4891 Unspecified atrial fibrillation: Secondary | ICD-10-CM

## 2023-03-08 DIAGNOSIS — D509 Iron deficiency anemia, unspecified: Secondary | ICD-10-CM | POA: Diagnosis present

## 2023-03-08 DIAGNOSIS — R630 Anorexia: Secondary | ICD-10-CM | POA: Diagnosis present

## 2023-03-08 DIAGNOSIS — I440 Atrioventricular block, first degree: Secondary | ICD-10-CM | POA: Diagnosis not present

## 2023-03-08 DIAGNOSIS — N183 Chronic kidney disease, stage 3 unspecified: Secondary | ICD-10-CM | POA: Diagnosis present

## 2023-03-08 DIAGNOSIS — Z8673 Personal history of transient ischemic attack (TIA), and cerebral infarction without residual deficits: Secondary | ICD-10-CM | POA: Diagnosis not present

## 2023-03-08 DIAGNOSIS — I480 Paroxysmal atrial fibrillation: Secondary | ICD-10-CM | POA: Diagnosis present

## 2023-03-08 DIAGNOSIS — Z7901 Long term (current) use of anticoagulants: Secondary | ICD-10-CM

## 2023-03-08 DIAGNOSIS — E10649 Type 1 diabetes mellitus with hypoglycemia without coma: Secondary | ICD-10-CM | POA: Diagnosis not present

## 2023-03-08 DIAGNOSIS — Z7952 Long term (current) use of systemic steroids: Secondary | ICD-10-CM

## 2023-03-08 DIAGNOSIS — R7989 Other specified abnormal findings of blood chemistry: Secondary | ICD-10-CM

## 2023-03-08 DIAGNOSIS — Z79899 Other long term (current) drug therapy: Secondary | ICD-10-CM

## 2023-03-08 DIAGNOSIS — Z7189 Other specified counseling: Secondary | ICD-10-CM | POA: Diagnosis not present

## 2023-03-08 DIAGNOSIS — I10 Essential (primary) hypertension: Secondary | ICD-10-CM | POA: Diagnosis present

## 2023-03-08 LAB — BASIC METABOLIC PANEL
Anion gap: 22 — ABNORMAL HIGH (ref 5–15)
Anion gap: 19 — ABNORMAL HIGH (ref 5–15)
BUN: 69 mg/dL — ABNORMAL HIGH (ref 8–23)
BUN: 72 mg/dL — ABNORMAL HIGH (ref 8–23)
CO2: 13 mmol/L — ABNORMAL LOW (ref 22–32)
CO2: 18 mmol/L — ABNORMAL LOW (ref 22–32)
Calcium: 8.4 mg/dL — ABNORMAL LOW (ref 8.9–10.3)
Calcium: 8.7 mg/dL — ABNORMAL LOW (ref 8.9–10.3)
Chloride: 102 mmol/L (ref 98–111)
Chloride: 102 mmol/L (ref 98–111)
Creatinine, Ser: 4.24 mg/dL — ABNORMAL HIGH (ref 0.61–1.24)
Creatinine, Ser: 4.11 mg/dL — ABNORMAL HIGH (ref 0.61–1.24)
GFR, Estimated: 14 mL/min — ABNORMAL LOW (ref >60)
GFR, Estimated: 15 mL/min — ABNORMAL LOW (ref >60)
Glucose, Bld: 493 mg/dL — ABNORMAL HIGH (ref 70–99)
Glucose, Bld: 413 mg/dL — ABNORMAL HIGH (ref 70–99)
Potassium: 4.5 mmol/L (ref 3.5–5.1)
Potassium: 4.5 mmol/L (ref 3.5–5.1)
Sodium: 137 mmol/L (ref 135–145)
Sodium: 139 mmol/L (ref 135–145)

## 2023-03-08 LAB — URINALYSIS, ROUTINE W REFLEX MICROSCOPIC
Bilirubin Urine: NEGATIVE
Glucose, UA: >=500 mg/dL — AB
Hgb urine dipstick: NEGATIVE
Ketones, ur: 20 mg/dL — AB
Leukocytes,Ua: NEGATIVE
Nitrite: NEGATIVE
Protein, ur: 30 mg/dL — AB
Specific Gravity, Urine: 1.016 (ref 1.005–1.030)
pH: 5 (ref 5.0–8.0)

## 2023-03-08 LAB — CBC
HCT: 26.5 % — ABNORMAL LOW (ref 39.0–52.0)
Hemoglobin: 8.2 g/dL — ABNORMAL LOW (ref 13.0–17.0)
MCH: 30.8 pg (ref 26.0–34.0)
MCHC: 30.9 g/dL (ref 30.0–36.0)
MCV: 99.6 fL (ref 80.0–100.0)
Platelets: 186 10*3/uL (ref 150–400)
RBC: 2.66 MIL/uL — ABNORMAL LOW (ref 4.22–5.81)
RDW: 16.5 % — ABNORMAL HIGH (ref 11.5–15.5)
WBC: 8.2 10*3/uL (ref 4.0–10.5)
nRBC: 0 % (ref 0.0–0.2)

## 2023-03-08 LAB — HEPATIC FUNCTION PANEL
ALT: 26 U/L (ref 0–44)
AST: 31 U/L (ref 15–41)
Albumin: 3.8 g/dL (ref 3.5–5.0)
Alkaline Phosphatase: 89 U/L (ref 38–126)
Bilirubin, Direct: <0.1 mg/dL (ref 0.0–0.2)
Total Bilirubin: 1.2 mg/dL — ABNORMAL HIGH (ref <1.2)
Total Protein: 9 g/dL — ABNORMAL HIGH (ref 6.5–8.1)

## 2023-03-08 LAB — BLOOD GAS, VENOUS
Acid-base deficit: 7.5 mmol/L — ABNORMAL HIGH (ref 0.0–2.0)
Bicarbonate: 18.2 mmol/L — ABNORMAL LOW (ref 20.0–28.0)
O2 Saturation: 62.6 %
Patient temperature: 37
pCO2, Ven: 37 mm[Hg] — ABNORMAL LOW (ref 44–60)
pH, Ven: 7.3 (ref 7.25–7.43)
pO2, Ven: 39 mm[Hg] (ref 32–45)

## 2023-03-08 LAB — TROPONIN I (HIGH SENSITIVITY): Troponin I (High Sensitivity): 438 ng/L (ref <18)

## 2023-03-08 LAB — CBG MONITORING, ED: Glucose-Capillary: 454 mg/dL — ABNORMAL HIGH (ref 70–99)

## 2023-03-08 MED ORDER — DEXTROSE 50 % IV SOLN
0.0000 mL | INTRAVENOUS | Status: DC | PRN
  Administered 2023-03-10 (×4): 50 mL via INTRAVENOUS
  Filled 2023-03-08 (×4): qty 50

## 2023-03-08 MED ORDER — INSULIN REGULAR(HUMAN) IN NACL 100-0.9 UT/100ML-% IV SOLN
INTRAVENOUS | Status: DC
  Administered 2023-03-08: 5.5 [IU]/h via INTRAVENOUS
  Filled 2023-03-08: qty 100

## 2023-03-08 MED ORDER — DEXTROSE-SODIUM CHLORIDE 5-0.45 % IV SOLN: INTRAVENOUS | Status: DC

## 2023-03-08 MED ORDER — SODIUM CHLORIDE 0.9 % IV BOLUS
20.0000 mL/kg | Freq: Once | INTRAVENOUS | Status: AC
  Administered 2023-03-08: 1248 mL via INTRAVENOUS

## 2023-03-08 MED ORDER — POTASSIUM CHLORIDE 10 MEQ/100ML IV SOLN
10.0000 meq | INTRAVENOUS | Status: AC
  Administered 2023-03-08 – 2023-03-09 (×2): 10 meq via INTRAVENOUS
  Filled 2023-03-08 (×2): qty 100

## 2023-03-08 MED ORDER — ASPIRIN 300 MG RE SUPP
300.0000 mg | Freq: Once | RECTAL | Status: AC
  Administered 2023-03-08: 300 mg via RECTAL
  Filled 2023-03-08: qty 1

## 2023-03-08 MED ORDER — SODIUM CHLORIDE 0.9 % IV SOLN: INTRAVENOUS | Status: AC

## 2023-03-08 MED ORDER — HEPARIN (PORCINE) 25000 UT/250ML-% IV SOLN: 800.0000 [IU]/h | INTRAVENOUS | Status: DC

## 2023-03-08 MED ORDER — ASPIRIN 81 MG PO CHEW: 324.0000 mg | CHEWABLE_TABLET | Freq: Once | ORAL | Status: DC

## 2023-03-08 NOTE — Progress Notes (Signed)
ANTICOAGULATION CONSULT NOTE  Pharmacy Consult for heparin infusion Indication: ACS/STEMI  Allergies  Allergen Reactions   Penicillins Other (See Comments)    Childhood allergy -tolerated amoxil 03/2022 Unknown reaction Has patient had a PCN reaction causing immediate rash, facial/tongue/throat swelling, SOB or lightheadedness with hypotension: No Has patient had a PCN reaction causing severe rash involving mucus membranes or skin necrosis: No Has patient had a PCN reaction that required hospitalization No Has patient had a PCN reaction occurring within the last 10 years: No If all of the above answers are "NO", then may proceed with Cephalosporin use.     Patient Measurements: Weight: 62.4 kg (137 lb 9.1 oz) (From D/C Summary 02/24/23) Heparin Dosing Weight: 62.4 kg  Vital Signs: Temp: 98.1 F (36.7 C) (12/09 2120) Temp Source: Oral (12/09 2120) BP: 135/71 (12/09 2120) Pulse Rate: 97 (12/09 2120)  Labs: Recent Labs    03/08/23 2126  HGB 8.2*  HCT 26.5*  PLT 186  CREATININE 4.24*  TROPONINIHS 438*    Estimated Creatinine Clearance: 14.5 mL/min (A) (by C-G formula based on SCr of 4.24 mg/dL (H)).   Medical History: Past Medical History:  Diagnosis Date   Acute metabolic encephalopathy 12/02/2020   DKA (diabetic ketoacidoses) 04/06/2016   Hypercholesteremia    Hypertension     Medications:  PTA Meds: Eliquis 5 mg BID, last dose unknown  Assessment: Pt is a 72 yo male w/ hx of brain bleed about 2 weeks ago presenting to ED d/t AMS & hyperglycemia, found with elevated Troponin I level, trending up.  Goal of Therapy:  Heparin level 0.3-0.7 units/ml aPTT 66-102 seconds Monitor platelets by anticoagulation protocol: Yes   Plan:  No bolus, d/t recent brain bleed and last dose of Eliquis unknown. Start heparin infusion at 800 units/hr Will follow aPTT until correlation w/ HL confirmed Will check aPTT in 8 hr after start of infusion HL & CBC daily while on  heparin  Otelia Sergeant, PharmD, [facility] 03/08/2023 11:26 PM

## 2023-03-08 NOTE — Telephone Encounter (Signed)
Call returned to pharmacy and advised that she will send prescription to them when she is ready to restart medicine possibly on 12/20

## 2023-03-08 NOTE — ED Triage Notes (Addendum)
EMS brings pt in from [facility]; staff st AMS since Friday; recently admitted for multiple rib fx and intracranial hemorrhage

## 2023-03-08 NOTE — Telephone Encounter (Signed)
Yes until I see him on 12/20

## 2023-03-08 NOTE — Assessment & Plan Note (Signed)
Continue Keppra.

## 2023-03-08 NOTE — Telephone Encounter (Signed)
Pharmacy calling again asking if Pomalyst is still on hold. Please advise

## 2023-03-08 NOTE — Telephone Encounter (Signed)
Called [facility] and spoke to [place] about the pt. That needs to come on 12/20 at 10 am. Crystal will send the appt to [facility] and he will be here at that date

## 2023-03-08 NOTE — ED Triage Notes (Addendum)
Pt to ED via [facility] from [facility]. Per EMS pt has been altered since Friday and has high blood glucose readings. Pt has hx of brain bleed about 2 weeks ago. Pt A&Ox4 at this time and answering questions appropriately. Pt has hx of diabetes. CBG 454 in triage

## 2023-03-08 NOTE — ED Provider Notes (Signed)
[facility] Provider Note    Event Date/Time   First MD Initiated Contact with Patient 03/08/23 2234     (approximate)   History   Chief Complaint Hyperglycemia and Altered Mental Status   HPI  Nicholas Weiss is a 72 y.o. male with past medical history of hypertension, hyperlipidemia, diabetes, CKD, stroke, and multiple myeloma who presents to the ED for altered mental status.  History is limited as patient is not sure why he is here, reports feeling tired but denies any other complaints.  Per partner at bedside, patient has been not acting like himself for the past 3 days with increased confusion and fatigue.  He was noted to have high blood glucose readings earlier this evening and EMS was called.  EMS reports that patient was recently admitted to the hospital for head bleed following a fall 2 weeks ago.     Physical Exam   Triage Vital Signs: ED Triage Vitals  Encounter Vitals Group     BP 03/08/23 2120 135/71     Systolic BP Percentile --      Diastolic BP Percentile --      Pulse Rate 03/08/23 2120 97     Resp 03/08/23 2120 18     Temp 03/08/23 2120 98.1 F (36.7 C)     Temp Source 03/08/23 2120 Oral     SpO2 03/08/23 2116 97 %     Weight --      Height --      Head Circumference --      Peak Flow --      Pain Score 03/08/23 2118 0     Pain Loc --      Pain Education --      Exclude from Growth Chart --     Most recent vital signs: Vitals:   03/08/23 2116 03/08/23 2120  BP:  135/71  Pulse:  97  Resp:  18  Temp:  98.1 F (36.7 C)  SpO2: 97% 97%    Constitutional: Somnolent but arousable to voice, oriented to person and place, but not time or situation. Eyes: Conjunctivae are normal. Head: Atraumatic. Nose: No congestion/rhinnorhea. Mouth/Throat: Mucous membranes are dry.  Cardiovascular: Normal rate, regular rhythm. Grossly normal heart sounds.  2+ radial pulses bilaterally. Respiratory: Normal respiratory effort.  No  retractions. Lungs CTAB. Gastrointestinal: Soft and nontender. No distention. Musculoskeletal: No lower extremity tenderness nor edema.  Neurologic:  Normal speech and language. No gross focal neurologic deficits are appreciated.    ED Results / Procedures / Treatments   Labs (all labs ordered are listed, but only abnormal results are displayed) Labs Reviewed  BASIC METABOLIC PANEL - Abnormal; Notable for the following components:      Result Value   CO2 13 (*)    Glucose, Bld 493 (*)    BUN 69 (*)    Creatinine, Ser 4.24 (*)    Calcium 8.4 (*)    GFR, Estimated 14 (*)    Anion gap 22 (*)    All other components within normal limits  CBC - Abnormal; Notable for the following components:   RBC 2.66 (*)    Hemoglobin 8.2 (*)    HCT 26.5 (*)    RDW 16.5 (*)    All other components within normal limits  URINALYSIS, ROUTINE W REFLEX MICROSCOPIC - Abnormal; Notable for the following components:   Color, Urine YELLOW (*)    APPearance HAZY (*)    Glucose, UA >=500 (*)  Ketones, ur 20 (*)    Protein, ur 30 (*)    Bacteria, UA FEW (*)    All other components within normal limits  BASIC METABOLIC PANEL - Abnormal; Notable for the following components:   CO2 18 (*)    Glucose, Bld 413 (*)    BUN 72 (*)    Creatinine, Ser 4.11 (*)    Calcium 8.7 (*)    GFR, Estimated 15 (*)    Anion gap 19 (*)    All other components within normal limits  BLOOD GAS, VENOUS - Abnormal; Notable for the following components:   pCO2, Ven 37 (*)    Bicarbonate 18.2 (*)    Acid-base deficit 7.5 (*)    All other components within normal limits  HEPATIC FUNCTION PANEL - Abnormal; Notable for the following components:   Total Protein 9.0 (*)    Total Bilirubin 1.2 (*)    All other components within normal limits  CBG MONITORING, ED - Abnormal; Notable for the following components:   Glucose-Capillary 454 (*)    All other components within normal limits  TROPONIN I (HIGH SENSITIVITY) - Abnormal;  Notable for the following components:   Troponin I (High Sensitivity) 438 (*)    All other components within normal limits  TROPONIN I (HIGH SENSITIVITY) - Abnormal; Notable for the following components:   Troponin I (High Sensitivity) 506 (*)    All other components within normal limits  BASIC METABOLIC PANEL  BASIC METABOLIC PANEL  BASIC METABOLIC PANEL  BETA-HYDROXYBUTYRIC ACID  BETA-HYDROXYBUTYRIC ACID  HEPARIN LEVEL (UNFRACTIONATED)  APTT  PROTIME-INR  BASIC METABOLIC PANEL  BETA-HYDROXYBUTYRIC ACID  CBG MONITORING, ED  CBG MONITORING, ED  CBG MONITORING, ED     EKG  ED ECG REPORT I, Chesley Noon, the attending physician, personally viewed and interpreted this ECG.   Date: 03/08/2023  EKG Time: 21:34  Rate: 99  Rhythm: normal sinus rhythm  Axis: Normal  Intervals:first-degree A-V block   ST&T Change: Nonspecific ST/T abnormality  RADIOLOGY CT head reviewed and interpreted by me with no hemorrhage or midline shift.  PROCEDURES:  Critical Care performed: Yes, see critical care procedure note(s)  .Critical Care  Performed by: Chesley Noon, MD Authorized by: Chesley Noon, MD   Critical care provider statement:    Critical care time (minutes):  30   Critical care time was exclusive of:  Separately billable procedures and treating other patients and teaching time   Critical care was necessary to treat or prevent imminent or life-threatening deterioration of the following conditions:  Endocrine crisis, metabolic crisis and cardiac failure   Critical care was time spent personally by me on the following activities:  Development of treatment plan with patient or surrogate, discussions with consultants, evaluation of patient's response to treatment, examination of patient, ordering and review of laboratory studies, ordering and review of radiographic studies, ordering and performing treatments and interventions, pulse oximetry, re-evaluation of patient's condition  and review of old charts   I assumed direction of critical care for this patient from another provider in my specialty: no     Care discussed with: admitting provider      MEDICATIONS ORDERED IN ED: Medications  insulin regular, human (MYXREDLIN) 100 units/ 100 mL infusion (5.5 Units/hr Intravenous New Bag/Given 03/08/23 2350)  dextrose 50 % solution 0-50 mL (has no administration in time range)  potassium chloride 10 mEq in 100 mL IVPB (10 mEq Intravenous New Bag/Given 03/08/23 2357)  0.9 %  sodium chloride infusion (  has no administration in time range)  dextrose 5 % and 0.45 % NaCl infusion (has no administration in time range)  sodium chloride 0.9 % bolus 1,248 mL (1,248 mLs Intravenous New Bag/Given 03/08/23 2353)  aspirin suppository 300 mg (300 mg Rectal Given 03/08/23 2355)     IMPRESSION / MDM / ASSESSMENT AND PLAN / ED COURSE  I reviewed the triage vital signs and the nursing notes.                              72 y.o. male with past medical history of hypertension, hyperlipidemia, diabetes, CKD, stroke, and multiple myeloma who presents to the ED with worsening mental status over the past 3 days.  Patient's presentation is most consistent with acute presentation with potential threat to life or bodily function.  Differential diagnosis includes, but is not limited to, intracranial hemorrhage, stroke, TIA, electrolyte abnormality, AKI, DKA, UTI.  Patient chronically ill but nontoxic-appearing and in no acute distress, vital signs are unremarkable and do not appear concerning for sepsis.  Patient is somnolent but arousable to voice, does appear disoriented but with no focal neurologic deficits on exam.  CT head is negative for acute process, labs are concerning for DKA with significant hyperglycemia, increased anion gap, and acidosis.  Patient also with significant AKI, we will hydrate with IV fluids and started on IV insulin drip.  Anemia stable compared to previous, no significant  leukocytosis noted, LFTs are unremarkable.  Patient does have significant elevation in troponin at 438, which could be strain related to his DKA or AKI, especially given patient denies any chest pain.  We will give loading dose of aspirin and case discussed with Dr. Para March of the hospitalist service.  After further discussion regarding risks and benefits, we will hold off on heparin drip for now unless patient to have significant increase in his repeat troponin or develop symptoms concerning for ACS.      FINAL CLINICAL IMPRESSION(S) / ED DIAGNOSES   Final diagnoses:  Diabetic ketoacidosis without coma associated with type 1 diabetes mellitus (HCC)  AKI (acute kidney injury) (HCC)  NSTEMI (non-ST elevated myocardial infarction) (HCC)     Rx / DC Orders   ED Discharge Orders     None        Note:  This document was prepared using Dragon voice recognition software and may include unintentional dictation errors.   Chesley Noon, MD 03/09/23 [phone]

## 2023-03-09 ENCOUNTER — Inpatient Hospital Stay: Payer: 59

## 2023-03-09 ENCOUNTER — Other Ambulatory Visit: Payer: Self-pay

## 2023-03-09 DIAGNOSIS — E1010 Type 1 diabetes mellitus with ketoacidosis without coma: Secondary | ICD-10-CM | POA: Diagnosis not present

## 2023-03-09 DIAGNOSIS — I214 Non-ST elevation (NSTEMI) myocardial infarction: Secondary | ICD-10-CM

## 2023-03-09 LAB — CBG MONITORING, ED
Glucose-Capillary: 193 mg/dL — ABNORMAL HIGH (ref 70–99)
Glucose-Capillary: 307 mg/dL — ABNORMAL HIGH (ref 70–99)
Glucose-Capillary: 380 mg/dL — ABNORMAL HIGH (ref 70–99)
Glucose-Capillary: 120 mg/dL — ABNORMAL HIGH (ref 70–99)
Glucose-Capillary: 138 mg/dL — ABNORMAL HIGH (ref 70–99)
Glucose-Capillary: 142 mg/dL — ABNORMAL HIGH (ref 70–99)
Glucose-Capillary: 147 mg/dL — ABNORMAL HIGH (ref 70–99)
Glucose-Capillary: 127 mg/dL — ABNORMAL HIGH (ref 70–99)

## 2023-03-09 LAB — BASIC METABOLIC PANEL
Anion gap: 7 (ref 5–15)
Anion gap: 8 (ref 5–15)
Anion gap: 13 (ref 5–15)
BUN: 68 mg/dL — ABNORMAL HIGH (ref 8–23)
BUN: 66 mg/dL — ABNORMAL HIGH (ref 8–23)
BUN: 67 mg/dL — ABNORMAL HIGH (ref 8–23)
CO2: 25 mmol/L (ref 22–32)
CO2: 24 mmol/L (ref 22–32)
CO2: 18 mmol/L — ABNORMAL LOW (ref 22–32)
Calcium: 8.3 mg/dL — ABNORMAL LOW (ref 8.9–10.3)
Calcium: 8.3 mg/dL — ABNORMAL LOW (ref 8.9–10.3)
Calcium: 8.1 mg/dL — ABNORMAL LOW (ref 8.9–10.3)
Chloride: 110 mmol/L (ref 98–111)
Chloride: 109 mmol/L (ref 98–111)
Chloride: 108 mmol/L (ref 98–111)
Creatinine, Ser: 3.6 mg/dL — ABNORMAL HIGH (ref 0.61–1.24)
Creatinine, Ser: 3.55 mg/dL — ABNORMAL HIGH (ref 0.61–1.24)
Creatinine, Ser: 3.15 mg/dL — ABNORMAL HIGH (ref 0.61–1.24)
GFR, Estimated: 18 mL/min — ABNORMAL LOW (ref >60)
GFR, Estimated: 18 mL/min — ABNORMAL LOW (ref >60)
GFR, Estimated: 21 mL/min — ABNORMAL LOW (ref >60)
Glucose, Bld: 134 mg/dL — ABNORMAL HIGH (ref 70–99)
Glucose, Bld: 162 mg/dL — ABNORMAL HIGH (ref 70–99)
Glucose, Bld: 499 mg/dL — ABNORMAL HIGH (ref 70–99)
Potassium: 3.8 mmol/L (ref 3.5–5.1)
Potassium: 4.2 mmol/L (ref 3.5–5.1)
Potassium: 5.4 mmol/L — ABNORMAL HIGH (ref 3.5–5.1)
Sodium: 142 mmol/L (ref 135–145)
Sodium: 141 mmol/L (ref 135–145)
Sodium: 139 mmol/L (ref 135–145)

## 2023-03-09 LAB — HEPARIN LEVEL (UNFRACTIONATED): Heparin Unfractionated: >1.1 [IU]/mL — ABNORMAL HIGH (ref 0.30–0.70)

## 2023-03-09 LAB — APTT
aPTT: 29 s (ref 24–36)
aPTT: 121 s — ABNORMAL HIGH (ref 24–36)

## 2023-03-09 LAB — GLUCOSE, CAPILLARY
Glucose-Capillary: 472 mg/dL — ABNORMAL HIGH (ref 70–99)
Glucose-Capillary: 275 mg/dL — ABNORMAL HIGH (ref 70–99)
Glucose-Capillary: 195 mg/dL — ABNORMAL HIGH (ref 70–99)

## 2023-03-09 LAB — BETA-HYDROXYBUTYRIC ACID
Beta-Hydroxybutyric Acid: 4.42 mmol/L — ABNORMAL HIGH (ref 0.05–0.27)
Beta-Hydroxybutyric Acid: 0.31 mmol/L — ABNORMAL HIGH (ref 0.05–0.27)
Beta-Hydroxybutyric Acid: 6.75 mmol/L — ABNORMAL HIGH (ref 0.05–0.27)

## 2023-03-09 LAB — TROPONIN I (HIGH SENSITIVITY): Troponin I (High Sensitivity): 506 ng/L (ref <18)

## 2023-03-09 LAB — PROTIME-INR
INR: 1.2 (ref 0.8–1.2)
Prothrombin Time: 15.7 s — ABNORMAL HIGH (ref 11.4–15.2)

## 2023-03-09 MED ORDER — NITROGLYCERIN 0.4 MG SL SUBL: 0.4000 mg | SUBLINGUAL_TABLET | SUBLINGUAL | Status: DC | PRN

## 2023-03-09 MED ORDER — SODIUM CHLORIDE 0.9 % IV SOLN: INTRAVENOUS | Status: AC

## 2023-03-09 MED ORDER — INSULIN ASPART 100 UNIT/ML IJ SOLN
0.0000 [IU] | Freq: Three times a day (TID) | INTRAMUSCULAR | Status: DC
  Administered 2023-03-09: 9 [IU] via SUBCUTANEOUS
  Administered 2023-03-11 (×2): 2 [IU] via SUBCUTANEOUS
  Administered 2023-03-12: 3 [IU] via SUBCUTANEOUS
  Administered 2023-03-13: 1 [IU] via SUBCUTANEOUS
  Administered 2023-03-13 – 2023-03-14 (×2): 3 [IU] via SUBCUTANEOUS
  Administered 2023-03-14: 5 [IU] via SUBCUTANEOUS
  Administered 2023-03-15: 1 [IU] via SUBCUTANEOUS
  Administered 2023-03-15: 5 [IU] via SUBCUTANEOUS
  Filled 2023-03-09 (×10): qty 1

## 2023-03-09 MED ORDER — INSULIN GLARGINE-YFGN 100 UNIT/ML ~~LOC~~ SOLN
15.0000 [IU] | Freq: Two times a day (BID) | SUBCUTANEOUS | Status: DC
  Administered 2023-03-09: 15 [IU] via SUBCUTANEOUS
  Filled 2023-03-09 (×2): qty 0.15

## 2023-03-09 MED ORDER — HEPARIN (PORCINE) 25000 UT/250ML-% IV SOLN: 800.0000 [IU]/h | INTRAVENOUS | Status: DC

## 2023-03-09 MED ORDER — INSULIN ASPART 100 UNIT/ML IJ SOLN: 0.0000 [IU] | Freq: Every day | INTRAMUSCULAR | Status: DC

## 2023-03-09 MED ORDER — INFLUENZA VAC A&B SURF ANT ADJ 0.5 ML IM SUSY
0.5000 mL | PREFILLED_SYRINGE | INTRAMUSCULAR | Status: DC
  Filled 2023-03-09: qty 0.5

## 2023-03-09 MED ORDER — LACTATED RINGERS IV BOLUS: 20.0000 mL/kg | Freq: Once | INTRAVENOUS | Status: DC

## 2023-03-09 MED ORDER — ACETAMINOPHEN 325 MG PO TABS
650.0000 mg | ORAL_TABLET | ORAL | Status: DC | PRN
  Administered 2023-03-16 – 2023-03-18 (×3): 650 mg via ORAL
  Filled 2023-03-09 (×3): qty 2

## 2023-03-09 MED ORDER — ASPIRIN 81 MG PO TBEC
81.0000 mg | DELAYED_RELEASE_TABLET | Freq: Every day | ORAL | Status: DC
  Administered 2023-03-10 – 2023-03-19 (×10): 81 mg via ORAL
  Filled 2023-03-09 (×10): qty 1

## 2023-03-09 MED ORDER — ORAL CARE MOUTH RINSE: 15.0000 mL | OROMUCOSAL | Status: DC | PRN

## 2023-03-09 MED ORDER — ASPIRIN 81 MG PO CHEW: 324.0000 mg | CHEWABLE_TABLET | ORAL | Status: DC

## 2023-03-09 MED ORDER — ONDANSETRON HCL 4 MG/2ML IJ SOLN: 4.0000 mg | Freq: Four times a day (QID) | INTRAMUSCULAR | Status: DC | PRN

## 2023-03-09 MED ORDER — ASPIRIN 300 MG RE SUPP: 300.0000 mg | RECTAL | Status: DC

## 2023-03-09 MED ORDER — INSULIN GLARGINE-YFGN 100 UNIT/ML ~~LOC~~ SOLN
10.0000 [IU] | Freq: Once | SUBCUTANEOUS | Status: AC
  Administered 2023-03-09: 10 [IU] via SUBCUTANEOUS
  Filled 2023-03-09: qty 0.1

## 2023-03-09 MED ORDER — HEPARIN (PORCINE) 25000 UT/250ML-% IV SOLN
800.0000 [IU]/h | INTRAVENOUS | Status: DC
  Administered 2023-03-09: 800 [IU]/h via INTRAVENOUS
  Filled 2023-03-09: qty 250

## 2023-03-09 MED ORDER — SODIUM ZIRCONIUM CYCLOSILICATE 10 G PO PACK
10.0000 g | PACK | Freq: Once | ORAL | Status: AC
  Administered 2023-03-09: 10 g via ORAL
  Filled 2023-03-09: qty 1

## 2023-03-09 MED ORDER — INSULIN GLARGINE-YFGN 100 UNIT/ML ~~LOC~~ SOLN
10.0000 [IU] | Freq: Every day | SUBCUTANEOUS | Status: DC
  Filled 2023-03-09: qty 0.1

## 2023-03-09 NOTE — Assessment & Plan Note (Signed)
Holding hydralazine as patient also on midodrine for orthostatic hypotension

## 2023-03-09 NOTE — ED Notes (Signed)
ED TO INPATIENT HANDOFF REPORT  ED Nurse Name and Phone #: Heran Campau [phone]  S Name/Age/Gender Nicholas Weiss 72 y.o. male Room/Bed: ED16A/ED16A  Code Status   Code Status: Full Code  Home/SNF/Other Nursing Home Patient oriented to: self and place Is this baseline? No   Triage Complete: Triage complete  Chief Complaint DKA (diabetic ketoacidosis) (HCC) [E11.10] Acute metabolic encephalopathy [G93.41]  Triage Note EMS brings pt in from [facility]; staff st AMS since Friday; recently admitted for multiple rib fx and intracranial hemorrhage  Pt to ED via [facility] from [facility]. Per EMS pt has been altered since Friday and has high blood glucose readings. Pt has hx of brain bleed about 2 weeks ago. Pt A&Ox4 at this time and answering questions appropriately. Pt has hx of diabetes. CBG 454 in triage   Allergies Allergies  Allergen Reactions   Penicillins Other (See Comments)    Childhood allergy -tolerated amoxil 03/2022 Unknown reaction Has patient had a PCN reaction causing immediate rash, facial/tongue/throat swelling, SOB or lightheadedness with hypotension: No Has patient had a PCN reaction causing severe rash involving mucus membranes or skin necrosis: No Has patient had a PCN reaction that required hospitalization No Has patient had a PCN reaction occurring within the last 10 years: No If all of the above answers are "NO", then may proceed with Cephalosporin use.     Level of Care/Admitting Diagnosis ED Disposition     ED Disposition  Admit   Condition  --   Comment  Hospital Area: [facility] [[id number]]  Level of Care: Progressive [102]  Admit to Progressive based on following criteria: ACUTE MENTAL DISORDER-RELATED Drug/Alcohol Ingestion/Overdose/Withdrawal, Suicidal Ideation/attempt requiring safety sitter and < Q2h monitoring/assessments, moderate to severe agitation that is managed with medication/sitter, CIWA-Ar score < 20.  Covid  Evaluation: Asymptomatic - no recent exposure (last 10 days) testing not required  Diagnosis: Acute metabolic encephalopathy [[id number]]  Admitting Physician: Loyce Dys [[id number]]  Attending Physician: Loyce Dys [[id number]]  Certification:: I certify this patient will need inpatient services for at least 2 midnights          B Medical/Surgery History Past Medical History:  Diagnosis Date   Acute metabolic encephalopathy 12/02/2020   DKA (diabetic ketoacidoses) 04/06/2016   Hypercholesteremia    Hypertension    Past Surgical History:  Procedure Laterality Date   AMPUTATION TOE Left 04/25/2022   Procedure: LEFT GREAT TOE AMPUTATION AND LEFT PARTIAL 2ND TOE AMPUTATION;  Surgeon: Felecia Shelling, DPM;  Location: [facility] ORS;  Service: Podiatry;  Laterality: Left;   COLONOSCOPY WITH PROPOFOL N/A 02/01/2020   Procedure: COLONOSCOPY WITH PROPOFOL;  Surgeon: Toney Reil, MD;  Location: [facility] ENDOSCOPY;  Service: Gastroenterology;  Laterality: N/A;   ESOPHAGOGASTRODUODENOSCOPY  02/01/2020   Procedure: ESOPHAGOGASTRODUODENOSCOPY (EGD);  Surgeon: Toney Reil, MD;  Location: [facility] ENDOSCOPY;  Service: Gastroenterology;;   ESOPHAGOGASTRODUODENOSCOPY (EGD) WITH PROPOFOL N/A 06/07/2022   Procedure: ESOPHAGOGASTRODUODENOSCOPY (EGD) WITH PROPOFOL;  Surgeon: Kerin Salen, MD;  Location: [facility] ENDOSCOPY;  Service: Gastroenterology;  Laterality: N/A;   KNEE SURGERY Right    Torn meniscus   KNEE SURGERY Left      A IV Location/Drains/Wounds Patient Lines/Drains/Airways Status     Active Line/Drains/Airways     Name Placement date Placement time Site Days   Peripheral IV 03/08/23 22 G Posterior;Right Hand 03/08/23  2321  Hand  1   Peripheral IV 03/08/23 20 G Left;Posterior Hand 03/08/23  2321  Hand  1  Peripheral IV 03/09/23 18 G 2.5" Anterior;Left;Proximal;Upper Arm 03/09/23  0331  Arm  less than 1            Intake/Output Last 24 hours  Intake/Output Summary (Last 24 hours)  at 03/09/2023 1315 Last data filed at 03/09/2023 1610 Gross per 24 hour  Intake 17.06 ml  Output --  Net 17.06 ml    Labs/Imaging Results for orders placed or performed during the hospital encounter of 03/08/23 (from the past 48 hour(s))  CBG monitoring, ED     Status: Abnormal   Collection Time: 03/08/23  9:19 PM  Result Value Ref Range   Glucose-Capillary 454 (H) 70 - 99 mg/dL    Comment: Glucose reference range applies only to samples taken after fasting for at least 8 hours.  Basic metabolic panel     Status: Abnormal   Collection Time: 03/08/23  9:26 PM  Result Value Ref Range   Sodium 137 135 - 145 mmol/L    Comment: ELECTROLYTES REPEATED TO VERIFY SKL   Potassium 4.5 3.5 - 5.1 mmol/L   Chloride 102 98 - 111 mmol/L   CO2 13 (L) 22 - 32 mmol/L   Glucose, Bld 493 (H) 70 - 99 mg/dL    Comment: Glucose reference range applies only to samples taken after fasting for at least 8 hours.   BUN 69 (H) 8 - 23 mg/dL   Creatinine, Ser 9.60 (H) 0.61 - 1.24 mg/dL   Calcium 8.4 (L) 8.9 - 10.3 mg/dL   GFR, Estimated 14 (L) >60 mL/min    Comment: (NOTE) Calculated using the CKD-EPI Creatinine Equation (2021)    Anion gap 22 (H) 5 - 15    Comment: Performed at [facility], [address] [place], [place] [address]  CBC     Status: Abnormal   Collection Time: 03/08/23  9:26 PM  Result Value Ref Range   WBC 8.2 4.0 - 10.5 K/uL   RBC 2.66 (L) 4.22 - 5.81 MIL/uL   Hemoglobin 8.2 (L) 13.0 - 17.0 g/dL   HCT 81.1 (L) 91.4 - 78.2 %   MCV 99.6 80.0 - 100.0 fL   MCH 30.8 26.0 - 34.0 pg   MCHC 30.9 30.0 - 36.0 g/dL   RDW 95.6 (H) 21.3 - 08.6 %   Platelets 186 150 - 400 K/uL   nRBC 0.0 0.0 - 0.2 %    Comment: Performed at [facility], [address] [place], [place] [address]  Troponin I (High Sensitivity)     Status: Abnormal   Collection Time: 03/08/23  9:26 PM  Result Value Ref Range   Troponin I (High Sensitivity) 438 (HH) <18 ng/L    Comment: CRITICAL RESULT  CALLED TO, READ BACK BY AND VERIFIED WITH LISA THOMPSON @2208  ON 03/08/23 SKL (NOTE) Elevated high sensitivity troponin I (hsTnI) values and significant  changes across serial measurements may suggest ACS but many other  chronic and acute conditions are known to elevate hsTnI results.  Refer to the "Links" section for chest pain algorithms and additional  guidance. Performed at [facility], [address] [place], [place] [address]   Blood gas, venous     Status: Abnormal   Collection Time: 03/08/23 10:52 PM  Result Value Ref Range   pH, Ven 7.3 7.25 - 7.43   pCO2, Ven 37 (L) 44 - 60 mmHg   pO2, Ven 39 32 - 45 mmHg   Bicarbonate 18.2 (L) 20.0 - 28.0 mmol/L   Acid-base deficit  7.5 (H) 0.0 - 2.0 mmol/L   O2 Saturation 62.6 %   Patient temperature 37.0    Collection site VEIN     Comment: Performed at [facility], [address] [place], [place] [address]  Urinalysis, Routine w reflex microscopic -Urine, Random     Status: Abnormal   Collection Time: 03/08/23 10:57 PM  Result Value Ref Range   Color, Urine YELLOW (A) YELLOW   APPearance HAZY (A) CLEAR   Specific Gravity, Urine 1.016 1.005 - 1.030   pH 5.0 5.0 - 8.0   Glucose, UA >=500 (A) NEGATIVE mg/dL   Hgb urine dipstick NEGATIVE NEGATIVE   Bilirubin Urine NEGATIVE NEGATIVE   Ketones, ur 20 (A) NEGATIVE mg/dL   Protein, ur 30 (A) NEGATIVE mg/dL   Nitrite NEGATIVE NEGATIVE   Leukocytes,Ua NEGATIVE NEGATIVE   RBC / HPF 0-5 0 - 5 RBC/hpf   WBC, UA 6-10 0 - 5 WBC/hpf   Bacteria, UA FEW (A) NONE SEEN   Squamous Epithelial / HPF 0-5 0 - 5 /HPF   Mucus PRESENT    Hyaline Casts, UA PRESENT     Comment: Performed at [facility], [address], [place], [place] [address]  Basic metabolic panel     Status: Abnormal   Collection Time: 03/08/23 10:57 PM  Result Value Ref Range   Sodium 139 135 - 145 mmol/L   Potassium 4.5 3.5 - 5.1 mmol/L   Chloride 102 98 - 111 mmol/L   CO2 18 (L) 22 - 32  mmol/L   Glucose, Bld 413 (H) 70 - 99 mg/dL    Comment: Glucose reference range applies only to samples taken after fasting for at least 8 hours.   BUN 72 (H) 8 - 23 mg/dL   Creatinine, Ser 0.98 (H) 0.61 - 1.24 mg/dL   Calcium 8.7 (L) 8.9 - 10.3 mg/dL   GFR, Estimated 15 (L) >60 mL/min    Comment: (NOTE) Calculated using the CKD-EPI Creatinine Equation (2021)    Anion gap 19 (H) 5 - 15    Comment: Performed at [facility], [address] [place], [place] [address]  Beta-hydroxybutyric acid     Status: Abnormal   Collection Time: 03/08/23 10:57 PM  Result Value Ref Range   Beta-Hydroxybutyric Acid 4.42 (H) 0.05 - 0.27 mmol/L    Comment: RESULT CONFIRMED BY MANUAL DILUTION SKL Performed at [facility], [address] [place], [place] [address]   Hepatic function panel     Status: Abnormal   Collection Time: 03/08/23 10:57 PM  Result Value Ref Range   Total Protein 9.0 (H) 6.5 - 8.1 g/dL   Albumin 3.8 3.5 - 5.0 g/dL   AST 31 15 - 41 U/L   ALT 26 0 - 44 U/L   Alkaline Phosphatase 89 38 - 126 U/L   Total Bilirubin 1.2 (H) <1.2 mg/dL   Bilirubin, Direct <6.2 0.0 - 0.2 mg/dL   Indirect Bilirubin NOT CALCULATED 0.3 - 0.9 mg/dL    Comment: Performed at [facility], [address] [place], [place] [address]  Troponin I (High Sensitivity)     Status: Abnormal   Collection Time: 03/08/23 10:57 PM  Result Value Ref Range   Troponin I (High Sensitivity) 506 (HH) <18 ng/L    Comment: CRITICAL VALUE NOTED. VALUE IS CONSISTENT WITH PREVIOUSLY REPORTED/CALLED VALUE SKL (NOTE) Elevated high sensitivity troponin I (hsTnI) values and significant  changes across serial measurements may suggest ACS but many other  chronic  and acute conditions are known to elevate hsTnI results.  Refer to the "Links" section for chest pain algorithms and additional  guidance. Performed at [facility], [address] [place], [place] [address]   CBG monitoring, ED      Status: Abnormal   Collection Time: 03/08/23 11:19 PM  Result Value Ref Range   Glucose-Capillary 380 (H) 70 - 99 mg/dL    Comment: Glucose reference range applies only to samples taken after fasting for at least 8 hours.  CBG monitoring, ED     Status: Abnormal   Collection Time: 03/09/23 12:46 AM  Result Value Ref Range   Glucose-Capillary 307 (H) 70 - 99 mg/dL    Comment: Glucose reference range applies only to samples taken after fasting for at least 8 hours.  CBG monitoring, ED     Status: Abnormal   Collection Time: 03/09/23  1:52 AM  Result Value Ref Range   Glucose-Capillary 193 (H) 70 - 99 mg/dL    Comment: Glucose reference range applies only to samples taken after fasting for at least 8 hours.  Basic metabolic panel     Status: Abnormal   Collection Time: 03/09/23  2:43 AM  Result Value Ref Range   Sodium 142 135 - 145 mmol/L   Potassium 3.8 3.5 - 5.1 mmol/L   Chloride 110 98 - 111 mmol/L   CO2 25 22 - 32 mmol/L   Glucose, Bld 134 (H) 70 - 99 mg/dL    Comment: Glucose reference range applies only to samples taken after fasting for at least 8 hours.   BUN 68 (H) 8 - 23 mg/dL   Creatinine, Ser 3.22 (H) 0.61 - 1.24 mg/dL   Calcium 8.3 (L) 8.9 - 10.3 mg/dL   GFR, Estimated 18 (L) >60 mL/min    Comment: (NOTE) Calculated using the CKD-EPI Creatinine Equation (2021)    Anion gap 7 5 - 15    Comment: Performed at [facility], [address] [place], [place] [address]  Heparin level (unfractionated)     Status: Abnormal   Collection Time: 03/09/23  2:43 AM  Result Value Ref Range   Heparin Unfractionated >1.10 (H) 0.30 - 0.70 IU/mL    Comment: (NOTE) The clinical reportable range upper limit is being lowered to >1.10 to align with the FDA approved guidance for the current laboratory assay.  If heparin results are below expected values, and patient dosage has  been confirmed, suggest follow up testing of antithrombin III levels. Performed at [facility], [address] [place], [place] [address]   Protime-INR     Status: Abnormal   Collection Time: 03/09/23  2:43 AM  Result Value Ref Range   Prothrombin Time 15.7 (H) 11.4 - 15.2 seconds   INR 1.2 0.8 - 1.2    Comment: (NOTE) INR goal varies based on device and disease states. Performed at [facility], [address] [place], [place] [address]   APTT     Status: None   Collection Time: 03/09/23  2:43 AM  Result Value Ref Range   aPTT 29 24 - 36 seconds    Comment: Performed at [facility], [address] [place], [place] [address]  CBG monitoring, ED     Status: Abnormal   Collection Time: 03/09/23  3:32 AM  Result Value Ref Range   Glucose-Capillary 138 (H) 70 - 99 mg/dL    Comment: Glucose reference range applies only to samples taken after fasting for  at least 8 hours.  CBG monitoring, ED     Status: Abnormal   Collection Time: 03/09/23  4:27 AM  Result Value Ref Range   Glucose-Capillary 120 (H) 70 - 99 mg/dL    Comment: Glucose reference range applies only to samples taken after fasting for at least 8 hours.  CBG monitoring, ED     Status: Abnormal   Collection Time: 03/09/23  5:54 AM  Result Value Ref Range   Glucose-Capillary 127 (H) 70 - 99 mg/dL    Comment: Glucose reference range applies only to samples taken after fasting for at least 8 hours.  CBG monitoring, ED     Status: Abnormal   Collection Time: 03/09/23  7:13 AM  Result Value Ref Range   Glucose-Capillary 142 (H) 70 - 99 mg/dL    Comment: Glucose reference range applies only to samples taken after fasting for at least 8 hours.  Basic metabolic panel     Status: Abnormal   Collection Time: 03/09/23  7:14 AM  Result Value Ref Range   Sodium 141 135 - 145 mmol/L   Potassium 4.2 3.5 - 5.1 mmol/L   Chloride 109 98 - 111 mmol/L   CO2 24 22 - 32 mmol/L   Glucose, Bld 162 (H) 70 - 99 mg/dL    Comment: Glucose reference range applies only to samples taken after fasting for  at least 8 hours.   BUN 66 (H) 8 - 23 mg/dL   Creatinine, Ser 1.47 (H) 0.61 - 1.24 mg/dL   Calcium 8.3 (L) 8.9 - 10.3 mg/dL   GFR, Estimated 18 (L) >60 mL/min    Comment: (NOTE) Calculated using the CKD-EPI Creatinine Equation (2021)    Anion gap 8 5 - 15    Comment: Performed at [facility], [address] [place], [place] [address]  Beta-hydroxybutyric acid     Status: Abnormal   Collection Time: 03/09/23  7:14 AM  Result Value Ref Range   Beta-Hydroxybutyric Acid 0.31 (H) 0.05 - 0.27 mmol/L    Comment: Performed at [facility], [address] [place], [place] [address]  CBG monitoring, ED     Status: Abnormal   Collection Time: 03/09/23  8:52 AM  Result Value Ref Range   Glucose-Capillary 147 (H) 70 - 99 mg/dL    Comment: Glucose reference range applies only to samples taken after fasting for at least 8 hours.   *Note: Due to a large number of results and/or encounters for the requested time period, some results have not been displayed. A complete set of results can be found in Results Review.   DG Chest Port 1 View  Result Date: 03/09/2023 CLINICAL DATA:  DKA EXAM: PORTABLE CHEST 1 VIEW COMPARISON:  02/12/2023 FINDINGS: Heart and mediastinal contours are within normal limits. No focal opacities or effusions. No acute bony abnormality. Aortic atherosclerosis. IMPRESSION: No active disease. Electronically Signed   By: Charlett Nose M.D.   On: 03/09/2023 01:10   CT Head Wo Contrast  Result Date: 03/08/2023 CLINICAL DATA:  Follow-up recent intracranial hemorrhage. EXAM: CT HEAD WITHOUT CONTRAST TECHNIQUE: Contiguous axial images were obtained from the base of the skull through the vertex without intravenous contrast. RADIATION DOSE REDUCTION: This exam was performed according to the departmental dose-optimization program which includes automated exposure control, adjustment of the mA and/or kV according to patient size and/or use of iterative reconstruction  technique. COMPARISON:  Head CT dated 02/20/2023. FINDINGS: Evaluation of this exam is limited due to motion  artifact. Brain: Mild age-related atrophy and moderate chronic microvascular ischemic changes. Interval resolution of the previously seen intraventricular hemorrhage. No acute intracranial hemorrhage. No mass effect or midline shift. No extra-axial fluid collection. Vascular: No hyperdense vessel or unexpected calcification. Skull: Normal. Negative for fracture or focal lesion. Sinuses/Orbits: No acute finding. Other: None IMPRESSION: 1. No acute intracranial pathology. 2. Interval resolution of the previously seen intraventricular hemorrhage. 3. Mild age-related atrophy and moderate chronic microvascular ischemic changes. Electronically Signed   By: Elgie Collard M.D.   On: 03/08/2023 22:24    Pending Labs Unresulted Labs (From admission, onward)     Start     Ordered   03/09/23 1200  APTT  Once-Timed,   TIMED        03/09/23 0411   03/09/23 0500  Lipoprotein A (LPA)  Tomorrow morning,   R        03/09/23 0020   03/08/23 2252  Basic metabolic panel  (Diabetes Ketoacidosis (DKA))  STAT Now then every 4 hours ,   STAT      03/08/23 2253   03/08/23 2252  Beta-hydroxybutyric acid  (Diabetes Ketoacidosis (DKA))  Now then every 8 hours,   URGENT      03/08/23 2253            Vitals/Pain Today's Vitals   03/09/23 0630 03/09/23 0800 03/09/23 0900 03/09/23 1230  BP: (!) 151/83 (!) 143/84 (!) 167/155 (!) 145/73  Pulse: 87 89 88 93  Resp: 10 17 14 13   Temp:      TempSrc:      SpO2: 100% 100% 100% 100%  Weight:      PainSc:        Isolation Precautions No active isolations  Medications Medications  dextrose 50 % solution 0-50 mL (has no administration in time range)  0.9 %  sodium chloride infusion ( Intravenous Not Given 03/09/23 0201)  dextrose 5 % and 0.45 % NaCl infusion (0 mLs Intravenous Stopped 03/09/23 0953)  aspirin EC tablet 81 mg (has no administration in time  range)  nitroGLYCERIN (NITROSTAT) SL tablet 0.4 mg (has no administration in time range)  acetaminophen (TYLENOL) tablet 650 mg (has no administration in time range)  ondansetron (ZOFRAN) injection 4 mg (has no administration in time range)  heparin ADULT infusion 100 units/mL (25000 units/280mL) (800 Units/hr Intravenous New Bag/Given 03/09/23 0338)  insulin aspart (novoLOG) injection 0-9 Units ( Subcutaneous Not Given 03/09/23 1313)  insulin aspart (novoLOG) injection 0-5 Units (has no administration in time range)  insulin glargine-yfgn (SEMGLEE) injection 10 Units (has no administration in time range)  sodium chloride 0.9 % bolus 1,248 mL (0 mLs Intravenous Stopped 03/09/23 0204)  potassium chloride 10 mEq in 100 mL IVPB (0 mEq Intravenous Stopped 03/09/23 0204)  aspirin suppository 300 mg (300 mg Rectal Given 03/08/23 2355)    Mobility Unsure how pt normally ambulates.      Focused Assessments Neuro Assessment Handoff:  Swallow screen pass?    Cardiac Rhythm: Normal sinus rhythm       Neuro Assessment: Exceptions to WDL Neuro Checks:      Has TPA been given? No If patient is a Neuro Trauma and patient is going to OR before floor call report to 4N Charge nurse: [phone] or [phone]   R Recommendations: See Admitting Provider Note  Report given to:   Additional Notes:

## 2023-03-09 NOTE — Plan of Care (Addendum)
Patient remains on AR-2A at time of writing. Upon change of shift, patient is agitated, climbing out of bed, and non-redirectable. An order was placed by this RN for a tele-sitter. This RN was then told by [employer] there were no tele-sitters available in the building, that they were all in use, so the order was edited for a 1:1 Recruitment consultant. The Charge Nurse, Maricar, was updated and a NT was pulled from 2A to sit with the patient (no additional staff were available to pull from elsewhere). Education assessment completed with this patient by this RN overnight; education assessment had not been previously completed for this encounter. Patient's admission profile was completed overnight by this RN, as much as was possible given the patient's mentation, and minus the PTA med list. Patient has been appropriately scored as a "high risk" for falls; however, inadequate fall prevention measures were in place upon change of shift. Fall prevention bundle implemented by this RN including yellow falls risk arm band and the 1:1 safety sitter previously mentioned. The overnight provider/APP was notified of the sitter by this RN: at 1949 hours a secure chat was sent by this RN to Larkin Ina, NP, stating: "S: FYI, we ordered a bedside safety sitter for the patient. B: Patient is here with NSTEMI, DKA, AMS. A: Patient is oriented to person only, non-redirectable, extremely high falls risk. R: no tele-sitters available in the building, so we pulled an NT for bedside sitter." A reply from Larkin Ina, NP was received at 1953 hours stating: "Sounds reasonable. Thanks for the update." Adult oral care protocol initiated overnight by this RN, per protocol. Of note, the IV tubing for the patient's MIVF was found to be unlabeled, so it was discarded per best practice and replaced overnight.    Problem: Education: Goal: Ability to describe self-care measures that may prevent or decrease complications (Diabetes Survival Skills  Education) will improve Outcome: Not Progressing Goal: Individualized Educational Video(s) Outcome: Not Progressing   Problem: Coping: Goal: Ability to adjust to condition or change in health will improve Outcome: Not Progressing   Problem: Fluid Volume: Goal: Ability to maintain a balanced intake and output will improve Outcome: Not Progressing   Problem: Health Behavior/Discharge Planning: Goal: Ability to identify and utilize available resources and services will improve Outcome: Not Progressing Goal: Ability to manage health-related needs will improve Outcome: Not Progressing   Problem: Metabolic: Goal: Ability to maintain appropriate glucose levels will improve Outcome: Not Progressing   Problem: Nutritional: Goal: Maintenance of adequate nutrition will improve Outcome: Not Progressing Goal: Progress toward achieving an optimal weight will improve Outcome: Not Progressing   Problem: Skin Integrity: Goal: Risk for impaired skin integrity will decrease Outcome: Not Progressing   Problem: Tissue Perfusion: Goal: Adequacy of tissue perfusion will improve Outcome: Not Progressing   Problem: Education: Goal: Understanding of cardiac disease, CV risk reduction, and recovery process will improve Outcome: Not Progressing Goal: Individualized Educational Video(s) Outcome: Not Progressing   Problem: Activity: Goal: Ability to tolerate increased activity will improve Outcome: Not Progressing   Problem: Cardiac: Goal: Ability to achieve and maintain adequate cardiovascular perfusion will improve Outcome: Not Progressing   Problem: Health Behavior/Discharge Planning: Goal: Ability to safely manage health-related needs after discharge will improve Outcome: Not Progressing   Problem: Education: Goal: Ability to describe self-care measures that may prevent or decrease complications (Diabetes Survival Skills Education) will improve Outcome: Not Progressing Goal:  Individualized Educational Video(s) Outcome: Not Progressing   Problem: Cardiac: Goal: Ability to  maintain an adequate cardiac output will improve Outcome: Not Progressing   Problem: Health Behavior/Discharge Planning: Goal: Ability to identify and utilize available resources and services will improve Outcome: Not Progressing Goal: Ability to manage health-related needs will improve Outcome: Not Progressing   Problem: Fluid Volume: Goal: Ability to achieve a balanced intake and output will improve Outcome: Not Progressing   Problem: Metabolic: Goal: Ability to maintain appropriate glucose levels will improve Outcome: Not Progressing   Problem: Nutritional: Goal: Maintenance of adequate nutrition will improve Outcome: Not Progressing Goal: Maintenance of adequate weight for body size and type will improve Outcome: Not Progressing   Problem: Respiratory: Goal: Will regain and/or maintain adequate ventilation Outcome: Not Progressing   Problem: Urinary Elimination: Goal: Ability to achieve and maintain adequate renal perfusion and functioning will improve Outcome: Not Progressing   Problem: Education: Goal: Knowledge of General Education information will improve Description: Including pain rating scale, medication(s)/side effects and non-pharmacologic comfort measures Outcome: Not Progressing   Problem: Health Behavior/Discharge Planning: Goal: Ability to manage health-related needs will improve Outcome: Not Progressing   Problem: Clinical Measurements: Goal: Ability to maintain clinical measurements within normal limits will improve Outcome: Not Progressing Goal: Will remain free from infection Outcome: Not Progressing Goal: Diagnostic test results will improve Outcome: Not Progressing Goal: Respiratory complications will improve Outcome: Not Progressing Goal: Cardiovascular complication will be avoided Outcome: Not Progressing   Problem: Activity: Goal: Risk  for activity intolerance will decrease Outcome: Not Progressing   Problem: Nutrition: Goal: Adequate nutrition will be maintained Outcome: Not Progressing   Problem: Coping: Goal: Level of anxiety will decrease Outcome: Not Progressing   Problem: Elimination: Goal: Will not experience complications related to bowel motility Outcome: Not Progressing Goal: Will not experience complications related to urinary retention Outcome: Not Progressing   Problem: Pain Management: Goal: General experience of comfort will improve Outcome: Not Progressing   Problem: Safety: Goal: Ability to remain free from injury will improve Outcome: Not Progressing   Problem: Skin Integrity: Goal: Risk for impaired skin integrity will decrease Outcome: Not Progressing

## 2023-03-09 NOTE — Assessment & Plan Note (Signed)
Suspect demand ischemia secondary to DKA as well as elevation from worsening renal function Patient has no chest pain and EKG nonacute Continue to trend troponins Continuous cardiac monitoring Will get echo to evaluate for Focal wall motion abnormality

## 2023-03-09 NOTE — Assessment & Plan Note (Signed)
Chronic anticoagulation Holding Eliquis due to recent intraventricular hemorrhage(was cleared to resume on 12/3) uncertain whether resumed Not on beta-blocker due to chronic hypotension

## 2023-03-09 NOTE — Assessment & Plan Note (Signed)
Secondary to DKA Given recent intraventricular hemorrhage and resumption of Eliquis on 12/3 will get CT head CT head ordered

## 2023-03-09 NOTE — Assessment & Plan Note (Signed)
Suspect demand ischemia secondary to DKA as well as elevation from worsening renal function Patient has no chest pain and EKG nonacute Continue to trend troponins Continuous cardiac monitoring Will get echo to evaluate for Focal wall motion abnormality ---> Trop [id number]-->[id number].Marland KitchenMarland KitchenWill start heparin infusion And consult cardiology

## 2023-03-09 NOTE — Assessment & Plan Note (Signed)
No acute disease suspected ?

## 2023-03-09 NOTE — Inpatient Diabetes Management (Signed)
Inpatient Diabetes Program Recommendations  AACE/ADA: New Consensus Statement on Inpatient Glycemic Control (2015)  Target Ranges:  Prepandial:   less than 140 mg/dL      Peak postprandial:   less than 180 mg/dL (1-2 hours)      Critically ill patients:  140 - 180 mg/dL   Lab Results  Component Value Date   GLUCAP 147 (H) 03/09/2023   HGBA1C 9.0 (H) 11/14/2022    Review of Glycemic Control  Diabetes history: DM1 Outpatient Diabetes medications: Tresiba 5 units every day, Humalog 0-9 units TID  Current orders for Inpatient glycemic control: IV insulin per endotool  Inpatient Diabetes Program Recommendations:   Patient well known to DM coordinators due to 6 ED admissions/6 month and 4 inpatient admissions/6 months. When ready to transition back to subcutaneous insulin, please consider: -Semglee 5 units to be given 2 hrs prior to IV insulin discontinued  -Novolog 0-9 units tid and 0-5 units hs correction (cover CBG @ time of IV insulin discontinued)  Thank you, Nicholas Weiss. Corena Tilson, RN, MSN, CDCES  Diabetes Coordinator Inpatient Glycemic Control Team Team Pager [phone] (8am-5pm) 03/09/2023 9:33 AM

## 2023-03-09 NOTE — Assessment & Plan Note (Signed)
Restarted on Eliquis 12/3 Will get CT head

## 2023-03-09 NOTE — Consult Note (Signed)
Nicholas Weiss       Patient ID: Nicholas Weiss MRN: [id number] DOB/AGE: May 30, 1950 72 y.o.  Admit date: 03/08/2023 Referring Physician Dr. Lindajo Royal Primary Physician Marisue Ivan, MD  Primary Cardiologist None - seen by [facility] during prior hospitalizations Reason for Consultation elevated troponins  HPI: Nicholas Weiss is a 72 y.o. male  with a past medical history of uncontrolled type 1 diabetes, multiple myeloma, CKD 3, history of CVA, HTN, HLD, cocaine abuse  who presented to the ED on 03/08/2023 for AMS. Found to be in DKA, troponins checked and elevated. Cardiology was consulted for further evaluation.   Portions of the history obtained via chart review as patient is a difficult historian.  He was brought to the ED yesterday due to concerns for altered mental status.  He was recently hospitalized from 11/18 - 11/27 with intraventricular hemorrhage.  He was discharged to a facility and reportedly had altered mental status for a few days prior to admission this time.  Brought to the ED by EMS yesterday.  Workup in the ED notable for creatinine 4.24 up from baseline 1.52 weeks ago, potassium 4.5, glucose 493, hemoglobin 8.2, WBC 8.2.  pH 7.3 on blood gas, bicarb 18.2.  Troponins trended 438 > 506.  He was started on heparin and insulin infusions in the ED. EKG demonstrated normal sinus rhythm, nonacute.  At the time of my evaluation this morning patient is resting comfortably in ED stretcher.  States that he did have some vomiting prior to his admission.  Also endorses some confusion.  Denies any reduced urine output.  He also denies any chest pain, shortness of breath, anginal symptoms.  States he has not had the symptoms at all now or recently.  He endorses some fatigue but otherwise has no complaints.  Somnolent throughout exam, falls asleep easily but also arouses to questioning and answers questions appropriately.  Review of systems complete and found to be  negative unless listed above    Past Medical History:  Diagnosis Date   Acute metabolic encephalopathy 12/02/2020   DKA (diabetic ketoacidoses) 04/06/2016   Hypercholesteremia    Hypertension     Past Surgical History:  Procedure Laterality Date   AMPUTATION TOE Left 04/25/2022   Procedure: LEFT GREAT TOE AMPUTATION AND LEFT PARTIAL 2ND TOE AMPUTATION;  Surgeon: Felecia Shelling, DPM;  Location: [facility] ORS;  Service: Podiatry;  Laterality: Left;   COLONOSCOPY WITH PROPOFOL N/A 02/01/2020   Procedure: COLONOSCOPY WITH PROPOFOL;  Surgeon: Toney Reil, MD;  Location: [facility] ENDOSCOPY;  Service: Gastroenterology;  Laterality: N/A;   ESOPHAGOGASTRODUODENOSCOPY  02/01/2020   Procedure: ESOPHAGOGASTRODUODENOSCOPY (EGD);  Surgeon: Toney Reil, MD;  Location: [facility] ENDOSCOPY;  Service: Gastroenterology;;   ESOPHAGOGASTRODUODENOSCOPY (EGD) WITH PROPOFOL N/A 06/07/2022   Procedure: ESOPHAGOGASTRODUODENOSCOPY (EGD) WITH PROPOFOL;  Surgeon: Kerin Salen, MD;  Location: [facility] ENDOSCOPY;  Service: Gastroenterology;  Laterality: N/A;   KNEE SURGERY Right    Torn meniscus   KNEE SURGERY Left     (Not in a hospital admission)  Social History   Socioeconomic History   Marital status: Single    Spouse name: Not on file   Number of children: 3   Years of education: Not on file   Highest education level: High school graduate  Occupational History    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  Tobacco Use   Smoking status: Never   Smokeless tobacco: Never  Vaping Use   Vaping status: Never Used  Substance and Sexual  Activity   Alcohol use: Not Currently    Alcohol/week: 0.0 - 1.0 standard drinks of alcohol    Comment: "once every 2 months"   Drug use: Yes    Types: Marijuana, "Crack" cocaine    Comment: last week   Sexual activity: Not Currently    Birth control/protection: None  Other Topics Concern   Not on file  Social History Narrative   Lives with girlfriend "sharon"   Social Determinants  of Health   Financial Resource Strain: Low Risk  (01/26/2023)   Received [facility]   Overall Financial Resource Strain (CARDIA)    Difficulty of Paying Living Expenses: Not very hard  Food Insecurity: No Food Insecurity (02/16/2023)   Hunger Vital Sign    Worried About Running Out of Food in the Last Year: Never true    Ran Out of Food in the Last Year: Never true  Transportation Needs: No Transportation Needs (02/16/2023)   PRAPARE - Administrator, Civil Service (Medical): No    Lack of Transportation (Non-Medical): No  Physical Activity: Insufficiently Active (01/01/2020)   Exercise Vital Sign    Days of Exercise per Week: 7 days    Minutes of Exercise per Session: 20 min  Stress: No Stress Concern Present (01/01/2020)   [employer] of Occupational Health - Occupational Stress Questionnaire    Feeling of Stress : Not at all  Social Connections: Moderately Isolated (01/01/2020)   Social Connection and Isolation Panel [NHANES]    Frequency of Communication with Friends and Family: More than three times a week    Frequency of Social Gatherings with Friends and Family: More than three times a week    Attends Religious Services: Never    Database administrator or Organizations: No    Attends Banker Meetings: Never    Marital Status: Living with partner  Intimate Partner Violence: Not At Risk (02/16/2023)   Humiliation, Afraid, Rape, and Kick questionnaire    Fear of Current or Ex-Partner: No    Emotionally Abused: No    Physically Abused: No    Sexually Abused: No    Family History  Problem Relation Age of Onset   Heart attack Father    Hypertension Sister    Cancer Sister      Vitals:   03/09/23 0600 03/09/23 0630 03/09/23 0800 03/09/23 0900  BP: (!) 162/112 (!) 151/83 (!) 143/84 (!) 167/155  Pulse: 87 87 89 88  Resp: 17 10 17 14   Temp:      TempSrc:      SpO2: 100% 100% 100% 100%  Weight:        PHYSICAL EXAM General:  Chronically ill-appearing, well nourished, in no acute distress. HEENT: Normocephalic and atraumatic. Neck: No JVD.  Lungs: Normal respiratory effort on room air. Clear bilaterally to auscultation. No wheezes, crackles, rhonchi.  Heart: HRRR. Normal S1 and S2 without gallops or murmurs.  Abdomen: Non-distended appearing.  Msk: Normal strength and tone for age. Extremities: Warm and well perfused. No clubbing, cyanosis. No edema.  Neuro: Alert and oriented X 3. Psych: Answers questions appropriately.   Labs: Basic Metabolic Panel: Recent Labs    03/09/23 0243 03/09/23 0714  NA 142 141  K 3.8 4.2  CL 110 109  CO2 25 24  GLUCOSE 134* 162*  BUN 68* 66*  CREATININE 3.60* 3.55*  CALCIUM 8.3* 8.3*   Liver Function Tests: Recent Labs    03/08/23 2257  AST 31  ALT 26  ALKPHOS 89  BILITOT 1.2*  PROT 9.0*  ALBUMIN 3.8   No results for input(s): "LIPASE", "AMYLASE" in the last 72 hours. CBC: Recent Labs    03/08/23 2126  WBC 8.2  HGB 8.2*  HCT 26.5*  MCV 99.6  PLT 186   Cardiac Enzymes: Recent Labs    03/08/23 2126 03/08/23 2257  TROPONINIHS 438* 506*   BNP: No results for input(s): "BNP" in the last 72 hours. D-Dimer: No results for input(s): "DDIMER" in the last 72 hours. Hemoglobin A1C: No results for input(s): "HGBA1C" in the last 72 hours. Fasting Lipid Panel: No results for input(s): "CHOL", "HDL", "LDLCALC", "TRIG", "CHOLHDL", "LDLDIRECT" in the last 72 hours. Thyroid Function Tests: No results for input(s): "TSH", "T4TOTAL", "T3FREE", "THYROIDAB" in the last 72 hours.  Invalid input(s): "FREET3" Anemia Panel: No results for input(s): "VITAMINB12", "FOLATE", "FERRITIN", "TIBC", "IRON", "RETICCTPCT" in the last 72 hours.   Radiology: [facility] Chest Port 1 View  Result Date: 03/09/2023 CLINICAL DATA:  DKA EXAM: PORTABLE CHEST 1 VIEW COMPARISON:  02/12/2023 FINDINGS: Heart and mediastinal contours are within normal limits. No focal opacities or effusions. No  acute bony abnormality. Aortic atherosclerosis. IMPRESSION: No active disease. Electronically Signed   By: Charlett Nose M.D.   On: 03/09/2023 01:10   CT Head Wo Contrast  Result Date: 03/08/2023 CLINICAL DATA:  Follow-up recent intracranial hemorrhage. EXAM: CT HEAD WITHOUT CONTRAST TECHNIQUE: Contiguous axial images were obtained from the base of the skull through the vertex without intravenous contrast. RADIATION DOSE REDUCTION: This exam was performed according to the departmental dose-optimization program which includes automated exposure control, adjustment of the mA and/or kV according to patient size and/or use of iterative reconstruction technique. COMPARISON:  Head CT dated 02/20/2023. FINDINGS: Evaluation of this exam is limited due to motion artifact. Brain: Mild age-related atrophy and moderate chronic microvascular ischemic changes. Interval resolution of the previously seen intraventricular hemorrhage. No acute intracranial hemorrhage. No mass effect or midline shift. No extra-axial fluid collection. Vascular: No hyperdense vessel or unexpected calcification. Skull: Normal. Negative for fracture or focal lesion. Sinuses/Orbits: No acute finding. Other: None IMPRESSION: 1. No acute intracranial pathology. 2. Interval resolution of the previously seen intraventricular hemorrhage. 3. Mild age-related atrophy and moderate chronic microvascular ischemic changes. Electronically Signed   By: Elgie Collard M.D.   On: 03/08/2023 22:24   US RENAL  Result Date: 02/20/2023 CLINICAL DATA:  [id number] CKD (chronic kidney disease) stage 3, GFR 30-59 ml/min (HCC) [id number] EXAM: RENAL / URINARY TRACT ULTRASOUND COMPLETE COMPARISON:  December 01, 2020 FINDINGS: Evaluation is limited by limited acoustic windows and shadowing bowel gas Right Kidney: Renal measurements: 9.3 x 3.7 x 4.5 cm = volume: 82 mL. Echogenicity is slightly increased, similar comparison to prior. No mass or hydronephrosis visualized. Left  Kidney: Renal measurements: 9.2 x 5.4 x 5.8 cm = volume: 150 mL. Echogenicity within normal limits. No mass or hydronephrosis visualized. Bladder: Appears normal for degree of bladder distention. Prevoid volume 337 ML. Postvoid volume 102 ML. Other: None. IMPRESSION: 1. No hydronephrosis. 2. Postvoid residual of 102 mL. Electronically Signed   By: Meda Klinefelter M.D.   On: 02/20/2023 11:56   CT HEAD WO CONTRAST ( )  Result Date: 02/20/2023 CLINICAL DATA:  72 year old male status post fall. Recent right frontal horn intraventricular hemorrhage after a fall. EXAM: CT HEAD WITHOUT CONTRAST TECHNIQUE: Contiguous axial images were obtained from the base of the skull through the vertex without intravenous contrast. RADIATION DOSE REDUCTION: This exam  was performed according to the departmental dose-optimization program which includes automated exposure control, adjustment of the mA and/or kV according to patient size and/or use of iterative reconstruction technique. COMPARISON:  Head CT 02/16/2023 and earlier. FINDINGS: Brain: Ongoing right lateral intraventricular hemorrhage, with mild extension since 02/16/2023. Hyperdense blood now surrounds most of the right choroid plexus. Some of this has read distributed from the right frontal horn. But the hemorrhage volume has increased. Minimal extension into the 3rd ventricle (series 2, image 15). But stable ventricle size and configuration. No ventriculomegaly. Left lateral and 4th ventricles remain spared. No transependymal edema. No superimposed midline shift, mass effect, evidence of mass lesion,or evidence of cortically based acute infarction. Patchy bilateral white matter hypodensity is stable. Vascular: No suspicious intracranial vascular hyperdensity. Calcified atherosclerosis at the skull base. Skull: Stable and intact. Sinuses/Orbits: Visualized paranasal sinuses and mastoids are stable and well aerated. Other: Stable orbit and scalp soft tissues. No acute  injury identified. IMPRESSION: 1. Increased volume of right lateral intraventricular hemorrhage since 02/16/2023, now moderate. But still the blood is fairly isolated to that ventricle; minimal extension into the 3rd ventricle. No associated ventriculomegaly or transependymal edema. 2. Underlying chronic small vessel disease. No other acute intracranial abnormality. Electronically Signed   By: Odessa Fleming M.D.   On: 02/20/2023 05:04   CT HEAD WO CONTRAST ( )  Result Date: 02/16/2023 CLINICAL DATA:  Follow-up hemorrhage EXAM: CT HEAD WITHOUT CONTRAST TECHNIQUE: Contiguous axial images were obtained from the base of the skull through the vertex without intravenous contrast. RADIATION DOSE REDUCTION: This exam was performed according to the departmental dose-optimization program which includes automated exposure control, adjustment of the mA and/or kV according to patient size and/or use of iterative reconstruction technique. COMPARISON:  02/15/2023 FINDINGS: Brain: Again seen is the blood within the anterior horn of the right lateral ventricle, unchanged. No new areas of hemorrhage. No hydrocephalus. No acute infarct. No mass effect or midline shift. Vascular: No hyperdense vessel or unexpected calcification. Skull: No acute calvarial abnormality. Sinuses/Orbits: No acute findings Other: None IMPRESSION: Stable hemorrhage in the anterior horn of the right lateral ventricle. Electronically Signed   By: Charlett Nose M.D.   On: 02/16/2023 03:49   CT ANGIO HEAD NECK W WO CM  Result Date: 02/16/2023 CLINICAL DATA:  Hemorrhage in the right frontal horn on CT head EXAM: CT ANGIOGRAPHY HEAD AND NECK WITH AND WITHOUT CONTRAST TECHNIQUE: Multidetector CT imaging of the head and neck was performed using the standard protocol during bolus administration of intravenous contrast. Multiplanar CT image reconstructions and MIPs were obtained to evaluate the vascular anatomy. Carotid stenosis measurements (when applicable) are  obtained utilizing NASCET criteria, using the distal internal carotid diameter as the denominator. RADIATION DOSE REDUCTION: This exam was performed according to the departmental dose-optimization program which includes automated exposure control, adjustment of the mA and/or kV according to patient size and/or use of iterative reconstruction technique. CONTRAST:  75mL OMNIPAQUE IOHEXOL 350 MG/ML SOLN COMPARISON:  No prior CTA available, correlation is made with 08/10/2021 MRA head and neck and 02/05/2023 CT head FINDINGS: CT HEAD FINDINGS For noncontrast findings, please see same day CT head. No evidence of active extravasation in right frontal horn hemorrhage. Multiple dural based enhancing lesions, the largest of which measures up to 9 mm (series 7, image 69 and 107), with smaller lesions along the left cerebral convexity (series 7, images 86, 96, and 98) and falx (series 7, image 74, for example), which correlate with calcified lesions on  the prior head CT, most likely small meningiomas, without significant mass effect. CTA NECK FINDINGS Aortic arch: Standard branching. Imaged portion shows no evidence of aneurysm or dissection. No significant stenosis of the major arch vessel origins. Mild aortic atherosclerosis. Right carotid system: 50% stenosis in the proximal right ICA, just distal to the bifurcation (series 7, image 221). No evidence of dissection. Left carotid system: 50% stenosis in the proximal left ICA, just distal to the bifurcation (series 7, image 210). No evidence of dissection. Vertebral arteries: Severe stenosis at the origin of right vertebral artery (series 9, image 62). The right vertebral artery is otherwise patent to the skull base. The left vertebral artery is patent from its origin to the skull base without significant stenosis. No evidence of dissection. Skeleton: No acute osseous abnormality. Degenerative changes in the cervical spine. Edentulous with calcifications but Other neck: No  acute finding. Upper chest: No focal pulmonary opacity or pleural effusion. Review of the MIP images confirms the above findings CTA HEAD FINDINGS Anterior circulation: Both internal carotid arteries are patent to the termini, without significant stenosis. A1 segments patent, hypoplastic on the right. Normal anterior communicating artery. Anterior cerebral arteries are patent to their distal aspects without significant stenosis. No M1 stenosis or occlusion. MCA branches perfused to their distal aspects without significant stenosis. Posterior circulation: The bilateral V4 segments primarily supply the PICAs, with diminutive distal V4 segments patent to vertebrobasilar junction. Basilar patent to its distal aspect without significant stenosis. Superior cerebellar arteries patent proximally. Patent, hypoplastic P1 segments. Near fetal origin of the bilateral PCAs from the posterior communicating arteries. PCAs perfused to their distal aspects without significant stenosis. Venous sinuses: As permitted by contrast timing, patent. Anatomic variants: Near fetal origin of the bilateral PCAs. No evidence of aneurysm or vascular malformation. Review of the MIP images confirms the above findings IMPRESSION: 1. No evidence of active extravasation in the right frontal horn hemorrhage. 2. No intracranial large vessel occlusion or significant stenosis. 3. 50% stenosis in the proximal bilateral ICAs, just distal to the bifurcations. 4. Severe stenosis at the origin of the right vertebral artery. 5. Multiple dural based enhancing lesions, the largest of which measures up to 9 mm, which correlate with calcified lesions on the prior head CT, most likely small meningiomas, without significant mass effect. 6. Aortic atherosclerosis. Aortic Atherosclerosis (ICD10-I70.0). Electronically Signed   By: Wiliam Ke M.D.   On: 02/16/2023 00:35   CT HEAD WO CONTRAST ( )  Result Date: 02/15/2023 CLINICAL DATA:  Recent fall, with  subsequent syncopal event, headache and neck pain EXAM: CT HEAD WITHOUT CONTRAST CT CERVICAL SPINE WITHOUT CONTRAST TECHNIQUE: Multidetector CT imaging of the head and cervical spine was performed following the standard protocol without intravenous contrast. Multiplanar CT image reconstructions of the cervical spine were also generated. RADIATION DOSE REDUCTION: This exam was performed according to the departmental dose-optimization program which includes automated exposure control, adjustment of the mA and/or kV according to patient size and/or use of iterative reconstruction technique. COMPARISON:  02/12/2023 CT head and cervical spine FINDINGS: CT HEAD FINDINGS Brain: Small amount of hyperdense hemorrhage in the right frontal horn (series 2, image 11), which is new from the 02/12/2023 exam, of indeterminate etiology. No hydrocephalus. No evidence of parenchymal hemorrhage, subdural hemorrhage, or subarachnoid hemorrhage. No acute infarct, mass, mass effect, or midline shift. Vascular: No hyperdense vessel. Skull: Negative for fracture or focal lesion. Sinuses/Orbits: No acute finding. Other: The mastoid air cells are well aerated. CT CERVICAL SPINE FINDINGS  Alignment: No traumatic listhesis. Straightening and mild reversal of the normal cervical lordosis. Trace anterolisthesis of C3 on C4 and trace retrolisthesis of C5 on C6 and C6 on C7. Skull base and vertebrae: No acute fracture or suspicious osseous lesion. Soft tissues and spinal canal: No prevertebral fluid or swelling. No visible canal hematoma. Disc levels: Degenerative changes in the cervical spine.No high-grade spinal canal stenosis. Upper chest: For findings in the thorax, please see same day CT chest. IMPRESSION: 1. Small amount of hyperdense hemorrhage in the right frontal horn, which is new from the 02/12/2023 exam, of indeterminate etiology. No hydrocephalus. 2. No acute fracture or traumatic listhesis in the cervical spine. These results were  called by telephone at the time of interpretation on 02/15/2023 at 10:07 pm to provider [facility] , who verbally acknowledged these results. Electronically Signed   By: Wiliam Ke M.D.   On: 02/15/2023 22:07   CT Cervical Spine Wo Contrast  Result Date: 02/15/2023 CLINICAL DATA:  Recent fall, with subsequent syncopal event, headache and neck pain EXAM: CT HEAD WITHOUT CONTRAST CT CERVICAL SPINE WITHOUT CONTRAST TECHNIQUE: Multidetector CT imaging of the head and cervical spine was performed following the standard protocol without intravenous contrast. Multiplanar CT image reconstructions of the cervical spine were also generated. RADIATION DOSE REDUCTION: This exam was performed according to the departmental dose-optimization program which includes automated exposure control, adjustment of the mA and/or kV according to patient size and/or use of iterative reconstruction technique. COMPARISON:  02/12/2023 CT head and cervical spine FINDINGS: CT HEAD FINDINGS Brain: Small amount of hyperdense hemorrhage in the right frontal horn (series 2, image 11), which is new from the 02/12/2023 exam, of indeterminate etiology. No hydrocephalus. No evidence of parenchymal hemorrhage, subdural hemorrhage, or subarachnoid hemorrhage. No acute infarct, mass, mass effect, or midline shift. Vascular: No hyperdense vessel. Skull: Negative for fracture or focal lesion. Sinuses/Orbits: No acute finding. Other: The mastoid air cells are well aerated. CT CERVICAL SPINE FINDINGS Alignment: No traumatic listhesis. Straightening and mild reversal of the normal cervical lordosis. Trace anterolisthesis of C3 on C4 and trace retrolisthesis of C5 on C6 and C6 on C7. Skull base and vertebrae: No acute fracture or suspicious osseous lesion. Soft tissues and spinal canal: No prevertebral fluid or swelling. No visible canal hematoma. Disc levels: Degenerative changes in the cervical spine.No high-grade spinal canal stenosis. Upper chest: For  findings in the thorax, please see same day CT chest. IMPRESSION: 1. Small amount of hyperdense hemorrhage in the right frontal horn, which is new from the 02/12/2023 exam, of indeterminate etiology. No hydrocephalus. 2. No acute fracture or traumatic listhesis in the cervical spine. These results were called by telephone at the time of interpretation on 02/15/2023 at 10:07 pm to provider [facility] , who verbally acknowledged these results. Electronically Signed   By: Wiliam Ke M.D.   On: 02/15/2023 22:07   CT Chest Wo Contrast  Result Date: 02/15/2023 CLINICAL DATA:  Chest trauma and pain. EXAM: CT CHEST WITHOUT CONTRAST TECHNIQUE: Multidetector CT imaging of the chest was performed following the standard protocol without IV contrast. RADIATION DOSE REDUCTION: This exam was performed according to the departmental dose-optimization program which includes automated exposure control, adjustment of the mA and/or kV according to patient size and/or use of iterative reconstruction technique. COMPARISON:  Chest CT dated 02/17/2022. FINDINGS: Evaluation of this exam is limited in the absence of intravenous contrast. Cardiovascular: There is no cardiomegaly. Small pericardial effusion. Advanced 3 vessel coronary vascular calcification.  Mild atherosclerotic calcification of the thoracic aorta. No aneurysmal dilatation. The central pulmonary arteries are grossly unremarkable on this noncontrast CT. Mediastinum/Nodes: No hilar or mediastinal adenopathy. The esophagus is grossly unremarkable. No mediastinal fluid collection. Lungs/Pleura: Bibasilar linear atelectasis/scarring. Small left lower lobe subpleural calcified granuloma. No focal consolidation, pleural effusion or pneumothorax. The central airways are patent. Upper Abdomen: A 3 mm nonobstructing right renal upper pole calculus. Probable small cyst in the dome of the liver. Musculoskeletal: Osteopenia. Old compression fracture of superior endplate of L1. No  acute osseous pathology. Acute fractures of the lateral right 8-10th ribs. IMPRESSION: 1. Acute fractures of the lateral right 8-10th ribs. No pneumothorax. 2. Advanced coronary vascular calcification. 3. A 3 mm nonobstructing right renal upper pole calculus. 4.  Aortic Atherosclerosis (ICD10-I70.0). Electronically Signed   By: Elgie Collard M.D.   On: 02/15/2023 22:01   DG Ribs Unilateral W/Chest Right  Result Date: 02/12/2023 CLINICAL DATA:  Fall. EXAM: RIGHT RIBS AND CHEST - 3+ VIEW COMPARISON:  09/18/2022. FINDINGS: Bilateral lung fields are clear. Bilateral costophrenic angles are clear. Normal cardio-mediastinal silhouette. There is acute minimally displaced fracture of the posterolateral right tenth rib. No other acute displaced rib fracture. The soft tissues are within normal limits. IMPRESSION: *There is a minimally displaced fracture of the posterolateral right tenth rib. Otherwise no acute cardiopulmonary abnormality Electronically Signed   By: Jules Schick M.D.   On: 02/12/2023 16:25   CT Head Wo Contrast  Result Date: 02/12/2023 CLINICAL DATA:  Head trauma, minor (Age >= 65y); Neck trauma (Age >= 65y) EXAM: CT HEAD WITHOUT CONTRAST CT CERVICAL SPINE WITHOUT CONTRAST TECHNIQUE: Multidetector CT imaging of the head and cervical spine was performed following the standard protocol without intravenous contrast. Multiplanar CT image reconstructions of the cervical spine were also generated. RADIATION DOSE REDUCTION: This exam was performed according to the departmental dose-optimization program which includes automated exposure control, adjustment of the mA and/or kV according to patient size and/or use of iterative reconstruction technique. COMPARISON:  None Available. FINDINGS: CT HEAD FINDINGS Brain: No evidence of acute infarction, hemorrhage, hydrocephalus, extra-axial collection or mass lesion/mass effect. Vascular: No hyperdense vessel. Skull: No acute fracture. Sinuses/Orbits: Clear  sinuses.  No acute orbital finding. Other: No mastoid effusions. CT CERVICAL SPINE FINDINGS Alignment: Mild, likely degenerative retrolisthesis at C5-C6 and C6-C7. Otherwise, no substantial sagittal subluxation. Skull base and vertebrae: No evidence of acute fracture. Vertebral body heights are maintained. Soft tissues and spinal canal: No prevertebral fluid or swelling. No visible canal hematoma. Disc levels: Moderate multilevel degenerative change, greatest at C5-C6 and C6-C7. Upper chest: Visualized lung apices are clear. IMPRESSION: No evidence of acute abnormality intracranially or in the cervical spine. Electronically Signed   By: Feliberto Harts M.D.   On: 02/12/2023 12:56   CT Cervical Spine Wo Contrast  Result Date: 02/12/2023 CLINICAL DATA:  Head trauma, minor (Age >= 65y); Neck trauma (Age >= 65y) EXAM: CT HEAD WITHOUT CONTRAST CT CERVICAL SPINE WITHOUT CONTRAST TECHNIQUE: Multidetector CT imaging of the head and cervical spine was performed following the standard protocol without intravenous contrast. Multiplanar CT image reconstructions of the cervical spine were also generated. RADIATION DOSE REDUCTION: This exam was performed according to the departmental dose-optimization program which includes automated exposure control, adjustment of the mA and/or kV according to patient size and/or use of iterative reconstruction technique. COMPARISON:  None Available. FINDINGS: CT HEAD FINDINGS Brain: No evidence of acute infarction, hemorrhage, hydrocephalus, extra-axial collection or mass lesion/mass effect. Vascular: No hyperdense  vessel. Skull: No acute fracture. Sinuses/Orbits: Clear sinuses.  No acute orbital finding. Other: No mastoid effusions. CT CERVICAL SPINE FINDINGS Alignment: Mild, likely degenerative retrolisthesis at C5-C6 and C6-C7. Otherwise, no substantial sagittal subluxation. Skull base and vertebrae: No evidence of acute fracture. Vertebral body heights are maintained. Soft tissues  and spinal canal: No prevertebral fluid or swelling. No visible canal hematoma. Disc levels: Moderate multilevel degenerative change, greatest at C5-C6 and C6-C7. Upper chest: Visualized lung apices are clear. IMPRESSION: No evidence of acute abnormality intracranially or in the cervical spine. Electronically Signed   By: Feliberto Harts M.D.   On: 02/12/2023 12:56    ECHO ordered  TELEMETRY reviewed by me 03/09/2023: Sinus rhythm rate 90s  EKG reviewed by me: NSR rate 99 bpm, nonischemic  Data reviewed by me 03/09/2023: last 24h vitals tele labs imaging I/O ED provider Weiss, admission H&P  Principal Problem:   Diabetic ketoacidosis without coma associated with type 1 diabetes mellitus (HCC) Active Problems:   Essential (primary) hypertension   Acute renal failure superimposed on stage 3a chronic kidney disease (HCC)   Anemia of chronic kidney failure, stage 3 (moderate) (HCC)   Acute metabolic encephalopathy   Multiple myeloma (HCC)   Dysautonomia orthostatic hypotension with recurrent syncope   History of intraventricular hemorrhage following syncopal event 02/15/2023   Myoclonus   Elevated troponin   Atrial fibrillation (HCC)   Chronic anticoagulation   NSTEMI (non-ST elevated myocardial infarction) (HCC)    ASSESSMENT AND PLAN:  ALESSANDER TA is a 72 y.o. male  with a past medical history of uncontrolled type 1 diabetes, multiple myeloma, CKD 3, history of CVA, HTN, HLD, cocaine abuse  who presented to the ED on 03/08/2023 for AMS. Found to be in DKA, troponins checked and elevated. Cardiology was consulted for further evaluation.   # Diabetic ketoacidosis # Type 1 diabetes, poorly controlled # Acute metabolic encephalopathy # Demand ischemia  Patient presented with altered mental status found to have glucose in the 400s, this reportedly had been elevated at his care facility prior to admission.  Troponin checked and trended for 438 > 506.  He is without chest pain or other  anginal symptoms. -DKA management as per primary. -Can DC heparin infusion. -Troponin elevation most consistent with demand/supply mismatch and not ACS in the setting of AKI, diabetic ketoacidosis and given no chest pain or anginal symptoms. -Echo ordered.  No plan for further cardiac diagnostics at this time.  # AKI on CKD stage III Patient with known CKD with baseline creatinine around 1.5 presenting with creatinine of 4.24. -Agree with IV hydration, further management per primary.  # Paroxysmal atrial fibrillation Patient with history of PAF on Eliquis but this has recently been held given intraventricular hemorrhage. -Anticoagulation with heparin at this time.  Would recommend restarting Eliquis when this is discontinued. -Not on any rate control medications due to baseline hypotension.   This patient's plan of care was discussed and created with Dr. Juliann Pares and he is in agreement.  Signed: Gale Journey, PA-C  03/09/2023, 11:28 AM [facility]

## 2023-03-09 NOTE — Assessment & Plan Note (Signed)
Hemoglobin at baseline at 8.2

## 2023-03-09 NOTE — H&P (Signed)
History and Physical    Patient: Nicholas Weiss UJW:[id number] DOB: 07-28-1950 DOA: 03/08/2023 DOS: the patient was seen and examined on 03/09/2023 PCP: Marisue Ivan, MD  Patient coming from: SNF  Chief Complaint:  Chief Complaint  Patient presents with   Hyperglycemia   Altered Mental Status    HPI: RHASHAD AXLEY is a 72 y.o. male with medical history significant for hypertension, prior CVA, multiple myeloma, CKD 3B, paroxysmal A-fib, type 1 diabetes mellitus, A-fib on Eliquis, myoclonus on Keppra, dysautonomia/orthostatic hypotension, with recurrent syncope, hospitalized from 11/18 to 02/24/2023 with intraventricular hemorrhage related to a syncopal event who presents by EMS with altered mental status noted when his partner visited him at his facility.  EMS reported that patient had been altered for the past 3 days and his blood glucose readings were high. ED course and data review: Vitals within normal limits Workup notable for the following: Blood glucose 413 with anion gap of 19, bicarb 18, venous pH 7.3 Creatinine 4.11, up from baseline of 1.5 to just 2 weeks prior Hemoglobin at baseline at 8.2 Urinalysis with ketones but no infection Troponin 438--506 EKG, personally viewed and interpreted showing sinus at 99 with nonspecific ST-T wave changes CT head showing interval resolution of previously seen intraventricular hemorrhage and no acute intracranial pathology   Patient started on insulin drip per Endo tool Hospitalist consulted for admission.     Past Medical History:  Diagnosis Date   Acute metabolic encephalopathy 12/02/2020   DKA (diabetic ketoacidoses) 04/06/2016   Hypercholesteremia    Hypertension    Past Surgical History:  Procedure Laterality Date   AMPUTATION TOE Left 04/25/2022   Procedure: LEFT GREAT TOE AMPUTATION AND LEFT PARTIAL 2ND TOE AMPUTATION;  Surgeon: Felecia Shelling, DPM;  Location: [facility] ORS;  Service: Podiatry;  Laterality: Left;    COLONOSCOPY WITH PROPOFOL N/A 02/01/2020   Procedure: COLONOSCOPY WITH PROPOFOL;  Surgeon: Toney Reil, MD;  Location: [facility] ENDOSCOPY;  Service: Gastroenterology;  Laterality: N/A;   ESOPHAGOGASTRODUODENOSCOPY  02/01/2020   Procedure: ESOPHAGOGASTRODUODENOSCOPY (EGD);  Surgeon: Toney Reil, MD;  Location: [facility] ENDOSCOPY;  Service: Gastroenterology;;   ESOPHAGOGASTRODUODENOSCOPY (EGD) WITH PROPOFOL N/A 06/07/2022   Procedure: ESOPHAGOGASTRODUODENOSCOPY (EGD) WITH PROPOFOL;  Surgeon: Kerin Salen, MD;  Location: [facility] ENDOSCOPY;  Service: Gastroenterology;  Laterality: N/A;   KNEE SURGERY Right    Torn meniscus   KNEE SURGERY Left    Social History:  reports that he has never smoked. He has never used smokeless tobacco. He reports that he does not currently use alcohol. He reports current drug use. Drugs: Marijuana and "Crack" cocaine.  Allergies  Allergen Reactions   Penicillins Other (See Comments)    Childhood allergy -tolerated amoxil 03/2022 Unknown reaction Has patient had a PCN reaction causing immediate rash, facial/tongue/throat swelling, SOB or lightheadedness with hypotension: No Has patient had a PCN reaction causing severe rash involving mucus membranes or skin necrosis: No Has patient had a PCN reaction that required hospitalization No Has patient had a PCN reaction occurring within the last 10 years: No If all of the above answers are "NO", then may proceed with Cephalosporin use.     Family History  Problem Relation Age of Onset   Heart attack Father    Hypertension Sister    Cancer Sister     Prior to Admission medications   Medication Sig Start Date End Date Taking? Authorizing Provider  acetaminophen (TYLENOL) 500 MG tablet Take 1,000 mg by mouth daily as needed for mild pain  or moderate pain.    [provider]  apixaban (ELIQUIS) 5 MG TABS tablet Take 1 tablet (5 mg total) by mouth 2 (two) times daily. Start Eliquis after 2 weeks which will be  03/02/2023 03/02/23   Gillis Santa, MD  ascorbic acid (VITAMIN C) 500 MG tablet Take 1 tablet (500 mg total) by mouth daily. 04/09/22   Arnetha Courser, MD  atorvastatin (LIPITOR) 80 MG tablet Take 1 tablet (80 mg total) by mouth at bedtime. Hold while taking Paxlovid 04/08/22   Arnetha Courser, MD  bisacodyl 5 MG EC tablet Take 1 tablet (5 mg total) by mouth at bedtime. Skip the dose if no constipation 02/24/23   Gillis Santa, MD  fludrocortisone (FLORINEF) 0.1 MG tablet Take 1 tablet (0.1 mg total) by mouth daily. Skip the dose if SBP >130 02/25/23   Gillis Santa, MD  hydrALAZINE (APRESOLINE) 25 MG tablet Take 1 tablet (25 mg total) by mouth 3 (three) times daily as needed (SBP >160). 02/24/23   Gillis Santa, MD  insulin degludec (TRESIBA FLEXTOUCH) 100 UNIT/ML FlexTouch Pen Inject 5 Units into the skin daily. Home med. 11/16/22   Darlin Priestly, MD  levETIRAcetam (KEPPRA) 500 MG tablet Take 1 tablet (500 mg total) by mouth 2 (two) times daily. Patient not taking: Reported on 02/16/2023 01/15/23   Henreitta Leber, MD  midodrine (PROAMATINE) 10 MG tablet Take 1 tablet (10 mg total) by mouth 3 (three) times daily with meals. Hold if SBP >140 02/24/23   Gillis Santa, MD  Multiple Vitamin (MULTIVITAMIN WITH MINERALS) TABS tablet Take 1 tablet by mouth daily. 04/09/22   Arnetha Courser, MD  NOVOLOG 100 UNIT/ML injection Sliding Scale: Glucose 70 - 120: 0 units  Glucose 121 - 150: 1 unit  Glucose 151 - 200: 2 units Glucose 201 - 250: 3 units  Glucose 251 - 300: 5 units  Glucose 301 - 350: 7 units  Glucose 351 - 400: 9 units  Glucose > 400: call your docotr Patient not taking: Reported on 02/16/2023 06/12/22   Jerald Kief, MD  ondansetron (ZOFRAN) 8 MG tablet Take 8 mg by mouth daily as needed for nausea or vomiting.    [provider]  polyethylene glycol (MIRALAX) 17 g packet Take 17 g by mouth 2 (two) times daily. Skip the dose if no constipation 02/24/23   Gillis Santa, MD  QUEtiapine  (SEROQUEL) 25 MG tablet Take 1 tablet (25 mg total) by mouth every evening. 02/24/23   Gillis Santa, MD  traMADol (ULTRAM) 50 MG tablet Take 1 tablet (50 mg total) by mouth every 6 (six) hours as needed. 02/24/23 02/24/24  Gillis Santa, MD  traZODone (DESYREL) 50 MG tablet Take 0.5 tablets (25 mg total) by mouth at bedtime as needed for sleep. 02/24/23   Gillis Santa, MD  Vitamin D, Ergocalciferol, (DRISDOL) 1.25 MG (50000 UNIT) CAPS capsule Take 1 capsule (50,000 Units total) by mouth every 7 (seven) days. 02/25/23 05/26/23  Gillis Santa, MD    Physical Exam: Vitals:   03/08/23 2116 03/08/23 2120 03/08/23 2300  BP:  135/71   Pulse:  97   Resp:  18   Temp:  98.1 F (36.7 C)   TempSrc:  Oral   SpO2: 97% 97%   Weight:   62.4 kg   Physical Exam Vitals and nursing note reviewed.  Constitutional:      General: He is not in acute distress.    Appearance: He is underweight.  HENT:  Head: Normocephalic and atraumatic.  Cardiovascular:     Rate and Rhythm: Normal rate and regular rhythm.     Heart sounds: Normal heart sounds.  Pulmonary:     Effort: Pulmonary effort is normal.     Breath sounds: Normal breath sounds.  Abdominal:     Palpations: Abdomen is soft.     Tenderness: There is no abdominal tenderness.  Neurological:     General: No focal deficit present.     Mental Status: He is lethargic.     Labs on Admission: I have personally reviewed following labs and imaging studies  CBC: Recent Labs  Lab 03/08/23 2126  WBC 8.2  HGB 8.2*  HCT 26.5*  MCV 99.6  PLT 186   Basic Metabolic Panel: Recent Labs  Lab 03/08/23 2126 03/08/23 2257  NA 137 139  K 4.5 4.5  CL 102 102  CO2 13* 18*  GLUCOSE 493* 413*  BUN 69* 72*  CREATININE 4.24* 4.11*  CALCIUM 8.4* 8.7*   GFR: Estimated Creatinine Clearance: 15 mL/min (A) (by C-G formula based on SCr of 4.11 mg/dL (H)). Liver Function Tests: Recent Labs  Lab 03/08/23 2257  AST 31  ALT 26  ALKPHOS 89  BILITOT  1.2*  PROT 9.0*  ALBUMIN 3.8   No results for input(s): "LIPASE", "AMYLASE" in the last 168 hours. No results for input(s): "AMMONIA" in the last 168 hours. Coagulation Profile: No results for input(s): "INR", "PROTIME" in the last 168 hours. Cardiac Enzymes: No results for input(s): "CKTOTAL", "CKMB", "CKMBINDEX", "TROPONINI" in the last 168 hours. BNP (last 3 results) No results for input(s): "PROBNP" in the last 8760 hours. HbA1C: No results for input(s): "HGBA1C" in the last 72 hours. CBG: Recent Labs  Lab 03/08/23 2119  GLUCAP 454*   Lipid Profile: No results for input(s): "CHOL", "HDL", "LDLCALC", "TRIG", "CHOLHDL", "LDLDIRECT" in the last 72 hours. Thyroid Function Tests: No results for input(s): "TSH", "T4TOTAL", "FREET4", "T3FREE", "THYROIDAB" in the last 72 hours. Anemia Panel: No results for input(s): "VITAMINB12", "FOLATE", "FERRITIN", "TIBC", "IRON", "RETICCTPCT" in the last 72 hours. Urine analysis:    Component Value Date/Time   COLORURINE YELLOW (A) 03/08/2023 2257   APPEARANCEUR HAZY (A) 03/08/2023 2257   LABSPEC 1.016 03/08/2023 2257   PHURINE 5.0 03/08/2023 2257   GLUCOSEU >=500 (A) 03/08/2023 2257   HGBUR NEGATIVE 03/08/2023 2257   BILIRUBINUR NEGATIVE 03/08/2023 2257   KETONESUR 20 (A) 03/08/2023 2257   PROTEINUR 30 (A) 03/08/2023 2257   NITRITE NEGATIVE 03/08/2023 2257   LEUKOCYTESUR NEGATIVE 03/08/2023 2257    Radiological Exams on Admission: CT Head Wo Contrast  Result Date: 03/08/2023 CLINICAL DATA:  Follow-up recent intracranial hemorrhage. EXAM: CT HEAD WITHOUT CONTRAST TECHNIQUE: Contiguous axial images were obtained from the base of the skull through the vertex without intravenous contrast. RADIATION DOSE REDUCTION: This exam was performed according to the departmental dose-optimization program which includes automated exposure control, adjustment of the mA and/or kV according to patient size and/or use of iterative reconstruction technique.  COMPARISON:  Head CT dated 02/20/2023. FINDINGS: Evaluation of this exam is limited due to motion artifact. Brain: Mild age-related atrophy and moderate chronic microvascular ischemic changes. Interval resolution of the previously seen intraventricular hemorrhage. No acute intracranial hemorrhage. No mass effect or midline shift. No extra-axial fluid collection. Vascular: No hyperdense vessel or unexpected calcification. Skull: Normal. Negative for fracture or focal lesion. Sinuses/Orbits: No acute finding. Other: None IMPRESSION: 1. No acute intracranial pathology. 2. Interval resolution of the previously seen intraventricular  hemorrhage. 3. Mild age-related atrophy and moderate chronic microvascular ischemic changes. Electronically Signed   By: Elgie Collard M.D.   On: 03/08/2023 22:24     Data Reviewed: Relevant notes from primary care and specialist visits, past discharge summaries as available in EHR, including Care Everywhere. Prior diagnostic testing as pertinent to current admission diagnoses Updated medications and problem lists for reconciliation ED course, including vitals, labs, imaging, treatment and response to treatment Triage notes, nursing and pharmacy notes and ED provider's notes Notable results as noted in HPI   Assessment and Plan: * Diabetic ketoacidosis without coma associated with type 1 diabetes mellitus (HCC) No definite triggers seen for DKA Workup appears noninfectious, head CT normal Troponins elevated but suspecting demand ischemia.  No prior history of CAD Will get chest x-ray Continue to trend troponins IV insulin, BMP checks, potassium repletion per Endo tool Transition to subcu when gap is closed  NSTEMI (non-ST elevated myocardial infarction) (HCC) Suspect demand ischemia secondary to DKA as well as elevation from worsening renal function Patient has no chest pain and EKG nonacute Continue to trend troponins Continuous cardiac monitoring Will get echo  to evaluate for Focal wall motion abnormality ---> Trop 438-->506.Marland KitchenMarland KitchenWill start heparin infusion And consult cardiology   Acute metabolic encephalopathy Secondary to DKA CT head nonacute, showing resolution of prior intraventricular hemorrhage Neurologic checks Fall and aspiration precautions  Acute renal failure superimposed on stage 3a chronic kidney disease (HCC) Creatinine over 4.11 up from baseline of 1.5 Expecting improvement with IV fluid resuscitation Monitor renal function and avoid nephrotoxins  History of intraventricular hemorrhage following syncopal event 02/15/2023 Restarted on Eliquis 12/3 CT head showing resolution of prior bleed  Atrial fibrillation (HCC) Chronic anticoagulation Holding Eliquis due to recent intraventricular hemorrhage(was cleared to resume on 12/3) uncertain whether resumed Not on beta-blocker due to chronic hypotension  Myoclonus Continue Keppra  Dysautonomia orthostatic hypotension with recurrent syncope Most recent syncopal episode 11/18 Continue midodrine Fall precautions Up with assistance only  Anemia of chronic kidney failure, stage 3 (moderate) (HCC) Hemoglobin at baseline at 8.2  Essential (primary) hypertension Holding hydralazine as patient also on midodrine for orthostatic hypotension  Multiple myeloma (HCC) No acute disease suspected    DVT prophylaxis: Lovenox  Consults: cardiology chmg  Advance Care Planning:   Code Status: Prior   Family Communication: none  Disposition Plan: Back to previous home environment  Severity of Illness: The appropriate patient status for this patient is INPATIENT. Inpatient status is judged to be reasonable and necessary in order to provide the required intensity of service to ensure the patient's safety. The patient's presenting symptoms, physical exam findings, and initial radiographic and laboratory data in the context of their chronic comorbidities is felt to place them at high  risk for further clinical deterioration. Furthermore, it is not anticipated that the patient will be medically stable for discharge from the hospital within 2 midnights of admission.   * I certify that at the point of admission it is my clinical judgment that the patient will require inpatient hospital care spanning beyond 2 midnights from the point of admission due to high intensity of service, high risk for further deterioration and high frequency of surveillance required.*  Author: Andris Baumann, MD 03/09/2023 12:09 AM  For on call review [email or web address].

## 2023-03-09 NOTE — Assessment & Plan Note (Signed)
Most recent syncopal episode 11/18 Continue midodrine Fall precautions Up with assistance only

## 2023-03-09 NOTE — Progress Notes (Addendum)
Progress Note   Patient: Nicholas Weiss ZOX:[id number] DOB: Jan 06, 1951 DOA: 03/08/2023     1 DOS: the patient was seen and examined on 03/09/2023   Brief hospital course: Nicholas Weiss is a 72 y.o. male with medical history significant for hypertension, prior CVA, multiple myeloma, CKD 3B, paroxysmal A-fib, type 1 diabetes mellitus, A-fib on Eliquis, myoclonus on Keppra, dysautonomia/orthostatic hypotension, with recurrent syncope, hospitalized from 11/18 to 02/24/2023 with intraventricular hemorrhage related to a syncopal event who presents by EMS with altered mental status noted when his partner visited him at his facility.  EMS reported that patient had been altered for the past 3 days and his blood glucose readings were high.  Patient was found to have elevated troponin as well as labs showing DKA and subsequently admitted for further management.   Assessment and Plan:  Diabetic ketoacidosis without coma associated with type 1 diabetes mellitus (HCC)-resolved Patient presented with labs classic for DKA Currently anion gap is closed Insulin drip changed to subcutaneous insulin Monitor glucose closely    Elevated troponin likely secondary to demand ischemia  Suspect demand ischemia secondary to DKA as well as elevation from worsening renal function Patient has no chest pain and EKG nonacute Continue on telemetry Follow-up with echocardiogram Troponin showing Trop 438-->506 Heparin drip discontinued by cardiologist as this is unlikely NSTEMI I have discussed the plan of care with cardiologist and no invasive cardiac workup being planned at this time     Acute metabolic encephalopathy Secondary to DKA CT head nonacute, showing resolution of prior intraventricular hemorrhage Neurologic checks Fall and aspiration precautions   Acute renal failure superimposed on stage 3a chronic kidney disease (HCC) Creatinine over 4.11 up from baseline of 1.5 Expecting improvement with IV fluid  resuscitation Monitor renal function and avoid nephrotoxins Renal function improving if this were to worsen to consider nephrologist consultation   History of intraventricular hemorrhage following syncopal event 02/15/2023 Restarted on Eliquis 12/3 CT head showing resolution of prior bleed   Atrial fibrillation (HCC) Chronic anticoagulation Holding Eliquis due to recent intraventricular hemorrhage(was cleared to resume on 12/3) uncertain whether resumed Not on beta-blocker due to chronic hypotension   Myoclonus Continue Keppra   Dysautonomia orthostatic hypotension with recurrent syncope Most recent syncopal episode 11/18 Continue midodrine Continue fall precautions Up with assistance only Consider PT OT consult from tomorrow when clinically improved   Anemia of chronic kidney failure, stage 3 (moderate) (HCC) Hemoglobin at baseline at 8.2   Essential (primary) hypertension Holding hydralazine as patient also on midodrine for orthostatic hypotension   Multiple myeloma (HCC) No acute disease suspected       DVT prophylaxis: Lovenox   Consults: Cardiology   Advance Care Planning:   Code Status: Prior    Family Communication: none   Disposition Plan: Back to previous home environment  Subjective:  Patient seen and examined at bedside this morning Appears lethargic Lab review shows patient is currently out of DKA Denies nausea vomiting abdominal pain or chest pain  Physical Exam:  Vitals and nursing note reviewed.  Constitutional:      General: He is not in acute distress.    Appearance: He is underweight.  HENT:     Head: Normocephalic and atraumatic.  Cardiovascular:     Rate and Rhythm: Normal rate and regular rhythm.     Heart sounds: Normal heart sounds.  Pulmonary:     Effort: Pulmonary effort is normal.     Breath sounds: Normal breath sounds.  Abdominal:  Palpations: Abdomen is soft.     Tenderness: There is no abdominal tenderness.   Neurological: Lethargic with some confusion   Vitals:   03/09/23 0630 03/09/23 0800 03/09/23 0900 03/09/23 1230  BP: (!) 151/83 (!) 143/84 (!) 167/155 (!) 145/73  Pulse: 87 89 88 93  Resp: 10 17 14 13   Temp:      TempSrc:      SpO2: 100% 100% 100% 100%  Weight:        Data Reviewed: I have reviewed patient's CT scan of the brain that showed no acute intracranial pathology I reviewed patient's chest x-ray that did not show any acute intrapulmonary pathology    Latest Ref Rng & Units 03/09/2023    7:14 AM 03/09/2023    2:43 AM 03/08/2023   10:57 PM  BMP  Glucose 70 - 99 mg/dL 536  644  034   BUN 8 - 23 mg/dL 66  68  72   Creatinine 0.61 - 1.24 mg/dL 7.42  5.95  6.38   Sodium 135 - 145 mmol/L 141  142  139   Potassium 3.5 - 5.1 mmol/L 4.2  3.8  4.5   Chloride 98 - 111 mmol/L 109  110  102   CO2 22 - 32 mmol/L 24  25  18    Calcium 8.9 - 10.3 mg/dL 8.3  8.3  8.7        Latest Ref Rng & Units 03/08/2023    9:26 PM 02/24/2023    4:47 AM 02/23/2023    5:49 AM  CBC  WBC 4.0 - 10.5 K/uL 8.2  2.8  3.9   Hemoglobin 13.0 - 17.0 g/dL 8.2  8.3  7.9   Hematocrit 39.0 - 52.0 % 26.5  25.6  24.4   Platelets 150 - 400 K/uL 186  232  228     Family Communication: No family at bedside  Disposition: Status is: Inpatient  Time spent: 26 minutes  Author: Loyce Dys, MD 03/09/2023 1:42 PM  For on call review [email or web address].

## 2023-03-09 NOTE — Assessment & Plan Note (Signed)
Creatinine over 4.11 up from baseline of 1.5 Expecting improvement with IV fluid resuscitation Monitor renal function and avoid nephrotoxins

## 2023-03-09 NOTE — Assessment & Plan Note (Addendum)
No definite triggers seen for DKA Workup appears noninfectious, head CT normal Troponins elevated but suspecting demand ischemia.  No prior history of CAD Will get chest x-ray Continue to trend troponins IV insulin, BMP checks, potassium repletion per Endo tool Transition to subcu when gap is closed

## 2023-03-10 ENCOUNTER — Inpatient Hospital Stay
Admit: 2023-03-10 | Discharge: 2023-03-10 | Disposition: A | Discharge status: Home / Self Care | Payer: 59 | Attending: Student | Admitting: Student

## 2023-03-10 DIAGNOSIS — I440 Atrioventricular block, first degree: Secondary | ICD-10-CM | POA: Diagnosis not present

## 2023-03-10 DIAGNOSIS — E1010 Type 1 diabetes mellitus with ketoacidosis without coma: Secondary | ICD-10-CM | POA: Diagnosis not present

## 2023-03-10 LAB — CBC WITH DIFFERENTIAL/PLATELET
Abs Immature Granulocytes: 0.01 10*3/uL (ref 0.00–0.07)
Basophils Absolute: 0 10*3/uL (ref 0.0–0.1)
Basophils Relative: 0 %
Eosinophils Absolute: 0 10*3/uL (ref 0.0–0.5)
Eosinophils Relative: 0 %
HCT: 21.5 % — ABNORMAL LOW (ref 39.0–52.0)
Hemoglobin: 7 g/dL — ABNORMAL LOW (ref 13.0–17.0)
Immature Granulocytes: 0 %
Lymphocytes Relative: 9 %
Lymphs Abs: 0.4 10*3/uL — ABNORMAL LOW (ref 0.7–4.0)
MCH: 30.3 pg (ref 26.0–34.0)
MCHC: 32.6 g/dL (ref 30.0–36.0)
MCV: 93.1 fL (ref 80.0–100.0)
Monocytes Absolute: 0.4 10*3/uL (ref 0.1–1.0)
Monocytes Relative: 8 %
Neutro Abs: 3.9 10*3/uL (ref 1.7–7.7)
Neutrophils Relative %: 83 %
Platelets: 225 10*3/uL (ref 150–400)
RBC: 2.31 MIL/uL — ABNORMAL LOW (ref 4.22–5.81)
RDW: 15.9 % — ABNORMAL HIGH (ref 11.5–15.5)
WBC: 4.7 10*3/uL (ref 4.0–10.5)
nRBC: 0 % (ref 0.0–0.2)

## 2023-03-10 LAB — ECHOCARDIOGRAM COMPLETE
AR max vel: 3.01 cm2
AV Area VTI: 3.29 cm2
AV Area mean vel: 3.57 cm2
AV Mean grad: 2 mm[Hg]
AV Peak grad: 4.1 mm[Hg]
Ao pk vel: 1.01 m/s
Area-P 1/2: 7.16 cm2
Height: 70 in
MV VTI: 2.14 cm2
S' Lateral: 2.4 cm
Weight: 2208.13 [oz_av]

## 2023-03-10 LAB — GLUCOSE, CAPILLARY
Glucose-Capillary: 145 mg/dL — ABNORMAL HIGH (ref 70–99)
Glucose-Capillary: 25 mg/dL — CL (ref 70–99)
Glucose-Capillary: 30 mg/dL — CL (ref 70–99)
Glucose-Capillary: 117 mg/dL — ABNORMAL HIGH (ref 70–99)
Glucose-Capillary: 25 mg/dL — CL (ref 70–99)
Glucose-Capillary: <10 mg/dL — CL (ref 70–99)
Glucose-Capillary: 90 mg/dL (ref 70–99)
Glucose-Capillary: 39 mg/dL — CL (ref 70–99)
Glucose-Capillary: 120 mg/dL — ABNORMAL HIGH (ref 70–99)
Glucose-Capillary: 11 mg/dL — CL (ref 70–99)
Glucose-Capillary: 53 mg/dL — ABNORMAL LOW (ref 70–99)
Glucose-Capillary: 57 mg/dL — ABNORMAL LOW (ref 70–99)
Glucose-Capillary: 146 mg/dL — ABNORMAL HIGH (ref 70–99)
Glucose-Capillary: 106 mg/dL — ABNORMAL HIGH (ref 70–99)

## 2023-03-10 LAB — BASIC METABOLIC PANEL
Anion gap: 5 (ref 5–15)
BUN: 59 mg/dL — ABNORMAL HIGH (ref 8–23)
CO2: 23 mmol/L (ref 22–32)
Calcium: 8.3 mg/dL — ABNORMAL LOW (ref 8.9–10.3)
Chloride: 117 mmol/L — ABNORMAL HIGH (ref 98–111)
Creatinine, Ser: 2.42 mg/dL — ABNORMAL HIGH (ref 0.61–1.24)
GFR, Estimated: 28 mL/min — ABNORMAL LOW (ref >60)
Glucose, Bld: 96 mg/dL (ref 70–99)
Potassium: 4 mmol/L (ref 3.5–5.1)
Sodium: 145 mmol/L (ref 135–145)

## 2023-03-10 LAB — LIPOPROTEIN A (LPA): Lipoprotein (a): 182.3 nmol/L — ABNORMAL HIGH (ref <75.0)

## 2023-03-10 MED ORDER — LEVETIRACETAM 500 MG PO TABS
500.0000 mg | ORAL_TABLET | Freq: Two times a day (BID) | ORAL | Status: DC
  Administered 2023-03-10 – 2023-03-19 (×19): 500 mg via ORAL
  Filled 2023-03-10 (×19): qty 1

## 2023-03-10 MED ORDER — APIXABAN 5 MG PO TABS
5.0000 mg | ORAL_TABLET | Freq: Two times a day (BID) | ORAL | Status: DC
Start: 2023-03-10 — End: 2023-03-19
  Administered 2023-03-10 – 2023-03-19 (×19): 5 mg via ORAL
  Filled 2023-03-10 (×19): qty 1

## 2023-03-10 MED ORDER — INSULIN GLARGINE-YFGN 100 UNIT/ML ~~LOC~~ SOLN
8.0000 [IU] | Freq: Every day | SUBCUTANEOUS | Status: DC
  Administered 2023-03-11 – 2023-03-15 (×5): 8 [IU] via SUBCUTANEOUS
  Filled 2023-03-10 (×5): qty 0.08

## 2023-03-10 MED ORDER — DEXTROSE 50 % IV SOLN
INTRAVENOUS | Status: AC
  Filled 2023-03-10: qty 50

## 2023-03-10 NOTE — Progress Notes (Signed)
Sitter at bedside helping pt settle down

## 2023-03-10 NOTE — TOC Progression Note (Signed)
Transition of Care [facility]) - Progression Note    Patient Details  Name: Nicholas Weiss MRN: [id number] Date of Birth: June 07, 1950  [facility] of [facility]) CM/SW Contact  Truddie Hidden, RN Phone Number: 03/10/2023, 4:04 PM  Clinical Narrative:    Spoke with patient's friend [name]. She states  prior to patient admitting to [facility] he had used 7 days. Patient used 12 days at [facility]. [name] stated she appealed patient discharge but had not heard back from insurance. [name] is requesting for patient to go to [facility]. She as advised if patient does not have days left a payment would still be expected.   Attempt to contact Tanya in admissions at [facility]. No answer. Left a message.        Expected Discharge Plan and Services                                               Social Determinants of Health (SDOH) Interventions SDOH Screenings   Food Insecurity: Patient Unable To Answer (03/09/2023)  Housing: Patient Unable To Answer (02/16/2023)  Transportation Needs: No Transportation Needs (02/16/2023)  Utilities: Not At Risk (02/16/2023)  Alcohol Screen: Low Risk  (10/02/2020)  Depression (PHQ2-9): Medium Risk (10/02/2020)  Financial Resource Strain: Low Risk  (01/26/2023)   Received from [facility]  Physical Activity: Insufficiently Active (01/01/2020)  Social Connections: Moderately Isolated (01/01/2020)  Stress: No Stress Concern Present (01/01/2020)  Tobacco Use: Low Risk  (03/08/2023)  Recent Concern: Tobacco Use - Medium Risk (02/15/2023)   Received from [facility]    Readmission Risk Interventions    02/16/2023    3:56 PM 08/24/2022   10:57 AM 05/20/2022   11:41 AM  Readmission Risk Prevention Plan  Transportation Screening Complete Complete Complete  Medication Review Oceanographer) Complete Complete Complete  PCP or Specialist appointment within 3-5 days of discharge Complete Complete Complete  HRI or Home Care Consult Complete  Complete Complete  SW Recovery Care/Counseling Consult Not Complete    Palliative Care Screening Not Applicable  Complete  Skilled Nursing Facility Complete Complete Complete

## 2023-03-10 NOTE — Progress Notes (Signed)
Progress Note   Patient: Nicholas Weiss [id number] DOB: 04-12-1950 DOA: 03/08/2023     2 DOS: the patient was seen and examined on 03/10/2023   Brief hospital course: "Nicholas Weiss is a 72 y.o. male with medical history significant for hypertension, prior CVA, multiple myeloma, CKD 3B, paroxysmal A-fib, type 1 diabetes mellitus, A-fib on Eliquis, myoclonus on Keppra, dysautonomia/orthostatic hypotension, with recurrent syncope, hospitalized from 11/18 to 02/24/2023 with intraventricular hemorrhage related to a syncopal event who presents by EMS with altered mental status noted when his partner visited him at his facility.  EMS reported that patient had been altered for the past 3 days and his blood glucose readings were high.  Patient was found to have elevated troponin as well as labs showing DKA and subsequently admitted for further management." See H&P for full HPI on admission & ED course.  Further hospital course and management as outlined below.    Assessment and Plan:  Diabetic ketoacidosis without coma associated with type 1 diabetes mellitus (HCC)-resolved DKA has resolved. Continue subcutaneous insulin & titrate regimen for inpatient goal 140-180 Appreciate diabetes coordinator recommendations Reduce Semglee to 8 units at bedtime Continue sliding scale Novolog AC/HS CBG's   Hypoglycemic episodes Hypoglycemia protocol Reduced insulin as above Encourage PO intake including juice Will start D5 fluids if needed, continue to monitor closely for now    Elevated troponin likely secondary to demand ischemia  Suspect demand ischemia secondary to DKA as well as elevation from worsening renal function Patient has no chest pain and EKG nonacute Continue on telemetry Follow-up with echocardiogram Troponin showing Trop 438-->506 Heparin drip discontinued by cardiologist as this is unlikely NSTEMI I have discussed the plan of care with cardiologist and no invasive cardiac  workup being planned at this time     Acute metabolic encephalopathy Secondary to DKA CT head nonacute, showing resolution of prior intraventricular hemorrhage Neurologic checks Fall and aspiration precautions    Acute renal failure superimposed on stage 3a chronic kidney disease (HCC) Creatinine over 4.11 up from baseline of 1.5 Expecting improvement with IV fluid resuscitation Monitor renal function and avoid nephrotoxins Renal function improving if this were to worsen to consider nephrologist consultation    History of intraventricular hemorrhage following syncopal event 02/15/2023 Restarted on Eliquis 12/3 CT head showing resolution of prior bleed Resumed home Keppra   Atrial fibrillation (HCC) Chronic anticoagulation Resumed on Eliquis Not on beta-blocker due to chronic hypotension   Myoclonus Continue Keppra   Dysautonomia orthostatic hypotension with recurrent syncope Most recent syncopal episode 11/18 Continue midodrine Continue fall precautions Up with assistance only Consider PT OT consult from tomorrow when clinically improved   Anemia of chronic kidney failure, stage 3 (moderate) (HCC) Hemoglobin at baseline at 8.2   Essential (primary) hypertension Holding hydralazine as patient also on midodrine for orthostatic hypotension   Multiple myeloma (HCC) No acute disease suspected       DVT prophylaxis: Lovenox   Consults: Cardiology   Advance Care Planning:   Code Status: Prior    Family Communication: none   Disposition Plan: Back to previous home environment  Subjective:  Patient seen and examined at bedside this morning Appears lethargic Lab review shows patient is currently out of DKA Denies nausea vomiting abdominal pain or chest pain  Physical Exam:  Vitals and nursing note reviewed.  Constitutional:      General: He is not in acute distress.    Appearance: He is underweight.  HENT:     Head:  Normocephalic and atraumatic.   Cardiovascular:     Rate and Rhythm: Normal rate and regular rhythm.     Heart sounds: Normal heart sounds.  Pulmonary:     Effort: Pulmonary effort is normal.     Breath sounds: Normal breath sounds.  Abdominal:     Palpations: Abdomen is soft.     Tenderness: There is no abdominal tenderness.  Neurological: Lethargic with some confusion   Vitals:   03/10/23 0400 03/10/23 0418 03/10/23 0700 03/10/23 1000  BP:   (!) 156/86   Pulse:   88   Resp: 17 15  12   Temp:   98.4 F (36.9 C)   TempSrc:   Oral   SpO2:   100%   Weight: 62.6 kg     Height:        Data Reviewed: I have reviewed patient's CT scan of the brain that showed no acute intracranial pathology I reviewed patient's chest x-ray that did not show any acute intrapulmonary pathology    Latest Ref Rng & Units 03/10/2023    5:09 AM 03/09/2023    5:22 PM 03/09/2023    7:14 AM  BMP  Glucose 70 - 99 mg/dL 96  098  119   BUN 8 - 23 mg/dL 59  67  66   Creatinine 0.61 - 1.24 mg/dL 1.47  8.29  5.62   Sodium 135 - 145 mmol/L 145  139  141   Potassium 3.5 - 5.1 mmol/L 4.0  5.4  4.2   Chloride 98 - 111 mmol/L 117  108  109   CO2 22 - 32 mmol/L 23  18  24    Calcium 8.9 - 10.3 mg/dL 8.3  8.1  8.3        Latest Ref Rng & Units 03/10/2023    5:09 AM 03/08/2023    9:26 PM 02/24/2023    4:47 AM  CBC  WBC 4.0 - 10.5 K/uL 4.7  8.2  2.8   Hemoglobin 13.0 - 17.0 g/dL 7.0  8.2  8.3   Hematocrit 39.0 - 52.0 % 21.5  26.5  25.6   Platelets 150 - 400 K/uL 225  186  232     Family Communication: No family at bedside  Disposition: Status is: Inpatient   Time spent: 57 minutes  Author: Pennie Banter, DO 03/10/2023 3:16 PM  For on call review [email or web address].

## 2023-03-10 NOTE — Progress Notes (Signed)
*  PRELIMINARY RESULTS* Echocardiogram 2D Echocardiogram has been performed.  Nicholas Weiss 03/10/2023, 11:51 AM

## 2023-03-10 NOTE — Evaluation (Signed)
Occupational Therapy Evaluation Patient Details Name: Nicholas Weiss MRN: [id number] DOB: December 04, 1950 Today's Date: 03/10/2023   History of Present Illness Nicholas Weiss is a 72 y.o. male with medical history significant for hypertension, prior CVA, multiple myeloma, CKD 3B, paroxysmal A-fib, type 1 diabetes mellitus, A-fib on Eliquis, myoclonus on Keppra, dysautonomia/orthostatic hypotension, with recurrent syncope, hospitalized from 11/18 to 02/24/2023 with intraventricular hemorrhage related to a syncopal event who presents by EMS with altered mental status noted when his partner visited him at his facility.  EMS reported that patient had been altered for the past 3 days and his blood glucose readings were high.   Clinical Impression   Pt was seen for OT evaluation this date. Prior to hospital admission, pt was getting STR at a facility. Unable to provide information on what he had been working on here. Prior to that admission, he was living at home with his girlfriend and IND.  Pt presents to acute OT demonstrating impaired ADL performance and functional mobility 2/2 weakness, cognitive deficits/confusion, balance deficits  (See OT problem list for additional functional deficits). Pt currently requires CGA to SUP for all bed mobility. Unable to don socks after increased time and cueing provided. Max A to don bil socks at EOB and Min A to regain seated balance at times d/t R lateral lean. Max cueing throughout session and requiring simple one step commands and still unable to consistently follow directions. Mod A x2 needed for STS from EOB x3 and to take lateral steps towards HOB. BP monitored throughout and unsure of accuracy d/t pt moving a lot. Did appear to drop with changes in positions, however pt asymptomatic.  Mentions pain to his tooth/gums. Pt would benefit from skilled OT services to address noted impairments and functional limitations (see below for any additional details) in order to  maximize safety and independence while minimizing falls risk and caregiver burden. Do anticipate the need for follow up OT services upon acute hospital DC.        If plan is discharge home, recommend the following: Direct supervision/assist for medications management;Direct supervision/assist for financial management;Assist for transportation;Help with stairs or ramp for entrance;Two people to help with walking and/or transfers;A lot of help with bathing/dressing/bathroom;Assistance with cooking/housework;Supervision due to cognitive status    Functional Status Assessment  Patient has had a recent decline in their functional status and demonstrates the ability to make significant improvements in function in a reasonable and predictable amount of time.  Equipment Recommendations  Other (comment) (defer)    Recommendations for Other Services       Precautions / Restrictions Precautions Precautions: Fall Precaution Comments: watch orthostatics Required Braces or Orthoses: Other Brace Other Brace: Abdominal binder, B TED hose Restrictions Weight Bearing Restrictions: No      Mobility Bed Mobility Overal bed mobility: Needs Assistance Bed Mobility: Sit to Supine, Supine to Sit     Supine to sit: Used rails, Supervision, Contact guard Sit to supine: Contact guard assist, Used rails   General bed mobility comments: CGA to SUP for all bed mobility today with HOB elevated    Transfers Overall transfer level: Needs assistance Equipment used: Rolling walker (2 wheels) Transfers: Sit to/from Stand Sit to Stand: Mod assist, +2 safety/equipment           General transfer comment: Mod A x2 for multiple STS from EOB for safety and max cueing for initiation; max cueing to take lateral steps to [facility]      Balance Overall balance  assessment: Needs assistance Sitting-balance support: Feet supported, Bilateral upper extremity supported Sitting balance-Leahy Scale: Poor Sitting balance  - Comments: R lateral lean while seated EOB with assist from therapist to return to upright posture at times   Standing balance support: Bilateral upper extremity supported, During functional activity, Reliant on assistive device for balance Standing balance-Leahy Scale: Fair Standing balance comment: reliant on AD and x2 assist to maintain balance during lateral steps                           ADL either performed or assessed with clinical judgement   ADL Overall ADL's : Needs assistance/impaired                     Lower Body Dressing: Sitting/lateral leans;Maximal assistance Lower Body Dressing Details (indicate cue type and reason): pt attempting to don socks, but not able to follow cues well today ultimately required Max A to don bil socks, however then later in session able to try to take sock off on his own seated EOB                     Vision         Perception         Praxis         Pertinent Vitals/Pain Pain Assessment Pain Assessment: Faces Faces Pain Scale: Hurts little more Pain Location: tooth/gums Pain Descriptors / Indicators: Discomfort, Sore Pain Intervention(s): Monitored during session     Extremity/Trunk Assessment Upper Extremity Assessment Upper Extremity Assessment: Generalized weakness   Lower Extremity Assessment Lower Extremity Assessment: Generalized weakness   Cervical / Trunk Assessment Cervical / Trunk Assessment: Normal   Communication Communication Communication: Difficulty following commands/understanding;Difficulty communicating thoughts/reduced clarity of speech Following commands: Follows one step commands with increased time;Follows one step commands inconsistently Cueing Techniques: Verbal cues;Tactile cues   Cognition Arousal: Lethargic Behavior During Therapy: Flat affect Overall Cognitive Status: Impaired/Different from baseline Area of Impairment: Following commands, Safety/judgement,  Awareness, Problem solving, Attention, Memory                 Orientation Level: Disoriented to, Time, Situation, Place Current Attention Level: Focused Memory: Decreased short-term memory Following Commands: Follows one step commands inconsistently, Follows one step commands with increased time Safety/Judgement: Decreased awareness of safety, Decreased awareness of deficits Awareness: Intellectual Problem Solving: Slow processing, Decreased initiation, Requires verbal cues, Requires tactile cues General Comments: patient requires multi modal cues for mobility/safety     General Comments  limited by cognition; unsure of accuracy of BP readings-seemingly high    Exercises Other Exercises Other Exercises: Edu in role of OT in acute settign and importance of therapy.   Shoulder Instructions      Home Living Family/patient expects to be discharged to:: Skilled nursing facility                                 Additional Comments: patient unable to provide historical information. Per chart he is from SNF; prior to SNF placement 2 weeks ago he was IND living at home with his girlfriend      Prior Functioning/Environment               Mobility Comments: unsure ADLs Comments: unsure        OT Problem List: Decreased coordination;Decreased activity tolerance;Decreased safety awareness;Impaired balance (sitting and/or standing);Decreased  knowledge of precautions;Decreased knowledge of use of DME or AE;Decreased strength;Decreased cognition      OT Treatment/Interventions: Self-care/ADL training;Therapeutic exercise;Patient/family education;Balance training;Therapeutic activities;Cognitive remediation/compensation    OT Goals(Current goals can be found in the care plan section) Acute Rehab OT Goals Patient Stated Goal: improve strength OT Goal Formulation: With patient Time For Goal Achievement: 03/24/23 Potential to Achieve Goals: Good ADL Goals Pt Will  Perform Grooming: with supervision;sitting Pt Will Perform Lower Body Bathing: with min assist;sitting/lateral leans;sit to/from stand Pt Will Perform Lower Body Dressing: with min assist;sit to/from stand;sitting/lateral leans Pt Will Transfer to Toilet: with contact guard assist;bedside commode;stand pivot transfer Pt Will Perform Toileting - Clothing Manipulation and hygiene: sitting/lateral leans;sit to/from stand;with min assist  OT Frequency: Min 1X/week    Co-evaluation PT/OT/SLP Co-Evaluation/Treatment: Yes Reason for Co-Treatment: For patient/therapist safety PT goals addressed during session: Mobility/safety with mobility;Balance;Proper use of DME OT goals addressed during session: ADL's and self-care;Proper use of Adaptive equipment and DME      AM-PAC OT "6 Clicks" Daily Activity     Outcome Measure Help from another person eating meals?: A Little Help from another person taking care of personal grooming?: A Little Help from another person toileting, which includes using toliet, bedpan, or urinal?: A Lot Help from another person bathing (including washing, rinsing, drying)?: A Lot Help from another person to put on and taking off regular upper body clothing?: A Little Help from another person to put on and taking off regular lower body clothing?: A Lot 6 Click Score: 15   End of Session Equipment Utilized During Treatment: Rolling walker (2 wheels) Nurse Communication: Mobility status  Activity Tolerance: Patient tolerated treatment well Patient left: in bed;with call bell/phone within reach;with bed alarm set;with nursing/sitter in room  OT Visit Diagnosis: Other abnormalities of gait and mobility (R26.89);Repeated falls (R29.6);Unsteadiness on feet (R26.81)                Time: 6644-0347 OT Time Calculation (min): 34 min Charges:  OT General Charges $OT Visit: 1 Visit OT Evaluation $OT Eval Moderate Complexity: 1 Mod OT Treatments $Self Care/Home Management : 8-22  mins Sanaia Jasso, OTR/L 03/10/23, 4:17 PM  Jadrien Narine E Jong Rickman 03/10/2023, 4:14 PM

## 2023-03-10 NOTE — Inpatient Diabetes Management (Signed)
Inpatient Diabetes Program Recommendations  AACE/ADA: New Consensus Statement on Inpatient Glycemic Control (2015)  Target Ranges:  Prepandial:   less than 140 mg/dL      Peak postprandial:   less than 180 mg/dL (1-2 hours)      Critically ill patients:  140 - 180 mg/dL   Lab Results  Component Value Date   GLUCAP 25 (LL) 03/10/2023   HGBA1C 9.0 (H) 11/14/2022    Review of Glycemic Control  Latest Reference Range & Units 03/09/23 21:21 03/10/23 03:43 03/10/23 03:45 03/10/23 04:03 03/10/23 08:42 03/10/23 08:44  Glucose-Capillary 70 - 99 mg/dL 962 (H) 25 (LL) 30 (LL) 145 (H) <10 (LL) 25 (LL)  (LL): Data is critically low (H): Data is abnormally high  Diabetes history: DM1 Outpatient Diabetes medications:  Tresiba 5 units every day Humalog 0-9 units TID Current orders for Inpatient glycemic control:  Semglee 15 units BID Novolog 0-9 units TID and 0-5 units QHS  Inpatient Diabetes Program Recommendations:    Please decrease Semglee further to 8 units at bedtime  Will continue to follow while inpatient.  Thank you, Dulce Sellar, MSN, CDCES Diabetes Coordinator Inpatient Diabetes Program [phone] (team pager from 8a-5p)

## 2023-03-10 NOTE — Progress Notes (Signed)
[facility] CLINIC CARDIOLOGY PROGRESS NOTE       Patient ID: Nicholas Weiss MRN: [id number] DOB/AGE: 70/08/1950 70 y.o.  Admit date: 03/08/2023 Referring Physician Dr. Lindajo Royal Primary Physician Marisue Ivan, MD  Primary Cardiologist None - seen by [facility] during prior hospitalizations Reason for Consultation elevated troponins  HPI: Nicholas Weiss is a 72 y.o. male  with a past medical history of uncontrolled type 1 diabetes, multiple myeloma, CKD 3, history of CVA, HTN, HLD, cocaine abuse  who presented to the ED on 03/08/2023 for AMS. Found to be in DKA, troponins checked and elevated. Cardiology was consulted for further evaluation.   Interval history: -Patient resting comfortably this AM. States he feels ok.  -Denies any chest pain, SOB, palpitations.  -Cr downtrending. BP and HR stable.  Review of systems complete and found to be negative unless listed above    Past Medical History:  Diagnosis Date   Acute metabolic encephalopathy 12/02/2020   DKA (diabetic ketoacidoses) 04/06/2016   Hypercholesteremia    Hypertension     Past Surgical History:  Procedure Laterality Date   AMPUTATION TOE Left 04/25/2022   Procedure: LEFT GREAT TOE AMPUTATION AND LEFT PARTIAL 2ND TOE AMPUTATION;  Surgeon: Felecia Shelling, DPM;  Location: [facility] ORS;  Service: Podiatry;  Laterality: Left;   COLONOSCOPY WITH PROPOFOL N/A 02/01/2020   Procedure: COLONOSCOPY WITH PROPOFOL;  Surgeon: Toney Reil, MD;  Location: [facility] ENDOSCOPY;  Service: Gastroenterology;  Laterality: N/A;   ESOPHAGOGASTRODUODENOSCOPY  02/01/2020   Procedure: ESOPHAGOGASTRODUODENOSCOPY (EGD);  Surgeon: Toney Reil, MD;  Location: [facility] ENDOSCOPY;  Service: Gastroenterology;;   ESOPHAGOGASTRODUODENOSCOPY (EGD) WITH PROPOFOL N/A 06/07/2022   Procedure: ESOPHAGOGASTRODUODENOSCOPY (EGD) WITH PROPOFOL;  Surgeon: Kerin Salen, MD;  Location: [facility] ENDOSCOPY;  Service: Gastroenterology;  Laterality: N/A;   KNEE SURGERY Right     Torn meniscus   KNEE SURGERY Left     Medications Prior to Admission  Medication Sig Dispense Refill Last Dose   acetaminophen (TYLENOL) 500 MG tablet Take 1,000 mg by mouth daily as needed for mild pain or moderate pain.      apixaban (ELIQUIS) 5 MG TABS tablet Take 1 tablet (5 mg total) by mouth 2 (two) times daily. Start Eliquis after 2 weeks which will be 03/02/2023      ascorbic acid (VITAMIN C) 500 MG tablet Take 1 tablet (500 mg total) by mouth daily. 90 tablet 0    atorvastatin (LIPITOR) 80 MG tablet Take 1 tablet (80 mg total) by mouth at bedtime. Hold while taking Paxlovid 30 tablet 2    bisacodyl 5 MG EC tablet Take 1 tablet (5 mg total) by mouth at bedtime. Skip the dose if no constipation      fludrocortisone (FLORINEF) 0.1 MG tablet Take 1 tablet (0.1 mg total) by mouth daily. Skip the dose if SBP >130      hydrALAZINE (APRESOLINE) 25 MG tablet Take 1 tablet (25 mg total) by mouth 3 (three) times daily as needed (SBP >160).      insulin degludec (TRESIBA FLEXTOUCH) 100 UNIT/ML FlexTouch Pen Inject 5 Units into the skin daily. Home med.      levETIRAcetam (KEPPRA) 500 MG tablet Take 1 tablet (500 mg total) by mouth 2 (two) times daily. (Patient not taking: Reported on 02/16/2023) 60 tablet 2    midodrine (PROAMATINE) 10 MG tablet Take 1 tablet (10 mg total) by mouth 3 (three) times daily with meals. Hold if SBP >140      Multiple Vitamin (MULTIVITAMIN  WITH MINERALS) TABS tablet Take 1 tablet by mouth daily. 90 tablet 1    NOVOLOG 100 UNIT/ML injection Sliding Scale: Glucose 70 - 120: 0 units  Glucose 121 - 150: 1 unit  Glucose 151 - 200: 2 units Glucose 201 - 250: 3 units  Glucose 251 - 300: 5 units  Glucose 301 - 350: 7 units  Glucose 351 - 400: 9 units  Glucose > 400: call your docotr (Patient not taking: Reported on 02/16/2023) 10 mL 1    ondansetron (ZOFRAN) 8 MG tablet Take 8 mg by mouth daily as needed for nausea or vomiting.      polyethylene glycol (MIRALAX) 17 g  packet Take 17 g by mouth 2 (two) times daily. Skip the dose if no constipation      QUEtiapine (SEROQUEL) 25 MG tablet Take 1 tablet (25 mg total) by mouth every evening.      traMADol (ULTRAM) 50 MG tablet Take 1 tablet (50 mg total) by mouth every 6 (six) hours as needed. 10 tablet 0    traZODone (DESYREL) 50 MG tablet Take 0.5 tablets (25 mg total) by mouth at bedtime as needed for sleep.      Vitamin D, Ergocalciferol, (DRISDOL) 1.25 MG (50000 UNIT) CAPS capsule Take 1 capsule (50,000 Units total) by mouth every 7 (seven) days.      Social History   Socioeconomic History   Marital status: Single    Spouse name: Not on file   Number of children: 3   Years of education: Not on file   Highest education level: High school graduate  Occupational History    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  Tobacco Use   Smoking status: Never   Smokeless tobacco: Never  Vaping Use   Vaping status: Never Used  Substance and Sexual Activity   Alcohol use: Not Currently    Alcohol/week: 0.0 - 1.0 standard drinks of alcohol    Comment: "once every 2 months"   Drug use: Yes    Types: Marijuana, "Crack" cocaine    Comment: last week   Sexual activity: Not Currently    Birth control/protection: None  Other Topics Concern   Not on file  Social History Narrative   Lives with girlfriend "sharon"   Social Determinants of Health   Financial Resource Strain: Low Risk  (01/26/2023)   Received from [facility]   Overall Financial Resource Strain (CARDIA)    Difficulty of Paying Living Expenses: Not very hard  Food Insecurity: Patient Unable To Answer (03/09/2023)   Hunger Vital Sign    Worried About Running Out of Food in the Last Year: Patient unable to answer    Ran Out of Food in the Last Year: Patient unable to answer  Transportation Needs: No Transportation Needs (02/16/2023)   PRAPARE - Administrator, Civil Service (Medical): No    Lack of Transportation (Non-Medical): No   Physical Activity: Insufficiently Active (01/01/2020)   Exercise Vital Sign    Days of Exercise per Week: 7 days    Minutes of Exercise per Session: 20 min  Stress: No Stress Concern Present (01/01/2020)   [employer] of Occupational Health - Occupational Stress Questionnaire    Feeling of Stress : Not at all  Social Connections: Moderately Isolated (01/01/2020)   Social Connection and Isolation Panel [NHANES]    Frequency of Communication with Friends and Family: More than three times a week    Frequency of Social Gatherings with Friends and Family:  More than three times a week    Attends Religious Services: Never    Active Member of Clubs or Organizations: No    Attends Banker Meetings: Never    Marital Status: Living with partner  Intimate Partner Violence: Not At Risk (02/16/2023)   Humiliation, Afraid, Rape, and Kick questionnaire    Fear of Current or Ex-Partner: No    Emotionally Abused: No    Physically Abused: No    Sexually Abused: No    Family History  Problem Relation Age of Onset   Heart attack Father    Hypertension Sister    Cancer Sister      Vitals:   03/10/23 0300 03/10/23 0313 03/10/23 0400 03/10/23 0418  BP:  138/72    Pulse:  86    Resp: 14 20 17 15   Temp:  98.3 F (36.8 C)    TempSrc:  Oral    SpO2:  99%    Weight:   62.6 kg   Height:        PHYSICAL EXAM General: Chronically ill-appearing, well nourished, in no acute distress resting comfortably in hospital bed. HEENT: Normocephalic and atraumatic. Neck: No JVD.  Lungs: Normal respiratory effort on room air. Clear bilaterally to auscultation. No wheezes, crackles, rhonchi.  Heart: HRRR. Normal S1 and S2 without gallops or murmurs.  Abdomen: Non-distended appearing.  Msk: Normal strength and tone for age. Extremities: Warm and well perfused. No clubbing, cyanosis. No edema.  Neuro: Alert and oriented X 3. Psych: Answers questions appropriately.   Labs: Basic Metabolic  Panel: Recent Labs    03/09/23 1722 03/10/23 0509  NA 139 145  K 5.4* 4.0  CL 108 117*  CO2 18* 23  GLUCOSE 499* 96  BUN 67* 59*  CREATININE 3.15* 2.42*  CALCIUM 8.1* 8.3*   Liver Function Tests: Recent Labs    03/08/23 2257  AST 31  ALT 26  ALKPHOS 89  BILITOT 1.2*  PROT 9.0*  ALBUMIN 3.8   No results for input(s): "LIPASE", "AMYLASE" in the last 72 hours. CBC: Recent Labs    03/08/23 2126 03/10/23 0509  WBC 8.2 4.7  NEUTROABS  --  3.9  HGB 8.2* 7.0*  HCT 26.5* 21.5*  MCV 99.6 93.1  PLT 186 225   Cardiac Enzymes: Recent Labs    03/08/23 2126 03/08/23 2257  TROPONINIHS 438* 506*   BNP: No results for input(s): "BNP" in the last 72 hours. D-Dimer: No results for input(s): "DDIMER" in the last 72 hours. Hemoglobin A1C: No results for input(s): "HGBA1C" in the last 72 hours. Fasting Lipid Panel: No results for input(s): "CHOL", "HDL", "LDLCALC", "TRIG", "CHOLHDL", "LDLDIRECT" in the last 72 hours. Thyroid Function Tests: No results for input(s): "TSH", "T4TOTAL", "T3FREE", "THYROIDAB" in the last 72 hours.  Invalid input(s): "FREET3" Anemia Panel: No results for input(s): "VITAMINB12", "FOLATE", "FERRITIN", "TIBC", "IRON", "RETICCTPCT" in the last 72 hours.   Radiology: [facility] Chest Port 1 View  Result Date: 03/09/2023 CLINICAL DATA:  DKA EXAM: PORTABLE CHEST 1 VIEW COMPARISON:  02/12/2023 FINDINGS: Heart and mediastinal contours are within normal limits. No focal opacities or effusions. No acute bony abnormality. Aortic atherosclerosis. IMPRESSION: No active disease. Electronically Signed   By: Charlett Nose M.D.   On: 03/09/2023 01:10   CT Head Wo Contrast  Result Date: 03/08/2023 CLINICAL DATA:  Follow-up recent intracranial hemorrhage. EXAM: CT HEAD WITHOUT CONTRAST TECHNIQUE: Contiguous axial images were obtained from the base of the skull through the vertex without intravenous contrast.  RADIATION DOSE REDUCTION: This exam was performed according to the  departmental dose-optimization program which includes automated exposure control, adjustment of the mA and/or kV according to patient size and/or use of iterative reconstruction technique. COMPARISON:  Head CT dated 02/20/2023. FINDINGS: Evaluation of this exam is limited due to motion artifact. Brain: Mild age-related atrophy and moderate chronic microvascular ischemic changes. Interval resolution of the previously seen intraventricular hemorrhage. No acute intracranial hemorrhage. No mass effect or midline shift. No extra-axial fluid collection. Vascular: No hyperdense vessel or unexpected calcification. Skull: Normal. Negative for fracture or focal lesion. Sinuses/Orbits: No acute finding. Other: None IMPRESSION: 1. No acute intracranial pathology. 2. Interval resolution of the previously seen intraventricular hemorrhage. 3. Mild age-related atrophy and moderate chronic microvascular ischemic changes. Electronically Signed   By: Elgie Collard M.D.   On: 03/08/2023 22:24   US RENAL  Result Date: 02/20/2023 CLINICAL DATA:  [id number] CKD (chronic kidney disease) stage 3, GFR 30-59 ml/min (HCC) [id number] EXAM: RENAL / URINARY TRACT ULTRASOUND COMPLETE COMPARISON:  December 01, 2020 FINDINGS: Evaluation is limited by limited acoustic windows and shadowing bowel gas Right Kidney: Renal measurements: 9.3 x 3.7 x 4.5 cm = volume: 82 mL. Echogenicity is slightly increased, similar comparison to prior. No mass or hydronephrosis visualized. Left Kidney: Renal measurements: 9.2 x 5.4 x 5.8 cm = volume: 150 mL. Echogenicity within normal limits. No mass or hydronephrosis visualized. Bladder: Appears normal for degree of bladder distention. Prevoid volume 337 ML. Postvoid volume 102 ML. Other: None. IMPRESSION: 1. No hydronephrosis. 2. Postvoid residual of 102 mL. Electronically Signed   By: Meda Klinefelter M.D.   On: 02/20/2023 11:56   CT HEAD WO CONTRAST ( )  Result Date: 02/20/2023 CLINICAL DATA:  72 year old  male status post fall. Recent right frontal horn intraventricular hemorrhage after a fall. EXAM: CT HEAD WITHOUT CONTRAST TECHNIQUE: Contiguous axial images were obtained from the base of the skull through the vertex without intravenous contrast. RADIATION DOSE REDUCTION: This exam was performed according to the departmental dose-optimization program which includes automated exposure control, adjustment of the mA and/or kV according to patient size and/or use of iterative reconstruction technique. COMPARISON:  Head CT 02/16/2023 and earlier. FINDINGS: Brain: Ongoing right lateral intraventricular hemorrhage, with mild extension since 02/16/2023. Hyperdense blood now surrounds most of the right choroid plexus. Some of this has read distributed from the right frontal horn. But the hemorrhage volume has increased. Minimal extension into the 3rd ventricle (series 2, image 15). But stable ventricle size and configuration. No ventriculomegaly. Left lateral and 4th ventricles remain spared. No transependymal edema. No superimposed midline shift, mass effect, evidence of mass lesion,or evidence of cortically based acute infarction. Patchy bilateral white matter hypodensity is stable. Vascular: No suspicious intracranial vascular hyperdensity. Calcified atherosclerosis at the skull base. Skull: Stable and intact. Sinuses/Orbits: Visualized paranasal sinuses and mastoids are stable and well aerated. Other: Stable orbit and scalp soft tissues. No acute injury identified. IMPRESSION: 1. Increased volume of right lateral intraventricular hemorrhage since 02/16/2023, now moderate. But still the blood is fairly isolated to that ventricle; minimal extension into the 3rd ventricle. No associated ventriculomegaly or transependymal edema. 2. Underlying chronic small vessel disease. No other acute intracranial abnormality. Electronically Signed   By: Odessa Fleming M.D.   On: 02/20/2023 05:04   CT HEAD WO CONTRAST ( )  Result Date:  02/16/2023 CLINICAL DATA:  Follow-up hemorrhage EXAM: CT HEAD WITHOUT CONTRAST TECHNIQUE: Contiguous axial images were obtained from the base of the skull through the  vertex without intravenous contrast. RADIATION DOSE REDUCTION: This exam was performed according to the departmental dose-optimization program which includes automated exposure control, adjustment of the mA and/or kV according to patient size and/or use of iterative reconstruction technique. COMPARISON:  02/15/2023 FINDINGS: Brain: Again seen is the blood within the anterior horn of the right lateral ventricle, unchanged. No new areas of hemorrhage. No hydrocephalus. No acute infarct. No mass effect or midline shift. Vascular: No hyperdense vessel or unexpected calcification. Skull: No acute calvarial abnormality. Sinuses/Orbits: No acute findings Other: None IMPRESSION: Stable hemorrhage in the anterior horn of the right lateral ventricle. Electronically Signed   By: Charlett Nose M.D.   On: 02/16/2023 03:49   CT ANGIO HEAD NECK W WO CM  Result Date: 02/16/2023 CLINICAL DATA:  Hemorrhage in the right frontal horn on CT head EXAM: CT ANGIOGRAPHY HEAD AND NECK WITH AND WITHOUT CONTRAST TECHNIQUE: Multidetector CT imaging of the head and neck was performed using the standard protocol during bolus administration of intravenous contrast. Multiplanar CT image reconstructions and MIPs were obtained to evaluate the vascular anatomy. Carotid stenosis measurements (when applicable) are obtained utilizing NASCET criteria, using the distal internal carotid diameter as the denominator. RADIATION DOSE REDUCTION: This exam was performed according to the departmental dose-optimization program which includes automated exposure control, adjustment of the mA and/or kV according to patient size and/or use of iterative reconstruction technique. CONTRAST:  75mL OMNIPAQUE IOHEXOL 350 MG/ML SOLN COMPARISON:  No prior CTA available, correlation is made with 08/10/2021  MRA head and neck and 02/05/2023 CT head FINDINGS: CT HEAD FINDINGS For noncontrast findings, please see same day CT head. No evidence of active extravasation in right frontal horn hemorrhage. Multiple dural based enhancing lesions, the largest of which measures up to 9 mm (series 7, image 69 and 107), with smaller lesions along the left cerebral convexity (series 7, images 86, 96, and 98) and falx (series 7, image 74, for example), which correlate with calcified lesions on the prior head CT, most likely small meningiomas, without significant mass effect. CTA NECK FINDINGS Aortic arch: Standard branching. Imaged portion shows no evidence of aneurysm or dissection. No significant stenosis of the major arch vessel origins. Mild aortic atherosclerosis. Right carotid system: 50% stenosis in the proximal right ICA, just distal to the bifurcation (series 7, image 221). No evidence of dissection. Left carotid system: 50% stenosis in the proximal left ICA, just distal to the bifurcation (series 7, image 210). No evidence of dissection. Vertebral arteries: Severe stenosis at the origin of right vertebral artery (series 9, image 62). The right vertebral artery is otherwise patent to the skull base. The left vertebral artery is patent from its origin to the skull base without significant stenosis. No evidence of dissection. Skeleton: No acute osseous abnormality. Degenerative changes in the cervical spine. Edentulous with calcifications but Other neck: No acute finding. Upper chest: No focal pulmonary opacity or pleural effusion. Review of the MIP images confirms the above findings CTA HEAD FINDINGS Anterior circulation: Both internal carotid arteries are patent to the termini, without significant stenosis. A1 segments patent, hypoplastic on the right. Normal anterior communicating artery. Anterior cerebral arteries are patent to their distal aspects without significant stenosis. No M1 stenosis or occlusion. MCA branches  perfused to their distal aspects without significant stenosis. Posterior circulation: The bilateral V4 segments primarily supply the PICAs, with diminutive distal V4 segments patent to vertebrobasilar junction. Basilar patent to its distal aspect without significant stenosis. Superior cerebellar arteries patent proximally. Patent,  hypoplastic P1 segments. Near fetal origin of the bilateral PCAs from the posterior communicating arteries. PCAs perfused to their distal aspects without significant stenosis. Venous sinuses: As permitted by contrast timing, patent. Anatomic variants: Near fetal origin of the bilateral PCAs. No evidence of aneurysm or vascular malformation. Review of the MIP images confirms the above findings IMPRESSION: 1. No evidence of active extravasation in the right frontal horn hemorrhage. 2. No intracranial large vessel occlusion or significant stenosis. 3. 50% stenosis in the proximal bilateral ICAs, just distal to the bifurcations. 4. Severe stenosis at the origin of the right vertebral artery. 5. Multiple dural based enhancing lesions, the largest of which measures up to 9 mm, which correlate with calcified lesions on the prior head CT, most likely small meningiomas, without significant mass effect. 6. Aortic atherosclerosis. Aortic Atherosclerosis (ICD10-I70.0). Electronically Signed   By: Wiliam Ke M.D.   On: 02/16/2023 00:35   CT HEAD WO CONTRAST ( )  Result Date: 02/15/2023 CLINICAL DATA:  Recent fall, with subsequent syncopal event, headache and neck pain EXAM: CT HEAD WITHOUT CONTRAST CT CERVICAL SPINE WITHOUT CONTRAST TECHNIQUE: Multidetector CT imaging of the head and cervical spine was performed following the standard protocol without intravenous contrast. Multiplanar CT image reconstructions of the cervical spine were also generated. RADIATION DOSE REDUCTION: This exam was performed according to the departmental dose-optimization program which includes automated exposure  control, adjustment of the mA and/or kV according to patient size and/or use of iterative reconstruction technique. COMPARISON:  02/12/2023 CT head and cervical spine FINDINGS: CT HEAD FINDINGS Brain: Small amount of hyperdense hemorrhage in the right frontal horn (series 2, image 11), which is new from the 02/12/2023 exam, of indeterminate etiology. No hydrocephalus. No evidence of parenchymal hemorrhage, subdural hemorrhage, or subarachnoid hemorrhage. No acute infarct, mass, mass effect, or midline shift. Vascular: No hyperdense vessel. Skull: Negative for fracture or focal lesion. Sinuses/Orbits: No acute finding. Other: The mastoid air cells are well aerated. CT CERVICAL SPINE FINDINGS Alignment: No traumatic listhesis. Straightening and mild reversal of the normal cervical lordosis. Trace anterolisthesis of C3 on C4 and trace retrolisthesis of C5 on C6 and C6 on C7. Skull base and vertebrae: No acute fracture or suspicious osseous lesion. Soft tissues and spinal canal: No prevertebral fluid or swelling. No visible canal hematoma. Disc levels: Degenerative changes in the cervical spine.No high-grade spinal canal stenosis. Upper chest: For findings in the thorax, please see same day CT chest. IMPRESSION: 1. Small amount of hyperdense hemorrhage in the right frontal horn, which is new from the 02/12/2023 exam, of indeterminate etiology. No hydrocephalus. 2. No acute fracture or traumatic listhesis in the cervical spine. These results were called by telephone at the time of interpretation on 02/15/2023 at 10:07 pm to provider [facility] , who verbally acknowledged these results. Electronically Signed   By: Wiliam Ke M.D.   On: 02/15/2023 22:07   CT Cervical Spine Wo Contrast  Result Date: 02/15/2023 CLINICAL DATA:  Recent fall, with subsequent syncopal event, headache and neck pain EXAM: CT HEAD WITHOUT CONTRAST CT CERVICAL SPINE WITHOUT CONTRAST TECHNIQUE: Multidetector CT imaging of the head and  cervical spine was performed following the standard protocol without intravenous contrast. Multiplanar CT image reconstructions of the cervical spine were also generated. RADIATION DOSE REDUCTION: This exam was performed according to the departmental dose-optimization program which includes automated exposure control, adjustment of the mA and/or kV according to patient size and/or use of iterative reconstruction technique. COMPARISON:  02/12/2023 CT head and  cervical spine FINDINGS: CT HEAD FINDINGS Brain: Small amount of hyperdense hemorrhage in the right frontal horn (series 2, image 11), which is new from the 02/12/2023 exam, of indeterminate etiology. No hydrocephalus. No evidence of parenchymal hemorrhage, subdural hemorrhage, or subarachnoid hemorrhage. No acute infarct, mass, mass effect, or midline shift. Vascular: No hyperdense vessel. Skull: Negative for fracture or focal lesion. Sinuses/Orbits: No acute finding. Other: The mastoid air cells are well aerated. CT CERVICAL SPINE FINDINGS Alignment: No traumatic listhesis. Straightening and mild reversal of the normal cervical lordosis. Trace anterolisthesis of C3 on C4 and trace retrolisthesis of C5 on C6 and C6 on C7. Skull base and vertebrae: No acute fracture or suspicious osseous lesion. Soft tissues and spinal canal: No prevertebral fluid or swelling. No visible canal hematoma. Disc levels: Degenerative changes in the cervical spine.No high-grade spinal canal stenosis. Upper chest: For findings in the thorax, please see same day CT chest. IMPRESSION: 1. Small amount of hyperdense hemorrhage in the right frontal horn, which is new from the 02/12/2023 exam, of indeterminate etiology. No hydrocephalus. 2. No acute fracture or traumatic listhesis in the cervical spine. These results were called by telephone at the time of interpretation on 02/15/2023 at 10:07 pm to provider [facility] , who verbally acknowledged these results. Electronically Signed   By:  Wiliam Ke M.D.   On: 02/15/2023 22:07   CT Chest Wo Contrast  Result Date: 02/15/2023 CLINICAL DATA:  Chest trauma and pain. EXAM: CT CHEST WITHOUT CONTRAST TECHNIQUE: Multidetector CT imaging of the chest was performed following the standard protocol without IV contrast. RADIATION DOSE REDUCTION: This exam was performed according to the departmental dose-optimization program which includes automated exposure control, adjustment of the mA and/or kV according to patient size and/or use of iterative reconstruction technique. COMPARISON:  Chest CT dated 02/17/2022. FINDINGS: Evaluation of this exam is limited in the absence of intravenous contrast. Cardiovascular: There is no cardiomegaly. Small pericardial effusion. Advanced 3 vessel coronary vascular calcification. Mild atherosclerotic calcification of the thoracic aorta. No aneurysmal dilatation. The central pulmonary arteries are grossly unremarkable on this noncontrast CT. Mediastinum/Nodes: No hilar or mediastinal adenopathy. The esophagus is grossly unremarkable. No mediastinal fluid collection. Lungs/Pleura: Bibasilar linear atelectasis/scarring. Small left lower lobe subpleural calcified granuloma. No focal consolidation, pleural effusion or pneumothorax. The central airways are patent. Upper Abdomen: A 3 mm nonobstructing right renal upper pole calculus. Probable small cyst in the dome of the liver. Musculoskeletal: Osteopenia. Old compression fracture of superior endplate of L1. No acute osseous pathology. Acute fractures of the lateral right 8-10th ribs. IMPRESSION: 1. Acute fractures of the lateral right 8-10th ribs. No pneumothorax. 2. Advanced coronary vascular calcification. 3. A 3 mm nonobstructing right renal upper pole calculus. 4.  Aortic Atherosclerosis (ICD10-I70.0). Electronically Signed   By: Elgie Collard M.D.   On: 02/15/2023 22:01   DG Ribs Unilateral W/Chest Right  Result Date: 02/12/2023 CLINICAL DATA:  Fall. EXAM: RIGHT  RIBS AND CHEST - 3+ VIEW COMPARISON:  09/18/2022. FINDINGS: Bilateral lung fields are clear. Bilateral costophrenic angles are clear. Normal cardio-mediastinal silhouette. There is acute minimally displaced fracture of the posterolateral right tenth rib. No other acute displaced rib fracture. The soft tissues are within normal limits. IMPRESSION: *There is a minimally displaced fracture of the posterolateral right tenth rib. Otherwise no acute cardiopulmonary abnormality Electronically Signed   By: Jules Schick M.D.   On: 02/12/2023 16:25   CT Head Wo Contrast  Result Date: 02/12/2023 CLINICAL DATA:  Head trauma,  minor (Age >= 65y); Neck trauma (Age >= 65y) EXAM: CT HEAD WITHOUT CONTRAST CT CERVICAL SPINE WITHOUT CONTRAST TECHNIQUE: Multidetector CT imaging of the head and cervical spine was performed following the standard protocol without intravenous contrast. Multiplanar CT image reconstructions of the cervical spine were also generated. RADIATION DOSE REDUCTION: This exam was performed according to the departmental dose-optimization program which includes automated exposure control, adjustment of the mA and/or kV according to patient size and/or use of iterative reconstruction technique. COMPARISON:  None Available. FINDINGS: CT HEAD FINDINGS Brain: No evidence of acute infarction, hemorrhage, hydrocephalus, extra-axial collection or mass lesion/mass effect. Vascular: No hyperdense vessel. Skull: No acute fracture. Sinuses/Orbits: Clear sinuses.  No acute orbital finding. Other: No mastoid effusions. CT CERVICAL SPINE FINDINGS Alignment: Mild, likely degenerative retrolisthesis at C5-C6 and C6-C7. Otherwise, no substantial sagittal subluxation. Skull base and vertebrae: No evidence of acute fracture. Vertebral body heights are maintained. Soft tissues and spinal canal: No prevertebral fluid or swelling. No visible canal hematoma. Disc levels: Moderate multilevel degenerative change, greatest at C5-C6 and  C6-C7. Upper chest: Visualized lung apices are clear. IMPRESSION: No evidence of acute abnormality intracranially or in the cervical spine. Electronically Signed   By: Feliberto Harts M.D.   On: 02/12/2023 12:56   CT Cervical Spine Wo Contrast  Result Date: 02/12/2023 CLINICAL DATA:  Head trauma, minor (Age >= 65y); Neck trauma (Age >= 65y) EXAM: CT HEAD WITHOUT CONTRAST CT CERVICAL SPINE WITHOUT CONTRAST TECHNIQUE: Multidetector CT imaging of the head and cervical spine was performed following the standard protocol without intravenous contrast. Multiplanar CT image reconstructions of the cervical spine were also generated. RADIATION DOSE REDUCTION: This exam was performed according to the departmental dose-optimization program which includes automated exposure control, adjustment of the mA and/or kV according to patient size and/or use of iterative reconstruction technique. COMPARISON:  None Available. FINDINGS: CT HEAD FINDINGS Brain: No evidence of acute infarction, hemorrhage, hydrocephalus, extra-axial collection or mass lesion/mass effect. Vascular: No hyperdense vessel. Skull: No acute fracture. Sinuses/Orbits: Clear sinuses.  No acute orbital finding. Other: No mastoid effusions. CT CERVICAL SPINE FINDINGS Alignment: Mild, likely degenerative retrolisthesis at C5-C6 and C6-C7. Otherwise, no substantial sagittal subluxation. Skull base and vertebrae: No evidence of acute fracture. Vertebral body heights are maintained. Soft tissues and spinal canal: No prevertebral fluid or swelling. No visible canal hematoma. Disc levels: Moderate multilevel degenerative change, greatest at C5-C6 and C6-C7. Upper chest: Visualized lung apices are clear. IMPRESSION: No evidence of acute abnormality intracranially or in the cervical spine. Electronically Signed   By: Feliberto Harts M.D.   On: 02/12/2023 12:56    ECHO pending  TELEMETRY reviewed by me 03/10/2023: Sinus rhythm rate 80s  EKG reviewed by me: NSR  rate 99 bpm, nonischemic  Data reviewed by me 03/10/2023: last 24h vitals tele labs imaging I/O hospitalist progress note  Principal Problem:   Diabetic ketoacidosis without coma associated with type 1 diabetes mellitus (HCC) Active Problems:   Essential (primary) hypertension   Acute renal failure superimposed on stage 3a chronic kidney disease (HCC)   Anemia of chronic kidney failure, stage 3 (moderate) (HCC)   Acute metabolic encephalopathy   Multiple myeloma (HCC)   Dysautonomia orthostatic hypotension with recurrent syncope   History of intraventricular hemorrhage following syncopal event 02/15/2023   Myoclonus   Elevated troponin   Atrial fibrillation (HCC)   Chronic anticoagulation   NSTEMI (non-ST elevated myocardial infarction) (HCC)    ASSESSMENT AND PLAN:  Nicholas Weiss is a  72 y.o. male  with a past medical history of uncontrolled type 1 diabetes, multiple myeloma, CKD 3, history of CVA, HTN, HLD, cocaine abuse  who presented to the ED on 03/08/2023 for AMS. Found to be in DKA, troponins checked and elevated. Cardiology was consulted for further evaluation.   # Diabetic ketoacidosis # Type 1 diabetes, poorly controlled # Acute metabolic encephalopathy # Demand ischemia  Patient presented with altered mental status found to have glucose in the 400s, this reportedly had been elevated at his care facility prior to admission.  Troponin checked and trended for 438 > 506.  He is without chest pain or other anginal symptoms. -DKA management as per primary. -Troponin elevation most consistent with demand/supply mismatch and not ACS in the setting of AKI, diabetic ketoacidosis and given no chest pain or anginal symptoms. -Echo ordered.  No plan for further cardiac diagnostics at this time.  # AKI on CKD stage III Patient with known CKD with baseline creatinine around 1.5 presenting with creatinine of 4.24. Down to 2.42 on AM labs today. -Agree with IV hydration, further  management per primary.  # Paroxysmal atrial fibrillation Patient with history of PAF on Eliquis but this has recently been held given intraventricular hemorrhage. -Resume home eliquis 5 mg twice daily. -Not on any rate control medications due to baseline hypotension.   This patient's plan of care was discussed and created with Dr. Juliann Pares and he is in agreement.  Signed: Gale Journey, PA-C  03/10/2023, 8:19 AM [facility]

## 2023-03-10 NOTE — Evaluation (Signed)
Physical Therapy Evaluation Patient Details Name: Nicholas Weiss MRN: [id number] DOB: May 30, 1950 Today's Date: 03/10/2023  History of Present Illness  Nicholas Weiss is a 72 y.o. male with medical history significant for hypertension, prior CVA, multiple myeloma, CKD 3B, paroxysmal A-fib, type 1 diabetes mellitus, A-fib on Eliquis, myoclonus on Keppra, dysautonomia/orthostatic hypotension, with recurrent syncope, hospitalized from 11/18 to 02/24/2023 with intraventricular hemorrhage related to a syncopal event who presents by EMS with altered mental status noted when his partner visited him at his facility.  EMS reported that patient had been altered for the past 3 days and his blood glucose readings were high.   Clinical Impression  Patient received standing at bedside with OT and sitter present in room. Patient requires multi modal cues for mobility and safety. He has difficulty taking side steps along edge of bed. Required mod +2 and RW. Difficulty processing. Patient will continue to benefit from skilled PT to improve functional independence.              If plan is discharge home, recommend the following: Two people to help with walking and/or transfers;A lot of help with bathing/dressing/bathroom;Assistance with feeding;Assist for transportation   Can travel by private vehicle   No    Equipment Recommendations None recommended by PT (TBD)  Recommendations for Other Services       Functional Status Assessment Patient has had a recent decline in their functional status and demonstrates the ability to make significant improvements in function in a reasonable and predictable amount of time.     Precautions / Restrictions Precautions Precautions: Fall Precaution Comments: watch orthostatics Restrictions Weight Bearing Restrictions: No      Mobility  Bed Mobility Overal bed mobility: Needs Assistance Bed Mobility: Sit to Supine       Sit to supine: Supervision, Contact  guard assist   General bed mobility comments: patient is able to return from sitting to supine with cga/supervision    Transfers Overall transfer level: Needs assistance Equipment used: Rolling walker (2 wheels) Transfers: Sit to/from Stand Sit to Stand: Mod assist, +2 safety/equipment           General transfer comment: patient able to stand from bed  with mod +2 for safety/initiation    Ambulation/Gait         Gait velocity: decreased     General Gait Details: patient having difficulty side stepping at edge of bed. Required max multi modal cues and assist to weight shift and for safety  Stairs            Wheelchair Mobility     Tilt Bed    Modified Rankin (Stroke Patients Only)       Balance Overall balance assessment: Needs assistance Sitting-balance support: Feet supported Sitting balance-Leahy Scale: Fair       Standing balance-Leahy Scale: Fair Standing balance comment: Requires assistance                             Pertinent Vitals/Pain Pain Assessment Faces Pain Scale: Hurts little more Pain Location: tooth Pain Descriptors / Indicators: Discomfort, Sore Pain Intervention(s): Monitored during session    Home Living Family/patient expects to be discharged to:: Skilled nursing facility                   Additional Comments: patient unable to provide historical information. Per chart he is from SNF    Prior Function  Mobility Comments: unsure ADLs Comments: unsure     Extremity/Trunk Assessment   Upper Extremity Assessment Upper Extremity Assessment: Defer to OT evaluation    Lower Extremity Assessment Lower Extremity Assessment: Generalized weakness    Cervical / Trunk Assessment Cervical / Trunk Assessment: Normal  Communication   Communication Communication: Difficulty following commands/understanding;Difficulty communicating thoughts/reduced clarity of speech Following commands:  Follows one step commands inconsistently;Follows one step commands with increased time Cueing Techniques: Verbal cues;Tactile cues  Cognition Arousal: Lethargic Behavior During Therapy: Flat affect Overall Cognitive Status: Impaired/Different from baseline Area of Impairment: Following commands, Safety/judgement, Awareness, Problem solving, Attention, Memory                 Orientation Level: Disoriented to, Time, Situation, Place Current Attention Level: Focused Memory: Decreased short-term memory Following Commands: Follows one step commands inconsistently, Follows one step commands with increased time Safety/Judgement: Decreased awareness of safety, Decreased awareness of deficits Awareness: Intellectual Problem Solving: Slow processing, Decreased initiation, Requires verbal cues, Requires tactile cues General Comments: patient requires multi modal cues for mobility/safety        General Comments      Exercises     Assessment/Plan    PT Assessment Patient needs continued PT services  PT Problem List Decreased strength;Decreased activity tolerance;Decreased balance;Decreased mobility;Decreased cognition;Decreased safety awareness;Decreased coordination       PT Treatment Interventions DME instruction;Gait training;Functional mobility training;Therapeutic activities;Patient/family education;Balance training;Therapeutic exercise;Cognitive remediation    PT Goals (Current goals can be found in the Care Plan section)  Acute Rehab PT Goals Patient Stated Goal: unable to state PT Goal Formulation: Patient unable to participate in goal setting Time For Goal Achievement: 03/24/23    Frequency Min 1X/week     Co-evaluation               AM-PAC PT "6 Clicks" Mobility  Outcome Measure Help needed turning from your back to your side while in a flat bed without using bedrails?: A Little Help needed moving from lying on your back to sitting on the side of a flat bed  without using bedrails?: A Little Help needed moving to and from a bed to a chair (including a wheelchair)?: A Lot Help needed standing up from a chair using your arms (e.g., wheelchair or bedside chair)?: A Lot Help needed to walk in hospital room?: Total Help needed climbing 3-5 steps with a railing? : Total 6 Click Score: 12    End of Session   Activity Tolerance: Patient limited by fatigue Patient left: in bed;with call bell/phone within reach;with bed alarm set;with nursing/sitter in room Nurse Communication: Mobility status PT Visit Diagnosis: Unsteadiness on feet (R26.81);Muscle weakness (generalized) (M62.81);Other abnormalities of gait and mobility (R26.89)    Time: 1610-9604 PT Time Calculation (min) (ACUTE ONLY): 10 min   Charges:   PT Evaluation $PT Eval Moderate Complexity: 1 Mod   PT General Charges $$ ACUTE PT VISIT: 1 Visit         Amayrani Bennick, PT, GCS 03/10/23,3:53 PM

## 2023-03-10 NOTE — Progress Notes (Signed)
Hypoglycemic Event  CBG: 25 mg/dL  Treatment: E95 50 mL (25 gm)  Symptoms: Sweaty  Follow-up CBG: Time: 0403 Hours CBG Result: 145 mg/dL  Possible Reasons for Event: Medication regimen  Comments/MD notified: Stark Klein, NP    Rosana Fret, MSN, RN, CCRN   Addendum:  0354 hours: Secure chat sent to Stark Klein, NP: "S: FYI, spot-check CBG 25 mg/dL, recheck is 30 mg/dL. B: Patient is here with multi issues, including DKA. A: He received long-acting insulin overnight, no correctional. Intake has been poor. CBGs are only AC/HS but I noticed he looked clammy. Have given 1 amp D50, following hypoglycemia protocol; will recheck at 15 mins. R: Mostly just updating you of what we've already done, but we should probably increase his CBGs frequency up from AC/HS; otherwise would not have been checked until almost 0700 this AM."  0356 hours: Secure chat reply received by this RN from Stark Klein, NP: "Copy. thank you. I will modify it to Q4."

## 2023-03-11 ENCOUNTER — Other Ambulatory Visit: Payer: Self-pay

## 2023-03-11 DIAGNOSIS — E1010 Type 1 diabetes mellitus with ketoacidosis without coma: Secondary | ICD-10-CM | POA: Diagnosis not present

## 2023-03-11 LAB — GLUCOSE, CAPILLARY
Glucose-Capillary: 59 mg/dL — ABNORMAL LOW (ref 70–99)
Glucose-Capillary: 191 mg/dL — ABNORMAL HIGH (ref 70–99)
Glucose-Capillary: 170 mg/dL — ABNORMAL HIGH (ref 70–99)
Glucose-Capillary: 75 mg/dL (ref 70–99)
Glucose-Capillary: 84 mg/dL (ref 70–99)
Glucose-Capillary: 154 mg/dL — ABNORMAL HIGH (ref 70–99)
Glucose-Capillary: 176 mg/dL — ABNORMAL HIGH (ref 70–99)
Glucose-Capillary: 250 mg/dL — ABNORMAL HIGH (ref 70–99)

## 2023-03-11 LAB — CBC
HCT: 26 % — ABNORMAL LOW (ref 39.0–52.0)
Hemoglobin: 8.4 g/dL — ABNORMAL LOW (ref 13.0–17.0)
MCH: 30.8 pg (ref 26.0–34.0)
MCHC: 32.3 g/dL (ref 30.0–36.0)
MCV: 95.2 fL (ref 80.0–100.0)
Platelets: 219 10*3/uL (ref 150–400)
RBC: 2.73 MIL/uL — ABNORMAL LOW (ref 4.22–5.81)
RDW: 16 % — ABNORMAL HIGH (ref 11.5–15.5)
WBC: 2.9 10*3/uL — ABNORMAL LOW (ref 4.0–10.5)
nRBC: 0 % (ref 0.0–0.2)

## 2023-03-11 LAB — BASIC METABOLIC PANEL
Anion gap: 7 (ref 5–15)
BUN: 36 mg/dL — ABNORMAL HIGH (ref 8–23)
CO2: 27 mmol/L (ref 22–32)
Calcium: 8.3 mg/dL — ABNORMAL LOW (ref 8.9–10.3)
Chloride: 108 mmol/L (ref 98–111)
Creatinine, Ser: 1.49 mg/dL — ABNORMAL HIGH (ref 0.61–1.24)
GFR, Estimated: 50 mL/min — ABNORMAL LOW (ref >60)
Glucose, Bld: 174 mg/dL — ABNORMAL HIGH (ref 70–99)
Potassium: 3.8 mmol/L (ref 3.5–5.1)
Sodium: 142 mmol/L (ref 135–145)

## 2023-03-11 MED ORDER — DEXTROSE 50 % IV SOLN
25.0000 g | INTRAVENOUS | Status: AC
  Administered 2023-03-11: 25 g via INTRAVENOUS

## 2023-03-11 NOTE — Plan of Care (Signed)
Problem: Education: Goal: Ability to describe self-care measures that may prevent or decrease complications (Diabetes Survival Skills Education) will improve Outcome: Progressing Goal: Individualized Educational Video(s) Outcome: Progressing   Problem: Coping: Goal: Ability to adjust to condition or change in health will improve Outcome: Progressing   Problem: Fluid Volume: Goal: Ability to maintain a balanced intake and output will improve Outcome: Progressing   Problem: Health Behavior/Discharge Planning: Goal: Ability to identify and utilize available resources and services will improve Outcome: Progressing Goal: Ability to manage health-related needs will improve Outcome: Progressing   Problem: Metabolic: Goal: Ability to maintain appropriate glucose levels will improve Outcome: Progressing   Problem: Nutritional: Goal: Maintenance of adequate nutrition will improve Outcome: Progressing Goal: Progress toward achieving an optimal weight will improve Outcome: Progressing   Problem: Skin Integrity: Goal: Risk for impaired skin integrity will decrease Outcome: Progressing   Problem: Tissue Perfusion: Goal: Adequacy of tissue perfusion will improve Outcome: Progressing   Problem: Education: Goal: Understanding of cardiac disease, CV risk reduction, and recovery process will improve Outcome: Progressing Goal: Individualized Educational Video(s) Outcome: Progressing   Problem: Activity: Goal: Ability to tolerate increased activity will improve Outcome: Progressing   Problem: Cardiac: Goal: Ability to achieve and maintain adequate cardiovascular perfusion will improve Outcome: Progressing   Problem: Health Behavior/Discharge Planning: Goal: Ability to safely manage health-related needs after discharge will improve Outcome: Progressing   Problem: Education: Goal: Ability to describe self-care measures that may prevent or decrease complications (Diabetes Survival  Skills Education) will improve Outcome: Progressing Goal: Individualized Educational Video(s) Outcome: Progressing   Problem: Cardiac: Goal: Ability to maintain an adequate cardiac output will improve Outcome: Progressing   Problem: Health Behavior/Discharge Planning: Goal: Ability to identify and utilize available resources and services will improve Outcome: Progressing Goal: Ability to manage health-related needs will improve Outcome: Progressing   Problem: Fluid Volume: Goal: Ability to achieve a balanced intake and output will improve Outcome: Progressing   Problem: Metabolic: Goal: Ability to maintain appropriate glucose levels will improve Outcome: Progressing   Problem: Nutritional: Goal: Maintenance of adequate nutrition will improve Outcome: Progressing Goal: Maintenance of adequate weight for body size and type will improve Outcome: Progressing   Problem: Respiratory: Goal: Will regain and/or maintain adequate ventilation Outcome: Progressing   Problem: Urinary Elimination: Goal: Ability to achieve and maintain adequate renal perfusion and functioning will improve Outcome: Progressing   Problem: Education: Goal: Knowledge of General Education information will improve Description: Including pain rating scale, medication(s)/side effects and non-pharmacologic comfort measures Outcome: Progressing   Problem: Health Behavior/Discharge Planning: Goal: Ability to manage health-related needs will improve Outcome: Progressing   Problem: Clinical Measurements: Goal: Ability to maintain clinical measurements within normal limits will improve Outcome: Progressing Goal: Will remain free from infection Outcome: Progressing Goal: Diagnostic test results will improve Outcome: Progressing Goal: Respiratory complications will improve Outcome: Progressing Goal: Cardiovascular complication will be avoided Outcome: Progressing   Problem: Activity: Goal: Risk for  activity intolerance will decrease Outcome: Progressing   Problem: Nutrition: Goal: Adequate nutrition will be maintained Outcome: Progressing   Problem: Coping: Goal: Level of anxiety will decrease Outcome: Progressing   Problem: Elimination: Goal: Will not experience complications related to bowel motility Outcome: Progressing Goal: Will not experience complications related to urinary retention Outcome: Progressing   Problem: Pain Management: Goal: General experience of comfort will improve Outcome: Progressing   Problem: Safety: Goal: Ability to remain free from injury will improve Outcome: Progressing   Problem: Skin Integrity: Goal: Risk for impaired skin  integrity will decrease Outcome: Progressing

## 2023-03-11 NOTE — Progress Notes (Addendum)
Progress Note   Patient: Nicholas Weiss:[id number] DOB: 1950/11/08 DOA: 03/08/2023     3 DOS: the patient was seen and examined on 03/11/2023   Brief hospital course: "Nicholas Weiss is a 72 y.o. male with medical history significant for hypertension, prior CVA, multiple myeloma, CKD 3B, paroxysmal A-fib, type 1 diabetes mellitus, A-fib on Eliquis, myoclonus on Keppra, dysautonomia/orthostatic hypotension, with recurrent syncope, hospitalized from 11/18 to 02/24/2023 with intraventricular hemorrhage related to a syncopal event who presents by EMS with altered mental status noted when his partner visited him at his facility.  EMS reported that patient had been altered for the past 3 days and his blood glucose readings were high.  Patient was found to have elevated troponin as well as labs showing DKA and subsequently admitted for further management." See H&P for full HPI on admission & ED course.  Further hospital course and management as outlined below.    Assessment and Plan:  Diabetic ketoacidosis without coma associated with type 1 diabetes mellitus (HCC)-resolved DKA has resolved. Continue subcutaneous insulin & titrate regimen for inpatient goal 140-180 Appreciate diabetes coordinator recommendations Reduced Semglee to 8 units at bedtime Continue sliding scale Novolog AC/HS CBG's   Hypoglycemic episodes Hypoglycemia protocol Reduced insulin as above, dc bedtime sliding scale Encourage PO intake including juice Will start D5 fluids if needed, continue to monitor closely for now    Elevated troponin likely secondary to demand ischemia  Echo this admission with EF 65-70%, no WMAs, mild asymmetric basal-septal LVH  Troponin trended 438-->506 Suspect demand ischemia secondary to DKA as well as elevation from worsening renal function Patient has no chest pain and EKG nonacute Continue on telemetry Heparin drip discontinued by cardiologist as this is unlikely NSTEMI      Acute metabolic encephalopathy Secondary to DKA CT head nonacute, showing resolution of prior intraventricular hemorrhage Neurologic checks Fall and aspiration precautions    Acute renal failure superimposed on stage 3a chronic kidney disease (HCC) Creatinine over 4.11 up from baseline of 1.5 Expecting improvement with IV fluid resuscitation Monitor renal function and avoid nephrotoxins Renal function improving if this were to worsen to consider nephrologist consultation    History of intraventricular hemorrhage following syncopal event 02/15/2023 Restarted on Eliquis 12/3 CT head showing resolution of prior bleed Resumed home Keppra   Atrial fibrillation (HCC) Chronic anticoagulation Resumed on Eliquis Not on beta-blocker due to chronic hypotension   Myoclonus Continue Keppra   Dysautonomia orthostatic hypotension with recurrent syncope Most recent syncopal episode 11/18 Continue midodrine Continue fall precautions Up with assistance only PT/OT    Anemia of chronic kidney failure, stage 3 (moderate) (HCC) Hemoglobin at baseline at 8.2   Essential (primary) hypertension Holding hydralazine as patient also on midodrine for orthostatic hypotension   Multiple myeloma (HCC) No acute disease suspected       DVT prophylaxis: Lovenox   Consults: Cardiology   Advance Care Planning:   Code Status: Prior    Family Communication: none   Disposition Plan: SNF    Subjective:  Pt was sleeping but woke up to voice this AM.  Asking for orange juice.  He states his blood sugars fluctuate a lot at home too, as they have here.  He denies feeling sick, having pain or other acute complaints. States he feels fine.    Physical Exam:  General exam: sleeping but wakes to voice, no acute distress, underweight HEENT: keeps eyes closed, moist mucus membranes, hearing grossly normal  Respiratory system: CTAB, no wheezes,  rales or rhonchi, normal respiratory  effort. Cardiovascular system: normal S1/S2, RRR, no pedal edema.   Gastrointestinal system: soft, NT, ND Central nervous system: exam limited by somnolence, no gross focal deficits, normal speech Extremities: moves all, no edema, normal tone Skin: dry, intact, normal temperature Psychiatry: exam limited by somnolence, calm but limited interaction    Vitals:   03/10/23 2315 03/11/23 0325 03/11/23 0858 03/11/23 1211  BP: (!) 167/97 (!) 170/99 (!) 157/95 (!) 146/94  Pulse: 85 76 79 78  Resp: 20 16 18 18   Temp: 97.8 F (36.6 C) 98.6 F (37 C) 98.3 F (36.8 C) 98.4 F (36.9 C)  TempSrc: Oral Oral Oral Oral  SpO2: 100% 100% 100% 100%  Weight:      Height:        Data Reviewed: I have reviewed patient's CT scan of the brain that showed no acute intracranial pathology I reviewed patient's chest x-ray that did not show any acute intrapulmonary pathology    Latest Ref Rng & Units 03/11/2023    5:40 AM 03/10/2023    5:09 AM 03/09/2023    5:22 PM  BMP  Glucose 70 - 99 mg/dL 782  96  956   BUN 8 - 23 mg/dL 36  59  67   Creatinine 0.61 - 1.24 mg/dL 2.13  0.86  5.78   Sodium 135 - 145 mmol/L 142  145  139   Potassium 3.5 - 5.1 mmol/L 3.8  4.0  5.4   Chloride 98 - 111 mmol/L 108  117  108   CO2 22 - 32 mmol/L 27  23  18    Calcium 8.9 - 10.3 mg/dL 8.3  8.3  8.1        Latest Ref Rng & Units 03/11/2023    5:40 AM 03/10/2023    5:09 AM 03/08/2023    9:26 PM  CBC  WBC 4.0 - 10.5 K/uL 2.9  4.7  8.2   Hemoglobin 13.0 - 17.0 g/dL 8.4  7.0  8.2   Hematocrit 39.0 - 52.0 % 26.0  21.5  26.5   Platelets 150 - 400 K/uL 219  225  186      Family Communication: No family at bedside. Will attempt to call this afternoon as time allows.  Disposition: Status is: Inpatient   Time spent: 42 minutes  Author: Pennie Banter, DO 03/11/2023 1:53 PM  For on call review [email or web address].

## 2023-03-11 NOTE — Inpatient Diabetes Management (Signed)
Inpatient Diabetes Program Recommendations  AACE/ADA: New Consensus Statement on Inpatient Glycemic Control (2015)  Target Ranges:  Prepandial:   less than 140 mg/dL      Peak postprandial:   less than 180 mg/dL (1-2 hours)      Critically ill patients:  140 - 180 mg/dL   Lab Results  Component Value Date   GLUCAP 75 03/11/2023   HGBA1C 9.0 (H) 11/14/2022    Review of Glycemic Control  Latest Reference Range & Units 03/10/23 23:13 03/11/23 03:27 03/11/23 04:16 03/11/23 09:01 03/11/23 12:17 03/11/23 12:27  Glucose-Capillary 70 - 99 mg/dL 962 (H) 59 (L) 952 (H) 170 (H) Novolog 2 units 84 75  (H): Data is abnormally high (L): Data is abnormally low  Diabetes history: DM1 Outpatient Diabetes medications:  Tresiba 5 units every day Humalog 0-9 units TID Current orders for Inpatient glycemic control:  Semglee 8 units QD Novolog 0-9 units TID   Inpatient Diabetes Program Recommendations:    Please consider decreasing correction:  Novolog 0-6 units TID  Will continue to follow while inpatient.  Thank you, Dulce Sellar, MSN, CDCES Diabetes Coordinator Inpatient Diabetes Program [phone] (team pager from 8a-5p)

## 2023-03-11 NOTE — Progress Notes (Addendum)
[facility] CLINIC CARDIOLOGY PROGRESS NOTE       Patient ID: Nicholas Weiss MRN: [id number] DOB/AGE: [birth year] 72 y.o.  Admit date: 03/08/2023 Referring Physician Dr. Lindajo Royal Primary Physician Marisue Ivan, MD  Primary Cardiologist None - seen by [facility] during prior hospitalizations Reason for Consultation elevated troponins  HPI: Nicholas Weiss is a 72 y.o. male  with a past medical history of uncontrolled type 1 diabetes, multiple myeloma, CKD 3, history of CVA, HTN, HLD, cocaine abuse  who presented to the ED on 03/08/2023 for AMS. Found to be in DKA, troponins checked and elevated. Cardiology was consulted for further evaluation.   Interval history: -Patient seen and examined this AM, resting comfortably in bed.  -Reports he feels well this morning, denies any chest pain, SOB, palpitations. -Cr continues to improve. BP and HR stable.  Review of systems complete and found to be negative unless listed above    Past Medical History:  Diagnosis Date   Acute metabolic encephalopathy 12/02/2020   DKA (diabetic ketoacidoses) 04/06/2016   Hypercholesteremia    Hypertension     Past Surgical History:  Procedure Laterality Date   AMPUTATION TOE Left 04/25/2022   Procedure: LEFT GREAT TOE AMPUTATION AND LEFT PARTIAL 2ND TOE AMPUTATION;  Surgeon: Felecia Shelling, DPM;  Location: [facility] ORS;  Service: Podiatry;  Laterality: Left;   COLONOSCOPY WITH PROPOFOL N/A 02/01/2020   Procedure: COLONOSCOPY WITH PROPOFOL;  Surgeon: Toney Reil, MD;  Location: [facility] ENDOSCOPY;  Service: Gastroenterology;  Laterality: N/A;   ESOPHAGOGASTRODUODENOSCOPY  02/01/2020   Procedure: ESOPHAGOGASTRODUODENOSCOPY (EGD);  Surgeon: Toney Reil, MD;  Location: [facility] ENDOSCOPY;  Service: Gastroenterology;;   ESOPHAGOGASTRODUODENOSCOPY (EGD) WITH PROPOFOL N/A 06/07/2022   Procedure: ESOPHAGOGASTRODUODENOSCOPY (EGD) WITH PROPOFOL;  Surgeon: Kerin Salen, MD;  Location: [facility] ENDOSCOPY;  Service:  Gastroenterology;  Laterality: N/A;   KNEE SURGERY Right    Torn meniscus   KNEE SURGERY Left     Medications Prior to Admission  Medication Sig Dispense Refill Last Dose/Taking   acetaminophen (TYLENOL) 500 MG tablet Take 1,000 mg by mouth daily as needed for mild pain or moderate pain.      apixaban (ELIQUIS) 5 MG TABS tablet Take 1 tablet (5 mg total) by mouth 2 (two) times daily. Start Eliquis after 2 weeks which will be 03/02/2023      ascorbic acid (VITAMIN C) 500 MG tablet Take 1 tablet (500 mg total) by mouth daily. 90 tablet 0    atorvastatin (LIPITOR) 80 MG tablet Take 1 tablet (80 mg total) by mouth at bedtime. Hold while taking Paxlovid 30 tablet 2    bisacodyl 5 MG EC tablet Take 1 tablet (5 mg total) by mouth at bedtime. Skip the dose if no constipation      fludrocortisone (FLORINEF) 0.1 MG tablet Take 1 tablet (0.1 mg total) by mouth daily. Skip the dose if SBP >130      hydrALAZINE (APRESOLINE) 25 MG tablet Take 1 tablet (25 mg total) by mouth 3 (three) times daily as needed (SBP >160).      insulin degludec (TRESIBA FLEXTOUCH) 100 UNIT/ML FlexTouch Pen Inject 5 Units into the skin daily. Home med.      levETIRAcetam (KEPPRA) 500 MG tablet Take 1 tablet (500 mg total) by mouth 2 (two) times daily. (Patient not taking: Reported on 02/16/2023) 60 tablet 2    midodrine (PROAMATINE) 10 MG tablet Take 1 tablet (10 mg total) by mouth 3 (three) times daily with meals. Hold if SBP >140  Multiple Vitamin (MULTIVITAMIN WITH MINERALS) TABS tablet Take 1 tablet by mouth daily. 90 tablet 1    NOVOLOG 100 UNIT/ML injection Sliding Scale: Glucose 70 - 120: 0 units  Glucose 121 - 150: 1 unit  Glucose 151 - 200: 2 units Glucose 201 - 250: 3 units  Glucose 251 - 300: 5 units  Glucose 301 - 350: 7 units  Glucose 351 - 400: 9 units  Glucose > 400: call your docotr (Patient not taking: Reported on 02/16/2023) 10 mL 1    ondansetron (ZOFRAN) 8 MG tablet Take 8 mg by mouth daily as needed for  nausea or vomiting.      polyethylene glycol (MIRALAX) 17 g packet Take 17 g by mouth 2 (two) times daily. Skip the dose if no constipation      QUEtiapine (SEROQUEL) 25 MG tablet Take 1 tablet (25 mg total) by mouth every evening.      traMADol (ULTRAM) 50 MG tablet Take 1 tablet (50 mg total) by mouth every 6 (six) hours as needed. 10 tablet 0    traZODone (DESYREL) 50 MG tablet Take 0.5 tablets (25 mg total) by mouth at bedtime as needed for sleep.      Vitamin D, Ergocalciferol, (DRISDOL) 1.25 MG (50000 UNIT) CAPS capsule Take 1 capsule (50,000 Units total) by mouth every 7 (seven) days.      Social History   Socioeconomic History   Marital status: Single    Spouse name: Not on file   Number of children: 3   Years of education: Not on file   Highest education level: High school graduate  Occupational History    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  Tobacco Use   Smoking status: Never   Smokeless tobacco: Never  Vaping Use   Vaping status: Never Used  Substance and Sexual Activity   Alcohol use: Not Currently    Alcohol/week: 0.0 - 1.0 standard drinks of alcohol    Comment: "once every 2 months"   Drug use: Yes    Types: Marijuana, "Crack" cocaine    Comment: last week   Sexual activity: Not Currently    Birth control/protection: None  Other Topics Concern   Not on file  Social History Narrative   Lives with girlfriend "sharon"   Social Drivers of Health   Financial Resource Strain: Low Risk  (01/26/2023)   Received from [facility]   Overall Financial Resource Strain (CARDIA)    Difficulty of Paying Living Expenses: Not very hard  Food Insecurity: Patient Unable To Answer (03/09/2023)   Hunger Vital Sign    Worried About Running Out of Food in the Last Year: Patient unable to answer    Ran Out of Food in the Last Year: Patient unable to answer  Transportation Needs: Patient Unable To Answer (03/09/2023)   PRAPARE - Transportation    Lack of Transportation (Medical):  Patient unable to answer    Lack of Transportation (Non-Medical): Patient unable to answer  Physical Activity: Insufficiently Active (01/01/2020)   Exercise Vital Sign    Days of Exercise per Week: 7 days    Minutes of Exercise per Session: 20 min  Stress: No Stress Concern Present (01/01/2020)   [employer] of Occupational Health - Occupational Stress Questionnaire    Feeling of Stress : Not at all  Social Connections: Moderately Isolated (01/01/2020)   Social Connection and Isolation Panel [NHANES]    Frequency of Communication with Friends and Family: More than three times a week  Frequency of Social Gatherings with Friends and Family: More than three times a week    Attends Religious Services: Never    Database administrator or Organizations: No    Attends Banker Meetings: Never    Marital Status: Living with partner  Intimate Partner Violence: Patient Unable To Answer (03/09/2023)   Humiliation, Afraid, Rape, and Kick questionnaire    Fear of Current or Ex-Partner: Patient unable to answer    Emotionally Abused: Patient unable to answer    Physically Abused: Patient unable to answer    Sexually Abused: Patient unable to answer    Family History  Problem Relation Age of Onset   Heart attack Father    Hypertension Sister    Cancer Sister      Vitals:   03/10/23 1959 03/10/23 2315 03/11/23 0325 03/11/23 0858  BP: (!) 162/105 (!) 167/97 (!) 170/99 (!) 157/95  Pulse: 87 85 76 79  Resp: 18 20 16 18   Temp: 98.3 F (36.8 C) 97.8 F (36.6 C) 98.6 F (37 C) 98.3 F (36.8 C)  TempSrc:  Oral Oral Oral  SpO2: 100% 100% 100% 100%  Weight:      Height:        PHYSICAL EXAM General: Chronically ill-appearing, well nourished, in no acute distress resting comfortably in hospital bed. HEENT: Normocephalic and atraumatic. Neck: No JVD.  Lungs: Normal respiratory effort on room air. Clear bilaterally to auscultation. No wheezes, crackles, rhonchi.  Heart: HRRR.  Normal S1 and S2 without gallops or murmurs.  Abdomen: Non-distended appearing.  Msk: Normal strength and tone for age. Extremities: Warm and well perfused. No clubbing, cyanosis. No edema.  Neuro: Alert and oriented X 3. Psych: Answers questions appropriately.   Labs: Basic Metabolic Panel: Recent Labs    03/10/23 0509 03/11/23 0540  NA 145 142  K 4.0 3.8  CL 117* 108  CO2 23 27  GLUCOSE 96 174*  BUN 59* 36*  CREATININE 2.42* 1.49*  CALCIUM 8.3* 8.3*   Liver Function Tests: Recent Labs    03/08/23 2257  AST 31  ALT 26  ALKPHOS 89  BILITOT 1.2*  PROT 9.0*  ALBUMIN 3.8   No results for input(s): "LIPASE", "AMYLASE" in the last 72 hours. CBC: Recent Labs    03/10/23 0509 03/11/23 0540  WBC 4.7 2.9*  NEUTROABS 3.9  --   HGB 7.0* 8.4*  HCT 21.5* 26.0*  MCV 93.1 95.2  PLT 225 219   Cardiac Enzymes: Recent Labs    03/08/23 2126 03/08/23 2257  TROPONINIHS 438* 506*   BNP: No results for input(s): "BNP" in the last 72 hours. D-Dimer: No results for input(s): "DDIMER" in the last 72 hours. Hemoglobin A1C: No results for input(s): "HGBA1C" in the last 72 hours. Fasting Lipid Panel: No results for input(s): "CHOL", "HDL", "LDLCALC", "TRIG", "CHOLHDL", "LDLDIRECT" in the last 72 hours. Thyroid Function Tests: No results for input(s): "TSH", "T4TOTAL", "T3FREE", "THYROIDAB" in the last 72 hours.  Invalid input(s): "FREET3" Anemia Panel: No results for input(s): "VITAMINB12", "FOLATE", "FERRITIN", "TIBC", "IRON", "RETICCTPCT" in the last 72 hours.   Radiology: ECHOCARDIOGRAM COMPLETE Result Date: 03/10/2023    ECHOCARDIOGRAM REPORT   Patient Name:   Nicholas Weiss Date of Exam: 03/10/2023 Medical Rec #:  [id number]       Height:       70.0 in Accession #:    [id number]      Weight:       138.0 lb Date of Birth:  1950-05-26      BSA:          1.783 m Patient Age:    71 years        BP:           156/86 mmHg Patient Gender: M               HR:           87 bpm.  Exam Location:  [facility] Procedure: 2D Echo, Cardiac Doppler and Color Doppler Indications:     Elevated Troponin  History:         Patient has prior history of Echocardiogram examinations, most                  recent 08/19/2022. Acute MI, Stroke, Arrythmias:Atrial                  Fibrillation, Signs/Symptoms:Dizziness/Lightheadedness, Syncope                  and Bacteremia; Risk Factors:Hypertension, Diabetes and                  Dyslipidemia. Substance abuse, CKD.  Sonographer:     Mikki Harbor Referring Phys:  [id number] Medford Staheli Diagnosing Phys: Alwyn Pea MD  Sonographer Comments: Image acquisition challenging due to uncooperative patient. IMPRESSIONS  1. Left ventricular ejection fraction, by estimation, is 65 to 70%. The left ventricle has normal function. The left ventricle has no regional wall motion abnormalities. There is mild asymmetric left ventricular hypertrophy of the basal-septal segment. Left ventricular diastolic parameters are consistent with Grade I diastolic dysfunction (impaired relaxation).  2. Right ventricular systolic function is normal. The right ventricular size is normal. There is normal pulmonary artery systolic pressure.  3. Left atrial size was mildly dilated.  4. Right atrial size was mildly dilated.  5. The mitral valve is normal in structure. No evidence of mitral valve regurgitation.  6. The aortic valve is normal in structure. Aortic valve regurgitation is not visualized. FINDINGS  Left Ventricle: Left ventricular ejection fraction, by estimation, is 65 to 70%. The left ventricle has normal function. The left ventricle has no regional wall motion abnormalities. The left ventricular internal cavity size was normal in size. There is  mild asymmetric left ventricular hypertrophy of the basal-septal segment. Left ventricular diastolic parameters are consistent with Grade I diastolic dysfunction (impaired relaxation). Right Ventricle: The right ventricular size is normal.  No increase in right ventricular wall thickness. Right ventricular systolic function is normal. There is normal pulmonary artery systolic pressure. The tricuspid regurgitant velocity is 2.49 m/s, and  with an assumed right atrial pressure of 8 mmHg, the estimated right ventricular systolic pressure is 32.8 mmHg. Left Atrium: Left atrial size was mildly dilated. Right Atrium: Right atrial size was mildly dilated. Pericardium: There is no evidence of pericardial effusion. Mitral Valve: The mitral valve is normal in structure. No evidence of mitral valve regurgitation. MV peak gradient, 9.6 mmHg. The mean mitral valve gradient is 2.0 mmHg. Tricuspid Valve: The tricuspid valve is normal in structure. Tricuspid valve regurgitation is trivial. Aortic Valve: The aortic valve is normal in structure. Aortic valve regurgitation is not visualized. Aortic valve mean gradient measures 2.0 mmHg. Aortic valve peak gradient measures 4.1 mmHg. Aortic valve area, by VTI measures 3.29 cm. Pulmonic Valve: The pulmonic valve was normal in structure. Pulmonic valve regurgitation is not visualized. Aorta: The ascending aorta was not well visualized. IAS/Shunts: No atrial level shunt  detected by color flow Doppler.  LEFT VENTRICLE PLAX 2D LVIDd:         3.90 cm LVIDs:         2.40 cm LV PW:         1.20 cm LV IVS:        1.50 cm LVOT diam:     2.40 cm LV SV:         61 LV SV Index:   34 LVOT Area:     4.52 cm  RIGHT VENTRICLE RV Basal diam:  3.80 cm RV Mid diam:    3.30 cm TAPSE (M-mode): 2.4 cm LEFT ATRIUM             Index        RIGHT ATRIUM           Index LA diam:        4.40 cm 2.47 cm/m   RA Area:     15.20 cm LA Vol (A2C):   84.9 ml 47.62 ml/m  RA Volume:   38.50 ml  21.59 ml/m LA Vol (A4C):   60.1 ml 33.71 ml/m LA Biplane Vol: 71.3 ml 39.99 ml/m  AORTIC VALVE                    PULMONIC VALVE AV Area (Vmax):    3.01 cm     PV Vmax:       0.82 m/s AV Area (Vmean):   3.57 cm     PV Peak grad:  2.7 mmHg AV Area (VTI):      3.29 cm AV Vmax:           101.00 cm/s AV Vmean:          57.100 cm/s AV VTI:            0.184 m AV Peak Grad:      4.1 mmHg AV Mean Grad:      2.0 mmHg LVOT Vmax:         67.10 cm/s LVOT Vmean:        45.000 cm/s LVOT VTI:          0.134 m LVOT/AV VTI ratio: 0.73  AORTA Ao Root diam: 3.70 cm Ao Asc diam:  3.50 cm MITRAL VALVE                TRICUSPID VALVE MV Area (PHT): 7.16 cm     TR Peak grad:   24.8 mmHg MV Area VTI:   2.14 cm     TR Vmax:        249.00 cm/s MV Peak grad:  9.6 mmHg MV Mean grad:  2.0 mmHg     SHUNTS MV Vmax:       1.55 m/s     Systemic VTI:  0.13 m MV Vmean:      60.1 cm/s    Systemic Diam: 2.40 cm MV Decel Time: 106 msec MV E velocity: 122.00 cm/s Alwyn Pea MD Electronically signed by Alwyn Pea MD Signature Date/Time: 03/10/2023/12:56:50 PM    Final    DG Chest Port 1 View Result Date: 03/09/2023 CLINICAL DATA:  DKA EXAM: PORTABLE CHEST 1 VIEW COMPARISON:  02/12/2023 FINDINGS: Heart and mediastinal contours are within normal limits. No focal opacities or effusions. No acute bony abnormality. Aortic atherosclerosis. IMPRESSION: No active disease. Electronically Signed   By: Charlett Nose M.D.   On: 03/09/2023 01:10   CT Head Wo Contrast Result Date: 03/08/2023 CLINICAL DATA:  Follow-up recent intracranial hemorrhage. EXAM: CT HEAD WITHOUT CONTRAST TECHNIQUE: Contiguous axial images were obtained from the base of the skull through the vertex without intravenous contrast. RADIATION DOSE REDUCTION: This exam was performed according to the departmental dose-optimization program which includes automated exposure control, adjustment of the mA and/or kV according to patient size and/or use of iterative reconstruction technique. COMPARISON:  Head CT dated 02/20/2023. FINDINGS: Evaluation of this exam is limited due to motion artifact. Brain: Mild age-related atrophy and moderate chronic microvascular ischemic changes. Interval resolution of the previously seen intraventricular  hemorrhage. No acute intracranial hemorrhage. No mass effect or midline shift. No extra-axial fluid collection. Vascular: No hyperdense vessel or unexpected calcification. Skull: Normal. Negative for fracture or focal lesion. Sinuses/Orbits: No acute finding. Other: None IMPRESSION: 1. No acute intracranial pathology. 2. Interval resolution of the previously seen intraventricular hemorrhage. 3. Mild age-related atrophy and moderate chronic microvascular ischemic changes. Electronically Signed   By: Elgie Collard M.D.   On: 03/08/2023 22:24   US RENAL Result Date: 02/20/2023 CLINICAL DATA:  [id number] CKD (chronic kidney disease) stage 3, GFR 30-59 ml/min (HCC) [id number] EXAM: RENAL / URINARY TRACT ULTRASOUND COMPLETE COMPARISON:  December 01, 2020 FINDINGS: Evaluation is limited by limited acoustic windows and shadowing bowel gas Right Kidney: Renal measurements: 9.3 x 3.7 x 4.5 cm = volume: 82 mL. Echogenicity is slightly increased, similar comparison to prior. No mass or hydronephrosis visualized. Left Kidney: Renal measurements: 9.2 x 5.4 x 5.8 cm = volume: 150 mL. Echogenicity within normal limits. No mass or hydronephrosis visualized. Bladder: Appears normal for degree of bladder distention. Prevoid volume 337 ML. Postvoid volume 102 ML. Other: None. IMPRESSION: 1. No hydronephrosis. 2. Postvoid residual of 102 mL. Electronically Signed   By: Meda Klinefelter M.D.   On: 02/20/2023 11:56   CT HEAD WO CONTRAST ( ) Result Date: 02/20/2023 CLINICAL DATA:  72 year old male status post fall. Recent right frontal horn intraventricular hemorrhage after a fall. EXAM: CT HEAD WITHOUT CONTRAST TECHNIQUE: Contiguous axial images were obtained from the base of the skull through the vertex without intravenous contrast. RADIATION DOSE REDUCTION: This exam was performed according to the departmental dose-optimization program which includes automated exposure control, adjustment of the mA and/or kV according to patient  size and/or use of iterative reconstruction technique. COMPARISON:  Head CT 02/16/2023 and earlier. FINDINGS: Brain: Ongoing right lateral intraventricular hemorrhage, with mild extension since 02/16/2023. Hyperdense blood now surrounds most of the right choroid plexus. Some of this has read distributed from the right frontal horn. But the hemorrhage volume has increased. Minimal extension into the 3rd ventricle (series 2, image 15). But stable ventricle size and configuration. No ventriculomegaly. Left lateral and 4th ventricles remain spared. No transependymal edema. No superimposed midline shift, mass effect, evidence of mass lesion,or evidence of cortically based acute infarction. Patchy bilateral white matter hypodensity is stable. Vascular: No suspicious intracranial vascular hyperdensity. Calcified atherosclerosis at the skull base. Skull: Stable and intact. Sinuses/Orbits: Visualized paranasal sinuses and mastoids are stable and well aerated. Other: Stable orbit and scalp soft tissues. No acute injury identified. IMPRESSION: 1. Increased volume of right lateral intraventricular hemorrhage since 02/16/2023, now moderate. But still the blood is fairly isolated to that ventricle; minimal extension into the 3rd ventricle. No associated ventriculomegaly or transependymal edema. 2. Underlying chronic small vessel disease. No other acute intracranial abnormality. Electronically Signed   By: Odessa Fleming M.D.   On: 02/20/2023 05:04   CT HEAD WO CONTRAST ( ) Result Date: 02/16/2023 CLINICAL  DATA:  Follow-up hemorrhage EXAM: CT HEAD WITHOUT CONTRAST TECHNIQUE: Contiguous axial images were obtained from the base of the skull through the vertex without intravenous contrast. RADIATION DOSE REDUCTION: This exam was performed according to the departmental dose-optimization program which includes automated exposure control, adjustment of the mA and/or kV according to patient size and/or use of iterative reconstruction  technique. COMPARISON:  02/15/2023 FINDINGS: Brain: Again seen is the blood within the anterior horn of the right lateral ventricle, unchanged. No new areas of hemorrhage. No hydrocephalus. No acute infarct. No mass effect or midline shift. Vascular: No hyperdense vessel or unexpected calcification. Skull: No acute calvarial abnormality. Sinuses/Orbits: No acute findings Other: None IMPRESSION: Stable hemorrhage in the anterior horn of the right lateral ventricle. Electronically Signed   By: Charlett Nose M.D.   On: 02/16/2023 03:49   CT ANGIO HEAD NECK W WO CM Result Date: 02/16/2023 CLINICAL DATA:  Hemorrhage in the right frontal horn on CT head EXAM: CT ANGIOGRAPHY HEAD AND NECK WITH AND WITHOUT CONTRAST TECHNIQUE: Multidetector CT imaging of the head and neck was performed using the standard protocol during bolus administration of intravenous contrast. Multiplanar CT image reconstructions and MIPs were obtained to evaluate the vascular anatomy. Carotid stenosis measurements (when applicable) are obtained utilizing NASCET criteria, using the distal internal carotid diameter as the denominator. RADIATION DOSE REDUCTION: This exam was performed according to the departmental dose-optimization program which includes automated exposure control, adjustment of the mA and/or kV according to patient size and/or use of iterative reconstruction technique. CONTRAST:  75mL OMNIPAQUE IOHEXOL 350 MG/ML SOLN COMPARISON:  No prior CTA available, correlation is made with 08/10/2021 MRA head and neck and 02/05/2023 CT head FINDINGS: CT HEAD FINDINGS For noncontrast findings, please see same day CT head. No evidence of active extravasation in right frontal horn hemorrhage. Multiple dural based enhancing lesions, the largest of which measures up to 9 mm (series 7, image 69 and 107), with smaller lesions along the left cerebral convexity (series 7, images 86, 96, and 98) and falx (series 7, image 74, for example), which correlate  with calcified lesions on the prior head CT, most likely small meningiomas, without significant mass effect. CTA NECK FINDINGS Aortic arch: Standard branching. Imaged portion shows no evidence of aneurysm or dissection. No significant stenosis of the major arch vessel origins. Mild aortic atherosclerosis. Right carotid system: 50% stenosis in the proximal right ICA, just distal to the bifurcation (series 7, image 221). No evidence of dissection. Left carotid system: 50% stenosis in the proximal left ICA, just distal to the bifurcation (series 7, image 210). No evidence of dissection. Vertebral arteries: Severe stenosis at the origin of right vertebral artery (series 9, image 62). The right vertebral artery is otherwise patent to the skull base. The left vertebral artery is patent from its origin to the skull base without significant stenosis. No evidence of dissection. Skeleton: No acute osseous abnormality. Degenerative changes in the cervical spine. Edentulous with calcifications but Other neck: No acute finding. Upper chest: No focal pulmonary opacity or pleural effusion. Review of the MIP images confirms the above findings CTA HEAD FINDINGS Anterior circulation: Both internal carotid arteries are patent to the termini, without significant stenosis. A1 segments patent, hypoplastic on the right. Normal anterior communicating artery. Anterior cerebral arteries are patent to their distal aspects without significant stenosis. No M1 stenosis or occlusion. MCA branches perfused to their distal aspects without significant stenosis. Posterior circulation: The bilateral V4 segments primarily supply the PICAs, with diminutive  distal V4 segments patent to vertebrobasilar junction. Basilar patent to its distal aspect without significant stenosis. Superior cerebellar arteries patent proximally. Patent, hypoplastic P1 segments. Near fetal origin of the bilateral PCAs from the posterior communicating arteries. PCAs perfused to  their distal aspects without significant stenosis. Venous sinuses: As permitted by contrast timing, patent. Anatomic variants: Near fetal origin of the bilateral PCAs. No evidence of aneurysm or vascular malformation. Review of the MIP images confirms the above findings IMPRESSION: 1. No evidence of active extravasation in the right frontal horn hemorrhage. 2. No intracranial large vessel occlusion or significant stenosis. 3. 50% stenosis in the proximal bilateral ICAs, just distal to the bifurcations. 4. Severe stenosis at the origin of the right vertebral artery. 5. Multiple dural based enhancing lesions, the largest of which measures up to 9 mm, which correlate with calcified lesions on the prior head CT, most likely small meningiomas, without significant mass effect. 6. Aortic atherosclerosis. Aortic Atherosclerosis (ICD10-I70.0). Electronically Signed   By: Wiliam Ke M.D.   On: 02/16/2023 00:35   CT HEAD WO CONTRAST ( ) Result Date: 02/15/2023 CLINICAL DATA:  Recent fall, with subsequent syncopal event, headache and neck pain EXAM: CT HEAD WITHOUT CONTRAST CT CERVICAL SPINE WITHOUT CONTRAST TECHNIQUE: Multidetector CT imaging of the head and cervical spine was performed following the standard protocol without intravenous contrast. Multiplanar CT image reconstructions of the cervical spine were also generated. RADIATION DOSE REDUCTION: This exam was performed according to the departmental dose-optimization program which includes automated exposure control, adjustment of the mA and/or kV according to patient size and/or use of iterative reconstruction technique. COMPARISON:  02/12/2023 CT head and cervical spine FINDINGS: CT HEAD FINDINGS Brain: Small amount of hyperdense hemorrhage in the right frontal horn (series 2, image 11), which is new from the 02/12/2023 exam, of indeterminate etiology. No hydrocephalus. No evidence of parenchymal hemorrhage, subdural hemorrhage, or subarachnoid hemorrhage. No  acute infarct, mass, mass effect, or midline shift. Vascular: No hyperdense vessel. Skull: Negative for fracture or focal lesion. Sinuses/Orbits: No acute finding. Other: The mastoid air cells are well aerated. CT CERVICAL SPINE FINDINGS Alignment: No traumatic listhesis. Straightening and mild reversal of the normal cervical lordosis. Trace anterolisthesis of C3 on C4 and trace retrolisthesis of C5 on C6 and C6 on C7. Skull base and vertebrae: No acute fracture or suspicious osseous lesion. Soft tissues and spinal canal: No prevertebral fluid or swelling. No visible canal hematoma. Disc levels: Degenerative changes in the cervical spine.No high-grade spinal canal stenosis. Upper chest: For findings in the thorax, please see same day CT chest. IMPRESSION: 1. Small amount of hyperdense hemorrhage in the right frontal horn, which is new from the 02/12/2023 exam, of indeterminate etiology. No hydrocephalus. 2. No acute fracture or traumatic listhesis in the cervical spine. These results were called by telephone at the time of interpretation on 02/15/2023 at 10:07 pm to provider [facility] , who verbally acknowledged these results. Electronically Signed   By: Wiliam Ke M.D.   On: 02/15/2023 22:07   CT Cervical Spine Wo Contrast Result Date: 02/15/2023 CLINICAL DATA:  Recent fall, with subsequent syncopal event, headache and neck pain EXAM: CT HEAD WITHOUT CONTRAST CT CERVICAL SPINE WITHOUT CONTRAST TECHNIQUE: Multidetector CT imaging of the head and cervical spine was performed following the standard protocol without intravenous contrast. Multiplanar CT image reconstructions of the cervical spine were also generated. RADIATION DOSE REDUCTION: This exam was performed according to the departmental dose-optimization program which includes automated exposure control, adjustment of  the mA and/or kV according to patient size and/or use of iterative reconstruction technique. COMPARISON:  02/12/2023 CT head and cervical  spine FINDINGS: CT HEAD FINDINGS Brain: Small amount of hyperdense hemorrhage in the right frontal horn (series 2, image 11), which is new from the 02/12/2023 exam, of indeterminate etiology. No hydrocephalus. No evidence of parenchymal hemorrhage, subdural hemorrhage, or subarachnoid hemorrhage. No acute infarct, mass, mass effect, or midline shift. Vascular: No hyperdense vessel. Skull: Negative for fracture or focal lesion. Sinuses/Orbits: No acute finding. Other: The mastoid air cells are well aerated. CT CERVICAL SPINE FINDINGS Alignment: No traumatic listhesis. Straightening and mild reversal of the normal cervical lordosis. Trace anterolisthesis of C3 on C4 and trace retrolisthesis of C5 on C6 and C6 on C7. Skull base and vertebrae: No acute fracture or suspicious osseous lesion. Soft tissues and spinal canal: No prevertebral fluid or swelling. No visible canal hematoma. Disc levels: Degenerative changes in the cervical spine.No high-grade spinal canal stenosis. Upper chest: For findings in the thorax, please see same day CT chest. IMPRESSION: 1. Small amount of hyperdense hemorrhage in the right frontal horn, which is new from the 02/12/2023 exam, of indeterminate etiology. No hydrocephalus. 2. No acute fracture or traumatic listhesis in the cervical spine. These results were called by telephone at the time of interpretation on 02/15/2023 at 10:07 pm to provider [facility] , who verbally acknowledged these results. Electronically Signed   By: Wiliam Ke M.D.   On: 02/15/2023 22:07   CT Chest Wo Contrast Result Date: 02/15/2023 CLINICAL DATA:  Chest trauma and pain. EXAM: CT CHEST WITHOUT CONTRAST TECHNIQUE: Multidetector CT imaging of the chest was performed following the standard protocol without IV contrast. RADIATION DOSE REDUCTION: This exam was performed according to the departmental dose-optimization program which includes automated exposure control, adjustment of the mA and/or kV according to  patient size and/or use of iterative reconstruction technique. COMPARISON:  Chest CT dated 02/17/2022. FINDINGS: Evaluation of this exam is limited in the absence of intravenous contrast. Cardiovascular: There is no cardiomegaly. Small pericardial effusion. Advanced 3 vessel coronary vascular calcification. Mild atherosclerotic calcification of the thoracic aorta. No aneurysmal dilatation. The central pulmonary arteries are grossly unremarkable on this noncontrast CT. Mediastinum/Nodes: No hilar or mediastinal adenopathy. The esophagus is grossly unremarkable. No mediastinal fluid collection. Lungs/Pleura: Bibasilar linear atelectasis/scarring. Small left lower lobe subpleural calcified granuloma. No focal consolidation, pleural effusion or pneumothorax. The central airways are patent. Upper Abdomen: A 3 mm nonobstructing right renal upper pole calculus. Probable small cyst in the dome of the liver. Musculoskeletal: Osteopenia. Old compression fracture of superior endplate of L1. No acute osseous pathology. Acute fractures of the lateral right 8-10th ribs. IMPRESSION: 1. Acute fractures of the lateral right 8-10th ribs. No pneumothorax. 2. Advanced coronary vascular calcification. 3. A 3 mm nonobstructing right renal upper pole calculus. 4.  Aortic Atherosclerosis (ICD10-I70.0). Electronically Signed   By: Elgie Collard M.D.   On: 02/15/2023 22:01   DG Ribs Unilateral W/Chest Right Result Date: 02/12/2023 CLINICAL DATA:  Fall. EXAM: RIGHT RIBS AND CHEST - 3+ VIEW COMPARISON:  09/18/2022. FINDINGS: Bilateral lung fields are clear. Bilateral costophrenic angles are clear. Normal cardio-mediastinal silhouette. There is acute minimally displaced fracture of the posterolateral right tenth rib. No other acute displaced rib fracture. The soft tissues are within normal limits. IMPRESSION: *There is a minimally displaced fracture of the posterolateral right tenth rib. Otherwise no acute cardiopulmonary abnormality  Electronically Signed   By: Timoteo Expose.D.  On: 02/12/2023 16:25   CT Head Wo Contrast Result Date: 02/12/2023 CLINICAL DATA:  Head trauma, minor (Age >= 65y); Neck trauma (Age >= 65y) EXAM: CT HEAD WITHOUT CONTRAST CT CERVICAL SPINE WITHOUT CONTRAST TECHNIQUE: Multidetector CT imaging of the head and cervical spine was performed following the standard protocol without intravenous contrast. Multiplanar CT image reconstructions of the cervical spine were also generated. RADIATION DOSE REDUCTION: This exam was performed according to the departmental dose-optimization program which includes automated exposure control, adjustment of the mA and/or kV according to patient size and/or use of iterative reconstruction technique. COMPARISON:  None Available. FINDINGS: CT HEAD FINDINGS Brain: No evidence of acute infarction, hemorrhage, hydrocephalus, extra-axial collection or mass lesion/mass effect. Vascular: No hyperdense vessel. Skull: No acute fracture. Sinuses/Orbits: Clear sinuses.  No acute orbital finding. Other: No mastoid effusions. CT CERVICAL SPINE FINDINGS Alignment: Mild, likely degenerative retrolisthesis at C5-C6 and C6-C7. Otherwise, no substantial sagittal subluxation. Skull base and vertebrae: No evidence of acute fracture. Vertebral body heights are maintained. Soft tissues and spinal canal: No prevertebral fluid or swelling. No visible canal hematoma. Disc levels: Moderate multilevel degenerative change, greatest at C5-C6 and C6-C7. Upper chest: Visualized lung apices are clear. IMPRESSION: No evidence of acute abnormality intracranially or in the cervical spine. Electronically Signed   By: Feliberto Harts M.D.   On: 02/12/2023 12:56   CT Cervical Spine Wo Contrast Result Date: 02/12/2023 CLINICAL DATA:  Head trauma, minor (Age >= 65y); Neck trauma (Age >= 65y) EXAM: CT HEAD WITHOUT CONTRAST CT CERVICAL SPINE WITHOUT CONTRAST TECHNIQUE: Multidetector CT imaging of the head and cervical  spine was performed following the standard protocol without intravenous contrast. Multiplanar CT image reconstructions of the cervical spine were also generated. RADIATION DOSE REDUCTION: This exam was performed according to the departmental dose-optimization program which includes automated exposure control, adjustment of the mA and/or kV according to patient size and/or use of iterative reconstruction technique. COMPARISON:  None Available. FINDINGS: CT HEAD FINDINGS Brain: No evidence of acute infarction, hemorrhage, hydrocephalus, extra-axial collection or mass lesion/mass effect. Vascular: No hyperdense vessel. Skull: No acute fracture. Sinuses/Orbits: Clear sinuses.  No acute orbital finding. Other: No mastoid effusions. CT CERVICAL SPINE FINDINGS Alignment: Mild, likely degenerative retrolisthesis at C5-C6 and C6-C7. Otherwise, no substantial sagittal subluxation. Skull base and vertebrae: No evidence of acute fracture. Vertebral body heights are maintained. Soft tissues and spinal canal: No prevertebral fluid or swelling. No visible canal hematoma. Disc levels: Moderate multilevel degenerative change, greatest at C5-C6 and C6-C7. Upper chest: Visualized lung apices are clear. IMPRESSION: No evidence of acute abnormality intracranially or in the cervical spine. Electronically Signed   By: Feliberto Harts M.D.   On: 02/12/2023 12:56    ECHO as above  TELEMETRY reviewed by me 03/11/2023: Sinus rhythm rate 70s  EKG reviewed by me: NSR rate 99 bpm, nonischemic  Data reviewed by me 03/11/2023: last 24h vitals tele labs imaging I/O hospitalist progress note  Principal Problem:   Diabetic ketoacidosis without coma associated with type 1 diabetes mellitus (HCC) Active Problems:   Essential (primary) hypertension   Acute renal failure superimposed on stage 3a chronic kidney disease (HCC)   Anemia of chronic kidney failure, stage 3 (moderate) (HCC)   Acute metabolic encephalopathy   Multiple myeloma  (HCC)   Dysautonomia orthostatic hypotension with recurrent syncope   History of intraventricular hemorrhage following syncopal event 02/15/2023   Myoclonus   Elevated troponin   Atrial fibrillation (HCC)   Chronic anticoagulation   NSTEMI (  non-ST elevated myocardial infarction) [facility])    ASSESSMENT AND PLAN:  Nicholas Weiss is a 72 y.o. male  with a past medical history of uncontrolled type 1 diabetes, multiple myeloma, CKD 3, history of CVA, HTN, HLD, cocaine abuse  who presented to the ED on 03/08/2023 for AMS. Found to be in DKA, troponins checked and elevated. Cardiology was consulted for further evaluation.   # Diabetic ketoacidosis # Type 1 diabetes, poorly controlled # Acute metabolic encephalopathy # Demand ischemia  Patient presented with altered mental status found to have glucose in the 400s, this reportedly had been elevated at his care facility prior to admission.  Troponin checked and trended for 438 > 506.  He is without chest pain or other anginal symptoms. Echo this admission with EF 65-70%, no WMAs, mild asymmetric basal-septal LVH . -DKA management as per primary. -Troponin elevation most consistent with demand/supply mismatch and not ACS in the setting of AKI, diabetic ketoacidosis and given no chest pain or anginal symptoms. -No plan for further cardiac diagnostics at this time.  # AKI on CKD stage III Patient with known CKD with baseline creatinine around 1.5 presenting with creatinine of 4.24. Down to 1.49 on AM labs today. -Agree with IV hydration, further management per primary.  # Paroxysmal atrial fibrillation Patient with history of PAF on Eliquis but this has recently been held given intraventricular hemorrhage. -Continue home eliquis 5 mg twice daily. -Not on any rate control medications due to baseline hypotension.  Cardiology will sign off. Please haiku with questions or re-engage if needed.    This patient's plan of care was discussed and created with  Dr. Juliann Pares and he is in agreement.  Signed: Gale Journey, PA-C  03/11/2023, 10:24 AM [facility]

## 2023-03-11 NOTE — Care Management Important Message (Signed)
Important Message  Patient Details  Name: Nicholas Weiss MRN: [id number] Date of Birth: February 08, 1951   Important Message Given:  N/A - LOS <3 / Initial given by admissions     Olegario Messier A Adham Johnson 03/11/2023, 12:22 PM

## 2023-03-11 NOTE — Progress Notes (Signed)
Patient declines to sit in recliner or ambulate today.  Patient also declining meals and only drinking ginger ale and orange juice. Meals trays delivered and patient encouraged to eat and drink to stabalize blood sugars.

## 2023-03-12 DIAGNOSIS — E1010 Type 1 diabetes mellitus with ketoacidosis without coma: Secondary | ICD-10-CM | POA: Diagnosis not present

## 2023-03-12 LAB — BASIC METABOLIC PANEL
Anion gap: 7 (ref 5–15)
BUN: 31 mg/dL — ABNORMAL HIGH (ref 8–23)
CO2: 27 mmol/L (ref 22–32)
Calcium: 8.7 mg/dL — ABNORMAL LOW (ref 8.9–10.3)
Chloride: 105 mmol/L (ref 98–111)
Creatinine, Ser: 1.38 mg/dL — ABNORMAL HIGH (ref 0.61–1.24)
GFR, Estimated: 55 mL/min — ABNORMAL LOW (ref >60)
Glucose, Bld: 270 mg/dL — ABNORMAL HIGH (ref 70–99)
Potassium: 4.6 mmol/L (ref 3.5–5.1)
Sodium: 139 mmol/L (ref 135–145)

## 2023-03-12 LAB — GLUCOSE, CAPILLARY
Glucose-Capillary: 116 mg/dL — ABNORMAL HIGH (ref 70–99)
Glucose-Capillary: 208 mg/dL — ABNORMAL HIGH (ref 70–99)
Glucose-Capillary: 221 mg/dL — ABNORMAL HIGH (ref 70–99)
Glucose-Capillary: 225 mg/dL — ABNORMAL HIGH (ref 70–99)
Glucose-Capillary: 228 mg/dL — ABNORMAL HIGH (ref 70–99)
Glucose-Capillary: 80 mg/dL (ref 70–99)

## 2023-03-12 LAB — PHOSPHORUS: Phosphorus: 2.3 mg/dL — ABNORMAL LOW (ref 2.5–4.6)

## 2023-03-12 LAB — MAGNESIUM: Magnesium: 2.2 mg/dL (ref 1.7–2.4)

## 2023-03-12 MED ORDER — POLYETHYLENE GLYCOL 3350 17 G PO PACK
17.0000 g | PACK | Freq: Every day | ORAL | Status: DC
  Administered 2023-03-12 – 2023-03-18 (×6): 17 g via ORAL
  Filled 2023-03-12 (×7): qty 1

## 2023-03-12 MED ORDER — SENNOSIDES-DOCUSATE SODIUM 8.6-50 MG PO TABS
1.0000 | ORAL_TABLET | Freq: Two times a day (BID) | ORAL | Status: DC
  Administered 2023-03-12 – 2023-03-19 (×11): 1 via ORAL
  Filled 2023-03-12 (×12): qty 1

## 2023-03-12 MED ORDER — MIRTAZAPINE 15 MG PO TABS
7.5000 mg | ORAL_TABLET | Freq: Every day | ORAL | Status: DC
  Administered 2023-03-12 – 2023-03-18 (×7): 7.5 mg via ORAL
  Filled 2023-03-12 (×7): qty 1

## 2023-03-12 NOTE — Progress Notes (Addendum)
Progress Note   Patient: Nicholas Weiss ZHY:[id number] DOB: 12-11-1950 DOA: 03/08/2023     4 DOS: the patient was seen and examined on 03/12/2023   Brief hospital course: "Nicholas Weiss is a 71 y.o. male with medical history significant for hypertension, prior CVA, multiple myeloma, CKD 3B, paroxysmal A-fib, type 1 diabetes mellitus, A-fib on Eliquis, myoclonus on Keppra, dysautonomia/orthostatic hypotension, with recurrent syncope, hospitalized from 11/18 to 02/24/2023 with intraventricular hemorrhage related to a syncopal event who presents by EMS with altered mental status noted when his partner visited him at his facility.  EMS reported that patient had been altered for the past 3 days and his blood glucose readings were high.  Patient was found to have elevated troponin as well as labs showing DKA and subsequently admitted for further management." See H&P for full HPI on admission & ED course.  Further hospital course and management as outlined below.    Assessment and Plan:  Diabetic ketoacidosis without coma associated with type 1 diabetes mellitus (HCC)-resolved DKA has resolved. Continue subcutaneous insulin & titrate regimen for inpatient goal 140-180 Appreciate diabetes coordinator recommendations Reduced Semglee to 8 units at bedtime Continue sliding scale Novolog AC/HS CBG's   Hypoglycemic episodes - improved last episode was AM of 12/12 Hypoglycemia protocol Reduced insulin as above, dc bedtime sliding scale Encourage PO intake including juice    Elevated troponin likely secondary to demand ischemia  Echo this admission with EF 65-70%, no WMAs, mild asymmetric basal-septal LVH  Troponin trended 438-->506 Suspect demand ischemia secondary to DKA as well as elevation from worsening renal function Patient has no chest pain and EKG nonacute Continue on telemetry Heparin drip discontinued by cardiologist as this is unlikely NSTEMI     Acute metabolic  encephalopathy Secondary to DKA CT head nonacute, showing resolution of prior intraventricular hemorrhage Neurologic checks Fall and aspiration precautions    Acute renal failure superimposed on stage 3a chronic kidney disease (HCC) Creatinine over 4.11 up from baseline of 1.5 Expecting improvement with IV fluid resuscitation Monitor renal function and avoid nephrotoxins Renal function improving if this were to worsen to consider nephrologist consultation    History of intraventricular hemorrhage following syncopal event 02/15/2023 Restarted on Eliquis 12/3 CT head showing resolution of prior bleed Resumed home Keppra   Hyperkalemia -- resolved.  K 5.4 on 03/09/23. Monitor BMP   Atrial fibrillation (HCC) Chronic anticoagulation Resumed on Eliquis Not on beta-blocker due to chronic hypotension   Myoclonus Continue Keppra   Dysautonomia orthostatic hypotension with recurrent syncope Most recent syncopal episode 11/18 Continue midodrine Continue fall precautions Up with assistance only Nicholas Weiss/OT    Anemia of chronic kidney failure, stage 3 (moderate) (HCC) Hemoglobin at baseline at 8.2   Essential (primary) hypertension Holding hydralazine as patient also on midodrine for orthostatic hypotension   Multiple myeloma (HCC) No acute disease suspected  Anorexia  Nicholas Weiss's significant other requested to try Nicholas Weiss on appetite stimulant. --Trial low dose Remeron 7.5 mg QHS       DVT prophylaxis: Lovenox   Consults: Cardiology   Advance Care Planning:   Code Status: Prior    Family Communication: none   Disposition Plan: SNF    Subjective:  Nicholas Weiss was awake in bed, keeps eyes closed during encounter. He reports feeling fine. Denies acute complaints.  Not eating much. Breakfast on tray appears untouched.      Physical Exam:  General exam: awake with eyes closed, no acute distress, underweight HEENT: keeps eyes closed, moist mucus membranes,  hearing grossly normal   Respiratory system: CTAB, no wheezes, rales or rhonchi, normal respiratory effort. Cardiovascular system: normal S1/S2, RRR, no pedal edema.   Gastrointestinal system: soft, NT, ND Central nervous system: A&Ox2+, no gross focal deficits, normal speech Extremities: moves all, no edema, normal tone Skin: dry, intact, normal temperature Psychiatry: normal mood, flat affect    Vitals:   03/11/23 2018 03/12/23 0000 03/12/23 0344 03/12/23 0856  BP: (!) 150/97 (!) 152/103 (!) 157/104 (!) 148/96  Pulse: 81  79 84  Resp: 14  17 16   Temp: 98.3 F (36.8 C) 98.4 F (36.9 C) 98 F (36.7 C) 98.1 F (36.7 C)  TempSrc: Oral     SpO2: 100%  100% 100%  Weight:      Height:        Data Reviewed: I have reviewed patient's CT scan of the brain that showed no acute intracranial pathology I reviewed patient's chest x-ray that did not show any acute intrapulmonary pathology    Latest Ref Rng & Units 03/12/2023    6:50 AM 03/11/2023    5:40 AM 03/10/2023    5:09 AM  BMP  Glucose 70 - 99 mg/dL 409  811  96   BUN 8 - 23 mg/dL 31  36  59   Creatinine 0.61 - 1.24 mg/dL 9.14  7.82  9.56   Sodium 135 - 145 mmol/L 139  142  145   Potassium 3.5 - 5.1 mmol/L 4.6  3.8  4.0   Chloride 98 - 111 mmol/L 105  108  117   CO2 22 - 32 mmol/L 27  27  23    Calcium 8.9 - 10.3 mg/dL 8.7  8.3  8.3        Latest Ref Rng & Units 03/11/2023    5:40 AM 03/10/2023    5:09 AM 03/08/2023    9:26 PM  CBC  WBC 4.0 - 10.5 K/uL 2.9  4.7  8.2   Hemoglobin 13.0 - 17.0 g/dL 8.4  7.0  8.2   Hematocrit 39.0 - 52.0 % 26.0  21.5  26.5   Platelets 150 - 400 K/uL 219  225  186      Family Communication: Partner updated at bedside this afternoon.  Disposition: Status is: Inpatient   Time spent: 36 minutes  Author: Pennie Banter, DO 03/12/2023 12:09 PM  For on call review [email or web address].

## 2023-03-12 NOTE — Progress Notes (Signed)
Occupational Therapy Treatment Patient Details Name: Nicholas Weiss MRN: [id number] DOB: 1950-04-03 Today's Date: 03/12/2023   History of present illness Nicholas Weiss is a 72 y.o. male with medical history significant for hypertension, prior CVA, multiple myeloma, CKD 3B, paroxysmal A-fib, type 1 diabetes mellitus, A-fib on Eliquis, myoclonus on Keppra, dysautonomia/orthostatic hypotension, with recurrent syncope, hospitalized from 11/18 to 02/24/2023 with intraventricular hemorrhage related to a syncopal event who presents by EMS with altered mental status noted when his partner visited him at his facility.  EMS reported that patient had been altered for the past 3 days and his blood glucose readings were high.   OT comments  Pt is supine in bed on arrival. Easily arousable and agreeable to OT session initially, but became more agitated/confused as time went on. He did not c/o pain. Girlfriend entered the room and was able to encourage him to sit at [facility] with therapist, however BP drop noted and pt reporting he felt his blood sugar was also low. He had not eaten breakfast or lunch, girlfriend requesting an ensure to feed him. Nurse notified of all findings during session. Mod to Max A to don socks at bed level-more details below. CGA for bed mobility and Min A x2 to scoot to [facility]. Deferred standing d/t BP drop. Noted with R lateral lean and lethargy upon sitting likely d/t BP drop.   Supine BP R arm: 124/91 Sitting BP R arm: 70/58 Return to supine BP R arm: 122/89  Pt returned to bed with all needs in place and will cont to require skilled acute OT services to maximize his safety and IND to return to PLOF.       If plan is discharge home, recommend the following:  Direct supervision/assist for medications management;Direct supervision/assist for financial management;Assist for transportation;Help with stairs or ramp for entrance;Two people to help with walking and/or transfers;A lot of help  with bathing/dressing/bathroom;Assistance with cooking/housework;Supervision due to cognitive status   Equipment Recommendations  Other (comment) (defer)    Recommendations for Other Services      Precautions / Restrictions Precautions Precautions: Fall Precaution Comments: watch orthostatics Restrictions Weight Bearing Restrictions Per Provider Order: No       Mobility Bed Mobility Overal bed mobility: Needs Assistance Bed Mobility: Sit to Supine, Supine to Sit     Supine to sit: Used rails, Contact guard Sit to supine: Contact guard assist, Used rails   General bed mobility comments: CGA for all bed mobility with increased cueing and motivation needed; BP drop once seated therefore returned to supine and reports he thinks his blood sugar is also low-nurse notified    Transfers                   General transfer comment: deferred d/t BP     Balance Overall balance assessment: Needs assistance Sitting-balance support: Feet supported, Bilateral upper extremity supported Sitting balance-Leahy Scale: Poor Sitting balance - Comments: R lateral lean while seated EOB with assist from therapist to return to upright posture at times                                   ADL either performed or assessed with clinical judgement   ADL Overall ADL's : Needs assistance/impaired                     Lower Body Dressing: Moderate assistance;Bed level Lower Body Dressing  Details (indicate cue type and reason): Able to don R sock via bringing knee to chest with Min A to ensure over toes; Max A/total to don L sock after mult attempts he asked for therapist to do it                    Extremity/Trunk Assessment         Cervical / Trunk Assessment Cervical / Trunk Assessment: Normal    Vision       Perception     Praxis      Cognition Arousal: Alert Behavior During Therapy: Agitated Overall Cognitive Status: Impaired/Different from  baseline Area of Impairment: Following commands, Safety/judgement, Awareness, Problem solving, Attention, Memory                 Orientation Level: Disoriented to, Time, Situation, Place Current Attention Level: Focused Memory: Decreased short-term memory Following Commands: Follows one step commands inconsistently, Follows one step commands with increased time Safety/Judgement: Decreased awareness of safety, Decreased awareness of deficits   Problem Solving: Slow processing, Decreased initiation, Requires verbal cues, Requires tactile cues General Comments: patient requires multi modal cues for mobility/safety and increased motivation        Exercises      Shoulder Instructions       General Comments limited by cognition/confusion; BP drop from supine 124/91 to sit 70/58 and pt reporting his blood sugar also low; returned to 122/89 once in supine again    Pertinent Vitals/ Pain       Pain Assessment Pain Assessment: Faces Faces Pain Scale: No hurt Pain Intervention(s): Monitored during session  Home Living                                          Prior Functioning/Environment              Frequency  Min 1X/week        Progress Toward Goals  OT Goals(current goals can now be found in the care plan section)  Progress towards OT goals: Progressing toward goals  Acute Rehab OT Goals Patient Stated Goal: improve strength OT Goal Formulation: With patient Time For Goal Achievement: 03/24/23 Potential to Achieve Goals: Good  Plan      Co-evaluation                 AM-PAC OT "6 Clicks" Daily Activity     Outcome Measure   Help from another person eating meals?: A Little Help from another person taking care of personal grooming?: A Little Help from another person toileting, which includes using toliet, bedpan, or urinal?: A Lot Help from another person bathing (including washing, rinsing, drying)?: A Lot Help from another  person to put on and taking off regular upper body clothing?: A Little Help from another person to put on and taking off regular lower body clothing?: A Lot 6 Click Score: 15    End of Session    OT Visit Diagnosis: Other abnormalities of gait and mobility (R26.89);Repeated falls (R29.6);Unsteadiness on feet (R26.81)   Activity Tolerance Patient limited by fatigue   Patient Left in bed;with call bell/phone within reach;with bed alarm set;with nursing/sitter in room;with family/visitor present   Nurse Communication Mobility status        Time: 1201-1228 OT Time Calculation (min): 27 min  Charges: OT General Charges $OT Visit: 1 Visit OT Treatments $Therapeutic  Activity: 23-37 mins  Zacharey Jensen, OTR/L  03/12/23, 1:42 PM   Aadon Gorelik E Hannan Hutmacher 03/12/2023, 1:39 PM

## 2023-03-12 NOTE — Plan of Care (Signed)
  Problem: Nutritional: Goal: Maintenance of adequate nutrition will improve Outcome: Progressing Goal: Progress toward achieving an optimal weight will improve Outcome: Progressing   Problem: Education: Goal: Ability to describe self-care measures that may prevent or decrease complications (Diabetes Survival Skills Education) will improve Outcome: Progressing   Problem: Nutritional: Goal: Maintenance of adequate nutrition will improve Outcome: Progressing Goal: Maintenance of adequate weight for body size and type will improve Outcome: Progressing

## 2023-03-12 NOTE — Inpatient Diabetes Management (Addendum)
Inpatient Diabetes Program Recommendations  AACE/ADA: New Consensus Statement on Inpatient Glycemic Control (2015)  Target Ranges:  Prepandial:   less than 140 mg/dL      Peak postprandial:   less than 180 mg/dL (1-2 hours)      Critically ill patients:  140 - 180 mg/dL   Lab Results  Component Value Date   GLUCAP 225 (H) 03/12/2023   HGBA1C 9.0 (H) 11/14/2022    Review of Glycemic Control  Latest Reference Range & Units 03/11/23 16:36 03/11/23 20:15 03/12/23 00:52 03/12/23 05:22 03/12/23 09:00  Glucose-Capillary 70 - 99 mg/dL 540 (H) 981 (H) 191 (H) 208 (H) 225 (H)  (H): Data is abnormally high  Diabetes history: DM1 Outpatient Diabetes medications:  Tresiba 5 units every day Humalog 0-9 units TID Current orders for Inpatient glycemic control:  Semglee 15 units BID Novolog 0-9 units TID and 0-5 units QHS  Inpatient Diabetes Program Recommendations:    Might consider increasing basal:  Semglee 10 units at bedtime Novolog 0-6 TID   Will continue to follow while inpatient.  Thank you, Dulce Sellar, MSN, CDCES Diabetes Coordinator Inpatient Diabetes Program [phone] (team pager from 8a-5p)

## 2023-03-13 DIAGNOSIS — E1010 Type 1 diabetes mellitus with ketoacidosis without coma: Secondary | ICD-10-CM | POA: Diagnosis not present

## 2023-03-13 LAB — BASIC METABOLIC PANEL WITH GFR
Anion gap: 6 (ref 5–15)
BUN: 37 mg/dL — ABNORMAL HIGH (ref 8–23)
CO2: 29 mmol/L (ref 22–32)
Calcium: 8.6 mg/dL — ABNORMAL LOW (ref 8.9–10.3)
Chloride: 110 mmol/L (ref 98–111)
Creatinine, Ser: 1.56 mg/dL — ABNORMAL HIGH (ref 0.61–1.24)
GFR, Estimated: 48 mL/min — ABNORMAL LOW
Glucose, Bld: 105 mg/dL — ABNORMAL HIGH (ref 70–99)
Potassium: 4.1 mmol/L (ref 3.5–5.1)
Sodium: 145 mmol/L (ref 135–145)

## 2023-03-13 LAB — PHOSPHORUS: Phosphorus: 2.4 mg/dL — ABNORMAL LOW (ref 2.5–4.6)

## 2023-03-13 LAB — GLUCOSE, CAPILLARY
Glucose-Capillary: 53 mg/dL — ABNORMAL LOW (ref 70–99)
Glucose-Capillary: 111 mg/dL — ABNORMAL HIGH (ref 70–99)
Glucose-Capillary: 235 mg/dL — ABNORMAL HIGH (ref 70–99)
Glucose-Capillary: 129 mg/dL — ABNORMAL HIGH (ref 70–99)
Glucose-Capillary: 60 mg/dL — ABNORMAL LOW (ref 70–99)
Glucose-Capillary: 110 mg/dL — ABNORMAL HIGH (ref 70–99)

## 2023-03-13 NOTE — Progress Notes (Signed)
Progress Note   Patient: Nicholas Weiss QIH:[id number] DOB: Jan 25, 1951 DOA: 03/08/2023     5 DOS: the patient was seen and examined on 03/13/2023   Brief hospital course: "Nicholas Weiss is a 72 y.o. male with medical history significant for hypertension, prior CVA, multiple myeloma, CKD 3B, paroxysmal A-fib, type 1 diabetes mellitus, A-fib on Eliquis, myoclonus on Keppra, dysautonomia/orthostatic hypotension, with recurrent syncope, hospitalized from 11/18 to 02/24/2023 with intraventricular hemorrhage related to a syncopal event who presents by EMS with altered mental status noted when his partner visited him at his facility.  EMS reported that patient had been altered for the past 3 days and his blood glucose readings were high.  Patient was found to have elevated troponin as well as labs showing DKA and subsequently admitted for further management." See H&P for full HPI on admission & ED course.  Further hospital course and management as outlined below.    Assessment and Plan:  Diabetic ketoacidosis without coma associated with type 1 diabetes mellitus (HCC)-resolved DKA has resolved. Continue subcutaneous insulin & titrate regimen for inpatient goal 140-180 Appreciate diabetes coordinator recommendations Reduced Semglee to 8 units at bedtime Continue sliding scale Novolog AC/HS CBG's   Hypoglycemic episodes - improved. But recurrent with CBG 53 this AM (12/14), improved to 111 after given OJ Hypoglycemia protocol Reduced insulin as above, dc bedtime sliding scale Encourage PO intake including juice    Elevated troponin likely secondary to demand ischemia  Echo this admission with EF 65-70%, no WMAs, mild asymmetric basal-septal LVH  Troponin trended 438-->506 Suspect demand ischemia secondary to DKA as well as elevation from worsening renal function Patient has no chest pain and EKG nonacute Continue on telemetry Heparin drip discontinued by cardiologist as this is unlikely  NSTEMI     Acute metabolic encephalopathy Secondary to DKA CT head nonacute, showing resolution of prior intraventricular hemorrhage Neurologic checks Fall and aspiration precautions    Acute renal failure superimposed on stage 3a chronic kidney disease (HCC) Creatinine over 4.11 up from baseline of 1.5 Expecting improvement with IV fluid resuscitation Monitor renal function and avoid nephrotoxins Renal function improving if this were to worsen to consider nephrologist consultation    History of intraventricular hemorrhage following syncopal event 02/15/2023 Restarted on Eliquis 12/3 CT head showing resolution of prior bleed Resumed home Keppra   Hyperkalemia -- resolved.  K 5.4 on 03/09/23. Monitor BMP   Atrial fibrillation (HCC) Chronic anticoagulation Resumed on Eliquis Not on beta-blocker due to chronic hypotension   Myoclonus Continue Keppra   Dysautonomia orthostatic hypotension with recurrent syncope Most recent syncopal episode 11/18 Continue midodrine Continue fall precautions Up with assistance only PT/OT    Anemia of chronic kidney failure, stage 3 (moderate) (HCC) Hemoglobin at baseline at 8.2 Stable Monitor CBC's periodically   Essential (primary) hypertension Holding hydralazine as patient also on midodrine for orthostatic hypotension   Multiple myeloma (HCC) No acute disease suspected  Anorexia  Pt's significant other requested to try pt on appetite stimulant. --Trial low dose Remeron 7.5 mg QHS       DVT prophylaxis: Lovenox   Consults: Cardiology   Advance Care Planning:   Code Status: Prior    Family Communication: at bedside on rounds today   Disposition Plan: SNF    Subjective:  Pt was resting with blankets over his head, but awake and agreed to uncover for exam.  He denies any acute complaints or issues.      Physical Exam:  General exam: awake  with eyes closed, no acute distress, underweight HEENT: keeps eyes closed,  moist mucus membranes, hearing grossly normal  Respiratory system: lungs clear, no wheezes or rhonchi, normal respiratory effort. Cardiovascular system: normal S1/S2, RRR, no pedal edema.   Gastrointestinal system: soft, NT, ND Central nervous system: A&Ox2+, no gross focal deficits, normal speech Extremities: moves all, no edema, normal tone Skin: dry, intact, normal temperature Psychiatry: normal mood, flat affect    Vitals:   03/13/23 0110 03/13/23 0407 03/13/23 0812 03/13/23 1124  BP: (!) 159/102 127/72 (!) 145/98 (!) 146/92  Pulse: 82 80 78 85  Resp: 20 20 16 18   Temp: 98.3 F (36.8 C) (!) 97 F (36.1 C) 97.8 F (36.6 C) 97.9 F (36.6 C)  TempSrc:      SpO2: 100% 100% 100% 100%  Weight:      Height:        Data Reviewed: I have reviewed patient's CT scan of the brain that showed no acute intracranial pathology I reviewed patient's chest x-ray that did not show any acute intrapulmonary pathology    Latest Ref Rng & Units 03/13/2023    5:19 AM 03/12/2023    6:50 AM 03/11/2023    5:40 AM  BMP  Glucose 70 - 99 mg/dL 244  010  272   BUN 8 - 23 mg/dL 37  31  36   Creatinine 0.61 - 1.24 mg/dL 5.36  6.44  0.34   Sodium 135 - 145 mmol/L 145  139  142   Potassium 3.5 - 5.1 mmol/L 4.1  4.6  3.8   Chloride 98 - 111 mmol/L 110  105  108   CO2 22 - 32 mmol/L 29  27  27    Calcium 8.9 - 10.3 mg/dL 8.6  8.7  8.3        Latest Ref Rng & Units 03/11/2023    5:40 AM 03/10/2023    5:09 AM 03/08/2023    9:26 PM  CBC  WBC 4.0 - 10.5 K/uL 2.9  4.7  8.2   Hemoglobin 13.0 - 17.0 g/dL 8.4  7.0  8.2   Hematocrit 39.0 - 52.0 % 26.0  21.5  26.5   Platelets 150 - 400 K/uL 219  225  186      Family Communication: Partner updated at bedside on rounds.  Disposition: Status is: Inpatient   Time spent: 36 minutes  Author: Pennie Banter, DO 03/13/2023 12:30 PM  For on call review [email or web address].

## 2023-03-13 NOTE — Progress Notes (Signed)
Hypoglycemic Event  CBG: 60  Treatment: 4 OZ OJ, [place] Ensure  Symptoms: None  Follow-up CBG: Time:2035 CBG Result:60  Possible Reasons for Event: Poor intake  Comments/MD notified: Cliffton Asters NP    Lenor Derrick

## 2023-03-13 NOTE — Progress Notes (Signed)
Patient continues to remove tele and throws to the floor. Writer has been in every 5 to 10 minutes replacing tele. Provider B. Jon Billings NP notified of non compliance.

## 2023-03-13 NOTE — Plan of Care (Signed)
  Problem: Safety: Goal: Ability to remain free from injury will improve Outcome: Progressing   Problem: Skin Integrity: Goal: Risk for impaired skin integrity will decrease Outcome: Progressing   

## 2023-03-14 DIAGNOSIS — E1010 Type 1 diabetes mellitus with ketoacidosis without coma: Secondary | ICD-10-CM | POA: Diagnosis not present

## 2023-03-14 LAB — GLUCOSE, CAPILLARY
Glucose-Capillary: 143 mg/dL — ABNORMAL HIGH (ref 70–99)
Glucose-Capillary: 146 mg/dL — ABNORMAL HIGH (ref 70–99)
Glucose-Capillary: 205 mg/dL — ABNORMAL HIGH (ref 70–99)
Glucose-Capillary: 284 mg/dL — ABNORMAL HIGH (ref 70–99)
Glucose-Capillary: 294 mg/dL — ABNORMAL HIGH (ref 70–99)
Glucose-Capillary: 296 mg/dL — ABNORMAL HIGH (ref 70–99)
Glucose-Capillary: 70 mg/dL (ref 70–99)
Glucose-Capillary: 87 mg/dL (ref 70–99)

## 2023-03-14 MED ORDER — GABAPENTIN 300 MG PO CAPS
300.0000 mg | ORAL_CAPSULE | Freq: Two times a day (BID) | ORAL | Status: DC
  Administered 2023-03-14 – 2023-03-19 (×11): 300 mg via ORAL
  Filled 2023-03-14 (×11): qty 1

## 2023-03-14 MED ORDER — ADULT MULTIVITAMIN W/MINERALS CH
1.0000 | ORAL_TABLET | Freq: Every day | ORAL | Status: DC
Start: 2023-03-14 — End: 2023-03-19
  Administered 2023-03-14 – 2023-03-19 (×6): 1 via ORAL
  Filled 2023-03-14 (×6): qty 1

## 2023-03-14 MED ORDER — MIDODRINE HCL 5 MG PO TABS
10.0000 mg | ORAL_TABLET | Freq: Three times a day (TID) | ORAL | Status: DC
  Administered 2023-03-14 – 2023-03-18 (×11): 10 mg via ORAL
  Filled 2023-03-14 (×13): qty 2

## 2023-03-14 MED ORDER — VITAMIN D (ERGOCALCIFEROL) 1.25 MG (50000 UNIT) PO CAPS
50000.0000 [IU] | ORAL_CAPSULE | ORAL | Status: DC
  Administered 2023-03-14: 50000 [IU] via ORAL
  Filled 2023-03-14: qty 1

## 2023-03-14 MED ORDER — QUETIAPINE FUMARATE 25 MG PO TABS
25.0000 mg | ORAL_TABLET | Freq: Every evening | ORAL | Status: DC
Start: 2023-03-14 — End: 2023-03-17
  Administered 2023-03-14 – 2023-03-16 (×3): 25 mg via ORAL
  Filled 2023-03-14 (×3): qty 1

## 2023-03-14 MED ORDER — TRAZODONE HCL 50 MG PO TABS: 25.0000 mg | ORAL_TABLET | Freq: Every evening | ORAL | Status: DC | PRN

## 2023-03-14 MED ORDER — ATORVASTATIN CALCIUM 80 MG PO TABS
80.0000 mg | ORAL_TABLET | Freq: Every day | ORAL | Status: DC
  Administered 2023-03-14 – 2023-03-18 (×5): 80 mg via ORAL
  Filled 2023-03-14 (×5): qty 1

## 2023-03-14 MED ORDER — VITAMIN C 500 MG PO TABS
500.0000 mg | ORAL_TABLET | Freq: Every day | ORAL | Status: DC
  Administered 2023-03-14 – 2023-03-19 (×6): 500 mg via ORAL
  Filled 2023-03-14 (×6): qty 1

## 2023-03-14 NOTE — Plan of Care (Signed)
  Problem: Nutritional: Goal: Progress toward achieving an optimal weight will improve Outcome: Progressing   Problem: Skin Integrity: Goal: Risk for impaired skin integrity will decrease Outcome: Progressing   Problem: Tissue Perfusion: Goal: Adequacy of tissue perfusion will improve Outcome: Progressing

## 2023-03-14 NOTE — Plan of Care (Signed)
  Problem: Metabolic: Goal: Ability to maintain appropriate glucose levels will improve Outcome: Progressing   Problem: Nutritional: Goal: Maintenance of adequate nutrition will improve Outcome: Progressing   Problem: Activity: Goal: Ability to tolerate increased activity will improve Outcome: Progressing   Problem: Nutritional: Goal: Maintenance of adequate nutrition will improve Outcome: Progressing

## 2023-03-14 NOTE — TOC Progression Note (Addendum)
Transition of Care [facility]) - Progression Note    Patient Details  Name: Nicholas Weiss MRN: [id number] Date of Birth: 29-Oct-1950  [facility] of [facility]) CM/SW Contact  Bing Quarry, RN Phone Number: 03/14/2023, 10:19 AM  Clinical Narrative:  12/15: Spoke with  Jenel Lucks at [facility] as RN CM was unsure of the plan and unable to reach friend, Flint Melter of [facility] listed as contact/DPR, but not as HCPOA or Legal Guardian. Voice mail was full. [name] indicated he was at [facility] and will need new insurance authorization. No new referrals seen sent out to [facility] in the electronic HUB per friend, [name]'s request, in last CM note. Patient is only documented as oriented to person in nursing flowsheet assessment this am and there is a 1:1 safety sitter noted as of 12/15 1006 am until 12/15 1006 pm. Patient would have to be without sitter for 48 hours prior to returning to STR/SNF. Barrier to discharge: 1:1 Recruitment consultant in place.   Gabriel Cirri MSN RN CM  Care Management Department.    [facility] Direct Dial: [phone] Main Office Phone: [phone] Weekends Only           Expected Discharge Plan and Services                                               Social Determinants of Health (SDOH) Interventions SDOH Screenings   Food Insecurity: Patient Unable To Answer (03/09/2023)  Housing: Patient Unable To Answer (03/09/2023)  Transportation Needs: Patient Unable To Answer (03/09/2023)  Utilities: Patient Unable To Answer (03/09/2023)  Alcohol Screen: Low Risk  (10/02/2020)  Depression (PHQ2-9): Medium Risk (10/02/2020)  Financial Resource Strain: Low Risk  (01/26/2023)   Received from [facility]  Physical Activity: Insufficiently Active (01/01/2020)  Social Connections: Moderately Isolated (01/01/2020)  Stress: No Stress Concern Present (01/01/2020)  Tobacco Use: Low Risk  (03/08/2023)  Recent Concern: Tobacco Use - Medium  Risk (02/15/2023)   Received from [facility]    Readmission Risk Interventions    02/16/2023    3:56 PM 08/24/2022   10:57 AM 05/20/2022   11:41 AM  Readmission Risk Prevention Plan  Transportation Screening Complete Complete Complete  Medication Review Oceanographer) Complete Complete Complete  PCP or Specialist appointment within 3-5 days of discharge Complete Complete Complete  HRI or Home Care Consult Complete Complete Complete  SW Recovery Care/Counseling Consult Not Complete    Palliative Care Screening Not Applicable  Complete  Skilled Nursing Facility Complete Complete Complete

## 2023-03-14 NOTE — Progress Notes (Signed)
Patient gets anxious at times. Patient pulled out IV. Cath intact. Lines changed. Continues to remove tele box Orange juice provided. Lines changed. Tele put back on.

## 2023-03-14 NOTE — Progress Notes (Signed)
Progress Note   Patient: Nicholas Weiss:[id number] DOB: Mar 10, 1951 DOA: 03/08/2023     6 DOS: the patient was seen and examined on 03/14/2023   Brief hospital course: "Nicholas Weiss is a 72 y.o. male with medical history significant for hypertension, prior CVA, multiple myeloma, CKD 3B, paroxysmal A-fib, type 1 diabetes mellitus, A-fib on Eliquis, myoclonus on Keppra, dysautonomia/orthostatic hypotension, with recurrent syncope, hospitalized from 11/18 to 02/24/2023 with intraventricular hemorrhage related to a syncopal event who presents by EMS with altered mental status noted when his partner visited him at his facility.  EMS reported that patient had been altered for the past 3 days and his blood glucose readings were high.  Patient was found to have elevated troponin as well as labs showing DKA and subsequently admitted for further management." See H&P for full HPI on admission & ED course.  Further hospital course and management as outlined below.    Assessment and Plan:  Diabetic ketoacidosis without coma associated with type 1 diabetes mellitus (HCC)-resolved DKA has resolved. Continue subcutaneous insulin & titrate regimen for inpatient goal 140-180 Appreciate diabetes coordinator recommendations Reduced Semglee to 8 units at bedtime Continue sliding scale Novolog AC/HS CBG's   Hypoglycemic episodes - improved. Still having lows in evening, but less severe (0's to 60's) Hypoglycemia protocol Reduced insulin as above, dc bedtime sliding scale Encourage PO intake including juice    Elevated troponin likely secondary to demand ischemia  Echo this admission with EF 65-70%, no WMAs, mild asymmetric basal-septal LVH  Troponin trended 438-->506 Suspect demand ischemia secondary to DKA as well as elevation from worsening renal function Patient has no chest pain and EKG nonacute Continue on telemetry Heparin drip discontinued by cardiologist as this is unlikely NSTEMI      Acute metabolic encephalopathy Secondary to DKA CT head nonacute, showing resolution of prior intraventricular hemorrhage Neurologic checks Fall and aspiration precautions    Acute renal failure superimposed on stage 3a chronic kidney disease (HCC) Creatinine over 4.11 up from baseline of 1.5 Expecting improvement with IV fluid resuscitation Monitor renal function and avoid nephrotoxins Renal function improving if this were to worsen to consider nephrologist consultation    History of intraventricular hemorrhage following syncopal event 02/15/2023 Restarted on Eliquis 12/3 CT head showing resolution of prior bleed Resumed home Keppra   Hyperkalemia -- resolved.  K 5.4 on 03/09/23. Monitor BMP   Atrial fibrillation (HCC) Chronic anticoagulation Resumed on Eliquis Not on beta-blocker due to chronic hypotension   Myoclonus Continue Keppra   Dysautonomia orthostatic hypotension with recurrent syncope Most recent syncopal episode 11/18 Continue midodrine Continue fall precautions Up with assistance only PT/OT    Anemia of chronic kidney failure, stage 3 (moderate) (HCC) Hemoglobin at baseline at 8.2 Stable Monitor CBC's periodically   Essential (primary) hypertension Holding hydralazine  On midodrine for orthostatic hypotension   Multiple myeloma (HCC) No acute disease suspected  Anorexia  Pt's significant other requested to try pt on appetite stimulant. --Trial low dose Remeron 7.5 mg at bedtime started, monitor for efficacy or side effects       DVT prophylaxis: Lovenox   Consults: Cardiology   Advance Care Planning:   Code Status: Prior    Family Communication: at bedside on rounds today   Disposition Plan: SNF    Subjective:  Pt was resting with blankets over his head, male family member visiting.  Pt reports feeling well, denies any complaints.  Ate some grits for breakfast.      Physical  Exam:  General exam: awake, alert, no acute  distress HEENT: keeps head covered with blanket, hearing grossly normal  Respiratory system: on room air, normal respiratory effort. Cardiovascular system: RRR, no pedal edema.   Gastrointestinal system: soft, NT, ND Central nervous system: A&O x3. no gross focal neurologic deficits, normal speech Extremities: moves all, no edema, normal tone Psychiatry: limited exam as pt keeps head covered     Vitals:   03/13/23 2030 03/14/23 0002 03/14/23 0752 03/14/23 1158  BP: (!) 98/56 101/63 136/81 124/78  Pulse: 84 85 78 80  Resp: 16 18    Temp: 98.2 F (36.8 C) 98 F (36.7 C) 97.6 F (36.4 C) (!) 96.7 F (35.9 C)  TempSrc: Oral Oral    SpO2: 100%  100% 100%  Weight:      Height:        Data Reviewed: I have reviewed patient's CT scan of the brain that showed no acute intracranial pathology I reviewed patient's chest x-ray that did not show any acute intrapulmonary pathology    Latest Ref Rng & Units 03/13/2023    5:19 AM 03/12/2023    6:50 AM 03/11/2023    5:40 AM  BMP  Glucose 70 - 99 mg/dL 956  213  086   BUN 8 - 23 mg/dL 37  31  36   Creatinine 0.61 - 1.24 mg/dL 5.78  4.69  6.29   Sodium 135 - 145 mmol/L 145  139  142   Potassium 3.5 - 5.1 mmol/L 4.1  4.6  3.8   Chloride 98 - 111 mmol/L 110  105  108   CO2 22 - 32 mmol/L 29  27  27    Calcium 8.9 - 10.3 mg/dL 8.6  8.7  8.3        Latest Ref Rng & Units 03/11/2023    5:40 AM 03/10/2023    5:09 AM 03/08/2023    9:26 PM  CBC  WBC 4.0 - 10.5 K/uL 2.9  4.7  8.2   Hemoglobin 13.0 - 17.0 g/dL 8.4  7.0  8.2   Hematocrit 39.0 - 52.0 % 26.0  21.5  26.5   Platelets 150 - 400 K/uL 219  225  186      Family Communication: Male family member at bedside on rounds this AM.  Disposition: Status is: Inpatient   Time spent: 36 minutes  Author: Pennie Banter, DO 03/14/2023 12:23 PM  For on call review [email or web address].

## 2023-03-15 ENCOUNTER — Ambulatory Visit: Payer: 59

## 2023-03-15 ENCOUNTER — Other Ambulatory Visit: Payer: 59

## 2023-03-15 DIAGNOSIS — E1010 Type 1 diabetes mellitus with ketoacidosis without coma: Secondary | ICD-10-CM | POA: Diagnosis not present

## 2023-03-15 LAB — CBC
HCT: 27.1 % — ABNORMAL LOW (ref 39.0–52.0)
Hemoglobin: 8.7 g/dL — ABNORMAL LOW (ref 13.0–17.0)
MCH: 31 pg (ref 26.0–34.0)
MCHC: 32.1 g/dL (ref 30.0–36.0)
MCV: 96.4 fL (ref 80.0–100.0)
Platelets: 268 10*3/uL (ref 150–400)
RBC: 2.81 MIL/uL — ABNORMAL LOW (ref 4.22–5.81)
RDW: 15.4 % (ref 11.5–15.5)
WBC: 3.7 10*3/uL — ABNORMAL LOW (ref 4.0–10.5)
nRBC: 0 % (ref 0.0–0.2)

## 2023-03-15 LAB — BASIC METABOLIC PANEL
Anion gap: 6 (ref 5–15)
Anion gap: 9 (ref 5–15)
BUN: 50 mg/dL — ABNORMAL HIGH (ref 8–23)
BUN: 49 mg/dL — ABNORMAL HIGH (ref 8–23)
CO2: 29 mmol/L (ref 22–32)
CO2: 27 mmol/L (ref 22–32)
Calcium: 8.5 mg/dL — ABNORMAL LOW (ref 8.9–10.3)
Calcium: 8.4 mg/dL — ABNORMAL LOW (ref 8.9–10.3)
Chloride: 102 mmol/L (ref 98–111)
Chloride: 100 mmol/L (ref 98–111)
Creatinine, Ser: 2.06 mg/dL — ABNORMAL HIGH (ref 0.61–1.24)
Creatinine, Ser: 2.07 mg/dL — ABNORMAL HIGH (ref 0.61–1.24)
GFR, Estimated: 34 mL/min — ABNORMAL LOW (ref >60)
GFR, Estimated: 34 mL/min — ABNORMAL LOW (ref >60)
Glucose, Bld: 290 mg/dL — ABNORMAL HIGH (ref 70–99)
Glucose, Bld: 286 mg/dL — ABNORMAL HIGH (ref 70–99)
Potassium: 5.3 mmol/L — ABNORMAL HIGH (ref 3.5–5.1)
Potassium: 5.3 mmol/L — ABNORMAL HIGH (ref 3.5–5.1)
Sodium: 137 mmol/L (ref 135–145)
Sodium: 136 mmol/L (ref 135–145)

## 2023-03-15 LAB — IRON AND TIBC
Iron: 39 ug/dL — ABNORMAL LOW (ref 45–182)
Saturation Ratios: 19 % (ref 17.9–39.5)
TIBC: 206 ug/dL — ABNORMAL LOW (ref 250–450)
UIBC: 167 ug/dL

## 2023-03-15 LAB — GLUCOSE, CAPILLARY
Glucose-Capillary: 324 mg/dL — ABNORMAL HIGH (ref 70–99)
Glucose-Capillary: 255 mg/dL — ABNORMAL HIGH (ref 70–99)
Glucose-Capillary: 93 mg/dL (ref 70–99)
Glucose-Capillary: 122 mg/dL — ABNORMAL HIGH (ref 70–99)
Glucose-Capillary: 179 mg/dL — ABNORMAL HIGH (ref 70–99)
Glucose-Capillary: 133 mg/dL — ABNORMAL HIGH (ref 70–99)

## 2023-03-15 LAB — MAGNESIUM: Magnesium: 2.3 mg/dL (ref 1.7–2.4)

## 2023-03-15 LAB — PHOSPHORUS: Phosphorus: 2.6 mg/dL (ref 2.5–4.6)

## 2023-03-15 MED ORDER — POLYSACCHARIDE IRON COMPLEX 150 MG PO CAPS
150.0000 mg | ORAL_CAPSULE | Freq: Every day | ORAL | Status: DC
Start: 2023-03-15 — End: 2023-03-19
  Administered 2023-03-15 – 2023-03-19 (×5): 150 mg via ORAL
  Filled 2023-03-15 (×5): qty 1

## 2023-03-15 MED ORDER — SODIUM ZIRCONIUM CYCLOSILICATE 10 G PO PACK
10.0000 g | PACK | Freq: Three times a day (TID) | ORAL | Status: AC
  Administered 2023-03-15 (×2): 10 g via ORAL
  Filled 2023-03-15 (×2): qty 1

## 2023-03-15 MED ORDER — SODIUM CHLORIDE 0.9 % IV SOLN: INTRAVENOUS | Status: DC

## 2023-03-15 NOTE — Progress Notes (Signed)
Progress Note   Patient: Nicholas Weiss ZHY:[id number] DOB: [birth year] DOA: 03/08/2023     7 DOS: the patient was seen and examined on 03/15/2023   Brief hospital course: "Nicholas Weiss is a 72 y.o. male with medical history significant for hypertension, prior CVA, multiple myeloma, CKD 3B, paroxysmal A-fib, type 1 diabetes mellitus, A-fib on Eliquis, myoclonus on Keppra, dysautonomia/orthostatic hypotension, with recurrent syncope, hospitalized from 11/18 to 02/24/2023 with intraventricular hemorrhage related to a syncopal event who presents by EMS with altered mental status noted when his partner visited him at his facility.  EMS reported that patient had been altered for the past 3 days and his blood glucose readings were high.  Patient was found to have elevated troponin as well as labs showing DKA and subsequently admitted for further management." See H&P for full HPI on admission & ED course.  Further hospital course and management as outlined below.    Assessment and Plan:  Diabetic ketoacidosis without coma associated with type 1 diabetes mellitus (HCC)-resolved DKA has resolved. Continue subcutaneous insulin & titrate regimen for inpatient goal 140-180 Appreciate diabetes coordinator recommendations Reduced Semglee to 8 units at bedtime Continue sliding scale Novolog AC/HS CBG's   Hypoglycemic episodes - improved. Still having lows in evening, but less severe (0's to 60's) Hypoglycemia protocol Reduced insulin as above, dc bedtime sliding scale Encourage PO intake including juice    Elevated troponin likely secondary to demand ischemia  Echo this admission with EF 65-70%, no WMAs, mild asymmetric basal-septal LVH  Troponin trended 438-->506 Suspect demand ischemia secondary to DKA as well as elevation from worsening renal function Patient has no chest pain and EKG nonacute Continue on telemetry Heparin drip discontinued by cardiologist as this is unlikely NSTEMI      Acute metabolic encephalopathy Secondary to DKA CT head nonacute, showing resolution of prior intraventricular hemorrhage Neurologic checks Fall and aspiration precautions    Acute renal failure superimposed on stage 3a chronic kidney disease (HCC) Creatinine over 4.11 up from baseline of 1.5 Expecting improvement with IV fluid resuscitation Monitor renal function and avoid nephrotoxins Nephro consult if no improvement in creatinine 12/16 Creatinine 2.06 elevated, started IV fluid for hydration     History of intraventricular hemorrhage following syncopal event 02/15/2023 Restarted on Eliquis 12/3 CT head showing resolution of prior bleed Resumed home Keppra   Hyperkalemia -- K 5.3 on 03/15/23. 12/16 Lokelma 10 g 3 times daily x 2 doses Continue low potassium diet Monitor BMP   Atrial fibrillation (HCC) Chronic anticoagulation Resumed on Eliquis Not on beta-blocker due to chronic hypotension   Myoclonus Continue Keppra   Dysautonomia orthostatic hypotension with recurrent syncope Most recent syncopal episode 11/18 Continue midodrine Continue fall precautions Up with assistance only PT/OT    Anemia of chronic kidney failure, stage 3 (moderate) (HCC) Hemoglobin at baseline at 8.2 Stable Monitor CBC's periodically   Essential (primary) hypertension Holding hydralazine  On midodrine for orthostatic hypotension   Multiple myeloma (HCC) No acute disease suspected  Anorexia  Pt's significant other requested to try pt on appetite stimulant. --Trial low dose Remeron 7.5 mg at bedtime started, monitor for efficacy or side effects   Anemia, mild iron deficiency, iron level 39, transferrin saturation 19% Started oral iron supplement.   DVT prophylaxis: Lovenox   Consults: Cardiology   Advance Care Planning:   Code Status: Full code   Family Communication: at bedside on rounds today   Disposition Plan: SNF    Subjective: No significant events overnight,  patient was sleepy, he woke up by calling his name.  Patient was not in a good mood after waking up.  Stated that what do you need, I am doing fine, denied any complaints. Patient was left alone as he was irritable and wanted to sleep.  Physical Exam:  General exam: NAD, resting comfortably HEENT: keeps head covered with blanket, hearing grossly normal  Respiratory system: on room air, normal respiratory effort. Cardiovascular system: RRR, no pedal edema.   Gastrointestinal system: soft, NT, ND Central nervous system: A&O x3. no gross focal neurologic deficits, normal speech Extremities: moves all, no edema, normal tone Psychiatry: limited exam as pt keeps head covered     Vitals:   03/14/23 2316 03/15/23 0310 03/15/23 0747 03/15/23 1222  BP: (!) 158/82 (!) 154/107 133/82 109/71  Pulse: 73 71 71 69  Resp:  18    Temp: 97.6 F (36.4 C) 97.8 F (36.6 C)  97.7 F (36.5 C)  TempSrc: Oral Oral    SpO2: 100% 100% 100% 99%  Weight:      Height:        Data Reviewed: I have reviewed patient's CT scan of the brain that showed no acute intracranial pathology I reviewed patient's chest x-ray that did not show any acute intrapulmonary pathology    Latest Ref Rng & Units 03/15/2023    6:10 AM 03/13/2023    5:19 AM 03/12/2023    6:50 AM  BMP  Glucose 70 - 99 mg/dL 70 - 99 mg/dL 161    096  045  409   BUN 8 - 23 mg/dL 8 - 23 mg/dL 50    49  37  31   Creatinine 0.61 - 1.24 mg/dL 8.11 - 9.14 mg/dL 7.82    9.56  2.13  0.86   Sodium 135 - 145 mmol/L 135 - 145 mmol/L 137    136  145  139   Potassium 3.5 - 5.1 mmol/L 3.5 - 5.1 mmol/L 5.3    5.3  4.1  4.6   Chloride 98 - 111 mmol/L 98 - 111 mmol/L 102    100  110  105   CO2 22 - 32 mmol/L 22 - 32 mmol/L 29    27  29  27    Calcium 8.9 - 10.3 mg/dL 8.9 - 57.8 mg/dL 8.5    8.4  8.6  8.7        Latest Ref Rng & Units 03/15/2023    6:10 AM 03/11/2023    5:40 AM 03/10/2023    5:09 AM  CBC  WBC 4.0 - 10.5 K/uL 3.7  2.9  4.7    Hemoglobin 13.0 - 17.0 g/dL 8.7  8.4  7.0   Hematocrit 39.0 - 52.0 % 27.1  26.0  21.5   Platelets 150 - 400 K/uL 268  219  225      Family Communication: Male family member at bedside on rounds this AM.  Disposition: Status is: Inpatient   Time spent: 40 minutes  Author: Gillis Santa, MD 03/15/2023 3:39 PM  For on call review [email or web address].

## 2023-03-15 NOTE — Plan of Care (Signed)

## 2023-03-15 NOTE — Inpatient Diabetes Management (Signed)
Inpatient Diabetes Program Recommendations  AACE/ADA: New Consensus Statement on Inpatient Glycemic Control   Target Ranges:  Prepandial:   less than 140 mg/dL      Peak postprandial:   less than 180 mg/dL (1-2 hours)      Critically ill patients:  140 - 180 mg/dL    Latest Reference Range & Units 03/14/23 07:54 03/14/23 12:00 03/14/23 16:08 03/14/23 17:53 03/14/23 20:00 03/14/23 23:52 03/15/23 03:11 03/15/23 07:48  Glucose-Capillary 70 - 99 mg/dL 546 (H) 270 (H) 70 350 (H) 296 (H) 294 (H) 324 (H) 255 (H)   Review of Glycemic Control  Diabetes history: DM1 Outpatient Diabetes medications: Tresiba 5 units daily, Novolog 0-9 units TID with meals Current orders for Inpatient glycemic control: Semglee 8 units daily, Novolog 0-9 units TID with meals,  Inpatient Diabetes Program Recommendations:     Insulin: Please consider decreasing Novolog correction to 0-6 units TID and ordering Novolog 2 units TID with meals for meal coverage if patient eats at least 50% of meals.   Thanks, Orlando Penner, RN, MSN, CDCES Diabetes Coordinator Inpatient Diabetes Program [phone] (Team Pager from 8am to 5pm)

## 2023-03-15 NOTE — TOC Progression Note (Signed)
Transition of Care [facility]) - Progression Note    Patient Details  Name: Nicholas Weiss MRN: [id number] Date of Birth: 06/09/1950  [facility] CM/SW Contact  Truddie Hidden, RN Phone Number: 03/15/2023, 1:58 PM  Clinical Narrative:    Per [name], Admissions Director at [facility]. Patient has no NOK other than his Significant Other , Nicholas Weiss.  Patient can return to [facility] per [name] after sitter free for 48 hours.         Expected Discharge Plan and Services                                               Social Determinants of Health (SDOH) Interventions SDOH Screenings   Food Insecurity: Patient Unable To Answer (03/09/2023)  Housing: Patient Unable To Answer (03/09/2023)  Transportation Needs: Patient Unable To Answer (03/09/2023)  Utilities: Patient Unable To Answer (03/09/2023)  Alcohol Screen: Low Risk  (10/02/2020)  Depression (PHQ2-9): Medium Risk (10/02/2020)  Financial Resource Strain: Low Risk  (01/26/2023)   Received from [facility]  Physical Activity: Insufficiently Active (01/01/2020)  Social Connections: Moderately Isolated (01/01/2020)  Stress: No Stress Concern Present (01/01/2020)  Tobacco Use: Low Risk  (03/08/2023)  Recent Concern: Tobacco Use - Medium Risk (02/15/2023)   Received from [facility]    Readmission Risk Interventions    02/16/2023    3:56 PM 08/24/2022   10:57 AM 05/20/2022   11:41 AM  Readmission Risk Prevention Plan  Transportation Screening Complete Complete Complete  Medication Review Oceanographer) Complete Complete Complete  PCP or Specialist appointment within 3-5 days of discharge Complete Complete Complete  HRI or Home Care Consult Complete Complete Complete  SW Recovery Care/Counseling Consult Not Complete    Palliative Care Screening Not Applicable  Complete  Skilled Nursing Facility Complete Complete Complete

## 2023-03-15 NOTE — Progress Notes (Addendum)
Physical Therapy Treatment Patient Details Name: Nicholas Weiss MRN: [id number] DOB: 1951-03-18 Today's Date: 03/15/2023   History of Present Illness Nicholas Weiss is a 72 y.o. male with medical history significant for hypertension, prior CVA, multiple myeloma, CKD 3B, paroxysmal A-fib, type 1 diabetes mellitus, A-fib on Eliquis, myoclonus on Keppra, dysautonomia/orthostatic hypotension, with recurrent syncope, hospitalized from 11/18 to 02/24/2023 with intraventricular hemorrhage related to a syncopal event who presents by EMS with altered mental status noted when his partner visited him at his facility.  EMS reported that patient had been altered for the past 3 days and his blood glucose readings were high.    PT Comments  Pt very lethargic/obtunded at onset of session but did gradually improved during the session and by the end of the session pt was fairly alert and communicating more verbally.  Pt required heavy assist for bed mobility tasks and had difficulty maintaining static sitting balance at the EOB, transfer attempt deferred.  Pt reported no adverse symptoms with SpO2 and HR WNL on room air.   Pt will benefit from continued PT services upon discharge to safely address deficits listed in patient problem list for decreased caregiver assistance and eventual return to PLOF. Addendum: Pt's K found to be at 5.3 with MD contacted prior to session and giving OK for patient to participate with PT services.      If plan is discharge home, recommend the following: Two people to help with walking and/or transfers;A lot of help with bathing/dressing/bathroom;Assistance with feeding;Assist for transportation   Can travel by private vehicle     No  Equipment Recommendations  None recommended by PT    Recommendations for Other Services       Precautions / Restrictions Precautions Precautions: Fall Precaution Comments: watch orthostatics Required Braces or Orthoses: Other Brace Other  Brace: Abdominal binder, B TED hose Restrictions Weight Bearing Restrictions Per Provider Order: No     Mobility  Bed Mobility Overal bed mobility: Needs Assistance Bed Mobility: Sit to Supine, Supine to Sit     Supine to sit: Max assist, +2 for physical assistance Sit to supine: Max assist, +2 for physical assistance   General bed mobility comments: Near total assist for BLE and trunk control    Transfers                   General transfer comment: deferred due to lethargy and instability in sitting    Ambulation/Gait                   Stairs             Wheelchair Mobility     Tilt Bed    Modified Rankin (Stroke Patients Only)       Balance Overall balance assessment: Needs assistance Sitting-balance support: Feet supported, Bilateral upper extremity supported Sitting balance-Leahy Scale: Poor Sitting balance - Comments: Frequent min A to prevent LOB in random directions                                    Cognition Arousal: Lethargic Behavior During Therapy: Flat affect Overall Cognitive Status: No family/caregiver present to determine baseline cognitive functioning  Exercises Total Joint Exercises Straight Leg Raises: AAROM, Strengthening, Both, 5 reps Other Exercises Other Exercises: Static sitting at EOB x 5 min for improved activity tolerance and core strength    General Comments        Pertinent Vitals/Pain Pain Assessment Pain Assessment: No/denies pain    Home Living                          Prior Function            PT Goals (current goals can now be found in the care plan section) Progress towards PT goals: Not progressing toward goals - comment (limited by lethargy)    Frequency    Min 1X/week      PT Plan      Co-evaluation              AM-PAC PT "6 Clicks" Mobility   Outcome Measure  Help needed turning  from your back to your side while in a flat bed without using bedrails?: A Lot Help needed moving from lying on your back to sitting on the side of a flat bed without using bedrails?: A Lot Help needed moving to and from a bed to a chair (including a wheelchair)?: A Lot Help needed standing up from a chair using your arms (e.g., wheelchair or bedside chair)?: A Lot Help needed to walk in hospital room?: Total Help needed climbing 3-5 steps with a railing? : Total 6 Click Score: 10    End of Session Equipment Utilized During Treatment: Gait belt Activity Tolerance: Patient limited by lethargy Patient left: in bed;with call bell/phone within reach;with bed alarm set Nurse Communication: Mobility status PT Visit Diagnosis: Unsteadiness on feet (R26.81);Muscle weakness (generalized) (M62.81);Other abnormalities of gait and mobility (R26.89)     Time: 8295-6213 PT Time Calculation (min) (ACUTE ONLY): 24 min  Charges:    $Therapeutic Activity: 23-37 mins PT General Charges $$ ACUTE PT VISIT: 1 Visit                    D. Elly Modena PT, DPT 03/15/23, 2:47 PM

## 2023-03-16 DIAGNOSIS — E1010 Type 1 diabetes mellitus with ketoacidosis without coma: Secondary | ICD-10-CM | POA: Diagnosis not present

## 2023-03-16 LAB — CBC
HCT: 24.8 % — ABNORMAL LOW (ref 39.0–52.0)
Hemoglobin: 7.9 g/dL — ABNORMAL LOW (ref 13.0–17.0)
MCH: 31 pg (ref 26.0–34.0)
MCHC: 31.9 g/dL (ref 30.0–36.0)
MCV: 97.3 fL (ref 80.0–100.0)
Platelets: 244 10*3/uL (ref 150–400)
RBC: 2.55 MIL/uL — ABNORMAL LOW (ref 4.22–5.81)
RDW: 15.8 % — ABNORMAL HIGH (ref 11.5–15.5)
WBC: 4 10*3/uL (ref 4.0–10.5)
nRBC: 0 % (ref 0.0–0.2)

## 2023-03-16 LAB — GLUCOSE, CAPILLARY
Glucose-Capillary: 115 mg/dL — ABNORMAL HIGH (ref 70–99)
Glucose-Capillary: 138 mg/dL — ABNORMAL HIGH (ref 70–99)
Glucose-Capillary: 232 mg/dL — ABNORMAL HIGH (ref 70–99)
Glucose-Capillary: 294 mg/dL — ABNORMAL HIGH (ref 70–99)
Glucose-Capillary: 45 mg/dL — ABNORMAL LOW (ref 70–99)
Glucose-Capillary: 58 mg/dL — ABNORMAL LOW (ref 70–99)
Glucose-Capillary: 84 mg/dL (ref 70–99)
Glucose-Capillary: 91 mg/dL (ref 70–99)

## 2023-03-16 LAB — BASIC METABOLIC PANEL
Anion gap: 5 (ref 5–15)
BUN: 49 mg/dL — ABNORMAL HIGH (ref 8–23)
CO2: 27 mmol/L (ref 22–32)
Calcium: 8.2 mg/dL — ABNORMAL LOW (ref 8.9–10.3)
Chloride: 104 mmol/L (ref 98–111)
Creatinine, Ser: 2.14 mg/dL — ABNORMAL HIGH (ref 0.61–1.24)
GFR, Estimated: 33 mL/min — ABNORMAL LOW (ref >60)
Glucose, Bld: 112 mg/dL — ABNORMAL HIGH (ref 70–99)
Potassium: 4.7 mmol/L (ref 3.5–5.1)
Sodium: 136 mmol/L (ref 135–145)

## 2023-03-16 LAB — MAGNESIUM: Magnesium: 2.3 mg/dL (ref 1.7–2.4)

## 2023-03-16 LAB — PHOSPHORUS: Phosphorus: 3.7 mg/dL (ref 2.5–4.6)

## 2023-03-16 MED ORDER — INSULIN GLARGINE-YFGN 100 UNIT/ML ~~LOC~~ SOLN
6.0000 [IU] | Freq: Every day | SUBCUTANEOUS | Status: DC
  Administered 2023-03-17: 6 [IU] via SUBCUTANEOUS
  Filled 2023-03-16 (×2): qty 0.06

## 2023-03-16 MED ORDER — INSULIN ASPART 100 UNIT/ML IJ SOLN
0.0000 [IU] | Freq: Three times a day (TID) | INTRAMUSCULAR | Status: DC
  Administered 2023-03-16: 3 [IU] via SUBCUTANEOUS
  Administered 2023-03-16: 2 [IU] via SUBCUTANEOUS
  Administered 2023-03-17: 6 [IU] via SUBCUTANEOUS
  Administered 2023-03-17 – 2023-03-18 (×2): 3 [IU] via SUBCUTANEOUS
  Administered 2023-03-18: 2 [IU] via SUBCUTANEOUS
  Filled 2023-03-16 (×6): qty 1

## 2023-03-16 MED ORDER — SODIUM CHLORIDE 0.9 % IV SOLN
INTRAVENOUS | Status: DC
Start: 2023-03-16 — End: 2023-03-17

## 2023-03-16 NOTE — TOC Progression Note (Addendum)
Transition of Care [facility]) - Progression Note    Patient Details  Name: Nicholas Weiss MRN: [id number] Date of Birth: 10-22-1950  Transition of Care [facility]) CM/SW Contact  Truddie Hidden, RN Phone Number: 03/16/2023, 1:49 PM  Clinical Narrative:    FL2 completed. Bed search for [facility] started.         Expected Discharge Plan and Services                                               Social Determinants of Health (SDOH) Interventions SDOH Screenings   Food Insecurity: Patient Unable To Answer (03/09/2023)  Housing: Patient Unable To Answer (03/09/2023)  Transportation Needs: Patient Unable To Answer (03/09/2023)  Utilities: Patient Unable To Answer (03/09/2023)  Alcohol Screen: Low Risk  (10/02/2020)  Depression (PHQ2-9): Medium Risk (10/02/2020)  Financial Resource Strain: Low Risk  (01/26/2023)   Received from [facility]  Physical Activity: Insufficiently Active (01/01/2020)  Social Connections: Moderately Isolated (01/01/2020)  Stress: No Stress Concern Present (01/01/2020)  Tobacco Use: Low Risk  (03/08/2023)  Recent Concern: Tobacco Use - Medium Risk (02/15/2023)   Received from [facility]    Readmission Risk Interventions    02/16/2023    3:56 PM 08/24/2022   10:57 AM 05/20/2022   11:41 AM  Readmission Risk Prevention Plan  Transportation Screening Complete Complete Complete  Medication Review Oceanographer) Complete Complete Complete  PCP or Specialist appointment within 3-5 days of discharge Complete Complete Complete  HRI or Home Care Consult Complete Complete Complete  SW Recovery Care/Counseling Consult Not Complete    Palliative Care Screening Not Applicable  Complete  Skilled Nursing Facility Complete Complete Complete

## 2023-03-16 NOTE — Inpatient Diabetes Management (Signed)
Inpatient Diabetes Program Recommendations  AACE/ADA: New Consensus Statement on Inpatient Glycemic Control   Target Ranges:  Prepandial:   less than 140 mg/dL      Peak postprandial:   less than 180 mg/dL (1-2 hours)      Critically ill patients:  140 - 180 mg/dL    Latest Reference Range & Units 03/15/23 07:48 03/15/23 12:24 03/15/23 16:39 03/15/23 19:28 03/15/23 23:06 03/16/23 04:06 03/16/23 04:41 03/16/23 05:09  Glucose-Capillary 70 - 99 mg/dL 401 (H) 93 027 (H) 253 (H) 133 (H) 45 (L) 58 (L) 115 (H)   Review of Glycemic Control  Diabetes history: DM1 Outpatient Diabetes medications: Tresiba 5 units daily, Novolog 0-9 units TID with meals Current orders for Inpatient glycemic control: Semglee 8 units daily, Novolog 0-9 units TID with meals,   Inpatient Diabetes Program Recommendations:     Insulin: CBG 45 mg/dl at 6:64 am today. Please consider decreasing Semglee to 6 units daily and Novolog correction to 0-6 units TID.  Thanks, Orlando Penner, RN, MSN, CDCES Diabetes Coordinator Inpatient Diabetes Program [phone] (Team Pager from 8am to 5pm)

## 2023-03-16 NOTE — Progress Notes (Signed)
Progress Note   Patient: Nicholas Weiss QVZ:[id number] DOB: 03-17-1951 DOA: 03/08/2023     8 DOS: the patient was seen and examined on 03/16/2023   Brief hospital course: "Nicholas Weiss is a 72 y.o. male with medical history significant for hypertension, prior CVA, multiple myeloma, CKD 3B, paroxysmal A-fib, type 1 diabetes mellitus, A-fib on Eliquis, myoclonus on Keppra, dysautonomia/orthostatic hypotension, with recurrent syncope, hospitalized from 11/18 to 02/24/2023 with intraventricular hemorrhage related to a syncopal event who presents by EMS with altered mental status noted when his partner visited him at his facility.  EMS reported that patient had been altered for the past 3 days and his blood glucose readings were high.  Patient was found to have elevated troponin as well as labs showing DKA and subsequently admitted for further management." See H&P for full HPI on admission & ED course.  Further hospital course and management as outlined below.   Assessment and Plan:  Diabetic ketoacidosis without coma associated with type 1 diabetes mellitus (HCC)-resolved DKA has resolved. Continue subcutaneous insulin & titrate regimen for inpatient goal 140-180 Appreciate diabetes coordinator recommendations 12/17 Reduced Semglee to 6 units at bedtime 12/17 reduced sliding scale insulin 0 to 6 units 3 times daily Monitor CBG's, continue diabetic diet   Hypoglycemic episodes - improved. Still having lows in evening, but less severe (0's to 60's) Hypoglycemia protocol Reduced insulin as above, dc bedtime sliding scale Encourage PO intake including juice 12/17 early morning hypoglycemia most likely due to decreased p.o. intake.  Insulin dose decreased.   Elevated troponin likely secondary to demand ischemia  Echo this admission with EF 65-70%, no WMAs, mild asymmetric basal-septal LVH  Troponin trended 438-->506 Suspect demand ischemia secondary to DKA as well as elevation from  worsening renal function Patient has no chest pain and EKG nonacute Continue on telemetry Heparin drip discontinued by cardiologist as this is unlikely NSTEMI     Acute metabolic encephalopathy Secondary to DKA CT head nonacute, showing resolution of prior intraventricular hemorrhage Neurologic checks Fall and aspiration precautions    Acute renal failure superimposed on stage 3a chronic kidney disease Creatinine over 4.11 up from baseline of 1.5 Expecting improvement with IV fluid resuscitation Monitor renal function and avoid nephrotoxins Nephro consult if no improvement in creatinine 12/17 Creatinine 2.14 elevated, continue IV fluid for hydration     History of intraventricular hemorrhage following syncopal event 02/15/2023 Restarted on Eliquis 12/3 CT head showing resolution of prior bleed Resumed home Keppra   Hyperkalemia -- K 5.3 on 03/15/23. 12/16 Lokelma 10 g 3 times daily x 2 doses Continue low potassium diet Monitor BMP   Atrial fibrillation (HCC) Chronic anticoagulation Resumed on Eliquis Not on beta-blocker due to chronic hypotension   Myoclonus Continue Keppra   Dysautonomia orthostatic hypotension with recurrent syncope Most recent syncopal episode 11/18 Continue midodrine Continue fall precautions Up with assistance only PT/OT    Anemia of chronic kidney failure, stage 3 (moderate) (HCC) Hemoglobin at baseline at 8.2 Stable Monitor CBC's periodically   Essential (primary) hypertension Holding hydralazine  On midodrine for orthostatic hypotension   Multiple myeloma (HCC) No acute disease suspected  Anorexia  Pt's significant other requested to try pt on appetite stimulant. --Trial low dose Remeron 7.5 mg at bedtime started, monitor for efficacy or side effects   Anemia, mild iron deficiency, iron level 39, transferrin saturation 19% Started oral iron supplement.   DVT prophylaxis: Lovenox   Consults: Cardiology   Advance Care  Planning:   Code  Status: Full code   Family Communication: at bedside on rounds today   Disposition Plan: SNF    Subjective: No significant events overnight, patient was awake today, stated that he is feeling fine, no problems.  Denied any complaints.  Physical Exam:  General exam: NAD, resting comfortably HEENT: Oral mucosa moist, no abnormal findings Respiratory system: on room air, normal respiratory effort. Cardiovascular system: RRR, no pedal edema.   Gastrointestinal system: soft, NT, ND Central nervous system: no gross focal neurologic deficits, normal speech Extremities: moves all, no edema, normal tone Psychiatry: Affect depressed and sad expressions     Vitals:   03/16/23 0405 03/16/23 0839 03/16/23 1217 03/16/23 1617  BP: 138/76 (!) 135/92 (!) 143/80 (!) 169/86  Pulse: 65 64 63 71  Resp: 18 17 17 17   Temp: 98.2 F (36.8 C) 97.7 F (36.5 C)  98.1 F (36.7 C)  TempSrc:      SpO2: 100% 100% 100% 100%  Weight:      Height:        Data Reviewed: I have reviewed patient's CT scan of the brain that showed no acute intracranial pathology I reviewed patient's chest x-ray that did not show any acute intrapulmonary pathology    Latest Ref Rng & Units 03/16/2023    7:05 AM 03/15/2023    6:10 AM 03/13/2023    5:19 AM  BMP  Glucose 70 - 99 mg/dL 960  454    098  119   BUN 8 - 23 mg/dL 49  50    49  37   Creatinine 0.61 - 1.24 mg/dL 1.47  8.29    5.62  1.30   Sodium 135 - 145 mmol/L 136  137    136  145   Potassium 3.5 - 5.1 mmol/L 4.7  5.3    5.3  4.1   Chloride 98 - 111 mmol/L 104  102    100  110   CO2 22 - 32 mmol/L 27  29    27  29    Calcium 8.9 - 10.3 mg/dL 8.2  8.5    8.4  8.6        Latest Ref Rng & Units 03/16/2023    7:05 AM 03/15/2023    6:10 AM 03/11/2023    5:40 AM  CBC  WBC 4.0 - 10.5 K/uL 4.0  3.7  2.9   Hemoglobin 13.0 - 17.0 g/dL 7.9  8.7  8.4   Hematocrit 39.0 - 52.0 % 24.8  27.1  26.0   Platelets 150 - 400 K/uL 244  268  219       Family Communication: Nobody was present at bedside during a.m. rounds   Time spent: 40 minutes  Author: Gillis Santa, MD 03/16/2023 5:04 PM  For on call review [email or web address].

## 2023-03-16 NOTE — [place] FL2 (Signed)
[place] MEDICAID FL2 LEVEL OF CARE FORM     IDENTIFICATION  Patient Name: Nicholas Weiss Birthdate: 1951-02-27 Sex: male Admission Date (Current Location): 03/08/2023  [place] Number:  Chiropodist and Address:  [facility], [address], [place], [place] [address]      Provider Number: [id number]  Attending Physician Name and Address:  Gillis Santa, MD  Relative Name and Phone Number:  Flint Melter [phone]    Current Level of Care: Hospital Recommended Level of Care: Skilled Nursing Facility Prior Approval Number:    Date Approved/Denied:   PASRR Number: [id number]  Discharge Plan: SNF    Current Diagnoses: Patient Active Problem List   Diagnosis Date Noted   NSTEMI (non-ST elevated myocardial infarction) (HCC) 03/09/2023   Elevated troponin 03/08/2023   Atrial fibrillation (HCC) 03/08/2023   Chronic anticoagulation 03/08/2023   ICH (intracerebral hemorrhage) (HCC) 02/16/2023   Uncontrolled diabetes mellitus with hyperglycemia (HCC) 02/16/2023   GERD without esophagitis 02/16/2023   Essential hypertension 02/16/2023   Diabetic neuropathy (HCC) 02/16/2023   Intraventricular hemorrhage (HCC) 02/16/2023   Myoclonus 01/15/2023   Hypothermia 12/29/2022   Dizziness 11/13/2022   Substance abuse (HCC) 09/18/2022   History of GI bleed 09/18/2022   History of intraventricular hemorrhage following syncopal event 02/15/2023 09/18/2022   Uncontrolled type 1 diabetes mellitus with hyperglycemia, with long-term current use of insulin (HCC) 08/26/2022   Postural dizziness with presyncope 08/18/2022   Near syncope 08/18/2022   Closed fracture of shaft of left humerus 06/18/2022   Malnutrition of moderate degree 06/10/2022   Hypotension 06/10/2022   GI bleed 06/06/2022   Subdural hematoma (HCC) 05/15/2022   Falls 05/15/2022   Shoulder pain, left 05/15/2022   History of amputation of left great toe (HCC) 05/04/2022    Hypophosphatemia 04/28/2022   Protein-calorie malnutrition, severe 04/24/2022   Diabetic ulcer of toe associated with diabetes mellitus due to underlying condition, with fat layer exposed (HCC) 04/24/2022   Streptococcal bacteremia 04/24/2022   Leukopenia 04/24/2022   History of multiple myeloma 04/24/2022   Dysautonomia orthostatic hypotension with recurrent syncope 04/24/2022   Weight loss 04/23/2022   Osteomyelitis of left foot (HCC) 04/22/2022   DKA, type 1 (HCC) 04/22/2022   Dehydration 04/08/2022   Diabetic ketoacidosis without coma associated with type 1 diabetes mellitus (HCC) 04/07/2022   Hyperkalemia 04/07/2022   Traumatic pneumothorax 02/18/2022   Multiple fractures of ribs, left side, initial encounter for closed fracture 02/18/2022   Pneumothorax, closed, traumatic 02/18/2022   Benign prostatic hyperplasia with nocturia 02/12/2022   Stroke (cerebrum) (HCC) 11/07/2021   Multiple myeloma (HCC) 09/29/2021   Multiple myeloma not having achieved remission (HCC) 09/29/2021   Hyponatremia 08/18/2021   COVID-19 virus infection 08/13/2021   History of CVA (cerebrovascular accident) 08/13/2021   Syncope and collapse, recurrent 08/09/2021   Hepatic lesion 08/05/2021   Dyslipidemia 05/08/2021   History of substance abuse (HCC) 12/09/2020   Acute metabolic encephalopathy 12/02/2020   Cocaine abuse (HCC) 11/30/2020   Symptomatic anemia 11/08/2020   Muscle twitching 09/12/2020   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   Pre-syncope 08/01/2020   Heart block AV first degree 08/01/2020   Anemia of chronic kidney failure, stage 3 (moderate) (HCC) 05/28/2020   PUD (peptic ulcer disease)    Esophageal dysphagia    Encounter for screening colonoscopy    Hyperglycemia 07/28/2019   Generalized weakness 07/28/2019   Hypoglycemia 04/27/2019   Unresponsiveness 04/27/2019   Lung nodule 04/07/2019  Type 1 diabetes mellitus (HCC)    Acute renal failure superimposed on stage 3a chronic  kidney disease (HCC)    Hypertensive urgency 04/05/2019   AKI (acute kidney injury) (HCC) 04/05/2019   CKD (chronic kidney disease) stage 3, GFR 30-59 ml/min (HCC) 04/05/2019   Hypercholesterolemia 01/24/2016   Essential (primary) hypertension 06/07/2006   Type 1 diabetes mellitus with diabetic neuropathy, unspecified (HCC) 05/31/2006    Orientation RESPIRATION BLADDER Height & Weight     Self, Place  Normal Incontinent Weight: 62.6 kg Height:  5\' 10"  (177.8 cm)  BEHAVIORAL SYMPTOMS/MOOD NEUROLOGICAL BOWEL NUTRITION STATUS  Other (Comment) (n/a)   Continent Diet (Carb Modified)  AMBULATORY STATUS COMMUNICATION OF NEEDS Skin   Limited Assist Verbally Normal                       Personal Care Assistance Level of Assistance  Bathing, Dressing Bathing Assistance: Limited assistance   Dressing Assistance: Limited assistance     Functional Limitations Info  Sight Sight Info: Impaired        SPECIAL CARE FACTORS FREQUENCY  PT (By licensed PT), OT (By licensed OT)     PT Frequency: Min 2x weekly OT Frequency: Min 2x weekly            Contractures Contractures Info: Not present    Additional Factors Info  Code Status, Allergies Code Status Info: FULL Allergies Info: Penicillins           Current Medications (03/16/2023):  This is the current hospital active medication list Current Facility-Administered Medications  Medication Dose Route Frequency Provider Last Rate Last Admin   0.9 %  sodium chloride infusion   Intravenous Continuous Gillis Santa, MD 75 mL/hr at 03/16/23 1058 New Bag at 03/16/23 1058   acetaminophen (TYLENOL) tablet 650 mg  650 mg Oral Q4H PRN Andris Baumann, MD   650 mg at 03/16/23 0925   apixaban (ELIQUIS) tablet 5 mg  5 mg Oral BID Hudson, Caralyn, PA-C   5 mg at 03/16/23 4098   ascorbic acid (VITAMIN C) tablet 500 mg  500 mg Oral Daily Esaw Grandchild A, DO   500 mg at 03/16/23 1191   aspirin EC tablet 81 mg  81 mg Oral Daily Andris Baumann, MD   81 mg at 03/16/23 0816   atorvastatin (LIPITOR) tablet 80 mg  80 mg Oral QHS Esaw Grandchild A, DO   80 mg at 03/15/23 2107   dextrose 50 % solution 0-50 mL  0-50 mL Intravenous PRN Chesley Noon, MD   50 mL at 03/10/23 2124   gabapentin (NEURONTIN) capsule 300 mg  300 mg Oral BID Esaw Grandchild A, DO   300 mg at 03/16/23 0816   influenza vaccine adjuvanted (FLUAD) injection 0.5 mL  0.5 mL Intramuscular Tomorrow-1000 Rosezetta Schlatter T, MD       insulin aspart (novoLOG) injection 0-6 Units  0-6 Units Subcutaneous TID WC Gillis Santa, MD   2 Units at 03/16/23 1249   insulin glargine-yfgn (SEMGLEE) injection 6 Units  6 Units Subcutaneous QHS Gillis Santa, MD       iron polysaccharides (NIFEREX) capsule 150 mg  150 mg Oral Daily Gillis Santa, MD   150 mg at 03/16/23 0815   levETIRAcetam (KEPPRA) tablet 500 mg  500 mg Oral BID Esaw Grandchild A, DO   500 mg at 03/16/23 0816   midodrine (PROAMATINE) tablet 10 mg  10 mg Oral TID WC Pennie Banter, DO  10 mg at 03/16/23 1101   mirtazapine (REMERON) tablet 7.5 mg  7.5 mg Oral QHS Esaw Grandchild A, DO   7.5 mg at 03/15/23 2106   multivitamin with minerals tablet 1 tablet  1 tablet Oral Daily Esaw Grandchild A, DO   1 tablet at 03/16/23 0815   nitroGLYCERIN (NITROSTAT) SL tablet 0.4 mg  0.4 mg Sublingual Q5 Min x 3 PRN Andris Baumann, MD       ondansetron [facility]) injection 4 mg  4 mg Intravenous Q6H PRN Andris Baumann, MD       Oral care mouth rinse  15 mL Mouth Rinse PRN Loyce Dys, MD       polyethylene glycol (MIRALAX / GLYCOLAX) packet 17 g  17 g Oral Daily Esaw Grandchild A, DO   17 g at 03/16/23 0815   QUEtiapine (SEROQUEL) tablet 25 mg  25 mg Oral QPM Esaw Grandchild A, DO   25 mg at 03/15/23 1728   senna-docusate (Senokot-S) tablet 1 tablet  1 tablet Oral BID Esaw Grandchild A, DO   1 tablet at 03/16/23 0816   traZODone (DESYREL) tablet 25 mg  25 mg Oral QHS PRN Pennie Banter, DO       Vitamin D (Ergocalciferol)  (DRISDOL) 1.25 MG (50000 UNIT) capsule 50,000 Units  50,000 Units Oral Q7 days Pennie Banter, DO   50,000 Units at 03/14/23 1256     Discharge Medications: Please see discharge summary for a list of discharge medications.  Relevant Imaging Results:  Relevant Lab Results:   Additional Information SS# [id number]  Truddie Hidden, RN

## 2023-03-16 NOTE — Progress Notes (Signed)
Hypoglycemic Event  CBG:45 at 0406  Treatment: 4 oz apple juice  Symptoms: none  Follow-up CBG: Time:0441 CBG Result:58  Treatment: 4 oz apple juice  Follow-up CBG: Time:0509                         CBG Result:115                Possible Reasons for Event: poor intake, insulin regimen  Comments/MD notified: Geradine Girt, NP    Anntoinette Haefele Michel Bickers

## 2023-03-17 DIAGNOSIS — Z7189 Other specified counseling: Secondary | ICD-10-CM | POA: Diagnosis not present

## 2023-03-17 DIAGNOSIS — E1010 Type 1 diabetes mellitus with ketoacidosis without coma: Secondary | ICD-10-CM | POA: Diagnosis not present

## 2023-03-17 LAB — GLUCOSE, CAPILLARY
Glucose-Capillary: 102 mg/dL — ABNORMAL HIGH (ref 70–99)
Glucose-Capillary: 109 mg/dL — ABNORMAL HIGH (ref 70–99)
Glucose-Capillary: 135 mg/dL — ABNORMAL HIGH (ref 70–99)
Glucose-Capillary: 290 mg/dL — ABNORMAL HIGH (ref 70–99)
Glucose-Capillary: 33 mg/dL — CL (ref 70–99)
Glucose-Capillary: 408 mg/dL — ABNORMAL HIGH (ref 70–99)
Glucose-Capillary: 420 mg/dL — ABNORMAL HIGH (ref 70–99)

## 2023-03-17 MED ORDER — INSULIN ASPART 100 UNIT/ML IJ SOLN
10.0000 [IU] | Freq: Once | INTRAMUSCULAR | Status: AC
  Administered 2023-03-17: 10 [IU] via SUBCUTANEOUS
  Filled 2023-03-17: qty 1

## 2023-03-17 MED ORDER — SODIUM CHLORIDE 0.9 % IV SOLN: INTRAVENOUS | Status: DC

## 2023-03-17 MED ORDER — DRONABINOL 2.5 MG PO CAPS
5.0000 mg | ORAL_CAPSULE | Freq: Two times a day (BID) | ORAL | Status: DC
  Administered 2023-03-17 – 2023-03-19 (×4): 5 mg via ORAL
  Filled 2023-03-17 (×4): qty 2

## 2023-03-17 NOTE — Progress Notes (Signed)
Progress Note   Patient: Nicholas Weiss WUJ:[id number] DOB: July 14, 1950 DOA: 03/08/2023     9 DOS: the patient was seen and examined on 03/17/2023   Brief hospital course: "Nicholas Weiss is a 72 y.o. male with medical history significant for hypertension, prior CVA, multiple myeloma, CKD 3B, paroxysmal A-fib, type 1 diabetes mellitus, A-fib on Eliquis, myoclonus on Keppra, dysautonomia/orthostatic hypotension, with recurrent syncope, hospitalized from 11/18 to 02/24/2023 with intraventricular hemorrhage related to a syncopal event who presents by EMS with altered mental status noted when his partner visited him at his facility.  EMS reported that patient had been altered for the past 3 days and his blood glucose readings were high.  Patient was found to have elevated troponin as well as labs showing DKA and subsequently admitted for further management." See H&P for full HPI on admission & ED course.  Further hospital course and management as outlined below.   Assessment and Plan:  Diabetic ketoacidosis without coma associated with type 1 diabetes mellitus (HCC)-resolved DKA has resolved. Continue subcutaneous insulin & titrate regimen for inpatient goal 140-180 Appreciate diabetes coordinator recommendations 12/17 Reduced Semglee to 6 units at bedtime 12/17 reduced sliding scale insulin 0 to 6 units 3 times daily Monitor CBG's, continue diabetic diet   Hypoglycemic episodes - improved. Still having lows in evening, but less severe (0's to 60's) Hypoglycemia protocol Reduced insulin as above, dc bedtime sliding scale Encourage PO intake including juice 12/17 early morning hypoglycemia most likely due to decreased p.o. intake.  Insulin dose decreased.   Elevated troponin likely secondary to demand ischemia  Echo this admission with EF 65-70%, no WMAs, mild asymmetric basal-septal LVH  Troponin trended 438-->506 Suspect demand ischemia secondary to DKA as well as elevation from  worsening renal function Patient has no chest pain and EKG nonacute Continue on telemetry Heparin drip discontinued by cardiologist as this is unlikely NSTEMI     Acute metabolic encephalopathy Secondary to DKA CT head nonacute, showing resolution of prior intraventricular hemorrhage Neurologic checks Fall and aspiration precautions    Acute renal failure superimposed on stage 3a chronic kidney disease Creatinine over 4.11 up from baseline of 1.5 Expecting improvement with IV fluid resuscitation Monitor renal function and avoid nephrotoxins Nephro consult if no improvement in creatinine 12/17 Creatinine 2.14 elevated, s/p IV fluid for hydration 12/18 lost IV access, discontinued IVF, recommended continue oral hydration.    History of intraventricular hemorrhage following syncopal event 02/15/2023 Restarted on Eliquis 12/3 CT head showing resolution of prior bleed Resumed home Keppra   Hyperkalemia -- K 5.3 on 03/15/23. 12/16 Lokelma 10 g 3 times daily x 2 doses Continue low potassium diet Monitor BMP   Atrial fibrillation (HCC) Chronic anticoagulation Resumed on Eliquis Not on beta-blocker due to chronic hypotension   Myoclonus Continue Keppra   Dysautonomia orthostatic hypotension with recurrent syncope Most recent syncopal episode 11/18 Continue midodrine Continue fall precautions Up with assistance only PT/OT    Anemia of chronic kidney failure, stage 3 (moderate) (HCC) Hemoglobin at baseline at 8.2 Stable Monitor CBC's periodically   Essential (primary) hypertension Holding hydralazine  On midodrine for orthostatic hypotension   Multiple myelom: No acute disease suspected  Anorexia  Pt's significant other requested to try pt on appetite stimulant. --Trial low dose Remeron 7.5 mg at bedtime started, monitor for efficacy or side effects 12/18 started Marinol 5 mg p.o. twice daily due to persistent low appetite  Anemia, mild iron deficiency, iron  level 39, transferrin saturation 19% Started  oral iron supplement.   DVT prophylaxis: Lovenox   Consults: Cardiology   Advance Care Planning:   Code Status: Full code   Family Communication: at bedside on rounds today   Disposition Plan: SNF    Subjective: No significant events overnight, patient was laying comfortably, patient's partner was at bedside, management plan discussed.  She stated that patient does not want Seroquel and still has low appetite, would like him to be prescribed any medication which can increase appetite.   Physical Exam: General exam: NAD, resting comfortably HEENT: Oral mucosa moist, no abnormal findings Respiratory system: on room air, normal respiratory effort. Cardiovascular system: RRR, no pedal edema.   Gastrointestinal system: soft, NT, ND Central nervous system: no gross focal neurologic deficits, normal speech Extremities: moves all, no edema, normal tone Psychiatry: Affect depressed and sad expressions     Vitals:   03/17/23 0304 03/17/23 0802 03/17/23 0804 03/17/23 1126  BP: (!) 165/89 (!) 89/67 92/66 (!) 161/93  Pulse: 66 68 69 73  Resp: 18 18  17   Temp: 97.6 F (36.4 C) 98.3 F (36.8 C)    TempSrc: Oral     SpO2: 100% 100% 100% 100%  Weight:      Height:        Data Reviewed: I have reviewed patient's CT scan of the brain that showed no acute intracranial pathology I reviewed patient's chest x-ray that did not show any acute intrapulmonary pathology    Latest Ref Rng & Units 03/16/2023    7:05 AM 03/15/2023    6:10 AM 03/13/2023    5:19 AM  BMP  Glucose 70 - 99 mg/dL 295  188    416  606   BUN 8 - 23 mg/dL 49  50    49  37   Creatinine 0.61 - 1.24 mg/dL 3.01  6.01    0.93  2.35   Sodium 135 - 145 mmol/L 136  137    136  145   Potassium 3.5 - 5.1 mmol/L 4.7  5.3    5.3  4.1   Chloride 98 - 111 mmol/L 104  102    100  110   CO2 22 - 32 mmol/L 27  29    27  29    Calcium 8.9 - 10.3 mg/dL 8.2  8.5    8.4  8.6         Latest Ref Rng & Units 03/16/2023    7:05 AM 03/15/2023    6:10 AM 03/11/2023    5:40 AM  CBC  WBC 4.0 - 10.5 K/uL 4.0  3.7  2.9   Hemoglobin 13.0 - 17.0 g/dL 7.9  8.7  8.4   Hematocrit 39.0 - 52.0 % 24.8  27.1  26.0   Platelets 150 - 400 K/uL 244  268  219      Family Communication: Nobody was present at bedside during a.m. rounds   Time spent: 40 minutes  Author: Gillis Santa, MD 03/17/2023 3:41 PM  For on call review [email or web address].

## 2023-03-17 NOTE — Plan of Care (Signed)
  Problem: Education: Goal: Ability to describe self-care measures that may prevent or decrease complications (Diabetes Survival Skills Education) will improve Outcome: Progressing Goal: Individualized Educational Video(s) Outcome: Progressing   Problem: Coping: Goal: Ability to adjust to condition or change in health will improve Outcome: Progressing   Problem: Health Behavior/Discharge Planning: Goal: Ability to identify and utilize available resources and services will improve Outcome: Progressing Goal: Ability to manage health-related needs will improve Outcome: Progressing   Problem: Metabolic: Goal: Ability to maintain appropriate glucose levels will improve Outcome: Progressing

## 2023-03-17 NOTE — Consult Note (Addendum)
Consultation Note Date: 03/17/2023   Patient Name: Nicholas Weiss  DOB: 10-Sep-1950  MRN: [id number]  Age / Sex: 72 y.o., male  PCP: Marisue Ivan, MD Referring Physician: Gillis Santa, MD  Reason for Consultation: Establishing goals of care  HPI/Patient Profile: Per EMR note: "Nicholas Weiss is a 72 y.o. male with medical history significant for hypertension, prior CVA, multiple myeloma, CKD 3B, paroxysmal A-fib, type 1 diabetes mellitus, A-fib on Eliquis, myoclonus on Keppra, dysautonomia/orthostatic hypotension, with recurrent syncope, hospitalized from 11/18 to 02/24/2023 with intraventricular hemorrhage related to a syncopal event who presents by EMS with altered mental status noted when his partner visited him at his facility.  EMS reported that patient had been altered for the past 3 days and his blood glucose readings were high.  Patient was found to have elevated troponin as well as labs showing DKA and subsequently admitted for further management."   Clinical Assessment and Goals of Care: Notes and labs reviewed.  In to see patient.  No family at bedside.  He is currently resting in bed with the covers pulled over his head but removes it with conversation.  Patient states he has a significant other of over 35 years.  He states they have 2 children a 26 year old and a 55 year old.  He states he understands that his sons would be his surrogate decision makers if he is unable to make decisions for himself, and he advises that he is okay with this as they will all work together to make decisions if he is unable to.  He shares that he does not wish to complete H POA papers or living will advanced directives at this time.  He states he is now retired.  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    We discussed his diagnoses, prognosis, GOC, EOL wishes  disposition and options.  Created space and opportunity for patient  to explore thoughts and feelings regarding current medical information.   A detailed discussion was had today regarding advanced directives.  Concepts specific to code status, artifical feeding and hydration, IV antibiotics and rehospitalization were discussed.  The difference between an aggressive medical intervention path and a comfort care path was discussed.  Values and goals of care important to patient and family were attempted to be elicited.  Discussed limitations of medical interventions to prolong quality of life in some situations and discussed the concept of human mortality.  Patient states he would want any and all care possible including CPR to keep him alive as long as possible, and if this changes he will speak with his family about his wishes.     SUMMARY OF RECOMMENDATIONS   Full code/full scope treatment. Patient states he is okay with his sons being his surrogate decision makers if he is unable to make decisions for himself.        Primary Diagnoses: Present on Admission:  Diabetic ketoacidosis without coma associated with type 1 diabetes mellitus (HCC)  Acute renal  failure superimposed on stage 3a chronic kidney disease (HCC)  Acute metabolic encephalopathy  Multiple myeloma (HCC)  Essential (primary) hypertension  Anemia of chronic kidney failure, stage 3 (moderate) (HCC)  Dysautonomia orthostatic hypotension with recurrent syncope  Myoclonus   I have reviewed the medical record, interviewed the patient and family, and examined the patient. The following aspects are pertinent.  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  Tobacco Use   Smoking status: Never   Smokeless tobacco: Never  Vaping Use   Vaping status: Never Used  Substance and Sexual Activity   Alcohol use: Not Currently    Alcohol/week: 0.0 - 1.0 standard drinks of alcohol    Comment: "once every 2 months"   Drug use: Yes    Types: Marijuana, "Crack" cocaine    Comment: last week   Sexual activity: Not Currently    Birth control/protection: None  Other Topics Concern   Not on file  Social History Narrative   Lives with girlfriend "sharon"   Social Drivers of Health   Financial Resource Strain: Low Risk  (01/26/2023)   Received from [facility]   Overall Financial Resource Strain (CARDIA)    Difficulty of Paying Living Expenses: Not very hard  Food Insecurity: Patient Unable To Answer (03/09/2023)   Hunger Vital Sign    Worried About Running Out of Food in the Last Year: Patient unable to answer    Ran Out of Food in the Last Year: Patient unable to answer  Transportation Needs: Patient Unable To Answer (03/09/2023)   PRAPARE - Transportation    Lack of Transportation (Medical): Patient unable to answer    Lack of Transportation (Non-Medical): Patient unable to answer  Physical Activity: Insufficiently Active (01/01/2020)   Exercise Vital Sign    Days of Exercise per Week: 7 days    Minutes of Exercise per Session: 20 min  Stress: No Stress Concern Present (01/01/2020)   [employer] of Occupational Health - Occupational Stress Questionnaire    Feeling of Stress : Not at all  Social Connections: Moderately Isolated (01/01/2020)   Social Connection and Isolation Panel [NHANES]    Frequency of Communication with Friends and Family: More than three times a week    Frequency of Social Gatherings with Friends and Family: More than three times a week    Attends Religious Services: Never    Database administrator or  Organizations: No    Attends Engineer, structural: Never    Marital Status: Living with partner   Family History  Problem Relation Age of Onset   Heart attack Father    Hypertension Sister    Cancer Sister    Scheduled Meds:  apixaban  5 mg Oral BID   ascorbic acid  500 mg Oral Daily   aspirin EC  81 mg Oral Daily   atorvastatin  80 mg Oral QHS   gabapentin  300 mg Oral BID   influenza vaccine adjuvanted  0.5 mL Intramuscular Tomorrow-1000   insulin aspart  0-6 Units Subcutaneous TID WC   insulin  glargine-yfgn  6 Units Subcutaneous QHS   iron polysaccharides  150 mg Oral Daily   levETIRAcetam  500 mg Oral BID   midodrine  10 mg Oral TID WC   mirtazapine  7.5 mg Oral QHS   multivitamin with minerals  1 tablet Oral Daily   polyethylene glycol  17 g Oral Daily   QUEtiapine  25 mg Oral QPM   senna-docusate  1 tablet Oral BID   Vitamin D (Ergocalciferol)  50,000 Units Oral Q7 days   Continuous Infusions:  sodium chloride Stopped (03/17/23 1303)   PRN Meds:.acetaminophen, dextrose, nitroGLYCERIN, ondansetron (ZOFRAN) IV, mouth rinse, traZODone Medications Prior to Admission:  Prior to Admission medications   Medication Sig Start Date End Date Taking? Authorizing Provider  acetaminophen (TYLENOL) 500 MG tablet Take 1,000 mg by mouth daily as needed for mild pain or moderate pain.   Yes [provider]  apixaban (ELIQUIS) 5 MG TABS tablet Take 1 tablet (5 mg total) by mouth 2 (two) times daily. Start Eliquis after 2 weeks which will be 03/02/2023 03/02/23  Yes Gillis Santa, MD  ascorbic acid (VITAMIN C) 500 MG tablet Take 1 tablet (500 mg total) by mouth daily. 04/09/22  Yes Arnetha Courser, MD  atorvastatin (LIPITOR) 80 MG tablet Take 1 tablet (80 mg total) by mouth at bedtime. Hold while taking Paxlovid 04/08/22  Yes Arnetha Courser, MD  bisacodyl 5 MG EC tablet Take 1 tablet (5 mg total) by mouth at bedtime. Skip the dose if no constipation 02/24/23  Yes Gillis Santa,  MD  fludrocortisone (FLORINEF) 0.1 MG tablet Take 1 tablet (0.1 mg total) by mouth daily. Skip the dose if SBP >130 02/25/23  Yes Gillis Santa, MD  gabapentin (NEURONTIN) 300 MG capsule Take 300 mg by mouth 2 (two) times daily. 03/03/23  Yes [provider]  hydrALAZINE (APRESOLINE) 25 MG tablet Take 1 tablet (25 mg total) by mouth 3 (three) times daily as needed (SBP >160). 02/24/23  Yes Gillis Santa, MD  insulin degludec (TRESIBA FLEXTOUCH) 100 UNIT/ML FlexTouch Pen Inject 5 Units into the skin daily. Home med. 11/16/22  Yes Darlin Priestly, MD  levETIRAcetam (KEPPRA) 500 MG tablet Take 1 tablet (500 mg total) by mouth 2 (two) times daily. 01/15/23  Yes Vaslow, Georgeanna Lea, MD  midodrine (PROAMATINE) 10 MG tablet Take 1 tablet (10 mg total) by mouth 3 (three) times daily with meals. Hold if SBP >140 02/24/23  Yes Gillis Santa, MD  Multiple Vitamin (MULTIVITAMIN WITH MINERALS) TABS tablet Take 1 tablet by mouth daily. 04/09/22  Yes Arnetha Courser, MD  NOVOLOG 100 UNIT/ML injection Sliding Scale: Glucose 70 - 120: 0 units  Glucose 121 - 150: 1 unit  Glucose 151 - 200: 2 units Glucose 201 - 250: 3 units  Glucose 251 - 300: 5 units  Glucose 301 - 350: 7 units  Glucose 351 - 400: 9 units  Glucose > 400: call your docotr 06/12/22  Yes Jerald Kief, MD  ondansetron (ZOFRAN) 8 MG tablet Take 8 mg by mouth daily as needed for nausea or vomiting.   Yes [provider]  polyethylene glycol (MIRALAX) 17 g packet Take 17 g by mouth 2 (two) times daily. Skip the dose if no constipation 02/24/23  Yes Gillis Santa, MD  QUEtiapine (SEROQUEL) 25 MG tablet Take 1 tablet (25 mg total) by mouth every evening. 02/24/23  Yes Gillis Santa, MD  traZODone (DESYREL) 50 MG tablet Take 0.5 tablets (25 mg total) by mouth at bedtime as  needed for sleep. 02/24/23  Yes Gillis Santa, MD  Vitamin D, Ergocalciferol, (DRISDOL) 1.25 MG (50000 UNIT) CAPS capsule Take 1 capsule (50,000 Units total) by mouth every 7  (seven) days. 02/25/23 05/26/23 Yes Gillis Santa, MD  traMADol (ULTRAM) 50 MG tablet Take 1 tablet (50 mg total) by mouth every 6 (six) hours as needed. Patient not taking: Reported on 03/11/2023 02/24/23 02/24/24  Gillis Santa, MD   Allergies  Allergen Reactions   Penicillins Other (See Comments)    Childhood allergy -tolerated amoxil 03/2022 Unknown reaction Has patient had a PCN reaction causing immediate rash, facial/tongue/throat swelling, SOB or lightheadedness with hypotension: No Has patient had a PCN reaction causing severe rash involving mucus membranes or skin necrosis: No Has patient had a PCN reaction that required hospitalization No Has patient had a PCN reaction occurring within the last 10 years: No If all of the above answers are "NO", then may proceed with Cephalosporin use.    Review of Systems  All other systems reviewed and are negative.   Physical Exam Pulmonary:     Effort: Pulmonary effort is normal.  Neurological:     Mental Status: He is alert.     Vital Signs: BP (!) 161/93 (BP Location: Left Arm)   Pulse 73   Temp 98.3 F (36.8 C)   Resp 17   Ht 5\' 10"  (1.778 m)   Wt 62.6 kg   SpO2 100%   BMI 19.80 kg/m  Pain Scale: 0-10 POSS *See Group Information*: 1-Acceptable,Awake and alert Pain Score: 3    SpO2: SpO2: 100 % O2 Device:SpO2: 100 % O2 Flow Rate: .O2 Flow Rate (L/min): 0 L/min  IO: Intake/output summary:  Intake/Output Summary (Last 24 hours) at 03/17/2023 1344 Last data filed at 03/17/2023 0254 Gross per 24 hour  Intake 120 ml  Output 500 ml  Net -380 ml    LBM: Last BM Date : 03/16/23 Baseline Weight: Weight: 62.4 kg (From D/C Summary 02/24/23) Most recent weight: Weight: 62.6 kg       Signed by: Morton Stall, NP   Please contact Palliative Medicine Team phone at [phone] for questions and concerns.  For individual provider: See Loretha Stapler

## 2023-03-17 NOTE — Progress Notes (Addendum)
Physical Therapy Treatment Patient Details Name: Nicholas Weiss MRN: [id number] DOB: 10/21/1950 Today's Date: 03/17/2023   History of Present Illness Nicholas Weiss is a 72 y.o. male with medical history significant for hypertension, prior CVA, multiple myeloma, CKD 3B, paroxysmal A-fib, type 1 diabetes mellitus, A-fib on Eliquis, myoclonus on Keppra, dysautonomia/orthostatic hypotension, with recurrent syncope, hospitalized from 11/18 to 02/24/2023 with intraventricular hemorrhage related to a syncopal event who presents by EMS with altered mental status noted when his partner visited him at his facility.  EMS reported that patient had been altered for the past 3 days and his blood glucose readings were high.    PT Comments  Pt was supine (HOB elevated ~ 15 degrees) with blankets over his head upon arrival. He greets Chartered loss adjuster and is agreeable to session. Pt has extensive hypotensive hx however today was able to get OOB and ambulate with MAP >70. Pt does still require assistance for safety to ambulate ~ 60 ft total with use of RW. Once fatigued, pt required more assistance. Pt is not at his baseline and will continue to benefit from skilled PT at DC to maximize his independence and safety while returning to PLOF. DC recs remain appropriate.    If plan is discharge home, recommend the following: A little help with walking and/or transfers;Assistance with cooking/housework;Direct supervision/assist for medications management;Direct supervision/assist for financial management;Assist for transportation;Help with stairs or ramp for entrance     Equipment Recommendations  Other (comment) (Defer to next level of care.)       Precautions / Restrictions Precautions Precautions: Fall Precaution Comments: watch orthostatics Restrictions Weight Bearing Restrictions Per Provider Order: No     Mobility  Bed Mobility Overal bed mobility: Needs Assistance Bed Mobility: Supine to Sit, Sit to Supine   Supine to sit: Min assist Sit to supine: Min assist General bed mobility comments: Min assist required for pt to safely exit L side of bed. Min assist to progress BLEs into bed after OOB activity    Transfers Overall transfer level: Needs assistance Equipment used: Rolling walker (2 wheels) Transfers: Sit to/from Stand Sit to Stand: Min assist  General transfer comment: pt stood from EOB surface with min assist of one. vcs for improved technique. pt does not endorses symptoms of dizziness throughout session. BP MAP > 70 throughout session    Ambulation/Gait Ambulation/Gait assistance: Contact guard assist, Min assist Gait Distance (Feet): 60 Feet Assistive device: Rolling walker (2 wheels) Gait Pattern/deviations: Step-through pattern, Trunk flexed Gait velocity: decreased  General Gait Details: Pt is at high risk of flls due to hx of hypotension. today however pt esily able to ambulate 60 ft. distance limited by author. Once fatigued, pt did require min assist to safely finish last 10 ft to bed due to fatigue." I just havent been OOB in so long, Im beat."    Balance Overall balance assessment: Needs assistance Sitting-balance support: Feet supported, Bilateral upper extremity supported Sitting balance-Leahy Scale: Good Sitting balance - Comments: pt sat EOB with supervision   Standing balance support: Bilateral upper extremity supported, During functional activity, Reliant on assistive device for balance Standing balance-Leahy Scale: Fair Standing balance comment: Pt is at high risk of falls however did well today with OOB activity. reliant on RW for all OOB activity.     Cognition Arousal: Alert Behavior During Therapy: WFL for tasks assessed/performed Overall Cognitive Status: No family/caregiver present to determine baseline cognitive functioning    Awareness: Intellectual Problem Solving: Slow processing, Decreased initiation, Requires  verbal cues, Requires tactile  cues General Comments: Pt was agreeable to session and was actually even motivated. no c/o diozziness or orthostatic hypotensive symptoms throughout session               Pertinent Vitals/Pain Pain Assessment Pain Assessment: No/denies pain Faces Pain Scale: No hurt Pain Intervention(s): Limited activity within patient's tolerance, Monitored during session, Premedicated before session, Repositioned     PT Goals (current goals can now be found in the care plan section) Acute Rehab PT Goals Patient Stated Goal: none stated Progress towards PT goals: Progressing toward goals    Frequency    Min 1X/week           Co-evaluation     PT goals addressed during session: Mobility/safety with mobility        AM-PAC PT "6 Clicks" Mobility   Outcome Measure  Help needed turning from your back to your side while in a flat bed without using bedrails?: A Little Help needed moving from lying on your back to sitting on the side of a flat bed without using bedrails?: A Little Help needed moving to and from a bed to a chair (including a wheelchair)?: A Little Help needed standing up from a chair using your arms (e.g., wheelchair or bedside chair)?: A Lot Help needed to walk in hospital room?: A Lot Help needed climbing 3-5 steps with a railing? : A Lot 6 Click Score: 15    End of Session   Activity Tolerance: Patient limited by fatigue Patient left: in bed;with call bell/phone within reach;with bed alarm set Nurse Communication: Mobility status PT Visit Diagnosis: Unsteadiness on feet (R26.81);Muscle weakness (generalized) (M62.81);Other abnormalities of gait and mobility (R26.89)     Time: 1191-4782 PT Time Calculation (min) (ACUTE ONLY): 13 min  Charges:    $Gait Training: 8-22 mins PT General Charges $$ ACUTE PT VISIT: 1 Visit                     Jetta Lout PTA 03/17/23, 2:30 PM

## 2023-03-17 NOTE — Progress Notes (Signed)
IV Team at bedside for PIV placement. Bilateral arms assessed using ultrasound. No suitable veins found for PIV, but would be able to accommodate midline.GFR low at 33, notified Dr. Lucianne Muss- states okay to leave out PIV for now and encourage PO intake.

## 2023-03-17 NOTE — Care Management Important Message (Signed)
Important Message  Patient Details  Name: EARLEY POCK MRN: [id number] Date of Birth: 1950/08/26   Important Message Given:  Yes - Medicare IM     Bernadette Hoit 03/17/2023, 3:16 PM

## 2023-03-18 DIAGNOSIS — E1010 Type 1 diabetes mellitus with ketoacidosis without coma: Secondary | ICD-10-CM | POA: Diagnosis not present

## 2023-03-18 LAB — GLUCOSE, CAPILLARY
Glucose-Capillary: 145 mg/dL — ABNORMAL HIGH (ref 70–99)
Glucose-Capillary: 153 mg/dL — ABNORMAL HIGH (ref 70–99)
Glucose-Capillary: 165 mg/dL — ABNORMAL HIGH (ref 70–99)
Glucose-Capillary: 230 mg/dL — ABNORMAL HIGH (ref 70–99)
Glucose-Capillary: 235 mg/dL — ABNORMAL HIGH (ref 70–99)
Glucose-Capillary: 260 mg/dL — ABNORMAL HIGH (ref 70–99)
Glucose-Capillary: 260 mg/dL — ABNORMAL HIGH (ref 70–99)
Glucose-Capillary: 46 mg/dL — ABNORMAL LOW (ref 70–99)
Glucose-Capillary: 94 mg/dL (ref 70–99)

## 2023-03-18 LAB — BASIC METABOLIC PANEL
Anion gap: 7 (ref 5–15)
BUN: 39 mg/dL — ABNORMAL HIGH (ref 8–23)
CO2: 24 mmol/L (ref 22–32)
Calcium: 8.1 mg/dL — ABNORMAL LOW (ref 8.9–10.3)
Chloride: 99 mmol/L (ref 98–111)
Creatinine, Ser: 1.64 mg/dL — ABNORMAL HIGH (ref 0.61–1.24)
GFR, Estimated: 45 mL/min — ABNORMAL LOW (ref >60)
Glucose, Bld: 255 mg/dL — ABNORMAL HIGH (ref 70–99)
Potassium: 5.5 mmol/L — ABNORMAL HIGH (ref 3.5–5.1)
Sodium: 130 mmol/L — ABNORMAL LOW (ref 135–145)

## 2023-03-18 LAB — PHOSPHORUS: Phosphorus: 2.8 mg/dL (ref 2.5–4.6)

## 2023-03-18 LAB — CBC
HCT: 24.5 % — ABNORMAL LOW (ref 39.0–52.0)
Hemoglobin: 8 g/dL — ABNORMAL LOW (ref 13.0–17.0)
MCH: 30.5 pg (ref 26.0–34.0)
MCHC: 32.7 g/dL (ref 30.0–36.0)
MCV: 93.5 fL (ref 80.0–100.0)
Platelets: 263 10*3/uL (ref 150–400)
RBC: 2.62 MIL/uL — ABNORMAL LOW (ref 4.22–5.81)
RDW: 14.9 % (ref 11.5–15.5)
WBC: 4.3 10*3/uL (ref 4.0–10.5)
nRBC: 0 % (ref 0.0–0.2)

## 2023-03-18 LAB — HEPATIC FUNCTION PANEL
ALT: 29 U/L (ref 0–44)
AST: 28 U/L (ref 15–41)
Albumin: 2.6 g/dL — ABNORMAL LOW (ref 3.5–5.0)
Alkaline Phosphatase: 75 U/L (ref 38–126)
Bilirubin, Direct: <0.1 mg/dL (ref 0.0–0.2)
Total Bilirubin: 0.3 mg/dL (ref <1.2)
Total Protein: 7.1 g/dL (ref 6.5–8.1)

## 2023-03-18 LAB — MAGNESIUM: Magnesium: 2.1 mg/dL (ref 1.7–2.4)

## 2023-03-18 MED ORDER — PATIROMER SORBITEX CALCIUM 8.4 G PO PACK
16.8000 g | PACK | Freq: Once | ORAL | Status: AC
  Administered 2023-03-18: 16.8 g via ORAL
  Filled 2023-03-18: qty 2

## 2023-03-18 MED ORDER — INSULIN GLARGINE-YFGN 100 UNIT/ML ~~LOC~~ SOLN
5.0000 [IU] | Freq: Every day | SUBCUTANEOUS | Status: DC
  Administered 2023-03-18: 5 [IU] via SUBCUTANEOUS
  Filled 2023-03-18 (×2): qty 0.05

## 2023-03-18 MED ORDER — INSULIN ASPART 100 UNIT/ML IJ SOLN
2.0000 [IU] | Freq: Three times a day (TID) | INTRAMUSCULAR | Status: DC
Start: 2023-03-18 — End: 2023-03-19
  Administered 2023-03-18 (×2): 2 [IU] via SUBCUTANEOUS
  Filled 2023-03-18 (×3): qty 1

## 2023-03-18 NOTE — Inpatient Diabetes Management (Signed)
Inpatient Diabetes Program Recommendations  AACE/ADA: New Consensus Statement on Inpatient Glycemic Control   Target Ranges:  Prepandial:   less than 140 mg/dL      Peak postprandial:   less than 180 mg/dL (1-2 hours)      Critically ill patients:  140 - 180 mg/dL    Latest Reference Range & Units 03/17/23 23:55 03/18/23 00:31 03/18/23 01:10 03/18/23 04:26  Glucose-Capillary 70 - 99 mg/dL 33 (LL) 46 (L) 94 425 (H)    Latest Reference Range & Units 03/17/23 08:00 03/17/23 11:28 03/17/23 17:22 03/17/23 18:35 03/17/23 20:48 03/17/23 22:38  Glucose-Capillary 70 - 99 mg/dL 956 (H) 387 (H)  Novolog 3 units 408 (H)  Novolog 16 units @17 :58 420 (H) 109 (H)    Semglee 6 units    Latest Reference Range & Units 03/16/23 08:40 03/16/23 12:18 03/16/23 16:19 03/16/23 19:44 03/16/23 23:37  Glucose-Capillary 70 - 99 mg/dL 91 564 (H)  Novolog 2 units 294 (H)  Novolog 3 units 84 138 (H)   Review of Glycemic Control  Diabetes history: DM1 Outpatient Diabetes medications: Tresiba 5 units daily, Novolog 0-9 units TID with meals Current orders for Inpatient glycemic control: Semglee 6 units daily at bedtime, Novolog 0-6 units TID with meals,   Inpatient Diabetes Program Recommendations:     Insulin: No Semlgee was given on 03/16/23 (per MAR not given due to CBG 83); as a result glucose trending high; up to 408 mg/dl at 33:29 and 518 mg/dl at 84:16. Patient received Novolog 16 units at 17:58 on 12/18 which was a significant amount of insulin for patient with DM1 that is very sensitive to insulin. Glucose down to 33 mg/dl at 60:63 on 01/60.   Please consider decreasing Semglee to 5 units daily at bedtime and adding Novolog 2 units TID with meals if patient eats at least 50% of meals.   Thanks, Orlando Penner, RN, MSN, CDCES Diabetes Coordinator Inpatient Diabetes Program [phone] (Team Pager from 8am to 5pm)

## 2023-03-18 NOTE — Progress Notes (Signed)
Progress Note   Patient: Nicholas Weiss ZDG:[id number] DOB: [birth year] DOA: 03/08/2023     10 DOS: the patient was seen and examined on 03/18/2023   Brief hospital course: "Nicholas Weiss is a 72 y.o. male with medical history significant for hypertension, prior CVA, multiple myeloma, CKD 3B, paroxysmal A-fib, type 1 diabetes mellitus, A-fib on Eliquis, myoclonus on Keppra, dysautonomia/orthostatic hypotension, with recurrent syncope, hospitalized from 11/18 to 02/24/2023 with intraventricular hemorrhage related to a syncopal event who presents by EMS with altered mental status noted when his partner visited him at his facility.  EMS reported that patient had been altered for the past 3 days and his blood glucose readings were high.  Patient was found to have elevated troponin as well as labs showing DKA and subsequently admitted for further management." See H&P for full HPI on admission & ED course.  Further hospital course and management as outlined below.   Assessment and Plan:  Diabetic ketoacidosis without coma associated with type 1 diabetes mellitus (HCC)-resolved DKA has resolved. Continue subcutaneous insulin & titrate regimen for inpatient goal 140-180 Appreciate diabetes coordinator recommendations 12/17 Reduced Semglee to 6 units at bedtime 12/17 reduced sliding scale insulin 0 to 6 units 3 times daily 12/19 decreased Semglee 5 units nightly, added NovoLog 3 units 3 times daily with meals Monitor CBG's, continue diabetic diet   Hypoglycemic episodes - improved. Still having lows in evening, but less severe (0's to 60's) Hypoglycemia protocol Reduced insulin as above, dc bedtime sliding scale Encourage PO intake including juice 12/17 early morning hypoglycemia most likely due to decreased p.o. intake.  Insulin dose decreased. 12/19 hypoglycemia episode at midnight, insulin dose adjusted.  Elevated troponin likely secondary to demand ischemia  Echo this admission with EF  65-70%, no WMAs, mild asymmetric basal-septal LVH  Troponin trended 438-->506 Suspect demand ischemia secondary to DKA as well as elevation from worsening renal function Patient has no chest pain and EKG nonacute Continue on telemetry Heparin drip discontinued by cardiologist as this is unlikely NSTEMI     Acute metabolic encephalopathy Secondary to DKA CT head nonacute, showing resolution of prior intraventricular hemorrhage Neurologic checks Fall and aspiration precautions    Acute renal failure superimposed on stage 3a chronic kidney disease Creatinine over 4.11 up from baseline of 1.5 Expecting improvement with IV fluid resuscitation Monitor renal function and avoid nephrotoxins Nephro consult if no improvement in creatinine 12/17 Creatinine 2.14 elevated, s/p IV fluid for hydration 12/18 lost IV access, discontinued IVF, recommended continue oral hydration. 12/19 Cr 1.64 gradually improving, patient is able to drink good amount of water now.  History of intraventricular hemorrhage following syncopal event 02/15/2023 Restarted on Eliquis 12/3 CT head showing resolution of prior bleed Resumed home Keppra   Hyperkalemia -- K 5.3 on 03/15/23. 12/16 Lokelma 10 g 3 times daily x 2 doses 12/19 potassium 5.5, Veltassa 16.8 g one-time dose given Continue low potassium diet Monitor BMP   # Atrial fibrillation (HCC) # Chronic anticoagulation Resumed on Eliquis Not on beta-blocker due to chronic hypotension   # Myoclonus: Continue Keppra   # Dysautonomia orthostatic hypotension with recurrent syncope Most recent syncopal episode 11/18 Continue midodrine Continue fall precautions Up with assistance only PT/OT    # Anemia of chronic kidney failure, stage 3 (moderate) Hemoglobin at baseline at 8.2 Stable Monitor CBC's periodically   # Essential (primary) hypertension Holding hydralazine  On midodrine for orthostatic hypotension   # Multiple myelom: No acute disease  suspected  # Anorexia  Pt's significant other requested to try pt on appetite stimulant. --Trial low dose Remeron 7.5 mg at bedtime started, monitor for efficacy or side effects 12/18 started Marinol 5 mg p.o. twice daily due to persistent low appetite  Anemia, mild iron deficiency, iron level 39, transferrin saturation 19% Started oral iron supplement.   DVT prophylaxis: Lovenox   Consults: Cardiology   Advance Care Planning:   Code Status: Full code   Family Communication: at bedside on rounds today   Disposition Plan: SNF    Subjective: No significant events overnight, patient had an episode of hypoglycemia due to excessive dose of insulin given yesterday.  Patient is very sensitive.  Hypoglycemia resolved, in the morning patient is very awake and alert denies any complaints.  Patient's appetite is improved, he is hungry and eating more as per family partner at bedside.  Denied any complaints.   Physical Exam: General exam: NAD, resting comfortably HEENT: Oral mucosa moist, no abnormal findings Respiratory system: on room air, normal respiratory effort. Cardiovascular system: RRR, no pedal edema.   Gastrointestinal system: soft, NT, ND Central nervous system: no gross focal neurologic deficits, normal speech Extremities: moves all, no edema, normal tone Psychiatry: Affect depressed and sad expressions     Vitals:   03/18/23 0817 03/18/23 1108 03/18/23 1152 03/18/23 1302  BP: 109/65   119/62  Pulse: 71   73  Resp: 18 18 18 15   Temp: 97.6 F (36.4 C)   97.6 F (36.4 C)  TempSrc:      SpO2: 100%   100%  Weight:      Height:        Data Reviewed: I have reviewed patient's CT scan of the brain that showed no acute intracranial pathology I reviewed patient's chest x-ray that did not show any acute intrapulmonary pathology    Latest Ref Rng & Units 03/18/2023    8:49 AM 03/16/2023    7:05 AM 03/15/2023    6:10 AM  BMP  Glucose 70 - 99 mg/dL 086  578  469     629   BUN 8 - 23 mg/dL 39  49  50    49   Creatinine 0.61 - 1.24 mg/dL 5.28  4.13  2.44    0.10   Sodium 135 - 145 mmol/L 130  136  137    136   Potassium 3.5 - 5.1 mmol/L 5.5  4.7  5.3    5.3   Chloride 98 - 111 mmol/L 99  104  102    100   CO2 22 - 32 mmol/L 24  27  29    27    Calcium 8.9 - 10.3 mg/dL 8.1  8.2  8.5    8.4        Latest Ref Rng & Units 03/18/2023    8:49 AM 03/16/2023    7:05 AM 03/15/2023    6:10 AM  CBC  WBC 4.0 - 10.5 K/uL 4.3  4.0  3.7   Hemoglobin 13.0 - 17.0 g/dL 8.0  7.9  8.7   Hematocrit 39.0 - 52.0 % 24.5  24.8  27.1   Platelets 150 - 400 K/uL 263  244  268      Family Communication: Male partner was present at bedside in a.m. during my rounds Management plan discussed.  Time spent: 40 minutes  Author: Gillis Santa, MD 03/18/2023 3:44 PM  For on call review [email or web address].

## 2023-03-18 NOTE — TOC Progression Note (Signed)
Transition of Care [facility]) - Progression Note    Patient Details  Name: GAVYNN POLE MRN: [id number] Date of Birth: 10-Feb-1951  Transition of [facility]) CM/SW Contact  Truddie Hidden, RN Phone Number: 03/18/2023, 8:55 AM  Clinical Narrative:    Per [facility] portal Berkley Harvey is pending         Expected Discharge Plan and Services                                               Social Determinants of Health (SDOH) Interventions SDOH Screenings   Food Insecurity: Patient Unable To Answer (03/09/2023)  Housing: Patient Unable To Answer (03/09/2023)  Transportation Needs: Patient Unable To Answer (03/09/2023)  Utilities: Patient Unable To Answer (03/09/2023)  Alcohol Screen: Low Risk  (10/02/2020)  Depression (PHQ2-9): Medium Risk (10/02/2020)  Financial Resource Strain: Low Risk  (01/26/2023)   Received from [facility]  Physical Activity: Insufficiently Active (01/01/2020)  Social Connections: Moderately Isolated (01/01/2020)  Stress: No Stress Concern Present (01/01/2020)  Tobacco Use: Low Risk  (03/08/2023)  Recent Concern: Tobacco Use - Medium Risk (02/15/2023)   Received from [facility]    Readmission Risk Interventions    02/16/2023    3:56 PM 08/24/2022   10:57 AM 05/20/2022   11:41 AM  Readmission Risk Prevention Plan  Transportation Screening Complete Complete Complete  Medication Review Oceanographer) Complete Complete Complete  PCP or Specialist appointment within 3-5 days of discharge Complete Complete Complete  HRI or Home Care Consult Complete Complete Complete  SW Recovery Care/Counseling Consult Not Complete    Palliative Care Screening Not Applicable  Complete  Skilled Nursing Facility Complete Complete Complete

## 2023-03-19 ENCOUNTER — Inpatient Hospital Stay: Payer: 59

## 2023-03-19 ENCOUNTER — Inpatient Hospital Stay: Payer: 59 | Admitting: Oncology

## 2023-03-19 ENCOUNTER — Ambulatory Visit: Payer: 59

## 2023-03-19 DIAGNOSIS — E1010 Type 1 diabetes mellitus with ketoacidosis without coma: Secondary | ICD-10-CM | POA: Diagnosis not present

## 2023-03-19 LAB — PHOSPHORUS: Phosphorus: 2.8 mg/dL (ref 2.5–4.6)

## 2023-03-19 LAB — CBC
HCT: 24 % — ABNORMAL LOW (ref 39.0–52.0)
Hemoglobin: 7.9 g/dL — ABNORMAL LOW (ref 13.0–17.0)
MCH: 30.6 pg (ref 26.0–34.0)
MCHC: 32.9 g/dL (ref 30.0–36.0)
MCV: 93 fL (ref 80.0–100.0)
Platelets: 257 10*3/uL (ref 150–400)
RBC: 2.58 MIL/uL — ABNORMAL LOW (ref 4.22–5.81)
RDW: 14.8 % (ref 11.5–15.5)
WBC: 3.6 10*3/uL — ABNORMAL LOW (ref 4.0–10.5)
nRBC: 0 % (ref 0.0–0.2)

## 2023-03-19 LAB — GLUCOSE, CAPILLARY
Glucose-Capillary: 115 mg/dL — ABNORMAL HIGH (ref 70–99)
Glucose-Capillary: 133 mg/dL — ABNORMAL HIGH (ref 70–99)
Glucose-Capillary: 211 mg/dL — ABNORMAL HIGH (ref 70–99)
Glucose-Capillary: 92 mg/dL (ref 70–99)

## 2023-03-19 LAB — BASIC METABOLIC PANEL
Anion gap: 6 (ref 5–15)
BUN: 41 mg/dL — ABNORMAL HIGH (ref 8–23)
CO2: 26 mmol/L (ref 22–32)
Calcium: 8.4 mg/dL — ABNORMAL LOW (ref 8.9–10.3)
Chloride: 102 mmol/L (ref 98–111)
Creatinine, Ser: 1.7 mg/dL — ABNORMAL HIGH (ref 0.61–1.24)
GFR, Estimated: 43 mL/min — ABNORMAL LOW (ref >60)
Glucose, Bld: 117 mg/dL — ABNORMAL HIGH (ref 70–99)
Potassium: 4.9 mmol/L (ref 3.5–5.1)
Sodium: 134 mmol/L — ABNORMAL LOW (ref 135–145)

## 2023-03-19 LAB — HEPATIC FUNCTION PANEL
ALT: 25 U/L (ref 0–44)
AST: 24 U/L (ref 15–41)
Albumin: 2.6 g/dL — ABNORMAL LOW (ref 3.5–5.0)
Alkaline Phosphatase: 68 U/L (ref 38–126)
Bilirubin, Direct: <0.1 mg/dL (ref 0.0–0.2)
Total Bilirubin: 0.4 mg/dL (ref <1.2)
Total Protein: 6.9 g/dL (ref 6.5–8.1)

## 2023-03-19 LAB — MAGNESIUM: Magnesium: 2.1 mg/dL (ref 1.7–2.4)

## 2023-03-19 MED ORDER — POLYSACCHARIDE IRON COMPLEX 150 MG PO CAPS: 150.0000 mg | ORAL_CAPSULE | Freq: Every day | ORAL | Status: DC

## 2023-03-19 NOTE — TOC Transition Note (Signed)
Transition of Care [facility]) - Discharge Note   Patient Details  Name: Nicholas Weiss MRN: [id number] Date of Birth: 01/05/1951  Transition [facility] Care [facility]) CM/SW Contact:  Truddie Hidden, RN Phone Number: 03/19/2023, 10:03 AM   Clinical Narrative:   Per [employer] portal patient is approved for SNF from 12/19-12/23  Spoke with [name] from [facility]. Patient has been approved to admit today. MD notifed.   Spoke with patient's significant other, [name]. She was advised patient was denied a bed at [facility], but was approved for STR at [facility]. She is agreeable to [facility]. She inquired about LTC for patient. She has been advised patient will require LTSS via the state. [name] has advised the facility social worker can assist with this conversion or she can seek help at [facility] DSS. [name] was advised of potential discharge to facility today.          Patient Goals and CMS Choice            Discharge Placement                       Discharge Plan and Services Additional resources added to the After Visit Summary for                                       Social Drivers of Health (SDOH) Interventions SDOH Screenings   Food Insecurity: Patient Unable To Answer (03/09/2023)  Housing: Patient Unable To Answer (03/09/2023)  Transportation Needs: Patient Unable To Answer (03/09/2023)  Utilities: Patient Unable To Answer (03/09/2023)  Alcohol Screen: Low Risk  (10/02/2020)  Depression (PHQ2-9): Medium Risk (10/02/2020)  Financial Resource Strain: Low Risk  (01/26/2023)   Received [facility]  Physical Activity: Insufficiently Active (01/01/2020)  Social Connections: Moderately Isolated (01/01/2020)  Stress: No Stress Concern Present (01/01/2020)  Tobacco Use: Low Risk  (03/08/2023)  Recent Concern: Tobacco Use - Medium Risk (02/15/2023)   Received from [facility]     Readmission Risk Interventions    02/16/2023    3:56 PM 08/24/2022    10:57 AM 05/20/2022   11:41 AM  Readmission Risk Prevention Plan  Transportation Screening Complete Complete Complete  Medication Review Oceanographer) Complete Complete Complete  PCP or Specialist appointment within 3-5 days of discharge Complete Complete Complete  HRI or Home Care Consult Complete Complete Complete  SW Recovery Care/Counseling Consult Not Complete    Palliative Care Screening Not Applicable  Complete  Skilled Nursing Facility Complete Complete Complete

## 2023-03-19 NOTE — Care Management Important Message (Signed)
Important Message  Patient Details  Name: Nicholas Weiss MRN: [id number] Date of Birth: 09/02/1950   Important Message Given:  Yes - Medicare IM     Bernadette Hoit 03/19/2023, 10:27 AM

## 2023-03-19 NOTE — Discharge Summary (Signed)
[facility] Discharge Summary   Patient: Nicholas Weiss WUX:[id number]  PCP: Nicholas Ivan, MD  Date of admission: 03/08/2023   Date of discharge:  03/19/2023     Discharge Diagnoses:  Principal Problem:   Diabetic ketoacidosis without coma associated with type 1 diabetes mellitus (HCC) Active Problems:   Elevated troponin   NSTEMI (non-ST elevated myocardial infarction) (HCC)   Acute renal failure superimposed on stage 3a chronic kidney disease (HCC)   Acute metabolic encephalopathy   History of intraventricular hemorrhage following syncopal event 02/15/2023   Atrial fibrillation (HCC)   Anemia of chronic kidney failure, stage 3 (moderate) (HCC)   Dysautonomia orthostatic hypotension with recurrent syncope   Myoclonus   Essential (primary) hypertension   Multiple myeloma (HCC)   Chronic anticoagulation   Admitted From: Home Disposition:  SNF   Recommendations for Outpatient Follow-up:  Follow-up with PCP, patient should be seen by an MD in 1 to 2 days, monitor BP and titrate medication accordingly, monitor CBG and titrate dose of insulin accordingly.  Patient is on NovoLog sliding scale and [place].  Continue diabetic diet.  Follow-up with PCP for further management as an outpatient.  If blood sugar remains uncontrolled then patient will benefit from follow-up with endocrinologist Follow up LABS/TEST: BMP in 1 week   Follow-up Information     Callwood, Gerda Diss D, MD. Go in 1 week(s).   Specialties: Cardiology, Internal Medicine Contact information: [address] [place] [address] [phone]                Diet recommendation: Cardiac and Carb modified diet  Activity: The patient is advised to gradually reintroduce usual activities, as tolerated  Discharge Condition: stable  Code Status: Full code   History of present illness: As per the H and P dictated on admission Hospital Course:  "Nicholas Weiss is a 72 y.o. male with medical  history significant for hypertension, prior CVA, multiple myeloma, CKD 3B, paroxysmal A-fib, type 1 diabetes mellitus, A-fib on Eliquis, myoclonus on Keppra, dysautonomia/orthostatic hypotension, with recurrent syncope, hospitalized from 11/18 to 02/24/2023 with intraventricular hemorrhage related to a syncopal event who presents by EMS with altered mental status noted when his partner visited him at his facility.  EMS reported that patient had been altered for the past 3 days and his blood glucose readings were high.  Patient was found to have elevated troponin as well as labs showing DKA and subsequently admitted for further management." See H&P for full HPI on admission & ED course.   Further hospital course and management as outlined below.   Assessment and Plan:   # Diabetic ketoacidosis without coma associated with type 1 DM-resolved DKA has resolved.  Patient was on NovoLog sliding scale, Semglee dose was titrated according to blood sugar.  Continue monitor CBG, continue diabetic diet.  Resumed home dose NovoLog sliding scale and [place].   # Hypoglycemic episodes - improved. Still having lows in evening, but less severe (0's to 60's) Hypoglycemia protocol Reduced insulin as above, dc bedtime sliding scale Encourage PO intake including juice 12/17 early morning hypoglycemia most likely due to decreased p.o. intake.  Insulin dose decreased. 12/19 hypoglycemia episode at midnight, insulin dose adjusted. 12/20 1 more hypoglycemia episodes. # Elevated troponin likely secondary to demand ischemia  Echo this admission with EF 65-70%, no WMAs, mild asymmetric basal-septal LVH  Troponin trended 438-->506. Suspect demand ischemia secondary to DKA as well as elevation from worsening renal function. Patient has no chest pain and  EKG nonacute. Heparin drip discontinued by cardiologist as this is unlikely NSTEMI # Acute metabolic encephalopathy, Secondary to DKA. Resolved  CT head nonacute, showing  resolution of prior intraventricular hemorrhage. # Acute renal failure superimposed on stage 3a chronic kidney disease Creatinine over 4.11 up from baseline of 1.5, improved after IV fluid given for hydration. 12/17 Creatinine 2.14 elevated, s/p IV fluid for hydration 12/18 lost IV access, discontinued IVF, recommended continue oral hydration. 12/19 Cr 1.64 gradually improving, patient is able to drink good amount of water now. 12/20 Cr 1.7, most likely due to dehydration.  Continue oral hydration.  Patient lost IV access.  Repeat BMP after 1 week. History of intraventricular hemorrhage following syncopal event 02/15/2023 Restarted on Eliquis 12/3, CT head showing resolution of prior bleed. Resumed home Keppra # Hyperkalemia -- K 5.3 on 03/15/23. 12/16 Lokelma 10 g 3 times daily x 2 doses 12/19 potassium 5.5, Veltassa 16.8 g one-time dose given. Continue low potassium diet 12/20 potassium 4.9, within normal range.  Repeat BMP in 1 week. # Atrial fibrillation and Chronic anticoagulation, Resumed on Eliquis Not on beta-blocker due to chronic hypotension # Myoclonus: Continue Keppra # Dysautonomia orthostatic hypotension with recurrent syncope Most recent syncopal episode 11/18, Continue midodrine and Florinef as needed..  Continue fall precautions, Up with assistance only. PT/OT eval done, recommend SNF placement. # Anemia of chronic kidney failure, stage 3 (moderate) Hemoglobin at baseline at 8.2, Hb 7.9 today, remained stable.  Repeat CBC after 1 week # Essential (primary) hypertension, but patient has orthostatic hypotension Patient remained on midodrine, continue monitor BP and titrate medications accordingly, use Florinef as needed. # Multiple myelom: No acute disease suspected # Anorexia, s/p Remeron given during hospital stay and also started on Marinol 2 days ago.  Patient's appetite improved, continue to monitor and encourage for oral intake. Anemia, mild iron deficiency, iron level  39, transferrin saturation 19%. Started oral iron supplement.   Body mass index is 19.8 kg/m.  Nutrition Interventions:  - Patient was instructed, not to drive, operate heavy machinery, perform activities at heights, swimming or participation in water activities or provide baby sitting services while on Pain, Sleep and Anxiety Medications; until his outpatient Physician has advised to do so again.  - Also recommended to not to take more than prescribed Pain, Sleep and Anxiety Medications.  Patient was seen by physical therapy, who recommended Therapy, SNF placement, which was arranged. On the day of the discharge the patient's vitals were stable, and no other acute medical condition were reported by patient. the patient was felt safe to be discharge at [facility].  Consultants: Cardiology and palliative care Procedures: None  Discharge Exam: General: Appear in no distress, no Rash; Oral Mucosa Clear, moist. Cardiovascular: S1 and S2 Present, no Murmur, Respiratory: normal respiratory effort, Bilateral Air entry present and no Crackles, no wheezes Abdomen: Bowel Sound present, Soft and no tenderness, no hernia Extremities: no Pedal edema, no calf tenderness Neurology: CN grossly intact, no focal deficits. affect appropriate.  Filed Weights   03/08/23 2300 03/10/23 0400  Weight: 62.4 kg 62.6 kg   Vitals:   03/19/23 0951 03/19/23 1141  BP: 117/73 (!) 148/85  Pulse: 65 69  Resp: 20 18  Temp: 97.8 F (36.6 C) 98.2 F (36.8 C)  SpO2: 100% 100%    DISCHARGE MEDICATION: Allergies as of 03/19/2023       Reactions   Penicillins Other (See Comments)   Childhood allergy -tolerated amoxil 03/2022 Unknown reaction Has patient had a PCN  reaction causing immediate rash, facial/tongue/throat swelling, SOB or lightheadedness with hypotension: No Has patient had a PCN reaction causing severe rash involving mucus membranes or skin necrosis: No Has patient had a PCN reaction that required  hospitalization No Has patient had a PCN reaction occurring within the last 10 years: No If all of the above answers are "NO", then may proceed with Cephalosporin use.        Medication List     STOP taking these medications    QUEtiapine 25 MG tablet Commonly known as: SEROQUEL   traMADol 50 MG tablet Commonly known as: Ultram       TAKE these medications    acetaminophen 500 MG tablet Commonly known as: TYLENOL Take 1,000 mg by mouth daily as needed for mild pain or moderate pain.   apixaban 5 MG Tabs tablet Commonly known as: ELIQUIS Take 1 tablet (5 mg total) by mouth 2 (two) times daily. Start Eliquis after 2 weeks which will be 03/02/2023   ascorbic acid 500 MG tablet Commonly known as: VITAMIN C Take 1 tablet (500 mg total) by mouth daily.   atorvastatin 80 MG tablet Commonly known as: LIPITOR Take 1 tablet (80 mg total) by mouth at bedtime. Hold while taking Paxlovid   bisacodyl 5 MG EC tablet Commonly known as: bisacodyl Take 1 tablet (5 mg total) by mouth at bedtime. Skip the dose if no constipation   fludrocortisone 0.1 MG tablet Commonly known as: FLORINEF Take 1 tablet (0.1 mg total) by mouth daily. Skip the dose if SBP >130   gabapentin 300 MG capsule Commonly known as: NEURONTIN Take 300 mg by mouth 2 (two) times daily.   hydrALAZINE 25 MG tablet Commonly known as: APRESOLINE Take 1 tablet (25 mg total) by mouth 3 (three) times daily as needed (SBP >160).   iron polysaccharides 150 MG capsule Commonly known as: NIFEREX Take 1 capsule (150 mg total) by mouth daily. Start taking on: March 20, 2023   levETIRAcetam 500 MG tablet Commonly known as: KEPPRA Take 1 tablet (500 mg total) by mouth 2 (two) times daily.   midodrine 10 MG tablet Commonly known as: PROAMATINE Take 1 tablet (10 mg total) by mouth 3 (three) times daily with meals. Hold if SBP >140   multivitamin with minerals Tabs tablet Take 1 tablet by mouth daily.   NovoLOG  100 UNIT/ML injection Generic drug: insulin aspart Sliding Scale: Glucose 70 - 120: 0 units  Glucose 121 - 150: 1 unit  Glucose 151 - 200: 2 units Glucose 201 - 250: 3 units  Glucose 251 - 300: 5 units  Glucose 301 - 350: 7 units  Glucose 351 - 400: 9 units  Glucose > 400: call your docotr   ondansetron 8 MG tablet Commonly known as: ZOFRAN Take 8 mg by mouth daily as needed for nausea or vomiting.   polyethylene glycol 17 g packet Commonly known as: MiraLax Take 17 g by mouth 2 (two) times daily. Skip the dose if no constipation   traZODone 50 MG tablet Commonly known as: DESYREL Take 0.5 tablets (25 mg total) by mouth at bedtime as needed for sleep.   Evaristo Bury FlexTouch 100 UNIT/ML FlexTouch Pen Generic drug: insulin degludec Inject 5 Units into the skin daily. Home med.   Vitamin Weiss (Ergocalciferol) 1.25 MG (50000 UNIT) Caps capsule Commonly known as: DRISDOL Take 1 capsule (50,000 Units total) by mouth every 7 (seven) days.       Allergies  Allergen Reactions  Penicillins Other (See Comments)    Childhood allergy -tolerated amoxil 03/2022 Unknown reaction Has patient had a PCN reaction causing immediate rash, facial/tongue/throat swelling, SOB or lightheadedness with hypotension: No Has patient had a PCN reaction causing severe rash involving mucus membranes or skin necrosis: No Has patient had a PCN reaction that required hospitalization No Has patient had a PCN reaction occurring within the last 10 years: No If all of the above answers are "NO", then may proceed with Cephalosporin use.    Discharge Instructions     Call MD for:  difficulty breathing, headache or visual disturbances   Complete by: As directed    Call MD for:  extreme fatigue   Complete by: As directed    Call MD for:  persistant dizziness or light-headedness   Complete by: As directed    Call MD for:  persistant nausea and vomiting   Complete by: As directed    Call MD for:  severe  uncontrolled pain   Complete by: As directed    Call MD for:  temperature >100.4   Complete by: As directed    Diet - low sodium heart healthy   Complete by: As directed    Discharge instructions   Complete by: As directed    Follow-up with PCP, patient should be seen by an MD in 1 to 2 days, monitor BP and titrate medication accordingly, monitor CBG and titrate dose of insulin accordingly.  Patient is on NovoLog sliding scale and [place].  Continue diabetic diet.  Follow-up with PCP for further management as an outpatient.  If blood sugar remains uncontrolled then patient will benefit from follow-up with endocrinologist.   Increase activity slowly   Complete by: As directed        The results of significant diagnostics from this hospitalization (including imaging, microbiology, ancillary and laboratory) are listed below for reference.    Significant Diagnostic Studies: ECHOCARDIOGRAM COMPLETE Result Date: 03/10/2023    ECHOCARDIOGRAM REPORT   Patient Name:   ELIHUE LUNDSTEN Date of Exam: 03/10/2023 Medical Rec #:  [id number]       Height:       70.0 in Accession #:    [id number]      Weight:       138.0 lb Date of Birth:  16-Sep-1950      BSA:          1.783 m Patient Age:    71 years        BP:           156/86 mmHg Patient Gender: M               HR:           87 bpm. Exam Location:  [facility] Procedure: 2D Echo, Cardiac Doppler and Color Doppler Indications:     Elevated Troponin  History:         Patient has prior history of Echocardiogram examinations, most                  recent 08/19/2022. Acute MI, Stroke, Arrythmias:Atrial                  Fibrillation, Signs/Symptoms:Dizziness/Lightheadedness, Syncope                  and Bacteremia; Risk Factors:Hypertension, Diabetes and                  Dyslipidemia. Substance abuse, CKD.  Sonographer:  Mikki Harbor Referring Phys:  [id number] CARALYN HUDSON Diagnosing Phys: Alwyn Pea MD  Sonographer Comments: Image acquisition challenging due  to uncooperative patient. IMPRESSIONS  1. Left ventricular ejection fraction, by estimation, is 65 to 70%. The left ventricle has normal function. The left ventricle has no regional wall motion abnormalities. There is mild asymmetric left ventricular hypertrophy of the basal-septal segment. Left ventricular diastolic parameters are consistent with Grade I diastolic dysfunction (impaired relaxation).  2. Right ventricular systolic function is normal. The right ventricular size is normal. There is normal pulmonary artery systolic pressure.  3. Left atrial size was mildly dilated.  4. Right atrial size was mildly dilated.  5. The mitral valve is normal in structure. No evidence of mitral valve regurgitation.  6. The aortic valve is normal in structure. Aortic valve regurgitation is not visualized. FINDINGS  Left Ventricle: Left ventricular ejection fraction, by estimation, is 65 to 70%. The left ventricle has normal function. The left ventricle has no regional wall motion abnormalities. The left ventricular internal cavity size was normal in size. There is  mild asymmetric left ventricular hypertrophy of the basal-septal segment. Left ventricular diastolic parameters are consistent with Grade I diastolic dysfunction (impaired relaxation). Right Ventricle: The right ventricular size is normal. No increase in right ventricular wall thickness. Right ventricular systolic function is normal. There is normal pulmonary artery systolic pressure. The tricuspid regurgitant velocity is 2.49 m/s, and  with an assumed right atrial pressure of 8 mmHg, the estimated right ventricular systolic pressure is 32.8 mmHg. Left Atrium: Left atrial size was mildly dilated. Right Atrium: Right atrial size was mildly dilated. Pericardium: There is no evidence of pericardial effusion. Mitral Valve: The mitral valve is normal in structure. No evidence of mitral valve regurgitation. MV peak gradient, 9.6 mmHg. The mean mitral valve gradient is 2.0  mmHg. Tricuspid Valve: The tricuspid valve is normal in structure. Tricuspid valve regurgitation is trivial. Aortic Valve: The aortic valve is normal in structure. Aortic valve regurgitation is not visualized. Aortic valve mean gradient measures 2.0 mmHg. Aortic valve peak gradient measures 4.1 mmHg. Aortic valve area, by VTI measures 3.29 cm. Pulmonic Valve: The pulmonic valve was normal in structure. Pulmonic valve regurgitation is not visualized. Aorta: The ascending aorta was not well visualized. IAS/Shunts: No atrial level shunt detected by color flow Doppler.  LEFT VENTRICLE PLAX 2D LVIDd:         3.90 cm LVIDs:         2.40 cm LV PW:         1.20 cm LV IVS:        1.50 cm LVOT diam:     2.40 cm LV SV:         61 LV SV Index:   34 LVOT Area:     4.52 cm  RIGHT VENTRICLE RV Basal diam:  3.80 cm RV Mid diam:    3.30 cm TAPSE (M-mode): 2.4 cm LEFT ATRIUM             Index        RIGHT ATRIUM           Index LA diam:        4.40 cm 2.47 cm/m   RA Area:     15.20 cm LA Vol (A2C):   84.9 ml 47.62 ml/m  RA Volume:   38.50 ml  21.59 ml/m LA Vol (A4C):   60.1 ml 33.71 ml/m LA Biplane Vol: 71.3 ml 39.99 ml/m  AORTIC VALVE  PULMONIC VALVE AV Area (Vmax):    3.01 cm     PV Vmax:       0.82 m/s AV Area (Vmean):   3.57 cm     PV Peak grad:  2.7 mmHg AV Area (VTI):     3.29 cm AV Vmax:           101.00 cm/s AV Vmean:          57.100 cm/s AV VTI:            0.184 m AV Peak Grad:      4.1 mmHg AV Mean Grad:      2.0 mmHg LVOT Vmax:         67.10 cm/s LVOT Vmean:        45.000 cm/s LVOT VTI:          0.134 m LVOT/AV VTI ratio: 0.73  AORTA Ao Root diam: 3.70 cm Ao Asc diam:  3.50 cm MITRAL VALVE                TRICUSPID VALVE MV Area (PHT): 7.16 cm     TR Peak grad:   24.8 mmHg MV Area VTI:   2.14 cm     TR Vmax:        249.00 cm/s MV Peak grad:  9.6 mmHg MV Mean grad:  2.0 mmHg     SHUNTS MV Vmax:       1.55 m/s     Systemic VTI:  0.13 m MV Vmean:      60.1 cm/s    Systemic Diam: 2.40 cm MV Decel  Time: 106 msec MV E velocity: 122.00 cm/s Alwyn Pea MD Electronically signed by Alwyn Pea MD Signature Date/Time: 03/10/2023/12:56:50 PM    Final    DG Chest Port 1 View Result Date: 03/09/2023 CLINICAL DATA:  DKA EXAM: PORTABLE CHEST 1 VIEW COMPARISON:  02/12/2023 FINDINGS: Heart and mediastinal contours are within normal limits. No focal opacities or effusions. No acute bony abnormality. Aortic atherosclerosis. IMPRESSION: No active disease. Electronically Signed   By: Charlett Nose M.Weiss.   On: 03/09/2023 01:10   CT Head Wo Contrast Result Date: 03/08/2023 CLINICAL DATA:  Follow-up recent intracranial hemorrhage. EXAM: CT HEAD WITHOUT CONTRAST TECHNIQUE: Contiguous axial images were obtained from the base of the skull through the vertex without intravenous contrast. RADIATION DOSE REDUCTION: This exam was performed according to the departmental dose-optimization program which includes automated exposure control, adjustment of the mA and/or kV according to patient size and/or use of iterative reconstruction technique. COMPARISON:  Head CT dated 02/20/2023. FINDINGS: Evaluation of this exam is limited due to motion artifact. Brain: Mild age-related atrophy and moderate chronic microvascular ischemic changes. Interval resolution of the previously seen intraventricular hemorrhage. No acute intracranial hemorrhage. No mass effect or midline shift. No extra-axial fluid collection. Vascular: No hyperdense vessel or unexpected calcification. Skull: Normal. Negative for fracture or focal lesion. Sinuses/Orbits: No acute finding. Other: None IMPRESSION: 1. No acute intracranial pathology. 2. Interval resolution of the previously seen intraventricular hemorrhage. 3. Mild age-related atrophy and moderate chronic microvascular ischemic changes. Electronically Signed   By: Elgie Collard M.Weiss.   On: 03/08/2023 22:24   US RENAL Result Date: 02/20/2023 CLINICAL DATA:  [id number] CKD (chronic kidney disease)  stage 3, GFR 30-59 ml/min (HCC) [id number] EXAM: RENAL / URINARY TRACT ULTRASOUND COMPLETE COMPARISON:  December 01, 2020 FINDINGS: Evaluation is limited by limited acoustic windows and shadowing bowel gas Right Kidney: Renal measurements: 9.3 x  3.7 x 4.5 cm = volume: 82 mL. Echogenicity is slightly increased, similar comparison to prior. No mass or hydronephrosis visualized. Left Kidney: Renal measurements: 9.2 x 5.4 x 5.8 cm = volume: 150 mL. Echogenicity within normal limits. No mass or hydronephrosis visualized. Bladder: Appears normal for degree of bladder distention. Prevoid volume 337 ML. Postvoid volume 102 ML. Other: None. IMPRESSION: 1. No hydronephrosis. 2. Postvoid residual of 102 mL. Electronically Signed   By: Meda Klinefelter M.Weiss.   On: 02/20/2023 11:56   CT HEAD WO CONTRAST ( ) Result Date: 02/20/2023 CLINICAL DATA:  72 year old male status post fall. Recent right frontal horn intraventricular hemorrhage after a fall. EXAM: CT HEAD WITHOUT CONTRAST TECHNIQUE: Contiguous axial images were obtained from the base of the skull through the vertex without intravenous contrast. RADIATION DOSE REDUCTION: This exam was performed according to the departmental dose-optimization program which includes automated exposure control, adjustment of the mA and/or kV according to patient size and/or use of iterative reconstruction technique. COMPARISON:  Head CT 02/16/2023 and earlier. FINDINGS: Brain: Ongoing right lateral intraventricular hemorrhage, with mild extension since 02/16/2023. Hyperdense blood now surrounds most of the right choroid plexus. Some of this has read distributed from the right frontal horn. But the hemorrhage volume has increased. Minimal extension into the 3rd ventricle (series 2, image 15). But stable ventricle size and configuration. No ventriculomegaly. Left lateral and 4th ventricles remain spared. No transependymal edema. No superimposed midline shift, mass effect, evidence of mass  lesion,or evidence of cortically based acute infarction. Patchy bilateral white matter hypodensity is stable. Vascular: No suspicious intracranial vascular hyperdensity. Calcified atherosclerosis at the skull base. Skull: Stable and intact. Sinuses/Orbits: Visualized paranasal sinuses and mastoids are stable and well aerated. Other: Stable orbit and scalp soft tissues. No acute injury identified. IMPRESSION: 1. Increased volume of right lateral intraventricular hemorrhage since 02/16/2023, now moderate. But still the blood is fairly isolated to that ventricle; minimal extension into the 3rd ventricle. No associated ventriculomegaly or transependymal edema. 2. Underlying chronic small vessel disease. No other acute intracranial abnormality. Electronically Signed   By: Odessa Fleming M.Weiss.   On: 02/20/2023 05:04    Microbiology: No results found for this or any previous visit (from the past 240 hours).   Labs: CBC: Recent Labs  Lab 03/15/23 0610 03/16/23 0705 03/18/23 0849 03/19/23 0701  WBC 3.7* 4.0 4.3 3.6*  HGB 8.7* 7.9* 8.0* 7.9*  HCT 27.1* 24.8* 24.5* 24.0*  MCV 96.4 97.3 93.5 93.0  PLT 268 244 263 257   Basic Metabolic Panel: Recent Labs  Lab 03/13/23 0519 03/15/23 0610 03/16/23 0705 03/18/23 0849 03/19/23 0701  NA 145 136  137 136 130* 134*  K 4.1 5.3*  5.3* 4.7 5.5* 4.9  CL 110 100  102 104 99 102  CO2 29 27  29 27 24 26   GLUCOSE 105* 286*  290* 112* 255* 117*  BUN 37* 49*  50* 49* 39* 41*  CREATININE 1.56* 2.07*  2.06* 2.14* 1.64* 1.70*  CALCIUM 8.6* 8.4*  8.5* 8.2* 8.1* 8.4*  MG  --  2.3 2.3 2.1 2.1  PHOS 2.4* 2.6 3.7 2.8 2.8   Liver Function Tests: Recent Labs  Lab 03/18/23 0849 03/19/23 0701  AST 28 24  ALT 29 25  ALKPHOS 75 68  BILITOT 0.3 0.4  PROT 7.1 6.9  ALBUMIN 2.6* 2.6*   No results for input(s): "LIPASE", "AMYLASE" in the last 168 hours. No results for input(s): "AMMONIA" in the last 168 hours. Cardiac Enzymes: No  results for input(s):  "CKTOTAL", "CKMB", "CKMBINDEX", "TROPONINI" in the last 168 hours. BNP (last 3 results) No results for input(s): "BNP" in the last 8760 hours. CBG: Recent Labs  Lab 03/18/23 2204 03/19/23 0021 03/19/23 0529 03/19/23 0848 03/19/23 1136  GLUCAP 165* 211* 133* 92 115*    Time spent: 35 minutes  Signed:  Gillis Santa  [facility] 03/19/2023 1:00 PM

## 2023-03-19 NOTE — Plan of Care (Signed)

## 2023-03-19 NOTE — TOC Transition Note (Addendum)
Transition of Care [facility]) - Discharge Note   Patient Details  Name: Nicholas Weiss MRN: [id number] Date of Birth: 02-Nov-1950  Transition of Care [facility]) CM/SW Contact:  Truddie Hidden, RN Phone Number: 03/19/2023, 10:29 AM   Clinical Narrative:    Spoke with [name]  in admissions at [facility] Per facility patient admission confirmed for today. Patient assigned room # 22A Nurse will call report to [phone] Face sheet and medical necessity forms printed to the floor to be added to the EMS pack EMS arranged "He's fourth."  Discharge summary and SNF transfer report sent in HUB.  Nurse, and family notified spoke with patient's SO, [name].  TOC signing off.          Patient Goals and CMS Choice            Discharge Placement                       Discharge Plan and Services Additional resources added to the After Visit Summary for                                       Social Drivers of Health (SDOH) Interventions SDOH Screenings   Food Insecurity: Patient Unable To Answer (03/09/2023)  Housing: Patient Unable To Answer (03/09/2023)  Transportation Needs: Patient Unable To Answer (03/09/2023)  Utilities: Patient Unable To Answer (03/09/2023)  Alcohol Screen: Low Risk  (10/02/2020)  Depression (PHQ2-9): Medium Risk (10/02/2020)  Financial Resource Strain: Low Risk  (01/26/2023)   Received from [facility]  Physical Activity: Insufficiently Active (01/01/2020)  Social Connections: Moderately Isolated (01/01/2020)  Stress: No Stress Concern Present (01/01/2020)  Tobacco Use: Low Risk  (03/08/2023)  Recent Concern: Tobacco Use - Medium Risk (02/15/2023)   Received from [facility]     Readmission Risk Interventions    02/16/2023    3:56 PM 08/24/2022   10:57 AM 05/20/2022   11:41 AM  Readmission Risk Prevention Plan  Transportation Screening Complete Complete Complete  Medication Review Oceanographer) Complete  Complete Complete  PCP or Specialist appointment within 3-5 days of discharge Complete Complete Complete  HRI or Home Care Consult Complete Complete Complete  SW Recovery Care/Counseling Consult Not Complete    Palliative Care Screening Not Applicable  Complete  Skilled Nursing Facility Complete Complete Complete

## 2023-03-26 ENCOUNTER — Emergency Department: Payer: 59

## 2023-03-26 ENCOUNTER — Inpatient Hospital Stay
Admission: EM | Admit: 2023-03-26 | Discharge: 2023-03-31 | DRG: 682 | Disposition: A | Discharge status: Skilled Nursing Facility | Payer: 59 | Source: Skilled Nursing Facility | Attending: Student in an Organized Health Care Education/Training Program | Admitting: Student in an Organized Health Care Education/Training Program

## 2023-03-26 ENCOUNTER — Encounter: Payer: Self-pay | Admitting: Oncology

## 2023-03-26 ENCOUNTER — Other Ambulatory Visit: Payer: Self-pay

## 2023-03-26 DIAGNOSIS — E785 Hyperlipidemia, unspecified: Secondary | ICD-10-CM | POA: Diagnosis present

## 2023-03-26 DIAGNOSIS — G901 Familial dysautonomia [Riley-Day]: Secondary | ICD-10-CM | POA: Diagnosis present

## 2023-03-26 DIAGNOSIS — G9341 Metabolic encephalopathy: Secondary | ICD-10-CM | POA: Diagnosis present

## 2023-03-26 DIAGNOSIS — N179 Acute kidney failure, unspecified: Secondary | ICD-10-CM | POA: Diagnosis present

## 2023-03-26 DIAGNOSIS — Z89422 Acquired absence of other left toe(s): Secondary | ICD-10-CM

## 2023-03-26 DIAGNOSIS — G40909 Epilepsy, unspecified, not intractable, without status epilepticus: Secondary | ICD-10-CM | POA: Diagnosis present

## 2023-03-26 DIAGNOSIS — I480 Paroxysmal atrial fibrillation: Secondary | ICD-10-CM | POA: Diagnosis not present

## 2023-03-26 DIAGNOSIS — E1069 Type 1 diabetes mellitus with other specified complication: Secondary | ICD-10-CM | POA: Diagnosis present

## 2023-03-26 DIAGNOSIS — R3129 Other microscopic hematuria: Secondary | ICD-10-CM | POA: Diagnosis present

## 2023-03-26 DIAGNOSIS — I129 Hypertensive chronic kidney disease with stage 1 through stage 4 chronic kidney disease, or unspecified chronic kidney disease: Secondary | ICD-10-CM | POA: Diagnosis present

## 2023-03-26 DIAGNOSIS — Z7189 Other specified counseling: Secondary | ICD-10-CM | POA: Diagnosis not present

## 2023-03-26 DIAGNOSIS — Z794 Long term (current) use of insulin: Secondary | ICD-10-CM

## 2023-03-26 DIAGNOSIS — Z8679 Personal history of other diseases of the circulatory system: Secondary | ICD-10-CM

## 2023-03-26 DIAGNOSIS — I10 Essential (primary) hypertension: Secondary | ICD-10-CM | POA: Diagnosis not present

## 2023-03-26 DIAGNOSIS — R296 Repeated falls: Secondary | ICD-10-CM | POA: Diagnosis present

## 2023-03-26 DIAGNOSIS — Z1152 Encounter for screening for COVID-19: Secondary | ICD-10-CM | POA: Diagnosis not present

## 2023-03-26 DIAGNOSIS — R6510 Systemic inflammatory response syndrome (SIRS) of non-infectious origin without acute organ dysfunction: Secondary | ICD-10-CM | POA: Diagnosis present

## 2023-03-26 DIAGNOSIS — Z880 Allergy status to penicillin: Secondary | ICD-10-CM

## 2023-03-26 DIAGNOSIS — R6511 Systemic inflammatory response syndrome (SIRS) of non-infectious origin with acute organ dysfunction: Secondary | ICD-10-CM | POA: Diagnosis not present

## 2023-03-26 DIAGNOSIS — E875 Hyperkalemia: Secondary | ICD-10-CM | POA: Diagnosis present

## 2023-03-26 DIAGNOSIS — I951 Orthostatic hypotension: Secondary | ICD-10-CM | POA: Diagnosis present

## 2023-03-26 DIAGNOSIS — D509 Iron deficiency anemia, unspecified: Secondary | ICD-10-CM | POA: Diagnosis present

## 2023-03-26 DIAGNOSIS — C9000 Multiple myeloma not having achieved remission: Secondary | ICD-10-CM | POA: Diagnosis present

## 2023-03-26 DIAGNOSIS — Z681 Body mass index (BMI) 19 or less, adult: Secondary | ICD-10-CM

## 2023-03-26 DIAGNOSIS — E1100 Type 2 diabetes mellitus with hyperosmolarity without nonketotic hyperglycemic-hyperosmolar coma (NKHHC): Secondary | ICD-10-CM | POA: Diagnosis not present

## 2023-03-26 DIAGNOSIS — E1065 Type 1 diabetes mellitus with hyperglycemia: Secondary | ICD-10-CM | POA: Diagnosis present

## 2023-03-26 DIAGNOSIS — N1831 Chronic kidney disease, stage 3a: Secondary | ICD-10-CM | POA: Diagnosis present

## 2023-03-26 DIAGNOSIS — Z8249 Family history of ischemic heart disease and other diseases of the circulatory system: Secondary | ICD-10-CM

## 2023-03-26 DIAGNOSIS — Z8673 Personal history of transient ischemic attack (TIA), and cerebral infarction without residual deficits: Secondary | ICD-10-CM

## 2023-03-26 DIAGNOSIS — R300 Dysuria: Secondary | ICD-10-CM | POA: Diagnosis present

## 2023-03-26 DIAGNOSIS — E872 Acidosis, unspecified: Secondary | ICD-10-CM | POA: Diagnosis present

## 2023-03-26 DIAGNOSIS — Z515 Encounter for palliative care: Secondary | ICD-10-CM

## 2023-03-26 DIAGNOSIS — E43 Unspecified severe protein-calorie malnutrition: Secondary | ICD-10-CM | POA: Diagnosis present

## 2023-03-26 DIAGNOSIS — E1022 Type 1 diabetes mellitus with diabetic chronic kidney disease: Secondary | ICD-10-CM | POA: Diagnosis present

## 2023-03-26 DIAGNOSIS — E7800 Pure hypercholesterolemia, unspecified: Secondary | ICD-10-CM | POA: Diagnosis present

## 2023-03-26 DIAGNOSIS — E1040 Type 1 diabetes mellitus with diabetic neuropathy, unspecified: Secondary | ICD-10-CM

## 2023-03-26 DIAGNOSIS — Z7901 Long term (current) use of anticoagulants: Secondary | ICD-10-CM

## 2023-03-26 DIAGNOSIS — E1043 Type 1 diabetes mellitus with diabetic autonomic (poly)neuropathy: Secondary | ICD-10-CM | POA: Diagnosis present

## 2023-03-26 DIAGNOSIS — E10649 Type 1 diabetes mellitus with hypoglycemia without coma: Secondary | ICD-10-CM | POA: Diagnosis not present

## 2023-03-26 DIAGNOSIS — Z79899 Other long term (current) drug therapy: Secondary | ICD-10-CM

## 2023-03-26 LAB — BASIC METABOLIC PANEL
Anion gap: 7 (ref 5–15)
Anion gap: 6 (ref 5–15)
BUN: 52 mg/dL — ABNORMAL HIGH (ref 8–23)
BUN: 49 mg/dL — ABNORMAL HIGH (ref 8–23)
CO2: 22 mmol/L (ref 22–32)
CO2: 22 mmol/L (ref 22–32)
Calcium: 7.7 mg/dL — ABNORMAL LOW (ref 8.9–10.3)
Calcium: 7.9 mg/dL — ABNORMAL LOW (ref 8.9–10.3)
Chloride: 110 mmol/L (ref 98–111)
Chloride: 110 mmol/L (ref 98–111)
Creatinine, Ser: 1.91 mg/dL — ABNORMAL HIGH (ref 0.61–1.24)
Creatinine, Ser: 1.75 mg/dL — ABNORMAL HIGH (ref 0.61–1.24)
GFR, Estimated: 37 mL/min — ABNORMAL LOW (ref >60)
GFR, Estimated: 41 mL/min — ABNORMAL LOW (ref >60)
Glucose, Bld: 131 mg/dL — ABNORMAL HIGH (ref 70–99)
Glucose, Bld: 72 mg/dL (ref 70–99)
Potassium: 3.8 mmol/L (ref 3.5–5.1)
Potassium: 4.2 mmol/L (ref 3.5–5.1)
Sodium: 138 mmol/L (ref 135–145)
Sodium: 138 mmol/L (ref 135–145)

## 2023-03-26 LAB — COMPREHENSIVE METABOLIC PANEL
ALT: 41 U/L (ref 0–44)
AST: 32 U/L (ref 15–41)
Albumin: 2.9 g/dL — ABNORMAL LOW (ref 3.5–5.0)
Alkaline Phosphatase: 86 U/L (ref 38–126)
Anion gap: 14 (ref 5–15)
BUN: 58 mg/dL — ABNORMAL HIGH (ref 8–23)
CO2: 18 mmol/L — ABNORMAL LOW (ref 22–32)
Calcium: 8.3 mg/dL — ABNORMAL LOW (ref 8.9–10.3)
Chloride: 100 mmol/L (ref 98–111)
Creatinine, Ser: 2.54 mg/dL — ABNORMAL HIGH (ref 0.61–1.24)
GFR, Estimated: 26 mL/min — ABNORMAL LOW (ref >60)
Glucose, Bld: 665 mg/dL (ref 70–99)
Potassium: 4.2 mmol/L (ref 3.5–5.1)
Sodium: 132 mmol/L — ABNORMAL LOW (ref 135–145)
Total Bilirubin: 0.9 mg/dL (ref <1.2)
Total Protein: 7.8 g/dL (ref 6.5–8.1)

## 2023-03-26 LAB — URINALYSIS, ROUTINE W REFLEX MICROSCOPIC
Bilirubin Urine: NEGATIVE
Glucose, UA: 150 mg/dL — AB
Ketones, ur: NEGATIVE mg/dL
Nitrite: NEGATIVE
Protein, ur: 100 mg/dL — AB
Specific Gravity, Urine: 1.019 (ref 1.005–1.030)
WBC, UA: >50 WBC/hpf (ref 0–5)
pH: 5 (ref 5.0–8.0)

## 2023-03-26 LAB — LACTIC ACID, PLASMA
Lactic Acid, Venous: 3.1 mmol/L (ref 0.5–1.9)
Lactic Acid, Venous: 4.5 mmol/L (ref 0.5–1.9)
Lactic Acid, Venous: 1.8 mmol/L (ref 0.5–1.9)

## 2023-03-26 LAB — CBC
HCT: 26.9 % — ABNORMAL LOW (ref 39.0–52.0)
Hemoglobin: 8.3 g/dL — ABNORMAL LOW (ref 13.0–17.0)
MCH: 31.3 pg (ref 26.0–34.0)
MCHC: 30.9 g/dL (ref 30.0–36.0)
MCV: 101.5 fL — ABNORMAL HIGH (ref 80.0–100.0)
Platelets: 239 10*3/uL (ref 150–400)
RBC: 2.65 MIL/uL — ABNORMAL LOW (ref 4.22–5.81)
RDW: 15.3 % (ref 11.5–15.5)
WBC: 3.6 10*3/uL — ABNORMAL LOW (ref 4.0–10.5)
nRBC: 0 % (ref 0.0–0.2)

## 2023-03-26 LAB — RESP PANEL BY RT-PCR (RSV, FLU A&B, COVID)  RVPGX2
Influenza A by PCR: NEGATIVE
Influenza B by PCR: NEGATIVE
Resp Syncytial Virus by PCR: NEGATIVE
SARS Coronavirus 2 by RT PCR: NEGATIVE

## 2023-03-26 LAB — AMMONIA: Ammonia: <10 umol/L (ref 9–35)

## 2023-03-26 LAB — CBG MONITORING, ED: Glucose-Capillary: 112 mg/dL — ABNORMAL HIGH (ref 70–99)

## 2023-03-26 LAB — OSMOLALITY: Osmolality: 333 mosm/kg (ref 275–295)

## 2023-03-26 LAB — BETA-HYDROXYBUTYRIC ACID: Beta-Hydroxybutyric Acid: 0.53 mmol/L — ABNORMAL HIGH (ref 0.05–0.27)

## 2023-03-26 LAB — TROPONIN I (HIGH SENSITIVITY)
Troponin I (High Sensitivity): 14 ng/L (ref <18)
Troponin I (High Sensitivity): 16 ng/L (ref <18)

## 2023-03-26 MED ORDER — DEXTROSE 50 % IV SOLN: 0.0000 mL | INTRAVENOUS | Status: DC | PRN

## 2023-03-26 MED ORDER — VITAMIN D (ERGOCALCIFEROL) 1.25 MG (50000 UNIT) PO CAPS
50000.0000 [IU] | ORAL_CAPSULE | ORAL | Status: DC
  Administered 2023-03-27: 50000 [IU] via ORAL
  Filled 2023-03-26: qty 1

## 2023-03-26 MED ORDER — DEXTROSE IN LACTATED RINGERS 5 % IV SOLN: INTRAVENOUS | Status: DC

## 2023-03-26 MED ORDER — METRONIDAZOLE 500 MG/100ML IV SOLN
500.0000 mg | Freq: Once | INTRAVENOUS | Status: AC
  Administered 2023-03-26: 500 mg via INTRAVENOUS
  Filled 2023-03-26: qty 100

## 2023-03-26 MED ORDER — POLYETHYLENE GLYCOL 3350 17 G PO PACK: 17.0000 g | PACK | Freq: Every day | ORAL | Status: DC | PRN

## 2023-03-26 MED ORDER — LEVETIRACETAM 500 MG PO TABS
500.0000 mg | ORAL_TABLET | Freq: Two times a day (BID) | ORAL | Status: DC
  Administered 2023-03-26 – 2023-03-31 (×10): 500 mg via ORAL
  Filled 2023-03-26 (×10): qty 1

## 2023-03-26 MED ORDER — SODIUM CHLORIDE 0.9 % IV BOLUS
1000.0000 mL | Freq: Once | INTRAVENOUS | Status: AC
  Administered 2023-03-26: 1000 mL via INTRAVENOUS

## 2023-03-26 MED ORDER — LACTATED RINGERS IV SOLN: INTRAVENOUS | Status: DC

## 2023-03-26 MED ORDER — INSULIN ASPART 100 UNIT/ML IJ SOLN
0.0000 [IU] | Freq: Three times a day (TID) | INTRAMUSCULAR | Status: DC
  Administered 2023-03-28: 1 [IU] via SUBCUTANEOUS
  Administered 2023-03-28: 3 [IU] via SUBCUTANEOUS
  Administered 2023-03-29: 5 [IU] via SUBCUTANEOUS
  Administered 2023-03-29: 3 [IU] via SUBCUTANEOUS
  Administered 2023-03-30: 1 [IU] via SUBCUTANEOUS
  Filled 2023-03-26 (×6): qty 1

## 2023-03-26 MED ORDER — INSULIN ASPART 100 UNIT/ML IJ SOLN
0.0000 [IU] | Freq: Every day | INTRAMUSCULAR | Status: DC
  Administered 2023-03-28: 2 [IU] via SUBCUTANEOUS
  Administered 2023-03-29: 3 [IU] via SUBCUTANEOUS
  Filled 2023-03-26 (×2): qty 1

## 2023-03-26 MED ORDER — ONDANSETRON HCL 4 MG PO TABS: 4.0000 mg | ORAL_TABLET | Freq: Four times a day (QID) | ORAL | Status: DC | PRN

## 2023-03-26 MED ORDER — ACETAMINOPHEN 650 MG RE SUPP: 650.0000 mg | Freq: Four times a day (QID) | RECTAL | Status: DC | PRN

## 2023-03-26 MED ORDER — SODIUM CHLORIDE 0.9 % IV BOLUS (SEPSIS)
1000.0000 mL | Freq: Once | INTRAVENOUS | Status: AC
  Administered 2023-03-26: 1000 mL via INTRAVENOUS

## 2023-03-26 MED ORDER — INSULIN REGULAR(HUMAN) IN NACL 100-0.9 UT/100ML-% IV SOLN: INTRAVENOUS | Status: DC

## 2023-03-26 MED ORDER — SODIUM CHLORIDE 0.9 % IV BOLUS (SEPSIS): 1000.0000 mL | Freq: Once | INTRAVENOUS | Status: DC

## 2023-03-26 MED ORDER — ADULT MULTIVITAMIN W/MINERALS CH
1.0000 | ORAL_TABLET | Freq: Every day | ORAL | Status: DC
  Administered 2023-03-26 – 2023-03-31 (×6): 1 via ORAL
  Filled 2023-03-26 (×6): qty 1

## 2023-03-26 MED ORDER — ACETAMINOPHEN 325 MG PO TABS
650.0000 mg | ORAL_TABLET | Freq: Four times a day (QID) | ORAL | Status: DC | PRN
  Filled 2023-03-26: qty 2

## 2023-03-26 MED ORDER — INSULIN GLARGINE-YFGN 100 UNIT/ML ~~LOC~~ SOLN
5.0000 [IU] | Freq: Every day | SUBCUTANEOUS | Status: DC
  Filled 2023-03-26: qty 0.05

## 2023-03-26 MED ORDER — ONDANSETRON HCL 4 MG/2ML IJ SOLN: 4.0000 mg | Freq: Four times a day (QID) | INTRAMUSCULAR | Status: DC | PRN

## 2023-03-26 MED ORDER — POLYSACCHARIDE IRON COMPLEX 150 MG PO CAPS
150.0000 mg | ORAL_CAPSULE | Freq: Every day | ORAL | Status: DC
  Administered 2023-03-27 – 2023-03-31 (×5): 150 mg via ORAL
  Filled 2023-03-26 (×5): qty 1

## 2023-03-26 MED ORDER — SODIUM CHLORIDE 0.9 % IV SOLN
2.0000 g | Freq: Once | INTRAVENOUS | Status: AC
  Administered 2023-03-26: 2 g via INTRAVENOUS
  Filled 2023-03-26: qty 12.5

## 2023-03-26 MED ORDER — SODIUM CHLORIDE 0.9 % IV SOLN
2.0000 g | INTRAVENOUS | Status: DC
  Administered 2023-03-26 – 2023-03-30 (×4): 2 g via INTRAVENOUS
  Filled 2023-03-26 (×4): qty 20

## 2023-03-26 MED ORDER — VANCOMYCIN HCL IN DEXTROSE 1-5 GM/200ML-% IV SOLN
1000.0000 mg | Freq: Once | INTRAVENOUS | Status: AC
  Administered 2023-03-26: 1000 mg via INTRAVENOUS
  Filled 2023-03-26: qty 200

## 2023-03-26 MED ORDER — APIXABAN 5 MG PO TABS
5.0000 mg | ORAL_TABLET | Freq: Two times a day (BID) | ORAL | Status: DC
  Administered 2023-03-26 – 2023-03-31 (×10): 5 mg via ORAL
  Filled 2023-03-26 (×10): qty 1

## 2023-03-26 NOTE — ED Notes (Signed)
FSBS 112. MD Amin notified.

## 2023-03-26 NOTE — Assessment & Plan Note (Signed)
 Outpatient follow-up with oncology.

## 2023-03-26 NOTE — Assessment & Plan Note (Signed)
Currently rate in 90s. -Continuing home Eliquis

## 2023-03-26 NOTE — ED Triage Notes (Signed)
Sent from [facility] health care center for frequent falls.  3 falls today.  Had brain bleed and was in hospital for 8 days and then went back to snf.  Fam also says his bp goes down when he stands.

## 2023-03-26 NOTE — ED Notes (Signed)
Per MD Nelson Chimes ok to hold repeat lactic acid until fluid resuscitation is completed.

## 2023-03-26 NOTE — Assessment & Plan Note (Signed)
Patient with history of labile diabetes and DKA's. -Please see above for management

## 2023-03-26 NOTE — Assessment & Plan Note (Signed)
Likely multifactorial with concern of sepsis but currently no source of infection and hyperosmolar hyperglycemia. Frequent hospitalizations. -Palliative care consult -Continue to monitor -Treat underlying conditions

## 2023-03-26 NOTE — Progress Notes (Signed)
CODE SEPSIS - PHARMACY COMMUNICATION  **Broad Spectrum Antibiotics should be administered within 1 hour of Sepsis diagnosis**  Time Code Sepsis Called/Page Received: 1527  Antibiotics Ordered: Cefepime, metronidazole, vancomycin  Time of 1st antibiotic administration: 1607  Additional action taken by pharmacy: N/A  If necessary, Name of Provider/Nurse Contacted: N/A    Merryl Hacker ,PharmD Clinical Pharmacist  03/26/2023  3:34 PM

## 2023-03-26 NOTE — H&P (Signed)
History and Physical    Patient: Nicholas Weiss:[id number] DOB: 02-25-1951 DOA: 03/26/2023 DOS: the patient was seen and examined on 03/26/2023 PCP: Marisue Ivan, MD  Patient coming from: SNF  Chief Complaint:  Chief Complaint  Patient presents with   Fall   HPI: Nicholas Weiss is a 72 y.o. male with medical history significant of  hypertension, prior CVA, type 1 diabetes with history of frequent DKA, multiple myeloma, CKD, paroxysmal atrial fibrillation on Eliquis, history of myoclonus on Keppra, orthostatic hypotension and recurrent syncope, frequent hospitalizations, was sent to ED from his facility with complaint of altered mental status and frequent falls.  Patient was complaining of burning with urination since yesterday.  Had 3 falls today with generalized weakness and overall not feeling well.  Poor p.o. intake.  No fever or chills.  No nausea, vomiting or diarrhea.  No upper respiratory symptoms.  Patient with history of frequent hospitalization most recent was from 12/9 till 20 when he was admitted for DKA.  History of labile diabetes and autonomic dysfunction.  ED course and data reviewed.  On presentation patient had mild tachycardia and tachypnea, afebrile, labs with WBC of 3.6, hemoglobin 8.3, pseudohyponatremia secondary to hyperglycemia and blood glucose of 665, bicarb 18, BUN 58, creatinine 2.54, lactic acid 3.1 which increased to 4.5 on subsequent check.  Respiratory panel negative for COVID, influenza and RSV.  Troponin 14. Pan scan which include chest x-ray with some right eighth and ninth rib fracture, CT head, cervical spine, CT chest, abdomen and pelvis are all negative for any other acute abnormality.  Patient was given IV fluid and a dose of cefepime, vancomycin and Flagyl.  Blood cultures were drawn.  Unfortunately UA was not done despite being ordered before getting antibiotics.  Review of Systems: As mentioned in the history of present illness. All  other systems reviewed and are negative. Past Medical History:  Diagnosis Date   Acute metabolic encephalopathy 12/02/2020   DKA (diabetic ketoacidoses) 04/06/2016   Hypercholesteremia    Hypertension    Past Surgical History:  Procedure Laterality Date   AMPUTATION TOE Left 04/25/2022   Procedure: LEFT GREAT TOE AMPUTATION AND LEFT PARTIAL 2ND TOE AMPUTATION;  Surgeon: Felecia Shelling, DPM;  Location: [facility] ORS;  Service: Podiatry;  Laterality: Left;   COLONOSCOPY WITH PROPOFOL N/A 02/01/2020   Procedure: COLONOSCOPY WITH PROPOFOL;  Surgeon: Toney Reil, MD;  Location: [facility] ENDOSCOPY;  Service: Gastroenterology;  Laterality: N/A;   ESOPHAGOGASTRODUODENOSCOPY  02/01/2020   Procedure: ESOPHAGOGASTRODUODENOSCOPY (EGD);  Surgeon: Toney Reil, MD;  Location: [facility] ENDOSCOPY;  Service: Gastroenterology;;   ESOPHAGOGASTRODUODENOSCOPY (EGD) WITH PROPOFOL N/A 06/07/2022   Procedure: ESOPHAGOGASTRODUODENOSCOPY (EGD) WITH PROPOFOL;  Surgeon: Kerin Salen, MD;  Location: [facility] ENDOSCOPY;  Service: Gastroenterology;  Laterality: N/A;   KNEE SURGERY Right    Torn meniscus   KNEE SURGERY Left    Social History:  reports that he has never smoked. He has never used smokeless tobacco. He reports that he does not currently use alcohol. He reports current drug use. Drugs: Marijuana and "Crack" cocaine.  Allergies  Allergen Reactions   Penicillins Other (See Comments)    Childhood allergy -tolerated amoxil 03/2022 Unknown reaction Has patient had a PCN reaction causing immediate rash, facial/tongue/throat swelling, SOB or lightheadedness with hypotension: No Has patient had a PCN reaction causing severe rash involving mucus membranes or skin necrosis: No Has patient had a PCN reaction that required hospitalization No Has patient had a PCN reaction occurring within the  last 10 years: No If all of the above answers are "NO", then may proceed with Cephalosporin use.     Family History  Problem  Relation Age of Onset   Heart attack Father    Hypertension Sister    Cancer Sister     Prior to Admission medications   Medication Sig Start Date End Date Taking? Authorizing Provider  acetaminophen (TYLENOL) 500 MG tablet Take 1,000 mg by mouth daily as needed for mild pain or moderate pain.    [provider]  apixaban (ELIQUIS) 5 MG TABS tablet Take 1 tablet (5 mg total) by mouth 2 (two) times daily. Start Eliquis after 2 weeks which will be 03/02/2023 03/02/23   Gillis Santa, MD  ascorbic acid (VITAMIN C) 500 MG tablet Take 1 tablet (500 mg total) by mouth daily. 04/09/22   Arnetha Courser, MD  atorvastatin (LIPITOR) 80 MG tablet Take 1 tablet (80 mg total) by mouth at bedtime. Hold while taking Paxlovid 04/08/22   Arnetha Courser, MD  bisacodyl 5 MG EC tablet Take 1 tablet (5 mg total) by mouth at bedtime. Skip the dose if no constipation 02/24/23   Gillis Santa, MD  fludrocortisone (FLORINEF) 0.1 MG tablet Take 1 tablet (0.1 mg total) by mouth daily. Skip the dose if SBP >130 02/25/23   Gillis Santa, MD  gabapentin (NEURONTIN) 300 MG capsule Take 300 mg by mouth 2 (two) times daily. 03/03/23   [provider]  hydrALAZINE (APRESOLINE) 25 MG tablet Take 1 tablet (25 mg total) by mouth 3 (three) times daily as needed (SBP >160). 02/24/23   Gillis Santa, MD  insulin degludec (TRESIBA FLEXTOUCH) 100 UNIT/ML FlexTouch Pen Inject 5 Units into the skin daily. Home med. 11/16/22   Darlin Priestly, MD  iron polysaccharides (NIFEREX) 150 MG capsule Take 1 capsule (150 mg total) by mouth daily. 03/20/23 06/18/23  Gillis Santa, MD  levETIRAcetam (KEPPRA) 500 MG tablet Take 1 tablet (500 mg total) by mouth 2 (two) times daily. 01/15/23   Vaslow, Georgeanna Lea, MD  midodrine (PROAMATINE) 10 MG tablet Take 1 tablet (10 mg total) by mouth 3 (three) times daily with meals. Hold if SBP >140 02/24/23   Gillis Santa, MD  Multiple Vitamin (MULTIVITAMIN WITH MINERALS) TABS tablet Take 1 tablet by mouth  daily. 04/09/22   Arnetha Courser, MD  NOVOLOG 100 UNIT/ML injection Sliding Scale: Glucose 70 - 120: 0 units  Glucose 121 - 150: 1 unit  Glucose 151 - 200: 2 units Glucose 201 - 250: 3 units  Glucose 251 - 300: 5 units  Glucose 301 - 350: 7 units  Glucose 351 - 400: 9 units  Glucose > 400: call your docotr 06/12/22   Jerald Kief, MD  ondansetron (ZOFRAN) 8 MG tablet Take 8 mg by mouth daily as needed for nausea or vomiting.    [provider]  polyethylene glycol (MIRALAX) 17 g packet Take 17 g by mouth 2 (two) times daily. Skip the dose if no constipation 02/24/23   Gillis Santa, MD  traZODone (DESYREL) 50 MG tablet Take 0.5 tablets (25 mg total) by mouth at bedtime as needed for sleep. 02/24/23   Gillis Santa, MD  Vitamin D, Ergocalciferol, (DRISDOL) 1.25 MG (50000 UNIT) CAPS capsule Take 1 capsule (50,000 Units total) by mouth every 7 (seven) days. 02/25/23 05/26/23  Gillis Santa, MD    Physical Exam: Vitals:   03/26/23 1218 03/26/23 1219 03/26/23 1600  BP: 92/67  99/76  Pulse: (!) 101  80  Resp: 16  (!) 30  Temp: 97.7 F (36.5 C)  97.6 F (36.4 C)  TempSrc: Oral  Oral  SpO2: 99%  100%  Weight:  62.6 kg   Height:  5\' 10"  (1.778 m)     General: Vital signs reviewed.  Frail gentleman, in no acute distress and cooperative with exam.  Head: Normocephalic and atraumatic. Eyes: EOMI, conjunctivae normal, no scleral icterus.  Neck: Supple, trachea midline, normal ROM,  Cardiovascular: RRR, S1 normal, S2 normal, no murmurs, gallops, or rubs. Pulmonary/Chest: Clear to auscultation bilaterally, no wheezes, rales, or rhonchi. Abdominal: Soft, non-tender, non-distended, BS +, Extremities: No lower extremity edema bilaterally,  pulses symmetric and intact bilaterally. No cyanosis or clubbing. Neurological: A&O x1, Strength is normal and symmetric bilaterally, cranial nerve II-XII are grossly intact, no focal motor deficit, sensory intact to light touch bilaterally.   Psychiatric: Normal mood and affect.  Data Reviewed: Prior data reviewed as mentioned above.  Assessment and Plan: * Acute metabolic encephalopathy Likely multifactorial with concern of sepsis but currently no source of infection and hyperosmolar hyperglycemia. Frequent hospitalizations. -Palliative care consult -Continue to monitor -Treat underlying conditions  Systemic inflammatory response syndrome (SIRS) associated with organ dysfunction [facility]) Patient met SIRS response criteria with tachycardia, tachypnea, no leukocytosis but did had lactic acidosis and AKI. All imaging negative for any obvious source of infection. UA was ordered but never done and patient already received broad-spectrum antibiotics in ED. blood cultures ordered and pending. Respiratory panel negative for COVID, flu and are RSV -Check UA-patient was having some dysuria. -If source of infection found then patient met sepsis criteria -Continue with Rocephin for now  Hyperosmolar hyperglycemic state (HHS) (HCC) Patient with significantly elevated blood glucose above 600, nonanion gap metabolic acidosis, likely HHS.  History of labile diabetes and multiple DKA's. -Initially Endo tool orders were placed but his CBG significantly improved with fluid resuscitation so that was discontinued -Starting on SSI  Uncontrolled type 1 diabetes mellitus with hyperglycemia, with long-term current use of insulin (HCC) Patient with history of labile diabetes and DKA's. -Please see above for management  AKI (acute kidney injury) (HCC) Creatinine at 2.54 with baseline appears to be around 1.7 with history of CKD stage IIIa. -Getting IV fluid -Monitor renal function -Avoid nephrotoxins  Hypertension Patient with hydralazine and midodrine all listed in his chart.  Blood pressure pressure currently on lower normal. -Holding all antihypertensives and also awaiting med rec to be completed. -Can restart midodrine if  needed -Continue to monitor  Paroxysmal atrial fibrillation (HCC) Currently rate in 90s. -Continuing home Eliquis  History of intraventricular hemorrhage following syncopal event 02/15/2023 History of intraventricular hemorrhage with syncopal episode in November 2024.  CT head today without any acute abnormality -Eliquis was restarted during prior hospitalization as hemorrhage was resolved. -  Dysautonomia orthostatic hypotension with recurrent syncope Resulted in frequent falls. -PT and OT evaluation -Rechecking orthostatic vitals  Hyperlipidemia -Continue statin  Iron deficiency anemia -Continuing iron supplement  Multiple myeloma (HCC) -Outpatient follow-up with oncology  Seizure disorder (HCC) History of myoclonus. -Continue Keppra    Advance Care Planning:   Code Status: Full Code discussed with patient along with significant other and son.  Consults: None  Family Communication: Significant other and son at bedside  Severity of Illness: The appropriate patient status for this patient is INPATIENT. Inpatient status is judged to be reasonable and necessary in order to provide the required intensity of service to ensure the patient's safety. The patient's presenting symptoms, physical exam  findings, and initial radiographic and laboratory data in the context of their chronic comorbidities is felt to place them at high risk for further clinical deterioration. Furthermore, it is not anticipated that the patient will be medically stable for discharge from the hospital within 2 midnights of admission.   * I certify that at the point of admission it is my clinical judgment that the patient will require inpatient hospital care spanning beyond 2 midnights from the point of admission due to high intensity of service, high risk for further deterioration and high frequency of surveillance required.*  This record has been created using Conservation officer, historic buildings. Errors have  been sought and corrected,but may not always be located. Such creation errors do not reflect on the standard of care.   Author: Arnetha Courser, MD 03/26/2023 5:49 PM  For on call review [email or web address].

## 2023-03-26 NOTE — Assessment & Plan Note (Signed)
Patient met SIRS response criteria with tachycardia, tachypnea, no leukocytosis but did had lactic acidosis and AKI. All imaging negative for any obvious source of infection. UA was ordered but never done and patient already received broad-spectrum antibiotics in ED. blood cultures ordered and pending. Respiratory panel negative for COVID, flu and are RSV -Check UA-patient was having some dysuria. -If source of infection found then patient met sepsis criteria -Continue with Rocephin for now

## 2023-03-26 NOTE — Assessment & Plan Note (Signed)
Hemoglobin decreased to 7.2 this morning, no obvious bleeding.  Likely some dilutional effect -Continuing iron supplement -Monitor hemoglobin and transfuse if below 7

## 2023-03-26 NOTE — ED Notes (Signed)
Patient is difficult IV stick. Current IV positional and not infusing. IV team consulted for better access.

## 2023-03-26 NOTE — Assessment & Plan Note (Signed)
Creatinine at 2.54 with baseline appears to be around 1.7 with history of CKD stage IIIa. -Getting IV fluid -Monitor renal function -Avoid nephrotoxins

## 2023-03-26 NOTE — Assessment & Plan Note (Addendum)
Patient with hydralazine and midodrine all listed in his chart.  Blood pressure pressure currently on lower normal. -Holding all antihypertensives and also awaiting med rec to be completed. -Can restart midodrine if needed -Continue to monitor

## 2023-03-26 NOTE — Assessment & Plan Note (Signed)
Resulted in frequent falls. -PT and OT evaluation -Rechecking orthostatic vitals

## 2023-03-26 NOTE — ED Provider Notes (Signed)
[facility] Provider Note    Event Date/Time   First MD Initiated Contact with Patient 03/26/23 1348     (approximate)   History   Fall   HPI  Nicholas Weiss is a 72 y.o. male past medical history significant for hypertension, prior CVA, multiple myeloma, CKD, paroxysmal atrial fibrillation on Eliquis, history of myoclonus on Keppra, orthostatic hypotension and recurrent syncope, frequent hospitalizations, who presents to the emergency department from [facility] for frequent falls.  Patient had 3 falls today with generalized weakness.  Patient is states that he just does not feel well.  When he goes to stand up his blood pressure drops.  Does state that he started having burning with urination that started today.  Able to state his name but does not know the year.  Unable to state any recent events prior to coming into the hospital today.  Just states that he does not feel well.  Believes he did hit his head during one of the falls.  Denies any headache or change in vision.     Physical Exam   Triage Vital Signs: ED Triage Vitals  Encounter Vitals Group     BP 03/26/23 1218 92/67     Systolic BP Percentile --      Diastolic BP Percentile --      Pulse Rate 03/26/23 1218 (!) 101     Resp 03/26/23 1218 16     Temp 03/26/23 1218 97.7 F (36.5 C)     Temp Source 03/26/23 1218 Oral     SpO2 03/26/23 1218 99 %     Weight 03/26/23 1219 138 lb 0.1 oz (62.6 kg)     Height 03/26/23 1219 5\' 10"  (1.778 m)     Head Circumference --      Peak Flow --      Pain Score --      Pain Loc --      Pain Education --      Exclude from Growth Chart --     Most recent vital signs: Vitals:   03/26/23 1218 03/26/23 1600  BP: 92/67 99/76  Pulse: (!) 101 80  Resp: 16 (!) 30  Temp: 97.7 F (36.5 C) 97.6 F (36.4 C)  SpO2: 99% 100%    Physical Exam Constitutional:      Appearance: He is well-developed. He is ill-appearing.  HENT:     Head:  Atraumatic.     Mouth/Throat:     Mouth: Mucous membranes are dry.  Eyes:     Extraocular Movements: Extraocular movements intact.     Conjunctiva/sclera: Conjunctivae normal.     Pupils: Pupils are equal, round, and reactive to light.  Cardiovascular:     Rate and Rhythm: Regular rhythm. Tachycardia present.  Pulmonary:     Effort: No respiratory distress.  Abdominal:     Tenderness: There is no abdominal tenderness.  Musculoskeletal:     Cervical back: Normal range of motion.     Right lower leg: No edema.     Left lower leg: No edema.  Skin:    General: Skin is warm.     Capillary Refill: Capillary refill takes 2 to 3 seconds.  Neurological:     Mental Status: He is alert. Mental status is at baseline.     IMPRESSION / MDM / ASSESSMENT AND PLAN / ED COURSE  I reviewed the triage vital signs and the nursing notes.  Differential diagnosis including DKA,HHS, intracranial hemorrhage, sepsis,  dehydration, urinary tract infection, ACS  Difficult IV stick, requiring multiple IV sticks, ultrasound IV obtained, delay in antibiotics and blood cultures.  Good perfusion, felt that 30 cc/kg of IV fluids may be detrimental, given 1 L of IV fluids and will reevaluate.  EKG  I, Corena Herter, the attending physician, personally viewed and interpreted this ECG. No obvious P waves, appears to have atrial fibrillation, normal intervals.  No chamber enlargement.  No significant ST elevation or depression.  No findings of acute ischemia or dysrhythmia.  Atrial fibrillation while on cardiac telemetry.  RADIOLOGY I independently reviewed imaging, my interpretation of imaging: CT scan of the head without signs of intracranial hemorrhage.  CT chest abdomen pelvis without contrast obtained given his low GFR without acute obvious fracture and infectious source.  LABS (all labs ordered are listed, but only abnormal results are displayed) Labs interpreted as -    Labs Reviewed   COMPREHENSIVE METABOLIC PANEL - Abnormal; Notable for the following components:      Result Value   Sodium 132 (*)    CO2 18 (*)    Glucose, Bld 665 (*)    BUN 58 (*)    Creatinine, Ser 2.54 (*)    Calcium 8.3 (*)    Albumin 2.9 (*)    GFR, Estimated 26 (*)    All other components within normal limits  CBC - Abnormal; Notable for the following components:   WBC 3.6 (*)    RBC 2.65 (*)    Hemoglobin 8.3 (*)    HCT 26.9 (*)    MCV 101.5 (*)    All other components within normal limits  LACTIC ACID, PLASMA - Abnormal; Notable for the following components:   Lactic Acid, Venous 3.1 (*)    All other components within normal limits  LACTIC ACID, PLASMA - Abnormal; Notable for the following components:   Lactic Acid, Venous 4.5 (*)    All other components within normal limits  CULTURE, BLOOD (SINGLE)  RESP PANEL BY RT-PCR (RSV, FLU A&B, COVID)  RVPGX2  AMMONIA  URINALYSIS, ROUTINE W REFLEX MICROSCOPIC  LACTIC ACID, PLASMA  LACTIC ACID, PLASMA  TROPONIN I (HIGH SENSITIVITY)  TROPONIN I (HIGH SENSITIVITY)     MDM    Patient found to have significantly elevated glucose with a glucose level of 600s, does not meet criteria for DKA with a normal anion gap.  Potassium is 4.2.  Found have acute kidney injury with a creatinine elevated up to 2.5 from a baseline of 1.7.  Initial lactic acid was 3.1.  Leukopenia and chronic anemia.  Patient received 1 L of IV fluids however repeat lactic acid was obtained before IV fluids had completed given he was in a difficult IV stick.  Repeat lactic acid uptrending to 4.5.  Patient started on broad-spectrum antibiotics to cover possible sepsis of unknown source.  Ordered another 1 L of IV fluids, 30 cc/kg is 1.8 L and received a total of 2 L normal saline.  CT scan of the head, chest abdomen pelvis without acute traumatic injury.  No obvious source of an infectious process.  Consult to hospitalist for admission for sepsis and concern for  hyperosmolar syndrome.  Repeat lactic acid obtained.   PROCEDURES:  Critical Care performed: yes  .Critical Care  Performed by: Corena Herter, MD Authorized by: Corena Herter, MD   Critical care provider statement:    Critical care time (minutes):  50   Critical care time was exclusive of:  Separately billable procedures  and treating other patients   Critical care was necessary to treat or prevent imminent or life-threatening deterioration of the following conditions:  Endocrine crisis   Critical care was time spent personally by me on the following activities:  Development of treatment plan with patient or surrogate, discussions with consultants, evaluation of patient's response to treatment, examination of patient, ordering and review of laboratory studies, ordering and review of radiographic studies, ordering and performing treatments and interventions, pulse oximetry, re-evaluation of patient's condition and review of old charts   Care discussed with: admitting provider     Patient's presentation is most consistent with acute presentation with potential threat to life or bodily function.   MEDICATIONS ORDERED IN ED: Medications  lactated ringers infusion (0 mLs Intravenous Hold 03/26/23 1607)  ceFEPIme (MAXIPIME) 2 g in sodium chloride 0.9 % 100 mL IVPB (2 g Intravenous New Bag/Given 03/26/23 1607)  metroNIDAZOLE (FLAGYL) IVPB 500 mg (500 mg Intravenous New Bag/Given 03/26/23 1618)  vancomycin (VANCOCIN) IVPB 1000 mg/200 mL premix (has no administration in time range)  sodium chloride 0.9 % bolus 1,000 mL (1,000 mLs Intravenous New Bag/Given 03/26/23 1604)  sodium chloride 0.9 % bolus 1,000 mL (1,000 mLs Intravenous New Bag/Given 03/26/23 1421)    FINAL CLINICAL IMPRESSION(S) / ED DIAGNOSES   Final diagnoses:  Hyperosmolar hyperglycemic state (HHS) (HCC)  AKI (acute kidney injury) (HCC)     Rx / DC Orders   ED Discharge Orders     None        Note:  This  document was prepared using Dragon voice recognition software and may include unintentional dictation errors.   Corena Herter, MD 03/26/23 [phone]

## 2023-03-26 NOTE — Assessment & Plan Note (Addendum)
Patient with significantly elevated blood glucose above 600, nonanion gap metabolic acidosis, likely HHS.  Increase serum osmolality.  History of labile diabetes and multiple DKA's. -Initially Endo tool orders were placed but his CBG significantly improved with fluid resuscitation so that was discontinued. Blood glucose again elevated this morning with slight worsening of known anion gap metabolic acidosis -Add Smeglee -Starting on SSI -Monitor closely as he is high risk for developing DKA -Giving some more IV fluid

## 2023-03-26 NOTE — Assessment & Plan Note (Signed)
Continue statin. 

## 2023-03-26 NOTE — ED Notes (Signed)
Bladder scan 123

## 2023-03-26 NOTE — ED Notes (Signed)
IV Team at the bedside. 

## 2023-03-26 NOTE — ED Provider Triage Note (Signed)
Emergency Medicine Provider Triage Evaluation Note  Nicholas Weiss , a 72 y.o. male  was evaluated in triage.  Pt complains of AMS, multiple falls.  Review of Systems  Positive:  Negative:   Physical Exam  There were no vitals taken for this visit. Gen:   Very somnolent, NAD, does wake up to voice, unable to answer questions Resp:  Normal effort  MSK:   Moves extremities without difficulty  Other:    Medical Decision Making  Medically screening exam initiated at 12:15 PM.  Appropriate orders placed.  Nicholas Weiss was informed that the remainder of the evaluation will be completed by another provider, this initial triage assessment does not replace that evaluation, and the importance of remaining in the ED until their evaluation is complete.     Cameron Ali, PA-C 03/26/23 1221

## 2023-03-26 NOTE — Assessment & Plan Note (Signed)
History of intraventricular hemorrhage with syncopal episode in November 2024.  CT head today without any acute abnormality -Eliquis was restarted during prior hospitalization as hemorrhage was resolved. -

## 2023-03-26 NOTE — ED Notes (Signed)
Pt brought in from [facility] care he has been falling and altered mental status. Pt denies any injuries per EMS, wound noted to buttocks per EMS. CBG high, VS wnl. BP 88/58

## 2023-03-26 NOTE — Assessment & Plan Note (Signed)
History of myoclonus. -Continue Keppra

## 2023-03-27 ENCOUNTER — Encounter: Payer: Self-pay | Admitting: Internal Medicine

## 2023-03-27 DIAGNOSIS — N179 Acute kidney failure, unspecified: Secondary | ICD-10-CM | POA: Diagnosis not present

## 2023-03-27 DIAGNOSIS — E1100 Type 2 diabetes mellitus with hyperosmolarity without nonketotic hyperglycemic-hyperosmolar coma (NKHHC): Secondary | ICD-10-CM

## 2023-03-27 DIAGNOSIS — Z515 Encounter for palliative care: Secondary | ICD-10-CM | POA: Diagnosis not present

## 2023-03-27 DIAGNOSIS — G9341 Metabolic encephalopathy: Secondary | ICD-10-CM | POA: Diagnosis not present

## 2023-03-27 DIAGNOSIS — R6511 Systemic inflammatory response syndrome (SIRS) of non-infectious origin with acute organ dysfunction: Secondary | ICD-10-CM | POA: Diagnosis not present

## 2023-03-27 DIAGNOSIS — G40909 Epilepsy, unspecified, not intractable, without status epilepticus: Secondary | ICD-10-CM

## 2023-03-27 LAB — BASIC METABOLIC PANEL
Anion gap: 12 (ref 5–15)
Anion gap: 14 (ref 5–15)
Anion gap: 8 (ref 5–15)
BUN: 51 mg/dL — ABNORMAL HIGH (ref 8–23)
BUN: 51 mg/dL — ABNORMAL HIGH (ref 8–23)
BUN: 53 mg/dL — ABNORMAL HIGH (ref 8–23)
CO2: 16 mmol/L — ABNORMAL LOW (ref 22–32)
CO2: 17 mmol/L — ABNORMAL LOW (ref 22–32)
CO2: 20 mmol/L — ABNORMAL LOW (ref 22–32)
Calcium: 8 mg/dL — ABNORMAL LOW (ref 8.9–10.3)
Calcium: 8 mg/dL — ABNORMAL LOW (ref 8.9–10.3)
Calcium: 8.1 mg/dL — ABNORMAL LOW (ref 8.9–10.3)
Chloride: 104 mmol/L (ref 98–111)
Chloride: 107 mmol/L (ref 98–111)
Chloride: 108 mmol/L (ref 98–111)
Creatinine, Ser: 1.82 mg/dL — ABNORMAL HIGH (ref 0.61–1.24)
Creatinine, Ser: 1.94 mg/dL — ABNORMAL HIGH (ref 0.61–1.24)
Creatinine, Ser: 1.99 mg/dL — ABNORMAL HIGH (ref 0.61–1.24)
GFR, Estimated: 35 mL/min — ABNORMAL LOW (ref >60)
GFR, Estimated: 37 mL/min — ABNORMAL LOW (ref >60)
GFR, Estimated: 39 mL/min — ABNORMAL LOW (ref >60)
Glucose, Bld: 323 mg/dL — ABNORMAL HIGH (ref 70–99)
Glucose, Bld: 473 mg/dL — ABNORMAL HIGH (ref 70–99)
Glucose, Bld: 484 mg/dL — ABNORMAL HIGH (ref 70–99)
Potassium: 4.7 mmol/L (ref 3.5–5.1)
Potassium: 4.8 mmol/L (ref 3.5–5.1)
Potassium: 5.2 mmol/L — ABNORMAL HIGH (ref 3.5–5.1)
Sodium: 134 mmol/L — ABNORMAL LOW (ref 135–145)
Sodium: 135 mmol/L (ref 135–145)
Sodium: 137 mmol/L (ref 135–145)

## 2023-03-27 LAB — CBC
HCT: 23 % — ABNORMAL LOW (ref 39.0–52.0)
Hemoglobin: 7.2 g/dL — ABNORMAL LOW (ref 13.0–17.0)
MCH: 30.8 pg (ref 26.0–34.0)
MCHC: 31.3 g/dL (ref 30.0–36.0)
MCV: 98.3 fL (ref 80.0–100.0)
Platelets: 215 10*3/uL (ref 150–400)
RBC: 2.34 MIL/uL — ABNORMAL LOW (ref 4.22–5.81)
RDW: 15.4 % (ref 11.5–15.5)
WBC: 4.2 10*3/uL (ref 4.0–10.5)
nRBC: 0 % (ref 0.0–0.2)

## 2023-03-27 LAB — GLUCOSE, CAPILLARY
Glucose-Capillary: 487 mg/dL — ABNORMAL HIGH (ref 70–99)
Glucose-Capillary: 510 mg/dL (ref 70–99)
Glucose-Capillary: 491 mg/dL — ABNORMAL HIGH (ref 70–99)
Glucose-Capillary: 246 mg/dL — ABNORMAL HIGH (ref 70–99)
Glucose-Capillary: 89 mg/dL (ref 70–99)
Glucose-Capillary: 132 mg/dL — ABNORMAL HIGH (ref 70–99)
Glucose-Capillary: 70 mg/dL (ref 70–99)

## 2023-03-27 MED ORDER — INSULIN ASPART 100 UNIT/ML IJ SOLN
10.0000 [IU] | Freq: Once | INTRAMUSCULAR | Status: AC
  Administered 2023-03-27: 10 [IU] via SUBCUTANEOUS
  Filled 2023-03-27: qty 1

## 2023-03-27 MED ORDER — INSULIN GLARGINE-YFGN 100 UNIT/ML ~~LOC~~ SOLN
5.0000 [IU] | Freq: Two times a day (BID) | SUBCUTANEOUS | Status: DC
  Administered 2023-03-27 – 2023-03-30 (×7): 5 [IU] via SUBCUTANEOUS
  Filled 2023-03-27 (×9): qty 0.05

## 2023-03-27 MED ORDER — LACTATED RINGERS IV SOLN: INTRAVENOUS | Status: AC

## 2023-03-27 MED ORDER — INSULIN ASPART 100 UNIT/ML IJ SOLN
20.0000 [IU] | Freq: Once | INTRAMUSCULAR | Status: AC
  Administered 2023-03-27: 20 [IU] via SUBCUTANEOUS
  Filled 2023-03-27: qty 1

## 2023-03-27 NOTE — Progress Notes (Signed)
       CROSS COVER NOTE  NAME: Nicholas Weiss MRN: [id number] DOB : 18-Sep-1950 ATTENDING PHYSICIAN: Arnetha Courser, MD    Date of Service   03/27/2023   HPI/Events of Note   Cbg 510   Interventions   Assessment/Plan: Novolog 20 units SQ      Donnie Mesa NP [facility] Cross Cover 7pm-7am - check amion for availability Pager [phone]

## 2023-03-27 NOTE — Progress Notes (Signed)
SLP Cancellation Note  Patient Details Name: YAASIR TROTTA MRN: [id number] DOB: 27-Jan-1951   Cancelled treatment:       Reason Eval/Treat Not Completed: Fatigue/lethargy limiting ability to participate. SLP attempted contact with pt x2, found sleeping both times. Briefly opens eyes to verbal stimuli but does not maintain. SLP will re-attempt at later date    KAVEION NATALE 03/27/2023, 1:52 PM

## 2023-03-27 NOTE — Consult Note (Signed)
Consultation Note Date: 03/27/2023   Patient Name: Nicholas Weiss  DOB: 1950/07/25  MRN: [id number]  Age / Sex: 72 y.o., male  PCP: Marisue Ivan, MD Referring Physician: Arnetha Courser, MD  Reason for Consultation: Establishing goals of care   HPI/Brief Hospital Course: 72 y.o. male  with past medical history of HTN, prior CVA, type 1 diabetes with frequent episodes of DKA, multiple myeloma, CKA, PAF on Eliquis, history of myoclonus on Keppra, orthostatic hypotension with recurrent syncope admitted on 03/26/2023 with AMS and frequent falls. Per reports, patient was complaining of dysuria and suffered 3 falls day of admission with associated generalized weakness and poor PO intake.  In ED found to have tachypnea and tachycardia, initial blood glucose elevated but improved with IV fluid resuscitation, elevated lactate Respiratory panel (-) Imaging (-)  Admitted and being treated for SIRS with urine being likely source of infection-cultures pending, acute metabolic encephalopathy and HHS  Noted 5 IP admits in 6 months  Recent hospitalization 12/9-12/20 for DKA-PMT consulted during that admission   Palliative medicine was consulted for assisting with goals of care conversations  Subjective:  Extensive chart review has been completed prior to meeting patient including labs, vital signs, imaging, progress notes, orders, and available advanced directive documents from current and previous encounters.  Visited with Ms. Frie at his bedside. He is resting in bed with covers pulled over his head. He removes covers when his name was called, he is able to open eyes, minimal interaction, responds with head nodding. No family at bedside during time of visit.  Called and spoke with partner [name] and plans set to visit at bedside later today.  Returned to bedside later in the day to meet with [name].  Introduced myself as a Publishing rights manager as a member  of the palliative care team. Explained palliative medicine is specialized medical care for people living with serious illness. It focuses on providing relief from the symptoms and stress of a serious illness. The goal is to improve quality of life for both the patient and the family.   [name] can recall previous conversations had with PMT during prior hospitalization.  [name] shares she and Mr. Dimitriou have been together for over 30 years, never married, together they have two sons. One lives local and the other out of state.  [name] shares Mr. Tiegs has been in and out of the hospital and back and forth to [facility] since October. Prior to October, he was living at home, he intermittently required assistance with ADL's and ambulation. He was also having frequent falls. Prior to initial hospitalization in October, [name] shares Mr. Casten has not "felt well" in many years. She shares he has some good days but most days he spent sleeping most of the day.  We discussed patient's current illness and what it means in the larger context of patient's on-going co-morbidities. Natural disease trajectory and expectations at EOL were discussed.   [name] shares Mr. Brandes has recently shared with her that he is "tired." She is unsure of what he meant by that. [name] shares their sons have reached a point where they understand Mr. Cavitt is likely not going to improve.  We discussed quality of life and what would be important to Mr. Soffer moving forward. [name] shares during his last hospitalization, Mr. Pollan was clear he wished to remain Full Code. [name] is hopeful over the next day or so [name] will be more interactive and be able to engage in goals  of care conversations.  I discussed importance of continued conversations with family/support persons and all members of their medical team regarding overall plan of care and treatment options ensuring decisions are in alignment with patients goals  of care.  All questions/concerns addressed. Emotional support provided to patient/family/support persons. PMT will continue to follow and support patient as needed.  Objective: Primary Diagnoses: Present on Admission:  Hypertension  Paroxysmal atrial fibrillation (HCC)  Hyperlipidemia  Uncontrolled type 1 diabetes mellitus with hyperglycemia, with long-term current use of insulin (HCC)  Dysautonomia orthostatic hypotension with recurrent syncope  Multiple myeloma (HCC)  AKI (acute kidney injury) (HCC)  Acute metabolic encephalopathy   Vital Signs: BP 102/64 (BP Location: Left Arm)   Pulse 98   Temp 98 F (36.7 C)   Resp 18   Ht 5\' 10"  (1.778 m)   Wt 62.3 kg   SpO2 100%   BMI 19.71 kg/m  Pain Scale: 0-10   Pain Score: 0-No pain  IO: Intake/output summary:  Intake/Output Summary (Last 24 hours) at 03/27/2023 1513 Last data filed at 03/27/2023 0901 Gross per 24 hour  Intake 1574.2 ml  Output 300 ml  Net 1274.2 ml    LBM: Last BM Date :  (PTA) Baseline Weight: Weight: 62.6 kg Most recent weight: Weight: 62.3 kg      Assessment and Plan  SUMMARY OF RECOMMENDATIONS   Ongoing GOC Time for outcomes  Palliative Prophylaxis:   Bowel Regimen, Delirium Protocol and Frequent Pain Assessment  Discussed With: Nursing staff   Thank you for this consult and allowing Palliative Medicine to participate in the care of Jareth Ruhmann. Iann[place]. Palliative medicine will continue to follow and assist as needed.   Time Total: 75 minutes  Time spent includes: Detailed review of medical records (labs, imaging, vital signs), medically appropriate exam (mental status, respiratory, cardiac, skin), discussed with treatment team, counseling and educating patient, family and staff, documenting clinical information, medication management and coordination of care.   Signed by: Leeanne Deed, DNP, AGNP-C Palliative Medicine    Please contact Palliative Medicine Team phone at [phone] for  questions and concerns.  For individual provider: See Loretha Stapler

## 2023-03-27 NOTE — Inpatient Diabetes Management (Signed)
Inpatient Diabetes Program Recommendations  AACE/ADA: New Consensus Statement on Inpatient Glycemic Control (2015)  Target Ranges:  Prepandial:   less than 140 mg/dL      Peak postprandial:   less than 180 mg/dL (1-2 hours)      Critically ill patients:  140 - 180 mg/dL   Lab Results  Component Value Date   GLUCAP 246 (H) 03/27/2023   HGBA1C 9.0 (H) 11/14/2022    Review of Glycemic Control  Diabetes history: DM1 Outpatient Diabetes medications: Tresiba 5 daily, Novolog 0-9 S/S Current orders for Inpatient glycemic control: Semglee 5 BID, Novolog 0-9 units TID with meals and 0-5 HS.  HgbA1C 9.0% on 11/14/22 BHB 0.53 on 12/27 CO2 16, 17 on 12/28  Pt given Novolog 20 units 1x dose at 0800 this am Pt given Novolog 10 units 1x dose at 0900 this am  Inpatient Diabetes Program Recommendations:    Watch for hypoglycemia with 30 units of Novolog given within 1.5H period (pt is Type 1 and very sensitive to insulin)  Consider decreasing Novolog to 0-6 Q4H  Updated Beta-Hydroxybutyric Acid (last one 12/27 at 15:57. Elevated at 0.53)  May need IV insulin.  Continue to follow.  Thank you. Ailene Ards, RD, LDN, CDCES Inpatient Diabetes Coordinator [phone]

## 2023-03-27 NOTE — TOC Initial Note (Signed)
Transition of Care [facility]) - Initial/Assessment Note    Patient Details  Name: Nicholas Weiss MRN: [id number] Date of Birth: February 20, 1951  Transition of Care [facility]) CM/SW Contact:    Liliana Cline, LCSW Phone Number: 03/27/2023, 12:15 PM  Clinical Narrative:                 Patient known to [facility] for recent discharge to [facility] SNF for STR on 03/19/23.  TOC will follow for needs admission.   Expected Discharge Plan: Skilled Nursing Facility Barriers to Discharge: Continued Medical Work up   Patient Goals and CMS Choice            Expected Discharge Plan and Services                                              Prior Living Arrangements/Services     Patient language and need for interpreter reviewed:: Yes              Criminal Activity/Legal Involvement Pertinent to Current Situation/Hospitalization: No - Comment as needed  Activities of Daily Living   ADL Screening (condition at time of admission) Independently performs ADLs?: No Does the patient have a NEW difficulty with bathing/dressing/toileting/self-feeding that is expected to last >3 days?: Yes (Initiates electronic notice to provider for possible OT consult) Does the patient have a NEW difficulty with getting in/out of bed, walking, or climbing stairs that is expected to last >3 days?: Yes (Initiates electronic notice to provider for possible PT consult) Does the patient have a NEW difficulty with communication that is expected to last >3 days?: Yes (Initiates electronic notice to provider for possible SLP consult) Is the patient deaf or have difficulty hearing?: No Does the patient have difficulty seeing, even when wearing glasses/contacts?: No Does the patient have difficulty concentrating, remembering, or making decisions?: No  Permission Sought/Granted                  Emotional Assessment         Alcohol / Substance Use: Not Applicable Psych Involvement: No  (comment)  Admission diagnosis:  Paroxysmal atrial fibrillation (HCC) [I48.0] AKI (acute kidney injury) (HCC) [N17.9] Acute metabolic encephalopathy [G93.41] Hyperosmolar hyperglycemic state (HHS) (HCC) [E11.00] Patient Active Problem List   Diagnosis Date Noted   Hyperosmolar hyperglycemic state (HHS) (HCC) 03/26/2023   Iron deficiency anemia 03/26/2023   Seizure disorder (HCC) 03/26/2023   Systemic inflammatory response syndrome (SIRS) associated with organ dysfunction (HCC) 03/26/2023   NSTEMI (non-ST elevated myocardial infarction) (HCC) 03/09/2023   Elevated troponin 03/08/2023   Paroxysmal atrial fibrillation (HCC) 03/08/2023   Chronic anticoagulation 03/08/2023   ICH (intracerebral hemorrhage) (HCC) 02/16/2023   Uncontrolled diabetes mellitus with hyperglycemia (HCC) 02/16/2023   GERD without esophagitis 02/16/2023   Hypertension 02/16/2023   Diabetic neuropathy (HCC) 02/16/2023   Intraventricular hemorrhage (HCC) 02/16/2023   Myoclonus 01/15/2023   Hypothermia 12/29/2022   Dizziness 11/13/2022   Substance abuse (HCC) 09/18/2022   History of GI bleed 09/18/2022   History of intraventricular hemorrhage following syncopal event 02/15/2023 09/18/2022   Uncontrolled type 1 diabetes mellitus with hyperglycemia, with long-term current use of insulin (HCC) 08/26/2022   Postural dizziness with presyncope 08/18/2022   Near syncope 08/18/2022   Closed fracture of shaft of left humerus 06/18/2022   Malnutrition of moderate degree 06/10/2022   Hypotension 06/10/2022  GI bleed 06/06/2022   Subdural hematoma (HCC) 05/15/2022   Falls 05/15/2022   Shoulder pain, left 05/15/2022   History of amputation of left great toe (HCC) 05/04/2022   Hypophosphatemia 04/28/2022   Protein-calorie malnutrition, severe 04/24/2022   Diabetic ulcer of toe associated with diabetes mellitus due to underlying condition, with fat layer exposed (HCC) 04/24/2022   Streptococcal bacteremia 04/24/2022    Leukopenia 04/24/2022   History of multiple myeloma 04/24/2022   Dysautonomia orthostatic hypotension with recurrent syncope 04/24/2022   Weight loss 04/23/2022   Osteomyelitis of left foot (HCC) 04/22/2022   DKA, type 1 (HCC) 04/22/2022   Dehydration 04/08/2022   Diabetic ketoacidosis without coma associated with type 1 diabetes mellitus (HCC) 04/07/2022   Hyperkalemia 04/07/2022   Traumatic pneumothorax 02/18/2022   Multiple fractures of ribs, left side, initial encounter for closed fracture 02/18/2022   Pneumothorax, closed, traumatic 02/18/2022   Benign prostatic hyperplasia with nocturia 02/12/2022   Stroke (cerebrum) (HCC) 11/07/2021   Multiple myeloma (HCC) 09/29/2021   Multiple myeloma not having achieved remission (HCC) 09/29/2021   Hyponatremia 08/18/2021   COVID-19 virus infection 08/13/2021   History of CVA (cerebrovascular accident) 08/13/2021   Syncope and collapse, recurrent 08/09/2021   Hepatic lesion 08/05/2021   Dyslipidemia 05/08/2021   History of substance abuse (HCC) 12/09/2020   Acute metabolic encephalopathy 12/02/2020   Cocaine abuse (HCC) 11/30/2020   Symptomatic anemia 11/08/2020   Muscle twitching 09/12/2020   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   Pre-syncope 08/01/2020   Heart block AV first degree 08/01/2020   Anemia of chronic kidney failure, stage 3 (moderate) (HCC) 05/28/2020   PUD (peptic ulcer disease)    Esophageal dysphagia    Encounter for screening colonoscopy    Hyperglycemia 07/28/2019   Generalized weakness 07/28/2019   Hypoglycemia 04/27/2019   Unresponsiveness 04/27/2019   Lung nodule 04/07/2019   Type 1 diabetes mellitus (HCC)    Acute renal failure superimposed on stage 3a chronic kidney disease (HCC)    Hypertensive urgency 04/05/2019   AKI (acute kidney injury) (HCC) 04/05/2019   CKD (chronic kidney disease) stage 3, GFR 30-59 ml/min (HCC) 04/05/2019   Hyperlipidemia 01/24/2016   Essential (primary) hypertension 06/07/2006    Type 1 diabetes mellitus with diabetic neuropathy, unspecified (HCC) 05/31/2006   PCP:  Marisue Ivan, MD Pharmacy:   [facility] [address] ([address] - [address] [place]) [place] [address] Phone: [phone] Fax: [phone]  [facility] - [place], [place] - [address] [place] [address] Phone: [phone] Fax: [phone]  [facility] - [place], [place] - [address] [place] [address] Phone: [phone] Fax: [phone]  [facility] [address] [place] [address] Phone: [phone] Fax: [phone]     Social Drivers of Health (SDOH) Social History: SDOH Screenings   Food Insecurity: Patient Unable To Answer (03/27/2023)  Housing: Patient Unable To Answer (03/27/2023)  Transportation Needs: Patient Unable To Answer (03/27/2023)  Utilities: Patient Unable To Answer (03/27/2023)  Alcohol Screen: Low Risk  (10/02/2020)  Depression (PHQ2-9): Medium Risk (10/02/2020)  Financial Resource Strain: Low Risk  (01/26/2023)   Received from [facility]  Physical Activity: Insufficiently Active (01/01/2020)  Social Connections: Moderately Isolated (01/01/2020)  Stress: No Stress Concern Present (01/01/2020)  Tobacco Use: Low Risk  (03/27/2023)  Recent Concern: Tobacco Use - Medium Risk (02/15/2023)   Received from [facility]   SDOH Interventions:     Readmission Risk Interventions    02/16/2023    3:56 PM 08/24/2022   10:57 AM 05/20/2022   11:41 AM  Readmission Risk Prevention Plan  Transportation Screening Complete Complete Complete  Medication Review Oceanographer) Complete Complete Complete  PCP or Specialist appointment within 3-5 days of discharge Complete Complete Complete  HRI or Home Care Consult Complete Complete Complete  SW Recovery  Care/Counseling Consult Not Complete    Palliative Care Screening Not Applicable  Complete  Skilled Nursing Facility Complete Complete Complete

## 2023-03-27 NOTE — Hospital Course (Addendum)
Nicholas Weiss is a 72 y.o. male with medical history significant of  hypertension, prior CVA, type 1 diabetes with history of frequent DKA, multiple myeloma, CKD, paroxysmal atrial fibrillation on Eliquis, history of myoclonus on Keppra, orthostatic hypotension and recurrent syncope, frequent hospitalizations, was sent to ED from his facility with complaint of altered mental status and frequent falls.   Patient was complaining of burning with urination since yesterday.  Had 3 falls today with generalized weakness and overall not feeling well.  Poor p.o. intake.  No fever or chills.  No nausea, vomiting or diarrhea.  No upper respiratory symptoms.  Patient with history of frequent hospitalization most recent was from 12/9 till 20 when he was admitted for DKA.  History of labile diabetes and autonomic dysfunction.    On presentation patient had mild tachycardia and tachypnea, afebrile, labs with WBC of 3.6, hemoglobin 8.3, pseudohyponatremia secondary to hyperglycemia and blood glucose of 665, bicarb 18, BUN 58, creatinine 2.54, lactic acid 3.1 which increased to 4.5 on subsequent check.  Respiratory panel negative for COVID, influenza and RSV.  Troponin 14. Pan scan which include chest x-ray with some right eighth and ninth rib fracture, CT head, cervical spine, CT chest, abdomen and pelvis are all negative for any other acute abnormality.   Patient was given IV fluid and a dose of cefepime, vancomycin and Flagyl.  Blood cultures were drawn.  Unfortunately UA was not done despite being ordered before getting antibiotics.  Endo tool with insulin infusion was initially ordered but his blood glucose significantly improved with IV fluid so it was discontinued and he was started on basal and short-acting.  12/28: Vital stable labs with hyperkalemia at 5.2, pseudohyponatremia with hyperglycemia, blood glucose above 400, hemoglobin dropped to 7.2, no obvious bleeding.  Lactic acidosis resolved, preliminary  blood cultures negative, UA with leukocytes and bacteria but it was obtained after getting broad-spectrum antibiotics, cultures pending, giving some extra fluid and insulin and will continue ceftriaxone for now

## 2023-03-27 NOTE — Progress Notes (Signed)
Progress Note   Patient: Nicholas Weiss CWC:[id number] DOB: Sep 16, 1950 DOA: 03/26/2023     1 DOS: the patient was seen and examined on 03/27/2023   Brief hospital course:  Nicholas Weiss is a 72 y.o. male with medical history significant of  hypertension, prior CVA, type 1 diabetes with history of frequent DKA, multiple myeloma, CKD, paroxysmal atrial fibrillation on Eliquis, history of myoclonus on Keppra, orthostatic hypotension and recurrent syncope, frequent hospitalizations, was sent to ED from his facility with complaint of altered mental status and frequent falls.   Patient was complaining of burning with urination since yesterday.  Had 3 falls today with generalized weakness and overall not feeling well.  Poor p.o. intake.  No fever or chills.  No nausea, vomiting or diarrhea.  No upper respiratory symptoms.  Patient with history of frequent hospitalization most recent was from 12/9 till 20 when he was admitted for DKA.  History of labile diabetes and autonomic dysfunction.    On presentation patient had mild tachycardia and tachypnea, afebrile, labs with WBC of 3.6, hemoglobin 8.3, pseudohyponatremia secondary to hyperglycemia and blood glucose of 665, bicarb 18, BUN 58, creatinine 2.54, lactic acid 3.1 which increased to 4.5 on subsequent check.  Respiratory panel negative for COVID, influenza and RSV.  Troponin 14. Pan scan which include chest x-ray with some right eighth and ninth rib fracture, CT head, cervical spine, CT chest, abdomen and pelvis are all negative for any other acute abnormality.   Patient was given IV fluid and a dose of cefepime, vancomycin and Flagyl.  Blood cultures were drawn.  Unfortunately UA was not done despite being ordered before getting antibiotics.  Endo tool with insulin infusion was initially ordered but his blood glucose significantly improved with IV fluid so it was discontinued and he was started on basal and short-acting.  12/28: Vital stable  labs with hyperkalemia at 5.2, pseudohyponatremia with hyperglycemia, blood glucose above 400, hemoglobin dropped to 7.2, no obvious bleeding.  Lactic acidosis resolved, preliminary blood cultures negative, UA with leukocytes and bacteria but it was obtained after getting broad-spectrum antibiotics, cultures pending, giving some extra fluid and insulin and will continue ceftriaxone for now    Assessment and Plan: * Acute metabolic encephalopathy Likely multifactorial with concern of sepsis but currently no source of infection and hyperosmolar hyperglycemia. Frequent hospitalizations.  Seems to be close to baseline now -Palliative care consult -Continue to monitor -Treat underlying conditions  Systemic inflammatory response syndrome (SIRS) associated with organ dysfunction [facility]) Patient met SIRS response criteria with tachycardia, tachypnea, no leukocytosis but did had lactic acidosis and AKI, Lactic acidosis has resolved.  Creatinine at 1.94 All imaging negative for any obvious source of infection. UA was ordered but never done and patient already received broad-spectrum antibiotics in ED. blood cultures ordered and pending.  UA which was obtained after antibiotics with moderate leukocytosis and mild microscopic hematuria, pending urine culture Respiratory panel negative for COVID, flu and are RSV -If source of infection found then patient met sepsis criteria -Continue with Rocephin for now  Hyperosmolar hyperglycemic state (HHS) (HCC) Patient with significantly elevated blood glucose above 600, nonanion gap metabolic acidosis, likely HHS.  Increase serum osmolality.  History of labile diabetes and multiple DKA's. -Initially Endo tool orders were placed but his CBG significantly improved with fluid resuscitation so that was discontinued. Blood glucose again elevated this morning with slight worsening of known anion gap metabolic acidosis -Add Smeglee -Starting on SSI -Monitor closely as he  is high  risk for developing DKA -Giving some more IV fluid  Uncontrolled type 1 diabetes mellitus with hyperglycemia, with long-term current use of insulin (HCC) Patient with history of labile diabetes and DKA's. -Please see above for management  AKI (acute kidney injury) (HCC) Creatinine at 2.54 with baseline appears to be around 1.7 with history of CKD stage IIIa.  Started improving, currently at 1.94 -Getting IV fluid -Monitor renal function -Avoid nephrotoxins  Hypertension Patient with hydralazine and midodrine all listed in his chart.  Blood pressure pressure currently on lower normal. -Holding all antihypertensives and also awaiting med rec to be completed. -Can restart midodrine if needed -Continue to monitor  Paroxysmal atrial fibrillation (HCC) Currently rate in 90s. -Continuing home Eliquis  History of intraventricular hemorrhage following syncopal event 02/15/2023 History of intraventricular hemorrhage with syncopal episode in November 2024.  CT head today without any acute abnormality -Eliquis was restarted during prior hospitalization as hemorrhage was resolved. -  Dysautonomia orthostatic hypotension with recurrent syncope Resulted in frequent falls. -PT and OT evaluation -Positive orthostatic vitals -Use lower extremity compression stocking and abdominal binder  Hyperlipidemia -Continue statin  Iron deficiency anemia Hemoglobin decreased to 7.2 this morning, no obvious bleeding.  Likely some dilutional effect -Continuing iron supplement -Monitor hemoglobin and transfuse if below 7  Multiple myeloma (HCC) -Outpatient follow-up with oncology  Seizure disorder (HCC) History of myoclonus. -Continue Keppra   Subjective: Patient was not feeling well but unable to explain any symptoms.  Appears lethargic.  No pain, nausea or vomiting.  Appetite remained poor  Physical Exam: Vitals:   03/26/23 2118 03/27/23 0425 03/27/23 0500 03/27/23 0747  BP: 137/81  120/81  102/64  Pulse: 71 (!) 102  98  Resp: 20 16  18   Temp: (!) 97.5 F (36.4 C) 98.1 F (36.7 C)  98 F (36.7 C)  TempSrc: Oral Oral    SpO2: 100% 100%  100%  Weight:   62.3 kg   Height:       General.  Frail and malnourished elderly man, in no acute distress. Pulmonary.  Lungs clear bilaterally, normal respiratory effort. CV.  Regular rate and rhythm, no JVD, rub or murmur. Abdomen.  Soft, nontender, nondistended, BS positive. CNS.  Alert and oriented .  No focal neurologic deficit. Extremities.  No edema, no cyanosis, pulses intact and symmetrical.   Data Reviewed: Prior data reviewed  Family Communication:   Disposition: Status is: Inpatient Remains inpatient appropriate because:   Planned Discharge Destination: Skilled nursing facility  DVT prophylaxis.  Eliquis Time spent: 50 minutes  This record has been created using Conservation officer, historic buildings. Errors have been sought and corrected,but may not always be located. Such creation errors do not reflect on the standard of care.   Author: Arnetha Courser, MD 03/27/2023 2:16 PM  For on call review [email or web address].

## 2023-03-27 NOTE — Plan of Care (Signed)
  Problem: Clinical Measurements: Goal: Diagnostic test results will improve Outcome: Progressing   Problem: Nutritional: Goal: Maintenance of adequate nutrition will improve Outcome: Not Progressing   Problem: Education: Goal: Ability to describe self-care measures that may prevent or decrease complications (Diabetes Survival Skills Education) will improve Outcome: Not Progressing

## 2023-03-27 NOTE — Progress Notes (Signed)
Hopsitalist notified of glucose of 487 - rechecked per hospitalist in an hour due to giving OJ earlier from glucose of 72. Now 510. Hospitalist informed and putting in insulin orders.

## 2023-03-27 NOTE — Evaluation (Signed)
Physical Therapy Evaluation Patient Details Name: Nicholas Weiss MRN: [id number] DOB: 1951-03-26 Today's Date: 03/27/2023  History of Present Illness  72 y/o male presented to ED on 03/26/23 from [facility] for fall and AMS. Recent hospitalization 12/9-12/20 for DKA and 11/18-11/27 for IVH from syncopal episode. Admitted for acute metabolic encephalopathy. PMH: HTN, CVA, T1DM, multiple myeloma, CKD 3, Afib on Eliquis, cocaine abuse, orthostatic hypotension and recurrent syncope  Clinical Impression  Patient admitted with the above. Recently d/c'ed to SNF where he has been receiving rehab and partner reports he has had at least 1 fall per day since being there due to BP drop and patient attempting to go to bathroom. Patient continues to present with drop in BP with positional changes, see below. Required minA for bed mobility and sit to stand transfer this date. Partner present and supportive throughout. Returned to supine at end of session with NT present. Patient will benefit from skilled PT services during acute stay to address listed deficits. Patient will benefit from ongoing therapy at discharge to maximize functional independence and safety.   Orthostatic BPs  Supine 124/79  Sitting 106/61  Standing 81/60          If plan is discharge home, recommend the following: A little help with walking and/or transfers;Assistance with cooking/housework;Direct supervision/assist for medications management;Direct supervision/assist for financial management;Assist for transportation;Help with stairs or ramp for entrance   Can travel by private vehicle   No    Equipment Recommendations Other (comment) (Defer to next level of care.)  Recommendations for Other Services       Functional Status Assessment Patient has had a recent decline in their functional status and demonstrates the ability to make significant improvements in function in a reasonable and predictable amount of time.      Precautions / Restrictions Precautions Precautions: Fall Precaution Comments: watch orthostatics, abdominal binder in room (not present at eval) Restrictions Weight Bearing Restrictions Per Provider Order: No      Mobility  Bed Mobility Overal bed mobility: Needs Assistance Bed Mobility: Supine to Sit, Sit to Supine     Supine to sit: Min assist Sit to supine: Min assist        Transfers Overall transfer level: Needs assistance Equipment used: 2 person hand held assist Transfers: Sit to/from Stand Sit to Stand: Min assist           General transfer comment: assist to stand at EOB and maintained with CGA during BP reading    Ambulation/Gait               General Gait Details: deferred due to drop in BP upon standing and symptoms  Stairs            Wheelchair Mobility     Tilt Bed    Modified Rankin (Stroke Patients Only)       Balance                                             Pertinent Vitals/Pain Pain Assessment Pain Assessment: No/denies pain    Home Living Family/patient expects to be discharged to:: Skilled nursing facility                        Prior Function Prior Level of Function : Needs assist  Mobility Comments: at SNF, patient using RW but with at least 1 fall per day at SNF due to BP and patient trying to get up to go to bathroom ADLs Comments: unsure     Extremity/Trunk Assessment   Upper Extremity Assessment Upper Extremity Assessment: Defer to OT evaluation    Lower Extremity Assessment Lower Extremity Assessment: Generalized weakness    Cervical / Trunk Assessment Cervical / Trunk Assessment: Normal  Communication   Communication Communication: Difficulty following commands/understanding;Difficulty communicating thoughts/reduced clarity of speech Following commands: Follows one step commands with increased time  Cognition Arousal: Alert Behavior During  Therapy: WFL for tasks assessed/performed Overall Cognitive Status: History of cognitive impairments - at baseline                                          General Comments      Exercises     Assessment/Plan    PT Assessment Patient needs continued PT services  PT Problem List Decreased strength;Decreased activity tolerance;Decreased balance;Decreased mobility;Decreased cognition;Decreased safety awareness;Decreased coordination       PT Treatment Interventions DME instruction;Gait training;Functional mobility training;Therapeutic activities;Patient/family education;Balance training;Therapeutic exercise;Cognitive remediation    PT Goals (Current goals can be found in the Care Plan section)  Acute Rehab PT Goals Patient Stated Goal: none stated PT Goal Formulation: With patient/family Time For Goal Achievement: 04/10/23 Potential to Achieve Goals: Fair    Frequency Min 1X/week     Co-evaluation               AM-PAC PT "6 Clicks" Mobility  Outcome Measure Help needed turning from your back to your side while in a flat bed without using bedrails?: A Little Help needed moving from lying on your back to sitting on the side of a flat bed without using bedrails?: A Little Help needed moving to and from a bed to a chair (including a wheelchair)?: A Little Help needed standing up from a chair using your arms (e.g., wheelchair or bedside chair)?: A Lot Help needed to walk in hospital room?: A Lot Help needed climbing 3-5 steps with a railing? : A Lot 6 Click Score: 15    End of Session   Activity Tolerance: Treatment limited secondary to medical complications (Comment) Patient left: in bed;with call bell/phone within reach;with family/visitor present (on bedpan) Nurse Communication: Mobility status (BP readings) PT Visit Diagnosis: Unsteadiness on feet (R26.81);Muscle weakness (generalized) (M62.81);Other abnormalities of gait and mobility (R26.89)     Time: 4098-1191 PT Time Calculation (min) (ACUTE ONLY): 20 min   Charges:   PT Evaluation $PT Eval Moderate Complexity: 1 Mod   PT General Charges $$ ACUTE PT VISIT: 1 Visit         Maylon Peppers, PT, DPT Physical Therapist - [facility]  [facility]   Makayia Duplessis A Jayon Matton 03/27/2023, 3:31 PM

## 2023-03-27 NOTE — Progress Notes (Signed)
Received orders for 20 units insulin for [facility] - Charge and day shift RN are aware and witnessed this RN give 20 units sq. Day shift aware to recheck in about an hour.

## 2023-03-28 DIAGNOSIS — N179 Acute kidney failure, unspecified: Secondary | ICD-10-CM | POA: Diagnosis not present

## 2023-03-28 DIAGNOSIS — G9341 Metabolic encephalopathy: Secondary | ICD-10-CM | POA: Diagnosis not present

## 2023-03-28 DIAGNOSIS — R6511 Systemic inflammatory response syndrome (SIRS) of non-infectious origin with acute organ dysfunction: Secondary | ICD-10-CM | POA: Diagnosis not present

## 2023-03-28 DIAGNOSIS — Z515 Encounter for palliative care: Secondary | ICD-10-CM | POA: Diagnosis not present

## 2023-03-28 DIAGNOSIS — E1100 Type 2 diabetes mellitus with hyperosmolarity without nonketotic hyperglycemic-hyperosmolar coma (NKHHC): Secondary | ICD-10-CM | POA: Diagnosis not present

## 2023-03-28 LAB — URINE CULTURE: Culture: <10000 — AB

## 2023-03-28 LAB — RENAL FUNCTION PANEL
Albumin: 2.4 g/dL — ABNORMAL LOW (ref 3.5–5.0)
Anion gap: 6 (ref 5–15)
BUN: 40 mg/dL — ABNORMAL HIGH (ref 8–23)
CO2: 26 mmol/L (ref 22–32)
Calcium: 8.1 mg/dL — ABNORMAL LOW (ref 8.9–10.3)
Chloride: 109 mmol/L (ref 98–111)
Creatinine, Ser: 1.43 mg/dL — ABNORMAL HIGH (ref 0.61–1.24)
GFR, Estimated: 53 mL/min — ABNORMAL LOW (ref >60)
Glucose, Bld: 46 mg/dL — ABNORMAL LOW (ref 70–99)
Phosphorus: 2.5 mg/dL (ref 2.5–4.6)
Potassium: 4.1 mmol/L (ref 3.5–5.1)
Sodium: 141 mmol/L (ref 135–145)

## 2023-03-28 LAB — CBC
HCT: 20.8 % — ABNORMAL LOW (ref 39.0–52.0)
HCT: 24 % — ABNORMAL LOW (ref 39.0–52.0)
Hemoglobin: 6.7 g/dL — ABNORMAL LOW (ref 13.0–17.0)
Hemoglobin: 8.1 g/dL — ABNORMAL LOW (ref 13.0–17.0)
MCH: 31.2 pg (ref 26.0–34.0)
MCH: 31 pg (ref 26.0–34.0)
MCHC: 32.2 g/dL (ref 30.0–36.0)
MCHC: 33.8 g/dL (ref 30.0–36.0)
MCV: 96.7 fL (ref 80.0–100.0)
MCV: 92 fL (ref 80.0–100.0)
Platelets: 205 10*3/uL (ref 150–400)
Platelets: 200 10*3/uL (ref 150–400)
RBC: 2.15 MIL/uL — ABNORMAL LOW (ref 4.22–5.81)
RBC: 2.61 MIL/uL — ABNORMAL LOW (ref 4.22–5.81)
RDW: 15.4 % (ref 11.5–15.5)
RDW: 16.5 % — ABNORMAL HIGH (ref 11.5–15.5)
WBC: 3 10*3/uL — ABNORMAL LOW (ref 4.0–10.5)
WBC: 3.1 10*3/uL — ABNORMAL LOW (ref 4.0–10.5)
nRBC: 0 % (ref 0.0–0.2)
nRBC: 0 % (ref 0.0–0.2)

## 2023-03-28 LAB — GLUCOSE, CAPILLARY
Glucose-Capillary: 40 mg/dL — CL (ref 70–99)
Glucose-Capillary: 43 mg/dL — CL (ref 70–99)
Glucose-Capillary: 144 mg/dL — ABNORMAL HIGH (ref 70–99)
Glucose-Capillary: 55 mg/dL — ABNORMAL LOW (ref 70–99)
Glucose-Capillary: 225 mg/dL — ABNORMAL HIGH (ref 70–99)
Glucose-Capillary: 130 mg/dL — ABNORMAL HIGH (ref 70–99)
Glucose-Capillary: 210 mg/dL — ABNORMAL HIGH (ref 70–99)

## 2023-03-28 LAB — PREPARE RBC (CROSSMATCH)

## 2023-03-28 MED ORDER — HYDRALAZINE HCL 25 MG PO TABS: 25.0000 mg | ORAL_TABLET | Freq: Three times a day (TID) | ORAL | Status: DC | PRN

## 2023-03-28 MED ORDER — SODIUM CHLORIDE 0.9% IV SOLUTION
Freq: Once | INTRAVENOUS | Status: AC
Start: 2023-03-28 — End: 2023-03-28

## 2023-03-28 NOTE — Plan of Care (Signed)
Problem: Education: Goal: Ability to describe self-care measures that may prevent or decrease complications (Diabetes Survival Skills Education) will improve Outcome: Progressing Goal: Individualized Educational Video(s) Outcome: Progressing   Problem: Coping: Goal: Ability to adjust to condition or change in health will improve Outcome: Progressing   Problem: Fluid Volume: Goal: Ability to maintain a balanced intake and output will improve Outcome: Progressing   Problem: Health Behavior/Discharge Planning: Goal: Ability to identify and utilize available resources and services will improve Outcome: Progressing Goal: Ability to manage health-related needs will improve Outcome: Progressing   Problem: Metabolic: Goal: Ability to maintain appropriate glucose levels will improve Outcome: Progressing   Problem: Nutritional: Goal: Maintenance of adequate nutrition will improve Outcome: Progressing Goal: Progress toward achieving an optimal weight will improve Outcome: Progressing   Problem: Skin Integrity: Goal: Risk for impaired skin integrity will decrease Outcome: Progressing   Problem: Tissue Perfusion: Goal: Adequacy of tissue perfusion will improve Outcome: Progressing   Problem: Education: Goal: Ability to describe self-care measures that may prevent or decrease complications (Diabetes Survival Skills Education) will improve Outcome: Progressing Goal: Individualized Educational Video(s) Outcome: Progressing   Problem: Cardiac: Goal: Ability to maintain an adequate cardiac output will improve Outcome: Progressing   Problem: Health Behavior/Discharge Planning: Goal: Ability to identify and utilize available resources and services will improve Outcome: Progressing Goal: Ability to manage health-related needs will improve Outcome: Progressing   Problem: Fluid Volume: Goal: Ability to achieve a balanced intake and output will improve Outcome: Progressing    Problem: Metabolic: Goal: Ability to maintain appropriate glucose levels will improve Outcome: Progressing   Problem: Nutritional: Goal: Maintenance of adequate nutrition will improve Outcome: Progressing Goal: Maintenance of adequate weight for body size and type will improve Outcome: Progressing   Problem: Respiratory: Goal: Will regain and/or maintain adequate ventilation Outcome: Progressing   Problem: Urinary Elimination: Goal: Ability to achieve and maintain adequate renal perfusion and functioning will improve Outcome: Progressing   Problem: Education: Goal: Knowledge of General Education information will improve Description: Including pain rating scale, medication(s)/side effects and non-pharmacologic comfort measures Outcome: Progressing   Problem: Health Behavior/Discharge Planning: Goal: Ability to manage health-related needs will improve Outcome: Progressing   Problem: Clinical Measurements: Goal: Ability to maintain clinical measurements within normal limits will improve Outcome: Progressing Goal: Will remain free from infection Outcome: Progressing Goal: Diagnostic test results will improve Outcome: Progressing Goal: Respiratory complications will improve Outcome: Progressing Goal: Cardiovascular complication will be avoided Outcome: Progressing   Problem: Activity: Goal: Risk for activity intolerance will decrease Outcome: Progressing   Problem: Nutrition: Goal: Adequate nutrition will be maintained Outcome: Progressing   Problem: Coping: Goal: Level of anxiety will decrease Outcome: Progressing   Problem: Elimination: Goal: Will not experience complications related to bowel motility Outcome: Progressing Goal: Will not experience complications related to urinary retention Outcome: Progressing   Problem: Pain Management: Goal: General experience of comfort will improve Outcome: Progressing   Problem: Safety: Goal: Ability to remain free  from injury will improve Outcome: Progressing   Problem: Skin Integrity: Goal: Risk for impaired skin integrity will decrease Outcome: Progressing   Problem: Education: Goal: Knowledge of disease or condition will improve Outcome: Progressing Goal: Understanding of medication regimen will improve Outcome: Progressing Goal: Individualized Educational Video(s) Outcome: Progressing   Problem: Activity: Goal: Ability to tolerate increased activity will improve Outcome: Progressing   Problem: Cardiac: Goal: Ability to achieve and maintain adequate cardiopulmonary perfusion will improve Outcome: Progressing   Problem: Health Behavior/Discharge Planning: Goal: Ability to safely  manage health-related needs after discharge will improve Outcome: Progressing

## 2023-03-28 NOTE — Plan of Care (Signed)
  Problem: Education: Goal: Ability to describe self-care measures that may prevent or decrease complications (Diabetes Survival Skills Education) will improve Outcome: Progressing Goal: Individualized Educational Video(s) Outcome: Progressing   Problem: Coping: Goal: Ability to adjust to condition or change in health will improve Outcome: Progressing   

## 2023-03-28 NOTE — Progress Notes (Signed)
Progress Note   Patient: Nicholas Weiss DOB: 1950-11-12 DOA: 03/26/2023     2 DOS: the patient was seen and examined on 03/28/2023   Brief hospital course:  Nicholas Weiss is a 72 y.o. male with medical history significant of  hypertension, prior CVA, type 1 diabetes with history of frequent DKA, multiple myeloma, CKD, paroxysmal atrial fibrillation on Eliquis, history of myoclonus on Keppra, orthostatic hypotension and recurrent syncope, frequent hospitalizations, was sent to ED from his facility with complaint of altered mental status and frequent falls.   Patient was complaining of burning with urination since yesterday.  Had 3 falls today with generalized weakness and overall not feeling well.  Poor p.o. intake.  No fever or chills.  No nausea, vomiting or diarrhea.  No upper respiratory symptoms.  Patient with history of frequent hospitalization most recent was from 12/9 till 20 when he was admitted for DKA.  History of labile diabetes and autonomic dysfunction.    On presentation patient had mild tachycardia and tachypnea, afebrile, labs with WBC of 3.6, hemoglobin 8.3, pseudohyponatremia secondary to hyperglycemia and blood glucose of 665, bicarb 18, BUN 58, creatinine 2.54, lactic acid 3.1 which increased to 4.5 on subsequent check.  Respiratory panel negative for COVID, influenza and RSV.  Troponin 14. Pan scan which include chest x-ray with some right eighth and ninth rib fracture, CT head, cervical spine, CT chest, abdomen and pelvis are all negative for any other acute abnormality.   Patient was given IV fluid and a dose of cefepime, vancomycin and Flagyl.  Blood cultures were drawn.  Unfortunately UA was not done despite being ordered before getting antibiotics.  Endo tool with insulin infusion was initially ordered but his blood glucose significantly improved with IV fluid so it was discontinued and he was started on basal and short-acting.  12/28: Vital stable  labs with hyperkalemia at 5.2, pseudohyponatremia with hyperglycemia, blood glucose above 400, hemoglobin dropped to 7.2, no obvious bleeding.  Lactic acidosis resolved, preliminary blood cultures negative, UA with leukocytes and bacteria but it was obtained after getting broad-spectrum antibiotics, cultures pending, giving some extra fluid and insulin and will continue ceftriaxone for now.  12/29: Vital remains stable, quite labile blood glucose level with extreme variations.  Hemoglobin decreased to 6.7, no obvious bleeding.  Giving 1 unit of PRBC.  Renal functions continue to improve.  PT and OT are recommending SNF.   Assessment and Plan: * Acute metabolic encephalopathy Likely multifactorial with concern of sepsis but currently no source of infection and hyperosmolar hyperglycemia. Frequent hospitalizations.  At baseline now. -Palliative care consult -Continue to monitor -Treat underlying conditions  Systemic inflammatory response syndrome (SIRS) associated with organ dysfunction [facility]) Patient met SIRS response criteria with tachycardia, tachypnea, no leukocytosis but did had lactic acidosis and AKI, Lactic acidosis has resolved.  Creatinine at 1.94 All imaging negative for any obvious source of infection. UA was ordered but never done and patient already received broad-spectrum antibiotics in ED. blood cultures ordered and pending.  UA which was obtained after antibiotics with moderate leukocytosis and mild microscopic hematuria, pending urine culture Respiratory panel negative for COVID, flu and are RSV -If source of infection found then patient met sepsis criteria -Continue with Rocephin for now  Hyperosmolar hyperglycemic state (HHS) (HCC) Patient with significantly elevated blood glucose above 600, nonanion gap metabolic acidosis, likely HHS.  Increase serum osmolality.  History of labile diabetes and multiple DKA's. -Initially Endo tool orders were placed but his CBG significantly  improved with fluid resuscitation so that was discontinued. Blood sugar remains very labile with extreme variation -Continue Smeglee -Continue with SSI -Patient will get benefit from continuous glucose monitoring and outpatient endocrinology follow-up  Uncontrolled type 1 diabetes mellitus with hyperglycemia, with long-term current use of insulin (HCC) Patient with history of labile diabetes and DKA's. -Please see above for management  AKI (acute kidney injury) (HCC) Creatinine at 2.54 with baseline appears to be around 1.7 with history of CKD stage IIIa.  Creatinine improved to 1.43 today -Encourage p.o. hydration -Monitor renal function -Avoid nephrotoxins  Hypertension Patient with hydralazine and midodrine all listed in his chart.  Blood pressure pressure currently on lower normal. Blood pressure now elevated -Restarting home hydralazine -Continue to monitor  Paroxysmal atrial fibrillation (HCC) Currently rate in 90s. -Continuing home Eliquis  History of intraventricular hemorrhage following syncopal event 02/15/2023 History of intraventricular hemorrhage with syncopal episode in November 2024.  CT head today without any acute abnormality -Eliquis was restarted during prior hospitalization as hemorrhage was resolved. -  Dysautonomia orthostatic hypotension with recurrent syncope Resulted in frequent falls. -PT and OT evaluation -Positive orthostatic vitals -Use lower extremity compression stocking and abdominal binder  Hyperlipidemia -Continue statin  Iron deficiency anemia Further decrease of hemoglobin to 6.7 this morning, no obvious bleeding.   -Ordered 1 unit of PRBC -Continuing iron supplement -Monitor hemoglobin and transfuse if below 7  Multiple myeloma (HCC) -Outpatient follow-up with oncology  Seizure disorder (HCC) History of myoclonus. -Continue Keppra   Subjective: Patient was seen and examined today.  No new concern.  Physical Exam: Vitals:    03/27/23 0747 03/27/23 2039 03/28/23 0902 03/28/23 1445  BP: 102/64 (!) 146/89 (!) 149/89 (!) 156/88  Pulse: 98 73 74 76  Resp: 18 18 18    Temp: 98 F (36.7 C) 98.9 F (37.2 C) 98 F (36.7 C) 97.8 F (36.6 C)  TempSrc:    Oral  SpO2: 100% 100% 100% 100%  Weight:      Height:       General.  Frail elderly man, in no acute distress. Pulmonary.  Lungs clear bilaterally, normal respiratory effort. CV.  Regular rate and rhythm, no JVD, rub or murmur. Abdomen.  Soft, nontender, nondistended, BS positive. CNS.  Alert and oriented .  No focal neurologic deficit. Extremities.  No edema, no cyanosis, pulses intact and symmetrical.   Data Reviewed: Prior data reviewed  Family Communication:   Disposition: Status is: Inpatient Remains inpatient appropriate because:   Planned Discharge Destination: Skilled nursing facility  DVT prophylaxis.  Eliquis Time spent: 50 minutes  This record has been created using Conservation officer, historic buildings. Errors have been sought and corrected,but may not always be located. Such creation errors do not reflect on the standard of care.   Author: Arnetha Courser, MD 03/28/2023 3:13 PM  For on call review [email or web address].

## 2023-03-28 NOTE — Assessment & Plan Note (Signed)
Patient with history of labile diabetes and DKA's. -Please see above for management

## 2023-03-28 NOTE — Evaluation (Signed)
Speech Language Pathology Evaluation Patient Details Name: Nicholas Weiss MRN: [id number] DOB: 1950-09-09 Today's Date: 03/28/2023 Time: 4098-1191 SLP Time Calculation (min) (ACUTE ONLY): 10 min  Problem List:  Patient Active Problem List   Diagnosis Date Noted   Hyperosmolar hyperglycemic state (HHS) (HCC) 03/26/2023   Iron deficiency anemia 03/26/2023   Seizure disorder (HCC) 03/26/2023   Systemic inflammatory response syndrome (SIRS) associated with organ dysfunction (HCC) 03/26/2023   NSTEMI (non-ST elevated myocardial infarction) (HCC) 03/09/2023   Elevated troponin 03/08/2023   Paroxysmal atrial fibrillation (HCC) 03/08/2023   Chronic anticoagulation 03/08/2023   ICH (intracerebral hemorrhage) (HCC) 02/16/2023   Uncontrolled diabetes mellitus with hyperglycemia (HCC) 02/16/2023   GERD without esophagitis 02/16/2023   Hypertension 02/16/2023   Diabetic neuropathy (HCC) 02/16/2023   Intraventricular hemorrhage (HCC) 02/16/2023   Myoclonus 01/15/2023   Hypothermia 12/29/2022   Dizziness 11/13/2022   Substance abuse (HCC) 09/18/2022   History of GI bleed 09/18/2022   History of intraventricular hemorrhage following syncopal event 02/15/2023 09/18/2022   Uncontrolled type 1 diabetes mellitus with hyperglycemia, with long-term current use of insulin (HCC) 08/26/2022   Postural dizziness with presyncope 08/18/2022   Near syncope 08/18/2022   Closed fracture of shaft of left humerus 06/18/2022   Malnutrition of moderate degree 06/10/2022   Hypotension 06/10/2022   GI bleed 06/06/2022   Subdural hematoma (HCC) 05/15/2022   Falls 05/15/2022   Shoulder pain, left 05/15/2022   History of amputation of left great toe (HCC) 05/04/2022   Hypophosphatemia 04/28/2022   Protein-calorie malnutrition, severe 04/24/2022   Diabetic ulcer of toe associated with diabetes mellitus due to underlying condition, with fat layer exposed (HCC) 04/24/2022   Streptococcal bacteremia 04/24/2022    Leukopenia 04/24/2022   History of multiple myeloma 04/24/2022   Dysautonomia orthostatic hypotension with recurrent syncope 04/24/2022   Weight loss 04/23/2022   Osteomyelitis of left foot (HCC) 04/22/2022   DKA, type 1 (HCC) 04/22/2022   Dehydration 04/08/2022   Diabetic ketoacidosis without coma associated with type 1 diabetes mellitus (HCC) 04/07/2022   Hyperkalemia 04/07/2022   Traumatic pneumothorax 02/18/2022   Multiple fractures of ribs, left side, initial encounter for closed fracture 02/18/2022   Pneumothorax, closed, traumatic 02/18/2022   Benign prostatic hyperplasia with nocturia 02/12/2022   Stroke (cerebrum) (HCC) 11/07/2021   Multiple myeloma (HCC) 09/29/2021   Multiple myeloma not having achieved remission (HCC) 09/29/2021   Hyponatremia 08/18/2021   COVID-19 virus infection 08/13/2021   History of CVA (cerebrovascular accident) 08/13/2021   Syncope and collapse, recurrent 08/09/2021   Hepatic lesion 08/05/2021   Dyslipidemia 05/08/2021   History of substance abuse (HCC) 12/09/2020   Acute metabolic encephalopathy 12/02/2020   Cocaine abuse (HCC) 11/30/2020   Symptomatic anemia 11/08/2020   Muscle twitching 09/12/2020   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   Pre-syncope 08/01/2020   Heart block AV first degree 08/01/2020   Anemia of chronic kidney failure, stage 3 (moderate) (HCC) 05/28/2020   PUD (peptic ulcer disease)    Esophageal dysphagia    Encounter for screening colonoscopy    Hyperglycemia 07/28/2019   Generalized weakness 07/28/2019   Hypoglycemia 04/27/2019   Unresponsiveness 04/27/2019   Lung nodule 04/07/2019   Type 1 diabetes mellitus (HCC)    Acute renal failure superimposed on stage 3a chronic kidney disease (HCC)    Hypertensive urgency 04/05/2019   AKI (acute kidney injury) (HCC) 04/05/2019   CKD (chronic kidney disease) stage 3, GFR 30-59 ml/min (HCC) 04/05/2019   Hyperlipidemia 01/24/2016   Essential (primary) hypertension  06/07/2006    Type 1 diabetes mellitus with diabetic neuropathy, unspecified (HCC) 05/31/2006   Past Medical History:  Past Medical History:  Diagnosis Date   Acute metabolic encephalopathy 12/02/2020   DKA (diabetic ketoacidoses) 04/06/2016   Hypercholesteremia    Hypertension    Past Surgical History:  Past Surgical History:  Procedure Laterality Date   AMPUTATION TOE Left 04/25/2022   Procedure: LEFT GREAT TOE AMPUTATION AND LEFT PARTIAL 2ND TOE AMPUTATION;  Surgeon: Felecia Shelling, DPM;  Location: [facility] ORS;  Service: Podiatry;  Laterality: Left;   COLONOSCOPY WITH PROPOFOL N/A 02/01/2020   Procedure: COLONOSCOPY WITH PROPOFOL;  Surgeon: Toney Reil, MD;  Location: [facility] ENDOSCOPY;  Service: Gastroenterology;  Laterality: N/A;   ESOPHAGOGASTRODUODENOSCOPY  02/01/2020   Procedure: ESOPHAGOGASTRODUODENOSCOPY (EGD);  Surgeon: Toney Reil, MD;  Location: [facility] ENDOSCOPY;  Service: Gastroenterology;;   ESOPHAGOGASTRODUODENOSCOPY (EGD) WITH PROPOFOL N/A 06/07/2022   Procedure: ESOPHAGOGASTRODUODENOSCOPY (EGD) WITH PROPOFOL;  Surgeon: Kerin Salen, MD;  Location: [facility] ENDOSCOPY;  Service: Gastroenterology;  Laterality: N/A;   KNEE SURGERY Right    Torn meniscus   KNEE SURGERY Left    HPI:  72 y/o male presented to ED on 03/26/23 from [facility] for fall and AMS. Recent hospitalization 12/9-12/20 for DKA and 11/18-11/27 for IVH from syncopal episode. Admitted for acute metabolic encephalopathy. PMH: HTN, CVA, T1DM, multiple myeloma, CKD 3, Afib on Eliquis, cocaine abuse, orthostatic hypotension and recurrent syncope   Assessment / Plan / Recommendation Clinical Impression  Pt seen for cognitive-linguistic evaluation. Assessment completed via informal means. Pt alert, slow to respond at times. Oriented to self only. Restless. Flat affect. Poor insight/awareness, "I need my clothes. I'm leaving today."Tangential speech. Anomia noted conversationally. Unable to ID/demo use of call bell.  Poor attention to task which is likely impacted all aspects of cognitive-communication. Pt with cognitive-linguistic deficits affecting orientation, attention, memory, problem solving, and insight. Given dx of acute metabolic encephalopathy, recommend further cognitive-linguistic evaluation at next level of care pending improvement in medical status.    SLP Assessment  SLP Recommendation/Assessment: All further Speech Lanaguage Pathology  needs can be addressed in the next venue of care SLP Visit Diagnosis: Cognitive communication deficit (R41.841)    Recommendations for follow up therapy are one component of a multi-disciplinary discharge planning process, led by the attending physician.  Recommendations may be updated based on patient status, additional functional criteria and insurance authorization.    Follow Up Recommendations  Skilled nursing-short term rehab (<3 hours/day)    Assistance Recommended at Discharge  Frequent or constant Supervision/Assistance  Functional Status Assessment Patient has had a recent decline in their functional status and/or demonstrates limited ability to make significant improvements in function in a reasonable and predictable amount of time   Cognition  Overall Cognitive Status: No family/caregiver present to determine baseline cognitive functioning Arousal/Alertness: Awake/alert Orientation Level: Oriented to person;Disoriented to situation;Disoriented to time;Disoriented to place Attention: Sustained Sustained Attention: Impaired Sustained Attention Impairment: Verbal basic Memory: Impaired Memory Impairment:  (functional memory) Behaviors: Restless;Confabulation Safety/Judgment: Impaired       Comprehension  Auditory Comprehension Overall Auditory Comprehension: Impaired (inconsistent auditory comprehension across all levels likely due to reduced sustained attention)    Expression Expression Primary Mode of Expression: Verbal Verbal  Expression Overall Verbal Expression: Impaired Initiation: No impairment Automatic Speech: Name;Social Response Level of Generative/Spontaneous Verbalization: Phrase;Sentence Pragmatics: Impairment Impairments: Abnormal affect;Topic appropriateness Interfering Components: Attention Other Verbal Expression Comments: wordfinding difficulty appreciated   Oral / Motor  Oral Motor/Sensory Function  Overall Oral Motor/Sensory Function: Within functional limits Motor Speech Overall Motor Speech: Appears within functional limits for tasks assessed Respiration: Within functional limits Phonation: Hoarse Resonance: Within functional limits Articulation: Impaired Level of Impairment:  (minimally imprecise) Intelligibility: Intelligible           Clyde Canterbury, M.S., CCC-SLP Speech-Language Pathologist [facility] [phone] Arnette Felts)  Woodroe Chen 03/28/2023, 9:47 AM

## 2023-03-28 NOTE — Assessment & Plan Note (Signed)
Further decrease of hemoglobin to 6.7 this morning, no obvious bleeding.   -Ordered 1 unit of PRBC -Continuing iron supplement -Monitor hemoglobin and transfuse if below 7

## 2023-03-28 NOTE — Progress Notes (Signed)
Daily Progress Note   Patient Name: Nicholas Weiss       Date: 03/28/2023 DOB: [birth year]  Age: 72 y.o. MRN#: [id number] Attending Physician: Arnetha Courser, MD Primary Care Physician: Marisue Ivan, MD Admit Date: 03/26/2023  Reason for Consultation/Follow-up: Establishing goals of care  HPI/Brief Hospital Review: 72 y.o. male  with past medical history of HTN, prior CVA, type 1 diabetes with frequent episodes of DKA, multiple myeloma, CKA, PAF on Eliquis, history of myoclonus on Keppra, orthostatic hypotension with recurrent syncope admitted on 03/26/2023 with AMS and frequent falls. Per reports, patient was complaining of dysuria and suffered 3 falls day of admission with associated generalized weakness and poor PO intake.   In ED found to have tachypnea and tachycardia, initial blood glucose elevated but improved with IV fluid resuscitation, elevated lactate Respiratory panel (-) Imaging (-)   Admitted and being treated for SIRS with urine being likely source of infection-cultures pending, acute metabolic encephalopathy and HHS   Noted 5 IP admits in 6 months   Recent hospitalization 12/9-12/20 for DKA-PMT consulted during that admission    Palliative medicine was consulted for assisting with goals of care conversations  Subjective: Extensive chart review has been completed prior to meeting patient including labs, vital signs, imaging, progress notes, orders, and available advanced directive documents from current and previous encounters.    Visited with Mr. Ostling at his bedside. He is more awake and alert compared to yesterday, intermittent confusion remains. Sister in law at bedside as well as deacon from his church. Nursing staff preparing for blood transfusion, hemoglobin  dropped 6.7, concern for GI blood loss.  Therapy attempting to work at bedside, Mr. Mendel Corning able to stand on side of bed and able to take a few steps, denies complaints of dizziness, no syncope.  Sister in law places, [name]-partner on speaker phone. Provided [name] with medical updates. Answered and addressed all questions and concerns. [name] remains hopeful for ongoing recovery and allowing Mr. Alkema to participate in GOC conversations.  Thank you for allowing the Palliative Medicine Team to assist in the care of this patient.  Total time:  25 minutes  Time spent includes: Detailed review of medical records (labs, imaging, vital signs), medically appropriate exam (mental status, respiratory, cardiac, skin), discussed with treatment team, counseling and educating patient, family and staff,  documenting clinical information, medication management and coordination of care.  Leeanne Deed, DNP, AGNP-C Palliative Medicine   Please contact Palliative Medicine Team phone at [phone] for questions and concerns.

## 2023-03-28 NOTE — Assessment & Plan Note (Signed)
Likely multifactorial with concern of sepsis but currently no source of infection and hyperosmolar hyperglycemia. Frequent hospitalizations.  At baseline now. -Palliative care consult -Continue to monitor -Treat underlying conditions

## 2023-03-28 NOTE — Inpatient Diabetes Management (Signed)
Inpatient Diabetes Program Recommendations  AACE/ADA: New Consensus Statement on Inpatient Glycemic Control (2015)  Target Ranges:  Prepandial:   less than 140 mg/dL      Peak postprandial:   less than 180 mg/dL (1-2 hours)      Critically ill patients:  140 - 180 mg/dL   Lab Results  Component Value Date   GLUCAP 130 (H) 03/28/2023   HGBA1C 9.0 (H) 11/14/2022    Review of Glycemic Control  Diabetes history: DM1 Outpatient Diabetes medications: Tresiba 5 units daily, Novolog 0-9 s/s Current orders for Inpatient glycemic control: Semglee 5 BID, Novolog 0-9 TID with meals and 0-5 HS  Hypoglycemia this am - 40 - 55 mg/dL  Inpatient Diabetes Program Recommendations:    Decrease Semglee to 5 units daily  Consider Novolog 2 units TID  D/C Novolog 0-5 HS  Follow.  Thank you. Ailene Ards, RD, LDN, CDCES Inpatient Diabetes Coordinator [phone]

## 2023-03-28 NOTE — Assessment & Plan Note (Signed)
Patient with hydralazine and midodrine all listed in his chart.  Blood pressure pressure currently on lower normal. Blood pressure now elevated -Restarting home hydralazine -Continue to monitor

## 2023-03-28 NOTE — Assessment & Plan Note (Signed)
Patient met SIRS response criteria with tachycardia, tachypnea, no leukocytosis but did had lactic acidosis and AKI, Lactic acidosis has resolved.  Creatinine at 1.94 All imaging negative for any obvious source of infection. UA was ordered but never done and patient already received broad-spectrum antibiotics in ED. blood cultures ordered and pending.  UA which was obtained after antibiotics with moderate leukocytosis and mild microscopic hematuria, urine cultures with no growth but they were done after starting antibiotic.  We will treat for total of 5 days Respiratory panel negative for COVID, flu and are RSV -As no confirmed source of infection at this time-sepsis ruled out -Continue with Rocephin for now and complete a 5-day course

## 2023-03-28 NOTE — Evaluation (Signed)
Occupational Therapy Evaluation Patient Details Name: Nicholas Weiss MRN: [id number] DOB: 07-Jul-1950 Today's Date: 03/28/2023   History of Present Illness 72 y/o male presented to ED on 03/26/23 from [facility] for fall and AMS. Recent hospitalization 12/9-12/20 for DKA and 11/18-11/27 for IVH from syncopal episode. Admitted for acute metabolic encephalopathy. PMH: HTN, CVA, T1DM, multiple myeloma, CKD 3, Afib on Eliquis, cocaine abuse, orthostatic hypotension and recurrent syncope   Clinical Impression   Chart reviewed, pt greeted in bed, agreeable to OT evaluation. Pt is oriented to self and place. Increased time for processing required. Pt does not endorse dizziness with position changes however is due to get blood, therefore away from bed mobility deferred on this date. Pt presents with deficits in strength, endurance, activity tolerance, balance, cognition, affecting safe and optimal ADL completion. MIN A required for bed mobility, STS with MIN A, one lateral step up the bed with MIN A. Toileting at bed level with urinal completed with MIN A. Pt will benefit from skilled OT to address deficits and to facilitate optimal ADL performance. Pt is left in care of family, palliative NP. OT will follow acutely.       If plan is discharge home, recommend the following: Direct supervision/assist for medications management;Direct supervision/assist for financial management;Assist for transportation;Help with stairs or ramp for entrance;A lot of help with bathing/dressing/bathroom;Assistance with cooking/housework;Supervision due to cognitive status;A lot of help with walking and/or transfers    Functional Status Assessment  Patient has had a recent decline in their functional status and demonstrates the ability to make significant improvements in function in a reasonable and predictable amount of time.  Equipment Recommendations  Other (comment) (per next venue of care)    Recommendations  for Other Services       Precautions / Restrictions Precautions Precautions: Fall Precaution Comments: watch orthostatics,abdominal binder      Mobility Bed Mobility Overal bed mobility: Needs Assistance Bed Mobility: Supine to Sit, Sit to Supine     Supine to sit: Min assist, HOB elevated, Used rails Sit to supine: Min assist, HOB elevated, Used rails   General bed mobility comments: intermittent vcs for technique    Transfers Overall transfer level: Needs assistance Equipment used: 1 person hand held assist Transfers: Sit to/from Stand Sit to Stand: Min assist           General transfer comment: no c/o dizziness      Balance Overall balance assessment: Needs assistance Sitting-balance support: Feet supported, Bilateral upper extremity supported Sitting balance-Leahy Scale: Good     Standing balance support: Bilateral upper extremity supported, During functional activity, Reliant on assistive device for balance Standing balance-Leahy Scale: Poor                             ADL either performed or assessed with clinical judgement   ADL Overall ADL's : Needs assistance/impaired                 Upper Body Dressing : Minimal assistance   Lower Body Dressing: Maximal assistance;Bed level Lower Body Dressing Details (indicate cue type and reason): socks, attempted, unable to complete     Toileting- Clothing Manipulation and Hygiene: Minimal assistance Toileting - Clothing Manipulation Details (indicate cue type and reason): urinal semi supine in bed             Vision Patient Visual Report: No change from baseline       Perception  Praxis         Pertinent Vitals/Pain Pain Assessment Pain Assessment: No/denies pain     Extremity/Trunk Assessment Upper Extremity Assessment Upper Extremity Assessment: Generalized weakness   Lower Extremity Assessment Lower Extremity Assessment: Generalized weakness        Communication Communication Communication: Difficulty following commands/understanding Following commands: Follows one step commands with increased time Cueing Techniques: Verbal cues;Tactile cues   Cognition Arousal: Alert Behavior During Therapy: WFL for tasks assessed/performed Overall Cognitive Status: Impaired/Different from baseline Area of Impairment: Orientation, Attention, Memory, Following commands, Safety/judgement, Awareness                 Orientation Level: Disoriented to, Time, Situation Current Attention Level: Focused Memory: Decreased short-term memory Following Commands: Follows one step commands inconsistently, Follows one step commands with increased time Safety/Judgement: Decreased awareness of safety, Decreased awareness of deficits Awareness: Intellectual Problem Solving: Slow processing, Decreased initiation, Requires verbal cues, Requires tactile cues       General Comments       Exercises Other Exercises Other Exercises: edu re: role of OT, role of rehab   Shoulder Instructions      Home Living Family/patient expects to be discharged to:: Skilled nursing facility                                        Prior Functioning/Environment               Mobility Comments: at SNF, patient using RW but with at least 1 fall per day at SNF due to BP and patient trying to get up to go to bathroom ADLs Comments: assist for ADLs at SNF        OT Problem List: Decreased coordination;Decreased activity tolerance;Decreased safety awareness;Impaired balance (sitting and/or standing);Decreased knowledge of precautions;Decreased knowledge of use of DME or AE;Decreased strength;Decreased cognition      OT Treatment/Interventions: Self-care/ADL training;Therapeutic exercise;Patient/family education;Balance training;Therapeutic activities;Cognitive remediation/compensation    OT Goals(Current goals can be found in the care plan section)  Acute Rehab OT Goals Patient Stated Goal: improve function OT Goal Formulation: With patient Time For Goal Achievement: 04/11/23 Potential to Achieve Goals: Good ADL Goals Pt Will Perform Grooming: with supervision;sitting Pt Will Perform Lower Body Dressing: with supervision;sitting/lateral leans;sit to/from stand Pt Will Transfer to Toilet: with contact guard assist;stand pivot transfer;bedside commode Pt Will Perform Toileting - Clothing Manipulation and hygiene: sitting/lateral leans;sit to/from stand;with contact guard assist  OT Frequency: Min 1X/week    Co-evaluation              AM-PAC OT "6 Clicks" Daily Activity     Outcome Measure Help from another person eating meals?: A Little Help from another person taking care of personal grooming?: A Little Help from another person toileting, which includes using toliet, bedpan, or urinal?: A Lot Help from another person bathing (including washing, rinsing, drying)?: A Lot Help from another person to put on and taking off regular upper body clothing?: A Little Help from another person to put on and taking off regular lower body clothing?: A Lot 6 Click Score: 15   End of Session Nurse Communication: Mobility status  Activity Tolerance: Patient limited by fatigue Patient left: in bed;with call bell/phone within reach;with bed alarm set;with nursing/sitter in room;with family/visitor present  OT Visit Diagnosis: Other abnormalities of gait and mobility (R26.89);Repeated falls (R29.6);Unsteadiness on feet (R26.81)  Time: 8295-6213 OT Time Calculation (min): 17 min Charges:  OT General Charges $OT Visit: 1 Visit OT Evaluation $OT Eval Moderate Complexity: 1 Mod  Oleta Mouse, OTD OTR/L  03/28/23, 3:18 PM

## 2023-03-28 NOTE — Assessment & Plan Note (Signed)
Patient with significantly elevated blood glucose above 600, nonanion gap metabolic acidosis, likely HHS.  Increase serum osmolality.  History of labile diabetes and multiple DKA's. -Initially Endo tool orders were placed but his CBG significantly improved with fluid resuscitation so that was discontinued. Blood sugar remains very labile with extreme variation -Continue Smeglee -Add 2 units of NovoLog with meal -Continue with SSI -Patient will get benefit from continuous glucose monitoring and outpatient endocrinology follow-up

## 2023-03-28 NOTE — Assessment & Plan Note (Signed)
Creatinine at 2.54 with baseline appears to be around 1.7 with history of CKD stage IIIa.  Creatinine improved to 1.43 today -Encourage p.o. hydration -Monitor renal function -Avoid nephrotoxins

## 2023-03-28 NOTE — TOC Progression Note (Signed)
Transition of Care [facility]) - Progression Note    Patient Details  Name: Nicholas Weiss MRN: [id number] Date of Birth: 10/02/1950  [facility]) CM/SW Contact  Liliana Cline, LCSW Phone Number: 03/28/2023, 10:45 AM  Clinical Narrative:    Palliative plans to meet with patient/family this afternoon. TOC will follow up after.   Expected Discharge Plan: Skilled Nursing Facility Barriers to Discharge: Continued Medical Work up  Expected Discharge Plan and Services                                               Social Determinants of Health (SDOH) Interventions SDOH Screenings   Food Insecurity: Patient Unable To Answer (03/27/2023)  Housing: Patient Unable To Answer (03/27/2023)  Transportation Needs: Patient Unable To Answer (03/27/2023)  Utilities: Patient Unable To Answer (03/27/2023)  Alcohol Screen: Low Risk  (10/02/2020)  Depression (PHQ2-9): Medium Risk (10/02/2020)  Financial Resource Strain: Low Risk  (01/26/2023)   Received from [facility]  Physical Activity: Insufficiently Active (01/01/2020)  Social Connections: Moderately Isolated (01/01/2020)  Stress: No Stress Concern Present (01/01/2020)  Tobacco Use: Low Risk  (03/27/2023)  Recent Concern: Tobacco Use - Medium Risk (02/15/2023)   Received from [facility]    Readmission Risk Interventions    02/16/2023    3:56 PM 08/24/2022   10:57 AM 05/20/2022   11:41 AM  Readmission Risk Prevention Plan  Transportation Screening Complete Complete Complete  Medication Review Oceanographer) Complete Complete Complete  PCP or Specialist appointment within 3-5 days of discharge Complete Complete Complete  HRI or Home Care Consult Complete Complete Complete  SW Recovery Care/Counseling Consult Not Complete    Palliative Care Screening Not Applicable  Complete  Skilled Nursing Facility Complete Complete Complete

## 2023-03-29 DIAGNOSIS — Z7189 Other specified counseling: Secondary | ICD-10-CM

## 2023-03-29 DIAGNOSIS — R6511 Systemic inflammatory response syndrome (SIRS) of non-infectious origin with acute organ dysfunction: Secondary | ICD-10-CM | POA: Diagnosis not present

## 2023-03-29 DIAGNOSIS — G9341 Metabolic encephalopathy: Secondary | ICD-10-CM | POA: Diagnosis not present

## 2023-03-29 DIAGNOSIS — E1065 Type 1 diabetes mellitus with hyperglycemia: Secondary | ICD-10-CM | POA: Diagnosis not present

## 2023-03-29 DIAGNOSIS — N179 Acute kidney failure, unspecified: Secondary | ICD-10-CM | POA: Diagnosis not present

## 2023-03-29 LAB — CBC
HCT: 25 % — ABNORMAL LOW (ref 39.0–52.0)
Hemoglobin: 8.4 g/dL — ABNORMAL LOW (ref 13.0–17.0)
MCH: 30.8 pg (ref 26.0–34.0)
MCHC: 33.6 g/dL (ref 30.0–36.0)
MCV: 91.6 fL (ref 80.0–100.0)
Platelets: 209 10*3/uL (ref 150–400)
RBC: 2.73 MIL/uL — ABNORMAL LOW (ref 4.22–5.81)
RDW: 16.9 % — ABNORMAL HIGH (ref 11.5–15.5)
WBC: 3.7 10*3/uL — ABNORMAL LOW (ref 4.0–10.5)
nRBC: 0 % (ref 0.0–0.2)

## 2023-03-29 LAB — RENAL FUNCTION PANEL
Albumin: 2.7 g/dL — ABNORMAL LOW (ref 3.5–5.0)
Anion gap: 9 (ref 5–15)
BUN: 30 mg/dL — ABNORMAL HIGH (ref 8–23)
CO2: 25 mmol/L (ref 22–32)
Calcium: 8 mg/dL — ABNORMAL LOW (ref 8.9–10.3)
Chloride: 99 mmol/L (ref 98–111)
Creatinine, Ser: 1.19 mg/dL (ref 0.61–1.24)
GFR, Estimated: >60 mL/min (ref >60)
Glucose, Bld: 211 mg/dL — ABNORMAL HIGH (ref 70–99)
Phosphorus: 2.7 mg/dL (ref 2.5–4.6)
Potassium: 4.7 mmol/L (ref 3.5–5.1)
Sodium: 137 mmol/L (ref 135–145)

## 2023-03-29 LAB — GLUCOSE, CAPILLARY
Glucose-Capillary: 284 mg/dL — ABNORMAL HIGH (ref 70–99)
Glucose-Capillary: 210 mg/dL — ABNORMAL HIGH (ref 70–99)
Glucose-Capillary: 27 mg/dL — CL (ref 70–99)
Glucose-Capillary: 158 mg/dL — ABNORMAL HIGH (ref 70–99)
Glucose-Capillary: 288 mg/dL — ABNORMAL HIGH (ref 70–99)

## 2023-03-29 LAB — HEMOGLOBIN A1C
Hgb A1c MFr Bld: 10.7 % — ABNORMAL HIGH (ref 4.8–5.6)
Mean Plasma Glucose: 260 mg/dL

## 2023-03-29 MED ORDER — INSULIN ASPART 100 UNIT/ML IJ SOLN
2.0000 [IU] | Freq: Three times a day (TID) | INTRAMUSCULAR | Status: DC
  Administered 2023-03-30: 2 [IU] via SUBCUTANEOUS
  Filled 2023-03-29 (×2): qty 1

## 2023-03-29 NOTE — TOC Progression Note (Addendum)
Transition of Care [facility]) - Progression Note    Patient Details  Name: KEYSHON FONGER MRN: [id number] Date of Birth: 03-Feb-1951  Transition of Care [facility]) CM/SW Contact  Chapman Fitch, RN Phone Number: 03/29/2023, 2:16 PM  Clinical Narrative:     Per MD anticipated patient will medically be ready for discharge tomorrow  Confirmed with patient's partner [name] that plan is to return to [facility] at discharge. If disposition changes will reach out to patients sisters   Fl2 sent for signature Tanya at [facility] notified  Marylene Land with [facility] to start auth in [place]    Expected Discharge Plan: Skilled Nursing Facility Barriers to Discharge: Continued Medical Work up  Expected Discharge Plan and Services                                               Social Determinants of Health (SDOH) Interventions SDOH Screenings   Food Insecurity: Patient Unable To Answer (03/27/2023)  Housing: Patient Unable To Answer (03/27/2023)  Transportation Needs: Patient Unable To Answer (03/27/2023)  Utilities: Patient Unable To Answer (03/27/2023)  Alcohol Screen: Low Risk  (10/02/2020)  Depression (PHQ2-9): Medium Risk (10/02/2020)  Financial Resource Strain: Low Risk  (01/26/2023)   Received from [facility]  Physical Activity: Insufficiently Active (01/01/2020)  Social Connections: Moderately Isolated (01/01/2020)  Stress: No Stress Concern Present (01/01/2020)  Tobacco Use: Low Risk  (03/27/2023)  Recent Concern: Tobacco Use - Medium Risk (02/15/2023)   Received from [facility]    Readmission Risk Interventions    02/16/2023    3:56 PM 08/24/2022   10:57 AM 05/20/2022   11:41 AM  Readmission Risk Prevention Plan  Transportation Screening Complete Complete Complete  Medication Review Oceanographer) Complete Complete Complete  PCP or Specialist appointment within 3-5 days of discharge Complete Complete Complete  HRI or  Home Care Consult Complete Complete Complete  SW Recovery Care/Counseling Consult Not Complete    Palliative Care Screening Not Applicable  Complete  Skilled Nursing Facility Complete Complete Complete

## 2023-03-29 NOTE — Progress Notes (Signed)
Daily Progress Note   Patient Name: Nicholas Weiss       Date: 03/29/2023 DOB: Jun 20, 1950  Age: 72 y.o. MRN#: [id number] Attending Physician: Arnetha Courser, MD Primary Care Physician: Marisue Ivan, MD Admit Date: 03/26/2023  Reason for Consultation/Follow-up: Establishing goals of care  Subjective: Notes and labs reviewed. In to see patient. He is resting in bed with covers pulled over his head. No family at bedside. He does not remove cover from over head. Attempted to converse. When asked if he needs anything he states "no" .  When asked if he is hungry and he said "nuh- uh". He did not otherwise answer questions.   Unable to converse with patient about GOC.   Length of Stay: 3  Current Medications: Scheduled Meds:  . apixaban  5 mg Oral BID  . insulin aspart  0-5 Units Subcutaneous QHS  . insulin aspart  0-9 Units Subcutaneous TID WC  . insulin glargine-yfgn  5 Units Subcutaneous BID  . iron polysaccharides  150 mg Oral Daily  . levETIRAcetam  500 mg Oral BID  . multivitamin with minerals  1 tablet Oral Daily  . Vitamin D (Ergocalciferol)  50,000 Units Oral Q7 days    Continuous Infusions: . cefTRIAXone (ROCEPHIN)  IV 2 g (03/28/23 2339)    PRN Meds: acetaminophen **OR** acetaminophen, hydrALAZINE, ondansetron **OR** ondansetron (ZOFRAN) IV, polyethylene glycol  Physical Exam Constitutional:      Comments: Covers pulled over head during interaction.              Vital Signs: BP (!) 153/94 (BP Location: Left Arm)   Pulse 83   Temp 98 F (36.7 C)   Resp 18   Ht 5\' 10"  (1.778 m)   Wt 64 kg   SpO2 100%   BMI 20.24 kg/m  SpO2: SpO2: 100 % O2 Device: O2 Device: Room Air O2 Flow Rate:    Intake/output summary:  Intake/Output Summary (Last 24 hours) at  03/29/2023 1056 Last data filed at 03/29/2023 0900 Gross per 24 hour  Intake 439.17 ml  Output 600 ml  Net -160.83 ml   LBM: Last BM Date :  (UTA : PT CONFUSED) Baseline Weight: Weight: 62.6 kg Most recent weight: Weight: 64 kg    Patient Active Problem List   Diagnosis Date Noted  . Hyperosmolar  hyperglycemic state (HHS) (HCC) 03/26/2023  . Iron deficiency anemia 03/26/2023  . Seizure disorder (HCC) 03/26/2023  . Systemic inflammatory response syndrome (SIRS) associated with organ dysfunction (HCC) 03/26/2023  . NSTEMI (non-ST elevated myocardial infarction) (HCC) 03/09/2023  . Elevated troponin 03/08/2023  . Paroxysmal atrial fibrillation (HCC) 03/08/2023  . Chronic anticoagulation 03/08/2023  . ICH (intracerebral hemorrhage) (HCC) 02/16/2023  . Uncontrolled diabetes mellitus with hyperglycemia (HCC) 02/16/2023  . GERD without esophagitis 02/16/2023  . Hypertension 02/16/2023  . Diabetic neuropathy (HCC) 02/16/2023  . Intraventricular hemorrhage (HCC) 02/16/2023  . Myoclonus 01/15/2023  . Hypothermia 12/29/2022  . Dizziness 11/13/2022  . Substance abuse (HCC) 09/18/2022  . History of GI bleed 09/18/2022  . History of intraventricular hemorrhage following syncopal event 02/15/2023 09/18/2022  . Uncontrolled type 1 diabetes mellitus with hyperglycemia, with long-term current use of insulin (HCC) 08/26/2022  . Postural dizziness with presyncope 08/18/2022  . Near syncope 08/18/2022  . Closed fracture of shaft of left humerus 06/18/2022  . Malnutrition of moderate degree 06/10/2022  . Hypotension 06/10/2022  . GI bleed 06/06/2022  . Subdural hematoma (HCC) 05/15/2022  . Falls 05/15/2022  . Shoulder pain, left 05/15/2022  . History of amputation of left great toe (HCC) 05/04/2022  . Hypophosphatemia 04/28/2022  . Protein-calorie malnutrition, severe 04/24/2022  . Diabetic ulcer of toe associated with diabetes mellitus due to underlying condition, with fat layer exposed  (HCC) 04/24/2022  . Streptococcal bacteremia 04/24/2022  . Leukopenia 04/24/2022  . History of multiple myeloma 04/24/2022  . Dysautonomia orthostatic hypotension with recurrent syncope 04/24/2022  . Weight loss 04/23/2022  . Osteomyelitis of left foot (HCC) 04/22/2022  . DKA, type 1 (HCC) 04/22/2022  . Dehydration 04/08/2022  . Diabetic ketoacidosis without coma associated with type 1 diabetes mellitus (HCC) 04/07/2022  . Hyperkalemia 04/07/2022  . Traumatic pneumothorax 02/18/2022  . Multiple fractures of ribs, left side, initial encounter for closed fracture 02/18/2022  . Pneumothorax, closed, traumatic 02/18/2022  . Benign prostatic hyperplasia with nocturia 02/12/2022  . Stroke (cerebrum) (HCC) 11/07/2021  . Multiple myeloma (HCC) 09/29/2021  . Multiple myeloma not having achieved remission (HCC) 09/29/2021  . Hyponatremia 08/18/2021  . COVID-19 virus infection 08/13/2021  . History of CVA (cerebrovascular accident) 08/13/2021  . Syncope and collapse, recurrent 08/09/2021  . Hepatic lesion 08/05/2021  . Dyslipidemia 05/08/2021  . History of substance abuse (HCC) 12/09/2020  . Acute metabolic encephalopathy 12/02/2020  . Cocaine abuse (HCC) 11/30/2020  . Symptomatic anemia 11/08/2020  . Muscle twitching 09/12/2020  . 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  . Pre-syncope 08/01/2020  . Heart block AV first degree 08/01/2020  . Anemia of chronic kidney failure, stage 3 (moderate) (HCC) 05/28/2020  . PUD (peptic ulcer disease)   . Esophageal dysphagia   . Encounter for screening colonoscopy   . Hyperglycemia 07/28/2019  . Generalized weakness 07/28/2019  . Hypoglycemia 04/27/2019  . Unresponsiveness 04/27/2019  . Lung nodule 04/07/2019  . Type 1 diabetes mellitus (HCC)   . Acute renal failure superimposed on stage 3a chronic kidney disease (HCC)   . Hypertensive urgency 04/05/2019  . AKI (acute kidney injury) (HCC) 04/05/2019  . CKD (chronic kidney disease) stage 3, GFR  30-59 ml/min (HCC) 04/05/2019  . Hyperlipidemia 01/24/2016  . Essential (primary) hypertension 06/07/2006  . Type 1 diabetes mellitus with diabetic neuropathy, unspecified (HCC) 05/31/2006    Palliative Care Assessment & Plan    Recommendations/Plan: Continue full code/ full scope.   Code Status:    Code Status Orders  (From  admission, onward)           Start     Ordered   03/26/23 1649  Full code  Continuous       Question:  By:  Answer:  Default: patient does not have capacity for decision making, no surrogate or prior directive available   03/26/23 1650           Code Status History     Date Active Date Inactive Code Status Order ID Comments User Context   03/09/2023 0020 03/19/2023 2147 Full Code [id number]  Andris Baumann, MD ED   02/16/2023 0201 02/24/2023 2251 Full Code [id number]  Mansy, Vernetta Honey, MD ED   12/29/2022 1210 12/30/2022 2055 Full Code [id number]  Kathrynn Running, MD ED   11/13/2022 1930 11/15/2022 1804 Full Code [id number]  Verdene Lennert, MD ED   09/18/2022 2212 09/20/2022 2045 Full Code [id number]  Andris Baumann, MD ED   08/18/2022 2259 08/26/2022 1718 Full Code [id number]  Nolberto Hanlon, MD ED   07/13/2022 1648 07/16/2022 1739 Full Code [id number]  Lucile Shutters, MD ED   07/13/2022 1646 07/13/2022 1648 Full Code [id number]  Lucile Shutters, MD ED   07/09/2022 0743 07/11/2022 1642 Full Code [id number]  Floydene Flock, MD ED   06/06/2022 1214 06/12/2022 2119 Full Code [id number]  Joseph Art, DO ED   05/15/2022 0117 05/28/2022 2102 Full Code [id number]  Gertha Calkin, MD ED   05/15/2022 0109 05/15/2022 0117 Full Code [id number]  Gertha Calkin, MD ED   04/21/2022 0206 04/29/2022 2149 Full Code [id number]  Jimmye Norman, NP ED   04/07/2022 0449 04/09/2022 0015 Full Code [id number]  Andris Baumann, MD ED   03/09/2022 2351 03/12/2022 2244 Full Code [id number]  Gertha Calkin, MD ED   02/18/2022 0134 02/25/2022 2050 Full Code [id number]  Carollee Herter, DO ED   02/18/2022  0047 02/18/2022 0134 Full Code [id number]  Carollee Herter, DO ED   12/07/2021 0047 12/09/2021 1843 Full Code [id number]  Jimmye Norman, NP ED   11/27/2021 0109 11/28/2021 2015 Full Code [id number]  Jimmye Norman, NP ED   08/09/2021 2113 08/11/2021 1742 Full Code [id number]  Gertha Calkin, MD ED   12/06/2020 1712 12/07/2020 1953 Full Code [id number]  Cox, Amy N, DO ED   11/28/2020 2235 12/04/2020 1508 Full Code [id number]  Verdia Kuba, DO ED   09/12/2020 0326 09/13/2020 1803 Full Code [id number]  Mansy, Vernetta Honey, MD ED   08/01/2020 1537 08/02/2020 1553 Full Code [id number]  Cox, Amy N, DO ED   11/06/2019 1905 11/07/2019 1955 Full Code [id number]  Briant Cedar, MD ED   07/28/2019 0306 07/29/2019 2343 Full Code [id number]  Andris Baumann, MD ED   04/27/2019 0632 04/28/2019 1739 Full Code [id number]  Mansy, Vernetta Honey, MD ED   04/05/2019 1936 04/07/2019 1950 Full Code [id number]  Andris Baumann, MD ED   04/07/2016 1032 04/08/2016 1732 Full Code [id number]  Tonye Royalty, DO Inpatient   04/07/2016 1032 04/07/2016 1032 Full Code [id number]  Hugelmeyer, Jon Gills, DO Inpatient     Thank you for allowing the Palliative Medicine Team to assist in the care of this patient.   Morton Stall, NP  Please contact Palliative Medicine Team phone at [phone] for questions and concerns.

## 2023-03-29 NOTE — Progress Notes (Signed)
Progress Note   Patient: Nicholas Weiss:[id number] DOB: 02/15/1951 DOA: 03/26/2023     3 DOS: the patient was seen and examined on 03/29/2023   Brief hospital course:  Nicholas Weiss is a 72 y.o. male with medical history significant of  hypertension, prior CVA, type 1 diabetes with history of frequent DKA, multiple myeloma, CKD, paroxysmal atrial fibrillation on Eliquis, history of myoclonus on Keppra, orthostatic hypotension and recurrent syncope, frequent hospitalizations, was sent to ED from his facility with complaint of altered mental status and frequent falls.   Patient was complaining of burning with urination since yesterday.  Had 3 falls today with generalized weakness and overall not feeling well.  Poor p.o. intake.  No fever or chills.  No nausea, vomiting or diarrhea.  No upper respiratory symptoms.  Patient with history of frequent hospitalization most recent was from 12/9 till 20 when he was admitted for DKA.  History of labile diabetes and autonomic dysfunction.    On presentation patient had mild tachycardia and tachypnea, afebrile, labs with WBC of 3.6, hemoglobin 8.3, pseudohyponatremia secondary to hyperglycemia and blood glucose of 665, bicarb 18, BUN 58, creatinine 2.54, lactic acid 3.1 which increased to 4.5 on subsequent check.  Respiratory panel negative for COVID, influenza and RSV.  Troponin 14. Pan scan which include chest x-ray with some right eighth and ninth rib fracture, CT head, cervical spine, CT chest, abdomen and pelvis are all negative for any other acute abnormality.   Patient was given IV fluid and a dose of cefepime, vancomycin and Flagyl.  Blood cultures were drawn.  Unfortunately UA was not done despite being ordered before getting antibiotics.  Endo tool with insulin infusion was initially ordered but his blood glucose significantly improved with IV fluid so it was discontinued and he was started on basal and short-acting.  12/28: Vital stable  labs with hyperkalemia at 5.2, pseudohyponatremia with hyperglycemia, blood glucose above 400, hemoglobin dropped to 7.2, no obvious bleeding.  Lactic acidosis resolved, preliminary blood cultures negative, UA with leukocytes and bacteria but it was obtained after getting broad-spectrum antibiotics, cultures pending, giving some extra fluid and insulin and will continue ceftriaxone for now.  12/29: Vital remains stable, quite labile blood glucose level with extreme variations.  Hemoglobin decreased to 6.7, no obvious bleeding.  Giving 1 unit of PRBC.  Renal functions continue to improve.  PT and OT are recommending SNF.  12/30: Mildly elevated blood pressure, hemoglobin at 8.4 s/p 1 unit of PRBC.  Renal function improved.  Urine cultures with no growth but they were done after starting antibiotics, will complete a 5-day course. CBG again elevated, adding 2 units with meal along with SSI    Assessment and Plan: * Acute metabolic encephalopathy Likely multifactorial with concern of sepsis but currently no source of infection and hyperosmolar hyperglycemia. Frequent hospitalizations.  At baseline now. -Palliative care consult -Continue to monitor -Treat underlying conditions  Systemic inflammatory response syndrome (SIRS) associated with organ dysfunction Nicholas Weiss) Patient met SIRS response criteria with tachycardia, tachypnea, no leukocytosis but did had lactic acidosis and AKI, Lactic acidosis has resolved.  Creatinine at 1.94 All imaging negative for any obvious source of infection. UA was ordered but never done and patient already received broad-spectrum antibiotics in ED. blood cultures ordered and pending.  UA which was obtained after antibiotics with moderate leukocytosis and mild microscopic hematuria, urine cultures with no growth but they were done after starting antibiotic.  We will treat for total of 5 days  Respiratory panel negative for COVID, flu and are RSV -As no confirmed source of  infection at this time-sepsis ruled out -Continue with Rocephin for now and complete a 5-day course  Hyperosmolar hyperglycemic state (HHS) (HCC) Patient with significantly elevated blood glucose above 600, nonanion gap metabolic acidosis, likely HHS.  Increase serum osmolality.  History of labile diabetes and multiple DKA's. -Initially Endo tool orders were placed but his CBG significantly improved with fluid resuscitation so that was discontinued. Blood sugar remains very labile with extreme variation -Continue Smeglee -Add 2 units of NovoLog with meal -Continue with SSI -Patient will get benefit from continuous glucose monitoring and outpatient endocrinology follow-up  Uncontrolled type 1 diabetes mellitus with hyperglycemia, with long-term current use of insulin (HCC) Patient with history of labile diabetes and DKA's. -Please see above for management  AKI (acute kidney injury) (HCC) Creatinine at 2.54 with baseline appears to be around 1.7 with history of CKD stage IIIa.  Creatinine improved to 1.43 today -Encourage p.o. hydration -Monitor renal function -Avoid nephrotoxins  Hypertension Patient with hydralazine and midodrine all listed in his chart.  Blood pressure pressure currently on lower normal. Blood pressure now elevated -Restarting home hydralazine -Continue to monitor  Paroxysmal atrial fibrillation (HCC) Currently rate in 90s. -Continuing home Eliquis  History of intraventricular hemorrhage following syncopal event 02/15/2023 History of intraventricular hemorrhage with syncopal episode in November 2024.  CT head today without any acute abnormality -Eliquis was restarted during prior hospitalization as hemorrhage was resolved. -  Dysautonomia orthostatic hypotension with recurrent syncope Resulted in frequent falls. -PT and OT evaluation -Positive orthostatic vitals -Use lower extremity compression stocking and abdominal binder  Hyperlipidemia -Continue  statin  Iron deficiency anemia Further decrease of hemoglobin to 6.7 this morning, no obvious bleeding.   -Ordered 1 unit of PRBC -Continuing iron supplement -Monitor hemoglobin and transfuse if below 7  Multiple myeloma (HCC) -Outpatient follow-up with oncology  Seizure disorder (HCC) History of myoclonus. -Continue Keppra   Subjective: Patient was seen and examined today.  No new concern.  Physical Exam: Vitals:   03/28/23 2042 03/29/23 0439 03/29/23 0452 03/29/23 0751  BP: (!) 175/95 (!) 172/94  (!) 153/94  Pulse: 77 78  83  Resp: 20 20  18   Temp: 97.8 F (36.6 C) 98.1 F (36.7 C)  98 F (36.7 C)  TempSrc:  Oral    SpO2: 100% 100%  100%  Weight:   64 kg   Height:       General.  Frail elderly man, in no acute distress. Pulmonary.  Lungs clear bilaterally, normal respiratory effort. CV.  Regular rate and rhythm, no JVD, rub or murmur. Abdomen.  Soft, nontender, nondistended, BS positive. CNS.  Alert and oriented to self.  No focal neurologic deficit. Extremities.  No edema, no cyanosis, pulses intact and symmetrical.   Data Reviewed: Prior data reviewed  Family Communication:   Disposition: Status is: Inpatient Remains inpatient appropriate because:   Planned Discharge Destination: Skilled nursing facility  DVT prophylaxis.  Eliquis Time spent: 45 minutes  This record has been created using Conservation officer, historic buildings. Errors have been sought and corrected,but may not always be located. Such creation errors do not reflect on the standard of care.   Author: Arnetha Courser, MD 03/29/2023 2:13 PM  For on call review [email or web address].

## 2023-03-29 NOTE — Inpatient Diabetes Management (Signed)
Inpatient Diabetes Program Recommendations  AACE/ADA: New Consensus Statement on Inpatient Glycemic Control (2015)  Target Ranges:  Prepandial:   less than 140 mg/dL      Peak postprandial:   less than 180 mg/dL (1-2 hours)      Critically ill patients:  140 - 180 mg/dL   Lab Results  Component Value Date   GLUCAP 210 (H) 03/29/2023   HGBA1C 10.7 (H) 03/26/2023    Diabetes history: DM1 Outpatient Diabetes medications: Tresiba 5 units daily, Novolog 0-9 s/s Current orders for Inpatient glycemic control: Semglee 5 BID, Novolog 0-9 TID with meals and 0-5 HS    Inpatient Diabetes Program Recommendations:     Please consider: -Novolog 2 units TID  Thank you, Billy Fischer. Aylissa Heinemann, RN, MSN, CDCES  Diabetes Coordinator Inpatient Glycemic Control Team Team Pager [phone] (8am-5pm) 03/29/2023 12:57 PM

## 2023-03-29 NOTE — Care Management Important Message (Signed)
Important Message  Patient Details  Name: Nicholas Weiss MRN: [id number] Date of Birth: 1951-02-04   Important Message Given:  Yes - Medicare IM     Cristela Blue, CMA 03/29/2023, 3:27 PM

## 2023-03-29 NOTE — Progress Notes (Signed)
Mobility Specialist - Progress Note   03/29/23 1200  Mobility  Activity Dangled on edge of bed;Turned to right side;Turned to left side;Turned to back - supine  Level of Assistance Contact guard assist, steadying assist  Activity Response Tolerated fair  Mobility visit 1 Mobility     Pt lying in bed upon arrival, utilizing RA. Pt agreeable to activity. Noted soiled sheets upon arrival and patient requesting immediate need for urinal. Pt achieved EOB impulsively with cues for safety; almost scoots too far off of bed but was corrected with cueing. BP checked in sitting 67/59 (64) with 98 HR. Pt reporting dizziness while sitting and requesting to return supine; limiting further activity. CGA while EOB as pt requires cues to maintain upright position; occasionally leans forward to rest forearms on knees. Pt returned supine and rolled L/R for peri-care and linen change. Pt left in bed with alarm set, needs in reach.    Filiberto Pinks Mobility Specialist 03/29/23, 12:19 PM

## 2023-03-30 DIAGNOSIS — G9341 Metabolic encephalopathy: Secondary | ICD-10-CM | POA: Diagnosis not present

## 2023-03-30 DIAGNOSIS — C9000 Multiple myeloma not having achieved remission: Secondary | ICD-10-CM

## 2023-03-30 DIAGNOSIS — N179 Acute kidney failure, unspecified: Secondary | ICD-10-CM | POA: Diagnosis not present

## 2023-03-30 DIAGNOSIS — E1040 Type 1 diabetes mellitus with diabetic neuropathy, unspecified: Secondary | ICD-10-CM | POA: Diagnosis not present

## 2023-03-30 DIAGNOSIS — I480 Paroxysmal atrial fibrillation: Secondary | ICD-10-CM | POA: Diagnosis not present

## 2023-03-30 LAB — GLUCOSE, CAPILLARY
Glucose-Capillary: 61 mg/dL — ABNORMAL LOW (ref 70–99)
Glucose-Capillary: 125 mg/dL — ABNORMAL HIGH (ref 70–99)
Glucose-Capillary: 54 mg/dL — ABNORMAL LOW (ref 70–99)
Glucose-Capillary: 52 mg/dL — ABNORMAL LOW (ref 70–99)
Glucose-Capillary: 71 mg/dL (ref 70–99)
Glucose-Capillary: 119 mg/dL — ABNORMAL HIGH (ref 70–99)
Glucose-Capillary: 130 mg/dL — ABNORMAL HIGH (ref 70–99)

## 2023-03-30 MED ORDER — TRESIBA FLEXTOUCH 100 UNIT/ML ~~LOC~~ SOPN: 8.0000 [IU] | PEN_INJECTOR | Freq: Every day | SUBCUTANEOUS | Status: DC

## 2023-03-30 NOTE — Plan of Care (Signed)

## 2023-03-30 NOTE — Progress Notes (Signed)
 Daily Progress Note   Patient Name: Nicholas Weiss       Date: 03/30/2023 DOB: May 16, 1950  Age: 72 y.o. MRN#: [id number] Attending Physician: Caleen Qualia, MD Primary Care Physician: Alla Amis, MD Admit Date: 03/26/2023  Reason for Consultation/Follow-up: Establishing goals of care  Subjective: Notes reviewed.  In to see patient.  Today he removes the covers from his head.  He denies complaint at this time.  He is able to tell me where we are, and that tomorrow is New Year's Day.  Patient confirms he would want his significant other to be his decision maker and is aware that his sons would make decisions without an AD.  Previously he stated that this would be okay as he knows they will make decisions together.  He is aware that this can be completed if he wishes.  He confirms full code/full scope.   Length of Stay: 4  Current Medications: Scheduled Meds:  . apixaban   5 mg Oral BID  . insulin  aspart  0-5 Units Subcutaneous QHS  . insulin  aspart  0-9 Units Subcutaneous TID WC  . insulin  aspart  2 Units Subcutaneous TID WC  . insulin  glargine-yfgn  5 Units Subcutaneous BID  . iron  polysaccharides  150 mg Oral Daily  . levETIRAcetam   500 mg Oral BID  . multivitamin with minerals  1 tablet Oral Daily  . Vitamin D  (Ergocalciferol )  50,000 Units Oral Q7 days    Continuous Infusions:   PRN Meds: acetaminophen  **OR** acetaminophen , hydrALAZINE , ondansetron  **OR** ondansetron  (ZOFRAN ) IV, polyethylene glycol  Physical Exam Pulmonary:     Effort: Pulmonary effort is normal.  Neurological:     Mental Status: He is alert.             Vital Signs: BP (!) 150/98 (BP Location: Right Arm)   Pulse 77   Temp 98 F (36.7 C) (Oral)   Resp 14   Ht 5' 10 (1.778 m)   Wt 62.2  kg   SpO2 100%   BMI 19.68 kg/m  SpO2: SpO2: 100 % O2 Device: O2 Device: Room Air O2 Flow Rate:    Intake/output summary:  Intake/Output Summary (Last 24 hours) at 03/30/2023 1232 Last data filed at 03/30/2023 0153 Gross per 24 hour  Intake --  Output 200 ml  Net -200 ml  LBM: Last BM Date : 03/30/23 Baseline Weight: Weight: 62.6 kg Most recent weight: Weight: 62.2 kg    Patient Active Problem List   Diagnosis Date Noted  . Hyperosmolar hyperglycemic state (HHS) (HCC) 03/26/2023  . Iron  deficiency anemia 03/26/2023  . Seizure disorder (HCC) 03/26/2023  . Systemic inflammatory response syndrome (SIRS) associated with organ dysfunction (HCC) 03/26/2023  . NSTEMI (non-ST elevated myocardial infarction) (HCC) 03/09/2023  . Elevated troponin 03/08/2023  . Paroxysmal atrial fibrillation (HCC) 03/08/2023  . Chronic anticoagulation 03/08/2023  . ICH (intracerebral hemorrhage) (HCC) 02/16/2023  . Uncontrolled diabetes mellitus with hyperglycemia (HCC) 02/16/2023  . GERD without esophagitis 02/16/2023  . Hypertension 02/16/2023  . Diabetic neuropathy (HCC) 02/16/2023  . Intraventricular hemorrhage (HCC) 02/16/2023  . Myoclonus 01/15/2023  . Hypothermia 12/29/2022  . Dizziness 11/13/2022  . Substance abuse (HCC) 09/18/2022  . History of GI bleed 09/18/2022  . History of intraventricular hemorrhage following syncopal event 02/15/2023 09/18/2022  . Uncontrolled type 1 diabetes mellitus with hyperglycemia, with long-term current use of insulin  (HCC) 08/26/2022  . Postural dizziness with presyncope 08/18/2022  . Near syncope 08/18/2022  . Closed fracture of shaft of left humerus 06/18/2022  . Malnutrition of moderate degree 06/10/2022  . Hypotension 06/10/2022  . GI bleed 06/06/2022  . Subdural hematoma (HCC) 05/15/2022  . Falls 05/15/2022  . Shoulder pain, left 05/15/2022  . History of amputation of left great toe (HCC) 05/04/2022  . Hypophosphatemia 04/28/2022  .  Protein-calorie malnutrition, severe 04/24/2022  . Diabetic ulcer of toe associated with diabetes mellitus due to underlying condition, with fat layer exposed (HCC) 04/24/2022  . Streptococcal bacteremia 04/24/2022  . Leukopenia 04/24/2022  . History of multiple myeloma 04/24/2022  . Dysautonomia orthostatic hypotension with recurrent syncope 04/24/2022  . Weight loss 04/23/2022  . Osteomyelitis of left foot (HCC) 04/22/2022  . DKA, type 1 (HCC) 04/22/2022  . Dehydration 04/08/2022  . Diabetic ketoacidosis without coma associated with type 1 diabetes mellitus (HCC) 04/07/2022  . Hyperkalemia 04/07/2022  . Traumatic pneumothorax 02/18/2022  . Multiple fractures of ribs, left side, initial encounter for closed fracture 02/18/2022  . Pneumothorax, closed, traumatic 02/18/2022  . Benign prostatic hyperplasia with nocturia 02/12/2022  . Stroke (cerebrum) (HCC) 11/07/2021  . Multiple myeloma (HCC) 09/29/2021  . Multiple myeloma not having achieved remission (HCC) 09/29/2021  . Hyponatremia 08/18/2021  . COVID-19 virus infection 08/13/2021  . History of CVA (cerebrovascular accident) 08/13/2021  . Syncope and collapse, recurrent 08/09/2021  . Hepatic lesion 08/05/2021  . Dyslipidemia 05/08/2021  . History of substance abuse (HCC) 12/09/2020  . Acute metabolic encephalopathy 12/02/2020  . Cocaine abuse (HCC) 11/30/2020  . Symptomatic anemia 11/08/2020  . Muscle twitching 09/12/2020  . 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  . Pre-syncope 08/01/2020  . Heart block AV first degree 08/01/2020  . Anemia of chronic kidney failure, stage 3 (moderate) (HCC) 05/28/2020  . PUD (peptic ulcer disease)   . Esophageal dysphagia   . Encounter for screening colonoscopy   . Hyperglycemia 07/28/2019  . Generalized weakness 07/28/2019  . Hypoglycemia 04/27/2019  . Unresponsiveness 04/27/2019  . Lung nodule 04/07/2019  . Type 1 diabetes mellitus (HCC)   . Acute renal failure superimposed on stage 3a  chronic kidney disease (HCC)   . Hypertensive urgency 04/05/2019  . AKI (acute kidney injury) (HCC) 04/05/2019  . CKD (chronic kidney disease) stage 3, GFR 30-59 ml/min (HCC) 04/05/2019  . Hyperlipidemia 01/24/2016  . Essential (primary) hypertension 06/07/2006  . Type 1 diabetes mellitus with diabetic neuropathy,  unspecified (HCC) 05/31/2006    Palliative Care Assessment & Plan    Recommendations/Plan: Patient confirms full code/full scope. PMT will shadow peripherally.  Please reach out if needs arise.  Code Status:    Code Status Orders  (From admission, onward)           Start     Ordered   03/26/23 1649  Full code  Continuous       Question:  By:  Answer:  Default: patient does not have capacity for decision making, no surrogate or prior directive available   03/26/23 1650           Code Status History     Date Active Date Inactive Code Status Order ID Comments User Context   03/09/2023 0020 03/19/2023 2147 Full Code [id number]  Cleatus Delayne GAILS, MD ED   02/16/2023 0201 02/24/2023 2251 Full Code [id number]  Mansy, Madison LABOR, MD ED   12/29/2022 1210 12/30/2022 2055 Full Code [id number]  Kandis Devaughn Sayres, MD ED   11/13/2022 1930 11/15/2022 1804 Full Code [id number]  Arnett Saunders, MD ED   09/18/2022 2212 09/20/2022 2045 Full Code [id number]  Cleatus Delayne GAILS, MD ED   08/18/2022 2259 08/26/2022 1718 Full Code [id number]  Moody Alto, MD ED   07/13/2022 1648 07/16/2022 1739 Full Code [id number]  Lanetta Lingo, MD ED   07/13/2022 1646 07/13/2022 1648 Full Code [id number]  Lanetta Lingo, MD ED   07/09/2022 0743 07/11/2022 1642 Full Code [id number]  Eldonna Elspeth PARAS, MD ED   06/06/2022 1214 06/12/2022 2119 Full Code [id number]  Juvenal Harlene PENNER, DO ED   05/15/2022 0117 05/28/2022 2102 Full Code [id number]  Tobie Mario GAILS, MD ED   05/15/2022 0109 05/15/2022 0117 Full Code [id number]  Tobie Mario GAILS, MD ED   04/21/2022 0206 04/29/2022 2149 Full Code [id number]  Kathrene Almarie Bake, NP ED   04/07/2022  0449 04/09/2022 0015 Full Code [id number]  Cleatus Delayne GAILS, MD ED   03/09/2022 2351 03/12/2022 2244 Full Code [id number]  Tobie Mario GAILS, MD ED   02/18/2022 0134 02/25/2022 2050 Full Code [id number]  Laurence Locus, DO ED   02/18/2022 0047 02/18/2022 0134 Full Code [id number]  Laurence Locus, DO ED   12/07/2021 0047 12/09/2021 1843 Full Code [id number]  Kathrene Almarie Bake, NP ED   11/27/2021 0109 11/28/2021 2015 Full Code [id number]  Kathrene Almarie Bake, NP ED   08/09/2021 2113 08/11/2021 1742 Full Code [id number]  Tobie Mario GAILS, MD ED   12/06/2020 1712 12/07/2020 1953 Full Code [id number]  Cox, Amy N, DO ED   11/28/2020 2235 12/04/2020 1508 Full Code [id number]  Jeryl Mardene RAMAN, DO ED   09/12/2020 0326 09/13/2020 1803 Full Code [id number]  Mansy, Madison LABOR, MD ED   08/01/2020 1537 08/02/2020 1553 Full Code [id number]  Cox, Amy N, DO ED   11/06/2019 1905 11/07/2019 1955 Full Code [id number]  Donnamarie Lebron PARAS, MD ED   07/28/2019 0306 07/29/2019 2343 Full Code [id number]  Cleatus Delayne GAILS, MD ED   04/27/2019 0632 04/28/2019 1739 Full Code [id number]  Mansy, Madison LABOR, MD ED   04/05/2019 1936 04/07/2019 1950 Full Code [id number]  Cleatus Delayne GAILS, MD ED   04/07/2016 1032 04/08/2016 1732 Full Code [id number]  Florie Rouse, DO Inpatient   04/07/2016 1032 04/07/2016 1032 Full Code [id number]  Hugelmeyer, Rouse, DO Inpatient      Thank you for allowing the Palliative Medicine Team to assist in the care of this patient.   Camelia Lewis, NP  Please contact  Palliative Medicine Team phone at [phone] for questions and concerns.

## 2023-03-30 NOTE — Inpatient Diabetes Management (Signed)
 Inpatient Diabetes Program Recommendations  AACE/ADA: New Consensus Statement on Inpatient Glycemic Control (2015)  Target Ranges:  Prepandial:   less than 140 mg/dL      Peak postprandial:   less than 180 mg/dL (1-2 hours)      Critically ill patients:  140 - 180 mg/dL    Latest Reference Range & Units 03/29/23 07:49 03/29/23 11:32 03/29/23 16:40 03/29/23 18:08 03/29/23 21:09  Glucose-Capillary 70 - 99 mg/dL 715 (H)  5 units Novolog   5 units Semglee  @0912   210 (H)  3 units Novolog   27 (LL) 158 (H) 288 (H)  3 units Novolog   5 units Semglee  @2129    (LL): Data is critically low (H): Data is abnormally high  Latest Reference Range & Units 03/30/23 05:23 03/30/23 08:02  Glucose-Capillary 70 - 99 mg/dL 61 (L) 874 (H)  3 units Novolog    (L): Data is abnormally low (H): Data is abnormally high    History: Type 1 diabetes  Home DM Meds: Tresiba  5 units daily        Novolog  0-9 units per SSI  Current Orders: Semglee  5 units BID      Novolog  Sensitive Correction Scale/ SSI (0-9 units) TID AC + HS      Novolog  2 units TID with meals    MD- Note Hypoglycemia this AM  Please consider reducing the Semglee  to 3 units BID (per records, only takes 5 units basal insulin  daily at home)     --Will follow patient during hospitalization--  Adina Rudolpho Arrow RN, MSN, CDCES Diabetes Coordinator Inpatient Glycemic Control Team Team Pager: [phone] (8a-5p)

## 2023-03-30 NOTE — Discharge Summary (Addendum)
 Addendum:  Patient discharged 03/30/23 but delayed due to pending insurance auth.  Approved 03/31/23. On day of discharge- patient had fasting hypoglycemia CBG of 29 but was alert and oriented without symptoms. He drank juice and repeat CBG was 93.  I discontinued his nighttime insulin .  He remains stable for discharge and other medications remain unchanged from prior provider discharge instructions as listed below.   Nicholas LITTIE Piety, DO [facility] 03/31/2023, 7:59 AM  ---------------------------------------------------------------------------------------    Physician Discharge Summary   Patient: Nicholas Weiss MRN: [id number] DOB: 1951/01/22  Admit date:     03/26/2023  Discharge date: 03/30/23  Discharge Physician: Amaryllis Dare   PCP: Alla Amis, MD   Recommendations at discharge:  Please obtain CBC and BMP and follow-up Please try arranging continuous glucose monitoring for him as he has very labile diabetes with extreme variations. Monitor his blood glucose level closely and adjust insulin  frequently. Follow-up with primary care provider and endocrinologist  Discharge Diagnoses: Principal Problem:   Acute metabolic encephalopathy Active Problems:   Systemic inflammatory response syndrome (SIRS) associated with organ dysfunction (HCC)   Uncontrolled type 1 diabetes mellitus with hyperglycemia, with long-term current use of insulin  (HCC)   Hyperosmolar hyperglycemic state (HHS) (HCC)   AKI (acute kidney injury) (HCC)   Hypertension   Paroxysmal atrial fibrillation (HCC)   History of intraventricular hemorrhage following syncopal event 02/15/2023   Dysautonomia orthostatic hypotension with recurrent syncope   Hyperlipidemia   Iron  deficiency anemia   Multiple myeloma (HCC)   Seizure disorder [facility])   Hospital Course:  Nicholas Weiss is a 72 y.o. male with medical history significant of  hypertension, prior CVA, type 1 diabetes with history of frequent  DKA, multiple myeloma, CKD, paroxysmal atrial fibrillation on Eliquis , history of myoclonus on Keppra , orthostatic hypotension and recurrent syncope, frequent hospitalizations, was sent to ED from his facility with complaint of altered mental status and frequent falls.   Patient was complaining of burning with urination since yesterday.  Had 3 falls today with generalized weakness and overall not feeling well.  Poor p.o. intake.  No fever or chills.  No nausea, vomiting or diarrhea.  No upper respiratory symptoms.  Patient with history of frequent hospitalization most recent was from 12/9 till 20 when he was admitted for DKA.  History of labile diabetes and autonomic dysfunction.    On presentation patient had mild tachycardia and tachypnea, afebrile, labs with WBC of 3.6, hemoglobin 8.3, pseudohyponatremia secondary to hyperglycemia and blood glucose of 665, bicarb 18, BUN 58, creatinine 2.54, lactic acid 3.1 which increased to 4.5 on subsequent check.  Respiratory panel negative for COVID, influenza and RSV.  Troponin 14. Pan scan which include chest x-ray with some right eighth and ninth rib fracture, CT head, cervical spine, CT chest, abdomen and pelvis are all negative for any other acute abnormality.   Patient was given IV fluid and a dose of cefepime , vancomycin  and Flagyl .  Blood cultures were drawn.  Unfortunately UA was not done despite being ordered before getting antibiotics.  Endo tool with insulin  infusion was initially ordered but his blood glucose significantly improved with IV fluid so it was discontinued and he was started on basal and short-acting.  12/28: Vital stable labs with hyperkalemia at 5.2, pseudohyponatremia with hyperglycemia, blood glucose above 400, hemoglobin dropped to 7.2, no obvious bleeding.  Lactic acidosis resolved, preliminary blood cultures negative, UA with leukocytes and bacteria but it was obtained after getting broad-spectrum antibiotics, cultures pending,  giving  some extra fluid and insulin  and will continue ceftriaxone  for now.  12/29: Vital remains stable, quite labile blood glucose level with extreme variations.  Hemoglobin decreased to 6.7, no obvious bleeding.  Giving 1 unit of PRBC.  Renal functions continue to improve.  PT and OT are recommending SNF.  12/30: Mildly elevated blood pressure, hemoglobin at 8.4 s/p 1 unit of PRBC.  Renal function improved.  Urine cultures with no growth but they were done after starting antibiotics, will complete a 5-day course. CBG again elevated, adding 2 units with meal along with SSI  12/31: Patient remained hemodynamically stable.  CBG improved did had a mild episode of hypoglycemia overnight.  Patient did receive antibiotics for the concern of his presentation.  Cultures remain negative but the were drawn after the antibiotics.  Patient will get benefit from a continuous glucose monitor and a close monitoring for his insulin  requirement to avoid further complications and hospitalizations.  He need to follow-up with an endocrinologist for further assistance.  Patient will continue on current medications and follow-up with his providers for further management.  Assessment and Plan: * Acute metabolic encephalopathy Likely multifactorial with concern of sepsis but currently no source of infection and hyperosmolar hyperglycemia. Frequent hospitalizations.  At baseline now. -Palliative care consult -Continue to monitor -Treat underlying conditions  Systemic inflammatory response syndrome (SIRS) associated with organ dysfunction [facility]) Patient met SIRS response criteria with tachycardia, tachypnea, no leukocytosis but did had lactic acidosis and AKI, Lactic acidosis has resolved.  Creatinine at 1.94 All imaging negative for any obvious source of infection. UA was ordered but never done and patient already received broad-spectrum antibiotics in ED. blood cultures ordered and pending.  UA which was obtained  after antibiotics with moderate leukocytosis and mild microscopic hematuria, urine cultures with no growth but they were done after starting antibiotic.  We will treat for total of 5 days Respiratory panel negative for COVID, flu and are RSV -As no confirmed source of infection at this time-sepsis ruled out -Completed a 5-day course with Rocephin .  Hyperosmolar hyperglycemic state (HHS) (HCC) Patient with significantly elevated blood glucose above 600, nonanion gap metabolic acidosis, likely HHS.  Increase serum osmolality.  History of labile diabetes and multiple DKA's. -Initially Endo tool orders were placed but his CBG significantly improved with fluid resuscitation so that was discontinued. Blood sugar remains very labile with extreme variation -Continue Smeglee -Add 2 units of NovoLog  with meal -Continue with SSI -Patient will get benefit from continuous glucose monitoring and outpatient endocrinology follow-up  Uncontrolled type 1 diabetes mellitus with hyperglycemia, with long-term current use of insulin  [facility]) Patient with history of labile diabetes and DKA's. -Please see above for management  AKI (acute kidney injury) (HCC) Creatinine at 2.54 with baseline appears to be around 1.7 with history of CKD stage IIIa.  Creatinine improved to 1.43 today -Encourage p.o. hydration -Monitor renal function -Avoid nephrotoxins  Hypertension Patient with hydralazine  and midodrine  all listed in his chart.  Blood pressure pressure currently on lower normal. Blood pressure now elevated -Restarting home hydralazine  -Continue to monitor  Paroxysmal atrial fibrillation (HCC) Currently rate in 90s. -Continuing home Eliquis   History of intraventricular hemorrhage following syncopal event 02/15/2023 History of intraventricular hemorrhage with syncopal episode in November 2024.  CT head today without any acute abnormality -Eliquis  was restarted during prior hospitalization as hemorrhage was  resolved. -  Dysautonomia orthostatic hypotension with recurrent syncope Resulted in frequent falls. -PT and OT evaluation -Positive orthostatic vitals -Use lower extremity compression  stocking and abdominal binder  Hyperlipidemia -Continue statin  Iron  deficiency anemia Further decrease of hemoglobin to 6.7 this morning, no obvious bleeding.   -Ordered 1 unit of PRBC -Continuing iron  supplement -Monitor hemoglobin and transfuse if below 7  Multiple myeloma (HCC) -Outpatient follow-up with oncology  Seizure disorder (HCC) History of myoclonus. -Continue Keppra    Consultants: None Procedures performed: None Disposition: Skilled nursing facility Diet recommendation:  Discharge Diet Orders (From admission, onward)     Start     Ordered   03/30/23 0000  Diet - low sodium heart healthy        03/30/23 1042           Cardiac and Carb modified diet DISCHARGE MEDICATION: Allergies as of 03/30/2023       Reactions   Penicillins Other (See Comments)   Childhood allergy -tolerated amoxil  03/2022 Unknown reaction Has patient had a PCN reaction causing immediate rash, facial/tongue/throat swelling, SOB or lightheadedness with hypotension: No Has patient had a PCN reaction causing severe rash involving mucus membranes or skin necrosis: No Has patient had a PCN reaction that required hospitalization No Has patient had a PCN reaction occurring within the last 10 years: No If all of the above answers are NO, then may proceed with Cephalosporin use.        Medication List     STOP taking these medications    midodrine  10 MG tablet Commonly known as: PROAMATINE        TAKE these medications    acetaminophen  500 MG tablet Commonly known as: TYLENOL  Take 1,000 mg by mouth daily as needed for mild pain or moderate pain.   apixaban  5 MG Tabs tablet Commonly known as: ELIQUIS  Take 1 tablet (5 mg total) by mouth 2 (two) times daily. Start Eliquis  after 2 weeks  which will be 03/02/2023   ascorbic acid  500 MG tablet Commonly known as: VITAMIN C  Take 1 tablet (500 mg total) by mouth daily.   atorvastatin  80 MG tablet Commonly known as: LIPITOR  Take 1 tablet (80 mg total) by mouth at bedtime. Hold while taking Paxlovid    bisacodyl  5 MG EC tablet Commonly known as: bisacodyl  Take 1 tablet (5 mg total) by mouth at bedtime. Skip the dose if no constipation   fludrocortisone  0.1 MG tablet Commonly known as: FLORINEF  Take 1 tablet (0.1 mg total) by mouth daily. Skip the dose if SBP >130   gabapentin  300 MG capsule Commonly known as: NEURONTIN  Take 300 mg by mouth 2 (two) times daily.   hydrALAZINE  25 MG tablet Commonly known as: APRESOLINE  Take 1 tablet (25 mg total) by mouth 3 (three) times daily as needed (SBP >160).   iron  polysaccharides 150 MG capsule Commonly known as: NIFEREX Take 1 capsule (150 mg total) by mouth daily.   levETIRAcetam  500 MG tablet Commonly known as: KEPPRA  Take 1 tablet (500 mg total) by mouth 2 (two) times daily.   multivitamin with minerals Tabs tablet Take 1 tablet by mouth daily.   NovoLOG  100 UNIT/ML injection Generic drug: insulin  aspart Sliding Scale: Glucose 70 - 120: 0 units  Glucose 121 - 150: 1 unit  Glucose 151 - 200: 2 units Glucose 201 - 250: 3 units  Glucose 251 - 300: 5 units  Glucose 301 - 350: 7 units  Glucose 351 - 400: 9 units  Glucose > 400: call your docotr   ondansetron  8 MG tablet Commonly known as: ZOFRAN  Take 8 mg by mouth daily as needed for nausea or vomiting.  polyethylene glycol 17 g packet Commonly known as: MiraLax  Take 17 g by mouth 2 (two) times daily. Skip the dose if no constipation   traZODone  50 MG tablet Commonly known as: DESYREL  Take 0.5 tablets (25 mg total) by mouth at bedtime as needed for sleep.   Tresiba  FlexTouch 100 UNIT/ML FlexTouch Pen Generic drug: insulin  degludec Inject 8 Units into the skin daily. Home med. What changed: how much to  take   Vitamin D  (Ergocalciferol ) 1.25 MG (50000 UNIT) Caps capsule Commonly known as: DRISDOL  Take 1 capsule (50,000 Units total) by mouth every 7 (seven) days.        Contact information for follow-up providers     Alla Amis, MD. Schedule an appointment as soon as possible for a visit in 1 week(s).   Specialty: Family Medicine Contact information: [address] [facility] [place] [address] [phone]              Contact information for after-discharge care     Destination     [facility] .   Service: Skilled Nursing Contact information: [address] [place]  [address] [phone]                    Discharge Exam: Nicholas Weiss   03/27/23 0500 03/29/23 0452 03/30/23 0500  Weight: 62.3 kg 64 kg 62.2 kg   General.  Frail elderly man, in no acute distress. Pulmonary.  Lungs clear bilaterally, normal respiratory effort. CV.  Regular rate and rhythm, no JVD, rub or murmur. Abdomen.  Soft, nontender, nondistended, BS positive. CNS.  Alert and oriented to self.  No focal neurologic deficit. Extremities.  No edema, no cyanosis, pulses intact and symmetrical.   Condition at discharge: stable  The results of significant diagnostics from this hospitalization (including imaging, microbiology, ancillary and laboratory) are listed below for reference.   Imaging Studies: CT CHEST ABDOMEN PELVIS WO CONTRAST Result Date: 03/26/2023 CLINICAL DATA:  Sepsis.  Multiple myeloma. EXAM: CT CHEST, ABDOMEN AND PELVIS WITHOUT CONTRAST TECHNIQUE: Multidetector CT imaging of the chest, abdomen and pelvis was performed following the standard protocol without IV contrast. RADIATION DOSE REDUCTION: This exam was performed according to the departmental dose-optimization program which includes automated exposure control, adjustment of the mA and/or kV according to patient size and/or use of iterative reconstruction  technique. COMPARISON:  Chest CT dated 02/15/2023. FINDINGS: Evaluation of this exam is limited in the absence of intravenous contrast. CT CHEST FINDINGS Cardiovascular: There is no cardiomegaly. Trace pericardial effusion. Advanced 3 vessel coronary vascular calcification. There is moderate atherosclerotic calcification of the thoracic aorta. No aneurysmal dilatation. The central pulmonary arteries are grossly unremarkable. Mediastinum/Nodes: No hilar or mediastinal adenopathy. The esophagus and the thyroid  gland are grossly unremarkable. No mediastinal fluid collection. Lungs/Pleura: Left lung base linear atelectasis/scarring. Small left lower lobe subpleural calcified granuloma. No focal consolidation, pleural effusion, or pneumothorax. The central airways are patent. Musculoskeletal: Osteopenia with degenerative changes of the spine. Fractures of the lateral right 8th-10th ribs as seen on the prior CT. No new fractures. CT ABDOMEN PELVIS FINDINGS No intra-abdominal free air or free fluid. Hepatobiliary: Indeterminate 15 mm hypodense lesion in the right lobe of the liver, present on the CT of 2023, possibly a cyst or hemangioma. No biliary dilatation. The gallbladder is unremarkable as visualized. Pancreas: Unremarkable. No pancreatic ductal dilatation or surrounding inflammatory changes. Spleen: Normal in size without focal abnormality. Adrenals/Urinary Tract: The adrenal glands unremarkable. There is no hydronephrosis  or nephrolithiasis on either side. The visualized ureters and urinary bladder appear unremarkable. Stomach/Bowel: There is moderate stool throughout the colon. There is no bowel obstruction or active inflammation. The appendix is normal. Vascular/Lymphatic: Advanced aortoiliac atherosclerotic disease. The IVC is unremarkable. No portal venous gas. There is no adenopathy. Reproductive: The prostate and seminal vesicles are grossly unremarkable. No pelvic mass. Other: None Musculoskeletal:  Osteopenia with degenerative changes of the spine. There is mild compression fracture of superior endplate of L1, present on the prior CT. Correlation with clinical exam and point tenderness recommended. No new fracture. IMPRESSION: 1. No acute intrathoracic, abdominal, or pelvic pathology. 2. No bowel obstruction. Normal appendix. 3. Mild compression fracture of superior endplate of L1, present on the prior CT. Correlation with clinical exam and point tenderness recommended. 4.  Aortic Atherosclerosis (ICD10-I70.0). Electronically Signed   By: Vanetta Chou M.D.   On: 03/26/2023 16:05   DG Chest 2 View Result Date: 03/26/2023 CLINICAL DATA:  Chest pain.  Altered mental status and falls EXAM: CHEST - 2 VIEW COMPARISON:  Chest radiograph dated 03/09/2023 FINDINGS: Normal lung volumes. No focal consolidations. No pleural effusion or pneumothorax. The heart size and mediastinal contours are within normal limits. Minimally displaced right lateral eighth and ninth rib fractures. IMPRESSION: Minimally displaced right lateral eighth and ninth rib fractures. No pneumothorax. Electronically Signed   By: Limin  Xu M.D.   On: 03/26/2023 13:46   CT Head Wo Contrast Result Date: 03/26/2023 CLINICAL DATA:  Head trauma, minor (Age >= 65y); Neck trauma (Age >= 65y) EXAM: CT HEAD WITHOUT CONTRAST CT CERVICAL SPINE WITHOUT CONTRAST TECHNIQUE: Multidetector CT imaging of the head and cervical spine was performed following the standard protocol without intravenous contrast. Multiplanar CT image reconstructions of the cervical spine were also generated. RADIATION DOSE REDUCTION: This exam was performed according to the departmental dose-optimization program which includes automated exposure control, adjustment of the mA and/or kV according to patient size and/or use of iterative reconstruction technique. COMPARISON:  CT head 02/20/23 FINDINGS: CT HEAD FINDINGS Brain: No evidence of acute infarction, hemorrhage, hydrocephalus,  extra-axial collection or mass lesion/mass effect. Unchanged calcific density adjacent to the left cavernous sinus and along the falx. Vascular: No hyperdense vessel or unexpected calcification. Skull: Normal. Negative for fracture or focal lesion. Sinuses/Orbits: Middle ear or mastoid effusion. Paranasal sinuses clear. Orbits are unremarkable. Other: None. CT CERVICAL SPINE FINDINGS Alignment: Normal. Skull base and vertebrae: No acute fracture. No primary bone lesion or focal pathologic process. Soft tissues and spinal canal: No prevertebral fluid or swelling. No visible canal hematoma. Disc levels:  No CT evidence of high-grade spinal canal stenosis Upper chest: Negative. Other: None IMPRESSION: 1. No acute intracranial abnormality. 2. No acute fracture or traumatic subluxation of the cervical spine. Electronically Signed   By: Lyndall Gore M.D.   On: 03/26/2023 13:19   CT Cervical Spine Wo Contrast Result Date: 03/26/2023 CLINICAL DATA:  Head trauma, minor (Age >= 65y); Neck trauma (Age >= 65y) EXAM: CT HEAD WITHOUT CONTRAST CT CERVICAL SPINE WITHOUT CONTRAST TECHNIQUE: Multidetector CT imaging of the head and cervical spine was performed following the standard protocol without intravenous contrast. Multiplanar CT image reconstructions of the cervical spine were also generated. RADIATION DOSE REDUCTION: This exam was performed according to the departmental dose-optimization program which includes automated exposure control, adjustment of the mA and/or kV according to patient size and/or use of iterative reconstruction technique. COMPARISON:  CT head 02/20/23 FINDINGS: CT HEAD FINDINGS Brain: No evidence of acute  infarction, hemorrhage, hydrocephalus, extra-axial collection or mass lesion/mass effect. Unchanged calcific density adjacent to the left cavernous sinus and along the falx. Vascular: No hyperdense vessel or unexpected calcification. Skull: Normal. Negative for fracture or focal lesion.  Sinuses/Orbits: Middle ear or mastoid effusion. Paranasal sinuses clear. Orbits are unremarkable. Other: None. CT CERVICAL SPINE FINDINGS Alignment: Normal. Skull base and vertebrae: No acute fracture. No primary bone lesion or focal pathologic process. Soft tissues and spinal canal: No prevertebral fluid or swelling. No visible canal hematoma. Disc levels:  No CT evidence of high-grade spinal canal stenosis Upper chest: Negative. Other: None IMPRESSION: 1. No acute intracranial abnormality. 2. No acute fracture or traumatic subluxation of the cervical spine. Electronically Signed   By: Lyndall Gore M.D.   On: 03/26/2023 13:19   ECHOCARDIOGRAM COMPLETE Result Date: 03/10/2023    ECHOCARDIOGRAM REPORT   Patient Name:   Nicholas Weiss Date of Exam: 03/10/2023 Medical Rec #:  [id number]       Height:       70.0 in Accession #:    [id number]      Weight:       138.0 lb Date of Birth:  1950-06-21      BSA:          1.783 m Patient Age:    71 years        BP:           156/86 mmHg Patient Gender: M               HR:           87 bpm. Exam Location:  [facility] Procedure: 2D Echo, Cardiac Doppler and Color Doppler Indications:     Elevated Troponin  History:         Patient has prior history of Echocardiogram examinations, most                  recent 08/19/2022. Acute MI, Stroke, Arrythmias:Atrial                  Fibrillation, Signs/Symptoms:Dizziness/Lightheadedness, Syncope                  and Bacteremia; Risk Factors:Hypertension, Diabetes and                  Dyslipidemia. Substance abuse, CKD.  Sonographer:     Naomie Reef Referring Phys:  [id number] CARALYN HUDSON Diagnosing Phys: Cara JONETTA Lovelace MD  Sonographer Comments: Image acquisition challenging due to uncooperative patient. IMPRESSIONS  1. Left ventricular ejection fraction, by estimation, is 65 to 70%. The left ventricle has normal function. The left ventricle has no regional wall motion abnormalities. There is mild asymmetric left ventricular  hypertrophy of the basal-septal segment. Left ventricular diastolic parameters are consistent with Grade I diastolic dysfunction (impaired relaxation).  2. Right ventricular systolic function is normal. The right ventricular size is normal. There is normal pulmonary artery systolic pressure.  3. Left atrial size was mildly dilated.  4. Right atrial size was mildly dilated.  5. The mitral valve is normal in structure. No evidence of mitral valve regurgitation.  6. The aortic valve is normal in structure. Aortic valve regurgitation is not visualized. FINDINGS  Left Ventricle: Left ventricular ejection fraction, by estimation, is 65 to 70%. The left ventricle has normal function. The left ventricle has no regional wall motion abnormalities. The left ventricular internal cavity size was normal in size. There is  mild asymmetric left ventricular hypertrophy of  the basal-septal segment. Left ventricular diastolic parameters are consistent with Grade I diastolic dysfunction (impaired relaxation). Right Ventricle: The right ventricular size is normal. No increase in right ventricular wall thickness. Right ventricular systolic function is normal. There is normal pulmonary artery systolic pressure. The tricuspid regurgitant velocity is 2.49 m/s, and  with an assumed right atrial pressure of 8 mmHg, the estimated right ventricular systolic pressure is 32.8 mmHg. Left Atrium: Left atrial size was mildly dilated. Right Atrium: Right atrial size was mildly dilated. Pericardium: There is no evidence of pericardial effusion. Mitral Valve: The mitral valve is normal in structure. No evidence of mitral valve regurgitation. MV peak gradient, 9.6 mmHg. The mean mitral valve gradient is 2.0 mmHg. Tricuspid Valve: The tricuspid valve is normal in structure. Tricuspid valve regurgitation is trivial. Aortic Valve: The aortic valve is normal in structure. Aortic valve regurgitation is not visualized. Aortic valve mean gradient measures 2.0  mmHg. Aortic valve peak gradient measures 4.1 mmHg. Aortic valve area, by VTI measures 3.29 cm. Pulmonic Valve: The pulmonic valve was normal in structure. Pulmonic valve regurgitation is not visualized. Aorta: The ascending aorta was not well visualized. IAS/Shunts: No atrial level shunt detected by color flow Doppler.  LEFT VENTRICLE PLAX 2D LVIDd:         3.90 cm LVIDs:         2.40 cm LV PW:         1.20 cm LV IVS:        1.50 cm LVOT diam:     2.40 cm LV SV:         61 LV SV Index:   34 LVOT Area:     4.52 cm  RIGHT VENTRICLE RV Basal diam:  3.80 cm RV Mid diam:    3.30 cm TAPSE (M-mode): 2.4 cm LEFT ATRIUM             Index        RIGHT ATRIUM           Index LA diam:        4.40 cm 2.47 cm/m   RA Area:     15.20 cm LA Vol (A2C):   84.9 ml 47.62 ml/m  RA Volume:   38.50 ml  21.59 ml/m LA Vol (A4C):   60.1 ml 33.71 ml/m LA Biplane Vol: 71.3 ml 39.99 ml/m  AORTIC VALVE                    PULMONIC VALVE AV Area (Vmax):    3.01 cm     PV Vmax:       0.82 m/s AV Area (Vmean):   3.57 cm     PV Peak grad:  2.7 mmHg AV Area (VTI):     3.29 cm AV Vmax:           101.00 cm/s AV Vmean:          57.100 cm/s AV VTI:            0.184 m AV Peak Grad:      4.1 mmHg AV Mean Grad:      2.0 mmHg LVOT Vmax:         67.10 cm/s LVOT Vmean:        45.000 cm/s LVOT VTI:          0.134 m LVOT/AV VTI ratio: 0.73  AORTA Ao Root diam: 3.70 cm Ao Asc diam:  3.50 cm MITRAL VALVE  TRICUSPID VALVE MV Area (PHT): 7.16 cm     TR Peak grad:   24.8 mmHg MV Area VTI:   2.14 cm     TR Vmax:        249.00 cm/s MV Peak grad:  9.6 mmHg MV Mean grad:  2.0 mmHg     SHUNTS MV Vmax:       1.55 m/s     Systemic VTI:  0.13 m MV Vmean:      60.1 cm/s    Systemic Diam: 2.40 cm MV Decel Time: 106 msec MV E velocity: 122.00 cm/s Cara JONETTA Lovelace MD Electronically signed by Cara JONETTA Lovelace MD Signature Date/Time: 03/10/2023/12:56:50 PM    Final    DG Chest Port 1 View Result Date: 03/09/2023 CLINICAL DATA:  DKA EXAM: PORTABLE  CHEST 1 VIEW COMPARISON:  02/12/2023 FINDINGS: Heart and mediastinal contours are within normal limits. No focal opacities or effusions. No acute bony abnormality. Aortic atherosclerosis. IMPRESSION: No active disease. Electronically Signed   By: Franky Crease M.D.   On: 03/09/2023 01:10   CT Head Wo Contrast Result Date: 03/08/2023 CLINICAL DATA:  Follow-up recent intracranial hemorrhage. EXAM: CT HEAD WITHOUT CONTRAST TECHNIQUE: Contiguous axial images were obtained from the base of the skull through the vertex without intravenous contrast. RADIATION DOSE REDUCTION: This exam was performed according to the departmental dose-optimization program which includes automated exposure control, adjustment of the mA and/or kV according to patient size and/or use of iterative reconstruction technique. COMPARISON:  Head CT dated 02/20/2023. FINDINGS: Evaluation of this exam is limited due to motion artifact. Brain: Mild age-related atrophy and moderate chronic microvascular ischemic changes. Interval resolution of the previously seen intraventricular hemorrhage. No acute intracranial hemorrhage. No mass effect or midline shift. No extra-axial fluid collection. Vascular: No hyperdense vessel or unexpected calcification. Skull: Normal. Negative for fracture or focal lesion. Sinuses/Orbits: No acute finding. Other: None IMPRESSION: 1. No acute intracranial pathology. 2. Interval resolution of the previously seen intraventricular hemorrhage. 3. Mild age-related atrophy and moderate chronic microvascular ischemic changes. Electronically Signed   By: Vanetta Chou M.D.   On: 03/08/2023 22:24    Microbiology: Results for orders placed or performed during the hospital encounter of 03/26/23  Culture, blood (single)     Status: None (Preliminary result)   Collection Time: 03/26/23  2:13 PM   Specimen: BLOOD  Result Value Ref Range Status   Specimen Description BLOOD LEFT ANTECUBITAL  Final   Special Requests   Final     BOTTLES DRAWN AEROBIC AND ANAEROBIC Blood Culture adequate volume   Culture   Final    NO GROWTH 4 DAYS Performed at [facility], [address] [place], [place] [address]    Report Status PENDING  Incomplete  Resp panel by RT-PCR (RSV, Flu A&B, Covid) Anterior Nasal Swab     Status: None   Collection Time: 03/26/23  3:38 PM   Specimen: Anterior Nasal Swab  Result Value Ref Range Status   SARS Coronavirus 2 by RT PCR NEGATIVE NEGATIVE Final    Comment: (NOTE) SARS-CoV-2 target nucleic acids are NOT DETECTED.  The SARS-CoV-2 RNA is generally detectable in upper respiratory specimens during the acute phase of infection. The lowest concentration of SARS-CoV-2 viral copies this assay can detect is 138 copies/mL. A negative result does not preclude SARS-Cov-2 infection and should not be used as the sole basis for treatment or other patient management decisions. A negative result may occur with  improper specimen collection/handling, submission  of specimen other than nasopharyngeal swab, presence of viral mutation(s) within the areas targeted by this assay, and inadequate number of viral copies(<138 copies/mL). A negative result must be combined with clinical observations, patient history, and epidemiological information. The expected result is Negative.  Fact Sheet for Patients:  bloggercourse.com  Fact Sheet for Healthcare Providers:  seriousbroker.it  This test is no t yet approved or cleared by the [place]  FDA and  has been authorized for detection and/or diagnosis of SARS-CoV-2 by FDA under an Emergency Use Authorization (EUA). This EUA will remain  in effect (meaning this test can be used) for the duration of the COVID-19 declaration under Section 564(b)(1) of the Act, 21 U.S.C.section 360bbb-3(b)(1), unless the authorization is terminated  or revoked sooner.       Influenza A by PCR NEGATIVE NEGATIVE Final    Influenza B by PCR NEGATIVE NEGATIVE Final    Comment: (NOTE) The Xpert Xpress SARS-CoV-2/FLU/RSV plus assay is intended as an aid in the diagnosis of influenza from Nasopharyngeal swab specimens and should not be used as a sole basis for treatment. Nasal washings and aspirates are unacceptable for Xpert Xpress SARS-CoV-2/FLU/RSV testing.  Fact Sheet for Patients: bloggercourse.com  Fact Sheet for Healthcare Providers: seriousbroker.it  This test is not yet approved or cleared by the [place]  FDA and has been authorized for detection and/or diagnosis of SARS-CoV-2 by FDA under an Emergency Use Authorization (EUA). This EUA will remain in effect (meaning this test can be used) for the duration of the COVID-19 declaration under Section 564(b)(1) of the Act, 21 U.S.C. section 360bbb-3(b)(1), unless the authorization is terminated or revoked.     Resp Syncytial Virus by PCR NEGATIVE NEGATIVE Final    Comment: (NOTE) Fact Sheet for Patients: bloggercourse.com  Fact Sheet for Healthcare Providers: seriousbroker.it  This test is not yet approved or cleared by the [place]  FDA and has been authorized for detection and/or diagnosis of SARS-CoV-2 by FDA under an Emergency Use Authorization (EUA). This EUA will remain in effect (meaning this test can be used) for the duration of the COVID-19 declaration under Section 564(b)(1) of the Act, 21 U.S.C. section 360bbb-3(b)(1), unless the authorization is terminated or revoked.  Performed at [facility], [address] [place], [place] [address]   Blood culture (single)     Status: None (Preliminary result)   Collection Time: 03/26/23  4:21 PM   Specimen: BLOOD  Result Value Ref Range Status   Specimen Description BLOOD BLOOD RIGHT FOREARM  Final   Special Requests   Final    BOTTLES DRAWN AEROBIC AND ANAEROBIC  Blood Culture adequate volume   Culture   Final    NO GROWTH 4 DAYS Performed at [facility], [address] [place], [place] [address]    Report Status PENDING  Incomplete  Urine Culture (for pregnant, neutropenic or urologic patients or patients with an indwelling urinary catheter)     Status: Abnormal   Collection Time: 03/26/23  6:35 PM   Specimen: Urine, Clean Catch  Result Value Ref Range Status   Specimen Description   Final    URINE, CLEAN CATCH Performed at [facility], [address], [place], [place] [address]    Special Requests   Final    NONE Performed at [facility], [address] [place], [place] [address]    Culture (A)  Final    <10,000 COLONIES/mL INSIGNIFICANT GROWTH Performed at [facility], [address],  [place], [place] [address]    Report Status 03/28/2023 FINAL  Final   *Note: Due to a large number of results and/or encounters for the requested time period, some results have not been displayed. A complete set of results can be found in Results Review.    Labs: CBC: Recent Labs  Lab 03/26/23 1226 03/27/23 0459 03/28/23 0532 03/28/23 1821 03/29/23 0455  WBC 3.6* 4.2 3.0* 3.1* 3.7*  HGB 8.3* 7.2* 6.7* 8.1* 8.4*  HCT 26.9* 23.0* 20.8* 24.0* 25.0*  MCV 101.5* 98.3 96.7 92.0 91.6  PLT 239 215 205 200 209   Basic Metabolic Panel: Recent Labs  Lab 03/27/23 0037 03/27/23 0459 03/27/23 0824 03/28/23 0532 03/29/23 0455  NA 135 134* 137 141 137  K 4.8 5.2* 4.7 4.1 4.7  CL 107 104 108 109 99  CO2 20* 16* 17* 26 25  GLUCOSE 323* 484* 473* 46* 211*  BUN 51* 51* 53* 40* 30*  CREATININE 1.82* 1.94* 1.99* 1.43* 1.19  CALCIUM  8.0* 8.1* 8.0* 8.1* 8.0*  PHOS  --   --   --  2.5 2.7   Liver Function Tests: Recent Labs  Lab 03/26/23 1226 03/28/23 0532 03/29/23 0455  AST 32  --   --   ALT 41  --   --   ALKPHOS 86  --   --   BILITOT 0.9  --   --   PROT 7.8  --   --   ALBUMIN 2.9* 2.4* 2.7*    CBG: Recent Labs  Lab 03/29/23 1640 03/29/23 1808 03/29/23 2109 03/30/23 0523 03/30/23 0802  GLUCAP 27* 158* 288* 61* 125*    Discharge time spent: greater than 30 minutes. This record has been created using Conservation officer, historic buildings. Errors have been sought and corrected,but may not always be located. Such creation errors do not reflect on the standard of care.   Signed: Amaryllis Dare, MD [facility] 03/30/2023

## 2023-03-30 NOTE — Progress Notes (Signed)
 Physical Therapy Treatment Patient Details Name: Nicholas Weiss MRN: [id number] DOB: 12-02-1950 Today's Date: 03/30/2023   History of Present Illness 72 y/o male presented to ED on 03/26/23 from [facility] for fall and AMS. Recent hospitalization 12/9-12/20 for DKA and 11/18-11/27 for IVH from syncopal episode. Admitted for acute metabolic encephalopathy. PMH: HTN, CVA, T1DM, multiple myeloma, CKD 3, Afib on Eliquis , cocaine abuse, orthostatic hypotension and recurrent syncope    PT Comments  Patient tolerated session well. He denies any pain and denies any dizziness. He doe require min A for sit to stand transfer due to LE weakness and elevated height. Patient was able to walk in hallway x100 feet with slower gait speed, demonstrating a reciprocal gait pattern, wide base of support and demonstrates a knee varus position. Patient does require cues for safety and walker management especially in narrow environment. He would benefit from additional skilled PT Intervention to improve strength and mobility.     If plan is discharge home, recommend the following: A little help with walking and/or transfers;Assistance with cooking/housework;Direct supervision/assist for medications management;Direct supervision/assist for financial management;Assist for transportation;Help with stairs or ramp for entrance   Can travel by private vehicle     No  Equipment Recommendations       Recommendations for Other Services       Precautions / Restrictions Precautions Precautions: Fall Precaution Comments: watch orthostatics,abdominal binder Restrictions Weight Bearing Restrictions Per Provider Order: No     Mobility  Bed Mobility Overal bed mobility: Needs Assistance Bed Mobility: Supine to Sit     Supine to sit: Supervision, HOB elevated, Used rails     General bed mobility comments: intermittent vcs for technique    Transfers Overall transfer level: Needs assistance Equipment  used: 1 person hand held assist Transfers: Sit to/from Stand Sit to Stand: Min assist           General transfer comment: Pt denies any dizziness; transferred sit to stand from bed x1 rep, from toilet x1 rep using guardrails; Pt does require min A to slow down sit down due to weakness in lower extremity    Ambulation/Gait Ambulation/Gait assistance: Contact guard assist Gait Distance (Feet): 100 Feet Assistive device: Rolling walker (2 wheels) Gait Pattern/deviations: Step-through pattern, Trunk flexed, Wide base of support Gait velocity: decreased     General Gait Details: pt denies any dizziness; exhibits increased BLE knee varus with wide base of support;   Stairs             Wheelchair Mobility     Tilt Bed    Modified Rankin (Stroke Patients Only)       Balance Overall balance assessment: Needs assistance Sitting-balance support: Feet supported, Bilateral upper extremity supported Sitting balance-Leahy Scale: Good Sitting balance - Comments: pt sat EOB with supervision   Standing balance support: Bilateral upper extremity supported, During functional activity, Reliant on assistive device for balance Standing balance-Leahy Scale: Fair Standing balance comment: Pt reliant on RW for safety                            Cognition                                                Exercises Other Exercises Other Exercises: Instructed patient in LE strengthening/ROM exercise, ankle  DF/PF x15 reps, SLR hip flexion x10 reps, hip abduction/ER x15 reps, all supine with min VCS to increase ROM for better strengthening (x8 min)    General Comments General comments (skin integrity, edema, etc.): denies any dizziness this session;      Pertinent Vitals/Pain Pain Assessment Pain Assessment: No/denies pain    Home Living                          Prior Function            PT Goals (current goals can now be found in the  care plan section) Acute Rehab PT Goals Patient Stated Goal: none stated PT Goal Formulation: With patient/family Time For Goal Achievement: 04/10/23 Potential to Achieve Goals: Fair Progress towards PT goals: Progressing toward goals    Frequency    Min 1X/week      PT Plan      Co-evaluation              AM-PAC PT 6 Clicks Mobility   Outcome Measure  Help needed turning from your back to your side while in a flat bed without using bedrails?: A Little Help needed moving from lying on your back to sitting on the side of a flat bed without using bedrails?: A Little Help needed moving to and from a bed to a chair (including a wheelchair)?: A Little Help needed standing up from a chair using your arms (e.g., wheelchair or bedside chair)?: A Little Help needed to walk in hospital room?: A Little Help needed climbing 3-5 steps with a railing? : A Lot 6 Click Score: 17    End of Session Equipment Utilized During Treatment: Gait belt Activity Tolerance: Treatment limited secondary to medical complications (Comment) Patient left: with call bell/phone within reach;with family/visitor present;in chair;with chair alarm set Nurse Communication: Mobility status PT Visit Diagnosis: Unsteadiness on feet (R26.81);Muscle weakness (generalized) (M62.81);Other abnormalities of gait and mobility (R26.89)     Time: 8487-8454 PT Time Calculation (min) (ACUTE ONLY): 33 min  Charges:    $Gait Training: 8-22 mins $Therapeutic Exercise: 8-22 mins PT General Charges $$ ACUTE PT VISIT: 1 Visit                       Hades Mathew PT, DPT 03/30/2023, 3:50 PM

## 2023-03-30 NOTE — Plan of Care (Signed)
 Problem: Education: Goal: Ability to describe self-care measures that may prevent or decrease complications (Diabetes Survival Skills Education) will improve Outcome: Adequate for Discharge Goal: Individualized Educational Video(s) Outcome: Adequate for Discharge   Problem: Coping: Goal: Ability to adjust to condition or change in health will improve Outcome: Adequate for Discharge   Problem: Fluid Volume: Goal: Ability to maintain a balanced intake and output will improve Outcome: Adequate for Discharge   Problem: Health Behavior/Discharge Planning: Goal: Ability to identify and utilize available resources and services will improve Outcome: Adequate for Discharge Goal: Ability to manage health-related needs will improve Outcome: Adequate for Discharge   Problem: Metabolic: Goal: Ability to maintain appropriate glucose levels will improve Outcome: Adequate for Discharge   Problem: Nutritional: Goal: Maintenance of adequate nutrition will improve Outcome: Adequate for Discharge Goal: Progress toward achieving an optimal weight will improve Outcome: Adequate for Discharge   Problem: Skin Integrity: Goal: Risk for impaired skin integrity will decrease Outcome: Adequate for Discharge   Problem: Tissue Perfusion: Goal: Adequacy of tissue perfusion will improve Outcome: Adequate for Discharge   Problem: Education: Goal: Ability to describe self-care measures that may prevent or decrease complications (Diabetes Survival Skills Education) will improve Outcome: Adequate for Discharge Goal: Individualized Educational Video(s) Outcome: Adequate for Discharge   Problem: Cardiac: Goal: Ability to maintain an adequate cardiac output will improve Outcome: Adequate for Discharge   Problem: Health Behavior/Discharge Planning: Goal: Ability to identify and utilize available resources and services will improve Outcome: Adequate for Discharge Goal: Ability to manage health-related  needs will improve Outcome: Adequate for Discharge   Problem: Fluid Volume: Goal: Ability to achieve a balanced intake and output will improve Outcome: Adequate for Discharge   Problem: Metabolic: Goal: Ability to maintain appropriate glucose levels will improve Outcome: Adequate for Discharge   Problem: Nutritional: Goal: Maintenance of adequate nutrition will improve Outcome: Adequate for Discharge Goal: Maintenance of adequate weight for body size and type will improve Outcome: Adequate for Discharge   Problem: Respiratory: Goal: Will regain and/or maintain adequate ventilation Outcome: Adequate for Discharge   Problem: Urinary Elimination: Goal: Ability to achieve and maintain adequate renal perfusion and functioning will improve Outcome: Adequate for Discharge   Problem: Education: Goal: Knowledge of General Education information will improve Description: Including pain rating scale, medication(s)/side effects and non-pharmacologic comfort measures Outcome: Adequate for Discharge   Problem: Health Behavior/Discharge Planning: Goal: Ability to manage health-related needs will improve Outcome: Adequate for Discharge   Problem: Clinical Measurements: Goal: Ability to maintain clinical measurements within normal limits will improve Outcome: Adequate for Discharge Goal: Will remain free from infection Outcome: Adequate for Discharge Goal: Diagnostic test results will improve Outcome: Adequate for Discharge Goal: Respiratory complications will improve Outcome: Adequate for Discharge Goal: Cardiovascular complication will be avoided Outcome: Adequate for Discharge   Problem: Activity: Goal: Risk for activity intolerance will decrease Outcome: Adequate for Discharge   Problem: Nutrition: Goal: Adequate nutrition will be maintained Outcome: Adequate for Discharge   Problem: Coping: Goal: Level of anxiety will decrease Outcome: Adequate for Discharge   Problem:  Elimination: Goal: Will not experience complications related to bowel motility Outcome: Adequate for Discharge Goal: Will not experience complications related to urinary retention Outcome: Adequate for Discharge   Problem: Pain Management: Goal: General experience of comfort will improve Outcome: Adequate for Discharge   Problem: Safety: Goal: Ability to remain free from injury will improve Outcome: Adequate for Discharge   Problem: Skin Integrity: Goal: Risk for impaired skin integrity will decrease  Outcome: Adequate for Discharge   Problem: Education: Goal: Knowledge of disease or condition will improve Outcome: Adequate for Discharge Goal: Understanding of medication regimen will improve Outcome: Adequate for Discharge Goal: Individualized Educational Video(s) Outcome: Adequate for Discharge   Problem: Activity: Goal: Ability to tolerate increased activity will improve Outcome: Adequate for Discharge   Problem: Cardiac: Goal: Ability to achieve and maintain adequate cardiopulmonary perfusion will improve Outcome: Adequate for Discharge   Problem: Health Behavior/Discharge Planning: Goal: Ability to safely manage health-related needs after discharge will improve Outcome: Adequate for Discharge

## 2023-03-30 NOTE — Progress Notes (Signed)
 Mobility Specialist - Progress Note   03/30/23 1400  Mobility  Activity Stood at bedside;Ambulated with assistance in room  Level of Assistance Contact guard assist, steadying assist  Assistive Device None  Distance Ambulated (ft) 2 ft  Activity Response Tolerated well  Mobility visit 1 Mobility     Pt lying in bed upon arrival, utilizing RA. Pt attempting to don personal briefs and requested assistance. Pt achieved EOB sitting with CGA. STS and lateral steps towards HOB with CGA. Unsteady and dizzy. Pt returned to bed with alarm set, needs in reach.    Lennette Seip Mobility Specialist 03/30/23, 2:19 PM

## 2023-03-30 NOTE — TOC Progression Note (Addendum)
 Transition of Care [facility]) - Progression Note    Patient Details  Name: Nicholas Weiss MRN: [id number] Date of Birth: 03-07-1951  [facility] CM/SW Contact  Lauraine JAYSON Carpen, LCSW Phone Number: 03/30/2023, 10:50 AM  Clinical Narrative:  SNF insurance authorization is still pending.   2:38 pm: [facility] is requesting an updated PT note. One of the physical therapists will reach out to see who can see him.  4:03 pm: Uploaded today's PT note into [facility] portal.  Expected Discharge Plan: Skilled Nursing Facility Barriers to Discharge: Continued Medical Work up  Expected Discharge Plan and Services         Expected Discharge Date: 03/30/23                                     Social Determinants of Health (SDOH) Interventions SDOH Screenings   Food Insecurity: Patient Unable To Answer (03/27/2023)  Housing: Patient Unable To Answer (03/27/2023)  Transportation Needs: Patient Unable To Answer (03/27/2023)  Utilities: Patient Unable To Answer (03/27/2023)  Alcohol Screen: Low Risk  (10/02/2020)  Depression (PHQ2-9): Medium Risk (10/02/2020)  Financial Resource Strain: Low Risk  (01/26/2023)   [facility] from [facility]  Physical Activity: Insufficiently Active (01/01/2020)  Social Connections: Moderately Isolated (03/30/2023)  Stress: No Stress Concern Present (01/01/2020)  Tobacco Use: Low Risk  (03/27/2023)  Recent Concern: Tobacco Use - Medium Risk (02/15/2023)   Received from [facility]    Readmission Risk Interventions    02/16/2023    3:56 PM 08/24/2022   10:57 AM 05/20/2022   11:41 AM  Readmission Risk Prevention Plan  Transportation Screening Complete Complete Complete  Medication Review Oceanographer) Complete Complete Complete  PCP or Specialist appointment within 3-5 days of discharge Complete Complete Complete  HRI or Home Care Consult Complete Complete Complete  SW Recovery Care/Counseling Consult Not  Complete    Palliative Care Screening Not Applicable  Complete  Skilled Nursing Facility Complete Complete Complete

## 2023-03-31 LAB — GLUCOSE, CAPILLARY
Glucose-Capillary: 29 mg/dL — CL (ref 70–99)
Glucose-Capillary: 68 mg/dL — ABNORMAL LOW (ref 70–99)
Glucose-Capillary: 93 mg/dL (ref 70–99)
Glucose-Capillary: 154 mg/dL — ABNORMAL HIGH (ref 70–99)

## 2023-03-31 LAB — CULTURE, BLOOD (SINGLE)
Culture: NO GROWTH
Culture: NO GROWTH
Special Requests: ADEQUATE
Special Requests: ADEQUATE

## 2023-03-31 MED ORDER — INSULIN GLARGINE-YFGN 100 UNIT/ML ~~LOC~~ SOLN: 5.0000 [IU] | Freq: Every day | SUBCUTANEOUS | Status: DC

## 2023-03-31 MED ORDER — INSULIN GLARGINE-YFGN 100 UNIT/ML ~~LOC~~ SOLN
5.0000 [IU] | Freq: Every day | SUBCUTANEOUS | Status: DC
  Filled 2023-03-31: qty 0.05

## 2023-03-31 NOTE — TOC Transition Note (Signed)
 Transition of Care [facility]) - Discharge Note   Patient Details  Name: Nicholas Weiss MRN: [id number] Date of Birth: [birth year]  Transition of [facility]) CM/SW Contact:  Corean ONEIDA Haddock, RN Phone Number: 03/31/2023, 11:52 AM   Clinical Narrative:     Insurance auth obtained  Patient will DC to: [facility]  Anticipated DC date: 03/31/23  Family notified: [name] by: ALBERTO  Per MD patient ready for DC to . RN, patient, patient's family, and facility notified of DC. Discharge Summary sent to facility. RN given number for report. DC packet on chart. Ambulance transport requested for patient.  TOC signing off.     Barriers to Discharge: Continued Medical Work up   Patient Goals and CMS Choice            Discharge Placement                       Discharge Plan and Services Additional resources added to the After Visit Summary for                                       Social Drivers of Health (SDOH) Interventions SDOH Screenings   Food Insecurity: Patient Unable To Answer (03/27/2023)  Housing: Patient Unable To Answer (03/27/2023)  Transportation Needs: Patient Unable To Answer (03/27/2023)  Utilities: Patient Unable To Answer (03/27/2023)  Alcohol Screen: Low Risk  (10/02/2020)  Depression (PHQ2-9): Medium Risk (10/02/2020)  Financial Resource Strain: Low Risk  (01/26/2023)   Received from [facility]  Physical Activity: Insufficiently Active (01/01/2020)  Social Connections: Moderately Isolated (03/30/2023)  Stress: No Stress Concern Present (01/01/2020)  Tobacco Use: Low Risk  (03/27/2023)  Recent Concern: Tobacco Use - Medium Risk (02/15/2023)   Received from [facility]     Readmission Risk Interventions    02/16/2023    3:56 PM 08/24/2022   10:57 AM 05/20/2022   11:41 AM  Readmission Risk Prevention Plan  Transportation Screening Complete Complete Complete  Medication Review Special Educational Needs Teacher) Complete Complete Complete  PCP or Specialist appointment within 3-5 days of discharge Complete Complete Complete  HRI or Home Care Consult Complete Complete Complete  SW Recovery Care/Counseling Consult Not Complete    Palliative Care Screening Not Applicable  Complete  Skilled Nursing Facility Complete Complete Complete

## 2023-03-31 NOTE — Progress Notes (Signed)
 Patient discharged to SNF via EMS.  Patient belongings and discharge paperwork sent with EMS transporters.  PIVx2 removed prior to discharge.  Family notified by case worker.  Report given to facility.

## 2023-03-31 NOTE — Progress Notes (Signed)
 Patient alert and oriented with delayed responses.  BG 27 at 0751.  OJ given and BG rechecked at 0807 BG 68.  OJ given and set up for breakfast . 0851 BG  93 on rechecked.  Patient only ate bites of breakfast.  MD notified.

## 2023-03-31 NOTE — Plan of Care (Signed)
 Patient discharged to [facility], SNF via EMS transport.

## 2023-03-31 NOTE — Plan of Care (Signed)
   Problem: Education: Goal: Knowledge of General Education information will improve Description: Including pain rating scale, medication(s)/side effects and non-pharmacologic comfort measures Outcome: Progressing   Problem: Health Behavior/Discharge Planning: Goal: Ability to manage health-related needs will improve Outcome: Progressing   Problem: Nutrition: Goal: Adequate nutrition will be maintained Outcome: Progressing

## 2023-04-01 ENCOUNTER — Encounter: Payer: Self-pay | Admitting: Oncology

## 2023-04-01 LAB — TYPE AND SCREEN
ABO/RH(D): A POS
Antibody Screen: POSITIVE
DAT, IgG: NEGATIVE
DAT, complement: NEGATIVE
Unit division: 0
Unit division: 0
Unit division: 0

## 2023-04-01 LAB — BPAM RBC
Blood Product Expiration Date: 202501102359
Blood Product Expiration Date: 202501142359
Blood Product Expiration Date: 202501172359
ISSUE DATE / TIME: 202412291529
Unit Type and Rh: 5100
Unit Type and Rh: 5100
Unit Type and Rh: 600

## 2023-04-02 ENCOUNTER — Inpatient Hospital Stay: Payer: 59

## 2023-04-02 ENCOUNTER — Inpatient Hospital Stay: Payer: 59 | Attending: Oncology

## 2023-04-02 DIAGNOSIS — D631 Anemia in chronic kidney disease: Secondary | ICD-10-CM | POA: Insufficient documentation

## 2023-04-02 DIAGNOSIS — C9000 Multiple myeloma not having achieved remission: Secondary | ICD-10-CM | POA: Insufficient documentation

## 2023-04-02 DIAGNOSIS — N1831 Chronic kidney disease, stage 3a: Secondary | ICD-10-CM | POA: Insufficient documentation

## 2023-04-04 ENCOUNTER — Other Ambulatory Visit: Payer: Self-pay

## 2023-04-05 ENCOUNTER — Inpatient Hospital Stay
Admission: EM | Admit: 2023-04-05 | Discharge: 2023-04-11 | DRG: 637 | Disposition: A | Discharge status: Home Health | Payer: 59 | Attending: Internal Medicine | Admitting: Internal Medicine

## 2023-04-05 ENCOUNTER — Other Ambulatory Visit: Payer: Self-pay

## 2023-04-05 ENCOUNTER — Emergency Department: Payer: 59

## 2023-04-05 DIAGNOSIS — N1831 Chronic kidney disease, stage 3a: Secondary | ICD-10-CM | POA: Diagnosis present

## 2023-04-05 DIAGNOSIS — Z8249 Family history of ischemic heart disease and other diseases of the circulatory system: Secondary | ICD-10-CM

## 2023-04-05 DIAGNOSIS — T68XXXA Hypothermia, initial encounter: Secondary | ICD-10-CM | POA: Diagnosis present

## 2023-04-05 DIAGNOSIS — I951 Orthostatic hypotension: Secondary | ICD-10-CM | POA: Diagnosis present

## 2023-04-05 DIAGNOSIS — Z880 Allergy status to penicillin: Secondary | ICD-10-CM

## 2023-04-05 DIAGNOSIS — N183 Chronic kidney disease, stage 3 unspecified: Secondary | ICD-10-CM | POA: Diagnosis present

## 2023-04-05 DIAGNOSIS — I480 Paroxysmal atrial fibrillation: Secondary | ICD-10-CM | POA: Diagnosis present

## 2023-04-05 DIAGNOSIS — E10649 Type 1 diabetes mellitus with hypoglycemia without coma: Principal | ICD-10-CM | POA: Diagnosis present

## 2023-04-05 DIAGNOSIS — E109 Type 1 diabetes mellitus without complications: Secondary | ICD-10-CM | POA: Diagnosis present

## 2023-04-05 DIAGNOSIS — G9341 Metabolic encephalopathy: Secondary | ICD-10-CM | POA: Diagnosis present

## 2023-04-05 DIAGNOSIS — G40909 Epilepsy, unspecified, not intractable, without status epilepticus: Secondary | ICD-10-CM | POA: Diagnosis present

## 2023-04-05 DIAGNOSIS — R4182 Altered mental status, unspecified: Principal | ICD-10-CM

## 2023-04-05 DIAGNOSIS — E1022 Type 1 diabetes mellitus with diabetic chronic kidney disease: Secondary | ICD-10-CM | POA: Diagnosis present

## 2023-04-05 DIAGNOSIS — C9000 Multiple myeloma not having achieved remission: Secondary | ICD-10-CM | POA: Diagnosis present

## 2023-04-05 DIAGNOSIS — R680 Hypothermia, not associated with low environmental temperature: Secondary | ICD-10-CM | POA: Diagnosis present

## 2023-04-05 DIAGNOSIS — E871 Hypo-osmolality and hyponatremia: Secondary | ICD-10-CM | POA: Diagnosis not present

## 2023-04-05 DIAGNOSIS — E1040 Type 1 diabetes mellitus with diabetic neuropathy, unspecified: Secondary | ICD-10-CM

## 2023-04-05 DIAGNOSIS — Z79899 Other long term (current) drug therapy: Secondary | ICD-10-CM

## 2023-04-05 DIAGNOSIS — I959 Hypotension, unspecified: Secondary | ICD-10-CM | POA: Diagnosis present

## 2023-04-05 DIAGNOSIS — Z794 Long term (current) use of insulin: Secondary | ICD-10-CM

## 2023-04-05 DIAGNOSIS — Z5982 Transportation insecurity: Secondary | ICD-10-CM

## 2023-04-05 DIAGNOSIS — D509 Iron deficiency anemia, unspecified: Secondary | ICD-10-CM | POA: Diagnosis present

## 2023-04-05 DIAGNOSIS — I129 Hypertensive chronic kidney disease with stage 1 through stage 4 chronic kidney disease, or unspecified chronic kidney disease: Secondary | ICD-10-CM | POA: Diagnosis present

## 2023-04-05 DIAGNOSIS — E162 Hypoglycemia, unspecified: Secondary | ICD-10-CM

## 2023-04-05 DIAGNOSIS — R400 Somnolence: Secondary | ICD-10-CM

## 2023-04-05 DIAGNOSIS — Z7901 Long term (current) use of anticoagulants: Secondary | ICD-10-CM

## 2023-04-05 DIAGNOSIS — Z8673 Personal history of transient ischemic attack (TIA), and cerebral infarction without residual deficits: Secondary | ICD-10-CM

## 2023-04-05 DIAGNOSIS — D631 Anemia in chronic kidney disease: Secondary | ICD-10-CM | POA: Diagnosis present

## 2023-04-05 DIAGNOSIS — W19XXXA Unspecified fall, initial encounter: Secondary | ICD-10-CM | POA: Diagnosis present

## 2023-04-05 DIAGNOSIS — E872 Acidosis, unspecified: Secondary | ICD-10-CM | POA: Diagnosis present

## 2023-04-05 DIAGNOSIS — E7800 Pure hypercholesterolemia, unspecified: Secondary | ICD-10-CM | POA: Diagnosis present

## 2023-04-05 DIAGNOSIS — I10 Essential (primary) hypertension: Secondary | ICD-10-CM | POA: Diagnosis present

## 2023-04-05 DIAGNOSIS — Z89422 Acquired absence of other left toe(s): Secondary | ICD-10-CM

## 2023-04-05 LAB — URINALYSIS, ROUTINE W REFLEX MICROSCOPIC
Bilirubin Urine: NEGATIVE
Glucose, UA: >=500 mg/dL — AB
Hgb urine dipstick: NEGATIVE
Ketones, ur: 5 mg/dL — AB
Leukocytes,Ua: NEGATIVE
Nitrite: NEGATIVE
Protein, ur: NEGATIVE mg/dL
Specific Gravity, Urine: 1.018 (ref 1.005–1.030)
pH: 5 (ref 5.0–8.0)

## 2023-04-05 LAB — COMPREHENSIVE METABOLIC PANEL
ALT: 41 U/L (ref 0–44)
AST: 48 U/L — ABNORMAL HIGH (ref 15–41)
Albumin: 2.6 g/dL — ABNORMAL LOW (ref 3.5–5.0)
Alkaline Phosphatase: 64 U/L (ref 38–126)
Anion gap: 10 (ref 5–15)
BUN: 40 mg/dL — ABNORMAL HIGH (ref 8–23)
CO2: 23 mmol/L (ref 22–32)
Calcium: 7.9 mg/dL — ABNORMAL LOW (ref 8.9–10.3)
Chloride: 107 mmol/L (ref 98–111)
Creatinine, Ser: 1.63 mg/dL — ABNORMAL HIGH (ref 0.61–1.24)
GFR, Estimated: 45 mL/min — ABNORMAL LOW (ref >60)
Glucose, Bld: 24 mg/dL — CL (ref 70–99)
Potassium: 2.9 mmol/L — ABNORMAL LOW (ref 3.5–5.1)
Sodium: 140 mmol/L (ref 135–145)
Total Bilirubin: 0.5 mg/dL (ref 0.0–1.2)
Total Protein: 6.7 g/dL (ref 6.5–8.1)

## 2023-04-05 LAB — CBC WITH DIFFERENTIAL/PLATELET
Abs Immature Granulocytes: 0.02 10*3/uL (ref 0.00–0.07)
Basophils Absolute: 0 10*3/uL (ref 0.0–0.1)
Basophils Relative: 0 %
Eosinophils Absolute: 0.1 10*3/uL (ref 0.0–0.5)
Eosinophils Relative: 1 %
HCT: 24.8 % — ABNORMAL LOW (ref 39.0–52.0)
Hemoglobin: 7.8 g/dL — ABNORMAL LOW (ref 13.0–17.0)
Immature Granulocytes: 0 %
Lymphocytes Relative: 46 %
Lymphs Abs: 3.3 10*3/uL (ref 0.7–4.0)
MCH: 31.2 pg (ref 26.0–34.0)
MCHC: 31.5 g/dL (ref 30.0–36.0)
MCV: 99.2 fL (ref 80.0–100.0)
Monocytes Absolute: 1 10*3/uL (ref 0.1–1.0)
Monocytes Relative: 13 %
Neutro Abs: 3 10*3/uL (ref 1.7–7.7)
Neutrophils Relative %: 40 %
Platelets: 337 10*3/uL (ref 150–400)
RBC: 2.5 MIL/uL — ABNORMAL LOW (ref 4.22–5.81)
RDW: 15.6 % — ABNORMAL HIGH (ref 11.5–15.5)
WBC: 7.4 10*3/uL (ref 4.0–10.5)
nRBC: 0 % (ref 0.0–0.2)

## 2023-04-05 LAB — LIPASE, BLOOD: Lipase: 25 U/L (ref 11–51)

## 2023-04-05 LAB — BLOOD GAS, ARTERIAL
Acid-base deficit: 3.3 mmol/L — ABNORMAL HIGH (ref 0.0–2.0)
Bicarbonate: 23.1 mmol/L (ref 20.0–28.0)
O2 Content: 2 L/min
O2 Saturation: 98.7 %
Patient temperature: 37
pCO2 arterial: 47 mm[Hg] (ref 32–48)
pH, Arterial: 7.3 — ABNORMAL LOW (ref 7.35–7.45)
pO2, Arterial: 130 mm[Hg] — ABNORMAL HIGH (ref 83–108)

## 2023-04-05 LAB — AMMONIA: Ammonia: 24 umol/L (ref 9–35)

## 2023-04-05 LAB — BETA-HYDROXYBUTYRIC ACID: Beta-Hydroxybutyric Acid: 0.25 mmol/L (ref 0.05–0.27)

## 2023-04-05 LAB — MAGNESIUM: Magnesium: 2 mg/dL (ref 1.7–2.4)

## 2023-04-05 LAB — TROPONIN I (HIGH SENSITIVITY): Troponin I (High Sensitivity): 18 ng/L — ABNORMAL HIGH (ref <18)

## 2023-04-05 LAB — CBG MONITORING, ED
Glucose-Capillary: 105 mg/dL — ABNORMAL HIGH (ref 70–99)
Glucose-Capillary: <10 mg/dL — CL (ref 70–99)
Glucose-Capillary: 142 mg/dL — ABNORMAL HIGH (ref 70–99)

## 2023-04-05 LAB — LACTIC ACID, PLASMA
Lactic Acid, Venous: 3.4 mmol/L (ref 0.5–1.9)
Lactic Acid, Venous: 1.5 mmol/L (ref 0.5–1.9)

## 2023-04-05 MED ORDER — POTASSIUM CHLORIDE 10 MEQ/100ML IV SOLN
10.0000 meq | INTRAVENOUS | Status: AC
  Administered 2023-04-05 – 2023-04-06 (×3): 10 meq via INTRAVENOUS
  Filled 2023-04-05 (×3): qty 100

## 2023-04-05 MED ORDER — LACTATED RINGERS IV SOLN: INTRAVENOUS | Status: DC

## 2023-04-05 MED ORDER — SODIUM CHLORIDE 0.9 % IV BOLUS
1000.0000 mL | Freq: Once | INTRAVENOUS | Status: AC
  Administered 2023-04-05: 1000 mL via INTRAVENOUS

## 2023-04-05 MED ORDER — PANTOPRAZOLE SODIUM 40 MG IV SOLR
80.0000 mg | Freq: Two times a day (BID) | INTRAVENOUS | Status: DC
  Administered 2023-04-05 – 2023-04-09 (×8): 80 mg via INTRAVENOUS
  Filled 2023-04-05 (×8): qty 20

## 2023-04-05 MED ORDER — METRONIDAZOLE 500 MG/100ML IV SOLN
500.0000 mg | Freq: Once | INTRAVENOUS | Status: AC
Start: 2023-04-05 — End: 2023-04-05
  Administered 2023-04-05: 500 mg via INTRAVENOUS
  Filled 2023-04-05: qty 100

## 2023-04-05 MED ORDER — SODIUM CHLORIDE 0.9 % IV SOLN: INTRAVENOUS | Status: DC

## 2023-04-05 MED ORDER — SODIUM CHLORIDE 0.9 % IV SOLN
2.0000 g | Freq: Once | INTRAVENOUS | Status: AC
  Administered 2023-04-05: 2 g via INTRAVENOUS
  Filled 2023-04-05: qty 12.5

## 2023-04-05 MED ORDER — DEXTROSE 50 % IV SOLN
INTRAVENOUS | Status: AC
  Administered 2023-04-05: 50 mL via INTRAVENOUS
  Filled 2023-04-05: qty 50

## 2023-04-05 MED ORDER — HYDROCORTISONE SOD SUC (PF) 100 MG IJ SOLR
100.0000 mg | Freq: Three times a day (TID) | INTRAMUSCULAR | Status: DC
  Filled 2023-04-05 (×2): qty 2

## 2023-04-05 MED ORDER — LEVETIRACETAM IN NACL 500 MG/100ML IV SOLN
500.0000 mg | Freq: Two times a day (BID) | INTRAVENOUS | Status: DC
  Administered 2023-04-06 – 2023-04-07 (×4): 500 mg via INTRAVENOUS
  Filled 2023-04-05 (×6): qty 100

## 2023-04-05 MED ORDER — THIAMINE HCL 100 MG/ML IJ SOLN
500.0000 mg | Freq: Once | INTRAVENOUS | Status: AC
  Administered 2023-04-06: 500 mg via INTRAVENOUS
  Filled 2023-04-05: qty 5

## 2023-04-05 MED ORDER — DEXTROSE-SODIUM CHLORIDE 5-0.9 % IV SOLN: INTRAVENOUS | Status: DC

## 2023-04-05 MED ORDER — SODIUM CHLORIDE 0.9 % IV BOLUS
1000.0000 mL | Freq: Once | INTRAVENOUS | Status: AC
Start: 2023-04-05 — End: 2023-04-05
  Administered 2023-04-05: 1000 mL via INTRAVENOUS

## 2023-04-05 MED ORDER — DEXTROSE 50 % IV SOLN
1.0000 | Freq: Once | INTRAVENOUS | Status: AC
  Filled 2023-04-05: qty 50

## 2023-04-05 MED ORDER — VANCOMYCIN HCL IN DEXTROSE 1-5 GM/200ML-% IV SOLN
1000.0000 mg | Freq: Once | INTRAVENOUS | Status: AC
  Administered 2023-04-05: 1000 mg via INTRAVENOUS
  Filled 2023-04-05: qty 200

## 2023-04-05 NOTE — ED Notes (Signed)
 Bare Hugger applied due to temp of 92 via Temp sensing foley

## 2023-04-05 NOTE — Assessment & Plan Note (Addendum)
 Placed on IV Keppra.  EEG.

## 2023-04-05 NOTE — Assessment & Plan Note (Addendum)
 Will transfuse a unit of blood.  Hold hydralazine.

## 2023-04-05 NOTE — ED Provider Notes (Signed)
 [facility] Provider Note    Event Date/Time   First MD Initiated Contact with Patient 04/05/23 1843     (approximate)   History   Altered Mental Status   HPI Nicholas Weiss is a 73 y.o. male with history of HTN, prior CVA, DM 1, multiple myeloma, CKD, paroxysmal A-fib on Eliquis  presenting today for altered mental status.  Seen with family at facility today where they reported him to be more lethargic than normal.  24 hours ago he was apparently near his baseline.  Now minimally communicative with them.  EMS reported mild hypotension on arrival but otherwise stable.  Will awake to verbal and physical stimuli.  Otherwise family denies any recent cough, congestion, fever, chest pain, abdominal pain, nausea, vomiting, diarrhea.  Of note, patient has frequent hospitalizations regarding metabolic encephalopathies in the past.     Physical Exam   Triage Vital Signs: ED Triage Vitals  Encounter Vitals Group     BP 04/05/23 1900 110/61     Systolic BP Percentile --      Diastolic BP Percentile --      Pulse Rate 04/05/23 1900 97     Resp 04/05/23 1900 16     Temp 04/05/23 1932 (!) 95 F (35 C)     Temp Source 04/05/23 1932 Rectal     SpO2 04/05/23 1900 100 %     Weight 04/05/23 1906 136 lb 8 oz (61.9 kg)     Height 04/05/23 1906 5' 10 (1.778 m)     Head Circumference --      Peak Flow --      Pain Score 04/05/23 1906 0     Pain Loc --      Pain Education --      Exclude from Growth Chart --     Most recent vital signs: Vitals:   04/05/23 2145 04/05/23 2215  BP: 114/70 107/64  Pulse: 61 (!) 59  Resp: (!) 0 12  Temp:    SpO2: 100% 100%   Physical Exam: I have reviewed the vital signs and nursing notes. General: Lethargic, will open eyes to verbal and physical stimuli.  Follows commands when he stays awake long enough. Head:  Atraumatic, normocephalic.   ENT:  EOM intact, PERRL. Oral mucosa is pink and moist with no lesions. Neck: Neck is  supple with full range of motion, No meningeal signs. Cardiovascular:  RRR, No murmurs. Peripheral pulses palpable and equal bilaterally. Respiratory:  Symmetrical chest wall expansion.  No rhonchi, rales, or wheezes.  Good air movement throughout.  No use of accessory muscles.   Musculoskeletal:  No cyanosis or edema. Moving extremities with full ROM Abdomen:  Soft, nontender, nondistended. Neuro:  GCS 10 (E2, V2, M6), minimally responsive.  Snoring mostly on exam.  No obvious focal neurological deficits although difficult to fully assess given lethargy.  Is able to give me thumbs up and squeeze my hands when awoken.  Is able to wiggle toes. Skin:  Warm, dry, no rash.    ED Results / Procedures / Treatments   Labs (all labs ordered are listed, but only abnormal results are displayed) Labs Reviewed  CBC WITH DIFFERENTIAL/PLATELET - Abnormal; Notable for the following components:      Result Value   RBC 2.50 (*)    Hemoglobin 7.8 (*)    HCT 24.8 (*)    RDW 15.6 (*)    All other components within normal limits  COMPREHENSIVE METABOLIC PANEL - Abnormal;  Notable for the following components:   Potassium 2.9 (*)    Glucose, Bld 24 (*)    BUN 40 (*)    Creatinine, Ser 1.63 (*)    Calcium  7.9 (*)    Albumin 2.6 (*)    AST 48 (*)    GFR, Estimated 45 (*)    All other components within normal limits  URINALYSIS, ROUTINE W REFLEX MICROSCOPIC - Abnormal; Notable for the following components:   Color, Urine YELLOW (*)    APPearance CLEAR (*)    Glucose, UA >=500 (*)    Ketones, ur 5 (*)    Bacteria, UA RARE (*)    All other components within normal limits  LACTIC ACID, PLASMA - Abnormal; Notable for the following components:   Lactic Acid, Venous 3.4 (*)    All other components within normal limits  BLOOD GAS, ARTERIAL - Abnormal; Notable for the following components:   pH, Arterial 7.3 (*)    pO2, Arterial 130 (*)    Acid-base deficit 3.3 (*)    All other components within normal  limits  CBG MONITORING, ED - Abnormal; Notable for the following components:   Glucose-Capillary <10 (*)    All other components within normal limits  CBG MONITORING, ED - Abnormal; Notable for the following components:   Glucose-Capillary 105 (*)    All other components within normal limits  CBG MONITORING, ED - Abnormal; Notable for the following components:   Glucose-Capillary 142 (*)    All other components within normal limits  TROPONIN I (HIGH SENSITIVITY) - Abnormal; Notable for the following components:   Troponin I (High Sensitivity) 18 (*)    All other components within normal limits  CULTURE, BLOOD (ROUTINE X 2)  CULTURE, BLOOD (ROUTINE X 2)  LIPASE, BLOOD  BETA-HYDROXYBUTYRIC ACID  AMMONIA   MAGNESIUM   LACTIC ACID, PLASMA  LACTIC ACID, PLASMA  PROTIME-INR  CK  BASIC METABOLIC PANEL  VITAMIN B12  ETHANOL  LACTIC ACID, PLASMA  LACTIC ACID, PLASMA  TYPE AND SCREEN  TROPONIN I (HIGH SENSITIVITY)     EKG My EKG interpretation: Rate of 86, normal axis, normal intervals.  No acute ST elevations or depressions   RADIOLOGY Independently interpreted chest x-ray and CT head with no acute pathology   PROCEDURES:  Critical Care performed: Yes, see critical care procedure note(s)  .Critical Care  Performed by: Malvina Alm DASEN, MD Authorized by: Malvina Alm DASEN, MD   Critical care provider statement:    Critical care time (minutes):  30   Critical care was necessary to treat or prevent imminent or life-threatening deterioration of the following conditions:  Metabolic crisis (Hypoglycemia with profound lethargy)   Critical care was time spent personally by me on the following activities:  Development of treatment plan with patient or surrogate, discussions with consultants, evaluation of patient's response to treatment, examination of patient, ordering and review of laboratory studies, ordering and review of radiographic studies, ordering and performing treatments and  interventions, pulse oximetry, re-evaluation of patient's condition and review of old charts    MEDICATIONS ORDERED IN ED: Medications  lactated ringers  infusion (has no administration in time range)  thiamine  (VITAMIN B1) 500 mg in sodium chloride  0.9 % 50 mL IVPB (has no administration in time range)  pantoprazole  (PROTONIX ) injection 80 mg (80 mg Intravenous Given 04/05/23 2239)  dextrose  5 %-0.9 % sodium chloride  infusion (has no administration in time range)  potassium chloride  10 mEq in 100 mL IVPB (10 mEq Intravenous New Bag/Given 04/05/23  2240)  sodium chloride  0.9 % bolus 1,000 mL (0 mLs Intravenous Stopped 04/05/23 2127)  ceFEPIme  (MAXIPIME ) 2 g in sodium chloride  0.9 % 100 mL IVPB (0 g Intravenous Stopped 04/05/23 2157)  metroNIDAZOLE  (FLAGYL ) IVPB 500 mg (0 mg Intravenous Stopped 04/05/23 2157)  vancomycin  (VANCOCIN ) IVPB 1000 mg/200 mL premix (1,000 mg Intravenous New Bag/Given 04/05/23 2157)  dextrose  50 % solution 50 mL (50 mLs Intravenous Given 04/05/23 2137)  sodium chloride  0.9 % bolus 1,000 mL (0 mLs Intravenous Stopped 04/05/23 2216)     IMPRESSION / MDM / ASSESSMENT AND PLAN / ED COURSE  I reviewed the triage vital signs and the nursing notes.                              Differential diagnosis includes, but is not limited to, metabolic encephalopathy, hypoglycemia, sepsis, anemia, UTI, dehydration  Patient's presentation is most consistent with acute presentation with potential threat to life or bodily function.  Patient is a 73 year old male presenting today for lethargy from his facility.  Was found to have a blood sugar of 24.  Given dextrose  with no significant change in his mental status.  Also hypotensive on arrival initially given 1 L fluid.  I had additional episode of hypotension and second liter of fluid was given and started on maintenance fluid at that time.  No other acute abnormalities noted on physical exam outside of his mental status.  Laboratory workup shows  anemia consistent with his baseline.  No evidence of leukocytosis.  UA without infection.  Creatinine comparable to his baseline.  Mild hypokalemia.  Lactic elevated at 3.4.  Given lactic acidosis and hypotension, started on broad-spectrum antibiotics and blood cultures were obtained.  Patient continued to have lethargic mental status even with normalized blood sugar.  Patient admitted to hospitalist for further care.  The patient is on the cardiac monitor to evaluate for evidence of arrhythmia and/or significant heart rate changes. Clinical Course as of 04/05/23 2327  Mon Apr 05, 2023  1905 Hemoglobin(!): 7.8 Anemia similar to prior levels [DW]  1942 Glucose(!!): 24 Will give amp of D50 at this time as long as recheck is consistent [DW]  2327 Admit by Dr. Tobie [DW]    Clinical Course User Index [DW] Malvina Alm DASEN, MD     FINAL CLINICAL IMPRESSION(S) / ED DIAGNOSES   Final diagnoses:  Altered mental status, unspecified altered mental status type  Hypoglycemia     Rx / DC Orders   ED Discharge Orders     None        Note:  This document was prepared using Dragon voice recognition software and may include unintentional dictation errors.   Malvina Alm DASEN, MD 04/05/23 [phone]

## 2023-04-05 NOTE — H&P (Signed)
 History and Physical    Patient: Nicholas Weiss FMW:[id number] DOB: 1951/02/25 DOA: 04/05/2023 DOS: the patient was seen and examined on 04/06/2023 PCP: Alla Amis, MD  Patient coming from: Home Chief complaint: Chief Complaint  Patient presents with   Altered Mental Status   HPI:  Nicholas Weiss is a 73 y.o. male with past medical history  of alcohol abuse, cocaine abuseHTN, prior CVA, DM 1, multiple myeloma, CKD, paroxysmal A-fib on Eliquis  presenting today for altered mental status.  Per report patient noted to be lethargic at his [facility] facility.  Coming to the emergency room patient has been bradycardic, hypotensive patient was noted to be awake and responding to physical stimuli and verbal stimuli answering questions.  HPI as per chart review patient is minimally able to answer questions he is very somnolent.  While discussing case with EDMD ordered magnesium  level, ABG, ethanol level, urine drug screen.  Patient noted to be extremely hypoglycemic.  Patient was given amp of D50.  Patient was then started on dextrose  drip.  Discussed with EDMD about patient dispo and if he is appropriate for telemetry he said that patient is currently stable and is protecting his airway and is appropriate on the floor with neurochecks and Accu-Cheks. Pt was d/c on 12/31 and had hypoglycemic event with BGT of 29, where he was given juice and it resolved.  At bedside patient is awake to communication and is oriented states he does not know what happened he does not drink alcohol anymore.  He is not in any pain and is cooperative and follows commands is able to move his hands and likes.  States that he takes his own medications.  Would like something to drink. ED Course: In emergency room vitals trend shows: Hypothermia, respiratory rate that was initially 9 has improved patient presenting with sepsis like presentation, requested cultures in the ED which were collected. Vitals:   04/05/23 2354 04/06/23 0035  04/06/23 0045 04/06/23 0051  BP:   (!) 143/95   Pulse:   72   Temp: (!) 93 F (33.9 C) (!) 93.6 F (34.2 C) (!) 94 F (34.4 C) (!) 94.5 F (34.7 C)  Resp:   11   Height:      Weight:      SpO2:   100%   TempSrc: Core (Comment) Core (Comment)  Core (Comment)  BMI (Calculated):       Evaluation so far shows: Initial EKG shows sinus rhythm at 86 normal axis intervals and no ST-T wave changes. Head CT negative for any acute intracranial pathology. ABG requested showed pH of 7.3 pO2 of 130. Metabolic panel showed potassium of 2.9, glucose of 24, BUN of 40, CKD stage IIIa, with a creatinine of 1.63 EGFR of 45, AST of 48, normal ammonia , normal magnesium , troponin of 18. Lactic acid of 3.4.  Beta hydroxy of 0.25. CBC shows anemia that is chronic hemoglobin today is lower than his previous hemoglobin of 8.4 today is 7.8 RDW elevated at 15.6 and normal white count platelets 337. Sepsis code activated and patient was given bank cefepime  and Flagyl  which we will continue.  Cultures collected in the ED. Patient also given Solu-Cortef  100 mg x 1.   In the ED pt received: Medications  lactated ringers  infusion (has no administration in time range)  vancomycin  (VANCOCIN ) IVPB 1000 mg/200 mL premix (1,000 mg Intravenous New Bag/Given 04/05/23 2157)  thiamine  (VITAMIN B1) 500 mg in sodium chloride  0.9 % 50 mL IVPB (has no administration  in time range)  pantoprazole  (PROTONIX ) injection 80 mg (has no administration in time range)  dextrose  5 %-0.9 % sodium chloride  infusion (has no administration in time range)  sodium chloride  0.9 % bolus 1,000 mL (0 mLs Intravenous Stopped 04/05/23 2127)  ceFEPIme  (MAXIPIME ) 2 g in sodium chloride  0.9 % 100 mL IVPB (0 g Intravenous Stopped 04/05/23 2157)  metroNIDAZOLE  (FLAGYL ) IVPB 500 mg (0 mg Intravenous Stopped 04/05/23 2157)  dextrose  50 % solution 50 mL (50 mLs Intravenous Given 04/05/23 2137)  sodium chloride  0.9 % bolus 1,000 mL (1,000 mLs Intravenous New  Bag/Given 04/05/23 2137)     Review of Systems  Endo/Heme/Allergies:        Pt is asking for something to drink.    All other systems reviewed and are negative.  Past Medical History:  Diagnosis Date   Acute metabolic encephalopathy 12/02/2020   DKA (diabetic ketoacidoses) 04/06/2016   Hypercholesteremia    Hypertension    Past Surgical History:  Procedure Laterality Date   AMPUTATION TOE Left 04/25/2022   Procedure: LEFT GREAT TOE AMPUTATION AND LEFT PARTIAL 2ND TOE AMPUTATION;  Surgeon: Janit Thresa HERO, DPM;  Location: [facility] ORS;  Service: Podiatry;  Laterality: Left;   COLONOSCOPY WITH PROPOFOL  N/A 02/01/2020   Procedure: COLONOSCOPY WITH PROPOFOL ;  Surgeon: Unk Corinn Skiff, MD;  Location: [facility] ENDOSCOPY;  Service: Gastroenterology;  Laterality: N/A;   ESOPHAGOGASTRODUODENOSCOPY  02/01/2020   Procedure: ESOPHAGOGASTRODUODENOSCOPY (EGD);  Surgeon: Unk Corinn Skiff, MD;  Location: [facility] ENDOSCOPY;  Service: Gastroenterology;;   ESOPHAGOGASTRODUODENOSCOPY (EGD) WITH PROPOFOL  N/A 06/07/2022   Procedure: ESOPHAGOGASTRODUODENOSCOPY (EGD) WITH PROPOFOL ;  Surgeon: Saintclair Jasper, MD;  Location: [facility] ENDOSCOPY;  Service: Gastroenterology;  Laterality: N/A;   KNEE SURGERY Right    Torn meniscus   KNEE SURGERY Left     reports that he has never smoked. He has never used smokeless tobacco. He reports that he does not currently use alcohol. He reports current drug use. Drugs: Marijuana and Crack cocaine.  Allergies  Allergen Reactions   Penicillins Other (See Comments)    Childhood allergy -tolerated amoxil  03/2022 Unknown reaction Has patient had a PCN reaction causing immediate rash, facial/tongue/throat swelling, SOB or lightheadedness with hypotension: No Has patient had a PCN reaction causing severe rash involving mucus membranes or skin necrosis: No Has patient had a PCN reaction that required hospitalization No Has patient had a PCN reaction occurring within the last 10 years: No If  all of the above answers are NO, then may proceed with Cephalosporin use.     Family History  Problem Relation Age of Onset   Heart attack Father    Hypertension Sister    Cancer Sister     Prior to Admission medications   Medication Sig Start Date End Date Taking? Authorizing Provider  atorvastatin  (LIPITOR ) 40 MG tablet Take 40 mg by mouth daily. 01/27/23  Yes [provider]  Insulin  Aspart FlexPen (NOVOLOG ) 100 UNIT/ML Sliding Scale: Glucose 70 - 120: 0 units Glucose 121 - 150: 1 unit Glucose 151 - 200: 2 units Glucose 201 - 250: 3 units Glucose 251 - 300: 5 units Glucose 301 - 350: 7 units Glucose 351 - 400: 9 units Glucose > 400: call your docotr 03/03/23  Yes [provider]  acetaminophen  (TYLENOL ) 500 MG tablet Take 1,000 mg by mouth daily as needed for mild pain or moderate pain.    [provider]  apixaban  (ELIQUIS ) 5 MG TABS tablet Take 1 tablet (5 mg total) by mouth 2 (  two) times daily. Start Eliquis  after 2 weeks which will be 03/02/2023 03/02/23   Von Bellis, MD  ascorbic acid  (VITAMIN C ) 500 MG tablet Take 1 tablet (500 mg total) by mouth daily. 04/09/22   Amin, Sumayya, MD  atorvastatin  (LIPITOR ) 80 MG tablet Take 1 tablet (80 mg total) by mouth at bedtime. Hold while taking Paxlovid  04/08/22   Amin, Sumayya, MD  bisacodyl  5 MG EC tablet Take 1 tablet (5 mg total) by mouth at bedtime. Skip the dose if no constipation 02/24/23   Von Bellis, MD  fludrocortisone  (FLORINEF ) 0.1 MG tablet Take 1 tablet (0.1 mg total) by mouth daily. Skip the dose if SBP >130 02/25/23   Von Bellis, MD  gabapentin  (NEURONTIN ) 300 MG capsule Take 300 mg by mouth 2 (two) times daily. 03/03/23   [provider]  hydrALAZINE  (APRESOLINE ) 25 MG tablet Take 1 tablet (25 mg total) by mouth 3 (three) times daily as needed (SBP >160). 02/24/23   Von Bellis, MD  insulin  degludec (TRESIBA  FLEXTOUCH) 100 UNIT/ML FlexTouch Pen Inject 8 Units into the skin daily. Home  med. 03/30/23   Amin, Sumayya, MD  insulin  glargine-yfgn (SEMGLEE ) 100 UNIT/ML injection Inject 0.05 mLs (5 Units total) into the skin daily. 03/31/23   Lenon Marien CROME, MD  iron  polysaccharides (NIFEREX) 150 MG capsule Take 1 capsule (150 mg total) by mouth daily. 03/20/23 06/18/23  Von Bellis, MD  levETIRAcetam  (KEPPRA ) 500 MG tablet Take 1 tablet (500 mg total) by mouth 2 (two) times daily. 01/15/23   Vaslow, [name] K, MD  Multiple Vitamin (MULTIVITAMIN WITH MINERALS) TABS tablet Take 1 tablet by mouth daily. 04/09/22   Caleen Qualia, MD  NOVOLOG  100 UNIT/ML injection Sliding Scale: Glucose 70 - 120: 0 units  Glucose 121 - 150: 1 unit  Glucose 151 - 200: 2 units Glucose 201 - 250: 3 units  Glucose 251 - 300: 5 units  Glucose 301 - 350: 7 units  Glucose 351 - 400: 9 units  Glucose > 400: call your docotr 06/12/22   Cindy Garnette POUR, MD  ondansetron  (ZOFRAN ) 8 MG tablet Take 8 mg by mouth daily as needed for nausea or vomiting.    [provider]  polyethylene glycol (MIRALAX ) 17 g packet Take 17 g by mouth 2 (two) times daily. Skip the dose if no constipation 02/24/23   Von Bellis, MD  traZODone  (DESYREL ) 50 MG tablet Take 0.5 tablets (25 mg total) by mouth at bedtime as needed for sleep. 02/24/23   Von Bellis, MD  Vitamin D , Ergocalciferol , (DRISDOL ) 1.25 MG (50000 UNIT) CAPS capsule Take 1 capsule (50,000 Units total) by mouth every 7 (seven) days. 02/25/23 05/26/23  Von Bellis, MD     Vitals:   04/05/23 2354 04/06/23 0035 04/06/23 0045 04/06/23 0051  BP:   (!) 143/95   Pulse:   72   Resp:   11   Temp: (!) 93 F (33.9 C) (!) 93.6 F (34.2 C) (!) 94 F (34.4 C) (!) 94.5 F (34.7 C)  TempSrc: Core (Comment) Core (Comment)  Core (Comment)  SpO2:   100%   Weight:      Height:       Physical Exam Vitals and nursing note reviewed.  Constitutional:      General: He is not in acute distress. HENT:     Head: Normocephalic and atraumatic.     Right Ear: Hearing  and external ear normal.     Left Ear: Hearing and external ear normal.  Nose: Nose normal. No nasal deformity.     Mouth/Throat:     Lips: Pink.     Tongue: No lesions.  Eyes:     General: Lids are normal.     Extraocular Movements: Extraocular movements intact.     Pupils: Pupils are equal, round, and reactive to light.  Cardiovascular:     Rate and Rhythm: Normal rate and regular rhythm.     Heart sounds: Normal heart sounds.  Pulmonary:     Effort: Pulmonary effort is normal.     Breath sounds: Normal breath sounds.  Abdominal:     General: Bowel sounds are normal. There is no distension.     Palpations: Abdomen is soft. There is no mass.     Tenderness: There is no abdominal tenderness.  Musculoskeletal:     Right lower leg: No edema.     Left lower leg: No edema.  Skin:    Comments: Skin is cool to touch.   Neurological:     General: No focal deficit present.     Mental Status: He is alert and oriented to person, place, and time.     Cranial Nerves: Cranial nerves 2-12 are intact. No dysarthria or facial asymmetry.  Psychiatric:        Attention and Perception: Attention normal.        Speech: Speech normal.        Behavior: Behavior is cooperative.     Labs on Admission: I have personally reviewed following labs and imaging studies Results for orders placed or performed during the hospital encounter of 04/05/23 (from the past 24 hours)  CBC with Differential     Status: Abnormal   Collection Time: 04/05/23  6:54 PM  Result Value Ref Range   WBC 7.4 4.0 - 10.5 K/uL   RBC 2.50 (L) 4.22 - 5.81 MIL/uL   Hemoglobin 7.8 (L) 13.0 - 17.0 g/dL   HCT 75.1 (L) 60.9 - 47.9 %   MCV 99.2 80.0 - 100.0 fL   MCH 31.2 26.0 - 34.0 pg   MCHC 31.5 30.0 - 36.0 g/dL   RDW 84.3 (H) 88.4 - 84.4 %   Platelets 337 150 - 400 K/uL   nRBC 0.0 0.0 - 0.2 %   Neutrophils Relative % 40 %   Neutro Abs 3.0 1.7 - 7.7 K/uL   Lymphocytes Relative 46 %   Lymphs Abs 3.3 0.7 - 4.0 K/uL    Monocytes Relative 13 %   Monocytes Absolute 1.0 0.1 - 1.0 K/uL   Eosinophils Relative 1 %   Eosinophils Absolute 0.1 0.0 - 0.5 K/uL   Basophils Relative 0 %   Basophils Absolute 0.0 0.0 - 0.1 K/uL   Immature Granulocytes 0 %   Abs Immature Granulocytes 0.02 0.00 - 0.07 K/uL  Troponin I (High Sensitivity)     Status: Abnormal   Collection Time: 04/05/23  6:54 PM  Result Value Ref Range   Troponin I (High Sensitivity) 18 (H) <18 ng/L  Lipase, blood     Status: None   Collection Time: 04/05/23  6:54 PM  Result Value Ref Range   Lipase 25 11 - 51 U/L  Comprehensive metabolic panel     Status: Abnormal   Collection Time: 04/05/23  6:54 PM  Result Value Ref Range   Sodium 140 135 - 145 mmol/L   Potassium 2.9 (L) 3.5 - 5.1 mmol/L   Chloride 107 98 - 111 mmol/L   CO2 23 22 - 32 mmol/L  Glucose, Bld 24 (LL) 70 - 99 mg/dL   BUN 40 (H) 8 - 23 mg/dL   Creatinine, Ser 8.36 (H) 0.61 - 1.24 mg/dL   Calcium  7.9 (L) 8.9 - 10.3 mg/dL   Total Protein 6.7 6.5 - 8.1 g/dL   Albumin 2.6 (L) 3.5 - 5.0 g/dL   AST 48 (H) 15 - 41 U/L   ALT 41 0 - 44 U/L   Alkaline Phosphatase 64 38 - 126 U/L   Total Bilirubin 0.5 0.0 - 1.2 mg/dL   GFR, Estimated 45 (L) >60 mL/min   Anion gap 10 5 - 15  Beta-hydroxybutyric acid     Status: None   Collection Time: 04/05/23  6:54 PM  Result Value Ref Range   Beta-Hydroxybutyric Acid 0.25 0.05 - 0.27 mmol/L  Ammonia      Status: None   Collection Time: 04/05/23  6:54 PM  Result Value Ref Range   Ammonia  24 9 - 35 umol/L  Lactic acid, plasma     Status: Abnormal   Collection Time: 04/05/23  6:54 PM  Result Value Ref Range   Lactic Acid, Venous 3.4 (HH) 0.5 - 1.9 mmol/L  Magnesium      Status: None   Collection Time: 04/05/23  6:54 PM  Result Value Ref Range   Magnesium  2.0 1.7 - 2.4 mg/dL  T4, free     Status: None   Collection Time: 04/05/23  6:54 PM  Result Value Ref Range   Free T4 0.68 0.61 - 1.12 ng/dL  TSH     Status: None   Collection Time: 04/05/23   6:54 PM  Result Value Ref Range   TSH 2.388 0.350 - 4.500 uIU/mL  Urinalysis, Routine w reflex microscopic -Urine, Catheterized     Status: Abnormal   Collection Time: 04/05/23  7:33 PM  Result Value Ref Range   Color, Urine YELLOW (A) YELLOW   APPearance CLEAR (A) CLEAR   Specific Gravity, Urine 1.018 1.005 - 1.030   pH 5.0 5.0 - 8.0   Glucose, UA >=500 (A) NEGATIVE mg/dL   Hgb urine dipstick NEGATIVE NEGATIVE   Bilirubin Urine NEGATIVE NEGATIVE   Ketones, ur 5 (A) NEGATIVE mg/dL   Protein, ur NEGATIVE NEGATIVE mg/dL   Nitrite NEGATIVE NEGATIVE   Leukocytes,Ua NEGATIVE NEGATIVE   RBC / HPF 0-5 0 - 5 RBC/hpf   WBC, UA 0-5 0 - 5 WBC/hpf   Bacteria, UA RARE (A) NONE SEEN   Squamous Epithelial / HPF 0-5 0 - 5 /HPF  CBG monitoring, ED     Status: Abnormal   Collection Time: 04/05/23  7:42 PM  Result Value Ref Range   Glucose-Capillary <10 (LL) 70 - 99 mg/dL  CBG monitoring, ED     Status: Abnormal   Collection Time: 04/05/23  8:24 PM  Result Value Ref Range   Glucose-Capillary 105 (H) 70 - 99 mg/dL  CBG monitoring, ED     Status: Abnormal   Collection Time: 04/05/23 10:10 PM  Result Value Ref Range   Glucose-Capillary 142 (H) 70 - 99 mg/dL  Blood gas, arterial     Status: Abnormal   Collection Time: 04/05/23 10:30 PM  Result Value Ref Range   O2 Content 2.0 L/min   Delivery systems NASAL CANNULA    pH, Arterial 7.3 (L) 7.35 - 7.45   pCO2 arterial 47 32 - 48 mmHg   pO2, Arterial 130 (H) 83 - 108 mmHg   Bicarbonate 23.1 20.0 - 28.0 mmol/L   Acid-base deficit 3.3 (  H) 0.0 - 2.0 mmol/L   O2 Saturation 98.7 %   Patient temperature 37.0    Collection site RIGHT RADIAL    Allens test (pass/fail) PASS PASS  Lactic acid, plasma     Status: None   Collection Time: 04/05/23 11:26 PM  Result Value Ref Range   Lactic Acid, Venous 1.5 0.5 - 1.9 mmol/L   *Note: Due to a large number of results and/or encounters for the requested time period, some results have not been displayed. A  complete set of results can be found in Results Review.   Recent Results (from the past 720 hours)  Culture, blood (single)     Status: None   Collection Time: 03/26/23  2:13 PM   Specimen: BLOOD  Result Value Ref Range Status   Specimen Description BLOOD LEFT ANTECUBITAL  Final   Special Requests   Final    BOTTLES DRAWN AEROBIC AND ANAEROBIC Blood Culture adequate volume   Culture   Final    NO GROWTH 5 DAYS Performed at [facility], [address] [place], [place] [address]    Report Status 03/31/2023 FINAL  Final  Resp panel by RT-PCR (RSV, Flu A&B, Covid) Anterior Nasal Swab     Status: None   Collection Time: 03/26/23  3:38 PM   Specimen: Anterior Nasal Swab  Result Value Ref Range Status   SARS Coronavirus 2 by RT PCR NEGATIVE NEGATIVE Final    Comment: (NOTE) SARS-CoV-2 target nucleic acids are NOT DETECTED.  The SARS-CoV-2 RNA is generally detectable in upper respiratory specimens during the acute phase of infection. The lowest concentration of SARS-CoV-2 viral copies this assay can detect is 138 copies/mL. A negative result does not preclude SARS-Cov-2 infection and should not be used as the sole basis for treatment or other patient management decisions. A negative result may occur with  improper specimen collection/handling, submission of specimen other than nasopharyngeal swab, presence of viral mutation(s) within the areas targeted by this assay, and inadequate number of viral copies(<138 copies/mL). A negative result must be combined with clinical observations, patient history, and epidemiological information. The expected result is Negative.  Fact Sheet for Patients:  bloggercourse.com  Fact Sheet for Healthcare Providers:  seriousbroker.it  This test is no t yet approved or cleared by the [place]  FDA and  has been authorized for detection and/or diagnosis of SARS-CoV-2 by FDA under an  Emergency Use Authorization (EUA). This EUA will remain  in effect (meaning this test can be used) for the duration of the COVID-19 declaration under Section 564(b)(1) of the Act, 21 U.S.C.section 360bbb-3(b)(1), unless the authorization is terminated  or revoked sooner.       Influenza A by PCR NEGATIVE NEGATIVE Final   Influenza B by PCR NEGATIVE NEGATIVE Final    Comment: (NOTE) The Xpert Xpress SARS-CoV-2/FLU/RSV plus assay is intended as an aid in the diagnosis of influenza from Nasopharyngeal swab specimens and should not be used as a sole basis for treatment. Nasal washings and aspirates are unacceptable for Xpert Xpress SARS-CoV-2/FLU/RSV testing.  Fact Sheet for Patients: bloggercourse.com  Fact Sheet for Healthcare Providers: seriousbroker.it  This test is not yet approved or cleared by the [place]  FDA and has been authorized for detection and/or diagnosis of SARS-CoV-2 by FDA under an Emergency Use Authorization (EUA). This EUA will remain in effect (meaning this test can be used) for the duration of the COVID-19 declaration under Section 564(b)(1) of the Act, 21 U.S.C. section 360bbb-3(b)(1), unless the  authorization is terminated or revoked.     Resp Syncytial Virus by PCR NEGATIVE NEGATIVE Final    Comment: (NOTE) Fact Sheet for Patients: bloggercourse.com  Fact Sheet for Healthcare Providers: seriousbroker.it  This test is not yet approved or cleared by the [place]  FDA and has been authorized for detection and/or diagnosis of SARS-CoV-2 by FDA under an Emergency Use Authorization (EUA). This EUA will remain in effect (meaning this test can be used) for the duration of the COVID-19 declaration under Section 564(b)(1) of the Act, 21 U.S.C. section 360bbb-3(b)(1), unless the authorization is terminated or revoked.  Performed at [facility], [address] [place], [place] [address]   Blood culture (single)     Status: None   Collection Time: 03/26/23  4:21 PM   Specimen: BLOOD  Result Value Ref Range Status   Specimen Description BLOOD BLOOD RIGHT FOREARM  Final   Special Requests   Final    BOTTLES DRAWN AEROBIC AND ANAEROBIC Blood Culture adequate volume   Culture   Final    NO GROWTH 5 DAYS Performed at [facility], [address] [place], [place] [address]    Report Status 03/31/2023 FINAL  Final  Urine Culture (for pregnant, neutropenic or urologic patients or patients with an indwelling urinary catheter)     Status: Abnormal   Collection Time: 03/26/23  6:35 PM   Specimen: Urine, Clean Catch  Result Value Ref Range Status   Specimen Description   Final    URINE, CLEAN CATCH Performed at [facility], [address] [place], [place] [address]    Special Requests   Final    NONE Performed at [facility], [address], [place], [place] [address]    Culture (A)  Final    <10,000 COLONIES/mL INSIGNIFICANT GROWTH Performed at [facility], [address], [place], [place] [address]    Report Status 03/28/2023 FINAL  Final   CBC:    Latest Ref Rng & Units 04/05/2023    6:54 PM 03/29/2023    4:55 AM 03/28/2023    6:21 PM  CBC  WBC 4.0 - 10.5 K/uL 7.4  3.7  3.1   Hemoglobin 13.0 - 17.0 g/dL 7.8  8.4  8.1   Hematocrit 39.0 - 52.0 % 24.8  25.0  24.0   Platelets 150 - 400 K/uL 337  209  200    Basic Metabolic Panel: Recent Labs  Lab 04/05/23 1854  NA 140  K 2.9*  CL 107  CO2 23  GLUCOSE 24*  BUN 40*  CREATININE 1.63*  CALCIUM  7.9*  MG 2.0   Creatinine: Lab Results  Component Value Date   CREATININE 1.63 (H) 04/05/2023   CREATININE 1.19 03/29/2023   CREATININE 1.43 (H) 03/28/2023    Liver Function Tests:    Latest Ref Rng & Units 04/05/2023    6:54 PM 03/29/2023    4:55 AM 03/28/2023    5:32 AM  Hepatic Function  Total Protein 6.5 - 8.1 g/dL  6.7     Albumin 3.5 - 5.0 g/dL 2.6  2.7  2.4   AST 15 - 41 U/L 48     ALT 0 - 44 U/L 41     Alk Phosphatase 38 - 126 U/L 64     Total Bilirubin 0.0 - 1.2 mg/dL 0.5      Coagulation Profile: No results for input(s): INR, PROTIME in the last 168 hours. Cardiac Enzymes: No results for input(s):  CKTOTAL, CKMB, CKMBINDEX, TROPONINI in the last 168 hours. BNP (last 3 results) No results for input(s): PROBNP in the last 8760 hours. HbA1C: No results for input(s): HGBA1C in the last 72 hours. Lipid Profile: No results for input(s): CHOL, HDL, LDLCALC, TRIG, CHOLHDL, LDLDIRECT in the last 72 hours. Urinalysis    Component Value Date/Time   COLORURINE YELLOW (A) 04/05/2023 1933   APPEARANCEUR CLEAR (A) 04/05/2023 1933   LABSPEC 1.018 04/05/2023 1933   PHURINE 5.0 04/05/2023 1933   GLUCOSEU >=500 (A) 04/05/2023 1933   HGBUR NEGATIVE 04/05/2023 1933   BILIRUBINUR NEGATIVE 04/05/2023 1933   KETONESUR 5 (A) 04/05/2023 1933   PROTEINUR NEGATIVE 04/05/2023 1933   NITRITE NEGATIVE 04/05/2023 1933   LEUKOCYTESUR NEGATIVE 04/05/2023 1933     Unresulted Labs (From admission, onward)     Start     Ordered   04/06/23 0500  Protime-INR  Tomorrow morning,   R        04/06/23 0104   04/06/23 0500  Cortisol-am, blood  Tomorrow morning,   R        04/06/23 0104   04/06/23 0500  Comprehensive metabolic panel  Tomorrow morning,   R        04/06/23 0104   04/06/23 0500  CBC  Tomorrow morning,   R        04/06/23 0104   04/05/23 2321  Lactic acid, plasma  (Lactic Acid)  STAT Now then every 3 hours,   STAT      04/05/23 2320   04/05/23 2311  Ethanol  Add-on,   AD        04/05/23 2311   04/05/23 2214  Vitamin B12  Add-on,   AD        04/05/23 2213   04/05/23 2212  CK  Once,   URGENT        04/05/23 2211   04/05/23 2212  Basic metabolic panel  Once,   STAT        04/05/23 2212   04/05/23 2210  Type and screen  Once,   STAT        04/05/23 2209   04/05/23 2210   Protime-INR  Once,   STAT        04/05/23 2209   04/05/23 1939  Blood culture (routine x 2)  BLOOD CULTURE X 2,   STAT      04/05/23 1939   Unscheduled  Occult blood card to lab, stool  As needed,   URGENT      04/05/23 2209            Radiological Exams on Admission: CT Head Wo Contrast Result Date: 04/05/2023 CLINICAL DATA:  Unresponsive EXAM: CT HEAD WITHOUT CONTRAST TECHNIQUE: Contiguous axial images were obtained from the base of the skull through the vertex without intravenous contrast. RADIATION DOSE REDUCTION: This exam was performed according to the departmental dose-optimization program which includes automated exposure control, adjustment of the mA and/or kV according to patient size and/or use of iterative reconstruction technique. COMPARISON:  03/26/2023 FINDINGS: Brain: No acute intracranial abnormality. Specifically, no hemorrhage, hydrocephalus, mass lesion, acute infarction, or significant intracranial injury. Mild chronic small vessel disease throughout the deep white matter. Extensive dural calcifications, stable. Vascular: No hyperdense vessel or unexpected calcification. Skull: No acute calvarial abnormality. Sinuses/Orbits: No acute findings Other: None IMPRESSION: No acute intracranial abnormality. Mild chronic small vessel disease. Electronically Signed   By: Franky Crease M.D.   On: 04/05/2023 19:24   DG Chest  Portable 1 View Result Date: 04/05/2023 CLINICAL DATA:  Unresponsive EXAM: PORTABLE CHEST 1 VIEW COMPARISON:  03/26/2023 FINDINGS: Heart and mediastinal contours are within normal limits. No focal opacities or effusions. No acute bony abnormality. Previously seen right lateral 8th and 9th rib fractures not well visualized. Aortic atherosclerosis. IMPRESSION: No active disease. Electronically Signed   By: Franky Crease M.D.   On: 04/05/2023 19:23    Data Reviewed: Relevant notes from primary care and specialist visits, past discharge summaries as available in EHR,  including Care Everywhere. Prior diagnostic testing as pertinent to current admission diagnoses, Updated medications and problem lists for reconciliation ED course, including vitals, labs, imaging, treatment and response to treatment,Triage notes, nursing and pharmacy notes and ED provider's notes Notable results as noted in HPI.Discussed case with EDMD/ ED APP/ or Specialty MD on call and as needed.  Assessment and Plan: * Somnolence Suspect that patient was hypoglycemic probably from excessive alcohol and inebriated. Requested alcohol level.  CIWA precautions, neurochecks, seizure precautions aspiration precaution fall precaution  Hypoglycemia due to type 1 diabetes mellitus (HCC) Currently continued on D5 drip . Accucheck q1 hourly.    Hypothermia Differentials include sepsis or infection related versus abuse decreased p.o. intake nutritional related, congestive heart failure CAD related. Currently patient on a Bair hugger with a temp sensing Foley.  Warmed IV fluids as deemed appropriate.  Suspect hypothermia from hypoglycemia.  Single dose hydrocortisone .  Continued on D5 normal saline.  Accu-Cheks and neurochecks.  Hypotension Will monitor patient's blood pressure, hold blood pressure meds.  Cultures have been obtained.  Do not suspect infection.  Will follow C/S.  Type 1 diabetes mellitus (HCC) Sliding scale insulin  once patient is alert and awake currently he is hypoglycemic but patient on D5 normal saline drip.  dextrose  5 % and 0.9 % NaCl     lactated ringers      potassium chloride  10 mEq (04/05/23 2240)   [START ON 04/06/2023] thiamine  (VITAMIN B1) injection       Seizure disorder (HCC) Seizure precautions, aspiration precautions, will change Keppra  to IV.  Anemia    Latest Ref Rng & Units 04/05/2023    6:54 PM 03/29/2023    4:55 AM 03/28/2023    6:21 PM  CBC  WBC 4.0 - 10.5 K/uL 7.4  3.7  3.1   Hemoglobin 13.0 - 17.0 g/dL 7.8  8.4  8.1   Hematocrit 39.0 - 52.0 %  24.8  25.0  24.0   Platelets 150 - 400 K/uL 337  209  200   Will monitor and type/screen.     CKD (chronic kidney disease) stage 3, GFR 30-59 ml/min (HCC) Lab Results  Component Value Date   CREATININE 1.63 (H) 04/05/2023   CREATININE 1.19 03/29/2023   CREATININE 1.43 (H) 03/28/2023  Monitor and follow.  Will avoid contrast studies and renally dose medications.   Paroxysmal atrial fibrillation (HCC) EKG today sinus rhythm. Eliquis  held as patient is somnolent, will resume once patient is coherent alert awake oriented and able to pass a swallow eval.  Essential (primary) hypertension Vitals:   04/05/23 1900 04/05/23 2130 04/05/23 2145 04/05/23 2215  BP: 110/61 97/68 114/70 107/64  Hypotensive.  Will currently hold patient's hydralazine .    DVT prophylaxis:  Eliquis    Consults:  None  Advance Care Planning:    Code Status: Full Code   Family Communication:  None  Disposition Plan:  Group home.  Severity of Illness: The appropriate patient status for this patient is INPATIENT. Inpatient  status is judged to be reasonable and necessary in order to provide the required intensity of service to ensure the patient's safety. The patient's presenting symptoms, physical exam findings, and initial radiographic and laboratory data in the context of their chronic comorbidities is felt to place them at high risk for further clinical deterioration. Furthermore, it is not anticipated that the patient will be medically stable for discharge from the hospital within 2 midnights of admission.   * I certify that at the point of admission it is my clinical judgment that the patient will require inpatient hospital care spanning beyond 2 midnights from the point of admission due to high intensity of service, high risk for further deterioration and high frequency of surveillance required.*  Author: Mario LULLA Blanch, MD 04/06/2023 1:05 AM  For on call review [email or web address].   Orders Placed This  Encounter  Procedures   Critical Care   Blood culture (routine x 2)   DG Chest Portable 1 View   CT Head Wo Contrast   CBC with Differential   Lipase, blood   Comprehensive metabolic panel   Beta-hydroxybutyric acid   Urinalysis, Routine w reflex microscopic -Urine, Catheterized   Ammonia    Lactic acid, plasma   Lactic acid, plasma   Protime-INR   Occult blood card to lab, stool   CK   Basic metabolic panel   Blood gas, arterial   Vitamin B12   Magnesium    Ethanol   Lactic acid, plasma   T4, free   TSH   Protime-INR   Cortisol-am, blood   Comprehensive metabolic panel   CBC   Diet regular Room service appropriate? Yes; Fluid consistency: Thin   ED Cardiac monitoring   DO NOT delay antibiotics if unable to obtain blood culture.   Neuro checks   STAT CBG when hypoglycemia is suspected. If treated, recheck every 15 minutes after each treatment until CBG >/= 70 mg/dl   Refer to Hypoglycemia Protocol Sidebar Report for treatment of CBG < 70 mg/dl   No HS correction Insulin    Maintain IV access   Vital signs   Notify physician (specify)   Refer to Sidebar Report Refer to ICU, Med-Surg, Progressive, and Step-Down Mobility Protocol Sidebars   If lactate (lactic acid) >2, verify repeat lactic acid order has been placed to be drawn   Document vital signs within 1-hour of fluid bolus completion and notify provider of bolus completion   Vital signs   Vital signs   Initiate Adult Central Line Maintenance and Catheter Protocol for patients with central line (CVC, PICC, Port, Hemodialysis, Trialysis)   Apply Sepsis Care Plan   Refer to Sidebar Report: Sepsis Bundle ED/IP   Assess and Document Glasgow Coma Scale   If patient diabetic or glucose greater than 140 notify physician for Sliding Scale Insulin  Orders   Initiate Oral Care Protocol   Initiate Carrier Fluid Protocol   RN may order General Admission PRN Orders utilizing General Admission PRN medications (through manage  orders) for the following patient needs: allergy symptoms (Claritin), cold sores (Carmex), cough (Robitussin DM), eye irritation (Liquifilm Tears), hemorrhoids (Tucks), indigestion (Maalox), minor skin irritation (Hydrocortisone  Cream), muscle pain (Ben Gay), nose irritation (saline nasal spray) and sore throat (Chloraseptic spray).   Cardiac Monitoring - Continuous Indefinite   Bed rest   Full code   Code Sepsis activation.  This occurs automatically when order is signed and prioritizes pharmacy, lab, and radiology services for STAT collections and interventions.  If [facility] downtime, call [facility] (  [phone]) to activate Code Sepsis.   monitoring by pharmacy   Consult to hospitalist   CeFEPIme  (MAXIPIME ) per pharmacy consult            vancomycin  per pharmacy consult   Pharmacy Consult   Pulse oximetry check with vital signs   Pulse oximetry (single)   CBG monitoring, ED   CBG monitoring, ED   CBG monitoring, ED   ED EKG   Type and screen   EEG adult   Insert 2nd peripheral IV if not already present.   Admit to Inpatient (patient's expected length of stay will be greater than 2 midnights or inpatient only procedure)   Aspiration precautions   Seizure precautions   Fall precautions

## 2023-04-05 NOTE — Assessment & Plan Note (Deleted)
 Vitals:   04/05/23 1900 04/05/23 2130 04/05/23 2145 04/05/23 2215  BP: 110/61 97/68 114/70 107/64  Hypotensive.  Will currently hold patient's hydralazine.

## 2023-04-05 NOTE — Sepsis Progress Note (Addendum)
 Elink monitoring for the code sepsis protocol.   2254: Notified bedside nurse of need to draw 2nd lactic acid.

## 2023-04-05 NOTE — Assessment & Plan Note (Addendum)
 EKG today sinus rhythm. Eliquis on hold with iron deficiency anemia.  Consider restarting tomorrow.Marland Kitchen

## 2023-04-05 NOTE — ED Triage Notes (Signed)
 Pt comes via [facility] via EMS. Pt wife requested facility to check pt sugar and they refused. Wife called 911 for assistance. EMS arrived and pt unresponsive to all stimuli. Pt given 500 fluids. BP at first in the 80s. Pt did have urine odor.  CBG 143  Family reports pt has been sleeping all day. T-97.9  Pt placed on 3l [place] for comfort.  Pt is clammy but does give thumbs up to MD Wells at bedside. Pt eyes open.

## 2023-04-05 NOTE — ED Notes (Signed)
 Report given to [facility]

## 2023-04-05 NOTE — Progress Notes (Signed)
 CODE SEPSIS - PHARMACY COMMUNICATION  **Broad Spectrum Antibiotics should be administered within 1 hour of Sepsis diagnosis**  Time Code Sepsis Called/Page Received: 1939  Antibiotics Ordered: cefepime , metronidazole , vanc  Time of 1st antibiotic administration: 2000  Additional action taken by pharmacy: Messaged nurse  If necessary, Name of Provider/Nurse Contacted: Victory Skates, RN    Lum VEAR Mania ,PharmD Clinical Pharmacist  04/05/2023  7:49 PM

## 2023-04-05 NOTE — Assessment & Plan Note (Addendum)
 Unclear if there is an infection.  Patient placed on empiric antibiotics.  Watch cultures.

## 2023-04-05 NOTE — Assessment & Plan Note (Deleted)
 Sliding scale insulin  once patient is alert and awake currently he is hypoglycemic but patient on D5 normal saline drip.  dextrose  5 % and 0.9 % NaCl     lactated ringers      potassium chloride  10 mEq (04/05/23 2240)   [START ON 04/06/2023] thiamine  (VITAMIN B1) injection

## 2023-04-05 NOTE — Assessment & Plan Note (Addendum)
 CKD stage IIIa.  Creatinine 1.48 with a GFR 51.

## 2023-04-05 NOTE — Assessment & Plan Note (Addendum)
 Sugar was less than 10 on fingerstick and 24 on chemistry.  Patient sugar came up today greater than 400.  Restart Semglee insulin 20 units daily +2 units of NovoLog plus sliding scale.  Hemoglobin A1c elevated at 10.7.

## 2023-04-05 NOTE — Assessment & Plan Note (Deleted)
 Suspect that patient was hypoglycemic probably from excessive alcohol and inebriated. Requested alcohol level.  CIWA precautions, neurochecks, seizure precautions aspiration precaution fall precaution

## 2023-04-06 ENCOUNTER — Ambulatory Visit: Payer: 59

## 2023-04-06 ENCOUNTER — Other Ambulatory Visit: Payer: Self-pay

## 2023-04-06 DIAGNOSIS — D631 Anemia in chronic kidney disease: Secondary | ICD-10-CM | POA: Diagnosis present

## 2023-04-06 DIAGNOSIS — G40909 Epilepsy, unspecified, not intractable, without status epilepticus: Secondary | ICD-10-CM

## 2023-04-06 DIAGNOSIS — T68XXXS Hypothermia, sequela: Secondary | ICD-10-CM

## 2023-04-06 DIAGNOSIS — Z8249 Family history of ischemic heart disease and other diseases of the circulatory system: Secondary | ICD-10-CM | POA: Diagnosis not present

## 2023-04-06 DIAGNOSIS — G9341 Metabolic encephalopathy: Secondary | ICD-10-CM | POA: Diagnosis present

## 2023-04-06 DIAGNOSIS — R680 Hypothermia, not associated with low environmental temperature: Secondary | ICD-10-CM | POA: Diagnosis present

## 2023-04-06 DIAGNOSIS — R4182 Altered mental status, unspecified: Secondary | ICD-10-CM

## 2023-04-06 DIAGNOSIS — E10649 Type 1 diabetes mellitus with hypoglycemia without coma: Secondary | ICD-10-CM | POA: Diagnosis present

## 2023-04-06 DIAGNOSIS — N1831 Chronic kidney disease, stage 3a: Secondary | ICD-10-CM

## 2023-04-06 DIAGNOSIS — I959 Hypotension, unspecified: Secondary | ICD-10-CM

## 2023-04-06 DIAGNOSIS — E7800 Pure hypercholesterolemia, unspecified: Secondary | ICD-10-CM | POA: Diagnosis present

## 2023-04-06 DIAGNOSIS — R569 Unspecified convulsions: Secondary | ICD-10-CM

## 2023-04-06 DIAGNOSIS — E871 Hypo-osmolality and hyponatremia: Secondary | ICD-10-CM | POA: Diagnosis not present

## 2023-04-06 DIAGNOSIS — I480 Paroxysmal atrial fibrillation: Secondary | ICD-10-CM | POA: Diagnosis present

## 2023-04-06 DIAGNOSIS — Z79899 Other long term (current) drug therapy: Secondary | ICD-10-CM | POA: Diagnosis not present

## 2023-04-06 DIAGNOSIS — Z8673 Personal history of transient ischemic attack (TIA), and cerebral infarction without residual deficits: Secondary | ICD-10-CM | POA: Diagnosis not present

## 2023-04-06 DIAGNOSIS — C9000 Multiple myeloma not having achieved remission: Secondary | ICD-10-CM | POA: Diagnosis present

## 2023-04-06 DIAGNOSIS — D508 Other iron deficiency anemias: Secondary | ICD-10-CM

## 2023-04-06 DIAGNOSIS — W19XXXA Unspecified fall, initial encounter: Secondary | ICD-10-CM | POA: Diagnosis present

## 2023-04-06 DIAGNOSIS — D509 Iron deficiency anemia, unspecified: Secondary | ICD-10-CM | POA: Diagnosis present

## 2023-04-06 DIAGNOSIS — Z89422 Acquired absence of other left toe(s): Secondary | ICD-10-CM | POA: Diagnosis not present

## 2023-04-06 DIAGNOSIS — E162 Hypoglycemia, unspecified: Secondary | ICD-10-CM | POA: Diagnosis present

## 2023-04-06 DIAGNOSIS — Z794 Long term (current) use of insulin: Secondary | ICD-10-CM | POA: Diagnosis not present

## 2023-04-06 DIAGNOSIS — E872 Acidosis, unspecified: Secondary | ICD-10-CM | POA: Diagnosis present

## 2023-04-06 DIAGNOSIS — Z7901 Long term (current) use of anticoagulants: Secondary | ICD-10-CM | POA: Diagnosis not present

## 2023-04-06 DIAGNOSIS — Z5982 Transportation insecurity: Secondary | ICD-10-CM | POA: Diagnosis not present

## 2023-04-06 DIAGNOSIS — E1022 Type 1 diabetes mellitus with diabetic chronic kidney disease: Secondary | ICD-10-CM | POA: Diagnosis present

## 2023-04-06 DIAGNOSIS — Z880 Allergy status to penicillin: Secondary | ICD-10-CM | POA: Diagnosis not present

## 2023-04-06 DIAGNOSIS — I129 Hypertensive chronic kidney disease with stage 1 through stage 4 chronic kidney disease, or unspecified chronic kidney disease: Secondary | ICD-10-CM | POA: Diagnosis present

## 2023-04-06 LAB — CBG MONITORING, ED
Glucose-Capillary: 451 mg/dL — ABNORMAL HIGH (ref 70–99)
Glucose-Capillary: 326 mg/dL — ABNORMAL HIGH (ref 70–99)

## 2023-04-06 LAB — CBC
HCT: 21.6 % — ABNORMAL LOW (ref 39.0–52.0)
Hemoglobin: 6.9 g/dL — ABNORMAL LOW (ref 13.0–17.0)
MCH: 31.1 pg (ref 26.0–34.0)
MCHC: 31.9 g/dL (ref 30.0–36.0)
MCV: 97.3 fL (ref 80.0–100.0)
Platelets: 230 10*3/uL (ref 150–400)
RBC: 2.22 MIL/uL — ABNORMAL LOW (ref 4.22–5.81)
RDW: 15.5 % (ref 11.5–15.5)
WBC: 5.7 10*3/uL (ref 4.0–10.5)
nRBC: 0 % (ref 0.0–0.2)

## 2023-04-06 LAB — COMPREHENSIVE METABOLIC PANEL
ALT: 34 U/L (ref 0–44)
AST: 28 U/L (ref 15–41)
Albumin: 2.4 g/dL — ABNORMAL LOW (ref 3.5–5.0)
Alkaline Phosphatase: 59 U/L (ref 38–126)
Anion gap: 9 (ref 5–15)
BUN: 41 mg/dL — ABNORMAL HIGH (ref 8–23)
CO2: 21 mmol/L — ABNORMAL LOW (ref 22–32)
Calcium: 7.5 mg/dL — ABNORMAL LOW (ref 8.9–10.3)
Chloride: 108 mmol/L (ref 98–111)
Creatinine, Ser: 1.48 mg/dL — ABNORMAL HIGH (ref 0.61–1.24)
GFR, Estimated: 51 mL/min — ABNORMAL LOW (ref >60)
Glucose, Bld: 224 mg/dL — ABNORMAL HIGH (ref 70–99)
Potassium: 4.6 mmol/L (ref 3.5–5.1)
Sodium: 138 mmol/L (ref 135–145)
Total Bilirubin: 0.5 mg/dL (ref 0.0–1.2)
Total Protein: 6.3 g/dL — ABNORMAL LOW (ref 6.5–8.1)

## 2023-04-06 LAB — PREPARE RBC (CROSSMATCH)

## 2023-04-06 LAB — VITAMIN B12: Vitamin B-12: 623 pg/mL (ref 180–914)

## 2023-04-06 LAB — LACTIC ACID, PLASMA: Lactic Acid, Venous: 1.2 mmol/L (ref 0.5–1.9)

## 2023-04-06 LAB — GLUCOSE, CAPILLARY
Glucose-Capillary: 250 mg/dL — ABNORMAL HIGH (ref 70–99)
Glucose-Capillary: 172 mg/dL — ABNORMAL HIGH (ref 70–99)

## 2023-04-06 LAB — TSH: TSH: 2.388 u[IU]/mL (ref 0.350–4.500)

## 2023-04-06 LAB — T4, FREE: Free T4: 0.68 ng/dL (ref 0.61–1.12)

## 2023-04-06 LAB — PROTIME-INR
INR: 1.5 — ABNORMAL HIGH (ref 0.8–1.2)
Prothrombin Time: 18.6 s — ABNORMAL HIGH (ref 11.4–15.2)

## 2023-04-06 LAB — ETHANOL: Alcohol, Ethyl (B): <10 mg/dL (ref <10)

## 2023-04-06 LAB — CK: Total CK: 50 U/L (ref 49–397)

## 2023-04-06 LAB — TROPONIN I (HIGH SENSITIVITY): Troponin I (High Sensitivity): 16 ng/L (ref <18)

## 2023-04-06 LAB — CORTISOL-AM, BLOOD: Cortisol - AM: 4.5 ug/dL — ABNORMAL LOW (ref 6.7–22.6)

## 2023-04-06 MED ORDER — LACTATED RINGERS IV SOLN: INTRAVENOUS | Status: DC

## 2023-04-06 MED ORDER — INSULIN ASPART 100 UNIT/ML IJ SOLN: 0.0000 [IU] | Freq: Three times a day (TID) | INTRAMUSCULAR | Status: DC

## 2023-04-06 MED ORDER — ATORVASTATIN CALCIUM 20 MG PO TABS
40.0000 mg | ORAL_TABLET | Freq: Every day | ORAL | Status: DC
  Administered 2023-04-06 – 2023-04-11 (×6): 40 mg via ORAL
  Filled 2023-04-06 (×6): qty 2

## 2023-04-06 MED ORDER — INSULIN ASPART 100 UNIT/ML IJ SOLN: 0.0000 [IU] | Freq: Every day | INTRAMUSCULAR | Status: DC

## 2023-04-06 MED ORDER — METRONIDAZOLE 500 MG/100ML IV SOLN: 500.0000 mg | Freq: Two times a day (BID) | INTRAVENOUS | Status: DC

## 2023-04-06 MED ORDER — SODIUM CHLORIDE 0.9% FLUSH
3.0000 mL | Freq: Two times a day (BID) | INTRAVENOUS | Status: DC
Start: 2023-04-06 — End: 2023-04-11
  Administered 2023-04-06 – 2023-04-11 (×11): 3 mL via INTRAVENOUS

## 2023-04-06 MED ORDER — FLUDROCORTISONE ACETATE 0.1 MG PO TABS: 0.1000 mg | ORAL_TABLET | Freq: Every day | ORAL | Status: DC

## 2023-04-06 MED ORDER — DEXTROSE-SODIUM CHLORIDE 5-0.9 % IV SOLN: INTRAVENOUS | Status: DC

## 2023-04-06 MED ORDER — SODIUM CHLORIDE 0.9 % IV SOLN: Freq: Once | INTRAVENOUS | Status: AC

## 2023-04-06 MED ORDER — METRONIDAZOLE 500 MG/100ML IV SOLN
500.0000 mg | Freq: Two times a day (BID) | INTRAVENOUS | Status: DC
  Administered 2023-04-06 – 2023-04-07 (×3): 500 mg via INTRAVENOUS
  Filled 2023-04-06 (×4): qty 100

## 2023-04-06 MED ORDER — LACTATED RINGERS IV SOLN: Freq: Once | INTRAVENOUS | Status: AC

## 2023-04-06 MED ORDER — VANCOMYCIN HCL 750 MG/150ML IV SOLN
750.0000 mg | INTRAVENOUS | Status: DC
  Administered 2023-04-06 – 2023-04-07 (×2): 750 mg via INTRAVENOUS
  Filled 2023-04-06 (×2): qty 150

## 2023-04-06 MED ORDER — INSULIN ASPART 100 UNIT/ML IJ SOLN
0.0000 [IU] | Freq: Every day | INTRAMUSCULAR | Status: DC
  Administered 2023-04-08: 3 [IU] via SUBCUTANEOUS
  Filled 2023-04-06: qty 1

## 2023-04-06 MED ORDER — INSULIN GLARGINE-YFGN 100 UNIT/ML ~~LOC~~ SOLN
3.0000 [IU] | Freq: Every day | SUBCUTANEOUS | Status: DC
  Administered 2023-04-06 – 2023-04-07 (×2): 3 [IU] via SUBCUTANEOUS
  Filled 2023-04-06 (×3): qty 0.03

## 2023-04-06 MED ORDER — HYDROCORTISONE SOD SUC (PF) 100 MG IJ SOLR
100.0000 mg | Freq: Three times a day (TID) | INTRAMUSCULAR | Status: AC
  Administered 2023-04-06: 100 mg via INTRAVENOUS
  Filled 2023-04-06: qty 2

## 2023-04-06 MED ORDER — INSULIN ASPART 100 UNIT/ML IJ SOLN
2.0000 [IU] | Freq: Three times a day (TID) | INTRAMUSCULAR | Status: DC
  Administered 2023-04-06 – 2023-04-09 (×6): 2 [IU] via SUBCUTANEOUS
  Filled 2023-04-06 (×4): qty 1

## 2023-04-06 MED ORDER — SODIUM CHLORIDE 0.9 % IV SOLN
2.0000 g | Freq: Two times a day (BID) | INTRAVENOUS | Status: DC
  Administered 2023-04-06 – 2023-04-07 (×3): 2 g via INTRAVENOUS
  Filled 2023-04-06 (×4): qty 12.5

## 2023-04-06 MED ORDER — INSULIN ASPART 100 UNIT/ML IJ SOLN
0.0000 [IU] | Freq: Three times a day (TID) | INTRAMUSCULAR | Status: DC
  Administered 2023-04-06: 4 [IU] via SUBCUTANEOUS
  Administered 2023-04-06: 6 [IU] via SUBCUTANEOUS
  Administered 2023-04-07: 3 [IU] via SUBCUTANEOUS
  Administered 2023-04-08: 2 [IU] via SUBCUTANEOUS
  Administered 2023-04-09: 1 [IU] via SUBCUTANEOUS
  Administered 2023-04-09 – 2023-04-10 (×3): 2 [IU] via SUBCUTANEOUS
  Filled 2023-04-06 (×7): qty 1

## 2023-04-06 NOTE — Inpatient Diabetes Management (Signed)
 Inpatient Diabetes Program Recommendations  AACE/ADA: New Consensus Statement on Inpatient Glycemic Control   Target Ranges:  Prepandial:   less than 140 mg/dL      Peak postprandial:   less than 180 mg/dL (1-2 hours)      Critically ill patients:  140 - 180 mg/dL    Latest Reference Range & Units 04/05/23 19:42 04/05/23 20:24 04/05/23 22:10  Glucose-Capillary 70 - 99 mg/dL <89 (LL) 894 (H) 857 (H)    Latest Reference Range & Units 04/05/23 18:54 04/06/23 05:00  Glucose 70 - 99 mg/dL 24 (LL) 775 (H)   Review of Glycemic Control  Diabetes history: DM1 Outpatient Diabetes medications: Semglee  5 units daily, Novolog  0-9 units TID with meals Current orders for Inpatient glycemic control: Solucorted 100 mg Q8H; D5 NS @ 100 ml/hr  Inpatient Diabetes Program Recommendations:    Insulin : Patient initially hypoglycemic. Lab glucose 224 mg/dl this morning. Patient has Type 1 DM and will require basal, correction, and carb coverage insulin .   Please consider ordering Semglee  3 units Q24H, CBGs Q4H, Novolog  0-6 units Q4H, and  Novolog  2 units TID with meals for meal coverage if patient eats at least 50% of meals.  IV Fluids: If patient alert and eating well, please re-evaluate if dextrose  in IV fluids are still needed.   Diet: Please consider changing diet to Carb Modified diet.  Thanks, Earnie Gainer, RN, MSN, CDCES Diabetes Coordinator Inpatient Diabetes Program [phone] (Team Pager from 8am to 5pm)

## 2023-04-06 NOTE — Hospital Course (Signed)
 73 year old man past medical history of alcohol abuse, cocaine abuse, hypertension, prior CVA, type 1 diabetes mellitus, multiple myeloma, chronic kidney disease, paroxysmal atrial fibrillation on Eliquis  presents with altered mental status.  Patient was bradycardic, hypotensive on presentation and also hypothermic and altered mental status.  Patient had a very low sugar.  1/7.  Patient agreeable to blood transfusion and hemoglobin of 6.9.  Patient sugar now above 400 starting on Semglee  insulin  sliding scale and NovoLog  prior to meals.  Patient very labile with his sugars.

## 2023-04-06 NOTE — Progress Notes (Signed)
 Pharmacy Antibiotic Note  Nicholas Weiss is a 73 y.o. male admitted on 04/05/2023 with sepsis.  Pharmacy has been consulted for Cefepime  & Vancomycin  dosing.  Plan: Cefepime  2 gm q12hr per indication & renal fxn.   Pt given Vancomycin  1000 mg once. Vancomycin  750 mg IV Q 24 hrs. Goal AUC 400-550. Expected AUC: 480.2 SCr used: 1.63, TBW 61.9 kg < IBW 73 kg  Pharmacy will continue to follow and will adjust abx dosing whenever warranted.  Temp (24hrs), Avg:93.3 F (34.1 C), Min:92 F (33.3 C), Max:95 F (35 C)   Recent Labs  Lab 04/05/23 1854 04/05/23 2326  WBC 7.4  --   CREATININE 1.63*  --   LATICACIDVEN 3.4* 1.5    Estimated Creatinine Clearance: 36.9 mL/min (A) (by C-G formula based on SCr of 1.63 mg/dL (H)).    Allergies  Allergen Reactions   Penicillins Other (See Comments)    Childhood allergy -tolerated amoxil  03/2022 Unknown reaction Has patient had a PCN reaction causing immediate rash, facial/tongue/throat swelling, SOB or lightheadedness with hypotension: No Has patient had a PCN reaction causing severe rash involving mucus membranes or skin necrosis: No Has patient had a PCN reaction that required hospitalization No Has patient had a PCN reaction occurring within the last 10 years: No If all of the above answers are NO, then may proceed with Cephalosporin use.     Antimicrobials this admission: 1/06 Cefepime  >>  1/06 Flagyl  >>  1/06 Vancomycin  >>   Microbiology results: 1/06 BCx: Pending  Thank you for allowing pharmacy to be a part of this patient's care.  Rankin CANDIE Dills, PharmD, [facility] 04/06/2023 12:25 AM

## 2023-04-06 NOTE — Assessment & Plan Note (Addendum)
 Marland Kitchen

## 2023-04-06 NOTE — Progress Notes (Signed)
 Eeg done

## 2023-04-06 NOTE — Procedures (Signed)
 Patient Name: Nicholas Weiss  MRN: [id number]  Epilepsy Attending: Arlin MALVA Krebs  Referring Physician/Provider: Tobie Mario GAILS, MD  Date: 04/06/2023 Duration: 24.52 mins  Patient history: 73yo M with ams getting eeg to evaluate for seizure.  Level of alertness: Awake, asleep  AEDs during EEG study: LEV  Technical aspects: This EEG study was done with scalp electrodes positioned according to the 10-20 International system of electrode placement. Electrical activity was reviewed with band pass filter of 1-70Hz , sensitivity of 7 uV/mm, display speed of 11mm/sec with a 60Hz  notched filter applied as appropriate. EEG data were recorded continuously and digitally stored.  Video monitoring was available and reviewed as appropriate.  Description: The posterior dominant rhythm consists of 7.5 Hz activity of moderate voltage (25-35 uV) seen predominantly in posterior head regions, symmetric and reactive to eye opening and eye closing. Sleep was characterized by sleep spindles (12 to 14 Hz), maximal frontocentral region. EEG showed continuous generalized 5 to 7 Hz theta slowing admixed with intermittent generalized 2-3 Hz delta slowing, at times with triphasic morphology. Hyperventilation and photic stimulation were not performed.     ABNORMALITY - Continuous slow, generalized  IMPRESSION: This study is suggestive of moderate diffuse encephalopathy. No seizures or epileptiform discharges were seen throughout the recording.  Elvia Aydin O Bianna Haran

## 2023-04-06 NOTE — ED Notes (Signed)
 Family at the bedside. Pt in NAD, denies any needs at this time.

## 2023-04-06 NOTE — ED Notes (Signed)
 ED TO INPATIENT HANDOFF REPORT  ED Nurse Name and Phone #: Jesyca Weisenburger [phone]  S Name/Age/Gender Nicholas Weiss 73 y.o. male Room/Bed: ED13A/ED13A  Code Status   Code Status: Full Code  Home/SNF/Other Home Patient oriented to: self, place, time, and situation Is this baseline? Yes   Triage Complete: Triage complete  Chief Complaint AMS (altered mental status) [R41.82]  Triage Note Pt comes via [facility] via EMS. Pt wife requested facility to check pt sugar and they refused. Wife called 911 for assistance. EMS arrived and pt unresponsive to all stimuli. Pt given 500 fluids. BP at first in the 80s. Pt did have urine odor.  CBG 143  Family reports pt has been sleeping all day. T-97.9  Pt placed on 3l [place] for comfort.  Pt is clammy but does give thumbs up to MD Wells at bedside. Pt eyes open.   Allergies Allergies  Allergen Reactions   Penicillins Other (See Comments)    Childhood allergy -tolerated amoxil  03/2022 Unknown reaction Has patient had a PCN reaction causing immediate rash, facial/tongue/throat swelling, SOB or lightheadedness with hypotension: No Has patient had a PCN reaction causing severe rash involving mucus membranes or skin necrosis: No Has patient had a PCN reaction that required hospitalization No Has patient had a PCN reaction occurring within the last 10 years: No If all of the above answers are NO, then may proceed with Cephalosporin use.     Level of Care/Admitting Diagnosis ED Disposition     ED Disposition  Admit   Condition  --   Comment  Hospital Area: [facility] [[id number]]  Level of Care: Progressive [102]  Admit to Progressive based on following criteria: Other see comments  Admit to Progressive based on following criteria: MULTISYSTEM THREATS such as stable sepsis, metabolic/electrolyte imbalance with or without encephalopathy that is responding to early treatment.  Comments: AMS / Hypothermia.  Covid  Evaluation: Asymptomatic - no recent exposure (last 10 days) testing not required  Diagnosis: AMS (altered mental status) [[id number]]  Admitting Physician: TOBIE MARIO LULLA DEDRA  Attending Physician: PATEL, EKTA V [[id number]  Certification:: I certify this patient will need inpatient services for at least 2 midnights  Expected Medical Readiness: 04/08/2023          B Medical/Surgery History Past Medical History:  Diagnosis Date   Acute metabolic encephalopathy 12/02/2020   DKA (diabetic ketoacidoses) 04/06/2016   Hypercholesteremia    Hypertension    Past Surgical History:  Procedure Laterality Date   AMPUTATION TOE Left 04/25/2022   Procedure: LEFT GREAT TOE AMPUTATION AND LEFT PARTIAL 2ND TOE AMPUTATION;  Surgeon: Janit Thresa HERO, DPM;  Location: [facility] ORS;  Service: Podiatry;  Laterality: Left;   COLONOSCOPY WITH PROPOFOL  N/A 02/01/2020   Procedure: COLONOSCOPY WITH PROPOFOL ;  Surgeon: Unk Corinn Skiff, MD;  Location: [facility] ENDOSCOPY;  Service: Gastroenterology;  Laterality: N/A;   ESOPHAGOGASTRODUODENOSCOPY  02/01/2020   Procedure: ESOPHAGOGASTRODUODENOSCOPY (EGD);  Surgeon: Unk Corinn Skiff, MD;  Location: [facility] ENDOSCOPY;  Service: Gastroenterology;;   ESOPHAGOGASTRODUODENOSCOPY (EGD) WITH PROPOFOL  N/A 06/07/2022   Procedure: ESOPHAGOGASTRODUODENOSCOPY (EGD) WITH PROPOFOL ;  Surgeon: Saintclair Jasper, MD;  Location: [facility] ENDOSCOPY;  Service: Gastroenterology;  Laterality: N/A;   KNEE SURGERY Right    Torn meniscus   KNEE SURGERY Left      A IV Location/Drains/Wounds Patient Lines/Drains/Airways Status     Active Line/Drains/Airways     Name Placement date Placement time Site Days   Peripheral IV 04/05/23 20 G Left Hand 04/05/23  1909  Hand  1   Peripheral IV 04/05/23 20 G Right Hand 04/05/23  1909  Hand  1   Peripheral IV 04/05/23 24 G Right Arm 04/05/23  1909  Arm  1   Urethral Catheter Henry RN Straight-tip 14 Fr. 04/05/23  2327  Straight-tip  1            Intake/Output  Last 24 hours  Intake/Output Summary (Last 24 hours) at 04/06/2023 1544 Last data filed at 04/06/2023 0325 Gross per 24 hour  Intake 2700 ml  Output --  Net 2700 ml    Labs/Imaging Results for orders placed or performed during the hospital encounter of 04/05/23 (from the past 48 hours)  CBC with Differential     Status: Abnormal   Collection Time: 04/05/23  6:54 PM  Result Value Ref Range   WBC 7.4 4.0 - 10.5 K/uL   RBC 2.50 (L) 4.22 - 5.81 MIL/uL   Hemoglobin 7.8 (L) 13.0 - 17.0 g/dL   HCT 75.1 (L) 60.9 - 47.9 %   MCV 99.2 80.0 - 100.0 fL   MCH 31.2 26.0 - 34.0 pg   MCHC 31.5 30.0 - 36.0 g/dL   RDW 84.3 (H) 88.4 - 84.4 %   Platelets 337 150 - 400 K/uL   nRBC 0.0 0.0 - 0.2 %   Neutrophils Relative % 40 %   Neutro Abs 3.0 1.7 - 7.7 K/uL   Lymphocytes Relative 46 %   Lymphs Abs 3.3 0.7 - 4.0 K/uL   Monocytes Relative 13 %   Monocytes Absolute 1.0 0.1 - 1.0 K/uL   Eosinophils Relative 1 %   Eosinophils Absolute 0.1 0.0 - 0.5 K/uL   Basophils Relative 0 %   Basophils Absolute 0.0 0.0 - 0.1 K/uL   Immature Granulocytes 0 %   Abs Immature Granulocytes 0.02 0.00 - 0.07 K/uL    Comment: Performed at [facility], [address] [place], [place] [address]  Troponin I (High Sensitivity)     Status: Abnormal   Collection Time: 04/05/23  6:54 PM  Result Value Ref Range   Troponin I (High Sensitivity) 18 (H) <18 ng/L    Comment: (NOTE) Elevated high sensitivity troponin I (hsTnI) values and significant  changes across serial measurements may suggest ACS but many other  chronic and acute conditions are known to elevate hsTnI results.  Refer to the Links section for chest pain algorithms and additional  guidance. Performed at [facility], [address] [place], [place] [address]   Lipase, blood     Status: None   Collection Time: 04/05/23  6:54 PM  Result Value Ref Range   Lipase 25 11 - 51 U/L    Comment: Performed at [facility], [address] [place], [place] [address]  Comprehensive metabolic panel     Status: Abnormal   Collection Time: 04/05/23  6:54 PM  Result Value Ref Range   Sodium 140 135 - 145 mmol/L   Potassium 2.9 (L) 3.5 - 5.1 mmol/L   Chloride 107 98 - 111 mmol/L   CO2 23 22 - 32 mmol/L   Glucose, Bld 24 (LL) 70 - 99 mg/dL    Comment: CRITICAL RESULT CALLED TO, READ BACK BY AND VERIFIED WITH HENRY AUSTIN @1942  ON 04/05/23 SKL Glucose reference range applies only to samples taken after fasting for at least 8 hours.    BUN 40 (H) 8 - 23 mg/dL   Creatinine, Ser 8.36 (H) 0.61 - 1.24 mg/dL  Calcium  7.9 (L) 8.9 - 10.3 mg/dL   Total Protein 6.7 6.5 - 8.1 g/dL   Albumin 2.6 (L) 3.5 - 5.0 g/dL   AST 48 (H) 15 - 41 U/L   ALT 41 0 - 44 U/L   Alkaline Phosphatase 64 38 - 126 U/L   Total Bilirubin 0.5 0.0 - 1.2 mg/dL   GFR, Estimated 45 (L) >60 mL/min    Comment: (NOTE) Calculated using the CKD-EPI Creatinine Equation (2021)    Anion gap 10 5 - 15    Comment: Performed at [facility], [address] [place], [place] [address]  Beta-hydroxybutyric acid     Status: None   Collection Time: 04/05/23  6:54 PM  Result Value Ref Range   Beta-Hydroxybutyric Acid 0.25 0.05 - 0.27 mmol/L    Comment: Performed at [facility], [address] [place], [place] [address]  Ammonia      Status: None   Collection Time: 04/05/23  6:54 PM  Result Value Ref Range   Ammonia  24 9 - 35 umol/L    Comment: Performed at [facility], [address] [place], [place] [address]  Lactic acid, plasma     Status: Abnormal   Collection Time: 04/05/23  6:54 PM  Result Value Ref Range   Lactic Acid, Venous 3.4 (HH) 0.5 - 1.9 mmol/L    Comment: CRITICAL RESULT CALLED TO, READ BACK BY AND VERIFIED WITH HENRY AUSTIN @1934  ON 04/05/23 SKL Performed at [facility], [address] [place], [place] [address]   Magnesium      Status: None   Collection Time: 04/05/23  6:54 PM  Result Value Ref Range    Magnesium  2.0 1.7 - 2.4 mg/dL    Comment: Performed at [facility], [address] [place], [place] [address]  T4, free     Status: None   Collection Time: 04/05/23  6:54 PM  Result Value Ref Range   Free T4 0.68 0.61 - 1.12 ng/dL    Comment: (NOTE) Biotin ingestion may interfere with free T4 tests. If the results are inconsistent with the TSH level, previous test results, or the clinical presentation, then consider biotin interference. If needed, order repeat testing after stopping biotin. Performed at [facility], [address] [place], [place] [address]   TSH     Status: None   Collection Time: 04/05/23  6:54 PM  Result Value Ref Range   TSH 2.388 0.350 - 4.500 uIU/mL    Comment: Performed by a 3rd Generation assay with a functional sensitivity of <=0.01 uIU/mL. Performed at [facility], [address] [place], [place] [address]   Urinalysis, Routine w reflex microscopic -Urine, Catheterized     Status: Abnormal   Collection Time: 04/05/23  7:33 PM  Result Value Ref Range   Color, Urine YELLOW (A) YELLOW   APPearance CLEAR (A) CLEAR   Specific Gravity, Urine 1.018 1.005 - 1.030   pH 5.0 5.0 - 8.0   Glucose, UA >=500 (A) NEGATIVE mg/dL   Hgb urine dipstick NEGATIVE NEGATIVE   Bilirubin Urine NEGATIVE NEGATIVE   Ketones, ur 5 (A) NEGATIVE mg/dL   Protein, ur NEGATIVE NEGATIVE mg/dL   Nitrite NEGATIVE NEGATIVE   Leukocytes,Ua NEGATIVE NEGATIVE   RBC / HPF 0-5 0 - 5 RBC/hpf   WBC, UA 0-5 0 - 5 WBC/hpf   Bacteria, UA RARE (A) NONE SEEN   Squamous Epithelial / HPF 0-5 0 - 5 /HPF    Comment: Performed at  [facility], [address], [place], [place] [address]  Blood culture (routine x 2)     Status: None (Preliminary result)   Collection Time: 04/05/23  7:35 PM   Specimen: BLOOD  Result Value Ref Range   Specimen Description BLOOD BLOOD RIGHT HAND    Special Requests      BOTTLES DRAWN AEROBIC AND ANAEROBIC Blood Culture  results may not be optimal due to an inadequate volume of blood received in culture bottles   Culture      NO GROWTH < 12 HOURS Performed at [facility], [address], [place], [place] [address]    Report Status PENDING   CBG monitoring, ED     Status: Abnormal   Collection Time: 04/05/23  7:42 PM  Result Value Ref Range   Glucose-Capillary <10 (LL) 70 - 99 mg/dL    Comment: Glucose reference range applies only to samples taken after fasting for at least 8 hours.  Blood culture (routine x 2)     Status: None (Preliminary result)   Collection Time: 04/05/23  7:45 PM   Specimen: BLOOD  Result Value Ref Range   Specimen Description BLOOD BLOOD LEFT HAND    Special Requests      BOTTLES DRAWN AEROBIC AND ANAEROBIC Blood Culture results may not be optimal due to an inadequate volume of blood received in culture bottles   Culture      NO GROWTH < 12 HOURS Performed at [facility], [address], [place], [place] [address]    Report Status PENDING   CBG monitoring, ED     Status: Abnormal   Collection Time: 04/05/23  8:24 PM  Result Value Ref Range   Glucose-Capillary 105 (H) 70 - 99 mg/dL    Comment: Glucose reference range applies only to samples taken after fasting for at least 8 hours.  CBG monitoring, ED     Status: Abnormal   Collection Time: 04/05/23 10:10 PM  Result Value Ref Range   Glucose-Capillary 142 (H) 70 - 99 mg/dL    Comment: Glucose reference range applies only to samples taken after fasting for at least 8 hours.  Blood gas, arterial     Status: Abnormal   Collection Time: 04/05/23 10:30 PM  Result Value Ref Range   O2 Content 2.0 L/min   Delivery systems NASAL CANNULA    pH, Arterial 7.3 (L) 7.35 - 7.45   pCO2 arterial 47 32 - 48 mmHg   pO2, Arterial 130 (H) 83 - 108 mmHg   Bicarbonate 23.1 20.0 - 28.0 mmol/L   Acid-base deficit 3.3 (H) 0.0 - 2.0 mmol/L   O2 Saturation 98.7 %   Patient temperature 37.0    Collection site RIGHT RADIAL     Allens test (pass/fail) PASS PASS    Comment: Performed at [facility], [address] [place], [place] [address]  Lactic acid, plasma     Status: None   Collection Time: 04/05/23 11:26 PM  Result Value Ref Range   Lactic Acid, Venous 1.5 0.5 - 1.9 mmol/L    Comment: Performed at [facility], [address] [place], [place] [address]  Lactic acid, plasma     Status: None   Collection Time: 04/06/23  5:00 AM  Result Value Ref Range   Lactic Acid, Venous 1.2 0.5 - 1.9 mmol/L    Comment: Performed at [facility], [address], [place], [place] [address]  Protime-INR     Status: Abnormal  Collection Time: 04/06/23  5:00 AM  Result Value Ref Range   Prothrombin  Time 18.6 (H) 11.4 - 15.2 seconds   INR 1.5 (H) 0.8 - 1.2    Comment: (NOTE) INR goal varies based on device and disease states. Performed at [facility], [address] [place], [place] [address]   Cortisol-am, blood     Status: Abnormal   Collection Time: 04/06/23  5:00 AM  Result Value Ref Range   Cortisol - AM 4.5 (L) 6.7 - 22.6 ug/dL    Comment: Performed at [facility], [address], [place], [place] [address]  Comprehensive metabolic panel     Status: Abnormal   Collection Time: 04/06/23  5:00 AM  Result Value Ref Range   Sodium 138 135 - 145 mmol/L   Potassium 4.6 3.5 - 5.1 mmol/L   Chloride 108 98 - 111 mmol/L   CO2 21 (L) 22 - 32 mmol/L   Glucose, Bld 224 (H) 70 - 99 mg/dL    Comment: Glucose reference range applies only to samples taken after fasting for at least 8 hours.   BUN 41 (H) 8 - 23 mg/dL   Creatinine, Ser 8.51 (H) 0.61 - 1.24 mg/dL   Calcium  7.5 (L) 8.9 - 10.3 mg/dL   Total Protein 6.3 (L) 6.5 - 8.1 g/dL   Albumin 2.4 (L) 3.5 - 5.0 g/dL   AST 28 15 - 41 U/L   ALT 34 0 - 44 U/L   Alkaline Phosphatase 59 38 - 126 U/L   Total Bilirubin 0.5 0.0 - 1.2 mg/dL   GFR, Estimated 51 (L) >60 mL/min    Comment: (NOTE) Calculated using the CKD-EPI  Creatinine Equation (2021)    Anion gap 9 5 - 15    Comment: Performed at [facility], [address] [place], [place] [address]  CBC     Status: Abnormal   Collection Time: 04/06/23  5:00 AM  Result Value Ref Range   WBC 5.7 4.0 - 10.5 K/uL   RBC 2.22 (L) 4.22 - 5.81 MIL/uL   Hemoglobin 6.9 (L) 13.0 - 17.0 g/dL   HCT 78.3 (L) 60.9 - 47.9 %   MCV 97.3 80.0 - 100.0 fL   MCH 31.1 26.0 - 34.0 pg   MCHC 31.9 30.0 - 36.0 g/dL   RDW 84.4 88.4 - 84.4 %   Platelets 230 150 - 400 K/uL   nRBC 0.0 0.0 - 0.2 %    Comment: Performed at [facility], [address] [place], [place] [address]  Vitamin B12     Status: None   Collection Time: 04/06/23  5:00 AM  Result Value Ref Range   Vitamin B-12 623 180 - 914 pg/mL    Comment: (NOTE) This assay is not validated for testing neonatal or myeloproliferative syndrome specimens for Vitamin B12 levels. Performed at [facility], [address] [place], [place] [address]   CK     Status: None   Collection Time: 04/06/23  5:00 AM  Result Value Ref Range   Total CK 50 49 - 397 U/L    Comment: Performed at [facility], [address] [place], [place] [address]  Troponin I (High Sensitivity)     Status: None   Collection Time: 04/06/23  5:00 AM  Result Value Ref Range   Troponin I (High Sensitivity) 16 <18 ng/L    Comment: (NOTE) Elevated high sensitivity troponin I (hsTnI) values and significant  changes across  serial measurements may suggest ACS but many other  chronic and acute conditions are known to elevate hsTnI results.  Refer to the Links section for chest pain algorithms and additional  guidance. Performed at [facility], [address] [place], [place] [address]   Type and screen     Status: None (Preliminary result)   Collection Time: 04/06/23  5:02 AM  Result Value Ref Range   ABO/RH(D) A POS    Antibody Screen POS    Sample Expiration 04/09/2023,2359    Antibody  Identification NON SPECIFIC ANTIBODY REACTIVITY    DAT, IgG NEG    DAT, complement NEG    Unit Number [id number]    Blood Component Type RBC, LR IRR    Unit division 00    Status of Unit ALLOCATED    Transfusion Status OK TO TRANSFUSE    Crossmatch Result COMPATIBLE    Unit Number [id number]    Blood Component Type RBC, LR IRR    Unit division 00    Status of Unit ISSUED    Transfusion Status OK TO TRANSFUSE    Crossmatch Result COMPATIBLE    Unit Number [id number]    Blood Component Type RBC, LR IRR    Unit division 00    Status of Unit ALLOCATED    Transfusion Status OK TO TRANSFUSE    Crossmatch Result COMPATIBLE   Prepare RBC (crossmatch)     Status: None   Collection Time: 04/06/23  9:00 AM  Result Value Ref Range   Order Confirmation      ORDER PROCESSED BY BLOOD BANK Performed at [facility], [address] [place], [place] [address]   Ethanol     Status: None   Collection Time: 04/06/23  9:59 AM  Result Value Ref Range   Alcohol, Ethyl (B) <10 <10 mg/dL    Comment: (NOTE) Lowest detectable limit for serum alcohol is 10 mg/dL.  For medical purposes only. Performed at [facility], [address] [place], [place] [address]   CBG monitoring, ED     Status: Abnormal   Collection Time: 04/06/23 12:29 PM  Result Value Ref Range   Glucose-Capillary 451 (H) 70 - 99 mg/dL    Comment: Glucose reference range applies only to samples taken after fasting for at least 8 hours.   *Note: Due to a large number of results and/or encounters for the requested time period, some results have not been displayed. A complete set of results can be found in Results Review.   EEG adult Result Date: 04/06/2023 Shelton Arlin KIDD, MD     04/06/2023 12:53 PM Patient Name: Nicholas Weiss MRN: [id number] Epilepsy Attending: Arlin KIDD Shelton Referring Physician/Provider: Tobie Mario GAILS, MD Date: 04/06/2023 Duration: 24.52 mins Patient history: 73yo M with ams getting  eeg to evaluate for seizure. Level of alertness: Awake, asleep AEDs during EEG study: LEV Technical aspects: This EEG study was done with scalp electrodes positioned according to the 10-20 International system of electrode placement. Electrical activity was reviewed with band pass filter of 1-70Hz , sensitivity of 7 uV/mm, display speed of 31mm/sec with a 60Hz  notched filter applied as appropriate. EEG data were recorded continuously and digitally stored.  Video monitoring was available and reviewed as appropriate. Description: The posterior dominant rhythm consists of 7.5 Hz activity of moderate voltage (25-35 uV) seen predominantly in posterior head regions, symmetric and reactive to eye opening and eye closing. Sleep was characterized by sleep spindles (12 to 14 Hz), maximal  frontocentral region. EEG showed continuous generalized 5 to 7 Hz theta slowing admixed with intermittent generalized 2-3 Hz delta slowing, at times with triphasic morphology. Hyperventilation and photic stimulation were not performed.   ABNORMALITY - Continuous slow, generalized IMPRESSION: This study is suggestive of moderate diffuse encephalopathy. No seizures or epileptiform discharges were seen throughout the recording. Priyanka O Yadav   CT Head Wo Contrast Result Date: 04/05/2023 CLINICAL DATA:  Unresponsive EXAM: CT HEAD WITHOUT CONTRAST TECHNIQUE: Contiguous axial images were obtained from the base of the skull through the vertex without intravenous contrast. RADIATION DOSE REDUCTION: This exam was performed according to the departmental dose-optimization program which includes automated exposure control, adjustment of the mA and/or kV according to patient size and/or use of iterative reconstruction technique. COMPARISON:  03/26/2023 FINDINGS: Brain: No acute intracranial abnormality. Specifically, no hemorrhage, hydrocephalus, mass lesion, acute infarction, or significant intracranial injury. Mild chronic small vessel disease  throughout the deep white matter. Extensive dural calcifications, stable. Vascular: No hyperdense vessel or unexpected calcification. Skull: No acute calvarial abnormality. Sinuses/Orbits: No acute findings Other: None IMPRESSION: No acute intracranial abnormality. Mild chronic small vessel disease. Electronically Signed   By: Franky Crease M.D.   On: 04/05/2023 19:24   DG Chest Portable 1 View Result Date: 04/05/2023 CLINICAL DATA:  Unresponsive EXAM: PORTABLE CHEST 1 VIEW COMPARISON:  03/26/2023 FINDINGS: Heart and mediastinal contours are within normal limits. No focal opacities or effusions. No acute bony abnormality. Previously seen right lateral 8th and 9th rib fractures not well visualized. Aortic atherosclerosis. IMPRESSION: No active disease. Electronically Signed   By: Franky Crease M.D.   On: 04/05/2023 19:23    Pending Labs Unresulted Labs (From admission, onward)     Start     Ordered   04/07/23 0500  CBC  Tomorrow morning,   R        04/06/23 1354   04/07/23 0500  Basic metabolic panel  Tomorrow morning,   R       Question:  Release to patient  Answer:  Immediate   04/06/23 1354   04/06/23 0732  MRSA Next Gen by PCR, Nasal  (MRSA Screening)  Once,   R        04/06/23 0731            Vitals/Pain Today's Vitals   04/06/23 1345 04/06/23 1400 04/06/23 1445 04/06/23 1530  BP:  116/75 118/77 128/73  Pulse: 93 88 80 78  Resp: 16   15  Temp:      TempSrc:      SpO2: 100% 100% 100% 100%  Weight:      Height:      PainSc:        Isolation Precautions No active isolations  Medications Medications  pantoprazole  (PROTONIX ) injection 80 mg (80 mg Intravenous Given 04/06/23 0945)  levETIRAcetam  (KEPPRA ) IVPB 500 mg/100 mL premix (0 mg Intravenous Stopped 04/06/23 1247)  ceFEPIme  (MAXIPIME ) 2 g in sodium chloride  0.9 % 100 mL IVPB (0 g Intravenous Stopped 04/06/23 0945)  vancomycin  (VANCOREADY) IVPB 750 mg/150 mL (0 mg Intravenous Stopped 04/06/23 0946)  atorvastatin  (LIPITOR )  tablet 40 mg (40 mg Oral Given 04/06/23 0945)  sodium chloride  flush (NS) 0.9 % injection 3 mL (3 mLs Intravenous Not Given 04/06/23 0922)  metroNIDAZOLE  (FLAGYL ) IVPB 500 mg (0 mg Intravenous Stopped 04/06/23 0946)  insulin  aspart (novoLOG ) injection 0-6 Units (6 Units Subcutaneous Given 04/06/23 1317)  insulin  aspart (novoLOG ) injection 0-5 Units (has no administration in time range)  insulin   aspart (novoLOG ) injection 2 Units (2 Units Subcutaneous Given 04/06/23 1317)  insulin  glargine-yfgn (SEMGLEE ) injection 3 Units (has no administration in time range)  sodium chloride  0.9 % bolus 1,000 mL (0 mLs Intravenous Stopped 04/05/23 2127)  ceFEPIme  (MAXIPIME ) 2 g in sodium chloride  0.9 % 100 mL IVPB (0 g Intravenous Stopped 04/05/23 2157)  metroNIDAZOLE  (FLAGYL ) IVPB 500 mg (0 mg Intravenous Stopped 04/05/23 2157)  vancomycin  (VANCOCIN ) IVPB 1000 mg/200 mL premix (0 mg Intravenous Stopped 04/05/23 2327)  dextrose  50 % solution 50 mL (50 mLs Intravenous Given 04/05/23 2137)  sodium chloride  0.9 % bolus 1,000 mL (0 mLs Intravenous Stopped 04/05/23 2216)  thiamine  (VITAMIN B1) 500 mg in sodium chloride  0.9 % 50 mL IVPB (0 mg Intravenous Stopped 04/06/23 0727)  potassium chloride  10 mEq in 100 mL IVPB (0 mEq Intravenous Stopped 04/06/23 0034)  hydrocortisone  sodium succinate  (SOLU-CORTEF ) 100 MG injection 100 mg (100 mg Intravenous Given 04/06/23 0828)  lactated ringers  infusion (0 mLs Intravenous Stopped 04/06/23 0945)  0.9 %  sodium chloride  infusion ( Intravenous New Bag/Given 04/06/23 1317)    Mobility Normally walks, but has been in bed using urinal     Focused Assessments Neuro Assessment Handoff:  Swallow screen pass?    Cardiac Rhythm: Normal sinus rhythm       Neuro Assessment: Within Defined Limits Neuro Checks:      Has TPA been given? No If patient is a Neuro Trauma and patient is going to OR before floor call report to 4N Charge nurse: [phone] or [phone]   R Recommendations: See Admitting  Provider Note  Report given to:   Additional Notes:

## 2023-04-06 NOTE — Progress Notes (Signed)
 Progress Note   Patient: Nicholas Weiss FMW:[id number] DOB: 09/05/1950 DOA: 04/05/2023     0 DOS: the patient was seen and examined on 04/06/2023   Brief hospital course: 73 year old man past medical history of alcohol abuse, cocaine abuse, hypertension, prior CVA, type 1 diabetes mellitus, multiple myeloma, chronic kidney disease, paroxysmal atrial fibrillation on Eliquis  presents with altered mental status.  Patient was bradycardic, hypotensive on presentation and also hypothermic and altered mental status.  Patient had a very low sugar.  1/7.  Patient agreeable to blood transfusion and hemoglobin of 6.9.  Patient sugar now above 400 starting on Semglee  insulin  sliding scale and NovoLog  prior to meals.  Patient very labile with his sugars.  Assessment and Plan: * Uncontrolled type 1 diabetes mellitus with hypoglycemia, with long-term current use of insulin  (HCC) Sugar was less than 10 on fingerstick and 24 on chemistry.  Patient sugar came up today greater than 400.  Restart Semglee  insulin  20 units daily +2 units of NovoLog  plus sliding scale.  Hemoglobin A1c elevated at 10.7.   Acute metabolic encephalopathy Likely secondary to low sugar.  Hypothermia Unclear if there is an infection.  Patient placed on empiric antibiotics.  Watch cultures.  Hypotension Will transfuse a unit of blood.  Hold hydralazine .  Seizure disorder (HCC) Placed on IV Keppra .  EEG.  CKD (chronic kidney disease) stage 3, GFR 30-59 ml/min (HCC) CKD stage IIIa.  Creatinine 1.48 with a GFR 51.  Paroxysmal atrial fibrillation (HCC) EKG today sinus rhythm. Eliquis  on hold with iron  deficiency anemia.  Consider restarting tomorrow..  Iron  deficiency anemia Hemoglobin 6.9.  Holding Eliquis .  Give 1 unit of packed red blood cells.        Subjective: On first evaluation patient was a little lethargic and was under the bear hugger.  On second evaluation patient was more alert and stated he is having pain in his  penis.  He had a catheter placed to monitor his core temperature.  Will discontinue Foley catheter.  Physical Exam: Vitals:   04/06/23 0800 04/06/23 1015 04/06/23 1100 04/06/23 1230  BP: 120/78 114/77 116/73 117/84  Pulse: 79 88 81 84  Resp: 14  17   Temp: (!) 97.3 F (36.3 C)     TempSrc:      SpO2: 100% 100% 100% 100%  Weight:      Height:       Physical Exam HENT:     Head: Normocephalic.  Eyes:     General: Lids are normal.     Comments: Pale conjunctiva  Cardiovascular:     Rate and Rhythm: Normal rate and regular rhythm.     Heart sounds: Normal heart sounds, S1 normal and S2 normal.  Pulmonary:     Breath sounds: Examination of the right-lower field reveals decreased breath sounds. Examination of the left-lower field reveals decreased breath sounds. Decreased breath sounds present. No wheezing, rhonchi or rales.  Abdominal:     Palpations: Abdomen is soft.     Tenderness: There is no abdominal tenderness.  Musculoskeletal:     Right lower leg: No swelling.     Left lower leg: No swelling.  Skin:    General: Skin is warm.     Findings: No rash.  Neurological:     Mental Status: He is alert.     Data Reviewed: Ferritin 19 last year.  Hemoglobin 6.9, creatinine 1.48 glucose 24 on chemistry when he came in.  Family Communication: Tried to reach friends earlier.  Disposition:  Status is: Inpatient Remains inpatient appropriate because: Will transfuse 1 unit of packed red blood cells today.  Patient very labile with his sugars now had a sugar of greater than 400.  Starting Semglee  insulin  3 units and sliding scale.  Planned Discharge Destination: To be determined    Time spent: 30 minutes  Author: Charlie Patterson, MD 04/06/2023 1:04 PM  For on call review [email or web address].

## 2023-04-06 NOTE — ED Notes (Signed)
Informed RN bed assigned 

## 2023-04-06 NOTE — Assessment & Plan Note (Signed)
 Likely secondary to low sugar.

## 2023-04-06 NOTE — Assessment & Plan Note (Signed)
 Hemoglobin 6.9.  Holding Eliquis.  Give 1 unit of packed red blood cells.

## 2023-04-06 NOTE — ED Notes (Signed)
 EEG at the bedside  ?

## 2023-04-07 ENCOUNTER — Other Ambulatory Visit: Payer: Self-pay

## 2023-04-07 LAB — BASIC METABOLIC PANEL
Anion gap: 9 (ref 5–15)
BUN: 37 mg/dL — ABNORMAL HIGH (ref 8–23)
CO2: 21 mmol/L — ABNORMAL LOW (ref 22–32)
Calcium: 8.3 mg/dL — ABNORMAL LOW (ref 8.9–10.3)
Chloride: 109 mmol/L (ref 98–111)
Creatinine, Ser: 1.5 mg/dL — ABNORMAL HIGH (ref 0.61–1.24)
GFR, Estimated: 50 mL/min — ABNORMAL LOW (ref >60)
Glucose, Bld: 278 mg/dL — ABNORMAL HIGH (ref 70–99)
Potassium: 4.6 mmol/L (ref 3.5–5.1)
Sodium: 139 mmol/L (ref 135–145)

## 2023-04-07 LAB — GLUCOSE, CAPILLARY
Glucose-Capillary: 257 mg/dL — ABNORMAL HIGH (ref 70–99)
Glucose-Capillary: 124 mg/dL — ABNORMAL HIGH (ref 70–99)
Glucose-Capillary: 104 mg/dL — ABNORMAL HIGH (ref 70–99)
Glucose-Capillary: 102 mg/dL — ABNORMAL HIGH (ref 70–99)
Glucose-Capillary: 183 mg/dL — ABNORMAL HIGH (ref 70–99)

## 2023-04-07 LAB — CBC
HCT: 25.8 % — ABNORMAL LOW (ref 39.0–52.0)
Hemoglobin: 8.7 g/dL — ABNORMAL LOW (ref 13.0–17.0)
MCH: 31 pg (ref 26.0–34.0)
MCHC: 33.7 g/dL (ref 30.0–36.0)
MCV: 91.8 fL (ref 80.0–100.0)
Platelets: 273 10*3/uL (ref 150–400)
RBC: 2.81 MIL/uL — ABNORMAL LOW (ref 4.22–5.81)
RDW: 17.6 % — ABNORMAL HIGH (ref 11.5–15.5)
WBC: 7 10*3/uL (ref 4.0–10.5)
nRBC: 0 % (ref 0.0–0.2)

## 2023-04-07 LAB — TROPONIN I (HIGH SENSITIVITY): Troponin I (High Sensitivity): 13 ng/L (ref <18)

## 2023-04-07 MED ORDER — LEVETIRACETAM 500 MG PO TABS
500.0000 mg | ORAL_TABLET | Freq: Two times a day (BID) | ORAL | Status: DC
  Administered 2023-04-07 – 2023-04-11 (×8): 500 mg via ORAL
  Filled 2023-04-07 (×8): qty 1

## 2023-04-07 NOTE — Evaluation (Signed)
 Occupational Therapy Evaluation Patient Details Name: Nicholas Weiss MRN: [id number] DOB: July 02, 1950 Today's Date: 04/07/2023   History of Present Illness Patient is a 73 year old male presenting with altered mental status, was bradycardic, hypotensive, hypothermic, hypoglycemic. History of CVA, HTN, multiple myeloma, CKD, paroxysmal A-fib, alcohol abuse, cocaine abuse.   Clinical Impression   Nicholas Weiss was seen for OT evaluation this date. Prior to hospital admission, pt was at [facility] walking with RW. Pt lives with spouse. Pt currently requires CGA + RW for ADL t/f ~160 ft, 1 minor LOB pt corrects with step. MOD I don pants at bed level. Pt would benefit from skilled OT to address noted impairments and functional limitations (see below for any additional details). Upon hospital discharge, recommend OT follow up and assistance at home.     If plan is discharge home, recommend the following: Supervision due to cognitive status;Help with stairs or ramp for entrance    Functional Status Assessment  Patient has had a recent decline in their functional status and demonstrates the ability to make significant improvements in function in a reasonable and predictable amount of time.  Equipment Recommendations  BSC/3in1    Recommendations for Other Services       Precautions / Restrictions Precautions Precautions: Fall Restrictions Weight Bearing Restrictions Per Provider Order: No      Mobility Bed Mobility Overal bed mobility: Modified Independent                  Transfers Overall transfer level: Modified independent Equipment used: Rolling walker (2 wheels)                      Balance Overall balance assessment: Needs assistance Sitting-balance support: Feet supported, Bilateral upper extremity supported Sitting balance-Leahy Scale: Good     Standing balance support: Bilateral upper extremity supported Standing balance-Leahy Scale: Fair                              ADL either performed or assessed with clinical judgement   ADL Overall ADL's : Needs assistance/impaired                                       General ADL Comments: CGA + RW for ADL t/f ~160 ft. MOD I don pants at bed level      Pertinent Vitals/Pain Pain Assessment Pain Assessment: No/denies pain     Extremity/Trunk Assessment Upper Extremity Assessment Upper Extremity Assessment: Overall WFL for tasks assessed   Lower Extremity Assessment Lower Extremity Assessment: Generalized weakness       Communication Communication Communication: No apparent difficulties   Cognition Arousal: Alert Behavior During Therapy: WFL for tasks assessed/performed Overall Cognitive Status: No family/caregiver present to determine baseline cognitive functioning                                 General Comments: MIN cues for impulsivity                Home Living Family/patient expects to be discharged to:: Private residence Living Arrangements: Spouse/significant other;Children Available Help at Discharge: Family Type of Home: House Home Access: Stairs to enter Secretary/administrator of Steps: 1 Entrance Stairs-Rails: Can reach both Home Layout: One level     Bathroom Shower/Tub:  Walk-in shower         Home Equipment: Agricultural Consultant (2 wheels);Cane - quad;Shower seat;Grab bars - toilet;Grab bars - tub/shower   Additional Comments: pt reports living with significant other and his son      Prior Functioning/Environment Prior Level of Function : Independent/Modified Independent             Mobility Comments: patient reports ambulation with rolling walker ADLs Comments: presume assist for ADLs at SNF        OT Problem List: Decreased coordination;Decreased activity tolerance;Decreased safety awareness;Impaired balance (sitting and/or standing);Decreased knowledge of precautions;Decreased knowledge of use of DME or  AE;Decreased strength;Decreased cognition      OT Treatment/Interventions: Self-care/ADL training;Therapeutic exercise;Patient/family education;Balance training;Therapeutic activities;Cognitive remediation/compensation    OT Goals(Current goals can be found in the care plan section) Acute Rehab OT Goals Patient Stated Goal: improve strength and go home OT Goal Formulation: With patient Time For Goal Achievement: 04/21/23 Potential to Achieve Goals: Good ADL Goals Pt Will Perform Grooming: Independently;standing Pt Will Perform Lower Body Dressing: Independently;sit to/from stand Pt Will Transfer to Toilet: Independently;ambulating;regular height toilet  OT Frequency: Min 1X/week    Co-evaluation PT/OT/SLP Co-Evaluation/Treatment: Yes Reason for Co-Treatment: To address functional/ADL transfers PT goals addressed during session: Mobility/safety with mobility OT goals addressed during session: ADL's and self-care      AM-PAC OT 6 Clicks Daily Activity     Outcome Measure Help from another person eating meals?: None Help from another person taking care of personal grooming?: A Little Help from another person toileting, which includes using toliet, bedpan, or urinal?: A Little Help from another person bathing (including washing, rinsing, drying)?: A Little Help from another person to put on and taking off regular upper body clothing?: None Help from another person to put on and taking off regular lower body clothing?: None 6 Click Score: 21   End of Session Equipment Utilized During Treatment: Rolling walker (2 wheels)  Activity Tolerance: Patient tolerated treatment well Patient left: in bed;with call bell/phone within reach;with bed alarm set  OT Visit Diagnosis: Other abnormalities of gait and mobility (R26.89);Repeated falls (R29.6);Unsteadiness on feet (R26.81)                Time: 9072-9058 OT Time Calculation (min): 14 min Charges:  OT General Charges $OT Visit: 1  Visit OT Evaluation $OT Eval Low Complexity: 1 Low  Nicholas Weiss, M.S. OTR/L  04/07/23, 1:13 PM  ascom [phone]

## 2023-04-07 NOTE — Plan of Care (Signed)
  Problem: Fluid Volume: Goal: Hemodynamic stability will improve Outcome: Progressing   Problem: Clinical Measurements: Goal: Diagnostic test results will improve Outcome: Progressing Goal: Signs and symptoms of infection will decrease Outcome: Progressing   Problem: Respiratory: Goal: Ability to maintain adequate ventilation will improve Outcome: Progressing   Problem: Education: Goal: Ability to describe self-care measures that may prevent or decrease complications (Diabetes Survival Skills Education) will improve Outcome: Progressing Goal: Individualized Educational Video(s) Outcome: Progressing   Problem: Coping: Goal: Ability to adjust to condition or change in health will improve Outcome: Progressing   Problem: Fluid Volume: Goal: Ability to maintain a balanced intake and output will improve Outcome: Progressing   Problem: Health Behavior/Discharge Planning: Goal: Ability to identify and utilize available resources and services will improve Outcome: Progressing Goal: Ability to manage health-related needs will improve Outcome: Progressing   Problem: Metabolic: Goal: Ability to maintain appropriate glucose levels will improve Outcome: Progressing   Problem: Nutritional: Goal: Maintenance of adequate nutrition will improve Outcome: Progressing Goal: Progress toward achieving an optimal weight will improve Outcome: Progressing   Problem: Skin Integrity: Goal: Risk for impaired skin integrity will decrease Outcome: Progressing   Problem: Tissue Perfusion: Goal: Adequacy of tissue perfusion will improve Outcome: Progressing   Problem: Education: Goal: Knowledge of General Education information will improve Description: Including pain rating scale, medication(s)/side effects and non-pharmacologic comfort measures Outcome: Progressing   Problem: Health Behavior/Discharge Planning: Goal: Ability to manage health-related needs will improve Outcome:  Progressing   Problem: Clinical Measurements: Goal: Ability to maintain clinical measurements within normal limits will improve Outcome: Progressing Goal: Will remain free from infection Outcome: Progressing Goal: Diagnostic test results will improve Outcome: Progressing Goal: Respiratory complications will improve Outcome: Progressing Goal: Cardiovascular complication will be avoided Outcome: Progressing   Problem: Activity: Goal: Risk for activity intolerance will decrease Outcome: Progressing   Problem: Nutrition: Goal: Adequate nutrition will be maintained Outcome: Progressing   Problem: Coping: Goal: Level of anxiety will decrease Outcome: Progressing   Problem: Elimination: Goal: Will not experience complications related to bowel motility Outcome: Progressing Goal: Will not experience complications related to urinary retention Outcome: Progressing   Problem: Pain Management: Goal: General experience of comfort will improve Outcome: Progressing   Problem: Safety: Goal: Ability to remain free from injury will improve Outcome: Progressing   Problem: Skin Integrity: Goal: Risk for impaired skin integrity will decrease Outcome: Progressing

## 2023-04-07 NOTE — Evaluation (Signed)
 Physical Therapy Evaluation Patient Details Name: NICOLAAS SAVO MRN: [id number] DOB: [birth year] Today's Date: 04/07/2023  History of Present Illness  Patient is a 73 year old male presenting with altered mental status, was bradycardic, hypotensive, hypothermic, hypoglycemic. History of CVA, HTN, multiple myeloma, CKD, paroxysmal A-fib, alcohol abuse, cocaine abuse.   Clinical Impression  Patient is agreeable to PT evaluation. He reports he was at rehab but is eager to go home from the hospital. The patient reports he was ambulating with rolling walker without assistance prior to coming in.  Today the patient is Modified independent with bed mobility and transfers. He walked a lap in the hallway with the rolling walker with CGA for safety. One mild loss of balance when distracted and looking to the right that is self corrected. Recommend to continue PT to maximize independence and decrease caregiver burden. Patient is hopeful to return home with family support.       If plan is discharge home, recommend the following: A little help with bathing/dressing/bathroom;Assistance with cooking/housework;Assist for transportation;Help with stairs or ramp for entrance   Can travel by private vehicle        Equipment Recommendations None recommended by PT  Recommendations for Other Services       Functional Status Assessment Patient has had a recent decline in their functional status and demonstrates the ability to make significant improvements in function in a reasonable and predictable amount of time.     Precautions / Restrictions Precautions Precautions: Fall Restrictions Weight Bearing Restrictions Per Provider Order: No      Mobility  Bed Mobility Overal bed mobility: Modified Independent                  Transfers Overall transfer level: Modified independent                      Ambulation/Gait Ambulation/Gait assistance: Contact guard assist Gait Distance  (Feet): 175 Feet Assistive device: Rolling walker (2 wheels) Gait Pattern/deviations: Step-through pattern, Staggering right Gait velocity: decreased     General Gait Details: one minor loss of balance that was self corrected. cues to stand closer to rolling walker for support. no dizziness reported with mobility, blood pressure within normal range after walking.  Stairs            Wheelchair Mobility     Tilt Bed    Modified Rankin (Stroke Patients Only)       Balance Overall balance assessment: Needs assistance Sitting-balance support: Feet supported Sitting balance-Leahy Scale: Good     Standing balance support: Bilateral upper extremity supported Standing balance-Leahy Scale: Fair Standing balance comment: using rolling walker for support with dynamic activity with brief periods during static standing without UE support                             Pertinent Vitals/Pain Pain Assessment Pain Assessment: No/denies pain    Home Living Family/patient expects to be discharged to:: Private residence Living Arrangements: Spouse/significant other;Children Available Help at Discharge: Family Type of Home: House Home Access: Stairs to enter Entrance Stairs-Rails: Can reach both Entrance Stairs-Number of Steps: 1   Home Layout: One level Home Equipment: Agricultural Consultant (2 wheels);Cane - quad;Shower seat;Grab bars - toilet;Grab bars - tub/shower Additional Comments: pt reports living with significant other and his son    Prior Function Prior Level of Function : Independent/Modified Independent  Mobility Comments: patient reports ambulation with rolling walker ADLs Comments: presume assist for ADLs at [facility]     Extremity/Trunk Assessment   Upper Extremity Assessment Upper Extremity Assessment: Overall WFL for tasks assessed    Lower Extremity Assessment Lower Extremity Assessment: Generalized weakness       Communication    Communication Communication: No apparent difficulties  Cognition Arousal: Alert Behavior During Therapy: WFL for tasks assessed/performed Overall Cognitive Status: No family/caregiver present to determine baseline cognitive functioning                                 General Comments: Patient is cooperative and able to follow single step commands with increased time. Mildly impulsive with activity with safety cues required        General Comments      Exercises     Assessment/Plan    PT Assessment Patient needs continued PT services  PT Problem List Decreased strength;Decreased activity tolerance;Decreased balance;Decreased mobility;Decreased cognition;Decreased safety awareness       PT Treatment Interventions DME instruction;Gait training;Stair training;Functional mobility training;Therapeutic activities;Therapeutic exercise;Balance training;Neuromuscular re-education;Cognitive remediation;Patient/family education    PT Goals (Current goals can be found in the Care Plan section)  Acute Rehab PT Goals Patient Stated Goal: to go home from the hospital PT Goal Formulation: With patient Time For Goal Achievement: 04/21/23 Potential to Achieve Goals: Fair    Frequency Min 1X/week     Co-evaluation PT/OT/SLP Co-Evaluation/Treatment: Yes Reason for Co-Treatment: To address functional/ADL transfers PT goals addressed during session: Mobility/safety with mobility OT goals addressed during session: ADL's and self-care       AM-PAC PT 6 Clicks Mobility  Outcome Measure Help needed turning from your back to your side while in a flat bed without using bedrails?: None Help needed moving from lying on your back to sitting on the side of a flat bed without using bedrails?: None Help needed moving to and from a bed to a chair (including a wheelchair)?: A Little Help needed standing up from a chair using your arms (e.g., wheelchair or bedside chair)?: A Little Help  needed to walk in hospital room?: A Little Help needed climbing 3-5 steps with a railing? : A Little 6 Click Score: 20    End of Session   Activity Tolerance: Patient tolerated treatment well Patient left: in bed;with call bell/phone within reach;with bed alarm set   PT Visit Diagnosis: Unsteadiness on feet (R26.81);Muscle weakness (generalized) (M62.81)    Time: 9076-9056 PT Time Calculation (min) (ACUTE ONLY): 20 min   Charges:   PT Evaluation $PT Eval Low Complexity: 1 Low   PT General Charges $$ ACUTE PT VISIT: 1 Visit         Randine Essex, PT, MPT   Randine LULLA Essex 04/07/2023, 12:49 PM

## 2023-04-07 NOTE — Progress Notes (Addendum)
 Pharmacy Antibiotic Note  Nicholas Weiss is a 73 y.o. male admitted on 04/05/2023 with sepsis. PMH is significant for alcohol abuse, cocaine abuse, hypertension, prior CVA, type 1 diabetes mellitus, multiple myeloma, chronic kidney disease, and paroxysmal atrial fibrillation on Eliquis . Patient presented with altered mental status and found to be hypoglycemic. Pharmacy has been consulted for cefepime  & vancomycin  dosing.  Today, 04/07/2023 Day 3 of antibiotics  WBC WNL Afebrile  Scr 1.50 today with estimated CrCl of 40.1 mL/min  Blood cultures negative thus far  No acute findings on CXR UA unremarkable   Plan: Continue cefepime  2 gm Q12H based on current renal function  Continue vancomycin  750 mg IV Q24H  Goal AUC 400 - 550  Estimated AUC 446.4 Estimated Cmin 11.9 Scr used 1.50, TBW, Vd 0.5 Patient also receiving metronidazole  500 mg IV Q12H  Pharmacy will continue to monitor and dose adjust appropriately   Temp (24hrs), Avg:98 F (36.7 C), Min:97.3 F (36.3 C), Max:98.7 F (37.1 C)  Recent Labs  Lab 04/05/23 1854 04/05/23 2326 04/06/23 0500 04/07/23 0509  WBC 7.4  --  5.7 7.0  CREATININE 1.63*  --  1.48* 1.50*  LATICACIDVEN 3.4* 1.5 1.2  --     Estimated Creatinine Clearance: 40.1 mL/min (A) (by C-G formula based on SCr of 1.5 mg/dL (H)).    Allergies  Allergen Reactions   Penicillins Other (See Comments)    Childhood allergy -tolerated amoxil  03/2022 Unknown reaction Has patient had a PCN reaction causing immediate rash, facial/tongue/throat swelling, SOB or lightheadedness with hypotension: No Has patient had a PCN reaction causing severe rash involving mucus membranes or skin necrosis: No Has patient had a PCN reaction that required hospitalization No Has patient had a PCN reaction occurring within the last 10 years: No If all of the above answers are NO, then may proceed with Cephalosporin use.    Antimicrobials this admission: 1/06 Cefepime  >>  1/06 Flagyl  >>   1/06 Vancomycin  >>   Microbiology results: 1/06 BCx: NG < 12 hours   Thank you for allowing pharmacy to be a part of this patient's care.  Evonnie Nieves, PharmD Pharmacy Resident  04/07/2023 7:20 AM

## 2023-04-07 NOTE — Progress Notes (Signed)
 Progress Note   Patient: Nicholas Weiss FMW:[id number] DOB: 05-Sep-1950 DOA: 04/05/2023     1 DOS: the patient was seen and examined on 04/07/2023   Brief hospital course: 73 year old man past medical history of alcohol abuse, cocaine abuse, hypertension, prior CVA, type 1 diabetes mellitus, multiple myeloma, chronic kidney disease, paroxysmal atrial fibrillation on Eliquis  presents with altered mental status.  Patient was bradycardic, hypotensive on presentation and also hypothermic and altered mental status.  Patient had a very low sugar.  1/7.  Patient agreeable to blood transfusion and hemoglobin of 6.9.  Patient sugar now above 400 starting on Semglee  insulin  sliding scale and NovoLog  prior to meals.  Patient very labile with his sugars.  Assessment and Plan: * Uncontrolled type 1 diabetes mellitus with hypoglycemia, with long-term current use of insulin  (HCC) Sugar was less than 10 on fingerstick and 24 on chemistry. Hemoglobin A1c elevated at 10.7. Unclear exact home regimen. He is currently on 3 u of semeglee and 2u of meal time + SSI.   CBG (last 3)  Recent Labs    04/07/23 1141 04/07/23 1235 04/07/23 1606  GLUCAP 124* 104* 102*   Are well controlled currently.  -Continue to monitor his blood glucose on current regimen -Consult diabetes educator  -Will need outpatient endocrine follow up    Seizure disorder (HCC) Transition keppra  to PO  CKD (chronic kidney disease) stage 3, GFR 30-59 ml/min (HCC) CKD stage IIIa.  Creatinine 1.48 with a GFR 51.    Latest Ref Rng & Units 04/07/2023    5:09 AM 04/06/2023    5:00 AM 04/05/2023    6:54 PM  BMP  Glucose 70 - 99 mg/dL 721  775  24   BUN 8 - 23 mg/dL 37  41  40   Creatinine 0.61 - 1.24 mg/dL 8.49  8.51  8.36   Sodium 135 - 145 mmol/L 139  138  140   Potassium 3.5 - 5.1 mmol/L 4.6  4.6  2.9   Chloride 98 - 111 mmol/L 109  108  107   CO2 22 - 32 mmol/L 21  21  23    Calcium  8.9 - 10.3 mg/dL 8.3  7.5  7.9    Stable.   Acute  metabolic encephalopathy Resolved  Likely secondary to low sugar.  Hypothermia Resolved Unclear if an infection.  Patient placed on empiric antibiotics.  Blood cultures negative. Discontinued antibiotics 1/8 AM.  Hypotension Resolved.  Normotensive. Continue to Hold hydralazine .  Paroxysmal atrial fibrillation (HCC) EKG today sinus rhythm. Eliquis  on hold with anemia.  -Will resume eliquis  in the AM.    Iron  deficiency anemia Likely component of anemia of CKD  Hemoglobin 6.9>8.7. appropriately responded to 1u of prbcs. Unclear etiology of acute loss. No obvious signs of bleeding.   Holding Eliquis .  Give 1 unit of packed red blood cells. On iron  supplementation at home.      Latest Ref Rng & Units 04/07/2023    5:09 AM 04/06/2023    5:00 AM 04/05/2023    6:54 PM  CBC  WBC 4.0 - 10.5 K/uL 7.0  5.7  7.4   Hemoglobin 13.0 - 17.0 g/dL 8.7  6.9  7.8   Hematocrit 39.0 - 52.0 % 25.8  21.6  24.8   Platelets 150 - 400 K/uL 273  230  337          Subjective: Alert with no complaints. He states he knows his blood sugars are not well controlled.   Physical Exam:  Vitals:   04/07/23 1400 04/07/23 1500 04/07/23 1600 04/07/23 1607  BP:    137/89  Pulse:    72  Resp: 18 12 10 14   Temp:    97.8 F (36.6 C)  TempSrc:    Oral  SpO2:    100%  Weight:      Height:       Physical Exam  Constitutional: In no distress.  Cardiovascular: Normal rate, regular rhythm. No lower extremity edema  Pulmonary: Non labored breathing on room air, no wheezing or rales.   Abdominal: Soft. Normal bowel sounds. Non distended and non tender Musculoskeletal: Normal range of motion.     Neurological: Alert and oriented to person, place, and time. Non focal  Skin: Skin is warm and dry.    Data Reviewed: Ferritin 19 last year.  Hemoglobin 6.9, creatinine 1.48 glucose 24 on chemistry when he came in. I have reviewed all labs and images.   Family Communication: None  Disposition: Status is:  Inpatient Remains inpatient appropriate because: Further titration of insulin  given labile blood glucose   Planned Discharge Destination: To be determined    Time spent: 35 minutes  Author: Alban Pepper, MD 04/07/2023 5:52 PM  For on call review [email or web address].

## 2023-04-08 LAB — CBC
HCT: 26.6 % — ABNORMAL LOW (ref 39.0–52.0)
Hemoglobin: 8.8 g/dL — ABNORMAL LOW (ref 13.0–17.0)
MCH: 30.3 pg (ref 26.0–34.0)
MCHC: 33.1 g/dL (ref 30.0–36.0)
MCV: 91.7 fL (ref 80.0–100.0)
Platelets: 267 10*3/uL (ref 150–400)
RBC: 2.9 MIL/uL — ABNORMAL LOW (ref 4.22–5.81)
RDW: 16.7 % — ABNORMAL HIGH (ref 11.5–15.5)
WBC: 6.4 10*3/uL (ref 4.0–10.5)
nRBC: 0 % (ref 0.0–0.2)

## 2023-04-08 LAB — GLUCOSE, CAPILLARY
Glucose-Capillary: 56 mg/dL — ABNORMAL LOW (ref 70–99)
Glucose-Capillary: 132 mg/dL — ABNORMAL HIGH (ref 70–99)
Glucose-Capillary: 224 mg/dL — ABNORMAL HIGH (ref 70–99)
Glucose-Capillary: 97 mg/dL (ref 70–99)
Glucose-Capillary: 262 mg/dL — ABNORMAL HIGH (ref 70–99)

## 2023-04-08 LAB — BASIC METABOLIC PANEL
Anion gap: 9 (ref 5–15)
BUN: 26 mg/dL — ABNORMAL HIGH (ref 8–23)
CO2: 22 mmol/L (ref 22–32)
Calcium: 8.2 mg/dL — ABNORMAL LOW (ref 8.9–10.3)
Chloride: 102 mmol/L (ref 98–111)
Creatinine, Ser: 1.25 mg/dL — ABNORMAL HIGH (ref 0.61–1.24)
GFR, Estimated: >60 mL/min (ref >60)
Glucose, Bld: 99 mg/dL (ref 70–99)
Potassium: 4 mmol/L (ref 3.5–5.1)
Sodium: 133 mmol/L — ABNORMAL LOW (ref 135–145)

## 2023-04-08 LAB — MRSA NEXT GEN BY PCR, NASAL: MRSA by PCR Next Gen: NOT DETECTED

## 2023-04-08 MED ORDER — HALOPERIDOL LACTATE 5 MG/ML IJ SOLN
INTRAMUSCULAR | Status: AC
  Administered 2023-04-08: 5 mg via INTRAVENOUS
  Filled 2023-04-08: qty 1

## 2023-04-08 MED ORDER — APIXABAN 5 MG PO TABS
5.0000 mg | ORAL_TABLET | Freq: Two times a day (BID) | ORAL | Status: DC
  Administered 2023-04-08 – 2023-04-11 (×6): 5 mg via ORAL
  Filled 2023-04-08 (×6): qty 1

## 2023-04-08 MED ORDER — INSULIN GLARGINE-YFGN 100 UNIT/ML ~~LOC~~ SOLN
2.0000 [IU] | Freq: Every day | SUBCUTANEOUS | Status: DC
  Administered 2023-04-08 – 2023-04-10 (×3): 2 [IU] via SUBCUTANEOUS
  Filled 2023-04-08 (×4): qty 0.02

## 2023-04-08 MED ORDER — HALOPERIDOL LACTATE 5 MG/ML IJ SOLN: 5.0000 mg | Freq: Once | INTRAMUSCULAR | Status: AC

## 2023-04-08 NOTE — Plan of Care (Signed)
  Problem: Respiratory: Goal: Ability to maintain adequate ventilation will improve Outcome: Progressing   Problem: Coping: Goal: Ability to adjust to condition or change in health will improve Outcome: Not Progressing   Problem: Fluid Volume: Goal: Ability to maintain a balanced intake and output will improve Outcome: Progressing

## 2023-04-08 NOTE — Progress Notes (Signed)
 Patient awakened very confused and irate. Patient is unaware that he is in the hospital Patient keeps referring to me as [place]. Tried to reorient patient. Patient is becoming hostile and yelling at staff members. Patient is not cooperative. Notified NP Jawo about patient's current condition. Concerns for patient and staff safety. Requested medication to help calm patient. New orders placed. Will continue to monitor and assess. Attempting to sit in room with patient but it appears to aggravate him more.

## 2023-04-08 NOTE — Progress Notes (Signed)
 Hypoglycemic Event  CBG: 56  Treatment: 8oz juice  Symptoms: None  Follow-up CBG: CBG Result:132  Possible Reasons for Event: Unknown  Comments/MD notified: Marolyn Haller, MD notified @ [phone]  Ansel Bong

## 2023-04-08 NOTE — TOC Initial Note (Signed)
 Transition of Care [facility]) - Initial/Assessment Note    Patient Details  Name: Nicholas Weiss MRN: [id number] Date of Birth: 27-Oct-1950  [facility] CM/SW Contact:    Lashaya Kienitz C Aayansh Codispoti, RN Phone Number: 04/08/2023, 4:09 PM  Clinical Narrative:                 Attempt to reach patient's friend several times to discuss HH. No answer.        Patient Goals and CMS Choice            Expected Discharge Plan and Services                                              Prior Living Arrangements/Services                       Activities of Daily Living      Permission Sought/Granted                  Emotional Assessment              Admission diagnosis:  Hypoglycemia [E16.2] Altered mental status, unspecified altered mental status type [R41.82] AMS (altered mental status) [R41.82] Patient Active Problem List   Diagnosis Date Noted   Uncontrolled type 1 diabetes mellitus with hypoglycemia, with long-term current use of insulin  (HCC) 04/05/2023   Hyperosmolar hyperglycemic state (HHS) (HCC) 03/26/2023   Iron  deficiency anemia 03/26/2023   Seizure disorder (HCC) 03/26/2023   Systemic inflammatory response syndrome (SIRS) associated with organ dysfunction (HCC) 03/26/2023   NSTEMI (non-ST elevated myocardial infarction) (HCC) 03/09/2023   Elevated troponin 03/08/2023   Paroxysmal atrial fibrillation (HCC) 03/08/2023   Chronic anticoagulation 03/08/2023   ICH (intracerebral hemorrhage) (HCC) 02/16/2023   Uncontrolled diabetes mellitus with hyperglycemia (HCC) 02/16/2023   GERD without esophagitis 02/16/2023   Hypertension 02/16/2023   Diabetic neuropathy (HCC) 02/16/2023   Intraventricular hemorrhage (HCC) 02/16/2023   Myoclonus 01/15/2023   Hypothermia 12/29/2022   Dizziness 11/13/2022   Substance abuse (HCC) 09/18/2022   History of GI bleed 09/18/2022   History of intraventricular hemorrhage following syncopal event 02/15/2023  09/18/2022   Uncontrolled type 1 diabetes mellitus with hyperglycemia, with long-term current use of insulin  (HCC) 08/26/2022   Postural dizziness with presyncope 08/18/2022   Near syncope 08/18/2022   Closed fracture of shaft of left humerus 06/18/2022   Malnutrition of moderate degree 06/10/2022   Hypotension 06/10/2022   GI bleed 06/06/2022   Subdural hematoma (HCC) 05/15/2022   Falls 05/15/2022   Shoulder pain, left 05/15/2022   History of amputation of left great toe (HCC) 05/04/2022   Hypophosphatemia 04/28/2022   Protein-calorie malnutrition, severe 04/24/2022   Diabetic ulcer of toe associated with diabetes mellitus due to underlying condition, with fat layer exposed (HCC) 04/24/2022   Streptococcal bacteremia 04/24/2022   Leukopenia 04/24/2022   History of multiple myeloma 04/24/2022   Dysautonomia orthostatic hypotension with recurrent syncope 04/24/2022   Weight loss 04/23/2022   Osteomyelitis of left foot (HCC) 04/22/2022   DKA, type 1 (HCC) 04/22/2022   Dehydration 04/08/2022   Diabetic ketoacidosis without coma associated with type 1 diabetes mellitus (HCC) 04/07/2022   Hyperkalemia 04/07/2022   Traumatic pneumothorax 02/18/2022   Multiple fractures of ribs, left side, initial encounter for closed fracture 02/18/2022   Pneumothorax, closed, traumatic 02/18/2022   Benign  prostatic hyperplasia with nocturia 02/12/2022   Stroke (cerebrum) (HCC) 11/07/2021   Multiple myeloma (HCC) 09/29/2021   Multiple myeloma not having achieved remission (HCC) 09/29/2021   Hyponatremia 08/18/2021   COVID-19 virus infection 08/13/2021   History of CVA (cerebrovascular accident) 08/13/2021   Syncope and collapse, recurrent 08/09/2021   Hepatic lesion 08/05/2021   Dyslipidemia 05/08/2021   History of substance abuse (HCC) 12/09/2020   Acute metabolic encephalopathy 12/02/2020   Cocaine abuse (HCC) 11/30/2020   Muscle twitching 09/12/2020   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    Pre-syncope 08/01/2020   Heart block AV first degree 08/01/2020   Anemia of chronic kidney failure, stage 3 (moderate) (HCC) 05/28/2020   PUD (peptic ulcer disease)    Esophageal dysphagia    Encounter for screening colonoscopy    Hyperglycemia 07/28/2019   Generalized weakness 07/28/2019   Hypoglycemia 04/27/2019   Unresponsiveness 04/27/2019   Lung nodule 04/07/2019   Acute renal failure superimposed on stage 3a chronic kidney disease (HCC)    Hypertensive urgency 04/05/2019   AKI (acute kidney injury) (HCC) 04/05/2019   CKD (chronic kidney disease) stage 3, GFR 30-59 ml/min (HCC) 04/05/2019   Hyperlipidemia 01/24/2016   Type 1 diabetes mellitus with diabetic neuropathy, unspecified (HCC) 05/31/2006   PCP:  Alla Amis, MD Pharmacy:   [facility] [address] ([address]), [place] - [address] [place] ([place]) [place] [address] Phone: [phone] Fax: [phone]  [facility] - [place], [place] - [address] [place] [address] Phone: [phone] Fax: [phone]  [facility] - [place], [place] - [address] [place] [address] Phone: [phone] Fax: [phone]  [facility] [address] [place] [address] Phone: [phone] Fax: [phone]     Social Drivers of Health (SDOH) Social History: SDOH Screenings   Food Insecurity: Patient Declined (04/06/2023)  Housing: Patient Unable To Answer (03/27/2023)  Transportation Needs: Patient Unable To Answer (03/27/2023)  Utilities: Patient Unable To Answer (03/27/2023)  Alcohol Screen: Low Risk  (10/02/2020)  Depression (PHQ2-9): Medium Risk (10/02/2020)  Financial Resource Strain: Low Risk  (01/26/2023)   Received from [facility]  Physical Activity: Insufficiently Active (01/01/2020)  Social Connections: Moderately Isolated (03/30/2023)  Stress: No  Stress Concern Present (01/01/2020)  Tobacco Use: Low Risk  (04/05/2023)  Recent Concern: Tobacco Use - Medium Risk (02/15/2023)   Received from [facility]   SDOH Interventions:     Readmission Risk Interventions    02/16/2023    3:56 PM 08/24/2022   10:57 AM 05/20/2022   11:41 AM  Readmission Risk Prevention Plan  Transportation Screening Complete Complete Complete  Medication Review Oceanographer) Complete Complete Complete  PCP or Specialist appointment within 3-5 days of discharge Complete Complete Complete  HRI or Home Care Consult Complete Complete Complete  SW Recovery Care/Counseling Consult Not Complete    Palliative Care Screening Not Applicable  Complete  Skilled Nursing Facility Complete Complete Complete

## 2023-04-08 NOTE — Progress Notes (Signed)
 Progress Note   Patient: Nicholas Weiss [id number] DOB: 03/20/1951 DOA: 04/05/2023     2 DOS: the patient was seen and examined on 04/08/2023   Brief hospital course: 73 year old man past medical history of alcohol abuse, cocaine abuse, hypertension, prior CVA, type 1 diabetes mellitus, multiple myeloma, chronic kidney disease, paroxysmal atrial fibrillation on Eliquis  presents with altered mental status.  Patient was bradycardic, hypotensive on presentation and also hypothermic and altered mental status.  Patient had a very low sugar.  1/7.  Patient agreeable to blood transfusion and hemoglobin of 6.9.  Patient sugar now above 400 starting on Semglee  insulin  sliding scale and NovoLog  prior to meals.  Patient very labile with his sugars.  Assessment and Plan: * Uncontrolled type 1 diabetes mellitus with hypoglycemia, with long-term current use of insulin  (HCC) Remains labile.  Had hypoglycemic episode.  Will contninue reduced dose of LA at 2u +2u TID With meals and SSI  Diabetes educator saw patient.  -Will need outpatient endocrine follow up  -Discharge with dexcom G7 script    Seizure disorder (HCC) Continue PO keppra    CKD (chronic kidney disease) stage 3, GFR 30-59 ml/min (HCC) CKD stage IIIa.  Creatinine 1.48 with a GFR 51.    Latest Ref Rng & Units 04/08/2023    9:02 AM 04/07/2023    5:09 AM 04/06/2023    5:00 AM  BMP  Glucose 70 - 99 mg/dL 99  721  775   BUN 8 - 23 mg/dL 26  37  41   Creatinine 0.61 - 1.24 mg/dL 8.74  8.49  8.51   Sodium 135 - 145 mmol/L 133  139  138   Potassium 3.5 - 5.1 mmol/L 4.0  4.6  4.6   Chloride 98 - 111 mmol/L 102  109  108   CO2 22 - 32 mmol/L 22  21  21    Calcium  8.9 - 10.3 mg/dL 8.2  8.3  7.5    Stable.   Hyponatremia  Mild CTM   Acute metabolic encephalopathy Resolved  Likely secondary to low sugar.  Hypothermia Resolved Unclear if an infection.  Patient placed on empiric antibiotics.  Blood cultures negative. Discontinued  antibiotics 1/8 AM.  Hypotension Resolved.  Normotensive. Continue to Hold hydralazine .  Paroxysmal atrial fibrillation (HCC) Eliquis  resumed this PM    Iron  deficiency anemia Likely component of anemia of CKD  Stable.       Latest Ref Rng & Units 04/08/2023    9:02 AM 04/07/2023    5:09 AM 04/06/2023    5:00 AM  CBC  WBC 4.0 - 10.5 K/uL 6.4  7.0  5.7   Hemoglobin 13.0 - 17.0 g/dL 8.8  8.7  6.9   Hematocrit 39.0 - 52.0 % 26.6  25.8  21.6   Platelets 150 - 400 K/uL 267  273  230          Subjective: Alert with no complaints. He states he knows his blood sugars are not well controlled.   Physical Exam: Vitals:   04/07/23 2004 04/07/23 2331 04/08/23 1200 04/08/23 1729  BP: (!) 166/97 (!) 171/105 (!) 160/108 (!) 150/92  Pulse: 71 72 81 76  Resp: 18 18  17   Temp: 98.3 F (36.8 C) 98.3 F (36.8 C) (!) 97.5 F (36.4 C) 97.7 F (36.5 C)  TempSrc: Oral  Oral   SpO2: 100% 100% 100% 99%  Weight:      Height:       Physical Exam  Constitutional: In no  distress. Thin  Cardiovascular: Normal rate, regular rhythm. No lower extremity edema  Pulmonary: Non labored breathing on room air, no wheezing or rales.  Abdominal: Soft. Normal bowel sounds. Non distended and non tender Musculoskeletal: Normal range of motion.     Neurological: Alert and oriented to person, place, and time. Non focal  Skin: Skin is warm and dry.   Data Reviewed: Ferritin 19 last year.  Hemoglobin 6.9, creatinine 1.48 glucose 24 on chemistry when he came in. I have reviewed all labs and images.   Family Communication: None  Disposition: Status is: Inpatient Remains inpatient appropriate because: labile blood glucose   Planned Discharge Destination: To be determined    Time spent: 30 minutes  Author: Alban Pepper, MD 04/08/2023 5:59 PM  For on call review [email or web address].

## 2023-04-08 NOTE — Plan of Care (Signed)
  Problem: Fluid Volume: Goal: Hemodynamic stability will improve Outcome: Progressing   Problem: Clinical Measurements: Goal: Diagnostic test results will improve Outcome: Progressing Goal: Signs and symptoms of infection will decrease Outcome: Progressing   Problem: Clinical Measurements: Goal: Signs and symptoms of infection will decrease Outcome: Progressing   Problem: Respiratory: Goal: Ability to maintain adequate ventilation will improve Outcome: Progressing

## 2023-04-08 NOTE — Inpatient Diabetes Management (Signed)
 Inpatient Diabetes Program Recommendations  AACE/ADA: New Consensus Statement on Inpatient Glycemic Control   Target Ranges:  Prepandial:   less than 140 mg/dL      Peak postprandial:   less than 180 mg/dL (1-2 hours)      Critically ill patients:  140 - 180 mg/dL    Latest Reference Range & Units 04/07/23 07:50 04/07/23 10:18 04/07/23 11:41 04/07/23 12:35 04/07/23 16:06 04/07/23 20:57 04/08/23 08:11 04/08/23 09:17 04/08/23 11:37  Glucose-Capillary 70 - 99 mg/dL 742 (H)  Novolog  3 units @8 :25   Novolog  2 units @10 :18  Semglee  3 units @10 :28 124 (H) 104 (H) 102 (H) 183 (H) 56 (L) 132 (H) 224 (H)   Review of Glycemic Control  Diabetes history: DM1 Outpatient Diabetes medications:  Semglee  2-10 units daily (based on scale), Novolog  0-9 units TID with meals for meal coverage and correction based on scale Current orders for Inpatient glycemic control: Semglee  2 units daily, Novolog  0-6 units TID with meals, Novolog  0-5 units at bedtime, Novolog  2 units TID with meals  Inpatient Diabetes Program Recommendations:    Insulin : Noted Semglee  decreased from 3 units to 2 units daily today due to CBG of 56 mg/dl this morning. Patient has DM1 and will need basal insulin  to prevent DKA. Agree with Semglee  2 units daily.  Outpatient DM: At time of discharge, please provide Rx for Dexcom G7 sensors [phone]).  NOTE: Spoke to patient at bedside regarding DM control. Patient states that he has Type 1 DM and that he takes Semglee  on a sliding scale and Novolog  before meals on a sliding scale. Asked to provide more information on scale and patient states the Semglee  is anywhere from 2-10 units daily based on glucose and scale and the Novolog  is 0-9 units based on glucose and sliding scale. Patient states that he has been doing finger sticks lately but he had been using a Dexcom G7 and he liked using it. Patient states that he does not have anymore Dexcom G7 sensors at home (will ask for Rx for Dexcom G7  sensors at discharge. Patient states that his glucose is usually in the 100's mg/dl and that he has at least 3-4 hypoglycemic episodes per week.  Patient reports that he drinks juice and eats something when he has hypoglycemia. Discussed treating hypoglycemia with 15 grams of carbs and waiting 15 minutes to recheck glucose and make sure glucose has improved. Discussed that if hypoglycemia is overtreated it leads to hyperglycemia and that would be impacting A1C results. Discussed most current A1C of 10.7% on 03/26/23 and explained that most current A1C indicates an average glucose of 260 mg/dl. Patient states that he glucose does not run that high at home. Patient states he has been seeing an Endocrinologist but has not seen him in a long time. Explained that if he is having hypoglycemia that frequently, he likely needs to have insulin  regimen adjusted.  Encouraged patient to reach out to Endocrinologist to see if he can make an appointment in near future for assistance with DM management. Patient states he has all needed DM testing supplies and medications at home.  Patient verbalized understanding of information discussed and has no questions at this time. Per chart patient sees [facility] and was last seen 08/11/22 by KYM Cohn, PA.   Thanks, Earnie Gainer, RN, MSN, CDCES Diabetes Coordinator Inpatient Diabetes Program [phone] (Team Pager from 8am to 5pm)

## 2023-04-09 DIAGNOSIS — E10649 Type 1 diabetes mellitus with hypoglycemia without coma: Principal | ICD-10-CM

## 2023-04-09 LAB — GLUCOSE, CAPILLARY
Glucose-Capillary: 67 mg/dL — ABNORMAL LOW (ref 70–99)
Glucose-Capillary: 129 mg/dL — ABNORMAL HIGH (ref 70–99)
Glucose-Capillary: 207 mg/dL — ABNORMAL HIGH (ref 70–99)
Glucose-Capillary: 184 mg/dL — ABNORMAL HIGH (ref 70–99)
Glucose-Capillary: 153 mg/dL — ABNORMAL HIGH (ref 70–99)
Glucose-Capillary: 21 mg/dL — CL (ref 70–99)
Glucose-Capillary: 23 mg/dL — CL (ref 70–99)
Glucose-Capillary: 190 mg/dL — ABNORMAL HIGH (ref 70–99)

## 2023-04-09 LAB — BASIC METABOLIC PANEL
Anion gap: 7 (ref 5–15)
BUN: 24 mg/dL — ABNORMAL HIGH (ref 8–23)
CO2: 25 mmol/L (ref 22–32)
Calcium: 8 mg/dL — ABNORMAL LOW (ref 8.9–10.3)
Chloride: 100 mmol/L (ref 98–111)
Creatinine, Ser: 1.25 mg/dL — ABNORMAL HIGH (ref 0.61–1.24)
GFR, Estimated: >60 mL/min (ref >60)
Glucose, Bld: 166 mg/dL — ABNORMAL HIGH (ref 70–99)
Potassium: 4.6 mmol/L (ref 3.5–5.1)
Sodium: 132 mmol/L — ABNORMAL LOW (ref 135–145)

## 2023-04-09 LAB — CBC
HCT: 26.1 % — ABNORMAL LOW (ref 39.0–52.0)
Hemoglobin: 8.8 g/dL — ABNORMAL LOW (ref 13.0–17.0)
MCH: 31 pg (ref 26.0–34.0)
MCHC: 33.7 g/dL (ref 30.0–36.0)
MCV: 91.9 fL (ref 80.0–100.0)
Platelets: 271 10*3/uL (ref 150–400)
RBC: 2.84 MIL/uL — ABNORMAL LOW (ref 4.22–5.81)
RDW: 15.9 % — ABNORMAL HIGH (ref 11.5–15.5)
WBC: 4.8 10*3/uL (ref 4.0–10.5)
nRBC: 0 % (ref 0.0–0.2)

## 2023-04-09 MED ORDER — GLUCOSE 4 G PO CHEW
1.0000 | CHEWABLE_TABLET | Freq: Once | ORAL | Status: AC
Start: 2023-04-09 — End: 2023-04-09
  Administered 2023-04-09: 4 g via ORAL

## 2023-04-09 MED ORDER — DEXTROSE 50 % IV SOLN
1.0000 | Freq: Once | INTRAVENOUS | Status: AC
  Administered 2023-04-09: 50 mL via INTRAVENOUS

## 2023-04-09 MED ORDER — PANTOPRAZOLE SODIUM 40 MG PO TBEC
40.0000 mg | DELAYED_RELEASE_TABLET | Freq: Every day | ORAL | Status: DC
  Administered 2023-04-10 – 2023-04-11 (×2): 40 mg via ORAL
  Filled 2023-04-09 (×2): qty 1

## 2023-04-09 MED ORDER — DEXTROSE 50 % IV SOLN: 1.0000 | INTRAVENOUS | Status: DC | PRN

## 2023-04-09 MED ORDER — LABETALOL HCL 5 MG/ML IV SOLN: 10.0000 mg | INTRAVENOUS | Status: DC | PRN

## 2023-04-09 MED ORDER — GLUCOSE 4 G PO CHEW
CHEWABLE_TABLET | ORAL | Status: AC
  Filled 2023-04-09: qty 1

## 2023-04-09 MED ORDER — DEXTROSE 50 % IV SOLN
INTRAVENOUS | Status: AC
  Filled 2023-04-09: qty 50

## 2023-04-09 NOTE — Inpatient Diabetes Management (Addendum)
 Inpatient Diabetes Program Recommendations  AACE/ADA: New Consensus Statement on Inpatient Glycemic Control   Target Ranges:  Prepandial:   less than 140 mg/dL      Peak postprandial:   less than 180 mg/dL (1-2 hours)      Critically ill patients:  140 - 180 mg/dL    Latest Reference Range & Units 04/08/23 08:11 04/08/23 09:17 04/08/23 11:37 04/08/23 13:42 04/08/23 17:29 04/08/23 21:03 04/09/23 02:12 04/09/23 02:55  Glucose-Capillary 70 - 99 mg/dL 56 (L) 867 (H) 775 (H)   Novolog  4 units  Semglee  2 units @13 :42  97 262 (H)  Novolog  3 units @21 :24 67 (L) 129 (H)   Review of Glycemic Control   Diabetes history: DM1 Outpatient Diabetes medications:  Semglee  2-10 units daily (based on scale), Novolog  0-9 units TID with meals for meal coverage and correction based on scale Current orders for Inpatient glycemic control: Semglee  2 units daily, Novolog  0-6 units TID with meals, Novolog  0-5 units at bedtime, Novolog  2 units TID with meals   Inpatient Diabetes Program Recommendations:     Insulin : CBG down to 67 mg/dl at 7:87 am today. Anticipate hypoglycemia due to getting Novolog  3 units correction at bedtime on 04/08/23. Please consider discontinuing Novolog  0-5 units at bedtime.  Thanks, Earnie Gainer, RN, MSN, CDCES Diabetes Coordinator Inpatient Diabetes Program [phone] (Team Pager from 8am to 5pm)

## 2023-04-09 NOTE — Plan of Care (Signed)
  Problem: Fluid Volume: Goal: Hemodynamic stability will improve Outcome: Progressing   Problem: Clinical Measurements: Goal: Diagnostic test results will improve Outcome: Progressing Goal: Signs and symptoms of infection will decrease Outcome: Progressing   Problem: Respiratory: Goal: Ability to maintain adequate ventilation will improve Outcome: Progressing   Problem: Education: Goal: Ability to describe self-care measures that may prevent or decrease complications (Diabetes Survival Skills Education) will improve Outcome: Progressing Goal: Individualized Educational Video(s) Outcome: Progressing   Problem: Coping: Goal: Ability to adjust to condition or change in health will improve Outcome: Progressing

## 2023-04-09 NOTE — Progress Notes (Signed)
 OT Cancellation Note  Patient Details Name: RAMAL ECKHARDT MRN: [id number] DOB: [birth year]   Cancelled Treatment:    Reason Eval/Treat Not Completed: Patient declined, no reason specified. OT order received. Chart reviewed. [place] arrival to pt room, pt with covers pulled over head. Verbally acknowledges OT in room but does not emerge from blankets. Declines OT services at this time, stating he fell this morning. Does not wish to participate at this time. Will hold and re-attempt at a later date/time as available and pt medically appropriate for OT services.   Jhonny Pelton, M.S., OTR/L 04/09/23, 2:02 PM

## 2023-04-09 NOTE — Progress Notes (Signed)
 At shift change on bedside shift report, nurse tech (NT) from day and night shifts had just exited room when bed alarm went off. NT walked into room and attempted to assist patient hands-on with ambulating. Patient remained unsteady and attempted to walk more quickly than able and lost his balance. NT assisted patient to floor where he landed on his right side, did not hit his head, and was able to get up with assistance and back into bed. NT and RN response was immediate- see flowsheet.   04/09/23 0717  What Happened  Was fall witnessed? Yes  Who witnessed fall? Evalene and Jon (NTs)  Patients activity before fall ambulating-assisted  Point of contact other (comment) (right side)  Was patient injured? No  Provider Notification  Provider Name/Title Calvin Robson  Date Provider Notified 04/09/23  Time Provider Notified 0715  Method of Notification Page  Notification Reason Fall  Provider response See new orders (tele monitoring)  Date of Provider Response 04/09/23  Time of Provider Response 0730  Adult Fall Risk Assessment  Risk Factor Category (scoring not indicated) High fall risk per protocol (document High fall risk)  Age 73  Fall History: Fall within 6 months prior to admission 5  Elimination; Bowel and/or Urine Incontinence 0  Elimination; Bowel and/or Urine Urgency/Frequency 2  Medications: includes PCA/Opiates, Anti-convulsants, Anti-hypertensives, Diuretics, Hypnotics, Laxatives, Sedatives, and Psychotropics 5  Patient Care Equipment 0  Mobility-Assistance 2  Mobility-Gait 2  Mobility-Sensory Deficit 0  Altered awareness of immediate physical environment 1  Impulsiveness 2  Lack of understanding of one's physical/cognitive limitations 4  Total Score 25  Patient Fall Risk Level High fall risk  Adult Fall Risk Interventions  Required Bundle Interventions *See Row Information* High fall risk - low, moderate, and high requirements implemented  Additional Interventions Use  of appropriate toileting equipment (bedpan, BSC, etc.);Room near nurses station;Camera surveillance (with patient/family notification & education)  Fall intervention(s) refused/Patient educated regarding refusal Bed alarm;Nonskid socks;Open door if unsupervised;Yellow bracelet;Supervision while toileting/edge of bed sitting  Screening for Fall Injury Risk (To be completed on HIGH fall risk patients) - Assessing Need for Floor Mats  Risk For Fall Injury- Criteria for Floor Mats Previous fall this admission  Will Implement Floor Mats Yes  Vitals  Temp 97.6 F (36.4 C)  Temp Source Oral  BP (!) 171/82  MAP (mmHg) 103  BP Location Left Arm  BP Method Automatic  Patient Position (if appropriate) Sitting  Pulse Rate 96  Pulse Rate Source Monitor  Resp 20  Oxygen Therapy  SpO2 100 %  O2 Device Room Air  O2 Flow Rate (L/min) 0 L/min  Patient Activity (if Appropriate) In bed  Pain Assessment  Pain Scale 0-10  Pain Score 0  PCA/Epidural/Spinal Assessment  Respiratory Pattern Regular;Unlabored  Neurological  Neuro (WDL) X  Level of Consciousness Alert  Orientation Level Oriented to person;Oriented to place;Oriented to situation;Disoriented to time  Cognition Follows commands;Impulsive  Speech Clear  R Pupil Size (mm) 4  R Pupil Shape Round  R Pupil Reaction Brisk  L Pupil Size (mm) 4  L Pupil Shape Round  L Pupil Reaction Brisk  Motor Function/Sensation Assessment Grip;Dorsiflexion;Plantar flexion  R Hand Grip Strong  L Hand Grip Strong  R Foot Dorsiflexion Moderate  L Foot Dorsiflexion Moderate  R Foot Plantar Flexion Strong  L Foot Plantar Flexion Strong  RUE Motor Response Responds to commands  RUE Sensation Full sensation  RUE Motor Strength 5  LUE Motor Response Responds to commands  LUE Sensation Full sensation  LUE Motor Strength 5  RLE Motor Response Responds to commands  RLE Sensation Full sensation  RLE Motor Strength 5  LLE Motor Response Responds to commands   LLE Sensation Full sensation  LLE Motor Strength 5  Glasgow Coma Scale  Eye Opening 4  Best Verbal Response (NON-intubated) 4  Best Motor Response 6  Glasgow Coma Scale Score 14  NIH Stroke Scale   Dizziness Present No  Headache Present No  Interval Other (Comment) (post fall)  Level of Consciousness (1a.)    0  LOC Questions (1b. )    0  LOC Commands (1c. )    0  Best Gaze (2. )   0  Visual (3. )   0  Facial Palsy (4. )     0  Motor Arm, Left (5a. )    0  Motor Arm, Right (5b. )  0  Motor Leg, Left (6a. )   0  Motor Leg, Right (6b. )  0  Limb Ataxia (7. ) 0  Sensory (8. )   0  Best Language (9. )   0  Dysarthria (10. ) 0  Extinction/Inattention (11.)    0  Complete NIHSS TOTAL 0  Musculoskeletal  Musculoskeletal (WDL) X  Assistive Device None  Generalized Weakness Yes  Musculoskeletal Details  Left Toes Amputated toes  Integumentary  Integumentary (WDL) WDL

## 2023-04-09 NOTE — Care Management Important Message (Signed)
 Important Message  Patient Details  Name: Nicholas Weiss MRN: [id number] Date of Birth: Jun 06, 1950   Important Message Given:  Yes - Medicare IM     Sherilyn Banker 04/09/2023, 1:44 PM

## 2023-04-09 NOTE — Progress Notes (Signed)
 Hypoglycemic Event  CBG: 21  Treatment: 8 oz juice/soda and D50 25 mL (12.5 gm)  Symptoms: Shaky  Follow-up CBG: CBG Result:153  Possible Reasons for Event: Unknown  Comments/MD notified: Dr. Calvin Robson made aware. Hypoglycemic protocol initiated.    Nicholas Weiss

## 2023-04-09 NOTE — Plan of Care (Signed)
  Problem: Respiratory: Goal: Ability to maintain adequate ventilation will improve Outcome: Adequate for Discharge   Problem: Education: Goal: Ability to describe self-care measures that may prevent or decrease complications (Diabetes Survival Skills Education) will improve Outcome: Progressing   Problem: Coping: Goal: Ability to adjust to condition or change in health will improve Outcome: Progressing   Problem: Fluid Volume: Goal: Ability to maintain a balanced intake and output will improve Outcome: Adequate for Discharge

## 2023-04-09 NOTE — TOC Progression Note (Addendum)
 Transition of Care [facility]) - Progression Note    Patient Details  Name: Nicholas Weiss MRN: [id number] Date of Birth: [birth year]  Transition of Care [facility]) CM/SW Contact  Tomasa JAYSON Childes, RN Phone Number: 04/09/2023, 1:23 PM  Clinical Narrative:    Spoke with patient's friend [name], regarding HH. She is agreeable to patient returning home with [facility]. She does not have a preference.   Referral sent to and accepted [facility] from [facility].         Expected Discharge Plan and Services                                               Social Determinants of Health (SDOH) Interventions SDOH Screenings   Food Insecurity: Patient Declined (04/06/2023)  Housing: Patient Unable To Answer (03/27/2023)  Transportation Needs: Patient Unable To Answer (03/27/2023)  Utilities: Patient Unable To Answer (03/27/2023)  Alcohol Screen: Low Risk  (10/02/2020)  Depression (PHQ2-9): Medium Risk (10/02/2020)  Financial Resource Strain: Low Risk  (01/26/2023)   Received from [facility]  Physical Activity: Insufficiently Active (01/01/2020)  Social Connections: Moderately Isolated (03/30/2023)  Stress: No Stress Concern Present (01/01/2020)  Tobacco Use: Low Risk  (04/05/2023)  Recent Concern: Tobacco Use - Medium Risk (02/15/2023)   Received from [facility]    Readmission Risk Interventions    02/16/2023    3:56 PM 08/24/2022   10:57 AM 05/20/2022   11:41 AM  Readmission Risk Prevention Plan  Transportation Screening Complete Complete Complete  Medication Review Oceanographer) Complete Complete Complete  PCP or Specialist appointment within 3-5 days of discharge Complete Complete Complete  HRI or Home Care Consult Complete Complete Complete  SW Recovery Care/Counseling Consult Not Complete    Palliative Care Screening Not Applicable  Complete  Skilled Nursing Facility Complete Complete Complete

## 2023-04-09 NOTE — Progress Notes (Signed)
 Physical Therapy Treatment Patient Details Name: Nicholas Weiss MRN: [id number] DOB: September 30, 1950 Today's Date: 04/09/2023   History of Present Illness Patient is a 73 year old male presenting with altered mental status, was bradycardic, hypotensive, hypothermic, hypoglycemic. History of CVA, HTN, multiple myeloma, CKD, paroxysmal A-fib, alcohol abuse, cocaine abuse.    PT Comments  Pt was long sitting in bed upon arrival. He is alert and endorses having fall earlier in the day. He did not endorse any pain. Orthostatic vitals checked throughout session with BP MAP > 65. Pt did not endorse dizziness or fatigue.  Tolerated ambulation well but distance limited by urge to need to urinate. Overall tolerates session well. DC recs remains appropriate, pt seems close to baseline. Per pt, has a girlfriend willing to assist him after DC.    If plan is discharge home, recommend the following: A little help with bathing/dressing/bathroom;Assistance with cooking/housework;Assist for transportation;Help with stairs or ramp for entrance     Equipment Recommendations  None recommended by PT       Precautions / Restrictions Precautions Precautions: Fall Restrictions Weight Bearing Restrictions Per Provider Order: No     Mobility  Bed Mobility Overal bed mobility: Modified Independent Bed Mobility: Supine to Sit  Supine to sit: Supervision, HOB elevated, Used rails Sit to supine: Supervision   Transfers Overall transfer level: Modified independent Equipment used: Rolling walker (2 wheels) Transfers: Sit to/from Stand Sit to Stand: Contact guard assist  General transfer comment: CGA for safety    Ambulation/Gait Ambulation/Gait assistance: Contact guard assist Gait Distance (Feet): 40 Feet Assistive device: Rolling walker (2 wheels) Gait Pattern/deviations: Step-through pattern Gait velocity: decreased  General Gait Details: pt tolerated ambulation ~ 40 ft. distance limited by urge to pee. No  c/o dizziness. BP MAP > 65 throughout session    Balance Overall balance assessment: Needs assistance Sitting-balance support: Feet supported Sitting balance-Leahy Scale: Good     Standing balance support: Bilateral upper extremity supported, During functional activity, Reliant on assistive device for balance Standing balance-Leahy Scale: Fair       Cognition Arousal: Alert Behavior During Therapy: WFL for tasks assessed/performed Overall Cognitive Status: No family/caregiver present to determine baseline cognitive functioning      Following Commands: Follows one step commands inconsistently, Follows one step commands with increased time Safety/Judgement: Decreased awareness of safety, Decreased awareness of deficits Awareness: Intellectual Problem Solving: Slow processing, Decreased initiation, Requires verbal cues, Requires tactile cues General Comments: Patient is cooperative and able to follow single step commands with increased time. Mildly impulsive with activity with safety cues required               Pertinent Vitals/Pain Pain Assessment Pain Assessment: 0-10 Pain Score: 0-No pain Pain Intervention(s): Limited activity within patient's tolerance, Monitored during session, Premedicated before session, Repositioned     PT Goals (current goals can now be found in the care plan section) Acute Rehab PT Goals Patient Stated Goal: to go home from the hospital Progress towards PT goals: Progressing toward goals    Frequency    Min 1X/week       Co-evaluation     PT goals addressed during session: Mobility/safety with mobility        AM-PAC PT 6 Clicks Mobility   Outcome Measure  Help needed turning from your back to your side while in a flat bed without using bedrails?: None Help needed moving from lying on your back to sitting on the side of a flat bed without using  bedrails?: None Help needed moving to and from a bed to a chair (including a  wheelchair)?: None Help needed standing up from a chair using your arms (e.g., wheelchair or bedside chair)?: A Little Help needed to walk in hospital room?: A Little Help needed climbing 3-5 steps with a railing? : A Little 6 Click Score: 21    End of Session   Activity Tolerance: Patient tolerated treatment well Patient left: in bed;with call bell/phone within reach;with bed alarm set Nurse Communication: Mobility status PT Visit Diagnosis: Unsteadiness on feet (R26.81);Muscle weakness (generalized) (M62.81)     Time: 9097-9081 PT Time Calculation (min) (ACUTE ONLY): 16 min  Charges:    $Gait Training: 8-22 mins PT General Charges $$ ACUTE PT VISIT: 1 Visit                    Rankin Essex PTA 04/09/23, 12:54 PM

## 2023-04-09 NOTE — Progress Notes (Signed)
 PROGRESS NOTE    Nicholas Weiss  FMW:[id number] DOB: 1951-03-30 DOA: 04/05/2023 PCP: Alla Amis, MD    Brief Narrative:   73 year old man past medical history of alcohol abuse, cocaine abuse, hypertension, prior CVA, type 1 diabetes mellitus, multiple myeloma, chronic kidney disease, paroxysmal atrial fibrillation on Eliquis  presents with altered mental status.  Patient was bradycardic, hypotensive on presentation and also hypothermic and altered mental status.  Patient had a very low sugar.   1/7.  Patient agreeable to blood transfusion and hemoglobin of 6.9.  Patient sugar now above 400 starting on Semglee  insulin  sliding scale and NovoLog  prior to meals.  Patient very labile with his sugars.  1/10: Patient suffered fall, mechanical this a.m.  No evidence of head trauma.  Immediate response from nursing and patient care tech team   Assessment & Plan:   Principal Problem:   Uncontrolled type 1 diabetes mellitus with hypoglycemia, with long-term current use of insulin  (HCC) Active Problems:   Acute metabolic encephalopathy   Hypotension   Hypothermia   Seizure disorder (HCC)   CKD (chronic kidney disease) stage 3, GFR 30-59 ml/min (HCC)   Paroxysmal atrial fibrillation (HCC)   Iron  deficiency anemia   Uncontrolled type 1 diabetes mellitus with hypoglycemia, with long-term current use of insulin  (HCC) Remains labile.  Recurrent episodes of hypoglycemia Plan: DC nightly coverage Very sensitive sliding scale Semglee  2 units daily NovoLog  2 units 3 times daily with meals  Diabetes educator saw patient.  Outpatient endocrine referral Will discharge with prescription for Dexcom     Seizure disorder (HCC) Continue PO keppra     CKD (chronic kidney disease) stage 3, GFR 30-59 ml/min (HCC) CKD stage IIIa.  Creatinine 1.48 with a GFR 51.  Stable.    Hyponatremia  Mild CTM    Acute metabolic encephalopathy Resolved  Likely secondary to low sugar.   Hypothermia  Resolved Unclear if an infection.  Patient placed on empiric antibiotics.  Blood cultures negative. Discontinued antibiotics 1/8 AM.   Hypotension Resolved.  Normotensive. Continue to Hold hydralazine .   Paroxysmal atrial fibrillation (HCC) Eliquis  resumed this PM      Iron  deficiency anemia Likely component of anemia of CKD  Stable.      DVT prophylaxis: Apixaban  Code Status: Full Family Communication: None Disposition Plan: Status is: Inpatient Remains inpatient appropriate because: Symptomatic hypoglycemia and resultant fall   Level of care: Med-Surg  Consultants:  None  Procedures:  None  Antimicrobials: None   Subjective: Seen and examined.  On arrival patient in bed with blankets pulled over her head.  Will not emerged from blankets.  Will not engage in conversation.  Does acknowledge my arrival.  Objective: Vitals:   04/09/23 0439 04/09/23 0717 04/09/23 0759 04/09/23 1128  BP: (!) 154/112 (!) 171/82 (!) 170/119 132/84  Pulse: 89 96 72 91  Resp: 18 20 16 16   Temp:  97.6 F (36.4 C) 97.9 F (36.6 C) 98.3 F (36.8 C)  TempSrc:  Oral    SpO2: 100% 100% 100% 100%  Weight:      Height:        Intake/Output Summary (Last 24 hours) at 04/09/2023 1449 Last data filed at 04/09/2023 0900 Gross per 24 hour  Intake --  Output 900 ml  Net -900 ml   Filed Weights   04/05/23 1906  Weight: 61.9 kg    Examination:  General exam: NAD Respiratory system: Clear to auscultation. Respiratory effort normal. Cardiovascular system: S1-S2, RRR, no murmurs, no pedal edema Gastrointestinal  system: Thin, soft, NT/ND, normal bowel sounds Central nervous system: Unable to assess orientation.  Alert and awake Extremities: Unable to assess Skin: No rashes, lesions or ulcers Psychiatry: Unable to assess    Data Reviewed: I have personally reviewed following labs and imaging studies  CBC: Recent Labs  Lab 04/05/23 1854 04/06/23 0500 04/07/23 0509 04/08/23 0902  04/09/23 0501  WBC 7.4 5.7 7.0 6.4 4.8  NEUTROABS 3.0  --   --   --   --   HGB 7.8* 6.9* 8.7* 8.8* 8.8*  HCT 24.8* 21.6* 25.8* 26.6* 26.1*  MCV 99.2 97.3 91.8 91.7 91.9  PLT 337 230 273 267 271   Basic Metabolic Panel: Recent Labs  Lab 04/05/23 1854 04/06/23 0500 04/07/23 0509 04/08/23 0902 04/09/23 0501  NA 140 138 139 133* 132*  K 2.9* 4.6 4.6 4.0 4.6  CL 107 108 109 102 100  CO2 23 21* 21* 22 25  GLUCOSE 24* 224* 278* 99 166*  BUN 40* 41* 37* 26* 24*  CREATININE 1.63* 1.48* 1.50* 1.25* 1.25*  CALCIUM  7.9* 7.5* 8.3* 8.2* 8.0*  MG 2.0  --   --   --   --    GFR: Estimated Creatinine Clearance: 48.1 mL/min (A) (by C-G formula based on SCr of 1.25 mg/dL (H)). Liver Function Tests: Recent Labs  Lab 04/05/23 1854 04/06/23 0500  AST 48* 28  ALT 41 34  ALKPHOS 64 59  BILITOT 0.5 0.5  PROT 6.7 6.3*  ALBUMIN 2.6* 2.4*   Recent Labs  Lab 04/05/23 1854  LIPASE 25   Recent Labs  Lab 04/05/23 1854  AMMONIA  24   Coagulation Profile: Recent Labs  Lab 04/06/23 0500  INR 1.5*   Cardiac Enzymes: Recent Labs  Lab 04/06/23 0500  CKTOTAL 50   BNP (last 3 results) No results for input(s): PROBNP in the last 8760 hours. HbA1C: No results for input(s): HGBA1C in the last 72 hours. CBG: Recent Labs  Lab 04/08/23 2103 04/09/23 0212 04/09/23 0255 04/09/23 0754 04/09/23 1127  GLUCAP 262* 67* 129* 207* 184*   Lipid Profile: No results for input(s): CHOL, HDL, LDLCALC, TRIG, CHOLHDL, LDLDIRECT in the last 72 hours. Thyroid  Function Tests: No results for input(s): TSH, T4TOTAL, FREET4, T3FREE, THYROIDAB in the last 72 hours. Anemia Panel: No results for input(s): VITAMINB12, FOLATE, FERRITIN, TIBC, IRON , RETICCTPCT in the last 72 hours. Sepsis Labs: Recent Labs  Lab 04/05/23 1854 04/05/23 2326 04/06/23 0500  LATICACIDVEN 3.4* 1.5 1.2    Recent Results (from the past 240 hours)  Blood culture (routine x 2)     Status:  None (Preliminary result)   Collection Time: 04/05/23  7:35 PM   Specimen: BLOOD  Result Value Ref Range Status   Specimen Description BLOOD BLOOD RIGHT HAND  Final   Special Requests   Final    BOTTLES DRAWN AEROBIC AND ANAEROBIC Blood Culture results may not be optimal due to an inadequate volume of blood received in culture bottles   Culture   Final    NO GROWTH 4 DAYS Performed at [facility], [address], [place], [place] [address]    Report Status PENDING  Incomplete  Blood culture (routine x 2)     Status: None (Preliminary result)   Collection Time: 04/05/23  7:45 PM   Specimen: BLOOD  Result Value Ref Range Status   Specimen Description BLOOD BLOOD LEFT HAND  Final   Special Requests   Final    BOTTLES DRAWN AEROBIC AND ANAEROBIC  Blood Culture results may not be optimal due to an inadequate volume of blood received in culture bottles   Culture   Final    NO GROWTH 4 DAYS Performed at [facility], [address] [place], [place] [address]    Report Status PENDING  Incomplete  MRSA Next Gen by PCR, Nasal     Status: None   Collection Time: 04/08/23  1:34 AM   Specimen: Nasal Mucosa; Nasal Swab  Result Value Ref Range Status   MRSA by PCR Next Gen NOT DETECTED NOT DETECTED Final    Comment: (NOTE) The GeneXpert MRSA Assay (FDA approved for NASAL specimens only), is one component of a comprehensive MRSA colonization surveillance program. It is not intended to diagnose MRSA infection nor to guide or monitor treatment for MRSA infections. Test performance is not FDA approved in patients less than 66 years old. Performed at [facility], [address] [place], [place] [address]          Radiology Studies: No results found.      Scheduled Meds:  apixaban   5 mg Oral BID   atorvastatin   40 mg Oral Daily   insulin  aspart  0-6 Units Subcutaneous TID WC   insulin  aspart  2 Units Subcutaneous TID WC   insulin  glargine-yfgn  2  Units Subcutaneous Daily   levETIRAcetam   500 mg Oral BID   pantoprazole  (PROTONIX ) IV  80 mg Intravenous Q12H   sodium chloride  flush  3 mL Intravenous Q12H   Continuous Infusions:   LOS: 3 days       Calvin KATHEE Robson, MD [facility]   If 7PM-7AM, please contact night-coverage  04/09/2023, 2:49 PM

## 2023-04-09 NOTE — Progress Notes (Signed)
 Hypoglycemic Event  CBG: 67  Treatment: 1 tube glucose gel  Symptoms: Nervous/irritable  Follow-up CBG: Time:255 CBG Result:129  Possible Reasons for Event: Inadequate meal intake  Comments/MD notified:n/a     Eloisa Northern

## 2023-04-10 DIAGNOSIS — E10649 Type 1 diabetes mellitus with hypoglycemia without coma: Secondary | ICD-10-CM | POA: Diagnosis not present

## 2023-04-10 LAB — TYPE AND SCREEN
ABO/RH(D): A POS
Antibody Screen: POSITIVE
DAT, IgG: NEGATIVE
DAT, complement: NEGATIVE
Unit division: 0
Unit division: 0
Unit division: 0

## 2023-04-10 LAB — BPAM RBC
Blood Product Expiration Date: 202501222359
Blood Product Expiration Date: 202501302359
Blood Product Expiration Date: 202501312359
ISSUE DATE / TIME: 202501071257
Unit Type and Rh: 5100
Unit Type and Rh: 600
Unit Type and Rh: 6200

## 2023-04-10 LAB — BASIC METABOLIC PANEL WITH GFR
Anion gap: 7 (ref 5–15)
BUN: 20 mg/dL (ref 8–23)
CO2: 25 mmol/L (ref 22–32)
Calcium: 7.9 mg/dL — ABNORMAL LOW (ref 8.9–10.3)
Chloride: 97 mmol/L — ABNORMAL LOW (ref 98–111)
Creatinine, Ser: 1.24 mg/dL (ref 0.61–1.24)
GFR, Estimated: >60 mL/min
Glucose, Bld: 348 mg/dL — ABNORMAL HIGH (ref 70–99)
Potassium: 4.4 mmol/L (ref 3.5–5.1)
Sodium: 129 mmol/L — ABNORMAL LOW (ref 135–145)

## 2023-04-10 LAB — CBC WITH DIFFERENTIAL/PLATELET
Abs Immature Granulocytes: 0.01 10*3/uL (ref 0.00–0.07)
Basophils Absolute: 0 10*3/uL (ref 0.0–0.1)
Basophils Relative: 0 %
Eosinophils Absolute: 0.1 10*3/uL (ref 0.0–0.5)
Eosinophils Relative: 3 %
HCT: 28.1 % — ABNORMAL LOW (ref 39.0–52.0)
Hemoglobin: 9.9 g/dL — ABNORMAL LOW (ref 13.0–17.0)
Immature Granulocytes: 0 %
Lymphocytes Relative: 26 %
Lymphs Abs: 1.1 10*3/uL (ref 0.7–4.0)
MCH: 31.4 pg (ref 26.0–34.0)
MCHC: 35.2 g/dL (ref 30.0–36.0)
MCV: 89.2 fL (ref 80.0–100.0)
Monocytes Absolute: 0.4 10*3/uL (ref 0.1–1.0)
Monocytes Relative: 10 %
Neutro Abs: 2.4 10*3/uL (ref 1.7–7.7)
Neutrophils Relative %: 61 %
Platelets: 246 10*3/uL (ref 150–400)
RBC: 3.15 MIL/uL — ABNORMAL LOW (ref 4.22–5.81)
RDW: 15.1 % (ref 11.5–15.5)
WBC: 4 10*3/uL (ref 4.0–10.5)
nRBC: 0 % (ref 0.0–0.2)

## 2023-04-10 LAB — GLUCOSE, CAPILLARY
Glucose-Capillary: 268 mg/dL — ABNORMAL HIGH (ref 70–99)
Glucose-Capillary: 236 mg/dL — ABNORMAL HIGH (ref 70–99)
Glucose-Capillary: 229 mg/dL — ABNORMAL HIGH (ref 70–99)
Glucose-Capillary: 79 mg/dL (ref 70–99)
Glucose-Capillary: 132 mg/dL — ABNORMAL HIGH (ref 70–99)

## 2023-04-10 LAB — CULTURE, BLOOD (ROUTINE X 2)
Culture: NO GROWTH
Culture: NO GROWTH

## 2023-04-10 MED ORDER — HYDRALAZINE HCL 25 MG PO TABS
25.0000 mg | ORAL_TABLET | Freq: Three times a day (TID) | ORAL | Status: DC
  Administered 2023-04-10 – 2023-04-11 (×3): 25 mg via ORAL
  Filled 2023-04-10 (×3): qty 1

## 2023-04-10 MED ORDER — TRESIBA FLEXTOUCH 100 UNIT/ML ~~LOC~~ SOPN: 5.0000 [IU] | PEN_INJECTOR | Freq: Every day | SUBCUTANEOUS | Status: DC

## 2023-04-10 MED ORDER — ACETAMINOPHEN 325 MG PO TABS
650.0000 mg | ORAL_TABLET | Freq: Four times a day (QID) | ORAL | Status: DC | PRN
  Administered 2023-04-10 – 2023-04-11 (×2): 650 mg via ORAL
  Filled 2023-04-10: qty 2

## 2023-04-10 MED ORDER — DEXCOM G7 SENSOR MISC: 1.0000 | Freq: Three times a day (TID) | 0 refills | Status: DC

## 2023-04-10 NOTE — Plan of Care (Signed)
  Problem: Fluid Volume: Goal: Hemodynamic stability will improve Outcome: Progressing   Problem: Clinical Measurements: Goal: Diagnostic test results will improve Outcome: Progressing Goal: Signs and symptoms of infection will decrease Outcome: Progressing   Problem: Respiratory: Goal: Ability to maintain adequate ventilation will improve Outcome: Progressing   Problem: Education: Goal: Ability to describe self-care measures that may prevent or decrease complications (Diabetes Survival Skills Education) will improve Outcome: Progressing Goal: Individualized Educational Video(s) Outcome: Progressing

## 2023-04-10 NOTE — Plan of Care (Signed)
  Problem: Coping: Goal: Ability to adjust to condition or change in health will improve Outcome: Progressing   Problem: Metabolic: Goal: Ability to maintain appropriate glucose levels will improve Outcome: Progressing   Problem: Nutritional: Goal: Maintenance of adequate nutrition will improve Outcome: Progressing Goal: Progress toward achieving an optimal weight will improve Outcome: Progressing   Problem: Skin Integrity: Goal: Risk for impaired skin integrity will decrease Outcome: Progressing   Problem: Education: Goal: Knowledge of General Education information will improve Description: Including pain rating scale, medication(s)/side effects and non-pharmacologic comfort measures Outcome: Progressing   Problem: Safety: Goal: Ability to remain free from injury will improve Outcome: Progressing

## 2023-04-10 NOTE — TOC Transition Note (Addendum)
 Transition of Care [facility]) - Discharge Note   Patient Details  Name: Nicholas Weiss MRN: [id number] Date of Birth: Jul 11, 1950  Transition of Care [facility]) CM/SW Contact:  Heron KATHEE Edison, RN Phone Number: 04/10/2023, 10:57 AM   Clinical Narrative: 04/10/23: Patient has discharge orders to home/w home health via [facility] for PT/OT. No DME ordered this admission. DC today pending safe transportation due to winter weather road conditions.   Please contact TOC RN CM today at [phone] if further/additional needs arise prior to discharge. Thank you.   Update: No safe transportation on discharge due to weather/road/snow conditions and sig. Other unable to get to hospital.   Bing Edison MSN RN CM  Care Management Department.  [place]  [facility] Direct Dial: [phone] (Weekends Only) [facility] Main Office Phone: [phone] [facility] Fax: [phone]       Final next level of care: Home w Home Health Services Barriers to Discharge: Barriers Resolved (Pending safe transportation due to weather/road conditions today in the area.)   Patient Goals and CMS Choice            Discharge Placement                       Discharge Plan and Services Additional resources added to the After Visit Summary for                  DME Arranged: N/A         HH Arranged: PT, OT HH Agency: [facility] Date [facility] Agency Contacted: 04/09/23      Social Drivers of Health (SDOH) Interventions SDOH Screenings   Food Insecurity: Patient Declined (04/06/2023)  Housing: Patient Unable To Answer (03/27/2023)  Transportation Needs: Patient Unable To Answer (03/27/2023)  Utilities: Patient Unable To Answer (03/27/2023)  Alcohol Screen: Low Risk  (10/02/2020)  Depression (PHQ2-9): Medium Risk (10/02/2020)  Financial Resource Strain: Low Risk  (01/26/2023)   Received from [facility]  Physical Activity: Insufficiently Active (01/01/2020)  Social Connections: Moderately  Isolated (03/30/2023)  Stress: No Stress Concern Present (01/01/2020)  Tobacco Use: Low Risk  (04/05/2023)  Recent Concern: Tobacco Use - Medium Risk (02/15/2023)   [facility] from [facility]     Readmission Risk Interventions    02/16/2023    3:56 PM 08/24/2022   10:57 AM 05/20/2022   11:41 AM  Readmission Risk Prevention Plan  Transportation Screening Complete Complete Complete  Medication Review Oceanographer) Complete Complete Complete  PCP or Specialist appointment within 3-5 days of discharge Complete Complete Complete  HRI or Home Care Consult Complete Complete Complete  SW Recovery Care/Counseling Consult Not Complete    Palliative Care Screening Not Applicable  Complete  Skilled Nursing Facility Complete Complete Complete

## 2023-04-10 NOTE — Progress Notes (Signed)
 PROGRESS NOTE    Nicholas Weiss  FMW:[id number] DOB: [birth year] DOA: 04/05/2023 PCP: Alla Amis, MD    Brief Narrative:   73 year old man past medical history of alcohol abuse, cocaine abuse, hypertension, prior CVA, type 1 diabetes mellitus, multiple myeloma, chronic kidney disease, paroxysmal atrial fibrillation on Eliquis  presents with altered mental status.  Patient was bradycardic, hypotensive on presentation and also hypothermic and altered mental status.  Patient had a very low sugar.   1/7.  Patient agreeable to blood transfusion and hemoglobin of 6.9.  Patient sugar now above 400 starting on Semglee  insulin  sliding scale and NovoLog  prior to meals.  Patient very labile with his sugars.  1/10: Patient suffered fall, mechanical this a.m.  No evidence of head trauma.  Immediate response from nursing and patient care tech team   Assessment & Plan:   Principal Problem:   Uncontrolled type 1 diabetes mellitus with hypoglycemia, with long-term current use of insulin  (HCC) Active Problems:   Acute metabolic encephalopathy   Hypotension   Hypothermia   Seizure disorder (HCC)   CKD (chronic kidney disease) stage 3, GFR 30-59 ml/min (HCC)   Paroxysmal atrial fibrillation (HCC)   Iron  deficiency anemia   Uncontrolled type 1 diabetes mellitus with hypoglycemia, with long-term current use of insulin  (HCC) Remains labile.  Recurrent episodes of hypoglycemia  Plan: DC nightly coverage Very sensitive sliding scale Semglee  2 units daily NovoLog  2 units 3 times daily with meals Medically stable for discharge  Diabetes educator saw patient.  Outpatient endocrine referral Will discharge with prescription for Dexcom     Seizure disorder (HCC) Continue PO keppra     CKD (chronic kidney disease) stage 3, GFR 30-59 ml/min (HCC) CKD stage IIIa.  Creatinine 1.48 with a GFR 51.  Stable.    Hyponatremia  Mild CTM    Acute metabolic encephalopathy Resolved  Likely  secondary to low sugar.   Hypothermia Resolved Unclear if an infection.  Patient placed on empiric antibiotics.  Blood cultures negative. Discontinued antibiotics 1/8 AM.   Hypotension Resolved.  Normotensive. Continue to Hold hydralazine .   Paroxysmal atrial fibrillation (HCC) Eliquis  resumed this PM      Iron  deficiency anemia Likely component of anemia of CKD  Stable.      DVT prophylaxis: Apixaban  Code Status: Full Family Communication: None Disposition Plan: Status is: Inpatient Remains inpatient appropriate because: Unsafe discharge plan.  Patient unable to get home today due to lack of transportation services due to weather and significant other unable to get to the hospital   Level of care: Med-Surg  Consultants:  None  Procedures:  None  Antimicrobials: None   Subjective: Seen and examined.  More communicative this morning.  No distress.  Objective: Vitals:   04/09/23 1700 04/10/23 0420 04/10/23 0800 04/10/23 1212  BP: (!) 142/93 (!) 163/109 (!) 176/114 (!) 140/99  Pulse: 78 80 78 81  Resp: 16 18 16 16   Temp: 98 F (36.7 C) 98.2 F (36.8 C) 97.6 F (36.4 C) 98 F (36.7 C)  TempSrc:      SpO2: 100% 100% 100% 100%  Weight:      Height:        Intake/Output Summary (Last 24 hours) at 04/10/2023 1424 Last data filed at 04/10/2023 1000 Gross per 24 hour  Intake 594 ml  Output 1375 ml  Net -781 ml   Filed Weights   04/05/23 1906  Weight: 61.9 kg    Examination:  General exam: No acute distress Respiratory system: Clear to  auscultation. Respiratory effort normal. Cardiovascular system: S1-S2, RRR, no murmurs, no pedal edema Gastrointestinal system: Thin, soft, NT/ND, normal bowel sounds Central nervous system: Unable to assess orientation.  Alert and awake Extremities: Unable to assess Skin: No rashes, lesions or ulcers Psychiatry: Unable to assess    Data Reviewed: I have personally reviewed following labs and imaging  studies  CBC: Recent Labs  Lab 04/05/23 1854 04/06/23 0500 04/07/23 0509 04/08/23 0902 04/09/23 0501 04/10/23 1022  WBC 7.4 5.7 7.0 6.4 4.8 4.0  NEUTROABS 3.0  --   --   --   --  2.4  HGB 7.8* 6.9* 8.7* 8.8* 8.8* 9.9*  HCT 24.8* 21.6* 25.8* 26.6* 26.1* 28.1*  MCV 99.2 97.3 91.8 91.7 91.9 89.2  PLT 337 230 273 267 271 246   Basic Metabolic Panel: Recent Labs  Lab 04/05/23 1854 04/06/23 0500 04/07/23 0509 04/08/23 0902 04/09/23 0501 04/10/23 1022  NA 140 138 139 133* 132* 129*  K 2.9* 4.6 4.6 4.0 4.6 4.4  CL 107 108 109 102 100 97*  CO2 23 21* 21* 22 25 25   GLUCOSE 24* 224* 278* 99 166* 348*  BUN 40* 41* 37* 26* 24* 20  CREATININE 1.63* 1.48* 1.50* 1.25* 1.25* 1.24  CALCIUM  7.9* 7.5* 8.3* 8.2* 8.0* 7.9*  MG 2.0  --   --   --   --   --    GFR: Estimated Creatinine Clearance: 48.5 mL/min (by C-G formula based on SCr of 1.24 mg/dL). Liver Function Tests: Recent Labs  Lab 04/05/23 1854 04/06/23 0500  AST 48* 28  ALT 41 34  ALKPHOS 64 59  BILITOT 0.5 0.5  PROT 6.7 6.3*  ALBUMIN 2.6* 2.4*   Recent Labs  Lab 04/05/23 1854  LIPASE 25   Recent Labs  Lab 04/05/23 1854  AMMONIA  24   Coagulation Profile: Recent Labs  Lab 04/06/23 0500  INR 1.5*   Cardiac Enzymes: Recent Labs  Lab 04/06/23 0500  CKTOTAL 50   BNP (last 3 results) No results for input(s): PROBNP in the last 8760 hours. HbA1C: No results for input(s): HGBA1C in the last 72 hours. CBG: Recent Labs  Lab 04/09/23 1729 04/09/23 2217 04/10/23 0413 04/10/23 0802 04/10/23 1326  GLUCAP 153* 190* 268* 236* 229*   Lipid Profile: No results for input(s): CHOL, HDL, LDLCALC, TRIG, CHOLHDL, LDLDIRECT in the last 72 hours. Thyroid  Function Tests: No results for input(s): TSH, T4TOTAL, FREET4, T3FREE, THYROIDAB in the last 72 hours. Anemia Panel: No results for input(s): VITAMINB12, FOLATE, FERRITIN, TIBC, IRON , RETICCTPCT in the last 72 hours. Sepsis  Labs: Recent Labs  Lab 04/05/23 1854 04/05/23 2326 04/06/23 0500  LATICACIDVEN 3.4* 1.5 1.2    Recent Results (from the past 240 hours)  Blood culture (routine x 2)     Status: None   Collection Time: 04/05/23  7:35 PM   Specimen: BLOOD  Result Value Ref Range Status   Specimen Description BLOOD BLOOD RIGHT HAND  Final   Special Requests   Final    BOTTLES DRAWN AEROBIC AND ANAEROBIC Blood Culture results may not be optimal due to an inadequate volume of blood received in culture bottles   Culture   Final    NO GROWTH 5 DAYS Performed at [facility], [address] [place], [place] [address]    Report Status 04/10/2023 FINAL  Final  Blood culture (routine x 2)     Status: None   Collection Time: 04/05/23  7:45 PM   Specimen: BLOOD  Result Value Ref Range Status   Specimen Description BLOOD BLOOD LEFT HAND  Final   Special Requests   Final    BOTTLES DRAWN AEROBIC AND ANAEROBIC Blood Culture results may not be optimal due to an inadequate volume of blood received in culture bottles   Culture   Final    NO GROWTH 5 DAYS Performed at [facility], [address] [place], [place] [address]    Report Status 04/10/2023 FINAL  Final  MRSA Next Gen by PCR, Nasal     Status: None   Collection Time: 04/08/23  1:34 AM   Specimen: Nasal Mucosa; Nasal Swab  Result Value Ref Range Status   MRSA by PCR Next Gen NOT DETECTED NOT DETECTED Final    Comment: (NOTE) The GeneXpert MRSA Assay (FDA approved for NASAL specimens only), is one component of a comprehensive MRSA colonization surveillance program. It is not intended to diagnose MRSA infection nor to guide or monitor treatment for MRSA infections. Test performance is not FDA approved in patients less than 68 years old. Performed at [facility], [address], [place], [place] [address]          Radiology Studies: No results found.      Scheduled Meds:  apixaban   5 mg Oral BID    atorvastatin   40 mg Oral Daily   hydrALAZINE   25 mg Oral Q8H   insulin  aspart  0-6 Units Subcutaneous TID WC   insulin  aspart  2 Units Subcutaneous TID WC   insulin  glargine-yfgn  2 Units Subcutaneous Daily   levETIRAcetam   500 mg Oral BID   pantoprazole   40 mg Oral Daily   sodium chloride  flush  3 mL Intravenous Q12H   Continuous Infusions:   LOS: 4 days       Calvin KATHEE Robson, MD [facility]   If 7PM-7AM, please contact night-coverage  04/10/2023, 2:24 PM

## 2023-04-11 DIAGNOSIS — E10649 Type 1 diabetes mellitus with hypoglycemia without coma: Secondary | ICD-10-CM | POA: Diagnosis not present

## 2023-04-11 LAB — GLUCOSE, CAPILLARY: Glucose-Capillary: 91 mg/dL (ref 70–99)

## 2023-04-11 NOTE — Plan of Care (Signed)
  Problem: Fluid Volume: Goal: Hemodynamic stability will improve Outcome: Progressing   Problem: Clinical Measurements: Goal: Diagnostic test results will improve Outcome: Progressing   Problem: Clinical Measurements: Goal: Signs and symptoms of infection will decrease Outcome: Progressing   Problem: Respiratory: Goal: Ability to maintain adequate ventilation will improve Outcome: Progressing

## 2023-04-11 NOTE — Plan of Care (Signed)
  Problem: Fluid Volume: Goal: Hemodynamic stability will improve Outcome: Progressing   Problem: Clinical Measurements: Goal: Diagnostic test results will improve Outcome: Progressing Goal: Signs and symptoms of infection will decrease Outcome: Progressing   Problem: Respiratory: Goal: Ability to maintain adequate ventilation will improve Outcome: Progressing   Problem: Education: Goal: Ability to describe self-care measures that may prevent or decrease complications (Diabetes Survival Skills Education) will improve Outcome: Progressing Goal: Individualized Educational Video(s) Outcome: Progressing

## 2023-04-11 NOTE — TOC Transition Note (Addendum)
 Transition of Care [facility]) - Discharge Note   Patient Details  Name: Nicholas Weiss MRN: [id number] Date of Birth: 1950-12-04  Transition [facility]) CM/SW Contact:  Heron KATHEE Edison, RN Phone Number: 04/11/2023, 10:59 AM   Clinical Narrative: 04/11/23: Patient to discharge today if girlfriend can come transport to home or a taxi service will be arranged via Richelle Ee which RNCM confirmed will be in service today. GF requested to speak to provider before coming to hospital to transport patient home. Home/w home health via [facility] for PT/OT. No DME ordered this admission. Notified [facility] of pending discharge today.   Note: GF is transporting patient after speaking with provider via phone call.   Please contact TOC RN CM today at [phone] if further/additional needs arise prior to discharge. Thank you.   Bing Edison MSN RN CM  Care Management Department.  [place]  [facility] Direct Dial: [phone] (Weekends Only) [facility] Main Office Phone: [phone] [facility] Fax: [phone]       Final next level of care: Home w Home Health Services Barriers to Discharge: Transportation (Unsafe discharge plan d/t to no safe transportation to home due to mentioned weather/road conditions.)   Patient Goals and CMS Choice            Discharge Placement                       Discharge Plan and Services Additional resources added to the After Visit Summary for                  DME Arranged: N/A         HH Arranged: PT, OT HH Agency: [facility] Date [facility] Agency Contacted: 04/09/23      Social Drivers of Health (SDOH) Interventions SDOH Screenings   Food Insecurity: Patient Declined (04/06/2023)  Housing: Patient Unable To Answer (03/27/2023)  Transportation Needs: Patient Unable To Answer (03/27/2023)  Utilities: Patient Unable To Answer (03/27/2023)  Alcohol Screen: Low Risk  (10/02/2020)  Depression (PHQ2-9): Medium Risk (10/02/2020)   Financial Resource Strain: Low Risk  (01/26/2023)   Received from [facility]  Physical Activity: Insufficiently Active (01/01/2020)  Social Connections: Moderately Isolated (03/30/2023)  Stress: No Stress Concern Present (01/01/2020)  Tobacco Use: Low Risk  (04/05/2023)  Recent Concern: Tobacco Use - Medium Risk (02/15/2023)   Received from [facility]     Readmission Risk Interventions    02/16/2023    3:56 PM 08/24/2022   10:57 AM 05/20/2022   11:41 AM  Readmission Risk Prevention Plan  Transportation Screening Complete Complete Complete  Medication Review Oceanographer) Complete Complete Complete  PCP or Specialist appointment within 3-5 days of discharge Complete Complete Complete  HRI or Home Care Consult Complete Complete Complete  SW Recovery Care/Counseling Consult Not Complete    Palliative Care Screening Not Applicable  Complete  Skilled Nursing Facility Complete Complete Complete

## 2023-04-11 NOTE — Discharge Summary (Signed)
 Physician Discharge Summary  Nicholas Weiss FMW:[id number] DOB: [birth year] DOA: 04/05/2023  PCP: Alla Amis, MD  Admit date: 04/05/2023 Discharge date: 04/11/2023  Admitted From: Home Disposition:  Home w home health  Recommendations for Outpatient Follow-up:  Follow up with PCP in 1-2 weeks Ambulatory referral to endocrinology  Home Health: Yes PT OT Equipment/Devices: None  Discharge Condition: Stable CODE STATUS: Full Diet recommendation: Carb modified  Brief/Interim Summary:  73 year old man past medical history of alcohol abuse, cocaine abuse, hypertension, prior CVA, type 1 diabetes mellitus, multiple myeloma, chronic kidney disease, paroxysmal atrial fibrillation on Eliquis  presents with altered mental status.  Patient was bradycardic, hypotensive on presentation and also hypothermic and altered mental status.  Patient had a very low sugar.   1/7.  Patient agreeable to blood transfusion and hemoglobin of 6.9.  Patient sugar now above 400 starting on Semglee  insulin  sliding scale and NovoLog  prior to meals.  Patient very labile with his sugars.   1/10: Patient suffered fall, mechanical this a.m.  No evidence of head trauma.  Immediate response from nursing and patient care tech team    Discharge Diagnoses:  Principal Problem:   Uncontrolled type 1 diabetes mellitus with hypoglycemia, with long-term current use of insulin  (HCC) Active Problems:   Acute metabolic encephalopathy   Hypotension   Hypothermia   Seizure disorder (HCC)   CKD (chronic kidney disease) stage 3, GFR 30-59 ml/min (HCC)   Paroxysmal atrial fibrillation (HCC)   Iron  deficiency anemia Uncontrolled type 1 diabetes mellitus with hypoglycemia, with long-term current use of insulin  (HCC) Remains labile.  Recurrent episodes of hypoglycemia   Plan: Discharged home.  Prescription provided for Dexcom G7 sensor.  Lengthy discussion with patient's significant other on day of discharge.  Explained that  he is a highly labile type I diabetic and needs follow-up with endocrinology.  Patient's significant other states he was previously established with endocrinology however had been apparently lost to follow-up.  Has not seen his primary care physician in quite a while either.  Recommend close follow-up with outpatient providers and consideration for insulin  pump.  Discharge Instructions  Discharge Instructions     Ambulatory referral to Endocrinology   Complete by: As directed    Diet - low sodium heart healthy   Complete by: As directed    Diet - low sodium heart healthy   Complete by: As directed    Increase activity slowly   Complete by: As directed    Increase activity slowly   Complete by: As directed       Allergies as of 04/11/2023       Reactions   Penicillins Other (See Comments)   Childhood allergy -tolerated amoxil  03/2022 Unknown reaction Has patient had a PCN reaction causing immediate rash, facial/tongue/throat swelling, SOB or lightheadedness with hypotension: No Has patient had a PCN reaction causing severe rash involving mucus membranes or skin necrosis: No Has patient had a PCN reaction that required hospitalization No Has patient had a PCN reaction occurring within the last 10 years: No If all of the above answers are NO, then may proceed with Cephalosporin use.        Medication List     STOP taking these medications    insulin  glargine-yfgn 100 UNIT/ML injection Commonly known as: SEMGLEE        TAKE these medications    acetaminophen  500 MG tablet Commonly known as: TYLENOL  Take 1,000 mg by mouth daily as needed for mild pain or moderate pain.   apixaban   5 MG Tabs tablet Commonly known as: ELIQUIS  Take 1 tablet (5 mg total) by mouth 2 (two) times daily. Start Eliquis  after 2 weeks which will be 03/02/2023   ascorbic acid  500 MG tablet Commonly known as: VITAMIN C  Take 1 tablet (500 mg total) by mouth daily.   atorvastatin  80 MG  tablet Commonly known as: LIPITOR  Take 1 tablet (80 mg total) by mouth at bedtime. Hold while taking Paxlovid  What changed: Another medication with the same name was removed. Continue taking this medication, and follow the directions you see here.   bisacodyl  5 MG EC tablet Commonly known as: bisacodyl  Take 1 tablet (5 mg total) by mouth at bedtime. Skip the dose if no constipation   Dexcom G7 Sensor Misc 1 each by Does not apply route 4 (four) times daily -  before meals and at bedtime.   fludrocortisone  0.1 MG tablet Commonly known as: FLORINEF  Take 1 tablet (0.1 mg total) by mouth daily. Skip the dose if SBP >130   gabapentin  300 MG capsule Commonly known as: NEURONTIN  Take 300 mg by mouth 2 (two) times daily.   hydrALAZINE  25 MG tablet Commonly known as: APRESOLINE  Take 1 tablet (25 mg total) by mouth 3 (three) times daily as needed (SBP >160).   iron  polysaccharides 150 MG capsule Commonly known as: NIFEREX Take 1 capsule (150 mg total) by mouth daily.   levETIRAcetam  500 MG tablet Commonly known as: KEPPRA  Take 1 tablet (500 mg total) by mouth 2 (two) times daily.   multivitamin with minerals Tabs tablet Take 1 tablet by mouth daily.   NovoLOG  100 UNIT/ML injection Generic drug: insulin  aspart Sliding Scale: Glucose 70 - 120: 0 units  Glucose 121 - 150: 1 unit  Glucose 151 - 200: 2 units Glucose 201 - 250: 3 units  Glucose 251 - 300: 5 units  Glucose 301 - 350: 7 units  Glucose 351 - 400: 9 units  Glucose > 400: call your docotr   Insulin  Aspart FlexPen 100 UNIT/ML Commonly known as: NOVOLOG  Sliding Scale: Glucose 70 - 120: 0 units Glucose 121 - 150: 1 unit Glucose 151 - 200: 2 units Glucose 201 - 250: 3 units Glucose 251 - 300: 5 units Glucose 301 - 350: 7 units Glucose 351 - 400: 9 units Glucose > 400: call your docotr   ondansetron  8 MG tablet Commonly known as: ZOFRAN  Take 8 mg by mouth daily as needed for nausea or vomiting.   polyethylene glycol 17 g  packet Commonly known as: MiraLax  Take 17 g by mouth 2 (two) times daily. Skip the dose if no constipation   traZODone  50 MG tablet Commonly known as: DESYREL  Take 0.5 tablets (25 mg total) by mouth at bedtime as needed for sleep.   Tresiba  FlexTouch 100 UNIT/ML FlexTouch Pen Generic drug: insulin  degludec Inject 5 Units into the skin daily. Home med. What changed: how much to take   Vitamin D  (Ergocalciferol ) 1.25 MG (50000 UNIT) Caps capsule Commonly known as: DRISDOL  Take 1 capsule (50,000 Units total) by mouth every 7 (seven) days.        Allergies  Allergen Reactions   Penicillins Other (See Comments)    Childhood allergy -tolerated amoxil  03/2022 Unknown reaction Has patient had a PCN reaction causing immediate rash, facial/tongue/throat swelling, SOB or lightheadedness with hypotension: No Has patient had a PCN reaction causing severe rash involving mucus membranes or skin necrosis: No Has patient had a PCN reaction that required hospitalization No Has patient had a  PCN reaction occurring within the last 10 years: No If all of the above answers are NO, then may proceed with Cephalosporin use.     Consultations: None   Procedures/Studies: EEG adult Result Date: 04/06/2023 Shelton Arlin KIDD, MD     04/06/2023 12:53 PM Patient Name: DETROIT FRIEDEN MRN: [id number] Epilepsy Attending: Arlin KIDD Shelton Referring Physician/Provider: Tobie Mario GAILS, MD Date: 04/06/2023 Duration: 24.52 mins Patient history: 73yo M with ams getting eeg to evaluate for seizure. Level of alertness: Awake, asleep AEDs during EEG study: LEV Technical aspects: This EEG study was done with scalp electrodes positioned according to the 10-20 International system of electrode placement. Electrical activity was reviewed with band pass filter of 1-70Hz , sensitivity of 7 uV/mm, display speed of 73mm/sec with a 60Hz  notched filter applied as appropriate. EEG data were recorded continuously and digitally stored.   Video monitoring was available and reviewed as appropriate. Description: The posterior dominant rhythm consists of 7.5 Hz activity of moderate voltage (25-35 uV) seen predominantly in posterior head regions, symmetric and reactive to eye opening and eye closing. Sleep was characterized by sleep spindles (12 to 14 Hz), maximal frontocentral region. EEG showed continuous generalized 5 to 7 Hz theta slowing admixed with intermittent generalized 2-3 Hz delta slowing, at times with triphasic morphology. Hyperventilation and photic stimulation were not performed.   ABNORMALITY - Continuous slow, generalized IMPRESSION: This study is suggestive of moderate diffuse encephalopathy. No seizures or epileptiform discharges were seen throughout the recording. Priyanka O Yadav   CT Head Wo Contrast Result Date: 04/05/2023 CLINICAL DATA:  Unresponsive EXAM: CT HEAD WITHOUT CONTRAST TECHNIQUE: Contiguous axial images were obtained from the base of the skull through the vertex without intravenous contrast. RADIATION DOSE REDUCTION: This exam was performed according to the departmental dose-optimization program which includes automated exposure control, adjustment of the mA and/or kV according to patient size and/or use of iterative reconstruction technique. COMPARISON:  03/26/2023 FINDINGS: Brain: No acute intracranial abnormality. Specifically, no hemorrhage, hydrocephalus, mass lesion, acute infarction, or significant intracranial injury. Mild chronic small vessel disease throughout the deep white matter. Extensive dural calcifications, stable. Vascular: No hyperdense vessel or unexpected calcification. Skull: No acute calvarial abnormality. Sinuses/Orbits: No acute findings Other: None IMPRESSION: No acute intracranial abnormality. Mild chronic small vessel disease. Electronically Signed   By: Franky Crease M.D.   On: 04/05/2023 19:24   DG Chest Portable 1 View Result Date: 04/05/2023 CLINICAL DATA:  Unresponsive EXAM:  PORTABLE CHEST 1 VIEW COMPARISON:  03/26/2023 FINDINGS: Heart and mediastinal contours are within normal limits. No focal opacities or effusions. No acute bony abnormality. Previously seen right lateral 8th and 9th rib fractures not well visualized. Aortic atherosclerosis. IMPRESSION: No active disease. Electronically Signed   By: Franky Crease M.D.   On: 04/05/2023 19:23   CT CHEST ABDOMEN PELVIS WO CONTRAST Result Date: 03/26/2023 CLINICAL DATA:  Sepsis.  Multiple myeloma. EXAM: CT CHEST, ABDOMEN AND PELVIS WITHOUT CONTRAST TECHNIQUE: Multidetector CT imaging of the chest, abdomen and pelvis was performed following the standard protocol without IV contrast. RADIATION DOSE REDUCTION: This exam was performed according to the departmental dose-optimization program which includes automated exposure control, adjustment of the mA and/or kV according to patient size and/or use of iterative reconstruction technique. COMPARISON:  Chest CT dated 02/15/2023. FINDINGS: Evaluation of this exam is limited in the absence of intravenous contrast. CT CHEST FINDINGS Cardiovascular: There is no cardiomegaly. Trace pericardial effusion. Advanced 3 vessel coronary vascular calcification. There is moderate atherosclerotic calcification of  the thoracic aorta. No aneurysmal dilatation. The central pulmonary arteries are grossly unremarkable. Mediastinum/Nodes: No hilar or mediastinal adenopathy. The esophagus and the thyroid  gland are grossly unremarkable. No mediastinal fluid collection. Lungs/Pleura: Left lung base linear atelectasis/scarring. Small left lower lobe subpleural calcified granuloma. No focal consolidation, pleural effusion, or pneumothorax. The central airways are patent. Musculoskeletal: Osteopenia with degenerative changes of the spine. Fractures of the lateral right 8th-10th ribs as seen on the prior CT. No new fractures. CT ABDOMEN PELVIS FINDINGS No intra-abdominal free air or free fluid. Hepatobiliary:  Indeterminate 15 mm hypodense lesion in the right lobe of the liver, present on the CT of 2023, possibly a cyst or hemangioma. No biliary dilatation. The gallbladder is unremarkable as visualized. Pancreas: Unremarkable. No pancreatic ductal dilatation or surrounding inflammatory changes. Spleen: Normal in size without focal abnormality. Adrenals/Urinary Tract: The adrenal glands unremarkable. There is no hydronephrosis or nephrolithiasis on either side. The visualized ureters and urinary bladder appear unremarkable. Stomach/Bowel: There is moderate stool throughout the colon. There is no bowel obstruction or active inflammation. The appendix is normal. Vascular/Lymphatic: Advanced aortoiliac atherosclerotic disease. The IVC is unremarkable. No portal venous gas. There is no adenopathy. Reproductive: The prostate and seminal vesicles are grossly unremarkable. No pelvic mass. Other: None Musculoskeletal: Osteopenia with degenerative changes of the spine. There is mild compression fracture of superior endplate of L1, present on the prior CT. Correlation with clinical exam and point tenderness recommended. No new fracture. IMPRESSION: 1. No acute intrathoracic, abdominal, or pelvic pathology. 2. No bowel obstruction. Normal appendix. 3. Mild compression fracture of superior endplate of L1, present on the prior CT. Correlation with clinical exam and point tenderness recommended. 4.  Aortic Atherosclerosis (ICD10-I70.0). Electronically Signed   By: Vanetta Chou M.D.   On: 03/26/2023 16:05   DG Chest 2 View Result Date: 03/26/2023 CLINICAL DATA:  Chest pain.  Altered mental status and falls EXAM: CHEST - 2 VIEW COMPARISON:  Chest radiograph dated 03/09/2023 FINDINGS: Normal lung volumes. No focal consolidations. No pleural effusion or pneumothorax. The heart size and mediastinal contours are within normal limits. Minimally displaced right lateral eighth and ninth rib fractures. IMPRESSION: Minimally displaced  right lateral eighth and ninth rib fractures. No pneumothorax. Electronically Signed   By: Limin  Xu M.D.   On: 03/26/2023 13:46   CT Head Wo Contrast Result Date: 03/26/2023 CLINICAL DATA:  Head trauma, minor (Age >= 65y); Neck trauma (Age >= 65y) EXAM: CT HEAD WITHOUT CONTRAST CT CERVICAL SPINE WITHOUT CONTRAST TECHNIQUE: Multidetector CT imaging of the head and cervical spine was performed following the standard protocol without intravenous contrast. Multiplanar CT image reconstructions of the cervical spine were also generated. RADIATION DOSE REDUCTION: This exam was performed according to the departmental dose-optimization program which includes automated exposure control, adjustment of the mA and/or kV according to patient size and/or use of iterative reconstruction technique. COMPARISON:  CT head 02/20/23 FINDINGS: CT HEAD FINDINGS Brain: No evidence of acute infarction, hemorrhage, hydrocephalus, extra-axial collection or mass lesion/mass effect. Unchanged calcific density adjacent to the left cavernous sinus and along the falx. Vascular: No hyperdense vessel or unexpected calcification. Skull: Normal. Negative for fracture or focal lesion. Sinuses/Orbits: Middle ear or mastoid effusion. Paranasal sinuses clear. Orbits are unremarkable. Other: None. CT CERVICAL SPINE FINDINGS Alignment: Normal. Skull base and vertebrae: No acute fracture. No primary bone lesion or focal pathologic process. Soft tissues and spinal canal: No prevertebral fluid or swelling. No visible canal hematoma. Disc levels:  No CT evidence of  high-grade spinal canal stenosis Upper chest: Negative. Other: None IMPRESSION: 1. No acute intracranial abnormality. 2. No acute fracture or traumatic subluxation of the cervical spine. Electronically Signed   By: Lyndall Gore M.D.   On: 03/26/2023 13:19   CT Cervical Spine Wo Contrast Result Date: 03/26/2023 CLINICAL DATA:  Head trauma, minor (Age >= 65y); Neck trauma (Age >= 65y) EXAM: CT  HEAD WITHOUT CONTRAST CT CERVICAL SPINE WITHOUT CONTRAST TECHNIQUE: Multidetector CT imaging of the head and cervical spine was performed following the standard protocol without intravenous contrast. Multiplanar CT image reconstructions of the cervical spine were also generated. RADIATION DOSE REDUCTION: This exam was performed according to the departmental dose-optimization program which includes automated exposure control, adjustment of the mA and/or kV according to patient size and/or use of iterative reconstruction technique. COMPARISON:  CT head 02/20/23 FINDINGS: CT HEAD FINDINGS Brain: No evidence of acute infarction, hemorrhage, hydrocephalus, extra-axial collection or mass lesion/mass effect. Unchanged calcific density adjacent to the left cavernous sinus and along the falx. Vascular: No hyperdense vessel or unexpected calcification. Skull: Normal. Negative for fracture or focal lesion. Sinuses/Orbits: Middle ear or mastoid effusion. Paranasal sinuses clear. Orbits are unremarkable. Other: None. CT CERVICAL SPINE FINDINGS Alignment: Normal. Skull base and vertebrae: No acute fracture. No primary bone lesion or focal pathologic process. Soft tissues and spinal canal: No prevertebral fluid or swelling. No visible canal hematoma. Disc levels:  No CT evidence of high-grade spinal canal stenosis Upper chest: Negative. Other: None IMPRESSION: 1. No acute intracranial abnormality. 2. No acute fracture or traumatic subluxation of the cervical spine. Electronically Signed   By: Lyndall Gore M.D.   On: 03/26/2023 13:19      Subjective: Seen and examined on the day of discharge.  Stable no distress.  Appropriate for discharge home.  Discharge Exam: Vitals:   04/11/23 0438 04/11/23 0807  BP: 121/70 121/87  Pulse: 66 (!) 58  Resp: 20 19  Temp: 98.1 F (36.7 C) 98 F (36.7 C)  SpO2: 95% 100%   Vitals:   04/10/23 2357 04/11/23 0438 04/11/23 0439 04/11/23 0807  BP: 127/79 121/70  121/87  Pulse: 66 66   (!) 58  Resp: 20 20  19   Temp: 98.7 F (37.1 C) 98.1 F (36.7 C)  98 F (36.7 C)  TempSrc: Oral   Oral  SpO2: 100% 95%  100%  Weight:   62.4 kg   Height:        General: Pt is alert, awake, not in acute distress Cardiovascular: RRR, S1/S2 +, no rubs, no gallops Respiratory: CTA bilaterally, no wheezing, no rhonchi Abdominal: Soft, NT, ND, bowel sounds + Extremities: no edema, no cyanosis    The results of significant diagnostics from this hospitalization (including imaging, microbiology, ancillary and laboratory) are listed below for reference.     Microbiology: Recent Results (from the past 240 hours)  Blood culture (routine x 2)     Status: None   Collection Time: 04/05/23  7:35 PM   Specimen: BLOOD  Result Value Ref Range Status   Specimen Description BLOOD BLOOD RIGHT HAND  Final   Special Requests   Final    BOTTLES DRAWN AEROBIC AND ANAEROBIC Blood Culture results may not be optimal due to an inadequate volume of blood received in culture bottles   Culture   Final    NO GROWTH 5 DAYS Performed at [facility], [address], [place], [place] [address]    Report Status 04/10/2023 FINAL  Final  Blood culture (routine x 2)     Status: None   Collection Time: 04/05/23  7:45 PM   Specimen: BLOOD  Result Value Ref Range Status   Specimen Description BLOOD BLOOD LEFT HAND  Final   Special Requests   Final    BOTTLES DRAWN AEROBIC AND ANAEROBIC Blood Culture results may not be optimal due to an inadequate volume of blood received in culture bottles   Culture   Final    NO GROWTH 5 DAYS Performed at [facility], [address] [place], [place] [address]    Report Status 04/10/2023 FINAL  Final  MRSA Next Gen by PCR, Nasal     Status: None   Collection Time: 04/08/23  1:34 AM   Specimen: Nasal Mucosa; Nasal Swab  Result Value Ref Range Status   MRSA by PCR Next Gen NOT DETECTED NOT DETECTED Final    Comment: (NOTE) The GeneXpert MRSA Assay  (FDA approved for NASAL specimens only), is one component of a comprehensive MRSA colonization surveillance program. It is not intended to diagnose MRSA infection nor to guide or monitor treatment for MRSA infections. Test performance is not FDA approved in patients less than 100 years old. Performed at [facility], [address] [place], [place] [address]      Labs: BNP (last 3 results) No results for input(s): BNP in the last 8760 hours. Basic Metabolic Panel: Recent Labs  Lab 04/05/23 1854 04/06/23 0500 04/07/23 0509 04/08/23 0902 04/09/23 0501 04/10/23 1022  NA 140 138 139 133* 132* 129*  K 2.9* 4.6 4.6 4.0 4.6 4.4  CL 107 108 109 102 100 97*  CO2 23 21* 21* 22 25 25   GLUCOSE 24* 224* 278* 99 166* 348*  BUN 40* 41* 37* 26* 24* 20  CREATININE 1.63* 1.48* 1.50* 1.25* 1.25* 1.24  CALCIUM  7.9* 7.5* 8.3* 8.2* 8.0* 7.9*  MG 2.0  --   --   --   --   --    Liver Function Tests: Recent Labs  Lab 04/05/23 1854 04/06/23 0500  AST 48* 28  ALT 41 34  ALKPHOS 64 59  BILITOT 0.5 0.5  PROT 6.7 6.3*  ALBUMIN 2.6* 2.4*   Recent Labs  Lab 04/05/23 1854  LIPASE 25   Recent Labs  Lab 04/05/23 1854  AMMONIA  24   CBC: Recent Labs  Lab 04/05/23 1854 04/06/23 0500 04/07/23 0509 04/08/23 0902 04/09/23 0501 04/10/23 1022  WBC 7.4 5.7 7.0 6.4 4.8 4.0  NEUTROABS 3.0  --   --   --   --  2.4  HGB 7.8* 6.9* 8.7* 8.8* 8.8* 9.9*  HCT 24.8* 21.6* 25.8* 26.6* 26.1* 28.1*  MCV 99.2 97.3 91.8 91.7 91.9 89.2  PLT 337 230 273 267 271 246   Cardiac Enzymes: Recent Labs  Lab 04/06/23 0500  CKTOTAL 50   BNP: Invalid input(s): POCBNP CBG: Recent Labs  Lab 04/10/23 0802 04/10/23 1326 04/10/23 1608 04/10/23 2048 04/11/23 0803  GLUCAP 236* 229* 79 132* 91   D-Dimer No results for input(s): DDIMER in the last 72 hours. Hgb A1c No results for input(s): HGBA1C in the last 72 hours. Lipid Profile No results for input(s): CHOL, HDL, LDLCALC,  TRIG, CHOLHDL, LDLDIRECT in the last 72 hours. Thyroid  function studies No results for input(s): TSH, T4TOTAL, T3FREE, THYROIDAB in the last 72 hours.  Invalid input(s): FREET3 Anemia work up No results for input(s): VITAMINB12, FOLATE, FERRITIN, TIBC, IRON , RETICCTPCT in the last 72 hours. Urinalysis  Component Value Date/Time   COLORURINE YELLOW (A) 04/05/2023 1933   APPEARANCEUR CLEAR (A) 04/05/2023 1933   LABSPEC 1.018 04/05/2023 1933   PHURINE 5.0 04/05/2023 1933   GLUCOSEU >=500 (A) 04/05/2023 1933   HGBUR NEGATIVE 04/05/2023 1933   BILIRUBINUR NEGATIVE 04/05/2023 1933   KETONESUR 5 (A) 04/05/2023 1933   PROTEINUR NEGATIVE 04/05/2023 1933   NITRITE NEGATIVE 04/05/2023 1933   LEUKOCYTESUR NEGATIVE 04/05/2023 1933   Sepsis Labs Recent Labs  Lab 04/07/23 0509 04/08/23 0902 04/09/23 0501 04/10/23 1022  WBC 7.0 6.4 4.8 4.0   Microbiology Recent Results (from the past 240 hours)  Blood culture (routine x 2)     Status: None   Collection Time: 04/05/23  7:35 PM   Specimen: BLOOD  Result Value Ref Range Status   Specimen Description BLOOD BLOOD RIGHT HAND  Final   Special Requests   Final    BOTTLES DRAWN AEROBIC AND ANAEROBIC Blood Culture results may not be optimal due to an inadequate volume of blood received in culture bottles   Culture   Final    NO GROWTH 5 DAYS Performed at [facility], [address] [place], [place] [address]    Report Status 04/10/2023 FINAL  Final  Blood culture (routine x 2)     Status: None   Collection Time: 04/05/23  7:45 PM   Specimen: BLOOD  Result Value Ref Range Status   Specimen Description BLOOD BLOOD LEFT HAND  Final   Special Requests   Final    BOTTLES DRAWN AEROBIC AND ANAEROBIC Blood Culture results may not be optimal due to an inadequate volume of blood received in culture bottles   Culture   Final    NO GROWTH 5 DAYS Performed at [facility], [address],  [place], [place] [address]    Report Status 04/10/2023 FINAL  Final  MRSA Next Gen by PCR, Nasal     Status: None   Collection Time: 04/08/23  1:34 AM   Specimen: Nasal Mucosa; Nasal Swab  Result Value Ref Range Status   MRSA by PCR Next Gen NOT DETECTED NOT DETECTED Final    Comment: (NOTE) The GeneXpert MRSA Assay (FDA approved for NASAL specimens only), is one component of a comprehensive MRSA colonization surveillance program. It is not intended to diagnose MRSA infection nor to guide or monitor treatment for MRSA infections. Test performance is not FDA approved in patients less than 11 years old. Performed at [facility], [address], [place], [place] [address]      Time coordinating discharge: Over 30 minutes  SIGNED:   Calvin KATHEE Robson, MD  [facility] 04/11/2023, 2:55 PM Pager   If 7PM-7AM, please contact night-coverage

## 2023-04-13 ENCOUNTER — Encounter: Payer: Self-pay | Admitting: Oncology

## 2023-04-13 ENCOUNTER — Other Ambulatory Visit: Payer: Self-pay | Admitting: Family Medicine

## 2023-04-13 ENCOUNTER — Ambulatory Visit
Admission: RE | Admit: 2023-04-13 | Discharge: 2023-04-13 | Disposition: A | Discharge status: Home / Self Care | Payer: 59 | Source: Ambulatory Visit | Attending: Family Medicine | Admitting: Family Medicine

## 2023-04-13 DIAGNOSIS — Z09 Encounter for follow-up examination after completed treatment for conditions other than malignant neoplasm: Secondary | ICD-10-CM | POA: Diagnosis present

## 2023-04-13 DIAGNOSIS — S0990XA Unspecified injury of head, initial encounter: Secondary | ICD-10-CM | POA: Insufficient documentation

## 2023-04-14 ENCOUNTER — Emergency Department: Payer: 59

## 2023-04-14 ENCOUNTER — Emergency Department
Admission: EM | Admit: 2023-04-14 | Discharge: 2023-04-14 | Disposition: A | Discharge status: Home / Self Care | Payer: 59 | Attending: Emergency Medicine | Admitting: Emergency Medicine

## 2023-04-14 ENCOUNTER — Other Ambulatory Visit: Payer: Self-pay

## 2023-04-14 DIAGNOSIS — R55 Syncope and collapse: Secondary | ICD-10-CM | POA: Diagnosis not present

## 2023-04-14 DIAGNOSIS — W19XXXA Unspecified fall, initial encounter: Secondary | ICD-10-CM | POA: Insufficient documentation

## 2023-04-14 DIAGNOSIS — Z7901 Long term (current) use of anticoagulants: Secondary | ICD-10-CM | POA: Insufficient documentation

## 2023-04-14 DIAGNOSIS — I10 Essential (primary) hypertension: Secondary | ICD-10-CM | POA: Insufficient documentation

## 2023-04-14 DIAGNOSIS — E162 Hypoglycemia, unspecified: Secondary | ICD-10-CM

## 2023-04-14 DIAGNOSIS — E10649 Type 1 diabetes mellitus with hypoglycemia without coma: Secondary | ICD-10-CM | POA: Diagnosis present

## 2023-04-14 LAB — COMPREHENSIVE METABOLIC PANEL
ALT: 60 U/L — ABNORMAL HIGH (ref 0–44)
AST: 118 U/L — ABNORMAL HIGH (ref 15–41)
Albumin: 3.2 g/dL — ABNORMAL LOW (ref 3.5–5.0)
Alkaline Phosphatase: 83 U/L (ref 38–126)
Anion gap: 9 (ref 5–15)
BUN: 38 mg/dL — ABNORMAL HIGH (ref 8–23)
CO2: 28 mmol/L (ref 22–32)
Calcium: 8.7 mg/dL — ABNORMAL LOW (ref 8.9–10.3)
Chloride: 96 mmol/L — ABNORMAL LOW (ref 98–111)
Creatinine, Ser: 1.83 mg/dL — ABNORMAL HIGH (ref 0.61–1.24)
GFR, Estimated: 39 mL/min — ABNORMAL LOW (ref >60)
Glucose, Bld: 78 mg/dL (ref 70–99)
Potassium: 4.6 mmol/L (ref 3.5–5.1)
Sodium: 133 mmol/L — ABNORMAL LOW (ref 135–145)
Total Bilirubin: 0.5 mg/dL (ref 0.0–1.2)
Total Protein: 8.7 g/dL — ABNORMAL HIGH (ref 6.5–8.1)

## 2023-04-14 LAB — URINALYSIS, ROUTINE W REFLEX MICROSCOPIC
Bilirubin Urine: NEGATIVE
Glucose, UA: 150 mg/dL — AB
Hgb urine dipstick: NEGATIVE
Ketones, ur: NEGATIVE mg/dL
Leukocytes,Ua: NEGATIVE
Nitrite: NEGATIVE
Protein, ur: 30 mg/dL — AB
Specific Gravity, Urine: 1.021 (ref 1.005–1.030)
pH: 5 (ref 5.0–8.0)

## 2023-04-14 LAB — CBC WITH DIFFERENTIAL/PLATELET
Abs Immature Granulocytes: 0.01 10*3/uL (ref 0.00–0.07)
Basophils Absolute: 0 10*3/uL (ref 0.0–0.1)
Basophils Relative: 1 %
Eosinophils Absolute: 0.1 10*3/uL (ref 0.0–0.5)
Eosinophils Relative: 3 %
HCT: 27.9 % — ABNORMAL LOW (ref 39.0–52.0)
Hemoglobin: 9.2 g/dL — ABNORMAL LOW (ref 13.0–17.0)
Immature Granulocytes: 0 %
Lymphocytes Relative: 35 %
Lymphs Abs: 1.4 10*3/uL (ref 0.7–4.0)
MCH: 32.1 pg (ref 26.0–34.0)
MCHC: 33 g/dL (ref 30.0–36.0)
MCV: 97.2 fL (ref 80.0–100.0)
Monocytes Absolute: 0.5 10*3/uL (ref 0.1–1.0)
Monocytes Relative: 13 %
Neutro Abs: 1.9 10*3/uL (ref 1.7–7.7)
Neutrophils Relative %: 48 %
Platelets: 208 10*3/uL (ref 150–400)
RBC: 2.87 MIL/uL — ABNORMAL LOW (ref 4.22–5.81)
RDW: 15.4 % (ref 11.5–15.5)
WBC: 3.9 10*3/uL — ABNORMAL LOW (ref 4.0–10.5)
nRBC: 0.8 % — ABNORMAL HIGH (ref 0.0–0.2)

## 2023-04-14 LAB — CBG MONITORING, ED
Glucose-Capillary: 66 mg/dL — ABNORMAL LOW (ref 70–99)
Glucose-Capillary: 104 mg/dL — ABNORMAL HIGH (ref 70–99)

## 2023-04-14 MED ORDER — SODIUM CHLORIDE 0.9 % IV BOLUS
500.0000 mL | Freq: Once | INTRAVENOUS | Status: AC
  Administered 2023-04-14: 500 mL via INTRAVENOUS

## 2023-04-14 NOTE — ED Triage Notes (Signed)
 Pt to ED via [facility] from home for c/o hypoglycemia and hypotension. Family states pt had syncopal episode and fell. Pt denies pain. Per family, cbg initially in 40s this morning, pt given orange juice. On EMS arrival, cbg 74 and BP 84/50. Pt has hx dementia, answering questions appropriately at this time.

## 2023-04-14 NOTE — Discharge Instructions (Signed)
 You were seen in the ER today for your low blood sugar and passing out.  I did recommend getting admitted to the hospital to further monitor, but she did wish to be discharged.  Please contact your endocrinologist to discuss your insulin  regimen and follow-up closely with your primary care doctor.  Return to the ER for any new or worsening symptoms.

## 2023-04-14 NOTE — ED Notes (Signed)
Pt given orange juice per request.

## 2023-04-14 NOTE — ED Provider Notes (Signed)
 [facility] Provider Note    Event Date/Time   First MD Initiated Contact with Patient 04/14/23 1504     (approximate)   History   Hypoglycemia, Hypotension, and Loss of Consciousness   HPI  Nicholas Weiss is a 73 year old male with history of T1DM, alcohol and cocaine use, HTN, CVA, A-fib on Eliquis  presenting to the emergency department for evaluation after a fall.  Per EMS, patient had a witnessed second pole episode and fall.  Per report, CBG was initially in the 40s this morning and patient was given orange juice.  On EMS arrival CBG was 74 with blood pressure of 84/50.  Did receive small fluid bolus with EMS.  Patient tells me that he has had decreased appetite with some decreased p.o. recently, but has been taking his medications appropriately.  He denies any injuries or complaints currently.  Unsure if he had any symptoms preceding his syncopal episode.  Specifically does not recall any chest pain or shortness of breath.   Additional history obtained from patient's significant other, Nicholas Weiss, via phone.  She reports that when they checked the patient's blood sugar this morning, it was low, but then increased to the 300s, so she gave appropriate sliding scale insulin , but then he did have recurrent hypoglycemia and a fall at which time EMS was called.  I did review patient's records, I do see that he saw endocrinology yesterday who did recommend insulin  changes including if he is having poor p.o. intake.   Physical Exam   Triage Vital Signs: ED Triage Vitals  Encounter Vitals Group     BP 04/14/23 1441 (!) 108/93     Systolic BP Percentile --      Diastolic BP Percentile --      Pulse Rate 04/14/23 1441 74     Resp 04/14/23 1441 18     Temp 04/14/23 1441 97.8 F (36.6 C)     Temp src --      SpO2 04/14/23 1441 100 %     Weight --      Height --      Head Circumference --      Peak Flow --      Pain Score 04/14/23 1445 0     Pain Loc --       Pain Education --      Exclude from Growth Chart --     Most recent vital signs: Vitals:   04/14/23 1500 04/14/23 1600  BP: 117/67 131/82  Pulse: 72 71  Resp: 18 16  Temp:    SpO2: 100% 100%     General: Awake, interactive  CV:  Regular rate, good peripheral perfusion.  Resp:  Unlabored respirations, lungs clear to auscultation Abd:  Nondistended,  Soft, nontender to palpation Neuro:  Symmetric facial movement, fluid speech, moving all extremities spontaneously and equally   ED Results / Procedures / Treatments   Labs (all labs ordered are listed, but only abnormal results are displayed) Labs Reviewed  CBC WITH DIFFERENTIAL/PLATELET - Abnormal; Notable for the following components:      Result Value   WBC 3.9 (*)    RBC 2.87 (*)    Hemoglobin 9.2 (*)    HCT 27.9 (*)    nRBC 0.8 (*)    All other components within normal limits  URINALYSIS, ROUTINE W REFLEX MICROSCOPIC - Abnormal; Notable for the following components:   Color, Urine YELLOW (*)    APPearance HAZY (*)    Glucose,  UA 150 (*)    Protein, ur 30 (*)    Bacteria, UA RARE (*)    All other components within normal limits  COMPREHENSIVE METABOLIC PANEL - Abnormal; Notable for the following components:   Sodium 133 (*)    Chloride 96 (*)    BUN 38 (*)    Creatinine, Ser 1.83 (*)    Calcium  8.7 (*)    Total Protein 8.7 (*)    Albumin 3.2 (*)    AST 118 (*)    ALT 60 (*)    GFR, Estimated 39 (*)    All other components within normal limits  CBG MONITORING, ED - Abnormal; Notable for the following components:   Glucose-Capillary 66 (*)    All other components within normal limits  CBG MONITORING, ED - Abnormal; Notable for the following components:   Glucose-Capillary 104 (*)    All other components within normal limits     EKG EKG independently reviewed interpreted by myself (ER attending) demonstrates:  EKG demonstrates sinus rhythm rate of 72, PR 218, QRS 114, QTc 593, history of prolonged  QTc  RADIOLOGY Imaging independently reviewed and interpreted by myself demonstrates:  CT head without acute bleed CT C-spine without acute fracture  PROCEDURES:  Critical Care performed: No  Procedures   MEDICATIONS ORDERED IN ED: Medications  sodium chloride  0.9 % bolus 500 mL (500 mLs Intravenous New Bag/Given 04/14/23 1559)     IMPRESSION / MDM / ASSESSMENT AND PLAN / ED COURSE  I reviewed the triage vital signs and the nursing notes.  Differential diagnosis includes, but is not limited to, fall secondary to hypoglycemia, dehydration, electrolyte abnormality, anemia, arrhythmia  Patient's presentation is most consistent with acute presentation with potential threat to life or bodily function.  73 year old male presenting to the emergency department for evaluation following a fall with hypoglycemia.  Mild hypoglycemia on presentation here, given p.o. intake with improvement.  Labs here with stable anemia, do demonstrate AKI with creatinine of 1.3, urine without evidence of infection.  EKG does demonstrate prolonged QTc compared to prior.  In obtaining further history from patient significant other, it does sound that he is very sensitive to his insulin  regimen with hypoglycemia when using appropriate correction.  I did discuss admission for further evaluation with his multiple episodes of hypoglycemia as well as prolonged QTc and concern for syncopal episode today.  Patient was adamantly opposed to this.  I did speak with his significant other, Nicholas Weiss over the phone who reports that the patient does make his own medical decisions.  I discussed the risk of recurrent hypoglycemia, syncopal episodes, possible death.  Patient expressed understanding and demonstrates decision-making capacity.  He was given an open invitation to return.  Patient was discharged against medical advice.  I did strongly encourage him to contact his endocrinologist to discuss further changes to his insulin  regimen.   Strict return precautions were provided.      FINAL CLINICAL IMPRESSION(S) / ED DIAGNOSES   Final diagnoses:  Hypoglycemia  Syncope and collapse     Rx / DC Orders   ED Discharge Orders     None        Note:  This document was prepared using Dragon voice recognition software and may include unintentional dictation errors.   Claria Crofts, MD 04/14/23 [phone]

## 2023-04-15 ENCOUNTER — Other Ambulatory Visit: Payer: Self-pay

## 2023-04-15 DIAGNOSIS — C9000 Multiple myeloma not having achieved remission: Secondary | ICD-10-CM

## 2023-04-15 DIAGNOSIS — N183 Chronic kidney disease, stage 3 unspecified: Secondary | ICD-10-CM

## 2023-04-15 DIAGNOSIS — D631 Anemia in chronic kidney disease: Secondary | ICD-10-CM

## 2023-04-16 ENCOUNTER — Other Ambulatory Visit: Payer: Self-pay

## 2023-04-26 ENCOUNTER — Encounter: Payer: Self-pay | Admitting: Oncology

## 2023-04-26 ENCOUNTER — Inpatient Hospital Stay: Payer: 59

## 2023-04-26 ENCOUNTER — Inpatient Hospital Stay (HOSPITAL_BASED_OUTPATIENT_CLINIC_OR_DEPARTMENT_OTHER): Payer: 59 | Admitting: Oncology

## 2023-04-26 VITALS — BP 96/60 | HR 71 | Temp 96.6°F | Resp 18 | Wt 138.2 lb

## 2023-04-26 DIAGNOSIS — D631 Anemia in chronic kidney disease: Secondary | ICD-10-CM | POA: Diagnosis present

## 2023-04-26 DIAGNOSIS — C9000 Multiple myeloma not having achieved remission: Secondary | ICD-10-CM

## 2023-04-26 DIAGNOSIS — Z7189 Other specified counseling: Secondary | ICD-10-CM | POA: Diagnosis not present

## 2023-04-26 DIAGNOSIS — N1831 Chronic kidney disease, stage 3a: Secondary | ICD-10-CM | POA: Diagnosis present

## 2023-04-26 LAB — CMP (CANCER CENTER ONLY)
ALT: 29 U/L (ref 0–44)
AST: 22 U/L (ref 15–41)
Albumin: 3.2 g/dL — ABNORMAL LOW (ref 3.5–5.0)
Alkaline Phosphatase: 61 U/L (ref 38–126)
Anion gap: 7 (ref 5–15)
BUN: 35 mg/dL — ABNORMAL HIGH (ref 8–23)
CO2: 27 mmol/L (ref 22–32)
Calcium: 8.5 mg/dL — ABNORMAL LOW (ref 8.9–10.3)
Chloride: 100 mmol/L (ref 98–111)
Creatinine: 1.51 mg/dL — ABNORMAL HIGH (ref 0.61–1.24)
GFR, Estimated: 49 mL/min — ABNORMAL LOW (ref >60)
Glucose, Bld: 157 mg/dL — ABNORMAL HIGH (ref 70–99)
Potassium: 4.1 mmol/L (ref 3.5–5.1)
Sodium: 134 mmol/L — ABNORMAL LOW (ref 135–145)
Total Bilirubin: 0.4 mg/dL (ref 0.0–1.2)
Total Protein: 8.2 g/dL — ABNORMAL HIGH (ref 6.5–8.1)

## 2023-04-26 LAB — CBC (CANCER CENTER ONLY)
HCT: 24.2 % — ABNORMAL LOW (ref 39.0–52.0)
Hemoglobin: 7.9 g/dL — ABNORMAL LOW (ref 13.0–17.0)
MCH: 31.5 pg (ref 26.0–34.0)
MCHC: 32.6 g/dL (ref 30.0–36.0)
MCV: 96.4 fL (ref 80.0–100.0)
Platelet Count: 245 10*3/uL (ref 150–400)
RBC: 2.51 MIL/uL — ABNORMAL LOW (ref 4.22–5.81)
RDW: 15.2 % (ref 11.5–15.5)
WBC Count: 3 10*3/uL — ABNORMAL LOW (ref 4.0–10.5)
nRBC: 0 % (ref 0.0–0.2)

## 2023-04-27 ENCOUNTER — Other Ambulatory Visit: Payer: Self-pay

## 2023-04-27 LAB — KAPPA/LAMBDA LIGHT CHAINS
Kappa free light chain: 4.2 mg/L (ref 3.3–19.4)
Kappa, lambda light chain ratio: 0.03 — ABNORMAL LOW (ref 0.26–1.65)
Lambda free light chains: 130.6 mg/L — ABNORMAL HIGH (ref 5.7–26.3)

## 2023-04-28 ENCOUNTER — Encounter: Payer: Self-pay | Admitting: Oncology

## 2023-04-29 ENCOUNTER — Other Ambulatory Visit: Payer: Self-pay

## 2023-04-29 LAB — MULTIPLE MYELOMA PANEL, SERUM
Albumin SerPl Elph-Mcnc: 3.3 g/dL (ref 2.9–4.4)
Albumin/Glob SerPl: 0.8 (ref 0.7–1.7)
Alpha 1: 0.2 g/dL (ref 0.0–0.4)
Alpha2 Glob SerPl Elph-Mcnc: 0.7 g/dL (ref 0.4–1.0)
B-Globulin SerPl Elph-Mcnc: 0.7 g/dL (ref 0.7–1.3)
Gamma Glob SerPl Elph-Mcnc: 3.1 g/dL — ABNORMAL HIGH (ref 0.4–1.8)
Globulin, Total: 4.7 g/dL — ABNORMAL HIGH (ref 2.2–3.9)
IgA: 10 mg/dL — ABNORMAL LOW (ref 61–437)
IgG (Immunoglobin G), Serum: 4366 mg/dL — ABNORMAL HIGH (ref 603–1613)
IgM (Immunoglobulin M), Srm: <5 mg/dL — ABNORMAL LOW (ref 20–172)
M Protein SerPl Elph-Mcnc: 2.8 g/dL — ABNORMAL HIGH
Total Protein ELP: 8 g/dL (ref 6.0–8.5)

## 2023-04-30 ENCOUNTER — Other Ambulatory Visit: Payer: Self-pay | Admitting: Pharmacist

## 2023-04-30 ENCOUNTER — Inpatient Hospital Stay: Payer: 59

## 2023-04-30 ENCOUNTER — Other Ambulatory Visit: Payer: Self-pay

## 2023-04-30 ENCOUNTER — Other Ambulatory Visit (HOSPITAL_COMMUNITY): Payer: Self-pay

## 2023-04-30 VITALS — BP 121/79 | HR 73 | Temp 96.1°F | Resp 19

## 2023-04-30 DIAGNOSIS — C9000 Multiple myeloma not having achieved remission: Secondary | ICD-10-CM

## 2023-04-30 DIAGNOSIS — N183 Chronic kidney disease, stage 3 unspecified: Secondary | ICD-10-CM

## 2023-04-30 MED ORDER — EPOETIN ALFA-EPBX 40000 UNIT/ML IJ SOLN
40000.0000 [IU] | INTRAMUSCULAR | Status: DC
Start: 2023-04-30 — End: 2023-04-30
  Administered 2023-04-30: 40000 [IU] via SUBCUTANEOUS
  Filled 2023-04-30: qty 1

## 2023-04-30 MED ORDER — POMALIDOMIDE 3 MG PO CAPS: 3.0000 mg | ORAL_CAPSULE | Freq: Every day | ORAL | 0 refills | Status: DC

## 2023-04-30 MED ORDER — DARATUMUMAB-HYALURONIDASE-FIHJ 1800-30000 MG-UT/15ML ~~LOC~~ SOLN
1800.0000 mg | Freq: Once | SUBCUTANEOUS | Status: AC
  Administered 2023-04-30: 1800 mg via SUBCUTANEOUS
  Filled 2023-04-30: qty 15

## 2023-04-30 MED ORDER — ACETAMINOPHEN 325 MG PO TABS
650.0000 mg | ORAL_TABLET | Freq: Once | ORAL | Status: AC
Start: 2023-04-30 — End: 2023-04-30
  Administered 2023-04-30: 650 mg via ORAL
  Filled 2023-04-30: qty 2

## 2023-04-30 MED ORDER — DIPHENHYDRAMINE HCL 25 MG PO CAPS
50.0000 mg | ORAL_CAPSULE | Freq: Once | ORAL | Status: AC
  Administered 2023-04-30: 50 mg via ORAL
  Filled 2023-04-30: qty 2

## 2023-04-30 MED ORDER — DEXAMETHASONE 4 MG PO TABS
10.0000 mg | ORAL_TABLET | Freq: Once | ORAL | Status: AC
  Administered 2023-04-30: 10 mg via ORAL
  Filled 2023-04-30: qty 3

## 2023-04-30 NOTE — Patient Instructions (Signed)
[facility] - A DEPT OF [facility]  Discharge Instructions: Thank you for choosing [facility] to provide your oncology and hematology care.  If you have a lab appointment with the Cancer Center, please go directly to the Cancer Center and check in at the registration area.  Wear comfortable clothing and clothing appropriate for easy access to any Portacath or PICC line.   We strive to give you quality time with your provider. You may need to reschedule your appointment if you arrive late (15 or more minutes).  Arriving late affects you and other patients whose appointments are after yours.  Also, if you miss three or more appointments without notifying the office, you may be dismissed from the clinic at the provider's discretion.      For prescription refill requests, have your pharmacy contact our office and allow 72 hours for refills to be completed.    Today you received the following chemotherapy and/or immunotherapy agents Darzalex and retacrit.      To help prevent nausea and vomiting after your treatment, we encourage you to take your nausea medication as directed.  BELOW ARE SYMPTOMS THAT SHOULD BE REPORTED IMMEDIATELY: *FEVER GREATER THAN 100.4 F (38 C) OR HIGHER *CHILLS OR SWEATING *NAUSEA AND VOMITING THAT IS NOT CONTROLLED WITH YOUR NAUSEA MEDICATION *UNUSUAL SHORTNESS OF BREATH *UNUSUAL BRUISING OR BLEEDING *URINARY PROBLEMS (pain or burning when urinating, or frequent urination) *BOWEL PROBLEMS (unusual diarrhea, constipation, pain near the anus) TENDERNESS IN MOUTH AND THROAT WITH OR WITHOUT PRESENCE OF ULCERS (sore throat, sores in mouth, or a toothache) UNUSUAL RASH, SWELLING OR PAIN  UNUSUAL VAGINAL DISCHARGE OR ITCHING   Items with * indicate a potential emergency and should be followed up as soon as possible or go to the Emergency Department if any problems should occur.  Please show the CHEMOTHERAPY ALERT CARD or  IMMUNOTHERAPY ALERT CARD at check-in to the Emergency Department and triage nurse.  Should you have questions after your visit or need to cancel or reschedule your appointment, please contact [facility] - A DEPT OF [facility]  [phone] and follow the prompts.  Office hours are 8:00 a.m. to 4:30 p.m. Monday - Friday. Please note that voicemails left after 4:00 p.m. may not be returned until the following business day.  We are closed weekends and major holidays. You have access to a nurse at all times for urgent questions. Please call the main number to the clinic [phone] and follow the prompts.  For any non-urgent questions, you may also contact your provider using MyChart. We now offer e-Visits for anyone 1 and older to request care online for non-urgent symptoms. For details visit [email or web address].   Also download the MyChart app! Go to the app store, search "MyChart", open the app, select [place], and log in with your MyChart username and password.

## 2023-05-05 ENCOUNTER — Encounter: Payer: Self-pay | Admitting: Oncology

## 2023-05-10 ENCOUNTER — Encounter: Payer: Self-pay | Admitting: Oncology

## 2023-05-13 ENCOUNTER — Telehealth: Payer: Self-pay | Admitting: *Deleted

## 2023-05-13 ENCOUNTER — Encounter: Payer: Self-pay | Admitting: Oncology

## 2023-05-13 NOTE — Telephone Encounter (Signed)
Per [facility] pharmacist says that he can start the med pomalyst  today and take it to 2/20. And if he has any extra pills of the after that then please bring it and let Smith Robert look at it. He is agreeable about this and the lady that helps him she knows this instructions also

## 2023-05-13 NOTE — Telephone Encounter (Signed)
The girlfriend called at late afternoon and said that the pt. Got the bottle of the pomalyst and they need a refill now. The girlfriend is Shanyia Stines bruce at [phone]

## 2023-05-13 NOTE — Telephone Encounter (Signed)
Alyson can you please see where he is at with pomalyst? It needs to coincide with the next darzalex

## 2023-05-14 NOTE — Telephone Encounter (Signed)
The patient called back late yesterday and said he found his bottle of pomalyst and the bottle was empty and he needs to get another supply

## 2023-05-14 NOTE — Telephone Encounter (Signed)
He is supposed to stop pomalyst on 2/21 and restart 2/28 to coordinate with next cycle

## 2023-05-18 ENCOUNTER — Emergency Department: Payer: 59

## 2023-05-18 ENCOUNTER — Emergency Department
Admission: EM | Admit: 2023-05-18 | Discharge: 2023-05-18 | Disposition: A | Discharge status: Home / Self Care | Payer: 59 | Attending: Student in an Organized Health Care Education/Training Program | Admitting: Student in an Organized Health Care Education/Training Program

## 2023-05-18 DIAGNOSIS — E119 Type 2 diabetes mellitus without complications: Secondary | ICD-10-CM | POA: Insufficient documentation

## 2023-05-18 DIAGNOSIS — M25562 Pain in left knee: Secondary | ICD-10-CM | POA: Insufficient documentation

## 2023-05-18 DIAGNOSIS — S0990XA Unspecified injury of head, initial encounter: Secondary | ICD-10-CM | POA: Diagnosis present

## 2023-05-18 DIAGNOSIS — M25561 Pain in right knee: Secondary | ICD-10-CM | POA: Insufficient documentation

## 2023-05-18 DIAGNOSIS — I10 Essential (primary) hypertension: Secondary | ICD-10-CM | POA: Insufficient documentation

## 2023-05-18 DIAGNOSIS — W19XXXA Unspecified fall, initial encounter: Secondary | ICD-10-CM

## 2023-05-18 DIAGNOSIS — W01198A Fall on same level from slipping, tripping and stumbling with subsequent striking against other object, initial encounter: Secondary | ICD-10-CM | POA: Insufficient documentation

## 2023-05-18 DIAGNOSIS — Z8673 Personal history of transient ischemic attack (TIA), and cerebral infarction without residual deficits: Secondary | ICD-10-CM | POA: Diagnosis not present

## 2023-05-18 NOTE — ED Provider Notes (Signed)
[facility] Provider Note    Event Date/Time   First MD Initiated Contact with Patient 05/18/23 1723     (approximate)   History   Fall   HPI  Nicholas Weiss is a 73 y.o. male with PMH of hypertension, diabetes, multiple myeloma, stroke, substance abuse, seizure disorder who presents for evaluation after a fall.  Patient states he tripped off the curb and fell and hit his head.  He does take a blood thinner.  He denies headache at this time and states the only thing bothering him is his ear and his knees which have hurt for a while.      Physical Exam   Triage Vital Signs: ED Triage Vitals  Encounter Vitals Group     BP 05/18/23 1336 (!) 155/89     Systolic BP Percentile --      Diastolic BP Percentile --      Pulse Rate 05/18/23 1336 65     Resp 05/18/23 1336 17     Temp 05/18/23 1336 97.8 F (36.6 C)     Temp Source 05/18/23 1336 Oral     SpO2 05/18/23 1336 100 %     Weight 05/18/23 1335 138 lb 3.7 oz (62.7 kg)     Height 05/18/23 1335 5\' 10"  (1.778 m)     Head Circumference --      Peak Flow --      Pain Score 05/18/23 1335 8     Pain Loc --      Pain Education --      Exclude from Growth Chart --     Most recent vital signs: Vitals:   05/18/23 1336  BP: (!) 155/89  Pulse: 65  Resp: 17  Temp: 97.8 F (36.6 C)  SpO2: 100%    General: Awake, no distress.  CV:  Good peripheral perfusion. RRR Resp:  Normal effort. CTAB. Abd:  No distention.  Other:  Bilateral Tms are translucent, no focal  neuro deficits, no ataxia, strength is 5/5 in bilateral upper and lower extremities.   ED Results / Procedures / Treatments   Labs (all labs ordered are listed, but only abnormal results are displayed) Labs Reviewed - No data to display   RADIOLOGY  CT head and neck obtained, I interpreted the images as well as reviewed the radiologist report which was negative for any acute intracranial abnormalities.  No traumatic alignment or  fractures of the cervical spine.  There is mild chronic small vessel ischemic change of the cerebral hemispheric white matter.  Mild degenerative spondylosis at C5-6 and C6-7.  Chronic facet osteoarthritis on the left at C3-4 with sclerotic bone change.  PROCEDURES:  Critical Care performed: No  Procedures   MEDICATIONS ORDERED IN ED: Medications - No data to display   IMPRESSION / MDM / ASSESSMENT AND PLAN / ED COURSE  I reviewed the triage vital signs and the nursing notes.                             73 year old male presents for evaluation after a fall.  Patient was hypertensive in triage otherwise vital signs are stable.  Patient NAD on exam.  Differential diagnosis includes, but is not limited to, intracranial bleed, cervical spine fracture, muscle strain, laceration, abrasion, contusion, concussion.  Patient's presentation is most consistent with acute complicated illness / injury requiring diagnostic workup.  CT of the head and neck both negative.  Reviewed these results with the patient and he was given a copy of them.  Physical exam is reassuring.  Patient was advised on symptomatic management using Tylenol.  Patient describes his knee pain as chronic and has an appointment scheduled with orthopedics for this tomorrow which I advised him to attend.  He can use ice, heat as well as topical pain relievers.  He was given reassurance.  Patient and family member voiced understanding, all questions were answered and he was stable at discharge.  FINAL CLINICAL IMPRESSION(S) / ED DIAGNOSES   Final diagnoses:  Fall, initial encounter     Rx / DC Orders   ED Discharge Orders     None        Note:  This document was prepared using Dragon voice recognition software and may include unintentional dictation errors.   Cameron Ali, PA-C 05/18/23 1751    Willy Eddy, MD 05/18/23 1836

## 2023-05-18 NOTE — ED Triage Notes (Signed)
Pt here for right ear pain. Pt states he fell backwards Sun and hit his ear. Pt denies LOC, it on a blood thinner. Pt states his ear started hurting the next day. Pt also c/o bilateral knee pain for a while. Pt denies swelling just pain. Pt ambulatory to triage.

## 2023-05-18 NOTE — ED Triage Notes (Signed)
First nurse note: pt to ED for fall on Sunday. Reports did hit head, takes blood thinners. States ears started hurting, denies any bleeding

## 2023-05-18 NOTE — Discharge Instructions (Addendum)
You can take Tylenol as needed for your ear and knee pain.  You can use topical pain relievers including ice, heat and muscle creams.  Please follow-up with orthopedics tomorrow.  Results of the CT scans are below:  HEAD CT:    No acute or traumatic finding. Mild chronic small-vessel ischemic  change of the cerebral hemispheric white matter.    CERVICAL SPINE CT:    No acute or traumatic finding. Mild degenerative spondylosis at C5-6  and C6-7. Chronic facet osteoarthritis on the left at C3-4 with  sclerotic bone change which could be a cause regional neck pain.

## 2023-05-18 NOTE — ED Provider Triage Note (Signed)
Emergency Medicine Provider Triage Evaluation Note  Nicholas Weiss , a 73 y.o. male  was evaluated in triage.  Pt complains of fall on Sunday, head injury, history of brain bleed.  States ears are sore  Review of Systems  Positive:  Negative:   Physical Exam  BP (!) 155/89   Pulse 65   Temp 97.8 F (36.6 C) (Oral)   Resp 17   Ht 5\' 10"  (1.778 m)   Wt 62.7 kg   SpO2 100%   BMI 19.83 kg/m  Gen:   Awake, no distress   Resp:  Normal effort  MSK:   Moves extremities without difficulty  Other:    Medical Decision Making  Medically screening exam initiated at 1:39 PM.  Appropriate orders placed.  EDNA GROVER was informed that the remainder of the evaluation will be completed by another provider, this initial triage assessment does not replace that evaluation, and the importance of remaining in the ED until their evaluation is complete.     Faythe Ghee, PA-C 05/18/23 1339

## 2023-05-21 ENCOUNTER — Telehealth: Payer: Self-pay | Admitting: Oncology

## 2023-05-21 NOTE — Telephone Encounter (Signed)
Patient called and left message with our answering service. He wanted to know what time his appt was today 05/21/23. I informed patient that his appt is next Friday 05/28/23 @ 9 am., according to his schedule. He was in agreement.

## 2023-05-28 ENCOUNTER — Encounter: Payer: Self-pay | Admitting: Oncology

## 2023-05-28 ENCOUNTER — Inpatient Hospital Stay: Payer: 59

## 2023-05-28 ENCOUNTER — Other Ambulatory Visit: Payer: Self-pay

## 2023-05-28 ENCOUNTER — Inpatient Hospital Stay: Payer: 59 | Attending: Oncology

## 2023-05-28 ENCOUNTER — Inpatient Hospital Stay (HOSPITAL_BASED_OUTPATIENT_CLINIC_OR_DEPARTMENT_OTHER): Payer: 59 | Admitting: Oncology

## 2023-05-28 VITALS — BP 156/88 | HR 64 | Temp 98.8°F | Resp 18 | Wt 153.3 lb

## 2023-05-28 VITALS — BP 204/110 | HR 63 | Temp 95.0°F | Resp 19

## 2023-05-28 DIAGNOSIS — D631 Anemia in chronic kidney disease: Secondary | ICD-10-CM | POA: Insufficient documentation

## 2023-05-28 DIAGNOSIS — D508 Other iron deficiency anemias: Secondary | ICD-10-CM | POA: Diagnosis not present

## 2023-05-28 DIAGNOSIS — Z5112 Encounter for antineoplastic immunotherapy: Secondary | ICD-10-CM | POA: Diagnosis present

## 2023-05-28 DIAGNOSIS — Z5111 Encounter for antineoplastic chemotherapy: Secondary | ICD-10-CM

## 2023-05-28 DIAGNOSIS — C9000 Multiple myeloma not having achieved remission: Secondary | ICD-10-CM | POA: Diagnosis not present

## 2023-05-28 DIAGNOSIS — Z79899 Other long term (current) drug therapy: Secondary | ICD-10-CM | POA: Diagnosis not present

## 2023-05-28 DIAGNOSIS — N1831 Chronic kidney disease, stage 3a: Secondary | ICD-10-CM | POA: Insufficient documentation

## 2023-05-28 DIAGNOSIS — D649 Anemia, unspecified: Secondary | ICD-10-CM

## 2023-05-28 DIAGNOSIS — N183 Chronic kidney disease, stage 3 unspecified: Secondary | ICD-10-CM

## 2023-05-28 LAB — CBC WITH DIFFERENTIAL/PLATELET
Abs Immature Granulocytes: 0 10*3/uL (ref 0.00–0.07)
Basophils Absolute: 0 10*3/uL (ref 0.0–0.1)
Basophils Relative: 1 %
Eosinophils Absolute: 0.1 10*3/uL (ref 0.0–0.5)
Eosinophils Relative: 4 %
HCT: 24.7 % — ABNORMAL LOW (ref 39.0–52.0)
Hemoglobin: 7.9 g/dL — ABNORMAL LOW (ref 13.0–17.0)
Immature Granulocytes: 0 %
Lymphocytes Relative: 44 %
Lymphs Abs: 1.3 10*3/uL (ref 0.7–4.0)
MCH: 30.3 pg (ref 26.0–34.0)
MCHC: 32 g/dL (ref 30.0–36.0)
MCV: 94.6 fL (ref 80.0–100.0)
Monocytes Absolute: 0.3 10*3/uL (ref 0.1–1.0)
Monocytes Relative: 9 %
Neutro Abs: 1.3 10*3/uL — ABNORMAL LOW (ref 1.7–7.7)
Neutrophils Relative %: 42 %
Platelets: 241 10*3/uL (ref 150–400)
RBC: 2.61 MIL/uL — ABNORMAL LOW (ref 4.22–5.81)
RDW: 15.3 % (ref 11.5–15.5)
WBC: 3 10*3/uL — ABNORMAL LOW (ref 4.0–10.5)
nRBC: 0 % (ref 0.0–0.2)

## 2023-05-28 LAB — COMPREHENSIVE METABOLIC PANEL
ALT: 19 U/L (ref 0–44)
AST: 17 U/L (ref 15–41)
Albumin: 3.2 g/dL — ABNORMAL LOW (ref 3.5–5.0)
Alkaline Phosphatase: 54 U/L (ref 38–126)
Anion gap: 7 (ref 5–15)
BUN: 28 mg/dL — ABNORMAL HIGH (ref 8–23)
CO2: 26 mmol/L (ref 22–32)
Calcium: 8.3 mg/dL — ABNORMAL LOW (ref 8.9–10.3)
Chloride: 102 mmol/L (ref 98–111)
Creatinine, Ser: 1.43 mg/dL — ABNORMAL HIGH (ref 0.61–1.24)
GFR, Estimated: 53 mL/min — ABNORMAL LOW (ref >60)
Glucose, Bld: 171 mg/dL — ABNORMAL HIGH (ref 70–99)
Potassium: 3.9 mmol/L (ref 3.5–5.1)
Sodium: 135 mmol/L (ref 135–145)
Total Bilirubin: 0.5 mg/dL (ref 0.0–1.2)
Total Protein: 8.1 g/dL (ref 6.5–8.1)

## 2023-05-28 LAB — IRON AND TIBC
Iron: 66 ug/dL (ref 45–182)
Saturation Ratios: 25 % (ref 17.9–39.5)
TIBC: 269 ug/dL (ref 250–450)
UIBC: 203 ug/dL

## 2023-05-28 LAB — FERRITIN: Ferritin: 19 ng/mL — ABNORMAL LOW (ref 24–336)

## 2023-05-28 MED ORDER — ACETAMINOPHEN 325 MG PO TABS
650.0000 mg | ORAL_TABLET | Freq: Once | ORAL | Status: AC
  Administered 2023-05-28: 650 mg via ORAL
  Filled 2023-05-28: qty 2

## 2023-05-28 MED ORDER — DARATUMUMAB-HYALURONIDASE-FIHJ 1800-30000 MG-UT/15ML ~~LOC~~ SOLN
1800.0000 mg | Freq: Once | SUBCUTANEOUS | Status: AC
  Administered 2023-05-28: 1800 mg via SUBCUTANEOUS
  Filled 2023-05-28: qty 15

## 2023-05-28 MED ORDER — IRON SUCROSE 20 MG/ML IV SOLN
200.0000 mg | INTRAVENOUS | Status: DC
  Administered 2023-05-28: 200 mg via INTRAVENOUS

## 2023-05-28 MED ORDER — DEXAMETHASONE 4 MG PO TABS
10.0000 mg | ORAL_TABLET | Freq: Once | ORAL | Status: AC
  Administered 2023-05-28: 10 mg via ORAL
  Filled 2023-05-28: qty 3

## 2023-05-28 MED ORDER — DIPHENHYDRAMINE HCL 25 MG PO CAPS
50.0000 mg | ORAL_CAPSULE | Freq: Once | ORAL | Status: AC
  Administered 2023-05-28: 50 mg via ORAL
  Filled 2023-05-28: qty 2

## 2023-05-28 NOTE — Progress Notes (Addendum)
 Hematology/Oncology Consult note [facility]  Telephone:([phone] [phone]  Patient Care Team: Marisue Ivan, MD as PCP - General (Family Medicine) Pa, [facility] (Optometry) Creig Hines, MD as Consulting Physician (Oncology)   Name of the patient: Nicholas Weiss  [id number]  1950/06/07   Date of visit: 05/28/23  Diagnosis-  high risk IgG lambda multiple myeloma R-ISS stage III   Chief complaint/ Reason for visit-on treatment assessment prior to next cycle of Darzalex  Heme/Onc history:  Patient is a 73 year old African-American male with a past medical history significant for uncontrolled type 1 diabetes, chronic kidney disease stage III chronic normocytic anemia referred for abnormal SPEP. Patient's creatinine has been fluctuating between 1.5-2 but about 5 months ago it went up all the way to 5 and his blood sugars were in the 1000 range. More recently his kidney numbers have been drifting back to normal values. As a part of the work-up he had serum protein electrophoresis done which showed an elevated gammaglobulin fraction with an M spike of 3%. The amount of M spike was not quantified in the specimen. He has therefore been referred to [place] for further management. Patient endorses chronic fatigue reports that his appetite and weight have remained stable   Results of blood work from 04/08/2020 were as follows: CBC showed white count of 2.9, H&H of 10.5/31.2 with an MCV of 91 and a platelet count of 191.  CMP showed a mildly elevated creatinine of 1.2 which was better as compared to 5 months ago when it was 3.9.  Total protein was mildly elevated at 9.1 calcium normal at 8.4 ferritin and iron studies B12 folate TSH and haptoglobin were normal.  Myeloma panel revealed an elevated IgG level of 06/04/2004 with an M protein of 3.1 g.  Immunofixation showed IgG lambda specificity.  Serum free light chain ratio was elevated at 25 and free light  chain lambda elevated at 370   Further myeloma work-up including a PET CT scan did not reveal any evidence of edematous lesions.  Bone marrow biopsy showed 23% plasma cells by manual count and 30% by CD138 IHC.  Normal cytogenetics.  FISH studies for myeloma showed gain of 1 q.  13 q-. detected.  P53 not detected.   He was treated for a year and has having smoldering multiple myeloma since both CKD and anemia have been stable for 3 to 4 years and could be secondary to uncontrolled diabetes.In June 2023 IgG levels increased to 5463 with an M protein of 4.1 g as compared to 2.6 g in September 2022.  Serum free light chain ratio remains around 25.  Therefore a repeat bone marrow biopsy was done which shows further increase in plasma cells ranging from 30 to 70% and by immunohistochemistry 60 to 70% of the cells were positive for CD138.  Cytogenetics normal.  FISH study showed 4: 14 translocation and gain of 1 q. making this high risk RISS stage III.  Repeat PET scan showed no lytic lesions   Patient is not a transplant candidate and was started on Velcade Revlimid dexamethasone regimen in July 2023.  He was also referred to [facility] for second opinion but did not go.  Treatment complicated by repeated hospitalizations for uncontrolled blood sugars.  He has been frequently hospitalized thereby causing multiple interruptions in his treatment. Due to lack of adequate response to Vrd darzalex was added in may 2024. M protein remained around 2 g despite adding Darzalex to  the regimen and therefore plan to switch to Pomalyst plus Darzalex and stop Velcade and Revlimid altogether starting August 2024    Interval history-patient has not taken Pomalyst with his last cycle but plans to start taking Pomalyst with this cycle.  He had another episode of fall and was in the ER.  He had CT head and CT cervical spine which was Negative for any fractures.  ECOG PS- 2 Pain scale- 0   Review of systems- Review of Systems   Constitutional:  Positive for malaise/fatigue. Negative for chills, fever and weight loss.  HENT:  Negative for congestion, ear discharge and nosebleeds.   Eyes:  Negative for blurred vision.  Respiratory:  Negative for cough, hemoptysis, sputum production, shortness of breath and wheezing.   Cardiovascular:  Negative for chest pain, palpitations, orthopnea and claudication.  Gastrointestinal:  Negative for abdominal pain, blood in stool, constipation, diarrhea, heartburn, melena, nausea and vomiting.  Genitourinary:  Negative for dysuria, flank pain, frequency, hematuria and urgency.  Musculoskeletal:  Positive for falls. Negative for back pain, joint pain and myalgias.  Skin:  Negative for rash.  Neurological:  Negative for dizziness, tingling, focal weakness, seizures, weakness and headaches.  Endo/Heme/Allergies:  Does not bruise/bleed easily.  Psychiatric/Behavioral:  Negative for depression and suicidal ideas. The patient does not have insomnia.       Allergies  Allergen Reactions   Penicillins Other (See Comments)    Childhood allergy -tolerated amoxil 03/2022 Unknown reaction Has patient had a PCN reaction causing immediate rash, facial/tongue/throat swelling, SOB or lightheadedness with hypotension: No Has patient had a PCN reaction causing severe rash involving mucus membranes or skin necrosis: No Has patient had a PCN reaction that required hospitalization No Has patient had a PCN reaction occurring within the last 10 years: No If all of the above answers are "NO", then may proceed with Cephalosporin use.      Past Medical History:  Diagnosis Date   Acute metabolic encephalopathy 12/02/2020   DKA (diabetic ketoacidoses) 04/06/2016   Hypercholesteremia    Hypertension      Past Surgical History:  Procedure Laterality Date   AMPUTATION TOE Left 04/25/2022   Procedure: LEFT GREAT TOE AMPUTATION AND LEFT PARTIAL 2ND TOE AMPUTATION;  Surgeon: Felecia Shelling, DPM;   Location: [facility] ORS;  Service: Podiatry;  Laterality: Left;   COLONOSCOPY WITH PROPOFOL N/A 02/01/2020   Procedure: COLONOSCOPY WITH PROPOFOL;  Surgeon: Toney Reil, MD;  Location: [facility] ENDOSCOPY;  Service: Gastroenterology;  Laterality: N/A;   ESOPHAGOGASTRODUODENOSCOPY  02/01/2020   Procedure: ESOPHAGOGASTRODUODENOSCOPY (EGD);  Surgeon: Toney Reil, MD;  Location: [facility] ENDOSCOPY;  Service: Gastroenterology;;   ESOPHAGOGASTRODUODENOSCOPY (EGD) WITH PROPOFOL N/A 06/07/2022   Procedure: ESOPHAGOGASTRODUODENOSCOPY (EGD) WITH PROPOFOL;  Surgeon: Kerin Salen, MD;  Location: [facility] ENDOSCOPY;  Service: Gastroenterology;  Laterality: N/A;   KNEE SURGERY Right    Torn meniscus   KNEE SURGERY Left     Social History   Socioeconomic History   Marital status: Single    Spouse name: Not on file   Number of children: 3   Years of education: Not on file   Highest education level: High school graduate  Occupational History    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  Tobacco Use   Smoking status: Never   Smokeless tobacco: Never  Vaping Use   Vaping status: Never Used  Substance and Sexual Activity   Alcohol use: Not Currently    Alcohol/week: 0.0 - 1.0 standard drinks of alcohol  Comment: "once every 2 months"   Drug use: Yes    Types: Marijuana, "Crack" cocaine    Comment: last week   Sexual activity: Not Currently    Birth control/protection: None  Other Topics Concern   Not on file  Social History Narrative   Lives with girlfriend "sharon"   Social Drivers of Health   Financial Resource Strain: Low Risk  (05/19/2023)   Received from [facility]   Overall Financial Resource Strain (CARDIA)    Difficulty of Paying Living Expenses: Not hard at all  Food Insecurity: No Food Insecurity (05/19/2023)   Received from [facility]   Hunger Vital Sign    Worried About Running Out of Food in the Last Year: Never true    Ran Out of Food in the Last Year:  Never true  Transportation Needs: No Transportation Needs (05/19/2023)   Received from [facility] - Transportation    In the past 12 months, has lack of transportation kept you from medical appointments or from getting medications?: No    Lack of Transportation (Non-Medical): No  Physical Activity: Insufficiently Active (01/01/2020)   Exercise Vital Sign    Days of Exercise per Week: 7 days    Minutes of Exercise per Session: 20 min  Stress: No Stress Concern Present (01/01/2020)   [employer] of Occupational Health - Occupational Stress Questionnaire    Feeling of Stress : Not at all  Social Connections: Moderately Isolated (03/30/2023)   Social Connection and Isolation Panel [NHANES]    Frequency of Communication with Friends and Family: More than three times a week    Frequency of Social Gatherings with Friends and Family: More than three times a week    Attends Religious Services: Never    Database administrator or Organizations: No    Attends Banker Meetings: Never    Marital Status: Living with partner  Intimate Partner Violence: Patient Unable To Answer (03/27/2023)   Humiliation, Afraid, Rape, and Kick questionnaire    Fear of Current or Ex-Partner: Patient unable to answer    Emotionally Abused: Patient unable to answer    Physically Abused: Patient unable to answer    Sexually Abused: Patient unable to answer    Family History  Problem Relation Age of Onset   Heart attack Father    Hypertension Sister    Cancer Sister      Current Outpatient Medications:    acetaminophen (TYLENOL) 500 MG tablet, Take 1,000 mg by mouth daily as needed for mild pain or moderate pain., Disp: , Rfl:    apixaban (ELIQUIS) 5 MG TABS tablet, Take 1 tablet (5 mg total) by mouth 2 (two) times daily. Start Eliquis after 2 weeks which will be 03/02/2023, Disp: , Rfl:    ascorbic acid (VITAMIN C) 500 MG tablet, Take 1 tablet (500 mg total) by mouth  daily., Disp: 90 tablet, Rfl: 0   atorvastatin (LIPITOR) 80 MG tablet, Take 1 tablet (80 mg total) by mouth at bedtime. Hold while taking Paxlovid, Disp: 30 tablet, Rfl: 2   bisacodyl 5 MG EC tablet, Take 1 tablet (5 mg total) by mouth at bedtime. Skip the dose if no constipation, Disp: , Rfl:    Continuous Glucose Sensor (DEXCOM G7 SENSOR) MISC, 1 each by Does not apply route 4 (four) times daily -  before meals and at bedtime., Disp: 1 each, Rfl: 0   fludrocortisone (FLORINEF) 0.1 MG tablet,  Take 1 tablet (0.1 mg total) by mouth daily. Skip the dose if SBP >130, Disp: , Rfl:    gabapentin (NEURONTIN) 300 MG capsule, Take 300 mg by mouth 2 (two) times daily., Disp: , Rfl:    hydrALAZINE (APRESOLINE) 25 MG tablet, Take 1 tablet (25 mg total) by mouth 3 (three) times daily as needed (SBP >160)., Disp: , Rfl:    Insulin Aspart FlexPen (NOVOLOG) 100 UNIT/ML, Sliding Scale: Glucose 70 - 120: 0 units Glucose 121 - 150: 1 unit Glucose 151 - 200: 2 units Glucose 201 - 250: 3 units Glucose 251 - 300: 5 units Glucose 301 - 350: 7 units Glucose 351 - 400: 9 units Glucose > 400: call your docotr, Disp: , Rfl:    insulin degludec (TRESIBA FLEXTOUCH) 100 UNIT/ML FlexTouch Pen, Inject 5 Units into the skin daily. Home med., Disp: , Rfl:    insulin lispro (HUMALOG) 100 UNIT/ML KwikPen, Inject up to 50 units daily as directed, Disp: , Rfl:    iron polysaccharides (NIFEREX) 150 MG capsule, Take 1 capsule (150 mg total) by mouth daily., Disp: , Rfl:    levETIRAcetam (KEPPRA) 500 MG tablet, Take 1 tablet (500 mg total) by mouth 2 (two) times daily., Disp: 60 tablet, Rfl: 2   Multiple Vitamin (MULTIVITAMIN WITH MINERALS) TABS tablet, Take 1 tablet by mouth daily., Disp: 90 tablet, Rfl: 1   NOVOLOG 100 UNIT/ML injection, Sliding Scale: Glucose 70 - 120: 0 units  Glucose 121 - 150: 1 unit  Glucose 151 - 200: 2 units Glucose 201 - 250: 3 units  Glucose 251 - 300: 5 units  Glucose 301 - 350: 7 units  Glucose 351 - 400: 9 units   Glucose > 400: call your docotr, Disp: 10 mL, Rfl: 1   ondansetron (ZOFRAN) 8 MG tablet, Take 8 mg by mouth daily as needed for nausea or vomiting., Disp: , Rfl:    pantoprazole (PROTONIX) 40 MG tablet, Take 40 mg by mouth daily., Disp: , Rfl:    polyethylene glycol (MIRALAX) 17 g packet, Take 17 g by mouth 2 (two) times daily. Skip the dose if no constipation, Disp: , Rfl:    pomalidomide (POMALYST) 3 MG capsule, Take 1 capsule (3 mg total) by mouth daily. Take for 21 days, the hold for 7 days. Repeat every 28 days., Disp: 21 capsule, Rfl: 0   traZODone (DESYREL) 50 MG tablet, Take 0.5 tablets (25 mg total) by mouth at bedtime as needed for sleep., Disp: , Rfl:  No current facility-administered medications for this visit.  Facility-Administered Medications Ordered in Other Visits:    daratumumab-hyaluronidase-fihj (DARZALEX FASPRO) 1800-30000 MG-UT/15ML chemo SQ injection 1,800 mg, 1,800 mg, Subcutaneous, Once, Creig Hines, MD   iron sucrose (VENOFER) injection 200 mg, 200 mg, Intravenous, Weekly, Creig Hines, MD  Physical exam:  Vitals:   05/28/23 0937  BP: (!) 156/88  Pulse: 64  Resp: 18  Temp: 98.8 F (37.1 C)  TempSrc: Tympanic  SpO2: 100%  Weight: 153 lb 4.8 oz (69.5 kg)   Physical Exam Cardiovascular:     Rate and Rhythm: Normal rate and regular rhythm.     Heart sounds: Normal heart sounds.  Pulmonary:     Effort: Pulmonary effort is normal.     Breath sounds: Normal breath sounds.  Abdominal:     General: Bowel sounds are normal.     Palpations: Abdomen is soft.  Musculoskeletal:     Right lower leg: No edema.  Left lower leg: No edema.  Skin:    General: Skin is warm and dry.  Neurological:     Mental Status: He is alert and oriented to person, place, and time.         Latest Ref Rng & Units 05/28/2023    9:11 AM  CMP  Glucose 70 - 99 mg/dL 621   BUN 8 - 23 mg/dL 28   Creatinine 3.08 - 1.24 mg/dL 6.57   Sodium 846 - 962 mmol/L 135   Potassium 3.5  - 5.1 mmol/L 3.9   Chloride 98 - 111 mmol/L 102   CO2 22 - 32 mmol/L 26   Calcium 8.9 - 10.3 mg/dL 8.3   Total Protein 6.5 - 8.1 g/dL 8.1   Total Bilirubin 0.0 - 1.2 mg/dL 0.5   Alkaline Phos 38 - 126 U/L 54   AST 15 - 41 U/L 17   ALT 0 - 44 U/L 19       Latest Ref Rng & Units 05/28/2023    9:11 AM  CBC  WBC 4.0 - 10.5 K/uL 3.0   Hemoglobin 13.0 - 17.0 g/dL 7.9   Hematocrit 95.2 - 52.0 % 24.7   Platelets 150 - 400 K/uL 241     No images are attached to the encounter.  CT Head Wo Contrast Result Date: 05/18/2023 CLINICAL DATA:  Neck trauma.  Fell 2 days ago. EXAM: CT HEAD WITHOUT CONTRAST CT CERVICAL SPINE WITHOUT CONTRAST TECHNIQUE: Multidetector CT imaging of the head and cervical spine was performed following the standard protocol without intravenous contrast. Multiplanar CT image reconstructions of the cervical spine were also generated. RADIATION DOSE REDUCTION: This exam was performed according to the departmental dose-optimization program which includes automated exposure control, adjustment of the mA and/or kV according to patient size and/or use of iterative reconstruction technique. COMPARISON:  03/26/2023 FINDINGS: CT HEAD FINDINGS Brain: No evidence of accelerated brain atrophy. Mild chronic small-vessel ischemic change of the cerebral hemispheric white matter. No evidence of acute or large vessel territory stroke. No intra-axial mass, hemorrhage, hydrocephalus or extra-axial collection. Patient has multiple bulky dural calcifications, which can be seen in normal person's. No finding to suggest that any of the specifically represent a meningioma or would be of clinical significance. Vascular: There is atherosclerotic calcification of the major vessels at the base of the brain. Skull: Negative Sinuses/Orbits: Clear/normal Other: None CT CERVICAL SPINE FINDINGS Alignment: Normal Skull base and vertebrae: No regional fracture or focal bone lesion. Soft tissues and spinal canal: No  significant soft tissue finding in the region. Disc levels: Mild degenerative spondylosis at C5-6 and C6-7 with small endplate osteophytes but no significant canal or foraminal narrowing. Chronic facet osteoarthritis on the left at C3-4 with sclerotic bone change which could be a cause regional neck pain. Upper chest: Negative Other: None IMPRESSION: HEAD CT: No acute or traumatic finding. Mild chronic small-vessel ischemic change of the cerebral hemispheric white matter. CERVICAL SPINE CT: No acute or traumatic finding. Mild degenerative spondylosis at C5-6 and C6-7. Chronic facet osteoarthritis on the left at C3-4 with sclerotic bone change which could be a cause regional neck pain. Electronically Signed   By: Paulina Fusi M.D.   On: 05/18/2023 15:25   CT Cervical Spine Wo Contrast Result Date: 05/18/2023 CLINICAL DATA:  Neck trauma.  Fell 2 days ago. EXAM: CT HEAD WITHOUT CONTRAST CT CERVICAL SPINE WITHOUT CONTRAST TECHNIQUE: Multidetector CT imaging of the head and cervical spine was performed following the standard protocol without  intravenous contrast. Multiplanar CT image reconstructions of the cervical spine were also generated. RADIATION DOSE REDUCTION: This exam was performed according to the departmental dose-optimization program which includes automated exposure control, adjustment of the mA and/or kV according to patient size and/or use of iterative reconstruction technique. COMPARISON:  03/26/2023 FINDINGS: CT HEAD FINDINGS Brain: No evidence of accelerated brain atrophy. Mild chronic small-vessel ischemic change of the cerebral hemispheric white matter. No evidence of acute or large vessel territory stroke. No intra-axial mass, hemorrhage, hydrocephalus or extra-axial collection. Patient has multiple bulky dural calcifications, which can be seen in normal person's. No finding to suggest that any of the specifically represent a meningioma or would be of clinical significance. Vascular: There is  atherosclerotic calcification of the major vessels at the base of the brain. Skull: Negative Sinuses/Orbits: Clear/normal Other: None CT CERVICAL SPINE FINDINGS Alignment: Normal Skull base and vertebrae: No regional fracture or focal bone lesion. Soft tissues and spinal canal: No significant soft tissue finding in the region. Disc levels: Mild degenerative spondylosis at C5-6 and C6-7 with small endplate osteophytes but no significant canal or foraminal narrowing. Chronic facet osteoarthritis on the left at C3-4 with sclerotic bone change which could be a cause regional neck pain. Upper chest: Negative Other: None IMPRESSION: HEAD CT: No acute or traumatic finding. Mild chronic small-vessel ischemic change of the cerebral hemispheric white matter. CERVICAL SPINE CT: No acute or traumatic finding. Mild degenerative spondylosis at C5-6 and C6-7. Chronic facet osteoarthritis on the left at C3-4 with sclerotic bone change which could be a cause regional neck pain. Electronically Signed   By: Paulina Fusi M.D.   On: 05/18/2023 15:25     Assessment and plan- Patient is a 73 y.o. male with history of IgG lambda multiple myeloma R-ISS stage III here for on treatment assessment prior to next cycle of Darzalex  Patient did not take his Pomalyst with last cycle and will start along with this cycle.  Counts okay to proceed with Darzalex today and I will see him back in 4 weeks.  We have restarted treatment after he missed multiple treatments for the last 3 months due to hospitalizations.It is therefore difficult to assess response to treatment.  Myeloma panel is pending from today but last levels from January 2025 showed M protein of 2.8 g similar to what it was in June 2024.  Serum free light chain lambda was 130 again similar to what it was in July 2024.  If he is able to receive treatment on a consistent basis I am hoping his levels will come down with the present regimen.  PET scan prior to his next visit with  me  Normocytic anemia: Labs suggestive of iron deficiency and he will receive 5 doses of Venofer.  He is also receiving EPO every month which she will continue  Continue aspirin prophylaxis while he is on Pomalyst.  Last PET scan has not shown any active bone lesions and he has not been on any bisphosphonate so far   Visit Diagnosis 1. Multiple myeloma not having achieved remission (HCC)   2. High risk medication use   3. Encounter for antineoplastic chemotherapy   4. Other iron deficiency anemia   5. Erythropoietin (EPO) stimulating agent anemia management patient      Dr. Owens Shark, MD, MPH [facility] [phone] 05/28/2023 11:17 AM

## 2023-05-28 NOTE — Progress Notes (Signed)
 Patient bp 198/113 and 204/110 with dinamap 30 minutes post venofer and darzalex. Manual bp obtained and was 204/110. Smith Robert, MD notified. Per Smith Robert, MD ok to discharge as long as patient is asymptomatic. Pt denies any symptoms of hypertension. Pt educated to monitor bps at home and if it continues to stay elevated or he becomes symptomatic of hypertension, to seek emergency medical care. Pt verified understanding. Pt also educated to notify his PCP if his bp continues to stay elevated despite him taking his bp meds as prescribed. Pt verified understanding and was stable at discharge.

## 2023-05-29 ENCOUNTER — Other Ambulatory Visit: Payer: Self-pay

## 2023-05-31 LAB — KAPPA/LAMBDA LIGHT CHAINS
Kappa free light chain: 5.3 mg/L (ref 3.3–19.4)
Kappa, lambda light chain ratio: 0.02 — ABNORMAL LOW (ref 0.26–1.65)
Lambda free light chains: 220.3 mg/L — ABNORMAL HIGH (ref 5.7–26.3)

## 2023-06-01 LAB — MULTIPLE MYELOMA PANEL, SERUM
Albumin SerPl Elph-Mcnc: 3.4 g/dL (ref 2.9–4.4)
Albumin/Glob SerPl: 0.8 (ref 0.7–1.7)
Alpha 1: 0.2 g/dL (ref 0.0–0.4)
Alpha2 Glob SerPl Elph-Mcnc: 0.6 g/dL (ref 0.4–1.0)
B-Globulin SerPl Elph-Mcnc: 0.7 g/dL (ref 0.7–1.3)
Gamma Glob SerPl Elph-Mcnc: 3.1 g/dL — ABNORMAL HIGH (ref 0.4–1.8)
Globulin, Total: 4.6 g/dL — ABNORMAL HIGH (ref 2.2–3.9)
IgA: 10 mg/dL — ABNORMAL LOW (ref 61–437)
IgG (Immunoglobin G), Serum: 4228 mg/dL — ABNORMAL HIGH (ref 603–1613)
IgM (Immunoglobulin M), Srm: <5 mg/dL — ABNORMAL LOW (ref 20–172)
M Protein SerPl Elph-Mcnc: 2.9 g/dL — ABNORMAL HIGH
Total Protein ELP: 8 g/dL (ref 6.0–8.5)

## 2023-06-02 ENCOUNTER — Inpatient Hospital Stay: Payer: 59 | Attending: Oncology

## 2023-06-02 ENCOUNTER — Other Ambulatory Visit: Payer: Self-pay | Admitting: Oncology

## 2023-06-02 VITALS — BP 134/76 | HR 71 | Resp 16

## 2023-06-02 DIAGNOSIS — C9000 Multiple myeloma not having achieved remission: Secondary | ICD-10-CM | POA: Insufficient documentation

## 2023-06-02 DIAGNOSIS — N1831 Chronic kidney disease, stage 3a: Secondary | ICD-10-CM | POA: Insufficient documentation

## 2023-06-02 DIAGNOSIS — D509 Iron deficiency anemia, unspecified: Secondary | ICD-10-CM | POA: Diagnosis present

## 2023-06-02 DIAGNOSIS — D631 Anemia in chronic kidney disease: Secondary | ICD-10-CM | POA: Insufficient documentation

## 2023-06-02 DIAGNOSIS — N183 Chronic kidney disease, stage 3 unspecified: Secondary | ICD-10-CM

## 2023-06-02 MED ORDER — IRON SUCROSE 20 MG/ML IV SOLN
200.0000 mg | INTRAVENOUS | Status: DC
  Administered 2023-06-02: 200 mg via INTRAVENOUS
  Filled 2023-06-02: qty 10

## 2023-06-02 NOTE — Patient Instructions (Signed)

## 2023-06-03 ENCOUNTER — Telehealth: Payer: Self-pay | Admitting: *Deleted

## 2023-06-03 NOTE — Telephone Encounter (Signed)
 Dr. Assunta Gambles team received a prescription renewal for patient's pomalyst prescription on 05/30/23. I was uncertain exactly where patient was within his cycle. Last script sent on 04/30/23. Per Dr. Assunta Gambles charting- patient had missed most/part of a cycle due to illness.  I confirmed with Alyson in [facility] that patient does not require a RF at this time. She spoke w/patient's girlfriend yesterday and pt had plenty of pomalyst on hand at home.

## 2023-06-04 ENCOUNTER — Inpatient Hospital Stay: Payer: 59

## 2023-06-04 ENCOUNTER — Other Ambulatory Visit: Payer: Self-pay | Admitting: Oncology

## 2023-06-04 ENCOUNTER — Encounter: Payer: Self-pay | Admitting: Oncology

## 2023-06-04 ENCOUNTER — Other Ambulatory Visit: Payer: Self-pay

## 2023-06-04 VITALS — BP 144/91 | HR 70 | Temp 94.3°F | Resp 18

## 2023-06-04 DIAGNOSIS — C9000 Multiple myeloma not having achieved remission: Secondary | ICD-10-CM

## 2023-06-04 DIAGNOSIS — N183 Chronic kidney disease, stage 3 unspecified: Secondary | ICD-10-CM

## 2023-06-04 DIAGNOSIS — D509 Iron deficiency anemia, unspecified: Secondary | ICD-10-CM | POA: Diagnosis not present

## 2023-06-04 MED ORDER — IRON SUCROSE 20 MG/ML IV SOLN
200.0000 mg | INTRAVENOUS | Status: DC
  Administered 2023-06-04: 200 mg via INTRAVENOUS

## 2023-06-04 MED ORDER — POMALIDOMIDE 3 MG PO CAPS: 3.0000 mg | ORAL_CAPSULE | Freq: Every day | ORAL | 0 refills | Status: DC

## 2023-06-04 MED ORDER — SODIUM CHLORIDE 0.9% FLUSH
10.0000 mL | Freq: Once | INTRAVENOUS | Status: AC | PRN
Start: 2023-06-04 — End: 2023-06-04
  Administered 2023-06-04: 10 mL
  Filled 2023-06-04: qty 10

## 2023-06-07 ENCOUNTER — Ambulatory Visit
Admission: RE | Admit: 2023-06-07 | Discharge: 2023-06-07 | Disposition: A | Discharge status: Home / Self Care | Payer: 59 | Source: Ambulatory Visit | Attending: Oncology | Admitting: Oncology

## 2023-06-07 DIAGNOSIS — I3139 Other pericardial effusion (noninflammatory): Secondary | ICD-10-CM | POA: Insufficient documentation

## 2023-06-07 DIAGNOSIS — M170 Bilateral primary osteoarthritis of knee: Secondary | ICD-10-CM | POA: Insufficient documentation

## 2023-06-07 DIAGNOSIS — I2510 Atherosclerotic heart disease of native coronary artery without angina pectoris: Secondary | ICD-10-CM | POA: Diagnosis not present

## 2023-06-07 DIAGNOSIS — C9000 Multiple myeloma not having achieved remission: Secondary | ICD-10-CM | POA: Diagnosis present

## 2023-06-07 DIAGNOSIS — J90 Pleural effusion, not elsewhere classified: Secondary | ICD-10-CM | POA: Diagnosis not present

## 2023-06-07 DIAGNOSIS — I700 Atherosclerosis of aorta: Secondary | ICD-10-CM | POA: Insufficient documentation

## 2023-06-07 LAB — GLUCOSE, CAPILLARY: Glucose-Capillary: 94 mg/dL (ref 70–99)

## 2023-06-07 MED ORDER — FLUDEOXYGLUCOSE F - 18 (FDG) INJECTION
8.3500 | Freq: Once | INTRAVENOUS | Status: AC | PRN
  Administered 2023-06-07: 8.35 via INTRAVENOUS

## 2023-06-09 ENCOUNTER — Inpatient Hospital Stay: Payer: 59

## 2023-06-09 VITALS — BP 157/84 | HR 60 | Temp 95.0°F | Resp 17

## 2023-06-09 DIAGNOSIS — D509 Iron deficiency anemia, unspecified: Secondary | ICD-10-CM | POA: Diagnosis not present

## 2023-06-09 DIAGNOSIS — N183 Chronic kidney disease, stage 3 unspecified: Secondary | ICD-10-CM

## 2023-06-09 MED ORDER — IRON SUCROSE 20 MG/ML IV SOLN
200.0000 mg | INTRAVENOUS | Status: DC
  Administered 2023-06-09: 200 mg via INTRAVENOUS
  Filled 2023-06-09: qty 10

## 2023-06-09 MED ORDER — SODIUM CHLORIDE 0.9% FLUSH
10.0000 mL | Freq: Once | INTRAVENOUS | Status: AC | PRN
  Administered 2023-06-09: 10 mL
  Filled 2023-06-09: qty 10

## 2023-06-09 NOTE — Patient Instructions (Signed)

## 2023-06-11 ENCOUNTER — Inpatient Hospital Stay: Payer: 59

## 2023-06-11 ENCOUNTER — Telehealth: Payer: Self-pay | Admitting: *Deleted

## 2023-06-11 VITALS — BP 147/80 | HR 69 | Temp 95.2°F | Resp 18

## 2023-06-11 DIAGNOSIS — N183 Chronic kidney disease, stage 3 unspecified: Secondary | ICD-10-CM

## 2023-06-11 DIAGNOSIS — C9000 Multiple myeloma not having achieved remission: Secondary | ICD-10-CM

## 2023-06-11 DIAGNOSIS — D509 Iron deficiency anemia, unspecified: Secondary | ICD-10-CM | POA: Diagnosis not present

## 2023-06-11 MED ORDER — IRON SUCROSE 20 MG/ML IV SOLN
200.0000 mg | INTRAVENOUS | Status: DC
  Administered 2023-06-11: 200 mg via INTRAVENOUS
  Filled 2023-06-11: qty 10

## 2023-06-11 NOTE — Telephone Encounter (Signed)
 They want help to see if anyne can gt him. They ned help in order to get the pomalyst

## 2023-06-13 NOTE — Telephone Encounter (Signed)
 I was told he has enough pomalyst? Where do we stand presently?

## 2023-06-14 ENCOUNTER — Telehealth: Payer: Self-pay

## 2023-06-14 NOTE — Telephone Encounter (Signed)
 Called [employer] [facility] to sen pomalyst rep stated [facility] is preferred despite [facility] stating different

## 2023-06-14 NOTE — Telephone Encounter (Signed)
 Now pharmacy states he just needs to schedule delivery for next round. Tried to call no answer left mychart message

## 2023-06-14 NOTE — Telephone Encounter (Signed)
 [facility] stated he doesn't have [facility] anymore and each insurance has own pharmacy I will call and see what pharmacy we need to use for future references

## 2023-06-16 ENCOUNTER — Other Ambulatory Visit: Payer: Self-pay | Admitting: Oncology

## 2023-06-16 ENCOUNTER — Encounter: Payer: Self-pay | Admitting: Oncology

## 2023-06-16 ENCOUNTER — Telehealth: Payer: Self-pay | Admitting: Oncology

## 2023-06-16 DIAGNOSIS — C9000 Multiple myeloma not having achieved remission: Secondary | ICD-10-CM

## 2023-06-16 MED ORDER — POMALIDOMIDE 3 MG PO CAPS: 3.0000 mg | ORAL_CAPSULE | Freq: Every day | ORAL | 0 refills | Status: DC

## 2023-06-16 NOTE — Telephone Encounter (Signed)
 To loop everyone in on this patient: His girlfriend Nicholas Weiss reports that he has 15 Pomalyst capsules currently on hand. He has already taken his dose today, but he will take one more dose tomorrow 3/20 before taking his week off. Leaving him with 14 capsules His week off is from 3/21-3/27 He will resume his Pomalyst on 3/28 I called [facility] to cancel the rx they have on hand for 21 capsules and resent the rx for 7 capsules since he will have 14 capsules on hand Patient's girlfriend is aware of all of the above After this cycle starting on 3/28, you can resume sending in rxs for the full cycle amount of 21 capsules  I will following up with the [facility] on Friday 3/21 to confirm they are processing the new rx and ensure the patient gets connected with them for delivery.

## 2023-06-16 NOTE — Telephone Encounter (Signed)
 Per MD treatment was deferred by 1 week. I scheduled the appts as per IS and called pt to let him know the change of dates from 3/21 to the new appts 3/28. Pt understood.

## 2023-06-17 ENCOUNTER — Other Ambulatory Visit: Payer: Self-pay

## 2023-06-18 ENCOUNTER — Inpatient Hospital Stay: Payer: 59 | Admitting: Oncology

## 2023-06-18 ENCOUNTER — Inpatient Hospital Stay: Payer: 59

## 2023-06-24 ENCOUNTER — Other Ambulatory Visit: Payer: Self-pay

## 2023-06-25 ENCOUNTER — Inpatient Hospital Stay

## 2023-06-25 ENCOUNTER — Other Ambulatory Visit: Payer: Self-pay

## 2023-06-25 ENCOUNTER — Encounter: Payer: Self-pay | Admitting: Oncology

## 2023-06-25 ENCOUNTER — Inpatient Hospital Stay: Admitting: Oncology

## 2023-06-25 VITALS — BP 136/96 | HR 65 | Temp 96.0°F | Resp 19 | Ht 70.0 in | Wt 150.4 lb

## 2023-06-25 DIAGNOSIS — Z79899 Other long term (current) drug therapy: Secondary | ICD-10-CM | POA: Diagnosis not present

## 2023-06-25 DIAGNOSIS — D702 Other drug-induced agranulocytosis: Secondary | ICD-10-CM | POA: Diagnosis not present

## 2023-06-25 DIAGNOSIS — C9000 Multiple myeloma not having achieved remission: Secondary | ICD-10-CM

## 2023-06-25 DIAGNOSIS — D509 Iron deficiency anemia, unspecified: Secondary | ICD-10-CM | POA: Diagnosis not present

## 2023-06-25 LAB — COMPREHENSIVE METABOLIC PANEL WITH GFR
ALT: 36 U/L (ref 0–44)
AST: 24 U/L (ref 15–41)
Albumin: 3.5 g/dL (ref 3.5–5.0)
Alkaline Phosphatase: 60 U/L (ref 38–126)
Anion gap: 8 (ref 5–15)
BUN: 38 mg/dL — ABNORMAL HIGH (ref 8–23)
CO2: 21 mmol/L — ABNORMAL LOW (ref 22–32)
Calcium: 8.5 mg/dL — ABNORMAL LOW (ref 8.9–10.3)
Chloride: 108 mmol/L (ref 98–111)
Creatinine, Ser: 1.58 mg/dL — ABNORMAL HIGH (ref 0.61–1.24)
GFR, Estimated: 47 mL/min — ABNORMAL LOW (ref >60)
Glucose, Bld: 93 mg/dL (ref 70–99)
Potassium: 3.9 mmol/L (ref 3.5–5.1)
Sodium: 137 mmol/L (ref 135–145)
Total Bilirubin: 0.5 mg/dL (ref 0.0–1.2)
Total Protein: 8 g/dL (ref 6.5–8.1)

## 2023-06-25 LAB — CBC WITH DIFFERENTIAL/PLATELET
Abs Immature Granulocytes: 0 10*3/uL (ref 0.00–0.07)
Basophils Absolute: 0.1 10*3/uL (ref 0.0–0.1)
Basophils Relative: 2 %
Eosinophils Absolute: 0.2 10*3/uL (ref 0.0–0.5)
Eosinophils Relative: 8 %
HCT: 30.2 % — ABNORMAL LOW (ref 39.0–52.0)
Hemoglobin: 9.3 g/dL — ABNORMAL LOW (ref 13.0–17.0)
Immature Granulocytes: 0 %
Lymphocytes Relative: 64 %
Lymphs Abs: 1.5 10*3/uL (ref 0.7–4.0)
MCH: 30.1 pg (ref 26.0–34.0)
MCHC: 30.8 g/dL (ref 30.0–36.0)
MCV: 97.7 fL (ref 80.0–100.0)
Monocytes Absolute: 0.3 10*3/uL (ref 0.1–1.0)
Monocytes Relative: 11 %
Neutro Abs: 0.3 10*3/uL — CL (ref 1.7–7.7)
Neutrophils Relative %: 15 %
Platelets: 251 10*3/uL (ref 150–400)
RBC: 3.09 MIL/uL — ABNORMAL LOW (ref 4.22–5.81)
RDW: 16.5 % — ABNORMAL HIGH (ref 11.5–15.5)
WBC: 2.3 10*3/uL — ABNORMAL LOW (ref 4.0–10.5)
nRBC: 0 % (ref 0.0–0.2)

## 2023-06-25 NOTE — Progress Notes (Signed)
 1121 am - Critical ANC 0.3 called by Selena Batten in [facility]. Read back process performed with lab tech.   Dr. Smith Robert informed of critical value at 1127 am 06/25/23- read back process performed with md.

## 2023-06-25 NOTE — Progress Notes (Signed)
 Hematology/Oncology Consult note [facility]  [phone] [phone]  Patient Care Team: Marisue Ivan, MD as PCP - General (Family Medicine) [place], [facility] (Optometry) Creig Hines, MD as Consulting Physician (Oncology)   Name of the patient: Nicholas Weiss  [id number]  Sep 06, 1950   Date of visit: 06/25/23  Diagnosis-  high risk IgG lambda multiple myeloma R-ISS stage III     Chief complaint/ Reason for visit-on treatment assessment prior to next cycle of Darzalex  Heme/Onc history: Patient is a 73 year old African-American male with a past medical history significant for uncontrolled type 1 diabetes, chronic kidney disease stage III chronic normocytic anemia referred for abnormal SPEP. Patient's creatinine has been fluctuating between 1.5-2 but about 5 months ago it went up all the way to 5 and his blood sugars were in the 1000 range. More recently his kidney numbers have been drifting back to normal values. As a part of the work-up he had serum protein electrophoresis done which showed an elevated gammaglobulin fraction with an M spike of 3%. The amount of M spike was not quantified in the specimen. He has therefore been referred to [place] for further management. Patient endorses chronic fatigue reports that his appetite and weight have remained stable   Results of blood work from 04/08/2020 were as follows: CBC showed white count of 2.9, H&H of 10.5/31.2 with an MCV of 91 and a platelet count of 191.  CMP showed a mildly elevated creatinine of 1.2 which was better as compared to 5 months ago when it was 3.9.  Total protein was mildly elevated at 9.1 calcium normal at 8.4 ferritin and iron studies B12 folate TSH and haptoglobin were normal.  Myeloma panel revealed an elevated IgG level of 06/04/2004 with an M protein of 3.1 g.  Immunofixation showed IgG lambda specificity.  Serum free light chain ratio was elevated at 25 and free light  chain lambda elevated at 370   Further myeloma work-up including a PET CT scan did not reveal any evidence of edematous lesions.  Bone marrow biopsy showed 23% plasma cells by manual count and 30% by CD138 IHC.  Normal cytogenetics.  FISH studies for myeloma showed gain of 1 q.  13 q-. detected.  P53 not detected.   He was treated for a year and has having smoldering multiple myeloma since both CKD and anemia have been stable for 3 to 4 years and could be secondary to uncontrolled diabetes.In June 2023 IgG levels increased to 5463 with an M protein of 4.1 g as compared to 2.6 g in September 2022.  Serum free light chain ratio remains around 25.  Therefore a repeat bone marrow biopsy was done which shows further increase in plasma cells ranging from 30 to 70% and by immunohistochemistry 60 to 70% of the cells were positive for CD138.  Cytogenetics normal.  FISH study showed 4: 14 translocation and gain of 1 q. making this high risk RISS stage III.  Repeat PET scan showed no lytic lesions   Patient is not a transplant candidate and was started on Velcade Revlimid dexamethasone regimen in July 2023.  He was also referred to [facility] for second opinion but did not go.  Treatment complicated by repeated hospitalizations for uncontrolled blood sugars.  He has been frequently hospitalized thereby causing multiple interruptions in his treatment. Due to lack of adequate response to Vrd darzalex was added in may 2024. M protein remained around 2 g despite adding Darzalex  to the regimen and therefore plan to switch to Pomalyst plus Darzalex and stop Velcade and Revlimid altogether starting August 2024     Interval history-he feels well presently and denies any specific complaints at this time.  No recent falls.  States that he started his Pomalyst about a week ago  ECOG PS- 2 Pain scale- 0   Review of systems- Review of Systems  Constitutional:  Positive for malaise/fatigue. Negative for chills, fever and weight  loss.  HENT:  Negative for congestion, ear discharge and nosebleeds.   Eyes:  Negative for blurred vision.  Respiratory:  Negative for cough, hemoptysis, sputum production, shortness of breath and wheezing.   Cardiovascular:  Negative for chest pain, palpitations, orthopnea and claudication.  Gastrointestinal:  Negative for abdominal pain, blood in stool, constipation, diarrhea, heartburn, melena, nausea and vomiting.  Genitourinary:  Negative for dysuria, flank pain, frequency, hematuria and urgency.  Musculoskeletal:  Negative for back pain, joint pain and myalgias.  Skin:  Negative for rash.  Neurological:  Negative for dizziness, tingling, focal weakness, seizures, weakness and headaches.  Endo/Heme/Allergies:  Does not bruise/bleed easily.  Psychiatric/Behavioral:  Negative for depression and suicidal ideas. The patient does not have insomnia.       Allergies  Allergen Reactions   Penicillins Other (See Comments)    Childhood allergy -tolerated amoxil 03/2022 Unknown reaction Has patient had a PCN reaction causing immediate rash, facial/tongue/throat swelling, SOB or lightheadedness with hypotension: No Has patient had a PCN reaction causing severe rash involving mucus membranes or skin necrosis: No Has patient had a PCN reaction that required hospitalization No Has patient had a PCN reaction occurring within the last 10 years: No If all of the above answers are "NO", then may proceed with Cephalosporin use.      Past Medical History:  Diagnosis Date   Acute metabolic encephalopathy 12/02/2020   DKA (diabetic ketoacidoses) 04/06/2016   Hypercholesteremia    Hypertension      Past Surgical History:  Procedure Laterality Date   AMPUTATION TOE Left 04/25/2022   Procedure: LEFT GREAT TOE AMPUTATION AND LEFT PARTIAL 2ND TOE AMPUTATION;  Surgeon: Felecia Shelling, DPM;  Location: [facility] ORS;  Service: Podiatry;  Laterality: Left;   COLONOSCOPY WITH PROPOFOL N/A 02/01/2020    Procedure: COLONOSCOPY WITH PROPOFOL;  Surgeon: Toney Reil, MD;  Location: [facility] ENDOSCOPY;  Service: Gastroenterology;  Laterality: N/A;   ESOPHAGOGASTRODUODENOSCOPY  02/01/2020   Procedure: ESOPHAGOGASTRODUODENOSCOPY (EGD);  Surgeon: Toney Reil, MD;  Location: [facility] ENDOSCOPY;  Service: Gastroenterology;;   ESOPHAGOGASTRODUODENOSCOPY (EGD) WITH PROPOFOL N/A 06/07/2022   Procedure: ESOPHAGOGASTRODUODENOSCOPY (EGD) WITH PROPOFOL;  Surgeon: Kerin Salen, MD;  Location: [facility] ENDOSCOPY;  Service: Gastroenterology;  Laterality: N/A;   KNEE SURGERY Right    Torn meniscus   KNEE SURGERY Left     Social History   Socioeconomic History   Marital status: Single    Spouse name: Not on file   Number of children: 3   Years of education: Not on file   Highest education level: High school graduate  Occupational History    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  Tobacco Use   Smoking status: Never   Smokeless tobacco: Never  Vaping Use   Vaping status: Never Used  Substance and Sexual Activity   Alcohol use: Not Currently    Alcohol/week: 0.0 - 1.0 standard drinks of alcohol    Comment: "once every 2 months"   Drug use: Yes    Types: Marijuana, "Crack" cocaine  Comment: last week   Sexual activity: Not Currently    Birth control/protection: None  Other Topics Concern   Not on file  Social History Narrative   Lives with girlfriend "sharon"   Social Drivers of Health   Financial Resource Strain: Low Risk  (05/19/2023)   Received from [facility]   Overall Financial Resource Strain (CARDIA)    Difficulty of Paying Living Expenses: Not hard at all  Food Insecurity: No Food Insecurity (05/19/2023)   Received from [facility]   Hunger Vital Sign    Worried About Running Out of Food in the Last Year: Never true    Ran Out of Food in the Last Year: Never true  Transportation Needs: No Transportation Needs (05/19/2023)   Received from [facility] - Transportation    In the past 12 months, has lack of transportation kept you from medical appointments or from getting medications?: No    Lack of Transportation (Non-Medical): No  Physical Activity: Insufficiently Active (01/01/2020)   Exercise Vital Sign    Days of Exercise per Week: 7 days    Minutes of Exercise per Session: 20 min  Stress: No Stress Concern Present (01/01/2020)   [employer] of Occupational Health - Occupational Stress Questionnaire    Feeling of Stress : Not at all  Social Connections: Moderately Isolated (03/30/2023)   Social Connection and Isolation Panel [NHANES]    Frequency of Communication with Friends and Family: More than three times a week    Frequency of Social Gatherings with Friends and Family: More than three times a week    Attends Religious Services: Never    Database administrator or Organizations: No    Attends Banker Meetings: Never    Marital Status: Living with partner  Intimate Partner Violence: Patient Unable To Answer (03/27/2023)   Humiliation, Afraid, Rape, and Kick questionnaire    Fear of Current or Ex-Partner: Patient unable to answer    Emotionally Abused: Patient unable to answer    Physically Abused: Patient unable to answer    Sexually Abused: Patient unable to answer    Family History  Problem Relation Age of Onset   Heart attack Father    Hypertension Sister    Cancer Sister      Current Outpatient Medications:    acetaminophen (TYLENOL) 500 MG tablet, Take 1,000 mg by mouth daily as needed for mild pain or moderate pain., Disp: , Rfl:    apixaban (ELIQUIS) 5 MG TABS tablet, Take 1 tablet (5 mg total) by mouth 2 (two) times daily. Start Eliquis after 2 weeks which will be 03/02/2023, Disp: , Rfl:    ascorbic acid (VITAMIN C) 500 MG tablet, Take 1 tablet (500 mg total) by mouth daily., Disp: 90 tablet, Rfl: 0   atorvastatin (LIPITOR) 80 MG tablet, Take 1 tablet (80 mg total) by  mouth at bedtime. Hold while taking Paxlovid, Disp: 30 tablet, Rfl: 2   bisacodyl 5 MG EC tablet, Take 1 tablet (5 mg total) by mouth at bedtime. Skip the dose if no constipation, Disp: , Rfl:    Continuous Glucose Sensor (DEXCOM G7 SENSOR) MISC, 1 each by Does not apply route 4 (four) times daily -  before meals and at bedtime., Disp: 1 each, Rfl: 0   fludrocortisone (FLORINEF) 0.1 MG tablet, Take 1 tablet (0.1 mg total) by mouth daily. Skip the dose if SBP >130, Disp: , Rfl:  gabapentin (NEURONTIN) 300 MG capsule, Take 300 mg by mouth 2 (two) times daily., Disp: , Rfl:    hydrALAZINE (APRESOLINE) 25 MG tablet, Take 1 tablet (25 mg total) by mouth 3 (three) times daily as needed (SBP >160)., Disp: , Rfl:    Insulin Aspart FlexPen (NOVOLOG) 100 UNIT/ML, Sliding Scale: Glucose 70 - 120: 0 units Glucose 121 - 150: 1 unit Glucose 151 - 200: 2 units Glucose 201 - 250: 3 units Glucose 251 - 300: 5 units Glucose 301 - 350: 7 units Glucose 351 - 400: 9 units Glucose > 400: call your docotr, Disp: , Rfl:    insulin degludec (TRESIBA FLEXTOUCH) 100 UNIT/ML FlexTouch Pen, Inject 5 Units into the skin daily. Home med., Disp: , Rfl:    insulin lispro (HUMALOG) 100 UNIT/ML KwikPen, Inject up to 50 units daily as directed, Disp: , Rfl:    iron polysaccharides (NIFEREX) 150 MG capsule, Take 1 capsule (150 mg total) by mouth daily., Disp: , Rfl:    levETIRAcetam (KEPPRA) 500 MG tablet, Take 1 tablet (500 mg total) by mouth 2 (two) times daily., Disp: 60 tablet, Rfl: 2   Multiple Vitamin (MULTIVITAMIN WITH MINERALS) TABS tablet, Take 1 tablet by mouth daily., Disp: 90 tablet, Rfl: 1   NOVOLOG 100 UNIT/ML injection, Sliding Scale: Glucose 70 - 120: 0 units  Glucose 121 - 150: 1 unit  Glucose 151 - 200: 2 units Glucose 201 - 250: 3 units  Glucose 251 - 300: 5 units  Glucose 301 - 350: 7 units  Glucose 351 - 400: 9 units  Glucose > 400: call your docotr, Disp: 10 mL, Rfl: 1   ondansetron (ZOFRAN) 8 MG tablet, Take 8 mg  by mouth daily as needed for nausea or vomiting., Disp: , Rfl:    pantoprazole (PROTONIX) 40 MG tablet, Take 40 mg by mouth daily., Disp: , Rfl:    polyethylene glycol (MIRALAX) 17 g packet, Take 17 g by mouth 2 (two) times daily. Skip the dose if no constipation, Disp: , Rfl:    pomalidomide (POMALYST) 3 MG capsule, Take 1 capsule (3 mg total) by mouth daily. Take for 21 days, the hold for 7 days. Repeat every 28 days., Disp: 7 capsule, Rfl: 0   traZODone (DESYREL) 50 MG tablet, Take 0.5 tablets (25 mg total) by mouth at bedtime as needed for sleep., Disp: , Rfl:   Physical exam:  Vitals:   06/25/23 1123  BP: (!) 136/96  Pulse: 65  Resp: 19  Temp: (!) 96 F (35.6 C)  TempSrc: Tympanic  SpO2: 100%  Weight: 150 lb 6.4 oz (68.2 kg)  Height: 5\' 10"  (1.778 m)   Physical Exam Cardiovascular:     Rate and Rhythm: Normal rate and regular rhythm.     Heart sounds: Normal heart sounds.  Pulmonary:     Effort: Pulmonary effort is normal.     Breath sounds: Normal breath sounds.  Abdominal:     General: Bowel sounds are normal.     Palpations: Abdomen is soft.  Skin:    General: Skin is warm and dry.  Neurological:     Mental Status: He is alert and oriented to person, place, and time.         Latest Ref Rng & Units 06/25/2023   10:44 AM  CMP  Glucose 70 - 99 mg/dL 93   BUN 8 - 23 mg/dL 38   Creatinine 7.84 - 1.24 mg/dL 6.96   Sodium 295 - 284 mmol/L 137  Potassium 3.5 - 5.1 mmol/L 3.9   Chloride 98 - 111 mmol/L 108   CO2 22 - 32 mmol/L 21   Calcium 8.9 - 10.3 mg/dL 8.5   Total Protein 6.5 - 8.1 g/dL 8.0   Total Bilirubin 0.0 - 1.2 mg/dL 0.5   Alkaline Phos 38 - 126 U/L 60   AST 15 - 41 U/L 24   ALT 0 - 44 U/L 36       Latest Ref Rng & Units 06/25/2023   10:44 AM  CBC  WBC 4.0 - 10.5 K/uL 2.3   Hemoglobin 13.0 - 17.0 g/dL 9.3   Hematocrit 16.1 - 52.0 % 30.2   Platelets 150 - 400 K/uL 251     No images are attached to the encounter.  No results  found.   Assessment and plan- Patient is a 73 y.o. male with history of IgG lambda multiple myeloma R-ISS stage III .  He is here for on treatment assessment prior to next cycle of Darzalex  White cell count is low at 2.3 today with an ANC of 0.3.  I have asked him to hold off on taking Pomalyst and I am holding off on Darzalex as well.  We will defer treatment out by 2 weeks.  If ANC is more than 1 at that time he can resume Pomalyst and if it continue to encounter problems with neutropenia I will ask him to lower the dose of Pomalyst from 3 mg to 2 mg.  Myeloma panel and serum free light chains are currently pending.Marland Kitchen  Neutropenic precautions reviewed  Continue aspirin prophylaxis.  Patient did not have any active bone lesions noted on PET scan therefore he is not on any bisphosphonates.  Normocytic anemia: Likely also secondary to CKD.  He will be receiving EPO every month and I will give him the next dose in 2 weeks.  He also has a component of iron deficiency anemia and has received 5 doses of Venofer between February and March 2025.   Visit Diagnosis 1. Multiple myeloma not having achieved remission (HCC)   2. Drug-induced neutropenia (HCC)      Dr. Owens Shark, MD, MPH [facility] at [facility] [phone] 06/25/2023 1:15 PM

## 2023-06-26 ENCOUNTER — Other Ambulatory Visit: Payer: Self-pay

## 2023-06-28 LAB — MULTIPLE MYELOMA PANEL, SERUM
Albumin SerPl Elph-Mcnc: 3.4 g/dL (ref 2.9–4.4)
Albumin/Glob SerPl: 0.9 (ref 0.7–1.7)
Alpha 1: 0.2 g/dL (ref 0.0–0.4)
Alpha2 Glob SerPl Elph-Mcnc: 0.6 g/dL (ref 0.4–1.0)
B-Globulin SerPl Elph-Mcnc: 0.6 g/dL — ABNORMAL LOW (ref 0.7–1.3)
Gamma Glob SerPl Elph-Mcnc: 2.7 g/dL — ABNORMAL HIGH (ref 0.4–1.8)
Globulin, Total: 4.1 g/dL — ABNORMAL HIGH (ref 2.2–3.9)
IgA: 15 mg/dL — ABNORMAL LOW (ref 61–437)
IgG (Immunoglobin G), Serum: 3423 mg/dL — ABNORMAL HIGH (ref 603–1613)
IgM (Immunoglobulin M), Srm: 6 mg/dL — ABNORMAL LOW (ref 20–172)
M Protein SerPl Elph-Mcnc: 2.5 g/dL — ABNORMAL HIGH
Total Protein ELP: 7.5 g/dL (ref 6.0–8.5)

## 2023-06-28 LAB — KAPPA/LAMBDA LIGHT CHAINS
Kappa free light chain: 6.6 mg/L (ref 3.3–19.4)
Kappa, lambda light chain ratio: 0.03 — ABNORMAL LOW (ref 0.26–1.65)
Lambda free light chains: 207.4 mg/L — ABNORMAL HIGH (ref 5.7–26.3)

## 2023-06-30 ENCOUNTER — Other Ambulatory Visit: Payer: Self-pay

## 2023-07-01 ENCOUNTER — Encounter: Payer: Self-pay | Admitting: Oncology

## 2023-07-03 ENCOUNTER — Other Ambulatory Visit: Payer: Self-pay | Admitting: Internal Medicine

## 2023-07-05 ENCOUNTER — Other Ambulatory Visit: Payer: Self-pay | Admitting: Oncology

## 2023-07-05 ENCOUNTER — Telehealth: Payer: Self-pay | Admitting: *Deleted

## 2023-07-05 ENCOUNTER — Encounter: Payer: Self-pay | Admitting: Oncology

## 2023-07-05 DIAGNOSIS — C9000 Multiple myeloma not having achieved remission: Secondary | ICD-10-CM

## 2023-07-05 NOTE — Telephone Encounter (Signed)
 Pomalyst to start on 4/11 after we see him

## 2023-07-05 NOTE — Telephone Encounter (Signed)
 Nicholas Weiss called back to me and she said that his last pill was 3/28 and if he had 7 days off  and he should have start back on 4/7. That is the today and sherry will start today for the pomalyst.

## 2023-07-05 NOTE — Telephone Encounter (Signed)
 [name] called to say when does he needs to start the pomalyst. I called to see if he has the pills, and how long ago has he had 7 days. I had to leave message to get the answers.

## 2023-07-06 ENCOUNTER — Other Ambulatory Visit: Payer: Self-pay

## 2023-07-06 NOTE — Telephone Encounter (Signed)
**  Correction** Cordelia Pen spoke to caregiver [name] who confirmed the patient would restart Pomalyst 07/05/23.

## 2023-07-09 ENCOUNTER — Encounter: Payer: Self-pay | Admitting: Nurse Practitioner

## 2023-07-09 ENCOUNTER — Inpatient Hospital Stay: Attending: Oncology

## 2023-07-09 ENCOUNTER — Inpatient Hospital Stay: Admitting: Nurse Practitioner

## 2023-07-09 ENCOUNTER — Inpatient Hospital Stay

## 2023-07-09 ENCOUNTER — Other Ambulatory Visit: Payer: Self-pay | Admitting: Pharmacist

## 2023-07-09 VITALS — BP 91/62 | HR 70 | Temp 97.5°F | Resp 16 | Wt 148.0 lb

## 2023-07-09 DIAGNOSIS — D649 Anemia, unspecified: Secondary | ICD-10-CM | POA: Diagnosis not present

## 2023-07-09 DIAGNOSIS — C9000 Multiple myeloma not having achieved remission: Secondary | ICD-10-CM | POA: Diagnosis present

## 2023-07-09 DIAGNOSIS — N1831 Chronic kidney disease, stage 3a: Secondary | ICD-10-CM | POA: Diagnosis present

## 2023-07-09 DIAGNOSIS — D631 Anemia in chronic kidney disease: Secondary | ICD-10-CM | POA: Insufficient documentation

## 2023-07-09 DIAGNOSIS — Z79899 Other long term (current) drug therapy: Secondary | ICD-10-CM | POA: Diagnosis not present

## 2023-07-09 DIAGNOSIS — D702 Other drug-induced agranulocytosis: Secondary | ICD-10-CM | POA: Diagnosis not present

## 2023-07-09 DIAGNOSIS — N183 Chronic kidney disease, stage 3 unspecified: Secondary | ICD-10-CM

## 2023-07-09 LAB — COMPREHENSIVE METABOLIC PANEL WITH GFR
ALT: 35 U/L (ref 0–44)
AST: 23 U/L (ref 15–41)
Albumin: 3.7 g/dL (ref 3.5–5.0)
Alkaline Phosphatase: 73 U/L (ref 38–126)
Anion gap: 10 (ref 5–15)
BUN: 41 mg/dL — ABNORMAL HIGH (ref 8–23)
CO2: 23 mmol/L (ref 22–32)
Calcium: 8.9 mg/dL (ref 8.9–10.3)
Chloride: 104 mmol/L (ref 98–111)
Creatinine, Ser: 1.89 mg/dL — ABNORMAL HIGH (ref 0.61–1.24)
GFR, Estimated: 38 mL/min — ABNORMAL LOW (ref >60)
Glucose, Bld: 167 mg/dL — ABNORMAL HIGH (ref 70–99)
Potassium: 3.6 mmol/L (ref 3.5–5.1)
Sodium: 137 mmol/L (ref 135–145)
Total Bilirubin: 0.7 mg/dL (ref 0.0–1.2)
Total Protein: 8.2 g/dL — ABNORMAL HIGH (ref 6.5–8.1)

## 2023-07-09 LAB — CBC WITH DIFFERENTIAL/PLATELET
Abs Immature Granulocytes: 0 10*3/uL (ref 0.00–0.07)
Basophils Absolute: 0 10*3/uL (ref 0.0–0.1)
Basophils Relative: 1 %
Eosinophils Absolute: 0.1 10*3/uL (ref 0.0–0.5)
Eosinophils Relative: 6 %
HCT: 29.4 % — ABNORMAL LOW (ref 39.0–52.0)
Hemoglobin: 9.4 g/dL — ABNORMAL LOW (ref 13.0–17.0)
Immature Granulocytes: 0 %
Lymphocytes Relative: 44 %
Lymphs Abs: 0.8 10*3/uL (ref 0.7–4.0)
MCH: 30.6 pg (ref 26.0–34.0)
MCHC: 32 g/dL (ref 30.0–36.0)
MCV: 95.8 fL (ref 80.0–100.0)
Monocytes Absolute: 0.1 10*3/uL (ref 0.1–1.0)
Monocytes Relative: 5 %
Neutro Abs: 0.9 10*3/uL — ABNORMAL LOW (ref 1.7–7.7)
Neutrophils Relative %: 44 %
Platelets: 208 10*3/uL (ref 150–400)
RBC: 3.07 MIL/uL — ABNORMAL LOW (ref 4.22–5.81)
RDW: 15.9 % — ABNORMAL HIGH (ref 11.5–15.5)
WBC: 1.9 10*3/uL — ABNORMAL LOW (ref 4.0–10.5)
nRBC: 0 % (ref 0.0–0.2)

## 2023-07-09 MED ORDER — EPOETIN ALFA-EPBX 40000 UNIT/ML IJ SOLN
40000.0000 [IU] | INTRAMUSCULAR | Status: AC
  Administered 2023-07-09: 40000 [IU] via SUBCUTANEOUS
  Filled 2023-07-09: qty 1

## 2023-07-09 MED ORDER — POMALIDOMIDE 2 MG PO CAPS: 2.0000 mg | ORAL_CAPSULE | Freq: Every day | ORAL | 0 refills | Status: DC

## 2023-07-09 NOTE — Progress Notes (Signed)
 Hematology/Oncology Consult Note [facility]  [phone] [phone]  Patient Care Team: Marisue Ivan, MD as PCP - General (Family Medicine) Pa, [facility] (Optometry) Creig Hines, MD as Consulting Physician (Oncology)   Name of the patient: Nicholas Weiss  [id number]  1950-06-26   Date of visit: 07/09/23  Diagnosis-  high risk IgG lambda multiple myeloma R-ISS stage III     Chief complaint/ Reason for visit-on treatment assessment prior to next cycle of Darzalex  Heme/Onc history: Patient is a 73 year old African-American male with a past medical history significant for uncontrolled type 1 diabetes, chronic kidney disease stage III chronic normocytic anemia referred for abnormal SPEP. Patient's creatinine has been fluctuating between 1.5-2 but about 5 months ago it went up all the way to 5 and his blood sugars were in the 1000 range. More recently his kidney numbers have been drifting back to normal values. As a part of the work-up he had serum protein electrophoresis done which showed an elevated gammaglobulin fraction with an M spike of 3%. The amount of M spike was not quantified in the specimen. He has therefore been referred to [place] for further management. Patient endorses chronic fatigue reports that his appetite and weight have remained stable   Results of blood work from 04/08/2020 were as follows: CBC showed white count of 2.9, H&H of 10.5/31.2 with an MCV of 91 and a platelet count of 191.  CMP showed a mildly elevated creatinine of 1.2 which was better as compared to 5 months ago when it was 3.9.  Total protein was mildly elevated at 9.1 calcium normal at 8.4 ferritin and iron studies B12 folate TSH and haptoglobin were normal.  Myeloma panel revealed an elevated IgG level of 06/04/2004 with an M protein of 3.1 g.  Immunofixation showed IgG lambda specificity.  Serum free light chain ratio was elevated at 25 and free light chain  lambda elevated at 370   Further myeloma work-up including a PET CT scan did not reveal any evidence of edematous lesions.  Bone marrow biopsy showed 23% plasma cells by manual count and 30% by CD138 IHC.  Normal cytogenetics.  FISH studies for myeloma showed gain of 1 q.  13 q-. detected.  P53 not detected.   He was treated for a year and has having smoldering multiple myeloma since both CKD and anemia have been stable for 3 to 4 years and could be secondary to uncontrolled diabetes.In June 2023 IgG levels increased to 5463 with an M protein of 4.1 g as compared to 2.6 g in September 2022.  Serum free light chain ratio remains around 25.  Therefore a repeat bone marrow biopsy was done which shows further increase in plasma cells ranging from 30 to 70% and by immunohistochemistry 60 to 70% of the cells were positive for CD138.  Cytogenetics normal.  FISH study showed 4: 14 translocation and gain of 1 q. making this high risk RISS stage III.  Repeat PET scan showed no lytic lesions   Patient is not a transplant candidate and was started on Velcade Revlimid dexamethasone regimen in July 2023.  He was also referred to [facility] for second opinion but did not go.  Treatment complicated by repeated hospitalizations for uncontrolled blood sugars.  He has been frequently hospitalized thereby causing multiple interruptions in his treatment. Due to lack of adequate response to Vrd darzalex was added in may 2024. M protein remained around 2 g despite adding Darzalex to the  regimen and therefore plan to switch to Pomalyst plus Darzalex and stop Velcade and Revlimid altogether starting August 2024     Interval history- Continues to feel at baseline and denies complaints. No interval infections or fevers. Has been holding pomalyst.   ECOG PS- 2 Pain scale- 0   Review of systems- Review of Systems  Constitutional:  Positive for malaise/fatigue. Negative for chills, fever and weight loss.  HENT:  Negative for  congestion, ear discharge and nosebleeds.   Eyes:  Negative for blurred vision.  Respiratory:  Negative for cough, hemoptysis, sputum production, shortness of breath and wheezing.   Cardiovascular:  Negative for chest pain, palpitations, orthopnea and claudication.  Gastrointestinal:  Negative for abdominal pain, blood in stool, constipation, diarrhea, heartburn, melena, nausea and vomiting.  Genitourinary:  Negative for dysuria, flank pain, frequency, hematuria and urgency.  Musculoskeletal:  Negative for back pain, joint pain and myalgias.  Skin:  Negative for rash.  Neurological:  Negative for dizziness, tingling, focal weakness, seizures, weakness and headaches.  Endo/Heme/Allergies:  Does not bruise/bleed easily.  Psychiatric/Behavioral:  Negative for depression and suicidal ideas. The patient does not have insomnia.      Allergies  Allergen Reactions   Penicillins Other (See Comments)    Childhood allergy -tolerated amoxil 03/2022 Unknown reaction Has patient had a PCN reaction causing immediate rash, facial/tongue/throat swelling, SOB or lightheadedness with hypotension: No Has patient had a PCN reaction causing severe rash involving mucus membranes or skin necrosis: No Has patient had a PCN reaction that required hospitalization No Has patient had a PCN reaction occurring within the last 10 years: No If all of the above answers are "NO", then may proceed with Cephalosporin use.    Past Medical History:  Diagnosis Date   Acute metabolic encephalopathy 12/02/2020   DKA (diabetic ketoacidoses) 04/06/2016   Hypercholesteremia    Hypertension    Past Surgical History:  Procedure Laterality Date   AMPUTATION TOE Left 04/25/2022   Procedure: LEFT GREAT TOE AMPUTATION AND LEFT PARTIAL 2ND TOE AMPUTATION;  Surgeon: Felecia Shelling, DPM;  Location: [facility] ORS;  Service: Podiatry;  Laterality: Left;   COLONOSCOPY WITH PROPOFOL N/A 02/01/2020   Procedure: COLONOSCOPY WITH PROPOFOL;   Surgeon: Toney Reil, MD;  Location: [facility] ENDOSCOPY;  Service: Gastroenterology;  Laterality: N/A;   ESOPHAGOGASTRODUODENOSCOPY  02/01/2020   Procedure: ESOPHAGOGASTRODUODENOSCOPY (EGD);  Surgeon: Toney Reil, MD;  Location: [facility] ENDOSCOPY;  Service: Gastroenterology;;   ESOPHAGOGASTRODUODENOSCOPY (EGD) WITH PROPOFOL N/A 06/07/2022   Procedure: ESOPHAGOGASTRODUODENOSCOPY (EGD) WITH PROPOFOL;  Surgeon: Kerin Salen, MD;  Location: [facility] ENDOSCOPY;  Service: Gastroenterology;  Laterality: N/A;   KNEE SURGERY Right    Torn meniscus   KNEE SURGERY Left    Social History   Socioeconomic History   Marital status: Single    Spouse name: Not on file   Number of children: 3   Years of education: Not on file   Highest education level: High school graduate  Occupational History    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  Tobacco Use   Smoking status: Never   Smokeless tobacco: Never  Vaping Use   Vaping status: Never Used  Substance and Sexual Activity   Alcohol use: Not Currently    Alcohol/week: 0.0 - 1.0 standard drinks of alcohol    Comment: "once every 2 months"   Drug use: Yes    Types: Marijuana, "Crack" cocaine    Comment: last week   Sexual activity: Not Currently    Birth control/protection: None  Other Topics Concern   Not on file  Social History Narrative   Lives with girlfriend "sharon"   Social Drivers of Health   Financial Resource Strain: Low Risk  (05/19/2023)   Received from [facility]   Overall Financial Resource Strain (CARDIA)    Difficulty of Paying Living Expenses: Not hard at all  Food Insecurity: No Food Insecurity (05/19/2023)   Received from [facility]   Hunger Vital Sign    Worried About Running Out of Food in the Last Year: Never true    Ran Out of Food in the Last Year: Never true  Transportation Needs: No Transportation Needs (05/19/2023)   Received [facility] - [facility]     In the past 12 months, has lack of transportation kept you from medical appointments or from getting medications?: No    Lack of Transportation (Non-Medical): No  Physical Activity: Insufficiently Active (01/01/2020)   Exercise Vital Sign    Days of Exercise per Week: 7 days    Minutes of Exercise per Session: 20 min  Stress: No Stress Concern Present (01/01/2020)   [employer] of Occupational Health - Occupational Stress Questionnaire    Feeling of Stress : Not at all  Social Connections: Moderately Isolated (03/30/2023)   Social Connection and Isolation Panel [NHANES]    Frequency of Communication with Friends and Family: More than three times a week    Frequency of Social Gatherings with Friends and Family: More than three times a week    Attends Religious Services: Never    Database administrator or Organizations: No    Attends Banker Meetings: Never    Marital Status: Living with partner  Intimate Partner Violence: Patient Unable To Answer (03/27/2023)   Humiliation, Afraid, Rape, and Kick questionnaire    Fear of Current or Ex-Partner: Patient unable to answer    Emotionally Abused: Patient unable to answer    Physically Abused: Patient unable to answer    Sexually Abused: Patient unable to answer   Family History  Problem Relation Age of Onset   Heart attack Father    Hypertension Sister    Cancer Sister    Current Outpatient Medications:    acetaminophen (TYLENOL) 500 MG tablet, Take 1,000 mg by mouth daily as needed for mild pain or moderate pain., Disp: , Rfl:    apixaban (ELIQUIS) 5 MG TABS tablet, Take 1 tablet (5 mg total) by mouth 2 (two) times daily. Start Eliquis after 2 weeks which will be 03/02/2023, Disp: , Rfl:    ascorbic acid (VITAMIN C) 500 MG tablet, Take 1 tablet (500 mg total) by mouth daily., Disp: 90 tablet, Rfl: 0   atorvastatin (LIPITOR) 80 MG tablet, Take 1 tablet (80 mg total) by mouth at bedtime. Hold while taking Paxlovid, Disp:  30 tablet, Rfl: 2   bisacodyl 5 MG EC tablet, Take 1 tablet (5 mg total) by mouth at bedtime. Skip the dose if no constipation, Disp: , Rfl:    Continuous Glucose Sensor (DEXCOM G7 SENSOR) MISC, 1 each by Does not apply route 4 (four) times daily -  before meals and at bedtime., Disp: 1 each, Rfl: 0   fludrocortisone (FLORINEF) 0.1 MG tablet, Take 1 tablet (0.1 mg total) by mouth daily. Skip the dose if SBP >130, Disp: , Rfl:    gabapentin (NEURONTIN) 300 MG capsule, Take 300 mg by mouth 2 (two) times daily., Disp: , Rfl:  hydrALAZINE (APRESOLINE) 25 MG tablet, Take 1 tablet (25 mg total) by mouth 3 (three) times daily as needed (SBP >160)., Disp: , Rfl:    Insulin Aspart FlexPen (NOVOLOG) 100 UNIT/ML, Sliding Scale: Glucose 70 - 120: 0 units Glucose 121 - 150: 1 unit Glucose 151 - 200: 2 units Glucose 201 - 250: 3 units Glucose 251 - 300: 5 units Glucose 301 - 350: 7 units Glucose 351 - 400: 9 units Glucose > 400: call your docotr, Disp: , Rfl:    insulin degludec (TRESIBA FLEXTOUCH) 100 UNIT/ML FlexTouch Pen, Inject 5 Units into the skin daily. Home med., Disp: , Rfl:    insulin lispro (HUMALOG) 100 UNIT/ML KwikPen, Inject up to 50 units daily as directed, Disp: , Rfl:    levETIRAcetam (KEPPRA) 500 MG tablet, Take 1 tablet by mouth twice daily, Disp: 60 tablet, Rfl: 0   Multiple Vitamin (MULTIVITAMIN WITH MINERALS) TABS tablet, Take 1 tablet by mouth daily., Disp: 90 tablet, Rfl: 1   NOVOLOG 100 UNIT/ML injection, Sliding Scale: Glucose 70 - 120: 0 units  Glucose 121 - 150: 1 unit  Glucose 151 - 200: 2 units Glucose 201 - 250: 3 units  Glucose 251 - 300: 5 units  Glucose 301 - 350: 7 units  Glucose 351 - 400: 9 units  Glucose > 400: call your docotr, Disp: 10 mL, Rfl: 1   ondansetron (ZOFRAN) 8 MG tablet, Take 8 mg by mouth daily as needed for nausea or vomiting., Disp: , Rfl:    pantoprazole (PROTONIX) 40 MG tablet, Take 40 mg by mouth daily., Disp: , Rfl:    polyethylene glycol (MIRALAX) 17 g  packet, Take 17 g by mouth 2 (two) times daily. Skip the dose if no constipation, Disp: , Rfl:    traZODone (DESYREL) 50 MG tablet, Take 0.5 tablets (25 mg total) by mouth at bedtime as needed for sleep., Disp: , Rfl:    iron polysaccharides (NIFEREX) 150 MG capsule, Take 1 capsule (150 mg total) by mouth daily., Disp: , Rfl:    pomalidomide (POMALYST) 2 MG capsule, Take 1 capsule (2 mg total) by mouth daily. Take for 21 days, the hold for 7 days. Repeat every 28 days., Disp: 21 capsule, Rfl: 0 No current facility-administered medications for this visit.  Facility-Administered Medications Ordered in Other Visits:    epoetin alfa-epbx (RETACRIT) injection 40,000 Units, 40,000 Units, Subcutaneous, Q21 days, Creig Hines, MD, 40,000 Units at 07/09/23 1153  Physical exam:  Vitals:   07/09/23 1115  BP: 91/62  Pulse: 70  Resp: 16  Temp: (!) 97.5 F (36.4 C)  TempSrc: Tympanic  SpO2: 100%  Weight: 148 lb (67.1 kg)   Physical Exam Vitals reviewed.  Constitutional:      Appearance: He is not ill-appearing.  Cardiovascular:     Rate and Rhythm: Normal rate and regular rhythm.  Pulmonary:     Effort: Pulmonary effort is normal.  Abdominal:     Palpations: Abdomen is soft.  Skin:    General: Skin is warm and dry.     Coloration: Skin is not pale.  Neurological:     Mental Status: He is alert and oriented to person, place, and time. Mental status is at baseline.  Psychiatric:        Mood and Affect: Mood normal.        Latest Ref Rng & Units 07/09/2023   11:04 AM  CMP  Glucose 70 - 99 mg/dL 025   BUN 8 - 23  mg/dL 41   Creatinine 1.61 - 1.24 mg/dL 0.96   Sodium 045 - 409 mmol/L 137   Potassium 3.5 - 5.1 mmol/L 3.6   Chloride 98 - 111 mmol/L 104   CO2 22 - 32 mmol/L 23   Calcium 8.9 - 10.3 mg/dL 8.9   Total Protein 6.5 - 8.1 g/dL 8.2   Total Bilirubin 0.0 - 1.2 mg/dL 0.7   Alkaline Phos 38 - 126 U/L 73   AST 15 - 41 U/L 23   ALT 0 - 44 U/L 35    CBC    Component Value  Date/Time   WBC 1.9 (L) 07/09/2023 1104   RBC 3.07 (L) 07/09/2023 1104   HGB 9.4 (L) 07/09/2023 1104   HGB 7.9 (L) 04/26/2023 1301   HGB 11.2 (L) 10/03/2020 1317   HCT 29.4 (L) 07/09/2023 1104   HCT 33.1 (L) 10/03/2020 1317   PLT 208 07/09/2023 1104   PLT 245 04/26/2023 1301   PLT 202 10/03/2020 1317   MCV 95.8 07/09/2023 1104   MCV 89 10/03/2020 1317   MCV 91 10/11/2012 0542   MCH 30.6 07/09/2023 1104   MCHC 32.0 07/09/2023 1104   RDW 15.9 (H) 07/09/2023 1104   RDW 13.7 10/03/2020 1317   RDW 13.1 10/11/2012 0542   LYMPHSABS 0.8 07/09/2023 1104   LYMPHSABS 0.7 (L) 10/11/2012 0542   MONOABS 0.1 07/09/2023 1104   MONOABS 0.3 10/11/2012 0542   EOSABS 0.1 07/09/2023 1104   EOSABS 0.0 10/11/2012 0542   BASOSABS 0.0 07/09/2023 1104   BASOSABS 0.0 10/11/2012 0542   No results found.  Assessment and plan- Patient is a 73 y.o. male with history of IgG lambda multiple myeloma R-ISS stage III .  He is here for on treatment assessment prior to next cycle of Darzalex.   WBC and ANC have improved with holding pomalyst and darzalex however, anc remains < 1. 0.9 today. Plan to hold darzalex and pomalyst today. We will dose reduce pomalyst to 2 mg. He will return to clinic in one week to recheck bloodwork and consideration of pomalyst and darzalex. Again reviewed neutropenic precautions.   Myeloma panel and serum free light chains are currently pending.  Continue aspirin prophylaxis.  Patient did not have any active bone lesions noted on PET scan therefore he is not on any bisphosphonates.  Normocytic anemia: Likely also secondary to CKD. He receives EPO monthly. Hmg 9.4 today. Proceed with EPO. He also has a component of iron deficiency and received 5 doses of venofer between February 2025 and March 2025.   Disposition:   Epo today Hold darzalex and pomalyst  Visit Diagnosis 1. Multiple myeloma not having achieved remission (HCC)   2. Drug-induced neutropenia (HCC)   3.  Erythropoietin (EPO) stimulating agent anemia management patient     Consuello Masse, DNP, AGNP-C, [facility] [phone] (clinic) 07/09/2023

## 2023-07-09 NOTE — Progress Notes (Signed)
 Per Consuello Masse, NP on 07/09/23: "anc remains < 1 despite holding pomalyst x 2 weeks. Dr Smith Robert plans to drop dose to 2 mg as opposed to 3."

## 2023-07-11 ENCOUNTER — Other Ambulatory Visit: Payer: Self-pay

## 2023-07-11 ENCOUNTER — Emergency Department
Admission: EM | Admit: 2023-07-11 | Discharge: 2023-07-11 | Disposition: A | Discharge status: Home / Self Care | Attending: Emergency Medicine | Admitting: Emergency Medicine

## 2023-07-11 DIAGNOSIS — E1165 Type 2 diabetes mellitus with hyperglycemia: Secondary | ICD-10-CM | POA: Diagnosis not present

## 2023-07-11 DIAGNOSIS — R739 Hyperglycemia, unspecified: Secondary | ICD-10-CM

## 2023-07-11 DIAGNOSIS — E860 Dehydration: Secondary | ICD-10-CM | POA: Diagnosis not present

## 2023-07-11 LAB — COMPREHENSIVE METABOLIC PANEL WITH GFR
ALT: 30 U/L (ref 0–44)
AST: 19 U/L (ref 15–41)
Albumin: 3.2 g/dL — ABNORMAL LOW (ref 3.5–5.0)
Alkaline Phosphatase: 67 U/L (ref 38–126)
Anion gap: 10 (ref 5–15)
BUN: 37 mg/dL — ABNORMAL HIGH (ref 8–23)
CO2: 23 mmol/L (ref 22–32)
Calcium: 8.8 mg/dL — ABNORMAL LOW (ref 8.9–10.3)
Chloride: 102 mmol/L (ref 98–111)
Creatinine, Ser: 1.73 mg/dL — ABNORMAL HIGH (ref 0.61–1.24)
GFR, Estimated: 42 mL/min — ABNORMAL LOW (ref >60)
Glucose, Bld: 423 mg/dL — ABNORMAL HIGH (ref 70–99)
Potassium: 4.1 mmol/L (ref 3.5–5.1)
Sodium: 135 mmol/L (ref 135–145)
Total Bilirubin: 0.6 mg/dL (ref 0.0–1.2)
Total Protein: 7.6 g/dL (ref 6.5–8.1)

## 2023-07-11 LAB — BLOOD GAS, VENOUS
Acid-Base Excess: 2.4 mmol/L — ABNORMAL HIGH (ref 0.0–2.0)
Bicarbonate: 28.8 mmol/L — ABNORMAL HIGH (ref 20.0–28.0)
O2 Saturation: 52.2 %
Patient temperature: 37
pCO2, Ven: 51 mmHg (ref 44–60)
pH, Ven: 7.36 (ref 7.25–7.43)
pO2, Ven: 35 mmHg (ref 32–45)

## 2023-07-11 LAB — CBC
HCT: 26.4 % — ABNORMAL LOW (ref 39.0–52.0)
Hemoglobin: 8.9 g/dL — ABNORMAL LOW (ref 13.0–17.0)
MCH: 31.6 pg (ref 26.0–34.0)
MCHC: 33.7 g/dL (ref 30.0–36.0)
MCV: 93.6 fL (ref 80.0–100.0)
Platelets: 181 10*3/uL (ref 150–400)
RBC: 2.82 MIL/uL — ABNORMAL LOW (ref 4.22–5.81)
RDW: 15.5 % (ref 11.5–15.5)
WBC: 1.8 10*3/uL — ABNORMAL LOW (ref 4.0–10.5)
nRBC: 0 % (ref 0.0–0.2)

## 2023-07-11 LAB — BETA-HYDROXYBUTYRIC ACID: Beta-Hydroxybutyric Acid: 0.13 mmol/L (ref 0.05–0.27)

## 2023-07-11 LAB — CBG MONITORING, ED
Glucose-Capillary: 305 mg/dL — ABNORMAL HIGH (ref 70–99)
Glucose-Capillary: 240 mg/dL — ABNORMAL HIGH (ref 70–99)

## 2023-07-11 MED ORDER — INSULIN ASPART 100 UNIT/ML IJ SOLN
6.0000 [IU] | Freq: Once | INTRAMUSCULAR | Status: AC
  Administered 2023-07-11: 6 [IU] via INTRAVENOUS

## 2023-07-11 MED ORDER — INSULIN ASPART 100 UNIT/ML IJ SOLN
10.0000 [IU] | Freq: Once | INTRAMUSCULAR | Status: DC
  Filled 2023-07-11: qty 1

## 2023-07-11 MED ORDER — SODIUM CHLORIDE 0.9 % IV SOLN: Freq: Once | INTRAVENOUS | Status: AC

## 2023-07-11 NOTE — ED Provider Notes (Signed)
 [facility] Provider Note    Event Date/Time   First MD Initiated Contact with Patient 07/11/23 1534     (approximate)   History   Hyperglycemia   HPI  Nicholas Weiss is a 73 y.o. male with history of multiple myeloma who presents with elevated glucose.  Patient reports his glucose has been well-controlled but recently transition to insulin pump and over the last week glucose has been elevated.  He feels fatigued but otherwise has no fever abdominal pain nausea or vomiting     Physical Exam   Triage Vital Signs: ED Triage Vitals  Encounter Vitals Group     BP 07/11/23 1358 113/68     Systolic BP Percentile --      Diastolic BP Percentile --      Pulse Rate 07/11/23 1358 (!) 59     Resp 07/11/23 1358 18     Temp 07/11/23 1358 98 F (36.7 C)     Temp Source 07/11/23 1358 Oral     SpO2 07/11/23 1358 96 %     Weight 07/11/23 1359 67.1 kg (148 lb)     Height 07/11/23 1359 1.778 m (5\' 10" )     Head Circumference --      Peak Flow --      Pain Score 07/11/23 1359 0     Pain Loc --      Pain Education --      Exclude from Growth Chart --     Most recent vital signs: Vitals:   07/11/23 1756 07/11/23 1758  BP: (!) 158/90   Pulse: 63   Resp: 18   Temp:  97.8 F (36.6 C)  SpO2: 100%      General: Awake, no distress.  CV:  Good peripheral perfusion.  Resp:  Normal effort.  Abd:  No distention.  Other:     ED Results / Procedures / Treatments   Labs (all labs ordered are listed, but only abnormal results are displayed) Labs Reviewed  CBC - Abnormal; Notable for the following components:      Result Value   WBC 1.8 (*)    RBC 2.82 (*)    Hemoglobin 8.9 (*)    HCT 26.4 (*)    All other components within normal limits  COMPREHENSIVE METABOLIC PANEL WITH GFR - Abnormal; Notable for the following components:   Glucose, Bld 423 (*)    BUN 37 (*)    Creatinine, Ser 1.73 (*)    Calcium 8.8 (*)    Albumin 3.2 (*)    GFR, Estimated 42  (*)    All other components within normal limits  BLOOD GAS, VENOUS - Abnormal; Notable for the following components:   Bicarbonate 28.8 (*)    Acid-Base Excess 2.4 (*)    All other components within normal limits  CBG MONITORING, ED - Abnormal; Notable for the following components:   Glucose-Capillary 305 (*)    All other components within normal limits  CBG MONITORING, ED - Abnormal; Notable for the following components:   Glucose-Capillary 240 (*)    All other components within normal limits  BETA-HYDROXYBUTYRIC ACID     EKG     RADIOLOGY     PROCEDURES:  Critical Care performed:   Procedures   MEDICATIONS ORDERED IN ED: Medications  0.9 %  sodium chloride infusion (0 mLs Intravenous Stopped 07/11/23 1810)  insulin aspart (novoLOG) injection 6 Units (6 Units Intravenous Given 07/11/23 1657)     IMPRESSION /  MDM / ASSESSMENT AND PLAN / ED COURSE  I reviewed the triage vital signs and the nursing notes. Patient's presentation is most consistent with severe exacerbation of chronic illness.  Patient with a history of diabetes presents with elevated glucose, on CMP glucose is 423, his bicarb is normal, pH is normal, not consistent with DKA.  Will treat with IV fluids, IV insulin and reevaluate.  Patient feeling better after treatment, appropriate for discharge at this time with close follow-up with endocrinologist/PCP to ensure functioning of insulin pump      FINAL CLINICAL IMPRESSION(S) / ED DIAGNOSES   Final diagnoses:  Hyperglycemia  Dehydration     Rx / DC Orders   ED Discharge Orders     None        Note:  This document was prepared using Dragon voice recognition software and may include unintentional dictation errors.   Bryson Carbine, MD 07/11/23 [phone]

## 2023-07-11 NOTE — ED Triage Notes (Signed)
 Pt sts that he has been having " high" BGL for the last week.

## 2023-07-12 ENCOUNTER — Other Ambulatory Visit: Payer: Self-pay | Admitting: Oncology

## 2023-07-12 DIAGNOSIS — C9000 Multiple myeloma not having achieved remission: Secondary | ICD-10-CM

## 2023-07-12 LAB — KAPPA/LAMBDA LIGHT CHAINS
Kappa free light chain: 6.7 mg/L (ref 3.3–19.4)
Kappa, lambda light chain ratio: 0.05 — ABNORMAL LOW (ref 0.26–1.65)
Lambda free light chains: 129.1 mg/L — ABNORMAL HIGH (ref 5.7–26.3)

## 2023-07-12 LAB — MULTIPLE MYELOMA PANEL, SERUM
Albumin SerPl Elph-Mcnc: 3.8 g/dL (ref 2.9–4.4)
Albumin/Glob SerPl: 0.9 (ref 0.7–1.7)
Alpha 1: 0.2 g/dL (ref 0.0–0.4)
Alpha2 Glob SerPl Elph-Mcnc: 0.6 g/dL (ref 0.4–1.0)
B-Globulin SerPl Elph-Mcnc: 0.6 g/dL — ABNORMAL LOW (ref 0.7–1.3)
Gamma Glob SerPl Elph-Mcnc: 2.9 g/dL — ABNORMAL HIGH (ref 0.4–1.8)
Globulin, Total: 4.3 g/dL — ABNORMAL HIGH (ref 2.2–3.9)
IgA: 14 mg/dL — ABNORMAL LOW (ref 61–437)
IgG (Immunoglobin G), Serum: 4147 mg/dL — ABNORMAL HIGH (ref 603–1613)
IgM (Immunoglobulin M), Srm: <5 mg/dL — ABNORMAL LOW (ref 20–172)
M Protein SerPl Elph-Mcnc: 2.7 g/dL — ABNORMAL HIGH
Total Protein ELP: 8.1 g/dL (ref 6.0–8.5)

## 2023-07-14 ENCOUNTER — Encounter: Payer: Self-pay | Admitting: Oncology

## 2023-07-16 ENCOUNTER — Inpatient Hospital Stay

## 2023-07-16 ENCOUNTER — Inpatient Hospital Stay (HOSPITAL_BASED_OUTPATIENT_CLINIC_OR_DEPARTMENT_OTHER): Admitting: Oncology

## 2023-07-16 ENCOUNTER — Encounter: Payer: Self-pay | Admitting: Oncology

## 2023-07-16 VITALS — BP 80/62 | HR 72 | Temp 97.0°F | Resp 18 | Ht 70.0 in | Wt 138.0 lb

## 2023-07-16 DIAGNOSIS — C9000 Multiple myeloma not having achieved remission: Secondary | ICD-10-CM

## 2023-07-16 DIAGNOSIS — Z79899 Other long term (current) drug therapy: Secondary | ICD-10-CM | POA: Diagnosis not present

## 2023-07-16 DIAGNOSIS — N1831 Chronic kidney disease, stage 3a: Secondary | ICD-10-CM | POA: Diagnosis not present

## 2023-07-16 DIAGNOSIS — Z5111 Encounter for antineoplastic chemotherapy: Secondary | ICD-10-CM

## 2023-07-16 LAB — COMPREHENSIVE METABOLIC PANEL WITH GFR
ALT: 23 U/L (ref 0–44)
AST: 16 U/L (ref 15–41)
Albumin: 3.6 g/dL (ref 3.5–5.0)
Alkaline Phosphatase: 69 U/L (ref 38–126)
Anion gap: 8 (ref 5–15)
BUN: 31 mg/dL — ABNORMAL HIGH (ref 8–23)
CO2: 27 mmol/L (ref 22–32)
Calcium: 8.8 mg/dL — ABNORMAL LOW (ref 8.9–10.3)
Chloride: 98 mmol/L (ref 98–111)
Creatinine, Ser: 1.97 mg/dL — ABNORMAL HIGH (ref 0.61–1.24)
GFR, Estimated: 36 mL/min — ABNORMAL LOW (ref >60)
Glucose, Bld: 295 mg/dL — ABNORMAL HIGH (ref 70–99)
Potassium: 4 mmol/L (ref 3.5–5.1)
Sodium: 133 mmol/L — ABNORMAL LOW (ref 135–145)
Total Bilirubin: 0.6 mg/dL (ref 0.0–1.2)
Total Protein: 8.1 g/dL (ref 6.5–8.1)

## 2023-07-16 LAB — CBC WITH DIFFERENTIAL/PLATELET
Abs Immature Granulocytes: 0.01 10*3/uL (ref 0.00–0.07)
Basophils Absolute: 0 10*3/uL (ref 0.0–0.1)
Basophils Relative: 1 %
Eosinophils Absolute: 0.1 10*3/uL (ref 0.0–0.5)
Eosinophils Relative: 4 %
HCT: 31.6 % — ABNORMAL LOW (ref 39.0–52.0)
Hemoglobin: 10.2 g/dL — ABNORMAL LOW (ref 13.0–17.0)
Immature Granulocytes: 0 %
Lymphocytes Relative: 43 %
Lymphs Abs: 1.3 10*3/uL (ref 0.7–4.0)
MCH: 31 pg (ref 26.0–34.0)
MCHC: 32.3 g/dL (ref 30.0–36.0)
MCV: 96 fL (ref 80.0–100.0)
Monocytes Absolute: 0.2 10*3/uL (ref 0.1–1.0)
Monocytes Relative: 7 %
Neutro Abs: 1.4 10*3/uL — ABNORMAL LOW (ref 1.7–7.7)
Neutrophils Relative %: 45 %
Platelets: 250 10*3/uL (ref 150–400)
RBC: 3.29 MIL/uL — ABNORMAL LOW (ref 4.22–5.81)
RDW: 15.8 % — ABNORMAL HIGH (ref 11.5–15.5)
WBC: 3 10*3/uL — ABNORMAL LOW (ref 4.0–10.5)
nRBC: 0 % (ref 0.0–0.2)

## 2023-07-16 MED ORDER — DEXAMETHASONE 4 MG PO TABS
10.0000 mg | ORAL_TABLET | Freq: Once | ORAL | Status: AC
  Administered 2023-07-16: 10 mg via ORAL
  Filled 2023-07-16: qty 3

## 2023-07-16 MED ORDER — SODIUM CHLORIDE 0.9 % IV SOLN
Freq: Once | INTRAVENOUS | Status: AC
Start: 2023-07-16 — End: 2023-07-16
  Filled 2023-07-16: qty 250

## 2023-07-16 MED ORDER — DARATUMUMAB-HYALURONIDASE-FIHJ 1800-30000 MG-UT/15ML ~~LOC~~ SOLN
1800.0000 mg | Freq: Once | SUBCUTANEOUS | Status: AC
  Administered 2023-07-16: 1800 mg via SUBCUTANEOUS
  Filled 2023-07-16: qty 15

## 2023-07-16 MED ORDER — DIPHENHYDRAMINE HCL 25 MG PO CAPS
50.0000 mg | ORAL_CAPSULE | Freq: Once | ORAL | Status: AC
  Administered 2023-07-16: 50 mg via ORAL
  Filled 2023-07-16: qty 2

## 2023-07-16 MED ORDER — ACETAMINOPHEN 325 MG PO TABS
650.0000 mg | ORAL_TABLET | Freq: Once | ORAL | Status: AC
  Administered 2023-07-16: 650 mg via ORAL
  Filled 2023-07-16: qty 2

## 2023-07-16 NOTE — Patient Instructions (Signed)
 [facility] - A DEPT OF [facility]  Discharge Instructions: Thank you for choosing [facility] to provide your oncology and hematology care.  If you have a lab appointment with the Cancer Center, please go directly to the Cancer Center and check in at the registration area.  Wear comfortable clothing and clothing appropriate for easy access to any Portacath or PICC line.   We strive to give you quality time with your provider. You may need to reschedule your appointment if you arrive late (15 or more minutes).  Arriving late affects you and other patients whose appointments are after yours.  Also, if you miss three or more appointments without notifying the office, you may be dismissed from the clinic at the provider's discretion.      For prescription refill requests, have your pharmacy contact our office and allow 72 hours for refills to be completed.    Today you received the following chemotherapy and/or immunotherapy agents Darzalex      To help prevent nausea and vomiting after your treatment, we encourage you to take your nausea medication as directed.  BELOW ARE SYMPTOMS THAT SHOULD BE REPORTED IMMEDIATELY: *FEVER GREATER THAN 100.4 F (38 C) OR HIGHER *CHILLS OR SWEATING *NAUSEA AND VOMITING THAT IS NOT CONTROLLED WITH YOUR NAUSEA MEDICATION *UNUSUAL SHORTNESS OF BREATH *UNUSUAL BRUISING OR BLEEDING *URINARY PROBLEMS (pain or burning when urinating, or frequent urination) *BOWEL PROBLEMS (unusual diarrhea, constipation, pain near the anus) TENDERNESS IN MOUTH AND THROAT WITH OR WITHOUT PRESENCE OF ULCERS (sore throat, sores in mouth, or a toothache) UNUSUAL RASH, SWELLING OR PAIN  UNUSUAL VAGINAL DISCHARGE OR ITCHING   Items with * indicate a potential emergency and should be followed up as soon as possible or go to the Emergency Department if any problems should occur.  Please show the CHEMOTHERAPY ALERT CARD or IMMUNOTHERAPY  ALERT CARD at check-in to the Emergency Department and triage nurse.  Should you have questions after your visit or need to cancel or reschedule your appointment, please contact [facility] - A DEPT OF [facility]  [phone] and follow the prompts.  Office hours are 8:00 a.m. to 4:30 p.m. Monday - Friday. Please note that voicemails left after 4:00 p.m. may not be returned until the following business day.  We are closed weekends and major holidays. You have access to a nurse at all times for urgent questions. Please call the main number to the clinic [phone] and follow the prompts.  For any non-urgent questions, you may also contact your provider using MyChart. We now offer e-Visits for anyone 68 and older to request care online for non-urgent symptoms. For details visit [email or web address].   Also download the MyChart app! Go to the app store, search "MyChart", open the app, select [place], and log in with your MyChart username and password.

## 2023-07-18 ENCOUNTER — Encounter: Payer: Self-pay | Admitting: Oncology

## 2023-07-18 NOTE — Progress Notes (Signed)
 Hematology/Oncology Consult note [facility]  [phone] [phone]  Patient Care Team: Monique Ano, MD as PCP - General (Family Medicine) [place], [facility] (Optometry) Avonne Boettcher, MD as Consulting Physician (Oncology)   Name of the patient: Nicholas Weiss  [id number]  11-11-1950   Date of visit: 07/18/23  Diagnosis- high risk IgG lambda multiple myeloma R-ISS stage III   Chief complaint/ Reason for visit- on treatment assessment prior to cycle 8 of darzalex   Heme/Onc history: Patient is a 73 year old African-American male with a past medical history significant for uncontrolled type 1 diabetes, chronic kidney disease stage III chronic normocytic anemia referred for abnormal SPEP. Patient's creatinine has been fluctuating between 1.5-2 but about 5 months ago it went up all the way to 5 and his blood sugars were in the 1000 range. More recently his kidney numbers have been drifting back to normal values. As a part of the work-up he had serum protein electrophoresis done which showed an elevated gammaglobulin fraction with an M spike of 3%. The amount of M spike was not quantified in the specimen. He has therefore been referred to us  for further management. Patient endorses chronic fatigue reports that his appetite and weight have remained stable   Results of blood work from 04/08/2020 were as follows: CBC showed white count of 2.9, H&H of 10.5/31.2 with an MCV of 91 and a platelet count of 191.  CMP showed a mildly elevated creatinine of 1.2 which was better as compared to 5 months ago when it was 3.9.  Total protein was mildly elevated at 9.1 calcium  normal at 8.4 ferritin and iron  studies B12 folate TSH and haptoglobin were normal.  Myeloma panel revealed an elevated IgG level of 06/04/2004 with an M protein of 3.1 g.  Immunofixation showed IgG lambda specificity.  Serum free light chain ratio was elevated at 25 and free light chain  lambda elevated at 370   Further myeloma work-up including a PET CT scan did not reveal any evidence of edematous lesions.  Bone marrow biopsy showed 23% plasma cells by manual count and 30% by CD138 IHC.  Normal cytogenetics.  FISH studies for myeloma showed gain of 1 q.  13 q-. detected.  P53 not detected.   He was treated for a year and has having smoldering multiple myeloma since both CKD and anemia have been stable for 3 to 4 years and could be secondary to uncontrolled diabetes.In June 2023 IgG levels increased to 5463 with an M protein of 4.1 g as compared to 2.6 g in September 2022.  Serum free light chain ratio remains around 25.  Therefore a repeat bone marrow biopsy was done which shows further increase in plasma cells ranging from 30 to 70% and by immunohistochemistry 60 to 70% of the cells were positive for CD138.  Cytogenetics normal.  FISH study showed 4: 14 translocation and gain of 1 q. making this high risk RISS stage III.  Repeat PET scan showed no lytic lesions   Patient is not a transplant candidate and was started on Velcade  Revlimid  dexamethasone  regimen in July 2023.  He was also referred to [facility] for second opinion but did not go.  Treatment complicated by repeated hospitalizations for uncontrolled blood sugars.  He has been frequently hospitalized thereby causing multiple interruptions in his treatment. Due to lack of adequate response to Vrd darzalex  was added in may 2024. M protein remained around 2 g despite adding Darzalex  to the  regimen and therefore plan to switch to Pomalyst  plus Darzalex  and stop Velcade  and Revlimid  altogether starting August 2024    Interval history- Patient had a fall 5 days ago and when was in the ER.  ECOG PS- 2 Pain scale- 0   Review of systems- Review of Systems  Constitutional:  Positive for malaise/fatigue. Negative for chills, fever and weight loss.  HENT:  Negative for congestion, ear discharge and nosebleeds.   Eyes:  Negative for blurred  vision.  Respiratory:  Negative for cough, hemoptysis, sputum production, shortness of breath and wheezing.   Cardiovascular:  Negative for chest pain, palpitations, orthopnea and claudication.  Gastrointestinal:  Negative for abdominal pain, blood in stool, constipation, diarrhea, heartburn, melena, nausea and vomiting.  Genitourinary:  Negative for dysuria, flank pain, frequency, hematuria and urgency.  Musculoskeletal:  Negative for back pain, joint pain and myalgias.  Skin:  Negative for rash.  Neurological:  Negative for dizziness, tingling, focal weakness, seizures, weakness and headaches.  Endo/Heme/Allergies:  Does not bruise/bleed easily.  Psychiatric/Behavioral:  Negative for depression and suicidal ideas. The patient does not have insomnia.       Allergies  Allergen Reactions   Penicillins Other (See Comments)    Childhood allergy -tolerated amoxil  03/2022 Unknown reaction Has patient had a PCN reaction causing immediate rash, facial/tongue/throat swelling, SOB or lightheadedness with hypotension: No Has patient had a PCN reaction causing severe rash involving mucus membranes or skin necrosis: No Has patient had a PCN reaction that required hospitalization No Has patient had a PCN reaction occurring within the last 10 years: No If all of the above answers are "NO", then may proceed with Cephalosporin use.      Past Medical History:  Diagnosis Date   Acute metabolic encephalopathy 12/02/2020   DKA (diabetic ketoacidoses) 04/06/2016   Hypercholesteremia    Hypertension      Past Surgical History:  Procedure Laterality Date   AMPUTATION TOE Left 04/25/2022   Procedure: LEFT GREAT TOE AMPUTATION AND LEFT PARTIAL 2ND TOE AMPUTATION;  Surgeon: Dot Gazella, DPM;  Location: [facility] ORS;  Service: Podiatry;  Laterality: Left;   COLONOSCOPY WITH PROPOFOL  N/A 02/01/2020   Procedure: COLONOSCOPY WITH PROPOFOL ;  Surgeon: Selena Daily, MD;  Location: [facility] ENDOSCOPY;   Service: Gastroenterology;  Laterality: N/A;   ESOPHAGOGASTRODUODENOSCOPY  02/01/2020   Procedure: ESOPHAGOGASTRODUODENOSCOPY (EGD);  Surgeon: Selena Daily, MD;  Location: [facility] ENDOSCOPY;  Service: Gastroenterology;;   ESOPHAGOGASTRODUODENOSCOPY (EGD) WITH PROPOFOL  N/A 06/07/2022   Procedure: ESOPHAGOGASTRODUODENOSCOPY (EGD) WITH PROPOFOL ;  Surgeon: Genell Ken, MD;  Location: [facility] ENDOSCOPY;  Service: Gastroenterology;  Laterality: N/A;   KNEE SURGERY Right    Torn meniscus   KNEE SURGERY Left     Social History   Socioeconomic History   Marital status: Single    Spouse name: Not on file   Number of children: 3   Years of education: Not on file   Highest education level: High school graduate  Occupational History    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  Tobacco Use   Smoking status: Never   Smokeless tobacco: Never  Vaping Use   Vaping status: Never Used  Substance and Sexual Activity   Alcohol use: Not Currently    Alcohol/week: 0.0 - 1.0 standard drinks of alcohol    Comment: "once every 2 months"   Drug use: Yes    Types: Marijuana, "Crack" cocaine    Comment: last week   Sexual activity: Not Currently    Birth control/protection: None  Other Topics Concern   Not on file  Social History Narrative   Lives with girlfriend "sharon"   Social Drivers of Health   Financial Resource Strain: Low Risk  (05/19/2023)   Received from [facility]   Overall Financial Resource Strain (CARDIA)    Difficulty of Paying Living Expenses: Not hard at all  Food Insecurity: No Food Insecurity (05/19/2023)   Received [facility]   Hunger Vital Sign    Worried About Running Out of Food in the Last Year: Never true    Ran Out of Food in the Last Year: Never true  Transportation Needs: No Transportation Needs (05/19/2023)   Received from [facility] - Transportation    In the past 12 months, has lack of transportation kept  you from medical appointments or from getting medications?: No    Lack of Transportation (Non-Medical): No  Physical Activity: Insufficiently Active (01/01/2020)   Exercise Vital Sign    Days of Exercise per Week: 7 days    Minutes of Exercise per Session: 20 min  Stress: No Stress Concern Present (01/01/2020)   [employer] of Occupational Health - Occupational Stress Questionnaire    Feeling of Stress : Not at all  Social Connections: Moderately Isolated (03/30/2023)   Social Connection and Isolation Panel [NHANES]    Frequency of Communication with Friends and Family: More than three times a week    Frequency of Social Gatherings with Friends and Family: More than three times a week    Attends Religious Services: Never    Database administrator or Organizations: No    Attends Banker Meetings: Never    Marital Status: Living with partner  Intimate Partner Violence: Patient Unable To Answer (03/27/2023)   Humiliation, Afraid, Rape, and Kick questionnaire    Fear of Current or Ex-Partner: Patient unable to answer    Emotionally Abused: Patient unable to answer    Physically Abused: Patient unable to answer    Sexually Abused: Patient unable to answer    Family History  Problem Relation Age of Onset   Heart attack Father    Hypertension Sister    Cancer Sister      Current Outpatient Medications:    acetaminophen  (TYLENOL ) 500 MG tablet, Take 1,000 mg by mouth daily as needed for mild pain or moderate pain., Disp: , Rfl:    apixaban  (ELIQUIS ) 5 MG TABS tablet, Take 1 tablet (5 mg total) by mouth 2 (two) times daily. Start Eliquis  after 2 weeks which will be 03/02/2023, Disp: , Rfl:    ascorbic acid  (VITAMIN C ) 500 MG tablet, Take 1 tablet (500 mg total) by mouth daily., Disp: 90 tablet, Rfl: 0   atorvastatin  (LIPITOR ) 80 MG tablet, Take 1 tablet (80 mg total) by mouth at bedtime. Hold while taking Paxlovid , Disp: 30 tablet, Rfl: 2   bisacodyl  5 MG EC tablet, Take  1 tablet (5 mg total) by mouth at bedtime. Skip the dose if no constipation, Disp: , Rfl:    Continuous Glucose Sensor (DEXCOM G7 SENSOR) MISC, 1 each by Does not apply route 4 (four) times daily -  before meals and at bedtime., Disp: 1 each, Rfl: 0   fludrocortisone  (FLORINEF ) 0.1 MG tablet, Take 1 tablet (0.1 mg total) by mouth daily. Skip the dose if SBP >130, Disp: , Rfl:    gabapentin  (NEURONTIN ) 300 MG capsule, Take 300 mg by mouth 2 (two) times daily., Disp: ,  Rfl:    hydrALAZINE  (APRESOLINE ) 25 MG tablet, Take 1 tablet (25 mg total) by mouth 3 (three) times daily as needed (SBP >160)., Disp: , Rfl:    Insulin  Aspart FlexPen (NOVOLOG ) 100 UNIT/ML, Sliding Scale: Glucose 70 - 120: 0 units Glucose 121 - 150: 1 unit Glucose 151 - 200: 2 units Glucose 201 - 250: 3 units Glucose 251 - 300: 5 units Glucose 301 - 350: 7 units Glucose 351 - 400: 9 units Glucose > 400: call your docotr, Disp: , Rfl:    insulin  degludec (TRESIBA  FLEXTOUCH) 100 UNIT/ML FlexTouch Pen, Inject 5 Units into the skin daily. Home med., Disp: , Rfl:    insulin  lispro (HUMALOG) 100 UNIT/ML KwikPen, Inject up to 50 units daily as directed, Disp: , Rfl:    iron  polysaccharides (NIFEREX) 150 MG capsule, Take 1 capsule (150 mg total) by mouth daily., Disp: , Rfl:    levETIRAcetam  (KEPPRA ) 500 MG tablet, Take 1 tablet by mouth twice daily, Disp: 60 tablet, Rfl: 0   Multiple Vitamin (MULTIVITAMIN WITH MINERALS) TABS tablet, Take 1 tablet by mouth daily., Disp: 90 tablet, Rfl: 1   NOVOLOG  100 UNIT/ML injection, Sliding Scale: Glucose 70 - 120: 0 units  Glucose 121 - 150: 1 unit  Glucose 151 - 200: 2 units Glucose 201 - 250: 3 units  Glucose 251 - 300: 5 units  Glucose 301 - 350: 7 units  Glucose 351 - 400: 9 units  Glucose > 400: call your docotr, Disp: 10 mL, Rfl: 1   ondansetron  (ZOFRAN ) 8 MG tablet, Take 8 mg by mouth daily as needed for nausea or vomiting., Disp: , Rfl:    pantoprazole  (PROTONIX ) 40 MG tablet, Take 40 mg by mouth  daily., Disp: , Rfl:    polyethylene glycol (MIRALAX ) 17 g packet, Take 17 g by mouth 2 (two) times daily. Skip the dose if no constipation, Disp: , Rfl:    pomalidomide  (POMALYST ) 2 MG capsule, Take 1 capsule (2 mg total) by mouth daily. Take for 21 days, the hold for 7 days. Repeat every 28 days. (Patient not taking: Reported on 07/16/2023), Disp: 21 capsule, Rfl: 0   traZODone  (DESYREL ) 50 MG tablet, Take 0.5 tablets (25 mg total) by mouth at bedtime as needed for sleep., Disp: , Rfl:  No current facility-administered medications for this visit.  Facility-Administered Medications Ordered in Other Visits:    epoetin  alfa-epbx (RETACRIT ) injection 40,000 Units, 40,000 Units, Subcutaneous, Q21 days, Avonne Boettcher, MD, 40,000 Units at 07/09/23 1153  Physical exam:  Vitals:   07/16/23 1212  BP: (!) 80/62  Pulse: 72  Resp: 18  Temp: (!) 97 F (36.1 C)  TempSrc: Tympanic  SpO2: 100%  Weight: 138 lb (62.6 kg)  Height: 5\' 10"  (1.778 m)   Physical Exam Cardiovascular:     Rate and Rhythm: Normal rate and regular rhythm.     Heart sounds: Normal heart sounds.  Pulmonary:     Effort: Pulmonary effort is normal.     Breath sounds: Normal breath sounds.  Abdominal:     General: Bowel sounds are normal.     Palpations: Abdomen is soft.  Skin:    General: Skin is warm and dry.  Neurological:     Mental Status: He is alert and oriented to person, place, and time.      I have personally reviewed labs listed below:    Latest Ref Rng & Units 07/16/2023   11:39 AM  CMP  Glucose 70 -  99 mg/dL 161   BUN 8 - 23 mg/dL 31   Creatinine 0.96 - 1.24 mg/dL 0.45   Sodium 409 - 811 mmol/L 133   Potassium 3.5 - 5.1 mmol/L 4.0   Chloride 98 - 111 mmol/L 98   CO2 22 - 32 mmol/L 27   Calcium  8.9 - 10.3 mg/dL 8.8   Total Protein 6.5 - 8.1 g/dL 8.1   Total Bilirubin 0.0 - 1.2 mg/dL 0.6   Alkaline Phos 38 - 126 U/L 69   AST 15 - 41 U/L 16   ALT 0 - 44 U/L 23       Latest Ref Rng & Units  07/16/2023   11:39 AM  CBC  WBC 4.0 - 10.5 K/uL 3.0   Hemoglobin 13.0 - 17.0 g/dL 91.4   Hematocrit 78.2 - 52.0 % 31.6   Platelets 150 - 400 K/uL 250     Assessment and plan- Patient is a 73 y.o. male with history of IgG lambda multiple myeloma R-ISS stage III here for on treatment assessment prior to cycle 8 of Darzalex   ANC is more than 1 otherwise okay to proceed With cycle 8 of Darzalex  today he will restart taking Pomalyst  2 mg 3 weeks on and 1 week off today.  I will see him back in 4 weeks for cycle 9.Patient will continue with aspirin  prophylaxis.  In terms of his myeloma response he has not had significant response to his M protein or serum free light chains despite switching his regimen from VRD to Darzalex  and Pomalyst .  I will continue with this regimen for a month or 2 and if there is no discernible response I will consider offering him carfilzomib along with Pomalyst .   Visit Diagnosis 1. Multiple myeloma not having achieved remission (HCC)      Dr. Seretha Dance, MD, MPH [facility] [phone] 07/18/2023 7:39 PM

## 2023-07-19 LAB — KAPPA/LAMBDA LIGHT CHAINS
Kappa free light chain: 6.3 mg/L (ref 3.3–19.4)
Kappa, lambda light chain ratio: 0.04 — ABNORMAL LOW (ref 0.26–1.65)
Lambda free light chains: 176.6 mg/L — ABNORMAL HIGH (ref 5.7–26.3)

## 2023-07-20 LAB — MULTIPLE MYELOMA PANEL, SERUM
Albumin SerPl Elph-Mcnc: 3.6 g/dL (ref 2.9–4.4)
Albumin/Glob SerPl: 0.8 (ref 0.7–1.7)
Alpha 1: 0.2 g/dL (ref 0.0–0.4)
Alpha2 Glob SerPl Elph-Mcnc: 0.6 g/dL (ref 0.4–1.0)
B-Globulin SerPl Elph-Mcnc: 0.6 g/dL — ABNORMAL LOW (ref 0.7–1.3)
Gamma Glob SerPl Elph-Mcnc: 3.2 g/dL — ABNORMAL HIGH (ref 0.4–1.8)
Globulin, Total: 4.7 g/dL — ABNORMAL HIGH (ref 2.2–3.9)
IgA: 15 mg/dL — ABNORMAL LOW (ref 61–437)
IgG (Immunoglobin G), Serum: 4075 mg/dL — ABNORMAL HIGH (ref 603–1613)
IgM (Immunoglobulin M), Srm: <5 mg/dL — ABNORMAL LOW (ref 20–172)
M Protein SerPl Elph-Mcnc: 2.9 g/dL — ABNORMAL HIGH
Total Protein ELP: 8.3 g/dL (ref 6.0–8.5)

## 2023-07-21 ENCOUNTER — Other Ambulatory Visit: Payer: Self-pay

## 2023-07-21 ENCOUNTER — Inpatient Hospital Stay
Admission: EM | Admit: 2023-07-21 | Discharge: 2023-07-29 | DRG: 092 | Disposition: A | Discharge status: Home Health | Attending: Obstetrics and Gynecology | Admitting: Obstetrics and Gynecology

## 2023-07-21 ENCOUNTER — Emergency Department

## 2023-07-21 DIAGNOSIS — W19XXXA Unspecified fall, initial encounter: Principal | ICD-10-CM

## 2023-07-21 DIAGNOSIS — E7800 Pure hypercholesterolemia, unspecified: Secondary | ICD-10-CM | POA: Diagnosis present

## 2023-07-21 DIAGNOSIS — G901 Familial dysautonomia [Riley-Day]: Principal | ICD-10-CM | POA: Diagnosis present

## 2023-07-21 DIAGNOSIS — D631 Anemia in chronic kidney disease: Secondary | ICD-10-CM | POA: Diagnosis present

## 2023-07-21 DIAGNOSIS — Z7952 Long term (current) use of systemic steroids: Secondary | ICD-10-CM

## 2023-07-21 DIAGNOSIS — R9431 Abnormal electrocardiogram [ECG] [EKG]: Secondary | ICD-10-CM | POA: Insufficient documentation

## 2023-07-21 DIAGNOSIS — Z8249 Family history of ischemic heart disease and other diseases of the circulatory system: Secondary | ICD-10-CM

## 2023-07-21 DIAGNOSIS — R55 Syncope and collapse: Secondary | ICD-10-CM | POA: Diagnosis present

## 2023-07-21 DIAGNOSIS — Z79899 Other long term (current) drug therapy: Secondary | ICD-10-CM

## 2023-07-21 DIAGNOSIS — G8929 Other chronic pain: Secondary | ICD-10-CM | POA: Diagnosis present

## 2023-07-21 DIAGNOSIS — E1040 Type 1 diabetes mellitus with diabetic neuropathy, unspecified: Secondary | ICD-10-CM | POA: Diagnosis present

## 2023-07-21 DIAGNOSIS — E10649 Type 1 diabetes mellitus with hypoglycemia without coma: Secondary | ICD-10-CM | POA: Diagnosis present

## 2023-07-21 DIAGNOSIS — Z8673 Personal history of transient ischemic attack (TIA), and cerebral infarction without residual deficits: Secondary | ICD-10-CM

## 2023-07-21 DIAGNOSIS — I951 Orthostatic hypotension: Secondary | ICD-10-CM | POA: Diagnosis not present

## 2023-07-21 DIAGNOSIS — I10 Essential (primary) hypertension: Secondary | ICD-10-CM | POA: Diagnosis present

## 2023-07-21 DIAGNOSIS — R296 Repeated falls: Secondary | ICD-10-CM | POA: Diagnosis present

## 2023-07-21 DIAGNOSIS — Z880 Allergy status to penicillin: Secondary | ICD-10-CM

## 2023-07-21 DIAGNOSIS — D709 Neutropenia, unspecified: Secondary | ICD-10-CM | POA: Diagnosis present

## 2023-07-21 DIAGNOSIS — Z89422 Acquired absence of other left toe(s): Secondary | ICD-10-CM

## 2023-07-21 DIAGNOSIS — Z89412 Acquired absence of left great toe: Secondary | ICD-10-CM

## 2023-07-21 DIAGNOSIS — Z87820 Personal history of traumatic brain injury: Secondary | ICD-10-CM

## 2023-07-21 DIAGNOSIS — R42 Dizziness and giddiness: Secondary | ICD-10-CM

## 2023-07-21 DIAGNOSIS — I959 Hypotension, unspecified: Secondary | ICD-10-CM | POA: Diagnosis present

## 2023-07-21 DIAGNOSIS — I480 Paroxysmal atrial fibrillation: Secondary | ICD-10-CM | POA: Diagnosis present

## 2023-07-21 DIAGNOSIS — G253 Myoclonus: Secondary | ICD-10-CM | POA: Diagnosis present

## 2023-07-21 DIAGNOSIS — Z9181 History of falling: Secondary | ICD-10-CM

## 2023-07-21 DIAGNOSIS — Z7901 Long term (current) use of anticoagulants: Secondary | ICD-10-CM

## 2023-07-21 DIAGNOSIS — I129 Hypertensive chronic kidney disease with stage 1 through stage 4 chronic kidney disease, or unspecified chronic kidney disease: Secondary | ICD-10-CM | POA: Diagnosis present

## 2023-07-21 DIAGNOSIS — R531 Weakness: Secondary | ICD-10-CM | POA: Diagnosis present

## 2023-07-21 DIAGNOSIS — E16A2 Hypoglycemia level 2: Secondary | ICD-10-CM | POA: Diagnosis present

## 2023-07-21 DIAGNOSIS — E1022 Type 1 diabetes mellitus with diabetic chronic kidney disease: Secondary | ICD-10-CM | POA: Diagnosis present

## 2023-07-21 DIAGNOSIS — C9000 Multiple myeloma not having achieved remission: Secondary | ICD-10-CM | POA: Diagnosis present

## 2023-07-21 DIAGNOSIS — N1832 Chronic kidney disease, stage 3b: Secondary | ICD-10-CM | POA: Diagnosis present

## 2023-07-21 DIAGNOSIS — Z794 Long term (current) use of insulin: Secondary | ICD-10-CM

## 2023-07-21 LAB — URINALYSIS, ROUTINE W REFLEX MICROSCOPIC
Bacteria, UA: NONE SEEN
Bilirubin Urine: NEGATIVE
Glucose, UA: >=500 mg/dL — AB
Hgb urine dipstick: NEGATIVE
Ketones, ur: NEGATIVE mg/dL
Leukocytes,Ua: NEGATIVE
Nitrite: NEGATIVE
Protein, ur: 30 mg/dL — AB
Specific Gravity, Urine: 1.017 (ref 1.005–1.030)
Squamous Epithelial / HPF: 0 /HPF (ref 0–5)
pH: 5 (ref 5.0–8.0)

## 2023-07-21 LAB — COMPREHENSIVE METABOLIC PANEL WITH GFR
ALT: 33 U/L (ref 0–44)
AST: 27 U/L (ref 15–41)
Albumin: 3.1 g/dL — ABNORMAL LOW (ref 3.5–5.0)
Alkaline Phosphatase: 50 U/L (ref 38–126)
Anion gap: 6 (ref 5–15)
BUN: 34 mg/dL — ABNORMAL HIGH (ref 8–23)
CO2: 27 mmol/L (ref 22–32)
Calcium: 8.6 mg/dL — ABNORMAL LOW (ref 8.9–10.3)
Chloride: 107 mmol/L (ref 98–111)
Creatinine, Ser: 1.54 mg/dL — ABNORMAL HIGH (ref 0.61–1.24)
GFR, Estimated: 48 mL/min — ABNORMAL LOW (ref >60)
Glucose, Bld: 117 mg/dL — ABNORMAL HIGH (ref 70–99)
Potassium: 3.9 mmol/L (ref 3.5–5.1)
Sodium: 140 mmol/L (ref 135–145)
Total Bilirubin: 0.5 mg/dL (ref 0.0–1.2)
Total Protein: 7.6 g/dL (ref 6.5–8.1)

## 2023-07-21 LAB — CBG MONITORING, ED
Glucose-Capillary: 114 mg/dL — ABNORMAL HIGH (ref 70–99)
Glucose-Capillary: 181 mg/dL — ABNORMAL HIGH (ref 70–99)
Glucose-Capillary: 205 mg/dL — ABNORMAL HIGH (ref 70–99)

## 2023-07-21 LAB — CBC
HCT: 34.1 % — ABNORMAL LOW (ref 39.0–52.0)
Hemoglobin: 10.6 g/dL — ABNORMAL LOW (ref 13.0–17.0)
MCH: 30.9 pg (ref 26.0–34.0)
MCHC: 31.1 g/dL (ref 30.0–36.0)
MCV: 99.4 fL (ref 80.0–100.0)
Platelets: 241 10*3/uL (ref 150–400)
RBC: 3.43 MIL/uL — ABNORMAL LOW (ref 4.22–5.81)
RDW: 16.4 % — ABNORMAL HIGH (ref 11.5–15.5)
WBC: 2.2 10*3/uL — ABNORMAL LOW (ref 4.0–10.5)
nRBC: 0 % (ref 0.0–0.2)

## 2023-07-21 LAB — TROPONIN I (HIGH SENSITIVITY)
Troponin I (High Sensitivity): 13 ng/L (ref <18)
Troponin I (High Sensitivity): 10 ng/L (ref <18)

## 2023-07-21 LAB — PROTIME-INR
INR: 1.3 — ABNORMAL HIGH (ref 0.8–1.2)
Prothrombin Time: 16.4 s — ABNORMAL HIGH (ref 11.4–15.2)

## 2023-07-21 LAB — CK: Total CK: 48 U/L — ABNORMAL LOW (ref 49–397)

## 2023-07-21 MED ORDER — SODIUM CHLORIDE 0.9 % IV BOLUS
500.0000 mL | Freq: Once | INTRAVENOUS | Status: AC
  Administered 2023-07-21: 500 mL via INTRAVENOUS

## 2023-07-21 MED ORDER — ONDANSETRON HCL 4 MG PO TABS: 4.0000 mg | ORAL_TABLET | Freq: Four times a day (QID) | ORAL | Status: DC | PRN

## 2023-07-21 MED ORDER — APIXABAN 5 MG PO TABS
5.0000 mg | ORAL_TABLET | Freq: Two times a day (BID) | ORAL | Status: DC
  Administered 2023-07-21 – 2023-07-29 (×16): 5 mg via ORAL
  Filled 2023-07-21: qty 2
  Filled 2023-07-21 (×15): qty 1

## 2023-07-21 MED ORDER — ACETAMINOPHEN 325 MG PO TABS
650.0000 mg | ORAL_TABLET | Freq: Four times a day (QID) | ORAL | Status: DC | PRN
  Administered 2023-07-22 – 2023-07-29 (×11): 650 mg via ORAL
  Filled 2023-07-21 (×11): qty 2

## 2023-07-21 MED ORDER — ACETAMINOPHEN 650 MG RE SUPP: 650.0000 mg | Freq: Four times a day (QID) | RECTAL | Status: DC | PRN

## 2023-07-21 MED ORDER — INSULIN ASPART 100 UNIT/ML IJ SOLN
0.0000 [IU] | Freq: Three times a day (TID) | INTRAMUSCULAR | Status: DC
  Administered 2023-07-22 (×2): 2 [IU] via SUBCUTANEOUS
  Administered 2023-07-23: 9 [IU] via SUBCUTANEOUS
  Administered 2023-07-23: 2 [IU] via SUBCUTANEOUS
  Administered 2023-07-24 (×2): 3 [IU] via SUBCUTANEOUS
  Administered 2023-07-24 – 2023-07-25 (×2): 1 [IU] via SUBCUTANEOUS
  Administered 2023-07-25 – 2023-07-26 (×2): 2 [IU] via SUBCUTANEOUS
  Administered 2023-07-26: 1 [IU] via SUBCUTANEOUS
  Filled 2023-07-21 (×11): qty 1

## 2023-07-21 MED ORDER — ATORVASTATIN CALCIUM 20 MG PO TABS
80.0000 mg | ORAL_TABLET | Freq: Every day | ORAL | Status: DC
  Administered 2023-07-21 – 2023-07-28 (×8): 80 mg via ORAL
  Filled 2023-07-21 (×8): qty 4

## 2023-07-21 MED ORDER — INSULIN GLARGINE-YFGN 100 UNIT/ML ~~LOC~~ SOLN
5.0000 [IU] | Freq: Every day | SUBCUTANEOUS | Status: DC
  Administered 2023-07-22 – 2023-07-23 (×2): 5 [IU] via SUBCUTANEOUS
  Filled 2023-07-21 (×2): qty 0.05

## 2023-07-21 MED ORDER — PANTOPRAZOLE SODIUM 40 MG PO TBEC
40.0000 mg | DELAYED_RELEASE_TABLET | Freq: Every day | ORAL | Status: DC
  Administered 2023-07-22 – 2023-07-29 (×8): 40 mg via ORAL
  Filled 2023-07-21 (×8): qty 1

## 2023-07-21 MED ORDER — FLUDROCORTISONE ACETATE 0.1 MG PO TABS
0.1000 mg | ORAL_TABLET | Freq: Every day | ORAL | Status: DC
  Administered 2023-07-22 – 2023-07-24 (×3): 0.1 mg via ORAL
  Filled 2023-07-21 (×5): qty 1

## 2023-07-21 MED ORDER — ONDANSETRON HCL 4 MG/2ML IJ SOLN: 4.0000 mg | Freq: Four times a day (QID) | INTRAMUSCULAR | Status: DC | PRN

## 2023-07-21 MED ORDER — INSULIN ASPART 100 UNIT/ML IJ SOLN
0.0000 [IU] | Freq: Every day | INTRAMUSCULAR | Status: DC
  Administered 2023-07-21: 2 [IU] via SUBCUTANEOUS
  Administered 2023-07-23: 3 [IU] via SUBCUTANEOUS
  Administered 2023-07-25: 4 [IU] via SUBCUTANEOUS
  Administered 2023-07-26: 1 [IU] via SUBCUTANEOUS
  Filled 2023-07-21 (×4): qty 1

## 2023-07-21 MED ORDER — SODIUM CHLORIDE 0.9% FLUSH
3.0000 mL | Freq: Two times a day (BID) | INTRAVENOUS | Status: DC
  Administered 2023-07-21 – 2023-07-29 (×16): 3 mL via INTRAVENOUS

## 2023-07-21 MED ORDER — LEVETIRACETAM 500 MG PO TABS
500.0000 mg | ORAL_TABLET | Freq: Two times a day (BID) | ORAL | Status: DC
  Administered 2023-07-21 – 2023-07-29 (×16): 500 mg via ORAL
  Filled 2023-07-21 (×17): qty 1

## 2023-07-21 NOTE — ED Notes (Signed)
 Report given to [facility]

## 2023-07-21 NOTE — ED Notes (Signed)
 CCMD called to place pt on cardiac monitor per order

## 2023-07-21 NOTE — ED Notes (Signed)
 Pt taken to CT.

## 2023-07-21 NOTE — ED Notes (Addendum)
Post void residual 96 mL

## 2023-07-21 NOTE — ED Provider Notes (Addendum)
 [facility] Provider Note    Event Date/Time   First MD Initiated Contact with Patient 07/21/23 [phone]     (approximate)   History   Fall   HPI  Nicholas Weiss is a 73 y.o. male with history of multiple myeloma who comes in with concerns for a fall noted to be hypotensive.  Patient reports having multiple falls.  He reports just chronic pain in his legs and that his legs just give out and he falls over.  Does report hitting his head.  Denies any chest pain, shortness of breath or other concerns.  According to both patient and wife he has had a syncopal episode.  He is not really sure if he feels dizzy or lightheaded before the falls.  He attributes that more just to his legs being weak and secondary to pain.  He denies any new back pain, weakness in 1 leg, numbness, urinary or bladder incontinence.  This increase in falls and been going on for the past few weeks and family state that they are having a hard time taking care of him and that they often cannot get him up off the floor.  Family do report that he also seems a little bit more confused than normal.   Physical Exam   Triage Vital Signs: ED Triage Vitals  Encounter Vitals Group     BP 07/21/23 1637 (!) 84/53     Systolic BP Percentile --      Diastolic BP Percentile --      Pulse Rate 07/21/23 1637 72     Resp 07/21/23 1637 18     Temp 07/21/23 1637 98.1 F (36.7 C)     Temp Source 07/21/23 1637 Oral     SpO2 07/21/23 1637 100 %     Weight 07/21/23 1638 158 lb (71.7 kg)     Height 07/21/23 1638 5' 10.5" (1.791 m)     Head Circumference --      Peak Flow --      Pain Score 07/21/23 1638 0     Pain Loc --      Pain Education --      Exclude from Growth Chart --     Most recent vital signs: Vitals:   07/21/23 1637  BP: (!) 84/53  Pulse: 72  Resp: 18  Temp: 98.1 F (36.7 C)  SpO2: 100%     General: Awake, no distress.  CV:  Good peripheral perfusion.  Resp:  Normal effort.  Abd:  No  distention.  Other:  No chest wall tenderness no abdominal tenderness able to lift both legs up off the bed good distal pulses no arm tenderness able to lift both arms up off the bed.  No obvious hematoma to the head   ED Results / Procedures / Treatments   Labs (all labs ordered are listed, but only abnormal results are displayed) Labs Reviewed  CBG MONITORING, ED - Abnormal; Notable for the following components:      Result Value   Glucose-Capillary 114 (*)    All other components within normal limits  CBC  URINALYSIS, ROUTINE W REFLEX MICROSCOPIC  COMPREHENSIVE METABOLIC PANEL WITH GFR  PROTIME-INR  CBG MONITORING, ED  TROPONIN I (HIGH SENSITIVITY)     EKG  My interpretation of EKG:  Type I AV block sinus rate of 73 without any ST elevation or T wave inversions, prolonged QTc.  Reviewed prior EKG and he had similar findings.  RADIOLOGY I have  reviewed the xray personally and interpreted and no ICH    PROCEDURES:  Critical Care performed: No  .1-3 Lead EKG Interpretation  Performed by: Lubertha Rush, MD Authorized by: Lubertha Rush, MD     Interpretation: normal     ECG rate:  60   ECG rate assessment: normal     Rhythm: sinus rhythm     Ectopy: none     Conduction: normal      MEDICATIONS ORDERED IN ED: Medications  sodium chloride  0.9 % bolus 500 mL (has no administration in time range)     IMPRESSION / MDM / ASSESSMENT AND PLAN / ED COURSE  I reviewed the triage vital signs and the nursing notes.   Patient's presentation is most consistent with acute presentation with potential threat to life or bodily function.   Patient comes in with concerns for recurrent falls in the setting of known multiple myeloma.  Workup was done to evaluate for rhabdo, Electra abnormalities, AKI, ACS, UTI.  No focal weakness, numbness, new back pain to suggest cauda equina, cord compression.  There is concern concern from family of some increasing confusion as well as  syncopal episode.  Troponins are negative x 2.  White count low similar to prior.  Hemoglobin is stable.  CMP shows elevated creatinine around baseline.  Glucose is reassuring.  CK negative   MPRESSION: 1. No acute intracranial abnormality or significant interval change. 2. Periventricular and subcortical white matter changes are mildly advanced for age, similar the prior study. This likely reflects the sequela of chronic microvascular ischemia.    IMPRESSION: 1. No acute fracture or traumatic subluxation. 2. Multilevel degenerative changes of the cervical spine as described. 3. Left greater than right proximal ICA stenosis.    Given the above stenosis and potentially some syncopal they may be beneficial to admit patient for workup of carotid stenosis and see if this could be contributing to patient's weakness  PVR is 96  does have positive orthostatics. Getting IV fluid.  I do not see any evidence of cord compression based upon examination.  Discussed with family and they are very concerned about patient going home and stated that last time they were just given fluids and went home and he continued to have multiple falls.  Will discuss hospital team for admission due to the above  The patient is on the cardiac monitor to evaluate for evidence of arrhythmia and/or significant heart rate changes.      FINAL CLINICAL IMPRESSION(S) / ED DIAGNOSES   Final diagnoses:  Fall, initial encounter  Orthostatic hypotension  Multiple myeloma, remission status unspecified (HCC)     Rx / DC Orders   ED Discharge Orders     None        Note:  This document was prepared using Dragon voice recognition software and may include unintentional dictation errors.   Lubertha Rush, MD 07/21/23 2014    Lubertha Rush, MD 07/21/23 [phone]

## 2023-07-21 NOTE — ED Notes (Signed)
 Orthostatic VS:   Supine: 172/95 63HR Sitting: 180/94 62HR Standing: 112/78 65HR  Pt denies lightheadedness/dizziness on standing. Funke MD made aware

## 2023-07-21 NOTE — ED Triage Notes (Signed)
 Pt presents to the ED POV from home with girlfriend. Pt reports multiple falls in the past couple of weeks. Last fall happened about 1 hour pta. Pt hit his head. Denies LOC. Reports taking a blood thinner. Pt is a diabetic. Falling asleep in triage. CBG 114.

## 2023-07-21 NOTE — H&P (Signed)
 History and Physical    Patient: Nicholas Weiss [id number]:[id number] DOB: May 08, 1950 DOA: 07/21/2023 DOS: the patient was seen and examined on 07/21/2023 PCP: Monique Ano, MD  Patient coming from: Home  Chief Complaint:  Chief Complaint  Patient presents with   Fall    HPI: Nicholas Weiss is a 73 y.o. male with medical history significant for hypertension, prior CVA, multiple myeloma, CKD 3B, paroxysmal A-fib, type 1 diabetes mellitus, A-fib on Eliquis , myoclonus on Keppra , dysautonomia/orthostatic hypotension, with recurrent syncope, frequent falls being admitted due to multiple recent falls, presyncope and inability of caregiver to continue caring for him. ED course and data review: BP 84/53 on arrival with otherwise normal vitals, improving to 179/98 with fluid bolus.  Possible orthostasis Labs with baseline leukopenia of 2.2, anemia and creatinine.  CK 48 EKG with junctional rhythm of 78 and prolonged QT CT head and C-spine nonacute.  CT neck does show left greater than right proximal ICA stenosis  Hospitalist consulted for admission.     Past Medical History:  Diagnosis Date   Acute metabolic encephalopathy 12/02/2020   DKA (diabetic ketoacidoses) 04/06/2016   Hypercholesteremia    Hypertension    Past Surgical History:  Procedure Laterality Date   AMPUTATION TOE Left 04/25/2022   Procedure: LEFT GREAT TOE AMPUTATION AND LEFT PARTIAL 2ND TOE AMPUTATION;  Surgeon: Dot Gazella, DPM;  Location: [facility] ORS;  Service: Podiatry;  Laterality: Left;   COLONOSCOPY WITH PROPOFOL  N/A 02/01/2020   Procedure: COLONOSCOPY WITH PROPOFOL ;  Surgeon: Selena Daily, MD;  Location: [facility] ENDOSCOPY;  Service: Gastroenterology;  Laterality: N/A;   ESOPHAGOGASTRODUODENOSCOPY  02/01/2020   Procedure: ESOPHAGOGASTRODUODENOSCOPY (EGD);  Surgeon: Selena Daily, MD;  Location: [facility] ENDOSCOPY;  Service: Gastroenterology;;   ESOPHAGOGASTRODUODENOSCOPY (EGD) WITH PROPOFOL  N/A 06/07/2022    Procedure: ESOPHAGOGASTRODUODENOSCOPY (EGD) WITH PROPOFOL ;  Surgeon: Genell Ken, MD;  Location: [facility] ENDOSCOPY;  Service: Gastroenterology;  Laterality: N/A;   KNEE SURGERY Right    Torn meniscus   KNEE SURGERY Left    Social History:  reports that he has never smoked. He has never used smokeless tobacco. He reports that he does not currently use alcohol. He reports current drug use. Drugs: Marijuana and "Crack" cocaine.  Allergies  Allergen Reactions   Penicillins Other (See Comments)    Childhood allergy -tolerated amoxil  03/2022 Unknown reaction Has patient had a PCN reaction causing immediate rash, facial/tongue/throat swelling, SOB or lightheadedness with hypotension: No Has patient had a PCN reaction causing severe rash involving mucus membranes or skin necrosis: No Has patient had a PCN reaction that required hospitalization No Has patient had a PCN reaction occurring within the last 10 years: No If all of the above answers are "NO", then may proceed with Cephalosporin use.     Family History  Problem Relation Age of Onset   Heart attack Father    Hypertension Sister    Cancer Sister     Prior to Admission medications   Medication Sig Start Date End Date Taking? Authorizing Provider  acetaminophen  (TYLENOL ) 500 MG tablet Take 1,000 mg by mouth daily as needed for mild pain or moderate pain.    [provider]  apixaban  (ELIQUIS ) 5 MG TABS tablet Take 1 tablet (5 mg total) by mouth 2 (two) times daily. Start Eliquis  after 2 weeks which will be 03/02/2023 03/02/23   Althia Atlas, MD  ascorbic acid  (VITAMIN C ) 500 MG tablet Take 1 tablet (500 mg total) by mouth daily. 04/09/22   Luna Salinas, MD  atorvastatin  (LIPITOR ) 80 MG tablet Take 1 tablet (80 mg total) by mouth at bedtime. Hold while taking Paxlovid  04/08/22   Luna Salinas, MD  bisacodyl  5 MG EC tablet Take 1 tablet (5 mg total) by mouth at bedtime. Skip the dose if no constipation 02/24/23   Althia Atlas, MD   Continuous Glucose Sensor (DEXCOM G7 SENSOR) MISC 1 each by Does not apply route 4 (four) times daily -  before meals and at bedtime. 04/10/23   Tiajuana Fluke, MD  fludrocortisone  (FLORINEF ) 0.1 MG tablet Take 1 tablet (0.1 mg total) by mouth daily. Skip the dose if SBP >130 02/25/23   Althia Atlas, MD  gabapentin  (NEURONTIN ) 300 MG capsule Take 300 mg by mouth 2 (two) times daily. 03/03/23   [provider]  hydrALAZINE  (APRESOLINE ) 25 MG tablet Take 1 tablet (25 mg total) by mouth 3 (three) times daily as needed (SBP >160). 02/24/23   Althia Atlas, MD  Insulin  Aspart FlexPen (NOVOLOG ) 100 UNIT/ML Sliding Scale: Glucose 70 - 120: 0 units Glucose 121 - 150: 1 unit Glucose 151 - 200: 2 units Glucose 201 - 250: 3 units Glucose 251 - 300: 5 units Glucose 301 - 350: 7 units Glucose 351 - 400: 9 units Glucose > 400: call your docotr 03/03/23   [provider]  insulin  degludec (TRESIBA  FLEXTOUCH) 100 UNIT/ML FlexTouch Pen Inject 5 Units into the skin daily. Home med. 04/10/23   Tiajuana Fluke, MD  insulin  lispro (HUMALOG) 100 UNIT/ML KwikPen Inject up to 50 units daily as directed 04/26/23   [provider]  iron  polysaccharides (NIFEREX) 150 MG capsule Take 1 capsule (150 mg total) by mouth daily. 03/20/23 06/18/23  Althia Atlas, MD  levETIRAcetam  (KEPPRA ) 500 MG tablet Take 1 tablet by mouth twice daily 07/05/23   Vaslow, [name] K, MD  Multiple Vitamin (MULTIVITAMIN WITH MINERALS) TABS tablet Take 1 tablet by mouth daily. 04/09/22   Luna Salinas, MD  NOVOLOG  100 UNIT/ML injection Sliding Scale: Glucose 70 - 120: 0 units  Glucose 121 - 150: 1 unit  Glucose 151 - 200: 2 units Glucose 201 - 250: 3 units  Glucose 251 - 300: 5 units  Glucose 301 - 350: 7 units  Glucose 351 - 400: 9 units  Glucose > 400: call your docotr 06/12/22   Oral Billings, MD  ondansetron  (ZOFRAN ) 8 MG tablet Take 8 mg by mouth daily as needed for nausea or vomiting.    [provider]   pantoprazole  (PROTONIX ) 40 MG tablet Take 40 mg by mouth daily. 04/11/23   [provider]  polyethylene glycol (MIRALAX ) 17 g packet Take 17 g by mouth 2 (two) times daily. Skip the dose if no constipation 02/24/23   Althia Atlas, MD  pomalidomide  (POMALYST ) 2 MG capsule Take 1 capsule (2 mg total) by mouth daily. Take for 21 days, the hold for 7 days. Repeat every 28 days. Patient not taking: Reported on 07/16/2023 07/09/23   Avonne Boettcher, MD  traZODone  (DESYREL ) 50 MG tablet Take 0.5 tablets (25 mg total) by mouth at bedtime as needed for sleep. 02/24/23   Althia Atlas, MD    Physical Exam: Vitals:   07/21/23 1800 07/21/23 1815 07/21/23 1830 07/21/23 1920  BP: (!) 182/95  (!) 174/97 (!) 179/98  Pulse: 65 65 63 62  Resp: 14 12 19 10   Temp:      TempSrc:      SpO2: 100% 100% 98% 100%  Weight:  Height:       Physical Exam Vitals and nursing note reviewed.  Constitutional:      General: He is not in acute distress.    Comments: Chronically ill-appearing male  HENT:     Head: Normocephalic and atraumatic.  Cardiovascular:     Rate and Rhythm: Normal rate and regular rhythm.     Heart sounds: Normal heart sounds.  Pulmonary:     Effort: Pulmonary effort is normal.     Breath sounds: Normal breath sounds.  Abdominal:     Palpations: Abdomen is soft.     Tenderness: There is no abdominal tenderness.  Neurological:     Mental Status: Mental status is at baseline.     Labs on Admission: I have personally reviewed following labs and imaging studies  CBC: Recent Labs  Lab 07/16/23 1139 07/21/23 1640  WBC 3.0* 2.2*  NEUTROABS 1.4*  --   HGB 10.2* 10.6*  HCT 31.6* 34.1*  MCV 96.0 99.4  PLT 250 241   Basic Metabolic Panel: Recent Labs  Lab 07/16/23 1139 07/21/23 1640  NA 133* 140  K 4.0 3.9  CL 98 107  CO2 27 27  GLUCOSE 295* 117*  BUN 31* 34*  CREATININE 1.97* 1.54*  CALCIUM  8.8* 8.6*   GFR: Estimated Creatinine Clearance: 45.3 mL/min (A) (by  C-G formula based on SCr of 1.54 mg/dL (H)). Liver Function Tests: Recent Labs  Lab 07/16/23 1139 07/21/23 1640  AST 16 27  ALT 23 33  ALKPHOS 69 50  BILITOT 0.6 0.5  PROT 8.1 7.6  ALBUMIN 3.6 3.1*   No results for input(s): "LIPASE", "AMYLASE" in the last 168 hours. No results for input(s): "AMMONIA " in the last 168 hours. Coagulation Profile: Recent Labs  Lab 07/21/23 1640  INR 1.3*   Cardiac Enzymes: Recent Labs  Lab 07/21/23 1640  CKTOTAL 48*   BNP (last 3 results) No results for input(s): "PROBNP" in the last 8760 hours. HbA1C: No results for input(s): "HGBA1C" in the last 72 hours. CBG: Recent Labs  Lab 07/21/23 1636 07/21/23 2012  GLUCAP 114* 181*   Lipid Profile: No results for input(s): "CHOL", "HDL", "LDLCALC", "TRIG", "CHOLHDL", "LDLDIRECT" in the last 72 hours. Thyroid  Function Tests: No results for input(s): "TSH", "T4TOTAL", "FREET4", "T3FREE", "THYROIDAB" in the last 72 hours. Anemia Panel: No results for input(s): "VITAMINB12", "FOLATE", "FERRITIN", "TIBC", "IRON ", "RETICCTPCT" in the last 72 hours. Urine analysis:    Component Value Date/Time   COLORURINE YELLOW (A) 07/21/2023 1919   APPEARANCEUR CLEAR (A) 07/21/2023 1919   LABSPEC 1.017 07/21/2023 1919   PHURINE 5.0 07/21/2023 1919   GLUCOSEU >=500 (A) 07/21/2023 1919   HGBUR NEGATIVE 07/21/2023 1919   BILIRUBINUR NEGATIVE 07/21/2023 1919   KETONESUR NEGATIVE 07/21/2023 1919   PROTEINUR 30 (A) 07/21/2023 1919   NITRITE NEGATIVE 07/21/2023 1919   LEUKOCYTESUR NEGATIVE 07/21/2023 1919    Radiological Exams on Admission: CT Cervical Spine Wo Contrast Result Date: 07/21/2023 CLINICAL DATA:  Neck trauma.  Fall. EXAM: CT CERVICAL SPINE WITHOUT CONTRAST TECHNIQUE: Multidetector CT imaging of the cervical spine was performed without intravenous contrast. Multiplanar CT image reconstructions were also generated. RADIATION DOSE REDUCTION: This exam was performed according to the departmental  dose-optimization program which includes automated exposure control, adjustment of the mA and/or kV according to patient size and/or use of iterative reconstruction technique. COMPARISON:  CT of the cervical spine 05/18/2023. FINDINGS: Alignment: Slight degenerative retrolisthesis is again noted at C5-6 and C6-7. No other significant listhesis is present.  Straightening of the normal cervical lordosis is present. Skull base and vertebrae: The craniocervical junction is normal. The vertebral body heights are normal. No acute fracture is present. Soft tissues and spinal canal: No prevertebral fluid or swelling. No visible canal hematoma. Atherosclerotic calcifications are present at the carotid bifurcations bilaterally with left greater than right proximal ICA stenosis. Disc levels: Facet hypertrophy and uncovertebral spurring result in moderate left foraminal stenosis at C3-4 and C4-5. Moderate foraminal narrowing is present bilaterally, left greater than right at C5-6 and C6-7. No significant interval change is present. Upper chest: The lung apices are clear. The thoracic inlet is within normal limits. IMPRESSION: 1. No acute fracture or traumatic subluxation. 2. Multilevel degenerative changes of the cervical spine as described. 3. Left greater than right proximal ICA stenosis. Electronically Signed   By: Audree Leas M.D.   On: 07/21/2023 17:14   CT Head Wo Contrast Result Date: 07/21/2023 CLINICAL DATA:  Fall. Patient is on blood thinners. Trauma to head. No loss of consciousness. EXAM: CT HEAD WITHOUT CONTRAST TECHNIQUE: Contiguous axial images were obtained from the base of the skull through the vertex without intravenous contrast. RADIATION DOSE REDUCTION: This exam was performed according to the departmental dose-optimization program which includes automated exposure control, adjustment of the mA and/or kV according to patient size and/or use of iterative reconstruction technique. COMPARISON:  CT  head without contrast 05/18/2023. FINDINGS: Brain: Extensive calcifications are again noted along the inter cerebral falx and tentorium. No acute infarct, hemorrhage, or mass lesion is present. Periventricular and subcortical white matter changes are mildly advanced for age, similar the prior study. Deep brain nuclei are within normal limits. The brainstem and cerebellum are within normal limits. Midline structures are within normal limits. Vascular: Atherosclerotic calcifications are present within the cavernous internal carotid arteries bilaterally. No hyperdense vessel is present. Skull: Calvarium is intact. No focal lytic or blastic lesions are present. No significant extracranial soft tissue lesion is present. Sinuses/Orbits: The paranasal sinuses and mastoid air cells are clear. The globes and orbits are within normal limits. IMPRESSION: 1. No acute intracranial abnormality or significant interval change. 2. Periventricular and subcortical white matter changes are mildly advanced for age, similar the prior study. This likely reflects the sequela of chronic microvascular ischemia. Electronically Signed   By: Audree Leas M.D.   On: 07/21/2023 17:11     Data Reviewed: Relevant notes from primary care and specialist visits, past discharge summaries as available in EHR, including Care Everywhere. Prior diagnostic testing as pertinent to current admission diagnoses Updated medications and problem lists for reconciliation ED course, including vitals, labs, imaging, treatment and response to treatment Triage notes, nursing and pharmacy notes and ED provider's notes Notable results as noted in HPI   Assessment and Plan:   Frequent falls Severe Orthostatic hypotension/dysautonomia History of injury (brain bleed, rib fractures) secondary to recurrent syncope with collapse Continue midodrine , continue Florinef  Avoid aggressively treating supine hypertension TED hose Fall precautions and up  with assistance Cardiac monitoring PT [facility] consult Patient had full syncope workup during prior hospitalizations with the following results: - CTA head and neck 01/2023 with 50% stenosis in the proximal bilateral ICAs, just distal to the bifurcations.Severe stenosis at the origin of the right vertebral artery -Echo 02/2023 with EF 65 to 70%, G1 DD, no significant valvulopathy  Type 1 diabetes Continue basal insulin  with sliding scale coverage   CKD 3B Creatinine at baseline Monitor renal function and avoid nephrotoxins    Atrial fibrillation (HCC)  Chronic anticoagulation Continue Eliquis  Not on beta-blocker due to chronic hypotension   Myoclonus Continue Keppra      Anemia of chronic kidney failure, stage 3 (moderate) (HCC) Hemoglobin at baseline    Essential (primary) hypertension Holding hydralazine  as patient also on midodrine  for orthostatic hypotension   Multiple myeloma (HCC) No acute issues suspected     DVT prophylaxis: Eliquis   Consults: none  Advance Care Planning:   Code Status: Prior   Family Communication: none  Disposition Plan: Back to previous home environment  Severity of Illness: The appropriate patient status for this patient is OBSERVATION. Observation status is judged to be reasonable and necessary in order to provide the required intensity of service to ensure the patient's safety. The patient's presenting symptoms, physical exam findings, and initial radiographic and laboratory data in the context of their medical condition is felt to place them at decreased risk for further clinical deterioration. Furthermore, it is anticipated that the patient will be medically stable for discharge from the hospital within 2 midnights of admission.   Author: Lanetta Pion, MD 07/21/2023 8:49 PM  For on call review [email or web address].

## 2023-07-22 ENCOUNTER — Encounter: Payer: Self-pay | Admitting: Internal Medicine

## 2023-07-22 ENCOUNTER — Other Ambulatory Visit: Payer: Self-pay

## 2023-07-22 DIAGNOSIS — R262 Difficulty in walking, not elsewhere classified: Secondary | ICD-10-CM

## 2023-07-22 DIAGNOSIS — R42 Dizziness and giddiness: Secondary | ICD-10-CM | POA: Diagnosis not present

## 2023-07-22 DIAGNOSIS — I10 Essential (primary) hypertension: Secondary | ICD-10-CM | POA: Diagnosis not present

## 2023-07-22 DIAGNOSIS — G253 Myoclonus: Secondary | ICD-10-CM

## 2023-07-22 DIAGNOSIS — Z8673 Personal history of transient ischemic attack (TIA), and cerebral infarction without residual deficits: Secondary | ICD-10-CM

## 2023-07-22 DIAGNOSIS — R9431 Abnormal electrocardiogram [ECG] [EKG]: Secondary | ICD-10-CM

## 2023-07-22 DIAGNOSIS — R55 Syncope and collapse: Secondary | ICD-10-CM | POA: Diagnosis not present

## 2023-07-22 DIAGNOSIS — E1040 Type 1 diabetes mellitus with diabetic neuropathy, unspecified: Secondary | ICD-10-CM

## 2023-07-22 LAB — GLUCOSE, CAPILLARY
Glucose-Capillary: 139 mg/dL — ABNORMAL HIGH (ref 70–99)
Glucose-Capillary: 173 mg/dL — ABNORMAL HIGH (ref 70–99)
Glucose-Capillary: 183 mg/dL — ABNORMAL HIGH (ref 70–99)

## 2023-07-22 LAB — CBG MONITORING, ED
Glucose-Capillary: 175 mg/dL — ABNORMAL HIGH (ref 70–99)
Glucose-Capillary: 166 mg/dL — ABNORMAL HIGH (ref 70–99)
Glucose-Capillary: 137 mg/dL — ABNORMAL HIGH (ref 70–99)

## 2023-07-22 LAB — HEMOGLOBIN A1C
Hgb A1c MFr Bld: 10 % — ABNORMAL HIGH (ref 4.8–5.6)
Mean Plasma Glucose: 240.3 mg/dL

## 2023-07-22 NOTE — Progress Notes (Signed)
 Progress Note   Patient: Nicholas Weiss AVW:[id number] DOB: [birth year] DOA: 07/21/2023     0 DOS: the patient was seen and examined on 07/22/2023   Brief hospital course: NASSIM COSMA is a 73 y.o. male with medical history significant for hypertension, prior CVA, multiple myeloma, CKD 3B, paroxysmal A-fib, type 1 diabetes mellitus, A-fib on Eliquis , myoclonus on Keppra , dysautonomia/orthostatic hypotension, with recurrent syncope, frequent falls being admitted due to multiple recent falls, presyncope and inability of caregiver to continue caring for him.   Assessment and Plan: Frequent falls Severe Orthostatic hypotension/dysautonomia History of injury (brain bleed, rib fractures) secondary to recurrent syncope with collapse Patient had full syncope workup during prior hospitalizations with the following results: - CTA head and neck 01/2023 with 50% stenosis in the proximal bilateral ICAs, just distal to the bifurcations.Severe stenosis at the origin of the right vertebral artery -Echo 02/2023 with EF 65 to 70%, G1 DD, no significant valvulopathy Continue midodrine , continue Florinef  Continue TED hose Fall precautions and up with assistance Cardiac monitoring PT/ OT evaluation. TOC consult for placement.   Type 1 diabetes Sugars stable. Continue Semglee  5 units with sliding scale coverage. RN notified that his insulin  pump is removed.   CKD 3B Creatinine at baseline Monitor renal function and avoid nephrotoxins   Atrial fibrillation (HCC) Chronic anticoagulation Continue Eliquis  Not on beta-blocker due to chronic hypotension   Prolonged QT Avoid QT prolonging drugs.  Myoclonus Continue Keppra    Anemia of chronic kidney failure, stage 3 (moderate) (HCC) Hemoglobin at baseline. No active bleeding.    Essential (primary) hypertension BP stable.  Hold hydralazine  for now.   Multiple myeloma (HCC) No acute issues suspected      Out of bed to chair. Incentive  spirometry. Nursing supportive care. Fall, aspiration precautions. Diet:  Diet Orders (From admission, onward)     Start     Ordered   07/21/23 2101  Diet heart healthy/carb modified Room service appropriate? Yes; Fluid consistency: Thin  Diet effective now       Question Answer Comment  Diet-HS Snack? Nothing   Room service appropriate? Yes   Fluid consistency: Thin      07/21/23 2101           DVT prophylaxis: Place TED hose Start: 07/21/23 2118 apixaban  (ELIQUIS ) tablet 5 mg  Level of care: Telemetry Medical   Code Status: Full Code  Subjective: Patient is seen and examined today morning.  States that his legs are weak and giving away while walking.  Also does have bilateral knee pain.  Physical Exam: Vitals:   07/22/23 1032 07/22/23 1100 07/22/23 1130 07/22/23 1200  BP:  (!) 140/82 (!) 144/80 (!) 143/81  Pulse:  60 (!) 57 60  Resp:  14 10 11   Temp: 97.9 F (36.6 C)     TempSrc: Oral     SpO2:  100% 100% 100%  Weight:      Height:        General - Elderly African-American male, no apparent distress HEENT - PERRLA, EOMI, atraumatic head, non tender sinuses. Lung - Clear, no rales, rhonchi, wheezes. Heart - S1, S2 heard, no murmurs, rubs, trace pedal edema. Abdomen - Soft, non tender, bowel sounds good Neuro - Alert, awake and oriented x 3, non focal exam. Skin - Warm and dry.  Data Reviewed:      Latest Ref Rng & Units 07/21/2023    4:40 PM 07/16/2023   11:39 AM 07/11/2023    2:07 PM  CBC  WBC 4.0 - 10.5 K/uL 2.2  3.0  1.8   Hemoglobin 13.0 - 17.0 g/dL 32.9  51.8  8.9   Hematocrit 39.0 - 52.0 % 34.1  31.6  26.4   Platelets 150 - 400 K/uL 241  250  181       Latest Ref Rng & Units 07/21/2023    4:40 PM 07/16/2023   11:39 AM 07/11/2023    2:07 PM  BMP  Glucose 70 - 99 mg/dL 841  660  630   BUN 8 - 23 mg/dL 34  31  37   Creatinine 0.61 - 1.24 mg/dL 1.60  1.09  3.23   Sodium 135 - 145 mmol/L 140  133  135   Potassium 3.5 - 5.1 mmol/L 3.9  4.0  4.1    Chloride 98 - 111 mmol/L 107  98  102   CO2 22 - 32 mmol/L 27  27  23    Calcium  8.9 - 10.3 mg/dL 8.6  8.8  8.8    CT Cervical Spine Wo Contrast Result Date: 07/21/2023 CLINICAL DATA:  Neck trauma.  Fall. EXAM: CT CERVICAL SPINE WITHOUT CONTRAST TECHNIQUE: Multidetector CT imaging of the cervical spine was performed without intravenous contrast. Multiplanar CT image reconstructions were also generated. RADIATION DOSE REDUCTION: This exam was performed according to the departmental dose-optimization program which includes automated exposure control, adjustment of the mA and/or kV according to patient size and/or use of iterative reconstruction technique. COMPARISON:  CT of the cervical spine 05/18/2023. FINDINGS: Alignment: Slight degenerative retrolisthesis is again noted at C5-6 and C6-7. No other significant listhesis is present. Straightening of the normal cervical lordosis is present. Skull base and vertebrae: The craniocervical junction is normal. The vertebral body heights are normal. No acute fracture is present. Soft tissues and spinal canal: No prevertebral fluid or swelling. No visible canal hematoma. Atherosclerotic calcifications are present at the carotid bifurcations bilaterally with left greater than right proximal ICA stenosis. Disc levels: Facet hypertrophy and uncovertebral spurring result in moderate left foraminal stenosis at C3-4 and C4-5. Moderate foraminal narrowing is present bilaterally, left greater than right at C5-6 and C6-7. No significant interval change is present. Upper chest: The lung apices are clear. The thoracic inlet is within normal limits. IMPRESSION: 1. No acute fracture or traumatic subluxation. 2. Multilevel degenerative changes of the cervical spine as described. 3. Left greater than right proximal ICA stenosis. Electronically Signed   By: Audree Leas M.D.   On: 07/21/2023 17:14   CT Head Wo Contrast Result Date: 07/21/2023 CLINICAL DATA:  Fall. Patient is  on blood thinners. Trauma to head. No loss of consciousness. EXAM: CT HEAD WITHOUT CONTRAST TECHNIQUE: Contiguous axial images were obtained from the base of the skull through the vertex without intravenous contrast. RADIATION DOSE REDUCTION: This exam was performed according to the departmental dose-optimization program which includes automated exposure control, adjustment of the mA and/or kV according to patient size and/or use of iterative reconstruction technique. COMPARISON:  CT head without contrast 05/18/2023. FINDINGS: Brain: Extensive calcifications are again noted along the inter cerebral falx and tentorium. No acute infarct, hemorrhage, or mass lesion is present. Periventricular and subcortical white matter changes are mildly advanced for age, similar the prior study. Deep brain nuclei are within normal limits. The brainstem and cerebellum are within normal limits. Midline structures are within normal limits. Vascular: Atherosclerotic calcifications are present within the cavernous internal carotid arteries bilaterally. No hyperdense vessel is present. Skull: Calvarium is intact. No focal  lytic or blastic lesions are present. No significant extracranial soft tissue lesion is present. Sinuses/Orbits: The paranasal sinuses and mastoid air cells are clear. The globes and orbits are within normal limits. IMPRESSION: 1. No acute intracranial abnormality or significant interval change. 2. Periventricular and subcortical white matter changes are mildly advanced for age, similar the prior study. This likely reflects the sequela of chronic microvascular ischemia. Electronically Signed   By: Audree Leas M.D.   On: 07/21/2023 17:11    Family Communication: Discussed with patient, he understand and agree. All questions answered.  Disposition: Status is: Observation The patient remains OBS appropriate and will d/c before 2 midnights.  Planned Discharge Destination: Rehab     Time spent: 37  minutes  Author: Aisha Hove, MD 07/22/2023 1:26 PM Secure chat 7am to 7pm For on call review [email or web address].

## 2023-07-22 NOTE — TOC Initial Note (Signed)
 Transition of Care [facility]) - Initial/Assessment Note    Patient Details  Name: Nicholas Weiss MRN: [id number] Date of Birth: 03-24-1951  Transition [facility]) CM/SW Contact:    Crayton Docker, RN Phone Number: 07/22/2023, 4:23 PM  Clinical Narrative:                  CM to patient's room regarding TOC screening assessment. CM introduced case management role and discharge care planning process. Patient verbalized understanding and agreement with TOC screening interview. Patient states no previous home health experience. Patient significant other arrives, and states previous SNF experience at [facility] from 01/2023 to 04/05/2023.  Expected Discharge Plan: Home/Self Care Barriers to Discharge: Continued Medical Work up   Patient Goals and CMS Choice    Home      Expected Discharge Plan and Services   Discharge Planning Services: CM Consult   Living arrangements for the past 2 months: Single Family Home                    Home     Prior Living Arrangements/Services Living arrangements for the past 2 months: Single Family Home Lives with:: Pets, Adult Children, Significant Other Patient language and need for interpreter reviewed:: No Do you feel safe going back to the place where you live?: Yes      Need for Family Participation in Patient Care: Yes (Comment) Care giver support system in place?: Yes (comment) Current home services: DME (cane and walker;) Criminal Activity/Legal Involvement Pertinent to Current Situation/Hospitalization: No - Comment as needed  Activities of Daily Living   ADL Screening (condition at time of admission) Independently performs ADLs?: No Does the patient have a NEW difficulty with bathing/dressing/toileting/self-feeding that is expected to last >3 days?: Yes (Initiates electronic notice to provider for possible OT consult) Does the patient have a NEW difficulty with getting in/out of bed, walking, or climbing stairs that  is expected to last >3 days?: Yes (Initiates electronic notice to provider for possible PT consult) Does the patient have a NEW difficulty with communication that is expected to last >3 days?: No Is the patient deaf or have difficulty hearing?: No Does the patient have difficulty seeing, even when wearing glasses/contacts?: No Does the patient have difficulty concentrating, remembering, or making decisions?: No  Permission Sought/Granted Permission sought to share information with : Case Manager, Family Supports Permission granted to share information with : Yes, Verbal Permission Granted  Share Information with NAME: Taner Rzepka     Permission granted to share info w Relationship: Significant other/Son  Permission granted to share info w Contact Information: [phone]  Emotional Assessment Appearance:: Appears stated age Attitude/Demeanor/Rapport: Engaged Affect (typically observed): Calm Orientation: : Oriented to Self, Oriented to Place, Oriented to  Time, Oriented to Situation   Psych Involvement: No (comment)  Admission diagnosis:  Orthostatic hypotension [I95.1] Fall, initial encounter [W19.XXXA] Postural dizziness with presyncope [R42, R55] Multiple myeloma, remission status unspecified (HCC) [C90.00] Patient Active Problem List   Diagnosis Date Noted   Prolonged QT interval 07/22/2023   Uncontrolled type 1 diabetes mellitus with hypoglycemia, with long-term current use of insulin  (HCC) 04/05/2023   Hyperosmolar hyperglycemic state (HHS) (HCC) 03/26/2023   Iron  deficiency anemia 03/26/2023   Seizure disorder (HCC) 03/26/2023   Systemic inflammatory response syndrome (SIRS) associated with organ dysfunction (HCC) 03/26/2023   NSTEMI (non-ST elevated myocardial infarction) (HCC) 03/09/2023   Elevated troponin 03/08/2023   Paroxysmal atrial fibrillation (HCC) 03/08/2023  Chronic anticoagulation 03/08/2023   ICH (intracerebral hemorrhage) (HCC)  02/16/2023   Uncontrolled diabetes mellitus with hyperglycemia (HCC) 02/16/2023   GERD without esophagitis 02/16/2023   Hypertension 02/16/2023   Diabetic neuropathy (HCC) 02/16/2023   Intraventricular hemorrhage (HCC) 02/16/2023   Myoclonus 01/15/2023   Hypothermia 12/29/2022   Dizziness 11/13/2022   Substance abuse (HCC) 09/18/2022   History of GI bleed 09/18/2022   History of intraventricular hemorrhage following syncopal event 02/15/2023 09/18/2022   Uncontrolled type 1 diabetes mellitus with hyperglycemia, with long-term current use of insulin  (HCC) 08/26/2022   Postural dizziness with presyncope 08/18/2022   Near syncope 08/18/2022   Closed fracture of shaft of left humerus 06/18/2022   Malnutrition of moderate degree 06/10/2022   Hypotension 06/10/2022   GI bleed 06/06/2022   Subdural hematoma (HCC) 05/15/2022   Falls 05/15/2022   Shoulder pain, left 05/15/2022   History of amputation of left great toe (HCC) 05/04/2022   Hypophosphatemia 04/28/2022   Protein-calorie malnutrition, severe 04/24/2022   Diabetic ulcer of toe associated with diabetes mellitus due to underlying condition, with fat layer exposed (HCC) 04/24/2022   Streptococcal bacteremia 04/24/2022   Leukopenia 04/24/2022   History of multiple myeloma 04/24/2022   Dysautonomia orthostatic hypotension with recurrent syncope 04/24/2022   Weight loss 04/23/2022   Osteomyelitis of left foot (HCC) 04/22/2022   DKA, type 1 (HCC) 04/22/2022   Dehydration 04/08/2022   Diabetic ketoacidosis without coma associated with type 1 diabetes mellitus (HCC) 04/07/2022   Hyperkalemia 04/07/2022   Traumatic pneumothorax 02/18/2022   Multiple fractures of ribs, left side, initial encounter for closed fracture 02/18/2022   Pneumothorax, closed, traumatic 02/18/2022   Benign prostatic hyperplasia with nocturia 02/12/2022   Stroke (cerebrum) (HCC) 11/07/2021   Multiple myeloma (HCC) 09/29/2021   Multiple myeloma not having  achieved remission (HCC) 09/29/2021   Hyponatremia 08/18/2021   COVID-19 virus infection 08/13/2021   History of CVA (cerebrovascular accident) 08/13/2021   Syncope and collapse, recurrent 08/09/2021   Hepatic lesion 08/05/2021   Dyslipidemia 05/08/2021   History of substance abuse (HCC) 12/09/2020   Acute metabolic encephalopathy 12/02/2020   Cocaine abuse (HCC) 11/30/2020   Muscle twitching 09/12/2020   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   Pre-syncope 08/01/2020   Heart block AV first degree 08/01/2020   Anemia of chronic kidney failure, stage 3 (moderate) (HCC) 05/28/2020   PUD (peptic ulcer disease)    Esophageal dysphagia    Encounter for screening colonoscopy    Hyperglycemia 07/28/2019   Generalized weakness 07/28/2019   Hypoglycemia 04/27/2019   Unresponsiveness 04/27/2019   Lung nodule 04/07/2019   Acute renal failure superimposed on stage 3a chronic kidney disease (HCC)    Hypertensive urgency 04/05/2019   AKI (acute kidney injury) (HCC) 04/05/2019   CKD (chronic kidney disease) stage 3, GFR 30-59 ml/min (HCC) 04/05/2019   Hyperlipidemia 01/24/2016   Type 1 diabetes mellitus with diabetic neuropathy, unspecified (HCC) 05/31/2006   PCP:  Monique Ano, MD Pharmacy:   [facility] [place] [address] ([address]), [place] - [address] [place] ([place]) [place] [address] Phone: [phone] Fax: [phone]  [facility] - [place], [place] - [address] [place] [address] Phone: [phone] Fax: [phone]  [facility] - [place], [place] - [address] [place] [address] Phone: [phone] Fax: [phone]  [facility] [address] [place] [address] Phone: [phone] Fax: [phone]  Social Drivers of Health (SDOH) Social History: SDOH Screenings   Food Insecurity: No Food Insecurity  (07/22/2023)  Housing: Low Risk  (07/22/2023)  Transportation Needs: No Transportation Needs (07/22/2023)  Utilities: Not At Risk (07/22/2023)  Alcohol Screen: Low Risk  (10/02/2020)  Depression (PHQ2-9): Medium Risk (10/02/2020)  Financial Resource Strain: Low Risk  (05/19/2023)   Received from [facility]  Physical Activity: Insufficiently Active (01/01/2020)  Social Connections: Patient Declined (07/22/2023)  Stress: No Stress Concern Present (01/01/2020)  Tobacco Use: Low Risk  (07/22/2023)  Recent Concern: Tobacco Use - Medium Risk (07/09/2023)   Received from [facility]   SDOH Interventions:     Readmission Risk Interventions    02/16/2023    3:56 PM 08/24/2022   10:57 AM 05/20/2022   11:41 AM  Readmission Risk Prevention Plan  Transportation Screening Complete Complete Complete  Medication Review Oceanographer) Complete Complete Complete  PCP or Specialist appointment within 3-5 days of discharge Complete Complete Complete  HRI or Home Care Consult Complete Complete Complete  SW Recovery Care/Counseling Consult Not Complete    Palliative Care Screening Not Applicable  Complete  Skilled Nursing Facility Complete Complete Complete

## 2023-07-22 NOTE — ED Notes (Addendum)
 Pt req tylenol  for headache. Medicated per [facility]

## 2023-07-22 NOTE — ED Notes (Signed)
 ED TO INPATIENT HANDOFF REPORT  ED Nurse Name and Phone #:  Nira Basset [phone]  S Name/Age/Gender Conny Del 73 y.o. male Room/Bed: SR24A/SR24A-AA  Code Status   Code Status: Full Code  Home/SNF/Other Home Patient oriented to: self, place, time, and situation Is this baseline? Yes   Triage Complete: Triage complete  Chief Complaint Postural dizziness with presyncope [R42, R55]  Triage Note Pt presents to the ED POV from home with girlfriend. Pt reports multiple falls in the past couple of weeks. Last fall happened about 1 hour pta. Pt hit his head. Denies LOC. Reports taking a blood thinner. Pt is a diabetic. Falling asleep in triage. CBG 114.   Allergies Allergies  Allergen Reactions   Penicillins Other (See Comments)    Childhood allergy -tolerated amoxil  03/2022 Unknown reaction Has patient had a PCN reaction causing immediate rash, facial/tongue/throat swelling, SOB or lightheadedness with hypotension: No Has patient had a PCN reaction causing severe rash involving mucus membranes or skin necrosis: No Has patient had a PCN reaction that required hospitalization No Has patient had a PCN reaction occurring within the last 10 years: No If all of the above answers are "NO", then may proceed with Cephalosporin use.     Level of Care/Admitting Diagnosis ED Disposition     ED Disposition  Admit   Condition  --   Comment  Hospital Area: [facility] [[id number]]  Level of Care: Telemetry Medical [104]  Covid Evaluation: Asymptomatic - no recent exposure (last 10 days) testing not required  Diagnosis: Postural dizziness with presyncope [[id number]]  Admitting Physician: Lanetta Pion [[id number]]  Attending Physician: Lanetta Pion [[id number]]          B Medical/Surgery History Past Medical History:  Diagnosis Date   Acute metabolic encephalopathy 12/02/2020   DKA (diabetic ketoacidoses) 04/06/2016   Hypercholesteremia    Hypertension    Past  Surgical History:  Procedure Laterality Date   AMPUTATION TOE Left 04/25/2022   Procedure: LEFT GREAT TOE AMPUTATION AND LEFT PARTIAL 2ND TOE AMPUTATION;  Surgeon: Dot Gazella, DPM;  Location: [facility] ORS;  Service: Podiatry;  Laterality: Left;   COLONOSCOPY WITH PROPOFOL  N/A 02/01/2020   Procedure: COLONOSCOPY WITH PROPOFOL ;  Surgeon: Selena Daily, MD;  Location: [facility] ENDOSCOPY;  Service: Gastroenterology;  Laterality: N/A;   ESOPHAGOGASTRODUODENOSCOPY  02/01/2020   Procedure: ESOPHAGOGASTRODUODENOSCOPY (EGD);  Surgeon: Selena Daily, MD;  Location: [facility] ENDOSCOPY;  Service: Gastroenterology;;   ESOPHAGOGASTRODUODENOSCOPY (EGD) WITH PROPOFOL  N/A 06/07/2022   Procedure: ESOPHAGOGASTRODUODENOSCOPY (EGD) WITH PROPOFOL ;  Surgeon: Genell Ken, MD;  Location: [facility] ENDOSCOPY;  Service: Gastroenterology;  Laterality: N/A;   KNEE SURGERY Right    Torn meniscus   KNEE SURGERY Left      A IV Location/Drains/Wounds Patient Lines/Drains/Airways Status     Active Line/Drains/Airways     Name Placement date Placement time Site Days   Peripheral IV 07/21/23 18 G 1.16" Anterior;Distal;Left;Upper Arm 07/21/23  1643  Arm  1            Intake/Output Last 24 hours No intake or output data in the 24 hours ending 07/22/23 1215  Labs/Imaging Results for orders placed or performed during the hospital encounter of 07/21/23 (from the past 48 hours)  CBG monitoring, ED     Status: Abnormal   Collection Time: 07/21/23  4:36 PM  Result Value Ref Range   Glucose-Capillary 114 (H) 70 - 99 mg/dL    Comment: Glucose reference range applies only to samples taken  after fasting for at least 8 hours.  CBC     Status: Abnormal   Collection Time: 07/21/23  4:40 PM  Result Value Ref Range   WBC 2.2 (L) 4.0 - 10.5 K/uL   RBC 3.43 (L) 4.22 - 5.81 MIL/uL   Hemoglobin 10.6 (L) 13.0 - 17.0 g/dL   HCT 40.9 (L) 81.1 - 91.4 %   MCV 99.4 80.0 - 100.0 fL   MCH 30.9 26.0 - 34.0 pg   MCHC 31.1 30.0 - 36.0 g/dL    RDW 78.2 (H) 95.6 - 15.5 %   Platelets 241 150 - 400 K/uL   nRBC 0.0 0.0 - 0.2 %    Comment: Performed at [facility], [address] [place], [place] [address]  Comprehensive metabolic panel     Status: Abnormal   Collection Time: 07/21/23  4:40 PM  Result Value Ref Range   Sodium 140 135 - 145 mmol/L   Potassium 3.9 3.5 - 5.1 mmol/L   Chloride 107 98 - 111 mmol/L   CO2 27 22 - 32 mmol/L   Glucose, Bld 117 (H) 70 - 99 mg/dL    Comment: Glucose reference range applies only to samples taken after fasting for at least 8 hours.   BUN 34 (H) 8 - 23 mg/dL   Creatinine, Ser 6.57 (H) 0.61 - 1.24 mg/dL   Calcium  8.6 (L) 8.9 - 10.3 mg/dL   Total Protein 7.6 6.5 - 8.1 g/dL   Albumin 3.1 (L) 3.5 - 5.0 g/dL   AST 27 15 - 41 U/L   ALT 33 0 - 44 U/L   Alkaline Phosphatase 50 38 - 126 U/L   Total Bilirubin 0.5 0.0 - 1.2 mg/dL   GFR, Estimated 48 (L) >60 mL/min    Comment: (NOTE) Calculated using the CKD-EPI Creatinine Equation (2021)    Anion gap 6 5 - 15    Comment: Performed at [facility], [address] [place], [place] [address]  Troponin I (High Sensitivity)     Status: None   Collection Time: 07/21/23  4:40 PM  Result Value Ref Range   Troponin I (High Sensitivity) 13 <18 ng/L    Comment: (NOTE) Elevated high sensitivity troponin I (hsTnI) values and significant  changes across serial measurements may suggest ACS but many other  chronic and acute conditions are known to elevate hsTnI results.  Refer to the "Links" section for chest pain algorithms and additional  guidance. Performed at [facility], [address] [place], [place] [address]   Protime-INR     Status: Abnormal   Collection Time: 07/21/23  4:40 PM  Result Value Ref Range   Prothrombin Time 16.4 (H) 11.4 - 15.2 seconds   INR 1.3 (H) 0.8 - 1.2    Comment: (NOTE) INR goal varies based on device and disease states. Performed at [facility], [address]  [place], [place] [address]   CK     Status: Abnormal   Collection Time: 07/21/23  4:40 PM  Result Value Ref Range   Total CK 48 (L) 49 - 397 U/L    Comment: Performed at [facility], [address] [place], [place] [address]  Troponin I (High Sensitivity)     Status: None   Collection Time: 07/21/23  6:40 PM  Result Value Ref Range   Troponin I (High Sensitivity) 10 <18 ng/L    Comment: (NOTE) Elevated high sensitivity troponin I (hsTnI) values and significant  changes across serial  measurements may suggest ACS but many other  chronic and acute conditions are known to elevate hsTnI results.  Refer to the "Links" section for chest pain algorithms and additional  guidance. Performed at [facility], [address] [place], [place] [address]   Urinalysis, Routine w reflex microscopic -Urine, Clean Catch     Status: Abnormal   Collection Time: 07/21/23  7:19 PM  Result Value Ref Range   Color, Urine YELLOW (A) YELLOW   APPearance CLEAR (A) CLEAR   Specific Gravity, Urine 1.017 1.005 - 1.030   pH 5.0 5.0 - 8.0   Glucose, UA >=500 (A) NEGATIVE mg/dL   Hgb urine dipstick NEGATIVE NEGATIVE   Bilirubin Urine NEGATIVE NEGATIVE   Ketones, ur NEGATIVE NEGATIVE mg/dL   Protein, ur 30 (A) NEGATIVE mg/dL   Nitrite NEGATIVE NEGATIVE   Leukocytes,Ua NEGATIVE NEGATIVE   RBC / HPF 0-5 0 - 5 RBC/hpf   WBC, UA 0-5 0 - 5 WBC/hpf   Bacteria, UA NONE SEEN NONE SEEN   Squamous Epithelial / HPF 0 0 - 5 /HPF   Mucus PRESENT    Hyaline Casts, UA PRESENT     Comment: Performed at [facility], [address] [place], [place] [address]  CBG monitoring, ED     Status: Abnormal   Collection Time: 07/21/23  8:12 PM  Result Value Ref Range   Glucose-Capillary 181 (H) 70 - 99 mg/dL    Comment: Glucose reference range applies only to samples taken after fasting for at least 8 hours.  CBG monitoring, ED     Status: Abnormal   Collection Time: 07/21/23 10:48 PM  Result Value  Ref Range   Glucose-Capillary 205 (H) 70 - 99 mg/dL    Comment: Glucose reference range applies only to samples taken after fasting for at least 8 hours.  CBG monitoring, ED     Status: Abnormal   Collection Time: 07/22/23  4:36 AM  Result Value Ref Range   Glucose-Capillary 175 (H) 70 - 99 mg/dL    Comment: Glucose reference range applies only to samples taken after fasting for at least 8 hours.  CBG monitoring, ED     Status: Abnormal   Collection Time: 07/22/23  9:48 AM  Result Value Ref Range   Glucose-Capillary 166 (H) 70 - 99 mg/dL    Comment: Glucose reference range applies only to samples taken after fasting for at least 8 hours.   Comment 1 Notify RN    Comment 2 Document in Chart    *Note: Due to a large number of results and/or encounters for the requested time period, some results have not been displayed. A complete set of results can be found in Results Review.   CT Cervical Spine Wo Contrast Result Date: 07/21/2023 CLINICAL DATA:  Neck trauma.  Fall. EXAM: CT CERVICAL SPINE WITHOUT CONTRAST TECHNIQUE: Multidetector CT imaging of the cervical spine was performed without intravenous contrast. Multiplanar CT image reconstructions were also generated. RADIATION DOSE REDUCTION: This exam was performed according to the departmental dose-optimization program which includes automated exposure control, adjustment of the mA and/or kV according to patient size and/or use of iterative reconstruction technique. COMPARISON:  CT of the cervical spine 05/18/2023. FINDINGS: Alignment: Slight degenerative retrolisthesis is again noted at C5-6 and C6-7. No other significant listhesis is present. Straightening of the normal cervical lordosis is present. Skull base and vertebrae: The craniocervical junction is normal. The vertebral body heights are normal. No acute fracture is present. Soft tissues  and spinal canal: No prevertebral fluid or swelling. No visible canal hematoma. Atherosclerotic  calcifications are present at the carotid bifurcations bilaterally with left greater than right proximal ICA stenosis. Disc levels: Facet hypertrophy and uncovertebral spurring result in moderate left foraminal stenosis at C3-4 and C4-5. Moderate foraminal narrowing is present bilaterally, left greater than right at C5-6 and C6-7. No significant interval change is present. Upper chest: The lung apices are clear. The thoracic inlet is within normal limits. IMPRESSION: 1. No acute fracture or traumatic subluxation. 2. Multilevel degenerative changes of the cervical spine as described. 3. Left greater than right proximal ICA stenosis. Electronically Signed   By: Audree Leas M.D.   On: 07/21/2023 17:14   CT Head Wo Contrast Result Date: 07/21/2023 CLINICAL DATA:  Fall. Patient is on blood thinners. Trauma to head. No loss of consciousness. EXAM: CT HEAD WITHOUT CONTRAST TECHNIQUE: Contiguous axial images were obtained from the base of the skull through the vertex without intravenous contrast. RADIATION DOSE REDUCTION: This exam was performed according to the departmental dose-optimization program which includes automated exposure control, adjustment of the mA and/or kV according to patient size and/or use of iterative reconstruction technique. COMPARISON:  CT head without contrast 05/18/2023. FINDINGS: Brain: Extensive calcifications are again noted along the inter cerebral falx and tentorium. No acute infarct, hemorrhage, or mass lesion is present. Periventricular and subcortical white matter changes are mildly advanced for age, similar the prior study. Deep brain nuclei are within normal limits. The brainstem and cerebellum are within normal limits. Midline structures are within normal limits. Vascular: Atherosclerotic calcifications are present within the cavernous internal carotid arteries bilaterally. No hyperdense vessel is present. Skull: Calvarium is intact. No focal lytic or blastic lesions are  present. No significant extracranial soft tissue lesion is present. Sinuses/Orbits: The paranasal sinuses and mastoid air cells are clear. The globes and orbits are within normal limits. IMPRESSION: 1. No acute intracranial abnormality or significant interval change. 2. Periventricular and subcortical white matter changes are mildly advanced for age, similar the prior study. This likely reflects the sequela of chronic microvascular ischemia. Electronically Signed   By: Audree Leas M.D.   On: 07/21/2023 17:11    Pending Labs Unresulted Labs (From admission, onward)    None       Vitals/Pain Today's Vitals   07/22/23 1023 07/22/23 1032 07/22/23 1100 07/22/23 1130  BP:   (!) 140/82 (!) 144/80  Pulse:   60 (!) 57  Resp:   14 10  Temp:  97.9 F (36.6 C)    TempSrc:  Oral    SpO2:   100% 100%  Weight:      Height:      PainSc: 0-No pain       Isolation Precautions No active isolations  Medications Medications  atorvastatin  (LIPITOR ) tablet 80 mg (80 mg Oral Given 07/21/23 2249)  fludrocortisone  (FLORINEF ) tablet 0.1 mg (0.1 mg Oral Given 07/22/23 1018)  insulin  glargine-yfgn (SEMGLEE ) injection 5 Units (5 Units Subcutaneous Given 07/22/23 1018)  pantoprazole  (PROTONIX ) EC tablet 40 mg (40 mg Oral Given 07/22/23 1018)  apixaban  (ELIQUIS ) tablet 5 mg (5 mg Oral Given 07/22/23 1018)  levETIRAcetam  (KEPPRA ) tablet 500 mg (500 mg Oral Given 07/22/23 1018)  sodium chloride  flush (NS) 0.9 % injection 3 mL (3 mLs Intravenous Given 07/22/23 1023)  insulin  aspart (novoLOG ) injection 0-9 Units (2 Units Subcutaneous Given 07/22/23 1018)  insulin  aspart (novoLOG ) injection 0-5 Units (2 Units Subcutaneous Given 07/21/23 2254)  ondansetron  (ZOFRAN ) tablet 4  mg (has no administration in time range)    Or  ondansetron  (ZOFRAN ) injection 4 mg (has no administration in time range)  acetaminophen  (TYLENOL ) tablet 650 mg (650 mg Oral Given 07/22/23 0639)    Or  acetaminophen  (TYLENOL ) suppository  650 mg ( Rectal See Alternative 07/22/23 0639)  sodium chloride  0.9 % bolus 500 mL (0 mLs Intravenous Stopped 07/21/23 2143)    Mobility walks with device     Focused Assessments Neuro Assessment Handoff:  Swallow screen pass? Yes  Cardiac Rhythm: Normal sinus rhythm       Neuro Assessment: Within Defined Limits Neuro Checks:      Has TPA been given? No If patient is a Neuro Trauma and patient is going to OR before floor call report to 4N Charge nurse: [phone] or [phone]   R Recommendations: See Admitting Provider Note  Report given to:  Additional Notes:

## 2023-07-22 NOTE — Care Management Obs Status (Signed)
 MEDICARE OBSERVATION STATUS NOTIFICATION   Patient Details  Name: Nicholas Weiss MRN: [id number] Date of Birth: 23-Mar-1951   Medicare Observation Status Notification Given:  Yes    Amarius Toto D Shantelle Alles, LCSW 07/22/2023, 5:50 PM

## 2023-07-22 NOTE — ED Notes (Signed)
 Hospitalist at bedside

## 2023-07-22 NOTE — Care Management CC44 (Signed)
 Condition Code 44 Documentation Completed  Patient Details  Name: [name] MRN: [id number] Date of Birth: 06/08/1950   Condition Code 44 given:  Yes Patient signature on Condition Code 44 notice:  Yes Documentation of 2 MD's agreement:  Yes Code 44 added to claim:  Yes    Cari Char Mylinh Cragg, LCSW 07/22/2023, 5:50 PM

## 2023-07-22 NOTE — Plan of Care (Signed)
  Problem: Coping: Goal: Ability to adjust to condition or change in health will improve Outcome: Progressing   Problem: Health Behavior/Discharge Planning: Goal: Ability to identify and utilize available resources and services will improve Outcome: Progressing   Problem: Nutritional: Goal: Maintenance of adequate nutrition will improve Outcome: Progressing

## 2023-07-22 NOTE — Plan of Care (Signed)
  Problem: Education: Goal: Ability to describe self-care measures that may prevent or decrease complications (Diabetes Survival Skills Education) will improve Outcome: Progressing   Problem: Coping: Goal: Ability to adjust to condition or change in health will improve Outcome: Progressing   Problem: Fluid Volume: Goal: Ability to maintain a balanced intake and output will improve Outcome: Progressing   Problem: Health Behavior/Discharge Planning: Goal: Ability to identify and utilize available resources and services will improve Outcome: Progressing   Problem: Nutritional: Goal: Maintenance of adequate nutrition will improve Outcome: Progressing   Problem: Skin Integrity: Goal: Risk for impaired skin integrity will decrease Outcome: Progressing   Problem: Tissue Perfusion: Goal: Adequacy of tissue perfusion will improve Outcome: Progressing   Problem: Education: Goal: Knowledge of condition and prescribed therapy will improve Outcome: Progressing   Problem: Clinical Measurements: Goal: Ability to maintain clinical measurements within normal limits will improve Outcome: Progressing   Problem: Activity: Goal: Risk for activity intolerance will decrease Outcome: Progressing   Problem: Coping: Goal: Level of anxiety will decrease Outcome: Progressing   Problem: Elimination: Goal: Will not experience complications related to bowel motility Outcome: Progressing   Problem: Pain Managment: Goal: General experience of comfort will improve and/or be controlled Outcome: Progressing   Problem: Safety: Goal: Ability to remain free from injury will improve Outcome: Progressing   Problem: Skin Integrity: Goal: Risk for impaired skin integrity will decrease Outcome: Progressing   Problem: Education: Goal: Ability to describe self-care measures that may prevent or decrease complications (Diabetes Survival Skills Education) will improve Outcome: Progressing   Problem:  Coping: Goal: Ability to adjust to condition or change in health will improve Outcome: Progressing   Problem: Fluid Volume: Goal: Ability to maintain a balanced intake and output will improve Outcome: Progressing   Problem: Health Behavior/Discharge Planning: Goal: Ability to identify and utilize available resources and services will improve Outcome: Progressing   Problem: Nutritional: Goal: Maintenance of adequate nutrition will improve Outcome: Progressing   Problem: Skin Integrity: Goal: Risk for impaired skin integrity will decrease Outcome: Progressing   Problem: Tissue Perfusion: Goal: Adequacy of tissue perfusion will improve Outcome: Progressing   Problem: Education: Goal: Knowledge of condition and prescribed therapy will improve Outcome: Progressing   Problem: Clinical Measurements: Goal: Ability to maintain clinical measurements within normal limits will improve Outcome: Progressing   Problem: Activity: Goal: Risk for activity intolerance will decrease Outcome: Progressing   Problem: Coping: Goal: Level of anxiety will decrease Outcome: Progressing   Problem: Elimination: Goal: Will not experience complications related to bowel motility Outcome: Progressing   Problem: Pain Managment: Goal: General experience of comfort will improve and/or be controlled Outcome: Progressing   Problem: Safety: Goal: Ability to remain free from injury will improve Outcome: Progressing   Problem: Skin Integrity: Goal: Risk for impaired skin integrity will decrease Outcome: Progressing

## 2023-07-23 DIAGNOSIS — R55 Syncope and collapse: Secondary | ICD-10-CM | POA: Diagnosis not present

## 2023-07-23 DIAGNOSIS — Z89422 Acquired absence of other left toe(s): Secondary | ICD-10-CM | POA: Diagnosis not present

## 2023-07-23 DIAGNOSIS — D709 Neutropenia, unspecified: Secondary | ICD-10-CM | POA: Diagnosis present

## 2023-07-23 DIAGNOSIS — C9000 Multiple myeloma not having achieved remission: Secondary | ICD-10-CM | POA: Diagnosis present

## 2023-07-23 DIAGNOSIS — N1832 Chronic kidney disease, stage 3b: Secondary | ICD-10-CM | POA: Diagnosis present

## 2023-07-23 DIAGNOSIS — I951 Orthostatic hypotension: Secondary | ICD-10-CM | POA: Diagnosis present

## 2023-07-23 DIAGNOSIS — I480 Paroxysmal atrial fibrillation: Secondary | ICD-10-CM | POA: Diagnosis present

## 2023-07-23 DIAGNOSIS — E1040 Type 1 diabetes mellitus with diabetic neuropathy, unspecified: Secondary | ICD-10-CM | POA: Diagnosis present

## 2023-07-23 DIAGNOSIS — Z7952 Long term (current) use of systemic steroids: Secondary | ICD-10-CM | POA: Diagnosis not present

## 2023-07-23 DIAGNOSIS — E10649 Type 1 diabetes mellitus with hypoglycemia without coma: Secondary | ICD-10-CM | POA: Diagnosis present

## 2023-07-23 DIAGNOSIS — R9431 Abnormal electrocardiogram [ECG] [EKG]: Secondary | ICD-10-CM | POA: Diagnosis not present

## 2023-07-23 DIAGNOSIS — I129 Hypertensive chronic kidney disease with stage 1 through stage 4 chronic kidney disease, or unspecified chronic kidney disease: Secondary | ICD-10-CM | POA: Diagnosis present

## 2023-07-23 DIAGNOSIS — R531 Weakness: Secondary | ICD-10-CM | POA: Diagnosis present

## 2023-07-23 DIAGNOSIS — Z794 Long term (current) use of insulin: Secondary | ICD-10-CM | POA: Diagnosis not present

## 2023-07-23 DIAGNOSIS — Z87820 Personal history of traumatic brain injury: Secondary | ICD-10-CM | POA: Diagnosis not present

## 2023-07-23 DIAGNOSIS — G253 Myoclonus: Secondary | ICD-10-CM | POA: Diagnosis present

## 2023-07-23 DIAGNOSIS — G8929 Other chronic pain: Secondary | ICD-10-CM | POA: Diagnosis present

## 2023-07-23 DIAGNOSIS — D631 Anemia in chronic kidney disease: Secondary | ICD-10-CM | POA: Diagnosis present

## 2023-07-23 DIAGNOSIS — Z79899 Other long term (current) drug therapy: Secondary | ICD-10-CM | POA: Diagnosis not present

## 2023-07-23 DIAGNOSIS — R296 Repeated falls: Secondary | ICD-10-CM | POA: Diagnosis present

## 2023-07-23 DIAGNOSIS — E16A2 Hypoglycemia level 2: Secondary | ICD-10-CM | POA: Diagnosis present

## 2023-07-23 DIAGNOSIS — E7800 Pure hypercholesterolemia, unspecified: Secondary | ICD-10-CM | POA: Diagnosis present

## 2023-07-23 DIAGNOSIS — R42 Dizziness and giddiness: Secondary | ICD-10-CM | POA: Diagnosis not present

## 2023-07-23 DIAGNOSIS — I950 Idiopathic hypotension: Secondary | ICD-10-CM

## 2023-07-23 DIAGNOSIS — E1022 Type 1 diabetes mellitus with diabetic chronic kidney disease: Secondary | ICD-10-CM | POA: Diagnosis present

## 2023-07-23 DIAGNOSIS — Z7901 Long term (current) use of anticoagulants: Secondary | ICD-10-CM | POA: Diagnosis not present

## 2023-07-23 DIAGNOSIS — G901 Familial dysautonomia [Riley-Day]: Secondary | ICD-10-CM | POA: Diagnosis present

## 2023-07-23 DIAGNOSIS — Z8249 Family history of ischemic heart disease and other diseases of the circulatory system: Secondary | ICD-10-CM | POA: Diagnosis not present

## 2023-07-23 LAB — CBC
HCT: 31.8 % — ABNORMAL LOW (ref 39.0–52.0)
Hemoglobin: 10.5 g/dL — ABNORMAL LOW (ref 13.0–17.0)
MCH: 31.2 pg (ref 26.0–34.0)
MCHC: 33 g/dL (ref 30.0–36.0)
MCV: 94.4 fL (ref 80.0–100.0)
Platelets: 187 10*3/uL (ref 150–400)
RBC: 3.37 MIL/uL — ABNORMAL LOW (ref 4.22–5.81)
RDW: 15.6 % — ABNORMAL HIGH (ref 11.5–15.5)
WBC: 3 10*3/uL — ABNORMAL LOW (ref 4.0–10.5)
nRBC: 0 % (ref 0.0–0.2)

## 2023-07-23 LAB — GLUCOSE, CAPILLARY
Glucose-Capillary: 330 mg/dL — ABNORMAL HIGH (ref 70–99)
Glucose-Capillary: 365 mg/dL — ABNORMAL HIGH (ref 70–99)
Glucose-Capillary: 366 mg/dL — ABNORMAL HIGH (ref 70–99)
Glucose-Capillary: 197 mg/dL — ABNORMAL HIGH (ref 70–99)
Glucose-Capillary: 58 mg/dL — ABNORMAL LOW (ref 70–99)
Glucose-Capillary: 58 mg/dL — ABNORMAL LOW (ref 70–99)
Glucose-Capillary: 74 mg/dL (ref 70–99)
Glucose-Capillary: 254 mg/dL — ABNORMAL HIGH (ref 70–99)

## 2023-07-23 LAB — BASIC METABOLIC PANEL WITH GFR
Anion gap: 7 (ref 5–15)
BUN: 28 mg/dL — ABNORMAL HIGH (ref 8–23)
CO2: 22 mmol/L (ref 22–32)
Calcium: 8.3 mg/dL — ABNORMAL LOW (ref 8.9–10.3)
Chloride: 104 mmol/L (ref 98–111)
Creatinine, Ser: 1.24 mg/dL (ref 0.61–1.24)
GFR, Estimated: >60 mL/min (ref >60)
Glucose, Bld: 321 mg/dL — ABNORMAL HIGH (ref 70–99)
Potassium: 4.4 mmol/L (ref 3.5–5.1)
Sodium: 133 mmol/L — ABNORMAL LOW (ref 135–145)

## 2023-07-23 MED ORDER — SODIUM CHLORIDE 0.9 % IV BOLUS
500.0000 mL | Freq: Once | INTRAVENOUS | Status: AC
  Administered 2023-07-23: 500 mL via INTRAVENOUS

## 2023-07-23 MED ORDER — HYDRALAZINE HCL 20 MG/ML IJ SOLN
10.0000 mg | Freq: Once | INTRAMUSCULAR | Status: AC
  Administered 2023-07-23: 10 mg via INTRAVENOUS
  Filled 2023-07-23: qty 1

## 2023-07-23 MED ORDER — INSULIN GLARGINE-YFGN 100 UNIT/ML ~~LOC~~ SOLN
5.0000 [IU] | Freq: Two times a day (BID) | SUBCUTANEOUS | Status: DC
  Administered 2023-07-24 – 2023-07-26 (×6): 5 [IU] via SUBCUTANEOUS
  Filled 2023-07-23 (×8): qty 0.05

## 2023-07-23 NOTE — Plan of Care (Signed)
  Problem: Education: Goal: Ability to describe self-care measures that may prevent or decrease complications (Diabetes Survival Skills Education) will improve Outcome: Progressing Goal: Individualized Educational Video(s) Outcome: Progressing   Problem: Coping: Goal: Ability to adjust to condition or change in health will improve Outcome: Progressing   Problem: Fluid Volume: Goal: Ability to maintain a balanced intake and output will improve Outcome: Progressing   Problem: Health Behavior/Discharge Planning: Goal: Ability to identify and utilize available resources and services will improve Outcome: Progressing Goal: Ability to manage health-related needs will improve Outcome: Progressing   Problem: Metabolic: Goal: Ability to maintain appropriate glucose levels will improve Outcome: Progressing   Problem: Nutritional: Goal: Maintenance of adequate nutrition will improve Outcome: Progressing Goal: Progress toward achieving an optimal weight will improve Outcome: Progressing   Problem: Skin Integrity: Goal: Risk for impaired skin integrity will decrease Outcome: Progressing   Problem: Tissue Perfusion: Goal: Adequacy of tissue perfusion will improve Outcome: Progressing   Problem: Education: Goal: Knowledge of condition and prescribed therapy will improve Outcome: Progressing   Problem: Cardiac: Goal: Will achieve and/or maintain adequate cardiac output Outcome: Progressing   Problem: Physical Regulation: Goal: Complications related to the disease process, condition or treatment will be avoided or minimized Outcome: Progressing   Problem: Education: Goal: Knowledge of General Education information will improve Description: Including pain rating scale, medication(s)/side effects and non-pharmacologic comfort measures Outcome: Progressing   Problem: Health Behavior/Discharge Planning: Goal: Ability to manage health-related needs will improve Outcome:  Progressing   Problem: Clinical Measurements: Goal: Ability to maintain clinical measurements within normal limits will improve Outcome: Progressing Goal: Will remain free from infection Outcome: Progressing Goal: Diagnostic test results will improve Outcome: Progressing Goal: Respiratory complications will improve Outcome: Progressing Goal: Cardiovascular complication will be avoided Outcome: Progressing   Problem: Activity: Goal: Risk for activity intolerance will decrease Outcome: Progressing   Problem: Nutrition: Goal: Adequate nutrition will be maintained Outcome: Progressing   Problem: Coping: Goal: Level of anxiety will decrease Outcome: Progressing   Problem: Elimination: Goal: Will not experience complications related to bowel motility Outcome: Progressing Goal: Will not experience complications related to urinary retention Outcome: Progressing   Problem: Pain Managment: Goal: General experience of comfort will improve and/or be controlled Outcome: Progressing   Problem: Safety: Goal: Ability to remain free from injury will improve Outcome: Progressing   Problem: Skin Integrity: Goal: Risk for impaired skin integrity will decrease Outcome: Progressing

## 2023-07-23 NOTE — Progress Notes (Signed)
 Per Dr Clide Dales, dc tele monitoring

## 2023-07-23 NOTE — Inpatient Diabetes Management (Addendum)
 Inpatient Diabetes Program Recommendations  AACE/ADA: New Consensus Statement on Inpatient Glycemic Control (2015)  Target Ranges:  Prepandial:   less than 140 mg/dL      Peak postprandial:   less than 180 mg/dL (1-2 hours)      Critically ill patients:  140 - 180 mg/dL   Lab Results  Component Value Date   GLUCAP 366 (H) 07/23/2023   HGBA1C 10.0 (H) 07/21/2023    Review of Glycemic Control  Latest Reference Range & Units 07/22/23 09:48 07/22/23 12:49 07/22/23 15:16 07/22/23 17:06 07/22/23 20:46 07/23/23 06:28 07/23/23 06:29 07/23/23 08:20  Glucose-Capillary 70 - 99 mg/dL 161 (H) 096 (H) 045 (H) 173 (H) 183 (H) 330 (H) 365 (H) 366 (H)   Diabetes history: DM 1 Outpatient Diabetes medications:  Insulin  pump at home (T-slim) Basal rate 12am 0.25 units/hr (24hr = 8 units) Bolus settings ISF 60 I:C ratio 20 Target 120  Off pump regimen: Novolog  1-9 units tid with meals  Tresiba  5 units daily Current orders for Inpatient glycemic control:  Novolog  0-9 units tid with meals and HS Semglee  5 units daily Inpatient Diabetes Program Recommendations:   Consider increasing Semglee  to 5 units bid.   Addendum 12:00p- Spoke with patient and wife.  They state that insulin  pump is doing better.  Per RN , it was removed yesterday.  Patient has very little appetite.  Blood sugar down to 197 mg/dL at lunch.  Encouraged patient to eat.  Also discussed with wife that SQ insulin  will be used while in the hospital.  After d/c she can resume pump (20-24 hours after last dose of basal insulin ).  She verbalized understanding.   Thanks,  Josefa Ni, RN, BC-ADM Inpatient Diabetes Coordinator Pager [phone]  (8a-5p)

## 2023-07-23 NOTE — Progress Notes (Signed)
 Patient's CBG 58 @ 1707 gave 4oz orange juice. CBG 58 again at 1723, gave 4 oz orange juice a second time and an Ensure. Patient's CBG tested again  at 1743 and it came up to 74. Patient has not had much of an appetite today. Encouraging patient to eat and drink.

## 2023-07-23 NOTE — Evaluation (Signed)
 Occupational Therapy Evaluation Patient Details Name: Nicholas Weiss MRN: [id number] DOB: 1950-12-27 Today's Date: 07/23/2023   History of Present Illness   Pt is a 73 year old male admitted due to multiple recent falls, presyncope and inability of caregiver to continue caring for him.     PMH significant for hypertension, prior CVA, multiple myeloma, CKD 3B, paroxysmal A-fib, type 1 diabetes mellitus, A-fib on Eliquis , myoclonus on Keppra , dysautonomia/orthostatic hypotension, with recurrent syncope, frequent falls     Clinical Impressions Chart reviewed, pt greeted semi supine in bed, lethargic and requiring increased time for one step direction following, slightly improved with position changes. Pt is a ?historian at this time, but per chart lives with family. Pt reports he has assist for ADL/IADL from wife, amb with RW (with a  fall history), will need to confirm. Pt presents with deficits in endurance, activity tolerance, balance, cognition affecting safe and optimal ADL completion. Bed mobility completed with supervision, STS with MIN A +2, poor standing tolerance due to feeling "weak". Pt is orthostatic with BP in standing 65/48 (MAP 54) HR 82 bpm with TED Hose on. SET UP required for feeding tasks, MAX A for LB dressing (he reports his wife does this)Pt is performing Adl below PLOF, will benefit from acute OT to address functional deficits.  Pt is left as received, all needs met. OT will follow acutely.      If plan is discharge home, recommend the following:   A lot of help with walking and/or transfers;A little help with bathing/dressing/bathroom;Assistance with cooking/housework;Supervision due to cognitive status     Functional Status Assessment   Patient has had a recent decline in their functional status and demonstrates the ability to make significant improvements in function in a reasonable and predictable amount of time.     Equipment Recommendations   Wheelchair  cushion (measurements OT)     Recommendations for Other Services         Precautions/Restrictions   Precautions Precautions: Fall Recall of Precautions/Restrictions: Impaired Precaution/Restrictions Comments: orthostatic Restrictions Weight Bearing Restrictions Per Provider Order: No     Mobility Bed Mobility Overal bed mobility: Needs Assistance Bed Mobility: Supine to Sit, Sit to Supine     Supine to sit: Supervision, Used rails, HOB elevated Sit to supine: Supervision, Used rails   General bed mobility comments: intermittent vcs for technique    Transfers Overall transfer level: Needs assistance Equipment used: Rolling walker (2 wheels) Transfers: Sit to/from Stand Sit to Stand: Min assist, +2 safety/equipment           General transfer comment: pt is unable to tolerate upright standing for more than 15 seconds, says he feels weak and sits down      Balance Overall balance assessment: Needs assistance Sitting-balance support: Feet supported Sitting balance-Leahy Scale: Fair     Standing balance support: Bilateral upper extremity supported, During functional activity, Reliant on assistive device for balance Standing balance-Leahy Scale: Fair                             ADL either performed or assessed with clinical judgement   ADL Overall ADL's : Needs assistance/impaired                     Lower Body Dressing: Maximal assistance Lower Body Dressing Details (indicate cue type and reason): TED hose, socks sitting on edge of bed  Vision Patient Visual Report: No change from baseline       Perception         Praxis         Pertinent Vitals/Pain Pain Assessment Pain Assessment: No/denies pain     Extremity/Trunk Assessment Upper Extremity Assessment Upper Extremity Assessment: Overall WFL for tasks assessed   Lower Extremity Assessment Lower Extremity Assessment: Overall WFL for tasks  assessed       Communication Communication Communication: No apparent difficulties   Cognition Arousal: Lethargic Behavior During Therapy: Flat affect Cognition: Cognition impaired         Attention impairment (select first level of impairment): Selective attention Executive functioning impairment (select all impairments): Problem solving, Reasoning                   Following commands: Impaired Following commands impaired: Follows one step commands with increased time     Cueing  General Comments   Cueing Techniques: Verbal cues;Gestural cues;Tactile cues;Visual cues  pt is orthostatic, please refer to flow sheet   Exercises Other Exercises Other Exercises: edu pt re: role of OT, role of rehab, safe ADL performance   Shoulder Instructions      Home Living Family/patient expects to be discharged to:: Private residence Living Arrangements: Spouse/significant other;Children Available Help at Discharge: Family Type of Home: House Home Access: Stairs to enter Secretary/administrator of Steps: 1 Entrance Stairs-Rails: Can reach both Home Layout: One level     Bathroom Shower/Tub: Chief Strategy Officer: Standard     Home Equipment: Agricultural consultant (2 wheels);Cane - quad;Shower seat;Grab bars - toilet;Grab bars - tub/shower   Additional Comments: pt is a poor report at this time, information gained from previous admission/chart; Will need to follow up      Prior Functioning/Environment Prior Level of Function : Needs assist;History of Falls (last six months)             Mobility Comments: pt reports he amb with RW ADLs Comments: pt reports his wife assists with ADLs/IADLs, will need to confirm    OT Problem List: Decreased activity tolerance;Impaired balance (sitting and/or standing);Decreased safety awareness;Decreased knowledge of use of DME or AE   OT Treatment/Interventions: Self-care/ADL training;Balance training;Therapeutic  exercise;Therapeutic activities;Cognitive remediation/compensation;DME and/or AE instruction;Patient/family education      OT Goals(Current goals can be found in the care plan section)   Acute Rehab OT Goals Patient Stated Goal: feel stronger OT Goal Formulation: With patient Time For Goal Achievement: 08/06/23 Potential to Achieve Goals: Good ADL Goals Pt Will Perform Grooming: with supervision;sitting Pt Will Perform Lower Body Dressing: with mod assist;sitting/lateral leans;sit to/from stand Pt Will Transfer to Toilet: with supervision;ambulating Pt Will Perform Toileting - Clothing Manipulation and hygiene: with contact guard assist;sitting/lateral leans;sit to/from stand   OT Frequency:  Min 2X/week    Co-evaluation              AM-PAC OT "6 Clicks" Daily Activity     Outcome Measure Help from another person eating meals?: None Help from another person taking care of personal grooming?: None Help from another person toileting, which includes using toliet, bedpan, or urinal?: A Lot Help from another person bathing (including washing, rinsing, drying)?: A Lot Help from another person to put on and taking off regular upper body clothing?: None Help from another person to put on and taking off regular lower body clothing?: A Lot 6 Click Score: 18   End of Session Equipment Utilized During Treatment: Rolling  walker (2 wheels);Gait belt Nurse Communication: Mobility status;Other (comment) (vitals)  Activity Tolerance: Treatment limited secondary to medical complications (Comment) Patient left: in bed;with call bell/phone within reach;with bed alarm set  OT Visit Diagnosis: Unsteadiness on feet (R26.81);Other abnormalities of gait and mobility (R26.89)                Time: 1448-1510 OT Time Calculation (min): 22 min Charges:  OT General Charges $OT Visit: 1 Visit OT Evaluation $OT Eval Moderate Complexity: 1 Mod  Gerre Kraft, OTD OTR/L  07/23/23, 3:37 PM

## 2023-07-23 NOTE — Progress Notes (Signed)
 Progress Note   Patient: Nicholas Weiss MVH:[id number] DOB: [birth year] DOA: 07/21/2023     0 DOS: the patient was seen and examined on 07/23/2023   Brief hospital course: Nicholas Weiss is a 73 y.o. male with medical history significant for hypertension, prior CVA, multiple myeloma, CKD 3B, paroxysmal A-fib, type 1 diabetes mellitus, A-fib on Eliquis , myoclonus on Keppra , dysautonomia/orthostatic hypotension, with recurrent syncope, frequent falls being admitted due to multiple recent falls, presyncope and inability of caregiver to continue caring for him.   Assessment and Plan: Frequent falls Severe Orthostatic hypotension/ dysautonomia History of injury (brain bleed, rib fractures) secondary to recurrent syncope with collapse Patient had full syncope workup during prior hospitalizations with the following results: - CTA head and neck 01/2023 with 50% stenosis in the proximal bilateral ICAs, just distal to the bifurcations.Severe stenosis at the origin of the right vertebral artery -Echo 02/2023 with EF 65 to 70%, G1 DD, no significant valvulopathy BP very labile, this AM elevated SBP > 180, got hydralazine  dropped to 60s. Fluid bolus ordered. Continue TED hose. Continue continue Florinef . He is not on midodrine  at home. Fall precautions and up with assistance Cardiac monitoring PT/ OT evaluation once BP improved. TOC consult for placement.   Type 1 diabetes Sugars stable. Semglee  dose increased to 5 units BID with sliding scale coverage. RN notified that his insulin  pump is removed. Diabetes educator on board.   CKD 3B Creatinine at baseline Monitor renal function and avoid nephrotoxins   Atrial fibrillation (HCC) Chronic anticoagulation Continue Eliquis  Not on beta-blocker due to chronic hypotension   Prolonged QT Avoid QT prolonging drugs.  Myoclonus Continue Keppra    Anemia of chronic kidney failure, stage 3 (moderate) (HCC) Hemoglobin at baseline. No active  bleeding.    Essential (primary) hypertension BP stable.  Hold hydralazine  for now.   Multiple myeloma (HCC) No acute issues suspected      Out of bed to chair. Incentive spirometry. Nursing supportive care. Fall, aspiration precautions. Diet:  Diet Orders (From admission, onward)     Start     Ordered   07/21/23 2101  Diet heart healthy/carb modified Room service appropriate? Yes; Fluid consistency: Thin  Diet effective now       Question Answer Comment  Diet-HS Snack? Nothing   Room service appropriate? Yes   Fluid consistency: Thin      07/21/23 2101           DVT prophylaxis: Place TED hose Start: 07/21/23 2118 apixaban  (ELIQUIS ) tablet 5 mg  Level of care: Telemetry Medical   Code Status: Full Code  Subjective: Patient is seen and examined today morning. More sleepy per RN. He is arousable, denies any complaints. BP low noted, got hydralazine  this AM.   Physical Exam: Vitals:   07/23/23 0909 07/23/23 0912 07/23/23 0915 07/23/23 0955  BP: (!) 64/40 (!) 64/56 (!) 66/51 (!) 81/62  Pulse: 94   88  Resp: 16     Temp: 98.3 F (36.8 C)     TempSrc:      SpO2: 98%   98%  Weight:      Height:        General - Elderly African-American male, no apparent distress HEENT - PERRLA, EOMI, atraumatic head, non tender sinuses. Lung - Clear, no rales, rhonchi, wheezes. Heart - S1, S2 heard, no murmurs, rubs, trace pedal edema. Abdomen - Soft, non tender, bowel sounds good Neuro - Sleeping, arousable, able to answer me, non focal exam. Skin - Warm  and dry.  Data Reviewed:      Latest Ref Rng & Units 07/23/2023    5:56 AM 07/21/2023    4:40 PM 07/16/2023   11:39 AM  CBC  WBC 4.0 - 10.5 K/uL 3.0  2.2  3.0   Hemoglobin 13.0 - 17.0 g/dL 16.1  09.6  04.5   Hematocrit 39.0 - 52.0 % 31.8  34.1  31.6   Platelets 150 - 400 K/uL 187  241  250       Latest Ref Rng & Units 07/23/2023    5:56 AM 07/21/2023    4:40 PM 07/16/2023   11:39 AM  BMP  Glucose 70 - 99 mg/dL  409  811  914   BUN 8 - 23 mg/dL 28  34  31   Creatinine 0.61 - 1.24 mg/dL 7.82  9.56  2.13   Sodium 135 - 145 mmol/L 133  140  133   Potassium 3.5 - 5.1 mmol/L 4.4  3.9  4.0   Chloride 98 - 111 mmol/L 104  107  98   CO2 22 - 32 mmol/L 22  27  27    Calcium  8.9 - 10.3 mg/dL 8.3  8.6  8.8    CT Cervical Spine Wo Contrast Result Date: 07/21/2023 CLINICAL DATA:  Neck trauma.  Fall. EXAM: CT CERVICAL SPINE WITHOUT CONTRAST TECHNIQUE: Multidetector CT imaging of the cervical spine was performed without intravenous contrast. Multiplanar CT image reconstructions were also generated. RADIATION DOSE REDUCTION: This exam was performed according to the departmental dose-optimization program which includes automated exposure control, adjustment of the mA and/or kV according to patient size and/or use of iterative reconstruction technique. COMPARISON:  CT of the cervical spine 05/18/2023. FINDINGS: Alignment: Slight degenerative retrolisthesis is again noted at C5-6 and C6-7. No other significant listhesis is present. Straightening of the normal cervical lordosis is present. Skull base and vertebrae: The craniocervical junction is normal. The vertebral body heights are normal. No acute fracture is present. Soft tissues and spinal canal: No prevertebral fluid or swelling. No visible canal hematoma. Atherosclerotic calcifications are present at the carotid bifurcations bilaterally with left greater than right proximal ICA stenosis. Disc levels: Facet hypertrophy and uncovertebral spurring result in moderate left foraminal stenosis at C3-4 and C4-5. Moderate foraminal narrowing is present bilaterally, left greater than right at C5-6 and C6-7. No significant interval change is present. Upper chest: The lung apices are clear. The thoracic inlet is within normal limits. IMPRESSION: 1. No acute fracture or traumatic subluxation. 2. Multilevel degenerative changes of the cervical spine as described. 3. Left greater than right  proximal ICA stenosis. Electronically Signed   By: Audree Leas M.D.   On: 07/21/2023 17:14   CT Head Wo Contrast Result Date: 07/21/2023 CLINICAL DATA:  Fall. Patient is on blood thinners. Trauma to head. No loss of consciousness. EXAM: CT HEAD WITHOUT CONTRAST TECHNIQUE: Contiguous axial images were obtained from the base of the skull through the vertex without intravenous contrast. RADIATION DOSE REDUCTION: This exam was performed according to the departmental dose-optimization program which includes automated exposure control, adjustment of the mA and/or kV according to patient size and/or use of iterative reconstruction technique. COMPARISON:  CT head without contrast 05/18/2023. FINDINGS: Brain: Extensive calcifications are again noted along the inter cerebral falx and tentorium. No acute infarct, hemorrhage, or mass lesion is present. Periventricular and subcortical white matter changes are mildly advanced for age, similar the prior study. Deep brain nuclei are within normal limits. The brainstem and cerebellum  are within normal limits. Midline structures are within normal limits. Vascular: Atherosclerotic calcifications are present within the cavernous internal carotid arteries bilaterally. No hyperdense vessel is present. Skull: Calvarium is intact. No focal lytic or blastic lesions are present. No significant extracranial soft tissue lesion is present. Sinuses/Orbits: The paranasal sinuses and mastoid air cells are clear. The globes and orbits are within normal limits. IMPRESSION: 1. No acute intracranial abnormality or significant interval change. 2. Periventricular and subcortical white matter changes are mildly advanced for age, similar the prior study. This likely reflects the sequela of chronic microvascular ischemia. Electronically Signed   By: Audree Leas M.D.   On: 07/21/2023 17:11    Family Communication: Discussed with patient, he understand and agree. All questions  answered.  Disposition: Status is: Observation The patient will require care spanning > 2 midnights and should be moved to inpatient because: hypotension, IV fluid resuscitation, PT eval.  Planned Discharge Destination: Rehab     Time spent: 39 minutes  Author: Aisha Hove, MD 07/23/2023 10:02 AM Secure chat 7am to 7pm For on call review [email or web address].

## 2023-07-23 NOTE — Progress Notes (Signed)
 OT Cancellation Note  Patient Details Name: Nicholas Weiss MRN: [id number] DOB: 1951-01-03   Cancelled Treatment:    Reason Eval/Treat Not Completed: Other (comment);Patient not medically ready (BP recorded at 9:15 is 66/51; OT will hold at this time, follow up at a later time as able.Gerre Kraft, OTD OTR/L  07/23/23, 9:33 AM

## 2023-07-24 DIAGNOSIS — R55 Syncope and collapse: Secondary | ICD-10-CM | POA: Diagnosis not present

## 2023-07-24 DIAGNOSIS — R42 Dizziness and giddiness: Secondary | ICD-10-CM | POA: Diagnosis not present

## 2023-07-24 LAB — CBC
HCT: 27.8 % — ABNORMAL LOW (ref 39.0–52.0)
Hemoglobin: 9.4 g/dL — ABNORMAL LOW (ref 13.0–17.0)
MCH: 30.8 pg (ref 26.0–34.0)
MCHC: 33.8 g/dL (ref 30.0–36.0)
MCV: 91.1 fL (ref 80.0–100.0)
Platelets: 179 10*3/uL (ref 150–400)
RBC: 3.05 MIL/uL — ABNORMAL LOW (ref 4.22–5.81)
RDW: 15.2 % (ref 11.5–15.5)
WBC: 2.7 10*3/uL — ABNORMAL LOW (ref 4.0–10.5)
nRBC: 0 % (ref 0.0–0.2)

## 2023-07-24 LAB — BASIC METABOLIC PANEL WITH GFR
Anion gap: 7 (ref 5–15)
BUN: 33 mg/dL — ABNORMAL HIGH (ref 8–23)
CO2: 25 mmol/L (ref 22–32)
Calcium: 8 mg/dL — ABNORMAL LOW (ref 8.9–10.3)
Chloride: 104 mmol/L (ref 98–111)
Creatinine, Ser: 1.6 mg/dL — ABNORMAL HIGH (ref 0.61–1.24)
GFR, Estimated: 46 mL/min — ABNORMAL LOW (ref >60)
Glucose, Bld: 145 mg/dL — ABNORMAL HIGH (ref 70–99)
Potassium: 4.3 mmol/L (ref 3.5–5.1)
Sodium: 136 mmol/L (ref 135–145)

## 2023-07-24 LAB — MAGNESIUM: Magnesium: 1.7 mg/dL (ref 1.7–2.4)

## 2023-07-24 LAB — GLUCOSE, CAPILLARY
Glucose-Capillary: 140 mg/dL — ABNORMAL HIGH (ref 70–99)
Glucose-Capillary: 229 mg/dL — ABNORMAL HIGH (ref 70–99)
Glucose-Capillary: 210 mg/dL — ABNORMAL HIGH (ref 70–99)
Glucose-Capillary: 125 mg/dL — ABNORMAL HIGH (ref 70–99)
Glucose-Capillary: 106 mg/dL — ABNORMAL HIGH (ref 70–99)

## 2023-07-24 LAB — PHOSPHORUS: Phosphorus: 2.9 mg/dL (ref 2.5–4.6)

## 2023-07-24 NOTE — Plan of Care (Signed)
  Problem: Education: Goal: Ability to describe self-care measures that may prevent or decrease complications (Diabetes Survival Skills Education) will improve Outcome: Progressing   Problem: Coping: Goal: Ability to adjust to condition or change in health will improve Outcome: Progressing

## 2023-07-24 NOTE — Evaluation (Signed)
 Physical Therapy Evaluation Patient Details Name: Nicholas Weiss MRN: [id number] DOB: Feb 15, 1951 Today's Date: 07/24/2023  History of Present Illness  Pt is a 73 year old male admitted due to multiple recent falls, presyncope and inability of caregiver to continue caring for him.     PMH significant for hypertension, prior CVA, multiple myeloma, CKD 3B, paroxysmal A-fib, type 1 diabetes mellitus, A-fib on Eliquis , myoclonus on Keppra , dysautonomia/orthostatic hypotension, with recurrent syncope, frequent falls  Clinical Impression  73 yo Male admitted with recent falls related to orthostatic hypotension. Pt lives at home with his girlfriend and son. He reports that someone is available to assist him 24/7. He denies any dizziness during session but reports feeling foggy. Patient did exhibit drop in BP with mobility, see flowsheet. He requires supervision for bed mobility, min A/CGA for transfers. He was able to walk around room short distance with wide base of support, reciprocal steps x12 feet with increased fatigue. Pt would benefit from skilled PT intervention to improve strength and mobility.         If plan is discharge home, recommend the following: A little help with walking and/or transfers;A little help with bathing/dressing/bathroom;Assistance with cooking/housework;Assist for transportation;Help with stairs or ramp for entrance;Direct supervision/assist for medications management   Can travel by private vehicle        Equipment Recommendations None recommended by PT  Recommendations for Other Services       Functional Status Assessment Patient has had a recent decline in their functional status and demonstrates the ability to make significant improvements in function in a reasonable and predictable amount of time.     Precautions / Restrictions Precautions Precautions: Fall Recall of Precautions/Restrictions: Impaired Precaution/Restrictions Comments: orthostatic; reports >5  falls in last month Restrictions Weight Bearing Restrictions Per Provider Order: No      Mobility  Bed Mobility Overal bed mobility: Needs Assistance Bed Mobility: Supine to Sit, Sit to Supine     Supine to sit: Supervision, Used rails, HOB elevated Sit to supine: Supervision, Used rails   General bed mobility comments: intermittent vcs for technique; requires extra time    Transfers Overall transfer level: Needs assistance Equipment used: Straight cane, None Transfers: Sit to/from Stand Sit to Stand: Min assist, Contact guard assist           General transfer comment: Pt stood edge of bed initially reaching out for wall to steady himself. He can stand unsupported for 2-3 min with close supervision/CGA. Vitals monitored throughout; He continues to have drop in BP but denies dizziness, just reports feeling "Foggy"    Ambulation/Gait Ambulation/Gait assistance: Contact guard assist Gait Distance (Feet): 12 Feet Assistive device: Straight cane   Gait velocity: decreased     General Gait Details: Pt ambulates with wide base of support, reciprocal gait pattern, good foot clearance, short step length with slower gait speed; BP after walking was 68/52, MAP (58), HR 94; Pt denies any dizziness but does report feeling foggy  Stairs            Wheelchair Mobility     Tilt Bed    Modified Rankin (Stroke Patients Only)       Balance Overall balance assessment: Needs assistance Sitting-balance support: Feet supported Sitting balance-Leahy Scale: Fair Sitting balance - Comments: able to sit without loss of balance   Standing balance support: During functional activity, Reliant on assistive device for balance, Single extremity supported Standing balance-Leahy Scale: Fair Standing balance comment: pt ambulated with SPC; he will  often reach out to steady self when taking a step, either using AD or holding onto nearby furniture/wall.         Rhomberg - Eyes  Opened: 15 Rhomberg - Eyes Closed: 10 (demonstrates lateral sway but able to maintain balance)   High Level Balance Comments: unable to take a step unsupported, requires upper extremity support for dynamic movement; able to stand with feet apart, turns head minimally to look behind; hesitant to reach or lean outside base of support.             Pertinent Vitals/Pain Pain Assessment Pain Assessment: No/denies pain    Home Living Family/patient expects to be discharged to:: Private residence Living Arrangements: Spouse/significant other;Children (girlfriend and son) Available Help at Discharge: Family;Available 24 hours/day Type of Home: House Home Access: Stairs to enter Entrance Stairs-Rails: Can reach both Entrance Stairs-Number of Steps: 1   Home Layout: One level Home Equipment: Agricultural consultant (2 wheels);Cane - quad;Shower seat;Grab bars - toilet;Grab bars - tub/shower;Cane - single point Additional Comments: Pt reports he uses a regular wooden cane;    Prior Function Prior Level of Function : Needs assist;History of Falls (last six months)             Mobility Comments: pt reports he amb with wooden cane ADLs Comments: pt reports his wife assists with ADLs/IADLs, significant other does the cooking; Pt is not currently driving;     Extremity/Trunk Assessment   Upper Extremity Assessment Upper Extremity Assessment: Overall WFL for tasks assessed    Lower Extremity Assessment Lower Extremity Assessment: Overall WFL for tasks assessed    Cervical / Trunk Assessment Cervical / Trunk Assessment: Normal  Communication   Communication Communication: No apparent difficulties    Cognition Arousal: Lethargic Behavior During Therapy: Flat affect                           PT - Cognition Comments: Pt oriented to birthday, place, situation, oriented to current date; Following commands: Impaired Following commands impaired: Follows one step commands with  increased time     Cueing Cueing Techniques: Verbal cues, Gestural cues, Tactile cues, Visual cues     General Comments General comments (skin integrity, edema, etc.): Pt is orthostatic; see flow sheet for recent vitals taken (09:24)    Exercises Other Exercises Other Exercises: Instructed patient in seated upper extremity movement to help increase BP upon sitting; Pt reports feeling mildly better; Educated patient to move slowly when chaging positions to help reduce drop in BP Other Exercises: educated patient in role of PT and recommendations;   Assessment/Plan    PT Assessment Patient needs continued PT services  PT Problem List Cardiopulmonary status limiting activity;Decreased activity tolerance;Decreased balance;Decreased mobility;Decreased safety awareness       PT Treatment Interventions DME instruction;Balance training;Gait training;Stair training;Functional mobility training;Patient/family education;Therapeutic activities;Therapeutic exercise    PT Goals (Current goals can be found in the Care Plan section)  Acute Rehab PT Goals Patient Stated Goal: to go home. PT Goal Formulation: With patient Time For Goal Achievement: 08/07/23 Potential to Achieve Goals: Fair    Frequency Min 1X/week     Co-evaluation               AM-PAC PT "6 Clicks" Mobility  Outcome Measure Help needed turning from your back to your side while in a flat bed without using bedrails?: None Help needed moving from lying on your back to sitting on the side  of a flat bed without using bedrails?: None Help needed moving to and from a bed to a chair (including a wheelchair)?: A Little Help needed standing up from a chair using your arms (e.g., wheelchair or bedside chair)?: A Little Help needed to walk in hospital room?: A Little Help needed climbing 3-5 steps with a railing? : A Lot 6 Click Score: 19    End of Session Equipment Utilized During Treatment: Gait belt Activity Tolerance:  Other (comment) (limited by drop in BP) Patient left: in bed (needs in reach) Nurse Communication: Mobility status PT Visit Diagnosis: Unsteadiness on feet (R26.81)    Time: 1610-9604 PT Time Calculation (min) (ACUTE ONLY): 31 min   Charges:   PT Evaluation $PT Eval Low Complexity: 1 Low   PT General Charges $$ ACUTE PT VISIT: 1 Visit          Ashleen Demma PT, DPT 07/24/2023, 5:34 PM

## 2023-07-24 NOTE — Progress Notes (Signed)
 Progress Note   Patient: Nicholas Weiss:[id number] DOB: 11/30/1950 DOA: 07/21/2023     1 DOS: the patient was seen and examined on 07/24/2023   Brief hospital course: Nicholas Weiss is a 73 y.o. male with medical history significant for hypertension, prior CVA, multiple myeloma, CKD 3B, paroxysmal A-fib, type 1 diabetes mellitus, A-fib on Eliquis , myoclonus on Keppra , dysautonomia/orthostatic hypotension, with recurrent syncope, frequent falls being admitted due to multiple recent falls, presyncope and inability of caregiver to continue caring for him.   Assessment and Plan:  # Frequent falls # Severe Orthostatic hypotension/ dysautonomia # History of injury (brain bleed, rib fractures) secondary to recurrent syncope with collapse Patient had full syncope workup during prior hospitalizations with the following results: - CTA head and neck 01/2023 with 50% stenosis in the proximal bilateral ICAs, just distal to the bifurcations.Severe stenosis at the origin of the right vertebral artery -Echo 02/2023 with EF 65 to 70%, G1 DD, no significant valvulopathy BP very labile, this AM elevated SBP > 180, got hydralazine  dropped to 60s. Fluid bolus ordered. Continue TED hose. Continue continue Florinef . He is not on midodrine  at home. Fall precautions and up with assistance Cardiac monitoring PT/ OT evaluation once BP improved. TOC consult for placement.   # Type 1 diabetes Sugars stable. Semglee  dose increased to 5 units BID with sliding scale coverage. RN notified that his insulin  pump is removed. Diabetes educator on board.   # CKD 3B Creatinine at baseline Monitor renal function and avoid nephrotoxins   # Atrial fibrillation # Chronic anticoagulation Continue Eliquis  Not on beta-blocker due to chronic hypotension   # Prolonged QT Avoid QT prolonging drugs.  # Myoclonus Continue Keppra    # Anemia of chronic kidney failure, stage 3 (moderate)  Hemoglobin at baseline. No  active bleeding.    Leukopenia, continue to monitor CBC Check differential   # Essential (primary) hypertension BP stable.  Hold hydralazine  for now.   # Multiple myeloma  No acute issues suspected      Out of bed to chair. Incentive spirometry. Nursing supportive care. Fall, aspiration precautions. Diet:  Diet Orders (From admission, onward)     Start     Ordered   07/21/23 2101  Diet heart healthy/carb modified Room service appropriate? Yes; Fluid consistency: Thin  Diet effective now       Question Answer Comment  Diet-HS Snack? Nothing   Room service appropriate? Yes   Fluid consistency: Thin      07/21/23 2101           DVT prophylaxis: Place TED hose Start: 07/21/23 2118 apixaban  (ELIQUIS ) tablet 5 mg  Level of care: Telemetry Medical   Code Status: Full Code  Subjective: Patient is seen and examined today morning.  Patient was sleepy, AO x 1 to 2, seems little confused.  Resting comfortably, denied any complaints.   Physical Exam: Vitals:   07/24/23 0410 07/24/23 0500 07/24/23 0825 07/24/23 1118  BP: 133/72  (!) 154/88 117/74  Pulse: 64  74 65  Resp: 16  18 18   Temp: 97.9 F (36.6 C)  98 F (36.7 C) 97.9 F (36.6 C)  TempSrc: Oral     SpO2: 100%  100% 100%  Weight:  67.1 kg    Height:        General - Elderly African-American male, no apparent distress HEENT - PERRLA, EOMI, atraumatic head, non tender sinuses. Lung - Clear, no rales, rhonchi, wheezes. Heart - S1, S2 heard, no murmurs,  rubs, trace pedal edema. Abdomen - Soft, non tender, bowel sounds good Neuro - Sleeping, arousable, able to answer me, non focal exam. Skin - Warm and dry.  Data Reviewed:      Latest Ref Rng & Units 07/24/2023    5:09 AM 07/23/2023    5:56 AM 07/21/2023    4:40 PM  CBC  WBC 4.0 - 10.5 K/uL 2.7  3.0  2.2   Hemoglobin 13.0 - 17.0 g/dL 9.4  16.1  09.6   Hematocrit 39.0 - 52.0 % 27.8  31.8  34.1   Platelets 150 - 400 K/uL 179  187  241       Latest Ref  Rng & Units 07/24/2023    5:09 AM 07/23/2023    5:56 AM 07/21/2023    4:40 PM  BMP  Glucose 70 - 99 mg/dL 045  409  811   BUN 8 - 23 mg/dL 33  28  34   Creatinine 0.61 - 1.24 mg/dL 9.14  7.82  9.56   Sodium 135 - 145 mmol/L 136  133  140   Potassium 3.5 - 5.1 mmol/L 4.3  4.4  3.9   Chloride 98 - 111 mmol/L 104  104  107   CO2 22 - 32 mmol/L 25  22  27    Calcium  8.9 - 10.3 mg/dL 8.0  8.3  8.6    No results found.   Family Communication: Discussed with patient, he understand and agree. All questions answered.  Disposition: Status is: Inpatient The patient will require care spanning > 2 midnights and should be moved to inpatient because: hypotension, IV fluid resuscitation, PT eval.  Planned Discharge Destination: Rehab     Time spent: 40 minutes  Author: Althia Atlas, MD 07/24/2023 3:26 PM Secure chat 7am to 7pm For on call review [email or web address].

## 2023-07-24 NOTE — Progress Notes (Signed)
 Patient removed telemetry monitor. Refused to reapply monitor. MD notified.

## 2023-07-25 DIAGNOSIS — R42 Dizziness and giddiness: Secondary | ICD-10-CM | POA: Diagnosis not present

## 2023-07-25 DIAGNOSIS — R55 Syncope and collapse: Secondary | ICD-10-CM | POA: Diagnosis not present

## 2023-07-25 LAB — BASIC METABOLIC PANEL WITH GFR
Anion gap: 6 (ref 5–15)
BUN: 28 mg/dL — ABNORMAL HIGH (ref 8–23)
CO2: 23 mmol/L (ref 22–32)
Calcium: 8.3 mg/dL — ABNORMAL LOW (ref 8.9–10.3)
Chloride: 105 mmol/L (ref 98–111)
Creatinine, Ser: 1.43 mg/dL — ABNORMAL HIGH (ref 0.61–1.24)
GFR, Estimated: 53 mL/min — ABNORMAL LOW (ref >60)
Glucose, Bld: 79 mg/dL (ref 70–99)
Potassium: 4.1 mmol/L (ref 3.5–5.1)
Sodium: 134 mmol/L — ABNORMAL LOW (ref 135–145)

## 2023-07-25 LAB — CBC WITH DIFFERENTIAL/PLATELET
Abs Immature Granulocytes: 0 10*3/uL (ref 0.00–0.07)
Basophils Absolute: 0 10*3/uL (ref 0.0–0.1)
Basophils Relative: 0 %
Eosinophils Absolute: 0.2 10*3/uL (ref 0.0–0.5)
Eosinophils Relative: 8 %
HCT: 30.8 % — ABNORMAL LOW (ref 39.0–52.0)
Hemoglobin: 10.2 g/dL — ABNORMAL LOW (ref 13.0–17.0)
Immature Granulocytes: 0 %
Lymphocytes Relative: 45 %
Lymphs Abs: 1.1 10*3/uL (ref 0.7–4.0)
MCH: 30.4 pg (ref 26.0–34.0)
MCHC: 33.1 g/dL (ref 30.0–36.0)
MCV: 91.9 fL (ref 80.0–100.0)
Monocytes Absolute: 0.5 10*3/uL (ref 0.1–1.0)
Monocytes Relative: 19 %
Neutro Abs: 0.7 10*3/uL — ABNORMAL LOW (ref 1.7–7.7)
Neutrophils Relative %: 28 %
Platelets: 184 10*3/uL (ref 150–400)
RBC: 3.35 MIL/uL — ABNORMAL LOW (ref 4.22–5.81)
RDW: 15.2 % (ref 11.5–15.5)
Smear Review: NORMAL
WBC: 2.5 10*3/uL — ABNORMAL LOW (ref 4.0–10.5)
nRBC: 0 % (ref 0.0–0.2)

## 2023-07-25 LAB — GLUCOSE, CAPILLARY
Glucose-Capillary: 162 mg/dL — ABNORMAL HIGH (ref 70–99)
Glucose-Capillary: 140 mg/dL — ABNORMAL HIGH (ref 70–99)
Glucose-Capillary: 236 mg/dL — ABNORMAL HIGH (ref 70–99)
Glucose-Capillary: 242 mg/dL — ABNORMAL HIGH (ref 70–99)
Glucose-Capillary: 346 mg/dL — ABNORMAL HIGH (ref 70–99)

## 2023-07-25 LAB — VITAMIN D 25 HYDROXY (VIT D DEFICIENCY, FRACTURES): Vit D, 25-Hydroxy: 40.63 ng/mL (ref 30–100)

## 2023-07-25 LAB — PHOSPHORUS: Phosphorus: 3.2 mg/dL (ref 2.5–4.6)

## 2023-07-25 LAB — MAGNESIUM: Magnesium: 1.8 mg/dL (ref 1.7–2.4)

## 2023-07-25 MED ORDER — ORAL CARE MOUTH RINSE: 15.0000 mL | OROMUCOSAL | Status: DC | PRN

## 2023-07-25 NOTE — TOC Progression Note (Signed)
 Transition of Care [facility]) - Progression Note    Patient Details  Name: Nicholas Weiss MRN: [id number] Date of Birth: 1950/07/21  [facility] CM/SW Contact  Alexandra Ice, RN Phone Number: 07/25/2023, 12:53 PM  Clinical Narrative:    Patient PASRR number [id number]   Expected Discharge Plan: Home/Self Care Barriers to Discharge: Continued Medical Work up  Expected Discharge Plan and Services   Discharge Planning Services: CM Consult   Living arrangements for the past 2 months: Single Family Home                                       Social Determinants of Health (SDOH) Interventions SDOH Screenings   Food Insecurity: No Food Insecurity (07/22/2023)  Housing: Low Risk  (07/22/2023)  Transportation Needs: No Transportation Needs (07/22/2023)  Utilities: Not At Risk (07/22/2023)  Alcohol Screen: Low Risk  (10/02/2020)  Depression (PHQ2-9): Medium Risk (10/02/2020)  Financial Resource Strain: Low Risk  (05/19/2023)   Received from [facility]  Physical Activity: Insufficiently Active (01/01/2020)  Social Connections: Patient Declined (07/22/2023)  Stress: No Stress Concern Present (01/01/2020)  Tobacco Use: Low Risk  (07/22/2023)  Recent Concern: Tobacco Use - Medium Risk (07/09/2023)   Received from [facility]    Readmission Risk Interventions    02/16/2023    3:56 PM 08/24/2022   10:57 AM 05/20/2022   11:41 AM  Readmission Risk Prevention Plan  Transportation Screening Complete Complete Complete  Medication Review (RN Care Manager) Complete Complete Complete  PCP or Specialist appointment within 3-5 days of discharge Complete Complete Complete  HRI or Home Care Consult Complete Complete Complete  SW Recovery Care/Counseling Consult Not Complete    Palliative Care Screening Not Applicable  Complete  Skilled Nursing Facility Complete Complete Complete

## 2023-07-25 NOTE — Progress Notes (Addendum)
 Progress Note   Patient: Nicholas Weiss:[id number] DOB: 1950/10/21 DOA: 07/21/2023     2 DOS: the patient was seen and examined on 07/25/2023   Brief hospital course: Nicholas Weiss is a 73 y.o. male with medical history significant for hypertension, prior CVA, multiple myeloma, CKD 3B, paroxysmal A-fib, type 1 diabetes mellitus, A-fib on Eliquis , myoclonus on Keppra , dysautonomia/orthostatic hypotension, with recurrent syncope, frequent falls being admitted due to multiple recent falls, presyncope and inability of caregiver to continue caring for him.   Assessment and Plan:  # Frequent falls # Severe Orthostatic hypotension/ dysautonomia # History of injury (brain bleed, rib fractures) secondary to recurrent syncope with collapse Patient had full syncope workup during prior hospitalizations with the following results: - CTA head and neck 01/2023 with 50% stenosis in the proximal bilateral ICAs, just distal to the bifurcations.Severe stenosis at the origin of the right vertebral artery -Echo 02/2023 with EF 65 to 70%, G1 DD, no significant valvulopathy BP very labile, this AM elevated SBP > 180, got hydralazine  dropped to 60s. Fluid bolus ordered. Continue TED hose. Continue continue Florinef . He is not on midodrine  at home. Fall precautions and up with assistance Cardiac monitoring PT/ OT evaluation once BP improved. TOC consult for placement.   # Type 1 diabetes Sugars stable. Semglee  dose increased to 5 units BID with sliding scale coverage. RN notified that his insulin  pump is removed. Diabetes educator on board.   # CKD 3B Creatinine at baseline Monitor renal function and avoid nephrotoxins   # Atrial fibrillation # Chronic anticoagulation Continue Eliquis  Not on beta-blocker due to chronic hypotension   # Prolonged QT Avoid QT prolonging drugs.  # Myoclonus Continue Keppra    # Anemia of chronic kidney failure, stage 3 (moderate)  Hemoglobin at baseline. No  active bleeding.    Leukopenia, and neutropenia, afebrile, monitor off antibiotics.   It could be secondary to h/o multiple myeloma Continue to monitor CBC   # Essential (primary) hypertension BP stable.  Hold hydralazine  for now.   # Multiple myeloma  No acute issues suspected    Out of bed to chair. Incentive spirometry. Nursing supportive care. Fall, aspiration precautions. Diet:  Diet Orders (From admission, onward)     Start     Ordered   07/21/23 2101  Diet heart healthy/carb modified Room service appropriate? Yes; Fluid consistency: Thin  Diet effective now       Question Answer Comment  Diet-HS Snack? Nothing   Room service appropriate? Yes   Fluid consistency: Thin      07/21/23 2101           DVT prophylaxis: Place TED hose Start: 07/21/23 2118 apixaban  (ELIQUIS ) tablet 5 mg  Level of care: Telemetry Medical   Code Status: Full Code  Subjective: Patient is seen and examined today morning.  Patient was resting comfortably, today he looks more awake and alert as compared to yesterday, AO x 2 to 3.  Denied any complaints.    Physical Exam: Vitals:   07/24/23 2025 07/25/23 0433 07/25/23 0451 07/25/23 0726  BP: 137/79 (!) 149/88  (!) 169/81  Pulse: 60 75  65  Resp: 15 15  16   Temp: 97.6 F (36.4 C) 98.4 F (36.9 C)  98.2 F (36.8 C)  TempSrc: Oral Oral  Oral  SpO2:  100%  100%  Weight:   65.4 kg   Height:        General - Elderly African-American male, no apparent distress HEENT -  PERRLA, EOMI, atraumatic head, non tender sinuses. Lung - Clear, no rales, rhonchi, wheezes. Heart - S1, S2 heard, no murmurs, rubs, trace pedal edema. Abdomen - Soft, non tender, bowel sounds good Neuro - Sleeping, arousable, able to answer me, non focal exam. Skin - Warm and dry.  Data Reviewed:      Latest Ref Rng & Units 07/25/2023    4:43 AM 07/24/2023    5:09 AM 07/23/2023    5:56 AM  CBC  WBC 4.0 - 10.5 K/uL 2.5  2.7  3.0   Hemoglobin 13.0 - 17.0 g/dL  04.5  9.4  40.9   Hematocrit 39.0 - 52.0 % 30.8  27.8  31.8   Platelets 150 - 400 K/uL 184  179  187       Latest Ref Rng & Units 07/25/2023    4:43 AM 07/24/2023    5:09 AM 07/23/2023    5:56 AM  BMP  Glucose 70 - 99 mg/dL 79  811  914   BUN 8 - 23 mg/dL 28  33  28   Creatinine 0.61 - 1.24 mg/dL 7.82  9.56  2.13   Sodium 135 - 145 mmol/L 134  136  133   Potassium 3.5 - 5.1 mmol/L 4.1  4.3  4.4   Chloride 98 - 111 mmol/L 105  104  104   CO2 22 - 32 mmol/L 23  25  22    Calcium  8.9 - 10.3 mg/dL 8.3  8.0  8.3    No results found.   Family Communication: Discussed with patient, he understand and agree. All questions answered.  Disposition: Status is: Inpatient The patient will require care spanning > 2 midnights and should be moved to inpatient because: hypotension, IV fluid resuscitation, PT eval. Planned Discharge Destination: Rehab   Time spent: 40 minutes  Author: Althia Atlas, MD 07/25/2023 1:55 PM Secure chat 7am to 7pm For on call review [email or web address].

## 2023-07-25 NOTE — Plan of Care (Signed)
  Problem: Health Behavior/Discharge Planning: Goal: Ability to identify and utilize available resources and services will improve Outcome: Progressing   Problem: Skin Integrity: Goal: Risk for impaired skin integrity will decrease Outcome: Progressing   Problem: Physical Regulation: Goal: Complications related to the disease process, condition or treatment will be avoided or minimized Outcome: Progressing

## 2023-07-26 DIAGNOSIS — R42 Dizziness and giddiness: Secondary | ICD-10-CM | POA: Diagnosis not present

## 2023-07-26 DIAGNOSIS — R55 Syncope and collapse: Secondary | ICD-10-CM | POA: Diagnosis not present

## 2023-07-26 LAB — BASIC METABOLIC PANEL WITH GFR
Anion gap: 7 (ref 5–15)
BUN: 28 mg/dL — ABNORMAL HIGH (ref 8–23)
CO2: 22 mmol/L (ref 22–32)
Calcium: 8.4 mg/dL — ABNORMAL LOW (ref 8.9–10.3)
Chloride: 106 mmol/L (ref 98–111)
Creatinine, Ser: 1.33 mg/dL — ABNORMAL HIGH (ref 0.61–1.24)
GFR, Estimated: 58 mL/min — ABNORMAL LOW (ref >60)
Glucose, Bld: 232 mg/dL — ABNORMAL HIGH (ref 70–99)
Potassium: 4.4 mmol/L (ref 3.5–5.1)
Sodium: 135 mmol/L (ref 135–145)

## 2023-07-26 LAB — PHOSPHORUS: Phosphorus: 2.7 mg/dL (ref 2.5–4.6)

## 2023-07-26 LAB — CBC WITH DIFFERENTIAL/PLATELET
Abs Immature Granulocytes: 0.01 10*3/uL (ref 0.00–0.07)
Basophils Absolute: 0 10*3/uL (ref 0.0–0.1)
Basophils Relative: 1 %
Eosinophils Absolute: 0.2 10*3/uL (ref 0.0–0.5)
Eosinophils Relative: 5 %
HCT: 31.7 % — ABNORMAL LOW (ref 39.0–52.0)
Hemoglobin: 10.8 g/dL — ABNORMAL LOW (ref 13.0–17.0)
Immature Granulocytes: 0 %
Lymphocytes Relative: 44 %
Lymphs Abs: 1.6 10*3/uL (ref 0.7–4.0)
MCH: 30.9 pg (ref 26.0–34.0)
MCHC: 34.1 g/dL (ref 30.0–36.0)
MCV: 90.6 fL (ref 80.0–100.0)
Monocytes Absolute: 0.6 10*3/uL (ref 0.1–1.0)
Monocytes Relative: 15 %
Neutro Abs: 1.3 10*3/uL — ABNORMAL LOW (ref 1.7–7.7)
Neutrophils Relative %: 35 %
Platelets: 197 10*3/uL (ref 150–400)
RBC: 3.5 MIL/uL — ABNORMAL LOW (ref 4.22–5.81)
RDW: 14.7 % (ref 11.5–15.5)
WBC: 3.7 10*3/uL — ABNORMAL LOW (ref 4.0–10.5)
nRBC: 0 % (ref 0.0–0.2)

## 2023-07-26 LAB — GLUCOSE, CAPILLARY
Glucose-Capillary: 154 mg/dL — ABNORMAL HIGH (ref 70–99)
Glucose-Capillary: 133 mg/dL — ABNORMAL HIGH (ref 70–99)
Glucose-Capillary: 41 mg/dL — CL (ref 70–99)
Glucose-Capillary: 52 mg/dL — ABNORMAL LOW (ref 70–99)
Glucose-Capillary: 167 mg/dL — ABNORMAL HIGH (ref 70–99)
Glucose-Capillary: 147 mg/dL — ABNORMAL HIGH (ref 70–99)

## 2023-07-26 LAB — MAGNESIUM: Magnesium: 1.9 mg/dL (ref 1.7–2.4)

## 2023-07-26 LAB — PATHOLOGIST SMEAR REVIEW

## 2023-07-26 MED ORDER — DEXTROSE 50 % IV SOLN
12.5000 g | INTRAVENOUS | Status: AC
  Administered 2023-07-26: 12.5 g via INTRAVENOUS
  Filled 2023-07-26: qty 50

## 2023-07-26 MED ORDER — FLUDROCORTISONE ACETATE 0.1 MG PO TABS
0.1000 mg | ORAL_TABLET | Freq: Every day | ORAL | Status: DC
  Filled 2023-07-26: qty 1

## 2023-07-26 NOTE — Plan of Care (Signed)

## 2023-07-26 NOTE — Plan of Care (Signed)
   Problem: Activity: Goal: Risk for activity intolerance will decrease Outcome: Progressing   Problem: Pain Managment: Goal: General experience of comfort will improve and/or be controlled Outcome: Progressing   Problem: Safety: Goal: Ability to remain free from injury will improve Outcome: Progressing

## 2023-07-26 NOTE — Care Management Important Message (Signed)
 Important Message  Patient Details  Name: ARIC ATER MRN: [id number] Date of Birth: [birth year]   Important Message Given:  Yes - Medicare IM     Arvil Utz W, CMA 07/26/2023, 11:32 AM

## 2023-07-26 NOTE — Progress Notes (Signed)
 Occupational Therapy Treatment Patient Details Name: Nicholas Weiss MRN: [id number] DOB: 1950-06-19 Today's Date: 07/26/2023   History of present illness Pt is a 73 year old male admitted due to multiple recent falls, presyncope and inability of caregiver to continue caring for him.     PMH significant for hypertension, prior CVA, multiple myeloma, CKD 3B, paroxysmal A-fib, type 1 diabetes mellitus, A-fib on Eliquis , myoclonus on Keppra , dysautonomia/orthostatic hypotension, with recurrent syncope, frequent falls   OT comments  Pt is supine in bed on arrival. Easily arousable and agreeable to OT session. He denies pain, but remains somewhat lethargic. BP monitored to check orthostatics  BP- Lying BP- Sitting BP-standing  07/26/23 1339 143/87 (!) 128/95 112/82    Pt required Mod A for LB dressing to don L sock, able to don R sock in long sitting position in bed. SUP for all bed mobility with increased time as pt edu to slow movements. STS from EOB with CGA and ambulated to toilet/bathroom requiring Min A for toilet transfer for safety with descent. Mod A for clothing management. Able to stand and perform oral care and hand hygiene at sink with seated rest break between tasks to prevent further BP drop. CGA provided at all times for safety.  Pt returned to bed with all needs in place and will cont to require skilled acute OT services to maximize his safety and IND to return to PLOF.       If plan is discharge home, recommend the following:  Assistance with cooking/housework;Supervision due to cognitive status;A little help with bathing/dressing/bathroom;A little help with walking and/or transfers   Equipment Recommendations  Wheelchair cushion (measurements OT)    Recommendations for Other Services      Precautions / Restrictions Precautions Precautions: Fall Recall of Precautions/Restrictions: Impaired Precaution/Restrictions Comments: orthostatic; reports >5 falls in last  month Restrictions Weight Bearing Restrictions Per Provider Order: No       Mobility Bed Mobility Overal bed mobility: Needs Assistance Bed Mobility: Supine to Sit, Sit to Supine     Supine to sit: Supervision, Used rails, HOB elevated Sit to supine: Supervision, Used rails        Transfers Overall transfer level: Needs assistance Equipment used: Straight cane, None Transfers: Sit to/from Stand Sit to Stand: Min assist, Contact guard assist           General transfer comment: continues with drop in BP with change in positions and time up, but able to participate in session with lowest reading of 112/82; CGA provided for safety throughout session and pt moves slow     Balance Overall balance assessment: Needs assistance Sitting-balance support: Feet supported Sitting balance-Leahy Scale: Fair     Standing balance support: During functional activity, Reliant on assistive device for balance, Single extremity supported Standing balance-Leahy Scale: Fair Standing balance comment: using SPC and reaching out for walls/door at times                           ADL either performed or assessed with clinical judgement   ADL Overall ADL's : Needs assistance/impaired     Grooming: Wash/dry hands;Oral care;Standing;Contact guard assist               Lower Body Dressing: Moderate assistance Lower Body Dressing Details (indicate cue type and reason): able to don R sock with increased time long sitting in bed, assist for L sock Toilet Transfer: Minimal assistance;Cueing for safety;Grab bars Toilet Transfer Details (  indicate cue type and reason): for safe descent onto toilet with SPC use Toileting- Clothing Manipulation and Hygiene: Maximal assistance;Contact guard assist Toileting - Clothing Manipulation Details (indicate cue type and reason): to pull mesh undies now; able to pull up on his own     Functional mobility during ADLs: Rolling walker (2  wheels);Contact guard assist      Extremity/Trunk Assessment              Vision       Perception     Praxis     Communication Communication Communication: No apparent difficulties   Cognition Arousal: Lethargic Behavior During Therapy: Flat affect Cognition: Cognition impaired                               Following commands: Impaired Following commands impaired: Follows one step commands with increased time      Cueing   Cueing Techniques: Verbal cues, Gestural cues, Tactile cues, Visual cues  Exercises      Shoulder Instructions       General Comments remains orthostatic-chronic issue    Pertinent Vitals/ Pain       Pain Assessment Pain Assessment: No/denies pain  Home Living                                          Prior Functioning/Environment              Frequency  Min 2X/week        Progress Toward Goals  OT Goals(current goals can now be found in the care plan section)  Progress towards OT goals: Progressing toward goals  Acute Rehab OT Goals Patient Stated Goal: improve strength/function OT Goal Formulation: With patient Time For Goal Achievement: 08/06/23 Potential to Achieve Goals: Good  Plan      Co-evaluation                 AM-PAC OT "6 Clicks" Daily Activity     Outcome Measure   Help from another person eating meals?: None Help from another person taking care of personal grooming?: None Help from another person toileting, which includes using toliet, bedpan, or urinal?: A Little Help from another person bathing (including washing, rinsing, drying)?: A Lot Help from another person to put on and taking off regular upper body clothing?: None Help from another person to put on and taking off regular lower body clothing?: A Little 6 Click Score: 20    End of Session Equipment Utilized During Treatment: Gait belt (SPC)  OT Visit Diagnosis: Unsteadiness on feet (R26.81);Other  abnormalities of gait and mobility (R26.89)   Activity Tolerance Patient tolerated treatment well;Treatment limited secondary to medical complications (Comment)   Patient Left in bed;with call bell/phone within reach;with bed alarm set   Nurse Communication Mobility status        Time: 4098-1191 OT Time Calculation (min): 19 min  Charges: OT General Charges $OT Visit: 1 Visit OT Treatments $Self Care/Home Management : 8-22 mins  Dabney Dever, OTR/L  07/26/23, 1:51 PM   Nicholas Weiss 07/26/2023, 1:46 PM

## 2023-07-26 NOTE — Inpatient Diabetes Management (Signed)
 Inpatient Diabetes Program Recommendations  AACE/ADA: New Consensus Statement on Inpatient Glycemic Control (2015)  Target Ranges:  Prepandial:   less than 140 mg/dL      Peak postprandial:   less than 180 mg/dL (1-2 hours)      Critically ill patients:  140 - 180 mg/dL   Lab Results  Component Value Date   GLUCAP 154 (H) 07/26/2023   HGBA1C 10.0 (H) 07/21/2023    Review of Glycemic Control  Latest Reference Range & Units 07/25/23 07:27 07/25/23 11:33 07/25/23 15:54 07/25/23 21:45 07/26/23 08:24  Glucose-Capillary 70 - 99 mg/dL 086 (H) 578 (H) 469 (H) 346 (H) 154 (H)  (H): Data is abnormally high Diabetes history: DM 1 Outpatient Diabetes medications:  Insulin  pump at home (T-slim) Basal rate 12am 0.25 units/hr (24hr = 8 units) Bolus settings ISF 60 I:C ratio 20 Target 120  Off pump regimen: Novolog  1-9 units tid with meals  Tresiba  5 units daily Current orders for Inpatient glycemic control:  Novolog  0-9 units tid with meals and HS Semglee  5 units daily  Inpatient Diabetes Program Recommendations:    Consider adding Novolog  2 units TID (assuming patient is consuming >50% of meals).  Thanks, Marjo Sievert, MSN, RNC-OB Diabetes Coordinator [phone] (8a-5p)

## 2023-07-26 NOTE — Progress Notes (Signed)
 Progress Note   Patient: Nicholas Weiss:[id number] DOB: 1950/11/11 DOA: 07/21/2023     3 DOS: the patient was seen and examined on 07/26/2023   Brief hospital course: AKAI WEATHERBY is a 73 y.o. male with medical history significant for hypertension, prior CVA, multiple myeloma, CKD 3B, paroxysmal A-fib, type 1 diabetes mellitus, A-fib on Eliquis , myoclonus on Keppra , dysautonomia/orthostatic hypotension, with recurrent syncope, frequent falls being admitted due to multiple recent falls, presyncope and inability of caregiver to continue caring for him.   Assessment and Plan:  # Frequent falls # Severe Orthostatic hypotension/ dysautonomia # History of injury (brain bleed, rib fractures) secondary to recurrent syncope with collapse Patient had full syncope workup during prior hospitalizations with the following results: - CTA head and neck 01/2023 with 50% stenosis in the proximal bilateral ICAs, just distal to the bifurcations.Severe stenosis at the origin of the right vertebral artery -Echo 02/2023 with EF 65 to 70%, G1 DD, no significant valvulopathy BP very labile, this AM elevated SBP > 180, got hydralazine  dropped to 60s. Fluid bolus ordered. Continue TED hose. Continue continue Florinef . He is not on midodrine  at home. Fall precautions and up with assistance Cardiac monitoring PT/ OT evaluation once BP improved. TOC consult for placement.   # Type 1 diabetes Sugars stable. Semglee  dose increased to 5 units BID with sliding scale coverage. RN notified that his insulin  pump is removed. Diabetes educator on board.   # CKD 3B Creatinine at baseline Monitor renal function and avoid nephrotoxins   # Atrial fibrillation # Chronic anticoagulation Continue Eliquis  Not on beta-blocker due to chronic hypotension   # Prolonged QT Avoid QT prolonging drugs.  # Myoclonus Continue Keppra    # Anemia of chronic kidney failure, stage 3 (moderate)  Hemoglobin at baseline. No  active bleeding.    Leukopenia, and neutropenia, afebrile, monitor off antibiotics.   It could be secondary to h/o multiple myeloma Continue to monitor CBC   # Essential (primary) hypertension BP stable.  Hold hydralazine  for now.   # Multiple myeloma  No acute issues suspected    Out of bed to chair. Incentive spirometry. Nursing supportive care. Fall, aspiration precautions. Diet:  Diet Orders (From admission, onward)     Start     Ordered   07/21/23 2101  Diet heart healthy/carb modified Room service appropriate? Yes; Fluid consistency: Thin  Diet effective now       Question Answer Comment  Diet-HS Snack? Nothing   Room service appropriate? Yes   Fluid consistency: Thin      07/21/23 2101           DVT prophylaxis: Place TED hose Start: 07/21/23 2118 apixaban  (ELIQUIS ) tablet 5 mg  Level of care: Telemetry Medical   Code Status: Full Code  Subjective: Patient is seen and examined today morning.  Patient was lying in the bed comfortably, he was sleeping, woke up by calling his name.  Patient says that he is feeling fine, no active issues except as walking.  I explained to patient that he is having orthostatic hypotension and frequent falls that is why he is here in the hospital and awaiting for SNF placement.      Physical Exam: Vitals:   07/25/23 1421 07/25/23 2145 07/26/23 0301 07/26/23 0824  BP: (!) 177/95 (!) 147/74 (!) 150/82 (!) 167/89  Pulse: 65 72 71 65  Resp: 16 16 16 17   Temp: 97.7 F (36.5 C) 98.2 F (36.8 C) 97.9 F (36.6 C) 98.4  F (36.9 C)  TempSrc:  Oral Oral   SpO2: 100% 100% 100% 100%  Weight:      Height:        General - Elderly African-American male, no apparent distress HEENT - PERRLA, EOMI, atraumatic head, non tender sinuses. Lung - Clear, no rales, rhonchi, wheezes. Heart - S1, S2 heard, no murmurs, rubs, trace pedal edema. Abdomen - Soft, non tender, bowel sounds good Neuro - Sleeping, arousable, able to answer me, non  focal exam. Skin - Warm and dry.  Data Reviewed:      Latest Ref Rng & Units 07/26/2023    5:44 AM 07/25/2023    4:43 AM 07/24/2023    5:09 AM  CBC  WBC 4.0 - 10.5 K/uL 3.7  2.5  2.7   Hemoglobin 13.0 - 17.0 g/dL 78.2  95.6  9.4   Hematocrit 39.0 - 52.0 % 31.7  30.8  27.8   Platelets 150 - 400 K/uL 197  184  179       Latest Ref Rng & Units 07/26/2023    5:44 AM 07/25/2023    4:43 AM 07/24/2023    5:09 AM  BMP  Glucose 70 - 99 mg/dL 213  79  086   BUN 8 - 23 mg/dL 28  28  33   Creatinine 0.61 - 1.24 mg/dL 5.78  4.69  6.29   Sodium 135 - 145 mmol/L 135  134  136   Potassium 3.5 - 5.1 mmol/L 4.4  4.1  4.3   Chloride 98 - 111 mmol/L 106  105  104   CO2 22 - 32 mmol/L 22  23  25    Calcium  8.9 - 10.3 mg/dL 8.4  8.3  8.0    No results found.   Family Communication: Discussed with patient, he understand and agree. All questions answered.  Disposition: Status is: Inpatient The patient will require care spanning > 2 midnights and should be moved to inpatient because: hypotension, IV fluid resuscitation, PT eval. Planned Discharge Destination: Rehab   Time spent: 40 minutes  Author: Althia Atlas, MD 07/26/2023 3:21 PM Secure chat 7am to 7pm For on call review [email or web address].

## 2023-07-26 NOTE — Progress Notes (Addendum)
 Hypoglycemic Event  CBG: 41  Treatment: 8 oz juice/soda  Symptoms: None  Follow-up CBG: Time:1619 CBG Result:52  Possible Reasons for Event: Inadequate meal intake  Comments/MD notified:Dr. Hubert Madden notified    25ml of 50% dextrose  given.   Pauline Pegues N Leon Goodnow

## 2023-07-27 DIAGNOSIS — R42 Dizziness and giddiness: Secondary | ICD-10-CM | POA: Diagnosis not present

## 2023-07-27 DIAGNOSIS — R55 Syncope and collapse: Secondary | ICD-10-CM | POA: Diagnosis not present

## 2023-07-27 LAB — CBC WITH DIFFERENTIAL/PLATELET
Abs Immature Granulocytes: 0 10*3/uL (ref 0.00–0.07)
Basophils Absolute: 0 10*3/uL (ref 0.0–0.1)
Basophils Relative: 1 %
Eosinophils Absolute: 0.2 10*3/uL (ref 0.0–0.5)
Eosinophils Relative: 4 %
HCT: 28.9 % — ABNORMAL LOW (ref 39.0–52.0)
Hemoglobin: 9.9 g/dL — ABNORMAL LOW (ref 13.0–17.0)
Immature Granulocytes: 0 %
Lymphocytes Relative: 39 %
Lymphs Abs: 1.5 10*3/uL (ref 0.7–4.0)
MCH: 31.5 pg (ref 26.0–34.0)
MCHC: 34.3 g/dL (ref 30.0–36.0)
MCV: 92 fL (ref 80.0–100.0)
Monocytes Absolute: 0.6 10*3/uL (ref 0.1–1.0)
Monocytes Relative: 15 %
Neutro Abs: 1.7 10*3/uL (ref 1.7–7.7)
Neutrophils Relative %: 41 %
Platelets: 175 10*3/uL (ref 150–400)
RBC: 3.14 MIL/uL — ABNORMAL LOW (ref 4.22–5.81)
RDW: 15.1 % (ref 11.5–15.5)
WBC: 3.9 10*3/uL — ABNORMAL LOW (ref 4.0–10.5)
nRBC: 0 % (ref 0.0–0.2)

## 2023-07-27 LAB — GLUCOSE, CAPILLARY
Glucose-Capillary: 49 mg/dL — ABNORMAL LOW (ref 70–99)
Glucose-Capillary: 71 mg/dL (ref 70–99)
Glucose-Capillary: 95 mg/dL (ref 70–99)
Glucose-Capillary: 61 mg/dL — ABNORMAL LOW (ref 70–99)
Glucose-Capillary: 61 mg/dL — ABNORMAL LOW (ref 70–99)
Glucose-Capillary: 111 mg/dL — ABNORMAL HIGH (ref 70–99)
Glucose-Capillary: 136 mg/dL — ABNORMAL HIGH (ref 70–99)
Glucose-Capillary: 197 mg/dL — ABNORMAL HIGH (ref 70–99)

## 2023-07-27 LAB — BASIC METABOLIC PANEL WITH GFR
Anion gap: 7 (ref 5–15)
BUN: 28 mg/dL — ABNORMAL HIGH (ref 8–23)
CO2: 24 mmol/L (ref 22–32)
Calcium: 8.6 mg/dL — ABNORMAL LOW (ref 8.9–10.3)
Chloride: 104 mmol/L (ref 98–111)
Creatinine, Ser: 1.17 mg/dL (ref 0.61–1.24)
GFR, Estimated: >60 mL/min (ref >60)
Glucose, Bld: 55 mg/dL — ABNORMAL LOW (ref 70–99)
Potassium: 3.5 mmol/L (ref 3.5–5.1)
Sodium: 135 mmol/L (ref 135–145)

## 2023-07-27 LAB — MAGNESIUM: Magnesium: 1.9 mg/dL (ref 1.7–2.4)

## 2023-07-27 LAB — PHOSPHORUS: Phosphorus: 3.3 mg/dL (ref 2.5–4.6)

## 2023-07-27 MED ORDER — FLUDROCORTISONE ACETATE 0.1 MG PO TABS
0.1000 mg | ORAL_TABLET | Freq: Every day | ORAL | Status: DC
  Administered 2023-07-28 – 2023-07-29 (×2): 0.1 mg via ORAL
  Filled 2023-07-27 (×2): qty 1

## 2023-07-27 MED ORDER — INSULIN ASPART 100 UNIT/ML IJ SOLN
0.0000 [IU] | Freq: Three times a day (TID) | INTRAMUSCULAR | Status: DC
  Administered 2023-07-28: 2 [IU] via SUBCUTANEOUS
  Administered 2023-07-29: 1 [IU] via SUBCUTANEOUS
  Administered 2023-07-29: 2 [IU] via SUBCUTANEOUS
  Filled 2023-07-27 (×3): qty 1

## 2023-07-27 MED ORDER — INSULIN ASPART 100 UNIT/ML IJ SOLN: 0.0000 [IU] | Freq: Every day | INTRAMUSCULAR | Status: DC

## 2023-07-27 MED ORDER — INSULIN GLARGINE-YFGN 100 UNIT/ML ~~LOC~~ SOLN
4.0000 [IU] | Freq: Two times a day (BID) | SUBCUTANEOUS | Status: DC
Start: 2023-07-27 — End: 2023-07-28
  Administered 2023-07-27 (×2): 4 [IU] via SUBCUTANEOUS
  Filled 2023-07-27 (×3): qty 0.04

## 2023-07-27 NOTE — TOC Progression Note (Signed)
 Transition of Care [facility]) - Progression Note    Patient Details  Name: Nicholas Weiss MRN: [id number] Date of Birth: [birth year]  Transition of Care [facility]) CM/SW Contact  Crayton Docker, RN Phone Number: 07/27/2023, 3:26 PM  Clinical Narrative:     The above named patient is recommended to go to Short Term Rehab for strengthening and gait training for balance.  It is expected that the Short Term Rehab stay will be less than 30 days.  The patient is expected to return home after Rehab.   Expected Discharge Plan: Home/Self Care Barriers to Discharge: Continued Medical Work up  Expected Discharge Plan and Services   Discharge Planning Services: CM Consult   Living arrangements for the past 2 months: Single Family Home          Social Determinants of Health (SDOH) Interventions SDOH Screenings   Food Insecurity: No Food Insecurity (07/22/2023)  Housing: Low Risk  (07/22/2023)  Transportation Needs: No Transportation Needs (07/22/2023)  Utilities: Not At Risk (07/22/2023)  Alcohol Screen: Low Risk  (10/02/2020)  Depression (PHQ2-9): Medium Risk (10/02/2020)  Financial Resource Strain: Low Risk  (05/19/2023)   Received from [facility]  Physical Activity: Insufficiently Active (01/01/2020)  Social Connections: Patient Declined (07/22/2023)  Stress: No Stress Concern Present (01/01/2020)  Tobacco Use: Low Risk  (07/22/2023)  Recent Concern: Tobacco Use - Medium Risk (07/09/2023)   Received from [facility]    Readmission Risk Interventions    02/16/2023    3:56 PM 08/24/2022   10:57 AM 05/20/2022   11:41 AM  Readmission Risk Prevention Plan  Transportation Screening Complete Complete Complete  Medication Review Oceanographer) Complete Complete Complete  PCP or Specialist appointment within 3-5 days of discharge Complete Complete Complete  HRI or Home Care Consult Complete Complete Complete  SW Recovery Care/Counseling Consult Not Complete     Palliative Care Screening Not Applicable  Complete  Skilled Nursing Facility Complete Complete Complete

## 2023-07-27 NOTE — Progress Notes (Signed)
 Physical Therapy Treatment Patient Details Name: Nicholas Weiss MRN: [id number] DOB: Sep 18, 1950 Today's Date: 07/27/2023   History of Present Illness Pt is a 73 year old male admitted due to multiple recent falls, presyncope and inability of caregiver to continue caring for him.     PMH significant for hypertension, prior CVA, multiple myeloma, CKD 3B, paroxysmal A-fib, type 1 diabetes mellitus, A-fib on Eliquis , myoclonus on Keppra , dysautonomia/orthostatic hypotension, with recurrent syncope, frequent falls    PT Comments  Pt is making good progress towards goals. SPC used with safe technique and pt reports no dizziness noted with ambulation. Pt is poor historian and is frustrated that he is still in hospital. Reports he has assistance at home, re-iterated the need for compliance with TED hose and binder to assist with blood pressure. Discussed recs with TOC/care team. Will continue to progress as able. Pt is currently at baseline level. Orthostatic VS for the past 24 hrs:  BP- Lying BP- Sitting BP- Standing at 0 minutes  07/27/23 1652 (!) 164/102 122/87 101/62        If plan is discharge home, recommend the following: A little help with walking and/or transfers;A little help with bathing/dressing/bathroom;Assistance with cooking/housework;Assist for transportation;Help with stairs or ramp for entrance;Direct supervision/assist for medications management   Can travel by private vehicle        Equipment Recommendations  None recommended by PT    Recommendations for Other Services       Precautions / Restrictions Precautions Precautions: Fall Recall of Precautions/Restrictions: Impaired Precaution/Restrictions Comments: orthostatic; reports >5 falls in last month Restrictions Weight Bearing Restrictions Per Provider Order: No     Mobility  Bed Mobility Overal bed mobility: Needs Assistance Bed Mobility: Supine to Sit, Sit to Supine     Supine to sit: Supervision, Used  rails, HOB elevated Sit to supine: Supervision, Used rails   General bed mobility comments: ease of transition    Transfers Overall transfer level: Needs assistance Equipment used: Straight cane, None Transfers: Sit to/from Stand Sit to Stand: Supervision           General transfer comment: follows commands. Cued to stand in standing to ensure no dizziness noted    Ambulation/Gait Ambulation/Gait assistance: Contact guard assist Gait Distance (Feet): 200 Feet Assistive device: Straight cane Gait Pattern/deviations: Step-through pattern       General Gait Details: ambulated around RN station. 1-2 bouts of veering to R which patient reports baseline. SPC used and pt reports no dizziness. BP taken after ambulation (100/65)   Stairs             Wheelchair Mobility     Tilt Bed    Modified Rankin (Stroke Patients Only)       Balance Overall balance assessment: Needs assistance Sitting-balance support: Feet supported Sitting balance-Leahy Scale: Fair     Standing balance support: During functional activity, Reliant on assistive device for balance, Single extremity supported Standing balance-Leahy Scale: Fair                              Hotel manager: No apparent difficulties  Cognition Arousal: Alert Behavior During Therapy: Flat affect   PT - Cognitive impairments: History of cognitive impairments                       PT - Cognition Comments: confused on why he is admitted to the hospital. very ornery and wants  to go home Following commands: Impaired Following commands impaired: Follows one step commands with increased time    Cueing Cueing Techniques: Verbal cues, Gestural cues, Tactile cues, Visual cues  Exercises Other Exercises Other Exercises: heavy cues for education regarding TED hose and abdominal binder. Poor compliance Other Exercises: educated patient in role of PT and  recommendations;    General Comments        Pertinent Vitals/Pain Pain Assessment Pain Assessment: No/denies pain    Home Living                          Prior Function            PT Goals (current goals can now be found in the care plan section) Acute Rehab PT Goals Patient Stated Goal: to go home. PT Goal Formulation: With patient Time For Goal Achievement: 08/07/23 Potential to Achieve Goals: Fair Progress towards PT goals: Progressing toward goals    Frequency    Min 1X/week      PT Plan      Co-evaluation              AM-PAC PT "6 Clicks" Mobility   Outcome Measure  Help needed turning from your back to your side while in a flat bed without using bedrails?: None Help needed moving from lying on your back to sitting on the side of a flat bed without using bedrails?: None Help needed moving to and from a bed to a chair (including a wheelchair)?: A Little Help needed standing up from a chair using your arms (e.g., wheelchair or bedside chair)?: A Little Help needed to walk in hospital room?: A Little Help needed climbing 3-5 steps with a railing? : A Lot 6 Click Score: 19    End of Session Equipment Utilized During Treatment: Gait belt Activity Tolerance: Patient tolerated treatment well Patient left: in bed;with bed alarm set Nurse Communication: Mobility status PT Visit Diagnosis: Unsteadiness on feet (R26.81)     Time: 4540-9811 PT Time Calculation (min) (ACUTE ONLY): 26 min  Charges:    $Gait Training: 23-37 mins PT General Charges $$ ACUTE PT VISIT: 1 Visit                     Amparo Balk, PT, DPT, GCS [phone]    [name] 07/27/2023, 4:59 PM

## 2023-07-27 NOTE — Plan of Care (Signed)
  Problem: Education: Goal: Ability to describe self-care measures that may prevent or decrease complications (Diabetes Survival Skills Education) will improve Outcome: Progressing Goal: Individualized Educational Video(s) Outcome: Progressing   Problem: Coping: Goal: Ability to adjust to condition or change in health will improve Outcome: Progressing   Problem: Fluid Volume: Goal: Ability to maintain a balanced intake and output will improve Outcome: Progressing   Problem: Health Behavior/Discharge Planning: Goal: Ability to identify and utilize available resources and services will improve Outcome: Progressing Goal: Ability to manage health-related needs will improve Outcome: Progressing   Problem: Metabolic: Goal: Ability to maintain appropriate glucose levels will improve Outcome: Progressing   Problem: Nutritional: Goal: Maintenance of adequate nutrition will improve Outcome: Progressing Goal: Progress toward achieving an optimal weight will improve Outcome: Progressing   Problem: Skin Integrity: Goal: Risk for impaired skin integrity will decrease Outcome: Progressing   Problem: Education: Goal: Knowledge of condition and prescribed therapy will improve Outcome: Progressing   Problem: Cardiac: Goal: Will achieve and/or maintain adequate cardiac output Outcome: Progressing   Problem: Physical Regulation: Goal: Complications related to the disease process, condition or treatment will be avoided or minimized Outcome: Progressing   Problem: Education: Goal: Knowledge of General Education information will improve Description: Including pain rating scale, medication(s)/side effects and non-pharmacologic comfort measures Outcome: Progressing   Problem: Health Behavior/Discharge Planning: Goal: Ability to manage health-related needs will improve Outcome: Progressing   Problem: Clinical Measurements: Goal: Ability to maintain clinical measurements within normal  limits will improve Outcome: Progressing Goal: Will remain free from infection Outcome: Progressing Goal: Diagnostic test results will improve Outcome: Progressing Goal: Respiratory complications will improve Outcome: Progressing Goal: Cardiovascular complication will be avoided Outcome: Progressing   Problem: Activity: Goal: Risk for activity intolerance will decrease Outcome: Progressing   Problem: Nutrition: Goal: Adequate nutrition will be maintained Outcome: Progressing   Problem: Coping: Goal: Level of anxiety will decrease Outcome: Progressing   Problem: Elimination: Goal: Will not experience complications related to bowel motility Outcome: Progressing Goal: Will not experience complications related to urinary retention Outcome: Progressing   Problem: Pain Managment: Goal: General experience of comfort will improve and/or be controlled Outcome: Progressing   Problem: Safety: Goal: Ability to remain free from injury will improve Outcome: Progressing   Problem: Skin Integrity: Goal: Risk for impaired skin integrity will decrease Outcome: Progressing

## 2023-07-27 NOTE — Inpatient Diabetes Management (Signed)
 Inpatient Diabetes Program Recommendations  AACE/ADA: New Consensus Statement on Inpatient Glycemic Control (2015)  Target Ranges:  Prepandial:   less than 140 mg/dL      Peak postprandial:   less than 180 mg/dL (1-2 hours)      Critically ill patients:  140 - 180 mg/dL   Lab Results  Component Value Date   GLUCAP 95 07/27/2023   HGBA1C 10.0 (H) 07/21/2023    Review of Glycemic Control  Latest Reference Range & Units 07/26/23 16:19 07/26/23 17:35 07/26/23 20:55 07/27/23 05:44 07/27/23 06:12 07/27/23 08:09  Glucose-Capillary 70 - 99 mg/dL 52 (L) 562 (H) 130 (H) 49 (L) 71 95  (L): Data is abnormally low (H): Data is abnormally high Diabetes history: DM 1 Outpatient Diabetes medications:  Insulin  pump at home (T-slim) Basal rate 12am 0.25 units/hr (24hr = 8 units) Bolus settings ISF 60 I:C ratio 20 Target 120  Off pump regimen: Novolog  1-9 units tid with meals  Tresiba  5 units daily Current orders for Inpatient glycemic control:  Novolog  0-9 units tid with meals and HS Semglee  5 units BID Florinef  0.1 mg every day  Inpatient Diabetes Program Recommendations:     Consider changing correction to Novolog  0-6 units TID and decreasing Semglee  to 4 units BID  Thanks, Marjo Sievert, MSN, RNC-OB Diabetes Coordinator [phone] (8a-5p)

## 2023-07-27 NOTE — Progress Notes (Addendum)
 Progress Note   Patient: Nicholas Weiss AVW:[id number] DOB: 21-Jul-1950 DOA: 07/21/2023     4 DOS: the patient was seen and examined on 07/27/2023   Brief hospital course: BRITT BORST is a 73 y.o. male with medical history significant for hypertension, prior CVA, multiple myeloma, CKD 3B, paroxysmal A-fib, type 1 diabetes mellitus, A-fib on Eliquis , myoclonus on Keppra , dysautonomia/orthostatic hypotension, with recurrent syncope, frequent falls being admitted due to multiple recent falls, presyncope and inability of caregiver to continue caring for him.   Assessment and Plan:  # Frequent falls # Severe Orthostatic hypotension/ dysautonomia # History of injury (brain bleed, rib fractures) secondary to recurrent syncope with collapse Patient had full syncope workup during prior hospitalizations with the following results: - CTA head and neck 01/2023 with 50% stenosis in the proximal bilateral ICAs, just distal to the bifurcations.Severe stenosis at the origin of the right vertebral artery -Echo 02/2023 with EF 65 to 70%, G1 DD, no significant valvulopathy BP very labile, this AM elevated SBP > 180, got hydralazine  dropped to 60s. Fluid bolus ordered. Continue TED hose. Continue Florinef  with holding parameters. He is not on midodrine  at home. Fall precautions and up with assistance Cardiac monitoring PT/ OT evaluation once BP improved. TOC consult for placement. 4/29 advised to PT to use abdominal binder and compression stockings during ambulation to support BP  4/29 orthostatics with TED hose and binder on: sup: 164/102 sit: 122/87 stand: 101/62 after walking entire lap: 100/65     # Type 1 diabetes Sugars stable. S/p Semglee  5 units BID with sliding scale coverage. RN notified that his insulin  pump is removed. Diabetes educator on board. 4/28 hypoglycemia episode, decreased Semglee  4 units twice daily and sensitive sliding scale    # CKD 3B Creatinine at baseline Monitor  renal function and avoid nephrotoxins   # Atrial fibrillation # Chronic anticoagulation Continue Eliquis  Not on beta-blocker due to chronic hypotension   # Prolonged QT Avoid QT prolonging drugs.  # Myoclonus Continue Keppra    # Anemia of chronic kidney failure, stage 3 (moderate)  Hemoglobin at baseline. No active bleeding.    Leukopenia, and neutropenia, afebrile, monitor off antibiotics.   It could be secondary to h/o multiple myeloma Continue to monitor CBC   # Essential (primary) hypertension BP stable.  Hold hydralazine  for now.   # Multiple myeloma  No acute issues suspected    Out of bed to chair. Incentive spirometry. Nursing supportive care. Fall, aspiration precautions. Diet:  Diet Orders (From admission, onward)     Start     Ordered   07/21/23 2101  Diet heart healthy/carb modified Room service appropriate? Yes; Fluid consistency: Thin  Diet effective now       Question Answer Comment  Diet-HS Snack? Nothing   Room service appropriate? Yes   Fluid consistency: Thin      07/21/23 2101           DVT prophylaxis: Place TED hose Start: 07/21/23 2118 apixaban  (ELIQUIS ) tablet 5 mg  Level of care: Telemetry Medical   Code Status: Full Code  Subjective: Patient was seen and examined today morning. Patient was lying in the bed comfortably, denied any symptoms of hypoglycemia.  Denied any complaints.   Physical Exam: Vitals:   07/26/23 0824 07/26/23 2051 07/27/23 0500 07/27/23 0545  BP: (!) 167/89 (!) 164/95  (!) 153/87  Pulse: 65 68  79  Resp: 17 18    Temp: 98.4 F (36.9 C) 97.9 F (36.6 C)  (!)  97.3 F (36.3 C)  TempSrc:  Oral    SpO2: 100% 100%  99%  Weight:   64.3 kg   Height:        General - Elderly African-American male, no apparent distress HEENT - PERRLA, EOMI, atraumatic head, non tender sinuses. Lung - Clear, no rales, rhonchi, wheezes. Heart - S1, S2 heard, no murmurs, rubs, trace pedal edema. Abdomen - Soft, non tender,  bowel sounds good Neuro - Sleeping, arousable, able to answer me, non focal exam. Skin - Warm and dry.  Data Reviewed:      Latest Ref Rng & Units 07/27/2023    5:17 AM 07/26/2023    5:44 AM 07/25/2023    4:43 AM  CBC  WBC 4.0 - 10.5 K/uL 3.9  3.7  2.5   Hemoglobin 13.0 - 17.0 g/dL 9.9  40.9  81.1   Hematocrit 39.0 - 52.0 % 28.9  31.7  30.8   Platelets 150 - 400 K/uL 175  197  184       Latest Ref Rng & Units 07/27/2023    5:17 AM 07/26/2023    5:44 AM 07/25/2023    4:43 AM  BMP  Glucose 70 - 99 mg/dL 55  914  79   BUN 8 - 23 mg/dL 28  28  28    Creatinine 0.61 - 1.24 mg/dL 7.82  9.56  2.13   Sodium 135 - 145 mmol/L 135  135  134   Potassium 3.5 - 5.1 mmol/L 3.5  4.4  4.1   Chloride 98 - 111 mmol/L 104  106  105   CO2 22 - 32 mmol/L 24  22  23    Calcium  8.9 - 10.3 mg/dL 8.6  8.4  8.3    No results found.   Family Communication: Discussed with patient, he understand and agree. All questions answered.  Disposition: Status is: Inpatient The patient will require care spanning > 2 midnights and should be moved to inpatient because: hypotension, IV fluid resuscitation, PT eval. Planned Discharge Destination: Rehab   Time spent: 40 minutes  Author: Althia Atlas, MD 07/27/2023 2:57 PM Secure chat 7am to 7pm For on call review [email or web address].

## 2023-07-27 NOTE — NC FL2 (Signed)
 [place]  MEDICAID FL2 LEVEL OF CARE FORM     IDENTIFICATION  Patient Name: Nicholas Weiss Birthdate: [birth year] Sex: male Admission Date (Current Location): 07/21/2023  [place] and [place] Number:  Chiropodist and Address:  [facility], [address], [place], [place] [address]      Provider Number: [id number]  Attending Physician Name and Address:  Althia Atlas, MD  Relative Name and Phone Number:  Cyndy Dresser, girlfriend, phone: [phone]    Current Level of Care: Hospital Recommended Level of Care: Skilled Nursing Facility Prior Approval Number:    Date Approved/Denied: 05/21/22 PASRR Number: [id number]  Discharge Plan: SNF    Current Diagnoses: Patient Active Problem List   Diagnosis Date Noted   Prolonged QT interval 07/22/2023   Uncontrolled type 1 diabetes mellitus with hypoglycemia, with long-term current use of insulin  (HCC) 04/05/2023   Hyperosmolar hyperglycemic state (HHS) (HCC) 03/26/2023   Iron  deficiency anemia 03/26/2023   Seizure disorder (HCC) 03/26/2023   Systemic inflammatory response syndrome (SIRS) associated with organ dysfunction (HCC) 03/26/2023   NSTEMI (non-ST elevated myocardial infarction) (HCC) 03/09/2023   Elevated troponin 03/08/2023   Paroxysmal atrial fibrillation (HCC) 03/08/2023   Chronic anticoagulation 03/08/2023   ICH (intracerebral hemorrhage) (HCC) 02/16/2023   Uncontrolled diabetes mellitus with hyperglycemia (HCC) 02/16/2023   GERD without esophagitis 02/16/2023   Hypertension 02/16/2023   Diabetic neuropathy (HCC) 02/16/2023   Intraventricular hemorrhage (HCC) 02/16/2023   Myoclonus 01/15/2023   Hypothermia 12/29/2022   Dizziness 11/13/2022   Substance abuse (HCC) 09/18/2022   History of GI bleed 09/18/2022   History of intraventricular hemorrhage following syncopal event 02/15/2023 09/18/2022   Uncontrolled type 1 diabetes mellitus with hyperglycemia, with long-term current  use of insulin  (HCC) 08/26/2022   Postural dizziness with presyncope 08/18/2022   Near syncope 08/18/2022   Closed fracture of shaft of left humerus 06/18/2022   Malnutrition of moderate degree 06/10/2022   Hypotension 06/10/2022   GI bleed 06/06/2022   Subdural hematoma (HCC) 05/15/2022   Falls 05/15/2022   Shoulder pain, left 05/15/2022   History of amputation of left great toe (HCC) 05/04/2022   Hypophosphatemia 04/28/2022   Protein-calorie malnutrition, severe 04/24/2022   Diabetic ulcer of toe associated with diabetes mellitus due to underlying condition, with fat layer exposed (HCC) 04/24/2022   Streptococcal bacteremia 04/24/2022   Leukopenia 04/24/2022   History of multiple myeloma 04/24/2022   Dysautonomia orthostatic hypotension with recurrent syncope 04/24/2022   Weight loss 04/23/2022   Osteomyelitis of left foot (HCC) 04/22/2022   DKA, type 1 (HCC) 04/22/2022   Dehydration 04/08/2022   Diabetic ketoacidosis without coma associated with type 1 diabetes mellitus (HCC) 04/07/2022   Hyperkalemia 04/07/2022   Traumatic pneumothorax 02/18/2022   Multiple fractures of ribs, left side, initial encounter for closed fracture 02/18/2022   Pneumothorax, closed, traumatic 02/18/2022   Benign prostatic hyperplasia with nocturia 02/12/2022   Stroke (cerebrum) (HCC) 11/07/2021   Multiple myeloma (HCC) 09/29/2021   Multiple myeloma not having achieved remission (HCC) 09/29/2021   Hyponatremia 08/18/2021   COVID-19 virus infection 08/13/2021   History of CVA (cerebrovascular accident) 08/13/2021   Syncope and collapse, recurrent 08/09/2021   Hepatic lesion 08/05/2021   Dyslipidemia 05/08/2021   History of substance abuse (HCC) 12/09/2020   Acute metabolic encephalopathy 12/02/2020   Cocaine abuse (HCC) 11/30/2020   Muscle twitching 09/12/2020   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   Pre-syncope 08/01/2020   Heart block AV first degree 08/01/2020   Anemia of chronic  kidney  failure, stage 3 (moderate) (HCC) 05/28/2020   PUD (peptic ulcer disease)    Esophageal dysphagia    Encounter for screening colonoscopy    Hyperglycemia 07/28/2019   Generalized weakness 07/28/2019   Hypoglycemia 04/27/2019   Unresponsiveness 04/27/2019   Lung nodule 04/07/2019   Acute renal failure superimposed on stage 3a chronic kidney disease (HCC)    Hypertensive urgency 04/05/2019   AKI (acute kidney injury) (HCC) 04/05/2019   CKD (chronic kidney disease) stage 3, GFR 30-59 ml/min (HCC) 04/05/2019   Hyperlipidemia 01/24/2016   Type 1 diabetes mellitus with diabetic neuropathy, unspecified (HCC) 05/31/2006    Orientation RESPIRATION BLADDER Height & Weight     Self, Time, Situation, Place  Normal Continent Weight: 64.3 kg Height:  5' 10.5" (179.1 cm)  BEHAVIORAL SYMPTOMS/MOOD NEUROLOGICAL BOWEL NUTRITION STATUS      Continent Diet  AMBULATORY STATUS COMMUNICATION OF NEEDS Skin   Limited Assist                           Personal Care Assistance Level of Assistance  Bathing, Feeding, Dressing, Total care Bathing Assistance: Independent Feeding assistance: Independent Dressing Assistance: Limited assistance Total Care Assistance: Limited assistance   Functional Limitations Info   (wears glasses)          SPECIAL CARE FACTORS FREQUENCY  PT (By licensed PT), OT (By licensed OT)     PT Frequency: 5x per week OT Frequency: 5x per week            Contractures      Additional Factors Info  Code Status, Allergies Code Status Info: Ful Code Allergies Info: Penicillins  No severity specified - Other (See Comments) Comments           Current Medications (07/27/2023):  This is the current hospital active medication list Current Facility-Administered Medications  Medication Dose Route Frequency Provider Last Rate Last Admin   acetaminophen  (TYLENOL ) tablet 650 mg  650 mg Oral Q6H PRN Duncan, Hazel V, MD   650 mg at 07/26/23 2008   Or   acetaminophen   (TYLENOL ) suppository 650 mg  650 mg Rectal Q6H PRN Lanetta Pion, MD       apixaban  (ELIQUIS ) tablet 5 mg  5 mg Oral BID Duncan, Hazel V, MD   5 mg at 07/27/23 1020   atorvastatin  (LIPITOR ) tablet 80 mg  80 mg Oral QHS Duncan, Hazel V, MD   80 mg at 07/26/23 2104   [START ON 07/28/2023] fludrocortisone  (FLORINEF ) tablet 0.1 mg  0.1 mg Oral Daily Althia Atlas, MD       insulin  aspart (novoLOG ) injection 0-5 Units  0-5 Units Subcutaneous QHS Althia Atlas, MD       insulin  aspart (novoLOG ) injection 0-6 Units  0-6 Units Subcutaneous TID WC Althia Atlas, MD       insulin  glargine-yfgn (SEMGLEE ) injection 4 Units  4 Units Subcutaneous BID Althia Atlas, MD   4 Units at 07/27/23 1057   levETIRAcetam  (KEPPRA ) tablet 500 mg  500 mg Oral BID Duncan, Hazel V, MD   500 mg at 07/27/23 1020   ondansetron  (ZOFRAN ) tablet 4 mg  4 mg Oral Q6H PRN Duncan, Hazel V, MD       Or   ondansetron  (ZOFRAN ) injection 4 mg  4 mg Intravenous Q6H PRN Duncan, Hazel V, MD       Oral care mouth rinse  15 mL Mouth Rinse PRN Althia Atlas, MD  pantoprazole  (PROTONIX ) EC tablet 40 mg  40 mg Oral Daily Duncan, Hazel V, MD   40 mg at 07/27/23 1020   sodium chloride  flush (NS) 0.9 % injection 3 mL  3 mL Intravenous Q12H Lanetta Pion, MD   3 mL at 07/27/23 1021   Facility-Administered Medications Ordered in Other Encounters  Medication Dose Route Frequency Provider Last Rate Last Admin   epoetin  alfa-epbx (RETACRIT ) injection 40,000 Units  40,000 Units Subcutaneous Q21 days Rao, Archana C, MD   40,000 Units at 07/09/23 1153     Discharge Medications: Please see discharge summary for a list of discharge medications.  Relevant Imaging Results:  Relevant Lab Results:   Additional Information [id number]  Crayton Docker, RN

## 2023-07-27 NOTE — TOC Progression Note (Signed)
 Transition of Care [facility]) - Progression Note    Patient Details  Name: Nicholas Weiss MRN: [id number] Date of Birth: [birth year]  Transition of Care [facility]) CM/SW Contact  Crayton Docker, RN Phone Number: 07/27/2023, 4:44 PM  Clinical Narrative:      CM call placed to [facility] regarding home health PT/OT. Per Louanna Rouse, agency can accept.  Expected Discharge Plan: Home/Self Care Barriers to Discharge: Continued Medical Work up  Expected Discharge Plan and Services   Discharge Planning Services: CM Consult   Living arrangements for the past 2 months: Single Family Home                                       Social Determinants of Health (SDOH) Interventions SDOH Screenings   Food Insecurity: No Food Insecurity (07/22/2023)  Housing: Low Risk  (07/22/2023)  Transportation Needs: No Transportation Needs (07/22/2023)  Utilities: Not At Risk (07/22/2023)  Alcohol Screen: Low Risk  (10/02/2020)  Depression (PHQ2-9): Medium Risk (10/02/2020)  Financial Resource Strain: Low Risk  (05/19/2023)   Received from [facility]  Physical Activity: Insufficiently Active (01/01/2020)  Social Connections: Patient Declined (07/22/2023)  Stress: No Stress Concern Present (01/01/2020)  Tobacco Use: Low Risk  (07/22/2023)  Recent Concern: Tobacco Use - Medium Risk (07/09/2023)   Received from [facility]    Readmission Risk Interventions    02/16/2023    3:56 PM 08/24/2022   10:57 AM 05/20/2022   11:41 AM  Readmission Risk Prevention Plan  Transportation Screening Complete Complete Complete  Medication Review Oceanographer) Complete Complete Complete  PCP or Specialist appointment within 3-5 days of discharge Complete Complete Complete  HRI or Home Care Consult Complete Complete Complete  SW Recovery Care/Counseling Consult Not Complete    Palliative Care Screening Not Applicable  Complete  Skilled Nursing Facility Complete Complete  Complete

## 2023-07-28 DIAGNOSIS — R55 Syncope and collapse: Secondary | ICD-10-CM | POA: Diagnosis not present

## 2023-07-28 DIAGNOSIS — R42 Dizziness and giddiness: Secondary | ICD-10-CM | POA: Diagnosis not present

## 2023-07-28 LAB — CBC WITH DIFFERENTIAL/PLATELET
Abs Immature Granulocytes: 0 10*3/uL (ref 0.00–0.07)
Basophils Absolute: 0 10*3/uL (ref 0.0–0.1)
Basophils Relative: 1 %
Eosinophils Absolute: 0.1 10*3/uL (ref 0.0–0.5)
Eosinophils Relative: 4 %
HCT: 31.4 % — ABNORMAL LOW (ref 39.0–52.0)
Hemoglobin: 10.6 g/dL — ABNORMAL LOW (ref 13.0–17.0)
Immature Granulocytes: 0 %
Lymphocytes Relative: 44 %
Lymphs Abs: 1.2 10*3/uL (ref 0.7–4.0)
MCH: 31.3 pg (ref 26.0–34.0)
MCHC: 33.8 g/dL (ref 30.0–36.0)
MCV: 92.6 fL (ref 80.0–100.0)
Monocytes Absolute: 0.3 10*3/uL (ref 0.1–1.0)
Monocytes Relative: 12 %
Neutro Abs: 1 10*3/uL — ABNORMAL LOW (ref 1.7–7.7)
Neutrophils Relative %: 39 %
Platelets: 189 10*3/uL (ref 150–400)
RBC: 3.39 MIL/uL — ABNORMAL LOW (ref 4.22–5.81)
RDW: 14.8 % (ref 11.5–15.5)
WBC: 2.6 10*3/uL — ABNORMAL LOW (ref 4.0–10.5)
nRBC: 0 % (ref 0.0–0.2)

## 2023-07-28 LAB — BASIC METABOLIC PANEL WITH GFR
Anion gap: 7 (ref 5–15)
BUN: 25 mg/dL — ABNORMAL HIGH (ref 8–23)
CO2: 25 mmol/L (ref 22–32)
Calcium: 8.4 mg/dL — ABNORMAL LOW (ref 8.9–10.3)
Chloride: 105 mmol/L (ref 98–111)
Creatinine, Ser: 1.07 mg/dL (ref 0.61–1.24)
GFR, Estimated: >60 mL/min (ref >60)
Glucose, Bld: 77 mg/dL (ref 70–99)
Potassium: 3.9 mmol/L (ref 3.5–5.1)
Sodium: 137 mmol/L (ref 135–145)

## 2023-07-28 LAB — GLUCOSE, CAPILLARY
Glucose-Capillary: 76 mg/dL (ref 70–99)
Glucose-Capillary: 102 mg/dL — ABNORMAL HIGH (ref 70–99)
Glucose-Capillary: 59 mg/dL — ABNORMAL LOW (ref 70–99)
Glucose-Capillary: 222 mg/dL — ABNORMAL HIGH (ref 70–99)
Glucose-Capillary: 113 mg/dL — ABNORMAL HIGH (ref 70–99)
Glucose-Capillary: 103 mg/dL — ABNORMAL HIGH (ref 70–99)

## 2023-07-28 MED ORDER — INSULIN GLARGINE-YFGN 100 UNIT/ML ~~LOC~~ SOLN
5.0000 [IU] | Freq: Every day | SUBCUTANEOUS | Status: DC
  Administered 2023-07-28 – 2023-07-29 (×2): 5 [IU] via SUBCUTANEOUS
  Filled 2023-07-28 (×2): qty 0.05

## 2023-07-28 MED ORDER — MIDODRINE HCL 5 MG PO TABS
5.0000 mg | ORAL_TABLET | Freq: Once | ORAL | Status: AC
Start: 2023-07-28 — End: 2023-07-28
  Administered 2023-07-28: 5 mg via ORAL
  Filled 2023-07-28: qty 1

## 2023-07-28 MED ORDER — SODIUM CHLORIDE 0.9 % IV BOLUS
500.0000 mL | Freq: Once | INTRAVENOUS | Status: AC
  Administered 2023-07-28: 500 mL via INTRAVENOUS

## 2023-07-28 NOTE — Progress Notes (Signed)
 Hi Dr. Achilles Holes. I hope you are doing well. I wanted to report that patient independently, climbed the rail & ambulated to the bathroom(w/o binder). He was found in the restroom and he reported his legs felt like jelly. When turning he had an assisted fall by RN; as a result syncopal episode. No injury or pain but reported his legs felt like "jelly".Patient was returned to bed via wheelchair. Vital signs WDL. Fall risk education reinforced. Patient encouraged to use urinal and call bed. Bed alarm re-engaged.

## 2023-07-28 NOTE — Plan of Care (Signed)
  Problem: Education: Goal: Ability to describe self-care measures that may prevent or decrease complications (Diabetes Survival Skills Education) will improve Outcome: Progressing Goal: Individualized Educational Video(s) Outcome: Progressing   Problem: Coping: Goal: Ability to adjust to condition or change in health will improve Outcome: Progressing   Problem: Fluid Volume: Goal: Ability to maintain a balanced intake and output will improve Outcome: Progressing   Problem: Health Behavior/Discharge Planning: Goal: Ability to identify and utilize available resources and services will improve Outcome: Progressing Goal: Ability to manage health-related needs will improve Outcome: Progressing   Problem: Metabolic: Goal: Ability to maintain appropriate glucose levels will improve Outcome: Progressing   Problem: Nutritional: Goal: Maintenance of adequate nutrition will improve Outcome: Progressing Goal: Progress toward achieving an optimal weight will improve Outcome: Progressing   Problem: Tissue Perfusion: Goal: Adequacy of tissue perfusion will improve Outcome: Progressing   Problem: Education: Goal: Knowledge of condition and prescribed therapy will improve Outcome: Progressing   Problem: Cardiac: Goal: Will achieve and/or maintain adequate cardiac output Outcome: Progressing

## 2023-07-28 NOTE — Progress Notes (Signed)
 Occupational Therapy Treatment Patient Details Name: Nicholas Weiss MRN: [id number] DOB: Aug 31, 1950 Today's Date: 07/28/2023   History of present illness Pt is a 73 year old male admitted due to multiple recent falls, presyncope and inability of caregiver to continue caring for him.     PMH significant for hypertension, prior CVA, multiple myeloma, CKD 3B, paroxysmal A-fib, type 1 diabetes mellitus, A-fib on Eliquis , myoclonus on Keppra , dysautonomia/orthostatic hypotension, with recurrent syncope, frequent falls   OT comments  Pt is supine in bed on arrival. Pleasant and agreeable to OT session. He denies pain. Orthostatic vitals taken during session and reported to MD:  BP- Lying BP- Sitting BP- Standing at 0 minutes  07/28/23 1213 98/73 -- --  07/28/23 1212 127/79 90/74 (!) 68/51    Pt performed bed mobility with supervision. Pt required CGA for STS with initial posterior lean and progression to supervision. Due to low BP, did not progress mobility although pt was not symptomatic. Edu on BUE strengthening exercises at bed level to maximize strength/endurance to prevent decline. Pt returned to bed with all needs in place and will cont to require skilled acute OT services to maximize his safety and IND to return to PLOF.       If plan is discharge home, recommend the following:  Assistance with cooking/housework;Supervision due to cognitive status;A little help with bathing/dressing/bathroom;A little help with walking and/or transfers   Equipment Recommendations  Wheelchair cushion (measurements OT)    Recommendations for Other Services      Precautions / Restrictions Precautions Precautions: Fall Recall of Precautions/Restrictions: Impaired Precaution/Restrictions Comments: orthostatic; reports >5 falls in last month Restrictions Weight Bearing Restrictions Per Provider Order: No       Mobility Bed Mobility Overal bed mobility: Needs Assistance Bed Mobility: Supine to Sit,  Sit to Supine     Supine to sit: Supervision, Used rails, HOB elevated Sit to supine: Supervision, Used rails        Transfers Overall transfer level: Needs assistance Equipment used: Straight cane, None Transfers: Sit to/from Stand Sit to Stand: Contact guard assist           General transfer comment: post lean with standing, but progressed to standing with SUP during BP check-d/t low reading did not progress mobility     Balance Overall balance assessment: Needs assistance Sitting-balance support: Feet supported Sitting balance-Leahy Scale: Fair     Standing balance support: During functional activity, Reliant on assistive device for balance, Single extremity supported Standing balance-Leahy Scale: Fair Standing balance comment: SPC use                           ADL either performed or assessed with clinical judgement   ADL Overall ADL's : Needs assistance/impaired                     Lower Body Dressing: Maximal assistance Lower Body Dressing Details (indicate cue type and reason): to don bil socks and abdominal binder                    Extremity/Trunk Assessment              Vision       Perception     Praxis     Communication Communication Communication: No apparent difficulties   Cognition Arousal: Alert Behavior During Therapy: Flat affect  Following commands: Impaired Following commands impaired: Follows one step commands with increased time      Cueing   Cueing Techniques: Verbal cues, Gestural cues, Tactile cues, Visual cues  Exercises Other Exercises Other Exercises: Edu on BUE strengthening exercises at bed level to perform daily to prevent further weakness.    Shoulder Instructions       General Comments      Pertinent Vitals/ Pain       Pain Assessment Pain Assessment: No/denies pain  Home Living                                           Prior Functioning/Environment              Frequency  Min 2X/week        Progress Toward Goals  OT Goals(current goals can now be found in the care plan section)  Progress towards OT goals: Progressing toward goals  Acute Rehab OT Goals Patient Stated Goal: improve function/strength OT Goal Formulation: With patient Time For Goal Achievement: 08/06/23 Potential to Achieve Goals: Good  Plan      Co-evaluation                 AM-PAC OT "6 Clicks" Daily Activity     Outcome Measure   Help from another person eating meals?: None Help from another person taking care of personal grooming?: None Help from another person toileting, which includes using toliet, bedpan, or urinal?: A Little Help from another person bathing (including washing, rinsing, drying)?: A Lot Help from another person to put on and taking off regular upper body clothing?: None Help from another person to put on and taking off regular lower body clothing?: A Lot 6 Click Score: 19    End of Session Equipment Utilized During Treatment:  (SPC)  OT Visit Diagnosis: Unsteadiness on feet (R26.81);Other abnormalities of gait and mobility (R26.89)   Activity Tolerance Patient tolerated treatment well;Treatment limited secondary to medical complications (Comment) (low BP)   Patient Left in bed;with call bell/phone within reach;with bed alarm set   Nurse Communication Mobility status        Time: 8295-6213 OT Time Calculation (min): 19 min  Charges: OT General Charges $OT Visit: 1 Visit OT Treatments $Therapeutic Activity: 8-22 mins  Nikie Cid, OTR/L  07/28/23, 12:30 PM   Shailee Foots E Oden Lindaman 07/28/2023, 12:21 PM

## 2023-07-28 NOTE — Inpatient Diabetes Management (Signed)
 Inpatient Diabetes Program Recommendations  AACE/ADA: New Consensus Statement on Inpatient Glycemic Control (2015)  Target Ranges:  Prepandial:   less than 140 mg/dL      Peak postprandial:   less than 180 mg/dL (1-2 hours)      Critically ill patients:  140 - 180 mg/dL   Lab Results  Component Value Date   GLUCAP 59 (L) 07/28/2023   HGBA1C 10.0 (H) 07/21/2023    Review of Glycemic Control  Latest Reference Range & Units 07/27/23 12:39 07/27/23 16:38 07/27/23 20:37 07/28/23 04:48 07/28/23 08:38  Glucose-Capillary 70 - 99 mg/dL 161 (H) 096 (H) 045 (H) 76 59 (L)  (H): Data is abnormally high (L): Data is abnormally low Diabetes history: DM 1 Outpatient Diabetes medications:  Insulin  pump at home (T-slim) Basal rate 12am 0.25 units/hr (24hr = 8 units) Bolus settings ISF 60 I:C ratio 20 Target 120  Off pump regimen: Novolog  1-9 units tid with meals  Tresiba  5 units daily Current orders for Inpatient glycemic control:  Novolog  0-6 units tid with meals and HS Semglee  4 units BID Florinef  0.1 mg every day   Inpatient Diabetes Program Recommendations:   Patient continues with hypoglycemia this AM.  Consider decreasing Semglee  further to 5 units every day ( to start on 5/1)  Thanks, Marjo Sievert, MSN, RNC-OB Diabetes Coordinator [phone] (8a-5p)

## 2023-07-28 NOTE — Plan of Care (Signed)
   Problem: Education: Goal: Ability to describe self-care measures that may prevent or decrease complications (Diabetes Survival Skills Education) will improve Outcome: Progressing   Problem: Nutritional: Goal: Maintenance of adequate nutrition will improve Outcome: Progressing

## 2023-07-28 NOTE — Progress Notes (Signed)
 Progress Note   Patient: Nicholas Weiss MVH:[id number] DOB: May 06, 1950 DOA: 07/21/2023     5 DOS: the patient was seen and examined on 07/28/2023   Brief hospital course: Nicholas Weiss is a 73 y.o. male with medical history significant for hypertension, prior CVA, multiple myeloma, CKD 3B, paroxysmal A-fib, type 1 diabetes mellitus, A-fib on Eliquis , myoclonus on Keppra , dysautonomia/orthostatic hypotension, with recurrent syncope, frequent falls being admitted due to multiple recent falls, presyncope and inability of caregiver to continue caring for him.   Assessment and Plan:  # Frequent falls # Severe Orthostatic hypotension/ dysautonomia # History of injury (brain bleed, rib fractures) secondary to recurrent syncope with collapse Patient had full syncope workup during prior hospitalizations with the following results: - CTA head and neck 01/2023 with 50% stenosis in the proximal bilateral ICAs, just distal to the bifurcations.Severe stenosis at the origin of the right vertebral artery -Echo 02/2023 with EF 65 to 70%, G1 DD, no significant valvulopathy BP very labile, this AM elevated SBP > 180, got hydralazine  dropped to 60s. Fluid bolus ordered. Continue TED hose. Continue Florinef  with holding parameters. He is not on midodrine  at home. Fall precautions and up with assistance Cardiac monitoring PT/ OT evaluation once BP improved. TOC consult for placement. 4/29 advised to PT to use abdominal binder and compression stockings during ambulation to support BP 4/29 orthostatics with TED hose and binder on: sup: 164/102 sit: 122/87 stand: 101/62 after walking entire lap: 100/65  4/30 significantly out of the hypotension, symptomatic, NS 500 mL bolus and midodrine  5 mg one-time dose given.  Patient is not stable to discharge today     # Type 1 diabetes Sugars stable. S/p Semglee  5 units BID with sliding scale coverage. RN notified that his insulin  pump is removed. Diabetes educator  on board. 4/28 hypoglycemia episode, decreased Semglee  4 units twice daily and sensitive sliding scale  4/30 decreased Semglee  5 units daily due to hypoglycemia as per DM coordinator  # CKD 3B Creatinine at baseline Monitor renal function and avoid nephrotoxins   # Atrial fibrillation # Chronic anticoagulation Continue Eliquis  Not on beta-blocker due to chronic hypotension   # Prolonged QT Avoid QT prolonging drugs.  # Myoclonus Continue Keppra    # Anemia of chronic kidney failure, stage 3 (moderate)  Hemoglobin at baseline. No active bleeding.    # Leukopenia, and neutropenia, afebrile, monitor off antibiotics.   It could be secondary to h/o multiple myeloma Continue to monitor CBC   # Essential (primary) hypertension BP stable.   Need to keep elevated due to orthostatic hypotension Hold hydralazine  for now.   # Multiple myeloma  No acute issues suspected    Out of bed to chair. Incentive spirometry. Nursing supportive care. Fall, aspiration precautions. Diet:  Diet Orders (From admission, onward)     Start     Ordered   07/21/23 2101  Diet heart healthy/carb modified Room service appropriate? Yes; Fluid consistency: Thin  Diet effective now       Question Answer Comment  Diet-HS Snack? Nothing   Room service appropriate? Yes   Fluid consistency: Thin      07/21/23 2101           DVT prophylaxis: Place TED hose Start: 07/21/23 2118 apixaban  (ELIQUIS ) tablet 5 mg  Level of care: Telemetry Medical   Code Status: Full Code  Subjective: Patient was seen and examined today morning.  Patient was lying comfortably in the bed, denied any active issues. While checking  orthostatics he became symptomatic and feeling dizzy and sweating, could not stand for 3 minutes to check the blood pressure while standing.  We will give NS fluid bolus and 1 dose of midodrine .  Discharge was canceled and OT recommended SNF placement We will consult PT as well and informed to  [facility] regarding SNF placement   Physical Exam: Vitals:   07/28/23 0900 07/28/23 1231 07/28/23 1233 07/28/23 1241  BP: 133/72 (!) 88/70 (!) 52/42 90/60  Pulse: 64 85 (!) 106 98  Resp: 16     Temp: 97.9 F (36.6 C) 98.3 F (36.8 C)    TempSrc: Oral Oral    SpO2:  99% 100%   Weight:      Height:        General - Elderly African-American male, no apparent distress HEENT - PERRLA, EOMI, atraumatic head, non tender sinuses. Lung - Clear, no rales, rhonchi, wheezes. Heart - S1, S2 heard, no murmurs, rubs, trace pedal edema. Abdomen - Soft, non tender, bowel sounds good Neuro - Sleeping, arousable, able to answer me, non focal exam. Skin - Warm and dry.  Data Reviewed:      Latest Ref Rng & Units 07/28/2023    4:51 AM 07/27/2023    5:17 AM 07/26/2023    5:44 AM  CBC  WBC 4.0 - 10.5 K/uL 2.6  3.9  3.7   Hemoglobin 13.0 - 17.0 g/dL 65.7  9.9  84.6   Hematocrit 39.0 - 52.0 % 31.4  28.9  31.7   Platelets 150 - 400 K/uL 189  175  197       Latest Ref Rng & Units 07/28/2023    4:51 AM 07/27/2023    5:17 AM 07/26/2023    5:44 AM  BMP  Glucose 70 - 99 mg/dL 77  55  962   BUN 8 - 23 mg/dL 25  28  28    Creatinine 0.61 - 1.24 mg/dL 9.52  8.41  3.24   Sodium 135 - 145 mmol/L 137  135  135   Potassium 3.5 - 5.1 mmol/L 3.9  3.5  4.4   Chloride 98 - 111 mmol/L 105  104  106   CO2 22 - 32 mmol/L 25  24  22    Calcium  8.9 - 10.3 mg/dL 8.4  8.6  8.4    No results found.   Family Communication: Discussed with patient, he understand and agree. All questions answered. 4/30 d/w patient wife over the phone, management plan discussed.  She would like him to go to SNF and would like to talk to Child psychotherapist.  TOC informed.   Disposition: Status is: Inpatient The patient will require care spanning > 2 midnights and should be moved to inpatient because: hypotension, IV fluid resuscitation, PT eval. Planned Discharge Destination: Rehab   Time spent: 40 minutes  Author: Althia Atlas,  MD 07/28/2023 1:14 PM Secure chat 7am to 7pm For on call review [email or web address].

## 2023-07-28 NOTE — Progress Notes (Signed)
 Patient is alert and oriented X 2. While doing orthostatic, his blood pressure dropped to 52/42 while standing feels very dizzy and sweaty. His blood pressure went back up to 90/60 with return to bed. not able to sand for 3 minutes to check the blood pressure while standing. MD made aware.  Got new order. Plan of care ongoing.

## 2023-07-28 NOTE — TOC Transition Note (Signed)
 Transition of Care [facility]) - Discharge Note   Patient Details  Name: Nicholas Weiss MRN: [id number] Date of Birth: December 25, 1950  Transition of Care [facility]) CM/SW Contact:  Crayton Docker, RN 07/28/2023, 12:15 PM   Clinical Narrative:     Discharge orders for home with home health noted. Patient to discharge via private transportation arranged. Home health orders noted. CM call to patient's significant other, [name], phone: [phone] regarding pending discharge, unable to leave message. CM call to [facility] regarding pending discharge.   Final next level of care: Home w Home Health Services Barriers to Discharge: No Barriers Identified   Patient Goals and CMS Choice    Home with home health  Discharge Placement    Home with home health            Discharge Plan and Services Additional resources added to the After Visit Summary for     Discharge Planning Services: CM Consult    HH Arranged: PT, OT [facility] Agency: [facility] Date [facility] Agency Contacted: 07/27/23 Time HH Agency Contacted: [id number] Representative spoke with at [facility] Agency: Louanna Rouse  Social Drivers of Health (SDOH) Interventions SDOH Screenings   Food Insecurity: No Food Insecurity (07/22/2023)  Housing: Low Risk  (07/22/2023)  Transportation Needs: No Transportation Needs (07/22/2023)  Utilities: Not At Risk (07/22/2023)  Alcohol Screen: Low Risk  (10/02/2020)  Depression (PHQ2-9): Medium Risk (10/02/2020)  Financial Resource Strain: Low Risk  (05/19/2023)   Received from [facility]  Physical Activity: Insufficiently Active (01/01/2020)  Social Connections: Patient Declined (07/22/2023)  Stress: No Stress Concern Present (01/01/2020)  Tobacco Use: Low Risk  (07/22/2023)  Recent Concern: Tobacco Use - Medium Risk (07/09/2023)   Received from [facility]     Readmission Risk Interventions    02/16/2023    3:56 PM 08/24/2022   10:57 AM 05/20/2022   11:41 AM   Readmission Risk Prevention Plan  Transportation Screening Complete Complete Complete  Medication Review Oceanographer) Complete Complete Complete  PCP or Specialist appointment within 3-5 days of discharge Complete Complete Complete  HRI or Home Care Consult Complete Complete Complete  SW Recovery Care/Counseling Consult Not Complete    Palliative Care Screening Not Applicable  Complete  Skilled Nursing Facility Complete Complete Complete

## 2023-07-29 DIAGNOSIS — R42 Dizziness and giddiness: Secondary | ICD-10-CM | POA: Diagnosis not present

## 2023-07-29 DIAGNOSIS — R55 Syncope and collapse: Secondary | ICD-10-CM | POA: Diagnosis not present

## 2023-07-29 LAB — GLUCOSE, CAPILLARY
Glucose-Capillary: 70 mg/dL (ref 70–99)
Glucose-Capillary: 182 mg/dL — ABNORMAL HIGH (ref 70–99)
Glucose-Capillary: 235 mg/dL — ABNORMAL HIGH (ref 70–99)
Glucose-Capillary: 218 mg/dL — ABNORMAL HIGH (ref 70–99)

## 2023-07-29 LAB — BASIC METABOLIC PANEL WITH GFR
Anion gap: 4 — ABNORMAL LOW (ref 5–15)
BUN: 25 mg/dL — ABNORMAL HIGH (ref 8–23)
CO2: 25 mmol/L (ref 22–32)
Calcium: 8.2 mg/dL — ABNORMAL LOW (ref 8.9–10.3)
Chloride: 108 mmol/L (ref 98–111)
Creatinine, Ser: 1.24 mg/dL (ref 0.61–1.24)
GFR, Estimated: >60 mL/min (ref >60)
Glucose, Bld: 76 mg/dL (ref 70–99)
Potassium: 4.2 mmol/L (ref 3.5–5.1)
Sodium: 137 mmol/L (ref 135–145)

## 2023-07-29 LAB — CBC WITH DIFFERENTIAL/PLATELET
Abs Immature Granulocytes: 0 10*3/uL (ref 0.00–0.07)
Basophils Absolute: 0 10*3/uL (ref 0.0–0.1)
Basophils Relative: 1 %
Eosinophils Absolute: 0.1 10*3/uL (ref 0.0–0.5)
Eosinophils Relative: 5 %
HCT: 29.5 % — ABNORMAL LOW (ref 39.0–52.0)
Hemoglobin: 10.1 g/dL — ABNORMAL LOW (ref 13.0–17.0)
Immature Granulocytes: 0 %
Lymphocytes Relative: 47 %
Lymphs Abs: 1.4 10*3/uL (ref 0.7–4.0)
MCH: 31.3 pg (ref 26.0–34.0)
MCHC: 34.2 g/dL (ref 30.0–36.0)
MCV: 91.3 fL (ref 80.0–100.0)
Monocytes Absolute: 0.3 10*3/uL (ref 0.1–1.0)
Monocytes Relative: 10 %
Neutro Abs: 1.1 10*3/uL — ABNORMAL LOW (ref 1.7–7.7)
Neutrophils Relative %: 37 %
Platelets: 201 10*3/uL (ref 150–400)
RBC: 3.23 MIL/uL — ABNORMAL LOW (ref 4.22–5.81)
RDW: 14.6 % (ref 11.5–15.5)
WBC: 2.9 10*3/uL — ABNORMAL LOW (ref 4.0–10.5)
nRBC: 0 % (ref 0.0–0.2)

## 2023-07-29 NOTE — Plan of Care (Signed)
   Problem: Education: Goal: Ability to describe self-care measures that may prevent or decrease complications (Diabetes Survival Skills Education) will improve Outcome: Progressing   Problem: Coping: Goal: Ability to adjust to condition or change in health will improve Outcome: Progressing   Problem: Fluid Volume: Goal: Ability to maintain a balanced intake and output will improve Outcome: Progressing

## 2023-07-29 NOTE — Discharge Summary (Signed)
 Nicholas Weiss HYQ:[id number] DOB: 1950-11-18 DOA: 07/21/2023  PCP: Monique Ano, MD  Admit date: 07/21/2023 Discharge date: 07/29/2023  Time spent: 35 minutes  Recommendations for Outpatient Follow-up:  Close pcp f/u  Cardiology f/u as well given ongoing orthostatic hypotension    Discharge Diagnoses:  Principal Problem:   Postural dizziness with presyncope Active Problems:   Syncope and collapse, recurrent   Falls   Hypotension   Type 1 diabetes mellitus with diabetic neuropathy, unspecified (HCC)   Hypertension   Myoclonus   History of CVA (cerebrovascular accident)   Multiple myeloma (HCC)   Prolonged QT interval   Discharge Condition: stable  Diet recommendation: carb modified  Filed Weights   07/28/23 0540 07/29/23 0500 07/29/23 0600  Weight: 66.1 kg 66 kg 66.3 kg    History of present illness:  From admission h and p Nicholas Weiss is a 73 y.o. male with medical history significant for hypertension, prior CVA, multiple myeloma, CKD 3B, paroxysmal A-fib, type 1 diabetes mellitus, A-fib on Eliquis , myoclonus on Keppra , dysautonomia/orthostatic hypotension, with recurrent syncope, frequent falls being admitted due to multiple recent falls, presyncope and inability of caregiver to continue caring for him. ED course and data review: BP 84/53 on arrival with otherwise normal vitals, improving to 179/98 with fluid bolus.  Possible orthostasis  Hospital Course:  Patient presents with symptomatic orthostatic hypotension. Was taking florinef  prn at home. Here we made that standing. Evaluated by pt/ot, initially advised snf but patient progressed and recs now home health pt/ot which was ordered. Non-pharmacologic treatments of orthostasis also reviewed with the patient. Advise close f/u with pcp as well as cardiology. Other chronic medical conditions stable.   Procedures: none   Consultations: none  Discharge Exam: Vitals:   07/29/23 0141 07/29/23 0600  BP: (!)  137/95 (!) 149/93  Pulse: 70 68  Resp:  15  Temp:  98.1 F (36.7 C)  SpO2:  100%    General: NAD Cardiovascular: RRR Respiratory: CTAB  Discharge Instructions   Discharge Instructions     Call MD for:   Complete by: As directed    Orthostatic BP and uncontrolled blood glucose   Call MD for:  extreme fatigue   Complete by: As directed    Call MD for:  persistant dizziness or light-headedness   Complete by: As directed    Diet - low sodium heart healthy   Complete by: As directed    Diet - low sodium heart healthy   Complete by: As directed    Discharge instructions   Complete by: As directed    F/u PCP in 1 wk, monitor BP and FSBG at home   Increase activity slowly   Complete by: As directed    Increase activity slowly   Complete by: As directed       Allergies as of 07/29/2023       Reactions   Penicillins Other (See Comments)   Childhood allergy -tolerated amoxil  03/2022 Unknown reaction Has patient had a PCN reaction causing immediate rash, facial/tongue/throat swelling, SOB or lightheadedness with hypotension: No Has patient had a PCN reaction causing severe rash involving mucus membranes or skin necrosis: No Has patient had a PCN reaction that required hospitalization No Has patient had a PCN reaction occurring within the last 10 years: No If all of the above answers are "NO", then may proceed with Cephalosporin use.        Medication List     STOP taking these medications  hydrALAZINE  25 MG tablet Commonly known as: APRESOLINE    pomalidomide  2 MG capsule Commonly known as: POMALYST        TAKE these medications    acetaminophen  500 MG tablet Commonly known as: TYLENOL  Take 1,000 mg by mouth daily as needed for mild pain or moderate pain.   apixaban  5 MG Tabs tablet Commonly known as: ELIQUIS  Take 1 tablet (5 mg total) by mouth 2 (two) times daily. Start Eliquis  after 2 weeks which will be 03/02/2023   ascorbic acid  500 MG tablet Commonly  known as: VITAMIN C  Take 1 tablet (500 mg total) by mouth daily.   atorvastatin  80 MG tablet Commonly known as: LIPITOR  Take 1 tablet (80 mg total) by mouth at bedtime. Hold while taking Paxlovid    bisacodyl  5 MG EC tablet Commonly known as: bisacodyl  Take 1 tablet (5 mg total) by mouth at bedtime. Skip the dose if no constipation   Dexcom G7 Sensor Misc 1 each by Does not apply route 4 (four) times daily -  before meals and at bedtime.   fludrocortisone  0.1 MG tablet Commonly known as: FLORINEF  Take 1 tablet (0.1 mg total) by mouth daily. Skip the dose if SBP >130   gabapentin  300 MG capsule Commonly known as: NEURONTIN  Take 300 mg by mouth 2 (two) times daily.   insulin  lispro 100 UNIT/ML KwikPen Commonly known as: HUMALOG   iron  polysaccharides 150 MG capsule Commonly known as: NIFEREX Take 1 capsule (150 mg total) by mouth daily.   levETIRAcetam  500 MG tablet Commonly known as: KEPPRA  Take 1 tablet by mouth twice daily   multivitamin with minerals Tabs tablet Take 1 tablet by mouth daily.   NovoLOG  100 UNIT/ML injection Generic drug: insulin  aspart Sliding Scale: Glucose 70 - 120: 0 units  Glucose 121 - 150: 1 unit  Glucose 151 - 200: 2 units Glucose 201 - 250: 3 units  Glucose 251 - 300: 5 units  Glucose 301 - 350: 7 units  Glucose 351 - 400: 9 units  Glucose > 400: call your docotr   Insulin  Aspart FlexPen 100 UNIT/ML Commonly known as: NOVOLOG  Sliding Scale: Glucose 70 - 120: 0 units Glucose 121 - 150: 1 unit Glucose 151 - 200: 2 units Glucose 201 - 250: 3 units Glucose 251 - 300: 5 units Glucose 301 - 350: 7 units Glucose 351 - 400: 9 units Glucose > 400: call your docotr   ondansetron  8 MG tablet Commonly known as: ZOFRAN  Take 8 mg by mouth daily as needed for nausea or vomiting.   pantoprazole  40 MG tablet Commonly known as: PROTONIX  Take 40 mg by mouth daily.   polyethylene glycol 17 g packet Commonly known as: MiraLax  Take 17 g by mouth 2  (two) times daily. Skip the dose if no constipation   traZODone  50 MG tablet Commonly known as: DESYREL  Take 0.5 tablets (25 mg total) by mouth at bedtime as needed for sleep.   Tresiba  FlexTouch 100 UNIT/ML FlexTouch Pen Generic drug: insulin  degludec Inject 5 Units into the skin daily. Home med.       Allergies  Allergen Reactions   Penicillins Other (See Comments)    Childhood allergy -tolerated amoxil  03/2022 Unknown reaction Has patient had a PCN reaction causing immediate rash, facial/tongue/throat swelling, SOB or lightheadedness with hypotension: No Has patient had a PCN reaction causing severe rash involving mucus membranes or skin necrosis: No Has patient had a PCN reaction that required hospitalization No Has patient had a PCN reaction occurring within  the last 10 years: No If all of the above answers are "NO", then may proceed with Cephalosporin use.     Follow-up Information     Monique Ano, MD Follow up.   Specialty: Family Medicine Why: hospital follow up Contact information: [address] [facility] [place] [address] [phone]         [facility] Follow up.   Why: 04/29--Per Louanna Rouse, following for discharge Contact information: [address] [place] [address] [phone]                  The results of significant diagnostics from this hospitalization (including imaging, microbiology, ancillary and laboratory) are listed below for reference.    Significant Diagnostic Studies: CT Cervical Spine Wo Contrast Result Date: 07/21/2023 CLINICAL DATA:  Neck trauma.  Fall. EXAM: CT CERVICAL SPINE WITHOUT CONTRAST TECHNIQUE: Multidetector CT imaging of the cervical spine was performed without intravenous contrast. Multiplanar CT image reconstructions were also generated. RADIATION DOSE REDUCTION: This exam was performed according to the departmental dose-optimization program which includes  automated exposure control, adjustment of the mA and/or kV according to patient size and/or use of iterative reconstruction technique. COMPARISON:  CT of the cervical spine 05/18/2023. FINDINGS: Alignment: Slight degenerative retrolisthesis is again noted at C5-6 and C6-7. No other significant listhesis is present. Straightening of the normal cervical lordosis is present. Skull base and vertebrae: The craniocervical junction is normal. The vertebral body heights are normal. No acute fracture is present. Soft tissues and spinal canal: No prevertebral fluid or swelling. No visible canal hematoma. Atherosclerotic calcifications are present at the carotid bifurcations bilaterally with left greater than right proximal ICA stenosis. Disc levels: Facet hypertrophy and uncovertebral spurring result in moderate left foraminal stenosis at C3-4 and C4-5. Moderate foraminal narrowing is present bilaterally, left greater than right at C5-6 and C6-7. No significant interval change is present. Upper chest: The lung apices are clear. The thoracic inlet is within normal limits. IMPRESSION: 1. No acute fracture or traumatic subluxation. 2. Multilevel degenerative changes of the cervical spine as described. 3. Left greater than right proximal ICA stenosis. Electronically Signed   By: Audree Leas M.D.   On: 07/21/2023 17:14   CT Head Wo Contrast Result Date: 07/21/2023 CLINICAL DATA:  Fall. Patient is on blood thinners. Trauma to head. No loss of consciousness. EXAM: CT HEAD WITHOUT CONTRAST TECHNIQUE: Contiguous axial images were obtained from the base of the skull through the vertex without intravenous contrast. RADIATION DOSE REDUCTION: This exam was performed according to the departmental dose-optimization program which includes automated exposure control, adjustment of the mA and/or kV according to patient size and/or use of iterative reconstruction technique. COMPARISON:  CT head without contrast 05/18/2023. FINDINGS:  Brain: Extensive calcifications are again noted along the inter cerebral falx and tentorium. No acute infarct, hemorrhage, or mass lesion is present. Periventricular and subcortical white matter changes are mildly advanced for age, similar the prior study. Deep brain nuclei are within normal limits. The brainstem and cerebellum are within normal limits. Midline structures are within normal limits. Vascular: Atherosclerotic calcifications are present within the cavernous internal carotid arteries bilaterally. No hyperdense vessel is present. Skull: Calvarium is intact. No focal lytic or blastic lesions are present. No significant extracranial soft tissue lesion is present. Sinuses/Orbits: The paranasal sinuses and mastoid air cells are clear. The globes and orbits are within normal limits. IMPRESSION: 1. No acute intracranial abnormality or significant interval change. 2.  Periventricular and subcortical white matter changes are mildly advanced for age, similar the prior study. This likely reflects the sequela of chronic microvascular ischemia. Electronically Signed   By: Audree Leas M.D.   On: 07/21/2023 17:11    Microbiology: No results found for this or any previous visit (from the past 240 hours).   Labs: Basic Metabolic Panel: Recent Labs  Lab 07/24/23 0509 07/25/23 0443 07/26/23 0544 07/27/23 0517 07/28/23 0451 07/29/23 0449  NA 136 134* 135 135 137 137  K 4.3 4.1 4.4 3.5 3.9 4.2  CL 104 105 106 104 105 108  CO2 25 23 22 24 25 25   GLUCOSE 145* 79 232* 55* 77 76  BUN 33* 28* 28* 28* 25* 25*  CREATININE 1.60* 1.43* 1.33* 1.17 1.07 1.24  CALCIUM  8.0* 8.3* 8.4* 8.6* 8.4* 8.2*  MG 1.7 1.8 1.9 1.9  --   --   PHOS 2.9 3.2 2.7 3.3  --   --    Liver Function Tests: No results for input(s): "AST", "ALT", "ALKPHOS", "BILITOT", "PROT", "ALBUMIN" in the last 168 hours. No results for input(s): "LIPASE", "AMYLASE" in the last 168 hours. No results for input(s): "AMMONIA " in the last 168  hours. CBC: Recent Labs  Lab 07/25/23 0443 07/26/23 0544 07/27/23 0517 07/28/23 0451 07/29/23 0449  WBC 2.5* 3.7* 3.9* 2.6* 2.9*  NEUTROABS 0.7* 1.3* 1.7 1.0* 1.1*  HGB 10.2* 10.8* 9.9* 10.6* 10.1*  HCT 30.8* 31.7* 28.9* 31.4* 29.5*  MCV 91.9 90.6 92.0 92.6 91.3  PLT 184 197 175 189 201   Cardiac Enzymes: No results for input(s): "CKTOTAL", "CKMB", "CKMBINDEX", "TROPONINI" in the last 168 hours. BNP: BNP (last 3 results) No results for input(s): "BNP" in the last 8760 hours.  ProBNP (last 3 results) No results for input(s): "PROBNP" in the last 8760 hours.  CBG: Recent Labs  Lab 07/28/23 2017 07/29/23 0558 07/29/23 0811 07/29/23 1221 07/29/23 1548  GLUCAP 103* 70 182* 235* 218*       Signed:  Raymonde Calico MD.  [facility] 07/29/2023, 4:05 PM

## 2023-07-29 NOTE — TOC Transition Note (Signed)
 Transition of Care [facility]) - Discharge Note   Patient Details  Name: Nicholas Weiss MRN: [id number] Date of Birth: 20-Feb-1951  [facility]) CM/SW Contact:  Crayton Docker, RN Phone Number: 07/29/2023, 4:26 PM   Clinical Narrative:     Discharge orders noted, discharge summary noted. CM call placed to patient's significant other, [name], phone: [phone], no answer, CM unable to leave voicemail message. CM call to Louanna Rouse [facility], regarding pending discharge. Per Louanna Rouse, verbalized understanding and will inform home health office.   Final next level of care: Home w Home Health Services Barriers to Discharge: No Barriers Identified   Patient Goals and CMS Choice    Home   Discharge Placement               Home with home health   Discharge Plan and Services Additional resources added to the After Visit Summary for    [facility] Discharge Planning Services: CM Consult                HH Arranged: PT, OT [facility] Agency: [facility] Date [facility] Agency Contacted: 07/27/23 Time HH Agency Contacted: 1640 Representative spoke with at [facility] Agency: Louanna Rouse  Social Drivers of Health (SDOH) Interventions SDOH Screenings   Food Insecurity: No Food Insecurity (07/22/2023)  Housing: Low Risk  (07/22/2023)  Transportation Needs: No Transportation Needs (07/22/2023)  Utilities: Not At Risk (07/22/2023)  Alcohol Screen: Low Risk  (10/02/2020)  Depression (PHQ2-9): Medium Risk (10/02/2020)  Financial Resource Strain: Low Risk  (05/19/2023)   Received from [facility]  Physical Activity: Insufficiently Active (01/01/2020)  Social Connections: Patient Declined (07/22/2023)  Stress: No Stress Concern Present (01/01/2020)  Tobacco Use: Low Risk  (07/22/2023)  Recent Concern: Tobacco Use - Medium Risk (07/09/2023)   Received from [facility]     Readmission Risk Interventions    02/16/2023    3:56 PM 08/24/2022   10:57 AM  05/20/2022   11:41 AM  Readmission Risk Prevention Plan  Transportation Screening Complete Complete Complete  Medication Review Oceanographer) Complete Complete Complete  PCP or Specialist appointment within 3-5 days of discharge Complete Complete Complete  HRI or Home Care Consult Complete Complete Complete  SW Recovery Care/Counseling Consult Not Complete    Palliative Care Screening Not Applicable  Complete  Skilled Nursing Facility Complete Complete Complete

## 2023-08-06 ENCOUNTER — Other Ambulatory Visit

## 2023-08-06 ENCOUNTER — Ambulatory Visit: Admitting: Oncology

## 2023-08-06 ENCOUNTER — Ambulatory Visit

## 2023-08-07 ENCOUNTER — Other Ambulatory Visit: Payer: Self-pay | Admitting: Internal Medicine

## 2023-08-09 ENCOUNTER — Encounter: Payer: Self-pay | Admitting: Oncology

## 2023-08-10 ENCOUNTER — Other Ambulatory Visit: Payer: Self-pay

## 2023-08-13 ENCOUNTER — Inpatient Hospital Stay

## 2023-08-13 ENCOUNTER — Inpatient Hospital Stay: Attending: Oncology | Admitting: Oncology

## 2023-08-13 ENCOUNTER — Encounter: Payer: Self-pay | Admitting: Oncology

## 2023-08-13 ENCOUNTER — Other Ambulatory Visit: Payer: Self-pay

## 2023-08-13 VITALS — BP 128/81 | HR 77 | Temp 98.0°F | Resp 19 | Ht 70.5 in | Wt 150.0 lb

## 2023-08-13 DIAGNOSIS — N1831 Chronic kidney disease, stage 3a: Secondary | ICD-10-CM | POA: Insufficient documentation

## 2023-08-13 DIAGNOSIS — C9000 Multiple myeloma not having achieved remission: Secondary | ICD-10-CM | POA: Insufficient documentation

## 2023-08-13 DIAGNOSIS — D649 Anemia, unspecified: Secondary | ICD-10-CM | POA: Diagnosis not present

## 2023-08-13 DIAGNOSIS — Z5111 Encounter for antineoplastic chemotherapy: Secondary | ICD-10-CM

## 2023-08-13 DIAGNOSIS — D631 Anemia in chronic kidney disease: Secondary | ICD-10-CM | POA: Insufficient documentation

## 2023-08-13 DIAGNOSIS — N183 Chronic kidney disease, stage 3 unspecified: Secondary | ICD-10-CM

## 2023-08-13 DIAGNOSIS — Z79899 Other long term (current) drug therapy: Secondary | ICD-10-CM | POA: Diagnosis not present

## 2023-08-13 LAB — COMPREHENSIVE METABOLIC PANEL WITH GFR
ALT: 39 U/L (ref 0–44)
AST: 27 U/L (ref 15–41)
Albumin: 3.3 g/dL — ABNORMAL LOW (ref 3.5–5.0)
Alkaline Phosphatase: 58 U/L (ref 38–126)
Anion gap: 8 (ref 5–15)
BUN: 32 mg/dL — ABNORMAL HIGH (ref 8–23)
CO2: 24 mmol/L (ref 22–32)
Calcium: 8 mg/dL — ABNORMAL LOW (ref 8.9–10.3)
Chloride: 103 mmol/L (ref 98–111)
Creatinine, Ser: 1.45 mg/dL — ABNORMAL HIGH (ref 0.61–1.24)
GFR, Estimated: 52 mL/min — ABNORMAL LOW (ref >60)
Glucose, Bld: 149 mg/dL — ABNORMAL HIGH (ref 70–99)
Potassium: 4.2 mmol/L (ref 3.5–5.1)
Sodium: 135 mmol/L (ref 135–145)
Total Bilirubin: 0.5 mg/dL (ref 0.0–1.2)
Total Protein: 7.6 g/dL (ref 6.5–8.1)

## 2023-08-13 LAB — CBC WITH DIFFERENTIAL/PLATELET
Abs Immature Granulocytes: 0.01 10*3/uL (ref 0.00–0.07)
Basophils Absolute: 0 10*3/uL (ref 0.0–0.1)
Basophils Relative: 0 %
Eosinophils Absolute: 0.1 10*3/uL (ref 0.0–0.5)
Eosinophils Relative: 4 %
HCT: 23.4 % — ABNORMAL LOW (ref 39.0–52.0)
Hemoglobin: 7.5 g/dL — ABNORMAL LOW (ref 13.0–17.0)
Immature Granulocytes: 0 %
Lymphocytes Relative: 48 %
Lymphs Abs: 1.3 10*3/uL (ref 0.7–4.0)
MCH: 30.6 pg (ref 26.0–34.0)
MCHC: 32.1 g/dL (ref 30.0–36.0)
MCV: 95.5 fL (ref 80.0–100.0)
Monocytes Absolute: 0.2 10*3/uL (ref 0.1–1.0)
Monocytes Relative: 7 %
Neutro Abs: 1.2 10*3/uL — ABNORMAL LOW (ref 1.7–7.7)
Neutrophils Relative %: 41 %
Platelets: 189 10*3/uL (ref 150–400)
RBC: 2.45 MIL/uL — ABNORMAL LOW (ref 4.22–5.81)
RDW: 15.7 % — ABNORMAL HIGH (ref 11.5–15.5)
WBC: 2.8 10*3/uL — ABNORMAL LOW (ref 4.0–10.5)
nRBC: 0 % (ref 0.0–0.2)

## 2023-08-13 MED ORDER — DEXAMETHASONE 4 MG PO TABS
10.0000 mg | ORAL_TABLET | Freq: Once | ORAL | Status: AC
  Administered 2023-08-13: 10 mg via ORAL
  Filled 2023-08-13: qty 3

## 2023-08-13 MED ORDER — ACETAMINOPHEN 325 MG PO TABS
650.0000 mg | ORAL_TABLET | Freq: Once | ORAL | Status: AC
Start: 2023-08-13 — End: 2023-08-13
  Administered 2023-08-13: 650 mg via ORAL
  Filled 2023-08-13: qty 2

## 2023-08-13 MED ORDER — DARATUMUMAB-HYALURONIDASE-FIHJ 1800-30000 MG-UT/15ML ~~LOC~~ SOLN
1800.0000 mg | Freq: Once | SUBCUTANEOUS | Status: AC
  Administered 2023-08-13: 1800 mg via SUBCUTANEOUS
  Filled 2023-08-13: qty 15

## 2023-08-13 MED ORDER — ACYCLOVIR 400 MG PO TABS: 400.0000 mg | ORAL_TABLET | Freq: Two times a day (BID) | ORAL | 2 refills | Status: DC

## 2023-08-13 MED ORDER — EPOETIN ALFA-EPBX 40000 UNIT/ML IJ SOLN
40000.0000 [IU] | INTRAMUSCULAR | Status: DC
  Administered 2023-08-13: 40000 [IU] via SUBCUTANEOUS
  Filled 2023-08-13: qty 1

## 2023-08-13 MED ORDER — POMALIDOMIDE 2 MG PO CAPS: 2.0000 mg | ORAL_CAPSULE | Freq: Every day | ORAL | 0 refills | Status: DC

## 2023-08-13 MED ORDER — DIPHENHYDRAMINE HCL 25 MG PO CAPS
50.0000 mg | ORAL_CAPSULE | Freq: Once | ORAL | Status: AC
  Administered 2023-08-13: 50 mg via ORAL
  Filled 2023-08-13: qty 2

## 2023-08-13 NOTE — Patient Instructions (Signed)
 [facility] - A DEPT OF [facility]  Discharge Instructions: Thank you for choosing [facility] to provide your oncology and hematology care.  If you have a lab appointment with the Cancer Center, please go directly to the Cancer Center and check in at the registration area.  Wear comfortable clothing and clothing appropriate for easy access to any Portacath or PICC line.   We strive to give you quality time with your provider. You may need to reschedule your appointment if you arrive late (15 or more minutes).  Arriving late affects you and other patients whose appointments are after yours.  Also, if you miss three or more appointments without notifying the office, you may be dismissed from the clinic at the provider's discretion.      For prescription refill requests, have your pharmacy contact our office and allow 72 hours for refills to be completed.    Today you received the following chemotherapy and/or immunotherapy agents Darzalex      To help prevent nausea and vomiting after your treatment, we encourage you to take your nausea medication as directed.  BELOW ARE SYMPTOMS THAT SHOULD BE REPORTED IMMEDIATELY: *FEVER GREATER THAN 100.4 F (38 C) OR HIGHER *CHILLS OR SWEATING *NAUSEA AND VOMITING THAT IS NOT CONTROLLED WITH YOUR NAUSEA MEDICATION *UNUSUAL SHORTNESS OF BREATH *UNUSUAL BRUISING OR BLEEDING *URINARY PROBLEMS (pain or burning when urinating, or frequent urination) *BOWEL PROBLEMS (unusual diarrhea, constipation, pain near the anus) TENDERNESS IN MOUTH AND THROAT WITH OR WITHOUT PRESENCE OF ULCERS (sore throat, sores in mouth, or a toothache) UNUSUAL RASH, SWELLING OR PAIN  UNUSUAL VAGINAL DISCHARGE OR ITCHING   Items with * indicate a potential emergency and should be followed up as soon as possible or go to the Emergency Department if any problems should occur.  Please show the CHEMOTHERAPY ALERT CARD or IMMUNOTHERAPY  ALERT CARD at check-in to the Emergency Department and triage nurse.  Should you have questions after your visit or need to cancel or reschedule your appointment, please contact [facility] - A DEPT OF [facility]  [phone] and follow the prompts.  Office hours are 8:00 a.m. to 4:30 p.m. Monday - Friday. Please note that voicemails left after 4:00 p.m. may not be returned until the following business day.  We are closed weekends and major holidays. You have access to a nurse at all times for urgent questions. Please call the main number to the clinic [phone] and follow the prompts.  For any non-urgent questions, you may also contact your provider using MyChart. We now offer e-Visits for anyone 68 and older to request care online for non-urgent symptoms. For details visit [email or web address].   Also download the MyChart app! Go to the app store, search "MyChart", open the app, select [place], and log in with your MyChart username and password.

## 2023-08-13 NOTE — Progress Notes (Signed)
 Outbound call to patient to confirm per Dr. Randy Weiss that his still taking ayclovir in addition to aspirin  prophylactically.  Outbound call to patient; spoke to caregiver Nicholas Weiss who states the patient is taking aspirin  prophylactically but is not taking Ayclovir.  Script for Ayclovir 400mg  BID sent to [facility] in [place].  Caregiver inquired if patient was to restart the pill "a few weeks on and then a week off"; spoke with Dr. Randy Weiss and she confirmed yes patient is to restart Pomalyst .  Reviewed upcoming appointments; patient next to caregiver at time of call, no additional questions at this time.

## 2023-08-14 ENCOUNTER — Other Ambulatory Visit: Payer: Self-pay

## 2023-08-14 ENCOUNTER — Encounter: Payer: Self-pay | Admitting: Oncology

## 2023-08-14 NOTE — Progress Notes (Signed)
 Hematology/Oncology Consult note [facility]  [phone] [phone]  Patient Care Team: Monique Ano, MD as PCP - General (Family Medicine) [facility], [facility] (Optometry) Avonne Boettcher, MD as Consulting Physician (Oncology)   Name of the patient: Nicholas Weiss  [id number]  [birth year]   Date of visit: 08/14/23  Diagnosis- high risk IgG lambda multiple myeloma R-ISS stage III   Chief complaint/ Reason for visit-on treatment assessment prior to cycle 9 of Darzalex   Heme/Onc history: Patient is a 73 year old African-American male with a past medical history significant for uncontrolled type 1 diabetes, chronic kidney disease stage III chronic normocytic anemia referred for abnormal SPEP. Patient's creatinine has been fluctuating between 1.5-2 but about 5 months ago it went up all the way to 5 and his blood sugars were in the 1000 range. More recently his kidney numbers have been drifting back to normal values. As a part of the work-up he had serum protein electrophoresis done which showed an elevated gammaglobulin fraction with an M spike of 3%. The amount of M spike was not quantified in the specimen. He has therefore been referred to us  for further management. Patient endorses chronic fatigue reports that his appetite and weight have remained stable   Results of blood work from 04/08/2020 were as follows: CBC showed white count of 2.9, H&H of 10.5/31.2 with an MCV of 91 and a platelet count of 191.  CMP showed a mildly elevated creatinine of 1.2 which was better as compared to 5 months ago when it was 3.9.  Total protein was mildly elevated at 9.1 calcium  normal at 8.4 ferritin and iron  studies B12 folate TSH and haptoglobin were normal.  Myeloma panel revealed an elevated IgG level of 06/04/2004 with an M protein of 3.1 g.  Immunofixation showed IgG lambda specificity.  Serum free light chain ratio was elevated at 25 and free light chain  lambda elevated at 370   Further myeloma work-up including a PET CT scan did not reveal any evidence of edematous lesions.  Bone marrow biopsy showed 23% plasma cells by manual count and 30% by CD138 IHC.  Normal cytogenetics.  FISH studies for myeloma showed gain of 1 q.  13 q-. detected.  P53 not detected.   He was treated for a year and has having smoldering multiple myeloma since both CKD and anemia have been stable for 3 to 4 years and could be secondary to uncontrolled diabetes.In June 2023 IgG levels increased to 5463 with an M protein of 4.1 g as compared to 2.6 g in September 2022.  Serum free light chain ratio remains around 25.  Therefore a repeat bone marrow biopsy was done which shows further increase in plasma cells ranging from 30 to 70% and by immunohistochemistry 60 to 70% of the cells were positive for CD138.  Cytogenetics normal.  FISH study showed 4: 14 translocation and gain of 1 q. making this high risk RISS stage III.  Repeat PET scan showed no lytic lesions   Patient is not a transplant candidate and was started on Velcade  Revlimid  dexamethasone  regimen in July 2023.  He was also referred to [facility] for second opinion but did not go.  Treatment complicated by repeated hospitalizations for uncontrolled blood sugars.  He has been frequently hospitalized thereby causing multiple interruptions in his treatment. Due to lack of adequate response to Vrd darzalex  was added in may 2024. M protein remained around 2 g despite adding Darzalex  to the regimen  and therefore plan to switch to Pomalyst  plus Darzalex  and stop Velcade  and Revlimid  altogether starting August 2024  Interval history-patient was hospitalized end of April 2025For symptomatic orthostatic hypotension  ECOG PS- 2 Pain scale- 3 Opioid associated constipation- no  Review of systems- Review of Systems  Constitutional:  Positive for malaise/fatigue. Negative for chills, fever and weight loss.  HENT:  Negative for congestion,  ear discharge and nosebleeds.   Eyes:  Negative for blurred vision.  Respiratory:  Negative for cough, hemoptysis, sputum production, shortness of breath and wheezing.   Cardiovascular:  Negative for chest pain, palpitations, orthopnea and claudication.  Gastrointestinal:  Negative for abdominal pain, blood in stool, constipation, diarrhea, heartburn, melena, nausea and vomiting.  Genitourinary:  Negative for dysuria, flank pain, frequency, hematuria and urgency.  Musculoskeletal:  Negative for back pain, joint pain and myalgias.  Skin:  Negative for rash.  Neurological:  Negative for dizziness, tingling, focal weakness, seizures, weakness and headaches.  Endo/Heme/Allergies:  Does not bruise/bleed easily.  Psychiatric/Behavioral:  Negative for depression and suicidal ideas. The patient does not have insomnia.       Allergies  Allergen Reactions   Penicillins Other (See Comments)    Childhood allergy -tolerated amoxil  03/2022 Unknown reaction Has patient had a PCN reaction causing immediate rash, facial/tongue/throat swelling, SOB or lightheadedness with hypotension: No Has patient had a PCN reaction causing severe rash involving mucus membranes or skin necrosis: No Has patient had a PCN reaction that required hospitalization No Has patient had a PCN reaction occurring within the last 10 years: No If all of the above answers are "NO", then may proceed with Cephalosporin use.      Past Medical History:  Diagnosis Date   Acute metabolic encephalopathy 12/02/2020   DKA (diabetic ketoacidoses) 04/06/2016   Hypercholesteremia    Hypertension      Past Surgical History:  Procedure Laterality Date   AMPUTATION TOE Left 04/25/2022   Procedure: LEFT GREAT TOE AMPUTATION AND LEFT PARTIAL 2ND TOE AMPUTATION;  Surgeon: Dot Gazella, DPM;  Location: [facility] ORS;  Service: Podiatry;  Laterality: Left;   COLONOSCOPY WITH PROPOFOL  N/A 02/01/2020   Procedure: COLONOSCOPY WITH PROPOFOL ;  Surgeon:  Selena Daily, MD;  Location: [facility] ENDOSCOPY;  Service: Gastroenterology;  Laterality: N/A;   ESOPHAGOGASTRODUODENOSCOPY  02/01/2020   Procedure: ESOPHAGOGASTRODUODENOSCOPY (EGD);  Surgeon: Selena Daily, MD;  Location: [facility] ENDOSCOPY;  Service: Gastroenterology;;   ESOPHAGOGASTRODUODENOSCOPY (EGD) WITH PROPOFOL  N/A 06/07/2022   Procedure: ESOPHAGOGASTRODUODENOSCOPY (EGD) WITH PROPOFOL ;  Surgeon: Genell Ken, MD;  Location: [facility] ENDOSCOPY;  Service: Gastroenterology;  Laterality: N/A;   KNEE SURGERY Right    Torn meniscus   KNEE SURGERY Left     Social History   Socioeconomic History   Marital status: Single    Spouse name: Not on file   Number of children: 3   Years of education: Not on file   Highest education level: High school graduate  Occupational History    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  Tobacco Use   Smoking status: Never   Smokeless tobacco: Never  Vaping Use   Vaping status: Never Used  Substance and Sexual Activity   Alcohol use: Not Currently    Alcohol/week: 0.0 - 1.0 standard drinks of alcohol    Comment: "once every 2 months"   Drug use: Yes    Types: Marijuana, "Crack" cocaine    Comment: last week   Sexual activity: Not Currently    Birth control/protection: None  Other Topics Concern  Not on file  Social History Narrative   Lives with girlfriend "sharon"   Social Drivers of Health   Financial Resource Strain: Low Risk  (05/19/2023)   Received [facility]   Overall Financial Resource Strain (CARDIA)    Difficulty of Paying Living Expenses: Not hard at all  Food Insecurity: No Food Insecurity (07/22/2023)   Hunger Vital Sign    Worried About Running Out of Food in the Last Year: Never true    Ran Out of Food in the Last Year: Never true  Transportation Needs: No Transportation Needs (07/22/2023)   PRAPARE - Administrator, Civil Service (Medical): No    Lack of Transportation (Non-Medical): No  Physical  Activity: Insufficiently Active (01/01/2020)   Exercise Vital Sign    Days of Exercise per Week: 7 days    Minutes of Exercise per Session: 20 min  Stress: No Stress Concern Present (01/01/2020)   [employer] of Occupational Health - Occupational Stress Questionnaire    Feeling of Stress : Not at all  Social Connections: Patient Declined (07/22/2023)   Social Connection and Isolation Panel [NHANES]    Frequency of Communication with Friends and Family: Patient declined    Frequency of Social Gatherings with Friends and Family: Patient declined    Attends Religious Services: Patient declined    Database administrator or Organizations: Patient declined    Attends Banker Meetings: Patient declined    Marital Status: Patient declined  Intimate Partner Violence: Not At Risk (07/22/2023)   Humiliation, Afraid, Rape, and Kick questionnaire    Fear of Current or Ex-Partner: No    Emotionally Abused: No    Physically Abused: No    Sexually Abused: No    Family History  Problem Relation Age of Onset   Heart attack Father    Hypertension Sister    Cancer Sister      Current Outpatient Medications:    acyclovir  (ZOVIRAX ) 400 MG tablet, Take 1 tablet (400 mg total) by mouth 2 (two) times daily., Disp: 60 tablet, Rfl: 2   acetaminophen  (TYLENOL ) 500 MG tablet, Take 1,000 mg by mouth daily as needed for mild pain or moderate pain., Disp: , Rfl:    apixaban  (ELIQUIS ) 5 MG TABS tablet, Take 1 tablet (5 mg total) by mouth 2 (two) times daily. Start Eliquis  after 2 weeks which will be 03/02/2023, Disp: , Rfl:    ascorbic acid  (VITAMIN C ) 500 MG tablet, Take 1 tablet (500 mg total) by mouth daily., Disp: 90 tablet, Rfl: 0   atorvastatin  (LIPITOR ) 80 MG tablet, Take 1 tablet (80 mg total) by mouth at bedtime. Hold while taking Paxlovid , Disp: 30 tablet, Rfl: 2   bisacodyl  5 MG EC tablet, Take 1 tablet (5 mg total) by mouth at bedtime. Skip the dose if no constipation, Disp: , Rfl:     Continuous Glucose Sensor (DEXCOM G7 SENSOR) MISC, 1 each by Does not apply route 4 (four) times daily -  before meals and at bedtime., Disp: 1 each, Rfl: 0   fludrocortisone  (FLORINEF ) 0.1 MG tablet, Take 1 tablet (0.1 mg total) by mouth daily. Skip the dose if SBP >130, Disp: , Rfl:    gabapentin  (NEURONTIN ) 300 MG capsule, Take 300 mg by mouth 2 (two) times daily., Disp: , Rfl:    Insulin  Aspart FlexPen (NOVOLOG ) 100 UNIT/ML, Sliding Scale: Glucose 70 - 120: 0 units Glucose 121 - 150: 1 unit Glucose 151 - 200: 2  units Glucose 201 - 250: 3 units Glucose 251 - 300: 5 units Glucose 301 - 350: 7 units Glucose 351 - 400: 9 units Glucose > 400: call your docotr, Disp: , Rfl:    insulin  degludec (TRESIBA  FLEXTOUCH) 100 UNIT/ML FlexTouch Pen, Inject 5 Units into the skin daily. Home med., Disp: , Rfl:    insulin  lispro (HUMALOG) 100 UNIT/ML KwikPen, , Disp: , Rfl:    iron  polysaccharides (NIFEREX) 150 MG capsule, Take 1 capsule (150 mg total) by mouth daily., Disp: , Rfl:    levETIRAcetam  (KEPPRA ) 500 MG tablet, Take 1 tablet by mouth twice daily, Disp: 60 tablet, Rfl: 0   Multiple Vitamin (MULTIVITAMIN WITH MINERALS) TABS tablet, Take 1 tablet by mouth daily., Disp: 90 tablet, Rfl: 1   ondansetron  (ZOFRAN ) 8 MG tablet, Take 8 mg by mouth daily as needed for nausea or vomiting., Disp: , Rfl:    pantoprazole  (PROTONIX ) 40 MG tablet, Take 40 mg by mouth daily., Disp: , Rfl:    polyethylene glycol (MIRALAX ) 17 g packet, Take 17 g by mouth 2 (two) times daily. Skip the dose if no constipation, Disp: , Rfl:    pomalidomide  (POMALYST ) 2 MG capsule, Take 1 capsule (2 mg total) by mouth daily. Take for 21 days, the hold for 7 days. Repeat every 28 days., Disp: 21 capsule, Rfl: 0   traZODone  (DESYREL ) 50 MG tablet, Take 0.5 tablets (25 mg total) by mouth at bedtime as needed for sleep., Disp: , Rfl:  No current facility-administered medications for this visit.  Facility-Administered Medications Ordered in Other  Visits:    epoetin  alfa-epbx (RETACRIT ) injection 40,000 Units, 40,000 Units, Subcutaneous, Q21 days, Avonne Boettcher, MD, 40,000 Units at 07/09/23 1153  Physical exam:  Vitals:   08/13/23 0940 08/13/23 1002  BP:  128/81  Pulse:  77  Resp:  19  Temp:  98 F (36.7 C)  TempSrc:  Tympanic  SpO2:  100%  Weight:  150 lb (68 kg)  Height: 5' 10.5" (1.791 m) 5' 10.5" (1.791 m)   Physical Exam Cardiovascular:     Rate and Rhythm: Normal rate and regular rhythm.     Heart sounds: Normal heart sounds.  Pulmonary:     Effort: Pulmonary effort is normal.     Breath sounds: Normal breath sounds.  Abdominal:     General: Bowel sounds are normal.     Palpations: Abdomen is soft.  Skin:    General: Skin is warm and dry.  Neurological:     Mental Status: He is alert and oriented to person, place, and time.      I have personally reviewed labs listed below:    Latest Ref Rng & Units 08/13/2023    9:41 AM  CMP  Glucose 70 - 99 mg/dL 846   BUN 8 - 23 mg/dL 32   Creatinine 9.62 - 1.24 mg/dL 9.52   Sodium 841 - 324 mmol/L 135   Potassium 3.5 - 5.1 mmol/L 4.2   Chloride 98 - 111 mmol/L 103   CO2 22 - 32 mmol/L 24   Calcium  8.9 - 10.3 mg/dL 8.0   Total Protein 6.5 - 8.1 g/dL 7.6   Total Bilirubin 0.0 - 1.2 mg/dL 0.5   Alkaline Phos 38 - 126 U/L 58   AST 15 - 41 U/L 27   ALT 0 - 44 U/L 39       Latest Ref Rng & Units 08/13/2023    9:41 AM  CBC  WBC 4.0 -  10.5 K/uL 2.8   Hemoglobin 13.0 - 17.0 g/dL 7.5   Hematocrit 40.9 - 52.0 % 23.4   Platelets 150 - 400 K/uL 189    I have personally reviewed Radiology images listed below: No images are attached to the encounter.  CT Cervical Spine Wo Contrast Result Date: 07/21/2023 CLINICAL DATA:  Neck trauma.  Fall. EXAM: CT CERVICAL SPINE WITHOUT CONTRAST TECHNIQUE: Multidetector CT imaging of the cervical spine was performed without intravenous contrast. Multiplanar CT image reconstructions were also generated. RADIATION DOSE REDUCTION: This  exam was performed according to the departmental dose-optimization program which includes automated exposure control, adjustment of the mA and/or kV according to patient size and/or use of iterative reconstruction technique. COMPARISON:  CT of the cervical spine 05/18/2023. FINDINGS: Alignment: Slight degenerative retrolisthesis is again noted at C5-6 and C6-7. No other significant listhesis is present. Straightening of the normal cervical lordosis is present. Skull base and vertebrae: The craniocervical junction is normal. The vertebral body heights are normal. No acute fracture is present. Soft tissues and spinal canal: No prevertebral fluid or swelling. No visible canal hematoma. Atherosclerotic calcifications are present at the carotid bifurcations bilaterally with left greater than right proximal ICA stenosis. Disc levels: Facet hypertrophy and uncovertebral spurring result in moderate left foraminal stenosis at C3-4 and C4-5. Moderate foraminal narrowing is present bilaterally, left greater than right at C5-6 and C6-7. No significant interval change is present. Upper chest: The lung apices are clear. The thoracic inlet is within normal limits. IMPRESSION: 1. No acute fracture or traumatic subluxation. 2. Multilevel degenerative changes of the cervical spine as described. 3. Left greater than right proximal ICA stenosis. Electronically Signed   By: Audree Leas M.D.   On: 07/21/2023 17:14   CT Head Wo Contrast Result Date: 07/21/2023 CLINICAL DATA:  Fall. Patient is on blood thinners. Trauma to head. No loss of consciousness. EXAM: CT HEAD WITHOUT CONTRAST TECHNIQUE: Contiguous axial images were obtained from the base of the skull through the vertex without intravenous contrast. RADIATION DOSE REDUCTION: This exam was performed according to the departmental dose-optimization program which includes automated exposure control, adjustment of the mA and/or kV according to patient size and/or use of  iterative reconstruction technique. COMPARISON:  CT head without contrast 05/18/2023. FINDINGS: Brain: Extensive calcifications are again noted along the inter cerebral falx and tentorium. No acute infarct, hemorrhage, or mass lesion is present. Periventricular and subcortical white matter changes are mildly advanced for age, similar the prior study. Deep brain nuclei are within normal limits. The brainstem and cerebellum are within normal limits. Midline structures are within normal limits. Vascular: Atherosclerotic calcifications are present within the cavernous internal carotid arteries bilaterally. No hyperdense vessel is present. Skull: Calvarium is intact. No focal lytic or blastic lesions are present. No significant extracranial soft tissue lesion is present. Sinuses/Orbits: The paranasal sinuses and mastoid air cells are clear. The globes and orbits are within normal limits. IMPRESSION: 1. No acute intracranial abnormality or significant interval change. 2. Periventricular and subcortical white matter changes are mildly advanced for age, similar the prior study. This likely reflects the sequela of chronic microvascular ischemia. Electronically Signed   By: Audree Leas M.D.   On: 07/21/2023 17:11     Assessment and plan- Patient is a 73 y.o. male  with history of IgG lambda multiple myeloma R-ISS stage III .  He is here for on treatment assessment prior to cycle 9 of Darzalex .  Counts okay to proceed with cycle 9 of Darzalex   today.  He will continue to take Pomalyst  2 mg 3 weeks on 1 week off starting today.  I will see him back in 4 weeks for cycle 10.  Continue with aspirin  and acyclovir  prophylaxis.  Overall there has been no decrease in his size of M protein as well as serum free light chains despite switching him from Revlimid  to Pomalyst .  Myeloma panel and serum free light chains from today are pending.  I will consider switching him from Darzalex  to carfilzomib if levels do not trend  down.  His care has been complicated by frequent hospitalizations and frequent treatment interruptions.  I am concerned about doing weekly carfilzomib for him given his history of hypotension and brittle diabetes and weekly regimen involved.  His last echocardiogram showed a normal EF.  Hemoglobin today is low at 7.5 after his recent hospitalization.  Typically hemoglobin runs between 9-10.  I will check iron  studies B12 and folate with the next set of labs.  He does have baseline chronic kidney disease and he will be receiving EPO injections today   Visit Diagnosis 1. Encounter for antineoplastic chemotherapy   2. Multiple myeloma not having achieved remission (HCC)   3. High risk medication use   4. Erythropoietin  (EPO) stimulating agent anemia management patient      Dr. Seretha Dance, MD, MPH [facility] at [facility] [phone] 08/14/2023 12:29 PM

## 2023-08-15 ENCOUNTER — Other Ambulatory Visit: Payer: Self-pay

## 2023-08-15 ENCOUNTER — Inpatient Hospital Stay

## 2023-08-15 ENCOUNTER — Encounter: Payer: Self-pay | Admitting: Emergency Medicine

## 2023-08-15 ENCOUNTER — Inpatient Hospital Stay
Admission: EM | Admit: 2023-08-15 | Discharge: 2023-08-19 | DRG: 637 | Disposition: A | Discharge status: Home / Self Care | Attending: Internal Medicine | Admitting: Internal Medicine

## 2023-08-15 DIAGNOSIS — N179 Acute kidney failure, unspecified: Secondary | ICD-10-CM | POA: Diagnosis present

## 2023-08-15 DIAGNOSIS — C9000 Multiple myeloma not having achieved remission: Secondary | ICD-10-CM | POA: Diagnosis present

## 2023-08-15 DIAGNOSIS — E1040 Type 1 diabetes mellitus with diabetic neuropathy, unspecified: Secondary | ICD-10-CM | POA: Diagnosis present

## 2023-08-15 DIAGNOSIS — I951 Orthostatic hypotension: Secondary | ICD-10-CM | POA: Diagnosis present

## 2023-08-15 DIAGNOSIS — I4891 Unspecified atrial fibrillation: Secondary | ICD-10-CM | POA: Diagnosis present

## 2023-08-15 DIAGNOSIS — Z794 Long term (current) use of insulin: Secondary | ICD-10-CM | POA: Diagnosis not present

## 2023-08-15 DIAGNOSIS — E440 Moderate protein-calorie malnutrition: Secondary | ICD-10-CM | POA: Diagnosis present

## 2023-08-15 DIAGNOSIS — I21A1 Myocardial infarction type 2: Secondary | ICD-10-CM | POA: Diagnosis present

## 2023-08-15 DIAGNOSIS — R54 Age-related physical debility: Secondary | ICD-10-CM | POA: Diagnosis present

## 2023-08-15 DIAGNOSIS — Z8249 Family history of ischemic heart disease and other diseases of the circulatory system: Secondary | ICD-10-CM

## 2023-08-15 DIAGNOSIS — G40909 Epilepsy, unspecified, not intractable, without status epilepticus: Secondary | ICD-10-CM | POA: Diagnosis present

## 2023-08-15 DIAGNOSIS — R4587 Impulsiveness: Secondary | ICD-10-CM | POA: Diagnosis present

## 2023-08-15 DIAGNOSIS — R64 Cachexia: Secondary | ICD-10-CM | POA: Diagnosis present

## 2023-08-15 DIAGNOSIS — G3184 Mild cognitive impairment, so stated: Secondary | ICD-10-CM | POA: Diagnosis present

## 2023-08-15 DIAGNOSIS — I10 Essential (primary) hypertension: Secondary | ICD-10-CM | POA: Diagnosis present

## 2023-08-15 DIAGNOSIS — N1831 Chronic kidney disease, stage 3a: Secondary | ICD-10-CM | POA: Diagnosis present

## 2023-08-15 DIAGNOSIS — Z6820 Body mass index (BMI) 20.0-20.9, adult: Secondary | ICD-10-CM

## 2023-08-15 DIAGNOSIS — E7800 Pure hypercholesterolemia, unspecified: Secondary | ICD-10-CM | POA: Diagnosis present

## 2023-08-15 DIAGNOSIS — I129 Hypertensive chronic kidney disease with stage 1 through stage 4 chronic kidney disease, or unspecified chronic kidney disease: Secondary | ICD-10-CM | POA: Diagnosis present

## 2023-08-15 DIAGNOSIS — D631 Anemia in chronic kidney disease: Secondary | ICD-10-CM | POA: Diagnosis present

## 2023-08-15 DIAGNOSIS — E1010 Type 1 diabetes mellitus with ketoacidosis without coma: Principal | ICD-10-CM | POA: Diagnosis present

## 2023-08-15 DIAGNOSIS — E860 Dehydration: Secondary | ICD-10-CM | POA: Diagnosis present

## 2023-08-15 DIAGNOSIS — E1022 Type 1 diabetes mellitus with diabetic chronic kidney disease: Secondary | ICD-10-CM | POA: Diagnosis present

## 2023-08-15 DIAGNOSIS — E10649 Type 1 diabetes mellitus with hypoglycemia without coma: Secondary | ICD-10-CM | POA: Diagnosis present

## 2023-08-15 DIAGNOSIS — R748 Abnormal levels of other serum enzymes: Secondary | ICD-10-CM | POA: Diagnosis not present

## 2023-08-15 DIAGNOSIS — E8729 Other acidosis: Secondary | ICD-10-CM | POA: Insufficient documentation

## 2023-08-15 DIAGNOSIS — Z880 Allergy status to penicillin: Secondary | ICD-10-CM

## 2023-08-15 DIAGNOSIS — E43 Unspecified severe protein-calorie malnutrition: Secondary | ICD-10-CM | POA: Diagnosis present

## 2023-08-15 DIAGNOSIS — D630 Anemia in neoplastic disease: Secondary | ICD-10-CM | POA: Diagnosis present

## 2023-08-15 DIAGNOSIS — E1110 Type 2 diabetes mellitus with ketoacidosis without coma: Secondary | ICD-10-CM | POA: Diagnosis present

## 2023-08-15 DIAGNOSIS — Z9641 Presence of insulin pump (external) (internal): Secondary | ICD-10-CM | POA: Diagnosis present

## 2023-08-15 DIAGNOSIS — E1310 Other specified diabetes mellitus with ketoacidosis without coma: Principal | ICD-10-CM

## 2023-08-15 DIAGNOSIS — Z89422 Acquired absence of other left toe(s): Secondary | ICD-10-CM

## 2023-08-15 DIAGNOSIS — I2489 Other forms of acute ischemic heart disease: Secondary | ICD-10-CM | POA: Diagnosis present

## 2023-08-15 DIAGNOSIS — Z7901 Long term (current) use of anticoagulants: Secondary | ICD-10-CM

## 2023-08-15 DIAGNOSIS — R296 Repeated falls: Secondary | ICD-10-CM | POA: Diagnosis present

## 2023-08-15 DIAGNOSIS — E785 Hyperlipidemia, unspecified: Secondary | ICD-10-CM | POA: Diagnosis present

## 2023-08-15 DIAGNOSIS — I4892 Unspecified atrial flutter: Secondary | ICD-10-CM | POA: Diagnosis present

## 2023-08-15 DIAGNOSIS — E1069 Type 1 diabetes mellitus with other specified complication: Secondary | ICD-10-CM | POA: Diagnosis not present

## 2023-08-15 DIAGNOSIS — Z79899 Other long term (current) drug therapy: Secondary | ICD-10-CM

## 2023-08-15 DIAGNOSIS — R7989 Other specified abnormal findings of blood chemistry: Secondary | ICD-10-CM | POA: Diagnosis present

## 2023-08-15 DIAGNOSIS — Z7952 Long term (current) use of systemic steroids: Secondary | ICD-10-CM

## 2023-08-15 DIAGNOSIS — E875 Hyperkalemia: Secondary | ICD-10-CM | POA: Diagnosis present

## 2023-08-15 LAB — BASIC METABOLIC PANEL WITH GFR
Anion gap: 25 — ABNORMAL HIGH (ref 5–15)
Anion gap: 19 — ABNORMAL HIGH (ref 5–15)
BUN: 74 mg/dL — ABNORMAL HIGH (ref 8–23)
BUN: 71 mg/dL — ABNORMAL HIGH (ref 8–23)
CO2: 9 mmol/L — ABNORMAL LOW (ref 22–32)
CO2: 14 mmol/L — ABNORMAL LOW (ref 22–32)
Calcium: 8.8 mg/dL — ABNORMAL LOW (ref 8.9–10.3)
Calcium: 8.3 mg/dL — ABNORMAL LOW (ref 8.9–10.3)
Chloride: 104 mmol/L (ref 98–111)
Chloride: 111 mmol/L (ref 98–111)
Creatinine, Ser: 2.72 mg/dL — ABNORMAL HIGH (ref 0.61–1.24)
Creatinine, Ser: 2.63 mg/dL — ABNORMAL HIGH (ref 0.61–1.24)
GFR, Estimated: 24 mL/min — ABNORMAL LOW (ref >60)
GFR, Estimated: 25 mL/min — ABNORMAL LOW (ref >60)
Glucose, Bld: 814 mg/dL (ref 70–99)
Glucose, Bld: 663 mg/dL (ref 70–99)
Potassium: 5.9 mmol/L — ABNORMAL HIGH (ref 3.5–5.1)
Potassium: 4.4 mmol/L (ref 3.5–5.1)
Sodium: 138 mmol/L (ref 135–145)
Sodium: 144 mmol/L (ref 135–145)

## 2023-08-15 LAB — TROPONIN I (HIGH SENSITIVITY)
Troponin I (High Sensitivity): 161 ng/L (ref <18)
Troponin I (High Sensitivity): 170 ng/L (ref <18)
Troponin I (High Sensitivity): 162 ng/L (ref <18)

## 2023-08-15 LAB — CBC
HCT: 37.3 % — ABNORMAL LOW (ref 39.0–52.0)
Hemoglobin: 10.3 g/dL — ABNORMAL LOW (ref 13.0–17.0)
MCH: 31 pg (ref 26.0–34.0)
MCHC: 27.6 g/dL — ABNORMAL LOW (ref 30.0–36.0)
MCV: 112.3 fL — ABNORMAL HIGH (ref 80.0–100.0)
Platelets: 230 10*3/uL (ref 150–400)
RBC: 3.32 MIL/uL — ABNORMAL LOW (ref 4.22–5.81)
RDW: 17 % — ABNORMAL HIGH (ref 11.5–15.5)
WBC: 4.5 10*3/uL (ref 4.0–10.5)
nRBC: 2.2 % — ABNORMAL HIGH (ref 0.0–0.2)

## 2023-08-15 LAB — BLOOD GAS, VENOUS
Acid-base deficit: 17.2 mmol/L — ABNORMAL HIGH (ref 0.0–2.0)
Bicarbonate: 10.6 mmol/L — ABNORMAL LOW (ref 20.0–28.0)
O2 Saturation: 52.6 %
Patient temperature: 37
pCO2, Ven: 31 mmHg — ABNORMAL LOW (ref 44–60)
pH, Ven: 7.14 — CL (ref 7.25–7.43)
pO2, Ven: 40 mmHg (ref 32–45)

## 2023-08-15 LAB — GLUCOSE, CAPILLARY
Glucose-Capillary: >600 mg/dL (ref 70–99)
Glucose-Capillary: >600 mg/dL (ref 70–99)
Glucose-Capillary: 561 mg/dL (ref 70–99)
Glucose-Capillary: 562 mg/dL (ref 70–99)
Glucose-Capillary: 497 mg/dL — ABNORMAL HIGH (ref 70–99)
Glucose-Capillary: 538 mg/dL (ref 70–99)
Glucose-Capillary: 374 mg/dL — ABNORMAL HIGH (ref 70–99)
Glucose-Capillary: 469 mg/dL — ABNORMAL HIGH (ref 70–99)

## 2023-08-15 LAB — URINALYSIS, ROUTINE W REFLEX MICROSCOPIC
Bacteria, UA: NONE SEEN
Bilirubin Urine: NEGATIVE
Glucose, UA: >=500 mg/dL — AB
Hgb urine dipstick: NEGATIVE
Ketones, ur: 20 mg/dL — AB
Leukocytes,Ua: NEGATIVE
Nitrite: NEGATIVE
Protein, ur: NEGATIVE mg/dL
RBC / HPF: 0 RBC/hpf (ref 0–5)
Specific Gravity, Urine: 1.021 (ref 1.005–1.030)
pH: 5 (ref 5.0–8.0)

## 2023-08-15 LAB — HEPARIN LEVEL (UNFRACTIONATED): Heparin Unfractionated: >1.1 [IU]/mL — ABNORMAL HIGH (ref 0.30–0.70)

## 2023-08-15 LAB — APTT: aPTT: 29 s (ref 24–36)

## 2023-08-15 LAB — MRSA NEXT GEN BY PCR, NASAL: MRSA by PCR Next Gen: NOT DETECTED

## 2023-08-15 LAB — CBG MONITORING, ED
Glucose-Capillary: >600 mg/dL (ref 70–99)
Glucose-Capillary: >600 mg/dL (ref 70–99)
Glucose-Capillary: >600 mg/dL (ref 70–99)
Glucose-Capillary: >600 mg/dL (ref 70–99)

## 2023-08-15 LAB — MAGNESIUM: Magnesium: 2.3 mg/dL (ref 1.7–2.4)

## 2023-08-15 LAB — PROTIME-INR
INR: 1.4 — ABNORMAL HIGH (ref 0.8–1.2)
Prothrombin Time: 17.6 s — ABNORMAL HIGH (ref 11.4–15.2)

## 2023-08-15 LAB — BETA-HYDROXYBUTYRIC ACID: Beta-Hydroxybutyric Acid: >8 mmol/L — ABNORMAL HIGH (ref 0.05–0.27)

## 2023-08-15 LAB — PHOSPHORUS: Phosphorus: 5.8 mg/dL — ABNORMAL HIGH (ref 2.5–4.6)

## 2023-08-15 MED ORDER — CHLORHEXIDINE GLUCONATE CLOTH 2 % EX PADS
6.0000 | MEDICATED_PAD | Freq: Every day | CUTANEOUS | Status: DC
  Administered 2023-08-16: 6 via TOPICAL

## 2023-08-15 MED ORDER — LACTATED RINGERS IV SOLN: INTRAVENOUS | Status: AC

## 2023-08-15 MED ORDER — LEVETIRACETAM 500 MG PO TABS
500.0000 mg | ORAL_TABLET | Freq: Two times a day (BID) | ORAL | Status: DC
  Administered 2023-08-15 – 2023-08-19 (×8): 500 mg via ORAL
  Filled 2023-08-15 (×8): qty 1

## 2023-08-15 MED ORDER — LACTATED RINGERS IV BOLUS
20.0000 mL/kg | Freq: Once | INTRAVENOUS | Status: AC
  Administered 2023-08-15: 1360 mL via INTRAVENOUS

## 2023-08-15 MED ORDER — HYDRALAZINE HCL 20 MG/ML IJ SOLN
5.0000 mg | Freq: Four times a day (QID) | INTRAMUSCULAR | Status: DC | PRN
  Administered 2023-08-17: 5 mg via INTRAVENOUS
  Filled 2023-08-15: qty 1

## 2023-08-15 MED ORDER — GABAPENTIN 300 MG PO CAPS
300.0000 mg | ORAL_CAPSULE | Freq: Two times a day (BID) | ORAL | Status: DC
  Administered 2023-08-16 – 2023-08-19 (×7): 300 mg via ORAL
  Filled 2023-08-15 (×8): qty 1

## 2023-08-15 MED ORDER — DEXTROSE 50 % IV SOLN: 0.0000 mL | INTRAVENOUS | Status: DC | PRN

## 2023-08-15 MED ORDER — SODIUM CHLORIDE 0.9 % IV BOLUS
1000.0000 mL | Freq: Once | INTRAVENOUS | Status: AC
  Administered 2023-08-15: 1000 mL via INTRAVENOUS

## 2023-08-15 MED ORDER — DEXTROSE IN LACTATED RINGERS 5 % IV SOLN: INTRAVENOUS | Status: AC

## 2023-08-15 MED ORDER — APIXABAN 5 MG PO TABS: 5.0000 mg | ORAL_TABLET | Freq: Two times a day (BID) | ORAL | Status: DC

## 2023-08-15 MED ORDER — ACYCLOVIR 200 MG PO CAPS
400.0000 mg | ORAL_CAPSULE | Freq: Two times a day (BID) | ORAL | Status: DC
  Administered 2023-08-16 – 2023-08-19 (×7): 400 mg via ORAL
  Filled 2023-08-15 (×9): qty 2

## 2023-08-15 MED ORDER — SODIUM BICARBONATE 8.4 % IV SOLN
50.0000 meq | Freq: Once | INTRAVENOUS | Status: AC
  Administered 2023-08-15: 50 meq via INTRAVENOUS
  Filled 2023-08-15: qty 50

## 2023-08-15 MED ORDER — INSULIN REGULAR(HUMAN) IN NACL 100-0.9 UT/100ML-% IV SOLN
INTRAVENOUS | Status: DC
  Administered 2023-08-15: 6 [IU]/h via INTRAVENOUS
  Filled 2023-08-15: qty 100

## 2023-08-15 MED ORDER — HEPARIN (PORCINE) 25000 UT/250ML-% IV SOLN
650.0000 [IU]/h | INTRAVENOUS | Status: DC
  Administered 2023-08-15: 750 [IU]/h via INTRAVENOUS
  Filled 2023-08-15: qty 250

## 2023-08-15 MED ORDER — TRAZODONE HCL 50 MG PO TABS
25.0000 mg | ORAL_TABLET | Freq: Every evening | ORAL | Status: DC | PRN
Start: 2023-08-15 — End: 2023-08-19

## 2023-08-15 NOTE — Assessment & Plan Note (Signed)
 Will hold home pomalidomide  on admission, discussed with medical oncology

## 2023-08-15 NOTE — ED Triage Notes (Signed)
 Pt arrives via EMS from home with reports of hyperglycemia since last night. Family took pump off last night and attempted to get CBG down by insulin  shots with no luck. Per EMS, pt alert to normal. CBG reading High for EMS and on arrival.

## 2023-08-15 NOTE — Assessment & Plan Note (Signed)
 Secondary to DKA Continue aggressive fluid hydration and insulin  per Endo tool Recheck BMP every 4 hours

## 2023-08-15 NOTE — Assessment & Plan Note (Signed)
 Continue Endo tool and aggressive fluid hydration

## 2023-08-15 NOTE — Assessment & Plan Note (Signed)
 Hydralazine  5 mg every 6 hours as needed for SBP greater 170, 5 days ordered

## 2023-08-15 NOTE — Progress Notes (Signed)
 PHARMACY - ANTICOAGULATION CONSULT NOTE  Pharmacy Consult for Heparin  Infusion Indication: chest pain/ACS  Allergies  Allergen Reactions   Penicillins Other (See Comments)    Childhood allergy -tolerated amoxil  03/2022 Unknown reaction Has patient had a PCN reaction causing immediate rash, facial/tongue/throat swelling, SOB or lightheadedness with hypotension: No Has patient had a PCN reaction causing severe rash involving mucus membranes or skin necrosis: No Has patient had a PCN reaction that required hospitalization No Has patient had a PCN reaction occurring within the last 10 years: No If all of the above answers are "NO", then may proceed with Cephalosporin use.     Patient Measurements: Height: 5\' 10"  (177.8 cm) Weight: 63.7 kg (140 lb 6.9 oz) IBW/kg (Calculated) : 73 HEPARIN  DW (KG): 63.7  Vital Signs: Temp: 98.3 F (36.8 C) (05/18 2000) Temp Source: Oral (05/18 2000) BP: 116/102 (05/18 2000) Pulse Rate: 90 (05/18 2000)  Labs: Recent Labs    08/13/23 0941 08/15/23 1524 08/15/23 1822 08/15/23 1948  HGB 7.5* 10.3*  --   --   HCT 23.4* 37.3*  --   --   PLT 189 230  --   --   CREATININE 1.45* 2.72*  --  2.63*  TROPONINIHS  --   --  161* 170*    Estimated Creatinine Clearance: 23.5 mL/min (A) (by C-G formula based on SCr of 2.63 mg/dL (H)).   Medical History: Past Medical History:  Diagnosis Date   Acute metabolic encephalopathy 12/02/2020   DKA (diabetic ketoacidoses) 04/06/2016   Hypercholesteremia    Hypertension     Medications:  On apixaban  5 mg bid at home  Assessment: Patient is a 73 year old male who presented with hyperglycemia. Upon evaluation, patient noted to have elevated troponin of 161>170. Pharmacy consulted to initiate patient on heparin  infusion.   Baseline Labs: Hgb 10.3 PLT 230 INR ordered aPTT ordered Heparin  level ordered  Goal of Therapy:  Heparin  level 0.3-0.7 units/ml aPTT 66-102 seconds Monitor platelets by  anticoagulation protocol: Yes   Plan:  Will not bolus given recent use of apixaban  Start heparin  infusion at a rate of 750 units/hr Check aPTT level in 8 hours and heparin  level daily Plan is to monitor via aPTT levels until heparin  level correlates Monitor CBC daily while on heparin   Alice Innocent, PharmD, BCPS 08/15/2023,9:04 PM

## 2023-08-15 NOTE — ED Provider Notes (Signed)
 [facility] Provider Note    Event Date/Time   First MD Initiated Contact with Patient 08/15/23 1504     (approximate)   History   Hyperglycemia   HPI  Nicholas Weiss is a 73 y.o. male  who presents to the emergency department today because of concern for elevated blood sugar.  The patient has self is unable to give any significant history.  Per report the patient had been noted to have high blood sugar overnight and they were trying to treat it with insulin  shots rather than the patient's insulin  pump. Patient denies any pain at the time of my examination. Cannot say how long his blood sugar has been elevated.     Physical Exam   Triage Vital Signs: ED Triage Vitals  Encounter Vitals Group     BP 08/15/23 1509 133/77     Systolic BP Percentile --      Diastolic BP Percentile --      Pulse Rate 08/15/23 1503 92     Resp 08/15/23 1503 15     Temp 08/15/23 1503 98.3 F (36.8 C)     Temp Source 08/15/23 1503 Oral     SpO2 08/15/23 1503 98 %     Weight --      Height --      Head Circumference --      Peak Flow --      Pain Score 08/15/23 1507 0     Pain Loc --      Pain Education --      Exclude from Growth Chart --     Most recent vital signs: Vitals:   08/15/23 1503 08/15/23 1509  BP:  133/77  Pulse: 92 91  Resp: 15 (!) 25  Temp: 98.3 F (36.8 C)   SpO2: 98% 100%   General: Somnolent, awakens to verbal stimuli, not completely oriented. CV:  Good peripheral perfusion. Regular rate and rhythm. Resp:  Normal effort. Lungs clear. Abd:  No distention. Non tender.   ED Results / Procedures / Treatments   Labs (all labs ordered are listed, but only abnormal results are displayed) Labs Reviewed  BASIC METABOLIC PANEL WITH GFR - Abnormal; Notable for the following components:      Result Value   Potassium 5.9 (*)    CO2 9 (*)    Glucose, Bld 814 (*)    BUN 74 (*)    Creatinine, Ser 2.72 (*)    Calcium  8.8 (*)    GFR, Estimated 24  (*)    Anion gap 25 (*)    All other components within normal limits  CBC - Abnormal; Notable for the following components:   RBC 3.32 (*)    Hemoglobin 10.3 (*)    HCT 37.3 (*)    MCV 112.3 (*)    MCHC 27.6 (*)    RDW 17.0 (*)    nRBC 2.2 (*)    All other components within normal limits  CBG MONITORING, ED - Abnormal; Notable for the following components:   Glucose-Capillary >600 (*)    All other components within normal limits  URINALYSIS, ROUTINE W REFLEX MICROSCOPIC  BLOOD GAS, VENOUS  BETA-HYDROXYBUTYRIC ACID  CBG MONITORING, ED     EKG  None   RADIOLOGY None  PROCEDURES:  Critical Care performed: Yes  CRITICAL CARE Performed by: Marylynn Soho   Total critical care time: 30 minutes  Critical care time was exclusive of separately billable procedures and treating other patients.  Critical care was necessary to treat or prevent imminent or life-threatening deterioration.  Critical care was time spent personally by me on the following activities: development of treatment plan with patient and/or surrogate as well as nursing, discussions with consultants, evaluation of patient's response to treatment, examination of patient, obtaining history from patient or surrogate, ordering and performing treatments and interventions, ordering and review of laboratory studies, ordering and review of radiographic studies, pulse oximetry and re-evaluation of patient's condition.   Procedures    MEDICATIONS ORDERED IN ED: Medications - No data to display   IMPRESSION / MDM / ASSESSMENT AND PLAN / ED COURSE  I reviewed the triage vital signs and the nursing notes.                              Differential diagnosis includes, but is not limited to, hyperglycemia, DKA, electrolyte abnormality  Patient's presentation is most consistent with acute presentation with potential threat to life or bodily function.   The patient is on the cardiac monitor to evaluate for  evidence of arrhythmia and/or significant heart rate changes.  Patient presented to the emergency department today because of concerns for high blood sugar.  Patient is somnolent on exam and cannot give great history.  Blood work is concerning for possible DKA with elevated blood sugar and anion gap.  Additionally bicarb is extremely low. Will plan on starting IVFs and insulin . While awaiting ketones and VBG.  VBG consistent with DKA. Discussed with Dr. Reinhold Carbine with the hospitalist service who will evaluate for admission.     FINAL CLINICAL IMPRESSION(S) / ED DIAGNOSES   Final diagnoses:  Diabetic ketoacidosis without coma associated with other specified diabetes mellitus (HCC)        Rx / DC Orders       Note:  This document was prepared using Dragon voice recognition software and may include unintentional dictation errors.    Marylynn Soho, MD 08/15/23 [id number]

## 2023-08-15 NOTE — Assessment & Plan Note (Signed)
 Check phosphorus and magnesium  level on admission

## 2023-08-15 NOTE — Hospital Course (Addendum)
 72yo with h/o T1DM on insulin  pump with neuropathy, GERD, HLD, aflutter on Eliquis , and multiple myeloma who presented on 5/18 with hyperglycemia and was found to be in DKA.  He was started on Endotool and DKA has resolved.  His significant other has concerns about his stability for home and prefers SNF rehab first if possible.

## 2023-08-15 NOTE — ED Notes (Signed)
 Pt pulled IV out and pulled all monitor cords off.

## 2023-08-15 NOTE — Assessment & Plan Note (Addendum)
 Treatment per above and bicarb 50 mill equivalents IV one-time dose ordered

## 2023-08-15 NOTE — Assessment & Plan Note (Addendum)
 Suspect secondary to demand ischemia in setting of DKA and acute kidney injury with dehydration Patient denies chest pain and shortness of breath However, in setting of DKA, NSTEMI can not be excluded at this time We will continue to monitor second high since troponin If there is a positive delta, would recommend cross coverage provider consider heparin  GTT per pharmacy

## 2023-08-15 NOTE — Assessment & Plan Note (Signed)
 Continue insulin  per Endo tool Aggressive fluid hydration per DKA order set N.p.o. except for noncaloric beverages Check phosphorus, serum magnesium  level, high-sensitivity troponin, EKG on admission Admit to stepdown, inpatient

## 2023-08-15 NOTE — Plan of Care (Signed)
  Problem: Fluid Volume: Goal: Ability to achieve a balanced intake and output will improve Outcome: Progressing   Problem: Metabolic: Goal: Ability to maintain appropriate glucose levels will improve Outcome: Progressing   Problem: Respiratory: Goal: Will regain and/or maintain adequate ventilation Outcome: Progressing   Problem: Coping: Goal: Level of anxiety will decrease Outcome: Progressing   Problem: Elimination: Goal: Will not experience complications related to bowel motility Outcome: Progressing Goal: Will not experience complications related to urinary retention Outcome: Progressing   Problem: Pain Managment: Goal: General experience of comfort will improve and/or be controlled Outcome: Progressing   Problem: Safety: Goal: Ability to remain free from injury will improve Outcome: Progressing   Problem: Skin Integrity: Goal: Risk for impaired skin integrity will decrease Outcome: Progressing

## 2023-08-15 NOTE — H&P (Addendum)
 History and Physical   Nicholas Weiss XBM:[id number] DOB: [birth year] DOA: 08/15/2023  PCP: Monique Ano, MD  Patient coming from: Home via EMS  I have personally briefly reviewed patient's old medical records in [facility] EMR.  Chief Concern: Hyperglycemia  HPI: [name] is a 9m with history of neuropathy, GERD, hyperlipidemia, insulin -dependent diabetes mellitus controlled and on insulin  pump, atrial flutter on apixaban , multiple myeloma, who presents emergency department for chief concerns of hyperglycemia.  Vitals in the ED showed t 98.3, rr 14, hr 94, blood pressure 139/85, SpO2 100% on room air.  Serum sodium is 138, potassium 5.9, hemoglobin 1.4, bicarb 9, BUN of 74, serum creatinine of 2.72, EGFR 24.  Anion gap is elevated 25.  VBG 7.14/31/40.  ED treatment: Insulin  via Endo tool, LR 1360 mL bolus, LR infusion at 125 mL/h, sodium chloride  1 L bolus. ------------------------------------ At bedside, patient is able to tell me his first and last name, age, location, current calendar year.  He reports that his insulin  pump was not working for the last couple of days.  He denies being in any pain including chest pain and abdominal pain, nausea, vomiting, diarrhea, dysuria, hematuria, syncope.  Social history: He lives at home with his girlfriend Nicholas Weiss.  He denies tobacco, EtOH, recreational drug use.  He is disabled.  ROS: Constitutional: no weight change, no fever ENT/Mouth: no sore throat, no rhinorrhea Eyes: no eye pain, no vision changes Cardiovascular: no chest pain, no dyspnea,  no edema, no palpitations Respiratory: no cough, no sputum, no wheezing Gastrointestinal: no nausea, no vomiting, no diarrhea, no constipation Genitourinary: no urinary incontinence, no dysuria, no hematuria Musculoskeletal: no arthralgias, no myalgias Skin: no skin lesions, no pruritus, Neuro: + weakness, no loss of consciousness, no syncope Psych: no anxiety, no  depression, + decrease appetite Heme/Lymph: no bruising, no bleeding  ED Course: Discussed with EDP, patient requiring hospitalization for chief concerns of DKA.  Assessment/Plan  Principal Problem:   DKA (diabetic ketoacidosis) (HCC) Active Problems:   AKI (acute kidney injury) (HCC)   Elevated troponin   Uncontrolled type 1 diabetes mellitus with hypoglycemia, with long-term current use of insulin  (HCC)   Hyperkalemia   Protein-calorie malnutrition, severe   Hypertension   Dyslipidemia   Multiple myeloma not having achieved remission (HCC)   Hypophosphatemia   High anion gap metabolic acidosis   Assessment and Plan:  * DKA (diabetic ketoacidosis) (HCC) Continue insulin  per Endo tool Aggressive fluid hydration per DKA order set N.p.o. except for noncaloric beverages Check phosphorus, serum magnesium  level, high-sensitivity troponin, EKG on admission Admit to stepdown, inpatient  Elevated troponin Suspect secondary to demand ischemia in setting of DKA and acute kidney injury with dehydration Patient denies chest pain and shortness of breath However, in setting of DKA, NSTEMI can not be excluded at this time We will continue to monitor second high since troponin If there is a positive delta, would recommend cross coverage provider consider heparin  GTT per pharmacy  AKI (acute kidney injury) (HCC) Secondary to DKA Continue aggressive fluid hydration and insulin  per Endo tool Recheck BMP every 4 hours  Hypertension Hydralazine  5 mg every 6 hours as needed for SBP greater 170, 5 days ordered  Hyperkalemia Continue Endo tool and aggressive fluid hydration  High anion gap metabolic acidosis Treatment per above and bicarb 50 mill equivalents IV one-time dose ordered  Hypophosphatemia Check phosphorus and magnesium  level on admission  Multiple myeloma not having achieved remission (HCC) Will hold home pomalidomide  on admission,  discussed with medical oncology  Chart  reviewed.   DVT prophylaxis: Apixaban  5 mg p.o. twice daily Code Status: Full code Diet: N.p.o. Family Communication: No, patient states his girlfriend Nicholas Weiss knows he is being admitted to the hospital Disposition Plan: Pending clinical course; guarded prognosis Consults called: None at this time Admission status: Stepdown, inpatient  Past Medical History:  Diagnosis Date   Acute metabolic encephalopathy 12/02/2020   DKA (diabetic ketoacidoses) 04/06/2016   Hypercholesteremia    Hypertension    Past Surgical History:  Procedure Laterality Date   AMPUTATION TOE Left 04/25/2022   Procedure: LEFT GREAT TOE AMPUTATION AND LEFT PARTIAL 2ND TOE AMPUTATION;  Surgeon: Dot Gazella, DPM;  Location: [facility] ORS;  Service: Podiatry;  Laterality: Left;   COLONOSCOPY WITH PROPOFOL  N/A 02/01/2020   Procedure: COLONOSCOPY WITH PROPOFOL ;  Surgeon: Selena Daily, MD;  Location: [facility] ENDOSCOPY;  Service: Gastroenterology;  Laterality: N/A;   ESOPHAGOGASTRODUODENOSCOPY  02/01/2020   Procedure: ESOPHAGOGASTRODUODENOSCOPY (EGD);  Surgeon: Selena Daily, MD;  Location: [facility] ENDOSCOPY;  Service: Gastroenterology;;   ESOPHAGOGASTRODUODENOSCOPY (EGD) WITH PROPOFOL  N/A 06/07/2022   Procedure: ESOPHAGOGASTRODUODENOSCOPY (EGD) WITH PROPOFOL ;  Surgeon: Genell Ken, MD;  Location: [facility] ENDOSCOPY;  Service: Gastroenterology;  Laterality: N/A;   KNEE SURGERY Right    Torn meniscus   KNEE SURGERY Left    Social History:  reports that he has never smoked. He has never used smokeless tobacco. He reports that he does not currently use alcohol. He reports current drug use. Drugs: Marijuana and "Crack" cocaine.  Allergies  Allergen Reactions   Penicillins Other (See Comments)    Childhood allergy -tolerated amoxil  03/2022 Unknown reaction Has patient had a PCN reaction causing immediate rash, facial/tongue/throat swelling, SOB or lightheadedness with hypotension: No Has patient had a PCN reaction causing severe  rash involving mucus membranes or skin necrosis: No Has patient had a PCN reaction that required hospitalization No Has patient had a PCN reaction occurring within the last 10 years: No If all of the above answers are "NO", then may proceed with Cephalosporin use.    Family History  Problem Relation Age of Onset   Heart attack Father    Hypertension Sister    Cancer Sister    Family history: Family history reviewed and not pertinent.  Prior to Admission medications   Medication Sig Start Date End Date Taking? Authorizing Provider  acetaminophen  (TYLENOL ) 500 MG tablet Take 1,000 mg by mouth daily as needed for mild pain or moderate pain.    [provider]  acyclovir  (ZOVIRAX ) 400 MG tablet Take 1 tablet (400 mg total) by mouth 2 (two) times daily. 08/13/23 11/11/23  Avonne Boettcher, MD  apixaban  (ELIQUIS ) 5 MG TABS tablet Take 1 tablet (5 mg total) by mouth 2 (two) times daily. Start Eliquis  after 2 weeks which will be 03/02/2023 03/02/23   Althia Atlas, MD  ascorbic acid  (VITAMIN C ) 500 MG tablet Take 1 tablet (500 mg total) by mouth daily. 04/09/22   Amin, Sumayya, MD  atorvastatin  (LIPITOR ) 80 MG tablet Take 1 tablet (80 mg total) by mouth at bedtime. Hold while taking Paxlovid  04/08/22   Amin, Sumayya, MD  bisacodyl  5 MG EC tablet Take 1 tablet (5 mg total) by mouth at bedtime. Skip the dose if no constipation 02/24/23   Althia Atlas, MD  Continuous Glucose Sensor (DEXCOM G7 SENSOR) MISC 1 each by Does not apply route 4 (four) times daily -  before meals and at bedtime. 04/10/23   Mosetta Areola, [employer]  B, MD  fludrocortisone  (FLORINEF ) 0.1 MG tablet Take 1 tablet (0.1 mg total) by mouth daily. Skip the dose if SBP >130 02/25/23   Althia Atlas, MD  gabapentin  (NEURONTIN ) 300 MG capsule Take 300 mg by mouth 2 (two) times daily. 03/03/23   [provider]  Insulin  Aspart FlexPen (NOVOLOG ) 100 UNIT/ML Sliding Scale: Glucose 70 - 120: 0 units Glucose 121 - 150: 1 unit Glucose 151 -  200: 2 units Glucose 201 - 250: 3 units Glucose 251 - 300: 5 units Glucose 301 - 350: 7 units Glucose 351 - 400: 9 units Glucose > 400: call your docotr 03/03/23   [provider]  insulin  degludec (TRESIBA  FLEXTOUCH) 100 UNIT/ML FlexTouch Pen Inject 5 Units into the skin daily. Home med. 04/10/23   Tiajuana Fluke, MD  insulin  lispro (HUMALOG) 100 UNIT/ML KwikPen  04/26/23   [provider]  iron  polysaccharides (NIFEREX) 150 MG capsule Take 1 capsule (150 mg total) by mouth daily. 03/20/23 07/21/23  Althia Atlas, MD  levETIRAcetam  (KEPPRA ) 500 MG tablet Take 1 tablet by mouth twice daily 08/09/23   Vaslow, [name] K, MD  Multiple Vitamin (MULTIVITAMIN WITH MINERALS) TABS tablet Take 1 tablet by mouth daily. 04/09/22   Luna Salinas, MD  ondansetron  (ZOFRAN ) 8 MG tablet Take 8 mg by mouth daily as needed for nausea or vomiting.    [provider]  pantoprazole  (PROTONIX ) 40 MG tablet Take 40 mg by mouth daily. 04/11/23   [provider]  polyethylene glycol (MIRALAX ) 17 g packet Take 17 g by mouth 2 (two) times daily. Skip the dose if no constipation 02/24/23   Althia Atlas, MD  pomalidomide  (POMALYST ) 2 MG capsule Take 1 capsule (2 mg total) by mouth daily. Take for 21 days, the hold for 7 days. Repeat every 28 days. 08/13/23   Avonne Boettcher, MD  traZODone  (DESYREL ) 50 MG tablet Take 0.5 tablets (25 mg total) by mouth at bedtime as needed for sleep. 02/24/23   Althia Atlas, MD   Physical Exam: Vitals:   08/15/23 1700 08/15/23 1800 08/15/23 1820 08/15/23 1829  BP: 139/88  135/68 (!) 155/88  Pulse: 94 100 97 96  Resp: 15 13 13 12   Temp:  97.7 F (36.5 C)    TempSrc:  Axillary    SpO2: 100% 100%    Weight:    63.7 kg  Height:    5\' 10"  (1.778 m)   Constitutional: appears frail, cachectic, weak Eyes: PERRL, lids and conjunctivae normal ENMT: Mucous membranes are dry. Posterior pharynx clear of any exudate or lesions. Age-appropriate dentition. Hearing  appropriate Neck: normal, supple, no masses, no thyromegaly Respiratory: clear to auscultation bilaterally, no wheezing, no crackles. Normal respiratory effort. No accessory muscle use.  Cardiovascular: Regular rate and rhythm, no murmurs / rubs / gallops. No extremity edema. 2+ pedal pulses. No carotid bruits.  Abdomen: Flat abdomen, no tenderness, no masses palpated, no hepatosplenomegaly. Bowel sounds positive.  Musculoskeletal: no clubbing / cyanosis.  Left 1st and 2nd toe amputation appears chronic and well-healed. Good ROM, no contractures, no atrophy. Normal muscle tone.  Skin: no rashes, lesions, ulcers. No induration Neurologic: Sensation intact. Strength 5/5 in all 4.  Psychiatric: Unable to assess judgment, insight as patient is in DKA.  Aaron Aas Alert and oriented x 3. Normal mood.   EKG: independently reviewed, showing atrial flutter with rate of 96, QTc 506  Chest x-ray on Admission: I personally reviewed and I agree with radiologist reading as below.  Portable chest x-ray (1 view) Result Date: 08/15/2023 CLINICAL DATA:  [id number] DKA (diabetic ketoacidosis) (HCC) [id number] EXAM: PORTABLE CHEST 1 VIEW COMPARISON:  April 05, 2023 FINDINGS: The cardiomediastinal silhouette is unchanged in contour.Atherosclerotic calcifications. Apices are obscured by overlapping chin. No pleural effusion. No pneumothorax. No acute pleuroparenchymal abnormality. Remote RIGHT-sided rib fractures. IMPRESSION: No acute cardiopulmonary abnormality. Electronically Signed   By: Clancy Crimes M.D.   On: 08/15/2023 17:49   Labs on Admission: I have personally reviewed following labs  CBC: Recent Labs  Lab 08/13/23 0941 08/15/23 1524  WBC 2.8* 4.5  NEUTROABS 1.2*  --   HGB 7.5* 10.3*  HCT 23.4* 37.3*  MCV 95.5 112.3*  PLT 189 230   Basic Metabolic Panel: Recent Labs  Lab 08/13/23 0941 08/15/23 1524 08/15/23 1724  NA 135 138  --   K 4.2 5.9*  --   CL 103 104  --   CO2 24 9*  --   GLUCOSE 149* 814*   --   BUN 32* 74*  --   CREATININE 1.45* 2.72*  --   CALCIUM  8.0* 8.8*  --   MG  --   --  2.3  PHOS  --   --  5.8*   GFR: Estimated Creatinine Clearance: 22.8 mL/min (A) (by C-G formula based on SCr of 2.72 mg/dL (H)).  Liver Function Tests: Recent Labs  Lab 08/13/23 0941  AST 27  ALT 39  ALKPHOS 58  BILITOT 0.5  PROT 7.6  ALBUMIN 3.3*   CBG: Recent Labs  Lab 08/15/23 1652 08/15/23 1734 08/15/23 1802 08/15/23 1817 08/15/23 1903  GLUCAP >600* >600* >600* >600* >600*   Urine analysis:    Component Value Date/Time   COLORURINE YELLOW (A) 07/21/2023 1919   APPEARANCEUR CLEAR (A) 07/21/2023 1919   LABSPEC 1.017 07/21/2023 1919   PHURINE 5.0 07/21/2023 1919   GLUCOSEU >=500 (A) 07/21/2023 1919   HGBUR NEGATIVE 07/21/2023 1919   BILIRUBINUR NEGATIVE 07/21/2023 1919   KETONESUR NEGATIVE 07/21/2023 1919   PROTEINUR 30 (A) 07/21/2023 1919   NITRITE NEGATIVE 07/21/2023 1919   LEUKOCYTESUR NEGATIVE 07/21/2023 1919   CRITICAL CARE Performed by: Dr. Reinhold Carbine  Total critical care time: 32 minutes  Critical care time was exclusive of separately billable procedures and treating other patients.  Critical care was necessary to treat or prevent imminent or life-threatening deterioration.  Critical care was time spent personally by me on the following activities: development of treatment plan with patient as well as nursing, discussions with consultants, evaluation of patient's response to treatment, examination of patient, obtaining history from patient or surrogate, ordering and performing treatments and interventions, ordering and review of laboratory studies, ordering and review of radiographic studies, pulse oximetry and re-evaluation of patient's condition.  This document was prepared using Dragon Voice Recognition software and may include unintentional dictation errors.  Dr. Reinhold Carbine [facility]  If 7PM-7AM, please contact overnight-coverage provider If 7AM-7PM, please  contact day attending provider [email or web address]  08/15/2023, 7:14 PM

## 2023-08-16 DIAGNOSIS — E1010 Type 1 diabetes mellitus with ketoacidosis without coma: Secondary | ICD-10-CM | POA: Diagnosis not present

## 2023-08-16 LAB — GLUCOSE, CAPILLARY
Glucose-Capillary: 100 mg/dL — ABNORMAL HIGH (ref 70–99)
Glucose-Capillary: 103 mg/dL — ABNORMAL HIGH (ref 70–99)
Glucose-Capillary: 103 mg/dL — ABNORMAL HIGH (ref 70–99)
Glucose-Capillary: 106 mg/dL — ABNORMAL HIGH (ref 70–99)
Glucose-Capillary: 107 mg/dL — ABNORMAL HIGH (ref 70–99)
Glucose-Capillary: 107 mg/dL — ABNORMAL HIGH (ref 70–99)
Glucose-Capillary: 127 mg/dL — ABNORMAL HIGH (ref 70–99)
Glucose-Capillary: 131 mg/dL — ABNORMAL HIGH (ref 70–99)
Glucose-Capillary: 142 mg/dL — ABNORMAL HIGH (ref 70–99)
Glucose-Capillary: 165 mg/dL — ABNORMAL HIGH (ref 70–99)
Glucose-Capillary: 169 mg/dL — ABNORMAL HIGH (ref 70–99)
Glucose-Capillary: 209 mg/dL — ABNORMAL HIGH (ref 70–99)
Glucose-Capillary: 210 mg/dL — ABNORMAL HIGH (ref 70–99)
Glucose-Capillary: 270 mg/dL — ABNORMAL HIGH (ref 70–99)
Glucose-Capillary: 342 mg/dL — ABNORMAL HIGH (ref 70–99)

## 2023-08-16 LAB — BASIC METABOLIC PANEL WITH GFR
Anion gap: 10 (ref 5–15)
Anion gap: 7 (ref 5–15)
BUN: 61 mg/dL — ABNORMAL HIGH (ref 8–23)
BUN: 66 mg/dL — ABNORMAL HIGH (ref 8–23)
CO2: 22 mmol/L (ref 22–32)
CO2: 23 mmol/L (ref 22–32)
Calcium: 8.2 mg/dL — ABNORMAL LOW (ref 8.9–10.3)
Calcium: 8.3 mg/dL — ABNORMAL LOW (ref 8.9–10.3)
Chloride: 113 mmol/L — ABNORMAL HIGH (ref 98–111)
Chloride: 114 mmol/L — ABNORMAL HIGH (ref 98–111)
Creatinine, Ser: 2.04 mg/dL — ABNORMAL HIGH (ref 0.61–1.24)
Creatinine, Ser: 2.36 mg/dL — ABNORMAL HIGH (ref 0.61–1.24)
GFR, Estimated: 29 mL/min — ABNORMAL LOW (ref >60)
GFR, Estimated: 34 mL/min — ABNORMAL LOW (ref >60)
Glucose, Bld: 122 mg/dL — ABNORMAL HIGH (ref 70–99)
Glucose, Bld: 350 mg/dL — ABNORMAL HIGH (ref 70–99)
Potassium: 3.3 mmol/L — ABNORMAL LOW (ref 3.5–5.1)
Potassium: 3.9 mmol/L (ref 3.5–5.1)
Sodium: 143 mmol/L (ref 135–145)
Sodium: 144 mmol/L (ref 135–145)

## 2023-08-16 LAB — KAPPA/LAMBDA LIGHT CHAINS
Kappa free light chain: 3.3 mg/L (ref 3.3–19.4)
Kappa, lambda light chain ratio: 0.02 — ABNORMAL LOW (ref 0.26–1.65)
Lambda free light chains: 165.5 mg/L — ABNORMAL HIGH (ref 5.7–26.3)

## 2023-08-16 LAB — CBC
HCT: 23.4 % — ABNORMAL LOW (ref 39.0–52.0)
Hemoglobin: 7.6 g/dL — ABNORMAL LOW (ref 13.0–17.0)
MCH: 30.9 pg (ref 26.0–34.0)
MCHC: 32.5 g/dL (ref 30.0–36.0)
MCV: 95.1 fL (ref 80.0–100.0)
Platelets: 241 10*3/uL (ref 150–400)
RBC: 2.46 MIL/uL — ABNORMAL LOW (ref 4.22–5.81)
RDW: 16.1 % — ABNORMAL HIGH (ref 11.5–15.5)
WBC: 3.7 10*3/uL — ABNORMAL LOW (ref 4.0–10.5)
nRBC: 2.7 % — ABNORMAL HIGH (ref 0.0–0.2)

## 2023-08-16 LAB — HEPARIN LEVEL (UNFRACTIONATED): Heparin Unfractionated: >1.1 [IU]/mL — ABNORMAL HIGH (ref 0.30–0.70)

## 2023-08-16 LAB — BETA-HYDROXYBUTYRIC ACID: Beta-Hydroxybutyric Acid: 0.16 mmol/L (ref 0.05–0.27)

## 2023-08-16 LAB — TROPONIN I (HIGH SENSITIVITY)
Troponin I (High Sensitivity): 186 ng/L (ref <18)
Troponin I (High Sensitivity): 146 ng/L (ref <18)

## 2023-08-16 LAB — APTT: aPTT: 111 s — ABNORMAL HIGH (ref 24–36)

## 2023-08-16 MED ORDER — APIXABAN 5 MG PO TABS
5.0000 mg | ORAL_TABLET | Freq: Two times a day (BID) | ORAL | Status: DC
  Administered 2023-08-16 – 2023-08-19 (×6): 5 mg via ORAL
  Filled 2023-08-16 (×6): qty 1

## 2023-08-16 MED ORDER — INSULIN ASPART 100 UNIT/ML IJ SOLN: 0.0000 [IU] | Freq: Three times a day (TID) | INTRAMUSCULAR | Status: DC

## 2023-08-16 MED ORDER — INSULIN GLARGINE-YFGN 100 UNIT/ML ~~LOC~~ SOLN
5.0000 [IU] | Freq: Every day | SUBCUTANEOUS | Status: DC
Start: 2023-08-17 — End: 2023-08-19
  Administered 2023-08-17 – 2023-08-19 (×3): 5 [IU] via SUBCUTANEOUS
  Filled 2023-08-16 (×3): qty 0.05

## 2023-08-16 MED ORDER — INSULIN GLARGINE-YFGN 100 UNIT/ML ~~LOC~~ SOLN
5.0000 [IU] | Freq: Once | SUBCUTANEOUS | Status: AC
  Administered 2023-08-16: 5 [IU] via SUBCUTANEOUS
  Filled 2023-08-16: qty 0.05

## 2023-08-16 MED ORDER — POTASSIUM CHLORIDE 10 MEQ/100ML IV SOLN
10.0000 meq | INTRAVENOUS | Status: AC
  Administered 2023-08-16 (×4): 10 meq via INTRAVENOUS
  Filled 2023-08-16 (×4): qty 100

## 2023-08-16 MED ORDER — GLUCERNA SHAKE PO LIQD
237.0000 mL | Freq: Three times a day (TID) | ORAL | Status: DC
  Administered 2023-08-16 – 2023-08-19 (×8): 237 mL via ORAL

## 2023-08-16 MED ORDER — ADULT MULTIVITAMIN W/MINERALS CH
1.0000 | ORAL_TABLET | Freq: Every day | ORAL | Status: DC
  Administered 2023-08-16 – 2023-08-19 (×4): 1 via ORAL
  Filled 2023-08-16 (×4): qty 1

## 2023-08-16 MED ORDER — INSULIN ASPART 100 UNIT/ML IJ SOLN
0.0000 [IU] | INTRAMUSCULAR | Status: DC
  Administered 2023-08-16: 1 [IU] via SUBCUTANEOUS
  Administered 2023-08-16: 2 [IU] via SUBCUTANEOUS
  Administered 2023-08-17: 3 [IU] via SUBCUTANEOUS
  Administered 2023-08-17: 2 [IU] via SUBCUTANEOUS
  Administered 2023-08-17 – 2023-08-18 (×2): 1 [IU] via SUBCUTANEOUS
  Administered 2023-08-18: 4 [IU] via SUBCUTANEOUS
  Administered 2023-08-18: 3 [IU] via SUBCUTANEOUS
  Administered 2023-08-19: 5 [IU] via SUBCUTANEOUS
  Administered 2023-08-19: 4 [IU] via SUBCUTANEOUS
  Filled 2023-08-16 (×10): qty 1

## 2023-08-16 NOTE — Progress Notes (Signed)
 Initial Nutrition Assessment  DOCUMENTATION CODES:   Non-severe (moderate) malnutrition in context of chronic illness  INTERVENTION:   -Continue dysphagia 3 diet -MVI with minerals daily -Glucerna Shake po TID, each supplement provides 220 kcal and 10 grams of protein  -RD provided "Plate Method" and "Carbohydrate Counting for People with Diabetes" handout from [facility] Nutrition Care Manual; attached to AVS/ Discharge summary  -RD referred pt to [facility] for further support and reinforcement   NUTRITION DIAGNOSIS:   Moderate Malnutrition related to chronic illness (multiple myeloma) as evidenced by mild fat depletion, mild muscle depletion, moderate muscle depletion, percent weight loss.  GOAL:   Patient will meet greater than or equal to 90% of their needs  MONITOR:   PO intake, Supplement acceptance  REASON FOR ASSESSMENT:   Consult Assessment of nutrition requirement/status  ASSESSMENT:   Pt with history of neuropathy, GERD, hyperlipidemia, insulin -dependent diabetes mellitus controlled and on insulin  pump, atrial flutter on apixaban , multiple myeloma, who presents for chief concerns of hyperglycemia.  Pt admitted with DKA.   5/19- s/p BSE- advanced to dysphagia 3 diet with thin liquids  Reviewed I/O's: +4.5 L x 24 hours  UOP: 300 ml x 24 hours  Per oncology notes, pt started on cycle 9 of darzelex on 08/12/24 for multipe myeloma.   Case discussed with RN, who reports concern over pt being able to manage his DM at home. She suspects pt may have at least some acute cognitive deficits.   Spoke with pt at bedside, who was pleasant and in good spirits today. Pt answered close ended questions, but unable to provide reliable history. Pt was able to recall SLP visit with probing. He is excited to eat lunch and is amenable to dysphagia 3 diet. Pt reports he has access to dentures at home, but rarely uses them while eating. He  reports good appetite PTA, usually consuming 3 meals per day (Breakfast: eggs and sausage; Lunch and dinner: meat, starch, and vegetable).   Pt able to confirm that his insulin  pump stopped during a few days PTA and was managing with subcutaneous insulin  shots per the recommendation of his MD.   Reviewed wt hx; pt has experienced a 5% wt loss over the past month, which is significant for time frame. Of note, pt appears to have improved fat and muscle stores in comparison to prior exams. Pt thinks his weight has "leveled off" over the past few months.   Discussed importance of good meal and supplement intake to promote healing. Pt amenable to supplements.   Medications reviewed and include neurontin , keppra , dextrose  5% in lactated ringers  infusion @ 125 ml/hr  Lab Results  Component Value Date   HGBA1C 10.0 (H) 07/21/2023   PTA DM medications are 5 units insulin  degludec daily and SSI insulin  aspart.   Labs reviewed: Phos: 5.8, CBGS: 103-131 (inpatient orders for glycemic control are 0-6 units insulin  aspart every 4 hours and 5 units insulin  glargine-yfgn daily).    NUTRITION - FOCUSED PHYSICAL EXAM:  Flowsheet Row Most Recent Value  Orbital Region Mild depletion  Upper Arm Region No depletion  Thoracic and Lumbar Region No depletion  Buccal Region Mild depletion  Temple Region Mild depletion  Clavicle Bone Region No depletion  Clavicle and Acromion Bone Region No depletion  Scapular Bone Region No depletion  Dorsal Hand Moderate depletion  Patellar Region Mild depletion  Anterior Thigh Region Mild depletion  Posterior Calf Region Mild depletion  Edema (RD Assessment) None  Hair Reviewed  Eyes Reviewed  Mouth Reviewed  Skin Reviewed  Nails Reviewed       Diet Order:   Diet Order             DIET DYS 3 Room service appropriate? Yes; Fluid consistency: Thin  Diet effective now                   EDUCATION NEEDS:   Education needs have been addressed  Skin:   Skin Assessment: Reviewed RN Assessment  Last BM:  Unknown  Height:   Ht Readings from Last 1 Encounters:  08/15/23 5\' 10"  (1.778 m)    Weight:   Wt Readings from Last 1 Encounters:  08/15/23 63.7 kg    Ideal Body Weight:  75.5 kg  BMI:  Body mass index is 20.15 kg/m.  Estimated Nutritional Needs:   Kcal:  1900-2100  Protein:  90-105 grams  Fluid:  1.9-2.1 L    Herschel Lords, RD, LDN, CDCES Registered Dietitian III Certified Diabetes Care and Education Specialist If unable to reach this RD, please use "RD Inpatient" group chat on secure chat between hours of 8am-4 pm daily

## 2023-08-16 NOTE — Progress Notes (Signed)
 2100 - Dr. Achilles Holes notified of Troponin 170 and blood glucose 663. New order received for Heparin  drip and D/C Eliquis .   2122 - Pt currently NPO. Secure chat sent to Dr. Achilles Holes to clarify diet order. Verbal order to change diet to NPO, sips w/ meds. Pt did take Keppra , but allowed it to dissolve in mouth and would not swallow it.   0051 - Dr. Achilles Holes notified of Potassium 3.3. Verbal order received for Potassium 10 mEq IV x4 (40mEq total).   54 - Advised Dr. Achilles Holes of Endotool's recommendation to transition based off CBG and lab results. Awaiting response.   9604 - Verbal orders for transition received (see EMAR).

## 2023-08-16 NOTE — Discharge Instructions (Addendum)
 Plate Method for Diabetes   Foods with carbohydrates make your blood glucose level go up. The plate method is a simple way to meal plan and control the amount of carbohydrate you eat.         Use the following guidance to build a healthy plate to control carbohydrates. Divide a 9-inch plate into 3 sections, and consider your beverage the 4th section of your meal: Food Group Examples of Foods/Beverages for This Section of your Meal  Section 1: Non-starchy vegetables Fill  of your plate to include non-starchy vegetables Asparagus, broccoli, brussels sprouts, cabbage, carrots, cauliflower, celery, cucumber, green beans, mushrooms, peppers, salad greens, tomatoes, or zucchini.  Section 2: Protein foods Fill  of your plate to include a lean protein Lean meat, poultry, fish, seafood, cheese, eggs, lean deli meat, tofu, beans, lentils, nuts or nut butters.  Section 3: Carbohydrate foods Fill  of your plate to include carbohydrate foods Whole grains, whole wheat bread, brown rice, whole grain pasta, polenta, corn tortillas, fruit, or starchy vegetables (potatoes, green peas, corn, beans, acorn squash, and butternut squash). One cup of milk also counts as a food that contains carbohydrate.  Section 4: Beverage Choose water or a low-calorie drink for your beverage. Unsweetened tea, coffee, or flavored/sparkling water without added sugar.  Image reprinted with permission from The American Diabetes Association.  Copyright 2022 by the American Diabetes Association.   Copyright 2022  Academy of Nutrition and Dietetics. All rights reserved    Carbohydrate Counting For People With Diabetes  Foods with carbohydrates make your blood glucose level go up. Learning how to count carbohydrates can help you control your blood glucose levels. First, identify the foods you eat that contain carbohydrates. Then, using the Foods with Carbohydrates chart, determine about how much carbohydrates are in your meals and  snacks. Make sure you are eating foods with fiber, protein, and healthy fat along with your carbohydrate foods. Foods with Carbohydrates The following table shows carbohydrate foods that have about 15 grams of carbohydrate each. Using measuring cups, spoons, or a food scale when you first begin learning about carbohydrate counting can help you learn about the portion sizes you typically eat. The following foods have 15 grams carbohydrate each:  Grains 1 slice bread (1 ounce)  1 small tortilla (6-inch size)   large bagel (1 ounce)  1/3 cup pasta or rice (cooked)   hamburger or hot dog bun ( ounce)   cup cooked cereal   to  cup ready-to-eat cereal  2 taco shells (5-inch size) Fruit 1 small fresh fruit ( to 1 cup)   medium banana  17 small grapes (3 ounces)  1 cup melon or berries   cup canned or frozen fruit  2 tablespoons dried fruit (blueberries, cherries, cranberries, raisins)   cup unsweetened fruit juice  Starchy Vegetables  cup cooked beans, peas, corn, potatoes/sweet potatoes   large baked potato (3 ounces)  1 cup acorn or butternut squash  Snack Foods 3 to 6 crackers  8 potato chips or 13 tortilla chips ( ounce to 1 ounce)  3 cups popped popcorn  Dairy 3/4 cup (6 ounces) nonfat plain yogurt, or yogurt with sugar-free sweetener  1 cup milk  1 cup plain rice, soy, coconut or flavored almond milk Sweets and Desserts  cup ice cream or frozen yogurt  1 tablespoon jam, jelly, pancake syrup, table sugar, or honey  2 tablespoons light pancake syrup  1 inch square of frosted cake or 2 inch square of  unfrosted cake  2 small cookies (2/3 ounce each) or  large cookie  Sometimes you'll have to estimate carbohydrate amounts if you don't know the exact recipe. One cup of mixed foods like soups can have 1 to 2 carbohydrate servings, while some casseroles might have 2 or more servings of carbohydrate. Foods that have less than 20 calories in each serving can be counted as  "free" foods. Count 1 cup raw vegetables, or  cup cooked non-starchy vegetables as "free" foods. If you eat 3 or more servings at one meal, then count them as 1 carbohydrate serving.  Foods without Carbohydrates  Not all foods contain carbohydrates. Meat, some dairy, fats, non-starchy vegetables, and many beverages don't contain carbohydrate. So when you count carbohydrates, you can generally exclude chicken, pork, beef, fish, seafood, eggs, tofu, cheese, butter, sour cream, avocado, nuts, seeds, olives, mayonnaise, water, black coffee, unsweetened tea, and zero-calorie drinks. Vegetables with no or low carbohydrate include green beans, cauliflower, tomatoes, and onions. How much carbohydrate should I eat at each meal?  Carbohydrate counting can help you plan your meals and manage your weight. Following are some starting points for carbohydrate intake at each meal. Work with your registered dietitian nutritionist to find the best range that works for your blood glucose and weight.   To Lose Weight To Maintain Weight  Women 2 - 3 carb servings 3 - 4 carb servings  Men 3 - 4 carb servings 4 - 5 carb servings  Checking your blood glucose after meals will help you know if you need to adjust the timing, type, or number of carbohydrate servings in your meal plan. Achieve and keep a healthy body weight by balancing your food intake and physical activity.  Tips How should I plan my meals?  Plan for half the food on your plate to include non-starchy vegetables, like salad greens, broccoli, or carrots. Try to eat 3 to 5 servings of non-starchy vegetables every day. Have a protein food at each meal. Protein foods include chicken, fish, meat, eggs, or beans (note that beans contain carbohydrate). These two food groups (non-starchy vegetables and proteins) are low in carbohydrate. If you fill up your plate with these foods, you will eat less carbohydrate but still fill up your stomach. Try to limit your carbohydrate  portion to  of the plate.  What fats are healthiest to eat?  Diabetes increases risk for heart disease. To help protect your heart, eat more healthy fats, such as olive oil, nuts, and avocado. Eat less saturated fats like butter, cream, and high-fat meats, like bacon and sausage. Avoid trans fats, which are in all foods that list "partially hydrogenated oil" as an ingredient. What should I drink?  Choose drinks that are not sweetened with sugar. The healthiest choices are water, carbonated or seltzer waters, and tea and coffee without added sugars.  Sweet drinks will make your blood glucose go up very quickly. One serving of soda or energy drink is  cup. It is best to drink these beverages only if your blood glucose is low.  Artificially sweetened, or diet drinks, typically do not increase your blood glucose if they have zero calories in them. Read labels of beverages, as some diet drinks do have carbohydrate and will raise your blood glucose. Label Reading Tips Read Nutrition Facts labels to find out how many grams of carbohydrate are in a food you want to eat. Don't forget: sometimes serving sizes on the label aren't the same as how much food  you are going to eat, so you may need to calculate how much carbohydrate is in the food you are serving yourself.   Carbohydrate Counting for People with Diabetes Sample 1-Day Menu  Breakfast  cup yogurt, low fat, low sugar (1 carbohydrate serving)   cup cereal, ready-to-eat, unsweetened (1 carbohydrate serving)  1 cup strawberries (1 carbohydrate serving)   cup almonds ( carbohydrate serving)  Lunch 1, 5 ounce can chunk light tuna  2 ounces cheese, low fat cheddar  6 whole wheat crackers (1 carbohydrate serving)  1 small apple (1 carbohydrate servings)   cup carrots ( carbohydrate serving)   cup snap peas  1 cup 1% milk (1 carbohydrate serving)   Evening Meal Stir fry made with: 3 ounces chicken  1 cup brown rice (3 carbohydrate servings)    cup broccoli ( carbohydrate serving)   cup green beans   cup onions  1 tablespoon olive oil  2 tablespoons teriyaki sauce ( carbohydrate serving)  Evening Snack 1 extra small banana (1 carbohydrate serving)  1 tablespoon peanut butter   Carbohydrate Counting for People with Diabetes Vegan Sample 1-Day Menu  Breakfast 1 cup cooked oatmeal (2 carbohydrate servings)   cup blueberries (1 carbohydrate serving)  2 tablespoons flaxseeds  1 cup soymilk fortified with calcium  and vitamin D   1 cup coffee  Lunch 2 slices whole wheat bread (2 carbohydrate servings)   cup baked tofu   cup lettuce  2 slices tomato  2 slices avocado   cup baby carrots ( carbohydrate serving)  1 orange (1 carbohydrate serving)  1 cup soymilk fortified with calcium  and vitamin D    Evening Meal Burrito made with: 1 6-inch corn tortilla (1 carbohydrate serving)  1 cup refried vegetarian beans (2 carbohydrate servings)   cup chopped tomatoes   cup lettuce   cup salsa  1/3 cup brown rice (1 carbohydrate serving)  1 tablespoon olive oil for rice   cup zucchini   Evening Snack 6 small whole grain crackers (1 carbohydrate serving)  2 apricots ( carbohydrate serving)   cup unsalted peanuts ( carbohydrate serving)    Carbohydrate Counting for People with Diabetes Vegetarian (Lacto-Ovo) Sample 1-Day Menu  Breakfast 1 cup cooked oatmeal (2 carbohydrate servings)   cup blueberries (1 carbohydrate serving)  2 tablespoons flaxseeds  1 egg  1 cup 1% milk (1 carbohydrate serving)  1 cup coffee  Lunch 2 slices whole wheat bread (2 carbohydrate servings)  2 ounces low-fat cheese   cup lettuce  2 slices tomato  2 slices avocado   cup baby carrots ( carbohydrate serving)  1 orange (1 carbohydrate serving)  1 cup unsweetened tea  Evening Meal Burrito made with: 1 6-inch corn tortilla (1 carbohydrate serving)   cup refried vegetarian beans (1 carbohydrate serving)   cup tomatoes   cup lettuce    cup salsa  1/3 cup brown rice (1 carbohydrate serving)  1 tablespoon olive oil for rice   cup zucchini  1 cup 1% milk (1 carbohydrate serving)  Evening Snack 6 small whole grain crackers (1 carbohydrate serving)  2 apricots ( carbohydrate serving)   cup unsalted peanuts ( carbohydrate serving)    Copyright 2020  Academy of Nutrition and Dietetics. All rights reserved.  Using Nutrition Labels: Carbohydrate  Serving Size  Look at the serving size. All the information on the label is based on this portion. Servings Per Container  The number of servings contained in the package. Guidelines for Carbohydrate  Look at  the total grams of carbohydrate in the serving size.  1 carbohydrate choice = 15 grams of carbohydrate. Range of Carbohydrate Grams Per Choice  Carbohydrate Grams/Choice Carbohydrate Choices  6-10   11-20 1  21-25 1  26-35 2  36-40 2  41-50 3  51-55 3  56-65 4  66-70 4  71-80 5    Copyright 2020  Academy of Nutrition and Dietetics. All rights reserved.   [facility].  [address] , [place], [place], [address]. [phone] They will call you to arrange when they can come to see you

## 2023-08-16 NOTE — Evaluation (Signed)
 Clinical/Bedside Swallow Evaluation Patient Details  Name: Nicholas Weiss MRN: [id number] Date of Birth: 06/23/1950  Today's Date: 08/16/2023 Time: SLP Start Time (ACUTE ONLY): 0945 SLP Stop Time (ACUTE ONLY): 1005 SLP Time Calculation (min) (ACUTE ONLY): 20 min  Past Medical History:  Past Medical History:  Diagnosis Date   Acute metabolic encephalopathy 12/02/2020   DKA (diabetic ketoacidoses) 04/06/2016   Hypercholesteremia    Hypertension    Past Surgical History:  Past Surgical History:  Procedure Laterality Date   AMPUTATION TOE Left 04/25/2022   Procedure: LEFT GREAT TOE AMPUTATION AND LEFT PARTIAL 2ND TOE AMPUTATION;  Surgeon: Dot Gazella, DPM;  Location: [facility] ORS;  Service: Podiatry;  Laterality: Left;   COLONOSCOPY WITH PROPOFOL  N/A 02/01/2020   Procedure: COLONOSCOPY WITH PROPOFOL ;  Surgeon: Selena Daily, MD;  Location: [facility] ENDOSCOPY;  Service: Gastroenterology;  Laterality: N/A;   ESOPHAGOGASTRODUODENOSCOPY  02/01/2020   Procedure: ESOPHAGOGASTRODUODENOSCOPY (EGD);  Surgeon: Selena Daily, MD;  Location: [facility] ENDOSCOPY;  Service: Gastroenterology;;   ESOPHAGOGASTRODUODENOSCOPY (EGD) WITH PROPOFOL  N/A 06/07/2022   Procedure: ESOPHAGOGASTRODUODENOSCOPY (EGD) WITH PROPOFOL ;  Surgeon: Genell Ken, MD;  Location: [facility] ENDOSCOPY;  Service: Gastroenterology;  Laterality: N/A;   KNEE SURGERY Right    Torn meniscus   KNEE SURGERY Left    HPI:  Per H&P, "[name] is a 38m with history of neuropathy, GERD, hyperlipidemia, insulin -dependent diabetes mellitus controlled and on insulin  pump, atrial flutter on apixaban , multiple myeloma, who presents emergency department for chief concerns of hyperglycemia." CXR 08/15/23: No acute cardiopulmonary abnormality.    Assessment / Plan / Recommendation  Clinical Impression  Pt seen for bedside swallow assessment in the setting of concern for oral holding with medication. Pt seen with trials of thin liquids (via straw),  mixed consistencies (thin liquid with pill administration) and regular solids. No overt or subtle s/sx pharyngeal dysphagia noted. No change to vocal quality across trials. Vitals stable for duration of trials. Oral phase impacted by edentulous nature, with pt reporting consuming soft solids at baseline. Extended time for mastication and liquid wash utilized for oral clearance. Intermittent belching noted, suspect related to hx of GERD. Based on hx of GERD, fluctuating mentation per chart review, and current debility, pt is at increased risk of aspiration; however, current risk in managed with general aspiration precautions (slow rate, small bites, elevated HOB, and alert for PO intake). Recommend mech soft solids (to aid mastication) and thin liquids. MD and RN aware of recommendations. No follow up SLP services indicated.   SLP Visit Diagnosis: Dysphagia, unspecified (R13.10)    Aspiration Risk  Mild aspiration risk    Diet Recommendation   Dysphagia 3 (mechanical soft);Thin  Medication Administration: Whole meds with liquid    Other  Recommendations Oral Care Recommendations: Oral care BID;Staff/trained caregiver to provide oral care    Recommendations for follow up therapy are one component of a multi-disciplinary discharge planning process, led by the attending physician.  Recommendations may be updated based on patient status, additional functional criteria and insurance authorization.  Follow up Recommendations No SLP follow up      Assistance Recommended at Discharge  Assistance for Po intake as needed.  Functional Status Assessment Patient has not had a recent decline in their functional status (in regards to oropharyngeal function)    Swallow Study   General Date of Onset: 08/16/23 HPI: Per H&P, "[name] is a 66m with history of neuropathy, GERD, hyperlipidemia, insulin -dependent diabetes mellitus controlled and on insulin  pump, atrial flutter  on apixaban , multiple  myeloma, who presents emergency department for chief concerns of hyperglycemia." CXR 08/15/23: No acute cardiopulmonary abnormality. Type of Study: Bedside Swallow Evaluation Previous Swallow Assessment: no swallow assessment in chart- pt with hx of cog ling evals Diet Prior to this Study: NPO Temperature Spikes Noted: No Respiratory Status: Room air History of Recent Intubation: No Behavior/Cognition: Alert;Cooperative;Pleasant mood Oral Cavity Assessment: Within Functional Limits Oral Care Completed by SLP: Yes Oral Cavity - Dentition: Edentulous (dentures at home per patient) Vision: Functional for self-feeding Self-Feeding Abilities: Needs assist;Needs set up Patient Positioning: Upright in bed Baseline Vocal Quality: Normal Volitional Cough: Strong Volitional Swallow: Unable to elicit    Oral/Motor/Sensory Function Overall Oral Motor/Sensory Function: Within functional limits   Ice Chips Ice chips: Not tested   Thin Liquid Thin Liquid: Within functional limits Presentation: Straw;Self Fed    Nectar Thick Nectar Thick Liquid: Not tested   Honey Thick Honey Thick Liquid: Not tested   Puree Puree: Not tested   Solid     Solid: Within functional limits Presentation: Self Fed Other Comments: min increased time in the setting of reduced dentition     Nicholas Zetha Kuhar Clapp, MS, CCC-SLP Speech Language Pathologist Rehab Services; Nicholas Weiss [phone] (ascom)   Nicholas Weiss 08/16/2023,10:59 AM

## 2023-08-16 NOTE — Progress Notes (Signed)
 PHARMACY - ANTICOAGULATION CONSULT NOTE  Pharmacy Consult for Heparin  Infusion Indication: chest pain/ACS  Allergies  Allergen Reactions   Penicillins Other (See Comments)    Childhood allergy -tolerated amoxil  03/2022 Unknown reaction Has patient had a PCN reaction causing immediate rash, facial/tongue/throat swelling, SOB or lightheadedness with hypotension: No Has patient had a PCN reaction causing severe rash involving mucus membranes or skin necrosis: No Has patient had a PCN reaction that required hospitalization No Has patient had a PCN reaction occurring within the last 10 years: No If all of the above answers are "NO", then may proceed with Cephalosporin use.     Patient Measurements: Height: 5\' 10"  (177.8 cm) Weight: 63.7 kg (140 lb 6.9 oz) IBW/kg (Calculated) : 73 HEPARIN  DW (KG): 63.7  Vital Signs: Temp: 98.2 F (36.8 C) (05/19 0400) Temp Source: Oral (05/19 0400) BP: 146/97 (05/19 0900) Pulse Rate: 73 (05/19 0900)  Labs: Recent Labs    08/15/23 1524 08/15/23 1822 08/15/23 1948 08/15/23 2129 08/15/23 2349 08/16/23 0431 08/16/23 0733 08/16/23 1159  HGB 10.3*  --   --   --   --  7.6*  --   --   HCT 37.3*  --   --   --   --  23.4*  --   --   PLT 230  --   --   --   --  241  --   --   APTT  --   --   --  29  --   --  111*  --   LABPROT  --   --   --  17.6*  --   --   --   --   INR  --   --   --  1.4*  --   --   --   --   HEPARINUNFRC  --   --   --  >1.10*  --   --  >1.10*  --   CREATININE 2.72*  --  2.63*  --  2.36* 2.04*  --   --   TROPONINIHS  --    < > 170* 162* 186*  --   --  146*   < > = values in this interval not displayed.    Estimated Creatinine Clearance: 30.4 mL/min (A) (by C-G formula based on SCr of 2.04 mg/dL (H)).   Medical History: Past Medical History:  Diagnosis Date   Acute metabolic encephalopathy 12/02/2020   DKA (diabetic ketoacidoses) 04/06/2016   Hypercholesteremia    Hypertension     Medications:  On apixaban  5 mg bid  at home  Assessment: Patient is a 73 year old male who presented with hyperglycemia. Upon evaluation, patient noted to have elevated troponin of 161>170. Pharmacy consulted to initiate patient on heparin  infusion.   Baseline Labs: Hgb 10.3 PLT 230 INR ordered aPTT ordered Heparin  level ordered  Goal of Therapy:  Heparin  level 0.3-0.7 units/ml aPTT 66-102 seconds Monitor platelets by anticoagulation protocol: Yes  Date/Time: aPTT/HL: Comment/Rate: 5/19@0733  111/>1.10 SUPRAtherapeutic@750  units/hr   Plan:  Decrease heparin  infusion to a rate of 650 units/hr Check aPTT level in 8 hours and heparin  level daily Plan is to monitor via aPTT levels until heparin  level correlates Monitor CBC daily while on heparin   Brentney Goldbach A Kacee Sukhu, PharmD Clinical Pharmacist 08/16/2023 2:32 PM

## 2023-08-16 NOTE — Inpatient Diabetes Management (Signed)
 Inpatient Diabetes Program Recommendations  AACE/ADA: New Consensus Statement on Inpatient Glycemic Control (2015)  Target Ranges:  Prepandial:   less than 140 mg/dL      Peak postprandial:   less than 180 mg/dL (1-2 hours)      Critically ill patients:  140 - 180 mg/dL   Lab Results  Component Value Date   GLUCAP 106 (H) 08/16/2023   HGBA1C 10.0 (H) 07/21/2023    Review of Glycemic Control  Latest Reference Range & Units 08/16/23 05:29 08/16/23 06:38 08/16/23 07:32 08/16/23 07:40 08/16/23 08:36 08/16/23 11:23  Glucose-Capillary 70 - 99 mg/dL 161 (H) 096 (H) 045 (H) 107 (H) 103 (H) 106 (H)  (H): Data is abnormally high Diabetes history: DM 1 Outpatient Diabetes medications:  Insulin  pump at home (T-slim) Basal rate 12am 0.25 units/hr (24hr = 8 units) Bolus settings ISF 60 I:C ratio 20 Target 120  Off pump regimen: Novolog  1-9 units tid with meals  Tresiba  5 units daily Current orders for Inpatient glycemic control: Novolog  0-6 units Q4h, Semglee  5 units every day  Noted consult. DM familiar with patient due to multiple admissions for DKA.  Attempted to reach [facility], patient's caregiver. Went to voicemail x 2. Per secure chat, [name] has left for the day but will plan to return tomorrow and wants to speak with DM team. Will have DM team re-attempt 5/20.   Thanks, Marjo Sievert, MSN, RNC-OB Diabetes Coordinator [phone] (8a-5p)

## 2023-08-16 NOTE — Progress Notes (Signed)
 PROGRESS NOTE    Nicholas Weiss  ZOX:[id number] DOB: 1950-09-22 DOA: 08/15/2023 PCP: Monique Ano, MD   Assessment & Plan:   Principal Problem:   DKA (diabetic ketoacidosis) (HCC) Active Problems:   AKI (acute kidney injury) (HCC)   Elevated troponin   Uncontrolled type 1 diabetes mellitus with hypoglycemia, with long-term current use of insulin  (HCC)   Hyperkalemia   Protein-calorie malnutrition, severe   Hypertension   Dyslipidemia   Multiple myeloma not having achieved remission (HCC)   Hypophosphatemia   High anion gap metabolic acidosis  Assessment and Plan: DKA: resolved. Likely secondary to poorly controlled DM  DM1: likely poorly controlled. D/c IV insulin  drip and started on glargine, SSI w/ accuchecks    Elevated troponin: likely secondary to demand ischemia. D/c IV heparin  drip.   AKI: Cr is trending down daily. Avoid nephrotoxic   Hyperkalemia: resolved    High anion gap metabolic acidosis: resolved   Hypophosphatemia: resolved    Multiple myeloma:  not having achieved remission. Holding home dose of pomalidomide    Memory deficits: as per pt. Re-orient prn    DVT prophylaxis: eliquis  Code Status: full  Family Communication: called pt's girlfriend, [name], but no answer & unable to leave a voicemail  Disposition Plan: depends on PT/OT recs   Level of care: Stepdown    Consultants:    Procedures:   Antimicrobials:   Subjective: Pt c/o fatigue   Objective: Vitals:   08/16/23 0500 08/16/23 0600 08/16/23 0700 08/16/23 0800  BP: 104/73 129/84 (!) 144/116 (!) 146/84  Pulse: (!) 57 66 (!) 54 (!) 30  Resp: 13 15 12 12   Temp:      TempSrc:      SpO2: 100% 100% 100% 100%  Weight:      Height:        Intake/Output Summary (Last 24 hours) at 08/16/2023 0859 Last data filed at 08/16/2023 0841 Gross per 24 hour  Intake 5027.92 ml  Output 300 ml  Net 4727.92 ml   Filed Weights   08/15/23 1518 08/15/23 1829  Weight: 68 kg 63.7 kg     Examination:  General exam: Appears calm and comfortable  Respiratory system: Clear to auscultation. Respiratory effort normal. Cardiovascular system: S1 & S2 +. No rubs, gallops or clicks.. Gastrointestinal system: Abdomen is nondistended, soft and nontender.Normal bowel sounds heard. Central nervous system: Alert and oriented x self only.  Psychiatry: Judgement and insight appears poor. Flat mood an affect      Data Reviewed: I have personally reviewed following labs and imaging studies  CBC: Recent Labs  Lab 08/13/23 0941 08/15/23 1524 08/16/23 0431  WBC 2.8* 4.5 3.7*  NEUTROABS 1.2*  --   --   HGB 7.5* 10.3* 7.6*  HCT 23.4* 37.3* 23.4*  MCV 95.5 112.3* 95.1  PLT 189 230 241   Basic Metabolic Panel: Recent Labs  Lab 08/13/23 0941 08/15/23 1524 08/15/23 1724 08/15/23 1948 08/15/23 2349 08/16/23 0431  NA 135 138  --  144 143 144  K 4.2 5.9*  --  4.4 3.3* 3.9  CL 103 104  --  111 113* 114*  CO2 24 9*  --  14* 22 23  GLUCOSE 149* 814*  --  663* 350* 122*  BUN 32* 74*  --  71* 66* 61*  CREATININE 1.45* 2.72*  --  2.63* 2.36* 2.04*  CALCIUM  8.0* 8.8*  --  8.3* 8.2* 8.3*  MG  --   --  2.3  --   --   --  PHOS  --   --  5.8*  --   --   --    GFR: Estimated Creatinine Clearance: 30.4 mL/min (A) (by C-G formula based on SCr of 2.04 mg/dL (H)). Liver Function Tests: Recent Labs  Lab 08/13/23 0941  AST 27  ALT 39  ALKPHOS 58  BILITOT 0.5  PROT 7.6  ALBUMIN 3.3*   No results for input(s): "LIPASE", "AMYLASE" in the last 168 hours. No results for input(s): "AMMONIA " in the last 168 hours. Coagulation Profile: Recent Labs  Lab 08/15/23 2129  INR 1.4*   Cardiac Enzymes: No results for input(s): "CKTOTAL", "CKMB", "CKMBINDEX", "TROPONINI" in the last 168 hours. BNP (last 3 results) No results for input(s): "PROBNP" in the last 8760 hours. HbA1C: No results for input(s): "HGBA1C" in the last 72 hours. CBG: Recent Labs  Lab 08/16/23 0529  08/16/23 0638 08/16/23 0732 08/16/23 0740 08/16/23 0836  GLUCAP 127* 103* 107* 107* 103*   Lipid Profile: No results for input(s): "CHOL", "HDL", "LDLCALC", "TRIG", "CHOLHDL", "LDLDIRECT" in the last 72 hours. Thyroid  Function Tests: No results for input(s): "TSH", "T4TOTAL", "FREET4", "T3FREE", "THYROIDAB" in the last 72 hours. Anemia Panel: No results for input(s): "VITAMINB12", "FOLATE", "FERRITIN", "TIBC", "IRON ", "RETICCTPCT" in the last 72 hours. Sepsis Labs: No results for input(s): "PROCALCITON", "LATICACIDVEN" in the last 168 hours.  Recent Results (from the past 240 hours)  MRSA Next Gen by PCR, Nasal     Status: None   Collection Time: 08/15/23  6:14 PM   Specimen: Nasal Mucosa; Nasal Swab  Result Value Ref Range Status   MRSA by PCR Next Gen NOT DETECTED NOT DETECTED Final    Comment: (NOTE) The GeneXpert MRSA Assay (FDA approved for NASAL specimens only), is one component of a comprehensive MRSA colonization surveillance program. It is not intended to diagnose MRSA infection nor to guide or monitor treatment for MRSA infections. Test performance is not FDA approved in patients less than 41 years old. Performed at [facility], [address] [place], [place] [address]          Radiology Studies: Portable chest x-ray (1 view) Result Date: 08/15/2023 CLINICAL DATA:  [id number] DKA (diabetic ketoacidosis) (HCC) [id number] EXAM: PORTABLE CHEST 1 VIEW COMPARISON:  April 05, 2023 FINDINGS: The cardiomediastinal silhouette is unchanged in contour.Atherosclerotic calcifications. Apices are obscured by overlapping chin. No pleural effusion. No pneumothorax. No acute pleuroparenchymal abnormality. Remote RIGHT-sided rib fractures. IMPRESSION: No acute cardiopulmonary abnormality. Electronically Signed   By: Clancy Crimes M.D.   On: 08/15/2023 17:49        Scheduled Meds:  acyclovir   400 mg Oral BID   Chlorhexidine  Gluconate Cloth  6 each Topical Q0600    gabapentin   300 mg Oral BID   insulin  aspart  0-6 Units Subcutaneous Q4H   [START ON 08/17/2023] insulin  glargine-yfgn  5 Units Subcutaneous Daily   levETIRAcetam   500 mg Oral BID   Continuous Infusions:  dextrose  5% lactated ringers  Stopped (08/16/23 0837)   heparin  750 Units/hr (08/16/23 0841)   insulin  Stopped (08/16/23 4782)   lactated ringers  Stopped (08/16/23 0124)     LOS: 1 day       Alphonsus Jeans, MD [facility] Pager [phone]  If 7PM-7AM, please contact night-coverage [email or web address] 08/16/2023, 8:59 AM

## 2023-08-17 DIAGNOSIS — E1069 Type 1 diabetes mellitus with other specified complication: Secondary | ICD-10-CM

## 2023-08-17 LAB — MULTIPLE MYELOMA PANEL, SERUM
Albumin SerPl Elph-Mcnc: 3.5 g/dL (ref 2.9–4.4)
Albumin/Glob SerPl: 0.8 (ref 0.7–1.7)
Alpha 1: 0.2 g/dL (ref 0.0–0.4)
Alpha2 Glob SerPl Elph-Mcnc: 0.6 g/dL (ref 0.4–1.0)
B-Globulin SerPl Elph-Mcnc: 0.6 g/dL — ABNORMAL LOW (ref 0.7–1.3)
Gamma Glob SerPl Elph-Mcnc: 3 g/dL — ABNORMAL HIGH (ref 0.4–1.8)
Globulin, Total: 4.5 g/dL — ABNORMAL HIGH (ref 2.2–3.9)
IgA: 10 mg/dL — ABNORMAL LOW (ref 61–437)
IgG (Immunoglobin G), Serum: 3688 mg/dL — ABNORMAL HIGH (ref 603–1613)
IgM (Immunoglobulin M), Srm: <5 mg/dL — ABNORMAL LOW (ref 20–172)
M Protein SerPl Elph-Mcnc: 2.7 g/dL — ABNORMAL HIGH
Total Protein ELP: 8 g/dL (ref 6.0–8.5)

## 2023-08-17 LAB — BASIC METABOLIC PANEL WITH GFR
Anion gap: 7 (ref 5–15)
BUN: 39 mg/dL — ABNORMAL HIGH (ref 8–23)
CO2: 28 mmol/L (ref 22–32)
Calcium: 8.6 mg/dL — ABNORMAL LOW (ref 8.9–10.3)
Chloride: 108 mmol/L (ref 98–111)
Creatinine, Ser: 1.44 mg/dL — ABNORMAL HIGH (ref 0.61–1.24)
GFR, Estimated: 52 mL/min — ABNORMAL LOW (ref >60)
Glucose, Bld: 233 mg/dL — ABNORMAL HIGH (ref 70–99)
Potassium: 4 mmol/L (ref 3.5–5.1)
Sodium: 143 mmol/L (ref 135–145)

## 2023-08-17 LAB — CBC
HCT: 27.1 % — ABNORMAL LOW (ref 39.0–52.0)
Hemoglobin: 8.6 g/dL — ABNORMAL LOW (ref 13.0–17.0)
MCH: 30.3 pg (ref 26.0–34.0)
MCHC: 31.7 g/dL (ref 30.0–36.0)
MCV: 95.4 fL (ref 80.0–100.0)
Platelets: 247 10*3/uL (ref 150–400)
RBC: 2.84 MIL/uL — ABNORMAL LOW (ref 4.22–5.81)
RDW: 16.8 % — ABNORMAL HIGH (ref 11.5–15.5)
WBC: 4 10*3/uL (ref 4.0–10.5)
nRBC: 1 % — ABNORMAL HIGH (ref 0.0–0.2)

## 2023-08-17 LAB — GLUCOSE, CAPILLARY
Glucose-Capillary: 101 mg/dL — ABNORMAL HIGH (ref 70–99)
Glucose-Capillary: 131 mg/dL — ABNORMAL HIGH (ref 70–99)
Glucose-Capillary: 169 mg/dL — ABNORMAL HIGH (ref 70–99)
Glucose-Capillary: 198 mg/dL — ABNORMAL HIGH (ref 70–99)
Glucose-Capillary: 225 mg/dL — ABNORMAL HIGH (ref 70–99)
Glucose-Capillary: 288 mg/dL — ABNORMAL HIGH (ref 70–99)
Glucose-Capillary: 58 mg/dL — ABNORMAL LOW (ref 70–99)
Glucose-Capillary: 62 mg/dL — ABNORMAL LOW (ref 70–99)

## 2023-08-17 NOTE — Progress Notes (Signed)
 PROGRESS NOTE   HPI was taken from Dr. Reinhold Carbine: [name] is a 73m with history of neuropathy, GERD, hyperlipidemia, insulin -dependent diabetes mellitus controlled and on insulin  pump, atrial flutter on apixaban , multiple myeloma, who presents emergency department for chief concerns of hyperglycemia.   Vitals in the ED showed t 98.3, rr 14, hr 94, blood pressure 139/85, SpO2 100% on room air.   Serum sodium is 138, potassium 5.9, hemoglobin 1.4, bicarb 9, BUN of 74, serum creatinine of 2.72, EGFR 24.  Anion gap is elevated 25.   VBG 7.14/31/40.   ED treatment: Insulin  via Endo tool, LR 1360 mL bolus, LR infusion at 125 mL/h, sodium chloride  1 L bolus. ------------------------------------ At bedside, patient is able to tell me his first and last name, age, location, current calendar year.   He reports that his insulin  pump was not working for the last couple of days.   He denies being in any pain including chest pain and abdominal pain, nausea, vomiting, diarrhea, dysuria, hematuria, syncope.   Nicholas Weiss  MVH:[id number] DOB: 1950-07-19 DOA: 08/15/2023 PCP: Monique Ano, MD   Assessment & Plan:   Principal Problem:   DKA (diabetic ketoacidosis) (HCC) Active Problems:   AKI (acute kidney injury) (HCC)   Elevated troponin   Uncontrolled type 1 diabetes mellitus with hypoglycemia, with long-term current use of insulin  (HCC)   Hyperkalemia   Protein-calorie malnutrition, severe   Hypertension   Dyslipidemia   Multiple myeloma not having achieved remission (HCC)   Hypophosphatemia   High anion gap metabolic acidosis  Assessment and Plan: DKA: resolved. Likely secondary to poorly controlled DM  DM1: likely poorly controlled. Continue on glargine, SSI w/ accuchecks. Pt should NOT be d/c w/ insulin  pump as pt and pt's significant other needs more education. Continue on sq insulin  at d/c     Elevated troponin: likely secondary to demand ischemia. D/c IV heparin  drip.    AKI: Cr is trending down again today. Avoid nephrotoxic meds    Hyperkalemia: resolved    High anion gap metabolic acidosis: resolved   Hypophosphatemia: resolved    Multiple myeloma:  not having achieved remission. Holding home dose of pomalidomide    Memory deficits: as per pt. Re-orient prn  Moderate protein calorie malnutrition: continue w/ nutritional supplement   Generalized weakness: PT/OT consulted    DVT prophylaxis: eliquis  Code Status: full  Family Communication: called pt's girlfriend, [name], but no answer & unable to leave a voicemail  Disposition Plan: likely d/c back home w/ HH  Level of care: Med-Surg    Consultants:    Procedures:   Antimicrobials:   Subjective: Pt c/o fatigue   Objective: Vitals:   08/16/23 1900 08/16/23 2050 08/17/23 0432 08/17/23 0805  BP: (!) 150/94 (!) 150/94 (!) 146/88 (!) 178/105  Pulse: 82  79 72  Resp: 17 16 17 17   Temp: 98.8 F (37.1 C) 98.3 F (36.8 C) 98.3 F (36.8 C) 97.7 F (36.5 C)  TempSrc: Oral  Oral   SpO2: 100% 100% 100% 100%  Weight:      Height:        Intake/Output Summary (Last 24 hours) at 08/17/2023 0808 Last data filed at 08/17/2023 0615 Gross per 24 hour  Intake 83.34 ml  Output 1900 ml  Net -1816.66 ml   Filed Weights   08/15/23 1518 08/15/23 1829  Weight: 68 kg 63.7 kg    Examination:  General exam: Appears comfortable but pleasantly confused Respiratory system: clear breath sounds b/l Cardiovascular  system: S1/S2+. No rubs or clicks Gastrointestinal system: Abd is soft, NT, ND & hypoactive bowel sounds  Central nervous system: Alert and oriented x self only.  Psychiatry: Judgement and insight appears poor. Flat mood and affect    Data Reviewed: I have personally reviewed following labs and imaging studies  CBC: Recent Labs  Lab 08/13/23 0941 08/15/23 1524 08/16/23 0431  WBC 2.8* 4.5 3.7*  NEUTROABS 1.2*  --   --   HGB 7.5* 10.3* 7.6*  HCT 23.4* 37.3* 23.4*  MCV  95.5 112.3* 95.1  PLT 189 230 241   Basic Metabolic Panel: Recent Labs  Lab 08/13/23 0941 08/15/23 1524 08/15/23 1724 08/15/23 1948 08/15/23 2349 08/16/23 0431  NA 135 138  --  144 143 144  K 4.2 5.9*  --  4.4 3.3* 3.9  CL 103 104  --  111 113* 114*  CO2 24 9*  --  14* 22 23  GLUCOSE 149* 814*  --  663* 350* 122*  BUN 32* 74*  --  71* 66* 61*  CREATININE 1.45* 2.72*  --  2.63* 2.36* 2.04*  CALCIUM  8.0* 8.8*  --  8.3* 8.2* 8.3*  MG  --   --  2.3  --   --   --   PHOS  --   --  5.8*  --   --   --    GFR: Estimated Creatinine Clearance: 30.4 mL/min (A) (by C-G formula based on SCr of 2.04 mg/dL (H)). Liver Function Tests: Recent Labs  Lab 08/13/23 0941  AST 27  ALT 39  ALKPHOS 58  BILITOT 0.5  PROT 7.6  ALBUMIN 3.3*   No results for input(s): "LIPASE", "AMYLASE" in the last 168 hours. No results for input(s): "AMMONIA " in the last 168 hours. Coagulation Profile: Recent Labs  Lab 08/15/23 2129  INR 1.4*   Cardiac Enzymes: No results for input(s): "CKTOTAL", "CKMB", "CKMBINDEX", "TROPONINI" in the last 168 hours. BNP (last 3 results) No results for input(s): "PROBNP" in the last 8760 hours. HbA1C: No results for input(s): "HGBA1C" in the last 72 hours. CBG: Recent Labs  Lab 08/16/23 1624 08/16/23 2028 08/16/23 2351 08/17/23 0435 08/17/23 0758  GLUCAP 209* 165* 100* 169* 198*   Lipid Profile: No results for input(s): "CHOL", "HDL", "LDLCALC", "TRIG", "CHOLHDL", "LDLDIRECT" in the last 72 hours. Thyroid  Function Tests: No results for input(s): "TSH", "T4TOTAL", "FREET4", "T3FREE", "THYROIDAB" in the last 72 hours. Anemia Panel: No results for input(s): "VITAMINB12", "FOLATE", "FERRITIN", "TIBC", "IRON ", "RETICCTPCT" in the last 72 hours. Sepsis Labs: No results for input(s): "PROCALCITON", "LATICACIDVEN" in the last 168 hours.  Recent Results (from the past 240 hours)  MRSA Next Gen by PCR, Nasal     Status: None   Collection Time: 08/15/23  6:14 PM    Specimen: Nasal Mucosa; Nasal Swab  Result Value Ref Range Status   MRSA by PCR Next Gen NOT DETECTED NOT DETECTED Final    Comment: (NOTE) The GeneXpert MRSA Assay (FDA approved for NASAL specimens only), is one component of a comprehensive MRSA colonization surveillance program. It is not intended to diagnose MRSA infection nor to guide or monitor treatment for MRSA infections. Test performance is not FDA approved in patients less than 25 years old. Performed at [facility], [address] [place], [place] [address]          Radiology Studies: Portable chest x-ray (1 view) Result Date: 08/15/2023 CLINICAL DATA:  [id number] DKA (diabetic ketoacidosis) (HCC) [id number] EXAM: PORTABLE CHEST 1  VIEW COMPARISON:  April 05, 2023 FINDINGS: The cardiomediastinal silhouette is unchanged in contour.Atherosclerotic calcifications. Apices are obscured by overlapping chin. No pleural effusion. No pneumothorax. No acute pleuroparenchymal abnormality. Remote RIGHT-sided rib fractures. IMPRESSION: No acute cardiopulmonary abnormality. Electronically Signed   By: Clancy Crimes M.D.   On: 08/15/2023 17:49        Scheduled Meds:  acyclovir   400 mg Oral BID   apixaban   5 mg Oral BID   feeding supplement (GLUCERNA SHAKE)  237 mL Oral TID BM   gabapentin   300 mg Oral BID   insulin  aspart  0-6 Units Subcutaneous Q4H   insulin  glargine-yfgn  5 Units Subcutaneous Daily   levETIRAcetam   500 mg Oral BID   multivitamin with minerals  1 tablet Oral Daily   Continuous Infusions:     LOS: 2 days       Alphonsus Jeans, MD [facility] Pager [phone]  If 7PM-7AM, please contact night-coverage [email or web address] 08/17/2023, 8:08 AM

## 2023-08-17 NOTE — Progress Notes (Signed)
 Occupational Therapy Evaluation Patient Details Name: Nicholas Weiss MRN: [id number] DOB: 1950/09/05 Today's Date: 08/17/2023   History of Present Illness   Pt admitted for hyperglycemia and DKA. HIstory includes neuropathy, GERD, DM, dysautonomia, and Afib.     Clinical Impressions Nicholas Weiss was seen for OT evaluation this date. Prior to hospital admission, pt was MOD I in ADLs. Pt lives with wife. Pt presents to acute OT demonstrating impaired ADL performance and functional mobility 2/2 decreased activity tolerance. (See OT problem list for additional functional deficits). Pt currently requires MOD I for socks, MIN A for UB dressing and abdominal binder. Orthostatic readings performed during session with TED hose and abdominal binder donned (see table below), pt denied any symptoms. Pt educated on use of abdominal binder, pt remains fall risk. Pt would benefit from skilled OT services to address noted impairments and functional limitations (see below for any additional details) in order to maximize safety and independence while minimizing falls risk and caregiver burden. Anticipate the need for follow up OT services upon acute hospital DC.   Orthostatic VS for the past 24 hrs:  BP- Lying Pulse- Lying BP- Sitting Pulse- Sitting BP- Standing at 0 minutes Pulse- Standing at 0 minutes  08/17/23 1440 (!) 143/92 79 119/80 86 (!) 86/67 98        If plan is discharge home, recommend the following:   A little help with walking and/or transfers;A little help with bathing/dressing/bathroom;Assistance with cooking/housework;Assist for transportation     Functional Status Assessment   Patient has had a recent decline in their functional status and demonstrates the ability to make significant improvements in function in a reasonable and predictable amount of time.     Equipment Recommendations   BSC/3in1     Recommendations for Other Services         Precautions/Restrictions    Precautions Precautions: Fall Recall of Precautions/Restrictions: Impaired Restrictions Weight Bearing Restrictions Per Provider Order: No     Mobility Bed Mobility Overal bed mobility: Needs Assistance Bed Mobility: Supine to Sit, Sit to Supine     Supine to sit: Supervision Sit to supine: Supervision, Used rails        Transfers Overall transfer level: Needs assistance Equipment used: Rolling walker (2 wheels) Transfers: Sit to/from Stand Sit to Stand: Contact guard assist                  Balance Overall balance assessment: Needs assistance Sitting-balance support: Feet supported Sitting balance-Leahy Scale: Fair     Standing balance support: Bilateral upper extremity supported, Reliant on assistive device for balance Standing balance-Leahy Scale: Fair                             ADL either performed or assessed with clinical judgement   ADL Overall ADL's : Needs assistance/impaired                                       General ADL Comments: MOD I sitting for sonning/doffing socks, MIN A for UB dressing and abdominal binder, ambulated + RW 15 ft     Vision         Perception         Praxis         Pertinent Vitals/Pain Pain Assessment Pain Assessment: No/denies pain     Extremity/Trunk Assessment Upper Extremity  Assessment Upper Extremity Assessment: Overall WFL for tasks assessed   Lower Extremity Assessment Lower Extremity Assessment: Overall WFL for tasks assessed       Communication Communication Communication: No apparent difficulties   Cognition Arousal: Alert Behavior During Therapy: WFL for tasks assessed/performed Cognition: No family/caregiver present to determine baseline             OT - Cognition Comments: tangential and disoriented to situation                 Following commands: Intact Following commands impaired: Follows one step commands with increased time      Cueing  General Comments   Cueing Techniques: Verbal cues;Gestural cues  HR up to 114bpm with exertion   Exercises     Shoulder Instructions      Home Living Family/patient expects to be discharged to:: Private residence Living Arrangements: Spouse/significant other;Children Available Help at Discharge: Family;Available 24 hours/day Type of Home: House Home Access: Stairs to enter [employer] of Steps: 1 Entrance Stairs-Rails: Can reach both Home Layout: One level     Bathroom Shower/Tub: Producer, television/film/video: Standard     Home Equipment: Agricultural consultant (2 wheels);Cane - quad;Shower seat;Grab bars - toilet;Grab bars - tub/shower;Cane - single point   Additional Comments: Pt reports he uses a regular wooden cane;      Prior Functioning/Environment Prior Level of Function : Needs assist;History of Falls (last six months)             Mobility Comments: pt reports he walks with his cane ADLs Comments: pt reports his wife assists with ADLs/IADLs, significant other does the cooking; Pt is not currently driving;    OT Problem List: Decreased activity tolerance;Impaired balance (sitting and/or standing)   OT Treatment/Interventions: Self-care/ADL training;Energy conservation;Patient/family education      OT Goals(Current goals can be found in the care plan section)   Acute Rehab OT Goals Patient Stated Goal: to go home OT Goal Formulation: With patient Time For Goal Achievement: 08/31/23 Potential to Achieve Goals: Good ADL Goals Pt Will Perform Grooming: with modified independence;standing Pt Will Perform Lower Body Dressing: with modified independence;sit to/from stand;bed level Pt Will Transfer to Toilet: with modified independence;ambulating;regular height toilet   OT Frequency:  Min 2X/week    Co-evaluation              AM-PAC OT "6 Clicks" Daily Activity     Outcome Measure Help from another person eating meals?:  None Help from another person taking care of personal grooming?: A Little Help from another person toileting, which includes using toliet, bedpan, or urinal?: A Little Help from another person bathing (including washing, rinsing, drying)?: A Little Help from another person to put on and taking off regular upper body clothing?: A Little Help from another person to put on and taking off regular lower body clothing?: A Little 6 Click Score: 19   End of Session Equipment Utilized During Treatment: Rolling walker (2 wheels)  Activity Tolerance: Patient tolerated treatment well;Treatment limited secondary to medical complications (Comment) Patient left: in bed;with call bell/phone within reach;with bed alarm set  OT Visit Diagnosis: Unsteadiness on feet (R26.81);Other abnormalities of gait and mobility (R26.89)                Time: 1610-9604 OT Time Calculation (min): 22 min Charges:  OT General Charges $OT Visit: 1 Visit OT Evaluation $OT Eval Low Complexity: 1 Low OT Treatments $Self Care/Home Management : 8-22 mins  Stevenson Elbe, Student OT   [employer] 08/17/2023, 2:56 PM

## 2023-08-17 NOTE — Inpatient Diabetes Management (Addendum)
 Inpatient Diabetes Program Recommendations  AACE/ADA: New Consensus Statement on Inpatient Glycemic Control (2015)  Target Ranges:  Prepandial:   less than 140 mg/dL      Peak postprandial:   less than 180 mg/dL (1-2 hours)      Critically ill patients:  140 - 180 mg/dL   Lab Results  Component Value Date   GLUCAP 198 (H) 08/17/2023   HGBA1C 10.0 (H) 07/21/2023    Review of Glycemic Control  Latest Reference Range & Units 08/16/23 08:36 08/16/23 11:23 08/16/23 16:24 08/16/23 20:28 08/16/23 23:51 08/17/23 04:35 08/17/23 07:58  Glucose-Capillary 70 - 99 mg/dL 161 (H) 096 (H) 045 (H) 165 (H) 100 (H) 169 (H) 198 (H)   Diabetes history: DM 2 Outpatient Diabetes medications:  Dexcom G7 sensor plus T-slim insulin  pump Insulin  pump at home (T-slim) Basal rate 12am 0.25 units/hr (24hr = 8 units) Bolus settings ISF 60 I:C ratio 20 Target 120  (Off pump Tresiba  5 units/ plus Novolog ) Current orders for Inpatient glycemic control:  Semglee  5 units daily Novolog  0-6 units q 4 hours  Inpatient Diabetes Program Recommendations:    Spoke with significant other on the phone regarding pump issues.  She states that blood sugars went up and she checked that insulin  was going in site, however blood sugar remained high.  Therefore they disconnected pump and started administering Novolog . We discussed site issues and that when blood sugar is increasing and not coming down with pump, they should give SQ insulin  and change site.   I explained that Novolog  is short acting and only lasts 3-4 hours.  He therefore needs both basal and bolus insulin  if pump is not working.  We also discussed that they likely need more training for pump.  I recommend SQ basal/bolus regimen until they can be seen by pump trainer again.  [name] states that they have both basal and bolus insulins at home.  I recommended that they use new pen for Tresiba  and Novolog  once home. Also called Dr. Arnette Bias office to let them know that  patient readmitted with DKA.   Thanks,  Josefa Ni, RN, BC-ADM Inpatient Diabetes Coordinator Pager [phone]  (8a-5p)

## 2023-08-17 NOTE — Plan of Care (Signed)
  Problem: Education: Goal: Ability to describe self-care measures that may prevent or decrease complications (Diabetes Survival Skills Education) will improve Outcome: Progressing   Problem: Coping: Goal: Ability to adjust to condition or change in health will improve Outcome: Progressing   Problem: Health Behavior/Discharge Planning: Goal: Ability to identify and utilize available resources and services will improve Outcome: Progressing   Problem: Metabolic: Goal: Ability to maintain appropriate glucose levels will improve Outcome: Progressing   Problem: Nutritional: Goal: Maintenance of adequate nutrition will improve Outcome: Progressing   Problem: Cardiac: Goal: Ability to maintain an adequate cardiac output will improve Outcome: Progressing

## 2023-08-17 NOTE — Evaluation (Signed)
 Physical Therapy Evaluation Patient Details Name: Nicholas Weiss MRN: [id number] DOB: 09-Aug-1950 Today's Date: 08/17/2023  History of Present Illness  Pt admitted for hyperglycemia and DKA. HIstory includes neuropathy, GERD, DM, dysautonomia, and Afib.  Clinical Impression  Pt is a pleasant 73 year old male who was admitted for DKA and complaints of hyperglycemia. Pt performs bed mobility with supervision, transfers with cga, and ambulation with cga and RW. Pt reports he typically uses SPC, not in room at this time. Used RW and pt needs cues for sequencing and safe use of AD. Recommend continued use of RW at this time due to impairments. Pt demonstrates deficits with strength/balance/mobility. During ambulation, pt reports not feeling well and noted episode of presyncope. RN to assist with chair and seated rest break required. Pt then able to ambulate back to room. Would recommend formal orthostatic assessment and use of non-pharmalogic interventions including abdominal binder/TED hose. Would benefit from skilled PT to address above deficits and promote optimal return to PLOF. Pt will continue to receive skilled PT services while admitted and will defer to TOC/care team for updates regarding disposition planning.         If plan is discharge home, recommend the following: A little help with walking and/or transfers;A little help with bathing/dressing/bathroom;Assistance with cooking/housework;Assist for transportation;Help with stairs or ramp for entrance;Direct supervision/assist for medications management   Can travel by private vehicle        Equipment Recommendations None recommended by PT  Recommendations for Other Services       Functional Status Assessment Patient has had a recent decline in their functional status and demonstrates the ability to make significant improvements in function in a reasonable and predictable amount of time.     Precautions / Restrictions  Precautions Precautions: Fall Recall of Precautions/Restrictions: Impaired Precaution/Restrictions Comments: reports history of multiple falls Restrictions Weight Bearing Restrictions Per Provider Order: No      Mobility  Bed Mobility Overal bed mobility: Needs Assistance Bed Mobility: Supine to Sit, Sit to Supine     Supine to sit: Supervision     General bed mobility comments: able to follow commands. No dizziness with exertion    Transfers Overall transfer level: Needs assistance Equipment used: Rolling walker (2 wheels) Transfers: Sit to/from Stand Sit to Stand: Contact guard assist           General transfer comment: cues for hand placement    Ambulation/Gait Ambulation/Gait assistance: Contact guard assist Gait Distance (Feet): 75 Feet Assistive device: Rolling walker (2 wheels) Gait Pattern/deviations: Step-through pattern       General Gait Details: ambulated in hallway. Needs cues for sequencing. 1 bout of pre syncope requiring chair needed. Pt then able to ambulate back to room. Unsafe use of RW  Stairs            Wheelchair Mobility     Tilt Bed    Modified Rankin (Stroke Patients Only)       Balance Overall balance assessment: Needs assistance Sitting-balance support: Feet supported Sitting balance-Leahy Scale: Fair     Standing balance support: During functional activity, Reliant on assistive device for balance, Single extremity supported Standing balance-Leahy Scale: Poor                               Pertinent Vitals/Pain Pain Assessment Pain Assessment: No/denies pain    Home Living Family/patient expects to be discharged to:: Private residence Living Arrangements: Spouse/significant  other;Children Available Help at Discharge: Family;Available 24 hours/day Type of Home: House Home Access: Stairs to enter Entrance Stairs-Rails: Can reach both Entrance Stairs-Number of Steps: 1   Home Layout: One  level Home Equipment: Agricultural consultant (2 wheels);Cane - quad;Shower seat;Grab bars - toilet;Grab bars - tub/shower;Cane - single point Additional Comments: Pt reports he uses a regular wooden cane;    Prior Function Prior Level of Function : Needs assist;History of Falls (last six months)             Mobility Comments: pt reports he walks with his cane ADLs Comments: pt reports his wife assists with ADLs/IADLs, significant other does the cooking; Pt is not currently driving;     Extremity/Trunk Assessment   Upper Extremity Assessment Upper Extremity Assessment: Overall WFL for tasks assessed    Lower Extremity Assessment Lower Extremity Assessment: Generalized weakness       Communication   Communication Communication: No apparent difficulties    Cognition Arousal: Alert Behavior During Therapy: Flat affect   PT - Cognitive impairments: History of cognitive impairments                       PT - Cognition Comments: poor historian. Able to state he is in the hospital, however confused on situation and how long he has been admitted Following commands: Intact       Cueing Cueing Techniques: Verbal cues, Gestural cues, Tactile cues, Visual cues     General Comments General comments (skin integrity, edema, etc.): HR up to 114bpm with exertion    Exercises     Assessment/Plan    PT Assessment Patient needs continued PT services  PT Problem List Decreased strength;Decreased activity tolerance;Decreased balance;Decreased mobility;Decreased cognition;Decreased knowledge of use of DME;Decreased safety awareness       PT Treatment Interventions DME instruction;Balance training;Gait training;Stair training;Functional mobility training;Patient/family education;Therapeutic activities;Therapeutic exercise    PT Goals (Current goals can be found in the Care Plan section)  Acute Rehab PT Goals Patient Stated Goal: to go home. PT Goal Formulation: With  patient Time For Goal Achievement: 08/31/23 Potential to Achieve Goals: Fair    Frequency Min 2X/week     Co-evaluation               AM-PAC PT "6 Clicks" Mobility  Outcome Measure Help needed turning from your back to your side while in a flat bed without using bedrails?: None Help needed moving from lying on your back to sitting on the side of a flat bed without using bedrails?: None Help needed moving to and from a bed to a chair (including a wheelchair)?: A Little Help needed standing up from a chair using your arms (e.g., wheelchair or bedside chair)?: A Little Help needed to walk in hospital room?: A Little Help needed climbing 3-5 steps with a railing? : A Lot 6 Click Score: 19    End of Session Equipment Utilized During Treatment: Gait belt Activity Tolerance: Patient tolerated treatment well Patient left: in chair;with chair alarm set Nurse Communication: Mobility status PT Visit Diagnosis: Unsteadiness on feet (R26.81);Repeated falls (R29.6);Muscle weakness (generalized) (M62.81);Difficulty in walking, not elsewhere classified (R26.2)    Time: 1005-1020 PT Time Calculation (min) (ACUTE ONLY): 15 min   Charges:   PT Evaluation $PT Eval Low Complexity: 1 Low   PT General Charges $$ ACUTE PT VISIT: 1 Visit         Amparo Balk, PT, DPT, GCS [phone]   Nicholas Weiss 08/17/2023, 11:13 AM

## 2023-08-18 DIAGNOSIS — G3184 Mild cognitive impairment, so stated: Secondary | ICD-10-CM | POA: Diagnosis present

## 2023-08-18 DIAGNOSIS — E1010 Type 1 diabetes mellitus with ketoacidosis without coma: Secondary | ICD-10-CM | POA: Diagnosis not present

## 2023-08-18 LAB — CBC
HCT: 24.1 % — ABNORMAL LOW (ref 39.0–52.0)
Hemoglobin: 7.7 g/dL — ABNORMAL LOW (ref 13.0–17.0)
MCH: 30.6 pg (ref 26.0–34.0)
MCHC: 32 g/dL (ref 30.0–36.0)
MCV: 95.6 fL (ref 80.0–100.0)
Platelets: 206 10*3/uL (ref 150–400)
RBC: 2.52 MIL/uL — ABNORMAL LOW (ref 4.22–5.81)
RDW: 16.5 % — ABNORMAL HIGH (ref 11.5–15.5)
WBC: 2.7 10*3/uL — ABNORMAL LOW (ref 4.0–10.5)
nRBC: 0.7 % — ABNORMAL HIGH (ref 0.0–0.2)

## 2023-08-18 LAB — BASIC METABOLIC PANEL WITH GFR
Anion gap: 6 (ref 5–15)
BUN: 32 mg/dL — ABNORMAL HIGH (ref 8–23)
CO2: 27 mmol/L (ref 22–32)
Calcium: 8.4 mg/dL — ABNORMAL LOW (ref 8.9–10.3)
Chloride: 109 mmol/L (ref 98–111)
Creatinine, Ser: 1.21 mg/dL (ref 0.61–1.24)
GFR, Estimated: >60 mL/min (ref >60)
Glucose, Bld: 173 mg/dL — ABNORMAL HIGH (ref 70–99)
Potassium: 4.2 mmol/L (ref 3.5–5.1)
Sodium: 142 mmol/L (ref 135–145)

## 2023-08-18 LAB — GLUCOSE, CAPILLARY
Glucose-Capillary: 177 mg/dL — ABNORMAL HIGH (ref 70–99)
Glucose-Capillary: 108 mg/dL — ABNORMAL HIGH (ref 70–99)
Glucose-Capillary: 338 mg/dL — ABNORMAL HIGH (ref 70–99)
Glucose-Capillary: 291 mg/dL — ABNORMAL HIGH (ref 70–99)
Glucose-Capillary: 124 mg/dL — ABNORMAL HIGH (ref 70–99)

## 2023-08-18 MED ORDER — ORAL CARE MOUTH RINSE: 15.0000 mL | OROMUCOSAL | Status: DC | PRN

## 2023-08-18 NOTE — Care Management Important Message (Signed)
 Important Message  Patient Details  Name: Nicholas Weiss MRN: [id number] Date of Birth: 03-May-1950   Important Message Given:  Yes - Medicare IM     Masai Kidd W, CMA 08/18/2023, 1:23 PM

## 2023-08-18 NOTE — Progress Notes (Signed)
 Progress Note   Patient: Nicholas Weiss ZOX:[id number] DOB: [birth year] DOA: 08/15/2023     3 DOS: the patient was seen and examined on 08/18/2023   Brief hospital course: 72yo with h/o T1DM on insulin  pump with neuropathy, GERD, HLD, aflutter on Eliquis , and multiple myeloma who presented on 5/18 with hyperglycemia and was found to be in DKA.  He was started on Endotool and DKA has resolved.  His significant other has concerns about his stability for home and prefers SNF rehab first if possible.  Assessment and Plan:  DKA Resolved Likely secondary to poorly controlled DM in conjunction with cognitive impairment Insulin  pump may not be ideal in this scenario   DM1 A1c 10, poorly controlled Insulin  pump does not appear to be ideal in this scenario Continue on glargine, SSI w/ accuchecks   Elevated troponin Likely secondary to demand ischemia, no chest pain  D/c IV heparin  drip   AKI Developed due to dehydration in the setting of DKA Resolved, renal function is back to baseline Avoid nephrotoxic medications   Multiple myeloma Not having achieved remission Holding home dose of pomalidomide   Followed by Dr. Randy Buttery On ASA and Acyclovir  prophylaxis  Anemia Baseline Hgb 9-10, currently 7.7 but stable On EPO injections   MCI Impulsivity noted by nursing staff Partner has concerns about her ability to care for him at home, recurrent falls   Moderate protein calorie malnutrition Nutrition Problem: Moderate Malnutrition Etiology: chronic illness (multiple myeloma) Signs/Symptoms: mild fat depletion, mild muscle depletion, moderate muscle depletion, percent weight loss Interventions: Glucerna shake, MVI   Generalized weakness PT/OT consulted  Recommended for home health PT/OT Partner is concerned that he may need SNF rehab         Consultants: PT OT TOC team   Procedures: None   Antibiotics: None   30 Day Unplanned Readmission Risk Score    Flowsheet Row ED to  Hosp-Admission (Current) from 08/15/2023 in [facility] (1A)  30 Day Unplanned Readmission Risk Score (%) 72.14 Filed at 08/18/2023 1200       This score is the patient's risk of an unplanned readmission within 30 days of being discharged (0 -100%). The score is based on dignosis, age, lab data, medications, orders, and past utilization.   Low:  0-14.9   Medium: 15-21.9   High: 22-29.9   Extreme: 30 and above           Subjective: He reports feeling well and would like to go home.  Nurses report that he is still very impulsive.   I spoke with his partner, Cyndy Dresser.  He lives at home with him.  He is sometimes confused.  Some days he does well and is independent, other days he falls and is confused and uses the bathroom on himself.  She has been saying she needs help - either a facility until he gets better or some home health for 3-4 hours a day.  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  Medicaid will give 80 hours a month to help them, 2 hours and 45 minutes per day for 7 days.  They are also planning to put him on the CAP list for services up to 8 hours a day.   Objective: Vitals:   08/18/23 0411 08/18/23 0716  BP: (!) 171/98 (!) 150/89  Pulse: 77 69  Resp: 18 18  Temp: 98.7 F (37.1 C) 98  F (36.7 C)  SpO2: 100% 100%    Intake/Output Summary (Last 24 hours) at 08/18/2023 1412 Last data filed at 08/18/2023 4098 Gross per 24 hour  Intake 0 ml  Output 550 ml  Net -550 ml   Filed Weights   08/15/23 1518 08/15/23 1829  Weight: 68 kg 63.7 kg    Exam:  General:  Appears calm and comfortable and is in NAD, sitting on edge of the bed with bed alarm blaring Eyes:  normal lids, iris ENT:  grossly normal hearing, lips & tongue, mmm Cardiovascular:  RRR, no m/r/g. No LE edema.  Respiratory:   CTA bilaterally with no wheezes/rales/rhonchi.  Normal respiratory  effort. Abdomen:  soft, NT, ND Skin:  no rash or induration seen on limited exam Musculoskeletal:  grossly normal tone BUE/BLE, good ROM, no bony abnormality Psychiatric:  pleasant but confused mood and affect, speech fluent and appropriate, AOx1-2 Neurologic:  CN 2-12 grossly intact, moves all extremities in coordinated fashion  Data Reviewed: I have reviewed the patient's lab results since admission.  Pertinent labs for today include:   Glucose 173 BUN 32/Creatinine 1.21/GFR >60 WBC 2.7 Hgb 7.7, stable     Family Communication: None present; I spoke with his significant other by telephone  Disposition: Status is: Inpatient Remains inpatient appropriate because: unsafe disposition     Time spent: 50 minutes  Unresulted Labs (From admission, onward)    None        Author: Lorita Rosa, MD 08/18/2023 2:12 PM  For on call review [email or web address].

## 2023-08-18 NOTE — Progress Notes (Signed)
 Nutrition Follow-up  DOCUMENTATION CODES:   Non-severe (moderate) malnutrition in context of chronic illness  INTERVENTION:   -Continue dysphagia 3 diet -Continue MVI with minerals daily -Continue Glucerna Shake po TID, each supplement provides 220 kcal and 10 grams of protein  -Magic cup TID with meals, each supplement provides 290 kcal and 9 grams of protein   NUTRITION DIAGNOSIS:   Moderate Malnutrition related to chronic illness (multiple myeloma) as evidenced by mild fat depletion, mild muscle depletion, moderate muscle depletion, percent weight loss.  Ongoing  GOAL:   Patient will meet greater than or equal to 90% of their needs  Progressing   MONITOR:   PO intake, Supplement acceptance  REASON FOR ASSESSMENT:   Consult Assessment of nutrition requirement/status  ASSESSMENT:   Pt with history of neuropathy, GERD, hyperlipidemia, insulin -dependent diabetes mellitus controlled and on insulin  pump, atrial flutter on apixaban , multiple myeloma, who presents for chief concerns of hyperglycemia.  Reviewed I/O's: +270 ml x 24 hours and +3.1 L since admission  UOP: 250 ml x 24 hours   Pt sleeping soundly at time of visit. He did not respond to voice. Noted Glucerna shake on tray table. Meal completions 0-50%.   No new wt since last visit.   Per DM coordinator, recommending subcutaneous insulin  until pt and caregiver can receive more training on insulin  pump. Endocrinologist aware.   Per MD notes, plan for SNF placement vs CAP services.   Medications reviewed and include neurontin  and keppra .   Labs reviewed: CBGS: 58-338 (inpatient orders for glycemic control are 0-6 units insulin  aspart every 4 hours and 5 units insulin  glargine-yfgn daily). Noted DM coordinator recommendations for correction of 0-6 units novolog  to TID with meals and HS and increase long acting insulin  to 6 units daily.     Diet Order:   Diet Order             DIET DYS 3 Room service  appropriate? Yes; Fluid consistency: Thin  Diet effective now                   EDUCATION NEEDS:   Education needs have been addressed  Skin:  Skin Assessment: Reviewed RN Assessment  Last BM:  08/17/23 (type 6)  Height:   Ht Readings from Last 1 Encounters:  08/15/23 5\' 10"  (1.778 m)    Weight:   Wt Readings from Last 1 Encounters:  08/15/23 63.7 kg    Ideal Body Weight:  75.5 kg  BMI:  Body mass index is 20.15 kg/m.  Estimated Nutritional Needs:   Kcal:  1900-2100  Protein:  90-105 grams  Fluid:  1.9-2.1 L    Herschel Lords, RD, LDN, CDCES Registered Dietitian III Certified Diabetes Care and Education Specialist If unable to reach this RD, please use "RD Inpatient" group chat on secure chat between hours of 8am-4 pm daily

## 2023-08-18 NOTE — Plan of Care (Signed)

## 2023-08-18 NOTE — Inpatient Diabetes Management (Signed)
 Inpatient Diabetes Program Recommendations  AACE/ADA: New Consensus Statement on Inpatient Glycemic Control (2015)  Target Ranges:  Prepandial:   less than 140 mg/dL      Peak postprandial:   less than 180 mg/dL (1-2 hours)      Critically ill patients:  140 - 180 mg/dL   Lab Results  Component Value Date   GLUCAP 108 (H) 08/18/2023   HGBA1C 10.0 (H) 07/21/2023    Review of Glycemic Control  Latest Reference Range & Units 08/17/23 16:19 08/17/23 20:15 08/17/23 21:13 08/17/23 21:41 08/17/23 23:36 08/18/23 04:11 08/18/23 07:17  Glucose-Capillary 70 - 99 mg/dL 409 (H) 58 (L) 62 (L) 811 (H) 131 (H) 177 (H) 108 (H)   Diabetes history: DM 2 Outpatient Diabetes medications:  Dexcom G7 sensor plus T-slim insulin  pump Insulin  pump at home (T-slim) Basal rate 12am 0.25 units/hr (24hr = 8 units) Bolus settings ISF 60 I:C ratio 20 Target 120  (Off pump Tresiba  5 units/ plus Novolog ) Current orders for Inpatient glycemic control:  Semglee  5 units daily Novolog  0-6 units q 4 hours Inpatient Diabetes Program Recommendations:   Consider changing Novolog  correction (0-6 units) to tid with meals and HS. Also consider increasing Semglee  to 6 units daily.   Thanks,  Josefa Ni, RN, BC-ADM Inpatient Diabetes Coordinator Pager [phone]  (8a-5p)

## 2023-08-18 NOTE — Progress Notes (Addendum)
 PT Cancellation Note  Patient Details Name: Nicholas Weiss MRN: [id number] DOB: Sep 06, 1950   Cancelled Treatment:     PT attempt. Pt had blankets covering head upon arrival. He is alert and conversational however requested author return in ~ 1 hour. Will return as requested.   Returned ~ 1:45-2 hours after and pt remained unwilling. Acute PT will continue to follow and progress per current POC.   Koleen Perna 08/18/2023, 1:32 PM

## 2023-08-19 DIAGNOSIS — E1010 Type 1 diabetes mellitus with ketoacidosis without coma: Secondary | ICD-10-CM | POA: Diagnosis not present

## 2023-08-19 LAB — BASIC METABOLIC PANEL WITH GFR
Anion gap: 7 (ref 5–15)
BUN: 31 mg/dL — ABNORMAL HIGH (ref 8–23)
CO2: 27 mmol/L (ref 22–32)
Calcium: 8.2 mg/dL — ABNORMAL LOW (ref 8.9–10.3)
Chloride: 103 mmol/L (ref 98–111)
Creatinine, Ser: 1.31 mg/dL — ABNORMAL HIGH (ref 0.61–1.24)
GFR, Estimated: 59 mL/min — ABNORMAL LOW (ref >60)
Glucose, Bld: 144 mg/dL — ABNORMAL HIGH (ref 70–99)
Potassium: 4.5 mmol/L (ref 3.5–5.1)
Sodium: 137 mmol/L (ref 135–145)

## 2023-08-19 LAB — CBC WITH DIFFERENTIAL/PLATELET
Abs Immature Granulocytes: 0.02 10*3/uL (ref 0.00–0.07)
Basophils Absolute: 0 10*3/uL (ref 0.0–0.1)
Basophils Relative: 0 %
Eosinophils Absolute: 0.1 10*3/uL (ref 0.0–0.5)
Eosinophils Relative: 5 %
HCT: 25 % — ABNORMAL LOW (ref 39.0–52.0)
Hemoglobin: 8 g/dL — ABNORMAL LOW (ref 13.0–17.0)
Immature Granulocytes: 1 %
Lymphocytes Relative: 35 %
Lymphs Abs: 1.1 10*3/uL (ref 0.7–4.0)
MCH: 30.5 pg (ref 26.0–34.0)
MCHC: 32 g/dL (ref 30.0–36.0)
MCV: 95.4 fL (ref 80.0–100.0)
Monocytes Absolute: 0.4 10*3/uL (ref 0.1–1.0)
Monocytes Relative: 12 %
Neutro Abs: 1.5 10*3/uL — ABNORMAL LOW (ref 1.7–7.7)
Neutrophils Relative %: 47 %
Platelets: 215 10*3/uL (ref 150–400)
RBC: 2.62 MIL/uL — ABNORMAL LOW (ref 4.22–5.81)
RDW: 16.6 % — ABNORMAL HIGH (ref 11.5–15.5)
WBC: 3 10*3/uL — ABNORMAL LOW (ref 4.0–10.5)
nRBC: 1.3 % — ABNORMAL HIGH (ref 0.0–0.2)

## 2023-08-19 LAB — GLUCOSE, CAPILLARY
Glucose-Capillary: 131 mg/dL — ABNORMAL HIGH (ref 70–99)
Glucose-Capillary: 304 mg/dL — ABNORMAL HIGH (ref 70–99)
Glucose-Capillary: 365 mg/dL — ABNORMAL HIGH (ref 70–99)
Glucose-Capillary: 90 mg/dL (ref 70–99)

## 2023-08-19 MED ORDER — FLUDROCORTISONE ACETATE 0.1 MG PO TABS
0.1000 mg | ORAL_TABLET | Freq: Every day | ORAL | Status: DC
  Filled 2023-08-19 (×2): qty 1

## 2023-08-19 NOTE — TOC Progression Note (Signed)
 Transition of Care [facility]) - Progression Note    Patient Details  Name: Nicholas Weiss MRN: [id number] Date of Birth: [birth year]  Transition of Care [facility]) CM/SW Contact  Alexandra Ice, RN Phone Number: 08/19/2023, 11:58 AM  Clinical Narrative:     [name] she had questions regarding PCS hours. She asked TOC to contact [phone], as she was told if DC planner called it would expedite the PCS assessment. As courtesy TOC contacted the number, and spoke with Candace, she stated TOC was unable to provide or receive information, as TOC was not listed on account. Mr. Edelson would have to call in with [facility] or Cyndy Dresser in order to initiate to get the information needed to [facility] assessment done.  Contacted Cyndy Dresser back and provided the information TOC received when called about PCS assessment.  TOC sent message to [place]  at [facility], and they will accept back if they have a reliable contact number.        Expected Discharge Plan and Services         Expected Discharge Date: 08/19/23                                     Social Determinants of Health (SDOH) Interventions SDOH Screenings   Food Insecurity: No Food Insecurity (07/22/2023)  Housing: Low Risk  (07/22/2023)  Transportation Needs: No Transportation Needs (07/22/2023)  Utilities: Not At Risk (07/22/2023)  Alcohol Screen: Low Risk  (10/02/2020)  Depression (PHQ2-9): Medium Risk (10/02/2020)  Financial Resource Strain: Low Risk  (05/19/2023)   Received from [facility]  Physical Activity: Insufficiently Active (01/01/2020)  Social Connections: Patient Declined (07/22/2023)  Stress: No Stress Concern Present (01/01/2020)  Tobacco Use: Low Risk  (08/15/2023)  Recent Concern: Tobacco Use - Medium Risk (08/13/2023)   Received from [facility]    Readmission Risk Interventions    02/16/2023    3:56 PM 08/24/2022   10:57 AM 05/20/2022   11:41 AM   Readmission Risk Prevention Plan  Transportation Screening Complete Complete Complete  Medication Review Oceanographer) Complete Complete Complete  PCP or Specialist appointment within 3-5 days of discharge Complete Complete Complete  HRI or Home Care Consult Complete Complete Complete  SW Recovery Care/Counseling Consult Not Complete    Palliative Care Screening Not Applicable  Complete  Skilled Nursing Facility Complete Complete Complete

## 2023-08-19 NOTE — Progress Notes (Addendum)
   08/19/23 1146  Therapy Vitals  Patient Position (if appropriate) Orthostatic Vitals  Orthostatic Lying   BP- Lying (!) 164/98  Pulse- Lying 84  Orthostatic Sitting  BP- Sitting (S)  121/81  Pulse- Sitting 91  Orthostatic Standing at 0 minutes  BP- Standing at 0 minutes (S)  100/71  Pulse- Standing at 0 minutes 94  Orthostatic Standing at 3 minutes  BP- Standing at 3 minutes (S)  95/68 (standing x 1 minute)  Pulse- Standing at 3 minutes 99 (standing x 1 minute)    Orthostatic vital signs established during PT session today. Pt asleep upon entry remains somnolent throught. Denies any symptoms related to his hypotension. ABD binder and TEDhose in place; Pt NOT on midodrine  or fludrocortisone  during session.   11:50 AM, 08/19/23 Dawn Eth, PT, DPT Physical Therapist - [facility]  [phone] [facility])

## 2023-08-19 NOTE — Discharge Summary (Signed)
 Physician Discharge Summary   Patient: Nicholas Weiss MRN: [id number] DOB: July 01, 1950  Admit date:     08/15/2023  Discharge date: 08/19/23  Discharge Physician: Lorita Rosa   PCP: Monique Ano, MD   Recommendations at discharge:   You are being discharged with home health physical and occupational therapy Stop insulin  pump and go back to long-acting insulin  injections with sliding scale insulin  at mealtime Follow up with Dr. Randy Buttery from oncology Follow up with Dr. Jerone Moorman in 1-2 weeks You are being referred for outpatient nutrition and diabetes education  Discharge Diagnoses: Principal Problem:   DKA (diabetic ketoacidosis) (HCC) Active Problems:   AKI (acute kidney injury) (HCC)   Elevated troponin   Uncontrolled type 1 diabetes mellitus with hypoglycemia, with long-term current use of insulin  (HCC)   Hypertension   Dyslipidemia   Multiple myeloma not having achieved remission (HCC)   Malnutrition of moderate degree   MCI (mild cognitive impairment) with memory loss    Hospital Course:  DKA Resolved Likely secondary to poorly controlled DM in conjunction with cognitive impairment Insulin  pump is not ideal in this scenario   DM1 A1c 10, poorly controlled Insulin  pump is not ideal in this scenario Continue on glargine, SSI w/ accuchecks Continue gabapentin  Outpatient referrals to nutrition and diabetes education   Elevated troponin Likely secondary to demand ischemia, no chest pain  D/c IV heparin  drip   Afib Rate controlled without medication Continue Eliquis   AKI on stage 3a CKD Developed due to dehydration in the setting of DKA Resolved, renal function is back to baseline Avoid nephrotoxic medications   Multiple myeloma Not having achieved remission Holding home dose of pomalidomide   Followed by Dr. Randy Buttery On ASA and Acyclovir  prophylaxis   Anemia Baseline Hgb 9-10, currently 7.7 but stable On EPO injections  Orthostatic  hypotension Continue fludrocortisone , midodrine , TED hose  Seizure d/o Continue Keppra    MCI Impulsivity noted by nursing staff Partner has concerns about her ability to care for him at home, recurrent falls After discussion, she will use his [employer] Dual Enrollment to set up home health with caregivers and prefers to take him home   Moderate protein calorie malnutrition Nutrition Problem: Moderate Malnutrition Etiology: chronic illness (multiple myeloma) Signs/Symptoms: mild fat depletion, mild muscle depletion, moderate muscle depletion, percent weight loss Interventions: Glucerna shake, MVI   Generalized weakness PT/OT consulted  Recommended for home health PT/OT          Consultants: PT OT TOC team   Procedures: None   Antibiotics: None    Pain control - [place]  Controlled Substance Reporting System database was reviewed. and patient was instructed, not to drive, operate heavy machinery, perform activities at heights, swimming or participation in water activities or provide baby-sitting services while on Pain, Sleep and Anxiety Medications; until their outpatient Physician has advised to do so again. Also recommended to not to take more than prescribed Pain, Sleep and Anxiety Medications.   Disposition: Home Diet recommendation:  Carb modified diet DISCHARGE MEDICATION: Allergies as of 08/19/2023       Reactions   Penicillins Other (See Comments)   Childhood allergy -tolerated amoxil  03/2022 Unknown reaction Has patient had a PCN reaction causing immediate rash, facial/tongue/throat swelling, SOB or lightheadedness with hypotension: No Has patient had a PCN reaction causing severe rash involving mucus membranes or skin necrosis: No Has patient had a PCN reaction that required hospitalization No Has patient had a PCN reaction occurring within the last 10 years: No If all of  the above answers are "NO", then may proceed with Cephalosporin use.         Medication List     STOP taking these medications    insulin  lispro 100 UNIT/ML KwikPen Commonly known as: HUMALOG       TAKE these medications    acetaminophen  500 MG tablet Commonly known as: TYLENOL  Take 1,000 mg by mouth daily as needed for mild pain or moderate pain.   acyclovir  400 MG tablet Commonly known as: ZOVIRAX  Take 1 tablet (400 mg total) by mouth 2 (two) times daily.   apixaban  5 MG Tabs tablet Commonly known as: ELIQUIS  Take 1 tablet (5 mg total) by mouth 2 (two) times daily. Start Eliquis  after 2 weeks which will be 03/02/2023   ascorbic acid  500 MG tablet Commonly known as: VITAMIN C  Take 1 tablet (500 mg total) by mouth daily.   atorvastatin  80 MG tablet Commonly known as: LIPITOR  Take 1 tablet (80 mg total) by mouth at bedtime. Hold while taking Paxlovid    bisacodyl  5 MG EC tablet Commonly known as: bisacodyl  Take 1 tablet (5 mg total) by mouth at bedtime. Skip the dose if no constipation   Dexcom G7 Sensor Misc 1 each by Does not apply route 4 (four) times daily -  before meals and at bedtime.   fludrocortisone  0.1 MG tablet Commonly known as: FLORINEF  Take 1 tablet (0.1 mg total) by mouth daily. Skip the dose if SBP >130   gabapentin  300 MG capsule Commonly known as: NEURONTIN  Take 300 mg by mouth 2 (two) times daily.   Insulin  Aspart FlexPen 100 UNIT/ML Commonly known as: NOVOLOG  Sliding Scale: Glucose 70 - 120: 0 units Glucose 121 - 150: 1 unit Glucose 151 - 200: 2 units Glucose 201 - 250: 3 units Glucose 251 - 300: 5 units Glucose 301 - 350: 7 units Glucose 351 - 400: 9 units Glucose > 400: call your docotr   iron  polysaccharides 150 MG capsule Commonly known as: NIFEREX Take 1 capsule (150 mg total) by mouth daily.   levETIRAcetam  500 MG tablet Commonly known as: KEPPRA  Take 1 tablet by mouth twice daily   multivitamin with minerals Tabs tablet Take 1 tablet by mouth daily.   ondansetron  8 MG tablet Commonly known as:  ZOFRAN  Take 8 mg by mouth daily as needed for nausea or vomiting.   pantoprazole  40 MG tablet Commonly known as: PROTONIX  Take 40 mg by mouth daily.   polyethylene glycol 17 g packet Commonly known as: MiraLax  Take 17 g by mouth 2 (two) times daily. Skip the dose if no constipation   pomalidomide  2 MG capsule Commonly known as: POMALYST  Take 1 capsule (2 mg total) by mouth daily. Take for 21 days, the hold for 7 days. Repeat every 28 days.   traZODone  50 MG tablet Commonly known as: DESYREL  Take 0.5 tablets (25 mg total) by mouth at bedtime as needed for sleep.   Tresiba  FlexTouch 100 UNIT/ML FlexTouch Pen Generic drug: insulin  degludec Inject 5 Units into the skin daily. Home med.        Discharge Exam:   Subjective: Feeling ok, no concerns.  Significant other is present and now wants to take him home with home health services.   Objective: Vitals:   08/19/23 0239 08/19/23 0900  BP: 138/81 (!) 163/90  Pulse: 73 73  Resp: 17 17  Temp: 99.1 F (37.3 C) 98.2 F (36.8 C)  SpO2: 100% 100%    Intake/Output Summary (Last 24 hours) at  08/19/2023 1136 Last data filed at 08/19/2023 0040 Gross per 24 hour  Intake 240 ml  Output 520 ml  Net -280 ml   Filed Weights   08/15/23 1518 08/15/23 1829  Weight: 68 kg 63.7 kg    Exam:  General:  Appears calm and comfortable and is in NAD, sitting up in bedside chair Eyes:  normal lids, iris ENT:  grossly normal hearing, lips & tongue, mmm Cardiovascular:  RRR, no m/r/g. No LE edema.  Respiratory:   CTA bilaterally with no wheezes/rales/rhonchi.  Normal respiratory effort. Abdomen:  soft, NT, ND Skin:  no rash or induration seen on limited exam Musculoskeletal:  grossly normal tone BUE/BLE, good ROM, no bony abnormality Psychiatric:  appropriate mood and affect, speech fluent and appropriate Neurologic:  CN 2-12 grossly intact, moves all extremities in coordinated fashion  Data Reviewed: I have reviewed the patient's  lab results since admission.  Pertinent labs for today include:  Glucose 144 BUN 31/Creatinine 1.31/GFR 59 WBC 3 Hgb 8, stable     Condition at discharge: stable  The results of significant diagnostics from this hospitalization (including imaging, microbiology, ancillary and laboratory) are listed below for reference.   Imaging Studies: Portable chest x-ray (1 view) Result Date: 08/15/2023 CLINICAL DATA:  [id number] DKA (diabetic ketoacidosis) (HCC) 660630 EXAM: PORTABLE CHEST 1 VIEW COMPARISON:  April 05, 2023 FINDINGS: The cardiomediastinal silhouette is unchanged in contour.Atherosclerotic calcifications. Apices are obscured by overlapping chin. No pleural effusion. No pneumothorax. No acute pleuroparenchymal abnormality. Remote RIGHT-sided rib fractures. IMPRESSION: No acute cardiopulmonary abnormality. Electronically Signed   By: Clancy Crimes M.D.   On: 08/15/2023 17:49   CT Cervical Spine Wo Contrast Result Date: 07/21/2023 CLINICAL DATA:  Neck trauma.  Fall. EXAM: CT CERVICAL SPINE WITHOUT CONTRAST TECHNIQUE: Multidetector CT imaging of the cervical spine was performed without intravenous contrast. Multiplanar CT image reconstructions were also generated. RADIATION DOSE REDUCTION: This exam was performed according to the departmental dose-optimization program which includes automated exposure control, adjustment of the mA and/or kV according to patient size and/or use of iterative reconstruction technique. COMPARISON:  CT of the cervical spine 05/18/2023. FINDINGS: Alignment: Slight degenerative retrolisthesis is again noted at C5-6 and C6-7. No other significant listhesis is present. Straightening of the normal cervical lordosis is present. Skull base and vertebrae: The craniocervical junction is normal. The vertebral body heights are normal. No acute fracture is present. Soft tissues and spinal canal: No prevertebral fluid or swelling. No visible canal hematoma. Atherosclerotic  calcifications are present at the carotid bifurcations bilaterally with left greater than right proximal ICA stenosis. Disc levels: Facet hypertrophy and uncovertebral spurring result in moderate left foraminal stenosis at C3-4 and C4-5. Moderate foraminal narrowing is present bilaterally, left greater than right at C5-6 and C6-7. No significant interval change is present. Upper chest: The lung apices are clear. The thoracic inlet is within normal limits. IMPRESSION: 1. No acute fracture or traumatic subluxation. 2. Multilevel degenerative changes of the cervical spine as described. 3. Left greater than right proximal ICA stenosis. Electronically Signed   By: Audree Leas M.D.   On: 07/21/2023 17:14   CT Head Wo Contrast Result Date: 07/21/2023 CLINICAL DATA:  Fall. Patient is on blood thinners. Trauma to head. No loss of consciousness. EXAM: CT HEAD WITHOUT CONTRAST TECHNIQUE: Contiguous axial images were obtained from the base of the skull through the vertex without intravenous contrast. RADIATION DOSE REDUCTION: This exam was performed according to the departmental dose-optimization program which includes automated exposure control,  adjustment of the mA and/or kV according to patient size and/or use of iterative reconstruction technique. COMPARISON:  CT head without contrast 05/18/2023. FINDINGS: Brain: Extensive calcifications are again noted along the inter cerebral falx and tentorium. No acute infarct, hemorrhage, or mass lesion is present. Periventricular and subcortical white matter changes are mildly advanced for age, similar the prior study. Deep brain nuclei are within normal limits. The brainstem and cerebellum are within normal limits. Midline structures are within normal limits. Vascular: Atherosclerotic calcifications are present within the cavernous internal carotid arteries bilaterally. No hyperdense vessel is present. Skull: Calvarium is intact. No focal lytic or blastic lesions are  present. No significant extracranial soft tissue lesion is present. Sinuses/Orbits: The paranasal sinuses and mastoid air cells are clear. The globes and orbits are within normal limits. IMPRESSION: 1. No acute intracranial abnormality or significant interval change. 2. Periventricular and subcortical white matter changes are mildly advanced for age, similar the prior study. This likely reflects the sequela of chronic microvascular ischemia. Electronically Signed   By: Audree Leas M.D.   On: 07/21/2023 17:11    Microbiology: Results for orders placed or performed during the hospital encounter of 08/15/23  MRSA Next Gen by PCR, Nasal     Status: None   Collection Time: 08/15/23  6:14 PM   Specimen: Nasal Mucosa; Nasal Swab  Result Value Ref Range Status   MRSA by PCR Next Gen NOT DETECTED NOT DETECTED Final    Comment: (NOTE) The GeneXpert MRSA Assay (FDA approved for NASAL specimens only), is one component of a comprehensive MRSA colonization surveillance program. It is not intended to diagnose MRSA infection nor to guide or monitor treatment for MRSA infections. Test performance is not FDA approved in patients less than 75 years old. Performed at [facility], [address] [place], [place] [address]    *Note: Due to a large number of results and/or encounters for the requested time period, some results have not been displayed. A complete set of results can be found in Results Review.    Labs: CBC: Recent Labs  Lab 08/13/23 0941 08/15/23 1524 08/16/23 0431 08/17/23 0835 08/18/23 0414 08/19/23 0826  WBC 2.8* 4.5 3.7* 4.0 2.7* 3.0*  NEUTROABS 1.2*  --   --   --   --  1.5*  HGB 7.5* 10.3* 7.6* 8.6* 7.7* 8.0*  HCT 23.4* 37.3* 23.4* 27.1* 24.1* 25.0*  MCV 95.5 112.3* 95.1 95.4 95.6 95.4  PLT 189 230 241 247 206 215   Basic Metabolic Panel: Recent Labs  Lab 08/15/23 1724 08/15/23 1948 08/15/23 2349 08/16/23 0431 08/17/23 0835 08/18/23 0414  08/19/23 0826  NA  --    < > 143 144 143 142 137  K  --    < > 3.3* 3.9 4.0 4.2 4.5  CL  --    < > 113* 114* 108 109 103  CO2  --    < > 22 23 28 27 27   GLUCOSE  --    < > 350* 122* 233* 173* 144*  BUN  --    < > 66* 61* 39* 32* 31*  CREATININE  --    < > 2.36* 2.04* 1.44* 1.21 1.31*  CALCIUM   --    < > 8.2* 8.3* 8.6* 8.4* 8.2*  MG 2.3  --   --   --   --   --   --   PHOS 5.8*  --   --   --   --   --   --    < > =  values in this interval not displayed.   Liver Function Tests: Recent Labs  Lab 08/13/23 0941  AST 27  ALT 39  ALKPHOS 58  BILITOT 0.5  PROT 7.6  ALBUMIN 3.3*   CBG: Recent Labs  Lab 08/18/23 1601 08/18/23 2050 08/19/23 0006 08/19/23 0550 08/19/23 0758  GLUCAP 291* 124* 304* 90 131*    Discharge time spent: greater than 30 minutes.  Signed: Lorita Rosa, MD [facility] 08/19/2023

## 2023-08-19 NOTE — Plan of Care (Signed)
  Problem: Education: Goal: Ability to describe self-care measures that may prevent or decrease complications (Diabetes Survival Skills Education) will improve Outcome: Progressing   Problem: Coping: Goal: Ability to adjust to condition or change in health will improve Outcome: Progressing   Problem: Metabolic: Goal: Ability to maintain appropriate glucose levels will improve Outcome: Progressing   Problem: Nutritional: Goal: Maintenance of adequate nutrition will improve Outcome: Progressing   Problem: Skin Integrity: Goal: Risk for impaired skin integrity will decrease Outcome: Progressing   Problem: Tissue Perfusion: Goal: Adequacy of tissue perfusion will improve Outcome: Progressing   Problem: Education: Goal: Ability to describe self-care measures that may prevent or decrease complications (Diabetes Survival Skills Education) will improve Outcome: Progressing

## 2023-08-19 NOTE — Progress Notes (Addendum)
 Physical Therapy Treatment Patient Details Name: Nicholas Weiss MRN: [id number] DOB: 1951-01-08 Today's Date: 08/19/2023   History of Present Illness Pt admitted for hyperglycemia and DKA. HIstory includes neuropathy, GERD, DM, dysautonomia, and Afib.    PT Comments  Pt asleep on entry, easily awakened but remains somnolent throughout my assessment. Pt has large orthostatic BP drops with upright, very minimal correction overall. Pt has denies any symptoms during session. Pt able to AMB ~114ft in room with RW, no LOB, appears weak, ataxic likely due to somnolence. Pt somewhat confused at times, reports to Bolivar Bushman that he has been up AMB the halls all morning, unassisted. MD made aware of vital signs. Will continue to follow.   ABD binder and TEDhose in place; Pt NOT on midodrine  or fludrocortisone  during session.    08/19/23 1146  Therapy Vitals  Patient Position (if appropriate) Orthostatic Vitals  Orthostatic Lying   BP- Lying (!) 164/98  Pulse- Lying 84  Orthostatic Sitting (0 minutes)   BP- Sitting (S)  121/81  Pulse- Sitting 91  Orthostatic Sitting (1 minutes)                                                                                   148/89                                                                                        89  Orthostatic Standing at 0 minutes  BP- Standing at 0 minutes (S)  100/71  Pulse- Standing at 0 minutes 94  Orthostatic Standing at 1 minutes  BP- Standing at 3 minutes (S)  95/68 (standing x 1 minute)  Pulse- Standing at 3 minutes 99 (standing x 1 minute)   Orthostatic Seated after 3 minutes walking  BP- Seated after 3 minutes walking (S)  101/58 (standing x 1 minute)  Pulse- Seated after 3 minutes walking 103 (standing x 1 minute)       If plan is discharge home, recommend the following: A little help with walking and/or transfers;A little help with bathing/dressing/bathroom;Assistance with cooking/housework;Assist for  transportation;Help with stairs or ramp for entrance;Direct supervision/assist for medications management   Can travel by private vehicle        Equipment Recommendations  None recommended by PT    Recommendations for Other Services       Precautions / Restrictions Precautions Precautions: Fall Recall of Precautions/Restrictions: Impaired Precaution/Restrictions Comments: reports history of multiple falls Restrictions Weight Bearing Restrictions Per Provider Order: No     Mobility  Bed Mobility Overal bed mobility: Modified Independent Bed Mobility: Supine to Sit, Sit to Supine                Transfers Overall transfer level: Needs assistance Equipment used: Rolling walker (2 wheels), None Transfers: Sit to/from Stand  Ambulation/Gait Ambulation/Gait assistance: Contact guard assist Gait Distance (Feet): 160 Feet Assistive device: Rolling walker (2 wheels) Gait Pattern/deviations: Step-through pattern Gait velocity: 0.87m/s   Pre-gait activities: orthostatatic vital signs General Gait Details: stayed in room for seat availability and safety; pt denies any hypotension symptoms during session.   Stairs             Wheelchair Mobility     Tilt Bed    Modified Rankin (Stroke Patients Only)       Balance                                            Communication    Cognition Arousal: Lethargic Behavior During Therapy: WFL for tasks assessed/performed   PT - Cognitive impairments: History of cognitive impairments                       PT - Cognition Comments: poor historian. Able to state he is in the hospital, however confused on situation and how long he has been admitted        Cueing    Exercises      General Comments        Pertinent Vitals/Pain Pain Assessment Pain Assessment: No/denies pain    Home Living                          Prior Function             PT Goals (current goals can now be found in the care plan section) Acute Rehab PT Goals Patient Stated Goal: to go home. PT Goal Formulation: With patient Time For Goal Achievement: 08/31/23 Potential to Achieve Goals: Fair Progress towards PT goals: Not progressing toward goals - comment    Frequency    Min 2X/week      PT Plan      Co-evaluation              AM-PAC PT "6 Clicks" Mobility   Outcome Measure  Help needed turning from your back to your side while in a flat bed without using bedrails?: None Help needed moving from lying on your back to sitting on the side of a flat bed without using bedrails?: None Help needed moving to and from a bed to a chair (including a wheelchair)?: A Little Help needed standing up from a chair using your arms (e.g., wheelchair or bedside chair)?: A Little Help needed to walk in hospital room?: A Little Help needed climbing 3-5 steps with a railing? : A Lot 6 Click Score: 19    End of Session Equipment Utilized During Treatment: Gait belt Activity Tolerance: Patient tolerated treatment well Patient left: in bed;with call bell/phone within reach;with bed alarm set Nurse Communication: Mobility status PT Visit Diagnosis: Unsteadiness on feet (R26.81);Repeated falls (R29.6);Muscle weakness (generalized) (M62.81);Difficulty in walking, not elsewhere classified (R26.2)     Time: 1610-9604 PT Time Calculation (min) (ACUTE ONLY): 20 min  Charges:    $Therapeutic Activity: 8-22 mins PT General Charges $$ ACUTE PT VISIT: 1 Visit                    12:21 PM, 08/19/23 Dawn Eth, PT, DPT Physical Therapist - [facility]  [phone] (ASCOM)    Ravina Milner C 08/19/2023, 12:16 PM

## 2023-08-19 NOTE — Progress Notes (Signed)
 Discharge orders met.

## 2023-08-19 NOTE — Plan of Care (Signed)
 Pt remains with q4h CBG with SSI. Tolerating diet and would ask AM RN to bring up switching CBGs to ACHS or ACHS with 3AM.   Problem: Education: Goal: Ability to describe self-care measures that may prevent or decrease complications (Diabetes Survival Skills Education) will improve Outcome: Progressing   Problem: Coping: Goal: Ability to adjust to condition or change in health will improve Outcome: Progressing   Problem: Metabolic: Goal: Ability to maintain appropriate glucose levels will improve Outcome: Progressing   Problem: Skin Integrity: Goal: Risk for impaired skin integrity will decrease Outcome: Progressing   Problem: Education: Goal: Ability to describe self-care measures that may prevent or decrease complications (Diabetes Survival Skills Education) will improve Outcome: Progressing Goal: Individualized Educational Video(s) Outcome: Progressing   Problem: Fluid Volume: Goal: Ability to achieve a balanced intake and output will improve Outcome: Progressing   Problem: Metabolic: Goal: Ability to maintain appropriate glucose levels will improve Outcome: Progressing   Problem: Respiratory: Goal: Will regain and/or maintain adequate ventilation Outcome: Progressing   Problem: Urinary Elimination: Goal: Ability to achieve and maintain adequate renal perfusion and functioning will improve Outcome: Progressing

## 2023-08-20 ENCOUNTER — Emergency Department
Admission: EM | Admit: 2023-08-20 | Discharge: 2023-08-20 | Disposition: A | Discharge status: Home / Self Care | Attending: Emergency Medicine | Admitting: Emergency Medicine

## 2023-08-20 ENCOUNTER — Other Ambulatory Visit: Payer: Self-pay

## 2023-08-20 ENCOUNTER — Encounter: Payer: Self-pay | Admitting: Emergency Medicine

## 2023-08-20 DIAGNOSIS — Z8579 Personal history of other malignant neoplasms of lymphoid, hematopoietic and related tissues: Secondary | ICD-10-CM | POA: Diagnosis not present

## 2023-08-20 DIAGNOSIS — N1831 Chronic kidney disease, stage 3a: Secondary | ICD-10-CM | POA: Diagnosis not present

## 2023-08-20 DIAGNOSIS — N189 Chronic kidney disease, unspecified: Secondary | ICD-10-CM | POA: Insufficient documentation

## 2023-08-20 DIAGNOSIS — R55 Syncope and collapse: Secondary | ICD-10-CM

## 2023-08-20 DIAGNOSIS — E1022 Type 1 diabetes mellitus with diabetic chronic kidney disease: Secondary | ICD-10-CM | POA: Insufficient documentation

## 2023-08-20 LAB — CBC WITH DIFFERENTIAL/PLATELET
Abs Immature Granulocytes: 0.01 10*3/uL (ref 0.00–0.07)
Basophils Absolute: 0 10*3/uL (ref 0.0–0.1)
Basophils Relative: 0 %
Eosinophils Absolute: 0.2 10*3/uL (ref 0.0–0.5)
Eosinophils Relative: 6 %
HCT: 26.1 % — ABNORMAL LOW (ref 39.0–52.0)
Hemoglobin: 8.3 g/dL — ABNORMAL LOW (ref 13.0–17.0)
Immature Granulocytes: 0 %
Lymphocytes Relative: 40 %
Lymphs Abs: 1.3 10*3/uL (ref 0.7–4.0)
MCH: 31.1 pg (ref 26.0–34.0)
MCHC: 31.8 g/dL (ref 30.0–36.0)
MCV: 97.8 fL (ref 80.0–100.0)
Monocytes Absolute: 0.4 10*3/uL (ref 0.1–1.0)
Monocytes Relative: 12 %
Neutro Abs: 1.3 10*3/uL — ABNORMAL LOW (ref 1.7–7.7)
Neutrophils Relative %: 42 %
Platelets: 223 10*3/uL (ref 150–400)
RBC: 2.67 MIL/uL — ABNORMAL LOW (ref 4.22–5.81)
RDW: 17.1 % — ABNORMAL HIGH (ref 11.5–15.5)
WBC: 3.1 10*3/uL — ABNORMAL LOW (ref 4.0–10.5)
nRBC: 1.9 % — ABNORMAL HIGH (ref 0.0–0.2)

## 2023-08-20 LAB — COMPREHENSIVE METABOLIC PANEL WITH GFR
ALT: 88 U/L — ABNORMAL HIGH (ref 0–44)
AST: 56 U/L — ABNORMAL HIGH (ref 15–41)
Albumin: 2.7 g/dL — ABNORMAL LOW (ref 3.5–5.0)
Alkaline Phosphatase: 63 U/L (ref 38–126)
Anion gap: 10 (ref 5–15)
BUN: 36 mg/dL — ABNORMAL HIGH (ref 8–23)
CO2: 25 mmol/L (ref 22–32)
Calcium: 8.3 mg/dL — ABNORMAL LOW (ref 8.9–10.3)
Chloride: 101 mmol/L (ref 98–111)
Creatinine, Ser: 1.53 mg/dL — ABNORMAL HIGH (ref 0.61–1.24)
GFR, Estimated: 49 mL/min — ABNORMAL LOW (ref >60)
Glucose, Bld: 149 mg/dL — ABNORMAL HIGH (ref 70–99)
Potassium: 4.8 mmol/L (ref 3.5–5.1)
Sodium: 136 mmol/L (ref 135–145)
Total Bilirubin: 0.6 mg/dL (ref 0.0–1.2)
Total Protein: 6.9 g/dL (ref 6.5–8.1)

## 2023-08-20 LAB — TROPONIN I (HIGH SENSITIVITY): Troponin I (High Sensitivity): 37 ng/L — ABNORMAL HIGH (ref <18)

## 2023-08-20 LAB — CBG MONITORING, ED: Glucose-Capillary: 164 mg/dL — ABNORMAL HIGH (ref 70–99)

## 2023-08-20 NOTE — ED Provider Notes (Signed)
 [facility] Provider Note   Event Date/Time   First MD Initiated Contact with Patient 08/20/23 [phone]     (approximate) History  No chief complaint on file.  HPI Nicholas Weiss is a 73 y.o. male with a past medical history of smoldering multiple myeloma, chronic kidney disease, type 1 diabetes on insulin  pump, recurrent syncope and dizziness who presents via EMS after an episode of syncope.  Patient denies any complaints at this time and states that he was lowered to the ground by his family and did not strike his head.  Patient members waking up on the ground but does not remember any preceding symptoms.  EMS stated patient was orthostatic with a drop in his systolic blood pressure by 45 points when standing. ROS: Patient currently denies any vision changes, tinnitus, difficulty speaking, facial droop, sore throat, chest pain, shortness of breath, abdominal pain, nausea/vomiting/diarrhea, dysuria, or weakness/numbness/paresthesias in any extremity   Physical Exam  Triage Vital Signs: ED Triage Vitals [08/20/23 0942]  Encounter Vitals Group     BP      Systolic BP Percentile      Diastolic BP Percentile      Pulse      Resp      Temp      Temp src      SpO2      Weight 152 lb 5.4 oz (69.1 kg)     Height 5\' 10"  (1.778 m)     Head Circumference      Peak Flow      Pain Score      Pain Loc      Pain Education      Exclude from Growth Chart    Most recent vital signs: Vitals:   08/20/23 0944 08/20/23 0945  BP: (!) 131/90 (!) 131/90  Pulse: 69   Resp: 16 19  Temp: 97.7 F (36.5 C)   SpO2: 98%    General: Awake, oriented x4. CV:  Good peripheral perfusion.  Resp:  Normal effort.  Abd:  No distention.  Insulin  pump in place to right lower quadrant Other:  Elderly well-developed, well-nourished African-American male resting comfortably in no acute distress ED Results / Procedures / Treatments  Labs (all labs ordered are listed, but only abnormal  results are displayed) Labs Reviewed  COMPREHENSIVE METABOLIC PANEL WITH GFR - Abnormal; Notable for the following components:      Result Value   Glucose, Bld 149 (*)    BUN 36 (*)    Creatinine, Ser 1.53 (*)    Calcium  8.3 (*)    Albumin 2.7 (*)    AST 56 (*)    ALT 88 (*)    GFR, Estimated 49 (*)    All other components within normal limits  CBC WITH DIFFERENTIAL/PLATELET - Abnormal; Notable for the following components:   WBC 3.1 (*)    RBC 2.67 (*)    Hemoglobin 8.3 (*)    HCT 26.1 (*)    RDW 17.1 (*)    nRBC 1.9 (*)    Neutro Abs 1.3 (*)    All other components within normal limits  CBG MONITORING, ED - Abnormal; Notable for the following components:   Glucose-Capillary 164 (*)    All other components within normal limits  URINALYSIS, ROUTINE W REFLEX MICROSCOPIC  TROPONIN I (HIGH SENSITIVITY)   EKG ED ECG REPORT I, Charleen Conn, the attending physician, personally viewed and interpreted this ECG. Date: 08/20/2023 EKG Time: [phone]  Rate: 68 Rhythm: normal sinus rhythm QRS Axis: normal Intervals: normal ST/T Wave abnormalities: normal Narrative Interpretation: no evidence of acute ischemia PROCEDURES: Critical Care performed: No Procedures MEDICATIONS ORDERED IN ED: Medications - No data to display IMPRESSION / MDM / ASSESSMENT AND PLAN / ED COURSE  I reviewed the triage vital signs and the nursing notes.                             The patient is on the cardiac monitor to evaluate for evidence of arrhythmia and/or significant heart rate changes. Patient's presentation is most consistent with acute presentation with potential threat to life or bodily function. Patient presents with complaints of syncope/presyncope ED Workup:  CBC, BMP, Troponin, BNP, ECG, CXR Differential diagnosis includes HF, ICH, seizure, stroke, HOCM, ACS, aortic dissection, malignant arrhythmia, or GI bleed. Findings: No evidence of acute laboratory abnormalities.  Troponin negative  x1 EKG: No e/o STEMI. No evidence of Brugada's sign, delta wave, epsilon wave, significantly prolonged QTc, or malignant arrhythmia.  Disposition: Discharge. Patient is at baseline at this time. Return precautions expressed and understood in person. Advised follow up with primary care provider or clinic physician in next 24 hours.   FINAL CLINICAL IMPRESSION(S) / ED DIAGNOSES   Final diagnoses:  Syncope, unspecified syncope type   Rx / DC Orders   ED Discharge Orders     None      Note:  This document was prepared using Dragon voice recognition software and may include unintentional dictation errors.   Kaeden Depaz K, MD 08/20/23 [phone]

## 2023-08-20 NOTE — ED Triage Notes (Signed)
 Pt brought in by EMS for syncopal episode.  Per EMS, systolic BP dropped 45 points with standing.  Pt states he was lowered to the ground by family and did not strike his head.

## 2023-08-20 NOTE — Inpatient Diabetes Management (Signed)
 Inpatient Diabetes Program Recommendations  AACE/ADA: New Consensus Statement on Inpatient Glycemic Control (2015)  Target Ranges:  Prepandial:   less than 140 mg/dL      Peak postprandial:   less than 180 mg/dL (1-2 hours)      Critically ill patients:  140 - 180 mg/dL   Lab Results  Component Value Date   GLUCAP 164 (H) 08/20/2023   HGBA1C 10.0 (H) 07/21/2023    Review of Glycemic Control  Diabetes history: DM 1 Outpatient Diabetes medications:  Dexcom G7 sensor plus T-slim insulin  pump Insulin  pump at home (T-slim) Basal rate 12am 0.25 units/hr (24hr = 8 units) Bolus settings ISF 60 I:C ratio 20 Target 120  (Off pump Tresiba  5 units/ plus Novolog ) Current orders for Inpatient glycemic control:  None  Inpatient Diabetes Program Recommendations:   -   Semglee  5 units -   Novolog  0-6 units tid + hs  Thanks,  Eloise Hake RN, MSN, BC-ADM Inpatient Diabetes Coordinator Team Pager [phone] (8a-5p)

## 2023-08-26 ENCOUNTER — Encounter: Payer: Self-pay | Admitting: *Deleted

## 2023-08-26 ENCOUNTER — Other Ambulatory Visit: Payer: Self-pay

## 2023-08-26 DIAGNOSIS — R402142 Coma scale, eyes open, spontaneous, at arrival to emergency department: Secondary | ICD-10-CM | POA: Diagnosis present

## 2023-08-26 DIAGNOSIS — Z7901 Long term (current) use of anticoagulants: Secondary | ICD-10-CM

## 2023-08-26 DIAGNOSIS — I5032 Chronic diastolic (congestive) heart failure: Secondary | ICD-10-CM | POA: Diagnosis present

## 2023-08-26 DIAGNOSIS — I110 Hypertensive heart disease with heart failure: Secondary | ICD-10-CM | POA: Diagnosis present

## 2023-08-26 DIAGNOSIS — Z23 Encounter for immunization: Secondary | ICD-10-CM

## 2023-08-26 DIAGNOSIS — W19XXXA Unspecified fall, initial encounter: Secondary | ICD-10-CM | POA: Diagnosis present

## 2023-08-26 DIAGNOSIS — R4020 Unspecified coma: Secondary | ICD-10-CM | POA: Diagnosis not present

## 2023-08-26 DIAGNOSIS — C9000 Multiple myeloma not having achieved remission: Secondary | ICD-10-CM | POA: Diagnosis present

## 2023-08-26 DIAGNOSIS — Z89412 Acquired absence of left great toe: Secondary | ICD-10-CM

## 2023-08-26 DIAGNOSIS — I480 Paroxysmal atrial fibrillation: Secondary | ICD-10-CM | POA: Diagnosis present

## 2023-08-26 DIAGNOSIS — I421 Obstructive hypertrophic cardiomyopathy: Secondary | ICD-10-CM | POA: Diagnosis present

## 2023-08-26 DIAGNOSIS — E1040 Type 1 diabetes mellitus with diabetic neuropathy, unspecified: Secondary | ICD-10-CM | POA: Diagnosis present

## 2023-08-26 DIAGNOSIS — Z794 Long term (current) use of insulin: Secondary | ICD-10-CM

## 2023-08-26 DIAGNOSIS — Z79899 Other long term (current) drug therapy: Secondary | ICD-10-CM

## 2023-08-26 DIAGNOSIS — R402242 Coma scale, best verbal response, confused conversation, at arrival to emergency department: Secondary | ICD-10-CM | POA: Diagnosis present

## 2023-08-26 DIAGNOSIS — I951 Orthostatic hypotension: Secondary | ICD-10-CM | POA: Diagnosis present

## 2023-08-26 DIAGNOSIS — Z809 Family history of malignant neoplasm, unspecified: Secondary | ICD-10-CM

## 2023-08-26 DIAGNOSIS — Z8249 Family history of ischemic heart disease and other diseases of the circulatory system: Secondary | ICD-10-CM

## 2023-08-26 DIAGNOSIS — R402362 Coma scale, best motor response, obeys commands, at arrival to emergency department: Secondary | ICD-10-CM | POA: Diagnosis present

## 2023-08-26 DIAGNOSIS — Y92002 Bathroom of unspecified non-institutional (private) residence single-family (private) house as the place of occurrence of the external cause: Secondary | ICD-10-CM

## 2023-08-26 DIAGNOSIS — S065XAA Traumatic subdural hemorrhage with loss of consciousness status unknown, initial encounter: Secondary | ICD-10-CM | POA: Diagnosis not present

## 2023-08-26 DIAGNOSIS — Z880 Allergy status to penicillin: Secondary | ICD-10-CM

## 2023-08-26 DIAGNOSIS — D649 Anemia, unspecified: Secondary | ICD-10-CM | POA: Diagnosis present

## 2023-08-26 DIAGNOSIS — E7800 Pure hypercholesterolemia, unspecified: Secondary | ICD-10-CM | POA: Diagnosis present

## 2023-08-26 DIAGNOSIS — Z7952 Long term (current) use of systemic steroids: Secondary | ICD-10-CM

## 2023-08-26 NOTE — ED Triage Notes (Signed)
 Pt brought in via ems from home.  Pt had unwitnessed fall.  Pt doesn't know what happened,  pt has a small cut to right eye area.  Bleeding controlled.  Pt is on blood thinners.  Pt denies neck and back pain.   Pt denies chest pain.  Pt sleepy.

## 2023-08-27 ENCOUNTER — Emergency Department

## 2023-08-27 ENCOUNTER — Inpatient Hospital Stay

## 2023-08-27 ENCOUNTER — Inpatient Hospital Stay
Admission: EM | Admit: 2023-08-27 | Discharge: 2023-08-31 | DRG: 083 | Disposition: A | Discharge status: Home Health | Attending: Internal Medicine | Admitting: Internal Medicine

## 2023-08-27 DIAGNOSIS — I5032 Chronic diastolic (congestive) heart failure: Secondary | ICD-10-CM | POA: Diagnosis present

## 2023-08-27 DIAGNOSIS — I421 Obstructive hypertrophic cardiomyopathy: Secondary | ICD-10-CM | POA: Diagnosis present

## 2023-08-27 DIAGNOSIS — W19XXXA Unspecified fall, initial encounter: Secondary | ICD-10-CM | POA: Diagnosis present

## 2023-08-27 DIAGNOSIS — D649 Anemia, unspecified: Secondary | ICD-10-CM | POA: Diagnosis present

## 2023-08-27 DIAGNOSIS — Z8249 Family history of ischemic heart disease and other diseases of the circulatory system: Secondary | ICD-10-CM | POA: Diagnosis not present

## 2023-08-27 DIAGNOSIS — Y92002 Bathroom of unspecified non-institutional (private) residence single-family (private) house as the place of occurrence of the external cause: Secondary | ICD-10-CM | POA: Diagnosis not present

## 2023-08-27 DIAGNOSIS — Z794 Long term (current) use of insulin: Secondary | ICD-10-CM | POA: Diagnosis not present

## 2023-08-27 DIAGNOSIS — E7800 Pure hypercholesterolemia, unspecified: Secondary | ICD-10-CM | POA: Diagnosis present

## 2023-08-27 DIAGNOSIS — R4020 Unspecified coma: Secondary | ICD-10-CM

## 2023-08-27 DIAGNOSIS — I110 Hypertensive heart disease with heart failure: Secondary | ICD-10-CM | POA: Diagnosis present

## 2023-08-27 DIAGNOSIS — S065XAA Traumatic subdural hemorrhage with loss of consciousness status unknown, initial encounter: Secondary | ICD-10-CM

## 2023-08-27 DIAGNOSIS — Z79899 Other long term (current) drug therapy: Secondary | ICD-10-CM | POA: Diagnosis not present

## 2023-08-27 DIAGNOSIS — Z89412 Acquired absence of left great toe: Secondary | ICD-10-CM | POA: Diagnosis not present

## 2023-08-27 DIAGNOSIS — C9000 Multiple myeloma not having achieved remission: Secondary | ICD-10-CM | POA: Diagnosis present

## 2023-08-27 DIAGNOSIS — R55 Syncope and collapse: Secondary | ICD-10-CM

## 2023-08-27 DIAGNOSIS — Z7901 Long term (current) use of anticoagulants: Secondary | ICD-10-CM | POA: Diagnosis not present

## 2023-08-27 DIAGNOSIS — S0990XA Unspecified injury of head, initial encounter: Principal | ICD-10-CM

## 2023-08-27 DIAGNOSIS — R569 Unspecified convulsions: Secondary | ICD-10-CM

## 2023-08-27 DIAGNOSIS — E1040 Type 1 diabetes mellitus with diabetic neuropathy, unspecified: Secondary | ICD-10-CM | POA: Diagnosis present

## 2023-08-27 DIAGNOSIS — Z880 Allergy status to penicillin: Secondary | ICD-10-CM | POA: Diagnosis not present

## 2023-08-27 DIAGNOSIS — Z23 Encounter for immunization: Secondary | ICD-10-CM | POA: Diagnosis present

## 2023-08-27 DIAGNOSIS — Z7952 Long term (current) use of systemic steroids: Secondary | ICD-10-CM | POA: Diagnosis not present

## 2023-08-27 DIAGNOSIS — I951 Orthostatic hypotension: Secondary | ICD-10-CM | POA: Diagnosis present

## 2023-08-27 DIAGNOSIS — I6200 Nontraumatic subdural hemorrhage, unspecified: Secondary | ICD-10-CM | POA: Diagnosis present

## 2023-08-27 DIAGNOSIS — R402142 Coma scale, eyes open, spontaneous, at arrival to emergency department: Secondary | ICD-10-CM | POA: Diagnosis present

## 2023-08-27 DIAGNOSIS — R402242 Coma scale, best verbal response, confused conversation, at arrival to emergency department: Secondary | ICD-10-CM | POA: Diagnosis present

## 2023-08-27 DIAGNOSIS — I480 Paroxysmal atrial fibrillation: Secondary | ICD-10-CM | POA: Diagnosis present

## 2023-08-27 DIAGNOSIS — Z809 Family history of malignant neoplasm, unspecified: Secondary | ICD-10-CM | POA: Diagnosis not present

## 2023-08-27 DIAGNOSIS — R402362 Coma scale, best motor response, obeys commands, at arrival to emergency department: Secondary | ICD-10-CM | POA: Diagnosis present

## 2023-08-27 LAB — CBG MONITORING, ED: Glucose-Capillary: 153 mg/dL — ABNORMAL HIGH (ref 70–99)

## 2023-08-27 LAB — BASIC METABOLIC PANEL WITH GFR
Anion gap: 7 (ref 5–15)
BUN: 24 mg/dL — ABNORMAL HIGH (ref 8–23)
CO2: 24 mmol/L (ref 22–32)
Calcium: 7.9 mg/dL — ABNORMAL LOW (ref 8.9–10.3)
Chloride: 107 mmol/L (ref 98–111)
Creatinine, Ser: 1.38 mg/dL — ABNORMAL HIGH (ref 0.61–1.24)
GFR, Estimated: 55 mL/min — ABNORMAL LOW (ref >60)
Glucose, Bld: 119 mg/dL — ABNORMAL HIGH (ref 70–99)
Potassium: 3.8 mmol/L (ref 3.5–5.1)
Sodium: 138 mmol/L (ref 135–145)

## 2023-08-27 LAB — URINE DRUG SCREEN, QUALITATIVE (ARMC ONLY)
Amphetamines, Ur Screen: NOT DETECTED
Barbiturates, Ur Screen: NOT DETECTED
Benzodiazepine, Ur Scrn: NOT DETECTED
Cannabinoid 50 Ng, Ur ~~LOC~~: NOT DETECTED
Cocaine Metabolite,Ur ~~LOC~~: NOT DETECTED
MDMA (Ecstasy)Ur Screen: NOT DETECTED
Methadone Scn, Ur: NOT DETECTED
Opiate, Ur Screen: NOT DETECTED
Phencyclidine (PCP) Ur S: NOT DETECTED
Tricyclic, Ur Screen: NOT DETECTED

## 2023-08-27 LAB — URINALYSIS, ROUTINE W REFLEX MICROSCOPIC
Bacteria, UA: NONE SEEN
Bilirubin Urine: NEGATIVE
Glucose, UA: 50 mg/dL — AB
Hgb urine dipstick: NEGATIVE
Ketones, ur: NEGATIVE mg/dL
Leukocytes,Ua: NEGATIVE
Nitrite: NEGATIVE
Protein, ur: 30 mg/dL — AB
Specific Gravity, Urine: 1.02 (ref 1.005–1.030)
pH: 6 (ref 5.0–8.0)

## 2023-08-27 LAB — CBC
HCT: 25.6 % — ABNORMAL LOW (ref 39.0–52.0)
Hemoglobin: 7.8 g/dL — ABNORMAL LOW (ref 13.0–17.0)
MCH: 31.1 pg (ref 26.0–34.0)
MCHC: 30.5 g/dL (ref 30.0–36.0)
MCV: 102 fL — ABNORMAL HIGH (ref 80.0–100.0)
Platelets: 294 10*3/uL (ref 150–400)
RBC: 2.51 MIL/uL — ABNORMAL LOW (ref 4.22–5.81)
RDW: 17.5 % — ABNORMAL HIGH (ref 11.5–15.5)
WBC: 3.2 10*3/uL — ABNORMAL LOW (ref 4.0–10.5)
nRBC: 0 % (ref 0.0–0.2)

## 2023-08-27 LAB — GLUCOSE, CAPILLARY
Glucose-Capillary: 275 mg/dL — ABNORMAL HIGH (ref 70–99)
Glucose-Capillary: 241 mg/dL — ABNORMAL HIGH (ref 70–99)
Glucose-Capillary: 411 mg/dL — ABNORMAL HIGH (ref 70–99)

## 2023-08-27 LAB — TROPONIN I (HIGH SENSITIVITY)
Troponin I (High Sensitivity): 13 ng/L (ref <18)
Troponin I (High Sensitivity): 14 ng/L (ref <18)

## 2023-08-27 LAB — PROTIME-INR
INR: 1.2 (ref 0.8–1.2)
Prothrombin Time: 15.8 s — ABNORMAL HIGH (ref 11.4–15.2)

## 2023-08-27 LAB — GLUCOSE, RANDOM: Glucose, Bld: 383 mg/dL — ABNORMAL HIGH (ref 70–99)

## 2023-08-27 LAB — MRSA NEXT GEN BY PCR, NASAL: MRSA by PCR Next Gen: NOT DETECTED

## 2023-08-27 LAB — MAGNESIUM: Magnesium: 2.1 mg/dL (ref 1.7–2.4)

## 2023-08-27 MED ORDER — ATORVASTATIN CALCIUM 80 MG PO TABS
80.0000 mg | ORAL_TABLET | Freq: Every day | ORAL | Status: DC
  Administered 2023-08-27 – 2023-08-30 (×4): 80 mg via ORAL
  Filled 2023-08-27 (×2): qty 4
  Filled 2023-08-27 (×4): qty 1

## 2023-08-27 MED ORDER — MIDODRINE HCL 5 MG PO TABS
2.5000 mg | ORAL_TABLET | Freq: Three times a day (TID) | ORAL | Status: DC
  Administered 2023-08-27 – 2023-08-31 (×11): 2.5 mg via ORAL
  Filled 2023-08-27 (×12): qty 1

## 2023-08-27 MED ORDER — CHLORHEXIDINE GLUCONATE CLOTH 2 % EX PADS
6.0000 | MEDICATED_PAD | Freq: Every day | CUTANEOUS | Status: DC
  Administered 2023-08-27 – 2023-08-28 (×2): 6 via TOPICAL

## 2023-08-27 MED ORDER — PROTHROMBIN COMPLEX CONC HUMAN 500 UNITS IV KIT
3500.0000 [IU] | PACK | Status: AC
  Administered 2023-08-27: 3500 [IU] via INTRAVENOUS
  Filled 2023-08-27: qty 3000

## 2023-08-27 MED ORDER — SODIUM CHLORIDE 0.9% FLUSH
3.0000 mL | Freq: Two times a day (BID) | INTRAVENOUS | Status: DC
  Administered 2023-08-27 – 2023-08-31 (×7): 3 mL via INTRAVENOUS

## 2023-08-27 MED ORDER — SODIUM CHLORIDE 0.9 % IV SOLN: INTRAVENOUS | Status: DC

## 2023-08-27 MED ORDER — FLUDROCORTISONE ACETATE 0.1 MG PO TABS
0.1000 mg | ORAL_TABLET | Freq: Every day | ORAL | Status: DC
  Administered 2023-08-27 – 2023-08-31 (×4): 0.1 mg via ORAL
  Filled 2023-08-27 (×6): qty 1

## 2023-08-27 MED ORDER — PANTOPRAZOLE SODIUM 40 MG PO TBEC
40.0000 mg | DELAYED_RELEASE_TABLET | Freq: Every day | ORAL | Status: DC
  Administered 2023-08-27 – 2023-08-31 (×5): 40 mg via ORAL
  Filled 2023-08-27 (×5): qty 1

## 2023-08-27 MED ORDER — POMALIDOMIDE 2 MG PO CAPS: 2.0000 mg | ORAL_CAPSULE | Freq: Every day | ORAL | Status: DC

## 2023-08-27 MED ORDER — INSULIN ASPART 100 UNIT/ML IJ SOLN
0.0000 [IU] | Freq: Every day | INTRAMUSCULAR | Status: DC
  Administered 2023-08-27: 5 [IU] via SUBCUTANEOUS
  Administered 2023-08-30: 2 [IU] via SUBCUTANEOUS
  Filled 2023-08-27 (×2): qty 1

## 2023-08-27 MED ORDER — LEVETIRACETAM 500 MG PO TABS
500.0000 mg | ORAL_TABLET | Freq: Two times a day (BID) | ORAL | Status: DC
  Administered 2023-08-27 – 2023-08-31 (×9): 500 mg via ORAL
  Filled 2023-08-27 (×9): qty 1

## 2023-08-27 MED ORDER — ACYCLOVIR 200 MG PO CAPS
400.0000 mg | ORAL_CAPSULE | Freq: Two times a day (BID) | ORAL | Status: DC
  Administered 2023-08-27 – 2023-08-31 (×9): 400 mg via ORAL
  Filled 2023-08-27 (×9): qty 2

## 2023-08-27 MED ORDER — GABAPENTIN 300 MG PO CAPS
300.0000 mg | ORAL_CAPSULE | Freq: Two times a day (BID) | ORAL | Status: DC
  Administered 2023-08-27 – 2023-08-31 (×9): 300 mg via ORAL
  Filled 2023-08-27 (×9): qty 1

## 2023-08-27 MED ORDER — LEVETIRACETAM (KEPPRA) 500 MG/5 ML ADULT IV PUSH
1000.0000 mg | Freq: Once | INTRAVENOUS | Status: AC
  Administered 2023-08-27: 1000 mg via INTRAVENOUS
  Filled 2023-08-27 (×2): qty 10

## 2023-08-27 MED ORDER — ACETAMINOPHEN 500 MG PO TABS
1000.0000 mg | ORAL_TABLET | Freq: Four times a day (QID) | ORAL | Status: DC | PRN
  Administered 2023-08-27 – 2023-08-30 (×3): 1000 mg via ORAL
  Filled 2023-08-27 (×3): qty 2

## 2023-08-27 MED ORDER — POLYETHYLENE GLYCOL 3350 17 G PO PACK
17.0000 g | PACK | Freq: Two times a day (BID) | ORAL | Status: DC
  Administered 2023-08-27 – 2023-08-31 (×9): 17 g via ORAL
  Filled 2023-08-27 (×9): qty 1

## 2023-08-27 MED ORDER — TETANUS-DIPHTH-ACELL PERTUSSIS 5-2.5-18.5 LF-MCG/0.5 IM SUSY
0.5000 mL | PREFILLED_SYRINGE | Freq: Once | INTRAMUSCULAR | Status: AC
  Administered 2023-08-27: 0.5 mL via INTRAMUSCULAR
  Filled 2023-08-27: qty 0.5

## 2023-08-27 MED ORDER — NOREPINEPHRINE 4 MG/250ML-% IV SOLN
INTRAVENOUS | Status: AC
  Filled 2023-08-27: qty 250

## 2023-08-27 MED ORDER — BACITRACIN ZINC 500 UNIT/GM EX OINT
TOPICAL_OINTMENT | Freq: Once | CUTANEOUS | Status: AC
  Administered 2023-08-27: 1 via TOPICAL
  Filled 2023-08-27: qty 0.9

## 2023-08-27 MED ORDER — BISACODYL 5 MG PO TBEC
5.0000 mg | DELAYED_RELEASE_TABLET | Freq: Every day | ORAL | Status: DC
  Administered 2023-08-27 – 2023-08-30 (×4): 5 mg via ORAL
  Filled 2023-08-27 (×4): qty 1

## 2023-08-27 MED ORDER — ORAL CARE MOUTH RINSE: 15.0000 mL | OROMUCOSAL | Status: DC | PRN

## 2023-08-27 MED ORDER — INSULIN ASPART 100 UNIT/ML IJ SOLN
0.0000 [IU] | Freq: Three times a day (TID) | INTRAMUSCULAR | Status: DC
  Administered 2023-08-27: 5 [IU] via SUBCUTANEOUS
  Administered 2023-08-27: 3 [IU] via SUBCUTANEOUS
  Administered 2023-08-28: 8 [IU] via SUBCUTANEOUS
  Administered 2023-08-28: 11 [IU] via SUBCUTANEOUS
  Administered 2023-08-28: 2 [IU] via SUBCUTANEOUS
  Administered 2023-08-29 (×2): 5 [IU] via SUBCUTANEOUS
  Administered 2023-08-30: 3 [IU] via SUBCUTANEOUS
  Administered 2023-08-30 – 2023-08-31 (×2): 5 [IU] via SUBCUTANEOUS
  Filled 2023-08-27 (×10): qty 1

## 2023-08-27 NOTE — ED Notes (Signed)
 EEG in progress

## 2023-08-27 NOTE — Significant Event (Signed)
       CROSS COVER NOTE  NAME: Nicholas Weiss MRN: [id number] DOB : 04-Jul-1950 ATTENDING PHYSICIAN: [place], MD    Date of Service   08/27/2023   HPI/Events of Note     Interventions   Assessment/Plan:    08/27/2023    8:00 PM 08/27/2023    6:00 PM 08/27/2023    5:00 PM  Vitals with BMI  Systolic 171  156  Diastolic 97  89  Pulse 70 67 62   SR with prolonged QT on bedside cardiac monitor. QTc 533 Stat mag - 2.1 Decrease IV fluids to 50 avoid QT prolonging meds BP improved 150/92 without additional measures - agitation frequent assessment may be contributing BS 411 - give 5 units  from sliding scale and recheck CBG 0200       Kip Peon NP [facility] Cross Cover 7pm-7am - check amion for availability Pager [phone]

## 2023-08-27 NOTE — Progress Notes (Signed)
 Eeg done

## 2023-08-27 NOTE — ED Notes (Signed)
 RN to bedside to move pt to ICU. Pt has pulled one of his Ivs out at this time.

## 2023-08-27 NOTE — Progress Notes (Signed)
 Notified NP Boston Byers about patient's complaints of pain, blood pressure and glucose level. NP came to bedside to assess, gave orders to decrease maintenance fluids due to elevated blood pressure. Ordered tylenol  for pain and 5 units of insulin  for glucose level.

## 2023-08-27 NOTE — Plan of Care (Signed)
  Problem: Fluid Volume: Goal: Ability to maintain a balanced intake and output will improve Outcome: Progressing   Problem: Nutritional: Goal: Maintenance of adequate nutrition will improve Outcome: Progressing   Problem: Physical Regulation: Goal: Complications related to the disease process, condition or treatment will be avoided or minimized Outcome: Progressing   Problem: Clinical Measurements: Goal: Will remain free from infection Outcome: Progressing Goal: Diagnostic test results will improve Outcome: Progressing Goal: Cardiovascular complication will be avoided Outcome: Progressing   Problem: Coping: Goal: Level of anxiety will decrease Outcome: Progressing   Problem: Elimination: Goal: Will not experience complications related to urinary retention Outcome: Progressing   Problem: Pain Managment: Goal: General experience of comfort will improve and/or be controlled Outcome: Progressing

## 2023-08-27 NOTE — Procedures (Signed)
 Patient Name: Nicholas Weiss  MRN: [id number]  Epilepsy Attending: Arleene Lack  Referring Physician/Provider: [place], MD  Date: 08/27/2023 Duration: 31.23 mins  Patient history: 73yo M with h/o seizures came with syncope. EEG to evaluate for seizure  Level of alertness: Awake, asleep  AEDs during EEG study: LEV, GBP  Technical aspects: This EEG study was done with scalp electrodes positioned according to the 10-20 International system of electrode placement. Electrical activity was reviewed with band pass filter of 1-70Hz , sensitivity of 7 uV/mm, display speed of 66mm/sec with a 60Hz  notched filter applied as appropriate. EEG data were recorded continuously and digitally stored.  Video monitoring was available and reviewed as appropriate.  Description: The posterior dominant rhythm consists of 7.5 Hz activity of moderate voltage (25-35 uV) seen predominantly in posterior head regions, symmetric and reactive to eye opening and eye closing. Sleep was characterized by sleep spindles (12 to 14 Hz), maximal frontocentral region. EEG showed continuous generalized 5 to 7 Hz theta slowing admixed with intermittent generalized 2-3 Hz delta slowing, at times with triphasic morphology. Hyperventilation and photic stimulation were not performed.      ABNORMALITY - Continuous slow, generalized   IMPRESSION: This study is suggestive of moderate diffuse encephalopathy. No seizures or epileptiform discharges were seen throughout the recording.   Kendel Pesnell O Madelin Weseman

## 2023-08-27 NOTE — Progress Notes (Signed)
 Prior-To-Admission Oral Chemotherapy for Treatment of Oncologic Disease   Order noted from Dr. Antoniette Batty to continue prior-to-admission oral chemotherapy regimen of Pomalidomide .  Procedure Per Pharmacy & Therapeutics Committee Policy: Orders for continuation of home oral chemotherapy for treatment of an oncologic disease will be held unless approved by an oncologist during current admission.    For patients receiving oncology care at [facility], inpatient pharmacist contacts patient's oncologist during regular office hours to review. If earlier review is medically necessary, attending physician consults [facility] on-call oncologist   For patients receiving oncology care outside of [facility], attending physician consults patient's oncologist to review. If this oncologist or their coverage cannot be reached, attending physician consults [facility] on-call oncologist   Oral chemotherapy continuation order is on hold pending oncologist review, [facility] oncologist Randy Buttery will be notified by inpatient pharmacy during office hours    Per communication with Dr. Randy Buttery- hold Pomalidomide  at this time   Thomasine Flick PharmD Clinical Pharmacist 08/27/2023

## 2023-08-27 NOTE — H&P (Signed)
 History and Physical    ADIEN KIMMEL UEA:[id number] DOB: March 08, 1951 DOA: 08/27/2023  PCP: Monique Ano, MD (Confirm with patient/family/NH records and if not entered, this has to be entered at [facility] point of entry) Patient coming from: Home  I have personally briefly reviewed patient's old medical records in [facility]  Chief Complaint: Fall and hit my head  HPI: JEVON SHELLS is a 73 y.o. male with medical history significant of IDDM, diabetic neuropathy, orthostatic hypotension, HTN, chronic HFpEF, mild HOCM, PAF on Eliquis , multiple myeloma on chronic chemotherapy, seizure on Keppra , presented with fall at home.  Patient does not remember what has happened, or history given by phone interviewing with significant other as well as patient's son, both live with patient.  Family reported that patient has been complaining about lightheadedness despite taking daily fludrocortisone  and as needed midodrine .  Significant other reported that patient has had frequent episode of hypotension since last week and had to take midodrine .  Yesterday morning SBP 70s and patient took 1 midodrine .  At night around 1 AM, patient woke up to go to bathroom and fell.  Family member heard a loud thump and went to see the patient and saw him on the floor cold clammy and a few twitching like movement but no overall shakiness.  The episode lasted about 4-5 minutes and patient recovered consciousness himself and was confused for short period time.  There was no tongue biting or loss control of urine or bowel movements.  ED Course: Afebrile, blood pressure 160/80 dropped to 117/68 on standing.  O2 saturation 100% on room air.  CT head showed small SDH 5 mm in the left cerebral convexity, repeat CT head in 6 hours showed stable SDH, right side mixed density 4 to 5 mm not significant changed, (which low-density and smaller (more hypodensity right parafalcine SDH 3 mm no mass effect.  Trace rightward midline shift.   Neurosurgeon consulted.  Blood work showed hemoglobin 7.8 compared to 8.3 BUN 24 creatinine 1.3 K3.8.  UA negative for acute findings.  Patient was given Kcentra in the ED.  Review of Systems: As per HPI otherwise 14 point review of systems negative.    Past Medical History:  Diagnosis Date   Acute metabolic encephalopathy 12/02/2020   DKA (diabetic ketoacidoses) 04/06/2016   Hypercholesteremia    Hypertension     Past Surgical History:  Procedure Laterality Date   AMPUTATION TOE Left 04/25/2022   Procedure: LEFT GREAT TOE AMPUTATION AND LEFT PARTIAL 2ND TOE AMPUTATION;  Surgeon: Dot Gazella, DPM;  Location: [facility] ORS;  Service: Podiatry;  Laterality: Left;   COLONOSCOPY WITH PROPOFOL  N/A 02/01/2020   Procedure: COLONOSCOPY WITH PROPOFOL ;  Surgeon: Selena Daily, MD;  Location: [facility] ENDOSCOPY;  Service: Gastroenterology;  Laterality: N/A;   ESOPHAGOGASTRODUODENOSCOPY  02/01/2020   Procedure: ESOPHAGOGASTRODUODENOSCOPY (EGD);  Surgeon: Selena Daily, MD;  Location: [facility] ENDOSCOPY;  Service: Gastroenterology;;   ESOPHAGOGASTRODUODENOSCOPY (EGD) WITH PROPOFOL  N/A 06/07/2022   Procedure: ESOPHAGOGASTRODUODENOSCOPY (EGD) WITH PROPOFOL ;  Surgeon: Genell Ken, MD;  Location: [facility] ENDOSCOPY;  Service: Gastroenterology;  Laterality: N/A;   KNEE SURGERY Right    Torn meniscus   KNEE SURGERY Left      reports that he has never smoked. He has never used smokeless tobacco. He reports that he does not currently use alcohol. He reports current drug use. Drugs: Marijuana and "Crack" cocaine.  Allergies  Allergen Reactions   Penicillins Other (See Comments)    Childhood allergy -tolerated amoxil  03/2022  Unknown reaction Has patient had a PCN reaction causing immediate rash, facial/tongue/throat swelling, SOB or lightheadedness with hypotension: No Has patient had a PCN reaction causing severe rash involving mucus membranes or skin necrosis: No Has patient had a PCN reaction that required  hospitalization No Has patient had a PCN reaction occurring within the last 10 years: No If all of the above answers are "NO", then may proceed with Cephalosporin use.     Family History  Problem Relation Age of Onset   Heart attack Father    Hypertension Sister    Cancer Sister      Prior to Admission medications   Medication Sig Start Date End Date Taking? Authorizing Provider  acetaminophen  (TYLENOL ) 500 MG tablet Take 1,000 mg by mouth daily as needed for mild pain or moderate pain.   Yes [provider]  acyclovir  (ZOVIRAX ) 400 MG tablet Take 1 tablet (400 mg total) by mouth 2 (two) times daily. 08/13/23 11/11/23 Yes Avonne Boettcher, MD  apixaban  (ELIQUIS ) 5 MG TABS tablet Take 1 tablet (5 mg total) by mouth 2 (two) times daily. Start Eliquis  after 2 weeks which will be 03/02/2023 03/02/23  Yes Althia Atlas, MD  ascorbic acid  (VITAMIN C ) 500 MG tablet Take 1 tablet (500 mg total) by mouth daily. 04/09/22  Yes Luna Salinas, MD  atorvastatin  (LIPITOR ) 80 MG tablet Take 1 tablet (80 mg total) by mouth at bedtime. Hold while taking Paxlovid  04/08/22  Yes Luna Salinas, MD  bisacodyl  5 MG EC tablet Take 1 tablet (5 mg total) by mouth at bedtime. Skip the dose if no constipation 02/24/23  Yes Althia Atlas, MD  fludrocortisone  (FLORINEF ) 0.1 MG tablet Take 1 tablet (0.1 mg total) by mouth daily. Skip the dose if SBP >130 02/25/23  Yes Althia Atlas, MD  gabapentin  (NEURONTIN ) 300 MG capsule Take 300 mg by mouth 2 (two) times daily. 03/03/23  Yes [provider]  Insulin  Aspart FlexPen (NOVOLOG ) 100 UNIT/ML Sliding Scale: Glucose 70 - 120: 0 units Glucose 121 - 150: 1 unit Glucose 151 - 200: 2 units Glucose 201 - 250: 3 units Glucose 251 - 300: 5 units Glucose 301 - 350: 7 units Glucose 351 - 400: 9 units Glucose > 400: call your docotr 03/03/23  Yes [provider]  insulin  degludec (TRESIBA  FLEXTOUCH) 100 UNIT/ML FlexTouch Pen Inject 5 Units into the skin daily. Home med.  04/10/23  Yes Tiajuana Fluke, MD  levETIRAcetam  (KEPPRA ) 500 MG tablet Take 1 tablet by mouth twice daily 08/09/23  Yes Vaslow, [name] K, MD  midodrine  (PROAMATINE ) 2.5 MG tablet Take 2.5 mg by mouth 3 (three) times daily.   Yes [provider]  Multiple Vitamin (MULTIVITAMIN WITH MINERALS) TABS tablet Take 1 tablet by mouth daily. 04/09/22  Yes Luna Salinas, MD  ondansetron  (ZOFRAN ) 8 MG tablet Take 8 mg by mouth daily as needed for nausea or vomiting.   Yes [provider]  pantoprazole  (PROTONIX ) 40 MG tablet Take 40 mg by mouth daily. 04/11/23  Yes [provider]  pomalidomide  (POMALYST ) 2 MG capsule Take 1 capsule (2 mg total) by mouth daily. Take for 21 days, the hold for 7 days. Repeat every 28 days. 08/13/23  Yes Avonne Boettcher, MD  Continuous Glucose Sensor (DEXCOM G7 SENSOR) MISC 1 each by Does not apply route 4 (four) times daily -  before meals and at bedtime. 04/10/23   Tiajuana Fluke, MD  iron  polysaccharides (NIFEREX) 150 MG capsule Take 1 capsule (  150 mg total) by mouth daily. 03/20/23 07/21/23  Althia Atlas, MD  polyethylene glycol (MIRALAX ) 17 g packet Take 17 g by mouth 2 (two) times daily. Skip the dose if no constipation 02/24/23   Althia Atlas, MD  traZODone  (DESYREL ) 50 MG tablet Take 0.5 tablets (25 mg total) by mouth at bedtime as needed for sleep. Patient not taking: Reported on 08/27/2023 02/24/23   Althia Atlas, MD    Physical Exam: Vitals:   08/27/23 0210 08/27/23 0211 08/27/23 0214 08/27/23 0541  BP: (!) 155/81 (!) 154/87 117/68 (!) 175/92  Pulse: 66 66 80 65  Resp: 14 12 13 12   Temp:    98.7 F (37.1 C)  TempSrc:    Oral  SpO2: 100% 100% 100% 100%  Weight:      Height:        Constitutional: NAD, calm, comfortable Vitals:   08/27/23 0210 08/27/23 0211 08/27/23 0214 08/27/23 0541  BP: (!) 155/81 (!) 154/87 117/68 (!) 175/92  Pulse: 66 66 80 65  Resp: 14 12 13 12   Temp:    98.7 F (37.1 C)  TempSrc:    Oral  SpO2:  100% 100% 100% 100%  Weight:      Height:       Eyes: PERRL, lids and conjunctivae normal ENMT: Mucous membranes are moist. Posterior pharynx clear of any exudate or lesions.Normal dentition.  Neck: normal, supple, no masses, no thyromegaly Respiratory: clear to auscultation bilaterally, no wheezing, no crackles. Normal respiratory effort. No accessory muscle use.  Cardiovascular: Regular rate and rhythm, no murmurs / rubs / gallops. No extremity edema. 2+ pedal pulses. No carotid bruits.  Abdomen: no tenderness, no masses palpated. No hepatosplenomegaly. Bowel sounds positive.  Musculoskeletal: no clubbing / cyanosis. No joint deformity upper and lower extremities. Good ROM, no contractures. Normal muscle tone.  Skin: no rashes, lesions, ulcers. No induration Neurologic: CN 2-12 grossly intact. Sensation intact, DTR normal. Strength 5/5 in all 4.  Psychiatric: Normal judgment and insight. Alert and oriented x 3. Normal mood.     Labs on Admission: I have personally reviewed following labs and imaging studies  CBC: Recent Labs  Lab 08/20/23 0958 08/26/23 2357  WBC 3.1* 3.2*  NEUTROABS 1.3*  --   HGB 8.3* 7.8*  HCT 26.1* 25.6*  MCV 97.8 102.0*  PLT 223 294   Basic Metabolic Panel: Recent Labs  Lab 08/20/23 0958 08/26/23 2357  NA 136 138  K 4.8 3.8  CL 101 107  CO2 25 24  GLUCOSE 149* 119*  BUN 36* 24*  CREATININE 1.53* 1.38*  CALCIUM  8.3* 7.9*   GFR: Estimated Creatinine Clearance: 48.6 mL/min (A) (by C-G formula based on SCr of 1.38 mg/dL (H)). Liver Function Tests: Recent Labs  Lab 08/20/23 0958  AST 56*  ALT 88*  ALKPHOS 63  BILITOT 0.6  PROT 6.9  ALBUMIN 2.7*   No results for input(s): "LIPASE", "AMYLASE" in the last 168 hours. No results for input(s): "AMMONIA " in the last 168 hours. Coagulation Profile: Recent Labs  Lab 08/26/23 2357  INR 1.2   Cardiac Enzymes: No results for input(s): "CKTOTAL", "CKMB", "CKMBINDEX", "TROPONINI" in the last 168  hours. BNP (last 3 results) No results for input(s): "PROBNP" in the last 8760 hours. HbA1C: No results for input(s): "HGBA1C" in the last 72 hours. CBG: Recent Labs  Lab 08/20/23 0944  GLUCAP 164*   Lipid Profile: No results for input(s): "CHOL", "HDL", "LDLCALC", "TRIG", "CHOLHDL", "LDLDIRECT" in the last 72 hours. Thyroid  Function  Tests: No results for input(s): "TSH", "T4TOTAL", "FREET4", "T3FREE", "THYROIDAB" in the last 72 hours. Anemia Panel: No results for input(s): "VITAMINB12", "FOLATE", "FERRITIN", "TIBC", "IRON ", "RETICCTPCT" in the last 72 hours. Urine analysis:    Component Value Date/Time   COLORURINE YELLOW (A) 08/27/2023 0622   APPEARANCEUR CLEAR (A) 08/27/2023 0622   LABSPEC 1.020 08/27/2023 0622   PHURINE 6.0 08/27/2023 0622   GLUCOSEU 50 (A) 08/27/2023 0622   HGBUR NEGATIVE 08/27/2023 0622   BILIRUBINUR NEGATIVE 08/27/2023 0622   KETONESUR NEGATIVE 08/27/2023 0622   PROTEINUR 30 (A) 08/27/2023 0622   NITRITE NEGATIVE 08/27/2023 0622   LEUKOCYTESUR NEGATIVE 08/27/2023 0622    Radiological Exams on Admission: CT HEAD WO CONTRAST ( ) Result Date: 08/27/2023 CLINICAL DATA:  73 year old male status post fall with hypodense left subdural hematoma. EXAM: CT HEAD WITHOUT CONTRAST TECHNIQUE: Contiguous axial images were obtained from the base of the skull through the vertex without intravenous contrast. RADIATION DOSE REDUCTION: This exam was performed according to the departmental dose-optimization program which includes automated exposure control, adjustment of the mA and/or kV according to patient size and/or use of iterative reconstruction technique. COMPARISON:  Head CT 0015 hours today. FINDINGS: Brain: Redemonstrated left side subdural hematoma, hyperdense blood products noted along the left frontal operculum level on coronal image 34. Left SDH thickness is 4-5 mm and not significantly changed. Smaller contralateral and mostly superior convexity low-density  right side subdural hematoma also noted, increased in conspicuity but small measuring 2-3 mm (coronal image 45). Also there is hyperdense para falcine subdural blood, small volume (coronal image 34) up to 3 mm and stable superimposed on dural calcification. Trace rightward midline shift (series 6, image 15). Ventricle size and configuration remains normal. No IVH. No subarachnoid or intra-axial blood. Stable gray-white matter differentiation throughout the brain. No cortically based acute infarct identified. Vascular: Calcified atherosclerosis at the skull base. No suspicious intracranial vascular hyperdensity. Skull: Stable.  No skull fracture identified. Sinuses/Orbits: Visualized paranasal sinuses and mastoids are stable and well aerated. Other: Leftward gaze now. No acute orbit or scalp soft tissue injury is identified. IMPRESSION: 1. Positive for small Bilateral Subdural Hematomas: - right side mixed density SDH is 4-5 mm thickness and not significantly changed from 0015 hours. - left SDH is low-density and smaller, 2-3 mm, more conspicuous since the prior exam. - small hyperdense right parafalcine SDH is stable, 3 mm. 2. No skull fracture identified. No significant intracranial mass effect at this time, trace rightward midline shift. Electronically Signed   By: Marlise Simpers M.D.   On: 08/27/2023 06:19   CT Cervical Spine Wo Contrast Result Date: 08/27/2023 CLINICAL DATA:  Fall.  Neck trauma (Age >= 65y) EXAM: CT CERVICAL SPINE WITHOUT CONTRAST TECHNIQUE: Multidetector CT imaging of the cervical spine was performed without intravenous contrast. Multiplanar CT image reconstructions were also generated. RADIATION DOSE REDUCTION: This exam was performed according to the departmental dose-optimization program which includes automated exposure control, adjustment of the mA and/or kV according to patient size and/or use of iterative reconstruction technique. COMPARISON:  07/21/2023 FINDINGS: Alignment: Normal Skull  base and vertebrae: No acute fracture. No primary bone lesion or focal pathologic process. Soft tissues and spinal canal: No prevertebral fluid or swelling. No visible canal hematoma. Disc levels: Degenerative disc disease changes at C5-6 and C6-7 with disc space narrowing and spurring. Moderate degenerative facet disease, left greater than right. Upper chest: No acute findings Other: None IMPRESSION: Degenerative changes.  No acute bony abnormality. Electronically Signed   By:  Janeece Mechanic M.D.   On: 08/27/2023 00:29   CT HEAD WO CONTRAST ( ) Result Date: 08/27/2023 CLINICAL DATA:  Head trauma, minor (Age >= 65y).  Fall. EXAM: CT HEAD WITHOUT CONTRAST TECHNIQUE: Contiguous axial images were obtained from the base of the skull through the vertex without intravenous contrast. RADIATION DOSE REDUCTION: This exam was performed according to the departmental dose-optimization program which includes automated exposure control, adjustment of the mA and/or kV according to patient size and/or use of iterative reconstruction technique. COMPARISON:  07/21/2023 FINDINGS: Brain: There is a low-density to isodense subdural fluid collection noted over the left cerebral convexity, new since prior study concerning for isodense subdural hematoma. This measures approximately 5 mm in thickness on coronal image 48, series 4. No mass effect or midline shift. No hydrocephalus. No intracerebral hemorrhage or acute infarction. Again noted are extensive calcifications along the falx and tentorium. Vascular: No hyperdense vessel or unexpected calcification. Skull: No acute calvarial abnormality. Sinuses/Orbits: No acute findings Other: None IMPRESSION: Low-density to isodense subdural fluid collection measuring 5 mm in thickness over the left cerebral convexity concerning for small subdural hematoma. This is age indeterminate but new since prior study suggesting acute or subacute. No mass effect or midline shift. Electronically Signed    By: Janeece Mechanic M.D.   On: 08/27/2023 00:27    EKG: Independently reviewed.  Sinus rhythm, no acute PR or QTc interval changes.  Assessment/Plan Principal Problem:   Subdural hematoma (HCC) Active Problems:   Subdural bleeding (HCC)  (please populate well all problems here in Problem List. (For example, if patient is on BP meds at home and you resume or decide to hold them, it is a problem that needs to be her. Same for CAD, COPD, HLD and so on)  Syncope and fall Traumatic SDH - Likely secondary to orthostatic hypotension - Patient already on maximal therapy for orthostatic hypotension, including medications of fludrocortisone  as well as a as needed midodrine .  Appears that the patient also has a baseline hypotension and midodrine  has been only as needed.  I discussed with family regarding the safety of as needed midodrine  versus scheduled midodrine .  And patient has been already on compression stocking as well.  Prognosis is poor given the episode of frequent symptoms of orthostatic hypotension and frequent falls.  Family made aware.  Also discussed with family regarding the safety benefit and risk of anticoagulation in patient's case, all parties agreed with discontinue anticoagulation. -Repeat head CT showed stabilized SDH picture with mild midline shift.  No neurological deficit, neurosurgeon will see the patient. - Continue to check orthostatic vital signs - PT OT evaluation - Other DDx, low suspicion for breakthrough seizure as patient has been compliant with seizure medication at home as well as patient has prodrome of lightheadedness before the episode and low BP reading at home before the episode as well.  Orthostatic hypotension - As above  IDDM - Continue SSI  Diabetic neuropathy - Continue gabapentin   MM - Continue pomalidomide   PAF - In sinus rhythm - Discontinue Eliquis  as discussed above.  DVT prophylaxis: SCD Code Status: Full code Family Communication: Son and  significant other over the phone Disposition Plan: Patient is sick with SDH while on Eliquis  therapy requiring Kcentra treatment, thus requiring close monitoring clinically, expect more than 2 midnight hospital stay Consults called: Neurosurgery Admission status: Stepdown admit   [place] MD [facility] Pager [phone]  08/27/2023, 9:30 AM

## 2023-08-27 NOTE — Consult Note (Signed)
 Consult requested by:  Dr. Author Board  Consult requested for:  Subdural hematoma  Primary Physician:  Monique Ano, MD  History of Present Illness: 08/27/2023 Nicholas Weiss is here today with a chief complaint of being found down.  His significant other found him down with low blood pressure.  He was brought to the emergency department for evaluation, where workup showed a subdural hematoma.  He has improved since coming to the emergency department.  He is now able to answer questions.  He did not recall any of the events that brought him to the hospital.  He has an abrasion above his right eye but does not remember falling.  He denies headache, nausea, or vomiting.  He feels that he is at his baseline.  The symptoms are causing a significant impact on the patient's life.   I have utilized the care everywhere function in epic to review the outside records available from external health systems.  Review of Systems:  A 10 point review of systems is negative, except for the pertinent positives and negatives detailed in the HPI.  Past Medical History: Past Medical History:  Diagnosis Date   Acute metabolic encephalopathy 12/02/2020   DKA (diabetic ketoacidoses) 04/06/2016   Hypercholesteremia    Hypertension     Past Surgical History: Past Surgical History:  Procedure Laterality Date   AMPUTATION TOE Left 04/25/2022   Procedure: LEFT GREAT TOE AMPUTATION AND LEFT PARTIAL 2ND TOE AMPUTATION;  Surgeon: Dot Gazella, DPM;  Location: [facility] ORS;  Service: Podiatry;  Laterality: Left;   COLONOSCOPY WITH PROPOFOL  N/A 02/01/2020   Procedure: COLONOSCOPY WITH PROPOFOL ;  Surgeon: Selena Daily, MD;  Location: [facility] ENDOSCOPY;  Service: Gastroenterology;  Laterality: N/A;   ESOPHAGOGASTRODUODENOSCOPY  02/01/2020   Procedure: ESOPHAGOGASTRODUODENOSCOPY (EGD);  Surgeon: Selena Daily, MD;  Location: [facility] ENDOSCOPY;  Service: Gastroenterology;;   ESOPHAGOGASTRODUODENOSCOPY  (EGD) WITH PROPOFOL  N/A 06/07/2022   Procedure: ESOPHAGOGASTRODUODENOSCOPY (EGD) WITH PROPOFOL ;  Surgeon: Genell Ken, MD;  Location: [facility] ENDOSCOPY;  Service: Gastroenterology;  Laterality: N/A;   KNEE SURGERY Right    Torn meniscus   KNEE SURGERY Left     Allergies: Allergies as of 08/26/2023 - Review Complete 08/26/2023  Allergen Reaction Noted   Penicillins Other (See Comments) 10/16/2014    Medications: Current Meds  Medication Sig   acetaminophen  (TYLENOL ) 500 MG tablet Take 1,000 mg by mouth daily as needed for mild pain or moderate pain.   acyclovir  (ZOVIRAX ) 400 MG tablet Take 1 tablet (400 mg total) by mouth 2 (two) times daily.   apixaban  (ELIQUIS ) 5 MG TABS tablet Take 1 tablet (5 mg total) by mouth 2 (two) times daily. Start Eliquis  after 2 weeks which will be 03/02/2023   ascorbic acid  (VITAMIN C ) 500 MG tablet Take 1 tablet (500 mg total) by mouth daily.   atorvastatin  (LIPITOR ) 80 MG tablet Take 1 tablet (80 mg total) by mouth at bedtime. Hold while taking Paxlovid    bisacodyl  5 MG EC tablet Take 1 tablet (5 mg total) by mouth at bedtime. Skip the dose if no constipation   fludrocortisone  (FLORINEF ) 0.1 MG tablet Take 1 tablet (0.1 mg total) by mouth daily. Skip the dose if SBP >130   gabapentin  (NEURONTIN ) 300 MG capsule Take 300 mg by mouth 2 (two) times daily.   Insulin  Aspart FlexPen (NOVOLOG ) 100 UNIT/ML Sliding Scale: Glucose 70 - 120: 0 units Glucose 121 - 150: 1 unit Glucose 151 - 200: 2 units Glucose 201 - 250: 3  units Glucose 251 - 300: 5 units Glucose 301 - 350: 7 units Glucose 351 - 400: 9 units Glucose > 400: call your docotr   insulin  degludec (TRESIBA  FLEXTOUCH) 100 UNIT/ML FlexTouch Pen Inject 5 Units into the skin daily. Home med.   levETIRAcetam  (KEPPRA ) 500 MG tablet Take 1 tablet by mouth twice daily   midodrine  (PROAMATINE ) 2.5 MG tablet Take 2.5 mg by mouth 3 (three) times daily.   Multiple Vitamin (MULTIVITAMIN WITH MINERALS) TABS tablet Take 1 tablet by  mouth daily.   ondansetron  (ZOFRAN ) 8 MG tablet Take 8 mg by mouth daily as needed for nausea or vomiting.   pantoprazole  (PROTONIX ) 40 MG tablet Take 40 mg by mouth daily.   pomalidomide  (POMALYST ) 2 MG capsule Take 1 capsule (2 mg total) by mouth daily. Take for 21 days, the hold for 7 days. Repeat every 28 days.    Social History: Social History   Tobacco Use   Smoking status: Never   Smokeless tobacco: Never  Vaping Use   Vaping status: Never Used  Substance Use Topics   Alcohol use: Not Currently    Alcohol/week: 0.0 - 1.0 standard drinks of alcohol    Comment: "once every 2 months"   Drug use: Yes    Types: Marijuana, "Crack" cocaine    Comment: last week    Family Medical History: Family History  Problem Relation Age of Onset   Heart attack Father    Hypertension Sister    Cancer Sister     Physical Examination: Vitals:   08/27/23 0214 08/27/23 0541  BP: 117/68 (!) 175/92  Pulse: 80 65  Resp: 13 12  Temp:  98.7 F (37.1 C)  SpO2: 100% 100%    General: Patient is in no apparent distress. Attention to examination is appropriate.  Neck:   Supple.  Full range of motion.  Respiratory: Patient is breathing without any difficulty.   NEUROLOGICAL:     Awake, alert, oriented to person, place, and time.  Speech is clear and fluent.  Cranial Nerves: Pupils equal round and reactive to light.  Facial tone is symmetric.  Facial sensation is symmetric. Shoulder shrug is symmetric. Tongue protrusion is midline.  There is no pronator drift.  Strength: MAEW 5/5  Bilateral upper and lower extremity sensation is intact to light touch.    No evidence of dysmetria noted.  Gait is untested.     Medical Decision Making  Imaging: CT Head 08/27/2023  IMPRESSION: 1. Positive for small Bilateral Subdural Hematomas: - right side mixed density SDH is 4-5 mm thickness and not significantly changed from 0015 hours. - left SDH is low-density and smaller, 2-3 mm, more  conspicuous since the prior exam. - small hyperdense right parafalcine SDH is stable, 3 mm.   2. No skull fracture identified. No significant intracranial mass effect at this time, trace rightward midline shift.     Electronically Signed   By: Marlise Simpers M.D.   On: 08/27/2023 06:19  I have personally reviewed the images and agree with the above interpretation.  Assessment and Plan: Nicholas Weiss is a pleasant 73 y.o. male with mixed density subdural hematomas.  These are stable on repeat imaging.  He is on Eliquis .  -No need for surgical intervention or further imaging unless he has a change in neurological status - Hold Eliquis  for at least 2 weeks -Continue antiepileptics -I will arrange outpatient follow-up with a repeat CT scan in approximately 2 weeks.    I have communicated  my recommendations to the requesting physician and coordinated care to facilitate these recommendations.     Nicholas Weiss K. Mont Antis MD, [facility] Neurosurgery

## 2023-08-27 NOTE — ED Provider Notes (Signed)
 [facility] Provider Note    Event Date/Time   First MD Initiated Contact with Patient 08/27/23 0116     (approximate)   History   Fall   HPI  Nicholas Weiss is a 73 y.o. male with history of a fib on Eliquis , cocaine abuse, chronic kidney disease, hypertension, hyperlipidemia, poorly controlled type 1 diabetes, multiple myeloma with leukopenia, anemia followed by oncology on Darzalex  and Pomalyst , orthostatic hypotension on Florinef  and midodrine , possible seizures on Keppra  who presents to the emergency department after he was found unresponsive on the floor by his significant other.  She reports earlier today she found him leaning up against the wall and she checked his blood pressure and it was in the 70s.  She gave him a dose of his midodrine .  She states tonight she heard him fall and went into the room and found him on the floor with snoring respirations.  He is on Keppra  for possible seizures.  She reports compliance.  No recent fevers, cough, vomiting, diarrhea, bloody stool.  Patient is confused here and significant other reports this has been getting worse over the past few months.  He has not been diagnosed with dementia.   History provided by patient, significant other by phone.    Past Medical History:  Diagnosis Date   Acute metabolic encephalopathy 12/02/2020   DKA (diabetic ketoacidoses) 04/06/2016   Hypercholesteremia    Hypertension     Past Surgical History:  Procedure Laterality Date   AMPUTATION TOE Left 04/25/2022   Procedure: LEFT GREAT TOE AMPUTATION AND LEFT PARTIAL 2ND TOE AMPUTATION;  Surgeon: Dot Gazella, DPM;  Location: [facility] ORS;  Service: Podiatry;  Laterality: Left;   COLONOSCOPY WITH PROPOFOL  N/A 02/01/2020   Procedure: COLONOSCOPY WITH PROPOFOL ;  Surgeon: Selena Daily, MD;  Location: [facility] ENDOSCOPY;  Service: Gastroenterology;  Laterality: N/A;   ESOPHAGOGASTRODUODENOSCOPY  02/01/2020   Procedure:  ESOPHAGOGASTRODUODENOSCOPY (EGD);  Surgeon: Selena Daily, MD;  Location: [facility] ENDOSCOPY;  Service: Gastroenterology;;   ESOPHAGOGASTRODUODENOSCOPY (EGD) WITH PROPOFOL  N/A 06/07/2022   Procedure: ESOPHAGOGASTRODUODENOSCOPY (EGD) WITH PROPOFOL ;  Surgeon: Genell Ken, MD;  Location: [facility] ENDOSCOPY;  Service: Gastroenterology;  Laterality: N/A;   KNEE SURGERY Right    Torn meniscus   KNEE SURGERY Left     MEDICATIONS:  Prior to Admission medications   Medication Sig Start Date End Date Taking? Authorizing Provider  acetaminophen  (TYLENOL ) 500 MG tablet Take 1,000 mg by mouth daily as needed for mild pain or moderate pain.    [provider]  acyclovir  (ZOVIRAX ) 400 MG tablet Take 1 tablet (400 mg total) by mouth 2 (two) times daily. 08/13/23 11/11/23  Avonne Boettcher, MD  apixaban  (ELIQUIS ) 5 MG TABS tablet Take 1 tablet (5 mg total) by mouth 2 (two) times daily. Start Eliquis  after 2 weeks which will be 03/02/2023 03/02/23   Althia Atlas, MD  ascorbic acid  (VITAMIN C ) 500 MG tablet Take 1 tablet (500 mg total) by mouth daily. 04/09/22   Amin, Sumayya, MD  atorvastatin  (LIPITOR ) 80 MG tablet Take 1 tablet (80 mg total) by mouth at bedtime. Hold while taking Paxlovid  04/08/22   Luna Salinas, MD  bisacodyl  5 MG EC tablet Take 1 tablet (5 mg total) by mouth at bedtime. Skip the dose if no constipation 02/24/23   Althia Atlas, MD  Continuous Glucose Sensor (DEXCOM G7 SENSOR) MISC 1 each by Does not apply route 4 (four) times daily -  before meals and at bedtime. 04/10/23  Tiajuana Fluke, MD  fludrocortisone  (FLORINEF ) 0.1 MG tablet Take 1 tablet (0.1 mg total) by mouth daily. Skip the dose if SBP >130 02/25/23   Althia Atlas, MD  gabapentin  (NEURONTIN ) 300 MG capsule Take 300 mg by mouth 2 (two) times daily. 03/03/23   [provider]  Insulin  Aspart FlexPen (NOVOLOG ) 100 UNIT/ML Sliding Scale: Glucose 70 - 120: 0 units Glucose 121 - 150: 1 unit Glucose 151 - 200: 2 units Glucose  201 - 250: 3 units Glucose 251 - 300: 5 units Glucose 301 - 350: 7 units Glucose 351 - 400: 9 units Glucose > 400: call your docotr 03/03/23   [provider]  insulin  degludec (TRESIBA  FLEXTOUCH) 100 UNIT/ML FlexTouch Pen Inject 5 Units into the skin daily. Home med. 04/10/23   Tiajuana Fluke, MD  iron  polysaccharides (NIFEREX) 150 MG capsule Take 1 capsule (150 mg total) by mouth daily. 03/20/23 07/21/23  Althia Atlas, MD  levETIRAcetam  (KEPPRA ) 500 MG tablet Take 1 tablet by mouth twice daily 08/09/23   Vaslow, [name] K, MD  Multiple Vitamin (MULTIVITAMIN WITH MINERALS) TABS tablet Take 1 tablet by mouth daily. 04/09/22   Luna Salinas, MD  ondansetron  (ZOFRAN ) 8 MG tablet Take 8 mg by mouth daily as needed for nausea or vomiting.    [provider]  pantoprazole  (PROTONIX ) 40 MG tablet Take 40 mg by mouth daily. 04/11/23   [provider]  polyethylene glycol (MIRALAX ) 17 g packet Take 17 g by mouth 2 (two) times daily. Skip the dose if no constipation 02/24/23   Althia Atlas, MD  pomalidomide  (POMALYST ) 2 MG capsule Take 1 capsule (2 mg total) by mouth daily. Take for 21 days, the hold for 7 days. Repeat every 28 days. 08/13/23   Avonne Boettcher, MD  traZODone  (DESYREL ) 50 MG tablet Take 0.5 tablets (25 mg total) by mouth at bedtime as needed for sleep. 02/24/23   Althia Atlas, MD    Physical Exam   Triage Vital Signs: ED Triage Vitals  Encounter Vitals Group     BP 08/26/23 2358 (!) 161/104     Systolic BP Percentile --      Diastolic BP Percentile --      Pulse Rate 08/26/23 2358 70     Resp 08/26/23 2358 18     Temp 08/26/23 2358 98.4 F (36.9 C)     Temp Source 08/26/23 2358 Oral     SpO2 08/26/23 2358 97 %     Weight 08/26/23 2356 152 lb 1.9 oz (69 kg)     Height 08/26/23 2356 5\' 10"  (1.778 m)     Head Circumference --      Peak Flow --      Pain Score 08/26/23 2356 0     Pain Loc --      Pain Education --      Exclude from Growth Chart --      Most recent vital signs: Vitals:   08/27/23 0211 08/27/23 0214  BP: (!) 154/87 117/68  Pulse: 66 80  Resp: 12 13  Temp:    SpO2: 100% 100%     CONSTITUTIONAL: Alert, elderly.  Oriented to person only.  Confused. HEAD: Normocephalic; superficial laceration just lateral to the right eye with mild surrounding soft tissue swelling EYES: Conjunctivae clear, PERRL, EOMI ENT: normal nose; no rhinorrhea; moist mucous membranes; pharynx without lesions noted; no dental injury; no septal hematoma, no epistaxis; no facial deformity or bony tenderness NECK: Supple, no midline  spinal tenderness, step-off or deformity; trachea midline CARD: RRR; S1 and S2 appreciated; no murmurs, no clicks, no rubs, no gallops RESP: Normal chest excursion without splinting or tachypnea; breath sounds clear and equal bilaterally; no wheezes, no rhonchi, no rales; no hypoxia or respiratory distress CHEST:  chest wall stable, no crepitus or ecchymosis or deformity, nontender to palpation; no flail chest ABD/GI: Non-distended; soft, non-tender, no rebound, no guarding; no ecchymosis or other lesions noted PELVIS:  stable, nontender to palpation BACK:  The back appears normal; no midline spinal tenderness, step-off or deformity EXT: Normal ROM in all joints; no edema; normal capillary refill; no cyanosis, no bony tenderness or bony deformity of patient's extremities, no joint effusions, compartments are soft, extremities are warm and well-perfused, no ecchymosis SKIN: Normal color for age and race; warm NEURO: No facial asymmetry, normal speech, moving all extremities equally, reports normal sensation diffusely  ED Results / Procedures / Treatments   LABS: (all labs ordered are listed, but only abnormal results are displayed) Labs Reviewed  BASIC METABOLIC PANEL WITH GFR - Abnormal; Notable for the following components:      Result Value   Glucose, Bld 119 (*)    BUN 24 (*)    Creatinine, Ser 1.38 (*)     Calcium  7.9 (*)    GFR, Estimated 55 (*)    All other components within normal limits  CBC - Abnormal; Notable for the following components:   WBC 3.2 (*)    RBC 2.51 (*)    Hemoglobin 7.8 (*)    HCT 25.6 (*)    MCV 102.0 (*)    RDW 17.5 (*)    All other components within normal limits  PROTIME-INR - Abnormal; Notable for the following components:   Prothrombin  Time 15.8 (*)    All other components within normal limits  URINALYSIS, ROUTINE W REFLEX MICROSCOPIC  URINE DRUG SCREEN, QUALITATIVE ([facility] ONLY)  LEVETIRACETAM  LEVEL  TROPONIN I (HIGH SENSITIVITY)  TROPONIN I (HIGH SENSITIVITY)     EKG:    Date: 08/27/2023  Rate: 77  Rhythm: normal sinus rhythm with first-degree AV block versus junctional rhythm  QRS Axis: normal  Intervals: normal  ST/T Wave abnormalities: normal  Conduction Disutrbances: none  Narrative Interpretation: Sinus rhythm with first-degree AV block versus junctional rhythm      RADIOLOGY: My personal review and interpretation of imaging: CT cervical spine shows no traumatic injury.  CT head shows acute versus subacute subdural.  I have personally reviewed all radiology reports. CT Cervical Spine Wo Contrast Result Date: 08/27/2023 CLINICAL DATA:  Fall.  Neck trauma (Age >= 65y) EXAM: CT CERVICAL SPINE WITHOUT CONTRAST TECHNIQUE: Multidetector CT imaging of the cervical spine was performed without intravenous contrast. Multiplanar CT image reconstructions were also generated. RADIATION DOSE REDUCTION: This exam was performed according to the departmental dose-optimization program which includes automated exposure control, adjustment of the mA and/or kV according to patient size and/or use of iterative reconstruction technique. COMPARISON:  07/21/2023 FINDINGS: Alignment: Normal Skull base and vertebrae: No acute fracture. No primary bone lesion or focal pathologic process. Soft tissues and spinal canal: No prevertebral fluid or swelling. No visible canal  hematoma. Disc levels: Degenerative disc disease changes at C5-6 and C6-7 with disc space narrowing and spurring. Moderate degenerative facet disease, left greater than right. Upper chest: No acute findings Other: None IMPRESSION: Degenerative changes.  No acute bony abnormality. Electronically Signed   By: Janeece Mechanic M.D.   On: 08/27/2023 00:29   CT  HEAD WO CONTRAST ( ) Result Date: 08/27/2023 CLINICAL DATA:  Head trauma, minor (Age >= 65y).  Fall. EXAM: CT HEAD WITHOUT CONTRAST TECHNIQUE: Contiguous axial images were obtained from the base of the skull through the vertex without intravenous contrast. RADIATION DOSE REDUCTION: This exam was performed according to the departmental dose-optimization program which includes automated exposure control, adjustment of the mA and/or kV according to patient size and/or use of iterative reconstruction technique. COMPARISON:  07/21/2023 FINDINGS: Brain: There is a low-density to isodense subdural fluid collection noted over the left cerebral convexity, new since prior study concerning for isodense subdural hematoma. This measures approximately 5 mm in thickness on coronal image 48, series 4. No mass effect or midline shift. No hydrocephalus. No intracerebral hemorrhage or acute infarction. Again noted are extensive calcifications along the falx and tentorium. Vascular: No hyperdense vessel or unexpected calcification. Skull: No acute calvarial abnormality. Sinuses/Orbits: No acute findings Other: None IMPRESSION: Low-density to isodense subdural fluid collection measuring 5 mm in thickness over the left cerebral convexity concerning for small subdural hematoma. This is age indeterminate but new since prior study suggesting acute or subacute. No mass effect or midline shift. Electronically Signed   By: Janeece Mechanic M.D.   On: 08/27/2023 00:27     PROCEDURES:  Critical Care performed: Yes, see critical care procedure note(s)   CRITICAL CARE Performed by:  Starling Eck Aaliah Jorgenson   Total critical care time: 30 minutes  Critical care time was exclusive of separately billable procedures and treating other patients.  Critical care was necessary to treat or prevent imminent or life-threatening deterioration.  Critical care was time spent personally by me on the following activities: development of treatment plan with patient and/or surrogate as well as nursing, discussions with consultants, evaluation of patient's response to treatment, examination of patient, obtaining history from patient or surrogate, ordering and performing treatments and interventions, ordering and review of laboratory studies, ordering and review of radiographic studies, pulse oximetry and re-evaluation of patient's condition.   Procedures    IMPRESSION / MDM / ASSESSMENT AND PLAN / ED COURSE  I reviewed the triage vital signs and the nursing notes.  Patient here after unwitnessed fall.  Found lying on the floor with sonorous respirations.  History of orthostatic syncope on midodrine  and Florinef  and also history of seizures.  The patient is on the cardiac monitor to evaluate for evidence of arrhythmia and/or significant heart rate changes.   DIFFERENTIAL DIAGNOSIS (includes but not limited to):   Syncope, seizure, ACS, arrhythmia, intracranial hemorrhage, skull fracture, cervical spine fracture, dementia, UTI, anemia, electrolyte derangement  Patient's presentation is most consistent with acute presentation with potential threat to life or bodily function.  PLAN: Workup initiated from triage.  Hemoglobin of 7.8 which appears to be around patient's baseline.  Stable chronic kidney disease.  Normal electrolytes.  Normal glucose.  First troponin negative.  Repeat pending.  Will place patient on cardiac monitoring.  EKG shows junctional rhythm versus normal sinus rhythm with first-degree AV block.  P waves difficult to appreciate.  Will continue to closely monitor.  Patient is  hypertensive here.  CT head and cervical spine reviewed and interpreted by myself and the radiologist and shows a 5 mm left subdural with no shift.  This could be acute versus subacute.  Will repeat head CT in 6 hours.  Will discuss with neurosurgery on-call for consultation.  Given history of seizures with patient being found down with sonorous respirations, will check Keppra  level and give dose  of IV Keppra  here.  I feel patient will need admission to the hospital for further monitoring.   MEDICATIONS GIVEN IN ED: Medications  0.9 %  sodium chloride  infusion ( Intravenous New Bag/Given 08/27/23 0202)  prothrombin complex conc human (KCENTRA) IVPB 3,450 Units (has no administration in time range)  Tdap (BOOSTRIX) injection 0.5 mL (0.5 mLs Intramuscular Given 08/27/23 0135)  bacitracin ointment (1 Application Topical Given 08/27/23 0133)  levETIRAcetam  (KEPPRA ) undiluted injection 1,000 mg (1,000 mg Intravenous Given 08/27/23 0156)     ED COURSE: Dr. Jeris Montes consulted.  He will see patient in consult and agrees with plan.   Discussed with hospitalist.  Will reverse Eliquis  with Kcentra.  Patient not currently in atrial fibrillation.  CONSULTS:  Consulted and discussed patient's case with hospitalist, Dr. Achilles Holes.  I have recommended admission and consulting physician agrees and will place admission orders.  Patient (and family if present) agree with this plan.   I reviewed all nursing notes, vitals, pertinent previous records.  All labs, EKGs, imaging ordered have been independently reviewed and interpreted by myself.    OUTSIDE RECORDS REVIEWED: Reviewed recent cardiology, PCP and oncology notes.       FINAL CLINICAL IMPRESSION(S) / ED DIAGNOSES   Final diagnoses:  Injury of head, initial encounter  Anemia, unspecified type  SDH (subdural hematoma) (HCC)  Loss of consciousness (HCC)     Rx / DC Orders   ED Discharge Orders     None        Note:  This document  was prepared using Dragon voice recognition software and may include unintentional dictation errors.   France Lusty, Clover Dao, DO 08/27/23 0300

## 2023-08-28 ENCOUNTER — Encounter: Payer: Self-pay | Admitting: Internal Medicine

## 2023-08-28 ENCOUNTER — Other Ambulatory Visit: Payer: Self-pay

## 2023-08-28 DIAGNOSIS — S065XAA Traumatic subdural hemorrhage with loss of consciousness status unknown, initial encounter: Secondary | ICD-10-CM | POA: Diagnosis not present

## 2023-08-28 LAB — CBC
HCT: 20 % — ABNORMAL LOW (ref 39.0–52.0)
Hemoglobin: 6.4 g/dL — ABNORMAL LOW (ref 13.0–17.0)
MCH: 30.8 pg (ref 26.0–34.0)
MCHC: 32 g/dL (ref 30.0–36.0)
MCV: 96.2 fL (ref 80.0–100.0)
Platelets: 247 10*3/uL (ref 150–400)
RBC: 2.08 MIL/uL — ABNORMAL LOW (ref 4.22–5.81)
RDW: 17 % — ABNORMAL HIGH (ref 11.5–15.5)
WBC: 2.9 10*3/uL — ABNORMAL LOW (ref 4.0–10.5)
nRBC: 0 % (ref 0.0–0.2)

## 2023-08-28 LAB — GLUCOSE, CAPILLARY
Glucose-Capillary: 292 mg/dL — ABNORMAL HIGH (ref 70–99)
Glucose-Capillary: 269 mg/dL — ABNORMAL HIGH (ref 70–99)
Glucose-Capillary: 338 mg/dL — ABNORMAL HIGH (ref 70–99)
Glucose-Capillary: 277 mg/dL — ABNORMAL HIGH (ref 70–99)
Glucose-Capillary: 121 mg/dL — ABNORMAL HIGH (ref 70–99)
Glucose-Capillary: 194 mg/dL — ABNORMAL HIGH (ref 70–99)

## 2023-08-28 LAB — PREPARE RBC (CROSSMATCH)

## 2023-08-28 LAB — LEVETIRACETAM LEVEL: Levetiracetam Lvl: 31.5 ug/mL (ref 10.0–40.0)

## 2023-08-28 LAB — HIV ANTIBODY (ROUTINE TESTING W REFLEX): HIV Screen 4th Generation wRfx: NONREACTIVE

## 2023-08-28 LAB — HEMOGLOBIN AND HEMATOCRIT, BLOOD
HCT: 23.5 % — ABNORMAL LOW (ref 39.0–52.0)
Hemoglobin: 7.2 g/dL — ABNORMAL LOW (ref 13.0–17.0)

## 2023-08-28 MED ORDER — INSULIN GLARGINE-YFGN 100 UNIT/ML ~~LOC~~ SOLN
5.0000 [IU] | SUBCUTANEOUS | Status: DC
  Administered 2023-08-28 – 2023-08-31 (×4): 5 [IU] via SUBCUTANEOUS
  Filled 2023-08-28 (×4): qty 0.05

## 2023-08-28 MED ORDER — SODIUM CHLORIDE 0.9% IV SOLUTION: Freq: Once | INTRAVENOUS | Status: AC

## 2023-08-28 NOTE — Progress Notes (Signed)
 OT Cancellation Note  Patient Details Name: Nicholas Weiss MRN: [id number] DOB: 1950/10/21   Cancelled Treatment:    Reason Eval/Treat Not Completed: Medical issues which prohibited therapy. Chart reviewed - pt noted to have Hgb 6.4 and pending blood transfusion with history of orthostatics noted. Will hold and initiate services as pt medically appropriate to participate in therapy.   Gordan Latina, M.S. OTR/L  08/28/23, 10:35 AM  ascom [phone]

## 2023-08-28 NOTE — Plan of Care (Signed)
  Problem: Education: Goal: Ability to describe self-care measures that may prevent or decrease complications (Diabetes Survival Skills Education) will improve Outcome: Not Progressing   Problem: Fluid Volume: Goal: Ability to maintain a balanced intake and output will improve Outcome: Progressing   Problem: Health Behavior/Discharge Planning: Goal: Ability to identify and utilize available resources and services will improve Outcome: Not Progressing

## 2023-08-28 NOTE — Progress Notes (Signed)
 PROGRESS NOTE    Nicholas Weiss  UYQ:[id number] DOB: 1950/12/28 DOA: 08/27/2023 PCP: Monique Ano, MD    Brief Narrative:  73 y.o. male with medical history significant of IDDM, diabetic neuropathy, orthostatic hypotension, HTN, chronic HFpEF, mild HOCM, PAF on Eliquis , multiple myeloma on chronic chemotherapy, seizure on Keppra , presented with fall at home.   Patient does not remember what has happened, or history given by phone interviewing with significant other as well as patient's son, both live with patient.  Family reported that patient has been complaining about lightheadedness despite taking daily fludrocortisone  and as needed midodrine .  Significant other reported that patient has had frequent episode of hypotension since last week and had to take midodrine .  Yesterday morning SBP 70s and patient took 1 midodrine .  At night around 1 AM, patient woke up to go to bathroom and fell.  Family member heard a loud thump and went to see the patient and saw him on the floor cold clammy and a few twitching like movement but no overall shakiness.  The episode lasted about 4-5 minutes and patient recovered consciousness himself and was confused for short period time.  There was no tongue biting or loss control of urine or bowel movements.   Assessment & Plan:   Principal Problem:   Subdural hematoma (HCC) Active Problems:   Subdural bleeding (HCC)   SDH (subdural hematoma) (HCC)   Syncope and fall Traumatic SDH - Likely secondary to orthostatic hypotension - Patient already on maximal therapy for orthostatic hypotension, including medications of fludrocortisone  as well as a as needed midodrine .  Appears that the patient also has a baseline hypotension and midodrine  has been only as needed.  I discussed with family regarding the safety of as needed midodrine  versus scheduled midodrine .  And patient has been already on compression stocking as well.  Prognosis is poor given the episode of  frequent symptoms of orthostatic hypotension and frequent falls.  Family made aware.  Also discussed with family regarding the safety benefit and risk of anticoagulation in patient's case, all parties agreed with discontinue anticoagulation. -Repeat head CT showed stabilized SDH picture with mild midline shift.  No neurological deficit, neurosurgery not recommending any intervention.  Will see in clinic 2 weeks Plan: Orthostatics Therapy as tolerated No need for repeat imaging unless any acute neurologic changes are noted  Acute on chronic anemia Chronic anemia of unclear etiology.  Likely multifactorial in nature.  Hemoglobin on 5/31 6.4 Plan: Type and screen Transfuse 1 unit PRBC Posttransfusion H&H   Orthostatic hypotension Twice daily orthostatics.  Florinef  and midodrine    IDDM Basal bolus regimen   Diabetic neuropathy - Continue gabapentin    MM - Continue pomalidomide    PAF - In sinus rhythm - Continue hold Eliquis  for at least 2 weeks   DVT prophylaxis: SCD Code Status: Full Family Communication: None Disposition Plan: Status is: Inpatient Remains inpatient appropriate because: Acute on chronic anemia, subdural hematoma   Level of care: Stepdown  Consultants:  Neurosurgery, signed off  Procedures:  None  Antimicrobials: None   Subjective: Seen and examined.  Sitting up in bed.  No visible distress.  Answers all questions appropriately.  A little hazy on the circumstance surrounding hospital admission.  Is oriented x 3.  Objective: Vitals:   08/28/23 0609 08/28/23 0700 08/28/23 0800 08/28/23 0945  BP: 122/80 (!) 154/89 136/82 113/73  Pulse: 74     Resp: (!) 21 10 11  (!) 8  Temp:      TempSrc:  SpO2: 100%     Weight:      Height:        Intake/Output Summary (Last 24 hours) at 08/28/2023 1128 Last data filed at 08/28/2023 0900 Gross per 24 hour  Intake 1208.96 ml  Output 1100 ml  Net 108.96 ml   Filed Weights   08/26/23 2356 08/27/23  1540 08/28/23 0500  Weight: 69 kg 65.2 kg 65.2 kg    Examination:  General exam: Appears calm and comfortable  Respiratory system: Clear to auscultation. Respiratory effort normal. Cardiovascular system: S1-S2, RRR, no murmurs, trace pedal edema bilaterally Gastrointestinal system: Soft NT/ND, normal bowel sounds Central nervous system: Alert and oriented. No focal neurological deficits. Extremities: Decreased power bilateral lower extremities.  Gait not assessed Skin: Ecchymoses over right eye Psychiatry: Judgement and insight appear normal. Mood & affect appropriate.     Data Reviewed: I have personally reviewed following labs and imaging studies  CBC: Recent Labs  Lab 08/26/23 2357 08/28/23 0356  WBC 3.2* 2.9*  HGB 7.8* 6.4*  HCT 25.6* 20.0*  MCV 102.0* 96.2  PLT 294 247   Basic Metabolic Panel: Recent Labs  Lab 08/26/23 2357 08/27/23 2105  NA 138  --   K 3.8  --   CL 107  --   CO2 24  --   GLUCOSE 119* 383*  BUN 24*  --   CREATININE 1.38*  --   CALCIUM  7.9*  --   MG  --  2.1   GFR: Estimated Creatinine Clearance: 45.9 mL/min (A) (by C-G formula based on SCr of 1.38 mg/dL (H)). Liver Function Tests: No results for input(s): "AST", "ALT", "ALKPHOS", "BILITOT", "PROT", "ALBUMIN" in the last 168 hours. No results for input(s): "LIPASE", "AMYLASE" in the last 168 hours. No results for input(s): "AMMONIA " in the last 168 hours. Coagulation Profile: Recent Labs  Lab 08/26/23 2357  INR 1.2   Cardiac Enzymes: No results for input(s): "CKTOTAL", "CKMB", "CKMBINDEX", "TROPONINI" in the last 168 hours. BNP (last 3 results) No results for input(s): "PROBNP" in the last 8760 hours. HbA1C: No results for input(s): "HGBA1C" in the last 72 hours. CBG: Recent Labs  Lab 08/27/23 2133 08/28/23 0209 08/28/23 0533 08/28/23 0734 08/28/23 1116  GLUCAP 411* 292* 269* 338* 277*   Lipid Profile: No results for input(s): "CHOL", "HDL", "LDLCALC", "TRIG", "CHOLHDL",  "LDLDIRECT" in the last 72 hours. Thyroid  Function Tests: No results for input(s): "TSH", "T4TOTAL", "FREET4", "T3FREE", "THYROIDAB" in the last 72 hours. Anemia Panel: No results for input(s): "VITAMINB12", "FOLATE", "FERRITIN", "TIBC", "IRON ", "RETICCTPCT" in the last 72 hours. Sepsis Labs: No results for input(s): "PROCALCITON", "LATICACIDVEN" in the last 168 hours.  Recent Results (from the past 240 hours)  MRSA Next Gen by PCR, Nasal     Status: None   Collection Time: 08/27/23  3:43 PM   Specimen: Nasal Mucosa; Nasal Swab  Result Value Ref Range Status   MRSA by PCR Next Gen NOT DETECTED NOT DETECTED Final    Comment: (NOTE) The GeneXpert MRSA Assay (FDA approved for NASAL specimens only), is one component of a comprehensive MRSA colonization surveillance program. It is not intended to diagnose MRSA infection nor to guide or monitor treatment for MRSA infections. Test performance is not FDA approved in patients less than 39 years old. Performed at [facility], [address], [place], [place] [address]          Radiology Studies: EEG adult Result Date: 08/27/2023 Arleene Lack, MD     08/27/2023  4:32 PM Patient Name: Nicholas Weiss MRN: [id number] Epilepsy Attending: Arleene Lack Referring Physician/Provider: [place], MD Date: 08/27/2023 Duration: 31.23 mins Patient history: 73yo M with h/o seizures came with syncope. EEG to evaluate for seizure Level of alertness: Awake, asleep AEDs during EEG study: LEV, GBP Technical aspects: This EEG study was done with scalp electrodes positioned according to the 10-20 International system of electrode placement. Electrical activity was reviewed with band pass filter of 1-70Hz , sensitivity of 7 uV/mm, display speed of 37mm/sec with a 60Hz  notched filter applied as appropriate. EEG data were recorded continuously and digitally stored.  Video monitoring was available and reviewed as appropriate. Description: The  posterior dominant rhythm consists of 7.5 Hz activity of moderate voltage (25-35 uV) seen predominantly in posterior head regions, symmetric and reactive to eye opening and eye closing. Sleep was characterized by sleep spindles (12 to 14 Hz), maximal frontocentral region. EEG showed continuous generalized 5 to 7 Hz theta slowing admixed with intermittent generalized 2-3 Hz delta slowing, at times with triphasic morphology. Hyperventilation and photic stimulation were not performed.    ABNORMALITY - Continuous slow, generalized  IMPRESSION: This study is suggestive of moderate diffuse encephalopathy. No seizures or epileptiform discharges were seen throughout the recording.  Priyanka Suzanne Erps    CT HEAD WO CONTRAST ( ) Result Date: 08/27/2023 CLINICAL DATA:  73 year old male status post fall with hypodense left subdural hematoma. EXAM: CT HEAD WITHOUT CONTRAST TECHNIQUE: Contiguous axial images were obtained from the base of the skull through the vertex without intravenous contrast. RADIATION DOSE REDUCTION: This exam was performed according to the departmental dose-optimization program which includes automated exposure control, adjustment of the mA and/or kV according to patient size and/or use of iterative reconstruction technique. COMPARISON:  Head CT 0015 hours today. FINDINGS: Brain: Redemonstrated left side subdural hematoma, hyperdense blood products noted along the left frontal operculum level on coronal image 34. Left SDH thickness is 4-5 mm and not significantly changed. Smaller contralateral and mostly superior convexity low-density right side subdural hematoma also noted, increased in conspicuity but small measuring 2-3 mm (coronal image 45). Also there is hyperdense para falcine subdural blood, small volume (coronal image 34) up to 3 mm and stable superimposed on dural calcification. Trace rightward midline shift (series 6, image 15). Ventricle size and configuration remains normal. No IVH. No  subarachnoid or intra-axial blood. Stable gray-white matter differentiation throughout the brain. No cortically based acute infarct identified. Vascular: Calcified atherosclerosis at the skull base. No suspicious intracranial vascular hyperdensity. Skull: Stable.  No skull fracture identified. Sinuses/Orbits: Visualized paranasal sinuses and mastoids are stable and well aerated. Other: Leftward gaze now. No acute orbit or scalp soft tissue injury is identified. IMPRESSION: 1. Positive for small Bilateral Subdural Hematomas: - right side mixed density SDH is 4-5 mm thickness and not significantly changed from 0015 hours. - left SDH is low-density and smaller, 2-3 mm, more conspicuous since the prior exam. - small hyperdense right parafalcine SDH is stable, 3 mm. 2. No skull fracture identified. No significant intracranial mass effect at this time, trace rightward midline shift. Electronically Signed   By: Marlise Simpers M.D.   On: 08/27/2023 06:19   CT Cervical Spine Wo Contrast Result Date: 08/27/2023 CLINICAL DATA:  Fall.  Neck trauma (Age >= 65y) EXAM: CT CERVICAL SPINE WITHOUT CONTRAST TECHNIQUE: Multidetector CT imaging of the cervical spine was performed without intravenous contrast. Multiplanar CT image reconstructions were also generated. RADIATION DOSE REDUCTION: This exam was performed according  to the departmental dose-optimization program which includes automated exposure control, adjustment of the mA and/or kV according to patient size and/or use of iterative reconstruction technique. COMPARISON:  07/21/2023 FINDINGS: Alignment: Normal Skull base and vertebrae: No acute fracture. No primary bone lesion or focal pathologic process. Soft tissues and spinal canal: No prevertebral fluid or swelling. No visible canal hematoma. Disc levels: Degenerative disc disease changes at C5-6 and C6-7 with disc space narrowing and spurring. Moderate degenerative facet disease, left greater than right. Upper chest: No acute  findings Other: None IMPRESSION: Degenerative changes.  No acute bony abnormality. Electronically Signed   By: Janeece Mechanic M.D.   On: 08/27/2023 00:29   CT HEAD WO CONTRAST ( ) Result Date: 08/27/2023 CLINICAL DATA:  Head trauma, minor (Age >= 65y).  Fall. EXAM: CT HEAD WITHOUT CONTRAST TECHNIQUE: Contiguous axial images were obtained from the base of the skull through the vertex without intravenous contrast. RADIATION DOSE REDUCTION: This exam was performed according to the departmental dose-optimization program which includes automated exposure control, adjustment of the mA and/or kV according to patient size and/or use of iterative reconstruction technique. COMPARISON:  07/21/2023 FINDINGS: Brain: There is a low-density to isodense subdural fluid collection noted over the left cerebral convexity, new since prior study concerning for isodense subdural hematoma. This measures approximately 5 mm in thickness on coronal image 48, series 4. No mass effect or midline shift. No hydrocephalus. No intracerebral hemorrhage or acute infarction. Again noted are extensive calcifications along the falx and tentorium. Vascular: No hyperdense vessel or unexpected calcification. Skull: No acute calvarial abnormality. Sinuses/Orbits: No acute findings Other: None IMPRESSION: Low-density to isodense subdural fluid collection measuring 5 mm in thickness over the left cerebral convexity concerning for small subdural hematoma. This is age indeterminate but new since prior study suggesting acute or subacute. No mass effect or midline shift. Electronically Signed   By: Janeece Mechanic M.D.   On: 08/27/2023 00:27        Scheduled Meds:  sodium chloride    Intravenous Once   acyclovir   400 mg Oral BID   atorvastatin   80 mg Oral QHS   bisacodyl   5 mg Oral QHS   Chlorhexidine  Gluconate Cloth  6 each Topical Daily   fludrocortisone   0.1 mg Oral Daily   gabapentin   300 mg Oral BID   insulin  aspart  0-15 Units Subcutaneous TID  WC   insulin  aspart  0-5 Units Subcutaneous QHS   insulin  glargine-yfgn  5 Units Subcutaneous Q24H   levETIRAcetam   500 mg Oral BID   midodrine   2.5 mg Oral TID WC   pantoprazole   40 mg Oral Daily   polyethylene glycol  17 g Oral BID   sodium chloride  flush  3 mL Intravenous Q12H   Continuous Infusions:  sodium chloride  50 mL/hr at 08/27/23 2054     LOS: 1 day     Tiajuana Fluke, MD [facility]   If 7PM-7AM, please contact night-coverage  08/28/2023, 11:28 AM

## 2023-08-28 NOTE — Progress Notes (Incomplete)
 Patient's hemoglobin is 6.4, NP

## 2023-08-28 NOTE — Progress Notes (Signed)
 PT Cancellation Note  Patient Details Name: Nicholas Weiss MRN: [id number] DOB: 27-Apr-1950   Cancelled Treatment:    Reason Eval/Treat Not Completed: Medical issues which prohibited therapy. Pt pending transfusion, PT to attempt as able.   Darien Eden PT, DPT 10:30 AM,08/28/23

## 2023-08-28 NOTE — Plan of Care (Signed)
  Problem: Coping: Goal: Ability to adjust to condition or change in health will improve Outcome: Progressing   Problem: Nutritional: Goal: Maintenance of adequate nutrition will improve Outcome: Progressing   Problem: Education: Goal: Knowledge of condition and prescribed therapy will improve Outcome: Progressing

## 2023-08-29 DIAGNOSIS — S065XAA Traumatic subdural hemorrhage with loss of consciousness status unknown, initial encounter: Secondary | ICD-10-CM | POA: Diagnosis not present

## 2023-08-29 LAB — CBC WITH DIFFERENTIAL/PLATELET
Abs Immature Granulocytes: 0 10*3/uL (ref 0.00–0.07)
Basophils Absolute: 0 10*3/uL (ref 0.0–0.1)
Basophils Relative: 0 %
Eosinophils Absolute: 0.1 10*3/uL (ref 0.0–0.5)
Eosinophils Relative: 4 %
HCT: 26.2 % — ABNORMAL LOW (ref 39.0–52.0)
Hemoglobin: 8.8 g/dL — ABNORMAL LOW (ref 13.0–17.0)
Immature Granulocytes: 0 %
Lymphocytes Relative: 24 %
Lymphs Abs: 0.9 10*3/uL (ref 0.7–4.0)
MCH: 30.8 pg (ref 26.0–34.0)
MCHC: 33.6 g/dL (ref 30.0–36.0)
MCV: 91.6 fL (ref 80.0–100.0)
Monocytes Absolute: 0.2 10*3/uL (ref 0.1–1.0)
Monocytes Relative: 7 %
Neutro Abs: 2.3 10*3/uL (ref 1.7–7.7)
Neutrophils Relative %: 65 %
Platelets: 271 10*3/uL (ref 150–400)
RBC: 2.86 MIL/uL — ABNORMAL LOW (ref 4.22–5.81)
RDW: 18.2 % — ABNORMAL HIGH (ref 11.5–15.5)
WBC: 3.6 10*3/uL — ABNORMAL LOW (ref 4.0–10.5)
nRBC: 0 % (ref 0.0–0.2)

## 2023-08-29 LAB — GLUCOSE, CAPILLARY
Glucose-Capillary: 266 mg/dL — ABNORMAL HIGH (ref 70–99)
Glucose-Capillary: 227 mg/dL — ABNORMAL HIGH (ref 70–99)
Glucose-Capillary: 217 mg/dL — ABNORMAL HIGH (ref 70–99)
Glucose-Capillary: 83 mg/dL (ref 70–99)
Glucose-Capillary: 152 mg/dL — ABNORMAL HIGH (ref 70–99)

## 2023-08-29 LAB — BASIC METABOLIC PANEL WITH GFR
Anion gap: 3 — ABNORMAL LOW (ref 5–15)
BUN: 24 mg/dL — ABNORMAL HIGH (ref 8–23)
CO2: 29 mmol/L (ref 22–32)
Calcium: 8 mg/dL — ABNORMAL LOW (ref 8.9–10.3)
Chloride: 103 mmol/L (ref 98–111)
Creatinine, Ser: 1.1 mg/dL (ref 0.61–1.24)
GFR, Estimated: >60 mL/min (ref >60)
Glucose, Bld: 243 mg/dL — ABNORMAL HIGH (ref 70–99)
Potassium: 4.1 mmol/L (ref 3.5–5.1)
Sodium: 135 mmol/L (ref 135–145)

## 2023-08-29 NOTE — Progress Notes (Signed)
 Occupational Therapy Evaluation Patient Details Name: Nicholas Weiss MRN: [id number] DOB: 06-02-1950 Today's Date: 08/29/2023   History of Present Illness   73 y.o. male with medical history significant of IDDM, diabetic neuropathy, orthostatic hypotension, HTN, chronic HFpEF, mild HOCM, PAF on Eliquis , multiple myeloma on chronic chemotherapy, seizure on Keppra , presented with fall at home.     Clinical Impressions Pt was seen for OT/PT evaluation this date. Prior to hospital admission, per chart review from previous admission (08/17/23); pt reports his wife assists with ADLs/IADLs, significant other does the cooking; Pt is not currently driving. Pt reports he lives with his wife and son and at baseline ambulates with a cane. Pt presents to acute OT demonstrating impaired ADL performance and functional mobility 2/2 (See OT problem list for additional functional deficits). Pt currently requires supervision for bed mobility, CGA +2 for safety for transfer and mobility with use of a rolling walker. Pt required MODA for compression socks while seated at EOB, MAXA for donning abdominal binder. Pt donned bilateral non-slip socks, requiring extra time to complete with no physical assistance. Pt amb within room/hallway with ~177ft with no LOB noted. 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 Pt would benefit from skilled OT services to address noted impairments and functional limitations (see below for any additional details) in order to maximize safety and independence while minimizing falls risk and caregiver burden. OT will follow acutely.  Orthostatic vitals (No reports of dizziness or lightheadedness during session) Sitting BP:126/71,  3 min Standing BP: 71/54  Seated BP in chair: 105/66       If plan is discharge home, recommend the following:   A lot of help with walking and/or transfers;A lot of help with bathing/dressing/bathroom;Assistance with  cooking/housework;Supervision due to cognitive status;Assist for transportation     Functional Status Assessment   Patient has had a recent decline in their functional status and demonstrates the ability to make significant improvements in function in a reasonable and predictable amount of time.     Equipment Recommendations   None recommended by OT     Recommendations for Other Services         Precautions/Restrictions   Precautions Precautions: Fall Recall of Precautions/Restrictions: Impaired Precaution/Restrictions Comments: Hx of multiple falls Restrictions Weight Bearing Restrictions Per Provider Order: No     Mobility Bed Mobility Overal bed mobility: Needs Assistance Bed Mobility: Supine to Sit     Supine to sit: Supervision     General bed mobility comments: No reported dizziness with bed mobility    Transfers Overall transfer level: Needs assistance Equipment used: Rolling walker (2 wheels) Transfers: Sit to/from Stand Sit to Stand: Contact guard assist, +2 safety/equipment           General transfer comment: Amb within room/hallway ~18ft  with RW and CGA +2 for safety, no LOB noted      Balance Overall balance assessment: Needs assistance Sitting-balance support: Feet supported Sitting balance-Leahy Scale: Good Sitting balance - Comments: Steady reaching within BOS   Standing balance support: Bilateral upper extremity supported, Reliant on assistive device for balance Standing balance-Leahy Scale: Fair                             ADL either performed or assessed with clinical judgement   ADL Overall ADL's : Needs assistance/impaired     Grooming: Set up;Wash/dry face;Wash/dry hands;Sitting;Oral care Grooming Details (indicate cue type and  reason): Sitting in recliner         Upper Body Dressing : Maximal assistance;Sitting Upper Body Dressing Details (indicate cue type and reason): Don abdomial binder Lower Body  Dressing: Maximal assistance;Modified independent Lower Body Dressing Details (indicate cue type and reason): MODA for compression socks, extra time to don bilateral regular socks with MODI Toilet Transfer: Ambulation;Rolling walker (2 wheels);Contact guard assist;+2 for safety/equipment Toilet Transfer Details (indicate cue type and reason): Simulated toilet t/f         Functional mobility during ADLs: Contact guard assist;+2 for safety/equipment;Rolling walker (2 wheels) General ADL Comments: MODI donning bilateral socks while seated on the EOB, MAXA donning abdominal binder     Vision Baseline Vision/History: 1 Wears glasses                         Pertinent Vitals/Pain Pain Assessment Pain Assessment: No/denies pain     Extremity/Trunk Assessment Upper Extremity Assessment Upper Extremity Assessment: Generalized weakness   Lower Extremity Assessment Lower Extremity Assessment: Defer to PT evaluation;Generalized weakness   Cervical / Trunk Assessment Cervical / Trunk Assessment: Normal   Communication Communication Communication: No apparent difficulties   Cognition Arousal: Alert Behavior During Therapy: WFL for tasks assessed/performed Cognition: No family/caregiver present to determine baseline             OT - Cognition Comments: Forgetfull of conversations with had during start of session, poor carry over of education of abdominal binder/compression socks                 Following commands: Intact Following commands impaired: Follows one step commands with increased time     Cueing  General Comments   Cueing Techniques: Verbal cues;Gestural cues      Exercises Exercises: Other exercises Other Exercises Other Exercises: Edu: Role of OT eval, benefits of compression socks and abdominal binder, safe ADL completion   Shoulder Instructions      Home Living Family/patient expects to be discharged to:: Private residence Living  Arrangements: Spouse/significant other Available Help at Discharge: Family;Available 24 hours/day Type of Home: House Home Access: Stairs to enter [employer] of Steps: 1 Entrance Stairs-Rails: Can reach both Home Layout: One level     Bathroom Shower/Tub: Producer, television/film/video: Standard     Home Equipment: Cane - quad;Shower seat;Grab bars - toilet;Grab bars - tub/shower;Cane - single Librarian, academic (2 wheels)   Additional Comments: Pt reports he uses a regular wooden cane;      Prior Functioning/Environment Prior Level of Function : Needs assist;History of Falls (last six months)             Mobility Comments: pt reports he walks with his cane ADLs Comments: Per chart review from previous admission (08/17/23); pt reports his wife assists with ADLs/IADLs, significant other does the cooking; Pt is not currently driving    OT Problem List: Decreased activity tolerance;Impaired balance (sitting and/or standing)   OT Treatment/Interventions: Self-care/ADL training;Energy conservation;Patient/family education      OT Goals(Current goals can be found in the care plan section)   Acute Rehab OT Goals Patient Stated Goal: No more falls OT Goal Formulation: With patient Time For Goal Achievement: 09/12/23 Potential to Achieve Goals: Good ADL Goals Pt Will Perform Grooming: with modified independence;standing Pt Will Perform Lower Body Dressing: with modified independence;sit to/from stand Pt Will Transfer to Toilet: with modified independence;ambulating Pt Will Perform Toileting - Clothing Manipulation and hygiene: with modified independence  OT Frequency:  Min 2X/week    Co-evaluation PT/OT/SLP Co-Evaluation/Treatment: Yes Reason for Co-Treatment: Complexity of the patient's impairments (multi-system involvement);For patient/therapist safety PT goals addressed during session: Mobility/safety with mobility OT goals addressed during session:  ADL's and self-care      AM-PAC OT "6 Clicks" Daily Activity     Outcome Measure Help from another person eating meals?: None Help from another person taking care of personal grooming?: A Little Help from another person toileting, which includes using toliet, bedpan, or urinal?: A Little Help from another person bathing (including washing, rinsing, drying)?: A Little Help from another person to put on and taking off regular upper body clothing?: A Little Help from another person to put on and taking off regular lower body clothing?: None 6 Click Score: 20   End of Session Equipment Utilized During Treatment: Gait belt;Rolling walker (2 wheels) Nurse Communication: Mobility status  Activity Tolerance: Patient tolerated treatment well Patient left: in chair;with call bell/phone within reach;with chair alarm set  OT Visit Diagnosis: Unsteadiness on feet (R26.81);Other abnormalities of gait and mobility (R26.89)                Time: 1025-1050 OT Time Calculation (min): 25 min Charges:  OT General Charges $OT Visit: 1 Visit OT Evaluation $OT Eval Moderate Complexity: 1 Mod OT Treatments $Self Care/Home Management : 8-22 mins  Rosaria Common M.S. OTR/L  08/29/23, 12:15 PM

## 2023-08-29 NOTE — Evaluation (Signed)
 Physical Therapy Evaluation Patient Details Name: Nicholas Weiss MRN: [id number] DOB: [birth year] Today's Date: 08/29/2023  History of Present Illness  73 y.o. male with medical history significant of IDDM, diabetic neuropathy, orthostatic hypotension, HTN, chronic HFpEF, mild HOCM, PAF on Eliquis , multiple myeloma on chronic chemotherapy, seizure on Keppra , presented with fall at home.   Clinical Impression  Patient alert, oriented x2, seen by PT/OT to maximize safety. Ted hoses and abdominal binder donned in sitting, maxA for both. BP monitored and pt was orthostatic after ambulating (71/54, RN notified) but pt without symptoms, and recovered to 105/66 resting in recliner. He was able to transfer with RW and CGA, and ambulated ~79ft, Cgax2. Pt/chart review confirmed at baseline he lives with family, though pt endorsed use of SPC vs. RW.  Overall the patient demonstrated deficits (see "PT Problem List") that impede the patient's functional abilities, safety, and mobility and would benefit from skilled PT intervention.          If plan is discharge home, recommend the following: A little help with walking and/or transfers;A little help with bathing/dressing/bathroom;Assistance with cooking/housework;Assist for transportation;Help with stairs or ramp for entrance   Can travel by private vehicle        Equipment Recommendations Other (comment) (Pt reported having a SPC and RW at home)  Recommendations for Other Services       Functional Status Assessment Patient has had a recent decline in their functional status and demonstrates the ability to make significant improvements in function in a reasonable and predictable amount of time.     Precautions / Restrictions Precautions Precautions: Fall Recall of Precautions/Restrictions: Impaired Precaution/Restrictions Comments: Hx of multiple falls Restrictions Weight Bearing Restrictions Per Provider Order: No      Mobility  Bed  Mobility Overal bed mobility: Needs Assistance Bed Mobility: Supine to Sit     Supine to sit: Supervision Sit to supine: Supervision, Used rails   General bed mobility comments: No reported dizziness with bed mobility    Transfers Overall transfer level: Needs assistance Equipment used: Rolling walker (2 wheels) Transfers: Sit to/from Stand Sit to Stand: Contact guard assist, +2 safety/equipment                Ambulation/Gait Ambulation/Gait assistance: Contact guard assist, +2 safety/equipment Gait Distance (Feet): 90 Feet Assistive device: Rolling walker (2 wheels)            Stairs            Wheelchair Mobility     Tilt Bed    Modified Rankin (Stroke Patients Only)       Balance Overall balance assessment: Needs assistance Sitting-balance support: Feet supported Sitting balance-Leahy Scale: Good     Standing balance support: Bilateral upper extremity supported, Reliant on assistive device for balance Standing balance-Leahy Scale: Fair                               Pertinent Vitals/Pain Pain Assessment Pain Assessment: No/denies pain    Home Living Family/patient expects to be discharged to:: Private residence Living Arrangements: Spouse/significant other Available Help at Discharge: Family;Available 24 hours/day Type of Home: House Home Access: Stairs to enter Entrance Stairs-Rails: Can reach both Entrance Stairs-Number of Steps: 1   Home Layout: One level Home Equipment: Cane - quad;Shower seat;Grab bars - toilet;Grab bars - tub/shower;Cane - single Librarian, academic (2 wheels) Additional Comments: Pt reports he uses a regular wooden cane;  Prior Function Prior Level of Function : Needs assist;History of Falls (last six months)             Mobility Comments: pt reports he walks with his cane ADLs Comments: Per chart review from previous admission (08/17/23); pt reports his wife assists with ADLs/IADLs,  significant other does the cooking; Pt is not currently driving     Extremity/Trunk Assessment   Upper Extremity Assessment Upper Extremity Assessment: Generalized weakness    Lower Extremity Assessment Lower Extremity Assessment: Defer to PT evaluation;Generalized weakness    Cervical / Trunk Assessment Cervical / Trunk Assessment: Normal  Communication   Communication Communication: No apparent difficulties    Cognition Arousal: Alert Behavior During Therapy: WFL for tasks assessed/performed   PT - Cognitive impairments: History of cognitive impairments                       PT - Cognition Comments: oriented to self and place Following commands: Intact Following commands impaired: Follows one step commands with increased time     Cueing Cueing Techniques: Verbal cues, Gestural cues, Tactile cues     General Comments      Exercises     Assessment/Plan    PT Assessment Patient needs continued PT services  PT Problem List Decreased strength;Decreased activity tolerance;Decreased balance;Decreased mobility;Decreased cognition;Decreased knowledge of use of DME;Decreased safety awareness       PT Treatment Interventions DME instruction;Balance training;Gait training;Stair training;Functional mobility training;Patient/family education;Therapeutic activities;Therapeutic exercise    PT Goals (Current goals can be found in the Care Plan section)  Acute Rehab PT Goals Patient Stated Goal: to go home. PT Goal Formulation: With patient Time For Goal Achievement: 09/12/23 Potential to Achieve Goals: Fair    Frequency Min 2X/week     Co-evaluation   Reason for Co-Treatment: Complexity of the patient's impairments (multi-system involvement);For patient/therapist safety PT goals addressed during session: Mobility/safety with mobility OT goals addressed during session: ADL's and self-care       AM-PAC PT "6 Clicks" Mobility  Outcome Measure Help needed  turning from your back to your side while in a flat bed without using bedrails?: None Help needed moving from lying on your back to sitting on the side of a flat bed without using bedrails?: None Help needed moving to and from a bed to a chair (including a wheelchair)?: None Help needed standing up from a chair using your arms (e.g., wheelchair or bedside chair)?: None Help needed to walk in hospital room?: A Little Help needed climbing 3-5 steps with a railing? : A Little 6 Click Score: 22    End of Session Equipment Utilized During Treatment: Gait belt Activity Tolerance: Patient tolerated treatment well Patient left: in chair;with call bell/phone within reach;with chair alarm set Nurse Communication: Mobility status PT Visit Diagnosis: Unsteadiness on feet (R26.81);Repeated falls (R29.6);Muscle weakness (generalized) (M62.81);Difficulty in walking, not elsewhere classified (R26.2)    Time: 1610-9604 PT Time Calculation (min) (ACUTE ONLY): 22 min   Charges:   PT Evaluation $PT Eval Moderate Complexity: 1 Mod PT Treatments $Therapeutic Activity: 8-22 mins PT General Charges $$ ACUTE PT VISIT: 1 Visit        Darien Eden PT, DPT 12:36 PM,08/29/23

## 2023-08-29 NOTE — TOC Initial Note (Signed)
 Transition of Care [facility]) - Initial/Assessment Note    Patient Details  Name: Nicholas Weiss MRN: [id number] Date of Birth: [birth year]  [facility] CM/SW Contact:    Alexandra Ice, RN Phone Number: 08/29/2023, 2:29 PM  Clinical Narrative:                  Met with patient at bedside, discussed discharge plan. He lives with Cyndy Dresser, significant other. He is unable to recall if home health services came to see him after previous discharge.  Per chart review, patient was set up with [facility].  TOC will continue to follow for discharge needs.   Barriers to Discharge: Continued Medical Work up   Patient Goals and CMS Choice            Expected Discharge Plan and Services       Living arrangements for the past 2 months: Single Family Home                                      Prior Living Arrangements/Services Living arrangements for the past 2 months: Single Family Home Lives with:: Significant Other Cyndy Dresser) Patient language and need for interpreter reviewed:: Yes        Need for Family Participation in Patient Care: Yes (Comment) Care giver support system in place?: Yes (comment) Current home services: DME (Rolling Walker (2 wheels);Cane - quad;Shower seat;Grab bars - toilet;Grab bars - tub/shower;Cane - single point) Criminal Activity/Legal Involvement Pertinent to Current Situation/Hospitalization: No - Comment as needed  Activities of Daily Living   ADL Screening (condition at time of admission) Independently performs ADLs?: No Does the patient have a NEW difficulty with bathing/dressing/toileting/self-feeding that is expected to last >3 days?: Yes (Initiates electronic notice to provider for possible OT consult) Does the patient have a NEW difficulty with getting in/out of bed, walking, or climbing stairs that is expected to last >3 days?: Yes (Initiates electronic notice to provider for possible PT consult) Does the  patient have a NEW difficulty with communication that is expected to last >3 days?: No Is the patient deaf or have difficulty hearing?: No Does the patient have difficulty seeing, even when wearing glasses/contacts?: No Does the patient have difficulty concentrating, remembering, or making decisions?: No  Permission Sought/Granted                  Emotional Assessment Appearance:: Appears older than stated age Attitude/Demeanor/Rapport: Lethargic Affect (typically observed): Calm, Appropriate Orientation: : Oriented to Self, Oriented to Place, Oriented to Situation Alcohol / Substance Use: Not Applicable Psych Involvement: No (comment)  Admission diagnosis:  Loss of consciousness (HCC) [R40.20] Subdural hematoma (HCC) [S06.5XAA] SDH (subdural hematoma) (HCC) [S06.5XAA] Subdural bleeding (HCC) [I62.00] Injury of head, initial encounter [S09.90XA] Anemia, unspecified type [D64.9] Patient Active Problem List   Diagnosis Date Noted   Subdural bleeding (HCC) 08/27/2023   SDH (subdural hematoma) (HCC) 08/27/2023   MCI (mild cognitive impairment) with memory loss 08/18/2023   DKA (diabetic ketoacidosis) (HCC) 08/15/2023   High anion gap metabolic acidosis 08/15/2023   Prolonged QT interval 07/22/2023   Uncontrolled type 1 diabetes mellitus with hypoglycemia, with long-term current use of insulin  (HCC) 04/05/2023   Hyperosmolar hyperglycemic state (HHS) (HCC) 03/26/2023   Iron  deficiency anemia 03/26/2023   Seizure disorder (HCC) 03/26/2023   Systemic inflammatory response syndrome (SIRS) associated with organ dysfunction (HCC) 03/26/2023  NSTEMI (non-ST elevated myocardial infarction) (HCC) 03/09/2023   Elevated troponin 03/08/2023   Paroxysmal atrial fibrillation (HCC) 03/08/2023   Chronic anticoagulation 03/08/2023   ICH (intracerebral hemorrhage) (HCC) 02/16/2023   Uncontrolled diabetes mellitus with hyperglycemia (HCC) 02/16/2023   GERD without esophagitis 02/16/2023    Hypertension 02/16/2023   Diabetic neuropathy (HCC) 02/16/2023   Intraventricular hemorrhage (HCC) 02/16/2023   Myoclonus 01/15/2023   Hypothermia 12/29/2022   Dizziness 11/13/2022   Substance abuse (HCC) 09/18/2022   History of GI bleed 09/18/2022   History of intraventricular hemorrhage following syncopal event 02/15/2023 09/18/2022   Uncontrolled type 1 diabetes mellitus with hyperglycemia, with long-term current use of insulin  (HCC) 08/26/2022   Postural dizziness with presyncope 08/18/2022   Near syncope 08/18/2022   Closed fracture of shaft of left humerus 06/18/2022   Malnutrition of moderate degree 06/10/2022   Hypotension 06/10/2022   GI bleed 06/06/2022   Subdural hematoma (HCC) 05/15/2022   Falls 05/15/2022   Shoulder pain, left 05/15/2022   History of amputation of left great toe (HCC) 05/04/2022   Hypophosphatemia 04/28/2022   Protein-calorie malnutrition, severe 04/24/2022   Diabetic ulcer of toe associated with diabetes mellitus due to underlying condition, with fat layer exposed (HCC) 04/24/2022   Streptococcal bacteremia 04/24/2022   Leukopenia 04/24/2022   History of multiple myeloma 04/24/2022   Dysautonomia orthostatic hypotension with recurrent syncope 04/24/2022   Weight loss 04/23/2022   Osteomyelitis of left foot (HCC) 04/22/2022   DKA, type 1 (HCC) 04/22/2022   Dehydration 04/08/2022   Diabetic ketoacidosis without coma associated with type 1 diabetes mellitus (HCC) 04/07/2022   Hyperkalemia 04/07/2022   Traumatic pneumothorax 02/18/2022   Multiple fractures of ribs, left side, initial encounter for closed fracture 02/18/2022   Pneumothorax, closed, traumatic 02/18/2022   Benign prostatic hyperplasia with nocturia 02/12/2022   Stroke (cerebrum) (HCC) 11/07/2021   Multiple myeloma (HCC) 09/29/2021   Multiple myeloma not having achieved remission (HCC) 09/29/2021   Hyponatremia 08/18/2021   COVID-19 virus infection 08/13/2021   History of CVA  (cerebrovascular accident) 08/13/2021   Syncope and collapse, recurrent 08/09/2021   Hepatic lesion 08/05/2021   Dyslipidemia 05/08/2021   History of substance abuse (HCC) 12/09/2020   Acute metabolic encephalopathy 12/02/2020   Cocaine abuse (HCC) 11/30/2020   Muscle twitching 09/12/2020   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   Pre-syncope 08/01/2020   Heart block AV first degree 08/01/2020   Anemia of chronic kidney failure, stage 3 (moderate) (HCC) 05/28/2020   PUD (peptic ulcer disease)    Esophageal dysphagia    Encounter for screening colonoscopy    Hyperglycemia 07/28/2019   Generalized weakness 07/28/2019   Hypoglycemia 04/27/2019   Unresponsiveness 04/27/2019   Lung nodule 04/07/2019   Acute renal failure superimposed on stage 3a chronic kidney disease (HCC)    Hypertensive urgency 04/05/2019   AKI (acute kidney injury) (HCC) 04/05/2019   CKD (chronic kidney disease) stage 3, GFR 30-59 ml/min (HCC) 04/05/2019   Hyperlipidemia 01/24/2016   Type 1 diabetes mellitus with diabetic neuropathy, unspecified (HCC) 05/31/2006   PCP:  Monique Ano, MD Pharmacy:   [facility] [address]), [place] - [address] [place]) [place] [address] Phone: [phone] Fax: [phone]  [facility] - [place], [place] - [address] [place] [address] Phone: [phone] Fax: [phone]  [facility] - [place], [place] - [address] [place] [address] Phone: [phone] Fax:  [phone]  [facility] [address] [place] [address] Phone: [phone] Fax: [phone]     Social Drivers of Health (SDOH) Social History: SDOH Screenings   Food Insecurity: No Food Insecurity (08/28/2023)  Housing: Low Risk  (08/28/2023)  Transportation Needs: No Transportation Needs (08/28/2023)  Utilities: Not  At Risk (08/28/2023)  Alcohol Screen: Low Risk  (10/02/2020)  Depression (PHQ2-9): Medium Risk (10/02/2020)  Financial Resource Strain: Low Risk  (05/19/2023)   Received from [facility]  Physical Activity: Insufficiently Active (01/01/2020)  Social Connections: Patient Declined (08/28/2023)  Stress: No Stress Concern Present (01/01/2020)  Tobacco Use: Low Risk  (08/28/2023)  Recent Concern: Tobacco Use - Medium Risk (08/13/2023)   Received from [facility]   SDOH Interventions:     Readmission Risk Interventions    02/16/2023    3:56 PM 08/24/2022   10:57 AM 05/20/2022   11:41 AM  Readmission Risk Prevention Plan  Transportation Screening Complete Complete Complete  Medication Review Oceanographer) Complete Complete Complete  PCP or Specialist appointment within 3-5 days of discharge Complete Complete Complete  HRI or Home Care Consult Complete Complete Complete  SW Recovery Care/Counseling Consult Not Complete    Palliative Care Screening Not Applicable  Complete  Skilled Nursing Facility Complete Complete Complete

## 2023-08-29 NOTE — Progress Notes (Signed)
 PROGRESS NOTE    Nicholas Weiss  UJW:[id number] DOB: [birth year] DOA: 08/27/2023 PCP: Monique Ano, MD    Brief Narrative:  73 y.o. male with medical history significant of IDDM, diabetic neuropathy, orthostatic hypotension, HTN, chronic HFpEF, mild HOCM, PAF on Eliquis , multiple myeloma on chronic chemotherapy, seizure on Keppra , presented with fall at home.   Patient does not remember what has happened, or history given by phone interviewing with significant other as well as patient's son, both live with patient.  Family reported that patient has been complaining about lightheadedness despite taking daily fludrocortisone  and as needed midodrine .  Significant other reported that patient has had frequent episode of hypotension since last week and had to take midodrine .  Yesterday morning SBP 70s and patient took 1 midodrine .  At night around 1 AM, patient woke up to go to bathroom and fell.  Family member heard a loud thump and went to see the patient and saw him on the floor cold clammy and a few twitching like movement but no overall shakiness.  The episode lasted about 4-5 minutes and patient recovered consciousness himself and was confused for short period time.  There was no tongue biting or loss control of urine or bowel movements.   Assessment & Plan:   Principal Problem:   Subdural hematoma (HCC) Active Problems:   Subdural bleeding (HCC)   SDH (subdural hematoma) (HCC)   Syncope and fall Traumatic SDH - Likely secondary to orthostatic hypotension - Patient already on maximal therapy for orthostatic hypotension, including medications of fludrocortisone  as well as a as needed midodrine .  Appears that the patient also has a baseline hypotension and midodrine  has been only as needed.  I discussed with family regarding the safety of as needed midodrine  versus scheduled midodrine .  And patient has been already on compression stocking as well.  Prognosis is poor given the episode of  frequent symptoms of orthostatic hypotension and frequent falls.  Family made aware.  Also discussed with family regarding the safety benefit and risk of anticoagulation in patient's case, all parties agreed with discontinue anticoagulation. -Repeat head CT showed stabilized SDH picture with mild midline shift.  No neurological deficit, neurosurgery not recommending any intervention.  Will see in clinic 2 weeks Plan: Daily orthostatics Abd binder and TED hose Therapy as tolerated No need for repeat imaging unless any acute neurologic changes are noted Hb is stable  Acute on chronic anemia Chronic anemia of unclear etiology.  Likely multifactorial in nature.  Hemoglobin on 5/31 6.4 Plan: Hb stable Defer transfusion at this time   Orthostatic hypotension BID orthostatics. Florinef  Midodrine    IDDM Continue Basal bolus regimen   Diabetic neuropathy Continue gabapentin    MM Continue pomalidomide    PAF - In sinus rhythm Continue to hold Eliquis  for at least 2 weeks   DVT prophylaxis: SCD Code Status: Full Family Communication: None Disposition Plan: Status is: Inpatient Remains inpatient appropriate because: Acute on chronic anemia, subdural hematoma   Level of care: Telemetry Medical  Consultants:  Neurosurgery, signed off  Procedures:  None  Antimicrobials: None   Subjective: Seen and examined.  Hemoglobin stable.  No further complaints this morning.  Objective: Vitals:   08/28/23 2031 08/29/23 0436 08/29/23 0500 08/29/23 0758  BP: (!) 170/98 (!) 161/94  (!) 162/93  Pulse: 66 68  62  Resp: 16 20  18   Temp: 98 F (36.7 C) 98.1 F (36.7 C)  98.3 F (36.8 C)  TempSrc: Oral Oral    SpO2: 100%  100%  99%  Weight:   66.8 kg   Height:        Intake/Output Summary (Last 24 hours) at 08/29/2023 1011 Last data filed at 08/29/2023 0847 Gross per 24 hour  Intake 630 ml  Output 150 ml  Net 480 ml   Filed Weights   08/28/23 0500 08/28/23 1600 08/29/23 0500   Weight: 65.2 kg 67 kg 66.8 kg    Examination:  General exam: NAD Respiratory system: Lungs clear.  Normal work of breathing.  Room air Cardiovascular system: S1-S2, regular rate, regular rhythm, no murmurs, no pedal edema Gastrointestinal system: Soft NT/ND, normal bowel sounds Central nervous system: Alert and oriented. No focal neurological deficits. Extremities: Decreased power bilateral lower extremities.  Gait not assessed Skin: Ecchymoses over right eye Psychiatry: Judgement and insight appear normal. Mood & affect appropriate.     Data Reviewed: I have personally reviewed following labs and imaging studies  CBC: Recent Labs  Lab 08/26/23 2357 08/28/23 0356 08/28/23 0929 08/29/23 0831  WBC 3.2* 2.9*  --  3.6*  NEUTROABS  --   --   --  2.3  HGB 7.8* 6.4* 7.2* 8.8*  HCT 25.6* 20.0* 23.5* 26.2*  MCV 102.0* 96.2  --  91.6  PLT 294 247  --  271   Basic Metabolic Panel: Recent Labs  Lab 08/26/23 2357 08/27/23 2105 08/29/23 0831  NA 138  --  135  K 3.8  --  4.1  CL 107  --  103  CO2 24  --  29  GLUCOSE 119* 383* 243*  BUN 24*  --  24*  CREATININE 1.38*  --  1.10  CALCIUM  7.9*  --  8.0*  MG  --  2.1  --    GFR: Estimated Creatinine Clearance: 59 mL/min (by C-G formula based on SCr of 1.1 mg/dL). Liver Function Tests: No results for input(s): "AST", "ALT", "ALKPHOS", "BILITOT", "PROT", "ALBUMIN" in the last 168 hours. No results for input(s): "LIPASE", "AMYLASE" in the last 168 hours. No results for input(s): "AMMONIA " in the last 168 hours. Coagulation Profile: Recent Labs  Lab 08/26/23 2357  INR 1.2   Cardiac Enzymes: No results for input(s): "CKTOTAL", "CKMB", "CKMBINDEX", "TROPONINI" in the last 168 hours. BNP (last 3 results) No results for input(s): "PROBNP" in the last 8760 hours. HbA1C: No results for input(s): "HGBA1C" in the last 72 hours. CBG: Recent Labs  Lab 08/28/23 1116 08/28/23 1803 08/28/23 2125 08/29/23 0456 08/29/23 0756   GLUCAP 277* 121* 194* 266* 227*   Lipid Profile: No results for input(s): "CHOL", "HDL", "LDLCALC", "TRIG", "CHOLHDL", "LDLDIRECT" in the last 72 hours. Thyroid  Function Tests: No results for input(s): "TSH", "T4TOTAL", "FREET4", "T3FREE", "THYROIDAB" in the last 72 hours. Anemia Panel: No results for input(s): "VITAMINB12", "FOLATE", "FERRITIN", "TIBC", "IRON ", "RETICCTPCT" in the last 72 hours. Sepsis Labs: No results for input(s): "PROCALCITON", "LATICACIDVEN" in the last 168 hours.  Recent Results (from the past 240 hours)  MRSA Next Gen by PCR, Nasal     Status: None   Collection Time: 08/27/23  3:43 PM   Specimen: Nasal Mucosa; Nasal Swab  Result Value Ref Range Status   MRSA by PCR Next Gen NOT DETECTED NOT DETECTED Final    Comment: (NOTE) The GeneXpert MRSA Assay (FDA approved for NASAL specimens only), is one component of a comprehensive MRSA colonization surveillance program. It is not intended to diagnose MRSA infection nor to guide or monitor treatment for MRSA infections. Test performance is not FDA approved in  patients less than 50 years old. Performed at [facility], [address] [place], [place] [address]          Radiology Studies: EEG adult Result Date: 08/27/2023 Arleene Lack, MD     08/27/2023  4:32 PM Patient Name: Nicholas Weiss MRN: [id number] Epilepsy Attending: Arleene Lack Referring Physician/Provider: [place], MD Date: 08/27/2023 Duration: 31.23 mins Patient history: 73yo M with h/o seizures came with syncope. EEG to evaluate for seizure Level of alertness: Awake, asleep AEDs during EEG study: LEV, GBP Technical aspects: This EEG study was done with scalp electrodes positioned according to the 10-20 International system of electrode placement. Electrical activity was reviewed with band pass filter of 1-70Hz , sensitivity of 7 uV/mm, display speed of 55mm/sec with a 60Hz  notched filter applied as appropriate. EEG data were  recorded continuously and digitally stored.  Video monitoring was available and reviewed as appropriate. Description: The posterior dominant rhythm consists of 7.5 Hz activity of moderate voltage (25-35 uV) seen predominantly in posterior head regions, symmetric and reactive to eye opening and eye closing. Sleep was characterized by sleep spindles (12 to 14 Hz), maximal frontocentral region. EEG showed continuous generalized 5 to 7 Hz theta slowing admixed with intermittent generalized 2-3 Hz delta slowing, at times with triphasic morphology. Hyperventilation and photic stimulation were not performed.    ABNORMALITY - Continuous slow, generalized  IMPRESSION: This study is suggestive of moderate diffuse encephalopathy. No seizures or epileptiform discharges were seen throughout the recording.  Priyanka O Yadav         Scheduled Meds:  acyclovir   400 mg Oral BID   atorvastatin   80 mg Oral QHS   bisacodyl   5 mg Oral QHS   fludrocortisone   0.1 mg Oral Daily   gabapentin   300 mg Oral BID   insulin  aspart  0-15 Units Subcutaneous TID WC   insulin  aspart  0-5 Units Subcutaneous QHS   insulin  glargine-yfgn  5 Units Subcutaneous Q24H   levETIRAcetam   500 mg Oral BID   midodrine   2.5 mg Oral TID WC   pantoprazole   40 mg Oral Daily   polyethylene glycol  17 g Oral BID   sodium chloride  flush  3 mL Intravenous Q12H   Continuous Infusions:     LOS: 2 days     Tiajuana Fluke, MD [facility]   If 7PM-7AM, please contact night-coverage  08/29/2023, 10:11 AM

## 2023-08-29 NOTE — Plan of Care (Signed)
  Problem: Education: Goal: Ability to describe self-care measures that may prevent or decrease complications (Diabetes Survival Skills Education) will improve Outcome: Progressing   Problem: Coping: Goal: Ability to adjust to condition or change in health will improve Outcome: Progressing   Problem: Health Behavior/Discharge Planning: Goal: Ability to identify and utilize available resources and services will improve Outcome: Progressing Goal: Ability to manage health-related needs will improve Outcome: Progressing   Problem: Nutritional: Goal: Maintenance of adequate nutrition will improve Outcome: Progressing

## 2023-08-29 NOTE — Plan of Care (Signed)
  Problem: Activity: Goal: Risk for activity intolerance will decrease Outcome: Progressing   Problem: Nutrition: Goal: Adequate nutrition will be maintained Outcome: Progressing   Problem: Pain Managment: Goal: General experience of comfort will improve and/or be controlled Outcome: Progressing   Problem: Safety: Goal: Ability to remain free from injury will improve Outcome: Progressing   Problem: Skin Integrity: Goal: Risk for impaired skin integrity will decrease Outcome: Progressing

## 2023-08-30 ENCOUNTER — Inpatient Hospital Stay

## 2023-08-30 ENCOUNTER — Telehealth: Payer: Self-pay | Admitting: Neurosurgery

## 2023-08-30 DIAGNOSIS — S065XAA Traumatic subdural hemorrhage with loss of consciousness status unknown, initial encounter: Secondary | ICD-10-CM

## 2023-08-30 LAB — CBC WITH DIFFERENTIAL/PLATELET
Abs Immature Granulocytes: 0.03 10*3/uL (ref 0.00–0.07)
Basophils Absolute: 0 10*3/uL (ref 0.0–0.1)
Basophils Relative: 0 %
Eosinophils Absolute: 0.1 10*3/uL (ref 0.0–0.5)
Eosinophils Relative: 2 %
HCT: 28.7 % — ABNORMAL LOW (ref 39.0–52.0)
Hemoglobin: 9.1 g/dL — ABNORMAL LOW (ref 13.0–17.0)
Immature Granulocytes: 0 %
Lymphocytes Relative: 20 %
Lymphs Abs: 1.4 10*3/uL (ref 0.7–4.0)
MCH: 30.7 pg (ref 26.0–34.0)
MCHC: 31.7 g/dL (ref 30.0–36.0)
MCV: 97 fL (ref 80.0–100.0)
Monocytes Absolute: 0.5 10*3/uL (ref 0.1–1.0)
Monocytes Relative: 7 %
Neutro Abs: 4.7 10*3/uL (ref 1.7–7.7)
Neutrophils Relative %: 71 %
Platelets: 297 10*3/uL (ref 150–400)
RBC: 2.96 MIL/uL — ABNORMAL LOW (ref 4.22–5.81)
RDW: 18.3 % — ABNORMAL HIGH (ref 11.5–15.5)
WBC: 6.7 10*3/uL (ref 4.0–10.5)
nRBC: 0 % (ref 0.0–0.2)

## 2023-08-30 LAB — BASIC METABOLIC PANEL WITH GFR
Anion gap: 11 (ref 5–15)
BUN: 25 mg/dL — ABNORMAL HIGH (ref 8–23)
CO2: 23 mmol/L (ref 22–32)
Calcium: 8.1 mg/dL — ABNORMAL LOW (ref 8.9–10.3)
Chloride: 105 mmol/L (ref 98–111)
Creatinine, Ser: 1.55 mg/dL — ABNORMAL HIGH (ref 0.61–1.24)
GFR, Estimated: 48 mL/min — ABNORMAL LOW (ref >60)
Glucose, Bld: 109 mg/dL — ABNORMAL HIGH (ref 70–99)
Potassium: 4.2 mmol/L (ref 3.5–5.1)
Sodium: 139 mmol/L (ref 135–145)

## 2023-08-30 LAB — GLUCOSE, CAPILLARY
Glucose-Capillary: 223 mg/dL — ABNORMAL HIGH (ref 70–99)
Glucose-Capillary: 182 mg/dL — ABNORMAL HIGH (ref 70–99)
Glucose-Capillary: 43 mg/dL — CL (ref 70–99)
Glucose-Capillary: 48 mg/dL — ABNORMAL LOW (ref 70–99)
Glucose-Capillary: 92 mg/dL (ref 70–99)
Glucose-Capillary: 243 mg/dL — ABNORMAL HIGH (ref 70–99)

## 2023-08-30 MED ORDER — HYDRALAZINE HCL 20 MG/ML IJ SOLN
10.0000 mg | Freq: Four times a day (QID) | INTRAMUSCULAR | Status: DC | PRN
  Administered 2023-08-30: 10 mg via INTRAVENOUS
  Filled 2023-08-30: qty 1

## 2023-08-30 NOTE — Telephone Encounter (Signed)
 Head CT has been placed. I will send a message to scheduling once he has been discharged.

## 2023-08-30 NOTE — Progress Notes (Signed)
 Occupational Therapy Treatment Patient Details Name: Nicholas Weiss MRN: [id number] DOB: Aug 18, 1950 Today's Date: 08/30/2023   History of present illness 73 y.o. male with medical history significant of IDDM, diabetic neuropathy, orthostatic hypotension, HTN, chronic HFpEF, mild HOCM, PAF on Eliquis , multiple myeloma on chronic chemotherapy, seizure on Keppra , presented with fall at home.   OT comments  Nicholas Weiss was seen for OT treatment on this date. Upon arrival to room pt in bed, agreeable to tx. Pt requires CGA + RW standing grooming task and ADL t/f ~90 ft. MOD A don compression socks and abdominal binder in sitting. Pt making good progress toward goals, will continue to follow POC. Discharge recommendation updated to reflect progress.       If plan is discharge home, recommend the following:  Assistance with cooking/housework;Supervision due to cognitive status;Assist for transportation;A little help with walking and/or transfers;A little help with bathing/dressing/bathroom   Equipment Recommendations  None recommended by OT    Recommendations for Other Services      Precautions / Restrictions Precautions Precautions: Fall Recall of Precautions/Restrictions: Impaired Precaution/Restrictions Comments: Hx of multiple falls Restrictions Weight Bearing Restrictions Per Provider Order: No       Mobility Bed Mobility Overal bed mobility: Needs Assistance Bed Mobility: Supine to Sit     Supine to sit: Supervision          Transfers Overall transfer level: Needs assistance Equipment used: Rolling walker (2 wheels) Transfers: Sit to/from Stand Sit to Stand: Contact guard assist                 Balance Overall balance assessment: Needs assistance Sitting-balance support: Feet supported Sitting balance-Leahy Scale: Good     Standing balance support: During functional activity, Single extremity supported Standing balance-Leahy Scale: Fair                              ADL either performed or assessed with clinical judgement   ADL Overall ADL's : Needs assistance/impaired                                       General ADL Comments: CGA + RW standing grooming task and ADL t/f ~90 ft.    Extremity/Trunk Assessment              Vision       Restaurant manager, fast food Communication: No apparent difficulties   Cognition Arousal: Alert Behavior During Therapy: WFL for tasks assessed/performed Cognition: History of cognitive impairments                               Following commands: Intact Following commands impaired: Follows one step commands with increased time      Cueing   Cueing Techniques: Verbal cues, Gestural cues, Tactile cues  Exercises      Shoulder Instructions       General Comments      Pertinent Vitals/ Pain       Pain Assessment Pain Assessment: No/denies pain  Home Living  Prior Functioning/Environment              Frequency  Min 2X/week        Progress Toward Goals  OT Goals(current goals can now be found in the care plan section)  Progress towards OT goals: Progressing toward goals  Acute Rehab OT Goals Patient Stated Goal: to go home OT Goal Formulation: With patient Time For Goal Achievement: 09/12/23 Potential to Achieve Goals: Good ADL Goals Pt Will Perform Grooming: with modified independence;standing Pt Will Perform Lower Body Dressing: with modified independence;sit to/from stand Pt Will Transfer to Toilet: with modified independence;ambulating Pt Will Perform Toileting - Clothing Manipulation and hygiene: with modified independence  Plan      Co-evaluation                 AM-PAC OT "6 Clicks" Daily Activity     Outcome Measure   Help from another person eating meals?: None Help from another person taking care of personal grooming?:  A Little Help from another person toileting, which includes using toliet, bedpan, or urinal?: A Little Help from another person bathing (including washing, rinsing, drying)?: A Little Help from another person to put on and taking off regular upper body clothing?: A Little Help from another person to put on and taking off regular lower body clothing?: None 6 Click Score: 20    End of Session Equipment Utilized During Treatment: Gait belt;Rolling walker (2 wheels)  OT Visit Diagnosis: Unsteadiness on feet (R26.81);Other abnormalities of gait and mobility (R26.89)   Activity Tolerance Patient tolerated treatment well   Patient Left in chair;with call bell/phone within reach;with chair alarm set   Nurse Communication          Time: 1000-1012 OT Time Calculation (min): 12 min  Charges: OT General Charges $OT Visit: 1 Visit OT Treatments $Self Care/Home Management : 8-22 mins  Gordan Latina, M.S. OTR/L  08/30/23, 12:35 PM  ascom [phone]

## 2023-08-30 NOTE — Telephone Encounter (Signed)
 Patient is still currently admitted in the hospital.

## 2023-08-30 NOTE — Plan of Care (Signed)
  Problem: Education: Goal: Ability to describe self-care measures that may prevent or decrease complications (Diabetes Survival Skills Education) will improve Outcome: Progressing   Problem: Coping: Goal: Ability to adjust to condition or change in health will improve Outcome: Progressing   Problem: Fluid Volume: Goal: Ability to maintain a balanced intake and output will improve Outcome: Progressing   Problem: Health Behavior/Discharge Planning: Goal: Ability to identify and utilize available resources and services will improve Outcome: Progressing Goal: Ability to manage health-related needs will improve Outcome: Progressing   Problem: Metabolic: Goal: Ability to maintain appropriate glucose levels will improve Outcome: Progressing   Problem: Nutritional: Goal: Maintenance of adequate nutrition will improve Outcome: Progressing Goal: Progress toward achieving an optimal weight will improve Outcome: Progressing   Problem: Skin Integrity: Goal: Risk for impaired skin integrity will decrease Outcome: Progressing   Problem: Tissue Perfusion: Goal: Adequacy of tissue perfusion will improve Outcome: Progressing   Problem: Education: Goal: Knowledge of condition and prescribed therapy will improve Outcome: Progressing   Problem: Cardiac: Goal: Will achieve and/or maintain adequate cardiac output Outcome: Progressing   Problem: Physical Regulation: Goal: Complications related to the disease process, condition or treatment will be avoided or minimized Outcome: Progressing   Problem: Education: Goal: Knowledge of General Education information will improve Description: Including pain rating scale, medication(s)/side effects and non-pharmacologic comfort measures Outcome: Progressing   Problem: Health Behavior/Discharge Planning: Goal: Ability to manage health-related needs will improve Outcome: Progressing   Problem: Clinical Measurements: Goal: Ability to maintain  clinical measurements within normal limits will improve Outcome: Progressing Goal: Will remain free from infection Outcome: Progressing Goal: Diagnostic test results will improve Outcome: Progressing Goal: Respiratory complications will improve Outcome: Progressing Goal: Cardiovascular complication will be avoided Outcome: Progressing   Problem: Activity: Goal: Risk for activity intolerance will decrease Outcome: Progressing   Problem: Nutrition: Goal: Adequate nutrition will be maintained Outcome: Progressing   Problem: Coping: Goal: Level of anxiety will decrease Outcome: Progressing   Problem: Elimination: Goal: Will not experience complications related to bowel motility Outcome: Progressing Goal: Will not experience complications related to urinary retention Outcome: Progressing   Problem: Pain Managment: Goal: General experience of comfort will improve and/or be controlled Outcome: Progressing   Problem: Safety: Goal: Ability to remain free from injury will improve Outcome: Progressing   Problem: Skin Integrity: Goal: Risk for impaired skin integrity will decrease Outcome: Progressing

## 2023-08-30 NOTE — Progress Notes (Signed)
 Physical Therapy Treatment Patient Details Name: Nicholas Weiss MRN: [id number] DOB: 09-Jun-1950 Today's Date: 08/30/2023   History of Present Illness Pt is a 73 y.o. male with medical history significant of IDDM, diabetic neuropathy, orthostatic hypotension, HTN, chronic HFpEF, mild HOCM, PAF on Eliquis , multiple myeloma on chronic chemotherapy, seizure on Keppra , presented with fall at home.  MD assessment includes: syncope and fall, traumatic SDH, acute on chronic anemia, and orthostatic hypotension.    PT Comments  Pt was pleasant and motivated to participate during the session and put forth good effort throughout.  Pt with abdominal binder and ted hose donned during the session.  Orthostatic vital signs as follow with BP and (HR): Supine 100/57 (54), sitting 95/64 (70), standing 0 min 68/53 (68) and asymptomatic, standing 3 min 75/55 (59) and asymptomatic, back to supine at end of session 96/74 (60).  Pt was asymptomatic throughout the session despite positional drop in BP and was able to do marching in place and amb near th the EOB for safety between standing BPs with no overt LOB or adverse symptoms.  Pt limited primarily by orthostatic BPs at this time.  Pt will benefit from continued PT services upon discharge to safely address deficits listed in patient problem list for decreased caregiver assistance and eventual return to PLOF.        If plan is discharge home, recommend the following: A little help with walking and/or transfers;A little help with bathing/dressing/bathroom;Assistance with cooking/housework;Assist for transportation;Help with stairs or ramp for entrance   Can travel by private vehicle        Equipment Recommendations  None recommended by PT    Recommendations for Other Services       Precautions / Restrictions Precautions Precautions: Fall Precaution/Restrictions Comments: Hx of multiple falls, orthostatic hypotension Restrictions Weight Bearing Restrictions Per  Provider Order: No     Mobility  Bed Mobility Overal bed mobility: Modified Independent             General bed mobility comments: Min extra time and effort only, no physical assist needed    Transfers Overall transfer level: Needs assistance Equipment used: Rolling walker (2 wheels) Transfers: Sit to/from Stand Sit to Stand: Contact guard assist           General transfer comment: Min extra effort to come to standing but good eccentric control and no overt LOB on initial stand    Ambulation/Gait Ambulation/Gait assistance: Contact guard assist Gait Distance (Feet): 30 Feet Assistive device: Rolling walker (2 wheels) Gait Pattern/deviations: Step-through pattern, Trunk flexed       General Gait Details: Pt ambulated near EOB for safety between 0 min and 3 min standing BPs with no overt LOB or adverse symptoms   Stairs             Wheelchair Mobility     Tilt Bed    Modified Rankin (Stroke Patients Only)       Balance Overall balance assessment: Needs assistance Sitting-balance support: Feet supported Sitting balance-Leahy Scale: Good     Standing balance support: Bilateral upper extremity supported, During functional activity Standing balance-Leahy Scale: Fair                              Hotel manager: No apparent difficulties  Cognition Arousal: Alert Behavior During Therapy: WFL for tasks assessed/performed   PT - Cognitive impairments: History of cognitive impairments  Following commands: Intact Following commands impaired: Follows one step commands with increased time    Cueing Cueing Techniques: Verbal cues, Tactile cues  Exercises Total Joint Exercises Long Arc Quad: AROM, Strengthening, Both, 10 reps, 15 reps Marching in Standing: AROM, Strengthening, Both, 20 reps, Standing    General Comments        Pertinent Vitals/Pain Pain Assessment Pain  Assessment: 0-10 Pain Score: 8  Pain Location: R shoulder/chest, more with movement than at rest Pain Descriptors / Indicators: Aching, Sore Pain Intervention(s): Repositioned, Monitored during session, Premedicated before session    Home Living                          Prior Function            PT Goals (current goals can now be found in the care plan section) Progress towards PT goals: Not progressing toward goals - comment (limited by orthostasis)    Frequency    Min 2X/week      PT Plan      Co-evaluation              AM-PAC PT "6 Clicks" Mobility   Outcome Measure  Help needed turning from your back to your side while in a flat bed without using bedrails?: None Help needed moving from lying on your back to sitting on the side of a flat bed without using bedrails?: None Help needed moving to and from a bed to a chair (including a wheelchair)?: A Little Help needed standing up from a chair using your arms (e.g., wheelchair or bedside chair)?: A Little Help needed to walk in hospital room?: A Little Help needed climbing 3-5 steps with a railing? : A Little 6 Click Score: 20    End of Session Equipment Utilized During Treatment: Gait belt Activity Tolerance: Patient tolerated treatment well Patient left: in bed;with call bell/phone within reach;with bed alarm set Nurse Communication: Mobility status;Other (comment) (orthostatic BPs to nsg and MD per above) PT Visit Diagnosis: Unsteadiness on feet (R26.81);Repeated falls (R29.6);Muscle weakness (generalized) (M62.81);Difficulty in walking, not elsewhere classified (R26.2)     Time: 1336-1400 PT Time Calculation (min) (ACUTE ONLY): 24 min  Charges:    $Gait Training: 8-22 mins $Therapeutic Activity: 8-22 mins PT General Charges $$ ACUTE PT VISIT: 1 Visit                     D. Scott Janaiah Vetrano PT, DPT 08/30/23, 3:02 PM

## 2023-08-30 NOTE — Progress Notes (Signed)
 Dr Mosetta Areola made aware that CCMD reports pt in afib, Per MD controlled rate, no new orders at this time

## 2023-08-30 NOTE — Telephone Encounter (Signed)
 Nicholas Munch, MD  [facility] Admin Needs repeat ct in 2 weeks with follow up appointment either 15 minutes with me or with one of the Pas

## 2023-08-30 NOTE — Care Management Important Message (Signed)
 Important Message  Patient Details  Name: Nicholas Weiss MRN: [id number] Date of Birth: 05-10-1950   Important Message Given:  Yes - Medicare IM     Kingston Grosser W, CMA 08/30/2023, 2:11 PM

## 2023-08-30 NOTE — Progress Notes (Signed)
 PROGRESS NOTE    Nicholas Weiss  HYQ:[id number] DOB: 1950-07-19 DOA: 08/27/2023 PCP: Monique Ano, MD    Brief Narrative:  73 y.o. male with medical history significant of IDDM, diabetic neuropathy, orthostatic hypotension, HTN, chronic HFpEF, mild HOCM, PAF on Eliquis , multiple myeloma on chronic chemotherapy, seizure on Keppra , presented with fall at home.   Patient does not remember what has happened, or history given by phone interviewing with significant other as well as patient's son, both live with patient.  Family reported that patient has been complaining about lightheadedness despite taking daily fludrocortisone  and as needed midodrine .  Significant other reported that patient has had frequent episode of hypotension since last week and had to take midodrine .  Yesterday morning SBP 70s and patient took 1 midodrine .  At night around 1 AM, patient woke up to go to bathroom and fell.  Family member heard a loud thump and went to see the patient and saw him on the floor cold clammy and a few twitching like movement but no overall shakiness.  The episode lasted about 4-5 minutes and patient recovered consciousness himself and was confused for short period time.  There was no tongue biting or loss control of urine or bowel movements.   Assessment & Plan:   Principal Problem:   Subdural hematoma (HCC) Active Problems:   Subdural bleeding (HCC)   SDH (subdural hematoma) (HCC)   Syncope and fall Traumatic SDH - Likely secondary to orthostatic hypotension - Patient already on maximal therapy for orthostatic hypotension, including medications of fludrocortisone  as well as a as needed midodrine .  Appears that the patient also has a baseline hypotension and midodrine  has been only as needed.  I discussed with family regarding the safety of as needed midodrine  versus scheduled midodrine .  And patient has been already on compression stocking as well.  Prognosis is poor given the episode of  frequent symptoms of orthostatic hypotension and frequent falls.  Family made aware.  Also discussed with family regarding the safety benefit and risk of anticoagulation in patient's case, all parties agreed with discontinue anticoagulation. -Repeat head CT showed stabilized SDH picture with mild midline shift.  No neurological deficit, neurosurgery not recommending any intervention.  Will see in clinic 2 weeks Plan: Regular orthostatics.  Regular contact with therapy.  Abdominal binder and TED hose with any ambulation efforts.  Acute on chronic anemia Chronic anemia of unclear etiology.  Likely multifactorial in nature.  Hemoglobin on 5/31 6.4 Plan: Hb stable No indication for transfusion currently   Orthostatic hypotension BID orthostatics. Florinef  Midodrine  TED hose and abdominal binder   IDDM Continue Basal bolus regimen   Diabetic neuropathy Continue gabapentin    MM Continue pomalidomide    PAF - In sinus rhythm Continue to hold Eliquis  for at least 2 weeks   DVT prophylaxis: SCD Code Status: Full Family Communication: None Disposition Plan: Status is: Inpatient Remains inpatient appropriate because: Acute on chronic anemia, subdural hematoma   Level of care: Telemetry Medical  Consultants:  Neurosurgery, signed off  Procedures:  None  Antimicrobials: None   Subjective: Seen and examined.  No bleeding.  Still markedly symptomatic on ambulation..  Objective: Vitals:   08/30/23 0350 08/30/23 0411 08/30/23 0748 08/30/23 1135  BP:  136/89 110/75 (!) 82/52  Pulse:  91 70 (!) 55  Resp:  16 16 16   Temp:  98.3 F (36.8 C) 98 F (36.7 C) (!) 97.5 F (36.4 C)  TempSrc:  Oral  Oral  SpO2:  98% 100% 98%  Weight: 66.3 kg     Height:        Intake/Output Summary (Last 24 hours) at 08/30/2023 1550 Last data filed at 08/30/2023 1409 Gross per 24 hour  Intake 360 ml  Output 200 ml  Net 160 ml   Filed Weights   08/28/23 1600 08/29/23 0500 08/30/23 0350   Weight: 67 kg 66.8 kg 66.3 kg    Examination:  General exam: No acute distress Respiratory system: Lungs clear.  Normal work of breathing.  Room air Cardiovascular system: S1-2, bradycardic, irregular rhythm, no murmurs Gastrointestinal system: Soft NT/ND, normal bowel sounds Central nervous system: Alert and oriented. No focal neurological deficits. Extremities: Decreased power bilateral lower extremities.  Gait not assessed Skin: Ecchymoses over right eye Psychiatry: Judgement and insight appear normal. Mood & affect appropriate.     Data Reviewed: I have personally reviewed following labs and imaging studies  CBC: Recent Labs  Lab 08/26/23 2357 08/28/23 0356 08/28/23 0929 08/29/23 0831  WBC 3.2* 2.9*  --  3.6*  NEUTROABS  --   --   --  2.3  HGB 7.8* 6.4* 7.2* 8.8*  HCT 25.6* 20.0* 23.5* 26.2*  MCV 102.0* 96.2  --  91.6  PLT 294 247  --  271   Basic Metabolic Panel: Recent Labs  Lab 08/26/23 2357 08/27/23 2105 08/29/23 0831  NA 138  --  135  K 3.8  --  4.1  CL 107  --  103  CO2 24  --  29  GLUCOSE 119* 383* 243*  BUN 24*  --  24*  CREATININE 1.38*  --  1.10  CALCIUM  7.9*  --  8.0*  MG  --  2.1  --    GFR: Estimated Creatinine Clearance: 58.6 mL/min (by C-G formula based on SCr of 1.1 mg/dL). Liver Function Tests: No results for input(s): "AST", "ALT", "ALKPHOS", "BILITOT", "PROT", "ALBUMIN" in the last 168 hours. No results for input(s): "LIPASE", "AMYLASE" in the last 168 hours. No results for input(s): "AMMONIA " in the last 168 hours. Coagulation Profile: Recent Labs  Lab 08/26/23 2357  INR 1.2   Cardiac Enzymes: No results for input(s): "CKTOTAL", "CKMB", "CKMBINDEX", "TROPONINI" in the last 168 hours. BNP (last 3 results) No results for input(s): "PROBNP" in the last 8760 hours. HbA1C: No results for input(s): "HGBA1C" in the last 72 hours. CBG: Recent Labs  Lab 08/29/23 1209 08/29/23 1623 08/29/23 1936 08/30/23 0749 08/30/23 1147   GLUCAP 217* 83 152* 223* 182*   Lipid Profile: No results for input(s): "CHOL", "HDL", "LDLCALC", "TRIG", "CHOLHDL", "LDLDIRECT" in the last 72 hours. Thyroid  Function Tests: No results for input(s): "TSH", "T4TOTAL", "FREET4", "T3FREE", "THYROIDAB" in the last 72 hours. Anemia Panel: No results for input(s): "VITAMINB12", "FOLATE", "FERRITIN", "TIBC", "IRON ", "RETICCTPCT" in the last 72 hours. Sepsis Labs: No results for input(s): "PROCALCITON", "LATICACIDVEN" in the last 168 hours.  Recent Results (from the past 240 hours)  MRSA Next Gen by PCR, Nasal     Status: None   Collection Time: 08/27/23  3:43 PM   Specimen: Nasal Mucosa; Nasal Swab  Result Value Ref Range Status   MRSA by PCR Next Gen NOT DETECTED NOT DETECTED Final    Comment: (NOTE) The GeneXpert MRSA Assay (FDA approved for NASAL specimens only), is one component of a comprehensive MRSA colonization surveillance program. It is not intended to diagnose MRSA infection nor to guide or monitor treatment for MRSA infections. Test performance is not FDA approved in patients less than 71 years old.  Performed at [facility], [address] [place], [place] [address]          Radiology Studies: No results found.       Scheduled Meds:  acyclovir   400 mg Oral BID   atorvastatin   80 mg Oral QHS   bisacodyl   5 mg Oral QHS   fludrocortisone   0.1 mg Oral Daily   gabapentin   300 mg Oral BID   insulin  aspart  0-15 Units Subcutaneous TID WC   insulin  aspart  0-5 Units Subcutaneous QHS   insulin  glargine-yfgn  5 Units Subcutaneous Q24H   levETIRAcetam   500 mg Oral BID   midodrine   2.5 mg Oral TID WC   pantoprazole   40 mg Oral Daily   polyethylene glycol  17 g Oral BID   sodium chloride  flush  3 mL Intravenous Q12H   Continuous Infusions:     LOS: 3 days     Tiajuana Fluke, MD [facility]   If 7PM-7AM, please contact night-coverage  08/30/2023, 3:50 PM

## 2023-08-31 DIAGNOSIS — S065XAA Traumatic subdural hemorrhage with loss of consciousness status unknown, initial encounter: Secondary | ICD-10-CM | POA: Diagnosis not present

## 2023-08-31 LAB — CBC WITH DIFFERENTIAL/PLATELET
Abs Immature Granulocytes: 0.02 10*3/uL (ref 0.00–0.07)
Basophils Absolute: 0 10*3/uL (ref 0.0–0.1)
Basophils Relative: 0 %
Eosinophils Absolute: 0.1 10*3/uL (ref 0.0–0.5)
Eosinophils Relative: 2 %
HCT: 27.2 % — ABNORMAL LOW (ref 39.0–52.0)
Hemoglobin: 8.7 g/dL — ABNORMAL LOW (ref 13.0–17.0)
Immature Granulocytes: 0 %
Lymphocytes Relative: 19 %
Lymphs Abs: 1 10*3/uL (ref 0.7–4.0)
MCH: 30 pg (ref 26.0–34.0)
MCHC: 32 g/dL (ref 30.0–36.0)
MCV: 93.8 fL (ref 80.0–100.0)
Monocytes Absolute: 0.4 10*3/uL (ref 0.1–1.0)
Monocytes Relative: 7 %
Neutro Abs: 3.9 10*3/uL (ref 1.7–7.7)
Neutrophils Relative %: 72 %
Platelets: 299 10*3/uL (ref 150–400)
RBC: 2.9 MIL/uL — ABNORMAL LOW (ref 4.22–5.81)
RDW: 17.9 % — ABNORMAL HIGH (ref 11.5–15.5)
WBC: 5.4 10*3/uL (ref 4.0–10.5)
nRBC: 0 % (ref 0.0–0.2)

## 2023-08-31 LAB — BASIC METABOLIC PANEL WITH GFR
Anion gap: 7 (ref 5–15)
BUN: 24 mg/dL — ABNORMAL HIGH (ref 8–23)
CO2: 26 mmol/L (ref 22–32)
Calcium: 8.1 mg/dL — ABNORMAL LOW (ref 8.9–10.3)
Chloride: 103 mmol/L (ref 98–111)
Creatinine, Ser: 1.24 mg/dL (ref 0.61–1.24)
GFR, Estimated: >60 mL/min (ref >60)
Glucose, Bld: 237 mg/dL — ABNORMAL HIGH (ref 70–99)
Potassium: 4.3 mmol/L (ref 3.5–5.1)
Sodium: 136 mmol/L (ref 135–145)

## 2023-08-31 LAB — GLUCOSE, CAPILLARY
Glucose-Capillary: 69 mg/dL — ABNORMAL LOW (ref 70–99)
Glucose-Capillary: 92 mg/dL (ref 70–99)
Glucose-Capillary: 112 mg/dL — ABNORMAL HIGH (ref 70–99)
Glucose-Capillary: 217 mg/dL — ABNORMAL HIGH (ref 70–99)

## 2023-08-31 NOTE — Inpatient Diabetes Management (Addendum)
 Inpatient Diabetes Program Recommendations  AACE/ADA: New Consensus Statement on Inpatient Glycemic Control   Target Ranges:  Prepandial:   less than 140 mg/dL      Peak postprandial:   less than 180 mg/dL (1-2 hours)      Critically ill patients:  140 - 180 mg/dL    Latest Reference Range & Units 08/31/23 05:20 08/31/23 05:52 08/31/23 07:22  Glucose-Capillary 70 - 99 mg/dL 69 (L) 92 914 (H)    Latest Reference Range & Units 08/30/23 07:49 08/30/23 11:47 08/30/23 16:13 08/30/23 16:14 08/30/23 16:57 08/30/23 20:16  Glucose-Capillary 70 - 99 mg/dL 782 (H) 956 (H) 48 (L) 43 (LL) 92 243 (H)   Review of Glycemic Control  Diabetes history: DM2 Outpatient Diabetes medications: Tresiba  5 units daily, Novolog  0-9 units TID Current orders for Inpatient glycemic control: Semglee  5 units Q24H, Novolog  0-15 units TID with meals, Novolog  0-5 units QHS  Inpatient Diabetes Program Recommendations:    Insulin : Please consider decreasing Semglee  to 4 units daily and Novolog  correction to 0-9 units TID with meals.  Thanks, Beacher Limerick, RN, MSN, CDCES Diabetes Coordinator Inpatient Diabetes Program [phone] (Team Pager from 8am to 5pm)

## 2023-08-31 NOTE — Progress Notes (Signed)
 Physical Therapy Treatment Patient Details Name: Nicholas Weiss MRN: [id number] DOB: 05-03-1950 Today's Date: 08/31/2023   History of Present Illness Pt is a 73 y.o. male with medical history significant of IDDM, diabetic neuropathy, orthostatic hypotension, HTN, chronic HFpEF, mild HOCM, PAF on Eliquis , multiple myeloma on chronic chemotherapy, seizure on Keppra , presented with fall at home.  MD assessment includes: syncope and fall, traumatic SDH, acute on chronic anemia, and orthostatic hypotension.    PT Comments  Patient standing in bathroom on arrival to the room with bed alarm going off. He reaches out for furniture for support and is mildly unsteady without use of rolling walker. Patient reports no dizziness with mobility without abdominal binder in place. Recommend to continue PT to maximize independence and facilitate return to prior level of function.    If plan is discharge home, recommend the following: A little help with walking and/or transfers;A little help with bathing/dressing/bathroom;Assistance with cooking/housework;Assist for transportation;Help with stairs or ramp for entrance   Can travel by private vehicle        Equipment Recommendations  None recommended by PT    Recommendations for Other Services       Precautions / Restrictions Precautions Precautions: Fall Recall of Precautions/Restrictions: Impaired Precaution/Restrictions Comments: Hx of multiple falls, orthostatic hypotension. TED hose and abdominal binder Restrictions Weight Bearing Restrictions Per Provider Order: No     Mobility  Bed Mobility Overal bed mobility: Modified Independent                  Transfers Overall transfer level: Modified independent                      Ambulation/Gait Ambulation/Gait assistance: Contact guard assist, Min assist Gait Distance (Feet): 20 Feet Assistive device: None Gait Pattern/deviations: Step-through pattern, Staggering right Gait  velocity: decreased     General Gait Details: on arrival to room, patient was in the bathroom by himself with bed alarm going off. patient reports no dizziness but abdominal binder was not in place and TED hose were on but not pulled up. patient tends to hold on to the furniture for support with education on importance to use rolling walker for safety. he declined walking further at this time   Comptroller Bed    Modified Rankin (Stroke Patients Only)       Balance Overall balance assessment: Needs assistance Sitting-balance support: Feet supported Sitting balance-Leahy Scale: Good     Standing balance support: During functional activity Standing balance-Leahy Scale: Poor Standing balance comment: poor without support                            Communication Communication Communication: No apparent difficulties  Cognition Arousal: Alert Behavior During Therapy: WFL for tasks assessed/performed   PT - Cognitive impairments: History of cognitive impairments                       PT - Cognition Comments: decreased safety awareness. safety cues and education provided for fall prevention and safety with mobility Following commands: Impaired      Cueing Cueing Techniques: Verbal cues, Tactile cues  Exercises      General Comments        Pertinent Vitals/Pain Pain Assessment Pain Assessment: No/denies pain    Home Living  Prior Function            PT Goals (current goals can now be found in the care plan section) Acute Rehab PT Goals Patient Stated Goal: to go home. PT Goal Formulation: With patient Time For Goal Achievement: 09/12/23 Potential to Achieve Goals: Fair Progress towards PT goals: Progressing toward goals    Frequency    Min 2X/week      PT Plan      Co-evaluation              AM-PAC PT "6 Clicks" Mobility   Outcome Measure   Help needed turning from your back to your side while in a flat bed without using bedrails?: None Help needed moving from lying on your back to sitting on the side of a flat bed without using bedrails?: None Help needed moving to and from a bed to a chair (including a wheelchair)?: A Little Help needed standing up from a chair using your arms (e.g., wheelchair or bedside chair)?: A Little Help needed to walk in hospital room?: A Little Help needed climbing 3-5 steps with a railing? : A Little 6 Click Score: 20    End of Session Equipment Utilized During Treatment: Gait belt Activity Tolerance: Patient tolerated treatment well Patient left: in bed;with call bell/phone within reach;with bed alarm set Nurse Communication: Mobility status PT Visit Diagnosis: Unsteadiness on feet (R26.81);Repeated falls (R29.6);Muscle weakness (generalized) (M62.81);Difficulty in walking, not elsewhere classified (R26.2)     Time: 1191-4782 PT Time Calculation (min) (ACUTE ONLY): 9 min  Charges:    $Therapeutic Activity: 8-22 mins PT General Charges $$ ACUTE PT VISIT: 1 Visit                     Ozie Bo, PT, MPT   Erlene Hawks 08/31/2023, 12:13 PM

## 2023-08-31 NOTE — Plan of Care (Signed)
 Tamea Faith, LPN

## 2023-08-31 NOTE — Discharge Summary (Signed)
 Physician Discharge Summary  Nicholas Weiss ZOX:[id number] DOB: [birth year] DOA: 08/27/2023  PCP: Monique Ano, MD  Admit date: 08/27/2023 Discharge date: 08/31/2023  Admitted From: Home Disposition:  Home with home health  Recommendations for Outpatient Follow-up:  Follow up with PCP in 1-2 weeks Follow up outpatient neurosurgery 2 weeks  Home Health: Yes PT OT aide social work Equipment/Devices: None  Discharge Condition: Stable CODE STATUS: Full Diet recommendation: Carb modified  Brief/Interim Summary:  72 y.o. male with medical history significant of IDDM, diabetic neuropathy, orthostatic hypotension, HTN, chronic HFpEF, mild HOCM, PAF on Eliquis , multiple myeloma on chronic chemotherapy, seizure on Keppra , presented with fall at home.   Patient does not remember what has happened, or history given by phone interviewing with significant other as well as patient's son, both live with patient.  Family reported that patient has been complaining about lightheadedness despite taking daily fludrocortisone  and as needed midodrine .  Significant other reported that patient has had frequent episode of hypotension since last week and had to take midodrine .  Yesterday morning SBP 70s and patient took 1 midodrine .  At night around 1 AM, patient woke up to go to bathroom and fell.  Family member heard a loud thump and went to see the patient and saw him on the floor cold clammy and a few twitching like movement but no overall shakiness.  The episode lasted about 4-5 minutes and patient recovered consciousness himself and was confused for short period time.  There was no tongue biting or loss control of urine or bowel movements.    Discharge Diagnoses:  Principal Problem:   Subdural hematoma (HCC) Active Problems:   Subdural bleeding (HCC)   SDH (subdural hematoma) (HCC)  Syncope and fall Traumatic SDH - Likely secondary to orthostatic hypotension - Patient already on maximal therapy  for orthostatic hypotension, including medications of fludrocortisone  as well as a as needed midodrine .  Appears that the patient also has a baseline hypotension and midodrine  has been only as needed.  I discussed with family regarding the safety of as needed midodrine  versus scheduled midodrine .  And patient has been already on compression stocking as well.  Prognosis is poor given the episode of frequent symptoms of orthostatic hypotension and frequent falls.  Family made aware.  Also discussed with family regarding the safety benefit and risk of anticoagulation in patient's case, all parties agreed with discontinue anticoagulation. -Repeat head CT showed stabilized SDH picture with mild midline shift.  No neurological deficit, neurosurgery not recommending any intervention.  Will see in clinic 2 weeks Plan: Abdominal binder and TED hose in place.  Orthostasis symptoms are waxing and waning.  Improved at time of discharge.  Discharge home with home health services.  Resume home midodrine  and Florinef .  Hold Eliquis  for 2 weeks.  Follow-up outpatient neurosurgery 2 weeks for repeat imaging     Discharge Instructions  Discharge Instructions     Diet - low sodium heart healthy   Complete by: As directed    Increase activity slowly   Complete by: As directed    No wound care   Complete by: As directed       Allergies as of 08/31/2023       Reactions   Penicillins Other (See Comments)   Childhood allergy -tolerated amoxil  03/2022 Unknown reaction Has patient had a PCN reaction causing immediate rash, facial/tongue/throat swelling, SOB or lightheadedness with hypotension: No Has patient had a PCN reaction causing severe rash involving mucus membranes or skin necrosis:  No Has patient had a PCN reaction that required hospitalization No Has patient had a PCN reaction occurring within the last 10 years: No If all of the above answers are "NO", then may proceed with Cephalosporin use.         Medication List     PAUSE taking these medications    apixaban  5 MG Tabs tablet Wait to take this until: September 09, 2023 Commonly known as: ELIQUIS  Take 1 tablet (5 mg total) by mouth 2 (two) times daily. Start Eliquis  after 2 weeks which will be 03/02/2023       STOP taking these medications    traZODone  50 MG tablet Commonly known as: DESYREL        TAKE these medications    acetaminophen  500 MG tablet Commonly known as: TYLENOL  Take 1,000 mg by mouth daily as needed for mild pain or moderate pain.   acyclovir  400 MG tablet Commonly known as: ZOVIRAX  Take 1 tablet (400 mg total) by mouth 2 (two) times daily.   ascorbic acid  500 MG tablet Commonly known as: VITAMIN C  Take 1 tablet (500 mg total) by mouth daily.   atorvastatin  80 MG tablet Commonly known as: LIPITOR  Take 1 tablet (80 mg total) by mouth at bedtime. Hold while taking Paxlovid    bisacodyl  5 MG EC tablet Commonly known as: bisacodyl  Take 1 tablet (5 mg total) by mouth at bedtime. Skip the dose if no constipation   Dexcom G7 Sensor Misc 1 each by Does not apply route 4 (four) times daily -  before meals and at bedtime.   fludrocortisone  0.1 MG tablet Commonly known as: FLORINEF  Take 1 tablet (0.1 mg total) by mouth daily. Skip the dose if SBP >130   gabapentin  300 MG capsule Commonly known as: NEURONTIN  Take 300 mg by mouth 2 (two) times daily.   Insulin  Aspart FlexPen 100 UNIT/ML Commonly known as: NOVOLOG  Sliding Scale: Glucose 70 - 120: 0 units Glucose 121 - 150: 1 unit Glucose 151 - 200: 2 units Glucose 201 - 250: 3 units Glucose 251 - 300: 5 units Glucose 301 - 350: 7 units Glucose 351 - 400: 9 units Glucose > 400: call your docotr   iron  polysaccharides 150 MG capsule Commonly known as: NIFEREX Take 1 capsule (150 mg total) by mouth daily.   levETIRAcetam  500 MG tablet Commonly known as: KEPPRA  Take 1 tablet by mouth twice daily   midodrine  2.5 MG tablet Commonly known as:  PROAMATINE  Take 2.5 mg by mouth 3 (three) times daily.   multivitamin with minerals Tabs tablet Take 1 tablet by mouth daily.   ondansetron  8 MG tablet Commonly known as: ZOFRAN  Take 8 mg by mouth daily as needed for nausea or vomiting.   pantoprazole  40 MG tablet Commonly known as: PROTONIX  Take 40 mg by mouth daily.   polyethylene glycol 17 g packet Commonly known as: MiraLax  Take 17 g by mouth 2 (two) times daily. Skip the dose if no constipation   pomalidomide  2 MG capsule Commonly known as: POMALYST  Take 1 capsule (2 mg total) by mouth daily. Take for 21 days, the hold for 7 days. Repeat every 28 days.   Tresiba  FlexTouch 100 UNIT/ML FlexTouch Pen Generic drug: insulin  degludec Inject 5 Units into the skin daily. Home med.        Follow-up Information     Monique Ano, MD Follow up.   Specialty: Family Medicine Why: hospital follow up Contact information: [address] [facility]  [address] [phone]         Jodeen Munch, MD. Schedule an appointment as soon as possible for a visit in 2 week(s).   Specialty: Neurosurgery Contact information: [address] [place] [address] [phone]                Allergies  Allergen Reactions   Penicillins Other (See Comments)    Childhood allergy -tolerated amoxil  03/2022 Unknown reaction Has patient had a PCN reaction causing immediate rash, facial/tongue/throat swelling, SOB or lightheadedness with hypotension: No Has patient had a PCN reaction causing severe rash involving mucus membranes or skin necrosis: No Has patient had a PCN reaction that required hospitalization No Has patient had a PCN reaction occurring within the last 10 years: No If all of the above answers are "NO", then may proceed with Cephalosporin use.     Consultations: Neurosurgery   Procedures/Studies: DG Abd 1 View Result Date: 08/30/2023 CLINICAL DATA:   Constipation EXAM: ABDOMEN - 1 VIEW COMPARISON:  None Available. FINDINGS: Normal colonic stool burden. Nonobstructive bowel gas pattern. Visualized osseous structures are within normal limits. IMPRESSION: Negative.  Normal colonic stool burden. Electronically Signed   By: Zadie Herter M.D.   On: 08/30/2023 21:06   EEG adult Result Date: 08/27/2023 Arleene Lack, MD     08/27/2023  4:32 PM Patient Name: DANTONIO JUSTEN MRN: [id number] Epilepsy Attending: Arleene Lack Referring Physician/Provider: [place], MD Date: 08/27/2023 Duration: 31.23 mins Patient history: 73yo M with h/o seizures came with syncope. EEG to evaluate for seizure Level of alertness: Awake, asleep AEDs during EEG study: LEV, GBP Technical aspects: This EEG study was done with scalp electrodes positioned according to the 10-20 International system of electrode placement. Electrical activity was reviewed with band pass filter of 1-70Hz , sensitivity of 7 uV/mm, display speed of 17mm/sec with a 60Hz  notched filter applied as appropriate. EEG data were recorded continuously and digitally stored.  Video monitoring was available and reviewed as appropriate. Description: The posterior dominant rhythm consists of 7.5 Hz activity of moderate voltage (25-35 uV) seen predominantly in posterior head regions, symmetric and reactive to eye opening and eye closing. Sleep was characterized by sleep spindles (12 to 14 Hz), maximal frontocentral region. EEG showed continuous generalized 5 to 7 Hz theta slowing admixed with intermittent generalized 2-3 Hz delta slowing, at times with triphasic morphology. Hyperventilation and photic stimulation were not performed.    ABNORMALITY - Continuous slow, generalized  IMPRESSION: This study is suggestive of moderate diffuse encephalopathy. No seizures or epileptiform discharges were seen throughout the recording.  Priyanka Suzanne Erps    CT HEAD WO CONTRAST ( ) Result Date: 08/27/2023 CLINICAL DATA:   73 year old male status post fall with hypodense left subdural hematoma. EXAM: CT HEAD WITHOUT CONTRAST TECHNIQUE: Contiguous axial images were obtained from the base of the skull through the vertex without intravenous contrast. RADIATION DOSE REDUCTION: This exam was performed according to the departmental dose-optimization program which includes automated exposure control, adjustment of the mA and/or kV according to patient size and/or use of iterative reconstruction technique. COMPARISON:  Head CT 0015 hours today. FINDINGS: Brain: Redemonstrated left side subdural hematoma, hyperdense blood products noted along the left frontal operculum level on coronal image 34. Left SDH thickness is 4-5 mm and not significantly changed. Smaller contralateral and mostly superior convexity low-density right side subdural hematoma also noted, increased in conspicuity but small measuring 2-3 mm (coronal image 45). Also there is hyperdense para falcine  subdural blood, small volume (coronal image 34) up to 3 mm and stable superimposed on dural calcification. Trace rightward midline shift (series 6, image 15). Ventricle size and configuration remains normal. No IVH. No subarachnoid or intra-axial blood. Stable gray-white matter differentiation throughout the brain. No cortically based acute infarct identified. Vascular: Calcified atherosclerosis at the skull base. No suspicious intracranial vascular hyperdensity. Skull: Stable.  No skull fracture identified. Sinuses/Orbits: Visualized paranasal sinuses and mastoids are stable and well aerated. Other: Leftward gaze now. No acute orbit or scalp soft tissue injury is identified. IMPRESSION: 1. Positive for small Bilateral Subdural Hematomas: - right side mixed density SDH is 4-5 mm thickness and not significantly changed from 0015 hours. - left SDH is low-density and smaller, 2-3 mm, more conspicuous since the prior exam. - small hyperdense right parafalcine SDH is stable, 3 mm. 2. No  skull fracture identified. No significant intracranial mass effect at this time, trace rightward midline shift. Electronically Signed   By: Marlise Simpers M.D.   On: 08/27/2023 06:19   CT Cervical Spine Wo Contrast Result Date: 08/27/2023 CLINICAL DATA:  Fall.  Neck trauma (Age >= 65y) EXAM: CT CERVICAL SPINE WITHOUT CONTRAST TECHNIQUE: Multidetector CT imaging of the cervical spine was performed without intravenous contrast. Multiplanar CT image reconstructions were also generated. RADIATION DOSE REDUCTION: This exam was performed according to the departmental dose-optimization program which includes automated exposure control, adjustment of the mA and/or kV according to patient size and/or use of iterative reconstruction technique. COMPARISON:  07/21/2023 FINDINGS: Alignment: Normal Skull base and vertebrae: No acute fracture. No primary bone lesion or focal pathologic process. Soft tissues and spinal canal: No prevertebral fluid or swelling. No visible canal hematoma. Disc levels: Degenerative disc disease changes at C5-6 and C6-7 with disc space narrowing and spurring. Moderate degenerative facet disease, left greater than right. Upper chest: No acute findings Other: None IMPRESSION: Degenerative changes.  No acute bony abnormality. Electronically Signed   By: Janeece Mechanic M.D.   On: 08/27/2023 00:29   CT HEAD WO CONTRAST ( ) Result Date: 08/27/2023 CLINICAL DATA:  Head trauma, minor (Age >= 65y).  Fall. EXAM: CT HEAD WITHOUT CONTRAST TECHNIQUE: Contiguous axial images were obtained from the base of the skull through the vertex without intravenous contrast. RADIATION DOSE REDUCTION: This exam was performed according to the departmental dose-optimization program which includes automated exposure control, adjustment of the mA and/or kV according to patient size and/or use of iterative reconstruction technique. COMPARISON:  07/21/2023 FINDINGS: Brain: There is a low-density to isodense subdural fluid collection  noted over the left cerebral convexity, new since prior study concerning for isodense subdural hematoma. This measures approximately 5 mm in thickness on coronal image 48, series 4. No mass effect or midline shift. No hydrocephalus. No intracerebral hemorrhage or acute infarction. Again noted are extensive calcifications along the falx and tentorium. Vascular: No hyperdense vessel or unexpected calcification. Skull: No acute calvarial abnormality. Sinuses/Orbits: No acute findings Other: None IMPRESSION: Low-density to isodense subdural fluid collection measuring 5 mm in thickness over the left cerebral convexity concerning for small subdural hematoma. This is age indeterminate but new since prior study suggesting acute or subacute. No mass effect or midline shift. Electronically Signed   By: Janeece Mechanic M.D.   On: 08/27/2023 00:27   Portable chest x-ray (1 view) Result Date: 08/15/2023 CLINICAL DATA:  [id number] DKA (diabetic ketoacidosis) (HCC) [id number] EXAM: PORTABLE CHEST 1 VIEW COMPARISON:  April 05, 2023 FINDINGS: The cardiomediastinal silhouette is unchanged in contour.Atherosclerotic  calcifications. Apices are obscured by overlapping chin. No pleural effusion. No pneumothorax. No acute pleuroparenchymal abnormality. Remote RIGHT-sided rib fractures. IMPRESSION: No acute cardiopulmonary abnormality. Electronically Signed   By: Clancy Crimes M.D.   On: 08/15/2023 17:49      Subjective: Seen and examined on the day of discharge.  Stable no distress.  Appropriate for discharge home.  Discharge Exam: Vitals:   08/31/23 0757 08/31/23 1158  BP: (!) 143/90 (!) 150/96  Pulse: 71 74  Resp: 18 16  Temp: 97.8 F (36.6 C) 97.8 F (36.6 C)  SpO2: 100% 100%   Vitals:   08/31/23 0455 08/31/23 0500 08/31/23 0757 08/31/23 1158  BP: (!) 152/97  (!) 143/90 (!) 150/96  Pulse: 77  71 74  Resp: 18  18 16   Temp: 98.2 F (36.8 C)  97.8 F (36.6 C) 97.8 F (36.6 C)  TempSrc: Oral  Oral Oral  SpO2:  100%  100% 100%  Weight:  66.2 kg    Height:        General: Pt is alert, awake, not in acute distress Cardiovascular: RRR, S1/S2 +, no rubs, no gallops Respiratory: CTA bilaterally, no wheezing, no rhonchi Abdominal: Soft, NT, ND, bowel sounds + Extremities: no edema, no cyanosis    The results of significant diagnostics from this hospitalization (including imaging, microbiology, ancillary and laboratory) are listed below for reference.     Microbiology: Recent Results (from the past 240 hours)  MRSA Next Gen by PCR, Nasal     Status: None   Collection Time: 08/27/23  3:43 PM   Specimen: Nasal Mucosa; Nasal Swab  Result Value Ref Range Status   MRSA by PCR Next Gen NOT DETECTED NOT DETECTED Final    Comment: (NOTE) The GeneXpert MRSA Assay (FDA approved for NASAL specimens only), is one component of a comprehensive MRSA colonization surveillance program. It is not intended to diagnose MRSA infection nor to guide or monitor treatment for MRSA infections. Test performance is not FDA approved in patients less than 57 years old. Performed at [facility], [address] [place], [place] [address]      Labs: BNP (last 3 results) No results for input(s): "BNP" in the last 8760 hours. Basic Metabolic Panel: Recent Labs  Lab 08/26/23 2357 08/27/23 2105 08/29/23 0831 08/30/23 1707 08/31/23 1217  NA 138  --  135 139 136  K 3.8  --  4.1 4.2 4.3  CL 107  --  103 105 103  CO2 24  --  29 23 26   GLUCOSE 119* 383* 243* 109* 237*  BUN 24*  --  24* 25* 24*  CREATININE 1.38*  --  1.10 1.55* 1.24  CALCIUM  7.9*  --  8.0* 8.1* 8.1*  MG  --  2.1  --   --   --    Liver Function Tests: No results for input(s): "AST", "ALT", "ALKPHOS", "BILITOT", "PROT", "ALBUMIN" in the last 168 hours. No results for input(s): "LIPASE", "AMYLASE" in the last 168 hours. No results for input(s): "AMMONIA " in the last 168 hours. CBC: Recent Labs  Lab 08/26/23 2357 08/28/23 0356  08/28/23 0929 08/29/23 0831 08/30/23 1707 08/31/23 1217  WBC 3.2* 2.9*  --  3.6* 6.7 5.4  NEUTROABS  --   --   --  2.3 4.7 3.9  HGB 7.8* 6.4* 7.2* 8.8* 9.1* 8.7*  HCT 25.6* 20.0* 23.5* 26.2* 28.7* 27.2*  MCV 102.0* 96.2  --  91.6 97.0 93.8  PLT 294 247  --  271  297 299   Cardiac Enzymes: No results for input(s): "CKTOTAL", "CKMB", "CKMBINDEX", "TROPONINI" in the last 168 hours. BNP: Invalid input(s): "POCBNP" CBG: Recent Labs  Lab 08/30/23 2016 08/31/23 0520 08/31/23 0552 08/31/23 0722 08/31/23 1134  GLUCAP 243* 69* 92 112* 217*   D-Dimer No results for input(s): "DDIMER" in the last 72 hours. Hgb A1c No results for input(s): "HGBA1C" in the last 72 hours. Lipid Profile No results for input(s): "CHOL", "HDL", "LDLCALC", "TRIG", "CHOLHDL", "LDLDIRECT" in the last 72 hours. Thyroid  function studies No results for input(s): "TSH", "T4TOTAL", "T3FREE", "THYROIDAB" in the last 72 hours.  Invalid input(s): "FREET3" Anemia work up No results for input(s): "VITAMINB12", "FOLATE", "FERRITIN", "TIBC", "IRON ", "RETICCTPCT" in the last 72 hours. Urinalysis    Component Value Date/Time   COLORURINE YELLOW (A) 08/27/2023 0622   APPEARANCEUR CLEAR (A) 08/27/2023 0622   LABSPEC 1.020 08/27/2023 0622   PHURINE 6.0 08/27/2023 0622   GLUCOSEU 50 (A) 08/27/2023 0622   HGBUR NEGATIVE 08/27/2023 0622   BILIRUBINUR NEGATIVE 08/27/2023 0622   KETONESUR NEGATIVE 08/27/2023 0622   PROTEINUR 30 (A) 08/27/2023 0622   NITRITE NEGATIVE 08/27/2023 0622   LEUKOCYTESUR NEGATIVE 08/27/2023 0622   Sepsis Labs Recent Labs  Lab 08/28/23 0356 08/29/23 0831 08/30/23 1707 08/31/23 1217  WBC 2.9* 3.6* 6.7 5.4   Microbiology Recent Results (from the past 240 hours)  MRSA Next Gen by PCR, Nasal     Status: None   Collection Time: 08/27/23  3:43 PM   Specimen: Nasal Mucosa; Nasal Swab  Result Value Ref Range Status   MRSA by PCR Next Gen NOT DETECTED NOT DETECTED Final    Comment:  (NOTE) The GeneXpert MRSA Assay (FDA approved for NASAL specimens only), is one component of a comprehensive MRSA colonization surveillance program. It is not intended to diagnose MRSA infection nor to guide or monitor treatment for MRSA infections. Test performance is not FDA approved in patients less than 22 years old. Performed at [facility], [address], [place], [place] [address]      Time coordinating discharge: Over 30 minutes  SIGNED:   Tiajuana Fluke, MD  [facility] 08/31/2023, 1:44 PM Pager   If 7PM-7AM, please contact night-coverage

## 2023-08-31 NOTE — TOC Progression Note (Signed)
 Transition of Care [facility]) - Progression Note    Patient Details  Name: Nicholas Weiss MRN: [id number] Date of Birth: 21-Jul-1950  [facility] CM/SW Contact  Elsie Halo, RN Phone Number: 08/31/2023, 1:27 PM  Clinical Narrative:      TOC received message from Brand [phone] with [facility]. The patient is active with HH PT/OT, Aide, and Social Work,  [facility] will continue to follow   Barriers to Discharge: Continued Medical Work up  Expected Discharge Plan and Services       Living arrangements for the past 2 months: Single Family Home                                       Social Determinants of Health (SDOH) Interventions SDOH Screenings   Food Insecurity: No Food Insecurity (08/28/2023)  Housing: Low Risk  (08/28/2023)  Transportation Needs: No Transportation Needs (08/28/2023)  Utilities: Not At Risk (08/28/2023)  Alcohol Screen: Low Risk  (10/02/2020)  Depression (PHQ2-9): Medium Risk (10/02/2020)  Financial Resource Strain: Low Risk  (05/19/2023)   Received from [facility]  Physical Activity: Insufficiently Active (01/01/2020)  Social Connections: Patient Declined (08/28/2023)  Stress: No Stress Concern Present (01/01/2020)  Tobacco Use: Low Risk  (08/28/2023)  Recent Concern: Tobacco Use - Medium Risk (08/13/2023)   Received from [facility]    Readmission Risk Interventions    02/16/2023    3:56 PM 08/24/2022   10:57 AM 05/20/2022   11:41 AM  Readmission Risk Prevention Plan  Transportation Screening Complete Complete Complete  Medication Review Oceanographer) Complete Complete Complete  PCP or Specialist appointment within 3-5 days of discharge Complete Complete Complete  HRI or Home Care Consult Complete Complete Complete  SW Recovery Care/Counseling Consult Not Complete    Palliative Care Screening Not Applicable  Complete  Skilled Nursing Facility Complete Complete Complete

## 2023-09-01 LAB — TYPE AND SCREEN
ABO/RH(D): A POS
Antibody Screen: POSITIVE
DAT, IgG: NEGATIVE
DAT, complement: NEGATIVE
Unit division: 0
Unit division: 0
Unit division: 0

## 2023-09-01 LAB — BPAM RBC
Blood Product Expiration Date: 202506272359
Blood Product Expiration Date: 202506272359
Blood Product Expiration Date: 202506272359
ISSUE DATE / TIME: 202505311244
Unit Type and Rh: 5100
Unit Type and Rh: 6200
Unit Type and Rh: 6200

## 2023-09-01 NOTE — Telephone Encounter (Signed)
 I spoke to [place] and she will call the office back once the CT has been scheduled.

## 2023-09-02 ENCOUNTER — Other Ambulatory Visit: Payer: Self-pay

## 2023-09-02 NOTE — Telephone Encounter (Signed)
 09/13/23 CT 09/21/23 office visit

## 2023-09-03 ENCOUNTER — Other Ambulatory Visit

## 2023-09-03 ENCOUNTER — Ambulatory Visit

## 2023-09-03 ENCOUNTER — Ambulatory Visit: Admitting: Oncology

## 2023-09-08 ENCOUNTER — Other Ambulatory Visit: Payer: Self-pay | Admitting: Oncology

## 2023-09-08 DIAGNOSIS — C9000 Multiple myeloma not having achieved remission: Secondary | ICD-10-CM

## 2023-09-09 ENCOUNTER — Emergency Department
Admission: EM | Admit: 2023-09-09 | Discharge: 2023-09-09 | Disposition: A | Discharge status: Home / Self Care | Source: Home / Self Care | Attending: Emergency Medicine | Admitting: Emergency Medicine

## 2023-09-09 ENCOUNTER — Other Ambulatory Visit: Payer: Self-pay | Admitting: Internal Medicine

## 2023-09-09 ENCOUNTER — Encounter: Payer: Self-pay | Admitting: Oncology

## 2023-09-09 ENCOUNTER — Other Ambulatory Visit: Payer: Self-pay

## 2023-09-09 ENCOUNTER — Emergency Department

## 2023-09-09 ENCOUNTER — Encounter: Payer: Self-pay | Admitting: Emergency Medicine

## 2023-09-09 ENCOUNTER — Emergency Department
Admission: EM | Admit: 2023-09-09 | Discharge: 2023-09-09 | Disposition: A | Discharge status: Home / Self Care | Attending: Emergency Medicine | Admitting: Emergency Medicine

## 2023-09-09 DIAGNOSIS — E1022 Type 1 diabetes mellitus with diabetic chronic kidney disease: Secondary | ICD-10-CM | POA: Insufficient documentation

## 2023-09-09 DIAGNOSIS — W01198A Fall on same level from slipping, tripping and stumbling with subsequent striking against other object, initial encounter: Secondary | ICD-10-CM | POA: Diagnosis not present

## 2023-09-09 DIAGNOSIS — R42 Dizziness and giddiness: Secondary | ICD-10-CM | POA: Insufficient documentation

## 2023-09-09 DIAGNOSIS — N189 Chronic kidney disease, unspecified: Secondary | ICD-10-CM | POA: Diagnosis not present

## 2023-09-09 DIAGNOSIS — S4992XA Unspecified injury of left shoulder and upper arm, initial encounter: Secondary | ICD-10-CM | POA: Diagnosis present

## 2023-09-09 DIAGNOSIS — D72819 Decreased white blood cell count, unspecified: Secondary | ICD-10-CM | POA: Insufficient documentation

## 2023-09-09 DIAGNOSIS — S42202A Unspecified fracture of upper end of left humerus, initial encounter for closed fracture: Secondary | ICD-10-CM | POA: Insufficient documentation

## 2023-09-09 DIAGNOSIS — W19XXXA Unspecified fall, initial encounter: Secondary | ICD-10-CM

## 2023-09-09 DIAGNOSIS — E1065 Type 1 diabetes mellitus with hyperglycemia: Secondary | ICD-10-CM | POA: Insufficient documentation

## 2023-09-09 DIAGNOSIS — D649 Anemia, unspecified: Secondary | ICD-10-CM | POA: Diagnosis not present

## 2023-09-09 LAB — COMPREHENSIVE METABOLIC PANEL WITH GFR
ALT: 39 U/L (ref 0–44)
AST: 32 U/L (ref 15–41)
Albumin: 3.1 g/dL — ABNORMAL LOW (ref 3.5–5.0)
Alkaline Phosphatase: 72 U/L (ref 38–126)
Anion gap: 10 (ref 5–15)
BUN: 28 mg/dL — ABNORMAL HIGH (ref 8–23)
CO2: 22 mmol/L (ref 22–32)
Calcium: 8.3 mg/dL — ABNORMAL LOW (ref 8.9–10.3)
Chloride: 106 mmol/L (ref 98–111)
Creatinine, Ser: 1.43 mg/dL — ABNORMAL HIGH (ref 0.61–1.24)
GFR, Estimated: 53 mL/min — ABNORMAL LOW (ref >60)
Glucose, Bld: 340 mg/dL — ABNORMAL HIGH (ref 70–99)
Potassium: 4.5 mmol/L (ref 3.5–5.1)
Sodium: 138 mmol/L (ref 135–145)
Total Bilirubin: 0.8 mg/dL (ref 0.0–1.2)
Total Protein: 7.7 g/dL (ref 6.5–8.1)

## 2023-09-09 LAB — CBC
HCT: 28.7 % — ABNORMAL LOW (ref 39.0–52.0)
Hemoglobin: 9 g/dL — ABNORMAL LOW (ref 13.0–17.0)
MCH: 30.5 pg (ref 26.0–34.0)
MCHC: 31.4 g/dL (ref 30.0–36.0)
MCV: 97.3 fL (ref 80.0–100.0)
Platelets: 216 10*3/uL (ref 150–400)
RBC: 2.95 MIL/uL — ABNORMAL LOW (ref 4.22–5.81)
RDW: 17.3 % — ABNORMAL HIGH (ref 11.5–15.5)
WBC: 3.5 10*3/uL — ABNORMAL LOW (ref 4.0–10.5)
nRBC: 0 % (ref 0.0–0.2)

## 2023-09-09 LAB — CBG MONITORING, ED: Glucose-Capillary: 305 mg/dL — ABNORMAL HIGH (ref 70–99)

## 2023-09-09 MED ORDER — OXYCODONE-ACETAMINOPHEN 5-325 MG PO TABS: 1.0000 | ORAL_TABLET | ORAL | 0 refills | Status: DC | PRN

## 2023-09-09 MED ORDER — OXYCODONE-ACETAMINOPHEN 5-325 MG PO TABS
1.0000 | ORAL_TABLET | Freq: Once | ORAL | Status: AC
  Administered 2023-09-09: 1 via ORAL
  Filled 2023-09-09: qty 1

## 2023-09-09 NOTE — ED Provider Notes (Signed)
 [facility] Provider Note    Event Date/Time   First MD Initiated Contact with Patient 09/09/23 1622     (approximate)   History   Weakness   HPI  Nicholas Weiss is a 73 year old male with history of type 1 diabetes, multiple myeloma presenting to the emergency department for evaluation of lightheadedness.  Earlier today, patient was in his backyard when he tripped causing him to fall onto his left shoulder.  He adamantly denies hitting his head.  He denies injury to other areas.  He was seen in our ER and had an x-Naevia Unterreiner performed demonstrating a left humeral neck fracture for which patient was placed in a sling.  He went to wait for his ride outside.  Reports that he had worsening pain as he was up and walking and standing in the heat.  Family saw that he was leaning over to the side and looks like he might be about to pass out.  Patient did not have loss of consciousness or a fall.  He was brought back to the ER for further evaluation.  Patient reports he currently feels back at his baseline.  He does note that his insulin  pump was knocked off when he fell earlier today and has not been able to replace it as his supplies are at home.    Physical Exam   Triage Vital Signs: ED Triage Vitals [09/09/23 1458]  Encounter Vitals Group     BP 100/65     Girls Systolic BP Percentile      Girls Diastolic BP Percentile      Boys Systolic BP Percentile      Boys Diastolic BP Percentile      Pulse Rate 80     Resp 19     Temp 98.8 F (37.1 C)     Temp Source Oral     SpO2 98 %     Weight 147 lb 11.3 oz (67 kg)     Height 5' 10 (1.778 m)     Head Circumference      Peak Flow      Pain Score 0     Pain Loc      Pain Education      Exclude from Growth Chart     Most recent vital signs: Vitals:   09/09/23 1458  BP: 100/65  Pulse: 80  Resp: 19  Temp: 98.8 F (37.1 C)  SpO2: 98%     General: Awake, interactive  Head:  Atraumatic CV:  Regular rate,  good peripheral perfusion.  Resp:  Unlabored respirations, lungs clear to auscultation Abd:  Nondistended, soft, nontender Neuro:  Pain over the left proximal shoulder, sling in place, otherwise 5 out of 5 strength of bilateral upper and lower extremities   ED Results / Procedures / Treatments   Labs (all labs ordered are listed, but only abnormal results are displayed) Labs Reviewed  COMPREHENSIVE METABOLIC PANEL WITH GFR - Abnormal; Notable for the following components:      Result Value   Glucose, Bld 340 (*)    BUN 28 (*)    Creatinine, Ser 1.43 (*)    Calcium  8.3 (*)    Albumin 3.1 (*)    GFR, Estimated 53 (*)    All other components within normal limits  CBC - Abnormal; Notable for the following components:   WBC 3.5 (*)    RBC 2.95 (*)    Hemoglobin 9.0 (*)    HCT 28.7 (*)  RDW 17.3 (*)    All other components within normal limits  CBG MONITORING, ED - Abnormal; Notable for the following components:   Glucose-Capillary 305 (*)    All other components within normal limits  URINALYSIS, ROUTINE W REFLEX MICROSCOPIC     EKG EKG independently reviewed and interpreted by myself demonstrates:  EKG demonstrates junctional versus sinus rhythm at a rate of 77, QRS 80, QTc 538, no acute ST changes  RADIOLOGY Imaging independently reviewed and interpreted by myself demonstrates:  Shoulder x-Salim Forero demonstrates minimally displaced fracture of the left humeral neck  Formal Radiology Read:  DG Shoulder Left Result Date: 09/09/2023 CLINICAL DATA:  Status post fall onto the left shoulder EXAM: LEFT SHOULDER - 3 VIEW COMPARISON:  None Available. FINDINGS: Comminuted fracture of the left humeral neck with 1 cortical width medial displacement. No acute dislocation of the glenohumeral joint. Degenerative changes of the shoulder. IMPRESSION: Minimally displaced, comminuted fracture of the left humeral neck. Electronically Signed   By: Limin  Xu M.D.   On: 09/09/2023 13:30     PROCEDURES:  Critical Care performed: No  Procedures   MEDICATIONS ORDERED IN ED: Medications - No data to display   IMPRESSION / MDM / ASSESSMENT AND PLAN / ED COURSE  I reviewed the triage vital signs and the nursing notes.  Differential diagnosis includes, but is not limited to, anemia, lecture light abnormality, hypoglycemia, lightheadedness in the setting of acute pain, heat, no evidence of trauma aside from recently identified humeral fracture  Patient's presentation is most consistent with acute presentation with potential threat to life or bodily function.  73 year old male presenting to the emergency department for evaluation of lightheadedness, resolved at the time of my initial evaluation.  Stable vitals on presentation.  Labs with stable anemia, mild leukopenia.  CMP with creatinine slightly increased from most recent prior at 1.43, but similar to prior values.  Glucose is elevated at 340.  EKG without acute ischemic findings.  Suspect likely multifactorial lightheadedness, likely related to a combination of acute pain, standing in the heat, possible dehydration, hyperglycemia.  I did discuss head imaging given recent fall, but patient is adamant that he did not hit his head.  I also discussed IV fluids and insulin  as he may have a component of dehydration.  Patient reports that he is tolerating fluids, feels back at his baseline and would prefer to be discharged home.  He will replace his insulin  pump when he is at home.  He does demonstrate decision-making capacity.  Strict return precautions were provided.  He was discharged in stable condition.     FINAL CLINICAL IMPRESSION(S) / ED DIAGNOSES   Final diagnoses:  Lightheadedness     Rx / DC Orders   ED Discharge Orders     None        Note:  This document was prepared using Dragon voice recognition software and may include unintentional dictation errors.   Claria Crofts, MD 09/09/23 [phone]

## 2023-09-09 NOTE — ED Triage Notes (Signed)
 Pt here with weakness. Pt was outside after getting discharged from the ED, family saw him leaning to the side asleep about to fall. Pt states he feels ok. Pt not wearing insulin  pump, blood sugar elevated.

## 2023-09-09 NOTE — ED Triage Notes (Signed)
 Presents from home s/p fall  States he was coming back in from outside  And lost his balance   hitting his left shoulder and arm

## 2023-09-09 NOTE — ED Provider Notes (Signed)
 [facility] Provider Note   Event Date/Time   First MD Initiated Contact with Patient 09/09/23 1157     (approximate) History  Fall  HPI DENMAN PICHARDO is a 73 y.o. male with a past medical history of smoldering multiple myeloma, mild cognitive impairment, type 1 diabetes, CKD, recurrent syncopal episodes, and generalized weakness who presents after a fall complaining of left shoulder and arm pain.  Patient states that he lost his balance and fell upward onto a set of stairs landing directly on his left upper arm.  Patient states that he has had pain with movement at this arm since the fall.  Patient denies any head trauma or loss of consciousness. ROS: Patient currently denies any vision changes, tinnitus, difficulty speaking, facial droop, sore throat, chest pain, shortness of breath, abdominal pain, nausea/vomiting/diarrhea, dysuria, or weakness/numbness/paresthesias in any extremity   Physical Exam  Triage Vital Signs: ED Triage Vitals  Encounter Vitals Group     BP 09/09/23 1158 (!) (P) 162/95     Girls Systolic BP Percentile --      Girls Diastolic BP Percentile --      Boys Systolic BP Percentile --      Boys Diastolic BP Percentile --      Pulse Rate 09/09/23 1158 66     Resp 09/09/23 1158 18     Temp 09/09/23 1158 97.9 F (36.6 C)     Temp Source 09/09/23 1158 Oral     SpO2 09/09/23 1158 100 %     Weight 09/09/23 1159 147 lb 11.3 oz (67 kg)     Height 09/09/23 1159 5' 10 (1.778 m)     Head Circumference --      Peak Flow --      Pain Score 09/09/23 1159 8     Pain Loc --      Pain Education --      Exclude from Growth Chart --    Most recent vital signs: Vitals:   09/09/23 1315 09/09/23 1330  BP:  (!) 179/98  Pulse: 69 69  Resp:    Temp:    SpO2: 100% 99%   General: Awake, oriented x4. CV:  Good peripheral perfusion. Resp:  Normal effort. Abd:  No distention. Other:  Elderly well-developed, well-nourished African-American male  resting comfortably in no acute distress.  Tenderness to palpation over the left lateral deltoid region with limited range of motion at the left shoulder secondary to pain ED Results / Procedures / Treatments  Labs (all labs ordered are listed, but only abnormal results are displayed) Labs Reviewed - No data to display RADIOLOGY ED MD interpretation: X-ray of the left shoulder shows minimally displaced comminuted fracture of the left humeral neck - All radiology independently interpreted and agree with radiology assessment Official radiology report(s): DG Shoulder Left Result Date: 09/09/2023 CLINICAL DATA:  Status post fall onto the left shoulder EXAM: LEFT SHOULDER - 3 VIEW COMPARISON:  None Available. FINDINGS: Comminuted fracture of the left humeral neck with 1 cortical width medial displacement. No acute dislocation of the glenohumeral joint. Degenerative changes of the shoulder. IMPRESSION: Minimally displaced, comminuted fracture of the left humeral neck. Electronically Signed   By: Limin  Xu M.D.   On: 09/09/2023 13:30   PROCEDURES: Critical Care performed: No Procedures MEDICATIONS ORDERED IN ED: Medications  oxyCODONE -acetaminophen  (PERCOCET/ROXICET) 5-325 MG per tablet 1 tablet (1 tablet Oral Given 09/09/23 1347)   IMPRESSION / MDM / ASSESSMENT AND PLAN / ED COURSE  I reviewed the triage vital signs and the nursing notes.                             The patient is on the cardiac monitor to evaluate for evidence of arrhythmia and/or significant heart rate changes. Patient's presentation is most consistent with acute presentation with potential threat to life or bodily function. The Pt was found to have a closed left proximal humeral fracture on XR. The Pt is otherwise well appearing, hemodynamically stable, and shows no evidence of neurovascular injury or compartment syndrome.  I have low suspicion for dislocation, significant ligamentous injury, septic arthritis, gout flare, new  autoimmune arthropathy, or gonococcal arthropathy. Patient was placed in left-sided shoulder sling and swath and will follow up with ortho.    Rx: norco I discussed with patient at length the safety of going home with no use of his left arm.  Patient states that he is right-handed as well as has to feel members at home that help him at all times.  Patient feels stable for discharge at this time with appropriate help at home Dispo: Discharge home with orthopedic follow-up   FINAL CLINICAL IMPRESSION(S) / ED DIAGNOSES   Final diagnoses:  Fall, initial encounter  Closed fracture of proximal end of left humerus, unspecified fracture morphology, initial encounter   Rx / DC Orders   ED Discharge Orders          Ordered    oxyCODONE -acetaminophen  (PERCOCET) 5-325 MG tablet  Every 4 hours PRN        09/09/23 1329           Note:  This document was prepared using Dragon voice recognition software and may include unintentional dictation errors.   Makana Feigel K, MD 09/09/23 1800

## 2023-09-09 NOTE — ED Notes (Signed)
 Patient has yellow socks, bed alarm is on, fall bracelet is on.

## 2023-09-09 NOTE — Discharge Instructions (Signed)
 Please ensure you replace your insulin  pump when you get home.  Make sure you are eating and drinking regularly.  Follow with your primary care doctor for further evaluation.  Return to the ER for new or worsening symptoms.

## 2023-09-09 NOTE — ED Notes (Signed)
 Unable to obtain full blue top tube. Will need to be collected if needed.

## 2023-09-09 NOTE — ED Notes (Signed)
 Patient gave this Clinical research associate permission to call his emergency contact [name] for a ride home.

## 2023-09-10 ENCOUNTER — Encounter: Payer: Self-pay | Admitting: Oncology

## 2023-09-10 ENCOUNTER — Inpatient Hospital Stay

## 2023-09-10 ENCOUNTER — Inpatient Hospital Stay: Attending: Oncology | Admitting: Oncology

## 2023-09-10 VITALS — BP 140/79 | HR 65 | Temp 95.5°F | Resp 18

## 2023-09-10 VITALS — BP 114/61 | HR 67 | Temp 96.6°F | Resp 19 | Ht 70.0 in | Wt 145.7 lb

## 2023-09-10 DIAGNOSIS — Z5111 Encounter for antineoplastic chemotherapy: Secondary | ICD-10-CM

## 2023-09-10 DIAGNOSIS — D631 Anemia in chronic kidney disease: Secondary | ICD-10-CM | POA: Diagnosis not present

## 2023-09-10 DIAGNOSIS — C9000 Multiple myeloma not having achieved remission: Secondary | ICD-10-CM | POA: Insufficient documentation

## 2023-09-10 DIAGNOSIS — N183 Chronic kidney disease, stage 3 unspecified: Secondary | ICD-10-CM

## 2023-09-10 DIAGNOSIS — Z79899 Other long term (current) drug therapy: Secondary | ICD-10-CM

## 2023-09-10 DIAGNOSIS — D649 Anemia, unspecified: Secondary | ICD-10-CM

## 2023-09-10 DIAGNOSIS — Z5112 Encounter for antineoplastic immunotherapy: Secondary | ICD-10-CM | POA: Diagnosis present

## 2023-09-10 DIAGNOSIS — N1831 Chronic kidney disease, stage 3a: Secondary | ICD-10-CM | POA: Diagnosis present

## 2023-09-10 LAB — CBC WITH DIFFERENTIAL/PLATELET
Abs Immature Granulocytes: 0 10*3/uL (ref 0.00–0.07)
Basophils Absolute: 0 10*3/uL (ref 0.0–0.1)
Basophils Relative: 1 %
Eosinophils Absolute: 0.1 10*3/uL (ref 0.0–0.5)
Eosinophils Relative: 4 %
HCT: 26.4 % — ABNORMAL LOW (ref 39.0–52.0)
Hemoglobin: 8.3 g/dL — ABNORMAL LOW (ref 13.0–17.0)
Immature Granulocytes: 0 %
Lymphocytes Relative: 29 %
Lymphs Abs: 0.8 10*3/uL (ref 0.7–4.0)
MCH: 30.4 pg (ref 26.0–34.0)
MCHC: 31.4 g/dL (ref 30.0–36.0)
MCV: 96.7 fL (ref 80.0–100.0)
Monocytes Absolute: 0.3 10*3/uL (ref 0.1–1.0)
Monocytes Relative: 10 %
Neutro Abs: 1.5 10*3/uL — ABNORMAL LOW (ref 1.7–7.7)
Neutrophils Relative %: 56 %
Platelets: 241 10*3/uL (ref 150–400)
RBC: 2.73 MIL/uL — ABNORMAL LOW (ref 4.22–5.81)
RDW: 17.2 % — ABNORMAL HIGH (ref 11.5–15.5)
WBC: 2.7 10*3/uL — ABNORMAL LOW (ref 4.0–10.5)
nRBC: 0 % (ref 0.0–0.2)

## 2023-09-10 LAB — VITAMIN B12: Vitamin B-12: 670 pg/mL (ref 180–914)

## 2023-09-10 LAB — COMPREHENSIVE METABOLIC PANEL WITH GFR
ALT: 33 U/L (ref 0–44)
AST: 24 U/L (ref 15–41)
Albumin: 3.4 g/dL — ABNORMAL LOW (ref 3.5–5.0)
Alkaline Phosphatase: 74 U/L (ref 38–126)
Anion gap: 8 (ref 5–15)
BUN: 32 mg/dL — ABNORMAL HIGH (ref 8–23)
CO2: 24 mmol/L (ref 22–32)
Calcium: 8.4 mg/dL — ABNORMAL LOW (ref 8.9–10.3)
Chloride: 106 mmol/L (ref 98–111)
Creatinine, Ser: 1.52 mg/dL — ABNORMAL HIGH (ref 0.61–1.24)
GFR, Estimated: 49 mL/min — ABNORMAL LOW (ref >60)
Glucose, Bld: 120 mg/dL — ABNORMAL HIGH (ref 70–99)
Potassium: 4.3 mmol/L (ref 3.5–5.1)
Sodium: 138 mmol/L (ref 135–145)
Total Bilirubin: 0.5 mg/dL (ref 0.0–1.2)
Total Protein: 7.8 g/dL (ref 6.5–8.1)

## 2023-09-10 LAB — IRON AND TIBC
Iron: 43 ug/dL — ABNORMAL LOW (ref 45–182)
Saturation Ratios: 20 % (ref 17.9–39.5)
TIBC: 218 ug/dL — ABNORMAL LOW (ref 250–450)
UIBC: 175 ug/dL

## 2023-09-10 LAB — FERRITIN: Ferritin: 211 ng/mL (ref 24–336)

## 2023-09-10 MED ORDER — DIPHENHYDRAMINE HCL 25 MG PO CAPS
50.0000 mg | ORAL_CAPSULE | Freq: Once | ORAL | Status: AC
  Administered 2023-09-10: 50 mg via ORAL
  Filled 2023-09-10: qty 2

## 2023-09-10 MED ORDER — EPOETIN ALFA-EPBX 40000 UNIT/ML IJ SOLN
40000.0000 [IU] | INTRAMUSCULAR | Status: DC
  Administered 2023-09-10: 40000 [IU] via SUBCUTANEOUS
  Filled 2023-09-10: qty 1

## 2023-09-10 MED ORDER — DEXAMETHASONE 4 MG PO TABS
10.0000 mg | ORAL_TABLET | Freq: Once | ORAL | Status: AC
  Administered 2023-09-10: 10 mg via ORAL
  Filled 2023-09-10: qty 3

## 2023-09-10 MED ORDER — OXYCODONE HCL 5 MG PO TABS
10.0000 mg | ORAL_TABLET | Freq: Once | ORAL | Status: DC
  Filled 2023-09-10: qty 2

## 2023-09-10 MED ORDER — DARATUMUMAB-HYALURONIDASE-FIHJ 1800-30000 MG-UT/15ML ~~LOC~~ SOLN
1800.0000 mg | Freq: Once | SUBCUTANEOUS | Status: AC
  Administered 2023-09-10: 1800 mg via SUBCUTANEOUS
  Filled 2023-09-10: qty 15

## 2023-09-10 MED ORDER — ACETAMINOPHEN 325 MG PO TABS
650.0000 mg | ORAL_TABLET | Freq: Once | ORAL | Status: AC
  Administered 2023-09-10: 650 mg via ORAL
  Filled 2023-09-10: qty 2

## 2023-09-10 NOTE — Progress Notes (Signed)
 Hematology/Oncology Consult note [facility]  [phone] [phone]  Patient Care Team: Monique Ano, MD as PCP - General (Family Medicine) [place], [facility] (Optometry) Avonne Boettcher, MD as Consulting Physician (Oncology)   Name of the patient: Nicholas Weiss  [id number]  Jul 31, 1950   Date of visit: 09/10/23  Diagnosis-  high risk IgG lambda multiple myeloma R-ISS stage III   Chief complaint/ Reason for visit-on treatment assessment prior to cycle 10 of Darzalex   Heme/Onc history: Patient is a 73 year old African-American male with a past medical history significant for uncontrolled type 1 diabetes, chronic kidney disease stage III chronic normocytic anemia referred for abnormal SPEP. Patient's creatinine has been fluctuating between 1.5-2 but about 5 months ago it went up all the way to 5 and his blood sugars were in the 1000 range. More recently his kidney numbers have been drifting back to normal values. As a part of the work-up he had serum protein electrophoresis done which showed an elevated gammaglobulin fraction with an M spike of 3%. The amount of M spike was not quantified in the specimen. He has therefore been referred to us  for further management. Patient endorses chronic fatigue reports that his appetite and weight have remained stable   Results of blood work from 04/08/2020 were as follows: CBC showed white count of 2.9, H&H of 10.5/31.2 with an MCV of 91 and a platelet count of 191.  CMP showed a mildly elevated creatinine of 1.2 which was better as compared to 5 months ago when it was 3.9.  Total protein was mildly elevated at 9.1 calcium  normal at 8.4 ferritin and iron  studies B12 folate TSH and haptoglobin were normal.  Myeloma panel revealed an elevated IgG level of 06/04/2004 with an M protein of 3.1 g.  Immunofixation showed IgG lambda specificity.  Serum free light chain ratio was elevated at 25 and free light chain  lambda elevated at 370   Further myeloma work-up including a PET CT scan did not reveal any evidence of edematous lesions.  Bone marrow biopsy showed 23% plasma cells by manual count and 30% by CD138 IHC.  Normal cytogenetics.  FISH studies for myeloma showed gain of 1 q.  13 q-. detected.  P53 not detected.   He was treated for a year and has having smoldering multiple myeloma since both CKD and anemia have been stable for 3 to 4 years and could be secondary to uncontrolled diabetes.In June 2023 IgG levels increased to 5463 with an M protein of 4.1 g as compared to 2.6 g in September 2022.  Serum free light chain ratio remains around 25.  Therefore a repeat bone marrow biopsy was done which shows further increase in plasma cells ranging from 30 to 70% and by immunohistochemistry 60 to 70% of the cells were positive for CD138.  Cytogenetics normal.  FISH study showed 4: 14 translocation and gain of 1 q. making this high risk RISS stage III.  Repeat PET scan showed no lytic lesions   Patient is not a transplant candidate and was started on Velcade  Revlimid  dexamethasone  regimen in July 2023.  He was also referred to [facility] for second opinion but did not go.  Treatment complicated by repeated hospitalizations for uncontrolled blood sugars.  He has been frequently hospitalized thereby causing multiple interruptions in his treatment. Due to lack of adequate response to Vrd darzalex  was added in may 2024. M protein remained around 2 g despite adding Darzalex  to the  regimen and therefore plan to switch to Pomalyst  plus Darzalex  and stop Velcade  and Revlimid  altogether starting August 2024  Interval history-patient had a fall while getting out of his house and fell on his left arm and sustainedA minimally displaced placed comminuted fracture of the neck of the humerus.  He has an immobilization sleeve for the same.  ECOG PS- 2 Pain scale- 8 Opioid associated constipation- no  Review of systems- Review of Systems   Constitutional:  Positive for malaise/fatigue. Negative for chills, fever and weight loss.  HENT:  Negative for congestion, ear discharge and nosebleeds.   Eyes:  Negative for blurred vision.  Respiratory:  Negative for cough, hemoptysis, sputum production, shortness of breath and wheezing.   Cardiovascular:  Negative for chest pain, palpitations, orthopnea and claudication.  Gastrointestinal:  Negative for abdominal pain, blood in stool, constipation, diarrhea, heartburn, melena, nausea and vomiting.  Genitourinary:  Negative for dysuria, flank pain, frequency, hematuria and urgency.  Musculoskeletal:  Negative for back pain, joint pain and myalgias.       Left arm pain  Skin:  Negative for rash.  Neurological:  Negative for dizziness, tingling, focal weakness, seizures, weakness and headaches.  Endo/Heme/Allergies:  Does not bruise/bleed easily.  Psychiatric/Behavioral:  Negative for depression and suicidal ideas. The patient does not have insomnia.       Allergies  Allergen Reactions   Penicillins Other (See Comments)    Childhood allergy -tolerated amoxil  03/2022 Unknown reaction Has patient had a PCN reaction causing immediate rash, facial/tongue/throat swelling, SOB or lightheadedness with hypotension: No Has patient had a PCN reaction causing severe rash involving mucus membranes or skin necrosis: No Has patient had a PCN reaction that required hospitalization No Has patient had a PCN reaction occurring within the last 10 years: No If all of the above answers are NO, then may proceed with Cephalosporin use.      Past Medical History:  Diagnosis Date   Acute metabolic encephalopathy 12/02/2020   DKA (diabetic ketoacidoses) 04/06/2016   Hypercholesteremia    Hypertension      Past Surgical History:  Procedure Laterality Date   AMPUTATION TOE Left 04/25/2022   Procedure: LEFT GREAT TOE AMPUTATION AND LEFT PARTIAL 2ND TOE AMPUTATION;  Surgeon: Dot Gazella, DPM;   Location: [facility] ORS;  Service: Podiatry;  Laterality: Left;   COLONOSCOPY WITH PROPOFOL  N/A 02/01/2020   Procedure: COLONOSCOPY WITH PROPOFOL ;  Surgeon: Selena Daily, MD;  Location: [facility] ENDOSCOPY;  Service: Gastroenterology;  Laterality: N/A;   ESOPHAGOGASTRODUODENOSCOPY  02/01/2020   Procedure: ESOPHAGOGASTRODUODENOSCOPY (EGD);  Surgeon: Selena Daily, MD;  Location: [facility] ENDOSCOPY;  Service: Gastroenterology;;   ESOPHAGOGASTRODUODENOSCOPY (EGD) WITH PROPOFOL  N/A 06/07/2022   Procedure: ESOPHAGOGASTRODUODENOSCOPY (EGD) WITH PROPOFOL ;  Surgeon: Genell Ken, MD;  Location: [facility] ENDOSCOPY;  Service: Gastroenterology;  Laterality: N/A;   KNEE SURGERY Right    Torn meniscus   KNEE SURGERY Left     Social History   Socioeconomic History   Marital status: Single    Spouse name: Not on file   Number of children: 3   Years of education: Not on file   Highest education level: High school graduate  Occupational History    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  Tobacco Use   Smoking status: Never   Smokeless tobacco: Never  Vaping Use   Vaping status: Never Used  Substance and Sexual Activity   Alcohol use: Not Currently    Alcohol/week: 0.0 - 1.0 standard drinks of alcohol    Comment:  once every 2 months   Drug use: Yes    Types: Marijuana, Crack cocaine    Comment: last week   Sexual activity: Not Currently    Birth control/protection: None  Other Topics Concern   Not on file  Social History Narrative   Lives with girlfriend sharon   Social Drivers of Health   Financial Resource Strain: Low Risk  (05/19/2023)   Received from [facility]   Overall Financial Resource Strain (CARDIA)    Difficulty of Paying Living Expenses: Not hard at all  Food Insecurity: No Food Insecurity (08/28/2023)   Hunger Vital Sign    Worried About Running Out of Food in the Last Year: Never true    Ran Out of Food in the Last Year: Never true  Transportation Needs: No  Transportation Needs (08/28/2023)   PRAPARE - Administrator, Civil Service (Medical): No    Lack of Transportation (Non-Medical): No  Physical Activity: Insufficiently Active (01/01/2020)   Exercise Vital Sign    Days of Exercise per Week: 7 days    Minutes of Exercise per Session: 20 min  Stress: No Stress Concern Present (01/01/2020)   [employer] of Occupational Health - Occupational Stress Questionnaire    Feeling of Stress : Not at all  Social Connections: Patient Declined (08/28/2023)   Social Connection and Isolation Panel    Frequency of Communication with Friends and Family: Patient declined    Frequency of Social Gatherings with Friends and Family: Patient declined    Attends Religious Services: Patient declined    Database administrator or Organizations: Patient declined    Attends Banker Meetings: Patient declined    Marital Status: Patient declined  Intimate Partner Violence: Not At Risk (08/28/2023)   Humiliation, Afraid, Rape, and Kick questionnaire    Fear of Current or Ex-Partner: No    Emotionally Abused: No    Physically Abused: No    Sexually Abused: No    Family History  Problem Relation Age of Onset   Heart attack Father    Hypertension Sister    Cancer Sister      Current Outpatient Medications:    acyclovir  (ZOVIRAX ) 400 MG tablet, Take 1 tablet (400 mg total) by mouth 2 (two) times daily., Disp: 60 tablet, Rfl: 2   apixaban  (ELIQUIS ) 5 MG TABS tablet, Take 1 tablet (5 mg total) by mouth 2 (two) times daily. Start Eliquis  after 2 weeks which will be 03/02/2023, Disp: , Rfl:    ascorbic acid  (VITAMIN C ) 500 MG tablet, Take 1 tablet (500 mg total) by mouth daily., Disp: 90 tablet, Rfl: 0   atorvastatin  (LIPITOR ) 80 MG tablet, Take 1 tablet (80 mg total) by mouth at bedtime. Hold while taking Paxlovid , Disp: 30 tablet, Rfl: 2   bisacodyl  5 MG EC tablet, Take 1 tablet (5 mg total) by mouth at bedtime. Skip the dose if no  constipation, Disp: , Rfl:    Continuous Glucose Sensor (DEXCOM G7 SENSOR) MISC, 1 each by Does not apply route 4 (four) times daily -  before meals and at bedtime., Disp: 1 each, Rfl: 0   fludrocortisone  (FLORINEF ) 0.1 MG tablet, Take 1 tablet (0.1 mg total) by mouth daily. Skip the dose if SBP >130, Disp: , Rfl:    gabapentin  (NEURONTIN ) 300 MG capsule, Take 300 mg by mouth 2 (two) times daily., Disp: , Rfl:    Insulin  Aspart FlexPen (NOVOLOG ) 100 UNIT/ML, Sliding Scale: Glucose 70 -  120: 0 units Glucose 121 - 150: 1 unit Glucose 151 - 200: 2 units Glucose 201 - 250: 3 units Glucose 251 - 300: 5 units Glucose 301 - 350: 7 units Glucose 351 - 400: 9 units Glucose > 400: call your docotr, Disp: , Rfl:    insulin  degludec (TRESIBA  FLEXTOUCH) 100 UNIT/ML FlexTouch Pen, Inject 5 Units into the skin daily. Home med., Disp: , Rfl:    iron  polysaccharides (NIFEREX) 150 MG capsule, Take 1 capsule (150 mg total) by mouth daily., Disp: , Rfl:    levETIRAcetam  (KEPPRA ) 500 MG tablet, Take 1 tablet by mouth twice daily, Disp: 60 tablet, Rfl: 0   midodrine  (PROAMATINE ) 2.5 MG tablet, Take 2.5 mg by mouth 3 (three) times daily., Disp: , Rfl:    Multiple Vitamin (MULTIVITAMIN WITH MINERALS) TABS tablet, Take 1 tablet by mouth daily., Disp: 90 tablet, Rfl: 1   ondansetron  (ZOFRAN ) 8 MG tablet, Take 8 mg by mouth daily as needed for nausea or vomiting., Disp: , Rfl:    oxyCODONE -acetaminophen  (PERCOCET) 5-325 MG tablet, Take 1 tablet by mouth every 4 (four) hours as needed for severe pain (pain score 7-10)., Disp: 12 tablet, Rfl: 0   pantoprazole  (PROTONIX ) 40 MG tablet, Take 40 mg by mouth daily., Disp: , Rfl:    polyethylene glycol (MIRALAX ) 17 g packet, Take 17 g by mouth 2 (two) times daily. Skip the dose if no constipation, Disp: , Rfl:    POMALYST  2 MG capsule, TAKE 1 CAPSULE BY MOUTH EVERY DAY FOR 21 DAYS THEN HOLD FOR 7 DAYS. REPEAT EVERY 28 DAYS., Disp: 21 capsule, Rfl: 0 No current facility-administered  medications for this visit.  Facility-Administered Medications Ordered in Other Visits:    epoetin  alfa-epbx (RETACRIT ) injection 40,000 Units, 40,000 Units, Subcutaneous, Q21 days, Avonne Boettcher, MD, 40,000 Units at 07/09/23 1153   epoetin  alfa-epbx (RETACRIT ) injection 40,000 Units, 40,000 Units, Subcutaneous, Q21 days, Avonne Boettcher, MD, 40,000 Units at 09/10/23 1001   oxyCODONE  (Oxy IR/ROXICODONE ) immediate release tablet 10 mg, 10 mg, Oral, Once, Avonne Boettcher, MD  Physical exam:  Vitals:   09/10/23 0908  BP: 114/61  Pulse: 67  Resp: 19  Temp: (!) 96.6 F (35.9 C)  TempSrc: Tympanic  SpO2: 99%  Weight: 145 lb 11.2 oz (66.1 kg)  Height: 5' 10 (1.778 m)   Physical Exam  Cardiovascular:     Rate and Rhythm: Normal rate and regular rhythm.     Heart sounds: Normal heart sounds.  Pulmonary:     Effort: Pulmonary effort is normal.     Breath sounds: Normal breath sounds.  Abdominal:     General: Bowel sounds are normal.     Palpations: Abdomen is soft.   Skin:    General: Skin is warm and dry.   Neurological:     Mental Status: He is alert and oriented to person, place, and time.      I have personally reviewed labs listed below:    Latest Ref Rng & Units 09/10/2023    8:55 AM  CMP  Glucose 70 - 99 mg/dL 119   BUN 8 - 23 mg/dL 32   Creatinine 1.47 - 1.24 mg/dL 8.29   Sodium 562 - 130 mmol/L 138   Potassium 3.5 - 5.1 mmol/L 4.3   Chloride 98 - 111 mmol/L 106   CO2 22 - 32 mmol/L 24   Calcium  8.9 - 10.3 mg/dL 8.4   Total Protein 6.5 - 8.1 g/dL 7.8  Total Bilirubin 0.0 - 1.2 mg/dL 0.5   Alkaline Phos 38 - 126 U/L 74   AST 15 - 41 U/L 24   ALT 0 - 44 U/L 33       Latest Ref Rng & Units 09/10/2023    8:55 AM  CBC  WBC 4.0 - 10.5 K/uL 2.7   Hemoglobin 13.0 - 17.0 g/dL 8.3   Hematocrit 96.0 - 52.0 % 26.4   Platelets 150 - 400 K/uL 241    I have personally reviewed Radiology images listed below: No images are attached to the encounter.  DG Shoulder  Left Result Date: 09/09/2023 CLINICAL DATA:  Status post fall onto the left shoulder EXAM: LEFT SHOULDER - 3 VIEW COMPARISON:  None Available. FINDINGS: Comminuted fracture of the left humeral neck with 1 cortical width medial displacement. No acute dislocation of the glenohumeral joint. Degenerative changes of the shoulder. IMPRESSION: Minimally displaced, comminuted fracture of the left humeral neck. Electronically Signed   By: Limin  Xu M.D.   On: 09/09/2023 13:30   DG Abd 1 View Result Date: 08/30/2023 CLINICAL DATA:  Constipation EXAM: ABDOMEN - 1 VIEW COMPARISON:  None Available. FINDINGS: Normal colonic stool burden. Nonobstructive bowel gas pattern. Visualized osseous structures are within normal limits. IMPRESSION: Negative.  Normal colonic stool burden. Electronically Signed   By: Zadie Herter M.D.   On: 08/30/2023 21:06   EEG adult Result Date: 08/27/2023 Arleene Lack, MD     08/27/2023  4:32 PM Patient Name: ALPHONZA TRAMELL MRN: [id number] Epilepsy Attending: Arleene Lack Referring Physician/Provider: [place], MD Date: 08/27/2023 Duration: 31.23 mins Patient history: 73yo M with h/o seizures came with syncope. EEG to evaluate for seizure Level of alertness: Awake, asleep AEDs during EEG study: LEV, GBP Technical aspects: This EEG study was done with scalp electrodes positioned according to the 10-20 International system of electrode placement. Electrical activity was reviewed with band pass filter of 1-70Hz , sensitivity of 7 uV/mm, display speed of 36mm/sec with a 60Hz  notched filter applied as appropriate. EEG data were recorded continuously and digitally stored.  Video monitoring was available and reviewed as appropriate. Description: The posterior dominant rhythm consists of 7.5 Hz activity of moderate voltage (25-35 uV) seen predominantly in posterior head regions, symmetric and reactive to eye opening and eye closing. Sleep was characterized by sleep spindles (12 to 14 Hz),  maximal frontocentral region. EEG showed continuous generalized 5 to 7 Hz theta slowing admixed with intermittent generalized 2-3 Hz delta slowing, at times with triphasic morphology. Hyperventilation and photic stimulation were not performed.    ABNORMALITY - Continuous slow, generalized  IMPRESSION: This study is suggestive of moderate diffuse encephalopathy. No seizures or epileptiform discharges were seen throughout the recording.  Priyanka Suzanne Erps    CT HEAD WO CONTRAST ( ) Result Date: 08/27/2023 CLINICAL DATA:  73 year old male status post fall with hypodense left subdural hematoma. EXAM: CT HEAD WITHOUT CONTRAST TECHNIQUE: Contiguous axial images were obtained from the base of the skull through the vertex without intravenous contrast. RADIATION DOSE REDUCTION: This exam was performed according to the departmental dose-optimization program which includes automated exposure control, adjustment of the mA and/or kV according to patient size and/or use of iterative reconstruction technique. COMPARISON:  Head CT 0015 hours today. FINDINGS: Brain: Redemonstrated left side subdural hematoma, hyperdense blood products noted along the left frontal operculum level on coronal image 34. Left SDH thickness is 4-5 mm and not significantly changed. Smaller contralateral and mostly superior convexity low-density  right side subdural hematoma also noted, increased in conspicuity but small measuring 2-3 mm (coronal image 45). Also there is hyperdense para falcine subdural blood, small volume (coronal image 34) up to 3 mm and stable superimposed on dural calcification. Trace rightward midline shift (series 6, image 15). Ventricle size and configuration remains normal. No IVH. No subarachnoid or intra-axial blood. Stable gray-white matter differentiation throughout the brain. No cortically based acute infarct identified. Vascular: Calcified atherosclerosis at the skull base. No suspicious intracranial vascular hyperdensity.  Skull: Stable.  No skull fracture identified. Sinuses/Orbits: Visualized paranasal sinuses and mastoids are stable and well aerated. Other: Leftward gaze now. No acute orbit or scalp soft tissue injury is identified. IMPRESSION: 1. Positive for small Bilateral Subdural Hematomas: - right side mixed density SDH is 4-5 mm thickness and not significantly changed from 0015 hours. - left SDH is low-density and smaller, 2-3 mm, more conspicuous since the prior exam. - small hyperdense right parafalcine SDH is stable, 3 mm. 2. No skull fracture identified. No significant intracranial mass effect at this time, trace rightward midline shift. Electronically Signed   By: Marlise Simpers M.D.   On: 08/27/2023 06:19   CT Cervical Spine Wo Contrast Result Date: 08/27/2023 CLINICAL DATA:  Fall.  Neck trauma (Age >= 65y) EXAM: CT CERVICAL SPINE WITHOUT CONTRAST TECHNIQUE: Multidetector CT imaging of the cervical spine was performed without intravenous contrast. Multiplanar CT image reconstructions were also generated. RADIATION DOSE REDUCTION: This exam was performed according to the departmental dose-optimization program which includes automated exposure control, adjustment of the mA and/or kV according to patient size and/or use of iterative reconstruction technique. COMPARISON:  07/21/2023 FINDINGS: Alignment: Normal Skull base and vertebrae: No acute fracture. No primary bone lesion or focal pathologic process. Soft tissues and spinal canal: No prevertebral fluid or swelling. No visible canal hematoma. Disc levels: Degenerative disc disease changes at C5-6 and C6-7 with disc space narrowing and spurring. Moderate degenerative facet disease, left greater than right. Upper chest: No acute findings Other: None IMPRESSION: Degenerative changes.  No acute bony abnormality. Electronically Signed   By: Janeece Mechanic M.D.   On: 08/27/2023 00:29   CT HEAD WO CONTRAST ( ) Result Date: 08/27/2023 CLINICAL DATA:  Head trauma, minor (Age >=  65y).  Fall. EXAM: CT HEAD WITHOUT CONTRAST TECHNIQUE: Contiguous axial images were obtained from the base of the skull through the vertex without intravenous contrast. RADIATION DOSE REDUCTION: This exam was performed according to the departmental dose-optimization program which includes automated exposure control, adjustment of the mA and/or kV according to patient size and/or use of iterative reconstruction technique. COMPARISON:  07/21/2023 FINDINGS: Brain: There is a low-density to isodense subdural fluid collection noted over the left cerebral convexity, new since prior study concerning for isodense subdural hematoma. This measures approximately 5 mm in thickness on coronal image 48, series 4. No mass effect or midline shift. No hydrocephalus. No intracerebral hemorrhage or acute infarction. Again noted are extensive calcifications along the falx and tentorium. Vascular: No hyperdense vessel or unexpected calcification. Skull: No acute calvarial abnormality. Sinuses/Orbits: No acute findings Other: None IMPRESSION: Low-density to isodense subdural fluid collection measuring 5 mm in thickness over the left cerebral convexity concerning for small subdural hematoma. This is age indeterminate but new since prior study suggesting acute or subacute. No mass effect or midline shift. Electronically Signed   By: Janeece Mechanic M.D.   On: 08/27/2023 00:27   Portable chest x-ray (1 view) Result Date: 08/15/2023 CLINICAL DATA:  [id number]  DKA (diabetic ketoacidosis) (HCC) [id number] EXAM: PORTABLE CHEST 1 VIEW COMPARISON:  April 05, 2023 FINDINGS: The cardiomediastinal silhouette is unchanged in contour.Atherosclerotic calcifications. Apices are obscured by overlapping chin. No pleural effusion. No pneumothorax. No acute pleuroparenchymal abnormality. Remote RIGHT-sided rib fractures. IMPRESSION: No acute cardiopulmonary abnormality. Electronically Signed   By: Clancy Crimes M.D.   On: 08/15/2023 17:49     Assessment  and plan- Patient is a 73 y.o. male with history of IgG lambda multiple myeloma R-ISS stage III here for on treatment assessment prior to cycle 10 of Darzalex   Okay to proceed with Darzalex  today.  He will continue to take Pomalyst  2 mg 3 weeks on and 1 week off.Myeloma numbers have not worsened but they have not significantly improved either.  Levels from today are pending.  Overall based on levels from last month myeloma panel still showed M protein of 2.7 g unchanged as compared to 7 months ago.  Serum free lambda light chain has remained between 130s to 160s over the last 5 months.  I am somewhat hesitant to switch him to carfilzomib yet given the potential for cardiotoxicity.  Normocytic anemia: Likely multifactorial secondary to chronic disease and chronic kidney disease.  He will receive Retacrit  today and monthly.Patient is not currently on any bisphosphonates and PET scan from March 2025 did not show any evidence of active myeloma in his bones.  Moreover he has chronic hypocalcemia as well.  Continue aspirin  prophylaxis   Visit Diagnosis 1. Multiple myeloma not having achieved remission (HCC)   2. Encounter for antineoplastic chemotherapy   3. High risk medication use   4. Erythropoietin  (EPO) stimulating agent anemia management patient      Dr. Seretha Dance, MD, MPH [facility] [phone] 09/10/2023 12:30 PM

## 2023-09-10 NOTE — Progress Notes (Signed)
 Patient is currently dealing with a left arm fracture, that happened due to a fall. On pain scale 1-10, he says it 8. Other than arm fracture, he is doing pretty good.

## 2023-09-11 ENCOUNTER — Other Ambulatory Visit: Payer: Self-pay

## 2023-09-11 ENCOUNTER — Emergency Department
Admission: EM | Admit: 2023-09-11 | Discharge: 2023-09-12 | Disposition: A | Discharge status: Home / Self Care | Attending: Emergency Medicine | Admitting: Emergency Medicine

## 2023-09-11 ENCOUNTER — Emergency Department

## 2023-09-11 DIAGNOSIS — G40909 Epilepsy, unspecified, not intractable, without status epilepticus: Secondary | ICD-10-CM | POA: Insufficient documentation

## 2023-09-11 DIAGNOSIS — R296 Repeated falls: Secondary | ICD-10-CM | POA: Insufficient documentation

## 2023-09-11 DIAGNOSIS — E119 Type 2 diabetes mellitus without complications: Secondary | ICD-10-CM | POA: Diagnosis not present

## 2023-09-11 DIAGNOSIS — C9000 Multiple myeloma not having achieved remission: Secondary | ICD-10-CM | POA: Diagnosis not present

## 2023-09-11 DIAGNOSIS — W19XXXA Unspecified fall, initial encounter: Secondary | ICD-10-CM

## 2023-09-11 DIAGNOSIS — I10 Essential (primary) hypertension: Secondary | ICD-10-CM | POA: Diagnosis not present

## 2023-09-11 DIAGNOSIS — R531 Weakness: Secondary | ICD-10-CM | POA: Diagnosis not present

## 2023-09-11 DIAGNOSIS — Z79899 Other long term (current) drug therapy: Secondary | ICD-10-CM | POA: Insufficient documentation

## 2023-09-11 DIAGNOSIS — Z7901 Long term (current) use of anticoagulants: Secondary | ICD-10-CM | POA: Diagnosis not present

## 2023-09-11 LAB — URINE DRUG SCREEN, QUALITATIVE (ARMC ONLY)
Amphetamines, Ur Screen: NOT DETECTED
Barbiturates, Ur Screen: NOT DETECTED
Benzodiazepine, Ur Scrn: NOT DETECTED
Cannabinoid 50 Ng, Ur ~~LOC~~: NOT DETECTED
Cocaine Metabolite,Ur ~~LOC~~: NOT DETECTED
MDMA (Ecstasy)Ur Screen: NOT DETECTED
Methadone Scn, Ur: NOT DETECTED
Opiate, Ur Screen: NOT DETECTED
Phencyclidine (PCP) Ur S: NOT DETECTED
Tricyclic, Ur Screen: NOT DETECTED

## 2023-09-11 LAB — CBG MONITORING, ED
Glucose-Capillary: 127 mg/dL — ABNORMAL HIGH (ref 70–99)
Glucose-Capillary: 80 mg/dL (ref 70–99)

## 2023-09-11 LAB — URINALYSIS, COMPLETE (UACMP) WITH MICROSCOPIC
Bacteria, UA: NONE SEEN
Bilirubin Urine: NEGATIVE
Glucose, UA: >=500 mg/dL — AB
Hgb urine dipstick: NEGATIVE
Ketones, ur: NEGATIVE mg/dL
Leukocytes,Ua: NEGATIVE
Nitrite: NEGATIVE
Protein, ur: NEGATIVE mg/dL
Specific Gravity, Urine: 1.018 (ref 1.005–1.030)
Squamous Epithelial / HPF: 0 /HPF (ref 0–5)
pH: 5 (ref 5.0–8.0)

## 2023-09-11 LAB — CBC
HCT: 25.1 % — ABNORMAL LOW (ref 39.0–52.0)
Hemoglobin: 8.1 g/dL — ABNORMAL LOW (ref 13.0–17.0)
MCH: 31 pg (ref 26.0–34.0)
MCHC: 32.3 g/dL (ref 30.0–36.0)
MCV: 96.2 fL (ref 80.0–100.0)
Platelets: 209 10*3/uL (ref 150–400)
RBC: 2.61 MIL/uL — ABNORMAL LOW (ref 4.22–5.81)
RDW: 17.4 % — ABNORMAL HIGH (ref 11.5–15.5)
WBC: 3.5 10*3/uL — ABNORMAL LOW (ref 4.0–10.5)
nRBC: 0 % (ref 0.0–0.2)

## 2023-09-11 LAB — ETHANOL: Alcohol, Ethyl (B): <15 mg/dL (ref <15)

## 2023-09-11 LAB — COMPREHENSIVE METABOLIC PANEL WITH GFR
ALT: 29 U/L (ref 0–44)
AST: 24 U/L (ref 15–41)
Albumin: 3.2 g/dL — ABNORMAL LOW (ref 3.5–5.0)
Alkaline Phosphatase: 63 U/L (ref 38–126)
Anion gap: 9 (ref 5–15)
BUN: 36 mg/dL — ABNORMAL HIGH (ref 8–23)
CO2: 23 mmol/L (ref 22–32)
Calcium: 8.6 mg/dL — ABNORMAL LOW (ref 8.9–10.3)
Chloride: 104 mmol/L (ref 98–111)
Creatinine, Ser: 1.54 mg/dL — ABNORMAL HIGH (ref 0.61–1.24)
GFR, Estimated: 48 mL/min — ABNORMAL LOW (ref >60)
Glucose, Bld: 361 mg/dL — ABNORMAL HIGH (ref 70–99)
Potassium: 4 mmol/L (ref 3.5–5.1)
Sodium: 136 mmol/L (ref 135–145)
Total Bilirubin: 0.6 mg/dL (ref 0.0–1.2)
Total Protein: 7.9 g/dL (ref 6.5–8.1)

## 2023-09-11 LAB — RESP PANEL BY RT-PCR (RSV, FLU A&B, COVID)  RVPGX2
Influenza A by PCR: NEGATIVE
Influenza B by PCR: NEGATIVE
Resp Syncytial Virus by PCR: NEGATIVE
SARS Coronavirus 2 by RT PCR: NEGATIVE

## 2023-09-11 LAB — AMMONIA: Ammonia: 14 umol/L (ref 9–35)

## 2023-09-11 MED ORDER — SODIUM CHLORIDE 0.9 % IV BOLUS
1000.0000 mL | Freq: Once | INTRAVENOUS | Status: AC
  Administered 2023-09-11: 1000 mL via INTRAVENOUS

## 2023-09-11 MED ORDER — INSULIN ASPART 100 UNIT/ML IJ SOLN
8.0000 [IU] | Freq: Once | INTRAMUSCULAR | Status: AC
  Administered 2023-09-11: 8 [IU] via INTRAVENOUS
  Filled 2023-09-11: qty 1

## 2023-09-11 NOTE — ED Triage Notes (Signed)
 Per family patient has had multiple falls today and has a change in mental status; history of orthostatic positive.

## 2023-09-11 NOTE — ED Notes (Signed)
 Patient transported to CT

## 2023-09-11 NOTE — ED Provider Notes (Addendum)
 [facility] Provider Note    Event Date/Time   First MD Initiated Contact with Patient 09/11/23 1846     (approximate)  History   Chief Complaint: Fall  HPI  Nicholas Weiss is a 73 y.o. male with a past medical history of diabetes, hypertension, hyperlipidemia, proximal atrial fibrillation on Eliquis , multiple myeloma on chronic chemotherapy, seizure disorder on Keppra , presents to the emergency department for multiple falls.  In reviewing the patient's chart including a discharge summary 6/3 patient has a history of orthostasis as well as hypotension on midodrine .  EMS reports multiple falls at home today.  In reviewing the patient's chart he was discharged from hospital 6/3 after an admission for falls.  Patient was seen in the emergency department in the morning 6/12 and again in the afternoon on 6/12 for falls and confusion.  Family states patient had multiple falls today so they brought the patient to the emergency department via EMS for evaluation.  Here patient denies any pain.  He is not sure if he hit his head.  Vital signs are reassuring with a blood pressure 138/91.  Physical Exam   Triage Vital Signs: ED Triage Vitals  Encounter Vitals Group     BP 09/11/23 1852 (!) 138/91     Girls Systolic BP Percentile --      Girls Diastolic BP Percentile --      Boys Systolic BP Percentile --      Boys Diastolic BP Percentile --      Pulse Rate 09/11/23 1848 91     Resp 09/11/23 1848 12     Temp 09/11/23 1848 97.6 F (36.4 C)     Temp Source 09/11/23 1848 Oral     SpO2 09/11/23 1848 100 %     Weight --      Height --      Head Circumference --      Peak Flow --      Pain Score 09/11/23 1844 0     Pain Loc --      Pain Education --      Exclude from Growth Chart --     Most recent vital signs: Vitals:   09/11/23 1848 09/11/23 1852  BP:  (!) 138/91  Pulse: 91   Resp: 12   Temp: 97.6 F (36.4 C)   SpO2: 100%     General: Awake, no distress.   No signs of head trauma. CV:  Good peripheral perfusion.  Regular rate and rhythm  Resp:  Normal effort.  Equal breath sounds bilaterally.  Abd:  No distention.  Soft, nontender.  No rebound or guarding.  ED Results / Procedures / Treatments   RADIOLOGY  I have reviewed and interpreted the CT images of the head.  Patient had some calcifications likely meningiomas but I do not appreciate any acute bleed. Radiology has read the CT scan as improved with no acute abnormality.   MEDICATIONS ORDERED IN ED: Medications  sodium chloride  0.9 % bolus 1,000 mL (has no administration in time range)     IMPRESSION / MDM / ASSESSMENT AND PLAN / ED COURSE  I reviewed the triage vital signs and the nursing notes.  Patient's presentation is most consistent with acute presentation with potential threat to life or bodily function.  Patient presents emergency department for multiple falls.  Patient has a history of orthostatic hypotension currently on midodrine .  Blood pressure reassuring in the emergency department.  Patient has reportedly had 2-3 falls  today.  We will check labs will obtain a CT scan of the head given the multiple falls we will obtain a urine sample we will IV hydrate we will continue to closely monitor while awaiting further results.  Patient agreeable to plan of care.    Patient's workup overall reassuring urine drug screen is negative urinalysis is normal.  Will recheck a fingerstick after the patient has received insulin  and fluids.  CT scan is negative.  Patient states he is feeling well and is ready to go home.  Patient has a history of orthostasis and is prescribed midodrine  at home.  Suspect this could be the cause of the patient's falls and intermittent weakness.  Given the patient's reassuring workup I believe the patient will be safe for discharge with PCP follow-up this week.  Blood sugar down to 127 on recheck.  FINAL CLINICAL IMPRESSION(S) / ED DIAGNOSES    Fall Weakness   Note:  This document was prepared using Dragon voice recognition software and may include unintentional dictation errors.   Ruth Cove, MD 09/11/23 2313    Ruth Cove, MD 09/11/23 2328

## 2023-09-13 ENCOUNTER — Ambulatory Visit
Admission: RE | Admit: 2023-09-13 | Discharge: 2023-09-13 | Disposition: A | Discharge status: Home / Self Care | Source: Ambulatory Visit | Attending: Neurosurgery | Admitting: Neurosurgery

## 2023-09-13 ENCOUNTER — Emergency Department
Admission: EM | Admit: 2023-09-13 | Discharge: 2023-09-13 | Disposition: A | Discharge status: Home / Self Care | Source: Home / Self Care | Attending: Emergency Medicine | Admitting: Emergency Medicine

## 2023-09-13 ENCOUNTER — Other Ambulatory Visit: Payer: Self-pay

## 2023-09-13 DIAGNOSIS — E860 Dehydration: Secondary | ICD-10-CM | POA: Diagnosis not present

## 2023-09-13 DIAGNOSIS — I951 Orthostatic hypotension: Secondary | ICD-10-CM | POA: Diagnosis not present

## 2023-09-13 DIAGNOSIS — D72819 Decreased white blood cell count, unspecified: Secondary | ICD-10-CM | POA: Insufficient documentation

## 2023-09-13 DIAGNOSIS — I959 Hypotension, unspecified: Secondary | ICD-10-CM | POA: Insufficient documentation

## 2023-09-13 DIAGNOSIS — S065XAA Traumatic subdural hemorrhage with loss of consciousness status unknown, initial encounter: Secondary | ICD-10-CM | POA: Insufficient documentation

## 2023-09-13 LAB — MULTIPLE MYELOMA PANEL, SERUM
Albumin SerPl Elph-Mcnc: 3.2 g/dL (ref 2.9–4.4)
Albumin/Glob SerPl: 0.8 (ref 0.7–1.7)
Alpha 1: 0.2 g/dL (ref 0.0–0.4)
Alpha2 Glob SerPl Elph-Mcnc: 0.7 g/dL (ref 0.4–1.0)
B-Globulin SerPl Elph-Mcnc: 0.7 g/dL (ref 0.7–1.3)
Gamma Glob SerPl Elph-Mcnc: 2.7 g/dL — ABNORMAL HIGH (ref 0.4–1.8)
Globulin, Total: 4.4 g/dL — ABNORMAL HIGH (ref 2.2–3.9)
IgA: 11 mg/dL — ABNORMAL LOW (ref 61–437)
IgG (Immunoglobin G), Serum: 3459 mg/dL — ABNORMAL HIGH (ref 603–1613)
IgM (Immunoglobulin M), Srm: <5 mg/dL — ABNORMAL LOW (ref 20–172)
M Protein SerPl Elph-Mcnc: 2.2 g/dL — ABNORMAL HIGH
Total Protein ELP: 7.6 g/dL (ref 6.0–8.5)

## 2023-09-13 LAB — BASIC METABOLIC PANEL WITH GFR
Anion gap: 10 (ref 5–15)
BUN: 27 mg/dL — ABNORMAL HIGH (ref 8–23)
CO2: 23 mmol/L (ref 22–32)
Calcium: 8.6 mg/dL — ABNORMAL LOW (ref 8.9–10.3)
Chloride: 108 mmol/L (ref 98–111)
Creatinine, Ser: 1.31 mg/dL — ABNORMAL HIGH (ref 0.61–1.24)
GFR, Estimated: 59 mL/min — ABNORMAL LOW (ref >60)
Glucose, Bld: 103 mg/dL — ABNORMAL HIGH (ref 70–99)
Potassium: 4.3 mmol/L (ref 3.5–5.1)
Sodium: 141 mmol/L (ref 135–145)

## 2023-09-13 LAB — CBC WITH DIFFERENTIAL/PLATELET
Abs Immature Granulocytes: 0.02 10*3/uL (ref 0.00–0.07)
Basophils Absolute: 0 10*3/uL (ref 0.0–0.1)
Basophils Relative: 1 %
Eosinophils Absolute: 0.1 10*3/uL (ref 0.0–0.5)
Eosinophils Relative: 3 %
HCT: 26.5 % — ABNORMAL LOW (ref 39.0–52.0)
Hemoglobin: 8.4 g/dL — ABNORMAL LOW (ref 13.0–17.0)
Immature Granulocytes: 1 %
Lymphocytes Relative: 23 %
Lymphs Abs: 0.9 10*3/uL (ref 0.7–4.0)
MCH: 30.5 pg (ref 26.0–34.0)
MCHC: 31.7 g/dL (ref 30.0–36.0)
MCV: 96.4 fL (ref 80.0–100.0)
Monocytes Absolute: 0.5 10*3/uL (ref 0.1–1.0)
Monocytes Relative: 14 %
Neutro Abs: 2.2 10*3/uL (ref 1.7–7.7)
Neutrophils Relative %: 58 %
Platelets: 260 10*3/uL (ref 150–400)
RBC: 2.75 MIL/uL — ABNORMAL LOW (ref 4.22–5.81)
RDW: 17.8 % — ABNORMAL HIGH (ref 11.5–15.5)
WBC: 3.8 10*3/uL — ABNORMAL LOW (ref 4.0–10.5)
nRBC: 0 % (ref 0.0–0.2)

## 2023-09-13 LAB — KAPPA/LAMBDA LIGHT CHAINS
Kappa free light chain: 6.7 mg/L (ref 3.3–19.4)
Kappa, lambda light chain ratio: 0.03 — ABNORMAL LOW (ref 0.26–1.65)
Lambda free light chains: 262 mg/L — ABNORMAL HIGH (ref 5.7–26.3)

## 2023-09-13 LAB — TROPONIN I (HIGH SENSITIVITY)
Troponin I (High Sensitivity): 21 ng/L — ABNORMAL HIGH (ref <18)
Troponin I (High Sensitivity): 19 ng/L — ABNORMAL HIGH (ref <18)

## 2023-09-13 NOTE — ED Notes (Signed)
 Lab called to collect blood work due to patient being difficult stick

## 2023-09-13 NOTE — ED Notes (Signed)
 Lab at bedside

## 2023-09-13 NOTE — ED Notes (Signed)
 Lab was at pt bedside and stated that was able to get the blood

## 2023-09-13 NOTE — ED Provider Notes (Signed)
 [facility] Provider Note    Event Date/Time   First MD Initiated Contact with Patient 09/13/23 1718     (approximate)   History   Hypotension   HPI  Nicholas Weiss is a 73 y.o. male  who presents to the emergency department today after his wife had difficulty awakening him this afternoon.  The patient had gone to get an outpatient CT to follow-up on a head bleed.  Apparently after getting home from the CT wife treated with Coumadin.  Had a hard time.  Asked when she called 911.  When EMS arrived he was awake but they found him to have a low blood pressure.  Per chart review the patient was seen in the ED 2 days ago after a fall.  Patient denies any falls since that ED visit.  He is unsure why he is in the emergency department.  He states he has been feeling okay.  Denies any chest pain.     Physical Exam   Triage Vital Signs: ED Triage Vitals  Encounter Vitals Group     BP 09/13/23 1723 121/79     Girls Systolic BP Percentile --      Girls Diastolic BP Percentile --      Boys Systolic BP Percentile --      Boys Diastolic BP Percentile --      Pulse Rate 09/13/23 1723 73     Resp 09/13/23 1723 16     Temp 09/13/23 1723 98.1 F (36.7 C)     Temp Source 09/13/23 1723 Oral     SpO2 09/13/23 1723 98 %     Weight 09/13/23 1726 145 lb 11.2 oz (66.1 kg)     Height 09/13/23 1726 5' 10 (1.778 m)     Head Circumference --      Peak Flow --      Pain Score 09/13/23 1724 0     Pain Loc --      Pain Education --      Exclude from Growth Chart --     Most recent vital signs: Vitals:   09/13/23 1723  BP: 121/79  Pulse: 73  Resp: 16  Temp: 98.1 F (36.7 C)  SpO2: 98%   General: Awake, alert, oriented. CV:  Good peripheral perfusion. Regular rate and rhythm. Resp:  Normal effort. Lungs clear. Abd:  No distention.    ED Results / Procedures / Treatments   Labs (all labs ordered are listed, but only abnormal results are displayed) Labs  Reviewed  CBC WITH DIFFERENTIAL/PLATELET - Abnormal; Notable for the following components:      Result Value   WBC 3.8 (*)    RBC 2.75 (*)    Hemoglobin 8.4 (*)    HCT 26.5 (*)    RDW 17.8 (*)    All other components within normal limits  BASIC METABOLIC PANEL WITH GFR - Abnormal; Notable for the following components:   Glucose, Bld 103 (*)    BUN 27 (*)    Creatinine, Ser 1.31 (*)    Calcium  8.6 (*)    GFR, Estimated 59 (*)    All other components within normal limits  TROPONIN I (HIGH SENSITIVITY) - Abnormal; Notable for the following components:   Troponin I (High Sensitivity) 21 (*)    All other components within normal limits  TROPONIN I (HIGH SENSITIVITY) - Abnormal; Notable for the following components:   Troponin I (High Sensitivity) 19 (*)    All other  components within normal limits  URINALYSIS, ROUTINE W REFLEX MICROSCOPIC     EKG  I, Marylynn Soho, attending physician, personally viewed and interpreted this EKG  EKG Time: 1905 Rate: 72 Rhythm: sinus rhythm with 1st degree av block Axis: normal Intervals: qtc 616 QRS: narrow, q waves V1, v2 ST changes: no st elevation Impression: abnormal ekg    RADIOLOGY None  PROCEDURES:  Critical Care performed: No   MEDICATIONS ORDERED IN ED: Medications - No data to display   IMPRESSION / MDM / ASSESSMENT AND PLAN / ED COURSE  I reviewed the triage vital signs and the nursing notes.                              Differential diagnosis includes, but is not limited to, fatigue, anemia, electrolyte abnormality, hypotension  Patient's presentation is most consistent with acute presentation with potential threat to life or bodily function.   The patient is on the cardiac monitor to evaluate for evidence of arrhythmia and/or significant heart rate changes.  Patient presented to the emergency department today after his wife had not hard time waking him this afternoon.  The patient was awake alert and oriented  at the time of his arrival to the emergency department.  There was reported hypotension in the field by EMS however patient's blood pressure was not hypotensive in the emergency department.  Blood work does show anemia however this is consistent with previous results. Initial troponin slightly elevated, he does have history of elevated troponins in the past, on repeat it was slightly improved. I doubt ACS at this time. Patient continued to be awake and oriented on repeat checks. At this time patient stated he felt comfortable going home and I think this is reasonable.       FINAL CLINICAL IMPRESSION(S) / ED DIAGNOSES   Final diagnoses:  Hypotension, unspecified hypotension type       Note:  This document was prepared using Dragon voice recognition software and may include unintentional dictation errors.    Marylynn Soho, MD 09/13/23 [phone]

## 2023-09-13 NOTE — ED Triage Notes (Signed)
 Pt to ED via [facility] from home. Pt had head CT today, once back at home, pt's wife stated she could not wake pt up.Pt awake upon EMS arrival. Pt has hx of head bleed, recently restarted thinners. Initial EMS BP 70/40, BP now 121/79. Pt A&O.

## 2023-09-13 NOTE — ED Notes (Signed)
 Lab was contacted refer bloodwork still says needs to be collected... Lab stated that they haven't received the blood and that she was going to call the phlebotomist who drew it.

## 2023-09-13 NOTE — ED Notes (Signed)
 Lab contacted refer blood draw

## 2023-09-13 NOTE — ED Notes (Signed)
 Lab was contacted refer repeat trop

## 2023-09-14 ENCOUNTER — Emergency Department

## 2023-09-14 ENCOUNTER — Inpatient Hospital Stay
Admission: EM | Admit: 2023-09-14 | Discharge: 2023-09-18 | DRG: 312 | Disposition: A | Discharge status: Skilled Nursing Facility | Attending: Internal Medicine | Admitting: Internal Medicine

## 2023-09-14 ENCOUNTER — Other Ambulatory Visit: Payer: Self-pay

## 2023-09-14 DIAGNOSIS — N179 Acute kidney failure, unspecified: Secondary | ICD-10-CM | POA: Diagnosis present

## 2023-09-14 DIAGNOSIS — I129 Hypertensive chronic kidney disease with stage 1 through stage 4 chronic kidney disease, or unspecified chronic kidney disease: Secondary | ICD-10-CM | POA: Diagnosis present

## 2023-09-14 DIAGNOSIS — G35 Multiple sclerosis: Secondary | ICD-10-CM | POA: Diagnosis present

## 2023-09-14 DIAGNOSIS — R531 Weakness: Principal | ICD-10-CM

## 2023-09-14 DIAGNOSIS — Z91199 Patient's noncompliance with other medical treatment and regimen due to unspecified reason: Secondary | ICD-10-CM

## 2023-09-14 DIAGNOSIS — D638 Anemia in other chronic diseases classified elsewhere: Secondary | ICD-10-CM | POA: Diagnosis present

## 2023-09-14 DIAGNOSIS — Z8249 Family history of ischemic heart disease and other diseases of the circulatory system: Secondary | ICD-10-CM

## 2023-09-14 DIAGNOSIS — F1410 Cocaine abuse, uncomplicated: Secondary | ICD-10-CM | POA: Diagnosis present

## 2023-09-14 DIAGNOSIS — E860 Dehydration: Secondary | ICD-10-CM | POA: Diagnosis present

## 2023-09-14 DIAGNOSIS — F1910 Other psychoactive substance abuse, uncomplicated: Secondary | ICD-10-CM | POA: Diagnosis present

## 2023-09-14 DIAGNOSIS — Z880 Allergy status to penicillin: Secondary | ICD-10-CM

## 2023-09-14 DIAGNOSIS — I951 Orthostatic hypotension: Principal | ICD-10-CM | POA: Diagnosis present

## 2023-09-14 DIAGNOSIS — Z79899 Other long term (current) drug therapy: Secondary | ICD-10-CM

## 2023-09-14 DIAGNOSIS — C9000 Multiple myeloma not having achieved remission: Secondary | ICD-10-CM | POA: Diagnosis present

## 2023-09-14 DIAGNOSIS — G40909 Epilepsy, unspecified, not intractable, without status epilepticus: Secondary | ICD-10-CM | POA: Diagnosis present

## 2023-09-14 DIAGNOSIS — Z794 Long term (current) use of insulin: Secondary | ICD-10-CM

## 2023-09-14 DIAGNOSIS — R296 Repeated falls: Secondary | ICD-10-CM | POA: Diagnosis present

## 2023-09-14 DIAGNOSIS — D509 Iron deficiency anemia, unspecified: Secondary | ICD-10-CM | POA: Diagnosis present

## 2023-09-14 DIAGNOSIS — M79602 Pain in left arm: Secondary | ICD-10-CM | POA: Diagnosis present

## 2023-09-14 DIAGNOSIS — R55 Syncope and collapse: Secondary | ICD-10-CM | POA: Diagnosis present

## 2023-09-14 DIAGNOSIS — I480 Paroxysmal atrial fibrillation: Secondary | ICD-10-CM | POA: Diagnosis present

## 2023-09-14 DIAGNOSIS — E1040 Type 1 diabetes mellitus with diabetic neuropathy, unspecified: Secondary | ICD-10-CM | POA: Diagnosis present

## 2023-09-14 DIAGNOSIS — E785 Hyperlipidemia, unspecified: Secondary | ICD-10-CM | POA: Diagnosis present

## 2023-09-14 DIAGNOSIS — E7800 Pure hypercholesterolemia, unspecified: Secondary | ICD-10-CM | POA: Diagnosis present

## 2023-09-14 DIAGNOSIS — Y92000 Kitchen of unspecified non-institutional (private) residence as  the place of occurrence of the external cause: Secondary | ICD-10-CM

## 2023-09-14 DIAGNOSIS — S065XAA Traumatic subdural hemorrhage with loss of consciousness status unknown, initial encounter: Secondary | ICD-10-CM | POA: Diagnosis present

## 2023-09-14 DIAGNOSIS — N400 Enlarged prostate without lower urinary tract symptoms: Secondary | ICD-10-CM | POA: Diagnosis present

## 2023-09-14 DIAGNOSIS — Z9641 Presence of insulin pump (external) (internal): Secondary | ICD-10-CM | POA: Diagnosis present

## 2023-09-14 DIAGNOSIS — N189 Chronic kidney disease, unspecified: Secondary | ICD-10-CM | POA: Diagnosis present

## 2023-09-14 DIAGNOSIS — W19XXXA Unspecified fall, initial encounter: Secondary | ICD-10-CM | POA: Diagnosis present

## 2023-09-14 DIAGNOSIS — R710 Precipitous drop in hematocrit: Secondary | ICD-10-CM

## 2023-09-14 DIAGNOSIS — Z89422 Acquired absence of other left toe(s): Secondary | ICD-10-CM

## 2023-09-14 DIAGNOSIS — N1831 Chronic kidney disease, stage 3a: Secondary | ICD-10-CM | POA: Diagnosis present

## 2023-09-14 DIAGNOSIS — D631 Anemia in chronic kidney disease: Secondary | ICD-10-CM | POA: Diagnosis present

## 2023-09-14 DIAGNOSIS — I5A Non-ischemic myocardial injury (non-traumatic): Secondary | ICD-10-CM | POA: Diagnosis present

## 2023-09-14 DIAGNOSIS — Z7901 Long term (current) use of anticoagulants: Secondary | ICD-10-CM

## 2023-09-14 DIAGNOSIS — R011 Cardiac murmur, unspecified: Secondary | ICD-10-CM | POA: Diagnosis present

## 2023-09-14 DIAGNOSIS — S42302A Unspecified fracture of shaft of humerus, left arm, initial encounter for closed fracture: Secondary | ICD-10-CM | POA: Diagnosis present

## 2023-09-14 DIAGNOSIS — Z8673 Personal history of transient ischemic attack (TIA), and cerebral infarction without residual deficits: Secondary | ICD-10-CM

## 2023-09-14 DIAGNOSIS — E1022 Type 1 diabetes mellitus with diabetic chronic kidney disease: Secondary | ICD-10-CM | POA: Diagnosis present

## 2023-09-14 DIAGNOSIS — Z7952 Long term (current) use of systemic steroids: Secondary | ICD-10-CM

## 2023-09-14 LAB — COMPREHENSIVE METABOLIC PANEL WITH GFR
ALT: 24 U/L (ref 0–44)
AST: 17 U/L (ref 15–41)
Albumin: 3 g/dL — ABNORMAL LOW (ref 3.5–5.0)
Alkaline Phosphatase: 64 U/L (ref 38–126)
Anion gap: 12 (ref 5–15)
BUN: 39 mg/dL — ABNORMAL HIGH (ref 8–23)
CO2: 21 mmol/L — ABNORMAL LOW (ref 22–32)
Calcium: 8.6 mg/dL — ABNORMAL LOW (ref 8.9–10.3)
Chloride: 106 mmol/L (ref 98–111)
Creatinine, Ser: 1.73 mg/dL — ABNORMAL HIGH (ref 0.61–1.24)
GFR, Estimated: 42 mL/min — ABNORMAL LOW (ref >60)
Glucose, Bld: 213 mg/dL — ABNORMAL HIGH (ref 70–99)
Potassium: 4.1 mmol/L (ref 3.5–5.1)
Sodium: 139 mmol/L (ref 135–145)
Total Bilirubin: 0.9 mg/dL (ref 0.0–1.2)
Total Protein: 7.4 g/dL (ref 6.5–8.1)

## 2023-09-14 LAB — CBC
HCT: 27.9 % — ABNORMAL LOW (ref 39.0–52.0)
Hemoglobin: 8.8 g/dL — ABNORMAL LOW (ref 13.0–17.0)
MCH: 30.2 pg (ref 26.0–34.0)
MCHC: 31.5 g/dL (ref 30.0–36.0)
MCV: 95.9 fL (ref 80.0–100.0)
Platelets: 299 10*3/uL (ref 150–400)
RBC: 2.91 MIL/uL — ABNORMAL LOW (ref 4.22–5.81)
RDW: 17.5 % — ABNORMAL HIGH (ref 11.5–15.5)
WBC: 4 10*3/uL (ref 4.0–10.5)
nRBC: 0 % (ref 0.0–0.2)

## 2023-09-14 LAB — TROPONIN I (HIGH SENSITIVITY): Troponin I (High Sensitivity): 20 ng/L — ABNORMAL HIGH (ref <18)

## 2023-09-14 LAB — CBG MONITORING, ED: Glucose-Capillary: 203 mg/dL — ABNORMAL HIGH (ref 70–99)

## 2023-09-14 MED ORDER — SODIUM CHLORIDE 0.9 % IV BOLUS
500.0000 mL | Freq: Once | INTRAVENOUS | Status: AC
  Administered 2023-09-14: 500 mL via INTRAVENOUS

## 2023-09-14 NOTE — ED Triage Notes (Signed)
 Pt to ED via [facility]. Pt was going to [facility] for follow up visit when staff brought him to ED due to confusion and diaphoresis. On arrival pt alert and diaphoretic. Pt A&Ox3. Pt with partner who states pt has been more confused today with decreased appetite. Pt hx of DM. CBG 203 on arrival.

## 2023-09-14 NOTE — ED Provider Notes (Signed)
 [facility] Provider Note    Event Date/Time   First MD Initiated Contact with Patient 09/14/23 1848     (approximate)   History   Weakness   HPI  COSBY PROBY is a 73 y.o. male  who was sent to the emergency department from outpatient doctors appointment because of concern for weakness, confusion and diaphoresis. Patient himself states that he thinks he passed out when he was in the kitchen. Denies any chest pain or palpitations when it happened. Family states that he has been confused for a long time but they think it was worse today. The patient denies hitting his head but family says he fell out of his bed and they think he might have hit his head. No fevers or chills.     Physical Exam   Triage Vital Signs: ED Triage Vitals [09/14/23 1643]  Encounter Vitals Group     BP (!) 100/59     Girls Systolic BP Percentile      Girls Diastolic BP Percentile      Boys Systolic BP Percentile      Boys Diastolic BP Percentile      Pulse Rate 82     Resp 15     Temp 98.1 F (36.7 C)     Temp Source Oral     SpO2 95 %     Weight 145 lb 11.2 oz (66.1 kg)     Height 5' 10 (1.778 m)     Head Circumference      Peak Flow      Pain Score 0     Pain Loc      Pain Education      Exclude from Growth Chart     Most recent vital signs: Vitals:   09/14/23 1643  BP: (!) 100/59  Pulse: 82  Resp: 15  Temp: 98.1 F (36.7 C)  SpO2: 95%   General: Awake, alert, oriented. CV:  Good peripheral perfusion. Regular rate and rhythm. Resp:  Normal effort. Lungs clear. Abd:  No distention.    ED Results / Procedures / Treatments   Labs (all labs ordered are listed, but only abnormal results are displayed) Labs Reviewed  COMPREHENSIVE METABOLIC PANEL WITH GFR - Abnormal; Notable for the following components:      Result Value   CO2 21 (*)    Glucose, Bld 213 (*)    BUN 39 (*)    Creatinine, Ser 1.73 (*)    Calcium  8.6 (*)    Albumin 3.0 (*)    GFR,  Estimated 42 (*)    All other components within normal limits  CBC - Abnormal; Notable for the following components:   RBC 2.91 (*)    Hemoglobin 8.8 (*)    HCT 27.9 (*)    RDW 17.5 (*)    All other components within normal limits  CBG MONITORING, ED - Abnormal; Notable for the following components:   Glucose-Capillary 203 (*)    All other components within normal limits  URINALYSIS, ROUTINE W REFLEX MICROSCOPIC  CBG MONITORING, ED     EKG  I, Marylynn Soho, attending physician, personally viewed and interpreted this EKG  EKG Time: 1639 Rate: 65 Rhythm: atrial flutter Axis: normal Intervals: qtc 476 QRS: narrow ST changes: no st elevation Impression: abnormal ekg   RADIOLOGY I independently interpreted and visualized the CT head. My interpretation: No ICH Radiology interpretation:  IMPRESSION:  1. No acute intracranial abnormality.  2. Stable trace low-density  chronic left subdural hematoma, without mass effect  or midline shift.  3. Atrophy with small vessel ischemic changes.     PROCEDURES:  Critical Care performed: No  MEDICATIONS ORDERED IN ED: Medications  sodium chloride  0.9 % bolus 500 mL (has no administration in time range)     IMPRESSION / MDM / ASSESSMENT AND PLAN / ED COURSE  I reviewed the triage vital signs and the nursing notes.                              Differential diagnosis includes, but is not limited to, dehydration, anemia, electrolyte abnormality, arrhythmia  Patient's presentation is most consistent with acute presentation with potential threat to life or bodily function.   Patient presented to the emergency department today because of concerns for weakness confusion diaphoresis.  Patient states that he did pass out earlier today.  Patient has had multiple ER visits for similar complaints recently.  Blood work here is consistent with slight dehydration with elevation of creatinine.  Patient was given IV fluids here.  Discussed  with Dr. Rosalea Collin with the hospitalist service who will evaluate for admission.     FINAL CLINICAL IMPRESSION(S) / ED DIAGNOSES   Final diagnoses:  Weakness  Dehydration      Note:  This document was prepared using Dragon voice recognition software and may include unintentional dictation errors.    Marylynn Soho, MD 09/15/23 [phone]

## 2023-09-14 NOTE — ED Notes (Signed)
 Pt unable to void at this time.  Pt waiting on admission

## 2023-09-14 NOTE — ED Notes (Signed)
 Report off to taylor rn cpod nurse.   Pt moved to room 35.

## 2023-09-15 ENCOUNTER — Observation Stay

## 2023-09-15 DIAGNOSIS — W19XXXD Unspecified fall, subsequent encounter: Secondary | ICD-10-CM | POA: Diagnosis not present

## 2023-09-15 DIAGNOSIS — S065XAA Traumatic subdural hemorrhage with loss of consciousness status unknown, initial encounter: Secondary | ICD-10-CM | POA: Diagnosis not present

## 2023-09-15 DIAGNOSIS — I5A Non-ischemic myocardial injury (non-traumatic): Secondary | ICD-10-CM | POA: Diagnosis present

## 2023-09-15 DIAGNOSIS — F1410 Cocaine abuse, uncomplicated: Secondary | ICD-10-CM

## 2023-09-15 DIAGNOSIS — E785 Hyperlipidemia, unspecified: Secondary | ICD-10-CM

## 2023-09-15 DIAGNOSIS — W19XXXA Unspecified fall, initial encounter: Secondary | ICD-10-CM | POA: Diagnosis present

## 2023-09-15 DIAGNOSIS — I480 Paroxysmal atrial fibrillation: Secondary | ICD-10-CM

## 2023-09-15 DIAGNOSIS — F1910 Other psychoactive substance abuse, uncomplicated: Secondary | ICD-10-CM

## 2023-09-15 DIAGNOSIS — D509 Iron deficiency anemia, unspecified: Secondary | ICD-10-CM

## 2023-09-15 DIAGNOSIS — N1831 Chronic kidney disease, stage 3a: Secondary | ICD-10-CM

## 2023-09-15 DIAGNOSIS — N179 Acute kidney failure, unspecified: Secondary | ICD-10-CM

## 2023-09-15 DIAGNOSIS — R55 Syncope and collapse: Secondary | ICD-10-CM | POA: Diagnosis not present

## 2023-09-15 DIAGNOSIS — M79602 Pain in left arm: Secondary | ICD-10-CM

## 2023-09-15 DIAGNOSIS — G40909 Epilepsy, unspecified, not intractable, without status epilepticus: Secondary | ICD-10-CM

## 2023-09-15 DIAGNOSIS — Z8673 Personal history of transient ischemic attack (TIA), and cerebral infarction without residual deficits: Secondary | ICD-10-CM

## 2023-09-15 DIAGNOSIS — E1040 Type 1 diabetes mellitus with diabetic neuropathy, unspecified: Secondary | ICD-10-CM

## 2023-09-15 LAB — GLUCOSE, CAPILLARY
Glucose-Capillary: 291 mg/dL — ABNORMAL HIGH (ref 70–99)
Glucose-Capillary: 280 mg/dL — ABNORMAL HIGH (ref 70–99)
Glucose-Capillary: 103 mg/dL — ABNORMAL HIGH (ref 70–99)
Glucose-Capillary: 110 mg/dL — ABNORMAL HIGH (ref 70–99)

## 2023-09-15 LAB — CBC
HCT: 25.5 % — ABNORMAL LOW (ref 39.0–52.0)
Hemoglobin: 8.1 g/dL — ABNORMAL LOW (ref 13.0–17.0)
MCH: 30.1 pg (ref 26.0–34.0)
MCHC: 31.8 g/dL (ref 30.0–36.0)
MCV: 94.8 fL (ref 80.0–100.0)
Platelets: 288 10*3/uL (ref 150–400)
RBC: 2.69 MIL/uL — ABNORMAL LOW (ref 4.22–5.81)
RDW: 17.2 % — ABNORMAL HIGH (ref 11.5–15.5)
WBC: 4 10*3/uL (ref 4.0–10.5)
nRBC: 0.8 % — ABNORMAL HIGH (ref 0.0–0.2)

## 2023-09-15 LAB — BASIC METABOLIC PANEL WITH GFR
Anion gap: 11 (ref 5–15)
BUN: 42 mg/dL — ABNORMAL HIGH (ref 8–23)
CO2: 21 mmol/L — ABNORMAL LOW (ref 22–32)
Calcium: 8 mg/dL — ABNORMAL LOW (ref 8.9–10.3)
Chloride: 106 mmol/L (ref 98–111)
Creatinine, Ser: 1.64 mg/dL — ABNORMAL HIGH (ref 0.61–1.24)
GFR, Estimated: 45 mL/min — ABNORMAL LOW (ref >60)
Glucose, Bld: 291 mg/dL — ABNORMAL HIGH (ref 70–99)
Potassium: 4.9 mmol/L (ref 3.5–5.1)
Sodium: 138 mmol/L (ref 135–145)

## 2023-09-15 LAB — CBG MONITORING, ED: Glucose-Capillary: 86 mg/dL (ref 70–99)

## 2023-09-15 MED ORDER — INSULIN ASPART 100 UNIT/ML IJ SOLN: 0.0000 [IU] | Freq: Every day | INTRAMUSCULAR | Status: DC

## 2023-09-15 MED ORDER — INSULIN ASPART 100 UNIT/ML IJ SOLN
0.0000 [IU] | Freq: Three times a day (TID) | INTRAMUSCULAR | Status: DC
  Administered 2023-09-15 (×2): 5 [IU] via SUBCUTANEOUS
  Administered 2023-09-17: 2 [IU] via SUBCUTANEOUS
  Administered 2023-09-18: 1 [IU] via SUBCUTANEOUS
  Filled 2023-09-15 (×6): qty 1

## 2023-09-15 MED ORDER — INSULIN ASPART 100 UNIT/ML IJ SOLN: 0.0000 [IU] | Freq: Three times a day (TID) | INTRAMUSCULAR | Status: DC

## 2023-09-15 MED ORDER — INSULIN ASPART 100 UNIT/ML IJ SOLN
0.0000 [IU] | Freq: Every day | INTRAMUSCULAR | Status: DC
  Filled 2023-09-15: qty 1

## 2023-09-15 MED ORDER — INSULIN PUMP
Freq: Three times a day (TID) | SUBCUTANEOUS | Status: DC
  Filled 2023-09-15: qty 1

## 2023-09-15 MED ORDER — OXYCODONE-ACETAMINOPHEN 5-325 MG PO TABS
1.0000 | ORAL_TABLET | ORAL | Status: DC | PRN
  Administered 2023-09-17 (×2): 1 via ORAL
  Filled 2023-09-15 (×4): qty 1

## 2023-09-15 MED ORDER — ONDANSETRON HCL 4 MG/2ML IJ SOLN: 4.0000 mg | Freq: Three times a day (TID) | INTRAMUSCULAR | Status: DC | PRN

## 2023-09-15 MED ORDER — LORAZEPAM 2 MG/ML IJ SOLN: 2.0000 mg | INTRAMUSCULAR | Status: DC | PRN

## 2023-09-15 MED ORDER — FLUDROCORTISONE ACETATE 0.1 MG PO TABS: 0.1000 mg | ORAL_TABLET | Freq: Every day | ORAL | Status: DC | PRN

## 2023-09-15 MED ORDER — PANTOPRAZOLE SODIUM 40 MG PO TBEC: 40.0000 mg | DELAYED_RELEASE_TABLET | Freq: Every day | ORAL | Status: DC

## 2023-09-15 MED ORDER — SODIUM CHLORIDE 0.9 % IV BOLUS
500.0000 mL | Freq: Once | INTRAVENOUS | Status: AC
  Administered 2023-09-15: 500 mL via INTRAVENOUS

## 2023-09-15 MED ORDER — ATORVASTATIN CALCIUM 20 MG PO TABS
80.0000 mg | ORAL_TABLET | Freq: Every day | ORAL | Status: DC
  Administered 2023-09-15 – 2023-09-17 (×3): 80 mg via ORAL
  Filled 2023-09-15 (×4): qty 4

## 2023-09-15 MED ORDER — ACYCLOVIR 200 MG PO CAPS
400.0000 mg | ORAL_CAPSULE | Freq: Two times a day (BID) | ORAL | Status: DC
  Administered 2023-09-15 – 2023-09-18 (×6): 400 mg via ORAL
  Filled 2023-09-15 (×8): qty 2

## 2023-09-15 MED ORDER — LEVETIRACETAM 500 MG PO TABS
500.0000 mg | ORAL_TABLET | Freq: Two times a day (BID) | ORAL | Status: DC
  Administered 2023-09-15 – 2023-09-18 (×6): 500 mg via ORAL
  Filled 2023-09-15 (×8): qty 1

## 2023-09-15 MED ORDER — MIDODRINE HCL 5 MG PO TABS
10.0000 mg | ORAL_TABLET | Freq: Three times a day (TID) | ORAL | Status: DC
  Filled 2023-09-15 (×2): qty 2

## 2023-09-15 MED ORDER — ACETAMINOPHEN 325 MG PO TABS: 650.0000 mg | ORAL_TABLET | Freq: Four times a day (QID) | ORAL | Status: DC | PRN

## 2023-09-15 MED ORDER — MIDODRINE HCL 5 MG PO TABS: 2.5000 mg | ORAL_TABLET | Freq: Three times a day (TID) | ORAL | Status: DC

## 2023-09-15 MED ORDER — FLUDROCORTISONE ACETATE 0.1 MG PO TABS
0.1000 mg | ORAL_TABLET | Freq: Every day | ORAL | Status: DC
  Filled 2023-09-15 (×2): qty 1

## 2023-09-15 MED ORDER — POLYSACCHARIDE IRON COMPLEX 150 MG PO CAPS
150.0000 mg | ORAL_CAPSULE | Freq: Every day | ORAL | Status: DC
  Administered 2023-09-15 – 2023-09-18 (×3): 150 mg via ORAL
  Filled 2023-09-15 (×4): qty 1

## 2023-09-15 MED ORDER — INSULIN GLARGINE-YFGN 100 UNIT/ML ~~LOC~~ SOLN
4.0000 [IU] | Freq: Every day | SUBCUTANEOUS | Status: DC
  Administered 2023-09-15 – 2023-09-18 (×3): 4 [IU] via SUBCUTANEOUS
  Filled 2023-09-15 (×4): qty 0.04

## 2023-09-15 MED ORDER — MIDODRINE HCL 5 MG PO TABS
5.0000 mg | ORAL_TABLET | Freq: Three times a day (TID) | ORAL | Status: DC
  Administered 2023-09-15 (×3): 5 mg via ORAL
  Filled 2023-09-15 (×3): qty 1

## 2023-09-15 NOTE — Assessment & Plan Note (Signed)
 On Keppra

## 2023-09-15 NOTE — Evaluation (Signed)
 Occupational Therapy Evaluation Patient Details Name: Nicholas Weiss MRN: [id number] DOB: 1950-12-15 Today's Date: 09/15/2023   History of Present Illness   Nicholas Weiss is a 73 y.o. male with medical history significant of type 1 diabetes, DKA, cocaine abuse, orthostatic hypotension, hyperlipidemia, type 1 diabetes, stroke, BPH, CKD-3, PUD, anemia, multiple sclerosis, recurrent multiple falls, SDH, SAH, who presents with syncope and fall.     Clinical Impressions Patient presenting with decreased Ind in self care,balance, functional mobility/transfers,endurance, and safety awareness. Patient reports living at home with wife and being able to care for himself up until recent fx ~ 1 weeks ago. Pt has had multiple admissions and falls recently. B thigh high TEDs and abdominal binder donned and pt continues to be positive for orthostatic hypotension during session. Pt also with L humerus fx from previous admission and is NWB with increased swelling. Per MD, pt should be wearing sling but one not present in room. +2 assistance needed for bed mobility and standing attempt.  Side steps to the R with min A of 2 for safety awareness.  Patient will benefit from acute OT to increase overall independence in the areas of ADLs, functional mobility, and safety awareness in order to safely discharge.      If plan is discharge home, recommend the following:   Assistance with cooking/housework;Supervision due to cognitive status;Assist for transportation;A lot of help with walking and/or transfers;A lot of help with bathing/dressing/bathroom;Help with stairs or ramp for entrance     Functional Status Assessment   Patient has had a recent decline in their functional status and demonstrates the ability to make significant improvements in function in a reasonable and predictable amount of time.     Equipment Recommendations   Other (comment) (defer to next venue of care)       Precautions/Restrictions   Precautions Precautions: Fall Recall of Precautions/Restrictions: Impaired Precaution/Restrictions Comments: Orthostatic hypotension. Ted hose/abd binder Restrictions Weight Bearing Restrictions Per Provider Order: Yes LUE Weight Bearing Per Provider Order: Non weight bearing Other Position/Activity Restrictions: Per MD, sling needed     Mobility Bed Mobility Overal bed mobility: Needs Assistance Bed Mobility: Supine to Sit, Sit to Supine     Supine to sit: +2 for physical assistance, Max assist Sit to supine: +2 for physical assistance, Max assist        Transfers Overall transfer level: Modified independent Equipment used: Rolling walker (2 wheels) Transfers: Sit to/from Stand Sit to Stand: Min assist, Mod assist, +2 physical assistance                  Balance Overall balance assessment: Needs assistance Sitting-balance support: Feet supported Sitting balance-Leahy Scale: Fair       Standing balance-Leahy Scale: Poor                             ADL either performed or assessed with clinical judgement   ADL Overall ADL's : Needs assistance/impaired                                       General ADL Comments: Pt needing heavy assistance for all self care and functional transfers at this time.     Vision Baseline Vision/History: 1 Wears glasses              Pertinent Vitals/Pain Pain Assessment Pain Assessment: 0-10 Pain  Score: 8  Pain Location: L shoulder Pain Descriptors / Indicators: Grimacing, Guarding     Extremity/Trunk Assessment Upper Extremity Assessment Upper Extremity Assessment: Right hand dominant;LUE deficits/detail (Simultaneous filing. User may not have seen previous data.) LUE Deficits / Details: very limited L shoulder mobility. Unsure of ROM deficits or pain limited. Relies on RUE to mobilize LUE. (Simultaneous filing. User may not have seen previous data.)   Lower  Extremity Assessment Lower Extremity Assessment: Generalized weakness   Cervical / Trunk Assessment Cervical / Trunk Assessment: Normal   Communication Communication Communication: No apparent difficulties   Cognition Arousal: Alert Behavior During Therapy: WFL for tasks assessed/performed Cognition: History of cognitive impairments                               Following commands: Impaired Following commands impaired: Follows one step commands inconsistently     Cueing  General Comments   Cueing Techniques: Verbal cues;Tactile cues              Home Living Family/patient expects to be discharged to:: Private residence Living Arrangements: Spouse/significant other Available Help at Discharge: Family;Available 24 hours/day Type of Home: House Home Access: Stairs to enter [employer] of Steps: 1 Entrance Stairs-Rails: Can reach both Home Layout: One level     Bathroom Shower/Tub: Producer, television/film/video: Standard     Home Equipment: Cane - quad;Shower seat;Grab bars - toilet;Grab bars - tub/shower;Cane - single Librarian, academic (2 wheels)   Additional Comments: uses SPC in RUE for gait.      Prior Functioning/Environment Prior Level of Function : Needs assist;History of Falls (last six months)             Mobility Comments: pt reports he walks with his cane ADLs Comments: Per spouse, since L humerus fx has been reliant on her for all ADL's and pt having multiple falls weekly    OT Problem List: Decreased activity tolerance;Impaired balance (sitting and/or standing)   OT Treatment/Interventions: Self-care/ADL training;Energy conservation;Patient/family education;Therapeutic exercise;Therapeutic activities;DME and/or AE instruction;Balance training      OT Goals(Current goals can be found in the care plan section)   Acute Rehab OT Goals Patient Stated Goal: to get stronger and decrease pain OT Goal Formulation:  With patient Time For Goal Achievement: 09/29/23 Potential to Achieve Goals: Fair ADL Goals Pt Will Perform Grooming: with supervision;standing Pt Will Perform Upper Body Dressing: with min assist;sitting Pt Will Transfer to Toilet: with supervision;ambulating Pt Will Perform Toileting - Clothing Manipulation and hygiene: with supervision;sit to/from stand   OT Frequency:  Min 2X/week    Co-evaluation PT/OT/SLP Co-Evaluation/Treatment: Yes Reason for Co-Treatment: Complexity of the patient's impairments (multi-system involvement);For patient/therapist safety;To address functional/ADL transfers PT goals addressed during session: Mobility/safety with mobility OT goals addressed during session: ADL's and self-care      AM-PAC OT 6 Clicks Daily Activity     Outcome Measure Help from another person eating meals?: None Help from another person taking care of personal grooming?: A Little Help from another person toileting, which includes using toliet, bedpan, or urinal?: A Lot Help from another person bathing (including washing, rinsing, drying)?: A Lot Help from another person to put on and taking off regular upper body clothing?: A Lot Help from another person to put on and taking off regular lower body clothing?: Total 6 Click Score: 14   End of Session Equipment Utilized During Treatment: Gait belt Nurse Communication:  Mobility status  Activity Tolerance: Patient limited by pain;Patient limited by fatigue Patient left: with call bell/phone within reach;in bed;with bed alarm set;with family/visitor present  OT Visit Diagnosis: Unsteadiness on feet (R26.81);Other abnormalities of gait and mobility (R26.89)                Time: 1344-1410 OT Time Calculation (min): 26 min Charges:  OT General Charges $OT Visit: 1 Visit OT Evaluation $OT Eval Moderate Complexity: 1 [address], [place], OTR/L , CBIS ascom [phone]  09/15/23, 4:01 PM

## 2023-09-15 NOTE — Assessment & Plan Note (Signed)
 On Lipitor

## 2023-09-15 NOTE — Assessment & Plan Note (Signed)
 Semglee  insulin  and sliding scale insulin  ordered.  Patient uses insulin  pump at home.

## 2023-09-15 NOTE — Assessment & Plan Note (Signed)
Secondary to orthostatic hypotension. 

## 2023-09-15 NOTE — Inpatient Diabetes Management (Signed)
 Inpatient Diabetes Program Recommendations  AACE/ADA: New Consensus Statement on Inpatient Glycemic Control (2015)  Target Ranges:  Prepandial:   less than 140 mg/dL      Peak postprandial:   less than 180 mg/dL (1-2 hours)      Critically ill patients:  140 - 180 mg/dL    Latest Reference Range & Units 07/21/23 16:40  Hemoglobin A1C 4.8 - 5.6 % 10.0 (H)  240 mg/dl  (H): Data is abnormally high  Latest Reference Range & Units 09/14/23 16:35 09/15/23 01:21 09/15/23 07:36  Glucose-Capillary 70 - 99 mg/dL 161 (H) 86 096 (H)  5 units Novolog   4 units Semglee  @0949   (H): Data is abnormally high   Admit with:  Syncope and fall Possibly due to orthostatic status  Patient was recently hospitalized from 5/30 - 6/3 due to fall which resulted in SDH Patient was started as needed midodrine  and Florinef  Patient has had four ED visits recently due to multiple falls  History: Type 1 Diabetes, DKA, Cocaine Abuse, CKD3, SDH  Home DM Meds: Insulin  Pump       Dexcom G7 CGM       Humalog in the Insulin  Pump  Current Orders: Insulin  Pump   Note pt's Insulin  Pump removed around 2am per ED notes Secure Chat to MD this AM requesting Semglee  and Novolog --Orders placed Semglee  started at 10am   ENDO: Dr. Lorelei Rogers with [facility] Wife manages pt's Insulin  Pump at home He was trained to start a Tandem T:slim X2 insulin  pump on 07/02/2023 He is currently using the Tandem pump and a DexCom G7 CGM Pump settings: Basal 0.25 units/hr  Correction 1:60  Carb ratio 1:20  Target blood glucose 120 24-hr basal is 6 units   When not on pump: Take Tresiba  6 units at bedtime Lispro/Humalog/NovoLog : before breakfast -- take 1 unit, before lunch -- take 2 units, before supper -- take 4 units + if sugar is over 150 before the meal, you can ADD 1 unit    --Will follow patient during hospitalization--  Langston Pippins RN, MSN, CDCES Diabetes Coordinator Inpatient Glycemic Control Team Team Pager:  [phone] (8a-5p)

## 2023-09-15 NOTE — H&P (Signed)
 History and Physical    Nicholas Weiss ZOX:[id number] DOB: 12-23-1950 DOA: 09/14/2023  Referring MD/NP/PA:   PCP: Monique Ano, MD   Patient coming from:  The patient is coming from home.     Chief Complaint: Syncope and fall  HPI: Nicholas Weiss is a 73 y.o. male with medical history significant of type 1 diabetes, DKA, cocaine abuse, orthostatic hypotension, hyperlipidemia, type 1 diabetes, stroke, BPH, CKD-3, PUD, anemia, multiple sclerosis, recurrent multiple falls, SDH, SAH, who presents with syncope and fall.  Patient was recently hospitalized from 5/30 - 6/3 due to fall which resulted in SDH.  He is Eliquis  is on hold.  Possibly due to orthostatic hypotension.  Patient was started as needed midodrine  and Florinef .  Patient has had four ED visits recently due to multiple falls.  He states that he fainted and fell in his kitchen earlier today.  Denies unilateral numbness or tingling in extremities.  No facial droop or slurred speech.  No new head injury.  He complains of left arm pain around elbow area, which is constant, severe, sharp, nonradiating, aggravated by movement.  Left arm is swollen, no erythema.  Denies chest pain, cough, SOB.  No nausea, vomiting, diarrhea or abdominal pain.  No symptoms of UTI.  He reports lightheadedness and dizziness intermittently.   Data reviewed independently and ED Course: pt was found to have WBC 4.0, stable hemoglobin, worsening renal function, temperature normal, blood pressure 100/59, heart rate 82, 15, oxygen saturation 95% on room air.  Patient is previously in telemetry bed position.  CT of head: 1. No acute intracranial abnormality. 2. Stable trace low-density chronic left subdural hematoma, without mass effect or midline shift. 3. Atrophy with small vessel ischemic changes.  EKG: I have personally reviewed. A fib, QTc 476, nonspecific T wave change.   Review of Systems:   General: no fevers, chills, no body weight gain, has  fatigue HEENT: no blurry vision, hearing changes or sore throat Respiratory: no dyspnea, coughing, wheezing CV: no chest pain, no palpitations GI: no nausea, vomiting, abdominal pain, diarrhea, constipation GU: no dysuria, burning on urination, increased urinary frequency, hematuria  Ext: no leg edema Neuro: no unilateral weakness, numbness, or tingling, no vision change or hearing loss.  Has fall, lightheadedness Skin: no rash, no skin tear. MSK: Has left arm pain and swelling Heme: No easy bruising.  Travel history: No recent long distant travel.   Allergy:  Allergies  Allergen Reactions   Penicillins Other (See Comments)    Childhood allergy -tolerated amoxil  03/2022 Unknown reaction Has patient had a PCN reaction causing immediate rash, facial/tongue/throat swelling, SOB or lightheadedness with hypotension: No Has patient had a PCN reaction causing severe rash involving mucus membranes or skin necrosis: No Has patient had a PCN reaction that required hospitalization No Has patient had a PCN reaction occurring within the last 10 years: No If all of the above answers are NO, then may proceed with Cephalosporin use.     Past Medical History:  Diagnosis Date   Acute metabolic encephalopathy 12/02/2020   DKA (diabetic ketoacidoses) 04/06/2016   Hypercholesteremia    Hypertension     Past Surgical History:  Procedure Laterality Date   AMPUTATION TOE Left 04/25/2022   Procedure: LEFT GREAT TOE AMPUTATION AND LEFT PARTIAL 2ND TOE AMPUTATION;  Surgeon: Dot Gazella, DPM;  Location: [facility] ORS;  Service: Podiatry;  Laterality: Left;   COLONOSCOPY WITH PROPOFOL  N/A 02/01/2020   Procedure: COLONOSCOPY WITH PROPOFOL ;  Surgeon: Ellis Guys  Kieran Pellet, MD;  Location: [facility] ENDOSCOPY;  Service: Gastroenterology;  Laterality: N/A;   ESOPHAGOGASTRODUODENOSCOPY  02/01/2020   Procedure: ESOPHAGOGASTRODUODENOSCOPY (EGD);  Surgeon: Selena Daily, MD;  Location: [facility] ENDOSCOPY;  Service:  Gastroenterology;;   ESOPHAGOGASTRODUODENOSCOPY (EGD) WITH PROPOFOL  N/A 06/07/2022   Procedure: ESOPHAGOGASTRODUODENOSCOPY (EGD) WITH PROPOFOL ;  Surgeon: Genell Ken, MD;  Location: [facility] ENDOSCOPY;  Service: Gastroenterology;  Laterality: N/A;   KNEE SURGERY Right    Torn meniscus   KNEE SURGERY Left     Social History:  reports that he has never smoked. He has never used smokeless tobacco. He reports that he does not currently use alcohol. He reports current drug use. Drugs: Marijuana and Crack cocaine.  Family History:  Family History  Problem Relation Age of Onset   Heart attack Father    Hypertension Sister    Cancer Sister      Prior to Admission medications   Medication Sig Start Date End Date Taking? Authorizing Provider  acyclovir  (ZOVIRAX ) 400 MG tablet Take 1 tablet (400 mg total) by mouth 2 (two) times daily. 08/13/23 11/11/23  Avonne Boettcher, MD  apixaban  (ELIQUIS ) 5 MG TABS tablet Take 1 tablet (5 mg total) by mouth 2 (two) times daily. Start Eliquis  after 2 weeks which will be 03/02/2023 03/02/23   Althia Atlas, MD  ascorbic acid  (VITAMIN C ) 500 MG tablet Take 1 tablet (500 mg total) by mouth daily. 04/09/22   Amin, Sumayya, MD  atorvastatin  (LIPITOR ) 80 MG tablet Take 1 tablet (80 mg total) by mouth at bedtime. Hold while taking Paxlovid  04/08/22   Luna Salinas, MD  bisacodyl  5 MG EC tablet Take 1 tablet (5 mg total) by mouth at bedtime. Skip the dose if no constipation 02/24/23   Althia Atlas, MD  Continuous Glucose Sensor (DEXCOM G7 SENSOR) MISC 1 each by Does not apply route 4 (four) times daily -  before meals and at bedtime. 04/10/23   Tiajuana Fluke, MD  fludrocortisone  (FLORINEF ) 0.1 MG tablet Take 1 tablet (0.1 mg total) by mouth daily. Skip the dose if SBP >130 02/25/23   Althia Atlas, MD  gabapentin  (NEURONTIN ) 300 MG capsule Take 300 mg by mouth 2 (two) times daily. 03/03/23   [provider]  Insulin  Aspart FlexPen (NOVOLOG ) 100 UNIT/ML Sliding Scale:  Glucose 70 - 120: 0 units Glucose 121 - 150: 1 unit Glucose 151 - 200: 2 units Glucose 201 - 250: 3 units Glucose 251 - 300: 5 units Glucose 301 - 350: 7 units Glucose 351 - 400: 9 units Glucose > 400: call your docotr 03/03/23   [provider]  insulin  degludec (TRESIBA  FLEXTOUCH) 100 UNIT/ML FlexTouch Pen Inject 5 Units into the skin daily. Home med. 04/10/23   Tiajuana Fluke, MD  iron  polysaccharides (NIFEREX) 150 MG capsule Take 1 capsule (150 mg total) by mouth daily. 03/20/23 07/21/23  Althia Atlas, MD  levETIRAcetam  (KEPPRA ) 500 MG tablet Take 1 tablet by mouth twice daily 09/09/23   Vaslow, [name] K, MD  midodrine  (PROAMATINE ) 2.5 MG tablet Take 2.5 mg by mouth 3 (three) times daily.    [provider]  Multiple Vitamin (MULTIVITAMIN WITH MINERALS) TABS tablet Take 1 tablet by mouth daily. 04/09/22   Luna Salinas, MD  ondansetron  (ZOFRAN ) 8 MG tablet Take 8 mg by mouth daily as needed for nausea or vomiting.    [provider]  oxyCODONE -acetaminophen  (PERCOCET) 5-325 MG tablet Take 1 tablet by mouth every 4 (four) hours as needed for severe pain (pain  score 7-10). 09/09/23   Bradler, Evan K, MD  pantoprazole  (PROTONIX ) 40 MG tablet Take 40 mg by mouth daily. 04/11/23   [provider]  polyethylene glycol (MIRALAX ) 17 g packet Take 17 g by mouth 2 (two) times daily. Skip the dose if no constipation 02/24/23   Althia Atlas, MD  POMALYST  2 MG capsule TAKE 1 CAPSULE BY MOUTH EVERY DAY FOR 21 DAYS THEN HOLD FOR 7 DAYS. REPEAT EVERY 28 DAYS. 09/09/23   Avonne Boettcher, MD    Physical Exam: Vitals:   09/14/23 1643 09/14/23 2350 09/15/23 0015  BP: (!) 100/59 (!) 149/85   Pulse: 82 73 73  Resp: 15 17   Temp: 98.1 F (36.7 C)  (!) 97.4 F (36.3 C)  TempSrc: Oral  Oral  SpO2: 95% 100% 100%  Weight: 66.1 kg    Height: 5' 10 (1.778 m)     General: Not in acute distress HEENT:       Eyes: PERRL, EOMI, no jaundice       ENT: No discharge from the ears  and nose, no pharynx injection, no tonsillar enlargement.        Neck: No JVD, no bruit, no mass felt. Heme: No neck lymph node enlargement. Cardiac: S1/S2, RRR, No murmurs, No gallops or rubs. Respiratory: No rales, wheezing, rhonchi or rubs. GI: Soft, nondistended, nontender, no rebound pain, no organomegaly, BS present. GU: No hematuria Ext: No pitting leg edema bilaterally. 1+DP/PT pulse bilaterally. Musculoskeletal: Has swelling and tenderness in left arm around elbow area, has limited range of motion due to severe pain Skin: No rashes.  Neuro: Alert, oriented X3, cranial nerves II-XII grossly intact, moves all extremities normally.  Psych: Patient is not psychotic, no suicidal or hemocidal ideation.  Labs on Admission: I have personally reviewed following labs and imaging studies  CBC: Recent Labs  Lab 09/09/23 1506 09/10/23 0855 09/11/23 1930 09/13/23 1931 09/14/23 1759  WBC 3.5* 2.7* 3.5* 3.8* 4.0  NEUTROABS  --  1.5*  --  2.2  --   HGB 9.0* 8.3* 8.1* 8.4* 8.8*  HCT 28.7* 26.4* 25.1* 26.5* 27.9*  MCV 97.3 96.7 96.2 96.4 95.9  PLT 216 241 209 260 299   Basic Metabolic Panel: Recent Labs  Lab 09/09/23 1506 09/10/23 0855 09/11/23 1930 09/13/23 1931 09/14/23 1759  NA 138 138 136 141 139  K 4.5 4.3 4.0 4.3 4.1  CL 106 106 104 108 106  CO2 22 24 23 23  21*  GLUCOSE 340* 120* 361* 103* 213*  BUN 28* 32* 36* 27* 39*  CREATININE 1.43* 1.52* 1.54* 1.31* 1.73*  CALCIUM  8.3* 8.4* 8.6* 8.6* 8.6*   GFR: Estimated Creatinine Clearance: 37.1 mL/min (A) (by C-G formula based on SCr of 1.73 mg/dL (H)). Liver Function Tests: Recent Labs  Lab 09/09/23 1506 09/10/23 0855 09/11/23 1930 09/14/23 1759  AST 32 24 24 17   ALT 39 33 29 24  ALKPHOS 72 74 63 64  BILITOT 0.8 0.5 0.6 0.9  PROT 7.7 7.8 7.9 7.4  ALBUMIN 3.1* 3.4* 3.2* 3.0*   No results for input(s): LIPASE, AMYLASE in the last 168 hours. Recent Labs  Lab 09/11/23 1930  AMMONIA  14   Coagulation  Profile: No results for input(s): INR, PROTIME in the last 168 hours. Cardiac Enzymes: No results for input(s): CKTOTAL, CKMB, CKMBINDEX, TROPONINI in the last 168 hours. BNP (last 3 results) No results for input(s): PROBNP in the last 8760 hours. HbA1C: No results for input(s): HGBA1C in the last 72  hours. CBG: Recent Labs  Lab 09/09/23 1454 09/11/23 2312 09/11/23 2314 09/14/23 1635 09/15/23 0121  GLUCAP 305* 80 127* 203* 86   Lipid Profile: No results for input(s): CHOL, HDL, LDLCALC, TRIG, CHOLHDL, LDLDIRECT in the last 72 hours. Thyroid  Function Tests: No results for input(s): TSH, T4TOTAL, FREET4, T3FREE, THYROIDAB in the last 72 hours. Anemia Panel: No results for input(s): VITAMINB12, FOLATE, FERRITIN, TIBC, IRON , RETICCTPCT in the last 72 hours. Urine analysis:    Component Value Date/Time   COLORURINE STRAW (A) 09/11/2023 1938   APPEARANCEUR CLEAR (A) 09/11/2023 1938   LABSPEC 1.018 09/11/2023 1938   PHURINE 5.0 09/11/2023 1938   GLUCOSEU >=500 (A) 09/11/2023 1938   HGBUR NEGATIVE 09/11/2023 1938   BILIRUBINUR NEGATIVE 09/11/2023 1938   KETONESUR NEGATIVE 09/11/2023 1938   PROTEINUR NEGATIVE 09/11/2023 1938   NITRITE NEGATIVE 09/11/2023 1938   LEUKOCYTESUR NEGATIVE 09/11/2023 1938   Sepsis Labs: @LABRCNTIP (procalcitonin:4,lacticidven:4) ) Recent Results (from the past 240 hours)  Resp panel by RT-PCR (RSV, Flu A&B, Covid) Anterior Nasal Swab     Status: None   Collection Time: 09/11/23  7:37 PM   Specimen: Anterior Nasal Swab  Result Value Ref Range Status   SARS Coronavirus 2 by RT PCR NEGATIVE NEGATIVE Final    Comment: (NOTE) SARS-CoV-2 target nucleic acids are NOT DETECTED.  The SARS-CoV-2 RNA is generally detectable in upper respiratory specimens during the acute phase of infection. The lowest concentration of SARS-CoV-2 viral copies this assay can detect is 138 copies/mL. A negative result does not  preclude SARS-Cov-2 infection and should not be used as the sole basis for treatment or other patient management decisions. A negative result may occur with  improper specimen collection/handling, submission of specimen other than nasopharyngeal swab, presence of viral mutation(s) within the areas targeted by this assay, and inadequate number of viral copies(<138 copies/mL). A negative result must be combined with clinical observations, patient history, and epidemiological information. The expected result is Negative.  Fact Sheet for Patients:  BloggerCourse.com  Fact Sheet for Healthcare Providers:  SeriousBroker.it  This test is no t yet approved or cleared by the [place]  FDA and  has been authorized for detection and/or diagnosis of SARS-CoV-2 by FDA under an Emergency Use Authorization (EUA). This EUA will remain  in effect (meaning this test can be used) for the duration of the COVID-19 declaration under Section 564(b)(1) of the Act, 21 U.S.C.section 360bbb-3(b)(1), unless the authorization is terminated  or revoked sooner.       Influenza A by PCR NEGATIVE NEGATIVE Final   Influenza B by PCR NEGATIVE NEGATIVE Final    Comment: (NOTE) The Xpert Xpress SARS-CoV-2/FLU/RSV plus assay is intended as an aid in the diagnosis of influenza from Nasopharyngeal swab specimens and should not be used as a sole basis for treatment. Nasal washings and aspirates are unacceptable for Xpert Xpress SARS-CoV-2/FLU/RSV testing.  Fact Sheet for Patients: BloggerCourse.com  Fact Sheet for Healthcare Providers: SeriousBroker.it  This test is not yet approved or cleared by the [place]  FDA and has been authorized for detection and/or diagnosis of SARS-CoV-2 by FDA under an Emergency Use Authorization (EUA). This EUA will remain in effect (meaning this test can be used) for the  duration of the COVID-19 declaration under Section 564(b)(1) of the Act, 21 U.S.C. section 360bbb-3(b)(1), unless the authorization is terminated or revoked.     Resp Syncytial Virus by PCR NEGATIVE NEGATIVE Final    Comment: (NOTE) Fact Sheet for Patients: BloggerCourse.com  Fact Sheet  for Healthcare Providers: SeriousBroker.it  This test is not yet approved or cleared by the [place]  FDA and has been authorized for detection and/or diagnosis of SARS-CoV-2 by FDA under an Emergency Use Authorization (EUA). This EUA will remain in effect (meaning this test can be used) for the duration of the COVID-19 declaration under Section 564(b)(1) of the Act, 21 U.S.C. section 360bbb-3(b)(1), unless the authorization is terminated or revoked.  Performed at [facility], [address] [place], [place] [address]      Radiological Exams on Admission:   Assessment/Plan Principal Problem:   Syncope and collapse, recurrent Active Problems:   Fall   SDH (subdural hematoma) (HCC)   Left arm pain   Acute renal failure superimposed on stage 3a chronic kidney disease (HCC)   Type 1 diabetes mellitus with diabetic neuropathy, unspecified (HCC)   Dyslipidemia   Seizure disorder (HCC)   Paroxysmal atrial fibrillation (HCC)   History of CVA (cerebrovascular accident)   Myocardial injury   Iron  deficiency anemia   Multiple myeloma (HCC)   Cocaine abuse (HCC)   Polysubstance abuse (HCC)   Assessment and Plan:  Syncope and collapse, recurrent: Possibly due to orthostatic status.  Other differential diagnosis includes drug abuse and a seizure.  No focal neurodeficit on physical examination.  His subdural hematoma is stable by CT scan.  -Please stay in telemetry bed for observation - Frequent neurocheck - Fall precaution - Check orthostatic vital sign - continue midodrine  and Florinef  - IV fluid: Patient received 500 cc  normal saline x 2 in ED - PT/OT - Check UDS - Get EEG  Fall - PT/OT - Fall precaution  SDH (subdural hematoma) (HCC) -Stable  Left arm pain -As needed Percocet, Tylenol  for pain - Follow-up x-ray of left elbow and left forearm  Acute renal failure superimposed on stage 3a chronic kidney disease (HCC): Baseline creatinine 1.3-1.5.  His creatinine is 1.73, BUN 39, GFR 42, slightly worsening. -IV fluid as above  Type 1 diabetes mellitus with diabetic neuropathy, unspecified (HCC): Recent A1c 10.0.  Patient is on insulin  pump. -Continue insulin  pump  Dyslipidemia -lipitor   seizure disorder (HCC) - Seizure precaution - As needed Ativan  for seizure - Continue home Keppra  500 mg twice daily  Paroxysmal atrial fibrillation (HCC): Heart rate 80s. -Eliquis  is on hold  History of CVA (cerebrovascular accident) -Lipitor   Myocardial injury: Troponin 20.  Patient had troponin 21 --> 19 yesterday.  No chest pain. -Lipitor   Iron  deficiency anemia: Hemoglobin stable 8.8 (8.0-9.0 at baseline) - Continue iron  supplement  Multiple myeloma (HCC) -Patient is Pomalyst , supposed to restart on 6/21  Cocaine abuse (HCC) and Polysubstance abuse (HCC) - Did counsel about importance of quitting substance use - Check UDS      DVT ppx: SCD  Code Status: Full code   Family Communication:     not done, no family member is at bed side.      Disposition Plan:  Anticipate discharge back to previous environment  Consults called:  none  Admission status and Level of care: Telemetry Medical:    for obs     Dispo: The patient is from: Home              Anticipated d/c is to: Home              Anticipated d/c date is: 2 days              Patient currently is not medically stable to d/c.  Severity of Illness:  The appropriate patient status for this patient is INPATIENT. Inpatient status is judged to be reasonable and necessary in order to provide the required intensity of  service to ensure the patient's safety. The patient's presenting symptoms, physical exam findings, and initial radiographic and laboratory data in the context of their chronic comorbidities is felt to place them at high risk for further clinical deterioration. Furthermore, it is not anticipated that the patient will be medically stable for discharge from the hospital within 2 midnights of admission.   * I certify that at the point of admission it is my clinical judgment that the patient will require inpatient hospital care spanning beyond 2 midnights from the point of admission due to high intensity of service, high risk for further deterioration and high frequency of surveillance required.*       Date of Service 09/15/2023    Fidencio Hue [facility]   If 7PM-7AM, please contact night-coverage [email or web address] 09/15/2023, 1:48 AM

## 2023-09-15 NOTE — Hospital Course (Addendum)
 73 y.o. male with medical history significant of type 1 diabetes, DKA, cocaine abuse, orthostatic hypotension, hyperlipidemia, type 1 diabetes, stroke, BPH, CKD-3, PUD, anemia, multiple sclerosis, recurrent multiple falls, SDH, SAH, who presents with syncope and fall.   Patient was recently hospitalized from 5/30 - 6/3 due to fall which resulted in SDH.  He is Eliquis  is on hold.  Possibly due to orthostatic hypotension.  Patient was started as needed midodrine  and Florinef .   Patient has had four ED visits recently due to multiple falls.  He states that he fainted and fell in his kitchen earlier today.  Denies unilateral numbness or tingling in extremities.  No facial droop or slurred speech.  No new head injury.  He complains of left arm pain around elbow area, which is constant, severe, sharp, nonradiating, aggravated by movement.  Left arm is swollen, no erythema.  Denies chest pain, cough, SOB.  No nausea, vomiting, diarrhea or abdominal pain.  No symptoms of UTI.  He reports lightheadedness and dizziness intermittently.     Data reviewed independently and ED Course: pt was found to have WBC 4.0, stable hemoglobin, worsening renal function, temperature normal, blood pressure 100/59, heart rate 82, 15, oxygen saturation 95% on room air.  Patient is previously in telemetry bed position.   CT of head: 1. No acute intracranial abnormality. 2. Stable trace low-density chronic left subdural hematoma, without mass effect or midline shift. 3. Atrophy with small vessel ischemic changes.  6/18.  Patient feels okay.  Not wearing sling for his left arm.  Found to be orthostatic with physical therapy team. 6/19.  Hemoglobin down to 7.2, will transfuse 1 unit of packed red blood cells.  Likely had some bleeding into his left arm with the swelling and fracture. 6/20.  Refused labs today on 2 occasions.

## 2023-09-15 NOTE — Assessment & Plan Note (Addendum)
 Left humeral fracture seen on 6/12.  Patient noncompliant with sling.  I placed his arm in a sling today.  Repeated left shoulder x-ray which shows similar humeral fracture as previously seen.

## 2023-09-15 NOTE — Assessment & Plan Note (Addendum)
 Patient transfused 1 unit of packed red blood cells on a hemoglobin of 7.2.  Refused repeat labs

## 2023-09-15 NOTE — Assessment & Plan Note (Signed)
 Not a candidate with Eliquis  with subdural hematomas and falls.

## 2023-09-15 NOTE — ED Notes (Signed)
 Pt insulin  pump removed and placed in bag with pt label , sent upstairs with pt

## 2023-09-15 NOTE — Evaluation (Signed)
 Physical Therapy Evaluation Patient Details Name: Nicholas Weiss MRN: [id number] DOB: 06/14/1950 Today's Date: 09/15/2023  History of Present Illness  Nicholas Weiss is a 73 y.o. male with medical history significant of type 1 diabetes, DKA, cocaine abuse, orthostatic hypotension, hyperlipidemia, type 1 diabetes, stroke, BPH, CKD-3, PUD, anemia, multiple sclerosis, recurrent multiple falls, SDH, SAH, who presents with syncope and fall.   Clinical Impression  Pt admitted with above diagnosis. Pt currently with functional limitations due to the deficits listed below (see PT Problem List). Pt received in care of PT/OT to maximize tolerance for functional mobility. Pt with multiple admissions lately with spouse at bedside since last admission has needed extensive help with ADL's due to fractured L shoulder. Typically mod-I with SPC in RUE.   To date, placed ted hose in supine. Deferred donning abd binder until sitting due to L shoulder fx. Pt is maxA+2 for supine to sitting, and min to modA+2 for STS relying on regular minA in standing to run orthostatic reading. MinA+2 for R lateral side steps to EOB upon return to sitting due to positive readings and severe L shoulder pain. MaxA+2 to return to supine with all needs in reach. Pt will benefit from skilled PT services <3hours/day to address his deficits and maximize return to PLOF.   Orthostatic VS for the past 24 hrs:  BP- Lying Pulse- Lying BP- Sitting Pulse- Sitting BP- Standing at 0 minutes Pulse- Standing at 0 minutes  09/15/23 1615 121/72 72 95/84 84 (!) 76/49 92        If plan is discharge home, recommend the following: A little help with walking and/or transfers;A little help with bathing/dressing/bathroom;Assistance with cooking/housework;Assist for transportation;Help with stairs or ramp for entrance   Can travel by private vehicle   No    Equipment Recommendations None recommended by PT  Recommendations for Other Services        Functional Status Assessment Patient has had a recent decline in their functional status and demonstrates the ability to make significant improvements in function in a reasonable and predictable amount of time.     Precautions / Restrictions Precautions Precautions: Fall Recall of Precautions/Restrictions: Impaired Precaution/Restrictions Comments: Orthostatic hypotension. Ted hose/abd binder Restrictions Weight Bearing Restrictions Per Provider Order: Yes LUE Weight Bearing Per Provider Order: Non weight bearing Other Position/Activity Restrictions: Per MD, sling needed      Mobility  Bed Mobility Overal bed mobility: Needs Assistance Bed Mobility: Supine to Sit, Sit to Supine     Supine to sit: +2 for physical assistance, Max assist Sit to supine: +2 for physical assistance, Max assist     Patient Response: Cooperative  Transfers Overall transfer level: Needs assistance Equipment used: Rolling walker (2 wheels) Transfers: Sit to/from Stand Sit to Stand: Min assist, Mod assist, +2 physical assistance                Ambulation/Gait               General Gait Details: deferred due to orthostatic readings and severe L shoulder pain.  Stairs            Wheelchair Mobility     Tilt Bed Tilt Bed Patient Response: Cooperative  Modified Rankin (Stroke Patients Only)       Balance Overall balance assessment: Needs assistance Sitting-balance support: Feet supported, Single extremity supported Sitting balance-Leahy Scale: Fair     Standing balance support: During functional activity Standing balance-Leahy Scale: Poor  Pertinent Vitals/Pain Pain Assessment Faces Pain Scale: Hurts whole lot Pain Intervention(s): Monitored during session, Repositioned, Patient requesting pain meds-RN notified    Home Living Family/patient expects to be discharged to:: Private residence Living Arrangements:  Spouse/significant other Available Help at Discharge: Family;Available 24 hours/day Type of Home: House Home Access: Stairs to enter Entrance Stairs-Rails: Can reach both Entrance Stairs-Number of Steps: 1   Home Layout: One level Home Equipment: Cane - quad;Shower seat;Grab bars - toilet;Grab bars - tub/shower;Cane - single Librarian, academic (2 wheels) Additional Comments: uses SPC in RUE for gait.    Prior Function Prior Level of Function : Needs assist;History of Falls (last six months)             Mobility Comments: pt reports he walks with his cane ADLs Comments: Per spouse, since L humerus fx has been reliant on her for all ADL's and pt having multiple falls weekly     Extremity/Trunk Assessment   Upper Extremity Assessment Upper Extremity Assessment: Right hand dominant;LUE deficits/detail (Simultaneous filing. User may not have seen previous data.) LUE Deficits / Details: very limited L shoulder mobility. Unsure of ROM deficits or pain limited. Relies on RUE to mobilize LUE. (Simultaneous filing. User may not have seen previous data.)    Lower Extremity Assessment Lower Extremity Assessment: Generalized weakness    Cervical / Trunk Assessment Cervical / Trunk Assessment: Normal  Communication   Communication Communication: No apparent difficulties    Cognition Arousal: Alert Behavior During Therapy: WFL for tasks assessed/performed   PT - Cognitive impairments: History of cognitive impairments                         Following commands: Impaired Following commands impaired: Follows one step commands inconsistently     Cueing Cueing Techniques: Verbal cues, Tactile cues     General Comments      Exercises     Assessment/Plan    PT Assessment Patient needs continued PT services  PT Problem List Decreased strength;Decreased activity tolerance;Decreased balance;Decreased mobility;Decreased cognition;Decreased knowledge of use of  DME;Decreased safety awareness;Decreased range of motion;Pain       PT Treatment Interventions DME instruction;Balance training;Gait training;Stair training;Functional mobility training;Patient/family education;Therapeutic activities;Therapeutic exercise    PT Goals (Current goals can be found in the Care Plan section)  Acute Rehab PT Goals Patient Stated Goal: improve L shoulder pain PT Goal Formulation: With patient Time For Goal Achievement: 09/12/23 Potential to Achieve Goals: Fair    Frequency Min 2X/week     Co-evaluation   Reason for Co-Treatment: Complexity of the patient's impairments (multi-system involvement);For patient/therapist safety;To address functional/ADL transfers PT goals addressed during session: Mobility/safety with mobility OT goals addressed during session: ADL's and self-care       AM-PAC PT 6 Clicks Mobility  Outcome Measure Help needed turning from your back to your side while in a flat bed without using bedrails?: A Lot Help needed moving from lying on your back to sitting on the side of a flat bed without using bedrails?: A Lot Help needed moving to and from a bed to a chair (including a wheelchair)?: Total Help needed standing up from a chair using your arms (e.g., wheelchair or bedside chair)?: A Lot Help needed to walk in hospital room?: Total Help needed climbing 3-5 steps with a railing? : Total 6 Click Score: 9    End of Session Equipment Utilized During Treatment: Gait belt Activity Tolerance: Patient limited by pain;Other (comment) (orthostatic) Patient  left: in bed;with call bell/phone within reach;with bed alarm set;with family/visitor present Nurse Communication: Mobility status PT Visit Diagnosis: Unsteadiness on feet (R26.81);Repeated falls (R29.6);Muscle weakness (generalized) (M62.81);Difficulty in walking, not elsewhere classified (R26.2);Pain Pain - Right/Left: Left Pain - part of body: Shoulder    Time: 1478-2956 PT Time  Calculation (min) (ACUTE ONLY): 22 min   Charges:   PT Evaluation $PT Eval Moderate Complexity: 1 Mod   PT General Charges $$ ACUTE PT VISIT: 1 Visit         Paisly Fingerhut M. Fairly IV, PT, DPT Physical Therapist- [place]  [facility] 09/15/2023, 4:14 PM

## 2023-09-15 NOTE — Assessment & Plan Note (Addendum)
 Will increase midodrine  to 10 mg 3 times daily.  Florinef  to daily dosing.  Will give another fluid bolus today.

## 2023-09-15 NOTE — Progress Notes (Signed)
 Progress Note   Patient: Nicholas Weiss [id number]:[id number] DOB: Oct 20, 1950 DOA: 09/14/2023     0 DOS: the patient was seen and examined on 09/15/2023   Brief hospital course:  73 y.o. male with medical history significant of type 1 diabetes, DKA, cocaine abuse, orthostatic hypotension, hyperlipidemia, type 1 diabetes, stroke, BPH, CKD-3, PUD, anemia, multiple sclerosis, recurrent multiple falls, SDH, SAH, who presents with syncope and fall.   Patient was recently hospitalized from 5/30 - 6/3 due to fall which resulted in SDH.  He is Eliquis  is on hold.  Possibly due to orthostatic hypotension.  Patient was started as needed midodrine  and Florinef .   Patient has had four ED visits recently due to multiple falls.  He states that he fainted and fell in his kitchen earlier today.  Denies unilateral numbness or tingling in extremities.  No facial droop or slurred speech.  No new head injury.  He complains of left arm pain around elbow area, which is constant, severe, sharp, nonradiating, aggravated by movement.  Left arm is swollen, no erythema.  Denies chest pain, cough, SOB.  No nausea, vomiting, diarrhea or abdominal pain.  No symptoms of UTI.  He reports lightheadedness and dizziness intermittently.     Data reviewed independently and ED Course: pt was found to have WBC 4.0, stable hemoglobin, worsening renal function, temperature normal, blood pressure 100/59, heart rate 82, 15, oxygen saturation 95% on room air.  Patient is previously in telemetry bed position.   CT of head: 1. No acute intracranial abnormality. 2. Stable trace low-density chronic left subdural hematoma, without mass effect or midline shift. 3. Atrophy with small vessel ischemic changes.  6/18.  Patient feels okay.  Not wearing sling for his left arm.  Found to be orthostatic with physical therapy team.  Assessment and Plan: * Orthostatic hypotension Will increase midodrine  to 10 mg 3 times daily.  Florinef  to daily dosing.   Will give another fluid bolus today.  Syncope and collapse, recurrent Secondary to orthostatic hypotension  Left arm pain Left humeral fracture seen on 6/12.  Patient noncompliant with sling.  Ordered sling.  Repeated left shoulder x-ray.  SDH (subdural hematoma) (HCC) Eliquis  on hold.  CT scan shows stable subdural.  Acute renal failure superimposed on stage 3a chronic kidney disease (HCC) Creatinine 1.73 on presentation.  Today's creatinine 1.64.  Baseline creatinine 1.31  Dyslipidemia On Lipitor   Type 1 diabetes mellitus with diabetic neuropathy, unspecified (HCC) Semglee  insulin  and sliding scale insulin  ordered.  Patient uses insulin  pump at home.  Seizure disorder (HCC) On Keppra   Paroxysmal atrial fibrillation (HCC) Not a candidate with Eliquis  with subdural hematomas and falls.        Subjective: Patient feels okay.  Not wearing his sling for his humeral fracture.  Admitted with repeat falls.  Stable subdural.  Physical Exam: Vitals:   09/15/23 0154 09/15/23 0338 09/15/23 0721 09/15/23 1611  BP: (!) 152/114 110/63 98/68 (!) 142/76  Pulse: 74 83 98 69  Resp:   18 18  Temp: (!) 97.4 F (36.3 C)  98.5 F (36.9 C) (!) 97.5 F (36.4 C)  TempSrc:    Oral  SpO2: 100% 98% 100% 100%  Weight: 75.5 kg     Height: 5' 10 (1.778 m)      Physical Exam HENT:     Head: Normocephalic.     Mouth/Throat:     Pharynx: No oropharyngeal exudate.   Eyes:     General: Lids are normal.  Conjunctiva/sclera: Conjunctivae normal.    Cardiovascular:     Rate and Rhythm: Normal rate and regular rhythm.     Heart sounds: Normal heart sounds, S1 normal and S2 normal.  Pulmonary:     Breath sounds: No decreased breath sounds, wheezing, rhonchi or rales.  Abdominal:     Palpations: Abdomen is soft.     Tenderness: There is no abdominal tenderness.   Musculoskeletal:     Right upper arm: Swelling present.     Right lower leg: No swelling.     Left lower leg: No  swelling.   Skin:    General: Skin is warm.     Findings: No rash.   Neurological:     Mental Status: He is alert.     Data Reviewed: Creatinine 1.64, hemoglobin 8.1  Family Communication: Spoke with [name] on the phone  Disposition: Status is: Observation Physical therapy recommending rehab.  Patient is orthostatic.  Will give another fluid bolus.  Increase midodrine  to 10 mg 3 times daily.  Florinef  once a day.  Planned Discharge Destination: Skilled nursing facility    Time spent: 28 minutes  Author: Verla Glaze, MD 09/15/2023 6:37 PM  For on call review [email or web address].

## 2023-09-15 NOTE — ED Notes (Signed)
 Sandwich tray given and gingerale zero

## 2023-09-15 NOTE — Progress Notes (Signed)
 Eeg done

## 2023-09-15 NOTE — Assessment & Plan Note (Addendum)
 Creatinine 1.73 on presentation.  Today's creatinine 1.64.  Baseline creatinine 1.31

## 2023-09-15 NOTE — Assessment & Plan Note (Signed)
 Eliquis  on hold.  CT scan shows stable subdural.

## 2023-09-16 DIAGNOSIS — Z8249 Family history of ischemic heart disease and other diseases of the circulatory system: Secondary | ICD-10-CM | POA: Diagnosis not present

## 2023-09-16 DIAGNOSIS — E1040 Type 1 diabetes mellitus with diabetic neuropathy, unspecified: Secondary | ICD-10-CM | POA: Diagnosis present

## 2023-09-16 DIAGNOSIS — Y92000 Kitchen of unspecified non-institutional (private) residence as  the place of occurrence of the external cause: Secondary | ICD-10-CM | POA: Diagnosis not present

## 2023-09-16 DIAGNOSIS — G40909 Epilepsy, unspecified, not intractable, without status epilepticus: Secondary | ICD-10-CM | POA: Diagnosis present

## 2023-09-16 DIAGNOSIS — Z7901 Long term (current) use of anticoagulants: Secondary | ICD-10-CM | POA: Diagnosis not present

## 2023-09-16 DIAGNOSIS — W19XXXA Unspecified fall, initial encounter: Secondary | ICD-10-CM | POA: Diagnosis present

## 2023-09-16 DIAGNOSIS — Z79899 Other long term (current) drug therapy: Secondary | ICD-10-CM | POA: Diagnosis not present

## 2023-09-16 DIAGNOSIS — C9000 Multiple myeloma not having achieved remission: Secondary | ICD-10-CM | POA: Diagnosis present

## 2023-09-16 DIAGNOSIS — N179 Acute kidney failure, unspecified: Secondary | ICD-10-CM | POA: Diagnosis present

## 2023-09-16 DIAGNOSIS — E1022 Type 1 diabetes mellitus with diabetic chronic kidney disease: Secondary | ICD-10-CM | POA: Diagnosis present

## 2023-09-16 DIAGNOSIS — I951 Orthostatic hypotension: Secondary | ICD-10-CM | POA: Diagnosis present

## 2023-09-16 DIAGNOSIS — G35 Multiple sclerosis: Secondary | ICD-10-CM | POA: Diagnosis present

## 2023-09-16 DIAGNOSIS — D631 Anemia in chronic kidney disease: Secondary | ICD-10-CM

## 2023-09-16 DIAGNOSIS — E7800 Pure hypercholesterolemia, unspecified: Secondary | ICD-10-CM | POA: Diagnosis present

## 2023-09-16 DIAGNOSIS — I5A Non-ischemic myocardial injury (non-traumatic): Secondary | ICD-10-CM | POA: Diagnosis present

## 2023-09-16 DIAGNOSIS — S42302A Unspecified fracture of shaft of humerus, left arm, initial encounter for closed fracture: Secondary | ICD-10-CM | POA: Diagnosis present

## 2023-09-16 DIAGNOSIS — S065XAA Traumatic subdural hemorrhage with loss of consciousness status unknown, initial encounter: Secondary | ICD-10-CM | POA: Diagnosis present

## 2023-09-16 DIAGNOSIS — R569 Unspecified convulsions: Secondary | ICD-10-CM | POA: Diagnosis not present

## 2023-09-16 DIAGNOSIS — F1410 Cocaine abuse, uncomplicated: Secondary | ICD-10-CM | POA: Diagnosis present

## 2023-09-16 DIAGNOSIS — N1831 Chronic kidney disease, stage 3a: Secondary | ICD-10-CM | POA: Diagnosis present

## 2023-09-16 DIAGNOSIS — E860 Dehydration: Secondary | ICD-10-CM | POA: Diagnosis present

## 2023-09-16 DIAGNOSIS — I129 Hypertensive chronic kidney disease with stage 1 through stage 4 chronic kidney disease, or unspecified chronic kidney disease: Secondary | ICD-10-CM | POA: Diagnosis present

## 2023-09-16 DIAGNOSIS — R55 Syncope and collapse: Secondary | ICD-10-CM | POA: Diagnosis not present

## 2023-09-16 DIAGNOSIS — R710 Precipitous drop in hematocrit: Secondary | ICD-10-CM

## 2023-09-16 DIAGNOSIS — Z794 Long term (current) use of insulin: Secondary | ICD-10-CM | POA: Diagnosis not present

## 2023-09-16 DIAGNOSIS — R296 Repeated falls: Secondary | ICD-10-CM | POA: Diagnosis present

## 2023-09-16 DIAGNOSIS — M79602 Pain in left arm: Secondary | ICD-10-CM | POA: Diagnosis not present

## 2023-09-16 DIAGNOSIS — I480 Paroxysmal atrial fibrillation: Secondary | ICD-10-CM | POA: Diagnosis present

## 2023-09-16 DIAGNOSIS — N400 Enlarged prostate without lower urinary tract symptoms: Secondary | ICD-10-CM | POA: Diagnosis present

## 2023-09-16 DIAGNOSIS — D509 Iron deficiency anemia, unspecified: Secondary | ICD-10-CM | POA: Diagnosis present

## 2023-09-16 LAB — BASIC METABOLIC PANEL WITH GFR
Anion gap: 7 (ref 5–15)
BUN: 36 mg/dL — ABNORMAL HIGH (ref 8–23)
CO2: 21 mmol/L — ABNORMAL LOW (ref 22–32)
Calcium: 8 mg/dL — ABNORMAL LOW (ref 8.9–10.3)
Chloride: 109 mmol/L (ref 98–111)
Creatinine, Ser: 1.25 mg/dL — ABNORMAL HIGH (ref 0.61–1.24)
GFR, Estimated: >60 mL/min (ref >60)
Glucose, Bld: 195 mg/dL — ABNORMAL HIGH (ref 70–99)
Potassium: 4.1 mmol/L (ref 3.5–5.1)
Sodium: 137 mmol/L (ref 135–145)

## 2023-09-16 LAB — URINE DRUG SCREEN, QUALITATIVE (ARMC ONLY)
Amphetamines, Ur Screen: NOT DETECTED
Barbiturates, Ur Screen: NOT DETECTED
Benzodiazepine, Ur Scrn: NOT DETECTED
Cannabinoid 50 Ng, Ur ~~LOC~~: NOT DETECTED
Cocaine Metabolite,Ur ~~LOC~~: NOT DETECTED
MDMA (Ecstasy)Ur Screen: NOT DETECTED
Methadone Scn, Ur: NOT DETECTED
Opiate, Ur Screen: NOT DETECTED
Phencyclidine (PCP) Ur S: NOT DETECTED
Tricyclic, Ur Screen: NOT DETECTED

## 2023-09-16 LAB — GLUCOSE, CAPILLARY
Glucose-Capillary: 119 mg/dL — ABNORMAL HIGH (ref 70–99)
Glucose-Capillary: 135 mg/dL — ABNORMAL HIGH (ref 70–99)
Glucose-Capillary: 151 mg/dL — ABNORMAL HIGH (ref 70–99)
Glucose-Capillary: 206 mg/dL — ABNORMAL HIGH (ref 70–99)
Glucose-Capillary: 76 mg/dL (ref 70–99)
Glucose-Capillary: 86 mg/dL (ref 70–99)

## 2023-09-16 LAB — URINALYSIS, ROUTINE W REFLEX MICROSCOPIC
Bilirubin Urine: NEGATIVE
Glucose, UA: 150 mg/dL — AB
Hgb urine dipstick: NEGATIVE
Ketones, ur: 5 mg/dL — AB
Leukocytes,Ua: NEGATIVE
Nitrite: NEGATIVE
Protein, ur: NEGATIVE mg/dL
Specific Gravity, Urine: 1.019 (ref 1.005–1.030)
pH: 5 (ref 5.0–8.0)

## 2023-09-16 LAB — PREPARE RBC (CROSSMATCH)

## 2023-09-16 LAB — CBC
HCT: 22.4 % — ABNORMAL LOW (ref 39.0–52.0)
Hemoglobin: 7.2 g/dL — ABNORMAL LOW (ref 13.0–17.0)
MCH: 30.6 pg (ref 26.0–34.0)
MCHC: 32.1 g/dL (ref 30.0–36.0)
MCV: 95.3 fL (ref 80.0–100.0)
Platelets: 299 10*3/uL (ref 150–400)
RBC: 2.35 MIL/uL — ABNORMAL LOW (ref 4.22–5.81)
RDW: 17.7 % — ABNORMAL HIGH (ref 11.5–15.5)
WBC: 3.6 10*3/uL — ABNORMAL LOW (ref 4.0–10.5)
nRBC: 0 % (ref 0.0–0.2)

## 2023-09-16 MED ORDER — SODIUM CHLORIDE 0.9% IV SOLUTION: Freq: Once | INTRAVENOUS | Status: AC

## 2023-09-16 NOTE — Progress Notes (Signed)
 Patient removed sling from left arm.

## 2023-09-16 NOTE — Plan of Care (Signed)

## 2023-09-16 NOTE — Inpatient Diabetes Management (Signed)
 Inpatient Diabetes Program Recommendations  AACE/ADA: New Consensus Statement on Inpatient Glycemic Control (2015)  Target Ranges:  Prepandial:   less than 140 mg/dL      Peak postprandial:   less than 180 mg/dL (1-2 hours)      Critically ill patients:  140 - 180 mg/dL    Latest Reference Range & Units 09/15/23 07:36 09/15/23 12:00 09/15/23 16:13 09/15/23 19:54 09/16/23 00:34  Glucose-Capillary 70 - 99 mg/dL 366 (H)  5 units Novolog   4 units Semglee  @0949   280 (H)  5 units Novolog   103 (H) 110 (H) 151 (H)  (H): Data is abnormally high  Latest Reference Range & Units 09/16/23 07:53 09/16/23 11:27  Glucose-Capillary 70 - 99 mg/dL 440 (H)  3 units Novolog   135 (H)  1 unit Novolog   4 units Semglee    (H): Data is abnormally high   History: Type 1 Diabetes, DKA, Cocaine Abuse, CKD3, SDH   Home DM Meds: Insulin  Pump                             Dexcom G7 CGM                             Humalog in the Insulin  Pump   Current Orders: Semglee  4 units daily     Novolog  Sensitive Correction Scale/ SSI (0-9 units) TID AC + HS      Added Florinef  0.1 mg daily today  MD- Note AM CBG >200 today.  May consider increasing the Semglee  very slightly to 5 units daily    --Will follow patient during hospitalization--  Langston Pippins RN, MSN, CDCES Diabetes Coordinator Inpatient Glycemic Control Team Team Pager: [phone] (8a-5p)

## 2023-09-16 NOTE — Progress Notes (Signed)
 Progress Note   Patient: Nicholas Weiss [id number] DOB: June 20, 1950 DOA: 09/14/2023     0 DOS: the patient was seen and examined on 09/16/2023   Brief hospital course:  73 y.o. male with medical history significant of type 1 diabetes, DKA, cocaine abuse, orthostatic hypotension, hyperlipidemia, type 1 diabetes, stroke, BPH, CKD-3, PUD, anemia, multiple sclerosis, recurrent multiple falls, SDH, SAH, who presents with syncope and fall.   Patient was recently hospitalized from 5/30 - 6/3 due to fall which resulted in SDH.  He is Eliquis  is on hold.  Possibly due to orthostatic hypotension.  Patient was started as needed midodrine  and Florinef .   Patient has had four ED visits recently due to multiple falls.  He states that he fainted and fell in his kitchen earlier today.  Denies unilateral numbness or tingling in extremities.  No facial droop or slurred speech.  No new head injury.  He complains of left arm pain around elbow area, which is constant, severe, sharp, nonradiating, aggravated by movement.  Left arm is swollen, no erythema.  Denies chest pain, cough, SOB.  No nausea, vomiting, diarrhea or abdominal pain.  No symptoms of UTI.  He reports lightheadedness and dizziness intermittently.     Data reviewed independently and ED Course: pt was found to have WBC 4.0, stable hemoglobin, worsening renal function, temperature normal, blood pressure 100/59, heart rate 82, 15, oxygen saturation 95% on room air.  Patient is previously in telemetry bed position.   CT of head: 1. No acute intracranial abnormality. 2. Stable trace low-density chronic left subdural hematoma, without mass effect or midline shift. 3. Atrophy with small vessel ischemic changes.  6/18.  Patient feels okay.  Not wearing sling for his left arm.  Found to be orthostatic with physical therapy team. 6/19.  Hemoglobin down to 7.2, will transfuse 1 unit of packed red blood cells.  Likely had some bleeding into his left arm with  the swelling and fracture.  Assessment and Plan: * Drop in hemoglobin Hemoglobin dropped down to 7.2.  Likely with IV fluid boluses and also may have had some bleeding in his arm with the fracture.  Benefits and risk of blood transfusion explained.  1 unit of packed red blood cells ordered.  Orthostatic hypotension Increase midodrine  to 10 mg 3 times daily.  Florinef  to daily dosing.  Unit of blood will also help.  TED hose and abdominal binder.  Syncope and collapse, recurrent Secondary to orthostatic hypotension  Left arm pain Left humeral fracture seen on 6/12.  Patient noncompliant with sling.  Ordered sling.  Repeated left shoulder x-ray which shows similar humeral fracture as previously seen.  SDH (subdural hematoma) (HCC) Eliquis  on hold.  CT scan shows stable subdural.  Acute renal failure superimposed on stage 3a chronic kidney disease (HCC) Creatinine 1.73 on presentation.  Today's creatinine 1.25.  Baseline creatinine 1.31  Dyslipidemia On Lipitor   Type 1 diabetes mellitus with diabetic neuropathy, unspecified (HCC) Semglee  insulin  and sliding scale insulin  ordered.  Patient uses insulin  pump at home.  Seizure disorder (HCC) On Keppra   Paroxysmal atrial fibrillation (HCC) Not a candidate with Eliquis  with subdural hematomas and falls.  Anemia due to chronic kidney disease With hemoglobin dropping down to 7.2 will give a unit of blood.        Subjective: Patient feeling okay.  Not really having pain in his arm.  He was upset that I told him that his hemoglobin was low.  Spoke with patient and [name] on  the phone and consented for blood transfusion.  Physical Exam: Vitals:   09/16/23 0752 09/16/23 0947 09/16/23 1002 09/16/23 1128  BP: (!) 134/91 (!) 133/93 125/88 (!) 142/96  Pulse: 80 89 87 68  Resp: 16 18 18 16   Temp: 98.7 F (37.1 C) (!) 97.5 F (36.4 C) 97.6 F (36.4 C) 97.9 F (36.6 C)  TempSrc: Oral Oral Oral   SpO2: 100% 100% 100% 100%  Weight:       Height:       Physical Exam HENT:     Head: Normocephalic.     Mouth/Throat:     Pharynx: No oropharyngeal exudate.   Eyes:     General: Lids are normal.     Conjunctiva/sclera: Conjunctivae normal.    Cardiovascular:     Rate and Rhythm: Normal rate and regular rhythm.     Heart sounds: S1 normal and S2 normal. Murmur heard.     Systolic murmur is present with a grade of 2/6.  Pulmonary:     Breath sounds: No decreased breath sounds, wheezing, rhonchi or rales.  Abdominal:     Palpations: Abdomen is soft.     Tenderness: There is no abdominal tenderness.   Musculoskeletal:     Right upper arm: Swelling present.     Right lower leg: No swelling.     Left lower leg: No swelling.   Skin:    General: Skin is warm.     Findings: No rash.   Neurological:     Mental Status: He is alert.     Data Reviewed: Hemoglobin 7.2, creatinine 1.25  Family Communication: Spoke with [name] on the phone  Disposition: Status is: Observation We will transfuse 1 unit of packed red blood cells.  PT recommending rehab.  TOC to look into rehab beds  Planned Discharge Destination: Skilled nursing facility    Time spent: 28 minutes  Author: Verla Glaze, MD 09/16/2023 11:44 AM  For on call review [email or web address].

## 2023-09-16 NOTE — Progress Notes (Signed)
 Occupational Therapy Treatment Patient Details Name: Nicholas Weiss MRN: [id number] DOB: 1950-06-18 Today's Date: 09/16/2023   History of present illness Nicholas Weiss is a 73 y.o. male with medical history significant of type 1 diabetes, DKA, cocaine abuse, orthostatic hypotension, hyperlipidemia, type 1 diabetes, stroke, BPH, CKD-3, PUD, anemia, multiple sclerosis, recurrent multiple falls, SDH, SAH, who presents with syncope and fall.   OT comments  Nicholas Weiss seen for OT treatment on this date. Upon arrival to room pt in bed, agreeable to tx. Pt requires MAX A to don sling for L shoulder; MOD A +2 for safety for STS at EOB and to ambulate around to other side of bed. Pt was positive for orthostatics during session, see flowsheet.  Pt making progress toward goals, will continue to follow POC. Discharge recommendation remains appropriate.        If plan is discharge home, recommend the following:  Assistance with cooking/housework;Supervision due to cognitive status;Assist for transportation;A lot of help with walking and/or transfers;A lot of help with bathing/dressing/bathroom;Help with stairs or ramp for entrance   Equipment Recommendations  Other (comment)    Recommendations for Other Services      Precautions / Restrictions Precautions Precautions: Fall Recall of Precautions/Restrictions: Impaired Precaution/Restrictions Comments: Orthostatic hypotension. Ted hose/abd binder Restrictions Weight Bearing Restrictions Per Provider Order: Yes LUE Weight Bearing Per Provider Order: Non weight bearing       Mobility Bed Mobility Overal bed mobility: Needs Assistance Bed Mobility: Supine to Sit, Sit to Supine     Supine to sit: Min assist Sit to supine: +2 for physical assistance, Min assist        Transfers Overall transfer level: Needs assistance Equipment used: None Transfers: Sit to/from Stand Sit to Stand: Mod assist, +2 safety/equipment                  Balance Overall balance assessment: Needs assistance Sitting-balance support: Feet supported, No upper extremity supported Sitting balance-Leahy Scale: Fair     Standing balance support: Single extremity supported Standing balance-Leahy Scale: Poor                             ADL either performed or assessed with clinical judgement   ADL Overall ADL's : Needs assistance/impaired                                       General ADL Comments: MAX A to don shoulder sling    Extremity/Trunk Assessment Upper Extremity Assessment Upper Extremity Assessment: Generalized weakness   Lower Extremity Assessment Lower Extremity Assessment: Generalized weakness        Vision       Perception     Praxis     Communication Communication Communication: No apparent difficulties   Cognition Arousal: Alert Behavior During Therapy: WFL for tasks assessed/performed, Flat affect Cognition: History of cognitive impairments                               Following commands: Impaired Following commands impaired: Follows one step commands inconsistently      Cueing   Cueing Techniques: Verbal cues, Tactile cues  Exercises      Shoulder Instructions       General Comments      Pertinent Vitals/ Pain  Pain Assessment Pain Assessment: Faces Faces Pain Scale: Hurts a little bit Pain Location: L shoulder Pain Descriptors / Indicators: Grimacing, Guarding Pain Intervention(s): Limited activity within patient's tolerance, Monitored during session, Repositioned  Home Living                                          Prior Functioning/Environment              Frequency  Min 2X/week        Progress Toward Goals  OT Goals(current goals can now be found in the care plan section)  Progress towards OT goals: Progressing toward goals  Acute Rehab OT Goals Patient Stated Goal: to get stronger OT Goal Formulation:  With patient Time For Goal Achievement: 09/30/23 Potential to Achieve Goals: Fair ADL Goals Pt Will Perform Grooming: with supervision;standing Pt Will Perform Upper Body Dressing: with min assist;sitting Pt Will Perform Lower Body Dressing: with modified independence;sit to/from stand Pt Will Transfer to Toilet: with supervision;ambulating Pt Will Perform Toileting - Clothing Manipulation and hygiene: with supervision;sit to/from stand  Plan      Co-evaluation                 AM-PAC OT 6 Clicks Daily Activity     Outcome Measure   Help from another person eating meals?: None Help from another person taking care of personal grooming?: A Little Help from another person toileting, which includes using toliet, bedpan, or urinal?: A Lot Help from another person bathing (including washing, rinsing, drying)?: A Lot Help from another person to put on and taking off regular upper body clothing?: A Lot Help from another person to put on and taking off regular lower body clothing?: A Lot 6 Click Score: 15    End of Session Equipment Utilized During Treatment: Gait belt  OT Visit Diagnosis: Unsteadiness on feet (R26.81);Other abnormalities of gait and mobility (R26.89)   Activity Tolerance Patient limited by fatigue   Patient Left in bed;with call bell/phone within reach;with bed alarm set   Nurse Communication          Time: 9629-5284 OT Time Calculation (min): 15 min  Charges: OT General Charges $OT Visit: 1 Visit OT Treatments $Self Care/Home Management : 8-22 mins  Stevenson Elbe, Student OT   [employer] 09/16/2023, 3:00 PM

## 2023-09-16 NOTE — Progress Notes (Signed)
 Mobility Specialist - Progress Note   09/16/23 1600  Mobility  Activity Stood at bedside  Level of Assistance Contact guard assist, steadying assist  Assistive Device Other (Comment)  Activity Response Tolerated well  Mobility visit 1 Mobility     Mobility responded to bed alarm. Pt attempting to exit bed on arrival for bathroom use. Assisted pt with standing minA d/t limited use of LUE. Pt stood ~ 5 minutes before returning supine. Sways in standing. Pt left in bed with alarm set, needs in reach.   [name] Mobility Specialist 09/16/23, 4:37 PM

## 2023-09-16 NOTE — Assessment & Plan Note (Addendum)
 Hemoglobin dropped down to 7.2.  Likely with IV fluid boluses and also may have had some bleeding in his arm with the fracture.  Patient given 1 unit of packed red blood cells on 6/19.  Hemoglobin 10.8 upon discharge.

## 2023-09-17 DIAGNOSIS — N179 Acute kidney failure, unspecified: Secondary | ICD-10-CM | POA: Diagnosis not present

## 2023-09-17 DIAGNOSIS — M79602 Pain in left arm: Secondary | ICD-10-CM | POA: Diagnosis not present

## 2023-09-17 DIAGNOSIS — S065XAA Traumatic subdural hemorrhage with loss of consciousness status unknown, initial encounter: Secondary | ICD-10-CM | POA: Diagnosis not present

## 2023-09-17 DIAGNOSIS — I951 Orthostatic hypotension: Secondary | ICD-10-CM | POA: Diagnosis not present

## 2023-09-17 LAB — BASIC METABOLIC PANEL WITH GFR
Anion gap: 8 (ref 5–15)
BUN: 25 mg/dL — ABNORMAL HIGH (ref 8–23)
CO2: 25 mmol/L (ref 22–32)
Calcium: 8.3 mg/dL — ABNORMAL LOW (ref 8.9–10.3)
Chloride: 107 mmol/L (ref 98–111)
Creatinine, Ser: 1.13 mg/dL (ref 0.61–1.24)
GFR, Estimated: >60 mL/min (ref >60)
Glucose, Bld: 66 mg/dL — ABNORMAL LOW (ref 70–99)
Potassium: 3.7 mmol/L (ref 3.5–5.1)
Sodium: 140 mmol/L (ref 135–145)

## 2023-09-17 LAB — GLUCOSE, CAPILLARY
Glucose-Capillary: 93 mg/dL (ref 70–99)
Glucose-Capillary: 107 mg/dL — ABNORMAL HIGH (ref 70–99)
Glucose-Capillary: 189 mg/dL — ABNORMAL HIGH (ref 70–99)
Glucose-Capillary: 82 mg/dL (ref 70–99)
Glucose-Capillary: 101 mg/dL — ABNORMAL HIGH (ref 70–99)

## 2023-09-17 LAB — BPAM RBC: Unit Type and Rh: 6200

## 2023-09-17 LAB — TYPE AND SCREEN: Unit division: 0

## 2023-09-17 LAB — CBC
HCT: 32.3 % — ABNORMAL LOW (ref 39.0–52.0)
Hemoglobin: 10.8 g/dL — ABNORMAL LOW (ref 13.0–17.0)
MCH: 30.9 pg (ref 26.0–34.0)
MCHC: 33.4 g/dL (ref 30.0–36.0)
MCV: 92.3 fL (ref 80.0–100.0)
Platelets: 339 10*3/uL (ref 150–400)
RBC: 3.5 MIL/uL — ABNORMAL LOW (ref 4.22–5.81)
RDW: 18 % — ABNORMAL HIGH (ref 11.5–15.5)
WBC: 3.8 10*3/uL — ABNORMAL LOW (ref 4.0–10.5)
nRBC: 0.5 % — ABNORMAL HIGH (ref 0.0–0.2)

## 2023-09-17 MED ORDER — MIDODRINE HCL 5 MG PO TABS
2.5000 mg | ORAL_TABLET | Freq: Three times a day (TID) | ORAL | Status: DC
  Administered 2023-09-18: 2.5 mg via ORAL
  Filled 2023-09-17: qty 1

## 2023-09-17 MED ORDER — INSULIN GLARGINE-YFGN 100 UNIT/ML ~~LOC~~ SOLN: 4.0000 [IU] | Freq: Every day | SUBCUTANEOUS | Status: DC

## 2023-09-17 MED ORDER — GABAPENTIN 300 MG PO CAPS: 300.0000 mg | ORAL_CAPSULE | Freq: Two times a day (BID) | ORAL | 0 refills | Status: DC

## 2023-09-17 MED ORDER — BISACODYL EC 5 MG PO TBEC: 5.0000 mg | DELAYED_RELEASE_TABLET | Freq: Every evening | ORAL | Status: DC | PRN

## 2023-09-17 MED ORDER — ACETAMINOPHEN 325 MG PO TABS: 650.0000 mg | ORAL_TABLET | Freq: Four times a day (QID) | ORAL | Status: AC | PRN

## 2023-09-17 MED ORDER — MIDODRINE HCL 5 MG PO TABS: 5.0000 mg | ORAL_TABLET | Freq: Three times a day (TID) | ORAL | Status: DC

## 2023-09-17 MED ORDER — FLUDROCORTISONE ACETATE 0.1 MG PO TABS: 0.1000 mg | ORAL_TABLET | Freq: Every day | ORAL | Status: DC | PRN

## 2023-09-17 MED ORDER — INSULIN ASPART 100 UNIT/ML IJ SOLN: 1.0000 [IU] | Freq: Three times a day (TID) | INTRAMUSCULAR | Status: DC

## 2023-09-17 MED ORDER — OXYCODONE-ACETAMINOPHEN 5-325 MG PO TABS: 1.0000 | ORAL_TABLET | Freq: Four times a day (QID) | ORAL | 0 refills | Status: DC | PRN

## 2023-09-17 NOTE — Progress Notes (Signed)
 Progress Note   Patient: Nicholas Weiss WUJ:[id number] DOB: 06-28-1950 DOA: 09/14/2023     1 DOS: the patient was seen and examined on 09/17/2023   Brief hospital course:  73 y.o. male with medical history significant of type 1 diabetes, DKA, cocaine abuse, orthostatic hypotension, hyperlipidemia, type 1 diabetes, stroke, BPH, CKD-3, PUD, anemia, multiple sclerosis, recurrent multiple falls, SDH, SAH, who presents with syncope and fall.   Patient was recently hospitalized from 5/30 - 6/3 due to fall which resulted in SDH.  He is Eliquis  is on hold.  Possibly due to orthostatic hypotension.  Patient was started as needed midodrine  and Florinef .   Patient has had four ED visits recently due to multiple falls.  He states that he fainted and fell in his kitchen earlier today.  Denies unilateral numbness or tingling in extremities.  No facial droop or slurred speech.  No new head injury.  He complains of left arm pain around elbow area, which is constant, severe, sharp, nonradiating, aggravated by movement.  Left arm is swollen, no erythema.  Denies chest pain, cough, SOB.  No nausea, vomiting, diarrhea or abdominal pain.  No symptoms of UTI.  He reports lightheadedness and dizziness intermittently.     Data reviewed independently and ED Course: pt was found to have WBC 4.0, stable hemoglobin, worsening renal function, temperature normal, blood pressure 100/59, heart rate 82, 15, oxygen saturation 95% on room air.  Patient is previously in telemetry bed position.   CT of head: 1. No acute intracranial abnormality. 2. Stable trace low-density chronic left subdural hematoma, without mass effect or midline shift. 3. Atrophy with small vessel ischemic changes.  6/18.  Patient feels okay.  Not wearing sling for his left arm.  Found to be orthostatic with physical therapy team. 6/19.  Hemoglobin down to 7.2, will transfuse 1 unit of packed red blood cells.  Likely had some bleeding into his left arm with  the swelling and fracture. 6/20.  Refused labs today on 2 occasions.  Assessment and Plan: * Drop in hemoglobin Hemoglobin dropped down to 7.2.  Likely with IV fluid boluses and also may have had some bleeding in his arm with the fracture.  Patient given 1 unit of packed red blood cells on 6/19.  Patient refused repeat labs today.  Will try again tomorrow.  Syncope and collapse, recurrent Secondary to orthostatic hypotension  Orthostatic hypotension Blood pressure high last night.  Will decrease dose of midodrine  and hold Florinef .  Continue TED hose and abdominal binder.  Left arm pain Left humeral fracture seen on 6/12.  Patient noncompliant with sling.  I placed his arm in a sling today.  Repeated left shoulder x-ray which shows similar humeral fracture as previously seen.  SDH (subdural hematoma) (HCC) Eliquis  on hold.  CT scan shows stable subdural.  Acute renal failure superimposed on stage 3a chronic kidney disease (HCC) Creatinine 1.73 on presentation.  Last creatinine 1.25.  Baseline creatinine 1.31  Dyslipidemia On Lipitor   Type 1 diabetes mellitus with diabetic neuropathy, unspecified (HCC) Semglee  insulin  and sliding scale insulin  ordered.  Patient uses insulin  pump at home.  Seizure disorder (HCC) On Keppra   Paroxysmal atrial fibrillation (HCC) Not a candidate with Eliquis  with subdural hematomas and falls.  Anemia due to chronic kidney disease Patient transfused 1 unit of packed red blood cells on a hemoglobin of 7.2.  Refused repeat labs        Subjective: Patient's arm placed in a sling this morning.  Patient  feels okay and offers no complaints.  Blood pressure up last night so needed to decrease midodrine  dose.  Physical Exam: Vitals:   09/16/23 1942 09/17/23 0332 09/17/23 0728 09/17/23 1217  BP: (!) 142/84 (!) 182/99 (!) 158/92 (!) 162/119  Pulse: 64 83 86 78  Resp: 18 18 18    Temp: (!) 97.5 F (36.4 C) 98.2 F (36.8 C) 97.8 F (36.6 C)    TempSrc:      SpO2: 100% 100% 100%   Weight:      Height:       Physical Exam HENT:     Head: Normocephalic.     Mouth/Throat:     Pharynx: No oropharyngeal exudate.   Eyes:     General: Lids are normal.     Conjunctiva/sclera: Conjunctivae normal.    Cardiovascular:     Rate and Rhythm: Normal rate and regular rhythm.     Heart sounds: S1 normal and S2 normal. Murmur heard.     Systolic murmur is present with a grade of 2/6.  Pulmonary:     Breath sounds: No decreased breath sounds, wheezing, rhonchi or rales.  Abdominal:     Palpations: Abdomen is soft.     Tenderness: There is no abdominal tenderness.   Musculoskeletal:     Right upper arm: Swelling present.     Right lower leg: No swelling.     Left lower leg: No swelling.   Skin:    General: Skin is warm.     Findings: No rash.   Neurological:     Mental Status: He is alert.     Data Reviewed: Patient refused labs on 2 occasions today  Family Communication: Spoke with [name] and let her know that he refused labs.  Disposition: Status is: Inpatient Remains inpatient appropriate because: TOC working on a rehab bed and will also need insurance authorization  Planned Discharge Destination: Rehab    Time spent: 28 minutes  Author: Verla Glaze, MD 09/17/2023 1:09 PM  For on call review [email or web address].

## 2023-09-17 NOTE — Discharge Summary (Signed)
 Physician Discharge Summary   Patient: Nicholas Weiss MRN: [id number] DOB: [birth year]  Admit date:     09/14/2023  Discharge date: 09/18/2023  Discharge Physician: Verla Glaze   PCP: Monique Ano, MD   Recommendations at discharge:   Follow-up team at rehab Follow-up with PCP upon discharge from rehab Follow-up orthopedic surgery  Discharge Diagnoses: Principal Problem:   Drop in hemoglobin Active Problems:   Syncope and collapse, recurrent   Fall   Orthostatic hypotension   SDH (subdural hematoma) (HCC)   Left arm pain   Acute renal failure superimposed on stage 3a chronic kidney disease (HCC)   Type 1 diabetes mellitus with diabetic neuropathy, unspecified (HCC)   Dyslipidemia   Seizure disorder (HCC)   Anemia due to chronic kidney disease   Paroxysmal atrial fibrillation (HCC)   History of CVA (cerebrovascular accident)   Myocardial injury   Iron  deficiency anemia   Multiple myeloma (HCC)   Cocaine abuse (HCC)   Polysubstance abuse [facility])    Hospital Course:  73 y.o. male with medical history significant of type 1 diabetes, DKA, cocaine abuse, orthostatic hypotension, hyperlipidemia, type 1 diabetes, stroke, BPH, CKD-3, PUD, anemia, multiple sclerosis, recurrent multiple falls, SDH, SAH, who presents with syncope and fall.   Patient was recently hospitalized from 5/30 - 6/3 due to fall which resulted in SDH.  He is Eliquis  is on hold.  Possibly due to orthostatic hypotension.  Patient was started as needed midodrine  and Florinef .   Patient has had four ED visits recently due to multiple falls.  He states that he fainted and fell in his kitchen earlier today.  Denies unilateral numbness or tingling in extremities.  No facial droop or slurred speech.  No new head injury.  He complains of left arm pain around elbow area, which is constant, severe, sharp, nonradiating, aggravated by movement.  Left arm is swollen, no erythema.  Denies chest pain, cough, SOB.  No  nausea, vomiting, diarrhea or abdominal pain.  No symptoms of UTI.  He reports lightheadedness and dizziness intermittently.     Data reviewed independently and ED Course: pt was found to have WBC 4.0, stable hemoglobin, worsening renal function, temperature normal, blood pressure 100/59, heart rate 82, 15, oxygen saturation 95% on room air.  Patient is previously in telemetry bed position.   CT of head: 1. No acute intracranial abnormality. 2. Stable trace low-density chronic left subdural hematoma, without mass effect or midline shift. 3. Atrophy with small vessel ischemic changes.  6/18.  Patient feels okay.  Not wearing sling for his left arm.  Found to be orthostatic with physical therapy team. 6/19.  Hemoglobin down to 7.2, will transfuse 1 unit of packed red blood cells.  Likely had some bleeding into his left arm with the swelling and fracture. 6/20.  Refused labs today on 2 occasions.  Assessment and Plan: * Drop in hemoglobin Hemoglobin dropped down to 7.2.  Likely with IV fluid boluses and also may have had some bleeding in his arm with the fracture.  Patient given 1 unit of packed red blood cells on 6/19.  Patient refused repeat labs today.  Will try again tomorrow.  Syncope and collapse, recurrent Secondary to orthostatic hypotension  Orthostatic hypotension Blood pressure high last night.  Will decrease dose of midodrine  and hold Florinef .  Continue TED hose and abdominal binder.  Left arm pain Left humeral fracture seen on 6/12.  Patient noncompliant with sling.  I placed his arm in a sling today.  Repeated left shoulder x-ray which shows similar humeral fracture as previously seen.  SDH (subdural hematoma) (HCC) Eliquis  on hold.  CT scan shows stable subdural.  Acute renal failure superimposed on stage 3a chronic kidney disease (HCC) Creatinine 1.73 on presentation.  Last creatinine 1.25.  Baseline creatinine 1.31  Dyslipidemia On Lipitor   Type 1 diabetes mellitus  with diabetic neuropathy, unspecified (HCC) Semglee  insulin  and sliding scale insulin  ordered.  Patient uses insulin  pump at home.  Seizure disorder (HCC) On Keppra   Paroxysmal atrial fibrillation (HCC) Not a candidate with Eliquis  with subdural hematomas and falls.  Anemia due to chronic kidney disease Patient transfused 1 unit of packed red blood cells on a hemoglobin of 7.2.  Refused repeat labs         Consultants: None Procedures performed: None Disposition: Rehabilitation facility Diet recommendation:  Cardiac and Carb modified diet DISCHARGE MEDICATION: Allergies as of 09/17/2023       Reactions   Penicillins Other (See Comments)   Childhood allergy -tolerated amoxil  03/2022 Unknown reaction Has patient had a PCN reaction causing immediate rash, facial/tongue/throat swelling, SOB or lightheadedness with hypotension: No Has patient had a PCN reaction causing severe rash involving mucus membranes or skin necrosis: No Has patient had a PCN reaction that required hospitalization No Has patient had a PCN reaction occurring within the last 10 years: No If all of the above answers are NO, then may proceed with Cephalosporin use.        Medication List     STOP taking these medications    apixaban  5 MG Tabs tablet Commonly known as: ELIQUIS    ascorbic acid  500 MG tablet Commonly known as: VITAMIN C    Insulin  Aspart FlexPen 100 UNIT/ML Commonly known as: NOVOLOG  Replaced by: insulin  aspart 100 UNIT/ML injection   insulin  lispro 100 UNIT/ML injection Commonly known as: HUMALOG   multivitamin with minerals Tabs tablet   ondansetron  8 MG tablet Commonly known as: ZOFRAN    pantoprazole  40 MG tablet Commonly known as: PROTONIX    polyethylene glycol 17 g packet Commonly known as: MiraLax    Tresiba  FlexTouch 100 UNIT/ML FlexTouch Pen Generic drug: insulin  degludec       TAKE these medications    acetaminophen  325 MG tablet Commonly known as:  TYLENOL  Take 2 tablets (650 mg total) by mouth every 6 (six) hours as needed for mild pain (pain score 1-3) or fever.   acyclovir  400 MG tablet Commonly known as: ZOVIRAX  Take 1 tablet (400 mg total) by mouth 2 (two) times daily.   atorvastatin  80 MG tablet Commonly known as: LIPITOR  Take 1 tablet (80 mg total) by mouth at bedtime. Hold while taking Paxlovid    bisacodyl  5 MG EC tablet Generic drug: bisacodyl  Take 1 tablet (5 mg total) by mouth at bedtime as needed for mild constipation. Skip the dose if no constipation   Dexcom G7 Sensor Misc 1 each by Does not apply route 4 (four) times daily -  before meals and at bedtime.   fludrocortisone  0.1 MG tablet Commonly known as: FLORINEF  Take 1 tablet (0.1 mg total) by mouth daily as needed. Skip the dose if SBP >130   gabapentin  300 MG capsule Commonly known as: NEURONTIN  Take 1 capsule (300 mg total) by mouth 2 (two) times daily.   insulin  aspart 100 UNIT/ML injection Commonly known as: novoLOG  Inject 1 Units into the skin 3 (three) times daily with meals. If eating and Blood Glucose (BG) 80 or higher inject 1 units for meal coverage and add  correction dose per scale. If not eating, correction dose only. BG <150= 0 unit; BG 150-200= 1 unit; BG 201-250= 2 unit; BG 251-300= 3 unit; BG 301-350= 4 unit; BG 351-400= 5 unit; BG >400= 6 unit and Call Primary Care. Replaces: Insulin  Aspart FlexPen 100 UNIT/ML   insulin  glargine-yfgn 100 UNIT/ML injection Commonly known as: SEMGLEE  Inject 0.04 mLs (4 Units total) into the skin daily. Start taking on: September 18, 2023   iron  polysaccharides 150 MG capsule Commonly known as: NIFEREX Take 1 capsule (150 mg total) by mouth daily.   levETIRAcetam  500 MG tablet Commonly known as: KEPPRA  Take 1 tablet by mouth twice daily   midodrine  2.5 MG tablet Commonly known as: PROAMATINE  Take 2.5 mg by mouth 3 (three) times daily.   oxyCODONE -acetaminophen  5-325 MG tablet Commonly known as:  Percocet Take 1 tablet by mouth every 6 (six) hours as needed for severe pain (pain score 7-10). What changed: when to take this   Pomalyst  2 MG capsule Generic drug: pomalidomide  TAKE 1 CAPSULE BY MOUTH EVERY DAY FOR 21 DAYS THEN HOLD FOR 7 DAYS. REPEAT EVERY 28 DAYS.        Follow-up Information     Monique Ano, MD Follow up.   Specialty: Family Medicine Why: hospital follow up Contact information: [address] [facility] [place] [address] [phone]         Ludwig Safer, PA-C Follow up.   Specialty: Physician Assistant Why: keep appointment on 6/24 at 1:30 pm Contact information: [address] [place], [place] [address] [place] [address] [phone]                Discharge Exam: Cleavon Curls Weights   09/14/23 1643 09/15/23 0154  Weight: 66.1 kg 75.5 kg   Physical Exam HENT:     Head: Normocephalic.     Mouth/Throat:     Pharynx: No oropharyngeal exudate.   Eyes:     General: Lids are normal.     Conjunctiva/sclera: Conjunctivae normal.    Cardiovascular:     Rate and Rhythm: Normal rate and regular rhythm.     Heart sounds: S1 normal and S2 normal. Murmur heard.     Systolic murmur is present with a grade of 2/6.  Pulmonary:     Breath sounds: No decreased breath sounds, wheezing, rhonchi or rales.  Abdominal:     Palpations: Abdomen is soft.     Tenderness: There is no abdominal tenderness.   Musculoskeletal:     Right upper arm: Swelling present.     Right lower leg: No swelling.     Left lower leg: No swelling.   Skin:    General: Skin is warm.     Findings: No rash.   Neurological:     Mental Status: He is alert.      Condition at discharge: stable  The results of significant diagnostics from this hospitalization (including imaging, microbiology, ancillary and laboratory) are listed below for reference.   Imaging Studies: DG Shoulder Left Result Date: 09/15/2023 CLINICAL DATA:  144008 Arm swelling  [id number] EXAM: LEFT SHOULDER - 2+ VIEW COMPARISON:  X-ray left shoulder 09/09/2023 FINDINGS: No dislocation. Similar-appearing acute displaced and comminuted fracture of the left humeral neck. There is no evidence of severe arthropathy or other focal bone abnormality. Subcutaneus soft tissue edema. IMPRESSION: Similar-appearing acute displaced and comminuted fracture of the left humeral neck. Electronically Signed   By: Morgane  Naveau M.D.   On: 09/15/2023 19:28   DG Forearm  Left Result Date: 09/15/2023 EXAM: 3 VIEW(S) XRAY OF THE LEFT FOREARM 09/15/2023 03:01:00 AM COMPARISON: None available. CLINICAL HISTORY: Left arm pain. Patient c/o sharp pain to left arm. No known injury. FINDINGS: BONES AND JOINTS: Suspected old post-traumatic deformity involving the distal radius / radial styloid. No acute fracture. SOFT TISSUES: The soft tissues are unremarkable. IMPRESSION: 1. No acute fracture. Electronically signed by: Zadie Herter MD 09/15/2023 03:04 AM EDT RP Workstation: [id number]   DG Elbow 2 Views Left Result Date: 09/15/2023 EXAM: 2 VIEW(S) XRAY OF THE LEFT ELBOW COMPARISON: None available. CLINICAL HISTORY: Left arm pain. Patient c/o sharp pain to left arm. No known injury. FINDINGS: BONES AND JOINTS: No acute fracture. No focal osseous lesion. No joint dislocation. SOFT TISSUES: Moderate soft tissue swelling overlying the dorsal elbow / olecranon, raising the possibility of olecranon bursitis. IMPRESSION: 1. Moderate soft tissue swelling overlying the dorsal elbow/olecranon, raising the possibility of olecranon bursitis. Electronically signed by: Zadie Herter MD 09/15/2023 03:03 AM EDT RP Workstation: [id number]   CT Head Wo Contrast Result Date: 09/14/2023 EXAM: CT HEAD WITHOUT CONTRAST 09/14/2023 08:01:25 PM TECHNIQUE: CT of the head was performed without the administration of intravenous contrast. Automated exposure control, iterative reconstruction, and/or weight based adjustment of the mA/kV  was utilized to reduce the radiation dose to as low as reasonably achievable. COMPARISON: 09/13/2023 CLINICAL HISTORY: Fall. Pt to ED via [facility]. Pt was going to [facility] for follow up visit when staff brought him to ED due to confusion and diaphoresis. On arrival pt alert and diaphoretic. Pt A\T\Ox3. Pt with partner who states pt has been more confused today with decreased appetite. Pt ; hx of DM. CBG 203 on arrival. FINDINGS: BRAIN AND VENTRICLES: Global cortical atrophy. Subcortical and periventricular small vessel ischemic changes. Stable trace low-density chronic left subdural hematoma, measuring approximately 2 mm (image 37), without mass effect or midline shift. ORBITS: The visualized portion of the orbits demonstrate no acute abnormality. SINUSES: The visualized paranasal sinuses and mastoid air cells demonstrate no acute abnormality. SOFT TISSUES AND SKULL: No acute abnormality of the visualized skull or soft tissues. IMPRESSION: 1. No acute intracranial abnormality. 2. Stable trace low-density chronic left subdural hematoma, without mass effect or midline shift. 3. Atrophy with small vessel ischemic changes. Electronically signed by: Zadie Herter MD 09/14/2023 08:22 PM EDT RP Workstation: [id number]   CT HEAD WO CONTRAST ( ) Result Date: 09/14/2023 EXAM: CT HEAD WITHOUT CONTRAST 09/13/2023 03:11:58 PM TECHNIQUE: CT of the head was performed without the administration of intravenous contrast. Automated exposure control, iterative reconstruction, and/or weight based adjustment of the mA/kV was utilized to reduce the radiation dose to as low as reasonably achievable. COMPARISON: 09/11/2023 CLINICAL HISTORY: Subdural hematoma. F/u subdural hematoma, last CT Head obtained 09/11/23 while in ED; Pt reporting severe left arm pain with swelling at elbow, states he doesn't know why. FINDINGS: BRAIN AND VENTRICLES: Global cortical atrophy. Subcortical and periventricular small vessel ischemic changes. Suspected trace  low-density chronic left subdural hematoma, measuring approximately 2 mm (image 42), without mass effect or midline shift. ORBITS: The visualized portion of the orbits demonstrate no acute abnormality. SINUSES: The visualized paranasal sinuses and mastoid air cells demonstrate no acute abnormality. SOFT TISSUES AND SKULL: No acute abnormality of the visualized skull or soft tissues. IMPRESSION: 1. Suspected trace low-density chronic left subdural hematoma, without mass effect or midline shift. 2. Atrophy with small vessel ischemic changes. Electronically signed by: Zadie Herter MD 09/14/2023 08:21 PM EDT RP Workstation: [id number]   CT HEAD  WO CONTRAST ( ) Result Date: 09/11/2023 EXAM: CT HEAD WITHOUT CONTRAST 09/11/2023 07:13:01 PM TECHNIQUE: CT of the head was performed without the administration of intravenous contrast. Automated exposure control, iterative reconstruction, and/or weight based adjustment of the mA/kV was utilized to reduce the radiation dose to as low as reasonably achievable. COMPARISON: 08/27/2023 CLINICAL HISTORY: Head trauma, minor (Age >= 65y). Per ed notes; Per family patient has had multiple falls today and has a change in mental status; history of orthostatic positive. FINDINGS: BRAIN AND VENTRICLES: There is no acute intracranial hemorrhage, mass effect or midline shift. No abnormal extra-axial fluid collection. The gray-white differentiation is maintained without an acute infarct. There is no hydrocephalus. Complete / near complete resolution of prior left subdural hematoma (coronal image 43), although poorly evaluated. Suspected complete resolution of prior right subdural hematoma, although poorly evaluated. ORBITS: The visualized portion of the orbits demonstrate no acute abnormality. SINUSES: The visualized paranasal sinuses and mastoid air cells demonstrate no acute abnormality. SOFT TISSUES AND SKULL: No acute abnormality of the visualized skull or soft tissues. IMPRESSION:  1. Motion degraded images, limiting evaluation. 2. Complete/near complete resolution of prior thin subdural hematomas, although poorly evaluated. 3. No acute intracranial abnormality. Electronically signed by: Zadie Herter MD 09/11/2023 07:35 PM EDT RP Workstation: [id number]   DG Shoulder Left Result Date: 09/09/2023 CLINICAL DATA:  Status post fall onto the left shoulder EXAM: LEFT SHOULDER - 3 VIEW COMPARISON:  None Available. FINDINGS: Comminuted fracture of the left humeral neck with 1 cortical width medial displacement. No acute dislocation of the glenohumeral joint. Degenerative changes of the shoulder. IMPRESSION: Minimally displaced, comminuted fracture of the left humeral neck. Electronically Signed   By: Limin  Xu M.D.   On: 09/09/2023 13:30   DG Abd 1 View Result Date: 08/30/2023 CLINICAL DATA:  Constipation EXAM: ABDOMEN - 1 VIEW COMPARISON:  None Available. FINDINGS: Normal colonic stool burden. Nonobstructive bowel gas pattern. Visualized osseous structures are within normal limits. IMPRESSION: Negative.  Normal colonic stool burden. Electronically Signed   By: Zadie Herter M.D.   On: 08/30/2023 21:06   EEG adult Result Date: 08/27/2023 Arleene Lack, MD     08/27/2023  4:32 PM Patient Name: Nicholas Weiss MRN: [id number] Epilepsy Attending: Arleene Lack Referring Physician/Provider: [place], MD Date: 08/27/2023 Duration: 31.23 mins Patient history: 73yo M with h/o seizures came with syncope. EEG to evaluate for seizure Level of alertness: Awake, asleep AEDs during EEG study: LEV, GBP Technical aspects: This EEG study was done with scalp electrodes positioned according to the 10-20 International system of electrode placement. Electrical activity was reviewed with band pass filter of 1-70Hz , sensitivity of 7 uV/mm, display speed of 21mm/sec with a 60Hz  notched filter applied as appropriate. EEG data were recorded continuously and digitally stored.  Video monitoring was  available and reviewed as appropriate. Description: The posterior dominant rhythm consists of 7.5 Hz activity of moderate voltage (25-35 uV) seen predominantly in posterior head regions, symmetric and reactive to eye opening and eye closing. Sleep was characterized by sleep spindles (12 to 14 Hz), maximal frontocentral region. EEG showed continuous generalized 5 to 7 Hz theta slowing admixed with intermittent generalized 2-3 Hz delta slowing, at times with triphasic morphology. Hyperventilation and photic stimulation were not performed.    ABNORMALITY - Continuous slow, generalized  IMPRESSION: This study is suggestive of moderate diffuse encephalopathy. No seizures or epileptiform discharges were seen throughout the recording.  Priyanka O Yadav    CT HEAD WO  CONTRAST ( ) Result Date: 08/27/2023 CLINICAL DATA:  73 year old male status post fall with hypodense left subdural hematoma. EXAM: CT HEAD WITHOUT CONTRAST TECHNIQUE: Contiguous axial images were obtained from the base of the skull through the vertex without intravenous contrast. RADIATION DOSE REDUCTION: This exam was performed according to the departmental dose-optimization program which includes automated exposure control, adjustment of the mA and/or kV according to patient size and/or use of iterative reconstruction technique. COMPARISON:  Head CT 0015 hours today. FINDINGS: Brain: Redemonstrated left side subdural hematoma, hyperdense blood products noted along the left frontal operculum level on coronal image 34. Left SDH thickness is 4-5 mm and not significantly changed. Smaller contralateral and mostly superior convexity low-density right side subdural hematoma also noted, increased in conspicuity but small measuring 2-3 mm (coronal image 45). Also there is hyperdense para falcine subdural blood, small volume (coronal image 34) up to 3 mm and stable superimposed on dural calcification. Trace rightward midline shift (series 6, image 15). Ventricle  size and configuration remains normal. No IVH. No subarachnoid or intra-axial blood. Stable gray-white matter differentiation throughout the brain. No cortically based acute infarct identified. Vascular: Calcified atherosclerosis at the skull base. No suspicious intracranial vascular hyperdensity. Skull: Stable.  No skull fracture identified. Sinuses/Orbits: Visualized paranasal sinuses and mastoids are stable and well aerated. Other: Leftward gaze now. No acute orbit or scalp soft tissue injury is identified. IMPRESSION: 1. Positive for small Bilateral Subdural Hematomas: - right side mixed density SDH is 4-5 mm thickness and not significantly changed from 0015 hours. - left SDH is low-density and smaller, 2-3 mm, more conspicuous since the prior exam. - small hyperdense right parafalcine SDH is stable, 3 mm. 2. No skull fracture identified. No significant intracranial mass effect at this time, trace rightward midline shift. Electronically Signed   By: Marlise Simpers M.D.   On: 08/27/2023 06:19   CT Cervical Spine Wo Contrast Result Date: 08/27/2023 CLINICAL DATA:  Fall.  Neck trauma (Age >= 65y) EXAM: CT CERVICAL SPINE WITHOUT CONTRAST TECHNIQUE: Multidetector CT imaging of the cervical spine was performed without intravenous contrast. Multiplanar CT image reconstructions were also generated. RADIATION DOSE REDUCTION: This exam was performed according to the departmental dose-optimization program which includes automated exposure control, adjustment of the mA and/or kV according to patient size and/or use of iterative reconstruction technique. COMPARISON:  07/21/2023 FINDINGS: Alignment: Normal Skull base and vertebrae: No acute fracture. No primary bone lesion or focal pathologic process. Soft tissues and spinal canal: No prevertebral fluid or swelling. No visible canal hematoma. Disc levels: Degenerative disc disease changes at C5-6 and C6-7 with disc space narrowing and spurring. Moderate degenerative facet  disease, left greater than right. Upper chest: No acute findings Other: None IMPRESSION: Degenerative changes.  No acute bony abnormality. Electronically Signed   By: Janeece Mechanic M.D.   On: 08/27/2023 00:29   CT HEAD WO CONTRAST ( ) Result Date: 08/27/2023 CLINICAL DATA:  Head trauma, minor (Age >= 65y).  Fall. EXAM: CT HEAD WITHOUT CONTRAST TECHNIQUE: Contiguous axial images were obtained from the base of the skull through the vertex without intravenous contrast. RADIATION DOSE REDUCTION: This exam was performed according to the departmental dose-optimization program which includes automated exposure control, adjustment of the mA and/or kV according to patient size and/or use of iterative reconstruction technique. COMPARISON:  07/21/2023 FINDINGS: Brain: There is a low-density to isodense subdural fluid collection noted over the left cerebral convexity, new since prior study concerning for isodense subdural hematoma. This measures approximately 5  mm in thickness on coronal image 48, series 4. No mass effect or midline shift. No hydrocephalus. No intracerebral hemorrhage or acute infarction. Again noted are extensive calcifications along the falx and tentorium. Vascular: No hyperdense vessel or unexpected calcification. Skull: No acute calvarial abnormality. Sinuses/Orbits: No acute findings Other: None IMPRESSION: Low-density to isodense subdural fluid collection measuring 5 mm in thickness over the left cerebral convexity concerning for small subdural hematoma. This is age indeterminate but new since prior study suggesting acute or subacute. No mass effect or midline shift. Electronically Signed   By: Janeece Mechanic M.D.   On: 08/27/2023 00:27    Microbiology: Results for orders placed or performed during the hospital encounter of 09/11/23  Resp panel by RT-PCR (RSV, Flu A&B, Covid) Anterior Nasal Swab     Status: None   Collection Time: 09/11/23  7:37 PM   Specimen: Anterior Nasal Swab  Result Value  Ref Range Status   SARS Coronavirus 2 by RT PCR NEGATIVE NEGATIVE Final    Comment: (NOTE) SARS-CoV-2 target nucleic acids are NOT DETECTED.  The SARS-CoV-2 RNA is generally detectable in upper respiratory specimens during the acute phase of infection. The lowest concentration of SARS-CoV-2 viral copies this assay can detect is 138 copies/mL. A negative result does not preclude SARS-Cov-2 infection and should not be used as the sole basis for treatment or other patient management decisions. A negative result may occur with  improper specimen collection/handling, submission of specimen other than nasopharyngeal swab, presence of viral mutation(s) within the areas targeted by this assay, and inadequate number of viral copies(<138 copies/mL). A negative result must be combined with clinical observations, patient history, and epidemiological information. The expected result is Negative.  Fact Sheet for Patients:  BloggerCourse.com  Fact Sheet for Healthcare Providers:  SeriousBroker.it  This test is no t yet approved or cleared by the [place]  FDA and  has been authorized for detection and/or diagnosis of SARS-CoV-2 by FDA under an Emergency Use Authorization (EUA). This EUA will remain  in effect (meaning this test can be used) for the duration of the COVID-19 declaration under Section 564(b)(1) of the Act, 21 U.S.C.section 360bbb-3(b)(1), unless the authorization is terminated  or revoked sooner.       Influenza A by PCR NEGATIVE NEGATIVE Final   Influenza B by PCR NEGATIVE NEGATIVE Final    Comment: (NOTE) The Xpert Xpress SARS-CoV-2/FLU/RSV plus assay is intended as an aid in the diagnosis of influenza from Nasopharyngeal swab specimens and should not be used as a sole basis for treatment. Nasal washings and aspirates are unacceptable for Xpert Xpress SARS-CoV-2/FLU/RSV testing.  Fact Sheet for  Patients: BloggerCourse.com  Fact Sheet for Healthcare Providers: SeriousBroker.it  This test is not yet approved or cleared by the [place]  FDA and has been authorized for detection and/or diagnosis of SARS-CoV-2 by FDA under an Emergency Use Authorization (EUA). This EUA will remain in effect (meaning this test can be used) for the duration of the COVID-19 declaration under Section 564(b)(1) of the Act, 21 U.S.C. section 360bbb-3(b)(1), unless the authorization is terminated or revoked.     Resp Syncytial Virus by PCR NEGATIVE NEGATIVE Final    Comment: (NOTE) Fact Sheet for Patients: BloggerCourse.com  Fact Sheet for Healthcare Providers: SeriousBroker.it  This test is not yet approved or cleared by the [place]  FDA and has been authorized for detection and/or diagnosis of SARS-CoV-2 by FDA under an Emergency Use Authorization (EUA). This EUA will remain in effect (meaning this  test can be used) for the duration of the COVID-19 declaration under Section 564(b)(1) of the Act, 21 U.S.C. section 360bbb-3(b)(1), unless the authorization is terminated or revoked.  Performed at [facility], [address] [place], [place] [address]    *Note: Due to a large number of results and/or encounters for the requested time period, some results have not been displayed. A complete set of results can be found in Results Review.    Labs: CBC: Recent Labs  Lab 09/11/23 1930 09/13/23 1931 09/14/23 1759 09/15/23 0600 09/16/23 0444  WBC 3.5* 3.8* 4.0 4.0 3.6*  NEUTROABS  --  2.2  --   --   --   HGB 8.1* 8.4* 8.8* 8.1* 7.2*  HCT 25.1* 26.5* 27.9* 25.5* 22.4*  MCV 96.2 96.4 95.9 94.8 95.3  PLT 209 260 299 288 299   Basic Metabolic Panel: Recent Labs  Lab 09/11/23 1930 09/13/23 1931 09/14/23 1759 09/15/23 0600 09/16/23 0444  NA 136 141 139 138 137  K 4.0 4.3  4.1 4.9 4.1  CL 104 108 106 106 109  CO2 23 23 21* 21* 21*  GLUCOSE 361* 103* 213* 291* 195*  BUN 36* 27* 39* 42* 36*  CREATININE 1.54* 1.31* 1.73* 1.64* 1.25*  CALCIUM  8.6* 8.6* 8.6* 8.0* 8.0*   Liver Function Tests: Recent Labs  Lab 09/11/23 1930 09/14/23 1759  AST 24 17  ALT 29 24  ALKPHOS 63 64  BILITOT 0.6 0.9  PROT 7.9 7.4  ALBUMIN 3.2* 3.0*   CBG: Recent Labs  Lab 09/16/23 1652 09/16/23 2059 09/17/23 0324 09/17/23 0729 09/17/23 1213  GLUCAP 76 119* 93 107* 189*    Discharge time spent: greater than 30 minutes.  Signed: Verla Glaze, MD [facility] 09/17/2023

## 2023-09-17 NOTE — Progress Notes (Signed)
 Physical Therapy Treatment Patient Details Name: Nicholas Weiss MRN: [id number] DOB: 07-Aug-1950 Today's Date: 09/17/2023   History of Present Illness Nicholas Weiss is a 73 y.o. male with medical history significant of type 1 diabetes, DKA, cocaine abuse, orthostatic hypotension, hyperlipidemia, type 1 diabetes, stroke, BPH, CKD-3, PUD, anemia, multiple sclerosis, recurrent multiple falls, SDH, SAH, who presents with syncope and fall.    PT Comments  Pt asleep in room, blanket over head, arouses to voice easily, eventually makes an appearance. Author takes time to tidy up thigh high stockings and Left shoulder sling. Pt remains fairly somnolent and incoherence in responses at times despite eyes open. Pt requires total A for all mobility today. Assisted pt back into bed and elevated tall so he can navigate he recently received lunch tray. Author takes time to set up all items that require bimanual setup, however he declines salad dressing, splenda, etc. RN made aware of sling being far too small, asked for a new larger sling. Left shoulder pain remains at goal at this time, pt actually Weiss/o pain in left leg moreso. Will continue to follow.     If plan is discharge home, recommend the following: A little help with walking and/or transfers;A little help with bathing/dressing/bathroom;Assistance with cooking/housework;Assist for transportation;Help with stairs or ramp for entrance   Can travel by private vehicle     No  Equipment Recommendations  None recommended by PT    Recommendations for Other Services       Precautions / Restrictions Precautions Precautions: Fall Precaution/Restrictions Comments: Orthostatic hypotension. Ted hose/abd binder, left shoulder sling Required Braces or Orthoses: Sling Restrictions LUE Weight Bearing Per Provider Order: Non weight bearing     Mobility  Bed Mobility Overal bed mobility: Needs Assistance Bed Mobility: Supine to Sit, Sit to Supine      Supine to sit: Max assist Sit to supine: Max assist   General bed mobility comments: heavily limited by AMS and somnolence; maintains balance at EOB fairly well.    Transfers Overall transfer level: Needs assistance   Transfers: Bed to chair/wheelchair/BSC Sit to Stand:  (pt refuses to attempt (AMS, somnolence))          Lateral/Scoot Transfers: Max assist, +2 physical assistance (pt maintains trunk upright balance, that's about it.)      Ambulation/Gait             Pre-gait activities: Bolivar Bushman assists with correct donning of TED hose bilat (thigh high) and sling, did not take time for ABD binder due to poor EOB tolerance and clearly not progressing to transfer training today due to somnolence and AMS     Stairs             Wheelchair Mobility     Tilt Bed    Modified Rankin (Stroke Patients Only)       Balance                                            Communication    Cognition Arousal: Alert Behavior During Therapy: WFL for tasks assessed/performed, Flat affect   PT - Cognitive impairments: History of cognitive impairments                       PT - Cognition Comments: decreased safety awareness. safety cues and education provided for fall prevention and safety with mobility  Cueing    Exercises      General Comments        Pertinent Vitals/Pain Pain Assessment Pain Assessment: Faces Faces Pain Scale: Hurts even more Pain Location: Weiss/o LLE pain more durign mobility, tolerates micro adjustments of LUE for sling adjustment Pain Intervention(s): Limited activity within patient's tolerance, Monitored during session    Home Living                          Prior Function            PT Goals (current goals can now be found in the care plan section) Acute Rehab PT Goals PT Goal Formulation: Patient unable to participate in goal setting    Frequency    Min 2X/week      PT Plan       Co-evaluation              AM-PAC PT 6 Clicks Mobility   Outcome Measure  Help needed turning from your back to your side while in a flat bed without using bedrails?: A Lot Help needed moving from lying on your back to sitting on the side of a flat bed without using bedrails?: A Lot Help needed moving to and from a bed to a chair (including a wheelchair)?: Total Help needed standing up from a chair using your arms (e.g., wheelchair or bedside chair)?: A Lot Help needed to walk in hospital room?: Total Help needed climbing 3-5 steps with a railing? : Total 6 Click Score: 9    End of Session   Activity Tolerance: Other (comment);Patient limited by fatigue (AMS) Patient left: in bed;with call bell/phone within reach;with bed alarm set;with family/visitor present Nurse Communication: Mobility status PT Visit Diagnosis: Unsteadiness on feet (R26.81);Repeated falls (R29.6);Muscle weakness (generalized) (M62.81);Difficulty in walking, not elsewhere classified (R26.2);Pain Pain - Right/Left: Left Pain - part of body: Shoulder     Time: 5284-1324 PT Time Calculation (min) (ACUTE ONLY): 23 min  Charges:    $Therapeutic Activity: 8-22 mins PT General Charges $$ ACUTE PT VISIT: 1 Visit                    12:41 PM, 09/17/23 Dawn Eth, PT, DPT Physical Therapist - [facility]  [phone] (ASCOM)     Nicholas Weiss 09/17/2023, 12:37 PM

## 2023-09-17 NOTE — Progress Notes (Unsigned)
 Referring Physician:  No referring provider defined for this encounter.  Primary Physician:  Nicholas Amis, MD  History of Present Illness: 09/21/23 [name] is here today for follow-up of subdural hematoma but on head CT approximately 2 weeks ago after suffering a fall.  He does have a history of multiple myeloma and potential dementia at baseline.  His partner is here with him.  He is unable to state if he is having any headaches.  His largest complaint right now is his left arm in which he has a known fracture but is not had follow-up at this time.  Review of Systems:  A 10 point review of systems is negative, except for the pertinent positives and negatives detailed in the HPI.  Past Medical History: Past Medical History:  Diagnosis Date   Acute metabolic encephalopathy 12/02/2020   DKA (diabetic ketoacidoses) 04/06/2016   Hypercholesteremia    Hypertension     Past Surgical History: Past Surgical History:  Procedure Laterality Date   AMPUTATION TOE Left 04/25/2022   Procedure: LEFT GREAT TOE AMPUTATION AND LEFT PARTIAL 2ND TOE AMPUTATION;  Surgeon: Janit Thresa HERO, DPM;  Location: [facility] ORS;  Service: Podiatry;  Laterality: Left;   COLONOSCOPY WITH PROPOFOL  N/A 02/01/2020   Procedure: COLONOSCOPY WITH PROPOFOL ;  Surgeon: Unk Corinn Skiff, MD;  Location: [facility] ENDOSCOPY;  Service: Gastroenterology;  Laterality: N/A;   ESOPHAGOGASTRODUODENOSCOPY  02/01/2020   Procedure: ESOPHAGOGASTRODUODENOSCOPY (EGD);  Surgeon: Unk Corinn Skiff, MD;  Location: [facility] ENDOSCOPY;  Service: Gastroenterology;;   ESOPHAGOGASTRODUODENOSCOPY (EGD) WITH PROPOFOL  N/A 06/07/2022   Procedure: ESOPHAGOGASTRODUODENOSCOPY (EGD) WITH PROPOFOL ;  Surgeon: Saintclair Jasper, MD;  Location: [facility] ENDOSCOPY;  Service: Gastroenterology;  Laterality: N/A;   KNEE SURGERY Right    Torn meniscus   KNEE SURGERY Left     Allergies: Allergies as of 09/21/2023 - Review Complete 09/21/2023  Allergen Reaction  Noted   Penicillins Other (See Comments) 10/16/2014    Medications: Outpatient Encounter Medications as of 09/21/2023  Medication Sig   acetaminophen  (TYLENOL ) 325 MG tablet Take 2 tablets (650 mg total) by mouth every 6 (six) hours as needed for mild pain (pain score 1-3) or fever.   acyclovir  (ZOVIRAX ) 400 MG tablet Take 1 tablet (400 mg total) by mouth 2 (two) times daily.   atorvastatin  (LIPITOR ) 80 MG tablet Take 1 tablet (80 mg total) by mouth at bedtime. Hold while taking Paxlovid    bisacodyl  5 MG EC tablet Take 1 tablet (5 mg total) by mouth at bedtime as needed for mild constipation. Skip the dose if no constipation   Continuous Glucose Sensor (DEXCOM G7 SENSOR) MISC 1 each by Does not apply route 4 (four) times daily -  before meals and at bedtime.   fludrocortisone  (FLORINEF ) 0.1 MG tablet Take 1 tablet (0.1 mg total) by mouth daily as needed. Skip the dose if SBP >130   gabapentin  (NEURONTIN ) 300 MG capsule Take 1 capsule (300 mg total) by mouth 2 (two) times daily.   insulin  aspart (NOVOLOG ) 100 UNIT/ML injection Inject 1 Units into the skin 3 (three) times daily with meals. If eating and Blood Glucose (BG) 80 or higher inject 1 units for meal coverage and add correction dose per scale. If not eating, correction dose only. BG <150= 0 unit; BG 150-200= 1 unit; BG 201-250= 2 unit; BG 251-300= 3 unit; BG 301-350= 4 unit; BG 351-400= 5 unit; BG >400= 6 unit and Call Primary Care.   insulin  glargine-yfgn (SEMGLEE ) 100 UNIT/ML injection Inject 0.04 mLs (4  Units total) into the skin daily.   iron  polysaccharides (NIFEREX) 150 MG capsule Take 1 capsule (150 mg total) by mouth daily.   levETIRAcetam  (KEPPRA ) 500 MG tablet Take 1 tablet by mouth twice daily   midodrine  (PROAMATINE ) 2.5 MG tablet Take 2.5 mg by mouth 3 (three) times daily.   oxyCODONE -acetaminophen  (PERCOCET) 5-325 MG tablet Take 1 tablet by mouth every 6 (six) hours as needed for severe pain (pain score 7-10).   [DISCONTINUED]  apixaban  (ELIQUIS ) 5 MG TABS tablet Take 1 tablet (5 mg total) by mouth 2 (two) times daily. Start Eliquis  after 2 weeks which will be 03/02/2023   [DISCONTINUED] ascorbic acid  (VITAMIN C ) 500 MG tablet Take 1 tablet (500 mg total) by mouth daily. (Patient not taking: Reported on 09/14/2023)   [DISCONTINUED] bisacodyl  5 MG EC tablet Take 1 tablet (5 mg total) by mouth at bedtime. Skip the dose if no constipation (Patient taking differently: Take 5 mg by mouth at bedtime as needed for mild constipation. Skip the dose if no constipation)   [DISCONTINUED] fludrocortisone  (FLORINEF ) 0.1 MG tablet Take 1 tablet (0.1 mg total) by mouth daily. Skip the dose if SBP >130 (Patient taking differently: Take 0.1 mg by mouth daily as needed. Skip the dose if SBP >130)   [DISCONTINUED] gabapentin  (NEURONTIN ) 300 MG capsule Take 300 mg by mouth 2 (two) times daily.   [DISCONTINUED] Insulin  Aspart FlexPen (NOVOLOG ) 100 UNIT/ML Sliding Scale: Glucose 70 - 120: 0 units Glucose 121 - 150: 1 unit Glucose 151 - 200: 2 units Glucose 201 - 250: 3 units Glucose 251 - 300: 5 units Glucose 301 - 350: 7 units Glucose 351 - 400: 9 units Glucose > 400: call your docotr (Patient not taking: Reported on 09/15/2023)   [DISCONTINUED] insulin  degludec (TRESIBA  FLEXTOUCH) 100 UNIT/ML FlexTouch Pen Inject 5 Units into the skin daily. Home med. (Patient not taking: Reported on 09/15/2023)   [DISCONTINUED] insulin  lispro (HUMALOG) 100 UNIT/ML injection Inject 14 Units into the skin daily. Via Insulin  Pump   [DISCONTINUED] Multiple Vitamin (MULTIVITAMIN WITH MINERALS) TABS tablet Take 1 tablet by mouth daily. (Patient not taking: Reported on 09/15/2023)   [DISCONTINUED] ondansetron  (ZOFRAN ) 8 MG tablet Take 8 mg by mouth daily as needed for nausea or vomiting. (Patient not taking: Reported on 09/15/2023)   [DISCONTINUED] oxyCODONE -acetaminophen  (PERCOCET) 5-325 MG tablet Take 1 tablet by mouth every 4 (four) hours as needed for severe pain (pain score  7-10).   [DISCONTINUED] pantoprazole  (PROTONIX ) 40 MG tablet Take 40 mg by mouth daily. (Patient not taking: Reported on 09/15/2023)   [DISCONTINUED] polyethylene glycol (MIRALAX ) 17 g packet Take 17 g by mouth 2 (two) times daily. Skip the dose if no constipation (Patient not taking: Reported on 09/15/2023)   [DISCONTINUED] POMALYST  2 MG capsule TAKE 1 CAPSULE BY MOUTH EVERY DAY FOR 21 DAYS THEN HOLD FOR 7 DAYS. REPEAT EVERY 28 DAYS.   Facility-Administered Encounter Medications as of 09/21/2023  Medication   epoetin  alfa-epbx (RETACRIT ) injection 40,000 Units   [DISCONTINUED] acetaminophen  (TYLENOL ) tablet 650 mg   [DISCONTINUED] acyclovir  (ZOVIRAX ) 200 MG capsule 400 mg   [DISCONTINUED] atorvastatin  (LIPITOR ) tablet 80 mg   [DISCONTINUED] insulin  aspart (novoLOG ) injection 0-5 Units   [DISCONTINUED] insulin  aspart (novoLOG ) injection 0-9 Units   [DISCONTINUED] insulin  glargine-yfgn (SEMGLEE ) injection 4 Units   [DISCONTINUED] iron  polysaccharides (NIFEREX) capsule 150 mg   [DISCONTINUED] levETIRAcetam  (KEPPRA ) tablet 500 mg   [DISCONTINUED] LORazepam  (ATIVAN ) injection 2 mg   [DISCONTINUED] midodrine  (PROAMATINE ) tablet 2.5 mg   [  DISCONTINUED] ondansetron  (ZOFRAN ) injection 4 mg   [DISCONTINUED] oxyCODONE -acetaminophen  (PERCOCET/ROXICET) 5-325 MG per tablet 1 tablet    Social History: Social History   Tobacco Use   Smoking status: Never   Smokeless tobacco: Never  Vaping Use   Vaping status: Never Used  Substance Use Topics   Alcohol use: Not Currently    Alcohol/week: 0.0 - 1.0 standard drinks of alcohol    Comment: once every 2 months   Drug use: Yes    Types: Marijuana, Crack cocaine    Comment: last week    Family Medical History: Family History  Problem Relation Age of Onset   Heart attack Father    Hypertension Sister    Cancer Sister     Physical Examination: @VITALWITHPAIN @  General: Patient is lethargic  Psychiatric: Patient is  non-anxious.  Head:  Pupils equal, round, and reactive to light.  ENT:  Oral mucosa appears well hydrated.  Neck:   Supple.  Full range of motion.  Respiratory: Patient is breathing without any difficulty.  Extremities: No edema.  Vascular: Palpable dorsal pedal pulses.  Skin:   On exposed skin, there are no abnormal skin lesions.  NEUROLOGICAL:     Patient is somewhat lethargic, but is oriented to time and place.  Cranial Nerves: Pupils equal round and reactive to light.  Facial tone is symmetric.  Pronator drift test on the right arm looks okay.    Strength: Left upper extremity deferred secondary to proximal humerus fracture.  Patient is wearing poor fitting sling and quite a bit of pain.  Right arm strength remains to baseline.  Medical Decision Making  Imaging: EXAM: CT HEAD WITHOUT CONTRAST 09/14/2023 08:01:25 PM   TECHNIQUE: CT of the head was performed without the administration of intravenous contrast. Automated exposure control, iterative reconstruction, and/or weight based adjustment of the mA/kV was utilized to reduce the radiation dose to as low as reasonably achievable.   COMPARISON: 09/13/2023   CLINICAL HISTORY: Fall. Pt to ED via [facility]. Pt was going to [facility] for follow up visit when staff brought him to ED due to confusion and diaphoresis. On arrival pt alert and diaphoretic. Pt A\T\Ox3. Pt with partner who states pt has been more confused today with decreased appetite. Pt ; hx of DM. CBG 203 on arrival.   FINDINGS:   BRAIN AND VENTRICLES: Global cortical atrophy. Subcortical and periventricular small vessel ischemic changes. Stable trace low-density chronic left subdural hematoma, measuring approximately 2 mm (image 37), without mass effect or midline shift.   ORBITS: The visualized portion of the orbits demonstrate no acute abnormality.   SINUSES: The visualized paranasal sinuses and mastoid air cells demonstrate no acute abnormality.   SOFT  TISSUES AND SKULL: No acute abnormality of the visualized skull or soft tissues.   IMPRESSION: 1. No acute intracranial abnormality. 2. Stable trace low-density chronic left subdural hematoma, without mass effect or midline shift. 3. Atrophy with small vessel ischemic changes.  I have personally reviewed the images and agree with the above interpretation.  Assessment and Plan: [name] is a pleasant 73 y.o. male with resolving subdural hematoma and left proximal humerus fracture.  Plan includes the following:  - Chronic subdural hematoma is improving and without mass effect.  No need for scheduled follow-up however red flag symptoms were reviewed at length we are happy to see him if he has any concerns in the future.  -Patient is having severe pain in his left humerus secondary to his fracture.  Unfortunately there  is no follow-up scheduled for this patient.  Will reach out to orthopedic surgery and arrange this to soon as possible.    Thank you for involving me in the care of this patient.   I spent a total of 45 minutes in both face-to-face and non-face-to-face activities for this visit on the date of this encounter including repairing to the patient, obtaining reviewing separately obtained history, performing medically appropriate examination, counseling the patient, documenting clinical remission, independently interpreting results.  Lyle Decamp, PA-C Dept. of Neurosurgery

## 2023-09-17 NOTE — TOC Initial Note (Addendum)
 Transition of Care [facility]) - Initial/Assessment Note    Patient Details  Name: Nicholas Weiss MRN: [id number] Date of Birth: 09/06/1950  Transition of Care [facility]) CM/SW Contact:    09/17/2023, 1:04 PM  Clinical Narrative:                  CM call to patient's significant other, [name], phone: [phone] regarding screening assessment. Per patient's significant other patient admitted from home. DME is walker, rolling walker and cane.   CM and patient significant other discussed SNF recommendations. Per patient's significant other, is agreeable to SNF and requests no [facility]. CM will perform SNF workup.  CM call to patient's significant other, [name] regarding accepting SNF offer. Per patient's significant other, is agreeable to [facility]. CM alert to [employer], CMA and Leoma Raja, CMA regarding initiating auth.   CM call to Antony Baumgartner, Admissions, [employer] from [place], [place] [facility] is  SNF pending auth ID #[id number] .  Per [place], SNF just called, will accept on Saturday if auth is approved and discharge summary faxed to SNF by 1300. CM alert to Dr. Clelia Current.  Expected Discharge Plan: Skilled Nursing Facility Barriers to Discharge: Continued Medical Work up   Patient Goals and CMS Choice    SNF   Expected Discharge Plan and Services    SNF   Living arrangements for the past 2 months: Single Family Home    Prior Living Arrangements/Services Living arrangements for the past 2 months: Single Family Home Lives with:: Significant Other          Need for Family Participation in Patient Care: Yes (Comment) Care giver support system in place?: Yes (comment) Current home services: DME (RW,CANE, WHEELCHAIR) Criminal Activity/Legal Involvement Pertinent to Current Situation/Hospitalization: No - Comment as needed  Activities of Daily Living   ADL Screening (condition at time of admission) Independently performs ADLs?: No Does the patient have a NEW  difficulty with bathing/dressing/toileting/self-feeding that is expected to last >3 days?: Yes (Initiates electronic notice to provider for possible OT consult) Does the patient have a NEW difficulty with getting in/out of bed, walking, or climbing stairs that is expected to last >3 days?: Yes (Initiates electronic notice to provider for possible PT consult) Does the patient have a NEW difficulty with communication that is expected to last >3 days?: No Is the patient deaf or have difficulty hearing?: No Does the patient have difficulty seeing, even when wearing glasses/contacts?: No Does the patient have difficulty concentrating, remembering, or making decisions?: No  Permission Sought/Granted Permission sought to share information with : Case Manager, Family Supports    Share Information with NAME: Kenry Daubert     Permission granted to share info w Relationship: Significant Other/Son     Emotional Assessment      Unable to assess   Admission diagnosis:  Dehydration [E86.0] Syncope [R55] Weakness [R53.1] Drop in hemoglobin [R71.0] Patient Active Problem List   Diagnosis Date Noted   Drop in hemoglobin 09/16/2023   Left arm pain 09/15/2023   Fall 09/15/2023   Polysubstance abuse (HCC) 09/15/2023   Myocardial injury 09/15/2023   Subdural bleeding (HCC) 08/27/2023   SDH (subdural hematoma) (HCC) 08/27/2023   MCI (mild cognitive impairment) with memory loss 08/18/2023   DKA (diabetic ketoacidosis) (HCC) 08/15/2023   High anion gap metabolic acidosis 08/15/2023   Prolonged QT interval 07/22/2023   Uncontrolled type 1 diabetes mellitus with hypoglycemia, with long-term current use of insulin  (HCC) 04/05/2023  Hyperosmolar hyperglycemic state (HHS) (HCC) 03/26/2023   Iron  deficiency anemia 03/26/2023   Seizure disorder (HCC) 03/26/2023   Systemic inflammatory response syndrome (SIRS) associated with organ dysfunction (HCC) 03/26/2023   NSTEMI (non-ST elevated  myocardial infarction) (HCC) 03/09/2023   Elevated troponin 03/08/2023   Paroxysmal atrial fibrillation (HCC) 03/08/2023   Chronic anticoagulation 03/08/2023   ICH (intracerebral hemorrhage) (HCC) 02/16/2023   Uncontrolled diabetes mellitus with hyperglycemia (HCC) 02/16/2023   GERD without esophagitis 02/16/2023   Hypertension 02/16/2023   Diabetic neuropathy (HCC) 02/16/2023   Intraventricular hemorrhage (HCC) 02/16/2023   Myoclonus 01/15/2023   Hypothermia 12/29/2022   Dizziness 11/13/2022   Substance abuse (HCC) 09/18/2022   History of GI bleed 09/18/2022   History of intraventricular hemorrhage following syncopal event 02/15/2023 09/18/2022   Uncontrolled type 1 diabetes mellitus with hyperglycemia, with long-term current use of insulin  (HCC) 08/26/2022   Postural dizziness with presyncope 08/18/2022   Near syncope 08/18/2022   Closed fracture of shaft of left humerus 06/18/2022   Malnutrition of moderate degree 06/10/2022   Orthostatic hypotension 06/10/2022   GI bleed 06/06/2022   Subdural hematoma (HCC) 05/15/2022   Falls 05/15/2022   Shoulder pain, left 05/15/2022   History of amputation of left great toe (HCC) 05/04/2022   Hypophosphatemia 04/28/2022   Protein-calorie malnutrition, severe 04/24/2022   Diabetic ulcer of toe associated with diabetes mellitus due to underlying condition, with fat layer exposed (HCC) 04/24/2022   Streptococcal bacteremia 04/24/2022   Leukopenia 04/24/2022   History of multiple myeloma 04/24/2022   Dysautonomia orthostatic hypotension with recurrent syncope 04/24/2022   Weight loss 04/23/2022   Osteomyelitis of left foot (HCC) 04/22/2022   DKA, type 1 (HCC) 04/22/2022   Dehydration 04/08/2022   Diabetic ketoacidosis without coma associated with type 1 diabetes mellitus (HCC) 04/07/2022   Hyperkalemia 04/07/2022   Traumatic pneumothorax 02/18/2022   Multiple fractures of ribs, left side, initial encounter for closed fracture 02/18/2022    Pneumothorax, closed, traumatic 02/18/2022   Benign prostatic hyperplasia with nocturia 02/12/2022   Stroke (cerebrum) (HCC) 11/07/2021   Multiple myeloma (HCC) 09/29/2021   Multiple myeloma not having achieved remission (HCC) 09/29/2021   Hyponatremia 08/18/2021   COVID-19 virus infection 08/13/2021   History of CVA (cerebrovascular accident) 08/13/2021   Syncope and collapse, recurrent 08/09/2021   Hepatic lesion 08/05/2021   Dyslipidemia 05/08/2021   History of substance abuse (HCC) 12/09/2020   Acute metabolic encephalopathy 12/02/2020   Cocaine abuse (HCC) 11/30/2020   Muscle twitching 09/12/2020   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   Pre-syncope 08/01/2020   Heart block AV first degree 08/01/2020   Anemia due to chronic kidney disease 05/28/2020   PUD (peptic ulcer disease)    Esophageal dysphagia    Encounter for screening colonoscopy    Hyperglycemia 07/28/2019   Generalized weakness 07/28/2019   Hypoglycemia 04/27/2019   Unresponsiveness 04/27/2019   Lung nodule 04/07/2019   Acute renal failure superimposed on stage 3a chronic kidney disease (HCC)    Hypertensive urgency 04/05/2019   AKI (acute kidney injury) (HCC) 04/05/2019   CKD (chronic kidney disease) stage 3, GFR 30-59 ml/min (HCC) 04/05/2019   Hyperlipidemia 01/24/2016   Type 1 diabetes mellitus with diabetic neuropathy, unspecified (HCC) 05/31/2006   PCP:  Monique Ano, MD Pharmacy:   [facility] [address] ([address]), [place] - [address] [place]) [place] [address] Phone: [phone] Fax: [phone]  [facility] - [place], [place] - [id number] [place] [address] Phone: [phone] Fax: [phone]  [facility] - [place], [place] - [address] [place] [address] Phone: [phone] Fax: [phone]  [facility] [address] [place] [address] Phone: [phone] Fax: [phone]     Social Drivers of Health (SDOH) Social History: SDOH Screenings   Food Insecurity: No Food Insecurity (09/15/2023)  Housing: Low Risk  (09/15/2023)  Transportation Needs: No Transportation Needs (09/15/2023)  Utilities: Not At Risk (09/15/2023)  Alcohol Screen: Low Risk  (10/02/2020)  Depression (PHQ2-9): Medium Risk (10/02/2020)  Financial Resource Strain: Low Risk  (05/19/2023)   Received from [facility]  Physical Activity: Insufficiently Active (01/01/2020)  Social Connections: Patient Declined (09/15/2023)  Stress: No Stress Concern Present (01/01/2020)  Tobacco Use: Low Risk  (09/14/2023)  Recent Concern: Tobacco Use - Medium Risk (08/13/2023)   Received from [facility]   SDOH Interventions:     Readmission Risk Interventions    02/16/2023    3:56 PM 08/24/2022   10:57 AM 05/20/2022   11:41 AM  Readmission Risk Prevention Plan  Transportation Screening Complete Complete Complete  Medication Review Oceanographer) Complete Complete Complete  PCP or Specialist appointment within 3-5 days of discharge Complete Complete Complete  HRI or Home Care Consult Complete Complete Complete  SW Recovery Care/Counseling Consult Not Complete    Palliative Care Screening Not Applicable  Complete  Skilled Nursing Facility Complete Complete Complete

## 2023-09-17 NOTE — [place] FL2 (Addendum)
 [place]  MEDICAID FL2 LEVEL OF CARE FORM     IDENTIFICATION  Patient Name: Nicholas Weiss Birthdate: 03-Aug-1950 Sex: male Admission Date (Current Location): 09/14/2023  [place] and [place] Number:  Chiropodist and Address:  [facility], [address] [place], [place] [address]      Provider Number: [id number]  Attending Physician Name and Address:  Verla Glaze, MD  Relative Name and Phone Number:  Adell Age, son, phone: [phone]; Cyndy Dresser, significant other, phone: [phone]    Current Level of Care: Hospital Recommended Level of Care: Skilled Nursing Facility Prior Approval Number:    Date Approved/Denied:  05/21/2022 PASRR Number:   [id number] Discharge Plan: SNF    Current Diagnoses: Patient Active Problem List   Diagnosis Date Noted   Drop in hemoglobin 09/16/2023   Left arm pain 09/15/2023   Fall 09/15/2023   Polysubstance abuse (HCC) 09/15/2023   Myocardial injury 09/15/2023   Subdural bleeding (HCC) 08/27/2023   SDH (subdural hematoma) (HCC) 08/27/2023   MCI (mild cognitive impairment) with memory loss 08/18/2023   DKA (diabetic ketoacidosis) (HCC) 08/15/2023   High anion gap metabolic acidosis 08/15/2023   Prolonged QT interval 07/22/2023   Uncontrolled type 1 diabetes mellitus with hypoglycemia, with long-term current use of insulin  (HCC) 04/05/2023   Hyperosmolar hyperglycemic state (HHS) (HCC) 03/26/2023   Iron  deficiency anemia 03/26/2023   Seizure disorder (HCC) 03/26/2023   Systemic inflammatory response syndrome (SIRS) associated with organ dysfunction (HCC) 03/26/2023   NSTEMI (non-ST elevated myocardial infarction) (HCC) 03/09/2023   Elevated troponin 03/08/2023   Paroxysmal atrial fibrillation (HCC) 03/08/2023   Chronic anticoagulation 03/08/2023   ICH (intracerebral hemorrhage) (HCC) 02/16/2023   Uncontrolled diabetes mellitus with hyperglycemia (HCC) 02/16/2023   GERD without  esophagitis 02/16/2023   Hypertension 02/16/2023   Diabetic neuropathy (HCC) 02/16/2023   Intraventricular hemorrhage (HCC) 02/16/2023   Myoclonus 01/15/2023   Hypothermia 12/29/2022   Dizziness 11/13/2022   Substance abuse (HCC) 09/18/2022   History of GI bleed 09/18/2022   History of intraventricular hemorrhage following syncopal event 02/15/2023 09/18/2022   Uncontrolled type 1 diabetes mellitus with hyperglycemia, with long-term current use of insulin  (HCC) 08/26/2022   Postural dizziness with presyncope 08/18/2022   Near syncope 08/18/2022   Closed fracture of shaft of left humerus 06/18/2022   Malnutrition of moderate degree 06/10/2022   Orthostatic hypotension 06/10/2022   GI bleed 06/06/2022   Subdural hematoma (HCC) 05/15/2022   Falls 05/15/2022   Shoulder pain, left 05/15/2022   History of amputation of left great toe (HCC) 05/04/2022   Hypophosphatemia 04/28/2022   Protein-calorie malnutrition, severe 04/24/2022   Diabetic ulcer of toe associated with diabetes mellitus due to underlying condition, with fat layer exposed (HCC) 04/24/2022   Streptococcal bacteremia 04/24/2022   Leukopenia 04/24/2022   History of multiple myeloma 04/24/2022   Dysautonomia orthostatic hypotension with recurrent syncope 04/24/2022   Weight loss 04/23/2022   Osteomyelitis of left foot (HCC) 04/22/2022   DKA, type 1 (HCC) 04/22/2022   Dehydration 04/08/2022   Diabetic ketoacidosis without coma associated with type 1 diabetes mellitus (HCC) 04/07/2022   Hyperkalemia 04/07/2022   Traumatic pneumothorax 02/18/2022   Multiple fractures of ribs, left side, initial encounter for closed fracture 02/18/2022   Pneumothorax, closed, traumatic 02/18/2022   Benign prostatic hyperplasia with nocturia 02/12/2022   Stroke (cerebrum) (HCC) 11/07/2021   Multiple myeloma (HCC) 09/29/2021   Multiple myeloma not having achieved remission (HCC) 09/29/2021   Hyponatremia 08/18/2021   COVID-19 virus  infection  08/13/2021   History of CVA (cerebrovascular accident) 08/13/2021   Syncope and collapse, recurrent 08/09/2021   Hepatic lesion 08/05/2021   Dyslipidemia 05/08/2021   History of substance abuse (HCC) 12/09/2020   Acute metabolic encephalopathy 12/02/2020   Cocaine abuse (HCC) 11/30/2020   Muscle twitching 09/12/2020   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   Pre-syncope 08/01/2020   Heart block AV first degree 08/01/2020   Anemia due to chronic kidney disease 05/28/2020   PUD (peptic ulcer disease)    Esophageal dysphagia    Encounter for screening colonoscopy    Hyperglycemia 07/28/2019   Generalized weakness 07/28/2019   Hypoglycemia 04/27/2019   Unresponsiveness 04/27/2019   Lung nodule 04/07/2019   Acute renal failure superimposed on stage 3a chronic kidney disease (HCC)    Hypertensive urgency 04/05/2019   AKI (acute kidney injury) (HCC) 04/05/2019   CKD (chronic kidney disease) stage 3, GFR 30-59 ml/min (HCC) 04/05/2019   Hyperlipidemia 01/24/2016   Type 1 diabetes mellitus with diabetic neuropathy, unspecified (HCC) 05/31/2006    Orientation RESPIRATION BLADDER Height & Weight     Self, Time, Situation, Place  Normal Continent Weight: 75.5 kg Height:  5' 10 (177.8 cm)  BEHAVIORAL SYMPTOMS/MOOD NEUROLOGICAL BOWEL NUTRITION STATUS      Continent Diet (Please see discharge summary)  AMBULATORY STATUS COMMUNICATION OF NEEDS Skin   Limited Assist Verbally  (Dry, abraison over right eye, non-tenting)                       Personal Care Assistance Level of Assistance  Bathing, Feeding, Dressing Bathing Assistance: Limited assistance Feeding assistance: Limited assistance Dressing Assistance: Limited assistance     Functional Limitations Info  Sight Sight Info: Impaired        SPECIAL CARE FACTORS FREQUENCY  PT (By licensed PT), OT (By licensed OT)     PT Frequency: 5 x per week OT Frequency: 5 x per week            Contractures      Additional  Factors Info  Code Status, Allergies Code Status Info: Full code Allergies Info: Penicillins  No severity specified - Other (See Comments) Comments           Current Medications (09/17/2023):  This is the current hospital active medication list Current Facility-Administered Medications  Medication Dose Route Frequency Provider Last Rate Last Admin   acetaminophen  (TYLENOL ) tablet 650 mg  650 mg Oral Q6H PRN Niu, Xilin, MD       acyclovir  (ZOVIRAX ) 200 MG capsule 400 mg  400 mg Oral BID Niu, Xilin, MD   400 mg at 09/17/23 0804   atorvastatin  (LIPITOR ) tablet 80 mg  80 mg Oral QHS Niu, Xilin, MD   80 mg at 09/16/23 2122   insulin  aspart (novoLOG ) injection 0-5 Units  0-5 Units Subcutaneous QHS Wieting, Richard, MD       insulin  aspart (novoLOG ) injection 0-9 Units  0-9 Units Subcutaneous TID WC Verla Glaze, MD   2 Units at 09/17/23 1221   insulin  glargine-yfgn (SEMGLEE ) injection 4 Units  4 Units Subcutaneous Daily Verla Glaze, MD   4 Units at 09/17/23 1029   iron  polysaccharides (NIFEREX) capsule 150 mg  150 mg Oral Daily Niu, Xilin, MD   150 mg at 09/17/23 1029   levETIRAcetam  (KEPPRA ) tablet 500 mg  500 mg Oral BID Niu, Xilin, MD   500 mg at 09/17/23 0804   LORazepam  (ATIVAN ) injection 2 mg  2 mg Intravenous  Q2H PRN Niu, Xilin, MD       midodrine  (PROAMATINE ) tablet 2.5 mg  2.5 mg Oral TID with meals Wieting, Richard, MD       ondansetron  (ZOFRAN ) injection 4 mg  4 mg Intravenous Q8H PRN Niu, Xilin, MD       oxyCODONE -acetaminophen  (PERCOCET/ROXICET) 5-325 MG per tablet 1 tablet  1 tablet Oral Q4H PRN Niu, Xilin, MD   1 tablet at 09/17/23 0804   Facility-Administered Medications Ordered in Other Encounters  Medication Dose Route Frequency Provider Last Rate Last Admin   epoetin  alfa-epbx (RETACRIT ) injection 40,000 Units  40,000 Units Subcutaneous Q21 days Rao, Archana C, MD   40,000 Units at 07/09/23 1153     Discharge Medications: Please see discharge summary for a list of  discharge medications.  Relevant Imaging Results:  Relevant Lab Results:   Additional Information SSN: [id number]  Crayton Docker, RN

## 2023-09-18 DIAGNOSIS — I951 Orthostatic hypotension: Secondary | ICD-10-CM | POA: Diagnosis not present

## 2023-09-18 DIAGNOSIS — G35D Multiple sclerosis, unspecified: Secondary | ICD-10-CM | POA: Insufficient documentation

## 2023-09-18 DIAGNOSIS — N179 Acute kidney failure, unspecified: Secondary | ICD-10-CM | POA: Diagnosis not present

## 2023-09-18 DIAGNOSIS — N400 Enlarged prostate without lower urinary tract symptoms: Secondary | ICD-10-CM | POA: Insufficient documentation

## 2023-09-18 DIAGNOSIS — R569 Unspecified convulsions: Secondary | ICD-10-CM

## 2023-09-18 DIAGNOSIS — R55 Syncope and collapse: Secondary | ICD-10-CM | POA: Diagnosis not present

## 2023-09-18 DIAGNOSIS — S42309A Unspecified fracture of shaft of humerus, unspecified arm, initial encounter for closed fracture: Secondary | ICD-10-CM | POA: Insufficient documentation

## 2023-09-18 DIAGNOSIS — M79602 Pain in left arm: Secondary | ICD-10-CM | POA: Diagnosis not present

## 2023-09-18 DIAGNOSIS — S065XAA Traumatic subdural hemorrhage with loss of consciousness status unknown, initial encounter: Secondary | ICD-10-CM | POA: Diagnosis not present

## 2023-09-18 LAB — GLUCOSE, CAPILLARY
Glucose-Capillary: 140 mg/dL — ABNORMAL HIGH (ref 70–99)
Glucose-Capillary: 131 mg/dL — ABNORMAL HIGH (ref 70–99)

## 2023-09-18 NOTE — Discharge Summary (Signed)
 Physician Discharge Summary   Patient: Nicholas Weiss MRN: [id number] DOB: 26-Nov-1950  Admit date:     09/14/2023  Discharge date: 09/19/23  Discharge Physician: Charlie Patterson   PCP: Alla Amis, MD   Recommendations at discharge:    Follow up team at [facility] Follow up pcp upon discharge from rehab Follow up with orthopedics Can resume pomalyst  once home  Discharge Diagnoses: Principal Problem:   Drop in hemoglobin Active Problems:   Syncope and collapse, recurrent   Fall   Orthostatic hypotension   SDH (subdural hematoma) (HCC)   Left arm pain   Acute renal failure superimposed on stage 3a chronic kidney disease (HCC)   Type 1 diabetes mellitus with diabetic neuropathy, unspecified (HCC)   Dyslipidemia   Seizure disorder (HCC)   Anemia due to chronic kidney disease   Paroxysmal atrial fibrillation (HCC)   History of CVA (cerebrovascular accident)   Myocardial injury   Iron  deficiency anemia   Multiple myeloma (HCC)   Cocaine abuse (HCC)   Polysubstance abuse (HCC)  Resolved Problems:   * No resolved hospital problems. *  Hospital Course:  72 y.o. male with medical history significant of type 1 diabetes, DKA, cocaine abuse, orthostatic hypotension, hyperlipidemia, type 1 diabetes, stroke, BPH, CKD-3, PUD, anemia, multiple sclerosis, recurrent multiple falls, SDH, SAH, who presents with syncope and fall.   Patient was recently hospitalized from 5/30 - 6/3 due to fall which resulted in SDH.  He is Eliquis  is on hold.  Possibly due to orthostatic hypotension.  Patient was started as needed midodrine  and Florinef .   Patient has had four ED visits recently due to multiple falls.  He states that he fainted and fell in his kitchen earlier today.  Denies unilateral numbness or tingling in extremities.  No facial droop or slurred speech.  No new head injury.  He complains of left arm pain around elbow area, which is constant, severe, sharp, nonradiating, aggravated by  movement.  Left arm is swollen, no erythema.  Denies chest pain, cough, SOB.  No nausea, vomiting, diarrhea or abdominal pain.  No symptoms of UTI.  He reports lightheadedness and dizziness intermittently.     Data reviewed independently and ED Course: pt was found to have WBC 4.0, stable hemoglobin, worsening renal function, temperature normal, blood pressure 100/59, heart rate 82, 15, oxygen saturation 95% on room air.  Patient is previously in telemetry bed position.   CT of head: 1. No acute intracranial abnormality. 2. Stable trace low-density chronic left subdural hematoma, without mass effect or midline shift. 3. Atrophy with small vessel ischemic changes.  6/18.  Patient feels okay.  Not wearing sling for his left arm.  Found to be orthostatic with physical therapy team. 6/19.  Hemoglobin down to 7.2, will transfuse 1 unit of packed red blood cells.  Likely had some bleeding into his left arm with the swelling and fracture. 6/20.  Refused labs today on 2 occasions.  Assessment and Plan: * Drop in hemoglobin Hemoglobin dropped down to 7.2.  Likely with IV fluid boluses and also may have had some bleeding in his arm with the fracture.  Patient given 1 unit of packed red blood cells on 6/19.  Hemoglobin 10.8 upon discharge.  Syncope and collapse, recurrent Secondary to orthostatic hypotension  Orthostatic hypotension Continue midodrine  2.5 mg po three times a day.  As needed florinef , especially if walking with PT. Continue TED hose and abdominal binder.  Check orthostatics daily.  Left arm pain Left humeral  fracture seen on 6/12.  Patient noncompliant with sling.  I placed his arm in a sling today.  Repeated left shoulder x-ray which shows similar humeral fracture as previously seen.  SDH (subdural hematoma) (HCC) Eliquis  on hold.  CT scan shows stable subdural.  Acute renal failure superimposed on stage 3a chronic kidney disease (HCC) Creatinine 1.73 on presentation.  Last  creatinine 1.25.  Baseline creatinine 1.31  Dyslipidemia On Lipitor   Type 1 diabetes mellitus with diabetic neuropathy, unspecified (HCC) Semglee  insulin  and sliding scale insulin  ordered.  Patient uses insulin  pump at home.  Seizure disorder (HCC) On Keppra   Paroxysmal atrial fibrillation (HCC) Not a candidate with Eliquis  with subdural hematomas and falls.  Anemia due to chronic kidney disease Patient transfused 1 unit of packed red blood cells on a hemoglobin of 7.2.  Refused repeat labs         Consultants: none Procedures performed: none Disposition: Rehabilitation facility Diet recommendation:  Carb modified diet DISCHARGE MEDICATION: Allergies as of 09/18/2023       Reactions   Penicillins Other (See Comments)   Childhood allergy -tolerated amoxil  03/2022 Unknown reaction Has patient had a PCN reaction causing immediate rash, facial/tongue/throat swelling, SOB or lightheadedness with hypotension: No Has patient had a PCN reaction causing severe rash involving mucus membranes or skin necrosis: No Has patient had a PCN reaction that required hospitalization No Has patient had a PCN reaction occurring within the last 10 years: No If all of the above answers are NO, then may proceed with Cephalosporin use.        Medication List     STOP taking these medications    apixaban  5 MG Tabs tablet Commonly known as: ELIQUIS    ascorbic acid  500 MG tablet Commonly known as: VITAMIN C    Insulin  Aspart FlexPen 100 UNIT/ML Commonly known as: NOVOLOG  Replaced by: insulin  aspart 100 UNIT/ML injection   insulin  lispro 100 UNIT/ML injection Commonly known as: HUMALOG   multivitamin with minerals Tabs tablet   ondansetron  8 MG tablet Commonly known as: ZOFRAN    pantoprazole  40 MG tablet Commonly known as: PROTONIX    polyethylene glycol 17 g packet Commonly known as: MiraLax    Pomalyst  2 MG capsule Generic drug: pomalidomide    Tresiba  FlexTouch 100 UNIT/ML  FlexTouch Pen Generic drug: insulin  degludec       TAKE these medications    acetaminophen  325 MG tablet Commonly known as: TYLENOL  Take 2 tablets (650 mg total) by mouth every 6 (six) hours as needed for mild pain (pain score 1-3) or fever.   acyclovir  400 MG tablet Commonly known as: ZOVIRAX  Take 1 tablet (400 mg total) by mouth 2 (two) times daily.   atorvastatin  80 MG tablet Commonly known as: LIPITOR  Take 1 tablet (80 mg total) by mouth at bedtime. Hold while taking Paxlovid    bisacodyl  5 MG EC tablet Generic drug: bisacodyl  Take 1 tablet (5 mg total) by mouth at bedtime as needed for mild constipation. Skip the dose if no constipation   Dexcom G7 Sensor Misc 1 each by Does not apply route 4 (four) times daily -  before meals and at bedtime.   fludrocortisone  0.1 MG tablet Commonly known as: FLORINEF  Take 1 tablet (0.1 mg total) by mouth daily as needed. Skip the dose if SBP >130   gabapentin  300 MG capsule Commonly known as: NEURONTIN  Take 1 capsule (300 mg total) by mouth 2 (two) times daily.   insulin  aspart 100 UNIT/ML injection Commonly known as: novoLOG  Inject 1 Units  into the skin 3 (three) times daily with meals. If eating and Blood Glucose (BG) 80 or higher inject 1 units for meal coverage and add correction dose per scale. If not eating, correction dose only. BG <150= 0 unit; BG 150-200= 1 unit; BG 201-250= 2 unit; BG 251-300= 3 unit; BG 301-350= 4 unit; BG 351-400= 5 unit; BG >400= 6 unit and Call Primary Care. Replaces: Insulin  Aspart FlexPen 100 UNIT/ML   insulin  glargine-yfgn 100 UNIT/ML injection Commonly known as: SEMGLEE  Inject 0.04 mLs (4 Units total) into the skin daily.   iron  polysaccharides 150 MG capsule Commonly known as: NIFEREX Take 1 capsule (150 mg total) by mouth daily.   levETIRAcetam  500 MG tablet Commonly known as: KEPPRA  Take 1 tablet by mouth twice daily   midodrine  2.5 MG tablet Commonly known as: PROAMATINE  Take 2.5 mg by  mouth 3 (three) times daily.   oxyCODONE -acetaminophen  5-325 MG tablet Commonly known as: Percocet Take 1 tablet by mouth every 6 (six) hours as needed for severe pain (pain score 7-10). What changed: when to take this        Contact information for follow-up providers     Alla Amis, MD Follow up.   Specialty: Family Medicine Why: hospital follow up Contact information: [address] [facility] [place] [address] [phone]         Ulis Bottcher, PA-C Follow up.   Specialty: Physician Assistant Why: keep appointment on 6/24 at 1:30 pm Contact information: [address] [place], [address] [place] [address] [phone]         Tobie Priest, MD Follow up in 2 week(s).   Specialty: Orthopedic Surgery Contact information: [address] [place] [address] [phone]              Contact information for after-discharge care     Destination     [facility] .   Service: Skilled Nursing Contact information: [address] [place]  [address] [phone]                    Discharge Exam: Nicholas Weiss   09/14/23 1643 09/15/23 0154  Weight: 66.1 kg 75.5 kg   Physical Exam HENT:     Head: Normocephalic.     Mouth/Throat:     Pharynx: No oropharyngeal exudate.   Eyes:     General: Lids are normal.     Conjunctiva/sclera: Conjunctivae normal.    Cardiovascular:     Rate and Rhythm: Normal rate and regular rhythm.     Heart sounds: S1 normal and S2 normal. Murmur heard.     Systolic murmur is present with a grade of 2/6.  Pulmonary:     Breath sounds: No decreased breath sounds, wheezing, rhonchi or rales.  Abdominal:     Palpations: Abdomen is soft.     Tenderness: There is no abdominal tenderness.   Musculoskeletal:     Right upper arm: Swelling present.     Right lower leg: No swelling.     Left lower leg:  No swelling.   Skin:    General: Skin is warm.     Findings: No rash.   Neurological:     Mental Status: He is alert.      Condition at discharge: fair  The results of significant diagnostics from this hospitalization (including imaging, microbiology, ancillary and laboratory) are listed below for reference.   Imaging Studies: DG Shoulder  Left Result Date: 09/15/2023 CLINICAL DATA:  144008 Arm swelling [id number] EXAM: LEFT SHOULDER - 2+ VIEW COMPARISON:  X-ray left shoulder 09/09/2023 FINDINGS: No dislocation. Similar-appearing acute displaced and comminuted fracture of the left humeral neck. There is no evidence of severe arthropathy or other focal bone abnormality. Subcutaneus soft tissue edema. IMPRESSION: Similar-appearing acute displaced and comminuted fracture of the left humeral neck. Electronically Signed   By: Morgane  Naveau M.D.   On: 09/15/2023 19:28   DG Forearm Left Result Date: 09/15/2023 EXAM: 3 VIEW(S) XRAY OF THE LEFT FOREARM 09/15/2023 03:01:00 AM COMPARISON: None available. CLINICAL HISTORY: Left arm pain. Patient c/o sharp pain to left arm. No known injury. FINDINGS: BONES AND JOINTS: Suspected old post-traumatic deformity involving the distal radius / radial styloid. No acute fracture. SOFT TISSUES: The soft tissues are unremarkable. IMPRESSION: 1. No acute fracture. Electronically signed by: Pinkie Pebbles MD 09/15/2023 03:04 AM EDT RP Workstation: [id number]   DG Elbow 2 Views Left Result Date: 09/15/2023 EXAM: 2 VIEW(S) XRAY OF THE LEFT ELBOW COMPARISON: None available. CLINICAL HISTORY: Left arm pain. Patient c/o sharp pain to left arm. No known injury. FINDINGS: BONES AND JOINTS: No acute fracture. No focal osseous lesion. No joint dislocation. SOFT TISSUES: Moderate soft tissue swelling overlying the dorsal elbow / olecranon, raising the possibility of olecranon bursitis. IMPRESSION: 1. Moderate soft tissue swelling overlying the dorsal elbow/olecranon, raising the  possibility of olecranon bursitis. Electronically signed by: Pinkie Pebbles MD 09/15/2023 03:03 AM EDT RP Workstation: [id number]   CT Head Wo Contrast Result Date: 09/14/2023 EXAM: CT HEAD WITHOUT CONTRAST 09/14/2023 08:01:25 PM TECHNIQUE: CT of the head was performed without the administration of intravenous contrast. Automated exposure control, iterative reconstruction, and/or weight based adjustment of the mA/kV was utilized to reduce the radiation dose to as low as reasonably achievable. COMPARISON: 09/13/2023 CLINICAL HISTORY: Fall. Pt to ED via [facility]. Pt was going to [facility] for follow up visit when staff brought him to ED due to confusion and diaphoresis. On arrival pt alert and diaphoretic. Pt A\T\Ox3. Pt with partner who states pt has been more confused today with decreased appetite. Pt ; hx of DM. CBG 203 on arrival. FINDINGS: BRAIN AND VENTRICLES: Global cortical atrophy. Subcortical and periventricular small vessel ischemic changes. Stable trace low-density chronic left subdural hematoma, measuring approximately 2 mm (image 37), without mass effect or midline shift. ORBITS: The visualized portion of the orbits demonstrate no acute abnormality. SINUSES: The visualized paranasal sinuses and mastoid air cells demonstrate no acute abnormality. SOFT TISSUES AND SKULL: No acute abnormality of the visualized skull or soft tissues. IMPRESSION: 1. No acute intracranial abnormality. 2. Stable trace low-density chronic left subdural hematoma, without mass effect or midline shift. 3. Atrophy with small vessel ischemic changes. Electronically signed by: Pinkie Pebbles MD 09/14/2023 08:22 PM EDT RP Workstation: [id number]   CT HEAD WO CONTRAST ( ) Result Date: 09/14/2023 EXAM: CT HEAD WITHOUT CONTRAST 09/13/2023 03:11:58 PM TECHNIQUE: CT of the head was performed without the administration of intravenous contrast. Automated exposure control, iterative reconstruction, and/or weight based adjustment of the mA/kV  was utilized to reduce the radiation dose to as low as reasonably achievable. COMPARISON: 09/11/2023 CLINICAL HISTORY: Subdural hematoma. F/u subdural hematoma, last CT Head obtained 09/11/23 while in ED; Pt reporting severe left arm pain with swelling at elbow, states he doesn't know why. FINDINGS: BRAIN AND VENTRICLES: Global cortical atrophy. Subcortical and periventricular small vessel ischemic changes. Suspected trace low-density chronic left subdural hematoma, measuring approximately 2 mm (image 42),  without mass effect or midline shift. ORBITS: The visualized portion of the orbits demonstrate no acute abnormality. SINUSES: The visualized paranasal sinuses and mastoid air cells demonstrate no acute abnormality. SOFT TISSUES AND SKULL: No acute abnormality of the visualized skull or soft tissues. IMPRESSION: 1. Suspected trace low-density chronic left subdural hematoma, without mass effect or midline shift. 2. Atrophy with small vessel ischemic changes. Electronically signed by: Pinkie Pebbles MD 09/14/2023 08:21 PM EDT RP Workstation: [id number]   CT HEAD WO CONTRAST ( ) Result Date: 09/11/2023 EXAM: CT HEAD WITHOUT CONTRAST 09/11/2023 07:13:01 PM TECHNIQUE: CT of the head was performed without the administration of intravenous contrast. Automated exposure control, iterative reconstruction, and/or weight based adjustment of the mA/kV was utilized to reduce the radiation dose to as low as reasonably achievable. COMPARISON: 08/27/2023 CLINICAL HISTORY: Head trauma, minor (Age >= 65y). Per ed notes; Per family patient has had multiple falls today and has a change in mental status; history of orthostatic positive. FINDINGS: BRAIN AND VENTRICLES: There is no acute intracranial hemorrhage, mass effect or midline shift. No abnormal extra-axial fluid collection. The gray-white differentiation is maintained without an acute infarct. There is no hydrocephalus. Complete / near complete resolution of prior left  subdural hematoma (coronal image 43), although poorly evaluated. Suspected complete resolution of prior right subdural hematoma, although poorly evaluated. ORBITS: The visualized portion of the orbits demonstrate no acute abnormality. SINUSES: The visualized paranasal sinuses and mastoid air cells demonstrate no acute abnormality. SOFT TISSUES AND SKULL: No acute abnormality of the visualized skull or soft tissues. IMPRESSION: 1. Motion degraded images, limiting evaluation. 2. Complete/near complete resolution of prior thin subdural hematomas, although poorly evaluated. 3. No acute intracranial abnormality. Electronically signed by: Pinkie Pebbles MD 09/11/2023 07:35 PM EDT RP Workstation: [id number]   DG Shoulder Left Result Date: 09/09/2023 CLINICAL DATA:  Status post fall onto the left shoulder EXAM: LEFT SHOULDER - 3 VIEW COMPARISON:  None Available. FINDINGS: Comminuted fracture of the left humeral neck with 1 cortical width medial displacement. No acute dislocation of the glenohumeral joint. Degenerative changes of the shoulder. IMPRESSION: Minimally displaced, comminuted fracture of the left humeral neck. Electronically Signed   By: Limin  Xu M.D.   On: 09/09/2023 13:30   DG Abd 1 View Result Date: 08/30/2023 CLINICAL DATA:  Constipation EXAM: ABDOMEN - 1 VIEW COMPARISON:  None Available. FINDINGS: Normal colonic stool burden. Nonobstructive bowel gas pattern. Visualized osseous structures are within normal limits. IMPRESSION: Negative.  Normal colonic stool burden. Electronically Signed   By: Pinkie Pebbles M.D.   On: 08/30/2023 21:06   EEG adult Result Date: 08/27/2023 Shelton Arlin KIDD, MD     08/27/2023  4:32 PM Patient Name: TYLER CUBIT MRN: [id number] Epilepsy Attending: Arlin KIDD Shelton Referring Physician/Provider: Laurita Cort DASEN, MD Date: 08/27/2023 Duration: 31.23 mins Patient history: 72yo M with h/o seizures came with syncope. EEG to evaluate for seizure Level of alertness: Awake,  asleep AEDs during EEG study: LEV, GBP Technical aspects: This EEG study was done with scalp electrodes positioned according to the 10-20 International system of electrode placement. Electrical activity was reviewed with band pass filter of 1-70Hz , sensitivity of 7 uV/mm, display speed of 20mm/sec with a 60Hz  notched filter applied as appropriate. EEG data were recorded continuously and digitally stored.  Video monitoring was available and reviewed as appropriate. Description: The posterior dominant rhythm consists of 7.5 Hz activity of moderate voltage (25-35 uV) seen predominantly in posterior head regions, symmetric and reactive to eye opening and  eye closing. Sleep was characterized by sleep spindles (12 to 14 Hz), maximal frontocentral region. EEG showed continuous generalized 5 to 7 Hz theta slowing admixed with intermittent generalized 2-3 Hz delta slowing, at times with triphasic morphology. Hyperventilation and photic stimulation were not performed.    ABNORMALITY - Continuous slow, generalized  IMPRESSION: This study is suggestive of moderate diffuse encephalopathy. No seizures or epileptiform discharges were seen throughout the recording.  Priyanka MALVA Krebs    CT HEAD WO CONTRAST ( ) Result Date: 08/27/2023 CLINICAL DATA:  73 year old male status post fall with hypodense left subdural hematoma. EXAM: CT HEAD WITHOUT CONTRAST TECHNIQUE: Contiguous axial images were obtained from the base of the skull through the vertex without intravenous contrast. RADIATION DOSE REDUCTION: This exam was performed according to the departmental dose-optimization program which includes automated exposure control, adjustment of the mA and/or kV according to patient size and/or use of iterative reconstruction technique. COMPARISON:  Head CT 0015 hours today. FINDINGS: Brain: Redemonstrated left side subdural hematoma, hyperdense blood products noted along the left frontal operculum level on coronal image 34. Left SDH  thickness is 4-5 mm and not significantly changed. Smaller contralateral and mostly superior convexity low-density right side subdural hematoma also noted, increased in conspicuity but small measuring 2-3 mm (coronal image 45). Also there is hyperdense para falcine subdural blood, small volume (coronal image 34) up to 3 mm and stable superimposed on dural calcification. Trace rightward midline shift (series 6, image 15). Ventricle size and configuration remains normal. No IVH. No subarachnoid or intra-axial blood. Stable gray-white matter differentiation throughout the brain. No cortically based acute infarct identified. Vascular: Calcified atherosclerosis at the skull base. No suspicious intracranial vascular hyperdensity. Skull: Stable.  No skull fracture identified. Sinuses/Orbits: Visualized paranasal sinuses and mastoids are stable and well aerated. Other: Leftward gaze now. No acute orbit or scalp soft tissue injury is identified. IMPRESSION: 1. Positive for small Bilateral Subdural Hematomas: - right side mixed density SDH is 4-5 mm thickness and not significantly changed from 0015 hours. - left SDH is low-density and smaller, 2-3 mm, more conspicuous since the prior exam. - small hyperdense right parafalcine SDH is stable, 3 mm. 2. No skull fracture identified. No significant intracranial mass effect at this time, trace rightward midline shift. Electronically Signed   By: VEAR Hurst M.D.   On: 08/27/2023 06:19   CT Cervical Spine Wo Contrast Result Date: 08/27/2023 CLINICAL DATA:  Fall.  Neck trauma (Age >= 65y) EXAM: CT CERVICAL SPINE WITHOUT CONTRAST TECHNIQUE: Multidetector CT imaging of the cervical spine was performed without intravenous contrast. Multiplanar CT image reconstructions were also generated. RADIATION DOSE REDUCTION: This exam was performed according to the departmental dose-optimization program which includes automated exposure control, adjustment of the mA and/or kV according to patient  size and/or use of iterative reconstruction technique. COMPARISON:  07/21/2023 FINDINGS: Alignment: Normal Skull base and vertebrae: No acute fracture. No primary bone lesion or focal pathologic process. Soft tissues and spinal canal: No prevertebral fluid or swelling. No visible canal hematoma. Disc levels: Degenerative disc disease changes at C5-6 and C6-7 with disc space narrowing and spurring. Moderate degenerative facet disease, left greater than right. Upper chest: No acute findings Other: None IMPRESSION: Degenerative changes.  No acute bony abnormality. Electronically Signed   By: Franky Crease M.D.   On: 08/27/2023 00:29   CT HEAD WO CONTRAST ( ) Result Date: 08/27/2023 CLINICAL DATA:  Head trauma, minor (Age >= 65y).  Fall. EXAM: CT HEAD WITHOUT CONTRAST TECHNIQUE: Contiguous  axial images were obtained from the base of the skull through the vertex without intravenous contrast. RADIATION DOSE REDUCTION: This exam was performed according to the departmental dose-optimization program which includes automated exposure control, adjustment of the mA and/or kV according to patient size and/or use of iterative reconstruction technique. COMPARISON:  07/21/2023 FINDINGS: Brain: There is a low-density to isodense subdural fluid collection noted over the left cerebral convexity, new since prior study concerning for isodense subdural hematoma. This measures approximately 5 mm in thickness on coronal image 48, series 4. No mass effect or midline shift. No hydrocephalus. No intracerebral hemorrhage or acute infarction. Again noted are extensive calcifications along the falx and tentorium. Vascular: No hyperdense vessel or unexpected calcification. Skull: No acute calvarial abnormality. Sinuses/Orbits: No acute findings Other: None IMPRESSION: Low-density to isodense subdural fluid collection measuring 5 mm in thickness over the left cerebral convexity concerning for small subdural hematoma. This is age indeterminate  but new since prior study suggesting acute or subacute. No mass effect or midline shift. Electronically Signed   By: Franky Crease M.D.   On: 08/27/2023 00:27    Microbiology: Results for orders placed or performed during the hospital encounter of 09/11/23  Resp panel by RT-PCR (RSV, Flu A&B, Covid) Anterior Nasal Swab     Status: None   Collection Time: 09/11/23  7:37 PM   Specimen: Anterior Nasal Swab  Result Value Ref Range Status   SARS Coronavirus 2 by RT PCR NEGATIVE NEGATIVE Final    Comment: (NOTE) SARS-CoV-2 target nucleic acids are NOT DETECTED.  The SARS-CoV-2 RNA is generally detectable in upper respiratory specimens during the acute phase of infection. The lowest concentration of SARS-CoV-2 viral copies this assay can detect is 138 copies/mL. A negative result does not preclude SARS-Cov-2 infection and should not be used as the sole basis for treatment or other patient management decisions. A negative result may occur with  improper specimen collection/handling, submission of specimen other than nasopharyngeal swab, presence of viral mutation(s) within the areas targeted by this assay, and inadequate number of viral copies(<138 copies/mL). A negative result must be combined with clinical observations, patient history, and epidemiological information. The expected result is Negative.  Fact Sheet for Patients:  BloggerCourse.com  Fact Sheet for Healthcare Providers:  SeriousBroker.it  This test is no t yet approved or cleared by the [place]  FDA and  has been authorized for detection and/or diagnosis of SARS-CoV-2 by FDA under an Emergency Use Authorization (EUA). This EUA will remain  in effect (meaning this test can be used) for the duration of the COVID-19 declaration under Section 564(b)(1) of the Act, 21 U.S.C.section 360bbb-3(b)(1), unless the authorization is terminated  or revoked sooner.       Influenza  A by PCR NEGATIVE NEGATIVE Final   Influenza B by PCR NEGATIVE NEGATIVE Final    Comment: (NOTE) The Xpert Xpress SARS-CoV-2/FLU/RSV plus assay is intended as an aid in the diagnosis of influenza from Nasopharyngeal swab specimens and should not be used as a sole basis for treatment. Nasal washings and aspirates are unacceptable for Xpert Xpress SARS-CoV-2/FLU/RSV testing.  Fact Sheet for Patients: BloggerCourse.com  Fact Sheet for Healthcare Providers: SeriousBroker.it  This test is not yet approved or cleared by the [place]  FDA and has been authorized for detection and/or diagnosis of SARS-CoV-2 by FDA under an Emergency Use Authorization (EUA). This EUA will remain in effect (meaning this test can be used) for the duration of the COVID-19 declaration under Section 564(b)(1)  of the Act, 21 U.S.C. section 360bbb-3(b)(1), unless the authorization is terminated or revoked.     Resp Syncytial Virus by PCR NEGATIVE NEGATIVE Final    Comment: (NOTE) Fact Sheet for Patients: BloggerCourse.com  Fact Sheet for Healthcare Providers: SeriousBroker.it  This test is not yet approved or cleared by the [place]  FDA and has been authorized for detection and/or diagnosis of SARS-CoV-2 by FDA under an Emergency Use Authorization (EUA). This EUA will remain in effect (meaning this test can be used) for the duration of the COVID-19 declaration under Section 564(b)(1) of the Act, 21 U.S.C. section 360bbb-3(b)(1), unless the authorization is terminated or revoked.  Performed at [facility], [address] [place], [place] [address]    *Note: Due to a large number of results and/or encounters for the requested time period, some results have not been displayed. A complete set of results can be found in Results Review.    Labs: CBC: Recent Labs  Lab 09/13/23 1931  09/14/23 1759 09/15/23 0600 09/16/23 0444 09/17/23 1714  WBC 3.8* 4.0 4.0 3.6* 3.8*  NEUTROABS 2.2  --   --   --   --   HGB 8.4* 8.8* 8.1* 7.2* 10.8*  HCT 26.5* 27.9* 25.5* 22.4* 32.3*  MCV 96.4 95.9 94.8 95.3 92.3  PLT 260 299 288 299 339   Basic Metabolic Panel: Recent Labs  Lab 09/13/23 1931 09/14/23 1759 09/15/23 0600 09/16/23 0444 09/17/23 1714  NA 141 139 138 137 140  K 4.3 4.1 4.9 4.1 3.7  CL 108 106 106 109 107  CO2 23 21* 21* 21* 25  GLUCOSE 103* 213* 291* 195* 66*  BUN 27* 39* 42* 36* 25*  CREATININE 1.31* 1.73* 1.64* 1.25* 1.13  CALCIUM  8.6* 8.6* 8.0* 8.0* 8.3*   Liver Function Tests: Recent Labs  Lab 09/14/23 1759  AST 17  ALT 24  ALKPHOS 64  BILITOT 0.9  PROT 7.4  ALBUMIN 3.0*   CBG: Recent Labs  Lab 09/17/23 1213 09/17/23 1525 09/17/23 2014 09/18/23 0330 09/18/23 0735  GLUCAP 189* 82 101* 140* 131*    Discharge time spent: greater than 30 minutes.  Signed: Charlie Patterson, MD [facility] 09/19/2023

## 2023-09-18 NOTE — Plan of Care (Signed)

## 2023-09-18 NOTE — TOC Progression Note (Signed)
 Transition of Care [facility]) - Progression Note    Patient Details  Name: Nicholas Weiss MRN: [id number] Date of Birth: 02/26/1951  Transition of Care [facility]) CM/SW Contact  Linn Goetze E Alfred Eckley, LCSW Phone Number: 09/18/2023, 9:42 AM  Clinical Narrative:    Shara approved. [place] with [facility] SNFs confirms patient can come today, need DC Summary by 1pm.   Expected Discharge Plan: Skilled Nursing Facility Barriers to Discharge: Continued Medical Work up  Expected Discharge Plan and Services       Living arrangements for the past 2 months: Single Family Home                                       Social Determinants of Health (SDOH) Interventions SDOH Screenings   Food Insecurity: No Food Insecurity (09/15/2023)  Housing: Low Risk  (09/15/2023)  Transportation Needs: No Transportation Needs (09/15/2023)  Utilities: Not At Risk (09/15/2023)  Alcohol Screen: Low Risk  (10/02/2020)  Depression (PHQ2-9): Medium Risk (10/02/2020)  Financial Resource Strain: Low Risk  (05/19/2023)   Received from [facility]  Physical Activity: Insufficiently Active (01/01/2020)  Social Connections: Patient Declined (09/15/2023)  Stress: No Stress Concern Present (01/01/2020)  Tobacco Use: Low Risk  (09/14/2023)  Recent Concern: Tobacco Use - Medium Risk (08/13/2023)   Received from [facility]    Readmission Risk Interventions    02/16/2023    3:56 PM 08/24/2022   10:57 AM 05/20/2022   11:41 AM  Readmission Risk Prevention Plan  Transportation Screening Complete Complete Complete  Medication Review Oceanographer) Complete Complete Complete  PCP or Specialist appointment within 3-5 days of discharge Complete Complete Complete  HRI or Home Care Consult Complete Complete Complete  SW Recovery Care/Counseling Consult Not Complete    Palliative Care Screening Not Applicable  Complete  Skilled Nursing Facility Complete Complete Complete

## 2023-09-18 NOTE — Progress Notes (Signed)
 Patient report given to RN Mariam . All questions are answered via phone. No any questions at this time.

## 2023-09-18 NOTE — TOC Transition Note (Signed)
 Transition of Care [facility]) - Discharge Note   Patient Details  Name: Nicholas Weiss MRN: [id number] Date of Birth: 1950/11/17  Transition of Care [facility]) CM/SW Contact:  Rechy Bost E Torre [place], LCSW Phone Number: 09/18/2023, 10:09 AM   Clinical Narrative:    Discharge to [facility] today. Room 513. Confirmed with Admissions Workers [place] and [place]. MD has updated the significant other. Asked RN to call report. EMS paperwork completed. [facility] arranged, per Octaviano patient is 3rd on the list. RN notified.    Final next level of care: Skilled Nursing Facility Barriers to Discharge: Barriers Resolved   Patient Goals and CMS Choice            Discharge Placement              Patient chooses bed at: [facility] Patient to be transferred to facility by: [facility]      Discharge Plan and Services Additional resources added to the After Visit Summary for                                       Social Drivers of Health (SDOH) Interventions SDOH Screenings   Food Insecurity: No Food Insecurity (09/15/2023)  Housing: Low Risk  (09/15/2023)  Transportation Needs: No Transportation Needs (09/15/2023)  Utilities: Not At Risk (09/15/2023)  Alcohol Screen: Low Risk  (10/02/2020)  Depression (PHQ2-9): Medium Risk (10/02/2020)  Financial Resource Strain: Low Risk  (05/19/2023)   Received from [facility]  Physical Activity: Insufficiently Active (01/01/2020)  Social Connections: Patient Declined (09/15/2023)  Stress: No Stress Concern Present (01/01/2020)  Tobacco Use: Low Risk  (09/14/2023)  Recent Concern: Tobacco Use - Medium Risk (08/13/2023)   Received from [facility]     Readmission Risk Interventions    02/16/2023    3:56 PM 08/24/2022   10:57 AM 05/20/2022   11:41 AM  Readmission Risk Prevention Plan  Transportation Screening Complete Complete Complete  Medication Review (RN Care Manager) Complete Complete Complete   PCP or Specialist appointment within 3-5 days of discharge Complete Complete Complete  HRI or Home Care Consult Complete Complete Complete  SW Recovery Care/Counseling Consult Not Complete    Palliative Care Screening Not Applicable  Complete  Skilled Nursing Facility Complete Complete Complete

## 2023-09-19 NOTE — TOC CM/SW Note (Signed)
 09/19/23 @ 9:35 am  [place] with [facility] asking if Polymast can be held while at [facility] and resumed when patient returns to the community. Checked with MD who states Polymast can be held while at [facility], requested DC Summary be updated to reflect this per Brittany's request.

## 2023-09-20 LAB — BPAM RBC
Blood Product Expiration Date: 202507122359
Blood Product Expiration Date: 202507132359
Blood Product Expiration Date: 202507132359
ISSUE DATE / TIME: 202506190938
Unit Type and Rh: 5100
Unit Type and Rh: 5100

## 2023-09-20 LAB — TYPE AND SCREEN
ABO/RH(D): A POS
Antibody Screen: POSITIVE
DAT, IgG: NEGATIVE
DAT, complement: NEGATIVE
Unit division: 0
Unit division: 0

## 2023-09-20 NOTE — Procedures (Signed)
 Patient Name: Nicholas Weiss  MRN: [id number]  Epilepsy Attending: Arlin MALVA Krebs  Referring Physician/Provider: Niu, Xilin, MD  Date: 09/15/2023  Duration: 24.11 mins  Patient history: 73yo M with h/o seizures came with syncope. EEG to evaluate for seizure   Level of alertness: Awake  AEDs during EEG study: None  Technical aspects: This EEG study was done with scalp electrodes positioned according to the 10-20 International system of electrode placement. Electrical activity was reviewed with band pass filter of 1-70Hz , sensitivity of 7 uV/mm, display speed of 55mm/sec with a 60Hz  notched filter applied as appropriate. EEG data were recorded continuously and digitally stored.  Video monitoring was available and reviewed as appropriate.  Description: EG showed continuous generalized 5 to 7 Hz theta slowing admixed with intermittent generalized 2-3 Hz delta slowing, at times with triphasic morphology. Hyperventilation and photic stimulation were not performed.      ABNORMALITY - Continuous slow, generalized   IMPRESSION: This study is suggestive of moderate diffuse encephalopathy. No seizures or epileptiform discharges were seen throughout the recording.   Eleonore Shippee O Myer Bohlman

## 2023-09-21 ENCOUNTER — Ambulatory Visit (INDEPENDENT_AMBULATORY_CARE_PROVIDER_SITE_OTHER): Admitting: Physician Assistant

## 2023-09-21 VITALS — BP 122/78 | Ht 70.0 in | Wt 166.0 lb

## 2023-09-21 DIAGNOSIS — S065XAD Traumatic subdural hemorrhage with loss of consciousness status unknown, subsequent encounter: Secondary | ICD-10-CM

## 2023-09-21 DIAGNOSIS — S065XAA Traumatic subdural hemorrhage with loss of consciousness status unknown, initial encounter: Secondary | ICD-10-CM

## 2023-09-21 DIAGNOSIS — S42202D Unspecified fracture of upper end of left humerus, subsequent encounter for fracture with routine healing: Secondary | ICD-10-CM

## 2023-09-23 ENCOUNTER — Emergency Department
Admission: EM | Admit: 2023-09-23 | Discharge: 2023-09-24 | Disposition: A | Discharge status: Skilled Nursing Facility | Attending: Emergency Medicine | Admitting: Emergency Medicine

## 2023-09-23 ENCOUNTER — Emergency Department

## 2023-09-23 ENCOUNTER — Other Ambulatory Visit: Payer: Self-pay

## 2023-09-23 DIAGNOSIS — E1065 Type 1 diabetes mellitus with hyperglycemia: Secondary | ICD-10-CM | POA: Insufficient documentation

## 2023-09-23 DIAGNOSIS — Y92129 Unspecified place in nursing home as the place of occurrence of the external cause: Secondary | ICD-10-CM | POA: Insufficient documentation

## 2023-09-23 DIAGNOSIS — N183 Chronic kidney disease, stage 3 unspecified: Secondary | ICD-10-CM | POA: Diagnosis not present

## 2023-09-23 DIAGNOSIS — W19XXXA Unspecified fall, initial encounter: Secondary | ICD-10-CM | POA: Insufficient documentation

## 2023-09-23 DIAGNOSIS — R739 Hyperglycemia, unspecified: Secondary | ICD-10-CM | POA: Diagnosis present

## 2023-09-23 DIAGNOSIS — Z7901 Long term (current) use of anticoagulants: Secondary | ICD-10-CM | POA: Insufficient documentation

## 2023-09-23 DIAGNOSIS — Z8673 Personal history of transient ischemic attack (TIA), and cerebral infarction without residual deficits: Secondary | ICD-10-CM | POA: Insufficient documentation

## 2023-09-23 DIAGNOSIS — M7989 Other specified soft tissue disorders: Secondary | ICD-10-CM | POA: Insufficient documentation

## 2023-09-23 DIAGNOSIS — E1022 Type 1 diabetes mellitus with diabetic chronic kidney disease: Secondary | ICD-10-CM | POA: Diagnosis not present

## 2023-09-23 LAB — COMPREHENSIVE METABOLIC PANEL WITH GFR
ALT: 19 U/L (ref 0–44)
AST: 26 U/L (ref 15–41)
Albumin: 3 g/dL — ABNORMAL LOW (ref 3.5–5.0)
Alkaline Phosphatase: 73 U/L (ref 38–126)
Anion gap: 8 (ref 5–15)
BUN: 30 mg/dL — ABNORMAL HIGH (ref 8–23)
CO2: 28 mmol/L (ref 22–32)
Calcium: 8.8 mg/dL — ABNORMAL LOW (ref 8.9–10.3)
Chloride: 100 mmol/L (ref 98–111)
Creatinine, Ser: 1.62 mg/dL — ABNORMAL HIGH (ref 0.61–1.24)
GFR, Estimated: 45 mL/min — ABNORMAL LOW (ref >60)
Glucose, Bld: 387 mg/dL — ABNORMAL HIGH (ref 70–99)
Potassium: 4.4 mmol/L (ref 3.5–5.1)
Sodium: 136 mmol/L (ref 135–145)
Total Bilirubin: 0.8 mg/dL (ref 0.0–1.2)
Total Protein: 8.3 g/dL — ABNORMAL HIGH (ref 6.5–8.1)

## 2023-09-23 LAB — CBC WITH DIFFERENTIAL/PLATELET
Abs Immature Granulocytes: 0.01 10*3/uL (ref 0.00–0.07)
Basophils Absolute: 0 10*3/uL (ref 0.0–0.1)
Basophils Relative: 1 %
Eosinophils Absolute: 0.1 10*3/uL (ref 0.0–0.5)
Eosinophils Relative: 2 %
HCT: 32.9 % — ABNORMAL LOW (ref 39.0–52.0)
Hemoglobin: 10.4 g/dL — ABNORMAL LOW (ref 13.0–17.0)
Immature Granulocytes: 0 %
Lymphocytes Relative: 28 %
Lymphs Abs: 0.9 10*3/uL (ref 0.7–4.0)
MCH: 30.1 pg (ref 26.0–34.0)
MCHC: 31.6 g/dL (ref 30.0–36.0)
MCV: 95.1 fL (ref 80.0–100.0)
Monocytes Absolute: 0.5 10*3/uL (ref 0.1–1.0)
Monocytes Relative: 15 %
Neutro Abs: 1.7 10*3/uL (ref 1.7–7.7)
Neutrophils Relative %: 54 %
Platelets: 284 10*3/uL (ref 150–400)
RBC: 3.46 MIL/uL — ABNORMAL LOW (ref 4.22–5.81)
RDW: 17 % — ABNORMAL HIGH (ref 11.5–15.5)
WBC: 3.1 10*3/uL — ABNORMAL LOW (ref 4.0–10.5)
nRBC: 0 % (ref 0.0–0.2)

## 2023-09-23 LAB — CBG MONITORING, ED
Glucose-Capillary: 411 mg/dL — ABNORMAL HIGH (ref 70–99)
Glucose-Capillary: 190 mg/dL — ABNORMAL HIGH (ref 70–99)

## 2023-09-23 LAB — MAGNESIUM: Magnesium: 1.8 mg/dL (ref 1.7–2.4)

## 2023-09-23 LAB — BETA-HYDROXYBUTYRIC ACID: Beta-Hydroxybutyric Acid: 0.13 mmol/L (ref 0.05–0.27)

## 2023-09-23 LAB — BLOOD GAS, VENOUS

## 2023-09-23 MED ORDER — LACTATED RINGERS IV BOLUS
1000.0000 mL | Freq: Once | INTRAVENOUS | Status: AC
  Administered 2023-09-23: 1000 mL via INTRAVENOUS

## 2023-09-23 MED ORDER — INSULIN ASPART 100 UNIT/ML IJ SOLN
10.0000 [IU] | Freq: Once | INTRAMUSCULAR | Status: AC
  Administered 2023-09-23: 10 [IU] via INTRAVENOUS
  Filled 2023-09-23: qty 1

## 2023-09-23 NOTE — ED Notes (Signed)
 Multiple attempts to obtain urine without success. Urinal at the bedside. Patient encouraged. Abdomen soft. MD notified.

## 2023-09-23 NOTE — ED Provider Notes (Signed)
 [facility] Provider Note    Event Date/Time   First MD Initiated Contact with Patient 09/23/23 1627     (approximate)   History   Hyperglycemia   HPI  Nicholas Weiss is a 73 y.o. male who presents to the ED for evaluation of Hyperglycemia   Review 6/21 medical DC summary.  Type I diabetic with history of recurrent DKA, cocaine abuse, stroke, CKD 3, recurrent falls and emesis.  Was on Eliquis  due to atrial fibrillation but this has been held since admission 5/30 - 6/3 for SDH.  Received 1 unit of PRBC during this recent admission, discharged with hemoglobin 10.8  Patient presents to the ED from his local SNF for evaluation of a reported fall.  Patient reports he does not recall any fall and that he is fine, has no symptoms or complaints.   Reports left arm pain with movement, he does not arrive with any sort of sling in place.   Physical Exam   Triage Vital Signs: ED Triage Vitals  Encounter Vitals Group     BP      Girls Systolic BP Percentile      Girls Diastolic BP Percentile      Boys Systolic BP Percentile      Boys Diastolic BP Percentile      Pulse      Resp      Temp      Temp src      SpO2      Weight      Height      Head Circumference      Peak Flow      Pain Score      Pain Loc      Pain Education      Exclude from Growth Chart     Most recent vital signs: Vitals:   09/23/23 1900 09/23/23 2000  BP: (!) 161/99 (!) 158/91  Pulse: 70 72  Resp: 15 11  Temp:    SpO2: 100% 100%    General: Awake, no distress.  CV:  Good peripheral perfusion.  Resp:  Normal effort.  Abd:  No distention.  Soft MSK:  No deformity noted.  Aside from his left humerus, noted below, no signs of deformity, tenderness or trauma Neuro:  No focal deficits appreciated. Other:  Soft tissue swelling to the left humerus without evidence of open injury.  Left hand is well-perfused without neurologic deficits.   ED Results / Procedures / Treatments    Labs (all labs ordered are listed, but only abnormal results are displayed) Labs Reviewed  BLOOD GAS, VENOUS - Abnormal; Notable for the following components:      Result Value   pCO2, Ven 61 (*)    pO2, Ven <31 (*)    Bicarbonate 33.7 (*)    Acid-Base Excess 6.1 (*)    All other components within normal limits  COMPREHENSIVE METABOLIC PANEL WITH GFR - Abnormal; Notable for the following components:   Glucose, Bld 387 (*)    BUN 30 (*)    Creatinine, Ser 1.62 (*)    Calcium  8.8 (*)    Total Protein 8.3 (*)    Albumin 3.0 (*)    GFR, Estimated 45 (*)    All other components within normal limits  CBC WITH DIFFERENTIAL/PLATELET - Abnormal; Notable for the following components:   WBC 3.1 (*)    RBC 3.46 (*)    Hemoglobin 10.4 (*)    HCT 32.9 (*)  RDW 17.0 (*)    All other components within normal limits  CBG MONITORING, ED - Abnormal; Notable for the following components:   Glucose-Capillary 411 (*)    All other components within normal limits  CBG MONITORING, ED - Abnormal; Notable for the following components:   Glucose-Capillary 190 (*)    All other components within normal limits  BETA-HYDROXYBUTYRIC ACID  MAGNESIUM   URINALYSIS, ROUTINE W REFLEX MICROSCOPIC    EKG Sinus rhythm with a rate of 82 bpm.  Prolonged PR interval without high-grade block.  No STEMI.  RADIOLOGY Plain films of the left humerus interpreted by me with similar displaced fracture as previously noted CXR interpreted by me without evidence of acute cardiopulmonary pathology.  Official radiology report(s): DG Humerus Left Result Date: 09/23/2023 CLINICAL DATA:  fall, eval change in recent fx EXAM: LEFT HUMERUS - 2+ VIEW COMPARISON:  September 15, 2023 FINDINGS: Similarly appearing, mildly comminuted, obliquely oriented fracture of the proximal left humerus.No dislocation. There is no evidence of arthropathy or other focal bone abnormality. Soft tissues are unremarkable. IMPRESSION: Similarly appearing,  mildly comminuted and displaced fracture of the proximal left humerus. Electronically Signed   By: Rogelia Myers M.D.   On: 09/23/2023 17:00   DG Chest Portable 1 View Result Date: 09/23/2023 CLINICAL DATA:  recent fall, acute hyperglycemia. eval ptx, ribs, infiltrate EXAM: PORTABLE CHEST - 1 VIEW COMPARISON:  Aug 15, 2023 FINDINGS: Partial obscuration of the right lung apex due to patient's chin. No focal airspace consolidation, pleural effusion, or pneumothorax. No cardiomegaly. Aortic atherosclerosis. No acute fracture or destructive lesion. Multilevel thoracic osteophytosis. IMPRESSION: No acute cardiopulmonary abnormality. Electronically Signed   By: Rogelia Myers M.D.   On: 09/23/2023 16:59    PROCEDURES and INTERVENTIONS:  .1-3 Lead EKG Interpretation  Performed by: Claudene Rover, MD Authorized by: Claudene Rover, MD     Interpretation: normal     ECG rate:  70   ECG rate assessment: normal     Rhythm: sinus rhythm     Ectopy: none     Conduction: normal   .Critical Care  Performed by: Claudene Rover, MD Authorized by: Claudene Rover, MD   Critical care provider statement:    Critical care time (minutes):  30   Critical care was necessary to treat or prevent imminent or life-threatening deterioration of the following conditions:  Endocrine crisis   Critical care was time spent personally by me on the following activities:  Development of treatment plan with patient or surrogate, discussions with consultants, evaluation of patient's response to treatment, examination of patient, ordering and review of laboratory studies, ordering and review of radiographic studies, ordering and performing treatments and interventions, pulse oximetry, re-evaluation of patient's condition and review of old charts   Medications  lactated ringers  bolus 1,000 mL (0 mLs Intravenous Stopped 09/23/23 1901)  insulin  aspart (novoLOG ) injection 10 Units (10 Units Intravenous Given 09/23/23 1811)      IMPRESSION / MDM / ASSESSMENT AND PLAN / ED COURSE  I reviewed the triage vital signs and the nursing notes.  Differential diagnosis includes, but is not limited to, DKA, HHS, fracture, dislocation, medication noncompliance  {Patient presents with symptoms of an acute illness or injury that is potentially life-threatening.  Patient presents to the ED well-appearing and asymptomatic with hyperglycemia.  Reported fall at her facility but no signs of trauma or injury on exam or imaging.  Known left humerus fracture and we put a fresh sling on it he does not currently have  one with him.  Hyperglycemia but no acidosis.  Doubt HHS as he is well-appearing and asymptomatic.  Renal function around baseline.  Hemoglobin around baseline from recent admission.  After fluids and IV insulin  glucose improves dramatically he still feels well so we will return him to his facility.  Clinical Course as of 09/23/23 2118  Thu Sep 23, 2023  1913 Reassessed.  Feeling well.  Pending UA but otherwise reassuring workup with hyperglycemia improving with fluids and insulin .  No signs of DKA. [DS]    Clinical Course User Index [DS] Claudene Rover, MD     FINAL CLINICAL IMPRESSION(S) / ED DIAGNOSES   Final diagnoses:  Hyperglycemia     Rx / DC Orders   ED Discharge Orders     None        Note:  This document was prepared using Dragon voice recognition software and may include unintentional dictation errors.   Claudene Rover, MD 09/23/23 2120

## 2023-09-23 NOTE — ED Notes (Signed)
 CCMD notified of monitoring order

## 2023-09-23 NOTE — ED Triage Notes (Signed)
 Pt here via [facility] from [facility] with CC of fall, pt states he does not remember the fall but per EMS pt is confused at baseline. Pt is able to tell this RN his full, name DOB, and current place.   Per EMS BSG Is [address], pt states he takes insulin  just about everyday.   Pt denies pain.

## 2023-09-26 LAB — BLOOD GAS, VENOUS
Acid-Base Excess: 6.1 mmol/L — ABNORMAL HIGH (ref 0.0–2.0)
Bicarbonate: 33.7 mmol/L — ABNORMAL HIGH (ref 20.0–28.0)
O2 Saturation: 24.4 %
Patient temperature: 37
pCO2, Ven: 61 mmHg — ABNORMAL HIGH (ref 44–60)
pH, Ven: 7.35 (ref 7.25–7.43)

## 2023-10-03 ENCOUNTER — Ambulatory Visit: Payer: Self-pay | Admitting: Oncology

## 2023-10-04 ENCOUNTER — Encounter: Payer: Self-pay | Admitting: Oncology

## 2023-10-08 ENCOUNTER — Inpatient Hospital Stay

## 2023-10-08 ENCOUNTER — Other Ambulatory Visit: Payer: Self-pay | Admitting: Nurse Practitioner

## 2023-10-08 ENCOUNTER — Encounter: Payer: Self-pay | Admitting: Oncology

## 2023-10-08 ENCOUNTER — Inpatient Hospital Stay: Admitting: Nurse Practitioner

## 2023-10-08 DIAGNOSIS — C9000 Multiple myeloma not having achieved remission: Secondary | ICD-10-CM

## 2023-10-12 ENCOUNTER — Other Ambulatory Visit: Payer: Self-pay

## 2023-10-13 ENCOUNTER — Other Ambulatory Visit: Payer: Self-pay

## 2023-10-18 ENCOUNTER — Telehealth: Payer: Self-pay | Admitting: *Deleted

## 2023-10-18 ENCOUNTER — Encounter: Payer: Self-pay | Admitting: Oncology

## 2023-10-18 ENCOUNTER — Other Ambulatory Visit: Payer: Self-pay

## 2023-10-18 DIAGNOSIS — C9000 Multiple myeloma not having achieved remission: Secondary | ICD-10-CM

## 2023-10-18 NOTE — Telephone Encounter (Signed)
 Spoke to caregiver [name] who confirmed patient is not in a facility and back at home at this time.  Informed caregiver will submit Pomalyst  refill for review; per Dr. Darold note on 09/10/23 He will continue to take Pomalyst  2 mg 3 weeks on 1 week off starting today.  Since last OV patient has been hospitalized for weakness, hyperglycemia and fracture post fall.  Informed caregiver Dr. Melanee is currently out of clinic and will forward to provider for review.  Also stated will follow up once a response is received.  Caregiver verbalized understanding.

## 2023-10-18 NOTE — Telephone Encounter (Signed)
 [name] a friend wants to know when he is going to start on the Pomalyst  his next appointment is August 13 the telephone number for [name] is [phone]

## 2023-10-19 ENCOUNTER — Encounter: Payer: Self-pay | Admitting: Oncology

## 2023-10-19 MED ORDER — POMALIDOMIDE 2 MG PO CAPS: 2.0000 mg | ORAL_CAPSULE | Freq: Every day | ORAL | 0 refills | Status: DC

## 2023-10-19 NOTE — Telephone Encounter (Signed)
 Outbound call spoke to [facility], informed Pomalyst  refill has been processed and that [facility] should be in touch shortly to coordinate delivery.  Spouse verbalized understanding; reiterated dose and instructions remain the same and to resume upon receiving.

## 2023-10-20 ENCOUNTER — Emergency Department
Admission: EM | Admit: 2023-10-20 | Discharge: 2023-10-20 | Disposition: A | Discharge status: Home / Self Care | Source: Ambulatory Visit | Attending: Emergency Medicine | Admitting: Emergency Medicine

## 2023-10-20 DIAGNOSIS — Z8673 Personal history of transient ischemic attack (TIA), and cerebral infarction without residual deficits: Secondary | ICD-10-CM | POA: Insufficient documentation

## 2023-10-20 DIAGNOSIS — N189 Chronic kidney disease, unspecified: Secondary | ICD-10-CM | POA: Insufficient documentation

## 2023-10-20 DIAGNOSIS — E1122 Type 2 diabetes mellitus with diabetic chronic kidney disease: Secondary | ICD-10-CM | POA: Diagnosis not present

## 2023-10-20 DIAGNOSIS — G35 Multiple sclerosis: Secondary | ICD-10-CM | POA: Insufficient documentation

## 2023-10-20 DIAGNOSIS — E1165 Type 2 diabetes mellitus with hyperglycemia: Secondary | ICD-10-CM | POA: Insufficient documentation

## 2023-10-20 DIAGNOSIS — I959 Hypotension, unspecified: Secondary | ICD-10-CM | POA: Diagnosis present

## 2023-10-20 DIAGNOSIS — R531 Weakness: Secondary | ICD-10-CM | POA: Diagnosis not present

## 2023-10-20 LAB — URINALYSIS, ROUTINE W REFLEX MICROSCOPIC
Bacteria, UA: NONE SEEN
Bilirubin Urine: NEGATIVE
Glucose, UA: >=500 mg/dL — AB
Hgb urine dipstick: NEGATIVE
Ketones, ur: 5 mg/dL — AB
Leukocytes,Ua: NEGATIVE
Nitrite: NEGATIVE
Protein, ur: NEGATIVE mg/dL
RBC / HPF: 0 RBC/hpf (ref 0–5)
Specific Gravity, Urine: 1.012 (ref 1.005–1.030)
Squamous Epithelial / HPF: 0 /HPF (ref 0–5)
pH: 6 (ref 5.0–8.0)

## 2023-10-20 LAB — CBC
HCT: 25.5 % — ABNORMAL LOW (ref 39.0–52.0)
Hemoglobin: 8.2 g/dL — ABNORMAL LOW (ref 13.0–17.0)
MCH: 31.5 pg (ref 26.0–34.0)
MCHC: 32.2 g/dL (ref 30.0–36.0)
MCV: 98.1 fL (ref 80.0–100.0)
Platelets: 207 K/uL (ref 150–400)
RBC: 2.6 MIL/uL — ABNORMAL LOW (ref 4.22–5.81)
RDW: 17 % — ABNORMAL HIGH (ref 11.5–15.5)
WBC: 5 K/uL (ref 4.0–10.5)
nRBC: 0 % (ref 0.0–0.2)

## 2023-10-20 LAB — COMPREHENSIVE METABOLIC PANEL WITH GFR
ALT: 31 U/L (ref 0–44)
AST: 25 U/L (ref 15–41)
Albumin: 2.9 g/dL — ABNORMAL LOW (ref 3.5–5.0)
Alkaline Phosphatase: 70 U/L (ref 38–126)
Anion gap: 10 (ref 5–15)
BUN: 30 mg/dL — ABNORMAL HIGH (ref 8–23)
CO2: 24 mmol/L (ref 22–32)
Calcium: 8.6 mg/dL — ABNORMAL LOW (ref 8.9–10.3)
Chloride: 102 mmol/L (ref 98–111)
Creatinine, Ser: 1.57 mg/dL — ABNORMAL HIGH (ref 0.61–1.24)
GFR, Estimated: 47 mL/min — ABNORMAL LOW (ref >60)
Glucose, Bld: 282 mg/dL — ABNORMAL HIGH (ref 70–99)
Potassium: 4.9 mmol/L (ref 3.5–5.1)
Sodium: 136 mmol/L (ref 135–145)
Total Bilirubin: 0.7 mg/dL (ref 0.0–1.2)
Total Protein: 8.1 g/dL (ref 6.5–8.1)

## 2023-10-20 LAB — TROPONIN I (HIGH SENSITIVITY)
Troponin I (High Sensitivity): 18 ng/L — ABNORMAL HIGH (ref <18)
Troponin I (High Sensitivity): 15 ng/L (ref <18)

## 2023-10-20 LAB — MAGNESIUM: Magnesium: 1.6 mg/dL — ABNORMAL LOW (ref 1.7–2.4)

## 2023-10-20 LAB — CBG MONITORING, ED: Glucose-Capillary: 230 mg/dL — ABNORMAL HIGH (ref 70–99)

## 2023-10-20 MED ORDER — SODIUM CHLORIDE 0.9 % IV SOLN: Freq: Once | INTRAVENOUS | Status: AC

## 2023-10-20 MED ORDER — MAGNESIUM SULFATE 2 GM/50ML IV SOLN
2.0000 g | Freq: Once | INTRAVENOUS | Status: AC
  Administered 2023-10-20: 2 g via INTRAVENOUS
  Filled 2023-10-20: qty 50

## 2023-10-20 NOTE — ED Notes (Signed)
 Pt soiled. Pt ambulated to bathroom with walker and assistance. Reports he feels weaker than his baseline. Pt redressed in hospital scrubs. Assisted back to bed. Linen on bed changed at this time.

## 2023-10-20 NOTE — ED Triage Notes (Addendum)
 Pt c/o weakness, headache, hyperglycemia, and hypotension starting this morning.  Pain score 6/10.  Pt reports he has been taking medications as prescribed.  Denies n/v/d.    Pt's friend reports his insulin  pump ran out of insulin  last night.  Reports she changed it this morning.

## 2023-10-20 NOTE — ED Provider Notes (Signed)
 [facility] Provider Note    Event Date/Time   First MD Initiated Contact with Patient 10/20/23 1500     (approximate)   History   Chief Complaint Weakness, Hyperglycemia, and Hypotension   HPI  Nicholas Weiss is a 73 y.o. male with past medical history of diabetes, CKD, atrial fibrillation, stroke, MS, and orthostatic hypotension who presents to the ED complaining of weakness and hypotension.  Patient reports that he had a regular doctor's appointment earlier today, was feeling somewhat dizzy at that time and found to have a low BP in the 80s over 40s.  He denies any chest pain or shortness of breath, has not had any fevers, cough, nausea, vomiting, diarrhea, or dysuria.  He states that he has been eating and drinking normally, continues to take midodrine  as prescribed.  He does admit that his insulin  pump had ran out earlier this morning, but it has since been changed out and is currently functioning normally.  Since arriving to the ED, his BP is improved and he states that he feels back to normal with no complaints.     Physical Exam   Triage Vital Signs: ED Triage Vitals  Encounter Vitals Group     BP 10/20/23 1405 (!) 86/61     Girls Systolic BP Percentile --      Girls Diastolic BP Percentile --      Boys Systolic BP Percentile --      Boys Diastolic BP Percentile --      Pulse Rate 10/20/23 1405 86     Resp 10/20/23 1405 18     Temp 10/20/23 1405 98.1 F (36.7 C)     Temp Source 10/20/23 1405 Oral     SpO2 10/20/23 1405 100 %     Weight 10/20/23 1406 150 lb (68 kg)     Height 10/20/23 1406 5' 10 (1.778 m)     Head Circumference --      Peak Flow --      Pain Score 10/20/23 1406 6     Pain Loc --      Pain Education --      Exclude from Growth Chart --     Most recent vital signs: Vitals:   10/20/23 1510 10/20/23 1530  BP: (!) 150/95 132/84  Pulse: 79 80  Resp: (!) 0 16  Temp:    SpO2: 97% 100%    Constitutional: Alert and  oriented. Eyes: Conjunctivae are normal. Head: Atraumatic. Nose: No congestion/rhinnorhea. Mouth/Throat: Mucous membranes are moist.  Cardiovascular: Normal rate, regular rhythm. Grossly normal heart sounds.  2+ radial pulses bilaterally. Respiratory: Normal respiratory effort.  No retractions. Lungs CTAB. Gastrointestinal: Soft and nontender. No distention. Musculoskeletal: No lower extremity tenderness nor edema.  Neurologic:  Normal speech and language. No gross focal neurologic deficits are appreciated.    ED Results / Procedures / Treatments   Labs (all labs ordered are listed, but only abnormal results are displayed) Labs Reviewed  COMPREHENSIVE METABOLIC PANEL WITH GFR - Abnormal; Notable for the following components:      Result Value   Glucose, Bld 282 (*)    BUN 30 (*)    Creatinine, Ser 1.57 (*)    Calcium  8.6 (*)    Albumin 2.9 (*)    GFR, Estimated 47 (*)    All other components within normal limits  CBC - Abnormal; Notable for the following components:   RBC 2.60 (*)    Hemoglobin 8.2 (*)  HCT 25.5 (*)    RDW 17.0 (*)    All other components within normal limits  URINALYSIS, ROUTINE W REFLEX MICROSCOPIC - Abnormal; Notable for the following components:   Color, Urine YELLOW (*)    APPearance CLEAR (*)    Glucose, UA >=500 (*)    Ketones, ur 5 (*)    All other components within normal limits  MAGNESIUM  - Abnormal; Notable for the following components:   Magnesium  1.6 (*)    All other components within normal limits  CBG MONITORING, ED - Abnormal; Notable for the following components:   Glucose-Capillary 230 (*)    All other components within normal limits  TROPONIN I (HIGH SENSITIVITY) - Abnormal; Notable for the following components:   Troponin I (High Sensitivity) 18 (*)    All other components within normal limits  CBG MONITORING, ED  TROPONIN I (HIGH SENSITIVITY)     EKG  ED ECG REPORT I, Carlin Palin, the attending physician, personally  viewed and interpreted this ECG.   Date: 10/20/2023  EKG Time: 14:17  Rate: 84  Rhythm: normal sinus rhythm  Axis: Normal  Intervals:first-degree A-V block  and prolonged QT  ST&T Change: None  PROCEDURES:  Critical Care performed: No  Procedures   MEDICATIONS ORDERED IN ED: Medications  0.9 %  sodium chloride  infusion ( Intravenous New Bag/Given 10/20/23 1455)  magnesium  sulfate IVPB 2 g 50 mL (0 g Intravenous Stopped 10/20/23 1826)     IMPRESSION / MDM / ASSESSMENT AND PLAN / ED COURSE  I reviewed the triage vital signs and the nursing notes.                              73 y.o. male with past medical history of diabetes, atrial fibrillation, CKD, stroke, multiple sclerosis, and orthostatic hypotension who presents to the ED following episode of generalized weakness with hypotension at his physician's office earlier today.  Patient's presentation is most consistent with acute presentation with potential threat to life or bodily function.  Differential diagnosis includes, but is not limited to, arrhythmia, ACS, orthostatic hypotension, dehydration, electrolyte abnormality, AKI, sepsis, UTI.  Patient nontoxic-appearing and in no acute distress, vital signs are unremarkable with improvement in BP, do not appear concerning for sepsis and he denies any symptoms of infection.  EKG shows normal sinus rhythm with first-degree heart block and prolonged QT, similar to previous.  We will observe on cardiac monitor and screen troponin, labs thus far without significant leukocytosis, electrolyte abnormality, or AKI.  He does have some acute on chronic anemia, denies any recent bleeding and rectal exam shows light brown stool that is guaiac negative.  He is mildly hyperglycemic but labs do not appear concerning for DKA, will hydrate with IV fluids.  Additional labs show hypomagnesemia, which was repleted.  2 sets of troponin are stable and I doubt ACS.  Urinalysis shows no signs of infection  and his blood pressure remains reassuring here in the ED.  He is ambulatory with a walker, which is his baseline.  He is eager to be discharged home, was counseled to follow-up with his PCP and to return to the ED for new or worsening symptoms.  Patient agrees with plan.      FINAL CLINICAL IMPRESSION(S) / ED DIAGNOSES   Final diagnoses:  Generalized weakness  Hypotension, unspecified hypotension type  Hypomagnesemia     Rx / DC Orders   ED Discharge Orders  None        Note:  This document was prepared using Dragon voice recognition software and may include unintentional dictation errors.   Willo Dunnings, MD 10/20/23 [phone]

## 2023-11-02 ENCOUNTER — Telehealth: Payer: Self-pay

## 2023-11-02 NOTE — Telephone Encounter (Signed)
 [facility] calling to inform office that they are unable to contact patient to schedule Pomalyst  deliver.

## 2023-11-03 ENCOUNTER — Encounter: Payer: Self-pay | Admitting: Oncology

## 2023-11-05 ENCOUNTER — Other Ambulatory Visit

## 2023-11-05 ENCOUNTER — Ambulatory Visit

## 2023-11-05 ENCOUNTER — Ambulatory Visit: Admitting: Oncology

## 2023-11-08 ENCOUNTER — Other Ambulatory Visit: Payer: Self-pay

## 2023-11-08 DIAGNOSIS — N183 Chronic kidney disease, stage 3 unspecified: Secondary | ICD-10-CM

## 2023-11-10 ENCOUNTER — Encounter: Payer: Self-pay | Admitting: Oncology

## 2023-11-10 ENCOUNTER — Inpatient Hospital Stay: Attending: Oncology

## 2023-11-10 ENCOUNTER — Other Ambulatory Visit: Payer: Self-pay

## 2023-11-10 ENCOUNTER — Inpatient Hospital Stay: Admitting: Oncology

## 2023-11-10 ENCOUNTER — Inpatient Hospital Stay

## 2023-11-10 VITALS — BP 146/93 | HR 68 | Temp 97.0°F | Resp 18 | Ht 70.0 in | Wt 139.6 lb

## 2023-11-10 VITALS — BP 158/92 | HR 68

## 2023-11-10 DIAGNOSIS — Z794 Long term (current) use of insulin: Secondary | ICD-10-CM | POA: Diagnosis not present

## 2023-11-10 DIAGNOSIS — D631 Anemia in chronic kidney disease: Secondary | ICD-10-CM

## 2023-11-10 DIAGNOSIS — C9000 Multiple myeloma not having achieved remission: Secondary | ICD-10-CM

## 2023-11-10 DIAGNOSIS — Z5112 Encounter for antineoplastic immunotherapy: Secondary | ICD-10-CM | POA: Diagnosis present

## 2023-11-10 DIAGNOSIS — Z79624 Long term (current) use of inhibitors of nucleotide synthesis: Secondary | ICD-10-CM | POA: Insufficient documentation

## 2023-11-10 DIAGNOSIS — Z79899 Other long term (current) drug therapy: Secondary | ICD-10-CM

## 2023-11-10 DIAGNOSIS — Z7961 Long term (current) use of immunomodulator: Secondary | ICD-10-CM | POA: Insufficient documentation

## 2023-11-10 DIAGNOSIS — Z5111 Encounter for antineoplastic chemotherapy: Secondary | ICD-10-CM

## 2023-11-10 DIAGNOSIS — Z7969 Long term (current) use of other immunomodulators and immunosuppressants: Secondary | ICD-10-CM | POA: Diagnosis not present

## 2023-11-10 DIAGNOSIS — N1831 Chronic kidney disease, stage 3a: Secondary | ICD-10-CM | POA: Insufficient documentation

## 2023-11-10 DIAGNOSIS — N183 Chronic kidney disease, stage 3 unspecified: Secondary | ICD-10-CM

## 2023-11-10 DIAGNOSIS — D649 Anemia, unspecified: Secondary | ICD-10-CM | POA: Diagnosis not present

## 2023-11-10 DIAGNOSIS — Z7901 Long term (current) use of anticoagulants: Secondary | ICD-10-CM | POA: Insufficient documentation

## 2023-11-10 LAB — CBC WITH DIFFERENTIAL/PLATELET
Abs Immature Granulocytes: 0.01 K/uL (ref 0.00–0.07)
Basophils Absolute: 0 K/uL (ref 0.0–0.1)
Basophils Relative: 0 %
Eosinophils Absolute: 0.1 K/uL (ref 0.0–0.5)
Eosinophils Relative: 3 %
HCT: 25.7 % — ABNORMAL LOW (ref 39.0–52.0)
Hemoglobin: 8.4 g/dL — ABNORMAL LOW (ref 13.0–17.0)
Immature Granulocytes: 0 %
Lymphocytes Relative: 37 %
Lymphs Abs: 1.2 K/uL (ref 0.7–4.0)
MCH: 31.9 pg (ref 26.0–34.0)
MCHC: 32.7 g/dL (ref 30.0–36.0)
MCV: 97.7 fL (ref 80.0–100.0)
Monocytes Absolute: 0.3 K/uL (ref 0.1–1.0)
Monocytes Relative: 9 %
Neutro Abs: 1.6 K/uL — ABNORMAL LOW (ref 1.7–7.7)
Neutrophils Relative %: 51 %
Platelets: 262 K/uL (ref 150–400)
RBC: 2.63 MIL/uL — ABNORMAL LOW (ref 4.22–5.81)
RDW: 16.9 % — ABNORMAL HIGH (ref 11.5–15.5)
WBC: 3.2 K/uL — ABNORMAL LOW (ref 4.0–10.5)
nRBC: 0 % (ref 0.0–0.2)

## 2023-11-10 LAB — COMPREHENSIVE METABOLIC PANEL WITH GFR
ALT: 28 U/L (ref 0–44)
AST: 24 U/L (ref 15–41)
Albumin: 3.2 g/dL — ABNORMAL LOW (ref 3.5–5.0)
Alkaline Phosphatase: 72 U/L (ref 38–126)
Anion gap: 6 (ref 5–15)
BUN: 30 mg/dL — ABNORMAL HIGH (ref 8–23)
CO2: 28 mmol/L (ref 22–32)
Calcium: 8.9 mg/dL (ref 8.9–10.3)
Chloride: 99 mmol/L (ref 98–111)
Creatinine, Ser: 1.36 mg/dL — ABNORMAL HIGH (ref 0.61–1.24)
GFR, Estimated: 56 mL/min — ABNORMAL LOW (ref >60)
Glucose, Bld: 120 mg/dL — ABNORMAL HIGH (ref 70–99)
Potassium: 4.5 mmol/L (ref 3.5–5.1)
Sodium: 133 mmol/L — ABNORMAL LOW (ref 135–145)
Total Bilirubin: 0.7 mg/dL (ref 0.0–1.2)
Total Protein: 9 g/dL — ABNORMAL HIGH (ref 6.5–8.1)

## 2023-11-10 LAB — IRON AND TIBC
Iron: 62 ug/dL (ref 45–182)
Saturation Ratios: 29 % (ref 17.9–39.5)
TIBC: 214 ug/dL — ABNORMAL LOW (ref 250–450)
UIBC: 152 ug/dL

## 2023-11-10 LAB — SAMPLE TO BLOOD BANK

## 2023-11-10 LAB — FERRITIN: Ferritin: 286 ng/mL (ref 24–336)

## 2023-11-10 MED ORDER — DEXAMETHASONE 4 MG PO TABS
10.0000 mg | ORAL_TABLET | Freq: Once | ORAL | Status: AC
  Administered 2023-11-10 (×2): 10 mg via ORAL
  Filled 2023-11-10: qty 3

## 2023-11-10 MED ORDER — DARATUMUMAB-HYALURONIDASE-FIHJ 1800-30000 MG-UT/15ML ~~LOC~~ SOLN
1800.0000 mg | Freq: Once | SUBCUTANEOUS | Status: AC
  Administered 2023-11-10 (×2): 1800 mg via SUBCUTANEOUS
  Filled 2023-11-10: qty 15

## 2023-11-10 MED ORDER — EPOETIN ALFA-EPBX 40000 UNIT/ML IJ SOLN
40000.0000 [IU] | INTRAMUSCULAR | Status: DC
  Administered 2023-11-10 (×2): 40000 [IU] via SUBCUTANEOUS
  Filled 2023-11-10: qty 1

## 2023-11-10 MED ORDER — DIPHENHYDRAMINE HCL 25 MG PO CAPS
50.0000 mg | ORAL_CAPSULE | Freq: Once | ORAL | Status: AC
  Administered 2023-11-10 (×2): 50 mg via ORAL
  Filled 2023-11-10: qty 2

## 2023-11-10 MED ORDER — ACETAMINOPHEN 325 MG PO TABS
650.0000 mg | ORAL_TABLET | Freq: Once | ORAL | Status: AC
  Administered 2023-11-10 (×2): 650 mg via ORAL
  Filled 2023-11-10: qty 2

## 2023-11-10 NOTE — Telephone Encounter (Signed)
 Spoke to [place] informed of below and provided direct line. [name] stated she would call and arrange and will start Pomalyst  upon receiving.  No further questions at this time.

## 2023-11-10 NOTE — Progress Notes (Signed)
 Hematology/Oncology Consult note [facility]  [phone] [phone]  Patient Care Team: Alla Amis, MD as PCP - General (Family Medicine) [place], [facility] (Optometry) Melanee Annah BROCKS, MD as Consulting Physician (Oncology)   Name of the patient: Nicholas Weiss  [id number]  08/30/1950   Date of visit: 11/10/23  Diagnosis- high risk IgG lambda multiple myeloma R-ISS stage III     Chief complaint/ Reason for visit-on treatment assessment prior to cycle 11 of Darzalex   Heme/Onc history: Patient is a 73 year old African-American male with a past medical history significant for uncontrolled type 1 diabetes, chronic kidney disease stage III chronic normocytic anemia referred for abnormal SPEP. Patient's creatinine has been fluctuating between 1.5-2 but about 5 months ago it went up all the way to 5 and his blood sugars were in the 1000 range. More recently his kidney numbers have been drifting back to normal values. As a part of the work-up he had serum protein electrophoresis done which showed an elevated gammaglobulin fraction with an M spike of 3%. The amount of M spike was not quantified in the specimen. He has therefore been referred to us  for further management. Patient endorses chronic fatigue reports that his appetite and weight have remained stable   Results of blood work from 04/08/2020 were as follows: CBC showed white count of 2.9, H&H of 10.5/31.2 with an MCV of 91 and a platelet count of 191.  CMP showed a mildly elevated creatinine of 1.2 which was better as compared to 5 months ago when it was 3.9.  Total protein was mildly elevated at 9.1 calcium  normal at 8.4 ferritin and iron  studies B12 folate TSH and haptoglobin were normal.  Myeloma panel revealed an elevated IgG level of 06/04/2004 with an M protein of 3.1 g.  Immunofixation showed IgG lambda specificity.  Serum free light chain ratio was elevated at 25 and free light chain  lambda elevated at 370   Further myeloma work-up including a PET CT scan did not reveal any evidence of edematous lesions.  Bone marrow biopsy showed 23% plasma cells by manual count and 30% by CD138 IHC.  Normal cytogenetics.  FISH studies for myeloma showed gain of 1 q.  13 q-. detected.  P53 not detected.   He was treated for a year and has having smoldering multiple myeloma since both CKD and anemia have been stable for 3 to 4 years and could be secondary to uncontrolled diabetes.In June 2023 IgG levels increased to 5463 with an M protein of 4.1 g as compared to 2.6 g in September 2022.  Serum free light chain ratio remains around 25.  Therefore a repeat bone marrow biopsy was done which shows further increase in plasma cells ranging from 30 to 70% and by immunohistochemistry 60 to 70% of the cells were positive for CD138.  Cytogenetics normal.  FISH study showed 4: 14 translocation and gain of 1 q. making this high risk RISS stage III.  Repeat PET scan showed no lytic lesions   Patient is not a transplant candidate and was started on Velcade  Revlimid  dexamethasone  regimen in July 2023.  He was also referred to [facility] for second opinion but did not go.  Treatment complicated by repeated hospitalizations for uncontrolled blood sugars.  He has been frequently hospitalized thereby causing multiple interruptions in his treatment. Due to lack of adequate response to Vrd darzalex  was added in may 2024. M protein remained around 2 g despite adding Darzalex  to  the regimen and therefore plan to switch to Pomalyst  plus Darzalex  and stop Velcade  and Revlimid  altogether starting August 2024  Interval history-patient has not received treatment since May 2025 due to repeated hospitalizations.  He has had multiple falls and had a fracture of his left humeral neck secondary to it  ECOG PS- 2 Pain scale- 0   Review of systems- Review of Systems  Constitutional:  Positive for malaise/fatigue. Negative for chills,  fever and weight loss.  HENT:  Negative for congestion, ear discharge and nosebleeds.   Eyes:  Negative for blurred vision.  Respiratory:  Negative for cough, hemoptysis, sputum production, shortness of breath and wheezing.   Cardiovascular:  Negative for chest pain, palpitations, orthopnea and claudication.  Gastrointestinal:  Negative for abdominal pain, blood in stool, constipation, diarrhea, heartburn, melena, nausea and vomiting.  Genitourinary:  Negative for dysuria, flank pain, frequency, hematuria and urgency.  Musculoskeletal:  Negative for back pain, joint pain and myalgias.       Left arm pain  Skin:  Negative for rash.  Neurological:  Negative for dizziness, tingling, focal weakness, seizures, weakness and headaches.  Endo/Heme/Allergies:  Does not bruise/bleed easily.  Psychiatric/Behavioral:  Negative for depression and suicidal ideas. The patient does not have insomnia.       Allergies  Allergen Reactions   Penicillins Other (See Comments)    Childhood allergy -tolerated amoxil  03/2022 Unknown reaction Has patient had a PCN reaction causing immediate rash, facial/tongue/throat swelling, SOB or lightheadedness with hypotension: No Has patient had a PCN reaction causing severe rash involving mucus membranes or skin necrosis: No Has patient had a PCN reaction that required hospitalization No Has patient had a PCN reaction occurring within the last 10 years: No If all of the above answers are NO, then may proceed with Cephalosporin use.      Past Medical History:  Diagnosis Date   Acute metabolic encephalopathy 12/02/2020   DKA (diabetic ketoacidoses) 04/06/2016   Hypercholesteremia    Hypertension      Past Surgical History:  Procedure Laterality Date   AMPUTATION TOE Left 04/25/2022   Procedure: LEFT GREAT TOE AMPUTATION AND LEFT PARTIAL 2ND TOE AMPUTATION;  Surgeon: Janit Thresa HERO, DPM;  Location: [facility] ORS;  Service: Podiatry;  Laterality: Left;   COLONOSCOPY  WITH PROPOFOL  N/A 02/01/2020   Procedure: COLONOSCOPY WITH PROPOFOL ;  Surgeon: Unk Corinn Skiff, MD;  Location: [facility] ENDOSCOPY;  Service: Gastroenterology;  Laterality: N/A;   ESOPHAGOGASTRODUODENOSCOPY  02/01/2020   Procedure: ESOPHAGOGASTRODUODENOSCOPY (EGD);  Surgeon: Unk Corinn Skiff, MD;  Location[facility] ENDOSCOPY;  Service: Gastroenterology;;   ESOPHAGOGASTRODUODENOSCOPY (EGD) WITH PROPOFOL  N/A 06/07/2022   Procedure: ESOPHAGOGASTRODUODENOSCOPY (EGD) WITH PROPOFOL ;  Surgeon: Saintclair Jasper, MD;  Location: [facility] ENDOSCOPY;  Service: Gastroenterology;  Laterality: N/A;   KNEE SURGERY Right    Torn meniscus   KNEE SURGERY Left     Social History   Socioeconomic History   Marital status: Single    Spouse name: Not on file   Number of children: 3   Years of education: Not on file   Highest education level: High school graduate  Occupational History    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  Tobacco Use   Smoking status: Never   Smokeless tobacco: Never  Vaping Use   Vaping status: Never Used  Substance and Sexual Activity   Alcohol use: Not Currently    Alcohol/week: 0.0 - 1.0 standard drinks of alcohol    Comment: once every 2 months   Drug use: Yes  Types: Marijuana, Crack cocaine    Comment: last week   Sexual activity: Not Currently    Birth control/protection: None  Other Topics Concern   Not on file  Social History Narrative   Lives with girlfriend sharon   Social Drivers of Health   Financial Resource Strain: Low Risk  (10/20/2023)   Received from [facility]   Overall Financial Resource Strain (CARDIA)    Difficulty of Paying Living Expenses: Not hard at all  Food Insecurity: No Food Insecurity (10/20/2023)   Received from [facility]   Hunger Vital Sign    Within the past 12 months, you worried that your food would run out before you got the money to buy more.: Never true    Within the past 12 months, the food you bought just  didn't last and you didn't have money to get more.: Never true  Transportation Needs: No Transportation Needs (10/20/2023)   Received from [facility] - Transportation    In the past 12 months, has lack of transportation kept you from medical appointments or from getting medications?: No    Lack of Transportation (Non-Medical): No  Physical Activity: Insufficiently Active (01/01/2020)   Exercise Vital Sign    Days of Exercise per Week: 7 days    Minutes of Exercise per Session: 20 min  Stress: No Stress Concern Present (01/01/2020)   [employer] of Occupational Health - Occupational Stress Questionnaire    Feeling of Stress : Not at all  Social Connections: Patient Declined (09/15/2023)   Social Connection and Isolation Panel    Frequency of Communication with Friends and Family: Patient declined    Frequency of Social Gatherings with Friends and Family: Patient declined    Attends Religious Services: Patient declined    Database administrator or Organizations: Patient declined    Attends Banker Meetings: Patient declined    Marital Status: Patient declined  Intimate Partner Violence: Not At Risk (09/15/2023)   Humiliation, Afraid, Rape, and Kick questionnaire    Fear of Current or Ex-Partner: No    Emotionally Abused: No    Physically Abused: No    Sexually Abused: No    Family History  Problem Relation Age of Onset   Heart attack Father    Hypertension Sister    Cancer Sister      Current Outpatient Medications:    acetaminophen  (TYLENOL ) 325 MG tablet, Take 2 tablets (650 mg total) by mouth every 6 (six) hours as needed for mild pain (pain score 1-3) or fever., Disp: , Rfl:    acyclovir  (ZOVIRAX ) 400 MG tablet, Take 1 tablet (400 mg total) by mouth 2 (two) times daily., Disp: 60 tablet, Rfl: 2   apixaban  (ELIQUIS ) 5 MG TABS tablet, Take 5 mg by mouth 2 (two) times daily., Disp: , Rfl:    atorvastatin  (LIPITOR ) 80 MG tablet, Take 1  tablet (80 mg total) by mouth at bedtime. Hold while taking Paxlovid , Disp: 30 tablet, Rfl: 2   bisacodyl  5 MG EC tablet, Take 1 tablet (5 mg total) by mouth at bedtime as needed for mild constipation. Skip the dose if no constipation, Disp: , Rfl:    Continuous Glucose Sensor (DEXCOM G7 SENSOR) MISC, 1 each by Does not apply route 4 (four) times daily -  before meals and at bedtime., Disp: 1 each, Rfl: 0   fludrocortisone  (FLORINEF ) 0.1 MG tablet, Take 1 tablet (0.1 mg total) by mouth  daily as needed. Skip the dose if SBP >130, Disp: , Rfl:    gabapentin  (NEURONTIN ) 300 MG capsule, Take 1 capsule (300 mg total) by mouth 2 (two) times daily., Disp: 60 capsule, Rfl: 0   insulin  lispro (HUMALOG) 100 UNIT/ML injection, INJECT 150 UNITS EVERY 3 DAYS VIA INSULIN  PUMP AS DIRECTED, Disp: , Rfl:    iron  polysaccharides (NIFEREX) 150 MG capsule, Take 1 capsule (150 mg total) by mouth daily., Disp: , Rfl:    latanoprost  (XALATAN ) 0.005 % ophthalmic solution, 1 drop at bedtime., Disp: , Rfl:    levETIRAcetam  (KEPPRA ) 500 MG tablet, Take 1 tablet by mouth twice daily, Disp: 60 tablet, Rfl: 0   midodrine  (PROAMATINE ) 2.5 MG tablet, Take 2.5 mg by mouth 3 (three) times daily., Disp: , Rfl:    oxyCODONE -acetaminophen  (PERCOCET) 5-325 MG tablet, Take 1 tablet by mouth every 6 (six) hours as needed for severe pain (pain score 7-10)., Disp: 10 tablet, Rfl: 0   pomalidomide  (POMALYST ) 2 MG capsule, Take 1 capsule (2 mg total) by mouth daily. Take for 21 days, the hold for 7 days. Repeat every 28 days., Disp: 21 capsule, Rfl: 0   insulin  aspart (NOVOLOG ) 100 UNIT/ML injection, Inject 1 Units into the skin 3 (three) times daily with meals. If eating and Blood Glucose (BG) 80 or higher inject 1 units for meal coverage and add correction dose per scale. If not eating, correction dose only. BG <150= 0 unit; BG 150-200= 1 unit; BG 201-250= 2 unit; BG 251-300= 3 unit; BG 301-350= 4 unit; BG 351-400= 5 unit; BG >400= 6 unit and  Call Primary Care. (Patient not taking: Reported on 11/10/2023), Disp: , Rfl:    insulin  glargine-yfgn (SEMGLEE ) 100 UNIT/ML injection, Inject 0.04 mLs (4 Units total) into the skin daily., Disp: , Rfl:  No current facility-administered medications for this visit.  Facility-Administered Medications Ordered in Other Visits:    epoetin  alfa-epbx (RETACRIT ) injection 40,000 Units, 40,000 Units, Subcutaneous, Q21 days, Melanee Annah BROCKS, MD, 40,000 Units at 07/09/23 1153   epoetin  alfa-epbx (RETACRIT ) injection 40,000 Units, 40,000 Units, Subcutaneous, Q21 days, Melanee Annah BROCKS, MD, 40,000 Units at 11/10/23 1014  Physical exam:  Vitals:   11/10/23 0920  BP: (!) 146/93  Pulse: 68  Resp: 18  Temp: (!) 97 F (36.1 C)  TempSrc: Tympanic  SpO2: 100%  Weight: 139 lb 9.6 oz (63.3 kg)  Height: 5' 10 (1.778 m)   Physical Exam Constitutional:      Comments: Sitting in a wheelchair.  Frail appearing  Cardiovascular:     Rate and Rhythm: Normal rate and regular rhythm.     Heart sounds: Normal heart sounds.  Pulmonary:     Effort: Pulmonary effort is normal.     Breath sounds: Normal breath sounds.  Musculoskeletal:     Right lower leg: No edema.     Left lower leg: No edema.  Skin:    General: Skin is warm and dry.  Neurological:     Mental Status: He is alert and oriented to person, place, and time.      I have personally reviewed labs listed below:    Latest Ref Rng & Units 11/10/2023    8:32 AM  CMP  Glucose 70 - 99 mg/dL 879   BUN 8 - 23 mg/dL 30   Creatinine 9.38 - 1.24 mg/dL 8.63   Sodium 864 - 854 mmol/L 133   Potassium 3.5 - 5.1 mmol/L 4.5   Chloride 98 - 111 mmol/L  99   CO2 22 - 32 mmol/L 28   Calcium  8.9 - 10.3 mg/dL 8.9   Total Protein 6.5 - 8.1 g/dL 9.0   Total Bilirubin 0.0 - 1.2 mg/dL 0.7   Alkaline Phos 38 - 126 U/L 72   AST 15 - 41 U/L 24   ALT 0 - 44 U/L 28       Latest Ref Rng & Units 11/10/2023    8:32 AM  CBC  WBC 4.0 - 10.5 K/uL 3.2   Hemoglobin 13.0 -  17.0 g/dL 8.4   Hematocrit 60.9 - 52.0 % 25.7   Platelets 150 - 400 K/uL 262      Assessment and plan- Patient is a 73 y.o. male with history of IgG lambda multiple myeloma R-ISS stage III.  He is here for for on treatment assessment prior to cycle 11 of Darzalex   Patient was switched to Darzalex  Pomalyst  regimen in August 2024.  Since then his M protein as well as free light chains has been waxing and waning.  He has not had a great response to treatment but at the same time we have not seen overt progression either.  Labs from today are pending.  He also has repeated episodes of hospitalizations mainly due to gait imbalance from history of alcohol abuse.  He has brittle diabetes.  All these factors make his care challenging and switching him to carfilzomib weekly regimen may result in more treatment delays.  I am therefore continuing with present regimen.  Normocytic anemia: Secondary to CKD: Continue monthly retacrit .   He is on eliquis  for paroxysmal A-fib.  Given his recurrent falls this could be potentially risky.  I have asked him to hold off on taking his aspirin  while he is on Eliquis    Visit Diagnosis 1. Multiple myeloma not having achieved remission (HCC)   2. High risk medication use   3. Encounter for antineoplastic chemotherapy      Dr. Annah Skene, MD, MPH [facility] [phone] 11/10/2023 1:25 PM

## 2023-11-10 NOTE — Telephone Encounter (Signed)
 Patient came for follow up to clinic and mentioned he has not received his Pomalyst  as of yet.  Spoke to [employer] who indicated they've left 3 voice messages for the patient to return call and mentioned they left a message for [place] as well.  Representative provided direct phone number for patient to contact pharmacy [phone].

## 2023-11-10 NOTE — Progress Notes (Signed)
 Patient states he's doing okay. He fell about 2 weeks ago and has had multiple falls. Last fall resulted in him breaking his left arm.

## 2023-11-10 NOTE — Patient Instructions (Signed)
 [facility] - A DEPT OF [facility]  Discharge Instructions: Thank you for choosing [facility] to provide your oncology and hematology care.  If you have a lab appointment with the Cancer Center, please go directly to the Cancer Center and check in at the registration area.  Wear comfortable clothing and clothing appropriate for easy access to any Portacath or PICC line.   We strive to give you quality time with your provider. You may need to reschedule your appointment if you arrive late (15 or more minutes).  Arriving late affects you and other patients whose appointments are after yours.  Also, if you miss three or more appointments without notifying the office, you may be dismissed from the clinic at the provider's discretion.      For prescription refill requests, have your pharmacy contact our office and allow 72 hours for refills to be completed.    Today you received the following chemotherapy and/or immunotherapy agents :   daratumumab     To help prevent nausea and vomiting after your treatment, we encourage you to take your nausea medication as directed.  BELOW ARE SYMPTOMS THAT SHOULD BE REPORTED IMMEDIATELY: *FEVER GREATER THAN 100.4 F (38 C) OR HIGHER *CHILLS OR SWEATING *NAUSEA AND VOMITING THAT IS NOT CONTROLLED WITH YOUR NAUSEA MEDICATION *UNUSUAL SHORTNESS OF BREATH *UNUSUAL BRUISING OR BLEEDING *URINARY PROBLEMS (pain or burning when urinating, or frequent urination) *BOWEL PROBLEMS (unusual diarrhea, constipation, pain near the anus) TENDERNESS IN MOUTH AND THROAT WITH OR WITHOUT PRESENCE OF ULCERS (sore throat, sores in mouth, or a toothache) UNUSUAL RASH, SWELLING OR PAIN  UNUSUAL VAGINAL DISCHARGE OR ITCHING   Items with * indicate a potential emergency and should be followed up as soon as possible or go to the Emergency Department if any problems should occur.  Please show the CHEMOTHERAPY ALERT CARD or  IMMUNOTHERAPY ALERT CARD at check-in to the Emergency Department and triage nurse.  Should you have questions after your visit or need to cancel or reschedule your appointment, please contact [facility] - A DEPT OF [facility]  [phone] and follow the prompts.  Office hours are 8:00 a.m. to 4:30 p.m. Monday - Friday. Please note that voicemails left after 4:00 p.m. may not be returned until the following business day.  We are closed weekends and major holidays. You have access to a nurse at all times for urgent questions. Please call the main number to the clinic [phone] and follow the prompts.  For any non-urgent questions, you may also contact your provider using MyChart. We now offer e-Visits for anyone 25 and older to request care online for non-urgent symptoms. For details visit [email or web address].   Also download the MyChart app! Go to the app store, search MyChart, open the app, select [place], and log in with your MyChart username and password.

## 2023-11-11 ENCOUNTER — Emergency Department (HOSPITAL_COMMUNITY)

## 2023-11-11 ENCOUNTER — Encounter (HOSPITAL_COMMUNITY): Payer: Self-pay | Admitting: Internal Medicine

## 2023-11-11 ENCOUNTER — Other Ambulatory Visit: Payer: Self-pay

## 2023-11-11 ENCOUNTER — Observation Stay (HOSPITAL_COMMUNITY)
Admission: EM | Admit: 2023-11-11 | Discharge: 2023-11-13 | Disposition: A | Discharge status: Home / Self Care | Attending: Internal Medicine | Admitting: Internal Medicine

## 2023-11-11 ENCOUNTER — Inpatient Hospital Stay

## 2023-11-11 DIAGNOSIS — R739 Hyperglycemia, unspecified: Secondary | ICD-10-CM

## 2023-11-11 DIAGNOSIS — G40909 Epilepsy, unspecified, not intractable, without status epilepticus: Secondary | ICD-10-CM

## 2023-11-11 DIAGNOSIS — Z794 Long term (current) use of insulin: Secondary | ICD-10-CM | POA: Insufficient documentation

## 2023-11-11 DIAGNOSIS — E1100 Type 2 diabetes mellitus with hyperosmolarity without nonketotic hyperglycemic-hyperosmolar coma (NKHHC): Secondary | ICD-10-CM | POA: Diagnosis not present

## 2023-11-11 DIAGNOSIS — I129 Hypertensive chronic kidney disease with stage 1 through stage 4 chronic kidney disease, or unspecified chronic kidney disease: Secondary | ICD-10-CM | POA: Diagnosis not present

## 2023-11-11 DIAGNOSIS — S0093XA Contusion of unspecified part of head, initial encounter: Secondary | ICD-10-CM | POA: Diagnosis not present

## 2023-11-11 DIAGNOSIS — E871 Hypo-osmolality and hyponatremia: Secondary | ICD-10-CM | POA: Diagnosis not present

## 2023-11-11 DIAGNOSIS — G3184 Mild cognitive impairment, so stated: Secondary | ICD-10-CM | POA: Diagnosis present

## 2023-11-11 DIAGNOSIS — Z79899 Other long term (current) drug therapy: Secondary | ICD-10-CM | POA: Diagnosis not present

## 2023-11-11 DIAGNOSIS — E1065 Type 1 diabetes mellitus with hyperglycemia: Secondary | ICD-10-CM | POA: Insufficient documentation

## 2023-11-11 DIAGNOSIS — N183 Chronic kidney disease, stage 3 unspecified: Secondary | ICD-10-CM | POA: Diagnosis not present

## 2023-11-11 DIAGNOSIS — R296 Repeated falls: Secondary | ICD-10-CM | POA: Diagnosis present

## 2023-11-11 DIAGNOSIS — C9000 Multiple myeloma not having achieved remission: Secondary | ICD-10-CM | POA: Diagnosis present

## 2023-11-11 DIAGNOSIS — W19XXXA Unspecified fall, initial encounter: Secondary | ICD-10-CM | POA: Diagnosis not present

## 2023-11-11 DIAGNOSIS — I951 Orthostatic hypotension: Secondary | ICD-10-CM | POA: Diagnosis present

## 2023-11-11 DIAGNOSIS — E875 Hyperkalemia: Secondary | ICD-10-CM

## 2023-11-11 LAB — COMPREHENSIVE METABOLIC PANEL WITH GFR
ALT: 29 U/L (ref 0–44)
AST: 26 U/L (ref 15–41)
Albumin: 3.1 g/dL — ABNORMAL LOW (ref 3.5–5.0)
Alkaline Phosphatase: 72 U/L (ref 38–126)
Anion gap: 12 (ref 5–15)
BUN: 43 mg/dL — ABNORMAL HIGH (ref 8–23)
CO2: 21 mmol/L — ABNORMAL LOW (ref 22–32)
Calcium: 9.1 mg/dL (ref 8.9–10.3)
Chloride: 96 mmol/L — ABNORMAL LOW (ref 98–111)
Creatinine, Ser: 1.96 mg/dL — ABNORMAL HIGH (ref 0.61–1.24)
GFR, Estimated: 36 mL/min — ABNORMAL LOW (ref >60)
Glucose, Bld: 712 mg/dL (ref 70–99)
Potassium: 5.2 mmol/L — ABNORMAL HIGH (ref 3.5–5.1)
Sodium: 129 mmol/L — ABNORMAL LOW (ref 135–145)
Total Bilirubin: 1 mg/dL (ref 0.0–1.2)
Total Protein: 8.5 g/dL — ABNORMAL HIGH (ref 6.5–8.1)

## 2023-11-11 LAB — CBC WITH DIFFERENTIAL/PLATELET
Abs Immature Granulocytes: 0.02 K/uL (ref 0.00–0.07)
Basophils Absolute: 0 K/uL (ref 0.0–0.1)
Basophils Relative: 0 %
Eosinophils Absolute: 0 K/uL (ref 0.0–0.5)
Eosinophils Relative: 0 %
HCT: 30.5 % — ABNORMAL LOW (ref 39.0–52.0)
Hemoglobin: 9.7 g/dL — ABNORMAL LOW (ref 13.0–17.0)
Immature Granulocytes: 1 %
Lymphocytes Relative: 18 %
Lymphs Abs: 0.7 K/uL (ref 0.7–4.0)
MCH: 31.8 pg (ref 26.0–34.0)
MCHC: 31.8 g/dL (ref 30.0–36.0)
MCV: 100 fL (ref 80.0–100.0)
Monocytes Absolute: 0.6 K/uL (ref 0.1–1.0)
Monocytes Relative: 15 %
Neutro Abs: 2.7 K/uL (ref 1.7–7.7)
Neutrophils Relative %: 66 %
Platelets: 194 K/uL (ref 150–400)
RBC: 3.05 MIL/uL — ABNORMAL LOW (ref 4.22–5.81)
RDW: 17.1 % — ABNORMAL HIGH (ref 11.5–15.5)
WBC: 4.1 K/uL (ref 4.0–10.5)
nRBC: 0 % (ref 0.0–0.2)

## 2023-11-11 LAB — CBG MONITORING, ED
Glucose-Capillary: 577 mg/dL (ref 70–99)
Glucose-Capillary: 416 mg/dL — ABNORMAL HIGH (ref 70–99)
Glucose-Capillary: 324 mg/dL — ABNORMAL HIGH (ref 70–99)
Glucose-Capillary: 241 mg/dL — ABNORMAL HIGH (ref 70–99)
Glucose-Capillary: 180 mg/dL — ABNORMAL HIGH (ref 70–99)
Glucose-Capillary: 168 mg/dL — ABNORMAL HIGH (ref 70–99)

## 2023-11-11 LAB — URINALYSIS, COMPLETE (UACMP) WITH MICROSCOPIC
Bacteria, UA: NONE SEEN
Bilirubin Urine: NEGATIVE
Glucose, UA: >=500 mg/dL — AB
Hgb urine dipstick: NEGATIVE
Ketones, ur: 5 mg/dL — AB
Leukocytes,Ua: NEGATIVE
Nitrite: NEGATIVE
Protein, ur: NEGATIVE mg/dL
Specific Gravity, Urine: 1.018 (ref 1.005–1.030)
pH: 5 (ref 5.0–8.0)

## 2023-11-11 LAB — BASIC METABOLIC PANEL WITH GFR
Anion gap: 8 (ref 5–15)
BUN: 36 mg/dL — ABNORMAL HIGH (ref 8–23)
CO2: 25 mmol/L (ref 22–32)
Calcium: 8.9 mg/dL (ref 8.9–10.3)
Chloride: 103 mmol/L (ref 98–111)
Creatinine, Ser: 1.57 mg/dL — ABNORMAL HIGH (ref 0.61–1.24)
GFR, Estimated: 47 mL/min — ABNORMAL LOW (ref >60)
Glucose, Bld: 232 mg/dL — ABNORMAL HIGH (ref 70–99)
Potassium: 3.9 mmol/L (ref 3.5–5.1)
Sodium: 136 mmol/L (ref 135–145)

## 2023-11-11 LAB — KAPPA/LAMBDA LIGHT CHAINS
Kappa free light chain: 3.1 mg/L — ABNORMAL LOW (ref 3.3–19.4)
Kappa, lambda light chain ratio: 0.02 — ABNORMAL LOW (ref 0.26–1.65)
Lambda free light chains: 164.1 mg/L — ABNORMAL HIGH (ref 5.7–26.3)

## 2023-11-11 LAB — OSMOLALITY: Osmolality: 318 mosm/kg — ABNORMAL HIGH (ref 275–295)

## 2023-11-11 LAB — BETA-HYDROXYBUTYRIC ACID: Beta-Hydroxybutyric Acid: 2.3 mmol/L — ABNORMAL HIGH (ref 0.05–0.27)

## 2023-11-11 MED ORDER — LATANOPROST 0.005 % OP SOLN
1.0000 [drp] | Freq: Every day | OPHTHALMIC | Status: DC
  Administered 2023-11-12: 1 [drp] via OPHTHALMIC
  Filled 2023-11-11: qty 2.5

## 2023-11-11 MED ORDER — DEXTROSE IN LACTATED RINGERS 5 % IV SOLN: INTRAVENOUS | Status: DC

## 2023-11-11 MED ORDER — INSULIN REGULAR(HUMAN) IN NACL 100-0.9 UT/100ML-% IV SOLN
INTRAVENOUS | Status: DC
  Administered 2023-11-11: 1.1 [IU]/h via INTRAVENOUS

## 2023-11-11 MED ORDER — LACTATED RINGERS IV SOLN: INTRAVENOUS | Status: DC

## 2023-11-11 MED ORDER — INSULIN GLARGINE-YFGN 100 UNIT/ML ~~LOC~~ SOLN
6.0000 [IU] | Freq: Every day | SUBCUTANEOUS | Status: DC
  Administered 2023-11-11 – 2023-11-12 (×2): 6 [IU] via SUBCUTANEOUS
  Filled 2023-11-11 (×3): qty 0.06

## 2023-11-11 MED ORDER — APIXABAN 5 MG PO TABS
5.0000 mg | ORAL_TABLET | Freq: Two times a day (BID) | ORAL | Status: DC
  Administered 2023-11-11 – 2023-11-13 (×4): 5 mg via ORAL
  Filled 2023-11-11 (×4): qty 1

## 2023-11-11 MED ORDER — DEXTROSE 50 % IV SOLN: 0.0000 mL | INTRAVENOUS | Status: DC | PRN

## 2023-11-11 MED ORDER — MIDODRINE HCL 5 MG PO TABS: 2.5000 mg | ORAL_TABLET | Freq: Three times a day (TID) | ORAL | Status: DC

## 2023-11-11 MED ORDER — LEVETIRACETAM 250 MG PO TABS
500.0000 mg | ORAL_TABLET | Freq: Two times a day (BID) | ORAL | Status: DC
  Administered 2023-11-11 – 2023-11-13 (×4): 500 mg via ORAL
  Filled 2023-11-11: qty 1
  Filled 2023-11-11 (×3): qty 2

## 2023-11-11 MED ORDER — GABAPENTIN 300 MG PO CAPS
300.0000 mg | ORAL_CAPSULE | Freq: Two times a day (BID) | ORAL | Status: DC
  Administered 2023-11-12 – 2023-11-13 (×3): 300 mg via ORAL
  Filled 2023-11-11 (×3): qty 1

## 2023-11-11 MED ORDER — INSULIN ASPART 100 UNIT/ML IJ SOLN
0.0000 [IU] | Freq: Three times a day (TID) | INTRAMUSCULAR | Status: DC
  Administered 2023-11-12: 2 [IU] via SUBCUTANEOUS

## 2023-11-11 MED ORDER — ACYCLOVIR 400 MG PO TABS
400.0000 mg | ORAL_TABLET | Freq: Two times a day (BID) | ORAL | Status: DC
  Administered 2023-11-11 – 2023-11-13 (×4): 400 mg via ORAL
  Filled 2023-11-11 (×5): qty 1

## 2023-11-11 MED ORDER — ATORVASTATIN CALCIUM 80 MG PO TABS
80.0000 mg | ORAL_TABLET | Freq: Every day | ORAL | Status: DC
  Administered 2023-11-11 – 2023-11-12 (×2): 80 mg via ORAL
  Filled 2023-11-11: qty 1
  Filled 2023-11-11: qty 2

## 2023-11-11 MED ORDER — INSULIN REGULAR(HUMAN) IN NACL 100-0.9 UT/100ML-% IV SOLN
INTRAVENOUS | Status: DC
  Administered 2023-11-11: 6.5 [IU]/h via INTRAVENOUS
  Filled 2023-11-11: qty 100

## 2023-11-11 MED ORDER — SODIUM CHLORIDE 0.9 % IV BOLUS
1000.0000 mL | Freq: Once | INTRAVENOUS | Status: AC
Start: 2023-11-11 — End: 2023-11-11
  Administered 2023-11-11: 1000 mL via INTRAVENOUS

## 2023-11-11 NOTE — ED Notes (Signed)
 CCMD called, pt on monitor

## 2023-11-11 NOTE — ED Notes (Signed)
 Pt PIV from EMS found removed in pt's brief. Site clean, dry, and intact. Attempted PIV x2 unsuccessfully.

## 2023-11-11 NOTE — H&P (Signed)
 History and Physical    Nicholas Weiss FMW:[id number] DOB: [birth year] DOA: 11/11/2023  Patient coming from: Home.  Chief Complaint: Fall.  HPI: Nicholas Weiss is a 73 y.o. male with history of diabetes mellitus type 1 on insulin  pump last hemoglobin A1c was 8.6 on October 25, 2023, chronic orthostatic hypotension, paroxysmal atrial fibrillation, seizures, mild cognitive impairment for which family is planning to follow-up with neurologist in the next few weeks was brought to the ER after patient had a fall today.  As per the patient's son and wife with whom I spoke patient fell when he was trying to get up.  Has been having recurrent falls was admitted in June 2025 for subdural hematoma after fall.  Also recently had fractured his left upper extremity which is being managed conservatively and he wears a sling.  Patient's wife states that patient's insulin  pump got disconnected last night and was only recognized this morning and was placed back but by the time the patient reached ER his sugars were high.  Patient did not have any nausea vomiting chest pain shortness of breath.  ED Course: In the ER CT head and C-spine was unremarkable.  Patient mentation is at baseline.  Labs show blood sugar of 712 anion gap of 12.  Sodium is 129 potassium 5.2 creatinine 1.9.  UA is showing ketones of only 5.  Was started on fluid bolus and insulin  infusion for hyperosmolar status admitted for further workup.  Review of Systems: As per HPI, rest all negative.   Past Medical History:  Diagnosis Date   Acute metabolic encephalopathy 12/02/2020   DKA (diabetic ketoacidoses) 04/06/2016   Hypercholesteremia    Hypertension     Past Surgical History:  Procedure Laterality Date   AMPUTATION TOE Left 04/25/2022   Procedure: LEFT GREAT TOE AMPUTATION AND LEFT PARTIAL 2ND TOE AMPUTATION;  Surgeon: Janit Thresa HERO, DPM;  Location: [facility] ORS;  Service: Podiatry;  Laterality: Left;   COLONOSCOPY WITH PROPOFOL  N/A  02/01/2020   Procedure: COLONOSCOPY WITH PROPOFOL ;  Surgeon: Unk Corinn Skiff, MD;  Location: [facility] ENDOSCOPY;  Service: Gastroenterology;  Laterality: N/A;   ESOPHAGOGASTRODUODENOSCOPY  02/01/2020   Procedure: ESOPHAGOGASTRODUODENOSCOPY (EGD);  Surgeon: Unk Corinn Skiff, MD;  Location: [facility] ENDOSCOPY;  Service: Gastroenterology;;   ESOPHAGOGASTRODUODENOSCOPY (EGD) WITH PROPOFOL  N/A 06/07/2022   Procedure: ESOPHAGOGASTRODUODENOSCOPY (EGD) WITH PROPOFOL ;  Surgeon: Saintclair Jasper, MD;  Location: [facility] ENDOSCOPY;  Service: Gastroenterology;  Laterality: N/A;   KNEE SURGERY Right    Torn meniscus   KNEE SURGERY Left      reports that he has never smoked. He has never used smokeless tobacco. He reports that he does not currently use alcohol. He reports current drug use. Drugs: Marijuana and Crack cocaine.  Allergies  Allergen Reactions   Penicillins Other (See Comments)    Childhood allergy -tolerated amoxil  03/2022 Unknown reaction Has patient had a PCN reaction causing immediate rash, facial/tongue/throat swelling, SOB or lightheadedness with hypotension: No Has patient had a PCN reaction causing severe rash involving mucus membranes or skin necrosis: No Has patient had a PCN reaction that required hospitalization No Has patient had a PCN reaction occurring within the last 10 years: No If all of the above answers are NO, then may proceed with Cephalosporin use.     Family History  Problem Relation Age of Onset   Heart attack Father    Hypertension Sister    Cancer Sister     Prior to Admission medications   Medication Sig Start Date  End Date Taking? Authorizing Provider  acetaminophen  (TYLENOL ) 325 MG tablet Take 2 tablets (650 mg total) by mouth every 6 (six) hours as needed for mild pain (pain score 1-3) or fever. 09/17/23  Yes Wieting, Richard, MD  acyclovir  (ZOVIRAX ) 400 MG tablet Take 1 tablet (400 mg total) by mouth 2 (two) times daily. 08/13/23 11/11/23 Yes Melanee Annah BROCKS, MD   apixaban  (ELIQUIS ) 5 MG TABS tablet Take 5 mg by mouth 2 (two) times daily. 10/20/23  Yes [provider]  atorvastatin  (LIPITOR ) 80 MG tablet Take 1 tablet (80 mg total) by mouth at bedtime. Hold while taking Paxlovid  04/08/22  Yes Amin, Sumayya, MD  fludrocortisone  (FLORINEF ) 0.1 MG tablet Take 1 tablet (0.1 mg total) by mouth daily as needed. Skip the dose if SBP >130 09/17/23  Yes Wieting, Richard, MD  gabapentin  (NEURONTIN ) 300 MG capsule Take 1 capsule (300 mg total) by mouth 2 (two) times daily. 09/17/23  Yes Wieting, Richard, MD  insulin  lispro (HUMALOG) 100 UNIT/ML injection INJECT 150 UNITS EVERY 3 DAYS VIA INSULIN  PUMP AS DIRECTED 10/26/23  Yes [provider]  latanoprost  (XALATAN ) 0.005 % ophthalmic solution 1 drop at bedtime. 11/02/23  Yes [provider]  levETIRAcetam  (KEPPRA ) 500 MG tablet Take 1 tablet by mouth twice daily 09/09/23  Yes Vaslow, [name] K, MD  midodrine  (PROAMATINE ) 2.5 MG tablet Take 2.5 mg by mouth 3 (three) times daily.   Yes [provider]  oxyCODONE -acetaminophen  (PERCOCET) 5-325 MG tablet Take 1 tablet by mouth every 6 (six) hours as needed for severe pain (pain score 7-10). 09/17/23  Yes Wieting, Richard, MD  pomalidomide  (POMALYST ) 2 MG capsule Take 1 capsule (2 mg total) by mouth daily. Take for 21 days, the hold for 7 days. Repeat every 28 days. 10/19/23  Yes Melanee Annah BROCKS, MD  bisacodyl  5 MG EC tablet Take 1 tablet (5 mg total) by mouth at bedtime as needed for mild constipation. Skip the dose if no constipation Patient not taking: Reported on 11/11/2023 09/17/23   Josette Ade, MD  Continuous Glucose Sensor (DEXCOM G7 SENSOR) MISC 1 each by Does not apply route 4 (four) times daily -  before meals and at bedtime. 04/10/23   Jhonny Calvin NOVAK, MD  insulin  aspart (NOVOLOG ) 100 UNIT/ML injection Inject 1 Units into the skin 3 (three) times daily with meals. If eating and Blood Glucose (BG) 80 or higher inject 1 units for meal  coverage and add correction dose per scale. If not eating, correction dose only. BG <150= 0 unit; BG 150-200= 1 unit; BG 201-250= 2 unit; BG 251-300= 3 unit; BG 301-350= 4 unit; BG 351-400= 5 unit; BG >400= 6 unit and Call Primary Care. Patient not taking: No sig reported 09/17/23   Josette Ade, MD  insulin  glargine-yfgn (SEMGLEE ) 100 UNIT/ML injection Inject 0.04 mLs (4 Units total) into the skin daily. Patient not taking: Reported on 11/11/2023 09/18/23   Josette Ade, MD  iron  polysaccharides (NIFEREX) 150 MG capsule Take 1 capsule (150 mg total) by mouth daily. Patient not taking: Reported on 11/11/2023 03/20/23 11/10/23  Von Bellis, MD    Physical Exam: Constitutional: Moderately built and nourished. Vitals:   11/11/23 1915 11/11/23 1930 11/11/23 2000 11/11/23 2045  BP: (!) 159/98 (!) 148/99 (!) 169/105 (!) 149/96  Pulse: 72 72 70 69  Resp: 14 17 (!) 8 (!) 7  Temp:      TempSrc:      SpO2: 100% 100% 100% 100%  Weight:  Height:       Eyes: Anicteric no pallor. ENMT: No discharge from the ears eyes nose or mouth. Neck: No mass felt.  No neck rigidity. Respiratory: No rhonchi or crepitations. Cardiovascular: S1-S2 heard. Abdomen: Soft nontender bowel sound present. Musculoskeletal: No edema. Skin: No rash. Neurologic: Alert awake oriented to his name and place moving all extremities. Psychiatric: Oriented to name and place.   Labs on Admission: I have personally reviewed following labs and imaging studies  CBC: Recent Labs  Lab 11/10/23 0832 11/11/23 1523  WBC 3.2* 4.1  NEUTROABS 1.6* 2.7  HGB 8.4* 9.7*  HCT 25.7* 30.5*  MCV 97.7 100.0  PLT 262 194   Basic Metabolic Panel: Recent Labs  Lab 11/10/23 0832 11/11/23 1523  NA 133* 129*  K 4.5 5.2*  CL 99 96*  CO2 28 21*  GLUCOSE 120* 712*  BUN 30* 43*  CREATININE 1.36* 1.96*  CALCIUM  8.9 9.1   GFR: Estimated Creatinine Clearance: 31.4 mL/min (A) (by C-G formula based on SCr of 1.96 mg/dL  (H)). Liver Function Tests: Recent Labs  Lab 11/10/23 0832 11/11/23 1523  AST 24 26  ALT 28 29  ALKPHOS 72 72  BILITOT 0.7 1.0  PROT 9.0* 8.5*  ALBUMIN 3.2* 3.1*   No results for input(s): LIPASE, AMYLASE in the last 168 hours. No results for input(s): AMMONIA  in the last 168 hours. Coagulation Profile: No results for input(s): INR, PROTIME in the last 168 hours. Cardiac Enzymes: No results for input(s): CKTOTAL, CKMB, CKMBINDEX, TROPONINI in the last 168 hours. BNP (last 3 results) No results for input(s): PROBNP in the last 8760 hours. HbA1C: No results for input(s): HGBA1C in the last 72 hours. CBG: Recent Labs  Lab 11/11/23 1654 11/11/23 1903 11/11/23 1937 11/11/23 2035  GLUCAP 577* 416* 324* 241*   Lipid Profile: No results for input(s): CHOL, HDL, LDLCALC, TRIG, CHOLHDL, LDLDIRECT in the last 72 hours. Thyroid  Function Tests: No results for input(s): TSH, T4TOTAL, FREET4, T3FREE, THYROIDAB in the last 72 hours. Anemia Panel: Recent Labs    11/10/23 0832  FERRITIN 286  TIBC 214*  IRON  62   Urine analysis:    Component Value Date/Time   COLORURINE STRAW (A) 11/11/2023 1523   APPEARANCEUR CLEAR 11/11/2023 1523   LABSPEC 1.018 11/11/2023 1523   PHURINE 5.0 11/11/2023 1523   GLUCOSEU >=500 (A) 11/11/2023 1523   HGBUR NEGATIVE 11/11/2023 1523   BILIRUBINUR NEGATIVE 11/11/2023 1523   KETONESUR 5 (A) 11/11/2023 1523   PROTEINUR NEGATIVE 11/11/2023 1523   NITRITE NEGATIVE 11/11/2023 1523   LEUKOCYTESUR NEGATIVE 11/11/2023 1523   Sepsis Labs: @LABRCNTIP (procalcitonin:4,lacticidven:4) )No results found for this or any previous visit (from the past 240 hours).   Radiological Exams on Admission: DG Chest Port 1 View Result Date: 11/11/2023 EXAM: 1 VIEW XRAY OF THE CHEST 11/11/2023 03:33:00 PM COMPARISON: AP radiograph of the chest dated 09/23/2023. CLINICAL HISTORY: Fall trauma. FINDINGS: LUNGS AND PLEURA: No focal  pulmonary opacity. No pulmonary edema. No pleural effusion. No pneumothorax. HEART AND MEDIASTINUM: No acute abnormality of the cardiac and mediastinal silhouettes. BONES AND SOFT TISSUES: No acute osseous abnormality. IMPRESSION: 1. No acute process. Electronically signed by: evalene coho 11/11/2023 03:58 PM EDT RP Workstation: [id number]   CT Cervical Spine Wo Contrast Result Date: 11/11/2023 EXAM: CT CERVICAL SPINE WITHOUT CONTRAST 11/11/2023 03:50:08 PM TECHNIQUE: CT of the cervical spine was performed without the administration of intravenous contrast. Multiplanar reformatted images are provided for review. Automated exposure control, iterative reconstruction, and/or weight based  adjustment of the mA/kV was utilized to reduce the radiation dose to as low as reasonably achievable. COMPARISON: CT of the cervical spine dated 08/27/2023. CLINICAL HISTORY: Neck pain, acute, no red flags. PT BIB [facility] from home. PT was outside and fell. Fall was witnessed by son who states he hit his head. PT denies hitting head. PT is slightly confused which is not new but no dx of dementia. In the last 12 hrs. PT reportedly pulled out more of the wiring in his insulin  pump. CBG 598 on scene and minimal fluids given. FINDINGS: CERVICAL SPINE: BONES AND ALIGNMENT: No acute fracture or traumatic malalignment. DEGENERATIVE CHANGES: Slight degenerative ventralisthesis at C3-4. Chronic degenerative disc disease at C5-6 and C6-7 with mild central spinal canal stenosis at both levels. SOFT TISSUES: No prevertebral soft tissue swelling. VASCULATURE: Moderate calcification within the carotid bulbs bilaterally. IMPRESSION: 1. No acute abnormality of the cervical spine related to the reported fall. 2. Chronic degenerative disc disease at C5-6 and C6-7 with mild central spinal canal stenosis at both levels. Electronically signed by: evalene coho 11/11/2023 03:57 PM EDT RP Workstation: [id number]   CT Head Wo Contrast Result Date:  11/11/2023 EXAM: CT HEAD WITHOUT CONTRAST 11/11/2023 03:50:08 PM TECHNIQUE: CT of the head was performed without the administration of intravenous contrast. Automated exposure control, iterative reconstruction, and/or weight based adjustment of the mA/kV was utilized to reduce the radiation dose to as low as reasonably achievable. COMPARISON: CT of the head dated 09/14/2023. CLINICAL HISTORY: Head trauma, moderate-severe. PT BIB [facility] from home. PT was outside and fell. Fall was witnessed by son who states he hit his head. PT denies hitting head. PT is slightly confused which is not new but no dx of dementia. In the last 12 hrs. PT reportedly pulled out more of the wiring in his insulin  pump. CBG 598 on scene and minimal fluids given. FINDINGS: BRAIN AND VENTRICLES: No acute hemorrhage. Gray-white differentiation is preserved. No hydrocephalus. No extra-axial collection. No mass effect or midline shift. Age-related cerebral volume loss and mild periventricular white matter disease. ORBITS: No acute abnormality. SINUSES: No acute abnormality. SOFT TISSUES AND SKULL: No acute soft tissue abnormality. No skull fracture. Extensive dystrophic calcifications along the falx. Moderate vascular calcifications. The previously described subdural hematoma has resolved. IMPRESSION: 1. No acute intracranial abnormality related to the head trauma. 2. Age-related cerebral volume loss and mild periventricular white matter disease. 3. Extensive dystrophic calcifications along the falx and moderate vascular calcifications. Electronically signed by: evalene coho 11/11/2023 03:54 PM EDT RP Workstation: [id number]    EKG: Independently reviewed.  8 5.  Assessment/Plan Principal Problem:   Hyperosmolar hyperglycemic state (HHS) (HCC) Active Problems:   Hyponatremia   CKD (chronic kidney disease) stage 3, GFR 30-59 ml/min (HCC)   Seizure disorder (HCC)   Dysautonomia orthostatic hypotension with recurrent syncope    Multiple myeloma (HCC)   MCI (mild cognitive impairment) with memory loss    Hyperosmolar nonketotic state in diabetes mellitus type 1 likely from missing his insulin  dose due to his insulin  pump getting inadvertently disconnected.  Usually managed by his wife.  Once blood sugars less than 250 will try to change back to subcu insulin .  As per the endocrinology notes patient's basal insulin  is at 0.25 units/h.  Last hemoglobin A1c was 8.6 2 weeks ago. Acute on chronic kidney disease stage III with mild hyponatremia and hyperkalemia which I think will get corrected with insulin  and hydration.  Follow metabolic panel. History of orthostatic hypotension with recurrent  falls takes midodrine  and as needed Florinef . History of A-fib on Eliquis .  Confirmed with patient's wife that Eliquis  was restarted after his recent subdural hematoma. History of seizures on Keppra  has not had any recent seizures per patient's wife.  Dose may need to be closely monitored given the chronic kidney disease. History of multiple myeloma on Pomalyst  per oncology.  Needs to be started from tomorrow as per the wife.  On acyclovir  as prophylaxis. Chronic anemia likely related to the kidney disease and multiple myeloma.  Follow CBC. Diabetic neuropathy on gabapentin  confirmed dose with patient wife.  Dose needs to be adjusted if creatinine worsens. Hyperlipidemia on statins. Mild cognitive impairment confirmed with family.  Has follow-up with neurologist as per the family in the next few weeks for further workup. History of subdural hematoma and recent left upper extremity fracture.  Since patient has uncontrolled diabetes with hyperosmolar status will need close monitoring further workup and more than 2 midnight stay.   DVT prophylaxis: Eliquis . Code Status: Full code confirmed with patient's wife. Family Communication: Patient's wife. Disposition Plan: Monitored bed. Consults called: Physical therapy. Admission status:  Observation.

## 2023-11-11 NOTE — Progress Notes (Signed)
 Orthopedic Tech Progress Note Patient Details:  Nicholas Weiss 04/23/1950 [id number]  Level II trauma, no ortho tech orders at this time.  Patient ID: Nicholas Weiss, male   DOB: 30-Jun-1950, 73 y.o.   MRN: [id number]  Nicholas Weiss 11/11/2023, 7:13 PM

## 2023-11-11 NOTE — ED Notes (Signed)
 Patient was seen by Dr MARLA. He advised he wanted a BMET drawn on the patient and he also wanted the patient to have something to eat. Patient was sat up in bed and given a [place] sandwich and sugar free ginger ale.

## 2023-11-11 NOTE — ED Notes (Signed)
 Per Dr MARLA. Discontinue insulin  drip and LR with dextrose . Patient will receive subcut insulin . Patient ate 1 sandwich, applesauce and sugar free ginger-ale.

## 2023-11-11 NOTE — ED Provider Notes (Signed)
 [place] [facility] AT [facility] Provider Note   CSN: [id number] Arrival date & time: 11/11/23  1511     Patient presents with: FALL ON THINNERS   Nicholas Weiss is a 73 y.o. male.   Patient complains of falling just prior to arrival.  EMS reports patient has recently had multiple falls.  EMS reports patient has had problems with falling and with confusion.  They were told by family that patient is supposed to have evaluation for dementia.  Patient has a left shoulder fracture from June.  Family reports patient may have hit his head.  He is on Eliquis .  Patient currently denies any area of pain.  EMS reports patient's glucose was over 500.  There was some concern expressed that patient's insulin  pump does not work.  Patient denies any cough he denies any congestion.  Patient denies headache neck pain chest pain or abdominal pain.  He is able to move all extremities..  The history is provided by the patient. No language interpreter was used.       Prior to Admission medications   Medication Sig Start Date End Date Taking? Authorizing Provider  acetaminophen  (TYLENOL ) 325 MG tablet Take 2 tablets (650 mg total) by mouth every 6 (six) hours as needed for mild pain (pain score 1-3) or fever. 09/17/23   Josette Ade, MD  acyclovir  (ZOVIRAX ) 400 MG tablet Take 1 tablet (400 mg total) by mouth 2 (two) times daily. 08/13/23 11/11/23  Melanee Annah BROCKS, MD  apixaban  (ELIQUIS ) 5 MG TABS tablet Take 5 mg by mouth 2 (two) times daily. 10/20/23   [provider]  atorvastatin  (LIPITOR ) 80 MG tablet Take 1 tablet (80 mg total) by mouth at bedtime. Hold while taking Paxlovid  04/08/22   Caleen Qualia, MD  bisacodyl  5 MG EC tablet Take 1 tablet (5 mg total) by mouth at bedtime as needed for mild constipation. Skip the dose if no constipation 09/17/23   Josette Ade, MD  Continuous Glucose Sensor (DEXCOM G7 SENSOR) MISC 1 each by Does not apply route 4 (four) times daily -   before meals and at bedtime. 04/10/23   Jhonny Calvin NOVAK, MD  fludrocortisone  (FLORINEF ) 0.1 MG tablet Take 1 tablet (0.1 mg total) by mouth daily as needed. Skip the dose if SBP >130 09/17/23   Josette Ade, MD  gabapentin  (NEURONTIN ) 300 MG capsule Take 1 capsule (300 mg total) by mouth 2 (two) times daily. 09/17/23   Josette Ade, MD  insulin  aspart (NOVOLOG ) 100 UNIT/ML injection Inject 1 Units into the skin 3 (three) times daily with meals. If eating and Blood Glucose (BG) 80 or higher inject 1 units for meal coverage and add correction dose per scale. If not eating, correction dose only. BG <150= 0 unit; BG 150-200= 1 unit; BG 201-250= 2 unit; BG 251-300= 3 unit; BG 301-350= 4 unit; BG 351-400= 5 unit; BG >400= 6 unit and Call Primary Care. Patient not taking: Reported on 11/10/2023 09/17/23   Josette Ade, MD  insulin  glargine-yfgn (SEMGLEE ) 100 UNIT/ML injection Inject 0.04 mLs (4 Units total) into the skin daily. 09/18/23   Josette Ade, MD  insulin  lispro (HUMALOG) 100 UNIT/ML injection INJECT 150 UNITS EVERY 3 DAYS VIA INSULIN  PUMP AS DIRECTED 10/26/23   [provider]  iron  polysaccharides (NIFEREX) 150 MG capsule Take 1 capsule (150 mg total) by mouth daily. 03/20/23 11/10/23  Von Bellis, MD  latanoprost  (XALATAN ) 0.005 % ophthalmic solution 1 drop at bedtime. 11/02/23  [provider]  levETIRAcetam  (KEPPRA ) 500 MG tablet Take 1 tablet by mouth twice daily 09/09/23   Vaslow, [name] K, MD  midodrine  (PROAMATINE ) 2.5 MG tablet Take 2.5 mg by mouth 3 (three) times daily.    [provider]  oxyCODONE -acetaminophen  (PERCOCET) 5-325 MG tablet Take 1 tablet by mouth every 6 (six) hours as needed for severe pain (pain score 7-10). 09/17/23   Josette Ade, MD  pomalidomide  (POMALYST ) 2 MG capsule Take 1 capsule (2 mg total) by mouth daily. Take for 21 days, the hold for 7 days. Repeat every 28 days. 10/19/23   Melanee Annah BROCKS, MD    Allergies:  Penicillins    Review of Systems  All other systems reviewed and are negative.   Updated Vital Signs BP (!) 169/103   Pulse 72   Temp 97.7 F (36.5 C) (Oral)   Resp 10   Ht 5' 10 (1.778 m)   Wt 63.3 kg   SpO2 100%   BMI 20.02 kg/m   Physical Exam Vitals and nursing note reviewed.  Constitutional:      Appearance: He is well-developed.  HENT:     Head: Normocephalic and atraumatic.     Right Ear: External ear normal.     Left Ear: External ear normal.     Mouth/Throat:     Mouth: Mucous membranes are moist.  Eyes:     Pupils: Pupils are equal, round, and reactive to light.  Cardiovascular:     Rate and Rhythm: Normal rate.  Pulmonary:     Effort: Pulmonary effort is normal.  Abdominal:     General: There is no distension.  Musculoskeletal:        General: Swelling and tenderness present.     Cervical back: Normal range of motion.  Skin:    General: Skin is warm.  Neurological:     General: No focal deficit present.     Mental Status: He is alert and oriented to person, place, and time.     Comments: confused  Psychiatric:        Mood and Affect: Mood normal.     (all labs ordered are listed, but only abnormal results are displayed) Labs Reviewed  CBC WITH DIFFERENTIAL/PLATELET - Abnormal; Notable for the following components:      Result Value   RBC 3.05 (*)    Hemoglobin 9.7 (*)    HCT 30.5 (*)    RDW 17.1 (*)    All other components within normal limits  COMPREHENSIVE METABOLIC PANEL WITH GFR - Abnormal; Notable for the following components:   Sodium 129 (*)    Potassium 5.2 (*)    Chloride 96 (*)    CO2 21 (*)    Glucose, Bld 712 (*)    BUN 43 (*)    Creatinine, Ser 1.96 (*)    Total Protein 8.5 (*)    Albumin 3.1 (*)    GFR, Estimated 36 (*)    All other components within normal limits  URINALYSIS, COMPLETE (UACMP) WITH MICROSCOPIC - Abnormal; Notable for the following components:   Color, Urine STRAW (*)    Glucose, UA >=500 (*)     Ketones, ur 5 (*)    All other components within normal limits  BETA-HYDROXYBUTYRIC ACID - Abnormal; Notable for the following components:   Beta-Hydroxybutyric Acid 2.30 (*)    All other components within normal limits  CBG MONITORING, ED - Abnormal; Notable for the following components:   Glucose-Capillary 577 (*)  All other components within normal limits    EKG: None  Radiology: [facility] Chest Port 1 View Result Date: 11/11/2023 EXAM: 1 VIEW XRAY OF THE CHEST 11/11/2023 03:33:00 PM COMPARISON: AP radiograph of the chest dated 09/23/2023. CLINICAL HISTORY: Fall trauma. FINDINGS: LUNGS AND PLEURA: No focal pulmonary opacity. No pulmonary edema. No pleural effusion. No pneumothorax. HEART AND MEDIASTINUM: No acute abnormality of the cardiac and mediastinal silhouettes. BONES AND SOFT TISSUES: No acute osseous abnormality. IMPRESSION: 1. No acute process. Electronically signed by: evalene coho 11/11/2023 03:58 PM EDT RP Workstation: [id number]   CT Cervical Spine Wo Contrast Result Date: 11/11/2023 EXAM: CT CERVICAL SPINE WITHOUT CONTRAST 11/11/2023 03:50:08 PM TECHNIQUE: CT of the cervical spine was performed without the administration of intravenous contrast. Multiplanar reformatted images are provided for review. Automated exposure control, iterative reconstruction, and/or weight based adjustment of the mA/kV was utilized to reduce the radiation dose to as low as reasonably achievable. COMPARISON: CT of the cervical spine dated 08/27/2023. CLINICAL HISTORY: Neck pain, acute, no red flags. PT BIB [facility] from home. PT was outside and fell. Fall was witnessed by son who states he hit his head. PT denies hitting head. PT is slightly confused which is not new but no dx of dementia. In the last 12 hrs. PT reportedly pulled out more of the wiring in his insulin  pump. CBG 598 on scene and minimal fluids given. FINDINGS: CERVICAL SPINE: BONES AND ALIGNMENT: No acute fracture or traumatic malalignment.  DEGENERATIVE CHANGES: Slight degenerative ventralisthesis at C3-4. Chronic degenerative disc disease at C5-6 and C6-7 with mild central spinal canal stenosis at both levels. SOFT TISSUES: No prevertebral soft tissue swelling. VASCULATURE: Moderate calcification within the carotid bulbs bilaterally. IMPRESSION: 1. No acute abnormality of the cervical spine related to the reported fall. 2. Chronic degenerative disc disease at C5-6 and C6-7 with mild central spinal canal stenosis at both levels. Electronically signed by: evalene coho 11/11/2023 03:57 PM EDT RP Workstation: [id number]   CT Head Wo Contrast Result Date: 11/11/2023 EXAM: CT HEAD WITHOUT CONTRAST 11/11/2023 03:50:08 PM TECHNIQUE: CT of the head was performed without the administration of intravenous contrast. Automated exposure control, iterative reconstruction, and/or weight based adjustment of the mA/kV was utilized to reduce the radiation dose to as low as reasonably achievable. COMPARISON: CT of the head dated 09/14/2023. CLINICAL HISTORY: Head trauma, moderate-severe. PT BIB [facility] from home. PT was outside and fell. Fall was witnessed by son who states he hit his head. PT denies hitting head. PT is slightly confused which is not new but no dx of dementia. In the last 12 hrs. PT reportedly pulled out more of the wiring in his insulin  pump. CBG 598 on scene and minimal fluids given. FINDINGS: BRAIN AND VENTRICLES: No acute hemorrhage. Gray-white differentiation is preserved. No hydrocephalus. No extra-axial collection. No mass effect or midline shift. Age-related cerebral volume loss and mild periventricular white matter disease. ORBITS: No acute abnormality. SINUSES: No acute abnormality. SOFT TISSUES AND SKULL: No acute soft tissue abnormality. No skull fracture. Extensive dystrophic calcifications along the falx. Moderate vascular calcifications. The previously described subdural hematoma has resolved. IMPRESSION: 1. No acute intracranial  abnormality related to the head trauma. 2. Age-related cerebral volume loss and mild periventricular white matter disease. 3. Extensive dystrophic calcifications along the falx and moderate vascular calcifications. Electronically signed by: evalene coho 11/11/2023 03:54 PM EDT RP Workstation: [id number]     Procedures   Medications Ordered in the ED  insulin  regular, human (MYXREDLIN ) 100 units/ 100  mL infusion (6.5 Units/hr Intravenous New Bag/Given 11/11/23 1804)  lactated ringers  infusion ( Intravenous New Bag/Given 11/11/23 1805)  dextrose  5 % in lactated ringers  infusion (has no administration in time range)  dextrose  50 % solution 0-50 mL (has no administration in time range)  sodium chloride  0.9 % bolus 1,000 mL (has no administration in time range)                                    Medical Decision Making Patient brought in after falling and possibly hitting his head.  He is on a blood thinner.  Patient was activated as a level 2 trauma.  Amount and/or Complexity of Data Reviewed Independent Historian: EMS    Details: History is provided by EMS External Data Reviewed: notes.    Details: Oncology notes reviewed.  Patient has a history of multiple myeloma. Labs: ordered. Decision-making details documented in ED Course.    Details: Labs ordered reviewed and interpreted glucose is 712 BUN is 43 creatinine is 1.96.  Beta hydroxybutyric  acid is 2.30.  Sodium is 129 potassium is 5.2 BUN is 43 creatinine is 1.96 Radiology: ordered and independent interpretation performed. Decision-making details documented in ED Course.    Details: CT head CT cervical spine showed no acute findings Chest x-ray no acute findings ECG/medicine tests: ordered and independent interpretation performed. Decision-making details documented in ED Course.    Details: EKG normal sinus no acute findings Discussion of management or test interpretation with external provider(s): Medicine consulted for  admission.  Risk Prescription drug management. Decision regarding hospitalization. Risk Details: Patient given IV fluids and placed on Endo tool.   Dr. Bari in to see and evaluate pt.      Final diagnoses:  Fall, initial encounter  Contusion of head, unspecified part of head, initial encounter  Hyperglycemia  Hyperkalemia    ED Discharge Orders     None          [place] Sonny POUR, [place] 11/11/23 1934    Bari Roxie HERO, DO 11/11/23 2341

## 2023-11-11 NOTE — ED Triage Notes (Signed)
 PT BIB [facility] from home. PT was outside and fell. Fall was witnessed by son who states he hit his head. PT denies hitting head. PT is slightly confused which is not new but no dx of dementia. In the last 12 hrs. PT reportedly pulled out more of the wiring in his insulin  pump. CBG 598 on scene and minimal fluids given.

## 2023-11-11 NOTE — ED Notes (Signed)
 Trauma Response Nurse Documentation  Nicholas Weiss is a 73 y.o. male arriving to [facility] ED via EMS  On Eliquis  (apixaban ) daily. Trauma was activated as a Level 2 based on the following trauma criteria Elderly patients > 65 with head trauma on anti-coagulation (excluding ASA).  Patient cleared for CT by Dr. Bari. Pt transported to CT with trauma response nurse present to monitor. RN remained with the patient throughout their absence from the department for clinical observation. GCS 14.  History   Past Medical History:  Diagnosis Date   Acute metabolic encephalopathy 12/02/2020   DKA (diabetic ketoacidoses) 04/06/2016   Hypercholesteremia    Hypertension      Past Surgical History:  Procedure Laterality Date   AMPUTATION TOE Left 04/25/2022   Procedure: LEFT GREAT TOE AMPUTATION AND LEFT PARTIAL 2ND TOE AMPUTATION;  Surgeon: Janit Thresa HERO, DPM;  Location: [facility] ORS;  Service: Podiatry;  Laterality: Left;   COLONOSCOPY WITH PROPOFOL  N/A 02/01/2020   Procedure: COLONOSCOPY WITH PROPOFOL ;  Surgeon: Unk Corinn Skiff, MD;  Location: [facility] ENDOSCOPY;  Service: Gastroenterology;  Laterality: N/A;   ESOPHAGOGASTRODUODENOSCOPY  02/01/2020   Procedure: ESOPHAGOGASTRODUODENOSCOPY (EGD);  Surgeon: Unk Corinn Skiff, MD;  Location: [facility] ENDOSCOPY;  Service: Gastroenterology;;   ESOPHAGOGASTRODUODENOSCOPY (EGD) WITH PROPOFOL  N/A 06/07/2022   Procedure: ESOPHAGOGASTRODUODENOSCOPY (EGD) WITH PROPOFOL ;  Surgeon: Saintclair Jasper, MD;  Location: [facility] ENDOSCOPY;  Service: Gastroenterology;  Laterality: N/A;   KNEE SURGERY Right    Torn meniscus   KNEE SURGERY Left      Initial Focused Assessment (If applicable, or please see trauma documentation): Patient oriented to name, otherwise disoriented.  Airway intact, bilateral breath sounds Pulses 2+ No obvious traumatic injury identified  CT's Completed:   CT Head and CT C-Spine   Interventions:  IV, labs CXR CT Head/Cspine  Plan for disposition:   Admission to floor   Event Summary: Patient to ED via [facility] after multiple falls. Per EMS patient fell last night and again this morning per family. Has been more confused lately and plan is for dementia testing. Fell last week and injured L arm (arrived in sling). Patient is reporting no pain. Imaging was ordered and revealed no traumatic injury. Per EMS patient CBG >500, Type 1 diabetic with insulin  pump - diabetes coordinator consult placed. Patient to be admitted for management of acute hyperglycemia.   Bedside handoff with ED RN Nicholas Weiss.    Nicholas Weiss Nicholas Weiss  Trauma Response RN  Please call TRN at [phone] for further assistance.

## 2023-11-12 ENCOUNTER — Other Ambulatory Visit: Payer: Self-pay

## 2023-11-12 DIAGNOSIS — E1100 Type 2 diabetes mellitus with hyperosmolarity without nonketotic hyperglycemic-hyperosmolar coma (NKHHC): Secondary | ICD-10-CM | POA: Diagnosis not present

## 2023-11-12 LAB — GLUCOSE, CAPILLARY
Glucose-Capillary: 171 mg/dL — ABNORMAL HIGH (ref 70–99)
Glucose-Capillary: 128 mg/dL — ABNORMAL HIGH (ref 70–99)
Glucose-Capillary: 124 mg/dL — ABNORMAL HIGH (ref 70–99)
Glucose-Capillary: 320 mg/dL — ABNORMAL HIGH (ref 70–99)

## 2023-11-12 LAB — CBG MONITORING, ED: Glucose-Capillary: 184 mg/dL — ABNORMAL HIGH (ref 70–99)

## 2023-11-12 MED ORDER — LACTATED RINGERS IV SOLN: INTRAVENOUS | Status: DC

## 2023-11-12 MED ORDER — INSULIN ASPART 100 UNIT/ML IJ SOLN: 0.0000 [IU] | Freq: Three times a day (TID) | INTRAMUSCULAR | Status: DC

## 2023-11-12 MED ORDER — INSULIN ASPART 100 UNIT/ML IJ SOLN
0.0000 [IU] | Freq: Every day | INTRAMUSCULAR | Status: DC
  Administered 2023-11-12: 4 [IU] via SUBCUTANEOUS

## 2023-11-12 MED ORDER — FLUDROCORTISONE ACETATE 0.1 MG PO TABS
0.1000 mg | ORAL_TABLET | Freq: Every day | ORAL | Status: DC
  Administered 2023-11-12 – 2023-11-13 (×2): 0.1 mg via ORAL
  Filled 2023-11-12 (×2): qty 1

## 2023-11-12 MED ORDER — MIDODRINE HCL 5 MG PO TABS
5.0000 mg | ORAL_TABLET | Freq: Three times a day (TID) | ORAL | Status: DC
  Administered 2023-11-12 – 2023-11-13 (×4): 5 mg via ORAL
  Filled 2023-11-12 (×4): qty 1

## 2023-11-12 NOTE — Progress Notes (Signed)
 PROGRESS NOTE                                                                                                                                                                                                             Patient Demographics:    Nicholas Weiss, is a 73 y.o. male, DOB - Aug 31, 1950, FMW:[id number]  Outpatient Primary MD for the patient is Alla Amis, MD    LOS - 0  Admit date - 11/11/2023    Chief Complaint  Patient presents with   FALL ON THINNERS       Brief Narrative (HPI from H&P)   73 y.o. male with history of diabetes mellitus type 1 on insulin  pump last hemoglobin A1c was 8.6 on October 25, 2023, chronic orthostatic hypotension, paroxysmal atrial fibrillation, seizures, mild cognitive impairment for which family is planning to follow-up with neurologist in the next few weeks was brought to the ER after patient had a fall today.  As per the patient's son and wife with whom I spoke patient fell when he was trying to get up.  Has been having recurrent falls was admitted in June 2025 for subdural hematoma after fall.  Also recently had fractured his left upper extremity which is being managed conservatively and he wears a sling.  Admitted to the hospital for potential malfunction of his insulin  pump causing nonketotic hyperosmolar state.   Subjective:    Nash Bolls today has, No headache, No chest pain, No abdominal pain - No Nausea, No new weakness tingling or numbness, no shortness of breath, gets lightheaded upon standing up.   Assessment  & Plan :    Hyperosmolar nonketotic state in diabetes mellitus type 1 likely from missing his insulin  dose due to his insulin  pump getting inadvertently disconnected.  Been treated appropriately with IV fluids and IV insulin , stable now transition to subcu insulin , diabetic educator following will monitor CBG closely.  Lab Results  Component Value Date   HGBA1C  10.0 (H) 07/21/2023   CBG (last 3)  Recent Labs    11/11/23 2307 11/12/23 0013 11/12/23 0733  GLUCAP 168* 184* 171*     Acute on chronic kidney disease stage III with mild hyponatremia and hyperkalemia which I think will get corrected with insulin  and hydration.  Follow metabolic panel. History of orthostatic hypotension with recurrent falls over the  last several weeks.  Continue midodrine , add low-dose Florinef , add TED stockings, PT OT monitor, monitor closely.  Had a fall in the hospital early this morning, no new aches or pains continue to monitor closely. History of A-fib on Eliquis .  Confirmed with patient's wife that Eliquis  was restarted after his recent subdural hematoma. History of seizures on Keppra  has not had any recent seizures per patient's wife.  Dose may need to be closely monitored given the chronic kidney disease. History of multiple myeloma on Pomalyst  per oncology.  Needs to be started from tomorrow as per the wife.  On acyclovir  as prophylaxis. Chronic anemia likely related to the kidney disease and multiple myeloma.  Follow CBC. Diabetic neuropathy on gabapentin  confirmed dose with patient wife.  Dose needs to be adjusted if creatinine worsens. Hyperlipidemia on statins. Mild cognitive impairment confirmed with family.  Has follow-up with neurologist as per the family in the next few weeks for further workup. History of subdural hematoma and recent left upper extremity fracture.         Condition - Extremely Guarded  Family Communication  : Girlfriend over the phone on 11/12/2023  Code Status : Full code  Consults  : Diabetic educator  PUD Prophylaxis :     Procedures  :     CT head and C-spine.  Nonacute      Disposition Plan  :    Status is: Observation   DVT Prophylaxis  :    Place TED hose Start: 11/12/23 0739 apixaban  (ELIQUIS ) tablet 5 mg     Lab Results  Component Value Date   PLT 194 11/11/2023    Diet :  Diet Order              Diet Carb Modified Fluid consistency: Thin; Room service appropriate? Yes  Diet effective now                    Inpatient Medications  Scheduled Meds:  acyclovir   400 mg Oral BID   apixaban   5 mg Oral BID   atorvastatin   80 mg Oral QHS   gabapentin   300 mg Oral BID   insulin  aspart  0-9 Units Subcutaneous TID WC   insulin  glargine-yfgn  6 Units Subcutaneous QHS   latanoprost   1 drop Both Eyes QHS   levETIRAcetam   500 mg Oral BID   midodrine   5 mg Oral TID   Continuous Infusions:  lactated ringers  75 mL/hr at 11/12/23 0936   PRN Meds:.dextrose   Antibiotics  :    Anti-infectives (From admission, onward)    Start     Dose/Rate Route Frequency Ordered Stop   11/11/23 2200  acyclovir  (ZOVIRAX ) tablet 400 mg        400 mg Oral 2 times daily 11/11/23 2108           Objective:   Vitals:   11/12/23 0728 11/12/23 0735 11/12/23 0739 11/12/23 0800  BP: (!) 176/116 101/70 90/68 (!) 180/104  Pulse: 70 68 72 70  Resp:  16  14  Temp: 97.8 F (36.6 C)     TempSrc: Oral     SpO2: 99% 100% 99% 100%  Weight:      Height:        Wt Readings from Last 3 Encounters:  11/11/23 63.3 kg  11/10/23 63.3 kg  10/20/23 68 kg     Intake/Output Summary (Last 24 hours) at 11/12/2023 0940 Last data filed at 11/12/2023 0800 Gross per 24 hour  Intake --  Output 100 ml  Net -100 ml     Physical Exam  Awake Alert, No new F.N deficits, Normal affect .AT,PERRAL Supple Neck, No JVD,   Symmetrical Chest wall movement, Good air movement bilaterally, CTAB RRR,No Gallops,Rubs or new Murmurs,  +ve B.Sounds, Abd Soft, No tenderness,   Left arm in sling    Data Review:    Recent Labs  Lab 11/10/23 0832 11/11/23 1523  WBC 3.2* 4.1  HGB 8.4* 9.7*  HCT 25.7* 30.5*  PLT 262 194  MCV 97.7 100.0  MCH 31.9 31.8  MCHC 32.7 31.8  RDW 16.9* 17.1*  LYMPHSABS 1.2 0.7  MONOABS 0.3 0.6  EOSABS 0.1 0.0  BASOSABS 0.0 0.0    Recent Labs  Lab 11/10/23 0832 11/11/23 1523  11/11/23 2115  NA 133* 129* 136  K 4.5 5.2* 3.9  CL 99 96* 103  CO2 28 21* 25  ANIONGAP 6 12 8   GLUCOSE 120* 712* 232*  BUN 30* 43* 36*  CREATININE 1.36* 1.96* 1.57*  AST 24 26  --   ALT 28 29  --   ALKPHOS 72 72  --   BILITOT 0.7 1.0  --   ALBUMIN 3.2* 3.1*  --   CALCIUM  8.9 9.1 8.9      Recent Labs  Lab 11/10/23 0832 11/11/23 1523 11/11/23 2115  CALCIUM  8.9 9.1 8.9    --------------------------------------------------------------------------------------------------------------- Lab Results  Component Value Date   CHOL 128 08/10/2021   HDL 38 (L) 08/10/2021   LDLCALC 60 08/10/2021   TRIG 152 (H) 08/10/2021   CHOLHDL 3.4 08/10/2021    Lab Results  Component Value Date   HGBA1C 10.0 (H) 07/21/2023   No results for input(s): TSH, T4TOTAL, FREET4, T3FREE, THYROIDAB in the last 72 hours. Recent Labs    11/10/23 0832  FERRITIN 286  TIBC 214*  IRON  62   ------------------------------------------------------------------------------------------------------------------ Cardiac Enzymes No results for input(s): CKMB, TROPONINI, MYOGLOBIN in the last 168 hours.  Invalid input(s): CK  Micro Results No results found for this or any previous visit (from the past 240 hours).  Radiology Report DG Chest Port 1 View Result Date: 11/11/2023 EXAM: 1 VIEW XRAY OF THE CHEST 11/11/2023 03:33:00 PM COMPARISON: AP radiograph of the chest dated 09/23/2023. CLINICAL HISTORY: Fall trauma. FINDINGS: LUNGS AND PLEURA: No focal pulmonary opacity. No pulmonary edema. No pleural effusion. No pneumothorax. HEART AND MEDIASTINUM: No acute abnormality of the cardiac and mediastinal silhouettes. BONES AND SOFT TISSUES: No acute osseous abnormality. IMPRESSION: 1. No acute process. Electronically signed by: evalene coho 11/11/2023 03:58 PM EDT RP Workstation: [id number]   CT Cervical Spine Wo Contrast Result Date: 11/11/2023 EXAM: CT CERVICAL SPINE WITHOUT CONTRAST  11/11/2023 03:50:08 PM TECHNIQUE: CT of the cervical spine was performed without the administration of intravenous contrast. Multiplanar reformatted images are provided for review. Automated exposure control, iterative reconstruction, and/or weight based adjustment of the mA/kV was utilized to reduce the radiation dose to as low as reasonably achievable. COMPARISON: CT of the cervical spine dated 08/27/2023. CLINICAL HISTORY: Neck pain, acute, no red flags. PT BIB [facility] from home. PT was outside and fell. Fall was witnessed by son who states he hit his head. PT denies hitting head. PT is slightly confused which is not new but no dx of dementia. In the last 12 hrs. PT reportedly pulled out more of the wiring in his insulin  pump. CBG 598 on scene and minimal fluids given. FINDINGS: CERVICAL SPINE: BONES AND ALIGNMENT: No acute fracture or  traumatic malalignment. DEGENERATIVE CHANGES: Slight degenerative ventralisthesis at C3-4. Chronic degenerative disc disease at C5-6 and C6-7 with mild central spinal canal stenosis at both levels. SOFT TISSUES: No prevertebral soft tissue swelling. VASCULATURE: Moderate calcification within the carotid bulbs bilaterally. IMPRESSION: 1. No acute abnormality of the cervical spine related to the reported fall. 2. Chronic degenerative disc disease at C5-6 and C6-7 with mild central spinal canal stenosis at both levels. Electronically signed by: evalene coho 11/11/2023 03:57 PM EDT RP Workstation: [id number]   CT Head Wo Contrast Result Date: 11/11/2023 EXAM: CT HEAD WITHOUT CONTRAST 11/11/2023 03:50:08 PM TECHNIQUE: CT of the head was performed without the administration of intravenous contrast. Automated exposure control, iterative reconstruction, and/or weight based adjustment of the mA/kV was utilized to reduce the radiation dose to as low as reasonably achievable. COMPARISON: CT of the head dated 09/14/2023. CLINICAL HISTORY: Head trauma, moderate-severe. PT BIB [facility] from  home. PT was outside and fell. Fall was witnessed by son who states he hit his head. PT denies hitting head. PT is slightly confused which is not new but no dx of dementia. In the last 12 hrs. PT reportedly pulled out more of the wiring in his insulin  pump. CBG 598 on scene and minimal fluids given. FINDINGS: BRAIN AND VENTRICLES: No acute hemorrhage. Gray-white differentiation is preserved. No hydrocephalus. No extra-axial collection. No mass effect or midline shift. Age-related cerebral volume loss and mild periventricular white matter disease. ORBITS: No acute abnormality. SINUSES: No acute abnormality. SOFT TISSUES AND SKULL: No acute soft tissue abnormality. No skull fracture. Extensive dystrophic calcifications along the falx. Moderate vascular calcifications. The previously described subdural hematoma has resolved. IMPRESSION: 1. No acute intracranial abnormality related to the head trauma. 2. Age-related cerebral volume loss and mild periventricular white matter disease. 3. Extensive dystrophic calcifications along the falx and moderate vascular calcifications. Electronically signed by: evalene coho 11/11/2023 03:54 PM EDT RP Workstation: [id number]     Signature  -   Lavada Stank M.D on 11/12/2023 at 9:40 AM   -  To page go to [email or web address]

## 2023-11-12 NOTE — ED Notes (Signed)
 Patient headed to [facility]

## 2023-11-12 NOTE — Evaluation (Signed)
 Occupational Therapy Evaluation Patient Details Name: Nicholas Weiss MRN: [id number] DOB: [birth year] Today's Date: 11/12/2023   History of Present Illness   Pt is a 73 year old man admitted on 11/11/23 with hyperosmolar hyperglycemia. PMH: DM1 on insulin  pump, chronic orthostatic hypotension, PAF, cognitive impairment, seizures, CKD III, diabetic neuropathy, HLD, multiple myeloma, L shoulder fx June 2025 managed conservatively, SDH from fall June 2025.     Clinical Impressions Pt found standing in room with bed alarming and lines removed trying to dress to go home. Pt disoriented to all but himself. Difficult to redirect. Pt's partner, [name], arrived during session. Reports pt sometimes walks with a cane. She assists him for bathing, dressing and all IADLs. Pt has a long history of falls. Pt requires supervision for OOB mobility and min to max assist for ADLs. He is likely near his baseline. [name] reports they have applied for Medicaid benefits to acquire more help in the home or for ALF. No acute OT needs. RN and NT aware pt removed lines and is up in chair under the supervision of [name] who is assisting him with lunch.      If plan is discharge home, recommend the following:   A little help with walking and/or transfers;A lot of help with bathing/dressing/bathroom;Assistance with cooking/housework;Assistance with feeding;Direct supervision/assist for medications management;Direct supervision/assist for financial management;Assist for transportation;Help with stairs or ramp for entrance     Functional Status Assessment   Patient has not had a recent decline in their functional status     Equipment Recommendations   None recommended by OT     Recommendations for Other Services         Precautions/Restrictions   Precautions Precautions: Fall Recall of Precautions/Restrictions: Impaired Precaution/Restrictions Comments: h/o orthostatic hypotension Required Braces or  Orthoses: Sling Restrictions Weight Bearing Restrictions Per Provider Order: No (adhered to no WB through L shoulder)     Mobility Bed Mobility               General bed mobility comments: standing in room upon arrival    Transfers Overall transfer level: Needs assistance Equipment used: None Transfers: Sit to/from Stand Sit to Stand: Supervision           General transfer comment: supervision for safety      Balance Overall balance assessment: Needs assistance   Sitting balance-Leahy Scale: Good       Standing balance-Leahy Scale: Poor Standing balance comment: h/o of multiple falls with injury                           ADL either performed or assessed with clinical judgement   ADL Overall ADL's : At baseline                                             Vision Baseline Vision/History: 1 Wears glasses Ability to See in Adequate Light: 0 Adequate Patient Visual Report: No change from baseline       Perception         Praxis         Pertinent Vitals/Pain Pain Assessment Pain Assessment: Faces Faces Pain Scale: Hurts little more Pain Location: L shoulder Pain Descriptors / Indicators: Discomfort, Grimacing, Guarding Pain Intervention(s): Monitored during session, Repositioned     Extremity/Trunk Assessment Upper Extremity Assessment Upper Extremity Assessment: Right  hand dominant;LUE deficits/detail LUE Deficits / Details: painful shoulder, per his partner, they have been told it is healing, remains in sling LUE Coordination: decreased gross motor   Lower Extremity Assessment Lower Extremity Assessment: Defer to PT evaluation   Cervical / Trunk Assessment Cervical / Trunk Assessment: Kyphotic   Communication Communication Communication: No apparent difficulties   Cognition Arousal: Alert Behavior During Therapy: Impulsive, Restless Cognition: History of cognitive impairments             OT -  Cognition Comments: partner, [name], in room and reports pt is at baseline                 Following commands: Impaired Following commands impaired: Follows one step commands inconsistently     Cueing  General Comments   Cueing Techniques: Verbal cues;Gestural cues      Exercises     Shoulder Instructions      Home Living Family/patient expects to be discharged to:: Private residence Living Arrangements: Spouse/significant other;Children Available Help at Discharge: Family;Available 24 hours/day Type of Home: House Home Access: Stairs to enter [employer] of Steps: 1 Entrance Stairs-Rails: Can reach both Home Layout: One level     Bathroom Shower/Tub: Producer, television/film/video: Standard     Home Equipment: Cane - quad;Shower seat;Grab bars - toilet;Grab bars - tub/shower;Cane - single Librarian, academic (2 wheels)          Prior Functioning/Environment Prior Level of Function : Needs assist             Mobility Comments: uses his cane at times ADLs Comments: assisted for showering, dressing and all IADLs, pt with many falls    OT Problem List:     OT Treatment/Interventions:        OT Goals(Current goals can be found in the care plan section)   Acute Rehab OT Goals OT Goal Formulation: With family   OT Frequency:       Co-evaluation              AM-PAC OT 6 Clicks Daily Activity     Outcome Measure Help from another person eating meals?: A Little Help from another person taking care of personal grooming?: A Little Help from another person toileting, which includes using toliet, bedpan, or urinal?: A Lot Help from another person bathing (including washing, rinsing, drying)?: A Lot Help from another person to put on and taking off regular upper body clothing?: A Little Help from another person to put on and taking off regular lower body clothing?: A Lot 6 Click Score: 15   End of Session Equipment Utilized  During Treatment: Other (comment) (L UE sling) Nurse Communication: Other (comment) (aware pt found standing without IV, bed wet with urine)  Activity Tolerance: Patient tolerated treatment well Patient left: in chair;with family/visitor present;with call bell/phone within reach  OT Visit Diagnosis: Unsteadiness on feet (R26.81);Other symptoms and signs involving cognitive function                Time: 1435-1500 OT Time Calculation (min): 25 min Charges:  OT General Charges $OT Visit: 1 Visit OT Evaluation $OT Eval Moderate Complexity: 1 Mod OT Treatments $Self Care/Home Management : 8-22 mins  Mliss HERO, OTR/L Acute Rehabilitation Services Office: [phone]   Kennth Mliss Helling 11/12/2023, 3:08 PM

## 2023-11-12 NOTE — Care Management Obs Status (Signed)
 MEDICARE OBSERVATION STATUS NOTIFICATION   Patient Details  Name: Nicholas Weiss MRN: [id number] Date of Birth: 02-22-1951   Medicare Observation Status Notification Given:  Yes  Spoke with [name] about obs status and will leave copy in room   Zyron Deeley Crawford 11/12/2023, 1:14 PM

## 2023-11-12 NOTE — Progress Notes (Signed)
   11/12/23 0728  What Happened  Was fall witnessed? Yes  Who witnessed fall? RN and NT  Point of contact arm/shoulder  Was patient injured? Unsure  Provider Notification  Provider Name/Title Dr. Dennise  Date Provider Notified 11/12/23  Time Provider Notified 0730  Method of Notification Face-to-face  Notification Reason Fall    Patient had witnessed fall at 0725, provider notified and at bedside.

## 2023-11-12 NOTE — Inpatient Diabetes Management (Signed)
 Inpatient Diabetes Program Recommendations  AACE/ADA: New Consensus Statement on Inpatient Glycemic Control (2015)  Target Ranges:  Prepandial:   less than 140 mg/dL      Peak postprandial:   less than 180 mg/dL (1-2 hours)      Critically ill patients:  140 - 180 mg/dL   Lab Results  Component Value Date   GLUCAP 128 (H) 11/12/2023   HGBA1C 10.0 (H) 07/21/2023    Review of Glycemic Control  Latest Reference Range & Units 11/12/23 07:33 11/12/23 11:42  Glucose-Capillary 70 - 99 mg/dL 828 (H) 871 (H)  (H): Data is abnormally high Diabetes history: DM 1 Outpatient Diabetes medications:  Insulin  pump at home (T-slim) Basal rate 12am 0.25 units/hr (24hr = 8 units) Bolus settings ISF 60 I:C ratio 20 Target 120  Off pump regimen: Novolog  1-9 units tid with meals  Tresiba  5 units daily Current orders for Inpatient glycemic control:  Novolog  0-9 units tid with meals and HS Semglee  6 units daily  Inpatient Diabetes Program Recommendations:    In agreement with current plan.   Patient familiar to DM team. Multiple episodes of DKA related to disconnection issues. Follows with Dr Damian with last appointment 08/24/23. Met with the CDE for [employer] again for reeducation and reinforcement. Patient's wife, [name], assists with insulin  pump management. If patient and provider are in agreement would resume pump after discharge. For now, continue with SQ injections.   Thanks, Tinnie Minus, MSN, RNC-OB Diabetes Coordinator [phone] (8a-5p)

## 2023-11-12 NOTE — Progress Notes (Signed)
 Attempt made to reach patient's son Diem Pagnotta) to provide status update, however no answer at this time.

## 2023-11-12 NOTE — Plan of Care (Signed)
  Problem: Health Behavior/Discharge Planning: Goal: Ability to manage health-related needs will improve Outcome: Progressing   Problem: Skin Integrity: Goal: Risk for impaired skin integrity will decrease Outcome: Progressing   Problem: Tissue Perfusion: Goal: Adequacy of tissue perfusion will improve Outcome: Progressing   Problem: Cardiac: Goal: Ability to maintain an adequate cardiac output will improve Outcome: Progressing   Problem: Health Behavior/Discharge Planning: Goal: Ability to manage health-related needs will improve Outcome: Progressing   Problem: Fluid Volume: Goal: Ability to achieve a balanced intake and output will improve Outcome: Progressing   Problem: Metabolic: Goal: Ability to maintain appropriate glucose levels will improve Outcome: Progressing   Problem: Clinical Measurements: Goal: Diagnostic test results will improve Outcome: Progressing   Problem: Activity: Goal: Risk for activity intolerance will decrease Outcome: Progressing

## 2023-11-13 ENCOUNTER — Other Ambulatory Visit (HOSPITAL_COMMUNITY): Payer: Self-pay

## 2023-11-13 DIAGNOSIS — E1100 Type 2 diabetes mellitus with hyperosmolarity without nonketotic hyperglycemic-hyperosmolar coma (NKHHC): Secondary | ICD-10-CM | POA: Diagnosis not present

## 2023-11-13 LAB — BASIC METABOLIC PANEL WITH GFR
Anion gap: 7 (ref 5–15)
Anion gap: 6 (ref 5–15)
BUN: 27 mg/dL — ABNORMAL HIGH (ref 8–23)
BUN: 26 mg/dL — ABNORMAL HIGH (ref 8–23)
CO2: 25 mmol/L (ref 22–32)
CO2: 25 mmol/L (ref 22–32)
Calcium: 8.4 mg/dL — ABNORMAL LOW (ref 8.9–10.3)
Calcium: 8.3 mg/dL — ABNORMAL LOW (ref 8.9–10.3)
Chloride: 101 mmol/L (ref 98–111)
Chloride: 104 mmol/L (ref 98–111)
Creatinine, Ser: 1.36 mg/dL — ABNORMAL HIGH (ref 0.61–1.24)
Creatinine, Ser: 1.26 mg/dL — ABNORMAL HIGH (ref 0.61–1.24)
GFR, Estimated: 56 mL/min — ABNORMAL LOW (ref >60)
GFR, Estimated: >60 mL/min (ref >60)
Glucose, Bld: 199 mg/dL — ABNORMAL HIGH (ref 70–99)
Glucose, Bld: 101 mg/dL — ABNORMAL HIGH (ref 70–99)
Potassium: 3.8 mmol/L (ref 3.5–5.1)
Potassium: 3.8 mmol/L (ref 3.5–5.1)
Sodium: 133 mmol/L — ABNORMAL LOW (ref 135–145)
Sodium: 135 mmol/L (ref 135–145)

## 2023-11-13 LAB — MAGNESIUM
Magnesium: 1.6 mg/dL — ABNORMAL LOW (ref 1.7–2.4)
Magnesium: 1.5 mg/dL — ABNORMAL LOW (ref 1.7–2.4)

## 2023-11-13 LAB — GLUCOSE, CAPILLARY
Glucose-Capillary: 114 mg/dL — ABNORMAL HIGH (ref 70–99)
Glucose-Capillary: 200 mg/dL — ABNORMAL HIGH (ref 70–99)
Glucose-Capillary: 77 mg/dL (ref 70–99)

## 2023-11-13 LAB — PHOSPHORUS: Phosphorus: 2.8 mg/dL (ref 2.5–4.6)

## 2023-11-13 MED ORDER — MAGNESIUM SULFATE 4 GM/100ML IV SOLN
4.0000 g | Freq: Once | INTRAVENOUS | Status: AC
  Administered 2023-11-13: 4 g via INTRAVENOUS
  Filled 2023-11-13: qty 100

## 2023-11-13 MED ORDER — MAGNESIUM SULFATE 4 GM/100ML IV SOLN: 4.0000 g | Freq: Once | INTRAVENOUS | Status: DC

## 2023-11-13 MED ORDER — MIDODRINE HCL 5 MG PO TABS
5.0000 mg | ORAL_TABLET | Freq: Three times a day (TID) | ORAL | 0 refills | Status: DC
  Filled 2023-11-13: qty 90, 30d supply, fill #0

## 2023-11-13 MED ORDER — MAGNESIUM SULFATE 2 GM/50ML IV SOLN
2.0000 g | Freq: Once | INTRAVENOUS | Status: AC
  Administered 2023-11-13: 2 g via INTRAVENOUS
  Filled 2023-11-13: qty 50

## 2023-11-13 NOTE — Plan of Care (Signed)
  Problem: Coping: Goal: Ability to adjust to condition or change in health will improve Outcome: Progressing   Problem: Metabolic: Goal: Ability to maintain appropriate glucose levels will improve Outcome: Progressing   Problem: Nutritional: Goal: Maintenance of adequate nutrition will improve Outcome: Progressing   Problem: Skin Integrity: Goal: Risk for impaired skin integrity will decrease Outcome: Progressing   Problem: Tissue Perfusion: Goal: Adequacy of tissue perfusion will improve Outcome: Progressing   Problem: Cardiac: Goal: Ability to maintain an adequate cardiac output will improve Outcome: Progressing   Problem: Fluid Volume: Goal: Ability to achieve a balanced intake and output will improve Outcome: Progressing   Problem: Metabolic: Goal: Ability to maintain appropriate glucose levels will improve Outcome: Progressing   Problem: Nutritional: Goal: Maintenance of adequate nutrition will improve Outcome: Progressing   Problem: Education: Goal: Knowledge of General Education information will improve Description: Including pain rating scale, medication(s)/side effects and non-pharmacologic comfort measures Outcome: Progressing   Problem: Clinical Measurements: Goal: Diagnostic test results will improve Outcome: Progressing Goal: Cardiovascular complication will be avoided Outcome: Progressing   Problem: Activity: Goal: Risk for activity intolerance will decrease Outcome: Progressing

## 2023-11-13 NOTE — TOC Transition Note (Signed)
 Transition of Care [facility]) - Discharge Note   Patient Details  Name: Nicholas Weiss MRN: [id number] Date of Birth: [birth year]  Transition of [facility]) CM/SW Contact:  Marval Gell, RN Phone Number: 11/13/2023, 8:42 AM   Clinical Narrative:     Per [facility] patient is active w [facility], I have notified liaison of DC today.  Per OT eval patient has RW and DME at home.  No other TOC needs identified for DC    Final next level of care: Home w Home Health Services Barriers to Discharge: No Barriers Identified   Patient Goals and CMS Choice            Discharge Placement                       Discharge Plan and Services Additional resources added to the After Visit Summary for                  DME Arranged: N/A         HH Arranged: PT, RN HH Agency: [facility] Date [facility] Agency Contacted: 11/13/23 Time HH Agency Contacted: [phone] Representative spoke with at [facility] Agency: Cassius  Social Drivers of Health (SDOH) Interventions SDOH Screenings   Food Insecurity: No Food Insecurity (11/12/2023)  Housing: Low Risk  (11/12/2023)  Transportation Needs: No Transportation Needs (11/12/2023)  Utilities: Patient Declined (11/12/2023)  Alcohol Screen: Low Risk  (10/02/2020)  Depression (PHQ2-9): Low Risk  (11/10/2023)  Financial Resource Strain: Low Risk  (10/20/2023)   Received from [facility]  Physical Activity: Insufficiently Active (01/01/2020)  Social Connections: Unknown (11/12/2023)  Stress: No Stress Concern Present (01/01/2020)  Tobacco Use: Low Risk  (11/11/2023)  Recent Concern: Tobacco Use - Medium Risk (10/27/2023)   Received from [facility]     Readmission Risk Interventions    02/16/2023    3:56 PM 08/24/2022   10:57 AM 05/20/2022   11:41 AM  Readmission Risk Prevention Plan  Transportation Screening Complete Complete Complete  Medication Review Oceanographer) Complete Complete Complete  PCP or Specialist  appointment within 3-5 days of discharge Complete Complete Complete  HRI or Home Care Consult Complete Complete Complete  SW Recovery Care/Counseling Consult Not Complete    Palliative Care Screening Not Applicable  Complete  Skilled Nursing Facility Complete Complete Complete

## 2023-11-13 NOTE — Discharge Summary (Addendum)
 Nicholas Weiss FMW:[id number] DOB: 10/26/1950 DOA: 11/11/2023  PCP: Alla Amis, MD  Admit date: 11/11/2023  Discharge date: 11/13/2023  Admitted From: Home   Disposition:  Home   Recommendations for Outpatient Follow-up:   Follow up with PCP in 1-2 weeks  PCP Please obtain BMP/CBC, 2 view CXR in 1week,  (see Discharge instructions)   PCP Please follow up on the following pending results:    Home Health: PT, RN if qualifies   Equipment/Devices:  As below Consultations: None  Discharge Condition: Stable    CODE STATUS: Full    Diet Recommendation: Heart Healthy Low Carb  Diet Order             Diet Carb Modified Fluid consistency: Thin; Room service appropriate? Yes  Diet effective now                    Chief Complaint  Patient presents with   FALL ON THINNERS     Brief history of present illness from the day of admission and additional interim summary     73 y.o. male with history of diabetes mellitus type 1 on insulin  pump last hemoglobin A1c was 8.6 on October 25, 2023, chronic orthostatic hypotension, paroxysmal atrial fibrillation, seizures, mild cognitive impairment for which family is planning to follow-up with neurologist in the next few weeks was brought to the ER after patient had a fall today.  As per the patient's son and wife with whom I spoke patient fell when he was trying to get up.  Has been having recurrent falls was admitted in June 2025 for subdural hematoma after fall.  Also recently had fractured his left upper extremity which is being managed conservatively and he wears a sling.  Admitted to the hospital for potential malfunction of his insulin  pump causing nonketotic hyperosmolar state.                                                                  Hospital Course    Hyperosmolar nonketotic state in diabetes mellitus type 1 with poor outpatient glycemic control.  Likely from missing his insulin  dose due to his insulin  pump getting inadvertently disconnected.  Was treated appropriately, now transition to subcu sliding scale, CBG stable, he says his insulin  pump malfunctioned as his significant other pulled at it by mistake, it is otherwise functioning normal, requested to continue using his insulin  pump as before, check CBGs as before or q. ACHS and follow-up with PCP in 3 days.  Lab Results  Component Value Date   HGBA1C 10.0 (H) 07/21/2023    Acute on chronic kidney disease stage III with mild hyponatremia and hyperkalemia which I think will get corrected with insulin  and hydration.  Follow metabolic panel.  Close to baseline now. History of orthostatic  hypotension with recurrent falls over the last several weeks.  Continue midodrine  at increased dose, added low-dose Florinef , add TED stockings, PT OT monitor, symptom-free in supine position, written instructions provided for improving orthostatic hypotension, has been adequately hydrated, will be discharged home with home PT, RN and a rolling walker, PCP to continue to monitor in the outpatient setting postdischarge. History of A-fib on Eliquis .  Confirmed with patient's wife that Eliquis  was restarted after his recent subdural hematoma. History of seizures on Keppra  has not had any recent seizures per patient's wife.  Dose may need to be closely monitored given the chronic kidney disease. History of multiple myeloma on Pomalyst  per oncology.  Needs to be started from tomorrow as per the wife.  On acyclovir  as prophylaxis. Chronic anemia likely related to the kidney disease and multiple myeloma.  Follow CBC. Diabetic neuropathy on gabapentin  confirmed dose with patient wife.  Dose needs to be adjusted if creatinine worsens. Hyperlipidemia on statins. Mild cognitive impairment confirmed with family.  Has  follow-up with neurologist as per the family in the next few weeks for further workup. History of subdural hematoma and recent left upper extremity fracture.  Twisted to follow-up with his orthopedics as before and continue wearing left arm sling as before. Hypomagnesemia.  Replaced.  PCP to monitor.      Discharge diagnosis     Principal Problem:   Hyperosmolar hyperglycemic state (HHS) (HCC) Active Problems:   Hyponatremia   CKD (chronic kidney disease) stage 3, GFR 30-59 ml/min (HCC)   Seizure disorder (HCC)   Dysautonomia orthostatic hypotension with recurrent syncope   Multiple myeloma (HCC)   MCI (mild cognitive impairment) with memory loss    Discharge instructions    Discharge Instructions     Discharge instructions   Complete by: As directed    Follow with Primary MD Alla Amis, MD in 3-4 days   Get CBC, CMP, Magnesium , 2 view Chest X ray -  checked next visit with your primary MD    Activity: As tolerated with Full fall precautions use walker/cane & assistance as needed, continue wearing sling in the left arm as before.  Non-Pharmacologic Reccomendations for Orthostatic Hypotension - Arising slowly, in stages, from supine to seated to standing. This maneuver is most important in the morning, when orthostatic tolerance is lowest - You must sit at a stationary position for 5 minutes and do leg extension exercises as taught for 5 minutes before you start walking from a resting position or after getting up from a bed, once you stand up , stand at that spot for 3-5 minutes while holding on to a wall-bed-heavy furniture and then walk only if you are not dizzy, using a  walker at all times, if you still get dizzy sit down, and call for help.  Wear Compression Stockings whenever you are getting out of the bed.               Disposition Home    Diet: Heart Healthy Low Carb, check CBGs QAC-HS -follow-up with your PCP for insulin  pump and glucose monitoring.  Special  Instructions: If you have smoked or chewed Tobacco  in the last 2 yrs please stop smoking, stop any regular Alcohol  and or any Recreational drug use.  On your next visit with your primary care physician please Get Medicines reviewed and adjusted.  Please request your Prim.MD to go over all Hospital Tests and Procedure/Radiological results at the follow up, please get all Hospital records sent  to your Prim MD by signing hospital release before you go home.  If you experience worsening of your admission symptoms, develop shortness of breath, life threatening emergency, suicidal or homicidal thoughts you must seek medical attention immediately by calling 911 or calling your MD immediately  if symptoms less severe.  You Must read complete instructions/literature along with all the possible adverse reactions/side effects for all the Medicines you take and that have been prescribed to you. Take any new Medicines after you have completely understood and accpet all the possible adverse reactions/side effects.   Do not drive when taking Pain medications.  Do not take more than prescribed Pain, Sleep and Anxiety Medications  Wear Seat belts while driving.   Increase activity slowly   Complete by: As directed        Discharge Medications   Allergies as of 11/13/2023       Reactions   Penicillins Other (See Comments)   Childhood allergy -tolerated amoxil  03/2022 Unknown reaction Has patient had a PCN reaction causing immediate rash, facial/tongue/throat swelling, SOB or lightheadedness with hypotension: No Has patient had a PCN reaction causing severe rash involving mucus membranes or skin necrosis: No Has patient had a PCN reaction that required hospitalization No Has patient had a PCN reaction occurring within the last 10 years: No If all of the above answers are NO, then may proceed with Cephalosporin use.        Medication List     STOP taking these medications    acyclovir  400 MG  tablet Commonly known as: ZOVIRAX    bisacodyl  5 MG EC tablet Generic drug: bisacodyl    insulin  aspart 100 UNIT/ML injection Commonly known as: novoLOG    insulin  glargine-yfgn 100 UNIT/ML injection Commonly known as: SEMGLEE    oxyCODONE -acetaminophen  5-325 MG tablet Commonly known as: Percocet       TAKE these medications    acetaminophen  325 MG tablet Commonly known as: TYLENOL  Take 2 tablets (650 mg total) by mouth every 6 (six) hours as needed for mild pain (pain score 1-3) or fever.   apixaban  5 MG Tabs tablet Commonly known as: ELIQUIS  Take 5 mg by mouth 2 (two) times daily.   atorvastatin  80 MG tablet Commonly known as: LIPITOR  Take 1 tablet (80 mg total) by mouth at bedtime. Hold while taking Paxlovid    Dexcom G7 Sensor Misc 1 each by Does not apply route 4 (four) times daily -  before meals and at bedtime.   fludrocortisone  0.1 MG tablet Commonly known as: FLORINEF  Take 1 tablet (0.1 mg total) by mouth daily as needed. Skip the dose if SBP >130   gabapentin  300 MG capsule Commonly known as: NEURONTIN  Take 1 capsule (300 mg total) by mouth 2 (two) times daily.   insulin  lispro 100 UNIT/ML injection Commonly known as: HUMALOG INJECT 150 UNITS EVERY 3 DAYS VIA INSULIN  PUMP AS DIRECTED   iron  polysaccharides 150 MG capsule Commonly known as: NIFEREX Take 1 capsule (150 mg total) by mouth daily.   latanoprost  0.005 % ophthalmic solution Commonly known as: XALATAN  1 drop at bedtime.   levETIRAcetam  500 MG tablet Commonly known as: KEPPRA  Take 1 tablet by mouth twice daily   midodrine  5 MG tablet Commonly known as: PROAMATINE  Take 1 tablet (5 mg total) by mouth 3 (three) times daily. What changed:  medication strength how much to take   pomalidomide  2 MG capsule Commonly known as: POMALYST  Take 1 capsule (2 mg total) by mouth daily. Take for 21 days, the hold  for 7 days. Repeat every 28 days.               Durable Medical Equipment  (From  admission, onward)           Start     Ordered   11/13/23 0819  For home use only DME Walker rolling  Once       Comments: 5 wheel  Question Answer Comment  Walker: With 5 Inch Wheels   Patient needs a walker to treat with the following condition Weakness      11/13/23 0818             Follow-up Information     Alla Amis, MD. Schedule an appointment as soon as possible for a visit in 3 day(s).   Specialty: Family Medicine Contact information: [address] [facility] [place] [address] [phone]         [facility], [facility] Follow up.   Specialty: Home Health Services Why: home health services will continue Contact information: [address] [place] [address] [phone]                 Major procedures and Radiology Reports - PLEASE review detailed and final reports thoroughly  -      DG Chest Port 1 View Result Date: 11/11/2023 EXAM: 1 VIEW XRAY OF THE CHEST 11/11/2023 03:33:00 PM COMPARISON: AP radiograph of the chest dated 09/23/2023. CLINICAL HISTORY: Fall trauma. FINDINGS: LUNGS AND PLEURA: No focal pulmonary opacity. No pulmonary edema. No pleural effusion. No pneumothorax. HEART AND MEDIASTINUM: No acute abnormality of the cardiac and mediastinal silhouettes. BONES AND SOFT TISSUES: No acute osseous abnormality. IMPRESSION: 1. No acute process. Electronically signed by: evalene coho 11/11/2023 03:58 PM EDT RP Workstation: [id number]   CT Cervical Spine Wo Contrast Result Date: 11/11/2023 EXAM: CT CERVICAL SPINE WITHOUT CONTRAST 11/11/2023 03:50:08 PM TECHNIQUE: CT of the cervical spine was performed without the administration of intravenous contrast. Multiplanar reformatted images are provided for review. Automated exposure control, iterative reconstruction, and/or weight based adjustment of the mA/kV was utilized to reduce the radiation dose to as low as reasonably achievable. COMPARISON: CT of the  cervical spine dated 08/27/2023. CLINICAL HISTORY: Neck pain, acute, no red flags. PT BIB [facility] from home. PT was outside and fell. Fall was witnessed by son who states he hit his head. PT denies hitting head. PT is slightly confused which is not new but no dx of dementia. In the last 12 hrs. PT reportedly pulled out more of the wiring in his insulin  pump. CBG 598 on scene and minimal fluids given. FINDINGS: CERVICAL SPINE: BONES AND ALIGNMENT: No acute fracture or traumatic malalignment. DEGENERATIVE CHANGES: Slight degenerative ventralisthesis at C3-4. Chronic degenerative disc disease at C5-6 and C6-7 with mild central spinal canal stenosis at both levels. SOFT TISSUES: No prevertebral soft tissue swelling. VASCULATURE: Moderate calcification within the carotid bulbs bilaterally. IMPRESSION: 1. No acute abnormality of the cervical spine related to the reported fall. 2. Chronic degenerative disc disease at C5-6 and C6-7 with mild central spinal canal stenosis at both levels. Electronically signed by: evalene coho 11/11/2023 03:57 PM EDT RP Workstation: [id number]   CT Head Wo Contrast Result Date: 11/11/2023 EXAM: CT HEAD WITHOUT CONTRAST 11/11/2023 03:50:08 PM TECHNIQUE: CT of the head was performed without the administration of intravenous contrast. Automated exposure control, iterative reconstruction, and/or weight based adjustment of the mA/kV was utilized to reduce the radiation dose to as low as reasonably  achievable. COMPARISON: CT of the head dated 09/14/2023. CLINICAL HISTORY: Head trauma, moderate-severe. PT BIB [facility] from home. PT was outside and fell. Fall was witnessed by son who states he hit his head. PT denies hitting head. PT is slightly confused which is not new but no dx of dementia. In the last 12 hrs. PT reportedly pulled out more of the wiring in his insulin  pump. CBG 598 on scene and minimal fluids given. FINDINGS: BRAIN AND VENTRICLES: No acute hemorrhage. Gray-white differentiation  is preserved. No hydrocephalus. No extra-axial collection. No mass effect or midline shift. Age-related cerebral volume loss and mild periventricular white matter disease. ORBITS: No acute abnormality. SINUSES: No acute abnormality. SOFT TISSUES AND SKULL: No acute soft tissue abnormality. No skull fracture. Extensive dystrophic calcifications along the falx. Moderate vascular calcifications. The previously described subdural hematoma has resolved. IMPRESSION: 1. No acute intracranial abnormality related to the head trauma. 2. Age-related cerebral volume loss and mild periventricular white matter disease. 3. Extensive dystrophic calcifications along the falx and moderate vascular calcifications. Electronically signed by: evalene coho 11/11/2023 03:54 PM EDT RP Workstation: [id number]    Micro Results    No results found for this or any previous visit (from the past 240 hours).  Today   Subjective    Nicholas Weiss today has no headache,no chest abdominal pain,no new weakness tingling or numbness, feels much better wants to go home today.    Objective   Blood pressure 140/85, pulse 62, temperature 98.2 F (36.8 C), temperature source Oral, resp. rate 13, height 5' 10 (1.778 m), weight 63.3 kg, SpO2 100%.   Intake/Output Summary (Last 24 hours) at 11/13/2023 1043 Last data filed at 11/13/2023 0500 Gross per 24 hour  Intake 795 ml  Output 300 ml  Net 495 ml    Exam  Awake Alert, No new F.N deficits,    [place].AT,PERRAL Supple Neck,   Symmetrical Chest wall movement, Good air movement bilaterally, CTAB RRR,No Gallops,   +ve B.Sounds, Abd Soft, Non tender,  No Cyanosis, Clubbing or edema    Data Review   Recent Labs  Lab 11/10/23 0832 11/11/23 1523  WBC 3.2* 4.1  HGB 8.4* 9.7*  HCT 25.7* 30.5*  PLT 262 194  MCV 97.7 100.0  MCH 31.9 31.8  MCHC 32.7 31.8  RDW 16.9* 17.1*  LYMPHSABS 1.2 0.7  MONOABS 0.3 0.6  EOSABS 0.1 0.0  BASOSABS 0.0 0.0    Recent Labs  Lab  11/10/23 0832 11/11/23 1523 11/11/23 2115 11/13/23 0044 11/13/23 0622  NA 133* 129* 136 133* 135  K 4.5 5.2* 3.9 3.8 3.8  CL 99 96* 103 101 104  CO2 28 21* 25 25 25   ANIONGAP 6 12 8 7 6   GLUCOSE 120* 712* 232* 199* 101*  BUN 30* 43* 36* 27* 26*  CREATININE 1.36* 1.96* 1.57* 1.36* 1.26*  AST 24 26  --   --   --   ALT 28 29  --   --   --   ALKPHOS 72 72  --   --   --   BILITOT 0.7 1.0  --   --   --   ALBUMIN 3.2* 3.1*  --   --   --   MG  --   --   --  1.6* 1.5*  PHOS  --   --   --   --  2.8  CALCIUM  8.9 9.1 8.9 8.4* 8.3*    Total Time in preparing paper work, data evaluation and todays  exam - 35 minutes  Signature  -    Lavada Stank M.D on 11/13/2023 at 10:43 AM   -  To page go to [email or web address]

## 2023-11-13 NOTE — Evaluation (Signed)
 Physical Therapy Evaluation Patient Details Name: Nicholas Weiss MRN: [id number] DOB: 1950-12-11 Today's Date: 11/13/2023  History of Present Illness  Pt is a 76 yom admitted on 11/11/23 with hyperosmolar hyperglycemia. PMH: DM1 on insulin  pump, chronic orthostatic hypotension, PAF, cognitive impairment, seizures, CKD III, diabetic neuropathy, HLD, multiple myeloma, L shoulder fx June 2025 managed conservatively, SDH from fall June 2025.  Clinical Impression  Patient met in supine, no family present however patient remains unreliable historian secondary to discrepancies in PLOF. Patient reports independently ambulating at baseline with use of SPC. Has been wearing sling on L UE since fracture in June 2025 due to fall. Patient also reports few falls however per chart review, significant fall history. Patient presents today with poor safety awareness, impaired standing balance, and poor gait sequencing.  Patient was positive for orthostatic hypotension and poor judgement for safety precautions. Several instances of static standing, patient lost balance posteriorly with no ability to self correct, requiring mod/maxA. Able to ambulate 15 feet within room with min/modA for stability. Patient remains limited by orthostatic hypotension and L UE limitations making patient significant risk for fall. Family not present however if patient has 24/7 supervision/assist, would recommend home at w/c level for mobility and HHPT. If 24/7 assist is not available, patient would benefit from post-acute rehab < 3 hours/day.       If plan is discharge home, recommend the following: A little help with walking and/or transfers;A little help with bathing/dressing/bathroom;Assistance with cooking/housework;Assist for transportation;Help with stairs or ramp for entrance;Direct supervision/assist for medications management   Can travel by private vehicle        Equipment Recommendations Other (comment) (If patient were to d/c  home at this time, recommend w/c as safest mobility due to significant risk for falls and orthostatic hypotension.)  Recommendations for Other Services       Functional Status Assessment Patient has had a recent decline in their functional status and demonstrates the ability to make significant improvements in function in a reasonable and predictable amount of time.     Precautions / Restrictions Precautions Precautions: Fall Recall of Precautions/Restrictions: Impaired Precaution/Restrictions Comments: h/o orthostatic hypotension Required Braces or Orthoses: Sling (L UE) Restrictions Weight Bearing Restrictions Per Provider Order: No      Mobility  Bed Mobility Overal bed mobility: Modified Independent                  Transfers Overall transfer level: Needs assistance Equipment used: Rolling walker (2 wheels) Transfers: Sit to/from Stand Sit to Stand: Min assist, Contact guard assist           General transfer comment: STS transfers from EOB x 4 reps. Patient with difficulty coming to stance each rep, requiring minA at times to achieve upright position    Ambulation/Gait Ambulation/Gait assistance: Min assist Gait Distance (Feet): 15 Feet Assistive device: IV Pole     Gait velocity interpretation: <1.31 ft/sec, indicative of household ambulator   General Gait Details: PT with close guarding and minA for stability secondary to poor stability overall. Patient with orthostatic hypotension and poor safety awareness resulting in limited ability to follow safety cues. Posterior leaning with gait, moderate instability in single leg stance, reduced step length.  Stairs            Wheelchair Mobility     Tilt Bed    Modified Rankin (Stroke Patients Only)       Balance Overall balance assessment: Needs assistance Sitting-balance support: No upper extremity supported Sitting  balance-Leahy Scale: Good     Standing balance support: Single extremity  supported Standing balance-Leahy Scale: Poor Standing balance comment: Poor standing stability with tendency to lose balance posteriorly without ability to self correct. Patient required mod/maxA for eccentric descent several times this session secondary to poor awareness. Wears sling on L UE and only able to use R UE for stability.                             Pertinent Vitals/Pain Pain Assessment Pain Assessment: No/denies pain Pain Location: L shoulder Pain Descriptors / Indicators: Aching Pain Intervention(s): Monitored during session, Repositioned, Other (comment) (Repositioned with sling)    Home Living Family/patient expects to be discharged to:: Private residence Living Arrangements: Spouse/significant other;Children Available Help at Discharge: Family;Available 24 hours/day Type of Home: House Home Access: Stairs to enter Entrance Stairs-Rails: Can reach both Entrance Stairs-Number of Steps: 1   Home Layout: One level Home Equipment: Cane - quad;Shower seat;Grab bars - toilet;Grab bars - tub/shower;Cane - single Librarian, academic (2 wheels) Additional Comments: uses SPC in RUE for gait.    Prior Function Prior Level of Function : Needs assist             Mobility Comments: PLOF obtained from chart review and from patient however patient is unreliable historian at times. Reports use of SPC for ambulation and wears sling on L UE. Significant history of falls. Per patient, lives with his girlfriend and son but there seems to be a lot of new people now ADLs Comments: assisted for showering, dressing and all IADLs, pt with many falls     Extremity/Trunk Assessment        Lower Extremity Assessment Lower Extremity Assessment: Generalized weakness    Cervical / Trunk Assessment Cervical / Trunk Assessment: Kyphotic  Communication   Communication Communication: No apparent difficulties    Cognition Arousal: Alert Behavior During Therapy:  Impulsive, Restless   PT - Cognitive impairments: No family/caregiver present to determine baseline, Safety/Judgement, Memory                         Following commands: Impaired Following commands impaired: Follows one step commands inconsistently     Cueing Cueing Techniques: Verbal cues, Gestural cues     General Comments General comments (skin integrity, edema, etc.): Orthostatic hypotension (+) with assessment. Denied symptoms however progressive worsening of balance and increased fall risk.    Exercises     Assessment/Plan    PT Assessment Patient needs continued PT services  PT Problem List Decreased strength;Decreased knowledge of use of DME;Decreased knowledge of precautions;Decreased mobility;Decreased balance;Decreased activity tolerance;Decreased safety awareness       PT Treatment Interventions DME instruction;Gait training;Functional mobility training;Therapeutic activities;Therapeutic exercise;Balance training;Neuromuscular re-education;Patient/family education;Stair training    PT Goals (Current goals can be found in the Care Plan section)  Acute Rehab PT Goals Patient Stated Goal: To return home PT Goal Formulation: With patient Time For Goal Achievement: 11/27/23 Potential to Achieve Goals: Good    Frequency Min 2X/week     Co-evaluation               AM-PAC PT 6 Clicks Mobility  Outcome Measure Help needed turning from your back to your side while in a flat bed without using bedrails?: None Help needed moving from lying on your back to sitting on the side of a flat bed without using bedrails?: None Help needed moving  to and from a bed to a chair (including a wheelchair)?: A Little Help needed standing up from a chair using your arms (e.g., wheelchair or bedside chair)?: A Little Help needed to walk in hospital room?: A Lot Help needed climbing 3-5 steps with a railing? : Total 6 Click Score: 17    End of Session Equipment Utilized  During Treatment: Gait belt;Other (comment) (L UE sling) Activity Tolerance: Patient tolerated treatment well Patient left: in bed;with call bell/phone within reach;with bed alarm set;Other (comment) (declined chair) Nurse Communication: Mobility status;Other (comment) (poor safety awareness, fall risk) PT Visit Diagnosis: Unsteadiness on feet (R26.81);Other abnormalities of gait and mobility (R26.89);Difficulty in walking, not elsewhere classified (R26.2);Repeated falls (R29.6);Muscle weakness (generalized) (M62.81);History of falling (Z91.81)    Time: 9141-9066 PT Time Calculation (min) (ACUTE ONLY): 35 min   Charges:   PT Evaluation $PT Eval Moderate Complexity: 1 Mod PT Treatments $Therapeutic Activity: 23-37 mins PT General Charges $$ ACUTE PT VISIT: 1 Visit         Sherryle Sonterra, PT, DPT [facility] Acute Rehabilitation Office: [phone]   Sherryle VEAR [place] 11/13/2023, 11:43 AM

## 2023-11-13 NOTE — Discharge Instructions (Addendum)
 Follow with Primary MD Alla Amis, MD in 3-4 days   Get CBC, CMP, Magnesium , 2 view Chest X ray -  checked next visit with your primary MD    Activity: As tolerated with Full fall precautions use walker/cane & assistance as needed, continue wearing sling in the left arm as before.  Non-Pharmacologic Reccomendations for Orthostatic Hypotension - Arising slowly, in stages, from supine to seated to standing. This maneuver is most important in the morning, when orthostatic tolerance is lowest - You must sit at a stationary position for 5 minutes and do leg extension exercises as taught for 5 minutes before you start walking from a resting position or after getting up from a bed, once you stand up , stand at that spot for 3-5 minutes while holding on to a wall-bed-heavy furniture and then walk only if you are not dizzy, using a  walker at all times, if you still get dizzy sit down, and call for help.  Wear Compression Stockings whenever you are getting out of the bed.               Disposition Home    Diet: Heart Healthy Low Carb, check CBGs QAC-HS -follow-up with your PCP for insulin  pump and glucose monitoring.  Special Instructions: If you have smoked or chewed Tobacco  in the last 2 yrs please stop smoking, stop any regular Alcohol  and or any Recreational drug use.  On your next visit with your primary care physician please Get Medicines reviewed and adjusted.  Please request your Prim.MD to go over all Hospital Tests and Procedure/Radiological results at the follow up, please get all Hospital records sent to your Prim MD by signing hospital release before you go home.  If you experience worsening of your admission symptoms, develop shortness of breath, life threatening emergency, suicidal or homicidal thoughts you must seek medical attention immediately by calling 911 or calling your MD immediately  if symptoms less severe.  You Must read complete instructions/literature along with all  the possible adverse reactions/side effects for all the Medicines you take and that have been prescribed to you. Take any new Medicines after you have completely understood and accpet all the possible adverse reactions/side effects.   Do not drive when taking Pain medications.  Do not take more than prescribed Pain, Sleep and Anxiety Medications  Wear Seat belts while driving.

## 2023-11-15 ENCOUNTER — Emergency Department

## 2023-11-15 ENCOUNTER — Other Ambulatory Visit: Payer: Self-pay

## 2023-11-15 ENCOUNTER — Inpatient Hospital Stay
Admission: EM | Admit: 2023-11-15 | Discharge: 2023-11-26 | DRG: 086 | Disposition: A | Discharge status: Skilled Nursing Facility | Attending: Internal Medicine | Admitting: Internal Medicine

## 2023-11-15 ENCOUNTER — Observation Stay

## 2023-11-15 ENCOUNTER — Encounter: Payer: Self-pay | Admitting: Emergency Medicine

## 2023-11-15 DIAGNOSIS — S06341A Traumatic hemorrhage of right cerebrum with loss of consciousness of 30 minutes or less, initial encounter: Principal | ICD-10-CM | POA: Diagnosis present

## 2023-11-15 DIAGNOSIS — S06340D Traumatic hemorrhage of right cerebrum without loss of consciousness, subsequent encounter: Secondary | ICD-10-CM | POA: Diagnosis not present

## 2023-11-15 DIAGNOSIS — I252 Old myocardial infarction: Secondary | ICD-10-CM

## 2023-11-15 DIAGNOSIS — W19XXXA Unspecified fall, initial encounter: Secondary | ICD-10-CM | POA: Diagnosis present

## 2023-11-15 DIAGNOSIS — Z79899 Other long term (current) drug therapy: Secondary | ICD-10-CM

## 2023-11-15 DIAGNOSIS — I482 Chronic atrial fibrillation, unspecified: Secondary | ICD-10-CM | POA: Diagnosis present

## 2023-11-15 DIAGNOSIS — I10 Essential (primary) hypertension: Secondary | ICD-10-CM | POA: Diagnosis present

## 2023-11-15 DIAGNOSIS — I951 Orthostatic hypotension: Secondary | ICD-10-CM | POA: Diagnosis present

## 2023-11-15 DIAGNOSIS — I619 Nontraumatic intracerebral hemorrhage, unspecified: Secondary | ICD-10-CM | POA: Diagnosis present

## 2023-11-15 DIAGNOSIS — Z8249 Family history of ischemic heart disease and other diseases of the circulatory system: Secondary | ICD-10-CM

## 2023-11-15 DIAGNOSIS — Z681 Body mass index (BMI) 19 or less, adult: Secondary | ICD-10-CM

## 2023-11-15 DIAGNOSIS — E7800 Pure hypercholesterolemia, unspecified: Secondary | ICD-10-CM | POA: Diagnosis present

## 2023-11-15 DIAGNOSIS — R296 Repeated falls: Secondary | ICD-10-CM | POA: Diagnosis present

## 2023-11-15 DIAGNOSIS — N182 Chronic kidney disease, stage 2 (mild): Secondary | ICD-10-CM | POA: Diagnosis present

## 2023-11-15 DIAGNOSIS — D68318 Other hemorrhagic disorder due to intrinsic circulating anticoagulants, antibodies, or inhibitors: Secondary | ICD-10-CM | POA: Diagnosis present

## 2023-11-15 DIAGNOSIS — G40909 Epilepsy, unspecified, not intractable, without status epilepticus: Secondary | ICD-10-CM | POA: Diagnosis present

## 2023-11-15 DIAGNOSIS — D638 Anemia in other chronic diseases classified elsewhere: Secondary | ICD-10-CM | POA: Diagnosis present

## 2023-11-15 DIAGNOSIS — R55 Syncope and collapse: Secondary | ICD-10-CM | POA: Diagnosis not present

## 2023-11-15 DIAGNOSIS — Z7901 Long term (current) use of anticoagulants: Secondary | ICD-10-CM

## 2023-11-15 DIAGNOSIS — I615 Nontraumatic intracerebral hemorrhage, intraventricular: Secondary | ICD-10-CM | POA: Diagnosis not present

## 2023-11-15 DIAGNOSIS — E871 Hypo-osmolality and hyponatremia: Secondary | ICD-10-CM | POA: Diagnosis present

## 2023-11-15 DIAGNOSIS — E1022 Type 1 diabetes mellitus with diabetic chronic kidney disease: Secondary | ICD-10-CM | POA: Diagnosis present

## 2023-11-15 DIAGNOSIS — Z89422 Acquired absence of other left toe(s): Secondary | ICD-10-CM

## 2023-11-15 DIAGNOSIS — Y92019 Unspecified place in single-family (private) house as the place of occurrence of the external cause: Secondary | ICD-10-CM

## 2023-11-15 DIAGNOSIS — Z794 Long term (current) use of insulin: Secondary | ICD-10-CM

## 2023-11-15 DIAGNOSIS — W1830XA Fall on same level, unspecified, initial encounter: Secondary | ICD-10-CM | POA: Diagnosis present

## 2023-11-15 DIAGNOSIS — S42412S Displaced simple supracondylar fracture without intercondylar fracture of left humerus, sequela: Secondary | ICD-10-CM

## 2023-11-15 DIAGNOSIS — Z7952 Long term (current) use of systemic steroids: Secondary | ICD-10-CM

## 2023-11-15 DIAGNOSIS — Z9181 History of falling: Secondary | ICD-10-CM

## 2023-11-15 DIAGNOSIS — E440 Moderate protein-calorie malnutrition: Secondary | ICD-10-CM | POA: Diagnosis present

## 2023-11-15 DIAGNOSIS — Z8616 Personal history of COVID-19: Secondary | ICD-10-CM

## 2023-11-15 DIAGNOSIS — Z8673 Personal history of transient ischemic attack (TIA), and cerebral infarction without residual deficits: Secondary | ICD-10-CM

## 2023-11-15 DIAGNOSIS — I129 Hypertensive chronic kidney disease with stage 1 through stage 4 chronic kidney disease, or unspecified chronic kidney disease: Secondary | ICD-10-CM | POA: Diagnosis present

## 2023-11-15 DIAGNOSIS — E1069 Type 1 diabetes mellitus with other specified complication: Secondary | ICD-10-CM | POA: Diagnosis present

## 2023-11-15 DIAGNOSIS — E1065 Type 1 diabetes mellitus with hyperglycemia: Secondary | ICD-10-CM | POA: Diagnosis present

## 2023-11-15 DIAGNOSIS — G3184 Mild cognitive impairment, so stated: Secondary | ICD-10-CM | POA: Diagnosis present

## 2023-11-15 DIAGNOSIS — Z880 Allergy status to penicillin: Secondary | ICD-10-CM

## 2023-11-15 DIAGNOSIS — D631 Anemia in chronic kidney disease: Secondary | ICD-10-CM | POA: Diagnosis present

## 2023-11-15 DIAGNOSIS — E1040 Type 1 diabetes mellitus with diabetic neuropathy, unspecified: Secondary | ICD-10-CM | POA: Diagnosis present

## 2023-11-15 DIAGNOSIS — E10649 Type 1 diabetes mellitus with hypoglycemia without coma: Secondary | ICD-10-CM | POA: Diagnosis not present

## 2023-11-15 DIAGNOSIS — Z9641 Presence of insulin pump (external) (internal): Secondary | ICD-10-CM | POA: Diagnosis present

## 2023-11-15 LAB — CBC
HCT: 28.3 % — ABNORMAL LOW (ref 39.0–52.0)
Hemoglobin: 9.2 g/dL — ABNORMAL LOW (ref 13.0–17.0)
MCH: 31.7 pg (ref 26.0–34.0)
MCHC: 32.5 g/dL (ref 30.0–36.0)
MCV: 97.6 fL (ref 80.0–100.0)
Platelets: 237 K/uL (ref 150–400)
RBC: 2.9 MIL/uL — ABNORMAL LOW (ref 4.22–5.81)
RDW: 17.2 % — ABNORMAL HIGH (ref 11.5–15.5)
WBC: 4.7 K/uL (ref 4.0–10.5)
nRBC: 0.4 % — ABNORMAL HIGH (ref 0.0–0.2)

## 2023-11-15 LAB — MULTIPLE MYELOMA PANEL, SERUM
Albumin SerPl Elph-Mcnc: 3.6 g/dL (ref 2.9–4.4)
Albumin/Glob SerPl: 0.7 (ref 0.7–1.7)
Alpha 1: 0.3 g/dL (ref 0.0–0.4)
Alpha2 Glob SerPl Elph-Mcnc: 0.6 g/dL (ref 0.4–1.0)
B-Globulin SerPl Elph-Mcnc: 0.7 g/dL (ref 0.7–1.3)
Gamma Glob SerPl Elph-Mcnc: 3.9 g/dL — ABNORMAL HIGH (ref 0.4–1.8)
Globulin, Total: 5.4 g/dL — ABNORMAL HIGH (ref 2.2–3.9)
IgA: 8 mg/dL — ABNORMAL LOW (ref 61–437)
IgG (Immunoglobin G), Serum: 4788 mg/dL — ABNORMAL HIGH (ref 603–1613)
IgM (Immunoglobulin M), Srm: <5 mg/dL — ABNORMAL LOW (ref 20–172)
M Protein SerPl Elph-Mcnc: 3.7 g/dL — ABNORMAL HIGH
Total Protein ELP: 9 g/dL — ABNORMAL HIGH (ref 6.0–8.5)

## 2023-11-15 LAB — COMPREHENSIVE METABOLIC PANEL WITH GFR
ALT: 37 U/L (ref 0–44)
AST: 37 U/L (ref 15–41)
Albumin: 2.9 g/dL — ABNORMAL LOW (ref 3.5–5.0)
Alkaline Phosphatase: 72 U/L (ref 38–126)
Anion gap: 9 (ref 5–15)
BUN: 25 mg/dL — ABNORMAL HIGH (ref 8–23)
CO2: 26 mmol/L (ref 22–32)
Calcium: 8.2 mg/dL — ABNORMAL LOW (ref 8.9–10.3)
Chloride: 101 mmol/L (ref 98–111)
Creatinine, Ser: 1.32 mg/dL — ABNORMAL HIGH (ref 0.61–1.24)
GFR, Estimated: 58 mL/min — ABNORMAL LOW (ref >60)
Glucose, Bld: 129 mg/dL — ABNORMAL HIGH (ref 70–99)
Potassium: 4 mmol/L (ref 3.5–5.1)
Sodium: 136 mmol/L (ref 135–145)
Total Bilirubin: 0.7 mg/dL (ref 0.0–1.2)
Total Protein: 8.2 g/dL — ABNORMAL HIGH (ref 6.5–8.1)

## 2023-11-15 LAB — CBG MONITORING, ED: Glucose-Capillary: 295 mg/dL — ABNORMAL HIGH (ref 70–99)

## 2023-11-15 LAB — TROPONIN I (HIGH SENSITIVITY)
Troponin I (High Sensitivity): 13 ng/L (ref <18)
Troponin I (High Sensitivity): 13 ng/L (ref <18)

## 2023-11-15 LAB — MRSA NEXT GEN BY PCR, NASAL: MRSA by PCR Next Gen: NOT DETECTED

## 2023-11-15 MED ORDER — OXYCODONE-ACETAMINOPHEN 5-325 MG PO TABS
1.0000 | ORAL_TABLET | Freq: Four times a day (QID) | ORAL | Status: DC | PRN
  Administered 2023-11-17 – 2023-11-26 (×4): 1 via ORAL
  Filled 2023-11-15 (×4): qty 1

## 2023-11-15 MED ORDER — LEVETIRACETAM (KEPPRA) 500 MG/5 ML ADULT IV PUSH
500.0000 mg | Freq: Once | INTRAVENOUS | Status: AC
  Administered 2023-11-15: 500 mg via INTRAVENOUS
  Filled 2023-11-15: qty 5

## 2023-11-15 MED ORDER — LATANOPROST 0.005 % OP SOLN
1.0000 [drp] | Freq: Every day | OPHTHALMIC | Status: DC
  Administered 2023-11-15 – 2023-11-25 (×11): 1 [drp] via OPHTHALMIC
  Filled 2023-11-15 (×2): qty 2.5

## 2023-11-15 MED ORDER — POLYETHYLENE GLYCOL 3350 17 G PO PACK
17.0000 g | PACK | Freq: Every day | ORAL | Status: DC | PRN
Start: 2023-11-15 — End: 2023-11-26
  Administered 2023-11-20 – 2023-11-26 (×3): 17 g via ORAL
  Filled 2023-11-15 (×4): qty 1

## 2023-11-15 MED ORDER — ACETAMINOPHEN 650 MG RE SUPP: 650.0000 mg | Freq: Four times a day (QID) | RECTAL | Status: DC | PRN

## 2023-11-15 MED ORDER — INSULIN ASPART 100 UNIT/ML IJ SOLN
0.0000 [IU] | Freq: Three times a day (TID) | INTRAMUSCULAR | Status: DC
  Administered 2023-11-15: 5 [IU] via SUBCUTANEOUS
  Administered 2023-11-16: 7 [IU] via SUBCUTANEOUS
  Filled 2023-11-15 (×2): qty 1

## 2023-11-15 MED ORDER — MIDODRINE HCL 5 MG PO TABS: 5.0000 mg | ORAL_TABLET | Freq: Three times a day (TID) | ORAL | Status: DC

## 2023-11-15 MED ORDER — LEVETIRACETAM 500 MG PO TABS
500.0000 mg | ORAL_TABLET | Freq: Two times a day (BID) | ORAL | Status: DC
  Administered 2023-11-15 – 2023-11-26 (×22): 500 mg via ORAL
  Filled 2023-11-15 (×22): qty 1

## 2023-11-15 MED ORDER — ORAL CARE MOUTH RINSE
15.0000 mL | OROMUCOSAL | Status: DC | PRN
Start: 2023-11-15 — End: 2023-11-26

## 2023-11-15 MED ORDER — ACETAMINOPHEN 325 MG PO TABS: 650.0000 mg | ORAL_TABLET | Freq: Four times a day (QID) | ORAL | Status: DC | PRN

## 2023-11-15 MED ORDER — SODIUM CHLORIDE 0.9 % IV BOLUS
500.0000 mL | Freq: Once | INTRAVENOUS | Status: AC
  Administered 2023-11-15: 500 mL via INTRAVENOUS

## 2023-11-15 MED ORDER — ATORVASTATIN CALCIUM 20 MG PO TABS
80.0000 mg | ORAL_TABLET | Freq: Every day | ORAL | Status: DC
  Administered 2023-11-15 – 2023-11-25 (×11): 80 mg via ORAL
  Filled 2023-11-15 (×8): qty 4
  Filled 2023-11-15: qty 1
  Filled 2023-11-15 (×2): qty 4

## 2023-11-15 MED ORDER — STROKE: EARLY STAGES OF RECOVERY BOOK: Freq: Once | Status: AC

## 2023-11-15 MED ORDER — CHLORHEXIDINE GLUCONATE CLOTH 2 % EX PADS
6.0000 | MEDICATED_PAD | Freq: Every day | CUTANEOUS | Status: DC
  Administered 2023-11-15 – 2023-11-16 (×2): 6 via TOPICAL
  Filled 2023-11-15: qty 6

## 2023-11-15 MED ORDER — INSULIN ASPART 100 UNIT/ML IJ SOLN: 0.0000 [IU] | Freq: Every day | INTRAMUSCULAR | Status: DC

## 2023-11-15 NOTE — Consult Note (Signed)
 Consult requested by:  Dr. Ernest  Consult requested for:  Subdural hematoma  Primary Physician:  Alla Amis, MD  History of Present Illness: 11/15/2023 [name] is here today with a chief complaint of being found down.    He has had multiple falls.  His family reports that he is following somewhere between daily and weekly.  He was down for 5 to 10 minutes and was unresponsive.  He is in the emergency department currently and is not at his baseline.  He reports a headache a 4 out of 10.  He denies nausea or vomiting.   Review of Systems:  Unobtainable Past Medical History: Past Medical History:  Diagnosis Date   Acute metabolic encephalopathy 12/02/2020   DKA (diabetic ketoacidoses) 04/06/2016   Hypercholesteremia    Hypertension     Past Surgical History: Past Surgical History:  Procedure Laterality Date   AMPUTATION TOE Left 04/25/2022   Procedure: LEFT GREAT TOE AMPUTATION AND LEFT PARTIAL 2ND TOE AMPUTATION;  Surgeon: Janit Thresa HERO, DPM;  Location: [facility] ORS;  Service: Podiatry;  Laterality: Left;   COLONOSCOPY WITH PROPOFOL  N/A 02/01/2020   Procedure: COLONOSCOPY WITH PROPOFOL ;  Surgeon: Unk Corinn Skiff, MD;  Location: [facility] ENDOSCOPY;  Service: Gastroenterology;  Laterality: N/A;   ESOPHAGOGASTRODUODENOSCOPY  02/01/2020   Procedure: ESOPHAGOGASTRODUODENOSCOPY (EGD);  Surgeon: Unk Corinn Skiff, MD;  Location: [facility] ENDOSCOPY;  Service: Gastroenterology;;   ESOPHAGOGASTRODUODENOSCOPY (EGD) WITH PROPOFOL  N/A 06/07/2022   Procedure: ESOPHAGOGASTRODUODENOSCOPY (EGD) WITH PROPOFOL ;  Surgeon: Saintclair Jasper, MD;  Location: [facility] ENDOSCOPY;  Service: Gastroenterology;  Laterality: N/A;   KNEE SURGERY Right    Torn meniscus   KNEE SURGERY Left     Allergies: Allergies as of 11/15/2023 - Review Complete 11/15/2023  Allergen Reaction Noted   Penicillins Other (See Comments) 10/16/2014    Medications: Current Meds  Medication Sig   acetaminophen   (TYLENOL ) 325 MG tablet Take 2 tablets (650 mg total) by mouth every 6 (six) hours as needed for mild pain (pain score 1-3) or fever.   acyclovir  (ZOVIRAX ) 400 MG tablet Take 400 mg by mouth in the morning and at bedtime.   apixaban  (ELIQUIS ) 5 MG TABS tablet Take 5 mg by mouth 2 (two) times daily.   atorvastatin  (LIPITOR ) 80 MG tablet Take 1 tablet (80 mg total) by mouth at bedtime. Hold while taking Paxlovid    fludrocortisone  (FLORINEF ) 0.1 MG tablet Take 1 tablet (0.1 mg total) by mouth daily as needed. Skip the dose if SBP >130 (Patient taking differently: Take 0.1 mg by mouth daily. Skip the dose if SBP >130)   gabapentin  (NEURONTIN ) 300 MG capsule Take 1 capsule (300 mg total) by mouth 2 (two) times daily.   insulin  lispro (HUMALOG) 100 UNIT/ML injection INJECT 150 UNITS EVERY 3 DAYS VIA INSULIN  PUMP AS DIRECTED   latanoprost  (XALATAN ) 0.005 % ophthalmic solution 1 drop at bedtime.   levETIRAcetam  (KEPPRA ) 500 MG tablet Take 1 tablet by mouth twice daily   midodrine  (PROAMATINE ) 5 MG tablet Take 1 tablet (5 mg total) by mouth 3 (three) times daily.   oxyCODONE -acetaminophen  (PERCOCET/ROXICET) 5-325 MG tablet Take 1 tablet by mouth every 6 (six) hours as needed for severe pain (pain score 7-10).   pomalidomide  (POMALYST ) 2 MG capsule Take 1 capsule (2 mg total) by mouth daily. Take for 21 days, the hold for 7 days. Repeat every 28 days.    Social History: Social History   Tobacco Use   Smoking status: Never   Smokeless tobacco:  Never  Vaping Use   Vaping status: Never Used  Substance Use Topics   Alcohol use: Not Currently    Alcohol/week: 0.0 - 1.0 standard drinks of alcohol    Comment: once every 2 months   Drug use: Yes    Types: Marijuana, Crack cocaine    Comment: last week    Family Medical History: Family History  Problem Relation Age of Onset   Heart attack Father    Hypertension Sister    Cancer Sister     Physical Examination: Vitals:   11/15/23 1701  11/15/23 2030  BP: 110/83 (!) 136/91  Pulse: 100 93  Resp: 16 18  Temp: 97.8 F (36.6 C)   SpO2: 100% 96%    General: Patient is in no apparent distress. Attention to examination is appropriate.  Neck:   Supple.  Full range of motion.  Respiratory: Patient is breathing without any difficulty.   NEUROLOGICAL:     Awake, alert, oriented to person but not place or time.  Speech is clear and fluent.  Cranial Nerves: Pupils equal round and reactive to light.  Facial tone is symmetric.  Facial sensation is symmetric. Shoulder shrug is symmetric. Tongue protrusion is midline.   Strength: MAEW 5/5 except left upper extremity where he has a fracture  Bilateral upper and lower extremity sensation is intact to light touch.    No evidence of dysmetria noted.  Gait is untested.     Medical Decision Making  Imaging: CT Head 08/27/2023  IMPRESSION: 1. Positive for small Bilateral Subdural Hematomas: - right side mixed density SDH is 4-5 mm thickness and not significantly changed from 0015 hours. - left SDH is low-density and smaller, 2-3 mm, more conspicuous since the prior exam. - small hyperdense right parafalcine SDH is stable, 3 mm.   2. No skull fracture identified. No significant intracranial mass effect at this time, trace rightward midline shift.     Electronically Signed   By: VEAR Hurst M.D.   On: 08/27/2023 06:19  CT head on November 15, 2023 IMPRESSION: 1. Similar focus of intraventricular hemorrhage within the body of the right lateral ventricle. 2. Additional smaller focus of hemorrhage within the body of the left lateral ventricle, slightly decreased in size. 3. Small focus of hemorrhage in the right occipital horn is new from prior, possibly redistribution of blood products.   Electronically signed by: Donnice Mania MD 11/15/2023 02:15 PM EDT RP Workstation: [id number]  I have personally reviewed the images and agree with the above  interpretation.  Assessment and Plan: [name] is a pleasant 73 y.o. male with intraventricular hemorrhage that could be the result of a right thalamic hemorrhage.  He is not at his baseline, but he is stable.  I would not recommend any surgical intervention.  I think he may be candidate for neurology evaluation as this could represent a hemorrhagic stroke.  I think he will likely require admission given that he has not met his neurologic baseline and appears to be falling at home at a very high rate.  He may ultimately require placement.     I have communicated my recommendations to the requesting physician and coordinated care to facilitate these recommendations.     Samual Beals K. Clois MD, [facility] Neurosurgery

## 2023-11-15 NOTE — ED Notes (Addendum)
 Unable to obtain blood or IV.  Patient very difficult stick. Phlebotomy notified.

## 2023-11-15 NOTE — ED Notes (Signed)
 CCMD called to put pt on monitor

## 2023-11-15 NOTE — ED Notes (Signed)
 Lab called to request lab draw. This RN and Vernell PEAK attempted to draw labs multiple times without success.

## 2023-11-15 NOTE — ED Notes (Signed)
 Needing IV, no access.

## 2023-11-15 NOTE — ED Notes (Signed)
 Pts IV infiltrated on R arm while NS was running. Pharmacy was notified and they stated nothing needed to be done.

## 2023-11-15 NOTE — ED Provider Notes (Signed)
 7:56 AM Assumed care for off going team.   Blood pressure (!) 155/96, pulse 81, temperature 98 F (36.7 C), resp. rate 14, weight 63 kg, SpO2 100%.  See their HPI for full report but in brief pending CT head  D/w rads patient has a small volume intraventricular hemorrhage that was not there 4 days ago.  Discussed with patient he is moving all extremities he denies any concerns at this time.  We discussed his CT imaging showing concerns for bleed.  Patient does report taking his Eliquis  dose this morning prior to this syncopal episode.  He reports that he lives by himself with his girlfriend.   Troponins are negative CMP shows reassuring glucose.  CBC shows stable hemoglobin  8:31 AM Dr Clois currently in surgery by did talk with one of his nurses who spoke with him and recommended holding on reversal at this time of eliquis   but he will review patient's images when he is out of the OR.  Given his syncopal issue with new blood IVH we will discussed with the hospitalist for admission.   Ernest Ronal BRAVO, MD 11/15/23 [phone]

## 2023-11-15 NOTE — ED Provider Notes (Signed)
 [facility] Provider Note    Event Date/Time   First MD Initiated Contact with Patient 11/15/23 [phone]     (approximate)   History   Loss of Consciousness  Patient BIB [facility] c/o syncope.  Patient's wife reports patient did not hit head and is not on thinners.  EMS reports patient c/o left arm pain.  Patient denies any pain.    159/93 160 CBG 74 HR   HPI Nicholas Weiss is a 73 y.o. male PMH T1DM, hyperlipidemia, prior CVA, chronic orthostatic hypotension, paroxysmal A-fib on Eliquis , seizures, mild cognitive impairment presents for evaluation after loss of consciousness episode - Patient is a limited historian, very confused - Collateral gathered from son who lives with patient.  Heard him fall, stepped out of his room and found patient laying face forward.  Was unconscious for about 5-10 minutes.  No convulsions.  Note he does have relatively frequent falls and loss of consciousness episodes at home.  No recent infectious symptoms.  Normally ambulates with walker.  He is taking all of his medications including Eliquis .  Notes he is very confused at baseline and was at his baseline mental state when he woke up.  Per chart review, patient was recently admitted 11/11/2023-11/13/2023 after presenting for fall.  Noted to be having recurrent falls, was admitted in June of this year for subdural hematoma after a fall, also recently fractured left upper extremity.  Found to have potential malfunction of his insulin  pump causing a nonketotic hyperosmolar state --appears his insulin  pump got inadvertently disconnected.     Physical Exam   Triage Vital Signs: ED Triage Vitals  Encounter Vitals Group     BP 11/15/23 0413 (!) 173/110     Girls Systolic BP Percentile --      Girls Diastolic BP Percentile --      Boys Systolic BP Percentile --      Boys Diastolic BP Percentile --      Pulse Rate 11/15/23 0413 78     Resp 11/15/23 0413 18     Temp 11/15/23 0413 98 F  (36.7 C)     Temp src --      SpO2 11/15/23 0413 99 %     Weight 11/15/23 0409 138 lb 14.2 oz (63 kg)     Height --      Head Circumference --      Peak Flow --      Pain Score 11/15/23 0409 0     Pain Loc --      Pain Education --      Exclude from Growth Chart --     Most recent vital signs: Vitals:   11/15/23 0413  BP: (!) 173/110  Pulse: 78  Resp: 18  Temp: 98 F (36.7 C)  SpO2: 99%     General: Awake, no distress.  HEENT: Normocephalic, atraumatic, no clear midline neck tenderness. CV:  Good peripheral perfusion. RRR, RP 2+ Resp:  Normal effort. CTAB Abd:  No distention. Nontender to deep palpation throughout Neuro:  Alert, Aox1-2, moving all extremities spontaneously, no facial asymmetry, no no focal motor deficit appreciated. Other:  Deformities or tenderness to palpation throughout bilateral upper and lower extremities.  Full range of motion of all joints.   ED Results / Procedures / Treatments   Labs (all labs ordered are listed, but only abnormal results are displayed) Labs Reviewed  COMPREHENSIVE METABOLIC PANEL WITH GFR  CBC  URINALYSIS, ROUTINE W REFLEX MICROSCOPIC  CBG  MONITORING, ED  TROPONIN I (HIGH SENSITIVITY)     EKG  Ecg = possible accelerated junctional rhythm versus sinus rhythm, rate 84, no gross ST elevation or depression, no significant repolarization normality, normal axis, normal intervals.  No clear evidence of ischemia on my interpretation.  Appears similar to prior on my review.   RADIOLOGY Chest x-ray and XR pelvis reviewed, no acute pathology on my interpretation, radiology reports pending.  CT head and CT C-spine imaging pending.    PROCEDURES:  Critical Care performed: No  Procedures   MEDICATIONS ORDERED IN ED: Medications  sodium chloride  0.9 % bolus 500 mL (has no administration in time range)     IMPRESSION / MDM / ASSESSMENT AND PLAN / ED COURSE  I reviewed the triage vital signs and the nursing  notes.                              DDX/MDM/AP: Differential diagnosis includes, but is not limited to, recurrent syncopal episode, apparently has had issues with orthostasis previously.  Consider transient arrhythmia, underlying electrolyte abnormality, anemia, underlying UTI.  Doubt ACS.  Do not suspect PE and is already anticoagulated patient.  With regard to possible traumatic injuries, doubt bony injuries at this time though consider possibility of intracranial hemorrhage or C-spine injury.  Plan: - Labs - EKG - Chest x-ray, XR pelvis - CT head, CT C-spine -Small bolus IV fluid - Reassess, low threshold for admission given prolonged syncopal episode  Patient's presentation is most consistent with acute presentation with potential threat to life or bodily function.  The patient is on the cardiac monitor to evaluate for evidence of arrhythmia and/or significant heart rate changes.  ED course below.  ED team unable to get IV access multiple attempts, IV team consulted.  Signed out to oncoming ED provider pending labs, imaging.  Clinical Course as of 11/15/23 9370  Mon Nov 15, 2023  0615 Spoke w/ pt's son [name] - was asleep when this happened - passed out, was laying stomach first on floor - +LOC, 5-10 mins, confirms he is taking his thinners - does fall frequently  - is ambulatory with walker at baseline   [MM]    Clinical Course User Index [MM] Clarine Ozell LABOR, MD     FINAL CLINICAL IMPRESSION(S) / ED DIAGNOSES   Final diagnoses:  Syncope and collapse     Rx / DC Orders   ED Discharge Orders     None        Note:  This document was prepared using Dragon voice recognition software and may include unintentional dictation errors.   Clarine Ozell LABOR, MD 11/15/23 [phone]

## 2023-11-15 NOTE — ED Triage Notes (Signed)
 Patient BIB [facility] c/o syncope.  Patient's wife reports patient did not hit head and is not on thinners.  EMS reports patient c/o left arm pain.  Patient denies any pain.    159/93 160 CBG 74 HR

## 2023-11-15 NOTE — H&P (Signed)
 History and Physical    Nicholas Weiss FMW:[id number] DOB: 1950-11-08 DOA: 11/15/2023  DOS: the patient was seen and examined on 11/15/2023  PCP: Alla Amis, MD   Patient coming from: Home  I have personally briefly reviewed patient's old medical records in [facility]  Chief Complaint: Fall at home  HPI: Nicholas Weiss is a pleasant 73 y.o. male with medical history significant for type I DM, HLD, recent history of fall with subdural hematoma in June 2025, chronic orthostatic hypotension, A-fib on Eliquis , seizure disorder, mild cognitive impairment, EtOH use who was brought in from home after he lost consciousness, fell on the ground.  Patient's wife reported that he did not hit his head.  Patient was confused and not able to provide meaningful history.  Patient's wife provided the history that she heard him fall, stepped out of the room and found him lying down with facedown forward.  Patient was unconscious about 5 to 10 minutes.  No convulsions.  She also stated that he has a frequent falls.  No history of recent infections, cough cold, fever, sick contact.  Patient normally ambulates with a walker at home.  He is confused at baseline at home. Patient was recently admitted to the hospital between 11/11/2023 to 11/13/2023 after presenting a fall.  As noted above he had a similar incident with the fall in June 2025 and had a subdural hematoma and a fractured left upper extremity.  He also had insulin  pump malfunction and presented with nonketotic hyperosmolar state in August 2025.  ED Course: Upon arrival to the ED, patient is found to have focus of intraventricular right lateral ventricle and occipital horn of right ventricle on CT scan of the brain.  Neurosurgical team was contacted and they will come and see the patient.  As per emergency room provider they advised that patient does not need transfer and we can keep him here.  Hospitalist service was consulted for evaluation for  admission for neurosurgical evaluation and monitoring.  Review of Systems:  ROS  All other systems negative except as noted in the HPI.  Past Medical History:  Diagnosis Date   Acute metabolic encephalopathy 12/02/2020   DKA (diabetic ketoacidoses) 04/06/2016   Hypercholesteremia    Hypertension     Past Surgical History:  Procedure Laterality Date   AMPUTATION TOE Left 04/25/2022   Procedure: LEFT GREAT TOE AMPUTATION AND LEFT PARTIAL 2ND TOE AMPUTATION;  Surgeon: Janit Thresa HERO, DPM;  Location: [facility] ORS;  Service: Podiatry;  Laterality: Left;   COLONOSCOPY WITH PROPOFOL  N/A 02/01/2020   Procedure: COLONOSCOPY WITH PROPOFOL ;  Surgeon: Unk Corinn Skiff, MD;  Location: [facility] ENDOSCOPY;  Service: Gastroenterology;  Laterality: N/A;   ESOPHAGOGASTRODUODENOSCOPY  02/01/2020   Procedure: ESOPHAGOGASTRODUODENOSCOPY (EGD);  Surgeon: Unk Corinn Skiff, MD;  Location: [facility] ENDOSCOPY;  Service: Gastroenterology;;   ESOPHAGOGASTRODUODENOSCOPY (EGD) WITH PROPOFOL  N/A 06/07/2022   Procedure: ESOPHAGOGASTRODUODENOSCOPY (EGD) WITH PROPOFOL ;  Surgeon: Saintclair Jasper, MD;  Location: [facility] ENDOSCOPY;  Service: Gastroenterology;  Laterality: N/A;   KNEE SURGERY Right    Torn meniscus   KNEE SURGERY Left      reports that he has never smoked. He has never used smokeless tobacco. He reports that he does not currently use alcohol. He reports current drug use. Drugs: Marijuana and Crack cocaine.  Allergies  Allergen Reactions   Penicillins Other (See Comments)    Childhood allergy -tolerated amoxil  03/2022 Unknown reaction Has patient had a PCN reaction causing immediate rash, facial/tongue/throat swelling, SOB or  lightheadedness with hypotension: No Has patient had a PCN reaction causing severe rash involving mucus membranes or skin necrosis: No Has patient had a PCN reaction that required hospitalization No Has patient had a PCN reaction occurring within the last 10 years: No If all of the above  answers are NO, then may proceed with Cephalosporin use.     Family History  Problem Relation Age of Onset   Heart attack Father    Hypertension Sister    Cancer Sister     Prior to Admission medications   Medication Sig Start Date End Date Taking? Authorizing Provider  acetaminophen  (TYLENOL ) 325 MG tablet Take 2 tablets (650 mg total) by mouth every 6 (six) hours as needed for mild pain (pain score 1-3) or fever. 09/17/23   Josette Ade, MD  apixaban  (ELIQUIS ) 5 MG TABS tablet Take 5 mg by mouth 2 (two) times daily. 10/20/23   [provider]  atorvastatin  (LIPITOR ) 80 MG tablet Take 1 tablet (80 mg total) by mouth at bedtime. Hold while taking Paxlovid  04/08/22   Caleen Qualia, MD  Continuous Glucose Sensor (DEXCOM G7 SENSOR) MISC 1 each by Does not apply route 4 (four) times daily -  before meals and at bedtime. 04/10/23   Jhonny Calvin NOVAK, MD  fludrocortisone  (FLORINEF ) 0.1 MG tablet Take 1 tablet (0.1 mg total) by mouth daily as needed. Skip the dose if SBP >130 09/17/23   Josette Ade, MD  gabapentin  (NEURONTIN ) 300 MG capsule Take 1 capsule (300 mg total) by mouth 2 (two) times daily. 09/17/23   Josette Ade, MD  insulin  lispro (HUMALOG) 100 UNIT/ML injection INJECT 150 UNITS EVERY 3 DAYS VIA INSULIN  PUMP AS DIRECTED 10/26/23   [provider]  iron  polysaccharides (NIFEREX) 150 MG capsule Take 1 capsule (150 mg total) by mouth daily. Patient not taking: Reported on 11/11/2023 03/20/23 11/10/23  Von Bellis, MD  latanoprost  (XALATAN ) 0.005 % ophthalmic solution 1 drop at bedtime. 11/02/23   [provider]  levETIRAcetam  (KEPPRA ) 500 MG tablet Take 1 tablet by mouth twice daily 09/09/23   Vaslow, [name] K, MD  midodrine  (PROAMATINE ) 5 MG tablet Take 1 tablet (5 mg total) by mouth 3 (three) times daily. 11/13/23   Singh, Prashant K, MD  pomalidomide  (POMALYST ) 2 MG capsule Take 1 capsule (2 mg total) by mouth daily. Take for 21 days, the hold for 7  days. Repeat every 28 days. 10/19/23   Melanee Annah BROCKS, MD    Physical Exam: Vitals:   11/15/23 1219 11/15/23 1230 11/15/23 1300 11/15/23 1414  BP:  138/84 126/70 136/84  Pulse:  99 95 (!) 103  Resp:    12  Temp: 98 F (36.7 C)     TempSrc: Oral     SpO2:  100% 98% 100%  Weight:        Physical Exam   Constitutional: Alert, awake, calm, comfortable HEENT: Neck supple Respiratory: Clear to auscultation B/L, no wheezing, no rales.  Cardiovascular: Regular rate and rhythm, no murmurs / rubs / gallops. No extremity edema. 2+ pedal pulses. No carotid bruits.  Abdomen: Soft, no tenderness, Bowel sounds positive.  Musculoskeletal: no clubbing / cyanosis. Good ROM, no contractures. Normal muscle tone.  Skin: no rashes, lesions, ulcers. Neurologic: CN 2-12 grossly intact. Sensation intact, No focal deficit identified Psychiatric: Alert and awake and confused x 3. Normal mood.    Labs on Admission: I have personally reviewed following labs and imaging studies  CBC: Recent Labs  Lab 11/10/23 [phone]  11/11/23 1523 11/15/23 0619  WBC 3.2* 4.1 4.7  NEUTROABS 1.6* 2.7  --   HGB 8.4* 9.7* 9.2*  HCT 25.7* 30.5* 28.3*  MCV 97.7 100.0 97.6  PLT 262 194 237   Basic Metabolic Panel: Recent Labs  Lab 11/11/23 1523 11/11/23 2115 11/13/23 0044 11/13/23 0622 11/15/23 0619  NA 129* 136 133* 135 136  K 5.2* 3.9 3.8 3.8 4.0  CL 96* 103 101 104 101  CO2 21* 25 25 25 26   GLUCOSE 712* 232* 199* 101* 129*  BUN 43* 36* 27* 26* 25*  CREATININE 1.96* 1.57* 1.36* 1.26* 1.32*  CALCIUM  9.1 8.9 8.4* 8.3* 8.2*  MG  --   --  1.6* 1.5*  --   PHOS  --   --   --  2.8  --    GFR: Estimated Creatinine Clearance: 46.4 mL/min (A) (by C-G formula based on SCr of 1.32 mg/dL (H)). Liver Function Tests: Recent Labs  Lab 11/10/23 0832 11/11/23 1523 11/15/23 0619  AST 24 26 37  ALT 28 29 37  ALKPHOS 72 72 72  BILITOT 0.7 1.0 0.7  PROT 9.0* 8.5* 8.2*  ALBUMIN 3.2* 3.1* 2.9*   No results for  input(s): LIPASE, AMYLASE in the last 168 hours. No results for input(s): AMMONIA  in the last 168 hours. Coagulation Profile: No results for input(s): INR, PROTIME in the last 168 hours. Cardiac Enzymes: Recent Labs  Lab 11/15/23 0619  TROPONINIHS 13   BNP (last 3 results) No results for input(s): BNP in the last 8760 hours. HbA1C: No results for input(s): HGBA1C in the last 72 hours. CBG: Recent Labs  Lab 11/12/23 1520 11/12/23 2034 11/13/23 0013 11/13/23 0830 11/13/23 1158  GLUCAP 124* 320* 200* 77 114*   Lipid Profile: No results for input(s): CHOL, HDL, LDLCALC, TRIG, CHOLHDL, LDLDIRECT in the last 72 hours. Thyroid  Function Tests: No results for input(s): TSH, T4TOTAL, FREET4, T3FREE, THYROIDAB in the last 72 hours. Anemia Panel: No results for input(s): VITAMINB12, FOLATE, FERRITIN, TIBC, IRON , RETICCTPCT in the last 72 hours. Urine analysis:    Component Value Date/Time   COLORURINE STRAW (A) 11/11/2023 1523   APPEARANCEUR CLEAR 11/11/2023 1523   LABSPEC 1.018 11/11/2023 1523   PHURINE 5.0 11/11/2023 1523   GLUCOSEU >=500 (A) 11/11/2023 1523   HGBUR NEGATIVE 11/11/2023 1523   BILIRUBINUR NEGATIVE 11/11/2023 1523   KETONESUR 5 (A) 11/11/2023 1523   PROTEINUR NEGATIVE 11/11/2023 1523   NITRITE NEGATIVE 11/11/2023 1523   LEUKOCYTESUR NEGATIVE 11/11/2023 1523    Radiological Exams on Admission: I have personally reviewed images CT HEAD WO CONTRAST ( ) Result Date: 11/15/2023 EXAM: CT HEAD WITHOUT CONTRAST 11/15/2023 01:39:27 PM TECHNIQUE: CT of the head was performed without the administration of intravenous contrast. Automated exposure control, iterative reconstruction, and/or weight based adjustment of the mA/kV was utilized to reduce the radiation dose to as low as reasonably achievable. COMPARISON: CT head earlier same day. CLINICAL HISTORY: Head trauma, intracranial arterial injury suspected; repeat 6 hour.  FINDINGS: BRAIN AND VENTRICLES: There is a similar focus of intraventricular hemorrhage within the body of the right lateral ventricle which is similar to prior. Additional smaller focus of hemorrhage within the body of the left lateral ventricle is slightly decreased in size. There is a small focus of hemorrhage extending into the right occipital horn likely reflecting a redistribution of blood products. The hemorrhage in the left occipital horn seen on prior CT is not well visualized. There are no areas of parenchymal hemorrhage visualized. No midline  shift. Similar dural calcifications. Mild probable volume loss. ORBITS: No acute abnormality. SINUSES: No acute abnormality. SOFT TISSUES AND SKULL: No acute soft tissue abnormality. No skull fracture. IMPRESSION: 1. Similar focus of intraventricular hemorrhage within the body of the right lateral ventricle. 2. Additional smaller focus of hemorrhage within the body of the left lateral ventricle, slightly decreased in size. 3. Small focus of hemorrhage in the right occipital horn is new from prior, possibly redistribution of blood products. Electronically signed by: Donnice Mania MD 11/15/2023 02:15 PM EDT RP Workstation: [id number]   CT HEAD WO CONTRAST ( ) Addendum Date: 11/15/2023 ADDENDUM REPORT: 11/15/2023 08:00 ADDENDUM: Study discussed by telephone with Dr. Ernest in the ED on 11/15/2023 at 0755 hours. Electronically Signed   By: VEAR Hurst M.D.   On: 11/15/2023 08:00   Result Date: 11/15/2023 CLINICAL DATA:  73 year old male with syncope, fall with pain. History of multiple myeloma. EXAM: CT HEAD WITHOUT CONTRAST TECHNIQUE: Contiguous axial images were obtained from the base of the skull through the vertex without intravenous contrast. RADIATION DOSE REDUCTION: This exam was performed according to the departmental dose-optimization program which includes automated exposure control, adjustment of the mA and/or kV according to patient size and/or use of  iterative reconstruction technique. COMPARISON:  Recent noncontrast head CT 11/11/2023. Brain MRI 10/18/2022. FINDINGS: Brain: Small volume right greater than left lateral intraventricular hemorrhage (series 3, image 17, coronal image 34) is new. No definite transependymal extension of blood. No ventriculomegaly. Trace layering occipital horn blood on the left is also new series 3, image 14. No other acute intracranial hemorrhage identified. No intracranial mass effect or midline shift. Stable gray-white matter differentiation throughout the brain. No cortically based acute infarct identified. Incidental bulky chronic dural calcifications. Vascular: Calcified atherosclerosis at the skull base. No suspicious intracranial vascular hyperdensity. Skull: Appears stable and intact. No destructive osseous lesion identified. Sinuses/Orbits: Visualized paranasal sinuses and mastoids are stable and well aerated. Other: No acute orbit or scalp soft tissue injury identified. ---------------------------------------------------- Traumatic Brain Injury Risk Stratification Skull Fracture: No - Low/mBIG 1 Subdural Hematoma (SDH): No - Low Subarachnoid Hemorrhage [facility]): No Epidural Hematoma (EDH): No - Low/mBIG 1 Cerebral contusion, intra-axial, intraparenchymal Hemorrhage (IPH): No Intraventricular Hemorrhage (IVH): Yes - High/mBIG 3 Midline Shift > 1mm or Edema/effacement of sulci/vents: No - Low/mBIG 1 ---------------------------------------------------- IMPRESSION: 1. Positive for small volume acute intraventricular hemorrhage, greater on the right and likely posttraumatic. No associated ventriculomegaly. 2. No other acute intracranial abnormality or acute traumatic injury identified. Electronically Signed: By: VEAR Hurst M.D. On: 11/15/2023 07:48   CT Cervical Spine Wo Contrast Result Date: 11/15/2023 CLINICAL DATA:  Syncope with fall.  Left arm pain. EXAM: CT CERVICAL SPINE WITHOUT CONTRAST TECHNIQUE: Multidetector CT imaging  of the cervical spine was performed without intravenous contrast. Multiplanar CT image reconstructions were also generated. RADIATION DOSE REDUCTION: This exam was performed according to the departmental dose-optimization program which includes automated exposure control, adjustment of the mA and/or kV according to patient size and/or use of iterative reconstruction technique. COMPARISON:  08/27/2023 FINDINGS: Alignment: Trace degenerative retrolisthesis of C5 on 6 and C6 on 7 with corresponding loss of disc height at both levels. No evidence for traumatic subluxation. Skull base and vertebrae: No acute fracture. No primary bone lesion or focal pathologic process. Soft tissues and spinal canal: No prevertebral fluid or swelling. No visible canal hematoma. Disc levels: Loss of disc height noted C5-6 and C6-7. Facets are well aligned bilaterally with some degenerative left-sided facet disease in the  mid cervical levels. Upper chest: Unremarkable. Other: None. IMPRESSION: Degenerative changes in the cervical spine without evidence for an acute fracture or traumatic subluxation. Electronically Signed   By: Camellia Candle M.D.   On: 11/15/2023 07:43   DG Chest Portable 1 View Result Date: 11/15/2023 CLINICAL DATA:  73 year old male with syncope, fall with pain. History of multiple myeloma. EXAM: PORTABLE CHEST 1 VIEW COMPARISON:  Portable chest 11/11/2023. FINDINGS: Portable AP semi upright view at 0621 hours. Partially visible fracture of the proximal left humerus shaft, medial displacement, this area not included on recent comparisons. Normal lung volumes and mediastinal contours. Visualized tracheal air column is within normal limits. Allowing for portable technique the lungs are clear. Negative visible bowel gas pattern. No other No acute osseous abnormality identified. IMPRESSION: 1. Partially visible displaced proximal left humerus fracture. 2. No acute cardiopulmonary abnormality. Electronically Signed   By: VEAR Hurst M.D.   On: 11/15/2023 06:45   DG Pelvis Portable Result Date: 11/15/2023 CLINICAL DATA:  73 year old male with syncope, fall with pain. History of multiple myeloma. EXAM: PORTABLE PELVIS 1-2 VIEWS COMPARISON:  Abdominal radiographs 08/30/2023 and earlier. FINDINGS: Portable AP supine view at 0620 hours. Pelvis bone mineralization appears stable and within normal limits. Femoral heads appear located. No pelvis fracture identified. Grossly intact proximal femurs. Calcified iliofemoral atherosclerosis. Nonobstructed bowel-gas pattern. IMPRESSION: No acute fracture or dislocation identified about the pelvis. Electronically Signed   By: VEAR Hurst M.D.   On: 11/15/2023 06:43    EKG: My personal interpretation of EKG shows: Accelerated junctional rhythm which is similar to EKG he had on 11/11/2023    Assessment/Plan Principal Problem:   Bleeding in brain [facility]) Active Problems:   Syncope and collapse, recurrent   Hypertension   Fall   Uncontrolled type 1 diabetes mellitus with hyperglycemia, with long-term current use of insulin  (HCC)   Seizure disorder (HCC)   MCI (mild cognitive impairment) with memory loss    Assessment and Plan: 73 year old male W/PMH of A-fib on Eliquis , DM on insulin  pump, orthostatic hypotension, paroxysmal atrial fibrillation on Eliquis , HLD, frequent falls, subdural hematoma, seizure disorder who was brought in to ED at [facility] for a fall at home.  1.  Syncope/collapse/fall with intraventricular hemorrhage - Patient has a history of recurrent falls and traumatic subdural hematoma in the past. - He has intraventricular bleeding and neurosurgery team was contacted from ED - Will place him in observation in the stepdown unit for monitoring - Waiting on neurosurgical team to give recommendation. - Per family patient is at baseline he has confusion at baseline - Fall precaution - Seizure precaution - Telemetry monitoring  2.  Seizure disorder - He is on Keppra  continue  with that - Seizure precaution - Fall precautions  3.  Atrial fibrillation - He is not a candidate for anticoagulation at this point due to recurrent fall and bleeding brain - I will discontinue Eliquis   4.  HLD/HTN - Stable - Resume home medications - He has a midodrine  on board looks like he has hypotension - Continue midodrine  at this point  5.  Type 1 diabetes - Will place him on insulin  sliding scale     DVT prophylaxis: SCDs Code Status: Full Code Family Communication: Not available Disposition Plan: Home versus rehab Consults called: Neurosurgery Admission status: Observation, Step Down Unit   Nena Rebel, MD [facility] 11/15/2023, 2:30 PM

## 2023-11-15 NOTE — ED Notes (Signed)
 Seizure pads applied to bed and fall precautions in place

## 2023-11-15 NOTE — Evaluation (Signed)
 Speech Language Pathology Evaluation Patient Details Name: Nicholas Weiss MRN: [id number] DOB: [birth year] Today's Date: 11/15/2023 Time: 1230-1240 SLP Time Calculation (min) (ACUTE ONLY): 10 min  Problem List:  Patient Active Problem List   Diagnosis Date Noted   Bleeding in brain (HCC) 11/15/2023   Drop in hemoglobin 09/16/2023   Left arm pain 09/15/2023   Fall 09/15/2023   Polysubstance abuse (HCC) 09/15/2023   Myocardial injury 09/15/2023   Subdural bleeding (HCC) 08/27/2023   SDH (subdural hematoma) (HCC) 08/27/2023   MCI (mild cognitive impairment) with memory loss 08/18/2023   DKA (diabetic ketoacidosis) (HCC) 08/15/2023   High anion gap metabolic acidosis 08/15/2023   Prolonged QT interval 07/22/2023   Uncontrolled type 1 diabetes mellitus with hypoglycemia, with long-term current use of insulin  (HCC) 04/05/2023   Hyperosmolar hyperglycemic state (HHS) (HCC) 03/26/2023   Iron  deficiency anemia 03/26/2023   Seizure disorder (HCC) 03/26/2023   Systemic inflammatory response syndrome (SIRS) associated with organ dysfunction (HCC) 03/26/2023   NSTEMI (non-ST elevated myocardial infarction) (HCC) 03/09/2023   Elevated troponin 03/08/2023   Paroxysmal atrial fibrillation (HCC) 03/08/2023   Chronic anticoagulation 03/08/2023   ICH (intracerebral hemorrhage) (HCC) 02/16/2023   Uncontrolled diabetes mellitus with hyperglycemia (HCC) 02/16/2023   GERD without esophagitis 02/16/2023   Hypertension 02/16/2023   Diabetic neuropathy (HCC) 02/16/2023   Intraventricular hemorrhage (HCC) 02/16/2023   Myoclonus 01/15/2023   Hypothermia 12/29/2022   Dizziness 11/13/2022   Substance abuse (HCC) 09/18/2022   History of GI bleed 09/18/2022   History of intraventricular hemorrhage following syncopal event 02/15/2023 09/18/2022   Uncontrolled type 1 diabetes mellitus with hyperglycemia, with long-term current use of insulin  (HCC) 08/26/2022   Postural dizziness with presyncope 08/18/2022    Near syncope 08/18/2022   Closed fracture of shaft of left humerus 06/18/2022   Malnutrition of moderate degree 06/10/2022   Orthostatic hypotension 06/10/2022   GI bleed 06/06/2022   Subdural hematoma (HCC) 05/15/2022   Falls 05/15/2022   Shoulder pain, left 05/15/2022   History of amputation of left great toe (HCC) 05/04/2022   Hypophosphatemia 04/28/2022   Protein-calorie malnutrition, severe 04/24/2022   Diabetic ulcer of toe associated with diabetes mellitus due to underlying condition, with fat layer exposed (HCC) 04/24/2022   Streptococcal bacteremia 04/24/2022   Leukopenia 04/24/2022   History of multiple myeloma 04/24/2022   Dysautonomia orthostatic hypotension with recurrent syncope 04/24/2022   Weight loss 04/23/2022   Osteomyelitis of left foot (HCC) 04/22/2022   DKA, type 1 (HCC) 04/22/2022   Dehydration 04/08/2022   Diabetic ketoacidosis without coma associated with type 1 diabetes mellitus (HCC) 04/07/2022   Hyperkalemia 04/07/2022   Traumatic pneumothorax 02/18/2022   Multiple fractures of ribs, left side, initial encounter for closed fracture 02/18/2022   Pneumothorax, closed, traumatic 02/18/2022   Benign prostatic hyperplasia with nocturia 02/12/2022   Stroke (cerebrum) (HCC) 11/07/2021   Multiple myeloma (HCC) 09/29/2021   Multiple myeloma not having achieved remission (HCC) 09/29/2021   Hyponatremia 08/18/2021   COVID-19 virus infection 08/13/2021   History of CVA (cerebrovascular accident) 08/13/2021   Syncope and collapse, recurrent 08/09/2021   Hepatic lesion 08/05/2021   Dyslipidemia 05/08/2021   History of substance abuse (HCC) 12/09/2020   Acute metabolic encephalopathy 12/02/2020   Cocaine abuse (HCC) 11/30/2020   Muscle twitching 09/12/2020   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   Pre-syncope 08/01/2020   Heart block AV first degree 08/01/2020   Anemia due to chronic kidney disease 05/28/2020   PUD (peptic ulcer disease)    Esophageal  dysphagia    Encounter for screening colonoscopy    Hyperglycemia 07/28/2019   Generalized weakness 07/28/2019   Hypoglycemia 04/27/2019   Unresponsiveness 04/27/2019   Lung nodule 04/07/2019   Acute renal failure superimposed on stage 3a chronic kidney disease (HCC)    Hypertensive urgency 04/05/2019   AKI (acute kidney injury) (HCC) 04/05/2019   CKD (chronic kidney disease) stage 3, GFR 30-59 ml/min (HCC) 04/05/2019   Hyperlipidemia 01/24/2016   Type 1 diabetes mellitus with diabetic neuropathy, unspecified (HCC) 05/31/2006   Past Medical History:  Past Medical History:  Diagnosis Date   Acute metabolic encephalopathy 12/02/2020   DKA (diabetic ketoacidoses) 04/06/2016   Hypercholesteremia    Hypertension    Past Surgical History:  Past Surgical History:  Procedure Laterality Date   AMPUTATION TOE Left 04/25/2022   Procedure: LEFT GREAT TOE AMPUTATION AND LEFT PARTIAL 2ND TOE AMPUTATION;  Surgeon: Janit Thresa HERO, DPM;  Location: [facility] ORS;  Service: Podiatry;  Laterality: Left;   COLONOSCOPY WITH PROPOFOL  N/A 02/01/2020   Procedure: COLONOSCOPY WITH PROPOFOL ;  Surgeon: Unk Corinn Skiff, MD;  Location: [facility] ENDOSCOPY;  Service: Gastroenterology;  Laterality: N/A;   ESOPHAGOGASTRODUODENOSCOPY  02/01/2020   Procedure: ESOPHAGOGASTRODUODENOSCOPY (EGD);  Surgeon: Unk Corinn Skiff, MD;  Location: [facility] END[facility]COPY;  Service: Gastroenterology;;   ESOPHAGOGASTRODUODENOSCOPY (EGD) WITH PROPOFOL  N/A 06/07/2022   Procedure: ESOPHAGOGASTRODUODENOSCOPY (EGD) WITH PROPOFOL ;  Surgeon: Saintclair Jasper, MD;  Location: [facility] ENDOSCOPY;  Service: Gastroenterology;  Laterality: N/A;   KNEE SURGERY Right    Torn meniscus   KNEE SURGERY Left    HPI:  Pt is a 70 male who presented to ED today following syncopal episode. Pt recently admitted to hospital s/p fall. Pt with hx of recurrent falls, SDH, LUE fx, hyperosmolar nonketotic status, DMI with poor glycemic control, acute on chronic CKD, orthostatic  hypotension, a-fb, sz, mult myeloma, HLC, MCI. Per chart review, cognitive deficits have been ongoing and confirmed with family.  Has follow-up with neurologist as per the family in the next few weeks for further workup. (Per D/C Summary on 11/13/23). Head CT, 11/15/23, 1. Positive for small volume acute intraventricular hemorrhage,  greater on the right and likely posttraumatic. No associated  ventriculomegaly.  2. No other acute intracranial abnormality or acute traumatic injury  identified.   Assessment / Plan / Recommendation Clinical Impression  Pt seen for cognitive-linguistic evaluation. Assessment completed via informal means given severity of cognitive-linguistic impairment. Pt presents with cognitive-linguistic deficits affecting attention, memory, problem solving, reasoning/insight. Per recent Discharge Summary (11/13/23), cognitive deficits have been ongoing and confirmed with family.  Has follow-up with neurologist as per the family in the next few weeks for further workup. Given the above recommend OP Neurology/Neuropsychological work up as well as post-acute SLP services in home/functional environment.    SLP Assessment  SLP Recommendation/Assessment: All further Speech Language Pathology needs can be addressed in the next venue of care SLP Visit Diagnosis: Cognitive communication deficit (R41.841)     Assistance Recommended at Discharge  Frequent or constant Supervision/Assistance  Functional Status Assessment Patient has had a recent decline in their functional status and demonstrates the ability to make significant improvements in function in a reasonable and predictable amount of time.        SLP Evaluation Cognition  Overall Cognitive Status: No family/caregiver present to determine baseline cognitive functioning Orientation Level: Oriented to person;Disoriented to place;Disoriented to time;Disoriented to situation Memory: Impaired Memory Impairment: Storage  deficit;Retrieval deficit;Decreased recall of new information;Decreased short term memory;Prospective memory Awareness: Impaired Problem  Solving: Impaired Problem Solving Impairment: Verbal basic Behaviors: Restless;Impulsive;Confabulation Safety/Judgment: Impaired       Comprehension  Auditory Comprehension Overall Auditory Comprehension: Impaired Yes/No Questions: Impaired (inconsistent) Commands: Impaired One Step Basic Commands: 75-100% accurate    Expression Expression Primary Mode of Expression: Verbal Verbal Expression Overall Verbal Expression:  (no overt s/sx anomia)   Oral / Motor  Oral Motor/Sensory Function Overall Oral Motor/Sensory Function: Within functional limits Motor Speech Overall Motor Speech: Appears within functional limits for tasks assessed           Delon Bangs, M.S., CCC-SLP Speech-Language Pathologist [facility] [phone] (ASCOM)  Delon CHRISTELLA Bangs 11/15/2023, 1:10 PM

## 2023-11-16 ENCOUNTER — Observation Stay

## 2023-11-16 ENCOUNTER — Observation Stay (HOSPITAL_BASED_OUTPATIENT_CLINIC_OR_DEPARTMENT_OTHER)
Admit: 2023-11-16 | Discharge: 2023-11-16 | Disposition: A | Discharge status: Home / Self Care | Attending: Hospitalist | Admitting: Hospitalist

## 2023-11-16 ENCOUNTER — Inpatient Hospital Stay

## 2023-11-16 DIAGNOSIS — I482 Chronic atrial fibrillation, unspecified: Secondary | ICD-10-CM | POA: Insufficient documentation

## 2023-11-16 DIAGNOSIS — Z7901 Long term (current) use of anticoagulants: Secondary | ICD-10-CM | POA: Diagnosis not present

## 2023-11-16 DIAGNOSIS — G40909 Epilepsy, unspecified, not intractable, without status epilepticus: Secondary | ICD-10-CM | POA: Diagnosis present

## 2023-11-16 DIAGNOSIS — D638 Anemia in other chronic diseases classified elsewhere: Secondary | ICD-10-CM

## 2023-11-16 DIAGNOSIS — E871 Hypo-osmolality and hyponatremia: Secondary | ICD-10-CM | POA: Diagnosis present

## 2023-11-16 DIAGNOSIS — I252 Old myocardial infarction: Secondary | ICD-10-CM | POA: Diagnosis not present

## 2023-11-16 DIAGNOSIS — D689 Coagulation defect, unspecified: Secondary | ICD-10-CM | POA: Diagnosis not present

## 2023-11-16 DIAGNOSIS — S42412S Displaced simple supracondylar fracture without intercondylar fracture of left humerus, sequela: Secondary | ICD-10-CM

## 2023-11-16 DIAGNOSIS — W1830XA Fall on same level, unspecified, initial encounter: Secondary | ICD-10-CM | POA: Diagnosis present

## 2023-11-16 DIAGNOSIS — N182 Chronic kidney disease, stage 2 (mild): Secondary | ICD-10-CM | POA: Diagnosis present

## 2023-11-16 DIAGNOSIS — D631 Anemia in chronic kidney disease: Secondary | ICD-10-CM | POA: Diagnosis present

## 2023-11-16 DIAGNOSIS — R29703 NIHSS score 3: Secondary | ICD-10-CM | POA: Diagnosis not present

## 2023-11-16 DIAGNOSIS — D68318 Other hemorrhagic disorder due to intrinsic circulating anticoagulants, antibodies, or inhibitors: Secondary | ICD-10-CM | POA: Diagnosis present

## 2023-11-16 DIAGNOSIS — E440 Moderate protein-calorie malnutrition: Secondary | ICD-10-CM | POA: Diagnosis present

## 2023-11-16 DIAGNOSIS — I951 Orthostatic hypotension: Secondary | ICD-10-CM | POA: Diagnosis present

## 2023-11-16 DIAGNOSIS — E10649 Type 1 diabetes mellitus with hypoglycemia without coma: Secondary | ICD-10-CM | POA: Diagnosis not present

## 2023-11-16 DIAGNOSIS — Z7189 Other specified counseling: Secondary | ICD-10-CM | POA: Diagnosis not present

## 2023-11-16 DIAGNOSIS — I615 Nontraumatic intracerebral hemorrhage, intraventricular: Secondary | ICD-10-CM | POA: Diagnosis not present

## 2023-11-16 DIAGNOSIS — E1040 Type 1 diabetes mellitus with diabetic neuropathy, unspecified: Secondary | ICD-10-CM | POA: Diagnosis present

## 2023-11-16 DIAGNOSIS — E1065 Type 1 diabetes mellitus with hyperglycemia: Secondary | ICD-10-CM | POA: Diagnosis present

## 2023-11-16 DIAGNOSIS — Y92019 Unspecified place in single-family (private) house as the place of occurrence of the external cause: Secondary | ICD-10-CM | POA: Diagnosis not present

## 2023-11-16 DIAGNOSIS — R55 Syncope and collapse: Secondary | ICD-10-CM

## 2023-11-16 DIAGNOSIS — Z8616 Personal history of COVID-19: Secondary | ICD-10-CM | POA: Diagnosis not present

## 2023-11-16 DIAGNOSIS — E7800 Pure hypercholesterolemia, unspecified: Secondary | ICD-10-CM | POA: Diagnosis present

## 2023-11-16 DIAGNOSIS — S0636AA Traumatic hemorrhage of cerebrum, unspecified, with loss of consciousness status unknown, initial encounter: Secondary | ICD-10-CM

## 2023-11-16 DIAGNOSIS — Z79899 Other long term (current) drug therapy: Secondary | ICD-10-CM | POA: Diagnosis not present

## 2023-11-16 DIAGNOSIS — Z681 Body mass index (BMI) 19 or less, adult: Secondary | ICD-10-CM | POA: Diagnosis not present

## 2023-11-16 DIAGNOSIS — E1022 Type 1 diabetes mellitus with diabetic chronic kidney disease: Secondary | ICD-10-CM | POA: Diagnosis present

## 2023-11-16 DIAGNOSIS — R296 Repeated falls: Secondary | ICD-10-CM | POA: Diagnosis present

## 2023-11-16 DIAGNOSIS — S06341A Traumatic hemorrhage of right cerebrum with loss of consciousness of 30 minutes or less, initial encounter: Secondary | ICD-10-CM | POA: Diagnosis present

## 2023-11-16 DIAGNOSIS — I129 Hypertensive chronic kidney disease with stage 1 through stage 4 chronic kidney disease, or unspecified chronic kidney disease: Secondary | ICD-10-CM | POA: Diagnosis present

## 2023-11-16 DIAGNOSIS — W19XXXA Unspecified fall, initial encounter: Secondary | ICD-10-CM | POA: Diagnosis not present

## 2023-11-16 DIAGNOSIS — G3184 Mild cognitive impairment, so stated: Secondary | ICD-10-CM | POA: Diagnosis present

## 2023-11-16 DIAGNOSIS — W19XXXD Unspecified fall, subsequent encounter: Secondary | ICD-10-CM | POA: Diagnosis not present

## 2023-11-16 DIAGNOSIS — Z794 Long term (current) use of insulin: Secondary | ICD-10-CM | POA: Diagnosis not present

## 2023-11-16 LAB — CBC
HCT: 27 % — ABNORMAL LOW (ref 39.0–52.0)
Hemoglobin: 8.8 g/dL — ABNORMAL LOW (ref 13.0–17.0)
MCH: 31.4 pg (ref 26.0–34.0)
MCHC: 32.6 g/dL (ref 30.0–36.0)
MCV: 96.4 fL (ref 80.0–100.0)
Platelets: 233 K/uL (ref 150–400)
RBC: 2.8 MIL/uL — ABNORMAL LOW (ref 4.22–5.81)
RDW: 16.9 % — ABNORMAL HIGH (ref 11.5–15.5)
WBC: 4.8 K/uL (ref 4.0–10.5)
nRBC: 0 % (ref 0.0–0.2)

## 2023-11-16 LAB — PROTIME-INR
INR: 1.3 — ABNORMAL HIGH (ref 0.8–1.2)
Prothrombin Time: 17.3 s — ABNORMAL HIGH (ref 11.4–15.2)

## 2023-11-16 LAB — ECHOCARDIOGRAM COMPLETE
AR max vel: 2.28 cm2
AV Area VTI: 2.51 cm2
AV Area mean vel: 2.28 cm2
AV Mean grad: 3 mmHg
AV Peak grad: 5.8 mmHg
Ao pk vel: 1.2 m/s
Area-P 1/2: 3.33 cm2
Calc EF: 59.3 %
Height: 70 in
MV VTI: 1.64 cm2
S' Lateral: 2 cm
Single Plane A2C EF: 61 %
Single Plane A4C EF: 54 %
Weight: 2179.91 [oz_av]

## 2023-11-16 LAB — LIPID PANEL
Cholesterol: 104 mg/dL (ref 0–200)
HDL: 49 mg/dL (ref >40)
LDL Cholesterol: 44 mg/dL (ref 0–99)
Total CHOL/HDL Ratio: 2.1 ratio
Triglycerides: 54 mg/dL (ref <150)
VLDL: 11 mg/dL (ref 0–40)

## 2023-11-16 LAB — COMPREHENSIVE METABOLIC PANEL WITH GFR
ALT: 27 U/L (ref 0–44)
AST: 20 U/L (ref 15–41)
Albumin: 2.9 g/dL — ABNORMAL LOW (ref 3.5–5.0)
Alkaline Phosphatase: 76 U/L (ref 38–126)
Anion gap: 9 (ref 5–15)
BUN: 24 mg/dL — ABNORMAL HIGH (ref 8–23)
CO2: 23 mmol/L (ref 22–32)
Calcium: 8.2 mg/dL — ABNORMAL LOW (ref 8.9–10.3)
Chloride: 101 mmol/L (ref 98–111)
Creatinine, Ser: 1.12 mg/dL (ref 0.61–1.24)
GFR, Estimated: >60 mL/min (ref >60)
Glucose, Bld: 237 mg/dL — ABNORMAL HIGH (ref 70–99)
Potassium: 4 mmol/L (ref 3.5–5.1)
Sodium: 133 mmol/L — ABNORMAL LOW (ref 135–145)
Total Bilirubin: 1.2 mg/dL (ref 0.0–1.2)
Total Protein: 8.2 g/dL — ABNORMAL HIGH (ref 6.5–8.1)

## 2023-11-16 LAB — URINALYSIS, ROUTINE W REFLEX MICROSCOPIC
Bilirubin Urine: NEGATIVE
Glucose, UA: >=500 mg/dL — AB
Hgb urine dipstick: NEGATIVE
Ketones, ur: 5 mg/dL — AB
Leukocytes,Ua: NEGATIVE
Nitrite: NEGATIVE
Protein, ur: NEGATIVE mg/dL
Specific Gravity, Urine: 1.016 (ref 1.005–1.030)
pH: 5 (ref 5.0–8.0)

## 2023-11-16 LAB — GLUCOSE, CAPILLARY
Glucose-Capillary: 197 mg/dL — ABNORMAL HIGH (ref 70–99)
Glucose-Capillary: 325 mg/dL — ABNORMAL HIGH (ref 70–99)
Glucose-Capillary: 207 mg/dL — ABNORMAL HIGH (ref 70–99)
Glucose-Capillary: 186 mg/dL — ABNORMAL HIGH (ref 70–99)
Glucose-Capillary: 200 mg/dL — ABNORMAL HIGH (ref 70–99)

## 2023-11-16 MED ORDER — INSULIN ASPART 100 UNIT/ML IJ SOLN
0.0000 [IU] | Freq: Every day | INTRAMUSCULAR | Status: DC
  Administered 2023-11-19 – 2023-11-20 (×2): 2 [IU] via SUBCUTANEOUS
  Administered 2023-11-21: 3 [IU] via SUBCUTANEOUS
  Administered 2023-11-24: 4 [IU] via SUBCUTANEOUS
  Filled 2023-11-16 (×4): qty 1

## 2023-11-16 MED ORDER — INSULIN GLARGINE 100 UNIT/ML ~~LOC~~ SOLN
5.0000 [IU] | Freq: Every day | SUBCUTANEOUS | Status: DC
  Administered 2023-11-16 – 2023-11-17 (×2): 5 [IU] via SUBCUTANEOUS
  Filled 2023-11-16 (×2): qty 0.05

## 2023-11-16 MED ORDER — MIDODRINE HCL 5 MG PO TABS
2.5000 mg | ORAL_TABLET | Freq: Three times a day (TID) | ORAL | Status: DC
  Administered 2023-11-16 – 2023-11-25 (×8): 2.5 mg via ORAL
  Filled 2023-11-16 (×15): qty 1

## 2023-11-16 MED ORDER — DIAZEPAM 5 MG/ML IJ SOLN
2.5000 mg | Freq: Once | INTRAMUSCULAR | Status: AC | PRN
  Administered 2023-11-16: 2.5 mg via INTRAVENOUS
  Filled 2023-11-16: qty 2

## 2023-11-16 MED ORDER — ACYCLOVIR 200 MG PO CAPS
400.0000 mg | ORAL_CAPSULE | Freq: Every day | ORAL | Status: DC
  Administered 2023-11-16 – 2023-11-26 (×11): 400 mg via ORAL
  Filled 2023-11-16 (×11): qty 2

## 2023-11-16 MED ORDER — INSULIN ASPART 100 UNIT/ML IJ SOLN
0.0000 [IU] | Freq: Three times a day (TID) | INTRAMUSCULAR | Status: DC
  Administered 2023-11-16: 1 [IU] via SUBCUTANEOUS
  Administered 2023-11-16 – 2023-11-17 (×2): 2 [IU] via SUBCUTANEOUS
  Administered 2023-11-17: 1 [IU] via SUBCUTANEOUS
  Administered 2023-11-18: 3 [IU] via SUBCUTANEOUS
  Administered 2023-11-18: 1 [IU] via SUBCUTANEOUS
  Administered 2023-11-19: 2 [IU] via SUBCUTANEOUS
  Administered 2023-11-19: 1 [IU] via SUBCUTANEOUS
  Administered 2023-11-19: 4 [IU] via SUBCUTANEOUS
  Administered 2023-11-20: 5 [IU] via SUBCUTANEOUS
  Administered 2023-11-20 – 2023-11-21 (×2): 3 [IU] via SUBCUTANEOUS
  Administered 2023-11-22: 5 [IU] via SUBCUTANEOUS
  Administered 2023-11-22 – 2023-11-23 (×2): 4 [IU] via SUBCUTANEOUS
  Administered 2023-11-23 – 2023-11-24 (×2): 2 [IU] via SUBCUTANEOUS
  Administered 2023-11-24: 1 [IU] via SUBCUTANEOUS
  Administered 2023-11-25: 6 [IU] via SUBCUTANEOUS
  Filled 2023-11-16 (×18): qty 1

## 2023-11-16 MED ORDER — GABAPENTIN 300 MG PO CAPS
300.0000 mg | ORAL_CAPSULE | Freq: Two times a day (BID) | ORAL | Status: DC
  Administered 2023-11-16 – 2023-11-26 (×21): 300 mg via ORAL
  Filled 2023-11-16 (×21): qty 1

## 2023-11-16 MED ORDER — IOHEXOL 350 MG/ML SOLN
75.0000 mL | Freq: Once | INTRAVENOUS | Status: AC | PRN
  Administered 2023-11-16: 75 mL via INTRAVENOUS

## 2023-11-16 MED ORDER — RISPERIDONE 0.5 MG PO TABS
0.5000 mg | ORAL_TABLET | Freq: Three times a day (TID) | ORAL | Status: DC | PRN
  Administered 2023-11-16 – 2023-11-23 (×3): 0.5 mg via ORAL
  Filled 2023-11-16 (×3): qty 1

## 2023-11-16 MED ORDER — QUETIAPINE FUMARATE 25 MG PO TABS: 25.0000 mg | ORAL_TABLET | Freq: Every day | ORAL | Status: DC

## 2023-11-16 MED ORDER — QUETIAPINE FUMARATE 25 MG PO TABS: 12.5000 mg | ORAL_TABLET | Freq: Every day | ORAL | Status: DC

## 2023-11-16 NOTE — Consult Note (Signed)
 NEUROLOGY CONSULT NOTE   Date of service: November 16, 2023 Patient Name: Nicholas Weiss MRN:  [id number] DOB:  02-Jun-1950 Chief complaint: Fall Requesting Provider: Josette Ade, MD  History of Present Illness  Nicholas Weiss is a 73 y.o. male with hx of type 1 diabetes, chronic orthostatic hypotension, paroxysmal atrial fibrillation on Eliquis , seizures, mild cognitive impairment listed in the chart with no family to provide further baseline, recent fall and subdural hematoma in June 2025, also with recent admission 11/11/2023 for hyperosmolar nonketotic state as well as AKI on CKD, presented yesterday to [facility] emergency department after a fall.  Brain imaging revealed intraventricular hemorrhage.  Eliquis  was discontinued and he was admitted for further evaluation and management.  Neurosurgery was consulted.  No emergent neurosurgical intervention. Neurology consult requested for opinion on whether this could be a hemorrhagic stroke.  LKW: Prior to arrival-unclear Modified rankin score: Unable to reliably ascertain IV Thrombolysis: No-ICH EVT: No-ICH ICH Score:1 (for IVH)  NIHSS components Score: Comment  1a Level of Conscious 0[x]  1[]  2[]  3[]      1b LOC Questions 0[]  1[]  2[x]       1c LOC Commands 0[x]  1[]  2[]       2 Best Gaze 0[x]  1[]  2[]       3 Visual 0[x]  1[]  2[]  3[]      4 Facial Palsy 0[x]  1[]  2[]  3[]      5a Motor Arm - left 0[]  1[]  2[]  3[]  4[]  UN[x]  Fracture  5b Motor Arm - Right 0[x]  1[]  2[]  3[]  4[]  UN[]    6a Motor Leg - Left 0[x]  1[]  2[]  3[]  4[]  UN[]    6b Motor Leg - Right 0[x]  1[]  2[]  3[]  4[]  UN[]    7 Limb Ataxia 0[x]  1[]  2[]  UN[]      8 Sensory 0[x]  1[]  2[]  UN[]      9 Best Language 0[x]  1[]  2[]  3[]      10 Dysarthria 0[]  1[x]  2[]  UN[]      11 Extinct. and Inattention 0[x]  1[]  2[]       TOTAL: 3 (UN)      ROS  Comprehensive ROS performed and pertinent positives documented in HPI    Past History   Past Medical History:  Diagnosis Date    Acute metabolic encephalopathy 12/02/2020   DKA (diabetic ketoacidoses) 04/06/2016   Hypercholesteremia    Hypertension     Past Surgical History:  Procedure Laterality Date   AMPUTATION TOE Left 04/25/2022   Procedure: LEFT GREAT TOE AMPUTATION AND LEFT PARTIAL 2ND TOE AMPUTATION;  Surgeon: Janit Thresa HERO, DPM;  Location: [facility] ORS;  Service: Podiatry;  Laterality: Left;   COLONOSCOPY WITH PROPOFOL  N/A 02/01/2020   Procedure: COLONOSCOPY WITH PROPOFOL ;  Surgeon: Unk Corinn Skiff, MD;  Location: [facility] ENDOSCOPY;  Service: Gastroenterology;  Laterality: N/A;   ESOPHAGOGASTRODUODENOSCOPY  02/01/2020   Procedure: ESOPHAGOGASTRODUODENOSCOPY (EGD);  Surgeon: Unk Corinn Skiff, MD;  Location: [facility] ENDOSCOPY;  Service: Gastroenterology;;   ESOPHAGOGASTRODUODENOSCOPY (EGD) WITH PROPOFOL  N/A 06/07/2022   Procedure: ESOPHAGOGASTRODUODENOSCOPY (EGD) WITH PROPOFOL ;  Surgeon: Saintclair Jasper, MD;  Location: [facility] ENDOSCOPY;  Service: Gastroenterology;  Laterality: N/A;   KNEE SURGERY Right    Torn meniscus   KNEE SURGERY Left     Family History: Family History  Problem Relation Age of Onset   Heart attack Father    Hypertension Sister    Cancer Sister     Social History  reports that he has never smoked. He has never used smokeless tobacco. He reports that he does not currently use alcohol.  He reports current drug use. Drugs: Marijuana and Crack cocaine.  Allergies  Allergen Reactions   Penicillins Other (See Comments)    Childhood allergy -tolerated amoxil  03/2022 Unknown reaction Has patient had a PCN reaction causing immediate rash, facial/tongue/throat swelling, SOB or lightheadedness with hypotension: No Has patient had a PCN reaction causing severe rash involving mucus membranes or skin necrosis: No Has patient had a PCN reaction that required hospitalization No Has patient had a PCN reaction occurring within the last 10 years: No If all of the above answers are NO, then may proceed with  Cephalosporin use.     Medications   Current Facility-Administered Medications:    acetaminophen  (TYLENOL ) tablet 650 mg, 650 mg, Oral, Q6H PRN **OR** acetaminophen  (TYLENOL ) suppository 650 mg, 650 mg, Rectal, Q6H PRN, Paudel, Keshab, MD   acyclovir  (ZOVIRAX ) 200 MG capsule 400 mg, 400 mg, Oral, Daily, Wieting, Richard, MD, 400 mg at 11/16/23 0957   atorvastatin  (LIPITOR ) tablet 80 mg, 80 mg, Oral, QHS, Paudel, Keshab, MD, 80 mg at 11/15/23 2218   Chlorhexidine  Gluconate Cloth 2 % PADS 6 each, 6 each, Topical, Daily, Paudel, Keshab, MD, 6 each at 11/16/23 0950   gabapentin  (NEURONTIN ) capsule 300 mg, 300 mg, Oral, BID, Wieting, Richard, MD, 300 mg at 11/16/23 9042   insulin  aspart (novoLOG ) injection 0-5 Units, 0-5 Units, Subcutaneous, QHS, Paudel, Keshab, MD   insulin  aspart (novoLOG ) injection 0-9 Units, 0-9 Units, Subcutaneous, TID WC, Paudel, Keshab, MD, 7 Units at 11/16/23 0802   latanoprost  (XALATAN ) 0.005 % ophthalmic solution 1 drop, 1 drop, Both Eyes, QHS, Paudel, Keshab, MD, 1 drop at 11/15/23 2237   levETIRAcetam  (KEPPRA ) tablet 500 mg, 500 mg, Oral, BID, Paudel, Keshab, MD, 500 mg at 11/16/23 0803   midodrine  (PROAMATINE ) tablet 5 mg, 5 mg, Oral, TID, Paudel, Keshab, MD   Oral care mouth rinse, 15 mL, Mouth Rinse, PRN, Paudel, Keshab, MD   oxyCODONE -acetaminophen  (PERCOCET/ROXICET) 5-325 MG per tablet 1 tablet, 1 tablet, Oral, Q6H PRN, Paudel, Keshab, MD   polyethylene glycol (MIRALAX  / GLYCOLAX ) packet 17 g, 17 g, Oral, Daily PRN, Paudel, Keshab, MD  Facility-Administered Medications Ordered in Other Encounters:    epoetin  alfa-epbx (RETACRIT ) injection 40,000 Units, 40,000 Units, Subcutaneous, Q21 days, Melanee Annah BROCKS, MD, 40,000 Units at 07/09/23 1153  Vitals   Vitals:   11/16/23 0715 11/16/23 0730 11/16/23 0745 11/16/23 0800  BP:      Pulse: 96 78 (!) 103   Resp: 18 (!) 26 19   Temp:    98.9 F (37.2 C)  TempSrc:    Axillary  SpO2: 99% 98% 92%   Weight:       Height:        Body mass index is 19.55 kg/m.   Physical Exam   General: He appears to be awake alert and confused, in no distress HEENT: Normocephalic, atraumatic Lungs: Clear Cardiovascular: Rhythm and rate-irregularly irregular Neurological exam He is awake, alert, oriented to place but not to person or time.  Mild dysarthria.  No evidence of aphasia Cranial nerves II to XII intact Motor examination: Left upper extremity limited exam due to fracture.  Otherwise no drift in any of the 4 extremities Sensation intact to light touch No gross dysmetria noted Gait testing deferred at this time  Labs/Imaging/Neurodiagnostic studies   CBC:  Recent Labs  Lab 12/09/23 0832 11/11/23 1523 11/15/23 0619 11/16/23 0307  WBC 3.2* 4.1 4.7 4.8  NEUTROABS 1.6* 2.7  --   --   HGB 8.4*  9.7* 9.2* 8.8*  HCT 25.7* 30.5* 28.3* 27.0*  MCV 97.7 100.0 97.6 96.4  PLT 262 194 237 233   Basic Metabolic Panel:  Lab Results  Component Value Date   NA 133 (L) 11/16/2023   K 4.0 11/16/2023   CO2 23 11/16/2023   GLUCOSE 237 (H) 11/16/2023   BUN 24 (H) 11/16/2023   CREATININE 1.12 11/16/2023   CALCIUM  8.2 (L) 11/16/2023   GFRNONAA >60 11/16/2023   GFRAA 54 (L) 03/15/2020   Lipid Panel:  Lab Results  Component Value Date   LDLCALC 44 11/16/2023   HgbA1c:  Lab Results  Component Value Date   HGBA1C 10.0 (H) 07/21/2023   Urine Drug Screen:     Component Value Date/Time   LABOPIA NONE DETECTED 09/16/2023 1821   COCAINSCRNUR NONE DETECTED 09/16/2023 1821   LABBENZ NONE DETECTED 09/16/2023 1821   AMPHETMU NONE DETECTED 09/16/2023 1821   THCU NONE DETECTED 09/16/2023 1821   LABBARB NONE DETECTED 09/16/2023 1821    Alcohol Level     Component Value Date/Time   ETH <15 09/11/2023 1930   INR  Lab Results  Component Value Date   INR 1.3 (H) 11/16/2023   APTT  Lab Results  Component Value Date   APTT 111 (H) 08/16/2023   AED levels:  Lab Results  Component Value Date    LEVETIRACETA 31.5 08/26/2023    CT Head without contrast, CT C-spine upon arrival to ED (Personally reviewed): Small volume acute IVH greater on the right and likely posttraumatic.  No associated ventriculomegaly.  No other acute intracranial abnormality or acute traumatic injury identified.  Degenerative changes in the cervical spine without evidence of an acute fracture or traumatic subluxation  Repeat CT head while in the hospital: Similar focus of IVH within the body of the right lateral ventricle.  Additional small focus of hemorrhage within the body of the left lateral ventricle-slightly decreased in size.  Small focus of hemorrhage in the right occipital horn is new from prior-probably redistribution of blood products.  ASSESSMENT   LUCKY TROTTA is a 73 y.o. male with hx of type 1 diabetes, chronic orthostatic hypotension, paroxysmal atrial fibrillation on Eliquis , seizures, mild cognitive impairment listed in the chart with no family to provide further baseline, recent fall and subdural hematoma in June 2025, also with recent admission 11/11/2023 for hyperosmolar nonketotic state as well as AKI on CKD, presented yesterday to [facility] emergency department after a fall.  Brain imaging revealed intraventricular hemorrhage.  Repeat head CT shows stable hemorrhage with redistribution of blood products. Neurosurgical consultation reviewed and appreciated.  No emergent neurosurgical intervention at this time. Question about this being a hemorrhagic stroke.  I do not see any focus of bleed in the thalamus or surrounding basal ganglia that could have caused an intraventricular hemorrhage.  Given his history of falls and him being on anticoagulant, this is likely a traumatic and coagulopathic bleed.  Impression: Acute intraventricular hemorrhage-etiology trauma from fall in the setting of coagulopathy.  RECOMMENDATIONS  He is greater than 24 hours from his presentation and  the exam is been stable.  Blood pressure goal can be liberalized.  Systolic blood pressure goal 110-160. Hold Eliquis .  No antiplatelets or anticoagulants. I would recommend not resuming Eliquis  on discharge.  I would recommend short-term follow-up with outpatient neurology and neurosurgery with a repeat head CT in about 2 weeks.  Careful consideration should be given about resuming Eliquis  since he has had a subdural hematoma  in June while on Eliquis  and now an intraventricular hemorrhage.  There were also concerns about his cognitive status which needs outpatient neuropsychological testing. CT angio head and neck to rule out an underlying vascular malformation MRI brain to rule out any likelihood of a underlying ischemic infarct with hemorrhagic transformation that might of led to this bleed but seems less likely. Therapy assessments Medical management of medical comorbidities per primary team as you are. Plan was discussed with Dr. Josette  ______________________________________________________________________    Signed, Eligio Lav, MD [facility]

## 2023-11-16 NOTE — Progress Notes (Signed)
 Neurosurgery Progress Note  History: Nicholas Weiss is here for falls and intraventricular hemorrhage.  HD2: Lethargic after getting medications for CTA.  Physical Exam: Vitals:   11/16/23 1700 11/16/23 1715  BP: 103/70   Pulse: (!) 55 (!) 57  Resp: 13 11  Temp:    SpO2: 99% 100%   Asleep but can be aroused.  MAE to pain.  Data:  Other tests/results: CTA reviewed - agree with findings MRI reviewed   Assessment/Plan:  Nicholas Weiss is here for IVH with syncope  - mobilize - no further imaging needed from NSGY standpoint unless he has a change in symptoms - DVT prophylaxis ok to start - PTOT    Reeves Daisy MD, [facility] Department of Neurosurgery

## 2023-11-16 NOTE — Assessment & Plan Note (Addendum)
 Resolved

## 2023-11-16 NOTE — Evaluation (Signed)
 Clinical/Bedside Swallow Evaluation Patient Details  Name: Nicholas Weiss MRN: [id number] Date of Birth: [birth year]  Today's Date: 11/16/2023 Time: SLP Start Time (ACUTE ONLY): 0835 SLP Stop Time (ACUTE ONLY): 0850 SLP Time Calculation (min) (ACUTE ONLY): 15 min  Past Medical History:  Past Medical History:  Diagnosis Date   Acute metabolic encephalopathy 12/02/2020   DKA (diabetic ketoacidoses) 04/06/2016   Hypercholesteremia    Hypertension    Past Surgical History:  Past Surgical History:  Procedure Laterality Date   AMPUTATION TOE Left 04/25/2022   Procedure: LEFT GREAT TOE AMPUTATION AND LEFT PARTIAL 2ND TOE AMPUTATION;  Surgeon: Janit Thresa HERO, DPM;  Location: [facility] ORS;  Service: Podiatry;  Laterality: Left;   COLONOSCOPY WITH PROPOFOL  N/A 02/01/2020   Procedure: COLONOSCOPY WITH PROPOFOL ;  Surgeon: Unk Corinn Skiff, MD;  Location: [facility] ENDOSCOPY;  Service: Gastroenterology;  Laterality: N/A;   ESOPHAGOGASTRODUODENOSCOPY  02/01/2020   Procedure: ESOPHAGOGASTRODUODENOSCOPY (EGD);  Surgeon: Unk Corinn Skiff, MD;  Location: [facility] ENDOSCOPY;  Service: Gastroenterology;;   ESOPHAGOGASTRODUODENOSCOPY (EGD) WITH PROPOFOL  N/A 06/07/2022   Procedure: ESOPHAGOGASTRODUODENOSCOPY (EGD) WITH PROPOFOL ;  Surgeon: Saintclair Jasper, MD;  Location: [facility] ENDOSCOPY;  Service: Gastroenterology;  Laterality: N/A;   KNEE SURGERY Right    Torn meniscus   KNEE SURGERY Left    HPI:  Pt is a Nicholas Weiss who presented to ED today following syncopal episode. Pt recently admitted to hospital s/p fall. Pt with hx of recurrent falls, SDH, LUE fx, hyperosmolar nonketotic status, DMI with poor glycemic control, acute on chronic CKD, orthostatic hypotension, a-fb, sz, mult myeloma, HLC, MCI. Per chart review, cognitive deficits have been ongoing and confirmed with family.  Has follow-up with neurologist as per the family in the next few weeks for further workup. (Per D/C Summary on 11/13/23). Head CT, 11/15/23, 1.  Similar focus of intraventricular hemorrhage within the body of the right  lateral ventricle.  2. Additional smaller focus of hemorrhage within the body of the left lateral  ventricle, slightly decreased in size.  3. Small focus of hemorrhage in the right occipital horn is new from prior,  possibly redistribution of blood products.    Assessment / Plan / Recommendation  Clinical Impression  Pt seen for clinical swallowing evaluation. Pt alert, restless. Notably confused. Safety sitter present. B mitts donned for duration of evaluation. Pt demonstrated s/sx at least a moderate oral dysphagia c/b oral holding, delayed oral transit, trace-mild oral stasis, and prolonged oral prep/mastication of purees and solids. Oral prep/mastication time significantly increased with solids. Suspect impacts of dentition and AMS affecting performance on today's evaluation. Recommend a puree diet with thin liquids with standard aspiration precautions and meds as tolerated. Noted limited PO intake noted. Pt may benefit from RD consult and Palliative Care Consult. SLP to f/u per POC for diet tolerance and re-assessment pending improvements in participation. SLP Visit Diagnosis: Dysphagia, oral phase (R13.11)    Aspiration Risk  Mild aspiration risk;Moderate aspiration risk;Risk for inadequate nutrition/hydration    Diet Recommendation Dysphagia 1 (Puree);Thin liquid    Liquid Administration via: Spoon;Cup;Straw Medication Administration:  (as tolerated) Supervision: Staff to assist with self feeding;Full supervision/cueing for compensatory strategies Compensations: Minimize environmental distractions;Slow rate;Small sips/bites;Follow solids with liquid Postural Changes: Seated upright at 90 degrees;Remain upright for at least 30 minutes after po intake    Other  Recommendations Recommended Consults:  (Consider RD consult and Palliative Care Consults) Oral Care Recommendations: Oral care QID;Staff/trained caregiver to  provide oral care        Functional  Status Assessment Patient has had a recent decline in their functional status and demonstrates the ability to make significant improvements in function in a reasonable and predictable amount of time.  Frequency and Duration min 2x/week  2 weeks       Prognosis Prognosis for improved oropharyngeal function: Fair Barriers to Reach Goals: Cognitive deficits;Behavior      Swallow Study   General HPI: Pt is a Nicholas Weiss who presented to ED today following syncopal episode. Pt recently admitted to hospital s/p fall. Pt with hx of recurrent falls, SDH, LUE fx, hyperosmolar nonketotic status, DMI with poor glycemic control, acute on chronic CKD, orthostatic hypotension, a-fb, sz, mult myeloma, HLC, MCI. Per chart review, cognitive deficits have been ongoing and confirmed with family.  Has follow-up with neurologist as per the family in the next few weeks for further workup. (Per D/C Summary on 11/13/23). Head CT, 11/15/23, 1. Similar focus of intraventricular hemorrhage within the body of the right  lateral ventricle.  2. Additional smaller focus of hemorrhage within the body of the left lateral  ventricle, slightly decreased in size.  3. Small focus of hemorrhage in the right occipital horn is new from prior,  possibly redistribution of blood products. Type of Study: Bedside Swallow Evaluation Previous Swallow Assessment: 08/16/23 recommended mech soft and thin Diet Prior to this Study: Regular;Thin liquids (Level 0) Temperature Spikes Noted: No Respiratory Status: Room air History of Recent Intubation: No Behavior/Cognition: Alert;Confused;Distractible;Requires cueing Oral Cavity Assessment: Within Functional Limits Oral Care Completed by SLP: Recent completion by staff Oral Cavity - Dentition: Edentulous Vision: Functional for self-feeding Self-Feeding Abilities: Total assist Patient Positioning: Upright in bed Baseline Vocal Quality: Normal Volitional  Cough: Cognitively unable to elicit Volitional Swallow: Unable to elicit    Oral/Motor/Sensory Function Overall Oral Motor/Sensory Function:  (UTA)   Ice Chips Ice chips: Not tested   Thin Liquid Thin Liquid: Within functional limits Presentation: Straw    Nectar Thick Nectar Thick Liquid: Not tested   Honey Thick Honey Thick Liquid: Not tested   Puree Puree: Impaired Oral Phase Impairments: Poor awareness of bolus Oral Phase Functional Implications: Oral holding;Prolonged oral transit;Oral residue Pharyngeal Phase Impairments:  [facility])   Solid     Solid: Impaired Oral Phase Impairments: Poor awareness of bolus;Impaired mastication Oral Phase Functional Implications: Prolonged oral transit;Impaired mastication;Oral residue;Oral holding Pharyngeal Phase Impairments:  (WFL)     Delon Bangs, M.S., CCC-SLP Speech-Language Pathologist [facility] [phone] (ASCOM)  Delon CHRISTELLA Bangs 11/16/2023,9:09 AM

## 2023-11-16 NOTE — Assessment & Plan Note (Signed)
Continue Keppra and gabapentin

## 2023-11-16 NOTE — Assessment & Plan Note (Addendum)
 Stroke risk will be higher being off Eliquis  with his atrial fibrillation.  This is the second time back in the hospital with a brain bleed.  Benefits and risks of anticoagulation was discontinued

## 2023-11-16 NOTE — Assessment & Plan Note (Addendum)
 History of orthostatic hypotension.   Continue midodrine  with holding parameters if blood pressure greater than 120 for now.

## 2023-11-16 NOTE — Assessment & Plan Note (Addendum)
 Uses insulin  pump at home.   Continue Semglee  insulin  plus sliding scale.

## 2023-11-16 NOTE — Evaluation (Signed)
 Occupational Therapy Evaluation Patient Details Name: Nicholas Weiss MRN: [id number] DOB: [birth year] Today's Date: 11/16/2023   History of Present Illness   73 y.o. male with medical history significant for type I DM, HLD, recent history of fall with subdural hematoma in June 2025, chronic orthostatic hypotension, A-fib on Eliquis , seizure disorder, mild cognitive impairment, EtOH use who was brought in from home after he lost consciousness, fell on the ground.  Patient's wife reported that he did not hit his head.  Patient was confused and not able to provide meaningful history.  Patient's wife provided the history that she heard him fall, stepped out of the room and found him lying down with facedown forward.  Patient was unconscious about 5 to 10 minutes.  No convulsions.  She also stated that he has a frequent falls.  No history of recent infections, cough cold, fever, sick contact.  Patient normally ambulates with a walker at home.  He is confused at baseline at home.  Patient was recently admitted to the hospital between 11/11/2023 to 11/13/2023 after presenting a fall.  As noted above he had a similar incident with the fall in June 2025 and had a subdural hematoma and a fractured left upper extremity.  He also had insulin  pump malfunction and presented with nonketotic hyperosmolar state in August 2025.     Clinical Impressions Patient presenting with decreased Ind in self care,balance, functional mobility/transfers, endurance, cognition, and safety awareness. Per chart review, pt with daily falls and living with significant other and using SPC for moblity. Pt needs assistance with self care. Patient very lethargic this session likely from medications recently given for recent CT scan. Pt awakens briefly a few times during session. Max A for roll in L <> R in bed. Pt placed into chair position and total A for oral care. Pt does not alert further during session and therefore session terminated.  Patient will benefit from acute OT to increase overall independence in the areas of ADLs, functional mobility, and safety awareness in order to safely discharge.     If plan is discharge home, recommend the following:   A lot of help with bathing/dressing/bathroom;Assistance with cooking/housework;Assistance with feeding;Direct supervision/assist for medications management;Direct supervision/assist for financial management;Assist for transportation;Help with stairs or ramp for entrance;A lot of help with walking and/or transfers     Functional Status Assessment   Patient has had a recent decline in their functional status and demonstrates the ability to make significant improvements in function in a reasonable and predictable amount of time.     Equipment Recommendations   Other (comment) (defer to next venue of care)      Precautions/Restrictions   Precautions Precautions: Fall Recall of Precautions/Restrictions: Impaired Precaution/Restrictions Comments: h/o orthostatic hypotension Required Braces or Orthoses: Sling (L UE) Restrictions Weight Bearing Restrictions Per Provider Order: No     Mobility Bed Mobility Overal bed mobility: Needs Assistance Bed Mobility: Rolling Rolling: Max assist              Transfers                   General transfer comment: unable to attempt secondary to lethargy          ADL either performed or assessed with clinical judgement   ADL Overall ADL's : At baseline  Vision Baseline Vision/History: 1 Wears glasses Patient Visual Report: No change from baseline              Pertinent Vitals/Pain Pain Assessment Pain Assessment: Faces Faces Pain Scale: No hurt     Extremity/Trunk Assessment Upper Extremity Assessment Upper Extremity Assessment: Generalized weakness LUE Deficits / Details: L UE humerus fx in June. Still needs to be in sling??            Communication Communication Communication: Impaired Factors Affecting Communication: Hearing impaired   Cognition Arousal: Lethargic, Suspect due to medications   Cognition: History of cognitive impairments                               Following commands: Impaired       Cueing  General Comments   Cueing Techniques: Verbal cues;Gestural cues              Home Living Family/patient expects to be discharged to:: Private residence Living Arrangements: Spouse/significant other;Children Available Help at Discharge: Family;Available 24 hours/day Type of Home: House Home Access: Stairs to enter [employer] of Steps: 1 Entrance Stairs-Rails: Can reach both Home Layout: One level     Bathroom Shower/Tub: Producer, television/film/video: Standard     Home Equipment: Cane - quad;Shower seat;Grab bars - toilet;Grab bars - tub/shower;Cane - single Librarian, academic (2 wheels)   Additional Comments: uses SPC in RUE for gait. History obtained from prior admission.      Prior Functioning/Environment Prior Level of Function : Needs assist             Mobility Comments: . Reports use of SPC for ambulation and wears sling on L UE. ADLs Comments: assisted for showering, dressing and all IADLs, pt with daily falls    OT Problem List: Decreased strength;Decreased safety awareness;Decreased activity tolerance;Impaired balance (sitting and/or standing)   OT Treatment/Interventions: Self-care/ADL training;Therapeutic activities;Therapeutic exercise;Cognitive remediation/compensation;Energy conservation;Patient/family education;Balance training;Manual therapy      OT Goals(Current goals can be found in the care plan section)   Acute Rehab OT Goals Patient Stated Goal: none stated OT Goal Formulation: Patient unable to participate in goal setting Time For Goal Achievement: 11/30/23 Potential to Achieve Goals: Fair ADL Goals Pt Will  Perform Grooming: with supervision;sitting Pt Will Perform Lower Body Dressing: with min assist;sit to/from stand Pt Will Transfer to Toilet: with min assist;ambulating Pt Will Perform Toileting - Clothing Manipulation and hygiene: with min assist;sit to/from stand   OT Frequency:  Min 2X/week       AM-PAC OT 6 Clicks Daily Activity     Outcome Measure Help from another person eating meals?: A Little Help from another person taking care of personal grooming?: A Little Help from another person toileting, which includes using toliet, bedpan, or urinal?: A Lot Help from another person bathing (including washing, rinsing, drying)?: A Lot Help from another person to put on and taking off regular upper body clothing?: A Little Help from another person to put on and taking off regular lower body clothing?: A Lot 6 Click Score: 15   End of Session    Activity Tolerance: Patient limited by lethargy Patient left: in chair;with call bell/phone within reach;with nursing/sitter in room  OT Visit Diagnosis: Unsteadiness on feet (R26.81);Other symptoms and signs involving cognitive function;Muscle weakness (generalized) (M62.81);History of falling (Z91.81);Repeated falls (R29.6)  Time: 8486-8469 OT Time Calculation (min): 17 min Charges:  OT General Charges $OT Visit: 1 Visit OT Evaluation $OT Eval Moderate Complexity: 1 [address] [place], OTR/L , CBIS ascom [phone]  11/16/23, 3:53 PM

## 2023-11-16 NOTE — Assessment & Plan Note (Addendum)
 Seen on hospitalization 2 months ago - Continue shoulder sling for support as needed -Repeat imaging with increased displacement of comminuted proximal humerus fracture since June which may represent delayed fracture healing or refracture at the site of previous fracture. -Need outpatient follow-up with orthopedic surgery

## 2023-11-16 NOTE — Progress Notes (Signed)
 Patient order for MRI. Dr. Josette stated it was okay for patient to be transported without telemetry.

## 2023-11-16 NOTE — Progress Notes (Signed)
 Patient arrived to floor at around 1840 in bed, accompanied with RN and sitter Placed in room 123. Patient alert to self, drowsy. Mitten to right hand. Tele placed and verified with NT. Afib 50's. SCD turned on. Informed by sitter that patient ate dinner. Patients personal insulin  pump in room left on work station, continues to work, informed by tx Charity fundraiser that pump could not be turned off. Side rails up. Bed alarm on. Safety measures in place. Will endorse to oncoming RN.

## 2023-11-16 NOTE — Hospital Course (Addendum)
 73 y.o. male with medical history significant for type I DM, HLD, recent history of fall with subdural hematoma in June 2025, chronic orthostatic hypotension, A-fib on Eliquis , seizure disorder, mild cognitive impairment, EtOH use who was brought in from home after he lost consciousness, fell on the ground.  Patient's wife reported that he did not hit his head.  Patient was confused and not able to provide meaningful history.  He was also having  frequent falls.    Patient was recently admitted to the hospital between 11/11/2023 to 11/13/2023 after presenting a fall.  As noted above he had a similar incident with the fall in June 2025 and had a subdural hematoma and a fractured left upper extremity.  He also had insulin  pump malfunction and presented with nonketotic hyperosmolar state in August 2025.  Workup revealed intraventricular bleed involving right lateral ventricle and occipital horn of right ventricle on CT scan of the brain, seen by neurosurgery recommended conservative management.  Neurology was consulted, MRI negative for acute stroke, recommend holding anticoagulation long-term due to recurrent falls and history of SDH and now with intraventricular hemorrhage.  Patient was found to be lethargic on 08/20, with CT stable   Rapid response was called on 08/22 as patient was found unresponsive, CT head stable, improved back to baseline within an hour with no intervention. Code stroke was called 08/23, CT stroke stable Consulted Cardiology for management of Orthostatic hypotension, labile  Eliquis  was held and will be discontinued on discharge for now until cleared by neurosurgery.  PT recommending SNF-intermittent confusion and require 48 hours of sitter free before leaving.  Palliative care was also consulted but patient remain full scope of care and full code.  8/27: Blood pressure elevated at 152/80, labs seem stable. Pending placement.  8/28: Remained hemodynamically stable, with some  increased somnolence, does not want to be bothered.  CBG elevated-change SSI to sensitive, mealtime coverage to 3 units and Lantus  to 8 units.  ABG also ordered. Consulted orthopedic at family request as they were concerned about more displaced shoulder fracture.  Message sent to Dr. Edie.  8/29: Remained hemodynamically stable, patient was sleeping most of the day yesterday and having few episodes of hypoglycemia.  More alert today.  Orthopedic surgery reevaluated him, no surgical intervention needed at this time, patient was placed in shoulder immobilizer and should wear it for next 3 to 4 weeks, only remove while bathing.  He need to have a follow-up with orthopedic surgery as outpatient.  His home Eliquis  was discontinued and should not be resumed until cleared by neurosurgery.  Blood pressure mildly elevated.  Low-dose midodrine  was made as needed because of his history of orthostatic vitals.  Should encourage p.o. hydration.  His Florinef  is also as needed and should be held if systolic blood pressure above 879.  Blood pressure need to be well-controlled with recent intraventricular bleed.  Patient will resume his insulin  pump on discharge and need to have a close follow-up with his primary care provider/endocrinologist for better control of diabetes.  He will continue on current medications and follow-up with his providers for further assistance.

## 2023-11-16 NOTE — Assessment & Plan Note (Addendum)
 Appreciate neurosurgical consultation recommended conservative management.  MRI does not show any acute stroke. CTA calcified atherosclerosis at carotid bifurcations approximately 50% stenosis of proximal cervical ICAs.  Severe stenosis right vertebral artery origin. -Repeat CT 08/20 unchanged focus of hemorrhage within body of right lateral ventricle, Layering hemorrhage within the occipital horns of both lateral ventricles, slightly increased (likely related to layering/ redistribution) - Holding Eliquis  at discharge. - Waxing and waning mental status with intermittent agitation -Encourage p.o. intake -Need to follow-up with outpatient neurology and neurosurgery with repeat CT in 2 weeks -PT is recommending SNF

## 2023-11-16 NOTE — Assessment & Plan Note (Addendum)
 Some variation in hemoglobin, currently at 8.4 -Starting on supplement -Continue to monitor -Transfuse if below 7

## 2023-11-16 NOTE — Progress Notes (Signed)
 Patient attempting to get out of bed and is forgetful that he is in the hospital. Sitter ordered for safety and fall prevention. Mitts applied to hands to prevent pulling at lines/cords.  Arlean FORBES Bowers, RN

## 2023-11-16 NOTE — Care Management Obs Status (Signed)
 MEDICARE OBSERVATION STATUS NOTIFICATION   Patient Details  Name: Nicholas Weiss MRN: [id number] Date of Birth: 09-14-1950   Medicare Observation Status Notification Given:   (friend asked for a mailed copy)    Rojelio SHAUNNA Rattler 11/16/2023, 9:50 AM

## 2023-11-16 NOTE — Progress Notes (Signed)
 Progress Note   Patient: Nicholas Weiss FMW:[id number] DOB: December 30, 1950 DOA: 11/15/2023     0 DOS: the patient was seen and examined on 11/16/2023   Brief hospital course: 73 y.o. male with medical history significant for type I DM, HLD, recent history of fall with subdural hematoma in June 2025, chronic orthostatic hypotension, A-fib on Eliquis , seizure disorder, mild cognitive impairment, EtOH use who was brought in from home after he lost consciousness, fell on the ground.  Patient's wife reported that he did not hit his head.  Patient was confused and not able to provide meaningful history.  Patient's wife provided the history that she heard him fall, stepped out of the room and found him lying down with facedown forward.  Patient was unconscious about 5 to 10 minutes.  No convulsions.  She also stated that he has a frequent falls.  No history of recent infections, cough cold, fever, sick contact.  Patient normally ambulates with a walker at home.  He is confused at baseline at home. Patient was recently admitted to the hospital between 11/11/2023 to 11/13/2023 after presenting a fall.  As noted above he had a similar incident with the fall in June 2025 and had a subdural hematoma and a fractured left upper extremity.  He also had insulin  pump malfunction and presented with nonketotic hyperosmolar state in August 2025.   ED Course: Upon arrival to the ED, patient is found to have focus of intraventricular right lateral ventricle and occipital horn of right ventricle on CT scan of the brain.  Neurosurgical team was contacted and they will come and see the patient.  As per emergency room provider they advised that patient does not need transfer and we can keep him here.  Hospitalist service was consulted for evaluation for admission for neurosurgical evaluation and monitoring.  8/19.  Patient able to answer some simple questions and follow some simple commands and lift his right arm and straight leg raise  up off the bed.  History of left humerus fracture.  Admitted with intraventricular hemorrhage.  Eliquis  on hold.  Assessment and Plan: * Intraventricular hemorrhage [facility]) Appreciate neurosurgical consultation recommended conservative management.  Appreciate neurological consultation.  MRI does not show any acute stroke.  Holding Eliquis .  PT and OT consultations.  Continue to monitor.  Syncope and collapse, recurrent History of orthostatic hypotension.  Continue midodrine  with holding parameters if blood pressure greater than 120 for now.  Once he starts moving around with physical therapy likely will have to give prior to ambulation  Uncontrolled type 1 diabetes mellitus with hyperglycemia, with long-term current use of insulin  (HCC) Uses insulin  pump at home.  He will do Semglee  insulin  plus sliding scale.  Hyponatremia Sodium 133  Seizure disorder (HCC) Continue Keppra  and gabapentin   Anemia of chronic disease Last hemoglobin 8.8  Left supracondylar humerus fracture, sequela Seen on hospitalization 2 months ago  MCI (mild cognitive impairment) with memory loss As needed risperidol for agitation. Seroquel  at night.        Subjective: Patient feels okay.  Offers no complaints.  Able to straight leg raise bilaterally.  Able to lift right arm up off the bed.  History of previous left humerus fracture not been able to do too much with the left arm.  Admitted with intraventricular hemorrhage.  Physical Exam: Vitals:   11/16/23 1045 11/16/23 1100 11/16/23 1145 11/16/23 1200  BP:   102/63 115/72  Pulse: 62 62    Resp: 16 18 18  13  Temp:    98.3 F (36.8 C)  TempSrc:    Axillary  SpO2: 96% 95%  95%  Weight:      Height:       Physical Exam HENT:     Head: Normocephalic.  Eyes:     General: Lids are normal.     Conjunctiva/sclera: Conjunctivae normal.  Cardiovascular:     Rate and Rhythm: Normal rate. Rhythm irregularly irregular.     Heart sounds: Normal heart sounds,  S1 normal and S2 normal.  Pulmonary:     Breath sounds: No decreased breath sounds, wheezing, rhonchi or rales.  Abdominal:     Palpations: Abdomen is soft.     Tenderness: There is no abdominal tenderness.  Musculoskeletal:     Right lower leg: No swelling.     Left lower leg: No swelling.  Skin:    General: Skin is warm.     Findings: No rash.  Neurological:     Mental Status: He is alert.     Comments: Able to straight leg raise bilateral legs.  Able to lift right arm up off the bed.  With previous left humerus fracture unable to lift left arm up.     Data Reviewed: CT scans and MRI reviewed Sodium 133, creatinine 1.12 with a GFR greater than 60 Hemoglobin 8.8, white blood count 4.8, platelet count 233 Echocardiogram shows normal EF.  Family Communication: Spoke with [name] on the phone  Disposition: Status is: Inpatient Remains inpatient appropriate because: Will transfer out of ICU today.  PT and OT consultations.  Planned Discharge Destination: Skilled nursing facility    Time spent: 29 minutes Case discussed with neurosurgery and neurology.  Author: Charlie Patterson, MD 11/16/2023 12:45 PM  For on call review [email or web address].

## 2023-11-16 NOTE — Inpatient Diabetes Management (Signed)
 Inpatient Diabetes Program Recommendations  AACE/ADA: New Consensus Statement on Inpatient Glycemic Control (2015)  Target Ranges:  Prepandial:   less than 140 mg/dL      Peak postprandial:   less than 180 mg/dL (1-2 hours)      Critically ill patients:  140 - 180 mg/dL   Lab Results  Component Value Date   GLUCAP 325 (H) 11/16/2023   HGBA1C 10.0 (H) 07/21/2023    Review of Glycemic Control  Diabetes history: DM 1 Outpatient Diabetes medications:  Insulin  pump at home (T-slim) Basal rate 12am 0.25 units/hr (24hr = 8 units) Bolus settings ISF 60 I:C ratio 20 Target 120  Off pump regimen: Novolog  1-9 units tid with meals  Tresiba  5 units daily Current orders for Inpatient glycemic control:  Novolog  0-9 units tid with meals and HS  Inpatient Diabetes Program Recommendations:   Follows with Dr Damian with last appointment 10/25/23.  Patient has type 1 diabetes and has not received basal insulin  while off of his insulin  pump. Please consider: -Semglee  5 units now and daily -Novolog  0-6 units q 4 hrs.  CBG 325 this am and received Novolog  7 units correction. Anticipate hypoglycemia.  Thank you, Maureen Duesing E. Ailsa Mireles, RN, MSN, CDCES  Diabetes Coordinator Inpatient Glycemic Control Team Team Pager [phone] (8am-5pm) 11/16/2023 10:47 AM

## 2023-11-16 NOTE — Assessment & Plan Note (Addendum)
 As needed risperidol for agitation. Seroquel  at night. - Discontinuing one-to-one sitter and see if he can be managed with telemetry sitter only - Palliative care was also consulted but patient would like to stay full code and full scope of care

## 2023-11-17 ENCOUNTER — Inpatient Hospital Stay

## 2023-11-17 DIAGNOSIS — W19XXXA Unspecified fall, initial encounter: Secondary | ICD-10-CM | POA: Diagnosis not present

## 2023-11-17 DIAGNOSIS — I615 Nontraumatic intracerebral hemorrhage, intraventricular: Secondary | ICD-10-CM | POA: Diagnosis not present

## 2023-11-17 DIAGNOSIS — D689 Coagulation defect, unspecified: Secondary | ICD-10-CM | POA: Diagnosis not present

## 2023-11-17 DIAGNOSIS — S0636AA Traumatic hemorrhage of cerebrum, unspecified, with loss of consciousness status unknown, initial encounter: Secondary | ICD-10-CM | POA: Diagnosis not present

## 2023-11-17 LAB — GLUCOSE, CAPILLARY
Glucose-Capillary: 213 mg/dL — ABNORMAL HIGH (ref 70–99)
Glucose-Capillary: 148 mg/dL — ABNORMAL HIGH (ref 70–99)
Glucose-Capillary: 200 mg/dL — ABNORMAL HIGH (ref 70–99)
Glucose-Capillary: 154 mg/dL — ABNORMAL HIGH (ref 70–99)

## 2023-11-17 LAB — BASIC METABOLIC PANEL WITH GFR
Anion gap: 8 (ref 5–15)
BUN: 27 mg/dL — ABNORMAL HIGH (ref 8–23)
CO2: 25 mmol/L (ref 22–32)
Calcium: 8.3 mg/dL — ABNORMAL LOW (ref 8.9–10.3)
Chloride: 102 mmol/L (ref 98–111)
Creatinine, Ser: 1.13 mg/dL (ref 0.61–1.24)
GFR, Estimated: >60 mL/min (ref >60)
Glucose, Bld: 230 mg/dL — ABNORMAL HIGH (ref 70–99)
Potassium: 4.2 mmol/L (ref 3.5–5.1)
Sodium: 135 mmol/L (ref 135–145)

## 2023-11-17 LAB — CBC
HCT: 27.3 % — ABNORMAL LOW (ref 39.0–52.0)
Hemoglobin: 9 g/dL — ABNORMAL LOW (ref 13.0–17.0)
MCH: 31.7 pg (ref 26.0–34.0)
MCHC: 33 g/dL (ref 30.0–36.0)
MCV: 96.1 fL (ref 80.0–100.0)
Platelets: 248 K/uL (ref 150–400)
RBC: 2.84 MIL/uL — ABNORMAL LOW (ref 4.22–5.81)
RDW: 17.2 % — ABNORMAL HIGH (ref 11.5–15.5)
WBC: 4.1 K/uL (ref 4.0–10.5)
nRBC: 0 % (ref 0.0–0.2)

## 2023-11-17 MED ORDER — LACTATED RINGERS IV SOLN: INTRAVENOUS | Status: DC

## 2023-11-17 MED ORDER — INSULIN GLARGINE 100 UNIT/ML ~~LOC~~ SOLN
6.0000 [IU] | Freq: Every day | SUBCUTANEOUS | Status: DC
  Administered 2023-11-18 – 2023-11-24 (×7): 6 [IU] via SUBCUTANEOUS
  Filled 2023-11-17 (×8): qty 0.06

## 2023-11-17 NOTE — Progress Notes (Addendum)
 Progress Note   Patient: Nicholas Weiss FMW:[id number] DOB: [birth year] DOA: 11/15/2023     1 DOS: the patient was seen and examined on 11/17/2023   Brief hospital course: 73 y.o. male with medical history significant for type I DM, HLD, recent history of fall with subdural hematoma in June 2025, chronic orthostatic hypotension, A-fib on Eliquis , seizure disorder, mild cognitive impairment, EtOH use who was brought in from home after he lost consciousness, fell on the ground.  Patient's wife reported that he did not hit his head.  Patient was confused and not able to provide meaningful history.  Patient's wife provided the history that she heard him fall, stepped out of the room and found him lying down with facedown forward.  Patient was unconscious about 5 to 10 minutes.  No convulsions.  She also stated that he has a frequent falls.  No history of recent infections, cough cold, fever, sick contact.  Patient normally ambulates with a walker at home.  He is confused at baseline at home. Patient was recently admitted to the hospital between 11/11/2023 to 11/13/2023 after presenting a fall.  As noted above he had a similar incident with the fall in June 2025 and had a subdural hematoma and a fractured left upper extremity.  He also had insulin  pump malfunction and presented with nonketotic hyperosmolar state in August 2025.   ED Course: Upon arrival to the ED, patient is found to have focus of intraventricular right lateral ventricle and occipital horn of right ventricle on CT scan of the brain.  Neurosurgical team was contacted and they will come and see the patient.  As per emergency room provider they advised that patient does not need transfer and we can keep him here.  Hospitalist service was consulted for evaluation for admission for neurosurgical evaluation and monitoring.   8/20.  Patient able to answer some simple questions and follow some simple commands and lift his right arm and straight leg  raise up off the bed.  History of left humerus fracture.  Admitted with intraventricular hemorrhage.  Eliquis  on hold  Assessment and Plan:   Intraventricular hemorrhage [facility]) Appreciate neurosurgical consultation recommended conservative management.   Appreciate neurological consultation.  MRI does not show any acute stroke CTA calcified atherosclerosis at carotid bifurcations approximately 50% stenosis of proximal cervical ICAs.  Severe stenosis right vertebral artery origin. Repeat CT 08/20 unchanged focus of hemorrhage within body of right lateral ventricle, Layering hemorrhage within the occipital horns of both lateral ventricles, slightly increased Holding Eliquis .  PT and OT consultations.  Continue to monitor. -poor po intake, IV fluids for hydration -Follow-up with outpatient neurology and neurosurgery in 2 weeks with repeat CT -Outpatient neuropsychological testing   Syncope and collapse, recurrent History of orthostatic hypotension.  Continue midodrine  with holding parameters if blood pressure greater than 120 for now.  Once he starts moving around with physical therapy likely will have to give prior to ambulation   Uncontrolled type 1 diabetes mellitus with hyperglycemia, with long-term current use of insulin  (HCC) Uses insulin  pump at home. will do Semglee  6u, insulin  plus sliding scale.   Hyponatremia Sodium 135   Seizure disorder (HCC) Continue Keppra  and gabapentin    Anemia of chronic disease Last hemoglobin 9.0   Left supracondylar humerus fracture, sequela Seen on hospitalization 2 months ago   MCI (mild cognitive impairment) with memory loss As needed risperidol for agitation. Seroquel  at night   Subjective: Patient drowsy and minimally responsive, new to me today. CT  repeat stable, seen by Neuro.  Able to straight leg raise bilaterally.  Able to lift right arm up off the bed.  History of previous left humerus fracture not been able to do too much with the left  arm.  Admitted with intraventricular hemorrhage. Called partner Wolm, provided updates and answered questions   Physical Exam: Vitals:   11/16/23 1856 11/16/23 2009 11/17/23 0519 11/17/23 0801  BP: (!) 105/59 (!) 141/78 (!) 143/97 135/86  Pulse: (!) 43 (!) 52 80 77  Resp: 18 18 16 16   Temp: (!) 97.5 F (36.4 C) (!) 97.3 F (36.3 C) (!) 97.5 F (36.4 C) 97.6 F (36.4 C)  TempSrc:    Oral  SpO2: 95% 99% 100% 100%  Weight:      Height:       Cardiovascular:     Rate and Rhythm: Normal rate. Rhythm irregularly irregular.     Heart sounds: Normal heart sounds, S1 normal and S2 normal.  Pulmonary:     Breath sounds: No decreased breath sounds, wheezing, rhonchi or rales.  Abdominal:     Palpations: Abdomen is soft.     Tenderness: There is no abdominal tenderness.  Musculoskeletal:     Right lower leg: No swelling.     Left lower leg: No swelling.  Skin:    General: Skin is warm.     Findings: No rash.  Neurological:     Mental Status: He is alert.     Comments: Able to straight leg raise bilateral legs.  Able to lift right arm up off the bed.  With previous left humerus fracture unable to lift left arm up   Family Communication: Spoke with [name] on the phone  Disposition: Status is: Inpatient Remains inpatient appropriate because  Planned Discharge Destination: Skilled nursing facility    Time spent: 349 minutes Case discussed with neurology.  Author: Laree Lock, MD 11/17/2023 9:00 AM  For on call review [email or web address].

## 2023-11-17 NOTE — Progress Notes (Signed)
 Patient sent for procedure via bed in stable condition.

## 2023-11-17 NOTE — Progress Notes (Signed)
 Patient received from procedure via bed in stable condition.

## 2023-11-17 NOTE — Progress Notes (Signed)
 Patient is only response to pain and not following any commands. Not able to perform his NIHSS and give his oral meds. MD made aware.

## 2023-11-17 NOTE — Evaluation (Signed)
 Physical Therapy Evaluation Patient Details Name: Nicholas Weiss MRN: [id number] DOB: Sep 23, 1950 Today's Date: 11/17/2023  History of Present Illness  73 y.o. male present to hospitalafter he lost consciousness and fell on the ground.  Patient was recently admitted to the hospital between 11/11/2023 to 11/13/2023 after presenting a fall.  As noted above he had a similar incident with the fall in June 2025 and had a subdural hematoma and a fractured left upper extremity.  Clinical Impression  Pt is very lethargic throughout PT session. According to information from recent admission, PLOF was mod I with mobility. PT used [facility] for mobility. He reports that his wife helped with ADLs.  Pt requires max A for bed mobility with tactile cues for hand placement and for initiation. PT attempted to guide pt through gross strength assessment in bed, however pt unable to participate d/t cognitive impairment/lethargy. PT provided max A for SLR as pt was unable to perform this movement. Pt demonstrates deficits in strength and ROM. Would benefit from skilled PT to address above deficits and promote optimal return to PLOF.       If plan is discharge home, recommend the following: A lot of help with walking and/or transfers;A lot of help with bathing/dressing/bathroom;Assistance with cooking/housework;Assist for transportation;Help with stairs or ramp for entrance   Can travel by private vehicle   No    Equipment Recommendations None recommended by PT  Recommendations for Other Services       Functional Status Assessment Patient has had a recent decline in their functional status and demonstrates the ability to make significant improvements in function in a reasonable and predictable amount of time.     Precautions / Restrictions Precautions Precautions: Fall Recall of Precautions/Restrictions: Impaired Precaution/Restrictions Comments: Assumed NWB on LUE d/t L humerus fx. Restrictions Weight Bearing  Restrictions Per Provider Order: No      Mobility  Bed Mobility Overal bed mobility: Needs Assistance Bed Mobility: Rolling Rolling: Max assist         General bed mobility comments: Rolled to R side. Verbal cuing for hand positioning. Patient unable to initiate movement. Rolling to L side not tested. possible NWB precautions d/t L humerus fx.    Transfers Overall transfer level: Needs assistance                 General transfer comment: unable to attempt secondary to lethargy    Ambulation/Gait               General Gait Details: Unable to attempt d/t lethargy  Stairs            Wheelchair Mobility     Tilt Bed    Modified Rankin (Stroke Patients Only)       Balance Overall balance assessment: Needs assistance     Sitting balance - Comments: Unable to attempt d/t lethargy       Standing balance comment: Unable to attempt d/t lethargy                             Pertinent Vitals/Pain Pain Assessment Pain Assessment: No/denies pain    Home Living Family/patient expects to be discharged to:: Private residence Living Arrangements: Spouse/significant other;Children Available Help at Discharge: Family;Available 24 hours/day Type of Home: House Home Access: Stairs to enter Entrance Stairs-Rails: Can reach both Entrance Stairs-Number of Steps: 1   Home Layout: One level Home Equipment: Cane - quad;Shower seat;Grab bars - toilet;Grab bars -  tub/shower;Cane - single Librarian, academic (2 wheels) Additional Comments: uses SPC in RUE for gait. History obtained from prior admission.    Prior Function Prior Level of Function : Needs assist             Mobility Comments: When shown RW in the room pt reported use of this for ambulation, however pt is very lethargic throughout evaluation and has history of cognitive impairments. ADLs Comments: reports assistance for ADLs from his wife.     Extremity/Trunk Assessment    Upper Extremity Assessment Upper Extremity Assessment: Generalized weakness LUE Deficits / Details: Mittens on BUEs. Pt presents in bed with elbows flexed. Pt able to extend elbows with assistance however pt resists full extension.    Lower Extremity Assessment Lower Extremity Assessment: Difficult to assess due to impaired cognition    Cervical / Trunk Assessment Cervical / Trunk Assessment: Kyphotic  Communication   Communication Communication: Impaired    Cognition Arousal: Lethargic, Suspect due to medications Behavior During Therapy: Restless   PT - Cognitive impairments: No family/caregiver present to determine baseline, History of cognitive impairments                       PT - Cognition Comments: Pt very lethargic throughout session. Will answer yes or no to questions, however pt is poor historian d/t cognitive impairments. Following commands: Impaired Following commands impaired: Follows one step commands inconsistently     Cueing Cueing Techniques: Verbal cues, Tactile cues, Gestural cues     General Comments      Exercises Other Exercises Other Exercises: Attempt for SLR and heel slides for gross LE assessment, however pt unable to perform d/t lethargy.   Assessment/Plan    PT Assessment Patient needs continued PT services  PT Problem List Decreased strength;Decreased knowledge of use of DME;Decreased knowledge of precautions;Decreased mobility;Decreased balance;Decreased activity tolerance;Decreased safety awareness       PT Treatment Interventions DME instruction;Gait training;Functional mobility training;Therapeutic activities;Therapeutic exercise;Balance training;Neuromuscular re-education;Patient/family education;Stair training    PT Goals (Current goals can be found in the Care Plan section)  Acute Rehab PT Goals Patient Stated Goal: Pt unable to participate in goal formation d/t lethargy. PT Goal Formulation: Patient unable to  participate in goal setting Time For Goal Achievement: 12/01/23 Potential to Achieve Goals: Fair    Frequency Min 2X/week     Co-evaluation               AM-PAC PT 6 Clicks Mobility  Outcome Measure Help needed turning from your back to your side while in a flat bed without using bedrails?: Total Help needed moving from lying on your back to sitting on the side of a flat bed without using bedrails?: A Lot Help needed moving to and from a bed to a chair (including a wheelchair)?: A Lot Help needed standing up from a chair using your arms (e.g., wheelchair or bedside chair)?: A Lot Help needed to walk in hospital room?: A Lot Help needed climbing 3-5 steps with a railing? : A Lot 6 Click Score: 11    End of Session   Activity Tolerance: Patient limited by lethargy Patient left: in bed;with nursing/sitter in room;with SCD's reapplied;with bed alarm set;with call bell/phone within reach Nurse Communication: Mobility status PT Visit Diagnosis: Unsteadiness on feet (R26.81);Other abnormalities of gait and mobility (R26.89);Difficulty in walking, not elsewhere classified (R26.2);Repeated falls (R29.6);Muscle weakness (generalized) (M62.81);History of falling (Z91.81)    Time: 8892-8880 PT Time Calculation (min) (ACUTE ONLY):  12 min   Charges:                 Lurene Robley, SPT   Iram Astorino 11/17/2023, 12:38 PM

## 2023-11-17 NOTE — Progress Notes (Signed)
 PT Cancellation Note  Patient Details Name: TIMMEY LAMBA MRN: [id number] DOB: Jan 07, 1951   Cancelled Treatment:    Reason Eval/Treat Not Completed: Other (comment). Consult received and chart reviewed. Pt currently out of room for imaging. Will re-attempt.   Willoughby Doell 11/17/2023, 10:23 AM Corean Dade, PT, DPT, GCS [phone]

## 2023-11-17 NOTE — Progress Notes (Addendum)
 NEUROLOGY CONSULT FOLLOW UP NOTE   Date of service: November 17, 2023 Patient Name: Nicholas Weiss MRN:  [id number] DOB:  1951-02-10  Interval Hx/subjective  Patient seen and examined this morning.  No family at bedside. CT angiography and MRI brain completed yesterday.  Imaging reviewed.  No evidence of underlying vascular malformation to explain the IVH.  Vitals   Vitals:   11/16/23 1856 11/16/23 2009 11/17/23 0519 11/17/23 0801  BP: (!) 105/59 (!) 141/78 (!) 143/97 135/86  Pulse: (!) 43 (!) 52 80 77  Resp: 18 18 16 16   Temp: (!) 97.5 F (36.4 C) (!) 97.3 F (36.3 C) (!) 97.5 F (36.4 C) 97.6 F (36.4 C)  TempSrc:    Oral  SpO2: 95% 99% 100% 100%  Weight:      Height:         Body mass index is 19.55 kg/m.  Physical Exam  Gen: WD WN sleeping in bed HEENT: [place] AT CVS: RRR Resp: CTABL Ext: warm, well perfused, no edema Neurological exam Sleeping in bed Opens eyes to voice Less interactive than last exam from yesterday Not very verbal - only some non comprehensible sounds CN: PERRL, gaze midline, does not blink to threat from either side, face grossly symmetric Motor: moves all ext to nox stim Sensory: as above Coordination can not be assessed given current mentation  Medications  Current Facility-Administered Medications:    acetaminophen  (TYLENOL ) tablet 650 mg, 650 mg, Oral, Q6H PRN **OR** acetaminophen  (TYLENOL ) suppository 650 mg, 650 mg, Rectal, Q6H PRN, Paudel, Keshab, MD   acyclovir  (ZOVIRAX ) 200 MG capsule 400 mg, 400 mg, Oral, Daily, Wieting, Richard, MD, 400 mg at 11/16/23 0957   atorvastatin  (LIPITOR ) tablet 80 mg, 80 mg, Oral, QHS, Paudel, Keshab, MD, 80 mg at 11/16/23 2159   Chlorhexidine  Gluconate Cloth 2 % PADS 6 each, 6 each, Topical, Daily, Paudel, Keshab, MD, 6 each at 11/16/23 0950   gabapentin  (NEURONTIN ) capsule 300 mg, 300 mg, Oral, BID, Wieting, Richard, MD, 300 mg at 11/16/23 2159   insulin  aspart (novoLOG ) injection 0-5 Units, 0-5 Units,  Subcutaneous, QHS, Wieting, Richard, MD   insulin  aspart (novoLOG ) injection 0-6 Units, 0-6 Units, Subcutaneous, TID WC, Wieting, Richard, MD, 2 Units at 11/17/23 0830   insulin  glargine (LANTUS ) injection 5 Units, 5 Units, Subcutaneous, Daily, Wieting, Richard, MD, 5 Units at 11/17/23 9094   latanoprost  (XALATAN ) 0.005 % ophthalmic solution 1 drop, 1 drop, Both Eyes, QHS, Paudel, Keshab, MD, 1 drop at 11/16/23 2200   levETIRAcetam  (KEPPRA ) tablet 500 mg, 500 mg, Oral, BID, Paudel, Keshab, MD, 500 mg at 11/16/23 2159   midodrine  (PROAMATINE ) tablet 2.5 mg, 2.5 mg, Oral, TID, Josette, Richard, MD, 2.5 mg at 11/17/23 9474   Oral care mouth rinse, 15 mL, Mouth Rinse, PRN, Paudel, Keshab, MD   oxyCODONE -acetaminophen  (PERCOCET/ROXICET) 5-325 MG per tablet 1 tablet, 1 tablet, Oral, Q6H PRN, Paudel, Keshab, MD, 1 tablet at 11/17/23 0025   polyethylene glycol (MIRALAX  / GLYCOLAX ) packet 17 g, 17 g, Oral, Daily PRN, Paudel, Keshab, MD   risperiDONE  (RISPERDAL ) tablet 0.5 mg, 0.5 mg, Oral, Q8H PRN, Josette, Richard, MD, 0.5 mg at 11/16/23 1305  Facility-Administered Medications Ordered in Other Encounters:    epoetin  alfa-epbx (RETACRIT ) injection 40,000 Units, 40,000 Units, Subcutaneous, Q21 days, Melanee Annah BROCKS, MD, 40,000 Units at 07/09/23 1153  Labs and Diagnostic Imaging   CBC:  Recent Labs  Lab 11/11/23 1523 11/15/23 0619 11/16/23 0307 11/17/23 0624  WBC 4.1   < > 4.8  4.1  NEUTROABS 2.7  --   --   --   HGB 9.7*   < > 8.8* 9.0*  HCT 30.5*   < > 27.0* 27.3*  MCV 100.0   < > 96.4 96.1  PLT 194   < > 233 248   < > = values in this interval not displayed.    Basic Metabolic Panel:  Lab Results  Component Value Date   NA 135 11/17/2023   K 4.2 11/17/2023   CO2 25 11/17/2023   GLUCOSE 230 (H) 11/17/2023   BUN 27 (H) 11/17/2023   CREATININE 1.13 11/17/2023   CALCIUM  8.3 (L) 11/17/2023   GFRNONAA >60 11/17/2023   GFRAA 54 (L) 03/15/2020   Lipid Panel:  Lab Results  Component Value  Date   LDLCALC 44 11/16/2023   HgbA1c:  Lab Results  Component Value Date   HGBA1C 10.0 (H) 07/21/2023   Urine Drug Screen:     Component Value Date/Time   LABOPIA NONE DETECTED 09/16/2023 1821   COCAINSCRNUR NONE DETECTED 09/16/2023 1821   LABBENZ NONE DETECTED 09/16/2023 1821   AMPHETMU NONE DETECTED 09/16/2023 1821   THCU NONE DETECTED 09/16/2023 1821   LABBARB NONE DETECTED 09/16/2023 1821    Alcohol Level     Component Value Date/Time   ETH <15 09/11/2023 1930   INR  Lab Results  Component Value Date   INR 1.3 (H) 11/16/2023   APTT  Lab Results  Component Value Date   APTT 111 (H) 08/16/2023   AED levels:  Lab Results  Component Value Date   LEVETIRACETA 31.5 08/26/2023    CT Head without contrast, CT C-spine upon arrival to ED morning of 11/15/2023 (Personally reviewed): Small volume acute IVH greater on the right and likely posttraumatic.  No associated ventriculomegaly.  No other acute intracranial abnormality or acute traumatic injury identified.  Degenerative changes in the cervical spine without evidence of an acute fracture or traumatic subluxation   Repeat CT head while in the hospital afternoon of 11/15/2023: Similar focus of IVH within the body of the right lateral ventricle.  Additional small focus of hemorrhage within the body of the left lateral ventricle-slightly decreased in size.  Small focus of hemorrhage in the right occipital horn is new from prior-probably redistribution of blood products.  CT angiography head and neck which included CT head 11/16/2023, around 3 PM: Mild increase in the IVH from prior.  No vascular malformation.  MR brain without contrast done around noon 11/16/2023-intraventricular blood products in the lateral ventricles, similar to recent CT.  No midline shift.  Mild to moderate chronic microvascular ischemic changes.   Assessment   Nicholas Weiss is a 73 y.o. male past history of type 1 diabetes, chronic orthostatic  hypotension, paroxysmal A-fib on Eliquis  prior to arrival, seizures on antiseizure medications, mild cognitive impairment without clear baseline-no family at bedside, a fall with subdural hematoma in June 2025 and another admission on 11/11/2023 for hyperosmolar nonketotic state as well as AKI on CKD, presented back to the emergency department on 11/15/2023 after a fall.  CT head revealed intraventricular hemorrhage.  Repeat imaging with CT head  x 2-second CT along with the CT angio head and neck showed a mild increase in the IVH.  No vascular malformation seen on the CT angiography.  MRI brain does not reveal any underlying ischemic infarct or other abnormalities. He seems to be much more lethargic today and somewhat difficult to arouse.  He had been given diazepam  for  imaging yesterday but I would not expect that to cause so much sedation. Repeat head CT to ensure that the hematoma has not expanded would be prudent.  Impression: Intraventricular hemorrhage-related to trauma in the setting of coagulopathy with DOAC at home  Recommendations  Repeat head CT Continue to hold Eliquis , antiplatelets anticoagulants. I would not resume his anticoagulation or antiplatelets upon discharge and let him follow-up closely with neurology and neurosurgery as previously recommended and then decide on whether it is safe to resume anticoagulation since he has had a subdural hematoma in June and an intraventricular bleed now in August related to his frequent falls. He needs outpatient neuropsychological testing for his cognitive impairment. I will follow the above imaging studies with you. Plan relayed to Dr. Jerelene ______________________________________________________________________   Signed, Eligio Lav, MD [facility]   Addendum The CT head completed and personally reviewed, also reviewed with the radiologist Stable focus of IVH in the body of the right lateral ventricle with mild increase in  the size of blood products in the occipital horns bilaterally-likely related to redistribution. Continue to monitor clinically closely

## 2023-11-17 NOTE — Plan of Care (Addendum)
 Patient is alert to self,  lethargic and drowsy since midday. He is able to follow simple commands.  Pt sitter at bedside, no any agitation noted . Denies any pain. Plan of care ongoing.     Problem: Education: Goal: Knowledge of disease or condition will improve Outcome: Progressing Goal: Knowledge of secondary prevention will improve (MUST DOCUMENT ALL) Outcome: Progressing Goal: Knowledge of patient specific risk factors will improve (DELETE if not current risk factor) Outcome: Progressing   Problem: Ischemic Stroke/TIA Tissue Perfusion: Goal: Complications of ischemic stroke/TIA will be minimized Outcome: Progressing   Problem: Health Behavior/Discharge Planning: Goal: Ability to manage health-related needs will improve Outcome: Progressing Goal: Goals will be collaboratively established with patient/family Outcome: Progressing   Problem: Self-Care: Goal: Ability to participate in self-care as condition permits will improve Outcome: Progressing Goal: Verbalization of feelings and concerns over difficulty with self-care will improve Outcome: Progressing Goal: Ability to communicate needs accurately will improve Outcome: Progressing   Problem: Education: Goal: Ability to describe self-care measures that may prevent or decrease complications (Diabetes Survival Skills Education) will improve Outcome: Progressing   Problem: Health Behavior/Discharge Planning: Goal: Ability to identify and utilize available resources and services will improve Outcome: Progressing Goal: Ability to manage health-related needs will improve Outcome: Progressing   Problem: Nutritional: Goal: Maintenance of adequate nutrition will improve Outcome: Progressing Goal: Progress toward achieving an optimal weight will improve Outcome: Progressing   Problem: Health Behavior/Discharge Planning: Goal: Ability to manage health-related needs will improve Outcome: Progressing

## 2023-11-17 NOTE — Progress Notes (Deleted)
 Progress Note   Patient: Nicholas Weiss FMW:[id number] DOB: [birth year] DOA: 11/15/2023     1 DOS: the patient was seen and examined on 11/17/2023   Brief hospital course: 73 y.o. male with medical history significant for type I DM, HLD, recent history of fall with subdural hematoma in June 2025, chronic orthostatic hypotension, A-fib on Eliquis , seizure disorder, mild cognitive impairment, EtOH use who was brought in from home after he lost consciousness, fell on the ground.  Patient's wife reported that he did not hit his head.  Patient was confused and not able to provide meaningful history.  Patient's wife provided the history that she heard him fall, stepped out of the room and found him lying down with facedown forward.  Patient was unconscious about 5 to 10 minutes.  No convulsions.  She also stated that he has a frequent falls.  No history of recent infections, cough cold, fever, sick contact.  Patient normally ambulates with a walker at home.  He is confused at baseline at home. Patient was recently admitted to the hospital between 11/11/2023 to 11/13/2023 after presenting a fall.  As noted above he had a similar incident with the fall in June 2025 and had a subdural hematoma and a fractured left upper extremity.  He also had insulin  pump malfunction and presented with nonketotic hyperosmolar state in August 2025.   ED Course: Upon arrival to the ED, patient is found to have focus of intraventricular right lateral ventricle and occipital horn of right ventricle on CT scan of the brain.  Neurosurgical team was contacted and they will come and see the patient.  As per emergency room provider they advised that patient does not need transfer and we can keep him here.  Hospitalist service was consulted for evaluation for admission for neurosurgical evaluation and monitoring.   8/20.  Patient able to answer some simple questions and follow some simple commands and lift his right arm and straight leg  raise up off the bed.  History of left humerus fracture.  Admitted with intraventricular hemorrhage.  Eliquis  on hold  Assessment and Plan:   Intraventricular hemorrhage [facility]) Appreciate neurosurgical consultation recommended conservative management.   Appreciate neurological consultation.  MRI does not show any acute stroke CTA calcified atherosclerosis at carotid bifurcations approximately 50% stenosis of proximal cervical ICAs.  Severe stenosis right vertebral artery origin. Repeat CT 08/20 unchanged focus of hemorrhage within body of right lateral ventricle, Layering hemorrhage within the occipital horns of both lateral ventricles, slightly increased Holding Eliquis .  PT and OT consultations.  Continue to monitor. -poor po intake, IV fluids for hydration -Follow-up with outpatient neurology and neurosurgery in 2 weeks with repeat CT -Outpatient neuropsychological testing   Syncope and collapse, recurrent History of orthostatic hypotension.  Continue midodrine  with holding parameters if blood pressure greater than 120 for now.  Once he starts moving around with physical therapy likely will have to give prior to ambulation   Uncontrolled type 1 diabetes mellitus with hyperglycemia, with long-term current use of insulin  (HCC) Uses insulin  pump at home.  will do Semglee  6u,  insulin  plus sliding scale.   Hyponatremia Sodium 135   Seizure disorder (HCC) Continue Keppra  and gabapentin    Anemia of chronic disease Last hemoglobin 9.0   Left supracondylar humerus fracture, sequela Seen on hospitalization 2 months ago   MCI (mild cognitive impairment) with memory loss As needed risperidol for agitation. Seroquel  at night   Subjective: Patient drowsy and minimally responsive, new to me  today. CT repeat stable, seen by Neuro.  Able to straight leg raise bilaterally.  Able to lift right arm up off the bed.  History of previous left humerus fracture not been able to do too much with the  left arm.  Admitted with intraventricular hemorrhage. Called partner Wolm, provided updates and answered questions   Physical Exam: Vitals:   11/17/23 0801 11/17/23 0900 11/17/23 1100 11/17/23 1500  BP: 135/86 130/80 (!) 144/92 (!) 152/82  Pulse: 77  77   Resp: 16  15 16   Temp: 97.6 F (36.4 C) 97.6 F (36.4 C) 97.6 F (36.4 C) (!) 97.1 F (36.2 C)  TempSrc: Oral  Axillary Axillary  SpO2: 100% 100% 100% 100%  Weight:      Height:       Cardiovascular:     Rate and Rhythm: Normal rate. Rhythm irregularly irregular.     Heart sounds: Normal heart sounds, S1 normal and S2 normal.  Pulmonary:     Breath sounds: No decreased breath sounds, wheezing, rhonchi or rales.  Abdominal:     Palpations: Abdomen is soft.     Tenderness: There is no abdominal tenderness.  Musculoskeletal:     Right lower leg: No swelling.     Left lower leg: No swelling.  Skin:    General: Skin is warm.     Findings: No rash.  Neurological:     Mental Status: He is alert.     Comments: Able to straight leg raise bilateral legs.  Able to lift right arm up off the bed.  With previous left humerus fracture unable to lift left arm up   Family Communication: Spoke with [name] on the phone  Disposition: Status is: Inpatient Remains inpatient appropriate because  Planned Discharge Destination: Skilled nursing facility    Time spent: 349 minutes Case discussed with neurology.  Author: Laree Lock, MD 11/17/2023 4:34 PM  For on call review [email or web address].

## 2023-11-18 DIAGNOSIS — I615 Nontraumatic intracerebral hemorrhage, intraventricular: Secondary | ICD-10-CM | POA: Diagnosis not present

## 2023-11-18 DIAGNOSIS — S0636AA Traumatic hemorrhage of cerebrum, unspecified, with loss of consciousness status unknown, initial encounter: Secondary | ICD-10-CM | POA: Diagnosis not present

## 2023-11-18 DIAGNOSIS — W19XXXA Unspecified fall, initial encounter: Secondary | ICD-10-CM | POA: Diagnosis not present

## 2023-11-18 DIAGNOSIS — D689 Coagulation defect, unspecified: Secondary | ICD-10-CM | POA: Diagnosis not present

## 2023-11-18 LAB — CBC
HCT: 27.9 % — ABNORMAL LOW (ref 39.0–52.0)
Hemoglobin: 9 g/dL — ABNORMAL LOW (ref 13.0–17.0)
MCH: 31.9 pg (ref 26.0–34.0)
MCHC: 32.3 g/dL (ref 30.0–36.0)
MCV: 98.9 fL (ref 80.0–100.0)
Platelets: 236 K/uL (ref 150–400)
RBC: 2.82 MIL/uL — ABNORMAL LOW (ref 4.22–5.81)
RDW: 17.2 % — ABNORMAL HIGH (ref 11.5–15.5)
WBC: 3.9 K/uL — ABNORMAL LOW (ref 4.0–10.5)
nRBC: 0 % (ref 0.0–0.2)

## 2023-11-18 LAB — BASIC METABOLIC PANEL WITH GFR
Anion gap: 11 (ref 5–15)
BUN: 23 mg/dL (ref 8–23)
CO2: 24 mmol/L (ref 22–32)
Calcium: 8.3 mg/dL — ABNORMAL LOW (ref 8.9–10.3)
Chloride: 104 mmol/L (ref 98–111)
Creatinine, Ser: 1.06 mg/dL (ref 0.61–1.24)
GFR, Estimated: >60 mL/min (ref >60)
Glucose, Bld: 111 mg/dL — ABNORMAL HIGH (ref 70–99)
Potassium: 4.2 mmol/L (ref 3.5–5.1)
Sodium: 139 mmol/L (ref 135–145)

## 2023-11-18 LAB — GLUCOSE, CAPILLARY
Glucose-Capillary: 117 mg/dL — ABNORMAL HIGH (ref 70–99)
Glucose-Capillary: 262 mg/dL — ABNORMAL HIGH (ref 70–99)
Glucose-Capillary: 199 mg/dL — ABNORMAL HIGH (ref 70–99)
Glucose-Capillary: 162 mg/dL — ABNORMAL HIGH (ref 70–99)

## 2023-11-18 NOTE — Plan of Care (Signed)
  Problem: Education: Goal: Knowledge of disease or condition will improve Outcome: Progressing Goal: Knowledge of secondary prevention will improve (MUST DOCUMENT ALL) Outcome: Progressing Goal: Knowledge of patient specific risk factors will improve (DELETE if not current risk factor) Outcome: Progressing   Problem: Ischemic Stroke/TIA Tissue Perfusion: Goal: Complications of ischemic stroke/TIA will be minimized Outcome: Progressing   Problem: Coping: Goal: Will verbalize positive feelings about self Outcome: Progressing Goal: Will identify appropriate support needs Outcome: Progressing   Problem: Self-Care: Goal: Ability to participate in self-care as condition permits will improve Outcome: Progressing Goal: Verbalization of feelings and concerns over difficulty with self-care will improve Outcome: Progressing Goal: Ability to communicate needs accurately will improve Outcome: Progressing   Problem: Nutrition: Goal: Risk of aspiration will decrease Outcome: Progressing Goal: Dietary intake will improve Outcome: Progressing   Problem: Fluid Volume: Goal: Ability to maintain a balanced intake and output will improve Outcome: Progressing   Problem: Health Behavior/Discharge Planning: Goal: Ability to identify and utilize available resources and services will improve Outcome: Progressing Goal: Ability to manage health-related needs will improve Outcome: Progressing   Problem: Skin Integrity: Goal: Risk for impaired skin integrity will decrease Outcome: Progressing   Problem: Clinical Measurements: Goal: Ability to maintain clinical measurements within normal limits will improve Outcome: Progressing Goal: Will remain free from infection Outcome: Progressing Goal: Diagnostic test results will improve Outcome: Progressing Goal: Respiratory complications will improve Outcome: Progressing Goal: Cardiovascular complication will be avoided Outcome: Progressing    Problem: Activity: Goal: Risk for activity intolerance will decrease Outcome: Progressing

## 2023-11-18 NOTE — Progress Notes (Signed)
 Neurosurgery Progress Note  History: ADEL BURCH is here for falls and intraventricular hemorrhage.  HD4: More awake, interactive  Physical Exam: Vitals:   11/18/23 1228 11/18/23 1608  BP: (!) 141/90 125/85  Pulse: 82 79  Resp: 16 16  Temp: 97.9 F (36.6 C) 97.8 F (36.6 C)  SpO2: 100% 100%   Awake, OE spontaneously, O x self and hospital CNI  MAE but limited LUE due to pain  Data:  Other tests/results: CTA reviewed - agree with findings MRI reviewed   Assessment/Plan:  ELIZER BOSTIC is here for IVH with syncope  - mobilize - no further imaging needed from NSGY standpoint unless he has a change in symptoms - DVT prophylaxis ok to start - PTOT    Reeves Daisy MD, [facility] Department of Neurosurgery

## 2023-11-18 NOTE — Progress Notes (Signed)
 Occupational Therapy Treatment Patient Details Name: Nicholas Weiss MRN: [id number] DOB: Mar 12, 1951 Today's Date: 11/18/2023   History of present illness 73 y.o. male present to hospitalafter he lost consciousness and fell on the ground.  Patient was recently admitted to the hospital between 11/11/2023 to 11/13/2023 after presenting a fall.  As noted above he had a similar incident with the fall in June 2025 and had a subdural hematoma and a fractured left upper extremity.   OT comments  Pt is supine in bed on arrival with covers pulled over his head. Easily arousable and agreeable to OT session. He denies pain. Orthostatic vitals were monitored during session and were positive, no TED hose or abdominal binder in room so reached out to MD and nurse to get those ordered. Pt performed supine to sit at EOB with Min/Mod A for trunkal assist d/t LUE fx. Able to sit at EOB with RUE support and SBA until he fatigued. BP with large drop and pt admitted to mild dizziness. X1 STS performed with Min/Mod A from elevated bed height and HHA on RUE only, however d/t pt's cognition and BP unable to progress to taking lateral steps to [facility] or to recliner. He requested to return to bed, required Max A to safely return to supine and reposition to [facility]. BP returned to 116/74 with return to bed. Sitter present. He was left with all needs in place and will cont to require skilled acute OT services to maximize his safety and IND to return to PLOF.       If plan is discharge home, recommend the following:  A lot of help with bathing/dressing/bathroom;Assistance with cooking/housework;Assistance with feeding;Direct supervision/assist for medications management;Direct supervision/assist for financial management;Assist for transportation;Help with stairs or ramp for entrance;A lot of help with walking and/or transfers   Equipment Recommendations  Other (comment) (defer to next venue)    Recommendations for Other Services       Precautions / Restrictions Precautions Precautions: Fall Recall of Precautions/Restrictions: Impaired Precaution/Restrictions Comments: Assumed NWB on LUE d/t L humerus fx-grimaces in pain to touch; orthostatic Required Braces or Orthoses: Sling Restrictions Weight Bearing Restrictions Per Provider Order: No       Mobility Bed Mobility Overal bed mobility: Needs Assistance Bed Mobility: Supine to Sit, Sit to Supine     Supine to sit: Mod assist, HOB elevated Sit to supine: Max assist   General bed mobility comments: mod A for trunkal elevation to reach EOB, able to maneuver legs well this date; required Max A to safely return to supine and scoot to [facility]    Transfers Overall transfer level: Needs assistance Equipment used: 1 person hand held assist Transfers: Sit to/from Stand Sit to Stand: Min assist, Mod assist, From elevated surface           General transfer comment: increased bed height and pt able to stand with HHA on R side d/t L sided fx, unable to take lateral steps to [facility] d/t orthostatic hypotension and cognition     Balance Overall balance assessment: Needs assistance Sitting-balance support: Feet supported, Single extremity supported Sitting balance-Leahy Scale: Fair     Standing balance support: Single extremity supported Standing balance-Leahy Scale: Poor Standing balance comment: x1 HHA on R side with Min/Mod A                           ADL either performed or assessed with clinical judgement   ADL Overall ADL's :  At baseline                                       General ADL Comments: has needed assist from significant other for ADL performance, anticipate will continue to need assist d/t orthostatic hypotension issues and L humeral fx in June    Extremity/Trunk Assessment              Vision       Perception     Praxis     Communication Communication Communication: Impaired   Cognition Arousal:  Lethargic, Alert Behavior During Therapy: Restless Cognition: History of cognitive impairments                               Following commands: Impaired Following commands impaired: Follows one step commands inconsistently      Cueing   Cueing Techniques: Verbal cues, Tactile cues, Gestural cues  Exercises      Shoulder Instructions       General Comments orthostatic hypotension noted during session    Pertinent Vitals/ Pain       Pain Assessment Pain Assessment: Faces Faces Pain Scale: Hurts little more Pain Location: L shoulder Pain Descriptors / Indicators: Aching Pain Intervention(s): Limited activity within patient's tolerance, Monitored during session, Repositioned  Home Living                                          Prior Functioning/Environment              Frequency  Min 2X/week        Progress Toward Goals  OT Goals(current goals can now be found in the care plan section)  Progress towards OT goals: Progressing toward goals  Acute Rehab OT Goals Patient Stated Goal: NA OT Goal Formulation: Patient unable to participate in goal setting Time For Goal Achievement: 11/30/23 Potential to Achieve Goals: Fair  Plan      Co-evaluation                 AM-PAC OT 6 Clicks Daily Activity     Outcome Measure   Help from another person eating meals?: A Little Help from another person taking care of personal grooming?: A Little Help from another person toileting, which includes using toliet, bedpan, or urinal?: A Lot Help from another person bathing (including washing, rinsing, drying)?: A Lot Help from another person to put on and taking off regular upper body clothing?: A Little Help from another person to put on and taking off regular lower body clothing?: A Lot 6 Click Score: 15    End of Session    OT Visit Diagnosis: Unsteadiness on feet (R26.81);Other symptoms and signs involving cognitive  function;Muscle weakness (generalized) (M62.81);History of falling (Z91.81);Repeated falls (R29.6)   Activity Tolerance Patient tolerated treatment well;Patient limited by fatigue   Patient Left with nursing/sitter in room;in bed;with call bell/phone within reach;with bed alarm set   Nurse Communication          Time: [phone] OT Time Calculation (min): 20 min  Charges: OT General Charges $OT Visit: 1 Visit OT Treatments $Therapeutic Activity: 8-22 mins  Devansh Riese, OTR/L  11/18/23, 10:36 AM   Duwaine FORBES Saupe 11/18/2023, 10:33 AM

## 2023-11-18 NOTE — Progress Notes (Signed)
 Speech Language Pathology Treatment: Dysphagia  Patient Details Name: Nicholas Weiss MRN: [id number] DOB: 1950-08-04 Today's Date: 11/18/2023 Time: [id number] SLP Time Calculation (min) (ACUTE ONLY): 13 min  Assessment / Plan / Recommendation Clinical Impression  Pt continues to present with s/sx of at least a moderate oral dysphagia c/b oral holding/prolonged oral prep with pureed textures. Pharyngeal swallow appeared [facility] with trials of puree and thin liquids. Solids deferred given severity of oral s/sx with puree as well as overall mental status which is likely the greatest barrier to a diet upgrade at this time. Recommend continuation of a puree diet with thin liquids and standard aspiration precautions as well as 1:1 assistance with feeding. SLP to f/u for clinical re-assessment of swallow function pending improvement in mental status. Current d/c recommendations remain appropriate.    HPI HPI: Pt is a 9 male who presented to ED today following syncopal episode. Pt recently admitted to hospital s/p fall. Pt with hx of recurrent falls, SDH, LUE fx, hyperosmolar nonketotic status, DMI with poor glycemic control, acute on chronic CKD, orthostatic hypotension, a-fb, sz, mult myeloma, HLC, MCI. Per chart review, cognitive deficits have been ongoing and confirmed with family.  Has follow-up with neurologist as per the family in the next few weeks for further workup. (Per D/C Summary on 11/13/23). Head CT, 11/15/23, 1. Similar focus of intraventricular hemorrhage within the body of the right  lateral ventricle.  2. Additional smaller focus of hemorrhage within the body of the left lateral  ventricle, slightly decreased in size.  3. Small focus of hemorrhage in the right occipital horn is new from prior,  possibly redistribution of blood products.      SLP Plan  Continue with current plan of care          Recommendations  Diet recommendations: Dysphagia 1 (puree);Thin liquid Liquids provided  via: Teaspoon;Cup;Straw Medication Administration:  (as tolerated) Supervision: Staff to assist with self feeding;Full supervision/cueing for compensatory strategies Compensations: Minimize environmental distractions;Slow rate;Small sips/bites;Follow solids with liquid                  Oral care QID;Staff/trained caregiver to provide oral care     Dysphagia, oral phase (R13.11)     Continue with current plan of care    Nicholas Weiss, M.S., CCC-SLP Speech-Language Pathologist [facility] [phone] (ASCOM)  Nicholas Weiss  11/18/2023, 8:45 AM

## 2023-11-18 NOTE — Progress Notes (Signed)
 Progress Note   Patient: NAVI ERBER FMW:[id number] DOB: [birth year] DOA: 11/15/2023     2 DOS: the patient was seen and examined on 11/18/2023   Brief hospital course: 73 y.o. male with medical history significant for type I DM, HLD, recent history of fall with subdural hematoma in June 2025, chronic orthostatic hypotension, A-fib on Eliquis , seizure disorder, mild cognitive impairment, EtOH use who was brought in from home after he lost consciousness, fell on the ground.  Patient's wife reported that he did not hit his head.  Patient was confused and not able to provide meaningful history.  Patient's wife provided the history that she heard him fall, stepped out of the room and found him lying down with facedown forward.  Patient was unconscious about 5 to 10 minutes.  No convulsions.  She also stated that he has a frequent falls.  No history of recent infections, cough cold, fever, sick contact.  Patient normally ambulates with a walker at home.  He is confused at baseline at home. Patient was recently admitted to the hospital between 11/11/2023 to 11/13/2023 after presenting a fall.  As noted above he had a similar incident with the fall in June 2025 and had a subdural hematoma and a fractured left upper extremity.  He also had insulin  pump malfunction and presented with nonketotic hyperosmolar state in August 2025.   ED Course: Upon arrival to the ED, patient is found to have focus of intraventricular right lateral ventricle and occipital horn of right ventricle on CT scan of the brain.  Neurosurgical team was contacted and they will come and see the patient.  As per emergency room provider they advised that patient does not need transfer and we can keep him here.  Hospitalist service was consulted for evaluation for admission for neurosurgical evaluation and monitoring.  Assessment and Plan:   Intraventricular hemorrhage [facility]) Appreciate neurosurgical consultation recommended conservative  management.   Appreciate neurological consultation.  MRI does not show any acute stroke CTA calcified atherosclerosis at carotid bifurcations approximately 50% stenosis of proximal cervical ICAs.  Severe stenosis right vertebral artery origin. Repeat CT 08/20 unchanged focus of hemorrhage within body of right lateral ventricle, Layering hemorrhage within the occipital horns of both lateral ventricles, slightly increased (likely related to layering/ redistribution) Holding Eliquis .  PT and OT consultations.  Continue to monitor. -poor po intake, IV fluids for hydration -Follow-up with outpatient neurology and neurosurgery in 2 weeks with repeat CT - SLP pureed diet   Syncope and collapse, recurrent History of orthostatic hypotension.   Continue midodrine  with holding parameters if blood pressure greater than 120 for now.  Once he starts moving around with physical therapy likely will have to give prior to ambulation Abdominal binder and TED hose   Uncontrolled type 1 diabetes mellitus with hyperglycemia, with long-term current use of insulin  (HCC) Uses insulin  pump at home. will do Semglee  6u, insulin  plus sliding scale.   Hyponatremia Sodium 135   Seizure disorder (HCC) Continue Keppra  and gabapentin    Anemia of chronic disease Last hemoglobin 9.0   Left supracondylar humerus fracture, sequela Seen on hospitalization 2 months ago   MCI (mild cognitive impairment) with memory loss As needed risperidol for agitation. Seroquel  at night -Outpatient neuropsychological testing   Subjective: Patient more awake and responsive today.  Had breakfast, sleeping at the time of my exam.  Participated with PT/OT Called partner Wolm, provided updates and answered questions 08/21   Physical Exam: Vitals:   11/18/23 0834 11/18/23  1031 11/18/23 1228 11/18/23 1608  BP: (!) 150/92 116/74 (!) 141/90 125/85  Pulse: 83  82 79  Resp: 16  16 16   Temp: 97.7 F (36.5 C)  97.9 F (36.6 C) 97.8 F  (36.6 C)  TempSrc:      SpO2: 100%  100% 100%  Weight:      Height:       Cardiovascular:     Rate and Rhythm: Normal rate. Rhythm irregularly irregular.     Heart sounds: Normal heart sounds, S1 normal and S2 normal.  Pulmonary:     Breath sounds: No decreased breath sounds, wheezing, rhonchi or rales.  Abdominal:     Palpations: Abdomen is soft.     Tenderness: There is no abdominal tenderness.  Musculoskeletal:     Right lower leg: No swelling.     Left lower leg: No swelling.  Skin:    General: Skin is warm.     Findings: No rash.  Neurological:     Mental Status: He is alert.     Comments: Able to straight leg raise bilateral legs.  Able to lift right arm up off the bed.  With previous left humerus fracture unable to lift left arm up   Family Communication: Spoke with [name] on the phone  Disposition: Status is: Inpatient Remains inpatient appropriate because  Planned Discharge Destination: Skilled nursing facility    Time spent: 39 minutes Case discussed with neurology  Author: Laree Lock, MD 11/18/2023 4:28 PM  For on call review [email or web address].

## 2023-11-18 NOTE — Progress Notes (Signed)
 NEUROLOGY CONSULT FOLLOW UP NOTE   Date of service: November 18, 2023 Patient Name: Nicholas Weiss MRN:  [id number] DOB:  15-Mar-1951  Interval Hx/subjective  Seen and examined More awake today  Vitals   Vitals:   11/18/23 0006 11/18/23 0403 11/18/23 0834 11/18/23 1031  BP: (!) 157/81 (!) 141/92 (!) 150/92 116/74  Pulse: 79 83 83   Resp: 18 17 16    Temp: 98.9 F (37.2 C) 98.5 F (36.9 C) 97.7 F (36.5 C)   TempSrc:  Oral    SpO2: 100% 100% 100%   Weight:      Height:         Body mass index is 19.55 kg/m.  Physical Exam  Gen: WD WN sleeping in bed HEENT: [place] AT CVS: RRR Resp: CTABL Ext: warm, well perfused, no edema Neurological exam He was in bed, sleeping. Awakens to voice Mildly dysarthric speech Follows commands Naming, repetition and comprehension intact Attention concentration is diminished Cranial nerves II to XII intact Motor examination with no drift in the right upper extremity, difficult to examine left upper extremity due to the fracture.  Wiggles toes to commands and lifts both lower extremities against gravity. Sensation intact   Medications  Current Facility-Administered Medications:    acetaminophen  (TYLENOL ) tablet 650 mg, 650 mg, Oral, Q6H PRN **OR** acetaminophen  (TYLENOL ) suppository 650 mg, 650 mg, Rectal, Q6H PRN, Paudel, Keshab, MD   acyclovir  (ZOVIRAX ) 200 MG capsule 400 mg, 400 mg, Oral, Daily, Wieting, Richard, MD, 400 mg at 11/18/23 9147   atorvastatin  (LIPITOR ) tablet 80 mg, 80 mg, Oral, QHS, Paudel, Keshab, MD, 80 mg at 11/17/23 2239   gabapentin  (NEURONTIN ) capsule 300 mg, 300 mg, Oral, BID, Wieting, Richard, MD, 300 mg at 11/18/23 9147   insulin  aspart (novoLOG ) injection 0-5 Units, 0-5 Units, Subcutaneous, QHS, Wieting, Richard, MD   insulin  aspart (novoLOG ) injection 0-6 Units, 0-6 Units, Subcutaneous, TID WC, Wieting, Richard, MD, 1 Units at 11/17/23 1655   insulin  glargine (LANTUS ) injection 6 Units, 6 Units, Subcutaneous,  Daily, Ponnala, Shruthi, MD, 6 Units at 11/18/23 9147   lactated ringers  infusion, , Intravenous, Continuous, Ponnala, Shruthi, MD, Last Rate: 50 mL/hr at 11/17/23 1741, New Bag at 11/17/23 1741   latanoprost  (XALATAN ) 0.005 % ophthalmic solution 1 drop, 1 drop, Both Eyes, QHS, Paudel, Keshab, MD, 1 drop at 11/17/23 2310   levETIRAcetam  (KEPPRA ) tablet 500 mg, 500 mg, Oral, BID, Paudel, Keshab, MD, 500 mg at 11/18/23 9147   midodrine  (PROAMATINE ) tablet 2.5 mg, 2.5 mg, Oral, TID, Josette, Richard, MD, 2.5 mg at 11/18/23 9354   Oral care mouth rinse, 15 mL, Mouth Rinse, PRN, Paudel, Nena, MD   oxyCODONE -acetaminophen  (PERCOCET/ROXICET) 5-325 MG per tablet 1 tablet, 1 tablet, Oral, Q6H PRN, Paudel, Keshab, MD, 1 tablet at 11/17/23 0025   polyethylene glycol (MIRALAX  / GLYCOLAX ) packet 17 g, 17 g, Oral, Daily PRN, Paudel, Keshab, MD   risperiDONE  (RISPERDAL ) tablet 0.5 mg, 0.5 mg, Oral, Q8H PRN, Wieting, Richard, MD, 0.5 mg at 11/17/23 1216  Facility-Administered Medications Ordered in Other Encounters:    epoetin  alfa-epbx (RETACRIT ) injection 40,000 Units, 40,000 Units, Subcutaneous, Q21 days, Melanee Annah BROCKS, MD, 40,000 Units at 07/09/23 1153  Labs and Diagnostic Imaging   CBC:  Recent Labs  Lab 11/11/23 1523 11/15/23 0619 11/17/23 0624 11/18/23 0402  WBC 4.1   < > 4.1 3.9*  NEUTROABS 2.7  --   --   --   HGB 9.7*   < > 9.0* 9.0*  HCT 30.5*   < >  27.3* 27.9*  MCV 100.0   < > 96.1 98.9  PLT 194   < > 248 236   < > = values in this interval not displayed.    Basic Metabolic Panel:  Lab Results  Component Value Date   NA 139 11/18/2023   K 4.2 11/18/2023   CO2 24 11/18/2023   GLUCOSE 111 (H) 11/18/2023   BUN 23 11/18/2023   CREATININE 1.06 11/18/2023   CALCIUM  8.3 (L) 11/18/2023   GFRNONAA >60 11/18/2023   GFRAA 54 (L) 03/15/2020   Lipid Panel:  Lab Results  Component Value Date   LDLCALC 44 11/16/2023   HgbA1c:  Lab Results  Component Value Date   HGBA1C 10.0 (H)  07/21/2023   Urine Drug Screen:     Component Value Date/Time   LABOPIA NONE DETECTED 09/16/2023 1821   COCAINSCRNUR NONE DETECTED 09/16/2023 1821   LABBENZ NONE DETECTED 09/16/2023 1821   AMPHETMU NONE DETECTED 09/16/2023 1821   THCU NONE DETECTED 09/16/2023 1821   LABBARB NONE DETECTED 09/16/2023 1821    Alcohol Level     Component Value Date/Time   ETH <15 09/11/2023 1930   INR  Lab Results  Component Value Date   INR 1.3 (H) 11/16/2023   APTT  Lab Results  Component Value Date   APTT 111 (H) 08/16/2023   AED levels:  Lab Results  Component Value Date   LEVETIRACETA 31.5 08/26/2023    CT Head without contrast, CT C-spine upon arrival to ED morning of 11/15/2023 (Personally reviewed): Small volume acute IVH greater on the right and likely posttraumatic.  No associated ventriculomegaly.  No other acute intracranial abnormality or acute traumatic injury identified.  Degenerative changes in the cervical spine without evidence of an acute fracture or traumatic subluxation   Repeat CT head while in the hospital afternoon of 11/15/2023: Similar focus of IVH within the body of the right lateral ventricle.  Additional small focus of hemorrhage within the body of the left lateral ventricle-slightly decreased in size.  Small focus of hemorrhage in the right occipital horn is new from prior-probably redistribution of blood products.  CT angiography head and neck which included CT head 11/16/2023, around 3 PM: Mild increase in the IVH from prior.  No vascular malformation.  MR brain without contrast done around noon 11/16/2023-intraventricular blood products in the lateral ventricles, similar to recent CT.  No midline shift.  Mild to moderate chronic microvascular ischemic changes.  CT head 11/17/2023-unchanged focus of hemorrhage within the body of the right lateral ventricle 2.3 x 1.2 cm.  Layering hemorrhage within the occipital horns of both lateral ventricles slightly increased.   Unchanged size and configuration of the ventricular system.  Moderate chronic small vessel disease   Assessment   Nicholas Weiss is a 73 y.o. male past history of type 1 diabetes, chronic orthostatic hypotension, paroxysmal A-fib on Eliquis  prior to arrival, seizures on antiseizure medications, mild cognitive impairment without clear baseline-no family at bedside, a fall with subdural hematoma in June 2025 and another admission on 11/11/2023 for hyperosmolar nonketotic state as well as AKI on CKD, presented back to the emergency department on 11/15/2023 after a fall.  CT head revealed intraventricular hemorrhage.  Repeat imaging with CT head  x 2-second CT along with the CT angio head and neck showed a mild increase in the IVH.  No vascular malformation seen on the CT angiography.  MRI brain does not reveal any underlying ischemic infarct or other abnormalities.  He was  more lethargic yesterday.  Head CT overall stable with maybe subtle worsening of the bleed in the ventricles, likely related to layering/redistribution.  Mentation yesterday could have been worse due to the sedation given for imaging prior day.  Exam today seems to be much improved. I do not see any further need for imaging unless his neurological exam changes.   Impression: Intraventricular hemorrhage-related to trauma in the setting of coagulopathy with DOAC at home  Recommendations  Continue to hold Eliquis , antiplatelets anticoagulants. I would not resume his anticoagulation or antiplatelets upon discharge and let him follow-up closely with neurology and neurosurgery as previously recommended and then decide on whether it is safe to resume anticoagulation since he has had a subdural hematoma in June and an intraventricular bleed now in August related to his frequent falls.  He needs outpatient neuropsychological testing for his cognitive impairment.  Outpatient neurology and neurosurgical follow-up.  Inpatient neurology will be  available as needed.  Please call with questions  Plan relayed to Dr. Jerelene   ______________________________________________________________________   Signed, Eligio Lav, MD [facility]

## 2023-11-19 ENCOUNTER — Inpatient Hospital Stay

## 2023-11-19 DIAGNOSIS — I615 Nontraumatic intracerebral hemorrhage, intraventricular: Secondary | ICD-10-CM | POA: Diagnosis not present

## 2023-11-19 LAB — BASIC METABOLIC PANEL WITH GFR
Anion gap: 11 (ref 5–15)
BUN: 20 mg/dL (ref 8–23)
CO2: 26 mmol/L (ref 22–32)
Calcium: 8.5 mg/dL — ABNORMAL LOW (ref 8.9–10.3)
Chloride: 100 mmol/L (ref 98–111)
Creatinine, Ser: 1.2 mg/dL (ref 0.61–1.24)
GFR, Estimated: >60 mL/min (ref >60)
Glucose, Bld: 172 mg/dL — ABNORMAL HIGH (ref 70–99)
Potassium: 4.1 mmol/L (ref 3.5–5.1)
Sodium: 137 mmol/L (ref 135–145)

## 2023-11-19 LAB — CBC
HCT: 28.8 % — ABNORMAL LOW (ref 39.0–52.0)
Hemoglobin: 9.4 g/dL — ABNORMAL LOW (ref 13.0–17.0)
MCH: 32 pg (ref 26.0–34.0)
MCHC: 32.6 g/dL (ref 30.0–36.0)
MCV: 98 fL (ref 80.0–100.0)
Platelets: 256 K/uL (ref 150–400)
RBC: 2.94 MIL/uL — ABNORMAL LOW (ref 4.22–5.81)
RDW: 17.3 % — ABNORMAL HIGH (ref 11.5–15.5)
WBC: 3.6 K/uL — ABNORMAL LOW (ref 4.0–10.5)
nRBC: 0 % (ref 0.0–0.2)

## 2023-11-19 LAB — BLOOD GAS, VENOUS: Patient temperature: 37

## 2023-11-19 LAB — GLUCOSE, CAPILLARY
Glucose-Capillary: 250 mg/dL — ABNORMAL HIGH (ref 70–99)
Glucose-Capillary: 330 mg/dL — ABNORMAL HIGH (ref 70–99)
Glucose-Capillary: 236 mg/dL — ABNORMAL HIGH (ref 70–99)
Glucose-Capillary: 192 mg/dL — ABNORMAL HIGH (ref 70–99)
Glucose-Capillary: 202 mg/dL — ABNORMAL HIGH (ref 70–99)

## 2023-11-19 LAB — AMMONIA: Ammonia: 16 umol/L (ref 9–35)

## 2023-11-19 MED ORDER — LACTATED RINGERS IV SOLN: INTRAVENOUS | Status: DC

## 2023-11-19 NOTE — Plan of Care (Signed)
  Problem: Education: Goal: Knowledge of disease or condition will improve 11/19/2023 1758 by Nelwyn Edsel HERO, RN Outcome: Progressing 11/19/2023 1517 by Nelwyn Edsel HERO, RN Outcome: Progressing Goal: Knowledge of secondary prevention will improve (MUST DOCUMENT ALL) 11/19/2023 1758 by Nelwyn Edsel HERO, RN Outcome: Progressing 11/19/2023 1517 by Nelwyn Edsel HERO, RN Outcome: Progressing Goal: Knowledge of patient specific risk factors will improve (DELETE if not current risk factor) 11/19/2023 1758 by Nelwyn Edsel HERO, RN Outcome: Progressing 11/19/2023 1517 by Nelwyn Edsel HERO, RN Outcome: Progressing   Problem: Ischemic Stroke/TIA Tissue Perfusion: Goal: Complications of ischemic stroke/TIA will be minimized 11/19/2023 1758 by Nelwyn Edsel HERO, RN Outcome: Progressing 11/19/2023 1517 by Nelwyn Edsel HERO, RN Outcome: Progressing   Problem: Coping: Goal: Will verbalize positive feelings about self Outcome: Progressing Goal: Will identify appropriate support needs Outcome: Progressing   Problem: Health Behavior/Discharge Planning: Goal: Ability to manage health-related needs will improve Outcome: Progressing Goal: Goals will be collaboratively established with patient/family Outcome: Progressing   Problem: Self-Care: Goal: Ability to participate in self-care as condition permits will improve Outcome: Progressing Goal: Verbalization of feelings and concerns over difficulty with self-care will improve Outcome: Progressing Goal: Ability to communicate needs accurately will improve Outcome: Progressing   Problem: Nutrition: Goal: Risk of aspiration will decrease Outcome: Progressing Goal: Dietary intake will improve Outcome: Progressing   Problem: Education: Goal: Ability to describe self-care measures that may prevent or decrease complications (Diabetes Survival Skills Education) will improve Outcome: Progressing   Problem: Coping: Goal: Ability to  adjust to condition or change in health will improve Outcome: Progressing   Problem: Fluid Volume: Goal: Ability to maintain a balanced intake and output will improve Outcome: Progressing   Problem: Health Behavior/Discharge Planning: Goal: Ability to identify and utilize available resources and services will improve Outcome: Progressing Goal: Ability to manage health-related needs will improve Outcome: Progressing   Problem: Metabolic: Goal: Ability to maintain appropriate glucose levels will improve Outcome: Progressing   Problem: Nutritional: Goal: Maintenance of adequate nutrition will improve Outcome: Progressing Goal: Progress toward achieving an optimal weight will improve Outcome: Progressing

## 2023-11-19 NOTE — TOC Progression Note (Addendum)
 Transition of Care [facility]) - Progression Note    Patient Details  Name: Nicholas Weiss MRN: [id number] Date of Birth: [birth year]  Transition of Care [facility]) CM/SW Contact  Dalia GORMAN Fuse, RN Phone Number: 11/19/2023, 3:28 PM  Clinical Narrative:     Therapy recommends SNF. TOC visited with the patient in his room and he didn't feel like talking.  TOC outreached to the patient's son [name] who advised me to reachout to the patient's partner, [name]. [name]'s first choice for SNF is  [facility] and she doesn't want him to go to [facility]. FL2 sent out in [place]                 Expected Discharge Plan and Services                                               Social Drivers of Health (SDOH) Interventions SDOH Screenings   Food Insecurity: No Food Insecurity (11/16/2023)  Housing: Unknown (11/16/2023)  Transportation Needs: No Transportation Needs (11/16/2023)  Utilities: Not At Risk (11/16/2023)  Alcohol Screen: Low Risk  (10/02/2020)  Depression (PHQ2-9): Low Risk  (11/10/2023)  Financial Resource Strain: Low Risk  (10/20/2023)   Received from [facility]  Physical Activity: Insufficiently Active (01/01/2020)  Social Connections: Moderately Isolated (11/16/2023)  Stress: No Stress Concern Present (01/01/2020)  Tobacco Use: Low Risk  (11/15/2023)  Recent Concern: Tobacco Use - Medium Risk (10/27/2023)   Received from [facility]    Readmission Risk Interventions    02/16/2023    3:56 PM 08/24/2022   10:57 AM 05/20/2022   11:41 AM  Readmission Risk Prevention Plan  Transportation Screening Complete Complete Complete  Medication Review Oceanographer) Complete Complete Complete  PCP or Specialist appointment within 3-5 days of discharge Complete Complete Complete  HRI or Home Care Consult Complete Complete Complete  SW Recovery Care/Counseling Consult Not Complete    Palliative Care Screening Not Applicable  Complete   Skilled Nursing Facility Complete Complete Complete

## 2023-11-19 NOTE — Progress Notes (Signed)
   11/19/23 1730  Spiritual Encounters  Type of Visit Attempt (pt unavailable)  Care provided to: Pt not available  Conversation partners present during encounter Nurse  Referral source Code page  Reason for visit Code  OnCall Visit Yes   Chaplain attempted to visit patient earlier due to a RRT page; however, staff was trying to get patient stable and no family was present. Chaplain came back to see how patient was doing but with Nurse at bedside, he appeared to be resting.  Chaplain Services remains available as needed/requested.  Rev. Rana M. Nicholaus, M.Div. Chaplain Resident [facility]

## 2023-11-19 NOTE — Significant Event (Addendum)
 Rapid response was called at 3.58 PM as patient was unresponsive/obtunded   On arrival patient laying in the bed, not responsive, grimaces to noxious stimuli, open eyes but not following commands  Vitals: BP 108/69, HR in 60s, sats 100% on room air.  Blood glucose 236 Neuro: not responsive, grimaces to noxious stimuli, not following commands, moving b/l lower extremities  Stat CT head pending BMP, CBC, VBG, ammonia  pending Called partner [name] and left voicemail

## 2023-11-19 NOTE — Progress Notes (Addendum)
 Progress Note   Patient: Nicholas Weiss FMW:[id number] DOB: [birth year] DOA: 11/15/2023     3 DOS: the patient was seen and examined on 11/19/2023   Brief hospital course: 73 y.o. male with medical history significant for type I DM, HLD, recent history of fall with subdural hematoma in June 2025, chronic orthostatic hypotension, A-fib on Eliquis , seizure disorder, mild cognitive impairment, EtOH use who was brought in from home after he lost consciousness, fell on the ground, has been having recurrent falls and confused at baseline.  Workup revealed intraventricular right lateral ventricle and occipital horn of right ventricle on CT scan of the brain, seen by neurosurgery recommended conservative management.  Neurology was consulted, MRI negative for acute stroke, recommend holding anticoagulation long-term due to recurrent falls and history of SDH and now with intraventricular hemorrhage.  Patient was found to be lethargic on 08/20, with CT stable  Rapid response was called on 08/22 as patient was found unresponsive, CT head pending  Assessment and Plan:   Intraventricular hemorrhage (HCC) -Appreciate neurosurgical consultation recommended conservative management.   -Appreciate neurological consultation.  MRI does not show any acute stroke -CTA calcified atherosclerosis at carotid bifurcations approximately 50% stenosis of proximal cervical ICAs.  Severe stenosis right vertebral artery origin. -Repeat CT 08/20 unchanged focus of hemorrhage within body of right lateral ventricle, Layering hemorrhage within the occipital horns of both lateral ventricles, slightly increased (likely related to layering/ redistribution) -Holding Eliquis .  PT/OT eval - Rapid response was called on 08/22 as patient was unresponsive, repeat CT head pending - poor po intake, IV fluids for hydration - Follow-up with outpatient neurology and neurosurgery in 2 weeks with repeat CT - SLP pureed diet  Addendum: Repeat CT  08/22 shows focal clot burden right lateral ventricle process and slightly smaller.  Some layering blood dependent in atria and occipital horns of the lateral ventricles, less by volume.  No new or worsening intracranial hemorrhage. Patient mental status improved, to baseline   Syncope and collapse, recurrent - History of orthostatic hypotension.   ontinue midodrine  with holding parameters if blood pressure greater than 120 for now.  Once he starts moving around with physical therapy likely will have to give prior to ambulation Abdominal binder and TED hose   Uncontrolled type 1 diabetes mellitus with hyperglycemia, with long-term current use of insulin  (HCC) Uses insulin  pump at home. will do Semglee  6u, insulin  plus sliding scale.   Hyponatremia Sodium 135   Seizure disorder (HCC) Continue Keppra  and gabapentin    Anemia of chronic disease Last hemoglobin 9.0   Left supracondylar humerus fracture, sequela Seen on hospitalization 2 months ago   MCI (mild cognitive impairment) with memory loss As needed risperidol for agitation. Seroquel  at night -Outpatient neuropsychological testing   Subjective: Patient is alert, more responsive this morning and was sitting in the chair.  Stated he had eggs for breakfast and he is at the hospital for unclear reasons. Later a rapid response was called at 4 PM due to unresponsiveness, CT head pending Called partner [name] and left voicemail   Physical Exam: Vitals:   11/19/23 1200 11/19/23 1548 11/19/23 1554 11/19/23 1619  BP: 139/76 94/60 108/70 136/82  Pulse: 66 70 83 79  Resp: 16   16  Temp: 98.4 F (36.9 C)   97.8 F (36.6 C)  TempSrc: Oral     SpO2: 100% 100% 100% 100%  Weight:      Height:       Cardiovascular:  Rate and Rhythm: Normal rate. Rhythm irregularly irregular.     Heart sounds: Normal heart sounds, S1 normal and S2 normal.  Pulmonary:     Breath sounds: No decreased breath sounds, wheezing, rhonchi or rales.   Abdominal:     Palpations: Abdomen is soft.     Tenderness: There is no abdominal tenderness.  Musculoskeletal:     Right lower leg: No swelling.     Left lower leg: No swelling.  Skin:    General: Skin is warm.     Findings: No rash.  Neurological:     Mental Status: unresponsive, grimaces to noxious stimuli, open eyes but not following commands    Family Communication: Spoke with [name] on the phone  Disposition: Status is: Inpatient Remains inpatient appropriate because  Planned Discharge Destination: Skilled nursing facility  Time spent: 39 minutes   Author: Laree Lock, MD 11/19/2023 4:32 PM  For on call review [email or web address].

## 2023-11-19 NOTE — Significant Event (Signed)
 Rapid Response Event Note   Reason for Call : called for change in mental staus   Initial Focused Assessment: laying in bed, vss- see flowsheet for details, grimaces to noxious stimuli, starting to open eyes.      Interventions: Dr Jerelene at bedside examining pt, ordered stat head CT, and will discuss with neuro.   Plan of Care: as above, RN Danielle to call for further assistance    Event Summary:   MD Notified: Ponnala 1550 Call Time:1550 Arrival Time:1552 End Time:1605  Brentt Fread A, RN

## 2023-11-19 NOTE — Progress Notes (Signed)
 Responded to overhead page of rapid response. On arrival to room, RN provider present. Pt has palpable pulse, able to maintain airway. Grimaces slightly to noxious stimulus. Blood sugar checked. Nerurology will be made aware, pt will go for head CT.

## 2023-11-19 NOTE — Care Management Important Message (Signed)
 Important Message  Patient Details  Name: Nicholas Weiss MRN: [id number] Date of Birth: 1951-02-23   Important Message Given:  Yes - Medicare IM     Thomasena Vandenheuvel W, CMA 11/19/2023, 2:33 PM

## 2023-11-19 NOTE — Plan of Care (Signed)
  Problem: Education: Goal: Knowledge of disease or condition will improve Outcome: Progressing Goal: Knowledge of secondary prevention will improve (MUST DOCUMENT ALL) Outcome: Progressing Goal: Knowledge of patient specific risk factors will improve (DELETE if not current risk factor) Outcome: Progressing   Problem: Ischemic Stroke/TIA Tissue Perfusion: Goal: Complications of ischemic stroke/TIA will be minimized Outcome: Progressing   Problem: Coping: Goal: Will verbalize positive feelings about self Outcome: Progressing Goal: Will identify appropriate support needs Outcome: Progressing   Problem: Health Behavior/Discharge Planning: Goal: Ability to manage health-related needs will improve Outcome: Progressing Goal: Goals will be collaboratively established with patient/family Outcome: Progressing   Problem: Self-Care: Goal: Ability to participate in self-care as condition permits will improve Outcome: Progressing Goal: Verbalization of feelings and concerns over difficulty with self-care will improve Outcome: Progressing Goal: Ability to communicate needs accurately will improve Outcome: Progressing   Problem: Nutrition: Goal: Risk of aspiration will decrease Outcome: Progressing Goal: Dietary intake will improve Outcome: Progressing   Problem: Education: Goal: Ability to describe self-care measures that may prevent or decrease complications (Diabetes Survival Skills Education) will improve Outcome: Progressing   Problem: Coping: Goal: Ability to adjust to condition or change in health will improve Outcome: Progressing   Problem: Fluid Volume: Goal: Ability to maintain a balanced intake and output will improve Outcome: Progressing   Problem: Health Behavior/Discharge Planning: Goal: Ability to identify and utilize available resources and services will improve Outcome: Progressing Goal: Ability to manage health-related needs will improve Outcome: Progressing    Problem: Metabolic: Goal: Ability to maintain appropriate glucose levels will improve Outcome: Progressing   Problem: Nutritional: Goal: Maintenance of adequate nutrition will improve Outcome: Progressing Goal: Progress toward achieving an optimal weight will improve Outcome: Progressing

## 2023-11-19 NOTE — Progress Notes (Signed)
 Physical Therapy Treatment Patient Details Name: Nicholas Weiss MRN: [id number] DOB: 04/11/1950 Today's Date: 11/19/2023   History of Present Illness 73 y.o. male present to hospitalafter he lost consciousness and fell on the ground.  Patient was recently admitted to the hospital between 11/11/2023 to 11/13/2023 after presenting a fall.  As noted above he had a similar incident with the fall in June 2025 and had a subdural hematoma and a fractured left upper extremity.    PT Comments  Pt in bed, ready to get up.  He is able to get to EOB with min a x t to get hips fully to EOB but overall does well.  Steady in sitting.  Stands with min/mod a x 1 from bed and remains standing for 3 minutes with min a x 1 for post LOB's and general safety.  Pt leans on bed for support with cues to correct posture.  He struggles to take steps to chair and then c/o weak LE's and is guided to chair by Clinical research associate.  Remained up in recliner for breakfast with safety on.  RN and tech aware.   Orthostatics taken during session.  PT 171/93 P 71 supine and drops to 104/77 P 87 at 3 minutes.  Full orthostatics in flow sheets.  He does not c/o any dizziness today. Knee high TED hose and ABD that were in room were donned and left on pt in chair.    If plan is discharge home, recommend the following: A lot of help with walking and/or transfers;A lot of help with bathing/dressing/bathroom;Assistance with cooking/housework;Assist for transportation;Help with stairs or ramp for entrance   Can travel by private vehicle        Equipment Recommendations       Recommendations for Other Services       Precautions / Restrictions Precautions Precautions: Fall Recall of Precautions/Restrictions: Impaired Precaution/Restrictions Comments: Assumed NWB on LUE d/t L humerus fx-grimaces in pain to touch; orthostatic Required Braces or Orthoses:  (ted hose, ABD binder)     Mobility  Bed Mobility Overal bed mobility: Needs  Assistance Bed Mobility: Supine to Sit     Supine to sit: Min assist     General bed mobility comments: has trouble getting hips to EOB but overall does well. Patient Response: Cooperative  Transfers Overall transfer level: Needs assistance Equipment used: Rolling walker (2 wheels) Transfers: Sit to/from Stand Sit to Stand: Min assist, Mod assist, From elevated surface                Ambulation/Gait               General Gait Details: has trouble stepping to chair and needs assist from writer to guide him safely   Stairs             Wheelchair Mobility     Tilt Bed Tilt Bed Patient Response: Cooperative  Modified Rankin (Stroke Patients Only)       Balance Overall balance assessment: Needs assistance Sitting-balance support: Feet supported, Single extremity supported Sitting balance-Leahy Scale: Fair     Standing balance support: Single extremity supported Standing balance-Leahy Scale: Poor Standing balance comment: post lean                            Communication Communication Communication: Impaired Factors Affecting Communication: Hearing impaired  Cognition   Behavior During Therapy: WFL for tasks assessed/performed   PT - Cognitive impairments: No family/caregiver present to  determine baseline, History of cognitive impairments                       PT - Cognition Comments: alert today but slow to answer at times Following commands: Impaired Following commands impaired: Follows one step commands inconsistently    Cueing Cueing Techniques: Verbal cues, Tactile cues, Gestural cues  Exercises      General Comments        Pertinent Vitals/Pain Pain Assessment Pain Assessment: Faces Faces Pain Scale: Hurts little more Pain Location: L shoulder Pain Descriptors / Indicators: Sore Pain Intervention(s): Limited activity within patient's tolerance, Monitored during session, Repositioned    Home Living                           Prior Function            PT Goals (current goals can now be found in the care plan section) Progress towards PT goals: Progressing toward goals    Frequency    Min 2X/week      PT Plan      Co-evaluation              AM-PAC PT 6 Clicks Mobility   Outcome Measure  Help needed turning from your back to your side while in a flat bed without using bedrails?: A Little Help needed moving from lying on your back to sitting on the side of a flat bed without using bedrails?: A Little Help needed moving to and from a bed to a chair (including a wheelchair)?: A Lot Help needed standing up from a chair using your arms (e.g., wheelchair or bedside chair)?: A Lot Help needed to walk in hospital room?: A Lot Help needed climbing 3-5 steps with a railing? : Total 6 Click Score: 13    End of Session Equipment Utilized During Treatment: Gait belt (abd and ted hose) Activity Tolerance: Patient tolerated treatment well Patient left: in chair;with chair alarm set;with call bell/phone within reach Nurse Communication: Mobility status PT Visit Diagnosis: Unsteadiness on feet (R26.81);Other abnormalities of gait and mobility (R26.89);Difficulty in walking, not elsewhere classified (R26.2);Repeated falls (R29.6);Muscle weakness (generalized) (M62.81);History of falling (Z91.81)     Time: 9090-9068 PT Time Calculation (min) (ACUTE ONLY): 22 min  Charges:    $Therapeutic Activity: 8-22 mins PT General Charges $$ ACUTE PT VISIT: 1 Visit         Lauraine Gills, PTA 11/19/23, 9:46 AM

## 2023-11-19 NOTE — Progress Notes (Signed)
   11/19/23 1600  Spiritual Encounters  Type of Visit Attempt (pt unavailable)  Care provided to: Pt not available  Conversation partners present during encounter Nurse  Reason for visit Code  OnCall Visit Yes   Chaplain responded to a RRT page to the [place] phone and overhead speaker.  Chaplain got to the room and staff shared patient wasn't himself.  No family present.    Rev. Rana M. Nicholaus, M.Div. Chaplain Resident [facility]

## 2023-11-20 ENCOUNTER — Inpatient Hospital Stay

## 2023-11-20 DIAGNOSIS — D689 Coagulation defect, unspecified: Secondary | ICD-10-CM | POA: Diagnosis not present

## 2023-11-20 DIAGNOSIS — R29703 NIHSS score 3: Secondary | ICD-10-CM | POA: Diagnosis not present

## 2023-11-20 DIAGNOSIS — S0636AA Traumatic hemorrhage of cerebrum, unspecified, with loss of consciousness status unknown, initial encounter: Secondary | ICD-10-CM | POA: Diagnosis not present

## 2023-11-20 DIAGNOSIS — I615 Nontraumatic intracerebral hemorrhage, intraventricular: Secondary | ICD-10-CM | POA: Diagnosis not present

## 2023-11-20 LAB — GLUCOSE, CAPILLARY
Glucose-Capillary: 86 mg/dL (ref 70–99)
Glucose-Capillary: 283 mg/dL — ABNORMAL HIGH (ref 70–99)
Glucose-Capillary: 362 mg/dL — ABNORMAL HIGH (ref 70–99)
Glucose-Capillary: 249 mg/dL — ABNORMAL HIGH (ref 70–99)

## 2023-11-20 LAB — BASIC METABOLIC PANEL WITH GFR
Anion gap: 4 — ABNORMAL LOW (ref 5–15)
BUN: 23 mg/dL (ref 8–23)
CO2: 28 mmol/L (ref 22–32)
Calcium: 8.4 mg/dL — ABNORMAL LOW (ref 8.9–10.3)
Chloride: 105 mmol/L (ref 98–111)
Creatinine, Ser: 1.25 mg/dL — ABNORMAL HIGH (ref 0.61–1.24)
GFR, Estimated: >60 mL/min (ref >60)
Glucose, Bld: 54 mg/dL — ABNORMAL LOW (ref 70–99)
Potassium: 4.2 mmol/L (ref 3.5–5.1)
Sodium: 137 mmol/L (ref 135–145)

## 2023-11-20 LAB — CBC
HCT: 26.3 % — ABNORMAL LOW (ref 39.0–52.0)
Hemoglobin: 8.4 g/dL — ABNORMAL LOW (ref 13.0–17.0)
MCH: 31.5 pg (ref 26.0–34.0)
MCHC: 31.9 g/dL (ref 30.0–36.0)
MCV: 98.5 fL (ref 80.0–100.0)
Platelets: 247 K/uL (ref 150–400)
RBC: 2.67 MIL/uL — ABNORMAL LOW (ref 4.22–5.81)
RDW: 17.2 % — ABNORMAL HIGH (ref 11.5–15.5)
WBC: 5.3 K/uL (ref 4.0–10.5)
nRBC: 0 % (ref 0.0–0.2)

## 2023-11-20 LAB — BLOOD GAS, VENOUS
Acid-Base Excess: 5.1 mmol/L — ABNORMAL HIGH (ref 0.0–2.0)
Bicarbonate: 32.2 mmol/L — ABNORMAL HIGH (ref 20.0–28.0)
Patient temperature: 37
pCO2, Ven: 57 mmHg (ref 44–60)
pH, Ven: 7.36 (ref 7.25–7.43)

## 2023-11-20 MED ORDER — LABETALOL HCL 5 MG/ML IV SOLN
5.0000 mg | Freq: Four times a day (QID) | INTRAVENOUS | Status: DC | PRN
Start: 2023-11-20 — End: 2023-11-20

## 2023-11-20 MED ORDER — LABETALOL HCL 5 MG/ML IV SOLN: 5.0000 mg | Freq: Four times a day (QID) | INTRAVENOUS | Status: DC | PRN

## 2023-11-20 NOTE — Plan of Care (Signed)
  Problem: Education: Goal: Knowledge of disease or condition will improve Outcome: Progressing Goal: Knowledge of secondary prevention will improve (MUST DOCUMENT ALL) Outcome: Progressing Goal: Knowledge of patient specific risk factors will improve (DELETE if not current risk factor) Outcome: Progressing   Problem: Ischemic Stroke/TIA Tissue Perfusion: Goal: Complications of ischemic stroke/TIA will be minimized Outcome: Progressing   Problem: Coping: Goal: Will verbalize positive feelings about self Outcome: Progressing Goal: Will identify appropriate support needs Outcome: Progressing   Problem: Health Behavior/Discharge Planning: Goal: Ability to manage health-related needs will improve Outcome: Progressing Goal: Goals will be collaboratively established with patient/family Outcome: Progressing   Problem: Self-Care: Goal: Ability to participate in self-care as condition permits will improve Outcome: Progressing Goal: Verbalization of feelings and concerns over difficulty with self-care will improve Outcome: Progressing Goal: Ability to communicate needs accurately will improve Outcome: Progressing   Problem: Nutrition: Goal: Risk of aspiration will decrease Outcome: Progressing Goal: Dietary intake will improve Outcome: Progressing   Problem: Coping: Goal: Ability to adjust to condition or change in health will improve Outcome: Progressing   Problem: Fluid Volume: Goal: Ability to maintain a balanced intake and output will improve Outcome: Progressing   Problem: Health Behavior/Discharge Planning: Goal: Ability to identify and utilize available resources and services will improve Outcome: Progressing Goal: Ability to manage health-related needs will improve Outcome: Progressing   Problem: Education: Goal: Knowledge of General Education information will improve Description: Including pain rating scale, medication(s)/side effects and non-pharmacologic comfort  measures Outcome: Progressing

## 2023-11-20 NOTE — Significant Event (Signed)
 Rapid Response Event Note   Reason for Call : called for hypertension unresolving... called out code stroke   Initial Focused Assessment: sitting up in bed, alert and responsive, Dr  Voncile at bedside examining pt      Interventions: stat head ct   Plan of Care: as above, charge RN Robyn to call for further assistance.    Event Summary: as above  MD Notified: Arora/ Ponnala Call [phone] Arrival Time:1526 End Upfz:8464  Chau Sawin A, RN

## 2023-11-20 NOTE — Progress Notes (Signed)
 Mobility Specialist - Progress Note    During mobility: HR 106, BP(214/144), SpO2(100) Post-mobility: HR, BP(194/102), SPO2     11/20/23 1617  Mobility  Activity Stood at bedside;Pivoted/transferred from bed to chair  Level of Assistance Moderate assist, patient does 50-74%  Assistive Device Front wheel walker  Distance Ambulated (ft) 4 ft  Range of Motion/Exercises Active  Activity Response Tolerated well  Mobility Referral Yes  Mobility visit 1 Mobility  Mobility Specialist Start Time (ACUTE ONLY) 1500  Mobility Specialist Stop Time (ACUTE ONLY) 1531  Mobility Specialist Time Calculation (min) (ACUTE ONLY) 31 min   Pt resting in bed on RA upon entry. Pt moved to EOB ModI utlizing railing and bed to pull himself forward and required extra time for movement. Pt respond delayed at the session progressed. Pt STS ModA to RW and Pt took steps forward and back to recliner. Pt required manual sit down and patients responses became even more delayed. Pt put his hands on his head and slumped over a bit. when asked where he was he could not tell me but when asked if he was dfizzy or ligfhtheaded he said no. MS called charge nurse and stroke assessment was performed. MS called code stroke and session ended. Pt transported to CT.   Guido Rumble Mobility Specialist 11/20/23, 4:49 PM

## 2023-11-20 NOTE — Progress Notes (Addendum)
 Progress Note   Patient: Nicholas Weiss FMW:[id number] DOB: Jul 06, 1950 DOA: 11/15/2023     4 DOS: the patient was seen and examined on 11/20/2023   Brief hospital course: 73 y.o. male with medical history significant for type I DM, HLD, recent history of fall with subdural hematoma in June 2025, chronic orthostatic hypotension, A-fib on Eliquis , seizure disorder, mild cognitive impairment, EtOH use who was brought in from home after he lost consciousness, fell on the ground, has been having recurrent falls and confused at baseline.  Workup revealed intraventricular right lateral ventricle and occipital horn of right ventricle on CT scan of the brain, seen by neurosurgery recommended conservative management.  Neurology was consulted, MRI negative for acute stroke, recommend holding anticoagulation long-term due to recurrent falls and history of SDH and now with intraventricular hemorrhage.  Patient was found to be lethargic on 08/20, with CT stable  Rapid response was called on 08/22 as patient was found unresponsive, CT head stable, improved back to baseline within an hour with no intervention  Assessment and Plan:   Intraventricular hemorrhage (HCC) -Appreciate neurosurgical consultation recommended conservative management.   -Appreciate neurological consultation.  MRI does not show any acute stroke -CTA calcified atherosclerosis at carotid bifurcations approximately 50% stenosis of proximal cervical ICAs.  Severe stenosis right vertebral artery origin. -Repeat CT 08/20 unchanged focus of hemorrhage within body of right lateral ventricle, Layering hemorrhage within the occipital horns of both lateral ventricles, slightly increased (likely related to layering/ redistribution) - Holding Eliquis .  PT/OT eval - Rapid response was called on 08/22 as patient was unresponsive, repeat CT head No new or worsening intracranial hemorrhage - Code stroke called 08/23 - CT stroke stat pending, Labetalol  prn  for high BP - Patient is intermittently more responsive, waxing and waning mental status - poor po intake, encourage po intake and hydration - Follow-up with outpatient neurology and neurosurgery in 2 weeks with repeat CT - SLP pureed diet    Syncope and collapse, recurrent - History of orthostatic hypotension.   ontinue midodrine  with holding parameters if blood pressure greater than 120 for now.  Once he starts moving around with physical therapy likely will have to give prior to ambulation Abdominal binder and TED hose - IV Labetalol  5mg  prn if SBP > 160   Uncontrolled type 1 diabetes mellitus with hyperglycemia, with long-term current use of insulin  (HCC) Uses insulin  pump at home. will do Semglee  6u, insulin  plus sliding scale. Was low this morning, but had 75% breakfast   Hyponatremia Sodium 135   Seizure disorder (HCC) Continue Keppra  and gabapentin    Anemia of chronic disease Last hemoglobin 9.0   Left supracondylar humerus fracture, sequela Seen on hospitalization 2 months ago   MCI (mild cognitive impairment) with memory loss As needed risperidol for agitation. Seroquel  at night -Outpatient neuropsychological testing   Subjective: Patient is lying in the bed, minimally responsive and answering simple questions Intermittently more responsive, most times minimally responsive with poor po intake Called partner Nicholas Weiss, left voicemail   Physical Exam: Vitals:   11/19/23 2339 11/20/23 0341 11/20/23 0803 11/20/23 1147  BP: 133/85 135/87 (!) 148/98 (!) 138/93  Pulse: 87 92 89 86  Resp:  18 16 16   Temp: 98.5 F (36.9 C) 98.4 F (36.9 C) 98.5 F (36.9 C) 98.4 F (36.9 C)  TempSrc:      SpO2: 100% 100% 100% 100%  Weight:      Height:       Cardiovascular:  Rate and Rhythm: Normal rate. Rhythm irregularly irregular.     Heart sounds: Normal heart sounds, S1 normal and S2 normal.  Pulmonary:     Breath sounds: No decreased breath sounds, wheezing,  rhonchi or rales.  Abdominal:     Palpations: Abdomen is soft.     Tenderness: There is no abdominal tenderness.  Musculoskeletal:     Right lower leg: No swelling.     Left lower leg: No swelling.  Skin:    General: Skin is warm.     Findings: No rash.  Neurological:     Mental Status: Minimally responsive, answering simple questions, following simple commands   Family Communication: Florence Mems, left voicemail  Disposition: Status is: Inpatient Remains inpatient appropriate because  Planned Discharge Destination: Skilled nursing facility  Time spent: 39 minutes   Author: Laree Lock, MD 11/20/2023 2:21 PM  For on call review [email or web address].

## 2023-11-20 NOTE — Plan of Care (Signed)
 Patient remains free from any noted signs of acute distress.  Has not required any additional medical interventions.  Patient to continue to be monitored by hospital staff until discharged.

## 2023-11-20 NOTE — Progress Notes (Signed)
 Attempted to call [name] with an update on patient. Got voicemail.

## 2023-11-20 NOTE — Progress Notes (Signed)
 NEUROLOGY CONSULT FOLLOW UP NOTE   Date of service: November 20, 2023 Patient Name: Nicholas Weiss MRN:  [id number] DOB:  01-Dec-1950  Interval Hx/subjective  CODE STROKE activated by floor nursing team for possible left-sided weakness and blood pressure in the 190s.  No new focal deficits.  Exam unchanged from prior. No other changes noted  Vitals   Vitals:   11/20/23 0803 11/20/23 1147 11/20/23 1508 11/20/23 1515  BP: (!) 148/98 (!) 138/93 (!) 214/140 (!) 192/144  Pulse: 89 86 (!) 102 (!) 101  Resp: 16 16    Temp: 98.5 F (36.9 C) 98.4 F (36.9 C)    TempSrc:      SpO2: 100% 100%    Weight:      Height:         Body mass index is 19.55 kg/m.  Physical Exam  General: Awake alert in no distress HEENT: Normocephalic atraumatic Lungs: Clear Neurologic exam Awake alert oriented to self.  Diminished attention concentration.  Mildly dysarthric speech unchanged from my previous encounter with him on 11/18/2023. He follows commands consistently Naming repetition and comprehension are intact Cranial nerves II to XII intact Motor examination: Left upper extremity immobile due to fracture.  Right upper extremity no drift.  Both legs able to go antigravity. Sensation intact Coordination-no dysmetria NIHSS 1a Level of Conscious.: 0 1b LOC Questions: 2 1c LOC Commands: 0 2 Best Gaze: 0 3 Visual: 0 4 Facial Palsy: 0 5a Motor Arm - left: (UN) 5b Motor Arm - Right: 0 6a Motor Leg - Left: 0 6b Motor Leg - Right: 0 7 Limb Ataxia: 0 8 Sensory: 0 9 Best Language: 0 10 Dysarthria: 1 11 Extinct. and Inatten.:0 TOTAL: 3   Medications  Current Facility-Administered Medications:    acetaminophen  (TYLENOL ) tablet 650 mg, 650 mg, Oral, Q6H PRN **OR** acetaminophen  (TYLENOL ) suppository 650 mg, 650 mg, Rectal, Q6H PRN, Paudel, Keshab, MD   acyclovir  (ZOVIRAX ) 200 MG capsule 400 mg, 400 mg, Oral, Daily, Wieting, Richard, MD, 400 mg at 11/20/23 0932   atorvastatin  (LIPITOR ) tablet 80  mg, 80 mg, Oral, QHS, Paudel, Keshab, MD, 80 mg at 11/19/23 2240   gabapentin  (NEURONTIN ) capsule 300 mg, 300 mg, Oral, BID, Josette, Richard, MD, 300 mg at 11/20/23 0932   insulin  aspart (novoLOG ) injection 0-5 Units, 0-5 Units, Subcutaneous, QHS, Wieting, Richard, MD, 2 Units at 11/19/23 2239   insulin  aspart (novoLOG ) injection 0-6 Units, 0-6 Units, Subcutaneous, TID WC, Wieting, Richard, MD, 3 Units at 11/20/23 1241   insulin  glargine (LANTUS ) injection 6 Units, 6 Units, Subcutaneous, Daily, Ponnala, Shruthi, MD, 6 Units at 11/20/23 9061   labetalol  (NORMODYNE ) injection 5 mg, 5 mg, Intravenous, Q6H PRN, Ponnala, Shruthi, MD   latanoprost  (XALATAN ) 0.005 % ophthalmic solution 1 drop, 1 drop, Both Eyes, QHS, Paudel, Keshab, MD, 1 drop at 11/19/23 2240   levETIRAcetam  (KEPPRA ) tablet 500 mg, 500 mg, Oral, BID, Paudel, Keshab, MD, 500 mg at 11/20/23 0932   midodrine  (PROAMATINE ) tablet 2.5 mg, 2.5 mg, Oral, TID, Wieting, Richard, MD, 2.5 mg at 11/18/23 9354   Oral care mouth rinse, 15 mL, Mouth Rinse, PRN, Paudel, Keshab, MD   oxyCODONE -acetaminophen  (PERCOCET/ROXICET) 5-325 MG per tablet 1 tablet, 1 tablet, Oral, Q6H PRN, Paudel, Keshab, MD, 1 tablet at 11/17/23 0025   polyethylene glycol (MIRALAX  / GLYCOLAX ) packet 17 g, 17 g, Oral, Daily PRN, Paudel, Keshab, MD   risperiDONE  (RISPERDAL ) tablet 0.5 mg, 0.5 mg, Oral, Q8H PRN, Josette Ade, MD, 0.5 mg at 11/17/23 1216  Facility-Administered Medications Ordered in Other Encounters:    epoetin  alfa-epbx (RETACRIT ) injection 40,000 Units, 40,000 Units, Subcutaneous, Q21 days, Melanee Annah BROCKS, MD, 40,000 Units at 07/09/23 1153  Labs and Diagnostic Imaging   CBC:  Recent Labs  Lab 11/19/23 1654 11/20/23 0523  WBC 3.6* 5.3  HGB 9.4* 8.4*  HCT 28.8* 26.3*  MCV 98.0 98.5  PLT 256 247    Basic Metabolic Panel:  Lab Results  Component Value Date   NA 137 11/20/2023   K 4.2 11/20/2023   CO2 28 11/20/2023   GLUCOSE 54 (L) 11/20/2023    BUN 23 11/20/2023   CREATININE 1.25 (H) 11/20/2023   CALCIUM  8.4 (L) 11/20/2023   GFRNONAA >60 11/20/2023   GFRAA 54 (L) 03/15/2020   Lipid Panel:  Lab Results  Component Value Date   LDLCALC 44 11/16/2023   HgbA1c:  Lab Results  Component Value Date   HGBA1C 10.0 (H) 07/21/2023   Urine Drug Screen:     Component Value Date/Time   LABOPIA NONE DETECTED 09/16/2023 1821   COCAINSCRNUR NONE DETECTED 09/16/2023 1821   LABBENZ NONE DETECTED 09/16/2023 1821   AMPHETMU NONE DETECTED 09/16/2023 1821   THCU NONE DETECTED 09/16/2023 1821   LABBARB NONE DETECTED 09/16/2023 1821    Alcohol Level     Component Value Date/Time   ETH <15 09/11/2023 1930   INR  Lab Results  Component Value Date   INR 1.3 (H) 11/16/2023   APTT  Lab Results  Component Value Date   APTT 111 (H) 08/16/2023   AED levels:  Lab Results  Component Value Date   LEVETIRACETA 31.5 08/26/2023   Imaging personally reviewed  CT Head without contrast, CT C-spine upon arrival to ED morning of 11/15/2023 (Personally reviewed): Small volume acute IVH greater on the right and likely posttraumatic.  No associated ventriculomegaly.  No other acute intracranial abnormality or acute traumatic injury identified.  Degenerative changes in the cervical spine without evidence of an acute fracture or traumatic subluxation   Repeat CT head while in the hospital afternoon of 11/15/2023: Similar focus of IVH within the body of the right lateral ventricle.  Additional small focus of hemorrhage within the body of the left lateral ventricle-slightly decreased in size.  Small focus of hemorrhage in the right occipital horn is new from prior-probably redistribution of blood products.  CT angiography head and neck which included CT head 11/16/2023, around 3 PM: Mild increase in the IVH from prior.  No vascular malformation.  MR brain without contrast done around noon 11/16/2023-intraventricular blood products in the lateral ventricles,  similar to recent CT.  No midline shift.  Mild to moderate chronic microvascular ischemic changes.  CT head 11/17/2023-unchanged focus of hemorrhage within the body of the right lateral ventricle 2.3 x 1.2 cm.  Layering hemorrhage within the occipital horns of both lateral ventricles slightly increased.  Unchanged size and configuration of the ventricular system.  Moderate chronic small vessel disease  CT head code stroke 11/20/2023-unchanged intraventricular hemorrhage   Assessment   Nicholas Weiss is a 73 y.o. male past history of type 1 diabetes, chronic orthostatic hypotension, paroxysmal A-fib on Eliquis  prior to arrival, seizures on antiseizure medications, mild cognitive impairment without clear baseline-no family at bedside, a fall with subdural hematoma in June 2025 and another admission on 11/11/2023 for hyperosmolar nonketotic state as well as AKI on CKD, presented back to the emergency department on 11/15/2023 after a fall.  CT head revealed intraventricular hemorrhage.  Repeat imaging  with CT head  x 2-second CT along with the CT angio head and neck showed a mild increase in the IVH.  No vascular malformation seen on the CT angiography.  MRI brain does not reveal any underlying ischemic infarct or other abnormalities.    Seen by neurology and neurosurgery.  Remains stable and pending placement.  This afternoon, code stroke was activated due to possible left-sided weakness which is his baseline from the fracture.  Blood pressure was higher.  Blood pressure management has been an issue with orthostatic hypotension and now hypertension.  Weakness on the left arm is chronic due to fracture from June. Exam otherwise is unchanged  Impression: Intraventricular hemorrhage-related to trauma in the setting of coagulopathy with DOAC at home, no new change on exam today.  Recommendations  Cancel code stroke Continue medical management per primary team. Continue to hold Eliquis , antiplatelets  anticoagulants. I would not resume his anticoagulation or antiplatelets upon discharge and let him follow-up closely with neurology and neurosurgery as previously recommended and then decide on whether it is safe to resume anticoagulation since he has had a subdural hematoma in June and an intraventricular bleed now in August related to his frequent falls.  He needs outpatient neuropsychological testing for his cognitive impairment.  Outpatient neurology and neurosurgical follow-up. For his blood pressure management-I would recommend reaching out to cardiology if the management of labile pressures and high orthostatic hypotension is challenging.  For now I would order as needed labetalol  and keep systolic blood pressure less than 160.  Inpatient neurology will be available as needed.  Please call with questions  Plan relayed to Dr. Jerelene  -- Eligio Lav, MD Neurologist [facility]

## 2023-11-20 NOTE — Progress Notes (Signed)
 Called into the pt's room by the Mobility Tech, [place], who verbalized that she got the pt oob and had to get the pt into the chair quickly and the pt started leaning to the right side; the pt was mumbling and was A&O to self only; at 1508 BP 214/140 HR 112; see separate flowsheet for neuro assessment; no PRN medications ordered for BP nor HR;  at 1512 FSBS = 362; at 1515 BP 192/144 HR 101; at 1518 secure chat sent to Dr Jerelene indicating above VS and no PRN medications ordered; at 1521 Code Stroke called from Arianna's phone; no overhead page heard; at 1523 [facility] called emergency number [phone] indicating Code Stroke to be overhead paged; this writer called the ICU CN phone advising her of the pt's VS and current neuro symptoms, CN on her way down; Code Stroke team arrived in the room; secure chat at 1525 from Dr Jerelene indicated the same thing happened yesterday.  It's not new for him.  See separate orders in [facility]; pt taken to CT for stat scan of the head by this writer

## 2023-11-21 DIAGNOSIS — I615 Nontraumatic intracerebral hemorrhage, intraventricular: Secondary | ICD-10-CM | POA: Diagnosis not present

## 2023-11-21 LAB — BASIC METABOLIC PANEL WITH GFR
Anion gap: 6 (ref 5–15)
BUN: 29 mg/dL — ABNORMAL HIGH (ref 8–23)
CO2: 26 mmol/L (ref 22–32)
Calcium: 8.2 mg/dL — ABNORMAL LOW (ref 8.9–10.3)
Chloride: 104 mmol/L (ref 98–111)
Creatinine, Ser: 1.06 mg/dL (ref 0.61–1.24)
GFR, Estimated: >60 mL/min (ref >60)
Glucose, Bld: 124 mg/dL — ABNORMAL HIGH (ref 70–99)
Potassium: 4.5 mmol/L (ref 3.5–5.1)
Sodium: 136 mmol/L (ref 135–145)

## 2023-11-21 LAB — CBC
HCT: 29.7 % — ABNORMAL LOW (ref 39.0–52.0)
Hemoglobin: 9.6 g/dL — ABNORMAL LOW (ref 13.0–17.0)
MCH: 31.7 pg (ref 26.0–34.0)
MCHC: 32.3 g/dL (ref 30.0–36.0)
MCV: 98 fL (ref 80.0–100.0)
Platelets: 249 K/uL (ref 150–400)
RBC: 3.03 MIL/uL — ABNORMAL LOW (ref 4.22–5.81)
RDW: 16.9 % — ABNORMAL HIGH (ref 11.5–15.5)
WBC: 5.2 K/uL (ref 4.0–10.5)
nRBC: 0 % (ref 0.0–0.2)

## 2023-11-21 LAB — GLUCOSE, CAPILLARY
Glucose-Capillary: 125 mg/dL — ABNORMAL HIGH (ref 70–99)
Glucose-Capillary: 129 mg/dL — ABNORMAL HIGH (ref 70–99)
Glucose-Capillary: 283 mg/dL — ABNORMAL HIGH (ref 70–99)
Glucose-Capillary: 257 mg/dL — ABNORMAL HIGH (ref 70–99)

## 2023-11-21 MED ORDER — LACTATED RINGERS IV SOLN: INTRAVENOUS | Status: DC

## 2023-11-21 NOTE — Progress Notes (Signed)
 Progress Note   Patient: Nicholas Weiss FMW:[id number] DOB: Mar 02, 1951 DOA: 11/15/2023     5 DOS: the patient was seen and examined on 11/21/2023   Brief hospital course: 73 y.o. male with medical history significant for type I DM, HLD, recent history of fall with subdural hematoma in June 2025, chronic orthostatic hypotension, A-fib on Eliquis , seizure disorder, mild cognitive impairment, EtOH use who was brought in from home after he lost consciousness, fell on the ground, has been having recurrent falls and confused at baseline.  Workup revealed intraventricular right lateral ventricle and occipital horn of right ventricle on CT scan of the brain, seen by neurosurgery recommended conservative management.  Neurology was consulted, MRI negative for acute stroke, recommend holding anticoagulation long-term due to recurrent falls and history of SDH and now with intraventricular hemorrhage.  Patient was found to be lethargic on 08/20, with CT stable  Rapid response was called on 08/22 as patient was found unresponsive, CT head stable, improved back to baseline within an hour with no intervention. Code stroke was called 08/23, CT stroke stable Consulted Cardiology for management of Orthostatic hypotension, labile  Assessment and Plan:   Intraventricular hemorrhage (HCC) -Appreciate neurosurgical consultation recommended conservative management.   -Appreciate neurological consultation.  MRI does not show any acute stroke -CTA calcified atherosclerosis at carotid bifurcations approximately 50% stenosis of proximal cervical ICAs.  Severe stenosis right vertebral artery origin. -Repeat CT 08/20 unchanged focus of hemorrhage within body of right lateral ventricle, Layering hemorrhage within the occipital horns of both lateral ventricles, slightly increased (likely related to layering/ redistribution) - Holding Eliquis .  PT/OT eval - Rapid response was called on 08/22 as patient was unresponsive, repeat  CT head No new or worsening intracranial hemorrhage - Code stroke called 08/23 - CT stroke stable - Patient is intermittently more responsive, waxing and waning mental status - poor po intake, encourage po intake and hydration - Follow-up with outpatient neurology and neurosurgery in 2 weeks with repeat CT - SLP pureed diet    Syncope and collapse, recurrent - History of orthostatic hypotension HTN - Continue midodrine  with holding parameters if SBP > 120 - Abdominal binder and TED hose - Cardiology consulted for labile BP and orthostatic hypotension - notified Dr.Callwood - IV Labetalol  5mg  prn if SBP > 160   Uncontrolled type 1 diabetes mellitus with hyperglycemia, with long-term current use of insulin  (HCC) Uses insulin  pump at home. will do Semglee  6u, insulin  plus sliding scale. Labile due to changes in po intake   Hyponatremia Sodium 135   Seizure disorder (HCC) Continue Keppra  and gabapentin    Anemia of chronic disease Last hemoglobin 9.0   Left supracondylar humerus fracture, sequela Seen on hospitalization 2 months ago   MCI (mild cognitive impairment) with memory loss As needed risperidol for agitation. Seroquel  at night -Outpatient neuropsychological testing   Subjective: Patient is lying in the bed, minimally responsive and answering simple questions Intermittently more responsive, most times minimally responsive with poor po intake Called partner [name], left voicemail   Physical Exam: Vitals:   11/20/23 1941 11/21/23 0357 11/21/23 0753 11/21/23 1200  BP: 110/75 (!) 180/105 129/86 124/86  Pulse: 86 83 92   Resp:   18   Temp: (!) 97.4 F (36.3 C) 97.8 F (36.6 C) 98.2 F (36.8 C)   TempSrc: Oral Oral    SpO2: 100% 100% 100%   Weight:      Height:       Cardiovascular:  Rate and Rhythm: Normal rate. Rhythm irregularly irregular.     Heart sounds: Normal heart sounds, S1 normal and S2 normal.  Pulmonary:     Breath sounds: No decreased  breath sounds, wheezing, rhonchi or rales.  Abdominal:     Palpations: Abdomen is soft.     Tenderness: There is no abdominal tenderness.  Musculoskeletal:     Right lower leg: No swelling.     Left lower leg: No swelling.  Skin:    General: Skin is warm.     Findings: No rash.  Neurological:     Mental Status: Minimally responsive, answering simple questions, following simple commands   Family Communication: Florence Mems, left voicemail  Disposition: Status is: Inpatient Remains inpatient appropriate because  Planned Discharge Destination: Skilled nursing facility  Time spent: 39 minutes   Author: Laree Lock, MD 11/21/2023 2:23 PM  For on call review [email or web address].

## 2023-11-21 NOTE — Plan of Care (Signed)
  Problem: Education: Goal: Knowledge of disease or condition will improve Outcome: Progressing Goal: Knowledge of secondary prevention will improve (MUST DOCUMENT ALL) Outcome: Progressing Goal: Knowledge of patient specific risk factors will improve (DELETE if not current risk factor) Outcome: Progressing   Problem: Ischemic Stroke/TIA Tissue Perfusion: Goal: Complications of ischemic stroke/TIA will be minimized Outcome: Progressing   Problem: Health Behavior/Discharge Planning: Goal: Ability to manage health-related needs will improve Outcome: Progressing Goal: Goals will be collaboratively established with patient/family Outcome: Progressing   Problem: Self-Care: Goal: Ability to participate in self-care as condition permits will improve Outcome: Progressing Goal: Verbalization of feelings and concerns over difficulty with self-care will improve Outcome: Progressing Goal: Ability to communicate needs accurately will improve Outcome: Progressing   Problem: Education: Goal: Ability to describe self-care measures that may prevent or decrease complications (Diabetes Survival Skills Education) will improve Outcome: Progressing   Problem: Health Behavior/Discharge Planning: Goal: Ability to identify and utilize available resources and services will improve Outcome: Progressing Goal: Ability to manage health-related needs will improve Outcome: Progressing   Problem: Skin Integrity: Goal: Risk for impaired skin integrity will decrease Outcome: Progressing   Problem: Clinical Measurements: Goal: Ability to maintain clinical measurements within normal limits will improve Outcome: Progressing Goal: Will remain free from infection Outcome: Progressing Goal: Diagnostic test results will improve Outcome: Progressing Goal: Respiratory complications will improve Outcome: Progressing Goal: Cardiovascular complication will be avoided Outcome: Progressing   Problem:  Nutrition: Goal: Adequate nutrition will be maintained Outcome: Progressing   Problem: Safety: Goal: Ability to remain free from injury will improve Outcome: Progressing   Problem: Education: Goal: Knowledge of disease or condition will improve Outcome: Progressing Goal: Knowledge of secondary prevention will improve (MUST DOCUMENT ALL) Outcome: Progressing Goal: Knowledge of patient specific risk factors will improve (DELETE if not current risk factor) Outcome: Progressing

## 2023-11-21 NOTE — Consult Note (Signed)
 CARDIOLOGY CONSULT NOTE               Patient ID: Nicholas Weiss MRN: [id number] DOB/AGE: 05-21-1950 73 y.o.  Admit date: 11/15/2023 Referring Physician Dr Guido Rumble hospitalist Primary Physician Dr. Alla primary Primary Cardiologist  Reason for Consultation possible syncope /orthostatic hypotension  HPI: 73 year old male history of intracranial hemorrhage diabetes hyperlipidemia falls resulting in subdural hematoma back in June he is on chronic orthostatic hypotension A-fib on Eliquis  seizure disorder cognitive impairment alcohol abuse brought in with loss of consciousness this wife reports he did not hit his head but he was confused his workup showed no new bleed he has had recurrent orthostatic changes so cardiology was consulted  Review of systems complete and found to be negative unless listed above     Past Medical History:  Diagnosis Date   Acute metabolic encephalopathy 12/02/2020   DKA (diabetic ketoacidoses) 04/06/2016   Hypercholesteremia    Hypertension     Past Surgical History:  Procedure Laterality Date   AMPUTATION TOE Left 04/25/2022   Procedure: LEFT GREAT TOE AMPUTATION AND LEFT PARTIAL 2ND TOE AMPUTATION;  Surgeon: Janit Thresa HERO, DPM;  Location: [facility] ORS;  Service: Podiatry;  Laterality: Left;   COLONOSCOPY WITH PROPOFOL  N/A 02/01/2020   Procedure: COLONOSCOPY WITH PROPOFOL ;  Surgeon: Unk Corinn Skiff, MD;  Location: [facility] ENDOSCOPY;  Service: Gastroenterology;  Laterality: N/A;   ESOPHAGOGASTRODUODENOSCOPY  02/01/2020   Procedure: ESOPHAGOGASTRODUODENOSCOPY (EGD);  Surgeon: Unk Corinn Skiff, MD;  Location: [facility] ENDOSCOPY;  Service: Gastroenterology;;   ESOPHAGOGASTRODUODENOSCOPY (EGD) WITH PROPOFOL  N/A 06/07/2022   Procedure: ESOPHAGOGASTRODUODENOSCOPY (EGD) WITH PROPOFOL ;  Surgeon: Saintclair Jasper, MD;  Location: [facility] ENDOSCOPY;  Service: Gastroenterology;  Laterality: N/A;   KNEE SURGERY Right    Torn meniscus   KNEE SURGERY Left      Medications Prior to Admission  Medication Sig Dispense Refill Last Dose/Taking   acetaminophen  (TYLENOL ) 325 MG tablet Take 2 tablets (650 mg total) by mouth every 6 (six) hours as needed for mild pain (pain score 1-3) or fever.   Taking As Needed   acyclovir  (ZOVIRAX ) 400 MG tablet Take 400 mg by mouth in the morning and at bedtime.   11/14/2023   apixaban  (ELIQUIS ) 5 MG TABS tablet Take 5 mg by mouth 2 (two) times daily.   11/14/2023   atorvastatin  (LIPITOR ) 80 MG tablet Take 1 tablet (80 mg total) by mouth at bedtime. Hold while taking Paxlovid  30 tablet 2 11/14/2023   fludrocortisone  (FLORINEF ) 0.1 MG tablet Take 1 tablet (0.1 mg total) by mouth daily as needed. Skip the dose if SBP >130 (Patient taking differently: Take 0.1 mg by mouth daily. Skip the dose if SBP >130)   11/14/2023   gabapentin  (NEURONTIN ) 300 MG capsule Take 1 capsule (300 mg total) by mouth 2 (two) times daily. 60 capsule 0 11/14/2023   insulin  lispro (HUMALOG) 100 UNIT/ML injection INJECT 150 UNITS EVERY 3 DAYS VIA INSULIN  PUMP AS DIRECTED   11/14/2023   latanoprost  (XALATAN ) 0.005 % ophthalmic solution 1 drop at bedtime.   11/14/2023   levETIRAcetam  (KEPPRA ) 500 MG tablet Take 1 tablet by mouth twice daily 60 tablet 0 11/14/2023   midodrine  (PROAMATINE ) 5 MG tablet Take 1 tablet (5 mg total) by mouth 3 (three) times daily. 90 tablet 0 11/14/2023   oxyCODONE -acetaminophen  (PERCOCET/ROXICET) 5-325 MG tablet Take 1 tablet by mouth every 6 (six) hours as needed for severe pain (pain score 7-10).   Taking As Needed   pomalidomide  (POMALYST ) 2  MG capsule Take 1 capsule (2 mg total) by mouth daily. Take for 21 days, the hold for 7 days. Repeat every 28 days. 21 capsule 0 11/14/2023   Continuous Glucose Sensor (DEXCOM G7 SENSOR) MISC 1 each by Does not apply route 4 (four) times daily -  before meals and at bedtime. 1 each 0    iron  polysaccharides (NIFEREX) 150 MG capsule Take 1 capsule (150 mg total) by mouth daily. (Patient not taking:  Reported on 11/11/2023)      Social History   Socioeconomic History   Marital status: Single    Spouse name: Not on file   Number of children: 3   Years of education: Not on file   Highest education level: High school graduate  Occupational History    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  Tobacco Use   Smoking status: Never   Smokeless tobacco: Never  Vaping Use   Vaping status: Never Used  Substance and Sexual Activity   Alcohol use: Not Currently    Alcohol/week: 0.0 - 1.0 standard drinks of alcohol    Comment: once every 2 months   Drug use: Yes    Types: Marijuana, Crack cocaine    Comment: last week   Sexual activity: Not Currently    Birth control/protection: None  Other Topics Concern   Not on file  Social History Narrative   Lives with girlfriend sharon   Social Drivers of Health   Financial Resource Strain: Low Risk  (10/20/2023)   Received from [facility]   Overall Financial Resource Strain (CARDIA)    Difficulty of Paying Living Expenses: Not hard at all  Food Insecurity: No Food Insecurity (11/16/2023)   Hunger Vital Sign    Worried About Running Out of Food in the Last Year: Never true    Ran Out of Food in the Last Year: Never true  Transportation Needs: No Transportation Needs (11/16/2023)   PRAPARE - Administrator, Civil Service (Medical): No    Lack of Transportation (Non-Medical): No  Physical Activity: Insufficiently Active (01/01/2020)   Exercise Vital Sign    Days of Exercise per Week: 7 days    Minutes of Exercise per Session: 20 min  Stress: No Stress Concern Present (01/01/2020)   [employer] of Occupational Health - Occupational Stress Questionnaire    Feeling of Stress : Not at all  Social Connections: Moderately Isolated (11/16/2023)   Social Connection and Isolation Panel    Frequency of Communication with Friends and Family: More than three times a week    Frequency of Social Gatherings with Friends and  Family: More than three times a week    Attends Religious Services: Never    Database administrator or Organizations: No    Attends Banker Meetings: Never    Marital Status: Living with partner  Intimate Partner Violence: Not At Risk (11/16/2023)   Humiliation, Afraid, Rape, and Kick questionnaire    Fear of Current or Ex-Partner: No    Emotionally Abused: No    Physically Abused: No    Sexually Abused: No    Family History  Problem Relation Age of Onset   Heart attack Father    Hypertension Sister    Cancer Sister       Review of systems complete and found to be negative unless listed above      PHYSICAL EXAM  General: Well developed, well nourished, in no acute distress HEENT:  Normocephalic and atramatic  Neck:  No JVD.  Lungs: Clear bilaterally to auscultation and percussion. Heart: HRRR . Normal S1 and S2 without gallops or murmurs.  Abdomen: Bowel sounds are positive, abdomen soft and non-tender  Msk:  Back normal, normal gait. Normal strength and tone for age. Extremities: No clubbing, cyanosis or edema.   Neuro: Alert and oriented X 3. Psych:  Good affect, confused appears to be baseline  Labs:   Lab Results  Component Value Date   WBC 5.2 11/21/2023   HGB 9.6 (L) 11/21/2023   HCT 29.7 (L) 11/21/2023   MCV 98.0 11/21/2023   PLT 249 11/21/2023    Recent Labs  Lab 11/16/23 0307 11/17/23 0624 11/21/23 0612  NA 133*   < > 136  K 4.0   < > 4.5  CL 101   < > 104  CO2 23   < > 26  BUN 24*   < > 29*  CREATININE 1.12   < > 1.06  CALCIUM  8.2*   < > 8.2*  PROT 8.2*  --   --   BILITOT 1.2  --   --   ALKPHOS 76  --   --   ALT 27  --   --   AST 20  --   --   GLUCOSE 237*   < > 124*   < > = values in this interval not displayed.   Lab Results  Component Value Date   CKTOTAL 48 (L) 07/21/2023   TROPONINI 0.04 08/02/2018    Lab Results  Component Value Date   CHOL 104 11/16/2023   CHOL 128 08/10/2021   CHOL 118 03/15/2020   Lab Results   Component Value Date   HDL 49 11/16/2023   HDL 38 (L) 08/10/2021   HDL 49 03/15/2020   Lab Results  Component Value Date   LDLCALC 44 11/16/2023   LDLCALC 60 08/10/2021   LDLCALC 53 03/15/2020   Lab Results  Component Value Date   TRIG 54 11/16/2023   TRIG 152 (H) 08/10/2021   TRIG 80 03/15/2020   Lab Results  Component Value Date   CHOLHDL 2.1 11/16/2023   CHOLHDL 3.4 08/10/2021   CHOLHDL 2.4 03/15/2020   No results found for: LDLDIRECT    Radiology: CT HEAD CODE STROKE WO CONTRAST` Result Date: 11/20/2023 CLINICAL DATA:  Provided history: Neuro deficit, acute, stroke suspected. EXAM: CT HEAD WITHOUT CONTRAST TECHNIQUE: Contiguous axial images were obtained from the base of the skull through the vertex without intravenous contrast. RADIATION DOSE REDUCTION: This exam was performed according to the departmental dose-optimization program which includes automated exposure control, adjustment of the mA and/or kV according to patient size and/or use of iterative reconstruction technique. COMPARISON:  Head CT 11/19/2023. FINDINGS: Brain: Generalized cerebral atrophy. A focus of hemorrhage within the body of the right lateral ventricle is unchanged as compared to yesterday's head CT, again measuring 1.7 x 1.3 cm in transaxial dimensions. Small-volume hemorrhage within the occipital horns of both lateral ventricles, also unchanged. Stable size and configuration of the ventricular system. No evidence of an interval acute intracranial hemorrhage. Patchy and ill-defined hypoattenuation within the cerebral white matter, nonspecific but compatible with moderate chronic small vessel ischemic disease. Prominent dural calcifications. No demarcated cortical infarct. No extra-axial fluid collection. No midline shift. Vascular: No hyperdense vessel.  Atherosclerotic calcifications. Skull: No calvarial fracture or aggressive osseous lesion. Sinuses/Orbits: No mass or acute finding within the imaged  orbits. Minimal mucosal thickening within the right maxillary sinus at the  imaged levels. Tiny mucous retention cyst within the left maxillary sinus at the imaged levels. No evidence of an interval acute intracranially as compared yesterday's head CT. These results were communicated to Dr. Voncile at 3:48 pmon 8/23/2025by text page via the [facility] messaging system. IMPRESSION: 1. 1.7 x 1.3 cm focus of hemorrhage within the body of the right lateral ventricle, unchanged from yesterday's head CT. 2. Small-volume hemorrhage within the occipital horns of both lateral ventricles, also unchanged. 3. Stable size and configuration of the ventricular system. 4. No evidence of an interval acute intracranial hemorrhage. 5. Moderate chronic small vessel ischemic changes within the cerebral white matter. 6. Generalized cerebral atrophy Electronically Signed   By: Rockey Childs D.O.   On: 11/20/2023 15:48   CT HEAD WO CONTRAST ( ) Result Date: 11/19/2023 CLINICAL DATA:  Mental status change. Follow-up intracranial hemorrhage EXAM: CT HEAD WITHOUT CONTRAST TECHNIQUE: Contiguous axial images were obtained from the base of the skull through the vertex without intravenous contrast. RADIATION DOSE REDUCTION: This exam was performed according to the departmental dose-optimization program which includes automated exposure control, adjustment of the mA and/or kV according to patient size and/or use of iterative reconstruction technique. COMPARISON:  11/17/2023.  Older studies as distant as 11/11/2023. FINDINGS: Brain: No focal abnormality affects the brainstem or cerebellum. Focal clot within the right lateral ventricle persists and is slightly smaller, measuring 13 x 17 mm today. Some layering blood dependent in the atria and occipital horns of the lateral ventricles, less by volume. Ventricular size is stable without developing hydrocephalus. No intraparenchymal hemorrhage. No subdural hematoma. Mild chronic small-vessel ischemic  changes affect the cerebral hemispheric white matter. Patient has a tendency towards benign dural calcification. Vascular: There is atherosclerotic calcification of the major vessels at the base of the brain. Skull: Negative Sinuses/Orbits: Clear/normal Other: None IMPRESSION: Focal clot within the right lateral ventricle persists and is slightly smaller, measuring 13 x 17 mm today. Some layering blood dependent in the atria and occipital horns of the lateral ventricles, less by volume. Ventricular size is stable without developing hydrocephalus. No new or worsening intracranial hemorrhage. Electronically Signed   By: Oneil Officer M.D.   On: 11/19/2023 16:42   CT HEAD WO CONTRAST ( ) Result Date: 11/17/2023 CLINICAL DATA:  Provided history: Stroke, hemorrhagic. Intraventricular hemorrhage. EXAM: CT HEAD WITHOUT CONTRAST TECHNIQUE: Contiguous axial images were obtained from the base of the skull through the vertex without intravenous contrast. RADIATION DOSE REDUCTION: This exam was performed according to the departmental dose-optimization program which includes automated exposure control, adjustment of the mA and/or kV according to patient size and/or use of iterative reconstruction technique. COMPARISON:  Non-contrast head CT and CT angiogram head/neck 11/16/2023. FINDINGS: Brain: Generalized cerebral atrophy. The focus of hemorrhage within the body of the right lateral ventricle has not significantly changed, again measuring 2.3 x 1.2 cm. Layering hemorrhage within the occipital horns of both lateral ventricles, slightly increased. Unchanged size and configuration of the ventricular system. Patchy and ill-defined hypoattenuation within the cerebral white matter, nonspecific but compatible with moderate chronic small vessel ischemic disease. No demarcated cortical infarct. No extra-axial fluid collection. No evidence of an intracranial mass. No midline shift. Vascular: No hyperdense vessel.  Atherosclerotic  calcifications. Skull: No calvarial fracture or aggressive osseous lesion. Sinuses/Orbits: No mass or acute finding within the imaged orbits. Trace mucosal thickening within the left maxillary sinus at the imaged levels. Impression #2 will be called to the ordering clinician or representative by the Radiologist Assistant, and communication  documented in the PACS or [employer]. IMPRESSION: 1. Unchanged focus of hemorrhage within the body of the right lateral ventricle, again measuring 2.3 x 1.2 cm. 2. Layering hemorrhage within the occipital horns of both lateral ventricles, slightly increased. 3. Unchanged size and configuration of the ventricular system. 4. Moderate chronic small vessel ischemic changes within the cerebral white matter. 5. Generalized cerebral atrophy. Electronically Signed   By: Rockey Childs D.O.   On: 11/17/2023 10:52   CT ANGIO HEAD NECK W WO CM Result Date: 11/16/2023 EXAM: CTA HEAD AND NECK WITHOUT AND WITH 11/16/2023 03:43:50 PM TECHNIQUE: CTA of the head and neck was performed without and with the administration of 75 mL of intravenous iohexol  (OMNIPAQUE ) 350 MG/ML contrast. Multiplanar 2D and/or 3D reformatted images are provided for review. Automated exposure control, iterative reconstruction, and/or weight based adjustment of the mA/kV was utilized to reduce the radiation dose to as low as reasonably achievable. Stenosis of the internal carotid arteries measured using NASCET criteria. COMPARISON: Same day MRI head and CT head 11/15/23 and CTA head and neck 02/15/23. CLINICAL HISTORY: Stroke, hemorrhagic; Isolated IVH, likely traumatic due to fall and coagulopathy from Eliquis , evaluate for vascular malformation. FINDINGS: CTA NECK: AORTIC ARCH AND ARCH VESSELS: Mild atherosclerosis of the visualized aortic arch. No dissection or arterial injury. No significant stenosis of the brachiocephalic or subclavian arteries. CERVICAL CAROTID ARTERIES: Calcified atherosclerosis at the right  carotid bifurcation resulting in approximately 50% stenosis of the proximal right cervical ICA, similar to prior. Additional calcified atherosclerosis at the left carotid bifurcation resulting in approximately 50% stenosis of the proximal left cervical ICA, similar to prior. No dissection or arterial injury. CERVICAL VERTEBRAL ARTERIES: The vertebral arteries are patent from the origins to the vertebrobasilar confluence. Severe stenosis at the right vertebral artery origin. No dissection or arterial injury. LUNGS AND MEDIASTINUM: Unremarkable. SOFT TISSUES: No acute abnormality. BONES: Degenerative changes in the spine with disc space narrowing greatest at C5-6 and C6-7. No acute abnormality. CTA HEAD: ANTERIOR CIRCULATION: The intracranial internal carotid arteries appear patent to the ICA termini. There is atherosclerosis of the carotid siphons without evidence of high-grade stenosis. The MCA's are patent proximally. There is limited evaluation of distal MCA branches due to motion artifact. The right A1 segment is hypoplastic. ACA are otherwise patent proximally. Distal ACA branch is not well visualized. No aneurysm. POSTERIOR CIRCULATION: The vertebral artery is patent. Posterior cerebral arteries are patent bilaterally with limited evaluation of the distal branches. No significant stenosis of the basilar artery. No aneurysm. OTHER: No dural venous sinus thrombosis on this non-dedicated study. Focus of intracranial hemorrhage within the body of the right lateral ventricle appears slightly increased compared to the recent prior CT. Trace layering blood products in the occipital horns noted. No midline shift. Nonspecific hypoattenuation in the periventricular and subcortical white matter, most likely representing chronic small vessel disease. Evaluation of intracranial arterial vasculature is limited due to motion artifact. IMPRESSION: 1. Examination limited by motion artifact. Within this limitation, no evidence  of vascular malformation. 2. Calcified atherosclerosis at the carotid bifurcations resulting in approximately 50% stenosis of the proximal cervical ICAs, similar to prior. 3. Severe stenosis at the right vertebral artery origin. 4. Mild atherosclerosis of the visualized aortic arch. Electronically signed by: Donnice Mania MD 11/16/2023 04:12 PM EDT RP Workstation: [id number]   MR BRAIN WO CONTRAST Result Date: 11/16/2023 EXAM: MRI BRAIN WITHOUT CONTRAST 11/16/2023 11:20:00 AM TECHNIQUE: Multiplanar multisequence MRI of the head/brain was performed without the administration of  intravenous contrast. COMPARISON: None available. CLINICAL HISTORY: 73 y.o. male with hx of type 1 diabetes, chronic orthostatic hypotension, paroxysmal atrial fibrillation on Eliquis , seizures, mild cognitive impairment, recent fall, and subdural hematoma in June 2025. Presented to emergency department after a fall with intraventricular hemorrhage. FINDINGS: BRAIN AND VENTRICLES: No acute infarct. No mass. No midline shift. The ventricles are similar in caliber to the prior CT. T2/FLAIR intensity in the periventricular and subcortical white matter suggestive of mild-to-moderate chronic microvascular ischemic changes. Mild volume loss. Chronic microhemorrhage in the left frontal lobe. Additional susceptibility along the falx and tentorium corresponding to areas of mineralization on CT. Focal signal abnormality and susceptibility within the body of the right lateral ventricle corresponding to intraventricular blood products noted on CT. Additional scattered areas of susceptibility within the body of the left lateral ventricle with additional layering susceptibility in the occipital horns compatible with known intraventricular blood products accounting for differences in modality. ORBITS: No acute abnormality. SINUSES AND MASTOIDS: Mild mucosal thickening in the maxillary sinuses. BONES AND SOFT TISSUES: Normal marrow signal. No acute soft  tissue abnormality. IMPRESSION: 1. Intraventricular blood products in the lateral ventricles, similar to recent CT. 2. No midline shift. 3. Similar caliber of the ventricles. 4. Mild-to-moderate chronic microvascular ischemic changes. Mild volume loss. Electronically signed by: Donnice Mania MD 11/16/2023 12:15 PM EDT RP Workstation: [id number]   ECHOCARDIOGRAM COMPLETE Result Date: 11/16/2023    ECHOCARDIOGRAM REPORT   Patient Name:   Nicholas Weiss Date of Exam: 11/16/2023 Medical Rec #:  [id number]       Height:       70.0 in Accession #:    [id number]      Weight:       136.2 lb Date of Birth:  May 27, 1950      BSA:          1.773 m Patient Age:    72 years        BP:           164/86 mmHg Patient Gender: M               HR:           62 bpm. Exam Location:  [facility] Procedure: 2D Echo, Cardiac Doppler and Color Doppler (Both Spectral and Color            Flow Doppler were utilized during procedure). Indications:     Stroke I63.9  History:         Patient has prior history of Echocardiogram examinations, most                  recent 03/10/2023.  Sonographer:     Rosina Dunk Referring Phys:  [id number] [facility] PAUDEL Diagnosing Phys: Lonni Hanson MD  Sonographer Comments: No subcostal window. IMPRESSIONS  1. Left ventricular ejection fraction, by estimation, is 65 to 70%. The left ventricle has normal function. Left ventricular endocardial border not optimally defined to evaluate regional wall motion. There is moderate left ventricular hypertrophy. Left ventricular diastolic function could not be evaluated.  2. Right ventricular systolic function is normal. The right ventricular size is normal.  3. A small pericardial effusion is present.  4. The mitral valve is degenerative. Trivial mitral valve regurgitation. No evidence of mitral stenosis.  5. The aortic valve was not well visualized. There is moderate calcification of the aortic valve. There is moderate thickening of the aortic valve. Aortic valve  regurgitation is not visualized. Aortic valve sclerosis/calcification is present, without  any evidence of aortic stenosis.  6. Rhythm strip during this exam demonstrates atrial fibrillation. FINDINGS  Left Ventricle: Left ventricular ejection fraction, by estimation, is 65 to 70%. The left ventricle has normal function. Left ventricular endocardial border not optimally defined to evaluate regional wall motion. The left ventricular internal cavity size was normal in size. There is moderate left ventricular hypertrophy. Left ventricular diastolic function could not be evaluated due to atrial fibrillation. Left ventricular diastolic function could not be evaluated. Right Ventricle: The right ventricular size is normal. No increase in right ventricular wall thickness. Right ventricular systolic function is normal. Left Atrium: Left atrial size was normal in size. Right Atrium: Right atrial size was normal in size. Pericardium: A small pericardial effusion is present. Mitral Valve: The mitral valve is degenerative in appearance. There is mild thickening of the mitral valve leaflet(s). There is mild calcification of the mitral valve leaflet(s). Trivial mitral valve regurgitation. No evidence of mitral valve stenosis. MV peak gradient, 10.6 mmHg. The mean mitral valve gradient is 4.0 mmHg. Tricuspid Valve: The tricuspid valve is not well visualized. Tricuspid valve regurgitation is trivial. Aortic Valve: The aortic valve was not well visualized. There is moderate calcification of the aortic valve. There is moderate thickening of the aortic valve. Aortic valve regurgitation is not visualized. Aortic valve sclerosis/calcification is present, without any evidence of aortic stenosis. Aortic valve mean gradient measures 3.0 mmHg. Aortic valve peak gradient measures 5.8 mmHg. Aortic valve area, by VTI measures 2.51 cm. Pulmonic Valve: The pulmonic valve was not well visualized. Pulmonic valve regurgitation is not visualized. No  evidence of pulmonic stenosis. Aorta: The aortic root is normal in size and structure. Pulmonary Artery: The pulmonary artery is not well seen. IAS/Shunts: The interatrial septum was not well visualized. EKG: Rhythm strip during this exam demonstrates atrial fibrillation.  LEFT VENTRICLE PLAX 2D LVIDd:         3.60 cm     Diastology LVIDs:         2.00 cm     LV e' medial:    10.00 cm/s LV PW:         1.40 cm     LV E/e' medial:  7.3 LV IVS:        1.20 cm     LV e' lateral:   14.40 cm/s LVOT diam:     2.20 cm     LV E/e' lateral: 5.1 LV SV:         55 LV SV Index:   31 LVOT Area:     3.80 cm  LV Volumes (MOD) LV vol d, MOD A2C: 29.5 ml LV vol d, MOD A4C: 35.2 ml LV vol s, MOD A2C: 11.5 ml LV vol s, MOD A4C: 16.2 ml LV SV MOD A2C:     18.0 ml LV SV MOD A4C:     35.2 ml LV SV MOD BP:      19.8 ml RIGHT VENTRICLE RV Basal diam:  2.70 cm RV Mid diam:    2.40 cm RV S prime:     12.00 cm/s TAPSE (M-mode): 1.7 cm LEFT ATRIUM             Index        RIGHT ATRIUM          Index LA diam:        3.50 cm 1.97 cm/m   RA Area:     9.41 cm LA Vol (A2C):   45.8 ml 25.83 ml/m  RA Volume:   16.70 ml 9.42 ml/m LA Vol (A4C):   52.5 ml 29.61 ml/m LA Biplane Vol: 53.6 ml 30.23 ml/m  AORTIC VALVE                    PULMONIC VALVE AV Area (Vmax):    2.28 cm     PV Vmax:        1.23 m/s AV Area (Vmean):   2.28 cm     PV Vmean:       81.600 cm/s AV Area (VTI):     2.51 cm     PV VTI:         0.197 m AV Vmax:           120.00 cm/s  PV Peak grad:   6.1 mmHg AV Vmean:          78.500 cm/s  PV Mean grad:   3.0 mmHg AV VTI:            0.220 m      RVOT Peak grad: 5 mmHg AV Peak Grad:      5.8 mmHg AV Mean Grad:      3.0 mmHg LVOT Vmax:         71.90 cm/s LVOT Vmean:        47.100 cm/s LVOT VTI:          0.145 m LVOT/AV VTI ratio: 0.66  AORTA Ao Root diam: 3.50 cm MITRAL VALVE MV Area (PHT): 3.33 cm    SHUNTS MV Area VTI:   1.64 cm    Systemic VTI:  0.14 m MV Peak grad:  10.6 mmHg   Systemic Diam: 2.20 cm MV Mean grad:  4.0 mmHg     Pulmonic VTI:  0.165 m MV Vmax:       1.63 m/s MV Vmean:      84.6 cm/s MV Decel Time: 228 msec MV E velocity: 73.10 cm/s Lonni Hanson MD Electronically signed by Lonni Hanson MD Signature Date/Time: 11/16/2023/10:47:56 AM    Final    CT HEAD WO CONTRAST ( ) Result Date: 11/15/2023 EXAM: CT HEAD WITHOUT CONTRAST 11/15/2023 01:39:27 PM TECHNIQUE: CT of the head was performed without the administration of intravenous contrast. Automated exposure control, iterative reconstruction, and/or weight based adjustment of the mA/kV was utilized to reduce the radiation dose to as low as reasonably achievable. COMPARISON: CT head earlier same day. CLINICAL HISTORY: Head trauma, intracranial arterial injury suspected; repeat 6 hour. FINDINGS: BRAIN AND VENTRICLES: There is a similar focus of intraventricular hemorrhage within the body of the right lateral ventricle which is similar to prior. Additional smaller focus of hemorrhage within the body of the left lateral ventricle is slightly decreased in size. There is a small focus of hemorrhage extending into the right occipital horn likely reflecting a redistribution of blood products. The hemorrhage in the left occipital horn seen on prior CT is not well visualized. There are no areas of parenchymal hemorrhage visualized. No midline shift. Similar dural calcifications. Mild probable volume loss. ORBITS: No acute abnormality. SINUSES: No acute abnormality. SOFT TISSUES AND SKULL: No acute soft tissue abnormality. No skull fracture. IMPRESSION: 1. Similar focus of intraventricular hemorrhage within the body of the right lateral ventricle. 2. Additional smaller focus of hemorrhage within the body of the left lateral ventricle, slightly decreased in size. 3. Small focus of hemorrhage in the right occipital horn is new from prior, possibly redistribution of blood products. Electronically signed by: Donnice Mania MD 11/15/2023  02:15 PM EDT RP Workstation: [id number]   CT HEAD  WO CONTRAST ( ) Addendum Date: 11/15/2023 ADDENDUM REPORT: 11/15/2023 08:00 ADDENDUM: Study discussed by telephone with Dr. Ernest in the ED on 11/15/2023 at 0755 hours. Electronically Signed   By: VEAR Hurst M.D.   On: 11/15/2023 08:00   Result Date: 11/15/2023 CLINICAL DATA:  73 year old male with syncope, fall with pain. History of multiple myeloma. EXAM: CT HEAD WITHOUT CONTRAST TECHNIQUE: Contiguous axial images were obtained from the base of the skull through the vertex without intravenous contrast. RADIATION DOSE REDUCTION: This exam was performed according to the departmental dose-optimization program which includes automated exposure control, adjustment of the mA and/or kV according to patient size and/or use of iterative reconstruction technique. COMPARISON:  Recent noncontrast head CT 11/11/2023. Brain MRI 10/18/2022. FINDINGS: Brain: Small volume right greater than left lateral intraventricular hemorrhage (series 3, image 17, coronal image 34) is new. No definite transependymal extension of blood. No ventriculomegaly. Trace layering occipital horn blood on the left is also new series 3, image 14. No other acute intracranial hemorrhage identified. No intracranial mass effect or midline shift. Stable gray-white matter differentiation throughout the brain. No cortically based acute infarct identified. Incidental bulky chronic dural calcifications. Vascular: Calcified atherosclerosis at the skull base. No suspicious intracranial vascular hyperdensity. Skull: Appears stable and intact. No destructive osseous lesion identified. Sinuses/Orbits: Visualized paranasal sinuses and mastoids are stable and well aerated. Other: No acute orbit or scalp soft tissue injury identified. ---------------------------------------------------- Traumatic Brain Injury Risk Stratification Skull Fracture: No - Low/mBIG 1 Subdural Hematoma (SDH): No - Low Subarachnoid Hemorrhage [facility]): No Epidural Hematoma (EDH): No - Low/mBIG 1  Cerebral contusion, intra-axial, intraparenchymal Hemorrhage (IPH): No Intraventricular Hemorrhage (IVH): Yes - High/mBIG 3 Midline Shift > 1mm or Edema/effacement of sulci/vents: No - Low/mBIG 1 ---------------------------------------------------- IMPRESSION: 1. Positive for small volume acute intraventricular hemorrhage, greater on the right and likely posttraumatic. No associated ventriculomegaly. 2. No other acute intracranial abnormality or acute traumatic injury identified. Electronically Signed: By: VEAR Hurst M.D. On: 11/15/2023 07:48   CT Cervical Spine Wo Contrast Result Date: 11/15/2023 CLINICAL DATA:  Syncope with fall.  Left arm pain. EXAM: CT CERVICAL SPINE WITHOUT CONTRAST TECHNIQUE: Multidetector CT imaging of the cervical spine was performed without intravenous contrast. Multiplanar CT image reconstructions were also generated. RADIATION DOSE REDUCTION: This exam was performed according to the departmental dose-optimization program which includes automated exposure control, adjustment of the mA and/or kV according to patient size and/or use of iterative reconstruction technique. COMPARISON:  08/27/2023 FINDINGS: Alignment: Trace degenerative retrolisthesis of C5 on 6 and C6 on 7 with corresponding loss of disc height at both levels. No evidence for traumatic subluxation. Skull base and vertebrae: No acute fracture. No primary bone lesion or focal pathologic process. Soft tissues and spinal canal: No prevertebral fluid or swelling. No visible canal hematoma. Disc levels: Loss of disc height noted C5-6 and C6-7. Facets are well aligned bilaterally with some degenerative left-sided facet disease in the mid cervical levels. Upper chest: Unremarkable. Other: None. IMPRESSION: Degenerative changes in the cervical spine without evidence for an acute fracture or traumatic subluxation. Electronically Signed   By: Camellia Candle M.D.   On: 11/15/2023 07:43   DG Chest Portable 1 View Result Date:  11/15/2023 CLINICAL DATA:  73 year old male with syncope, fall with pain. History of multiple myeloma. EXAM: PORTABLE CHEST 1 VIEW COMPARISON:  Portable chest 11/11/2023. FINDINGS: Portable AP semi upright view at 0621 hours. Partially visible fracture of the  proximal left humerus shaft, medial displacement, this area not included on recent comparisons. Normal lung volumes and mediastinal contours. Visualized tracheal air column is within normal limits. Allowing for portable technique the lungs are clear. Negative visible bowel gas pattern. No other No acute osseous abnormality identified. IMPRESSION: 1. Partially visible displaced proximal left humerus fracture. 2. No acute cardiopulmonary abnormality. Electronically Signed   By: VEAR Hurst M.D.   On: 11/15/2023 06:45   DG Pelvis Portable Result Date: 11/15/2023 CLINICAL DATA:  73 year old male with syncope, fall with pain. History of multiple myeloma. EXAM: PORTABLE PELVIS 1-2 VIEWS COMPARISON:  Abdominal radiographs 08/30/2023 and earlier. FINDINGS: Portable AP supine view at 0620 hours. Pelvis bone mineralization appears stable and within normal limits. Femoral heads appear located. No pelvis fracture identified. Grossly intact proximal femurs. Calcified iliofemoral atherosclerosis. Nonobstructed bowel-gas pattern. IMPRESSION: No acute fracture or dislocation identified about the pelvis. Electronically Signed   By: VEAR Hurst M.D.   On: 11/15/2023 06:43   DG Chest Port 1 View Result Date: 11/11/2023 EXAM: 1 VIEW XRAY OF THE CHEST 11/11/2023 03:33:00 PM COMPARISON: AP radiograph of the chest dated 09/23/2023. CLINICAL HISTORY: Fall trauma. FINDINGS: LUNGS AND PLEURA: No focal pulmonary opacity. No pulmonary edema. No pleural effusion. No pneumothorax. HEART AND MEDIASTINUM: No acute abnormality of the cardiac and mediastinal silhouettes. BONES AND SOFT TISSUES: No acute osseous abnormality. IMPRESSION: 1. No acute process. Electronically signed by: evalene coho 11/11/2023 03:58 PM EDT RP Workstation: [id number]   CT Cervical Spine Wo Contrast Result Date: 11/11/2023 EXAM: CT CERVICAL SPINE WITHOUT CONTRAST 11/11/2023 03:50:08 PM TECHNIQUE: CT of the cervical spine was performed without the administration of intravenous contrast. Multiplanar reformatted images are provided for review. Automated exposure control, iterative reconstruction, and/or weight based adjustment of the mA/kV was utilized to reduce the radiation dose to as low as reasonably achievable. COMPARISON: CT of the cervical spine dated 08/27/2023. CLINICAL HISTORY: Neck pain, acute, no red flags. PT BIB [facility] from home. PT was outside and fell. Fall was witnessed by son who states he hit his head. PT denies hitting head. PT is slightly confused which is not new but no dx of dementia. In the last 12 hrs. PT reportedly pulled out more of the wiring in his insulin  pump. CBG 598 on scene and minimal fluids given. FINDINGS: CERVICAL SPINE: BONES AND ALIGNMENT: No acute fracture or traumatic malalignment. DEGENERATIVE CHANGES: Slight degenerative ventralisthesis at C3-4. Chronic degenerative disc disease at C5-6 and C6-7 with mild central spinal canal stenosis at both levels. SOFT TISSUES: No prevertebral soft tissue swelling. VASCULATURE: Moderate calcification within the carotid bulbs bilaterally. IMPRESSION: 1. No acute abnormality of the cervical spine related to the reported fall. 2. Chronic degenerative disc disease at C5-6 and C6-7 with mild central spinal canal stenosis at both levels. Electronically signed by: evalene coho 11/11/2023 03:57 PM EDT RP Workstation: [id number]   CT Head Wo Contrast Result Date: 11/11/2023 EXAM: CT HEAD WITHOUT CONTRAST 11/11/2023 03:50:08 PM TECHNIQUE: CT of the head was performed without the administration of intravenous contrast. Automated exposure control, iterative reconstruction, and/or weight based adjustment of the mA/kV was utilized to reduce the  radiation dose to as low as reasonably achievable. COMPARISON: CT of the head dated 09/14/2023. CLINICAL HISTORY: Head trauma, moderate-severe. PT BIB [facility] from home. PT was outside and fell. Fall was witnessed by son who states he hit his head. PT denies hitting head. PT is slightly confused which is not new but no dx of dementia. In  the last 12 hrs. PT reportedly pulled out more of the wiring in his insulin  pump. CBG 598 on scene and minimal fluids given. FINDINGS: BRAIN AND VENTRICLES: No acute hemorrhage. Gray-white differentiation is preserved. No hydrocephalus. No extra-axial collection. No mass effect or midline shift. Age-related cerebral volume loss and mild periventricular white matter disease. ORBITS: No acute abnormality. SINUSES: No acute abnormality. SOFT TISSUES AND SKULL: No acute soft tissue abnormality. No skull fracture. Extensive dystrophic calcifications along the falx. Moderate vascular calcifications. The previously described subdural hematoma has resolved. IMPRESSION: 1. No acute intracranial abnormality related to the head trauma. 2. Age-related cerebral volume loss and mild periventricular white matter disease. 3. Extensive dystrophic calcifications along the falx and moderate vascular calcifications. Electronically signed by: evalene coho 11/11/2023 03:54 PM EDT RP Workstation: [id number]    EKG: Narrow complex regular appears to be AV accelerated junctional rhythm rate of 84  ASSESSMENT AND PLAN:  Orthostatic hypotension Syncope Diabetes Seizure disorder Anemia History of intraventricular hemorrhage Mild cognitive impairment . Plan Orthostatic hypotension continue adequate hydration will consider increasing midodrine  if symptoms persist Syncope unclear etiology possibly seizures or alcohol related of true syncope with history of seizure disorder not clear no direct therapy at this point we will consider management Hypertension hypertension labile blood pressures over  200 at time continue conservative therapy ICH stable followed by neurology no change on CT Seizure continue Keppra  therapy Diabetes continue diabetes management with Lantus  NovoLog  Hyperlipidemia continue Lipitor  therapy for lipid management Consider increasing midodrine  to 5 3 times a day if symptoms persist Continue conservative cardiac involvement  Signed: Cara JONETTA Lovelace MD, 11/21/2023, 10:57 AM

## 2023-11-21 NOTE — Plan of Care (Signed)
 Patient remained free from any noted signs of acute distress. No additional medical interventions required at this time.  Patient to continue to be monitored by hospital staff until discharge.

## 2023-11-21 NOTE — Progress Notes (Signed)
 Mobility Specialist - Progress Note     11/21/23 1400  Mobility  Activity Pivoted/transferred from bed to chair;Respositioned in chair;Turned to right side;Turned to left side;Stood at bedside  Level of Assistance Maximum assist, patient does 25-49%  Assistive Device Front wheel walker  Range of Motion/Exercises Active  Activity Response Tolerated well  Mobility Referral Yes  Mobility visit 1 Mobility  Mobility Specialist Start Time (ACUTE ONLY) 1400  Mobility Specialist Stop Time (ACUTE ONLY) 1418  Mobility Specialist Time Calculation (min) (ACUTE ONLY) 18 min   Pt resting in bed on RA upon entry. Pt STS and transferred from bed to chair Mod/MaxA to standing and to standing. Pt had very heavy backward lean and could not keep his self upright without help. Pt moved to recliner and sat down in chair. Pt left in recliner with needs in reach and chair alarm activated.   Guido Rumble Mobility Specialist 11/21/23, 2:36 PM

## 2023-11-22 DIAGNOSIS — I615 Nontraumatic intracerebral hemorrhage, intraventricular: Secondary | ICD-10-CM | POA: Diagnosis not present

## 2023-11-22 DIAGNOSIS — E440 Moderate protein-calorie malnutrition: Secondary | ICD-10-CM | POA: Diagnosis present

## 2023-11-22 DIAGNOSIS — Z7189 Other specified counseling: Secondary | ICD-10-CM | POA: Diagnosis not present

## 2023-11-22 LAB — GLUCOSE, CAPILLARY
Glucose-Capillary: 71 mg/dL (ref 70–99)
Glucose-Capillary: 338 mg/dL — ABNORMAL HIGH (ref 70–99)
Glucose-Capillary: 385 mg/dL — ABNORMAL HIGH (ref 70–99)
Glucose-Capillary: 155 mg/dL — ABNORMAL HIGH (ref 70–99)

## 2023-11-22 MED ORDER — ADULT MULTIVITAMIN W/MINERALS CH
1.0000 | ORAL_TABLET | Freq: Every day | ORAL | Status: DC
  Administered 2023-11-22 – 2023-11-26 (×5): 1 via ORAL
  Filled 2023-11-22 (×5): qty 1

## 2023-11-22 MED ORDER — GLUCERNA SHAKE PO LIQD
237.0000 mL | Freq: Three times a day (TID) | ORAL | Status: DC
  Administered 2023-11-22 – 2023-11-24 (×6): 237 mL via ORAL

## 2023-11-22 NOTE — Progress Notes (Signed)
 [facility] CLINIC CARDIOLOGY PROGRESS NOTE       Patient ID: Nicholas Weiss MRN: [id number] DOB/AGE: Sep 26, 1950 73 y.o.  Admit date: 11/15/2023 Referring Physician Dr. Baron Primary Physician Alla Amis, MD Primary Cardiologist None Reason for Consultation possible syncope/orthostatic hypotension  HPI: Nicholas Weiss is a 73 y.o. male  with a past medical history of recent history of fall with subdural hematoma (08/2023), chronic orthostatic hypotension (on midodrine  5 mg 3 times daily), atrial fibrillation (on Eliquis ), seizure disorder, mild cognitive impairment, alcohol use, type 1 diabetes mellitus, hyperlipidemia who presented to the ED on 11/15/2023 for frequent falls and recent LOC.  He is confused at baseline at home.  Cardiology was consulted for further evaluation of labile blood pressure and orthostatic hypotension.  Interval History: -Patient seen and examined this AM and laying comfortably in hospital bed with covers over her head.  Asked patient to remove if she covers.  Patient states he feels fine and is only oriented to person. Denies chest pain, palpitations, SOB or lightheadedness.  -Patients BP improving and HR stable this AM. -Cr improved s/p IVFs -Patient remains on room air with stable SpO2.    Review of systems complete and found to be negative unless listed above    Past Medical History:  Diagnosis Date   Acute metabolic encephalopathy 12/02/2020   DKA (diabetic ketoacidoses) 04/06/2016   Hypercholesteremia    Hypertension     Past Surgical History:  Procedure Laterality Date   AMPUTATION TOE Left 04/25/2022   Procedure: LEFT GREAT TOE AMPUTATION AND LEFT PARTIAL 2ND TOE AMPUTATION;  Surgeon: Janit Thresa HERO, DPM;  Location: [facility] ORS;  Service: Podiatry;  Laterality: Left;   COLONOSCOPY WITH PROPOFOL  N/A 02/01/2020   Procedure: COLONOSCOPY WITH PROPOFOL ;  Surgeon: Unk Corinn Skiff, MD;  Location: [facility] ENDOSCOPY;  Service: Gastroenterology;   Laterality: N/A;   ESOPHAGOGASTRODUODENOSCOPY  02/01/2020   Procedure: ESOPHAGOGASTRODUODENOSCOPY (EGD);  Surgeon: Unk Corinn Skiff, MD;  Location: [facility] ENDOSCOPY;  Service: Gastroenterology;;   ESOPHAGOGASTRODUODENOSCOPY (EGD) WITH PROPOFOL  N/A 06/07/2022   Procedure: ESOPHAGOGASTRODUODENOSCOPY (EGD) WITH PROPOFOL ;  Surgeon: Saintclair Jasper, MD;  Location: [facility] ENDOSCOPY;  Service: Gastroenterology;  Laterality: N/A;   KNEE SURGERY Right    Torn meniscus   KNEE SURGERY Left     Medications Prior to Admission  Medication Sig Dispense Refill Last Dose/Taking   acetaminophen  (TYLENOL ) 325 MG tablet Take 2 tablets (650 mg total) by mouth every 6 (six) hours as needed for mild pain (pain score 1-3) or fever.   Taking As Needed   acyclovir  (ZOVIRAX ) 400 MG tablet Take 400 mg by mouth in the morning and at bedtime.   11/14/2023   apixaban  (ELIQUIS ) 5 MG TABS tablet Take 5 mg by mouth 2 (two) times daily.   11/14/2023   atorvastatin  (LIPITOR ) 80 MG tablet Take 1 tablet (80 mg total) by mouth at bedtime. Hold while taking Paxlovid  30 tablet 2 11/14/2023   fludrocortisone  (FLORINEF ) 0.1 MG tablet Take 1 tablet (0.1 mg total) by mouth daily as needed. Skip the dose if SBP >130 (Patient taking differently: Take 0.1 mg by mouth daily. Skip the dose if SBP >130)   11/14/2023   gabapentin  (NEURONTIN ) 300 MG capsule Take 1 capsule (300 mg total) by mouth 2 (two) times daily. 60 capsule 0 11/14/2023   insulin  lispro (HUMALOG) 100 UNIT/ML injection INJECT 150 UNITS EVERY 3 DAYS VIA INSULIN  PUMP AS DIRECTED   11/14/2023   latanoprost  (XALATAN ) 0.005 % ophthalmic solution 1 drop at bedtime.  11/14/2023   levETIRAcetam  (KEPPRA ) 500 MG tablet Take 1 tablet by mouth twice daily 60 tablet 0 11/14/2023   midodrine  (PROAMATINE ) 5 MG tablet Take 1 tablet (5 mg total) by mouth 3 (three) times daily. 90 tablet 0 11/14/2023   oxyCODONE -acetaminophen  (PERCOCET/ROXICET) 5-325 MG tablet Take 1 tablet by mouth every 6 (six) hours as needed  for severe pain (pain score 7-10).   Taking As Needed   pomalidomide  (POMALYST ) 2 MG capsule Take 1 capsule (2 mg total) by mouth daily. Take for 21 days, the hold for 7 days. Repeat every 28 days. 21 capsule 0 11/14/2023   Continuous Glucose Sensor (DEXCOM G7 SENSOR) MISC 1 each by Does not apply route 4 (four) times daily -  before meals and at bedtime. 1 each 0    iron  polysaccharides (NIFEREX) 150 MG capsule Take 1 capsule (150 mg total) by mouth daily. (Patient not taking: Reported on 11/11/2023)      Social History   Socioeconomic History   Marital status: Single    Spouse name: Not on file   Number of children: 3   Years of education: Not on file   Highest education level: High school graduate  Occupational History    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  Tobacco Use   Smoking status: Never   Smokeless tobacco: Never  Vaping Use   Vaping status: Never Used  Substance and Sexual Activity   Alcohol use: Not Currently    Alcohol/week: 0.0 - 1.0 standard drinks of alcohol    Comment: once every 2 months   Drug use: Yes    Types: Marijuana, Crack cocaine    Comment: last week   Sexual activity: Not Currently    Birth control/protection: None  Other Topics Concern   Not on file  Social History Narrative   Lives with girlfriend sharon   Social Drivers of Health   Financial Resource Strain: Low Risk  (10/20/2023)   Received from [facility]   Overall Financial Resource Strain (CARDIA)    Difficulty of Paying Living Expenses: Not hard at all  Food Insecurity: No Food Insecurity (11/16/2023)   Hunger Vital Sign    Worried About Running Out of Food in the Last Year: Never true    Ran Out of Food in the Last Year: Never true  Transportation Needs: No Transportation Needs (11/16/2023)   PRAPARE - Administrator, Civil Service (Medical): No    Lack of Transportation (Non-Medical): No  Physical Activity: Insufficiently Active (01/01/2020)   Exercise  Vital Sign    Days of Exercise per Week: 7 days    Minutes of Exercise per Session: 20 min  Stress: No Stress Concern Present (01/01/2020)   [employer] of Occupational Health - Occupational Stress Questionnaire    Feeling of Stress : Not at all  Social Connections: Moderately Isolated (11/16/2023)   Social Connection and Isolation Panel    Frequency of Communication with Friends and Family: More than three times a week    Frequency of Social Gatherings with Friends and Family: More than three times a week    Attends Religious Services: Never    Database administrator or Organizations: No    Attends Banker Meetings: Never    Marital Status: Living with partner  Intimate Partner Violence: Not At Risk (11/16/2023)   Humiliation, Afraid, Rape, and Kick questionnaire    Fear of Current or Ex-Partner: No    Emotionally Abused: No  Physically Abused: No    Sexually Abused: No    Family History  Problem Relation Age of Onset   Heart attack Father    Hypertension Sister    Cancer Sister      Vitals:   11/21/23 2005 11/21/23 2202 11/22/23 0447 11/22/23 0818  BP: 97/65 136/89 (!) 101/59 (!) 132/90  Pulse: 87 83 79 88  Resp: 17 18  19   Temp: 97.7 F (36.5 C) (!) 97.4 F (36.3 C) (!) 97.5 F (36.4 C) 97.8 F (36.6 C)  TempSrc:    Oral  SpO2: 100% 100% 100% 100%  Weight:      Height:        PHYSICAL EXAM General: Well-appearing elderly male, well nourished, in no acute distress. HEENT: Normocephalic and atraumatic. Neck: No JVD.   Lungs: Normal respiratory effort on room air. Clear bilaterally to auscultation. No wheezes, crackles, rhonchi.  Heart: HRRR. Normal S1 and S2 without gallops or murmurs.  Abdomen: Non-distended appearing.  Msk: Normal strength and tone for age. Extremities: Warm and well perfused. No clubbing, cyanosis, edema.  Neuro: Alert and oriented to person only. Psych: Answers questions appropriately.   Labs: Basic Metabolic  Panel: Recent Labs    11/20/23 0523 11/21/23 0612  NA 137 136  K 4.2 4.5  CL 105 104  CO2 28 26  GLUCOSE 54* 124*  BUN 23 29*  CREATININE 1.25* 1.06  CALCIUM  8.4* 8.2*   Liver Function Tests: No results for input(s): AST, ALT, ALKPHOS, BILITOT, PROT, ALBUMIN in the last 72 hours. No results for input(s): LIPASE, AMYLASE in the last 72 hours. CBC: Recent Labs    11/20/23 0523 11/21/23 0612  WBC 5.3 5.2  HGB 8.4* 9.6*  HCT 26.3* 29.7*  MCV 98.5 98.0  PLT 247 249   Cardiac Enzymes: No results for input(s): CKTOTAL, CKMB, CKMBINDEX, TROPONINIHS in the last 72 hours. BNP: No results for input(s): BNP in the last 72 hours. D-Dimer: No results for input(s): DDIMER in the last 72 hours. Hemoglobin A1C: No results for input(s): HGBA1C in the last 72 hours. Fasting Lipid Panel: No results for input(s): CHOL, HDL, LDLCALC, TRIG, CHOLHDL, LDLDIRECT in the last 72 hours. Thyroid  Function Tests: No results for input(s): TSH, T4TOTAL, T3FREE, THYROIDAB in the last 72 hours.  Invalid input(s): FREET3 Anemia Panel: No results for input(s): VITAMINB12, FOLATE, FERRITIN, TIBC, IRON , RETICCTPCT in the last 72 hours.   Radiology: CT HEAD CODE STROKE WO CONTRAST` Result Date: 11/20/2023 CLINICAL DATA:  Provided history: Neuro deficit, acute, stroke suspected. EXAM: CT HEAD WITHOUT CONTRAST TECHNIQUE: Contiguous axial images were obtained from the base of the skull through the vertex without intravenous contrast. RADIATION DOSE REDUCTION: This exam was performed according to the departmental dose-optimization program which includes automated exposure control, adjustment of the mA and/or kV according to patient size and/or use of iterative reconstruction technique. COMPARISON:  Head CT 11/19/2023. FINDINGS: Brain: Generalized cerebral atrophy. A focus of hemorrhage within the body of the right lateral ventricle is unchanged as  compared to yesterday's head CT, again measuring 1.7 x 1.3 cm in transaxial dimensions. Small-volume hemorrhage within the occipital horns of both lateral ventricles, also unchanged. Stable size and configuration of the ventricular system. No evidence of an interval acute intracranial hemorrhage. Patchy and ill-defined hypoattenuation within the cerebral white matter, nonspecific but compatible with moderate chronic small vessel ischemic disease. Prominent dural calcifications. No demarcated cortical infarct. No extra-axial fluid collection. No midline shift. Vascular: No hyperdense vessel.  Atherosclerotic calcifications.  Skull: No calvarial fracture or aggressive osseous lesion. Sinuses/Orbits: No mass or acute finding within the imaged orbits. Minimal mucosal thickening within the right maxillary sinus at the imaged levels. Tiny mucous retention cyst within the left maxillary sinus at the imaged levels. No evidence of an interval acute intracranially as compared yesterday's head CT. These results were communicated to Dr. Voncile at 3:48 pmon 8/23/2025by text page via the [facility] messaging system. IMPRESSION: 1. 1.7 x 1.3 cm focus of hemorrhage within the body of the right lateral ventricle, unchanged from yesterday's head CT. 2. Small-volume hemorrhage within the occipital horns of both lateral ventricles, also unchanged. 3. Stable size and configuration of the ventricular system. 4. No evidence of an interval acute intracranial hemorrhage. 5. Moderate chronic small vessel ischemic changes within the cerebral white matter. 6. Generalized cerebral atrophy Electronically Signed   By: Rockey Childs D.O.   On: 11/20/2023 15:48   CT HEAD WO CONTRAST ( ) Result Date: 11/19/2023 CLINICAL DATA:  Mental status change. Follow-up intracranial hemorrhage EXAM: CT HEAD WITHOUT CONTRAST TECHNIQUE: Contiguous axial images were obtained from the base of the skull through the vertex without intravenous contrast. RADIATION DOSE  REDUCTION: This exam was performed according to the departmental dose-optimization program which includes automated exposure control, adjustment of the mA and/or kV according to patient size and/or use of iterative reconstruction technique. COMPARISON:  11/17/2023.  Older studies as distant as 11/11/2023. FINDINGS: Brain: No focal abnormality affects the brainstem or cerebellum. Focal clot within the right lateral ventricle persists and is slightly smaller, measuring 13 x 17 mm today. Some layering blood dependent in the atria and occipital horns of the lateral ventricles, less by volume. Ventricular size is stable without developing hydrocephalus. No intraparenchymal hemorrhage. No subdural hematoma. Mild chronic small-vessel ischemic changes affect the cerebral hemispheric white matter. Patient has a tendency towards benign dural calcification. Vascular: There is atherosclerotic calcification of the major vessels at the base of the brain. Skull: Negative Sinuses/Orbits: Clear/normal Other: None IMPRESSION: Focal clot within the right lateral ventricle persists and is slightly smaller, measuring 13 x 17 mm today. Some layering blood dependent in the atria and occipital horns of the lateral ventricles, less by volume. Ventricular size is stable without developing hydrocephalus. No new or worsening intracranial hemorrhage. Electronically Signed   By: Oneil Officer M.D.   On: 11/19/2023 16:42   CT HEAD WO CONTRAST ( ) Result Date: 11/17/2023 CLINICAL DATA:  Provided history: Stroke, hemorrhagic. Intraventricular hemorrhage. EXAM: CT HEAD WITHOUT CONTRAST TECHNIQUE: Contiguous axial images were obtained from the base of the skull through the vertex without intravenous contrast. RADIATION DOSE REDUCTION: This exam was performed according to the departmental dose-optimization program which includes automated exposure control, adjustment of the mA and/or kV according to patient size and/or use of iterative  reconstruction technique. COMPARISON:  Non-contrast head CT and CT angiogram head/neck 11/16/2023. FINDINGS: Brain: Generalized cerebral atrophy. The focus of hemorrhage within the body of the right lateral ventricle has not significantly changed, again measuring 2.3 x 1.2 cm. Layering hemorrhage within the occipital horns of both lateral ventricles, slightly increased. Unchanged size and configuration of the ventricular system. Patchy and ill-defined hypoattenuation within the cerebral white matter, nonspecific but compatible with moderate chronic small vessel ischemic disease. No demarcated cortical infarct. No extra-axial fluid collection. No evidence of an intracranial mass. No midline shift. Vascular: No hyperdense vessel.  Atherosclerotic calcifications. Skull: No calvarial fracture or aggressive osseous lesion. Sinuses/Orbits: No mass or acute finding within the imaged orbits. Trace  mucosal thickening within the left maxillary sinus at the imaged levels. Impression #2 will be called to the ordering clinician or representative by the Radiologist Assistant, and communication documented in the PACS or [employer]. IMPRESSION: 1. Unchanged focus of hemorrhage within the body of the right lateral ventricle, again measuring 2.3 x 1.2 cm. 2. Layering hemorrhage within the occipital horns of both lateral ventricles, slightly increased. 3. Unchanged size and configuration of the ventricular system. 4. Moderate chronic small vessel ischemic changes within the cerebral white matter. 5. Generalized cerebral atrophy. Electronically Signed   By: Rockey Childs D.O.   On: 11/17/2023 10:52   CT ANGIO HEAD NECK W WO CM Result Date: 11/16/2023 EXAM: CTA HEAD AND NECK WITHOUT AND WITH 11/16/2023 03:43:50 PM TECHNIQUE: CTA of the head and neck was performed without and with the administration of 75 mL of intravenous iohexol  (OMNIPAQUE ) 350 MG/ML contrast. Multiplanar 2D and/or 3D reformatted images are provided for review.  Automated exposure control, iterative reconstruction, and/or weight based adjustment of the mA/kV was utilized to reduce the radiation dose to as low as reasonably achievable. Stenosis of the internal carotid arteries measured using NASCET criteria. COMPARISON: Same day MRI head and CT head 11/15/23 and CTA head and neck 02/15/23. CLINICAL HISTORY: Stroke, hemorrhagic; Isolated IVH, likely traumatic due to fall and coagulopathy from Eliquis , evaluate for vascular malformation. FINDINGS: CTA NECK: AORTIC ARCH AND ARCH VESSELS: Mild atherosclerosis of the visualized aortic arch. No dissection or arterial injury. No significant stenosis of the brachiocephalic or subclavian arteries. CERVICAL CAROTID ARTERIES: Calcified atherosclerosis at the right carotid bifurcation resulting in approximately 50% stenosis of the proximal right cervical ICA, similar to prior. Additional calcified atherosclerosis at the left carotid bifurcation resulting in approximately 50% stenosis of the proximal left cervical ICA, similar to prior. No dissection or arterial injury. CERVICAL VERTEBRAL ARTERIES: The vertebral arteries are patent from the origins to the vertebrobasilar confluence. Severe stenosis at the right vertebral artery origin. No dissection or arterial injury. LUNGS AND MEDIASTINUM: Unremarkable. SOFT TISSUES: No acute abnormality. BONES: Degenerative changes in the spine with disc space narrowing greatest at C5-6 and C6-7. No acute abnormality. CTA HEAD: ANTERIOR CIRCULATION: The intracranial internal carotid arteries appear patent to the ICA termini. There is atherosclerosis of the carotid siphons without evidence of high-grade stenosis. The MCA's are patent proximally. There is limited evaluation of distal MCA branches due to motion artifact. The right A1 segment is hypoplastic. ACA are otherwise patent proximally. Distal ACA branch is not well visualized. No aneurysm. POSTERIOR CIRCULATION: The vertebral artery is patent.  Posterior cerebral arteries are patent bilaterally with limited evaluation of the distal branches. No significant stenosis of the basilar artery. No aneurysm. OTHER: No dural venous sinus thrombosis on this non-dedicated study. Focus of intracranial hemorrhage within the body of the right lateral ventricle appears slightly increased compared to the recent prior CT. Trace layering blood products in the occipital horns noted. No midline shift. Nonspecific hypoattenuation in the periventricular and subcortical white matter, most likely representing chronic small vessel disease. Evaluation of intracranial arterial vasculature is limited due to motion artifact. IMPRESSION: 1. Examination limited by motion artifact. Within this limitation, no evidence of vascular malformation. 2. Calcified atherosclerosis at the carotid bifurcations resulting in approximately 50% stenosis of the proximal cervical ICAs, similar to prior. 3. Severe stenosis at the right vertebral artery origin. 4. Mild atherosclerosis of the visualized aortic arch. Electronically signed by: Donnice Mania MD 11/16/2023 04:12 PM EDT RP Workstation: [id number]  MR BRAIN WO CONTRAST Result Date: 11/16/2023 EXAM: MRI BRAIN WITHOUT CONTRAST 11/16/2023 11:20:00 AM TECHNIQUE: Multiplanar multisequence MRI of the head/brain was performed without the administration of intravenous contrast. COMPARISON: None available. CLINICAL HISTORY: 73 y.o. male with hx of type 1 diabetes, chronic orthostatic hypotension, paroxysmal atrial fibrillation on Eliquis , seizures, mild cognitive impairment, recent fall, and subdural hematoma in June 2025. Presented to emergency department after a fall with intraventricular hemorrhage. FINDINGS: BRAIN AND VENTRICLES: No acute infarct. No mass. No midline shift. The ventricles are similar in caliber to the prior CT. T2/FLAIR intensity in the periventricular and subcortical white matter suggestive of mild-to-moderate chronic microvascular  ischemic changes. Mild volume loss. Chronic microhemorrhage in the left frontal lobe. Additional susceptibility along the falx and tentorium corresponding to areas of mineralization on CT. Focal signal abnormality and susceptibility within the body of the right lateral ventricle corresponding to intraventricular blood products noted on CT. Additional scattered areas of susceptibility within the body of the left lateral ventricle with additional layering susceptibility in the occipital horns compatible with known intraventricular blood products accounting for differences in modality. ORBITS: No acute abnormality. SINUSES AND MASTOIDS: Mild mucosal thickening in the maxillary sinuses. BONES AND SOFT TISSUES: Normal marrow signal. No acute soft tissue abnormality. IMPRESSION: 1. Intraventricular blood products in the lateral ventricles, similar to recent CT. 2. No midline shift. 3. Similar caliber of the ventricles. 4. Mild-to-moderate chronic microvascular ischemic changes. Mild volume loss. Electronically signed by: Donnice Mania MD 11/16/2023 12:15 PM EDT RP Workstation: [id number]   ECHOCARDIOGRAM COMPLETE Result Date: 11/16/2023    ECHOCARDIOGRAM REPORT   Patient Name:   Nicholas Weiss Date of Exam: 11/16/2023 Medical Rec #:  [id number]       Height:       70.0 in Accession #:    [id number]      Weight:       136.2 lb Date of Birth:  11/08/1950      BSA:          1.773 m Patient Age:    72 years        BP:           164/86 mmHg Patient Gender: M               HR:           62 bpm. Exam Location:  [facility] Procedure: 2D Echo, Cardiac Doppler and Color Doppler (Both Spectral and Color            Flow Doppler were utilized during procedure). Indications:     Stroke I63.9  History:         Patient has prior history of Echocardiogram examinations, most                  recent 03/10/2023.  Sonographer:     Rosina Dunk Referring Phys:  [id number] [facility] PAUDEL Diagnosing Phys: Lonni Hanson MD  Sonographer  Comments: No subcostal window. IMPRESSIONS  1. Left ventricular ejection fraction, by estimation, is 65 to 70%. The left ventricle has normal function. Left ventricular endocardial border not optimally defined to evaluate regional wall motion. There is moderate left ventricular hypertrophy. Left ventricular diastolic function could not be evaluated.  2. Right ventricular systolic function is normal. The right ventricular size is normal.  3. A small pericardial effusion is present.  4. The mitral valve is degenerative. Trivial mitral valve regurgitation. No evidence of mitral stenosis.  5. The aortic valve was not well visualized.  There is moderate calcification of the aortic valve. There is moderate thickening of the aortic valve. Aortic valve regurgitation is not visualized. Aortic valve sclerosis/calcification is present, without any evidence of aortic stenosis.  6. Rhythm strip during this exam demonstrates atrial fibrillation. FINDINGS  Left Ventricle: Left ventricular ejection fraction, by estimation, is 65 to 70%. The left ventricle has normal function. Left ventricular endocardial border not optimally defined to evaluate regional wall motion. The left ventricular internal cavity size was normal in size. There is moderate left ventricular hypertrophy. Left ventricular diastolic function could not be evaluated due to atrial fibrillation. Left ventricular diastolic function could not be evaluated. Right Ventricle: The right ventricular size is normal. No increase in right ventricular wall thickness. Right ventricular systolic function is normal. Left Atrium: Left atrial size was normal in size. Right Atrium: Right atrial size was normal in size. Pericardium: A small pericardial effusion is present. Mitral Valve: The mitral valve is degenerative in appearance. There is mild thickening of the mitral valve leaflet(s). There is mild calcification of the mitral valve leaflet(s). Trivial mitral valve regurgitation. No  evidence of mitral valve stenosis. MV peak gradient, 10.6 mmHg. The mean mitral valve gradient is 4.0 mmHg. Tricuspid Valve: The tricuspid valve is not well visualized. Tricuspid valve regurgitation is trivial. Aortic Valve: The aortic valve was not well visualized. There is moderate calcification of the aortic valve. There is moderate thickening of the aortic valve. Aortic valve regurgitation is not visualized. Aortic valve sclerosis/calcification is present, without any evidence of aortic stenosis. Aortic valve mean gradient measures 3.0 mmHg. Aortic valve peak gradient measures 5.8 mmHg. Aortic valve area, by VTI measures 2.51 cm. Pulmonic Valve: The pulmonic valve was not well visualized. Pulmonic valve regurgitation is not visualized. No evidence of pulmonic stenosis. Aorta: The aortic root is normal in size and structure. Pulmonary Artery: The pulmonary artery is not well seen. IAS/Shunts: The interatrial septum was not well visualized. EKG: Rhythm strip during this exam demonstrates atrial fibrillation.  LEFT VENTRICLE PLAX 2D LVIDd:         3.60 cm     Diastology LVIDs:         2.00 cm     LV e' medial:    10.00 cm/s LV PW:         1.40 cm     LV E/e' medial:  7.3 LV IVS:        1.20 cm     LV e' lateral:   14.40 cm/s LVOT diam:     2.20 cm     LV E/e' lateral: 5.1 LV SV:         55 LV SV Index:   31 LVOT Area:     3.80 cm  LV Volumes (MOD) LV vol d, MOD A2C: 29.5 ml LV vol d, MOD A4C: 35.2 ml LV vol s, MOD A2C: 11.5 ml LV vol s, MOD A4C: 16.2 ml LV SV MOD A2C:     18.0 ml LV SV MOD A4C:     35.2 ml LV SV MOD BP:      19.8 ml RIGHT VENTRICLE RV Basal diam:  2.70 cm RV Mid diam:    2.40 cm RV S prime:     12.00 cm/s TAPSE (M-mode): 1.7 cm LEFT ATRIUM             Index        RIGHT ATRIUM          Index LA diam:  3.50 cm 1.97 cm/m   RA Area:     9.41 cm LA Vol (A2C):   45.8 ml 25.83 ml/m  RA Volume:   16.70 ml 9.42 ml/m LA Vol (A4C):   52.5 ml 29.61 ml/m LA Biplane Vol: 53.6 ml 30.23 ml/m  AORTIC  VALVE                    PULMONIC VALVE AV Area (Vmax):    2.28 cm     PV Vmax:        1.23 m/s AV Area (Vmean):   2.28 cm     PV Vmean:       81.600 cm/s AV Area (VTI):     2.51 cm     PV VTI:         0.197 m AV Vmax:           120.00 cm/s  PV Peak grad:   6.1 mmHg AV Vmean:          78.500 cm/s  PV Mean grad:   3.0 mmHg AV VTI:            0.220 m      RVOT Peak grad: 5 mmHg AV Peak Grad:      5.8 mmHg AV Mean Grad:      3.0 mmHg LVOT Vmax:         71.90 cm/s LVOT Vmean:        47.100 cm/s LVOT VTI:          0.145 m LVOT/AV VTI ratio: 0.66  AORTA Ao Root diam: 3.50 cm MITRAL VALVE MV Area (PHT): 3.33 cm    SHUNTS MV Area VTI:   1.64 cm    Systemic VTI:  0.14 m MV Peak grad:  10.6 mmHg   Systemic Diam: 2.20 cm MV Mean grad:  4.0 mmHg    Pulmonic VTI:  0.165 m MV Vmax:       1.63 m/s MV Vmean:      84.6 cm/s MV Decel Time: 228 msec MV E velocity: 73.10 cm/s Lonni Hanson MD Electronically signed by Lonni Hanson MD Signature Date/Time: 11/16/2023/10:47:56 AM    Final    CT HEAD WO CONTRAST ( ) Result Date: 11/15/2023 EXAM: CT HEAD WITHOUT CONTRAST 11/15/2023 01:39:27 PM TECHNIQUE: CT of the head was performed without the administration of intravenous contrast. Automated exposure control, iterative reconstruction, and/or weight based adjustment of the mA/kV was utilized to reduce the radiation dose to as low as reasonably achievable. COMPARISON: CT head earlier same day. CLINICAL HISTORY: Head trauma, intracranial arterial injury suspected; repeat 6 hour. FINDINGS: BRAIN AND VENTRICLES: There is a similar focus of intraventricular hemorrhage within the body of the right lateral ventricle which is similar to prior. Additional smaller focus of hemorrhage within the body of the left lateral ventricle is slightly decreased in size. There is a small focus of hemorrhage extending into the right occipital horn likely reflecting a redistribution of blood products. The hemorrhage in the left occipital horn seen on  prior CT is not well visualized. There are no areas of parenchymal hemorrhage visualized. No midline shift. Similar dural calcifications. Mild probable volume loss. ORBITS: No acute abnormality. SINUSES: No acute abnormality. SOFT TISSUES AND SKULL: No acute soft tissue abnormality. No skull fracture. IMPRESSION: 1. Similar focus of intraventricular hemorrhage within the body of the right lateral ventricle. 2. Additional smaller focus of hemorrhage within the body of the left lateral ventricle, slightly decreased in size. 3.  Small focus of hemorrhage in the right occipital horn is new from prior, possibly redistribution of blood products. Electronically signed by: Donnice Mania MD 11/15/2023 02:15 PM EDT RP Workstation: [id number]   CT HEAD WO CONTRAST ( ) Addendum Date: 11/15/2023 ADDENDUM REPORT: 11/15/2023 08:00 ADDENDUM: Study discussed by telephone with Dr. Ernest in the ED on 11/15/2023 at 0755 hours. Electronically Signed   By: VEAR Hurst M.D.   On: 11/15/2023 08:00   Result Date: 11/15/2023 CLINICAL DATA:  73 year old male with syncope, fall with pain. History of multiple myeloma. EXAM: CT HEAD WITHOUT CONTRAST TECHNIQUE: Contiguous axial images were obtained from the base of the skull through the vertex without intravenous contrast. RADIATION DOSE REDUCTION: This exam was performed according to the departmental dose-optimization program which includes automated exposure control, adjustment of the mA and/or kV according to patient size and/or use of iterative reconstruction technique. COMPARISON:  Recent noncontrast head CT 11/11/2023. Brain MRI 10/18/2022. FINDINGS: Brain: Small volume right greater than left lateral intraventricular hemorrhage (series 3, image 17, coronal image 34) is new. No definite transependymal extension of blood. No ventriculomegaly. Trace layering occipital horn blood on the left is also new series 3, image 14. No other acute intracranial hemorrhage identified. No intracranial mass  effect or midline shift. Stable gray-white matter differentiation throughout the brain. No cortically based acute infarct identified. Incidental bulky chronic dural calcifications. Vascular: Calcified atherosclerosis at the skull base. No suspicious intracranial vascular hyperdensity. Skull: Appears stable and intact. No destructive osseous lesion identified. Sinuses/Orbits: Visualized paranasal sinuses and mastoids are stable and well aerated. Other: No acute orbit or scalp soft tissue injury identified. ---------------------------------------------------- Traumatic Brain Injury Risk Stratification Skull Fracture: No - Low/mBIG 1 Subdural Hematoma (SDH): No - Low Subarachnoid Hemorrhage [facility]): No Epidural Hematoma (EDH): No - Low/mBIG 1 Cerebral contusion, intra-axial, intraparenchymal Hemorrhage (IPH): No Intraventricular Hemorrhage (IVH): Yes - High/mBIG 3 Midline Shift > 1mm or Edema/effacement of sulci/vents: No - Low/mBIG 1 ---------------------------------------------------- IMPRESSION: 1. Positive for small volume acute intraventricular hemorrhage, greater on the right and likely posttraumatic. No associated ventriculomegaly. 2. No other acute intracranial abnormality or acute traumatic injury identified. Electronically Signed: By: VEAR Hurst M.D. On: 11/15/2023 07:48   CT Cervical Spine Wo Contrast Result Date: 11/15/2023 CLINICAL DATA:  Syncope with fall.  Left arm pain. EXAM: CT CERVICAL SPINE WITHOUT CONTRAST TECHNIQUE: Multidetector CT imaging of the cervical spine was performed without intravenous contrast. Multiplanar CT image reconstructions were also generated. RADIATION DOSE REDUCTION: This exam was performed according to the departmental dose-optimization program which includes automated exposure control, adjustment of the mA and/or kV according to patient size and/or use of iterative reconstruction technique. COMPARISON:  08/27/2023 FINDINGS: Alignment: Trace degenerative retrolisthesis of C5 on  6 and C6 on 7 with corresponding loss of disc height at both levels. No evidence for traumatic subluxation. Skull base and vertebrae: No acute fracture. No primary bone lesion or focal pathologic process. Soft tissues and spinal canal: No prevertebral fluid or swelling. No visible canal hematoma. Disc levels: Loss of disc height noted C5-6 and C6-7. Facets are well aligned bilaterally with some degenerative left-sided facet disease in the mid cervical levels. Upper chest: Unremarkable. Other: None. IMPRESSION: Degenerative changes in the cervical spine without evidence for an acute fracture or traumatic subluxation. Electronically Signed   By: Camellia Candle M.D.   On: 11/15/2023 07:43   DG Chest Portable 1 View Result Date: 11/15/2023 CLINICAL DATA:  73 year old male with syncope, fall with pain. History of multiple myeloma.  EXAM: PORTABLE CHEST 1 VIEW COMPARISON:  Portable chest 11/11/2023. FINDINGS: Portable AP semi upright view at 0621 hours. Partially visible fracture of the proximal left humerus shaft, medial displacement, this area not included on recent comparisons. Normal lung volumes and mediastinal contours. Visualized tracheal air column is within normal limits. Allowing for portable technique the lungs are clear. Negative visible bowel gas pattern. No other No acute osseous abnormality identified. IMPRESSION: 1. Partially visible displaced proximal left humerus fracture. 2. No acute cardiopulmonary abnormality. Electronically Signed   By: VEAR Hurst M.D.   On: 11/15/2023 06:45   DG Pelvis Portable Result Date: 11/15/2023 CLINICAL DATA:  73 year old male with syncope, fall with pain. History of multiple myeloma. EXAM: PORTABLE PELVIS 1-2 VIEWS COMPARISON:  Abdominal radiographs 08/30/2023 and earlier. FINDINGS: Portable AP supine view at 0620 hours. Pelvis bone mineralization appears stable and within normal limits. Femoral heads appear located. No pelvis fracture identified. Grossly intact proximal  femurs. Calcified iliofemoral atherosclerosis. Nonobstructed bowel-gas pattern. IMPRESSION: No acute fracture or dislocation identified about the pelvis. Electronically Signed   By: VEAR Hurst M.D.   On: 11/15/2023 06:43   DG Chest Port 1 View Result Date: 11/11/2023 EXAM: 1 VIEW XRAY OF THE CHEST 11/11/2023 03:33:00 PM COMPARISON: AP radiograph of the chest dated 09/23/2023. CLINICAL HISTORY: Fall trauma. FINDINGS: LUNGS AND PLEURA: No focal pulmonary opacity. No pulmonary edema. No pleural effusion. No pneumothorax. HEART AND MEDIASTINUM: No acute abnormality of the cardiac and mediastinal silhouettes. BONES AND SOFT TISSUES: No acute osseous abnormality. IMPRESSION: 1. No acute process. Electronically signed by: evalene coho 11/11/2023 03:58 PM EDT RP Workstation: [id number]   CT Cervical Spine Wo Contrast Result Date: 11/11/2023 EXAM: CT CERVICAL SPINE WITHOUT CONTRAST 11/11/2023 03:50:08 PM TECHNIQUE: CT of the cervical spine was performed without the administration of intravenous contrast. Multiplanar reformatted images are provided for review. Automated exposure control, iterative reconstruction, and/or weight based adjustment of the mA/kV was utilized to reduce the radiation dose to as low as reasonably achievable. COMPARISON: CT of the cervical spine dated 08/27/2023. CLINICAL HISTORY: Neck pain, acute, no red flags. PT BIB [facility] from home. PT was outside and fell. Fall was witnessed by son who states he hit his head. PT denies hitting head. PT is slightly confused which is not new but no dx of dementia. In the last 12 hrs. PT reportedly pulled out more of the wiring in his insulin  pump. CBG 598 on scene and minimal fluids given. FINDINGS: CERVICAL SPINE: BONES AND ALIGNMENT: No acute fracture or traumatic malalignment. DEGENERATIVE CHANGES: Slight degenerative ventralisthesis at C3-4. Chronic degenerative disc disease at C5-6 and C6-7 with mild central spinal canal stenosis at both levels. SOFT  TISSUES: No prevertebral soft tissue swelling. VASCULATURE: Moderate calcification within the carotid bulbs bilaterally. IMPRESSION: 1. No acute abnormality of the cervical spine related to the reported fall. 2. Chronic degenerative disc disease at C5-6 and C6-7 with mild central spinal canal stenosis at both levels. Electronically signed by: evalene coho 11/11/2023 03:57 PM EDT RP Workstation: [id number]   CT Head Wo Contrast Result Date: 11/11/2023 EXAM: CT HEAD WITHOUT CONTRAST 11/11/2023 03:50:08 PM TECHNIQUE: CT of the head was performed without the administration of intravenous contrast. Automated exposure control, iterative reconstruction, and/or weight based adjustment of the mA/kV was utilized to reduce the radiation dose to as low as reasonably achievable. COMPARISON: CT of the head dated 09/14/2023. CLINICAL HISTORY: Head trauma, moderate-severe. PT BIB [facility] from home. PT was outside and fell. Fall was witnessed by son  who states he hit his head. PT denies hitting head. PT is slightly confused which is not new but no dx of dementia. In the last 12 hrs. PT reportedly pulled out more of the wiring in his insulin  pump. CBG 598 on scene and minimal fluids given. FINDINGS: BRAIN AND VENTRICLES: No acute hemorrhage. Gray-white differentiation is preserved. No hydrocephalus. No extra-axial collection. No mass effect or midline shift. Age-related cerebral volume loss and mild periventricular white matter disease. ORBITS: No acute abnormality. SINUSES: No acute abnormality. SOFT TISSUES AND SKULL: No acute soft tissue abnormality. No skull fracture. Extensive dystrophic calcifications along the falx. Moderate vascular calcifications. The previously described subdural hematoma has resolved. IMPRESSION: 1. No acute intracranial abnormality related to the head trauma. 2. Age-related cerebral volume loss and mild periventricular white matter disease. 3. Extensive dystrophic calcifications along the falx and  moderate vascular calcifications. Electronically signed by: evalene coho 11/11/2023 03:54 PM EDT RP Workstation: [id number]   ECHO as above.  TELEMETRY reviewed by me 11/22/2023: Not on telemetry.  EKG reviewed by me: Narrow complex regular, AV accelerated junctional rhythm, rate 84 bpm  Data reviewed by me 11/22/2023: last 24h vitals tele labs imaging I/O hospitalist progress notes.  Principal Problem:   Intraventricular hemorrhage (HCC) Active Problems:   Anemia of chronic disease   Syncope and collapse, recurrent   Hyponatremia   Uncontrolled type 1 diabetes mellitus with hyperglycemia, with long-term current use of insulin  (HCC)   Seizure disorder (HCC)   MCI (mild cognitive impairment) with memory loss   Fall   Left supracondylar humerus fracture, sequela   Atrial fibrillation, chronic (HCC)    ASSESSMENT AND PLAN:   Nicholas Weiss is a 73 y.o. male  with a past medical history of recent history of fall with subdural hematoma (08/2023), chronic orthostatic hypotension (on midodrine  5 mg 3 times daily), atrial fibrillation (on Eliquis ), seizure disorder, mild cognitive impairment, alcohol use, type 1 diabetes mellitus, hyperlipidemia who presented to the ED on 11/15/2023 for frequent falls and recent LOC.  He is confused at baseline at home.  Cardiology was consulted for further evaluation of labile blood pressure and orthostatic hypotension.  # Orthostatic hypotension # Possible syncope? # Hyperlipidemia # Mild cognitive impairment -Monitor and replenish electrolytes for a goal K >4, Mag >2  -Recommend adequate hydration/p.o. intake. Renal function has improved s/p IV fluids. -Continue midodrine  2.5 mg 3 times daily. -Continue home atorvastatin  80 mg daily. -Continue with conservative cardiac management. -Recommend using PRN hydralazine  for intermittent elevated SBP. Otherwise do not recommend daily antihypertensive medications.   This patient's plan of care was  discussed and created with Dr. Wilburn and he is in agreement.  Signed: Dorene Comfort, PA-C  11/22/2023, 9:35 AM [facility]

## 2023-11-22 NOTE — Plan of Care (Signed)
  Problem: Education: Goal: Knowledge of disease or condition will improve Outcome: Progressing Goal: Knowledge of secondary prevention will improve (MUST DOCUMENT ALL) Outcome: Progressing Goal: Knowledge of patient specific risk factors will improve (DELETE if not current risk factor) Outcome: Progressing   Problem: Ischemic Stroke/TIA Tissue Perfusion: Goal: Complications of ischemic stroke/TIA will be minimized Outcome: Progressing   Problem: Coping: Goal: Will verbalize positive feelings about self Outcome: Progressing Goal: Will identify appropriate support needs Outcome: Progressing   Problem: Health Behavior/Discharge Planning: Goal: Ability to manage health-related needs will improve Outcome: Progressing Goal: Goals will be collaboratively established with patient/family Outcome: Progressing   Problem: Self-Care: Goal: Ability to participate in self-care as condition permits will improve Outcome: Progressing Goal: Verbalization of feelings and concerns over difficulty with self-care will improve Outcome: Progressing Goal: Ability to communicate needs accurately will improve Outcome: Progressing   Problem: Nutrition: Goal: Risk of aspiration will decrease Outcome: Progressing Goal: Dietary intake will improve Outcome: Progressing   Problem: Education: Goal: Ability to describe self-care measures that may prevent or decrease complications (Diabetes Survival Skills Education) will improve Outcome: Progressing   Problem: Coping: Goal: Ability to adjust to condition or change in health will improve Outcome: Progressing   Problem: Fluid Volume: Goal: Ability to maintain a balanced intake and output will improve Outcome: Progressing   Problem: Health Behavior/Discharge Planning: Goal: Ability to identify and utilize available resources and services will improve Outcome: Progressing Goal: Ability to manage health-related needs will improve Outcome: Progressing    Problem: Metabolic: Goal: Ability to maintain appropriate glucose levels will improve Outcome: Progressing   Problem: Nutritional: Goal: Maintenance of adequate nutrition will improve Outcome: Progressing Goal: Progress toward achieving an optimal weight will improve Outcome: Progressing   Problem: Skin Integrity: Goal: Risk for impaired skin integrity will decrease Outcome: Progressing   Problem: Tissue Perfusion: Goal: Adequacy of tissue perfusion will improve Outcome: Progressing   Problem: Education: Goal: Knowledge of General Education information will improve Description: Including pain rating scale, medication(s)/side effects and non-pharmacologic comfort measures Outcome: Progressing   Problem: Health Behavior/Discharge Planning: Goal: Ability to manage health-related needs will improve Outcome: Progressing   Problem: Clinical Measurements: Goal: Ability to maintain clinical measurements within normal limits will improve Outcome: Progressing Goal: Will remain free from infection Outcome: Progressing Goal: Diagnostic test results will improve Outcome: Progressing Goal: Respiratory complications will improve Outcome: Progressing Goal: Cardiovascular complication will be avoided Outcome: Progressing   Problem: Activity: Goal: Risk for activity intolerance will decrease Outcome: Progressing   Problem: Nutrition: Goal: Adequate nutrition will be maintained Outcome: Progressing   Problem: Coping: Goal: Level of anxiety will decrease Outcome: Progressing   Problem: Elimination: Goal: Will not experience complications related to bowel motility Outcome: Progressing Goal: Will not experience complications related to urinary retention Outcome: Progressing   Problem: Pain Managment: Goal: General experience of comfort will improve and/or be controlled Outcome: Progressing   Problem: Safety: Goal: Ability to remain free from injury will improve Outcome:  Progressing   Problem: Skin Integrity: Goal: Risk for impaired skin integrity will decrease Outcome: Progressing   Problem: Education: Goal: Knowledge of disease or condition will improve Outcome: Progressing Goal: Knowledge of secondary prevention will improve (MUST DOCUMENT ALL) Outcome: Progressing Goal: Knowledge of patient specific risk factors will improve (DELETE if not current risk factor) Outcome: Progressing

## 2023-11-22 NOTE — Progress Notes (Signed)
 Speech Language Pathology Treatment: Dysphagia  Patient Details Name: Nicholas Weiss MRN: [id number] DOB: [birth year] Today's Date: 11/22/2023 Time: 1000-1040 SLP Time Calculation (min) (ACUTE ONLY): 40 min  Assessment / Plan / Recommendation Clinical Impression  Pt seen for ongoing assessment of swallowing and trials to upgrade diet as appropriate. Suspect Baseline Cognitive decline/Dementia- need for neuropsych f/u for assessment/dx post D/C. Per H&P: Cognitive deficits have been ongoing and confirmed with family. Pt has follow-up with neurologist as per the family report in the next few weeks for further workup.; (Per D/C Summary on 11/13/23).  Pt appears to present w/ oral phase dysphagia; no overt pharyngeal phase swallowing issues noted w/ trials given; suspect impact from Cognitive decline on pt's oral phase presentation during bolus management.  Pt consumed po trials w/ No immediate, overt, clinical s/s of aspiration during po trials. Pt appears at reduced risk for aspiration following general aspiration precautions and when using a modified diet consistency. Pt has challenging factors that could impact oropharyngeal swallowing to include Cognitive decline/Confusion, deconditioning/weakness, Edentulous status, and need for support w/ self-feeding at meals. These factors can increase risk for aspiration, dysphagia as well as decreased oral intake overall.   During po trials of thin liquids, purees, and minced/moistened solids, pt consumed all consistencies w/ no overt coughing, decline in vocal quality, or change in respiratory presentation during/post trials. Oral phase appeared impacted by the Cognitive decline c/b swishing of liquids and purees w/ a min increased vertical/munching pattern during purees and minced/moistened solids. Pt did not exhibit a significantly increased oral phase time during bolus management (for mastication) of the minced foods vs the purees- all was lengthier. Oral  clearing achieved w/ all trial consistencies -- boluses were moistened and alternated b/t to aid oral clearing.  OF NOTE: Pt fed self w/ setup support. This aids Cognitive awareness and safety during the task.   Recommend a MINCED foods diet(Dysphagia level 2) w/ well-moistened foods; Thin liquids -- monitor straw use, and pt should help to Hold Cup when drinking. Recommend general aspiration precautions including REDUCE distractions at meals, Small bites/sips Slowly. Encouraged self-feeding but give Support and Supervision during meals. Pills CRUSHED in Puree for safer, easier swallowing -- it was encouraged now and for D/C to next venue of care.  Education given on Pills in Puree; food consistencies and easy to eat options; general aspiration precautions and self-feeding to pt- unsure of pt's level of comprehension.   Suspect pt is at his Baseline and would benefit from this consistency diet for oral intake/nutrition needs d/t the Cognitive decline and Edentulous status.   MD/NSG to reconsult if any new needs arise while admitted. NSG updated, agreed. MD updated. Recommend Dietician f/u for support. Palliative Care for f/u w/ GOC re: discussion of Impact of Cognitive decline on oral intake/swallowing/nutrition moving forward.        HPI HPI: Pt is a 70 male who presented to ED today following syncopal episode. Pt recently admitted to hospital s/p fall. Pt with hx of recurrent falls, SDH, LUE fx, hyperosmolar nonketotic status, DMI with poor glycemic control, acute on chronic CKD, orthostatic hypotension, a-fb, sz, mult myeloma, HLC, MCI. Per chart review, cognitive deficits have been ongoing and confirmed with family.  Has follow-up with neurologist as per the family in the next few weeks for further workup.; (Per D/C Summary on 11/13/23). Head CT, 11/15/23, 1. Similar focus of intraventricular hemorrhage within the body of the right  lateral ventricle.  2. Additional smaller focus of hemorrhage  within the body of the left lateral  ventricle, slightly decreased in size.  3. Small focus of hemorrhage in the right occipital horn is new from prior,  possibly redistribution of blood products..   Patient's wife provided the history that she heard him fall, stepped out of the room and found him lying down with facedown forward.  Patient was unconscious about 5 to 10 minutes.  No convulsions.  She also stated that he has a frequent falls.  No history of recent infections, cough cold, fever, sick contact.  Patient normally ambulates with a walker at home.  He is Confused at Baseline at home.  Patient was recently admitted to the hospital between 11/11/2023 to 11/13/2023 after presenting a fall.  As noted above he had a similar incident with the fall in June 2025 and had a subdural hematoma and a fractured left upper extremity.  He also had insulin  pump malfunction and presented with nonketotic hyperosmolar state in August 2025.      SLP Plan  Continue with current plan of care          Recommendations  Diet recommendations: Dysphagia 2 (fine chop);Thin liquid (added purees) Liquids provided via: Cup;Straw (monitor) Medication Administration: Crushed with puree Supervision: Staff to assist with self feeding;Full supervision/cueing for compensatory strategies;Patient able to self feed Compensations: Minimize environmental distractions;Slow rate;Small sips/bites;Lingual sweep for clearance of pocketing;Follow solids with liquid Postural Changes and/or Swallow Maneuvers: Out of bed for meals;Seated upright 90 degrees;Upright 30-60 min after meal                 (Dietician f/u; Palliative Care for GOC; Neuropsych for Cognitive assesment/dx) Oral care BID;Oral care before and after PO;Staff/trained caregiver to provide oral care   Frequent or constant Supervision/Assistance Dysphagia, oral phase (R13.11) (Cognitive decline at Baseline; Edentulous)     Continue with current plan of care        Comer Portugal, MS, CCC-SLP Speech Language Pathologist Rehab Services; [facility] [place] [phone] (ascom) Marylouise Mallet  11/22/2023, 3:52 PM

## 2023-11-22 NOTE — Progress Notes (Signed)
 Physical Therapy Treatment Patient Details Name: Nicholas Weiss MRN: [id number] DOB: 1950/07/04 Today's Date: 11/22/2023   History of Present Illness 73 y.o. male present to hospitalafter he lost consciousness and fell on the ground.  Patient was recently admitted to the hospital between 11/11/2023 to 11/13/2023 after presenting a fall.  As noted above he had a similar incident with the fall in June 2025 and had a subdural hematoma and a fractured left upper extremity.    PT Comments  Patient reclined in bed on arrival with blanket covering his head. Lethargic initially but with stimulation, patient becomes more alert and answering yes/no questions. Required minA for supine>sit. After sitting EOB x 1-2 minutes, patient with 13 point drop in SBP (see below). Deferred OOB mobility this date due to soft BP. Patient with incontinence of bowel movement so required maxA to roll L/R for pericare. At end of session, patient reclined in bed with BP remaining at 77/59. RN notified of soft BP. Discharge plan remains appropriate.    Orthostatic BPs  Supine 80/65  Supine re-take  89/71  Sitting 90/77  Sitting after 1-2 minutes 77/55  Supine after rolling for pericare 77/59     If plan is discharge home, recommend the following: A lot of help with walking and/or transfers;A lot of help with bathing/dressing/bathroom;Assistance with cooking/housework;Assist for transportation;Help with stairs or ramp for entrance   Can travel by private vehicle     No  Equipment Recommendations  None recommended by PT    Recommendations for Other Services       Precautions / Restrictions Precautions Precautions: Fall Recall of Precautions/Restrictions: Impaired Precaution/Restrictions Comments: Assumed NWB on LUE d/t L humerus fx-grimaces in pain to touch; orthostatic Required Braces or Orthoses: Sling Restrictions Weight Bearing Restrictions Per Provider Order: No     Mobility  Bed Mobility Overal bed  mobility: Needs Assistance Bed Mobility: Supine to Sit, Sit to Supine Rolling: Max assist   Supine to sit: Min assist Sit to supine: Max assist   General bed mobility comments: rolling L/R for pericare    Transfers                   General transfer comment: deferred due to low BP after sitting EOB x 1-2 minutes    Ambulation/Gait                   Stairs             Wheelchair Mobility     Tilt Bed    Modified Rankin (Stroke Patients Only)       Balance Overall balance assessment: Needs assistance Sitting-balance support: Feet supported, Single extremity supported Sitting balance-Leahy Scale: Fair                                      Hotel manager: Impaired Factors Affecting Communication: Hearing impaired  Cognition Arousal: Lethargic, Alert Behavior During Therapy: WFL for tasks assessed/performed   PT - Cognitive impairments: No family/caregiver present to determine baseline, History of cognitive impairments                       PT - Cognition Comments: initially lethargic but becomes more awake with stimulation Following commands: Impaired Following commands impaired: Follows one step commands inconsistently    Cueing Cueing Techniques: Verbal cues, Tactile cues, Gestural cues  Exercises  General Comments        Pertinent Vitals/Pain Pain Assessment Pain Assessment: Faces Faces Pain Scale: Hurts little more Pain Location: L shoulder Pain Descriptors / Indicators: Sore Pain Intervention(s): Limited activity within patient's tolerance, Monitored during session, Repositioned    Home Living                          Prior Function            PT Goals (current goals can now be found in the care plan section) Acute Rehab PT Goals PT Goal Formulation: Patient unable to participate in goal setting Time For Goal Achievement: 12/01/23 Potential to Achieve  Goals: Fair Progress towards PT goals: Progressing toward goals    Frequency    Min 2X/week      PT Plan      Co-evaluation              AM-PAC PT 6 Clicks Mobility   Outcome Measure  Help needed turning from your back to your side while in a flat bed without using bedrails?: A Little Help needed moving from lying on your back to sitting on the side of a flat bed without using bedrails?: A Little Help needed moving to and from a bed to a chair (including a wheelchair)?: A Lot Help needed standing up from a chair using your arms (e.g., wheelchair or bedside chair)?: A Lot Help needed to walk in hospital room?: A Lot Help needed climbing 3-5 steps with a railing? : Total 6 Click Score: 13    End of Session Equipment Utilized During Treatment: Other (comment) (Abdominal binder, knee high TED hose) Activity Tolerance: Treatment limited secondary to medical complications (Comment) Patient left: in bed;with call bell/phone within reach;with bed alarm set Nurse Communication: Mobility status;Other (comment) (BP readings) PT Visit Diagnosis: Unsteadiness on feet (R26.81);Other abnormalities of gait and mobility (R26.89);Difficulty in walking, not elsewhere classified (R26.2);Repeated falls (R29.6);Muscle weakness (generalized) (M62.81);History of falling (Z91.81)     Time: 8599-8572 PT Time Calculation (min) (ACUTE ONLY): 27 min  Charges:    $Therapeutic Activity: 23-37 mins PT General Charges $$ ACUTE PT VISIT: 1 Visit                     Maryanne Finder, PT, DPT Physical Therapist - [facility]    Lipa Knauff A Eliz Nigg 11/22/2023, 2:37 PM

## 2023-11-22 NOTE — Consult Note (Signed)
 Consultation Note Date: 11/22/2023   Patient Name: Nicholas Weiss  DOB: 30-Mar-1951  MRN: [id number]  Age / Sex: 73 y.o., male  PCP: Alla Amis, MD Referring Physician: Jerelene Critchley, MD  Reason for Consultation: Establishing goals of care  HPI/Patient Profile: Per H&P :FILMORE MOLYNEUX is a pleasant 73 y.o. male with medical history significant for type I DM, HLD, recent history of fall with subdural hematoma in June 2025, chronic orthostatic hypotension, A-fib on Eliquis , seizure disorder, mild cognitive impairment, EtOH use who was brought in from home after he lost consciousness, fell on the ground.  Patient's wife reported that he did not hit his head.  Patient was confused and not able to provide meaningful history.  Patient's wife provided the history that she heard him fall, stepped out of the room and found him lying down with facedown forward.  Patient was unconscious about 5 to 10 minutes.  No convulsions.  She also stated that he has a frequent falls.  No history of recent infections, cough cold, fever, sick contact.  Patient normally ambulates with a walker at home.  He is confused at baseline at home.   Clinical Assessment and Goals of Care: Notes and labs reviewed. Patient known to PMT from previous admissions.  Patient lives with his significant other [name] of over 30 years. They share 2 sons. In previous admissions it was discussed that he would want [name] to be his surrogate decision maker, but without HPOA papers, legally it would be his sons. He has been amenable to this and has not desired to complete ADs.  Patient is able to tell me he fainted and fell a few months ago which led to a brain injury. He states he had a good summer and was able to spend time with his family.    He tells me Marcey is the president. Unable to complete GOC. Will follow up tomorrow.     SUMMARY OF  RECOMMENDATIONS    PMT to follow     Primary Diagnoses: Present on Admission:  MCI (mild cognitive impairment) with memory loss  Syncope and collapse, recurrent  Fall  Uncontrolled type 1 diabetes mellitus with hyperglycemia, with long-term current use of insulin  (HCC)  Hyponatremia  Anemia of chronic disease   I have reviewed the medical record, interviewed the patient and family, and examined the patient. The following aspects are pertinent.  Past Medical History:  Diagnosis Date   Acute metabolic encephalopathy 12/02/2020   DKA (diabetic ketoacidoses) 04/06/2016   Hypercholesteremia    Hypertension    Social History   Socioeconomic History   Marital status: Single    Spouse name: Not on file   Number of children: 3   Years of education: Not on file   Highest education level: High school graduate  Occupational History    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  Tobacco Use   Smoking status: Never   Smokeless tobacco: Never  Vaping Use   Vaping status: Never Used  Substance and Sexual Activity  Alcohol use: Not Currently    Alcohol/week: 0.0 - 1.0 standard drinks of alcohol    Comment: once every 2 months   Drug use: Yes    Types: Marijuana, Crack cocaine    Comment: last week   Sexual activity: Not Currently    Birth control/protection: None  Other Topics Concern   Not on file  Social History Narrative   Lives with girlfriend sharon   Social Drivers of Health   Financial Resource Strain: Low Risk  (10/20/2023)   Received from [facility]   Overall Financial Resource Strain (CARDIA)    Difficulty of Paying Living Expenses: Not hard at all  Food Insecurity: No Food Insecurity (11/16/2023)   Hunger Vital Sign    Worried About Running Out of Food in the Last Year: Never true    Ran Out of Food in the Last Year: Never true  Transportation Needs: No Transportation Needs (11/16/2023)   PRAPARE - Administrator, Civil Service  (Medical): No    Lack of Transportation (Non-Medical): No  Physical Activity: Insufficiently Active (01/01/2020)   Exercise Vital Sign    Days of Exercise per Week: 7 days    Minutes of Exercise per Session: 20 min  Stress: No Stress Concern Present (01/01/2020)   [employer] of Occupational Health - Occupational Stress Questionnaire    Feeling of Stress : Not at all  Social Connections: Moderately Isolated (11/16/2023)   Social Connection and Isolation Panel    Frequency of Communication with Friends and Family: More than three times a week    Frequency of Social Gatherings with Friends and Family: More than three times a week    Attends Religious Services: Never    Database administrator or Organizations: No    Attends Engineer, structural: Never    Marital Status: Living with partner   Family History  Problem Relation Age of Onset   Heart attack Father    Hypertension Sister    Cancer Sister    Scheduled Meds:  acyclovir   400 mg Oral Daily   atorvastatin   80 mg Oral QHS   feeding supplement (GLUCERNA SHAKE)  237 mL Oral TID BM   gabapentin   300 mg Oral BID   insulin  aspart  0-5 Units Subcutaneous QHS   insulin  aspart  0-6 Units Subcutaneous TID WC   insulin  glargine  6 Units Subcutaneous Daily   latanoprost   1 drop Both Eyes QHS   levETIRAcetam   500 mg Oral BID   midodrine   2.5 mg Oral TID   multivitamin with minerals  1 tablet Oral Daily   Continuous Infusions:  lactated ringers  50 mL/hr at 11/22/23 0726   PRN Meds:.acetaminophen  **OR** acetaminophen , labetalol , mouth rinse, oxyCODONE -acetaminophen , polyethylene glycol, risperiDONE  Medications Prior to Admission:  Prior to Admission medications   Medication Sig Start Date End Date Taking? Authorizing Provider  acetaminophen  (TYLENOL ) 325 MG tablet Take 2 tablets (650 mg total) by mouth every 6 (six) hours as needed for mild pain (pain score 1-3) or fever. 09/17/23  Yes Wieting, Richard, MD  acyclovir   (ZOVIRAX ) 400 MG tablet Take 400 mg by mouth in the morning and at bedtime.   Yes [provider]  apixaban  (ELIQUIS ) 5 MG TABS tablet Take 5 mg by mouth 2 (two) times daily. 10/20/23  Yes [provider]  atorvastatin  (LIPITOR ) 80 MG tablet Take 1 tablet (80 mg total) by mouth at bedtime. Hold while taking Paxlovid  04/08/22  Yes Amin, Sumayya,  MD  fludrocortisone  (FLORINEF ) 0.1 MG tablet Take 1 tablet (0.1 mg total) by mouth daily as needed. Skip the dose if SBP >130 Patient taking differently: Take 0.1 mg by mouth daily. Skip the dose if SBP >130 09/17/23  Yes Wieting, Richard, MD  gabapentin  (NEURONTIN ) 300 MG capsule Take 1 capsule (300 mg total) by mouth 2 (two) times daily. 09/17/23  Yes Wieting, Richard, MD  insulin  lispro (HUMALOG) 100 UNIT/ML injection INJECT 150 UNITS EVERY 3 DAYS VIA INSULIN  PUMP AS DIRECTED 10/26/23  Yes [provider]  latanoprost  (XALATAN ) 0.005 % ophthalmic solution 1 drop at bedtime. 11/02/23  Yes [provider]  levETIRAcetam  (KEPPRA ) 500 MG tablet Take 1 tablet by mouth twice daily 09/09/23  Yes Vaslow, [name] K, MD  midodrine  (PROAMATINE ) 5 MG tablet Take 1 tablet (5 mg total) by mouth 3 (three) times daily. 11/13/23  Yes Singh, Prashant K, MD  oxyCODONE -acetaminophen  (PERCOCET/ROXICET) 5-325 MG tablet Take 1 tablet by mouth every 6 (six) hours as needed for severe pain (pain score 7-10).   Yes [provider]  pomalidomide  (POMALYST ) 2 MG capsule Take 1 capsule (2 mg total) by mouth daily. Take for 21 days, the hold for 7 days. Repeat every 28 days. 10/19/23  Yes Melanee Annah BROCKS, MD  Continuous Glucose Sensor (DEXCOM G7 SENSOR) MISC 1 each by Does not apply route 4 (four) times daily -  before meals and at bedtime. 04/10/23   Jhonny Calvin NOVAK, MD  iron  polysaccharides (NIFEREX) 150 MG capsule Take 1 capsule (150 mg total) by mouth daily. Patient not taking: Reported on 11/11/2023 03/20/23 11/10/23  Von Bellis, MD   Allergies   Allergen Reactions   Penicillins Other (See Comments)    Childhood allergy -tolerated amoxil  03/2022 Unknown reaction Has patient had a PCN reaction causing immediate rash, facial/tongue/throat swelling, SOB or lightheadedness with hypotension: No Has patient had a PCN reaction causing severe rash involving mucus membranes or skin necrosis: No Has patient had a PCN reaction that required hospitalization No Has patient had a PCN reaction occurring within the last 10 years: No If all of the above answers are NO, then may proceed with Cephalosporin use.    Review of Systems  All other systems reviewed and are negative.   Physical Exam Pulmonary:     Effort: Pulmonary effort is normal.  Skin:    General: Skin is warm and dry.  Neurological:     Mental Status: He is alert.     Vital Signs: BP (P) 116/73 (BP Location: Right Arm)   Pulse (P) 90   Temp 97.8 F (36.6 C) (Oral)   Resp 19   Ht 5' 10 (1.778 m)   Wt 61.8 kg   SpO2 100%   BMI 19.55 kg/m  Pain Scale: 0-10   Pain Score: 0-No pain   SpO2: SpO2: 100 % O2 Device:SpO2: 100 % O2 Flow Rate: .   IO: Intake/output summary:  Intake/Output Summary (Last 24 hours) at 11/22/2023 1508 Last data filed at 11/22/2023 1426 Gross per 24 hour  Intake 970.48 ml  Output --  Net 970.48 ml    LBM: Last BM Date : 11/21/23 Baseline Weight: Weight: 63 kg Most recent weight: Weight: 61.8 kg      Signed by: Camelia Lewis, NP   Please contact Palliative Medicine Team phone at [phone] for questions and concerns.  For individual provider: See Tracey

## 2023-11-22 NOTE — Progress Notes (Signed)
 Progress Note   Patient: Nicholas Weiss FMW:[id number] DOB: September 28, 1950 DOA: 11/15/2023     6 DOS: the patient was seen and examined on 11/22/2023   Brief hospital course: 73 y.o. male with medical history significant for type I DM, HLD, recent history of fall with subdural hematoma in June 2025, chronic orthostatic hypotension, A-fib on Eliquis , seizure disorder, mild cognitive impairment, EtOH use who was brought in from home after he lost consciousness, fell on the ground, has been having recurrent falls and confused at baseline.  Workup revealed intraventricular right lateral ventricle and occipital horn of right ventricle on CT scan of the brain, seen by neurosurgery recommended conservative management.  Neurology was consulted, MRI negative for acute stroke, recommend holding anticoagulation long-term due to recurrent falls and history of SDH and now with intraventricular hemorrhage.  Patient was found to be lethargic on 08/20, with CT stable  Rapid response was called on 08/22 as patient was found unresponsive, CT head stable, improved back to baseline within an hour with no intervention. Code stroke was called 08/23, CT stroke stable Consulted Cardiology for management of Orthostatic hypotension, labile  Discussed with partner Bruce, patient is sleepy mostly during the day and up at night. Also confused at baseline  Assessment and Plan:   Intraventricular hemorrhage (HCC) -Appreciate neurosurgical consultation recommended conservative management.   -Appreciate neurological consultation.  MRI does not show any acute stroke -CTA calcified atherosclerosis at carotid bifurcations approximately 50% stenosis of proximal cervical ICAs.  Severe stenosis right vertebral artery origin. -Repeat CT 08/20 unchanged focus of hemorrhage within body of right lateral ventricle, Layering hemorrhage within the occipital horns of both lateral ventricles, slightly increased (likely related to layering/  redistribution) - Holding Eliquis .  PT/OT eval - Rapid response was called on 08/22 as patient was unresponsive, repeat CT head No new or worsening intracranial hemorrhage - Code stroke called 08/23 - CT stroke stable - Patient is intermittently more responsive, waxing and waning mental status - poor po intake, encourage po intake and hydration - Follow-up with outpatient neurology and neurosurgery in 2 weeks with repeat CT - SLP pureed diet - Dysphagia 2    Syncope and collapse, recurrent - History of orthostatic hypotension HTN - Continue midodrine  with holding parameters if SBP > 120 - Abdominal binder and TED hose - Seen by Cardiology, appreciate recs - IV fluids to maintain hydration - poor po intake, sleeping most of the day - IV Labetalol  5mg  prn if SBP > 160   Uncontrolled type 1 diabetes mellitus with hyperglycemia, with long-term current use of insulin  (HCC) Uses insulin  pump at home. will do Semglee  6u, insulin  plus sliding scale. Labile due to varying po intake   Hyponatremia Sodium 135   Seizure disorder (HCC) Continue Keppra  and gabapentin    Anemia of chronic disease Last hemoglobin 9.0   Left supracondylar humerus fracture, sequela Seen on hospitalization 2 months ago   MCI (mild cognitive impairment) with memory loss As needed risperidol for agitation. Seroquel  at night -Outpatient neuropsychological testing  -- Palliative care consulted for GOC   Subjective: Patient is lying in the bed, sleeping most times, poor po intake, on IV fluids Seen by cardiology, on Iv fluids Palliative care consulted Called partner [name], discussed updates and answered all questions   Physical Exam: Vitals:   11/22/23 0447 11/22/23 0818 11/22/23 1139 11/22/23 1515  BP: (!) 101/59 (!) 132/90 116/73 116/78  Pulse: 79 88 90 96  Resp:  19  18  Temp: (!)  97.5 F (36.4 C) 97.8 F (36.6 C)  98.1 F (36.7 C)  TempSrc:  Oral    SpO2: 100% 100%  100%  Weight:       Height:       Cardiovascular:     Rate and Rhythm: Normal rate. Rhythm irregularly irregular.     Heart sounds: Normal heart sounds, S1 normal and S2 normal.  Pulmonary:     Breath sounds: No decreased breath sounds, wheezing, rhonchi or rales.  Abdominal:     Palpations: Abdomen is soft.     Tenderness: There is no abdominal tenderness.  Musculoskeletal:     Right lower leg: No swelling.     Left lower leg: No swelling.  Skin:    General: Skin is warm.     Findings: No rash.  Neurological:     Mental Status: sleeping mostly, answering simple questions, following simple commands   Family Communication: Called partner [name], discussed updates and answered all questions  Disposition: Status is: Inpatient Remains inpatient appropriate because  Planned Discharge Destination: Skilled nursing facility  Time spent: 39 minutes   Author: Laree Lock, MD 11/22/2023 4:10 PM  For on call review [email or web address].

## 2023-11-22 NOTE — NC FL2 (Signed)
 [place]  MEDICAID FL2 LEVEL OF CARE FORM     IDENTIFICATION  Patient Name: Nicholas Weiss Birthdate: November 06, 1950 Sex: male Admission Date (Current Location): 11/15/2023  [place] and [place] Number:  Chiropodist and Address:  [facility], [address] [place], [place] [address]      Provider Number: [id number]  Attending Physician Name and Address:  Jerelene Critchley, MD  Relative Name and Phone Number:  [name] [phone]    Current Level of Care: SNF Recommended Level of Care: Skilled Nursing Facility Prior Approval Number:    Date Approved/Denied:   PASRR Number: [id number]  Discharge Plan: SNF    Current Diagnoses: Patient Active Problem List   Diagnosis Date Noted   Left supracondylar humerus fracture, sequela 11/16/2023   Atrial fibrillation, chronic (HCC) 11/16/2023   Drop in hemoglobin 09/16/2023   Left arm pain 09/15/2023   Fall 09/15/2023   Polysubstance abuse (HCC) 09/15/2023   Myocardial injury 09/15/2023   Subdural bleeding (HCC) 08/27/2023   SDH (subdural hematoma) (HCC) 08/27/2023   MCI (mild cognitive impairment) with memory loss 08/18/2023   DKA (diabetic ketoacidosis) (HCC) 08/15/2023   High anion gap metabolic acidosis 08/15/2023   Prolonged QT interval 07/22/2023   Uncontrolled type 1 diabetes mellitus with hypoglycemia, with long-term current use of insulin  (HCC) 04/05/2023   Hyperosmolar hyperglycemic state (HHS) (HCC) 03/26/2023   Iron  deficiency anemia 03/26/2023   Seizure disorder (HCC) 03/26/2023   Systemic inflammatory response syndrome (SIRS) associated with organ dysfunction (HCC) 03/26/2023   NSTEMI (non-ST elevated myocardial infarction) (HCC) 03/09/2023   Elevated troponin 03/08/2023   Paroxysmal atrial fibrillation (HCC) 03/08/2023   Chronic anticoagulation 03/08/2023   ICH (intracerebral hemorrhage) (HCC) 02/16/2023   Uncontrolled diabetes mellitus with hyperglycemia (HCC) 02/16/2023    GERD without esophagitis 02/16/2023   Diabetic neuropathy (HCC) 02/16/2023   Intraventricular hemorrhage (HCC) 02/16/2023   Myoclonus 01/15/2023   Hypothermia 12/29/2022   Dizziness 11/13/2022   Substance abuse (HCC) 09/18/2022   History of GI bleed 09/18/2022   History of intraventricular hemorrhage following syncopal event 02/15/2023 09/18/2022   Uncontrolled type 1 diabetes mellitus with hyperglycemia, with long-term current use of insulin  (HCC) 08/26/2022   Postural dizziness with presyncope 08/18/2022   Near syncope 08/18/2022   Closed fracture of shaft of left humerus 06/18/2022   Malnutrition of moderate degree 06/10/2022   Orthostatic hypotension 06/10/2022   GI bleed 06/06/2022   Subdural hematoma (HCC) 05/15/2022   Falls 05/15/2022   Shoulder pain, left 05/15/2022   History of amputation of left great toe (HCC) 05/04/2022   Hypophosphatemia 04/28/2022   Protein-calorie malnutrition, severe 04/24/2022   Diabetic ulcer of toe associated with diabetes mellitus due to underlying condition, with fat layer exposed (HCC) 04/24/2022   Streptococcal bacteremia 04/24/2022   Leukopenia 04/24/2022   History of multiple myeloma 04/24/2022   Dysautonomia orthostatic hypotension with recurrent syncope 04/24/2022   Weight loss 04/23/2022   Osteomyelitis of left foot (HCC) 04/22/2022   DKA, type 1 (HCC) 04/22/2022   Dehydration 04/08/2022   Diabetic ketoacidosis without coma associated with type 1 diabetes mellitus (HCC) 04/07/2022   Hyperkalemia 04/07/2022   Traumatic pneumothorax 02/18/2022   Multiple fractures of ribs, left side, initial encounter for closed fracture 02/18/2022   Pneumothorax, closed, traumatic 02/18/2022   Benign prostatic hyperplasia with nocturia 02/12/2022   Stroke (cerebrum) (HCC) 11/07/2021   Multiple myeloma (HCC) 09/29/2021   Multiple myeloma not having achieved remission (HCC) 09/29/2021   Hyponatremia 08/18/2021  COVID-19 virus infection 08/13/2021    History of CVA (cerebrovascular accident) 08/13/2021   Syncope and collapse, recurrent 08/09/2021   Hepatic lesion 08/05/2021   Dyslipidemia 05/08/2021   History of substance abuse (HCC) 12/09/2020   Acute metabolic encephalopathy 12/02/2020   Cocaine abuse (HCC) 11/30/2020   Muscle twitching 09/12/2020   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   Pre-syncope 08/01/2020   Heart block AV first degree 08/01/2020   Anemia of chronic disease 05/28/2020   PUD (peptic ulcer disease)    Esophageal dysphagia    Encounter for screening colonoscopy    Hyperglycemia 07/28/2019   Generalized weakness 07/28/2019   Hypoglycemia 04/27/2019   Unresponsiveness 04/27/2019   Lung nodule 04/07/2019   Acute renal failure superimposed on stage 3a chronic kidney disease (HCC)    Hypertensive urgency 04/05/2019   AKI (acute kidney injury) (HCC) 04/05/2019   CKD (chronic kidney disease) stage 3, GFR 30-59 ml/min (HCC) 04/05/2019   Hyperlipidemia 01/24/2016   Type 1 diabetes mellitus with diabetic neuropathy, unspecified (HCC) 05/31/2006    Orientation RESPIRATION BLADDER Height & Weight     Self, Place    Incontinent Weight: 136 lb 3.9 oz (61.8 kg) Height:  5' 10 (177.8 cm)  BEHAVIORAL SYMPTOMS/MOOD NEUROLOGICAL BOWEL NUTRITION STATUS     (Code stroke 8/23) Incontinent Diet  AMBULATORY STATUS COMMUNICATION OF NEEDS Skin   Extensive Assist Verbally                         Personal Care Assistance Level of Assistance  Bathing, Feeding, Dressing Bathing Assistance: Limited assistance Feeding assistance: Limited assistance Dressing Assistance: Limited assistance     Functional Limitations Info             SPECIAL CARE FACTORS FREQUENCY  PT (By licensed PT), OT (By licensed OT)     PT Frequency: 5x OT Frequency: 5x            Contractures Contractures Info: Not present    Additional Factors Info  Code Status, Allergies Code Status Info: FULL Allergies Info: PENICILLIN            Current Medications (11/22/2023):  This is the current hospital active medication list Current Facility-Administered Medications  Medication Dose Route Frequency Provider Last Rate Last Admin   acetaminophen  (TYLENOL ) tablet 650 mg  650 mg Oral Q6H PRN Roann Gouty, MD       Or   acetaminophen  (TYLENOL ) suppository 650 mg  650 mg Rectal Q6H PRN Paudel, Keshab, MD       acyclovir  (ZOVIRAX ) 200 MG capsule 400 mg  400 mg Oral Daily Wieting, Richard, MD   400 mg at 11/22/23 0911   atorvastatin  (LIPITOR ) tablet 80 mg  80 mg Oral QHS Paudel, Gouty, MD   80 mg at 11/21/23 2232   gabapentin  (NEURONTIN ) capsule 300 mg  300 mg Oral BID Josette Ade, MD   300 mg at 11/22/23 9088   insulin  aspart (novoLOG ) injection 0-5 Units  0-5 Units Subcutaneous QHS Josette Ade, MD   3 Units at 11/21/23 2231   insulin  aspart (novoLOG ) injection 0-6 Units  0-6 Units Subcutaneous TID WC Josette Ade, MD   3 Units at 11/21/23 1739   insulin  glargine (LANTUS ) injection 6 Units  6 Units Subcutaneous Daily Ponnala, Shruthi, MD   6 Units at 11/22/23 0911   labetalol  (NORMODYNE ) injection 5 mg  5 mg Intravenous Q6H PRN Jerelene Critchley, MD       lactated ringers  infusion  Intravenous Continuous Ponnala, Shruthi, MD 50 mL/hr at 11/22/23 0726 Infusion Verify at 11/22/23 0726   latanoprost  (XALATAN ) 0.005 % ophthalmic solution 1 drop  1 drop Both Eyes QHS Paudel, Keshab, MD   1 drop at 11/21/23 2231   levETIRAcetam  (KEPPRA ) tablet 500 mg  500 mg Oral BID Paudel, Keshab, MD   500 mg at 11/22/23 9088   midodrine  (PROAMATINE ) tablet 2.5 mg  2.5 mg Oral TID Josette Ade, MD   2.5 mg at 11/18/23 9354   Oral care mouth rinse  15 mL Mouth Rinse PRN Paudel, Nena, MD       oxyCODONE -acetaminophen  (PERCOCET/ROXICET) 5-325 MG per tablet 1 tablet  1 tablet Oral Q6H PRN Paudel, Keshab, MD   1 tablet at 11/17/23 0025   polyethylene glycol (MIRALAX  / GLYCOLAX ) packet 17 g  17 g Oral Daily PRN Paudel, Keshab, MD    17 g at 11/21/23 9178   risperiDONE  (RISPERDAL ) tablet 0.5 mg  0.5 mg Oral Q8H PRN Josette Ade, MD   0.5 mg at 11/17/23 1216   Facility-Administered Medications Ordered in Other Encounters  Medication Dose Route Frequency Provider Last Rate Last Admin   epoetin  alfa-epbx (RETACRIT ) injection 40,000 Units  40,000 Units Subcutaneous Q21 days Rao, Archana C, MD   40,000 Units at 07/09/23 1153     Discharge Medications: Please see discharge summary for a list of discharge medications.  Relevant Imaging Results:  Relevant Lab Results:   Additional Information [id number]  [place] L Cherre Kothari, LCSW

## 2023-11-22 NOTE — Progress Notes (Signed)
 Initial Nutrition Assessment  DOCUMENTATION CODES:   Non-severe (moderate) malnutrition in context of chronic illness  INTERVENTION:   -Continue with dysphagia 2 diet with thin liquids -Glucerna Shake po TID, each supplement provides 220 kcal and 10 grams of protein  -Magic cup TID with meals, each supplement provides 290 kcal and 9 grams of protein  -MVI with minerals daily   NUTRITION DIAGNOSIS:   Moderate Malnutrition related to chronic illness as evidenced by mild fat depletion, moderate fat depletion, moderate muscle depletion, severe muscle depletion, percent weight loss.  GOAL:   Patient will meet greater than or equal to 90% of their needs  MONITOR:   PO intake, Supplement acceptance, Diet advancement  REASON FOR ASSESSMENT:   Consult Assessment of nutrition requirement/status  ASSESSMENT:   Pt with medical history significant for type I DM, HLD, recent history of fall with subdural hematoma in June 2025, chronic orthostatic hypotension, A-fib on Eliquis , seizure disorder, mild cognitive impairment, EtOH use who was brought in from home after he lost consciousness, fell on the ground, has been having recurrent falls and confused at baseline.  Pt admitted with intraventricular hemorrhage.   8/19- s/p BSE- dysphagia 1 diet with thin liquids 8/25- s/p BSE- dysphagia 2 diet with thin liquids  Reviewed I/O's: +209 ml x 24 hours and -448 ml since admission  Per chart review, workup revealed intraventricular right lateral ventricle and occipital horn of right ventricle on CT scan of the brain. MRI negative for acute stroke, recommend holding anticoagulation long-term due to recurrent falls and history of SDH and now with intraventricular hemorrhage.   Pt sitting up in bed at time of visit, with blankets over his head. Pt removed blankets when asked so RD could examine him. Pt is familiar to this RD due to multiple prior admissions. Pt reports he was living with his  significant other PTA and usually consumed 1 meal per day. Pt unable to provide diet recall, stating significant other cooks for him and she makes all kinds of things.   Pt denies any weight loss. Reviewed wt hx; pt has experienced a 9.1% wt loss over the past month, which is significant for time frame.   Per meal completion records, meal completions 20-100%. Case discussed with RN, who reports pt is alert and eating today. She is unsure why calorie count has been ordered.   Reached out to MD to clarify calorie count order as well of findings of assessment. Per MD, ok to hold off on calorie count.   Discussed importance of good meal and supplement intake to promote healing.   Medications reviewed and include neurontin , keppra , and lactated ringer  infusion @ 50 ml/hr.   Lab Results  Component Value Date   HGBA1C 10.0 (H) 07/21/2023   PTA DM medications are 150 units insulin  lispro every 3 days via insulin  pump. Pt also with Dexcom CGM.   Labs reviewed: CBGS: 71-362 (inpatient orders for glycemic control are 0-5 units insulin  aspart daily at bedtime, 0-6 units insulin  aspart TID with meals, and 6 units insulin  glargine daily).    NUTRITION - FOCUSED PHYSICAL EXAM:  Flowsheet Row Most Recent Value  Orbital Region Mild depletion  Upper Arm Region Moderate depletion  Thoracic and Lumbar Region No depletion  Buccal Region No depletion  Temple Region Moderate depletion  Clavicle Bone Region Mild depletion  Clavicle and Acromion Bone Region Mild depletion  Scapular Bone Region Mild depletion  Dorsal Hand Moderate depletion  Patellar Region Moderate depletion  Anterior Thigh  Region Moderate depletion  Posterior Calf Region Moderate depletion  Edema (RD Assessment) Mild  Hair Reviewed  Eyes Reviewed  Mouth Reviewed  Skin Reviewed  Nails Reviewed    Diet Order:   Diet Order             DIET DYS 2 Room service appropriate? Yes with Assist; Fluid consistency: Thin  Diet effective  now                   EDUCATION NEEDS:   Education needs have been addressed  Skin:  Skin Assessment: Reviewed RN Assessment  Last BM:  11/21/23 (type 5)  Height:   Ht Readings from Last 1 Encounters:  11/15/23 5' 10 (1.778 m)    Weight:   Wt Readings from Last 1 Encounters:  11/15/23 61.8 kg    Ideal Body Weight:  75.5 kg  BMI:  Body mass index is 19.55 kg/m.  Estimated Nutritional Needs:   Kcal:  1850-2050  Protein:  100-115 grams  Fluid:  1.8-2.0 L    Margery ORN, RD, LDN, CDCES Registered Dietitian III Certified Diabetes Care and Education Specialist If unable to reach this RD, please use RD Inpatient group chat on secure chat between hours of 8am-4 pm daily

## 2023-11-23 ENCOUNTER — Inpatient Hospital Stay

## 2023-11-23 DIAGNOSIS — I615 Nontraumatic intracerebral hemorrhage, intraventricular: Secondary | ICD-10-CM | POA: Diagnosis not present

## 2023-11-23 DIAGNOSIS — Z7189 Other specified counseling: Secondary | ICD-10-CM | POA: Diagnosis not present

## 2023-11-23 LAB — BASIC METABOLIC PANEL WITH GFR
Anion gap: 4 — ABNORMAL LOW (ref 5–15)
BUN: 34 mg/dL — ABNORMAL HIGH (ref 8–23)
CO2: 27 mmol/L (ref 22–32)
Calcium: 8.2 mg/dL — ABNORMAL LOW (ref 8.9–10.3)
Chloride: 105 mmol/L (ref 98–111)
Creatinine, Ser: 1.26 mg/dL — ABNORMAL HIGH (ref 0.61–1.24)
GFR, Estimated: >60 mL/min (ref >60)
Glucose, Bld: 122 mg/dL — ABNORMAL HIGH (ref 70–99)
Potassium: 4.3 mmol/L (ref 3.5–5.1)
Sodium: 136 mmol/L (ref 135–145)

## 2023-11-23 LAB — GLUCOSE, CAPILLARY
Glucose-Capillary: 128 mg/dL — ABNORMAL HIGH (ref 70–99)
Glucose-Capillary: 242 mg/dL — ABNORMAL HIGH (ref 70–99)
Glucose-Capillary: 343 mg/dL — ABNORMAL HIGH (ref 70–99)
Glucose-Capillary: 140 mg/dL — ABNORMAL HIGH (ref 70–99)

## 2023-11-23 MED ORDER — INSULIN ASPART 100 UNIT/ML IJ SOLN
2.0000 [IU] | Freq: Three times a day (TID) | INTRAMUSCULAR | Status: DC
  Administered 2023-11-23 – 2023-11-24 (×4): 2 [IU] via SUBCUTANEOUS
  Filled 2023-11-23 (×4): qty 1

## 2023-11-23 NOTE — Inpatient Diabetes Management (Signed)
 Inpatient Diabetes Program Recommendations  AACE/ADA: New Consensus Statement on Inpatient Glycemic Control   Target Ranges:  Prepandial:   less than 140 mg/dL      Peak postprandial:   less than 180 mg/dL (1-2 hours)      Critically ill patients:  140 - 180 mg/dL    Latest Reference Range & Units 11/22/23 08:19 11/22/23 11:36 11/22/23 16:14 11/22/23 19:37 11/23/23 08:17  Glucose-Capillary 70 - 99 mg/dL 71 661 (H) 614 (H) 844 (H) 128 (H)   Review of Glycemic Control  Diabetes history: DM1 hx Outpatient Diabetes medications: Insulin  pump at home (T-slim); Basal rate 12am 0.25 units/hr (24hr = 8 units); ISF 60 mg/dl, I:C ratio 20 grams; Off pump regimen: Novolog  1-9 units tid with meals ,Tresiba  5 units daily Current orders for Inpatient glycemic control: Lantus  6 units daily, Novolog  0-6 units TID, Novolog  0-5 units QHS  Inpatient Diabetes Program Recommendations:    Insulin : Please consider ordering Novolog  2 units TID with meals for meal coverage if patient eats at least 50% of meals.  Thanks, Earnie Gainer, RN, MSN, CDCES Diabetes Coordinator Inpatient Diabetes Program [phone] (Team Pager from 8am to 5pm)

## 2023-11-23 NOTE — Progress Notes (Signed)
 Daily Progress Note   Patient Name: Nicholas Weiss       Date: 11/23/2023 DOB: 16-Dec-1950  Age: 73 y.o. MRN#: [id number] Attending Physician: Jerelene Critchley, MD Primary Care Physician: Alla Amis, MD Admit Date: 11/15/2023  Reason for Consultation/Follow-up: Establishing goals of care  Subjective: Notes and labs reviewed.  In to see patient.  He is currently sitting in bedside chair eating breakfast.  He appears to have a good appetite.  He denies complaint.  Currently he has a Comptroller at bedside.  No family present.  He tells me today that Nicholas is the president.  Upon inquiring what Nicholas he stated Nicholas Weiss is the president.  Stepped out and attempted to call patient's son unsuccessfully.  Called and spoke with his significant other shared.  She states since his stroke his mental status has declined and he has had more confusion.  She discusses being hopeful for his ability to go to rehab.  She confirms wishes for full code and full scope care.   Length of Stay: 7  Current Medications: Scheduled Meds:   acyclovir   400 mg Oral Daily   atorvastatin   80 mg Oral QHS   feeding supplement (GLUCERNA SHAKE)  237 mL Oral TID BM   gabapentin   300 mg Oral BID   insulin  aspart  0-5 Units Subcutaneous QHS   insulin  aspart  0-6 Units Subcutaneous TID WC   insulin  glargine  6 Units Subcutaneous Daily   latanoprost   1 drop Both Eyes QHS   levETIRAcetam   500 mg Oral BID   midodrine   2.5 mg Oral TID   multivitamin with minerals  1 tablet Oral Daily    Continuous Infusions:  lactated ringers  50 mL/hr at 11/22/23 1748    PRN Meds: acetaminophen  **OR** acetaminophen , labetalol , mouth rinse, oxyCODONE -acetaminophen , polyethylene glycol, risperiDONE   Physical Exam Pulmonary:      Effort: Pulmonary effort is normal.  Neurological:     Mental Status: He is alert.             Vital Signs: BP (!) 158/96 (BP Location: Right Arm)   Pulse 91   Temp 98.3 F (36.8 C) (Oral)   Resp 17   Ht 5' 10 (1.778 m)   Wt 61.8 kg   SpO2 100%   BMI 19.55 kg/m  SpO2: SpO2: 100 %  O2 Device: O2 Device: Room Air O2 Flow Rate:    Intake/output summary:  Intake/Output Summary (Last 24 hours) at 11/23/2023 1236 Last data filed at 11/23/2023 0900 Gross per 24 hour  Intake 869.54 ml  Output --  Net 869.54 ml   LBM: Last BM Date : 11/22/23 Baseline Weight: Weight: 63 kg Most recent weight: Weight: 61.8 kg  Patient Active Problem List   Diagnosis Date Noted   Protein-calorie malnutrition, moderate (HCC) 11/22/2023   Left supracondylar humerus fracture, sequela 11/16/2023   Atrial fibrillation, chronic (HCC) 11/16/2023   Drop in hemoglobin 09/16/2023   Left arm pain 09/15/2023   Fall 09/15/2023   Polysubstance abuse (HCC) 09/15/2023   Myocardial injury 09/15/2023   Subdural bleeding (HCC) 08/27/2023   SDH (subdural hematoma) (HCC) 08/27/2023   MCI (mild cognitive impairment) with memory loss 08/18/2023   DKA (diabetic ketoacidosis) (HCC) 08/15/2023   High anion gap metabolic acidosis 08/15/2023   Prolonged QT interval 07/22/2023   Uncontrolled type 1 diabetes mellitus with hypoglycemia, with long-term current use of insulin  (HCC) 04/05/2023   Hyperosmolar hyperglycemic state (HHS) (HCC) 03/26/2023   Iron  deficiency anemia 03/26/2023   Seizure disorder (HCC) 03/26/2023   Systemic inflammatory response syndrome (SIRS) associated with organ dysfunction (HCC) 03/26/2023   NSTEMI (non-ST elevated myocardial infarction) (HCC) 03/09/2023   Elevated troponin 03/08/2023   Paroxysmal atrial fibrillation (HCC) 03/08/2023   Chronic anticoagulation 03/08/2023   ICH (intracerebral hemorrhage) (HCC) 02/16/2023   Uncontrolled diabetes mellitus with hyperglycemia (HCC) 02/16/2023    GERD without esophagitis 02/16/2023   Diabetic neuropathy (HCC) 02/16/2023   Intraventricular hemorrhage (HCC) 02/16/2023   Myoclonus 01/15/2023   Hypothermia 12/29/2022   Dizziness 11/13/2022   Substance abuse (HCC) 09/18/2022   History of GI bleed 09/18/2022   History of intraventricular hemorrhage following syncopal event 02/15/2023 09/18/2022   Uncontrolled type 1 diabetes mellitus with hyperglycemia, with long-term current use of insulin  (HCC) 08/26/2022   Postural dizziness with presyncope 08/18/2022   Near syncope 08/18/2022   Closed fracture of shaft of left humerus 06/18/2022   Malnutrition of moderate degree 06/10/2022   Orthostatic hypotension 06/10/2022   GI bleed 06/06/2022   Subdural hematoma (HCC) 05/15/2022   Falls 05/15/2022   Shoulder pain, left 05/15/2022   History of amputation of left great toe (HCC) 05/04/2022   Hypophosphatemia 04/28/2022   Protein-calorie malnutrition, severe 04/24/2022   Diabetic ulcer of toe associated with diabetes mellitus due to underlying condition, with fat layer exposed (HCC) 04/24/2022   Streptococcal bacteremia 04/24/2022   Leukopenia 04/24/2022   History of multiple myeloma 04/24/2022   Dysautonomia orthostatic hypotension with recurrent syncope 04/24/2022   Weight loss 04/23/2022   Osteomyelitis of left foot (HCC) 04/22/2022   DKA, type 1 (HCC) 04/22/2022   Dehydration 04/08/2022   Diabetic ketoacidosis without coma associated with type 1 diabetes mellitus (HCC) 04/07/2022   Hyperkalemia 04/07/2022   Traumatic pneumothorax 02/18/2022   Multiple fractures of ribs, left side, initial encounter for closed fracture 02/18/2022   Pneumothorax, closed, traumatic 02/18/2022   Benign prostatic hyperplasia with nocturia 02/12/2022   Stroke (cerebrum) (HCC) 11/07/2021   Multiple myeloma (HCC) 09/29/2021   Multiple myeloma not having achieved remission (HCC) 09/29/2021   Hyponatremia 08/18/2021   COVID-19 virus infection 08/13/2021    History of CVA (cerebrovascular accident) 08/13/2021   Syncope and collapse, recurrent 08/09/2021   Hepatic lesion 08/05/2021   Dyslipidemia 05/08/2021   History of substance abuse (HCC) 12/09/2020   Acute metabolic encephalopathy 12/02/2020   Cocaine  abuse (HCC) 11/30/2020   Muscle twitching 09/12/2020   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   Pre-syncope 08/01/2020   Heart block AV first degree 08/01/2020   Anemia of chronic disease 05/28/2020   PUD (peptic ulcer disease)    Esophageal dysphagia    Encounter for screening colonoscopy    Hyperglycemia 07/28/2019   Generalized weakness 07/28/2019   Hypoglycemia 04/27/2019   Unresponsiveness 04/27/2019   Lung nodule 04/07/2019   Acute renal failure superimposed on stage 3a chronic kidney disease (HCC)    Hypertensive urgency 04/05/2019   AKI (acute kidney injury) (HCC) 04/05/2019   CKD (chronic kidney disease) stage 3, GFR 30-59 ml/min (HCC) 04/05/2019   Hyperlipidemia 01/24/2016   Type 1 diabetes mellitus with diabetic neuropathy, unspecified (HCC) 05/31/2006    Palliative Care Assessment & Plan    Recommendations/Plan: Continue full code and full scope  Code Status:    Code Status Orders  (From admission, onward)           Start     Ordered   11/15/23 0940  Full code  Continuous       Question:  By:  Answer:  Consent: discussion documented in EHR   11/15/23 0941           Code Status History     Date Active Date Inactive Code Status Order ID Comments User Context   11/11/2023 2108 11/13/2023 2058 Full Code [id number]  Franky Redia SAILOR, MD ED   09/15/2023 0041 09/18/2023 1716 Full Code [id number]  Hilma Rankins, MD ED   08/27/2023 0825 08/31/2023 2023 Full Code [id number]  Laurita Cort DASEN, MD ED   08/15/2023 1725 08/19/2023 2007 Full Code [id number]  Cox, Amy N, DO ED   07/21/2023 2101 07/29/2023 2214 Full Code [id number]  Cleatus Delayne GAILS, MD ED   04/06/2023 0104 04/11/2023 1946 Full Code [id number]  Tobie Mario GAILS, MD ED    03/26/2023 1650 03/31/2023 1753 Full Code [id number]  Caleen Qualia, MD ED   03/09/2023 0020 03/19/2023 2147 Full Code [id number]  Cleatus Delayne GAILS, MD ED   02/16/2023 0201 02/24/2023 2251 Full Code [id number]  Mansy, Madison LABOR, MD ED   12/29/2022 1210 12/30/2022 2055 Full Code [id number]  Kandis Devaughn Sayres, MD ED   11/13/2022 1930 11/15/2022 1804 Full Code [id number]  Arnett Saunders, MD ED   09/18/2022 2212 09/20/2022 2045 Full Code [id number]  Cleatus Delayne GAILS, MD ED   08/18/2022 2259 08/26/2022 1718 Full Code [id number]  Moody Alto, MD ED   07/13/2022 1648 07/16/2022 1739 Full Code [id number]  Lanetta Lingo, MD ED   07/13/2022 1646 07/13/2022 1648 Full Code [id number]  Lanetta Lingo, MD ED   07/09/2022 0743 07/11/2022 1642 Full Code [id number]  Eldonna Elspeth PARAS, MD ED   06/06/2022 1214 06/12/2022 2119 Full Code [id number]  Juvenal Harlene PENNER, DO ED   05/15/2022 0117 05/28/2022 2102 Full Code [id number]  Tobie Mario GAILS, MD ED   05/15/2022 0109 05/15/2022 0117 Full Code [id number]  Tobie Mario GAILS, MD ED   04/21/2022 0206 04/29/2022 2149 Full Code [id number]  Kathrene Almarie Bake, NP ED   04/07/2022 0449 04/09/2022 0015 Full Code [id number]  Cleatus Delayne GAILS, MD ED   03/09/2022 2351 03/12/2022 2244 Full Code [id number]  Tobie Mario GAILS, MD ED   02/18/2022 0134 02/25/2022 2050 Full Code [id number]  Laurence Locus, DO ED   02/18/2022 0047 02/18/2022 0134 Full Code [id number]  Laurence Locus, DO ED   12/07/2021 0047 12/09/2021 1843 Full Code [id number]  Kathrene Almarie  Achieng, NP ED   11/27/2021 0109 11/28/2021 2015 Full Code [id number]  Kathrene Almarie Bake, NP ED   08/09/2021 2113 08/11/2021 1742 Full Code [id number]  Tobie Mario GAILS, MD ED   12/06/2020 1712 12/07/2020 1953 Full Code [id number]  Cox, Amy N, DO ED   11/28/2020 2235 12/04/2020 1508 Full Code [id number]  Jeryl Mardene RAMAN, DO ED   09/12/2020 0326 09/13/2020 1803 Full Code [id number]  Mansy, Madison LABOR, MD ED   08/01/2020 1537 08/02/2020 1553 Full Code [id number]  Cox, Amy N, DO ED   11/06/2019 1905 11/07/2019 1955  Full Code [id number]  Donnamarie Lebron PARAS, MD ED   07/28/2019 0306 07/29/2019 2343 Full Code [id number]  Cleatus Delayne GAILS, MD ED   04/27/2019 0632 04/28/2019 1739 Full Code [id number]  Mansy, Madison LABOR, MD ED   04/05/2019 1936 04/07/2019 1950 Full Code [id number]  Cleatus Delayne GAILS, MD ED   04/07/2016 1032 04/08/2016 1732 Full Code [id number]  Hugelmeyer, Thersia, DO Inpatient    Thank you for allowing the Palliative Medicine Team to assist in the care of this patient.    Camelia Lewis, NP  Please contact Palliative Medicine Team phone at [phone] for questions and concerns.

## 2023-11-23 NOTE — Progress Notes (Signed)
 PT Cancellation Note  Patient Details Name: Nicholas Weiss MRN: [id number] DOB: 01-19-1951   Cancelled Treatment:     Pt OOB to chair with OT this am and currently back in bed, sitter at bedside. BP appears to have improved. Pt declined OOB with Mobility Specialist earlier, as well as this PT attempt. Will re-attempt next available date/time per POC. Mobility to check back in pm.   Darice JAYSON Bohr 11/23/2023, 12:18 PM

## 2023-11-23 NOTE — Progress Notes (Addendum)
 Progress Note   Patient: Nicholas Weiss FMW:[id number] DOB: [birth year] DOA: 11/15/2023     7 DOS: the patient was seen and examined on 11/23/2023   Brief hospital course: 73 y.o. male with medical history significant for type I DM, HLD, recent history of fall with subdural hematoma in June 2025, chronic orthostatic hypotension, A-fib on Eliquis , seizure disorder, mild cognitive impairment, EtOH use who was brought in from home after he lost consciousness, fell on the ground, has been having recurrent falls and confused at baseline.  Workup revealed intraventricular right lateral ventricle and occipital horn of right ventricle on CT scan of the brain, seen by neurosurgery recommended conservative management.  Neurology was consulted, MRI negative for acute stroke, recommend holding anticoagulation long-term due to recurrent falls and history of SDH and now with intraventricular hemorrhage.  Patient was found to be lethargic on 08/20, with CT stable  Rapid response was called on 08/22 as patient was found unresponsive, CT head stable, improved back to baseline within an hour with no intervention. Code stroke was called 08/23, CT stroke stable Consulted Cardiology for management of Orthostatic hypotension, labile  Discussed with partner Nicholas Weiss, patient is sleepy mostly during the day and up at night. Also confused at baseline  Assessment and Plan:   Intraventricular hemorrhage (HCC) -Appreciate neurosurgical consultation recommended conservative management.   -Appreciate neurological consultation.  MRI does not show any acute stroke -CTA calcified atherosclerosis at carotid bifurcations approximately 50% stenosis of proximal cervical ICAs.  Severe stenosis right vertebral artery origin. -Repeat CT 08/20 unchanged focus of hemorrhage within body of right lateral ventricle, Layering hemorrhage within the occipital horns of both lateral ventricles, slightly increased (likely related to layering/  redistribution) - Holding Eliquis  at discharge.  PT/OT eval - Rapid response was called on 08/22 as patient was unresponsive, repeat CT head No new or worsening intracranial hemorrhage - Code stroke called 08/23 - CT stroke stable - Patient is intermittently more responsive, waxing and waning mental status, worse than baseline as per partner - poor po intake, encourage po intake and hydration - Follow-up with outpatient neurology and neurosurgery in 2 weeks with repeat CT - SLP pureed diet - Dysphagia 2    Syncope and collapse, recurrent - History of orthostatic hypotension HTN - Continue midodrine  with holding parameters if SBP > 120 - Abdominal binder and TED hose - Seen by Cardiology, appreciate recs - IV fluids to maintain hydration - poor po intake, sleeping most of the day - IV Labetalol  5mg  prn if SBP > 160   Uncontrolled type 1 diabetes mellitus with hyperglycemia, with long-term current use of insulin  (HCC) Uses insulin  pump at home. will do Semglee  6u, insulin  plus sliding scale, add Novolog  2u TID with meals Labile due to varying po intake   CKD - mild bump in Cr, poor po intake - On IV fluids, Monitor Cr   Seizure disorder (HCC) Continue Keppra  and gabapentin    Anemia of chronic disease Monitor Hb intermittently   Left supracondylar humerus fracture, sequela - Last seen by Ortho 07/25 - Shoulder sling for support - Repeat X ray left shoulder pending   MCI (mild cognitive impairment) with memory loss As needed risperidol for agitation. Seroquel  at night -Outpatient neuropsychological testing On 1:1 sitter  -- Palliative care consulted for GOC - Full code and full scope of care   Subjective: Patient is lying in the bed, sleeping most times, poor po intake, on IV fluids Palliative care consulted - Full code Discussed with  Nicholas Weiss, partner at the bedside and provided updates TOC working on placement, needs to be off sitter for 48 hrs   Physical  Exam: Vitals:   11/23/23 0617 11/23/23 0822 11/23/23 1213 11/23/23 1222  BP: (!) 146/84 (!) 159/99 (!) 119/92 (!) 158/96  Pulse: 86 89  91  Resp:  18  17  Temp:  (!) 97.3 F (36.3 C)  98.3 F (36.8 C)  TempSrc:    Oral  SpO2:  100%  100%  Weight:      Height:       Cardiovascular:     Rate and Rhythm: Normal rate. Rhythm irregularly irregular.     Heart sounds: Normal heart sounds, S1 normal and S2 normal.  Pulmonary:     Breath sounds: No decreased breath sounds, wheezing, rhonchi or rales.  Abdominal:     Palpations: Abdomen is soft.     Tenderness: There is no abdominal tenderness.  Musculoskeletal:     Right lower leg: No swelling.     Left lower leg: No swelling.  Skin:    General: Skin is warm.     Findings: No rash.  Neurological:     Mental Status: sleeping mostly, answering simple questions, following simple commands Moves all extremities, LUE range restricted due to fracture   Family Communication: Called partner Nicholas Weiss, discussed updates and answered all questions  Disposition: Status is: Inpatient Remains inpatient appropriate because  Planned Discharge Destination: Skilled nursing facility  Time spent: 39 minutes   Author: Laree Lock, MD 11/23/2023 3:45 PM  For on call review [email or web address].

## 2023-11-23 NOTE — Progress Notes (Signed)
 Occupational Therapy Treatment Patient Details Name: Nicholas Weiss MRN: [id number] DOB: 06/08/1950 Today's Date: 11/23/2023   History of present illness 73 y.o. male present to hospitalafter he lost consciousness and fell on the ground.  Patient was recently admitted to the hospital between 11/11/2023 to 11/13/2023 after presenting a fall.  As noted above he had a similar incident with the fall in June 2025 and had a subdural hematoma and a fractured left upper extremity.   OT comments  Pt is supine in bed on arrival after nursing techs provided pt a bath. Pt more alert this date and agreeable to OT session. He continues to have L shoulder pain with any movement. BP with notable drop from supine to sit at EOB, but then increased with transfer to recliner and pt was not symptomatic this date. Pt required CGA for supine to sit at EOB and increased time to forward scoot. He needed Mod A for STS from EOB and Min A with cues for safety to step pivot transfer to recliner. Messaged MD regarding LUE status s/p L humeral fx in June since he is likely 8 weeks out and may be due for a follow up to see if he can start utilizing the LUE more. Pt left in recliner with all needs in place and will cont to require skilled acute OT services to maximize his safety and IND to return to PLOF.       If plan is discharge home, recommend the following:  A lot of help with bathing/dressing/bathroom;Assistance with cooking/housework;Assistance with feeding;Direct supervision/assist for medications management;Direct supervision/assist for financial management;Assist for transportation;Help with stairs or ramp for entrance;A lot of help with walking and/or transfers   Equipment Recommendations  Other (comment) (defer)    Recommendations for Other Services      Precautions / Restrictions Precautions Precautions: Fall Recall of Precautions/Restrictions: Impaired Precaution/Restrictions Comments: Assumed NWB on LUE d/t L  humerus fx-grimaces in pain to touch; orthostatic Required Braces or Orthoses: Sling Restrictions Weight Bearing Restrictions Per Provider Order: No       Mobility Bed Mobility Overal bed mobility: Needs Assistance Bed Mobility: Supine to Sit     Supine to sit: Supervision, HOB elevated, Used rails          Transfers Overall transfer level: Needs assistance Equipment used: Rolling walker (2 wheels) Transfers: Sit to/from Stand, Bed to chair/wheelchair/BSC Sit to Stand: Min assist, Mod assist, From elevated surface     Step pivot transfers: Min assist     General transfer comment: cues for hand placement and assist for lift off needed and safety with RW management, only placed LUE on walker for stability     Balance Overall balance assessment: Needs assistance Sitting-balance support: Feet supported, Single extremity supported Sitting balance-Leahy Scale: Good     Standing balance support: Bilateral upper extremity supported Standing balance-Leahy Scale: Poor Standing balance comment: min A from therapist and RW use for stability although pt continues to have pain in LUE from humeral fx in June                           ADL either performed or assessed with clinical judgement   ADL Overall ADL's : At baseline                                       General ADL Comments: has  needed assist from significant other for ADL performance, anticipate will continue to need assist d/t orthostatic hypotension issues and L humeral fx in June    Extremity/Trunk Assessment              Vision       Perception     Praxis     Communication Communication Communication: Impaired Factors Affecting Communication: Hearing impaired   Cognition Arousal: Alert Behavior During Therapy: [facility] for tasks assessed/performed Cognition: History of cognitive impairments                                 Following commands impaired: Follows  one step commands with increased time      Cueing   Cueing Techniques: Verbal cues, Tactile cues, Gestural cues  Exercises      Shoulder Instructions       General Comments continues to experience orthostatic hypotension but was not symptomatic this date    Pertinent Vitals/ Pain       Pain Assessment Pain Assessment: Faces Faces Pain Scale: Hurts little more Pain Location: L shoulder Pain Descriptors / Indicators: Sore, Guarding, Grimacing Pain Intervention(s): Monitored during session, Limited activity within patient's tolerance, Repositioned  Home Living                                          Prior Functioning/Environment              Frequency  Min 2X/week        Progress Toward Goals  OT Goals(current goals can now be found in the care plan section)  Progress towards OT goals: Progressing toward goals  Acute Rehab OT Goals OT Goal Formulation: Patient unable to participate in goal setting Time For Goal Achievement: 11/30/23 Potential to Achieve Goals: Fair  Plan      Co-evaluation                 AM-PAC OT 6 Clicks Daily Activity     Outcome Measure   Help from another person eating meals?: A Little Help from another person taking care of personal grooming?: A Little Help from another person toileting, which includes using toliet, bedpan, or urinal?: A Lot Help from another person bathing (including washing, rinsing, drying)?: A Lot Help from another person to put on and taking off regular upper body clothing?: A Little Help from another person to put on and taking off regular lower body clothing?: A Lot 6 Click Score: 15    End of Session Equipment Utilized During Treatment: Gait belt  OT Visit Diagnosis: Unsteadiness on feet (R26.81);Other symptoms and signs involving cognitive function;Muscle weakness (generalized) (M62.81);History of falling (Z91.81);Repeated falls (R29.6)   Activity Tolerance Patient tolerated  treatment well;Patient limited by fatigue   Patient Left with call bell/phone within reach;with chair alarm set;in chair   Nurse Communication Mobility status        Time: 9179-9162 OT Time Calculation (min): 17 min  Charges: OT General Charges $OT Visit: 1 Visit OT Treatments $Therapeutic Activity: 8-22 mins  Nicholas Weiss, OTR/L  11/23/23, 12:28 PM   Reni Hausner E Jacob Cicero 11/23/2023, 12:25 PM

## 2023-11-23 NOTE — Progress Notes (Addendum)
 [facility] CLINIC CARDIOLOGY PROGRESS NOTE       Patient ID: Nicholas Weiss MRN: [id number] DOB/AGE: 04-21-1950 73 y.o.  Admit date: 11/15/2023 Referring Physician Dr. Baron Primary Physician Alla Amis, MD Primary Cardiologist None Reason for Consultation possible syncope/orthostatic hypotension  HPI: Nicholas Weiss is a 73 y.o. male  with a past medical history of recent history of fall with subdural hematoma (08/2023), chronic orthostatic hypotension (on midodrine  5 mg 3 times daily), atrial fibrillation (on Eliquis ), seizure disorder, mild cognitive impairment, alcohol use, type 1 diabetes mellitus, hyperlipidemia who presented to the ED on 11/15/2023 for frequent falls and recent LOC.  He is confused at baseline at home.  Cardiology was consulted for further evaluation of labile blood pressure and orthostatic hypotension.  Interval History: -Patient seen and examined this AM and laying comfortably in hospital bed. Patient states he feels great, a lot better today. Denies chest pain, palpitations, SOB or lightheadedness.  -Patients BP and HR stable this AM. -Patient remains on room air with stable SpO2.    Review of systems complete and found to be negative unless listed above    Past Medical History:  Diagnosis Date   Acute metabolic encephalopathy 12/02/2020   DKA (diabetic ketoacidoses) 04/06/2016   Hypercholesteremia    Hypertension     Past Surgical History:  Procedure Laterality Date   AMPUTATION TOE Left 04/25/2022   Procedure: LEFT GREAT TOE AMPUTATION AND LEFT PARTIAL 2ND TOE AMPUTATION;  Surgeon: Janit Thresa HERO, DPM;  Location: [facility] ORS;  Service: Podiatry;  Laterality: Left;   COLONOSCOPY WITH PROPOFOL  N/A 02/01/2020   Procedure: COLONOSCOPY WITH PROPOFOL ;  Surgeon: Unk Corinn Skiff, MD;  Location: [facility] ENDOSCOPY;  Service: Gastroenterology;  Laterality: N/A;   ESOPHAGOGASTRODUODENOSCOPY  02/01/2020   Procedure: ESOPHAGOGASTRODUODENOSCOPY (EGD);  Surgeon:  Unk Corinn Skiff, MD;  Location: [facility] ENDOSCOPY;  Service: Gastroenterology;;   ESOPHAGOGASTRODUODENOSCOPY (EGD) WITH PROPOFOL  N/A 06/07/2022   Procedure: ESOPHAGOGASTRODUODENOSCOPY (EGD) WITH PROPOFOL ;  Surgeon: Saintclair Jasper, MD;  Location: [facility] ENDOSCOPY;  Service: Gastroenterology;  Laterality: N/A;   KNEE SURGERY Right    Torn meniscus   KNEE SURGERY Left     Medications Prior to Admission  Medication Sig Dispense Refill Last Dose/Taking   acetaminophen  (TYLENOL ) 325 MG tablet Take 2 tablets (650 mg total) by mouth every 6 (six) hours as needed for mild pain (pain score 1-3) or fever.   Taking As Needed   acyclovir  (ZOVIRAX ) 400 MG tablet Take 400 mg by mouth in the morning and at bedtime.   11/14/2023   apixaban  (ELIQUIS ) 5 MG TABS tablet Take 5 mg by mouth 2 (two) times daily.   11/14/2023   atorvastatin  (LIPITOR ) 80 MG tablet Take 1 tablet (80 mg total) by mouth at bedtime. Hold while taking Paxlovid  30 tablet 2 11/14/2023   fludrocortisone  (FLORINEF ) 0.1 MG tablet Take 1 tablet (0.1 mg total) by mouth daily as needed. Skip the dose if SBP >130 (Patient taking differently: Take 0.1 mg by mouth daily. Skip the dose if SBP >130)   11/14/2023   gabapentin  (NEURONTIN ) 300 MG capsule Take 1 capsule (300 mg total) by mouth 2 (two) times daily. 60 capsule 0 11/14/2023   insulin  lispro (HUMALOG) 100 UNIT/ML injection INJECT 150 UNITS EVERY 3 DAYS VIA INSULIN  PUMP AS DIRECTED   11/14/2023   latanoprost  (XALATAN ) 0.005 % ophthalmic solution 1 drop at bedtime.   11/14/2023   levETIRAcetam  (KEPPRA ) 500 MG tablet Take 1 tablet by mouth twice daily 60 tablet 0 11/14/2023  midodrine  (PROAMATINE ) 5 MG tablet Take 1 tablet (5 mg total) by mouth 3 (three) times daily. 90 tablet 0 11/14/2023   oxyCODONE -acetaminophen  (PERCOCET/ROXICET) 5-325 MG tablet Take 1 tablet by mouth every 6 (six) hours as needed for severe pain (pain score 7-10).   Taking As Needed   pomalidomide  (POMALYST ) 2 MG capsule Take 1 capsule (2 mg  total) by mouth daily. Take for 21 days, the hold for 7 days. Repeat every 28 days. 21 capsule 0 11/14/2023   Continuous Glucose Sensor (DEXCOM G7 SENSOR) MISC 1 each by Does not apply route 4 (four) times daily -  before meals and at bedtime. 1 each 0    iron  polysaccharides (NIFEREX) 150 MG capsule Take 1 capsule (150 mg total) by mouth daily. (Patient not taking: Reported on 11/11/2023)      Social History   Socioeconomic History   Marital status: Single    Spouse name: Not on file   Number of children: 3   Years of education: Not on file   Highest education level: High school graduate  Occupational History    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  Tobacco Use   Smoking status: Never   Smokeless tobacco: Never  Vaping Use   Vaping status: Never Used  Substance and Sexual Activity   Alcohol use: Not Currently    Alcohol/week: 0.0 - 1.0 standard drinks of alcohol    Comment: once every 2 months   Drug use: Yes    Types: Marijuana, Crack cocaine    Comment: last week   Sexual activity: Not Currently    Birth control/protection: None  Other Topics Concern   Not on file  Social History Narrative   Lives with girlfriend sharon   Social Drivers of Health   Financial Resource Strain: Low Risk  (10/20/2023)   Received from [facility]   Overall Financial Resource Strain (CARDIA)    Difficulty of Paying Living Expenses: Not hard at all  Food Insecurity: No Food Insecurity (11/16/2023)   Hunger Vital Sign    Worried About Running Out of Food in the Last Year: Never true    Ran Out of Food in the Last Year: Never true  Transportation Needs: No Transportation Needs (11/16/2023)   PRAPARE - Administrator, Civil Service (Medical): No    Lack of Transportation (Non-Medical): No  Physical Activity: Insufficiently Active (01/01/2020)   Exercise Vital Sign    Days of Exercise per Week: 7 days    Minutes of Exercise per Session: 20 min  Stress: No Stress  Concern Present (01/01/2020)   [employer] of Occupational Health - Occupational Stress Questionnaire    Feeling of Stress : Not at all  Social Connections: Moderately Isolated (11/16/2023)   Social Connection and Isolation Panel    Frequency of Communication with Friends and Family: More than three times a week    Frequency of Social Gatherings with Friends and Family: More than three times a week    Attends Religious Services: Never    Database administrator or Organizations: No    Attends Banker Meetings: Never    Marital Status: Living with partner  Intimate Partner Violence: Not At Risk (11/16/2023)   Humiliation, Afraid, Rape, and Kick questionnaire    Fear of Current or Ex-Partner: No    Emotionally Abused: No    Physically Abused: No    Sexually Abused: No    Family History  Problem Relation Age of Onset  Heart attack Father    Hypertension Sister    Cancer Sister      Vitals:   11/22/23 1937 11/23/23 0400 11/23/23 0617 11/23/23 0822  BP: (!) 81/53 (!) 141/91 (!) 146/84 (!) 159/99  Pulse: 87 80 86 89  Resp:  18  18  Temp:  98.4 F (36.9 C)  (!) 97.3 F (36.3 C)  TempSrc:      SpO2: 100% 100%  100%  Weight:      Height:        PHYSICAL EXAM General: Well-appearing elderly male, well nourished, in no acute distress. HEENT: Normocephalic and atraumatic. Neck: No JVD.   Lungs: Normal respiratory effort on room air. Clear bilaterally to auscultation. No wheezes, crackles, rhonchi.  Heart: HRRR. Normal S1 and S2 without gallops or murmurs.  Abdomen: Non-distended appearing.  Msk: Normal strength and tone for age. Extremities: Warm and well perfused. No clubbing, cyanosis, edema.  Neuro: Alert and oriented to person only. Psych: Answers questions appropriately.   Labs: Basic Metabolic Panel: Recent Labs    11/21/23 0612 11/23/23 0528  NA 136 136  K 4.5 4.3  CL 104 105  CO2 26 27  GLUCOSE 124* 122*  BUN 29* 34*  CREATININE 1.06 1.26*   CALCIUM  8.2* 8.2*   Liver Function Tests: No results for input(s): AST, ALT, ALKPHOS, BILITOT, PROT, ALBUMIN in the last 72 hours. No results for input(s): LIPASE, AMYLASE in the last 72 hours. CBC: Recent Labs    11/21/23 0612  WBC 5.2  HGB 9.6*  HCT 29.7*  MCV 98.0  PLT 249   Cardiac Enzymes: No results for input(s): CKTOTAL, CKMB, CKMBINDEX, TROPONINIHS in the last 72 hours. BNP: No results for input(s): BNP in the last 72 hours. D-Dimer: No results for input(s): DDIMER in the last 72 hours. Hemoglobin A1C: No results for input(s): HGBA1C in the last 72 hours. Fasting Lipid Panel: No results for input(s): CHOL, HDL, LDLCALC, TRIG, CHOLHDL, LDLDIRECT in the last 72 hours. Thyroid  Function Tests: No results for input(s): TSH, T4TOTAL, T3FREE, THYROIDAB in the last 72 hours.  Invalid input(s): FREET3 Anemia Panel: No results for input(s): VITAMINB12, FOLATE, FERRITIN, TIBC, IRON , RETICCTPCT in the last 72 hours.   Radiology: CT HEAD CODE STROKE WO CONTRAST` Result Date: 11/20/2023 CLINICAL DATA:  Provided history: Neuro deficit, acute, stroke suspected. EXAM: CT HEAD WITHOUT CONTRAST TECHNIQUE: Contiguous axial images were obtained from the base of the skull through the vertex without intravenous contrast. RADIATION DOSE REDUCTION: This exam was performed according to the departmental dose-optimization program which includes automated exposure control, adjustment of the mA and/or kV according to patient size and/or use of iterative reconstruction technique. COMPARISON:  Head CT 11/19/2023. FINDINGS: Brain: Generalized cerebral atrophy. A focus of hemorrhage within the body of the right lateral ventricle is unchanged as compared to yesterday's head CT, again measuring 1.7 x 1.3 cm in transaxial dimensions. Small-volume hemorrhage within the occipital horns of both lateral ventricles, also unchanged. Stable size and  configuration of the ventricular system. No evidence of an interval acute intracranial hemorrhage. Patchy and ill-defined hypoattenuation within the cerebral white matter, nonspecific but compatible with moderate chronic small vessel ischemic disease. Prominent dural calcifications. No demarcated cortical infarct. No extra-axial fluid collection. No midline shift. Vascular: No hyperdense vessel.  Atherosclerotic calcifications. Skull: No calvarial fracture or aggressive osseous lesion. Sinuses/Orbits: No mass or acute finding within the imaged orbits. Minimal mucosal thickening within the right maxillary sinus at the imaged levels. Tiny mucous retention cyst  within the left maxillary sinus at the imaged levels. No evidence of an interval acute intracranially as compared yesterday's head CT. These results were communicated to Dr. Voncile at 3:48 pmon 8/23/2025by text page via the [facility] messaging system. IMPRESSION: 1. 1.7 x 1.3 cm focus of hemorrhage within the body of the right lateral ventricle, unchanged from yesterday's head CT. 2. Small-volume hemorrhage within the occipital horns of both lateral ventricles, also unchanged. 3. Stable size and configuration of the ventricular system. 4. No evidence of an interval acute intracranial hemorrhage. 5. Moderate chronic small vessel ischemic changes within the cerebral white matter. 6. Generalized cerebral atrophy Electronically Signed   By: Rockey Childs D.O.   On: 11/20/2023 15:48   CT HEAD WO CONTRAST ( ) Result Date: 11/19/2023 CLINICAL DATA:  Mental status change. Follow-up intracranial hemorrhage EXAM: CT HEAD WITHOUT CONTRAST TECHNIQUE: Contiguous axial images were obtained from the base of the skull through the vertex without intravenous contrast. RADIATION DOSE REDUCTION: This exam was performed according to the departmental dose-optimization program which includes automated exposure control, adjustment of the mA and/or kV according to patient size and/or use  of iterative reconstruction technique. COMPARISON:  11/17/2023.  Older studies as distant as 11/11/2023. FINDINGS: Brain: No focal abnormality affects the brainstem or cerebellum. Focal clot within the right lateral ventricle persists and is slightly smaller, measuring 13 x 17 mm today. Some layering blood dependent in the atria and occipital horns of the lateral ventricles, less by volume. Ventricular size is stable without developing hydrocephalus. No intraparenchymal hemorrhage. No subdural hematoma. Mild chronic small-vessel ischemic changes affect the cerebral hemispheric white matter. Patient has a tendency towards benign dural calcification. Vascular: There is atherosclerotic calcification of the major vessels at the base of the brain. Skull: Negative Sinuses/Orbits: Clear/normal Other: None IMPRESSION: Focal clot within the right lateral ventricle persists and is slightly smaller, measuring 13 x 17 mm today. Some layering blood dependent in the atria and occipital horns of the lateral ventricles, less by volume. Ventricular size is stable without developing hydrocephalus. No new or worsening intracranial hemorrhage. Electronically Signed   By: Oneil Officer M.D.   On: 11/19/2023 16:42   CT HEAD WO CONTRAST ( ) Result Date: 11/17/2023 CLINICAL DATA:  Provided history: Stroke, hemorrhagic. Intraventricular hemorrhage. EXAM: CT HEAD WITHOUT CONTRAST TECHNIQUE: Contiguous axial images were obtained from the base of the skull through the vertex without intravenous contrast. RADIATION DOSE REDUCTION: This exam was performed according to the departmental dose-optimization program which includes automated exposure control, adjustment of the mA and/or kV according to patient size and/or use of iterative reconstruction technique. COMPARISON:  Non-contrast head CT and CT angiogram head/neck 11/16/2023. FINDINGS: Brain: Generalized cerebral atrophy. The focus of hemorrhage within the body of the right lateral  ventricle has not significantly changed, again measuring 2.3 x 1.2 cm. Layering hemorrhage within the occipital horns of both lateral ventricles, slightly increased. Unchanged size and configuration of the ventricular system. Patchy and ill-defined hypoattenuation within the cerebral white matter, nonspecific but compatible with moderate chronic small vessel ischemic disease. No demarcated cortical infarct. No extra-axial fluid collection. No evidence of an intracranial mass. No midline shift. Vascular: No hyperdense vessel.  Atherosclerotic calcifications. Skull: No calvarial fracture or aggressive osseous lesion. Sinuses/Orbits: No mass or acute finding within the imaged orbits. Trace mucosal thickening within the left maxillary sinus at the imaged levels. Impression #2 will be called to the ordering clinician or representative by the Radiologist Assistant, and communication documented in the PACS or [facility]  Dashboard. IMPRESSION: 1. Unchanged focus of hemorrhage within the body of the right lateral ventricle, again measuring 2.3 x 1.2 cm. 2. Layering hemorrhage within the occipital horns of both lateral ventricles, slightly increased. 3. Unchanged size and configuration of the ventricular system. 4. Moderate chronic small vessel ischemic changes within the cerebral white matter. 5. Generalized cerebral atrophy. Electronically Signed   By: Rockey Childs D.O.   On: 11/17/2023 10:52   CT ANGIO HEAD NECK W WO CM Result Date: 11/16/2023 EXAM: CTA HEAD AND NECK WITHOUT AND WITH 11/16/2023 03:43:50 PM TECHNIQUE: CTA of the head and neck was performed without and with the administration of 75 mL of intravenous iohexol  (OMNIPAQUE ) 350 MG/ML contrast. Multiplanar 2D and/or 3D reformatted images are provided for review. Automated exposure control, iterative reconstruction, and/or weight based adjustment of the mA/kV was utilized to reduce the radiation dose to as low as reasonably achievable. Stenosis of the internal  carotid arteries measured using NASCET criteria. COMPARISON: Same day MRI head and CT head 11/15/23 and CTA head and neck 02/15/23. CLINICAL HISTORY: Stroke, hemorrhagic; Isolated IVH, likely traumatic due to fall and coagulopathy from Eliquis , evaluate for vascular malformation. FINDINGS: CTA NECK: AORTIC ARCH AND ARCH VESSELS: Mild atherosclerosis of the visualized aortic arch. No dissection or arterial injury. No significant stenosis of the brachiocephalic or subclavian arteries. CERVICAL CAROTID ARTERIES: Calcified atherosclerosis at the right carotid bifurcation resulting in approximately 50% stenosis of the proximal right cervical ICA, similar to prior. Additional calcified atherosclerosis at the left carotid bifurcation resulting in approximately 50% stenosis of the proximal left cervical ICA, similar to prior. No dissection or arterial injury. CERVICAL VERTEBRAL ARTERIES: The vertebral arteries are patent from the origins to the vertebrobasilar confluence. Severe stenosis at the right vertebral artery origin. No dissection or arterial injury. LUNGS AND MEDIASTINUM: Unremarkable. SOFT TISSUES: No acute abnormality. BONES: Degenerative changes in the spine with disc space narrowing greatest at C5-6 and C6-7. No acute abnormality. CTA HEAD: ANTERIOR CIRCULATION: The intracranial internal carotid arteries appear patent to the ICA termini. There is atherosclerosis of the carotid siphons without evidence of high-grade stenosis. The MCA's are patent proximally. There is limited evaluation of distal MCA branches due to motion artifact. The right A1 segment is hypoplastic. ACA are otherwise patent proximally. Distal ACA branch is not well visualized. No aneurysm. POSTERIOR CIRCULATION: The vertebral artery is patent. Posterior cerebral arteries are patent bilaterally with limited evaluation of the distal branches. No significant stenosis of the basilar artery. No aneurysm. OTHER: No dural venous sinus thrombosis on  this non-dedicated study. Focus of intracranial hemorrhage within the body of the right lateral ventricle appears slightly increased compared to the recent prior CT. Trace layering blood products in the occipital horns noted. No midline shift. Nonspecific hypoattenuation in the periventricular and subcortical white matter, most likely representing chronic small vessel disease. Evaluation of intracranial arterial vasculature is limited due to motion artifact. IMPRESSION: 1. Examination limited by motion artifact. Within this limitation, no evidence of vascular malformation. 2. Calcified atherosclerosis at the carotid bifurcations resulting in approximately 50% stenosis of the proximal cervical ICAs, similar to prior. 3. Severe stenosis at the right vertebral artery origin. 4. Mild atherosclerosis of the visualized aortic arch. Electronically signed by: Donnice Mania MD 11/16/2023 04:12 PM EDT RP Workstation: [id number]   MR BRAIN WO CONTRAST Result Date: 11/16/2023 EXAM: MRI BRAIN WITHOUT CONTRAST 11/16/2023 11:20:00 AM TECHNIQUE: Multiplanar multisequence MRI of the head/brain was performed without the administration of intravenous contrast. COMPARISON: None available. CLINICAL  HISTORY: 73 y.o. male with hx of type 1 diabetes, chronic orthostatic hypotension, paroxysmal atrial fibrillation on Eliquis , seizures, mild cognitive impairment, recent fall, and subdural hematoma in June 2025. Presented to emergency department after a fall with intraventricular hemorrhage. FINDINGS: BRAIN AND VENTRICLES: No acute infarct. No mass. No midline shift. The ventricles are similar in caliber to the prior CT. T2/FLAIR intensity in the periventricular and subcortical white matter suggestive of mild-to-moderate chronic microvascular ischemic changes. Mild volume loss. Chronic microhemorrhage in the left frontal lobe. Additional susceptibility along the falx and tentorium corresponding to areas of mineralization on CT. Focal signal  abnormality and susceptibility within the body of the right lateral ventricle corresponding to intraventricular blood products noted on CT. Additional scattered areas of susceptibility within the body of the left lateral ventricle with additional layering susceptibility in the occipital horns compatible with known intraventricular blood products accounting for differences in modality. ORBITS: No acute abnormality. SINUSES AND MASTOIDS: Mild mucosal thickening in the maxillary sinuses. BONES AND SOFT TISSUES: Normal marrow signal. No acute soft tissue abnormality. IMPRESSION: 1. Intraventricular blood products in the lateral ventricles, similar to recent CT. 2. No midline shift. 3. Similar caliber of the ventricles. 4. Mild-to-moderate chronic microvascular ischemic changes. Mild volume loss. Electronically signed by: Donnice Mania MD 11/16/2023 12:15 PM EDT RP Workstation: [id number]   ECHOCARDIOGRAM COMPLETE Result Date: 11/16/2023    ECHOCARDIOGRAM REPORT   Patient Name:   COEN MIYASATO Date of Exam: 11/16/2023 Medical Rec #:  [id number]       Height:       70.0 in Accession #:    [id number]      Weight:       136.2 lb Date of Birth:  [birth year]      BSA:          1.773 m Patient Age:    72 years        BP:           164/86 mmHg Patient Gender: M               HR:           62 bpm. Exam Location:  [facility] Procedure: 2D Echo, Cardiac Doppler and Color Doppler (Both Spectral and Color            Flow Doppler were utilized during procedure). Indications:     Stroke I63.9  History:         Patient has prior history of Echocardiogram examinations, most                  recent 03/10/2023.  Sonographer:     Rosina Dunk Referring Phys:  [id number] [facility]UDEL Diagnosing Phys: Lonni Hanson MD  Sonographer Comments: No subcostal window. IMPRESSIONS  1. Left ventricular ejection fraction, by estimation, is 65 to 70%. The left ventricle has normal function. Left ventricular endocardial border not optimally defined  to evaluate regional wall motion. There is moderate left ventricular hypertrophy. Left ventricular diastolic function could not be evaluated.  2. Right ventricular systolic function is normal. The right ventricular size is normal.  3. A small pericardial effusion is present.  4. The mitral valve is degenerative. Trivial mitral valve regurgitation. No evidence of mitral stenosis.  5. The aortic valve was not well visualized. There is moderate calcification of the aortic valve. There is moderate thickening of the aortic valve. Aortic valve regurgitation is not visualized. Aortic valve sclerosis/calcification is present, without any evidence of aortic stenosis.  6. Rhythm strip during this exam demonstrates atrial fibrillation. FINDINGS  Left Ventricle: Left ventricular ejection fraction, by estimation, is 65 to 70%. The left ventricle has normal function. Left ventricular endocardial border not optimally defined to evaluate regional wall motion. The left ventricular internal cavity size was normal in size. There is moderate left ventricular hypertrophy. Left ventricular diastolic function could not be evaluated due to atrial fibrillation. Left ventricular diastolic function could not be evaluated. Right Ventricle: The right ventricular size is normal. No increase in right ventricular wall thickness. Right ventricular systolic function is normal. Left Atrium: Left atrial size was normal in size. Right Atrium: Right atrial size was normal in size. Pericardium: A small pericardial effusion is present. Mitral Valve: The mitral valve is degenerative in appearance. There is mild thickening of the mitral valve leaflet(s). There is mild calcification of the mitral valve leaflet(s). Trivial mitral valve regurgitation. No evidence of mitral valve stenosis. MV peak gradient, 10.6 mmHg. The mean mitral valve gradient is 4.0 mmHg. Tricuspid Valve: The tricuspid valve is not well visualized. Tricuspid valve regurgitation is  trivial. Aortic Valve: The aortic valve was not well visualized. There is moderate calcification of the aortic valve. There is moderate thickening of the aortic valve. Aortic valve regurgitation is not visualized. Aortic valve sclerosis/calcification is present, without any evidence of aortic stenosis. Aortic valve mean gradient measures 3.0 mmHg. Aortic valve peak gradient measures 5.8 mmHg. Aortic valve area, by VTI measures 2.51 cm. Pulmonic Valve: The pulmonic valve was not well visualized. Pulmonic valve regurgitation is not visualized. No evidence of pulmonic stenosis. Aorta: The aortic root is normal in size and structure. Pulmonary Artery: The pulmonary artery is not well seen. IAS/Shunts: The interatrial septum was not well visualized. EKG: Rhythm strip during this exam demonstrates atrial fibrillation.  LEFT VENTRICLE PLAX 2D LVIDd:         3.60 cm     Diastology LVIDs:         2.00 cm     LV e' medial:    10.00 cm/s LV PW:         1.40 cm     LV E/e' medial:  7.3 LV IVS:        1.20 cm     LV e' lateral:   14.40 cm/s LVOT diam:     2.20 cm     LV E/e' lateral: 5.1 LV SV:         55 LV SV Index:   31 LVOT Area:     3.80 cm  LV Volumes (MOD) LV vol d, MOD A2C: 29.5 ml LV vol d, MOD A4C: 35.2 ml LV vol s, MOD A2C: 11.5 ml LV vol s, MOD A4C: 16.2 ml LV SV MOD A2C:     18.0 ml LV SV MOD A4C:     35.2 ml LV SV MOD BP:      19.8 ml RIGHT VENTRICLE RV Basal diam:  2.70 cm RV Mid diam:    2.40 cm RV S prime:     12.00 cm/s TAPSE (M-mode): 1.7 cm LEFT ATRIUM             Index        RIGHT ATRIUM          Index LA diam:        3.50 cm 1.97 cm/m   RA Area:     9.41 cm LA Vol (A2C):   45.8 ml 25.83 ml/m  RA Volume:   16.70  ml 9.42 ml/m LA Vol (A4C):   52.5 ml 29.61 ml/m LA Biplane Vol: 53.6 ml 30.23 ml/m  AORTIC VALVE                    PULMONIC VALVE AV Area (Vmax):    2.28 cm     PV Vmax:        1.23 m/s AV Area (Vmean):   2.28 cm     PV Vmean:       81.600 cm/s AV Area (VTI):     2.51 cm     PV VTI:          0.197 m AV Vmax:           120.00 cm/s  PV Peak grad:   6.1 mmHg AV Vmean:          78.500 cm/s  PV Mean grad:   3.0 mmHg AV VTI:            0.220 m      RVOT Peak grad: 5 mmHg AV Peak Grad:      5.8 mmHg AV Mean Grad:      3.0 mmHg LVOT Vmax:         71.90 cm/s LVOT Vmean:        47.100 cm/s LVOT VTI:          0.145 m LVOT/AV VTI ratio: 0.66  AORTA Ao Root diam: 3.50 cm MITRAL VALVE MV Area (PHT): 3.33 cm    SHUNTS MV Area VTI:   1.64 cm    Systemic VTI:  0.14 m MV Peak grad:  10.6 mmHg   Systemic Diam: 2.20 cm MV Mean grad:  4.0 mmHg    Pulmonic VTI:  0.165 m MV Vmax:       1.63 m/s MV Vmean:      84.6 cm/s MV Decel Time: 228 msec MV E velocity: 73.10 cm/s Lonni Hanson MD Electronically signed by Lonni Hanson MD Signature Date/Time: 11/16/2023/10:47:56 AM    Final    CT HEAD WO CONTRAST ( ) Result Date: 11/15/2023 EXAM: CT HEAD WITHOUT CONTRAST 11/15/2023 01:39:27 PM TECHNIQUE: CT of the head was performed without the administration of intravenous contrast. Automated exposure control, iterative reconstruction, and/or weight based adjustment of the mA/kV was utilized to reduce the radiation dose to as low as reasonably achievable. COMPARISON: CT head earlier same day. CLINICAL HISTORY: Head trauma, intracranial arterial injury suspected; repeat 6 hour. FINDINGS: BRAIN AND VENTRICLES: There is a similar focus of intraventricular hemorrhage within the body of the right lateral ventricle which is similar to prior. Additional smaller focus of hemorrhage within the body of the left lateral ventricle is slightly decreased in size. There is a small focus of hemorrhage extending into the right occipital horn likely reflecting a redistribution of blood products. The hemorrhage in the left occipital horn seen on prior CT is not well visualized. There are no areas of parenchymal hemorrhage visualized. No midline shift. Similar dural calcifications. Mild probable volume loss. ORBITS: No acute abnormality.  SINUSES: No acute abnormality. SOFT TISSUES AND SKULL: No acute soft tissue abnormality. No skull fracture. IMPRESSION: 1. Similar focus of intraventricular hemorrhage within the body of the right lateral ventricle. 2. Additional smaller focus of hemorrhage within the body of the left lateral ventricle, slightly decreased in size. 3. Small focus of hemorrhage in the right occipital horn is new from prior, possibly redistribution of blood products. Electronically signed by: Donnice Mania MD 11/15/2023 02:15 PM EDT RP  Workstation: [id number]   CT HEAD WO CONTRAST ( ) Addendum Date: 11/15/2023 ADDENDUM REPORT: 11/15/2023 08:00 ADDENDUM: Study discussed by telephone with Dr. Ernest in the ED on 11/15/2023 at 0755 hours. Electronically Signed   By: VEAR Hurst M.D.   On: 11/15/2023 08:00   Result Date: 11/15/2023 CLINICAL DATA:  73 year old male with syncope, fall with pain. History of multiple myeloma. EXAM: CT HEAD WITHOUT CONTRAST TECHNIQUE: Contiguous axial images were obtained from the base of the skull through the vertex without intravenous contrast. RADIATION DOSE REDUCTION: This exam was performed according to the departmental dose-optimization program which includes automated exposure control, adjustment of the mA and/or kV according to patient size and/or use of iterative reconstruction technique. COMPARISON:  Recent noncontrast head CT 11/11/2023. Brain MRI 10/18/2022. FINDINGS: Brain: Small volume right greater than left lateral intraventricular hemorrhage (series 3, image 17, coronal image 34) is new. No definite transependymal extension of blood. No ventriculomegaly. Trace layering occipital horn blood on the left is also new series 3, image 14. No other acute intracranial hemorrhage identified. No intracranial mass effect or midline shift. Stable gray-white matter differentiation throughout the brain. No cortically based acute infarct identified. Incidental bulky chronic dural calcifications. Vascular:  Calcified atherosclerosis at the skull base. No suspicious intracranial vascular hyperdensity. Skull: Appears stable and intact. No destructive osseous lesion identified. Sinuses/Orbits: Visualized paranasal sinuses and mastoids are stable and well aerated. Other: No acute orbit or scalp soft tissue injury identified. ---------------------------------------------------- Traumatic Brain Injury Risk Stratification Skull Fracture: No - Low/mBIG 1 Subdural Hematoma (SDH): No - Low Subarachnoid Hemorrhage [facility]): No Epidural Hematoma (EDH): No - Low/mBIG 1 Cerebral contusion, intra-axial, intraparenchymal Hemorrhage (IPH): No Intraventricular Hemorrhage (IVH): Yes - High/mBIG 3 Midline Shift > 1mm or Edema/effacement of sulci/vents: No - Low/mBIG 1 ---------------------------------------------------- IMPRESSION: 1. Positive for small volume acute intraventricular hemorrhage, greater on the right and likely posttraumatic. No associated ventriculomegaly. 2. No other acute intracranial abnormality or acute traumatic injury identified. Electronically Signed: By: VEAR Hurst M.D. On: 11/15/2023 07:48   CT Cervical Spine Wo Contrast Result Date: 11/15/2023 CLINICAL DATA:  Syncope with fall.  Left arm pain. EXAM: CT CERVICAL SPINE WITHOUT CONTRAST TECHNIQUE: Multidetector CT imaging of the cervical spine was performed without intravenous contrast. Multiplanar CT image reconstructions were also generated. RADIATION DOSE REDUCTION: This exam was performed according to the departmental dose-optimization program which includes automated exposure control, adjustment of the mA and/or kV according to patient size and/or use of iterative reconstruction technique. COMPARISON:  08/27/2023 FINDINGS: Alignment: Trace degenerative retrolisthesis of C5 on 6 and C6 on 7 with corresponding loss of disc height at both levels. No evidence for traumatic subluxation. Skull base and vertebrae: No acute fracture. No primary bone lesion or focal  pathologic process. Soft tissues and spinal canal: No prevertebral fluid or swelling. No visible canal hematoma. Disc levels: Loss of disc height noted C5-6 and C6-7. Facets are well aligned bilaterally with some degenerative left-sided facet disease in the mid cervical levels. Upper chest: Unremarkable. Other: None. IMPRESSION: Degenerative changes in the cervical spine without evidence for an acute fracture or traumatic subluxation. Electronically Signed   By: Camellia Candle M.D.   On: 11/15/2023 07:43   DG Chest Portable 1 View Result Date: 11/15/2023 CLINICAL DATA:  72 year old male with syncope, fall with pain. History of multiple myeloma. EXAM: PORTABLE CHEST 1 VIEW COMPARISON:  Portable chest 11/11/2023. FINDINGS: Portable AP semi upright view at 0621 hours. Partially visible fracture of the proximal left humerus shaft, medial  displacement, this area not included on recent comparisons. Normal lung volumes and mediastinal contours. Visualized tracheal air column is within normal limits. Allowing for portable technique the lungs are clear. Negative visible bowel gas pattern. No other No acute osseous abnormality identified. IMPRESSION: 1. Partially visible displaced proximal left humerus fracture. 2. No acute cardiopulmonary abnormality. Electronically Signed   By: VEAR Hurst M.D.   On: 11/15/2023 06:45   DG Pelvis Portable Result Date: 11/15/2023 CLINICAL DATA:  73 year old male with syncope, fall with pain. History of multiple myeloma. EXAM: PORTABLE PELVIS 1-2 VIEWS COMPARISON:  Abdominal radiographs 08/30/2023 and earlier. FINDINGS: Portable AP supine view at 0620 hours. Pelvis bone mineralization appears stable and within normal limits. Femoral heads appear located. No pelvis fracture identified. Grossly intact proximal femurs. Calcified iliofemoral atherosclerosis. Nonobstructed bowel-gas pattern. IMPRESSION: No acute fracture or dislocation identified about the pelvis. Electronically Signed   By: VEAR Hurst M.D.   On: 11/15/2023 06:43   DG Chest Port 1 View Result Date: 11/11/2023 EXAM: 1 VIEW XRAY OF THE CHEST 11/11/2023 03:33:00 PM COMPARISON: AP radiograph of the chest dated 09/23/2023. CLINICAL HISTORY: Fall trauma. FINDINGS: LUNGS AND PLEURA: No focal pulmonary opacity. No pulmonary edema. No pleural effusion. No pneumothorax. HEART AND MEDIASTINUM: No acute abnormality of the cardiac and mediastinal silhouettes. BONES AND SOFT TISSUES: No acute osseous abnormality. IMPRESSION: 1. No acute process. Electronically signed by: evalene coho 11/11/2023 03:58 PM EDT RP Workstation: [id number]   CT Cervical Spine Wo Contrast Result Date: 11/11/2023 EXAM: CT CERVICAL SPINE WITHOUT CONTRAST 11/11/2023 03:50:08 PM TECHNIQUE: CT of the cervical spine was performed without the administration of intravenous contrast. Multiplanar reformatted images are provided for review. Automated exposure control, iterative reconstruction, and/or weight based adjustment of the mA/kV was utilized to reduce the radiation dose to as low as reasonably achievable. COMPARISON: CT of the cervical spine dated 08/27/2023. CLINICAL HISTORY: Neck pain, acute, no red flags. PT BIB [facility] from home. PT was outside and fell. Fall was witnessed by son who states he hit his head. PT denies hitting head. PT is slightly confused which is not new but no dx of dementia. In the last 12 hrs. PT reportedly pulled out more of the wiring in his insulin  pump. CBG 598 on scene and minimal fluids given. FINDINGS: CERVICAL SPINE: BONES AND ALIGNMENT: No acute fracture or traumatic malalignment. DEGENERATIVE CHANGES: Slight degenerative ventralisthesis at C3-4. Chronic degenerative disc disease at C5-6 and C6-7 with mild central spinal canal stenosis at both levels. SOFT TISSUES: No prevertebral soft tissue swelling. VASCULATURE: Moderate calcification within the carotid bulbs bilaterally. IMPRESSION: 1. No acute abnormality of the cervical spine related to  the reported fall. 2. Chronic degenerative disc disease at C5-6 and C6-7 with mild central spinal canal stenosis at both levels. Electronically signed by: evalene coho 11/11/2023 03:57 PM EDT RP Workstation: [id number]   CT Head Wo Contrast Result Date: 11/11/2023 EXAM: CT HEAD WITHOUT CONTRAST 11/11/2023 03:50:08 PM TECHNIQUE: CT of the head was performed without the administration of intravenous contrast. Automated exposure control, iterative reconstruction, and/or weight based adjustment of the mA/kV was utilized to reduce the radiation dose to as low as reasonably achievable. COMPARISON: CT of the head dated 09/14/2023. CLINICAL HISTORY: Head trauma, moderate-severe. PT BIB [facility] from home. PT was outside and fell. Fall was witnessed by son who states he hit his head. PT denies hitting head. PT is slightly confused which is not new but no dx of dementia. In the last 12 hrs. PT  reportedly pulled out more of the wiring in his insulin  pump. CBG 598 on scene and minimal fluids given. FINDINGS: BRAIN AND VENTRICLES: No acute hemorrhage. Gray-white differentiation is preserved. No hydrocephalus. No extra-axial collection. No mass effect or midline shift. Age-related cerebral volume loss and mild periventricular white matter disease. ORBITS: No acute abnormality. SINUSES: No acute abnormality. SOFT TISSUES AND SKULL: No acute soft tissue abnormality. No skull fracture. Extensive dystrophic calcifications along the falx. Moderate vascular calcifications. The previously described subdural hematoma has resolved. IMPRESSION: 1. No acute intracranial abnormality related to the head trauma. 2. Age-related cerebral volume loss and mild periventricular white matter disease. 3. Extensive dystrophic calcifications along the falx and moderate vascular calcifications. Electronically signed by: evalene coho 11/11/2023 03:54 PM EDT RP Workstation: [id number]   ECHO as above.  TELEMETRY reviewed by me 11/23/2023: Not on  telemetry.  EKG reviewed by me: Narrow complex regular, AV accelerated junctional rhythm, rate 84 bpm  Data reviewed by me 11/23/2023: last 24h vitals tele labs imaging I/O hospitalist progress notes.  Principal Problem:   Intraventricular hemorrhage (HCC) Active Problems:   Anemia of chronic disease   Syncope and collapse, recurrent   Hyponatremia   Uncontrolled type 1 diabetes mellitus with hyperglycemia, with long-term current use of insulin  (HCC)   Seizure disorder (HCC)   MCI (mild cognitive impairment) with memory loss   Fall   Left supracondylar humerus fracture, sequela   Atrial fibrillation, chronic (HCC)    ASSESSMENT AND PLAN:   Nicholas Weiss is a 73 y.o. male  with a past medical history of recent history of fall with subdural hematoma (08/2023), chronic orthostatic hypotension (on midodrine  5 mg 3 times daily), atrial fibrillation (on Eliquis ), seizure disorder, mild cognitive impairment, alcohol use, type 1 diabetes mellitus, hyperlipidemia who presented to the ED on 11/15/2023 for frequent falls and recent LOC.  He is confused at baseline at home.  Cardiology was consulted for further evaluation of labile blood pressure and orthostatic hypotension.  # Orthostatic hypotension # Possible syncope? # Hyperlipidemia # Mild cognitive impairment -Monitor and replenish electrolytes for a goal K >4, Mag >2  -Recommend adequate hydration/p.o. intake. Recommend leg stockings with ambulation. -Continue midodrine  2.5 mg 3 times daily. -Continue home atorvastatin  80 mg daily. -Continue with conservative cardiac management. -Recommend using PRN hydralazine  for intermittent elevated SBP. Otherwise do not recommend daily antihypertensive medications.  No further cardiac recommendations at this time. Cardiology will sign off. Please haiku with questions or re-engage if needed.   This patient's plan of care was discussed and created with Dr. Wilburn and he is in  agreement.  Signed: Dorene Comfort, PA-C  11/23/2023, 10:23 AM [facility]

## 2023-11-23 NOTE — Progress Notes (Signed)
 Mobility Specialist - Progress Note     11/23/23 1400  Orthostatic Sitting  BP- Sitting 97/89  Orthostatic Standing at 0 minutes  BP- Standing at 0 minutes (!) 80/69  Mobility  Activity Stood at bedside;Dangled on edge of bed;Turned to right side;Turned to left side;Turned to back - supine  Level of Assistance Moderate assist, patient does 50-74%  Assistive Device Front wheel walker  Range of Motion/Exercises Active  Activity Response Tolerated well  Mobility Referral Yes  Mobility visit 1 Mobility  Mobility Specialist Start Time (ACUTE ONLY) 1347  Mobility Specialist Stop Time (ACUTE ONLY) 1413  Mobility Specialist Time Calculation (min) (ACUTE ONLY) 26 min   Pt resting in bed on RA upon entry. Pt moved to EOB ModA and was lethargic initially but, became more aware (to self)/ vocal as the session went on. Pt BP recorded 97/89. Pt STS Mod/MaxA to rolling walker at bedside BP 80/69. Pt only able to tolerate standing for 20 seconds. Pt became dizzy and sat back down. Pt unable to hold self up and returned to bed. Pt linen removed and hygiene clean performed in bed. Pt left in bed with needs in reach and bed alarm activated.   Guido Rumble Mobility Specialist 11/23/23, 2:26 PM

## 2023-11-23 NOTE — Progress Notes (Signed)
 Speech Language Pathology Treatment: Dysphagia  Patient Details Name: Nicholas Weiss MRN: [id number] DOB: 26-Mar-1951 Today's Date: 11/23/2023 Time: 9084-9054 SLP Time Calculation (min) (ACUTE ONLY): 30 min  Assessment / Plan / Recommendation Clinical Impression  Pt seen for ongoing assessment of swallowing and toleration of recently upgraded diet- now a Dysphagia level 2 (minced foods) w/ thin liquids. Suspect Baseline Cognitive decline/Dementia- need for neuropsych f/u for assessment/dx post D/C. Per H&P: Cognitive deficits have been ongoing and confirmed with family. Pt has follow-up with neurologist as per the family report in the next few weeks for further workup.; (Per D/C Summary on 11/13/23). Pt on RA, afebrile. NSG reported pt has done well w/ the upgraded diet(level 2). Sitter in room monitoring pt secondary to behaviors per report.   Pt appears to present w/ mild oral phase dysphagia in setting of Edentulous status and overall Cognitive decline; no overt pharyngeal phase swallowing issues noted w/ po intake during his breakfast meal. Pt fed self w/ Setup Support.  Pt consumed po trials w/ No overt, clinical s/s of aspiration during po trials. Pt appears at reduced risk for aspiration following general aspiration precautions and when using a modified diet consistency. Pt has challenging factors that could impact oropharyngeal swallowing to include Cognitive decline/Confusion, deconditioning/weakness, Edentulous status, and need for Supervision during self-feeding at meals secondary to generalized decreased awareness. These factors can increase risk for aspiration, dysphagia as well as decreased oral intake overall.    During po trials of thin liquids, purees, and minced/moistened solids, pt consumed all consistencies w/ no overt coughing, decline in vocal quality, or change in respiratory presentation during/post trials. Oral phase appeared impacted by the Cognitive decline and Edentulous  status c/b min increased vertical/munching pattern and min lengthier oral phase w/ trials of minced/moistened solids. Pt did not exhibit significant nor gross oral phase deficits- just seemed to need Time for mashing/gumming the foods. Noted slight impulsivity w/ self-feeding to allow Time for the bolus management (for mastication) and oral clearing BETWEEN bites. Encouraged pt to moisten foods and alternate b/t to aid oral clearing.  OF NOTE: Pt fed self w/ setup support. This aids Cognitive awareness and safety during the task.    Recommend continue a MINCED foods diet(Dysphagia level 2) w/ well-moistened foods; Thin liquids -- monitor straw use, and pt should help to Hold Cup when drinking. Recommend general aspiration precautions including REDUCE distractions at meals, Small bites/sips Slowly. Encouraged self-feeding but give Support and Supervision during meals. Pills CRUSHED in Puree for safer, easier swallowing -- it was encouraged now and for D/C to next venue of care.  Education given on Pills in Puree; food consistencies and easy to eat options; general aspiration precautions and self-feeding to pt- unsure of pt's level of comprehension.    Suspect pt is at his Baseline and would benefit from this consistency of diet to Best meet his oral intake/nutrition needs in setting of the Cognitive decline and Edentulous status.   MD/NSG to reconsult if any new needs arise while admitted. NSG updated, agreed. MD updated. Recommend Dietician f/u for support. Palliative Care for f/u w/ GOC re: discussion of Impact of Cognitive decline on oral intake/swallowing/nutrition moving forward.      HPI HPI: Pt is a 43 male who presented to ED today following syncopal episode. Pt recently admitted to hospital s/p fall. Pt with hx of recurrent falls, SDH, LUE fx, hyperosmolar nonketotic status, DMI with poor glycemic control, acute on chronic CKD, orthostatic hypotension, a-fb, sz, mult  myeloma, HLC, MCI. Per chart  review, cognitive deficits have been ongoing and confirmed with family.  Has follow-up with neurologist as per the family in the next few weeks for further workup. (Per D/C Summary on 11/13/23). Head CT, 11/15/23, 1. Similar focus of intraventricular hemorrhage within the body of the right  lateral ventricle.  2. Additional smaller focus of hemorrhage within the body of the left lateral  ventricle, slightly decreased in size.  3. Small focus of hemorrhage in the right occipital horn is new from prior,  possibly redistribution of blood products..   Patient's wife provided the history that she heard him fall, stepped out of the room and found him lying down with facedown forward.  Patient was unconscious about 5 to 10 minutes.  No convulsions.  She also stated that he has a frequent falls.  No history of recent infections, cough cold, fever, sick contact.  Patient normally ambulates with a walker at home.  He is Confused at Baseline at home.  Patient was recently admitted to the hospital between 11/11/2023 to 11/13/2023 after presenting a fall.  As noted above he had a similar incident with the fall in June 2025 and had a subdural hematoma and a fractured left upper extremity.  He also had insulin  pump malfunction and presented with nonketotic hyperosmolar state in August 2025.      SLP Plan  All goals met          Recommendations  Diet recommendations: Dysphagia 2 (fine chop);Thin liquid (added purees) Liquids provided via: Cup;Straw (monitor) Medication Administration: Crushed with puree Supervision: Patient able to self feed;Staff to assist with self feeding;Intermittent supervision to cue for compensatory strategies Compensations: Minimize environmental distractions;Slow rate;Small sips/bites;Lingual sweep for clearance of pocketing;Follow solids with liquid Postural Changes and/or Swallow Maneuvers: Out of bed for meals;Seated upright 90 degrees;Upright 30-60 min after meal                  (Dietician; Palliative Care following for GOC) Oral care BID;Oral care before and after PO;Staff/trained caregiver to provide oral care   Frequent or constant Supervision/Assistance Dysphagia, oral phase (R13.11) (Cognitive decline at Baseline; Edentulous)     All goals met        Comer Portugal, MS, CCC-SLP Speech Language Pathologist Rehab Services; [facility] [place] [phone] (ascom) Mikylah Ackroyd  11/23/2023, 3:22 PM

## 2023-11-23 NOTE — Plan of Care (Signed)
  Problem: Education: Goal: Knowledge of disease or condition will improve Outcome: Progressing Goal: Knowledge of secondary prevention will improve (MUST DOCUMENT ALL) Outcome: Progressing Goal: Knowledge of patient specific risk factors will improve (DELETE if not current risk factor) Outcome: Progressing   Problem: Ischemic Stroke/TIA Tissue Perfusion: Goal: Complications of ischemic stroke/TIA will be minimized Outcome: Progressing   Problem: Coping: Goal: Will verbalize positive feelings about self Outcome: Progressing Goal: Will identify appropriate support needs Outcome: Progressing   Problem: Health Behavior/Discharge Planning: Goal: Ability to manage health-related needs will improve Outcome: Progressing Goal: Goals will be collaboratively established with patient/family Outcome: Progressing   Problem: Self-Care: Goal: Ability to participate in self-care as condition permits will improve Outcome: Progressing Goal: Verbalization of feelings and concerns over difficulty with self-care will improve Outcome: Progressing Goal: Ability to communicate needs accurately will improve Outcome: Progressing   Problem: Nutrition: Goal: Risk of aspiration will decrease Outcome: Progressing Goal: Dietary intake will improve Outcome: Progressing   Problem: Education: Goal: Ability to describe self-care measures that may prevent or decrease complications (Diabetes Survival Skills Education) will improve Outcome: Progressing   Problem: Coping: Goal: Ability to adjust to condition or change in health will improve Outcome: Progressing   Problem: Fluid Volume: Goal: Ability to maintain a balanced intake and output will improve Outcome: Progressing   Problem: Health Behavior/Discharge Planning: Goal: Ability to identify and utilize available resources and services will improve Outcome: Progressing Goal: Ability to manage health-related needs will improve Outcome: Progressing    Problem: Metabolic: Goal: Ability to maintain appropriate glucose levels will improve Outcome: Progressing   Problem: Nutritional: Goal: Maintenance of adequate nutrition will improve Outcome: Progressing Goal: Progress toward achieving an optimal weight will improve Outcome: Progressing   Problem: Skin Integrity: Goal: Risk for impaired skin integrity will decrease Outcome: Progressing   Problem: Tissue Perfusion: Goal: Adequacy of tissue perfusion will improve Outcome: Progressing   Problem: Education: Goal: Knowledge of General Education information will improve Description: Including pain rating scale, medication(s)/side effects and non-pharmacologic comfort measures Outcome: Progressing   Problem: Health Behavior/Discharge Planning: Goal: Ability to manage health-related needs will improve Outcome: Progressing   Problem: Clinical Measurements: Goal: Ability to maintain clinical measurements within normal limits will improve Outcome: Progressing Goal: Will remain free from infection Outcome: Progressing Goal: Diagnostic test results will improve Outcome: Progressing Goal: Respiratory complications will improve Outcome: Progressing Goal: Cardiovascular complication will be avoided Outcome: Progressing   Problem: Activity: Goal: Risk for activity intolerance will decrease Outcome: Progressing   Problem: Nutrition: Goal: Adequate nutrition will be maintained Outcome: Progressing   Problem: Coping: Goal: Level of anxiety will decrease Outcome: Progressing   Problem: Elimination: Goal: Will not experience complications related to bowel motility Outcome: Progressing Goal: Will not experience complications related to urinary retention Outcome: Progressing   Problem: Pain Managment: Goal: General experience of comfort will improve and/or be controlled Outcome: Progressing   Problem: Safety: Goal: Ability to remain free from injury will improve Outcome:  Progressing   Problem: Skin Integrity: Goal: Risk for impaired skin integrity will decrease Outcome: Progressing   Problem: Education: Goal: Knowledge of disease or condition will improve Outcome: Progressing Goal: Knowledge of secondary prevention will improve (MUST DOCUMENT ALL) Outcome: Progressing Goal: Knowledge of patient specific risk factors will improve (DELETE if not current risk factor) Outcome: Progressing

## 2023-11-24 DIAGNOSIS — R55 Syncope and collapse: Secondary | ICD-10-CM | POA: Diagnosis not present

## 2023-11-24 DIAGNOSIS — E871 Hypo-osmolality and hyponatremia: Secondary | ICD-10-CM

## 2023-11-24 DIAGNOSIS — Z7189 Other specified counseling: Secondary | ICD-10-CM | POA: Diagnosis not present

## 2023-11-24 DIAGNOSIS — I615 Nontraumatic intracerebral hemorrhage, intraventricular: Secondary | ICD-10-CM | POA: Diagnosis not present

## 2023-11-24 DIAGNOSIS — E1065 Type 1 diabetes mellitus with hyperglycemia: Secondary | ICD-10-CM | POA: Diagnosis not present

## 2023-11-24 DIAGNOSIS — W19XXXD Unspecified fall, subsequent encounter: Secondary | ICD-10-CM

## 2023-11-24 DIAGNOSIS — E440 Moderate protein-calorie malnutrition: Secondary | ICD-10-CM

## 2023-11-24 LAB — BASIC METABOLIC PANEL WITH GFR
Anion gap: 5 (ref 5–15)
BUN: 30 mg/dL — ABNORMAL HIGH (ref 8–23)
CO2: 29 mmol/L (ref 22–32)
Calcium: 8.3 mg/dL — ABNORMAL LOW (ref 8.9–10.3)
Chloride: 104 mmol/L (ref 98–111)
Creatinine, Ser: 1.13 mg/dL (ref 0.61–1.24)
GFR, Estimated: >60 mL/min (ref >60)
Glucose, Bld: 71 mg/dL (ref 70–99)
Potassium: 4.1 mmol/L (ref 3.5–5.1)
Sodium: 138 mmol/L (ref 135–145)

## 2023-11-24 LAB — GLUCOSE, CAPILLARY
Glucose-Capillary: 80 mg/dL (ref 70–99)
Glucose-Capillary: 189 mg/dL — ABNORMAL HIGH (ref 70–99)
Glucose-Capillary: 243 mg/dL — ABNORMAL HIGH (ref 70–99)
Glucose-Capillary: 337 mg/dL — ABNORMAL HIGH (ref 70–99)

## 2023-11-24 LAB — CBC
HCT: 25.7 % — ABNORMAL LOW (ref 39.0–52.0)
Hemoglobin: 8.4 g/dL — ABNORMAL LOW (ref 13.0–17.0)
MCH: 31.9 pg (ref 26.0–34.0)
MCHC: 32.7 g/dL (ref 30.0–36.0)
MCV: 97.7 fL (ref 80.0–100.0)
Platelets: 281 K/uL (ref 150–400)
RBC: 2.63 MIL/uL — ABNORMAL LOW (ref 4.22–5.81)
RDW: 16.7 % — ABNORMAL HIGH (ref 11.5–15.5)
WBC: 4.8 K/uL (ref 4.0–10.5)
nRBC: 0 % (ref 0.0–0.2)

## 2023-11-24 MED ORDER — FE FUM-VIT C-VIT B12-FA 460-60-0.01-1 MG PO CAPS
1.0000 | ORAL_CAPSULE | Freq: Two times a day (BID) | ORAL | Status: DC
  Administered 2023-11-24 – 2023-11-26 (×3): 1 via ORAL
  Filled 2023-11-24 (×4): qty 1

## 2023-11-24 NOTE — Progress Notes (Signed)
 Nutrition Follow-up  DOCUMENTATION CODES:   Non-severe (moderate) malnutrition in context of chronic illness  INTERVENTION:   -Continue with dysphagia 2 diet with thin liquids -D/c Glucerna Shake po TID, each supplement provides 220 kcal and 10 grams of protein  -Decrease Magic cup to BID with meals, each supplement provides 290 kcal and 9 grams of protein  -Continue MVI with minerals daily   NUTRITION DIAGNOSIS:   Moderate Malnutrition related to chronic illness as evidenced by mild fat depletion, moderate fat depletion, moderate muscle depletion, severe muscle depletion, percent weight loss.  Ongoing  GOAL:   Patient will meet greater than or equal to 90% of their needs  Progressing   MONITOR:   PO intake, Supplement acceptance, Diet advancement  REASON FOR ASSESSMENT:   Consult Assessment of nutrition requirement/status  ASSESSMENT:   Pt with medical history significant for type I DM, HLD, recent history of fall with subdural hematoma in June 2025, chronic orthostatic hypotension, A-fib on Eliquis , seizure disorder, mild cognitive impairment, EtOH use who was brought in from home after he lost consciousness, fell on the ground, has been having recurrent falls and confused at baseline.  8/19- s/p BSE- dysphagia 1 diet with thin liquids 8/25- s/p BSE- dysphagia 2 diet with thin liquids 8/26- s/p BSE- dysphagia 2 diet with thin liquids  Reviewed I/O's: -760 ml x 24 hours and +3.5 ml since admission  UOP: 1 L x 24 hours   RD re-consulted by SLP due to concern for supplements causing hyperglycemia.   Pt lying in bed, alert and interactive today. Pt very cheerful and states I am ready to have a great day. Pt reports good appetite and consuming most meals. Pt unsure if he is drinking supplements; noted half drank Ensure on countertop. Meal completions 20-100%.   No new wt since last visit.   Palliative care following; pt desires full scope care.   Medications  reviewed and include neurontin  and keppra .   Labs reviewed: CBGS: 80-343 (inpatient orders for glycemic control are 0-5 units insulin  aspart daily at bedtime, 0-6 units insulin  aspart TID with meals, 2 units insulin  aspart TID with meals, and 6 units insulin  glargine daily). Noted DM coordinator recommendations for 2 units insulin  aspart TID; noted improvement in CBGS since yesterday.   Diet Order:   Diet Order             DIET DYS 2 Room service appropriate? Yes with Assist; Fluid consistency: Thin  Diet effective now                   EDUCATION NEEDS:   Education needs have been addressed  Skin:  Skin Assessment: Reviewed RN Assessment  Last BM:  11/22/23  Height:   Ht Readings from Last 1 Encounters:  11/15/23 5' 10 (1.778 m)    Weight:   Wt Readings from Last 1 Encounters:  11/15/23 61.8 kg    Ideal Body Weight:  75.5 kg  BMI:  Body mass index is 19.55 kg/m.  Estimated Nutritional Needs:   Kcal:  1850-2050  Protein:  100-115 grams  Fluid:  1.8-2.0 L    Margery ORN, RD, LDN, CDCES Registered Dietitian III Certified Diabetes Care and Education Specialist If unable to reach this RD, please use RD Inpatient group chat on secure chat between hours of 8am-4 pm daily

## 2023-11-24 NOTE — Progress Notes (Signed)
 Progress Note   Patient: Nicholas Weiss FMW:[id number] DOB: [birth year] DOA: 11/15/2023     8 DOS: the patient was seen and examined on 11/24/2023   Brief hospital course: 73 y.o. male with medical history significant for type I DM, HLD, recent history of fall with subdural hematoma in June 2025, chronic orthostatic hypotension, A-fib on Eliquis , seizure disorder, mild cognitive impairment, EtOH use who was brought in from home after he lost consciousness, fell on the ground.  Patient's wife reported that he did not hit his head.  Patient was confused and not able to provide meaningful history.  He was also having  frequent falls.    Patient was recently admitted to the hospital between 11/11/2023 to 11/13/2023 after presenting a fall.  As noted above he had a similar incident with the fall in June 2025 and had a subdural hematoma and a fractured left upper extremity.  He also had insulin  pump malfunction and presented with nonketotic hyperosmolar state in August 2025.  Workup revealed intraventricular bleed involving right lateral ventricle and occipital horn of right ventricle on CT scan of the brain, seen by neurosurgery recommended conservative management.  Neurology was consulted, MRI negative for acute stroke, recommend holding anticoagulation long-term due to recurrent falls and history of SDH and now with intraventricular hemorrhage.  Patient was found to be lethargic on 08/20, with CT stable   Rapid response was called on 08/22 as patient was found unresponsive, CT head stable, improved back to baseline within an hour with no intervention. Code stroke was called 08/23, CT stroke stable Consulted Cardiology for management of Orthostatic hypotension, labile  Eliquis  was held and will be discontinued on discharge for now until cleared by neurosurgery.  PT recommending SNF-intermittent confusion and require 48 hours of sitter free before leaving.  Palliative care was also consulted but patient  remain full scope of care and full code.  8/27: Blood pressure elevated at 152/80, labs seem stable. Pending placement.  Assessment and Plan: * Intraventricular hemorrhage [facility]) Appreciate neurosurgical consultation recommended conservative management.  MRI does not show any acute stroke. CTA calcified atherosclerosis at carotid bifurcations approximately 50% stenosis of proximal cervical ICAs.  Severe stenosis right vertebral artery origin. -Repeat CT 08/20 unchanged focus of hemorrhage within body of right lateral ventricle, Layering hemorrhage within the occipital horns of both lateral ventricles, slightly increased (likely related to layering/ redistribution) - Holding Eliquis  at discharge. - Waxing and waning mental status with intermittent agitation -Encourage p.o. intake -Need to follow-up with outpatient neurology and neurosurgery with repeat CT in 2 weeks -PT is recommending SNF  Syncope and collapse, recurrent History of orthostatic hypotension.   Continue midodrine  with holding parameters if blood pressure greater than 120 for now.   Fall PT is recommending SNF -TOC is working on placement  Uncontrolled type 1 diabetes mellitus with hyperglycemia, with long-term current use of insulin  (HCC) Uses insulin  pump at home.   Continue Semglee  insulin  plus sliding scale.  Hyponatremia Resolved  Seizure disorder (HCC) Continue Keppra  and gabapentin   Anemia of chronic disease Some variation in hemoglobin, currently at 8.4 -Starting on supplement -Continue to monitor -Transfuse if below 7  Protein-calorie malnutrition, moderate (HCC) Estimated body mass index is 19.55 kg/m as calculated from the following:   Height as of this encounter: 5' 10 (1.778 m).   Weight as of this encounter: 61.8 kg.   -Dietitian consult  Atrial fibrillation, chronic (HCC) Stroke risk will be higher being off Eliquis  with his atrial fibrillation.  This is the second time back in the hospital  with a brain bleed.  Benefits and risks of anticoagulation was discontinued  Left supracondylar humerus fracture, sequela Seen on hospitalization 2 months ago - Continue shoulder sling for support as needed -Repeat imaging with increased displacement of comminuted proximal humerus fracture since June which may represent delayed fracture healing or refracture at the site of previous fracture. -Need outpatient follow-up with orthopedic surgery  MCI (mild cognitive impairment) with memory loss As needed risperidol for agitation. Seroquel  at night. - Discontinuing one-to-one sitter and see if he can be managed with telemetry sitter only - Palliative care was also consulted but patient would like to stay full code and full scope of care      Subjective: Patient was seen and examined today.  He was trying to get some sleep, no new concern.  No current agitation.  Physical Exam: Vitals:   11/24/23 0106 11/24/23 0601 11/24/23 0952 11/24/23 1400  BP: (!) 141/92 (!) 152/80 (!) 131/98 134/81  Pulse: 79 85 98 87  Resp: 14 12 18 18   Temp: (!) 97.5 F (36.4 C) 98 F (36.7 C) 98.8 F (37.1 C)   TempSrc: Oral Oral Oral   SpO2:  100% 99%   Weight:      Height:       General.  Frail elderly man, in no acute distress. Pulmonary.  Lungs clear bilaterally, normal respiratory effort. CV.  Regular rate and rhythm, no JVD, rub or murmur. Abdomen.  Soft, nontender, nondistended, BS positive. CNS.  Somnolent but arousable.  No focal neurologic deficit. Extremities.  No edema, no cyanosis, pulses intact and symmetrical.  Data Reviewed: Prior data reviewed  Family Communication:   Disposition: Status is: Inpatient Remains inpatient appropriate because: Severity of illness  Planned Discharge Destination: Skilled nursing facility  DVT prophylaxis.  SCDs Time spent: 50 minutes  This record has been created using Conservation officer, historic buildings. Errors have been sought and corrected,but may  not always be located. Such creation errors do not reflect on the standard of care.   Author: Amaryllis Dare, MD 11/24/2023 4:55 PM  For on call review [email or web address].

## 2023-11-24 NOTE — Assessment & Plan Note (Signed)
 Estimated body mass index is 19.55 kg/m as calculated from the following:   Height as of this encounter: 5' 10 (1.778 m).   Weight as of this encounter: 61.8 kg.   -Dietitian consult

## 2023-11-24 NOTE — Progress Notes (Signed)
 Physical Therapy Treatment Patient Details Name: Nicholas Weiss MRN: [id number] DOB: 15-May-1950 Today's Date: 11/24/2023   History of Present Illness 73 y.o. male present to hospitalafter he lost consciousness and fell on the ground.  Patient was recently admitted to the hospital between 11/11/2023 to 11/13/2023 after presenting a fall.  As noted above he had a similar incident with the fall in June 2025 and had a subdural hematoma and a fractured left upper extremity.    PT Comments  Patient supine in bed on arrival and agreeable to PT treatment session. Donned knee high TED hose and abdominal binder. Required modA for bed mobility and minA for sit to stand with HHAx1. Performed step pivot transfer with HHAx1 and modA. Orthostatics (+) despite knee high TED hose and abdominal binder, see below. Discharge plan remains appropriate.   Orthostatic BPs  Supine 124/79  Sitting 105/62  Sitting after 3 min 106/75  Standing 92/58  Standing after 3 min 65/44  Sitting  88/72  Sitting x 2 minutes  126/64     If plan is discharge home, recommend the following: A lot of help with walking and/or transfers;A lot of help with bathing/dressing/bathroom;Assistance with cooking/housework;Assist for transportation;Help with stairs or ramp for entrance   Can travel by private vehicle     No  Equipment Recommendations  None recommended by PT    Recommendations for Other Services       Precautions / Restrictions Precautions Precautions: Fall Recall of Precautions/Restrictions: Impaired Precaution/Restrictions Comments: Assumed NWB on LUE d/t L humerus fx-grimaces in pain to touch; orthostatic Restrictions Weight Bearing Restrictions Per Provider Order: No     Mobility  Bed Mobility Overal bed mobility: Needs Assistance Bed Mobility: Supine to Sit     Supine to sit: Mod assist, HOB elevated          Transfers Overall transfer level: Needs assistance Equipment used: 1 person hand held  assist Transfers: Sit to/from Stand, Bed to chair/wheelchair/BSC Sit to Stand: Min assist   Step pivot transfers: Mod assist       General transfer comment: complaining of dizziness in standing    Ambulation/Gait                   Stairs             Wheelchair Mobility     Tilt Bed    Modified Rankin (Stroke Patients Only)       Balance Overall balance assessment: Needs assistance Sitting-balance support: Feet supported, Single extremity supported Sitting balance-Leahy Scale: Good     Standing balance support: Bilateral upper extremity supported Standing balance-Leahy Scale: Poor                              Communication Communication Communication: Impaired Factors Affecting Communication: Hearing impaired  Cognition Arousal: Alert Behavior During Therapy: WFL for tasks assessed/performed   PT - Cognitive impairments: No family/caregiver present to determine baseline, History of cognitive impairments                       PT - Cognition Comments: initially lethargic but becomes more awake with stimulation Following commands: Impaired Following commands impaired: Follows one step commands with increased time    Cueing Cueing Techniques: Verbal cues, Tactile cues, Gestural cues  Exercises      General Comments        Pertinent Vitals/Pain Pain Assessment Pain Assessment: No/denies pain  Home Living                          Prior Function            PT Goals (current goals can now be found in the care plan section) Acute Rehab PT Goals PT Goal Formulation: Patient unable to participate in goal setting Time For Goal Achievement: 12/01/23 Potential to Achieve Goals: Fair Progress towards PT goals: Progressing toward goals    Frequency    Min 2X/week      PT Plan      Co-evaluation              AM-PAC PT 6 Clicks Mobility   Outcome Measure  Help needed turning from your back  to your side while in a flat bed without using bedrails?: A Little Help needed moving from lying on your back to sitting on the side of a flat bed without using bedrails?: A Little Help needed moving to and from a bed to a chair (including a wheelchair)?: A Lot Help needed standing up from a chair using your arms (e.g., wheelchair or bedside chair)?: A Lot Help needed to walk in hospital room?: A Lot Help needed climbing 3-5 steps with a railing? : Total 6 Click Score: 13    End of Session   Activity Tolerance: Treatment limited secondary to medical complications (Comment) Patient left: in chair;with call bell/phone within reach;with chair alarm set Nurse Communication: Mobility status PT Visit Diagnosis: Unsteadiness on feet (R26.81);Other abnormalities of gait and mobility (R26.89);Difficulty in walking, not elsewhere classified (R26.2);Repeated falls (R29.6);Muscle weakness (generalized) (M62.81);History of falling (Z91.81)     Time: 8944-8881 PT Time Calculation (min) (ACUTE ONLY): 23 min  Charges:    $Therapeutic Activity: 23-37 mins PT General Charges $$ ACUTE PT VISIT: 1 Visit                     Maryanne Finder, PT, DPT Physical Therapist - [facility]  [facility]    Shondrea Steinert A Fidel Caggiano 11/24/2023, 1:28 PM

## 2023-11-24 NOTE — Plan of Care (Signed)
 Problem: Education: Goal: Knowledge of disease or condition will improve Outcome: Progressing Goal: Knowledge of secondary prevention will improve (MUST DOCUMENT ALL) Outcome: Progressing Goal: Knowledge of patient specific risk factors will improve (DELETE if not current risk factor) Outcome: Progressing   Problem: Ischemic Stroke/TIA Tissue Perfusion: Goal: Complications of ischemic stroke/TIA will be minimized Outcome: Progressing   Problem: Coping: Goal: Will verbalize positive feelings about self Outcome: Progressing Goal: Will identify appropriate support needs Outcome: Progressing   Problem: Health Behavior/Discharge Planning: Goal: Ability to manage health-related needs will improve Outcome: Progressing Goal: Goals will be collaboratively established with patient/family Outcome: Progressing   Problem: Self-Care: Goal: Ability to participate in self-care as condition permits will improve Outcome: Progressing Goal: Verbalization of feelings and concerns over difficulty with self-care will improve Outcome: Progressing Goal: Ability to communicate needs accurately will improve Outcome: Progressing   Problem: Nutrition: Goal: Risk of aspiration will decrease Outcome: Progressing Goal: Dietary intake will improve Outcome: Progressing   Problem: Education: Goal: Ability to describe self-care measures that may prevent or decrease complications (Diabetes Survival Skills Education) will improve Outcome: Progressing   Problem: Coping: Goal: Ability to adjust to condition or change in health will improve Outcome: Progressing   Problem: Fluid Volume: Goal: Ability to maintain a balanced intake and output will improve Outcome: Progressing   Problem: Health Behavior/Discharge Planning: Goal: Ability to identify and utilize available resources and services will improve Outcome: Progressing Goal: Ability to manage health-related needs will improve Outcome: Progressing    Problem: Metabolic: Goal: Ability to maintain appropriate glucose levels will improve Outcome: Progressing   Problem: Nutritional: Goal: Maintenance of adequate nutrition will improve Outcome: Progressing Goal: Progress toward achieving an optimal weight will improve Outcome: Progressing   Problem: Skin Integrity: Goal: Risk for impaired skin integrity will decrease Outcome: Progressing   Problem: Tissue Perfusion: Goal: Adequacy of tissue perfusion will improve Outcome: Progressing   Problem: Education: Goal: Knowledge of General Education information will improve Description: Including pain rating scale, medication(s)/side effects and non-pharmacologic comfort measures Outcome: Progressing   Problem: Health Behavior/Discharge Planning: Goal: Ability to manage health-related needs will improve Outcome: Progressing   Problem: Clinical Measurements: Goal: Ability to maintain clinical measurements within normal limits will improve Outcome: Progressing Goal: Will remain free from infection Outcome: Progressing Goal: Diagnostic test results will improve Outcome: Progressing Goal: Respiratory complications will improve Outcome: Progressing Goal: Cardiovascular complication will be avoided Outcome: Progressing   Problem: Activity: Goal: Risk for activity intolerance will decrease Outcome: Progressing   Problem: Nutrition: Goal: Adequate nutrition will be maintained Outcome: Progressing   Problem: Coping: Goal: Level of anxiety will decrease Outcome: Progressing   Problem: Elimination: Goal: Will not experience complications related to bowel motility Outcome: Progressing Goal: Will not experience complications related to urinary retention Outcome: Progressing   Problem: Pain Managment: Goal: General experience of comfort will improve and/or be controlled Outcome: Progressing   Problem: Safety: Goal: Ability to remain free from injury will improve Outcome:  Progressing   Problem: Skin Integrity: Goal: Risk for impaired skin integrity will decrease Outcome: Progressing   Problem: Education: Goal: Knowledge of disease or condition will improve Outcome: Progressing Goal: Knowledge of secondary prevention will improve (MUST DOCUMENT ALL) Outcome: Progressing Goal: Knowledge of patient specific risk factors will improve (DELETE if not current risk factor) Outcome: Progressing   Problem: Intracerebral Hemorrhage Tissue Perfusion: Goal: Complications of Intracerebral Hemorrhage will be minimized Outcome: Progressing   Problem: Coping: Goal: Will verbalize positive feelings about self Outcome: Progressing Goal: Will  identify appropriate support needs Outcome: Progressing   Problem: Health Behavior/Discharge Planning: Goal: Ability to manage health-related needs will improve Outcome: Progressing Goal: Goals will be collaboratively established with patient/family Outcome: Progressing   Problem: Self-Care: Goal: Ability to participate in self-care as condition permits will improve Outcome: Progressing Goal: Verbalization of feelings and concerns over difficulty with self-care will improve Outcome: Progressing Goal: Ability to communicate needs accurately will improve Outcome: Progressing

## 2023-11-24 NOTE — Progress Notes (Signed)
 Daily Progress Note   Patient Name: Nicholas Weiss       Date: 11/24/2023 DOB: Aug 14, 1950  Age: 73 y.o. MRN#: [id number] Attending Physician: Caleen Qualia, MD Primary Care Physician: Alla Amis, MD Admit Date: 11/15/2023  Reason for Consultation/Follow-up: Establishing goals of care  Subjective: Notes and labs reviewed.  In to see patient to answer questions or assess for needs.  He is resting in bedside chair with tray table in front of him.  He is resting with eyes closed and states he is okay but does not answer further questions.  No family at bedside.  TeleSitter remains in place.  Continue full code and full scope.  Attempted to call son [name] unsuccessfully.  Length of Stay: 8  Current Medications: Scheduled Meds:   acyclovir   400 mg Oral Daily   atorvastatin   80 mg Oral QHS   gabapentin   300 mg Oral BID   insulin  aspart  0-5 Units Subcutaneous QHS   insulin  aspart  0-6 Units Subcutaneous TID WC   insulin  aspart  2 Units Subcutaneous TID WC   insulin  glargine  6 Units Subcutaneous Daily   latanoprost   1 drop Both Eyes QHS   levETIRAcetam   500 mg Oral BID   midodrine   2.5 mg Oral TID   multivitamin with minerals  1 tablet Oral Daily    Continuous Infusions:  lactated ringers  50 mL/hr at 11/24/23 1344    PRN Meds: acetaminophen  **OR** acetaminophen , labetalol , mouth rinse, oxyCODONE -acetaminophen , polyethylene glycol, risperiDONE   Physical Exam Constitutional:      Comments: Speaks with eyes closed  Pulmonary:     Effort: Pulmonary effort is normal.  Skin:    General: Skin is warm and dry.             Vital Signs: BP 134/81 (BP Location: Right Arm)   Pulse 87   Temp 98.8 F (37.1 C) (Oral)   Resp 18   Ht 5' 10 (1.778 m)   Wt 61.8 kg   SpO2 99%    BMI 19.55 kg/m  SpO2: SpO2: 99 % O2 Device: O2 Device: Room Air O2 Flow Rate:    Intake/output summary:  Intake/Output Summary (Last 24 hours) at 11/24/2023 1518 Last data filed at 11/24/2023 0900 Gross per 24 hour  Intake 240 ml  Output 1000 ml  Net -  760 ml   LBM: Last BM Date : 11/22/23 Baseline Weight: Weight: 63 kg Most recent weight: Weight: 61.8 kg         Patient Active Problem List   Diagnosis Date Noted   Protein-calorie malnutrition, moderate (HCC) 11/22/2023   Left supracondylar humerus fracture, sequela 11/16/2023   Atrial fibrillation, chronic (HCC) 11/16/2023   Drop in hemoglobin 09/16/2023   Left arm pain 09/15/2023   Fall 09/15/2023   Polysubstance abuse (HCC) 09/15/2023   Myocardial injury 09/15/2023   Subdural bleeding (HCC) 08/27/2023   SDH (subdural hematoma) (HCC) 08/27/2023   MCI (mild cognitive impairment) with memory loss 08/18/2023   DKA (diabetic ketoacidosis) (HCC) 08/15/2023   High anion gap metabolic acidosis 08/15/2023   Prolonged QT interval 07/22/2023   Uncontrolled type 1 diabetes mellitus with hypoglycemia, with long-term current use of insulin  (HCC) 04/05/2023   Hyperosmolar hyperglycemic state (HHS) (HCC) 03/26/2023   Iron  deficiency anemia 03/26/2023   Seizure disorder (HCC) 03/26/2023   Systemic inflammatory response syndrome (SIRS) associated with organ dysfunction (HCC) 03/26/2023   NSTEMI (non-ST elevated myocardial infarction) (HCC) 03/09/2023   Elevated troponin 03/08/2023   Paroxysmal atrial fibrillation (HCC) 03/08/2023   Chronic anticoagulation 03/08/2023   ICH (intracerebral hemorrhage) (HCC) 02/16/2023   Uncontrolled diabetes mellitus with hyperglycemia (HCC) 02/16/2023   GERD without esophagitis 02/16/2023   Diabetic neuropathy (HCC) 02/16/2023   Intraventricular hemorrhage (HCC) 02/16/2023   Myoclonus 01/15/2023   Hypothermia 12/29/2022   Dizziness 11/13/2022   Substance abuse (HCC) 09/18/2022   History of GI bleed  09/18/2022   History of intraventricular hemorrhage following syncopal event 02/15/2023 09/18/2022   Uncontrolled type 1 diabetes mellitus with hyperglycemia, with long-term current use of insulin  (HCC) 08/26/2022   Postural dizziness with presyncope 08/18/2022   Near syncope 08/18/2022   Closed fracture of shaft of left humerus 06/18/2022   Malnutrition of moderate degree 06/10/2022   Orthostatic hypotension 06/10/2022   GI bleed 06/06/2022   Subdural hematoma (HCC) 05/15/2022   Falls 05/15/2022   Shoulder pain, left 05/15/2022   History of amputation of left great toe (HCC) 05/04/2022   Hypophosphatemia 04/28/2022   Protein-calorie malnutrition, severe 04/24/2022   Diabetic ulcer of toe associated with diabetes mellitus due to underlying condition, with fat layer exposed (HCC) 04/24/2022   Streptococcal bacteremia 04/24/2022   Leukopenia 04/24/2022   History of multiple myeloma 04/24/2022   Dysautonomia orthostatic hypotension with recurrent syncope 04/24/2022   Weight loss 04/23/2022   Osteomyelitis of left foot (HCC) 04/22/2022   DKA, type 1 (HCC) 04/22/2022   Dehydration 04/08/2022   Diabetic ketoacidosis without coma associated with type 1 diabetes mellitus (HCC) 04/07/2022   Hyperkalemia 04/07/2022   Traumatic pneumothorax 02/18/2022   Multiple fractures of ribs, left side, initial encounter for closed fracture 02/18/2022   Pneumothorax, closed, traumatic 02/18/2022   Benign prostatic hyperplasia with nocturia 02/12/2022   Stroke (cerebrum) (HCC) 11/07/2021   Multiple myeloma (HCC) 09/29/2021   Multiple myeloma not having achieved remission (HCC) 09/29/2021   Hyponatremia 08/18/2021   COVID-19 virus infection 08/13/2021   History of CVA (cerebrovascular accident) 08/13/2021   Syncope and collapse, recurrent 08/09/2021   Hepatic lesion 08/05/2021   Dyslipidemia 05/08/2021   History of substance abuse (HCC) 12/09/2020   Acute metabolic encephalopathy 12/02/2020   Cocaine  abuse (HCC) 11/30/2020   Muscle twitching 09/12/2020   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   Pre-syncope 08/01/2020   Heart block AV first degree 08/01/2020   Anemia of chronic disease 05/28/2020  PUD (peptic ulcer disease)    Esophageal dysphagia    Encounter for screening colonoscopy    Hyperglycemia 07/28/2019   Generalized weakness 07/28/2019   Hypoglycemia 04/27/2019   Unresponsiveness 04/27/2019   Lung nodule 04/07/2019   Acute renal failure superimposed on stage 3a chronic kidney disease (HCC)    Hypertensive urgency 04/05/2019   AKI (acute kidney injury) (HCC) 04/05/2019   CKD (chronic kidney disease) stage 3, GFR 30-59 ml/min (HCC) 04/05/2019   Hyperlipidemia 01/24/2016   Type 1 diabetes mellitus with diabetic neuropathy, unspecified (HCC) 05/31/2006    Palliative Care Assessment & Plan   Recommendations/Plan: Continue full code/ full scope care  Code Status:    Code Status Orders  (From admission, onward)           Start     Ordered   11/15/23 0940  Full code  Continuous       Question:  By:  Answer:  Consent: discussion documented in EHR   11/15/23 0941           Code Status History     Date Active Date Inactive Code Status Order ID Comments User Context   11/11/2023 2108 11/13/2023 2058 Full Code [id number]  Franky Redia SAILOR, MD ED   09/15/2023 0041 09/18/2023 1716 Full Code [id number]  Hilma Rankins, MD ED   08/27/2023 0825 08/31/2023 2023 Full Code [id number]  Laurita Cort DASEN, MD ED   08/15/2023 1725 08/19/2023 2007 Full Code [id number]  Cox, Amy N, DO ED   07/21/2023 2101 07/29/2023 2214 Full Code [id number]  Cleatus Delayne GAILS, MD ED   04/06/2023 0104 04/11/2023 1946 Full Code [id number]  Tobie Mario GAILS, MD ED   03/26/2023 1650 03/31/2023 1753 Full Code [id number]  Caleen Qualia, MD ED   03/09/2023 0020 03/19/2023 2147 Full Code [id number]  Cleatus Delayne GAILS, MD ED   02/16/2023 0201 02/24/2023 2251 Full Code [id number]  Mansy, Madison LABOR, MD ED   12/29/2022 1210 12/30/2022 2055  Full Code [id number]  Kandis Devaughn Sayres, MD ED   11/13/2022 1930 11/15/2022 1804 Full Code [id number]  Arnett Saunders, MD ED   09/18/2022 2212 09/20/2022 2045 Full Code [id number]  Cleatus Delayne GAILS, MD ED   08/18/2022 2259 08/26/2022 1718 Full Code [id number]  Moody Alto, MD ED   07/13/2022 1648 07/16/2022 1739 Full Code [id number]  Lanetta Lingo, MD ED   07/13/2022 1646 07/13/2022 1648 Full Code [id number]  Lanetta Lingo, MD ED   07/09/2022 0743 07/11/2022 1642 Full Code [id number]  Eldonna Elspeth PARAS, MD ED   06/06/2022 1214 06/12/2022 2119 Full Code [id number]  Juvenal Harlene PENNER, DO ED   05/15/2022 0117 05/28/2022 2102 Full Code [id number]  Tobie Mario GAILS, MD ED   05/15/2022 0109 05/15/2022 0117 Full Code [id number]  Tobie Mario GAILS, MD ED   04/21/2022 0206 04/29/2022 2149 Full Code [id number]  Kathrene Almarie Bake, NP ED   04/07/2022 0449 04/09/2022 0015 Full Code [id number]  Cleatus Delayne GAILS, MD ED   03/09/2022 2351 03/12/2022 2244 Full Code [id number]  Tobie Mario GAILS, MD ED   02/18/2022 0134 02/25/2022 2050 Full Code [id number]  Laurence Locus, DO ED   02/18/2022 0047 02/18/2022 0134 Full Code [id number]  Laurence Locus, DO ED   12/07/2021 0047 12/09/2021 1843 Full Code [id number]  Kathrene Almarie Bake, NP ED   11/27/2021 0109 11/28/2021 2015 Full Code [id number]  Kathrene Almarie Bake, NP ED   08/09/2021 2113 08/11/2021 1742 Full Code [id number]  Tobie Mario GAILS, MD ED   12/06/2020  1712 12/07/2020 1953 Full Code [id number]  CoxGreig SAILOR, DO ED   11/28/2020 2235 12/04/2020 1508 Full Code [id number]  Jeryl Mardene RAMAN, DO ED   09/12/2020 0326 09/13/2020 1803 Full Code [id number]  Mansy, Madison LABOR, MD ED   08/01/2020 1537 08/02/2020 1553 Full Code [id number]  Cox, Amy N, DO ED   11/06/2019 1905 11/07/2019 1955 Full Code [id number]  Donnamarie Lebron PARAS, MD ED   07/28/2019 0306 07/29/2019 2343 Full Code [id number]  Cleatus Delayne GAILS, MD ED   04/27/2019 [phone] 04/28/2019 1739 Full Code [id number]  Mansy, Madison LABOR, MD ED   04/05/2019 1936 04/07/2019 1950 Full Code [id number]  Cleatus Delayne GAILS, MD ED   04/07/2016 1032 04/08/2016 1732 Full Code [id number]  Hugelmeyer, Alexis, DO Inpatient   04/07/2016 1032 04/07/2016 1032 Full Code [id number]  Hugelmeyer, Thersia, DO Inpatient       Thank you for allowing the Palliative Medicine Team to assist in the care of this patient.  Camelia Lewis, NP  Please contact Palliative Medicine Team phone at [phone] for questions and concerns.

## 2023-11-24 NOTE — Assessment & Plan Note (Signed)
 PT is recommending SNF -TOC is working on placement

## 2023-11-24 NOTE — Plan of Care (Signed)
 Problem: Education: Goal: Knowledge of disease or condition will improve 11/24/2023 1854 by Merilee Izetta SAUNDERS, LPN Outcome: Progressing 11/24/2023 1637 by Merilee Izetta SAUNDERS, LPN Outcome: Progressing Goal: Knowledge of secondary prevention will improve (MUST DOCUMENT ALL) 11/24/2023 1854 by Merilee Izetta SAUNDERS, LPN Outcome: Progressing 11/24/2023 1637 by Merilee, Jaxn Chiquito R, LPN Outcome: Progressing Goal: Knowledge of patient specific risk factors will improve (DELETE if not current risk factor) 11/24/2023 1854 by Merilee Izetta SAUNDERS, LPN Outcome: Progressing 11/24/2023 1637 by Merilee, Valory Wetherby R, LPN Outcome: Progressing   Problem: Ischemic Stroke/TIA Tissue Perfusion: Goal: Complications of ischemic stroke/TIA will be minimized 11/24/2023 1854 by Merilee Izetta SAUNDERS, LPN Outcome: Progressing 11/24/2023 1637 by Merilee, Breaker Springer R, LPN Outcome: Progressing   Problem: Coping: Goal: Will verbalize positive feelings about self 11/24/2023 1854 by Merilee Izetta SAUNDERS, LPN Outcome: Progressing 11/24/2023 1637 by Merilee, Rakhi Romagnoli R, LPN Outcome: Progressing Goal: Will identify appropriate support needs 11/24/2023 1854 by Merilee Izetta SAUNDERS, LPN Outcome: Progressing 11/24/2023 1637 by Merilee, Marajade Lei R, LPN Outcome: Progressing   Problem: Health Behavior/Discharge Planning: Goal: Ability to manage health-related needs will improve 11/24/2023 1854 by Merilee Izetta SAUNDERS, LPN Outcome: Progressing 11/24/2023 1637 by Merilee, Donyetta Ogletree R, LPN Outcome: Progressing Goal: Goals will be collaboratively established with patient/family 11/24/2023 1854 by Merilee Izetta R, LPN Outcome: Progressing 11/24/2023 1637 by Merilee, Virda Betters R, LPN Outcome: Progressing   Problem: Self-Care: Goal: Ability to participate in self-care as condition permits will improve 11/24/2023 1854 by Merilee, Mavi Un R, LPN Outcome: Progressing 11/24/2023 1637 by Merilee, Fenris Cauble R, LPN Outcome: Progressing Goal: Verbalization of feelings and  concerns over difficulty with self-care will improve 11/24/2023 1854 by Merilee, Priscille Shadduck R, LPN Outcome: Progressing 11/24/2023 1637 by Merilee, Elika Godar R, LPN Outcome: Progressing Goal: Ability to communicate needs accurately will improve 11/24/2023 1854 by Merilee, Tavaria Mackins R, LPN Outcome: Progressing 11/24/2023 1637 by Merilee, Davyon Fisch R, LPN Outcome: Progressing   Problem: Nutrition: Goal: Risk of aspiration will decrease 11/24/2023 1854 by Merilee Izetta SAUNDERS, LPN Outcome: Progressing 11/24/2023 1637 by Merilee, Reyann Troop R, LPN Outcome: Progressing Goal: Dietary intake will improve 11/24/2023 1854 by Merilee Izetta SAUNDERS, LPN Outcome: Progressing 11/24/2023 1637 by Merilee, Valera Vallas R, LPN Outcome: Progressing   Problem: Education: Goal: Ability to describe self-care measures that may prevent or decrease complications (Diabetes Survival Skills Education) will improve 11/24/2023 1854 by Merilee, Keshan Reha R, LPN Outcome: Progressing 11/24/2023 1637 by Merilee, Rastus Borton R, LPN Outcome: Progressing   Problem: Coping: Goal: Ability to adjust to condition or change in health will improve 11/24/2023 1854 by Merilee, Zakkiyya Barno R, LPN Outcome: Progressing 11/24/2023 1637 by Merilee, Marguerite Jarboe R, LPN Outcome: Progressing   Problem: Fluid Volume: Goal: Ability to maintain a balanced intake and output will improve 11/24/2023 1854 by Merilee Izetta SAUNDERS, LPN Outcome: Progressing 11/24/2023 1637 by Merilee, Latera Mclin R, LPN Outcome: Progressing   Problem: Health Behavior/Discharge Planning: Goal: Ability to identify and utilize available resources and services will improve 11/24/2023 1854 by Merilee, Daruis Swaim R, LPN Outcome: Progressing 11/24/2023 1637 by Merilee, Leandrea Ackley R, LPN Outcome: Progressing Goal: Ability to manage health-related needs will improve 11/24/2023 1854 by Merilee Izetta SAUNDERS, LPN Outcome: Progressing 11/24/2023 1637 by Merilee, Victoriano Campion R, LPN Outcome: Progressing   Problem: Metabolic: Goal: Ability to  maintain appropriate glucose levels will improve 11/24/2023 1854 by Merilee Izetta SAUNDERS, LPN Outcome: Progressing 11/24/2023 1637 by Merilee Izetta SAUNDERS, LPN Outcome: Progressing   Problem: Nutritional: Goal: Maintenance of adequate nutrition will improve 11/24/2023 1854 by Merilee Izetta SAUNDERS, LPN Outcome: Progressing 11/24/2023 1637 by Merilee,  Domingos Riggi R, LPN Outcome: Progressing Goal: Progress toward achieving an optimal weight will improve 11/24/2023 1854 by Merilee Izetta SAUNDERS, LPN Outcome: Progressing 11/24/2023 1637 by Merilee, Guiliana Shor R, LPN Outcome: Progressing   Problem: Skin Integrity: Goal: Risk for impaired skin integrity will decrease 11/24/2023 1854 by Merilee Izetta SAUNDERS, LPN Outcome: Progressing 11/24/2023 1637 by Merilee, Renelle Stegenga R, LPN Outcome: Progressing   Problem: Tissue Perfusion: Goal: Adequacy of tissue perfusion will improve 11/24/2023 1854 by Merilee Izetta SAUNDERS, LPN Outcome: Progressing 11/24/2023 1637 by Merilee Izetta SAUNDERS, LPN Outcome: Progressing   Problem: Education: Goal: Knowledge of General Education information will improve Description: Including pain rating scale, medication(s)/side effects and non-pharmacologic comfort measures 11/24/2023 1854 by Merilee Izetta SAUNDERS, LPN Outcome: Progressing 11/24/2023 1637 by Merilee, Trelyn Vanderlinde R, LPN Outcome: Progressing   Problem: Health Behavior/Discharge Planning: Goal: Ability to manage health-related needs will improve 11/24/2023 1854 by Merilee Izetta SAUNDERS, LPN Outcome: Progressing 11/24/2023 1637 by Merilee, Chevie Birkhead R, LPN Outcome: Progressing   Problem: Clinical Measurements: Goal: Ability to maintain clinical measurements within normal limits will improve 11/24/2023 1854 by Merilee, Dirk Vanaman R, LPN Outcome: Progressing 11/24/2023 1637 by Merilee, Sherlyne Crownover R, LPN Outcome: Progressing Goal: Will remain free from infection 11/24/2023 1854 by Merilee, Eudora Guevarra R, LPN Outcome: Progressing 11/24/2023 1637 by Merilee, Jaxten Brosh R,  LPN Outcome: Progressing Goal: Diagnostic test results will improve 11/24/2023 1854 by Merilee Izetta SAUNDERS, LPN Outcome: Progressing 11/24/2023 1637 by Merilee, Minami Arriaga R, LPN Outcome: Progressing Goal: Respiratory complications will improve 11/24/2023 1854 by Merilee Izetta SAUNDERS, LPN Outcome: Progressing 11/24/2023 1637 by Merilee, Harald Quevedo R, LPN Outcome: Progressing Goal: Cardiovascular complication will be avoided 11/24/2023 1854 by Merilee Izetta SAUNDERS, LPN Outcome: Progressing 11/24/2023 1637 by Merilee Izetta SAUNDERS, LPN Outcome: Progressing   Problem: Activity: Goal: Risk for activity intolerance will decrease 11/24/2023 1854 by Merilee Izetta SAUNDERS, LPN Outcome: Progressing 11/24/2023 1637 by Merilee Izetta SAUNDERS, LPN Outcome: Progressing   Problem: Nutrition: Goal: Adequate nutrition will be maintained 11/24/2023 1854 by Merilee Izetta SAUNDERS, LPN Outcome: Progressing 11/24/2023 1637 by Merilee, Genever Hentges R, LPN Outcome: Progressing   Problem: Coping: Goal: Level of anxiety will decrease 11/24/2023 1854 by Merilee Izetta SAUNDERS, LPN Outcome: Progressing 11/24/2023 1637 by Merilee, Zaydah Nawabi R, LPN Outcome: Progressing   Problem: Elimination: Goal: Will not experience complications related to bowel motility 11/24/2023 1854 by Merilee Izetta SAUNDERS, LPN Outcome: Progressing 11/24/2023 1637 by Merilee, Syriah Delisi R, LPN Outcome: Progressing Goal: Will not experience complications related to urinary retention 11/24/2023 1854 by Merilee Izetta SAUNDERS, LPN Outcome: Progressing 11/24/2023 1637 by Merilee, Loma Dubuque R, LPN Outcome: Progressing   Problem: Pain Managment: Goal: General experience of comfort will improve and/or be controlled 11/24/2023 1854 by Merilee Izetta SAUNDERS, LPN Outcome: Progressing 11/24/2023 1637 by Merilee, Jonathon Tan R, LPN Outcome: Progressing   Problem: Safety: Goal: Ability to remain free from injury will improve 11/24/2023 1854 by Merilee, Nahdia Doucet R, LPN Outcome: Progressing 11/24/2023 1637 by  Merilee, Wiletta Bermingham R, LPN Outcome: Progressing   Problem: Skin Integrity: Goal: Risk for impaired skin integrity will decrease 11/24/2023 1854 by Merilee Izetta SAUNDERS, LPN Outcome: Progressing 11/24/2023 1637 by Merilee, Gabriell Daigneault R, LPN Outcome: Progressing   Problem: Education: Goal: Knowledge of disease or condition will improve 11/24/2023 1854 by Merilee Izetta SAUNDERS, LPN Outcome: Progressing 11/24/2023 1637 by Merilee, Lyle Leisner R, LPN Outcome: Progressing Goal: Knowledge of secondary prevention will improve (MUST DOCUMENT ALL) 11/24/2023 1854 by Merilee Izetta SAUNDERS, LPN Outcome: Progressing 11/24/2023 1637 by Merilee Izetta SAUNDERS, LPN Outcome: Progressing Goal: Knowledge of patient specific risk factors  will improve (DELETE if not current risk factor) 11/24/2023 1854 by Merilee Izetta SAUNDERS, LPN Outcome: Progressing 11/24/2023 1637 by Merilee, Lianette Broussard R, LPN Outcome: Progressing   Problem: Intracerebral Hemorrhage Tissue Perfusion: Goal: Complications of Intracerebral Hemorrhage will be minimized 11/24/2023 1854 by Merilee Izetta SAUNDERS, LPN Outcome: Progressing 11/24/2023 1637 by Merilee Izetta SAUNDERS, LPN Outcome: Progressing   Problem: Coping: Goal: Will verbalize positive feelings about self 11/24/2023 1854 by Merilee Izetta SAUNDERS, LPN Outcome: Progressing 11/24/2023 1637 by Merilee Izetta SAUNDERS, LPN Outcome: Progressing Goal: Will identify appropriate support needs 11/24/2023 1854 by Merilee Izetta SAUNDERS, LPN Outcome: Progressing 11/24/2023 1637 by Merilee Izetta SAUNDERS, LPN Outcome: Progressing   Problem: Health Behavior/Discharge Planning: Goal: Ability to manage health-related needs will improve 11/24/2023 1854 by Merilee Izetta SAUNDERS, LPN Outcome: Progressing 11/24/2023 1637 by Merilee, Destane Speas R, LPN Outcome: Progressing Goal: Goals will be collaboratively established with patient/family 11/24/2023 1854 by Merilee Izetta SAUNDERS, LPN Outcome: Progressing 11/24/2023 1637 by Merilee, Rani Idler R, LPN Outcome:  Progressing   Problem: Self-Care: Goal: Ability to participate in self-care as condition permits will improve 11/24/2023 1854 by Merilee, Abeer Iversen R, LPN Outcome: Progressing 11/24/2023 1637 by Merilee, Allura Doepke R, LPN Outcome: Progressing Goal: Verbalization of feelings and concerns over difficulty with self-care will improve 11/24/2023 1854 by Merilee Izetta SAUNDERS, LPN Outcome: Progressing 11/24/2023 1637 by Merilee, Aiyla Baucom R, LPN Outcome: Progressing Goal: Ability to communicate needs accurately will improve 11/24/2023 1854 by Merilee Izetta SAUNDERS, LPN Outcome: Progressing 11/24/2023 1637 by Merilee Izetta SAUNDERS, LPN Outcome: Progressing

## 2023-11-24 NOTE — TOC Progression Note (Signed)
 Transition of Care [facility]) - Progression Note    Patient Details  Name: Nicholas Weiss MRN: [id number] Date of Birth: [birth year]  [facility] CM/SW Contact  Dalia GORMAN Fuse, RN Phone Number: 11/24/2023, 4:22 PM  Clinical Narrative:    WONDA outreached to [name] and lvmm advsing there are no bed offers in [place] TOC took the liberty to extend the bed search to [facility] and he has one bed offer, [facility]. TOC explained that if they do not wish to accept the bed, one alternative is home with services.                       Expected Discharge Plan and Services                                               Social Drivers of Health (SDOH) Interventions SDOH Screenings   Food Insecurity: No Food Insecurity (11/16/2023)  Housing: Unknown (11/16/2023)  Transportation Needs: No Transportation Needs (11/16/2023)  Utilities: Not At Risk (11/16/2023)  Alcohol Screen: Low Risk  (10/02/2020)  Depression (PHQ2-9): Low Risk  (11/10/2023)  Financial Resource Strain: Low Risk  (10/20/2023)   Received from [facility]  Physical Activity: Insufficiently Active (01/01/2020)  Social Connections: Moderately Isolated (11/16/2023)  Stress: No Stress Concern Present (01/01/2020)  Tobacco Use: Low Risk  (11/15/2023)  Recent Concern: Tobacco Use - Medium Risk (10/27/2023)   Received from [facility]    Readmission Risk Interventions    02/16/2023    3:56 PM 08/24/2022   10:57 AM 05/20/2022   11:41 AM  Readmission Risk Prevention Plan  Transportation Screening Complete Complete Complete  Medication Review Oceanographer) Complete Complete Complete  PCP or Specialist appointment within 3-5 days of discharge Complete Complete Complete  HRI or Home Care Consult Complete Complete Complete  SW Recovery Care/Counseling Consult Not Complete    Palliative Care Screening Not Applicable  Complete  Skilled Nursing Facility  Complete Complete Complete

## 2023-11-25 DIAGNOSIS — I615 Nontraumatic intracerebral hemorrhage, intraventricular: Secondary | ICD-10-CM | POA: Diagnosis not present

## 2023-11-25 DIAGNOSIS — R55 Syncope and collapse: Secondary | ICD-10-CM | POA: Diagnosis not present

## 2023-11-25 DIAGNOSIS — W19XXXD Unspecified fall, subsequent encounter: Secondary | ICD-10-CM | POA: Diagnosis not present

## 2023-11-25 DIAGNOSIS — E1065 Type 1 diabetes mellitus with hyperglycemia: Secondary | ICD-10-CM | POA: Diagnosis not present

## 2023-11-25 LAB — GLUCOSE, CAPILLARY
Glucose-Capillary: 412 mg/dL — ABNORMAL HIGH (ref 70–99)
Glucose-Capillary: 313 mg/dL — ABNORMAL HIGH (ref 70–99)
Glucose-Capillary: 201 mg/dL — ABNORMAL HIGH (ref 70–99)
Glucose-Capillary: 51 mg/dL — ABNORMAL LOW (ref 70–99)
Glucose-Capillary: 76 mg/dL (ref 70–99)
Glucose-Capillary: 47 mg/dL — ABNORMAL LOW (ref 70–99)
Glucose-Capillary: 99 mg/dL (ref 70–99)
Glucose-Capillary: 55 mg/dL — ABNORMAL LOW (ref 70–99)
Glucose-Capillary: 79 mg/dL (ref 70–99)

## 2023-11-25 LAB — BLOOD GAS, ARTERIAL
Acid-Base Excess: 1.3 mmol/L (ref 0.0–2.0)
Bicarbonate: 25.9 mmol/L (ref 20.0–28.0)
O2 Saturation: 98.6 %
Patient temperature: 37
pCO2 arterial: 40 mmHg (ref 32–48)
pH, Arterial: 7.42 (ref 7.35–7.45)
pO2, Arterial: 89 mmHg (ref 83–108)

## 2023-11-25 MED ORDER — DEXTROSE IN LACTATED RINGERS 5 % IV SOLN: INTRAVENOUS | Status: DC

## 2023-11-25 MED ORDER — DEXTROSE 50 % IV SOLN
25.0000 g | Freq: Once | INTRAVENOUS | Status: AC
  Administered 2023-11-25: 25 g via INTRAVENOUS
  Filled 2023-11-25: qty 50

## 2023-11-25 MED ORDER — INSULIN ASPART 100 UNIT/ML IJ SOLN
4.0000 [IU] | Freq: Once | INTRAMUSCULAR | Status: AC
  Administered 2023-11-25: 4 [IU] via SUBCUTANEOUS
  Filled 2023-11-25: qty 1

## 2023-11-25 MED ORDER — INSULIN GLARGINE 100 UNIT/ML ~~LOC~~ SOLN
8.0000 [IU] | Freq: Every day | SUBCUTANEOUS | Status: DC
  Administered 2023-11-25 – 2023-11-26 (×2): 8 [IU] via SUBCUTANEOUS
  Filled 2023-11-25 (×2): qty 0.08

## 2023-11-25 MED ORDER — INSULIN ASPART 100 UNIT/ML IJ SOLN
0.0000 [IU] | Freq: Three times a day (TID) | INTRAMUSCULAR | Status: DC
  Administered 2023-11-25: 7 [IU] via SUBCUTANEOUS
  Administered 2023-11-26: 2 [IU] via SUBCUTANEOUS
  Filled 2023-11-25 (×2): qty 1

## 2023-11-25 MED ORDER — INSULIN ASPART 100 UNIT/ML IJ SOLN
3.0000 [IU] | Freq: Three times a day (TID) | INTRAMUSCULAR | Status: DC
  Administered 2023-11-25 – 2023-11-26 (×2): 3 [IU] via SUBCUTANEOUS
  Filled 2023-11-25 (×2): qty 1

## 2023-11-25 NOTE — Progress Notes (Addendum)
 Progress Note   Patient: Nicholas Weiss FMW:[id number] DOB: May 20, 1950 DOA: 11/15/2023     9 DOS: the patient was seen and examined on 11/25/2023   Brief hospital course: 73 y.o. male with medical history significant for type I DM, HLD, recent history of fall with subdural hematoma in June 2025, chronic orthostatic hypotension, A-fib on Eliquis , seizure disorder, mild cognitive impairment, EtOH use who was brought in from home after he lost consciousness, fell on the ground.  Patient's wife reported that he did not hit his head.  Patient was confused and not able to provide meaningful history.  He was also having  frequent falls.    Patient was recently admitted to the hospital between 11/11/2023 to 11/13/2023 after presenting a fall.  As noted above he had a similar incident with the fall in June 2025 and had a subdural hematoma and a fractured left upper extremity.  He also had insulin  pump malfunction and presented with nonketotic hyperosmolar state in August 2025.  Workup revealed intraventricular bleed involving right lateral ventricle and occipital horn of right ventricle on CT scan of the brain, seen by neurosurgery recommended conservative management.  Neurology was consulted, MRI negative for acute stroke, recommend holding anticoagulation long-term due to recurrent falls and history of SDH and now with intraventricular hemorrhage.  Patient was found to be lethargic on 08/20, with CT stable   Rapid response was called on 08/22 as patient was found unresponsive, CT head stable, improved back to baseline within an hour with no intervention. Code stroke was called 08/23, CT stroke stable Consulted Cardiology for management of Orthostatic hypotension, labile  Eliquis  was held and will be discontinued on discharge for now until cleared by neurosurgery.  PT recommending SNF-intermittent confusion and require 48 hours of sitter free before leaving.  Palliative care was also consulted but patient  remain full scope of care and full code.  8/27: Blood pressure elevated at 152/80, labs seem stable. Pending placement.  8/28: Remained hemodynamically stable, with some increased somnolence, does not want to be bothered.  CBG elevated-change SSI to sensitive, mealtime coverage to 3 units and Lantus  to 8 units.  ABG also ordered. Consulted orthopedic at family request as they were concerned about more displaced shoulder fracture.  Message sent to Dr. Edie Now had a bed offer at [facility] authorization  Assessment and Plan: * Intraventricular hemorrhage [facility]) Appreciate neurosurgical consultation recommended conservative management.  MRI does not show any acute stroke. CTA calcified atherosclerosis at carotid bifurcations approximately 50% stenosis of proximal cervical ICAs.  Severe stenosis right vertebral artery origin. -Repeat CT 08/20 unchanged focus of hemorrhage within body of right lateral ventricle, Layering hemorrhage within the occipital horns of both lateral ventricles, slightly increased (likely related to layering/ redistribution) - Holding Eliquis  at discharge. - Waxing and waning mental status with intermittent agitation -Encourage p.o. intake -Need to follow-up with outpatient neurology and neurosurgery with repeat CT in 2 weeks -PT is recommending SNF  Syncope and collapse, recurrent History of orthostatic hypotension.   Continue midodrine  with holding parameters if blood pressure greater than 120 for now.   Fall PT is recommending SNF -TOC is working on placement  Uncontrolled type 1 diabetes mellitus with hyperglycemia, with long-term current use of insulin  (HCC) Uses insulin  pump at home.   CBG significantly elevated. - Increasing Lantus  to 8 units daily - Increase mealtime coverage to 3 units - Make SSI sensitive instead of renal   Hyponatremia Resolved  Seizure disorder (HCC) Continue  Keppra  and gabapentin   Anemia of chronic  disease Some variation in hemoglobin, currently at 8.4 -Starting on supplement -Continue to monitor -Transfuse if below 7  Protein-calorie malnutrition, moderate (HCC) Estimated body mass index is 19.55 kg/m as calculated from the following:   Height as of this encounter: 5' 10 (1.778 m).   Weight as of this encounter: 61.8 kg.   -Dietitian consult  Atrial fibrillation, chronic (HCC) Stroke risk will be higher being off Eliquis  with his atrial fibrillation.  This is the second time back in the hospital with a brain bleed.  Benefits and risks of anticoagulation was discontinued  Left supracondylar humerus fracture, sequela Seen on hospitalization 2 months ago - Continue shoulder sling for support as needed -Repeat imaging with increased displacement of comminuted proximal humerus fracture since June which may represent delayed fracture healing or refracture at the site of previous fracture. -Message sent to Dr.Poggi at family's request  MCI (mild cognitive impairment) with memory loss As needed risperidol for agitation. Seroquel  at night. - Discontinuing one-to-one sitter and see if he can be managed with telemetry sitter only - Palliative care was also consulted but patient would like to stay full code and full scope of care      Subjective: Patient was sleeping when seen today.  Refused to talk and looks little annoyed when I tried to wake him up.  Physical Exam: Vitals:   11/25/23 0553 11/25/23 0821 11/25/23 1150 11/25/23 1358  BP: (!) 140/87 (!) 150/93 105/67 96/61  Pulse: (!) 102 (!) 106 (!) 52 81  Resp: 14 18 16 16   Temp: 97.9 F (36.6 C) 98.1 F (36.7 C) 98.3 F (36.8 C) 97.8 F (36.6 C)  TempSrc:   Oral Oral  SpO2: 100% 100%  98%  Weight:      Height:       General.  Frail elderly man, in no acute distress. Pulmonary.  Lungs clear bilaterally, normal respiratory effort. CV.  Regular rate and rhythm, no JVD, rub or murmur. Abdomen.  Soft, nontender,  nondistended, BS positive. CNS.  Alert and oriented .  No focal neurologic deficit. Extremities.  No edema, no cyanosis, pulses intact and symmetrical.   Data Reviewed: Prior data reviewed  Family Communication: Called both contacts listed in his chart, a friend and son without any response.  Disposition: Status is: Inpatient Remains inpatient appropriate because: Severity of illness  Planned Discharge Destination: Skilled nursing facility  DVT prophylaxis.  SCDs Time spent: 50 minutes  This record has been created using Conservation officer, historic buildings. Errors have been sought and corrected,but may not always be located. Such creation errors do not reflect on the standard of care.   Author: Amaryllis Dare, MD 11/25/2023 2:48 PM  For on call review [email or web address].

## 2023-11-25 NOTE — Assessment & Plan Note (Signed)
 Appreciate neurosurgical consultation recommended conservative management.  MRI does not show any acute stroke. CTA calcified atherosclerosis at carotid bifurcations approximately 50% stenosis of proximal cervical ICAs.  Severe stenosis right vertebral artery origin. -Repeat CT 08/20 unchanged focus of hemorrhage within body of right lateral ventricle, Layering hemorrhage within the occipital horns of both lateral ventricles, slightly increased (likely related to layering/ redistribution) - Holding Eliquis  at discharge. - Waxing and waning mental status with intermittent agitation -Encourage p.o. intake -Need to follow-up with outpatient neurology and neurosurgery with repeat CT in 2 weeks -PT is recommending SNF

## 2023-11-25 NOTE — TOC Progression Note (Addendum)
 Transition of Care [facility]) - Progression Note    Patient Details  Name: NAMEER SUMMER MRN: [id number] Date of Birth: 10-19-1950  Transition of Care [facility]) CM/SW Contact  Dalia GORMAN Fuse, RN Phone Number: 11/25/2023, 12:19 PM  Clinical Narrative:    WONDA outreached to [name] and shared there are no bed offers in [employer] He has one bed offer, [facility]. She is going to visit the facility today and will f/u with TOC. TOC explained that if they do not wish to accept the bed, one alternative is home with services.   TOC received a call from [facility], they would like to accept the bed at [facility]. She also wants to ensure MD team is aware she has concerns with the patients arm. TOC sent message to Hospitalist. TOC called to Shakira/Kia [phone] in admissions to accept bed offer Nitchia will start ins auth.                     Expected Discharge Plan and Services                                               Social Drivers of Health (SDOH) Interventions SDOH Screenings   Food Insecurity: No Food Insecurity (11/16/2023)  Housing: Unknown (11/16/2023)  Transportation Needs: No Transportation Needs (11/16/2023)  Utilities: Not At Risk (11/16/2023)  Alcohol Screen: Low Risk  (10/02/2020)  Depression (PHQ2-9): Low Risk  (11/10/2023)  Financial Resource Strain: Low Risk  (10/20/2023)   Received from [facility]  Physical Activity: Insufficiently Active (01/01/2020)  Social Connections: Moderately Isolated (11/16/2023)  Stress: No Stress Concern Present (01/01/2020)  Tobacco Use: Low Risk  (11/15/2023)  Recent Concern: Tobacco Use - Medium Risk (10/27/2023)   Received from [facility]    Readmission Risk Interventions    02/16/2023    3:56 PM 08/24/2022   10:57 AM 05/20/2022   11:41 AM  Readmission Risk Prevention Plan  Transportation Screening Complete Complete Complete  Medication Review Oceanographer) Complete  Complete Complete  PCP or Specialist appointment within 3-5 days of discharge Complete Complete Complete  HRI or Home Care Consult Complete Complete Complete  SW Recovery Care/Counseling Consult Not Complete    Palliative Care Screening Not Applicable  Complete  Skilled Nursing Facility Complete Complete Complete

## 2023-11-25 NOTE — Assessment & Plan Note (Signed)
 Seen on hospitalization 2 months ago - Continue shoulder sling for support as needed -Repeat imaging with increased displacement of comminuted proximal humerus fracture since June which may represent delayed fracture healing or refracture at the site of previous fracture. -Message sent to Dr.Poggi at family's request

## 2023-11-25 NOTE — Progress Notes (Signed)
 Daily Progress Note   Patient Name: Nicholas Weiss       Date: 11/25/2023 DOB: 1950-04-16  Age: 73 y.o. MRN#: [id number] Attending Physician: Caleen Qualia, MD Primary Care Physician: Alla Amis, MD Admit Date: 11/15/2023  Reason for Consultation/Follow-up: Establishing goals of care  Subjective: Notes and labs reviewed.  Working towards rehab.  Patient with no complaints at this time.  Called to speak with son [name] who is listed in demographics.  He states his family makes decisions together and to speak with [name] regarding decisions.  He confirms as far as he is aware his father would want full code and full scope care.  Length of Stay: 9  Current Medications: Scheduled Meds:   acyclovir   400 mg Oral Daily   atorvastatin   80 mg Oral QHS   Fe Fum-Vit C-Vit B12-FA  1 capsule Oral BID   gabapentin   300 mg Oral BID   insulin  aspart  0-5 Units Subcutaneous QHS   insulin  aspart  0-9 Units Subcutaneous TID WC   insulin  aspart  3 Units Subcutaneous TID WC   insulin  glargine  8 Units Subcutaneous Daily   latanoprost   1 drop Both Eyes QHS   levETIRAcetam   500 mg Oral BID   midodrine   2.5 mg Oral TID   multivitamin with minerals  1 tablet Oral Daily    Continuous Infusions:   PRN Meds: acetaminophen  **OR** acetaminophen , labetalol , mouth rinse, oxyCODONE -acetaminophen , polyethylene glycol, risperiDONE   Physical Exam Pulmonary:     Effort: Pulmonary effort is normal.  Neurological:     Mental Status: He is alert.             Vital Signs: BP 96/61 (BP Location: Right Arm)   Pulse 81   Temp 97.8 F (36.6 C) (Oral)   Resp 16   Ht 5' 10 (1.778 m)   Wt 61.8 kg   SpO2 98%   BMI 19.55 kg/m  SpO2: SpO2: 98 % O2 Device: O2 Device: Room Air O2 Flow Rate:     Intake/output summary:  Intake/Output Summary (Last 24 hours) at 11/25/2023 1442 Last data filed at 11/25/2023 9385 Gross per 24 hour  Intake --  Output 750 ml  Net -750 ml   LBM: Last BM Date : 11/22/23 Baseline Weight: Weight: 63 kg Most recent weight: Weight: 61.8 kg  Patient Active Problem List   Diagnosis Date Noted   Protein-calorie malnutrition, moderate (HCC) 11/22/2023   Left supracondylar humerus fracture, sequela 11/16/2023   Atrial fibrillation, chronic (HCC) 11/16/2023   Drop in hemoglobin 09/16/2023   Left arm pain 09/15/2023   Fall 09/15/2023   Polysubstance abuse (HCC) 09/15/2023   Myocardial injury 09/15/2023   Subdural bleeding (HCC) 08/27/2023   SDH (subdural hematoma) (HCC) 08/27/2023   MCI (mild cognitive impairment) with memory loss 08/18/2023   DKA (diabetic ketoacidosis) (HCC) 08/15/2023   High anion gap metabolic acidosis 08/15/2023   Prolonged QT interval 07/22/2023   Uncontrolled type 1 diabetes mellitus with hypoglycemia, with long-term current use of insulin  (HCC) 04/05/2023   Hyperosmolar hyperglycemic state (HHS) (HCC) 03/26/2023   Iron  deficiency anemia 03/26/2023   Seizure disorder (HCC) 03/26/2023   Systemic inflammatory response syndrome (SIRS) associated with organ dysfunction (HCC) 03/26/2023   NSTEMI (non-ST elevated myocardial infarction) (HCC) 03/09/2023   Elevated troponin 03/08/2023   Paroxysmal atrial fibrillation (HCC) 03/08/2023   Chronic anticoagulation 03/08/2023   ICH (intracerebral hemorrhage) (HCC) 02/16/2023   Uncontrolled diabetes mellitus with hyperglycemia (HCC) 02/16/2023   GERD without esophagitis 02/16/2023   Diabetic neuropathy (HCC) 02/16/2023   Intraventricular hemorrhage (HCC) 02/16/2023   Myoclonus 01/15/2023   Hypothermia 12/29/2022   Dizziness 11/13/2022   Substance abuse (HCC) 09/18/2022   History of GI bleed 09/18/2022   History of intraventricular hemorrhage following syncopal event 02/15/2023  09/18/2022   Uncontrolled type 1 diabetes mellitus with hyperglycemia, with long-term current use of insulin  (HCC) 08/26/2022   Postural dizziness with presyncope 08/18/2022   Near syncope 08/18/2022   Closed fracture of shaft of left humerus 06/18/2022   Malnutrition of moderate degree 06/10/2022   Orthostatic hypotension 06/10/2022   GI bleed 06/06/2022   Subdural hematoma (HCC) 05/15/2022   Falls 05/15/2022   Shoulder pain, left 05/15/2022   History of amputation of left great toe (HCC) 05/04/2022   Hypophosphatemia 04/28/2022   Protein-calorie malnutrition, severe 04/24/2022   Diabetic ulcer of toe associated with diabetes mellitus due to underlying condition, with fat layer exposed (HCC) 04/24/2022   Streptococcal bacteremia 04/24/2022   Leukopenia 04/24/2022   History of multiple myeloma 04/24/2022   Dysautonomia orthostatic hypotension with recurrent syncope 04/24/2022   Weight loss 04/23/2022   Osteomyelitis of left foot (HCC) 04/22/2022   DKA, type 1 (HCC) 04/22/2022   Dehydration 04/08/2022   Diabetic ketoacidosis without coma associated with type 1 diabetes mellitus (HCC) 04/07/2022   Hyperkalemia 04/07/2022   Traumatic pneumothorax 02/18/2022   Multiple fractures of ribs, left side, initial encounter for closed fracture 02/18/2022   Pneumothorax, closed, traumatic 02/18/2022   Benign prostatic hyperplasia with nocturia 02/12/2022   Stroke (cerebrum) (HCC) 11/07/2021   Multiple myeloma (HCC) 09/29/2021   Multiple myeloma not having achieved remission (HCC) 09/29/2021   Hyponatremia 08/18/2021   COVID-19 virus infection 08/13/2021   History of CVA (cerebrovascular accident) 08/13/2021   Syncope and collapse, recurrent 08/09/2021   Hepatic lesion 08/05/2021   Dyslipidemia 05/08/2021   History of substance abuse (HCC) 12/09/2020   Acute metabolic encephalopathy 12/02/2020   Cocaine abuse (HCC) 11/30/2020   Muscle twitching 09/12/2020   Smoldering multiple myeloma  08/02/2020   Pre-syncope 08/01/2020   Heart block AV first degree 08/01/2020   Anemia of chronic disease 05/28/2020   PUD (peptic ulcer disease)    Esophageal dysphagia    Encounter for screening colonoscopy    Hyperglycemia 07/28/2019   Generalized weakness 07/28/2019   Hypoglycemia  04/27/2019   Unresponsiveness 04/27/2019   Lung nodule 04/07/2019   Acute renal failure superimposed on stage 3a chronic kidney disease (HCC)    Hypertensive urgency 04/05/2019   AKI (acute kidney injury) (HCC) 04/05/2019   CKD (chronic kidney disease) stage 3, GFR 30-59 ml/min (HCC) 04/05/2019   Hyperlipidemia 01/24/2016   Type 1 diabetes mellitus with diabetic neuropathy, unspecified (HCC) 05/31/2006    Palliative Care Assessment & Plan   Recommendations/Plan:  TOC working on discharge placement. Continue full code and full scope care. As goals are set PMT will sign off at this time.  Please reconsult if needs arise.  Code Status:    Code Status Orders  (From admission, onward)           Start     Ordered   11/15/23 0940  Full code  Continuous       Question:  By:  Answer:  Consent: discussion documented in EHR   11/15/23 0941           Code Status History     Date Active Date Inactive Code Status Order ID Comments User Context   11/11/2023 2108 11/13/2023 2058 Full Code [id number]  Franky Redia SAILOR, MD ED   09/15/2023 0041 09/18/2023 1716 Full Code [id number]  Hilma Rankins, MD ED   08/27/2023 0825 08/31/2023 2023 Full Code [id number]  Laurita Cort DASEN, MD ED   08/15/2023 1725 08/19/2023 2007 Full Code [id number]  Cox, Amy N, DO ED   07/21/2023 2101 07/29/2023 2214 Full Code [id number]  Cleatus Delayne GAILS, MD ED   04/06/2023 0104 04/11/2023 1946 Full Code [id number]  Tobie Mario GAILS, MD ED   03/26/2023 1650 03/31/2023 1753 Full Code [id number]  Caleen Qualia, MD ED   03/09/2023 0020 03/19/2023 2147 Full Code [id number]  Cleatus Delayne GAILS, MD ED   02/16/2023 0201 02/24/2023 2251 Full Code [id number]  Mansy, Madison LABOR, MD ED   12/29/2022 1210 12/30/2022 2055 Full Code [id number]  Kandis Devaughn Sayres, MD ED   11/13/2022 1930 11/15/2022 1804 Full Code [id number]  Arnett Saunders, MD ED   09/18/2022 2212 09/20/2022 2045 Full Code [id number]  Cleatus Delayne GAILS, MD ED   08/18/2022 2259 08/26/2022 1718 Full Code [id number]  Moody Alto, MD ED   07/13/2022 1648 07/16/2022 1739 Full Code [id number]  Lanetta Lingo, MD ED   07/13/2022 1646 07/13/2022 1648 Full Code [id number]  Lanetta Lingo, MD ED   07/09/2022 0743 07/11/2022 1642 Full Code [id number]  Eldonna Elspeth PARAS, MD ED   06/06/2022 1214 06/12/2022 2119 Full Code [id number]  Juvenal Harlene PENNER, DO ED   05/15/2022 0117 05/28/2022 2102 Full Code [id number]  Tobie Mario GAILS, MD ED   05/15/2022 0109 05/15/2022 0117 Full Code [id number]  Tobie Mario GAILS, MD ED   04/21/2022 0206 04/29/2022 2149 Full Code [id number]  Kathrene Almarie Bake, NP ED   04/07/2022 0449 04/09/2022 0015 Full Code [id number]  Cleatus Delayne GAILS, MD ED   03/09/2022 2351 03/12/2022 2244 Full Code [id number]  Tobie Mario GAILS, MD ED   02/18/2022 0134 02/25/2022 2050 Full Code [id number]  Laurence Locus, DO ED   02/18/2022 0047 02/18/2022 0134 Full Code [id number]  Laurence Locus, DO ED   12/07/2021 0047 12/09/2021 1843 Full Code [id number]  Kathrene Almarie Bake, NP ED   11/27/2021 0109 11/28/2021 2015 Full Code [id number]  Kathrene Almarie Bake, NP ED   08/09/2021 2113 08/11/2021 1742 Full Code [id number]  Tobie Mario GAILS, MD ED   12/06/2020 1712 12/07/2020 1953 Full Code  [id number]  Cox, Amy N, DO ED   11/28/2020 2235 12/04/2020 1508 Full Code [id number]  Jeryl Mardene RAMAN, DO ED   09/12/2020 0326 09/13/2020 1803 Full Code [id number]  Mansy, Madison LABOR, MD ED   08/01/2020 1537 08/02/2020 1553 Full Code [id number]  Cox, Amy N, DO ED   11/06/2019 1905 11/07/2019 1955 Full Code [id number]  Donnamarie Lebron PARAS, MD ED   07/28/2019 0306 07/29/2019 2343 Full Code [id number]  Cleatus Delayne GAILS, MD ED   04/27/2019 [phone] 04/28/2019 1739 Full Code [id number]  Mansy, Madison LABOR, MD ED   04/05/2019 1936  04/07/2019 1950 Full Code [id number]  Cleatus Delayne GAILS, MD ED   04/07/2016 1032 04/08/2016 1732 Full Code [id number]  Florie Rouse, DO Inpatient   04/07/2016 1032 04/07/2016 1032 Full Code [id number]  Hugelmeyer, Rouse, DO Inpatient       Camelia Lewis, NP  Please contact Palliative Medicine Team phone at [phone] for questions and concerns.

## 2023-11-25 NOTE — Progress Notes (Signed)
 OT Cancellation Note  Patient Details Name: Nicholas Weiss MRN: [id number] DOB: [birth year]   Cancelled Treatment:    Reason Eval/Treat Not Completed: Other (comment). OT attempt, pt is sleeping hard on entry, attempted to arouse via verbal stimuli, sternal rub, pain responses and pt continued to snore. Nurse aware of pt status. L hand swollen, elevated on rolled blanket. Will follow up as appropriate.  Mason Burleigh E Nollan Muldrow 11/25/2023, 2:55 PM

## 2023-11-25 NOTE — Assessment & Plan Note (Signed)
 Uses insulin  pump at home.   CBG significantly elevated. - Increasing Lantus  to 8 units daily - Increase mealtime coverage to 3 units - Make SSI sensitive instead of renal

## 2023-11-26 ENCOUNTER — Encounter: Payer: Self-pay | Admitting: Family

## 2023-11-26 DIAGNOSIS — E871 Hypo-osmolality and hyponatremia: Secondary | ICD-10-CM | POA: Diagnosis not present

## 2023-11-26 DIAGNOSIS — W19XXXD Unspecified fall, subsequent encounter: Secondary | ICD-10-CM | POA: Diagnosis not present

## 2023-11-26 DIAGNOSIS — E1065 Type 1 diabetes mellitus with hyperglycemia: Secondary | ICD-10-CM | POA: Diagnosis not present

## 2023-11-26 DIAGNOSIS — G3184 Mild cognitive impairment, so stated: Secondary | ICD-10-CM

## 2023-11-26 DIAGNOSIS — R55 Syncope and collapse: Secondary | ICD-10-CM | POA: Diagnosis not present

## 2023-11-26 LAB — GLUCOSE, CAPILLARY
Glucose-Capillary: 107 mg/dL — ABNORMAL HIGH (ref 70–99)
Glucose-Capillary: 194 mg/dL — ABNORMAL HIGH (ref 70–99)
Glucose-Capillary: 337 mg/dL — ABNORMAL HIGH (ref 70–99)
Glucose-Capillary: 51 mg/dL — ABNORMAL LOW (ref 70–99)
Glucose-Capillary: 55 mg/dL — ABNORMAL LOW (ref 70–99)
Glucose-Capillary: 83 mg/dL (ref 70–99)
Glucose-Capillary: 86 mg/dL (ref 70–99)

## 2023-11-26 LAB — BASIC METABOLIC PANEL WITH GFR
Anion gap: 5 (ref 5–15)
BUN: 26 mg/dL — ABNORMAL HIGH (ref 8–23)
CO2: 28 mmol/L (ref 22–32)
Calcium: 8.4 mg/dL — ABNORMAL LOW (ref 8.9–10.3)
Chloride: 105 mmol/L (ref 98–111)
Creatinine, Ser: 1.13 mg/dL (ref 0.61–1.24)
GFR, Estimated: >60 mL/min (ref >60)
Glucose, Bld: 57 mg/dL — ABNORMAL LOW (ref 70–99)
Potassium: 4.8 mmol/L (ref 3.5–5.1)
Sodium: 138 mmol/L (ref 135–145)

## 2023-11-26 LAB — CBC
HCT: 27.1 % — ABNORMAL LOW (ref 39.0–52.0)
Hemoglobin: 8.7 g/dL — ABNORMAL LOW (ref 13.0–17.0)
MCH: 31.6 pg (ref 26.0–34.0)
MCHC: 32.1 g/dL (ref 30.0–36.0)
MCV: 98.5 fL (ref 80.0–100.0)
Platelets: 291 K/uL (ref 150–400)
RBC: 2.75 MIL/uL — ABNORMAL LOW (ref 4.22–5.81)
RDW: 16.8 % — ABNORMAL HIGH (ref 11.5–15.5)
WBC: 5.5 K/uL (ref 4.0–10.5)
nRBC: 0 % (ref 0.0–0.2)

## 2023-11-26 MED ORDER — POLYETHYLENE GLYCOL 3350 17 G PO PACK: 17.0000 g | PACK | Freq: Every day | ORAL | Status: DC | PRN

## 2023-11-26 MED ORDER — RISPERIDONE 0.5 MG PO TABS: 0.5000 mg | ORAL_TABLET | Freq: Three times a day (TID) | ORAL | Status: DC | PRN

## 2023-11-26 MED ORDER — DEXCOM G7 SENSOR MISC: 1.0000 | Freq: Three times a day (TID) | Status: DC

## 2023-11-26 MED ORDER — MIDODRINE HCL 2.5 MG PO TABS: 2.5000 mg | ORAL_TABLET | Freq: Three times a day (TID) | ORAL | Status: DC | PRN

## 2023-11-26 MED ORDER — FE FUM-VIT C-VIT B12-FA 460-60-0.01-1 MG PO CAPS: 1.0000 | ORAL_CAPSULE | Freq: Two times a day (BID) | ORAL | Status: AC

## 2023-11-26 MED ORDER — OXYCODONE-ACETAMINOPHEN 5-325 MG PO TABS: 1.0000 | ORAL_TABLET | Freq: Four times a day (QID) | ORAL | 0 refills | Status: DC | PRN

## 2023-11-26 MED ORDER — ADULT MULTIVITAMIN W/MINERALS CH: 1.0000 | ORAL_TABLET | Freq: Every day | ORAL | Status: AC

## 2023-11-26 NOTE — Progress Notes (Signed)
Erroneous encounter / entered in error

## 2023-11-26 NOTE — Consult Note (Signed)
 ORTHOPAEDIC CONSULTATION  REQUESTING PHYSICIAN: Caleen Qualia, MD  Chief Complaint:   Left shoulder pain  History of Present Illness: Nicholas Weiss is a 73 y.o. male with multiple medical problems including diabetes, metabolic encephalopathy, hypercholesterolemia, and hypertension who apparently fell 2 months ago and injured his left proximal humerus.  Initial x-rays in the emergency room demonstrated satisfactory alignment so the patient was placed into a shoulder immobilizer and advised to follow-up with orthopedics.  He was seen 2 weeks later by my PA, Gustavo Level, PA-C.  He advised the patient to continue to wear his shoulder immobilizer at all times, removing it only for bathing purposes.  The patient followed up 3.5 weeks later where x-rays demonstrated early healing of the fracture.  According to the patient's family, he had not been wearing his shoulder immobilizer on a regular basis.  The patient was given a new shoulder immobilizer and advised to continue to wear her shoulder immobilizer for an additional 3 weeks before beginning home physical therapy.  Apparently, the patient has fallen several times since this last visit, most recently 11 days ago for which he was readmitted for further workup.  Concern was raised for an intracranial hemorrhage which is being followed by neurosurgery.  A repeat x-ray in the hospital raise concern for some displacement of the fracture, so orthopedic consultation was requested.  The patient does note moderate discomfort in his shoulder.  He is not in a shoulder immobilizer on today's visit.  He denies any numbness or paresthesias down his arm to his hand.  Past Medical History:  Diagnosis Date   Acute metabolic encephalopathy 12/02/2020   DKA (diabetic ketoacidoses) 04/06/2016   Hypercholesteremia    Hypertension    Past Surgical History:  Procedure Laterality Date   AMPUTATION TOE Left  04/25/2022   Procedure: LEFT GREAT TOE AMPUTATION AND LEFT PARTIAL 2ND TOE AMPUTATION;  Surgeon: Janit Thresa HERO, DPM;  Location: [facility] ORS;  Service: Podiatry;  Laterality: Left;   COLONOSCOPY WITH PROPOFOL  N/A 02/01/2020   Procedure: COLONOSCOPY WITH PROPOFOL ;  Surgeon: Unk Corinn Skiff, MD;  Location: [facility] END[facility]COPY;  Service: Gastroenterology;  Laterality: N/A;   ESOPHAGOGASTRODUODENOSCOPY  02/01/2020   Procedure: ESOPHAGOGASTRODUODENOSCOPY (EGD);  Surgeon: Unk Corinn Skiff, MD;  Location: [facility] ENDOSCOPY;  Service: Gastroenterology;;   ESOPHAGOGASTRODUODENOSCOPY (EGD) WITH PROPOFOL  N/A 06/07/2022   Procedure: ESOPHAGOGASTRODUODENOSCOPY (EGD) WITH PROPOFOL ;  Surgeon: Saintclair Jasper, MD;  Location: [facility] ENDOSCOPY;  Service: Gastroenterology;  Laterality: N/A;   KNEE SURGERY Right    Torn meniscus   KNEE SURGERY Left    Social History   Socioeconomic History   Marital status: Single    Spouse name: Not on file   Number of children: 3   Years of education: Not on file   Highest education level: High school graduate  Occupational History    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  Tobacco Use   Smoking status: Never   Smokeless tobacco: Never  Vaping Use   Vaping status: Never Used  Substance and Sexual Activity   Alcohol use: Not Currently    Alcohol/week: 0.0 - 1.0 standard drinks of alcohol    Comment: once every 2 months   Drug use: Yes    Types: Marijuana, Crack cocaine    Comment: last week   Sexual activity: Not Currently    Birth control/protection: None  Other Topics Concern   Not on file  Social History Narrative   Lives with girlfriend sharon   Social Drivers of Dispensing optician  Resource Strain: Low Risk  (10/20/2023)   Received from [facility]   Overall Financial Resource Strain (CARDIA)    Difficulty of Paying Living Expenses: Not hard at all  Food Insecurity: No Food Insecurity (11/16/2023)   Hunger Vital Sign    Worried About Running Out of  Food in the Last Year: Never true    Ran Out of Food in the Last Year: Never true  Transportation Needs: No Transportation Needs (11/16/2023)   PRAPARE - Administrator, Civil Service (Medical): No    Lack of Transportation (Non-Medical): No  Physical Activity: Insufficiently Active (01/01/2020)   Exercise Vital Sign    Days of Exercise per Week: 7 days    Minutes of Exercise per Session: 20 min  Stress: No Stress Concern Present (01/01/2020)   [employer] of Occupational Health - Occupational Stress Questionnaire    Feeling of Stress : Not at all  Social Connections: Moderately Isolated (11/16/2023)   Social Connection and Isolation Panel    Frequency of Communication with Friends and Family: More than three times a week    Frequency of Social Gatherings with Friends and Family: More than three times a week    Attends Religious Services: Never    Database administrator or Organizations: No    Attends Engineer, structural: Never    Marital Status: Living with partner   Family History  Problem Relation Age of Onset   Heart attack Father    Hypertension Sister    Cancer Sister    Allergies  Allergen Reactions   Penicillins Other (See Comments)    Childhood allergy -tolerated amoxil  03/2022 Unknown reaction Has patient had a PCN reaction causing immediate rash, facial/tongue/throat swelling, SOB or lightheadedness with hypotension: No Has patient had a PCN reaction causing severe rash involving mucus membranes or skin necrosis: No Has patient had a PCN reaction that required hospitalization No Has patient had a PCN reaction occurring within the last 10 years: No If all of the above answers are NO, then may proceed with Cephalosporin use.    Prior to Admission medications   Medication Sig Start Date End Date Taking? Authorizing Provider  acetaminophen  (TYLENOL ) 325 MG tablet Take 2 tablets (650 mg total) by mouth every 6 (six) hours as needed for mild pain  (pain score 1-3) or fever. 09/17/23  Yes Wieting, Richard, MD  acyclovir  (ZOVIRAX ) 400 MG tablet Take 400 mg by mouth in the morning and at bedtime.   Yes [provider]  apixaban  (ELIQUIS ) 5 MG TABS tablet Take 5 mg by mouth 2 (two) times daily. 10/20/23  Yes [provider]  atorvastatin  (LIPITOR ) 80 MG tablet Take 1 tablet (80 mg total) by mouth at bedtime. Hold while taking Paxlovid  04/08/22  Yes Caleen Qualia, MD  fludrocortisone  (FLORINEF ) 0.1 MG tablet Take 1 tablet (0.1 mg total) by mouth daily as needed. Skip the dose if SBP >130 Patient taking differently: Take 0.1 mg by mouth daily. Skip the dose if SBP >130 09/17/23  Yes Wieting, Richard, MD  gabapentin  (NEURONTIN ) 300 MG capsule Take 1 capsule (300 mg total) by mouth 2 (two) times daily. 09/17/23  Yes Wieting, Richard, MD  insulin  lispro (HUMALOG) 100 UNIT/ML injection INJECT 150 UNITS EVERY 3 DAYS VIA INSULIN  PUMP AS DIRECTED 10/26/23  Yes [provider]  latanoprost  (XALATAN ) 0.005 % ophthalmic solution 1 drop at bedtime. 11/02/23  Yes [provider]  levETIRAcetam  (KEPPRA ) 500 MG tablet Take 1  tablet by mouth twice daily 09/09/23  Yes Vaslow, [name] K, MD  midodrine  (PROAMATINE ) 5 MG tablet Take 1 tablet (5 mg total) by mouth 3 (three) times daily. 11/13/23  Yes Singh, Prashant K, MD  oxyCODONE -acetaminophen  (PERCOCET/ROXICET) 5-325 MG tablet Take 1 tablet by mouth every 6 (six) hours as needed for severe pain (pain score 7-10).   Yes [provider]  pomalidomide  (POMALYST ) 2 MG capsule Take 1 capsule (2 mg total) by mouth daily. Take for 21 days, the hold for 7 days. Repeat every 28 days. 10/19/23  Yes Melanee Annah BROCKS, MD  Continuous Glucose Sensor (DEXCOM G7 SENSOR) MISC 1 each by Does not apply route 4 (four) times daily -  before meals and at bedtime. 04/10/23   Jhonny Calvin NOVAK, MD  iron  polysaccharides (NIFEREX) 150 MG capsule Take 1 capsule (150 mg total) by mouth daily. Patient not  taking: Reported on 11/11/2023 03/20/23 11/10/23  Von Bellis, MD   No results found.  Positive ROS: All other systems have been reviewed and were otherwise negative with the exception of those mentioned in the HPI and as above.  Physical Exam: General:  Alert, no acute distress Psychiatric:  Patient is of questionable competence for consent, but exhibits normal mood and affect   Cardiovascular:  No pedal edema Respiratory:  No wheezing, non-labored breathing GI:  Abdomen is soft and non-tender Skin:  No lesions in the area of chief complaint Neurologic:  Sensation intact distally Lymphatic:  No axillary or cervical lymphadenopathy  Orthopedic Exam:  Orthopedic examination is limited to the left shoulder and upper extremity.  Skin inspection around the left shoulder is unremarkable.  No swelling, erythema, ecchymosis, abrasions, or other skin abnormalities are identified.  He has mild tenderness to palpation over the anterolateral aspects of the shoulder.  He is able to tolerate gentle internal and external rotation of the shoulder/upper arm with only mild discomfort.  The humerus does appear to move as a unit, suggesting at least partial healing of the proximal humerus fracture.  He is grossly neurovascularly intact to the left upper extremity and hand as he is able to actively flex and extend his elbow, wrist, and hand.  Sensation is intact to light touch to all distributions.  He has good capillary refill to his hand.  X-rays:  Recent x-rays of the left humerus are available for review and have been reviewed by myself.  These films demonstrate a mildly displaced subacute proximal humerus fracture with evidence of some callus suggesting at least partial healing of the fracture.  The fracture appears to be reasonably well aligned clinically.  The humeral head is concentrically located within the glenoid.  No other acute bony processes are identified.  Assessment: Status post subacute left  proximal humerus fracture.  Plan: The treatment options have been discussed with the patient.  At this point, I do not feel that there is any need to consider surgical intervention, given all of his other medical issues and the fact that the fracture does appear to be healing clinically and remains satisfactorily aligned radiographically.  The patient was placed into a shoulder immobilizer as he is not in any immobilizer at this time.  He is to wear the shoulder immobilizer at all times for the next 3 to 4 weeks, removing it only for bathing purposes.  Thank you for asking me to participate in the care of this most pleasant yet unfortunate man.  We will be happy to follow him with you from a  distance.    DOROTHA Reyes Maltos, MD  Beeper #:  [phone]  11/26/2023 8:01 AM

## 2023-11-26 NOTE — Inpatient Diabetes Management (Signed)
 Inpatient Diabetes Program Recommendations  AACE/ADA: New Consensus Statement on Inpatient Glycemic Control (2015)  Target Ranges:  Prepandial:   less than 140 mg/dL      Peak postprandial:   less than 180 mg/dL (1-2 hours)      Critically ill patients:  140 - 180 mg/dL    Latest Reference Range & Units 11/25/23 08:23 11/25/23 11:55 11/25/23 13:49 11/25/23 15:47 11/25/23 17:03 11/25/23 18:27 11/25/23 19:11 11/25/23 21:16 11/25/23 22:06  Glucose-Capillary 70 - 99 mg/dL 587 (H)  10 units Novolog   313 (H)  10 units Novolog   8 units Lantus  201 (H) 51 (L) 76 47 (L) 99 55 (L) 79    Latest Reference Range & Units 11/26/23 00:51 11/26/23 04:38 11/26/23 05:06 11/26/23 05:41 11/26/23 07:44  Glucose-Capillary 70 - 99 mg/dL 86 55 (L) 51 (L) 83 805 (H)  5 units Novolog       History: Type 1 Diabetes, DKA, CKD3, SDH   Home DM Meds: Insulin  Pump                             Dexcom G7 CGM                             Humalog in the Insulin  Pump  (T-slim); Basal rate 12am 0.25 units/hr (24hr = 6 units) ISF 60 mg/dl I:C ratio 20 grams Off pump regimen:  Take Tresiba  6 units at bedtime Lispro/Humalog/NovoLog : before breakfast -- take 1 unit, before lunch -- take 2 units, before supper -- take 4 units + if sugar is over 150 before the meal, you can ADD 1 unit.     Current Orders: Lantus  8 units daily                           Novolog  Sensitive Correction Scale/ SSI (0-9 units) TID AC + HS      Novolog  3 units TID with meals     MD- Note Severe Hypoglycemia yest at 4pm, 6pm, 9pm and again at 4am today  Please consider:  1. Reduce Lantus  back to 6 units daily  2. Reduce the Novolog  SSI to the 0-6 unit Very Sensitive scale    --Will follow patient during hospitalization--  Adina Rudolpho Arrow RN, MSN, CDCES Diabetes Coordinator Inpatient Glycemic Control Team Team Pager: [phone] (8a-5p)

## 2023-11-26 NOTE — Progress Notes (Signed)
 Pts contact, [name] to take pts Dexcom to [facility]

## 2023-11-26 NOTE — Discharge Summary (Signed)
 Physician Discharge Summary   Patient: Nicholas Weiss MRN: [id number] DOB: [birth year]  Admit date:     11/15/2023  Discharge date: 11/26/23  Discharge Physician: Amaryllis Dare   PCP: Alla Amis, MD   Recommendations at discharge:  Please obtain CBC and BMP on follow-up Patient should continue with left shoulder immobilizer and follow-up with orthopedic surgery in 3 to 4 weeks. Patient will resume his insulin  pump and need to have a close follow-up with PCP and endocrinology. 4.  Follow-up with neurosurgery 5.  Home Eliquis  has been held and should not be continued until cleared by neurosurgery.  Discharge Diagnoses: Principal Problem:   Intraventricular hemorrhage (HCC) Active Problems:   Syncope and collapse, recurrent   Fall   Uncontrolled type 1 diabetes mellitus with hyperglycemia, with long-term current use of insulin  (HCC)   Hyponatremia   Seizure disorder (HCC)   Anemia of chronic disease   MCI (mild cognitive impairment) with memory loss   Left supracondylar humerus fracture, sequela   Atrial fibrillation, chronic (HCC)   Protein-calorie malnutrition, moderate (HCC)  Resolved Problems:   * No resolved hospital problems. *  Hospital Course: 73 y.o. male with medical history significant for type I DM, HLD, recent history of fall with subdural hematoma in June 2025, chronic orthostatic hypotension, A-fib on Eliquis , seizure disorder, mild cognitive impairment, EtOH use who was brought in from home after he lost consciousness, fell on the ground.  Patient's wife reported that he did not hit his head.  Patient was confused and not able to provide meaningful history.  He was also having  frequent falls.    Patient was recently admitted to the hospital between 11/11/2023 to 11/13/2023 after presenting a fall.  As noted above he had a similar incident with the fall in June 2025 and had a subdural hematoma and a fractured left upper extremity.  He also had insulin  pump  malfunction and presented with nonketotic hyperosmolar state in August 2025.  Workup revealed intraventricular bleed involving right lateral ventricle and occipital horn of right ventricle on CT scan of the brain, seen by neurosurgery recommended conservative management.  Neurology was consulted, MRI negative for acute stroke, recommend holding anticoagulation long-term due to recurrent falls and history of SDH and now with intraventricular hemorrhage.  Patient was found to be lethargic on 08/20, with CT stable   Rapid response was called on 08/22 as patient was found unresponsive, CT head stable, improved back to baseline within an hour with no intervention. Code stroke was called 08/23, CT stroke stable Consulted Cardiology for management of Orthostatic hypotension, labile  Eliquis  was held and will be discontinued on discharge for now until cleared by neurosurgery.  PT recommending SNF-intermittent confusion and require 48 hours of sitter free before leaving.  Palliative care was also consulted but patient remain full scope of care and full code.  8/27: Blood pressure elevated at 152/80, labs seem stable. Pending placement.  8/28: Remained hemodynamically stable, with some increased somnolence, does not want to be bothered.  CBG elevated-change SSI to sensitive, mealtime coverage to 3 units and Lantus  to 8 units.  ABG also ordered. Consulted orthopedic at family request as they were concerned about more displaced shoulder fracture.  Message sent to Dr. Edie.  8/29: Remained hemodynamically stable, patient was sleeping most of the day yesterday and having few episodes of hypoglycemia.  More alert today.  Orthopedic surgery reevaluated him, no surgical intervention needed at this time, patient was placed in shoulder immobilizer and should  wear it for next 3 to 4 weeks, only remove while bathing.  He need to have a follow-up with orthopedic surgery as outpatient.  His home Eliquis  was  discontinued and should not be resumed until cleared by neurosurgery.  Blood pressure mildly elevated.  Low-dose midodrine  was made as needed because of his history of orthostatic vitals.  Should encourage p.o. hydration.  His Florinef  is also as needed and should be held if systolic blood pressure above 879.  Blood pressure need to be well-controlled with recent intraventricular bleed.  Patient will resume his insulin  pump on discharge and need to have a close follow-up with his primary care provider/endocrinologist for better control of diabetes.  He will continue on current medications and follow-up with his providers for further assistance.  Assessment and Plan: * Intraventricular hemorrhage [facility]) Appreciate neurosurgical consultation recommended conservative management.  MRI does not show any acute stroke. CTA calcified atherosclerosis at carotid bifurcations approximately 50% stenosis of proximal cervical ICAs.  Severe stenosis right vertebral artery origin. -Repeat CT 08/20 unchanged focus of hemorrhage within body of right lateral ventricle, Layering hemorrhage within the occipital horns of both lateral ventricles, slightly increased (likely related to layering/ redistribution) - Holding Eliquis  at discharge. - Waxing and waning mental status with intermittent agitation -Encourage p.o. intake -Need to follow-up with outpatient neurology and neurosurgery with repeat CT in 2 weeks -PT is recommending SNF  Syncope and collapse, recurrent History of orthostatic hypotension.   Continue midodrine  with holding parameters if blood pressure greater than 120 for now.   Fall PT is recommending SNF -TOC is working on placement  Uncontrolled type 1 diabetes mellitus with hyperglycemia, with long-term current use of insulin  (HCC) Uses insulin  pump at home-will resume on discharge  Hyponatremia Resolved  Seizure disorder (HCC) Continue Keppra  and gabapentin   Anemia of chronic  disease Some variation in hemoglobin, currently at 8.4 -Starting on supplement -Continue to monitor -Transfuse if below 7  Protein-calorie malnutrition, moderate (HCC) Estimated body mass index is 19.55 kg/m as calculated from the following:   Height as of this encounter: 5' 10 (1.778 m).   Weight as of this encounter: 61.8 kg.   -Dietitian consult  Atrial fibrillation, chronic (HCC) Stroke risk will be higher being off Eliquis  with his atrial fibrillation.  This is the second time back in the hospital with a brain bleed.  Benefits and risks of anticoagulation was discontinued  Left supracondylar humerus fracture, sequela Seen on hospitalization 2 months ago - Continue shoulder sling for support as needed -Repeat imaging with increased displacement of comminuted proximal humerus fracture since June which may represent delayed fracture healing or refracture at the site of previous fracture. - Orthopedic surgery reevaluated him at family's request and they were recommending continuation of left shoulder immobilizer for another 3 to 4 weeks and outpatient follow-up.  MCI (mild cognitive impairment) with memory loss As needed risperidol for agitation. Seroquel  at night. - Discontinuing one-to-one sitter and see if he can be managed with telemetry sitter only - Palliative care was also consulted but patient would like to stay full code and full scope of care      Pain control - [place]  Controlled Substance Reporting System database was reviewed. and patient was instructed, not to drive, operate heavy machinery, perform activities at heights, swimming or participation in water activities or provide baby-sitting services while on Pain, Sleep and Anxiety Medications; until their outpatient Physician has advised to do so again. Also recommended to not to take more  than prescribed Pain, Sleep and Anxiety Medications.  Consultants: Orthopedic surgery.  Palliative care.   Neurosurgery Procedures performed: None Disposition: Skilled nursing facility Diet recommendation:  Discharge Diet Orders (From admission, onward)     Start     Ordered   11/26/23 0000  Diet - low sodium heart healthy        11/26/23 1018           Cardiac and Carb modified diet DISCHARGE MEDICATION: Allergies as of 11/26/2023       Reactions   Penicillins Other (See Comments)   Childhood allergy -tolerated amoxil  03/2022 Unknown reaction Has patient had a PCN reaction causing immediate rash, facial/tongue/throat swelling, SOB or lightheadedness with hypotension: No Has patient had a PCN reaction causing severe rash involving mucus membranes or skin necrosis: No Has patient had a PCN reaction that required hospitalization No Has patient had a PCN reaction occurring within the last 10 years: No If all of the above answers are NO, then may proceed with Cephalosporin use.        Medication List     STOP taking these medications    apixaban  5 MG Tabs tablet Commonly known as: ELIQUIS    iron  polysaccharides 150 MG capsule Commonly known as: NIFEREX       TAKE these medications    acetaminophen  325 MG tablet Commonly known as: TYLENOL  Take 2 tablets (650 mg total) by mouth every 6 (six) hours as needed for mild pain (pain score 1-3) or fever.   acyclovir  400 MG tablet Commonly known as: ZOVIRAX  Take 400 mg by mouth in the morning and at bedtime.   atorvastatin  80 MG tablet Commonly known as: LIPITOR  Take 1 tablet (80 mg total) by mouth at bedtime. Hold while taking Paxlovid    Dexcom G7 Sensor Misc 1 each by Does not apply route 4 (four) times daily -  before meals and at bedtime.   Fe Fum-Vit C-Vit B12-FA Caps capsule Commonly known as: TRIGELS-F FORTE Take 1 capsule by mouth 2 (two) times daily.   fludrocortisone  0.1 MG tablet Commonly known as: FLORINEF  Take 1 tablet (0.1 mg total) by mouth daily as needed. Skip the dose if SBP >130 What changed: when  to take this   gabapentin  300 MG capsule Commonly known as: NEURONTIN  Take 1 capsule (300 mg total) by mouth 2 (two) times daily.   insulin  lispro 100 UNIT/ML injection Commonly known as: HUMALOG INJECT 150 UNITS EVERY 3 DAYS VIA INSULIN  PUMP AS DIRECTED   latanoprost  0.005 % ophthalmic solution Commonly known as: XALATAN  1 drop at bedtime.   levETIRAcetam  500 MG tablet Commonly known as: KEPPRA  Take 1 tablet by mouth twice daily   midodrine  2.5 MG tablet Commonly known as: PROAMATINE  Take 1 tablet (2.5 mg total) by mouth 3 (three) times daily as needed (For systolic blood pressure less than 100). What changed:  medication strength how much to take when to take this reasons to take this   multivitamin with minerals Tabs tablet Take 1 tablet by mouth daily. Start taking on: November 27, 2023   oxyCODONE -acetaminophen  5-325 MG tablet Commonly known as: PERCOCET/ROXICET Take 1 tablet by mouth every 6 (six) hours as needed for severe pain (pain score 7-10).   polyethylene glycol 17 g packet Commonly known as: MIRALAX  / GLYCOLAX  Take 17 g by mouth daily as needed for mild constipation.   pomalidomide  2 MG capsule Commonly known as: POMALYST  Take 1 capsule (2 mg total) by mouth daily. Take for 21 days,  the hold for 7 days. Repeat every 28 days.   risperiDONE  0.5 MG tablet Commonly known as: RISPERDAL  Take 1 tablet (0.5 mg total) by mouth every 8 (eight) hours as needed (agitation).        Contact information for follow-up providers     Alla Amis, MD Follow up.   Specialty: Family Medicine Why: hospital follow up Contact information: [address] [facility] [place] [address] [phone]         Dewane Shiner, DO. Go in 1 week(s).   Specialty: Cardiology Contact information: [address] [place] [address] [phone]              Contact information for after-discharge care     Destination      [facility] [employer] .   Service: Skilled Nursing Contact information: [address] [place]  [address] [phone]                    Discharge Exam: [name] Weights   11/15/23 0409 11/15/23 2154  Weight: 63 kg 61.8 kg   General.  Frail elderly man, in no acute distress. Pulmonary.  Lungs clear bilaterally, normal respiratory effort. CV.  Regular rate and rhythm, no JVD, rub or murmur. Abdomen.  Soft, nontender, nondistended, BS positive. CNS.  Alert and oriented .  No focal neurologic deficit. Extremities.  No edema, pulses intact and symmetrical.  Left shoulder immobilizer in place  Condition at discharge: stable  The results of significant diagnostics from this hospitalization (including imaging, microbiology, ancillary and laboratory) are listed below for reference.   Imaging Studies: DG Shoulder Left Result Date: 11/23/2023 EXAM: 1 VIEW XRAY OF THE LEFT SHOULDER 11/23/2023 04:27:00 PM COMPARISON: Shoulder and humerus radiographs 09/23/23. CLINICAL HISTORY: Fracture.  Per sitter patient has had a recent fall. FINDINGS: BONES AND JOINTS: Increased displacement of comminuted displaced fracture of the proximal humerus involving the surgical neck and proximal metadiaphysis with slight apex lateral angulation. Some incomplete peripheral callus formation since prior exam. The fracture line remains readily visible. Mild degenerative changes of the glenohumeral and acromioclavicular joints. SOFT TISSUES: Subcutaneous soft tissue edema. Visualized lung is unremarkable. IMPRESSION: 1. Increased displacement of comminuted proximal humerus fracture since June. This may represent delayed fracture healing or re-fracture at the site of previous proximal humeral fracture. Electronically signed by: Andrea Gasman MD 11/23/2023 04:50 PM EDT RP Workstation: [id number]   CT HEAD CODE STROKE WO CONTRAST` Result Date: 11/20/2023 CLINICAL DATA:  Provided history: Neuro deficit,  acute, stroke suspected. EXAM: CT HEAD WITHOUT CONTRAST TECHNIQUE: Contiguous axial images were obtained from the base of the skull through the vertex without intravenous contrast. RADIATION DOSE REDUCTION: This exam was performed according to the departmental dose-optimization program which includes automated exposure control, adjustment of the mA and/or kV according to patient size and/or use of iterative reconstruction technique. COMPARISON:  Head CT 11/19/2023. FINDINGS: Brain: Generalized cerebral atrophy. A focus of hemorrhage within the body of the right lateral ventricle is unchanged as compared to yesterday's head CT, again measuring 1.7 x 1.3 cm in transaxial dimensions. Small-volume hemorrhage within the occipital horns of both lateral ventricles, also unchanged. Stable size and configuration of the ventricular system. No evidence of an interval acute intracranial hemorrhage. Patchy and ill-defined hypoattenuation within the cerebral white matter, nonspecific but compatible with moderate chronic small vessel ischemic disease. Prominent dural calcifications. No demarcated cortical infarct. No extra-axial fluid collection. No midline shift. Vascular: No hyperdense vessel.  Atherosclerotic calcifications. Skull: No  calvarial fracture or aggressive osseous lesion. Sinuses/Orbits: No mass or acute finding within the imaged orbits. Minimal mucosal thickening within the right maxillary sinus at the imaged levels. Tiny mucous retention cyst within the left maxillary sinus at the imaged levels. No evidence of an interval acute intracranially as compared yesterday's head CT. These results were communicated to Dr. Voncile at 3:48 pmon 8/23/2025by text page via the [facility] messaging system. IMPRESSION: 1. 1.7 x 1.3 cm focus of hemorrhage within the body of the right lateral ventricle, unchanged from yesterday's head CT. 2. Small-volume hemorrhage within the occipital horns of both lateral ventricles, also unchanged. 3.  Stable size and configuration of the ventricular system. 4. No evidence of an interval acute intracranial hemorrhage. 5. Moderate chronic small vessel ischemic changes within the cerebral white matter. 6. Generalized cerebral atrophy Electronically Signed   By: Rockey Childs D.O.   On: 11/20/2023 15:48   CT HEAD WO CONTRAST ( ) Result Date: 11/19/2023 CLINICAL DATA:  Mental status change. Follow-up intracranial hemorrhage EXAM: CT HEAD WITHOUT CONTRAST TECHNIQUE: Contiguous axial images were obtained from the base of the skull through the vertex without intravenous contrast. RADIATION DOSE REDUCTION: This exam was performed according to the departmental dose-optimization program which includes automated exposure control, adjustment of the mA and/or kV according to patient size and/or use of iterative reconstruction technique. COMPARISON:  11/17/2023.  Older studies as distant as 11/11/2023. FINDINGS: Brain: No focal abnormality affects the brainstem or cerebellum. Focal clot within the right lateral ventricle persists and is slightly smaller, measuring 13 x 17 mm today. Some layering blood dependent in the atria and occipital horns of the lateral ventricles, less by volume. Ventricular size is stable without developing hydrocephalus. No intraparenchymal hemorrhage. No subdural hematoma. Mild chronic small-vessel ischemic changes affect the cerebral hemispheric white matter. Patient has a tendency towards benign dural calcification. Vascular: There is atherosclerotic calcification of the major vessels at the base of the brain. Skull: Negative Sinuses/Orbits: Clear/normal Other: None IMPRESSION: Focal clot within the right lateral ventricle persists and is slightly smaller, measuring 13 x 17 mm today. Some layering blood dependent in the atria and occipital horns of the lateral ventricles, less by volume. Ventricular size is stable without developing hydrocephalus. No new or worsening intracranial hemorrhage.  Electronically Signed   By: Oneil Officer M.D.   On: 11/19/2023 16:42   CT HEAD WO CONTRAST ( ) Result Date: 11/17/2023 CLINICAL DATA:  Provided history: Stroke, hemorrhagic. Intraventricular hemorrhage. EXAM: CT HEAD WITHOUT CONTRAST TECHNIQUE: Contiguous axial images were obtained from the base of the skull through the vertex without intravenous contrast. RADIATION DOSE REDUCTION: This exam was performed according to the departmental dose-optimization program which includes automated exposure control, adjustment of the mA and/or kV according to patient size and/or use of iterative reconstruction technique. COMPARISON:  Non-contrast head CT and CT angiogram head/neck 11/16/2023. FINDINGS: Brain: Generalized cerebral atrophy. The focus of hemorrhage within the body of the right lateral ventricle has not significantly changed, again measuring 2.3 x 1.2 cm. Layering hemorrhage within the occipital horns of both lateral ventricles, slightly increased. Unchanged size and configuration of the ventricular system. Patchy and ill-defined hypoattenuation within the cerebral white matter, nonspecific but compatible with moderate chronic small vessel ischemic disease. No demarcated cortical infarct. No extra-axial fluid collection. No evidence of an intracranial mass. No midline shift. Vascular: No hyperdense vessel.  Atherosclerotic calcifications. Skull: No calvarial fracture or aggressive osseous lesion. Sinuses/Orbits: No mass or acute finding within the imaged orbits. Trace mucosal thickening  within the left maxillary sinus at the imaged levels. Impression #2 will be called to the ordering clinician or representative by the Radiologist Assistant, and communication documented in the PACS or [employer]. IMPRESSION: 1. Unchanged focus of hemorrhage within the body of the right lateral ventricle, again measuring 2.3 x 1.2 cm. 2. Layering hemorrhage within the occipital horns of both lateral ventricles, slightly  increased. 3. Unchanged size and configuration of the ventricular system. 4. Moderate chronic small vessel ischemic changes within the cerebral white matter. 5. Generalized cerebral atrophy. Electronically Signed   By: Rockey Childs D.O.   On: 11/17/2023 10:52   CT ANGIO HEAD NECK W WO CM Result Date: 11/16/2023 EXAM: CTA HEAD AND NECK WITHOUT AND WITH 11/16/2023 03:43:50 PM TECHNIQUE: CTA of the head and neck was performed without and with the administration of 75 mL of intravenous iohexol  (OMNIPAQUE ) 350 MG/ML contrast. Multiplanar 2D and/or 3D reformatted images are provided for review. Automated exposure control, iterative reconstruction, and/or weight based adjustment of the mA/kV was utilized to reduce the radiation dose to as low as reasonably achievable. Stenosis of the internal carotid arteries measured using NASCET criteria. COMPARISON: Same day MRI head and CT head 11/15/23 and CTA head and neck 02/15/23. CLINICAL HISTORY: Stroke, hemorrhagic; Isolated IVH, likely traumatic due to fall and coagulopathy from Eliquis , evaluate for vascular malformation. FINDINGS: CTA NECK: AORTIC ARCH AND ARCH VESSELS: Mild atherosclerosis of the visualized aortic arch. No dissection or arterial injury. No significant stenosis of the brachiocephalic or subclavian arteries. CERVICAL CAROTID ARTERIES: Calcified atherosclerosis at the right carotid bifurcation resulting in approximately 50% stenosis of the proximal right cervical ICA, similar to prior. Additional calcified atherosclerosis at the left carotid bifurcation resulting in approximately 50% stenosis of the proximal left cervical ICA, similar to prior. No dissection or arterial injury. CERVICAL VERTEBRAL ARTERIES: The vertebral arteries are patent from the origins to the vertebrobasilar confluence. Severe stenosis at the right vertebral artery origin. No dissection or arterial injury. LUNGS AND MEDIASTINUM: Unremarkable. SOFT TISSUES: No acute abnormality. BONES:  Degenerative changes in the spine with disc space narrowing greatest at C5-6 and C6-7. No acute abnormality. CTA HEAD: ANTERIOR CIRCULATION: The intracranial internal carotid arteries appear patent to the ICA termini. There is atherosclerosis of the carotid siphons without evidence of high-grade stenosis. The MCA's are patent proximally. There is limited evaluation of distal MCA branches due to motion artifact. The right A1 segment is hypoplastic. ACA are otherwise patent proximally. Distal ACA branch is not well visualized. No aneurysm. POSTERIOR CIRCULATION: The vertebral artery is patent. Posterior cerebral arteries are patent bilaterally with limited evaluation of the distal branches. No significant stenosis of the basilar artery. No aneurysm. OTHER: No dural venous sinus thrombosis on this non-dedicated study. Focus of intracranial hemorrhage within the body of the right lateral ventricle appears slightly increased compared to the recent prior CT. Trace layering blood products in the occipital horns noted. No midline shift. Nonspecific hypoattenuation in the periventricular and subcortical white matter, most likely representing chronic small vessel disease. Evaluation of intracranial arterial vasculature is limited due to motion artifact. IMPRESSION: 1. Examination limited by motion artifact. Within this limitation, no evidence of vascular malformation. 2. Calcified atherosclerosis at the carotid bifurcations resulting in approximately 50% stenosis of the proximal cervical ICAs, similar to prior. 3. Severe stenosis at the right vertebral artery origin. 4. Mild atherosclerosis of the visualized aortic arch. Electronically signed by: Donnice Mania MD 11/16/2023 04:12 PM EDT RP Workstation: [id number]   MR BRAIN  WO CONTRAST Result Date: 11/16/2023 EXAM: MRI BRAIN WITHOUT CONTRAST 11/16/2023 11:20:00 AM TECHNIQUE: Multiplanar multisequence MRI of the head/brain was performed without the administration of  intravenous contrast. COMPARISON: None available. CLINICAL HISTORY: 73 y.o. male with hx of type 1 diabetes, chronic orthostatic hypotension, paroxysmal atrial fibrillation on Eliquis , seizures, mild cognitive impairment, recent fall, and subdural hematoma in June 2025. Presented to emergency department after a fall with intraventricular hemorrhage. FINDINGS: BRAIN AND VENTRICLES: No acute infarct. No mass. No midline shift. The ventricles are similar in caliber to the prior CT. T2/FLAIR intensity in the periventricular and subcortical white matter suggestive of mild-to-moderate chronic microvascular ischemic changes. Mild volume loss. Chronic microhemorrhage in the left frontal lobe. Additional susceptibility along the falx and tentorium corresponding to areas of mineralization on CT. Focal signal abnormality and susceptibility within the body of the right lateral ventricle corresponding to intraventricular blood products noted on CT. Additional scattered areas of susceptibility within the body of the left lateral ventricle with additional layering susceptibility in the occipital horns compatible with known intraventricular blood products accounting for differences in modality. ORBITS: No acute abnormality. SINUSES AND MASTOIDS: Mild mucosal thickening in the maxillary sinuses. BONES AND SOFT TISSUES: Normal marrow signal. No acute soft tissue abnormality. IMPRESSION: 1. Intraventricular blood products in the lateral ventricles, similar to recent CT. 2. No midline shift. 3. Similar caliber of the ventricles. 4. Mild-to-moderate chronic microvascular ischemic changes. Mild volume loss. Electronically signed by: Donnice Mania MD 11/16/2023 12:15 PM EDT RP Workstation: [id number]   ECHOCARDIOGRAM COMPLETE Result Date: 11/16/2023    ECHOCARDIOGRAM REPORT   Patient Name:   Nicholas Weiss Date of Exam: 11/16/2023 Medical Rec #:  [id number]       Height:       70.0 in Accession #:    [id number]      Weight:       136.2  lb Date of Birth:  05/11/1950      BSA:          1.773 m Patient Age:    72 years        BP:           164/86 mmHg Patient Gender: M               HR:           62 bpm. Exam Location:  [facility] Procedure: 2D Echo, Cardiac Doppler and Color Doppler (Both Spectral and Color            Flow Doppler were utilized during procedure). Indications:     Stroke I63.9  History:         Patient has prior history of Echocardiogram examinations, most                  recent 03/10/2023.  Sonographer:     Rosina Dunk Referring Phys:  [id number] [facility]UDEL Diagnosing Phys: Lonni Hanson MD  Sonographer Comments: No subcostal window. IMPRESSIONS  1. Left ventricular ejection fraction, by estimation, is 65 to 70%. The left ventricle has normal function. Left ventricular endocardial border not optimally defined to evaluate regional wall motion. There is moderate left ventricular hypertrophy. Left ventricular diastolic function could not be evaluated.  2. Right ventricular systolic function is normal. The right ventricular size is normal.  3. A small pericardial effusion is present.  4. The mitral valve is degenerative. Trivial mitral valve regurgitation. No evidence of mitral stenosis.  5. The aortic valve was not well visualized. There is  moderate calcification of the aortic valve. There is moderate thickening of the aortic valve. Aortic valve regurgitation is not visualized. Aortic valve sclerosis/calcification is present, without any evidence of aortic stenosis.  6. Rhythm strip during this exam demonstrates atrial fibrillation. FINDINGS  Left Ventricle: Left ventricular ejection fraction, by estimation, is 65 to 70%. The left ventricle has normal function. Left ventricular endocardial border not optimally defined to evaluate regional wall motion. The left ventricular internal cavity size was normal in size. There is moderate left ventricular hypertrophy. Left ventricular diastolic function could not be evaluated due to  atrial fibrillation. Left ventricular diastolic function could not be evaluated. Right Ventricle: The right ventricular size is normal. No increase in right ventricular wall thickness. Right ventricular systolic function is normal. Left Atrium: Left atrial size was normal in size. Right Atrium: Right atrial size was normal in size. Pericardium: A small pericardial effusion is present. Mitral Valve: The mitral valve is degenerative in appearance. There is mild thickening of the mitral valve leaflet(s). There is mild calcification of the mitral valve leaflet(s). Trivial mitral valve regurgitation. No evidence of mitral valve stenosis. MV peak gradient, 10.6 mmHg. The mean mitral valve gradient is 4.0 mmHg. Tricuspid Valve: The tricuspid valve is not well visualized. Tricuspid valve regurgitation is trivial. Aortic Valve: The aortic valve was not well visualized. There is moderate calcification of the aortic valve. There is moderate thickening of the aortic valve. Aortic valve regurgitation is not visualized. Aortic valve sclerosis/calcification is present, without any evidence of aortic stenosis. Aortic valve mean gradient measures 3.0 mmHg. Aortic valve peak gradient measures 5.8 mmHg. Aortic valve area, by VTI measures 2.51 cm. Pulmonic Valve: The pulmonic valve was not well visualized. Pulmonic valve regurgitation is not visualized. No evidence of pulmonic stenosis. Aorta: The aortic root is normal in size and structure. Pulmonary Artery: The pulmonary artery is not well seen. IAS/Shunts: The interatrial septum was not well visualized. EKG: Rhythm strip during this exam demonstrates atrial fibrillation.  LEFT VENTRICLE PLAX 2D LVIDd:         3.60 cm     Diastology LVIDs:         2.00 cm     LV e' medial:    10.00 cm/s LV PW:         1.40 cm     LV E/e' medial:  7.3 LV IVS:        1.20 cm     LV e' lateral:   14.40 cm/s LVOT diam:     2.20 cm     LV E/e' lateral: 5.1 LV SV:         55 LV SV Index:   31 LVOT Area:      3.80 cm  LV Volumes (MOD) LV vol d, MOD A2C: 29.5 ml LV vol d, MOD A4C: 35.2 ml LV vol s, MOD A2C: 11.5 ml LV vol s, MOD A4C: 16.2 ml LV SV MOD A2C:     18.0 ml LV SV MOD A4C:     35.2 ml LV SV MOD BP:      19.8 ml RIGHT VENTRICLE RV Basal diam:  2.70 cm RV Mid diam:    2.40 cm RV S prime:     12.00 cm/s TAPSE (M-mode): 1.7 cm LEFT ATRIUM             Index        RIGHT ATRIUM          Index LA diam:  3.50 cm 1.97 cm/m   RA Area:     9.41 cm LA Vol (A2C):   45.8 ml 25.83 ml/m  RA Volume:   16.70 ml 9.42 ml/m LA Vol (A4C):   52.5 ml 29.61 ml/m LA Biplane Vol: 53.6 ml 30.23 ml/m  AORTIC VALVE                    PULMONIC VALVE AV Area (Vmax):    2.28 cm     PV Vmax:        1.23 m/s AV Area (Vmean):   2.28 cm     PV Vmean:       81.600 cm/s AV Area (VTI):     2.51 cm     PV VTI:         0.197 m AV Vmax:           120.00 cm/s  PV Peak grad:   6.1 mmHg AV Vmean:          78.500 cm/s  PV Mean grad:   3.0 mmHg AV VTI:            0.220 m      RVOT Peak grad: 5 mmHg AV Peak Grad:      5.8 mmHg AV Mean Grad:      3.0 mmHg LVOT Vmax:         71.90 cm/s LVOT Vmean:        47.100 cm/s LVOT VTI:          0.145 m LVOT/AV VTI ratio: 0.66  AORTA Ao Root diam: 3.50 cm MITRAL VALVE MV Area (PHT): 3.33 cm    SHUNTS MV Area VTI:   1.64 cm    Systemic VTI:  0.14 m MV Peak grad:  10.6 mmHg   Systemic Diam: 2.20 cm MV Mean grad:  4.0 mmHg    Pulmonic VTI:  0.165 m MV Vmax:       1.63 m/s MV Vmean:      84.6 cm/s MV Decel Time: 228 msec MV E velocity: 73.10 cm/s Lonni Hanson MD Electronically signed by Lonni Hanson MD Signature Date/Time: 11/16/2023/10:47:56 AM    Final    CT HEAD WO CONTRAST ( ) Result Date: 11/15/2023 EXAM: CT HEAD WITHOUT CONTRAST 11/15/2023 01:39:27 PM TECHNIQUE: CT of the head was performed without the administration of intravenous contrast. Automated exposure control, iterative reconstruction, and/or weight based adjustment of the mA/kV was utilized to reduce the radiation dose to as low as  reasonably achievable. COMPARISON: CT head earlier same day. CLINICAL HISTORY: Head trauma, intracranial arterial injury suspected; repeat 6 hour. FINDINGS: BRAIN AND VENTRICLES: There is a similar focus of intraventricular hemorrhage within the body of the right lateral ventricle which is similar to prior. Additional smaller focus of hemorrhage within the body of the left lateral ventricle is slightly decreased in size. There is a small focus of hemorrhage extending into the right occipital horn likely reflecting a redistribution of blood products. The hemorrhage in the left occipital horn seen on prior CT is not well visualized. There are no areas of parenchymal hemorrhage visualized. No midline shift. Similar dural calcifications. Mild probable volume loss. ORBITS: No acute abnormality. SINUSES: No acute abnormality. SOFT TISSUES AND SKULL: No acute soft tissue abnormality. No skull fracture. IMPRESSION: 1. Similar focus of intraventricular hemorrhage within the body of the right lateral ventricle. 2. Additional smaller focus of hemorrhage within the body of the left lateral ventricle, slightly decreased in size. 3. Small  focus of hemorrhage in the right occipital horn is new from prior, possibly redistribution of blood products. Electronically signed by: Donnice Mania MD 11/15/2023 02:15 PM EDT RP Workstation: [id number]   CT HEAD WO CONTRAST ( ) Addendum Date: 11/15/2023 ADDENDUM REPORT: 11/15/2023 08:00 ADDENDUM: Study discussed by telephone with Dr. Ernest in the ED on 11/15/2023 at 0755 hours. Electronically Signed   By: VEAR Hurst M.D.   On: 11/15/2023 08:00   Result Date: 11/15/2023 CLINICAL DATA:  73 year old male with syncope, fall with pain. History of multiple myeloma. EXAM: CT HEAD WITHOUT CONTRAST TECHNIQUE: Contiguous axial images were obtained from the base of the skull through the vertex without intravenous contrast. RADIATION DOSE REDUCTION: This exam was performed according to the departmental  dose-optimization program which includes automated exposure control, adjustment of the mA and/or kV according to patient size and/or use of iterative reconstruction technique. COMPARISON:  Recent noncontrast head CT 11/11/2023. Brain MRI 10/18/2022. FINDINGS: Brain: Small volume right greater than left lateral intraventricular hemorrhage (series 3, image 17, coronal image 34) is new. No definite transependymal extension of blood. No ventriculomegaly. Trace layering occipital horn blood on the left is also new series 3, image 14. No other acute intracranial hemorrhage identified. No intracranial mass effect or midline shift. Stable gray-white matter differentiation throughout the brain. No cortically based acute infarct identified. Incidental bulky chronic dural calcifications. Vascular: Calcified atherosclerosis at the skull base. No suspicious intracranial vascular hyperdensity. Skull: Appears stable and intact. No destructive osseous lesion identified. Sinuses/Orbits: Visualized paranasal sinuses and mastoids are stable and well aerated. Other: No acute orbit or scalp soft tissue injury identified. ---------------------------------------------------- Traumatic Brain Injury Risk Stratification Skull Fracture: No - Low/mBIG 1 Subdural Hematoma (SDH): No - Low Subarachnoid Hemorrhage [facility]): No Epidural Hematoma (EDH): No - Low/mBIG 1 Cerebral contusion, intra-axial, intraparenchymal Hemorrhage (IPH): No Intraventricular Hemorrhage (IVH): Yes - High/mBIG 3 Midline Shift > 1mm or Edema/effacement of sulci/vents: No - Low/mBIG 1 ---------------------------------------------------- IMPRESSION: 1. Positive for small volume acute intraventricular hemorrhage, greater on the right and likely posttraumatic. No associated ventriculomegaly. 2. No other acute intracranial abnormality or acute traumatic injury identified. Electronically Signed: By: VEAR Hurst M.D. On: 11/15/2023 07:48   CT Cervical Spine Wo Contrast Result Date:  11/15/2023 CLINICAL DATA:  Syncope with fall.  Left arm pain. EXAM: CT CERVICAL SPINE WITHOUT CONTRAST TECHNIQUE: Multidetector CT imaging of the cervical spine was performed without intravenous contrast. Multiplanar CT image reconstructions were also generated. RADIATION DOSE REDUCTION: This exam was performed according to the departmental dose-optimization program which includes automated exposure control, adjustment of the mA and/or kV according to patient size and/or use of iterative reconstruction technique. COMPARISON:  08/27/2023 FINDINGS: Alignment: Trace degenerative retrolisthesis of C5 on 6 and C6 on 7 with corresponding loss of disc height at both levels. No evidence for traumatic subluxation. Skull base and vertebrae: No acute fracture. No primary bone lesion or focal pathologic process. Soft tissues and spinal canal: No prevertebral fluid or swelling. No visible canal hematoma. Disc levels: Loss of disc height noted C5-6 and C6-7. Facets are well aligned bilaterally with some degenerative left-sided facet disease in the mid cervical levels. Upper chest: Unremarkable. Other: None. IMPRESSION: Degenerative changes in the cervical spine without evidence for an acute fracture or traumatic subluxation. Electronically Signed   By: Camellia Candle M.D.   On: 11/15/2023 07:43   DG Chest Portable 1 View Result Date: 11/15/2023 CLINICAL DATA:  73 year old male with syncope, fall with pain. History of multiple myeloma. EXAM:  PORTABLE CHEST 1 VIEW COMPARISON:  Portable chest 11/11/2023. FINDINGS: Portable AP semi upright view at 0621 hours. Partially visible fracture of the proximal left humerus shaft, medial displacement, this area not included on recent comparisons. Normal lung volumes and mediastinal contours. Visualized tracheal air column is within normal limits. Allowing for portable technique the lungs are clear. Negative visible bowel gas pattern. No other No acute osseous abnormality identified.  IMPRESSION: 1. Partially visible displaced proximal left humerus fracture. 2. No acute cardiopulmonary abnormality. Electronically Signed   By: VEAR Hurst M.D.   On: 11/15/2023 06:45   DG Pelvis Portable Result Date: 11/15/2023 CLINICAL DATA:  73 year old male with syncope, fall with pain. History of multiple myeloma. EXAM: PORTABLE PELVIS 1-2 VIEWS COMPARISON:  Abdominal radiographs 08/30/2023 and earlier. FINDINGS: Portable AP supine view at 0620 hours. Pelvis bone mineralization appears stable and within normal limits. Femoral heads appear located. No pelvis fracture identified. Grossly intact proximal femurs. Calcified iliofemoral atherosclerosis. Nonobstructed bowel-gas pattern. IMPRESSION: No acute fracture or dislocation identified about the pelvis. Electronically Signed   By: VEAR Hurst M.D.   On: 11/15/2023 06:43   DG Chest Port 1 View Result Date: 11/11/2023 EXAM: 1 VIEW XRAY OF THE CHEST 11/11/2023 03:33:00 PM COMPARISON: AP radiograph of the chest dated 09/23/2023. CLINICAL HISTORY: Fall trauma. FINDINGS: LUNGS AND PLEURA: No focal pulmonary opacity. No pulmonary edema. No pleural effusion. No pneumothorax. HEART AND MEDIASTINUM: No acute abnormality of the cardiac and mediastinal silhouettes. BONES AND SOFT TISSUES: No acute osseous abnormality. IMPRESSION: 1. No acute process. Electronically signed by: evalene coho 11/11/2023 03:58 PM EDT RP Workstation: [id number]   CT Cervical Spine Wo Contrast Result Date: 11/11/2023 EXAM: CT CERVICAL SPINE WITHOUT CONTRAST 11/11/2023 03:50:08 PM TECHNIQUE: CT of the cervical spine was performed without the administration of intravenous contrast. Multiplanar reformatted images are provided for review. Automated exposure control, iterative reconstruction, and/or weight based adjustment of the mA/kV was utilized to reduce the radiation dose to as low as reasonably achievable. COMPARISON: CT of the cervical spine dated 08/27/2023. CLINICAL HISTORY: Neck pain,  acute, no red flags. PT BIB [facility] from home. PT was outside and fell. Fall was witnessed by son who states he hit his head. PT denies hitting head. PT is slightly confused which is not new but no dx of dementia. In the last 12 hrs. PT reportedly pulled out more of the wiring in his insulin  pump. CBG 598 on scene and minimal fluids given. FINDINGS: CERVICAL SPINE: BONES AND ALIGNMENT: No acute fracture or traumatic malalignment. DEGENERATIVE CHANGES: Slight degenerative ventralisthesis at C3-4. Chronic degenerative disc disease at C5-6 and C6-7 with mild central spinal canal stenosis at both levels. SOFT TISSUES: No prevertebral soft tissue swelling. VASCULATURE: Moderate calcification within the carotid bulbs bilaterally. IMPRESSION: 1. No acute abnormality of the cervical spine related to the reported fall. 2. Chronic degenerative disc disease at C5-6 and C6-7 with mild central spinal canal stenosis at both levels. Electronically signed by: evalene coho 11/11/2023 03:57 PM EDT RP Workstation: [id number]   CT Head Wo Contrast Result Date: 11/11/2023 EXAM: CT HEAD WITHOUT CONTRAST 11/11/2023 03:50:08 PM TECHNIQUE: CT of the head was performed without the administration of intravenous contrast. Automated exposure control, iterative reconstruction, and/or weight based adjustment of the mA/kV was utilized to reduce the radiation dose to as low as reasonably achievable. COMPARISON: CT of the head dated 09/14/2023. CLINICAL HISTORY: Head trauma, moderate-severe. PT BIB [facility] from home. PT was outside and fell. Fall was witnessed by son who  states he hit his head. PT denies hitting head. PT is slightly confused which is not new but no dx of dementia. In the last 12 hrs. PT reportedly pulled out more of the wiring in his insulin  pump. CBG 598 on scene and minimal fluids given. FINDINGS: BRAIN AND VENTRICLES: No acute hemorrhage. Gray-white differentiation is preserved. No hydrocephalus. No extra-axial collection. No  mass effect or midline shift. Age-related cerebral volume loss and mild periventricular white matter disease. ORBITS: No acute abnormality. SINUSES: No acute abnormality. SOFT TISSUES AND SKULL: No acute soft tissue abnormality. No skull fracture. Extensive dystrophic calcifications along the falx. Moderate vascular calcifications. The previously described subdural hematoma has resolved. IMPRESSION: 1. No acute intracranial abnormality related to the head trauma. 2. Age-related cerebral volume loss and mild periventricular white matter disease. 3. Extensive dystrophic calcifications along the falx and moderate vascular calcifications. Electronically signed by: evalene coho 11/11/2023 03:54 PM EDT RP Workstation: [id number]    Microbiology: Results for orders placed or performed during the hospital encounter of 11/15/23  MRSA Next Gen by PCR, Nasal     Status: None   Collection Time: 11/15/23  9:46 PM   Specimen: Nasal Mucosa; Nasal Swab  Result Value Ref Range Status   MRSA by PCR Next Gen NOT DETECTED NOT DETECTED Final    Comment: (NOTE) The GeneXpert MRSA Assay (FDA approved for NASAL specimens only), is one component of a comprehensive MRSA colonization surveillance program. It is not intended to diagnose MRSA infection nor to guide or monitor treatment for MRSA infections. Test performance is not FDA approved in patients less than 35 years old. Performed at [facility], [address] [place], [place] [address]    *Note: Due to a large number of results and/or encounters for the requested time period, some results have not been displayed. A complete set of results can be found in Results Review.    Labs: CBC: Recent Labs  Lab 11/19/23 1654 11/20/23 0523 11/21/23 0612 11/24/23 0540 11/26/23 0446  WBC 3.6* 5.3 5.2 4.8 5.5  HGB 9.4* 8.4* 9.6* 8.4* 8.7*  HCT 28.8* 26.3* 29.7* 25.7* 27.1*  MCV 98.0 98.5 98.0 97.7 98.5  PLT 256 247 249 281 291   Basic Metabolic  Panel: Recent Labs  Lab 11/20/23 0523 11/21/23 0612 11/23/23 0528 11/24/23 0540 11/26/23 0446  NA 137 136 136 138 138  K 4.2 4.5 4.3 4.1 4.8  CL 105 104 105 104 105  CO2 28 26 27 29 28   GLUCOSE 54* 124* 122* 71 57*  BUN 23 29* 34* 30* 26*  CREATININE 1.25* 1.06 1.26* 1.13 1.13  CALCIUM  8.4* 8.2* 8.2* 8.3* 8.4*   Liver Function Tests: No results for input(s): AST, ALT, ALKPHOS, BILITOT, PROT, ALBUMIN in the last 168 hours. CBG: Recent Labs  Lab 11/26/23 0051 11/26/23 0438 11/26/23 0506 11/26/23 0541 11/26/23 0744  GLUCAP 86 55* 51* 83 194*    Discharge time spent: greater than 30 minutes.  This record has been created using Conservation officer, historic buildings. Errors have been sought and corrected,but may not always be located. Such creation errors do not reflect on the standard of care.   Signed: Amaryllis Dare, MD [facility] 11/26/2023

## 2023-11-26 NOTE — Plan of Care (Signed)
  Problem: Education: Goal: Knowledge of disease or condition will improve Outcome: Progressing Goal: Knowledge of patient specific risk factors will improve (DELETE if not current risk factor) Outcome: Progressing   Problem: Ischemic Stroke/TIA Tissue Perfusion: Goal: Complications of ischemic stroke/TIA will be minimized Outcome: Progressing   Problem: Self-Care: Goal: Verbalization of feelings and concerns over difficulty with self-care will improve Outcome: Progressing Goal: Ability to communicate needs accurately will improve Outcome: Progressing   Problem: Nutrition: Goal: Risk of aspiration will decrease Outcome: Progressing   Problem: Coping: Goal: Ability to adjust to condition or change in health will improve Outcome: Progressing   Problem: Skin Integrity: Goal: Risk for impaired skin integrity will decrease Outcome: Progressing   Problem: Tissue Perfusion: Goal: Adequacy of tissue perfusion will improve Outcome: Progressing   Problem: Clinical Measurements: Goal: Will remain free from infection Outcome: Progressing

## 2023-11-26 NOTE — TOC Progression Note (Addendum)
 Transition of Care [facility]) - Progression Note    Patient Details  Name: TYCEN DOCKTER MRN: [id number] Date of Birth: October 11, 1950  [facility] CM/SW Contact  Dalia GORMAN Fuse, RN Phone Number: 11/26/2023, 8:55 AM  Clinical Narrative:     [employer] approved 11/26/23-11/30/23 plan auth id #[id number] auth id # [id number]  TOC sent message to Kia at [facility] with auth.                     Expected Discharge Plan and Services                                               Social Drivers of Health (SDOH) Interventions SDOH Screenings   Food Insecurity: No Food Insecurity (11/16/2023)  Housing: Unknown (11/16/2023)  Transportation Needs: No Transportation Needs (11/16/2023)  Utilities: Not At Risk (11/16/2023)  Alcohol Screen: Low Risk  (10/02/2020)  Depression (PHQ2-9): Low Risk  (11/10/2023)  Financial Resource Strain: Low Risk  (10/20/2023)   Received from [facility]  Physical Activity: Insufficiently Active (01/01/2020)  Social Connections: Moderately Isolated (11/16/2023)  Stress: No Stress Concern Present (01/01/2020)  Tobacco Use: Low Risk  (11/15/2023)  Recent Concern: Tobacco Use - Medium Risk (10/27/2023)   [facility] from [facility]    Readmission Risk Interventions    02/16/2023    3:56 PM 08/24/2022   10:57 AM 05/20/2022   11:41 AM  Readmission Risk Prevention Plan  Transportation Screening Complete Complete Complete  Medication Review Oceanographer) Complete Complete Complete  PCP or Specialist appointment within 3-5 days of discharge Complete Complete Complete  HRI or Home Care Consult Complete Complete Complete  SW Recovery Care/Counseling Consult Not Complete    Palliative Care Screening Not Applicable  Complete  Skilled Nursing Facility Complete Complete Complete

## 2023-11-26 NOTE — Plan of Care (Signed)
 PMT shadowing Notes and labs reviewed.  Working towards rehab.  Called to speak with son Nicholas Weiss who is listed in demographics.  He states his family makes decisions together and to speak with Nicholas Weiss regarding decisions.  He confirms as far as he is aware his father would want full code and full scope. PMT will sign off as goals are set. Please reconsult if needs arise.

## 2023-11-26 NOTE — Progress Notes (Signed)
 Occupational Therapy Treatment Patient Details Name: Nicholas Weiss MRN: [id number] DOB: April 27, 1950 Today's Date: 11/26/2023   History of present illness 73 y.o. male present to hospitalafter he lost consciousness and fell on the ground.  Patient was recently admitted to the hospital between 11/11/2023 to 11/13/2023 after presenting a fall.  As noted above he had a similar incident with the fall in June 2025 and had a subdural hematoma and a fractured left upper extremity.   OT comments  Mr. Nong was seen for OT treatment on this date. Upon arrival to room pt supine in bed, agreeable to OT Tx session. OT facilitated ADL management with education and assistance as described below. See ADL section for additional details regarding occupational performance. Pt continues to be functionally limited by decreased functional use of his LUE, generalized weakness, decreased activity tolerance, and poor cognition. He remains pleasantly confused t/o session, but eager to participate in session when discussing sports with therapist. Pt is progressing toward OT goals and continues to benefit from skilled OT services to maximize return to PLOF and minimize risk of future falls, injury, and readmission. Will continue to follow POC as written. Discharge recommendation remains appropriate.        If plan is discharge home, recommend the following:  A lot of help with bathing/dressing/bathroom;Assistance with cooking/housework;Assistance with feeding;Direct supervision/assist for medications management;Direct supervision/assist for financial management;Assist for transportation;Help with stairs or ramp for entrance;A lot of help with walking and/or transfers   Equipment Recommendations  Other (comment) (defer)    Recommendations for Other Services      Precautions / Restrictions Precautions Precautions: Fall Recall of Precautions/Restrictions: Impaired Precaution/Restrictions Comments: Assumed NWB on LUE d/t L  humerus fx-grimaces in pain to touch; orthostatic Required Braces or Orthoses: Sling Restrictions Weight Bearing Restrictions Per Provider Order: No       Mobility Bed Mobility Overal bed mobility: Needs Assistance Bed Mobility: Supine to Sit, Sit to Supine     Supine to sit: Max assist Sit to supine: Max assist        Transfers Overall transfer level: Needs assistance Equipment used: 1 person hand held assist Transfers: Sit to/from Stand, Bed to chair/wheelchair/BSC Sit to Stand: Max assist, Mod assist           General transfer comment: Puts forth good effort, denies adverse s/s during standing activity.     Balance Overall balance assessment: Needs assistance Sitting-balance support: Feet supported, Single extremity supported Sitting balance-Leahy Scale: Good Sitting balance - Comments: steady static sitting, reaching inside BOS.   Standing balance support: Single extremity supported, During functional activity, Reliant on assistive device for balance Standing balance-Leahy Scale: Poor Standing balance comment: requires at least MOD A for static standing balance. Heavy posterior lean.                           ADL either performed or assessed with clinical judgement   ADL Overall ADL's : Needs assistance/impaired;At baseline                                       General ADL Comments: MAX A for bed mobility, MAX A +1 for STS and to take 2 side steps toward [facility] with significant posterior lean (supported on R side by therapist), Able to maintain static sitting balance at EOB for UE ther-ex for ~10 minutes. Denies adverse s/s  while seated EOB. Eager to participate today, although he remains pleasantly confused t/o session. Eager to discuss softball and basketball and motivated to do exercises by relating movements to sports activities.    Extremity/Trunk Assessment              Psychologist, educational Communication Communication: Impaired Factors Affecting Communication: Hearing impaired   Cognition Arousal: Alert Behavior During Therapy: WFL for tasks assessed/performed Cognition: History of cognitive impairments                               Following commands: Impaired Following commands impaired: Follows one step commands with increased time      Cueing   Cueing Techniques: Verbal cues, Tactile cues, Gestural cues  Exercises Hand Exercises Wrist Extension: AROM, Left, Seated, 5 reps Digit Composite Flexion: AROM, Left, 5 reps, Seated Composite Extension: AROM, Left, 5 reps, Seated    Shoulder Instructions       General Comments      Pertinent Vitals/ Pain       Pain Assessment Pain Assessment: Faces Faces Pain Scale: Hurts little more Pain Location: L shoulder with activity Pain Descriptors / Indicators: Sore, Guarding, Grimacing Pain Intervention(s): Limited activity within patient's tolerance, Monitored during session, Repositioned  Home Living                                          Prior Functioning/Environment              Frequency  Min 2X/week        Progress Toward Goals  OT Goals(current goals can now be found in the care plan section)  Progress towards OT goals: Progressing toward goals  Acute Rehab OT Goals Patient Stated Goal: to get back to playing softball OT Goal Formulation: With patient/family Time For Goal Achievement: 11/30/23 Potential to Achieve Goals: Fair  Plan      Co-evaluation                 AM-PAC OT 6 Clicks Daily Activity     Outcome Measure   Help from another person eating meals?: A Little Help from another person taking care of personal grooming?: A Little Help from another person toileting, which includes using toliet, bedpan, or urinal?: A Lot Help from another person bathing (including washing, rinsing, drying)?: A Lot Help from another person to  put on and taking off regular upper body clothing?: A Little Help from another person to put on and taking off regular lower body clothing?: A Lot 6 Click Score: 15    End of Session Equipment Utilized During Treatment: Gait belt  OT Visit Diagnosis: Unsteadiness on feet (R26.81);Other symptoms and signs involving cognitive function;Muscle weakness (generalized) (M62.81);History of falling (Z91.81);Repeated falls (R29.6)   Activity Tolerance Patient tolerated treatment well   Patient Left with call bell/phone within reach;in bed;with bed alarm set   Nurse Communication Mobility status        Time: 8996-8965 OT Time Calculation (min): 31 min  Charges: OT General Charges $OT Visit: 1 Visit OT Treatments $Therapeutic Activity: 8-22 mins $Therapeutic Exercise: 8-22 mins  Jhonny Pelton, M.S., OTR/L 11/26/23, 12:28 PM

## 2023-12-01 ENCOUNTER — Encounter: Payer: Self-pay | Admitting: Oncology

## 2023-12-01 ENCOUNTER — Other Ambulatory Visit: Payer: Self-pay | Admitting: Oncology

## 2023-12-01 DIAGNOSIS — C9000 Multiple myeloma not having achieved remission: Secondary | ICD-10-CM

## 2023-12-01 NOTE — Telephone Encounter (Signed)
 Representative indicated it is too soon to obtain [employer] #; last authorization obtained 10/19/23 and medication was delivered 11/11/23.  Tomorrow is the soonest date to obtain new [employer] auth.

## 2023-12-02 ENCOUNTER — Encounter: Payer: Self-pay | Admitting: Oncology

## 2023-12-04 ENCOUNTER — Other Ambulatory Visit: Payer: Self-pay

## 2023-12-07 ENCOUNTER — Other Ambulatory Visit: Payer: Self-pay

## 2023-12-07 DIAGNOSIS — C9000 Multiple myeloma not having achieved remission: Secondary | ICD-10-CM

## 2023-12-08 ENCOUNTER — Other Ambulatory Visit: Payer: Self-pay

## 2023-12-08 ENCOUNTER — Inpatient Hospital Stay (HOSPITAL_BASED_OUTPATIENT_CLINIC_OR_DEPARTMENT_OTHER): Admitting: Oncology

## 2023-12-08 ENCOUNTER — Other Ambulatory Visit: Payer: Self-pay | Admitting: Student

## 2023-12-08 ENCOUNTER — Ambulatory Visit
Admission: RE | Admit: 2023-12-08 | Discharge: 2023-12-08 | Disposition: A | Discharge status: Home / Self Care | Source: Ambulatory Visit | Attending: Student | Admitting: Student

## 2023-12-08 ENCOUNTER — Encounter: Payer: Self-pay | Admitting: Oncology

## 2023-12-08 ENCOUNTER — Inpatient Hospital Stay: Attending: Oncology

## 2023-12-08 ENCOUNTER — Inpatient Hospital Stay

## 2023-12-08 VITALS — BP 97/74 | HR 86 | Temp 94.8°F | Resp 18 | Ht 70.0 in

## 2023-12-08 DIAGNOSIS — D631 Anemia in chronic kidney disease: Secondary | ICD-10-CM | POA: Diagnosis not present

## 2023-12-08 DIAGNOSIS — F4489 Other dissociative and conversion disorders: Secondary | ICD-10-CM

## 2023-12-08 DIAGNOSIS — Z79899 Other long term (current) drug therapy: Secondary | ICD-10-CM | POA: Insufficient documentation

## 2023-12-08 DIAGNOSIS — Z79624 Long term (current) use of inhibitors of nucleotide synthesis: Secondary | ICD-10-CM | POA: Insufficient documentation

## 2023-12-08 DIAGNOSIS — C9000 Multiple myeloma not having achieved remission: Secondary | ICD-10-CM | POA: Diagnosis not present

## 2023-12-08 DIAGNOSIS — Z7961 Long term (current) use of immunomodulator: Secondary | ICD-10-CM | POA: Diagnosis not present

## 2023-12-08 DIAGNOSIS — N1831 Chronic kidney disease, stage 3a: Secondary | ICD-10-CM | POA: Diagnosis present

## 2023-12-08 DIAGNOSIS — R296 Repeated falls: Secondary | ICD-10-CM

## 2023-12-08 NOTE — Progress Notes (Signed)
 Hematology/Oncology Consult note [facility]  [phone] [phone]  Patient Care Team: Alla Amis, MD as PCP - General (Family Medicine) Pa, [facility] (Optometry) Melanee Annah BROCKS, MD as Consulting Physician (Oncology)   Name of the patient: Nicholas Weiss  [id number]  01/25/1951   Date of visit: 12/08/23  Diagnosis- high risk IgG lambda multiple myeloma R-ISS stage III     Chief complaint/ Reason for visit-posthospital discharge follow-up  Heme/Onc history:  Patient is a 73 year old African-American male with a past medical history significant for uncontrolled type 1 diabetes, chronic kidney disease stage III chronic normocytic anemia referred for abnormal SPEP. Patient's creatinine has been fluctuating between 1.5-2 but about 5 months ago it went up all the way to 5 and his blood sugars were in the 1000 range. More recently his kidney numbers have been drifting back to normal values. As a part of the work-up he had serum protein electrophoresis done which showed an elevated gammaglobulin fraction with an M spike of 3%. The amount of M spike was not quantified in the specimen. He has therefore been referred to us  for further management. Patient endorses chronic fatigue reports that his appetite and weight have remained stable   Results of blood work from 04/08/2020 were as follows: CBC showed white count of 2.9, H&H of 10.5/31.2 with an MCV of 91 and a platelet count of 191.  CMP showed a mildly elevated creatinine of 1.2 which was better as compared to 5 months ago when it was 3.9.  Total protein was mildly elevated at 9.1 calcium  normal at 8.4 ferritin and iron  studies B12 folate TSH and haptoglobin were normal.  Myeloma panel revealed an elevated IgG level of 06/04/2004 with an M protein of 3.1 g.  Immunofixation showed IgG lambda specificity.  Serum free light chain ratio was elevated at 25 and free light chain lambda elevated at  370   Further myeloma work-up including a PET CT scan did not reveal any evidence of edematous lesions.  Bone marrow biopsy showed 23% plasma cells by manual count and 30% by CD138 IHC.  Normal cytogenetics.  FISH studies for myeloma showed gain of 1 q.  13 q-. detected.  P53 not detected.   He was treated for a year and has having smoldering multiple myeloma since both CKD and anemia have been stable for 3 to 4 years and could be secondary to uncontrolled diabetes.In June 2023 IgG levels increased to 5463 with an M protein of 4.1 g as compared to 2.6 g in September 2022.  Serum free light chain ratio remains around 25.  Therefore a repeat bone marrow biopsy was done which shows further increase in plasma cells ranging from 30 to 70% and by immunohistochemistry 60 to 70% of the cells were positive for CD138.  Cytogenetics normal.  FISH study showed 4: 14 translocation and gain of 1 q. making this high risk RISS stage III.  Repeat PET scan showed no lytic lesions   Patient is not a transplant candidate and was started on Velcade  Revlimid  dexamethasone  regimen in July 2023.  He was also referred to [facility] for second opinion but did not go.  Treatment complicated by repeated hospitalizations for uncontrolled blood sugars.  He has been frequently hospitalized thereby causing multiple interruptions in his treatment. Due to lack of adequate response to Vrd darzalex  was added in may 2024. M protein remained around 2 g despite adding Darzalex  to the regimen and therefore plan  to switch to Pomalyst  plus Darzalex  and stop Velcade  and Revlimid  altogether starting August 2024  Interval history-patient was admitted to the hospital in in August 2025 following a fallAnd was found to have intraventricular bleed which was managed conservatively.  He was seen by neurology as an outpatient yesterday and plans to get a repeat CT head soon.  He had another fall 3 days ago.  Patient appears quite fatigued today and does not  verbalize much  ECOG PS- 4   Review of systems- Review of Systems  Unable to perform ROS: Other    Patient does not verbalize much today  Allergies  Allergen Reactions   Penicillins Other (See Comments)    Childhood allergy -tolerated amoxil  03/2022 Unknown reaction Has patient had a PCN reaction causing immediate rash, facial/tongue/throat swelling, SOB or lightheadedness with hypotension: No Has patient had a PCN reaction causing severe rash involving mucus membranes or skin necrosis: No Has patient had a PCN reaction that required hospitalization No Has patient had a PCN reaction occurring within the last 10 years: No If all of the above answers are NO, then may proceed with Cephalosporin use.      Past Medical History:  Diagnosis Date   Acute metabolic encephalopathy 12/02/2020   DKA (diabetic ketoacidoses) 04/06/2016   Hypercholesteremia    Hypertension      Past Surgical History:  Procedure Laterality Date   AMPUTATION TOE Left 04/25/2022   Procedure: LEFT GREAT TOE AMPUTATION AND LEFT PARTIAL 2ND TOE AMPUTATION;  Surgeon: Janit Thresa HERO, DPM;  Location: [facility] ORS;  Service: Podiatry;  Laterality: Left;   COLONOSCOPY WITH PROPOFOL  N/A 02/01/2020   Procedure: COLONOSCOPY WITH PROPOFOL ;  Surgeon: Unk Corinn Skiff, MD;  Location: [facility] ENDOSCOPY;  Service: Gastroenterology;  Laterality: N/A;   ESOPHAGOGASTRODUODENOSCOPY  02/01/2020   Procedure: ESOPHAGOGASTRODUODENOSCOPY (EGD);  Surgeon: Unk Corinn Skiff, MD;  Location: [facility] ENDOSCOPY;  Service: Gastroenterology;;   ESOPHAGOGASTRODUODENOSCOPY (EGD) WITH PROPOFOL  N/A 06/07/2022   Procedure: ESOPHAGOGASTRODUODENOSCOPY (EGD) WITH PROPOFOL ;  Surgeon: Saintclair Jasper, MD;  Location: [facility] ENDOSCOPY;  Service: Gastroenterology;  Laterality: N/A;   KNEE SURGERY Right    Torn meniscus   KNEE SURGERY Left     Social History   Socioeconomic History   Marital status: Single    Spouse name: Not on file   Number of children: 3    Years of education: Not on file   Highest education level: High school graduate  Occupational History    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  Tobacco Use   Smoking status: Never   Smokeless tobacco: Never  Vaping Use   Vaping status: Never Used  Substance and Sexual Activity   Alcohol use: Not Currently    Alcohol/week: 0.0 - 1.0 standard drinks of alcohol    Comment: once every 2 months   Drug use: Yes    Types: Marijuana, Crack cocaine    Comment: last week   Sexual activity: Not Currently    Birth control/protection: None  Other Topics Concern   Not on file  Social History Narrative   Lives with girlfriend sharon   Social Drivers of Health   Financial Resource Strain: Low Risk  (10/20/2023)   Received from [facility]   Overall Financial Resource Strain (CARDIA)    Difficulty of Paying Living Expenses: Not hard at all  Food Insecurity: No Food Insecurity (11/16/2023)   Hunger Vital Sign    Worried About Running Out of Food in the Last Year: Never true  Ran Out of Food in the Last Year: Never true  Transportation Needs: No Transportation Needs (11/16/2023)   PRAPARE - Administrator, Civil Service (Medical): No    Lack of Transportation (Non-Medical): No  Physical Activity: Insufficiently Active (01/01/2020)   Exercise Vital Sign    Days of Exercise per Week: 7 days    Minutes of Exercise per Session: 20 min  Stress: No Stress Concern Present (01/01/2020)   [employer] of Occupational Health - Occupational Stress Questionnaire    Feeling of Stress : Not at all  Social Connections: Moderately Isolated (11/16/2023)   Social Connection and Isolation Panel    Frequency of Communication with Friends and Family: More than three times a week    Frequency of Social Gatherings with Friends and Family: More than three times a week    Attends Religious Services: Never    Database administrator or Organizations: No    Attends Tax inspector Meetings: Never    Marital Status: Living with partner  Intimate Partner Violence: Not At Risk (11/16/2023)   Humiliation, Afraid, Rape, and Kick questionnaire    Fear of Current or Ex-Partner: No    Emotionally Abused: No    Physically Abused: No    Sexually Abused: No    Family History  Problem Relation Age of Onset   Heart attack Father    Hypertension Sister    Cancer Sister      Current Outpatient Medications:    acetaminophen  (TYLENOL ) 325 MG tablet, Take 2 tablets (650 mg total) by mouth every 6 (six) hours as needed for mild pain (pain score 1-3) or fever., Disp: , Rfl:    acyclovir  (ZOVIRAX ) 400 MG tablet, Take 400 mg by mouth in the morning and at bedtime., Disp: , Rfl:    atorvastatin  (LIPITOR ) 80 MG tablet, Take 1 tablet (80 mg total) by mouth at bedtime. Hold while taking Paxlovid , Disp: 30 tablet, Rfl: 2   Continuous Glucose Sensor (DEXCOM G7 SENSOR) MISC, 1 each by Does not apply route 4 (four) times daily -  before meals and at bedtime., Disp: , Rfl:    Fe Fum-Vit C-Vit B12-FA (TRIGELS-F FORTE) CAPS capsule, Take 1 capsule by mouth 2 (two) times daily., Disp: , Rfl:    gabapentin  (NEURONTIN ) 300 MG capsule, Take 1 capsule (300 mg total) by mouth 2 (two) times daily., Disp: 60 capsule, Rfl: 0   insulin  lispro (HUMALOG) 100 UNIT/ML injection, INJECT 150 UNITS EVERY 3 DAYS VIA INSULIN  PUMP AS DIRECTED, Disp: , Rfl:    iron  polysaccharides (NIFEREX) 150 MG capsule, Take 150 mg by mouth., Disp: , Rfl:    latanoprost  (XALATAN ) 0.005 % ophthalmic solution, 1 drop at bedtime., Disp: , Rfl:    levETIRAcetam  (KEPPRA ) 500 MG tablet, Take 1 tablet by mouth twice daily, Disp: 60 tablet, Rfl: 0   midodrine  (PROAMATINE ) 2.5 MG tablet, Take 1 tablet (2.5 mg total) by mouth 3 (three) times daily as needed (For systolic blood pressure less than 100)., Disp: , Rfl:    Multiple Vitamin (MULTIVITAMIN WITH MINERALS) TABS tablet, Take 1 tablet by mouth daily., Disp: , Rfl:     oxyCODONE -acetaminophen  (PERCOCET/ROXICET) 5-325 MG tablet, Take 1 tablet by mouth every 6 (six) hours as needed for severe pain (pain score 7-10)., Disp: 30 tablet, Rfl: 0   polyethylene glycol (MIRALAX  / GLYCOLAX ) 17 g packet, Take 17 g by mouth daily as needed for mild constipation., Disp: , Rfl:    POMALYST  2  MG capsule, TAKE 1 CAPSULE BY MOUTH EVERY DAY FOR 21 DAYS, THEN HOLD FOR 7 DAYS. REPEAT EVERY 28 DAYS, Disp: 21 capsule, Rfl: 0   risperiDONE  (RISPERDAL ) 0.5 MG tablet, Take 1 tablet (0.5 mg total) by mouth every 8 (eight) hours as needed (agitation)., Disp: , Rfl:    fludrocortisone  (FLORINEF ) 0.1 MG tablet, Take 1 tablet (0.1 mg total) by mouth daily as needed. Skip the dose if SBP >130 (Patient taking differently: Take 0.1 mg by mouth daily. Skip the dose if SBP >130), Disp: , Rfl:  No current facility-administered medications for this visit.  Facility-Administered Medications Ordered in Other Visits:    epoetin  alfa-epbx (RETACRIT ) injection 40,000 Units, 40,000 Units, Subcutaneous, Q21 days, Melanee Annah BROCKS, MD, 40,000 Units at 07/09/23 1153  Physical exam:  Vitals:   12/08/23 0951  BP: 97/74  Pulse: 86  Resp: 18  Temp: (!) 94.8 F (34.9 C)  TempSrc: Tympanic  SpO2: 100%  Height: 5' 10 (1.778 m)   Physical Exam Constitutional:      Comments: Sitting in a wheelchair.  Appears in no acute respiratory distress.  Cardiovascular:     Rate and Rhythm: Normal rate and regular rhythm.     Heart sounds: Normal heart sounds.  Pulmonary:     Effort: Pulmonary effort is normal.     Breath sounds: Normal breath sounds.  Abdominal:     General: Bowel sounds are normal.     Palpations: Abdomen is soft.  Skin:    General: Skin is warm and dry.  Neurological:     General: No focal deficit present.     Mental Status: He is alert.      I have personally reviewed labs listed below:    Latest Ref Rng & Units 11/26/2023    4:46 AM  CMP  Glucose 70 - 99 mg/dL 57   BUN 8 - 23  mg/dL 26   Creatinine 9.38 - 1.24 mg/dL 8.86   Sodium 864 - 854 mmol/L 138   Potassium 3.5 - 5.1 mmol/L 4.8   Chloride 98 - 111 mmol/L 105   CO2 22 - 32 mmol/L 28   Calcium  8.9 - 10.3 mg/dL 8.4       Latest Ref Rng & Units 11/26/2023    4:46 AM  CBC  WBC 4.0 - 10.5 K/uL 5.5   Hemoglobin 13.0 - 17.0 g/dL 8.7   Hematocrit 60.9 - 52.0 % 27.1   Platelets 150 - 400 K/uL 291    I have personally reviewed Radiology images listed below: No images are attached to the encounter.  CT HEAD WO CONTRAST ( ) Result Date: 12/08/2023 CLINICAL DATA:  73 year old male code stroke presentation last month. Frequent falls. Confusion. EXAM: CT HEAD WITHOUT CONTRAST TECHNIQUE: Contiguous axial images were obtained from the base of the skull through the vertex without intravenous contrast. RADIATION DOSE REDUCTION: This exam was performed according to the departmental dose-optimization program which includes automated exposure control, adjustment of the mA and/or kV according to patient size and/or use of iterative reconstruction technique. COMPARISON:  Brain MRI 11/16/2023.  Head CT 11/20/2023 and earlier. FINDINGS: Study is intermittently degraded by motion artifact despite repeated imaging attempts. Brain: Detail limited by motion. Largely resolved right lateral intraventricular hemorrhage since 11/20/2023, small volume residual visible near the right foramen of Monro series 6, image 14. No interval ventriculomegaly. No transependymal edema. No new or progressive intracranial hemorrhage is identified. Incidental dural calcifications. No midline shift. Basilar cisterns appear stable and normal on  series 9, image 37. Grossly stable gray-white differentiation. Vascular: Detail limited by motion. Skull: Detail limited by motion, grossly stable and intact. Sinuses/Orbits: Visualized paranasal sinuses and mastoids are stable and well aerated. Other: Orbit and scalp detail limited by motion, grossly stable since last  month. IMPRESSION: 1. Motion degraded exam despite repeated imaging attempts. 2. Largely resolved intraventricular hemorrhage since 11/20/2023. 3. No new intracranial abnormality is identified. Electronically Signed   By: VEAR Hurst M.D.   On: 12/08/2023 12:40   DG Shoulder Left Result Date: 11/23/2023 EXAM: 1 VIEW XRAY OF THE LEFT SHOULDER 11/23/2023 04:27:00 PM COMPARISON: Shoulder and humerus radiographs 09/23/23. CLINICAL HISTORY: Fracture.  Per sitter patient has had a recent fall. FINDINGS: BONES AND JOINTS: Increased displacement of comminuted displaced fracture of the proximal humerus involving the surgical neck and proximal metadiaphysis with slight apex lateral angulation. Some incomplete peripheral callus formation since prior exam. The fracture line remains readily visible. Mild degenerative changes of the glenohumeral and acromioclavicular joints. SOFT TISSUES: Subcutaneous soft tissue edema. Visualized lung is unremarkable. IMPRESSION: 1. Increased displacement of comminuted proximal humerus fracture since June. This may represent delayed fracture healing or re-fracture at the site of previous proximal humeral fracture. Electronically signed by: Andrea Gasman MD 11/23/2023 04:50 PM EDT RP Workstation: [id number]   CT HEAD CODE STROKE WO CONTRAST` Result Date: 11/20/2023 CLINICAL DATA:  Provided history: Neuro deficit, acute, stroke suspected. EXAM: CT HEAD WITHOUT CONTRAST TECHNIQUE: Contiguous axial images were obtained from the base of the skull through the vertex without intravenous contrast. RADIATION DOSE REDUCTION: This exam was performed according to the departmental dose-optimization program which includes automated exposure control, adjustment of the mA and/or kV according to patient size and/or use of iterative reconstruction technique. COMPARISON:  Head CT 11/19/2023. FINDINGS: Brain: Generalized cerebral atrophy. A focus of hemorrhage within the body of the right lateral ventricle is  unchanged as compared to yesterday's head CT, again measuring 1.7 x 1.3 cm in transaxial dimensions. Small-volume hemorrhage within the occipital horns of both lateral ventricles, also unchanged. Stable size and configuration of the ventricular system. No evidence of an interval acute intracranial hemorrhage. Patchy and ill-defined hypoattenuation within the cerebral white matter, nonspecific but compatible with moderate chronic small vessel ischemic disease. Prominent dural calcifications. No demarcated cortical infarct. No extra-axial fluid collection. No midline shift. Vascular: No hyperdense vessel.  Atherosclerotic calcifications. Skull: No calvarial fracture or aggressive osseous lesion. Sinuses/Orbits: No mass or acute finding within the imaged orbits. Minimal mucosal thickening within the right maxillary sinus at the imaged levels. Tiny mucous retention cyst within the left maxillary sinus at the imaged levels. No evidence of an interval acute intracranially as compared yesterday's head CT. These results were communicated to Dr. Voncile at 3:48 pmon 8/23/2025by text page via the [facility] messaging system. IMPRESSION: 1. 1.7 x 1.3 cm focus of hemorrhage within the body of the right lateral ventricle, unchanged from yesterday's head CT. 2. Small-volume hemorrhage within the occipital horns of both lateral ventricles, also unchanged. 3. Stable size and configuration of the ventricular system. 4. No evidence of an interval acute intracranial hemorrhage. 5. Moderate chronic small vessel ischemic changes within the cerebral white matter. 6. Generalized cerebral atrophy Electronically Signed   By: Rockey Childs D.O.   On: 11/20/2023 15:48   CT HEAD WO CONTRAST ( ) Result Date: 11/19/2023 CLINICAL DATA:  Mental status change. Follow-up intracranial hemorrhage EXAM: CT HEAD WITHOUT CONTRAST TECHNIQUE: Contiguous axial images were obtained from the base of the skull through the  vertex without intravenous contrast.  RADIATION DOSE REDUCTION: This exam was performed according to the departmental dose-optimization program which includes automated exposure control, adjustment of the mA and/or kV according to patient size and/or use of iterative reconstruction technique. COMPARISON:  11/17/2023.  Older studies as distant as 11/11/2023. FINDINGS: Brain: No focal abnormality affects the brainstem or cerebellum. Focal clot within the right lateral ventricle persists and is slightly smaller, measuring 13 x 17 mm today. Some layering blood dependent in the atria and occipital horns of the lateral ventricles, less by volume. Ventricular size is stable without developing hydrocephalus. No intraparenchymal hemorrhage. No subdural hematoma. Mild chronic small-vessel ischemic changes affect the cerebral hemispheric white matter. Patient has a tendency towards benign dural calcification. Vascular: There is atherosclerotic calcification of the major vessels at the base of the brain. Skull: Negative Sinuses/Orbits: Clear/normal Other: None IMPRESSION: Focal clot within the right lateral ventricle persists and is slightly smaller, measuring 13 x 17 mm today. Some layering blood dependent in the atria and occipital horns of the lateral ventricles, less by volume. Ventricular size is stable without developing hydrocephalus. No new or worsening intracranial hemorrhage. Electronically Signed   By: Oneil Officer M.D.   On: 11/19/2023 16:42   CT HEAD WO CONTRAST ( ) Result Date: 11/17/2023 CLINICAL DATA:  Provided history: Stroke, hemorrhagic. Intraventricular hemorrhage. EXAM: CT HEAD WITHOUT CONTRAST TECHNIQUE: Contiguous axial images were obtained from the base of the skull through the vertex without intravenous contrast. RADIATION DOSE REDUCTION: This exam was performed according to the departmental dose-optimization program which includes automated exposure control, adjustment of the mA and/or kV according to patient size and/or use of  iterative reconstruction technique. COMPARISON:  Non-contrast head CT and CT angiogram head/neck 11/16/2023. FINDINGS: Brain: Generalized cerebral atrophy. The focus of hemorrhage within the body of the right lateral ventricle has not significantly changed, again measuring 2.3 x 1.2 cm. Layering hemorrhage within the occipital horns of both lateral ventricles, slightly increased. Unchanged size and configuration of the ventricular system. Patchy and ill-defined hypoattenuation within the cerebral white matter, nonspecific but compatible with moderate chronic small vessel ischemic disease. No demarcated cortical infarct. No extra-axial fluid collection. No evidence of an intracranial mass. No midline shift. Vascular: No hyperdense vessel.  Atherosclerotic calcifications. Skull: No calvarial fracture or aggressive osseous lesion. Sinuses/Orbits: No mass or acute finding within the imaged orbits. Trace mucosal thickening within the left maxillary sinus at the imaged levels. Impression #2 will be called to the ordering clinician or representative by the Radiologist Assistant, and communication documented in the PACS or [employer]. IMPRESSION: 1. Unchanged focus of hemorrhage within the body of the right lateral ventricle, again measuring 2.3 x 1.2 cm. 2. Layering hemorrhage within the occipital horns of both lateral ventricles, slightly increased. 3. Unchanged size and configuration of the ventricular system. 4. Moderate chronic small vessel ischemic changes within the cerebral white matter. 5. Generalized cerebral atrophy. Electronically Signed   By: Rockey Childs D.O.   On: 11/17/2023 10:52   CT ANGIO HEAD NECK W WO CM Result Date: 11/16/2023 EXAM: CTA HEAD AND NECK WITHOUT AND WITH 11/16/2023 03:43:50 PM TECHNIQUE: CTA of the head and neck was performed without and with the administration of 75 mL of intravenous iohexol  (OMNIPAQUE ) 350 MG/ML contrast. Multiplanar 2D and/or 3D reformatted images are provided  for review. Automated exposure control, iterative reconstruction, and/or weight based adjustment of the mA/kV was utilized to reduce the radiation dose to as low as reasonably achievable. Stenosis of the internal  carotid arteries measured using NASCET criteria. COMPARISON: Same day MRI head and CT head 11/15/23 and CTA head and neck 02/15/23. CLINICAL HISTORY: Stroke, hemorrhagic; Isolated IVH, likely traumatic due to fall and coagulopathy from Eliquis , evaluate for vascular malformation. FINDINGS: CTA NECK: AORTIC ARCH AND ARCH VESSELS: Mild atherosclerosis of the visualized aortic arch. No dissection or arterial injury. No significant stenosis of the brachiocephalic or subclavian arteries. CERVICAL CAROTID ARTERIES: Calcified atherosclerosis at the right carotid bifurcation resulting in approximately 50% stenosis of the proximal right cervical ICA, similar to prior. Additional calcified atherosclerosis at the left carotid bifurcation resulting in approximately 50% stenosis of the proximal left cervical ICA, similar to prior. No dissection or arterial injury. CERVICAL VERTEBRAL ARTERIES: The vertebral arteries are patent from the origins to the vertebrobasilar confluence. Severe stenosis at the right vertebral artery origin. No dissection or arterial injury. LUNGS AND MEDIASTINUM: Unremarkable. SOFT TISSUES: No acute abnormality. BONES: Degenerative changes in the spine with disc space narrowing greatest at C5-6 and C6-7. No acute abnormality. CTA HEAD: ANTERIOR CIRCULATION: The intracranial internal carotid arteries appear patent to the ICA termini. There is atherosclerosis of the carotid siphons without evidence of high-grade stenosis. The MCA's are patent proximally. There is limited evaluation of distal MCA branches due to motion artifact. The right A1 segment is hypoplastic. ACA are otherwise patent proximally. Distal ACA branch is not well visualized. No aneurysm. POSTERIOR CIRCULATION: The vertebral artery is  patent. Posterior cerebral arteries are patent bilaterally with limited evaluation of the distal branches. No significant stenosis of the basilar artery. No aneurysm. OTHER: No dural venous sinus thrombosis on this non-dedicated study. Focus of intracranial hemorrhage within the body of the right lateral ventricle appears slightly increased compared to the recent prior CT. Trace layering blood products in the occipital horns noted. No midline shift. Nonspecific hypoattenuation in the periventricular and subcortical white matter, most likely representing chronic small vessel disease. Evaluation of intracranial arterial vasculature is limited due to motion artifact. IMPRESSION: 1. Examination limited by motion artifact. Within this limitation, no evidence of vascular malformation. 2. Calcified atherosclerosis at the carotid bifurcations resulting in approximately 50% stenosis of the proximal cervical ICAs, similar to prior. 3. Severe stenosis at the right vertebral artery origin. 4. Mild atherosclerosis of the visualized aortic arch. Electronically signed by: Donnice Mania MD 11/16/2023 04:12 PM EDT RP Workstation: [id number]   MR BRAIN WO CONTRAST Result Date: 11/16/2023 EXAM: MRI BRAIN WITHOUT CONTRAST 11/16/2023 11:20:00 AM TECHNIQUE: Multiplanar multisequence MRI of the head/brain was performed without the administration of intravenous contrast. COMPARISON: None available. CLINICAL HISTORY: 73 y.o. male with hx of type 1 diabetes, chronic orthostatic hypotension, paroxysmal atrial fibrillation on Eliquis , seizures, mild cognitive impairment, recent fall, and subdural hematoma in June 2025. Presented to emergency department after a fall with intraventricular hemorrhage. FINDINGS: BRAIN AND VENTRICLES: No acute infarct. No mass. No midline shift. The ventricles are similar in caliber to the prior CT. T2/FLAIR intensity in the periventricular and subcortical white matter suggestive of mild-to-moderate chronic  microvascular ischemic changes. Mild volume loss. Chronic microhemorrhage in the left frontal lobe. Additional susceptibility along the falx and tentorium corresponding to areas of mineralization on CT. Focal signal abnormality and susceptibility within the body of the right lateral ventricle corresponding to intraventricular blood products noted on CT. Additional scattered areas of susceptibility within the body of the left lateral ventricle with additional layering susceptibility in the occipital horns compatible with known intraventricular blood products accounting for differences in modality. ORBITS: No acute abnormality.  SINUSES AND MASTOIDS: Mild mucosal thickening in the maxillary sinuses. BONES AND SOFT TISSUES: Normal marrow signal. No acute soft tissue abnormality. IMPRESSION: 1. Intraventricular blood products in the lateral ventricles, similar to recent CT. 2. No midline shift. 3. Similar caliber of the ventricles. 4. Mild-to-moderate chronic microvascular ischemic changes. Mild volume loss. Electronically signed by: Donnice Mania MD 11/16/2023 12:15 PM EDT RP Workstation: [id number]   ECHOCARDIOGRAM COMPLETE Result Date: 11/16/2023    ECHOCARDIOGRAM REPORT   Patient Name:   LETRELL ATTWOOD Date of Exam: 11/16/2023 Medical Rec #:  [id number]       Height:       70.0 in Accession #:    [id number]      Weight:       136.2 lb Date of Birth:  07-27-1950      BSA:          1.773 m Patient Age:    72 years        BP:           164/86 mmHg Patient Gender: M               HR:           62 bpm. Exam Location:  [facility] Procedure: 2D Echo, Cardiac Doppler and Color Doppler (Both Spectral and Color            Flow Doppler were utilized during procedure). Indications:     Stroke I63.9  History:         Patient has prior history of Echocardiogram examinations, most                  recent 03/10/2023.  Sonographer:     Rosina Dunk Referring Phys:  [id number] [facility]UDEL Diagnosing Phys: Lonni Hanson MD   Sonographer Comments: No subcostal window. IMPRESSIONS  1. Left ventricular ejection fraction, by estimation, is 65 to 70%. The left ventricle has normal function. Left ventricular endocardial border not optimally defined to evaluate regional wall motion. There is moderate left ventricular hypertrophy. Left ventricular diastolic function could not be evaluated.  2. Right ventricular systolic function is normal. The right ventricular size is normal.  3. A small pericardial effusion is present.  4. The mitral valve is degenerative. Trivial mitral valve regurgitation. No evidence of mitral stenosis.  5. The aortic valve was not well visualized. There is moderate calcification of the aortic valve. There is moderate thickening of the aortic valve. Aortic valve regurgitation is not visualized. Aortic valve sclerosis/calcification is present, without any evidence of aortic stenosis.  6. Rhythm strip during this exam demonstrates atrial fibrillation. FINDINGS  Left Ventricle: Left ventricular ejection fraction, by estimation, is 65 to 70%. The left ventricle has normal function. Left ventricular endocardial border not optimally defined to evaluate regional wall motion. The left ventricular internal cavity size was normal in size. There is moderate left ventricular hypertrophy. Left ventricular diastolic function could not be evaluated due to atrial fibrillation. Left ventricular diastolic function could not be evaluated. Right Ventricle: The right ventricular size is normal. No increase in right ventricular wall thickness. Right ventricular systolic function is normal. Left Atrium: Left atrial size was normal in size. Right Atrium: Right atrial size was normal in size. Pericardium: A small pericardial effusion is present. Mitral Valve: The mitral valve is degenerative in appearance. There is mild thickening of the mitral valve leaflet(s). There is mild calcification of the mitral valve leaflet(s). Trivial mitral valve  regurgitation. No evidence of mitral valve  stenosis. MV peak gradient, 10.6 mmHg. The mean mitral valve gradient is 4.0 mmHg. Tricuspid Valve: The tricuspid valve is not well visualized. Tricuspid valve regurgitation is trivial. Aortic Valve: The aortic valve was not well visualized. There is moderate calcification of the aortic valve. There is moderate thickening of the aortic valve. Aortic valve regurgitation is not visualized. Aortic valve sclerosis/calcification is present, without any evidence of aortic stenosis. Aortic valve mean gradient measures 3.0 mmHg. Aortic valve peak gradient measures 5.8 mmHg. Aortic valve area, by VTI measures 2.51 cm. Pulmonic Valve: The pulmonic valve was not well visualized. Pulmonic valve regurgitation is not visualized. No evidence of pulmonic stenosis. Aorta: The aortic root is normal in size and structure. Pulmonary Artery: The pulmonary artery is not well seen. IAS/Shunts: The interatrial septum was not well visualized. EKG: Rhythm strip during this exam demonstrates atrial fibrillation.  LEFT VENTRICLE PLAX 2D LVIDd:         3.60 cm     Diastology LVIDs:         2.00 cm     LV e' medial:    10.00 cm/s LV PW:         1.40 cm     LV E/e' medial:  7.3 LV IVS:        1.20 cm     LV e' lateral:   14.40 cm/s LVOT diam:     2.20 cm     LV E/e' lateral: 5.1 LV SV:         55 LV SV Index:   31 LVOT Area:     3.80 cm  LV Volumes (MOD) LV vol d, MOD A2C: 29.5 ml LV vol d, MOD A4C: 35.2 ml LV vol s, MOD A2C: 11.5 ml LV vol s, MOD A4C: 16.2 ml LV SV MOD A2C:     18.0 ml LV SV MOD A4C:     35.2 ml LV SV MOD BP:      19.8 ml RIGHT VENTRICLE RV Basal diam:  2.70 cm RV Mid diam:    2.40 cm RV S prime:     12.00 cm/s TAPSE (M-mode): 1.7 cm LEFT ATRIUM             Index        RIGHT ATRIUM          Index LA diam:        3.50 cm 1.97 cm/m   RA Area:     9.41 cm LA Vol (A2C):   45.8 ml 25.83 ml/m  RA Volume:   16.70 ml 9.42 ml/m LA Vol (A4C):   52.5 ml 29.61 ml/m LA Biplane Vol: 53.6 ml  30.23 ml/m  AORTIC VALVE                    PULMONIC VALVE AV Area (Vmax):    2.28 cm     PV Vmax:        1.23 m/s AV Area (Vmean):   2.28 cm     PV Vmean:       81.600 cm/s AV Area (VTI):     2.51 cm     PV VTI:         0.197 m AV Vmax:           120.00 cm/s  PV Peak grad:   6.1 mmHg AV Vmean:          78.500 cm/s  PV Mean grad:   3.0 mmHg AV VTI:  0.220 m      RVOT Peak grad: 5 mmHg AV Peak Grad:      5.8 mmHg AV Mean Grad:      3.0 mmHg LVOT Vmax:         71.90 cm/s LVOT Vmean:        47.100 cm/s LVOT VTI:          0.145 m LVOT/AV VTI ratio: 0.66  AORTA Ao Root diam: 3.50 cm MITRAL VALVE MV Area (PHT): 3.33 cm    SHUNTS MV Area VTI:   1.64 cm    Systemic VTI:  0.14 m MV Peak grad:  10.6 mmHg   Systemic Diam: 2.20 cm MV Mean grad:  4.0 mmHg    Pulmonic VTI:  0.165 m MV Vmax:       1.63 m/s MV Vmean:      84.6 cm/s MV Decel Time: 228 msec MV E velocity: 73.10 cm/s Lonni Hanson MD Electronically signed by Lonni Hanson MD Signature Date/Time: 11/16/2023/10:47:56 AM    Final    CT HEAD WO CONTRAST ( ) Result Date: 11/15/2023 EXAM: CT HEAD WITHOUT CONTRAST 11/15/2023 01:39:27 PM TECHNIQUE: CT of the head was performed without the administration of intravenous contrast. Automated exposure control, iterative reconstruction, and/or weight based adjustment of the mA/kV was utilized to reduce the radiation dose to as low as reasonably achievable. COMPARISON: CT head earlier same day. CLINICAL HISTORY: Head trauma, intracranial arterial injury suspected; repeat 6 hour. FINDINGS: BRAIN AND VENTRICLES: There is a similar focus of intraventricular hemorrhage within the body of the right lateral ventricle which is similar to prior. Additional smaller focus of hemorrhage within the body of the left lateral ventricle is slightly decreased in size. There is a small focus of hemorrhage extending into the right occipital horn likely reflecting a redistribution of blood products. The hemorrhage in the left  occipital horn seen on prior CT is not well visualized. There are no areas of parenchymal hemorrhage visualized. No midline shift. Similar dural calcifications. Mild probable volume loss. ORBITS: No acute abnormality. SINUSES: No acute abnormality. SOFT TISSUES AND SKULL: No acute soft tissue abnormality. No skull fracture. IMPRESSION: 1. Similar focus of intraventricular hemorrhage within the body of the right lateral ventricle. 2. Additional smaller focus of hemorrhage within the body of the left lateral ventricle, slightly decreased in size. 3. Small focus of hemorrhage in the right occipital horn is new from prior, possibly redistribution of blood products. Electronically signed by: Donnice Mania MD 11/15/2023 02:15 PM EDT RP Workstation: [id number]   CT HEAD WO CONTRAST ( ) Addendum Date: 11/15/2023 ADDENDUM REPORT: 11/15/2023 08:00 ADDENDUM: Study discussed by telephone with Dr. Ernest in the ED on 11/15/2023 at 0755 hours. Electronically Signed   By: VEAR Hurst M.D.   On: 11/15/2023 08:00   Result Date: 11/15/2023 CLINICAL DATA:  73 year old male with syncope, fall with pain. History of multiple myeloma. EXAM: CT HEAD WITHOUT CONTRAST TECHNIQUE: Contiguous axial images were obtained from the base of the skull through the vertex without intravenous contrast. RADIATION DOSE REDUCTION: This exam was performed according to the departmental dose-optimization program which includes automated exposure control, adjustment of the mA and/or kV according to patient size and/or use of iterative reconstruction technique. COMPARISON:  Recent noncontrast head CT 11/11/2023. Brain MRI 10/18/2022. FINDINGS: Brain: Small volume right greater than left lateral intraventricular hemorrhage (series 3, image 17, coronal image 34) is new. No definite transependymal extension of blood. No ventriculomegaly. Trace layering occipital horn blood on the left is  also new series 3, image 14. No other acute intracranial hemorrhage  identified. No intracranial mass effect or midline shift. Stable gray-white matter differentiation throughout the brain. No cortically based acute infarct identified. Incidental bulky chronic dural calcifications. Vascular: Calcified atherosclerosis at the skull base. No suspicious intracranial vascular hyperdensity. Skull: Appears stable and intact. No destructive osseous lesion identified. Sinuses/Orbits: Visualized paranasal sinuses and mastoids are stable and well aerated. Other: No acute orbit or scalp soft tissue injury identified. ---------------------------------------------------- Traumatic Brain Injury Risk Stratification Skull Fracture: No - Low/mBIG 1 Subdural Hematoma (SDH): No - Low Subarachnoid Hemorrhage [facility]): No Epidural Hematoma (EDH): No - Low/mBIG 1 Cerebral contusion, intra-axial, intraparenchymal Hemorrhage (IPH): No Intraventricular Hemorrhage (IVH): Yes - High/mBIG 3 Midline Shift > 1mm or Edema/effacement of sulci/vents: No - Low/mBIG 1 ---------------------------------------------------- IMPRESSION: 1. Positive for small volume acute intraventricular hemorrhage, greater on the right and likely posttraumatic. No associated ventriculomegaly. 2. No other acute intracranial abnormality or acute traumatic injury identified. Electronically Signed: By: VEAR Hurst M.D. On: 11/15/2023 07:48   CT Cervical Spine Wo Contrast Result Date: 11/15/2023 CLINICAL DATA:  Syncope with fall.  Left arm pain. EXAM: CT CERVICAL SPINE WITHOUT CONTRAST TECHNIQUE: Multidetector CT imaging of the cervical spine was performed without intravenous contrast. Multiplanar CT image reconstructions were also generated. RADIATION DOSE REDUCTION: This exam was performed according to the departmental dose-optimization program which includes automated exposure control, adjustment of the mA and/or kV according to patient size and/or use of iterative reconstruction technique. COMPARISON:  08/27/2023 FINDINGS: Alignment: Trace  degenerative retrolisthesis of C5 on 6 and C6 on 7 with corresponding loss of disc height at both levels. No evidence for traumatic subluxation. Skull base and vertebrae: No acute fracture. No primary bone lesion or focal pathologic process. Soft tissues and spinal canal: No prevertebral fluid or swelling. No visible canal hematoma. Disc levels: Loss of disc height noted C5-6 and C6-7. Facets are well aligned bilaterally with some degenerative left-sided facet disease in the mid cervical levels. Upper chest: Unremarkable. Other: None. IMPRESSION: Degenerative changes in the cervical spine without evidence for an acute fracture or traumatic subluxation. Electronically Signed   By: Camellia Candle M.D.   On: 11/15/2023 07:43   DG Chest Portable 1 View Result Date: 11/15/2023 CLINICAL DATA:  73 year old male with syncope, fall with pain. History of multiple myeloma. EXAM: PORTABLE CHEST 1 VIEW COMPARISON:  Portable chest 11/11/2023. FINDINGS: Portable AP semi upright view at 0621 hours. Partially visible fracture of the proximal left humerus shaft, medial displacement, this area not included on recent comparisons. Normal lung volumes and mediastinal contours. Visualized tracheal air column is within normal limits. Allowing for portable technique the lungs are clear. Negative visible bowel gas pattern. No other No acute osseous abnormality identified. IMPRESSION: 1. Partially visible displaced proximal left humerus fracture. 2. No acute cardiopulmonary abnormality. Electronically Signed   By: VEAR Hurst M.D.   On: 11/15/2023 06:45   DG Pelvis Portable Result Date: 11/15/2023 CLINICAL DATA:  73 year old male with syncope, fall with pain. History of multiple myeloma. EXAM: PORTABLE PELVIS 1-2 VIEWS COMPARISON:  Abdominal radiographs 08/30/2023 and earlier. FINDINGS: Portable AP supine view at 0620 hours. Pelvis bone mineralization appears stable and within normal limits. Femoral heads appear located. No pelvis fracture  identified. Grossly intact proximal femurs. Calcified iliofemoral atherosclerosis. Nonobstructed bowel-gas pattern. IMPRESSION: No acute fracture or dislocation identified about the pelvis. Electronically Signed   By: VEAR Hurst M.D.   On: 11/15/2023 06:43   DG Chest [facility]  1 View Result Date: 11/11/2023 EXAM: 1 VIEW XRAY OF THE CHEST 11/11/2023 03:33:00 PM COMPARISON: AP radiograph of the chest dated 09/23/2023. CLINICAL HISTORY: Fall trauma. FINDINGS: LUNGS AND PLEURA: No focal pulmonary opacity. No pulmonary edema. No pleural effusion. No pneumothorax. HEART AND MEDIASTINUM: No acute abnormality of the cardiac and mediastinal silhouettes. BONES AND SOFT TISSUES: No acute osseous abnormality. IMPRESSION: 1. No acute process. Electronically signed by: evalene coho 11/11/2023 03:58 PM EDT RP Workstation: [id number]   CT Cervical Spine Wo Contrast Result Date: 11/11/2023 EXAM: CT CERVICAL SPINE WITHOUT CONTRAST 11/11/2023 03:50:08 PM TECHNIQUE: CT of the cervical spine was performed without the administration of intravenous contrast. Multiplanar reformatted images are provided for review. Automated exposure control, iterative reconstruction, and/or weight based adjustment of the mA/kV was utilized to reduce the radiation dose to as low as reasonably achievable. COMPARISON: CT of the cervical spine dated 08/27/2023. CLINICAL HISTORY: Neck pain, acute, no red flags. PT BIB [facility] from home. PT was outside and fell. Fall was witnessed by son who states he hit his head. PT denies hitting head. PT is slightly confused which is not new but no dx of dementia. In the last 12 hrs. PT reportedly pulled out more of the wiring in his insulin  pump. CBG 598 on scene and minimal fluids given. FINDINGS: CERVICAL SPINE: BONES AND ALIGNMENT: No acute fracture or traumatic malalignment. DEGENERATIVE CHANGES: Slight degenerative ventralisthesis at C3-4. Chronic degenerative disc disease at C5-6 and C6-7 with mild central spinal  canal stenosis at both levels. SOFT TISSUES: No prevertebral soft tissue swelling. VASCULATURE: Moderate calcification within the carotid bulbs bilaterally. IMPRESSION: 1. No acute abnormality of the cervical spine related to the reported fall. 2. Chronic degenerative disc disease at C5-6 and C6-7 with mild central spinal canal stenosis at both levels. Electronically signed by: evalene coho 11/11/2023 03:57 PM EDT RP Workstation: [id number]   CT Head Wo Contrast Result Date: 11/11/2023 EXAM: CT HEAD WITHOUT CONTRAST 11/11/2023 03:50:08 PM TECHNIQUE: CT of the head was performed without the administration of intravenous contrast. Automated exposure control, iterative reconstruction, and/or weight based adjustment of the mA/kV was utilized to reduce the radiation dose to as low as reasonably achievable. COMPARISON: CT of the head dated 09/14/2023. CLINICAL HISTORY: Head trauma, moderate-severe. PT BIB [facility] from home. PT was outside and fell. Fall was witnessed by son who states he hit his head. PT denies hitting head. PT is slightly confused which is not new but no dx of dementia. In the last 12 hrs. PT reportedly pulled out more of the wiring in his insulin  pump. CBG 598 on scene and minimal fluids given. FINDINGS: BRAIN AND VENTRICLES: No acute hemorrhage. Gray-white differentiation is preserved. No hydrocephalus. No extra-axial collection. No mass effect or midline shift. Age-related cerebral volume loss and mild periventricular white matter disease. ORBITS: No acute abnormality. SINUSES: No acute abnormality. SOFT TISSUES AND SKULL: No acute soft tissue abnormality. No skull fracture. Extensive dystrophic calcifications along the falx. Moderate vascular calcifications. The previously described subdural hematoma has resolved. IMPRESSION: 1. No acute intracranial abnormality related to the head trauma. 2. Age-related cerebral volume loss and mild periventricular white matter disease. 3. Extensive dystrophic  calcifications along the falx and moderate vascular calcifications. Electronically signed by: evalene coho 11/11/2023 03:54 PM EDT RP Workstation: [id number]     Assessment and plan- Patient is a 73 y.o. male with a history of high risk IgG lambda multiple myeloma R-ISS stage III B here for on treatment assessment prior to next cycle  of Darzalex   Patient was recently discharged from the hospital for an episode of intraventricular hemorrhage following a fall.  He was seen by neurology as an outpatient yesterday and plans to get another repeat CT head soon.  Patient does not verbalize much today and is currently at the rehab.  He appears much frailer as compared to before and I am therefore holding off on giving him any treatment today.  We were also not able to draw his labs today due to poor access.  I will reassess how he is doing in about 4 weeks time.  Patient will continue to hold his Pomalyst  in the meanwhile and I have explained all this to patient significant other   Visit Diagnosis 1. Multiple myeloma not having achieved remission (HCC)      Dr. Annah Skene, MD, MPH [facility] at [facility] [phone] 12/08/2023 12:43 PM

## 2023-12-09 ENCOUNTER — Other Ambulatory Visit: Payer: Self-pay

## 2023-12-15 ENCOUNTER — Other Ambulatory Visit: Payer: Self-pay | Admitting: Internal Medicine

## 2023-12-15 ENCOUNTER — Telehealth: Payer: Self-pay | Admitting: *Deleted

## 2023-12-15 NOTE — Telephone Encounter (Signed)
 The messages that she has been trying to call so that time it is full and you cannot do anything for that.  Center [facility]  also sent a letter to him and asked them to call so that he can get set up with his Pomalyst .  Patient never called them at all.  Pharmacy wanted to know if anybody here could get in touch with him for the Pomalyst 

## 2023-12-16 NOTE — Telephone Encounter (Signed)
 Looks like a prescription refill was sent in for him on 12/02/23. Will call [facility] to have this prescription dc'ed

## 2023-12-16 NOTE — Telephone Encounter (Signed)
 His pomalyst  is currently on hold.

## 2023-12-22 ENCOUNTER — Other Ambulatory Visit: Payer: Self-pay

## 2023-12-24 ENCOUNTER — Inpatient Hospital Stay (HOSPITAL_COMMUNITY)
Admission: EM | Admit: 2023-12-24 | Discharge: 2023-12-29 | DRG: 637 | Disposition: A | Discharge status: Skilled Nursing Facility | Source: Skilled Nursing Facility | Attending: Family Medicine | Admitting: Family Medicine

## 2023-12-24 ENCOUNTER — Emergency Department (HOSPITAL_COMMUNITY)

## 2023-12-24 ENCOUNTER — Encounter (HOSPITAL_COMMUNITY): Payer: Self-pay

## 2023-12-24 ENCOUNTER — Other Ambulatory Visit: Payer: Self-pay

## 2023-12-24 DIAGNOSIS — C9000 Multiple myeloma not having achieved remission: Secondary | ICD-10-CM | POA: Diagnosis present

## 2023-12-24 DIAGNOSIS — G40909 Epilepsy, unspecified, not intractable, without status epilepticus: Secondary | ICD-10-CM | POA: Diagnosis present

## 2023-12-24 DIAGNOSIS — F0390 Unspecified dementia without behavioral disturbance: Secondary | ICD-10-CM

## 2023-12-24 DIAGNOSIS — S4292XD Fracture of left shoulder girdle, part unspecified, subsequent encounter for fracture with routine healing: Secondary | ICD-10-CM | POA: Diagnosis not present

## 2023-12-24 DIAGNOSIS — E10649 Type 1 diabetes mellitus with hypoglycemia without coma: Principal | ICD-10-CM | POA: Diagnosis present

## 2023-12-24 DIAGNOSIS — Z9641 Presence of insulin pump (external) (internal): Secondary | ICD-10-CM | POA: Diagnosis present

## 2023-12-24 DIAGNOSIS — I951 Orthostatic hypotension: Secondary | ICD-10-CM | POA: Diagnosis present

## 2023-12-24 DIAGNOSIS — S0636AS Traumatic hemorrhage of cerebrum, unspecified, with loss of consciousness status unknown, sequela: Secondary | ICD-10-CM

## 2023-12-24 DIAGNOSIS — Z7952 Long term (current) use of systemic steroids: Secondary | ICD-10-CM

## 2023-12-24 DIAGNOSIS — Z794 Long term (current) use of insulin: Secondary | ICD-10-CM

## 2023-12-24 DIAGNOSIS — E1022 Type 1 diabetes mellitus with diabetic chronic kidney disease: Secondary | ICD-10-CM | POA: Diagnosis present

## 2023-12-24 DIAGNOSIS — Z89422 Acquired absence of other left toe(s): Secondary | ICD-10-CM

## 2023-12-24 DIAGNOSIS — E569 Vitamin deficiency, unspecified: Secondary | ICD-10-CM | POA: Diagnosis not present

## 2023-12-24 DIAGNOSIS — Z751 Person awaiting admission to adequate facility elsewhere: Secondary | ICD-10-CM | POA: Diagnosis not present

## 2023-12-24 DIAGNOSIS — N1831 Chronic kidney disease, stage 3a: Secondary | ICD-10-CM | POA: Diagnosis present

## 2023-12-24 DIAGNOSIS — N390 Urinary tract infection, site not specified: Secondary | ICD-10-CM | POA: Diagnosis present

## 2023-12-24 DIAGNOSIS — Z880 Allergy status to penicillin: Secondary | ICD-10-CM

## 2023-12-24 DIAGNOSIS — R569 Unspecified convulsions: Secondary | ICD-10-CM | POA: Diagnosis not present

## 2023-12-24 DIAGNOSIS — I2489 Other forms of acute ischemic heart disease: Secondary | ICD-10-CM | POA: Diagnosis present

## 2023-12-24 DIAGNOSIS — I69354 Hemiplegia and hemiparesis following cerebral infarction affecting left non-dominant side: Secondary | ICD-10-CM | POA: Diagnosis not present

## 2023-12-24 DIAGNOSIS — E7800 Pure hypercholesterolemia, unspecified: Secondary | ICD-10-CM | POA: Diagnosis present

## 2023-12-24 DIAGNOSIS — G3184 Mild cognitive impairment, so stated: Secondary | ICD-10-CM | POA: Diagnosis present

## 2023-12-24 DIAGNOSIS — G9341 Metabolic encephalopathy: Secondary | ICD-10-CM | POA: Diagnosis present

## 2023-12-24 DIAGNOSIS — I129 Hypertensive chronic kidney disease with stage 1 through stage 4 chronic kidney disease, or unspecified chronic kidney disease: Secondary | ICD-10-CM | POA: Diagnosis present

## 2023-12-24 DIAGNOSIS — I4891 Unspecified atrial fibrillation: Secondary | ICD-10-CM | POA: Diagnosis present

## 2023-12-24 DIAGNOSIS — R4182 Altered mental status, unspecified: Principal | ICD-10-CM

## 2023-12-24 DIAGNOSIS — S065XAS Traumatic subdural hemorrhage with loss of consciousness status unknown, sequela: Secondary | ICD-10-CM

## 2023-12-24 DIAGNOSIS — F1911 Other psychoactive substance abuse, in remission: Secondary | ICD-10-CM | POA: Diagnosis present

## 2023-12-24 DIAGNOSIS — Z8249 Family history of ischemic heart disease and other diseases of the circulatory system: Secondary | ICD-10-CM | POA: Diagnosis not present

## 2023-12-24 DIAGNOSIS — W19XXXS Unspecified fall, sequela: Secondary | ICD-10-CM | POA: Diagnosis present

## 2023-12-24 DIAGNOSIS — R519 Headache, unspecified: Secondary | ICD-10-CM | POA: Diagnosis present

## 2023-12-24 DIAGNOSIS — R1310 Dysphagia, unspecified: Secondary | ICD-10-CM | POA: Diagnosis present

## 2023-12-24 DIAGNOSIS — F05 Delirium due to known physiological condition: Secondary | ICD-10-CM | POA: Diagnosis not present

## 2023-12-24 DIAGNOSIS — N183 Chronic kidney disease, stage 3 unspecified: Secondary | ICD-10-CM | POA: Diagnosis present

## 2023-12-24 DIAGNOSIS — I615 Nontraumatic intracerebral hemorrhage, intraventricular: Secondary | ICD-10-CM

## 2023-12-24 DIAGNOSIS — E785 Hyperlipidemia, unspecified: Secondary | ICD-10-CM | POA: Diagnosis present

## 2023-12-24 DIAGNOSIS — R7989 Other specified abnormal findings of blood chemistry: Secondary | ICD-10-CM | POA: Diagnosis present

## 2023-12-24 DIAGNOSIS — G9389 Other specified disorders of brain: Secondary | ICD-10-CM | POA: Diagnosis present

## 2023-12-24 DIAGNOSIS — Z87898 Personal history of other specified conditions: Secondary | ICD-10-CM

## 2023-12-24 DIAGNOSIS — G9340 Encephalopathy, unspecified: Secondary | ICD-10-CM | POA: Diagnosis not present

## 2023-12-24 DIAGNOSIS — Z9181 History of falling: Secondary | ICD-10-CM

## 2023-12-24 DIAGNOSIS — E1010 Type 1 diabetes mellitus with ketoacidosis without coma: Secondary | ICD-10-CM | POA: Diagnosis present

## 2023-12-24 DIAGNOSIS — Z79899 Other long term (current) drug therapy: Secondary | ICD-10-CM | POA: Diagnosis not present

## 2023-12-24 LAB — I-STAT CHEM 8, ED
BUN: 32 mg/dL — ABNORMAL HIGH (ref 8–23)
Calcium, Ion: 1.11 mmol/L — ABNORMAL LOW (ref 1.15–1.40)
Chloride: 104 mmol/L (ref 98–111)
Creatinine, Ser: 1.1 mg/dL (ref 0.61–1.24)
Glucose, Bld: 214 mg/dL — ABNORMAL HIGH (ref 70–99)
HCT: 26 % — ABNORMAL LOW (ref 39.0–52.0)
Hemoglobin: 8.8 g/dL — ABNORMAL LOW (ref 13.0–17.0)
Potassium: 4.5 mmol/L (ref 3.5–5.1)
Sodium: 138 mmol/L (ref 135–145)
TCO2: 25 mmol/L (ref 22–32)

## 2023-12-24 LAB — COMPREHENSIVE METABOLIC PANEL WITH GFR
ALT: 39 U/L (ref 0–44)
AST: 31 U/L (ref 15–41)
Albumin: 2.6 g/dL — ABNORMAL LOW (ref 3.5–5.0)
Alkaline Phosphatase: 97 U/L (ref 38–126)
Anion gap: 9 (ref 5–15)
BUN: 27 mg/dL — ABNORMAL HIGH (ref 8–23)
CO2: 22 mmol/L (ref 22–32)
Calcium: 8.2 mg/dL — ABNORMAL LOW (ref 8.9–10.3)
Chloride: 104 mmol/L (ref 98–111)
Creatinine, Ser: 1.05 mg/dL (ref 0.61–1.24)
GFR, Estimated: >60 mL/min (ref >60)
Glucose, Bld: 202 mg/dL — ABNORMAL HIGH (ref 70–99)
Potassium: 4.2 mmol/L (ref 3.5–5.1)
Sodium: 135 mmol/L (ref 135–145)
Total Bilirubin: 0.5 mg/dL (ref 0.0–1.2)
Total Protein: 8 g/dL (ref 6.5–8.1)

## 2023-12-24 LAB — CBC WITH DIFFERENTIAL/PLATELET
Abs Immature Granulocytes: 0.01 K/uL (ref 0.00–0.07)
Basophils Absolute: 0 K/uL (ref 0.0–0.1)
Basophils Relative: 0 %
Eosinophils Absolute: 0.1 K/uL (ref 0.0–0.5)
Eosinophils Relative: 1 %
HCT: 25.1 % — ABNORMAL LOW (ref 39.0–52.0)
Hemoglobin: 8 g/dL — ABNORMAL LOW (ref 13.0–17.0)
Immature Granulocytes: 0 %
Lymphocytes Relative: 29 %
Lymphs Abs: 1.1 K/uL (ref 0.7–4.0)
MCH: 32.4 pg (ref 26.0–34.0)
MCHC: 31.9 g/dL (ref 30.0–36.0)
MCV: 101.6 fL — ABNORMAL HIGH (ref 80.0–100.0)
Monocytes Absolute: 0.6 K/uL (ref 0.1–1.0)
Monocytes Relative: 14 %
Neutro Abs: 2.2 K/uL (ref 1.7–7.7)
Neutrophils Relative %: 56 %
Platelets: 253 K/uL (ref 150–400)
RBC: 2.47 MIL/uL — ABNORMAL LOW (ref 4.22–5.81)
RDW: 16.1 % — ABNORMAL HIGH (ref 11.5–15.5)
WBC: 3.9 K/uL — ABNORMAL LOW (ref 4.0–10.5)
nRBC: 0 % (ref 0.0–0.2)

## 2023-12-24 LAB — I-STAT VENOUS BLOOD GAS, ED
Acid-Base Excess: 2 mmol/L (ref 0.0–2.0)
Bicarbonate: 25.8 mmol/L (ref 20.0–28.0)
Calcium, Ion: 1.09 mmol/L — ABNORMAL LOW (ref 1.15–1.40)
HCT: 26 % — ABNORMAL LOW (ref 39.0–52.0)
Hemoglobin: 8.8 g/dL — ABNORMAL LOW (ref 13.0–17.0)
O2 Saturation: 99 %
Potassium: 4.5 mmol/L (ref 3.5–5.1)
Sodium: 138 mmol/L (ref 135–145)
TCO2: 27 mmol/L (ref 22–32)
pCO2, Ven: 35 mmHg — ABNORMAL LOW (ref 44–60)
pH, Ven: 7.476 — ABNORMAL HIGH (ref 7.25–7.43)
pO2, Ven: 115 mmHg — ABNORMAL HIGH (ref 32–45)

## 2023-12-24 LAB — TROPONIN I (HIGH SENSITIVITY)
Troponin I (High Sensitivity): 110 ng/L (ref <18)
Troponin I (High Sensitivity): 79 ng/L — ABNORMAL HIGH (ref <18)

## 2023-12-24 LAB — CBG MONITORING, ED: Glucose-Capillary: 198 mg/dL — ABNORMAL HIGH (ref 70–99)

## 2023-12-24 LAB — I-STAT CG4 LACTIC ACID, ED: Lactic Acid, Venous: 1.4 mmol/L (ref 0.5–1.9)

## 2023-12-24 LAB — ETHANOL: Alcohol, Ethyl (B): <15 mg/dL (ref <15)

## 2023-12-24 MED ORDER — ACETAMINOPHEN 650 MG RE SUPP: 650.0000 mg | Freq: Four times a day (QID) | RECTAL | Status: DC | PRN

## 2023-12-24 MED ORDER — HYDRALAZINE HCL 20 MG/ML IJ SOLN
10.0000 mg | Freq: Three times a day (TID) | INTRAMUSCULAR | Status: DC | PRN
  Administered 2023-12-24 – 2023-12-29 (×2): 10 mg via INTRAVENOUS
  Filled 2023-12-24 (×2): qty 1

## 2023-12-24 MED ORDER — DOCUSATE SODIUM 100 MG PO CAPS
100.0000 mg | ORAL_CAPSULE | Freq: Two times a day (BID) | ORAL | Status: DC
  Administered 2023-12-24 – 2023-12-29 (×5): 100 mg via ORAL
  Filled 2023-12-24 (×7): qty 1

## 2023-12-24 MED ORDER — ATORVASTATIN CALCIUM 80 MG PO TABS
80.0000 mg | ORAL_TABLET | Freq: Every day | ORAL | Status: DC
  Administered 2023-12-24 – 2023-12-28 (×4): 80 mg via ORAL
  Filled 2023-12-24 (×2): qty 1
  Filled 2023-12-24: qty 2
  Filled 2023-12-24 (×2): qty 1

## 2023-12-24 MED ORDER — RISPERIDONE 0.5 MG PO TABS: 0.5000 mg | ORAL_TABLET | Freq: Three times a day (TID) | ORAL | Status: DC | PRN

## 2023-12-24 MED ORDER — SODIUM CHLORIDE 0.9 % IV SOLN
INTRAVENOUS | Status: DC
Start: 2023-12-24 — End: 2023-12-25

## 2023-12-24 MED ORDER — ACETAMINOPHEN 325 MG PO TABS
650.0000 mg | ORAL_TABLET | Freq: Four times a day (QID) | ORAL | Status: DC | PRN
  Administered 2023-12-28 – 2023-12-29 (×2): 650 mg via ORAL
  Filled 2023-12-24 (×3): qty 2

## 2023-12-24 MED ORDER — LEVETIRACETAM 500 MG PO TABS: 500.0000 mg | ORAL_TABLET | Freq: Two times a day (BID) | ORAL | Status: DC

## 2023-12-24 MED ORDER — POLYETHYLENE GLYCOL 3350 17 G PO PACK: 17.0000 g | PACK | Freq: Every day | ORAL | Status: DC | PRN

## 2023-12-24 MED ORDER — LEVETIRACETAM (KEPPRA) 500 MG/5 ML ADULT IV PUSH
500.0000 mg | Freq: Two times a day (BID) | INTRAVENOUS | Status: DC
  Administered 2023-12-24 – 2023-12-28 (×8): 500 mg via INTRAVENOUS
  Filled 2023-12-24 (×10): qty 5

## 2023-12-24 MED ORDER — ACETAMINOPHEN 325 MG PO TABS: 650.0000 mg | ORAL_TABLET | Freq: Four times a day (QID) | ORAL | Status: DC | PRN

## 2023-12-24 NOTE — H&P (Addendum)
 History and Physical    Nicholas Weiss FMW:[id number] DOB: [birth year] DOA: 12/24/2023  PCP: Alla Amis, MD   Patient coming from: Skilled nursing Facility.  I have personally briefly reviewed patient's old medical records in [facility].  Chief Complaint: Confusion, barely responsive.   HPI: Nicholas Weiss is a 73 y.o. male with PMH significant for type 1 diabetes, hyperlipidemia, both subdural and intraventricular hemorrhage, seizure disorder was sent from skilled nursing facility for altered mental status.  Patient was found to be confused, barely responsive.  Last known well time 10 : 45.  He was found to be hypoglycemic blood glucose was 40,  Patient was given glucagon , subsequently staff noticed he still appeared to be altered from his baseline.  At baseline he is slow to respond but following commands.  Nursing home staff denies any fever, sore throat, cough, nausea, vomiting, or other sick contacts.  ED Course: He was hypertensive other vitals were stable Temp 98.3, HR 85, RR 12, BP 190/117 SpO2 100% on room air Labs include sodium 135, potassium 4.2, chloride 104, bicarb 22, glucose 202, BUN 27, creatinine 1.10, calcium  8.2, alkaline phosphatase 97, albumin 2.6, AST 31, ALT 39, total protein 8.0, troponin 110, WBC 3.9, hemoglobin 8.8, MCV 101, platelet 253, UA pending CT head: No acute intracranial abnormality. Left supraorbital scalp hematoma without underlying fracture.  CT C-spine :  No acute abnormality of the cervical spine related to the reported neck trauma.  CT maxillofacial >  Left supraorbital scalp hematoma without underlying fracture or foreign body. Minimal mucosal thickening in the left maxillary sinus. EKG normal sinus rhythm, short PR interval.  Review of Systems:  Review of Systems  Constitutional: Negative.   HENT: Negative.    Eyes: Negative.   Respiratory: Negative.    Cardiovascular: Negative.   Gastrointestinal: Negative.   Genitourinary:  Negative.   Musculoskeletal:  Positive for falls.  Skin: Negative.   Neurological:        Mental status, confusion,  Psychiatric/Behavioral: Negative.      Past Medical History:  Diagnosis Date   Acute metabolic encephalopathy 12/02/2020   DKA (diabetic ketoacidoses) 04/06/2016   Hypercholesteremia    Hypertension     Past Surgical History:  Procedure Laterality Date   AMPUTATION TOE Left 04/25/2022   Procedure: LEFT GREAT TOE AMPUTATION AND LEFT PARTIAL 2ND TOE AMPUTATION;  Surgeon: Janit Thresa HERO, DPM;  Location: [facility] ORS;  Service: Podiatry;  Laterality: Left;   COLONOSCOPY WITH PROPOFOL  N/A 02/01/2020   Procedure: COLONOSCOPY WITH PROPOFOL ;  Surgeon: Unk Corinn Skiff, MD;  Location: [facility] ENDOSCOPY;  Service: Gastroenterology;  Laterality: N/A;   ESOPHAGOGASTRODUODENOSCOPY  02/01/2020   Procedure: ESOPHAGOGASTRODUODENOSCOPY (EGD);  Surgeon: Unk Corinn Skiff, MD;  Location: [facility] ENDOSCOPY;  Service: Gastroenterology;;   ESOPHAGOGASTRODUODENOSCOPY (EGD) WITH PROPOFOL  N/A 06/07/2022   Procedure: ESOPHAGOGASTRODUODENOSCOPY (EGD) WITH PROPOFOL ;  Surgeon: Saintclair Jasper, MD;  Location: [facility] ENDOSCOPY;  Service: Gastroenterology;  Laterality: N/A;   KNEE SURGERY Right    Torn meniscus   KNEE SURGERY Left      reports that he has never smoked. He has never used smokeless tobacco. He reports that he does not currently use alcohol. He reports current drug use. Drugs: Marijuana and Crack cocaine.  Allergies  Allergen Reactions   Penicillins Other (See Comments)    Childhood allergy -tolerated amoxil  03/2022 Unknown reaction Has patient had a PCN reaction causing immediate rash, facial/tongue/throat swelling, SOB or lightheadedness with hypotension: No Has patient had a PCN reaction causing severe  rash involving mucus membranes or skin necrosis: No Has patient had a PCN reaction that required hospitalization No Has patient had a PCN reaction occurring within the last 10 years: No If all  of the above answers are NO, then may proceed with Cephalosporin use.     Family History  Problem Relation Age of Onset   Heart attack Father    Hypertension Sister    Cancer Sister    Family history reviewed and not pertinent.  Prior to Admission medications   Medication Sig Start Date End Date Taking? Authorizing Provider  acetaminophen  (TYLENOL ) 325 MG tablet Take 2 tablets (650 mg total) by mouth every 6 (six) hours as needed for mild pain (pain score 1-3) or fever. 09/17/23   Josette Ade, MD  acyclovir  (ZOVIRAX ) 400 MG tablet Take 400 mg by mouth in the morning and at bedtime.    [provider]  atorvastatin  (LIPITOR ) 80 MG tablet Take 1 tablet (80 mg total) by mouth at bedtime. Hold while taking Paxlovid  04/08/22   Caleen Qualia, MD  Continuous Glucose Sensor (DEXCOM G7 SENSOR) MISC 1 each by Does not apply route 4 (four) times daily -  before meals and at bedtime. 11/26/23   Caleen Qualia, MD  Fe Fum-Vit C-Vit B12-FA (TRIGELS-F FORTE) CAPS capsule Take 1 capsule by mouth 2 (two) times daily. 11/26/23   Amin, Sumayya, MD  fludrocortisone  (FLORINEF ) 0.1 MG tablet Take 1 tablet (0.1 mg total) by mouth daily as needed. Skip the dose if SBP >130 Patient taking differently: Take 0.1 mg by mouth daily. Skip the dose if SBP >130 09/17/23   Josette Ade, MD  gabapentin  (NEURONTIN ) 300 MG capsule Take 1 capsule (300 mg total) by mouth 2 (two) times daily. 09/17/23   Josette Ade, MD  insulin  lispro (HUMALOG) 100 UNIT/ML injection INJECT 150 UNITS EVERY 3 DAYS VIA INSULIN  PUMP AS DIRECTED 10/26/23   [provider]  iron  polysaccharides (NIFEREX) 150 MG capsule Take 150 mg by mouth.    [provider]  latanoprost  (XALATAN ) 0.005 % ophthalmic solution 1 drop at bedtime. 11/02/23   [provider]  levETIRAcetam  (KEPPRA ) 500 MG tablet Take 1 tablet by mouth twice daily 12/15/23   Vaslow, [name] K, MD  midodrine  (PROAMATINE ) 2.5 MG tablet Take 1 tablet  (2.5 mg total) by mouth 3 (three) times daily as needed (For systolic blood pressure less than 100). 11/26/23   Amin, Sumayya, MD  Multiple Vitamin (MULTIVITAMIN WITH MINERALS) TABS tablet Take 1 tablet by mouth daily. 11/27/23   Amin, Sumayya, MD  oxyCODONE -acetaminophen  (PERCOCET/ROXICET) 5-325 MG tablet Take 1 tablet by mouth every 6 (six) hours as needed for severe pain (pain score 7-10). 11/26/23   Amin, Sumayya, MD  polyethylene glycol (MIRALAX  / GLYCOLAX ) 17 g packet Take 17 g by mouth daily as needed for mild constipation. 11/26/23   Caleen Qualia, MD  POMALYST  2 MG capsule TAKE 1 CAPSULE BY MOUTH EVERY DAY FOR 21 DAYS, THEN HOLD FOR 7 DAYS. REPEAT EVERY 28 DAYS 12/02/23   Melanee Annah BROCKS, MD  risperiDONE  (RISPERDAL ) 0.5 MG tablet Take 1 tablet (0.5 mg total) by mouth every 8 (eight) hours as needed (agitation). 11/26/23   Caleen Qualia, MD    Physical Exam: Vitals:   12/24/23 1406 12/24/23 1413 12/24/23 1415 12/24/23 1500  BP:  (!) 202/127 (!) 179/112 (!) 194/117  Pulse:  88 84 85  Resp:  19 13 12   Temp: 98.3 F (36.8 C)     TempSrc: Oral  SpO2:  100% 100% 100%    Constitutional: Appears calm, comfortable, partially following commands. Vitals:   12/24/23 1406 12/24/23 1413 12/24/23 1415 12/24/23 1500  BP:  (!) 202/127 (!) 179/112 (!) 194/117  Pulse:  88 84 85  Resp:  19 13 12   Temp: 98.3 F (36.8 C)     TempSrc: Oral     SpO2:  100% 100% 100%   Eyes: PERRL, lids and conjunctivae normal ENMT: Mucous membranes are moist. Posterior pharynx clear of any exudate or lesions. Neck: normal, supple, no masses, no thyromegaly Respiratory: CTA bilaterally, no wheezing, no crackles. Normal respiratory effort. No accessory muscle use.  Cardiovascular: S1+ S2  heard. Regular rate and rhythm, no murmurs / rubs / gallops. No extremity edema.  Abdomen: Soft,  no tenderness, no masses palpated. No hepatosplenomegaly. Bowel sounds positive.  Musculoskeletal: no clubbing / cyanosis.Left sided  weakness from prior CVA.,  Skin: no rashes, lesions, ulcers. No induration Neurologic:Not assessed, Partially following commands. Psychiatric: Not Assessed.  Labs on Admission: I have personally reviewed following labs and imaging studies  CBC: Recent Labs  Lab 12/24/23 1437 12/24/23 1447  WBC 3.9*  --   NEUTROABS 2.2  --   HGB 8.0* 8.8*  8.8*  HCT 25.1* 26.0*  26.0*  MCV 101.6*  --   PLT 253  --    Basic Metabolic Panel: Recent Labs  Lab 12/24/23 1437 12/24/23 1447  NA 135 138  138  K 4.2 4.5  4.5  CL 104 104  CO2 22  --   GLUCOSE 202* 214*  BUN 27* 32*  CREATININE 1.05 1.10  CALCIUM  8.2*  --    GFR: CrCl cannot be calculated (Unknown ideal weight.). Liver Function Tests: Recent Labs  Lab 12/24/23 1437  AST 31  ALT 39  ALKPHOS 97  BILITOT 0.5  PROT 8.0  ALBUMIN 2.6*   No results for input(s): LIPASE, AMYLASE in the last 168 hours. No results for input(s): AMMONIA  in the last 168 hours. Coagulation Profile: No results for input(s): INR, PROTIME in the last 168 hours. Cardiac Enzymes: No results for input(s): CKTOTAL, CKMB, CKMBINDEX, TROPONINI in the last 168 hours. BNP (last 3 results) No results for input(s): PROBNP in the last 8760 hours. HbA1C: No results for input(s): HGBA1C in the last 72 hours. CBG: Recent Labs  Lab 12/24/23 1406  GLUCAP 198*   Lipid Profile: No results for input(s): CHOL, HDL, LDLCALC, TRIG, CHOLHDL, LDLDIRECT in the last 72 hours. Thyroid  Function Tests: No results for input(s): TSH, T4TOTAL, FREET4, T3FREE, THYROIDAB in the last 72 hours. Anemia Panel: No results for input(s): VITAMINB12, FOLATE, FERRITIN, TIBC, IRON , RETICCTPCT in the last 72 hours. Urine analysis:    Component Value Date/Time   COLORURINE YELLOW (A) 11/16/2023 0236   APPEARANCEUR HAZY (A) 11/16/2023 0236   LABSPEC 1.016 11/16/2023 0236   PHURINE 5.0 11/16/2023 0236   GLUCOSEU >=500 (A)  11/16/2023 0236   HGBUR NEGATIVE 11/16/2023 0236   BILIRUBINUR NEGATIVE 11/16/2023 0236   KETONESUR 5 (A) 11/16/2023 0236   PROTEINUR NEGATIVE 11/16/2023 0236   NITRITE NEGATIVE 11/16/2023 0236   LEUKOCYTESUR NEGATIVE 11/16/2023 0236    Radiological Exams on Admission: CT Cervical Spine Wo Contrast Result Date: 12/24/2023 EXAM: CT CERVICAL SPINE WITHOUT CONTRAST 12/24/2023 03:32:00 PM TECHNIQUE: CT of the cervical spine was performed without the administration of intravenous contrast. Multiplanar reformatted images are provided for review. Automated exposure control, iterative reconstruction, and/or weight based adjustment of the mA/kV was utilized to reduce the  radiation dose to as low as reasonably achievable. COMPARISON: CT of the cervical spine 11/15/2023. CLINICAL HISTORY: Neck trauma (Age >= 65y). Chief complaints include altered mental status, hypoglycemic, and baseline dysphagia. The patient has a baseline headache since a head bleed in August from a fall and a left shoulder fracture from a previous fall. FINDINGS: CERVICAL SPINE: BONES AND ALIGNMENT: No acute fracture or traumatic malalignment. DEGENERATIVE CHANGES: Multilevel degenerative changes are again noted in the cervical spine. SOFT TISSUES: No prevertebral soft tissue swelling. Atherosclerotic calcifications are present at the carotid bifurcations bilaterally without significant change. Stenosis is suspected. IMPRESSION: 1. No acute abnormality of the cervical spine related to the reported neck trauma. 2. Multilevel degenerative changes in the cervical spine. Electronically signed by: Lonni Necessary MD 12/24/2023 04:08 PM EDT RP Workstation: [id number]   CT Maxillofacial Wo Contrast Result Date: 12/24/2023 EXAM: CT OF THE FACE WITHOUT CONTRAST 12/24/2023 03:32:00 PM TECHNIQUE: CT of the face was performed without the administration of intravenous contrast. Multiplanar reformatted images are provided for review. Automated exposure  control, iterative reconstruction, and/or weight based adjustment of the mA/kV was utilized to reduce the radiation dose to as low as reasonably achievable. COMPARISON: CT head without contrast 12/08/2023. CLINICAL HISTORY: Facial trauma, blunt. Altered mental status, hypoglycemic, improved with glucagon . Baseline dysphagia, headache from a head bleed in August, and a left shoulder fracture from a previous fall. FINDINGS: FACIAL BONES: No acute facial fracture. No underlying fracture or foreign body is present in the region of the left supraorbital scalp hematoma. No mandibular dislocation. No suspicious bone lesion. ORBITS: Globes are intact. No acute traumatic injury. No inflammatory change. SINUSES AND MASTOIDS: Minimal mucosal thickening is present in the left maxillary sinus. SOFT TISSUES: Left supraorbital scalp hematoma is present. IMPRESSION: 1. Left supraorbital scalp hematoma without underlying fracture or foreign body. 2. Minimal mucosal thickening in the left maxillary sinus. Electronically signed by: Lonni Necessary MD 12/24/2023 04:04 PM EDT RP Workstation: [id number]   CT Head Wo Contrast Result Date: 12/24/2023 EXAM: CT HEAD WITHOUT CONTRAST 12/24/2023 03:32:00 PM TECHNIQUE: CT of the head was performed without the administration of intravenous contrast. Automated exposure control, iterative reconstruction, and/or weight based adjustment of the mA/kV was utilized to reduce the radiation dose to as low as reasonably achievable. COMPARISON: CT head without contrast 12/08/2023. CLINICAL HISTORY: Mental status change, unknown cause. Chief complaints: Altered Mental Status. Last known well 1045. Presents with AMS, hypoglycemic. Got glucagon  x1 at facility mental status improved. Alert to self. Baseline dysphagia. EMS VS: 196/110 80bpm CBG 296 20RR. GCS 14. Baseline headache since head bleed in August from fall. L shoulder fx from previous fall. FINDINGS: BRAIN AND VENTRICLES: No acute hemorrhage.  No evidence of acute infarct. No hydrocephalus. No extra-axial collection. No mass effect or midline shift. Dense foci of ossification are noted along the tentorium and intracerebral falx. Atrophy and white matter changes are stable. Atherosclerotic calcifications are present in the cavernous carotid arteries bilaterally. No hyperdense vessel is present. ORBITS: No acute abnormality. SINUSES: No acute abnormality. SOFT TISSUES AND SKULL: Left supraorbital scalp hematoma is present without underlying fracture. No skull fracture. IMPRESSION: 1. No acute intracranial abnormality. 2. Left supraorbital scalp hematoma without underlying fracture. Electronically signed by: Lonni Necessary MD 12/24/2023 04:01 PM EDT RP Workstation: [id number]   DG Chest Port 1 View Result Date: 12/24/2023 CLINICAL DATA:  Altered level of consciousness EXAM: PORTABLE CHEST 1 VIEW COMPARISON:  11/15/2023 FINDINGS: Single frontal view of the chest demonstrates mild enlargement of the  cardiac silhouette. There is bibasilar consolidation, greatest in the left lower lobe. Small left pleural effusion is noted. No pneumothorax. There are no acute displaced fractures. Further displacement of the known proximal left humeral fracture since prior exam. Healing right lateral eighth rib fracture with moderate callus formation. No acute fractures. IMPRESSION: 1. Bibasilar consolidation, left greater than right, consistent with infection, aspiration, or asymmetric edema. 2. Small left pleural effusion. 3. Enlarged cardiac silhouette. 4. Further displacement and impaction of the known proximal left humeral fracture. Subacute healing right lateral eighth rib fracture. Electronically Signed   By: Ozell Daring M.D.   On: 12/24/2023 15:10    EKG: Independently reviewed.  Sinus rhythm, short PR interval.  Assessment/Plan Principal Problem:   Acute metabolic encephalopathy Active Problems:   DKA, type 1 (HCC)   Intraventricular hemorrhage  (HCC)   History of substance abuse (HCC)   Orthostatic hypotension   Dyslipidemia   CKD (chronic kidney disease) stage 3, GFR 30-59 ml/min (HCC)   Seizure disorder (HCC)   Multiple myeloma (HCC)   MCI (mild cognitive impairment) with memory loss   Acute encephalopathy likely multifactorial: Could be polypharmacy, Seizure ,  hypoglycemia or UTI. Patient was found to be confused, blood glucose found to be 40. Patient was given glucagon  with slight improvement. CT head / CT C-spine >No acute intracranial abnormality found. Obtain urine and urine culture.  Does not appear infection, no fever or leucocytosis. Obtain urine drug screen, No reports of substance use. Obtain EEG to rule out seizure, hold all sedating medications. Continue seizure precautions, fall precautions, aspiration precautions . NPO for now until awake , then start Clear liquid diet. Neurology consulted,  awaiting recommendation.  Seizure disorder Continue Keppra  IV. Obtain EEG. Seizure precautions, follow-up neurology.  Elevated troponin: EKG shows sinus rhythm, short PR interval, Unable to determine whether he actually has any pain. Continue to trend troponins if elevated consult cardiology. Patient is not a candidate for anticoagulation given history of ICH  Orthostatic hypotension: Patient remains on midodrine  and Florinef . Blood pressure has been elevated.  Hold on both. He may require IV hydralazine  as needed.  CKD stage IIIa: Serum creatinine at baseline.   Avoid nephrotoxic medications  Hyperlipidemia: Resume Lipitor  when patient is more awake and alert.  History of multiple myeloma: Follow-up outpatient.  Mild cognitive impairment: At baseline patient is able to follow commands patient is able to convey needs.  Type 1 diabetes: Hold on insulin  as patient has hypoglycemic episode. Monitor CBG and start sliding scale.  DVT prophylaxis: SCDs Code Status: Full code Family Communication:  Family at bed side Disposition Plan:  Status is: Inpatient Remains inpatient appropriate because: Admitted for altered mental status likely multifactorial, polypharmacy, hypoglycemia, UTI.  Neurology is consulted     Consults called: None Admission status: Inpatient   Darcel Dawley MD [facility]   If 7PM-7AM, please contact night-coverage   12/24/2023, 5:58 PM

## 2023-12-24 NOTE — ED Notes (Signed)
 Pt ate 100% of his meal tray, required moderate assistance

## 2023-12-24 NOTE — ED Notes (Signed)
198 CBG

## 2023-12-24 NOTE — ED Notes (Signed)
 Repeat EKG given to [facility] MD.

## 2023-12-24 NOTE — ED Notes (Signed)
 Pt soiled, pt linen changed, gown changed, cleaned up and new brief placed. Pt dinner tray at bedside, requires assistance eating

## 2023-12-24 NOTE — ED Triage Notes (Signed)
 Last known well 1045. Presents with AMS, hypoglycemic. Got glucagon  x1 at facility mental status improved. Alert to self. Baseline dysphagia.EMS VS :196/110 80bpm CBG 296 20RR. GCS 14. Baseline headache since head bleed in August from fall. L shoulder fx from previous fall.

## 2023-12-24 NOTE — ED Provider Notes (Signed)
 [place] EMERGENCY DEPARTMENT AT [facility] Provider Note   CSN: [id number] Arrival date & time: 12/24/23  1356     Patient presents with: Altered Mental Status (Last known well 1045. Presents with AMS, hypoglycemic. Got glucagon  x1 at facility mental status improved. Alert to self. Baseline dysphagia.EMS VS :196/110 80bpm CBG 296 20RR. GCS 14. Baseline headache since head bleed in August from fall. L shoulder fx from previous fall. /)   Nicholas Weiss is a 73 y.o. male.   73 y.o. male with medical history significant for type I DM, HLD, both subdural and intraventricular hemorrhage.  Patient presents with alteration in mental status.  Patient was noted to be hypoglycemic earlier this morning.  Glucose was 40.  He was treated with glucagon .  Subsequently staff noticed that he still appeared to be altered from his baseline.  He was more confused than typical.  Patient is alert.  He is slow to respond.  He is able to recite his name.  He does not appear to be in distress.  The history is provided by the patient, the EMS personnel and medical records.       Prior to Admission medications   Medication Sig Start Date End Date Taking? Authorizing Provider  acetaminophen  (TYLENOL ) 325 MG tablet Take 2 tablets (650 mg total) by mouth every 6 (six) hours as needed for mild pain (pain score 1-3) or fever. 09/17/23   Josette Ade, MD  acyclovir  (ZOVIRAX ) 400 MG tablet Take 400 mg by mouth in the morning and at bedtime.    [provider]  atorvastatin  (LIPITOR ) 80 MG tablet Take 1 tablet (80 mg total) by mouth at bedtime. Hold while taking Paxlovid  04/08/22   Caleen Qualia, MD  Continuous Glucose Sensor (DEXCOM G7 SENSOR) MISC 1 each by Does not apply route 4 (four) times daily -  before meals and at bedtime. 11/26/23   Caleen Qualia, MD  Fe Fum-Vit C-Vit B12-FA (TRIGELS-F FORTE) CAPS capsule Take 1 capsule by mouth 2 (two) times daily. 11/26/23   Caleen Qualia, MD   fludrocortisone  (FLORINEF ) 0.1 MG tablet Take 1 tablet (0.1 mg total) by mouth daily as needed. Skip the dose if SBP >130 Patient taking differently: Take 0.1 mg by mouth daily. Skip the dose if SBP >130 09/17/23   Josette Ade, MD  gabapentin  (NEURONTIN ) 300 MG capsule Take 1 capsule (300 mg total) by mouth 2 (two) times daily. 09/17/23   Josette Ade, MD  insulin  lispro (HUMALOG) 100 UNIT/ML injection INJECT 150 UNITS EVERY 3 DAYS VIA INSULIN  PUMP AS DIRECTED 10/26/23   [provider]  iron  polysaccharides (NIFEREX) 150 MG capsule Take 150 mg by mouth.    [provider]  latanoprost  (XALATAN ) 0.005 % ophthalmic solution 1 drop at bedtime. 11/02/23   [provider]  levETIRAcetam  (KEPPRA ) 500 MG tablet Take 1 tablet by mouth twice daily 12/15/23   Vaslow, [name] K, MD  midodrine  (PROAMATINE ) 2.5 MG tablet Take 1 tablet (2.5 mg total) by mouth 3 (three) times daily as needed (For systolic blood pressure less than 100). 11/26/23   Amin, Sumayya, MD  Multiple Vitamin (MULTIVITAMIN WITH MINERALS) TABS tablet Take 1 tablet by mouth daily. 11/27/23   Amin, Sumayya, MD  oxyCODONE -acetaminophen  (PERCOCET/ROXICET) 5-325 MG tablet Take 1 tablet by mouth every 6 (six) hours as needed for severe pain (pain score 7-10). 11/26/23   Amin, Sumayya, MD  polyethylene glycol (MIRALAX  / GLYCOLAX ) 17 g packet Take 17 g by mouth daily  as needed for mild constipation. 11/26/23   Amin, Sumayya, MD  POMALYST  2 MG capsule TAKE 1 CAPSULE BY MOUTH EVERY DAY FOR 21 DAYS, THEN HOLD FOR 7 DAYS. REPEAT EVERY 28 DAYS 12/02/23   Melanee Annah BROCKS, MD  risperiDONE  (RISPERDAL ) 0.5 MG tablet Take 1 tablet (0.5 mg total) by mouth every 8 (eight) hours as needed (agitation). 11/26/23   Caleen Qualia, MD    Allergies: Penicillins    Review of Systems  All other systems reviewed and are negative.   Updated Vital Signs Temp 98.3 F (36.8 C) (Oral)   Physical Exam Vitals and nursing note reviewed.   Constitutional:      General: He is not in acute distress.    Appearance: Normal appearance. He is well-developed.  HENT:     Head: Normocephalic and atraumatic.  Eyes:     Conjunctiva/sclera: Conjunctivae normal.     Pupils: Pupils are equal, round, and reactive to light.  Cardiovascular:     Rate and Rhythm: Normal rate and regular rhythm.     Heart sounds: Normal heart sounds.  Pulmonary:     Effort: Pulmonary effort is normal. No respiratory distress.     Breath sounds: Normal breath sounds.  Abdominal:     General: There is no distension.     Palpations: Abdomen is soft.     Tenderness: There is no abdominal tenderness.  Musculoskeletal:        General: No deformity. Normal range of motion.     Cervical back: Normal range of motion and neck supple.  Skin:    General: Skin is warm and dry.  Neurological:     General: No focal deficit present.     Mental Status: He is alert.     Comments: Slow to respond.  I am able to get the patient to recite his name.  Otherwise there is minimal verbal communication.  He is alert and staring around the room.  Family reports that this is close to his mental status baseline after his recent intraventricular hemorrhage.     (all labs ordered are listed, but only abnormal results are displayed) Labs Reviewed  CBG MONITORING, ED - Abnormal; Notable for the following components:      Result Value   Glucose-Capillary 198 (*)    All other components within normal limits  CBC WITH DIFFERENTIAL/PLATELET  COMPREHENSIVE METABOLIC PANEL WITH GFR  ETHANOL  URINALYSIS, W/ REFLEX TO CULTURE (INFECTION SUSPECTED)  RAPID URINE DRUG SCREEN, HOSP PERFORMED  I-STAT VENOUS BLOOD GAS, ED  I-STAT CHEM 8, ED  I-STAT CG4 LACTIC ACID, ED  TROPONIN I (HIGH SENSITIVITY)    EKG: EKG Interpretation Date/Time:  Friday December 24 2023 14:04:44 EDT Ventricular Rate:  87 PR Interval:  54 QRS Duration:  113 QT Interval:  417 QTC Calculation: 502 R  Axis:   73  Text Interpretation: Sinus rhythm Short PR interval Borderline intraventricular conduction delay Low voltage, extremity leads Nonspecific T abnormalities, lateral leads Prolonged QT interval Confirmed by Laurice Coy [phone]) on 12/24/2023 2:10:44 PM  Radiology: No results found.   Procedures   Medications Ordered in the ED - No data to display                                  Medical Decision Making Patient presents with alteration in mental status.  Patient with history of intracranial hemorrhage.  Family reports recent falls. Obtained imaging is  without evidence of acute abnormality.  Screening labs obtained.  Initial troponin is elevated at 110.  EKG does not suggest acute ischemia.  Patient will require admission for further workup.  Hospitalist service made aware of case and will evaluate for same.  Amount and/or Complexity of Data Reviewed Labs: ordered. Radiology: ordered.  Risk Decision regarding hospitalization.        Final diagnoses:  Altered mental status, unspecified altered mental status type  Elevated troponin    ED Discharge Orders     None          Laurice Maude BROCKS, MD 12/25/23 [phone]

## 2023-12-24 NOTE — Consult Note (Signed)
 NEUROLOGY CONSULT NOTE   Date of service: December 24, 2023 Patient Name: Nicholas Weiss MRN:  [id number] DOB:  03/28/1951 Chief Complaint: AMS Requesting Provider: Leotis Bogus, MD  History of Present Illness  Nicholas Weiss is a 73 y.o. male with hx of hyperlipidemia hypertension, type 1 diabetes, A fib not on AC due to recent IVH, recent subdural and IVH s/p fall, seizure disorder,  cognitive impairment presents from his skilled nursing facility for altered mental status.  Per report he was found less responsive and confused.  His blood sugar was 40 and he was given glucagon  which even after given glucagon  and improvement in blood sugar he still appeared to be altered from baseline.  CT head with no acute process. Neurology consulted LKW: 1045 Modified rankin score: 3-Moderate disability-requires help but walks WITHOUT assistance IV Thrombolysis: No not a stroke EVT:  No LVO    NIHSS components Score: Comment  1a Level of Conscious 0[]  1[x]  2[]  3[]      1b LOC Questions 0[]  1[x]  2[]       1c LOC Commands 0[]  1[x]  2[]       2 Best Gaze 0[x]  1[]  2[]       3 Visual 0[x]  1[]  2[]  3[]      4 Facial Palsy 0[x]  1[]  2[]  3[]      5a Motor Arm - left 0[]  1[]  2[]  3[x]  4[]  UN[]  In splint  5b Motor Arm - Right 0[x]  1[]  2[]  3[]  4[]  UN[]    6a Motor Leg - Left 0[]  1[]  2[]  3[x]  4[]  UN[]    6b Motor Leg - Right 0[]  1[]  2[]  3[x]  4[]  UN[]    7 Limb Ataxia 0[x]  1[]  2[]  UN[]      8 Sensory 0[x]  1[]  2[]  UN[]      9 Best Language 0[]  1[x]  2[]  3[]      10 Dysarthria 0[x]  1[]  2[]  UN[]      11 Extinct. and Inattention 0[x]  1[]  2[]       TOTAL: 13      ROS  Comprehensive ROS Unable to ascertain due to AMS  Past History   Past Medical History:  Diagnosis Date   Acute metabolic encephalopathy 12/02/2020   DKA (diabetic ketoacidoses) 04/06/2016   Hypercholesteremia    Hypertension     Past Surgical History:  Procedure Laterality Date   AMPUTATION TOE Left 04/25/2022   Procedure: LEFT GREAT TOE  AMPUTATION AND LEFT PARTIAL 2ND TOE AMPUTATION;  Surgeon: Janit Thresa HERO, DPM;  Location: [facility] ORS;  Service: Podiatry;  Laterality: Left;   COLONOSCOPY WITH PROPOFOL  N/A 02/01/2020   Procedure: COLONOSCOPY WITH PROPOFOL ;  Surgeon: Unk Corinn Skiff, MD;  Location: [facility] ENDOSCOPY;  Service: Gastroenterology;  Laterality: N/A;   ESOPHAGOGASTRODUODENOSCOPY  02/01/2020   Procedure: ESOPHAGOGASTRODUODENOSCOPY (EGD);  Surgeon: Unk Corinn Skiff, MD;  Location: [facility] ENDOSCOPY;  Service: Gastroenterology;;   ESOPHAGOGASTRODUODENOSCOPY (EGD) WITH PROPOFOL  N/A 06/07/2022   Procedure: ESOPHAGOGASTRODUODENOSCOPY (EGD) WITH PROPOFOL ;  Surgeon: Saintclair Jasper, MD;  Location: [facility] ENDOSCOPY;  Service: Gastroenterology;  Laterality: N/A;   KNEE SURGERY Right    Torn meniscus   KNEE SURGERY Left     Family History: Family History  Problem Relation Age of Onset   Heart attack Father    Hypertension Sister    Cancer Sister     Social History  reports that he has never smoked. He has never used smokeless tobacco. He reports that he does not currently use alcohol. He reports current drug use. Drugs: Marijuana and Crack cocaine.  Allergies  Allergen Reactions   Penicillins Other (  See Comments)    Childhood allergy -tolerated amoxil  03/2022 Unknown reaction Has patient had a PCN reaction causing immediate rash, facial/tongue/throat swelling, SOB or lightheadedness with hypotension: No Has patient had a PCN reaction causing severe rash involving mucus membranes or skin necrosis: No Has patient had a PCN reaction that required hospitalization No Has patient had a PCN reaction occurring within the last 10 years: No If all of the above answers are NO, then may proceed with Cephalosporin use.     Medications   Current Facility-Administered Medications:    0.9 %  sodium chloride  infusion, , Intravenous, Continuous, Khatri, Pardeep, MD   acetaminophen  (TYLENOL ) tablet 650 mg, 650 mg, Oral, Q6H PRN **OR**  acetaminophen  (TYLENOL ) suppository 650 mg, 650 mg, Rectal, Q6H PRN, Leotis, Pardeep, MD   atorvastatin  (LIPITOR ) tablet 80 mg, 80 mg, Oral, QHS, Khatri, Pardeep, MD   docusate sodium  (COLACE) capsule 100 mg, 100 mg, Oral, BID, Leotis, Pardeep, MD   hydrALAZINE  (APRESOLINE ) injection 10 mg, 10 mg, Intravenous, Q8H PRN, Leotis, Pardeep, MD   levETIRAcetam  (KEPPRA ) undiluted injection 500 mg, 500 mg, Intravenous, Q12H, Khatri, Pardeep, MD   polyethylene glycol (MIRALAX  / GLYCOLAX ) packet 17 g, 17 g, Oral, Daily PRN, Leotis Bogus, MD  Current Outpatient Medications:    acetaminophen  (TYLENOL ) 325 MG tablet, Take 2 tablets (650 mg total) by mouth every 6 (six) hours as needed for mild pain (pain score 1-3) or fever., Disp: , Rfl:    acyclovir  (ZOVIRAX ) 400 MG tablet, Take 400 mg by mouth in the morning and at bedtime., Disp: , Rfl:    atorvastatin  (LIPITOR ) 80 MG tablet, Take 1 tablet (80 mg total) by mouth at bedtime. Hold while taking Paxlovid , Disp: 30 tablet, Rfl: 2   Fe Fum-Vit C-Vit B12-FA (TRIGELS-F FORTE) CAPS capsule, Take 1 capsule by mouth 2 (two) times daily., Disp: , Rfl:    gabapentin  (NEURONTIN ) 300 MG capsule, Take 1 capsule (300 mg total) by mouth 2 (two) times daily., Disp: 60 capsule, Rfl: 0   insulin  glargine (LANTUS ) 100 UNIT/ML injection, Inject 15 Units into the skin in the morning., Disp: , Rfl:    insulin  lispro (HUMALOG) 100 UNIT/ML injection, Inject 0-12 Units into the skin 4 (four) times daily -  with meals and at bedtime. Inject as per sliding scale: If 0-110 = 0; 111-150 = 2; 151-200 = 4; 201-250 = 6; 301-350 = 8; 351-400 = 10, subcutaneously before meals and bedtime for diabetes. If >400, please give 12 units and contact provider., Disp: , Rfl:    latanoprost  (XALATAN ) 0.005 % ophthalmic solution, Place 1 drop into both eyes at bedtime., Disp: , Rfl:    levETIRAcetam  (KEPPRA ) 500 MG tablet, Take 1 tablet by mouth twice daily, Disp: 60 tablet, Rfl: 0   midodrine   (PROAMATINE ) 2.5 MG tablet, Take 1 tablet (2.5 mg total) by mouth 3 (three) times daily as needed (For systolic blood pressure less than 100). (Patient taking differently: Take 2.5 mg by mouth every 8 (eight) hours as needed (For systolic blood pressure less than 100).), Disp: , Rfl:    Multiple Vitamin (MULTIVITAMIN WITH MINERALS) TABS tablet, Take 1 tablet by mouth daily. (Patient taking differently: Take 1 tablet by mouth in the morning.), Disp: , Rfl:    POMALYST  2 MG capsule, TAKE 1 CAPSULE BY MOUTH EVERY DAY FOR 21 DAYS, THEN HOLD FOR 7 DAYS. REPEAT EVERY 28 DAYS (Patient taking differently: Take 2 mg by mouth See admin instructions. Take 1 capsule by mouth every day  for 21 days, then hold for 7 days. Repeat every 28 days.), Disp: 21 capsule, Rfl: 0   Continuous Glucose Sensor (DEXCOM G7 SENSOR) MISC, 1 each by Does not apply route 4 (four) times daily -  before meals and at bedtime., Disp: , Rfl:   Facility-Administered Medications Ordered in Other Encounters:    epoetin  alfa-epbx (RETACRIT ) injection 40,000 Units, 40,000 Units, Subcutaneous, Q21 days, Melanee Annah BROCKS, MD, 40,000 Units at 07/09/23 1153  Vitals   Vitals:   Dec 31, 2023 1406 31-Dec-2023 1413 December 31, 2023 1415 12/31/2023 1500  BP:  (!) 202/127 (!) 179/112 (!) 194/117  Pulse:  88 84 85  Resp:  19 13 12   Temp: 98.3 F (36.8 C)     TempSrc: Oral     SpO2:  100% 100% 100%    There is no height or weight on file to calculate BMI.   Physical Exam   Constitutional: Appears well-developed and well-nourished.  Psych: Flat affect Eyes: No scleral injection.  Purpleish hematoma over left eye HENT: No OP obstruction.  Edentulous Head: Normocephalic.  Cardiovascular: Normal rate and regular rhythm.  Respiratory: Effort normal, non-labored breathing.  GI: Soft.  No distension. There is no tenderness.  Skin: WDI.  Ext: Missing some digits on left foot, left hand swollen, mild edema in bilateral feet Neurologic Examination   Mental Status  -  Patient is drowsy.  He does awaken after repetitive stimulation.  He is oriented to himself he stated his age is 83.  Unable to state current month, year or place.  Poor attention.  Very slow and delayed in responses  Cranial Nerves II - XII - II - Visual field intact to threat bilaterally III, IV, VI - Extraocular movements intact. V - Facial sensation intact bilaterally. VII - Facial movement intact bilaterally. VIII - Hearing & vestibular intact bilaterally. X - Palate elevates symmetrically. XI - Chin turning & shoulder shrug intact bilaterally. XII - Tongue protrusion intact.  Motor Strength -right arm patient is able to hold off of bed without drift, left arm with some slight horizontal movement, he tells me he has pain with that arm,  weak grip on the left hand, bilateral legs he does not lift off bed but does have some horizontal movement.  Bulk was normal and fasciculations were absent.   Motor Tone - Muscle tone was assessed at the neck and appendages and was normal. Sensory -grimaces to painful stimuli in all 4 remedies Coordination -unable to assess Gait and Station - deferred.  Labs/Imaging/Neurodiagnostic studies   CBC:  Recent Labs  Lab 12-31-23 1437 2023/12/31 1447  WBC 3.9*  --   NEUTROABS 2.2  --   HGB 8.0* 8.8*  8.8*  HCT 25.1* 26.0*  26.0*  MCV 101.6*  --   PLT 253  --    Basic Metabolic Panel:  Lab Results  Component Value Date   NA 138 December 31, 2023   NA 138 2023/12/31   K 4.5 December 31, 2023   K 4.5 12-31-23   CO2 22 Dec 31, 2023   GLUCOSE 214 (H) 2023/12/31   BUN 32 (H) December 31, 2023   CREATININE 1.10 12-31-23   CALCIUM  8.2 (L) 12/31/2023   GFRNONAA >60 12-31-23   GFRAA 54 (L) 03/15/2020   Lipid Panel:  Lab Results  Component Value Date   LDLCALC 44 11/16/2023   HgbA1c:  Lab Results  Component Value Date   HGBA1C 10.0 (H) 07/21/2023   Urine Drug Screen:     Component Value Date/Time   LABOPIA NONE DETECTED  09/16/2023 1821    COCAINSCRNUR NONE DETECTED 09/16/2023 1821   LABBENZ NONE DETECTED 09/16/2023 1821   AMPHETMU NONE DETECTED 09/16/2023 1821   THCU NONE DETECTED 09/16/2023 1821   LABBARB NONE DETECTED 09/16/2023 1821    Alcohol Level     Component Value Date/Time   ETH <15 12/24/2023 1437   INR  Lab Results  Component Value Date   INR 1.3 (H) 11/16/2023   APTT  Lab Results  Component Value Date   APTT 111 (H) 08/16/2023   AED levels:  Lab Results  Component Value Date   LEVETIRACETA 31.5 08/26/2023    CT Head without contrast(Personally reviewed): 1. No acute intracranial abnormality. 2. Left supraorbital scalp hematoma without underlying fracture    ASSESSMENT   Nicholas Weiss is a 73 y.o. male with hx of hyperlipidemia hypertension, type 1 diabetes, recent subdural and IVH s/p fall, seizure disorder presents from his skilled nursing facility for altered mental status.  Per report he was found less responsive and confused.  His blood sugar was 40 and he was given glucagon  which even after given glucagon  and improvement in blood sugar he still appeared to be altered from baseline.  CT head with no acute process.  Etiology of his confusion is somewhat unclear.  He appears to be improved on my evaluation and following commands and answering questions.  It may be possible that this is just delayed clearing for the confusion in the setting of underlying dementia or potentially secondary to toxic metabolic etiologies including vitamin deficiency especially given noted megaloblastic anemia on CBC.  RECOMMENDATIONS  Seizure precautions Check routine EEG to evaluate for seizures Continue home Keppra  500 mg twice daily Evaluate for infection: Check UA,  chest x-ray, CBC Check UDS Check MRI brain without to evaluate for acute process Check thiamine , B12 and B6  Neurology will continue to follow ______________________________________________________________________    Signed, Karna DELENA Geralds, NP [facility]   NEUROHOSPITALIST ADDENDUM Performed a face to face diagnostic evaluation.   I have reviewed the contents of history and physical exam as documented by PA/ARNP/Resident and agree with above documentation.  I have discussed and formulated the above plan as documented. Edits to the note have been made as needed.  Impression/Key exam findings/Plan:  More awake and following commands, still has delayed responses and disoriented to month and year.  It may be that disorientation to month and age and year is baseline for him secondary to his dementia.  I attempted but was unable to get in touch with the staff at [facility] care center or with patient's family.  Nesa Distel, MD [facility] [phone]   If 7pm to 7am, please call on call as listed on AMION.

## 2023-12-24 NOTE — ED Notes (Signed)
 Pt brief changed.

## 2023-12-25 ENCOUNTER — Inpatient Hospital Stay (HOSPITAL_COMMUNITY)

## 2023-12-25 DIAGNOSIS — G9341 Metabolic encephalopathy: Secondary | ICD-10-CM | POA: Diagnosis not present

## 2023-12-25 DIAGNOSIS — R569 Unspecified convulsions: Secondary | ICD-10-CM

## 2023-12-25 DIAGNOSIS — E10649 Type 1 diabetes mellitus with hypoglycemia without coma: Principal | ICD-10-CM

## 2023-12-25 DIAGNOSIS — R4182 Altered mental status, unspecified: Secondary | ICD-10-CM | POA: Diagnosis not present

## 2023-12-25 DIAGNOSIS — G9340 Encephalopathy, unspecified: Secondary | ICD-10-CM

## 2023-12-25 LAB — TSH: TSH: 4.132 u[IU]/mL (ref 0.350–4.500)

## 2023-12-25 LAB — CBG MONITORING, ED
Glucose-Capillary: 80 mg/dL (ref 70–99)
Glucose-Capillary: 90 mg/dL (ref 70–99)

## 2023-12-25 LAB — COMPREHENSIVE METABOLIC PANEL WITH GFR
ALT: 31 U/L (ref 0–44)
AST: 24 U/L (ref 15–41)
Albumin: 2.4 g/dL — ABNORMAL LOW (ref 3.5–5.0)
Alkaline Phosphatase: 96 U/L (ref 38–126)
Anion gap: 8 (ref 5–15)
BUN: 18 mg/dL (ref 8–23)
CO2: 22 mmol/L (ref 22–32)
Calcium: 8.2 mg/dL — ABNORMAL LOW (ref 8.9–10.3)
Chloride: 101 mmol/L (ref 98–111)
Creatinine, Ser: 1.07 mg/dL (ref 0.61–1.24)
GFR, Estimated: >60 mL/min (ref >60)
Glucose, Bld: 68 mg/dL — ABNORMAL LOW (ref 70–99)
Potassium: 4.1 mmol/L (ref 3.5–5.1)
Sodium: 131 mmol/L — ABNORMAL LOW (ref 135–145)
Total Bilirubin: 0.4 mg/dL (ref 0.0–1.2)
Total Protein: 7.5 g/dL (ref 6.5–8.1)

## 2023-12-25 LAB — CBC
HCT: 26.9 % — ABNORMAL LOW (ref 39.0–52.0)
Hemoglobin: 8.2 g/dL — ABNORMAL LOW (ref 13.0–17.0)
MCH: 30.9 pg (ref 26.0–34.0)
MCHC: 30.5 g/dL (ref 30.0–36.0)
MCV: 101.5 fL — ABNORMAL HIGH (ref 80.0–100.0)
Platelets: 269 K/uL (ref 150–400)
RBC: 2.65 MIL/uL — ABNORMAL LOW (ref 4.22–5.81)
RDW: 15.9 % — ABNORMAL HIGH (ref 11.5–15.5)
WBC: 3.6 K/uL — ABNORMAL LOW (ref 4.0–10.5)
nRBC: 0.6 % — ABNORMAL HIGH (ref 0.0–0.2)

## 2023-12-25 LAB — PHOSPHORUS: Phosphorus: 3 mg/dL (ref 2.5–4.6)

## 2023-12-25 LAB — RPR: RPR Ser Ql: NONREACTIVE

## 2023-12-25 LAB — AMMONIA: Ammonia: 48 umol/L — ABNORMAL HIGH (ref 9–35)

## 2023-12-25 LAB — HIV ANTIBODY (ROUTINE TESTING W REFLEX): HIV Screen 4th Generation wRfx: NONREACTIVE

## 2023-12-25 LAB — MAGNESIUM: Magnesium: 1.6 mg/dL — ABNORMAL LOW (ref 1.7–2.4)

## 2023-12-25 LAB — VITAMIN B12: Vitamin B-12: 608 pg/mL (ref 180–914)

## 2023-12-25 LAB — GLUCOSE, CAPILLARY: Glucose-Capillary: 116 mg/dL — ABNORMAL HIGH (ref 70–99)

## 2023-12-25 LAB — FOLATE: Folate: >20 ng/mL (ref >5.9)

## 2023-12-25 MED ORDER — THIAMINE HCL 100 MG/ML IJ SOLN
100.0000 mg | Freq: Every day | INTRAMUSCULAR | Status: DC
Start: 2024-01-02 — End: 2023-12-28

## 2023-12-25 MED ORDER — MAGNESIUM SULFATE 2 GM/50ML IV SOLN
2.0000 g | Freq: Once | INTRAVENOUS | Status: AC
  Administered 2023-12-25: 2 g via INTRAVENOUS
  Filled 2023-12-25: qty 50

## 2023-12-25 MED ORDER — DEXTROSE 5 % IV SOLN: INTRAVENOUS | Status: AC

## 2023-12-25 MED ORDER — THIAMINE HCL 100 MG/ML IJ SOLN
500.0000 mg | Freq: Three times a day (TID) | INTRAMUSCULAR | Status: AC
  Administered 2023-12-25 – 2023-12-26 (×6): 500 mg via INTRAVENOUS
  Filled 2023-12-25: qty 500
  Filled 2023-12-25 (×3): qty 5
  Filled 2023-12-25: qty 500
  Filled 2023-12-25 (×2): qty 5

## 2023-12-25 MED ORDER — THIAMINE HCL 100 MG/ML IJ SOLN
250.0000 mg | Freq: Every day | INTRAVENOUS | Status: DC
  Administered 2023-12-27 – 2023-12-28 (×2): 250 mg via INTRAVENOUS
  Filled 2023-12-25 (×2): qty 2.5

## 2023-12-25 NOTE — Progress Notes (Signed)
 Routine EEG completed, results pending Neurology review and interpretation

## 2023-12-25 NOTE — ED Notes (Signed)
 Pt still comfortable in bed. O2 100% and RR 14, pt snoring. Pt responds to pain, light sternal rub, by opening his eyes for a few seconds. Pt not fully waking up to communicate with this RN. No response when asking the pt his name or if he has any pain. Pt just seems very sleepy.

## 2023-12-25 NOTE — Progress Notes (Signed)
 Pt arrived on unit, got temp/vitals. Patient is speaking his name, and her partner's [name]'s name, but not aware of date/loc. Patient is verbal which is a step up compared to ED.

## 2023-12-25 NOTE — ED Notes (Signed)
 Report given to rn tim on 3e

## 2023-12-25 NOTE — Procedures (Signed)
 Routine EEG Report  Nicholas Weiss is a 73 y.o. male with a history of altered mental status who is undergoing an EEG to evaluate for seizures.  Report: This EEG was acquired with electrodes placed according to the International 10-20 electrode system (including Fp1, Fp2, F3, F4, C3, C4, P3, P4, O1, O2, T3, T4, T5, T6, A1, A2, Fz, Cz, Pz). The following electrodes were missing or displaced: none.  The occipital dominant rhythm was 5-6 Hz. This activity is reactive to stimulation. Drowsiness was manifested by background fragmentation; deeper stages of sleep were identified by K complexes and sleep spindles. There was no focal slowing. There were no interictal epileptiform discharges. There were no electrographic seizures identified. There was no abnormal response to photic stimulation or hyperventilation.   Impression and clinical correlation: This EEG was obtained while awake and asleep and is abnormal due to moderate diffuse slowing indicative of global cerebral dysfunction. Epileptiform abnormalities were not seen during this recording.  Elida Ross, MD [facility] [phone]  If 7pm- 7am, please page neurology on call as listed in AMION.

## 2023-12-25 NOTE — ED Notes (Addendum)
 Pt brief changed, gown changed, barrier cream applied. IV infiltrated with NS. IV removed, warm compress placed on pt arm. Pt denies pain

## 2023-12-25 NOTE — ED Notes (Signed)
 Dr michaela just saw the pt  alert for now

## 2023-12-25 NOTE — Progress Notes (Signed)
 NEUROLOGY CONSULT FOLLOW UP NOTE   Date of service: December 25, 2023 Patient Name: Nicholas Weiss MRN:  [id number] DOB:  1950/06/28  Interval Hx/subjective     Vitals   Vitals:   12/25/23 1320 12/25/23 1632 12/25/23 1700 12/25/23 1922  BP: (!) 156/88  (!) 170/103 (!) 140/67  Pulse: 82  90 88  Resp: 15  12 15   Temp:  98.2 F (36.8 C) 98.3 F (36.8 C) 97.7 F (36.5 C)  TempSrc:   Oral Oral  SpO2: 100%  100% 99%     There is no height or weight on file to calculate BMI.  Physical Exam   Constitutional: Appears well-developed and well-nourished.  Neurologic Examination    MS: He is very sleepy, refuses to interact with me, tells me to go away. CN: He keeps his eyes tightly shut, despite me trying to open them, precluding visual field assessment, strength is relatively symmetric. Motor: Moves all extremities well Sensory: Intact to light touch  Medications  Current Facility-Administered Medications:    acetaminophen  (TYLENOL ) tablet 650 mg, 650 mg, Oral, Q6H PRN **OR** acetaminophen  (TYLENOL ) suppository 650 mg, 650 mg, Rectal, Q6H PRN, Leotis, Pardeep, MD   atorvastatin  (LIPITOR ) tablet 80 mg, 80 mg, Oral, QHS, Khatri, Pardeep, MD, 80 mg at 12/24/23 2112   dextrose  5 % solution, , Intravenous, Continuous, Leotis, Pardeep, MD, Last Rate: 40 mL/hr at 12/25/23 1443, Restarted at 12/25/23 1443   docusate sodium  (COLACE) capsule 100 mg, 100 mg, Oral, BID, Leotis, Pardeep, MD, 100 mg at 12/24/23 2112   hydrALAZINE  (APRESOLINE ) injection 10 mg, 10 mg, Intravenous, Q8H PRN, Leotis Bogus, MD, 10 mg at 12/24/23 2114   levETIRAcetam  (KEPPRA ) undiluted injection 500 mg, 500 mg, Intravenous, Q12H, Khatri, Pardeep, MD, 500 mg at 12/25/23 1846   polyethylene glycol (MIRALAX  / GLYCOLAX ) packet 17 g, 17 g, Oral, Daily PRN, Leotis Bogus, MD   thiamine  (VITAMIN B1) 500 mg in sodium chloride  0.9 % 50 mL IVPB, 500 mg, Intravenous, Q8H, Stopped at 12/25/23 1442 **FOLLOWED BY** [START  ON 12/27/2023] thiamine  (VITAMIN B1) 250 mg in sodium chloride  0.9 % 50 mL IVPB, 250 mg, Intravenous, Daily **FOLLOWED BY** [START ON 01/02/2024] thiamine  (VITAMIN B1) injection 100 mg, 100 mg, Intravenous, Daily, Khaliqdina, Salman, MD  Facility-Administered Medications Ordered in Other Encounters:    epoetin  alfa-epbx (RETACRIT ) injection 40,000 Units, 40,000 Units, Subcutaneous, Q21 days, Melanee Annah BROCKS, MD, 40,000 Units at 07/09/23 1153  Labs and Diagnostic Imaging   B12 608 Folate normal TSH 4.1 Ammonia  48  Imaging(Personally reviewed): CT head is negative  EEG is negative  MRI is pending  Assessment   JAYMIE MISCH is a 73 y.o. male with encephalopathy in the setting of low blood glucose, my suspicion is this represents simply low blood glucose.  He has been in the emergency department without one dose for quite some time, and is at risk of developing delirium.  Recommendations  MRI brain Continue to monitor ______________________________________________________________________   Signed, Aisha Seals, MD [facility]

## 2023-12-25 NOTE — Progress Notes (Signed)
 PROGRESS NOTE    Nicholas Weiss  FMW:[id number] DOB: 09-03-1950 DOA: 12/24/2023 PCP: Alla Amis, MD   Brief Narrative:  This 73 yrs old Male with PMH significant for type 1 diabetes, hyperlipidemia, both subdural and intraventricular hemorrhage, Seizure disorder was sent from skilled nursing facility for altered mental status.  Patient was found to be confused, barely responsive.  Last known well time 10:45 AM.  He was found to be hypoglycemic,  blood glucose was 40,  Patient was given glucagon , subsequently staff noticed he still appeared to be altered from his baseline.  At baseline he is slow to respond but following commands.  Nursing home staff denies any fever, sore throat, cough, nausea, vomiting, or other sick contacts.  CT head: No acute intracranial abnormality. Left supraorbital scalp hematoma without underlying fracture.  CT C-spine :  No acute abnormality of the cervical spine.  Patient was admitted for further evaluation.  Neurology is consulted.  Assessment & Plan:   Principal Problem:   Acute metabolic encephalopathy Active Problems:   DKA, type 1 (HCC)   Intraventricular hemorrhage (HCC)   History of substance abuse (HCC)   Orthostatic hypotension   Dyslipidemia   CKD (chronic kidney disease) stage 3, GFR 30-59 ml/min (HCC)   Seizure disorder (HCC)   Multiple myeloma (HCC)   MCI (mild cognitive impairment) with memory loss   Acute encephalopathy likely multifactorial: Could be polypharmacy, Seizure ,  hypoglycemia or UTI. Patient was found to be confused, blood glucose found to be 40. Patient was given glucagon  with slight improvement. CT head / CT C-spine >No acute intracranial abnormality found. Obtain urine and urine culture.  Does not appear infection, no fever or leucocytosis. Obtain urine drug screen, No reports of substance use. Obtain EEG to rule out seizure, hold all sedating medications. Continue seizure precautions, fall precautions, aspiration  precautions . NPO for now until awake , then start Clear liquid diet. Neurology consulted,   MRI Brain, Check B 12, Folate, B6   Seizure disorder Continue Keppra  IV. EEG in progress. Seizure precautions, follow-up neurology.   Elevated troponin: EKG shows sinus rhythm, short PR interval, Unable to determine whether he actually has any pain. Troponin trended down 110> 79, likely demand ischemia. Patient is not a candidate for anticoagulation given history of ICH   Orthostatic hypotension: Patient remains on midodrine  and Florinef . Blood pressure has been elevated.  Hold on both. He may require IV hydralazine  as needed.   CKD stage IIIa: Serum creatinine at baseline.   Avoid nephrotoxic medications   Hyperlipidemia: Resume Lipitor  when patient is more awake and alert.   History of multiple myeloma: Follow-up outpatient.   Mild cognitive impairment: At baseline patient is able to follow commands, Patient is able to convey needs.   Type 1 diabetes with hypoglycemia: Hold on insulin  as patient has hypoglycemic episode. Monitor CBG and start sliding scale. Start D5W @ 40 cc/hr   DVT prophylaxis: SCDs Code Status: Full code Family Communication:No family at bed side Disposition Plan:     Status is: Inpatient Remains inpatient appropriate because: Severity of illness / Workup in progress.   Consultants:  Neurology  Procedures: CT Head / MRI.  Antimicrobials:  Anti-infectives (From admission, onward)    None      Subjective: Patient was seen and examined at bedside.  Overnight events noted. Patient is arousable, responds on calling his name and then go back to sleep. RN reported patient has eaten most of his meal.   Objective: Vitals:  12/25/23 0418 12/25/23 0500 12/25/23 0600 12/25/23 0832  BP:  (!) 166/97 (!) 166/89   Pulse:  96 86   Resp:  13 15   Temp: 98.1 F (36.7 C)   98.5 F (36.9 C)  TempSrc: Oral   Axillary  SpO2:  99% 100%      Intake/Output Summary (Last 24 hours) at 12/25/2023 0925 Last data filed at 12/25/2023 0418 Gross per 24 hour  Intake 331.27 ml  Output --  Net 331.27 ml   There were no vitals filed for this visit.  Examination:  General exam: Appears calm and comfortable, arousable, not in any acute distress. Respiratory system: CTA Bilaterally. Respiratory effort normal.  RR 14 Cardiovascular system: S1 & S2 heard, RRR. No JVD, murmurs, rubs, gallops or clicks.  Gastrointestinal system: Abdomen is non distended, soft and non tender.  Normal bowel sounds heard. Central nervous system: Alert and oriented x 1. No focal neurological deficits. Extremities: No edema, no cyanosis, no clubbing. Skin: No rashes, lesions or ulcers Psychiatry:  Mood & affect appropriate.    Data Reviewed: I have personally reviewed following labs and imaging studies  CBC: Recent Labs  Lab 12/24/23 1437 12/24/23 1447 12/25/23 0455  WBC 3.9*  --  3.6*  NEUTROABS 2.2  --   --   HGB 8.0* 8.8*  8.8* 8.2*  HCT 25.1* 26.0*  26.0* 26.9*  MCV 101.6*  --  101.5*  PLT 253  --  269   Basic Metabolic Panel: Recent Labs  Lab 12/24/23 1437 12/24/23 1447 12/25/23 0455  NA 135 138  138 131*  K 4.2 4.5  4.5 4.1  CL 104 104 101  CO2 22  --  22  GLUCOSE 202* 214* 68*  BUN 27* 32* 18  CREATININE 1.05 1.10 1.07  CALCIUM  8.2*  --  8.2*  MG  --   --  1.6*  PHOS  --   --  3.0   GFR: CrCl cannot be calculated (Unknown ideal weight.). Liver Function Tests: Recent Labs  Lab 12/24/23 1437 12/25/23 0455  AST 31 24  ALT 39 31  ALKPHOS 97 96  BILITOT 0.5 0.4  PROT 8.0 7.5  ALBUMIN 2.6* 2.4*   No results for input(s): LIPASE, AMYLASE in the last 168 hours. Recent Labs  Lab 12/25/23 0455  AMMONIA  48*   Coagulation Profile: No results for input(s): INR, PROTIME in the last 168 hours. Cardiac Enzymes: No results for input(s): CKTOTAL, CKMB, CKMBINDEX, TROPONINI in the last 168 hours. BNP (last  3 results) No results for input(s): PROBNP in the last 8760 hours. HbA1C: No results for input(s): HGBA1C in the last 72 hours. CBG: Recent Labs  Lab 12/24/23 1406  GLUCAP 198*   Lipid Profile: No results for input(s): CHOL, HDL, LDLCALC, TRIG, CHOLHDL, LDLDIRECT in the last 72 hours. Thyroid  Function Tests: Recent Labs    12/25/23 0455  TSH 4.132   Anemia Panel: Recent Labs    12/25/23 0455  VITAMINB12 608  FOLATE >20.0   Sepsis Labs: Recent Labs  Lab 12/24/23 1450  LATICACIDVEN 1.4    No results found for this or any previous visit (from the past 240 hours).   Radiology Studies: CT Cervical Spine Wo Contrast Result Date: 12/24/2023 EXAM: CT CERVICAL SPINE WITHOUT CONTRAST 12/24/2023 03:32:00 PM TECHNIQUE: CT of the cervical spine was performed without the administration of intravenous contrast. Multiplanar reformatted images are provided for review. Automated exposure control, iterative reconstruction, and/or weight based adjustment of the mA/kV was utilized  to reduce the radiation dose to as low as reasonably achievable. COMPARISON: CT of the cervical spine 11/15/2023. CLINICAL HISTORY: Neck trauma (Age >= 65y). Chief complaints include altered mental status, hypoglycemic, and baseline dysphagia. The patient has a baseline headache since a head bleed in August from a fall and a left shoulder fracture from a previous fall. FINDINGS: CERVICAL SPINE: BONES AND ALIGNMENT: No acute fracture or traumatic malalignment. DEGENERATIVE CHANGES: Multilevel degenerative changes are again noted in the cervical spine. SOFT TISSUES: No prevertebral soft tissue swelling. Atherosclerotic calcifications are present at the carotid bifurcations bilaterally without significant change. Stenosis is suspected. IMPRESSION: 1. No acute abnormality of the cervical spine related to the reported neck trauma. 2. Multilevel degenerative changes in the cervical spine. Electronically signed by:  Lonni Necessary MD 12/24/2023 04:08 PM EDT RP Workstation: [id number]   CT Maxillofacial Wo Contrast Result Date: 12/24/2023 EXAM: CT OF THE FACE WITHOUT CONTRAST 12/24/2023 03:32:00 PM TECHNIQUE: CT of the face was performed without the administration of intravenous contrast. Multiplanar reformatted images are provided for review. Automated exposure control, iterative reconstruction, and/or weight based adjustment of the mA/kV was utilized to reduce the radiation dose to as low as reasonably achievable. COMPARISON: CT head without contrast 12/08/2023. CLINICAL HISTORY: Facial trauma, blunt. Altered mental status, hypoglycemic, improved with glucagon . Baseline dysphagia, headache from a head bleed in August, and a left shoulder fracture from a previous fall. FINDINGS: FACIAL BONES: No acute facial fracture. No underlying fracture or foreign body is present in the region of the left supraorbital scalp hematoma. No mandibular dislocation. No suspicious bone lesion. ORBITS: Globes are intact. No acute traumatic injury. No inflammatory change. SINUSES AND MASTOIDS: Minimal mucosal thickening is present in the left maxillary sinus. SOFT TISSUES: Left supraorbital scalp hematoma is present. IMPRESSION: 1. Left supraorbital scalp hematoma without underlying fracture or foreign body. 2. Minimal mucosal thickening in the left maxillary sinus. Electronically signed by: Lonni Necessary MD 12/24/2023 04:04 PM EDT RP Workstation: [id number]   CT Head Wo Contrast Result Date: 12/24/2023 EXAM: CT HEAD WITHOUT CONTRAST 12/24/2023 03:32:00 PM TECHNIQUE: CT of the head was performed without the administration of intravenous contrast. Automated exposure control, iterative reconstruction, and/or weight based adjustment of the mA/kV was utilized to reduce the radiation dose to as low as reasonably achievable. COMPARISON: CT head without contrast 12/08/2023. CLINICAL HISTORY: Mental status change, unknown cause. Chief  complaints: Altered Mental Status. Last known well 1045. Presents with AMS, hypoglycemic. Got glucagon  x1 at facility mental status improved. Alert to self. Baseline dysphagia. EMS VS: 196/110 80bpm CBG 296 20RR. GCS 14. Baseline headache since head bleed in August from fall. L shoulder fx from previous fall. FINDINGS: BRAIN AND VENTRICLES: No acute hemorrhage. No evidence of acute infarct. No hydrocephalus. No extra-axial collection. No mass effect or midline shift. Dense foci of ossification are noted along the tentorium and intracerebral falx. Atrophy and white matter changes are stable. Atherosclerotic calcifications are present in the cavernous carotid arteries bilaterally. No hyperdense vessel is present. ORBITS: No acute abnormality. SINUSES: No acute abnormality. SOFT TISSUES AND SKULL: Left supraorbital scalp hematoma is present without underlying fracture. No skull fracture. IMPRESSION: 1. No acute intracranial abnormality. 2. Left supraorbital scalp hematoma without underlying fracture. Electronically signed by: Lonni Necessary MD 12/24/2023 04:01 PM EDT RP Workstation: [id number]   DG Chest Port 1 View Result Date: 12/24/2023 CLINICAL DATA:  Altered level of consciousness EXAM: PORTABLE CHEST 1 VIEW COMPARISON:  11/15/2023 FINDINGS: Single frontal view of the chest demonstrates mild  enlargement of the cardiac silhouette. There is bibasilar consolidation, greatest in the left lower lobe. Small left pleural effusion is noted. No pneumothorax. There are no acute displaced fractures. Further displacement of the known proximal left humeral fracture since prior exam. Healing right lateral eighth rib fracture with moderate callus formation. No acute fractures. IMPRESSION: 1. Bibasilar consolidation, left greater than right, consistent with infection, aspiration, or asymmetric edema. 2. Small left pleural effusion. 3. Enlarged cardiac silhouette. 4. Further displacement and impaction of the known proximal  left humeral fracture. Subacute healing right lateral eighth rib fracture. Electronically Signed   By: Ozell Daring M.D.   On: 12/24/2023 15:10   Scheduled Meds:  atorvastatin   80 mg Oral QHS   docusate sodium   100 mg Oral BID   levETIRAcetam   500 mg Intravenous Q12H   [START ON 01/02/2024] thiamine  (VITAMIN B1) injection  100 mg Intravenous Daily   Continuous Infusions:  sodium chloride  40 mL/hr at 12/25/23 9461   magnesium  sulfate bolus IVPB     thiamine  (VITAMIN B1) injection 500 mg (12/25/23 0700)   Followed by   NOREEN ON 12/27/2023] thiamine  (VITAMIN B1) injection       LOS: 1 day    Time spent: 50 mins    Darcel Dawley, MD [facility]   If 7PM-7AM, please contact night-coverage

## 2023-12-25 NOTE — ED Notes (Signed)
 Pt not alert enough to give oral medication.

## 2023-12-26 DIAGNOSIS — G9341 Metabolic encephalopathy: Secondary | ICD-10-CM | POA: Diagnosis not present

## 2023-12-26 LAB — CBC
HCT: 23.9 % — ABNORMAL LOW (ref 39.0–52.0)
Hemoglobin: 8 g/dL — ABNORMAL LOW (ref 13.0–17.0)
MCH: 32.1 pg (ref 26.0–34.0)
MCHC: 33.5 g/dL (ref 30.0–36.0)
MCV: 96 fL (ref 80.0–100.0)
Platelets: 284 K/uL (ref 150–400)
RBC: 2.49 MIL/uL — ABNORMAL LOW (ref 4.22–5.81)
RDW: 15.9 % — ABNORMAL HIGH (ref 11.5–15.5)
WBC: 3.5 K/uL — ABNORMAL LOW (ref 4.0–10.5)
nRBC: 0 % (ref 0.0–0.2)

## 2023-12-26 LAB — BASIC METABOLIC PANEL WITH GFR
Anion gap: 10 (ref 5–15)
BUN: 20 mg/dL (ref 8–23)
CO2: 21 mmol/L — ABNORMAL LOW (ref 22–32)
Calcium: 8 mg/dL — ABNORMAL LOW (ref 8.9–10.3)
Chloride: 102 mmol/L (ref 98–111)
Creatinine, Ser: 1.16 mg/dL (ref 0.61–1.24)
GFR, Estimated: >60 mL/min (ref >60)
Glucose, Bld: 194 mg/dL — ABNORMAL HIGH (ref 70–99)
Potassium: 4.2 mmol/L (ref 3.5–5.1)
Sodium: 133 mmol/L — ABNORMAL LOW (ref 135–145)

## 2023-12-26 LAB — PHOSPHORUS: Phosphorus: 3.5 mg/dL (ref 2.5–4.6)

## 2023-12-26 LAB — GLUCOSE, CAPILLARY
Glucose-Capillary: 263 mg/dL — ABNORMAL HIGH (ref 70–99)
Glucose-Capillary: 355 mg/dL — ABNORMAL HIGH (ref 70–99)
Glucose-Capillary: 429 mg/dL — ABNORMAL HIGH (ref 70–99)

## 2023-12-26 LAB — MAGNESIUM: Magnesium: 1.9 mg/dL (ref 1.7–2.4)

## 2023-12-26 LAB — AMMONIA: Ammonia: 18 umol/L (ref 9–35)

## 2023-12-26 MED ORDER — INSULIN ASPART 100 UNIT/ML IJ SOLN
0.0000 [IU] | INTRAMUSCULAR | Status: DC
  Administered 2023-12-26: 6 [IU] via SUBCUTANEOUS
  Administered 2023-12-27 (×2): 1 [IU] via SUBCUTANEOUS
  Administered 2023-12-27: 2 [IU] via SUBCUTANEOUS
  Administered 2023-12-28: 3 [IU] via SUBCUTANEOUS
  Administered 2023-12-28: 1 [IU] via SUBCUTANEOUS
  Administered 2023-12-28: 2 [IU] via SUBCUTANEOUS
  Administered 2023-12-29: 1 [IU] via SUBCUTANEOUS

## 2023-12-26 NOTE — Progress Notes (Signed)
 PROGRESS NOTE    Nicholas Weiss  FMW:[id number] DOB: Jul 27, 1950 DOA: 12/24/2023 PCP: Alla Amis, MD   Brief Narrative:  This 73 yrs old Male with PMH significant for type 1 diabetes, hyperlipidemia, both subdural and intraventricular hemorrhage, Seizure disorder was sent from skilled nursing facility for altered mental status.  Patient was found to be confused, barely responsive.  Last known well time 10:45 AM.  He was found to be hypoglycemic,  blood glucose was 40,  Patient was given glucagon , subsequently staff noticed he still appeared to be altered from his baseline.  At baseline he is slow to respond but following commands.  Nursing home staff denies any fever, sore throat, cough, nausea, vomiting, or other sick contacts.  CT head: No acute intracranial abnormality. Left supraorbital scalp hematoma without underlying fracture.  CT C-spine :  No acute abnormality of the cervical spine.  Patient was admitted for further evaluation.  Neurology is consulted.  Assessment & Plan:   Principal Problem:   Acute metabolic encephalopathy Active Problems:   DKA, type 1 (HCC)   Intraventricular hemorrhage (HCC)   History of substance abuse (HCC)   Orthostatic hypotension   Dyslipidemia   CKD (chronic kidney disease) stage 3, GFR 30-59 ml/min (HCC)   Seizure disorder (HCC)   Multiple myeloma (HCC)   MCI (mild cognitive impairment) with memory loss   Acute encephalopathy likely multifactorial: Could be polypharmacy, Seizure ,  hypoglycemia or UTI. Patient was found to be confused, blood glucose found to be 40. Patient was given glucagon  with slight improvement. CT head / CT C-spine >No acute intracranial abnormality found. Obtain urine and urine culture.  Does not appear infection, no fever or leucocytosis. Obtain urine drug screen, No reports of substance use. EEG >moderate diffuse slowing indicative of global cerebral dysfunction, No evidence of seizures.   Hold all sedating  medications. Continue seizure precautions, fall precautions, aspiration precautions . Clear liquid diet.  Patient is alert and has been tolerating diet. Difficult to determine what his baseline mental status is. Neurology consulted,   B12 and folate within normal limits Obtain MRI.  Monitor clinical status.   Seizure disorder Continue Keppra  IV. EEG  > Moderate diffuse slowing indicative of global cerebral dysfunction, No evidence of seizures Seizure precautions, follow-up neurology.   Elevated troponin: EKG shows sinus rhythm, short PR interval, Unable to determine whether he actually has any pain. Troponin trended down 110> 79, likely demand ischemia. Patient is not a candidate for anticoagulation given history of ICH   Orthostatic hypotension: Patient remains on midodrine  and Florinef . Blood pressure has been elevated.  Hold on both. He may require IV hydralazine  as needed.   CKD stage IIIa: Serum creatinine at baseline.   Avoid nephrotoxic medications   Hyperlipidemia: Resume Lipitor  when patient is more awake and alert.   History of multiple myeloma: Follow-up outpatient.   Mild cognitive impairment: At baseline patient is able to follow commands, Patient is able to convey needs.   Type 1 diabetes with hypoglycemia: Hold on insulin  as patient has hypoglycemic episodes Monitor CBG and start sliding scale. Continue D5W @ 40 cc/hr   DVT prophylaxis: SCDs Code Status: Full code Family Communication:No family at bed side. Disposition Plan:     Status is: Inpatient Remains inpatient appropriate because: Severity of illness / Workup in progress.   Consultants:  Neurology  Procedures: CT Head / MRI.  Antimicrobials:  Anti-infectives (From admission, onward)    None      Subjective: Patient was  seen and examined at bedside.  Overnight events noted. Patient is arousable, responds on calling his name and then go back to sleep. RN reported patient has eaten  most of his meal.  MRI is pending.  Objective: Vitals:   12/25/23 1700 12/25/23 1922 12/26/23 0040 12/26/23 0320  BP: (!) 170/103 (!) 140/67 (!) 146/92 136/88  Pulse: 90 88 93 90  Resp: 12 15 15 12   Temp: 98.3 F (36.8 C) 97.7 F (36.5 C) 98.7 F (37.1 C) 98.9 F (37.2 C)  TempSrc: Oral Oral Oral Oral  SpO2: 100% 99% 100% 100%    Intake/Output Summary (Last 24 hours) at 12/26/2023 0955 Last data filed at 12/26/2023 0900 Gross per 24 hour  Intake 45.46 ml  Output 300 ml  Net -254.54 ml   There were no vitals filed for this visit.  Examination:  General exam: Appears calm and comfortable, arousable, not in any acute distress. Respiratory system: CTA Bilaterally. Respiratory effort normal.  RR 16 Cardiovascular system: S1 & S2 heard, RRR. No JVD, murmurs, rubs, gallops or clicks.  Gastrointestinal system: Abdomen is non distended, soft and non tender.  Normal bowel sounds heard. Central nervous system: Alert and oriented x 1. No focal neurological deficits. Extremities: No edema, no cyanosis, no clubbing. Skin: No rashes, lesions or ulcers Psychiatry:  Mood & affect appropriate.    Data Reviewed: I have personally reviewed following labs and imaging studies  CBC: Recent Labs  Lab 12/24/23 1437 12/24/23 1447 12/25/23 0455 12/26/23 0229  WBC 3.9*  --  3.6* 3.5*  NEUTROABS 2.2  --   --   --   HGB 8.0* 8.8*  8.8* 8.2* 8.0*  HCT 25.1* 26.0*  26.0* 26.9* 23.9*  MCV 101.6*  --  101.5* 96.0  PLT 253  --  269 284   Basic Metabolic Panel: Recent Labs  Lab 12/24/23 1437 12/24/23 1447 12/25/23 0455 12/26/23 0229  NA 135 138  138 131* 133*  K 4.2 4.5  4.5 4.1 4.2  CL 104 104 101 102  CO2 22  --  22 21*  GLUCOSE 202* 214* 68* 194*  BUN 27* 32* 18 20  CREATININE 1.05 1.10 1.07 1.16  CALCIUM  8.2*  --  8.2* 8.0*  MG  --   --  1.6* 1.9  PHOS  --   --  3.0 3.5   GFR: CrCl cannot be calculated (Unknown ideal weight.). Liver Function Tests: Recent Labs  Lab  12/24/23 1437 12/25/23 0455  AST 31 24  ALT 39 31  ALKPHOS 97 96  BILITOT 0.5 0.4  PROT 8.0 7.5  ALBUMIN 2.6* 2.4*   No results for input(s): LIPASE, AMYLASE in the last 168 hours. Recent Labs  Lab 12/25/23 0455  AMMONIA  48*   Coagulation Profile: No results for input(s): INR, PROTIME in the last 168 hours. Cardiac Enzymes: No results for input(s): CKTOTAL, CKMB, CKMBINDEX, TROPONINI in the last 168 hours. BNP (last 3 results) No results for input(s): PROBNP in the last 8760 hours. HbA1C: No results for input(s): HGBA1C in the last 72 hours. CBG: Recent Labs  Lab 12/24/23 1406 12/25/23 1231 12/25/23 1350 12/25/23 1809  GLUCAP 198* 80 90 116*   Lipid Profile: No results for input(s): CHOL, HDL, LDLCALC, TRIG, CHOLHDL, LDLDIRECT in the last 72 hours. Thyroid  Function Tests: Recent Labs    12/25/23 0455  TSH 4.132   Anemia Panel: Recent Labs    12/25/23 0455  VITAMINB12 608  FOLATE >20.0   Sepsis Labs: Recent Labs  Lab 12/24/23 1450  LATICACIDVEN 1.4    No results found for this or any previous visit (from the past 240 hours).   Radiology Studies: EEG adult Result Date: 12/25/2023 Matthews Elida HERO, MD     12/25/2023  3:46 PM Routine EEG Report ARMANIE MARTINE is a 73 y.o. male with a history of altered mental status who is undergoing an EEG to evaluate for seizures. Report: This EEG was acquired with electrodes placed according to the International 10-20 electrode system (including Fp1, Fp2, F3, F4, C3, C4, P3, P4, O1, O2, T3, T4, T5, T6, A1, A2, Fz, Cz, Pz). The following electrodes were missing or displaced: none. The occipital dominant rhythm was 5-6 Hz. This activity is reactive to stimulation. Drowsiness was manifested by background fragmentation; deeper stages of sleep were identified by K complexes and sleep spindles. There was no focal slowing. There were no interictal epileptiform discharges. There were no electrographic  seizures identified. There was no abnormal response to photic stimulation or hyperventilation. Impression and clinical correlation: This EEG was obtained while awake and asleep and is abnormal due to moderate diffuse slowing indicative of global cerebral dysfunction. Epileptiform abnormalities were not seen during this recording. Elida Matthews, MD [facility] [phone] If 7pm- 7am, please page neurology on call as listed in AMION.   CT Cervical Spine Wo Contrast Result Date: 12/24/2023 EXAM: CT CERVICAL SPINE WITHOUT CONTRAST 12/24/2023 03:32:00 PM TECHNIQUE: CT of the cervical spine was performed without the administration of intravenous contrast. Multiplanar reformatted images are provided for review. Automated exposure control, iterative reconstruction, and/or weight based adjustment of the mA/kV was utilized to reduce the radiation dose to as low as reasonably achievable. COMPARISON: CT of the cervical spine 11/15/2023. CLINICAL HISTORY: Neck trauma (Age >= 65y). Chief complaints include altered mental status, hypoglycemic, and baseline dysphagia. The patient has a baseline headache since a head bleed in August from a fall and a left shoulder fracture from a previous fall. FINDINGS: CERVICAL SPINE: BONES AND ALIGNMENT: No acute fracture or traumatic malalignment. DEGENERATIVE CHANGES: Multilevel degenerative changes are again noted in the cervical spine. SOFT TISSUES: No prevertebral soft tissue swelling. Atherosclerotic calcifications are present at the carotid bifurcations bilaterally without significant change. Stenosis is suspected. IMPRESSION: 1. No acute abnormality of the cervical spine related to the reported neck trauma. 2. Multilevel degenerative changes in the cervical spine. Electronically signed by: Lonni Necessary MD 12/24/2023 04:08 PM EDT RP Workstation: [id number]   CT Maxillofacial Wo Contrast Result Date: 12/24/2023 EXAM: CT OF THE FACE WITHOUT CONTRAST 12/24/2023  03:32:00 PM TECHNIQUE: CT of the face was performed without the administration of intravenous contrast. Multiplanar reformatted images are provided for review. Automated exposure control, iterative reconstruction, and/or weight based adjustment of the mA/kV was utilized to reduce the radiation dose to as low as reasonably achievable. COMPARISON: CT head without contrast 12/08/2023. CLINICAL HISTORY: Facial trauma, blunt. Altered mental status, hypoglycemic, improved with glucagon . Baseline dysphagia, headache from a head bleed in August, and a left shoulder fracture from a previous fall. FINDINGS: FACIAL BONES: No acute facial fracture. No underlying fracture or foreign body is present in the region of the left supraorbital scalp hematoma. No mandibular dislocation. No suspicious bone lesion. ORBITS: Globes are intact. No acute traumatic injury. No inflammatory change. SINUSES AND MASTOIDS: Minimal mucosal thickening is present in the left maxillary sinus. SOFT TISSUES: Left supraorbital scalp hematoma is present. IMPRESSION: 1. Left supraorbital scalp hematoma without underlying fracture or foreign body. 2. Minimal mucosal thickening in  the left maxillary sinus. Electronically signed by: Lonni Necessary MD 12/24/2023 04:04 PM EDT RP Workstation: [id number]   CT Head Wo Contrast Result Date: 12/24/2023 EXAM: CT HEAD WITHOUT CONTRAST 12/24/2023 03:32:00 PM TECHNIQUE: CT of the head was performed without the administration of intravenous contrast. Automated exposure control, iterative reconstruction, and/or weight based adjustment of the mA/kV was utilized to reduce the radiation dose to as low as reasonably achievable. COMPARISON: CT head without contrast 12/08/2023. CLINICAL HISTORY: Mental status change, unknown cause. Chief complaints: Altered Mental Status. Last known well 1045. Presents with AMS, hypoglycemic. Got glucagon  x1 at facility mental status improved. Alert to self. Baseline dysphagia. EMS VS:  196/110 80bpm CBG 296 20RR. GCS 14. Baseline headache since head bleed in August from fall. L shoulder fx from previous fall. FINDINGS: BRAIN AND VENTRICLES: No acute hemorrhage. No evidence of acute infarct. No hydrocephalus. No extra-axial collection. No mass effect or midline shift. Dense foci of ossification are noted along the tentorium and intracerebral falx. Atrophy and white matter changes are stable. Atherosclerotic calcifications are present in the cavernous carotid arteries bilaterally. No hyperdense vessel is present. ORBITS: No acute abnormality. SINUSES: No acute abnormality. SOFT TISSUES AND SKULL: Left supraorbital scalp hematoma is present without underlying fracture. No skull fracture. IMPRESSION: 1. No acute intracranial abnormality. 2. Left supraorbital scalp hematoma without underlying fracture. Electronically signed by: Lonni Necessary MD 12/24/2023 04:01 PM EDT RP Workstation: [id number]   DG Chest Port 1 View Result Date: 12/24/2023 CLINICAL DATA:  Altered level of consciousness EXAM: PORTABLE CHEST 1 VIEW COMPARISON:  11/15/2023 FINDINGS: Single frontal view of the chest demonstrates mild enlargement of the cardiac silhouette. There is bibasilar consolidation, greatest in the left lower lobe. Small left pleural effusion is noted. No pneumothorax. There are no acute displaced fractures. Further displacement of the known proximal left humeral fracture since prior exam. Healing right lateral eighth rib fracture with moderate callus formation. No acute fractures. IMPRESSION: 1. Bibasilar consolidation, left greater than right, consistent with infection, aspiration, or asymmetric edema. 2. Small left pleural effusion. 3. Enlarged cardiac silhouette. 4. Further displacement and impaction of the known proximal left humeral fracture. Subacute healing right lateral eighth rib fracture. Electronically Signed   By: Ozell Daring M.D.   On: 12/24/2023 15:10   Scheduled Meds:  atorvastatin   80  mg Oral QHS   docusate sodium   100 mg Oral BID   levETIRAcetam   500 mg Intravenous Q12H   [START ON 01/02/2024] thiamine  (VITAMIN B1) injection  100 mg Intravenous Daily   Continuous Infusions:  thiamine  (VITAMIN B1) injection 500 mg (12/26/23 9371)   Followed by   NOREEN ON 12/27/2023] thiamine  (VITAMIN B1) injection       LOS: 2 days    Time spent: 35 mins    Darcel Dawley, MD [facility]   If 7PM-7AM, please contact night-coverage

## 2023-12-26 NOTE — Evaluation (Signed)
 Clinical/Bedside Swallow Evaluation Patient Details  Name: Nicholas Weiss MRN: [id number] Date of Birth: 1951/01/07  Today's Date: 12/26/2023 Time: SLP Start Time (ACUTE ONLY): 1422 SLP Stop Time (ACUTE ONLY): 1435 SLP Time Calculation (min) (ACUTE ONLY): 13 min  Past Medical History:  Past Medical History:  Diagnosis Date   Acute metabolic encephalopathy 12/02/2020   DKA (diabetic ketoacidoses) 04/06/2016   Hypercholesteremia    Hypertension    Past Surgical History:  Past Surgical History:  Procedure Laterality Date   AMPUTATION TOE Left 04/25/2022   Procedure: LEFT GREAT TOE AMPUTATION AND LEFT PARTIAL 2ND TOE AMPUTATION;  Surgeon: Janit Thresa HERO, DPM;  Location: [facility] ORS;  Service: Podiatry;  Laterality: Left;   COLONOSCOPY WITH PROPOFOL  N/A 02/01/2020   Procedure: COLONOSCOPY WITH PROPOFOL ;  Surgeon: Unk Corinn Skiff, MD;  Location: [facility] ENDOSCOPY;  Service: Gastroenterology;  Laterality: N/A;   ESOPHAGOGASTRODUODENOSCOPY  02/01/2020   Procedure: ESOPHAGOGASTRODUODENOSCOPY (EGD);  Surgeon: Unk Corinn Skiff, MD;  Location: [facility] ENDOSCOPY;  Service: Gastroenterology;;   ESOPHAGOGASTRODUODENOSCOPY (EGD) WITH PROPOFOL  N/A 06/07/2022   Procedure: ESOPHAGOGASTRODUODENOSCOPY (EGD) WITH PROPOFOL ;  Surgeon: Saintclair Jasper, MD;  Location: [facility] ENDOSCOPY;  Service: Gastroenterology;  Laterality: N/A;   KNEE SURGERY Right    Torn meniscus   KNEE SURGERY Left    HPI:  This 73 yrs old Male with PMH significant for type 1 diabetes, hyperlipidemia, both subdural and intraventricular hemorrhage, Seizure disorder was sent from skilled nursing facility for altered mental status.  Patient was found to be confused, barely responsive.  Last known well time 10:45 AM.  He was found to be hypoglycemic,  blood glucose was 40,  Patient was given glucagon , subsequently staff noticed he still appeared to be altered from his baseline.  At baseline he is slow to respond but following commands.  Nursing home  staff denies any fever, sore throat, cough, nausea, vomiting, or other sick contacts.CT head: No acute intracranial abnormality. Left supraorbital scalp hematoma without underlying fracture.  CT C-spine :  No acute abnormality of the cervical spine.    Assessment / Plan / Recommendation  Clinical Impression   Pt seen for skilled ST services to determine swallowing abilities. He was on a dys 3/thin diet and was assessed with thins, puree, and soft solids. OME limited by cognitive impairments/poor direction following, no observed asymmetry but generalized weakness observed. He was observed with the RN having lunch. which the SLP took over for feeding, he was masticating a single bite for over two minutes. Verbal cues to attempt a swallow unsuccessful despite the pt acknowledging ok. A large masticated bolus of solids was sitting on the right hand side of his oral cavity. Cued liquid washes also unsuccessful. The pt was then asked to expectorate the bolus, which he did so with minimal prompting. He consumed several sips of thin liquid with no overt or subtle s/s of aspiration. He also consumed puree followed by a liquid wash with increased success compared to his minced solids. RN educated on compensatory swallow and feeding strategies. Recommend puree with thins with TOTAL assist for all meals and STRICT aspiration precautions (small bites/sips, eat/drink slowly, sit upright and stay upright for at least 30 minutes following, check for pocketing with food, liquids following solids) and FREQUENT oral care. SLP to f/u closely to ensure success with diet recs and carryover of swallow and feeding strategies.   SLP Visit Diagnosis: Dysphagia, oral phase (R13.11)    Aspiration Risk  Mild aspiration risk    Diet Recommendation Dysphagia 1 (  Puree);Thin liquid    Liquid Administration via: Straw Medication Administration: Crushed with puree Supervision: Staff to assist with self feeding;Full supervision/cueing  for compensatory strategies Compensations: Minimize environmental distractions;Slow rate;Small sips/bites;Lingual sweep for clearance of pocketing;Monitor for anterior loss;Follow solids with liquid Postural Changes: Seated upright at 90 degrees;Remain upright for at least 30 minutes after po intake    Other  Recommendations Oral Care Recommendations: Oral care QID;Oral care before and after PO;Staff/trained caregiver to provide oral care     Assistance Recommended at Discharge    Functional Status Assessment Patient has had a recent decline in their functional status and demonstrates the ability to make significant improvements in function in a reasonable and predictable amount of time.  Frequency and Duration min 3x week  1 week       Prognosis Prognosis for improved oropharyngeal function: Fair Barriers to Reach Goals: Cognitive deficits;Severity of deficits      Swallow Study   General Date of Onset: 12/24/23 HPI: This 73 yrs old Male with PMH significant for type 1 diabetes, hyperlipidemia, both subdural and intraventricular hemorrhage, Seizure disorder was sent from skilled nursing facility for altered mental status.  Patient was found to be confused, barely responsive.  Last known well time 10:45 AM.  He was found to be hypoglycemic,  blood glucose was 40,  Patient was given glucagon , subsequently staff noticed he still appeared to be altered from his baseline.  At baseline he is slow to respond but following commands.  Nursing home staff denies any fever, sore throat, cough, nausea, vomiting, or other sick contacts.CT head: No acute intracranial abnormality. Left supraorbital scalp hematoma without underlying fracture.  CT C-spine :  No acute abnormality of the cervical spine. Type of Study: Bedside Swallow Evaluation Previous Swallow Assessment: BSE on 11/16/2023 (from previous eval :  Recommend a puree diet with thin liquids with standard aspiration precautions and meds as  tolerated) Diet Prior to this Study: Dysphagia 2 (finely chopped);Thin liquids (Level 0) Temperature Spikes Noted: No Respiratory Status: Room air History of Recent Intubation: No Behavior/Cognition: Pleasant mood;Requires cueing;Doesn't follow directions;Distractible;Confused Oral Cavity Assessment: Within Functional Limits Oral Care Completed by SLP: No Oral Cavity - Dentition: Other (Comment) (Difficult to know due to poor direction following, nursing reports he has no dentures present) Vision: Functional for self-feeding Self-Feeding Abilities: Able to feed self Patient Positioning: Upright in bed Baseline Vocal Quality: Other (comment) (Harsh vocal quality) Volitional Cough: Cognitively unable to elicit Volitional Swallow: Unable to elicit    Oral/Motor/Sensory Function Overall Oral Motor/Sensory Function: Mild impairment   Ice Chips     Thin Liquid Thin Liquid: Within functional limits    Nectar Thick     Honey Thick     Puree Puree: Impaired Presentation: Spoon Oral Phase Impairments: Poor awareness of bolus Oral Phase Functional Implications: Prolonged oral transit;Oral holding Pharyngeal Phase Impairments: Suspected delayed Swallow   Solid     Solid: Impaired Presentation: Self Fed Oral Phase Impairments: Poor awareness of bolus Oral Phase Functional Implications: Prolonged oral transit;Impaired mastication;Oral residue;Oral holding Pharyngeal Phase Impairments: Unable to trigger swallow     Manuelita Blew M.S. CCC-SLP

## 2023-12-26 NOTE — Plan of Care (Signed)

## 2023-12-26 NOTE — Progress Notes (Signed)
 NEUROLOGY CONSULT FOLLOW UP NOTE   Date of service: December 26, 2023 Patient Name: Nicholas Weiss MRN:  [id number] DOB:  January 31, 1951  Interval Hx/subjective   Patient sleepy, but awakens to voice. Poor attention. Knows he's in the hospital, keeps eyes closed for most of exam.   Vitals   Vitals:   12/25/23 1700 12/25/23 1922 12/26/23 0040 12/26/23 0320  BP: (!) 170/103 (!) 140/67 (!) 146/92 136/88  Pulse: 90 88 93 90  Resp: 12 15 15 12   Temp: 98.3 F (36.8 C) 97.7 F (36.5 C) 98.7 F (37.1 C) 98.9 F (37.2 C)  TempSrc: Oral Oral Oral Oral  SpO2: 100% 99% 100% 100%     There is no height or weight on file to calculate BMI.  Physical Exam   Constitutional: Appears well-developed and well-nourished.  Neurologic Examination    MS: sleepy but awakens to voice. Very poor attention and concentration. He knows he's in the hospital but then closes his eyes and refuses to answer additional questions, even when given options to choose from. He does follow simple commands x2. CN: He does not open eyes for VF exam. Facial movement is symmetric. Hearing intact.  Motor: Moves all extremities well Sensory: Intact to light touch  Medications  Current Facility-Administered Medications:    acetaminophen  (TYLENOL ) tablet 650 mg, 650 mg, Oral, Q6H PRN **OR** acetaminophen  (TYLENOL ) suppository 650 mg, 650 mg, Rectal, Q6H PRN, Leotis, Pardeep, MD   atorvastatin  (LIPITOR ) tablet 80 mg, 80 mg, Oral, QHS, Khatri, Pardeep, MD, 80 mg at 12/24/23 2112   dextrose  5 % solution, , Intravenous, Continuous, Leotis, Pardeep, MD, Last Rate: 40 mL/hr at 12/25/23 1443, Restarted at 12/25/23 1443   docusate sodium  (COLACE) capsule 100 mg, 100 mg, Oral, BID, Leotis, Pardeep, MD, 100 mg at 12/24/23 2112   hydrALAZINE  (APRESOLINE ) injection 10 mg, 10 mg, Intravenous, Q8H PRN, Leotis Bogus, MD, 10 mg at 12/24/23 2114   levETIRAcetam  (KEPPRA ) undiluted injection 500 mg, 500 mg, Intravenous, Q12H, Khatri,  Pardeep, MD, 500 mg at 12/26/23 0615   polyethylene glycol (MIRALAX  / GLYCOLAX ) packet 17 g, 17 g, Oral, Daily PRN, Leotis Bogus, MD   thiamine  (VITAMIN B1) 500 mg in sodium chloride  0.9 % 50 mL IVPB, 500 mg, Intravenous, Q8H, Last Rate: 110 mL/hr at 12/26/23 0628, 500 mg at 12/26/23 0628 **FOLLOWED BY** [START ON 12/27/2023] thiamine  (VITAMIN B1) 250 mg in sodium chloride  0.9 % 50 mL IVPB, 250 mg, Intravenous, Daily **FOLLOWED BY** [START ON 01/02/2024] thiamine  (VITAMIN B1) injection 100 mg, 100 mg, Intravenous, Daily, Khaliqdina, Salman, MD  Facility-Administered Medications Ordered in Other Encounters:    epoetin  alfa-epbx (RETACRIT ) injection 40,000 Units, 40,000 Units, Subcutaneous, Q21 days, Melanee Annah BROCKS, MD, 40,000 Units at 07/09/23 1153  Labs and Diagnostic Imaging   B12 608 Folate normal TSH 4.1 Ammonia  48  Imaging(Personally reviewed): CT head is negative  EEG is negative   Assessment   Nicholas Weiss is a 73 y.o. male with hx of SDH in June and intraventriular bleed in August who presented with encephalopathy in the setting of low blood glucose, my suspicion is this represents simply low blood glucose.    He was in the ER room for quite some time, and is at risk of developing delirium. Suspect that yesterday's lack of participation and confusion can be attributed to that. Today he seems to be slightly more interactive as he does answer 1-2 questions before closing his eyes and doesn't answer any further.   His blood sugars  have been up and down throughout his stay. CBG this am 263.Ammonia  was elevated yesterday. Magnesium  level is improved. UA has not been collected.  Patient was noted to not speak at previous outpatient neurology appointment on 12/07/23, but was interactive and oriented on inpatient neurology consult 11/18/2023 during hospital admission from 8/18-8/29/2025. I spoke to [place], CNA at [facility], who has worked with patient often. When I told her my  assessment of his interaction today, she stated that he does that a lot and that it sounds like how he usually is. Suspect that his altered mental status/decreased responsiveness was due to his hypoglycemia on admission.   He has been refusing MRI Brain. Do not think this is needed to pursue against patient's wishes.   Recommendations   Continue Thiamine  Repeat Ammonia  level today, as yesterday's level was elevated.  Collect ordered UA Metabolic and Infectious management per primary team, as you are.   Neurology will sign off. Please recall with any further questions or concerns.  Than you for this consult.  ______________________________________________________________________    Rocky JAYSON Likes, DNP, AGACNP-BC [facility] Please use AMION for contact information & EPIC for messaging.

## 2023-12-27 DIAGNOSIS — G9341 Metabolic encephalopathy: Secondary | ICD-10-CM | POA: Diagnosis not present

## 2023-12-27 LAB — BASIC METABOLIC PANEL WITH GFR
Anion gap: 10 (ref 5–15)
BUN: 31 mg/dL — ABNORMAL HIGH (ref 8–23)
CO2: 20 mmol/L — ABNORMAL LOW (ref 22–32)
Calcium: 8 mg/dL — ABNORMAL LOW (ref 8.9–10.3)
Chloride: 101 mmol/L (ref 98–111)
Creatinine, Ser: 1.52 mg/dL — ABNORMAL HIGH (ref 0.61–1.24)
GFR, Estimated: 49 mL/min — ABNORMAL LOW (ref >60)
Glucose, Bld: 357 mg/dL — ABNORMAL HIGH (ref 70–99)
Potassium: 4 mmol/L (ref 3.5–5.1)
Sodium: 131 mmol/L — ABNORMAL LOW (ref 135–145)

## 2023-12-27 LAB — CBC
HCT: 23.5 % — ABNORMAL LOW (ref 39.0–52.0)
Hemoglobin: 7.6 g/dL — ABNORMAL LOW (ref 13.0–17.0)
MCH: 31 pg (ref 26.0–34.0)
MCHC: 32.3 g/dL (ref 30.0–36.0)
MCV: 95.9 fL (ref 80.0–100.0)
Platelets: 183 K/uL (ref 150–400)
RBC: 2.45 MIL/uL — ABNORMAL LOW (ref 4.22–5.81)
RDW: 15.1 % (ref 11.5–15.5)
WBC: 2.9 K/uL — ABNORMAL LOW (ref 4.0–10.5)
nRBC: 0 % (ref 0.0–0.2)

## 2023-12-27 LAB — GLUCOSE, CAPILLARY
Glucose-Capillary: 119 mg/dL — ABNORMAL HIGH (ref 70–99)
Glucose-Capillary: 177 mg/dL — ABNORMAL HIGH (ref 70–99)
Glucose-Capillary: 192 mg/dL — ABNORMAL HIGH (ref 70–99)
Glucose-Capillary: 212 mg/dL — ABNORMAL HIGH (ref 70–99)
Glucose-Capillary: 425 mg/dL — ABNORMAL HIGH (ref 70–99)
Glucose-Capillary: 58 mg/dL — ABNORMAL LOW (ref 70–99)
Glucose-Capillary: 78 mg/dL (ref 70–99)

## 2023-12-27 MED ORDER — ENOXAPARIN SODIUM 40 MG/0.4ML IJ SOSY
40.0000 mg | PREFILLED_SYRINGE | INTRAMUSCULAR | Status: DC
  Administered 2023-12-27 – 2023-12-29 (×3): 40 mg via SUBCUTANEOUS
  Filled 2023-12-27 (×3): qty 0.4

## 2023-12-27 MED ORDER — SODIUM CHLORIDE 0.9 % IV SOLN: INTRAVENOUS | Status: AC

## 2023-12-27 NOTE — Progress Notes (Signed)
 Hypoglycemic Event  CBG: 58   Treatment: 4 oz juice/soda  Symptoms: None  Follow-up CBG: Upfz:[id number]  CBG Result:78   Possible Reasons for Event: Inadequate meal intake  Comments/MD notified:Yes    [name]

## 2023-12-27 NOTE — Inpatient Diabetes Management (Signed)
 Inpatient Diabetes Program Recommendations  AACE/ADA: New Consensus Statement on Inpatient Glycemic Control (2015)  Target Ranges:  Prepandial:   less than 140 mg/dL      Peak postprandial:   less than 180 mg/dL (1-2 hours)      Critically ill patients:  140 - 180 mg/dL   Lab Results  Component Value Date   GLUCAP 119 (H) 12/27/2023   HGBA1C 10.0 (H) 07/21/2023    Review of Glycemic Control  Diabetes history: DM1 Outpatient Diabetes medications: Insulin  Pump Dexcom G7 CGM Humalog in insulin  pump                              (T-slim); Basal rate 12am 0.25 units/hr (24hr = 6 units) ISF 60 mg/dl I:C ratio 20 grams Off pump regimen:  Take Tresiba  6 units at bedtime Lispro/Humalog/NovoLog : before breakfast -- take 1 unit, before lunch -- take 2 units, before supper -- take 4 units + if sugar is over 150 before the meal, you can ADD 1 unit.   At SNF: Lantus  15 units QAM, Humalog 0-12 QID  Current orders for Inpatient glycemic control: Novolog  0-6 units Q4H  Inpatient Diabetes Program Recommendations:   Consider adding Lantus  5-6 units at bedtime (Type 1 DM and needs small amount of basal insulin )  Will follow glucose trends daily.  Thank you. Shona Brandy, RD, LDN, CDCES Inpatient Diabetes Coordinator [phone]

## 2023-12-27 NOTE — TOC Initial Note (Addendum)
 Transition of Care [facility]) - Initial/Assessment Note    Patient Details  Name: Nicholas Weiss MRN: [id number] Date of Birth: [birth year]  [facility]) CM/SW Contact:    Luise JAYSON Pan, LCSWA Phone Number: 12/27/2023, 1:03 PM  Clinical Narrative:  Patient recently at [facility] SNF for rehab from 8/29 - 9/26.  CSW spoke with patients Friend Gennie), patient is currently ox1, to confirm patient was at [facility] for rehab. [name] stated yes and is okay with patient going back to [facility] if bed is available. If no bed is available, [name] stated she would like to look in [facility] for SNF rehab bed).   [name] informed CSW that patient should have ltc medicaid.  3:31 PM Per [facility], they are able to accept patient back. CSW to start auth.   3:41 PM CSW started insurance auth. Auth pending - id [id number]  CSW will continue to follow.    Expected Discharge Plan: Skilled Nursing Facility Barriers to Discharge: Continued Medical Work up, SNF bed availability, Insurance Auth    Patient Goals and CMS Choice Patient states their goals for this hospitalization and ongoing recovery are:: Go back to rehab          Expected Discharge Plan and Services In-house Referral: Clinical Social Work     Living arrangements for the past 2 months: Skilled Holiday representative (Originally from home but went ot SNF rehab on 8/29)                                      Prior Living Arrangements/Services Living arrangements for the past 2 months: Skilled Nursing Facility (Originally from home but went ot SNF rehab on 8/29) Lives with:: Facility Resident Patient language and need for interpreter reviewed:: Yes Do you feel safe going back to the place where you live?: Yes      Need for Family Participation in Patient Care: Yes (Comment) Care giver support system in place?: Yes (comment) Current home services: DME (RW,CANE, WHEELCHAIR) Criminal Activity/Legal Involvement Pertinent to Current  Situation/Hospitalization: No - Comment as needed  Activities of Daily Living      Permission Sought/Granted Permission sought to share information with : Facility Medical sales representative, Other (comment) (Friend support) Permission granted to share information with : Yes, Verbal Permission Granted  Share Information with NAME: Bruce,[name]  Permission granted to share info w AGENCY: SNFs  Permission granted to share info w Relationship: friend  Permission granted to share info w Contact Information: [phone]  Emotional Assessment Appearance:: Appears stated age Attitude/Demeanor/Rapport: Engaged Affect (typically observed): Pleasant Orientation: : Oriented to Self Alcohol / Substance Use: Not Applicable Psych Involvement: No (comment)  Admission diagnosis:  Elevated troponin [R79.89] Altered mental status, unspecified altered mental status type [R41.82] Acute metabolic encephalopathy [G93.41] Patient Active Problem List   Diagnosis Date Noted   Protein-calorie malnutrition, moderate 11/22/2023   Left supracondylar humerus fracture, sequela 11/16/2023   Atrial fibrillation, chronic (HCC) 11/16/2023   Drop in hemoglobin 09/16/2023   Left arm pain 09/15/2023   Fall 09/15/2023   Polysubstance abuse (HCC) 09/15/2023   Myocardial injury 09/15/2023   Subdural bleeding (HCC) 08/27/2023   SDH (subdural hematoma) (HCC) 08/27/2023   MCI (mild cognitive impairment) with memory loss 08/18/2023   DKA (diabetic ketoacidosis) (HCC) 08/15/2023   High anion gap metabolic acidosis 08/15/2023   Prolonged QT interval 07/22/2023   Uncontrolled type 1 diabetes mellitus with hypoglycemia, with  long-term current use of insulin  (HCC) 04/05/2023   Hyperosmolar hyperglycemic state (HHS) (HCC) 03/26/2023   Iron  deficiency anemia 03/26/2023   Seizure disorder (HCC) 03/26/2023   Systemic inflammatory response syndrome (SIRS) associated with organ dysfunction (HCC) 03/26/2023   NSTEMI (non-ST  elevated myocardial infarction) (HCC) 03/09/2023   Elevated troponin 03/08/2023   Paroxysmal atrial fibrillation (HCC) 03/08/2023   Chronic anticoagulation 03/08/2023   ICH (intracerebral hemorrhage) (HCC) 02/16/2023   Uncontrolled diabetes mellitus with hyperglycemia (HCC) 02/16/2023   GERD without esophagitis 02/16/2023   Diabetic neuropathy (HCC) 02/16/2023   Intraventricular hemorrhage (HCC) 02/16/2023   Myoclonus 01/15/2023   Hypothermia 12/29/2022   Dizziness 11/13/2022   Substance abuse (HCC) 09/18/2022   History of GI bleed 09/18/2022   History of intraventricular hemorrhage following syncopal event 02/15/2023 09/18/2022   Uncontrolled type 1 diabetes mellitus with hyperglycemia, with long-term current use of insulin  (HCC) 08/26/2022   Postural dizziness with presyncope 08/18/2022   Near syncope 08/18/2022   Closed fracture of shaft of left humerus 06/18/2022   Malnutrition of moderate degree 06/10/2022   Orthostatic hypotension 06/10/2022   GI bleed 06/06/2022   Subdural hematoma (HCC) 05/15/2022   Falls 05/15/2022   Shoulder pain, left 05/15/2022   History of amputation of left great toe 05/04/2022   Hypophosphatemia 04/28/2022   Protein-calorie malnutrition, severe 04/24/2022   Diabetic ulcer of toe associated with diabetes mellitus due to underlying condition, with fat layer exposed (HCC) 04/24/2022   Streptococcal bacteremia 04/24/2022   Leukopenia 04/24/2022   History of multiple myeloma 04/24/2022   Dysautonomia orthostatic hypotension with recurrent syncope 04/24/2022   Weight loss 04/23/2022   Osteomyelitis of left foot (HCC) 04/22/2022   DKA, type 1 (HCC) 04/22/2022   Dehydration 04/08/2022   Diabetic ketoacidosis without coma associated with type 1 diabetes mellitus (HCC) 04/07/2022   Hyperkalemia 04/07/2022   Traumatic pneumothorax 02/18/2022   Multiple fractures of ribs, left side, initial encounter for closed fracture 02/18/2022   Pneumothorax, closed,  traumatic 02/18/2022   Benign prostatic hyperplasia with nocturia 02/12/2022   Stroke (cerebrum) (HCC) 11/07/2021   Multiple myeloma (HCC) 09/29/2021   Multiple myeloma not having achieved remission (HCC) 09/29/2021   Hyponatremia 08/18/2021   COVID-19 virus infection 08/13/2021   History of CVA (cerebrovascular accident) 08/13/2021   Syncope and collapse, recurrent 08/09/2021   Hepatic lesion 08/05/2021   Dyslipidemia 05/08/2021   History of substance abuse (HCC) 12/09/2020   Acute metabolic encephalopathy 12/02/2020   Cocaine abuse (HCC) 11/30/2020   Muscle twitching 09/12/2020   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   Pre-syncope 08/01/2020   Heart block AV first degree 08/01/2020   Anemia of chronic disease 05/28/2020   PUD (peptic ulcer disease)    Esophageal dysphagia    Encounter for screening colonoscopy    Hyperglycemia 07/28/2019   Generalized weakness 07/28/2019   Hypoglycemia 04/27/2019   Unresponsiveness 04/27/2019   Lung nodule 04/07/2019   Acute renal failure superimposed on stage 3a chronic kidney disease (HCC)    Hypertensive urgency 04/05/2019   AKI (acute kidney injury) 04/05/2019   CKD (chronic kidney disease) stage 3, GFR 30-59 ml/min (HCC) 04/05/2019   Hyperlipidemia 01/24/2016   Type 1 diabetes mellitus with diabetic neuropathy, unspecified (HCC) 05/31/2006   PCP:  Alla Amis, MD Pharmacy:   [facility] [address] ([address]), [place] - [address] [place]) [place] [address] Phone: [phone] Fax: [phone]  [facility] - [place], [place] - [phone] [place] [address] Phone: [phone] Fax: [phone]     Social Drivers of Health (SDOH) Social History: SDOH Screenings   Food Insecurity: Patient Unable To Answer (12/26/2023)  Housing: Unknown (11/16/2023)  Transportation Needs: No Transportation Needs (11/16/2023)  Utilities: Not At Risk  (11/16/2023)  Alcohol Screen: Low Risk  (10/02/2020)  Depression (PHQ2-9): Low Risk  (12/08/2023)  Financial Resource Strain: Low Risk  (10/20/2023)   Received from [facility]  Physical Activity: Insufficiently Active (01/01/2020)  Social Connections: Moderately Isolated (11/16/2023)  Stress: No Stress Concern Present (01/01/2020)  Tobacco Use: Low Risk  (12/24/2023)  Recent Concern: Tobacco Use - Medium Risk (12/22/2023)   Received from [facility]   SDOH Interventions:     Readmission Risk Interventions    02/16/2023    3:56 PM 08/24/2022   10:57 AM 05/20/2022   11:41 AM  Readmission Risk Prevention Plan  Transportation Screening Complete Complete Complete  Medication Review Oceanographer) Complete Complete Complete  PCP or Specialist appointment within 3-5 days of discharge Complete Complete Complete  HRI or Home Care Consult Complete Complete Complete  SW Recovery Care/Counseling Consult Not Complete    Palliative Care Screening Not Applicable  Complete  Skilled Nursing Facility Complete Complete Complete

## 2023-12-27 NOTE — Plan of Care (Signed)

## 2023-12-27 NOTE — NC FL2 (Signed)
 [place]  MEDICAID FL2 LEVEL OF CARE FORM     IDENTIFICATION  Patient Name: Nicholas Weiss Birthdate: 1950-12-10 Sex: male Admission Date (Current Location): 12/24/2023  [place] and [place] Number:  [place]oyd [id number] L Facility and Address:  [facility] [place], [address], [place], [place] [address]      Provider Number: [id number]  Attending Physician Name and Address:  Leotis Bogus, MD  Relative Name and Phone Number:  Wolm Mems Pricilla)  [phone]    Current Level of Care: Hospital Recommended Level of Care: Skilled Nursing Facility Prior Approval Number:    Date Approved/Denied:   PASRR Number: [id number]  Discharge Plan: SNF    Current Diagnoses: Patient Active Problem List   Diagnosis Date Noted   Protein-calorie malnutrition, moderate 11/22/2023   Left supracondylar humerus fracture, sequela 11/16/2023   Atrial fibrillation, chronic (HCC) 11/16/2023   Drop in hemoglobin 09/16/2023   Left arm pain 09/15/2023   Fall 09/15/2023   Polysubstance abuse (HCC) 09/15/2023   Myocardial injury 09/15/2023   Subdural bleeding (HCC) 08/27/2023   SDH (subdural hematoma) (HCC) 08/27/2023   MCI (mild cognitive impairment) with memory loss 08/18/2023   DKA (diabetic ketoacidosis) (HCC) 08/15/2023   High anion gap metabolic acidosis 08/15/2023   Prolonged QT interval 07/22/2023   Uncontrolled type 1 diabetes mellitus with hypoglycemia, with long-term current use of insulin  (HCC) 04/05/2023   Hyperosmolar hyperglycemic state (HHS) (HCC) 03/26/2023   Iron  deficiency anemia 03/26/2023   Seizure disorder (HCC) 03/26/2023   Systemic inflammatory response syndrome (SIRS) associated with organ dysfunction (HCC) 03/26/2023   NSTEMI (non-ST elevated myocardial infarction) (HCC) 03/09/2023   Elevated troponin 03/08/2023   Paroxysmal atrial fibrillation (HCC) 03/08/2023   Chronic anticoagulation 03/08/2023   ICH (intracerebral hemorrhage) (HCC)  02/16/2023   Uncontrolled diabetes mellitus with hyperglycemia (HCC) 02/16/2023   GERD without esophagitis 02/16/2023   Diabetic neuropathy (HCC) 02/16/2023   Intraventricular hemorrhage (HCC) 02/16/2023   Myoclonus 01/15/2023   Hypothermia 12/29/2022   Dizziness 11/13/2022   Substance abuse (HCC) 09/18/2022   History of GI bleed 09/18/2022   History of intraventricular hemorrhage following syncopal event 02/15/2023 09/18/2022   Uncontrolled type 1 diabetes mellitus with hyperglycemia, with long-term current use of insulin  (HCC) 08/26/2022   Postural dizziness with presyncope 08/18/2022   Near syncope 08/18/2022   Closed fracture of shaft of left humerus 06/18/2022   Malnutrition of moderate degree 06/10/2022   Orthostatic hypotension 06/10/2022   GI bleed 06/06/2022   Subdural hematoma (HCC) 05/15/2022   Falls 05/15/2022   Shoulder pain, left 05/15/2022   History of amputation of left great toe 05/04/2022   Hypophosphatemia 04/28/2022   Protein-calorie malnutrition, severe 04/24/2022   Diabetic ulcer of toe associated with diabetes mellitus due to underlying condition, with fat layer exposed (HCC) 04/24/2022   Streptococcal bacteremia 04/24/2022   Leukopenia 04/24/2022   History of multiple myeloma 04/24/2022   Dysautonomia orthostatic hypotension with recurrent syncope 04/24/2022   Weight loss 04/23/2022   Osteomyelitis of left foot (HCC) 04/22/2022   DKA, type 1 (HCC) 04/22/2022   Dehydration 04/08/2022   Diabetic ketoacidosis without coma associated with type 1 diabetes mellitus (HCC) 04/07/2022   Hyperkalemia 04/07/2022   Traumatic pneumothorax 02/18/2022   Multiple fractures of ribs, left side, initial encounter for closed fracture 02/18/2022   Pneumothorax, closed, traumatic 02/18/2022   Benign prostatic hyperplasia with nocturia 02/12/2022   Stroke (cerebrum) (HCC) 11/07/2021   Multiple myeloma (HCC) 09/29/2021   Multiple myeloma not having achieved remission (HCC)  09/29/2021   Hyponatremia 08/18/2021   COVID-19 virus infection 08/13/2021   History of CVA (cerebrovascular accident) 08/13/2021   Syncope and collapse, recurrent 08/09/2021   Hepatic lesion 08/05/2021   Dyslipidemia 05/08/2021   History of substance abuse (HCC) 12/09/2020   Acute metabolic encephalopathy 12/02/2020   Cocaine abuse (HCC) 11/30/2020   Muscle twitching 09/12/2020   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   Pre-syncope 08/01/2020   Heart block AV first degree 08/01/2020   Anemia of chronic disease 05/28/2020   PUD (peptic ulcer disease)    Esophageal dysphagia    Encounter for screening colonoscopy    Hyperglycemia 07/28/2019   Generalized weakness 07/28/2019   Hypoglycemia 04/27/2019   Unresponsiveness 04/27/2019   Lung nodule 04/07/2019   Acute renal failure superimposed on stage 3a chronic kidney disease (HCC)    Hypertensive urgency 04/05/2019   AKI (acute kidney injury) 04/05/2019   CKD (chronic kidney disease) stage 3, GFR 30-59 ml/min (HCC) 04/05/2019   Hyperlipidemia 01/24/2016   Type 1 diabetes mellitus with diabetic neuropathy, unspecified (HCC) 05/31/2006    Orientation RESPIRATION BLADDER Height & Weight     Self  Normal Incontinent, External catheter Weight:   Height:     BEHAVIORAL SYMPTOMS/MOOD NEUROLOGICAL BOWEL NUTRITION STATUS      Continent Diet (Please see discharge summary)  AMBULATORY STATUS COMMUNICATION OF NEEDS Skin   Extensive Assist Verbally Normal                       Personal Care Assistance Level of Assistance  Bathing, Feeding, Dressing Bathing Assistance: Maximum assistance Feeding assistance: Limited assistance Dressing Assistance: Maximum assistance     Functional Limitations Info  Hearing, Sight, Speech Sight Info: Adequate Hearing Info: Adequate Speech Info: Adequate    SPECIAL CARE FACTORS FREQUENCY  PT (By licensed PT), OT (By licensed OT)     PT Frequency: 5x week OT Frequency: 5x week             Contractures Contractures Info: Not present    Additional Factors Info  Code Status, Allergies, Insulin  Sliding Scale Code Status Info: Full Allergies Info: Penicillins   Insulin  Sliding Scale Info: Please see discharge summary       Current Medications (12/27/2023):  This is the current hospital active medication list Current Facility-Administered Medications  Medication Dose Route Frequency Provider Last Rate Last Admin   0.9 %  sodium chloride  infusion   Intravenous Continuous Leotis Bogus, MD 40 mL/hr at 12/27/23 9374 Infusion Verify at 12/27/23 9374   acetaminophen  (TYLENOL ) tablet 650 mg  650 mg Oral Q6H PRN Leotis Bogus, MD       Or   acetaminophen  (TYLENOL ) suppository 650 mg  650 mg Rectal Q6H PRN Leotis Bogus, MD       atorvastatin  (LIPITOR ) tablet 80 mg  80 mg Oral QHS Leotis Bogus, MD   80 mg at 12/26/23 2153   docusate sodium  (COLACE) capsule 100 mg  100 mg Oral BID Leotis Bogus, MD   100 mg at 12/26/23 2153   enoxaparin  (LOVENOX ) injection 40 mg  40 mg Subcutaneous Q24H Leotis Bogus, MD   40 mg at 12/27/23 1238   hydrALAZINE  (APRESOLINE ) injection 10 mg  10 mg Intravenous Q8H PRN Leotis Bogus, MD   10 mg at 12/24/23 2114   insulin  aspart (novoLOG ) injection 0-6 Units  0-6 Units Subcutaneous Q4H Franky Redia SAILOR, MD   1 Units at 12/27/23 0442   levETIRAcetam  (KEPPRA ) undiluted injection 500 mg  500  mg Intravenous Q12H Leotis Bogus, MD   500 mg at 12/27/23 0530   polyethylene glycol (MIRALAX  / GLYCOLAX ) packet 17 g  17 g Oral Daily PRN Leotis Bogus, MD       thiamine  (VITAMIN B1) 250 mg in sodium chloride  0.9 % 50 mL IVPB  250 mg Intravenous Daily Khaliqdina, Salman, MD 105 mL/hr at 12/27/23 0939 250 mg at 12/27/23 9060   Followed by   NOREEN ON 01/02/2024] thiamine  (VITAMIN B1) injection 100 mg  100 mg Intravenous Daily Khaliqdina, Salman, MD       Facility-Administered Medications Ordered in Other Encounters  Medication Dose Route  Frequency Provider Last Rate Last Admin   epoetin  alfa-epbx (RETACRIT ) injection 40,000 Units  40,000 Units Subcutaneous Q21 days Rao, Archana C, MD   40,000 Units at 07/09/23 1153     Discharge Medications: Please see discharge summary for a list of discharge medications.  Relevant Imaging Results:  Relevant Lab Results:   Additional Information SSN [id number]  Luise JAYSON Pan, LCSWA

## 2023-12-27 NOTE — Progress Notes (Signed)
 Speech Language Pathology Treatment: Dysphagia  Patient Details Name: Nicholas Weiss MRN: [id number] DOB: 1950-12-09 Today's Date: 12/27/2023 Time: 0950-1000 SLP Time Calculation (min) (ACUTE ONLY): 10 min  Assessment / Plan / Recommendation Clinical Impression  Pt remains disoriented, seemingly in line with his baseline but is able to follow simple commands. He was attentive to mastication of Dys 3 solids today and cleared his oral cavity completely. No signs clinically concerning for aspiration were observed. SLP evaluation previous date recommended pureed solids but it appears the order was not modified to reflect this. RN denies difficulty with Dys 3 solids. Recommend he continue current diet without ongoing SLP f/u, will sign off acutely.    HPI HPI: 73 yo male with PMH including T1DM, HLD, prior SDH and IVH, seizure disorder, MCI with memory loss who presents to ED 9/26 with AMS. Admitted with acute encephalopathy secondary to hypoglycemia. CXR shows bibasilar consolidation, L>R, consistent with infection, aspiration, or asymmetric edema. CTH negative for acute intracranial abnormality. Pt seen by SLP in August with primarily oral deficits and before discharging on Dys 2 solids.      SLP Plan  All goals met          Recommendations  Diet recommendations: Dysphagia 3 (mechanical soft);Thin liquid Liquids provided via: Cup;Straw Medication Administration: Whole meds with puree Supervision: Staff to assist with self feeding;Full supervision/cueing for compensatory strategies Compensations: Minimize environmental distractions;Slow rate;Small sips/bites;Lingual sweep for clearance of pocketing;Monitor for anterior loss;Follow solids with liquid Postural Changes and/or Swallow Maneuvers: Seated upright 90 degrees                  Oral care BID   PRN Dysphagia, unspecified (R13.10)     All goals met     Damien Blumenthal, M.A., CCC-SLP Speech Language Pathology, Acute  Rehabilitation Services  Secure Chat preferred [phone]   12/27/2023, 10:48 AM

## 2023-12-27 NOTE — Evaluation (Signed)
 Physical Therapy Evaluation Patient Details Name: Nicholas Weiss MRN: [id number] DOB: 15-Apr-1950 Today's Date: 12/27/2023  History of Present Illness  73 y/o M presenting to ED on 9/26 from facility with AMS, found to be hypoglycemic. CT head with L supraorbital scalp heamtoma without underlying fx. PMH includes DM1, HLD, SDH, IVH, seziure disorder, mulitple myeloma, MCI, dyslipidemia, orthostatic hypotension, CKD III, L shoulder fx 10/2023  Clinical Impression  Pt admitted with above diagnosis. Pt seen by PT and OT for pt safety. Pt orthostatic with positional changes and this has been a continual issue, would benefit from compression stockings and abdominal binder as these did not come with him from SNF. Pt needed mod to max A +2 for all aspects of mobility. Was able to stand EOB but was unable to step feet and quickly returned to sitting due to decr BP. Patient will benefit from continued inpatient follow up therapy, <3 hours/day.  Pt currently with functional limitations due to the deficits listed below (see PT Problem List). Pt will benefit from acute skilled PT to increase their independence and safety with mobility to allow discharge.           If plan is discharge home, recommend the following: Two people to help with walking and/or transfers;A lot of help with bathing/dressing/bathroom;Assistance with feeding;Direct supervision/assist for medications management;Direct supervision/assist for financial management;Assist for transportation;Help with stairs or ramp for entrance;Supervision due to cognitive status   Can travel by private vehicle   No    Equipment Recommendations Wheelchair (measurements PT);Wheelchair cushion (measurements PT);Hoyer lift;Hospital bed  Recommendations for Other Services       Functional Status Assessment Patient has had a recent decline in their functional status and demonstrates the ability to make significant improvements in function in a reasonable and  predictable amount of time.     Precautions / Restrictions Precautions Precautions: Fall Restrictions Weight Bearing Restrictions Per Provider Order: Yes LUE Weight Bearing Per Provider Order: Non weight bearing (assuming NWB since last imaging with fx and has not been seen by orthopedics for f/u) Other Position/Activity Restrictions: sling per notes, sling not present in room      Mobility  Bed Mobility Overal bed mobility: Needs Assistance Bed Mobility: Supine to Sit, Sit to Supine     Supine to sit: Mod assist, +2 for physical assistance Sit to supine: Max assist, +2 for physical assistance   General bed mobility comments: helicopter method to avoid L shoulder WB. Pt with minimal participation with this though did grasp L rail with R hand with hand over hand guidance    Transfers Overall transfer level: Needs assistance Equipment used: Rolling walker (2 wheels) (RUE on RW only) Transfers: Sit to/from Stand Sit to Stand: Max assist, +2 physical assistance           General transfer comment: heavy post and R lateral lean    Ambulation/Gait               General Gait Details: pt unable to step feet fwd or lateral in standing  Stairs            Wheelchair Mobility     Tilt Bed    Modified Rankin (Stroke Patients Only)       Balance Overall balance assessment: History of Falls, Needs assistance Sitting-balance support: Single extremity supported, Feet supported Sitting balance-Leahy Scale: Fair     Standing balance support: During functional activity, Single extremity supported Standing balance-Leahy Scale: Zero Standing balance comment: allowed pt to  place R hand on RW. Max A needed to maintain standing due to R lean                             Pertinent Vitals/Pain Pain Assessment Pain Assessment: Faces Faces Pain Scale: No hurt Pain Intervention(s): Monitored during session    Home Living Family/patient expects to be  discharged to:: Skilled nursing facility                   Additional Comments: went home in august after hospitalization, went to [facility] and has been at [facility] since that time. Wife reports that his days just ran out though    Prior Function Prior Level of Function : Needs assist             Mobility Comments: assist for mobility, family unsure if he was able to walk with therapy at [facility] ADLs Comments: assist wtih ADLs     Extremity/Trunk Assessment   Upper Extremity Assessment Upper Extremity Assessment: Defer to OT evaluation LUE Deficits / Details: hx of L proximal humerus fx from eariler this year, 2+/5 elbow and limtied wrist extension (does not follow command) , grasp 3/5, shoulder not assessed. mild edema LUE Sensation: decreased light touch;decreased proprioception LUE Coordination: decreased fine motor;decreased gross motor    Lower Extremity Assessment Lower Extremity Assessment: Generalized weakness    Cervical / Trunk Assessment Cervical / Trunk Assessment: Kyphotic  Communication   Communication Communication: Impaired Factors Affecting Communication: Hearing impaired    Cognition Arousal: Alert Behavior During Therapy: Flat affect   PT - Cognitive impairments: History of cognitive impairments, Orientation, Awareness, Memory, Attention, Initiation, Sequencing, Problem solving, Safety/Judgement   Orientation impairments: Place, Time, Situation                   PT - Cognition Comments: pt has had significant cognitive decline since last fall in June but wife reports that he can usually state who she and her son are, which he could not do today. Decreased attention to LUE, moves RUE when cued to move L (though it has been in a sling). Following commands: Impaired Following commands impaired: Follows one step commands inconsistently     Cueing Cueing Techniques: Verbal cues     General Comments General comments (skin  integrity, edema, etc.): SBP with 15 pt drop from sitting to standing, pt relayed room spinned    Exercises     Assessment/Plan    PT Assessment Patient needs continued PT services  PT Problem List Decreased strength;Decreased activity tolerance;Decreased balance;Decreased mobility;Decreased coordination;Decreased cognition;Decreased knowledge of use of DME;Decreased safety awareness;Decreased knowledge of precautions;Pain       PT Treatment Interventions DME instruction;Gait training;Functional mobility training;Therapeutic activities;Therapeutic exercise;Balance training;Neuromuscular re-education;Cognitive remediation;Patient/family education    PT Goals (Current goals can be found in the Care Plan section)  Acute Rehab PT Goals Patient Stated Goal: return to rehab PT Goal Formulation: With patient/family Time For Goal Achievement: 01/10/24 Potential to Achieve Goals: Fair    Frequency Min 2X/week     Co-evaluation PT/OT/SLP Co-Evaluation/Treatment: Yes Reason for Co-Treatment: Complexity of the patient's impairments (multi-system involvement);Necessary to address cognition/behavior during functional activity;To address functional/ADL transfers;For patient/therapist safety PT goals addressed during session: Mobility/safety with mobility;Balance;Proper use of DME OT goals addressed during session: ADL's and self-care;Strengthening/ROM       AM-PAC PT 6 Clicks Mobility  Outcome Measure Help needed turning from your back to your side while  in a flat bed without using bedrails?: A Lot Help needed moving from lying on your back to sitting on the side of a flat bed without using bedrails?: A Lot Help needed moving to and from a bed to a chair (including a wheelchair)?: Total Help needed standing up from a chair using your arms (e.g., wheelchair or bedside chair)?: Total Help needed to walk in hospital room?: Total Help needed climbing 3-5 steps with a railing? : Total 6 Click  Score: 8    End of Session Equipment Utilized During Treatment: Gait belt Activity Tolerance: Patient limited by fatigue;Treatment limited secondary to medical complications (Comment) (hypotension) Patient left: in bed;with call bell/phone within reach;with bed alarm set;with family/visitor present Nurse Communication: Mobility status PT Visit Diagnosis: Muscle weakness (generalized) (M62.81);Difficulty in walking, not elsewhere classified (R26.2);Repeated falls (R29.6);Dizziness and giddiness (R42)    Time: 8844-8773 PT Time Calculation (min) (ACUTE ONLY): 31 min   Charges:   PT Evaluation $PT Eval Moderate Complexity: 1 Mod   PT General Charges $$ ACUTE PT VISIT: 1 Visit         Richerd Lipoma, PT  Acute Rehab Services Secure chat preferred Office [phone]   Richerd CROME Lakitha Gordy 12/27/2023, 2:23 PM

## 2023-12-27 NOTE — Progress Notes (Signed)
 PROGRESS NOTE    Nicholas Weiss  FMW:[id number] DOB: 10-04-1950 DOA: 12/24/2023 PCP: Alla Amis, MD   Brief Narrative:  This 73 yrs old Male with PMH significant for type 1 diabetes, hyperlipidemia, both subdural and intraventricular hemorrhage, Seizure disorder was sent from skilled nursing facility for altered mental status.  Patient was found to be confused, barely responsive.  Last known well time 10:45 AM.  He was found to be hypoglycemic,  blood glucose was 40,  Patient was given glucagon , subsequently staff noticed he still appeared to be altered from his baseline.  At baseline he is slow to respond but following commands.  Nursing home staff denies any fever, sore throat, cough, nausea, vomiting, or other sick contacts.  CT head: No acute intracranial abnormality. Left supraorbital scalp hematoma without underlying fracture.  CT C-spine :  No acute abnormality of the cervical spine.  Patient was admitted for further evaluation.  Neurology is consulted.  Assessment & Plan:   Principal Problem:   Acute metabolic encephalopathy Active Problems:   DKA, type 1 (HCC)   Intraventricular hemorrhage (HCC)   History of substance abuse (HCC)   Orthostatic hypotension   Dyslipidemia   CKD (chronic kidney disease) stage 3, GFR 30-59 ml/min (HCC)   Seizure disorder (HCC)   Multiple myeloma (HCC)   MCI (mild cognitive impairment) with memory loss   Acute encephalopathy likely multifactorial: Could be polypharmacy, Seizure ,  hypoglycemia or UTI. Patient was found to be confused, blood glucose found to be 40. Patient was given glucagon  with slight improvement. CT head / CT C-spine >No acute intracranial abnormality found. Obtain urine and urine culture.  Does not appear infection, no fever or leucocytosis. Obtain urine drug screen, No reports of substance use. EEG >moderate diffuse slowing indicative of global cerebral dysfunction, No evidence of seizures.   Hold all sedating  medications. Continue seizure precautions, fall precautions, aspiration precautions . Clear liquid diet.  Patient is alert and has been tolerating diet. Neurology consulted,   B12 and folate within normal limits Neurology has contacted the patient's nursing home staff, patient is at baseline. MRI order discontinued as patient has been refusing. PT and OT ordered for disposition.  Seizure disorder Continue Keppra  IV. EEG  > Moderate diffuse slowing indicative of global cerebral dysfunction, No evidence of seizures Seizure precautions, follow-up outpatient neurology.   Elevated troponin: EKG shows sinus rhythm, short PR interval, Unable to determine whether he actually has any pain. Troponin trended down 110> 79, likely demand ischemia. Patient is not a candidate for anticoagulation given history of ICH   Orthostatic hypotension: Patient remains on midodrine  and Florinef . Blood pressure has been elevated.  Hold on both. He may require IV hydralazine  as needed.   CKD stage IIIa: Serum creatinine at baseline.   Avoid nephrotoxic medications   Hyperlipidemia: Resume Lipitor  when patient is more awake and alert.   History of multiple myeloma: Follow-up outpatient.   Mild cognitive impairment: At baseline patient is able to follow commands, Patient is able to convey needs.   Type 1 diabetes with hypoglycemia: Hold on insulin  as patient has hypoglycemic episodes Monitor CBG and start sliding scale. Discontinue D5W,  start normal saline.   DVT prophylaxis: SCDs Code Status: Full code Family Communication:No family at bed side. Disposition Plan:     Status is: Inpatient Remains inpatient appropriate because:   Patient is now medically clear , pending insurance authorization.  PT/ OT for disposition.   Consultants:  Neurology  Procedures: CT Head /  MRI.  Antimicrobials:  Anti-infectives (From admission, onward)    None      Subjective: Patient was seen and  examined at bedside.  Overnight events noted. Patient is arousable, responds on calling his name and then go back to sleep. RN reported patient has eaten most of his meal.  MRI is canceled.   Objective: Vitals:   12/26/23 1953 12/27/23 0025 12/27/23 0440 12/27/23 0747  BP: 129/87 135/85 (!) 132/95 133/84  Pulse: 92   72  Resp: 19 18 20 20   Temp: 98.4 F (36.9 C) 98.4 F (36.9 C) 98.6 F (37 C) 97.9 F (36.6 C)  TempSrc: Oral Oral Oral Axillary  SpO2: 100%  100% 100%    Intake/Output Summary (Last 24 hours) at 12/27/2023 1053 Last data filed at 12/27/2023 9374 Gross per 24 hour  Intake 729.06 ml  Output 650 ml  Net 79.06 ml   There were no vitals filed for this visit.  Examination:  General exam: Appears calm and comfortable, arousable, not in any acute distress. Respiratory system: CTA Bilaterally. Respiratory effort normal.  RR 15 Cardiovascular system: S1 & S2 heard, RRR. No JVD, murmurs, rubs, gallops or clicks.  Gastrointestinal system: Abdomen is non distended, soft and non tender.  Normal bowel sounds heard. Central nervous system: Alert and oriented x 1. No focal neurological deficits. Extremities: No edema, no cyanosis, no clubbing. Skin: No rashes, lesions or ulcers Psychiatry:  Mood & affect appropriate.    Data Reviewed: I have personally reviewed following labs and imaging studies  CBC: Recent Labs  Lab 12/24/23 1437 12/24/23 1447 12/25/23 0455 12/26/23 0229 12/27/23 0325  WBC 3.9*  --  3.6* 3.5* 2.9*  NEUTROABS 2.2  --   --   --   --   HGB 8.0* 8.8*  8.8* 8.2* 8.0* 7.6*  HCT 25.1* 26.0*  26.0* 26.9* 23.9* 23.5*  MCV 101.6*  --  101.5* 96.0 95.9  PLT 253  --  269 284 183   Basic Metabolic Panel: Recent Labs  Lab 12/24/23 1437 12/24/23 1447 12/25/23 0455 12/26/23 0229 12/27/23 0053  NA 135 138  138 131* 133* 131*  K 4.2 4.5  4.5 4.1 4.2 4.0  CL 104 104 101 102 101  CO2 22  --  22 21* 20*  GLUCOSE 202* 214* 68* 194* 357*  BUN 27* 32*  18 20 31*  CREATININE 1.05 1.10 1.07 1.16 1.52*  CALCIUM  8.2*  --  8.2* 8.0* 8.0*  MG  --   --  1.6* 1.9  --   PHOS  --   --  3.0 3.5  --    GFR: CrCl cannot be calculated (Unknown ideal weight.). Liver Function Tests: Recent Labs  Lab 12/24/23 1437 12/25/23 0455  AST 31 24  ALT 39 31  ALKPHOS 97 96  BILITOT 0.5 0.4  PROT 8.0 7.5  ALBUMIN 2.6* 2.4*   No results for input(s): LIPASE, AMYLASE in the last 168 hours. Recent Labs  Lab 12/25/23 0455 12/26/23 1359  AMMONIA  48* 18   Coagulation Profile: No results for input(s): INR, PROTIME in the last 168 hours. Cardiac Enzymes: No results for input(s): CKTOTAL, CKMB, CKMBINDEX, TROPONINI in the last 168 hours. BNP (last 3 results) No results for input(s): PROBNP in the last 8760 hours. HbA1C: No results for input(s): HGBA1C in the last 72 hours. CBG: Recent Labs  Lab 12/26/23 2157 12/27/23 0005 12/27/23 0404 12/27/23 0748 12/27/23 0924  GLUCAP 429* 425* 192* 58* 78   Lipid Profile:  No results for input(s): CHOL, HDL, LDLCALC, TRIG, CHOLHDL, LDLDIRECT in the last 72 hours. Thyroid  Function Tests: Recent Labs    12/25/23 0455  TSH 4.132   Anemia Panel: Recent Labs    12/25/23 0455  VITAMINB12 608  FOLATE >20.0   Sepsis Labs: Recent Labs  Lab 12/24/23 1450  LATICACIDVEN 1.4    No results found for this or any previous visit (from the past 240 hours).   Radiology Studies: EEG adult Result Date: 12/25/2023 Matthews Elida HERO, MD     12/25/2023  3:46 PM Routine EEG Report Nicholas Weiss is a 73 y.o. male with a history of altered mental status who is undergoing an EEG to evaluate for seizures. Report: This EEG was acquired with electrodes placed according to the International 10-20 electrode system (including Fp1, Fp2, F3, F4, C3, C4, P3, P4, O1, O2, T3, T4, T5, T6, A1, A2, Fz, Cz, Pz). The following electrodes were missing or displaced: none. The occipital dominant rhythm was  5-6 Hz. This activity is reactive to stimulation. Drowsiness was manifested by background fragmentation; deeper stages of sleep were identified by K complexes and sleep spindles. There was no focal slowing. There were no interictal epileptiform discharges. There were no electrographic seizures identified. There was no abnormal response to photic stimulation or hyperventilation. Impression and clinical correlation: This EEG was obtained while awake and asleep and is abnormal due to moderate diffuse slowing indicative of global cerebral dysfunction. Epileptiform abnormalities were not seen during this recording. Elida Matthews, MD [facility] [phone] If 7pm- 7am, please page neurology on call as listed in AMION.   Scheduled Meds:  atorvastatin   80 mg Oral QHS   docusate sodium   100 mg Oral BID   insulin  aspart  0-6 Units Subcutaneous Q4H   levETIRAcetam   500 mg Intravenous Q12H   [START ON 01/02/2024] thiamine  (VITAMIN B1) injection  100 mg Intravenous Daily   Continuous Infusions:  sodium chloride  40 mL/hr at 12/27/23 9374   thiamine  (VITAMIN B1) injection 250 mg (12/27/23 0939)     LOS: 3 days    Time spent: 35 mins    Darcel Dawley, MD [facility]   If 7PM-7AM, please contact night-coverage

## 2023-12-27 NOTE — Evaluation (Signed)
 Occupational Therapy Evaluation Patient Details Name: Nicholas Weiss MRN: [id number] DOB: [birth year] Today's Date: 12/27/2023   History of Present Illness   73 y/o M presenting to ED on 9/26 from facility with AMS, found to be hypoglycemic. CT head with L supraorbital scalp heamtoma without underlying fx. PMH includes DM1, HLD, SDH, IVH, seziure disorder, mulitple myeloma, MCI, dyslipidemia, orthostatic hypotension, CKD III, L shoulder fx 10/2023     Clinical Impressions Pt from SNF, has assist at baseline with ADLs and functional mobility. Per prior notes pt with L humerus fx, has not been seen for f/u from orthopedics, family reports pt wearing sling at SNF, treated as NWB for evaluation. Pt currently needs up to max A for ADLs, mod-max+2 for bed mobility and max +2 for transfers with RW (RUE only on RW). Pt with SBP in 120s sitting EOB, decr to 110's with standing and pt endorses the room spinning. Pt presenting with impairments listed below, will follow acutely. Patient will benefit from continued inpatient follow up therapy, <3 hours/day to maximize safety/ind with ADL/functional mobility.      If plan is discharge home, recommend the following:   Two people to help with walking and/or transfers;A lot of help with bathing/dressing/bathroom;Assistance with cooking/housework;Direct supervision/assist for medications management;Direct supervision/assist for financial management;Assist for transportation;Help with stairs or ramp for entrance     Functional Status Assessment   Patient has had a recent decline in their functional status and demonstrates the ability to make significant improvements in function in a reasonable and predictable amount of time.     Equipment Recommendations   Hospital bed;Wheelchair cushion (measurements OT);Wheelchair (measurements OT);BSC/3in1     Recommendations for Other Services   PT consult     Precautions/Restrictions    Precautions Precautions: Fall Restrictions Weight Bearing Restrictions Per Provider Order: Yes LUE Weight Bearing Per Provider Order: Non weight bearing (assuming NWB since last imaging with fx and has not been seen by orthopedics for f/u) Other Position/Activity Restrictions: sling per notes, sling not present in room     Mobility Bed Mobility Overal bed mobility: Needs Assistance Bed Mobility: Supine to Sit, Sit to Supine     Supine to sit: Mod assist, +2 for physical assistance Sit to supine: Max assist, +2 for physical assistance   General bed mobility comments: helicopter method to avoid L shoulder WB    Transfers Overall transfer level: Needs assistance Equipment used: Rolling walker (2 wheels) (RUE on RW only) Transfers: Sit to/from Stand Sit to Stand: Max assist, +2 physical assistance           General transfer comment: heavy post and R lateral lean      Balance                                           ADL either performed or assessed with clinical judgement   ADL Overall ADL's : Needs assistance/impaired Eating/Feeding: Minimal assistance   Grooming: Minimal assistance   Upper Body Bathing: Moderate assistance   Lower Body Bathing: Maximal assistance   Upper Body Dressing : Moderate assistance   Lower Body Dressing: Maximal assistance   Toilet Transfer: Maximal assistance;+2 for physical assistance   Toileting- Clothing Manipulation and Hygiene: Maximal assistance       Functional mobility during ADLs: Maximal assistance;+2 for physical assistance       Vision   Additional Comments: will  further assess     Perception Perception: Not tested       Praxis Praxis: Not tested       Pertinent Vitals/Pain Pain Assessment Pain Assessment: No/denies pain     Extremity/Trunk Assessment Upper Extremity Assessment Upper Extremity Assessment: Right hand dominant;LUE deficits/detail LUE Deficits / Details: hx of L  proximal humerus fx from eariler this year, 2+/5 elbow and limtied wrist extension (does not follow command) , grasp 3/5, shoulder not assessed. mild edema LUE Sensation: decreased light touch;decreased proprioception LUE Coordination: decreased fine motor;decreased gross motor   Lower Extremity Assessment Lower Extremity Assessment: Defer to PT evaluation   Cervical / Trunk Assessment Cervical / Trunk Assessment: Normal   Communication Communication Communication: Impaired Factors Affecting Communication: Hearing impaired   Cognition Arousal: Alert Behavior During Therapy: Flat affect Cognition: Cognition impaired   Orientation impairments: Situation, Place Awareness: Online awareness impaired Memory impairment (select all impairments): Short-term memory Attention impairment (select first level of impairment): Sustained attention Executive functioning impairment (select all impairments): Problem solving, Initiation OT - Cognition Comments: did not recall son's name and could not identify who spouse was in room                 Following commands: Impaired Following commands impaired: Follows one step commands inconsistently     Cueing  General Comments   Cueing Techniques: Verbal cues  SBP with 15 pt drop from sitting to standing   Exercises     Shoulder Instructions      Home Living Family/patient expects to be discharged to:: Skilled nursing facility                                 Additional Comments: went home in august after hospitalization, went to [facility] and has been at [facility]      Prior Functioning/Environment Prior Level of Function : Needs assist             Mobility Comments: assist for mobility ADLs Comments: assist wtih ADLs    OT Problem List: Decreased range of motion;Decreased strength;Decreased activity tolerance;Impaired balance (sitting and/or standing);Decreased coordination;Decreased cognition;Decreased  safety awareness;Cardiopulmonary status limiting activity;Decreased knowledge of precautions   OT Treatment/Interventions: Self-care/ADL training;Therapeutic exercise;Energy conservation;DME and/or AE instruction;Therapeutic activities;Patient/family education;Balance training;Cognitive remediation/compensation      OT Goals(Current goals can be found in the care plan section)   Acute Rehab OT Goals Patient Stated Goal: none stated OT Goal Formulation: With patient Time For Goal Achievement: 01/10/24 Potential to Achieve Goals: Good ADL Goals Pt Will Perform Upper Body Dressing: sitting;with min assist Pt Will Perform Lower Body Dressing: sit to/from stand;sitting/lateral leans;with min assist Pt Will Transfer to Toilet: with min assist;bedside commode;stand pivot transfer   OT Frequency:  Min 2X/week    Co-evaluation PT/OT/SLP Co-Evaluation/Treatment: Yes Reason for Co-Treatment: Complexity of the patient's impairments (multi-system involvement);Necessary to address cognition/behavior during functional activity;To address functional/ADL transfers;For patient/therapist safety   OT goals addressed during session: ADL's and self-care;Strengthening/ROM      AM-PAC OT 6 Clicks Daily Activity     Outcome Measure Help from another person eating meals?: A Little Help from another person taking care of personal grooming?: A Little Help from another person toileting, which includes using toliet, bedpan, or urinal?: A Lot Help from another person bathing (including washing, rinsing, drying)?: A Lot Help from another person to put on and taking off regular upper body clothing?: A Lot Help from  another person to put on and taking off regular lower body clothing?: A Lot 6 Click Score: 14   End of Session Equipment Utilized During Treatment: Rolling walker (2 wheels);Gait belt Nurse Communication: Mobility status  Activity Tolerance: Patient tolerated treatment well Patient left: in  bed;with call bell/phone within reach;with bed alarm set;with family/visitor present  OT Visit Diagnosis: Unsteadiness on feet (R26.81);Other abnormalities of gait and mobility (R26.89);Muscle weakness (generalized) (M62.81)                Time: 8844-8773 OT Time Calculation (min): 31 min Charges:  OT General Charges $OT Visit: 1 Visit OT Evaluation $OT Eval Moderate Complexity: 1 Mod  Nicholas Weiss, OTD, OTR/L SecureChat Preferred Acute Rehab [phone]   Nicholas Weiss 12/27/2023, 12:58 PM

## 2023-12-28 LAB — CBC
HCT: 22.3 % — ABNORMAL LOW (ref 39.0–52.0)
Hemoglobin: 7.4 g/dL — ABNORMAL LOW (ref 13.0–17.0)
MCH: 31.1 pg (ref 26.0–34.0)
MCHC: 33.2 g/dL (ref 30.0–36.0)
MCV: 93.7 fL (ref 80.0–100.0)
Platelets: 252 K/uL (ref 150–400)
RBC: 2.38 MIL/uL — ABNORMAL LOW (ref 4.22–5.81)
RDW: 15.2 % (ref 11.5–15.5)
WBC: 2.5 K/uL — ABNORMAL LOW (ref 4.0–10.5)
nRBC: 0 % (ref 0.0–0.2)

## 2023-12-28 LAB — MAGNESIUM: Magnesium: 1.7 mg/dL (ref 1.7–2.4)

## 2023-12-28 LAB — URINALYSIS, W/ REFLEX TO CULTURE (INFECTION SUSPECTED)
Bilirubin Urine: NEGATIVE
Glucose, UA: >=500 mg/dL — AB
Hgb urine dipstick: NEGATIVE
Ketones, ur: NEGATIVE mg/dL
Nitrite: NEGATIVE
Protein, ur: NEGATIVE mg/dL
Specific Gravity, Urine: 1.012 (ref 1.005–1.030)
WBC, UA: >50 WBC/hpf (ref 0–5)
pH: 5 (ref 5.0–8.0)

## 2023-12-28 LAB — PHOSPHORUS: Phosphorus: 2.9 mg/dL (ref 2.5–4.6)

## 2023-12-28 LAB — BASIC METABOLIC PANEL WITH GFR
Anion gap: 9 (ref 5–15)
BUN: 25 mg/dL — ABNORMAL HIGH (ref 8–23)
CO2: 22 mmol/L (ref 22–32)
Calcium: 8 mg/dL — ABNORMAL LOW (ref 8.9–10.3)
Chloride: 102 mmol/L (ref 98–111)
Creatinine, Ser: 1.12 mg/dL (ref 0.61–1.24)
GFR, Estimated: >60 mL/min (ref >60)
Glucose, Bld: 172 mg/dL — ABNORMAL HIGH (ref 70–99)
Potassium: 4.3 mmol/L (ref 3.5–5.1)
Sodium: 133 mmol/L — ABNORMAL LOW (ref 135–145)

## 2023-12-28 LAB — GLUCOSE, CAPILLARY
Glucose-Capillary: 110 mg/dL — ABNORMAL HIGH (ref 70–99)
Glucose-Capillary: 124 mg/dL — ABNORMAL HIGH (ref 70–99)
Glucose-Capillary: 190 mg/dL — ABNORMAL HIGH (ref 70–99)
Glucose-Capillary: 234 mg/dL — ABNORMAL HIGH (ref 70–99)
Glucose-Capillary: 292 mg/dL — ABNORMAL HIGH (ref 70–99)

## 2023-12-28 LAB — RAPID URINE DRUG SCREEN, HOSP PERFORMED
Amphetamines: NOT DETECTED
Barbiturates: NOT DETECTED
Benzodiazepines: NOT DETECTED
Cocaine: NOT DETECTED
Opiates: NOT DETECTED
Tetrahydrocannabinol: NOT DETECTED

## 2023-12-28 MED ORDER — LEVETIRACETAM 500 MG PO TABS
500.0000 mg | ORAL_TABLET | Freq: Two times a day (BID) | ORAL | Status: DC
Start: 2023-12-28 — End: 2023-12-29
  Administered 2023-12-28 – 2023-12-29 (×2): 500 mg via ORAL
  Filled 2023-12-28 (×2): qty 1

## 2023-12-28 MED ORDER — THIAMINE MONONITRATE 100 MG PO TABS: 100.0000 mg | ORAL_TABLET | Freq: Every day | ORAL | Status: DC

## 2023-12-28 MED ORDER — ENSURE PLUS HIGH PROTEIN PO LIQD
237.0000 mL | Freq: Two times a day (BID) | ORAL | Status: DC
  Administered 2023-12-28 – 2023-12-29 (×3): 237 mL via ORAL

## 2023-12-28 MED ORDER — INSULIN GLARGINE 100 UNIT/ML ~~LOC~~ SOLN
5.0000 [IU] | Freq: Every day | SUBCUTANEOUS | Status: DC
  Administered 2023-12-28 – 2023-12-29 (×2): 5 [IU] via SUBCUTANEOUS
  Filled 2023-12-28 (×2): qty 0.05

## 2023-12-28 MED ORDER — THIAMINE MONONITRATE 100 MG PO TABS
250.0000 mg | ORAL_TABLET | Freq: Every day | ORAL | Status: DC
  Administered 2023-12-29: 250 mg via ORAL
  Filled 2023-12-28: qty 3

## 2023-12-28 NOTE — Progress Notes (Signed)
 PROGRESS NOTE    Nicholas Weiss  FMW:[id number] DOB: 12-May-1950 DOA: 12/24/2023 PCP: Alla Amis, MD   Brief Narrative:  This 73 yrs old Male with PMH significant for type 1 diabetes, hyperlipidemia, both subdural and intraventricular hemorrhage, Seizure disorder was sent from skilled nursing facility for altered mental status.  Patient was found to be confused, barely responsive.  Last known well time 10:45 AM.  He was found to be hypoglycemic,  blood glucose was 40,  Patient was given glucagon , subsequently staff noticed he still appeared to be altered from his baseline.  At baseline he is slow to respond but following commands.  Nursing home staff denies any fever, sore throat, cough, nausea, vomiting, or other sick contacts.  CT head: No acute intracranial abnormality. Left supraorbital scalp hematoma without underlying fracture.  CT C-spine :  No acute abnormality of the cervical spine.  Patient was admitted for further evaluation.  Neurology is consulted.  Assessment & Plan:   Principal Problem:   Acute metabolic encephalopathy Active Problems:   DKA, type 1 (HCC)   Intraventricular hemorrhage (HCC)   History of substance abuse (HCC)   Orthostatic hypotension   Dyslipidemia   CKD (chronic kidney disease) stage 3, GFR 30-59 ml/min (HCC)   Seizure disorder (HCC)   Multiple myeloma (HCC)   MCI (mild cognitive impairment) with memory loss   Acute encephalopathy likely multifactorial: Could be polypharmacy, Seizure ,  hypoglycemia or UTI. Patient was found to be confused, blood glucose found to be 40. Patient was given glucagon  with slight improvement. CT head / CT C-spine >No acute intracranial abnormality found. Obtain urine and urine culture, still pending . Does not appear infection, no fever or leucocytosis. EEG >moderate diffuse slowing indicative of global cerebral dysfunction, No evidence of seizures.   Hold all sedating medications. Continue seizure precautions,  fall precautions, aspiration precautions . Clear liquid diet.  Patient is alert and has been tolerating diet. Neurology consulted,   B12 and folate within normal limits Neurology has contacted the patient's nursing home staff, patient is at baseline. MRI order discontinued as patient has been refusing. PT and OT > SNF. Patient is back to his baseline mental status.  Medically clear,  awaiting SNF placement.  Seizure disorder Continue Keppra  IV. EEG  > Moderate diffuse slowing indicative of global cerebral dysfunction, No evidence of seizures Seizure precautions, follow-up outpatient neurology.   Elevated troponin: EKG shows sinus rhythm, short PR interval. Unable to determine whether he actually has any pain. Troponin trended down 110> 79, likely demand ischemia. Patient is not a candidate for anticoagulation given history of ICH   Orthostatic hypotension: Patient remains on midodrine  and Florinef . Blood pressure has been elevated.  Hold on both. He may require IV hydralazine  as needed. Resume Florinef  and midodrine  on discharge.   CKD stage IIIa: Serum creatinine at baseline.   Avoid nephrotoxic medications   Hyperlipidemia: Continue Lipitor  80 mg daily.   History of multiple myeloma: Follow-up outpatient.   Mild cognitive impairment: At baseline patient is able to follow commands, Patient is able to convey needs.    Type 1 diabetes with hypoglycemia: Hold on insulin  as patient has hypoglycemic episodes. Monitor CBG and start sliding scale. Discontinue D5W,  continue gentle hydration.   DVT prophylaxis: SCDs Code Status: Full code Family Communication:No family at bed side. Disposition Plan:     Status is: Inpatient Remains inpatient appropriate because:   Patient is now medically clear , pending insurance authorization.  PT/ OT for  disposition.   Consultants:  Neurology  Procedures: CT Head / MRI.  Antimicrobials:  Anti-infectives (From admission,  onward)    None      Subjective: Patient was seen and examined at bedside.  Overnight events noted. Patient is arousable, able to continue conversation.  He is at his baseline. RN reported patient has eaten most of his meal.  MRI is canceled.   Objective: Vitals:   12/27/23 2056 12/27/23 2353 12/28/23 0443 12/28/23 0730  BP: (!) 140/87 (!) 164/96 136/75 (!) 149/90  Pulse:  82 65 65  Resp:  15 17 17   Temp:  98.6 F (37 C) 98.3 F (36.8 C) 97.9 F (36.6 C)  TempSrc:  Oral Oral Oral  SpO2:  100% 99% 96%  Weight:   62.6 kg     Intake/Output Summary (Last 24 hours) at 12/28/2023 1314 Last data filed at 12/28/2023 0600 Gross per 24 hour  Intake 850.84 ml  Output 1850 ml  Net -999.16 ml   Filed Weights   12/28/23 0443  Weight: 62.6 kg    Examination:  General exam: Appears calm and comfortable, deconditioned, not in any acute distress. Respiratory system: CTA Bilaterally. Respiratory effort normal.  RR 14 Cardiovascular system: S1 & S2 heard, RRR. No JVD, murmurs, rubs, gallops or clicks.  Gastrointestinal system: Abdomen is non distended, soft and non tender.  Normal bowel sounds heard. Central nervous system: Alert and oriented x 1. No focal neurological deficits. Extremities: No edema, no cyanosis, no clubbing. Skin: No rashes, lesions or ulcers Psychiatry:  Mood & affect appropriate.    Data Reviewed: I have personally reviewed following labs and imaging studies  CBC: Recent Labs  Lab 12/24/23 1437 12/24/23 1447 12/25/23 0455 12/26/23 0229 12/27/23 0325 12/28/23 0312  WBC 3.9*  --  3.6* 3.5* 2.9* 2.5*  NEUTROABS 2.2  --   --   --   --   --   HGB 8.0* 8.8*  8.8* 8.2* 8.0* 7.6* 7.4*  HCT 25.1* 26.0*  26.0* 26.9* 23.9* 23.5* 22.3*  MCV 101.6*  --  101.5* 96.0 95.9 93.7  PLT 253  --  269 284 183 252   Basic Metabolic Panel: Recent Labs  Lab 12/24/23 1437 12/24/23 1447 12/25/23 0455 12/26/23 0229 12/27/23 0053 12/28/23 0312  NA 135 138  138 131*  133* 131* 133*  K 4.2 4.5  4.5 4.1 4.2 4.0 4.3  CL 104 104 101 102 101 102  CO2 22  --  22 21* 20* 22  GLUCOSE 202* 214* 68* 194* 357* 172*  BUN 27* 32* 18 20 31* 25*  CREATININE 1.05 1.10 1.07 1.16 1.52* 1.12  CALCIUM  8.2*  --  8.2* 8.0* 8.0* 8.0*  MG  --   --  1.6* 1.9  --  1.7  PHOS  --   --  3.0 3.5  --  2.9   GFR: Estimated Creatinine Clearance: 54.3 mL/min (by C-G formula based on SCr of 1.12 mg/dL). Liver Function Tests: Recent Labs  Lab 12/24/23 1437 12/25/23 0455  AST 31 24  ALT 39 31  ALKPHOS 97 96  BILITOT 0.5 0.4  PROT 8.0 7.5  ALBUMIN 2.6* 2.4*   No results for input(s): LIPASE, AMYLASE in the last 168 hours. Recent Labs  Lab 12/25/23 0455 12/26/23 1359  AMMONIA  48* 18   Coagulation Profile: No results for input(s): INR, PROTIME in the last 168 hours. Cardiac Enzymes: No results for input(s): CKTOTAL, CKMB, CKMBINDEX, TROPONINI in the last 168 hours. BNP (last  3 results) No results for input(s): PROBNP in the last 8760 hours. HbA1C: No results for input(s): HGBA1C in the last 72 hours. CBG: Recent Labs  Lab 12/27/23 1644 12/27/23 2011 12/28/23 0013 12/28/23 0411 12/28/23 1220  GLUCAP 177* 212* 124* 190* 292*   Lipid Profile: No results for input(s): CHOL, HDL, LDLCALC, TRIG, CHOLHDL, LDLDIRECT in the last 72 hours. Thyroid  Function Tests: No results for input(s): TSH, T4TOTAL, FREET4, T3FREE, THYROIDAB in the last 72 hours.  Anemia Panel: No results for input(s): VITAMINB12, FOLATE, FERRITIN, TIBC, IRON , RETICCTPCT in the last 72 hours.  Sepsis Labs: Recent Labs  Lab 12/24/23 1450  LATICACIDVEN 1.4    No results found for this or any previous visit (from the past 240 hours).   Radiology Studies: No results found.  Scheduled Meds:  atorvastatin   80 mg Oral QHS   docusate sodium   100 mg Oral BID   enoxaparin  (LOVENOX ) injection  40 mg Subcutaneous Q24H   feeding supplement  237  mL Oral BID BM   insulin  aspart  0-6 Units Subcutaneous Q4H   insulin  glargine  5 Units Subcutaneous Daily   levETIRAcetam   500 mg Oral BID   [START ON 12/29/2023] thiamine   250 mg Oral Daily   Followed by   NOREEN ON 01/02/2024] thiamine   100 mg Oral Daily   Continuous Infusions:     LOS: 4 days    Time spent: 35 mins    Darcel Dawley, MD [facility]   If 7PM-7AM, please contact night-coverage

## 2023-12-28 NOTE — TOC Progression Note (Addendum)
 Transition of Care [facility]) - Progression Note    Patient Details  Name: Nicholas Weiss MRN: [id number] Date of Birth: 08/08/1950  [facility] CM/SW Contact  Luise JAYSON Pan, [place] Phone Number: 12/28/2023, 10:37 AM  Clinical Narrative:   CSW checked home and community care/navi portal for patients insurance auth. Status of shara is still pending.   2:02 PM Auth approved for [facility]. Approval dates 12/28/2023-12/30/2023. CSW notified SNF, awaiting response on if facility can accept today. CSW notified patients friend, [name].   3:10 PM Per MD, patient not medically ready for discharge today. CSW updated patients friend and facility.   CSW will continue to follow.    Expected Discharge Plan: Skilled Nursing Facility Barriers to Discharge: Continued Medical Work up               Expected Discharge Plan and Services In-house Referral: Clinical Social Work     Living arrangements for the past 2 months: Skilled Nursing Facility (Originally from home but went ot SNF rehab on 8/29)                                       Social Drivers of Health (SDOH) Interventions SDOH Screenings   Food Insecurity: Patient Unable To Answer (12/26/2023)  Housing: Unknown (11/16/2023)  Transportation Needs: No Transportation Needs (11/16/2023)  Utilities: Not At Risk (11/16/2023)  Alcohol Screen: Low Risk  (10/02/2020)  Depression (PHQ2-9): Low Risk  (12/08/2023)  Financial Resource Strain: Low Risk  (10/20/2023)   Received from [facility]  Physical Activity: Insufficiently Active (01/01/2020)  Social Connections: Moderately Isolated (11/16/2023)  Stress: No Stress Concern Present (01/01/2020)  Tobacco Use: Low Risk  (12/24/2023)  Recent Concern: Tobacco Use - Medium Risk (12/22/2023)   Received from [facility]    Readmission Risk Interventions    02/16/2023    3:56 PM 08/24/2022   10:57 AM 05/20/2022   11:41 AM  Readmission Risk Prevention  Plan  Transportation Screening Complete Complete Complete  Medication Review Oceanographer) Complete Complete Complete  PCP or Specialist appointment within 3-5 days of discharge Complete Complete Complete  HRI or Home Care Consult Complete Complete Complete  SW Recovery Care/Counseling Consult Not Complete    Palliative Care Screening Not Applicable  Complete  Skilled Nursing Facility Complete Complete Complete

## 2023-12-29 ENCOUNTER — Encounter: Payer: Self-pay | Admitting: Oncology

## 2023-12-29 LAB — GLUCOSE, CAPILLARY
Glucose-Capillary: 124 mg/dL — ABNORMAL HIGH (ref 70–99)
Glucose-Capillary: 133 mg/dL — ABNORMAL HIGH (ref 70–99)
Glucose-Capillary: 177 mg/dL — ABNORMAL HIGH (ref 70–99)
Glucose-Capillary: 194 mg/dL — ABNORMAL HIGH (ref 70–99)
Glucose-Capillary: 220 mg/dL — ABNORMAL HIGH (ref 70–99)
Glucose-Capillary: 59 mg/dL — ABNORMAL LOW (ref 70–99)
Glucose-Capillary: 68 mg/dL — ABNORMAL LOW (ref 70–99)

## 2023-12-29 LAB — CBC
HCT: 24.9 % — ABNORMAL LOW (ref 39.0–52.0)
Hemoglobin: 8.3 g/dL — ABNORMAL LOW (ref 13.0–17.0)
MCH: 31.6 pg (ref 26.0–34.0)
MCHC: 33.3 g/dL (ref 30.0–36.0)
MCV: 94.7 fL (ref 80.0–100.0)
Platelets: 279 K/uL (ref 150–400)
RBC: 2.63 MIL/uL — ABNORMAL LOW (ref 4.22–5.81)
RDW: 15.3 % (ref 11.5–15.5)
WBC: 3.7 K/uL — ABNORMAL LOW (ref 4.0–10.5)
nRBC: 0 % (ref 0.0–0.2)

## 2023-12-29 MED ORDER — DOCUSATE SODIUM 100 MG PO CAPS: 100.0000 mg | ORAL_CAPSULE | Freq: Two times a day (BID) | ORAL | 0 refills | Status: DC

## 2023-12-29 MED ORDER — DEXTROSE 50 % IV SOLN
1.0000 | Freq: Once | INTRAVENOUS | Status: AC
Start: 2023-12-29 — End: 2023-12-29
  Administered 2023-12-29: 50 mL via INTRAVENOUS
  Filled 2023-12-29: qty 50

## 2023-12-29 MED ORDER — POLYETHYLENE GLYCOL 3350 17 G PO PACK: 17.0000 g | PACK | Freq: Every day | ORAL | 0 refills | Status: DC | PRN

## 2023-12-29 NOTE — Plan of Care (Signed)
  Problem: Clinical Measurements: Goal: Will remain free from infection Outcome: Progressing Goal: Respiratory complications will improve Outcome: Progressing Goal: Cardiovascular complication will be avoided Outcome: Progressing   Problem: Elimination: Goal: Will not experience complications related to bowel motility Outcome: Progressing Goal: Will not experience complications related to urinary retention Outcome: Progressing   Problem: Safety: Goal: Ability to remain free from injury will improve Outcome: Progressing

## 2023-12-29 NOTE — TOC Transition Note (Signed)
 Transition of Care [facility]) - Discharge Note   Patient Details  Name: Nicholas Weiss MRN: [id number] Date of Birth: Apr 09, 1950  [facility]) CM/SW Contact:  Luise JAYSON Pan, LCSWA Phone Number: 12/29/2023, 1:14 PM   Clinical Narrative:   Patient will DC to: [facility]  Anticipated DC date: 12/29/23  Family notified: [name] (friend)  Transport by: [facility]   Per MD patient ready for DC to [facility] care. RN to call report prior to discharge [phone]; room 124B). RN, patient, patient's family, and facility notified of DC. Discharge Summary and FL2 sent to facility. DC packet on chart. Ambulance transport requested for patient 1:12PM.   CSW will sign off for now as social work intervention is no longer needed. Please consult us  again if new needs arise.   Final next level of care: Skilled Nursing Facility Barriers to Discharge: Barriers Resolved   Patient Goals and CMS Choice Patient states their goals for this hospitalization and ongoing recovery are:: Go back to rehab          Discharge Placement   Existing PASRR number confirmed : 12/27/23          Patient chooses bed at: [facility] Patient to be transferred to facility by: [facility] Name of family member notified: [name] (friend) Patient and family notified of of transfer: 12/29/23  Discharge Plan and Services Additional resources added to the After Visit Summary for   In-house Referral: Clinical Social Work                                   Social Drivers of Health (SDOH) Interventions SDOH Screenings   Food Insecurity: Patient Unable To Answer (12/26/2023)  Housing: Unknown (11/16/2023)  Transportation Needs: No Transportation Needs (11/16/2023)  Utilities: Not At Risk (11/16/2023)  Alcohol Screen: Low Risk  (10/02/2020)  Depression (PHQ2-9): Low Risk  (12/08/2023)  Financial Resource Strain: Low Risk  (10/20/2023)   Received [facility]  Physical  Activity: Insufficiently Active (01/01/2020)  Social Connections: Moderately Isolated (11/16/2023)  Stress: No Stress Concern Present (01/01/2020)  Tobacco Use: Low Risk  (12/24/2023)  Recent Concern: Tobacco Use - Medium Risk (12/22/2023)   Received from [facility]     Readmission Risk Interventions    02/16/2023    3:56 PM 08/24/2022   10:57 AM 05/20/2022   11:41 AM  Readmission Risk Prevention Plan  Transportation Screening Complete Complete Complete  Medication Review Oceanographer) Complete Complete Complete  PCP or Specialist appointment within 3-5 days of discharge Complete Complete Complete  HRI or Home Care Consult Complete Complete Complete  SW Recovery Care/Counseling Consult Not Complete    Palliative Care Screening Not Applicable  Complete  Skilled Nursing Facility Complete Complete Complete

## 2023-12-29 NOTE — Progress Notes (Signed)
 AVS in packet for transportaion

## 2023-12-29 NOTE — Plan of Care (Signed)

## 2023-12-29 NOTE — Progress Notes (Signed)
 Patient Discharged and transported by [facility]. Gave Report to [facility] health care, to Nurse Antoinette.

## 2023-12-29 NOTE — Discharge Summary (Signed)
 Physician Discharge Summary  Nicholas Weiss FMW:[id number] DOB: [birth year] DOA: 12/24/2023  PCP: Alla Amis, MD  Admit date: 12/24/2023 Discharge date: 12/29/2023  Admitted From: Nursing home Disposition: Nursing home Recommendations for Outpatient Follow-up:  Follow up with PCP in 1-2 weeks Please obtain BMP/CBC in one week Please monitor his blood sugar closely  Home Health: None  Equipment/Devices none  Discharge Condition: Stable CODE STATUS: Full code Diet recommendation: Cardiac Brief/Interim Summary:  73 yrs old Male with PMH significant for type 1 diabetes, hyperlipidemia, both subdural and intraventricular hemorrhage, Seizure disorder was sent from skilled nursing facility for altered mental status.  Patient was found to be confused, barely responsive.  Last known well time 10:45 AM.  He was found to be hypoglycemic,  blood glucose was 40,  Patient was given glucagon , subsequently staff noticed he still appeared to be altered from his baseline.  At baseline he is slow to respond but following commands.  Nursing home staff denies any fever, sore throat, cough, nausea, vomiting, or other sick contacts.  CT head: No acute intracranial abnormality. Left supraorbital scalp hematoma without underlying fracture.  CT C-spine :  No acute abnormality of the cervical spine.  Patient was admitted for further evaluation.  Neurology is consulted.  Discharge Diagnoses:  Principal Problem:   Acute metabolic encephalopathy Active Problems:   DKA, type 1 (HCC)   Intraventricular hemorrhage (HCC)   History of substance abuse (HCC)   Orthostatic hypotension   Dyslipidemia   CKD (chronic kidney disease) stage 3, GFR 30-59 ml/min (HCC)   Seizure disorder (HCC)   Multiple myeloma (HCC)   MCI (mild cognitive impairment) with memory loss  Acute encephalopathy likely multifactorial: Could have been polypharmacy, Seizure ,  hypoglycemia or UTI. Patient was found to be confused, blood  glucose found to be 40. Patient was given glucagon  with slight improvement. CT head / CT C-spine >No acute intracranial abnormality found.  Urine culture pending at the time of discharge. Does not appear infection, no fever or leucocytosis. EEG >moderate diffuse slowing indicative of global cerebral dysfunction, No evidence of seizures.  Continue Keppra . Continue gabapentin  if he was taking that prior to admission. Continue seizure precautions, fall precautions, aspiration precautions Patient is alert and has been tolerating diet. Neurology consulted,   B12 and folate within normal limits.  Seen by PT OT recommended SNF on discharge.   Seizure disorder continue Keppra . Continue gabapentin  if he was taking that prior to admission. Seizure precautions, follow-up outpatient neurology.   Elevated troponin: EKG shows sinus rhythm, short PR interval. Unable to determine whether he actually has any pain. Troponin trended down 110> 79, likely demand ischemia. Patient is not a candidate for anticoagulation given history of ICH   Orthostatic hypotension: Continue midodrine    CKD stage IIIa: Serum creatinine at baseline.   Avoid nephrotoxic medications   Hyperlipidemia: Continue Lipitor  80 mg daily.   History of multiple myeloma: Follow-up outpatient.   Mild cognitive impairment: At baseline patient is able to follow commands, Patient is able to convey needs.     Type 1 diabetes with hypoglycemia: Stopped Lantus .  Continue sliding scale insulin .  We have to avoid hypoglycemic episodes with history of seizure.  He was treated with D5 and glucagon .    Estimated body mass index is 19.8 kg/m as calculated from the following:   Height as of 12/08/23: 5' 10 (1.778 m).   Weight as of this encounter: 62.6 kg.  Discharge Instructions  Discharge Instructions  Diet - low sodium heart healthy   Complete by: As directed    Increase activity slowly   Complete by: As directed        Allergies as of 12/29/2023       Reactions   Penicillins Other (See Comments)   Childhood allergy -tolerated amoxil  03/2022 Unknown reaction Has patient had a PCN reaction causing immediate rash, facial/tongue/throat swelling, SOB or lightheadedness with hypotension: No Has patient had a PCN reaction causing severe rash involving mucus membranes or skin necrosis: No Has patient had a PCN reaction that required hospitalization No Has patient had a PCN reaction occurring within the last 10 years: No If all of the above answers are NO, then may proceed with Cephalosporin use.        Medication List     STOP taking these medications    acyclovir  400 MG tablet Commonly known as: ZOVIRAX    insulin  glargine 100 UNIT/ML injection Commonly known as: LANTUS        TAKE these medications    acetaminophen  325 MG tablet Commonly known as: TYLENOL  Take 2 tablets (650 mg total) by mouth every 6 (six) hours as needed for mild pain (pain score 1-3) or fever.   atorvastatin  80 MG tablet Commonly known as: LIPITOR  Take 1 tablet (80 mg total) by mouth at bedtime. Hold while taking Paxlovid    Dexcom G7 Sensor Misc 1 each by Does not apply route 4 (four) times daily -  before meals and at bedtime.   docusate sodium  100 MG capsule Commonly known as: COLACE Take 1 capsule (100 mg total) by mouth 2 (two) times daily.   Fe Fum-Vit C-Vit B12-FA Caps capsule Commonly known as: TRIGELS-F FORTE Take 1 capsule by mouth 2 (two) times daily.   gabapentin  300 MG capsule Commonly known as: NEURONTIN  Take 1 capsule (300 mg total) by mouth 2 (two) times daily.   insulin  lispro 100 UNIT/ML injection Commonly known as: HUMALOG Inject 0-12 Units into the skin 4 (four) times daily -  with meals and at bedtime. Inject as per sliding scale: If 0-110 = 0; 111-150 = 2; 151-200 = 4; 201-250 = 6; 301-350 = 8; 351-400 = 10, subcutaneously before meals and bedtime for diabetes. If >400, please give 12  units and contact provider.   latanoprost  0.005 % ophthalmic solution Commonly known as: XALATAN  Place 1 drop into both eyes at bedtime.   levETIRAcetam  500 MG tablet Commonly known as: KEPPRA  Take 1 tablet by mouth twice daily   midodrine  2.5 MG tablet Commonly known as: PROAMATINE  Take 1 tablet (2.5 mg total) by mouth 3 (three) times daily as needed (For systolic blood pressure less than 100). What changed: when to take this   multivitamin with minerals Tabs tablet Take 1 tablet by mouth daily. What changed: when to take this   polyethylene glycol 17 g packet Commonly known as: MIRALAX  / GLYCOLAX  Take 17 g by mouth daily as needed for mild constipation.   Pomalyst  2 MG capsule Generic drug: pomalidomide  TAKE 1 CAPSULE BY MOUTH EVERY DAY FOR 21 DAYS, THEN HOLD FOR 7 DAYS. REPEAT EVERY 28 DAYS What changed: See the new instructions.        Follow-up Information     Alla Amis, MD Follow up.   Specialty: Family Medicine Contact information: [address] [facility] [place] [address] [phone]                Allergies  Allergen Reactions   Penicillins  Other (See Comments)    Childhood allergy -tolerated amoxil  03/2022 Unknown reaction Has patient had a PCN reaction causing immediate rash, facial/tongue/throat swelling, SOB or lightheadedness with hypotension: No Has patient had a PCN reaction causing severe rash involving mucus membranes or skin necrosis: No Has patient had a PCN reaction that required hospitalization No Has patient had a PCN reaction occurring within the last 10 years: No If all of the above answers are NO, then may proceed with Cephalosporin use.     Consultations: neuro   Procedures/Studies: EEG adult Result Date: 12/25/2023 Matthews Elida HERO, MD     12/25/2023  3:46 PM Routine EEG Report HYDEN SOLEY is a 73 y.o. male with a history of altered mental status who is undergoing an EEG to evaluate  for seizures. Report: This EEG was acquired with electrodes placed according to the International 10-20 electrode system (including Fp1, Fp2, F3, F4, C3, C4, P3, P4, O1, O2, T3, T4, T5, T6, A1, A2, Fz, Cz, Pz). The following electrodes were missing or displaced: none. The occipital dominant rhythm was 5-6 Hz. This activity is reactive to stimulation. Drowsiness was manifested by background fragmentation; deeper stages of sleep were identified by K complexes and sleep spindles. There was no focal slowing. There were no interictal epileptiform discharges. There were no electrographic seizures identified. There was no abnormal response to photic stimulation or hyperventilation. Impression and clinical correlation: This EEG was obtained while awake and asleep and is abnormal due to moderate diffuse slowing indicative of global cerebral dysfunction. Epileptiform abnormalities were not seen during this recording. Elida Matthews, MD [facility] [phone] If 7pm- 7am, please page neurology on call as listed in AMION.   CT Cervical Spine Wo Contrast Result Date: 12/24/2023 EXAM: CT CERVICAL SPINE WITHOUT CONTRAST 12/24/2023 03:32:00 PM TECHNIQUE: CT of the cervical spine was performed without the administration of intravenous contrast. Multiplanar reformatted images are provided for review. Automated exposure control, iterative reconstruction, and/or weight based adjustment of the mA/kV was utilized to reduce the radiation dose to as low as reasonably achievable. COMPARISON: CT of the cervical spine 11/15/2023. CLINICAL HISTORY: Neck trauma (Age >= 65y). Chief complaints include altered mental status, hypoglycemic, and baseline dysphagia. The patient has a baseline headache since a head bleed in August from a fall and a left shoulder fracture from a previous fall. FINDINGS: CERVICAL SPINE: BONES AND ALIGNMENT: No acute fracture or traumatic malalignment. DEGENERATIVE CHANGES: Multilevel degenerative changes  are again noted in the cervical spine. SOFT TISSUES: No prevertebral soft tissue swelling. Atherosclerotic calcifications are present at the carotid bifurcations bilaterally without significant change. Stenosis is suspected. IMPRESSION: 1. No acute abnormality of the cervical spine related to the reported neck trauma. 2. Multilevel degenerative changes in the cervical spine. Electronically signed by: Lonni Necessary MD 12/24/2023 04:08 PM EDT RP Workstation: [id number]   CT Maxillofacial Wo Contrast Result Date: 12/24/2023 EXAM: CT OF THE FACE WITHOUT CONTRAST 12/24/2023 03:32:00 PM TECHNIQUE: CT of the face was performed without the administration of intravenous contrast. Multiplanar reformatted images are provided for review. Automated exposure control, iterative reconstruction, and/or weight based adjustment of the mA/kV was utilized to reduce the radiation dose to as low as reasonably achievable. COMPARISON: CT head without contrast 12/08/2023. CLINICAL HISTORY: Facial trauma, blunt. Altered mental status, hypoglycemic, improved with glucagon . Baseline dysphagia, headache from a head bleed in August, and a left shoulder fracture from a previous fall. FINDINGS: FACIAL BONES: No acute facial fracture. No underlying fracture or foreign body is present  in the region of the left supraorbital scalp hematoma. No mandibular dislocation. No suspicious bone lesion. ORBITS: Globes are intact. No acute traumatic injury. No inflammatory change. SINUSES AND MASTOIDS: Minimal mucosal thickening is present in the left maxillary sinus. SOFT TISSUES: Left supraorbital scalp hematoma is present. IMPRESSION: 1. Left supraorbital scalp hematoma without underlying fracture or foreign body. 2. Minimal mucosal thickening in the left maxillary sinus. Electronically signed by: Lonni Necessary MD 12/24/2023 04:04 PM EDT RP Workstation: [id number]   CT Head Wo Contrast Result Date: 12/24/2023 EXAM: CT HEAD WITHOUT CONTRAST  12/24/2023 03:32:00 PM TECHNIQUE: CT of the head was performed without the administration of intravenous contrast. Automated exposure control, iterative reconstruction, and/or weight based adjustment of the mA/kV was utilized to reduce the radiation dose to as low as reasonably achievable. COMPARISON: CT head without contrast 12/08/2023. CLINICAL HISTORY: Mental status change, unknown cause. Chief complaints: Altered Mental Status. Last known well 1045. Presents with AMS, hypoglycemic. Got glucagon  x1 at facility mental status improved. Alert to self. Baseline dysphagia. EMS VS: 196/110 80bpm CBG 296 20RR. GCS 14. Baseline headache since head bleed in August from fall. L shoulder fx from previous fall. FINDINGS: BRAIN AND VENTRICLES: No acute hemorrhage. No evidence of acute infarct. No hydrocephalus. No extra-axial collection. No mass effect or midline shift. Dense foci of ossification are noted along the tentorium and intracerebral falx. Atrophy and white matter changes are stable. Atherosclerotic calcifications are present in the cavernous carotid arteries bilaterally. No hyperdense vessel is present. ORBITS: No acute abnormality. SINUSES: No acute abnormality. SOFT TISSUES AND SKULL: Left supraorbital scalp hematoma is present without underlying fracture. No skull fracture. IMPRESSION: 1. No acute intracranial abnormality. 2. Left supraorbital scalp hematoma without underlying fracture. Electronically signed by: Lonni Necessary MD 12/24/2023 04:01 PM EDT RP Workstation: [id number]   DG Chest Port 1 View Result Date: 12/24/2023 CLINICAL DATA:  Altered level of consciousness EXAM: PORTABLE CHEST 1 VIEW COMPARISON:  11/15/2023 FINDINGS: Single frontal view of the chest demonstrates mild enlargement of the cardiac silhouette. There is bibasilar consolidation, greatest in the left lower lobe. Small left pleural effusion is noted. No pneumothorax. There are no acute displaced fractures. Further displacement of  the known proximal left humeral fracture since prior exam. Healing right lateral eighth rib fracture with moderate callus formation. No acute fractures. IMPRESSION: 1. Bibasilar consolidation, left greater than right, consistent with infection, aspiration, or asymmetric edema. 2. Small left pleural effusion. 3. Enlarged cardiac silhouette. 4. Further displacement and impaction of the known proximal left humeral fracture. Subacute healing right lateral eighth rib fracture. Electronically Signed   By: Ozell Daring M.D.   On: 12/24/2023 15:10   CT HEAD WO CONTRAST ( ) Result Date: 12/08/2023 CLINICAL DATA:  73 year old male code stroke presentation last month. Frequent falls. Confusion. EXAM: CT HEAD WITHOUT CONTRAST TECHNIQUE: Contiguous axial images were obtained from the base of the skull through the vertex without intravenous contrast. RADIATION DOSE REDUCTION: This exam was performed according to the departmental dose-optimization program which includes automated exposure control, adjustment of the mA and/or kV according to patient size and/or use of iterative reconstruction technique. COMPARISON:  Brain MRI 11/16/2023.  Head CT 11/20/2023 and earlier. FINDINGS: Study is intermittently degraded by motion artifact despite repeated imaging attempts. Brain: Detail limited by motion. Largely resolved right lateral intraventricular hemorrhage since 11/20/2023, small volume residual visible near the right foramen of Monro series 6, image 14. No interval ventriculomegaly. No transependymal edema. No new or progressive intracranial hemorrhage is identified. Incidental  dural calcifications. No midline shift. Basilar cisterns appear stable and normal on series 9, image 37. Grossly stable gray-white differentiation. Vascular: Detail limited by motion. Skull: Detail limited by motion, grossly stable and intact. Sinuses/Orbits: Visualized paranasal sinuses and mastoids are stable and well aerated. Other: Orbit and scalp  detail limited by motion, grossly stable since last month. IMPRESSION: 1. Motion degraded exam despite repeated imaging attempts. 2. Largely resolved intraventricular hemorrhage since 11/20/2023. 3. No new intracranial abnormality is identified. Electronically Signed   By: VEAR Hurst M.D.   On: 12/08/2023 12:40   (Echo, Carotid, EGD, Colonoscopy, ERCP)    Subjective:  Awake he was given D50 for low blood sugar   Discharge Exam: Vitals:   12/29/23 0440 12/29/23 0735  BP: 134/82 137/79  Pulse:  100  Resp:  16  Temp:  97.8 F (36.6 C)  SpO2:  97%   Vitals:   12/29/23 0341 12/29/23 0352 12/29/23 0440 12/29/23 0735  BP: (!) 174/114 (!) 174/111 134/82 137/79  Pulse: 93   100  Resp: 18   16  Temp: 98.5 F (36.9 C)   97.8 F (36.6 C)  TempSrc: Oral   Oral  SpO2: 100%   97%  Weight:        General: Pt is alert, awake, not in acute distress Cardiovascular: RRR, S1/S2 +, no rubs, no gallops Respiratory: CTA bilaterally, no wheezing, no rhonchi Abdominal: Soft, NT, ND, bowel sounds + Extremities: no edema, no cyanosis    The results of significant diagnostics from this hospitalization (including imaging, microbiology, ancillary and laboratory) are listed below for reference.     Microbiology: No results found for this or any previous visit (from the past 240 hours).   Labs: BNP (last 3 results) No results for input(s): BNP in the last 8760 hours. Basic Metabolic Panel: Recent Labs  Lab 12/24/23 1437 12/24/23 1447 12/25/23 0455 12/26/23 0229 12/27/23 0053 12/28/23 0312  NA 135 138  138 131* 133* 131* 133*  K 4.2 4.5  4.5 4.1 4.2 4.0 4.3  CL 104 104 101 102 101 102  CO2 22  --  22 21* 20* 22  GLUCOSE 202* 214* 68* 194* 357* 172*  BUN 27* 32* 18 20 31* 25*  CREATININE 1.05 1.10 1.07 1.16 1.52* 1.12  CALCIUM  8.2*  --  8.2* 8.0* 8.0* 8.0*  MG  --   --  1.6* 1.9  --  1.7  PHOS  --   --  3.0 3.5  --  2.9   Liver Function Tests: Recent Labs  Lab 12/24/23 1437  12/25/23 0455  AST 31 24  ALT 39 31  ALKPHOS 97 96  BILITOT 0.5 0.4  PROT 8.0 7.5  ALBUMIN 2.6* 2.4*   No results for input(s): LIPASE, AMYLASE in the last 168 hours. Recent Labs  Lab 12/25/23 0455 12/26/23 1359  AMMONIA  48* 18   CBC: Recent Labs  Lab 12/24/23 1437 12/24/23 1447 12/25/23 0455 12/26/23 0229 12/27/23 0325 12/28/23 0312 12/29/23 0657  WBC 3.9*  --  3.6* 3.5* 2.9* 2.5* 3.7*  NEUTROABS 2.2  --   --   --   --   --   --   HGB 8.0*   < > 8.2* 8.0* 7.6* 7.4* 8.3*  HCT 25.1*   < > 26.9* 23.9* 23.5* 22.3* 24.9*  MCV 101.6*  --  101.5* 96.0 95.9 93.7 94.7  PLT 253  --  269 284 183 252 279   < > = values in this  interval not displayed.   Cardiac Enzymes: No results for input(s): CKTOTAL, CKMB, CKMBINDEX, TROPONINI in the last 168 hours. BNP: Invalid input(s): POCBNP CBG: Recent Labs  Lab 12/28/23 2351 12/29/23 0016 12/29/23 0222 12/29/23 0345 12/29/23 0808  GLUCAP 68* 59* 177* 133* 124*   D-Dimer No results for input(s): DDIMER in the last 72 hours. Hgb A1c No results for input(s): HGBA1C in the last 72 hours. Lipid Profile No results for input(s): CHOL, HDL, LDLCALC, TRIG, CHOLHDL, LDLDIRECT in the last 72 hours. Thyroid  function studies No results for input(s): TSH, T4TOTAL, T3FREE, THYROIDAB in the last 72 hours.  Invalid input(s): FREET3 Anemia work up No results for input(s): VITAMINB12, FOLATE, FERRITIN, TIBC, IRON , RETICCTPCT in the last 72 hours. Urinalysis    Component Value Date/Time   COLORURINE YELLOW 12/28/2023 1855   APPEARANCEUR HAZY (A) 12/28/2023 1855   LABSPEC 1.012 12/28/2023 1855   PHURINE 5.0 12/28/2023 1855   GLUCOSEU >=500 (A) 12/28/2023 1855   HGBUR NEGATIVE 12/28/2023 1855   BILIRUBINUR NEGATIVE 12/28/2023 1855   KETONESUR NEGATIVE 12/28/2023 1855   PROTEINUR NEGATIVE 12/28/2023 1855   NITRITE NEGATIVE 12/28/2023 1855   LEUKOCYTESUR LARGE (A) 12/28/2023 1855    Sepsis Labs Recent Labs  Lab 12/26/23 0229 12/27/23 0325 12/28/23 0312 12/29/23 0657  WBC 3.5* 2.9* 2.5* 3.7*   Microbiology No results found for this or any previous visit (from the past 240 hours).   Time coordinating discharge: 49 min SIGNED:   Almarie KANDICE Hoots, MD  [facility] 12/29/2023, 10:57 AM

## 2023-12-30 LAB — URINE CULTURE: Culture: >=100000 — AB

## 2023-12-31 LAB — VITAMIN B1: Vitamin B1 (Thiamine): 86.7 nmol/L (ref 66.5–200.0)

## 2024-01-03 ENCOUNTER — Emergency Department (HOSPITAL_COMMUNITY)
Admission: EM | Admit: 2024-01-03 | Discharge: 2024-01-04 | Disposition: A | Discharge status: Skilled Nursing Facility | Attending: Emergency Medicine | Admitting: Emergency Medicine

## 2024-01-03 ENCOUNTER — Encounter (HOSPITAL_COMMUNITY): Payer: Self-pay

## 2024-01-03 ENCOUNTER — Other Ambulatory Visit: Payer: Self-pay

## 2024-01-03 DIAGNOSIS — R9431 Abnormal electrocardiogram [ECG] [EKG]: Secondary | ICD-10-CM | POA: Insufficient documentation

## 2024-01-03 DIAGNOSIS — W19XXXA Unspecified fall, initial encounter: Secondary | ICD-10-CM

## 2024-01-03 DIAGNOSIS — S0990XA Unspecified injury of head, initial encounter: Secondary | ICD-10-CM | POA: Diagnosis present

## 2024-01-03 DIAGNOSIS — Z043 Encounter for examination and observation following other accident: Secondary | ICD-10-CM | POA: Diagnosis not present

## 2024-01-03 DIAGNOSIS — I700 Atherosclerosis of aorta: Secondary | ICD-10-CM | POA: Diagnosis not present

## 2024-01-03 DIAGNOSIS — Z9181 History of falling: Secondary | ICD-10-CM | POA: Diagnosis present

## 2024-01-03 NOTE — ED Triage Notes (Addendum)
 Arrives [facility] from [facility] following an unwitnessed fall. Reported staff found pt on his knees on the ground. Pt says he fell trying to get out of  bed. C-collar present on arrival. Correct alignment.   Normal mentation. A/ox1. GCS - 14.   Denies LOC , denies anticoagulants.   Reported history of brain bleed.   Covid (+)

## 2024-01-04 ENCOUNTER — Emergency Department (HOSPITAL_COMMUNITY)

## 2024-01-04 LAB — TROPONIN I (HIGH SENSITIVITY)
Troponin I (High Sensitivity): 23 ng/L — ABNORMAL HIGH (ref <18)
Troponin I (High Sensitivity): 21 ng/L — ABNORMAL HIGH (ref <18)

## 2024-01-04 LAB — CBC WITH DIFFERENTIAL/PLATELET
Abs Immature Granulocytes: 0.02 K/uL (ref 0.00–0.07)
Basophils Absolute: 0 K/uL (ref 0.0–0.1)
Basophils Relative: 1 %
Eosinophils Absolute: 0.1 K/uL (ref 0.0–0.5)
Eosinophils Relative: 2 %
HCT: 27.3 % — ABNORMAL LOW (ref 39.0–52.0)
Hemoglobin: 8.4 g/dL — ABNORMAL LOW (ref 13.0–17.0)
Immature Granulocytes: 0 %
Lymphocytes Relative: 23 %
Lymphs Abs: 1.3 K/uL (ref 0.7–4.0)
MCH: 32.2 pg (ref 26.0–34.0)
MCHC: 30.8 g/dL (ref 30.0–36.0)
MCV: 104.6 fL — ABNORMAL HIGH (ref 80.0–100.0)
Monocytes Absolute: 0.5 K/uL (ref 0.1–1.0)
Monocytes Relative: 9 %
Neutro Abs: 3.7 K/uL (ref 1.7–7.7)
Neutrophils Relative %: 65 %
Platelets: 270 K/uL (ref 150–400)
RBC: 2.61 MIL/uL — ABNORMAL LOW (ref 4.22–5.81)
RDW: 15.9 % — ABNORMAL HIGH (ref 11.5–15.5)
WBC: 5.8 K/uL (ref 4.0–10.5)
nRBC: 0.3 % — ABNORMAL HIGH (ref 0.0–0.2)

## 2024-01-04 LAB — BASIC METABOLIC PANEL WITH GFR
Anion gap: 11 (ref 5–15)
BUN: 34 mg/dL — ABNORMAL HIGH (ref 8–23)
CO2: 21 mmol/L — ABNORMAL LOW (ref 22–32)
Calcium: 8.3 mg/dL — ABNORMAL LOW (ref 8.9–10.3)
Chloride: 105 mmol/L (ref 98–111)
Creatinine, Ser: 1.24 mg/dL (ref 0.61–1.24)
GFR, Estimated: >60 mL/min (ref >60)
Glucose, Bld: 172 mg/dL — ABNORMAL HIGH (ref 70–99)
Potassium: 4.7 mmol/L (ref 3.5–5.1)
Sodium: 137 mmol/L (ref 135–145)

## 2024-01-04 LAB — CBG MONITORING, ED: Glucose-Capillary: 390 mg/dL — ABNORMAL HIGH (ref 70–99)

## 2024-01-04 NOTE — ED Notes (Signed)
 Attempted to re-stick pt was unsuccessful this time. Recent site I obtained blood was not working

## 2024-01-04 NOTE — ED Notes (Signed)
 PTAR here for pt. Pt alert, NAD, calm, interactive.

## 2024-01-04 NOTE — ED Notes (Signed)
 Stuck patient twice unable to collect labs RN aware

## 2024-01-04 NOTE — ED Notes (Signed)
 Patient transported to X-ray

## 2024-01-04 NOTE — ED Provider Notes (Signed)
 [facility]-EMERGENCY DEPT [facility] Emergency Department Provider Note MRN:  [id number]  Arrival date & time: 01/04/24     Chief Complaint   Fall   History of Present Illness   Nicholas Weiss is a 73 y.o. year-old male presents to the ED with chief complaint of fall out of bed.  Reportedly landed on his knees.  He was placed in a c-collar by EMS.  He has known left humerus fracture.  He denies headache or head injury.  Denies chest pain or abdominal pain.  Denies any pain in his hips or pelvis.  Denies any treatments prior to arrival..  History provided by patient.   Review of Systems  Pertinent positive and negative review of systems noted in HPI.    Physical Exam   Vitals:   01/03/24 2357 01/04/24 0408  BP: (!) 163/103 (!) 143/85  Pulse: 92 (!) 103  Resp: 18 16  Temp: 98.2 F (36.8 C) 98.3 F (36.8 C)  SpO2: 98% 100%    CONSTITUTIONAL:  non toxic-appearing, NAD NEURO:  Alert and oriented x 3, CN 3-12 grossly intact EYES:  eyes equal and reactive ENT/NECK:  Supple, no stridor  CARDIO:  normal rate, regular rhythm, appears well-perfused  PULM:  No respiratory distress, CTAB GI/GU:  non-distended,  MSK/SPINE:  No gross deformities, no edema, moves all extremities  SKIN:  no rash, , atraumatic   *Additional and/or pertinent findings included in MDM below  Diagnostic and Interventional Summary    EKG Interpretation Date/Time:    Ventricular Rate:    PR Interval:    QRS Duration:    QT Interval:    QTC Calculation:   R Axis:      Text Interpretation:         Labs Reviewed  BASIC METABOLIC PANEL WITH GFR - Abnormal; Notable for the following components:      Result Value   CO2 21 (*)    Glucose, Bld 172 (*)    BUN 34 (*)    Calcium  8.3 (*)    All other components within normal limits  CBC WITH DIFFERENTIAL/PLATELET - Abnormal; Notable for the following components:   RBC 2.61 (*)    Hemoglobin 8.4 (*)    HCT 27.3 (*)    MCV 104.6 (*)    RDW 15.9  (*)    nRBC 0.3 (*)    All other components within normal limits  TROPONIN I (HIGH SENSITIVITY) - Abnormal; Notable for the following components:   Troponin I (High Sensitivity) 23 (*)    All other components within normal limits  TROPONIN I (HIGH SENSITIVITY) - Abnormal; Notable for the following components:   Troponin I (High Sensitivity) 21 (*)    All other components within normal limits  CBC WITH DIFFERENTIAL/PLATELET  URINALYSIS, ROUTINE W REFLEX MICROSCOPIC    CT HEAD WO CONTRAST ( )  Final Result    CT Cervical Spine Wo Contrast  Final Result    DG Pelvis 1-2 Views  Final Result    DG Chest 1 View  Final Result      Medications - No data to display   Procedures  /  Critical Care Procedures  ED Course and Medical Decision Making  I have reviewed the triage vital signs, the nursing notes, and pertinent available records from the EMR.  Social Determinants Affecting Complexity of Care: Patient has problems living alone.   ED Course:    Medical Decision Making Patient here from facility after a fall out of  bed.  Reportedly landed on his knees, but no evidence of traumatic injury to the knees.  He has normal range of motion of the hips and knees bilaterally.  Labs and imaging are reassuring.  No new acute traumatic findings.  Feel that patient can be safely discharged back to his facility.  Amount and/or Complexity of Data Reviewed Labs: ordered. Radiology: ordered. ECG/medicine tests: ordered.         Consultants: No consultations were needed in caring for this patient.   Treatment and Plan: I considered admission due to patient's initial presentation, but after considering the examination and diagnostic results, patient will not require admission and can be discharged with outpatient follow-up.    Final Clinical Impressions(s) / ED Diagnoses     ICD-10-CM   1. Fall, initial encounter  W19.[facility]       ED Discharge Orders     None          Discharge Instructions Discussed with and Provided to Patient:    Discharge Instructions      No new injuries were found tonight.  Please follow-up with your regular doctor.      Vicky Charleston, PA-C 01/04/24 9380    Theadore Ozell HERO, MD 01/04/24 [phone]

## 2024-01-04 NOTE — Discharge Instructions (Signed)
 No new injuries were found tonight.  Please follow-up with your regular doctor.

## 2024-01-05 ENCOUNTER — Encounter: Payer: Self-pay | Admitting: Oncology

## 2024-01-05 ENCOUNTER — Inpatient Hospital Stay: Admitting: Oncology

## 2024-01-05 ENCOUNTER — Inpatient Hospital Stay

## 2024-01-05 ENCOUNTER — Inpatient Hospital Stay: Attending: Oncology

## 2024-01-12 ENCOUNTER — Encounter: Payer: Self-pay | Admitting: Oncology

## 2024-01-16 ENCOUNTER — Other Ambulatory Visit: Payer: Self-pay

## 2024-01-17 ENCOUNTER — Other Ambulatory Visit: Payer: Self-pay | Admitting: Internal Medicine

## 2024-01-19 ENCOUNTER — Telehealth: Payer: Self-pay

## 2024-01-19 ENCOUNTER — Other Ambulatory Visit: Payer: Self-pay

## 2024-01-19 DIAGNOSIS — E1040 Type 1 diabetes mellitus with diabetic neuropathy, unspecified: Secondary | ICD-10-CM

## 2024-01-19 NOTE — Progress Notes (Signed)
 Complex Care Management Note Care Guide Note  01/19/2024 Name: Nicholas Weiss MRN: [id number] DOB: 09/13/1950   Complex Care Management Outreach Attempts: An unsuccessful telephone outreach was attempted today to offer the patient information about available complex care management services.  Follow Up Plan:  Additional outreach attempts will be made to offer the patient complex care management information and services.   Encounter Outcome:  No Answer  Jeoffrey Buffalo , RMA     [place]  [facility], [facility] Guide  Direct Dial: [phone]  Website: [email or web address]

## 2024-01-20 NOTE — Progress Notes (Signed)
 Complex Care Management Note Care Guide Note  01/20/2024 Name: Nicholas Weiss MRN: [id number] DOB: 04-14-1950   Complex Care Management Outreach Attempts: A second unsuccessful outreach was attempted today to offer the patient with information about available complex care management services.  Follow Up Plan:  Additional outreach attempts will be made to offer the patient complex care management information and services.   Encounter Outcome:  No Answer  Jeoffrey Buffalo , RMA     [place]  [facility], [facility] Guide  Direct Dial: [phone]  Website: [email or web address]

## 2024-01-21 ENCOUNTER — Other Ambulatory Visit: Payer: Self-pay

## 2024-01-25 ENCOUNTER — Other Ambulatory Visit: Payer: Self-pay

## 2024-01-25 NOTE — Progress Notes (Signed)
 Complex Care Management Note Care Guide Note  01/25/2024 Name: Nicholas Weiss MRN: [id number] DOB: 19-Jul-1950   Complex Care Management Outreach Attempts: A third unsuccessful outreach was attempted today to offer the patient with information about available complex care management services.  Follow Up Plan:  No further outreach attempts will be made at this time. We have been unable to contact the patient to offer or enroll patient in complex care management services.  Encounter Outcome:  No Answer  Jeoffrey Buffalo , RMA     [place]  [facility], [facility] Guide  Direct Dial: [phone]  Website: [email or web address]

## 2024-01-26 DIAGNOSIS — Z862 Personal history of diseases of the blood and blood-forming organs and certain disorders involving the immune mechanism: Secondary | ICD-10-CM | POA: Insufficient documentation

## 2024-01-26 DIAGNOSIS — R5381 Other malaise: Secondary | ICD-10-CM | POA: Insufficient documentation

## 2024-01-27 ENCOUNTER — Other Ambulatory Visit: Payer: Self-pay

## 2024-01-28 ENCOUNTER — Encounter: Payer: Self-pay | Admitting: Oncology

## 2024-01-28 NOTE — Telephone Encounter (Signed)
 Reason for Call Medication Management He wants to know when he should start his Pomalyst .  His next appointment is August 13

## 2024-01-29 ENCOUNTER — Other Ambulatory Visit: Payer: Self-pay

## 2024-02-01 ENCOUNTER — Other Ambulatory Visit: Payer: Self-pay

## 2024-02-04 ENCOUNTER — Other Ambulatory Visit: Payer: Self-pay

## 2024-02-07 ENCOUNTER — Telehealth: Payer: Self-pay | Admitting: Oncology

## 2024-02-07 NOTE — Telephone Encounter (Signed)
 Pt and pt friend called to r/s missed appts - spoke w/clinical team and notified pt and pt friend that we would get back with them tomorrow (11/11) - LH

## 2024-02-08 ENCOUNTER — Other Ambulatory Visit: Payer: Self-pay

## 2024-02-08 ENCOUNTER — Telehealth: Payer: Self-pay

## 2024-02-08 DIAGNOSIS — C9000 Multiple myeloma not having achieved remission: Secondary | ICD-10-CM

## 2024-02-08 NOTE — Telephone Encounter (Signed)
 Per Dr. Melanee See me on Monday, 02/14/2024 with CBC with differential CMP myeloma panel and serum free light chains and possible Retacrit .

## 2024-02-08 NOTE — Telephone Encounter (Signed)
 Please advise on appt scheduling.  HH nurse calling on behalf of the patient to get appts scheduled.  Patient states they have been trying to get appts rescheduled but no one has returned his call.

## 2024-02-09 ENCOUNTER — Other Ambulatory Visit: Payer: Self-pay

## 2024-02-13 ENCOUNTER — Other Ambulatory Visit: Payer: Self-pay

## 2024-02-13 ENCOUNTER — Other Ambulatory Visit: Payer: Self-pay | Admitting: Internal Medicine

## 2024-02-14 ENCOUNTER — Other Ambulatory Visit: Payer: Self-pay

## 2024-02-14 ENCOUNTER — Inpatient Hospital Stay (HOSPITAL_BASED_OUTPATIENT_CLINIC_OR_DEPARTMENT_OTHER): Admitting: Oncology

## 2024-02-14 ENCOUNTER — Inpatient Hospital Stay

## 2024-02-14 ENCOUNTER — Encounter: Payer: Self-pay | Admitting: Oncology

## 2024-02-14 ENCOUNTER — Inpatient Hospital Stay: Attending: Oncology

## 2024-02-14 VITALS — BP 122/70 | HR 65 | Temp 92.8°F | Resp 15 | Ht 70.0 in | Wt 136.7 lb

## 2024-02-14 DIAGNOSIS — Z79899 Other long term (current) drug therapy: Secondary | ICD-10-CM

## 2024-02-14 DIAGNOSIS — N1831 Chronic kidney disease, stage 3a: Secondary | ICD-10-CM | POA: Diagnosis present

## 2024-02-14 DIAGNOSIS — I129 Hypertensive chronic kidney disease with stage 1 through stage 4 chronic kidney disease, or unspecified chronic kidney disease: Secondary | ICD-10-CM | POA: Insufficient documentation

## 2024-02-14 DIAGNOSIS — C9000 Multiple myeloma not having achieved remission: Secondary | ICD-10-CM

## 2024-02-14 DIAGNOSIS — D631 Anemia in chronic kidney disease: Secondary | ICD-10-CM | POA: Diagnosis not present

## 2024-02-14 DIAGNOSIS — Z7961 Long term (current) use of immunomodulator: Secondary | ICD-10-CM | POA: Insufficient documentation

## 2024-02-14 DIAGNOSIS — D649 Anemia, unspecified: Secondary | ICD-10-CM

## 2024-02-14 DIAGNOSIS — D638 Anemia in other chronic diseases classified elsewhere: Secondary | ICD-10-CM

## 2024-02-14 LAB — CMP (CANCER CENTER ONLY)
ALT: 25 U/L (ref 0–44)
AST: 25 U/L (ref 15–41)
Albumin: 2.7 g/dL — ABNORMAL LOW (ref 3.5–5.0)
Alkaline Phosphatase: 83 U/L (ref 38–126)
Anion gap: 8 (ref 5–15)
BUN: 19 mg/dL (ref 8–23)
CO2: 26 mmol/L (ref 22–32)
Calcium: 8.3 mg/dL — ABNORMAL LOW (ref 8.9–10.3)
Chloride: 104 mmol/L (ref 98–111)
Creatinine: 1.08 mg/dL (ref 0.61–1.24)
GFR, Estimated: >60 mL/min (ref >60)
Glucose, Bld: 128 mg/dL — ABNORMAL HIGH (ref 70–99)
Potassium: 4.2 mmol/L (ref 3.5–5.1)
Sodium: 138 mmol/L (ref 135–145)
Total Bilirubin: 0.5 mg/dL (ref 0.0–1.2)
Total Protein: 9.2 g/dL — ABNORMAL HIGH (ref 6.5–8.1)

## 2024-02-14 LAB — CBC WITH DIFFERENTIAL (CANCER CENTER ONLY)
Abs Immature Granulocytes: 0.01 K/uL (ref 0.00–0.07)
Basophils Absolute: 0 K/uL (ref 0.0–0.1)
Basophils Relative: 0 %
Eosinophils Absolute: 0.1 K/uL (ref 0.0–0.5)
Eosinophils Relative: 3 %
HCT: 26.5 % — ABNORMAL LOW (ref 39.0–52.0)
Hemoglobin: 8.1 g/dL — ABNORMAL LOW (ref 13.0–17.0)
Immature Granulocytes: 0 %
Lymphocytes Relative: 33 %
Lymphs Abs: 1 K/uL (ref 0.7–4.0)
MCH: 30.5 pg (ref 26.0–34.0)
MCHC: 30.6 g/dL (ref 30.0–36.0)
MCV: 99.6 fL (ref 80.0–100.0)
Monocytes Absolute: 0.2 K/uL (ref 0.1–1.0)
Monocytes Relative: 5 %
Neutro Abs: 1.9 K/uL (ref 1.7–7.7)
Neutrophils Relative %: 59 %
Platelet Count: 264 K/uL (ref 150–400)
RBC: 2.66 MIL/uL — ABNORMAL LOW (ref 4.22–5.81)
RDW: 15.7 % — ABNORMAL HIGH (ref 11.5–15.5)
WBC Count: 3.2 K/uL — ABNORMAL LOW (ref 4.0–10.5)
nRBC: 0 % (ref 0.0–0.2)

## 2024-02-14 MED ORDER — POMALIDOMIDE 2 MG PO CAPS: 2.0000 mg | ORAL_CAPSULE | Freq: Every day | ORAL | 0 refills | Status: DC

## 2024-02-14 MED ORDER — EPOETIN ALFA-EPBX 40000 UNIT/ML IJ SOLN
40000.0000 [IU] | INTRAMUSCULAR | Status: DC
  Administered 2024-02-14: 40000 [IU] via SUBCUTANEOUS
  Filled 2024-02-14: qty 1

## 2024-02-14 NOTE — Progress Notes (Signed)
 Constipation.  Temp 92.3 (checked twice). Taking an octagonal shaped pink pill, Ac...SABRASABRA

## 2024-02-14 NOTE — Progress Notes (Signed)
 Hematology/Oncology Consult note [facility]  Telephone:([phone] [phone]  Patient Care Team: Alla Amis, MD as PCP - General (Family Medicine) Pa, [facility] (Optometry) Melanee Annah BROCKS, MD as Consulting Physician (Oncology)   Name of the patient: Nicholas Weiss  [id number]  [birth year]   Date of visit: 02/14/24  Diagnosis-  high risk IgG lambda multiple myeloma R-ISS stage III   Chief complaint/ Reason for visit-posthospital discharge follow-up for multiple myeloma  Heme/Onc history: Patient is a 73 year old African-American male with a past medical history significant for uncontrolled type 1 diabetes, chronic kidney disease stage III chronic normocytic anemia referred for abnormal SPEP. Patient's creatinine has been fluctuating between 1.5-2 but about 5 months ago it went up all the way to 5 and his blood sugars were in the 1000 range. More recently his kidney numbers have been drifting back to normal values. As a part of the work-up he had serum protein electrophoresis done which showed an elevated gammaglobulin fraction with an M spike of 3%. The amount of M spike was not quantified in the specimen. He has therefore been referred to us  for further management. Patient endorses chronic fatigue reports that his appetite and weight have remained stable   Results of blood work from 04/08/2020 were as follows: CBC showed white count of 2.9, H&H of 10.5/31.2 with an MCV of 91 and a platelet count of 191.  CMP showed a mildly elevated creatinine of 1.2 which was better as compared to 5 months ago when it was 3.9.  Total protein was mildly elevated at 9.1 calcium  normal at 8.4 ferritin and iron  studies B12 folate TSH and haptoglobin were normal.  Myeloma panel revealed an elevated IgG level of 06/04/2004 with an M protein of 3.1 g.  Immunofixation showed IgG lambda specificity.  Serum free light chain ratio was elevated at 25 and free light chain  lambda elevated at 370   Further myeloma work-up including a PET CT scan did not reveal any evidence of edematous lesions.  Bone marrow biopsy showed 23% plasma cells by manual count and 30% by CD138 IHC.  Normal cytogenetics.  FISH studies for myeloma showed gain of 1 q.  13 q-. detected.  P53 not detected.   He was treated for a year and has having smoldering multiple myeloma since both CKD and anemia have been stable for 3 to 4 years and could be secondary to uncontrolled diabetes.In June 2023 IgG levels increased to 5463 with an M protein of 4.1 g as compared to 2.6 g in September 2022.  Serum free light chain ratio remains around 25.  Therefore a repeat bone marrow biopsy was done which shows further increase in plasma cells ranging from 30 to 70% and by immunohistochemistry 60 to 70% of the cells were positive for CD138.  Cytogenetics normal.  FISH study showed 4: 14 translocation and gain of 1 q. making this high risk RISS stage III.  Repeat PET scan showed no lytic lesions   Patient is not a transplant candidate and was started on Velcade  Revlimid  dexamethasone  regimen in July 2023.  He was also referred to [facility] for second opinion but did not go.  Treatment complicated by repeated hospitalizations for uncontrolled blood sugars.  He has been frequently hospitalized thereby causing multiple interruptions in his treatment. Due to lack of adequate response to Vrd darzalex  was added in may 2024. M protein remained around 2 g despite adding Darzalex  to the regimen and therefore  plan to switch to Pomalyst  plus Darzalex  and stop Velcade  and Revlimid  altogether starting August 2024  Interval history- Discussed the use of AI scribe software for clinical note transcription with the patient, who gave verbal consent to proceed.   Nicholas Weiss is a 73 year old male with multiple myeloma who presents for follow-up after hospitalization for a brain bleed.  He was last seen in September, shortly after  being discharged from the hospital. His last treatment for myeloma was in August, and he was unable to attend a follow-up appointment in October due to his condition. Since his discharge, he has been residing at home after a stay in a skilled nursing facility. He remains mostly bed-bound, requiring assistance to get up and sit in a chair. No new falls have been reported.  He was previously on Pomalyst  (pomalidomide ) and received monthly Darzalex  (daratumumab ) injections for his myeloma. It is unclear if he has any remaining Pomalyst  medication.  He also has a history of anemia, for which he has received EPO injections in the past. His current hemoglobin level is 8.1.       ECOG PS- 3 Pain scale- 3 Opioid associated constipation- no  Review of systems- Review of Systems  Constitutional:  Positive for malaise/fatigue.      Allergies  Allergen Reactions   Penicillins Other (See Comments)    Childhood allergy -tolerated amoxil  03/2022 Unknown reaction Has patient had a PCN reaction causing immediate rash, facial/tongue/throat swelling, SOB or lightheadedness with hypotension: No Has patient had a PCN reaction causing severe rash involving mucus membranes or skin necrosis: No Has patient had a PCN reaction that required hospitalization No Has patient had a PCN reaction occurring within the last 10 years: No If all of the above answers are NO, then may proceed with Cephalosporin use.      Past Medical History:  Diagnosis Date   Acute metabolic encephalopathy 12/02/2020   DKA (diabetic ketoacidoses) 04/06/2016   Hypercholesteremia    Hypertension      Past Surgical History:  Procedure Laterality Date   AMPUTATION TOE Left 04/25/2022   Procedure: LEFT GREAT TOE AMPUTATION AND LEFT PARTIAL 2ND TOE AMPUTATION;  Surgeon: Janit Thresa HERO, DPM;  Location: [facility] ORS;  Service: Podiatry;  Laterality: Left;   COLONOSCOPY WITH PROPOFOL  N/A 02/01/2020   Procedure: COLONOSCOPY WITH PROPOFOL ;   Surgeon: Unk Corinn Skiff, MD;  Location: [facility] ENDOSCOPY;  Service: Gastroenterology;  Laterality: N/A;   ESOPHAGOGASTRODUODENOSCOPY  02/01/2020   Procedure: ESOPHAGOGASTRODUODENOSCOPY (EGD);  Surgeon: Unk Corinn Skiff, MD;  Location: [facility] ENDOSCOPY;  Service: Gastroenterology;;   ESOPHAGOGASTRODUODENOSCOPY (EGD) WITH PROPOFOL  N/A 06/07/2022   Procedure: ESOPHAGOGASTRODUODENOSCOPY (EGD) WITH PROPOFOL ;  Surgeon: Saintclair Jasper, MD;  Location: [facility] ENDOSCOPY;  Service: Gastroenterology;  Laterality: N/A;   KNEE SURGERY Right    Torn meniscus   KNEE SURGERY Left     Social History   Socioeconomic History   Marital status: Single    Spouse name: Not on file   Number of children: 3   Years of education: Not on file   Highest education level: High school graduate  Occupational History    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  Tobacco Use   Smoking status: Never   Smokeless tobacco: Never  Vaping Use   Vaping status: Never Used  Substance and Sexual Activity   Alcohol use: Not Currently    Alcohol/week: 0.0 - 1.0 standard drinks of alcohol    Comment: once every 2 months   Drug use:  Yes    Types: Marijuana, Crack cocaine    Comment: last week   Sexual activity: Not Currently    Birth control/protection: None  Other Topics Concern   Not on file  Social History Narrative   Lives with girlfriend sharon   Social Drivers of Health   Financial Resource Strain: Low Risk  (10/20/2023)   Received from [facility]   Overall Financial Resource Strain (CARDIA)    Difficulty of Paying Living Expenses: Not hard at all  Food Insecurity: Patient Unable To Answer (12/26/2023)   Hunger Vital Sign    Worried About Running Out of Food in the Last Year: Patient unable to answer    Ran Out of Food in the Last Year: Patient unable to answer  Transportation Needs: No Transportation Needs (11/16/2023)   PRAPARE - Administrator, Civil Service (Medical): No    Lack of  Transportation (Non-Medical): No  Physical Activity: Insufficiently Active (01/01/2020)   Exercise Vital Sign    Days of Exercise per Week: 7 days    Minutes of Exercise per Session: 20 min  Stress: No Stress Concern Present (01/01/2020)   [employer] of Occupational Health - Occupational Stress Questionnaire    Feeling of Stress : Not at all  Social Connections: Moderately Isolated (11/16/2023)   Social Connection and Isolation Panel    Frequency of Communication with Friends and Family: More than three times a week    Frequency of Social Gatherings with Friends and Family: More than three times a week    Attends Religious Services: Never    Database Administrator or Organizations: No    Attends Banker Meetings: Never    Marital Status: Living with partner  Intimate Partner Violence: Not At Risk (12/26/2023)   Humiliation, Afraid, Rape, and Kick questionnaire    Fear of Current or Ex-Partner: No    Emotionally Abused: No    Physically Abused: No    Sexually Abused: No    Family History  Problem Relation Age of Onset   Heart attack Father    Hypertension Sister    Cancer Sister      Current Outpatient Medications:    acetaminophen  (TYLENOL ) 325 MG tablet, Take 2 tablets (650 mg total) by mouth every 6 (six) hours as needed for mild pain (pain score 1-3) or fever., Disp: , Rfl:    atorvastatin  (LIPITOR ) 80 MG tablet, Take 1 tablet (80 mg total) by mouth at bedtime. Hold while taking Paxlovid , Disp: 30 tablet, Rfl: 2   docusate sodium  (COLACE) 100 MG capsule, Take 1 capsule (100 mg total) by mouth 2 (two) times daily., Disp: 10 capsule, Rfl: 0   Fe Fum-Vit C-Vit B12-FA (TRIGELS-F FORTE) CAPS capsule, Take 1 capsule by mouth 2 (two) times daily., Disp: , Rfl:    gabapentin  (NEURONTIN ) 300 MG capsule, Take 1 capsule (300 mg total) by mouth 2 (two) times daily., Disp: 60 capsule, Rfl: 0   insulin  lispro (HUMALOG) 100 UNIT/ML injection, Inject 0-12 Units into the skin  4 (four) times daily -  with meals and at bedtime. Inject as per sliding scale:  If 150-200=    0 units 201-250=       3 units 251-300=       5 units 301-350=      7 units 351-400=      9 units For blood sugar >400, give 10 units and notify MD/NP (Patient taking differently: Inject 0-12 Units into the  skin 4 (four) times daily -  with meals and at bedtime. Inject as per sliding scale:  If 150-200=    0 units 201-250=       3 units 251-300=       5 units 301-350=      7 units 351-400=      9 units For blood sugar >400, give 10 units and notify MD/NP), Disp: , Rfl:    latanoprost  (XALATAN ) 0.005 % ophthalmic solution, Place 1 drop into both eyes at bedtime., Disp: , Rfl:    levETIRAcetam  (KEPPRA ) 500 MG tablet, Take 1 tablet by mouth twice daily, Disp: 60 tablet, Rfl: 0   midodrine  (PROAMATINE ) 2.5 MG tablet, Take 1 tablet (2.5 mg total) by mouth 3 (three) times daily as needed (For systolic blood pressure less than 100)., Disp: , Rfl:    Multiple Vitamin (MULTIVITAMIN WITH MINERALS) TABS tablet, Take 1 tablet by mouth daily., Disp: , Rfl:    polyethylene glycol (MIRALAX  / GLYCOLAX ) 17 g packet, Take 17 g by mouth daily as needed for mild constipation., Disp: 14 each, Rfl: 0   pomalidomide  (POMALYST ) 2 MG capsule, Take 1 capsule (2 mg total) by mouth daily. [employer] Auth [id number] Date Obtained 02/14/24, Disp: 21 capsule, Rfl: 0 No current facility-administered medications for this visit.  Facility-Administered Medications Ordered in Other Visits:    epoetin  alfa-epbx (RETACRIT ) injection 40,000 Units, 40,000 Units, Subcutaneous, Q21 days, Melanee Annah BROCKS, MD, 40,000 Units at 07/09/23 1153   epoetin  alfa-epbx (RETACRIT ) injection 40,000 Units, 40,000 Units, Subcutaneous, Q21 days, Melanee Annah BROCKS, MD, 40,000 Units at 02/14/24 1002  Physical exam:  Vitals:   02/14/24 0943  BP: 122/70  Pulse: 65  Resp: 15  Temp: (!) 92.8 F (33.8 C)  TempSrc: Tympanic  SpO2: 100%  Weight: 136 lb 11.2 oz (62 kg)   Height: 5' 10 (1.778 m)   Physical Exam Constitutional:      Comments: Sitting in a wheelchair and does not verbalize much today  Eyes:     Pupils: Pupils are equal, round, and reactive to light.  Cardiovascular:     Rate and Rhythm: Normal rate and regular rhythm.     Heart sounds: Normal heart sounds.  Pulmonary:     Effort: Pulmonary effort is normal.     Breath sounds: Normal breath sounds.  Skin:    General: Skin is warm and dry.  Neurological:     Mental Status: He is alert and oriented to person, place, and time.      I have personally reviewed labs listed below:    Latest Ref Rng & Units 02/14/2024    8:56 AM  CMP  Glucose 70 - 99 mg/dL 871   BUN 8 - 23 mg/dL 19   Creatinine 9.38 - 1.24 mg/dL 8.91   Sodium 864 - 854 mmol/L 138   Potassium 3.5 - 5.1 mmol/L 4.2   Chloride 98 - 111 mmol/L 104   CO2 22 - 32 mmol/L 26   Calcium  8.9 - 10.3 mg/dL 8.3   Total Protein 6.5 - 8.1 g/dL 9.2   Total Bilirubin 0.0 - 1.2 mg/dL 0.5   Alkaline Phos 38 - 126 U/L 83   AST 15 - 41 U/L 25   ALT 0 - 44 U/L 25       Latest Ref Rng & Units 02/14/2024    8:56 AM  CBC  WBC 4.0 - 10.5 K/uL 3.2   Hemoglobin 13.0 - 17.0 g/dL 8.1   Hematocrit  39.0 - 52.0 % 26.5   Platelets 150 - 400 K/uL 264     Assessment and plan- Patient is a 73 y.o. male with history of high risk IgG lambda multiple myeloma R-ISS stage III on Pomalyst  and Darzalex  therapy here for routine follow-up  Assessment and Plan    Multiple myeloma, not having achieved remission Multiple myeloma not in remission. Previous treatment with Pomalyst  and Darzalex . He is bed-bound and requires assistance for mobility. He is willing to continue treatment to prevent progression and potential kidney failure. - Continue Pomalyst  2 mg, three weeks on and one week off, starting the day of the next Darzalex  injection. - Schedule Darzalex  injection for December 1st, 2025. - Schedule follow-up appointment four weeks after the next  Darzalex  injection.  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 - Administered EPO injection for anemia today.         Visit Diagnosis 1. Multiple myeloma not having achieved remission (HCC)   2. Erythropoietin  (EPO) stimulating agent anemia management patient      Dr. Annah Skene, MD, MPH [facility] [phone] 02/14/2024 12:36 PM

## 2024-02-15 ENCOUNTER — Other Ambulatory Visit: Payer: Self-pay

## 2024-02-15 LAB — KAPPA/LAMBDA LIGHT CHAINS
Kappa free light chain: 4.2 mg/L (ref 3.3–19.4)
Kappa, lambda light chain ratio: 0.02 — ABNORMAL LOW (ref 0.26–1.65)
Lambda free light chains: 220.4 mg/L — ABNORMAL HIGH (ref 5.7–26.3)

## 2024-02-16 LAB — MULTIPLE MYELOMA PANEL, SERUM
Albumin SerPl Elph-Mcnc: 3.1 g/dL (ref 2.9–4.4)
Albumin/Glob SerPl: 0.6 — ABNORMAL LOW (ref 0.7–1.7)
Alpha 1: 0.3 g/dL (ref 0.0–0.4)
Alpha2 Glob SerPl Elph-Mcnc: 0.7 g/dL (ref 0.4–1.0)
B-Globulin SerPl Elph-Mcnc: 0.7 g/dL (ref 0.7–1.3)
Gamma Glob SerPl Elph-Mcnc: 3.9 g/dL — ABNORMAL HIGH (ref 0.4–1.8)
Globulin, Total: 5.7 g/dL — ABNORMAL HIGH (ref 2.2–3.9)
IgA: 14 mg/dL — ABNORMAL LOW (ref 61–437)
IgG (Immunoglobin G), Serum: 5675 mg/dL — ABNORMAL HIGH (ref 603–1613)
IgM (Immunoglobulin M), Srm: <5 mg/dL — ABNORMAL LOW (ref 20–172)
M Protein SerPl Elph-Mcnc: 3.8 g/dL — ABNORMAL HIGH
Total Protein ELP: 8.8 g/dL — ABNORMAL HIGH (ref 6.0–8.5)

## 2024-02-25 ENCOUNTER — Other Ambulatory Visit: Payer: Self-pay

## 2024-02-25 ENCOUNTER — Emergency Department

## 2024-02-25 ENCOUNTER — Emergency Department
Admission: EM | Admit: 2024-02-25 | Discharge: 2024-02-25 | Disposition: A | Discharge status: Home / Self Care | Attending: Emergency Medicine | Admitting: Emergency Medicine

## 2024-02-25 DIAGNOSIS — I82611 Acute embolism and thrombosis of superficial veins of right upper extremity: Secondary | ICD-10-CM | POA: Diagnosis not present

## 2024-02-25 DIAGNOSIS — E119 Type 2 diabetes mellitus without complications: Secondary | ICD-10-CM | POA: Insufficient documentation

## 2024-02-25 DIAGNOSIS — R2232 Localized swelling, mass and lump, left upper limb: Secondary | ICD-10-CM | POA: Diagnosis present

## 2024-02-25 DIAGNOSIS — I82612 Acute embolism and thrombosis of superficial veins of left upper extremity: Secondary | ICD-10-CM

## 2024-02-25 LAB — BASIC METABOLIC PANEL WITH GFR
Anion gap: 7 (ref 5–15)
BUN: 13 mg/dL (ref 8–23)
CO2: 27 mmol/L (ref 22–32)
Calcium: 8 mg/dL — ABNORMAL LOW (ref 8.9–10.3)
Chloride: 105 mmol/L (ref 98–111)
Creatinine, Ser: 0.89 mg/dL (ref 0.61–1.24)
GFR, Estimated: >60 mL/min (ref >60)
Glucose, Bld: 150 mg/dL — ABNORMAL HIGH (ref 70–99)
Potassium: 3.6 mmol/L (ref 3.5–5.1)
Sodium: 139 mmol/L (ref 135–145)

## 2024-02-25 LAB — CBC WITH DIFFERENTIAL/PLATELET
Abs Immature Granulocytes: 0.01 K/uL (ref 0.00–0.07)
Basophils Absolute: 0 K/uL (ref 0.0–0.1)
Basophils Relative: 0 %
Eosinophils Absolute: 0 K/uL (ref 0.0–0.5)
Eosinophils Relative: 1 %
HCT: 27.9 % — ABNORMAL LOW (ref 39.0–52.0)
Hemoglobin: 8.6 g/dL — ABNORMAL LOW (ref 13.0–17.0)
Immature Granulocytes: 0 %
Lymphocytes Relative: 27 %
Lymphs Abs: 0.8 K/uL (ref 0.7–4.0)
MCH: 31.3 pg (ref 26.0–34.0)
MCHC: 30.8 g/dL (ref 30.0–36.0)
MCV: 101.5 fL — ABNORMAL HIGH (ref 80.0–100.0)
Monocytes Absolute: 0.2 K/uL (ref 0.1–1.0)
Monocytes Relative: 5 %
Neutro Abs: 2 K/uL (ref 1.7–7.7)
Neutrophils Relative %: 67 %
Platelets: 233 K/uL (ref 150–400)
RBC: 2.75 MIL/uL — ABNORMAL LOW (ref 4.22–5.81)
RDW: 16.5 % — ABNORMAL HIGH (ref 11.5–15.5)
WBC: 3 K/uL — ABNORMAL LOW (ref 4.0–10.5)
nRBC: 0 % (ref 0.0–0.2)

## 2024-02-25 NOTE — ED Provider Notes (Signed)
 [facility] Provider Note    Event Date/Time   First MD Initiated Contact with Patient 02/25/24 1608     (approximate)   History   Left arm swelling   HPI  CHETT Weiss is a 73 y.o. male who presents to the emergency department today at the advice of his doctor because of left arm swelling.  History primarily obtained from EMS report.  Patient had broken his a left arm a number of months ago but has just now started having swelling.  Apparently his doctor some was worried about a blood clot.  The patient himself states that he has noticed some swelling for the past month.  He has some discomfort with the swelling.  Additionally patient was found to be hyperglycemic by EMS.     Physical Exam   Triage Vital Signs: ED Triage Vitals  Encounter Vitals Group     BP 02/25/24 1611 129/87     Girls Systolic BP Percentile --      Girls Diastolic BP Percentile --      Boys Systolic BP Percentile --      Boys Diastolic BP Percentile --      Pulse Rate 02/25/24 1611 68     Resp 02/25/24 1611 15     Temp 02/25/24 1611 (!) 97.5 F (36.4 C)     Temp Source 02/25/24 1611 Oral     SpO2 02/25/24 1611 100 %     Weight 02/25/24 1612 160 lb (72.6 kg)     Height 02/25/24 1612 5' 10 (1.778 m)     Head Circumference --      Peak Flow --      Pain Score 02/25/24 1608 0     Pain Loc --      Pain Education --      Exclude from Growth Chart --     Most recent vital signs: Vitals:   02/25/24 1611  BP: 129/87  Pulse: 68  Resp: 15  Temp: (!) 97.5 F (36.4 C)  SpO2: 100%   General: Awake, alert. CV:  Good peripheral perfusion. Regular rate and rhythm. Resp:  Normal effort. Lungs clear. Abd:  No distention.  Other:  Left arm with swelling from mid upper arm distally. No erythema. No warmth. No significant tenderness.   ED Results / Procedures / Treatments   Labs (all labs ordered are listed, but only abnormal results are displayed) Labs Reviewed  CBC  WITH DIFFERENTIAL/PLATELET - Abnormal; Notable for the following components:      Result Value   WBC 3.0 (*)    RBC 2.75 (*)    Hemoglobin 8.6 (*)    HCT 27.9 (*)    MCV 101.5 (*)    RDW 16.5 (*)    All other components within normal limits  BASIC METABOLIC PANEL WITH GFR - Abnormal; Notable for the following components:   Glucose, Bld 150 (*)    Calcium  8.0 (*)    All other components within normal limits     EKG  None   RADIOLOGY US  left upper extremity Radiology interpretation:  IMPRESSION:  Occlusive superficial thrombus within the LEFT cephalic vein and  limited evaluation of the LEFT basilic vein, without evidence of DVT  within the LEFT upper extremity.   I independently interpreted and visualized the left humerus. My interpretation: No acute fracture Radiology interpretation:  IMPRESSION:  1. Healing changes about the proximal left humerus fracture.  2. No evidence of new fracture.  PROCEDURES:  Critical Care performed: No    MEDICATIONS ORDERED IN ED: Medications - No data to display   IMPRESSION / MDM / ASSESSMENT AND PLAN / ED COURSE  I reviewed the triage vital signs and the nursing notes.                              Differential diagnosis includes, but is not limited to, DVT, cellulitis, fracture  Patient's presentation is most consistent with acute presentation with potential threat to life or bodily function.   Patient presented to the emergency department today because concerns for left arm swelling.  On exam there is no erythema.  Slight warmth.  Ultrasound was performed which is consistent with superficial thrombus.  It x-ray without any concerning acute fracture.  I discussed superficial thrombus with patient and family at bedside.  Discussed typical treatment is not with blood thinners.  At this time they would not be interested in blood thinners given patient's history of falls.  I discussed warm compress and elevation.  Did  encourage follow-up with primary care.      FINAL CLINICAL IMPRESSION(S) / ED DIAGNOSES   Final diagnoses:  Superficial venous thrombosis of left upper extremity      Note:  This document was prepared using Dragon voice recognition software and may include unintentional dictation errors.    Floy Roberts, MD 02/25/24 2056

## 2024-02-25 NOTE — ED Triage Notes (Addendum)
 Pt came in via EMS from home due to bilateral swollen hands, pt states he broke his left arm a couple of months ago and PCP wanting him to get checked out. Pt not in distress at this time. Pt has history of diabetes, CBG of 353 with EMS.

## 2024-02-29 ENCOUNTER — Inpatient Hospital Stay: Attending: Oncology

## 2024-02-29 ENCOUNTER — Inpatient Hospital Stay (HOSPITAL_BASED_OUTPATIENT_CLINIC_OR_DEPARTMENT_OTHER): Admitting: Oncology

## 2024-02-29 ENCOUNTER — Inpatient Hospital Stay

## 2024-02-29 ENCOUNTER — Encounter: Payer: Self-pay | Admitting: Oncology

## 2024-02-29 VITALS — BP 137/71 | HR 66

## 2024-02-29 VITALS — BP 87/56 | HR 73 | Temp 95.5°F | Resp 18 | Ht 70.0 in | Wt 160.0 lb

## 2024-02-29 DIAGNOSIS — C9002 Multiple myeloma in relapse: Secondary | ICD-10-CM | POA: Diagnosis not present

## 2024-02-29 DIAGNOSIS — C9000 Multiple myeloma not having achieved remission: Secondary | ICD-10-CM | POA: Insufficient documentation

## 2024-02-29 DIAGNOSIS — N1831 Chronic kidney disease, stage 3a: Secondary | ICD-10-CM | POA: Insufficient documentation

## 2024-02-29 DIAGNOSIS — D638 Anemia in other chronic diseases classified elsewhere: Secondary | ICD-10-CM

## 2024-02-29 DIAGNOSIS — Z79899 Other long term (current) drug therapy: Secondary | ICD-10-CM

## 2024-02-29 DIAGNOSIS — Z5111 Encounter for antineoplastic chemotherapy: Secondary | ICD-10-CM

## 2024-02-29 DIAGNOSIS — Z5112 Encounter for antineoplastic immunotherapy: Secondary | ICD-10-CM | POA: Insufficient documentation

## 2024-02-29 DIAGNOSIS — I129 Hypertensive chronic kidney disease with stage 1 through stage 4 chronic kidney disease, or unspecified chronic kidney disease: Secondary | ICD-10-CM | POA: Diagnosis present

## 2024-02-29 DIAGNOSIS — D631 Anemia in chronic kidney disease: Secondary | ICD-10-CM | POA: Insufficient documentation

## 2024-02-29 LAB — CBC WITH DIFFERENTIAL/PLATELET
Abs Immature Granulocytes: 0.01 K/uL (ref 0.00–0.07)
Basophils Absolute: 0 K/uL (ref 0.0–0.1)
Basophils Relative: 0 %
Eosinophils Absolute: 0.1 K/uL (ref 0.0–0.5)
Eosinophils Relative: 2 %
HCT: 23.2 % — ABNORMAL LOW (ref 39.0–52.0)
Hemoglobin: 7.2 g/dL — ABNORMAL LOW (ref 13.0–17.0)
Immature Granulocytes: 0 %
Lymphocytes Relative: 34 %
Lymphs Abs: 1.1 K/uL (ref 0.7–4.0)
MCH: 31 pg (ref 26.0–34.0)
MCHC: 31 g/dL (ref 30.0–36.0)
MCV: 100 fL (ref 80.0–100.0)
Monocytes Absolute: 0.3 K/uL (ref 0.1–1.0)
Monocytes Relative: 8 %
Neutro Abs: 1.8 K/uL (ref 1.7–7.7)
Neutrophils Relative %: 56 %
Platelets: 235 K/uL (ref 150–400)
RBC: 2.32 MIL/uL — ABNORMAL LOW (ref 4.22–5.81)
RDW: 16.2 % — ABNORMAL HIGH (ref 11.5–15.5)
WBC: 3.3 K/uL — ABNORMAL LOW (ref 4.0–10.5)
nRBC: 0 % (ref 0.0–0.2)

## 2024-02-29 LAB — COMPREHENSIVE METABOLIC PANEL WITH GFR
ALT: 26 U/L (ref 0–44)
AST: 28 U/L (ref 15–41)
Albumin: 2.1 g/dL — ABNORMAL LOW (ref 3.5–5.0)
Alkaline Phosphatase: 69 U/L (ref 38–126)
Anion gap: 7 (ref 5–15)
BUN: 17 mg/dL (ref 8–23)
CO2: 24 mmol/L (ref 22–32)
Calcium: 7.6 mg/dL — ABNORMAL LOW (ref 8.9–10.3)
Chloride: 105 mmol/L (ref 98–111)
Creatinine, Ser: 1.13 mg/dL (ref 0.61–1.24)
GFR, Estimated: >60 mL/min (ref >60)
Glucose, Bld: 166 mg/dL — ABNORMAL HIGH (ref 70–99)
Potassium: 3.7 mmol/L (ref 3.5–5.1)
Sodium: 136 mmol/L (ref 135–145)
Total Bilirubin: 0.4 mg/dL (ref 0.0–1.2)
Total Protein: 7.4 g/dL (ref 6.5–8.1)

## 2024-02-29 MED ORDER — DARATUMUMAB-HYALURONIDASE-FIHJ 1800-30000 MG-UT/15ML ~~LOC~~ SOLN
1800.0000 mg | Freq: Once | SUBCUTANEOUS | Status: AC
  Administered 2024-02-29: 1800 mg via SUBCUTANEOUS
  Filled 2024-02-29: qty 15

## 2024-02-29 MED ORDER — ACETAMINOPHEN 325 MG PO TABS
650.0000 mg | ORAL_TABLET | Freq: Once | ORAL | Status: AC
  Administered 2024-02-29: 650 mg via ORAL
  Filled 2024-02-29: qty 2

## 2024-02-29 MED ORDER — SODIUM CHLORIDE 0.9 % IV SOLN
Freq: Once | INTRAVENOUS | Status: AC
  Filled 2024-02-29: qty 250

## 2024-02-29 MED ORDER — DEXAMETHASONE 4 MG PO TABS
10.0000 mg | ORAL_TABLET | Freq: Once | ORAL | Status: AC
  Administered 2024-02-29: 10 mg via ORAL
  Filled 2024-02-29: qty 3

## 2024-02-29 NOTE — Patient Instructions (Signed)
 [facility] - A DEPT OF [facility]  Discharge Instructions: Thank you for choosing [facility] to provide your oncology and hematology care.  If you have a lab appointment with the Cancer Center, please go directly to the Cancer Center and check in at the registration area.  Wear comfortable clothing and clothing appropriate for easy access to any Portacath or PICC line.   We strive to give you quality time with your provider. You may need to reschedule your appointment if you arrive late (15 or more minutes).  Arriving late affects you and other patients whose appointments are after yours.  Also, if you miss three or more appointments without notifying the office, you may be dismissed from the clinic at the provider's discretion.      For prescription refill requests, have your pharmacy contact our office and allow 72 hours for refills to be completed.     To help prevent nausea and vomiting after your treatment, we encourage you to take your nausea medication as directed.  BELOW ARE SYMPTOMS THAT SHOULD BE REPORTED IMMEDIATELY: *FEVER GREATER THAN 100.4 F (38 C) OR HIGHER *CHILLS OR SWEATING *NAUSEA AND VOMITING THAT IS NOT CONTROLLED WITH YOUR NAUSEA MEDICATION *UNUSUAL SHORTNESS OF BREATH *UNUSUAL BRUISING OR BLEEDING *URINARY PROBLEMS (pain or burning when urinating, or frequent urination) *BOWEL PROBLEMS (unusual diarrhea, constipation, pain near the anus) TENDERNESS IN MOUTH AND THROAT WITH OR WITHOUT PRESENCE OF ULCERS (sore throat, sores in mouth, or a toothache) UNUSUAL RASH, SWELLING OR PAIN   Items with * indicate a potential emergency and should be followed up as soon as possible or go to the Emergency Department if any problems should occur.  Please show the CHEMOTHERAPY ALERT CARD or IMMUNOTHERAPY ALERT CARD at check-in to the Emergency Department and triage nurse.  Should you have questions after your visit or need to  cancel or reschedule your appointment, please contact [facility] - A DEPT OF [facility]  [phone] and follow the prompts.  Office hours are 8:00 a.m. to 4:30 p.m. Monday - Friday. Please note that voicemails left after 4:00 p.m. may not be returned until the following business day.  We are closed weekends and major holidays. You have access to a nurse at all times for urgent questions. Please call the main number to the clinic [phone] and follow the prompts.  For any non-urgent questions, you may also contact your provider using MyChart. We now offer e-Visits for anyone 64 and older to request care online for non-urgent symptoms. For details visit [email or web address].   Also download the MyChart app! Go to the app store, search "MyChart", open the app, select [facility], and log in with your MyChart username and password.

## 2024-02-29 NOTE — Progress Notes (Signed)
 Patient had a recent DVT on 11/28. He is unable to use left extremities & he is also back home from rehab.

## 2024-02-29 NOTE — Progress Notes (Signed)
 "    Hematology/Oncology Consult note [facility]  [phone] [phone]  Patient Care Team: Alla Amis, MD as PCP - General (Family Medicine) Pa, [facility] (Optometry) Melanee Annah BROCKS, MD as Consulting Physician (Oncology)   Name of the patient: Nicholas Weiss  [id number]  02-12-1951   Date of visit: 02/29/24  Diagnosis- 1. high risk IgG lambda multiple myeloma R-ISS stage III  2. Anemia of chronic kidney disease  Chief complaint/ Reason for visit-routine follow-up of multiple myeloma  Heme/Onc history:  Patient is a 73 year old African-American male with a past medical history significant for uncontrolled type 1 diabetes, chronic kidney disease stage III chronic normocytic anemia referred for abnormal SPEP. Patient's creatinine has been fluctuating between 1.5-2 but about 5 months ago it went up all the way to 5 and his blood sugars were in the 1000 range. More recently his kidney numbers have been drifting back to normal values. As a part of the work-up he had serum protein electrophoresis done which showed an elevated gammaglobulin fraction with an M spike of 3%. The amount of M spike was not quantified in the specimen. He has therefore been referred to us  for further management. Patient endorses chronic fatigue reports that his appetite and weight have remained stable   Results of blood work from 04/08/2020 were as follows: CBC showed white count of 2.9, H&H of 10.5/31.2 with an MCV of 91 and a platelet count of 191.  CMP showed a mildly elevated creatinine of 1.2 which was better as compared to 5 months ago when it was 3.9.  Total protein was mildly elevated at 9.1 calcium  normal at 8.4 ferritin and iron  studies B12 folate TSH and haptoglobin were normal.  Myeloma panel revealed an elevated IgG level of 06/04/2004 with an M protein of 3.1 g.  Immunofixation showed IgG lambda specificity.  Serum free light chain ratio was elevated at  25 and free light chain lambda elevated at 370   Further myeloma work-up including a PET CT scan did not reveal any evidence of edematous lesions.  Bone marrow biopsy showed 23% plasma cells by manual count and 30% by CD138 IHC.  Normal cytogenetics.  FISH studies for myeloma showed gain of 1 q.  13 q-. detected.  P53 not detected.   He was treated for a year and has having smoldering multiple myeloma since both CKD and anemia have been stable for 3 to 4 years and could be secondary to uncontrolled diabetes.In June 2023 IgG levels increased to 5463 with an M protein of 4.1 g as compared to 2.6 g in September 2022.  Serum free light chain ratio remains around 25.  Therefore a repeat bone marrow biopsy was done which shows further increase in plasma cells ranging from 30 to 70% and by immunohistochemistry 60 to 70% of the cells were positive for CD138.  Cytogenetics normal.  FISH study showed 4: 14 translocation and gain of 1 q. making this high risk RISS stage III.  Repeat PET scan showed no lytic lesions   Patient is not a transplant candidate and was started on Velcade  Revlimid  dexamethasone  regimen in July 2023.  He was also referred to [facility] for second opinion but did not go.  Treatment complicated by repeated hospitalizations for uncontrolled blood sugars.  He has been frequently hospitalized thereby causing multiple interruptions in his treatment. Due to lack of adequate response to Vrd darzalex  was added in may 2024. M protein remained around 2 g despite  adding Darzalex  to the regimen and therefore plan to switch to Pomalyst  plus Darzalex  and stop Velcade  and Revlimid  altogether starting August 2024  Interval history-he continues to do poorly at home.  He spends most of his time in bed and is able to use his wheelchair to get across the room with his legs.  He had a prior left upper Extremity humerus fracture and has been unable to move his left arm for the last 4 to 5 months.  Mental status waxes and  wanes in at times he is groggy and slow to respond  ECOG PS- 3 Pain scale- 0   Review of systems- Review of Systems  Constitutional:  Positive for malaise/fatigue. Negative for chills, fever and weight loss.  HENT:  Negative for congestion, ear discharge and nosebleeds.   Eyes:  Negative for blurred vision.  Respiratory:  Negative for cough, hemoptysis, sputum production, shortness of breath and wheezing.   Cardiovascular:  Negative for chest pain, palpitations, orthopnea and claudication.  Gastrointestinal:  Negative for abdominal pain, blood in stool, constipation, diarrhea, heartburn, melena, nausea and vomiting.  Genitourinary:  Negative for dysuria, flank pain, frequency, hematuria and urgency.  Musculoskeletal:  Negative for back pain, joint pain and myalgias.       Upper extremity swelling  Skin:  Negative for rash.  Neurological:  Negative for dizziness, tingling, focal weakness, seizures, weakness and headaches.  Endo/Heme/Allergies:  Does not bruise/bleed easily.  Psychiatric/Behavioral:  Negative for depression and suicidal ideas. The patient does not have insomnia.       Allergies  Allergen Reactions   Penicillins Other (See Comments)    Childhood allergy -tolerated amoxil  03/2022 Unknown reaction Has patient had a PCN reaction causing immediate rash, facial/tongue/throat swelling, SOB or lightheadedness with hypotension: No Has patient had a PCN reaction causing severe rash involving mucus membranes or skin necrosis: No Has patient had a PCN reaction that required hospitalization No Has patient had a PCN reaction occurring within the last 10 years: No If all of the above answers are NO, then may proceed with Cephalosporin use.      Past Medical History:  Diagnosis Date   Acute metabolic encephalopathy 12/02/2020   DKA (diabetic ketoacidoses) 04/06/2016   Hypercholesteremia    Hypertension      Past Surgical History:  Procedure Laterality Date   AMPUTATION  TOE Left 04/25/2022   Procedure: LEFT GREAT TOE AMPUTATION AND LEFT PARTIAL 2ND TOE AMPUTATION;  Surgeon: Janit Thresa HERO, DPM;  Location: [facility] ORS;  Service: Podiatry;  Laterality: Left;   COLONOSCOPY WITH PROPOFOL  N/A 02/01/2020   Procedure: COLONOSCOPY WITH PROPOFOL ;  Surgeon: Unk Corinn Skiff, MD;  Location[facility] ENDOSCOPY;  Service: Gastroenterology;  Laterality: N/A;   ESOPHAGOGASTRODUODENOSCOPY  02/01/2020   Procedure: ESOPHAGOGASTRODUODENOSCOPY (EGD);  Surgeon: Unk Corinn Skiff, MD;  Location: [facility] ENDOSCOPY;  Service: Gastroenterology;;   ESOPHAGOGASTRODUODENOSCOPY (EGD) WITH PROPOFOL  N/A 06/07/2022   Procedure: ESOPHAGOGASTRODUODENOSCOPY (EGD) WITH PROPOFOL ;  Surgeon: Saintclair Jasper, MD;  Location: [facility] ENDOSCOPY;  Service: Gastroenterology;  Laterality: N/A;   KNEE SURGERY Right    Torn meniscus   KNEE SURGERY Left     Social History   Socioeconomic History   Marital status: Single    Spouse name: Not on file   Number of children: 3   Years of education: Not on file   Highest education level: High school graduate  Occupational History    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  Tobacco Use   Smoking status: Never   Smokeless tobacco:  Never  Vaping Use   Vaping status: Never Used  Substance and Sexual Activity   Alcohol use: Not Currently    Alcohol/week: 0.0 - 1.0 standard drinks of alcohol    Comment: once every 2 months   Drug use: Yes    Types: Marijuana, Crack cocaine    Comment: last week   Sexual activity: Not Currently    Birth control/protection: None  Other Topics Concern   Not on file  Social History Narrative   Lives with girlfriend sharon   Social Drivers of Health   Financial Resource Strain: Low Risk  (10/20/2023)   Received from [facility]   Overall Financial Resource Strain (CARDIA)    Difficulty of Paying Living Expenses: Not hard at all  Food Insecurity: Patient Unable To Answer (12/26/2023)   Hunger Vital Sign    Worried About  Running Out of Food in the Last Year: Patient unable to answer    Ran Out of Food in the Last Year: Patient unable to answer  Transportation Needs: No Transportation Needs (11/16/2023)   PRAPARE - Administrator, Civil Service (Medical): No    Lack of Transportation (Non-Medical): No  Physical Activity: Insufficiently Active (01/01/2020)   Exercise Vital Sign    Days of Exercise per Week: 7 days    Minutes of Exercise per Session: 20 min  Stress: No Stress Concern Present (01/01/2020)   [employer] of Occupational Health - Occupational Stress Questionnaire    Feeling of Stress : Not at all  Social Connections: Moderately Isolated (11/16/2023)   Social Connection and Isolation Panel    Frequency of Communication with Friends and Family: More than three times a week    Frequency of Social Gatherings with Friends and Family: More than three times a week    Attends Religious Services: Never    Database Administrator or Organizations: No    Attends Banker Meetings: Never    Marital Status: Living with partner  Intimate Partner Violence: Not At Risk (12/26/2023)   Humiliation, Afraid, Rape, and Kick questionnaire    Fear of Current or Ex-Partner: No    Emotionally Abused: No    Physically Abused: No    Sexually Abused: No    Family History  Problem Relation Age of Onset   Heart attack Father    Hypertension Sister    Cancer Sister      Current Outpatient Medications:    acetaminophen  (TYLENOL ) 325 MG tablet, Take 2 tablets (650 mg total) by mouth every 6 (six) hours as needed for mild pain (pain score 1-3) or fever., Disp: , Rfl:    acyclovir  (ZOVIRAX ) 400 MG tablet, Take 400 mg by mouth 2 (two) times daily., Disp: , Rfl:    atorvastatin  (LIPITOR ) 80 MG tablet, Take 1 tablet (80 mg total) by mouth at bedtime. Hold while taking Paxlovid , Disp: 30 tablet, Rfl: 2   Continuous Glucose Sensor (DEXCOM G7 SENSOR) MISC, SMARTSIG:Every 10 Days, Disp: , Rfl:    Fe  Fum-Vit C-Vit B12-FA (TRIGELS-F FORTE) CAPS capsule, Take 1 capsule by mouth 2 (two) times daily., Disp: , Rfl:    gabapentin  (NEURONTIN ) 300 MG capsule, Take 1 capsule (300 mg total) by mouth 2 (two) times daily., Disp: 60 capsule, Rfl: 0   insulin  lispro (HUMALOG) 100 UNIT/ML injection, Inject 0-12 Units into the skin 4 (four) times daily -  with meals and at bedtime. Inject as per sliding scale:  If 150-200=  0 units 201-250=       3 units 251-300=       5 units 301-350=      7 units 351-400=      9 units For blood sugar >400, give 10 units and notify MD/NP (Patient taking differently: Inject 0-12 Units into the skin 4 (four) times daily -  with meals and at bedtime. Inject as per sliding scale:  If 150-200=    0 units 201-250=       3 units 251-300=       5 units 301-350=      7 units 351-400=      9 units For blood sugar >400, give 10 units and notify MD/NP), Disp: , Rfl:    latanoprost  (XALATAN ) 0.005 % ophthalmic solution, Place 1 drop into both eyes at bedtime., Disp: , Rfl:    levETIRAcetam  (KEPPRA ) 500 MG tablet, Take 1 tablet by mouth twice daily, Disp: 60 tablet, Rfl: 0   midodrine  (PROAMATINE ) 2.5 MG tablet, Take 1 tablet (2.5 mg total) by mouth 3 (three) times daily as needed (For systolic blood pressure less than 100)., Disp: , Rfl:    Multiple Vitamin (MULTIVITAMIN WITH MINERALS) TABS tablet, Take 1 tablet by mouth daily., Disp: , Rfl:    mupirocin  ointment (BACTROBAN ) 2 %, Apply topically., Disp: , Rfl:    polyethylene glycol (MIRALAX  / GLYCOLAX ) 17 g packet, Take 17 g by mouth daily as needed for mild constipation., Disp: 14 each, Rfl: 0   pomalidomide  (POMALYST ) 2 MG capsule, Take 1 capsule (2 mg total) by mouth daily. [employer] Auth [id number] Date Obtained 02/14/24, Disp: 21 capsule, Rfl: 0   docusate sodium  (COLACE) 100 MG capsule, Take 1 capsule (100 mg total) by mouth 2 (two) times daily., Disp: 10 capsule, Rfl: 0 No current facility-administered medications for this  visit.  Facility-Administered Medications Ordered in Other Visits:    epoetin  alfa-epbx (RETACRIT ) injection 40,000 Units, 40,000 Units, Subcutaneous, Q21 days, Melanee Annah BROCKS, MD, 40,000 Units at 07/09/23 1153  Physical exam:  Vitals:   02/29/24 0903  BP: (!) 87/56  Pulse: 73  Resp: 18  Temp: (!) 95.5 F (35.3 C)  TempSrc: Tympanic  SpO2: 100%  Weight: 160 lb (72.6 kg)  Height: 5' 10 (1.778 m)   Physical Exam Cardiovascular:     Rate and Rhythm: Normal rate and regular rhythm.     Heart sounds: Normal heart sounds.  Pulmonary:     Effort: Pulmonary effort is normal.     Breath sounds: Normal breath sounds.  Abdominal:     General: Bowel sounds are normal.     Palpations: Abdomen is soft.  Musculoskeletal:     Comments: Bilateral upper extremities appear swollen  Skin:    General: Skin is warm and dry.  Neurological:     Mental Status: He is alert.     Comments: Oriented to self and place      I have personally reviewed labs listed below:    Latest Ref Rng & Units 02/29/2024    8:29 AM  CMP  Glucose 70 - 99 mg/dL 833   BUN 8 - 23 mg/dL 17   Creatinine 9.38 - 1.24 mg/dL 8.86   Sodium 864 - 854 mmol/L 136   Potassium 3.5 - 5.1 mmol/L 3.7   Chloride 98 - 111 mmol/L 105   CO2 22 - 32 mmol/L 24   Calcium  8.9 - 10.3 mg/dL 7.6   Total Protein 6.5 - 8.1 g/dL 7.4  Total Bilirubin 0.0 - 1.2 mg/dL 0.4   Alkaline Phos 38 - 126 U/L 69   AST 15 - 41 U/L 28   ALT 0 - 44 U/L 26       Latest Ref Rng & Units 02/29/2024    8:29 AM  CBC  WBC 4.0 - 10.5 K/uL 3.3   Hemoglobin 13.0 - 17.0 g/dL 7.2   Hematocrit 60.9 - 52.0 % 23.2   Platelets 150 - 400 K/uL 235    I have personally reviewed Radiology images listed below: No images are attached to the encounter.  DG Humerus Left Result Date: 02/25/2024 EXAM: 1 VIEW(S) XRAY OF THE LEFT HUMERUS 02/25/2024 07:15:26 PM COMPARISON: Comparison with 11/23/2023. CLINICAL HISTORY: pain FINDINGS: BONES AND JOINTS: Sclerosis and  bony callus formation about the comminuted proximal humerus fracture compared with 11/23/2023. The fracture line remains visible. There is apex medial angulation. No evidence of new fracture. No focal osseous lesion. No joint dislocation. SOFT TISSUES: The soft tissues are unremarkable. IMPRESSION: 1. Healing changes about the proximal left humerus fracture. 2. No evidence of new fracture. Electronically signed by: Norman Gatlin MD 02/25/2024 07:20 PM EST RP Workstation: [id number]   US  Venous Img Upper Uni Left Result Date: 02/25/2024 CLINICAL DATA:  Left upper extremity swelling. EXAM: LEFT UPPER EXTREMITY VENOUS DOPPLER ULTRASOUND TECHNIQUE: Gray-scale sonography with graded compression, as well as color Doppler and duplex ultrasound were performed to evaluate the upper extremity deep venous system from the level of the subclavian vein and including the jugular, axillary, basilic, radial, ulnar and upper cephalic vein. Spectral Doppler was utilized to evaluate flow at rest and with distal augmentation maneuvers. COMPARISON:  March 20, 2020 FINDINGS: Contralateral Subclavian Vein: Respiratory phasicity is normal and symmetric with the symptomatic side. No evidence of thrombus. Normal compressibility. Internal Jugular Vein: No evidence of thrombus. Normal compressibility, respiratory phasicity and response to augmentation. Subclavian Vein: No evidence of thrombus. Normal compressibility, respiratory phasicity and response to augmentation. Axillary Vein: No evidence of thrombus. Normal compressibility, respiratory phasicity and response to augmentation. Cephalic Vein: There is occlusive thrombus seen within the LEFT cephalic vein with abnormal compressibility, respiratory phasicity and response to augmentation. Basilic Vein: The LEFT basilic vein is not clearly visualized secondary to soft tissue edema noted throughout the left arm and fluid around the left elbow. Brachial Veins: No evidence of thrombus.  Normal compressibility, respiratory phasicity and response to augmentation. Radial Veins: No evidence of thrombus. Normal compressibility, respiratory phasicity and response to augmentation. Ulnar Veins: No evidence of thrombus. Normal compressibility, respiratory phasicity and response to augmentation. Venous Reflux:  None visualized. Other Findings:  None visualized. IMPRESSION: Occlusive superficial thrombus within the LEFT cephalic vein and limited evaluation of the LEFT basilic vein, without evidence of DVT within the LEFT upper extremity. Electronically Signed   By: Suzen Dials M.D.   On: 02/25/2024 18:06     Assessment and plan- Patient is a 73 y.o. male with history of high risk IgG lambda multiple myeloma R-ISS stage III presently on Pomalyst  plus Darzalex  here for routine follow-up  Assessment and Plan    Multiple myeloma Managed with Pomalyst  and Darzalex  to control disease progression and prevent kidney failure. Bone marrow transplant not feasible due to health status. - Continue Pomalyst  three weeks on and one week off at 2 mg.  His M protein is gradually going up from 2.5 g in March 2025 presently 2 3.8 g.  Serum free light chain ratio has been waxing and waning over  the last 6 months without a clear rising trend.  Overall his performance status is poor and if there is continued progression on this regimen I may have to consider doing carfilzomib for him which would be a weekly regimen with potential cardiotoxicity. - Administered Darzalex  today.  Anemia secondary to multiple myeloma Anemia with hemoglobin level of 7.2, likely secondary to multiple myeloma. Monitoring required for transfusion need. - Scheduled follow-up in two weeks to assess blood work and determine need for blood transfusion. - Will administer EPO injection if needed in mid-December.  Superficial venous thrombosis of the right upper extremity Superficial venous thrombosis in the right upper extremity. No deep  vein thrombosis.  He will continue with low-dose aspirin  and does not have to be on Eliquis  due to prior history of intraventricular hemorrhage in August 2025 as well. - Apply warm compresses to the affected area. - Initiated baby aspirin  81 mg daily.  History of intracerebral hemorrhage with hemiplegia Intracerebral hemorrhage in August or September, contributing to hemiplegia. Blood thinners avoided due to hemorrhage risk.  Chronic left humerus fracture with functional impairment Chronic left humerus fracture with persistent swelling and functional impairment. Limited mobility and use of the left arm. - Continue current management and monitor for changes in function.      Overall patient's performance status is poor with waxing and waning mental status and poor mobility.  He has limited use of left upper extremity from his prior fracture.  Presently patient and his significant other wishes to continue with myeloma treatments   Visit Diagnosis 1. Anemia of chronic disease   2. Multiple myeloma not having achieved remission (HCC)   3. High risk medication use   4. Encounter for antineoplastic chemotherapy      Dr. Annah Skene, MD, MPH [facility] at [facility] [phone] 02/29/2024 12:57 PM                "

## 2024-03-01 LAB — KAPPA/LAMBDA LIGHT CHAINS
Kappa free light chain: 3.7 mg/L (ref 3.3–19.4)
Kappa, lambda light chain ratio: 0.02 — ABNORMAL LOW (ref 0.26–1.65)
Lambda free light chains: 205.3 mg/L — ABNORMAL HIGH (ref 5.7–26.3)

## 2024-03-06 ENCOUNTER — Other Ambulatory Visit: Payer: Self-pay | Admitting: Oncology

## 2024-03-06 ENCOUNTER — Encounter: Payer: Self-pay | Admitting: Oncology

## 2024-03-06 DIAGNOSIS — C9000 Multiple myeloma not having achieved remission: Secondary | ICD-10-CM

## 2024-03-07 LAB — MULTIPLE MYELOMA PANEL, SERUM
Albumin SerPl Elph-Mcnc: 2.5 g/dL — ABNORMAL LOW (ref 2.9–4.4)
Albumin/Glob SerPl: 0.5 — ABNORMAL LOW (ref 0.7–1.7)
Alpha 1: 0.2 g/dL (ref 0.0–0.4)
Alpha2 Glob SerPl Elph-Mcnc: 0.8 g/dL (ref 0.4–1.0)
B-Globulin SerPl Elph-Mcnc: 0.5 g/dL — ABNORMAL LOW (ref 0.7–1.3)
Gamma Glob SerPl Elph-Mcnc: 3.6 g/dL — ABNORMAL HIGH (ref 0.4–1.8)
Globulin, Total: 5.2 g/dL — ABNORMAL HIGH (ref 2.2–3.9)
IgA: 12 mg/dL — ABNORMAL LOW (ref 61–437)
IgG (Immunoglobin G), Serum: 4314 mg/dL — ABNORMAL HIGH (ref 603–1613)
IgM (Immunoglobulin M), Srm: <5 mg/dL — ABNORMAL LOW (ref 20–172)
M Protein SerPl Elph-Mcnc: 3.4 g/dL — ABNORMAL HIGH
Total Protein ELP: 7.7 g/dL (ref 6.0–8.5)

## 2024-03-08 ENCOUNTER — Other Ambulatory Visit: Payer: Self-pay

## 2024-03-08 ENCOUNTER — Encounter: Payer: Self-pay | Admitting: Oncology

## 2024-03-10 ENCOUNTER — Telehealth: Payer: Self-pay

## 2024-03-10 ENCOUNTER — Inpatient Hospital Stay

## 2024-03-10 DIAGNOSIS — D638 Anemia in other chronic diseases classified elsewhere: Secondary | ICD-10-CM

## 2024-03-10 DIAGNOSIS — Z5112 Encounter for antineoplastic immunotherapy: Secondary | ICD-10-CM | POA: Diagnosis not present

## 2024-03-10 DIAGNOSIS — C9000 Multiple myeloma not having achieved remission: Secondary | ICD-10-CM

## 2024-03-10 LAB — IRON AND TIBC
Iron: 66 ug/dL (ref 45–182)
Saturation Ratios: 37 % (ref 17.9–39.5)
TIBC: 179 ug/dL — ABNORMAL LOW (ref 250–450)
UIBC: 114 ug/dL

## 2024-03-10 LAB — CBC (CANCER CENTER ONLY)
HCT: 23.3 % — ABNORMAL LOW (ref 39.0–52.0)
Hemoglobin: 7.2 g/dL — ABNORMAL LOW (ref 13.0–17.0)
MCH: 31 pg (ref 26.0–34.0)
MCHC: 30.9 g/dL (ref 30.0–36.0)
MCV: 100.4 fL — ABNORMAL HIGH (ref 80.0–100.0)
Platelet Count: 220 K/uL (ref 150–400)
RBC: 2.32 MIL/uL — ABNORMAL LOW (ref 4.22–5.81)
RDW: 16.2 % — ABNORMAL HIGH (ref 11.5–15.5)
WBC Count: 4 K/uL (ref 4.0–10.5)
nRBC: 0 % (ref 0.0–0.2)

## 2024-03-10 LAB — SAMPLE TO BLOOD BANK

## 2024-03-10 LAB — VITAMIN B12: Vitamin B-12: 872 pg/mL (ref 180–914)

## 2024-03-10 LAB — FERRITIN: Ferritin: 491 ng/mL — ABNORMAL HIGH (ref 24–336)

## 2024-03-10 NOTE — Telephone Encounter (Signed)
 Hgb: 7.2 per Dr. Melanee no blood.  Detailed voice message left.

## 2024-03-13 ENCOUNTER — Inpatient Hospital Stay

## 2024-03-13 ENCOUNTER — Other Ambulatory Visit: Payer: Self-pay | Admitting: Internal Medicine

## 2024-03-14 ENCOUNTER — Other Ambulatory Visit: Payer: Self-pay | Admitting: Oncology

## 2024-03-14 LAB — MULTIPLE MYELOMA PANEL, SERUM
Albumin SerPl Elph-Mcnc: 2.9 g/dL (ref 2.9–4.4)
Albumin/Glob SerPl: 0.6 — ABNORMAL LOW (ref 0.7–1.7)
Alpha 1: 0.2 g/dL (ref 0.0–0.4)
Alpha2 Glob SerPl Elph-Mcnc: 0.8 g/dL (ref 0.4–1.0)
B-Globulin SerPl Elph-Mcnc: 0.8 g/dL (ref 0.7–1.3)
Gamma Glob SerPl Elph-Mcnc: 3.3 g/dL — ABNORMAL HIGH (ref 0.4–1.8)
Globulin, Total: 5.2 g/dL — ABNORMAL HIGH (ref 2.2–3.9)
IgA: 12 mg/dL — ABNORMAL LOW (ref 61–437)
IgG (Immunoglobin G), Serum: 4488 mg/dL — ABNORMAL HIGH (ref 603–1613)
IgM (Immunoglobulin M), Srm: <5 mg/dL — ABNORMAL LOW (ref 20–172)
M Protein SerPl Elph-Mcnc: 3 g/dL — ABNORMAL HIGH
Total Protein ELP: 8.1 g/dL (ref 6.0–8.5)

## 2024-03-15 LAB — KAPPA/LAMBDA LIGHT CHAINS
Kappa free light chain: 8.1 mg/L (ref 3.3–19.4)
Kappa, lambda light chain ratio: 0.03 — ABNORMAL LOW (ref 0.26–1.65)
Lambda free light chains: 286.7 mg/L — ABNORMAL HIGH (ref 5.7–26.3)

## 2024-03-16 ENCOUNTER — Other Ambulatory Visit: Payer: Self-pay

## 2024-03-20 ENCOUNTER — Encounter: Attending: Physician Assistant | Admitting: Physician Assistant

## 2024-03-20 DIAGNOSIS — M6281 Muscle weakness (generalized): Secondary | ICD-10-CM | POA: Insufficient documentation

## 2024-03-20 DIAGNOSIS — E1022 Type 1 diabetes mellitus with diabetic chronic kidney disease: Secondary | ICD-10-CM | POA: Insufficient documentation

## 2024-03-20 DIAGNOSIS — Z794 Long term (current) use of insulin: Secondary | ICD-10-CM | POA: Insufficient documentation

## 2024-03-20 DIAGNOSIS — I618 Other nontraumatic intracerebral hemorrhage: Secondary | ICD-10-CM | POA: Insufficient documentation

## 2024-03-20 DIAGNOSIS — L89612 Pressure ulcer of right heel, stage 2: Secondary | ICD-10-CM | POA: Diagnosis present

## 2024-03-20 DIAGNOSIS — E10621 Type 1 diabetes mellitus with foot ulcer: Secondary | ICD-10-CM | POA: Insufficient documentation

## 2024-03-20 DIAGNOSIS — N183 Chronic kidney disease, stage 3 unspecified: Secondary | ICD-10-CM | POA: Diagnosis not present

## 2024-03-21 ENCOUNTER — Other Ambulatory Visit: Payer: Self-pay

## 2024-03-26 ENCOUNTER — Encounter: Payer: Self-pay | Admitting: Oncology

## 2024-03-28 ENCOUNTER — Encounter: Payer: Self-pay | Admitting: Oncology

## 2024-03-28 ENCOUNTER — Inpatient Hospital Stay

## 2024-03-28 ENCOUNTER — Inpatient Hospital Stay: Admitting: Oncology

## 2024-03-28 VITALS — BP 119/71 | HR 67 | Temp 98.6°F | Resp 20 | Wt 152.3 lb

## 2024-03-28 DIAGNOSIS — C9000 Multiple myeloma not having achieved remission: Secondary | ICD-10-CM

## 2024-03-28 DIAGNOSIS — T451X5A Adverse effect of antineoplastic and immunosuppressive drugs, initial encounter: Secondary | ICD-10-CM

## 2024-03-28 DIAGNOSIS — D649 Anemia, unspecified: Secondary | ICD-10-CM

## 2024-03-28 DIAGNOSIS — D638 Anemia in other chronic diseases classified elsewhere: Secondary | ICD-10-CM

## 2024-03-28 DIAGNOSIS — D701 Agranulocytosis secondary to cancer chemotherapy: Secondary | ICD-10-CM | POA: Diagnosis not present

## 2024-03-28 DIAGNOSIS — Z5112 Encounter for antineoplastic immunotherapy: Secondary | ICD-10-CM | POA: Diagnosis not present

## 2024-03-28 DIAGNOSIS — Z79899 Other long term (current) drug therapy: Secondary | ICD-10-CM

## 2024-03-28 LAB — COMPREHENSIVE METABOLIC PANEL WITH GFR
ALT: 20 U/L (ref 0–44)
AST: 17 U/L (ref 15–41)
Albumin: 3.2 g/dL — ABNORMAL LOW (ref 3.5–5.0)
Alkaline Phosphatase: 88 U/L (ref 38–126)
Anion gap: 9 (ref 5–15)
BUN: 19 mg/dL (ref 8–23)
CO2: 28 mmol/L (ref 22–32)
Calcium: 8.4 mg/dL — ABNORMAL LOW (ref 8.9–10.3)
Chloride: 103 mmol/L (ref 98–111)
Creatinine, Ser: 0.96 mg/dL (ref 0.61–1.24)
GFR, Estimated: >60 mL/min
Glucose, Bld: 165 mg/dL — ABNORMAL HIGH (ref 70–99)
Potassium: 4.1 mmol/L (ref 3.5–5.1)
Sodium: 139 mmol/L (ref 135–145)
Total Bilirubin: 0.4 mg/dL (ref 0.0–1.2)
Total Protein: 7.8 g/dL (ref 6.5–8.1)

## 2024-03-28 LAB — CBC WITH DIFFERENTIAL/PLATELET
Abs Immature Granulocytes: 0.02 K/uL (ref 0.00–0.07)
Basophils Absolute: 0 K/uL (ref 0.0–0.1)
Basophils Relative: 2 %
Eosinophils Absolute: 0.1 K/uL (ref 0.0–0.5)
Eosinophils Relative: 4 %
HCT: 26 % — ABNORMAL LOW (ref 39.0–52.0)
Hemoglobin: 8.1 g/dL — ABNORMAL LOW (ref 13.0–17.0)
Immature Granulocytes: 1 %
Lymphocytes Relative: 40 %
Lymphs Abs: 1 K/uL (ref 0.7–4.0)
MCH: 31.2 pg (ref 26.0–34.0)
MCHC: 31.2 g/dL (ref 30.0–36.0)
MCV: 100 fL (ref 80.0–100.0)
Monocytes Absolute: 0.3 K/uL (ref 0.1–1.0)
Monocytes Relative: 14 %
Neutro Abs: 1 K/uL — ABNORMAL LOW (ref 1.7–7.7)
Neutrophils Relative %: 39 %
Platelets: 252 K/uL (ref 150–400)
RBC: 2.6 MIL/uL — ABNORMAL LOW (ref 4.22–5.81)
RDW: 16.3 % — ABNORMAL HIGH (ref 11.5–15.5)
WBC: 2.4 K/uL — ABNORMAL LOW (ref 4.0–10.5)
nRBC: 0.8 % — ABNORMAL HIGH (ref 0.0–0.2)

## 2024-03-28 MED ORDER — EPOETIN ALFA-EPBX 40000 UNIT/ML IJ SOLN
40000.0000 [IU] | INTRAMUSCULAR | Status: DC
  Administered 2024-03-28: 40000 [IU] via SUBCUTANEOUS

## 2024-03-28 NOTE — Progress Notes (Signed)
 "    Hematology/Oncology Consult note [facility]  Telephone:([phone] [phone]  Patient Care Team: Alla Amis, MD as PCP - General (Family Medicine) Pa, [facility] (Optometry) Melanee Annah BROCKS, MD as Consulting Physician (Oncology)   Name of the patient: Nicholas Weiss  [id number]  [birth year]   Date of visit: 03/28/2024  Diagnosis-  1. high risk IgG lambda multiple myeloma R-ISS stage III  2. Anemia of chronic kidney disease  Chief complaint/ Reason for visit- routine f/u of multiple myeloma  Heme/Onc history: Patient is a 73 year old African-American male with a past medical history significant for uncontrolled type 1 diabetes, chronic kidney disease stage III chronic normocytic anemia referred for abnormal SPEP. Patient's creatinine has been fluctuating between 1.5-2 but about 5 months ago it went up all the way to 5 and his blood sugars were in the 1000 range. More recently his kidney numbers have been drifting back to normal values. As a part of the work-up he had serum protein electrophoresis done which showed an elevated gammaglobulin fraction with an M spike of 3%. The amount of M spike was not quantified in the specimen. He has therefore been referred to us  for further management. Patient endorses chronic fatigue reports that his appetite and weight have remained stable   Results of blood work from 04/08/2020 were as follows: CBC showed white count of 2.9, H&H of 10.5/31.2 with an MCV of 91 and a platelet count of 191.  CMP showed a mildly elevated creatinine of 1.2 which was better as compared to 5 months ago when it was 3.9.  Total protein was mildly elevated at 9.1 calcium  normal at 8.4 ferritin and iron  studies B12 folate TSH and haptoglobin were normal.  Myeloma panel revealed an elevated IgG level of 06/04/2004 with an M protein of 3.1 g.  Immunofixation showed IgG lambda specificity.  Serum free light chain ratio was elevated at 25  and free light chain lambda elevated at 370   Further myeloma work-up including a PET CT scan did not reveal any evidence of edematous lesions.  Bone marrow biopsy showed 23% plasma cells by manual count and 30% by CD138 IHC.  Normal cytogenetics.  FISH studies for myeloma showed gain of 1 q.  13 q-. detected.  P53 not detected.   He was treated for a year and has having smoldering multiple myeloma since both CKD and anemia have been stable for 3 to 4 years and could be secondary to uncontrolled diabetes.In June 2023 IgG levels increased to 5463 with an M protein of 4.1 g as compared to 2.6 g in September 2022.  Serum free light chain ratio remains around 25.  Therefore a repeat bone marrow biopsy was done which shows further increase in plasma cells ranging from 30 to 70% and by immunohistochemistry 60 to 70% of the cells were positive for CD138.  Cytogenetics normal.  FISH study showed 4: 14 translocation and gain of 1 q. making this high risk RISS stage III.  Repeat PET scan showed no lytic lesions   Patient is not a transplant candidate and was started on Velcade  Revlimid  dexamethasone  regimen in July 2023.  He was also referred to [facility] for second opinion but did not go.  Treatment complicated by repeated hospitalizations for uncontrolled blood sugars.  He has been frequently hospitalized thereby causing multiple interruptions in his treatment. Due to lack of adequate response to Vrd darzalex  was added in may 2024. M protein remained around 2 g  despite adding Darzalex  to the regimen and therefore plan to switch to Pomalyst  plus Darzalex  and stop Velcade  and Revlimid  altogether starting August 2024    Interval history- Nicholas Weiss is a 73 year old male with relapsed/refractory multiple myeloma and chemotherapy-induced anemia who presents for follow-up and assessment of treatment response.  He remains on Darzalex  and Pomalyst  for multiple myeloma, with the last dose administered four weeks  ago. Over the past six to seven months, laboratory markers have shown a gradual upward trend, though not consistently. He restarted Pomalyst  earlier this week.  He has experienced significant functional decline, spending most of his time sitting and unable to ambulate. He is intermittently alert, with variable lucidity. He requires assistance for toileting and uses a bedside commode, but is able to communicate his needs. Mobility is limited to assisted transfers.  His diabetes is reportedly stable and improving per endocrinology.       ECOG PS- 3 Pain scale- 3   Review of systems- Review of Systems  Constitutional:  Positive for malaise/fatigue. Negative for chills, fever and weight loss.  HENT:  Negative for congestion, ear discharge and nosebleeds.   Eyes:  Negative for blurred vision.  Respiratory:  Negative for cough, hemoptysis, sputum production, shortness of breath and wheezing.   Cardiovascular:  Negative for chest pain, palpitations, orthopnea and claudication.  Gastrointestinal:  Negative for abdominal pain, blood in stool, constipation, diarrhea, heartburn, melena, nausea and vomiting.  Genitourinary:  Negative for dysuria, flank pain, frequency, hematuria and urgency.  Musculoskeletal:  Negative for back pain, joint pain and myalgias.  Skin:  Negative for rash.  Neurological:  Negative for dizziness, tingling, focal weakness, seizures, weakness and headaches.  Endo/Heme/Allergies:  Does not bruise/bleed easily.  Psychiatric/Behavioral:  Negative for depression and suicidal ideas. The patient does not have insomnia.       Allergies[1]   Past Medical History:  Diagnosis Date   Acute metabolic encephalopathy 12/02/2020   DKA (diabetic ketoacidoses) 04/06/2016   Hypercholesteremia    Hypertension      Past Surgical History:  Procedure Laterality Date   AMPUTATION TOE Left 04/25/2022   Procedure: LEFT GREAT TOE AMPUTATION AND LEFT PARTIAL 2ND TOE AMPUTATION;  Surgeon:  Janit Thresa HERO, DPM;  Location: [facility] ORS;  Service: Podiatry;  Laterality: Left;   COLONOSCOPY WITH PROPOFOL  N/A 02/01/2020   Procedure: COLONOSCOPY WITH PROPOFOL ;  Surgeon: Unk Corinn Skiff, MD;  Location: [facility] ENDOSCOPY;  Service: Gastroenterology;  Laterality: N/A;   ESOPHAGOGASTRODUODENOSCOPY  02/01/2020   Procedure: ESOPHAGOGASTRODUODENOSCOPY (EGD);  Surgeon: Unk Corinn Skiff, MD;  Location: [facility] ENDOSCOPY;  Service: Gastroenterology;;   ESOPHAGOGASTRODUODENOSCOPY (EGD) WITH PROPOFOL  N/A 06/07/2022   Procedure: ESOPHAGOGASTRODUODENOSCOPY (EGD) WITH PROPOFOL ;  Surgeon: Saintclair Jasper, MD;  Location: [facility] ENDOSCOPY;  Service: Gastroenterology;  Laterality: N/A;   KNEE SURGERY Right    Torn meniscus   KNEE SURGERY Left     Social History   Socioeconomic History   Marital status: Single    Spouse name: Not on file   Number of children: 3   Years of education: Not on file   Highest education level: High school graduate  Occupational History    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  Tobacco Use   Smoking status: Never   Smokeless tobacco: Never  Vaping Use   Vaping status: Never Used  Substance and Sexual Activity   Alcohol use: Not Currently    Alcohol/week: 0.0 - 1.0 standard drinks of alcohol    Comment: once every 2 months  Drug use: Yes    Types: Marijuana, Crack cocaine    Comment: last week   Sexual activity: Not Currently    Birth control/protection: None  Other Topics Concern   Not on file  Social History Narrative   Lives with girlfriend sharon   Social Drivers of Health   Tobacco Use: Low Risk (03/28/2024)   Patient History    Smoking Tobacco Use: Never    Smokeless Tobacco Use: Never    Passive Exposure: Not on file  Recent Concern: Tobacco Use - Medium Risk (03/27/2024)   Received [facility]   Patient History    Smoking Tobacco Use: Never    Smokeless Tobacco Use: Never    Passive Exposure: Current  Financial Resource Strain: Low  Risk  (10/20/2023)   Received from [facility]   Overall Financial Resource Strain (CARDIA)    Difficulty of Paying Living Expenses: Not hard at all  Food Insecurity: Patient Unable To Answer (12/26/2023)   Epic    Worried About Programme Researcher, Broadcasting/film/video in the Last Year: Patient unable to answer    Ran Out of Food in the Last Year: Patient unable to answer  Transportation Needs: No Transportation Needs (11/16/2023)   Epic    Lack of Transportation (Medical): No    Lack of Transportation (Non-Medical): No  Physical Activity: Not on file  Stress: Not on file  Social Connections: Moderately Isolated (11/16/2023)   Social Connection and Isolation Panel    Frequency of Communication with Friends and Family: More than three times a week    Frequency of Social Gatherings with Friends and Family: More than three times a week    Attends Religious Services: Never    Database Administrator or Organizations: No    Attends Banker Meetings: Never    Marital Status: Living with partner  Intimate Partner Violence: Not At Risk (12/26/2023)   Epic    Fear of Current or Ex-Partner: No    Emotionally Abused: No    Physically Abused: No    Sexually Abused: No  Depression (PHQ2-9): Low Risk (02/29/2024)   Depression (PHQ2-9)    PHQ-2 Score: 0  Alcohol Screen: Not on file  Housing: Unknown (11/16/2023)   Epic    Unable to Pay for Housing in the Last Year: No    Number of Times Moved in the Last Year: Not on file    Homeless in the Last Year: No  Utilities: Not At Risk (11/16/2023)   Epic    Threatened with loss of utilities: No  Health Literacy: Not on file    Family History  Problem Relation Age of Onset   Heart attack Father    Hypertension Sister    Cancer Sister     Current Medications[2]  Physical exam:  Vitals:   03/28/24 0913  BP: 119/71  Pulse: 67  Resp: 20  Temp: 98.6 F (37 C)  SpO2: 100%  Weight: 152 lb 4.8 oz (69.1 kg)   Physical  Exam Cardiovascular:     Rate and Rhythm: Normal rate and regular rhythm.     Heart sounds: Normal heart sounds.  Pulmonary:     Effort: Pulmonary effort is normal.     Breath sounds: Normal breath sounds.  Skin:    General: Skin is warm and dry.  Neurological:     Mental Status: He is alert and oriented to person, place, and time.      I have  personally reviewed labs listed below:    Latest Ref Rng & Units 03/28/2024    8:45 AM  CMP  Glucose 70 - 99 mg/dL 834   BUN 8 - 23 mg/dL 19   Creatinine 9.38 - 1.24 mg/dL 9.03   Sodium 864 - 854 mmol/L 139   Potassium 3.5 - 5.1 mmol/L 4.1   Chloride 98 - 111 mmol/L 103   CO2 22 - 32 mmol/L 28   Calcium  8.9 - 10.3 mg/dL 8.4   Total Protein 6.5 - 8.1 g/dL 7.8   Total Bilirubin 0.0 - 1.2 mg/dL 0.4   Alkaline Phos 38 - 126 U/L 88   AST 15 - 41 U/L 17   ALT 0 - 44 U/L 20       Latest Ref Rng & Units 03/28/2024    8:45 AM  CBC  WBC 4.0 - 10.5 K/uL 2.4   Hemoglobin 13.0 - 17.0 g/dL 8.1   Hematocrit 60.9 - 52.0 % 26.0   Platelets 150 - 400 K/uL 252      Assessment and plan- Patient is a 73 y.o. male with history of high risk IgG lambda multiple myeloma R-ISS stage III presently on Pomalyst  plus Darzalex  here for routine follow-up  Assessment and Plan    Multiple myeloma high risk not having achieved remission Gradual increase in M protein over the last 6 to 7 months from 2.5 g to 3.2 g.  Free light chain lambda similarly shows a gradual increase over the last 8 months.  He is presently on Darzalex  and Pomalyst .  Patient has a poor performance status including being bound to wheelchair since his history of intracerebral hemorrhage with hemiplegia and chronic left humerus fracture with functional impairment.  I am therefore hesitant to switch him to carfilzomib which also carries potential cardiovascular risks.    Patient has leukopenia today with a white count of 2.4 and ANC of 1.  I am therefore deferring Darzalex  out by 2 weeks and  have also asked him to hold Pomalyst  at this time.   - Discussed potential therapy switch if current regimen ineffective or intolerable.  Chemotherapy-induced anemia Receiving erythropoietin  for supportive care. - Administered erythropoietin  injection for anemia today.         Visit Diagnosis 1. Multiple myeloma not having achieved remission (HCC)      Dr. Annah Skene, MD, MPH [facility] [phone] 03/28/2024 12:28 PM                    [1]  Allergies Allergen Reactions   Penicillins Other (See Comments)    Childhood allergy -tolerated amoxil  03/2022 Unknown reaction Has patient had a PCN reaction causing immediate rash, facial/tongue/throat swelling, SOB or lightheadedness with hypotension: No Has patient had a PCN reaction causing severe rash involving mucus membranes or skin necrosis: No Has patient had a PCN reaction that required hospitalization No Has patient had a PCN reaction occurring within the last 10 years: No If all of the above answers are NO, then may proceed with Cephalosporin use.   [2]  Current Outpatient Medications:    acetaminophen  (TYLENOL ) 325 MG tablet, Take 2 tablets (650 mg total) by mouth every 6 (six) hours as needed for mild pain (pain score 1-3) or fever., Disp: , Rfl:    acyclovir  (ZOVIRAX ) 400 MG tablet, Take 1 tablet by mouth twice daily, Disp: 60 tablet, Rfl: 3   atorvastatin  (LIPITOR ) 80 MG tablet, Take 1 tablet (80 mg total)  by mouth at bedtime. Hold while taking Paxlovid , Disp: 30 tablet, Rfl: 2   Continuous Glucose Sensor (DEXCOM G7 SENSOR) MISC, SMARTSIG:Every 10 Days, Disp: , Rfl:    docusate sodium  (COLACE) 100 MG capsule, Take 1 capsule (100 mg total) by mouth 2 (two) times daily., Disp: 10 capsule, Rfl: 0   Fe Fum-Vit C-Vit B12-FA (TRIGELS-F FORTE) CAPS capsule, Take 1 capsule by mouth 2 (two) times daily., Disp: , Rfl:    gabapentin  (NEURONTIN ) 300 MG capsule, Take 1 capsule (300 mg  total) by mouth 2 (two) times daily., Disp: 60 capsule, Rfl: 0   Insulin  Infusion Pump Supplies (TANDEM T:SLIM ASFT XC PK10 23) MISC, , Disp: , Rfl:    insulin  lispro (HUMALOG) 100 UNIT/ML injection, Inject 0-12 Units into the skin 4 (four) times daily -  with meals and at bedtime. Inject as per sliding scale:  If 150-200=    0 units 201-250=       3 units 251-300=       5 units 301-350=      7 units 351-400=      9 units For blood sugar >400, give 10 units and notify MD/NP (Patient taking differently: Inject 0-12 Units into the skin 4 (four) times daily -  with meals and at bedtime. Inject as per sliding scale:  If 150-200=    0 units 201-250=       3 units 251-300=       5 units 301-350=      7 units 351-400=      9 units For blood sugar >400, give 10 units and notify MD/NP), Disp: , Rfl:    latanoprost  (XALATAN ) 0.005 % ophthalmic solution, Place 1 drop into both eyes at bedtime., Disp: , Rfl:    levETIRAcetam  (KEPPRA ) 500 MG tablet, Take 1 tablet by mouth twice daily, Disp: 60 tablet, Rfl: 0   midodrine  (PROAMATINE ) 2.5 MG tablet, Take 1 tablet (2.5 mg total) by mouth 3 (three) times daily as needed (For systolic blood pressure less than 100)., Disp: , Rfl:    Multiple Vitamin (MULTIVITAMIN WITH MINERALS) TABS tablet, Take 1 tablet by mouth daily., Disp: , Rfl:    mupirocin  ointment (BACTROBAN ) 2 %, Apply topically., Disp: , Rfl:    nystatin (MYCOSTATIN/NYSTOP) powder, APPLY TO THE PALM OF THE HAND ONCE DAILY WHERE FUNGAL RASH IS PRESENT, Disp: , Rfl:    polyethylene glycol (MIRALAX  / GLYCOLAX ) 17 g packet, Take 17 g by mouth daily as needed for mild constipation., Disp: 14 each, Rfl: 0   POMALYST  2 MG capsule, TAKE 1 CAPSULE BY MOUTH EVERY DAY., Disp: 21 capsule, Rfl: 0 No current facility-administered medications for this visit.  Facility-Administered Medications Ordered in Other Visits:    epoetin  alfa-epbx (RETACRIT ) injection 40,000 Units, 40,000 Units, Subcutaneous, Q21 days, Melanee Annah BROCKS,  MD, 40,000 Units at 07/09/23 1153   epoetin  alfa-epbx (RETACRIT ) injection 40,000 Units, 40,000 Units, Subcutaneous, Q21 days, Melanee Annah BROCKS, MD, 40,000 Units at 03/28/24 1030  "

## 2024-03-29 ENCOUNTER — Other Ambulatory Visit: Payer: Self-pay

## 2024-03-29 LAB — KAPPA/LAMBDA LIGHT CHAINS
Kappa free light chain: 7.5 mg/L (ref 3.3–19.4)
Kappa, lambda light chain ratio: 0.03 — ABNORMAL LOW (ref 0.26–1.65)
Lambda free light chains: 262.9 mg/L — ABNORMAL HIGH (ref 5.7–26.3)

## 2024-03-31 LAB — MULTIPLE MYELOMA PANEL, SERUM
Albumin SerPl Elph-Mcnc: 2.9 g/dL (ref 2.9–4.4)
Albumin/Glob SerPl: 0.7 (ref 0.7–1.7)
Alpha 1: 0.2 g/dL (ref 0.0–0.4)
Alpha2 Glob SerPl Elph-Mcnc: 0.6 g/dL (ref 0.4–1.0)
B-Globulin SerPl Elph-Mcnc: 0.6 g/dL — ABNORMAL LOW (ref 0.7–1.3)
Gamma Glob SerPl Elph-Mcnc: 3 g/dL — ABNORMAL HIGH (ref 0.4–1.8)
Globulin, Total: 4.5 g/dL — ABNORMAL HIGH (ref 2.2–3.9)
IgA: 23 mg/dL — ABNORMAL LOW (ref 61–437)
IgG (Immunoglobin G), Serum: 3657 mg/dL — ABNORMAL HIGH (ref 603–1613)
IgM (Immunoglobulin M), Srm: <5 mg/dL — ABNORMAL LOW (ref 15–143)
M Protein SerPl Elph-Mcnc: 2.8 g/dL — ABNORMAL HIGH
Total Protein ELP: 7.4 g/dL (ref 6.0–8.5)

## 2024-04-04 ENCOUNTER — Encounter: Attending: Physician Assistant | Admitting: Physician Assistant

## 2024-04-04 ENCOUNTER — Other Ambulatory Visit: Payer: Self-pay | Admitting: Oncology

## 2024-04-04 DIAGNOSIS — M6281 Muscle weakness (generalized): Secondary | ICD-10-CM | POA: Diagnosis not present

## 2024-04-04 DIAGNOSIS — I618 Other nontraumatic intracerebral hemorrhage: Secondary | ICD-10-CM | POA: Diagnosis not present

## 2024-04-04 DIAGNOSIS — N183 Chronic kidney disease, stage 3 unspecified: Secondary | ICD-10-CM | POA: Diagnosis not present

## 2024-04-04 DIAGNOSIS — L89613 Pressure ulcer of right heel, stage 3: Secondary | ICD-10-CM | POA: Diagnosis not present

## 2024-04-04 DIAGNOSIS — E10621 Type 1 diabetes mellitus with foot ulcer: Secondary | ICD-10-CM | POA: Insufficient documentation

## 2024-04-04 DIAGNOSIS — C9000 Multiple myeloma not having achieved remission: Secondary | ICD-10-CM

## 2024-04-05 ENCOUNTER — Encounter: Payer: Self-pay | Admitting: Oncology

## 2024-04-05 NOTE — Telephone Encounter (Signed)
 Per OV note 12/30 Patient has leukopenia today with a white count of 2.4 and ANC of 1. I am therefore deferring Darzalex  out by 2 weeks and have also asked him to hold Pomalyst  at this time.  Per Dr. Melanee refill but tell signifciant other to not give it for now.  Outbound call; spoke to Wayne informed of above. Caregiver verbalized understanding.

## 2024-04-08 ENCOUNTER — Inpatient Hospital Stay

## 2024-04-08 ENCOUNTER — Other Ambulatory Visit: Payer: Self-pay

## 2024-04-08 ENCOUNTER — Observation Stay
Admission: EM | Admit: 2024-04-08 | Discharge: 2024-04-09 | Disposition: A | Attending: Internal Medicine | Admitting: Internal Medicine

## 2024-04-08 DIAGNOSIS — G40909 Epilepsy, unspecified, not intractable, without status epilepticus: Secondary | ICD-10-CM | POA: Insufficient documentation

## 2024-04-08 DIAGNOSIS — G629 Polyneuropathy, unspecified: Secondary | ICD-10-CM | POA: Insufficient documentation

## 2024-04-08 DIAGNOSIS — E785 Hyperlipidemia, unspecified: Secondary | ICD-10-CM | POA: Diagnosis present

## 2024-04-08 DIAGNOSIS — Z794 Long term (current) use of insulin: Secondary | ICD-10-CM | POA: Diagnosis not present

## 2024-04-08 DIAGNOSIS — E1069 Type 1 diabetes mellitus with other specified complication: Secondary | ICD-10-CM | POA: Diagnosis present

## 2024-04-08 DIAGNOSIS — G9341 Metabolic encephalopathy: Secondary | ICD-10-CM | POA: Diagnosis present

## 2024-04-08 DIAGNOSIS — I10 Essential (primary) hypertension: Secondary | ICD-10-CM | POA: Diagnosis not present

## 2024-04-08 DIAGNOSIS — E1065 Type 1 diabetes mellitus with hyperglycemia: Principal | ICD-10-CM | POA: Diagnosis present

## 2024-04-08 DIAGNOSIS — E872 Acidosis, unspecified: Principal | ICD-10-CM

## 2024-04-08 DIAGNOSIS — E1100 Type 2 diabetes mellitus with hyperosmolarity without nonketotic hyperglycemic-hyperosmolar coma (NKHHC): Secondary | ICD-10-CM | POA: Diagnosis present

## 2024-04-08 DIAGNOSIS — R739 Hyperglycemia, unspecified: Secondary | ICD-10-CM | POA: Diagnosis present

## 2024-04-08 DIAGNOSIS — Z79899 Other long term (current) drug therapy: Secondary | ICD-10-CM | POA: Diagnosis not present

## 2024-04-08 DIAGNOSIS — E1010 Type 1 diabetes mellitus with ketoacidosis without coma: Secondary | ICD-10-CM

## 2024-04-08 LAB — URINE DRUG SCREEN
Amphetamines: NEGATIVE
Barbiturates: NEGATIVE
Benzodiazepines: NEGATIVE
Cocaine: NEGATIVE
Fentanyl: NEGATIVE
Methadone Scn, Ur: NEGATIVE
Opiates: NEGATIVE
Tetrahydrocannabinol: NEGATIVE

## 2024-04-08 LAB — BLOOD GAS, VENOUS
Acid-base deficit: 5.5 mmol/L — ABNORMAL HIGH (ref 0.0–2.0)
Bicarbonate: 22.6 mmol/L (ref 20.0–28.0)
O2 Saturation: 41.2 %
Patient temperature: 37
pCO2, Ven: 54 mmHg (ref 44–60)
pH, Ven: 7.23 — ABNORMAL LOW (ref 7.25–7.43)

## 2024-04-08 LAB — BASIC METABOLIC PANEL WITH GFR
Anion gap: 14 (ref 5–15)
Anion gap: 8 (ref 5–15)
BUN: 29 mg/dL — ABNORMAL HIGH (ref 8–23)
BUN: 25 mg/dL — ABNORMAL HIGH (ref 8–23)
CO2: 22 mmol/L (ref 22–32)
CO2: 25 mmol/L (ref 22–32)
Calcium: 9 mg/dL (ref 8.9–10.3)
Calcium: 8.1 mg/dL — ABNORMAL LOW (ref 8.9–10.3)
Chloride: 101 mmol/L (ref 98–111)
Chloride: 108 mmol/L (ref 98–111)
Creatinine, Ser: 1.36 mg/dL — ABNORMAL HIGH (ref 0.61–1.24)
Creatinine, Ser: 1.09 mg/dL (ref 0.61–1.24)
GFR, Estimated: 56 mL/min — ABNORMAL LOW
GFR, Estimated: >60 mL/min
Glucose, Bld: 372 mg/dL — ABNORMAL HIGH (ref 70–99)
Glucose, Bld: 156 mg/dL — ABNORMAL HIGH (ref 70–99)
Potassium: 3.9 mmol/L (ref 3.5–5.1)
Potassium: 3.8 mmol/L (ref 3.5–5.1)
Sodium: 137 mmol/L (ref 135–145)
Sodium: 140 mmol/L (ref 135–145)

## 2024-04-08 LAB — CBC
HCT: 28.4 % — ABNORMAL LOW (ref 39.0–52.0)
Hemoglobin: 8.7 g/dL — ABNORMAL LOW (ref 13.0–17.0)
MCH: 30.9 pg (ref 26.0–34.0)
MCHC: 30.6 g/dL (ref 30.0–36.0)
MCV: 100.7 fL — ABNORMAL HIGH (ref 80.0–100.0)
Platelets: 253 K/uL (ref 150–400)
RBC: 2.82 MIL/uL — ABNORMAL LOW (ref 4.22–5.81)
RDW: 16.7 % — ABNORMAL HIGH (ref 11.5–15.5)
WBC: 3.2 K/uL — ABNORMAL LOW (ref 4.0–10.5)
nRBC: 0 % (ref 0.0–0.2)

## 2024-04-08 LAB — URINALYSIS, ROUTINE W REFLEX MICROSCOPIC
Bilirubin Urine: NEGATIVE
Glucose, UA: >=500 mg/dL — AB
Hgb urine dipstick: NEGATIVE
Ketones, ur: 20 mg/dL — AB
Nitrite: NEGATIVE
Protein, ur: NEGATIVE mg/dL
Specific Gravity, Urine: 1.016 (ref 1.005–1.030)
pH: 5 (ref 5.0–8.0)

## 2024-04-08 LAB — CBG MONITORING, ED
Glucose-Capillary: 291 mg/dL — ABNORMAL HIGH (ref 70–99)
Glucose-Capillary: 262 mg/dL — ABNORMAL HIGH (ref 70–99)
Glucose-Capillary: 170 mg/dL — ABNORMAL HIGH (ref 70–99)
Glucose-Capillary: 156 mg/dL — ABNORMAL HIGH (ref 70–99)
Glucose-Capillary: 117 mg/dL — ABNORMAL HIGH (ref 70–99)
Glucose-Capillary: 135 mg/dL — ABNORMAL HIGH (ref 70–99)

## 2024-04-08 LAB — BETA-HYDROXYBUTYRIC ACID: Beta-Hydroxybutyric Acid: 3.2 mmol/L — ABNORMAL HIGH (ref 0.05–0.27)

## 2024-04-08 LAB — ETHANOL: Alcohol, Ethyl (B): <15 mg/dL

## 2024-04-08 LAB — MAGNESIUM: Magnesium: 1.7 mg/dL (ref 1.7–2.4)

## 2024-04-08 MED ORDER — INSULIN REGULAR(HUMAN) IN NACL 100-0.9 UT/100ML-% IV SOLN
INTRAVENOUS | Status: DC
  Administered 2024-04-08: 6.5 [IU]/h via INTRAVENOUS
  Filled 2024-04-08: qty 100

## 2024-04-08 MED ORDER — INSULIN ASPART 100 UNIT/ML IJ SOLN
0.0000 [IU] | INTRAMUSCULAR | Status: DC
  Administered 2024-04-09: 3 [IU] via SUBCUTANEOUS
  Filled 2024-04-08: qty 3

## 2024-04-08 MED ORDER — DEXTROSE IN LACTATED RINGERS 5 % IV SOLN: INTRAVENOUS | Status: DC

## 2024-04-08 MED ORDER — LACTATED RINGERS IV SOLN: INTRAVENOUS | Status: DC

## 2024-04-08 MED ORDER — ACETAMINOPHEN 325 MG PO TABS: 650.0000 mg | ORAL_TABLET | Freq: Four times a day (QID) | ORAL | Status: DC | PRN

## 2024-04-08 MED ORDER — SODIUM CHLORIDE 0.9 % IV BOLUS
500.0000 mL | Freq: Once | INTRAVENOUS | Status: AC
  Administered 2024-04-08: 500 mL via INTRAVENOUS

## 2024-04-08 MED ORDER — POTASSIUM CHLORIDE 10 MEQ/100ML IV SOLN
10.0000 meq | INTRAVENOUS | Status: AC
  Administered 2024-04-08 (×2): 10 meq via INTRAVENOUS
  Filled 2024-04-08 (×2): qty 100

## 2024-04-08 MED ORDER — SODIUM CHLORIDE 0.9 % IV BOLUS
1000.0000 mL | Freq: Once | INTRAVENOUS | Status: AC
  Administered 2024-04-08: 1000 mL via INTRAVENOUS

## 2024-04-08 MED ORDER — DEXTROSE 50 % IV SOLN: 0.0000 mL | INTRAVENOUS | Status: DC | PRN

## 2024-04-08 MED ORDER — ACETAMINOPHEN 325 MG RE SUPP: 650.0000 mg | Freq: Four times a day (QID) | RECTAL | Status: DC | PRN

## 2024-04-08 MED ORDER — ENOXAPARIN SODIUM 40 MG/0.4ML IJ SOSY
40.0000 mg | PREFILLED_SYRINGE | Freq: Every day | INTRAMUSCULAR | Status: DC
  Administered 2024-04-08: 40 mg via SUBCUTANEOUS
  Filled 2024-04-08: qty 0.4

## 2024-04-08 MED ORDER — MAGNESIUM HYDROXIDE 400 MG/5ML PO SUSP: 30.0000 mL | Freq: Every day | ORAL | Status: DC | PRN

## 2024-04-08 NOTE — ED Notes (Signed)
 Two nurses attempted to straight catheterized patient. Urethra not open, MD aware.

## 2024-04-08 NOTE — ED Triage Notes (Signed)
 First nurse note: Pt to ED via [facility] from home. Called for hyperglycemia. Pt reports CBG reading high x2 days. Denies pain. Some fatigue.  CBG 476 114/70 99% RA  HR 80

## 2024-04-08 NOTE — ED Notes (Signed)
 This RN and Dagoberto PEAK cleaned patient and sheets. Pt had large diarrhea episode that leaked out of his brief. Appeared green/brown in color.

## 2024-04-08 NOTE — ED Provider Notes (Signed)
 "  [facility] Provider Note    Event Date/Time   First MD Initiated Contact with Patient 04/08/24 1642     (approximate)   History   Hyperglycemia   HPI  Nicholas Weiss is a 74 y.o. male  uncontrolled type 1 diabetes (with insulin  pump), chronic kidney disease stage III, multiple myeloma who presents from EMS from home with elevated glucose.  Per report patient has had an elevated glucose but has read high for the past 48 hours.  Patient was initially triaged to the waiting room and glucose per EMS was 476.  Patient subsequently brought back to her room and patient is not able to answer many questions initially he does open his eyes states that he does not feel good, moves all extremities but otherwise history is limited.      Physical Exam   Triage Vital Signs: ED Triage Vitals  Encounter Vitals Group     BP 04/08/24 1634 (!) 81/57     Girls Systolic BP Percentile --      Girls Diastolic BP Percentile --      Boys Systolic BP Percentile --      Boys Diastolic BP Percentile --      Pulse Rate 04/08/24 1634 87     Resp 04/08/24 1634 16     Temp 04/08/24 1634 97.8 F (36.6 C)     Temp src --      SpO2 04/08/24 1634 96 %     Weight 04/08/24 1635 152 lb 5.4 oz (69.1 kg)     Height 04/08/24 1635 5' 10 (1.778 m)     Head Circumference --      Peak Flow --      Pain Score 04/08/24 1635 0     Pain Loc --      Pain Education --      Exclude from Growth Chart --     Most recent vital signs: Vitals:   04/08/24 1745 04/08/24 1752  BP:  (!) 148/84  Pulse: 69 69  Resp: 10 18  Temp:  98 F (36.7 C)  SpO2: 100% 100%    Nursing Triage Note reviewed. Vital signs reviewed and patients oxygen saturation is normoxic  General: Patient is thin, lethargic, does not appear well Head: Normocephalic and atraumatic Eyes: Normal inspection, extraocular muscles intact, no conjunctival pallor Ear, nose, throat: Normal external exam Neck: Normal range of  motion Respiratory: Patient is in no respiratory distress, lungs CTAB Cardiovascular: Patient is not tachycardic, RR GI: Abd SNT with no guarding or rebound  Insulin  pump in place, which was removed Back: Normal inspection of the back with good strength and range of motion throughout all ext Extremities: pulses intact with good cap refills, no LE pitting edema or calf tenderness Patient's left hand was in a dressing for unclear cause, this was taken down I do not appreciate any cellulitis  Neuro: The patient is lethargic, orientated x 2 with 4/5 bilat UE/LE strength, no gross motor or sensory defects noted.  Patient is a two-person assist into bed   ED Results / Procedures / Treatments   Labs (all labs ordered are listed, but only abnormal results are displayed) Labs Reviewed  BASIC METABOLIC PANEL WITH GFR - Abnormal; Notable for the following components:      Result Value   Glucose, Bld 372 (*)    BUN 29 (*)    Creatinine, Ser 1.36 (*)    GFR, Estimated 56 (*)  All other components within normal limits  CBC - Abnormal; Notable for the following components:   WBC 3.2 (*)    RBC 2.82 (*)    Hemoglobin 8.7 (*)    HCT 28.4 (*)    MCV 100.7 (*)    RDW 16.7 (*)    All other components within normal limits  BLOOD GAS, VENOUS - Abnormal; Notable for the following components:   pH, Ven 7.23 (*)    Acid-base deficit 5.5 (*)    All other components within normal limits  BETA-HYDROXYBUTYRIC ACID - Abnormal; Notable for the following components:   Beta-Hydroxybutyric Acid 3.20 (*)    All other components within normal limits  CBG MONITORING, ED - Abnormal; Notable for the following components:   Glucose-Capillary 291 (*)    All other components within normal limits  CBG MONITORING, ED - Abnormal; Notable for the following components:   Glucose-Capillary 262 (*)    All other components within normal limits  URINALYSIS, ROUTINE W REFLEX MICROSCOPIC  URINE DRUG SCREEN  MAGNESIUM    BASIC METABOLIC PANEL WITH GFR  BASIC METABOLIC PANEL WITH GFR  BASIC METABOLIC PANEL WITH GFR  BASIC METABOLIC PANEL WITH GFR  BETA-HYDROXYBUTYRIC ACID  BETA-HYDROXYBUTYRIC ACID  BETA-HYDROXYBUTYRIC ACID  BETA-HYDROXYBUTYRIC ACID  ETHANOL     EKG EKG and rhythm strip are interpreted by myself:   EKG: [Normal sinus rhythm] at heart rate of 85, normal QRS duration, QTc 483, nonspecific ST segments and T waves no ectopy EKG not consistent with Acute STEMI Rhythm strip: NSR in lead II   RADIOLOGY None    PROCEDURES:  Critical Care performed: Yes, see critical care procedure note(s)  .Critical Care  Performed by: Nicholaus Rolland BRAVO, MD Authorized by: Nicholaus Rolland BRAVO, MD   Critical care provider statement:    Critical care time (minutes):  32   Critical care was necessary to treat or prevent imminent or life-threatening deterioration of the following conditions:  Endocrine crisis   Critical care was time spent personally by me on the following activities:  Development of treatment plan with patient or surrogate, discussions with consultants, evaluation of patient's response to treatment, examination of patient, ordering and review of laboratory studies, ordering and review of radiographic studies, ordering and performing treatments and interventions, pulse oximetry, re-evaluation of patient's condition and review of old charts   Care discussed with: admitting provider   Comments:     DKA requiring insulin  drip    MEDICATIONS ORDERED IN ED: Medications  insulin  regular, human (MYXREDLIN ) 100 units/ 100 mL infusion (has no administration in time range)  lactated ringers  infusion (has no administration in time range)  dextrose  5 % in lactated ringers  infusion (has no administration in time range)  dextrose  50 % solution 0-50 mL (has no administration in time range)  potassium chloride  10 mEq in 100 mL IVPB (has no administration in time range)  sodium chloride  0.9 % bolus  1,000 mL (1,000 mLs Intravenous New Bag/Given 04/08/24 1706)  sodium chloride  0.9 % bolus 500 mL (500 mLs Intravenous New Bag/Given 04/08/24 1707)     IMPRESSION / MDM / ASSESSMENT AND PLAN / ED COURSE                                Differential diagnosis includes, but is not limited to, DKA, UTI, substance use, electrolyte derangement, anemia  ED course: When I first met patient he appears very encephalopathic but no  focal neurological deficits.  He was wheeled from triage to our bed in the emergency department and required a two-person assist to move from the wheelchair to the bed.  IV inserted and he was given 1500 of normal saline.  Blood work demonstrated mildly elevated glucose but no profound elevated anion gap and CO2 was not deviated however patient's blood gas does demonstrate an acidosis he does have an elevated beta hydroxybutyrate.  He does not have a significant leukocytosis or profound anemia requiring any transfusions.  Nursing has attempted to obtain urine however they were unable to straight cath him.  He does not have a significant acute renal insufficiency so we will BladderScan him and obtain a condom cath if not significantly elevated.  Given his mixed picture I do think DKA is likely and we will start him on an insulin  drip.  Of note his mentation has improved after IV fluid.  Case discussed with hospitalist for admission   Clinical Course as of 04/08/24 1919  Sat Apr 08, 2024  1704 Insulin  pump disconnected upon patient arrival [HD]  1807 Anion gap: 14 No anion gap [HD]  1835 Blood gas, venous(!) Acidosis [HD]  1835 Beta-hydroxybutyric acid(!) Can be consistent with DKA [HD]  1851 Nursing attempted to straight cath patient but unable to advance ureter [HD]  1907 Patient reassessed and he is now able to answer more questions.  States that he has been eating a lot of sweets and has not been bolusing using his insulin  pump.  Denies any chest pain abdominal pain SI HI [HD]     Clinical Course User Index [HD] Nicholaus Rolland BRAVO, MD   -- Risk: 5 This patient has a high risk of morbidity due to further diagnostic testing or treatment. Rationale: This patients evaluation and management involve a high risk of morbidity due to the potential severity of presenting symptoms, need for diagnostic testing, and/or initiation of treatment that may require close monitoring. The differential includes conditions with potential for significant deterioration or requiring escalation of care. Treatment decisions in the ED, including medication administration, procedural interventions, or disposition planning, reflect this level of risk. COPA: 5 The patient has the following acute or chronic illness/injury that poses a possible threat to life or bodily function: [X] : The patient has a potentially serious acute condition or an acute exacerbation of a chronic illness requiring urgent evaluation and management in the Emergency Department. The clinical presentation necessitates immediate consideration of life-threatening or function-threatening diagnoses, even if they are ultimately ruled out.   FINAL CLINICAL IMPRESSION(S) / ED DIAGNOSES   Final diagnoses:  Normal anion gap metabolic acidosis  Metabolic encephalopathy  Diabetic ketoacidosis without coma associated with type 1 diabetes mellitus (HCC)     Rx / DC Orders   ED Discharge Orders     None        Note:  This document was prepared using Dragon voice recognition software and may include unintentional dictation errors.   Nicholaus Rolland BRAVO, MD 04/08/24 1919  "

## 2024-04-08 NOTE — H&P (Incomplete)
 "     [place]   PATIENT NAME: Nicholas Weiss    MR#:  [id number]  DATE OF BIRTH:  [birth year]  DATE OF ADMISSION:  04/08/2024  PRIMARY CARE PHYSICIAN: Alla Amis, MD   Patient is coming from: ***  REQUESTING/REFERRING PHYSICIAN: ***  CHIEF COMPLAINT:   Chief Complaint  Patient presents with   Hyperglycemia    HISTORY OF PRESENT ILLNESS:  Nicholas Weiss is a 74 y.o. male with medical history significant for ***  ED Course: *** EKG as reviewed by me : *** Imaging: *** PAST MEDICAL HISTORY:   Past Medical History:  Diagnosis Date   Acute metabolic encephalopathy 12/02/2020   DKA (diabetic ketoacidoses) 04/06/2016   Hypercholesteremia    Hypertension     PAST SURGICAL HISTORY:   Past Surgical History:  Procedure Laterality Date   AMPUTATION TOE Left 04/25/2022   Procedure: LEFT GREAT TOE AMPUTATION AND LEFT PARTIAL 2ND TOE AMPUTATION;  Surgeon: Janit Thresa HERO, DPM;  Location: [facility] ORS;  Service: Podiatry;  Laterality: Left;   COLONOSCOPY WITH PROPOFOL  N/A 02/01/2020   Procedure: COLONOSCOPY WITH PROPOFOL ;  Surgeon: Unk Corinn Skiff, MD;  Location: [facility] ENDOSCOPY;  Service: Gastroenterology;  Laterality: N/A;   ESOPHAGOGASTRODUODENOSCOPY  02/01/2020   Procedure: ESOPHAGOGASTRODUODENOSCOPY (EGD);  Surgeon: Unk Corinn Skiff, MD;  Location: [facility] ENDOSCOPY;  Service: Gastroenterology;;   ESOPHAGOGASTRODUODENOSCOPY (EGD) WITH PROPOFOL  N/A 06/07/2022   Procedure: ESOPHAGOGASTRODUODENOSCOPY (EGD) WITH PROPOFOL ;  Surgeon: Saintclair Jasper, MD;  Location: [facility] ENDOSCOPY;  Service: Gastroenterology;  Laterality: N/A;   KNEE SURGERY Right    Torn meniscus   KNEE SURGERY Left     SOCIAL HISTORY:   Social History   Tobacco Use   Smoking status: Never   Smokeless tobacco: Never  Substance Use Topics   Alcohol use: Not Currently    Alcohol/week: 0.0 - 1.0 standard drinks of alcohol    Comment: once every 2 months    FAMILY HISTORY:   Family  History  Problem Relation Age of Onset   Heart attack Father    Hypertension Sister    Cancer Sister     DRUG ALLERGIES:  Allergies[1]  REVIEW OF SYSTEMS:   ROS As per history of present illness. All pertinent systems were reviewed above. Constitutional, HEENT, cardiovascular, respiratory, GI, GU, musculoskeletal, neuro, psychiatric, endocrine, integumentary and hematologic systems were reviewed and are otherwise negative/unremarkable except for positive findings mentioned above in the HPI.   MEDICATIONS AT HOME:   Prior to Admission medications  Medication Sig Start Date End Date Taking? Authorizing Provider  acetaminophen  (TYLENOL ) 325 MG tablet Take 2 tablets (650 mg total) by mouth every 6 (six) hours as needed for mild pain (pain score 1-3) or fever. 09/17/23  Yes Josette Ade, MD  acyclovir  (ZOVIRAX ) 400 MG tablet Take 1 tablet by mouth twice daily 03/14/24  Yes Rao, Archana C, MD  atorvastatin  (LIPITOR ) 80 MG tablet Take 1 tablet (80 mg total) by mouth at bedtime. Hold while taking Paxlovid  04/08/22  Yes Caleen Qualia, MD  bisacodyl  (DULCOLAX) 5 MG EC tablet Take 5 mg by mouth daily as needed. 09/17/23  Yes [provider]  Fe Fum-Vit C-Vit B12-FA (TRIGELS-F FORTE) CAPS capsule Take 1 capsule by mouth 2 (two) times daily. 11/26/23  Yes Caleen Qualia, MD  gabapentin  (NEURONTIN ) 300 MG capsule Take 1 capsule (300 mg total) by mouth 2 (two) times daily. 09/17/23  Yes Josette Ade, MD  Insulin  Infusion Pump Supplies [facility] T:SLIM ASFT XC PK10 23) MISC  02/21/24  Yes [provider]  insulin  lispro (HUMALOG) 100 UNIT/ML injection Inject 0-12 Units into the skin 4 (four) times daily -  with meals and at bedtime. Inject as per sliding scale:  If 150-200=    0 units 201-250=       3 units 251-300=       5 units 301-350=      7 units 351-400=      9 units For blood sugar >400, give 10 units and notify MD/NP Patient taking differently: Inject 0-12 Units into  the skin 4 (four) times daily -  with meals and at bedtime. Inject as per sliding scale:  If 150-200=    0 units 201-250=       3 units 251-300=       5 units 301-350=      7 units 351-400=      9 units For blood sugar >400, give 10 units and notify MD/NP 10/26/23  Yes [provider]  latanoprost  (XALATAN ) 0.005 % ophthalmic solution Place 1 drop into both eyes at bedtime. 11/02/23  Yes [provider]  levETIRAcetam  (KEPPRA ) 500 MG tablet Take 1 tablet by mouth twice daily 03/13/24  Yes Vaslow, [name] K, MD  midodrine  (PROAMATINE ) 2.5 MG tablet Take 1 tablet (2.5 mg total) by mouth 3 (three) times daily as needed (For systolic blood pressure less than 100). 11/26/23  Yes Amin, Sumayya, MD  Multiple Vitamin (MULTIVITAMIN WITH MINERALS) TABS tablet Take 1 tablet by mouth daily. 11/27/23  Yes Amin, Sumayya, MD  nystatin (MYCOSTATIN/NYSTOP) powder APPLY TO THE PALM OF THE HAND ONCE DAILY WHERE FUNGAL RASH IS PRESENT 03/22/24  Yes [provider]  Continuous Glucose Sensor (DEXCOM G7 SENSOR) MISC SMARTSIG:Every 10 Days 01/19/24   [provider]  docusate sodium  (COLACE) 100 MG capsule Take 1 capsule (100 mg total) by mouth 2 (two) times daily. Patient not taking: Reported on 04/08/2024 12/29/23   Will Almarie MATSU, MD  mupirocin  ointment (BACTROBAN ) 2 % Apply topically. Patient not taking: Reported on 04/08/2024 02/17/24   [provider]  polyethylene glycol (MIRALAX  / GLYCOLAX ) 17 g packet Take 17 g by mouth daily as needed for mild constipation. Patient not taking: Reported on 04/08/2024 12/29/23   Will Almarie MATSU, MD  POMALYST  2 MG capsule TAKE 1 CAPSULE BY MOUTH EVERY DAY. Patient not taking: Reported on 04/08/2024 04/05/24   Melanee Annah BROCKS, MD      VITAL SIGNS:  Blood pressure (!) 144/73, pulse 60, temperature 97.6 F (36.4 C), temperature source Axillary, resp. rate 16, height 5' 10 (1.778 m), weight 69.1 kg, SpO2 100%.  PHYSICAL EXAMINATION:   Physical Exam  GENERAL:  74 y.o.-year-old patient lying in the bed with no acute distress.  EYES: Pupils equal, round, reactive to light and accommodation. No scleral icterus. Extraocular muscles intact.  HEENT: Head atraumatic, normocephalic. Oropharynx and nasopharynx clear.  NECK:  Supple, no jugular venous distention. No thyroid  enlargement, no tenderness.  LUNGS: Normal breath sounds bilaterally, no wheezing, rales,rhonchi or crepitation. No use of accessory muscles of respiration.  CARDIOVASCULAR: Regular rate and rhythm, S1, S2 normal. No murmurs, rubs, or gallops.  ABDOMEN: Soft, nondistended, nontender. Bowel sounds present. No organomegaly or mass.  EXTREMITIES: No pedal edema, cyanosis, or clubbing.  NEUROLOGIC: Cranial nerves II through XII are intact. Muscle strength 5/5 in all extremities. Sensation intact. Gait not checked.  PSYCHIATRIC: The patient is alert and oriented x 3.  Normal affect and good eye contact. SKIN: No obvious  rash, lesion, or ulcer.   LABORATORY PANEL:   CBC Recent Labs  Lab 04/08/24 1708  WBC 3.2*  HGB 8.7*  HCT 28.4*  PLT 253   ------------------------------------------------------------------------------------------------------------------  Chemistries  Recent Labs  Lab 04/08/24 1708 04/08/24 1718  NA 137  --   K 3.9  --   CL 101  --   CO2 22  --   GLUCOSE 372*  --   BUN 29*  --   CREATININE 1.36*  --   CALCIUM  9.0  --   MG  --  1.7   ------------------------------------------------------------------------------------------------------------------  Cardiac Enzymes No results for input(s): TROPONINI in the last 168 hours. ------------------------------------------------------------------------------------------------------------------  RADIOLOGY:  CT Head Wo Contrast Result Date: 04/08/2024 CLINICAL DATA:  Acute encephalopathy, hyperglycemia EXAM: CT HEAD WITHOUT CONTRAST TECHNIQUE: Contiguous axial images were obtained from  the base of the skull through the vertex without intravenous contrast. RADIATION DOSE REDUCTION: This exam was performed according to the departmental dose-optimization program which includes automated exposure control, adjustment of the mA and/or kV according to patient size and/or use of iterative reconstruction technique. COMPARISON:  01/04/2024 FINDINGS: Brain: No acute infarct or hemorrhage. Stable chronic small-vessel ischemic changes throughout the white matter. Lateral ventricles and midline structures are unremarkable. Benign calcifications are seen along the falx and tentorium. No acute extra-axial fluid collections. No mass effect. Vascular: No hyperdense vessel or unexpected calcification. Skull: Normal. Negative for fracture or focal lesion. Sinuses/Orbits: No acute finding. Other: None. IMPRESSION: 1. Stable head CT, no acute intracranial process. Electronically Signed   By: Ozell Daring M.D.   On: 04/08/2024 20:23      IMPRESSION AND PLAN:  Assessment and Plan: No notes have been filed under this hospital service. Service: Hospitalist      DVT prophylaxis: Lovenox ***  Advanced Care Planning:  Code Status: full code***  Family Communication:  The plan of care was discussed in details with the patient (and family). I answered all questions. The patient agreed to proceed with the above mentioned plan. Further management will depend upon hospital course. Disposition Plan: Back to previous home environment Consults called: none***  All the records are reviewed and case discussed with ED provider.  Status is: Inpatient {Inpatient:23812}   At the time of the admission, it appears that the appropriate admission status for this patient is inpatient.  This is judged to be reasonable and necessary in order to provide the required intensity of service to ensure the patient's safety given the presenting symptoms, physical exam findings and initial radiographic and laboratory data in the  context of comorbid conditions.  The patient requires inpatient status due to high intensity of service, high risk of further deterioration and high frequency of surveillance required.  I certify that at the time of admission, it is my clinical judgment that the patient will require inpatient hospital care extending more than 2 midnights.                            Dispo: The patient is from: Home              Anticipated d/c is to: Home              Patient currently is not medically stable to d/c.              Difficult to place patient: No  Madison DELENA Peaches M.D on 04/08/2024 at 11:39 PM  [facility]   From 7  PM-7 AM, contact night-coverage [email or web address]  CC: Primary care physician; Alla Amis, MD       [1] Allergies Allergen Reactions   Penicillins Other (See Comments)    Childhood allergy -tolerated amoxil  03/2022 Unknown reaction Has patient had a PCN reaction causing immediate rash, facial/tongue/throat swelling, SOB or lightheadedness with hypotension: No Has patient had a PCN reaction causing severe rash involving mucus membranes or skin necrosis: No Has patient had a PCN reaction that required hospitalization No Has patient had a PCN reaction occurring within the last 10 years: No If all of the above answers are NO, then may proceed with Cephalosporin use.   "

## 2024-04-08 NOTE — ED Notes (Signed)
 Blood sent down : 1 seet of cultures Lactic Blue Red no jelly 2 greens 1 purple

## 2024-04-08 NOTE — H&P (Signed)
 "     [place]   PATIENT NAME: Nicholas Weiss    MR#:  [id number]  DATE OF BIRTH:  1951/03/19  DATE OF ADMISSION:  04/08/2024  PRIMARY CARE PHYSICIAN: Alla Amis, MD   Patient is coming from: Home  REQUESTING/REFERRING PHYSICIAN: Nicholaus Crazier, MD  CHIEF COMPLAINT:   Chief Complaint  Patient presents with   Hyperglycemia    HISTORY OF PRESENT ILLNESS:  Nicholas Weiss is a 74 y.o. African-American male with medical history significant for type 1 diabetes mellitus, dyslipidemia, CVA with residual left-sided hemiparesis, chronic atrial fibrillation, coronary artery disease, stage III CKD, multiple myeloma and hypertension, who presented to the emergency room with acute onset of altered mental status with hyperglycemia.  His blood glucose has been reading high over the last 48 hours.  Per EMS it was 476.  The patient denied any nausea or vomiting or diarrhea.  No dysuria, oliguria or hematuria, urinary frequency or urgency or flank pain.  No significant polydipsia.  No chest pain or palpitations.  No cough or wheezing or dyspnea. ED Course: When he came to the ER BP was 148/84 with otherwise normal vital signs.  Labs revealed a blood glucose of 156 with a BUN of 25 and calcium  of 8.1 and VBG showed pH 7.23 with HCO3 of 22.6 and pCO2 of 54.  Anion gap was 8.  CBC showed leukopenia of 3.2 and hemoglobin 8.7 with hematocrit 28.4 better than previous levels with macrocytosis.  MiraLAX .  Was 3.2 and later 0.11 EKG as reviewed by me : EKG showed accelerated junctional rhythm with a rate of 85 with Q waves anteroseptally. Imaging: None.  The patient was given 1.5 L of IV normal saline bolus followed by lactated ringer  at 125 mL/h and 10 mill equivalents IV potassium chloride .  He was started on IV insulin  drip per Endo tool.  He will be admitted to a progressive unit bed for further evaluation and management. PAST MEDICAL HISTORY:   Past Medical History:  Diagnosis Date   Acute  metabolic encephalopathy 12/02/2020   DKA (diabetic ketoacidoses) 04/06/2016   Hypercholesteremia    Hypertension     PAST SURGICAL HISTORY:   Past Surgical History:  Procedure Laterality Date   AMPUTATION TOE Left 04/25/2022   Procedure: LEFT GREAT TOE AMPUTATION AND LEFT PARTIAL 2ND TOE AMPUTATION;  Surgeon: Janit Thresa HERO, DPM;  Location: [facility] ORS;  Service: Podiatry;  Laterality: Left;   COLONOSCOPY WITH PROPOFOL  N/A 02/01/2020   Procedure: COLONOSCOPY WITH PROPOFOL ;  Surgeon: Unk Corinn Skiff, MD;  Location: [facility] ENDOSCOPY;  Service: Gastroenterology;  Laterality: N/A;   ESOPHAGOGASTRODUODENOSCOPY  02/01/2020   Procedure: ESOPHAGOGASTRODUODENOSCOPY (EGD);  Surgeon: Unk Corinn Skiff, MD;  Location: [facility] ENDOSCOPY;  Service: Gastroenterology;;   ESOPHAGOGASTRODUODENOSCOPY (EGD) WITH PROPOFOL  N/A 06/07/2022   Procedure: ESOPHAGOGASTRODUODENOSCOPY (EGD) WITH PROPOFOL ;  Surgeon: Saintclair Jasper, MD;  Location: [facility] ENDOSCOPY;  Service: Gastroenterology;  Laterality: N/A;   KNEE SURGERY Right    Torn meniscus   KNEE SURGERY Left     SOCIAL HISTORY:   Social History   Tobacco Use   Smoking status: Never   Smokeless tobacco: Never  Substance Use Topics   Alcohol use: Not Currently    Alcohol/week: 0.0 - 1.0 standard drinks of alcohol    Comment: once every 2 months    FAMILY HISTORY:   Family History  Problem Relation Age of Onset   Heart attack Father    Hypertension Sister    Cancer Sister  DRUG ALLERGIES:  Allergies[1]  REVIEW OF SYSTEMS:   ROS As per history of present illness. All pertinent systems were reviewed above. Constitutional, HEENT, cardiovascular, respiratory, GI, GU, musculoskeletal, neuro, psychiatric, endocrine, integumentary and hematologic systems were reviewed and are otherwise negative/unremarkable except for positive findings mentioned above in the HPI.   MEDICATIONS AT HOME:   Prior to Admission medications  Medication Sig Start Date End  Date Taking? Authorizing Provider  acetaminophen  (TYLENOL ) 325 MG tablet Take 2 tablets (650 mg total) by mouth every 6 (six) hours as needed for mild pain (pain score 1-3) or fever. 09/17/23  Yes Josette Ade, MD  acyclovir  (ZOVIRAX ) 400 MG tablet Take 1 tablet by mouth twice daily 03/14/24  Yes Rao, Archana C, MD  atorvastatin  (LIPITOR ) 80 MG tablet Take 1 tablet (80 mg total) by mouth at bedtime. Hold while taking Paxlovid  04/08/22  Yes Caleen Qualia, MD  bisacodyl  (DULCOLAX) 5 MG EC tablet Take 5 mg by mouth daily as needed. 09/17/23  Yes [provider]  Fe Fum-Vit C-Vit B12-FA (TRIGELS-F FORTE) CAPS capsule Take 1 capsule by mouth 2 (two) times daily. 11/26/23  Yes Caleen Qualia, MD  gabapentin  (NEURONTIN ) 300 MG capsule Take 1 capsule (300 mg total) by mouth 2 (two) times daily. 09/17/23  Yes Wieting, Richard, MD  Insulin  Infusion Pump Supplies (TANDEM T:SLIM ASFT XC PK10 23) MISC  02/21/24  Yes [provider]  insulin  lispro (HUMALOG) 100 UNIT/ML injection Inject 0-12 Units into the skin 4 (four) times daily -  with meals and at bedtime. Inject as per sliding scale:  If 150-200=    0 units 201-250=       3 units 251-300=       5 units 301-350=      7 units 351-400=      9 units For blood sugar >400, give 10 units and notify MD/NP Patient taking differently: Inject 0-12 Units into the skin 4 (four) times daily -  with meals and at bedtime. Inject as per sliding scale:  If 150-200=    0 units 201-250=       3 units 251-300=       5 units 301-350=      7 units 351-400=      9 units For blood sugar >400, give 10 units and notify MD/NP 10/26/23  Yes [provider]  latanoprost  (XALATAN ) 0.005 % ophthalmic solution Place 1 drop into both eyes at bedtime. 11/02/23  Yes [provider]  levETIRAcetam  (KEPPRA ) 500 MG tablet Take 1 tablet by mouth twice daily 03/13/24  Yes Vaslow, [name] K, MD  midodrine  (PROAMATINE ) 2.5 MG tablet Take 1 tablet (2.5 mg total)  by mouth 3 (three) times daily as needed (For systolic blood pressure less than 100). 11/26/23  Yes Amin, Sumayya, MD  Multiple Vitamin (MULTIVITAMIN WITH MINERALS) TABS tablet Take 1 tablet by mouth daily. 11/27/23  Yes Amin, Sumayya, MD  nystatin (MYCOSTATIN/NYSTOP) powder APPLY TO THE PALM OF THE HAND ONCE DAILY WHERE FUNGAL RASH IS PRESENT 03/22/24  Yes [provider]  Continuous Glucose Sensor (DEXCOM G7 SENSOR) MISC SMARTSIG:Every 10 Days 01/19/24   [provider]  docusate sodium  (COLACE) 100 MG capsule Take 1 capsule (100 mg total) by mouth 2 (two) times daily. Patient not taking: Reported on 04/08/2024 12/29/23   Will Almarie MATSU, MD  mupirocin  ointment (BACTROBAN ) 2 % Apply topically. Patient not taking: Reported on 04/08/2024 02/17/24   [provider]  polyethylene glycol (MIRALAX  / GLYCOLAX )  17 g packet Take 17 g by mouth daily as needed for mild constipation. Patient not taking: Reported on 04/08/2024 12/29/23   Will Almarie MATSU, MD  POMALYST  2 MG capsule TAKE 1 CAPSULE BY MOUTH EVERY DAY. Patient not taking: Reported on 04/08/2024 04/05/24   Melanee Annah BROCKS, MD      VITAL SIGNS:  Blood pressure 124/86, pulse (!) 48, temperature 97.8 F (36.6 C), temperature source Axillary, resp. rate 12, height 5' 10 (1.778 m), weight 69.1 kg, SpO2 100%.  PHYSICAL EXAMINATION:  Physical Exam  GENERAL:  74 y.o.-year-old African-American patient lying in the bed with no acute distress.  EYES: Pupils equal, round, reactive to light and accommodation. No scleral icterus. Extraocular muscles intact.  HEENT: Head atraumatic, normocephalic. Oropharynx and nasopharynx clear.  NECK:  Supple, no jugular venous distention. No thyroid  enlargement, no tenderness.  LUNGS: Normal breath sounds bilaterally, no wheezing, rales,rhonchi or crepitation. No use of accessory muscles of respiration.  CARDIOVASCULAR: Regular rate and rhythm, S1, S2 normal. No murmurs, rubs, or gallops.   ABDOMEN: Soft, nondistended, nontender. Bowel sounds present. No organomegaly or mass.  EXTREMITIES: No pedal edema, cyanosis, or clubbing.  NEUROLOGIC: Cranial nerves II through XII are intact. Muscle strength 5/5 in all extremities. Sensation intact. Gait not checked.  PSYCHIATRIC: The patient is alert and oriented x 2 to place and person but not to time.  Normal affect and good eye contact. SKIN: No obvious rash, lesion, or ulcer.   LABORATORY PANEL:   CBC Recent Labs  Lab 04/08/24 1708  WBC 3.2*  HGB 8.7*  HCT 28.4*  PLT 253   ------------------------------------------------------------------------------------------------------------------  Chemistries  Recent Labs  Lab 04/08/24 1718 04/08/24 1851  NA  --  140  K  --  3.8  CL  --  108  CO2  --  25  GLUCOSE  --  156*  BUN  --  25*  CREATININE  --  1.09  CALCIUM   --  8.1*  MG 1.7  --    ------------------------------------------------------------------------------------------------------------------  Cardiac Enzymes No results for input(s): TROPONINI in the last 168 hours. ------------------------------------------------------------------------------------------------------------------  RADIOLOGY:  CT Head Wo Contrast Result Date: 04/08/2024 CLINICAL DATA:  Acute encephalopathy, hyperglycemia EXAM: CT HEAD WITHOUT CONTRAST TECHNIQUE: Contiguous axial images were obtained from the base of the skull through the vertex without intravenous contrast. RADIATION DOSE REDUCTION: This exam was performed according to the departmental dose-optimization program which includes automated exposure control, adjustment of the mA and/or kV according to patient size and/or use of iterative reconstruction technique. COMPARISON:  01/04/2024 FINDINGS: Brain: No acute infarct or hemorrhage. Stable chronic small-vessel ischemic changes throughout the white matter. Lateral ventricles and midline structures are unremarkable. Benign  calcifications are seen along the falx and tentorium. No acute extra-axial fluid collections. No mass effect. Vascular: No hyperdense vessel or unexpected calcification. Skull: Normal. Negative for fracture or focal lesion. Sinuses/Orbits: No acute finding. Other: None. IMPRESSION: 1. Stable head CT, no acute intracranial process. Electronically Signed   By: Ozell Daring M.D.   On: 04/08/2024 20:23      IMPRESSION AND PLAN:  Assessment and Plan: * Uncontrolled type 1 diabetes mellitus with hyperglycemia, with long-term current use of insulin  (HCC) - The patient has uncontrolled type 1 diabetes mellitus with ketotic not acidotic and is more hyperglycemia. -He will be admitted to a stepdown unit bed. - We will continue the patient on IV insulin  drip per Endo tool. - Will continue hydration with IV lactated ringer . - Will closely monitor  blood glucose levels and follow BMPs.  Acute metabolic encephalopathy - His noncontrasted CT scan revealed no acute intracranial abnormalities. - His mental status has improved with above management  Dyslipidemia - Will continue statin therapy.  Seizure disorder (HCC) - Will continue Keppra .  Peripheral neuropathy - Will continue Neurontin .    DVT prophylaxis: Lovenox . Advanced Care Planning:  Code Status: full code. Family Communication:  The plan of care was discussed in details with the patient (and family). I answered all questions. The patient agreed to proceed with the above mentioned plan. Further management will depend upon hospital course. Disposition Plan: Back to previous home environment Consults called: none. All the records are reviewed and case discussed with ED provider.  Status is: Inpatient  At the time of the admission, it appears that the appropriate admission status for this patient is inpatient.  This is judged to be reasonable and necessary in order to provide the required intensity of service to ensure the patient's  safety given the presenting symptoms, physical exam findings and initial radiographic and laboratory data in the context of comorbid conditions.  The patient requires inpatient status due to high intensity of service, high risk of further deterioration and high frequency of surveillance required.  I certify that at the time of admission, it is my clinical judgment that the patient will require inpatient hospital care extending more than 2 midnights.                            Dispo: The patient is from: Home              Anticipated d/c is to: Home              Patient currently is not medically stable to d/c.              Difficult to place patient: No Authorized and performed by: Madison Peaches, MD Total critical care time:   50     minutes. Due to a high probability of clinically significant, life-threatening deterioration, the patient required my highest level of preparedness to intervene emergently and I personally spent this critical care time directly and personally managing the patient.  This critical care time included obtaining a history, examining the patient, pulse oximetry, ordering and review of studies, arranging urgent treatment with development of management plan, evaluation of patient's response to treatment, frequent reassessment, and discussions with other providers. This critical care time was performed to assess and manage the high probability of imminent, life-threatening deterioration that could result in multiorgan failure.  It was exclusive of separately billable procedures and treating other patients and teaching time.   Madison DELENA Peaches M.D on 04/09/2024 at 3:00 AM  [facility]   From 7 PM-7 AM, contact night-coverage [email or web address]  CC: Primary care physician; Alla Amis, MD       [1]  Allergies Allergen Reactions   Penicillins Other (See Comments)    Childhood allergy -tolerated amoxil  03/2022 Unknown reaction Has patient had a PCN reaction causing  immediate rash, facial/tongue/throat swelling, SOB or lightheadedness with hypotension: No Has patient had a PCN reaction causing severe rash involving mucus membranes or skin necrosis: No Has patient had a PCN reaction that required hospitalization No Has patient had a PCN reaction occurring within the last 10 years: No If all of the above answers are NO, then may proceed with Cephalosporin use.    "

## 2024-04-08 NOTE — ED Notes (Signed)
Bladder scan = 362 ML

## 2024-04-09 ENCOUNTER — Other Ambulatory Visit: Payer: Self-pay | Admitting: Internal Medicine

## 2024-04-09 DIAGNOSIS — E785 Hyperlipidemia, unspecified: Secondary | ICD-10-CM | POA: Diagnosis not present

## 2024-04-09 DIAGNOSIS — G9341 Metabolic encephalopathy: Secondary | ICD-10-CM | POA: Diagnosis not present

## 2024-04-09 DIAGNOSIS — G40909 Epilepsy, unspecified, not intractable, without status epilepticus: Secondary | ICD-10-CM | POA: Diagnosis not present

## 2024-04-09 DIAGNOSIS — G629 Polyneuropathy, unspecified: Secondary | ICD-10-CM

## 2024-04-09 DIAGNOSIS — E1065 Type 1 diabetes mellitus with hyperglycemia: Secondary | ICD-10-CM | POA: Diagnosis not present

## 2024-04-09 DIAGNOSIS — E1100 Type 2 diabetes mellitus with hyperosmolarity without nonketotic hyperglycemic-hyperosmolar coma (NKHHC): Secondary | ICD-10-CM | POA: Diagnosis present

## 2024-04-09 LAB — BETA-HYDROXYBUTYRIC ACID
Beta-Hydroxybutyric Acid: 0.14 mmol/L (ref 0.05–0.27)
Beta-Hydroxybutyric Acid: 0.11 mmol/L (ref 0.05–0.27)
Beta-Hydroxybutyric Acid: 0.99 mmol/L — ABNORMAL HIGH (ref 0.05–0.27)

## 2024-04-09 LAB — BASIC METABOLIC PANEL WITH GFR
Anion gap: 4 — ABNORMAL LOW (ref 5–15)
Anion gap: 8 (ref 5–15)
BUN: 23 mg/dL (ref 8–23)
BUN: 24 mg/dL — ABNORMAL HIGH (ref 8–23)
CO2: 28 mmol/L (ref 22–32)
CO2: 24 mmol/L (ref 22–32)
Calcium: 8.6 mg/dL — ABNORMAL LOW (ref 8.9–10.3)
Calcium: 8.4 mg/dL — ABNORMAL LOW (ref 8.9–10.3)
Chloride: 108 mmol/L (ref 98–111)
Chloride: 107 mmol/L (ref 98–111)
Creatinine, Ser: 0.94 mg/dL (ref 0.61–1.24)
Creatinine, Ser: 0.99 mg/dL (ref 0.61–1.24)
GFR, Estimated: >60 mL/min
GFR, Estimated: >60 mL/min
Glucose, Bld: 105 mg/dL — ABNORMAL HIGH (ref 70–99)
Glucose, Bld: 232 mg/dL — ABNORMAL HIGH (ref 70–99)
Potassium: 3.7 mmol/L (ref 3.5–5.1)
Potassium: 3.7 mmol/L (ref 3.5–5.1)
Sodium: 140 mmol/L (ref 135–145)
Sodium: 139 mmol/L (ref 135–145)

## 2024-04-09 LAB — CBC
HCT: 23.3 % — ABNORMAL LOW (ref 39.0–52.0)
HCT: 25.1 % — ABNORMAL LOW (ref 39.0–52.0)
Hemoglobin: 7.4 g/dL — ABNORMAL LOW (ref 13.0–17.0)
Hemoglobin: 7.9 g/dL — ABNORMAL LOW (ref 13.0–17.0)
MCH: 31.8 pg (ref 26.0–34.0)
MCH: 31.5 pg (ref 26.0–34.0)
MCHC: 31.8 g/dL (ref 30.0–36.0)
MCHC: 31.5 g/dL (ref 30.0–36.0)
MCV: 100 fL (ref 80.0–100.0)
MCV: 100 fL (ref 80.0–100.0)
Platelets: 213 K/uL (ref 150–400)
Platelets: 205 K/uL (ref 150–400)
RBC: 2.33 MIL/uL — ABNORMAL LOW (ref 4.22–5.81)
RBC: 2.51 MIL/uL — ABNORMAL LOW (ref 4.22–5.81)
RDW: 16.8 % — ABNORMAL HIGH (ref 11.5–15.5)
RDW: 16.4 % — ABNORMAL HIGH (ref 11.5–15.5)
WBC: 4 K/uL (ref 4.0–10.5)
WBC: 3.9 K/uL — ABNORMAL LOW (ref 4.0–10.5)
nRBC: 0 % (ref 0.0–0.2)
nRBC: 0 % (ref 0.0–0.2)

## 2024-04-09 LAB — CBG MONITORING, ED
Glucose-Capillary: 150 mg/dL — ABNORMAL HIGH (ref 70–99)
Glucose-Capillary: 147 mg/dL — ABNORMAL HIGH (ref 70–99)
Glucose-Capillary: 97 mg/dL (ref 70–99)

## 2024-04-09 LAB — HEMOGLOBIN A1C
Hgb A1c MFr Bld: 8.4 % — ABNORMAL HIGH (ref 4.8–5.6)
Mean Plasma Glucose: 194.38 mg/dL

## 2024-04-09 NOTE — Discharge Summary (Signed)
 " Physician Discharge Summary   Patient: Nicholas Weiss MRN: [id number] DOB: Aug 11, 1950  Admit date:     04/08/2024  Discharge date: 04/09/2024  Discharge Physician: Carliss LELON Canales   PCP: Alla Amis, MD   Recommendations at discharge:    Pt to be discharged home.   If you experience worsening fever, chills, chest pain, shortness of breath, or other concerning symptoms, please call your PCP or go to the emergency department immediately.  Discharge Diagnoses: Principal Problem:   Uncontrolled type 1 diabetes mellitus with hyperglycemia, with long-term current use of insulin  (HCC) Active Problems:   Acute metabolic encephalopathy   Dyslipidemia   Seizure disorder (HCC)   Peripheral neuropathy  Resolved Problems:   * No resolved hospital problems. [facility] Course:  74 y.o. African-American male with medical history significant for type 1 diabetes mellitus, dyslipidemia, CVA with residual left-sided hemiparesis, chronic atrial fibrillation, coronary artery disease, stage III CKD, multiple myeloma and hypertension, who presented to the emergency room with acute onset of altered mental status with hyperglycemia.  His blood glucose has been reading high over the last 48 hours.  Per EMS it was 476.  The patient denied any nausea or vomiting or diarrhea.  No dysuria, oliguria or hematuria, urinary frequency or urgency or flank pain.  No significant polydipsia.  No chest pain or palpitations.  No cough or wheezing or dyspnea. ED Course: When he came to the ER BP was 148/84 with otherwise normal vital signs.  Labs revealed a blood glucose of 156 with a BUN of 25 and calcium  of 8.1 and VBG showed pH 7.23 with HCO3 of 22.6 and pCO2 of 54.  Anion gap was 8.  CBC showed leukopenia of 3.2 and hemoglobin 8.7 with hematocrit 28.4 better than previous levels with macrocytosis.  MiraLAX .  Was 3.2 and later 0.11 EKG as reviewed by me : EKG showed accelerated junctional rhythm with a rate of 85  with Q waves anteroseptally. Imaging: None.   The patient was given 1.5 L of IV normal saline bolus followed by lactated ringer  at 125 mL/h and 10 mill equivalents IV potassium chloride .  He was started on IV insulin  drip per Endo tool.  He will be admitted to a progressive unit bed for further evaluation and management.  Assessment and Plan:  Uncontrolled type 1 diabetes mellitus concern for early HHS - Hyperglycemia and encephalopathy on presentation.  Initiated on IV insulin  drip.  Aggressive IV fluid hydration.  Showed excellent response with controlled glucose.  Subsequently transition to subcu insulin  regiment.  No acidemia.  Improvement in creatinine.  At this time recommend patient transition back to insulin  pump.  Acute metabolic encephalopathy - Appears to be resolved.  CT head showed no acute findings.  Peripheral neuropathy - Continue Neurontin .  Seizure disorder - Continue Keppra  regimen.  Consultants: None Procedures performed: None Disposition: Home Diet recommendation:  Cardiac and Carb modified diet  DISCHARGE MEDICATION: Allergies as of 04/09/2024       Reactions   Penicillins Other (See Comments)   Childhood allergy -tolerated amoxil  03/2022 Unknown reaction Has patient had a PCN reaction causing immediate rash, facial/tongue/throat swelling, SOB or lightheadedness with hypotension: No Has patient had a PCN reaction causing severe rash involving mucus membranes or skin necrosis: No Has patient had a PCN reaction that required hospitalization No Has patient had a PCN reaction occurring within the last 10 years: No If all of the above answers are NO, then may proceed with Cephalosporin  use.        Medication List     STOP taking these medications    docusate sodium  100 MG capsule Commonly known as: COLACE   midodrine  2.5 MG tablet Commonly known as: PROAMATINE    mupirocin  ointment 2 % Commonly known as: BACTROBAN    polyethylene glycol 17 g  packet Commonly known as: MIRALAX  / GLYCOLAX    Pomalyst  2 MG capsule Generic drug: pomalidomide        TAKE these medications    acetaminophen  325 MG tablet Commonly known as: TYLENOL  Take 2 tablets (650 mg total) by mouth every 6 (six) hours as needed for mild pain (pain score 1-3) or fever.   acyclovir  400 MG tablet Commonly known as: ZOVIRAX  Take 1 tablet by mouth twice daily   atorvastatin  80 MG tablet Commonly known as: LIPITOR  Take 1 tablet (80 mg total) by mouth at bedtime. Hold while taking Paxlovid    bisacodyl  5 MG EC tablet Commonly known as: DULCOLAX Take 5 mg by mouth daily as needed.   Dexcom G7 Sensor Misc SMARTSIG:Every 10 Days   Fe Fum-Vit C-Vit B12-FA Caps capsule Commonly known as: TRIGELS-F FORTE Take 1 capsule by mouth 2 (two) times daily.   gabapentin  300 MG capsule Commonly known as: NEURONTIN  Take 1 capsule (300 mg total) by mouth 2 (two) times daily.   insulin  lispro 100 UNIT/ML injection Commonly known as: HUMALOG Inject 0-12 Units into the skin 4 (four) times daily -  with meals and at bedtime. Inject as per sliding scale:  If 150-200=    0 units 201-250=       3 units 251-300=       5 units 301-350=      7 units 351-400=      9 units For blood sugar >400, give 10 units and notify MD/NP   latanoprost  0.005 % ophthalmic solution Commonly known as: XALATAN  Place 1 drop into both eyes at bedtime.   levETIRAcetam  500 MG tablet Commonly known as: KEPPRA  Take 1 tablet by mouth twice daily   multivitamin with minerals Tabs tablet Take 1 tablet by mouth daily.   nystatin powder Commonly known as: MYCOSTATIN/NYSTOP APPLY TO THE PALM OF THE HAND ONCE DAILY WHERE FUNGAL RASH IS PRESENT   Tandem t:slim ASFT XC PK10 23 Misc         Discharge Exam: Filed Weights   04/08/24 1635  Weight: 69.1 kg    GENERAL:  Alert, pleasant, no acute distress  HEENT:  EOMI CARDIOVASCULAR:  RRR, no murmurs appreciated RESPIRATORY:  Clear to  auscultation, no wheezing, rales, or rhonchi GASTROINTESTINAL:  Soft, nontender, nondistended EXTREMITIES:  No LE edema bilaterally NEURO:  No new focal deficits appreciated SKIN:  No rashes noted PSYCH:  Appropriate mood and affect     Condition at discharge: improving  The results of significant diagnostics from this hospitalization (including imaging, microbiology, ancillary and laboratory) are listed below for reference.   Imaging Studies: CT Head Wo Contrast Result Date: 04/08/2024 CLINICAL DATA:  Acute encephalopathy, hyperglycemia EXAM: CT HEAD WITHOUT CONTRAST TECHNIQUE: Contiguous axial images were obtained from the base of the skull through the vertex without intravenous contrast. RADIATION DOSE REDUCTION: This exam was performed according to the departmental dose-optimization program which includes automated exposure control, adjustment of the mA and/or kV according to patient size and/or use of iterative reconstruction technique. COMPARISON:  01/04/2024 FINDINGS: Brain: No acute infarct or hemorrhage. Stable chronic small-vessel ischemic changes throughout the white matter. Lateral ventricles and midline structures are  unremarkable. Benign calcifications are seen along the falx and tentorium. No acute extra-axial fluid collections. No mass effect. Vascular: No hyperdense vessel or unexpected calcification. Skull: Normal. Negative for fracture or focal lesion. Sinuses/Orbits: No acute finding. Other: None. IMPRESSION: 1. Stable head CT, no acute intracranial process. Electronically Signed   By: Ozell Daring M.D.   On: 04/08/2024 20:23    Microbiology: Results for orders placed or performed during the hospital encounter of 12/24/23  Urine Culture     Status: Abnormal   Collection Time: 12/28/23  6:55 PM   Specimen: Urine, Random  Result Value Ref Range Status   Specimen Description URINE, RANDOM  Final   Special Requests   Final    NONE Reflexed from [id number] Performed at [facility], [address] [place], [place] [address]    Culture >=100,000 COLONIES/mL ENTEROCOCCUS FAECALIS (A)  Final   Report Status 12/30/2023 FINAL  Final   Organism ID, Bacteria ENTEROCOCCUS FAECALIS (A)  Final      Susceptibility   Enterococcus faecalis - MIC*    AMPICILLIN  <=2 SENSITIVE Sensitive     NITROFURANTOIN <=16 SENSITIVE Sensitive     VANCOMYCIN  1 SENSITIVE Sensitive     * >=100,000 COLONIES/mL ENTEROCOCCUS FAECALIS   *Note: Due to a large number of results and/or encounters for the requested time period, some results have not been displayed. A complete set of results can be found in Results Review.    Labs: CBC: Recent Labs  Lab 04/08/24 1708 04/09/24 0524  WBC 3.2* 3.9*  HGB 8.7* 7.9*  HCT 28.4* 25.1*  MCV 100.7* 100.0  PLT 253 205   Basic Metabolic Panel: Recent Labs  Lab 04/08/24 1708 04/08/24 1718 04/08/24 1851 04/09/24 0524  NA 137  --  140 139  K 3.9  --  3.8 3.7  CL 101  --  108 107  CO2 22  --  25 24  GLUCOSE 372*  --  156* 232*  BUN 29*  --  25* 24*  CREATININE 1.36*  --  1.09 0.99  CALCIUM  9.0  --  8.1* 8.4*  MG  --  1.7  --   --    Liver Function Tests: No results for input(s): AST, ALT, ALKPHOS, BILITOT, PROT, ALBUMIN in the last 168 hours. CBG: Recent Labs  Lab 04/08/24 2302 04/08/24 2320 04/09/24 0410 04/09/24 0609 04/09/24 0813  GLUCAP 135* 117* 150* 147* 97    Discharge time spent: 33 minutes.  Length of inpatient stay: 1 days  Signed: Carliss LELON Canales, DO [facility] 04/09/2024         "

## 2024-04-09 NOTE — Assessment & Plan Note (Signed)
 Will continue statin therapy

## 2024-04-09 NOTE — Assessment & Plan Note (Deleted)
-   The patient was admitted to a stepdown unit bed. - We will continue the patient on IV insulin  drip per Endo tool. - Will continue hydration with IV lactated ringer . - Will closely monitor blood glucose levels and follow BMPs.

## 2024-04-09 NOTE — ED Notes (Signed)
 [name] contacted to ensure someone is home when PT arrives. [facility] here for transport, needs met on discharge.

## 2024-04-09 NOTE — Assessment & Plan Note (Signed)
-   Will continue Keppra.

## 2024-04-09 NOTE — Assessment & Plan Note (Signed)
-   The patient has uncontrolled type 1 diabetes mellitus with ketotic not acidotic and is more hyperglycemia. -He will be admitted to a stepdown unit bed. - We will continue the patient on IV insulin  drip per Endo tool. - Will continue hydration with IV lactated ringer . - Will closely monitor blood glucose levels and follow BMPs.

## 2024-04-09 NOTE — Assessment & Plan Note (Signed)
-   The patient has uncontrolled

## 2024-04-09 NOTE — Care Management CC44 (Signed)
"         Condition Code 44 Documentation Completed  Patient Details  Name: Nicholas Weiss MRN: [id number] Date of Birth: 27-May-1950   Condition Code 44 given:  Yes Patient signature on Condition Code 44 notice:   (Reviewed with his son telephonically and declined receipt of the notice.) Documentation of 2 MD's agreement:  Yes Code 44 added to claim:  Yes    Emmerson Taddei L Lorenna Lurry, LCSW 04/09/2024, 12:15 PM  "

## 2024-04-09 NOTE — Assessment & Plan Note (Signed)
-   His noncontrasted CT scan revealed no acute intracranial abnormalities. - His mental status has improved with above management

## 2024-04-09 NOTE — Assessment & Plan Note (Signed)
-   Will continue Neurontin .

## 2024-04-10 ENCOUNTER — Encounter: Payer: Self-pay | Admitting: Oncology

## 2024-04-11 ENCOUNTER — Inpatient Hospital Stay

## 2024-04-11 ENCOUNTER — Inpatient Hospital Stay: Admitting: Oncology

## 2024-04-11 ENCOUNTER — Inpatient Hospital Stay: Attending: Oncology

## 2024-04-11 ENCOUNTER — Encounter: Payer: Self-pay | Admitting: Oncology

## 2024-04-11 VITALS — BP 113/71 | HR 68 | Temp 96.0°F | Resp 18 | Ht 70.0 in | Wt 150.0 lb

## 2024-04-11 DIAGNOSIS — D701 Agranulocytosis secondary to cancer chemotherapy: Secondary | ICD-10-CM | POA: Diagnosis not present

## 2024-04-11 DIAGNOSIS — N183 Chronic kidney disease, stage 3 unspecified: Secondary | ICD-10-CM | POA: Insufficient documentation

## 2024-04-11 DIAGNOSIS — C9000 Multiple myeloma not having achieved remission: Secondary | ICD-10-CM | POA: Insufficient documentation

## 2024-04-11 DIAGNOSIS — D631 Anemia in chronic kidney disease: Secondary | ICD-10-CM | POA: Insufficient documentation

## 2024-04-11 DIAGNOSIS — Z79624 Long term (current) use of inhibitors of nucleotide synthesis: Secondary | ICD-10-CM | POA: Diagnosis not present

## 2024-04-11 DIAGNOSIS — T451X5A Adverse effect of antineoplastic and immunosuppressive drugs, initial encounter: Secondary | ICD-10-CM

## 2024-04-11 DIAGNOSIS — Z79899 Other long term (current) drug therapy: Secondary | ICD-10-CM | POA: Diagnosis not present

## 2024-04-11 DIAGNOSIS — I129 Hypertensive chronic kidney disease with stage 1 through stage 4 chronic kidney disease, or unspecified chronic kidney disease: Secondary | ICD-10-CM | POA: Insufficient documentation

## 2024-04-11 DIAGNOSIS — E1022 Type 1 diabetes mellitus with diabetic chronic kidney disease: Secondary | ICD-10-CM | POA: Diagnosis not present

## 2024-04-11 LAB — CBC WITH DIFFERENTIAL/PLATELET
Abs Immature Granulocytes: 0.01 K/uL (ref 0.00–0.07)
Basophils Absolute: 0 K/uL (ref 0.0–0.1)
Basophils Relative: 1 %
Eosinophils Absolute: 0.1 K/uL (ref 0.0–0.5)
Eosinophils Relative: 4 %
HCT: 24.2 % — ABNORMAL LOW (ref 39.0–52.0)
Hemoglobin: 7.7 g/dL — ABNORMAL LOW (ref 13.0–17.0)
Immature Granulocytes: 0 %
Lymphocytes Relative: 42 %
Lymphs Abs: 1 K/uL (ref 0.7–4.0)
MCH: 32.1 pg (ref 26.0–34.0)
MCHC: 31.8 g/dL (ref 30.0–36.0)
MCV: 100.8 fL — ABNORMAL HIGH (ref 80.0–100.0)
Monocytes Absolute: 0.2 K/uL (ref 0.1–1.0)
Monocytes Relative: 10 %
Neutro Abs: 1 K/uL — ABNORMAL LOW (ref 1.7–7.7)
Neutrophils Relative %: 43 %
Platelets: 241 K/uL (ref 150–400)
RBC: 2.4 MIL/uL — ABNORMAL LOW (ref 4.22–5.81)
RDW: 17.2 % — ABNORMAL HIGH (ref 11.5–15.5)
WBC: 2.3 K/uL — ABNORMAL LOW (ref 4.0–10.5)
nRBC: 0 % (ref 0.0–0.2)

## 2024-04-11 LAB — COMPREHENSIVE METABOLIC PANEL WITH GFR
ALT: 20 U/L (ref 0–44)
AST: 22 U/L (ref 15–41)
Albumin: 3 g/dL — ABNORMAL LOW (ref 3.5–5.0)
Alkaline Phosphatase: 86 U/L (ref 38–126)
Anion gap: 8 (ref 5–15)
BUN: 16 mg/dL (ref 8–23)
CO2: 26 mmol/L (ref 22–32)
Calcium: 8.7 mg/dL — ABNORMAL LOW (ref 8.9–10.3)
Chloride: 106 mmol/L (ref 98–111)
Creatinine, Ser: 0.96 mg/dL (ref 0.61–1.24)
GFR, Estimated: >60 mL/min
Glucose, Bld: 153 mg/dL — ABNORMAL HIGH (ref 70–99)
Potassium: 3.9 mmol/L (ref 3.5–5.1)
Sodium: 139 mmol/L (ref 135–145)
Total Bilirubin: 0.5 mg/dL (ref 0.0–1.2)
Total Protein: 8 g/dL (ref 6.5–8.1)

## 2024-04-11 NOTE — Progress Notes (Signed)
 Patient feeling okay today; no new or acute concerns at this time.

## 2024-04-12 ENCOUNTER — Other Ambulatory Visit: Payer: Self-pay

## 2024-04-12 LAB — KAPPA/LAMBDA LIGHT CHAINS
Kappa free light chain: 5.3 mg/L (ref 3.3–19.4)
Kappa, lambda light chain ratio: 0.03 — ABNORMAL LOW (ref 0.26–1.65)
Lambda free light chains: 199.8 mg/L — ABNORMAL HIGH (ref 5.7–26.3)

## 2024-04-13 LAB — MULTIPLE MYELOMA PANEL, SERUM
Albumin SerPl Elph-Mcnc: 2.9 g/dL (ref 2.9–4.4)
Albumin/Glob SerPl: 0.7 (ref 0.7–1.7)
Alpha 1: 0.2 g/dL (ref 0.0–0.4)
Alpha2 Glob SerPl Elph-Mcnc: 0.6 g/dL (ref 0.4–1.0)
B-Globulin SerPl Elph-Mcnc: 0.6 g/dL — ABNORMAL LOW (ref 0.7–1.3)
Gamma Glob SerPl Elph-Mcnc: 3.2 g/dL — ABNORMAL HIGH (ref 0.4–1.8)
Globulin, Total: 4.6 g/dL — ABNORMAL HIGH (ref 2.2–3.9)
IgA: 23 mg/dL — ABNORMAL LOW (ref 61–437)
IgG (Immunoglobin G), Serum: 4124 mg/dL — ABNORMAL HIGH (ref 603–1613)
IgM (Immunoglobulin M), Srm: <5 mg/dL — ABNORMAL LOW (ref 15–143)
M Protein SerPl Elph-Mcnc: 2.9 g/dL — ABNORMAL HIGH
Total Protein ELP: 7.5 g/dL (ref 6.0–8.5)

## 2024-04-16 ENCOUNTER — Encounter: Payer: Self-pay | Admitting: Oncology

## 2024-04-16 NOTE — Progress Notes (Signed)
 "    Hematology/Oncology Consult note [facility]  [phone] [phone]  Patient Care Team: Alla Amis, MD as PCP - General (Family Medicine) Pa, [facility] (Optometry) Melanee Annah BROCKS, MD as Consulting Physician (Oncology)   Name of the patient: Nicholas Weiss  [id number]  [birth year]   Date of visit: 04/16/24  Diagnosis- 1. high risk IgG lambda multiple myeloma R-ISS stage III  2. Anemia of chronic kidney disease  Chief complaint/ Reason for visit- routine f/u of multiple myeloma  Heme/Onc history: Patient is a 74 year old African-American male with a past medical history significant for uncontrolled type 1 diabetes, chronic kidney disease stage III chronic normocytic anemia referred for abnormal SPEP. Patient's creatinine has been fluctuating between 1.5-2 but about 5 months ago it went up all the way to 5 and his blood sugars were in the 1000 range. More recently his kidney numbers have been drifting back to normal values. As a part of the work-up he had serum protein electrophoresis done which showed an elevated gammaglobulin fraction with an M spike of 3%. The amount of M spike was not quantified in the specimen. He has therefore been referred to us  for further management. Patient endorses chronic fatigue reports that his appetite and weight have remained stable   Results of blood work from 04/08/2020 were as follows: CBC showed white count of 2.9, H&H of 10.5/31.2 with an MCV of 91 and a platelet count of 191.  CMP showed a mildly elevated creatinine of 1.2 which was better as compared to 5 months ago when it was 3.9.  Total protein was mildly elevated at 9.1 calcium  normal at 8.4 ferritin and iron  studies B12 folate TSH and haptoglobin were normal.  Myeloma panel revealed an elevated IgG level of 06/04/2004 with an M protein of 3.1 g.  Immunofixation showed IgG lambda specificity.  Serum free light chain ratio was elevated at 25 and  free light chain lambda elevated at 370   Further myeloma work-up including a PET CT scan did not reveal any evidence of edematous lesions.  Bone marrow biopsy showed 23% plasma cells by manual count and 30% by CD138 IHC.  Normal cytogenetics.  FISH studies for myeloma showed gain of 1 q.  13 q-. detected.  P53 not detected.   He was treated for a year and has having smoldering multiple myeloma since both CKD and anemia have been stable for 3 to 4 years and could be secondary to uncontrolled diabetes.In June 2023 IgG levels increased to 5463 with an M protein of 4.1 g as compared to 2.6 g in September 2022.  Serum free light chain ratio remains around 25.  Therefore a repeat bone marrow biopsy was done which shows further increase in plasma cells ranging from 30 to 70% and by immunohistochemistry 60 to 70% of the cells were positive for CD138.  Cytogenetics normal.  FISH study showed 4: 14 translocation and gain of 1 q. making this high risk RISS stage III.  Repeat PET scan showed no lytic lesions   Patient is not a transplant candidate and was started on Velcade  Revlimid  dexamethasone  regimen in July 2023.  He was also referred to [facility] for second opinion but did not go.  Treatment complicated by repeated hospitalizations for uncontrolled blood sugars.  He has been frequently hospitalized thereby causing multiple interruptions in his treatment. Due to lack of adequate response to Vrd darzalex  was added in may 2024. M protein remained around 2 g despite  adding Darzalex  to the regimen and therefore plan to switch to Pomalyst  plus Darzalex  and stop Velcade  and Revlimid  altogether starting August 2024      Interval history- Nicholas Weiss is a 74 year old male with multiple myeloma and persistent bone marrow suppression who presents for follow-up of ongoing cytopenias.  He last received Darzalex  in early December, approximately six weeks prior to this visit. All myeloma-directed oral therapies have  been held due to persistent bone marrow suppression. Despite cessation of therapy, his cytopenias have persisted without improvement. He is not currently taking any myeloma medications. There are no new or worsening symptoms attributable to myeloma, but bone marrow suppression is ongoing.  He developed a yeast infection of the hand. There have been no recent falls, though he did slip off the bed while sitting on the edge and required assistance to stand, without injury.  He was recently treated in the emergency department for diabetic ketoacidosis, managed with intravenous fluids and potassium, and was discharged home the following day without further complications relevant to his oncologic care.       ECOG PS- 3 Pain scale- 3   Review of systems- Review of Systems  Constitutional:  Positive for malaise/fatigue. Negative for chills, fever and weight loss.  HENT:  Negative for congestion, ear discharge and nosebleeds.   Eyes:  Negative for blurred vision.  Respiratory:  Negative for cough, hemoptysis, sputum production, shortness of breath and wheezing.   Cardiovascular:  Negative for chest pain, palpitations, orthopnea and claudication.  Gastrointestinal:  Negative for abdominal pain, blood in stool, constipation, diarrhea, heartburn, melena, nausea and vomiting.  Genitourinary:  Negative for dysuria, flank pain, frequency, hematuria and urgency.  Musculoskeletal:  Negative for back pain, joint pain and myalgias.  Skin:  Negative for rash.  Neurological:  Negative for dizziness, tingling, focal weakness, seizures, weakness and headaches.  Endo/Heme/Allergies:  Does not bruise/bleed easily.  Psychiatric/Behavioral:  Negative for depression and suicidal ideas. The patient does not have insomnia.       Allergies[1]   Past Medical History:  Diagnosis Date   Acute metabolic encephalopathy 12/02/2020   DKA (diabetic ketoacidoses) 04/06/2016   Hypercholesteremia    Hypertension       Past Surgical History:  Procedure Laterality Date   AMPUTATION TOE Left 04/25/2022   Procedure: LEFT GREAT TOE AMPUTATION AND LEFT PARTIAL 2ND TOE AMPUTATION;  Surgeon: Janit Thresa HERO, DPM;  Location: [facility] ORS;  Service: Podiatry;  Laterality: Left;   COLONOSCOPY WITH PROPOFOL  N/A 02/01/2020   Procedure: COLONOSCOPY WITH PROPOFOL ;  Surgeon: Unk Corinn Skiff, MD;  Location: [facility] ENDOSCOPY;  Service: Gastroenterology;  Laterality: N/A;   ESOPHAGOGASTRODUODENOSCOPY  02/01/2020   Procedure: ESOPHAGOGASTRODUODENOSCOPY (EGD);  Surgeon: Unk Corinn Skiff, MD;  Location: [facility] ENDOSCOPY;  Service: Gastroenterology;;   ESOPHAGOGASTRODUODENOSCOPY (EGD) WITH PROPOFOL  N/A 06/07/2022   Procedure: ESOPHAGOGASTRODUODENOSCOPY (EGD) WITH PROPOFOL ;  Surgeon: Saintclair Jasper, MD;  Location: [facility] ENDOSCOPY;  Service: Gastroenterology;  Laterality: N/A;   KNEE SURGERY Right    Torn meniscus   KNEE SURGERY Left     Social History   Socioeconomic History   Marital status: Single    Spouse name: Not on file   Number of children: 3   Years of education: Not on file   Highest education level: High school graduate  Occupational History    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  Tobacco Use   Smoking status: Never   Smokeless tobacco: Never  Vaping Use   Vaping status: Never Used  Substance  and Sexual Activity   Alcohol use: Not Currently    Alcohol/week: 0.0 - 1.0 standard drinks of alcohol    Comment: once every 2 months   Drug use: Yes    Types: Marijuana, Crack cocaine    Comment: last week   Sexual activity: Not Currently    Birth control/protection: None  Other Topics Concern   Not on file  Social History Narrative   Lives with girlfriend sharon   Social Drivers of Health   Tobacco Use: Low Risk (04/11/2024)   Patient History    Smoking Tobacco Use: Never    Smokeless Tobacco Use: Never    Passive Exposure: Not on file  Recent Concern: Tobacco Use - Medium Risk (03/27/2024)   Received  from [facility]   Patient History    Smoking Tobacco Use: Never    Smokeless Tobacco Use: Never    Passive Exposure: Current  Financial Resource Strain: Low Risk  (10/20/2023)   Received from [facility]   Overall Financial Resource Strain (CARDIA)    Difficulty of Paying Living Expenses: Not hard at all  Food Insecurity: Patient Unable To Answer (12/26/2023)   Epic    Worried About Programme Researcher, Broadcasting/film/video in the Last Year: Patient unable to answer    Ran Out of Food in the Last Year: Patient unable to answer  Transportation Needs: No Transportation Needs (11/16/2023)   Epic    Lack of Transportation (Medical): No    Lack of Transportation (Non-Medical): No  Physical Activity: Not on file  Stress: Not on file  Social Connections: Moderately Isolated (11/16/2023)   Social Connection and Isolation Panel    Frequency of Communication with Friends and Family: More than three times a week    Frequency of Social Gatherings with Friends and Family: More than three times a week    Attends Religious Services: Never    Database Administrator or Organizations: No    Attends Banker Meetings: Never    Marital Status: Living with partner  Intimate Partner Violence: Not At Risk (12/26/2023)   Epic    Fear of Current or Ex-Partner: No    Emotionally Abused: No    Physically Abused: No    Sexually Abused: No  Depression (PHQ2-9): Low Risk (04/11/2024)   Depression (PHQ2-9)    PHQ-2 Score: 0  Alcohol Screen: Not on file  Housing: Unknown (11/16/2023)   Epic    Unable to Pay for Housing in the Last Year: No    Number of Times Moved in the Last Year: Not on file    Homeless in the Last Year: No  Utilities: Not At Risk (11/16/2023)   Epic    Threatened with loss of utilities: No  Health Literacy: Not on file    Family History  Problem Relation Age of Onset   Heart attack Father    Hypertension Sister    Cancer Sister     Current  Medications[2]  Physical exam:  Vitals:   04/11/24 1010  BP: 113/71  Pulse: 68  Resp: 18  Temp: (!) 96 F (35.6 C)  TempSrc: Tympanic  SpO2: 100%  Weight: 150 lb (68 kg)  Height: 5' 10 (1.778 m)   Physical Exam Constitutional:      Comments: Sitting in a wheelchair.  Appears frail and fatigued.  Cardiovascular:     Rate and Rhythm: Normal rate and regular rhythm.     Heart sounds: Normal heart sounds.  Pulmonary:     Effort: Pulmonary effort is normal.     Breath sounds: Normal breath sounds.  Abdominal:     General: Bowel sounds are normal.     Palpations: Abdomen is soft.  Skin:    General: Skin is warm and dry.  Neurological:     Mental Status: He is alert and oriented to person, place, and time.      I have personally reviewed labs listed below:    Latest Ref Rng & Units 04/11/2024    9:37 AM  CMP  Glucose 70 - 99 mg/dL 846   BUN 8 - 23 mg/dL 16   Creatinine 9.38 - 1.24 mg/dL 9.03   Sodium 864 - 854 mmol/L 139   Potassium 3.5 - 5.1 mmol/L 3.9   Chloride 98 - 111 mmol/L 106   CO2 22 - 32 mmol/L 26   Calcium  8.9 - 10.3 mg/dL 8.7   Total Protein 6.5 - 8.1 g/dL 8.0   Total Bilirubin 0.0 - 1.2 mg/dL 0.5   Alkaline Phos 38 - 126 U/L 86   AST 15 - 41 U/L 22   ALT 0 - 44 U/L 20       Latest Ref Rng & Units 04/11/2024    9:37 AM  CBC  WBC 4.0 - 10.5 K/uL 2.3   Hemoglobin 13.0 - 17.0 g/dL 7.7   Hematocrit 60.9 - 52.0 % 24.2   Platelets 150 - 400 K/uL 241    I have personally reviewed Radiology images listed below: No images are attached to the encounter.  CT Head Wo Contrast Result Date: 04/08/2024 CLINICAL DATA:  Acute encephalopathy, hyperglycemia EXAM: CT HEAD WITHOUT CONTRAST TECHNIQUE: Contiguous axial images were obtained from the base of the skull through the vertex without intravenous contrast. RADIATION DOSE REDUCTION: This exam was performed according to the departmental dose-optimization program which includes automated exposure control,  adjustment of the mA and/or kV according to patient size and/or use of iterative reconstruction technique. COMPARISON:  01/04/2024 FINDINGS: Brain: No acute infarct or hemorrhage. Stable chronic small-vessel ischemic changes throughout the white matter. Lateral ventricles and midline structures are unremarkable. Benign calcifications are seen along the falx and tentorium. No acute extra-axial fluid collections. No mass effect. Vascular: No hyperdense vessel or unexpected calcification. Skull: Normal. Negative for fracture or focal lesion. Sinuses/Orbits: No acute finding. Other: None. IMPRESSION: 1. Stable head CT, no acute intracranial process. Electronically Signed   By: Ozell Daring M.D.   On: 04/08/2024 20:23     Assessment and plan- Patient is a 74 y.o. male with history of IgG lambda  multiple myeloma R-ISS stage III currently not in remission here for on treatment assessment prior to next cycle of Darzalex   Assessment and Plan    Multiple myeloma not having achieved remission Active multiple myeloma without remission. Therapy withheld due to persistent bone marrow suppression and risk of complications. - Held all myeloma therapy, including Darzalex  and oral agents, for at least two additional weeks. - Planned reassessment in two weeks to evaluate for bone marrow recovery and potential resumption of therapy. - With his present Darzalex  plus Pomalyst  regimen that has been a gradual increase in his M protein and serum free light chains but given his underlying poor performance status switch to carfilzomib is not being considered at this time  Bone marrow suppression secondary to chemotherapy Persistent bone marrow suppression despite cessation of chemotherapy. Deferred invasive diagnostics to avoid additional burden. - Deferred bone marrow biopsy at this time. -  Planned to monitor blood counts and reassess in two weeks. - Will reconsider bone marrow biopsy if suppression persists.          Visit Diagnosis 1. Multiple myeloma not having achieved remission (HCC)   2. Chemotherapy induced neutropenia      Dr. Annah Skene, MD, MPH [facility] [phone] 04/16/2024 10:16 AM                   [1]  Allergies Allergen Reactions   Penicillins Other (See Comments)    Childhood allergy -tolerated amoxil  03/2022 Unknown reaction Has patient had a PCN reaction causing immediate rash, facial/tongue/throat swelling, SOB or lightheadedness with hypotension: No Has patient had a PCN reaction causing severe rash involving mucus membranes or skin necrosis: No Has patient had a PCN reaction that required hospitalization No Has patient had a PCN reaction occurring within the last 10 years: No If all of the above answers are NO, then may proceed with Cephalosporin use.   [2]  Current Outpatient Medications:    acetaminophen  (TYLENOL ) 325 MG tablet, Take 2 tablets (650 mg total) by mouth every 6 (six) hours as needed for mild pain (pain score 1-3) or fever., Disp: , Rfl:    acyclovir  (ZOVIRAX ) 400 MG tablet, Take 1 tablet by mouth twice daily, Disp: 60 tablet, Rfl: 3   atorvastatin  (LIPITOR ) 80 MG tablet, Take 1 tablet (80 mg total) by mouth at bedtime. Hold while taking Paxlovid , Disp: 30 tablet, Rfl: 2   bisacodyl  (DULCOLAX) 5 MG EC tablet, Take 5 mg by mouth daily as needed., Disp: , Rfl:    Continuous Glucose Sensor (DEXCOM G7 SENSOR) MISC, SMARTSIG:Every 10 Days, Disp: , Rfl:    Fe Fum-Vit C-Vit B12-FA (TRIGELS-F FORTE) CAPS capsule, Take 1 capsule by mouth 2 (two) times daily., Disp: , Rfl:    gabapentin  (NEURONTIN ) 300 MG capsule, Take 1 capsule (300 mg total) by mouth 2 (two) times daily., Disp: 60 capsule, Rfl: 0   Insulin  Infusion Pump Supplies (TANDEM T:SLIM ASFT XC PK10 23) MISC, , Disp: , Rfl:    insulin  lispro (HUMALOG) 100 UNIT/ML injection, Inject 0-12 Units into the skin 4 (four) times daily -  with meals and at bedtime. Inject  as per sliding scale:  If 150-200=    0 units 201-250=       3 units 251-300=       5 units 301-350=      7 units 351-400=      9 units For blood sugar >400, give 10 units and notify MD/NP (Patient taking differently: Inject 0-12 Units into the skin 4 (four) times daily -  with meals and at bedtime. Inject as per sliding scale:  If 150-200=    0 units 201-250=       3 units 251-300=       5 units 301-350=      7 units 351-400=      9 units For blood sugar >400, give 10 units and notify MD/NP), Disp: , Rfl:    latanoprost  (XALATAN ) 0.005 % ophthalmic solution, Place 1 drop into both eyes at bedtime., Disp: , Rfl:    levETIRAcetam  (KEPPRA ) 500 MG tablet, Take 1 tablet by mouth twice daily, Disp: 60 tablet, Rfl: 0   Multiple Vitamin (MULTIVITAMIN WITH MINERALS) TABS tablet, Take 1 tablet by mouth daily., Disp: , Rfl:    nystatin (MYCOSTATIN/NYSTOP) powder, APPLY TO THE PALM OF THE HAND ONCE DAILY WHERE FUNGAL RASH IS  PRESENT, Disp: , Rfl:  No current facility-administered medications for this visit.  Facility-Administered Medications Ordered in Other Visits:    epoetin  alfa-epbx (RETACRIT ) injection 40,000 Units, 40,000 Units, Subcutaneous, Q21 days, Melanee Annah BROCKS, MD, 40,000 Units at 07/09/23 1153  "

## 2024-04-17 ENCOUNTER — Encounter: Admitting: Physician Assistant

## 2024-04-17 DIAGNOSIS — E10621 Type 1 diabetes mellitus with foot ulcer: Secondary | ICD-10-CM | POA: Diagnosis not present

## 2024-04-22 ENCOUNTER — Other Ambulatory Visit: Payer: Self-pay

## 2024-04-22 ENCOUNTER — Emergency Department

## 2024-04-22 ENCOUNTER — Inpatient Hospital Stay
Admission: EM | Admit: 2024-04-22 | Discharge: 2024-04-26 | DRG: 637 | Disposition: A | Discharge status: Home Health | Attending: Internal Medicine | Admitting: Internal Medicine

## 2024-04-22 DIAGNOSIS — E1022 Type 1 diabetes mellitus with diabetic chronic kidney disease: Secondary | ICD-10-CM | POA: Diagnosis present

## 2024-04-22 DIAGNOSIS — Z1152 Encounter for screening for COVID-19: Secondary | ICD-10-CM

## 2024-04-22 DIAGNOSIS — E861 Hypovolemia: Secondary | ICD-10-CM | POA: Diagnosis present

## 2024-04-22 DIAGNOSIS — Z79899 Other long term (current) drug therapy: Secondary | ICD-10-CM

## 2024-04-22 DIAGNOSIS — I69334 Monoplegia of upper limb following cerebral infarction affecting left non-dominant side: Secondary | ICD-10-CM

## 2024-04-22 DIAGNOSIS — G9341 Metabolic encephalopathy: Secondary | ICD-10-CM | POA: Diagnosis present

## 2024-04-22 DIAGNOSIS — E1165 Type 2 diabetes mellitus with hyperglycemia: Secondary | ICD-10-CM | POA: Diagnosis present

## 2024-04-22 DIAGNOSIS — Z9641 Presence of insulin pump (external) (internal): Secondary | ICD-10-CM | POA: Diagnosis present

## 2024-04-22 DIAGNOSIS — Z8579 Personal history of other malignant neoplasms of lymphoid, hematopoietic and related tissues: Secondary | ICD-10-CM

## 2024-04-22 DIAGNOSIS — Z8249 Family history of ischemic heart disease and other diseases of the circulatory system: Secondary | ICD-10-CM

## 2024-04-22 DIAGNOSIS — Z89422 Acquired absence of other left toe(s): Secondary | ICD-10-CM

## 2024-04-22 DIAGNOSIS — Z794 Long term (current) use of insulin: Secondary | ICD-10-CM

## 2024-04-22 DIAGNOSIS — E1065 Type 1 diabetes mellitus with hyperglycemia: Principal | ICD-10-CM | POA: Diagnosis present

## 2024-04-22 DIAGNOSIS — R739 Hyperglycemia, unspecified: Secondary | ICD-10-CM | POA: Diagnosis not present

## 2024-04-22 DIAGNOSIS — I959 Hypotension, unspecified: Secondary | ICD-10-CM | POA: Diagnosis present

## 2024-04-22 DIAGNOSIS — E11649 Type 2 diabetes mellitus with hypoglycemia without coma: Secondary | ICD-10-CM

## 2024-04-22 DIAGNOSIS — E7800 Pure hypercholesterolemia, unspecified: Secondary | ICD-10-CM | POA: Diagnosis present

## 2024-04-22 DIAGNOSIS — N1831 Chronic kidney disease, stage 3a: Secondary | ICD-10-CM | POA: Diagnosis present

## 2024-04-22 DIAGNOSIS — I129 Hypertensive chronic kidney disease with stage 1 through stage 4 chronic kidney disease, or unspecified chronic kidney disease: Secondary | ICD-10-CM | POA: Diagnosis present

## 2024-04-22 DIAGNOSIS — G629 Polyneuropathy, unspecified: Secondary | ICD-10-CM | POA: Diagnosis present

## 2024-04-22 DIAGNOSIS — E1069 Type 1 diabetes mellitus with other specified complication: Secondary | ICD-10-CM | POA: Diagnosis present

## 2024-04-22 DIAGNOSIS — E860 Dehydration: Secondary | ICD-10-CM | POA: Diagnosis present

## 2024-04-22 DIAGNOSIS — D631 Anemia in chronic kidney disease: Secondary | ICD-10-CM | POA: Diagnosis present

## 2024-04-22 DIAGNOSIS — R531 Weakness: Secondary | ICD-10-CM

## 2024-04-22 DIAGNOSIS — G40909 Epilepsy, unspecified, not intractable, without status epilepticus: Secondary | ICD-10-CM | POA: Diagnosis present

## 2024-04-22 LAB — CBC WITH DIFFERENTIAL/PLATELET
Abs Immature Granulocytes: 0.02 10*3/uL (ref 0.00–0.07)
Basophils Absolute: 0 10*3/uL (ref 0.0–0.1)
Basophils Relative: 1 %
Eosinophils Absolute: 0.1 10*3/uL (ref 0.0–0.5)
Eosinophils Relative: 2 %
HCT: 28.9 % — ABNORMAL LOW (ref 39.0–52.0)
Hemoglobin: 8.8 g/dL — ABNORMAL LOW (ref 13.0–17.0)
Immature Granulocytes: 1 %
Lymphocytes Relative: 30 %
Lymphs Abs: 1.2 10*3/uL (ref 0.7–4.0)
MCH: 31.1 pg (ref 26.0–34.0)
MCHC: 30.4 g/dL (ref 30.0–36.0)
MCV: 102.1 fL — ABNORMAL HIGH (ref 80.0–100.0)
Monocytes Absolute: 0.2 10*3/uL (ref 0.1–1.0)
Monocytes Relative: 4 %
Neutro Abs: 2.6 10*3/uL (ref 1.7–7.7)
Neutrophils Relative %: 62 %
Platelets: 290 10*3/uL (ref 150–400)
RBC: 2.83 MIL/uL — ABNORMAL LOW (ref 4.22–5.81)
RDW: 16.8 % — ABNORMAL HIGH (ref 11.5–15.5)
WBC: 4.1 10*3/uL (ref 4.0–10.5)
nRBC: 0 % (ref 0.0–0.2)

## 2024-04-22 LAB — COMPREHENSIVE METABOLIC PANEL WITH GFR
ALT: 22 U/L (ref 0–44)
AST: 22 U/L (ref 15–41)
Albumin: 3.2 g/dL — ABNORMAL LOW (ref 3.5–5.0)
Alkaline Phosphatase: 90 U/L (ref 38–126)
Anion gap: 11 (ref 5–15)
BUN: 22 mg/dL (ref 8–23)
CO2: 24 mmol/L (ref 22–32)
Calcium: 9.1 mg/dL (ref 8.9–10.3)
Chloride: 101 mmol/L (ref 98–111)
Creatinine, Ser: 1.23 mg/dL (ref 0.61–1.24)
GFR, Estimated: >60 mL/min
Glucose, Bld: 438 mg/dL — ABNORMAL HIGH (ref 70–99)
Potassium: 5.4 mmol/L — ABNORMAL HIGH (ref 3.5–5.1)
Sodium: 136 mmol/L (ref 135–145)
Total Bilirubin: 0.6 mg/dL (ref 0.0–1.2)
Total Protein: 8.9 g/dL — ABNORMAL HIGH (ref 6.5–8.1)

## 2024-04-22 LAB — RESP PANEL BY RT-PCR (RSV, FLU A&B, COVID)  RVPGX2
Influenza A by PCR: NEGATIVE
Influenza B by PCR: NEGATIVE
Resp Syncytial Virus by PCR: NEGATIVE
SARS Coronavirus 2 by RT PCR: NEGATIVE

## 2024-04-22 LAB — URINALYSIS, ROUTINE W REFLEX MICROSCOPIC
Bilirubin Urine: NEGATIVE
Glucose, UA: >=500 mg/dL — AB
Ketones, ur: 5 mg/dL — AB
Nitrite: NEGATIVE
Protein, ur: NEGATIVE mg/dL
Specific Gravity, Urine: 1.015 (ref 1.005–1.030)
pH: 5 (ref 5.0–8.0)

## 2024-04-22 LAB — BLOOD GAS, VENOUS
Acid-base deficit: 2.1 mmol/L — ABNORMAL HIGH (ref 0.0–2.0)
Bicarbonate: 24.7 mmol/L (ref 20.0–28.0)
O2 Saturation: 32.2 %
Patient temperature: 37
pCO2, Ven: 49 mmHg (ref 44–60)
pH, Ven: 7.31 (ref 7.25–7.43)

## 2024-04-22 LAB — GLUCOSE, CAPILLARY
Glucose-Capillary: 431 mg/dL — ABNORMAL HIGH (ref 70–99)
Glucose-Capillary: 251 mg/dL — ABNORMAL HIGH (ref 70–99)

## 2024-04-22 LAB — TSH: TSH: 3.92 u[IU]/mL (ref 0.350–4.500)

## 2024-04-22 LAB — PROTIME-INR
INR: 1.1 (ref 0.8–1.2)
Prothrombin Time: 14.4 s (ref 11.4–15.2)

## 2024-04-22 LAB — LACTIC ACID, PLASMA
Lactic Acid, Venous: 2 mmol/L (ref 0.5–1.9)
Lactic Acid, Venous: 1.8 mmol/L (ref 0.5–1.9)

## 2024-04-22 LAB — CBG MONITORING, ED: Glucose-Capillary: 372 mg/dL — ABNORMAL HIGH (ref 70–99)

## 2024-04-22 LAB — OSMOLALITY, URINE: Osmolality, Ur: 522 mosm/kg (ref 300–900)

## 2024-04-22 LAB — OSMOLALITY: Osmolality: 324 mosm/kg (ref 275–295)

## 2024-04-22 MED ORDER — ACETAMINOPHEN 325 MG PO TABS
650.0000 mg | ORAL_TABLET | Freq: Four times a day (QID) | ORAL | Status: DC | PRN
  Administered 2024-04-25: 650 mg via ORAL
  Filled 2024-04-22: qty 2

## 2024-04-22 MED ORDER — ENOXAPARIN SODIUM 40 MG/0.4ML IJ SOSY
40.0000 mg | PREFILLED_SYRINGE | INTRAMUSCULAR | Status: DC
  Administered 2024-04-22 – 2024-04-25 (×4): 40 mg via SUBCUTANEOUS
  Filled 2024-04-22 (×4): qty 0.4

## 2024-04-22 MED ORDER — SODIUM CHLORIDE 0.9 % IV BOLUS
1000.0000 mL | Freq: Once | INTRAVENOUS | Status: AC
  Administered 2024-04-22: 1000 mL via INTRAVENOUS

## 2024-04-22 MED ORDER — METOCLOPRAMIDE HCL 5 MG/ML IJ SOLN: 5.0000 mg | Freq: Four times a day (QID) | INTRAMUSCULAR | Status: DC | PRN

## 2024-04-22 MED ORDER — TRAZODONE HCL 50 MG PO TABS: 25.0000 mg | ORAL_TABLET | Freq: Every evening | ORAL | Status: DC | PRN

## 2024-04-22 MED ORDER — LEVETIRACETAM 500 MG PO TABS
500.0000 mg | ORAL_TABLET | Freq: Two times a day (BID) | ORAL | Status: DC
  Administered 2024-04-22 – 2024-04-26 (×8): 500 mg via ORAL
  Filled 2024-04-22 (×8): qty 1

## 2024-04-22 MED ORDER — LATANOPROST 0.005 % OP SOLN
1.0000 [drp] | Freq: Every day | OPHTHALMIC | Status: DC
  Administered 2024-04-22 – 2024-04-25 (×4): 1 [drp] via OPHTHALMIC
  Filled 2024-04-22: qty 2.5

## 2024-04-22 MED ORDER — SODIUM ZIRCONIUM CYCLOSILICATE 10 G PO PACK
10.0000 g | PACK | Freq: Once | ORAL | Status: AC
  Administered 2024-04-22: 10 g via ORAL
  Filled 2024-04-22 (×2): qty 1

## 2024-04-22 MED ORDER — GABAPENTIN 300 MG PO CAPS: 300.0000 mg | ORAL_CAPSULE | Freq: Two times a day (BID) | ORAL | Status: DC

## 2024-04-22 MED ORDER — ACETAMINOPHEN 650 MG RE SUPP: 650.0000 mg | Freq: Four times a day (QID) | RECTAL | Status: DC | PRN

## 2024-04-22 MED ORDER — INSULIN GLARGINE 100 UNIT/ML ~~LOC~~ SOLN
10.0000 [IU] | Freq: Every day | SUBCUTANEOUS | Status: DC
  Administered 2024-04-22: 10 [IU] via SUBCUTANEOUS
  Filled 2024-04-22 (×2): qty 0.1

## 2024-04-22 MED ORDER — SENNOSIDES-DOCUSATE SODIUM 8.6-50 MG PO TABS: 1.0000 | ORAL_TABLET | Freq: Every evening | ORAL | Status: DC | PRN

## 2024-04-22 MED ORDER — SODIUM CHLORIDE 0.9 % IV SOLN: INTRAVENOUS | Status: DC

## 2024-04-22 MED ORDER — ATORVASTATIN CALCIUM 20 MG PO TABS
80.0000 mg | ORAL_TABLET | Freq: Every day | ORAL | Status: DC
  Administered 2024-04-22 – 2024-04-25 (×4): 80 mg via ORAL
  Filled 2024-04-22 (×4): qty 4

## 2024-04-22 MED ORDER — INSULIN ASPART 100 UNIT/ML IJ SOLN
0.0000 [IU] | Freq: Every day | INTRAMUSCULAR | Status: DC
  Administered 2024-04-22: 3 [IU] via SUBCUTANEOUS
  Filled 2024-04-22: qty 3

## 2024-04-22 MED ORDER — INSULIN ASPART 100 UNIT/ML IJ SOLN
12.0000 [IU] | Freq: Once | INTRAMUSCULAR | Status: AC
  Administered 2024-04-22: 12 [IU] via SUBCUTANEOUS
  Filled 2024-04-22: qty 12

## 2024-04-22 MED ORDER — BISACODYL 5 MG PO TBEC
5.0000 mg | DELAYED_RELEASE_TABLET | Freq: Every day | ORAL | Status: DC | PRN
  Administered 2024-04-23: 5 mg via ORAL
  Filled 2024-04-22: qty 1

## 2024-04-22 MED ORDER — INSULIN ASPART 100 UNIT/ML IJ SOLN
0.0000 [IU] | Freq: Three times a day (TID) | INTRAMUSCULAR | Status: DC
  Administered 2024-04-23: 3 [IU] via SUBCUTANEOUS
  Filled 2024-04-22: qty 3

## 2024-04-22 NOTE — ED Provider Notes (Signed)
 "  [facility] Provider Note    Event Date/Time   First MD Initiated Contact with Patient 04/22/24 1055     (approximate)  History   Chief Complaint: Hyperglycemia  HPI  Nicholas Weiss is a 74 y.o. male with a past medical history of diabetes, hypertension, hyperlipidemia, presents emergency department for unresponsiveness.  According to EMS patient is coming from home and family checked on him this morning and he was not responding to the family.  EMS states when they got to the patient he was awake but very slow to respond.  They were obtaining CBG readings of high and transferred the patient to the emergency department.  Here the patient appears weak, he is hypotensive 75/61.  Patient is denying any pain but his responses are very slow.  Patient appears dehydrated dry mucous membranes.  Awaiting family for further history.  Physical Exam   Triage Vital Signs: ED Triage Vitals  Encounter Vitals Group     BP 04/22/24 1059 (!) 75/61     Girls Systolic BP Percentile --      Girls Diastolic BP Percentile --      Boys Systolic BP Percentile --      Boys Diastolic BP Percentile --      Pulse Rate 04/22/24 1057 92     Resp 04/22/24 1057 18     Temp 04/22/24 1059 97.7 F (36.5 C)     Temp src --      SpO2 04/22/24 1057 99 %     Weight 04/22/24 1102 149 lb 14.6 oz (68 kg)     Height 04/22/24 1102 5' 10 (1.778 m)     Head Circumference --      Peak Flow --      Pain Score 04/22/24 1057 0     Pain Loc --      Pain Education --      Exclude from Growth Chart --     Most recent vital signs: Vitals:   04/22/24 1057 04/22/24 1059  BP:  (!) 75/61  Pulse: 92   Resp: 18   Temp:  97.7 F (36.5 C)  SpO2: 99%     General: Awake, no distress.  Appears weak.  Dry mucous membranes. CV:  Good peripheral perfusion.  Regular rate and rhythm  Resp:  Normal effort.  Equal breath sounds bilaterally.  Abd:  No distention.  Soft, nontender.  ED Results /  Procedures / Treatments   EKG  EKG viewed and interpreted by myself shows what appears to be a sinus rhythm at 92 bpm with a narrow QRS, normal axis, normal intervals, nonspecific ST changes without ST elevation.  RADIOLOGY  I have reviewed interpret the chest x-ray images.  No consolidation on my evaluation. Radiology has read the x-ray as some left basilar atelectasis versus scarring.  As well as some pulmonary nodules.   MEDICATIONS ORDERED IN ED: Medications  sodium chloride  0.9 % bolus 1,000 mL (has no administration in time range)     IMPRESSION / MDM / ASSESSMENT AND PLAN / ED COURSE  I reviewed the triage vital signs and the nursing notes.  Patient's presentation is most consistent with acute presentation with potential threat to life or bodily function.  Patient presents to the emergency department for an episode of unresponsiveness this morning.  Here the patient appears weak.  Dry mucous membranes.  I used an ultrasound to obtain a right AC 20-gauge IV.  Patient's veins collapsed very easily  under ultrasound pressure highly suspect hypovolemia.  Blood sugar 372 concern for possible DKA.  We will check labs we will obtain a VBG in addition to blood cultures as a precaution we will IV hydrate while awaiting further results.  Differential is quite broad at this time but would include hyperglycemia, metabolic or electrolyte abnormality, dehydration, renal insufficiency infectious etiology among others.  Patient's workup shows reassuring CBC chronic anemia.  Patient's chemistry shows significant hyperglycemia 438 with a normal anion gap borderline hyperkalemic at 5.4.  Lactic acid elevated to 2.0.  Patient's VBG shows a pH of 7.31 urinalysis shows no significant finding we will send a urine culture.  Given the patient's symptoms hyperglycemia initial hypovolemia hypotension I believe the patient would benefit from admission to the hospital service for further workup and treatment.   Suspect that the patient has become dehydrated.  I spoke to the patient's family member who is now here who states that the pump had fallen off the patient she is not sure for how long she put it back on but the blood sugar was not coming down.  I suspect that the blood sugar could have been more significantly elevated earlier leading to dehydration and initial hypotension.  Blood pressures responded very well to fluids currently 116/70 1:05 liter of IV fluids.  Will admit to the hospital service for further workup and treatment.  FINAL CLINICAL IMPRESSION(S) / ED DIAGNOSES   Weakness Hypovolemia Hypotension Hyperglycemia   Note:  This document was prepared using Dragon voice recognition software and may include unintentional dictation errors.   Dorothyann Drivers, MD 04/22/24 1304  "

## 2024-04-22 NOTE — ED Notes (Signed)
 Patient was changed. Patient had Medium Void prior to 2 RN's changing patients.

## 2024-04-22 NOTE — H&P (Signed)
 " History and Physical    Nicholas Weiss FMW:[id number] DOB: [birth year] DOA: 04/22/2024  PCP: Alla Amis, MD (Confirm with patient/family/NH records and if not entered, this has to be entered at [facility] point of entry) Patient coming from: Home  I have personally briefly reviewed patient's old medical records in [facility]  Chief Complaint: Glucose has been high  HPI: Nicholas Weiss is a 74 y.o. male with medical history significant of IDDM on insulin  pump, HTN, HLD, brought in by family member for evaluation of persistent hyperglycemia and altered mentations.  Patient was hospitalized about 2 weeks ago for similar presentation with hyperglycemia.  Daughter at bedside reported that patient wears an insulin  pump, the pump setting has a ceiling of total of 14 units in 24 hours, and for hypoglycemia event, the pump will automatically give additional bolus of insulin  according to the reading, this time, family has seen  high glucose alarm 3-5 times since 3 days ago.  This morning, patient's insulin  pump again showed high readings of more than 600, and patient started complaining about feeling nausea and became lethargic.  Denies any cough no urinary symptoms no diarrhea.  ED Course: Afebrile, nontachycardic blood pressure 106/72/100% on room air.  Blood work showed glucose 438 BUN 22 creatinine 1.2 VBG 7.30 1/49/32, WBC 4.1, UA 2+ WBC 1+ RBC.  Patient was given 1 L IV bolus.  Review of Systems: As per HPI otherwise 14 point review of systems negative.    Past Medical History:  Diagnosis Date   Acute metabolic encephalopathy 12/02/2020   DKA (diabetic ketoacidoses) 04/06/2016   Hypercholesteremia    Hypertension     Past Surgical History:  Procedure Laterality Date   AMPUTATION TOE Left 04/25/2022   Procedure: LEFT GREAT TOE AMPUTATION AND LEFT PARTIAL 2ND TOE AMPUTATION;  Surgeon: Janit Thresa HERO, DPM;  Location: [facility] ORS;  Service: Podiatry;  Laterality: Left;   COLONOSCOPY  WITH PROPOFOL  N/A 02/01/2020   Procedure: COLONOSCOPY WITH PROPOFOL ;  Surgeon: Unk Corinn Skiff, MD;  Location: [facility] ENDOSCOPY;  Service: Gastroenterology;  Laterality: N/A;   ESOPHAGOGASTRODUODENOSCOPY  02/01/2020   Procedure: ESOPHAGOGASTRODUODENOSCOPY (EGD);  Surgeon: Unk Corinn Skiff, MD;  Location: [facility] ENDOSCOPY;  Service: Gastroenterology;;   ESOPHAGOGASTRODUODENOSCOPY (EGD) WITH PROPOFOL  N/A 06/07/2022   Procedure: ESOPHAGOGASTRODUODENOSCOPY (EGD) WITH PROPOFOL ;  Surgeon: Saintclair Jasper, MD;  Location: [facility] ENDOSCOPY;  Service: Gastroenterology;  Laterality: N/A;   KNEE SURGERY Right    Torn meniscus   KNEE SURGERY Left      reports that he has never smoked. He has never used smokeless tobacco. He reports that he does not currently use alcohol. He reports current drug use. Drugs: Marijuana and Crack cocaine.  Allergies[1]  Family History  Problem Relation Age of Onset   Heart attack Father    Hypertension Sister    Cancer Sister      Prior to Admission medications  Medication Sig Start Date End Date Taking? Authorizing Provider  acetaminophen  (TYLENOL ) 325 MG tablet Take 2 tablets (650 mg total) by mouth every 6 (six) hours as needed for mild pain (pain score 1-3) or fever. 09/17/23   Josette Ade, MD  acyclovir  (ZOVIRAX ) 400 MG tablet Take 1 tablet by mouth twice daily 03/14/24   Rao, Archana C, MD  atorvastatin  (LIPITOR ) 80 MG tablet Take 1 tablet (80 mg total) by mouth at bedtime. Hold while taking Paxlovid  04/08/22   Caleen Qualia, MD  bisacodyl  (DULCOLAX) 5 MG EC tablet Take 5 mg by mouth daily as  needed. 09/17/23   [provider]  Continuous Glucose Sensor (DEXCOM G7 SENSOR) MISC SMARTSIG:Every 10 Days 01/19/24   [provider]  Fe Fum-Vit C-Vit B12-FA (TRIGELS-F FORTE) CAPS capsule Take 1 capsule by mouth 2 (two) times daily. 11/26/23   Caleen Qualia, MD  gabapentin  (NEURONTIN ) 300 MG capsule Take 1 capsule (300 mg total) by mouth 2 (two) times daily.  09/17/23   Josette Ade, MD  Insulin  Infusion Pump Supplies (TANDEM T:SLIM ASFT XC PK10 23) MISC  02/21/24   [provider]  insulin  lispro (HUMALOG) 100 UNIT/ML injection Inject 0-12 Units into the skin 4 (four) times daily -  with meals and at bedtime. Inject as per sliding scale:  If 150-200=    0 units 201-250=       3 units 251-300=       5 units 301-350=      7 units 351-400=      9 units For blood sugar >400, give 10 units and notify MD/NP Patient taking differently: Inject 0-12 Units into the skin 4 (four) times daily -  with meals and at bedtime. Inject as per sliding scale:  If 150-200=    0 units 201-250=       3 units 251-300=       5 units 301-350=      7 units 351-400=      9 units For blood sugar >400, give 10 units and notify MD/NP 10/26/23   [provider]  latanoprost  (XALATAN ) 0.005 % ophthalmic solution Place 1 drop into both eyes at bedtime. 11/02/23   [provider]  levETIRAcetam  (KEPPRA ) 500 MG tablet Take 1 tablet by mouth twice daily 04/10/24   Vaslow, [name] K, MD  Multiple Vitamin (MULTIVITAMIN WITH MINERALS) TABS tablet Take 1 tablet by mouth daily. 11/27/23   Amin, Sumayya, MD  nystatin (MYCOSTATIN/NYSTOP) powder APPLY TO THE PALM OF THE HAND ONCE DAILY WHERE FUNGAL RASH IS PRESENT 03/22/24   [provider]    Physical Exam: Vitals:   04/22/24 1200 04/22/24 1215 04/22/24 1245 04/22/24 1315  BP: 130/73 116/75 117/76 122/80  Pulse: 86 89 90 88  Resp: 11 10 12 15   Temp:      SpO2:    100%  Weight:      Height:        Constitutional: NAD, calm, comfortable Vitals:   04/22/24 1200 04/22/24 1215 04/22/24 1245 04/22/24 1315  BP: 130/73 116/75 117/76 122/80  Pulse: 86 89 90 88  Resp: 11 10 12 15   Temp:      SpO2:    100%  Weight:      Height:       Eyes: PERRL, lids and conjunctivae normal ENMT: Mucous membranes are dry Posterior pharynx clear of any exudate or lesions.Normal dentition.  Neck: normal, supple,  no masses, no thyromegaly Respiratory: clear to auscultation bilaterally, no wheezing, no crackles. Normal respiratory effort. No accessory muscle use.  Cardiovascular: Regular rate and rhythm, no murmurs / rubs / gallops. No extremity edema. 2+ pedal pulses. No carotid bruits.  Abdomen: no tenderness, no masses palpated. No hepatosplenomegaly. Bowel sounds positive.  Musculoskeletal: no clubbing / cyanosis. No joint deformity upper and lower extremities. Good ROM, no contractures. Normal muscle tone.  Skin: no rashes, lesions, ulcers. No induration Neurologic: CN 2-12 grossly intact. Sensation intact, DTR normal. Strength 5/5 in all 4.  Psychiatric: Normal judgment and insight. Alert and oriented x 3. Normal mood.  Somewhat slow in responding to  questions    Labs on Admission: I have personally reviewed following labs and imaging studies  CBC: Recent Labs  Lab 04/22/24 1118  WBC 4.1  NEUTROABS 2.6  HGB 8.8*  HCT 28.9*  MCV 102.1*  PLT 290   Basic Metabolic Panel: Recent Labs  Lab 04/22/24 1118  NA 136  K 5.4*  CL 101  CO2 24  GLUCOSE 438*  BUN 22  CREATININE 1.23  CALCIUM  9.1   GFR: Estimated Creatinine Clearance: 53 mL/min (by C-G formula based on SCr of 1.23 mg/dL). Liver Function Tests: Recent Labs  Lab 04/22/24 1118  AST 22  ALT 22  ALKPHOS 90  BILITOT 0.6  PROT 8.9*  ALBUMIN 3.2*   No results for input(s): LIPASE, AMYLASE in the last 168 hours. No results for input(s): AMMONIA  in the last 168 hours. Coagulation Profile: Recent Labs  Lab 04/22/24 1118  INR 1.1   Cardiac Enzymes: No results for input(s): CKTOTAL, CKMB, CKMBINDEX, TROPONINI in the last 168 hours. BNP (last 3 results) No results for input(s): PROBNP in the last 8760 hours. HbA1C: No results for input(s): HGBA1C in the last 72 hours. CBG: Recent Labs  Lab 04/22/24 1058  GLUCAP 372*   Lipid Profile: No results for input(s): CHOL, HDL, LDLCALC, TRIG,  CHOLHDL, LDLDIRECT in the last 72 hours. Thyroid  Function Tests: No results for input(s): TSH, T4TOTAL, FREET4, T3FREE, THYROIDAB in the last 72 hours. Anemia Panel: No results for input(s): VITAMINB12, FOLATE, FERRITIN, TIBC, IRON , RETICCTPCT in the last 72 hours. Urine analysis:    Component Value Date/Time   COLORURINE YELLOW (A) 04/22/2024 1124   APPEARANCEUR HAZY (A) 04/22/2024 1124   LABSPEC 1.015 04/22/2024 1124   PHURINE 5.0 04/22/2024 1124   GLUCOSEU >=500 (A) 04/22/2024 1124   HGBUR MODERATE (A) 04/22/2024 1124   BILIRUBINUR NEGATIVE 04/22/2024 1124   KETONESUR 5 (A) 04/22/2024 1124   PROTEINUR NEGATIVE 04/22/2024 1124   NITRITE NEGATIVE 04/22/2024 1124   LEUKOCYTESUR MODERATE (A) 04/22/2024 1124    Radiological Exams on Admission: DG Chest Port 1 View Result Date: 04/22/2024 EXAM: 1 VIEW(S) XRAY OF THE CHEST 04/22/2024 11:37:00 AM COMPARISON: 01/04/2024 CLINICAL HISTORY: Questionable sepsis. FINDINGS: LUNGS AND PLEURA: Scarring and/or atelectasis at left lung base. Vague nodularity in the lungs, some of this at the lung bases may be due to nipple shadows but I cannot exclude true pulmonary nodules, dedicated chest CT is recommended. No pleural effusion. No pneumothorax. HEART AND MEDIASTINUM: Atherosclerotic calcifications. No acute abnormality of the cardiac and mediastinal silhouettes. BONES AND SOFT TISSUES: Stable of proximal humeral deformity in the metaphysis. Old bilateral rib deformity. No acute osseous abnormality. IMPRESSION: 1. Left basilar scarring and/or atelectasis. 2. Vague pulmonary nodularity, dedicated chest CT recommended to assess for pulmonary nodules. 3. Atherosclerotic calcifications. 4. Chronic proximal humeral metaphyseal deformity and chronic bilateral rib deformities. Electronically signed by: Ryan Salvage MD 04/22/2024 12:17 PM EST RP Workstation: [id number]    EKG: Independently reviewed.  Sinus rhythm, probably  first-degree AV block.  Assessment/Plan Principal Problem:   Hyperglycemia Active Problems:   Acute metabolic encephalopathy  (please populate well all problems here in Problem List. (For example, if patient is on BP meds at home and you resume or decide to hold them, it is a problem that needs to be her. Same for CAD, COPD, HLD and so on)  Hyperglycemia IDDM - With probably impending DKA versus impending HHS - IV hydration - Family has taken off patient's insulin  pump, will start patient on Lantus  10  units daily and SSI.  Unclear why patient has a upper limit of 14 units insulin  daily, appears that he might need more than 14 units.  Daughter showed me the saved record and the insulin  pump, appears that only at night from 9 PM to 7 AM patient's glucose level fall into the range of below 200, otherwise daytime records show glucose level consistently to be around 400, in addition, most recent A1c 8.5 implying some more on sufficient insulin  treatment.  Family however does not know why there is a limitation of 14 unit ceiling. - Will also consult diabetic coordinator -TSH sent  Acute metabolic encephalopathy - Likely secondary to hyperglycemia - Manage glucose levels above then reevaluate.  Seizure disorder - Clinical presentation does not appear to fit breakthrough seizure - Continue home dose of Keppra   Peripheral neuropathy - Continue Neurontin   DVT prophylaxis: Lovenox  Code Status: Full code Family Communication: Daughter at bedside Disposition Plan: Expect less than 2 midnight hospital stay Consults called: None Admission status: Telemetry observation   Cort ONEIDA Mana MD [facility] Pager [phone]  04/22/2024, 1:29 PM       [1]  Allergies Allergen Reactions   Penicillins Other (See Comments)    Childhood allergy -tolerated amoxil  03/2022 Unknown reaction Has patient had a PCN reaction causing immediate rash, facial/tongue/throat swelling, SOB or lightheadedness with  hypotension: No Has patient had a PCN reaction causing severe rash involving mucus membranes or skin necrosis: No Has patient had a PCN reaction that required hospitalization No Has patient had a PCN reaction occurring within the last 10 years: No If all of the above answers are NO, then may proceed with Cephalosporin use.    "

## 2024-04-22 NOTE — ED Triage Notes (Signed)
 Coming from home. Family checked on him and he was not responsive to family. Insulin  pump reading 300s CBG and EMS reader reading above 600 or High. He was recently here 01-10 for same thing hyperglycemia.   102/ 88 97

## 2024-04-22 NOTE — ED Notes (Signed)
EDP at bedside attempting [place] IV

## 2024-04-22 NOTE — ED Notes (Signed)
 Called Lab to add on Osmolality to urinalysis.

## 2024-04-22 NOTE — Plan of Care (Signed)
   Problem: Education: Goal: Ability to describe self-care measures that may prevent or decrease complications (Diabetes Survival Skills Education) will improve Outcome: Progressing

## 2024-04-23 LAB — BASIC METABOLIC PANEL WITH GFR
Anion gap: 8 (ref 5–15)
BUN: 23 mg/dL (ref 8–23)
CO2: 23 mmol/L (ref 22–32)
Calcium: 8.5 mg/dL — ABNORMAL LOW (ref 8.9–10.3)
Chloride: 110 mmol/L (ref 98–111)
Creatinine, Ser: 0.95 mg/dL (ref 0.61–1.24)
GFR, Estimated: >60 mL/min
Glucose, Bld: 47 mg/dL — ABNORMAL LOW (ref 70–99)
Potassium: 3.9 mmol/L (ref 3.5–5.1)
Sodium: 141 mmol/L (ref 135–145)

## 2024-04-23 LAB — GLUCOSE, CAPILLARY
Glucose-Capillary: 39 mg/dL — CL (ref 70–99)
Glucose-Capillary: 169 mg/dL — ABNORMAL HIGH (ref 70–99)
Glucose-Capillary: 151 mg/dL — ABNORMAL HIGH (ref 70–99)
Glucose-Capillary: 41 mg/dL — CL (ref 70–99)
Glucose-Capillary: 47 mg/dL — ABNORMAL LOW (ref 70–99)
Glucose-Capillary: 133 mg/dL — ABNORMAL HIGH (ref 70–99)
Glucose-Capillary: 107 mg/dL — ABNORMAL HIGH (ref 70–99)

## 2024-04-23 MED ORDER — ADULT MULTIVITAMIN W/MINERALS CH
1.0000 | ORAL_TABLET | Freq: Every day | ORAL | Status: DC
  Administered 2024-04-23 – 2024-04-26 (×4): 1 via ORAL
  Filled 2024-04-23 (×4): qty 1

## 2024-04-23 MED ORDER — MIDODRINE HCL 5 MG PO TABS
2.5000 mg | ORAL_TABLET | Freq: Three times a day (TID) | ORAL | Status: DC
  Administered 2024-04-23 – 2024-04-26 (×7): 2.5 mg via ORAL
  Filled 2024-04-23 (×10): qty 1

## 2024-04-23 MED ORDER — DEXTROSE 50 % IV SOLN
25.0000 mL | Freq: Once | INTRAVENOUS | Status: AC
  Administered 2024-04-23: 25 mL via INTRAVENOUS
  Filled 2024-04-23: qty 50

## 2024-04-23 MED ORDER — INSULIN GLARGINE 100 UNIT/ML ~~LOC~~ SOLN
8.0000 [IU] | Freq: Every day | SUBCUTANEOUS | Status: DC
  Filled 2024-04-23: qty 0.08

## 2024-04-23 NOTE — Progress Notes (Signed)
 [facility]  - [place] [facility]   PATIENT NAME: Nicholas Weiss    MR#:  [id number]  DATE OF BIRTH:  29-Mar-1951  SUBJECTIVE:  no family at bedside. Came in with high sugar readings on glucometer at home. On insulin  pump. Overnight glucose drop down into 39. Recovered with oral diet. Patient sugars have been labile    VITALS:  Blood pressure (!) 160/92, pulse 76, temperature 98.6 F (37 C), resp. rate 16, height 5' 10 (1.778 m), weight 56.1 kg, SpO2 100%.  PHYSICAL EXAMINATION:   GENERAL:  74 y.o.-year-old patient with no acute distress.  LUNGS: Normal breath sounds bilaterally, no wheezing CARDIOVASCULAR: S1, S2 normal. No murmur   ABDOMEN: Soft, nontender, nondistended. Bowel sounds present.  EXTREMITIES: No  edema b/l.    NEUROLOGIC: left upper extremity weakness from previous stroke    LABORATORY PANEL:  CBC Recent Labs  Lab 04/22/24 1118  WBC 4.1  HGB 8.8*  HCT 28.9*  PLT 290    Chemistries  Recent Labs  Lab 04/22/24 1118 04/23/24 0606  NA 136 141  K 5.4* 3.9  CL 101 110  CO2 24 23  GLUCOSE 438* 47*  BUN 22 23  CREATININE 1.23 0.95  CALCIUM  9.1 8.5*  AST 22  --   ALT 22  --   ALKPHOS 90  --   BILITOT 0.6  --    Cardiac Enzymes No results for input(s): TROPONINI in the last 168 hours. RADIOLOGY:  DG Chest Port 1 View Result Date: 04/22/2024 EXAM: 1 VIEW(S) XRAY OF THE CHEST 04/22/2024 11:37:00 AM COMPARISON: 01/04/2024 CLINICAL HISTORY: Questionable sepsis. FINDINGS: LUNGS AND PLEURA: Scarring and/or atelectasis at left lung base. Vague nodularity in the lungs, some of this at the lung bases may be due to nipple shadows but I cannot exclude true pulmonary nodules, dedicated chest CT is recommended. No pleural effusion. No pneumothorax. HEART AND MEDIASTINUM: Atherosclerotic calcifications. No acute abnormality of the cardiac and mediastinal silhouettes. BONES AND SOFT TISSUES: Stable of proximal humeral deformity in the metaphysis.  Old bilateral rib deformity. No acute osseous abnormality. IMPRESSION: 1. Left basilar scarring and/or atelectasis. 2. Vague pulmonary nodularity, dedicated chest CT recommended to assess for pulmonary nodules. 3. Atherosclerotic calcifications. 4. Chronic proximal humeral metaphyseal deformity and chronic bilateral rib deformities. Electronically signed by: Ryan Salvage MD 04/22/2024 12:17 PM EST RP Workstation: [id number]    Assessment and Plan  From H&P Nicholas Weiss is a 74 y.o. male with medical history significant of IDDM on insulin  pump, HTN, HLD, brought in by family member for evaluation of persistent hyperglycemia and altered mentations.   Patient was hospitalized about 2 weeks ago for similar presentation with hyperglycemia.  Daughter at bedside reported that patient wears an insulin  pump, the pump setting has a ceiling of total of 14 units in 24 hours, and for hypoglycemia event, the pump will automatically give additional bolus of insulin  according to the reading, this time, family has seen  high glucose alarm 3-5 times since 3 days ago.  This morning, patient's insulin  pump again showed high readings of more than 600, and patient started complaining about feeling nausea and became lethargic.  Denies any cough no urinary symptoms no diarrhea.  Uncontrolled type I diabetes labile sugars Hypoglycemia history of insulin  pump -- patient placed on Lantus  and sliding scale -- seen by diabetes coordinator -- has been having some issues with insulin  pump. Recommend follow-up with endocrinology-- Dr. Damian -- most recent A1c is 8.5 -- patient  tolerating PO diet well  Acute metabolic encephalopathy - Likely secondary to hyperglycemia -- Mentation improving   Seizure disorder - Clinical presentation does not appear to fit breakthrough seizure - Continue home dose of Keppra    Peripheral neuropathy - Continue Neurontin   Anemia of chronic kidney disease history of high risk IgG  Lambda multiple myeloma stage III -- stable     Procedures: Family communication : none today Consults : diabetes coordinator CODE STATUS: full DVT Prophylaxis : Lovenox  Level of care: Telemetry Status is: Observation The patient remains OBS appropriate and will d/c before 2 midnights.    TOTAL TIME TAKING CARE OF THIS PATIENT: 40 minutes.  >50% time spent on counselling and coordination of care  Note: This dictation was prepared with Dragon dictation along with smaller phrase technology. Any transcriptional errors that result from this process are unintentional.  Leita Blanch M.D    [facility]   CC: Primary care physician; Alla Amis, MD

## 2024-04-23 NOTE — Plan of Care (Signed)

## 2024-04-23 NOTE — Plan of Care (Signed)
" °  Problem: Nutritional: Goal: Maintenance of adequate nutrition will improve Outcome: Progressing   Problem: Education: Goal: Ability to describe self-care measures that may prevent or decrease complications (Diabetes Survival Skills Education) will improve Outcome: Not Progressing   Problem: Fluid Volume: Goal: Ability to maintain a balanced intake and output will improve Outcome: Not Progressing   Problem: Metabolic: Goal: Ability to maintain appropriate glucose levels will improve Outcome: Not Progressing   "

## 2024-04-23 NOTE — Progress Notes (Signed)
 Hypoglycemic Event  CBG: 39  Treatment: 8oz orange juice  Symptoms: none  Follow-up CBG: Time:0950 CBG Result:169  Possible Reasons for Event: inadequate meal intake  Comments/MD notified: Dr Leita Blanch verbal order received to not give the scheduled Lantus  injection this am    Nicholas Weiss

## 2024-04-23 NOTE — Inpatient Diabetes Management (Signed)
 Inpatient Diabetes Program Recommendations  AACE/ADA: New Consensus Statement on Inpatient Glycemic Control (2015)  Target Ranges:  Prepandial:   less than 140 mg/dL      Peak postprandial:   less than 180 mg/dL (1-2 hours)      Critically ill patients:  140 - 180 mg/dL   Lab Results  Component Value Date   GLUCAP 169 (H) 04/23/2024   HGBA1C 8.4 (H) 04/09/2024    Latest Reference Range & Units 04/22/24 17:02 04/22/24 20:56 04/23/24 07:24 04/23/24 09:50  Glucose-Capillary 70 - 99 mg/dL 568 (H) 748 (H) 39 (LL) 169 (H)  (LL): Data is critically low (H): Data is abnormally high  Diabetes history: DM1 Outpatient Diabetes medications: Insulin  Pump Dexcom G7 CGM Humalog in insulin  pump Current orders for Inpatient glycemic control: Lantus  10 units daily, Novolog  0-15 units tid, 0-5 units hs  Inpatient Diabetes Program Recommendations:   Patient had hypoglycemia post Novolog  correction. Please consider: -Decrease Lantus  to 8 units daily -Decrease Novolog  correction to 0-6 units tid, 0-5 units hs -Add Novolog  2 units tid meal coverage if eats 50% meal  Spoke with patient's significant other via phone regarding patient's insulin  pump management and glucose control. Darice (SO) enters the carb count in the insulin  pump for patient and is fearful of adding too much so she only places 10 gm per meal in which is not the amount of carb he is eating. Reviewed with Darice carb counting and his insulin  ratio. She agrees to review carbs and read labels to be more accurate. Darice did change insulin  injection sites when glucose rose higher but thought the pump and Dexcom G7 had to be on the same side (reviewed does not). When ready to go home, recommend patient restarting pump when SO is available to assist him.  Patient sees Dr. Damian for endocrinology with last office visit 03/27/24. Type 1 diabetes - His last Hb A1c was 9.7% on 01/18/2024. He started on the Tandem T:slim X2 insulin  pump in 06/2023. He  is currently using the Tandem pump and a DexCom G7 CGM. Devices were downloaded and reviewed. Pump settings: Basal 0.25 units/hrCorrection 1:60Carb ratio 1:20Target blood glucose 120 24-hr basal is 6 units.  Thank you, Mckena Chern E. Blen Ransome, RN, MSN, CNS, CDCES  Diabetes Coordinator Inpatient Glycemic Control Team Team Pager [phone] (8am-5pm) 04/23/2024 10:45 AM

## 2024-04-23 NOTE — Care Management Obs Status (Signed)
 MEDICARE OBSERVATION STATUS NOTIFICATION   Patient Details  Name: Nicholas Weiss MRN: [id number] Date of Birth: 12/02/1950   Medicare Observation Status Notification Given:  Yes    Jonpaul Lumm W, CMA 04/23/2024, 1:31 PM

## 2024-04-23 NOTE — Progress Notes (Addendum)
 Hypoglycemic Event  CBG: 41 at 1609  Treatment: 360 ml of orange juice at 1609  Symptoms: none  Follow-up CBG: Time:47 CBG Result:1631  Possible Reasons for Event: inadequate meal intake  Comments/MD notified:Dr Leita Blanch verbal orders received to give 1/2 amp of D50 and for future SSI results to not give insulin  until pt has PO intake of at least 50% with meals;  1/2 amp of D50 given at 1646 via right anterior distal arm peripheral IV site; pt tolerated well, pt set up for dinner to eat; will recheck finger stick blood sugar at 1730  FSBS = 133 at 1732  Nicholas Weiss [employer]

## 2024-04-24 LAB — GLUCOSE, CAPILLARY
Glucose-Capillary: 138 mg/dL — ABNORMAL HIGH (ref 70–99)
Glucose-Capillary: 147 mg/dL — ABNORMAL HIGH (ref 70–99)
Glucose-Capillary: 82 mg/dL (ref 70–99)
Glucose-Capillary: 62 mg/dL — ABNORMAL LOW (ref 70–99)
Glucose-Capillary: 101 mg/dL — ABNORMAL HIGH (ref 70–99)

## 2024-04-24 MED ORDER — INSULIN ASPART 100 UNIT/ML IJ SOLN: 0.0000 [IU] | Freq: Every day | INTRAMUSCULAR | Status: DC

## 2024-04-24 MED ORDER — INSULIN GLARGINE 100 UNIT/ML ~~LOC~~ SOLN
6.0000 [IU] | Freq: Every day | SUBCUTANEOUS | Status: DC
  Administered 2024-04-24: 6 [IU] via SUBCUTANEOUS
  Filled 2024-04-24 (×2): qty 0.06

## 2024-04-24 MED ORDER — INSULIN ASPART 100 UNIT/ML IJ SOLN
0.0000 [IU] | Freq: Three times a day (TID) | INTRAMUSCULAR | Status: DC
  Administered 2024-04-24 (×2): 1 [IU] via SUBCUTANEOUS
  Filled 2024-04-24: qty 1

## 2024-04-24 NOTE — Progress Notes (Signed)
 [facility]  - [place] at [facility]   PATIENT NAME: Nicholas Weiss    MR#:  [id number]  DATE OF BIRTH:  1950/07/04  SUBJECTIVE:  Met Nicholas Weiss at bedside. Came in with high sugar readings on glucometer at home.  Asymptomatic today. Sugar dropped to 47 yday pm Ate good BF. Sugars so far good. Nicholas Weiss mentions sugars labile  VITALS:  Blood pressure 132/77, pulse 84, temperature 98.1 F (36.7 C), temperature source Oral, resp. rate 16, height 5' 10 (1.778 m), weight 56.1 kg, SpO2 100%.  PHYSICAL EXAMINATION:   GENERAL:  74 y.o.-year-old patient with no acute distress.  LUNGS: Normal breath sounds bilaterally, no wheezing CARDIOVASCULAR: S1, S2 normal. No murmur   ABDOMEN: Soft, nontender, nondistended. Bowel sounds present.  EXTREMITIES: No  edema b/l.    NEUROLOGIC: left upper extremity weakness from previous stroke    LABORATORY PANEL:  CBC Recent Labs  Lab 04/22/24 1118  WBC 4.1  HGB 8.8*  HCT 28.9*  PLT 290    Chemistries  Recent Labs  Lab 04/22/24 1118 04/23/24 0606  NA 136 141  K 5.4* 3.9  CL 101 110  CO2 24 23  GLUCOSE 438* 47*  BUN 22 23  CREATININE 1.23 0.95  CALCIUM  9.1 8.5*  AST 22  --   ALT 22  --   ALKPHOS 90  --   BILITOT 0.6  --    Cardiac Enzymes No results for input(s): TROPONINI in the last 168 hours. RADIOLOGY:  No results found.   Assessment and Plan  From H&P Nicholas Weiss is a 74 y.o. male with medical history significant of IDDM on insulin  pump, HTN, HLD, brought in by family member for evaluation of persistent hyperglycemia and altered mentations.   Patient was hospitalized about 2 weeks ago for similar presentation with hyperglycemia.  Daughter at bedside reported that patient wears an insulin  pump, the pump setting has a ceiling of total of 14 units in 24 hours, and for hypoglycemia event, the pump will automatically give additional bolus of insulin  according to the reading, this time, family has seen  high  glucose alarm 3-5 times since 3 days ago.  This morning, patient's insulin  pump again showed high readings of more than 600, and patient started complaining about feeling nausea and became lethargic.  Denies any cough no urinary symptoms no diarrhea.  Uncontrolled type I diabetes labile sugars Hypoglycemia history of insulin  pump -- patient placed on Lantus  and sliding scale -- seen by diabetes coordinator -- has been having some issues with insulin  pump. Recommend follow-up with endocrinology-- Dr. Damian -- most recent A1c is 8.5 -- patient tolerating PO diet well --cont lantus  and SSI--dose adjusted. Pt will resume Insulin  pump once he goes home. --F/u Dr Damian on feb 9th  Acute metabolic encephalopathy - Likely secondary to hyperglycemia -- Mentation back to baseline   Seizure disorder - Clinical presentation does not appear to fit breakthrough seizure - Continue home dose of Keppra    Peripheral neuropathy - Continue Neurontin   Anemia of chronic kidney disease history of high risk IgG Lambda multiple myeloma stage III -- stable   Will monitor sugars one more day  Procedures: Family communication :Nicholas Weiss Consults : diabetes coordinator CODE STATUS: full DVT Prophylaxis : Lovenox  Level of care: Telemetry Status is: Observation The patient remains OBS appropriate and will d/c before 2 midnights.      TOTAL TIME TAKING CARE OF THIS PATIENT: 35 minutes.  >50% time spent on counselling and coordination  of care  Note: This dictation was prepared with Dragon dictation along with smaller phrase technology. Any transcriptional errors that result from this process are unintentional.  Nicholas Weiss M.D    [facility]   CC: Primary care physician; Alla Amis, MD

## 2024-04-24 NOTE — TOC Initial Note (Signed)
 Transition of Care [facility]) - Initial/Assessment Note    Patient Details  Name: Nicholas Weiss MRN: [id number] Date of Birth: 1951/02/09  [facility] CM/SW Contact:    Grayce JAYSON Perfect, RN Phone Number: 04/24/2024, 9:36 AM  Clinical Narrative:     RNCM completed chart review.  Patient admitted to [facility] recently for same issue.  Patient lives with girlfriend and his son.  Patient states they do everything or him  Patient states he has not walked since last October.  Currently has homecare services of PT/RN/OT , verified with [place] at [facility].  They will resume services when patient stable to discharge back home.  Patient does not drive.  Denies any issues with obtaining or paying or medications.    Has DME in home, wheelchair, walker, shower chair, hospital bed.  He does have a PCP.  RNCM will continue to follow until discharge.              Expected Discharge Plan: Home/Self Care Barriers to Discharge: Continued Medical Work up   Patient Goals and CMS Choice            Expected Discharge Plan and Services   Discharge Planning Services: CM Consult   Living arrangements for the past 2 months: Single Family Home                                      Prior Living Arrangements/Services Living arrangements for the past 2 months: Single Family Home Lives with:: Self Patient language and need for interpreter reviewed:: Yes Do you feel safe going back to the place where you live?: Yes      Need for Family Participation in Patient Care: Yes (Comment) Care giver support system in place?: Yes (comment)   Criminal Activity/Legal Involvement Pertinent to Current Situation/Hospitalization: No - Comment as needed  Activities of Daily Living   ADL Screening (condition at time of admission) Independently performs ADLs?: No Does the patient have a NEW difficulty with bathing/dressing/toileting/self-feeding that is expected to last >3 days?: No Does the patient have a  NEW difficulty with getting in/out of bed, walking, or climbing stairs that is expected to last >3 days?: No Does the patient have a NEW difficulty with communication that is expected to last >3 days?: No Is the patient deaf or have difficulty hearing?: No Does the patient have difficulty seeing, even when wearing glasses/contacts?: No Does the patient have difficulty concentrating, remembering, or making decisions?: No  Permission Sought/Granted                  Emotional Assessment              Admission diagnosis:  Weakness [R53.1] Hyperglycemia [R73.9] Hypotension, unspecified hypotension type [I95.9] Patient Active Problem List   Diagnosis Date Noted   Peripheral neuropathy 04/09/2024   Uncontrolled type 2 diabetes mellitus with hyperosmolar nonketotic hyperglycemia (HCC) 04/09/2024   History of normocytic normochromic anemia 01/26/2024   Declining functional status 01/26/2024   Protein-calorie malnutrition, moderate 11/22/2023   Left supracondylar humerus fracture, sequela 11/16/2023   Atrial fibrillation, chronic (HCC) 11/16/2023   Benign prostatic hyperplasia 09/18/2023   Fracture of shaft of humerus 09/18/2023   Multiple sclerosis 09/18/2023   Traumatic intracranial subdural hemorrhage (HCC) 09/18/2023   Drop in hemoglobin 09/16/2023   Left arm pain 09/15/2023   Fall 09/15/2023   Polysubstance abuse (HCC) 09/15/2023  Myocardial injury 09/15/2023   Subdural bleeding (HCC) 08/27/2023   SDH (subdural hematoma) (HCC) 08/27/2023   MCI (mild cognitive impairment) with memory loss 08/18/2023   DKA (diabetic ketoacidosis) (HCC) 08/15/2023   High anion gap metabolic acidosis 08/15/2023   Prolonged QT interval 07/22/2023   Uncontrolled type 1 diabetes mellitus with hypoglycemia, with long-term current use of insulin  (HCC) 04/05/2023   Hyperosmolar hyperglycemic state (HHS) (HCC) 03/26/2023   Iron  deficiency anemia 03/26/2023   Seizure disorder (HCC) 03/26/2023    Systemic inflammatory response syndrome (SIRS) associated with organ dysfunction (HCC) 03/26/2023   NSTEMI (non-ST elevated myocardial infarction) (HCC) 03/09/2023   Elevated troponin 03/08/2023   Paroxysmal atrial fibrillation (HCC) 03/08/2023   Chronic anticoagulation 03/08/2023   ICH (intracerebral hemorrhage) (HCC) 02/16/2023   Uncontrolled diabetes mellitus with hyperglycemia (HCC) 02/16/2023   GERD without esophagitis 02/16/2023   Diabetic neuropathy (HCC) 02/16/2023   Intraventricular hemorrhage (HCC) 02/16/2023   FTT (failure to thrive) in adult 01/26/2023   Myoclonus 01/15/2023   Hypothermia 12/29/2022   Dizziness 11/13/2022   Substance abuse (HCC) 09/18/2022   History of GI bleed 09/18/2022   History of intraventricular hemorrhage following syncopal event 02/15/2023 09/18/2022   Type 1 diabetes mellitus with other specified complication (HCC) 08/26/2022   Postural dizziness with presyncope 08/18/2022   Near syncope 08/18/2022   Closed fracture of shaft of left humerus 06/18/2022   Malnutrition of moderate degree 06/10/2022   Orthostatic hypotension 06/10/2022   GI bleed 06/06/2022   Subdural hematoma (HCC) 05/15/2022   Falls 05/15/2022   Shoulder pain, left 05/15/2022   History of amputation of left great toe 05/04/2022   Hypophosphatemia 04/28/2022   Protein-calorie malnutrition, severe 04/24/2022   Diabetic ulcer of toe associated with diabetes mellitus due to underlying condition, with fat layer exposed (HCC) 04/24/2022   Streptococcal bacteremia 04/24/2022   Leukopenia 04/24/2022   History of multiple myeloma 04/24/2022   Dysautonomia orthostatic hypotension with recurrent syncope 04/24/2022   Weight loss 04/23/2022   Osteomyelitis of left foot (HCC) 04/22/2022   DKA, type 1 (HCC) 04/22/2022   Dehydration 04/08/2022   Diabetic ketoacidosis without coma associated with type 1 diabetes mellitus (HCC) 04/07/2022   Hyperkalemia 04/07/2022   Traumatic pneumothorax  02/18/2022   Multiple fractures of ribs, left side, initial encounter for closed fracture 02/18/2022   Pneumothorax, closed, traumatic 02/18/2022   Benign prostatic hyperplasia with nocturia 02/12/2022   Stroke (cerebrum) (HCC) 11/07/2021   Multiple myeloma (HCC) 09/29/2021   Multiple myeloma not having achieved remission (HCC) 09/29/2021   Hyponatremia 08/18/2021   COVID-19 virus infection 08/13/2021   History of CVA (cerebrovascular accident) 08/13/2021   Syncope and collapse, recurrent 08/09/2021   Hepatic lesion 08/05/2021   Dyslipidemia 05/08/2021   History of substance abuse (HCC) 12/09/2020   Acute metabolic encephalopathy 12/02/2020   Cocaine abuse (HCC) 11/30/2020   Muscle twitching 09/12/2020   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   Pre-syncope 08/01/2020   Heart block AV first degree 08/01/2020   Anemia of chronic disease 05/28/2020   PUD (peptic ulcer disease)    Esophageal dysphagia    Encounter for screening colonoscopy    Hyperglycemia 07/28/2019   Generalized weakness 07/28/2019   Hypoglycemia 04/27/2019   Unresponsiveness 04/27/2019   Lung nodule 04/07/2019   Acute renal failure superimposed on stage 3a chronic kidney disease (HCC)    Hypertensive urgency 04/05/2019   AKI (acute kidney injury) 04/05/2019   CKD (chronic kidney disease) stage 3, GFR 30-59 ml/min (HCC) 04/05/2019   Hyperlipidemia  01/24/2016   Type 1 diabetes mellitus with diabetic neuropathy, unspecified (HCC) 05/31/2006   PCP:  Alla Amis, MD Pharmacy:   [facility] [address] ([address]), [place] - [address] [place] ([place]) [place] [address] Phone: [phone] Fax: [phone]  [facility] - [place], [place] - [address] [place] [address] Phone: [phone] Fax: [phone]     Social Drivers of Health (SDOH) Social History: SDOH Screenings   Food Insecurity: No Food Insecurity  (04/22/2024)  Housing: Low Risk (04/22/2024)  Transportation Needs: No Transportation Needs (04/22/2024)  Utilities: Not At Risk (04/22/2024)  Depression (PHQ2-9): Low Risk (04/11/2024)  Financial Resource Strain: Low Risk  (10/20/2023)   Received from [facility]  Social Connections: Moderately Isolated (04/22/2024)  Tobacco Use: Low Risk (04/22/2024)  Recent Concern: Tobacco Use - Medium Risk (03/27/2024)   Received from [facility]   SDOH Interventions:     Readmission Risk Interventions    02/16/2023    3:56 PM 08/24/2022   10:57 AM 05/20/2022   11:41 AM  Readmission Risk Prevention Plan  Transportation Screening Complete Complete Complete  Medication Review Oceanographer) Complete Complete Complete  PCP or Specialist appointment within 3-5 days of discharge Complete Complete Complete  HRI or Home Care Consult Complete Complete Complete  SW Recovery Care/Counseling Consult Not Complete    Palliative Care Screening Not Applicable  Complete  Skilled Nursing Facility Complete Complete Complete

## 2024-04-25 ENCOUNTER — Inpatient Hospital Stay

## 2024-04-25 ENCOUNTER — Inpatient Hospital Stay: Admitting: Oncology

## 2024-04-25 DIAGNOSIS — R531 Weakness: Secondary | ICD-10-CM | POA: Diagnosis present

## 2024-04-25 DIAGNOSIS — Z79899 Other long term (current) drug therapy: Secondary | ICD-10-CM | POA: Diagnosis not present

## 2024-04-25 DIAGNOSIS — G40909 Epilepsy, unspecified, not intractable, without status epilepticus: Secondary | ICD-10-CM | POA: Diagnosis present

## 2024-04-25 DIAGNOSIS — I959 Hypotension, unspecified: Secondary | ICD-10-CM | POA: Diagnosis present

## 2024-04-25 DIAGNOSIS — I69334 Monoplegia of upper limb following cerebral infarction affecting left non-dominant side: Secondary | ICD-10-CM | POA: Diagnosis not present

## 2024-04-25 DIAGNOSIS — Z794 Long term (current) use of insulin: Secondary | ICD-10-CM | POA: Diagnosis not present

## 2024-04-25 DIAGNOSIS — N1831 Chronic kidney disease, stage 3a: Secondary | ICD-10-CM | POA: Diagnosis present

## 2024-04-25 DIAGNOSIS — G9341 Metabolic encephalopathy: Secondary | ICD-10-CM | POA: Diagnosis present

## 2024-04-25 DIAGNOSIS — Z89422 Acquired absence of other left toe(s): Secondary | ICD-10-CM | POA: Diagnosis not present

## 2024-04-25 DIAGNOSIS — Z1152 Encounter for screening for COVID-19: Secondary | ICD-10-CM | POA: Diagnosis not present

## 2024-04-25 DIAGNOSIS — Z8579 Personal history of other malignant neoplasms of lymphoid, hematopoietic and related tissues: Secondary | ICD-10-CM | POA: Diagnosis not present

## 2024-04-25 DIAGNOSIS — Z9641 Presence of insulin pump (external) (internal): Secondary | ICD-10-CM | POA: Diagnosis present

## 2024-04-25 DIAGNOSIS — E1022 Type 1 diabetes mellitus with diabetic chronic kidney disease: Secondary | ICD-10-CM | POA: Diagnosis present

## 2024-04-25 DIAGNOSIS — G629 Polyneuropathy, unspecified: Secondary | ICD-10-CM | POA: Diagnosis present

## 2024-04-25 DIAGNOSIS — E7800 Pure hypercholesterolemia, unspecified: Secondary | ICD-10-CM | POA: Diagnosis present

## 2024-04-25 DIAGNOSIS — D631 Anemia in chronic kidney disease: Secondary | ICD-10-CM | POA: Diagnosis present

## 2024-04-25 DIAGNOSIS — I129 Hypertensive chronic kidney disease with stage 1 through stage 4 chronic kidney disease, or unspecified chronic kidney disease: Secondary | ICD-10-CM | POA: Diagnosis present

## 2024-04-25 DIAGNOSIS — Z8249 Family history of ischemic heart disease and other diseases of the circulatory system: Secondary | ICD-10-CM | POA: Diagnosis not present

## 2024-04-25 DIAGNOSIS — E1069 Type 1 diabetes mellitus with other specified complication: Secondary | ICD-10-CM | POA: Diagnosis present

## 2024-04-25 DIAGNOSIS — E1065 Type 1 diabetes mellitus with hyperglycemia: Secondary | ICD-10-CM | POA: Diagnosis present

## 2024-04-25 DIAGNOSIS — E861 Hypovolemia: Secondary | ICD-10-CM | POA: Diagnosis present

## 2024-04-25 DIAGNOSIS — E860 Dehydration: Secondary | ICD-10-CM | POA: Diagnosis present

## 2024-04-25 LAB — GLUCOSE, CAPILLARY
Glucose-Capillary: 44 mg/dL — CL (ref 70–99)
Glucose-Capillary: 79 mg/dL (ref 70–99)
Glucose-Capillary: 325 mg/dL — ABNORMAL HIGH (ref 70–99)
Glucose-Capillary: 377 mg/dL — ABNORMAL HIGH (ref 70–99)
Glucose-Capillary: 150 mg/dL — ABNORMAL HIGH (ref 70–99)

## 2024-04-25 MED ORDER — INSULIN GLARGINE-YFGN 100 UNIT/ML ~~LOC~~ SOLN
5.0000 [IU] | Freq: Every day | SUBCUTANEOUS | Status: DC
  Administered 2024-04-25 – 2024-04-26 (×2): 5 [IU] via SUBCUTANEOUS
  Filled 2024-04-25 (×2): qty 0.05

## 2024-04-25 MED ORDER — INSULIN ASPART 100 UNIT/ML IJ SOLN
0.0000 [IU] | INTRAMUSCULAR | Status: DC
  Administered 2024-04-25: 5 [IU] via SUBCUTANEOUS
  Administered 2024-04-26: 2 [IU] via SUBCUTANEOUS
  Filled 2024-04-25: qty 5
  Filled 2024-04-25: qty 2

## 2024-04-25 NOTE — Progress Notes (Signed)
 [facility]  - [place] at [facility]   PATIENT NAME: Nicholas Weiss    MR#:  [id number]  DATE OF BIRTH:  July 07, 1950  SUBJECTIVE:  Met [name] at bedside. Came in with high sugar readings on glucometer at home.  Asymptomatic today. Sugar dropped to 44 today am--now 325  [name] mentions sugars labile  VITALS:  Blood pressure (!) 162/83, pulse 87, temperature 97.6 F (36.4 C), temperature source Oral, resp. rate 19, height 5' 10 (1.778 m), weight 56.1 kg, SpO2 100%.  PHYSICAL EXAMINATION:   GENERAL:  74 y.o.-year-old patient with no acute distress.  LUNGS: Normal breath sounds bilaterally, no wheezing CARDIOVASCULAR: S1, S2 normal. No murmur   ABDOMEN: Soft, nontender, nondistended. Bowel sounds present.  EXTREMITIES: No  edema b/l.    NEUROLOGIC: left upper extremity weakness from previous stroke    LABORATORY PANEL:  CBC Recent Labs  Lab 04/22/24 1118  WBC 4.1  HGB 8.8*  HCT 28.9*  PLT 290    Chemistries  Recent Labs  Lab 04/22/24 1118 04/23/24 0606  NA 136 141  K 5.4* 3.9  CL 101 110  CO2 24 23  GLUCOSE 438* 47*  BUN 22 23  CREATININE 1.23 0.95  CALCIUM  9.1 8.5*  AST 22  --   ALT 22  --   ALKPHOS 90  --   BILITOT 0.6  --    Cardiac Enzymes No results for input(s): TROPONINI in the last 168 hours. RADIOLOGY:  No results found.   Assessment and Plan  From H&P LENNIN OSMOND is a 74 y.o. male with medical history significant of IDDM on insulin  pump, HTN, HLD, brought in by family member for evaluation of persistent hyperglycemia and altered mentations.   Patient was hospitalized about 2 weeks ago for similar presentation with hyperglycemia.  Daughter at bedside reported that patient wears an insulin  pump, the pump setting has a ceiling of total of 14 units in 24 hours, and for hypoglycemia event, the pump will automatically give additional bolus of insulin  according to the reading, this time, family has seen  high glucose alarm 3-5  times since 3 days ago.  This morning, patient's insulin  pump again showed high readings of more than 600, and patient started complaining about feeling nausea and became lethargic.  Denies any cough no urinary symptoms no diarrhea.  Uncontrolled type I diabetes labile sugars Hypoglycemia history of insulin  pump -- patient placed on Lantus  and sliding scale -- seen by diabetes coordinator -- has been having some issues with insulin  pump. Recommend follow-up with endocrinology-- Dr. Damian -- most recent A1c is 8.5 -- patient tolerating PO diet well --cont lantus  and SSI--dose adjusted. Pt will resume Insulin  pump once he goes home. --F/u Dr Damian on feb 9th --1/27--given labile sugars spoke with Dr Damian on the phone--recommends resume his insulin  pump current dosage(most recent) and monitor sugars for few hours --d/w sharon at bedside-- will d/c later today if sugars remains stable after insulin  pump  Acute metabolic encephalopathy - Likely secondary to hyperglycemia -- Mentation back to baseline   Seizure disorder - Continue home dose of Keppra    Peripheral neuropathy - Continue Neurontin   Anemia of chronic kidney disease history of high risk IgG Lambda multiple myeloma stage III -- stable   Will monitor sugars one more day  Procedures: Family communication :[name] Consults : diabetes coordinator CODE STATUS: full DVT Prophylaxis : Lovenox  Level of care: Telemetry Status is: Observation The patient remains OBS appropriate and will d/c before  2 midnights.      TOTAL TIME TAKING CARE OF THIS PATIENT: 35 minutes.  >50% time spent on counselling and coordination of care  Note: This dictation was prepared with Dragon dictation along with smaller phrase technology. Any transcriptional errors that result from this process are unintentional.  Leita Blanch M.D    [facility]   CC: Primary care physician; Alla Amis, MD

## 2024-04-25 NOTE — Plan of Care (Signed)

## 2024-04-25 NOTE — Discharge Instructions (Signed)
 Keep log of sugars at home and if any concerns then call Dr Brigid office  Pt's family to resume Insulin  pump from 04/27/2024 at Breakfast

## 2024-04-25 NOTE — TOC Progression Note (Signed)
 Transition of Care [facility]) - Progression Note    Patient Details  Name: Nicholas Weiss MRN: [id number] Date of Birth: 1951/03/18  [facility] CM/SW Contact  Grayce JAYSON Perfect, RN Phone Number: 04/25/2024, 2:19 PM  Clinical Narrative:   RNCM met with patient and his girlfriend in room.  They are ok with resuming homecare services with [facility] once discharged from hospital.  No other needs at this time, will continue to follow until discharge.    Expected Discharge Plan: Home/Self Care Barriers to Discharge: Continued Medical Work up               Expected Discharge Plan and Services   Discharge Planning Services: CM Consult   Living arrangements for the past 2 months: Single Family Home                                       Social Drivers of Health (SDOH) Interventions SDOH Screenings   Food Insecurity: No Food Insecurity (04/22/2024)  Housing: Low Risk (04/22/2024)  Transportation Needs: No Transportation Needs (04/22/2024)  Utilities: Not At Risk (04/22/2024)  Depression (PHQ2-9): Low Risk (04/11/2024)  Financial Resource Strain: Low Risk  (10/20/2023)   [facility]  Social Connections: Moderately Isolated (04/22/2024)  Tobacco Use: Low Risk (04/22/2024)  Recent Concern: Tobacco Use - Medium Risk (03/27/2024)   Received from [facility]    Readmission Risk Interventions    02/16/2023    3:56 PM 08/24/2022   10:57 AM 05/20/2022   11:41 AM  Readmission Risk Prevention Plan  Transportation Screening Complete Complete Complete  Medication Review Oceanographer) Complete Complete Complete  PCP or Specialist appointment within 3-5 days of discharge Complete Complete Complete  HRI or Home Care Consult Complete Complete Complete  SW Recovery Care/Counseling Consult Not Complete    Palliative Care Screening Not Applicable  Complete  Skilled Nursing Facility Complete Complete Complete

## 2024-04-26 DIAGNOSIS — E1065 Type 1 diabetes mellitus with hyperglycemia: Secondary | ICD-10-CM

## 2024-04-26 DIAGNOSIS — G9341 Metabolic encephalopathy: Secondary | ICD-10-CM | POA: Diagnosis not present

## 2024-04-26 DIAGNOSIS — E11649 Type 2 diabetes mellitus with hypoglycemia without coma: Secondary | ICD-10-CM

## 2024-04-26 LAB — GLUCOSE, CAPILLARY
Glucose-Capillary: 108 mg/dL — ABNORMAL HIGH (ref 70–99)
Glucose-Capillary: 222 mg/dL — ABNORMAL HIGH (ref 70–99)
Glucose-Capillary: 37 mg/dL — CL (ref 70–99)
Glucose-Capillary: 41 mg/dL — CL (ref 70–99)
Glucose-Capillary: 59 mg/dL — ABNORMAL LOW (ref 70–99)
Glucose-Capillary: 69 mg/dL — ABNORMAL LOW (ref 70–99)
Glucose-Capillary: 80 mg/dL (ref 70–99)
Glucose-Capillary: 81 mg/dL (ref 70–99)

## 2024-04-26 NOTE — Discharge Summary (Signed)
 " Physician Discharge Summary   Patient: Nicholas Weiss MRN: [id number] DOB: Apr 17, 1950  Admit date:     04/22/2024  Discharge date: 04/26/24  Discharge Physician: Leita Blanch   PCP: Alla Amis, MD   Recommendations at discharge:   resume your insulin  pump tomorrow at breakfast. Keep log of sugars and rechartered Dr. Brigid office if you have any issues follow-up PCP in 1 to 2 week  Discharge Diagnoses: Principal Problem:   Hyperglycemia Active Problems:   Acute metabolic encephalopathy   Uncontrolled diabetes mellitus with hyperglycemia (HCC)   Type 1 diabetes mellitus with other specified complication (HCC)   From H&P DELSHAWN Weiss is a 74 y.o. male with medical history significant of IDDM on insulin  pump, HTN, HLD, brought in by family member for evaluation of persistent hyperglycemia and altered mentations.   Patient was hospitalized about 2 weeks ago for similar presentation with hyperglycemia.  Daughter at bedside reported that patient wears an insulin  pump, the pump setting has a ceiling of total of 14 units in 24 hours, and for hypoglycemia event, the pump will automatically give additional bolus of insulin  according to the reading, this time, family has seen  high glucose alarm 3-5 times since 3 days ago.  This morning, patient's insulin  pump again showed high readings of more than 600, and patient started complaining about feeling nausea and became lethargic.  Denies any cough no urinary symptoms no diarrhea.   Uncontrolled type I diabetes labile/Brittle sugars Hypoglycemia history of insulin  pump -- patient placed on Lantus  and sliding scale -- seen by diabetes coordinator -- has been having some issues with insulin  pump. Recommend follow-up with endocrinology-- Dr. Damian -- most recent A1c is 8.5 -- patient tolerating PO diet well --cont lantus  and SSI--dose adjusted. Pt will resume Insulin  pump once he goes home. --F/u Dr Damian on feb 9th --1/27--given  labile sugars spoke with Dr Damian on the phone--recommends resume his insulin  pump current dosage(most recent) and monitor sugars for few hours --d/w Nicholas Weiss at bedside-- will d/c later today if sugars remains stable after insulin  pump --1/28--sugars finally stabilized. Last CBG was 108. Spoke with Nicholas Weiss to resume insulin  pump at BF 04/27/2024   Acute metabolic encephalopathy - Likely secondary to hyperglycemia -- Mentation back to baseline   Seizure disorder - Continue home dose of Keppra    Peripheral neuropathy - Continue Neurontin    Anemia of chronic kidney disease history of high risk IgG Lambda multiple myeloma stage III -- stable   D/c home. TOC to arrange transportation    Family communication :Nicholas Weiss Consults : diabetes coordinator CODE STATUS: full DVT Prophylaxis : Lovenox      Diet recommendation:  Carb modified diet DISCHARGE MEDICATION: Allergies as of 04/26/2024       Reactions   Penicillins Other (See Comments)   Childhood allergy -tolerated amoxil  03/2022 Unknown reaction Has patient had a PCN reaction causing immediate rash, facial/tongue/throat swelling, SOB or lightheadedness with hypotension: No Has patient had a PCN reaction causing severe rash involving mucus membranes or skin necrosis: No Has patient had a PCN reaction that required hospitalization No Has patient had a PCN reaction occurring within the last 10 years: No If all of the above answers are NO, then may proceed with Cephalosporin use.        Medication List     STOP taking these medications    gabapentin  300 MG capsule Commonly known as: NEURONTIN        TAKE these medications    acetaminophen   325 MG tablet Commonly known as: TYLENOL  Take 2 tablets (650 mg total) by mouth every 6 (six) hours as needed for mild pain (pain score 1-3) or fever.   acyclovir  400 MG tablet Commonly known as: ZOVIRAX  Take 1 tablet by mouth twice daily   atorvastatin  80 MG tablet Commonly  known as: LIPITOR  Take 1 tablet (80 mg total) by mouth at bedtime. Hold while taking Paxlovid    bisacodyl  5 MG EC tablet Commonly known as: DULCOLAX Take 5 mg by mouth daily as needed.   Dexcom G7 Sensor Misc SMARTSIG:Every 10 Days   Fe Fum-Vit C-Vit B12-FA Caps capsule Commonly known as: TRIGELS-F FORTE Take 1 capsule by mouth 2 (two) times daily.   insulin  lispro 100 UNIT/ML injection Commonly known as: HUMALOG Inject 0-12 Units into the skin 4 (four) times daily -  with meals and at bedtime. Inject as per sliding scale:  If 150-200=    0 units 201-250=       3 units 251-300=       5 units 301-350=      7 units 351-400=      9 units For blood sugar >400, give 10 units and notify MD/NP   latanoprost  0.005 % ophthalmic solution Commonly known as: XALATAN  Place 1 drop into both eyes at bedtime.   levETIRAcetam  500 MG tablet Commonly known as: KEPPRA  Take 1 tablet by mouth twice daily   midodrine  2.5 MG tablet Commonly known as: PROAMATINE  Take 2.5 mg by mouth 3 (three) times daily.   multivitamin with minerals Tabs tablet Take 1 tablet by mouth daily.   nystatin powder Commonly known as: MYCOSTATIN/NYSTOP APPLY TO THE PALM OF THE HAND ONCE DAILY WHERE FUNGAL RASH IS PRESENT   pomalidomide  2 MG capsule Commonly known as: POMALYST  Take 2 mg by mouth daily. [employer] Auth # none     Date Obtained 1/24   Tandem t:slim ASFT XC PK10 23 Misc        Follow-up Information     Alla Amis, MD Follow up.   Specialty: Family Medicine Why: hospital follow up Contact information: [address] [facility] [place] [address] [phone]         Damian Therisa HERO, MD. Schedule an appointment as soon as possible for a visit in 1 week(s).   Specialty: Endocrinology Why: Labile Blood sugars Contact information: [address] [facility] [place] [address] [phone]                Discharge Exam: Nicholas Weiss  Weights   04/22/24 1102 04/22/24 1402  Weight: 68 kg 56.1 kg   GENERAL:  74 y.o.-year-old patient with no acute distress.  LUNGS: Normal breath sounds bilaterally, no wheezing CARDIOVASCULAR: S1, S2 normal. No murmur   ABDOMEN: Soft, nontender, nondistended. Bowel sounds present.  EXTREMITIES: left UE chronic swelling NEUROLOGIC: left upper extremity weakness from previous stroke   Condition at discharge: fair  The results of significant diagnostics from this hospitalization (including imaging, microbiology, ancillary and laboratory) are listed below for reference.   Imaging Studies: DG Chest Port 1 View Result Date: 04/22/2024 EXAM: 1 VIEW(S) XRAY OF THE CHEST 04/22/2024 11:37:00 AM COMPARISON: 01/04/2024 CLINICAL HISTORY: Questionable sepsis. FINDINGS: LUNGS AND PLEURA: Scarring and/or atelectasis at left lung base. Vague nodularity in the lungs, some of this at the lung bases may be due to nipple shadows but I cannot exclude true pulmonary nodules, dedicated chest CT is recommended. No pleural effusion. No pneumothorax. HEART AND  MEDIASTINUM: Atherosclerotic calcifications. No acute abnormality of the cardiac and mediastinal silhouettes. BONES AND SOFT TISSUES: Stable of proximal humeral deformity in the metaphysis. Old bilateral rib deformity. No acute osseous abnormality. IMPRESSION: 1. Left basilar scarring and/or atelectasis. 2. Vague pulmonary nodularity, dedicated chest CT recommended to assess for pulmonary nodules. 3. Atherosclerotic calcifications. 4. Chronic proximal humeral metaphyseal deformity and chronic bilateral rib deformities. Electronically signed by: Ryan Salvage MD 04/22/2024 12:17 PM EST RP Workstation: [id number]   CT Head Wo Contrast Result Date: 04/08/2024 CLINICAL DATA:  Acute encephalopathy, hyperglycemia EXAM: CT HEAD WITHOUT CONTRAST TECHNIQUE: Contiguous axial images were obtained from the base of the skull through the vertex without intravenous contrast.  RADIATION DOSE REDUCTION: This exam was performed according to the departmental dose-optimization program which includes automated exposure control, adjustment of the mA and/or kV according to patient size and/or use of iterative reconstruction technique. COMPARISON:  01/04/2024 FINDINGS: Brain: No acute infarct or hemorrhage. Stable chronic small-vessel ischemic changes throughout the white matter. Lateral ventricles and midline structures are unremarkable. Benign calcifications are seen along the falx and tentorium. No acute extra-axial fluid collections. No mass effect. Vascular: No hyperdense vessel or unexpected calcification. Skull: Normal. Negative for fracture or focal lesion. Sinuses/Orbits: No acute finding. Other: None. IMPRESSION: 1. Stable head CT, no acute intracranial process. Electronically Signed   By: Ozell Daring M.D.   On: 04/08/2024 20:23    Microbiology: Results for orders placed or performed during the hospital encounter of 04/22/24  Blood Culture (routine x 2)     Status: None (Preliminary result)   Collection Time: 04/22/24 11:18 AM   Specimen: BLOOD  Result Value Ref Range Status   Specimen Description BLOOD BLOOD LEFT HAND  Final   Special Requests   Final    BOTTLES DRAWN AEROBIC AND ANAEROBIC Blood Culture adequate volume   Culture   Final    NO GROWTH 3 DAYS Performed at [facility], [address], [place], [place] [address]    Report Status PENDING  Incomplete  Blood Culture (routine x 2)     Status: None (Preliminary result)   Collection Time: 04/22/24 11:18 AM   Specimen: BLOOD  Result Value Ref Range Status   Specimen Description BLOOD LEFT ANTECUBITAL  Final   Special Requests   Final    BOTTLES DRAWN AEROBIC AND ANAEROBIC Blood Culture results may not be optimal due to an inadequate volume of blood received in culture bottles   Culture   Final    NO GROWTH 3 DAYS Performed at [facility], [address], [place], [place]  [address]    Report Status PENDING  Incomplete  Resp panel by RT-PCR (RSV, Flu A&B, Covid) Urine, Clean Catch     Status: None   Collection Time: 04/22/24 11:18 AM   Specimen: Urine, Clean Catch; Nasal Swab  Result Value Ref Range Status   SARS Coronavirus 2 by RT PCR NEGATIVE NEGATIVE Final    Comment: (NOTE) SARS-CoV-2 target nucleic acids are NOT DETECTED.  The SARS-CoV-2 RNA is generally detectable in upper respiratory specimens during the acute phase of infection. The lowest concentration of SARS-CoV-2 viral copies this assay can detect is 138 copies/mL. A negative result does not preclude SARS-Cov-2 infection and should not be used as the sole basis for treatment or other patient management decisions. A negative result may occur with  improper specimen collection/handling, submission of specimen other than nasopharyngeal swab, presence of viral mutation(s) within the areas targeted by this assay,  and inadequate number of viral copies(<138 copies/mL). A negative result must be combined with clinical observations, patient history, and epidemiological information. The expected result is Negative.  Fact Sheet for Patients:  bloggercourse.com  Fact Sheet for Healthcare Providers:  seriousbroker.it  This test is no t yet approved or cleared by the [place]  FDA and  has been authorized for detection and/or diagnosis of SARS-CoV-2 by FDA under an Emergency Use Authorization (EUA). This EUA will remain  in effect (meaning this test can be used) for the duration of the COVID-19 declaration under Section 564(b)(1) of the Act, 21 U.S.C.section 360bbb-3(b)(1), unless the authorization is terminated  or revoked sooner.       Influenza A by PCR NEGATIVE NEGATIVE Final   Influenza B by PCR NEGATIVE NEGATIVE Final    Comment: (NOTE) The Xpert Xpress SARS-CoV-2/FLU/RSV plus assay is intended as an aid in the diagnosis of influenza from  Nasopharyngeal swab specimens and should not be used as a sole basis for treatment. Nasal washings and aspirates are unacceptable for Xpert Xpress SARS-CoV-2/FLU/RSV testing.  Fact Sheet for Patients: bloggercourse.com  Fact Sheet for Healthcare Providers: seriousbroker.it  This test is not yet approved or cleared by the [place]  FDA and has been authorized for detection and/or diagnosis of SARS-CoV-2 by FDA under an Emergency Use Authorization (EUA). This EUA will remain in effect (meaning this test can be used) for the duration of the COVID-19 declaration under Section 564(b)(1) of the Act, 21 U.S.C. section 360bbb-3(b)(1), unless the authorization is terminated or revoked.     Resp Syncytial Virus by PCR NEGATIVE NEGATIVE Final    Comment: (NOTE) Fact Sheet for Patients: bloggercourse.com  Fact Sheet for Healthcare Providers: seriousbroker.it  This test is not yet approved or cleared by the [place]  FDA and has been authorized for detection and/or diagnosis of SARS-CoV-2 by FDA under an Emergency Use Authorization (EUA). This EUA will remain in effect (meaning this test can be used) for the duration of the COVID-19 declaration under Section 564(b)(1) of the Act, 21 U.S.C. section 360bbb-3(b)(1), unless the authorization is terminated or revoked.  Performed at [facility], [address] [place], [place] [address]    *Note: Due to a large number of results and/or encounters for the requested time period, some results have not been displayed. A complete set of results can be found in Results Review.    Labs: CBC: Recent Labs  Lab 04/22/24 1118  WBC 4.1  NEUTROABS 2.6  HGB 8.8*  HCT 28.9*  MCV 102.1*  PLT 290   Basic Metabolic Panel: Recent Labs  Lab 04/22/24 1118 04/23/24 0606  NA 136 141  K 5.4* 3.9  CL 101 110  CO2 24 23  GLUCOSE  438* 47*  BUN 22 23  CREATININE 1.23 0.95  CALCIUM  9.1 8.5*   Liver Function Tests: Recent Labs  Lab 04/22/24 1118  AST 22  ALT 22  ALKPHOS 90  BILITOT 0.6  PROT 8.9*  ALBUMIN 3.2*   CBG: Recent Labs  Lab 04/26/24 0408 04/26/24 0728 04/26/24 0809 04/26/24 1139 04/26/24 1557  GLUCAP 81 37* 80 222* 108*    Discharge time spent: greater than 30 minutes.  Signed: Leita Blanch, MD [facility] 04/26/2024 "

## 2024-04-26 NOTE — Inpatient Diabetes Management (Addendum)
 Inpatient Diabetes Program Recommendations  AACE/ADA: New Consensus Statement on Inpatient Glycemic Control (2015)  Target Ranges:  Prepandial:   less than 140 mg/dL      Peak postprandial:   less than 180 mg/dL (1-2 hours)      Critically ill patients:  140 - 180 mg/dL    Latest Reference Range & Units 04/25/24 07:32 04/25/24 08:00 04/25/24 12:26 04/25/24 16:26 04/25/24 20:32  Glucose-Capillary 70 - 99 mg/dL 44 (LL) 79 674 (H) 622 (H)  5 units Novolog   5 units Semglee  @1813  150 (H)    Latest Reference Range & Units 04/26/24 00:43 04/26/24 02:15 04/26/24 02:33 04/26/24 04:08 04/26/24 07:28  Glucose-Capillary 70 - 99 mg/dL 41 (LL) 69 (L) 59 (L) 81 37 (LL)  (LL): Data is critically low (L): Data is abnormally low    Admit with: Hyperglycemia  History: Type 1 diabetes  Home DM Meds: Insulin  Pump w/ Humalog Insulin        Dexcom G7 CGM  Current Orders: Semglee  5 units daily     Novolog  0-6 units Q4H   New Glucose Sensor placed 04/26/2023 by [name]  11:15am--Went in to talk with pt this AM.  No family/friends at bedside.  Pt able to answer my questions but told me he does NOT do anything with his pump.  His friend [name] helps pt with pump at home (per pt report).  Pt stated he does nothing with the pump.  Has Dexcom G7 CGM on LUA.  Not sure if pt has reader for the Dexcom or uses cell phone to get glucose readings.    Talked with Dr. Tobie on nursing unit at 11:30am.  Alerted Dr. Tobie that pt received 5 units Semglee  this AM at 8:55am.  It is NOT safe to have pt resume insulin  pump due to pt now having 10 units Semglee  on board (5 units given yest at 6pm and 5 units given today at 9am) and the fact that no one is here in the hospital to help him with the pump.  Pt must be independent with pump to use pump in hospital or family/friend responsible for pump must be present at bedside.  Plan to check CBGs later this afternoon and will talk with MD again at that time to make  d/c plan.  Addendum 4pm--CBG 108 at 4pm.  Recommend d/c home today (if able to arrange transport) and have [name] restart pt's insulin  pump tomorrow AM at breakfast time.  Dr. Tobie notified by Secure Chat.      Patient sees Dr. Damian for endocrinology with last office visit 03/27/24. Type 1 diabetes - His last Hb A1c was 9.7% on 01/18/2024. He started on the Tandem T:slim X2 insulin  pump in 06/2023. He is currently using the Tandem pump and a DexCom G7 CGM. Devices were downloaded and reviewed.  Pump settings: Basal 0.25 units/hrCorrection 1:60 Carb ratio 1:20 Target blood glucose 120 24-hr basal is 6 units.    --Will follow patient during hospitalization--  Adina Rudolpho Arrow RN, MSN, CDCES Diabetes Coordinator Inpatient Glycemic Control Team Team Pager: [phone] (8a-5p)

## 2024-04-26 NOTE — TOC Transition Note (Addendum)
 Transition of Care [facility]) - Discharge Note   Patient Details  Name: Nicholas Weiss MRN: [id number] Date of Birth: [birth year]  [facility] CM/SW Contact:  Victory Jackquline GORMAN, RN Phone Number: 04/26/2024, 4:20 PM   Clinical Narrative:    Patient dischargeing home with HHPT/OT/RN. Patient is active with [facility] and will resume services. RNCM called patient's friend [name] @ [phone], notified of patient being discharged and transportation was set up with [facility], spoke with ED and there are 2 people ahead of him. MD and bedside nurse made aware. Pt has discharge orders, no further concerns. RNCM signing off.   Final next level of care: Home w Home Health Services Barriers to Discharge: Barriers Resolved   Patient Goals and CMS Choice            Discharge Placement                Patient to be transferred to facility by: [facility] Name of family member notified: [name] Patient and family notified of of transfer: 04/26/24  Discharge Plan and Services Additional resources added to the After Visit Summary for     Discharge Planning Services: CM Consult                                 Social Drivers of Health (SDOH) Interventions SDOH Screenings   Food Insecurity: No Food Insecurity (04/22/2024)  Housing: Low Risk (04/22/2024)  Transportation Needs: No Transportation Needs (04/22/2024)  Utilities: Not At Risk (04/22/2024)  Depression (PHQ2-9): Low Risk (04/11/2024)  Financial Resource Strain: Low Risk  (10/20/2023)   Received from [facility]  Social Connections: Moderately Isolated (04/22/2024)  Tobacco Use: Low Risk (04/22/2024)  Recent Concern: Tobacco Use - Medium Risk (03/27/2024)   Received from [facility]     Readmission Risk Interventions    02/16/2023    3:56 PM 08/24/2022   10:57 AM 05/20/2022   11:41 AM  Readmission Risk Prevention Plan  Transportation Screening Complete Complete Complete   Medication Review Oceanographer) Complete Complete Complete  PCP or Specialist appointment within 3-5 days of discharge Complete Complete Complete  HRI or Home Care Consult Complete Complete Complete  SW Recovery Care/Counseling Consult Not Complete    Palliative Care Screening Not Applicable  Complete  Skilled Nursing Facility Complete Complete Complete

## 2024-04-26 NOTE — Plan of Care (Signed)

## 2024-04-27 LAB — CULTURE, BLOOD (ROUTINE X 2)
Culture: NO GROWTH
Culture: NO GROWTH
Special Requests: ADEQUATE

## 2024-05-01 ENCOUNTER — Encounter: Admitting: Physician Assistant

## 2024-05-03 ENCOUNTER — Other Ambulatory Visit: Payer: Self-pay | Admitting: Oncology

## 2024-05-03 ENCOUNTER — Encounter: Admitting: Physician Assistant

## 2024-05-03 DIAGNOSIS — C9000 Multiple myeloma not having achieved remission: Secondary | ICD-10-CM

## 2024-05-09 ENCOUNTER — Inpatient Hospital Stay

## 2024-05-09 ENCOUNTER — Inpatient Hospital Stay: Admitting: Oncology

## 2024-05-11 ENCOUNTER — Encounter: Admitting: Physician Assistant

## 2024-05-23 ENCOUNTER — Inpatient Hospital Stay: Admitting: Oncology

## 2024-05-23 ENCOUNTER — Inpatient Hospital Stay
